# Patient Record
Sex: Female | Born: 1985 | Race: White | Hispanic: No | Smoking: Former smoker
Health system: Southern US, Community
[De-identification: ages and names within clinical notes are randomized; demographics above are authoritative.]

## PROBLEM LIST (undated history)

## (undated) ENCOUNTER — Inpatient Hospital Stay (HOSPITAL_COMMUNITY): Payer: Self-pay

## (undated) DIAGNOSIS — F988 Other specified behavioral and emotional disorders with onset usually occurring in childhood and adolescence: Secondary | ICD-10-CM

## (undated) DIAGNOSIS — Z9889 Other specified postprocedural states: Secondary | ICD-10-CM

## (undated) DIAGNOSIS — J45909 Unspecified asthma, uncomplicated: Secondary | ICD-10-CM

## (undated) DIAGNOSIS — F419 Anxiety disorder, unspecified: Secondary | ICD-10-CM

## (undated) DIAGNOSIS — N3010 Interstitial cystitis (chronic) without hematuria: Secondary | ICD-10-CM

## (undated) DIAGNOSIS — N83209 Unspecified ovarian cyst, unspecified side: Secondary | ICD-10-CM

## (undated) DIAGNOSIS — R748 Abnormal levels of other serum enzymes: Secondary | ICD-10-CM

## (undated) DIAGNOSIS — Z5189 Encounter for other specified aftercare: Secondary | ICD-10-CM

## (undated) DIAGNOSIS — F445 Conversion disorder with seizures or convulsions: Secondary | ICD-10-CM

## (undated) DIAGNOSIS — M810 Age-related osteoporosis without current pathological fracture: Secondary | ICD-10-CM

## (undated) DIAGNOSIS — Z8489 Family history of other specified conditions: Secondary | ICD-10-CM

## (undated) DIAGNOSIS — F329 Major depressive disorder, single episode, unspecified: Secondary | ICD-10-CM

## (undated) DIAGNOSIS — G43909 Migraine, unspecified, not intractable, without status migrainosus: Secondary | ICD-10-CM

## (undated) DIAGNOSIS — D509 Iron deficiency anemia, unspecified: Secondary | ICD-10-CM

## (undated) DIAGNOSIS — G629 Polyneuropathy, unspecified: Secondary | ICD-10-CM

## (undated) DIAGNOSIS — K760 Fatty (change of) liver, not elsewhere classified: Secondary | ICD-10-CM

## (undated) DIAGNOSIS — IMO0002 Reserved for concepts with insufficient information to code with codable children: Secondary | ICD-10-CM

## (undated) DIAGNOSIS — R1031 Right lower quadrant pain: Secondary | ICD-10-CM

## (undated) DIAGNOSIS — K589 Irritable bowel syndrome without diarrhea: Secondary | ICD-10-CM

## (undated) DIAGNOSIS — F1111 Opioid abuse, in remission: Secondary | ICD-10-CM

## (undated) DIAGNOSIS — M5432 Sciatica, left side: Secondary | ICD-10-CM

## (undated) DIAGNOSIS — F439 Reaction to severe stress, unspecified: Secondary | ICD-10-CM

## (undated) DIAGNOSIS — I209 Angina pectoris, unspecified: Secondary | ICD-10-CM

## (undated) DIAGNOSIS — R519 Headache, unspecified: Secondary | ICD-10-CM

## (undated) DIAGNOSIS — R011 Cardiac murmur, unspecified: Secondary | ICD-10-CM

## (undated) DIAGNOSIS — M797 Fibromyalgia: Secondary | ICD-10-CM

## (undated) DIAGNOSIS — I499 Cardiac arrhythmia, unspecified: Secondary | ICD-10-CM

## (undated) DIAGNOSIS — E669 Obesity, unspecified: Secondary | ICD-10-CM

## (undated) DIAGNOSIS — Z9884 Bariatric surgery status: Secondary | ICD-10-CM

## (undated) DIAGNOSIS — R569 Unspecified convulsions: Secondary | ICD-10-CM

## (undated) DIAGNOSIS — E876 Hypokalemia: Secondary | ICD-10-CM

## (undated) DIAGNOSIS — G609 Hereditary and idiopathic neuropathy, unspecified: Secondary | ICD-10-CM

## (undated) DIAGNOSIS — M329 Systemic lupus erythematosus, unspecified: Secondary | ICD-10-CM

## (undated) DIAGNOSIS — F32A Depression, unspecified: Secondary | ICD-10-CM

## (undated) DIAGNOSIS — R112 Nausea with vomiting, unspecified: Secondary | ICD-10-CM

## (undated) DIAGNOSIS — K2970 Gastritis, unspecified, without bleeding: Secondary | ICD-10-CM

## (undated) DIAGNOSIS — IMO0001 Reserved for inherently not codable concepts without codable children: Secondary | ICD-10-CM

## (undated) DIAGNOSIS — Z319 Encounter for procreative management, unspecified: Secondary | ICD-10-CM

## (undated) DIAGNOSIS — Z7151 Drug abuse counseling and surveillance of drug abuser: Secondary | ICD-10-CM

## (undated) DIAGNOSIS — D219 Benign neoplasm of connective and other soft tissue, unspecified: Secondary | ICD-10-CM

## (undated) DIAGNOSIS — N946 Dysmenorrhea, unspecified: Secondary | ICD-10-CM

## (undated) DIAGNOSIS — D649 Anemia, unspecified: Secondary | ICD-10-CM

## (undated) DIAGNOSIS — Z3490 Encounter for supervision of normal pregnancy, unspecified, unspecified trimester: Secondary | ICD-10-CM

## (undated) DIAGNOSIS — R51 Headache: Secondary | ICD-10-CM

## (undated) DIAGNOSIS — N920 Excessive and frequent menstruation with regular cycle: Secondary | ICD-10-CM

## (undated) HISTORY — DX: Anxiety disorder, unspecified: F41.9

## (undated) HISTORY — PX: ESOPHAGOGASTRODUODENOSCOPY: SHX1529

## (undated) HISTORY — DX: Encounter for procreative management, unspecified: Z31.9

## (undated) HISTORY — PX: WISDOM TOOTH EXTRACTION: SHX21

## (undated) HISTORY — DX: Sciatica, left side: M54.32

## (undated) HISTORY — DX: Abnormal levels of other serum enzymes: R74.8

## (undated) HISTORY — DX: Hypokalemia: E87.6

## (undated) HISTORY — DX: Irritable bowel syndrome, unspecified: K58.9

## (undated) HISTORY — DX: Other specified behavioral and emotional disorders with onset usually occurring in childhood and adolescence: F98.8

## (undated) HISTORY — DX: Dysmenorrhea, unspecified: N94.6

## (undated) HISTORY — DX: Migraine, unspecified, not intractable, without status migrainosus: G43.909

## (undated) HISTORY — DX: Depression, unspecified: F32.A

## (undated) HISTORY — DX: Polyneuropathy, unspecified: G62.9

## (undated) HISTORY — DX: Obesity, unspecified: E66.9

## (undated) HISTORY — DX: Iron deficiency anemia, unspecified: D50.9

## (undated) HISTORY — DX: Hereditary and idiopathic neuropathy, unspecified: G60.9

## (undated) HISTORY — DX: Reserved for inherently not codable concepts without codable children: IMO0001

## (undated) HISTORY — DX: Fibromyalgia: M79.7

## (undated) HISTORY — PX: COLONOSCOPY: SHX174

## (undated) HISTORY — PX: OTHER SURGICAL HISTORY: SHX169

## (undated) HISTORY — DX: Excessive and frequent menstruation with regular cycle: N92.0

## (undated) HISTORY — DX: Right lower quadrant pain: R10.31

## (undated) HISTORY — DX: Interstitial cystitis (chronic) without hematuria: N30.10

## (undated) HISTORY — DX: Gastritis, unspecified, without bleeding: K29.70

## (undated) HISTORY — PX: UPPER GASTROINTESTINAL ENDOSCOPY: SHX188

## (undated) HISTORY — PX: TONSILLECTOMY AND ADENOIDECTOMY: SUR1326

## (undated) HISTORY — DX: Major depressive disorder, single episode, unspecified: F32.9

## (undated) HISTORY — DX: Fatty (change of) liver, not elsewhere classified: K76.0

## (undated) HISTORY — DX: Encounter for supervision of normal pregnancy, unspecified, unspecified trimester: Z34.90

## (undated) HISTORY — DX: Reaction to severe stress, unspecified: F43.9

## (undated) HISTORY — PX: TONSILLECTOMY: SUR1361

## (undated) HISTORY — PX: DILATION AND CURETTAGE OF UTERUS: SHX78

## (undated) HISTORY — DX: Benign neoplasm of connective and other soft tissue, unspecified: D21.9

## (undated) HISTORY — DX: Anemia, unspecified: D64.9

---
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
## 2006-06-02 ENCOUNTER — Emergency Department (HOSPITAL_COMMUNITY): Admission: EM | Admit: 2006-06-02 | Discharge: 2006-06-02 | Payer: Self-pay | Admitting: Emergency Medicine

## 2006-06-05 HISTORY — PX: GASTRIC BYPASS: SHX52

## 2007-06-16 ENCOUNTER — Encounter: Payer: Self-pay | Admitting: Obstetrics and Gynecology

## 2007-06-16 ENCOUNTER — Ambulatory Visit (HOSPITAL_COMMUNITY): Admission: RE | Admit: 2007-06-16 | Discharge: 2007-06-16 | Payer: Self-pay | Admitting: Obstetrics and Gynecology

## 2007-12-18 ENCOUNTER — Ambulatory Visit (HOSPITAL_BASED_OUTPATIENT_CLINIC_OR_DEPARTMENT_OTHER): Admission: RE | Admit: 2007-12-18 | Discharge: 2007-12-18 | Payer: Self-pay | Admitting: Urology

## 2008-05-21 IMAGING — CT CT PELVIS W/O CM
1 of 2 series · 15 of 32 positions shown, 19 images · non-contrast
Comparison: none

HISTORY: Left-sided abdominal pain

[Series 6134: — · axial · 0.79mm/px · z∈[+1250,+1750]mm · 15 of 110 slices shown, 19 images]
[im 5/110  soft-tissue]
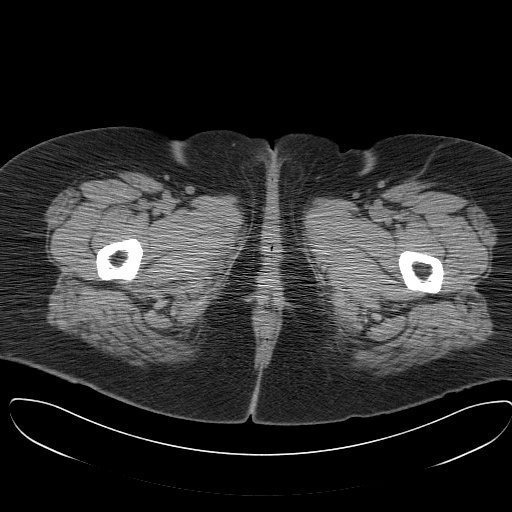
[im 5/110  bone]
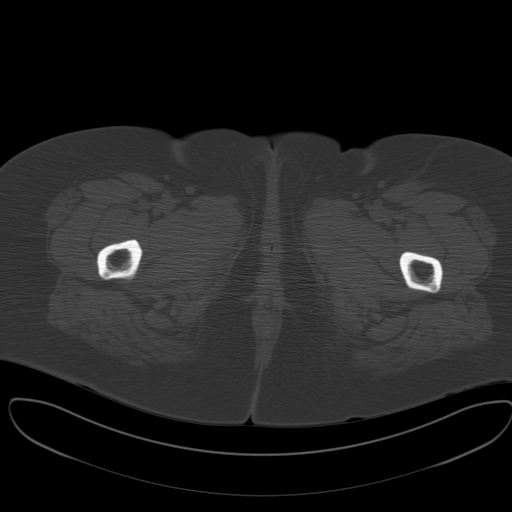
[im 14/110  soft-tissue]
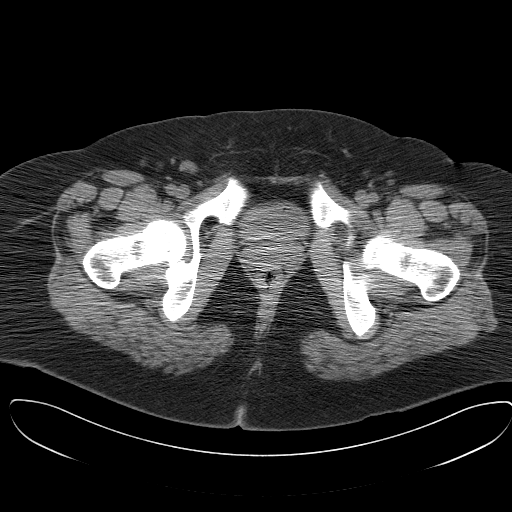
[im 22/110  soft-tissue]
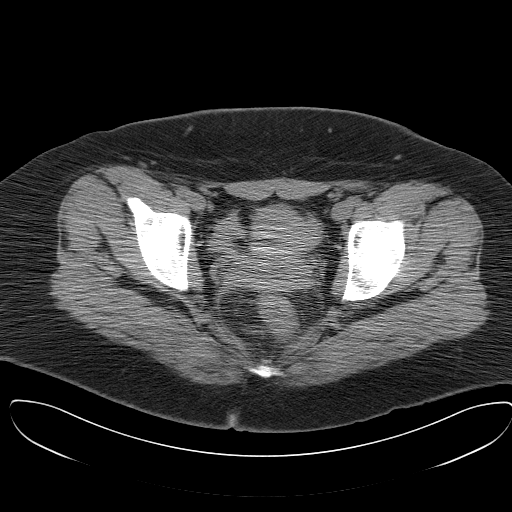
[im 31/110  soft-tissue]
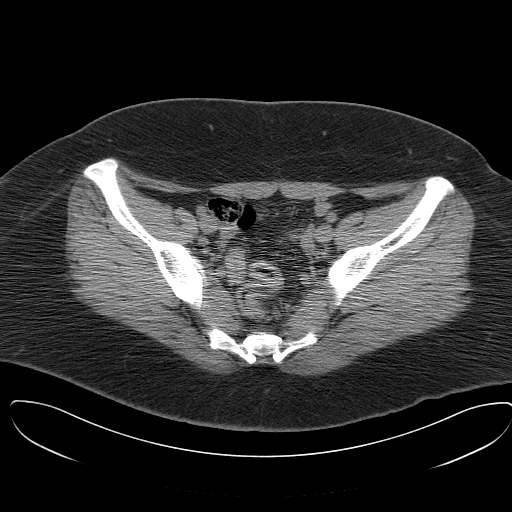
[im 40/110  soft-tissue]
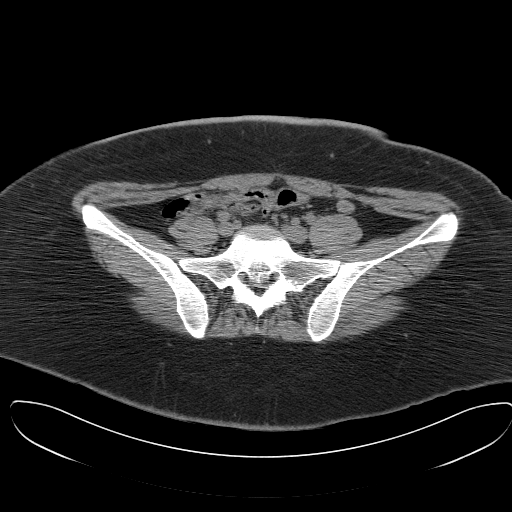
[im 48/110  soft-tissue]
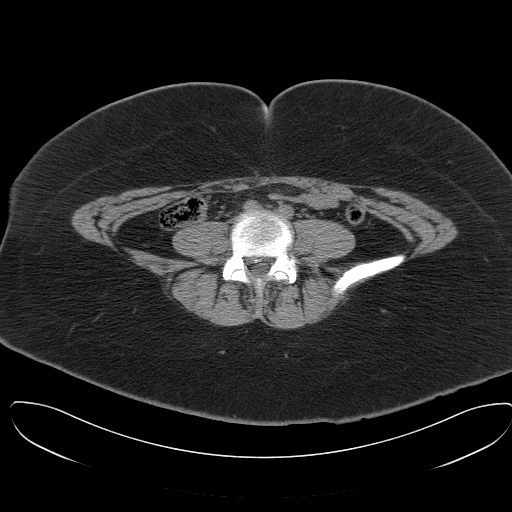
[im 57/110  soft-tissue]
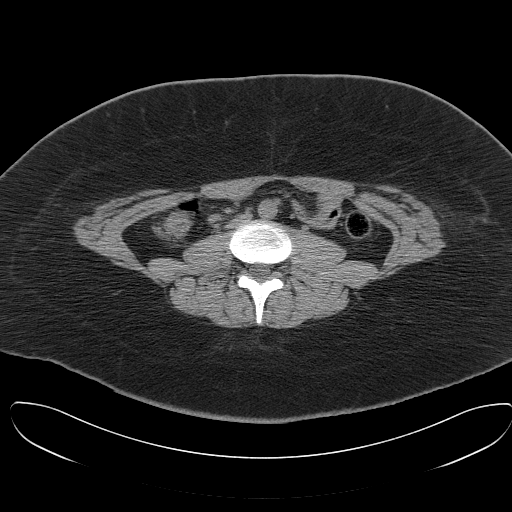
[im 62/110  soft-tissue]
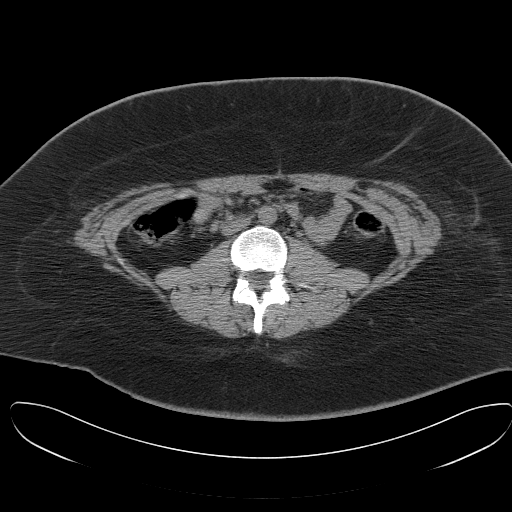
[im 70/110  soft-tissue]
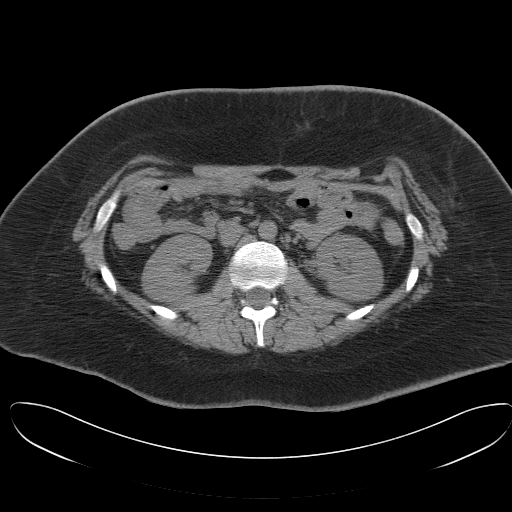
[im 70/110  bone]
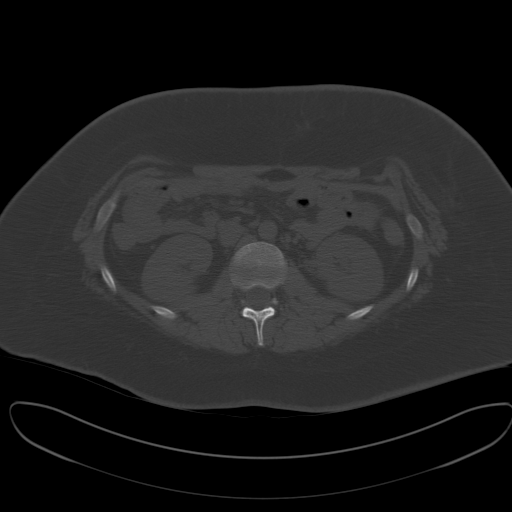
[im 79/110  soft-tissue]
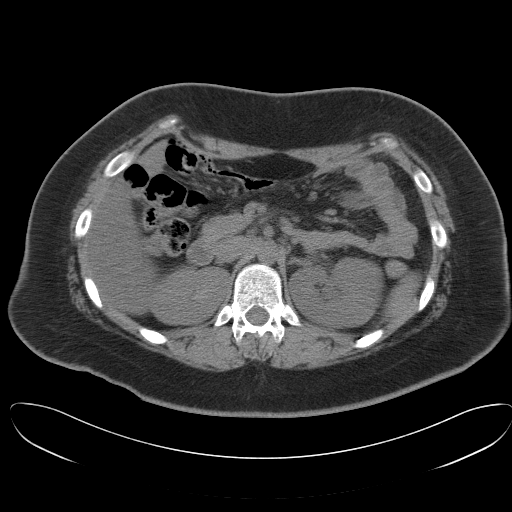
[im 88/110  soft-tissue]
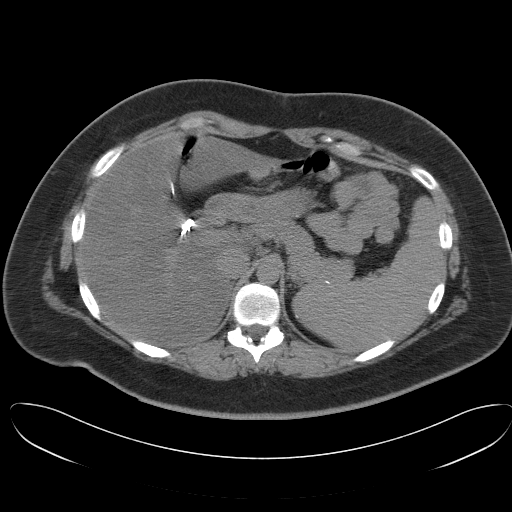
[im 92/110  lung]
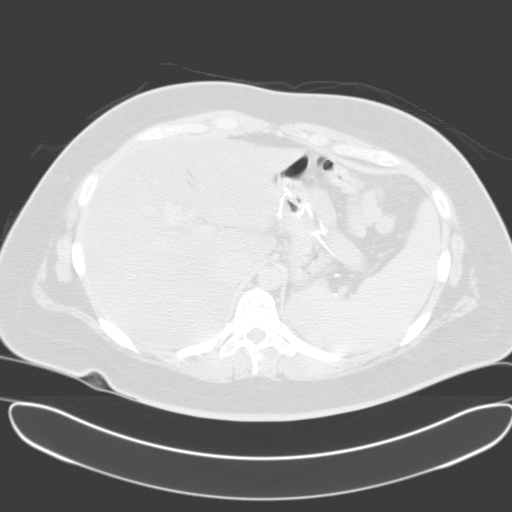
[im 96/110  soft-tissue]
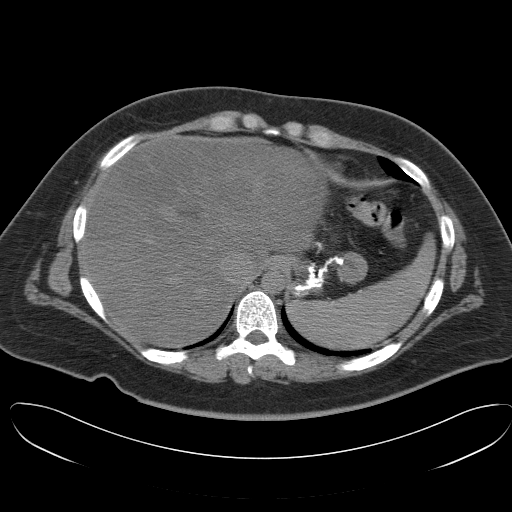
[im 96/110  lung]
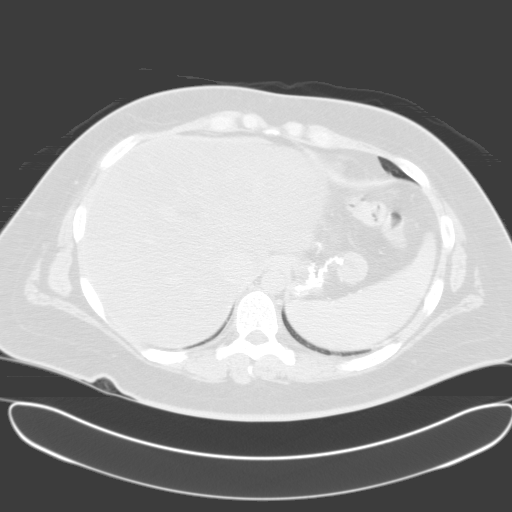
[im 101/110  lung]
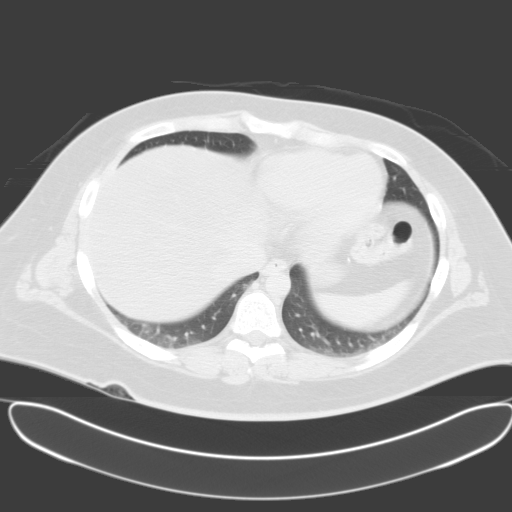
[im 105/110  soft-tissue]
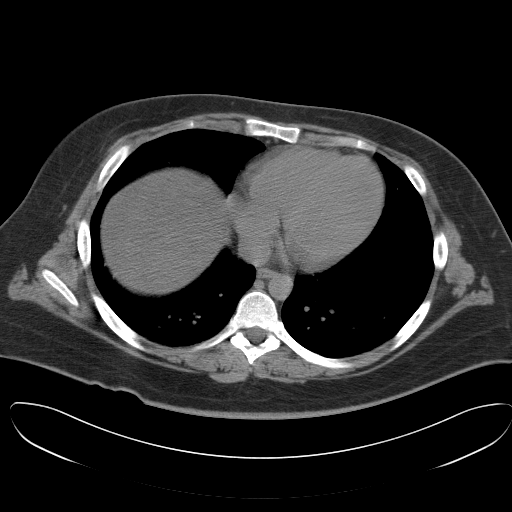
[im 105/110  lung]
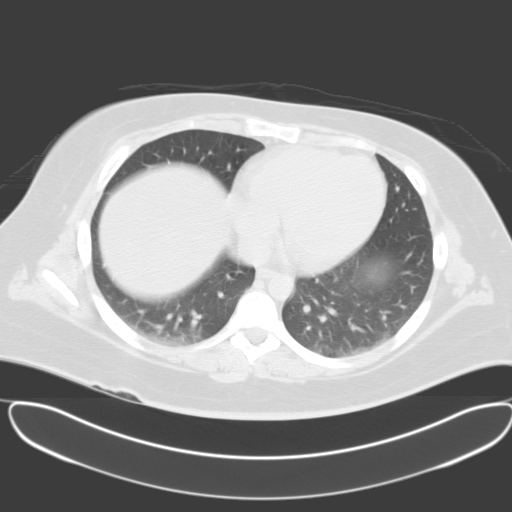

[15 of 32 positions shown; findings below may reference images not displayed]

CT ABDOMEN AND PELVIS WITHOUT CONTRAST:

Multidetector helical CT imaging of abdomen and pelvis performed using kidney
stone protocol. 
Neither oral nor intravenous contrast utilized for this indication.
No prior exam for comparison.

CT ABDOMEN:

Minimal dependent density lung bases.
Status post cholecystectomy and gastric surgery, by history gastric bypass February 2006.
No calculi in either kidney.
Mild left hydronephrosis, left renal enlargement, and mild left perinephric
edema present.
No ureteral calcification seen.
Mild fatty infiltration of liver.
Spleen minimally prominent, 14.6 x 6.9 cm in greatest axial dimensions and
extending for 12 cm in length.
Central low attenuation liver, poorly defined, approximately 2.7 x 1.7 cm image
17, measuring less than 0 Hounsfield units, consistent with more severe focal
fatty infiltration.
Pancreas, adrenal glands, and right kidney unremarkable.
No upper abdominal mass or additional inflammatory process.
Numerous mildly prominent lymph nodes seen throughout mesentery in upper mid
abdomen, none pathologically enlarged.
No ascites or focal bone lesion.
IMPRESSION: Mild left renal enlargement with mild hydronephrosis and minimal peripelvic
edema suggesting obstruction though no definite calculus visualized.
Correlation with urinalysis recommended.
Fatty infiltration of liver with more focal area of central fatty deposition
near porta hepatis.
Status post cholecystectomy and gastric bypass.
Multiple lymph nodes throughout mesentery, normal in size but prominent in
number.

CT PELVIS:

No definite distal ureteral calcification or dilatation.
Appendix not localized.
Normal size and appearance of uterus and ovaries by CT.
No pelvic mass, adenopathy, or free fluid.
Bones unremarkable.
IMPRESSION: No acute pelvic abnormalities. 
Specifically, no distal ureteral calcification identified to account for mild
left hydronephrosis visualized on CT abdomen.
Possibility of passed stone or urinary tract infection raised.

## 2008-05-30 ENCOUNTER — Other Ambulatory Visit: Admission: RE | Admit: 2008-05-30 | Discharge: 2008-05-30 | Payer: Self-pay | Admitting: Obstetrics and Gynecology

## 2008-09-14 ENCOUNTER — Ambulatory Visit: Payer: Self-pay | Admitting: Obstetrics & Gynecology

## 2008-09-14 ENCOUNTER — Inpatient Hospital Stay (HOSPITAL_COMMUNITY): Admission: AD | Admit: 2008-09-14 | Discharge: 2008-09-15 | Payer: Self-pay | Admitting: Obstetrics & Gynecology

## 2008-09-24 ENCOUNTER — Encounter (HOSPITAL_COMMUNITY): Admission: RE | Admit: 2008-09-24 | Discharge: 2008-10-24 | Payer: Self-pay | Admitting: Obstetrics and Gynecology

## 2008-09-24 ENCOUNTER — Ambulatory Visit (HOSPITAL_COMMUNITY): Payer: Self-pay | Admitting: Obstetrics and Gynecology

## 2008-11-14 ENCOUNTER — Ambulatory Visit (HOSPITAL_COMMUNITY): Admission: RE | Admit: 2008-11-14 | Discharge: 2008-11-14 | Payer: Self-pay | Admitting: Obstetrics and Gynecology

## 2008-12-06 ENCOUNTER — Ambulatory Visit: Payer: Self-pay | Admitting: Obstetrics and Gynecology

## 2008-12-06 ENCOUNTER — Inpatient Hospital Stay (HOSPITAL_COMMUNITY): Admission: AD | Admit: 2008-12-06 | Discharge: 2008-12-06 | Payer: Self-pay | Admitting: Obstetrics & Gynecology

## 2008-12-21 ENCOUNTER — Ambulatory Visit: Payer: Self-pay | Admitting: Family Medicine

## 2008-12-31 ENCOUNTER — Inpatient Hospital Stay (HOSPITAL_COMMUNITY): Admission: AD | Admit: 2008-12-31 | Discharge: 2009-01-03 | Payer: Self-pay | Admitting: Obstetrics & Gynecology

## 2008-12-31 ENCOUNTER — Ambulatory Visit: Payer: Self-pay | Admitting: Family Medicine

## 2009-01-06 DIAGNOSIS — IMO0001 Reserved for inherently not codable concepts without codable children: Secondary | ICD-10-CM

## 2009-01-06 HISTORY — DX: Reserved for inherently not codable concepts without codable children: IMO0001

## 2009-06-29 ENCOUNTER — Emergency Department (HOSPITAL_COMMUNITY): Admission: EM | Admit: 2009-06-29 | Discharge: 2009-06-29 | Payer: Self-pay | Admitting: Emergency Medicine

## 2009-07-07 ENCOUNTER — Ambulatory Visit (HOSPITAL_COMMUNITY): Payer: Self-pay | Admitting: Family Medicine

## 2009-07-07 ENCOUNTER — Encounter (HOSPITAL_COMMUNITY): Admission: RE | Admit: 2009-07-07 | Discharge: 2009-08-06 | Payer: Self-pay | Admitting: Family Medicine

## 2009-10-19 ENCOUNTER — Emergency Department (HOSPITAL_COMMUNITY): Admission: EM | Admit: 2009-10-19 | Discharge: 2009-10-20 | Payer: Self-pay | Admitting: Emergency Medicine

## 2009-10-27 ENCOUNTER — Ambulatory Visit (HOSPITAL_COMMUNITY): Admission: RE | Admit: 2009-10-27 | Discharge: 2009-10-27 | Payer: Self-pay | Admitting: Obstetrics & Gynecology

## 2009-11-30 ENCOUNTER — Emergency Department (HOSPITAL_COMMUNITY): Admission: EM | Admit: 2009-11-30 | Discharge: 2009-11-30 | Payer: Self-pay | Admitting: Emergency Medicine

## 2009-12-06 DIAGNOSIS — D649 Anemia, unspecified: Secondary | ICD-10-CM

## 2009-12-06 HISTORY — DX: Anemia, unspecified: D64.9

## 2009-12-17 IMAGING — US US TRANSVAGINAL NON-OB
1 series · 14 of 25 positions shown · non-contrast
Comparison: CT 06/02/2006.

October 28, 2009 –DUPLICATE COPY for exam association in [facility]. No change from original report.
CLINICAL DATA: Pelvic pain.

 TRANSABDOMINAL AND TRANSVAGINAL ULTRASOUND OF PELVIS
TECHNIQUE: Both transabdominal and transvaginal ultrasound
 examinations of the pelvis were performed including evaluation of
 the uterus, ovaries, adnexal regions, and pelvic cul-de-sac.

[Series 1: us transvaginal non-ob · 0.22mm/px · 14 of 62 slices shown]
[im 1/62]
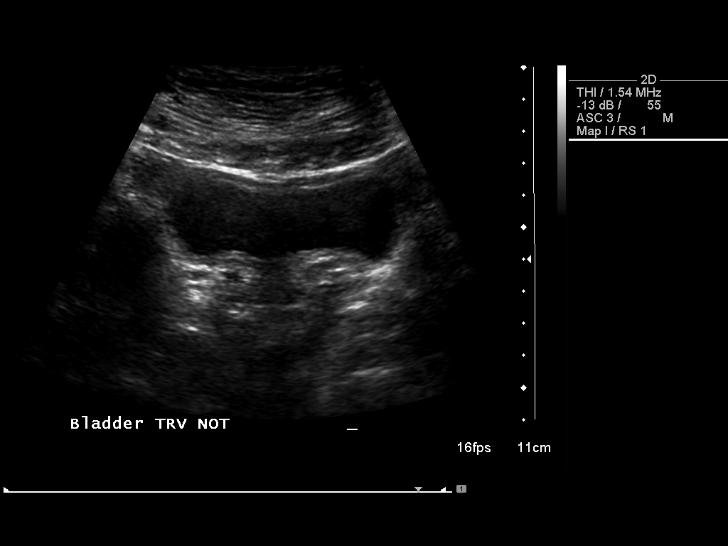
[im 6/62]
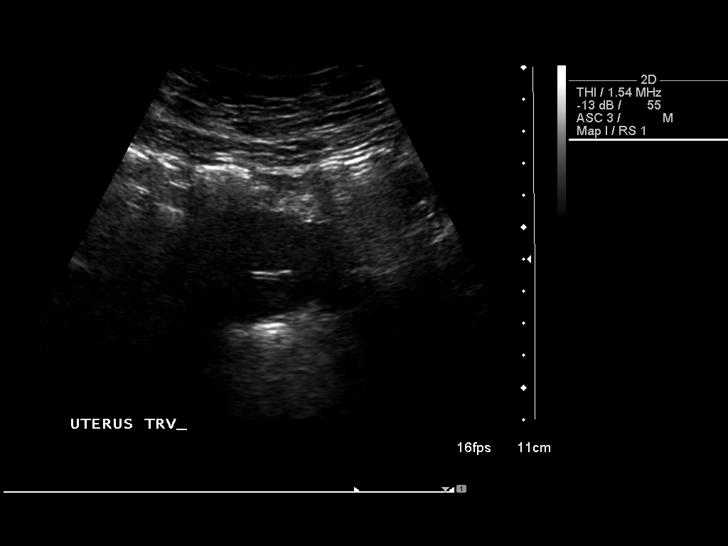
[im 11/62]
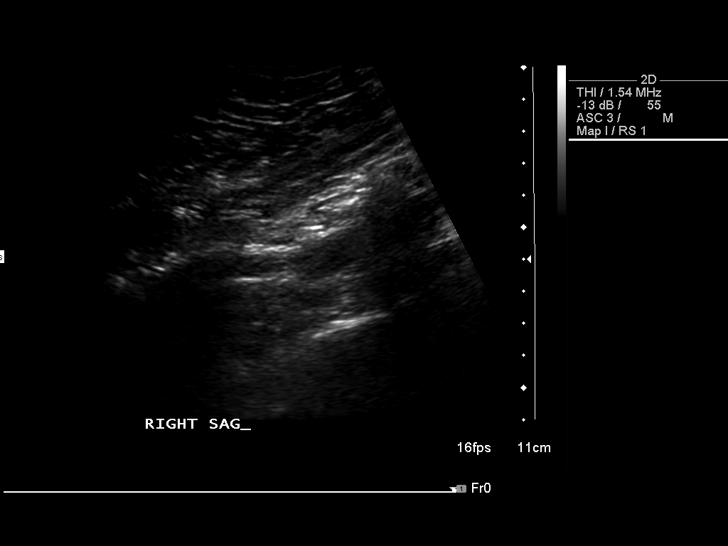
[im 16/62]
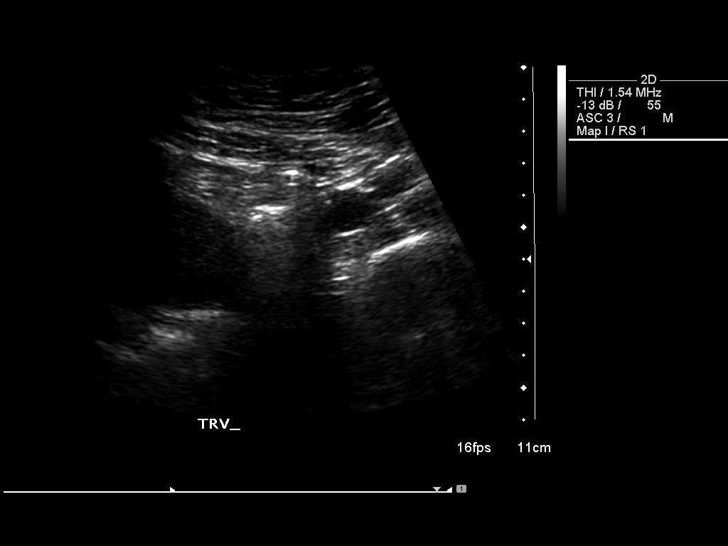
[im 21/62]
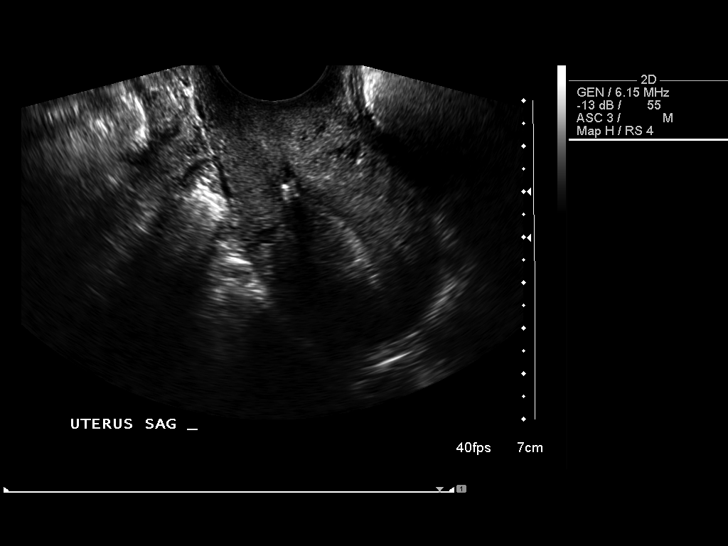
[im 23/62]
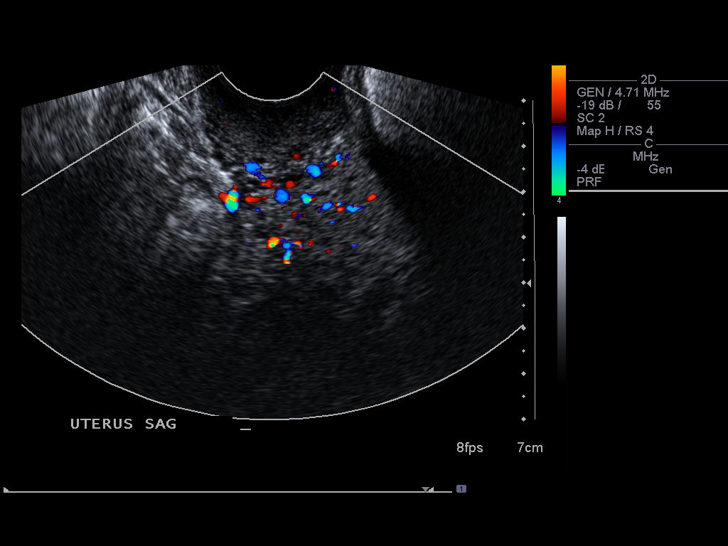
[im 28/62]
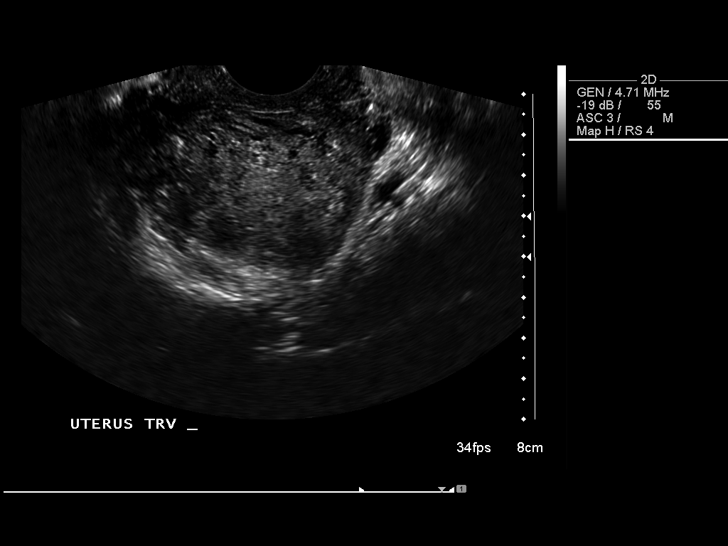
[im 34/62]
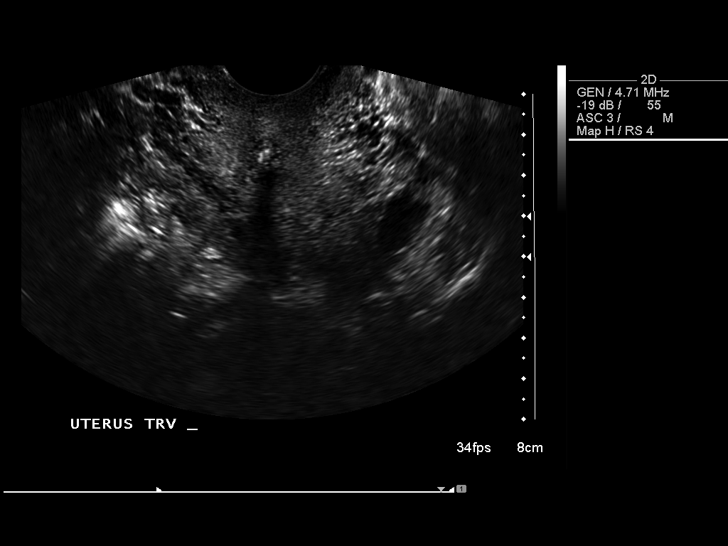
[im 39/62]
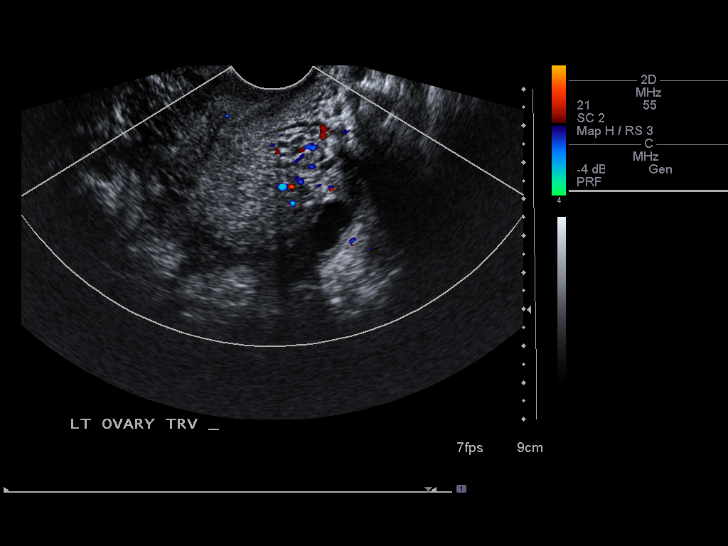
[im 41/62]
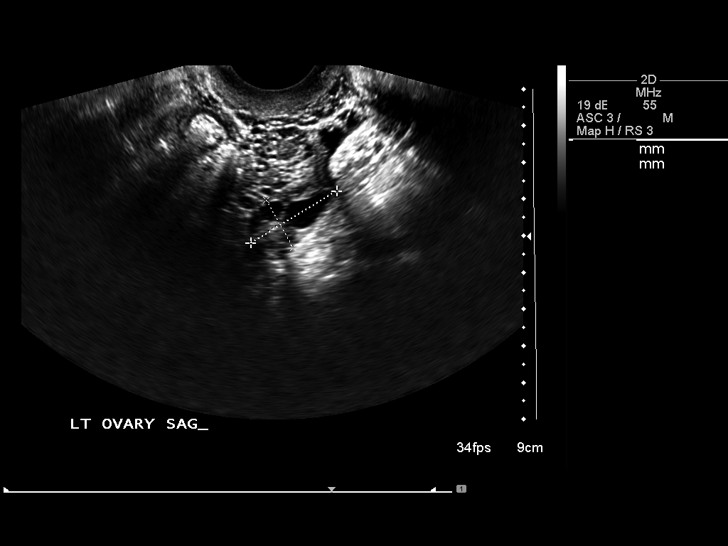
[im 46/62]
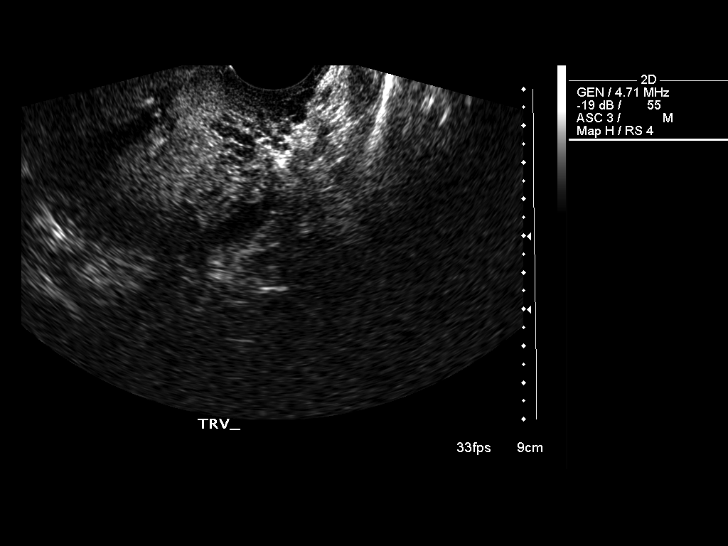
[im 51/62]
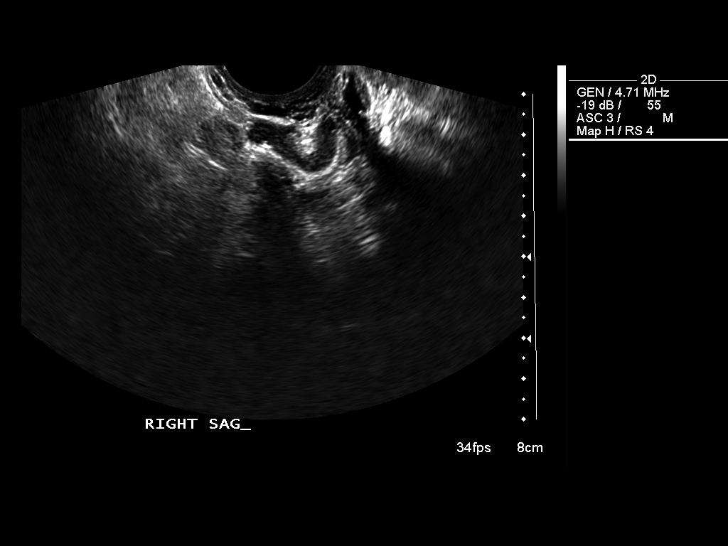
[im 56/62]
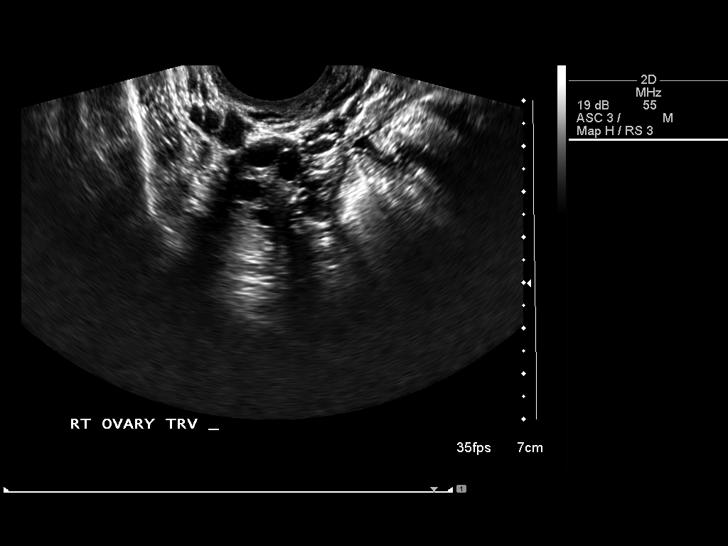
[im 62/62]
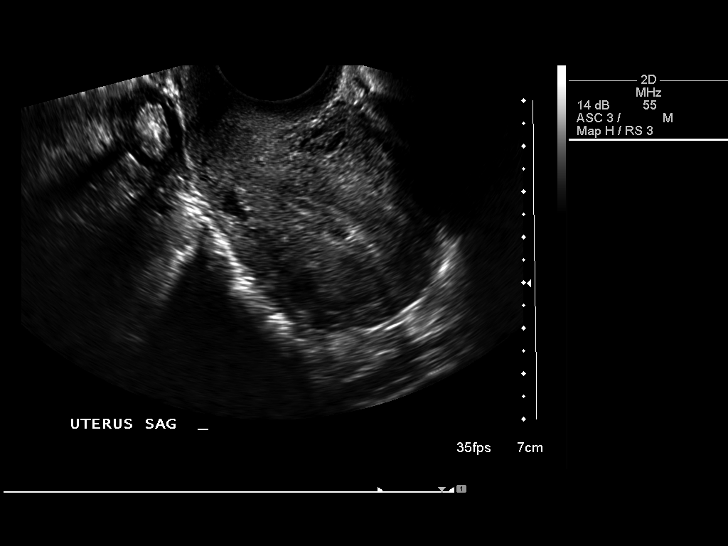

[14 of 25 positions shown; findings below may reference images not displayed]

FINDINGS: Uterus retroverted uterus measures 7.0 x 3.4 x 4.5 cm. IUD is
 noted and shadows within the uterine fundus.

 Endometrium 7 mm in diameter without focal mass.

 Right Ovary 3.1 x 1.9 x 1.7 cm with multiple follicles. No mass
 lesion.

 Left Ovary 2.7 x 1.5 x 1.7 cm with follicles. Negative for mass.

 Other Findings: No free fluid.
IMPRESSION: IUD is present. There is no mass or acute abnormality.

## 2010-01-07 ENCOUNTER — Emergency Department (HOSPITAL_COMMUNITY): Admission: EM | Admit: 2010-01-07 | Discharge: 2010-01-07 | Payer: Self-pay | Admitting: Emergency Medicine

## 2010-01-09 ENCOUNTER — Ambulatory Visit (HOSPITAL_COMMUNITY): Admission: RE | Admit: 2010-01-09 | Discharge: 2010-01-09 | Payer: Self-pay | Admitting: Family Medicine

## 2010-05-18 ENCOUNTER — Emergency Department (HOSPITAL_COMMUNITY): Admission: EM | Admit: 2010-05-18 | Discharge: 2010-05-18 | Payer: Self-pay | Admitting: Emergency Medicine

## 2010-06-03 ENCOUNTER — Emergency Department (HOSPITAL_COMMUNITY): Admission: EM | Admit: 2010-06-03 | Discharge: 2010-06-04 | Payer: Self-pay | Admitting: Emergency Medicine

## 2010-06-05 DIAGNOSIS — K2970 Gastritis, unspecified, without bleeding: Secondary | ICD-10-CM

## 2010-06-05 HISTORY — DX: Gastritis, unspecified, without bleeding: K29.70

## 2010-06-11 ENCOUNTER — Emergency Department (HOSPITAL_COMMUNITY): Admission: EM | Admit: 2010-06-11 | Discharge: 2010-06-11 | Payer: Self-pay | Admitting: Emergency Medicine

## 2010-06-12 ENCOUNTER — Encounter (HOSPITAL_COMMUNITY): Admission: RE | Admit: 2010-06-12 | Discharge: 2010-07-12 | Payer: Self-pay | Admitting: Oncology

## 2010-07-01 ENCOUNTER — Observation Stay (HOSPITAL_COMMUNITY): Admission: EM | Admit: 2010-07-01 | Discharge: 2010-07-02 | Payer: Self-pay | Admitting: Emergency Medicine

## 2010-07-01 ENCOUNTER — Ambulatory Visit: Payer: Self-pay | Admitting: Gastroenterology

## 2010-07-02 ENCOUNTER — Ambulatory Visit: Payer: Self-pay | Admitting: Gastroenterology

## 2010-07-02 ENCOUNTER — Encounter (INDEPENDENT_AMBULATORY_CARE_PROVIDER_SITE_OTHER): Payer: Self-pay | Admitting: Internal Medicine

## 2010-07-08 ENCOUNTER — Telehealth: Payer: Self-pay | Admitting: Gastroenterology

## 2010-07-09 ENCOUNTER — Encounter: Payer: Self-pay | Admitting: Gastroenterology

## 2010-07-17 ENCOUNTER — Encounter: Payer: Self-pay | Admitting: Gastroenterology

## 2010-07-20 ENCOUNTER — Encounter: Payer: Self-pay | Admitting: Gastroenterology

## 2010-07-20 ENCOUNTER — Emergency Department (HOSPITAL_COMMUNITY): Admission: EM | Admit: 2010-07-20 | Discharge: 2010-07-20 | Payer: Self-pay | Admitting: Emergency Medicine

## 2010-07-21 ENCOUNTER — Emergency Department (HOSPITAL_COMMUNITY): Admission: EM | Admit: 2010-07-21 | Discharge: 2010-07-21 | Payer: Self-pay | Admitting: Emergency Medicine

## 2010-07-23 ENCOUNTER — Encounter: Payer: Self-pay | Admitting: Gastroenterology

## 2010-07-23 DIAGNOSIS — D509 Iron deficiency anemia, unspecified: Secondary | ICD-10-CM

## 2010-07-23 HISTORY — DX: Iron deficiency anemia, unspecified: D50.9

## 2010-07-28 ENCOUNTER — Ambulatory Visit (HOSPITAL_COMMUNITY): Admission: RE | Admit: 2010-07-28 | Discharge: 2010-07-28 | Payer: Self-pay | Admitting: Family Medicine

## 2010-08-20 IMAGING — CR DG CHEST 1V
1 series · 1 of 1 positions shown · non-contrast
Comparison: None

CLINICAL DATA: Unresponsive.  Altered mental status.

CHEST - 1 VIEW

[view not recorded]
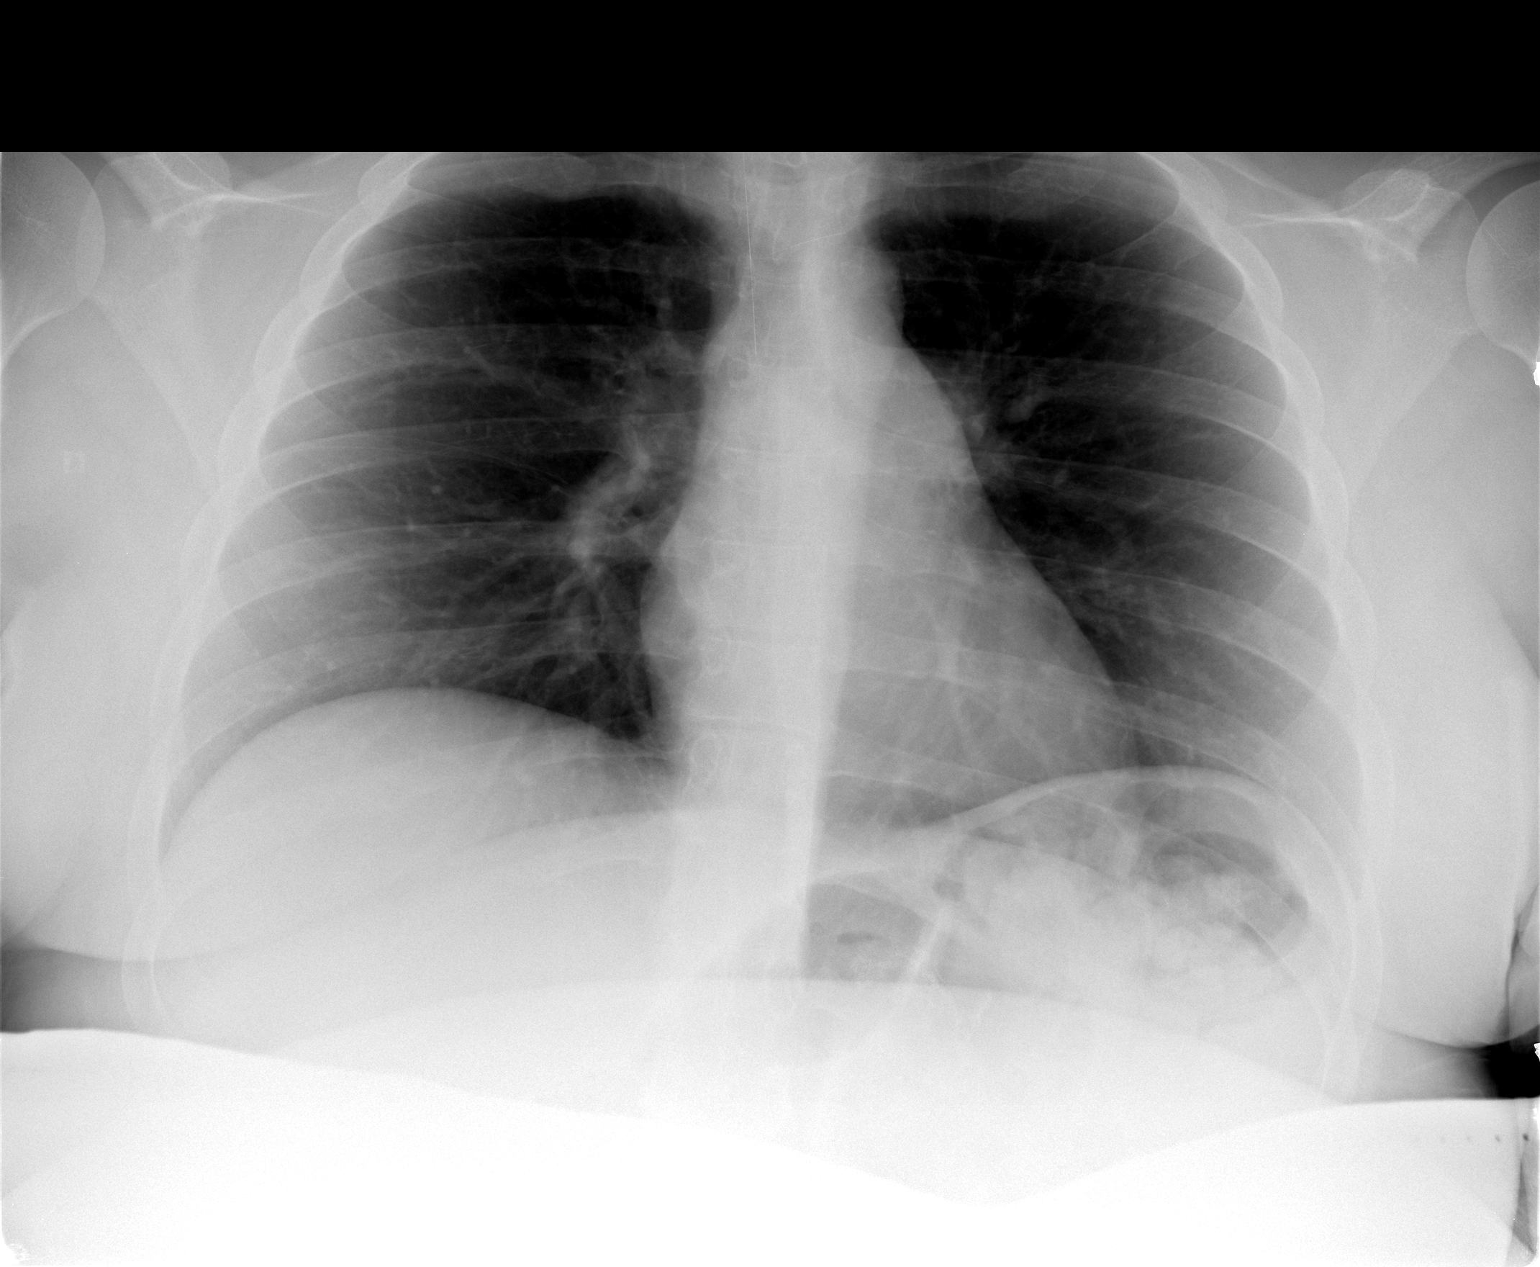

[1 of 1 positions shown; findings below may reference images not displayed]

FINDINGS: Heart size is normal.  Mediastinal shadows are normal.
Lungs are clear.  The vascularity is normal.  No effusions.  No
bony abnormalities.
IMPRESSION: No active disease

## 2010-08-21 IMAGING — MR MR ABDOMEN WO/W CM
9 of 15 series · 23 of 48 positions shown · IV contrast (multihance)
Comparison: CT abdomen 05/25/2006, ultrasound abdomen 07/01/2010.

CLINICAL DATA: Elevated liver enzymes.  Question renal lesion.

MRI ABDOMEN WITH AND WITHOUT CONTRAST,WORKSTATION 3D RECONSTRUCTION
TECHNIQUE: Multiplanar multisequence MR imaging of the abdomen was
performed both before and after administration of intravenous
contrast.,
Contrast: 20 ml multihance

[Series 2: coronal haste · coronal · 5.0mm · 0.78mm/px · 2 of 35 slices shown]
[im 1/35]
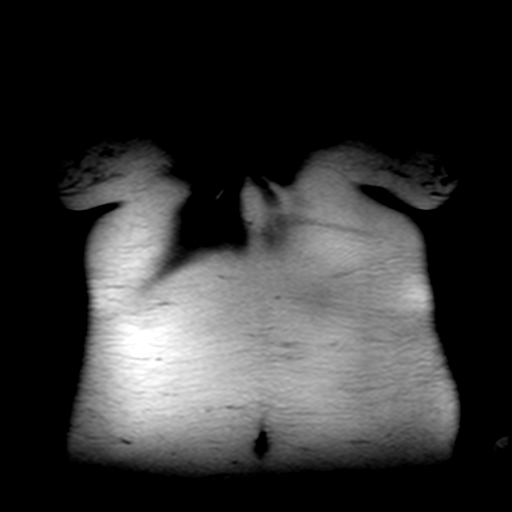
[im 35/35]
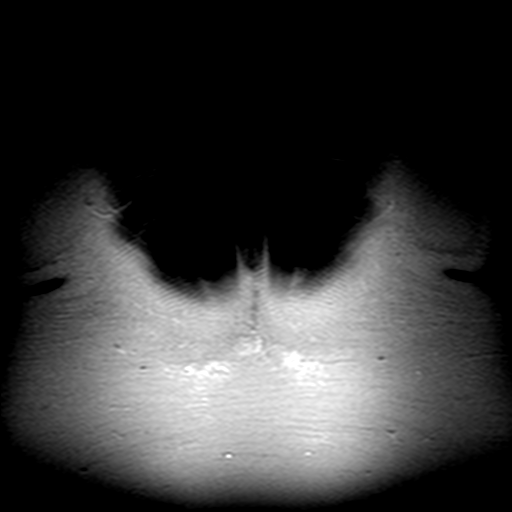

[Series 3: axial haste · axial · 5.0mm · 0.74mm/px · z∈[-46,+213]mm · 2 of 48 slices shown]
[im 1/48]
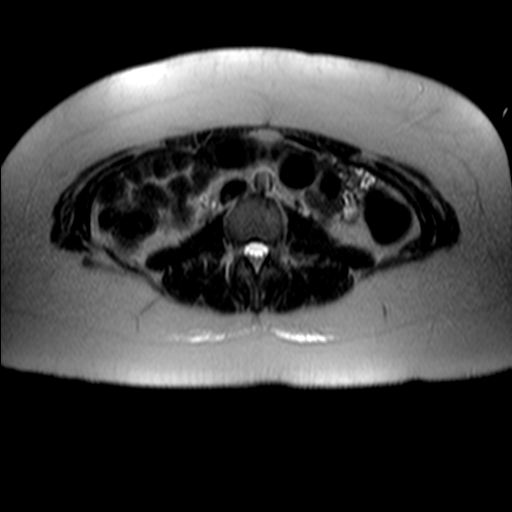
[im 48/48]
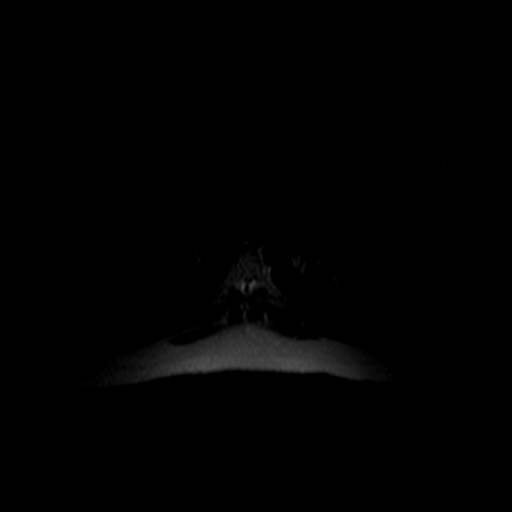

[Series 4: coronal ir · coronal · 5.0mm · 0.70mm/px · 1 of 30 slices shown]
[im 1/30]
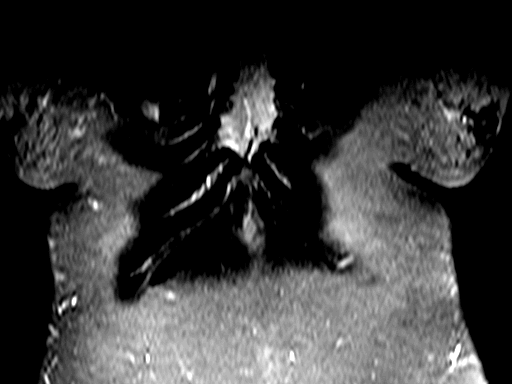

[Series 5: t2fs tse axial · axial · 4.0mm · 0.74mm/px · z∈[-47,+213]mm · 2 of 51 slices shown]
[im 1/51]
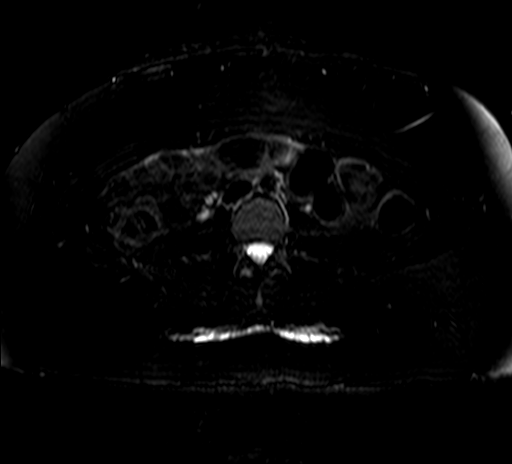
[im 51/51]
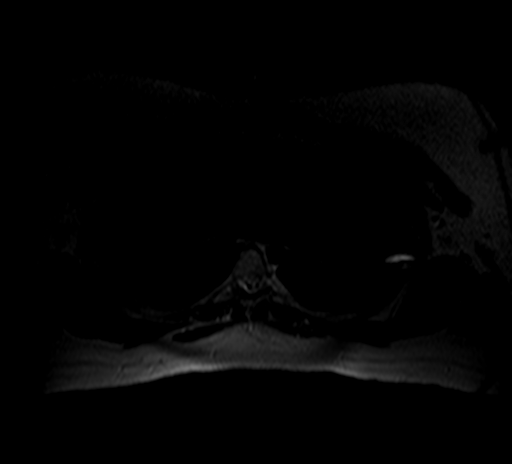

[Series 6: T1 · axial · 6.0mm · 0.55mm/px · z∈[-50,+221]mm · 3 of 80 slices shown]
[im 1/80]
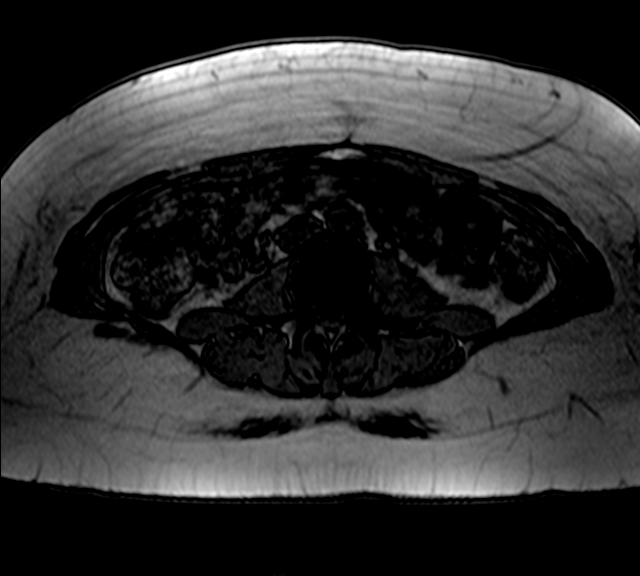
[im 40/80]
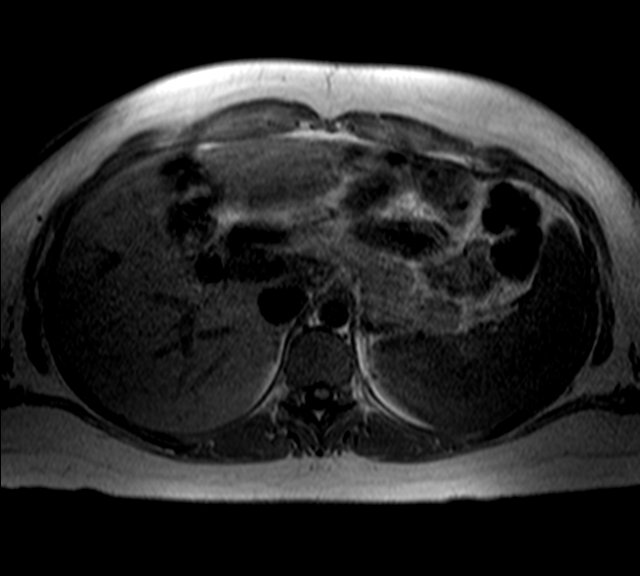
[im 80/80]
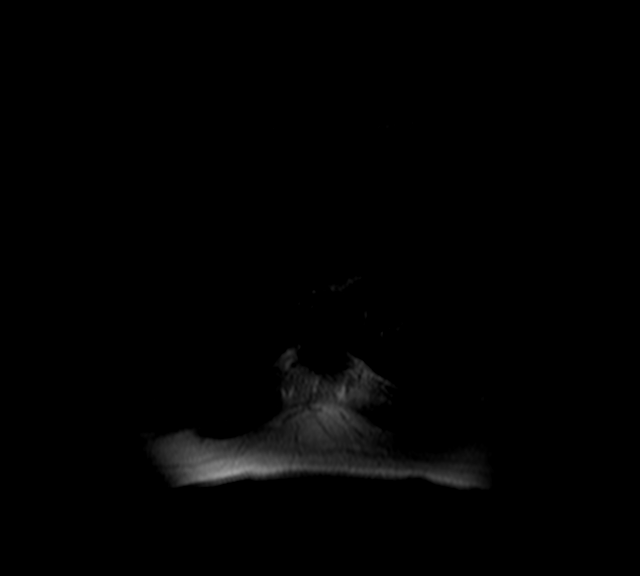

[Series 13: dyn post 23s · axial · 2.0mm · 0.78mm/px · z∈[-43,+211]mm · 4 of 128 slices shown]
[im 1/128]
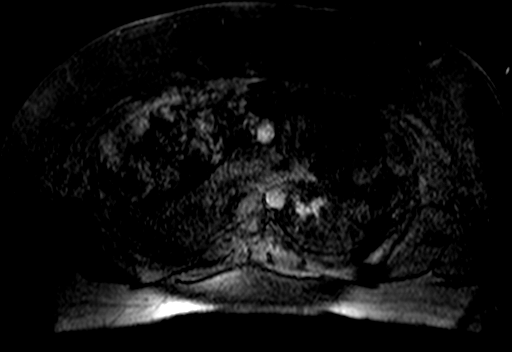
[im 43/128]
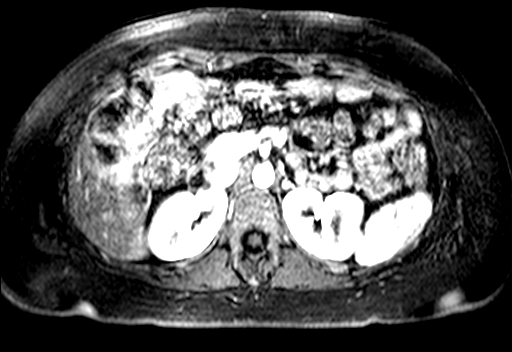
[im 85/128]
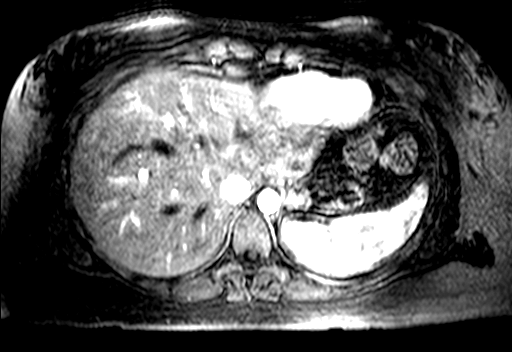
[im 128/128]
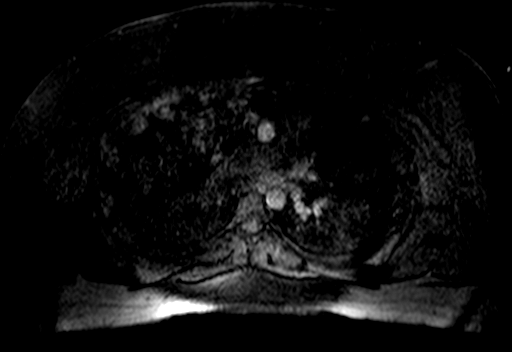

[Series 14: dyn post 45s · axial · 2.0mm · 0.78mm/px · z∈[-43,+211]mm · 4 of 128 slices shown]
[im 1/128]
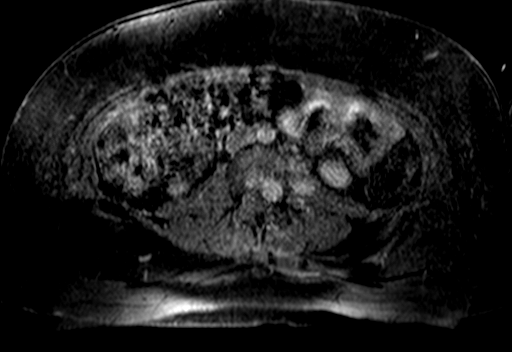
[im 43/128]
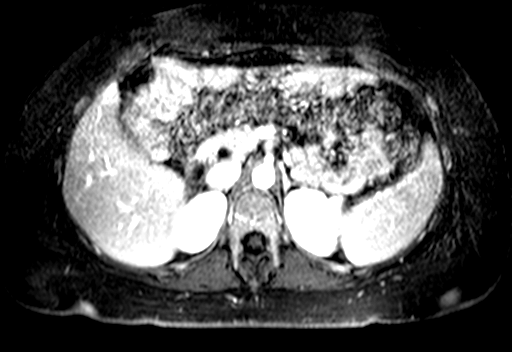
[im 85/128]
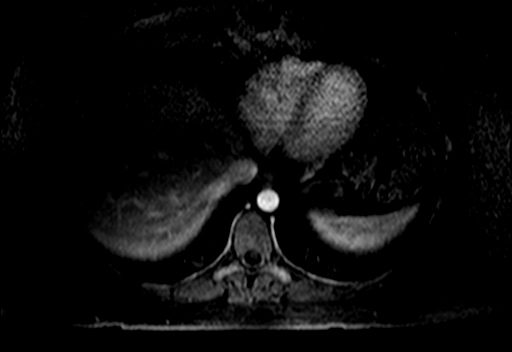
[im 128/128]
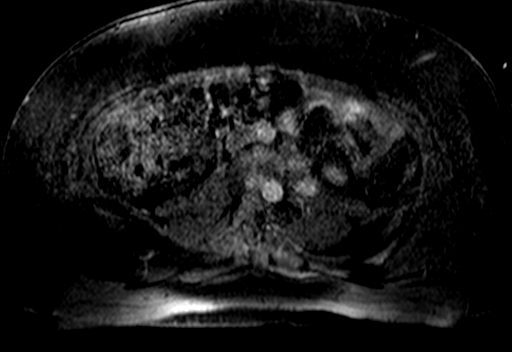

[Series 15: dyn post 90s · axial · 2.0mm · 0.78mm/px · z∈[-43,+211]mm · 4 of 128 slices shown]
[im 1/128]
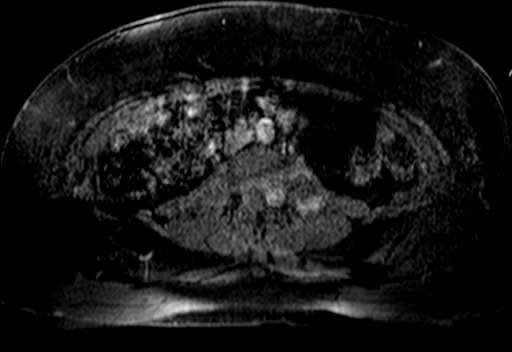
[im 43/128]
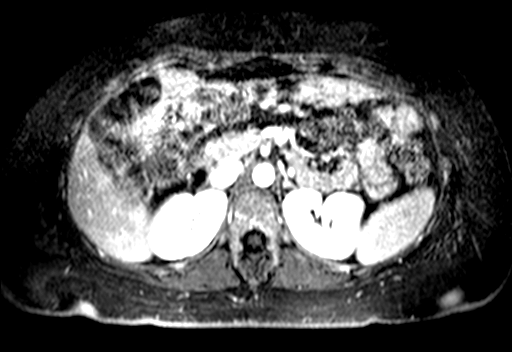
[im 85/128]
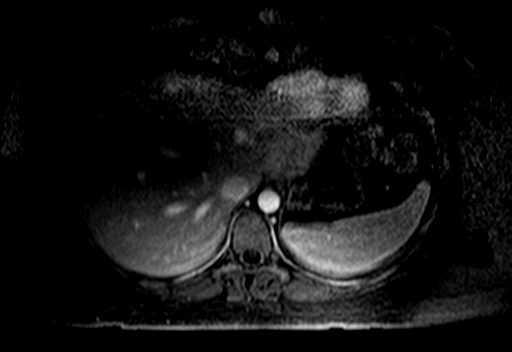
[im 128/128]
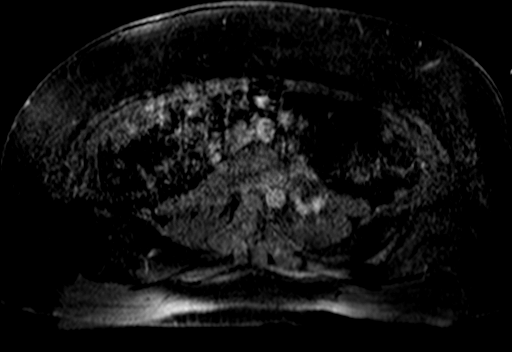

[Series 16: dyn post 3min · axial · 2.0mm · 0.78mm/px · 1 of 128 slices shown]
[im 1/128]
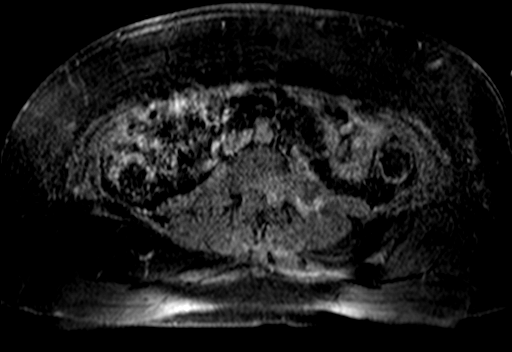

[23 of 48 positions shown; findings below may reference images not displayed]

FINDINGS: No evidence of pleural fluid or pericardial fluid.

No focal hepatic lesion.  No evidence ductal dilatation.  There is
minimal fatty infiltration of the liver.  MRCP imaging demonstrates
no intrahepatic ductal dilatation.  The common bile duct appears
normal without evidence of filling defect or stricture.  The
pancreatic duct is normal.

The pancreas is normal.  The spleen and adrenal glands appear
normal.  There is no lesion within the right kidney to correspond
to the ultrasound finding.  This may represent renal sinus fat.  No
abdominal lymphadenopathy.
IMPRESSION: 1.  No significant abnormality within the abdomen by MRI.
2.  Minimal fatty infiltration of the liver.
3.  No right renal lesion identified.

## 2010-09-03 ENCOUNTER — Encounter (INDEPENDENT_AMBULATORY_CARE_PROVIDER_SITE_OTHER): Payer: Self-pay | Admitting: *Deleted

## 2010-09-13 ENCOUNTER — Emergency Department (HOSPITAL_COMMUNITY): Admission: EM | Admit: 2010-09-13 | Discharge: 2010-09-13 | Payer: Self-pay | Admitting: Emergency Medicine

## 2010-09-17 ENCOUNTER — Ambulatory Visit: Payer: Self-pay | Admitting: Gastroenterology

## 2010-09-17 DIAGNOSIS — R7401 Elevation of levels of liver transaminase levels: Secondary | ICD-10-CM | POA: Insufficient documentation

## 2010-09-17 DIAGNOSIS — R740 Nonspecific elevation of levels of transaminase and lactic acid dehydrogenase [LDH]: Secondary | ICD-10-CM

## 2010-09-17 DIAGNOSIS — E669 Obesity, unspecified: Secondary | ICD-10-CM | POA: Insufficient documentation

## 2010-09-18 ENCOUNTER — Telehealth (INDEPENDENT_AMBULATORY_CARE_PROVIDER_SITE_OTHER): Payer: Self-pay

## 2010-10-09 ENCOUNTER — Emergency Department (HOSPITAL_COMMUNITY): Admission: EM | Admit: 2010-10-09 | Discharge: 2010-10-09 | Payer: Self-pay | Admitting: Emergency Medicine

## 2010-10-23 ENCOUNTER — Emergency Department (HOSPITAL_COMMUNITY): Admission: EM | Admit: 2010-10-23 | Discharge: 2010-10-24 | Payer: Self-pay | Admitting: Emergency Medicine

## 2010-10-23 ENCOUNTER — Emergency Department (HOSPITAL_COMMUNITY): Admission: EM | Admit: 2010-10-23 | Discharge: 2010-10-23 | Payer: Self-pay | Admitting: Emergency Medicine

## 2010-10-29 ENCOUNTER — Emergency Department (HOSPITAL_COMMUNITY): Admission: EM | Admit: 2010-10-29 | Discharge: 2010-10-29 | Payer: Self-pay | Admitting: Emergency Medicine

## 2010-11-30 ENCOUNTER — Emergency Department (HOSPITAL_COMMUNITY)
Admission: EM | Admit: 2010-11-30 | Discharge: 2010-12-01 | Payer: Self-pay | Source: Home / Self Care | Admitting: Emergency Medicine

## 2010-12-17 ENCOUNTER — Emergency Department (HOSPITAL_COMMUNITY)
Admission: EM | Admit: 2010-12-17 | Discharge: 2010-12-18 | Payer: Self-pay | Source: Home / Self Care | Admitting: Emergency Medicine

## 2010-12-19 ENCOUNTER — Emergency Department (HOSPITAL_COMMUNITY)
Admission: EM | Admit: 2010-12-19 | Discharge: 2010-12-19 | Payer: Self-pay | Source: Home / Self Care | Admitting: Emergency Medicine

## 2010-12-21 LAB — URINALYSIS, ROUTINE W REFLEX MICROSCOPIC
Bilirubin Urine: NEGATIVE
Bilirubin Urine: NEGATIVE
Hgb urine dipstick: NEGATIVE
Hgb urine dipstick: NEGATIVE
Ketones, ur: NEGATIVE mg/dL
Nitrite: NEGATIVE
Nitrite: NEGATIVE
Protein, ur: NEGATIVE mg/dL
Protein, ur: NEGATIVE mg/dL
Specific Gravity, Urine: 1.01 (ref 1.005–1.030)
Specific Gravity, Urine: 1.01 (ref 1.005–1.030)
Urine Glucose, Fasting: NEGATIVE mg/dL
Urine Glucose, Fasting: NEGATIVE mg/dL
Urobilinogen, UA: 1 mg/dL (ref 0.0–1.0)
Urobilinogen, UA: 0.2 mg/dL (ref 0.0–1.0)
pH: 6 (ref 5.0–8.0)
pH: 6.5 (ref 5.0–8.0)

## 2010-12-21 LAB — POCT CARDIAC MARKERS
CKMB, poc: <1 ng/mL — ABNORMAL LOW (ref 1.0–8.0)
Myoglobin, poc: 36.6 ng/mL (ref 12–200)
Troponin i, poc: <0.05 ng/mL (ref 0.00–0.09)

## 2010-12-21 LAB — URINE MICROSCOPIC-ADD ON

## 2010-12-21 LAB — PREGNANCY, URINE: Preg Test, Ur: NEGATIVE

## 2010-12-27 ENCOUNTER — Encounter: Payer: Self-pay | Admitting: Obstetrics & Gynecology

## 2011-01-05 NOTE — Progress Notes (Signed)
Summary: NAUSEA, ?NASH   Admitted with nausea and mental status changes, and elevated liver enzymes likely 2o to fatty liver disease, AST 143-267, ALT 263-213, NL ALK PHOS AND TBILI. HCV, HBV, cerulplasmin NEG. HB 9.9, NL MCV. HEME NEG.  Her appt IS 9/28 @ 300pm w/SF. West Bali MD  July 08, 2010 10:46 AM

## 2011-01-05 NOTE — Miscellaneous (Signed)
Summary: Orders Update  Clinical Lists Changes  Problems: Added new problem of ANEMIA (ICD-285.9) Orders: Added new Test order of T-Iron [phone]) - Signed Added new Test order of T-Iron Binding Capacity (TIBC) ([id number]) - Signed Added new Test order of T-Ferritin [phone]) - Signed

## 2011-01-05 NOTE — Letter (Signed)
Summary: NUTRITIONAL REFERRAL  NUTRITIONAL REFERRAL   Imported By: Ave Filter 09/17/2010 17:06:15  _____________________________________________________________________  External Attachment:    Type:   Image     Comment:   External Document  Appended Document: change meds? Please call pt. There is no alternative to the medication. She will need to follow up with Nutrition.  Appended Document: change meds? pt aware

## 2011-01-05 NOTE — Letter (Signed)
Summary: RADIOLOGY RESULTS MR ABDOMEN  RADIOLOGY RESULTS   Imported By: Rexene Alberts 07/20/2010 13:55:55  _____________________________________________________________________  External Attachment:    Type:   Image     Comment:   External Document

## 2011-01-05 NOTE — Letter (Signed)
Summary: PATHOLOGY REPORT  PATHOLOGY REPORT   Imported By: Rexene Alberts 07/09/2010 08:30:51  _____________________________________________________________________  External Attachment:    Type:   Image     Comment:   External Document

## 2011-01-05 NOTE — Miscellaneous (Signed)
Summary: hemochromatosis genetics  Clinical Lists Changes     L-Hemochromatosis DNA-PCR(C282Y,H63D) - STATUS: Final                                            Perform Date: 27Jul11 11:58  Ordered By: Marko Stai Date: 27Jul11 10:35                                       Last Updated Date: 2 Aug11 08:28  Facility: [facility]                               Department: [facility]  Accession #: [id number]                  USN:       [id number]  Findings  Result Name                              Result     Abnl   Normal Range     Units      Perf. Loc.  DNA Mutation Analysis                    REPORT    Oversized comment, see footnote  1  Footnotes  1. 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  This individual is negative     for the H63D mutation.  Individuals with this genotype     may have elevated serum transferrin iron saturation     levels.  This result reduces the likelihood that this     individual is affected by hereditary hemochromatosis     (HH).  However, it does not rule out the presence of     other mutations within the HFE gene or a diagnosis of     HH.  The risk of this individual carrying a HFE     mutation other than those tested in this assay depends     greatly on family and clinical history as well as     ethnicity.  This assay does not test for other primary     or secondary iron overload disorders.  Consider genetic     counseling and DNA testing for at-risk family members.     V.M. Shawnie Pons, Ph.D., [facility]     Director, Molecular Genetics     Hereditary hemochromatosis [facility]) is an autosomal     recessive disorder of iron metabolism that results in     iron overload and potential organ failure.  It is one     of the most common genetic disorders in individuals  of     European-Caucasian ancestry, with an estimated carrier     frequency of 10%.  HH is caused by mutations in the HFE     gene.  Most individuals with HH (60-90%) are homozygous  for the C282Y mutation.  A smaller percentage of     affected individuals are either compound heterozygous     for the C282Y and H63D mutations (3%-8%), or homozygous     for the H63D mutation (approximately 1%).     This assay detects the two mutations in the HFE gene,     C282Y ([id number]: c.845G>A) and H63D (NM_000410.2: c.     187C>G), that are commonly associated with HH.  The     mutations are detected by multiplex-polymerase chain     reaction (PCR) amplification, followed by digestion of     the amplification products with the restriction enzymes     Rsal and NlaIII, for the detection of the C282Y and     H63D mutations respectively.  Fluorescent-labeled     restriction fragments are detected by capillary     electrophoresis.     This assay does not detect other mutations in the HFE     gene that can cause HH.  Since genetic variation and     other factors can affect the accuracy of direct     mutation testing, these results should be interpreted     in light of clinical and familial data.     For assistance with interpretation of these results,     please contact your local [facility] genetic     counselor or call [phone]).     This test was developed and its performance     characteristics have been determined by [employer], [place], [place].     Performance characteristics refer tothe     analytical performance of the test.     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 Keep OV with [facility].  Appended Document: hemochromatosis genetics Pt's childern should be tested for the  gene.  Appended Document: hemochromatosis genetics Please let pt know she has one copy of mutation that could cause hemochromatosis. This result reduces the likelihood that she is affected by hereditary hemochromatosis. Some people can have other mutations (non HFE gene), so she should:  1. Have ferritin checked in one year. 2. Have her children checked for Hemochromatosis DNA PCR for C282Y, H63D. Can be done by pediatrician. 3. Keep OV as planned.  Appended Document: hemochromatosis genetics Also, iron and tibc in one year.  Appended Document: hemochromatosis genetics pt aware, lab order on file

## 2011-01-05 NOTE — Miscellaneous (Signed)
Summary: CONSULTATION  Clinical Lists Changes  Penny [name], SHADOW[place]               ACCOUNT NO.:  [id number]      MEDICAL RECORD NO.:  [id number]          PATIENT TYPE:  OBV      LOCATION:  A308                          FACILITY:  [facility]      PHYSICIAN:  Jonette Eva, M.D.     DATE OF BIRTH:  08-16-1986      DATE OF CONSULTATION:  07/01/2010   DATE OF DISCHARGE:                                    CONSULTATION      REQUESTING PHYSICIAN:  [name]      PRIMARY CARE PHYSICIAN:  [name], MD      HISTORY OF PRESENT ILLNESS:  The patient is a very pleasant 25 year old   Caucasian female who presented to the emergency department last night   via  EMS for complaints of apparent unresponsiveness at home.  The   patient's boyfriend states that she had finishing eating dinner and   started feeling nauseated and a few moments later, while she was sitting   down, she became unresponsive.  He states she was out a total of an hour   between time at home and in the emergency department.  The ED attending   felt that she was not unresponsive but was most likely a conversion   disorder.  We were consulted regarding her abnormal LFTs.  The patient   tells me this morning that she has not been feeling her normal perky   self for the last couple of weeks.  Last few days, she has had a loss of   appetite with some nausea and one episode of vomiting.  She has chronic   pelvic pain which is essentially unchanged.  She has been taking bladder   installed medications at night time for about a month.  She tells me she   had been on Percocet but due to the fact that she has a 23-month-old at   home, she did not want to be on the medication.  She tells me she   stopped it abruptly about 2 weeks ago.  She is been using tramadol.  She   has also on been on Neurontin.  She takes ibuprofen 800 mg daily as   needed but not every day.  She denies any Tylenol use.  No alcohol use   on a regular basis  and none in over 2 weeks.      She has had some heartburn issues for about 3 weeks.  Denies any   dysphagia.  Denies any upper abdominal pain.  Bowel movements are   regular.  No blood in stool or melena.      Upon evaluation she had a head CT which was normal.  Chest x-ray was   normal.  Abdominal ultrasound is pending for this morning.  Salicylate   level was less than 4, alcohol level less than 5.  Acetaminophen level   less than 10, ammonia level normal at 28.  Urine drug screen was   positive for benzodiazepines and for opiates.  Her  transaminases were up   at 267 and 263.  Her hemoglobin is 10.6 and she has chronic anemia, has   received 12 iron infusions since her gastric bypass 4 years ago.  She   never seen a hematologist for consultation but has an appointment   scheduled with [name] in August.  The patient states she never   had an episode of unresponsiveness before.      MEDICATIONS AT HOME:   1. Elmiron 200 mg twice a day.   2. Pyridium, catheter-instilled sodium bicarb and lidocaine nightly.   3. Nitrofurantoin 50 mg daily.   4. Hydroxyzine 25 mg q.h.s.   5. Neurontin 600 mg t.i.d.   6. Percocet 5/325 mg as needed but she reports stopping this about 2       weeks ago.   7. Tramadol p.r.n.   8. Ibuprofen 800 mg daily p.r.n. but not daily.      ALLERGIES:  CODEINE causes Nausea, Latex causes rash.      PAST MEDICAL HISTORY:   1. Depression.   2. Anemia requiring 12 iron infusions in the last few years.  Schedule       see [name] in August for consultation.   3. History of interstitial cystitis.   4. Migraine headaches.   5. Vulvodynia.   6. Pelvic floor dysfunction.  She has IUD in place.   7. History of fatty liver by imaging studies.   8. She has had intermittent elevated alk phos as well as transaminases       but mild.  Her last LFTs in the records year back in November 2010       were normal.      PAST SURGICAL HISTORY:  Cholecystectomy 2005,  gastric bypass in 2007   (she has lost 120 pounds), D and C for missed abortion in 2008   cystoscopy in 2009 for interstitial cystitis.      FAMILY HISTORY:  Significant for hemochromatosis in mother, maternal   grandmother, paternal great uncle and her brother is suspected to have   hemochromatosis and is currently being tested.  Her father's also had   gastric bypass, CABG and diabetes.  Grandmother also has history of   breast cancer.      SOCIAL HISTORY:  She has a boyfriend.  She has a 60-month-old child at   home.  She is a stay-at-home mom.  Occasionally smokes tobacco   cigarettes not on daily basis.  No alcohol use.  No drug use.      REVIEW OF SYSTEMS:  See HPI for GI.  CONSTITUTIONAL:  See HPI.   CARDIOPULMONARY:  Denies chest pain, shortness of breath, palpitations   or cough.  GENITOURINARY:  See HPI.  She describes daily pelvic burning   pain.      PHYSICAL EXAMINATION:  Temperature 97.5, pulse 73, respirations 18,   blood pressure 114/64, weight 103.8 kg, height 66 inches.   GENERAL:  Pleasant obese Caucasian female in no acute distress.   SKIN:  Warm and dry.  No jaundice.   HEENT:  Sclerae nonicteric.  Oropharyngeal mucosa moist and pink.   CHEST:  Lungs clear to auscultation.   CARDIAC:  Exam reveals regular rate and rhythm.  No murmurs.   ABDOMEN:  Obese and soft.  Mild to moderate pelvic tenderness to deep   palpation.  No rebound or guarding.  No organomegaly or masses.  No   abdominal bruits or hernias.   LOWER EXTREMITIES:  No edema.      LABORATORY DATA:  Sodium 140, potassium 4.2, BUN 11, creatinine 0.75,   glucose 93, total bilirubin 0.7, alk phos 111, AST 267, ALT 263, albumin   4.1  Other labs as outlined above. Platelets 261,000.  White count   66,000.  In August 2010, her ferritin was 37, saturations 23%, iron 80.      IMPRESSION:  The patient is 25 year old lady with chronic interstitial   cystitis on multiple medications including catheter  instilled   medications nightly who presents with questionable unresponsiveness   versus conversion disorder per the ED attending.  We have been consulted   regarding her transaminitis.  The patient also has a history of iron-   deficiency anemia requiring iron IV infusions since her gastric bypass.   Her family history is very significant for hemochromatosis.  She may   have hemochromatosis but based on the need for iron   infusions, has had no evidence of iron overload at this point.  Her LFTs   may be elevated secondary to fatty liver, drug effect from Elmiron or   nitrofurantoin, or other.  We will also check her viral markers as well.      RECOMMENDATIONS:   1. Labs including hepatitis B and C viral markers, genetic testing for       hemochromatosis, follow up LFTs in the morning as well as her CBC.       Check ceruloplasmin.   2. Follow-up abdominal ultrasound results.   3. Suggest she follow up with [name] for consultation for IDA       as scheduled in August.   4. Hemoccult stool x3.   5. Question nausea related to Percocet withdrawal.  We will continue       to monitor for now.               Tana Coast, P.A.         ______________________________   Jonette Eva, M.D.         LL/MEDQ  D:  07/01/2010  T:  07/01/2010  Job:  [id number]      cc:   Lorin Picket A. Gerda Diss, MD   Fax: [phone]      Melissa L. Ladona Ridgel, MD      Electronically Signed by Tana Coast P.A. on 07/13/2010 08:38:56 AM   Electronically Signed by Jonette Eva M.D. on 08/03/2010 11:45:37 AM

## 2011-01-05 NOTE — Assessment & Plan Note (Signed)
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  Chief Complaint:  F/U elevated liver enzymes.  History of Present Illness: Passing out from Neurontin. No liver enzyme check in the past 2 mos. Son being checked for hemochromatosis. Takes TMP daily for cath treatments. Seems to be more constipated. Was having diarrhea and now BM ever 0-1 daily. Taking OTC stool. Water: not a whole lot, watered tea with sugar.  Can't use Splenda because it gives her migraines. Have been fighting a lot of depression and put on weigth since GAB.: Max weight: 300 lbs, Lost 125 lbs and now 236 lbs.   Current Medications (verified): 1)  Elmiron 100 Mg Caps (Pentosan Polysulfate Sodium) .... Take 1 Tablet By Mouth Two Times A Day 2)  Trimethoprim .... As Directed 3)  Savella .... As Directed 4)  Phenergan .... As Needed 5)  Hydrocodone .... As Needed 6)  Pyridium .... As Needed 7)  Zofran .... As Needed 8)  Lidocaine 2%, Sodium Bicarb, Heparin .... Cath Treatments Nightly  Allergies (verified): 1)  ! Gabapentin 2)  ! Codeine 3)  ! * Latex  Past History:  Past Medical History: Migraines Interstitial Cystitis IUD FEB 2010 EGD: 2011  Past Surgical History: GAB in [facility] 2007 Cholecystectomy: biliary dyskinesia, 2005-LS  Family History: HEMOCHROMATOSIS  Social History: Single mom. Occasional tob. No EtOH.  Review of Systems       LMP: IUD  Vital Signs:  Patient profile:   25 year old female Height:      66 inches Weight:      236 pounds BMI:     38.23 Temp:     97.9 degrees F oral Pulse rate:   80 / minute BP sitting:   120 / 80  (left arm) Cuff size:   large  Vitals Entered By: Cloria Spring LPN (September 17, 2010 3:12 PM)  Physical Exam  General:  Well developed, well nourished, no acute distress. Head:  Normocephalic and atraumatic. Eyes:  PERRL, no icterus. Mouth:  No deformity or lesions. Neck:   Supple; no masses. Lungs:  Clear throughout to auscultation. Heart:  Regular rate and rhythm; no murmurs. Abdomen:  Soft, nontender and nondistended. Normal bowel sounds. obese.  obese.   Extremities:  No edema or deformities noted. Neurologic:  Alert and  oriented x4;  grossly normal neurologically.  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 Pt carries gene for hemochromatosis.  Sons's test pending. Encouraged pt to lose weight. Nurtition consult. Rx for diethyproprion give, for OCT, NOV, DEC. HO on side effects given. HFP today. Follow up in 6 mos.   Orders: T-Hepatic Function [id number]) Prescriptions: DIETHYLPROPION HCL 25 MG TABS (DIETHYLPROPION HCL) 1 by mouth three times a day with meals  #90 x 0   Entered and Authorized by:   [place] MD   Signed by:   [place] Bali MD on 09/17/2010   Method used:   Print then Give to Patient   RxID:   [id number] DIETHYLPROPION HCL 25 MG TABS (DIETHYLPROPION HCL) 1 by mouth three times a day with meals  #90 x 0   Entered and Authorized by:   [place] Bali MD   Signed by:   [place] B[place] MD on 09/17/2010   Method used:   Print then Give to Patient   RxID:   [id number] DIETHYLPROPION HCL 25  MG TABS (DIETHYLPROPION HCL) 1 by mouth three times a day with meals  #90 x 0   Entered and Authorized by:   [place] Bali MD   Signed by:   [place] Bali MD on 09/17/2010   Method used:   Print then Give to Patient   RxID:   [id number]   Appended Document: NASH, 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 Patient Level IV ([id number]) - Signed

## 2011-01-07 NOTE — Progress Notes (Signed)
Summary: change meds?  Phone Note Call from Patient Call back at [facility] Phone [phone]   Caller: Patient Summary of Call: pt called- medicaid wont pay for the medication you sent over yesterday. Pt wants to know if there is an alternative that she can take? pt uses [place]. please advise. pt is going out of town tonight and would like to start meds today.  Initial call taken by: Hendricks Limes LPN,  September 18, 2010 12:22 PM     Appended Document: change meds? Please call pt. There is no alternative to the medication. She will need to follow up with Nutrition.  Appended Document: change meds? pt aware  Appended Document: change meds? Oct 2011 231 lbs saw nutrition.

## 2011-01-22 ENCOUNTER — Emergency Department (HOSPITAL_COMMUNITY)
Admission: EM | Admit: 2011-01-22 | Discharge: 2011-01-23 | Disposition: A | Discharge status: Home / Self Care | Payer: Medicaid Other | Attending: Emergency Medicine | Admitting: Emergency Medicine

## 2011-01-22 DIAGNOSIS — H9209 Otalgia, unspecified ear: Secondary | ICD-10-CM | POA: Insufficient documentation

## 2011-01-22 DIAGNOSIS — M542 Cervicalgia: Secondary | ICD-10-CM | POA: Insufficient documentation

## 2011-01-22 DIAGNOSIS — H53149 Visual discomfort, unspecified: Secondary | ICD-10-CM | POA: Insufficient documentation

## 2011-01-22 DIAGNOSIS — R51 Headache: Secondary | ICD-10-CM | POA: Insufficient documentation

## 2011-02-06 IMAGING — CR DG CHEST 1V PORT
1 series · 1 of 1 positions shown · non-contrast
Comparison: 06/30/2010

CLINICAL DATA: Back pain

PORTABLE CHEST - 1 VIEW

[view not recorded]
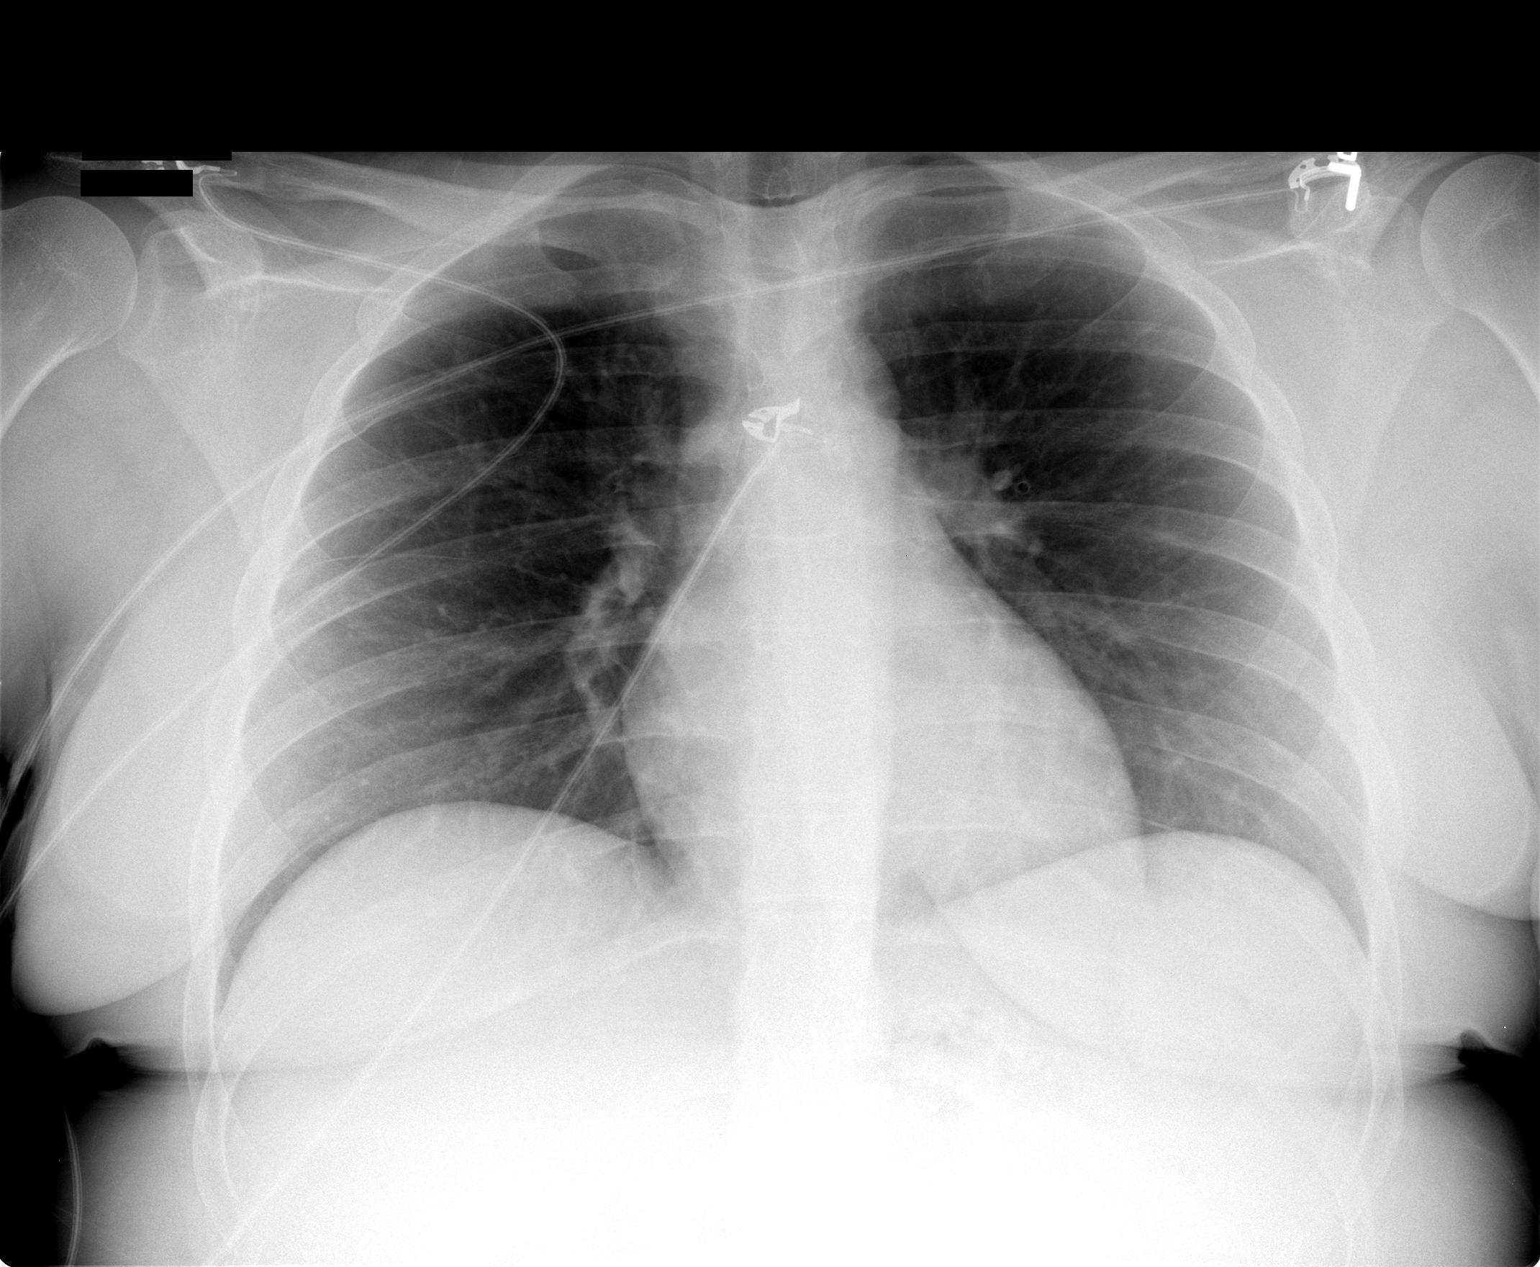

[1 of 1 positions shown; findings below may reference images not displayed]

FINDINGS: The heart size and mediastinal contours are within normal
limits.  Both lungs are clear.  The visualized skeletal structures
are unremarkable.
IMPRESSION: No active disease.

## 2011-02-15 LAB — URINALYSIS, ROUTINE W REFLEX MICROSCOPIC
Bilirubin Urine: NEGATIVE
Leukocytes, UA: NEGATIVE
Nitrite: NEGATIVE
Specific Gravity, Urine: 1.015 (ref 1.005–1.030)
Urobilinogen, UA: 0.2 mg/dL (ref 0.0–1.0)

## 2011-02-15 LAB — URINE MICROSCOPIC-ADD ON

## 2011-02-15 LAB — PREGNANCY, URINE: Preg Test, Ur: NEGATIVE

## 2011-02-16 LAB — URINALYSIS, ROUTINE W REFLEX MICROSCOPIC
Glucose, UA: 100 mg/dL — AB
Hgb urine dipstick: NEGATIVE
Protein, ur: NEGATIVE mg/dL

## 2011-02-16 LAB — URINE MICROSCOPIC-ADD ON

## 2011-02-17 LAB — URINE MICROSCOPIC-ADD ON

## 2011-02-17 LAB — URINE CULTURE

## 2011-02-17 LAB — URINALYSIS, ROUTINE W REFLEX MICROSCOPIC
Bilirubin Urine: NEGATIVE
Glucose, UA: NEGATIVE mg/dL
Specific Gravity, Urine: <1.005 — ABNORMAL LOW (ref 1.005–1.030)
Urobilinogen, UA: 0.2 mg/dL (ref 0.0–1.0)

## 2011-02-18 LAB — CBC
HCT: 29.7 % — ABNORMAL LOW (ref 36.0–46.0)
MCHC: 33.7 g/dL (ref 30.0–36.0)
Platelets: 276 10*3/uL (ref 150–400)
RDW: 15.9 % — ABNORMAL HIGH (ref 11.5–15.5)
WBC: 6.7 10*3/uL (ref 4.0–10.5)

## 2011-02-18 LAB — URINALYSIS, ROUTINE W REFLEX MICROSCOPIC
Glucose, UA: NEGATIVE mg/dL
Ketones, ur: NEGATIVE mg/dL
Leukocytes, UA: NEGATIVE
Nitrite: NEGATIVE
Protein, ur: NEGATIVE mg/dL
Urobilinogen, UA: 0.2 mg/dL (ref 0.0–1.0)

## 2011-02-18 LAB — DIFFERENTIAL
Lymphs Abs: 2.6 10*3/uL (ref 0.7–4.0)
Monocytes Absolute: 0.8 10*3/uL (ref 0.1–1.0)
Monocytes Relative: 12 % (ref 3–12)
Neutro Abs: 3.2 10*3/uL (ref 1.7–7.7)
Neutrophils Relative %: 48 % (ref 43–77)

## 2011-02-18 LAB — GLUCOSE, CAPILLARY: Glucose-Capillary: 87 mg/dL (ref 70–99)

## 2011-02-18 LAB — COMPREHENSIVE METABOLIC PANEL
ALT: 321 U/L — ABNORMAL HIGH (ref 0–35)
Albumin: 3.8 g/dL (ref 3.5–5.2)
BUN: 12 mg/dL (ref 6–23)
Calcium: 8.6 mg/dL (ref 8.4–10.5)
Glucose, Bld: 88 mg/dL (ref 70–99)
Sodium: 136 mEq/L (ref 135–145)
Total Protein: 6.5 g/dL (ref 6.0–8.3)

## 2011-02-18 LAB — PREGNANCY, URINE: Preg Test, Ur: NEGATIVE

## 2011-02-18 LAB — URINE MICROSCOPIC-ADD ON

## 2011-02-18 LAB — RAPID URINE DRUG SCREEN, HOSP PERFORMED
Amphetamines: NOT DETECTED
Benzodiazepines: POSITIVE — AB
Cocaine: NOT DETECTED

## 2011-02-18 LAB — ETHANOL: Alcohol, Ethyl (B): <5 mg/dL (ref 0–10)

## 2011-02-19 LAB — CBC
HCT: 32.3 % — ABNORMAL LOW (ref 36.0–46.0)
Hemoglobin: 10.1 g/dL — ABNORMAL LOW (ref 12.0–15.0)
MCH: 28.1 pg (ref 26.0–34.0)
MCV: 90 fL (ref 78.0–100.0)
Platelets: 261 10*3/uL (ref 150–400)
RBC: 3.59 MIL/uL — ABNORMAL LOW (ref 3.87–5.11)
WBC: 7.5 10*3/uL (ref 4.0–10.5)

## 2011-02-19 LAB — DIFFERENTIAL
Eosinophils Absolute: 0.1 10*3/uL (ref 0.0–0.7)
Eosinophils Relative: 2 % (ref 0–5)
Lymphocytes Relative: 35 % (ref 12–46)
Lymphs Abs: 2.6 10*3/uL (ref 0.7–4.0)
Monocytes Absolute: 0.7 10*3/uL (ref 0.1–1.0)
Monocytes Relative: 10 % (ref 3–12)

## 2011-02-19 LAB — BASIC METABOLIC PANEL
CO2: 28 mEq/L (ref 19–32)
Chloride: 102 mEq/L (ref 96–112)
Creatinine, Ser: 0.67 mg/dL (ref 0.4–1.2)
GFR calc Af Amer: >60 mL/min (ref >60)
Potassium: 4 mEq/L (ref 3.5–5.1)

## 2011-02-19 LAB — GLUCOSE, CAPILLARY: Glucose-Capillary: 85 mg/dL (ref 70–99)

## 2011-02-20 LAB — COMPREHENSIVE METABOLIC PANEL
ALT: 213 U/L — ABNORMAL HIGH (ref 0–35)
AST: 267 U/L — ABNORMAL HIGH (ref 0–37)
BUN: 7 mg/dL (ref 6–23)
CO2: 26 mEq/L (ref 19–32)
CO2: 28 mEq/L (ref 19–32)
Calcium: 8.6 mg/dL (ref 8.4–10.5)
Calcium: 9 mg/dL (ref 8.4–10.5)
Chloride: 106 mEq/L (ref 96–112)
Creatinine, Ser: 0.75 mg/dL (ref 0.4–1.2)
GFR calc non Af Amer: >60 mL/min (ref >60)
GFR calc non Af Amer: >60 mL/min (ref >60)
Glucose, Bld: 89 mg/dL (ref 70–99)
Glucose, Bld: 93 mg/dL (ref 70–99)
Sodium: 139 mEq/L (ref 135–145)
Total Bilirubin: 0.7 mg/dL (ref 0.3–1.2)

## 2011-02-20 LAB — URINALYSIS, ROUTINE W REFLEX MICROSCOPIC
Bilirubin Urine: NEGATIVE
Ketones, ur: NEGATIVE mg/dL
Leukocytes, UA: NEGATIVE
Nitrite: NEGATIVE
Specific Gravity, Urine: 1.01 (ref 1.005–1.030)
Urobilinogen, UA: 0.2 mg/dL (ref 0.0–1.0)
pH: 6.5 (ref 5.0–8.0)

## 2011-02-20 LAB — HEPATITIS B SURFACE ANTIGEN: Hepatitis B Surface Ag: NEGATIVE

## 2011-02-20 LAB — CBC
HCT: 29.3 % — ABNORMAL LOW (ref 36.0–46.0)
HCT: 31.8 % — ABNORMAL LOW (ref 36.0–46.0)
Hemoglobin: 10.6 g/dL — ABNORMAL LOW (ref 12.0–15.0)
Hemoglobin: 9.9 g/dL — ABNORMAL LOW (ref 12.0–15.0)
MCH: 29.4 pg (ref 26.0–34.0)
MCH: 29.5 pg (ref 26.0–34.0)
MCHC: 33.3 g/dL (ref 30.0–36.0)
MCHC: 33.7 g/dL (ref 30.0–36.0)
MCV: 87.6 fL (ref 78.0–100.0)
MCV: 88.2 fL (ref 78.0–100.0)
RBC: 3.6 MIL/uL — ABNORMAL LOW (ref 3.87–5.11)

## 2011-02-20 LAB — URINE CULTURE
Colony Count: NO GROWTH
Culture: NO GROWTH

## 2011-02-20 LAB — DIFFERENTIAL
Basophils Absolute: 0 10*3/uL (ref 0.0–0.1)
Basophils Relative: 1 % (ref 0–1)
Eosinophils Absolute: 0.2 10*3/uL (ref 0.0–0.7)
Eosinophils Absolute: 0.3 10*3/uL (ref 0.0–0.7)
Eosinophils Relative: 4 % (ref 0–5)
Lymphocytes Relative: 36 % (ref 12–46)
Lymphs Abs: 2.4 10*3/uL (ref 0.7–4.0)
Lymphs Abs: 3 10*3/uL (ref 0.7–4.0)
Neutro Abs: 1.9 10*3/uL (ref 1.7–7.7)
Neutrophils Relative %: 34 % — ABNORMAL LOW (ref 43–77)
Neutrophils Relative %: 49 % (ref 43–77)

## 2011-02-20 LAB — HEMOCCULT GUIAC POC 1CARD (OFFICE): Fecal Occult Bld: NEGATIVE

## 2011-02-20 LAB — SALICYLATE LEVEL: Salicylate Lvl: <4 mg/dL (ref 2.8–20.0)

## 2011-02-20 LAB — HEMOCHROMATOSIS DNA-PCR(C282Y,H63D)

## 2011-02-20 LAB — GLUCOSE, CAPILLARY

## 2011-02-20 LAB — HEPATITIS C ANTIBODY: HCV Ab: NEGATIVE

## 2011-02-20 LAB — CERULOPLASMIN: Ceruloplasmin: 28 mg/dL (ref 21–63)

## 2011-02-20 LAB — RAPID URINE DRUG SCREEN, HOSP PERFORMED: Barbiturates: NOT DETECTED

## 2011-02-20 LAB — POCT PREGNANCY, URINE: Preg Test, Ur: NEGATIVE

## 2011-02-20 LAB — AMMONIA: Ammonia: 28 umol/L (ref 11–35)

## 2011-02-20 LAB — FERRITIN: Ferritin: 10 ng/mL (ref 10–291)

## 2011-02-20 LAB — IRON: Iron: 38 ug/dL — ABNORMAL LOW (ref 42–135)

## 2011-02-21 LAB — URINE CULTURE: Colony Count: >=100000

## 2011-02-21 LAB — URINALYSIS, ROUTINE W REFLEX MICROSCOPIC
Glucose, UA: NEGATIVE mg/dL
Ketones, ur: NEGATIVE mg/dL
Nitrite: POSITIVE — AB
Nitrite: NEGATIVE
Specific Gravity, Urine: 1.02 (ref 1.005–1.030)
Urobilinogen, UA: 4 mg/dL — ABNORMAL HIGH (ref 0.0–1.0)
pH: 5.5 (ref 5.0–8.0)
pH: 5.5 (ref 5.0–8.0)

## 2011-02-21 LAB — BASIC METABOLIC PANEL
BUN: 8 mg/dL (ref 6–23)
Chloride: 111 mEq/L (ref 96–112)
GFR calc Af Amer: >60 mL/min (ref >60)
GFR calc non Af Amer: >60 mL/min (ref >60)
Potassium: 3.4 mEq/L — ABNORMAL LOW (ref 3.5–5.1)
Sodium: 139 mEq/L (ref 135–145)

## 2011-02-21 LAB — DIFFERENTIAL
Basophils Absolute: 0 10*3/uL (ref 0.0–0.1)
Lymphocytes Relative: 46 % (ref 12–46)
Lymphs Abs: 3 10*3/uL (ref 0.7–4.0)
Monocytes Absolute: 0.5 10*3/uL (ref 0.1–1.0)
Neutro Abs: 2.8 10*3/uL (ref 1.7–7.7)

## 2011-02-21 LAB — CBC
HCT: 29.1 % — ABNORMAL LOW (ref 36.0–46.0)
MCV: 87.5 fL (ref 78.0–100.0)
RBC: 3.33 MIL/uL — ABNORMAL LOW (ref 3.87–5.11)
RDW: 13 % (ref 11.5–15.5)
WBC: 6.5 10*3/uL (ref 4.0–10.5)

## 2011-02-21 LAB — PREGNANCY, URINE: Preg Test, Ur: NEGATIVE

## 2011-02-21 LAB — WET PREP, GENITAL
WBC, Wet Prep HPF POC: NONE SEEN
Yeast Wet Prep HPF POC: NONE SEEN

## 2011-02-21 LAB — URINE MICROSCOPIC-ADD ON

## 2011-02-22 LAB — URINALYSIS, ROUTINE W REFLEX MICROSCOPIC
Glucose, UA: 100 mg/dL — AB
Nitrite: NEGATIVE
Specific Gravity, Urine: <1.005 — ABNORMAL LOW (ref 1.005–1.030)
pH: 6 (ref 5.0–8.0)

## 2011-02-24 LAB — CSF CELL COUNT WITH DIFFERENTIAL
RBC Count, CSF: 14 /mm3 — ABNORMAL HIGH
RBC Count, CSF: 5 /mm3 — ABNORMAL HIGH
Tube #: 1
Tube #: 3
WBC, CSF: 3 /mm3 (ref 0–5)

## 2011-02-24 LAB — URINALYSIS, ROUTINE W REFLEX MICROSCOPIC
Ketones, ur: NEGATIVE mg/dL
Nitrite: NEGATIVE
Urobilinogen, UA: 0.2 mg/dL (ref 0.0–1.0)

## 2011-02-24 LAB — CBC
HCT: 31.7 % — ABNORMAL LOW (ref 36.0–46.0)
MCHC: 34.7 g/dL (ref 30.0–36.0)
MCV: 88.3 fL (ref 78.0–100.0)
Platelets: 214 10*3/uL (ref 150–400)
RDW: 13.3 % (ref 11.5–15.5)
WBC: 6.1 10*3/uL (ref 4.0–10.5)

## 2011-02-24 LAB — BASIC METABOLIC PANEL
BUN: 13 mg/dL (ref 6–23)
CO2: 28 mEq/L (ref 19–32)
Chloride: 106 mEq/L (ref 96–112)
Glucose, Bld: 97 mg/dL (ref 70–99)
Potassium: 3.9 mEq/L (ref 3.5–5.1)

## 2011-02-24 LAB — DIFFERENTIAL
Basophils Relative: 0 % (ref 0–1)
Eosinophils Absolute: 0.1 10*3/uL (ref 0.0–0.7)
Eosinophils Relative: 1 % (ref 0–5)
Lymphs Abs: 1.9 10*3/uL (ref 0.7–4.0)

## 2011-02-24 LAB — PROTEIN AND GLUCOSE, CSF
Glucose, CSF: 61 mg/dL (ref 43–76)
Total  Protein, CSF: 27 mg/dL (ref 15–45)

## 2011-02-24 LAB — AFB CULTURE WITH SMEAR (NOT AT ARMC)

## 2011-02-24 LAB — CSF CULTURE W GRAM STAIN

## 2011-02-24 LAB — PREGNANCY, URINE: Preg Test, Ur: NEGATIVE

## 2011-03-07 ENCOUNTER — Emergency Department (HOSPITAL_COMMUNITY)
Admission: EM | Admit: 2011-03-07 | Discharge: 2011-03-07 | Disposition: A | Discharge status: Home / Self Care | Payer: Medicaid Other | Attending: Emergency Medicine | Admitting: Emergency Medicine

## 2011-03-07 DIAGNOSIS — R109 Unspecified abdominal pain: Secondary | ICD-10-CM | POA: Insufficient documentation

## 2011-03-07 DIAGNOSIS — N390 Urinary tract infection, site not specified: Secondary | ICD-10-CM | POA: Insufficient documentation

## 2011-03-07 LAB — URINALYSIS, ROUTINE W REFLEX MICROSCOPIC
Glucose, UA: 250 mg/dL — AB
Nitrite: POSITIVE — AB
Protein, ur: 100 mg/dL — AB
pH: 7.5 (ref 5.0–8.0)

## 2011-03-07 LAB — URINE MICROSCOPIC-ADD ON

## 2011-03-08 LAB — CBC
HCT: 34.3 % — ABNORMAL LOW (ref 36.0–46.0)
MCHC: 33.2 g/dL (ref 30.0–36.0)
MCV: 89.3 fL (ref 78.0–100.0)
RBC: 3.84 MIL/uL — ABNORMAL LOW (ref 3.87–5.11)
WBC: 6.1 10*3/uL (ref 4.0–10.5)

## 2011-03-08 LAB — URINE CULTURE
Colony Count: NO GROWTH
Culture: NO GROWTH

## 2011-03-08 LAB — BASIC METABOLIC PANEL
CO2: 24 mEq/L (ref 19–32)
Chloride: 104 mEq/L (ref 96–112)
Creatinine, Ser: 0.46 mg/dL (ref 0.4–1.2)
GFR calc Af Amer: >60 mL/min (ref >60)
Potassium: 3.5 mEq/L (ref 3.5–5.1)

## 2011-03-08 LAB — URINALYSIS, ROUTINE W REFLEX MICROSCOPIC
Bilirubin Urine: NEGATIVE
Glucose, UA: NEGATIVE mg/dL
Hgb urine dipstick: NEGATIVE
Ketones, ur: 40 mg/dL — AB
Protein, ur: NEGATIVE mg/dL
pH: 6 (ref 5.0–8.0)

## 2011-03-08 LAB — DIFFERENTIAL
Basophils Relative: 0 % (ref 0–1)
Eosinophils Absolute: 0 10*3/uL (ref 0.0–0.7)
Eosinophils Relative: 0 % (ref 0–5)
Lymphs Abs: 0.4 10*3/uL — ABNORMAL LOW (ref 0.7–4.0)
Monocytes Absolute: 0.7 10*3/uL (ref 0.1–1.0)
Monocytes Relative: 12 % (ref 3–12)

## 2011-03-08 LAB — URINE MICROSCOPIC-ADD ON

## 2011-03-10 LAB — URINE MICROSCOPIC-ADD ON

## 2011-03-10 LAB — CBC
HCT: 32.9 % — ABNORMAL LOW (ref 36.0–46.0)
MCHC: 34.5 g/dL (ref 30.0–36.0)
MCV: 93.7 fL (ref 78.0–100.0)
RBC: 3.51 MIL/uL — ABNORMAL LOW (ref 3.87–5.11)
WBC: 7.7 10*3/uL (ref 4.0–10.5)

## 2011-03-10 LAB — COMPREHENSIVE METABOLIC PANEL
BUN: 13 mg/dL (ref 6–23)
CO2: 22 mEq/L (ref 19–32)
Chloride: 105 mEq/L (ref 96–112)
Creatinine, Ser: 0.7 mg/dL (ref 0.4–1.2)
GFR calc non Af Amer: >60 mL/min (ref >60)
Total Bilirubin: 1 mg/dL (ref 0.3–1.2)

## 2011-03-10 LAB — DIFFERENTIAL
Basophils Absolute: 0.1 10*3/uL (ref 0.0–0.1)
Lymphocytes Relative: 40 % (ref 12–46)
Neutro Abs: 3.8 10*3/uL (ref 1.7–7.7)
Neutrophils Relative %: 49 % (ref 43–77)

## 2011-03-10 LAB — URINE CULTURE: Colony Count: >=100000

## 2011-03-10 LAB — URINALYSIS, ROUTINE W REFLEX MICROSCOPIC
Bilirubin Urine: NEGATIVE
Hgb urine dipstick: NEGATIVE
Nitrite: POSITIVE — AB
Protein, ur: >300 mg/dL — AB
Urobilinogen, UA: >8 mg/dL — ABNORMAL HIGH (ref 0.0–1.0)

## 2011-03-10 LAB — PREGNANCY, URINE: Preg Test, Ur: NEGATIVE

## 2011-03-11 LAB — URINE CULTURE: Culture  Setup Time: 201204022200

## 2011-03-13 LAB — HEMOGLOBIN AND HEMATOCRIT, BLOOD: HCT: 30.3 % — ABNORMAL LOW (ref 36.0–46.0)

## 2011-03-13 LAB — IRON AND TIBC
Iron: 80 ug/dL (ref 42–135)
UIBC: 273 ug/dL

## 2011-03-13 LAB — FERRITIN: Ferritin: 37 ng/mL (ref 10–291)

## 2011-03-14 LAB — BASIC METABOLIC PANEL
CO2: 25 mEq/L (ref 19–32)
Calcium: 8 mg/dL — ABNORMAL LOW (ref 8.4–10.5)
Creatinine, Ser: 0.58 mg/dL (ref 0.4–1.2)
GFR calc Af Amer: >60 mL/min (ref >60)

## 2011-03-14 LAB — MAGNESIUM: Magnesium: 2.2 mg/dL (ref 1.5–2.5)

## 2011-03-14 LAB — CBC
MCHC: 32.2 g/dL (ref 30.0–36.0)
RBC: 3.1 MIL/uL — ABNORMAL LOW (ref 3.87–5.11)

## 2011-03-22 LAB — COMPREHENSIVE METABOLIC PANEL
ALT: 11 U/L (ref 0–35)
AST: 15 U/L (ref 0–37)
Albumin: 2.7 g/dL — ABNORMAL LOW (ref 3.5–5.2)
Calcium: 8.7 mg/dL (ref 8.4–10.5)
GFR calc Af Amer: >60 mL/min (ref >60)
Sodium: 135 mEq/L (ref 135–145)
Total Protein: 5.4 g/dL — ABNORMAL LOW (ref 6.0–8.3)

## 2011-03-22 LAB — CBC
HCT: 25.9 % — ABNORMAL LOW (ref 36.0–46.0)
Hemoglobin: 10 g/dL — ABNORMAL LOW (ref 12.0–15.0)
Hemoglobin: 7.4 g/dL — CL (ref 12.0–15.0)
Hemoglobin: 7.7 g/dL — CL (ref 12.0–15.0)
MCHC: 33.8 g/dL (ref 30.0–36.0)
MCHC: 34.1 g/dL (ref 30.0–36.0)
MCV: 90.6 fL (ref 78.0–100.0)
Platelets: 141 10*3/uL — ABNORMAL LOW (ref 150–400)
Platelets: 142 10*3/uL — ABNORMAL LOW (ref 150–400)
Platelets: 181 10*3/uL (ref 150–400)
RBC: 2.38 MIL/uL — ABNORMAL LOW (ref 3.87–5.11)
RBC: 2.44 MIL/uL — ABNORMAL LOW (ref 3.87–5.11)
RDW: 12.9 % (ref 11.5–15.5)
RDW: 13.5 % (ref 11.5–15.5)
RDW: 13.6 % (ref 11.5–15.5)
RDW: 13.8 % (ref 11.5–15.5)
WBC: 7.3 10*3/uL (ref 4.0–10.5)

## 2011-03-22 LAB — WET PREP, GENITAL: Clue Cells Wet Prep HPF POC: NONE SEEN

## 2011-03-22 LAB — STREP B DNA PROBE: Strep Group B Ag: NEGATIVE

## 2011-03-22 LAB — RPR: RPR Ser Ql: NONREACTIVE

## 2011-03-22 LAB — URINALYSIS, ROUTINE W REFLEX MICROSCOPIC
Ketones, ur: 15 mg/dL — AB
Leukocytes, UA: NEGATIVE
Nitrite: NEGATIVE
Protein, ur: NEGATIVE mg/dL
Urobilinogen, UA: 0.2 mg/dL (ref 0.0–1.0)
pH: 7.5 (ref 5.0–8.0)

## 2011-03-22 LAB — GC/CHLAMYDIA PROBE AMP, GENITAL: Chlamydia, DNA Probe: NEGATIVE

## 2011-04-07 ENCOUNTER — Ambulatory Visit: Payer: Medicaid Other | Admitting: Gastroenterology

## 2011-04-20 NOTE — H&P (Signed)
Penny [name], Penny [name]                  ACCOUNT NO.:  [id number]   MEDICAL RECORD NO.:  [id number]          PATIENT TYPE:  AMB   LOCATION:  DAY                           FACILITY:  [facility]   PHYSICIAN:  Tilda Burrow, M.D. DATE OF BIRTH:  [birth year]   DATE OF ADMISSION:  06/16/2007  DATE OF DISCHARGE:  LH                              HISTORY & PHYSICAL   ADMISSION DIAGNOSES:  Pregnancy [redacted] weeks gestation, missed abortion.   HISTORY OF PRESENT ILLNESS:  This 25 year old, primiparous female has  had an ultrasound diagnosis of missed AB with gestational sac confirming  a blighted pregnancy. There is a gestational sac. There is a yoke sac  but there is a fetal pole that has no fetal motion present. A diagnosis  of missed AB is made and acknowledged per patient. Blood pregnancy  levels have leveled off and are starting to drop, only dropped slightly.  Therefore we are proceeding with suction D&C as per patient request. The  patient desires to proceed at this time due to concerns.   PAST MEDICAL HISTORY:  Positive for migraines, urinary burning.   PAST SURGICAL HISTORY:  Tonsillectomy, cholecystectomy and gastric  bypass.   ALLERGIES:  CODEINE.   SOCIAL HISTORY:  Single.   PHYSICAL EXAMINATION:  GENERAL:  Alert, active, large-framed Caucasian  female, alert.  HEENT:  Pupils equal round and reactive.  NECK:  Supple.  CHEST:  Clear to auscultation.  ABDOMEN:  Nontender.  EXTERNAL GENITALIA:  Multiparous cervix closed, uterus anteflexed, 8  week size.   Blood type A+, urine drug screen negative. Hemoglobin 12, hematocrit 37.  Hepatitis, HIV, RPR all negative.   PLAN:  Suction D&C June 16, 2007 [facility].      Tilda Burrow, M.D.  Electronically Signed     JVF/MEDQ  D:  06/16/2007  T:  06/16/2007  Job:  [id number]

## 2011-04-20 NOTE — Op Note (Signed)
Penny [name], Penny [name]                  ACCOUNT NO.:  [id number]   MEDICAL RECORD NO.:  [id number]          PATIENT TYPE:  AMB   LOCATION:  NESC                         FACILITY:  [facility]   PHYSICIAN:  Bertram Millard. Dahlstedt, M.D.DATE OF BIRTH:  Apr 17, 1986   DATE OF PROCEDURE:  12/18/2007  DATE OF DISCHARGE:                               OPERATIVE REPORT   PREOPERATIVE DIAGNOSIS:  Possible interstitial cystitis.   POSTOPERATIVE DIAGNOSIS:  Probable interstitial cystitis.   SURGICAL PROCEDURES:  Cyst cystoscopy, hydro-overdistention of bladder,  instillation of Pyridium/Marcaine.   SURGEON:  Bertram Millard. Dahlstedt, M.D.   ANESTHESIA:  General with LMA.   COMPLICATIONS:  None.   BRIEF HISTORY:  A 25 year old female whom I have seen several times  before for lower urinary tract symptoms - bladder pain, frequency, and  urgency.  She also has significant dyspareunia.  She found that dietary  changes help somewhat, but she still has significant symptoms.  I  recommended that we proceed with cysto and HOD as a therapeutic and  diagnostic maneuver.  Risks and complications of the procedure have been  discussed with the patient, who understands and desires to proceed.   DESCRIPTION OF PROCEDURE:  The patient was identified in the holding  area.  She was taken to the operating room, where general anesthetic was  administered using the LMA.  She was placed in a dorsal lithotomy  position.  Genitalia and perineum were prepped and draped.  A 22-French  panendoscope was advanced into her bladder.  Inspection of the bladder  walls revealed normal-appearing vasculature, normal-appearing  urothelium, normal ureteral orifices.  No tumors, trabeculations, or  foreign bodies.  The bladder was filled to capacity with the fluid hung  800 mm above the patient's bladder level.  The bladder was filled to  capacity and held at capacity for approximately 10 minutes.  The bladder  was then drained, and the  capacity was approximately 650 mL.  At this  point, the bladder was then again inspected.  The major part of the  bladder wall appeared normal, but in the trigonal area there were  glomerulations apparent, with slight bleeding.  No other significant  glomerulations were seen.  At this point, the bladder was drained and 30  mL of Pyridium/Marcaine mixture was placed.  At this point, the patient  was awakened and taken to the PACU in stable condition.   She will follow up in approximately 2 weeks.   DISCHARGE MEDICATIONS:  1. Macrodantin 100 mg 1 p.o. b.i.d. for 2 weeks.  2. Mepergan Fortis 1 p.o. q.4 h p.r.n. pain, #20.      Bertram Millard. Dahlstedt, M.D.  Electronically Signed     SMD/MEDQ  D:  12/18/2007  T:  12/19/2007  Job:  [id number]   cc:   Lorin Picket A. Gerda Diss, MD  Fax: [phone]   Lazaro Arms, M.D.  Fax: [phone]

## 2011-04-20 NOTE — Op Note (Signed)
[name], Penny [name]                  ACCOUNT NO.:  [id number]   MEDICAL RECORD NO.:  [id number]          PATIENT TYPE:  AMB   LOCATION:  DAY                           FACILITY:  [facility]   PHYSICIAN:  Tilda Burrow, M.D. DATE OF BIRTH:  16-Jul-1986   DATE OF PROCEDURE:  06/16/2007  DATE OF DISCHARGE:  06/16/2007                               OPERATIVE REPORT   PREOPERATIVE DIAGNOSIS:  Pregnancy at [redacted] weeks gestation, missed  abortion.   POSTOPERATIVE DIAGNOSIS:  Pregnancy at [redacted] weeks gestation, missed  abortion.   PROCEDURE:  Suction dilatation and curettage.   SURGEON:  Tilda Burrow, M.D.   ASSISTANT:  None.   ANESTHESIA:  General with LMA due to history of gastric bypass.   FINDINGS:  Products of conception consistent with missed AB.  Blood type  Rh positive.   DETAILS OF PROCEDURE:  The patient was taken to the operating room and  prepped and draped for vaginal procedure having received general  anesthesia. The cervix was grasped and sounded to 11 cm and dilated to  29 [place] allowing introduction of an 8 mm curved suction curet with  circumferential suction removing all blood and tissue fragments  consistent with missed AB.  The sharp curettage confirmed that the  uterus was well evacuated.  There was no suspicion of complication.  The  procedure was considered completed.  The patient was allowed to awaken  and go to recovery in good condition where Rh positive blood type was  confirmed to the chart.      Tilda Burrow, M.D.  Electronically Signed     JVF/MEDQ  D:  06/19/2007  T:  06/20/2007  Job:  [id number]

## 2011-04-23 NOTE — Op Note (Signed)
NAME:  Penny [name], Penny [name]                            ACCOUNT NO.:  [id number]   MEDICAL RECORD NO.:  [id number]                   PATIENT TYPE:  AMB   LOCATION:  DAY                                  FACILITY:  [facility]   PHYSICIAN:  Leroy C. Katrinka Blazing, M.D.                DATE OF BIRTH:  10/26/1986   DATE OF PROCEDURE:  DATE OF DISCHARGE:                                 OPERATIVE REPORT   PREOPERATIVE DIAGNOSIS:  Chronic cholecystitis.   POSTOPERATIVE DIAGNOSIS:  Chronic cholecystitis.   PROCEDURE:  Laparoscopic cholecystectomy.   SURGEON:  Dirk Dress. Katrinka Blazing, M.D.   DESCRIPTION OF PROCEDURE:  Under general anesthesia, the patient's abdomen  was prepped and draped in a sterile field.   A supraumbilical midline incision was made.  The Veress needle was inserted  uneventfully.  The abdomen was insufflated with 2.5 L of CO2.  Using a  Visiport guide, a 10-mm port was placed uneventfully.  The laparoscope was  placed, and the gallbladder was visualized.  The patient was placed in  reverse Trendelenburg position.  A 10-mm port and two 5-mm ports were placed  in the right subcostal region.  The gallbladder was grasped by the assistant  and positioned.  The cystic duct was dissected, clipped with five clips, and  divided.  There were two cystic artery branches.  Each was dissected,  clipped with three clips, and divided.  The gallbladder was then separated  from the infrahepatic space with electrocautery.  It was retrieved.  The  abdomen was irrigated.  There was no leak.  The bed was dry.  CO2 was  allowed to escape from the abdomen, and the ports were removed.  The  incisions were closed using 0 Dexon on the fascia of the larger incisions  and staples on the skin.   The patient tolerated the procedure well.  Dressings were placed.  She was  awakened from anesthesia uneventfully, transferred to a bed, and taken to  the postanesthetic care unit for further monitoring.      ___________________________________________                                            Dirk Dress. Katrinka Blazing, M.D.   LCS/MEDQ  D:  07/14/2004  T:  07/14/2004  Job:  [id number]

## 2011-04-23 NOTE — H&P (Signed)
NAME:  Penny [name], Penny [name]                            ACCOUNT NO.:  [id number]   MEDICAL RECORD NO.:  [id number]                   PATIENT TYPE:  AMB   LOCATION:  DAY                                  FACILITY:  [facility]   PHYSICIAN:  Leroy C. Katrinka Blazing, M.D.                DATE OF BIRTH:  [birth year]   DATE OF ADMISSION:  DATE OF DISCHARGE:                                HISTORY & PHYSICAL   HISTORY OF PRESENT ILLNESS:  A 25 year old female with a history of  recurrent abdominal pain with nausea and occasional vomiting.  The pain is  in the right upper quadrant, radiating through to her back.  She has been  thoroughly evaluated by GI service.  Gallbladder and upper abdominal  ultrasound was unremarkable.  H pylori titer was positive, and she was  treated with Prevpac x2 weeks.  She continued to have symptoms.  A small  bowel follow through was done, and this was negative.  The patient has been  treated with Prilosec without improvement.  She also was treated with  Bentyl.  None of this made any difference in her symptoms.  She continues to  be severely symptomatic.  Symptoms are brought on by spicy and tomato based  foods.  She has had a decrease in appetite with some weight loss.  The  patient had a HIDA scan which showed an ejection fraction of 30% with  suggestion of  biliary dyskinesia.  With her classic history, physical  findings and abnormal HIDA scan, it is felt that the patient is a candidate  for laparoscopic cholecystectomy for severe biliary dyskinesia.   PAST MEDICAL HISTORY:  She has no other medical illness except for left hip  and low-back pain.  She has minimal bulging disk at L4-L5.   ALLERGIES:  None.   FAMILY HISTORY:  Positive for diabetes, pancreatitis.   ALLERGIES:  CODEINE causes some nausea and vomiting.   PHYSICAL EXAMINATION:  VITAL SIGNS:  Blood pressure 130/80, pulse 92,  respirations 16, weight 282 pounds.  HEENT:  Unremarkable.  NECK:  Supple, no JVD or bruits.  CHEST:  Clear to auscultation.  HEART:  Regular rate and rhythm without murmur, gallop or rub.  ABDOMEN:  Soft, nontender, no masses.  EXTREMITIES:  No cyanosis, clubbing or edema.  NEUROLOGIC:  No focal motor, sensory or cerebellar deficits.   IMPRESSION:  1. Recurrent biliary dyskinesia.  2. History of H pylori gastritis, treated, with resolution of symptoms.   PLAN:  Laparoscopic cholecystectomy.     ___________________________________________                                         Dirk Dress Katrinka Blazing, M.D.   LCS/MEDQ  D:  07/13/2004  T:  07/14/2004  Job:  [id number]

## 2011-05-03 ENCOUNTER — Emergency Department (HOSPITAL_COMMUNITY)
Admission: EM | Admit: 2011-05-03 | Discharge: 2011-05-04 | Disposition: A | Discharge status: Home / Self Care | Payer: Medicaid Other | Attending: Emergency Medicine | Admitting: Emergency Medicine

## 2011-05-03 DIAGNOSIS — G43909 Migraine, unspecified, not intractable, without status migrainosus: Secondary | ICD-10-CM | POA: Insufficient documentation

## 2011-05-08 ENCOUNTER — Emergency Department (HOSPITAL_COMMUNITY): Payer: Medicaid Other

## 2011-05-08 ENCOUNTER — Emergency Department (HOSPITAL_COMMUNITY)
Admission: EM | Admit: 2011-05-08 | Discharge: 2011-05-09 | Disposition: A | Discharge status: Home / Self Care | Payer: Medicaid Other | Attending: Emergency Medicine | Admitting: Emergency Medicine

## 2011-05-08 DIAGNOSIS — Z9884 Bariatric surgery status: Secondary | ICD-10-CM | POA: Insufficient documentation

## 2011-05-08 DIAGNOSIS — R110 Nausea: Secondary | ICD-10-CM | POA: Insufficient documentation

## 2011-05-08 DIAGNOSIS — Z79899 Other long term (current) drug therapy: Secondary | ICD-10-CM | POA: Insufficient documentation

## 2011-05-08 DIAGNOSIS — G43909 Migraine, unspecified, not intractable, without status migrainosus: Secondary | ICD-10-CM | POA: Insufficient documentation

## 2011-05-08 DIAGNOSIS — R112 Nausea with vomiting, unspecified: Secondary | ICD-10-CM | POA: Insufficient documentation

## 2011-05-08 DIAGNOSIS — R109 Unspecified abdominal pain: Secondary | ICD-10-CM | POA: Insufficient documentation

## 2011-05-08 LAB — URINALYSIS, ROUTINE W REFLEX MICROSCOPIC
Glucose, UA: NEGATIVE mg/dL
Hgb urine dipstick: NEGATIVE
Protein, ur: NEGATIVE mg/dL
pH: 6 (ref 5.0–8.0)

## 2011-05-08 LAB — PREGNANCY, URINE: Preg Test, Ur: NEGATIVE

## 2011-05-09 LAB — CBC
HCT: 26.6 % — ABNORMAL LOW (ref 36.0–46.0)
Hemoglobin: 8.1 g/dL — ABNORMAL LOW (ref 12.0–15.0)
MCV: 81.1 fL (ref 78.0–100.0)
RBC: 3.28 MIL/uL — ABNORMAL LOW (ref 3.87–5.11)
WBC: 6.2 10*3/uL (ref 4.0–10.5)

## 2011-05-09 LAB — BASIC METABOLIC PANEL
BUN: 10 mg/dL (ref 6–23)
CO2: 29 mEq/L (ref 19–32)
Chloride: 103 mEq/L (ref 96–112)
GFR calc non Af Amer: >60 mL/min (ref >60)
Glucose, Bld: 94 mg/dL (ref 70–99)
Potassium: 3.8 mEq/L (ref 3.5–5.1)
Sodium: 137 mEq/L (ref 135–145)

## 2011-05-09 LAB — DIFFERENTIAL
Basophils Absolute: 0 10*3/uL (ref 0.0–0.1)
Eosinophils Relative: 5 % (ref 0–5)
Lymphocytes Relative: 50 % — ABNORMAL HIGH (ref 12–46)
Lymphs Abs: 3.1 10*3/uL (ref 0.7–4.0)
Neutro Abs: 2.2 10*3/uL (ref 1.7–7.7)
Neutrophils Relative %: 36 % — ABNORMAL LOW (ref 43–77)

## 2011-05-09 MED ORDER — IOHEXOL 300 MG/ML  SOLN
100.0000 mL | Freq: Once | INTRAMUSCULAR | Status: AC | PRN
  Administered 2011-05-09: 100 mL via INTRAVENOUS

## 2011-05-27 ENCOUNTER — Encounter: Payer: Self-pay | Admitting: Gastroenterology

## 2011-05-27 ENCOUNTER — Ambulatory Visit (INDEPENDENT_AMBULATORY_CARE_PROVIDER_SITE_OTHER): Payer: Medicaid Other | Admitting: Gastroenterology

## 2011-05-27 DIAGNOSIS — R197 Diarrhea, unspecified: Secondary | ICD-10-CM

## 2011-05-27 DIAGNOSIS — D649 Anemia, unspecified: Secondary | ICD-10-CM

## 2011-05-27 NOTE — Progress Notes (Signed)
Subjective:    Patient ID: Penny [name], female    DOB: Apr 27, 1986, 25 y.o.   MRN: [id number]  PCP:  HPI Started achy 2.5 mos ago and got worse and came to ED. Pain in epigastrium and the radiating to the RLQ. Worse in the RLQ. Really sharp that day. Still feels like she has GB pain in the RUQ. Has nausea and vomiting but it worse that day.  No blood in vomit or  Stool. Had dark stool and looked like took [employer]. Saw one time on day she went to the ED. Mostly watery diarrhea often but it was worse that day. Bms: 5-6.day. Up to have early AM stool at 0730. Weight gain: 25-30 lbs over 2 years. Weight down 12 lbs since July 2011. No problems swallowing.  Indigestion: burning in her chest, 1-2 times, usu takes OMP prn. Some days don't eat and some days she grazes. Drinks: Gatorade diet, [employer]. Has severe depression and anxiety and doesn't see a [facility]. No milk 2o to vomiting even Lactaid. No ice cream. Can tolerate cheese. Snack: PB/cheese-crackers. GAB MAR 2007-mini.  Past Medical History  Diagnosis Date  . Migraines   . Interstitial cystitis   . IUD FEB 2010  . Gastritis JULY 2011  . Depression   . 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 Fatty liver   . Obesity (BMI 30-39.9) 2011 228 LBS BMI 36.8   Past Surgical History  Procedure Date  . Gab 2007    in [place]-POUCH 5 CM  . Cholecystectomy 2005    biliary dyskinesia  . Upper gastrointestinal endoscopy JULY 2011 NAUSEA-D125,V6, PH 25    Bx; GASTRITIS, POUCH-5 CM LONG   Allergies  Allergen Reactions  . Codeine     VOMTIING  . Gabapentin     UNRESPONSIVE BUT VITALS STABLE  . Latex     RASH  . Reglan     FEELS LIKE SHE'S IN A BOX AND CAN'T GET OUT   Current Outpatient Prescriptions  Medication Sig Dispense Refill  . clonazePAM (KLONOPIN) 0.5 MG tablet Take 0.5 mg by mouth 2 (two) times daily as needed.        . DULoxetine (CYMBALTA) 60 MG capsule Take 60 mg by mouth daily.        Marland Kitchen HYDROcodone-acetaminophen  (NORCO) 10-325 MG per tablet Take 1 tablet by mouth every 6 (six) hours as needed.        . loratadine (CLARITIN) 10 MG tablet Take 10 mg by mouth daily.        . Ondansetron HCl (ZOFRAN PO) Take 8 mg by mouth.       . phenazopyridine (PYRIDIUM) 200 MG tablet Take 200 mg by mouth 3 (three) times daily as needed.        . promethazine (PHENERGAN) 25 MG tablet Take 25 mg by mouth every 6 (six) hours as needed.        . SUMAtriptan (IMITREX) 100 MG tablet Take 100 mg by mouth every 2 (two) hours as needed.        Marland Kitchen tiZANidine (ZANAFLEX) 4 MG capsule Take 4 mg by mouth 3 (three) times daily.        . Diethylpropion HCl 25 MG TABS Take by mouth.        . lidocaine (XYLOCAINE) 2 % solution Take 20 mLs by mouth as needed.        . Milnacipran HCl (SAVELLA PO) Take by mouth.        Marland Kitchen  pentosan polysulfate (ELMIRON) 100 MG capsule Take 100 mg by mouth 3 (three) times daily before meals.        Marland Kitchen TRIMETHOPRIM PO Take by mouth.          Family History  Problem Relation Age of Onset  . Hemochromatosis Maternal Grandmother       Review of Systems  All other systems reviewed and are negative.       Objective:   Physical Exam  Vitals reviewed. Constitutional: She appears well-developed and well-nourished. No distress.  HENT:  Head: Normocephalic and atraumatic.  Eyes: Pupils are equal, round, and reactive to light.  Neck: Normal range of motion. Neck supple.  Cardiovascular: Normal rate, regular rhythm and normal heart sounds.   Pulmonary/Chest: Effort normal and breath sounds normal.  Abdominal: Soft. Bowel sounds are normal. She exhibits no distension. There is Tenderness: MILD TTP IN RLQ.Marland Kitchen There is no rebound and no guarding.  Musculoskeletal: She exhibits no edema.  Lymphadenopathy:    She has no cervical adenopathy.  Neurological: She is alert.  Skin: No rash noted.  Psychiatric:       DEPRESSED MOOD          Assessment & Plan:

## 2011-05-27 NOTE — Assessment & Plan Note (Addendum)
Normocytic anemia-differential diagnosis includes chronic disease or FeDA.  CBC/ferritin-TTG [place] See [name].

## 2011-05-27 NOTE — Assessment & Plan Note (Addendum)
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 Differential diagnosis include IBD, microcolitis, celiac dz, and less likely infectious etiology.  Stool studies/labs TCS June 28 w/ propofol 2o to polyphahrmacy MH referral OPV in 3 mos

## 2011-05-28 ENCOUNTER — Other Ambulatory Visit: Payer: Self-pay | Admitting: Gastroenterology

## 2011-05-29 LAB — FECAL LACTOFERRIN, QUANT: Lactoferrin: POSITIVE

## 2011-05-31 ENCOUNTER — Encounter: Payer: Self-pay | Admitting: Gastroenterology

## 2011-05-31 LAB — GIARDIA ANTIGEN: Giardia Screen (EIA): NEGATIVE

## 2011-06-01 NOTE — Progress Notes (Signed)
Call pt. Remind her to submit stool studies.

## 2011-06-02 ENCOUNTER — Encounter (HOSPITAL_COMMUNITY)
Admission: RE | Admit: 2011-06-02 | Discharge: 2011-06-02 | Disposition: A | Discharge status: Home / Self Care | Payer: Medicaid Other | Source: Ambulatory Visit | Attending: Gastroenterology | Admitting: Gastroenterology

## 2011-06-02 LAB — DIFFERENTIAL
Basophils Absolute: 0 K/uL (ref 0.0–0.1)
Basophils Relative: 0 % (ref 0–1)
Eosinophils Absolute: 0.2 K/uL (ref 0.0–0.7)
Eosinophils Relative: 4 % (ref 0–5)
Lymphocytes Relative: 54 % — ABNORMAL HIGH (ref 12–46)
Lymphs Abs: 3.1 K/uL (ref 0.7–4.0)
Monocytes Absolute: 0.5 K/uL (ref 0.1–1.0)
Monocytes Relative: 8 % (ref 3–12)
Neutro Abs: 1.9 K/uL (ref 1.7–7.7)
Neutrophils Relative %: 34 % — ABNORMAL LOW (ref 43–77)

## 2011-06-02 LAB — CBC
HCT: 27.8 % — ABNORMAL LOW (ref 36.0–46.0)
Hemoglobin: 8.6 g/dL — ABNORMAL LOW (ref 12.0–15.0)
MCV: 79.9 fL (ref 78.0–100.0)
RBC: 3.48 MIL/uL — ABNORMAL LOW (ref 3.87–5.11)
RDW: 17.6 % — ABNORMAL HIGH (ref 11.5–15.5)
WBC: 5.7 10*3/uL (ref 4.0–10.5)

## 2011-06-03 ENCOUNTER — Other Ambulatory Visit: Payer: Self-pay | Admitting: Gastroenterology

## 2011-06-03 ENCOUNTER — Ambulatory Visit (HOSPITAL_COMMUNITY)
Admission: RE | Admit: 2011-06-03 | Discharge: 2011-06-03 | Disposition: A | Discharge status: Home / Self Care | Payer: Medicaid Other | Source: Ambulatory Visit | Attending: Gastroenterology | Admitting: Gastroenterology

## 2011-06-03 ENCOUNTER — Encounter: Payer: Medicaid Other | Admitting: Gastroenterology

## 2011-06-03 DIAGNOSIS — R109 Unspecified abdominal pain: Secondary | ICD-10-CM

## 2011-06-03 DIAGNOSIS — R197 Diarrhea, unspecified: Secondary | ICD-10-CM

## 2011-06-03 DIAGNOSIS — K625 Hemorrhage of anus and rectum: Secondary | ICD-10-CM

## 2011-06-03 DIAGNOSIS — Z01812 Encounter for preprocedural laboratory examination: Secondary | ICD-10-CM | POA: Insufficient documentation

## 2011-06-03 LAB — FERRITIN: Ferritin: 2 ng/mL — ABNORMAL LOW (ref 10–291)

## 2011-06-07 NOTE — Progress Notes (Signed)
Cc to Dr. Scott Luking 

## 2011-06-07 NOTE — Progress Notes (Signed)
Cc to [name]

## 2011-06-08 ENCOUNTER — Encounter: Payer: Self-pay | Admitting: Gastroenterology

## 2011-06-10 NOTE — Progress Notes (Signed)
Results Cc to Dr. Gerda Diss

## 2011-06-14 ENCOUNTER — Encounter: Payer: Self-pay | Admitting: General Practice

## 2011-06-14 NOTE — Progress Notes (Signed)
F/u opv in sept is in the computer and cc path to PCP

## 2011-06-14 NOTE — Progress Notes (Signed)
I spoke with the pt and gave her an appt to see Dr. Thornton Papas PA on 06/22/2011@11 :30am.  I told the pt that according to Dr Thornton Papas office she had a history of "NO SHOWS" .  I explained to her that she must keep this appt of she could be d/c from their clinic.  Pt voiced understanding.

## 2011-06-19 ENCOUNTER — Emergency Department (HOSPITAL_COMMUNITY)
Admission: EM | Admit: 2011-06-19 | Discharge: 2011-06-20 | Disposition: A | Discharge status: Home / Self Care | Payer: Medicaid Other | Attending: Emergency Medicine | Admitting: Emergency Medicine

## 2011-06-19 ENCOUNTER — Encounter (HOSPITAL_COMMUNITY): Payer: Self-pay

## 2011-06-19 DIAGNOSIS — R51 Headache: Secondary | ICD-10-CM | POA: Insufficient documentation

## 2011-06-19 DIAGNOSIS — R110 Nausea: Secondary | ICD-10-CM | POA: Insufficient documentation

## 2011-06-19 DIAGNOSIS — F17200 Nicotine dependence, unspecified, uncomplicated: Secondary | ICD-10-CM | POA: Insufficient documentation

## 2011-06-19 MED ORDER — SODIUM CHLORIDE 0.9 % IV SOLN
Freq: Once | INTRAVENOUS | Status: AC
  Administered 2011-06-19: 23:00:00 via INTRAVENOUS

## 2011-06-19 NOTE — ED Notes (Signed)
Migraine for 3 days, also low back pain

## 2011-06-19 NOTE — ED Notes (Signed)
Attempt x 2 for iv access without success.

## 2011-06-20 MED ORDER — DIPHENHYDRAMINE HCL 50 MG/ML IJ SOLN
25.0000 mg | Freq: Once | INTRAMUSCULAR | Status: AC
  Administered 2011-06-20: 50 mg via INTRAVENOUS
  Filled 2011-06-20: qty 1

## 2011-06-20 MED ORDER — HYDROMORPHONE HCL 1 MG/ML IJ SOLN
1.0000 mg | Freq: Once | INTRAMUSCULAR | Status: AC
  Administered 2011-06-20: 1 mg via INTRAVENOUS
  Filled 2011-06-20: qty 1

## 2011-06-20 MED ORDER — SODIUM CHLORIDE 0.9 % IV SOLN
Freq: Once | INTRAVENOUS | Status: AC
  Administered 2011-06-20: 01:00:00 via INTRAVENOUS

## 2011-06-20 MED ORDER — KETOROLAC TROMETHAMINE 30 MG/ML IJ SOLN
30.0000 mg | Freq: Once | INTRAMUSCULAR | Status: AC
  Administered 2011-06-20: 30 mg via INTRAVENOUS
  Filled 2011-06-20: qty 1

## 2011-06-20 MED ORDER — HYDROMORPHONE HCL 1 MG/ML IJ SOLN
0.5000 mg | Freq: Once | INTRAMUSCULAR | Status: AC
  Administered 2011-06-20: 1 mg via INTRAVENOUS
  Filled 2011-06-20: qty 1

## 2011-06-20 MED ORDER — ONDANSETRON HCL 4 MG/2ML IJ SOLN
4.0000 mg | Freq: Once | INTRAMUSCULAR | Status: AC
  Administered 2011-06-20: 4 mg via INTRAVENOUS
  Filled 2011-06-20: qty 2

## 2011-06-20 MED ORDER — SUMATRIPTAN SUCCINATE 6 MG/0.5ML ~~LOC~~ SOLN
6.0000 mg | Freq: Once | SUBCUTANEOUS | Status: AC
  Administered 2011-06-20: 6 mg via SUBCUTANEOUS
  Filled 2011-06-20: qty 0.5

## 2011-06-20 NOTE — ED Provider Notes (Signed)
History     Chief Complaint  Patient presents with  . Migraine   HPI Comments: Patient with h/o migraines began with headache earlier today. Took imitrex with no relief. Has been seen in the last two weeks by neurologist, Dr. Anne Hahn. PMD is Dr. [name] who normally manages the migraines. Headache is located across forehead with sharp pains to the interior. Similar to previous headaches. Denies fever, chills, vision changes, hearing changes, stiff neck. Denies chest pain, shortness of breath. Has had nausea, no vomiting.  Patient is a 25 y.o. female presenting with migraine. The history is provided by the patient.  Migraine This is a chronic problem. The current episode started 2 days ago. The problem occurs constantly. The problem has not changed since onset.Associated symptoms comments: Nausea and dry heaves. Treatments tried: triptan without relief.    Past Medical History  Diagnosis Date  . Migraines   . Interstitial cystitis   . IUD FEB 2010  . Gastritis JULY 2011  . Depression   . 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 Fatty liver   . Obesity (BMI 30-39.9) 2011 228 LBS BMI 36.8    Past Surgical History  Procedure Date  . Gab 2007    in [place]-POUCH 5 CM  . Cholecystectomy 2005    biliary dyskinesia  . Upper gastrointestinal endoscopy JULY 2011 NAUSEA-D125,V6, PH 25    Bx; GASTRITIS, POUCH-5 CM LONG  . Colonoscopy     Family History  Problem Relation Age of Onset  . Hemochromatosis Maternal Grandmother     History  Substance Use Topics  . Smoking status: Current Everyday Smoker -- 0.5 packs/day    Types: Cigarettes  . Smokeless tobacco: Not on file  . Alcohol Use: No    OB History    Grav Para Term Preterm Abortions TAB SAB Ect Mult Living                  Review of Systems  All other systems reviewed and are negative.    Physical Exam  BP 136/81  Pulse 65  Temp(Src) 98.5 F (36.9 C) (Oral)  Resp 18  Ht 5\' 6"  (1.676 m)  Wt 220 lb  (99.791 kg)  BMI 35.51 kg/m2  SpO2 99%  Physical Exam  Constitutional: She is oriented to person, place, and time. She appears well-developed and well-nourished. No distress.  HENT:  Head: Normocephalic and atraumatic.  Right Ear: External ear normal.  Left Ear: External ear normal.  Nose: Nose normal.  Mouth/Throat: Oropharynx is clear and moist.  Eyes: Conjunctivae and EOM are normal. Pupils are equal, round, and reactive to light.  Neck: Normal range of motion. Neck supple.       No menigeal  signs  Cardiovascular: Normal rate, normal heart sounds and intact distal pulses.   Pulmonary/Chest: Effort normal and breath sounds normal.  Abdominal: Soft. Bowel sounds are normal.  Musculoskeletal: Normal range of motion.  Neurological: She is alert and oriented to person, place, and time. She has normal reflexes. No cranial nerve deficit or sensory deficit. Coordination and gait normal. GCS eye subscore is 4. GCS verbal subscore is 5. GCS motor subscore is 6.       No facial droop, nystagmus, pronator drift, focal neurological signs    ED Course  Procedures  MDM       Nicoletta Dress. Colon Branch, MD 06/20/11 5366

## 2011-06-20 NOTE — Progress Notes (Signed)
0454 Patient with improving headache. 2nd liter IVF ordered. Additional analgesic ordered. 0981 Patient stated that headache had improved and was now returning. Agreed to imitrex and discharge home.

## 2011-06-22 ENCOUNTER — Encounter (HOSPITAL_COMMUNITY): Payer: Self-pay | Admitting: Oncology

## 2011-06-22 ENCOUNTER — Encounter (HOSPITAL_COMMUNITY): Payer: Medicaid Other | Attending: Oncology | Admitting: Oncology

## 2011-06-22 ENCOUNTER — Other Ambulatory Visit (HOSPITAL_COMMUNITY): Payer: Self-pay | Admitting: Oncology

## 2011-06-22 VITALS — BP 137/84 | HR 82 | Temp 98.4°F | Wt 220.6 lb

## 2011-06-22 DIAGNOSIS — Z9884 Bariatric surgery status: Secondary | ICD-10-CM | POA: Insufficient documentation

## 2011-06-22 DIAGNOSIS — D509 Iron deficiency anemia, unspecified: Secondary | ICD-10-CM

## 2011-06-22 LAB — CBC
Hemoglobin: 9 g/dL — ABNORMAL LOW (ref 12.0–15.0)
MCH: 24.6 pg — ABNORMAL LOW (ref 26.0–34.0)
MCHC: 30.8 g/dL (ref 30.0–36.0)
MCV: 79.8 fL (ref 78.0–100.0)
RBC: 3.66 MIL/uL — ABNORMAL LOW (ref 3.87–5.11)

## 2011-06-22 LAB — DIFFERENTIAL
Basophils Relative: 0 % (ref 0–1)
Eosinophils Absolute: 0.3 10*3/uL (ref 0.0–0.7)
Eosinophils Relative: 5 % (ref 0–5)
Lymphs Abs: 2.8 10*3/uL (ref 0.7–4.0)
Monocytes Absolute: 0.8 10*3/uL (ref 0.1–1.0)
Monocytes Relative: 14 % — ABNORMAL HIGH (ref 3–12)
Neutrophils Relative %: 34 % — ABNORMAL LOW (ref 43–77)

## 2011-06-22 LAB — COMPREHENSIVE METABOLIC PANEL
Albumin: 3.9 g/dL (ref 3.5–5.2)
Alkaline Phosphatase: 118 U/L — ABNORMAL HIGH (ref 39–117)
BUN: 4 mg/dL — ABNORMAL LOW (ref 6–23)
Calcium: 9.4 mg/dL (ref 8.4–10.5)
Creatinine, Ser: 0.53 mg/dL (ref 0.50–1.10)
GFR calc Af Amer: >60 mL/min (ref >60)
Glucose, Bld: 73 mg/dL (ref 70–99)
Potassium: 3.3 mEq/L — ABNORMAL LOW (ref 3.5–5.1)
Total Protein: 7.6 g/dL (ref 6.0–8.3)

## 2011-06-22 LAB — RETICULOCYTES
RBC.: 3.66 MIL/uL — ABNORMAL LOW (ref 3.87–5.11)
Retic Count, Absolute: 69.5 10*3/uL (ref 19.0–186.0)
Retic Ct Pct: 1.9 % (ref 0.4–3.1)

## 2011-06-22 MED ORDER — SODIUM CHLORIDE 0.9 % IV SOLN
1020.0000 mg | Freq: Once | INTRAVENOUS | Status: AC
  Administered 2011-06-22: 1020 mg via INTRAVENOUS
  Filled 2011-06-22: qty 34

## 2011-06-22 NOTE — Patient Instructions (Signed)
[facility]  Discharge Instructions  RECOMMENDATIONS MADE BY THE CONSULTANT AND ANY TEST RESULTS WILL BE SENT TO YOUR REFERRING DOCTOR.   EXAM FINDINGS BY MD TODAY AND SIGNS AND SYMPTOMS TO REPORT TO CLINIC OR PRIMARY MD: Discussed by PA      SPECIAL INSTRUCTIONS/FOLLOW-UP: Return to Clinic on as per schedule   I acknowledge that I have been informed and understand all the instructions given to me and received a copy. I do not have any more questions at this time, but understand that I may call the Specialty Clinic at [facility] at [phone] during business hours should I have any further questions or need assistance in obtaining follow-up care.    __________________________________________  _____________  __________ Signature of Patient or Authorized Representative            Date                   Time    __________________________________________ Nurse's Signature

## 2011-06-22 NOTE — Progress Notes (Signed)
[facility] Initial Outpatient Consultation  Name: Penny [name]        MRN: [id number]      Date: 06/22/2011    DOB:1986-11-16    REFERRING PHYSICIAN:   Dr. Jonette Eva   REASON FOR REFERRAL:  Consultation for [name] requested by Dr. Darrick Penna for evaluation and discussion of treatment options for Iron Deficiency Anemia   HISTORY OF PRESENT ILLNESS:Penny [name] is a 25 y.o. female who presents as a referral by Dr. Jonette Eva.  She is here for evaluation of iron deficiency anemia.  The patient reports that one year ago she was seen by Dr. Darrick Penna in the ER.  An EGD was performed which revealed gastric erosions.  She was followed up by Dr. Darrick Penna.  Most recently she went to the ER due to abdominal pain.  Work-up in the ER revealed a microcytic anemia.  She was seen by Dr. Darrick Penna who performed a colonoscopy which according to the patient did not reveal any abnormalities.  I do not have that note readily available.    The patient presents with a microcytic anemia and a ferritin on 2.  As a result she was sent to the [facility] for further evaluation.  The patient reports that she underwent a "mini" gastric bypass surgery for obesity.  She also admits to receiving IV Iron infusions in the past around her time of pregnancy (7 months pregnant and 3 months following delivery).  She denies every having blood transfusion.  The patient is no longer menstruating because she has an IUD in place.  Patient admits to ice cravings (Sonic's ice is the best).  She denies any dirt or clay cravings.  Denies any headaches, dizziness, double vision, fevers, chills, night sweats, nausea, vomiting, diarrhea, constipation, chest pain, heart palpitations, shortness of breath, blood in stool, black tarry stool, urinary pain, urinary burning, urinary frequency, hematuria.  Constitutional: negative for chills, fatigue, fevers, night sweats and weight loss Ears, nose, mouth, throat, and  face: negative for epistaxis, nasal congestion and sore mouth Respiratory: negative for cough, hemoptysis and sputum Cardiovascular: negative for chest pain, chest pressure/discomfort, dyspnea and palpitations Gastrointestinal: negative except for abdominal pain Genitourinary:negative for abnormal menstrual periods Integument/breast: negative Hematologic/lymphatic: negative for bleeding, easy bruising, lymphadenopathy and petechiae Musculoskeletal:negative Neurological: negative for dizziness Behavioral/Psych: positive for depression   PAST MEDICAL HISTORY:    has a past medical history of Migraines; Interstitial cystitis; IUD (FEB 2010); Gastritis (JULY 2011); Depression; Anemia (2011); Fatty liver; Obesity (BMI 30-39.9) (2011 228 LBS BMI 36.8); and Iron deficiency anemia (07/23/2010).   ALLERGIES: is allergic to codeine; gabapentin; latex; and reglan.     MEDICATIONS:  We administered ferumoxytol.    PAST SURGICAL HISTORY Past Surgical History  Procedure Date  . Gab 2007    in [place]-POUCH 5 CM  . Cholecystectomy 2005    biliary dyskinesia  . Upper gastrointestinal endoscopy JULY 2011 NAUSEA-D125,V6, PH 25    Bx; GASTRITIS, POUCH-5 CM LONG  . Colonoscopy   . Cath self every nite     for sodium bicarb injection     FAMILY HISTORY: Family History  Problem Relation Age of Onset  . Hemochromatosis Maternal Grandmother      SOCIAL HISTORY:  reports that she has been smoking Cigarettes.  She has been smoking about .5 packs per day. She does not have any smokeless tobacco history on file. She reports that she does not drink alcohol or use illicit drugs.  GYN HISTORY: OB History    Grav Para Term Preterm Abortions TAB SAB Ect Mult Living                  PERFORMANCE STATUS: The patient's performance status is 1 - Symptomatic but completely ambulatory   PHYSICAL EXAM: Most Recent Vital Signs: Blood pressure 137/84, pulse 82, temperature 98.4 F (36.9 C),  temperature source Oral, weight 220 lb 9.6 oz (100.064 kg). General appearance: alert, cooperative, appears stated age, fatigued, no distress, moderately obese and pale Head: Normocephalic, without obvious abnormality, atraumatic Eyes: positive findings: conjunctiva: clear, pale Throat: lips, mucosa, and tongue normal; teeth and gums normal Neck: no adenopathy, no carotid bruit, no JVD, supple, symmetrical, trachea midline and thyroid not enlarged, symmetric, no tenderness/mass/nodules Lungs: clear to auscultation bilaterally and normal percussion bilaterally Heart: regular rate and rhythm, S1, S2 normal, no murmur, click, rub or gallop Abdomen: soft, non-tender; bowel sounds normal; no masses,  no organomegaly Extremities: extremities normal, atraumatic, no cyanosis or edema and no edema, redness or tenderness in the calves or thighs Pulses: 2+ and symmetric Skin: pale Lymph nodes: Cervical, supraclavicular, and axillary nodes normal. and no inguinal nodes Neurologic: Grossly normal   LABORATORY DATA:  Lab Results  Component Value Date   WBC 5.7 06/02/2011   HGB 8.6* 06/02/2011   HCT 27.8* 06/02/2011   MCV 79.9 06/02/2011   PLT 243 06/02/2011   Lab Results  Component Value Date   FERRITIN 2* 06/02/2011   Lab Results  Component Value Date   IRON 38* 07/02/2010   TIBC 353 07/25/2009   FERRITIN 2* 06/02/2011   Lab Results  Component Value Date   ALT 321* 07/20/2010   AST 251* 07/20/2010   ALKPHOS 127* 07/20/2010   BILITOT 0.6 07/20/2010   ASSESSMENT:  1. Iron Deficiency Anemia with a Ferritin on 2, likely secondary to poor oral absorption due to gastric bypass surgery. 2. Interstitial cystitis 3. Depression 4. Transaminitis 5. Migraines  PLAN:  1. 1020 mg Feraheme today 2. CBC diff, Anemia panel, Retic Count, CMET today 3. CBC, Ferritin in one month and three months 4. Return to the clinic in three months  All questions were answered. The patient knows to call the clinic with  any problems, questions or concerns. We can certainly see the patient much sooner if necessary.  The patient and plan discussed with Glenford Peers, MD and he is in agreement with the aforementioned.  I spent 50 minutes counseling the patient face to face. The total time spent in the appointment was 70 minutes.   KEFALAS,THOMAS

## 2011-06-23 ENCOUNTER — Encounter (HOSPITAL_COMMUNITY): Payer: Self-pay | Admitting: Oncology

## 2011-06-23 LAB — IRON AND TIBC: Saturation Ratios: 7 % — ABNORMAL LOW (ref 20–55)

## 2011-06-23 LAB — FOLATE: Folate: 4.2 ng/mL

## 2011-06-23 LAB — VITAMIN B12: Vitamin B-12: 250 pg/mL (ref 211–911)

## 2011-06-24 ENCOUNTER — Emergency Department (HOSPITAL_COMMUNITY)
Admission: EM | Admit: 2011-06-24 | Discharge: 2011-06-24 | Disposition: A | Discharge status: Home / Self Care | Payer: Medicaid Other | Attending: Emergency Medicine | Admitting: Emergency Medicine

## 2011-06-24 ENCOUNTER — Encounter (HOSPITAL_COMMUNITY): Payer: Self-pay | Admitting: Emergency Medicine

## 2011-06-24 DIAGNOSIS — Z6830 Body mass index (BMI) 30.0-30.9, adult: Secondary | ICD-10-CM | POA: Insufficient documentation

## 2011-06-24 DIAGNOSIS — Z9884 Bariatric surgery status: Secondary | ICD-10-CM | POA: Insufficient documentation

## 2011-06-24 DIAGNOSIS — E669 Obesity, unspecified: Secondary | ICD-10-CM | POA: Insufficient documentation

## 2011-06-24 DIAGNOSIS — G43909 Migraine, unspecified, not intractable, without status migrainosus: Secondary | ICD-10-CM

## 2011-06-24 DIAGNOSIS — F17200 Nicotine dependence, unspecified, uncomplicated: Secondary | ICD-10-CM | POA: Insufficient documentation

## 2011-06-24 DIAGNOSIS — D509 Iron deficiency anemia, unspecified: Secondary | ICD-10-CM | POA: Insufficient documentation

## 2011-06-24 LAB — CBC
Hemoglobin: 9 g/dL — ABNORMAL LOW (ref 12.0–15.0)
MCH: 25.1 pg — ABNORMAL LOW (ref 26.0–34.0)
Platelets: 269 10*3/uL (ref 150–400)
RBC: 3.58 MIL/uL — ABNORMAL LOW (ref 3.87–5.11)
WBC: 5.3 10*3/uL (ref 4.0–10.5)

## 2011-06-24 LAB — BASIC METABOLIC PANEL
CO2: 27 mEq/L (ref 19–32)
Calcium: 9.4 mg/dL (ref 8.4–10.5)
Glucose, Bld: 146 mg/dL — ABNORMAL HIGH (ref 70–99)
Potassium: 4.3 mEq/L (ref 3.5–5.1)
Sodium: 137 mEq/L (ref 135–145)

## 2011-06-24 LAB — URINALYSIS, ROUTINE W REFLEX MICROSCOPIC
Glucose, UA: NEGATIVE mg/dL
Ketones, ur: NEGATIVE mg/dL
Leukocytes, UA: NEGATIVE
Nitrite: NEGATIVE
Specific Gravity, Urine: 1.015 (ref 1.005–1.030)
pH: 7 (ref 5.0–8.0)

## 2011-06-24 LAB — POCT PREGNANCY, URINE: Preg Test, Ur: NEGATIVE

## 2011-06-24 LAB — URINE MICROSCOPIC-ADD ON

## 2011-06-24 MED ORDER — HYDROMORPHONE HCL 1 MG/ML IJ SOLN
1.0000 mg | Freq: Once | INTRAMUSCULAR | Status: AC
  Administered 2011-06-24: 1 mg via INTRAVENOUS
  Filled 2011-06-24 (×2): qty 1

## 2011-06-24 MED ORDER — PROMETHAZINE HCL 25 MG/ML IJ SOLN
12.5000 mg | Freq: Once | INTRAMUSCULAR | Status: AC
  Administered 2011-06-24: 12.5 mg via INTRAVENOUS
  Filled 2011-06-24: qty 1

## 2011-06-24 MED ORDER — DROPERIDOL 2.5 MG/ML IJ SOLN
2.5000 mg | Freq: Once | INTRAMUSCULAR | Status: AC
  Administered 2011-06-24: 5 mg via INTRAVENOUS
  Filled 2011-06-24: qty 2

## 2011-06-24 MED ORDER — DEXAMETHASONE SODIUM PHOSPHATE 4 MG/ML IJ SOLN
8.0000 mg | Freq: Once | INTRAMUSCULAR | Status: AC
  Administered 2011-06-24: 8 mg via INTRAVENOUS
  Filled 2011-06-24 (×2): qty 2

## 2011-06-24 MED ORDER — HYDROMORPHONE HCL 1 MG/ML IJ SOLN
1.0000 mg | Freq: Once | INTRAMUSCULAR | Status: AC
  Administered 2011-06-24: 1 mg via INTRAVENOUS
  Filled 2011-06-24: qty 1

## 2011-06-24 MED ORDER — SODIUM CHLORIDE 0.9 % IV BOLUS (SEPSIS)
1000.0000 mL | Freq: Once | INTRAVENOUS | Status: AC
  Administered 2011-06-24: 1000 mL via INTRAVENOUS

## 2011-06-24 MED ORDER — KETOROLAC TROMETHAMINE 30 MG/ML IJ SOLN
30.0000 mg | Freq: Once | INTRAMUSCULAR | Status: AC
  Administered 2011-06-24: 30 mg via INTRAVENOUS
  Filled 2011-06-24: qty 1

## 2011-06-24 MED ORDER — PROMETHAZINE HCL 12.5 MG PO TABS
12.5000 mg | ORAL_TABLET | Freq: Once | ORAL | Status: DC
  Filled 2011-06-24: qty 1

## 2011-06-24 MED ORDER — OXYCODONE-ACETAMINOPHEN 5-325 MG PO TABS: 1.0000 | ORAL_TABLET | Freq: Four times a day (QID) | ORAL | Status: AC | PRN

## 2011-06-24 NOTE — ED Notes (Signed)
Pt c/o headache with n/v since last night.

## 2011-06-24 NOTE — Progress Notes (Signed)
Pt reports headache back up to 8 or 9. Discussed case wth Dr Radford Pax. Will try toradol, droperidol, IV bolus, and oxygen. If not successful will consult hospitalist for admission.

## 2011-06-24 NOTE — ED Notes (Signed)
PA informed of pain rated at 6 and pts request for saltines. Pt given saltines. Pt to be admitted. NAD.

## 2011-06-24 NOTE — ED Notes (Signed)
Pt is sleeping at this time. Family member also sleeping. VSS. O2 remains in place.

## 2011-06-24 NOTE — Progress Notes (Signed)
Pt was able to get up an go to BR. No vomiting. Pain somewhat improved. Pt to be d/c home. Family advised to return if needed.

## 2011-06-24 NOTE — ED Notes (Signed)
Attempted IV x 2 with no success. 2nd RN to attempt.

## 2011-06-24 NOTE — ED Notes (Signed)
Upon entering room, pt is sound asleep, appears comfortable, when questioned, pt states pain is still 7/10 and then falls immed back to sleep

## 2011-06-24 NOTE — Progress Notes (Signed)
After IV fluids, oxygen, and droperdol, pt continue to have 7/10 pain when awaken. Hospitalist called for possible admission.

## 2011-06-24 NOTE — ED Notes (Signed)
Pt drowsy, 02sat 90% on room air. Placed pt on 02 at 2L via [place] and o2 sat 94%.

## 2011-06-24 NOTE — Progress Notes (Signed)
Pt states she continues to have a great deal of pain. Only minimal relief from previous pain meds. IV dilaudid and promethazine given.

## 2011-06-24 NOTE — ED Notes (Signed)
Patient is in a deep sleep with family at bedside asked for a urine and patient could not wake up enough to get out of bed. Will notify the PA nurse [facility] aware.

## 2011-06-24 NOTE — ED Provider Notes (Addendum)
History     Chief Complaint  Patient presents with  . Headache   Patient is a 25 y.o. female presenting with migraine. The history is provided by the patient.  Migraine This is a recurrent problem. The current episode started yesterday. The problem occurs constantly. The problem has been gradually worsening. Associated symptoms include headaches and nausea. Pertinent negatives include no abdominal pain, arthralgias, change in bowel habit, chest pain, coughing, neck pain or vomiting. The symptoms are aggravated by bending, exertion and walking.  Migraine This is a recurrent problem. The current episode started yesterday. The problem occurs constantly. The problem has been gradually worsening. Associated symptoms include headaches. Pertinent negatives include no chest pain, no abdominal pain and no shortness of breath. The symptoms are aggravated by bending, exertion and walking.    Past Medical History  Diagnosis Date  . Migraines   . Interstitial cystitis   . IUD FEB 2010  . Gastritis JULY 2011  . Depression   . 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 Fatty liver   . Obesity (BMI 30-39.9) 2011 228 LBS BMI 36.8  . Iron deficiency anemia 07/23/2010    Past Surgical History  Procedure Date  . Gab 2007    in [place]-POUCH 5 CM  . Cholecystectomy 2005    biliary dyskinesia  . Upper gastrointestinal endoscopy JULY 2011 NAUSEA-D125,V6, PH 25    Bx; GASTRITIS, POUCH-5 CM LONG  . Colonoscopy   . Cath self every nite     for sodium bicarb injection    Family History  Problem Relation Age of Onset  . Hemochromatosis Maternal Grandmother     History  Substance Use Topics  . Smoking status: Current Everyday Smoker -- 0.5 packs/day    Types: Cigarettes  . Smokeless tobacco: Not on file  . Alcohol Use: No    OB History    Grav Para Term Preterm Abortions TAB SAB Ect Mult Living   1 1 1       1       Review of Systems  Constitutional: Negative for activity change.   All ROS Neg except as noted in HPI  HENT: Negative for nosebleeds and neck pain.   Eyes: Negative for photophobia and discharge.  Respiratory: Negative for cough, shortness of breath and wheezing.   Cardiovascular: Negative for chest pain and palpitations.  Gastrointestinal: Positive for nausea. Negative for vomiting, abdominal pain, blood in stool and change in bowel habit.  Genitourinary: Negative for dysuria, frequency and hematuria.  Musculoskeletal: Negative for back pain and arthralgias.  Skin: Negative.   Neurological: Positive for headaches. Negative for dizziness, seizures and speech difficulty.  Psychiatric/Behavioral: Negative for hallucinations and confusion.    Physical Exam  BP 120/72  Pulse 89  Temp(Src) 98 F (36.7 C) (Oral)  Resp 17  Ht 5\' 6"  (1.676 m)  Wt 220 lb (99.791 kg)  BMI 35.51 kg/m2  SpO2 100%  Physical Exam  Nursing note and vitals reviewed. Constitutional: She is oriented to person, place, and time. She appears well-developed and well-nourished.  Non-toxic appearance.  HENT:  Head: Normocephalic.  Right Ear: Tympanic membrane and external ear normal.  Left Ear: Tympanic membrane and external ear normal.  Eyes: EOM and lids are normal. Pupils are equal, round, and reactive to light.  Neck: Normal range of motion. Neck supple. Carotid bruit is not present.  Cardiovascular: Normal rate, regular rhythm, normal heart sounds, intact distal pulses and normal pulses.   Pulmonary/Chest: Breath  sounds normal. No respiratory distress.  Abdominal: Soft. Bowel sounds are normal. There is no tenderness. There is no guarding.  Musculoskeletal: Normal range of motion.  Lymphadenopathy:       Head (right side): No submandibular adenopathy present.       Head (left side): No submandibular adenopathy present.    She has no cervical adenopathy.  Neurological: She is alert and oriented to person, place, and time. She has normal strength. No cranial nerve deficit or  sensory deficit. GCS eye subscore is 4. GCS verbal subscore is 5. GCS motor subscore is 6.       Grips symmetrical. Speech understandable. No facial droop.  Skin: Skin is warm and dry.  Psychiatric: She has a normal mood and affect. Her speech is normal.    ED Course  Procedures  I have reviewed nursing notes, vital signs, and all appropriate lab and imaging results for this patient.   Medical screening examination/treatment/procedure(s) were performed by non-physician practitioner and as supervising physician I was immediately available for consultation/collaboration.    Kathie Dike, [place] 06/24/11 1157  Benny Lennert, MD 07/28/11 0900

## 2011-06-24 NOTE — Progress Notes (Signed)
Pt's pain is now 5/10, down from 8/10. O2 sat fell 90 after IV dilaudid. O2 by [place] given and improved.

## 2011-06-24 NOTE — ED Notes (Signed)
Pt currently sleeping along with family member.

## 2011-06-25 ENCOUNTER — Ambulatory Visit (HOSPITAL_COMMUNITY): Payer: Medicaid Other

## 2011-06-29 NOTE — Progress Notes (Signed)
Pt is aware of OV for 9/19 @ 0945 with SF

## 2011-06-30 NOTE — Op Note (Signed)
  Penny [name], Penny [name]               ACCOUNT NO.:  [id number]  MEDICAL RECORD NO.:  [id number]  LOCATION:  DAYP                          FACILITY:  [facility]  PHYSICIAN:  Jonette Eva, M.D.     DATE OF BIRTH:  05/08/1986  DATE OF PROCEDURE:  06/03/2011 DATE OF DISCHARGE:                              OPERATIVE REPORT   REFERRING PROVIDER:  Lorin Picket A. Luking, MD  PROCEDURE:  Ileocolonoscopy with random cold forceps biopsies.  INDICATION FOR EXAM:  [name] is a 25 year old female who presents with abdominal pain and diarrhea.  She also states she has rectal bleeding.  FINDINGS: 1. Extremely tortuous, (redundant transverse and sigmoid colon). 2. Normal terminal ileum approximately 10-15 cm visualized. 3. No polyps, masses, inflammatory changes, diverticular AVMs seen.     Random biopsies obtained via cold forceps to evaluate for     microscopic colitis as an etiology for diarrhea. 4. Unable to appreciate internal hemorrhoids.  Normal retroflexed view     of the rectum.  DIAGNOSES: 1. Rectal bleeding most likely secondary to benign anorectal source. 2. No obvious source of diarrhea identified.  RECOMMENDATIONS: 1. We will call with results of her biopsies. 2. We would recommend a high-fiber diet.  However, the patient has a 5-     cm gastric remnant.  She should follow postgastrectomy diet.  We     will add FiberCon 2 pills twice a day. 3. No aspirin, NSAIDs, or anticoagulation for 3 days. 4. Draw tissue transglutaminase IgA today. 5. She already has a followup appointment to see me in 3 months. 6. She should follow up with mental health. 7. NEXT ENDOSCOPY WITH OVERTIBE, 1 HOUR TIME SLOT, AND PROPOFOL.  MEDICATIONS:  Propofol provided by Anesthesia.  PROCEDURE TECHNIQUE:  Physical exam was formed.  Informed consent was obtained from the patient after explaining benefits, risks, and alternatives to the procedure.  The patient was connected to the monitor and placed in left  lateral position.  Continuous oxygen was provided by nasal cannula, IV medicine administered through an indwelling cannula. After administration of sedation and rectal exam, the patient's rectum was intubated and scope was advanced under direct visualization to the distal terminal ileum.  She required multiple changes in position to achieve successful intubation of the CECUM.  She was on her stomach.  The scope were removed slowly by carefully examining the color, texture, anatomy, and integrity of the mucosa on the way out.  The patient was covered in endoscopy and discharged home in satisfactory condition.  PATH: NL COLON     Jonette Eva, M.D.     SF/MEDQ  D:  06/03/2011  T:  06/04/2011  Job:  [id number]  Electronically Signed by Jonette Eva M.D. on 06/30/2011 12:40:54 PM

## 2011-07-15 ENCOUNTER — Emergency Department (HOSPITAL_COMMUNITY)
Admission: EM | Admit: 2011-07-15 | Discharge: 2011-07-15 | Disposition: A | Discharge status: Home / Self Care | Payer: Medicaid Other | Attending: Emergency Medicine | Admitting: Emergency Medicine

## 2011-07-15 ENCOUNTER — Encounter (HOSPITAL_COMMUNITY): Payer: Self-pay | Admitting: *Deleted

## 2011-07-15 DIAGNOSIS — L299 Pruritus, unspecified: Secondary | ICD-10-CM | POA: Insufficient documentation

## 2011-07-15 DIAGNOSIS — F17200 Nicotine dependence, unspecified, uncomplicated: Secondary | ICD-10-CM | POA: Insufficient documentation

## 2011-07-15 DIAGNOSIS — Z9884 Bariatric surgery status: Secondary | ICD-10-CM | POA: Insufficient documentation

## 2011-07-15 DIAGNOSIS — G43909 Migraine, unspecified, not intractable, without status migrainosus: Secondary | ICD-10-CM | POA: Insufficient documentation

## 2011-07-15 DIAGNOSIS — I10 Essential (primary) hypertension: Secondary | ICD-10-CM | POA: Insufficient documentation

## 2011-07-15 DIAGNOSIS — T7840XA Allergy, unspecified, initial encounter: Secondary | ICD-10-CM

## 2011-07-15 DIAGNOSIS — Z79899 Other long term (current) drug therapy: Secondary | ICD-10-CM | POA: Insufficient documentation

## 2011-07-15 DIAGNOSIS — Z862 Personal history of diseases of the blood and blood-forming organs and certain disorders involving the immune mechanism: Secondary | ICD-10-CM | POA: Insufficient documentation

## 2011-07-15 MED ORDER — FAMOTIDINE 20 MG PO TABS
20.0000 mg | ORAL_TABLET | Freq: Once | ORAL | Status: AC
  Administered 2011-07-15: 20 mg via ORAL
  Filled 2011-07-15: qty 1

## 2011-07-15 MED ORDER — LORATADINE 10 MG PO TABS
10.0000 mg | ORAL_TABLET | Freq: Once | ORAL | Status: AC
  Administered 2011-07-15: 10 mg via ORAL
  Filled 2011-07-15: qty 1

## 2011-07-15 NOTE — ED Notes (Signed)
Pt states she took 100mg  of Benadryl PO pta; pt states she has and issue with absorbing medications PO since her gastric bypass surgery

## 2011-07-15 NOTE — ED Notes (Signed)
Pt c/o not being able to swallow and feeling of burning to hands; pt states she has been taking prednisone and thinks she is having an allergic reaction; pt has been taking since Tuesday of this week

## 2011-07-15 NOTE — ED Notes (Signed)
Pt self ambulated out with a steady gait stating no needs 

## 2011-07-15 NOTE — ED Provider Notes (Signed)
History     CSN: [id number] Arrival date & time: 07/15/2011  8:38 PM  Chief Complaint  Patient presents with  . Allergic Reaction   HPI Comments: Seen 2215. Patient seeing a neurologist for migraines. Started on Depakote 2 weeks ago as preventative and prednisone to break the current cycle of headaches. She has noticed that it was difficult to swallow at all three meals today. She has had a redness and itching to her face, hands and the bottoms of her feet. She took 100 mg of benadryl an hour before coming to the ER. She is feeling a little better now than when she first came in.  Patient is a 25 y.o. female presenting with allergic reaction. The history is provided by the patient.  Allergic Reaction The primary symptoms do not include wheezing, shortness of breath, cough, nausea or vomiting. Primary symptoms comment: itching and redness to face, itching to hands and feet The current episode started 3 to 5 hours ago. The problem has not changed since onset. The onset of the reaction was associated with a change in medication (possibly change in medicine or addition of new medicine). Significant symptoms also include itching.    Past Medical History  Diagnosis Date  . Migraines   . Interstitial cystitis   . IUD FEB 2010  . Gastritis JULY 2011  . Depression   . 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 Fatty liver   . Obesity (BMI 30-39.9) 2011 228 LBS BMI 36.8  . Iron deficiency anemia 07/23/2010    Past Surgical History  Procedure Date  . Gab 2007    in [place]-POUCH 5 CM  . Cholecystectomy 2005    biliary dyskinesia  . Upper gastrointestinal endoscopy JULY 2011 NAUSEA-D125,V6, PH 25    Bx; GASTRITIS, POUCH-5 CM LONG  . Colonoscopy   . Cath self every nite     for sodium bicarb injection    Family History  Problem Relation Age of Onset  . Hemochromatosis Maternal Grandmother     History  Substance Use Topics  . Smoking status: Current Everyday Smoker -- 0.5 packs/day      Types: Cigarettes  . Smokeless tobacco: Not on file  . Alcohol Use: No    OB History    Grav Para Term Preterm Abortions TAB SAB Ect Mult Living   1 1 1       1       Review of Systems  Respiratory: Negative for cough, shortness of breath and wheezing.   Gastrointestinal: Negative for nausea and vomiting.  Skin: Positive for itching.  All other systems reviewed and are negative.    Physical Exam  BP 119/69  Pulse 91  Temp(Src) 98.5 F (36.9 C) (Oral)  Resp 18  Ht 5\' 6"  (1.676 m)  Wt 220 lb (99.791 kg)  BMI 35.51 kg/m2  SpO2 97%  Physical Exam  Constitutional: She is oriented to person, place, and time. She appears well-developed and well-nourished. No distress.  HENT:  Head: Normocephalic.  Right Ear: External ear normal.  Mouth/Throat: Oropharynx is clear and moist.  Eyes: EOM are normal. Pupils are equal, round, and reactive to light.  Neck: Normal range of motion. Neck supple.  Cardiovascular: Normal rate, normal heart sounds and intact distal pulses.   Pulmonary/Chest: Effort normal and breath sounds normal.  Abdominal: Soft. Bowel sounds are normal.  Musculoskeletal: Normal range of motion.  Neurological: She is alert and oriented to person, place, and time.  Skin: Skin is warm and dry.       No urticaria, rash, erythema, lesions, swelling noted.    ED Course  Procedures  MDM Reviewed nurse notes, vital signs.       Nicoletta Dress. Colon Branch, MD 07/15/11 2239

## 2011-07-23 ENCOUNTER — Other Ambulatory Visit (HOSPITAL_COMMUNITY): Payer: Medicaid Other

## 2011-07-25 ENCOUNTER — Encounter (HOSPITAL_COMMUNITY): Payer: Self-pay | Admitting: *Deleted

## 2011-07-25 ENCOUNTER — Emergency Department (HOSPITAL_COMMUNITY)
Admission: EM | Admit: 2011-07-25 | Discharge: 2011-07-25 | Disposition: A | Discharge status: Home / Self Care | Payer: Medicaid Other | Attending: Emergency Medicine | Admitting: Emergency Medicine

## 2011-07-25 DIAGNOSIS — G43909 Migraine, unspecified, not intractable, without status migrainosus: Secondary | ICD-10-CM | POA: Insufficient documentation

## 2011-07-25 MED ORDER — PROMETHAZINE HCL 25 MG/ML IJ SOLN
25.0000 mg | Freq: Once | INTRAMUSCULAR | Status: AC
  Administered 2011-07-25: 25 mg via INTRAVENOUS
  Filled 2011-07-25: qty 1

## 2011-07-25 MED ORDER — SODIUM CHLORIDE 0.9 % IV BOLUS (SEPSIS)
1000.0000 mL | Freq: Once | INTRAVENOUS | Status: AC
  Administered 2011-07-25: 1000 mL via INTRAVENOUS

## 2011-07-25 MED ORDER — DIPHENHYDRAMINE HCL 50 MG/ML IJ SOLN
25.0000 mg | Freq: Once | INTRAMUSCULAR | Status: AC
Start: 2011-07-25 — End: 2011-07-25
  Administered 2011-07-25: 25 mg via INTRAVENOUS
  Filled 2011-07-25: qty 1

## 2011-07-25 MED ORDER — LORAZEPAM 2 MG/ML IJ SOLN
1.0000 mg | Freq: Once | INTRAMUSCULAR | Status: AC
  Administered 2011-07-25: 1 mg via INTRAVENOUS
  Filled 2011-07-25: qty 1

## 2011-07-25 MED ORDER — HYDROMORPHONE HCL 1 MG/ML IJ SOLN
1.0000 mg | Freq: Once | INTRAMUSCULAR | Status: AC
  Administered 2011-07-25: 1 mg via INTRAVENOUS
  Filled 2011-07-25: qty 1

## 2011-07-25 MED ORDER — KETOROLAC TROMETHAMINE 30 MG/ML IJ SOLN
30.0000 mg | Freq: Once | INTRAMUSCULAR | Status: AC
  Administered 2011-07-25: 30 mg via INTRAVENOUS
  Filled 2011-07-25: qty 1

## 2011-07-25 NOTE — ED Notes (Signed)
Patient was assisted to the bathroom and back. Tolerated well. Does not need anything else at this time.

## 2011-07-25 NOTE — ED Notes (Signed)
Pt c/o migraine headache since Friday. Pt states she has had a headache for 2 weeks but has gotten worse. Pt c/o pain increase with light, sound and movement.

## 2011-07-25 NOTE — ED Provider Notes (Signed)
History   Scribed for [name], MD, the patient was seen in room [facility]. This chart was scribed by Clarita Crane. This patient's care was started at 10:50AM.   CSN: [id number] Arrival date & time: 07/25/2011 10:23 AM  Chief Complaint  Patient presents with  . Migraine   Patient is a 25 y.o. female presenting with migraine. The history is provided by the patient.  Migraine   Penny [name] is a 25 y.o. female who presents to the Emergency Department complaining of headache to the left-side forehead with the onset occuring 2 days ago with associated symptoms of fatigue. Patient reports having an intermittent HA similar to previous migraines for the past 2 weeks but was prescribed steriods by PCP and experienced several days of relief before HA returned 2 days ago. Denies associated diplopia with HA. Patient also currently taking Tylenol, Ibuprofen, and Depeco. Pt states she had allergic reaction to steroids, so stopped these.  Also did not try imitrex as she did not have any availabe.  No fever, no vomiting, no changes in vision.    HPI ELEMENTS: Location: Left-side forehead  Onset: 2 days ago Duration: Persistent since episode Timing: Constant Modifying factors: Steroids helped relieve the symptoms temporarily.  Context: as above  Associated symptoms: Throat pain and lethargy. Denies diplopia    PAST MEDICAL HISTORY:  Past Medical History  Diagnosis Date  . Migraines   . Interstitial cystitis   . IUD FEB 2010  . Gastritis JULY 2011  . Depression   . 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 Fatty liver   . Obesity (BMI 30-39.9) 2011 228 LBS BMI 36.8  . Iron deficiency anemia 07/23/2010    PAST SURGICAL HISTORY:  Past Surgical History  Procedure Date  . Gab 2007    in [place]-POUCH 5 CM  . Cholecystectomy 2005    biliary dyskinesia  . Upper gastrointestinal endoscopy JULY 2011 NAUSEA-D125,V6, PH 25    Bx; GASTRITIS, POUCH-5 CM LONG  . Colonoscopy   . Cath  self every nite     for sodium bicarb injection    MEDICATIONS:  Previous Medications   CLONAZEPAM (KLONOPIN) 0.5 MG TABLET    Take 0.5 mg by mouth 2 (two) times daily as needed. For anxiety   DICYCLOMINE (BENTYL) 10 MG CAPSULE    Take 10 mg by mouth 4 (four) times daily -  before meals and at bedtime.     DIETHYLPROPION HCL 25 MG TABS    Take 1 tablet by mouth daily.    DIPHENHYDRAMINE (BENADRYL) 25 MG CAPSULE    Take 100 mg by mouth once as needed. For allergies    DIVALPROEX (DEPAKOTE) 250 MG EC TABLET    Take 250 mg by mouth 2 (two) times daily. For migraines    DULOXETINE (CYMBALTA) 60 MG CAPSULE    Take 60 mg by mouth at bedtime.    HYDROCODONE-ACETAMINOPHEN (NORCO) 10-325 MG PER TABLET    Take 1 tablet by mouth every 6 (six) hours as needed. For pain   LIDOCAINE (XYLOCAINE) 2 % SOLUTION    Take 20 mLs by mouth as needed.    LORATADINE (CLARITIN) 10 MG TABLET    Take 10 mg by mouth daily.     MILNACIPRAN HCL (SAVELLA PO)    Take by mouth.     ONDANSETRON HCL (ZOFRAN PO)    Take 8 mg by mouth daily as needed. For nausea   PENTOSAN POLYSULFATE (ELMIRON) 100  MG CAPSULE    Take 100 mg by mouth 3 (three) times daily before meals.     PHENAZOPYRIDINE (PYRIDIUM) 200 MG TABLET    Take 200 mg by mouth 3 (three) times daily as needed. bladder   PROMETHAZINE (PHENERGAN) 25 MG TABLET    Take 25 mg by mouth every 6 (six) hours as needed.     SODIUM BICARBONATE (NEUT) 4 % INJECTION    Inject 5 mLs into the vein at bedtime. Sod. Bicarb. 5cc + Lidocaine 10cc +heparin 4cc injected thru urinary catheter for intersistal cystitis      SUMATRIPTAN (IMITREX) 100 MG TABLET    Take 100 mg by mouth every 2 (two) hours as needed.     TIZANIDINE (ZANAFLEX) 4 MG CAPSULE    Take 4 mg by mouth 3 (three) times daily.     TRIMETHOPRIM PO    Take by mouth.       ALLERGIES:  Allergies as of 07/25/2011 - Review Complete 07/25/2011  Allergen Reaction Noted  . Codeine    . Gabapentin    . Latex    . Reglan   05/27/2011     FAMILY HISTORY:  Family History  Problem Relation Age of Onset  . Hemochromatosis Maternal Grandmother      SOCIAL HISTORY: History   Social History  . Marital Status: Legally Separated    Spouse Name: N/A    Number of Children: N/A  . Years of Education: N/A   Social History Main Topics  . Smoking status: Current Everyday Smoker -- 0.5 packs/day    Types: Cigarettes  . Smokeless tobacco: None  . Alcohol Use: No  . Drug Use: No  . Sexually Active: None   Other Topics Concern  . None   Social History Narrative  . None     Review of Systems  10 Systems reviewed and are negative for acute change except as noted in the HPI.  Physical Exam  BP 102/55  Pulse 74  Temp(Src) 98.4 F (36.9 C) (Oral)  Resp 18  Ht 5\' 6"  (1.676 m)  Wt 220 lb (99.791 kg)  BMI 35.51 kg/m2  SpO2 94%  Physical Exam  Nursing note and vitals reviewed. Constitutional: She is oriented to person, place, and time. She appears well-developed and well-nourished.  HENT:  Head: Normocephalic and atraumatic.  Eyes: EOM are normal. Pupils are equal, round, and reactive to light.  Neck: Neck supple.  Cardiovascular: Normal rate, regular rhythm and normal heart sounds.  Exam reveals no gallop and no friction rub.   No murmur heard. Pulmonary/Chest: Effort normal and breath sounds normal. She has no wheezes.  Abdominal: Soft. Bowel sounds are normal. She exhibits no distension. There is no tenderness.  Musculoskeletal: Normal range of motion. She exhibits no edema.  Neurological: She is alert and oriented to person, place, and time. No cranial nerve deficit or sensory deficit.  Skin: Skin is warm and dry.  Psychiatric: She has a normal mood and affect. Her behavior is normal.    ED Course  Procedures  MDM OTHER DATA REVIEWED: Nursing notes, vital signs, and past medical records reviewed. Lab results reviewed and considered Imaging results reviewed and considered  DIAGNOSTIC  STUDIES: Oxygen Saturation is 98% on room air, normal by my interpretation.    ED COURSE / COORDINATION OF CARE: 2:21 PM Pt not feeling better after initial phenergan, benadryl, toradol Will give dilaudid, ativan and 2nd fluid bolus and reassess   MDM: I have reviewed the patient's  medical history in detail including prior ED visits and treatments  Pt feeling much more comfortable after second dose of meds.  STates headache is resolved.   Discharged with strict return precautions.  Pt agreeable with plan.   PLAN: discharged to f/u with PMD, given strict return precautions.  The patient is to return the emergency department if there is any worsening of symptoms. I have reviewed the discharge instructions with the patient/family  CONDITION ON DISCHARGE:stable   MEDICATIONS GIVEN IN THE E.D.  Medications  sodium chloride 0.9 % bolus 1,000 mL (1000 mL Intravenous Given 07/25/11 1453)  sodium chloride 0.9 % bolus 1,000 mL (1000 mL Intravenous Given 07/25/11 1147)  ketorolac (TORADOL) injection 30 mg (30 mg Intravenous Given 07/25/11 1147)  promethazine (PHENERGAN) injection 25 mg (25 mg Intravenous Given 07/25/11 1146)  diphenhydrAMINE (BENADRYL) injection 25 mg (25 mg Intravenous Given 07/25/11 1147)  LORazepam (ATIVAN) injection 1 mg (1 mg Intravenous Given 07/25/11 1452)  HYDROmorphone (DILAUDID) injection 1 mg (1 mg Intravenous Given 07/25/11 1453)     I personally performed the services described in this documentation, which was scribed in my presence. The recorded information has been reviewed and considered. [name], MD         [name], MD 07/25/11 [phone]

## 2011-07-25 NOTE — ED Notes (Signed)
Pt a/ox4. Resp even and unlabored. NAD at this time. D/C instructions reviewed with pt. Pt and husband verbalized understanding. Pt to be driven home by husband. Pt ambulated to lobby with steady gate.

## 2011-07-25 NOTE — ED Notes (Signed)
Pt stable at this time. 2nd L of NS hung and pt medicated for pain. Pt lying in stretcher. Pt states she is comfortable and does not need anything else. Husband at pt bedside.

## 2011-08-03 ENCOUNTER — Inpatient Hospital Stay (HOSPITAL_COMMUNITY)
Admission: AD | Admit: 2011-08-03 | Discharge: 2011-08-06 | DRG: 103 | Disposition: A | Discharge status: Home / Self Care | Payer: Medicaid Other | Source: Ambulatory Visit | Attending: Neurology | Admitting: Neurology

## 2011-08-03 ENCOUNTER — Inpatient Hospital Stay (HOSPITAL_COMMUNITY): Payer: Medicaid Other

## 2011-08-03 DIAGNOSIS — F988 Other specified behavioral and emotional disorders with onset usually occurring in childhood and adolescence: Secondary | ICD-10-CM | POA: Diagnosis present

## 2011-08-03 DIAGNOSIS — D649 Anemia, unspecified: Secondary | ICD-10-CM | POA: Diagnosis present

## 2011-08-03 DIAGNOSIS — F411 Generalized anxiety disorder: Secondary | ICD-10-CM | POA: Diagnosis present

## 2011-08-03 DIAGNOSIS — Z87891 Personal history of nicotine dependence: Secondary | ICD-10-CM

## 2011-08-03 DIAGNOSIS — F329 Major depressive disorder, single episode, unspecified: Secondary | ICD-10-CM | POA: Diagnosis present

## 2011-08-03 DIAGNOSIS — E669 Obesity, unspecified: Secondary | ICD-10-CM | POA: Diagnosis present

## 2011-08-03 DIAGNOSIS — G43811 Other migraine, intractable, with status migrainosus: Principal | ICD-10-CM | POA: Diagnosis present

## 2011-08-03 DIAGNOSIS — F3289 Other specified depressive episodes: Secondary | ICD-10-CM | POA: Diagnosis present

## 2011-08-03 LAB — DIFFERENTIAL
Basophils Absolute: 0 10*3/uL (ref 0.0–0.1)
Basophils Relative: 0 % (ref 0–1)
Eosinophils Absolute: 0.1 10*3/uL (ref 0.0–0.7)
Eosinophils Relative: 1 % (ref 0–5)
Monocytes Absolute: 0.4 10*3/uL (ref 0.1–1.0)
Monocytes Relative: 7 % (ref 3–12)

## 2011-08-03 LAB — CBC
Hemoglobin: 11.4 g/dL — ABNORMAL LOW (ref 12.0–15.0)
MCH: 29.2 pg (ref 26.0–34.0)
MCHC: 33.6 g/dL (ref 30.0–36.0)
RDW: 19.2 % — ABNORMAL HIGH (ref 11.5–15.5)

## 2011-08-03 LAB — BASIC METABOLIC PANEL
Calcium: 9 mg/dL (ref 8.4–10.5)
GFR calc Af Amer: >60 mL/min (ref >60)
GFR calc non Af Amer: >60 mL/min (ref >60)
Glucose, Bld: 83 mg/dL (ref 70–99)
Potassium: 3.8 mEq/L (ref 3.5–5.1)
Sodium: 140 mEq/L (ref 135–145)

## 2011-08-04 LAB — URINALYSIS, ROUTINE W REFLEX MICROSCOPIC
Bilirubin Urine: NEGATIVE
Hgb urine dipstick: NEGATIVE
Nitrite: NEGATIVE
Protein, ur: NEGATIVE mg/dL
Urobilinogen, UA: 1 mg/dL (ref 0.0–1.0)

## 2011-08-04 LAB — URINE MICROSCOPIC-ADD ON

## 2011-08-04 NOTE — H&P (Signed)
Penny [name], Penny [name]               ACCOUNT NO.:  [id number]  MEDICAL RECORD NO.:  [id number]  LOCATION:  3704                         FACILITY:  [facility]  PHYSICIAN:  Vineta Carone P. Pearlean Brownie, MD    DATE OF BIRTH:  02/24/1986  DATE OF ADMISSION:  08/03/2011 DATE OF DISCHARGE:                             HISTORY & PHYSICAL   REASON FOR ADMISSION:  Refractory migraine headaches status migrainosus.  CLINICAL HISTORY:  [name] is a 25 year old Caucasian lady who has a longstanding history of migraine headaches from the last 10 years.  The headache seemed to have increased in frequency and severity for the last couple of years.  She has been having unrelenting headache almost daily for the last 3-4 weeks.  She was last seen in the office by Dr. Anne Hahn on July 27, 2011 for refractory headaches and had some medication changes in which Depakote, Cymbalta, and Zanaflex were discontinued. Nortriptyline was added and she was set up for Botox injections but the approval has not yet come back.  She has been to the ER several times in the last couple of weeks to [facility] where she has been treated with short-term injections of narcotics which provided only partial relief.  She states she woke up today with a terrible headache, 10/10 in severity with some nausea, light and sounds sensitivity, as well as blurred vision.  She called office today earlier and was advised to come and she was given 1.5 grams of IV Depacon which led to some improvement in her photosensitivity and blurred vision but headache persisted.  She was subsequently given Toradol 30 mg IV and 12.5 mg of Phenergan IV, but she had no relief with that and hence I advised the patient to come to the hospital for admission for consideration for DHE protocol.  She had had 2 tablets of Imitrex earlier today as well.  She also takes some Tylenol with Codeine which has not helped.  In the past, she has failed Depakote, Cymbalta,  Zanaflex, Topamax, and Neurontin. She was treated with oral Medrol Dosepak treatment 2-3 weeks ago but did not take it more than 2-3 days as she did not tolerate the medicine due to side effects.  She denies any focal neurological symptoms in the form of loss of vision, slurred speech, extremity weakness, numbness, gait or balance problems.  She denies any active signs of infection, fever, or any obvious triggers.  PAST MEDICAL HISTORY:  Significant only for: 1. Migraine headaches. 2. Mild obesity. 3. Interstitial cystitis. 4. Chronic anemia. 5. Depression. 6. Anxiety. 7. Attention deficit disorder.  PAST SURGICAL HISTORY: 1. Gastric bypass. 2. Gallbladder resection. 3. D and C. 4. Tonsillectomy.  FAMILY HISTORY:  Positive for brother with occasional headaches, possibly migraines and hemochromatosis, multiple sclerosis.  SOCIAL HISTORY:  The patient is single, lives with her son.  She is currently unemployed.  She has a college degree.  She quit smoking last year.  She drinks alcohol only occasionally.  MEDICATION ALLERGIES: 1. GABAPENTIN. 2. REGLAN. 3. LATEX. 4. CODEINE.  HOME MEDICATIONS:  Prior to admission: 1. Tylenol with hydrocodone as needed. 2. Pamelor 10 mg at night. 3. Dicyclomine 10 mg daily. 4. Loratadine  10 mg daily. 5. Zofran ODT 6 hourly as needed. 6. Klonopin 0.5 mg 3 times daily as needed. 7. Promethazine 25 mg 2 tablets at bedtime.  REVIEW OF SYSTEMS:  As documented above in history of present illness.  PHYSICAL EXAMINATION:  GENERAL:  Pleasant, mildly obese young Caucasian lady currently appears not to be in any distress. VITAL SIGNS:  She is afebrile, pulse rate is 78 per minute and regular, respiratory rate 16 per minute.  Distal pulses well felt. HEAD:  Nontraumatic. NECK:  Supple.  There is bruit. ENT:  Hearing appears normal. CARDIAC:  No murmur or gallop. LUNGS:  Clear to auscultation. ABDOMEN:  Soft and nontender. NEUROLOGIC:  She is  pleasant, awake, alert, and cooperative.  There is no aphasia, apraxia, or dysarthria.  Eye movements are full range without nystagmus.  Visual acuity and fields seemed accurate.  Face is symmetric.  Palatal movements are normal.  Tongue is midline.  Motor system exam reveals no upper or lower extremity drift.  Symmetric and equal strength in all 4 extremities with no focal weakness.  Sensation and coordination seemed intact.  Gait was not tested.  DATA REVIEWED:  Dr. Anne Hahn' office note from July 27, 2011 was reviewed.  IMPRESSION:  This is a 25 year old lady with refractory migraine headaches.  PLAN:  We will admit her for DHE protocol.  We will start IV fluids, give her a dose of Solu-Medrol 1 gram now.  We will start DHE 0.5 mg every 6 hourly along with Phenergan 12.5 mg q.6 hourly for at least 6-8 doses until she is headache free.  We will continue Pamelor 10 mg at night.  Use Dilaudid 1 mg every 2-4 hourly as needed for rescue.  Check noncontrast CAT scan of the head to rule out any structural lesions as well as check metabolic panel labs, CBC, and UA.  I discussed the case with Dr. Anne Hahn.  We will follow the patient in the morning.     [name] P. Pearlean Brownie, MD     PPS/MEDQ  D:  08/03/2011  T:  08/04/2011  Job:  [id number]  Electronically Signed by Delia Heady MD on 08/04/2011 09:37:26 PM

## 2011-08-05 ENCOUNTER — Inpatient Hospital Stay (HOSPITAL_COMMUNITY): Payer: Medicaid Other

## 2011-08-05 LAB — URINE CULTURE
Culture  Setup Time: 201208291429
Culture: NO GROWTH

## 2011-08-25 ENCOUNTER — Encounter: Payer: Self-pay | Admitting: Gastroenterology

## 2011-08-25 ENCOUNTER — Ambulatory Visit (INDEPENDENT_AMBULATORY_CARE_PROVIDER_SITE_OTHER): Payer: Medicaid Other | Admitting: Gastroenterology

## 2011-08-25 DIAGNOSIS — Z8639 Personal history of other endocrine, nutritional and metabolic disease: Secondary | ICD-10-CM

## 2011-08-25 DIAGNOSIS — K589 Irritable bowel syndrome without diarrhea: Secondary | ICD-10-CM

## 2011-08-25 DIAGNOSIS — Z862 Personal history of diseases of the blood and blood-forming organs and certain disorders involving the immune mechanism: Secondary | ICD-10-CM

## 2011-08-25 DIAGNOSIS — D509 Iron deficiency anemia, unspecified: Secondary | ICD-10-CM

## 2011-08-25 NOTE — Progress Notes (Signed)
Subjective:    Patient ID: Penny [name], female    DOB: 10/24/1986, 25 y.o.   MRN: [id number]  PCP: Gerda Diss  HPI Seeing chiropractor for migraines & it helps. Off some meds and feels better. Ttrying to lose weight with Body-by-Vi. No pain in abd. JUL 2012 FERRITIN 4, TIBC 447. Saw Dr. Kellie Simmering iron infusion. See him in NOV again. No MH visit. Feels like MH better-stress decreased. BM: QD-QOD  Past Medical History  Diagnosis Date  . Migraines   . Interstitial cystitis   . IUD FEB 2010  . Gastritis JULY 2011  . Depression   . 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 Fatty liver   . Obesity (BMI 30-39.9) 2011 228 LBS BMI 36.8  . Iron deficiency anemia 07/23/2010  . Irritable bowel syndrome 2012 DIARRHEA    JUN 2012 TTG IgA 14.9  . Elevated liver enzymes JUL 2011ALK PHOS 111-127 AST  143-267 ALT  213-321T BILI 0.6  ALB  3.7-4.06 Jun 2011 ALK PHOS 118 AST 24 ALT 42 T BILI 0.4 ALB 3.9    Past Surgical History  Procedure Date  . Gab 2007    in [place]-POUCH 5 CM  . Cholecystectomy 2005    biliary dyskinesia  . Cath self every nite     for sodium bicarb injection  . Colonoscopy JUN 2012 ABD PN/DIARRHEA WITH PROPOFOL    NL COLON  . Upper gastrointestinal endoscopy JULY 2011 NAUSEA-D125,V6, PH 25    Bx; GASTRITIS, POUCH-5 CM LONG    Allergies  Allergen Reactions  . Codeine     VOMTIING  . Gabapentin     UNRESPONSIVE BUT VITALS STABLE  . Latex     RASH  . Reglan     FEELS LIKE SHE'S IN A BOX AND CAN'T GET OUT    Current Outpatient Prescriptions  Medication Sig Dispense Refill  . clonazePAM (KLONOPIN) 0.5 MG tablet Take 0.5 mg by mouth 2 (two) times daily as needed. For anxiety      . dexlansoprazole (DEXILANT) 60 MG capsule Take 60 mg by mouth daily.        Marland Kitchen dicyclomine (BENTYL) 10 MG capsule Take 10 mg by mouth 4 (four) times daily -  before meals and at bedtime.        Marland Kitchen HYDROcodone-acetaminophen (NORCO) 10-325 MG per tablet Take 1 tablet by mouth every 6  (six) hours as needed. For pain      . loratadine (CLARITIN) 10 MG tablet Take 10 mg by mouth daily.        . nortriptyline (PAMELOR) 10 MG capsule Take 10 mg by mouth at bedtime.        . phenazopyridine (PYRIDIUM) 200 MG tablet Take 200 mg by mouth 3 (three) times daily as needed. bladder      . QUEtiapine (SEROQUEL) 50 MG tablet Take 50 mg by mouth at bedtime.        . sodium bicarbonate (NEUT) 4 % injection Inject 5 mLs into the vein at bedtime. Sod. Bicarb. 5cc + Lidocaine 10cc +heparin 4cc injected thru urinary catheter for intersistal cystitis         . SUMAtriptan (IMITREX) 100 MG tablet Take 100 mg by mouth every 2 (two) hours as needed.                  Review of Systems     Objective:   Physical Exam  Constitutional: She is oriented to  person, place, and time. She appears well-developed and well-nourished. No distress.  HENT:  Head: Normocephalic and atraumatic.  Cardiovascular: Normal rate and regular rhythm.   Pulmonary/Chest: Effort normal and breath sounds normal.  Abdominal: Soft. Bowel sounds are normal. She exhibits no distension. There is no tenderness.  Neurological: She is alert and oriented to person, place, and time.  Psychiatric: She has a normal mood and affect.          Assessment & Plan:

## 2011-08-25 NOTE — Progress Notes (Signed)
Reminder in epic to follow up in 6 months °

## 2011-08-25 NOTE — Assessment & Plan Note (Addendum)
Mild elevation, not clinically significant-?2o TO FATTY LIVER DISEASE, LESS LIKELY DRUG SIDE EFFECT.  Repeat in 6 mos. Encouraged weight loss. Body-by-Vi ingredients reviewed. Contains Soy protein, vitamin, minerals, and fiber.

## 2011-08-25 NOTE — Assessment & Plan Note (Signed)
Sx improved W/ DIET MODIFICATION AND STRESS RELAXATION.  OPV IN 6 MOS.

## 2011-08-25 NOTE — Progress Notes (Signed)
Cc to PCP 

## 2011-08-25 NOTE — Assessment & Plan Note (Signed)
Following with [name] for IVFE.

## 2011-08-26 LAB — POCT HEMOGLOBIN-HEMACUE
Hemoglobin: 11.5 — ABNORMAL LOW
Operator id: 118191

## 2011-08-26 LAB — POCT PREGNANCY, URINE: Preg Test, Ur: NEGATIVE

## 2011-09-07 LAB — URINALYSIS, ROUTINE W REFLEX MICROSCOPIC
Glucose, UA: NEGATIVE
Hgb urine dipstick: NEGATIVE
Ketones, ur: NEGATIVE
Protein, ur: NEGATIVE
Urobilinogen, UA: 0.2

## 2011-09-07 LAB — URINE MICROSCOPIC-ADD ON

## 2011-09-14 NOTE — Discharge Summary (Signed)
Penny [name], Penny [name]               ACCOUNT NO.:  [id number]  MEDICAL RECORD NO.:  [id number]  LOCATION:  3704                         FACILITY:  [facility]  PHYSICIAN:  Marlan Palau, M.D.  DATE OF BIRTH:  1986-06-30  DATE OF ADMISSION:  08/03/2011 DATE OF DISCHARGE:  08/06/2011                              DISCHARGE SUMMARY   ADMISSION DIAGNOSIS:  Status migrainosus.  DISCHARGE DIAGNOSES: 1. Status migrainosus. 2. History of depression. 3. Anxiety disorder.  PROCEDURES DURING THIS ADMISSION:  CT scan of the head.  COMPLICATIONS OF THE ABOVE PROCEDURE:  None.  HISTORY OF PRESENT ILLNESS:  [name] is a 24 year old white female with a history of migraine headaches dating back to 10 years.  The patient has had increased frequency and severity of headaches over the last several years.  The patient has had a severe headache for almost 3- 4 weeks that has been daily in nature.  The patient's headaches are refractory to medications including Depakote, Cymbalta, and Zanaflex. Nortriptyline was added, but has not helped and Botox injections are pending.  The patient has been to emergency room several times without benefit.  The patient is brought in for a DHE-45 protocol.  PAST MEDICAL HISTORY:  Significant for, 1. History of chronic daily headache. 2. Obesity. 3. Interstitial cystitis. 4. Chronic anemia. 5. Depression. 6. Anxiety. 7. Attention-deficit disorder. 8. Gastric bypass procedure. 9. Gallbladder resection. 10.D and C. 11.Tonsillectomy.  MEDICATIONS PRIOR TO ADMISSION: 1. Nortriptyline 10 mg at night. 2. Dicyclomine 10 mg daily. 3. Loratadine 10 mg daily. 4. Zofran 1 tablet every 6 hours if needed. 5. Klonopin 0.5 mg three times daily if needed. 6. Promethazine 25 mg every 6 hours if needed.  The patient has an allergy to GABAPENTIN, REGLAN, LATEX, and CODEINE.  Please refer to history and physical dictation for review of systems, family history, social  history, and physical examination.  LABORATORY VALUES:  Notable for CBC with white count of 6.4, hemoglobin of 11.4, hematocrit of 33.9, platelets of 214.  Sodium 140, potassium 3.8, chloride of 105, CO2 of 26, glucose of 83, BUN of 7, creatinine of 0.61, calcium of 9.0.  Urinalysis revealed a specific gravity of 1.020, pH of 7.5, ketones of 15 mg/dL, 16-10 white cells.  HOSPITAL COURSE:  This patient was admitted to [facility] for evaluation of the headache.  The patient was placed on DHE-45 protocol, initially starting with 0.5 mg IV q.8 h.  The patient seemed to tolerate this fairly well and was increased to 1.0 mg every 8 hours associated with methylprednisolone 100 mg every 8 hours with the DHE-45 premedicated with Zofran 4 mg IV.  The patient received Dilaudid 1 mg every 2 hours initially and then increased to 3 mg every 3 hours.  The patient had some jerkiness on this dose and was reduced to 2 mg IV every 3 hours.  The patient has continued to complain of headaches throughout the hospitalization without benefit from the medication.  The patient is presumed to have a muscle contraction headache, not a migraine headache. The patient is to be discharged to home on August 06, 2011.  The patient will have an increase in the  nortriptyline and may be converted to Seroquel in the future.  The patient will follow up through [facility] within the next 2 to 3 months.  At the time of discharge, the patient is bright, alert, cooperative, does not appear to be in any apparent distress.     Marlan Palau, M.D.     CKW/MEDQ  D:  08/05/2011  T:  08/05/2011  Job:  [id number]  cc:   [facility]  Electronically Signed by Thana Farr M.D. on 09/14/2011 06:19:30 PM

## 2011-09-19 ENCOUNTER — Emergency Department (HOSPITAL_COMMUNITY)
Admission: EM | Admit: 2011-09-19 | Discharge: 2011-09-20 | Disposition: A | Discharge status: Home / Self Care | Payer: Medicaid Other | Attending: Emergency Medicine | Admitting: Emergency Medicine

## 2011-09-19 ENCOUNTER — Encounter (HOSPITAL_COMMUNITY): Payer: Self-pay | Admitting: *Deleted

## 2011-09-19 DIAGNOSIS — Z862 Personal history of diseases of the blood and blood-forming organs and certain disorders involving the immune mechanism: Secondary | ICD-10-CM | POA: Insufficient documentation

## 2011-09-19 DIAGNOSIS — E669 Obesity, unspecified: Secondary | ICD-10-CM | POA: Insufficient documentation

## 2011-09-19 DIAGNOSIS — F3289 Other specified depressive episodes: Secondary | ICD-10-CM | POA: Insufficient documentation

## 2011-09-19 DIAGNOSIS — R51 Headache: Secondary | ICD-10-CM

## 2011-09-19 DIAGNOSIS — F17200 Nicotine dependence, unspecified, uncomplicated: Secondary | ICD-10-CM | POA: Insufficient documentation

## 2011-09-19 DIAGNOSIS — K589 Irritable bowel syndrome without diarrhea: Secondary | ICD-10-CM | POA: Insufficient documentation

## 2011-09-19 DIAGNOSIS — Z9884 Bariatric surgery status: Secondary | ICD-10-CM | POA: Insufficient documentation

## 2011-09-19 DIAGNOSIS — F329 Major depressive disorder, single episode, unspecified: Secondary | ICD-10-CM | POA: Insufficient documentation

## 2011-09-19 DIAGNOSIS — G43909 Migraine, unspecified, not intractable, without status migrainosus: Secondary | ICD-10-CM | POA: Insufficient documentation

## 2011-09-19 MED ORDER — SODIUM CHLORIDE 0.9 % IV BOLUS (SEPSIS)
500.0000 mL | Freq: Once | INTRAVENOUS | Status: AC
  Administered 2011-09-19: 500 mL via INTRAVENOUS

## 2011-09-19 MED ORDER — SODIUM CHLORIDE 0.9 % IV BOLUS (SEPSIS)
1000.0000 mL | Freq: Once | INTRAVENOUS | Status: AC
  Administered 2011-09-19: 1000 mL via INTRAVENOUS

## 2011-09-19 MED ORDER — DIAZEPAM 5 MG PO TABS
5.0000 mg | ORAL_TABLET | Freq: Once | ORAL | Status: AC
  Administered 2011-09-19: 5 mg via ORAL
  Filled 2011-09-19: qty 1

## 2011-09-19 MED ORDER — KETOROLAC TROMETHAMINE 30 MG/ML IJ SOLN
30.0000 mg | Freq: Once | INTRAMUSCULAR | Status: AC
  Administered 2011-09-19: 30 mg via INTRAVENOUS
  Filled 2011-09-19: qty 1

## 2011-09-19 MED ORDER — HYDROMORPHONE HCL 1 MG/ML IJ SOLN
1.0000 mg | Freq: Once | INTRAMUSCULAR | Status: AC
  Administered 2011-09-19: 1 mg via INTRAVENOUS
  Filled 2011-09-19: qty 1

## 2011-09-19 NOTE — ED Notes (Signed)
Pt c/o lower back cramps/pain and migraine x 1day. Pt has taken sudafed and phenergan with no relief. Pt c/o sensitivity to light.

## 2011-09-19 NOTE — ED Provider Notes (Signed)
History  Scribed for Dr. Rubin Payor, the patient was seen in room APA03. The chart was scribed by Gilman Schmidt. The patients care was started at 2238. CSN: [id number] Arrival date & time: 09/19/2011 10:29 PM  Chief Complaint  Patient presents with  . Migraine  . Back Pain    HPI Penny [name] is a 25 y.o. female who presents to the Emergency Department complaining of migraines onset one day. Pt additionally reports lower back cramps and pain.  Pt has taken Sudafed and Phenergan with no relief. Pt c/o sensitivity. Pt was seen in the ED 07/25/11 for similar symptoms. Pt additionally notes having LNMP one week ago with severe cramps and more frequent bleeding. Pt currently is on IUD. There are no other associated symptoms and no other alleviating or aggravating factors.   Past Medical History  Diagnosis Date  . Migraines   . Interstitial cystitis   . IUD FEB 2010  . Gastritis JULY 2011  . Depression   . 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 Fatty liver   . Obesity (BMI 30-39.9) 2011 228 LBS BMI 36.8  . Iron deficiency anemia 07/23/2010  . Irritable bowel syndrome 2012 DIARRHEA    JUN 2012 TTG IgA 14.9  . Elevated liver enzymes JUL 2011ALK PHOS 111-127 AST  143-267 ALT  213-321T BILI 0.6  ALB  3.7-4.06 Jun 2011 ALK PHOS 118 AST 24 ALT 42 T BILI 0.4 ALB 3.9    Past Surgical History  Procedure Date  . Gab 2007    in [place]-POUCH 5 CM  . Cholecystectomy 2005    biliary dyskinesia  . Cath self every nite     for sodium bicarb injection  . Colonoscopy JUN 2012 ABD PN/DIARRHEA WITH PROPOFOL    NL COLON  . Upper gastrointestinal endoscopy JULY 2011 NAUSEA-D125,V6, PH 25    Bx; GASTRITIS, POUCH-5 CM LONG    Family History  Problem Relation Age of Onset  . Hemochromatosis Maternal Grandmother   . Migraines Maternal Grandmother   . Hypertension Father   . Diabetes Father   . Migraines Paternal Grandmother     History  Substance Use Topics  . Smoking status: Current  Everyday Smoker -- 0.5 packs/day    Types: Cigarettes  . Smokeless tobacco: Not on file  . Alcohol Use: No    OB History    Grav Para Term Preterm Abortions TAB SAB Ect Mult Living   1 1 1       1       Review of Systems  Eyes: Positive for photophobia.  Musculoskeletal: Positive for back pain.  Neurological: Positive for headaches.    Allergies  Codeine; Gabapentin; Latex; and Reglan  Home Medications   Current Outpatient Rx  Name Route Sig Dispense Refill  . CLONAZEPAM 0.5 MG PO TABS Oral Take 0.5 mg by mouth 2 (two) times daily as needed. For anxiety    . DEXLANSOPRAZOLE 60 MG PO CPDR Oral Take 60 mg by mouth daily.      Marland Kitchen DICYCLOMINE HCL 10 MG PO CAPS Oral Take 10 mg by mouth 4 (four) times daily -  before meals and at bedtime.      Marland Kitchen HYDROCODONE-ACETAMINOPHEN 10-325 MG PO TABS Oral Take 1 tablet by mouth every 6 (six) hours as needed. For pain    . LORATADINE 10 MG PO TABS Oral Take 10 mg by mouth daily.      Marland Kitchen NORTRIPTYLINE HCL 10  MG PO CAPS Oral Take 10 mg by mouth at bedtime.      Marland Kitchen PHENAZOPYRIDINE HCL 200 MG PO TABS Oral Take 200 mg by mouth 3 (three) times daily as needed. bladder    . QUETIAPINE FUMARATE 50 MG PO TABS Oral Take 50 mg by mouth at bedtime.      . SODIUM BICARBONATE 4 % IV SOLN Intravenous Inject 5 mLs into the vein at bedtime. Sod. Bicarb. 5cc + Lidocaine 10cc +heparin 4cc injected thru urinary catheter for intersistal cystitis       . SUMATRIPTAN SUCCINATE 100 MG PO TABS Oral Take 100 mg by mouth every 2 (two) hours as needed.        BP 135/83  Pulse 104  Temp(Src) 98.3 F (36.8 C) (Oral)  Resp 16  Ht 5\' 6"  (1.676 m)  Wt 220 lb (99.791 kg)  BMI 35.51 kg/m2  SpO2 100%  Physical Exam  Constitutional: She is oriented to person, place, and time. She appears well-developed and well-nourished.  Non-toxic appearance. She does not have a sickly appearance.  HENT:  Head: Normocephalic and atraumatic.  Eyes: Conjunctivae, EOM and lids are normal.  Pupils are equal, round, and reactive to light. No scleral icterus.  Neck: Trachea normal and normal range of motion. Neck supple.  Cardiovascular: Normal rate, regular rhythm and normal heart sounds.   Pulmonary/Chest: Effort normal and breath sounds normal.  Abdominal: Soft. Normal appearance and bowel sounds are normal. There is no tenderness. There is no rebound, no guarding and no CVA tenderness.  Musculoskeletal: Normal range of motion.       Tenderness to right occipital   Neurological: She is alert and oriented to person, place, and time. She has normal strength.  Skin: Skin is warm, dry and intact. No rash noted.    ED Course  Procedures  DIAGNOSTIC STUDIES: Oxygen Saturation is 100% on room, normal by my interpretation.    COORDINATION OF CARE: 2238:  - Patient evaluated by ED physician,  Toradol, Dilaudid, Valium ordered   MDM  Headache like typical migraine. History of severe headaches in the past. Also some back cramping. No relief with the initial treatments. I have added Phenergan.  I personally performed the services described in this documentation, which was scribed in my presence. The recorded information has been reviewed and considered.         Juliet Rude. Rubin Payor, MD 09/20/11 [id number]

## 2011-09-20 ENCOUNTER — Other Ambulatory Visit (HOSPITAL_COMMUNITY): Payer: Medicaid Other

## 2011-09-20 LAB — POCT PREGNANCY, URINE: Preg Test, Ur: NEGATIVE

## 2011-09-20 MED ORDER — PROMETHAZINE HCL 25 MG/ML IJ SOLN
25.0000 mg | Freq: Once | INTRAMUSCULAR | Status: AC
  Administered 2011-09-20: 25 mg via INTRAVENOUS
  Filled 2011-09-20: qty 1

## 2011-09-20 NOTE — ED Notes (Signed)
Dr notified of pt's statement "if you aren't gonna do any thing else i might as well go home". No additional orders from doctor.

## 2011-09-20 NOTE — ED Notes (Signed)
Pt self ambulated out with a steady and purposeful gait, eyes wide open rating pain 8/10.

## 2011-09-20 NOTE — Progress Notes (Signed)
0028 Assumed care/disposition of patient. Patient with h/o frequent migraines here with a headache. She has recently been seen by a headache clinic in [place] and begun on a new routine which has been working well and keeping her from frequent visits to the ER. This headache did not respond to her regular medicines. Has just received analgesics. Observation for response. 0230 Patient with decreased pain. Able to go home. The patient appears reasonably screened and/or stabilized for discharge and I doubt any other medical condition or other [facility] requiring further screening, evaluation, or treatment in the ED at this time prior to discharge.

## 2011-09-20 NOTE — ED Notes (Signed)
Pt pulled out her own iv and placed it on the bedside table.

## 2011-09-20 NOTE — ED Notes (Signed)
Pt self ambulated to the rest room and back with a steady gait, eyes wide open no noted distress. Pt stating pain 8/10.

## 2011-09-21 ENCOUNTER — Encounter (HOSPITAL_COMMUNITY): Payer: Medicaid Other | Attending: Oncology

## 2011-09-21 DIAGNOSIS — D509 Iron deficiency anemia, unspecified: Secondary | ICD-10-CM | POA: Insufficient documentation

## 2011-09-21 LAB — CBC
HCT: 35.7 % — ABNORMAL LOW (ref 36.0–46.0)
Hemoglobin: 12 g/dL (ref 12.0–15.0)
MCHC: 33.6 g/dL (ref 30.0–36.0)
RBC: 3.76 MIL/uL — ABNORMAL LOW (ref 3.87–5.11)

## 2011-09-21 NOTE — Progress Notes (Signed)
Penny [name] presented for labwork. Labs per MD order drawn via Peripheral Line 25  gauge needle inserted in lt AC.  Good blood return present. Procedure without incident.  Needle removed intact. Patient tolerated procedure well.

## 2011-09-22 ENCOUNTER — Ambulatory Visit (HOSPITAL_COMMUNITY): Payer: Medicaid Other | Admitting: Oncology

## 2011-09-22 LAB — FERRITIN: Ferritin: 20 ng/mL (ref 10–291)

## 2011-09-23 ENCOUNTER — Encounter (HOSPITAL_BASED_OUTPATIENT_CLINIC_OR_DEPARTMENT_OTHER): Payer: Medicaid Other | Admitting: Oncology

## 2011-09-23 VITALS — BP 139/90 | HR 103 | Temp 97.8°F | Ht 66.0 in | Wt 225.0 lb

## 2011-09-23 DIAGNOSIS — F329 Major depressive disorder, single episode, unspecified: Secondary | ICD-10-CM

## 2011-09-23 DIAGNOSIS — F3289 Other specified depressive episodes: Secondary | ICD-10-CM

## 2011-09-23 DIAGNOSIS — D509 Iron deficiency anemia, unspecified: Secondary | ICD-10-CM

## 2011-09-23 DIAGNOSIS — E669 Obesity, unspecified: Secondary | ICD-10-CM

## 2011-09-23 IMAGING — CT CT HEAD W/O CM
1 of 2 series · 13 of 30 positions shown, 17 images · non-contrast
Comparison: 06/30/2010

CLINICAL DATA: Migraines.

CT HEAD WITHOUT CONTRAST
TECHNIQUE: Contiguous axial images were obtained from the base of
the skull through the vertex without contrast.

[Series 2: brain · axial · 0.47mm/px · z∈[+131,+251]mm · 13 of 28 slices shown, 17 images]
[im 2/28  brain]
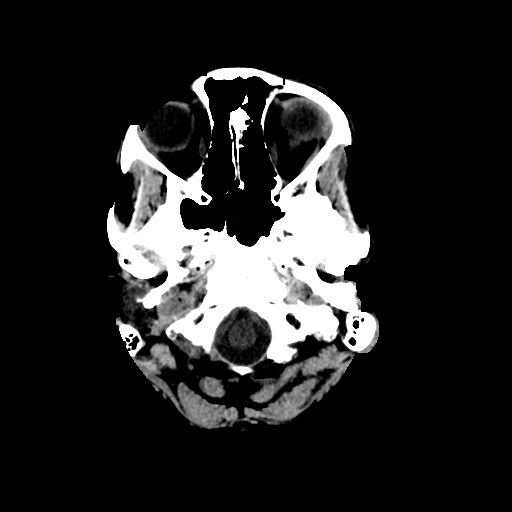
[im 2/28  bone]
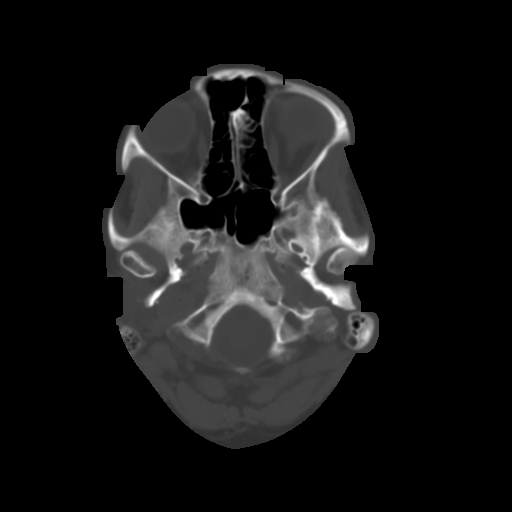
[im 4/28  brain]
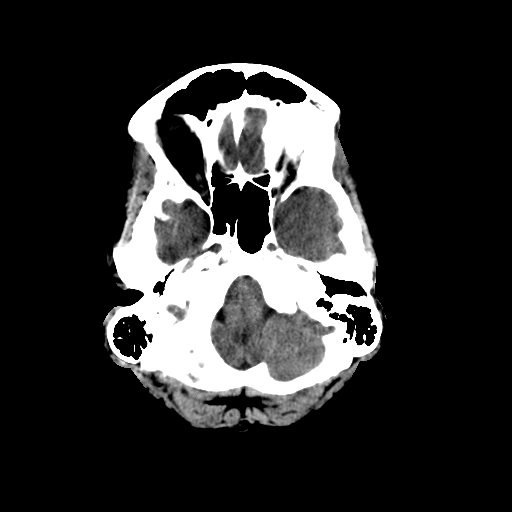
[im 6/28  brain]
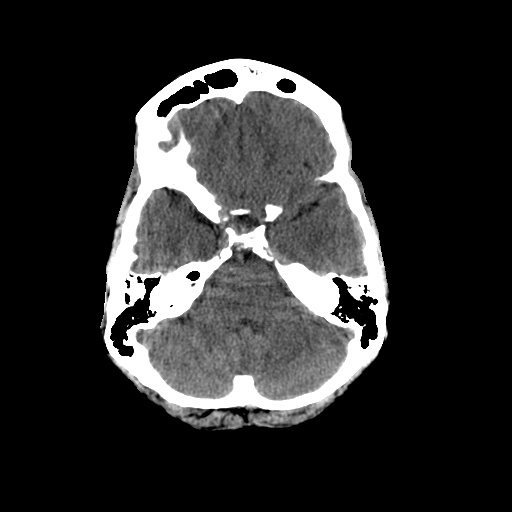
[im 8/28  brain]
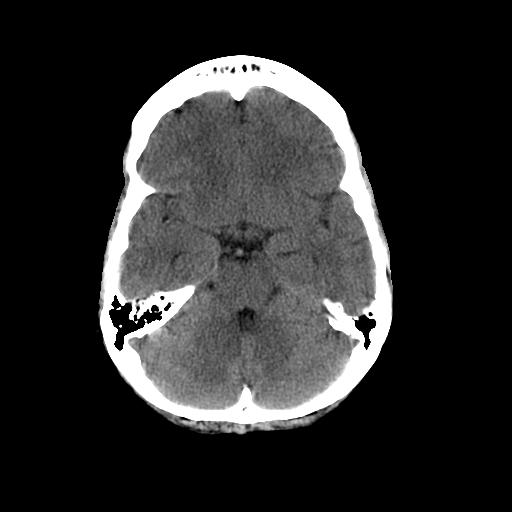
[im 10/28  brain]
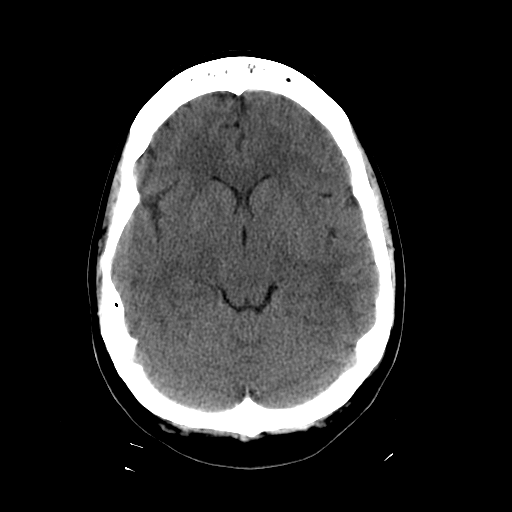
[im 10/28  bone]
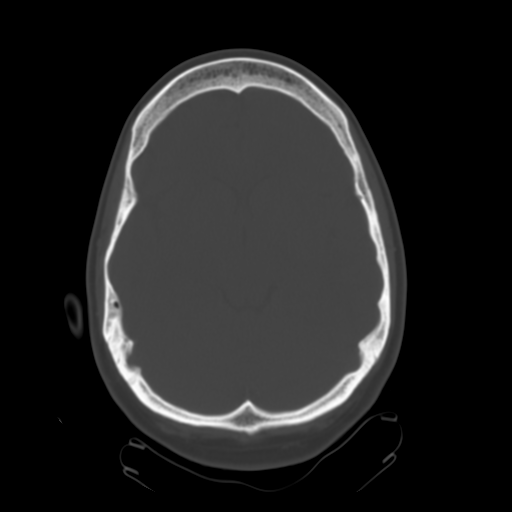
[im 12/28  brain]
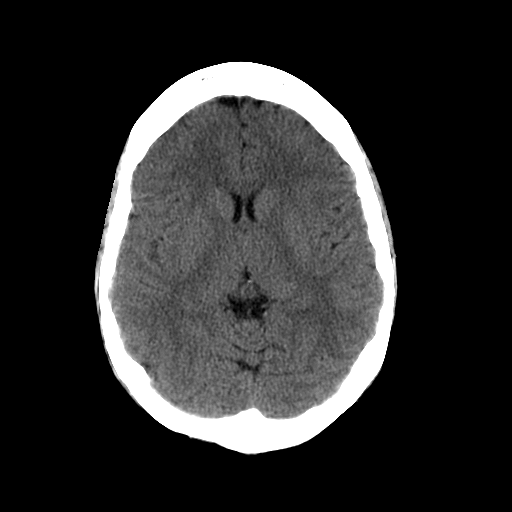
[im 14/28  brain]
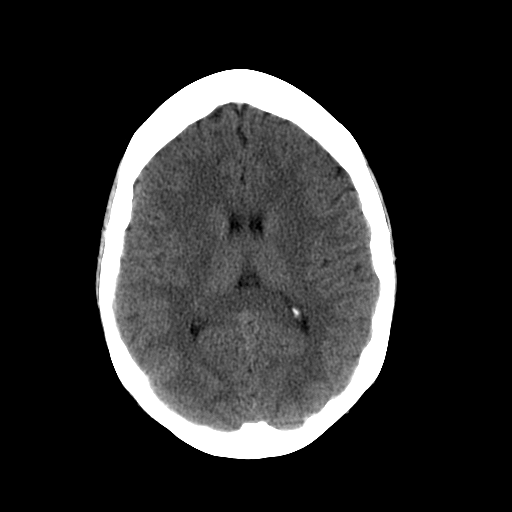
[im 16/28  brain]
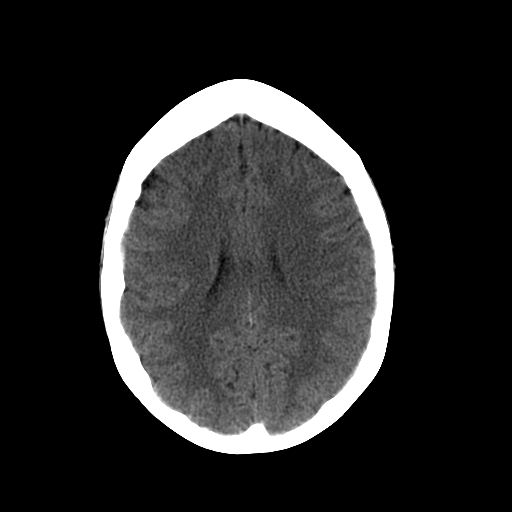
[im 18/28  brain]
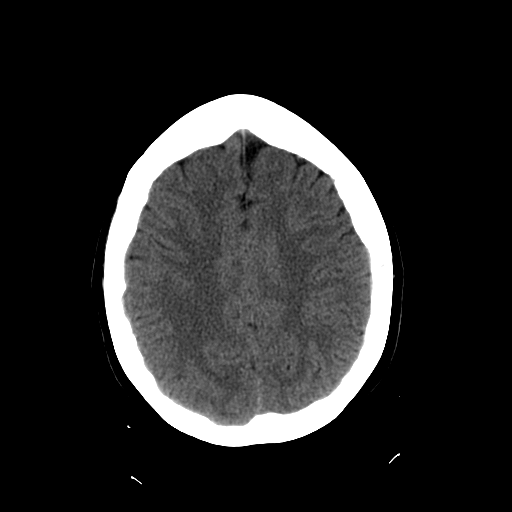
[im 18/28  bone]
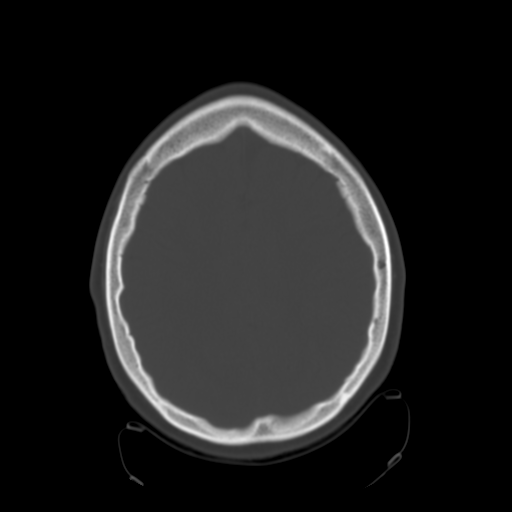
[im 20/28  brain]
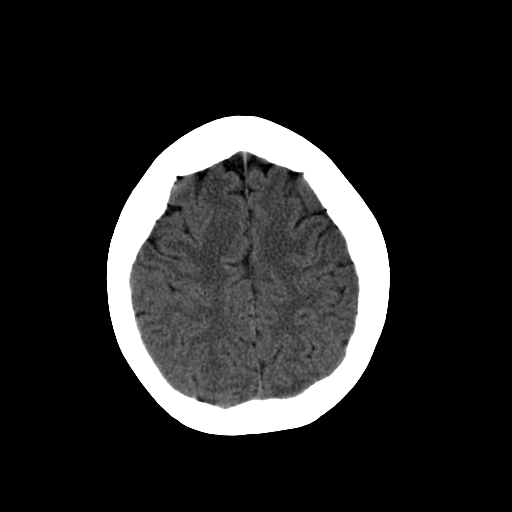
[im 22/28  brain]
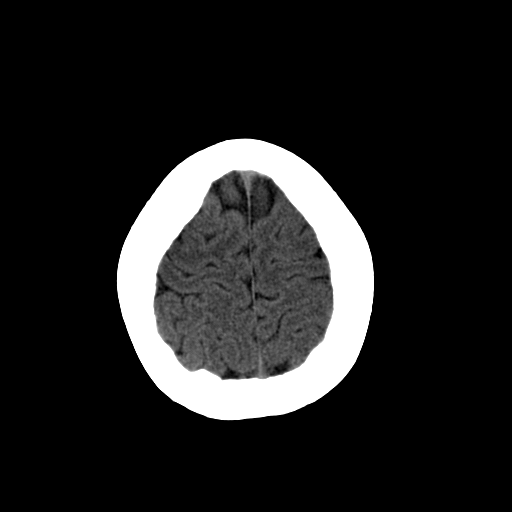
[im 24/28  brain]
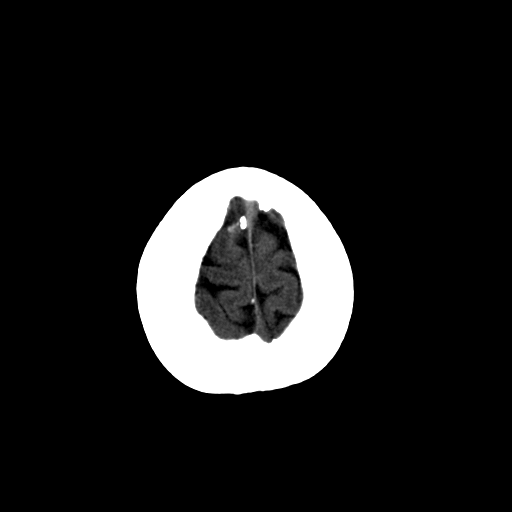
[im 26/28  brain]
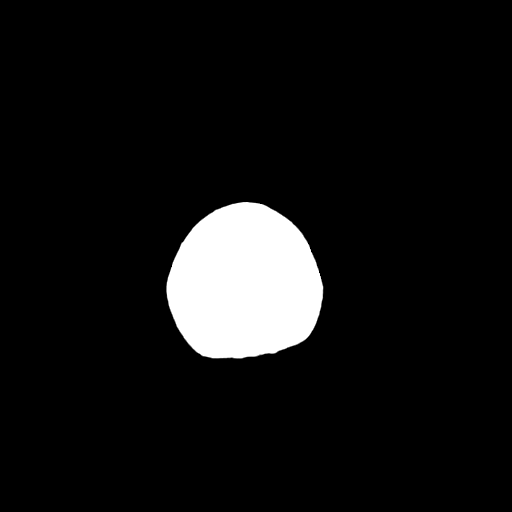
[im 26/28  bone]
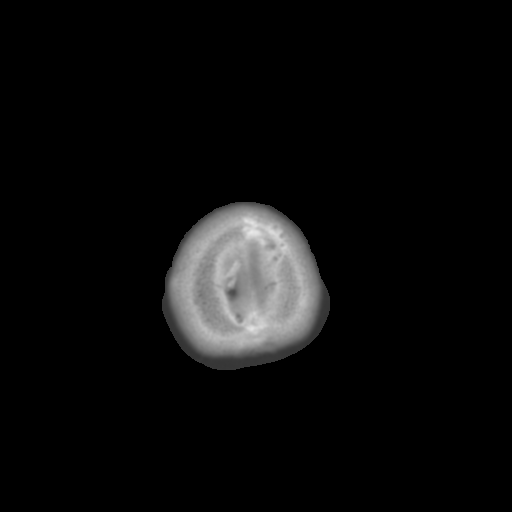

[13 of 30 positions shown; findings below may reference images not displayed]

FINDINGS: There is no evidence for acute hemorrhage, hydrocephalus,
mass lesion, or abnormal extra-axial fluid collection.  No definite
CT evidence for acute infarction.  The visualized paranasal sinuses
and mastoid air cells are predominately clear.  The
IMPRESSION: No acute intracranial abnormality.

## 2011-09-23 NOTE — Patient Instructions (Signed)
[facility]  Discharge Instructions  RECOMMENDATIONS MADE BY THE CONSULTANT AND ANY TEST RESULTS WILL BE SENT TO YOUR REFERRING DOCTOR.   EXAM FINDINGS BY MD TODAY AND SIGNS AND SYMPTOMS TO REPORT TO CLINIC OR PRIMARY MD: Per Jenita Seashore PA   MEDICATIONS PRESCRIBED:  Feraheme sometime next week   INSTRUCTIONS GIVEN AND DISCUSSED:   SPECIAL INSTRUCTIONS/FOLLOW-UP: Other (Referral/Appointments) lab work monthly and office visit in 4 months.   I acknowledge that I have been informed and understand all the instructions given to me and received a copy. I do not have any more questions at this time, but understand that I may call the Specialty Clinic at [facility] at [phone] during business hours should I have any further questions or need assistance in obtaining follow-up care.    __________________________________________  _____________  __________ Signature of Patient or Authorized Representative            Date                   Time    __________________________________________ Nurse's Signature

## 2011-09-23 NOTE — Progress Notes (Signed)
Penny Punt, MD, MD [address] [place] [address]  1. Iron deficiency anemia  eletriptan (RELPAX) 40 MG tablet, CBC, CBC, CBC, CBC, Ferritin, Ferritin, Ferritin, Ferritin    CURRENT THERAPY: S/P Feraheme IV iron infusion on 06/22/2011.  INTERVAL HISTORY: NOLAN LASSER 25 y.o. female returns for  regular  visit for followup of iron deficiency anemia.  The patient reports that her ice craving has decreased. She also explains that her energy level has significantly increased. She does report some intermittent headaches. Fortunately these have increased since her Feraheme infusion. I am not positive these are related.  I personally reviewed and went over laboratory results with the patient. The patient's blood counts have significantly increased. Her hemoglobin is now within normal limits and so is her MCV. Her ferritin is now 20 compared to 2.   Past Medical History  Diagnosis Date  . Migraines   . Interstitial cystitis   . IUD FEB 2010  . Gastritis JULY 2011  . Depression   . 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 Fatty liver   . Obesity (BMI 30-39.9) 2011 228 LBS BMI 36.8  . Iron deficiency anemia 07/23/2010  . Irritable bowel syndrome 2012 DIARRHEA    JUN 2012 TTG IgA 14.9  . Elevated liver enzymes JUL 2011ALK PHOS 111-127 AST  143-267 ALT  213-321T BILI 0.6  ALB  3.7-4.06 Jun 2011 ALK PHOS 118 AST 24 ALT 42 T BILI 0.4 ALB 3.9    has OBESITY, UNSPECIFIED; Iron deficiency anemia; LIVER FUNCTION TESTS, ABNORMAL, HX OF; Diarrhea; and IBS (irritable bowel syndrome) on her problem list.     is allergic to codeine; gabapentin; latex; and reglan.  [name] does not currently have medications on file.  Past Surgical History  Procedure Date  . Gab 2007    in [place]-POUCH 5 CM  . Cholecystectomy 2005    biliary dyskinesia  . Cath self every nite     for sodium bicarb injection  . Colonoscopy JUN 2012 ABD PN/DIARRHEA WITH PROPOFOL    NL COLON  . Upper  gastrointestinal endoscopy JULY 2011 NAUSEA-D125,V6, PH 25    Bx; GASTRITIS, POUCH-5 CM LONG    Denies any headaches, dizziness, double vision, fevers, chills, night sweats, nausea, vomiting, diarrhea, constipation, chest pain, heart palpitations, shortness of breath, blood in stool, black tarry stool, urinary pain, urinary burning, urinary frequency, hematuria.   PHYSICAL EXAMINATION  ECOG PERFORMANCE STATUS: 1 - Symptomatic but completely ambulatory  Filed Vitals:   09/23/11 1020  BP: 139/90  Pulse: 103  Temp: 97.8 F (36.6 C)    GENERAL:alert, no distress, well nourished, well developed, obese and smiling SKIN: skin color, texture, turgor are normal HEAD: Normocephalic EYES: normal EARS: External ears normal OROPHARYNX:mucous membranes are moist  NECK: supple, no adenopathy, no bruits, no JVD, thyroid normal size, non-tender, without nodularity, no stridor, non-tender, trachea midline LYMPH:  no palpable lymphadenopathy BREAST:not examined LUNGS: clear to auscultation and percussion HEART: regular rate & rhythm, no murmurs, no gallops, S1 normal and S2 normal ABDOMEN:abdomen soft, non-tender, obese and normal bowel sounds BACK: No CVA tenderness EXTREMITIES:less then 2 second capillary refill, no joint deformities, effusion, or inflammation, no edema, no skin discoloration, no clubbing, no cyanosis. Slight flattening of the index finger's nail B/L  NEURO: alert & oriented x 3 with fluent speech, no focal motor/sensory deficits, gait normal   LABORATORY DATA: CBC    Component Value Date/Time  WBC 6.4 09/21/2011 1605   RBC 3.76* 09/21/2011 1605   HGB 12.0 09/21/2011 1605   HCT 35.7* 09/21/2011 1605   PLT 241 09/21/2011 1605   MCV 94.9 09/21/2011 1605   MCH 31.9 09/21/2011 1605   MCHC 33.6 09/21/2011 1605   RDW 14.3 09/21/2011 1605   LYMPHSABS 3.3 08/03/2011 2050   MONOABS 0.4 08/03/2011 2050   EOSABS 0.1 08/03/2011 2050   BASOSABS 0.0 08/03/2011 2050    Lab Results   Component Value Date   FERRITIN 20 09/21/2011      ASSESSMENT:  1. iron deficiency anemia, presenting with a ferritin of 2 2. Status post gastric bypass surgery 3. Obesity 4. Ice craving 5. Interstitial cystitis 6. Depression 7. History of transaminitis 8. History of migraines   PLAN:  1. I personally reviewed and went over laboratory results with the patient. 2. will administer Feraheme 510 mg IV this week or next week. Her ferritin rose nicely following Feraheme 1020 mg infusion, but most recently her ferritin is only 20. I would like to increase this to nearly 100 if possible. As result we'll cut the dose in half and therefore administer Feraheme 510 mg IV. 3. monthly laboratory work: CBC and ferritin 4. Patient return to clinic in 3-4 months time for followup.    All questions were answered. The patient knows to call the clinic with any problems, questions or concerns. We can certainly see the patient much sooner if necessary.   KEFALAS,THOMAS

## 2011-09-25 IMAGING — CT CT HEAD W/O CM
1 of 2 series · 13 of 30 positions shown, 17 images · non-contrast
Comparison: 08/03/2011

CLINICAL DATA: Facial droop, blurred vision, headache

CT HEAD WITHOUT CONTRAST
TECHNIQUE: Contiguous axial images were obtained from the base of
the skull through the vertex without contrast.

[Series 2: brain · axial · 0.47mm/px · z∈[+94,+214]mm · 13 of 28 slices shown, 17 images]
[im 2/28  brain]
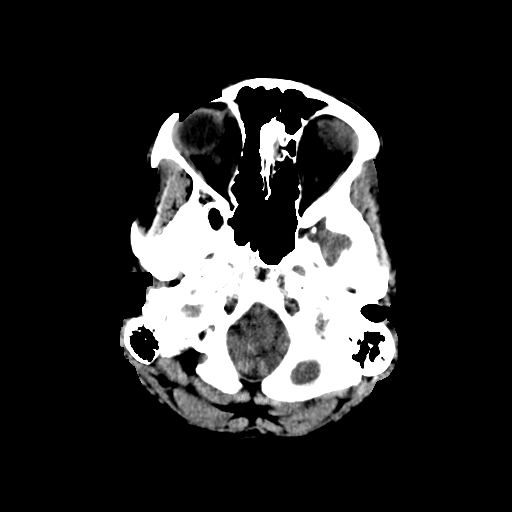
[im 2/28  bone]
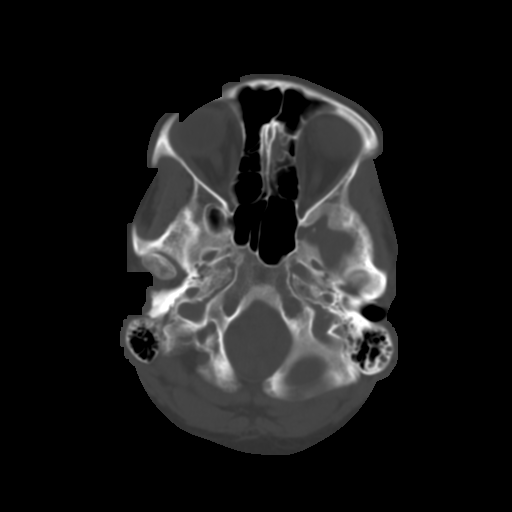
[im 4/28  brain]
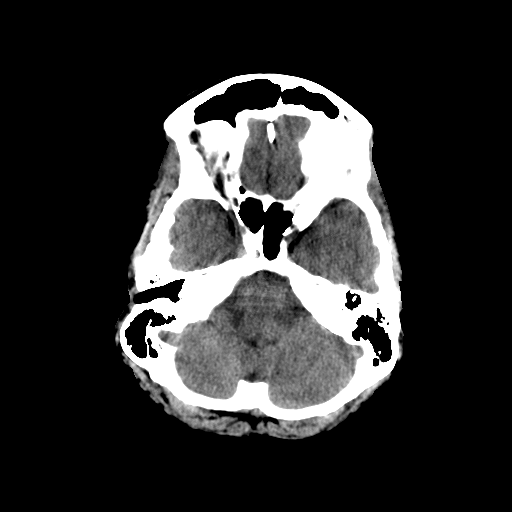
[im 6/28  brain]
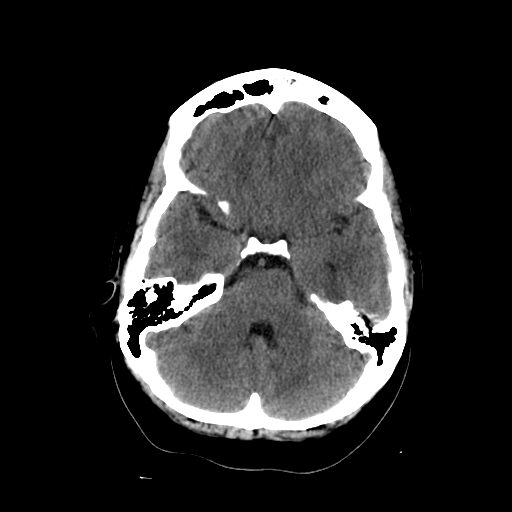
[im 8/28  brain]
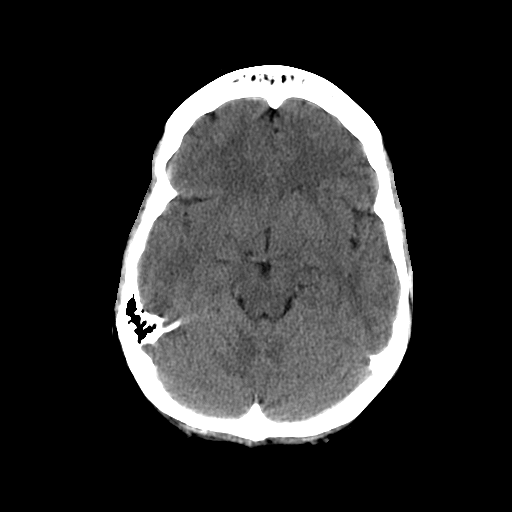
[im 10/28  brain]
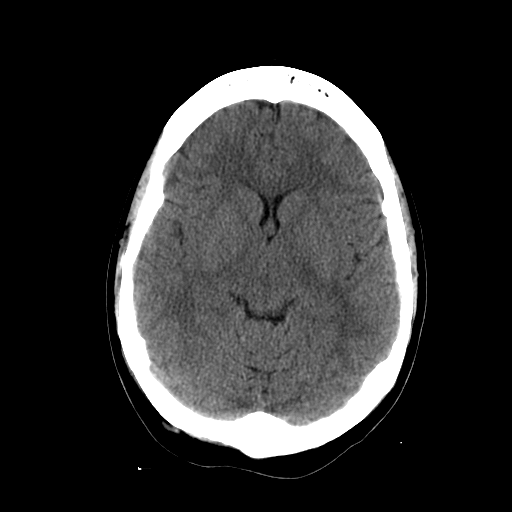
[im 10/28  bone]
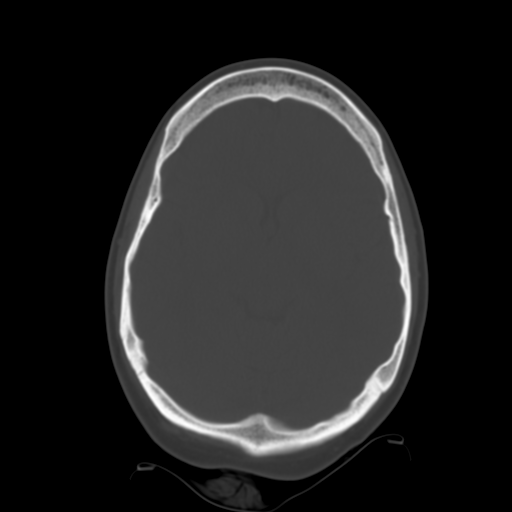
[im 12/28  brain]
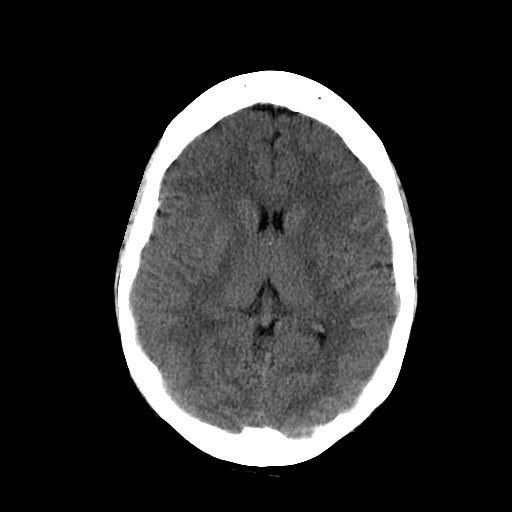
[im 14/28  brain]
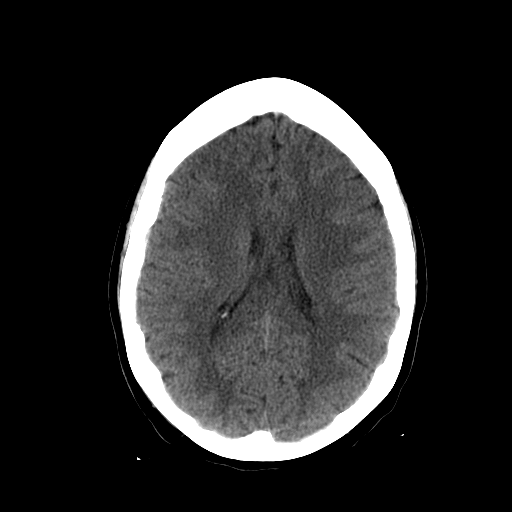
[im 16/28  brain]
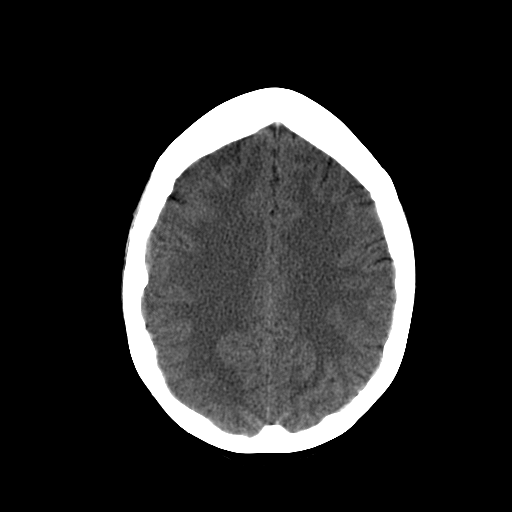
[im 18/28  brain]
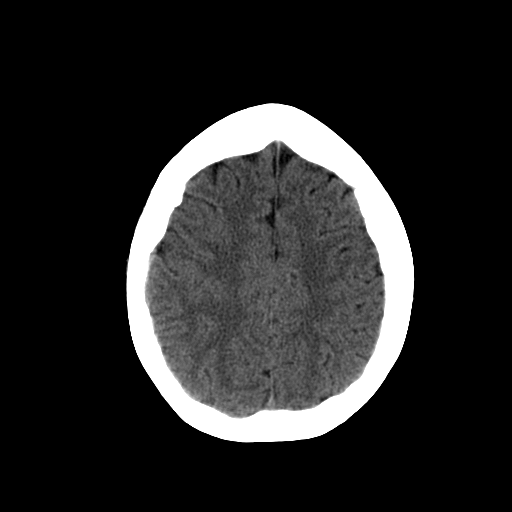
[im 18/28  bone]
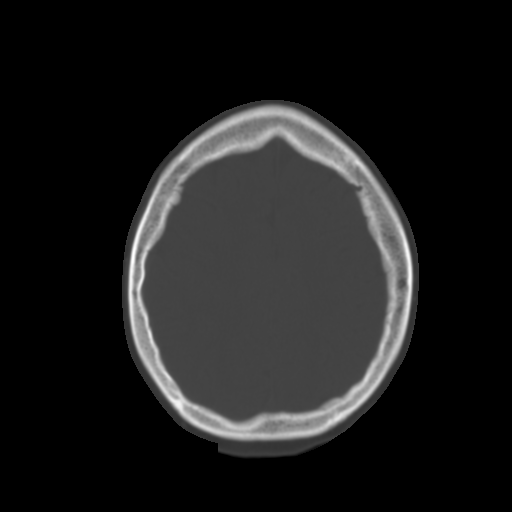
[im 20/28  brain]
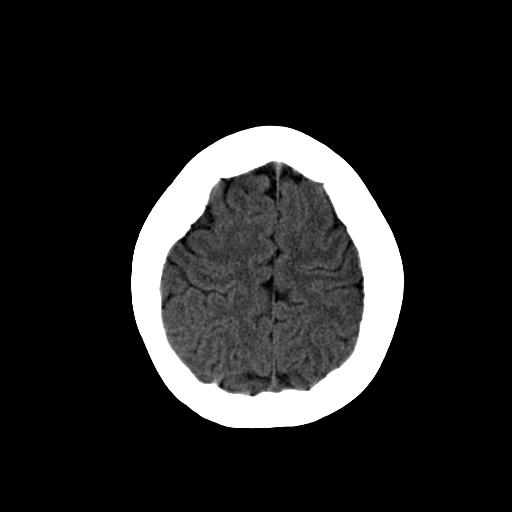
[im 22/28  brain]
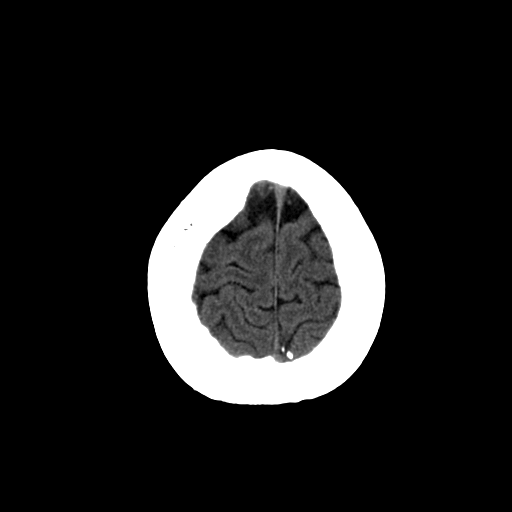
[im 24/28  brain]
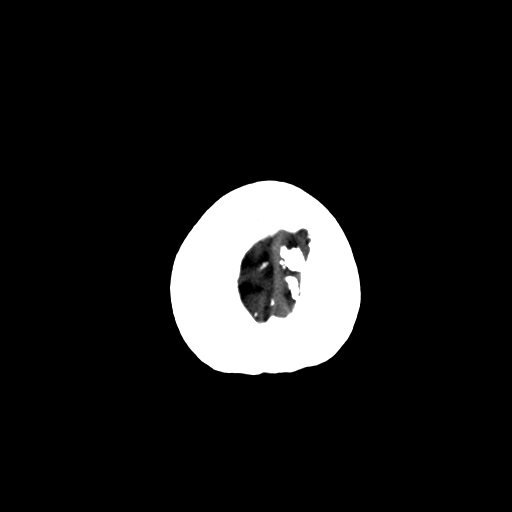
[im 26/28  brain]
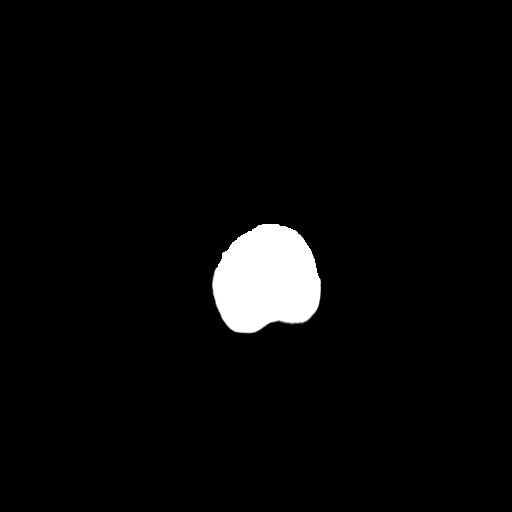
[im 26/28  bone]
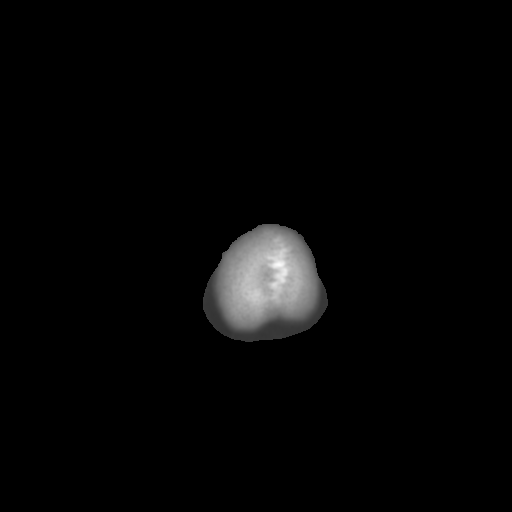

[13 of 30 positions shown; findings below may reference images not displayed]

FINDINGS: No acute hemorrhage, acute infarction, or mass lesion is
identified.  No midline shift.  No ventriculomegaly.  No skull
fracture.
IMPRESSION: Normal exam.

## 2011-09-27 ENCOUNTER — Ambulatory Visit (HOSPITAL_COMMUNITY): Payer: Medicaid Other

## 2011-10-05 ENCOUNTER — Encounter (HOSPITAL_BASED_OUTPATIENT_CLINIC_OR_DEPARTMENT_OTHER): Payer: Medicaid Other

## 2011-10-05 VITALS — BP 122/85 | HR 84 | Temp 97.0°F

## 2011-10-05 DIAGNOSIS — D509 Iron deficiency anemia, unspecified: Secondary | ICD-10-CM

## 2011-10-05 MED ORDER — FERUMOXYTOL INJECTION 510 MG/17 ML
510.0000 mg | Freq: Once | INTRAVENOUS | Status: AC
  Administered 2011-10-05: 510 mg via INTRAVENOUS
  Filled 2011-10-05: qty 17

## 2011-10-05 MED ORDER — SODIUM CHLORIDE 0.9 % IJ SOLN
10.0000 mL | INTRAMUSCULAR | Status: DC | PRN
  Filled 2011-10-05: qty 10

## 2011-10-05 MED ORDER — SODIUM CHLORIDE 0.9 % IV SOLN
INTRAVENOUS | Status: DC
  Administered 2011-10-05: 12:00:00 via INTRAVENOUS

## 2011-10-09 ENCOUNTER — Encounter (HOSPITAL_COMMUNITY): Payer: Self-pay

## 2011-10-09 ENCOUNTER — Emergency Department (HOSPITAL_COMMUNITY)
Admission: EM | Admit: 2011-10-09 | Discharge: 2011-10-09 | Disposition: A | Discharge status: Home / Self Care | Payer: Medicaid Other | Attending: Emergency Medicine | Admitting: Emergency Medicine

## 2011-10-09 DIAGNOSIS — N390 Urinary tract infection, site not specified: Secondary | ICD-10-CM

## 2011-10-09 DIAGNOSIS — F17200 Nicotine dependence, unspecified, uncomplicated: Secondary | ICD-10-CM | POA: Insufficient documentation

## 2011-10-09 DIAGNOSIS — Z9884 Bariatric surgery status: Secondary | ICD-10-CM | POA: Insufficient documentation

## 2011-10-09 DIAGNOSIS — R10819 Abdominal tenderness, unspecified site: Secondary | ICD-10-CM | POA: Insufficient documentation

## 2011-10-09 DIAGNOSIS — K589 Irritable bowel syndrome without diarrhea: Secondary | ICD-10-CM | POA: Insufficient documentation

## 2011-10-09 DIAGNOSIS — Z975 Presence of (intrauterine) contraceptive device: Secondary | ICD-10-CM | POA: Insufficient documentation

## 2011-10-09 DIAGNOSIS — Z862 Personal history of diseases of the blood and blood-forming organs and certain disorders involving the immune mechanism: Secondary | ICD-10-CM | POA: Insufficient documentation

## 2011-10-09 DIAGNOSIS — R51 Headache: Secondary | ICD-10-CM | POA: Insufficient documentation

## 2011-10-09 DIAGNOSIS — R112 Nausea with vomiting, unspecified: Secondary | ICD-10-CM | POA: Insufficient documentation

## 2011-10-09 LAB — CBC
HCT: 33.9 % — ABNORMAL LOW (ref 36.0–46.0)
Hemoglobin: 11.6 g/dL — ABNORMAL LOW (ref 12.0–15.0)
RDW: 12.3 % (ref 11.5–15.5)
WBC: 9 10*3/uL (ref 4.0–10.5)

## 2011-10-09 LAB — URINALYSIS, ROUTINE W REFLEX MICROSCOPIC
Glucose, UA: 500 mg/dL — AB
Hgb urine dipstick: NEGATIVE
pH: 6.5 (ref 5.0–8.0)

## 2011-10-09 LAB — URINE MICROSCOPIC-ADD ON

## 2011-10-09 LAB — DIFFERENTIAL
Basophils Absolute: 0 10*3/uL (ref 0.0–0.1)
Basophils Relative: 0 % (ref 0–1)
Lymphocytes Relative: 20 % (ref 12–46)
Monocytes Absolute: 1 10*3/uL (ref 0.1–1.0)
Monocytes Relative: 11 % (ref 3–12)
Neutro Abs: 6.1 10*3/uL (ref 1.7–7.7)
Neutrophils Relative %: 67 % (ref 43–77)

## 2011-10-09 LAB — BASIC METABOLIC PANEL
CO2: 29 mEq/L (ref 19–32)
Chloride: 100 mEq/L (ref 96–112)
Creatinine, Ser: 0.6 mg/dL (ref 0.50–1.10)
GFR calc Af Amer: >90 mL/min (ref >90)
Potassium: 3.9 mEq/L (ref 3.5–5.1)

## 2011-10-09 MED ORDER — ONDANSETRON HCL 4 MG/2ML IJ SOLN
4.0000 mg | Freq: Once | INTRAMUSCULAR | Status: AC
  Administered 2011-10-09: 4 mg via INTRAVENOUS
  Filled 2011-10-09: qty 2

## 2011-10-09 MED ORDER — CIPROFLOXACIN HCL 500 MG PO TABS
500.0000 mg | ORAL_TABLET | Freq: Two times a day (BID) | ORAL | Status: AC
Start: 2011-10-09 — End: 2011-10-19

## 2011-10-09 MED ORDER — SODIUM CHLORIDE 0.9 % IV BOLUS (SEPSIS)
1000.0000 mL | Freq: Once | INTRAVENOUS | Status: AC
  Administered 2011-10-09: 1000 mL via INTRAVENOUS

## 2011-10-09 MED ORDER — HYDROMORPHONE HCL PF 1 MG/ML IJ SOLN
1.0000 mg | Freq: Once | INTRAMUSCULAR | Status: AC
  Administered 2011-10-09: 1 mg via INTRAVENOUS
  Filled 2011-10-09: qty 1

## 2011-10-09 MED ORDER — OXYCODONE-ACETAMINOPHEN 5-325 MG PO TABS: 1.0000 | ORAL_TABLET | ORAL | Status: AC | PRN

## 2011-10-09 MED ORDER — MORPHINE SULFATE 4 MG/ML IJ SOLN
6.0000 mg | Freq: Once | INTRAMUSCULAR | Status: AC
  Administered 2011-10-09: 6 mg via INTRAVENOUS
  Filled 2011-10-09: qty 1

## 2011-10-09 MED ORDER — MORPHINE SULFATE 2 MG/ML IJ SOLN
INTRAMUSCULAR | Status: AC
  Filled 2011-10-09: qty 1

## 2011-10-09 MED ORDER — PROMETHAZINE HCL 25 MG PO TABS: 25.0000 mg | ORAL_TABLET | Freq: Four times a day (QID) | ORAL | Status: DC | PRN

## 2011-10-09 MED ORDER — CIPROFLOXACIN IN D5W 400 MG/200ML IV SOLN
400.0000 mg | Freq: Once | INTRAVENOUS | Status: AC
  Administered 2011-10-09: 400 mg via INTRAVENOUS
  Filled 2011-10-09: qty 200

## 2011-10-09 NOTE — ED Notes (Signed)
Pt presents with headache, UTI sx, and n/v x 3 days. Pt states she has chronic cystitis and is prone to UTI. Pt c/o low back pain, n/v and burning during urination. Pt has been taking Pyridium.

## 2011-10-09 NOTE — ED Notes (Signed)
Patient reports she is only able to "dribble" urine. Patient self caths at home or uses significant other's help to catheterize at home.   Significant other requesting to catheterize patient. Patient also requesting that he be the one to cath her. Gwenlyn Found, ED tech at bedside to supervise catheterization of patient by patient's significant other.

## 2011-10-09 NOTE — ED Provider Notes (Signed)
History    Scribed for Benny Lennert, MD, the patient was seen in room APA05/APA05. This chart was scribed by Katha Cabal.   CSN: [id number] Arrival date & time: 10/09/2011 11:20 AM   First MD Initiated Contact with Patient 10/09/11 1221      Chief Complaint  Patient presents with  . Urinary Tract Infection  . Nausea  . Emesis  . Headache    (Consider location/radiation/quality/duration/timing/severity/associated sxs/prior treatment) HPI Penny [name] is a 25 y.o. female with history of UTIs presents to the Emergency Department complaining of another episode of constant moderate burning and painful urination with associated low back pain, nausea and vomiting that persist in ED.   Symptoms began 3 days ago and got worse yesterday.  Patient reports onset of vomiting today.  Last UTI episode was 3 months ago with catheter treatment.  Patient reports multiple episodes of UTIs.  Patient has taken Pyridium.  Patient states previous episodes were treated by Cipro by IV and discharged with liquid Cipro.  Patient has appointment with Dr. Logan Bores next week.  Patient also reports a headache. Patient with history of anemia and gastric bypass.  PCP [name], MD, MD     Past Medical History  Diagnosis Date  . Migraines   . Interstitial cystitis   . IUD FEB 2010  . Gastritis JULY 2011  . Depression   . 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 Fatty liver   . Obesity (BMI 30-39.9) 2011 228 LBS BMI 36.8  . Iron deficiency anemia 07/23/2010  . Irritable bowel syndrome 2012 DIARRHEA    JUN 2012 TTG IgA 14.9  . Elevated liver enzymes JUL 2011ALK PHOS 111-127 AST  143-267 ALT  213-321T BILI 0.6  ALB  3.7-4.06 Jun 2011 ALK PHOS 118 AST 24 ALT 42 T BILI 0.4 ALB 3.9    Past Surgical History  Procedure Date  . Gab 2007    in [place]-POUCH 5 CM  . Cholecystectomy 2005    biliary dyskinesia  . Cath self every nite     for sodium bicarb injection  . Colonoscopy JUN 2012 ABD  PN/DIARRHEA WITH PROPOFOL    NL COLON  . Upper gastrointestinal endoscopy JULY 2011 NAUSEA-D125,V6, PH 25    Bx; GASTRITIS, POUCH-5 CM LONG    Family History  Problem Relation Age of Onset  . Hemochromatosis Maternal Grandmother   . Migraines Maternal Grandmother   . Hypertension Father   . Diabetes Father   . Migraines Paternal Grandmother     History  Substance Use Topics  . Smoking status: Current Everyday Smoker -- 0.5 packs/day    Types: Cigarettes  . Smokeless tobacco: Not on file  . Alcohol Use: No    OB History    Grav Para Term Preterm Abortions TAB SAB Ect Mult Living   1 1 1       1       Review of Systems  Constitutional: Negative for fatigue.  HENT: Negative for congestion, sinus pressure and ear discharge.   Eyes: Negative for discharge.  Respiratory: Negative for cough.   Cardiovascular: Negative for chest pain.  Gastrointestinal: Negative for abdominal pain and diarrhea.  Genitourinary: Positive for dysuria. Negative for frequency and hematuria.  Musculoskeletal: Positive for back pain.  Skin: Negative for rash.  Neurological: Positive for headaches. Negative for seizures.  Hematological: Negative.   Psychiatric/Behavioral: Negative for hallucinations.    Allergies  Codeine; Gabapentin; Latex; and Reglan  Home Medications   Current Outpatient Rx  Name Route Sig Dispense Refill  . ACETAMINOPHEN 500 MG PO TABS Oral Take 500 mg by mouth every 6 (six) hours as needed.      Marland Kitchen PAMPRIN MAX PO Oral Take 1 tablet by mouth daily as needed. Menstrual Pain     . CLONAZEPAM 0.5 MG PO TABS Oral Take 0.5 mg by mouth 2 (two) times daily as needed. For anxiety    . FERUMOXYTOL INJECTION 510 MG/17 ML Intravenous Inject 510 mg into the vein as needed. Iron Levels     . HYDROCODONE-ACETAMINOPHEN 10-325 MG PO TABS Oral Take 1 tablet by mouth every 6 (six) hours as needed. For pain    . LEVONORGESTREL 20 MCG/24HR IU IUD Intrauterine 1 each by Intrauterine route once.       Marland Kitchen LORATADINE 10 MG PO TABS Oral Take 10 mg by mouth daily.      Marland Kitchen NORTRIPTYLINE HCL 25 MG PO CAPS Oral Take 25 mg by mouth 3 (three) times daily.      Marland Kitchen PHENAZOPYRIDINE HCL 200 MG PO TABS Oral Take 200 mg by mouth 3 (three) times daily as needed. bladder    . QUETIAPINE FUMARATE 50 MG PO TABS Oral Take 50 mg by mouth at bedtime.      Marland Kitchen CIPROFLOXACIN HCL 500 MG PO TABS Oral Take 1 tablet (500 mg total) by mouth every 12 (twelve) hours. 14 tablet 0  . ELETRIPTAN HYDROBROMIDE 40 MG PO TABS Oral One tablet by mouth as needed for migraine headache.  If the headache improves and then returns, dose may be repeated after 2 hours have elapsed since first dose (do not exceed 80 mg per day). may repeat in 2 hours if necessary     . OXYCODONE-ACETAMINOPHEN 5-325 MG PO TABS Oral Take 1 tablet by mouth every 4 (four) hours as needed for pain. 15 tablet 0  . PROMETHAZINE HCL 25 MG PO TABS Oral Take 1 tablet (25 mg total) by mouth every 6 (six) hours as needed for nausea. 15 tablet 0  . SODIUM BICARBONATE 4 % IV SOLN Intravenous Inject 5 mLs into the vein at bedtime. Sod. Bicarb. 5cc + Lidocaine 10cc +heparin 4cc injected thru urinary catheter for intersistal cystitis         BP 127/69  Pulse 103  Temp(Src) 98.3 F (36.8 C) (Oral)  Resp 17  Ht 5\' 6"  (1.676 m)  Wt 225 lb (102.059 kg)  BMI 36.32 kg/m2  SpO2 97%  Physical Exam  Constitutional: She is oriented to person, place, and time. She appears well-developed and well-nourished. No distress.  HENT:  Head: Normocephalic and atraumatic.  Eyes: Conjunctivae and EOM are normal. No scleral icterus.  Neck: Neck supple. No thyromegaly present.  Cardiovascular: Normal rate and regular rhythm.  Exam reveals no gallop and no friction rub.   No murmur heard. Pulmonary/Chest: Effort normal and breath sounds normal. No stridor. No respiratory distress. She has no wheezes. She has no rales. She exhibits no tenderness.  Abdominal: Soft. She exhibits no  distension. There is tenderness in the suprapubic area. There is no rebound.  Musculoskeletal: Normal range of motion. She exhibits tenderness. She exhibits no edema.       Bilateral low back tenderness   Lymphadenopathy:    She has no cervical adenopathy.  Neurological: She is alert and oriented to person, place, and time. Coordination normal.  Skin: No rash noted. No erythema.  Psychiatric: She has a normal mood and  affect. Her behavior is normal.    ED Course  Procedures (including critical care time)   DIAGNOSTIC STUDIES: Oxygen Saturation is 100% on room air, normal by my interpretation.    COORDINATION OF CARE:  12:30 PM  Physical exam complete.  Discussed UA results with patient.  Urine indicates an infection.  Will order antibiotics, antiemetic and pain control.  3:03 PM  Recheck.  Plan to discharge patient.  Patient agrees with plan.      Orders Placed This Encounter  Procedures  . Urine culture  . Urinalysis with microscopic  . Urine microscopic-add on  . CBC  . Differential  . Basic metabolic panel  . In and Out Cath     LABS / RADIOLOGY:   Labs Reviewed  URINALYSIS, ROUTINE W REFLEX MICROSCOPIC - Abnormal; Notable for the following:    Color, Urine RED (*) BIOCHEMICALS MAY BE AFFECTED BY COLOR   Appearance CLOUDY (*)    Glucose, UA 500 (*)    Bilirubin Urine MODERATE (*)    Ketones, ur 15 (*)    Protein, ur >300 (*)    Urobilinogen, UA >8.0 (*)    Nitrite POSITIVE (*)    Leukocytes, UA LARGE (*)    All other components within normal limits  URINE MICROSCOPIC-ADD ON - Abnormal; Notable for the following:    Squamous Epithelial / LPF FEW (*)    Bacteria, UA FEW (*)    All other components within normal limits  CBC - Abnormal; Notable for the following:    RBC 3.47 (*)    Hemoglobin 11.6 (*)    HCT 33.9 (*)    All other components within normal limits  DIFFERENTIAL  BASIC METABOLIC PANEL  URINE CULTURE   Results for orders placed during the  hospital encounter of 10/09/11  URINALYSIS, ROUTINE W REFLEX MICROSCOPIC      Component Value Range   Color, Urine RED (*) YELLOW    Appearance CLOUDY (*) CLEAR    Specific Gravity, Urine 1.025  1.005 - 1.030    pH 6.5  5.0 - 8.0    Glucose, UA 500 (*) NEGATIVE (mg/dL)   Hgb urine dipstick NEGATIVE  NEGATIVE    Bilirubin Urine MODERATE (*) NEGATIVE    Ketones, ur 15 (*) NEGATIVE (mg/dL)   Protein, ur >161 (*) NEGATIVE (mg/dL)   Urobilinogen, UA >0.9 (*) 0.0 - 1.0 (mg/dL)   Nitrite POSITIVE (*) NEGATIVE    Leukocytes, UA LARGE (*) NEGATIVE   URINE MICROSCOPIC-ADD ON      Component Value Range   Squamous Epithelial / LPF FEW (*) RARE    WBC, UA 21-50  <3 (WBC/hpf)   Bacteria, UA FEW (*) RARE   CBC      Component Value Range   WBC 9.0  4.0 - 10.5 (K/uL)   RBC 3.47 (*) 3.87 - 5.11 (MIL/uL)   Hemoglobin 11.6 (*) 12.0 - 15.0 (g/dL)   HCT 60.4 (*) 54.0 - 46.0 (%)   MCV 97.7  78.0 - 100.0 (fL)   MCH 33.4  26.0 - 34.0 (pg)   MCHC 34.2  30.0 - 36.0 (g/dL)   RDW 98.1  19.1 - 47.8 (%)   Platelets 183  150 - 400 (K/uL)  DIFFERENTIAL      Component Value Range   Neutrophils Relative 67  43 - 77 (%)   Neutro Abs 6.1  1.7 - 7.7 (K/uL)   Lymphocytes Relative 20  12 - 46 (%)   Lymphs Abs 1.8  0.7 - 4.0 (K/uL)   Monocytes Relative 11  3 - 12 (%)   Monocytes Absolute 1.0  0.1 - 1.0 (K/uL)   Eosinophils Relative 2  0 - 5 (%)   Eosinophils Absolute 0.1  0.0 - 0.7 (K/uL)   Basophils Relative 0  0 - 1 (%)   Basophils Absolute 0.0  0.0 - 0.1 (K/uL)  BASIC METABOLIC PANEL      Component Value Range   Sodium 135  135 - 145 (mEq/L)   Potassium 3.9  3.5 - 5.1 (mEq/L)   Chloride 100  96 - 112 (mEq/L)   CO2 29  19 - 32 (mEq/L)   Glucose, Bld 87  70 - 99 (mg/dL)   BUN 6  6 - 23 (mg/dL)   Creatinine, Ser 1.91  0.50 - 1.10 (mg/dL)   Calcium 9.4  8.4 - 47.8 (mg/dL)   GFR calc non Af Amer >90  >90 (mL/min)   GFR calc Af Amer >90  >90 (mL/min)     No results found.       MDM   MDM:  uti     MEDICATIONS GIVEN IN THE E.D. Scheduled Meds:    . HYDROmorphone  1 mg Intravenous Once  . morphine      . morphine  6 mg Intravenous Once  . ondansetron  4 mg Intravenous Once  . sodium chloride  1,000 mL Intravenous Once   Continuous Infusions:    . ciprofloxacin Stopped (10/09/11 1412)       IMPRESSION: 1. UTI (lower urinary tract infection)       The chart was scribed for me under my direct supervision.  I personally performed the history, physical, and medical decision making and all procedures in the evaluation of this patient.Benny Lennert, MD 10/09/11 [phone]

## 2011-10-11 LAB — URINE CULTURE

## 2011-10-13 DIAGNOSIS — N9489 Other specified conditions associated with female genital organs and menstrual cycle: Secondary | ICD-10-CM | POA: Insufficient documentation

## 2011-10-13 DIAGNOSIS — IMO0002 Reserved for concepts with insufficient information to code with codable children: Secondary | ICD-10-CM | POA: Insufficient documentation

## 2011-10-13 DIAGNOSIS — G43709 Chronic migraine without aura, not intractable, without status migrainosus: Secondary | ICD-10-CM | POA: Insufficient documentation

## 2011-10-13 DIAGNOSIS — N941 Unspecified dyspareunia: Secondary | ICD-10-CM | POA: Insufficient documentation

## 2011-10-25 ENCOUNTER — Emergency Department (HOSPITAL_COMMUNITY)
Admission: EM | Admit: 2011-10-25 | Discharge: 2011-10-25 | Disposition: A | Discharge status: Home / Self Care | Payer: Medicaid Other | Attending: Emergency Medicine | Admitting: Emergency Medicine

## 2011-10-25 ENCOUNTER — Encounter (HOSPITAL_COMMUNITY): Payer: Self-pay | Admitting: *Deleted

## 2011-10-25 DIAGNOSIS — Z87891 Personal history of nicotine dependence: Secondary | ICD-10-CM | POA: Insufficient documentation

## 2011-10-25 DIAGNOSIS — F3289 Other specified depressive episodes: Secondary | ICD-10-CM | POA: Insufficient documentation

## 2011-10-25 DIAGNOSIS — R51 Headache: Secondary | ICD-10-CM | POA: Insufficient documentation

## 2011-10-25 DIAGNOSIS — Z9884 Bariatric surgery status: Secondary | ICD-10-CM | POA: Insufficient documentation

## 2011-10-25 DIAGNOSIS — Z6836 Body mass index (BMI) 36.0-36.9, adult: Secondary | ICD-10-CM | POA: Insufficient documentation

## 2011-10-25 DIAGNOSIS — G43909 Migraine, unspecified, not intractable, without status migrainosus: Secondary | ICD-10-CM

## 2011-10-25 DIAGNOSIS — F329 Major depressive disorder, single episode, unspecified: Secondary | ICD-10-CM | POA: Insufficient documentation

## 2011-10-25 DIAGNOSIS — Z862 Personal history of diseases of the blood and blood-forming organs and certain disorders involving the immune mechanism: Secondary | ICD-10-CM | POA: Insufficient documentation

## 2011-10-25 DIAGNOSIS — E669 Obesity, unspecified: Secondary | ICD-10-CM | POA: Insufficient documentation

## 2011-10-25 DIAGNOSIS — K589 Irritable bowel syndrome without diarrhea: Secondary | ICD-10-CM | POA: Insufficient documentation

## 2011-10-25 HISTORY — DX: Headache: R51

## 2011-10-25 HISTORY — DX: Headache, unspecified: R51.9

## 2011-10-25 MED ORDER — HYDROMORPHONE HCL PF 2 MG/ML IJ SOLN
INTRAMUSCULAR | Status: AC
  Administered 2011-10-25: 2 mg via INTRAMUSCULAR
  Filled 2011-10-25: qty 1

## 2011-10-25 MED ORDER — HYDROMORPHONE HCL PF 2 MG/ML IJ SOLN: 2.0000 mg | Freq: Once | INTRAMUSCULAR | Status: DC

## 2011-10-25 MED ORDER — DIAZEPAM 5 MG PO TABS
5.0000 mg | ORAL_TABLET | Freq: Once | ORAL | Status: AC
  Administered 2011-10-25: 5 mg via ORAL
  Filled 2011-10-25: qty 1

## 2011-10-25 MED ORDER — DIPHENHYDRAMINE HCL 50 MG/ML IJ SOLN
50.0000 mg | Freq: Once | INTRAMUSCULAR | Status: AC
  Administered 2011-10-25: 50 mg via INTRAMUSCULAR
  Filled 2011-10-25: qty 1

## 2011-10-25 MED ORDER — DIAZEPAM 5 MG PO TABS: 5.0000 mg | ORAL_TABLET | Freq: Three times a day (TID) | ORAL | Status: AC | PRN

## 2011-10-25 MED ORDER — KETOROLAC TROMETHAMINE 60 MG/2ML IM SOLN
60.0000 mg | Freq: Once | INTRAMUSCULAR | Status: AC
Start: 2011-10-25 — End: 2011-10-25
  Administered 2011-10-25: 60 mg via INTRAMUSCULAR
  Filled 2011-10-25: qty 2

## 2011-10-25 MED ORDER — DEXAMETHASONE 4 MG PO TABS
4.0000 mg | ORAL_TABLET | Freq: Once | ORAL | Status: DC
  Filled 2011-10-25: qty 1

## 2011-10-25 MED ORDER — DEXAMETHASONE 6 MG PO TABS
10.0000 mg | ORAL_TABLET | Freq: Once | ORAL | Status: AC
  Administered 2011-10-25: 10 mg via ORAL
  Filled 2011-10-25: qty 1

## 2011-10-25 MED ORDER — HYDROMORPHONE HCL PF 2 MG/ML IJ SOLN
2.0000 mg | Freq: Once | INTRAMUSCULAR | Status: AC
  Administered 2011-10-25: 2 mg via INTRAMUSCULAR

## 2011-10-25 MED ORDER — DEXAMETHASONE 6 MG PO TABS: 10.0000 mg | ORAL_TABLET | Freq: Once | ORAL | Status: DC

## 2011-10-25 MED ORDER — DEXAMETHASONE 4 MG PO TABS
6.0000 mg | ORAL_TABLET | Freq: Once | ORAL | Status: DC
  Filled 2011-10-25: qty 1.5

## 2011-10-25 NOTE — ED Notes (Signed)
Pt c/o migraine headache since last night. Pt seen at [facility] neurology and treated there. Pt still c/o pain so was sent to the ED.

## 2011-10-25 NOTE — ED Provider Notes (Signed)
History     CSN: [id number] Arrival date & time: 10/25/2011  2:00 PM   Chief Complaint  Patient presents with  . Migraine    HPI Pt was seen at 1415.  Per pt, c/o gradual onset and persistence of constant acute flair of her chronic migraine headache since last night.  Describes the headache as per her usual chronic migraine headache pain pattern x10+ years.  Denies headache was sudden or maximal in onset or at any time.  Denies visual changes, no focal motor weakness, no tingling/numbness in extremities, no fevers, no neck pain, no rash.  States she was eval by her Neuro MD at [facility] in the office PTA, received Depakote infusion and phenergan without relief.  Pt states she has been admitted to the hospital previously for same headaches, received IV dilaudid.   Past Medical History  Diagnosis Date  . Migraines   . Interstitial cystitis   . IUD FEB 2010  . Gastritis JULY 2011  . Depression   . 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 Fatty liver   . Obesity (BMI 30-39.9) 2011 228 LBS BMI 36.8  . Iron deficiency anemia 07/23/2010  . Irritable bowel syndrome 2012 DIARRHEA    JUN 2012 TTG IgA 14.9  . Elevated liver enzymes JUL 2011ALK PHOS 111-127 AST  143-267 ALT  213-321T BILI 0.6  ALB  3.7-4.06 Jun 2011 ALK PHOS 118 AST 24 ALT 42 T BILI 0.4 ALB 3.9    Past Surgical History  Procedure Date  . Gab 2007    in [place]-POUCH 5 CM  . Cholecystectomy 2005    biliary dyskinesia  . Cath self every nite     for sodium bicarb injection  . Colonoscopy JUN 2012 ABD PN/DIARRHEA WITH PROPOFOL    NL COLON  . Upper gastrointestinal endoscopy JULY 2011 NAUSEA-D125,V6, PH 25    Bx; GASTRITIS, POUCH-5 CM LONG    Family History  Problem Relation Age of Onset  . Hemochromatosis Maternal Grandmother   . Migraines Maternal Grandmother   . Hypertension Father   . Diabetes Father   . Migraines Paternal Grandmother     History  Substance Use Topics  . Smoking status:  Former Smoker -- 0.5 packs/day    Types: Cigarettes  . Smokeless tobacco: Not on file  . Alcohol Use: No    OB History    Grav Para Term Preterm Abortions TAB SAB Ect Mult Living   1 1 1       1       Review of Systems ROS: Statement: All systems negative except as marked or noted in the HPI; Constitutional: Negative for fever and chills. ; ; Eyes: Negative for eye pain, redness and discharge. ; ; ENMT: Negative for ear pain, hoarseness, nasal congestion, sinus pressure and sore throat. ; ; Cardiovascular: Negative for chest pain, palpitations, diaphoresis, dyspnea and peripheral edema. ; ; Respiratory: Negative for cough, wheezing and stridor. ; ; Gastrointestinal:  +nausea. Negative for vomiting, diarrhea and abdominal pain, blood in stool, hematemesis, jaundice and rectal bleeding. . ; ; Genitourinary: Negative for dysuria, flank pain and hematuria. ; ; Musculoskeletal: Negative for back pain and neck pain. Negative for swelling and trauma.; ; Skin: Negative for pruritus, rash, abrasions, blisters, bruising and skin lesion.; ; Neuro: +headache.  Negative for lightheadedness and neck stiffness. Negative for weakness, altered level of consciousness , altered mental status, extremity weakness, paresthesias, involuntary movement, seizure and syncope.  Allergies  Codeine; Gabapentin; Latex; and Reglan  Home Medications   Current Outpatient Rx  Name Route Sig Dispense Refill  . PAMPRIN MAX PO Oral Take 1 tablet by mouth daily as needed. Menstrual Pain     . CLONAZEPAM 0.5 MG PO TABS Oral Take 0.5 mg by mouth 2 (two) times daily as needed. For anxiety    . ELETRIPTAN HYDROBROMIDE 40 MG PO TABS Oral One tablet by mouth as needed for migraine headache.  If the headache improves and then returns, dose may be repeated after 2 hours have elapsed since first dose (do not exceed 80 mg per day). may repeat in 2 hours if necessary     . FERUMOXYTOL INJECTION 510 MG/17 ML Intravenous Inject 510 mg into  the vein as needed. Iron Levels     . HYDROCODONE-ACETAMINOPHEN 10-325 MG PO TABS Oral Take 1 tablet by mouth every 6 (six) hours as needed. For pain    . LORATADINE 10 MG PO TABS Oral Take 10 mg by mouth daily.      Marland Kitchen NORTRIPTYLINE HCL 25 MG PO CAPS Oral Take 25 mg by mouth 3 (three) times daily.      Marland Kitchen PHENAZOPYRIDINE HCL 200 MG PO TABS Oral Take 200 mg by mouth 3 (three) times daily as needed. bladder    . QUETIAPINE FUMARATE 50 MG PO TABS Oral Take 50 mg by mouth at bedtime.      . SODIUM BICARBONATE 4 % IV SOLN Intravenous Inject 5 mLs into the vein at bedtime. Sod. Bicarb. 5cc + Lidocaine 10cc +heparin 4cc injected thru urinary catheter for intersistal cystitis       . LEVONORGESTREL 20 MCG/24HR IU IUD Intrauterine 1 each by Intrauterine route once.        BP 103/56  Pulse 96  Temp(Src) 97.9 F (36.6 C) (Oral)  Resp 20  Ht 5\' 6"  (1.676 m)  Wt 220 lb (99.791 kg)  BMI 35.51 kg/m2  SpO2 98%  Physical Exam 1420: Physical examination:  Nursing notes reviewed; Vital signs and O2 SAT reviewed;  Constitutional: Well developed, Well nourished, Well hydrated, Hiding her head under the covers on my exam. Head:  Normocephalic, atraumatic; Eyes: EOMI, PERRL, No scleral icterus; ENMT: TM's clear bilat.  Mouth and pharynx normal, Mucous membranes moist; Neck: Supple, Full range of motion, No lymphadenopathy, no meningeal signs; Cardiovascular: Regular rate and rhythm, No murmur, rub, or gallop; Respiratory: Breath sounds clear & equal bilaterally, No rales, rhonchi, wheezes, or rub, Normal respiratory effort/excursion; Chest: Nontender, Movement normal; Abdomen: Soft, Nontender, Nondistended, Normal bowel sounds; Genitourinary: No CVA tenderness; Extremities: Pulses normal, No tenderness, No edema, No calf edema or asymmetry.; Neuro: AA&Ox3, Major CN grossly intact. No facial droop, speech clear.  No gross focal motor or sensory deficits in extremities.; Skin: Color normal, Warm, Dry, no rash.     ED Course  Procedures   MDM  MDM Reviewed: nursing note, vitals and previous chart  1435:  T/C to Neuro Dr Thad Ranger, case discussed, including:  HPI, pertinent PM/SHx, VS/PE.  Agrees with pt statement that she received depakote infusion in the office (not decadron) as well as phenergan.  Agrees with tx migraine today per usual literature-based treatment, f/u ofc this week.  1700:  Pt sleeping, woken up and states she "still has a headache," though appears comfortable, no longer hiding under the bedcovers.  Endorses she has been under "a lot of stress" that "brings these headaches on."  Echart review from last admission 07/2011 by  Dr. Anne Hahn: "The patient has continued to complain of headaches throughout the hospitalization without benefit from the medication.  The patient is presumed to have a muscle contraction headache, not a migraine headache."  Pt states she only has 3 tabs of valium at home and requests another rx for same.  Will dose IM dilaudid and PO valium, d/c to f/u with PMD and Neuro MD.  Pt and family want to go home now and agreeable with plan.       Medications given in ED:  diazepam (VALIUM) tablet 5 mg (not administered)  ketorolac (TORADOL) injection 60 mg (60 mg Intramuscular Given 10/25/11 1458)  dexamethasone (DECADRON) tablet 10 mg (10 mg Oral Given 10/25/11 1532)  diphenhydrAMINE (BENADRYL) injection 50 mg (50 mg Intramuscular Given 10/25/11 1458)  HYDROmorphone (DILAUDID) injection 2 mg (2 mg Intramuscular Given 10/25/11 1715)      Nanna Ertle M      Laray Anger, DO 10/25/11 1953

## 2011-10-25 NOTE — ED Notes (Addendum)
C/o migraine HA pain onset last night; reports HA mostly right-sided with sensitivity to light; c/o nausea; reports was seen at [facility] this morning and rec'd Decadron infusions and phenergan; pt states that these did not help and was advised by neurologist to go to ED for further tx.

## 2011-10-27 ENCOUNTER — Other Ambulatory Visit (HOSPITAL_COMMUNITY): Payer: Medicaid Other

## 2011-11-09 ENCOUNTER — Encounter (HOSPITAL_COMMUNITY): Payer: Self-pay

## 2011-11-09 ENCOUNTER — Emergency Department (HOSPITAL_COMMUNITY)
Admission: EM | Admit: 2011-11-09 | Discharge: 2011-11-09 | Disposition: A | Discharge status: Home / Self Care | Payer: Medicaid Other | Attending: Emergency Medicine | Admitting: Emergency Medicine

## 2011-11-09 DIAGNOSIS — R5381 Other malaise: Secondary | ICD-10-CM | POA: Insufficient documentation

## 2011-11-09 DIAGNOSIS — Z862 Personal history of diseases of the blood and blood-forming organs and certain disorders involving the immune mechanism: Secondary | ICD-10-CM | POA: Insufficient documentation

## 2011-11-09 DIAGNOSIS — R5383 Other fatigue: Secondary | ICD-10-CM | POA: Insufficient documentation

## 2011-11-09 DIAGNOSIS — E669 Obesity, unspecified: Secondary | ICD-10-CM | POA: Insufficient documentation

## 2011-11-09 DIAGNOSIS — IMO0001 Reserved for inherently not codable concepts without codable children: Secondary | ICD-10-CM | POA: Insufficient documentation

## 2011-11-09 DIAGNOSIS — G43909 Migraine, unspecified, not intractable, without status migrainosus: Secondary | ICD-10-CM

## 2011-11-09 DIAGNOSIS — Z9884 Bariatric surgery status: Secondary | ICD-10-CM | POA: Insufficient documentation

## 2011-11-09 DIAGNOSIS — Z6830 Body mass index (BMI) 30.0-30.9, adult: Secondary | ICD-10-CM | POA: Insufficient documentation

## 2011-11-09 DIAGNOSIS — K589 Irritable bowel syndrome without diarrhea: Secondary | ICD-10-CM | POA: Insufficient documentation

## 2011-11-09 DIAGNOSIS — Z975 Presence of (intrauterine) contraceptive device: Secondary | ICD-10-CM | POA: Insufficient documentation

## 2011-11-09 DIAGNOSIS — F3289 Other specified depressive episodes: Secondary | ICD-10-CM | POA: Insufficient documentation

## 2011-11-09 DIAGNOSIS — Z87891 Personal history of nicotine dependence: Secondary | ICD-10-CM | POA: Insufficient documentation

## 2011-11-09 DIAGNOSIS — F329 Major depressive disorder, single episode, unspecified: Secondary | ICD-10-CM | POA: Insufficient documentation

## 2011-11-09 MED ORDER — DIPHENHYDRAMINE HCL 50 MG/ML IJ SOLN
50.0000 mg | Freq: Once | INTRAMUSCULAR | Status: AC
  Administered 2011-11-09: 50 mg via INTRAMUSCULAR
  Filled 2011-11-09: qty 1

## 2011-11-09 MED ORDER — PROMETHAZINE HCL 25 MG/ML IJ SOLN
25.0000 mg | Freq: Once | INTRAMUSCULAR | Status: AC
  Administered 2011-11-09: 25 mg via INTRAMUSCULAR
  Filled 2011-11-09: qty 1

## 2011-11-09 MED ORDER — KETOROLAC TROMETHAMINE 60 MG/2ML IM SOLN
60.0000 mg | Freq: Once | INTRAMUSCULAR | Status: AC
  Administered 2011-11-09: 60 mg via INTRAMUSCULAR
  Filled 2011-11-09: qty 2

## 2011-11-09 MED ORDER — PROMETHAZINE HCL 25 MG PO TABS: 25.0000 mg | ORAL_TABLET | Freq: Four times a day (QID) | ORAL | Status: DC | PRN

## 2011-11-09 NOTE — ED Notes (Signed)
In to discharge pt. States she was given valium and dilaudid last time she was here. Pt made aware she would not be getting these meds today.

## 2011-11-09 NOTE — ED Notes (Signed)
Complain of migraine for three days. Cough for two days

## 2011-11-09 NOTE — ED Provider Notes (Signed)
History     CSN: [id number] Arrival date & time: 11/09/2011  1:03 PM   Chief Complaint  Patient presents with  . Migraine   HPI Pt was seen at 1325.  Per pt, c/o gradual onset and persistence of constant acute flair of her chronic migraine headache x3 days.  Describes the headache as per her usual chronic migraine headache pain pattern x10+ years.  Pt has hx of previous evals for same, and has been eval in ED multiple times for same.  Denies headache was sudden or maximal in onset or at any time.  Denies visual changes, no focal motor weakness, no tingling/numbness in extremities, no fevers, no neck pain, no rash.  Pt states she has her Neuro MD appt in "another few weeks."  Pt also c/o gradual onset and persistence of constant generalized body aches/fatgue and cough since yesterday.  Denies N/V/V, no abd pain.   Past Medical History  Diagnosis Date  . Migraines   . Interstitial cystitis   . IUD FEB 2010  . Gastritis JULY 2011  . Depression   . 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 Fatty liver   . Obesity (BMI 30-39.9) 2011 228 LBS BMI 36.8  . Iron deficiency anemia 07/23/2010  . Irritable bowel syndrome 2012 DIARRHEA    JUN 2012 TTG IgA 14.9  . Elevated liver enzymes JUL 2011ALK PHOS 111-127 AST  143-267 ALT  213-321T BILI 0.6  ALB  3.7-4.06 Jun 2011 ALK PHOS 118 AST 24 ALT 42 T BILI 0.4 ALB 3.9  . Chronic daily headache     Past Surgical History  Procedure Date  . Gab 2007    in [place]-POUCH 5 CM  . Cholecystectomy 2005    biliary dyskinesia  . Cath self every nite     for sodium bicarb injection  . Colonoscopy JUN 2012 ABD PN/DIARRHEA WITH PROPOFOL    NL COLON  . Upper gastrointestinal endoscopy JULY 2011 NAUSEA-D125,V6, PH 25    Bx; GASTRITIS, POUCH-5 CM LONG    Family History  Problem Relation Age of Onset  . Hemochromatosis Maternal Grandmother   . Migraines Maternal Grandmother   . Hypertension Father   . Diabetes Father   . Migraines Paternal  Grandmother     History  Substance Use Topics  . Smoking status: Former Smoker -- 0.5 packs/day    Types: Cigarettes  . Smokeless tobacco: Not on file  . Alcohol Use: No    OB History    Grav Para Term Preterm Abortions TAB SAB Ect Mult Living   1 1 1       1       Review of Systems ROS: Statement: All systems negative except as marked or noted in the HPI; Constitutional: Negative for fever and chills. +body aches/fatigue ; ; Eyes: Negative for eye pain, redness and discharge. ; ; ENMT: Negative for ear pain, hoarseness, nasal congestion, sinus pressure and sore throat. ; ; Cardiovascular: Negative for chest pain, palpitations, diaphoresis, dyspnea and peripheral edema. ; ; Respiratory: +cough.  Negative for wheezing and stridor. ; ; Gastrointestinal: +nausea. Negative for vomiting, diarrhea, abdominal pain, blood in stool, hematemesis, jaundice and rectal bleeding. . ; ; Genitourinary: Negative for dysuria, flank pain and hematuria. ; ; Musculoskeletal: Negative for back pain and neck pain. Negative for swelling and trauma.; ; Skin: Negative for pruritus, rash, abrasions, blisters, bruising and skin lesion.; ; Neuro: +headache.  Negative for lightheadedness and neck  stiffness. Negative for weakness, altered level of consciousness , altered mental status, extremity weakness, paresthesias, involuntary movement, seizure and syncope.     Allergies  Codeine; Gabapentin; Latex; and Reglan  Home Medications   Current Outpatient Rx  Name Route Sig Dispense Refill  . CLONAZEPAM 1 MG PO TABS Oral Take 1 mg by mouth 2 (two) times daily as needed. Anxiety     . ELETRIPTAN HYDROBROMIDE 40 MG PO TABS Oral One tablet by mouth as needed for migraine headache.  If the headache improves and then returns, dose may be repeated after 2 hours have elapsed since first dose (do not exceed 80 mg per day). may repeat in 2 hours if necessary     . HYDROCODONE-ACETAMINOPHEN 10-325 MG PO TABS Oral Take 1 tablet by  mouth every 6 (six) hours as needed. For pain    . LORATADINE 10 MG PO TABS Oral Take 10 mg by mouth daily.      Marland Kitchen PHENAZOPYRIDINE HCL 200 MG PO TABS Oral Take 200 mg by mouth 3 (three) times daily as needed. bladder    . QUETIAPINE FUMARATE 50 MG PO TABS Oral Take 50 mg by mouth at bedtime.      . SODIUM BICARBONATE 4 % IV SOLN Intravenous Inject 5 mLs into the vein at bedtime. Sod. Bicarb. 5cc + Lidocaine 10cc +heparin 4cc injected thru urinary catheter for intersistal cystitis       . PAMPRIN MAX PO Oral Take 1 tablet by mouth daily as needed. Menstrual Pain     . FERUMOXYTOL INJECTION 510 MG/17 ML Intravenous Inject 510 mg into the vein as needed. Iron Levels     . LEVONORGESTREL 20 MCG/24HR IU IUD Intrauterine 1 each by Intrauterine route once.        BP 142/83  Pulse 116  Temp(Src) 98.3 F (36.8 C) (Oral)  Resp 18  Ht 5\' 6"  (1.676 m)  Wt 224 lb (101.606 kg)  BMI 36.15 kg/m2  SpO2 100%  Physical Exam 1330: Physical examination:  Nursing notes reviewed; Vital signs and O2 SAT reviewed;  Constitutional: Well developed, Well nourished, Well hydrated, In no acute distress; Head:  Normocephalic, atraumatic; Eyes: EOMI, PERRL, No scleral icterus; ENMT: Mouth and pharynx normal, Mucous membranes moist; Neck: Supple, Full range of motion, No lymphadenopathy; Cardiovascular: Regular rate and rhythm, No murmur, rub, or gallop; Respiratory: Breath sounds clear & equal bilaterally, No rales, rhonchi, wheezes, or rub, Normal respiratory effort/excursion; Chest: Nontender, Movement normal; Abdomen: Soft, Nontender, Nondistended, Normal bowel sounds; Extremities: Pulses normal, No tenderness, No edema, No calf edema or asymmetry.; Neuro: AA&Ox3, Major CN grossly intact.  No gross focal motor or sensory deficits in extremities.; Skin: Color normal, Warm, Dry, no rash, no petechiae.     ED Course  Procedures     MDM  MDM Reviewed: previous chart, nursing note and vitals   Pt with acute  flair of her chronic migraine headache; long hx of same.  Echart review from last admission 07/2011 by Dr. Anne Hahn: "The patient has continued to complain of headaches throughout the hospitalization without benefit from the medication (dilaudid). The patient is presumed to have a muscle contraction headache, not a migraine headache."  Pt made aware I will be treating her pain with literature based treatment for migraine headache, which does NOT include narcotics.  Pt verb understanding.  States she has a ride home.  Also requesting "pain meds" for her "body pains."  Cough and body aches/fatigue likely viral illness at this time.  No fever or hypoxia today.  No cough during exam. Lungs CTA bilat.  Will tx symptomatically for now, and pt again made aware that I will not be giving her narcotics today.  Pt verb understanding.    Medications given in ED:   ketorolac (TORADOL) injection 60 mg (not administered)  promethazine (PHENERGAN) injection 25 mg (not administered)  diphenhydrAMINE (BENADRYL) injection 50 mg (not administered)    Pt apparently told ED RN that she wanted valium and dilaudid for her headache.  It was reiterated that she would not be receiving narcotics for her chronic headache.     Berda Shelvin Allison Quarry, DO 11/11/11 1233

## 2011-11-24 ENCOUNTER — Encounter (HOSPITAL_COMMUNITY): Payer: Medicaid Other | Attending: Oncology

## 2011-11-24 DIAGNOSIS — D509 Iron deficiency anemia, unspecified: Secondary | ICD-10-CM | POA: Insufficient documentation

## 2011-11-24 LAB — CBC
HCT: 36.4 % (ref 36.0–46.0)
Hemoglobin: 12.5 g/dL (ref 12.0–15.0)
MCHC: 34.3 g/dL (ref 30.0–36.0)
RBC: 3.76 MIL/uL — ABNORMAL LOW (ref 3.87–5.11)
WBC: 7.5 10*3/uL (ref 4.0–10.5)

## 2011-11-24 NOTE — Progress Notes (Signed)
Penny [name] presented for labwork. Labs per MD order drawn via Peripheral Line 23 gauge needle inserted in right arm  Good blood return present. Procedure without incident.  Needle removed intact. Patient tolerated procedure well.

## 2011-11-25 ENCOUNTER — Other Ambulatory Visit (HOSPITAL_COMMUNITY): Payer: Self-pay | Admitting: Oncology

## 2011-11-25 ENCOUNTER — Telehealth (HOSPITAL_COMMUNITY): Payer: Self-pay | Admitting: *Deleted

## 2011-11-25 DIAGNOSIS — D509 Iron deficiency anemia, unspecified: Secondary | ICD-10-CM

## 2011-11-25 LAB — FERRITIN: Ferritin: 49 ng/mL (ref 10–291)

## 2011-11-25 NOTE — Telephone Encounter (Signed)
Message copied by Dennie Maizes on Thu Nov 25, 2011  1:12 PM ------      Message from: Ellouise Newer III      Created: Thu Nov 25, 2011 12:58 PM       Lab work in 1 month            Iron is good.

## 2011-11-25 NOTE — Telephone Encounter (Signed)
Spoke with pt as below, verbalizes understanding.

## 2011-12-22 ENCOUNTER — Encounter (HOSPITAL_COMMUNITY): Payer: Medicaid Other | Attending: Oncology

## 2011-12-22 DIAGNOSIS — D509 Iron deficiency anemia, unspecified: Secondary | ICD-10-CM

## 2011-12-22 LAB — IRON AND TIBC
Saturation Ratios: 47 % (ref 20–55)
TIBC: 304 ug/dL (ref 250–470)
UIBC: 160 ug/dL (ref 125–400)

## 2011-12-22 LAB — CBC
HCT: 39.6 % (ref 36.0–46.0)
Hemoglobin: 13.8 g/dL (ref 12.0–15.0)
MCH: 32.8 pg (ref 26.0–34.0)
MCHC: 34.8 g/dL (ref 30.0–36.0)
MCV: 94.1 fL (ref 78.0–100.0)
Platelets: 203 10*3/uL (ref 150–400)
RBC: 4.21 MIL/uL (ref 3.87–5.11)
RDW: 12.1 % (ref 11.5–15.5)
WBC: 5.4 10*3/uL (ref 4.0–10.5)

## 2011-12-22 NOTE — Progress Notes (Signed)
Labs drawn today for cbc,ferr,Iron and IBC 

## 2011-12-27 ENCOUNTER — Other Ambulatory Visit (HOSPITAL_COMMUNITY): Payer: Medicaid Other

## 2011-12-27 ENCOUNTER — Emergency Department (HOSPITAL_COMMUNITY)
Admission: EM | Admit: 2011-12-27 | Discharge: 2011-12-27 | Disposition: A | Discharge status: Home / Self Care | Payer: Medicaid Other | Attending: Emergency Medicine | Admitting: Emergency Medicine

## 2011-12-27 ENCOUNTER — Encounter (HOSPITAL_COMMUNITY): Payer: Self-pay | Admitting: *Deleted

## 2011-12-27 DIAGNOSIS — F329 Major depressive disorder, single episode, unspecified: Secondary | ICD-10-CM | POA: Insufficient documentation

## 2011-12-27 DIAGNOSIS — Z975 Presence of (intrauterine) contraceptive device: Secondary | ICD-10-CM | POA: Insufficient documentation

## 2011-12-27 DIAGNOSIS — F3289 Other specified depressive episodes: Secondary | ICD-10-CM | POA: Insufficient documentation

## 2011-12-27 DIAGNOSIS — Z9884 Bariatric surgery status: Secondary | ICD-10-CM | POA: Insufficient documentation

## 2011-12-27 DIAGNOSIS — K589 Irritable bowel syndrome without diarrhea: Secondary | ICD-10-CM | POA: Insufficient documentation

## 2011-12-27 DIAGNOSIS — Z87891 Personal history of nicotine dependence: Secondary | ICD-10-CM | POA: Insufficient documentation

## 2011-12-27 DIAGNOSIS — N3010 Interstitial cystitis (chronic) without hematuria: Secondary | ICD-10-CM

## 2011-12-27 DIAGNOSIS — Z9889 Other specified postprocedural states: Secondary | ICD-10-CM | POA: Insufficient documentation

## 2011-12-27 DIAGNOSIS — D649 Anemia, unspecified: Secondary | ICD-10-CM | POA: Insufficient documentation

## 2011-12-27 DIAGNOSIS — G43909 Migraine, unspecified, not intractable, without status migrainosus: Secondary | ICD-10-CM | POA: Insufficient documentation

## 2011-12-27 IMAGING — CT CT HEAD W/O CM
1 series · 16 of 30 positions shown, 20 images · non-contrast
Comparison: None available.

CLINICAL DATA: Headache.  Nausea.  Vomiting.

CT HEAD WITHOUT CONTRAST
TECHNIQUE: Contiguous axial images were obtained from the base of
the skull through the vertex without contrast.

[Series 2: headseq 4.8 h37s · axial · 0.43mm/px · z∈[+1368,+1497]mm · 16 of 30 slices shown, 20 images]
[im 2/30  brain]
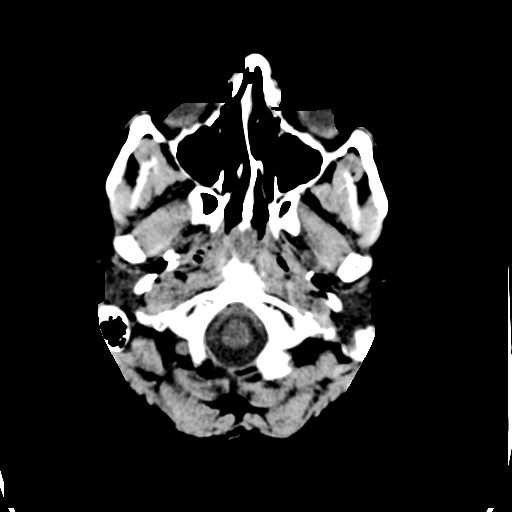
[im 2/30  bone]
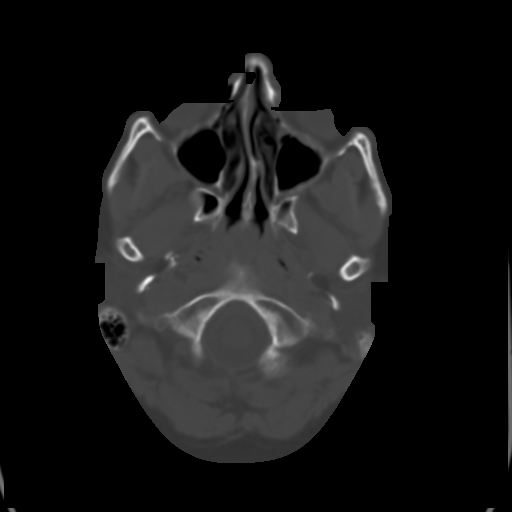
[im 4/30  brain]
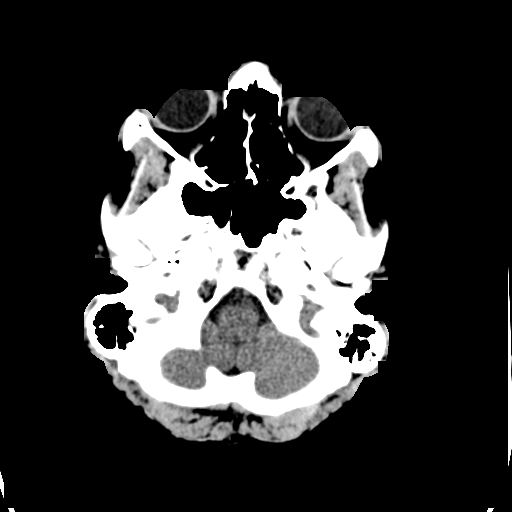
[im 6/30  brain]
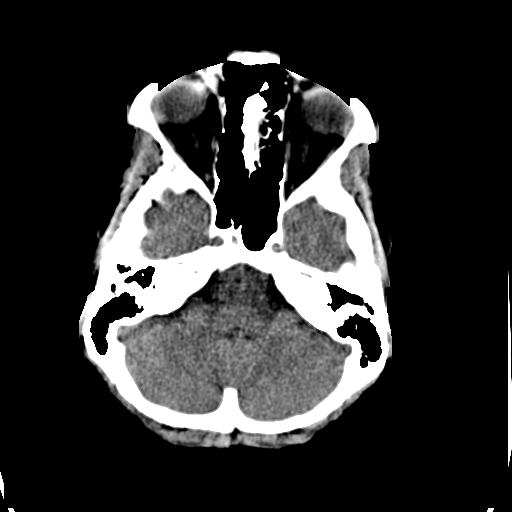
[im 8/30  brain]
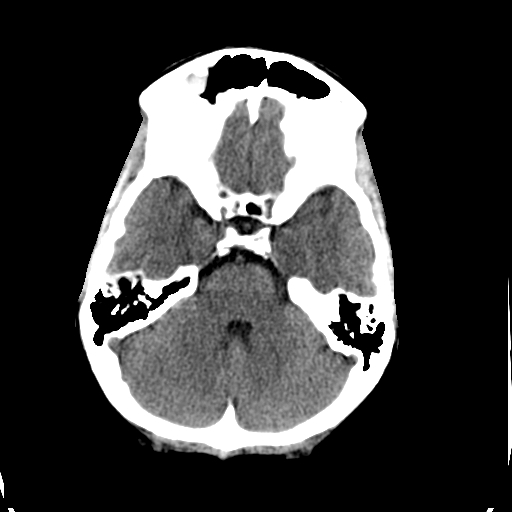
[im 9/30  brain]
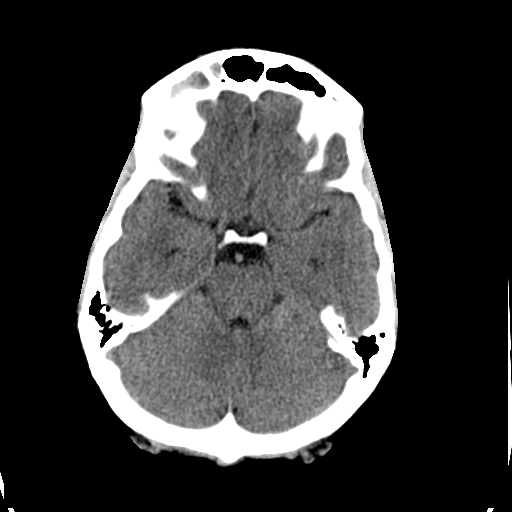
[im 9/30  bone]
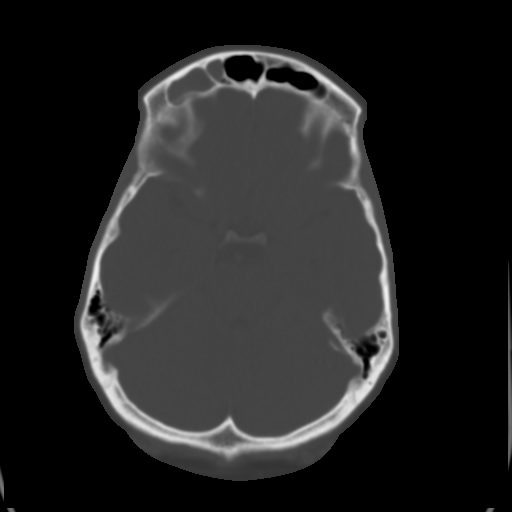
[im 11/30  brain]
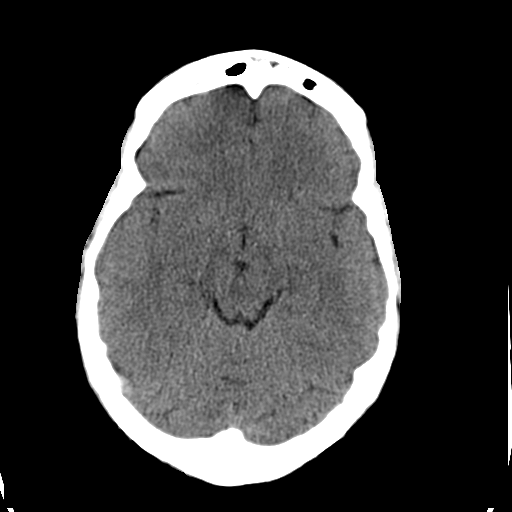
[im 13/30  brain]
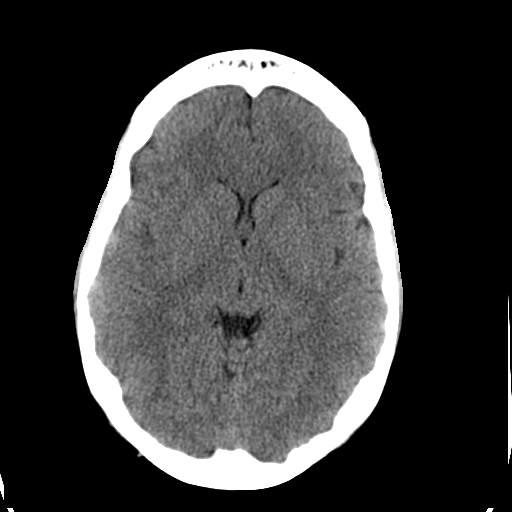
[im 15/30  brain]
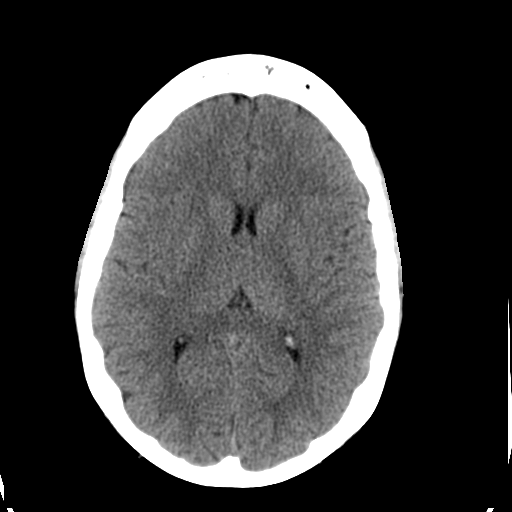
[im 16/30  brain]
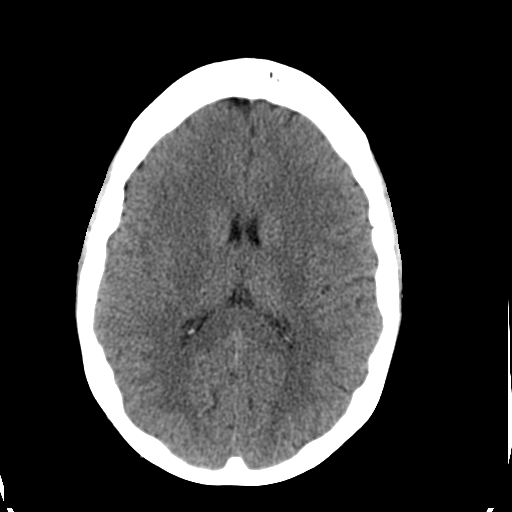
[im 16/30  bone]
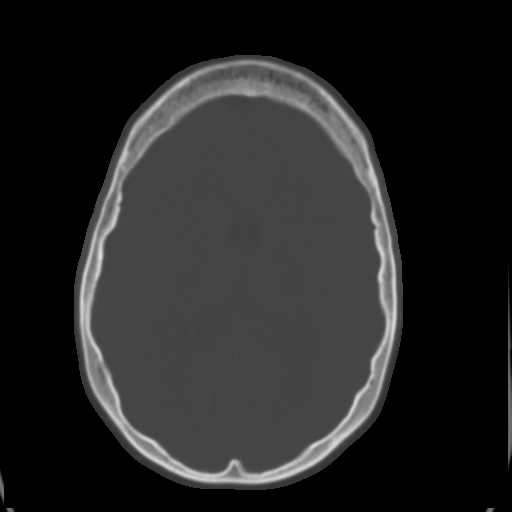
[im 18/30  brain]
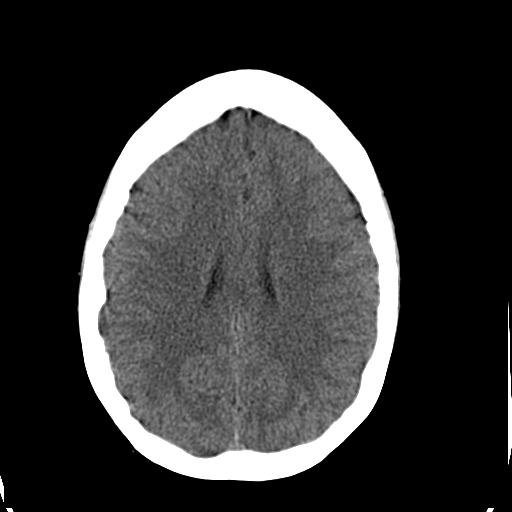
[im 20/30  brain]
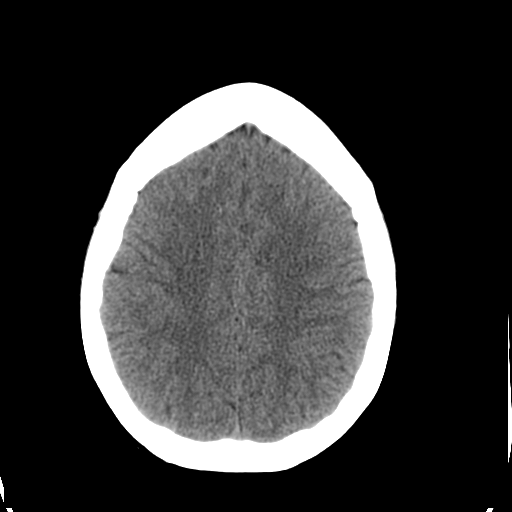
[im 22/30  brain]
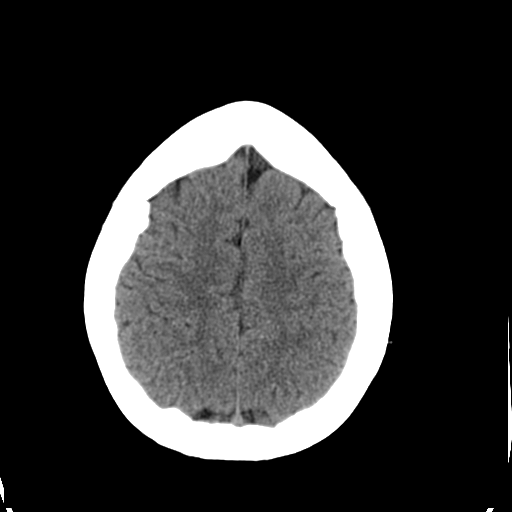
[im 23/30  brain]
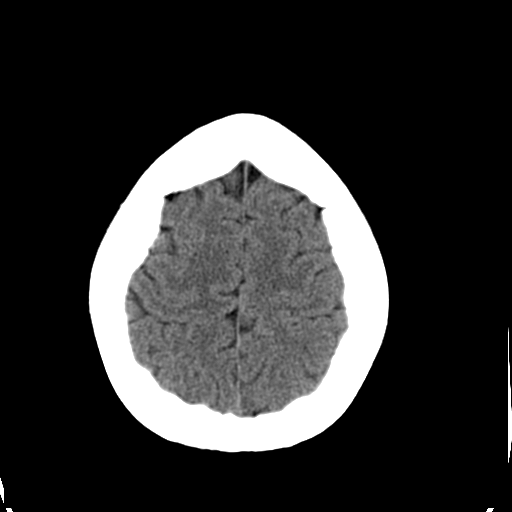
[im 23/30  bone]
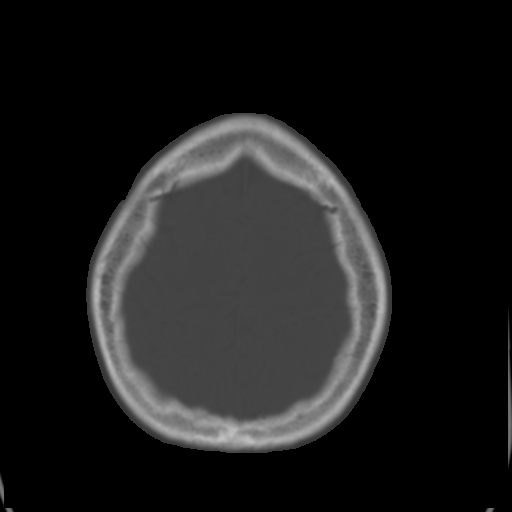
[im 25/30  brain]
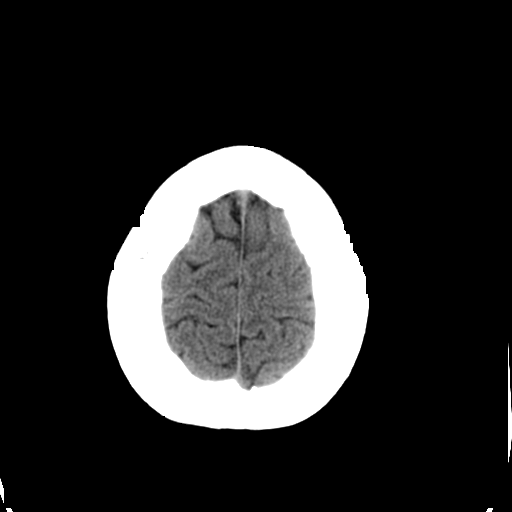
[im 27/30  brain]
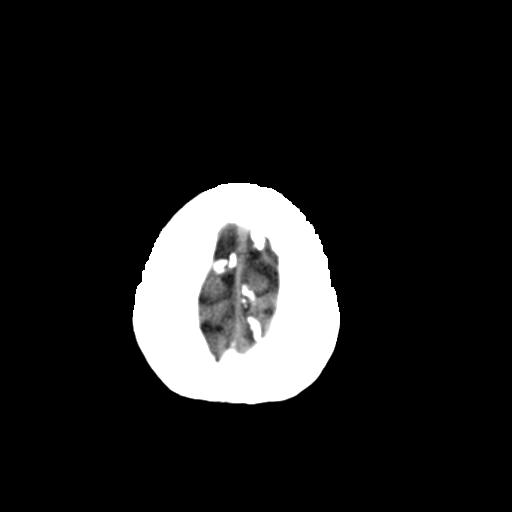
[im 29/30  brain]
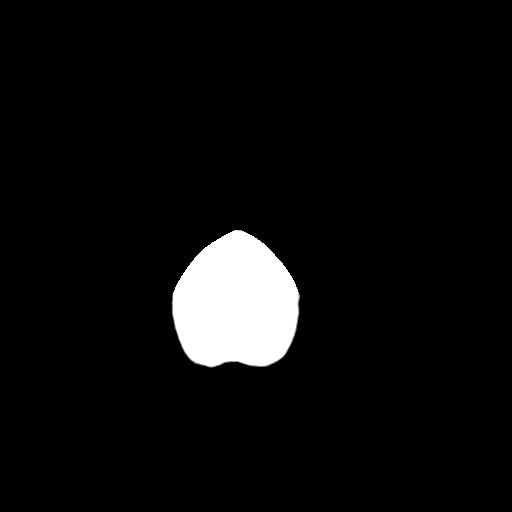

[16 of 30 positions shown; findings below may reference images not displayed]

FINDINGS: No mass lesion, mass effect, midline shift,
hydrocephalus, hemorrhage.  No territorial ischemia or acute
infarction. Paranasal sinuses show mucous retention cyst or polyp
within the lateral right frontal sinus.  Otherwise the paranasal
sinuses are clear.  Mastoid air cells clear.
IMPRESSION: 1.  Normal brain CT.
2.  Chronic right frontal sinus disease.

## 2011-12-27 IMAGING — RF DG FLUORO GUIDE NDL PLC/BX
2 series · 2 of 2 positions shown · non-contrast
Comparison: none

CLINICAL DATA: Headache, nausea

FLUOROSCOPIC GUIDED LUMBAR PUNCTURE:
TECHNIQUE: Written informed consent for fluoroscopic-guided lumbar puncture
was obtained.
Patient was placed prone.
L4-L5 disc space was localized under fluoroscopy.
Skin prepped and draped in usual sterile fashion.
Skin and soft tissues anesthetized with 2.0 ml of 1% lidocaine.
22 gauge spinal needle advanced into spinal canal.
Clear colorless CSF was encountered with opening pressure of
cm H2O.
Approximately 4.5 ml of CSF was obtained in three tubes for
requested laboratory analysis.
Procedure tolerated well by patient without immediate complication.
Fluoroscopy time: 0.6 minutes

[Series 1: run · 1 of 1 slices shown (1 of 2)]
[im 1/1]
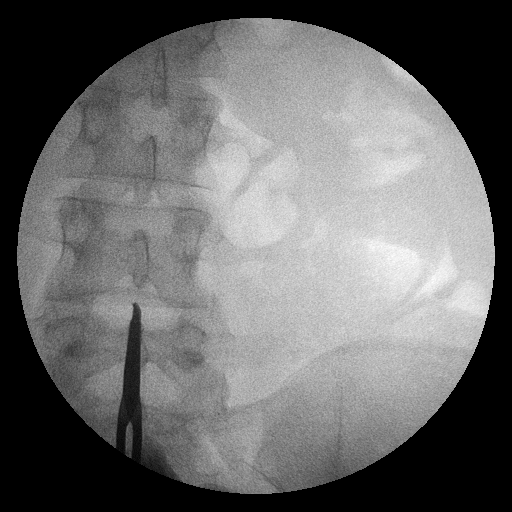

[Series 2: run · 1 of 1 slices shown (2 of 2)]
[im 1/1]
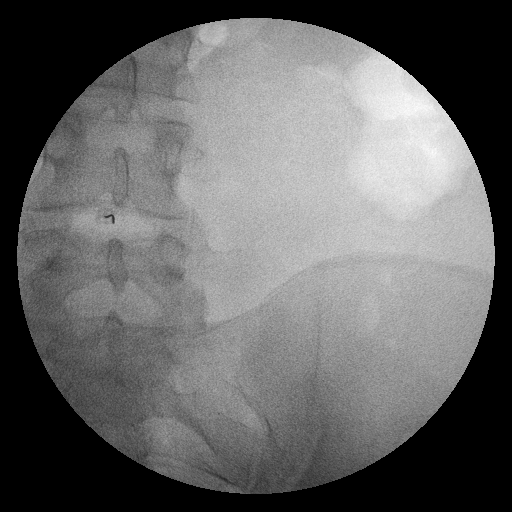

[2 of 2 positions shown; findings below may reference images not displayed]

IMPRESSION: Fluoroscopic guided lumbar puncture as above.

## 2011-12-27 MED ORDER — PROMETHAZINE HCL 25 MG/ML IJ SOLN
25.0000 mg | Freq: Once | INTRAMUSCULAR | Status: AC
  Administered 2011-12-27: 25 mg via INTRAVENOUS
  Filled 2011-12-27: qty 1

## 2011-12-27 MED ORDER — OXYCODONE-ACETAMINOPHEN 10-325 MG PO TABS: 1.0000 | ORAL_TABLET | ORAL | Status: DC | PRN

## 2011-12-27 MED ORDER — FENTANYL CITRATE 0.05 MG/ML IJ SOLN
100.0000 ug | Freq: Once | INTRAMUSCULAR | Status: AC
  Administered 2011-12-27: 100 ug via INTRAVENOUS
  Filled 2011-12-27: qty 2

## 2011-12-27 NOTE — ED Notes (Signed)
Pain in bladder and vagina , onset 330 pm.  Had tx for interstitial cystitis today

## 2011-12-27 NOTE — ED Notes (Signed)
2 unsuccessful IV attempts performed by this RN in right [facility] and right hand. 2 unsuccessful IV attempts performed by Steward Drone, RN in left hand and left AC.

## 2011-12-27 NOTE — ED Provider Notes (Signed)
History  This chart was scribed for [employer]. Rubin Payor, MD by Bennett Scrape. This patient was seen in room APA03/APA03 and the patient's care was started at 9:17PM.  CSN: [id number]  Arrival date & time 12/27/11  2012   First MD Initiated Contact with Patient 12/27/11 2113      Chief Complaint  Patient presents with  . Abdominal Pain    The history is provided by the patient and the spouse. No language interpreter was used.    Penny [name] is a 26 y.o. female with a h/o interstitial cystitis who presents to the Emergency Department complaining of 6 hours of abdominal pain that started after a DMSO treatment today for interstitial cystitis. She reports that urination aggravates the pain. She reports taking hydrocodone with no improvement in the pain. She states that the pain she is experiencing now is more severe from the pain she has experienced with interstitial cystitis flare ups. Pt states that she had DMSO treatment where she had to hold the device for 15 minutes last week with mild pain. Today she had to hold the DMSO device for 30 minutes. Per spouse, pt had a near syncope episode in the urology office and was in immediate pain after explosion of the DMSO device. Pt consulted with PCP and was advised to come to the ED due to these reasons. Per spouse, pt has had more flare ups the past 2 months than usual triggering the need for DMSO treatments. She is a former smoker and denies alcohol use.  Pt's PCP is Dr. Delbert Harness and the procedure was done by Dr. Despina Hidden.  Past Medical History  Diagnosis Date  . Migraines   . Interstitial cystitis   . IUD FEB 2010  . Gastritis JULY 2011  . Depression   . 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 Fatty liver   . Obesity (BMI 30-39.9) 2011 228 LBS BMI 36.8  . Iron deficiency anemia 07/23/2010  . Irritable bowel syndrome 2012 DIARRHEA    JUN 2012 TTG IgA 14.9  . Elevated liver enzymes JUL 2011ALK PHOS 111-127 AST  143-267 ALT  213-321T BILI  0.6  ALB  3.7-4.06 Jun 2011 ALK PHOS 118 AST 24 ALT 42 T BILI 0.4 ALB 3.9  . Chronic daily headache     Past Surgical History  Procedure Date  . Gab 2007    in [place]-POUCH 5 CM  . Cholecystectomy 2005    biliary dyskinesia  . Cath self every nite     for sodium bicarb injection  . Colonoscopy JUN 2012 ABD PN/DIARRHEA WITH PROPOFOL    NL COLON  . Upper gastrointestinal endoscopy JULY 2011 NAUSEA-D125,V6, PH 25    Bx; GASTRITIS, POUCH-5 CM LONG    Family History  Problem Relation Age of Onset  . Hemochromatosis Maternal Grandmother   . Migraines Maternal Grandmother   . Hypertension Father   . Diabetes Father   . Migraines Paternal Grandmother     History  Substance Use Topics  . Smoking status: Former Smoker -- 0.5 packs/day    Types: Cigarettes  . Smokeless tobacco: Not on file  . Alcohol Use: No    OB History    Grav Para Term Preterm Abortions TAB SAB Ect Mult Living   1 1 1       1       Review of Systems  Allergies  Codeine; Gabapentin; Latex; and Reglan  Home Medications  Current Outpatient Rx  Name Route Sig Dispense Refill  . BIOTIN PO Oral Take 1 tablet by mouth daily.    Marland Kitchen CLONAZEPAM 1 MG PO TABS Oral Take 1 mg by mouth 4 (four) times daily as needed. Anxiety    . DIAZEPAM 10 MG PO TABS Oral Take 10 mg by mouth daily.    Marland Kitchen ELETRIPTAN HYDROBROMIDE 40 MG PO TABS Oral One tablet by mouth as needed for migraine headache.  If the headache improves and then returns, dose may be repeated after 2 hours have elapsed since first dose (do not exceed 80 mg per day). may repeat in 2 hours if necessary     . FERUMOXYTOL INJECTION 510 MG/17 ML Intravenous Inject 510 mg into the vein every 6 (six) months. Iron Levels    . FLAVOXATE HCL 100 MG PO TABS Oral Take 100 mg by mouth 4 (four) times daily as needed.    Marland Kitchen HYDROCODONE-ACETAMINOPHEN 10-325 MG PO TABS Oral Take 1 tablet by mouth every 6 (six) hours as needed. For pain    . LORATADINE 10 MG PO TABS Oral  Take 10 mg by mouth at bedtime.     Marland Kitchen PROMETHAZINE HCL 25 MG PO TABS Oral Take 25 mg by mouth every 6 (six) hours as needed. For nausea and vomiting    . PAMPRIN MAX PO Oral Take 1 tablet by mouth daily as needed. Menstrual Pain     . LEVONORGESTREL 20 MCG/24HR IU IUD Intrauterine 1 each by Intrauterine route once.      . OXYCODONE-ACETAMINOPHEN 10-325 MG PO TABS Oral Take 1 tablet by mouth every 4 (four) hours as needed for pain. 20 tablet 0    Triage Vitals: BP 122/70  Pulse 96  Temp(Src) 97.4 F (36.3 C) (Oral)  Resp 20  Ht 5\' 6"  (1.676 m)  Wt 221 lb (100.245 kg)  BMI 35.67 kg/m2  SpO2 100%  Physical Exam  Nursing note and vitals reviewed. Constitutional: She is oriented to person, place, and time. She appears well-developed and well-nourished.       Pt is uncomfortable appearing  HENT:  Head: Normocephalic and atraumatic.  Eyes: Conjunctivae and EOM are normal.  Neck: Normal range of motion. Neck supple.  Cardiovascular: Normal rate and regular rhythm.   No murmur heard. Pulmonary/Chest: Effort normal and breath sounds normal. No respiratory distress.  Abdominal: Soft. There is tenderness (Suprapubic tenderness). There is no rebound and no guarding.  Musculoskeletal: Normal range of motion. She exhibits no edema.  Neurological: She is alert and oriented to person, place, and time. No cranial nerve deficit.  Skin: Skin is warm and dry. No rash noted.  Psychiatric: She has a normal mood and affect. Her behavior is normal.    ED Course  Procedures (including critical care time)  DIAGNOSTIC STUDIES: Oxygen Saturation is 100% on room air, normal by my interpretation.    COORDINATION OF CARE: 9:22PM-Discussed treatment plan with pt at bedside and pt agreed to plan.   Labs Reviewed - No data to display No results found.   1. Interstitial cystitis       MDM  Patient had DMSO infusion to her bladder for interstitial cystitis today. She's had increased pain. I  discussed the case with Dr. Despina Hidden, who performed the procedure. He recommended changing to Percocet and have her follow with the office. Pain is somewhat improved after treatment. She'll be discharged home      I personally performed the services described in this documentation, which  was scribed in my presence. The recorded information has been reviewed and considered.     Juliet Rude. Rubin Payor, MD 12/27/11 2253

## 2011-12-27 NOTE — ED Notes (Signed)
Pt reports that she was seen at [facility] today for treatment for her chronic bladder complications. Pt states that she had her bladder irrigated and she has been in pain since that time. Pt reports nausea and vomiting as well as burning when she urinates. Pt states the receptionist called her later today to check to see how she was doing and after speaking with her suggested that she go to the ER. Pt reports pain 10/10 in her bladder at this time.

## 2011-12-27 NOTE — ED Notes (Signed)
MD at bedside. 

## 2011-12-29 IMAGING — MR MR MRA HEAD W/O CM
1 series · 13 of 48 positions shown · non-contrast
Comparison: CT scan of brain of 01/07/2010

CLINICAL DATA: Headaches with right eye pain.  Evaluate for
aneurysm.

MRA HEAD WITHOUT CONTRAST
TECHNIQUE: Angiographic images of the Circle of Willis were
obtained using MRA technique without intravenous contrast.

[Series 2: MRA · axial · 0.7mm · 0.29mm/px · z∈[-60,+47]mm · 13 of 160 slices shown]
[im 1/160]
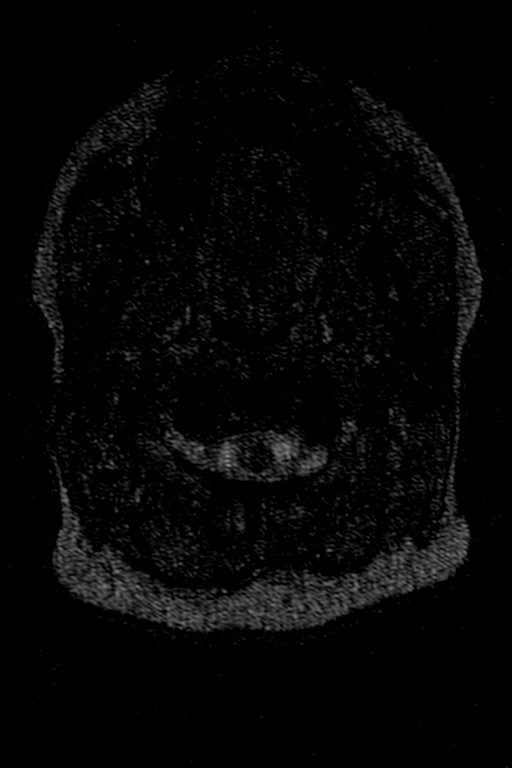
[im 4/160]
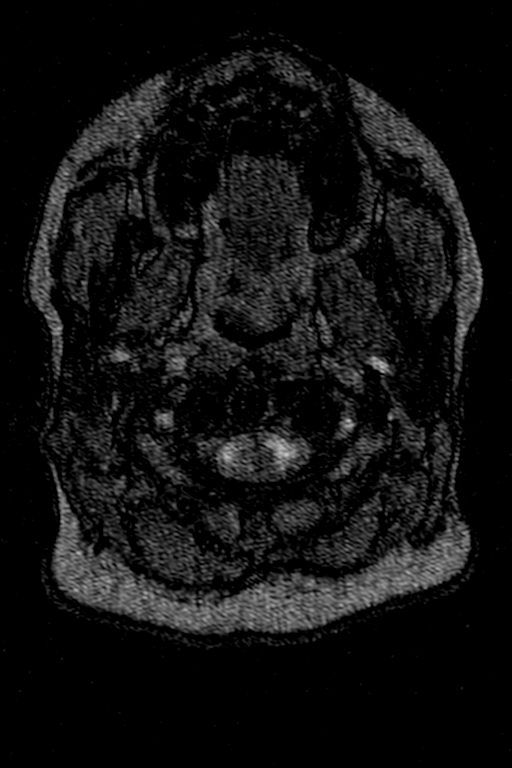
[im 11/160]
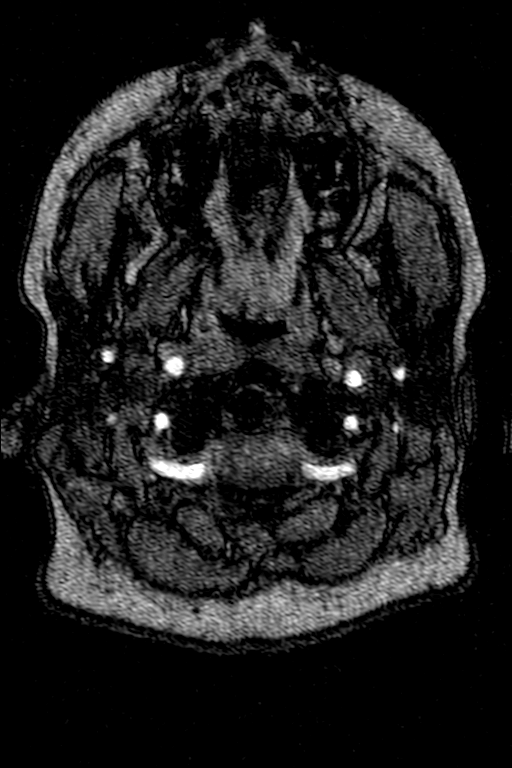
[im 28/160]
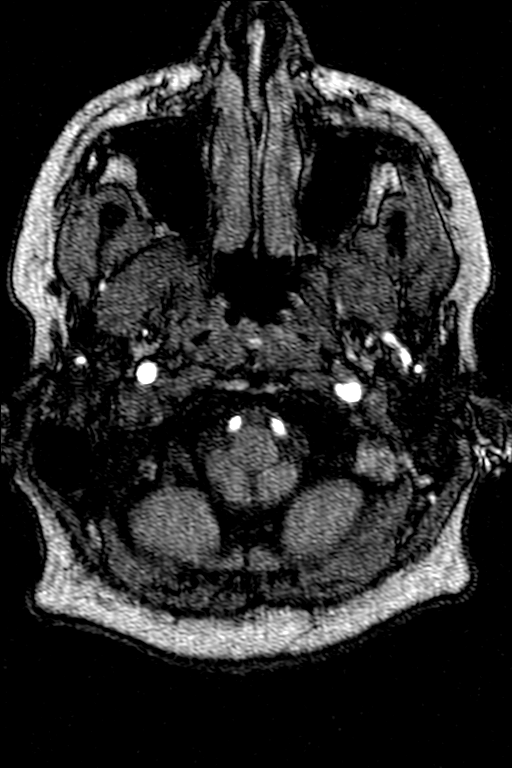
[im 31/160]
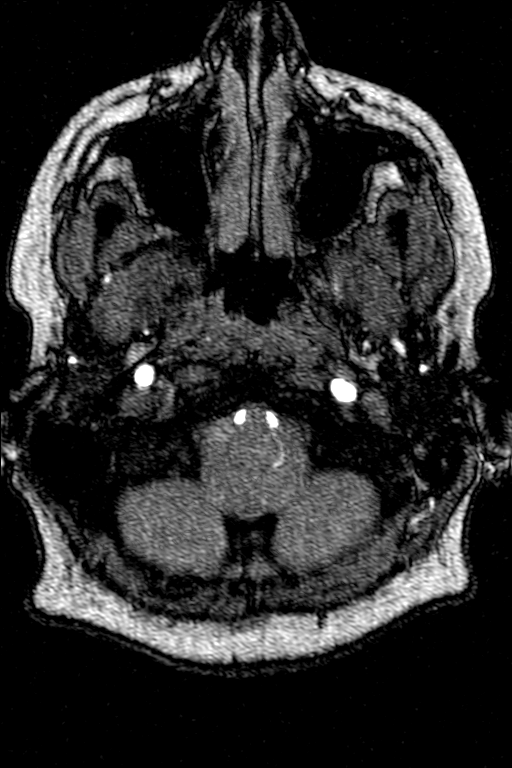
[im 51/160]
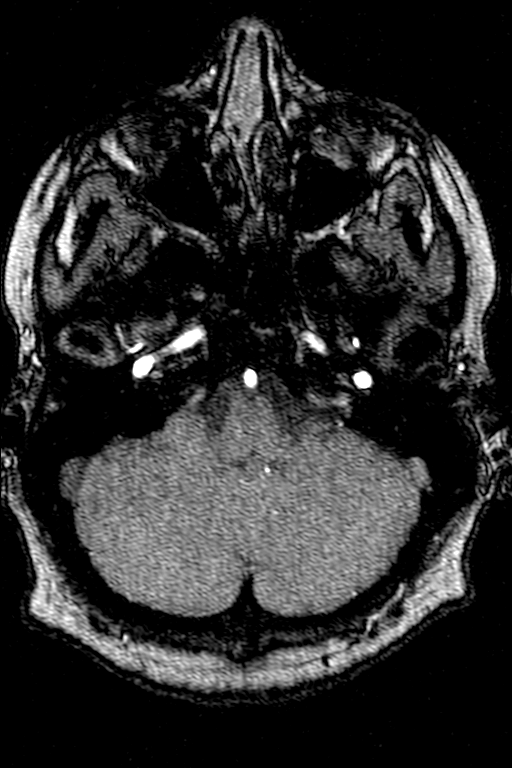
[im 72/160]
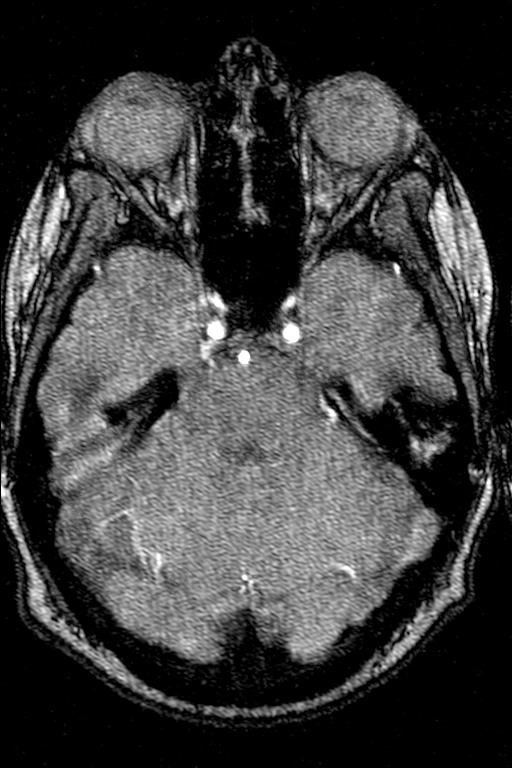
[im 82/160]
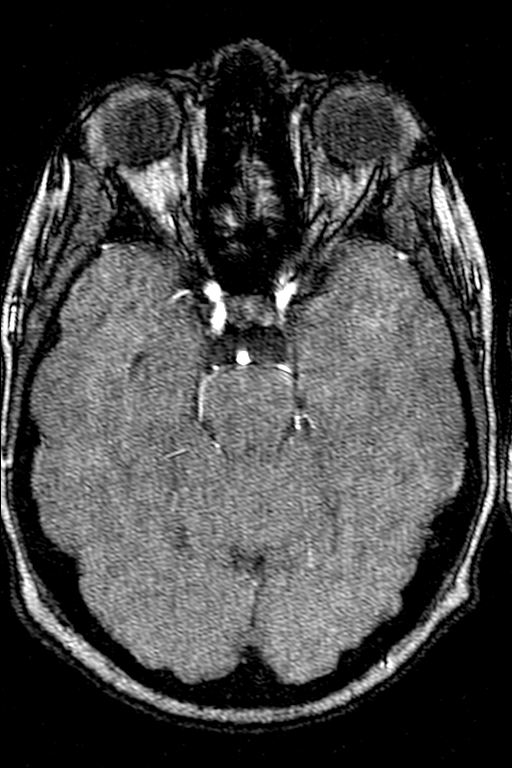
[im 92/160]
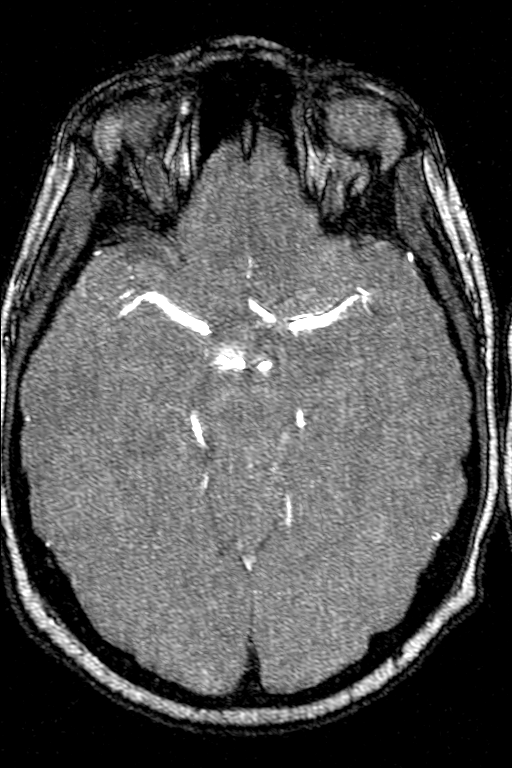
[im 112/160]
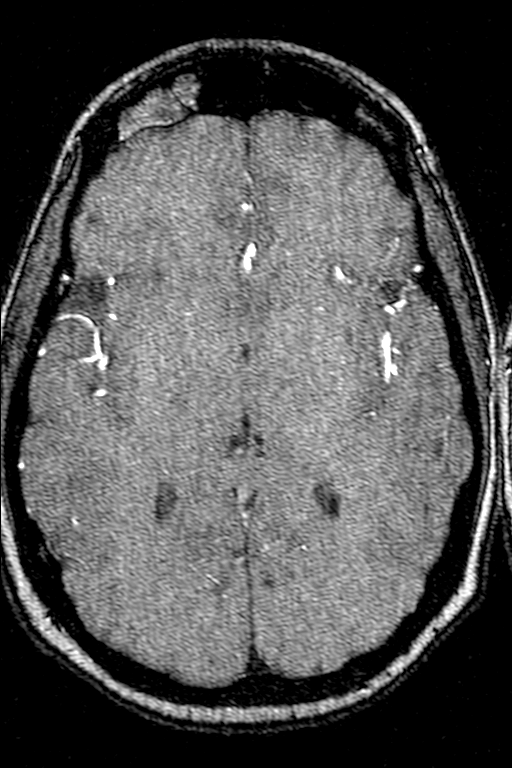
[im 132/160]
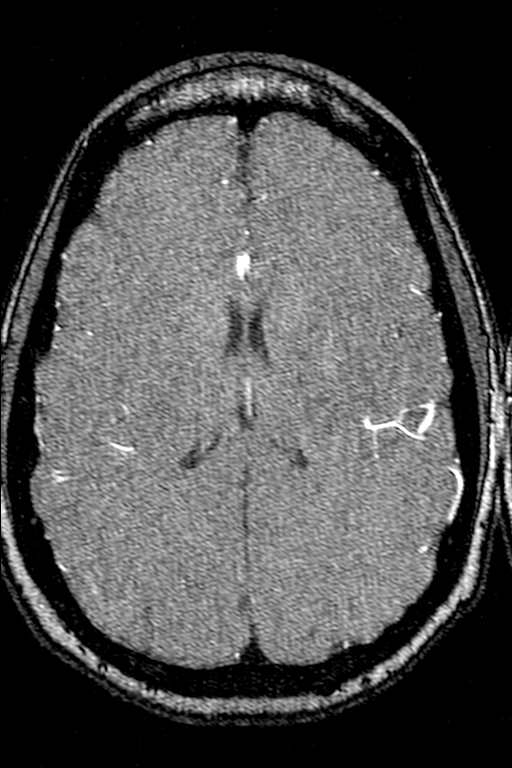
[im 136/160]
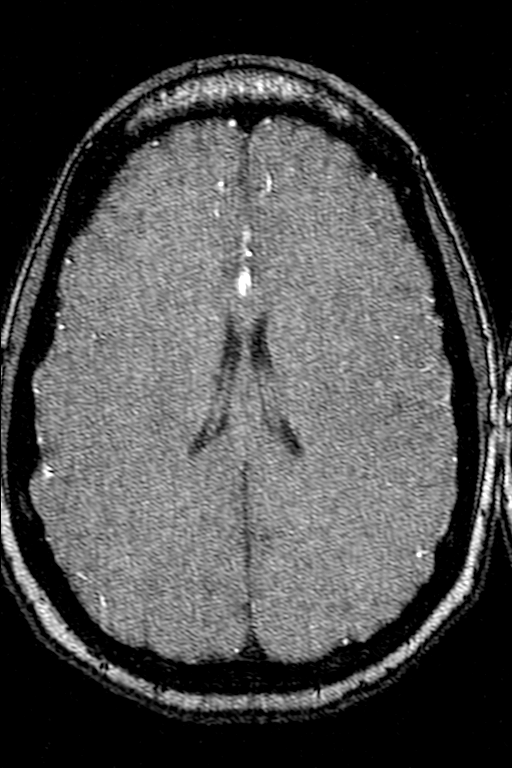
[im 153/160]
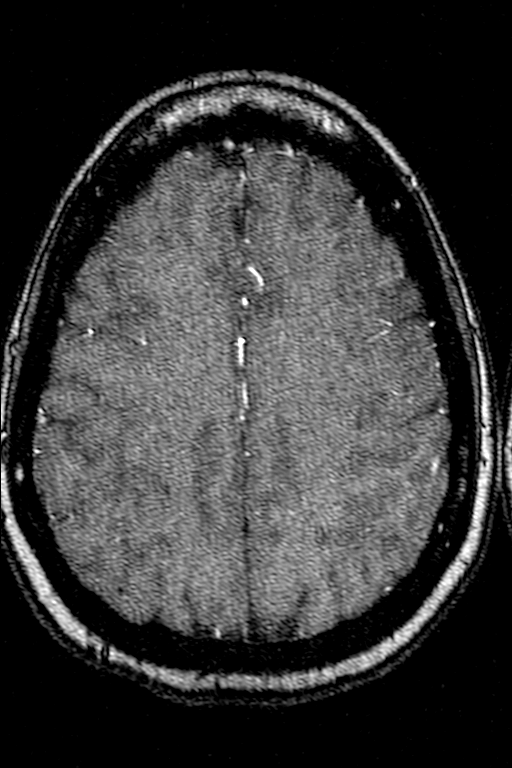

[13 of 48 positions shown; findings below may reference images not displayed]

FINDINGS: The internal carotid arteries in their petrous, cavernous
and supraclinoid segments are widely patent.

Both middle cerebral arteries including the trifurcation branches
bilaterally are of adequate caliber and flow signal.

The anterior cerebral arteries and the anterior communicating
artery are widely patent.

The visualized portions of the A2 segments of the anterior cerebral
arteries bilaterally  demonstrate adequate caliber and flow signal.

Vertebrobasilar junctions are patent with flow signal demonstrated
in  the right posterior inferior cerebellar artery.

The basilar artery, the posterior cerebral arteries, the superior
cerebellar arteries and the left anterior inferior cerebellar
artery are of adequate caliber and flow signal.  The right anterior
inferior cerebellar artery is hypoplastic.

Source  images demonstrate flow signal signal in the left posterior
inferior cerebellar artery.
IMPRESSION: 1.  No occlusions, stenosis, dissections or aneurysms noted on the
study.

## 2011-12-30 ENCOUNTER — Telehealth: Payer: Self-pay | Admitting: Gastroenterology

## 2011-12-30 NOTE — Telephone Encounter (Signed)
PT SEEN IN ED JAN 21 FOR VAGINAL/BLADDER PAIN AFTER DMSO INFUSION.

## 2011-12-30 NOTE — Telephone Encounter (Signed)
Routing to Susan to schedule appt.  

## 2011-12-30 NOTE — Telephone Encounter (Signed)
Please call pt. She should go to the ED if she has sever abdominal pain. She can try Maalox or Mylanta for relief of upper abdominal pain. She should add Prilosec 20 mg bid for 2 weeks then once every morning for one year, #62, ZOX09. SHE SHOULD FOLLOW A FULL LIQUID DIET FOR 3 DAYS THEN SHE SHOULD follow a low fat diet. SEND A HO. OPV IN 7-10 DAYS E 30 VISIT, DX: ABDOMINAL PAIN.   Full Liquid Diet A high-calorie, high-protein supplement should be used to meet your nutritional requirements when the full liquid diet is continued for more than 2 or 3 days. If this diet is to be used for an extended period of time (more than 7 days), a multivitamin should be considered.  Breads and Starches  Allowed: None are allowed except crackersWHOLE OR pureed (made into a thick, smooth soup) in soup. Cooked, refined corn, oat, rice, rye, and wheat cereals are also allowed.   Avoid: Any others.    Potatoes/Pasta/Rice  Allowed: ANY ITEM AS A SOUP OR SMALL PLATE OF MASHED POTATOES OR RICE.       Vegetables  Allowed: Strained tomato or vegetable juice. Vegetables pureed in soup.   Avoid: Any others.    Fruit  Allowed: Any strained fruit juices and fruit drinks. Include 1 serving of citrus or vitamin C-enriched fruit juice daily.   Avoid: Any others.  Meat and Meat Substitutes  Allowed: Egg  Avoid: Any meat, fish, or fowl. All cheese.  Milk  Allowed: SOY Milk beverages, including milk shakes and instant breakfast mixes. Smooth yogurt.   Avoid: Any others. Avoid dairy products if not tolerated.    Soups and Combination Foods  Allowed: Broth, strained cream soups. Strained, broth-based soups.   Avoid: Any others.    Desserts and Sweets  Allowed: flavored gelatin, tapioca, plain LACTOSE FREE ice cream, sherbet, smooth pudding, junket, fruit ices, frozen ice pops, pudding pops,, frozen fudge pops, chocolate syrup. Sugar, honey, jelly, syrup.   Avoid: Any others.  Fats and  Oils  Allowed: Margarine, butter, cream, sour cream, oils.   Avoid: Any others.  Beverages  Allowed: All.   Avoid: None.  Condiments  Allowed: Iodized salt, pepper, spices, flavorings. Cocoa powder.   Avoid: Any others.    SAMPLE MEAL PLAN Breakfast   cup orange juice.   1 cup cooked wheat cereal.   1 cup SOY milk.   1 cup beverage (coffee or tea).   Cream or sugar, if desired.    Midmorning Snack  2 SCRAMBLED OR HARD BOILED EGG   Lunch  1 cup cream soup.    cup fruit juice.   1 cup SOY milk.    cup custard.   1 cup beverage (coffee or tea).   Cream or sugar, if desired.    Midafternoon Snack  1 cup VANILLA SOY milk shake.  Dinner  1 cup cream soup.    cup fruit juice.   1 cup SOY MILK milk.    cup pudding.   1 cup beverage (coffee or tea).   Cream or sugar, if desired.  Evening Snack  1 cup supplement.    To increase calories, add sugar, or margarine if possible. Nutritional supplements will also increase the total calories.     Low-Fat Diet BREADS, CEREALS, PASTA, RICE, DRIED PEAS, AND BEANS These products are high in carbohydrates and most are low in fat. Therefore, they can be increased in the diet as substitutes for fatty foods. They too,  however, contain calories and should not be eaten in excess. Cereals can be eaten for snacks as well as for breakfast.  Include foods that contain fiber (fruits, vegetables, whole grains, and legumes). Research shows that fiber may lower blood cholesterol levels, especially the water-soluble fiber found in fruits, vegetables, oat products, and legumes. FRUITS AND VEGETABLES It is good to eat fruits and vegetables. Besides being sources of fiber, both are rich in vitamins and some minerals. They help you get the daily allowances of these nutrients. Fruits and vegetables can be used for snacks and desserts. MEATS Limit lean meat, chicken, [place], and fish to no more than 6 ounces per  day. Beef, Pork, and Lamb Use lean cuts of beef, pork, and lamb. Lean cuts include:  Extra-lean ground beef.  Arm roast.  Sirloin tip.  Center-cut ham.  Round steak.  Loin chops.  Rump roast.  Tenderloin.  Trim all fat off the outside of meats before cooking. It is not necessary to severely decrease the intake of red meat, but lean choices should be made. Lean meat is rich in protein and contains a highly absorbable form of iron. Premenopausal women, in particular, should avoid reducing lean red meat because this could increase the risk for low red blood cells (iron-deficiency anemia). Processed Meats Processed meats, such as bacon, bologna, salami, sausage, and hot dogs contain large quantities of fat, are not rich in valuable nutrients, and should not be eaten very often. Organ Meats The organ meats, such as liver, sweetbreads, kidneys, and brain are very rich in cholesterol. They should be limited. Chicken and [place] These are good sources of protein. The fat of poultry can be reduced by removing the skin and underlying fat layers before cooking. Chicken and [place] can be substituted for lean red meat in the diet. Poultry should not be fried or covered with high-fat sauces. Fish and Shellfish Fish is a good source of protein. Shellfish contain cholesterol, but they usually are low in saturated fatty acids. The preparation of fish is important. Like chicken and [place], they should not be fried or covered with high-fat sauces. EGGS Egg yolks often are hidden in cooked and processed foods. Egg whites contain no fat or cholesterol. They can be eaten often. Try 1 to 2 egg whites instead of whole eggs in recipes or use egg substitutes that do not contain yolk. MILK AND DAIRY PRODUCTS Use skim or 1% milk instead of 2% or whole milk. Decrease whole milk, natural, and processed cheeses. Use nonfat or low-fat (2%) cottage cheese or low-fat cheeses made from vegetable oils. Choose nonfat or low-fat (1  to 2%) yogurt. Experiment with evaporated skim milk in recipes that call for heavy cream. Substitute low-fat yogurt or low-fat cottage cheese for sour cream in dips and salad dressings. Have at least 2 servings of low-fat dairy products, such as 2 glasses of skim (or 1%) milk each day to help get your daily calcium intake. FATS AND OILS Reduce the total intake of fats, especially saturated fat. Butterfat, lard, and beef fats are high in saturated fat and cholesterol. These should be avoided as much as possible. Vegetable fats do not contain cholesterol, but certain vegetable fats, such as coconut oil, palm oil, and palm kernel oil are very high in saturated fats. These should be limited. These fats are often used in bakery goods, processed foods, popcorn, oils, and nondairy creamers. Vegetable shortenings and some peanut butters contain hydrogenated oils, which are also saturated fats. Read the labels  on these foods and check for saturated vegetable oils. Unsaturated vegetable oils and fats do not raise blood cholesterol. However, they should be limited because they are fats and are high in calories. Total fat should still be limited to 30% of your daily caloric intake. Desirable liquid vegetable oils are corn oil, cottonseed oil, olive oil, canola oil, safflower oil, soybean oil, and sunflower oil. Peanut oil is not as good, but small amounts are acceptable. Buy a heart-healthy tub margarine that has no partially hydrogenated oils in the ingredients. Mayonnaise and salad dressings often are made from unsaturated fats, but they should also be limited because of their high calorie and fat content. Seeds, nuts, peanut butter, olives, and avocados are high in fat, but the fat is mainly the unsaturated type. These foods should be limited mainly to avoid excess calories and fat. OTHER EATING TIPS Snacks  Most sweets should be limited as snacks. They tend to be rich in calories and fats, and their caloric content  outweighs their nutritional value. Some good choices in snacks are graham crackers, melba toast, soda crackers, bagels (no egg), English muffins, fruits, and vegetables. These snacks are preferable to snack crackers, [place] fries, and chips. Popcorn should be air-popped or cooked in small amounts of liquid vegetable oil. Desserts Eat fruit, low-fat yogurt, and fruit ices instead of pastries, cake, and cookies. Sherbet, angel food cake, gelatin dessert, frozen low-fat yogurt, or other frozen products that do not contain saturated fat (pure fruit juice bars, frozen ice pops) are also acceptable.  COOKING METHODS Choose those methods that use little or no fat. They include: Poaching.  Braising.  Steaming.  Grilling.  Baking.  Stir-frying.  Broiling.  Microwaving.  Foods can be cooked in a nonstick pan without added fat, or use a nonfat cooking spray in regular cookware. Limit fried foods and avoid frying in saturated fat. Add moisture to lean meats by using water, broth, cooking wines, and other nonfat or low-fat sauces along with the cooking methods mentioned above. Soups and stews should be chilled after cooking. The fat that forms on top after a few hours in the refrigerator should be skimmed off. When preparing meals, avoid using excess salt. Salt can contribute to raising blood pressure in some people. EATING AWAY FROM HOME Order entres, potatoes, and vegetables without sauces or butter. When meat exceeds the size of a deck of cards (3 to 4 ounces), the rest can be taken home for another meal. Choose vegetable or fruit salads and ask for low-calorie salad dressings to be served on the side. Use dressings sparingly. Limit high-fat toppings, such as bacon, crumbled eggs, cheese, sunflower seeds, and olives. Ask for heart-healthy tub margarine instead of butter.

## 2011-12-30 NOTE — Telephone Encounter (Signed)
Pt returned call. Said she had a very bad time with abdominal pain yesterday, and could hardly stand. She was doing very well today until after she ate lunch,. She had potato soup and a [place] sandwich with mayo and a little lettuce. Said she started having abdominal pain and nearly doubled over. It is better now, but she still has pain below her breast and more to the right. She has has constant nausea but has not vomited. Please advise!

## 2011-12-30 NOTE — Telephone Encounter (Signed)
Called and informed pt. Rx called to April at [facility]. Diets at front for pick-up.

## 2011-12-30 NOTE — Telephone Encounter (Signed)
Called and left message on machine for pt to call me with a little more info. I told her it sound like if she was doubled over with pain that she needs to go to the ED, but if she hasn't,  to please give me a call and let me have some more info and I will relay to Dr. Darrick Penna.

## 2011-12-30 NOTE — Telephone Encounter (Signed)
PLEASE CALL PT. IF SHE'S HAVING SEVERE PAIN SHE SHOULD GO TO THE ED.

## 2011-12-30 NOTE — Telephone Encounter (Signed)
See Dr. Darrick Penna telephone note of 12/30/2011.

## 2011-12-30 NOTE — Telephone Encounter (Signed)
Having severe abdominal pain and nausea and vomiting/doubled over in pain/please advise asap

## 2011-12-31 NOTE — Telephone Encounter (Signed)
Pt is aware of her OV 

## 2012-01-06 ENCOUNTER — Encounter: Payer: Self-pay | Admitting: Gastroenterology

## 2012-01-06 ENCOUNTER — Ambulatory Visit (INDEPENDENT_AMBULATORY_CARE_PROVIDER_SITE_OTHER): Payer: Medicaid Other | Admitting: Gastroenterology

## 2012-01-06 VITALS — BP 139/86 | HR 85 | Temp 97.2°F | Ht 64.0 in | Wt 220.0 lb

## 2012-01-06 DIAGNOSIS — K589 Irritable bowel syndrome without diarrhea: Secondary | ICD-10-CM

## 2012-01-06 MED ORDER — DICYCLOMINE HCL 10 MG PO CAPS: ORAL_CAPSULE | ORAL | Status: DC

## 2012-01-06 MED ORDER — PROMETHAZINE HCL 25 MG PO TABS: ORAL_TABLET | ORAL | Status: DC

## 2012-01-06 NOTE — Progress Notes (Signed)
Faxed to PCP

## 2012-01-06 NOTE — Patient Instructions (Signed)
USE BENTYL AS NEEDED FOR ABDOMINAL CRAMPS AND DIARRHEA. USE PHENERGAN AS NEEDED.

## 2012-01-06 NOTE — Progress Notes (Signed)
Subjective:    Patient ID: Penny [name], female    DOB: September 10, 1986, 26 y.o.   MRN: [id number]  HPI HAD BAD ABDOMINAL PAIN LAST WEEK. BEING MANAGED FOR IC FOR DMSO. TREATMENT WAS HORRIBLE. Held for 15 mins and 30 mins. ALMOST PASED OUT IN THE OFFICE. LATER ON went to ED. Got pain management. Associated abdominal pain was bad. Bms: regular, 2 bmS/day. Wants to go on a REGULAR SCHEDULE. MAY TAKE EQUATE PRN. Last diarrhea-yellow watery stools. NO STOOL SOFTENERS. NOT USING BENTYL misplaced it. No abd pain today.  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  Past Medical History  Diagnosis Date  . Migraines   . Interstitial cystitis   . IUD FEB 2010  . Gastritis JULY 2011  . Depression   . 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 Fatty liver   . Obesity (BMI 30-39.9) 2011 228 LBS BMI 36.8  . Iron deficiency anemia 07/23/2010  . Irritable bowel syndrome 2012 DIARRHEA    JUN 2012 TTG IgA 14.9  . Elevated liver enzymes JUL 2011ALK PHOS 111-127 AST  143-267 ALT  213-321T BILI 0.6  ALB  3.7-4.06 Jun 2011 ALK PHOS 118 AST 24 ALT 42 T BILI 0.4 ALB 3.9  . Chronic daily headache    Past Surgical History  Procedure Date  . Gab 2007    in [place]-POUCH 5 CM  . Cholecystectomy 2005    biliary dyskinesia  . Cath self every nite     for sodium bicarb injection  . Colonoscopy JUN 2012 ABD PN/DIARRHEA WITH PROPOFOL    NL COLON  . Upper gastrointestinal endoscopy JULY 2011 NAUSEA-D125,V6, PH 25    Bx; GASTRITIS, POUCH-5 CM LONG   Allergies  Allergen Reactions  . Codeine     VOMTIING  . Gabapentin     UNRESPONSIVE BUT VITALS STABLE  . Latex     RASH  . Reglan     FEELS LIKE SHE'S IN A BOX AND CAN'T GET OUT    Current Outpatient Prescriptions  Medication Sig Dispense Refill  . amphetamine-dextroamphetamine (ADDERALL XR) 20 MG 24 hr capsule Take 20 mg by mouth every morning.      . Aspirin-Acetaminophen-Caffeine (PAMPRIN MAX PO) Take 1 tablet by mouth daily as needed. Menstrual Pain       . BIOTIN  PO Take 1 tablet by mouth daily.      . clonazePAM (KLONOPIN) 1 MG tablet Take 1 mg by mouth 4 (four) times daily as needed. Anxiety      . diazepam (VALIUM) 10 MG tablet Take 10 mg by mouth daily.      Marland Kitchen dicyclomine (BENTYL) 10 MG capsule Take 10 mg by mouth 4 (four) times daily -  before meals and at bedtime.      Marland Kitchen eletriptan (RELPAX) 40 MG tablet One tablet by mouth as needed for migraine headache.  If the headache improves and then returns, dose may be repeated after 2 hours have elapsed since first dose (do not exceed 80 mg per day). may repeat in 2 hours if necessary       . ferumoxytol (FERAHEME) 510 MG/17ML SOLN Inject 510 mg into the vein every 6 (six) months. Iron Levels      . flavoxATE (URISPAS) 100 MG tablet Take 100 mg by mouth 4 (four) times daily as needed.      Marland Kitchen HYDROcodone-acetaminophen (NORCO) 10-325 MG per tablet Take 1 tablet by mouth every 6 (six) hours  as needed. For pain      . levonorgestrel (MIRENA) 20 MCG/24HR IUD 1 each by Intrauterine route once.        . loratadine (CLARITIN) 10 MG tablet Take 10 mg by mouth at bedtime.       Marland Kitchen omeprazole (PRILOSEC) 20 MG capsule Take 20 mg by mouth 2 (two) times daily.      . promethazine (PHENERGAN) 25 MG tablet Take 25 mg by mouth every 6 (six) hours as needed. For nausea and vomiting      . QUEtiapine (SEROQUEL) 100 MG tablet Take 100 mg by mouth at bedtime.    Marland Kitchen oxyCODONE-acetaminophen (PERCOCET) 10-325 MG per tablet Take 1 tablet by mouth every 4 (four) hours as needed for pain.          Review of Systems     Objective:   Physical Exam  Vitals reviewed. Constitutional: She is oriented to person, place, and time. No distress.  HENT:  Head: Normocephalic and atraumatic.  Cardiovascular: Normal rate, regular rhythm and normal heart sounds.   Pulmonary/Chest: Effort normal and breath sounds normal. No respiratory distress.  Abdominal: Soft. Bowel sounds are normal. She exhibits no distension. There is tenderness (mild  BLQ TTP). There is no rebound and no guarding.  Neurological: She is alert and oriented to person, place, and time.       NO FOCAL DEFICITS           Assessment & Plan:

## 2012-01-06 NOTE — Assessment & Plan Note (Signed)
sX FAIRLY WELL CONTROLLED. PT HAD ACUTE INCREASE IN ABDOMINAL PAIN AFTER DMSO. SX IMPROVED.  CONTINUE BENTYL PRN. PHENERGAN PRN. OPV IN 6 MOS.

## 2012-01-12 NOTE — Progress Notes (Signed)
Reminder in epic to follow up in 6 months E30 °

## 2012-01-27 ENCOUNTER — Ambulatory Visit (HOSPITAL_COMMUNITY): Payer: Medicaid Other | Admitting: Oncology

## 2012-02-12 ENCOUNTER — Emergency Department (HOSPITAL_COMMUNITY)
Admission: EM | Admit: 2012-02-12 | Discharge: 2012-02-13 | Disposition: A | Discharge status: Home / Self Care | Payer: Medicaid Other | Attending: Emergency Medicine | Admitting: Emergency Medicine

## 2012-02-12 ENCOUNTER — Encounter (HOSPITAL_COMMUNITY): Payer: Self-pay

## 2012-02-12 DIAGNOSIS — R42 Dizziness and giddiness: Secondary | ICD-10-CM | POA: Insufficient documentation

## 2012-02-12 DIAGNOSIS — N898 Other specified noninflammatory disorders of vagina: Secondary | ICD-10-CM | POA: Insufficient documentation

## 2012-02-12 DIAGNOSIS — R350 Frequency of micturition: Secondary | ICD-10-CM | POA: Insufficient documentation

## 2012-02-12 DIAGNOSIS — N3010 Interstitial cystitis (chronic) without hematuria: Secondary | ICD-10-CM

## 2012-02-12 DIAGNOSIS — F3289 Other specified depressive episodes: Secondary | ICD-10-CM | POA: Insufficient documentation

## 2012-02-12 DIAGNOSIS — R3915 Urgency of urination: Secondary | ICD-10-CM | POA: Insufficient documentation

## 2012-02-12 DIAGNOSIS — R300 Dysuria: Secondary | ICD-10-CM | POA: Insufficient documentation

## 2012-02-12 DIAGNOSIS — Z79899 Other long term (current) drug therapy: Secondary | ICD-10-CM | POA: Insufficient documentation

## 2012-02-12 DIAGNOSIS — N949 Unspecified condition associated with female genital organs and menstrual cycle: Secondary | ICD-10-CM | POA: Insufficient documentation

## 2012-02-12 DIAGNOSIS — F329 Major depressive disorder, single episode, unspecified: Secondary | ICD-10-CM | POA: Insufficient documentation

## 2012-02-12 DIAGNOSIS — R197 Diarrhea, unspecified: Secondary | ICD-10-CM | POA: Insufficient documentation

## 2012-02-12 LAB — URINALYSIS, ROUTINE W REFLEX MICROSCOPIC
Glucose, UA: NEGATIVE mg/dL
Hgb urine dipstick: NEGATIVE
Nitrite: NEGATIVE
pH: 6 (ref 5.0–8.0)

## 2012-02-12 LAB — PREGNANCY, URINE: Preg Test, Ur: NEGATIVE

## 2012-02-12 NOTE — ED Notes (Signed)
UTI for 2 weeks; MD has been treating it without improvement per pt.

## 2012-02-13 LAB — BASIC METABOLIC PANEL
BUN: 9 mg/dL (ref 6–23)
CO2: 26 mEq/L (ref 19–32)
Chloride: 100 mEq/L (ref 96–112)
Creatinine, Ser: 0.66 mg/dL (ref 0.50–1.10)
GFR calc Af Amer: >90 mL/min (ref >90)
Potassium: 3.4 mEq/L — ABNORMAL LOW (ref 3.5–5.1)

## 2012-02-13 LAB — DIFFERENTIAL
Basophils Relative: 0 % (ref 0–1)
Lymphocytes Relative: 45 % (ref 12–46)
Monocytes Absolute: 0.6 10*3/uL (ref 0.1–1.0)
Monocytes Relative: 7 % (ref 3–12)
Neutro Abs: 3.6 10*3/uL (ref 1.7–7.7)
Neutrophils Relative %: 47 % (ref 43–77)

## 2012-02-13 LAB — CBC
HCT: 40.7 % (ref 36.0–46.0)
Hemoglobin: 14.3 g/dL (ref 12.0–15.0)
RBC: 4.34 MIL/uL (ref 3.87–5.11)
WBC: 7.8 10*3/uL (ref 4.0–10.5)

## 2012-02-13 MED ORDER — LOPERAMIDE HCL 2 MG PO CAPS
4.0000 mg | ORAL_CAPSULE | Freq: Once | ORAL | Status: AC
  Administered 2012-02-13: 4 mg via ORAL
  Filled 2012-02-13: qty 2

## 2012-02-13 MED ORDER — HYDROCODONE-ACETAMINOPHEN 5-325 MG PO TABS
1.0000 | ORAL_TABLET | Freq: Once | ORAL | Status: AC
  Administered 2012-02-13: 1 via ORAL
  Filled 2012-02-13: qty 1

## 2012-02-13 MED ORDER — SODIUM CHLORIDE 0.9 % IV BOLUS (SEPSIS)
1000.0000 mL | Freq: Once | INTRAVENOUS | Status: AC
  Administered 2012-02-13: 1000 mL via INTRAVENOUS

## 2012-02-13 NOTE — ED Notes (Signed)
MD notified of patient's pain. No new orders given at this time.

## 2012-02-13 NOTE — ED Provider Notes (Signed)
Nursing notes and vitals signs, including pulse oximetry, reviewed.  Summary of this visit's results, reviewed by myself:  Labs:  Results for orders placed during the hospital encounter of 02/12/12  URINALYSIS, ROUTINE W REFLEX MICROSCOPIC      Component Value Range   Color, Urine YELLOW  YELLOW    APPearance CLEAR  CLEAR    Specific Gravity, Urine 1.025  1.005 - 1.030    pH 6.0  5.0 - 8.0    Glucose, UA NEGATIVE  NEGATIVE (mg/dL)   Hgb urine dipstick NEGATIVE  NEGATIVE    Bilirubin Urine MODERATE (*) NEGATIVE    Ketones, ur TRACE (*) NEGATIVE (mg/dL)   Protein, ur NEGATIVE  NEGATIVE (mg/dL)   Urobilinogen, UA 0.2  0.0 - 1.0 (mg/dL)   Nitrite NEGATIVE  NEGATIVE    Leukocytes, UA NEGATIVE  NEGATIVE   PREGNANCY, URINE      Component Value Range   Preg Test, Ur NEGATIVE  NEGATIVE   WET PREP, GENITAL      Component Value Range   Yeast Wet Prep HPF POC NONE SEEN  NONE SEEN    Trich, Wet Prep NONE SEEN  NONE SEEN    Clue Cells Wet Prep HPF POC FEW (*) NONE SEEN    WBC, Wet Prep HPF POC FEW (*) NONE SEEN   CBC      Component Value Range   WBC 7.8  4.0 - 10.5 (K/uL)   RBC 4.34  3.87 - 5.11 (MIL/uL)   Hemoglobin 14.3  12.0 - 15.0 (g/dL)   HCT 16.1  09.6 - 04.5 (%)   MCV 93.8  78.0 - 100.0 (fL)   MCH 32.9  26.0 - 34.0 (pg)   MCHC 35.1  30.0 - 36.0 (g/dL)   RDW 40.9  81.1 - 91.4 (%)   Platelets 227  150 - 400 (K/uL)  DIFFERENTIAL      Component Value Range   Neutrophils Relative 47  43 - 77 (%)   Neutro Abs 3.6  1.7 - 7.7 (K/uL)   Lymphocytes Relative 45  12 - 46 (%)   Lymphs Abs 3.5  0.7 - 4.0 (K/uL)   Monocytes Relative 7  3 - 12 (%)   Monocytes Absolute 0.6  0.1 - 1.0 (K/uL)   Eosinophils Relative 1  0 - 5 (%)   Eosinophils Absolute 0.1  0.0 - 0.7 (K/uL)   Basophils Relative 0  0 - 1 (%)   Basophils Absolute 0.0  0.0 - 0.1 (K/uL)  BASIC METABOLIC PANEL      Component Value Range   Sodium 137  135 - 145 (mEq/L)   Potassium 3.4 (*) 3.5 - 5.1 (mEq/L)   Chloride 100  96 -  112 (mEq/L)   CO2 26  19 - 32 (mEq/L)   Glucose, Bld 123 (*) 70 - 99 (mg/dL)   BUN 9  6 - 23 (mg/dL)   Creatinine, Ser 7.82  0.50 - 1.10 (mg/dL)   Calcium 95.6  8.4 - 10.5 (mg/dL)   GFR calc non Af Amer >90  >90 (mL/min)   GFR calc Af Amer >90  >90 (mL/min)   Medical screening examination/treatment/procedure(s) were conducted as a shared visit with non-physician practitioner(s) and myself.  I personally evaluated the patient during the encounter  Awake alert no acute distress. Patient has appointment with her OB/GYN in 4 days. Advised of lab findings.    Hanley Seamen, MD 02/13/12 [phone]

## 2012-02-13 NOTE — Discharge Instructions (Signed)
Interstitial Cystitis Interstitial cystitis (IC) is a condition that results in discomfort or pain in the bladder and the surrounding pelvic region. The symptoms can be different from case to case and even in the same individual. People may experience:  Mild discomfort.   Pressure.   Tenderness.   Intense pain in the bladder and pelvic area.  CAUSES  Because IC varies so much in symptoms and severity, people studying this disease believe it is not one but several diseases. Some caregivers use the term painful bladder syndrome (PBS) to describe cases with painful urinary symptoms. This may not meet the strictest definition of IC. The term IC / PBS includes all cases of urinary pain that cannot be connected to other causes, such as infection or urinary stones.  SYMPTOMS  Symptoms may include:  An urgent need to urinate.   A frequent need to urinate.   A combination of these symptoms.  Pain may change in intensity as the bladder fills with urine or as it empties. Women's symptoms often get worse during menstruation. They may sometimes experience pain with vaginal intercourse. Some of the symptoms of IC / PBS seem like those of bacterial infection. Tests do not show infection. IC / PBS is far more common in women than in men.  DIAGNOSIS  The diagnosis of IC / PBS is based on:  Presence of pain related to the bladder, usually along with problems of frequency and urgency.   Not finding other diseases that could cause the symptoms.   Diagnostic tests that help rule out other diseases include:   Urinalysis.   Urine culture.   Cystoscopy.   Biopsy of the bladder wall.   Distension of the bladder under anesthesia.   Urine cytology.   Laboratory examination of prostate secretions.  A biopsy is a tissue sample that can be looked at under a microscope. Samples of the bladder and urethra may be removed during a cystoscopy. A biopsy helps rule out bladder cancer. TREATMENT  Scientists  have not yet found a cure for IC / PBS. Patients with IC / PBS do not get better with antibiotic therapy. Caregivers cannot predict who will respond best to which treatment. Symptoms may disappear without explanation. Disappearing symptoms may coincide with an event such as a change in diet or treatment. Even when symptoms disappear, they may return after days, weeks, months, or years.  Because the causes of IC / PBS are unknown, current treatments are aimed at relieving symptoms. Many people are helped by one or a combination of the treatments. As researchers learn more about IC / PBS, the list of potential treatments will change. Patients should discuss their options with a caregiver. SURGERY  Surgery should be considered only if all available treatments have failed and the pain is disabling. Many approaches and techniques are used. Each approach has its own advantages and complications. Advantages and complications should be discussed with a urologist. Your caregiver may recommend consulting another urologist for a second opinion. Most caregivers are reluctant to operate because the outcome is unpredictable. Some people still have symptoms after surgery.   People considering surgery should discuss the potential risks and benefits, side effects, and long- and short-term complications with their family, as well as with people who have already had the procedure. Surgery requires anesthesia, hospitalization, and in some cases weeks or months of recovery. As the complexity of the procedure increases, so do the chances for complications and for failure.  HOME CARE INSTRUCTIONS   All   drugs, even those sold over the counter, have side effects. Patients should always consult a caregiver before using any drug for an extended amount of time. Only take over-the-counter or prescription medicines for pain, discomfort, or fever as directed by your caregiver.   Many patients feel that smoking makes their symptoms  worse. How the by-products of tobacco that are excreted in the urine affect IC / PBS is unknown. Smoking is the major known cause of bladder cancer. One of the best things smokers can do for their bladder and their overall health is to quit.   Many patients feel that gentle stretching exercises help relieve IC / PBS symptoms.   Methods vary, but basically patients decide to empty their bladder at designated times and use relaxation techniques and distractions to keep to the schedule. Gradually, patients try to lengthen the time between scheduled voids. A diary in which to record voiding times is usually helpful in keeping track of progress.  MAKE SURE YOU:   Understand these instructions.   Will watch your condition.   Will get help right away if you are not doing well or get worse.  Document Released: 07/23/2004 Document Revised: 11/11/2011 Document Reviewed: 10/07/2008 [employer] Patient Information 2012 [employer]. 

## 2012-02-13 NOTE — ED Provider Notes (Signed)
History     CSN: [id number]  Arrival date & time 02/12/12  2309   First MD Initiated Contact with Patient 02/13/12 0003      Chief Complaint  Patient presents with  . Urinary Tract Infection    (Consider location/radiation/quality/duration/timing/severity/associated sxs/prior treatment) HPI Comments: Pt has h/o interstitial cystitis and gets DMSO treatments every 2 weeks at dr. Forestine Chute office.  She has been having pelvic/bladder pain x 3 weeks and has recently been treated for a UTI.  Has chronic dysuria.  New frequency and urgency but no hematuria.  No fever or chills.  Denies any sexual activity in over 2 months.  Has yellowish vaginal d/c. Pt tells me during pelvic exam that she has been having 10-12 episodes  Of diarrhea over the past week.  She feels light-headed and sees stars when she stands up.  No vomiting  Patient is a 26 y.o. female presenting with urinary tract infection. The history is provided by the patient. No language interpreter was used.  Urinary Tract Infection This is a chronic problem. Episode onset: 3 weeks ago. The problem occurs constantly. The problem has been unchanged. Associated symptoms include urinary symptoms. Pertinent negatives include no fever. The symptoms are aggravated by nothing.    Past Medical History  Diagnosis Date  . Migraines   . Interstitial cystitis   . IUD FEB 2010  . Gastritis JULY 2011  . Depression   . 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 Fatty liver   . Obesity (BMI 30-39.9) 2011 228 LBS BMI 36.8  . Iron deficiency anemia 07/23/2010  . Irritable bowel syndrome 2012 DIARRHEA    JUN 2012 TTG IgA 14.9  . Elevated liver enzymes JUL 2011ALK PHOS 111-127 AST  143-267 ALT  213-321T BILI 0.6  ALB  3.7-4.06 Jun 2011 ALK PHOS 118 AST 24 ALT 42 T BILI 0.4 ALB 3.9  . Chronic daily headache     Past Surgical History  Procedure Date  . Gab 2007    in [place]-POUCH 5 CM  . Cholecystectomy 2005    biliary dyskinesia  . Cath self  every nite     for sodium bicarb injection  . Colonoscopy JUN 2012 ABD PN/DIARRHEA WITH PROPOFOL    NL COLON  . Upper gastrointestinal endoscopy JULY 2011 NAUSEA-D125,V6, PH 25    Bx; GASTRITIS, POUCH-5 CM LONG    Family History  Problem Relation Age of Onset  . Hemochromatosis Maternal Grandmother   . Migraines Maternal Grandmother   . Hypertension Father   . Diabetes Father   . Migraines Paternal Grandmother     History  Substance Use Topics  . Smoking status: Former Smoker -- 0.5 packs/day    Types: Cigarettes  . Smokeless tobacco: Not on file  . Alcohol Use: No    OB History    Grav Para Term Preterm Abortions TAB SAB Ect Mult Living   1 1 1       1       Review of Systems  Constitutional: Negative for fever.  Genitourinary: Positive for dysuria, urgency, frequency and vaginal discharge. Negative for hematuria.  All other systems reviewed and are negative.    Allergies  Codeine; Gabapentin; Latex; and Reglan  Home Medications   Current Outpatient Rx  Name Route Sig Dispense Refill  . AMPHETAMINE-DEXTROAMPHET ER 20 MG PO CP24 Oral Take 20 mg by mouth every morning.    Marland Kitchen PAMPRIN MAX PO Oral Take 1  tablet by mouth daily as needed. Menstrual Pain     . BIOTIN PO Oral Take 1 tablet by mouth daily.    Marland Kitchen CLONAZEPAM 1 MG PO TABS Oral Take 1 mg by mouth 4 (four) times daily as needed. Anxiety    . DIAZEPAM 10 MG PO TABS Oral Take 10 mg by mouth daily.    Marland Kitchen DICYCLOMINE HCL 10 MG PO CAPS  1 PO QAC AND HS 120 capsule 11  . ELETRIPTAN HYDROBROMIDE 40 MG PO TABS Oral One tablet by mouth as needed for migraine headache.  If the headache improves and then returns, dose may be repeated after 2 hours have elapsed since first dose (do not exceed 80 mg per day). may repeat in 2 hours if necessary     . FERUMOXYTOL INJECTION 510 MG/17 ML Intravenous Inject 510 mg into the vein every 6 (six) months. Iron Levels    . FLAVOXATE HCL 100 MG PO TABS Oral Take 100 mg by mouth 4 (four)  times daily as needed.    Marland Kitchen HYDROCODONE-ACETAMINOPHEN 10-325 MG PO TABS Oral Take 1 tablet by mouth every 6 (six) hours as needed. For pain    . LEVONORGESTREL 20 MCG/24HR IU IUD Intrauterine 1 each by Intrauterine route once.      Marland Kitchen LORATADINE 10 MG PO TABS Oral Take 10 mg by mouth at bedtime.     . OMEPRAZOLE 20 MG PO CPDR Oral Take 20 mg by mouth 2 (two) times daily.    Marland Kitchen PROMETHAZINE HCL 25 MG PO TABS  1/2 TO 1 PO Q6H PRN For nausea and vomiting 30 tablet 1  . QUETIAPINE FUMARATE 100 MG PO TABS Oral Take 100 mg by mouth at bedtime.      BP 106/55  Pulse 128  Temp(Src) 97.7 F (36.5 C) (Oral)  Resp 18  Ht 5\' 2"  (1.575 m)  Wt 218 lb (98.884 kg)  BMI 39.87 kg/m2  SpO2 98%  Physical Exam  Nursing note and vitals reviewed. Constitutional: She is oriented to person, place, and time. She appears well-developed and well-nourished. No distress.  HENT:  Head: Normocephalic and atraumatic.  Eyes: EOM are normal.  Neck: Normal range of motion.  Cardiovascular: Normal rate, regular rhythm and normal heart sounds.   Pulmonary/Chest: Effort normal and breath sounds normal.  Abdominal: Soft. She exhibits no distension. There is no tenderness.  Genitourinary: Pelvic exam was performed with patient supine. There is no rash, tenderness, lesion or injury on the right labia. There is no rash, tenderness, lesion or injury on the left labia. Vaginal discharge found.       No visible d/c.  Diffuse pain on pelvic exam bimanual   Musculoskeletal: Normal range of motion.  Neurological: She is alert and oriented to person, place, and time.  Skin: Skin is warm and dry.  Psychiatric: She has a normal mood and affect. Judgment normal.    ED Course  Procedures (including critical care time)  Labs Reviewed  URINALYSIS, ROUTINE W REFLEX MICROSCOPIC - Abnormal; Notable for the following:    Bilirubin Urine MODERATE (*)    Ketones, ur TRACE (*)    All other components within normal limits  WET PREP,  GENITAL - Abnormal; Notable for the following:    Clue Cells Wet Prep HPF POC FEW (*)    WBC, Wet Prep HPF POC FEW (*)    All other components within normal limits  BASIC METABOLIC PANEL - Abnormal; Notable for the following:    Potassium  3.4 (*)    Glucose, Bld 123 (*)    All other components within normal limits  URINE CULTURE  PREGNANCY, URINE  RPR  GC/CHLAMYDIA PROBE AMP, GENITAL  CBC  DIFFERENTIAL  LAB REPORT - SCANNED  GC/CHLAMYDIA PROBE AMP, GENITAL  WET PREP, GENITAL   No results found.   1. Interstitial cystitis       MDM  0125- discussed  With dr. Read Drivers. Who has assumed care.        Worthy Rancher, PA 02/15/12 1246

## 2012-02-13 NOTE — ED Notes (Signed)
Patient give a Coke. Moved to room 9 for pelvic exam.

## 2012-02-14 LAB — GC/CHLAMYDIA PROBE AMP, GENITAL
Chlamydia, DNA Probe: NEGATIVE
GC Probe Amp, Genital: NEGATIVE

## 2012-02-15 LAB — URINE CULTURE
Colony Count: NO GROWTH
Culture  Setup Time: 201303102139
Culture: NO GROWTH

## 2012-03-03 ENCOUNTER — Emergency Department (HOSPITAL_COMMUNITY)
Admission: EM | Admit: 2012-03-03 | Discharge: 2012-03-04 | Disposition: A | Discharge status: Home / Self Care | Payer: Medicaid Other | Attending: Emergency Medicine | Admitting: Emergency Medicine

## 2012-03-03 DIAGNOSIS — R109 Unspecified abdominal pain: Secondary | ICD-10-CM | POA: Insufficient documentation

## 2012-03-03 DIAGNOSIS — Z87891 Personal history of nicotine dependence: Secondary | ICD-10-CM | POA: Insufficient documentation

## 2012-03-03 DIAGNOSIS — N3010 Interstitial cystitis (chronic) without hematuria: Secondary | ICD-10-CM | POA: Insufficient documentation

## 2012-03-03 DIAGNOSIS — F329 Major depressive disorder, single episode, unspecified: Secondary | ICD-10-CM | POA: Insufficient documentation

## 2012-03-03 DIAGNOSIS — F3289 Other specified depressive episodes: Secondary | ICD-10-CM | POA: Insufficient documentation

## 2012-03-03 DIAGNOSIS — N390 Urinary tract infection, site not specified: Secondary | ICD-10-CM | POA: Insufficient documentation

## 2012-03-03 DIAGNOSIS — Z79899 Other long term (current) drug therapy: Secondary | ICD-10-CM | POA: Insufficient documentation

## 2012-03-03 NOTE — ED Notes (Signed)
Pt reports bilateral flank pain worsening today, pt being tx for interstitial cystitis

## 2012-03-04 ENCOUNTER — Encounter (HOSPITAL_COMMUNITY): Payer: Self-pay | Admitting: *Deleted

## 2012-03-04 LAB — URINALYSIS, ROUTINE W REFLEX MICROSCOPIC
Glucose, UA: NEGATIVE mg/dL
Leukocytes, UA: NEGATIVE
Nitrite: NEGATIVE
Specific Gravity, Urine: >1.03 — ABNORMAL HIGH (ref 1.005–1.030)
pH: 5.5 (ref 5.0–8.0)

## 2012-03-04 LAB — URINE MICROSCOPIC-ADD ON

## 2012-03-04 MED ORDER — OXYCODONE-ACETAMINOPHEN 5-325 MG PO TABS: 1.0000 | ORAL_TABLET | ORAL | Status: DC | PRN

## 2012-03-04 MED ORDER — OXYCODONE-ACETAMINOPHEN 5-325 MG PO TABS
2.0000 | ORAL_TABLET | Freq: Once | ORAL | Status: AC
  Administered 2012-03-04: 2 via ORAL
  Filled 2012-03-04: qty 2

## 2012-03-04 MED ORDER — CEPHALEXIN 500 MG PO CAPS: 500.0000 mg | ORAL_CAPSULE | Freq: Four times a day (QID) | ORAL | Status: DC

## 2012-03-04 MED ORDER — CEFTRIAXONE SODIUM 1 G IJ SOLR
1.0000 g | Freq: Once | INTRAMUSCULAR | Status: AC
  Administered 2012-03-04: 1 g via INTRAMUSCULAR
  Filled 2012-03-04: qty 10

## 2012-03-04 NOTE — ED Provider Notes (Signed)
History     CSN: [id number]  Arrival date & time 03/03/12  2341   First MD Initiated Contact with Patient 03/04/12 0007      Chief Complaint  Patient presents with  . Flank Pain     The history is provided by the patient.   the patient reports 24 hours of dysuria and urinary frequency.  She denies nausea vomiting or diarrhea.  She reports mild bilateral low back pain.  She has a history of interstitial cystitis and is followed by a local gynecologist Dr. Despina Hidden.  Nothing worsens her symptoms.  Nothing improves her symptoms.  Her symptoms are constant.  Her pain is moderate at this time.  She denies fevers or chills.  She reports her symptoms today feel different than her typical interstitial cystitis discomfort  Past Medical History  Diagnosis Date  . Migraines   . Interstitial cystitis   . IUD FEB 2010  . Gastritis JULY 2011  . Depression   . 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 Fatty liver   . Obesity (BMI 30-39.9) 2011 228 LBS BMI 36.8  . Iron deficiency anemia 07/23/2010  . Irritable bowel syndrome 2012 DIARRHEA    JUN 2012 TTG IgA 14.9  . Elevated liver enzymes JUL 2011ALK PHOS 111-127 AST  143-267 ALT  213-321T BILI 0.6  ALB  3.7-4.06 Jun 2011 ALK PHOS 118 AST 24 ALT 42 T BILI 0.4 ALB 3.9  . Chronic daily headache     Past Surgical History  Procedure Date  . Gab 2007    in [place]-POUCH 5 CM  . Cholecystectomy 2005    biliary dyskinesia  . Cath self every nite     for sodium bicarb injection  . Colonoscopy JUN 2012 ABD PN/DIARRHEA WITH PROPOFOL    NL COLON  . Upper gastrointestinal endoscopy JULY 2011 NAUSEA-D125,V6, PH 25    Bx; GASTRITIS, POUCH-5 CM LONG    Family History  Problem Relation Age of Onset  . Hemochromatosis Maternal Grandmother   . Migraines Maternal Grandmother   . Hypertension Father   . Diabetes Father   . Migraines Paternal Grandmother     History  Substance Use Topics  . Smoking status: Former Smoker -- 0.5 packs/day   Types: Cigarettes  . Smokeless tobacco: Not on file  . Alcohol Use: No    OB History    Grav Para Term Preterm Abortions TAB SAB Ect Mult Living   1 1 1       1       Review of Systems  Genitourinary: Positive for flank pain.  All other systems reviewed and are negative.    Allergies  Codeine; Gabapentin; Latex; and Reglan  Home Medications   Current Outpatient Rx  Name Route Sig Dispense Refill  . AMPHETAMINE-DEXTROAMPHET ER 20 MG PO CP24 Oral Take 20 mg by mouth every morning.    Marland Kitchen PAMPRIN MAX PO Oral Take 1 tablet by mouth daily as needed. Menstrual Pain     . BIOTIN PO Oral Take 1 tablet by mouth daily.    . CEPHALEXIN 500 MG PO CAPS Oral Take 1 capsule (500 mg total) by mouth 4 (four) times daily. 28 capsule 0  . CLONAZEPAM 1 MG PO TABS Oral Take 1 mg by mouth 4 (four) times daily as needed. Anxiety    . DIAZEPAM 10 MG PO TABS Oral Take 10 mg by mouth daily.    Marland Kitchen DICYCLOMINE HCL 10  MG PO CAPS  1 PO QAC AND HS 120 capsule 11  . ELETRIPTAN HYDROBROMIDE 40 MG PO TABS Oral One tablet by mouth as needed for migraine headache.  If the headache improves and then returns, dose may be repeated after 2 hours have elapsed since first dose (do not exceed 80 mg per day). may repeat in 2 hours if necessary     . FERUMOXYTOL INJECTION 510 MG/17 ML Intravenous Inject 510 mg into the vein every 6 (six) months. Iron Levels    . FLAVOXATE HCL 100 MG PO TABS Oral Take 100 mg by mouth 4 (four) times daily as needed.    Marland Kitchen HYDROCODONE-ACETAMINOPHEN 10-325 MG PO TABS Oral Take 1 tablet by mouth every 6 (six) hours as needed. For pain    . LEVONORGESTREL 20 MCG/24HR IU IUD Intrauterine 1 each by Intrauterine route once.      Marland Kitchen LORATADINE 10 MG PO TABS Oral Take 10 mg by mouth at bedtime.     . OMEPRAZOLE 20 MG PO CPDR Oral Take 20 mg by mouth 2 (two) times daily.    . OXYCODONE-ACETAMINOPHEN 5-325 MG PO TABS Oral Take 1 tablet by mouth every 4 (four) hours as needed for pain. 15 tablet 0  .  PROMETHAZINE HCL 25 MG PO TABS Oral Take 1 tablet (25 mg total) by mouth every 6 (six) hours as needed for nausea. 15 tablet 0  . PROMETHAZINE HCL 25 MG PO TABS Oral Take 1 tablet (25 mg total) by mouth every 6 (six) hours as needed for nausea. 8 tablet 0  . PROMETHAZINE HCL 25 MG PO TABS  1/2 TO 1 PO Q6H PRN For nausea and vomiting 30 tablet 1  . QUETIAPINE FUMARATE 100 MG PO TABS Oral Take 100 mg by mouth at bedtime.      BP 125/77  Pulse 88  Temp(Src) 97.9 F (36.6 C) (Oral)  Resp 18  Ht 5\' 4"  (1.626 m)  Wt 218 lb (98.884 kg)  BMI 37.42 kg/m2  SpO2 100%  Physical Exam  Nursing note and vitals reviewed. Constitutional: She is oriented to person, place, and time. She appears well-developed and well-nourished. No distress.  HENT:  Head: Normocephalic and atraumatic.  Eyes: EOM are normal.  Neck: Normal range of motion.  Cardiovascular: Normal rate, regular rhythm and normal heart sounds.   Pulmonary/Chest: Effort normal and breath sounds normal.  Abdominal: Soft. She exhibits no distension. There is no tenderness.  Genitourinary:       No CVA tenderness  Musculoskeletal: Normal range of motion.  Neurological: She is alert and oriented to person, place, and time.  Skin: Skin is warm and dry.  Psychiatric: She has a normal mood and affect. Judgment normal.    ED Course  Procedures (including critical care time)  Labs Reviewed  URINALYSIS, ROUTINE W REFLEX MICROSCOPIC - Abnormal; Notable for the following:    Color, Urine AMBER (*) BIOCHEMICALS MAY BE AFFECTED BY COLOR   Specific Gravity, Urine >1.030 (*)    Bilirubin Urine MODERATE (*)    Ketones, ur TRACE (*)    Protein, ur 30 (*)    All other components within normal limits  URINE MICROSCOPIC-ADD ON - Abnormal; Notable for the following:    Squamous Epithelial / LPF MANY (*)    Bacteria, UA FEW (*)    All other components within normal limits  PREGNANCY, URINE  URINE CULTURE   No results found.   1. Urinary  tract infection  MDM  Will treat for urinary tract infection.  IM Rocephin in the emergency department.  Discharge home on pain medicine and Keflex.  Urine culture sent.  Close followup with her gynecologist.  She understands to return to the ER for new or worsening symptoms        Lyanne Co, MD 03/04/12 0040

## 2012-03-04 NOTE — Discharge Instructions (Signed)
Urinary Tract Infection Infections of the urinary tract can start in several places. A bladder infection (cystitis), a kidney infection (pyelonephritis), and a prostate infection (prostatitis) are different types of urinary tract infections (UTIs). They usually get better if treated with medicines (antibiotics) that kill germs. Take all the medicine until it is gone. You or your child may feel better in a few days, but TAKE ALL MEDICINE or the infection may not respond and may become more difficult to treat. HOME CARE INSTRUCTIONS   Drink enough water and fluids to keep the urine clear or pale yellow. Cranberry juice is especially recommended, in addition to large amounts of water.   Avoid caffeine, tea, and carbonated beverages. They tend to irritate the bladder.   Alcohol may irritate the prostate.   Only take over-the-counter or prescription medicines for pain, discomfort, or fever as directed by your caregiver.  To prevent further infections:  Empty the bladder often. Avoid holding urine for long periods of time.   After a bowel movement, women should cleanse from front to back. Use each tissue only once.   Empty the bladder before and after sexual intercourse.  FINDING OUT THE RESULTS OF YOUR TEST Not all test results are available during your visit. If your or your child's test results are not back during the visit, make an appointment with your caregiver to find out the results. Do not assume everything is normal if you have not heard from your caregiver or the medical facility. It is important for you to follow up on all test results. SEEK MEDICAL CARE IF:   There is back pain.   Your baby is older than 3 months with a rectal temperature of 100.5 F (38.1 C) or higher for more than 1 day.   Your or your child's problems (symptoms) are no better in 3 days. Return sooner if you or your child is getting worse.  SEEK IMMEDIATE MEDICAL CARE IF:   There is severe back pain or lower  abdominal pain.   You or your child develops chills.   You have a fever.   Your baby is older than 3 months with a rectal temperature of 102 F (38.9 C) or higher.   Your baby is 3 months old or younger with a rectal temperature of 100.4 F (38 C) or higher.   There is nausea or vomiting.   There is continued burning or discomfort with urination.  MAKE SURE YOU:   Understand these instructions.   Will watch your condition.   Will get help right away if you are not doing well or get worse.  Document Released: 09/01/2005 Document Revised: 11/11/2011 Document Reviewed: 04/06/2007 [employer] Patient Information 2012 [employer] 

## 2012-03-05 LAB — URINE CULTURE
Colony Count: NO GROWTH
Culture: NO GROWTH

## 2012-03-08 ENCOUNTER — Emergency Department (HOSPITAL_COMMUNITY)
Admission: EM | Admit: 2012-03-08 | Discharge: 2012-03-09 | Disposition: A | Discharge status: Other Acute Inpatient Hospital | Payer: 59 | Source: Home / Self Care | Attending: Emergency Medicine | Admitting: Emergency Medicine

## 2012-03-08 ENCOUNTER — Encounter (HOSPITAL_COMMUNITY): Payer: Self-pay | Admitting: Emergency Medicine

## 2012-03-08 DIAGNOSIS — F32A Depression, unspecified: Secondary | ICD-10-CM

## 2012-03-08 DIAGNOSIS — R45851 Suicidal ideations: Secondary | ICD-10-CM

## 2012-03-08 DIAGNOSIS — F329 Major depressive disorder, single episode, unspecified: Secondary | ICD-10-CM | POA: Insufficient documentation

## 2012-03-08 DIAGNOSIS — F1110 Opioid abuse, uncomplicated: Secondary | ICD-10-CM

## 2012-03-08 DIAGNOSIS — F3289 Other specified depressive episodes: Secondary | ICD-10-CM | POA: Insufficient documentation

## 2012-03-08 NOTE — ED Notes (Signed)
Pt states she is here voluntarily for med clearance  Pt states for the past two months she has been fighting with depression and it has gotten worse over the past few days  Pt states last night she was having thoughts of suicide, no plan

## 2012-03-09 ENCOUNTER — Inpatient Hospital Stay (HOSPITAL_COMMUNITY)
Admission: AD | Admit: 2012-03-09 | Discharge: 2012-03-13 | DRG: 897 | Disposition: A | Discharge status: Home / Self Care | Payer: 59 | Source: Ambulatory Visit | Attending: Psychiatry | Admitting: Psychiatry

## 2012-03-09 DIAGNOSIS — F900 Attention-deficit hyperactivity disorder, predominantly inattentive type: Secondary | ICD-10-CM | POA: Diagnosis present

## 2012-03-09 DIAGNOSIS — R45851 Suicidal ideations: Secondary | ICD-10-CM

## 2012-03-09 DIAGNOSIS — F1120 Opioid dependence, uncomplicated: Secondary | ICD-10-CM | POA: Diagnosis present

## 2012-03-09 DIAGNOSIS — K7689 Other specified diseases of liver: Secondary | ICD-10-CM | POA: Diagnosis present

## 2012-03-09 DIAGNOSIS — F410 Panic disorder [episodic paroxysmal anxiety] without agoraphobia: Secondary | ICD-10-CM | POA: Diagnosis present

## 2012-03-09 DIAGNOSIS — E669 Obesity, unspecified: Secondary | ICD-10-CM | POA: Diagnosis present

## 2012-03-09 DIAGNOSIS — F909 Attention-deficit hyperactivity disorder, unspecified type: Secondary | ICD-10-CM | POA: Diagnosis present

## 2012-03-09 DIAGNOSIS — K589 Irritable bowel syndrome without diarrhea: Secondary | ICD-10-CM | POA: Diagnosis present

## 2012-03-09 DIAGNOSIS — F1994 Other psychoactive substance use, unspecified with psychoactive substance-induced mood disorder: Principal | ICD-10-CM | POA: Diagnosis present

## 2012-03-09 DIAGNOSIS — G43909 Migraine, unspecified, not intractable, without status migrainosus: Secondary | ICD-10-CM | POA: Diagnosis present

## 2012-03-09 DIAGNOSIS — F1920 Other psychoactive substance dependence, uncomplicated: Secondary | ICD-10-CM | POA: Diagnosis present

## 2012-03-09 DIAGNOSIS — D509 Iron deficiency anemia, unspecified: Secondary | ICD-10-CM | POA: Diagnosis present

## 2012-03-09 DIAGNOSIS — F988 Other specified behavioral and emotional disorders with onset usually occurring in childhood and adolescence: Secondary | ICD-10-CM

## 2012-03-09 DIAGNOSIS — Z8669 Personal history of other diseases of the nervous system and sense organs: Secondary | ICD-10-CM

## 2012-03-09 DIAGNOSIS — F418 Other specified anxiety disorders: Secondary | ICD-10-CM | POA: Diagnosis present

## 2012-03-09 LAB — URINALYSIS, ROUTINE W REFLEX MICROSCOPIC
Glucose, UA: NEGATIVE mg/dL
Hgb urine dipstick: NEGATIVE
Leukocytes, UA: NEGATIVE
Specific Gravity, Urine: 1.025 (ref 1.005–1.030)
Urobilinogen, UA: 1 mg/dL (ref 0.0–1.0)

## 2012-03-09 LAB — CBC
MCH: 32.7 pg (ref 26.0–34.0)
MCHC: 35.4 g/dL (ref 30.0–36.0)
Platelets: 297 10*3/uL (ref 150–400)

## 2012-03-09 LAB — BASIC METABOLIC PANEL
Calcium: 9.4 mg/dL (ref 8.4–10.5)
GFR calc non Af Amer: >90 mL/min (ref >90)
Sodium: 137 mEq/L (ref 135–145)

## 2012-03-09 LAB — PREGNANCY, URINE: Preg Test, Ur: NEGATIVE

## 2012-03-09 LAB — RAPID URINE DRUG SCREEN, HOSP PERFORMED
Barbiturates: NOT DETECTED
Cocaine: NOT DETECTED
Opiates: NOT DETECTED

## 2012-03-09 MED ORDER — CHLORDIAZEPOXIDE HCL 25 MG PO CAPS: 25.0000 mg | ORAL_CAPSULE | ORAL | Status: DC

## 2012-03-09 MED ORDER — ACETAMINOPHEN 325 MG PO TABS
650.0000 mg | ORAL_TABLET | Freq: Four times a day (QID) | ORAL | Status: DC | PRN
  Administered 2012-03-10 – 2012-03-13 (×4): 650 mg via ORAL

## 2012-03-09 MED ORDER — LOPERAMIDE HCL 2 MG PO CAPS: 2.0000 mg | ORAL_CAPSULE | ORAL | Status: DC | PRN

## 2012-03-09 MED ORDER — CHLORDIAZEPOXIDE HCL 25 MG PO CAPS: 25.0000 mg | ORAL_CAPSULE | Freq: Three times a day (TID) | ORAL | Status: DC

## 2012-03-09 MED ORDER — ADULT MULTIVITAMIN W/MINERALS CH: 1.0000 | ORAL_TABLET | Freq: Every day | ORAL | Status: DC

## 2012-03-09 MED ORDER — VITAMIN B-1 100 MG PO TABS: 100.0000 mg | ORAL_TABLET | Freq: Every day | ORAL | Status: DC

## 2012-03-09 MED ORDER — CHLORDIAZEPOXIDE HCL 25 MG PO CAPS: 25.0000 mg | ORAL_CAPSULE | Freq: Four times a day (QID) | ORAL | Status: DC

## 2012-03-09 MED ORDER — FLAVOXATE HCL 100 MG PO TABS
100.0000 mg | ORAL_TABLET | Freq: Four times a day (QID) | ORAL | Status: DC | PRN
  Filled 2012-03-09: qty 1

## 2012-03-09 MED ORDER — LORATADINE 10 MG PO TABS
10.0000 mg | ORAL_TABLET | Freq: Every day | ORAL | Status: DC
  Administered 2012-03-09: 10 mg via ORAL
  Filled 2012-03-09: qty 1

## 2012-03-09 MED ORDER — ONDANSETRON 4 MG PO TBDP
4.0000 mg | ORAL_TABLET | Freq: Four times a day (QID) | ORAL | Status: DC | PRN
  Administered 2012-03-10 – 2012-03-11 (×2): 4 mg via ORAL

## 2012-03-09 MED ORDER — ONDANSETRON HCL 4 MG PO TABS: 4.0000 mg | ORAL_TABLET | Freq: Three times a day (TID) | ORAL | Status: DC | PRN

## 2012-03-09 MED ORDER — DICYCLOMINE HCL 10 MG PO CAPS
10.0000 mg | ORAL_CAPSULE | Freq: Three times a day (TID) | ORAL | Status: DC
  Administered 2012-03-09 – 2012-03-13 (×17): 10 mg via ORAL
  Filled 2012-03-09 (×23): qty 1

## 2012-03-09 MED ORDER — IBUPROFEN 600 MG PO TABS: 600.0000 mg | ORAL_TABLET | Freq: Three times a day (TID) | ORAL | Status: DC | PRN

## 2012-03-09 MED ORDER — CHLORDIAZEPOXIDE HCL 25 MG PO CAPS
25.0000 mg | ORAL_CAPSULE | Freq: Three times a day (TID) | ORAL | Status: AC
  Administered 2012-03-09 – 2012-03-10 (×3): 25 mg via ORAL
  Filled 2012-03-09 (×2): qty 1

## 2012-03-09 MED ORDER — CLONAZEPAM 1 MG PO TABS: 1.0000 mg | ORAL_TABLET | Freq: Three times a day (TID) | ORAL | Status: DC | PRN

## 2012-03-09 MED ORDER — QUETIAPINE FUMARATE 200 MG PO TABS
200.0000 mg | ORAL_TABLET | Freq: Every day | ORAL | Status: DC
  Administered 2012-03-09 – 2012-03-12 (×5): 200 mg via ORAL
  Filled 2012-03-09 (×2): qty 1
  Filled 2012-03-09: qty 7
  Filled 2012-03-09 (×4): qty 1

## 2012-03-09 MED ORDER — LOPERAMIDE HCL 2 MG PO CAPS
2.0000 mg | ORAL_CAPSULE | ORAL | Status: DC | PRN
  Administered 2012-03-11: 2 mg via ORAL

## 2012-03-09 MED ORDER — THIAMINE HCL 100 MG/ML IJ SOLN
100.0000 mg | Freq: Once | INTRAMUSCULAR | Status: AC
  Administered 2012-03-09: 100 mg via INTRAMUSCULAR

## 2012-03-09 MED ORDER — CHLORDIAZEPOXIDE HCL 25 MG PO CAPS: 25.0000 mg | ORAL_CAPSULE | Freq: Four times a day (QID) | ORAL | Status: DC | PRN

## 2012-03-09 MED ORDER — VITAMIN B-1 100 MG PO TABS
100.0000 mg | ORAL_TABLET | Freq: Every day | ORAL | Status: DC
  Administered 2012-03-10 – 2012-03-13 (×4): 100 mg via ORAL
  Filled 2012-03-09 (×6): qty 1

## 2012-03-09 MED ORDER — NAPROXEN 500 MG PO TABS
500.0000 mg | ORAL_TABLET | Freq: Two times a day (BID) | ORAL | Status: DC | PRN
  Administered 2012-03-11 – 2012-03-12 (×2): 500 mg via ORAL
  Filled 2012-03-09 (×2): qty 1

## 2012-03-09 MED ORDER — ALUM & MAG HYDROXIDE-SIMETH 200-200-20 MG/5ML PO SUSP: 30.0000 mL | ORAL | Status: DC | PRN

## 2012-03-09 MED ORDER — HYDROXYZINE HCL 25 MG PO TABS: 25.0000 mg | ORAL_TABLET | Freq: Four times a day (QID) | ORAL | Status: DC | PRN

## 2012-03-09 MED ORDER — CLONIDINE HCL 0.1 MG PO TABS
0.1000 mg | ORAL_TABLET | Freq: Four times a day (QID) | ORAL | Status: AC
  Administered 2012-03-09 – 2012-03-11 (×10): 0.1 mg via ORAL
  Filled 2012-03-09 (×11): qty 1

## 2012-03-09 MED ORDER — ONDANSETRON 4 MG PO TBDP: 4.0000 mg | ORAL_TABLET | Freq: Four times a day (QID) | ORAL | Status: DC | PRN

## 2012-03-09 MED ORDER — FLAVOXATE HCL 100 MG PO TABS
100.0000 mg | ORAL_TABLET | Freq: Four times a day (QID) | ORAL | Status: DC
  Administered 2012-03-09 – 2012-03-13 (×16): 100 mg via ORAL
  Filled 2012-03-09 (×24): qty 1

## 2012-03-09 MED ORDER — DULOXETINE HCL 30 MG PO CPEP
30.0000 mg | ORAL_CAPSULE | Freq: Every day | ORAL | Status: DC
  Administered 2012-03-09: 30 mg via ORAL
  Filled 2012-03-09: qty 1

## 2012-03-09 MED ORDER — HYDROXYZINE HCL 25 MG PO TABS
25.0000 mg | ORAL_TABLET | Freq: Every day | ORAL | Status: DC
  Administered 2012-03-09 – 2012-03-10 (×3): 25 mg via ORAL
  Filled 2012-03-09 (×5): qty 1

## 2012-03-09 MED ORDER — CHLORDIAZEPOXIDE HCL 25 MG PO CAPS
25.0000 mg | ORAL_CAPSULE | Freq: Three times a day (TID) | ORAL | Status: AC
  Administered 2012-03-10 – 2012-03-11 (×3): 25 mg via ORAL
  Filled 2012-03-09 (×3): qty 1

## 2012-03-09 MED ORDER — CHLORDIAZEPOXIDE HCL 25 MG PO CAPS: 25.0000 mg | ORAL_CAPSULE | Freq: Every day | ORAL | Status: DC

## 2012-03-09 MED ORDER — CLONIDINE HCL 0.1 MG PO TABS
0.1000 mg | ORAL_TABLET | Freq: Every day | ORAL | Status: DC
  Filled 2012-03-09 (×2): qty 1

## 2012-03-09 MED ORDER — HYDROXYZINE HCL 25 MG PO TABS
25.0000 mg | ORAL_TABLET | Freq: Four times a day (QID) | ORAL | Status: DC | PRN
  Administered 2012-03-11 – 2012-03-13 (×5): 25 mg via ORAL

## 2012-03-09 MED ORDER — PANTOPRAZOLE SODIUM 40 MG PO TBEC
40.0000 mg | DELAYED_RELEASE_TABLET | Freq: Every day | ORAL | Status: DC
  Administered 2012-03-09 – 2012-03-13 (×5): 40 mg via ORAL
  Filled 2012-03-09 (×7): qty 1

## 2012-03-09 MED ORDER — DULOXETINE HCL 60 MG PO CPEP
60.0000 mg | ORAL_CAPSULE | Freq: Every day | ORAL | Status: DC
  Administered 2012-03-10 – 2012-03-13 (×4): 60 mg via ORAL
  Filled 2012-03-09 (×5): qty 1
  Filled 2012-03-09: qty 7

## 2012-03-09 MED ORDER — CHLORDIAZEPOXIDE HCL 25 MG PO CAPS
50.0000 mg | ORAL_CAPSULE | Freq: Once | ORAL | Status: AC
  Administered 2012-03-09: 50 mg via ORAL
  Filled 2012-03-09: qty 2

## 2012-03-09 MED ORDER — DICYCLOMINE HCL 20 MG PO TABS
20.0000 mg | ORAL_TABLET | ORAL | Status: DC | PRN
  Administered 2012-03-10: 10 mg via ORAL
  Administered 2012-03-11 – 2012-03-12 (×3): 20 mg via ORAL
  Filled 2012-03-09 (×4): qty 1

## 2012-03-09 MED ORDER — MAGNESIUM HYDROXIDE 400 MG/5ML PO SUSP: 30.0000 mL | Freq: Every day | ORAL | Status: DC | PRN

## 2012-03-09 MED ORDER — ELETRIPTAN HYDROBROMIDE 20 MG PO TABS
40.0000 mg | ORAL_TABLET | ORAL | Status: DC | PRN
  Administered 2012-03-10: 40 mg via ORAL
  Filled 2012-03-09: qty 2

## 2012-03-09 MED ORDER — ADULT MULTIVITAMIN W/MINERALS CH
3.0000 | ORAL_TABLET | Freq: Every day | ORAL | Status: DC
  Administered 2012-03-10: 1 via ORAL
  Administered 2012-03-11 – 2012-03-12 (×2): 3 via ORAL
  Administered 2012-03-13: 1 via ORAL
  Filled 2012-03-09 (×6): qty 3

## 2012-03-09 MED ORDER — ACETAMINOPHEN 325 MG PO TABS
650.0000 mg | ORAL_TABLET | ORAL | Status: DC | PRN
  Administered 2012-03-09: 650 mg via ORAL
  Filled 2012-03-09: qty 2

## 2012-03-09 MED ORDER — QUETIAPINE FUMARATE 50 MG PO TABS
50.0000 mg | ORAL_TABLET | Freq: Three times a day (TID) | ORAL | Status: DC | PRN
  Administered 2012-03-11 – 2012-03-12 (×3): 50 mg via ORAL
  Filled 2012-03-09 (×3): qty 1

## 2012-03-09 MED ORDER — THIAMINE HCL 100 MG/ML IJ SOLN: 100.0000 mg | Freq: Once | INTRAMUSCULAR | Status: DC

## 2012-03-09 MED ORDER — PENTOSAN POLYSULFATE SODIUM 100 MG PO CAPS
100.0000 mg | ORAL_CAPSULE | Freq: Three times a day (TID) | ORAL | Status: DC
  Filled 2012-03-09 (×3): qty 1

## 2012-03-09 MED ORDER — DICYCLOMINE HCL 10 MG PO CAPS
10.0000 mg | ORAL_CAPSULE | Freq: Three times a day (TID) | ORAL | Status: DC
  Filled 2012-03-09 (×2): qty 1

## 2012-03-09 MED ORDER — QUETIAPINE FUMARATE 100 MG PO TABS: 100.0000 mg | ORAL_TABLET | Freq: Every day | ORAL | Status: DC

## 2012-03-09 MED ORDER — PANTOPRAZOLE SODIUM 40 MG PO TBEC: 40.0000 mg | DELAYED_RELEASE_TABLET | Freq: Every day | ORAL | Status: DC

## 2012-03-09 MED ORDER — CLONAZEPAM 0.5 MG PO TABS: 0.5000 mg | ORAL_TABLET | Freq: Three times a day (TID) | ORAL | Status: DC | PRN

## 2012-03-09 MED ORDER — METHOCARBAMOL 500 MG PO TABS
500.0000 mg | ORAL_TABLET | Freq: Three times a day (TID) | ORAL | Status: DC | PRN
  Administered 2012-03-12 – 2012-03-13 (×3): 500 mg via ORAL
  Filled 2012-03-09 (×3): qty 1

## 2012-03-09 MED ORDER — CLONIDINE HCL 0.1 MG PO TABS
0.1000 mg | ORAL_TABLET | ORAL | Status: DC
  Administered 2012-03-10 – 2012-03-13 (×3): 0.1 mg via ORAL
  Filled 2012-03-09 (×4): qty 1

## 2012-03-09 NOTE — BH Assessment (Signed)
Patient accepted to [facility] by Seward Grater, NP to Dr. Lupe Carney. Writer informed EDP-Dr.Rhea of patients disposition. Patients nurse Kathlene November was made aware and will call report. He will also make the appropriate transportation arrangements.

## 2012-03-09 NOTE — ED Notes (Signed)
PA in room to see pt 

## 2012-03-09 NOTE — BH Assessment (Addendum)
Assessment Note   Penny [name] is a 26 y.o. female who presents to [facility] ED voluntarily, with family, endorsing SI and SA. Pt's mother, [name] ([phone]) and pt's Alonna Buckler Sporer [phone]), were at bedside during assessment, at the request of the pt. Family reports pt has been verbally endorsing SI, making threats of wanting to kill herself, for the past 48 hours. Mother reports this is the first time pt has endorsed SI. Family states pt was diagnosed with Interstitial cystitis approximately 3 years ago, shortly after the birth of her son. Since that time pt has reportedly been prescribed numerous pain medications. Family and pt state that she has become dependent on mediation and frequently abuses several of the medications including Valium and Percocet. Pt reports she first began having symptoms of depression after her son was born. Pt reports over the past year she has had an increase in depressive symptoms including irritability, isolation, fatigue, anhedonia, an increase in sleep, decrease in appetite, and vegative symptoms including staying in bed. Mother reports there has been a change in pt's personality, stating pt use to be very outgoing and cheerful. Pt reports she has tried several antidepressants but does not feel like they not alleviated depressive symptoms. Pt reports she is currently prescribed Cymbalta 300 mg 3 times daily. Patient states she is complaint with medication. . Pt also endorses anxiety, stating she has panic attacks frequently. She reports when she feels overwhelmed and stress and she has more panic attacks, sometimes experiencing them daily.   Pt denies HI and [facility]. Pt reports she lives with her parents and her son. Pt is unable to contract for safety at this time. Mother also reports concerns about pt's safety, stating she has access to numerous pain medications. Pt identifies her fiance and son has reasons for seeking treatment. Pt reports no prior inpatient  mental health treatment and no current psychiatrist or outpatient therapist.   Pt is seeking detox from Benzodiazepines as well as treatment for depressive symptoms and SI.            Axis I: Anxiety Disorder NOS, Major Depression, Recurrent severe and Benzodiazepine Dependence  Axis II: Deferred Axis III:  Past Medical History  Diagnosis Date  . Migraines   . Interstitial cystitis   . IUD FEB 2010  . Gastritis JULY 2011  . Depression   . 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 Fatty liver   . Obesity (BMI 30-39.9) 2011 228 LBS BMI 36.8  . Iron deficiency anemia 07/23/2010  . Irritable bowel syndrome 2012 DIARRHEA    JUN 2012 TTG IgA 14.9  . Elevated liver enzymes JUL 2011ALK PHOS 111-127 AST  143-267 ALT  213-321T BILI 0.6  ALB  3.7-4.06 Jun 2011 ALK PHOS 118 AST 24 ALT 42 T BILI 0.4 ALB 3.9  . Chronic daily headache    Axis IV: other psychosocial or environmental problems Axis V: 31-40 impairment in reality testing  Past Medical History:  Past Medical History  Diagnosis Date  . Migraines   . Interstitial cystitis   . IUD FEB 2010  . Gastritis JULY 2011  . Depression   . 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 Fatty liver   . Obesity (BMI 30-39.9) 2011 228 LBS BMI 36.8  . Iron deficiency anemia 07/23/2010  . Irritable bowel syndrome 2012 DIARRHEA    JUN 2012 TTG IgA 14.9  . Elevated liver enzymes JUL  2011ALK PHOS 111-127 AST  143-267 ALT  213-321T BILI 0.6  ALB  3.7-4.06 Jun 2011 ALK PHOS 118 AST 24 ALT 42 T BILI 0.4 ALB 3.9  . Chronic daily headache     Past Surgical History  Procedure Date  . Gab 2007    in [place]-POUCH 5 CM  . Cholecystectomy 2005    biliary dyskinesia  . Cath self every nite     for sodium bicarb injection  . Colonoscopy JUN 2012 ABD PN/DIARRHEA WITH PROPOFOL    NL COLON  . Upper gastrointestinal endoscopy JULY 2011 NAUSEA-D125,V6, PH 25    Bx; GASTRITIS, POUCH-5 CM LONG  . Dilation and curettage of uterus     Family History:   Family History  Problem Relation Age of Onset  . Hemochromatosis Maternal Grandmother   . Migraines Maternal Grandmother   . Cancer Maternal Grandmother   . Hypertension Father   . Diabetes Father   . Coronary artery disease Father   . Migraines Paternal Grandmother   . Cancer Mother   . Coronary artery disease Paternal Grandfather     Social History:  reports that she has been smoking Cigarettes.  She has been smoking about .5 packs per day. She does not have any smokeless tobacco history on file. She reports that she does not drink alcohol or use illicit drugs.  Additional Social History:  Alcohol / Drug Use History of alcohol / drug use?: Yes Substance #1 Name of Substance 1: benzodiazepines 1 - Age of First Use: 22 1 - Frequency: daily 1 - Duration: 3 years 1 - Last Use / Amount: 03/08/12 Allergies:  Allergies  Allergen Reactions  . Adhesive (Tape)   . Codeine     VOMTIING  . Gabapentin     UNRESPONSIVE BUT VITALS STABLE  . Latex     RASH  . Reglan     FEELS LIKE SHE'S IN A BOX AND CAN'T GET OUT    Home Medications:  Medications Prior to Admission  Medication Dose Route Frequency Provider Last Rate Last Dose  . acetaminophen (TYLENOL) tablet 650 mg  650 mg Oral Q4H PRN Roma Kayser Schorr, NP      . alum & mag hydroxide-simeth (MAALOX/MYLANTA) 200-200-20 MG/5ML suspension 30 mL  30 mL Oral PRN Roma Kayser Schorr, NP      . ibuprofen (ADVIL,MOTRIN) tablet 600 mg  600 mg Oral Q8H PRN Roma Kayser Schorr, NP      . ondansetron (ZOFRAN) tablet 4 mg  4 mg Oral Q8H PRN Leanne Chang, NP       Medications Prior to Admission  Medication Sig Dispense Refill  . amphetamine-dextroamphetamine (ADDERALL XR) 20 MG 24 hr capsule Take 20 mg by mouth every morning.      Marland Kitchen BIOTIN PO Take 1 tablet by mouth daily.      . cephALEXin (KEFLEX) 500 MG capsule Take 1,000 mg by mouth 2 (two) times daily.      . clonazePAM (KLONOPIN) 1 MG tablet Take 1 mg by mouth 3 (three) times daily.  Anxiety      . diazepam (VALIUM) 10 MG tablet Take 10 mg by mouth as needed. Anxiety attacks      . dicyclomine (BENTYL) 10 MG capsule 1 PO QAC AND HS      . eletriptan (RELPAX) 40 MG tablet One tablet by mouth as needed for migraine headache.  If the headache improves and then returns, dose may be repeated after 2  hours have elapsed since first dose (do not exceed 80 mg per day). may repeat in 2 hours if necessary       . ferumoxytol (FERAHEME) 510 MG/17ML SOLN Inject 510 mg into the vein every 6 (six) months. Iron Levels      . flavoxATE (URISPAS) 100 MG tablet Take 100 mg by mouth 4 (four) times daily as needed.      Marland Kitchen levonorgestrel (MIRENA) 20 MCG/24HR IUD 1 each by Intrauterine route once.        . loratadine (CLARITIN) 10 MG tablet Take 10 mg by mouth at bedtime.       Marland Kitchen omeprazole (PRILOSEC) 20 MG capsule Take 20 mg by mouth 2 (two) times daily.      Marland Kitchen oxyCODONE-acetaminophen (PERCOCET) 5-325 MG per tablet Take 1 tablet by mouth every 4 (four) hours as needed for pain.  15 tablet  0  . QUEtiapine (SEROQUEL) 100 MG tablet Take 100 mg by mouth at bedtime.        OB/GYN Status:  Patient's last menstrual period was 03/06/2012.  General Assessment Data Location of Assessment: [facility] ED Living Arrangements: Children;Parent (with her parents and 31 year old son) Can pt return to current living arrangement?: Yes Admission Status: Voluntary Is patient capable of signing voluntary admission?: Yes Transfer from: Acute Hospital Referral Source: Self/Family/Friend  Education Status Is patient currently in school?: Yes Highest grade of school patient has completed: 39 Name of school:  (Paramedics course work)  Risk to self Suicidal Ideation: Yes-Currently Present Suicidal Intent: Yes-Currently Present Is patient at risk for suicide?: Yes Suicidal Plan?: No (pt states no plan but "I know how to do it if i want to") Access to Means: Yes Specify Access to Suicidal Means: pain medication What  has been your use of drugs/alcohol within the last 12 months?: benzodiazepine Previous Attempts/Gestures: No How many times?: 0  Other Self Harm Risks: none Triggers for Past Attempts:  (chronic illness) Intentional Self Injurious Behavior: None Family Suicide History: No Recent stressful life event(s):  (chronic illness that is worsened by recent stress) Persecutory voices/beliefs?: No Depression: Yes Depression Symptoms: Despondent;Loss of interest in usual pleasures;Feeling angry/irritable;Fatigue;Isolating Substance abuse history and/or treatment for substance abuse?: Yes Suicide prevention information given to non-admitted patients: Not applicable  Risk to Others Homicidal Ideation: No Thoughts of Harm to Others: No Current Homicidal Intent: No Current Homicidal Plan: No Access to Homicidal Means: No Identified Victim: none History of harm to others?: No Assessment of Violence: None Noted Violent Behavior Description: none Does patient have access to weapons?: No Criminal Charges Pending?: No Does patient have a court date: No  Psychosis Hallucinations: None noted Delusions: None noted  Mental Status Report Appear/Hygiene: Disheveled Eye Contact: Good Motor Activity: Unremarkable Speech: Logical/coherent Level of Consciousness: Alert Mood: Depressed;Anxious Affect: Depressed;Anxious Anxiety Level: Panic Attacks Panic attack frequency: reports depends on stress levels, sometimes daily Most recent panic attack: 03/08/12 Thought Processes: Coherent;Relevant Judgement: Impaired Orientation: Person;Place;Time;Situation Obsessive Compulsive Thoughts/Behaviors: None  Cognitive Functioning Concentration: Normal Memory: Recent Intact;Remote Intact IQ: Average Insight: Fair Impulse Control: Poor Appetite: Poor Weight Loss: 0  Weight Gain: 0  Sleep: Increased Vegetative Symptoms: Staying in bed  Prior Inpatient Therapy Prior Inpatient Therapy: No Prior Therapy  Dates: n/a Prior Therapy Facilty/Provider(s): n/a Reason for Treatment: n/a  Prior Outpatient Therapy Prior Outpatient Therapy: Yes [facility] Reason for Treatment: depression  ADL Screening (condition at time of admission) Patient's cognitive ability adequate to safely complete  daily activities?: Yes Patient able to express need for assistance with ADLs?: Yes Independently performs ADLs?: Yes  Home Assistive Devices/Equipment Home Assistive Devices/Equipment: None    Abuse/Neglect Assessment (Assessment to be complete while patient is alone) Physical Abuse: Denies Verbal Abuse: Denies Sexual Abuse: Denies Exploitation of patient/patient's resources: Denies Self-Neglect: Denies Values / Beliefs Cultural Requests During Hospitalization: None Spiritual Requests During Hospitalization: None   Advance Directives (For Healthcare) Advance Directive: Patient does not have advance directive Nutrition Screen Diet: Regular Unintentional weight loss greater than 10lbs within the last month: No Home Tube Feeding or Total Parenteral Nutrition (TPN): No Patient appears severely malnourished: No Pregnant or Lactating: No  Additional Information 1:1 In Past 12 Months?: No CIRT Risk: No Elopement Risk: No Does patient have medical clearance?: Yes     Disposition:  Disposition Disposition of Patient: Referred to;Inpatient treatment program Type of inpatient treatment program: Adult  On Site Evaluation by:   Reviewed with Physician:     Georgina Quint A 03/09/2012 3:03 AM

## 2012-03-09 NOTE — Progress Notes (Signed)
Pt is pleasant and cooperative. Pt thinks that her nights medication will not put her to sleep. Pt was told by RN to let her medication take affect and then come back if she needed something else. Pt did attend groups. Pt was offered support and encouragement. Pt is receptive to treatment and safety maintained on unit.

## 2012-03-09 NOTE — ED Provider Notes (Signed)
Medical screening examination/treatment/procedure(s) were performed by non-physician practitioner and as supervising physician I was immediately available for consultation/collaboration.   Carlisle Beers Cynde Menard, MD 03/09/12 0700

## 2012-03-09 NOTE — ED Notes (Signed)
ACT team member in to see pt. 

## 2012-03-09 NOTE — ED Provider Notes (Signed)
History     CSN: [id number]  Arrival date & time 03/08/12  2206   First MD Initiated Contact with Patient 03/09/12 0147      Chief Complaint  Patient presents with  . Medical Clearance     Patient is a 26 y.o. female presenting with mental health disorder. The history is provided by the patient and a parent.  Mental Health Problem The current episode started more than 1 month ago. This is a chronic problem.  The degree of incapacity that she is experiencing as a consequence of her illness is moderate. Additional symptoms of the illness include hypersomnia. She admits to suicidal ideas. She does have a plan to commit suicide. She contemplates harming herself. She has not already injured self. She does not contemplate injuring another person. She has not already  injured another person. Risk factors that are present for mental illness include a history of mental illness, a family history of mental illness and substance abuse.  patient reports "fighting depression" for 3 years. States her depression has grown increasingly worse over the last 2-3 months significantly worse over the last 48 hours. States that she has had suicidal falls intermittently for 2 months worse over the last 24 hours. States she has a plan of cutting herself on her wrist. Admits that she abuses pain pills. [facility] has had her on numerous medications for depression with minimal help. Family had her brought here tonight due to concern for suicidal ideation. Patient denies any physical complaints at this time, recent history of "Arna Medici virus last week".  Past Medical History  Diagnosis Date  . Migraines   . Interstitial cystitis   . IUD FEB 2010  . Gastritis JULY 2011  . Depression   . 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 Fatty liver   . Obesity (BMI 30-39.9) 2011 228 LBS BMI 36.8  . Iron deficiency anemia 07/23/2010  . Irritable bowel syndrome 2012 DIARRHEA    JUN 2012 TTG IgA  14.9  . Elevated liver enzymes JUL 2011ALK PHOS 111-127 AST  143-267 ALT  213-321T BILI 0.6  ALB  3.7-4.06 Jun 2011 ALK PHOS 118 AST 24 ALT 42 T BILI 0.4 ALB 3.9  . Chronic daily headache     Past Surgical History  Procedure Date  . Gab 2007    in [place]-POUCH 5 CM  . Cholecystectomy 2005    biliary dyskinesia  . Cath self every nite     for sodium bicarb injection  . Colonoscopy JUN 2012 ABD PN/DIARRHEA WITH PROPOFOL    NL COLON  . Upper gastrointestinal endoscopy JULY 2011 NAUSEA-D125,V6, PH 25    Bx; GASTRITIS, POUCH-5 CM LONG  . Dilation and curettage of uterus     Family History  Problem Relation Age of Onset  . Hemochromatosis Maternal Grandmother   . Migraines Maternal Grandmother   . Cancer Maternal Grandmother   . Hypertension Father   . Diabetes Father   . Coronary artery disease Father   . Migraines Paternal Grandmother   . Cancer Mother   . Coronary artery disease Paternal Grandfather     History  Substance Use Topics  . Smoking status: Current Everyday Smoker -- 0.5 packs/day    Types: Cigarettes  . Smokeless tobacco: Not on file  . Alcohol Use: No    OB History    Grav Para Term Preterm Abortions TAB SAB Ect Mult  Living   1 1 1       1       Review of Systems  Constitutional: Negative.   HENT: Negative.   Eyes: Negative.   Respiratory: Negative.   Cardiovascular: Negative.   Gastrointestinal: Negative.   Genitourinary: Negative.   Musculoskeletal: Negative.   Skin: Negative.   Neurological: Negative.   Hematological: Negative.   Psychiatric/Behavioral: Negative.     Allergies  Adhesive; Codeine; Gabapentin; Latex; and Reglan  Home Medications   Current Outpatient Rx  Name Route Sig Dispense Refill  . ACETAMINOPHEN 500 MG PO TABS Oral Take 500 mg by mouth every 6 (six) hours as needed.    . AMPHETAMINE-DEXTROAMPHET ER 20 MG PO CP24 Oral Take 20 mg by mouth every morning.    Marland Kitchen BIOTIN PO Oral Take 1 tablet by mouth daily.    .  CEPHALEXIN 500 MG PO CAPS Oral Take 1,000 mg by mouth 2 (two) times daily.    Marland Kitchen CLOBETASOL PROPIONATE 0.05 % EX CREA Topical Apply topically 2 (two) times daily.    Marland Kitchen CLONAZEPAM 1 MG PO TABS Oral Take 1 mg by mouth 3 (three) times daily. Anxiety    . DIAZEPAM 10 MG PO TABS Oral Take 10 mg by mouth as needed. Anxiety attacks    . DICYCLOMINE HCL 10 MG PO CAPS  1 PO QAC AND HS    . DIPHENHYDRAMINE HCL 25 MG PO CAPS Oral Take 100 mg by mouth at bedtime as needed.    . DULOXETINE HCL 30 MG PO CPEP Oral Take 60 mg by mouth 2 (two) times daily.     Marland Kitchen ELETRIPTAN HYDROBROMIDE 40 MG PO TABS Oral One tablet by mouth as needed for migraine headache.  If the headache improves and then returns, dose may be repeated after 2 hours have elapsed since first dose (do not exceed 80 mg per day). may repeat in 2 hours if necessary     . FERUMOXYTOL INJECTION 510 MG/17 ML Intravenous Inject 510 mg into the vein every 6 (six) months. Iron Levels    . FLAVOXATE HCL 100 MG PO TABS Oral Take 100 mg by mouth 4 (four) times daily as needed.    Marland Kitchen FLUCONAZOLE 100 MG PO TABS Oral Take 100 mg by mouth as needed.    Marland Kitchen HYDROXYZINE HCL 25 MG PO TABS Oral Take 25 mg by mouth at bedtime.    . IBUPROFEN 200 MG PO TABS Oral Take 600 mg by mouth every 6 (six) hours as needed.    Marland Kitchen LEVONORGESTREL 20 MCG/24HR IU IUD Intrauterine 1 each by Intrauterine route once.      Marland Kitchen LORATADINE 10 MG PO TABS Oral Take 10 mg by mouth at bedtime.     . NYSTATIN-TRIAMCINOLONE 100000-0.1 UNIT/GM-% EX CREA Topical Apply topically 3 (three) times daily.    Marland Kitchen OMEPRAZOLE 20 MG PO CPDR Oral Take 20 mg by mouth 2 (two) times daily.    . OXYCODONE-ACETAMINOPHEN 5-325 MG PO TABS Oral Take 1 tablet by mouth every 4 (four) hours as needed for pain. 15 tablet 0  . PENTOSAN POLYSULFATE SODIUM 100 MG PO CAPS Oral Take 100 mg by mouth 3 (three) times daily before meals. Dmso treatments bladder instillation Injected into bladder by her dr    . QUETIAPINE FUMARATE 100 MG  PO TABS Oral Take 100 mg by mouth at bedtime.      BP 136/88  Pulse 95  Temp(Src) 99.4 F (37.4 C) (Oral)  Resp 18  SpO2 99%  LMP 03/06/2012  Physical Exam  Constitutional: She is oriented to person, place, and time. She appears well-developed and well-nourished.  HENT:  Head: Normocephalic and atraumatic.  Eyes: Conjunctivae are normal.  Neck: Neck supple.  Cardiovascular: Normal rate and regular rhythm.   Pulmonary/Chest: Effort normal and breath sounds normal.  Abdominal: Soft. Bowel sounds are normal.  Musculoskeletal: Normal range of motion.  Neurological: She is alert and oriented to person, place, and time.  Skin: Skin is warm and dry. No erythema.  Psychiatric: She has a normal mood and affect.    ED Course  Procedures  Patient has been medically screened and cleared. Will consult with Act Team regarding patient evaluation for possible inpatient placement. I have spoken w/ Judeth Cornfield w/ Act Team who has agreed to eval pt. Will plan to transfer pt to psych ED hold.  [id number] Cornfield Act Team states that she believes the patient needs inpatient placement. She will be working on bed availability.  Labs Reviewed  CBC - Abnormal; Notable for the following:    WBC 11.2 (*)    All other components within normal limits  URINE RAPID DRUG SCREEN (HOSP PERFORMED) - Abnormal; Notable for the following:    Benzodiazepines POSITIVE (*)    All other components within normal limits  URINALYSIS, ROUTINE W REFLEX MICROSCOPIC - Abnormal; Notable for the following:    Ketones, ur TRACE (*)    All other components within normal limits  BASIC METABOLIC PANEL  ETHANOL  PREGNANCY, URINE   No results found.   No diagnosis found.    MDM  HPI/PE and clinical findings c/w 1. Suicidal ideation (patient reports plan of cutting her wrist) 2. Depression 3. Opiate abuse        Leanne Chang, NP 03/09/12 [phone]

## 2012-03-09 NOTE — ED Provider Notes (Signed)
Patient resting comfortably. Requested home meds.  GI meds and cymbalta and seroquel placed on standing orders with clonopin.  Narcotics, valium, and adderall held.    Hilario Quarry, MD 03/09/12 [phone]

## 2012-03-09 NOTE — H&P (Signed)
Psychiatric Admission Assessment Adult  Patient Identification:  Penny [name] Date of Evaluation:  03/09/2012 Chief Complaint:  MDD  ANXIETY DISORDER  [facility] History of Present Illness: Secilia presented to the ED requesting help for her depression and polysubstance addiction.  She is using as many as 10 or more oxycodone, or hydrocodone as well as taking Klonopin, xanax, and adderall.  She has a history of ADHD, but doesn't take her stimulants everyday.  She is getting narcotics from a number of sources.  She does not inject her medication.  Psychiatric Symptoms:  Sadness, fatigue, worry, wothlessness, hopelessness, anhedonia. Hx of Trauma: (Emotional/Phsycial/Sexual) denies Past Psychiatric History: ADHD, depression Past Medical History:   Past Medical History  Diagnosis Date  . Migraines   . Interstitial cystitis   . IUD FEB 2010  . Gastritis JULY 2011  . Depression   . 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 Fatty liver   . Obesity (BMI 30-39.9) 2011 228 LBS BMI 36.8  . Iron deficiency anemia 07/23/2010  . Irritable bowel syndrome 2012 DIARRHEA    JUN 2012 TTG IgA 14.9  . Elevated liver enzymes JUL 2011ALK PHOS 111-127 AST  143-267 ALT  213-321T BILI 0.6  ALB  3.7-4.06 Jun 2011 ALK PHOS 118 AST 24 ALT 42 T BILI 0.4 ALB 3.9  . Chronic daily headache    Allergies:   Allergies  Allergen Reactions  . Adhesive (Tape)   . Codeine     VOMTIING  . Gabapentin     UNRESPONSIVE BUT VITALS STABLE  . Latex     RASH  . Reglan     FEELS LIKE SHE'S IN A BOX AND CAN'T GET OUT    PTA Medications: Prescriptions prior to admission  Medication Sig Dispense Refill  . amphetamine-dextroamphetamine (ADDERALL XR) 20 MG 24 hr capsule Take 20 mg by mouth every morning.      Marland Kitchen BIOTIN PO Take 1 tablet by mouth daily.      . clobetasol cream (TEMOVATE) 0.05 % Apply topically 2 (two) times daily.      . clonazePAM (KLONOPIN) 1 MG tablet Take 1 mg by mouth 3 (three) times daily. Anxiety       . diazepam (VALIUM) 10 MG tablet Take 10 mg by mouth as needed. Anxiety attacks      . dicyclomine (BENTYL) 10 MG capsule 1 PO QAC AND HS      . diphenhydrAMINE (BENADRYL) 25 mg capsule Take 100 mg by mouth at bedtime as needed.      . DULoxetine (CYMBALTA) 30 MG capsule Take 60 mg by mouth 2 (two) times daily.       Marland Kitchen eletriptan (RELPAX) 40 MG tablet One tablet by mouth as needed for migraine headache.  If the headache improves and then returns, dose may be repeated after 2 hours have elapsed since first dose (do not exceed 80 mg per day). may repeat in 2 hours if necessary       . ferumoxytol (FERAHEME) 510 MG/17ML SOLN Inject 510 mg into the vein every 6 (six) months. Iron Levels      . flavoxATE (URISPAS) 100 MG tablet Take 100 mg by mouth 4 (four) times daily as needed.      . fluconazole (DIFLUCAN) 100 MG tablet Take 100 mg by mouth as needed.      . hydrOXYzine (ATARAX/VISTARIL) 25 MG tablet Take 25 mg by mouth at bedtime.      Marland Kitchen  ibuprofen (ADVIL,MOTRIN) 200 MG tablet Take 600 mg by mouth every 6 (six) hours as needed.      Marland Kitchen levonorgestrel (MIRENA) 20 MCG/24HR IUD 1 each by Intrauterine route once.        . loratadine (CLARITIN) 10 MG tablet Take 10 mg by mouth at bedtime.       Marland Kitchen nystatin-triamcinolone (MYCOLOG II) cream Apply topically 3 (three) times daily.      Marland Kitchen omeprazole (PRILOSEC) 20 MG capsule Take 20 mg by mouth 2 (two) times daily.      Marland Kitchen oxyCODONE-acetaminophen (PERCOCET) 5-325 MG per tablet Take 1 tablet by mouth every 4 (four) hours as needed for pain.  15 tablet  0  . pentosan polysulfate (ELMIRON) 100 MG capsule Take 100 mg by mouth 3 (three) times daily before meals. Dmso treatments bladder instillation Injected into bladder by her dr      . QUEtiapine (SEROQUEL) 100 MG tablet Take 100 mg by mouth at bedtime.      Marland Kitchen DISCONTD: acetaminophen (TYLENOL) 500 MG tablet Take 500 mg by mouth every 6 (six) hours as needed.      Marland Kitchen DISCONTD: cephALEXin (KEFLEX) 500 MG capsule Take  1,000 mg by mouth 2 (two) times daily.        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 Hemochromatosis Maternal Grandmother   . Migraines Maternal Grandmother   . Cancer Maternal Grandmother   . Hypertension Father   . Diabetes Father   . Coronary artery disease Father   . Migraines Paternal Grandmother   . Cancer Mother   . Coronary artery disease Paternal Grandfather     ROS: As noted in the HPI. PE: Completed in ED. Pt evaluated and results reviewed. Mental Status Examination/Evaluation: Appearance: casual  Eye Contact::  fair  Speech:  clear  Volume:  Normal  Mood:  depressed  Affect:  tearful  Thought Process:  linear  Orientation:  x3  Thought Content:  linear  Suicidal Thoughts:  + SI/no plans  Homicidal Thoughts:  no  Memory:  fair  Judgement:  poor  Insight:  lacking  Psychomotor Activity:  normal  Concentration:  fair  Recall:  fair  Akathisia:  no  Handed:  right  AIMS (if indicated):     Assets:  Desire for improvement.  Sleep:      Labs:Results for DAMESHA, LAWLER (MRN [id number]) as of 03/10/2012 00:20  Ref. Range 03/08/2012 23:55  Sodium Latest Range: 135-145 mEq/L 137  Potassium Latest Range: 3.5-5.1 mEq/L 4.2  Chloride Latest Range: 96-112 mEq/L 103  CO2 Latest Range: 19-32 mEq/L 22  BUN Latest Range: 6-23 mg/dL 10  Creat Latest Range: 0.50-1.10 mg/dL 4.09  Calcium Latest Range: 8.4-10.5 mg/dL 9.4  GFR calc non Af Amer Latest Range: >90 mL/min >90  GFR calc Af Amer Latest Range: >90 mL/min >90  Glucose Latest Range: 70-99 mg/dL 88  WBC Latest Range: 8.1-19.1 K/uL 11.2 (H)  RBC Latest Range: 3.87-5.11 MIL/uL 4.47  HGB  Latest Range: 12.0-15.0 g/dL 47.8  HCT Latest Range: 36.0-46.0 % 41.3  MCV Latest Range: 78.0-100.0 fL 92.4  MCH Latest Range: 26.0-34.0 pg 32.7  MCHC Latest Range: 30.0-36.0  g/dL 40.9  RDW Latest Range: 11.5-15.5 % 12.0  Platelets Latest Range: 150-400 K/uL 297  Alcohol, Ethyl (B) Latest Range: 0-11 mg/dL <81   Xray:  Assessment:    AXIS I:  Opiate addiction, benzodiazepine addiction AXIS II:  deferred AXIS III:   Past Medical History  Diagnosis Date  . Migraines   . Interstitial cystitis   . IUD FEB 2010  . Gastritis JULY 2011  . Depression   . 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 Fatty liver   . Obesity (BMI 30-39.9) 2011 228 LBS BMI 36.8  . Iron deficiency anemia 07/23/2010  . Irritable bowel syndrome 2012 DIARRHEA    JUN 2012 TTG IgA 14.9  . Elevated liver enzymes JUL 2011ALK PHOS 111-127 AST  143-267 ALT  213-321T BILI 0.6  ALB  3.7-4.06 Jun 2011 ALK PHOS 118 AST 24 ALT 42 T BILI 0.4 ALB 3.9  . Chronic daily headache    AXIS IV:  Problems related to her primary support group AXIS V:  GAF: 45  Treatment Plan/Recommendations: Admit for crisis stabilization and supportive care to include detox protocol for opiate dependence, benzodiazepine dependence as needed. Evaluation and treatment for medical problems associated with current state of health.  Treatment Plan Summary:  Current Medications:  Current Facility-Administered Medications  Medication Dose Route Frequency Provider Last Rate Last Dose  . acetaminophen (TYLENOL) tablet 650 mg  650 mg Oral Q6H PRN Viviann Spare, NP      . alum & mag hydroxide-simeth (MAALOX/MYLANTA) 200-200-20 MG/5ML suspension 30 mL  30 mL Oral Q4H PRN Viviann Spare, NP      . clonazePAM Scarlette Calico) tablet 0.5 mg  0.5 mg Oral TID PRN Viviann Spare, NP      . dicyclomine (BENTYL) capsule 10 mg  10 mg Oral TID AC & HS Viviann Spare, NP      . DULoxetine (CYMBALTA) DR capsule 60 mg  60 mg Oral Daily Viviann Spare, NP      .  eletriptan (RELPAX) tablet 40 mg  40 mg Oral PRN Viviann Spare, NP      . flavoxATE (URISPAS) tablet 100 mg  100 mg Oral QID PRN Viviann Spare, NP      . hydrOXYzine (ATARAX/VISTARIL) tablet 25 mg  25 mg Oral QHS Viviann Spare, NP      . magnesium hydroxide (MILK OF MAGNESIA) suspension 30 mL  30 mL Oral Daily PRN Viviann Spare, NP      . pantoprazole (PROTONIX) EC tablet 40 mg  40 mg Oral Q1200 Viviann Spare, NP      . QUEtiapine (SEROQUEL) tablet 200 mg  200 mg Oral QHS Viviann Spare, NP      . QUEtiapine (SEROQUEL) tablet 50 mg  50 mg Oral TID PRN Viviann Spare, NP       Facility-Administered Medications Ordered in Other Encounters  Medication Dose Route Frequency Provider Last Rate Last Dose  . DISCONTD: acetaminophen (TYLENOL) tablet 650 mg  650 mg Oral Q4H PRN Roma Kayser Schorr, NP   650 mg at 03/09/12 1112  . DISCONTD: alum & mag hydroxide-simeth (MAALOX/MYLANTA) 200-200-20 MG/5ML suspension 30 mL  30 mL Oral PRN Roma Kayser Schorr, NP      . DISCONTD: clonazePAM (KLONOPIN) tablet 1 mg  1 mg Oral TID PRN Hilario Quarry, MD      . DISCONTD: dicyclomine (BENTYL) capsule 10 mg  10 mg Oral TID AC Hilario Quarry, MD      . DISCONTD: DULoxetine (CYMBALTA) DR capsule 30 mg  30 mg Oral Daily Hilario Quarry, MD   30 mg at 03/09/12 0931  . DISCONTD: ibuprofen (ADVIL,MOTRIN) tablet 600 mg  600 mg Oral Q8H PRN Roma Kayser Schorr, NP      . DISCONTD: loratadine (CLARITIN) tablet 10 mg  10 mg Oral Daily Hilario Quarry, MD   10 mg at 03/09/12 0931  . DISCONTD: ondansetron (ZOFRAN) tablet 4 mg  4 mg Oral Q8H PRN Leanne Chang, NP      . DISCONTD: pantoprazole (PROTONIX) EC tablet 40 mg  40 mg Oral Q1200 Hilario Quarry, MD      . DISCONTD: pentosan polysulfate (ELMIRON) capsule 100 mg  100 mg Oral TID Hilario Quarry, MD      . DISCONTD: QUEtiapine (SEROQUEL) tablet 100 mg  100 mg Oral QHS Hilario Quarry, MD        Observation Level/Precautions:  routine  Laboratory:         Routine PRN Medications:  yes  Consultations:    Discharge Concerns:    Other:      Lloyd Huger T. Elayah Klooster PAC For Dr. Lupe Carney  4/4/20133:49 PM

## 2012-03-09 NOTE — Progress Notes (Signed)
03/09/2012         Time: 1415      Group Topic/Focus: The focus of this group is on enhancing the patient's understanding of leisure, barriers to leisure, and the importance of engaging in positive leisure activities upon discharge for improved total health.  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   Penny [name] 03/09/2012 3:04 PM

## 2012-03-09 NOTE — ED Notes (Signed)
Waiting for MD evaluation.

## 2012-03-09 NOTE — Progress Notes (Signed)
Patient ID: Penny [name], female   DOB: 22-Mar-1986, 26 y.o.   MRN: [id number] Pt is a voluntary admit. Pt states she is here due to increased depression and suicidal thoughts. PT said she has chronic migraines and interstitial cystitis and lives everyday in pain. T states that she has become immune to all her pain medication and has become tired of living in pain and wanted to end it. Pt states she has no plan. Pt states she knows she can not go through with killing herself because she has a beautiful three year old little boy. Pt is pleasant and cooperative. PT has had gastric bypass but states she does not need a special diet. Pt states that she is also feeling helpless, hopeless, and suffers from anxiety. Per report pt mother states pt has been acting differently and no longer is cheerful and out going. PT mother also stated he her daughter has been on several antidepressants. Pt  liveswith parents and will be returning back there when she lives. Pt mother feels that her daughter is abusing her pain medications.

## 2012-03-09 NOTE — ED Notes (Signed)
Pt arrives on unit and states she needs her pain meds. States she is immune to hydrocodone and needs to have three tabs of oxycodone 5/325 ordered. She also states that she needs 100mg  Benadryl, Klonopin, Keflex and Xanax ordered because she has been without them today. Refuses offer for Tylenol or Ibuprofen. MD aware.

## 2012-03-10 DIAGNOSIS — F909 Attention-deficit hyperactivity disorder, unspecified type: Secondary | ICD-10-CM

## 2012-03-10 DIAGNOSIS — F341 Dysthymic disorder: Secondary | ICD-10-CM | POA: Insufficient documentation

## 2012-03-10 DIAGNOSIS — F988 Other specified behavioral and emotional disorders with onset usually occurring in childhood and adolescence: Secondary | ICD-10-CM | POA: Insufficient documentation

## 2012-03-10 DIAGNOSIS — F410 Panic disorder [episodic paroxysmal anxiety] without agoraphobia: Secondary | ICD-10-CM | POA: Diagnosis present

## 2012-03-10 DIAGNOSIS — Z87898 Personal history of other specified conditions: Secondary | ICD-10-CM | POA: Insufficient documentation

## 2012-03-10 DIAGNOSIS — F900 Attention-deficit hyperactivity disorder, predominantly inattentive type: Secondary | ICD-10-CM | POA: Diagnosis present

## 2012-03-10 DIAGNOSIS — F1994 Other psychoactive substance use, unspecified with psychoactive substance-induced mood disorder: Principal | ICD-10-CM

## 2012-03-10 DIAGNOSIS — Z8669 Personal history of other diseases of the nervous system and sense organs: Secondary | ICD-10-CM

## 2012-03-10 DIAGNOSIS — R45851 Suicidal ideations: Secondary | ICD-10-CM

## 2012-03-10 DIAGNOSIS — F1920 Other psychoactive substance dependence, uncomplicated: Secondary | ICD-10-CM

## 2012-03-10 DIAGNOSIS — F445 Conversion disorder with seizures or convulsions: Secondary | ICD-10-CM | POA: Insufficient documentation

## 2012-03-10 DIAGNOSIS — F418 Other specified anxiety disorders: Secondary | ICD-10-CM | POA: Diagnosis present

## 2012-03-10 LAB — TSH: TSH: 1.082 u[IU]/mL (ref 0.350–4.500)

## 2012-03-10 LAB — T4, FREE: Free T4: 0.89 ng/dL (ref 0.80–1.80)

## 2012-03-10 LAB — T3, FREE: T3, Free: 2.7 pg/mL (ref 2.3–4.2)

## 2012-03-10 LAB — LIPID PANEL
LDL Cholesterol: 47 mg/dL (ref 0–99)
VLDL: 10 mg/dL (ref 0–40)

## 2012-03-10 MED ORDER — LORATADINE 10 MG PO TABS
10.0000 mg | ORAL_TABLET | Freq: Every day | ORAL | Status: DC
  Administered 2012-03-10 – 2012-03-13 (×4): 10 mg via ORAL
  Filled 2012-03-10 (×6): qty 1

## 2012-03-10 NOTE — Progress Notes (Signed)
[facility] Group Notes:  (Counselor/Nursing/MHT/Case Management/Adjunct)  03/10/2012 6:28 PM  Type of Therapy:  Group Therapy  Participation Level:  Did Not Attend   Clide Dales 03/10/2012, 6:28 PM

## 2012-03-10 NOTE — Progress Notes (Signed)
Pt did not attend d/c planning group on this date.  SW met with pt individually at this time.  Pt was resting in the bed.  Pt states that she has a migraine and has been unable to get up today.  Pt states that she is coming off of oxycodone and hydrocodone.  Pt states that she is open to long term treatment options.  Pt states that she lives in [place] with her parents and son and states that they are supportive.  Pt has access to transportation.  Pt reports high anxiety and depression today and denies SI.  SW will assess for appropriate referrals as pt becomes more stable.  No further needs voiced by pt at this time.    Reyes Ivan, LCSWA 03/10/2012  1:17 PM

## 2012-03-10 NOTE — [facility] Suicide Risk Assessment (Signed)
Suicide Risk Assessment  Admission Assessment     Demographic factors:  Assessment Details Time of Assessment: Admission Information Obtained From: Patient Current Mental Status:  Current Mental Status:  (Denies SI/HI) Loss Factors:  Loss Factors:  (n/a) Historical Factors:  Historical Factors: Family history of suicide Risk Reduction Factors:  Risk Reduction Factors: Positive social support;Living with another person, especially a relative;Employed;Sense of responsibility to family;Responsible for children under 81 years of age  CLINICAL FACTORS:   Severe Anxiety and/or Agitation Panic Attacks Depression:   Anhedonia Hopelessness Insomnia Severe Previous Psychiatric Diagnoses and Treatments  COGNITIVE FEATURES THAT CONTRIBUTE TO RISK:  Thought constriction (tunnel vision)    SUICIDE RISK:   Minimal: No identifiable suicidal ideation.  Patients presenting with no risk factors but with morbid ruminations; may be classified as minimal risk based on the severity of the depressive symptoms  PLAN OF CARE:  Pt is psychomotor retarded with slow, low volume speech.  She denies suicidal thoughts.  She has a severe migraine headache, has not had one in 6 months.  She has no appetite and cannot sleep.  She is to be monitored for suicidal thoughts, clarity of thinking and increased energy and motivation.  She has been depressed since postpartum depression 3 yrs ago.  She has a large supportive family.  Her son is hyperactive and so is her brother.  Her mother has a depression hixotry.  She will engage in individual and group therapy and re-establish mental health care in the community.    Penny [name] 03/10/2012, 12:22 PM

## 2012-03-10 NOTE — Progress Notes (Signed)
Cosigned by Carney Bern, LCSWA 4/5/20137:06 PM

## 2012-03-10 NOTE — Progress Notes (Signed)
Resting quietly with eyes closed. Respirations even and unlabored. No distress noted. Q 15 check continues to maintain safety.

## 2012-03-10 NOTE — Progress Notes (Signed)
Pt pleasant on approach, denies complaints.  Pt positive for group this evening.  Denies SI/HI/hallucinations.  Support and encouragement offered.  Will continue to monitor.

## 2012-03-10 NOTE — Progress Notes (Signed)
[facility] Group Notes:  (Counselor/Nursing/MHT/Case Management/Adjunct)  03/10/2012 2:50 PM  Type of Therapy:  1:15PM Group Therapy  Participation Level:  Did Not Attend  Wilmon Arms 03/10/2012, 2:50 PM

## 2012-03-11 DIAGNOSIS — F1120 Opioid dependence, uncomplicated: Secondary | ICD-10-CM | POA: Insufficient documentation

## 2012-03-11 NOTE — Progress Notes (Signed)
[facility] Adult Inpatient Family/Significant Other Suicide Prevention Education  Suicide Prevention Education:  Contact Attempts:  [name], Mother, [phone], mother, has been identified by the patient as the family member/significant other with whom the patient will be residing, and identified as the person(s) who will aid the patient in the event of a mental health crisis.  With written consent from the patient, two attempts were made to provide suicide prevention education, prior to and/or following the patient's discharge.  We were unsuccessful in providing suicide prevention education.  A suicide education pamphlet was given to the patient to share with family/significant other.  Date and time of first attempt:  03/11/2912  10:30am Date and time of second attempt:  03/10/2012  11:40am  Christen Butter 03/11/2012, 11:42 AM

## 2012-03-11 NOTE — Progress Notes (Signed)
[facility] Group Notes:  (Counselor/Nursing/MHT/Case Management/Adjunct)  03/11/2012 4:39 PM  Type of Therapy:  Group Therapy  Participation Level:  Active  Participation Quality:  Appropriate and Attentive  Affect:  Appropriate  Cognitive:  Appropriate  Insight:  Good  Engagement in Group:  Good  Engagement in Therapy:  Good  Modes of Intervention:  Activity, Clarification, Socialization and Support  Summary of Progress/Problems: Pt. participated in group discussion of self sabotaging behaviors and how they can each enable themselves in a positive way in over to overcome their self sabotaging behaviors. Pt. spoke about taking on more than she can handle in her daily life and how it triggers her drug use. Pt. spoke about enabling her self by taking care of self and volunteering outside of her family with different agencies.  Neila Gear 03/11/2012, 4:39 PM

## 2012-03-11 NOTE — Progress Notes (Signed)
Adult Comprehensive Assessment  Patient ID: Penny [name], female   DOB: 1986-03-09, 26 y.o.   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 ago - Good relationships with All other family  Financial / Lack of resources (include bankruptcy): Depends on parents  Housing / Lack of housing: Safe Physical health (include injuries & life threatening diseases): Chronic Migraines, and Anemia - has infusions periodically.  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 old son How long has patient lived in current situation?: 8 months ago What is atmosphere in current home: Loving;Supportive;Temporary  Family History:  Marital status: Divorced Divorced, when?: Divorced in 2010 - Did not get along then.  Now they do for their child. What types of issues is patient dealing with in the relationship?: Has a fiance for 2 yrs. - good relationship Does patient have children?: Yes How many children?: 1  How is patient's relationship with their children?: 3 yrs. old son - loves him, he is ver;y hyper  Childhood History:  By whom was/is the patient raised?: Both parents Description of patient's relationship with caregiver when they were a child: Good childhood Patient's description of current relationship with people who raised him/her: Great relationship - living  with them now while she is in school Does patient have siblings?: Yes Number of Siblings: 1  Description of patient's current relationship with siblings: Very good relationship Did patient suffer any verbal/emotional/physical/sexual abuse as a child?: No Did patient suffer from severe childhood neglect?: No Has patient ever been sexually abused/assaulted/raped as an adolescent or adult?: No Was the patient ever a victim of a crime or a disaster?: No Witnessed domestic violence?: No Has patient been effected by domestic violence as an adult?: No  Education:  Highest grade of school patient has completed: H.S.  - is in [place] Currently a student?: Yes If yes, how has current illness impacted academic performance: no Name of school: [employer] How long has the patient attended?: Currently enrolled since January. Learning disability?: Yes What learning problems does patient have?: ADD  Employment/Work Situation:   Employment situation: Consulting civil engineer Patient's job has been impacted by current illness: No What is the longest time patient has a held a job?: currently in school. Where was the patient employed at that time?: N/A Has patient ever been in the [employer]?: No Has patient ever served in combat?: No  Financial Resources:   Surveyor, quantity resources: Support from parents / caregiver Does patient have a Lawyer or guardian?: No  Alcohol/Substance Abuse:   What has been your use of drugs/alcohol within the last 12 months?: No alcohol, Diazepam, Klonopin, Oxycondone, Hydrocodone - 3 yrs. daily all prescribed - Has taken more than prescribed If attempted suicide, did drugs/alcohol play a role in this?: No Alcohol/Substance Abuse Treatment Hx: Past Tx, Outpatient If yes, describe treatment: Has seen psychologist  at [facility] and  Family - they are aware of what is going on - Needs to lean - primary care, nurses, practioners, urologist, and OBGYN  are  all prescribing.   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, has a good personality, caring person, movitivated to get better In what areas does patient struggle / problems for patient: People not understanding her diseases and the effects on her daily  Discharge Plan:   Does patient have access to transportation?: Yes Will patient be returning to same living situation after discharge?: Yes Currently receiving community mental health services: No ([facility])  Summary/Recommendations:   Summary and Recommendations (to be completed by the evaluator): Pt is a 25 yr.old d/w/f admitted for help with depression and dependence on Opiates and Benzos.  Pt has  Chronic Migraines, Bladder and UIT infections - severe - for which she has been prescribed Opiates and Benzos.  She admits taking more than prescribed.  She admits SI but no intent.  She just wants the pain to go away.  Her main complaint is that others don not understand her diseases  and how  much pain they cause.  Pt lives with her parents and her 7 yr. old son.  She  helps care for her brothers 7 children  who come there during the week.  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  Pt has a lot of family and church support.  She has a Radiographer, therapeutic at [facility] and Family Counseling services.   She is being treated by a Insurance underwriter and an OBGYN.  Pt is motivated for tx and is willing to attend IOP.  She does not want to be away from her 42 yr. old  son. RECOMMEND: Inpatient tx including:  Crisis stabilization, psychiatric eval, medication mgt., therapy groups, psychoedu groups to taeah coping skills, and case mgt.  Recommend  IOP and followup with current psychiatrist and therapist.   Christen Butter 03/11/2012

## 2012-03-11 NOTE — Progress Notes (Signed)
Patient denied SI and HI.   Denied A/V hallucinations.  Denied pain.  Stated she has felt anxious today.

## 2012-03-11 NOTE — Progress Notes (Signed)
Slept fair last nite, appetite is poor, energy level is low and ability to pay attention is improving, depressed 7/10 and hopelessness 9/10, denies Si or Hi, has complained of stomach cramps and diarrhea today, attending group and interacting with peers q25min safety checks continue and support offered Safety maintained

## 2012-03-11 NOTE — Progress Notes (Signed)
[facility] Adult Inpatient Family/Significant Other Suicide Prevention Education  Suicide Prevention Education:  Education Completed; Penny Braun  [name]- has been identified by the patient as the family member/significant other with whom the patient will be residing, and identified as the person(s) who will aid the patient in the event of a mental health crisis (suicidal ideations/suicide attempt).  With written consent from the patient, the family member/significant other has been provided the following suicide prevention education, prior to the and/or following the discharge of the patient.  The suicide prevention education provided includes the following:  Suicide risk factors  Suicide prevention and interventions  National Suicide Hotline telephone number  [facility] assessment telephone number  [facility] Emergency Assistance 911  [facility] and/or Residential Mobile Crisis Unit telephone number  Request made of family/significant other to:  Remove weapons (e.g., guns, rifles, knives), all items previously/currently identified as safety concern.  Pt.'s mother states that there are guns in the home but that they are secure the patient  Remove drugs/medications (over-the-counter, prescriptions, illicit drugs), all items previously/currently identified as a safety concern. Pt.'s mother states she will dispose of the medication that pt. was taking and lock up any medications and help the pt. monitor her medications with a pill organizer. Pt.'s mother states the family did not find out about the pt.'s SI thoughts prior to the night the pt. came in but that the pt. Had told her later that sh had been having SI for about two weeks. Pt.'s mother was given crisis numbers and SI information and agreed to use them if needed.  The family member/significant other verbalizes understanding of the suicide prevention education information provided.  The family  member/significant other agrees to remove the items of safety concern listed above.  Penny [name] [place] 03/11/2012, 5:09 PM

## 2012-03-11 NOTE — Progress Notes (Signed)
[facility] Group Notes:  (Counselor/Nursing/MHT/Case Management/Adjunct)  03/11/2012 11:26 AM  Type of Therapy:  Discharge Planning / Suicide Prevention  Participation Level:  Active  Participation Quality:  Attentive and Drowsy  Affect:  Depressed  Cognitive:  Alert and Oriented   Insight:  Limited  Engagement in Group:  Good  Engagement in Therapy:  Limited  Modes of Intervention:  Clarification, Education, Orientation, Problem-solving and Support  Summary of Progress/Problems:  Pt. attended and participated in aftercare planning group. Pt. accepted information on suicide prevention, warning signs to look for with suicide and crisis line numbers to use. The pt. agreed to call crisis line numbers if having warning signs or having thoughts of suicide. Pt disclosed Post DC plans, which included follow up with current Psychiatrist and Therapist.  Pt stated she was willing to go to IOP but was very reluctant to go to inpatient tx due to wanting to be with her 66 yr old son.  Pt stated she has a lot of support.  Intervention Effective.  Marni Griffon C 03/11/2012, 11:26 AM

## 2012-03-11 NOTE — Progress Notes (Signed)
[facility] MD Progress Note  03/11/2012 1:17 PM  Diagnosis:  Opiate withdrawal, Benzodiazepine withdrawal  ADL's:  Intact  Sleep: Good  Appetite:  Fair  Suicidal Ideation:  denies Homicidal Ideation:  denies  AEB (as evidenced by):  Mental Status Examination/Evaluation: Objective:  Appearance: Casual  Eye Contact::  Fair  Speech:  Clear and Coherent  Volume:  Normal  Mood:  Depressed   P  Affect:  Appropriate  Thought Process:  Goal Directed  Orientation:  Full  Thought Content:  WDL  Suicidal Thoughts:  No  Homicidal Thoughts:  No  Memory:  Immediate;   Fair  Judgement:  Fair  Insight:  Lacking  Psychomotor Activity:  Normal  Concentration:  Fair  Recall:  Fair  Akathisia:  No  Handed:  Right  AIMS (if indicated):     Assets:  Communication Skills Desire for Improvement Physical Health Social Support  Sleep:  Number of Hours: 6.5    Vital Signs:Blood pressure 104/66, pulse 101, temperature 97.8 F (36.6 C), temperature source Oral, resp. rate 16, height 5\' 5"  (1.651 m), weight 91.627 kg (202 lb), last menstrual period 03/06/2012. Current Medications: Current Facility-Administered Medications  Medication Dose Route Frequency Provider Last Rate Last Dose  . acetaminophen (TYLENOL) tablet 650 mg  650 mg Oral Q6H PRN Viviann Spare, NP   650 mg at 03/11/12 1252  . alum & mag hydroxide-simeth (MAALOX/MYLANTA) 200-200-20 MG/5ML suspension 30 mL  30 mL Oral Q4H PRN Viviann Spare, NP      . chlordiazePOXIDE (LIBRIUM) capsule 25 mg  25 mg Oral TID Verne Spurr, PA-C   25 mg at 03/10/12 1737   Followed by  . chlordiazePOXIDE (LIBRIUM) capsule 25 mg  25 mg Oral TID Verne Spurr, PA-C   25 mg at 03/11/12 0850  . cloNIDine (CATAPRES) tablet 0.1 mg  0.1 mg Oral QID Verne Spurr, PA-C   0.1 mg at 03/11/12 1252   Followed by  . cloNIDine (CATAPRES) tablet 0.1 mg  0.1 mg Oral BH-qamhs Verne Spurr, PA-C   0.1 mg at 03/10/12 0932   Followed by  . cloNIDine (CATAPRES) tablet  0.1 mg  0.1 mg Oral QAC breakfast Verne Spurr, PA-C      . dicyclomine (BENTYL) capsule 10 mg  10 mg Oral TID AC & HS Viviann Spare, NP   10 mg at 03/11/12 1252  . dicyclomine (BENTYL) tablet 20 mg  20 mg Oral Q4H PRN Verne Spurr, PA-C   20 mg at 03/11/12 1054  . DULoxetine (CYMBALTA) DR capsule 60 mg  60 mg Oral Daily Viviann Spare, NP   60 mg at 03/11/12 0850  . eletriptan (RELPAX) tablet 40 mg  40 mg Oral PRN Viviann Spare, NP   40 mg at 03/10/12 1610  . flavoxATE (URISPAS) tablet 100 mg  100 mg Oral QID Verne Spurr, PA-C   100 mg at 03/11/12 1252  . hydrOXYzine (ATARAX/VISTARIL) tablet 25 mg  25 mg Oral QHS Viviann Spare, NP   25 mg at 03/10/12 2232  . hydrOXYzine (ATARAX/VISTARIL) tablet 25 mg  25 mg Oral Q6H PRN Verne Spurr, PA-C      . loperamide (IMODIUM) capsule 2-4 mg  2-4 mg Oral PRN Verne Spurr, PA-C   2 mg at 03/11/12 1054  . loratadine (CLARITIN) tablet 10 mg  10 mg Oral Daily Mickeal Skinner, MD   10 mg at 03/11/12 0846  . magnesium hydroxide (MILK OF MAGNESIA) suspension 30 mL  30 mL Oral  Daily PRN Viviann Spare, NP      . methocarbamol (ROBAXIN) tablet 500 mg  500 mg Oral Q8H PRN Verne Spurr, PA-C      . mulitivitamin with minerals tablet 3 tablet  3 tablet Oral Daily Verne Spurr, PA-C   3 tablet at 03/11/12 0846  . naproxen (NAPROSYN) tablet 500 mg  500 mg Oral BID PRN Verne Spurr, PA-C      . ondansetron (ZOFRAN-ODT) disintegrating tablet 4 mg  4 mg Oral Q6H PRN Verne Spurr, PA-C   4 mg at 03/10/12 1317  . pantoprazole (PROTONIX) EC tablet 40 mg  40 mg Oral Q1200 Viviann Spare, NP   40 mg at 03/11/12 1252  . QUEtiapine (SEROQUEL) tablet 200 mg  200 mg Oral QHS Viviann Spare, NP   200 mg at 03/10/12 2231  . QUEtiapine (SEROQUEL) tablet 50 mg  50 mg Oral TID PRN Viviann Spare, NP      . thiamine (VITAMIN B-1) tablet 100 mg  100 mg Oral Daily Verne Spurr, PA-C   100 mg at 03/11/12 9604    Lab Results:  Results for orders placed during  the hospital encounter of 03/09/12 (from the past 48 hour(s))  VITAMIN B12     Status: Normal   Collection Time   03/10/12  6:42 AM      Component Value Range Comment   Vitamin B-12 486  211 - 911 (pg/mL)   LIPID PANEL     Status: Abnormal   Collection Time   03/10/12  6:42 AM      Component Value Range Comment   Cholesterol 95  0 - 200 (mg/dL)    Triglycerides 50  <540 (mg/dL)    HDL 38 (*) >98 (mg/dL)    Total CHOL/HDL Ratio 2.5      VLDL 10  0 - 40 (mg/dL)    LDL Cholesterol 47  0 - 99 (mg/dL)   TSH     Status: Normal   Collection Time   03/10/12  6:42 AM      Component Value Range Comment   TSH 1.082  0.350 - 4.500 (uIU/mL)   T4, FREE     Status: Normal   Collection Time   03/10/12  6:42 AM      Component Value Range Comment   Free T4 0.89  0.80 - 1.80 (ng/dL)   T3, FREE     Status: Normal   Collection Time   03/10/12  6:42 AM      Component Value Range Comment   T3, Free 2.7  2.3 - 4.2 (pg/mL)     Physical Findings: AIMS CIWA:  CIWA-Ar Total: 4  COWS:     Treatment Plan Summary: Daily contact with patient to assess and evaluate symptoms and progress in treatment Medication management  Plan: Continue current plan of care.  Penny [name] 03/11/2012, 1:17 PM

## 2012-03-12 LAB — URINALYSIS, MICROSCOPIC ONLY
Glucose, UA: NEGATIVE mg/dL
Hgb urine dipstick: NEGATIVE
Leukocytes, UA: NEGATIVE
Protein, ur: NEGATIVE mg/dL
Specific Gravity, Urine: 1.024 (ref 1.005–1.030)
pH: 6 (ref 5.0–8.0)

## 2012-03-12 MED ORDER — IBUPROFEN 100 MG/5ML PO SUSP
600.0000 mg | Freq: Four times a day (QID) | ORAL | Status: DC | PRN
Start: 2012-03-12 — End: 2012-03-13
  Administered 2012-03-12: 600 mg via ORAL
  Filled 2012-03-12 (×2): qty 30

## 2012-03-12 MED ORDER — IBUPROFEN 200 MG PO TABS: 400.0000 mg | ORAL_TABLET | Freq: Four times a day (QID) | ORAL | Status: DC | PRN

## 2012-03-12 MED ORDER — NITROFURANTOIN MONOHYD MACRO 100 MG PO CAPS
100.0000 mg | ORAL_CAPSULE | Freq: Two times a day (BID) | ORAL | Status: DC
  Administered 2012-03-12 – 2012-03-13 (×2): 100 mg via ORAL
  Filled 2012-03-12 (×7): qty 1

## 2012-03-12 MED ORDER — HYDROXYZINE HCL 50 MG PO TABS
50.0000 mg | ORAL_TABLET | Freq: Three times a day (TID) | ORAL | Status: DC
  Administered 2012-03-12 – 2012-03-13 (×3): 50 mg via ORAL
  Filled 2012-03-12 (×9): qty 1

## 2012-03-12 MED ORDER — ZINC OXIDE 40 % EX OINT
TOPICAL_OINTMENT | Freq: Three times a day (TID) | CUTANEOUS | Status: DC | PRN
  Filled 2012-03-12: qty 114

## 2012-03-12 NOTE — Progress Notes (Signed)
Pt pleasant on approach but not feeling well.  Pt states she is on her monthly cycle and cramping as well as still experiencing some withdrawal symptoms.  Pt did attend night time wrap up group.  Pt given prn medications to alleviate symptoms.  Denies SI/HI/hallucinations, speaks frequently about her three year old son.  Support and encouragement offered.  Will continue to monitor.

## 2012-03-12 NOTE — Progress Notes (Signed)
Patient ID: Penny [name], female   DOB: March 02, 1986, 26 y.o.   MRN: [id number]  [facility] Group Notes:  (Counselor/Nursing/MHT/Case Management/Adjunct)  03/12/2012 1:15 PM  Type of Therapy:  Group Therapy, Dance/Movement Therapy   Participation Level:  Did Not Attend   Rhunette Croft

## 2012-03-12 NOTE — Progress Notes (Signed)
[facility] MD Progress Note  03/12/2012 2:02 PM  Diagnosis:  Axis I: Substance Abuse and Substance Induced Mood Disorder  ADL's:  Intact  Sleep: Fair  Appetite:  Fair  Suicidal Ideation:  denies Homicidal Ideation:  denies Subjective: Pt. Complains of abdominal pain and bladder discomfort with dysuria. No fever, +little nausea, no vomiting.  +decreased urine output. Mental Status Examination/Evaluation: Objective:  Appearance: Disheveled  Eye Contact::  Good  Speech:  Normal Rate  Volume:  Normal  Mood:  Depressed  Affect:  Congruent  Thought Process:  Circumstantial and Linear  Orientation:  Full  Thought Content:  WDL  Suicidal Thoughts:  No  Homicidal Thoughts:  No  Memory:  Immediate;   Good  Judgement:  Fair  Insight:  Fair  Psychomotor Activity:  Normal  Concentration:  Fair  Recall:  Fair  Akathisia:  No  Handed:  Right  AIMS (if indicated):     Assets:  Communication Skills Desire for Improvement Physical Health Talents/Skills  Sleep:  Number of Hours: 6.75    Vital Signs:Blood pressure 86/59, pulse 68, temperature 97 F (36.1 C), temperature source Oral, resp. rate 16, height 5\' 5"  (1.651 m), weight 91.627 kg (202 lb), last menstrual period 03/06/2012. Current Medications: Current Facility-Administered Medications  Medication Dose Route Frequency Provider Last Rate Last Dose  . acetaminophen (TYLENOL) tablet 650 mg  650 mg Oral Q6H PRN Viviann Spare, NP   650 mg at 03/11/12 2201  . alum & mag hydroxide-simeth (MAALOX/MYLANTA) 200-200-20 MG/5ML suspension 30 mL  30 mL Oral Q4H PRN Viviann Spare, NP      . cloNIDine (CATAPRES) tablet 0.1 mg  0.1 mg Oral QID Verne Spurr, PA-C   0.1 mg at 03/11/12 2158   Followed by  . cloNIDine (CATAPRES) tablet 0.1 mg  0.1 mg Oral BH-qamhs Verne Spurr, PA-C   0.1 mg at 03/10/12 0932   Followed by  . cloNIDine (CATAPRES) tablet 0.1 mg  0.1 mg Oral QAC breakfast Verne Spurr, PA-C      . dicyclomine (BENTYL) capsule 10  mg  10 mg Oral TID AC & HS Viviann Spare, NP   10 mg at 03/12/12 1211  . dicyclomine (BENTYL) tablet 20 mg  20 mg Oral Q4H PRN Verne Spurr, PA-C   20 mg at 03/11/12 1509  . DULoxetine (CYMBALTA) DR capsule 60 mg  60 mg Oral Daily Viviann Spare, NP   60 mg at 03/12/12 0837  . eletriptan (RELPAX) tablet 40 mg  40 mg Oral PRN Viviann Spare, NP   40 mg at 03/10/12 1610  . flavoxATE (URISPAS) tablet 100 mg  100 mg Oral QID Verne Spurr, PA-C   100 mg at 03/12/12 1211  . hydrOXYzine (ATARAX/VISTARIL) tablet 25 mg  25 mg Oral QHS Viviann Spare, NP   25 mg at 03/10/12 2232  . hydrOXYzine (ATARAX/VISTARIL) tablet 25 mg  25 mg Oral Q6H PRN Verne Spurr, PA-C   25 mg at 03/12/12 0606  . loperamide (IMODIUM) capsule 2-4 mg  2-4 mg Oral PRN Verne Spurr, PA-C   2 mg at 03/11/12 1054  . loratadine (CLARITIN) tablet 10 mg  10 mg Oral Daily Mickeal Skinner, MD   10 mg at 03/12/12 0837  . magnesium hydroxide (MILK OF MAGNESIA) suspension 30 mL  30 mL Oral Daily PRN Viviann Spare, NP      . methocarbamol (ROBAXIN) tablet 500 mg  500 mg Oral Q8H PRN Verne Spurr, PA-C   500  mg at 03/12/12 1052  . mulitivitamin with minerals tablet 3 tablet  3 tablet Oral Daily Verne Spurr, PA-C   3 tablet at 03/12/12 8295  . naproxen (NAPROSYN) tablet 500 mg  500 mg Oral BID PRN Verne Spurr, PA-C   500 mg at 03/12/12 0843  . ondansetron (ZOFRAN-ODT) disintegrating tablet 4 mg  4 mg Oral Q6H PRN Verne Spurr, PA-C   4 mg at 03/11/12 2200  . pantoprazole (PROTONIX) EC tablet 40 mg  40 mg Oral Q1200 Viviann Spare, NP   40 mg at 03/12/12 1211  . QUEtiapine (SEROQUEL) tablet 200 mg  200 mg Oral QHS Viviann Spare, NP   200 mg at 03/11/12 2159  . QUEtiapine (SEROQUEL) tablet 50 mg  50 mg Oral TID PRN Viviann Spare, NP   50 mg at 03/12/12 1052  . thiamine (VITAMIN B-1) tablet 100 mg  100 mg Oral Daily Verne Spurr, PA-C   100 mg at 03/12/12 6213    Lab Results: Urine analysis is pending.  Physical  Findings: AIMS: CIWA:  CIWA-Ar Total: 5  COWS:  COWS Total Score: 7   Treatment Plan Summary: Daily contact with patient to assess and evaluate symptoms and progress in treatment Medication management  Plan: 1. Urine analysis ordered stat.          2. Liquid Ibuprofen for abdominal discomfort.          3.  Increased hydroxyzine for interstitial cystitis symptoms.          4. Reviewed labs, meds, ordered labs, ordered medications for symptoms.          5. Continue current plan of care.  D/C upon completion of detox.          6. Will order macrobid for coverage.  Cherylann Hobday 03/12/2012, 2:02 PM

## 2012-03-12 NOTE — Progress Notes (Signed)
Pt has been visible and has attended various milieu activities throughout the day today, pt does endorse feelings of depression and has spoken about having abdominal pain and cramping as well as having urinary burning, pt has spoken about having taken too much of her pain medications but is unsure of the plan going forward on how to manage the pain she experiences, pt has received various medications throughout the day, will continue to monitor

## 2012-03-12 NOTE — Progress Notes (Signed)
Pleasant on approach, complaints of continued cramping from her monthly cycle.  Pt also reports continued anxiety.  Medication given as ordered, denies SI/HI/hallucinations.  Support and encouragement offered, will continue to monitor.

## 2012-03-12 NOTE — Progress Notes (Signed)
Patient ID: Penny [name], female   DOB: [birth year], 26 y.o.   MRN: [id number] Pt. attended and participated in aftercare planning group. Pt. accepted information on suicide prevention, warning signs to look for with suicide and crisis line numbers to use. The pt. agreed to call crisis line numbers if having warning signs or having thoughts of suicide. Pt. listed their current anxiety level as Pt. listed their current anxiety level as a 4 and  depression 1on a scale of 1 to 10 with 10 being the high.  Pt accepted an NA meeting schedule.

## 2012-03-13 MED ORDER — PENTOSAN POLYSULFATE SODIUM 100 MG PO CAPS: 100.0000 mg | ORAL_CAPSULE | Freq: Three times a day (TID) | ORAL | Status: DC

## 2012-03-13 MED ORDER — CLOBETASOL PROPIONATE 0.05 % EX CREA: TOPICAL_CREAM | Freq: Two times a day (BID) | CUTANEOUS | Status: DC

## 2012-03-13 MED ORDER — NICOTINE 21 MG/24HR TD PT24
21.0000 mg | MEDICATED_PATCH | Freq: Every day | TRANSDERMAL | Status: DC
  Administered 2012-03-13: 21 mg via TRANSDERMAL
  Filled 2012-03-13 (×4): qty 1

## 2012-03-13 MED ORDER — DICYCLOMINE HCL 10 MG PO CAPS: 10.0000 mg | ORAL_CAPSULE | Freq: Three times a day (TID) | ORAL | Status: DC

## 2012-03-13 MED ORDER — OMEPRAZOLE 20 MG PO CPDR: 20.0000 mg | DELAYED_RELEASE_CAPSULE | Freq: Two times a day (BID) | ORAL | Status: DC

## 2012-03-13 MED ORDER — NITROFURANTOIN MONOHYD MACRO 100 MG PO CAPS: 100.0000 mg | ORAL_CAPSULE | Freq: Two times a day (BID) | ORAL | Status: AC

## 2012-03-13 MED ORDER — HYDROXYZINE HCL 25 MG PO TABS: 25.0000 mg | ORAL_TABLET | Freq: Every day | ORAL | Status: DC

## 2012-03-13 MED ORDER — ZINC OXIDE 40 % EX OINT
TOPICAL_OINTMENT | Freq: Three times a day (TID) | CUTANEOUS | Status: DC | PRN
  Filled 2012-03-13: qty 114

## 2012-03-13 MED ORDER — DULOXETINE HCL 60 MG PO CPEP: 60.0000 mg | ORAL_CAPSULE | Freq: Every day | ORAL | Status: DC

## 2012-03-13 MED ORDER — QUETIAPINE FUMARATE 200 MG PO TABS: 200.0000 mg | ORAL_TABLET | Freq: Every day | ORAL | Status: DC

## 2012-03-13 MED ORDER — HYDROXYZINE HCL 50 MG PO TABS: 50.0000 mg | ORAL_TABLET | Freq: Three times a day (TID) | ORAL | Status: AC | PRN

## 2012-03-13 MED ORDER — FLAVOXATE HCL 100 MG PO TABS: 100.0000 mg | ORAL_TABLET | Freq: Four times a day (QID) | ORAL | Status: DC | PRN

## 2012-03-13 MED ORDER — LORATADINE 10 MG PO TABS: 10.0000 mg | ORAL_TABLET | Freq: Every day | ORAL | Status: DC

## 2012-03-13 MED ORDER — IBUPROFEN 200 MG PO TABS: 600.0000 mg | ORAL_TABLET | Freq: Four times a day (QID) | ORAL | Status: DC | PRN

## 2012-03-13 NOTE — Treatment Plan (Signed)
Interdisciplinary Treatment Plan Update (Adult)  Date: 03/13/2012  Time Reviewed: 10:32 AM   Progress in Treatment: Attending groups: Yes Participating in groups: Yes Taking medication as prescribed: Yes Tolerating medication: Yes   Family/Significant other contact made:  Yes by counselor Patient understands diagnosis:  Yes  As evidenced by asking for help with anxiety, substance abuse Discussing patient identified problems/goals with staff:  Yes  See below Medical problems stabilized or resolved:  Yes Denies suicidal/homicidal ideation: Yes In tx team Issues/concerns per patient self-inventory:  None noted Other:  New problem(s) identified: N/A  Reason for Continuation of Hospitalization: Other; describe D/C today  Interventions implemented related to continuation of hospitalization:   Additional comments:  Estimated length of stay: D/C today  Discharge Plan: Return home, follow up with outpt providers  New goal(s): N/A  Review of initial/current patient goals per problem list:   1.  Goal(s):Safely detox from opiates  Met:  Yes  Target date:4/08  As evidenced ZO:XWRU score of 0, stable vitals  2.  Goal (s): Identify comprehensive sobriety plan  Met:  Yes  Target date:4/08  As evidenced EA:VWUJW states she will attend IOP groups at the [facility], and follow up with [facility]  3.  Goal(s): Decrease anxiety and depression  Met:  Yes  Target date:4/08  As evidenced by: Self report-[name] states her depression is eliminated and the medications are addressing her anxiety  4.  Goal(s):  Met:  Yes  Target date:  As evidenced by:  Attendees: [name]  03/13/2012 10:32 AM  Family:     Physician:  Lupe Carney 03/13/2012 10:32 AM   Nursing: Roswell Miners   03/13/2012 10:32 AM   Case Manager:  Richelle Ito, LCSW 03/13/2012 10:32 AM   Counselor:  Ronda Fairly, LCSWA 03/13/2012 10:32 AM   Other:     Other:     Other:     Other:        Scribe for Treatment Team:   Ida Rogue, 03/13/2012 10:32 AM

## 2012-03-13 NOTE — Progress Notes (Signed)
[facility] Group Notes:  (Counselor/Nursing/MHT/Case Management/Adjunct)  03/13/2012 3:47 PM  Type of Therapy:  Group Therapy at 11  Participation Level:  Active  Participation Quality:  Appropriate, Attentive and Sharing  Affect:  Appropriate  Cognitive:  Appropriate  Insight:  Limited  Engagement in Group:  Good  Engagement in Therapy:  Good  Modes of Intervention:  Clarification, Problem-solving, Socialization and Support  Summary of Progress/Problems:  Penny [name] shared that she is looking forward to graduating next month from course of studies and exploring opportunities for work although patient's physician has suggested she file for disabilityity. Patient was attentive to discussion on barriers and plans on spending time with her young son upon discharge.  Others in group were supportive yet challenged patient to come up with more self care than spending time with son.    Clide Dales 03/13/2012, 3:47 PM

## 2012-03-13 NOTE — [facility] Suicide Risk Assessment (Signed)
Suicide Risk Assessment  Discharge Assessment      Demographic factors: Caucasian;Adolescent or young adult  Current Mental Status Per Nursing Assessment::   On Admission:   (Denies SI/HI) At Discharge:  Pt denied any SI/HI/thoughts of self harm or acute psychiatric issues in treatment team with clinical, nursing and medical team present.  Current Mental Status Per Physician: Patient seen and evaluated. Chart reviewed. Patient stated that her mood was "good". "not depressed, laughing". Her affect was mood congruent and euthymic. She denied any current thoughts of self injurious behavior, suicidal ideation or homicidal ideation. There were no auditory or visual hallucinations, paranoia, delusional thought processes, or mania noted.  Thought process was linear and goal directed.  No psychomotor agitation or retardation was noted. Speech was normal rate, tone and volume. Eye contact was good. Judgment and insight are fair.  Patient has been up and engaged on the unit.  No acute safety concerns reported from team.  No sig withdrawal s/s noted at this time.  Loss Factors: Medical Hx  Historical Factors: Family history of suicide; Hx postpartum depression; FamHx reported ADHD  Risk Reduction Factors: Positive social support;Living with another person, especially a relative;Employed;Sense of responsibility to family;Responsible for children under 32 years of age; supportive family; son; engaged  Continued Clinical Symptoms:  Severe Anxiety and/or Agitation Previous Psychiatric Diagnoses and Treatments  Discharge Diagnoses:  AXIS I:  Opioid Dependence, W/D resolved AXIS II: Deferred AXIS III:   Past Medical History  Diagnosis Date  . Migraines   . Interstitial cystitis   . IUD FEB 2010  . Gastritis JULY 2011  . Depression   . 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 Fatty liver   . Obesity (BMI 30-39.9) 2011 228 LBS BMI 36.8  . Iron deficiency anemia 07/23/2010  . Irritable bowel syndrome  2012 DIARRHEA    JUN 2012 TTG IgA 14.9  . Elevated liver enzymes JUL 2011ALK PHOS 111-127 AST  143-267 ALT  213-321T BILI 0.6  ALB  3.7-4.06 Jun 2011 ALK PHOS 118 AST 24 ALT 42 T BILI 0.4 ALB 3.9  . Chronic daily headache    AXIS IV:  Moderate AXIS V:  55  Cognitive Features That Contribute To Risk: limited coping skills  Suicide Risk: Pt viewed as a chronic moderate risk of harm to self in light of her past hx and risk factors.  No acute safety concerns since on the unit.  Pt contracting for safety and is stable for discharge on current meds.  Plan Of Care/Follow-up recommendations: Pt seen and evaluated in treatment team. Chart reviewed.  Pt stable for and requesting discharge. Pt contracting for safety and does not currently meet [place] involuntary commitment criteria for continued hospitalization against their will.  Mental health treatment, medication management and continued sobriety will mitigate against the potential increased risk of harm to self and/or others.  Discussed the importance of recovery further with pt, as well as, tools to move forward in a healthy & safe manner.  Pt agreeable with the plan.  Discussed with the team.  Please see orders, follow up appointments per AVS (f/u with her therapist and psychiatric management/IOP at the [facility]) and full discharge summary to be completed by physician extender.  Recommend follow up with NA.  Diet: Regular.  Activity: As tolerated.     Lupe Carney 03/13/2012, 12:14 PM

## 2012-03-13 NOTE — Progress Notes (Signed)
Patient ID: Penny [name], female   DOB: 1986/06/03, 26 y.o.   MRN: [id number] Has been up and to groups interacting with peers and staff. Self inventory: depressed 5, hopeless 2, denies SI thoughts, continues to report chronic back/flank pain .

## 2012-03-13 NOTE — Progress Notes (Signed)
[employer] Case Management Discharge Plan:  Will you be returning to the same living situation after discharge: Yes,  home At discharge, do you have transportation home?:Yes,  mother Do you have the ability to pay for your medications:Yes,  medicaid  Interagency Information:     Release of information consent forms completed and in the chart;  Patient's signature needed at discharge.  Patient to Follow up at:  Follow-up Information    Follow up with [facility]. (Follow up at your next scheduled appointment)    Contact information:   [address] [place] [phone]      Follow up with [facility] on 03/15/2012. (Your assessment with [name] is scheduled for 1PM.  Call  more than 24 hours in advance to reschedule this if it is not convenient for you or you will be billed)    Contact information:   [address]  [place]  [phone]         Patient denies SI/HI:   Yes,  yes    Safety Planning and Suicide Prevention discussed:  Yes,  yes  Barrier to discharge identified:No.  Summary and Recommendations:   Penny [name] 03/13/2012, 12:22 PM

## 2012-03-13 NOTE — Progress Notes (Signed)
Patient ID: Penny [name], female   DOB: 1986/05/29, 26 y.o.   MRN: [id number] Pt. Awake, alert, NAD.  Affect is bright but slightly anxious.  Mood is slightly anxious but excited about discharge.  Discharge information reviewed and discussed.  Follow-up appointments reviewed.  Pt. Given rx's and samples.  Pt. Verbalized understanding. Pt. Discharged home with sister-in-law providing transportation.  Pt. Denies SI/HI/AVH.

## 2012-03-15 NOTE — Progress Notes (Signed)
Patient Discharge Instructions:  Psychiatric Admission Assessment Note Provided,  03/15/2012 After Visit Summary (AVS) Provided,  03/15/2012 Face Sheet Provided, 03/15/2012 Faxed/Sent to the Next Level Care provider:  03/15/2012 Sent Suicide Risk Assessment - Discharge Assessment 03/15/2012  Faxed to [facility] @ [phone]  Wandra Scot, 03/15/2012, 2:52 PM

## 2012-03-20 NOTE — Discharge Summary (Signed)
Physician Discharge Summary Note  Patient:  Penny [name] is an 26 y.o., female MRN:  [id number] DOB:  03-29-1986 Patient phone:  [phone] (home)  Patient address:   [address] 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 Migraines   . Interstitial cystitis   . IUD FEB 2010  . Gastritis JULY 2011  . Depression   . 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 Fatty liver   . Obesity (BMI 30-39.9) 2011 228 LBS BMI 36.8  . Iron deficiency anemia 07/23/2010  . Irritable bowel syndrome 2012 DIARRHEA    JUN 2012 TTG IgA 14.9  . Elevated liver enzymes JUL 2011ALK PHOS 111-127 AST  143-267 ALT  213-321T BILI 0.6  ALB  3.7-4.06 Jun 2011 ALK PHOS 118 AST 24 ALT 42 T BILI 0.4 ALB 3.9  . Chronic daily headache    AXIS IV:  deferred AXIS V:  61-70 mild symptoms  Level of Care:  OP  Hospital Course:  Presented requesting detox from opiates. She got involved in taking opiates for chronic pain from interstitial cystitis, and chronic migraine headaches she previously treated at the [facility]. She had been taking hydrocodone, and oxycodone as well as Klonopin, alprazolam, and other benzodiazepines.  She was admitted for detox unit and gradually assimilated in the milieu. Participation in group therapy and unit activities was satisfactory. She was cooperative and appropriate  with peers and staff. She was detoxed using both the clonidine detox protocol opiates, and a Librium protocol for benzodiazepines. His detox was uneventful. At age she was fully alert, having no suicidal thoughts, and looking forward to outpatient treatment. Her mother is supportive is going to transport her home.  Consults:  None  Discharge Vitals:   Blood pressure 122/78, pulse 66, temperature 96.8 F (36 C), temperature source Oral, resp. rate 20, height 5\' 5"  (1.651 m), weight 91.627 kg (202 lb), last menstrual period 03/06/2012.  Mental Status Exam: See Mental Status Examination and Suicide Risk Assessment completed by Attending Physician prior to discharge.  Discharge destination:  Home  Is patient on multiple antipsychotic therapies at discharge:  No   Has Patient had three or more failed trials of antipsychotic monotherapy by history:  No  Recommended Plan for Multiple Antipsychotic Therapies: N/A   Medication List  As of 03/20/2012 12:01 PM   STOP taking these medications         acetaminophen 500 MG tablet      amphetamine-dextroamphetamine 20 MG 24 hr capsule      cephALEXin 500 MG capsule      clonazePAM 1 MG tablet      diazepam 10 MG tablet      diphenhydrAMINE 25 mg capsule      fluconazole 100 MG tablet      nystatin-triamcinolone cream      oxyCODONE-acetaminophen 5-325 MG per tablet         TAKE  these medications      Indication    BIOTIN PO   Take 1 tablet by mouth daily.       clobetasol cream 0.05 %   Commonly known as: TEMOVATE   Apply topically 2 (two) times daily. For skin disorder.       dicyclomine 10 MG capsule   Commonly known as: BENTYL   Take 1 capsule (10 mg total) by mouth 4 (four) times daily -  before meals and at bedtime. For smooth muscle cramping.       DULoxetine 60 MG capsule   Commonly known as: CYMBALTA   Take 1 capsule (60 mg total) by mouth daily. For depression.       eletriptan 40 MG tablet   Commonly known as:  RELPAX   One tablet by mouth as needed for migraine headache.  If the headache improves and then returns, dose may be repeated after 2 hours have elapsed since first dose (do not exceed 80 mg per day). may repeat in 2 hours if necessary       FERAHEME 510 MG/17ML Soln   Generic drug: ferumoxytol   Inject 510 mg into the vein every 6 (six) months. Iron Levels       flavoxATE 100 MG tablet   Commonly known as: URISPAS   Take 1 tablet (100 mg total) by mouth 4 (four) times daily as needed (for spasms.).       hydrOXYzine 50 MG tablet   Commonly known as: ATARAX/VISTARIL   Take 1 tablet (50 mg total) by mouth every 8 (eight) hours as needed for anxiety (sleep).       ibuprofen 200 MG tablet   Commonly known as: ADVIL,MOTRIN   Take 3 tablets (600 mg total) by mouth every 6 (six) hours as needed for pain.       levonorgestrel 20 MCG/24HR IUD   Commonly known as: MIRENA   1 each by Intrauterine route once.       loratadine 10 MG tablet   Commonly known as: CLARITIN   Take 1 tablet (10 mg total) by mouth at bedtime. For allergies.       nitrofurantoin (macrocrystal-monohydrate) 100 MG capsule   Commonly known as: MACROBID   Take 1 capsule (100 mg total) by mouth every 12 (twelve) hours. For dysuria.       omeprazole 20 MG capsule   Commonly known as: PRILOSEC   Take 1 capsule (20 mg total) by mouth 2 (two) times daily. For acid reflux       pentosan polysulfate 100 MG capsule   Commonly known as: ELMIRON   Take 1 capsule (100 mg total) by mouth 3 (three) times daily before meals. Take per urologist directions, for interstitial cystitis.       QUEtiapine 200 MG tablet   Commonly known as: SEROQUEL   Take 1 tablet (200 mg total) by mouth at bedtime. For sleep and calm thoughts.            Follow-up Information    Follow up with [facility]. (Follow up at your next scheduled appointment)    Contact information:   [address] [place] [phone]      Follow up  with [facility] on 03/15/2012. (Your assessment with [name] is scheduled for 1PM.  Call  more than 24 hours in advance to reschedule this if it is not convenient for you or you will be billed)    Contact information:  [address]  [place]  [phone]         Follow-up recommendations:  Activity:  unrestricted Diet:  regular  Signed: Assad Harbeson A 03/20/2012, 12:01 PM

## 2012-04-05 ENCOUNTER — Telehealth: Payer: Self-pay

## 2012-04-05 NOTE — Telephone Encounter (Addendum)
T/C from pt. Said she is having severe abdominal pain today. She rates the pain a 9 at this time today, although she has just eaten a grilled chicken sandwich and had a little of diet coke. That is all that she has had today. She said her abdominal pain is more in the center and between her breasts and umbilical. Sometimes the pain will radiate to her right side, but it has not today thus far. I told her that I will call her back when Dr. Darrick Penna responds the the note. However, if the pain is that bad and worsens, she should go to the ED.

## 2012-04-05 NOTE — Telephone Encounter (Signed)
LMOM to call.

## 2012-04-05 NOTE — Telephone Encounter (Signed)
Please call pt. She sounds like she's having a flare of IBS. She needs to follow a full liquid diet for 3 days. She should use Phenergan as needed for nausea or vomiting. Use Tylenol as needed for pain. USE BENTYL AS NEEDED FOR DIARRHEA AND PAIN. CALL MON IF Sx NOT IMPROVED.

## 2012-04-05 NOTE — Telephone Encounter (Signed)
Pt returned call and was informed.  

## 2012-05-03 ENCOUNTER — Emergency Department (HOSPITAL_COMMUNITY)
Admission: EM | Admit: 2012-05-03 | Discharge: 2012-05-04 | Disposition: A | Discharge status: Home / Self Care | Payer: Medicaid Other | Attending: Emergency Medicine | Admitting: Emergency Medicine

## 2012-05-03 ENCOUNTER — Encounter (HOSPITAL_COMMUNITY): Payer: Self-pay | Admitting: Emergency Medicine

## 2012-05-03 DIAGNOSIS — K589 Irritable bowel syndrome without diarrhea: Secondary | ICD-10-CM | POA: Insufficient documentation

## 2012-05-03 DIAGNOSIS — F17200 Nicotine dependence, unspecified, uncomplicated: Secondary | ICD-10-CM | POA: Insufficient documentation

## 2012-05-03 DIAGNOSIS — Z975 Presence of (intrauterine) contraceptive device: Secondary | ICD-10-CM | POA: Insufficient documentation

## 2012-05-03 DIAGNOSIS — R112 Nausea with vomiting, unspecified: Secondary | ICD-10-CM | POA: Insufficient documentation

## 2012-05-03 DIAGNOSIS — R109 Unspecified abdominal pain: Secondary | ICD-10-CM | POA: Insufficient documentation

## 2012-05-03 DIAGNOSIS — N3010 Interstitial cystitis (chronic) without hematuria: Secondary | ICD-10-CM | POA: Insufficient documentation

## 2012-05-03 NOTE — ED Notes (Signed)
Patient c/o RLQ pain; states has been having difficulty with this for the past few months, but has recently developed diarrhea with nausea and vomiting.

## 2012-05-04 ENCOUNTER — Other Ambulatory Visit (HOSPITAL_COMMUNITY): Payer: Self-pay

## 2012-05-04 LAB — URINALYSIS, ROUTINE W REFLEX MICROSCOPIC
Bilirubin Urine: NEGATIVE
Ketones, ur: NEGATIVE mg/dL
Leukocytes, UA: NEGATIVE
Nitrite: NEGATIVE
Protein, ur: NEGATIVE mg/dL
pH: 6 (ref 5.0–8.0)

## 2012-05-04 LAB — WET PREP, GENITAL
Trich, Wet Prep: NONE SEEN
Yeast Wet Prep HPF POC: NONE SEEN

## 2012-05-04 MED ORDER — ONDANSETRON HCL 4 MG/2ML IJ SOLN
4.0000 mg | Freq: Once | INTRAMUSCULAR | Status: AC
  Administered 2012-05-04: 4 mg via INTRAVENOUS
  Filled 2012-05-04: qty 2

## 2012-05-04 MED ORDER — KETOROLAC TROMETHAMINE 30 MG/ML IJ SOLN
30.0000 mg | Freq: Once | INTRAMUSCULAR | Status: AC
  Administered 2012-05-04: 30 mg via INTRAVENOUS
  Filled 2012-05-04: qty 1

## 2012-05-04 NOTE — ED Notes (Signed)
Discharge instructions reviewed with pt, questions answered. Pt verbalized understanding.  

## 2012-05-04 NOTE — ED Provider Notes (Signed)
History     CSN: [id number]  Arrival date & time 05/03/12  2308   First MD Initiated Contact with Patient 05/04/12 0004      Chief Complaint  Patient presents with  . Abdominal Pain     Patient is a 26 y.o. female presenting with abdominal pain. The history is provided by the patient and a significant other.  Abdominal Pain The primary symptoms of the illness include abdominal pain, nausea and vomiting. The primary symptoms of the illness do not include fever, diarrhea, dysuria or vaginal bleeding. The current episode started 6 to 12 hours ago. The onset of the illness was gradual. The problem has been gradually worsening.  The patient states that she believes she is currently not pregnant. Symptoms associated with the illness do not include urgency or back pain.  Pt reports that earlier today she noticed lower right abdominal pain that improved Hours later after dinner she had the return of the pain and it is persistent She reports one episode of vomiting She reports h/o similar pain in pain in past, usually on monthly basis  Past Medical History  Diagnosis Date  . Migraines   . Interstitial cystitis   . IUD FEB 2010  . Gastritis JULY 2011  . Depression   . 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 Fatty liver   . Obesity (BMI 30-39.9) 2011 228 LBS BMI 36.8  . Iron deficiency anemia 07/23/2010  . Irritable bowel syndrome 2012 DIARRHEA    JUN 2012 TTG IgA 14.9  . Elevated liver enzymes JUL 2011ALK PHOS 111-127 AST  143-267 ALT  213-321T BILI 0.6  ALB  3.7-4.06 Jun 2011 ALK PHOS 118 AST 24 ALT 42 T BILI 0.4 ALB 3.9  . Chronic daily headache     Past Surgical History  Procedure Date  . Gab 2007    in [place]-POUCH 5 CM  . Cholecystectomy 2005    biliary dyskinesia  . Cath self every nite     for sodium bicarb injection  . Colonoscopy JUN 2012 ABD PN/DIARRHEA WITH PROPOFOL    NL COLON  . Upper gastrointestinal endoscopy JULY 2011 NAUSEA-D125,V6, PH 25    Bx;  GASTRITIS, POUCH-5 CM LONG  . Dilation and curettage of uterus     Family History  Problem Relation Age of Onset  . Hemochromatosis Maternal Grandmother   . Migraines Maternal Grandmother   . Cancer Maternal Grandmother   . Hypertension Father   . Diabetes Father   . Coronary artery disease Father   . Migraines Paternal Grandmother   . Cancer Mother   . Coronary artery disease Paternal Grandfather     History  Substance Use Topics  . Smoking status: Current Everyday Smoker -- 0.5 packs/day    Types: Cigarettes  . Smokeless tobacco: Not on file  . Alcohol Use: No    OB History    Grav Para Term Preterm Abortions TAB SAB Ect Mult Living   1 1 1       1       Review of Systems  Constitutional: Negative for fever.  Gastrointestinal: Positive for nausea, vomiting and abdominal pain. Negative for diarrhea.  Genitourinary: Negative for dysuria, urgency and vaginal bleeding.  Musculoskeletal: Negative for back pain.  Neurological: Negative for weakness.  All other systems reviewed and are negative.    Allergies  Adhesive; Codeine; Gabapentin; Latex; and Metoclopramide hcl  Home Medications   Current Outpatient Rx  Name Route Sig Dispense Refill  . DULOXETINE HCL 60 MG PO CPEP Oral Take 1 capsule (60 mg total) by mouth daily. For depression. 30 capsule 0  . ELETRIPTAN HYDROBROMIDE 40 MG PO TABS Oral One tablet by mouth as needed for migraine headache.  If the headache improves and then returns, dose may be repeated after 2 hours have elapsed since first dose (do not exceed 80 mg per day). may repeat in 2 hours if necessary     . FERUMOXYTOL INJECTION 510 MG/17 ML Intravenous Inject 510 mg into the vein every 6 (six) months. Iron Levels    . IBUPROFEN 200 MG PO TABS Oral Take 3 tablets (600 mg total) by mouth every 6 (six) hours as needed for pain.    Marland Kitchen LEVONORGESTREL 20 MCG/24HR IU IUD Intrauterine 1 each by Intrauterine route once.      Marland Kitchen LORATADINE 10 MG PO TABS Oral Take  1 tablet (10 mg total) by mouth at bedtime. For allergies.    . OMEPRAZOLE 20 MG PO CPDR Oral Take 1 capsule (20 mg total) by mouth 2 (two) times daily. For acid reflux    . PENTOSAN POLYSULFATE SODIUM 100 MG PO CAPS Oral Take 1 capsule (100 mg total) by mouth 3 (three) times daily before meals. Take per urologist directions, for interstitial cystitis.    Marland Kitchen BIOTIN PO Oral Take 1 tablet by mouth daily.    Marland Kitchen CLOBETASOL PROPIONATE 0.05 % EX CREA Topical Apply topically 2 (two) times daily. For skin disorder.    Marland Kitchen DICYCLOMINE HCL 10 MG PO CAPS Oral Take 1 capsule (10 mg total) by mouth 4 (four) times daily -  before meals and at bedtime. For smooth muscle cramping.    Marland Kitchen FLAVOXATE HCL 100 MG PO TABS Oral Take 1 tablet (100 mg total) by mouth 4 (four) times daily as needed (for spasms.). 30 tablet   . QUETIAPINE FUMARATE 200 MG PO TABS Oral Take 1 tablet (200 mg total) by mouth at bedtime. For sleep and calm thoughts. 30 tablet 0    BP 130/80  Pulse 70  Temp(Src) 98 F (36.7 C) (Oral)  Resp 20  Ht 5\' 11"  (1.803 m)  Wt 220 lb (99.791 kg)  BMI 30.68 kg/m2  SpO2 96%  Physical Exam CONSTITUTIONAL: Well developed/well nourished HEAD AND FACE: Normocephalic/atraumatic EYES: EOMI/PERRL ENMT: Mucous membranes moist NECK: supple no meningeal signs SPINE:entire spine nontender CV: S1/S2 noted, no murmurs/rubs/gallops noted LUNGS: Lungs are clear to auscultation bilaterally, no apparent distress ABDOMEN: soft, nontender, no rebound or guarding GU:no cva tenderness No vaginal bleeding.  Minimal amount of whitish discharge.  No adnexal tenderness or obvious mass NEURO: Pt is awake/alert, moves all extremitiesx4 EXTREMITIES: pulses normal, full ROM SKIN: warm, color normal PSYCH: no abnormalities of mood noted  ED Course  Procedures   Labs Reviewed  URINALYSIS, ROUTINE W REFLEX MICROSCOPIC - Abnormal; Notable for the following:    Specific Gravity, Urine <1.005 (*)    All other components  within normal limits  POCT PREGNANCY, URINE  GC/CHLAMYDIA PROBE AMP, GENITAL  WET PREP, GENITAL  1:24 AM Pt/family only requesting non-narcotic meds  Pt well appearing, nontoxic She reports she gets this pain frequently and has never had pelvic US This could be done as outpatient as pt is nontoxic and not showing signs of TOA or ovarian torsion I doubt appendicitis or other acute abd process  The patient appears reasonably screened and/or stabilized for discharge and I doubt any other medical condition  or other [facility] requiring further screening, evaluation, or treatment in the ED at this time prior to discharge.       MDM  Nursing notes reviewed and considered in documentation All labs/vitals reviewed and considered Previous records reviewed and considered         Joya Gaskins, MD 05/04/12 [phone]

## 2012-05-04 NOTE — Discharge Instructions (Signed)
Abdominal (belly) pain can be caused by many things. any cases can be observed and treated at home after initial evaluation in the emergency department. Even though you are being discharged home, abdominal pain can be unpredictable. Therefore, you need a repeated exam if your pain does not resolve, returns, or worsens. Most patients with abdominal pain don't have to be admitted to the hospital or have surgery, but serious problems like appendicitis and gallbladder attacks can start out as nonspecific pain. Many abdominal conditions cannot be diagnosed in one visit, so follow-up evaluations are very important. °SEEK IMMEDIATE MEDICAL ATTENTION IF: °The pain does not go away or becomes severe, particularly over the next 8-12 hours.  °A temperature above 100.4F develops.  °Repeated vomiting occurs (multiple episodes).  °The pain becomes localized to portions of the abdomen. The right side could possibly be appendicitis. In an adult, the left lower portion of the abdomen could be colitis or diverticulitis.  °Blood is being passed in stools or vomit (bright red or black tarry stools).  °Return also if you develop chest pain, difficulty breathing, dizziness or fainting, or become confused, poorly responsive ° °

## 2012-05-05 LAB — GC/CHLAMYDIA PROBE AMP, GENITAL: GC Probe Amp, Genital: NEGATIVE

## 2012-05-15 ENCOUNTER — Other Ambulatory Visit: Payer: Self-pay | Admitting: Gastroenterology

## 2012-05-16 ENCOUNTER — Telehealth: Payer: Self-pay | Admitting: Gastroenterology

## 2012-05-16 NOTE — Telephone Encounter (Signed)
PT SHOULD BE SCHEDULED IN AN URGENT SPOT FOR AN EXTENDER AS SOON AS POSSIBLE. OTHERWISE IF SHE CAN'T WAIT  SHE CAN GO TO ED.

## 2012-05-16 NOTE — Telephone Encounter (Signed)
Called pt. Husband, Elige Radon, said she was unavailable. ( She had told me that she had an eye appt and it would be OK to leave a message with him. I informed him our next available was not until 06/05/2012 , but Dr. Darrick Penna said she should go to ED if she needs to and can't wait for urgent. ( I told him I would check schedule tomorrow morning for cancellations, no urgents available tomorrow. I will call back if we have something, but she should go to ED if her abdominal pain is severe or she feels she is dehydrating.

## 2012-05-16 NOTE — Telephone Encounter (Signed)
Called pt. She said she continues to have the abdominal pain and n/v if she eats anything. It is better when she goes on clear liquids but she still has a problem. I could not find an appt for right away, and she has not been seen since 12/2011. Please advise!

## 2012-05-16 NOTE — Telephone Encounter (Signed)
Pt called and asked to speak with SF's nurse. I told her I would need to take a message and have her call patient back. [phone]

## 2012-05-17 NOTE — Telephone Encounter (Signed)
Called and informed Penny [name] that pt can be seen on 05/18/2012 at 11:30 AM with Tana Coast, PA. He is aware she should be here by 11:15 AM. The appt was cancelled for 06/05/2012.

## 2012-05-18 ENCOUNTER — Ambulatory Visit: Payer: Self-pay | Admitting: Gastroenterology

## 2012-06-02 ENCOUNTER — Other Ambulatory Visit (HOSPITAL_COMMUNITY): Payer: Self-pay | Admitting: Emergency Medicine

## 2012-06-02 ENCOUNTER — Encounter (HOSPITAL_COMMUNITY): Payer: Self-pay | Admitting: *Deleted

## 2012-06-02 ENCOUNTER — Emergency Department (HOSPITAL_COMMUNITY)
Admission: EM | Admit: 2012-06-02 | Discharge: 2012-06-02 | Disposition: A | Discharge status: Home / Self Care | Payer: Medicaid Other | Attending: Emergency Medicine | Admitting: Emergency Medicine

## 2012-06-02 ENCOUNTER — Inpatient Hospital Stay (HOSPITAL_COMMUNITY): Admit: 2012-06-02 | Payer: Self-pay

## 2012-06-02 ENCOUNTER — Ambulatory Visit (HOSPITAL_COMMUNITY): Admission: RE | Admit: 2012-06-02 | Payer: Medicaid Other | Source: Ambulatory Visit

## 2012-06-02 ENCOUNTER — Ambulatory Visit (HOSPITAL_COMMUNITY)
Admission: RE | Admit: 2012-06-02 | Discharge: 2012-06-02 | Disposition: A | Discharge status: Home / Self Care | Payer: Medicaid Other | Source: Ambulatory Visit | Attending: Emergency Medicine | Admitting: Emergency Medicine

## 2012-06-02 DIAGNOSIS — R102 Pelvic and perineal pain: Secondary | ICD-10-CM

## 2012-06-02 DIAGNOSIS — R52 Pain, unspecified: Secondary | ICD-10-CM

## 2012-06-02 DIAGNOSIS — N3010 Interstitial cystitis (chronic) without hematuria: Secondary | ICD-10-CM | POA: Insufficient documentation

## 2012-06-02 DIAGNOSIS — Z9109 Other allergy status, other than to drugs and biological substances: Secondary | ICD-10-CM | POA: Insufficient documentation

## 2012-06-02 DIAGNOSIS — R112 Nausea with vomiting, unspecified: Secondary | ICD-10-CM | POA: Insufficient documentation

## 2012-06-02 DIAGNOSIS — F3289 Other specified depressive episodes: Secondary | ICD-10-CM | POA: Insufficient documentation

## 2012-06-02 DIAGNOSIS — Z91040 Latex allergy status: Secondary | ICD-10-CM | POA: Insufficient documentation

## 2012-06-02 DIAGNOSIS — Z885 Allergy status to narcotic agent status: Secondary | ICD-10-CM | POA: Insufficient documentation

## 2012-06-02 DIAGNOSIS — N949 Unspecified condition associated with female genital organs and menstrual cycle: Secondary | ICD-10-CM | POA: Insufficient documentation

## 2012-06-02 DIAGNOSIS — Z975 Presence of (intrauterine) contraceptive device: Secondary | ICD-10-CM | POA: Insufficient documentation

## 2012-06-02 DIAGNOSIS — Z888 Allergy status to other drugs, medicaments and biological substances status: Secondary | ICD-10-CM | POA: Insufficient documentation

## 2012-06-02 DIAGNOSIS — R109 Unspecified abdominal pain: Secondary | ICD-10-CM

## 2012-06-02 DIAGNOSIS — Z79899 Other long term (current) drug therapy: Secondary | ICD-10-CM | POA: Insufficient documentation

## 2012-06-02 DIAGNOSIS — G43909 Migraine, unspecified, not intractable, without status migrainosus: Secondary | ICD-10-CM | POA: Insufficient documentation

## 2012-06-02 DIAGNOSIS — F17200 Nicotine dependence, unspecified, uncomplicated: Secondary | ICD-10-CM | POA: Insufficient documentation

## 2012-06-02 DIAGNOSIS — F329 Major depressive disorder, single episode, unspecified: Secondary | ICD-10-CM | POA: Insufficient documentation

## 2012-06-02 DIAGNOSIS — N854 Malposition of uterus: Secondary | ICD-10-CM | POA: Insufficient documentation

## 2012-06-02 DIAGNOSIS — D649 Anemia, unspecified: Secondary | ICD-10-CM | POA: Insufficient documentation

## 2012-06-02 DIAGNOSIS — Z9884 Bariatric surgery status: Secondary | ICD-10-CM | POA: Insufficient documentation

## 2012-06-02 LAB — URINALYSIS, ROUTINE W REFLEX MICROSCOPIC
Glucose, UA: NEGATIVE mg/dL
Ketones, ur: NEGATIVE mg/dL
Leukocytes, UA: NEGATIVE
pH: 6.5 (ref 5.0–8.0)

## 2012-06-02 LAB — POCT PREGNANCY, URINE: Preg Test, Ur: NEGATIVE

## 2012-06-02 MED ORDER — ONDANSETRON 8 MG PO TBDP
8.0000 mg | ORAL_TABLET | Freq: Once | ORAL | Status: AC
  Administered 2012-06-02: 8 mg via ORAL
  Filled 2012-06-02: qty 1

## 2012-06-02 MED ORDER — OXYCODONE-ACETAMINOPHEN 5-325 MG PO TABS
1.0000 | ORAL_TABLET | Freq: Once | ORAL | Status: AC
  Administered 2012-06-02: 1 via ORAL
  Filled 2012-06-02: qty 1

## 2012-06-02 NOTE — ED Provider Notes (Signed)
History     CSN: [id number]  Arrival date & time 06/02/12  0040   First MD Initiated Contact with Patient 06/02/12 0045      Chief Complaint  Patient presents with  . Abdominal Pain     Patient is a 26 y.o. female presenting with abdominal pain. The history is provided by the patient and the spouse.  Abdominal Pain The primary symptoms of the illness include abdominal pain, nausea, vomiting and diarrhea. The primary symptoms of the illness do not include dysuria or vaginal bleeding. The current episode started more than 2 days ago. The onset of the illness was gradual. The problem has been gradually worsening.  Additional symptoms associated with the illness include back pain.  PT reports abdominal pain for over a month, and reports pain worsened and now radiating to back Denies dysuria, fever She does report nonbloody emesis and loose stool that is nonbloody She has had this abdominal pain previously She is scheduled to see her GI physician early next month Denies cp/sob/cough  Past Medical History  Diagnosis Date  . Migraines   . Interstitial cystitis   . IUD FEB 2010  . Gastritis JULY 2011  . Depression   . 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 Fatty liver   . Obesity (BMI 30-39.9) 2011 228 LBS BMI 36.8  . Iron deficiency anemia 07/23/2010  . Irritable bowel syndrome 2012 DIARRHEA    JUN 2012 TTG IgA 14.9  . Elevated liver enzymes JUL 2011ALK PHOS 111-127 AST  143-267 ALT  213-321T BILI 0.6  ALB  3.7-4.06 Jun 2011 ALK PHOS 118 AST 24 ALT 42 T BILI 0.4 ALB 3.9  . Chronic daily headache     Past Surgical History  Procedure Date  . Gab 2007    in [place]-POUCH 5 CM  . Cholecystectomy 2005    biliary dyskinesia  . Cath self every nite     for sodium bicarb injection  . Colonoscopy JUN 2012 ABD PN/DIARRHEA WITH PROPOFOL    NL COLON  . Upper gastrointestinal endoscopy JULY 2011 NAUSEA-D125,V6, PH 25    Bx; GASTRITIS, POUCH-5 CM LONG  . Dilation and curettage  of uterus     Family History  Problem Relation Age of Onset  . Hemochromatosis Maternal Grandmother   . Migraines Maternal Grandmother   . Cancer Maternal Grandmother   . Hypertension Father   . Diabetes Father   . Coronary artery disease Father   . Migraines Paternal Grandmother   . Cancer Mother   . Coronary artery disease Paternal Grandfather     History  Substance Use Topics  . Smoking status: Current Everyday Smoker -- 0.5 packs/day    Types: Cigarettes  . Smokeless tobacco: Not on file  . Alcohol Use: No    OB History    Grav Para Term Preterm Abortions TAB SAB Ect Mult Living   1 1 1       1       Review of Systems  Gastrointestinal: Positive for nausea, vomiting, abdominal pain and diarrhea.  Genitourinary: Negative for dysuria and vaginal bleeding.  Musculoskeletal: Positive for back pain.  All other systems reviewed and are negative.    Allergies  Adhesive; Codeine; Gabapentin; Latex; and Metoclopramide hcl  Home Medications   Current Outpatient Rx  Name Route Sig Dispense Refill  . BIOTIN PO Oral Take 1 tablet by mouth daily.    Marland Kitchen CLOBETASOL PROPIONATE 0.05 % EX  CREA Topical Apply topically 2 (two) times daily. For skin disorder.    Marland Kitchen DICYCLOMINE HCL 10 MG PO CAPS Oral Take 1 capsule (10 mg total) by mouth 4 (four) times daily -  before meals and at bedtime. For smooth muscle cramping.    . DULOXETINE HCL 60 MG PO CPEP Oral Take 1 capsule (60 mg total) by mouth daily. For depression. 30 capsule 0  . ELETRIPTAN HYDROBROMIDE 40 MG PO TABS Oral One tablet by mouth as needed for migraine headache.  If the headache improves and then returns, dose may be repeated after 2 hours have elapsed since first dose (do not exceed 80 mg per day). may repeat in 2 hours if necessary     . FERUMOXYTOL INJECTION 510 MG/17 ML Intravenous Inject 510 mg into the vein every 6 (six) months. Iron Levels    . FLAVOXATE HCL 100 MG PO TABS Oral Take 1 tablet (100 mg total) by mouth 4  (four) times daily as needed (for spasms.). 30 tablet   . IBUPROFEN 200 MG PO TABS Oral Take 3 tablets (600 mg total) by mouth every 6 (six) hours as needed for pain.    Marland Kitchen LEVONORGESTREL 20 MCG/24HR IU IUD Intrauterine 1 each by Intrauterine route once.      Marland Kitchen LORATADINE 10 MG PO TABS Oral Take 1 tablet (10 mg total) by mouth at bedtime. For allergies.    . OMEPRAZOLE 20 MG PO CPDR Oral Take 1 capsule (20 mg total) by mouth 2 (two) times daily. For acid reflux    . PENTOSAN POLYSULFATE SODIUM 100 MG PO CAPS Oral Take 1 capsule (100 mg total) by mouth 3 (three) times daily before meals. Take per urologist directions, for interstitial cystitis.    Marland Kitchen PHENERGAN 25 MG PO TABS  TAKE 1/2-1 TABLET BY MOUTH EVERY 6 HOURS AS NEEDED FOR NAUSEA OR VOMITING. 30 each 1  . QUETIAPINE FUMARATE 200 MG PO TABS Oral Take 1 tablet (200 mg total) by mouth at bedtime. For sleep and calm thoughts. 30 tablet 0    BP 111/62  Pulse 90  Temp 97.8 F (36.6 C) (Oral)  Resp 18  Ht 5\' 11"  (1.803 m)  Wt 200 lb (90.719 kg)  BMI 27.89 kg/m2  SpO2 99%  Physical Exam CONSTITUTIONAL: Well developed/well nourished HEAD AND FACE: Normocephalic/atraumatic EYES: EOMI/PERRL ENMT: Mucous membranes moist NECK: supple no meningeal signs SPINE:entire spine nontender CV: S1/S2 noted, no murmurs/rubs/gallops noted LUNGS: Lungs are clear to auscultation bilaterally, no apparent distress ABDOMEN: soft, nontender, no rebound or guarding GU:no cva tenderness NEURO: Pt is awake/alert, moves all extremitiesx4 EXTREMITIES: pulses normal, full ROM SKIN: warm, color normal PSYCH: no abnormalities of mood noted  ED Course  Procedures    Labs Reviewed  URINALYSIS, ROUTINE W REFLEX MICROSCOPIC  POCT PREGNANCY, URINE   1:27 AM Pt with h/o chronic abdominal pain now worsening On last ED visit, we had discussed possibility of outpatient pelvic ultrasound given lower abdominal pain but she has not had [name] as of yet.  Will order  outpatient pelvic US for her and advised f/u as outpatient.  She is well appearing and nontoxic.  I doubt appendicitis.  I doubt acute TOA or ovarian torsion, therefore [name] can be done outside of the ED  On last visit, pt did not wish to have narcotics.  However on this visit patient requests (and spouse approves) to have narcotics.    Pt had pelvic exam on last ED visit   MDM  Nursing notes including past medical history and social history reviewed and considered in documentation All labs/vitals reviewed and considered Previous records reviewed and considered - recent ED visit for similar type abdominal pain         Joya Gaskins, MD 06/02/12 [phone]

## 2012-06-02 NOTE — ED Notes (Signed)
Pt states ABD pain started 3 days ago; tried to make appointment with PCP; PCP unable to see pt due to outstanding balance; Pt states nausea and vomiting with loose stools which pt states is not uncommon for her. Pt has been able to tolerate liquids

## 2012-06-02 NOTE — ED Provider Notes (Signed)
Ultrasound report significant hemmorhagic right ovarian cyst.  Patient informed of results and advised follow up with her gyn(Dr. Emelda Fear).  Advised tyleonol and motrin for pain.   Hilario Quarry, MD 06/02/12 1504

## 2012-06-02 NOTE — Discharge Instructions (Signed)
Abdominal (belly) pain can be caused by many things. any cases can be observed and treated at home after initial evaluation in the emergency department. Even though you are being discharged home, abdominal pain can be unpredictable. Therefore, you need a repeated exam if your pain does not resolve, returns, or worsens. Most patients with abdominal pain don't have to be admitted to the hospital or have surgery, but serious problems like appendicitis and gallbladder attacks can start out as nonspecific pain. Many abdominal conditions cannot be diagnosed in one visit, so follow-up evaluations are very important. °SEEK IMMEDIATE MEDICAL ATTENTION IF: °The pain does not go away or becomes severe, particularly over the next 8-12 hours.  °A temperature above 100.4F develops.  °Repeated vomiting occurs (multiple episodes).  °The pain becomes localized to portions of the abdomen. The right side could possibly be appendicitis. In an adult, the left lower portion of the abdomen could be colitis or diverticulitis.  °Blood is being passed in stools or vomit (bright red or black tarry stools).  °Return also if you develop chest pain, difficulty breathing, dizziness or fainting, or become confused, poorly responsive ° °

## 2012-06-02 NOTE — ED Notes (Signed)
Pt stable at discharge with steady gait

## 2012-06-03 ENCOUNTER — Emergency Department (HOSPITAL_COMMUNITY)
Admission: EM | Admit: 2012-06-03 | Discharge: 2012-06-03 | Disposition: A | Discharge status: Home / Self Care | Payer: BC Managed Care – PPO | Attending: Emergency Medicine | Admitting: Emergency Medicine

## 2012-06-03 ENCOUNTER — Encounter (HOSPITAL_COMMUNITY): Payer: Self-pay | Admitting: Emergency Medicine

## 2012-06-03 DIAGNOSIS — G8929 Other chronic pain: Secondary | ICD-10-CM | POA: Insufficient documentation

## 2012-06-03 DIAGNOSIS — E669 Obesity, unspecified: Secondary | ICD-10-CM | POA: Insufficient documentation

## 2012-06-03 DIAGNOSIS — F329 Major depressive disorder, single episode, unspecified: Secondary | ICD-10-CM | POA: Insufficient documentation

## 2012-06-03 DIAGNOSIS — Z79899 Other long term (current) drug therapy: Secondary | ICD-10-CM | POA: Insufficient documentation

## 2012-06-03 DIAGNOSIS — Z6830 Body mass index (BMI) 30.0-30.9, adult: Secondary | ICD-10-CM | POA: Insufficient documentation

## 2012-06-03 DIAGNOSIS — K589 Irritable bowel syndrome without diarrhea: Secondary | ICD-10-CM | POA: Insufficient documentation

## 2012-06-03 DIAGNOSIS — F17200 Nicotine dependence, unspecified, uncomplicated: Secondary | ICD-10-CM | POA: Insufficient documentation

## 2012-06-03 DIAGNOSIS — R1013 Epigastric pain: Secondary | ICD-10-CM | POA: Insufficient documentation

## 2012-06-03 DIAGNOSIS — F3289 Other specified depressive episodes: Secondary | ICD-10-CM | POA: Insufficient documentation

## 2012-06-03 MED ORDER — GI COCKTAIL ~~LOC~~
30.0000 mL | Freq: Once | ORAL | Status: AC
  Administered 2012-06-03: 30 mL via ORAL
  Filled 2012-06-03: qty 30

## 2012-06-03 MED ORDER — DROPERIDOL 2.5 MG/ML IJ SOLN
2.5000 mg | Freq: Once | INTRAMUSCULAR | Status: AC
  Administered 2012-06-03: 11:00:00 via INTRAMUSCULAR
  Filled 2012-06-03: qty 2

## 2012-06-03 MED ORDER — FAMOTIDINE 20 MG PO TABS
20.0000 mg | ORAL_TABLET | Freq: Once | ORAL | Status: DC
  Filled 2012-06-03: qty 1

## 2012-06-03 MED ORDER — PANTOPRAZOLE SODIUM 20 MG PO TBEC: DELAYED_RELEASE_TABLET | ORAL | Status: DC

## 2012-06-03 MED ORDER — PROMETHAZINE HCL 25 MG RE SUPP: 25.0000 mg | Freq: Four times a day (QID) | RECTAL | Status: DC | PRN

## 2012-06-03 MED ORDER — ONDANSETRON 8 MG PO TBDP
8.0000 mg | ORAL_TABLET | Freq: Once | ORAL | Status: AC
  Administered 2012-06-03: 8 mg via ORAL
  Filled 2012-06-03: qty 1

## 2012-06-03 NOTE — ED Notes (Signed)
Patient arrived via EMS> Alert and oriented. Airway patent. Patient c/o upper right abd pain that woke her this morning, pain slightly relieved with pressure. Patient given Toradol 60mg  IM, left deltoid, by EMS. Patient reports nausea but no vomiting or diarrhea. Right ovarian cyst hemorrage discovered yesterday with ultrasound.

## 2012-06-03 NOTE — ED Provider Notes (Signed)
History  This chart was scribed for Ward Givens, MD by Bennett Scrape. This patient was seen in room [facility]08/[facility]08 and the patient's care was started at 9:39AM.  CSN: [id number]  Arrival date & time 06/03/12  [id number]   First MD Initiated Contact with Patient 06/03/12 [phone]      Chief Complaint  Patient presents with  . Abdominal Pain     Patient is a 26 y.o. female presenting with abdominal pain. The history is provided by the patient. No language interpreter was used.  Abdominal Pain The primary symptoms of the illness include abdominal pain. The current episode started 1 to 2 hours ago. The onset of the illness was sudden. The problem has not changed since onset. The abdominal pain is located in the epigastric region. The abdominal pain does not radiate. The abdominal pain is exacerbated by deep breathing and eating.  The patient states that she believes she is currently not pregnant. The patient has not had a change in bowel habit. Significant associated medical issues include inflammatory bowel disease. Significant associated medical issues do not include GERD or diabetes.    SERRINA MINOGUE is a 26 y.o. female with a h/o gastritis who presents to the Emergency Department complaining of approximately 2 hours of sudden onset, non-changing, constant epigastric abdominal pain with associated nausea that woke her up out of sleep. EMS gave pt 60 mg Toradol IM en route which she states improved her symptoms. She describes the pain as being a sharp and burning sensation that is non-radiating in nature. She reports that the pain is worse with deep breathing, eating and sitting up and improved with pressure. She reports having similar less severe episodes of epigastric pain for the past 3 months that she attributes to stomach ulcers. She reports that she is currently taking Prilosec and is following up with Dr. Darrick Penna but couldn't get an appointment to see her. She was also seen here yesterday and was  diagnosed with a right ovarian cyst hemorrhage yesterday through [name]. She c/o RLQ abdominal pain that radiates to the right lower back pain that she attributes to an ovarian cyst but denies any changes in that pain. She denies having a h/o ovarian problems. She reports that she is going to follow up with both problems with her PCP in two days. She denies urinary symptoms, diarrhea, emesis, rash, chest pain, fever, and HA as associated symptoms. She has a h/o migraines, IBS and depression. She is a current everyday smoker and occasional alcohol user.  Review of her chart shows she was admitted to [facility] on 4/4 for opiate addiction.  Review of [facility]  Today shows no scripts filled for narcotics since that admission    PCP is [employer]. GI Dr Darrick Penna  Past Medical History  Diagnosis Date  . Migraines   . Interstitial cystitis   . IUD FEB 2010  . Gastritis JULY 2011  . Depression   . 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 Fatty liver   . Obesity (BMI 30-39.9) 2011 228 LBS BMI 36.8  . Iron deficiency anemia 07/23/2010  . Irritable bowel syndrome 2012 DIARRHEA    JUN 2012 TTG IgA 14.9  . Elevated liver enzymes JUL 2011ALK PHOS 111-127 AST  143-267 ALT  213-321T BILI 0.6  ALB  3.7-4.06 Jun 2011 ALK PHOS 118 AST 24 ALT 42 T BILI 0.4 ALB 3.9  . Chronic daily headache     Past Surgical  History  Procedure Date  . Gab 2007    in [place]-POUCH 5 CM  . Cholecystectomy 2005    biliary dyskinesia  . Cath self every nite     for sodium bicarb injection  . Colonoscopy JUN 2012 ABD PN/DIARRHEA WITH PROPOFOL    NL COLON  . Upper gastrointestinal endoscopy JULY 2011 NAUSEA-D125,V6, PH 25    Bx; GASTRITIS, POUCH-5 CM LONG  . Dilation and curettage of uterus   . Tonsillectomy   gastric bypass in 2007 by Dr. Clent Ridges in [facility]  Family History  Problem Relation Age of Onset  . Hemochromatosis Maternal Grandmother   . Migraines Maternal Grandmother   . Cancer Maternal Grandmother   . Hypertension  Father   . Diabetes Father   . Coronary artery disease Father   . Migraines Paternal Grandmother   . Cancer Mother   . Coronary artery disease Paternal Grandfather     History  Substance Use Topics  . Smoking status: Current Everyday Smoker -- 0.5 packs/day for 5 years    Types: Cigarettes  . Smokeless tobacco: Never Used  . Alcohol Use: Yes     occasionally  lives with spouse  OB History    Grav Para Term Preterm Abortions TAB SAB Ect Mult Living   2 1 1  1  1   1       Review of Systems  Gastrointestinal: Positive for abdominal pain.  All other systems reviewed and are negative.    Allergies  Codeine; Gabapentin; Latex; Metoclopramide hcl; and Adhesive  Home Medications   Current Outpatient Rx  Name Route Sig Dispense Refill  . BIOTIN PO Oral Take 1 tablet by mouth daily.    Marland Kitchen CLOBETASOL PROPIONATE 0.05 % EX CREA Topical Apply topically 2 (two) times daily. For skin disorder.    Marland Kitchen DICYCLOMINE HCL 10 MG PO CAPS Oral Take 1 capsule (10 mg total) by mouth 4 (four) times daily -  before meals and at bedtime. For smooth muscle cramping.    . DULOXETINE HCL 60 MG PO CPEP Oral Take 1 capsule (60 mg total) by mouth daily. For depression. 30 capsule 0  . ELETRIPTAN HYDROBROMIDE 40 MG PO TABS Oral One tablet by mouth as needed for migraine headache.  If the headache improves and then returns, dose may be repeated after 2 hours have elapsed since first dose (do not exceed 80 mg per day). may repeat in 2 hours if necessary     . FERUMOXYTOL INJECTION 510 MG/17 ML Intravenous Inject 510 mg into the vein every 6 (six) months. Iron Levels    . FLAVOXATE HCL 100 MG PO TABS Oral Take 1 tablet (100 mg total) by mouth 4 (four) times daily as needed (for spasms.). 30 tablet   . IBUPROFEN 200 MG PO TABS Oral Take 3 tablets (600 mg total) by mouth every 6 (six) hours as needed for pain.    Marland Kitchen LEVONORGESTREL 20 MCG/24HR IU IUD Intrauterine 1 each by Intrauterine route once.      Marland Kitchen LORATADINE 10  MG PO TABS Oral Take 1 tablet (10 mg total) by mouth at bedtime. For allergies.    . OMEPRAZOLE 20 MG PO CPDR Oral Take 1 capsule (20 mg total) by mouth 2 (two) times daily. For acid reflux    . PENTOSAN POLYSULFATE SODIUM 100 MG PO CAPS Oral Take 1 capsule (100 mg total) by mouth 3 (three) times daily before meals. Take per urologist directions, for interstitial cystitis.    Marland Kitchen  QUETIAPINE FUMARATE 200 MG PO TABS Oral Take 1 tablet (200 mg total) by mouth at bedtime. For sleep and calm thoughts. 30 tablet 0    Triage Vitals: BP 107/62  Pulse 72  Temp 98.4 F (36.9 C) (Oral)  Resp 20  Ht 5\' 11"  (1.803 m)  Wt 215 lb (97.523 kg)  BMI 29.99 kg/m2  SpO2 99%  Vital signs normal    Physical Exam  Nursing note and vitals reviewed. Constitutional: She is oriented to person, place, and time. She appears well-developed and well-nourished.  Non-toxic appearance. She does not appear ill. No distress.  HENT:  Head: Normocephalic and atraumatic.  Right Ear: External ear normal.  Left Ear: External ear normal.  Nose: Nose normal. No mucosal edema or rhinorrhea.  Mouth/Throat: Oropharynx is clear and moist and mucous membranes are normal. No dental abscesses or uvula swelling.  Eyes: Conjunctivae and EOM are normal. Pupils are equal, round, and reactive to light.  Neck: Normal range of motion and full passive range of motion without pain. Neck supple. No tracheal deviation present.  Cardiovascular: Normal rate, regular rhythm and normal heart sounds.  Exam reveals no gallop and no friction rub.   No murmur heard. Pulmonary/Chest: Effort normal and breath sounds normal. No respiratory distress. She has no wheezes. She has no rhonchi. She has no rales. She exhibits no tenderness and no crepitus.  Abdominal: Soft. Normal appearance and bowel sounds are normal. She exhibits no distension. There is tenderness (mild epigastric tenderness). There is no rebound and no guarding.  Musculoskeletal: Normal range  of motion. She exhibits no edema and no tenderness.       Moves all extremities well.   Neurological: She is alert and oriented to person, place, and time. She has normal strength. No cranial nerve deficit.  Skin: Skin is warm, dry and intact. No rash noted. No erythema. No pallor.  Psychiatric: She has a normal mood and affect. Her speech is normal and behavior is normal. Her mood appears not anxious.    ED Course  Procedures (including critical care time)  DIAGNOSTIC STUDIES: Oxygen Saturation is 99% on room air, normal by my interpretation.    COORDINATION OF CARE: 9:50AM-Discussed treatment plan which includes an [name] with pt and pt agreed.  9:57AM-US started. Ended at 10:05AM No fluid noted in the abdominal cavity. Right ovarian cyst under 2 cm noted but no drainage seen. Heart and bladder were normal.  FAST BEDSIDE US Indication: possible free fluid from ruptured ovarian cyst 4 Views obtained: Splenorenal, Morrison's Pouch, Retrovesical, Pericardial No free fluid in abdomen No pericardial effusion No difficulty obtaining views. Archived electronically I personally performed and interrepreted the images   10:24AM-Pt was admitted May 4th, 2013 for narcotic dependence and has not had any narcotic prescriptions since being discharged.  10:43AM-Discussed further treatment plan with pt and pt's family. Family is requesting that I give the pt narcotic pain medication which I am refusing to do due to pt's past narcotic abuse.  1:02PM-Pt rechecked and is sleeping comfortably. She reports her abdominal pain is improved. She states that she feels well enough to be discharged home. Advised pt to continue using tylenol for her symptoms.   Medications  ondansetron (ZOFRAN-ODT) disintegrating tablet 8 mg (8 mg Oral Given 06/03/12 1022)  gi cocktail (Maalox,Lidocaine,Donnatal) (30 mL Oral Given 06/03/12 1115)  droperidol (INAPSINE) injection 2.5 mg (  Intramuscular Given 06/03/12 1053)   No  labs done today  US Transvaginal Non-ob US Pelvis Complete US  Art/ven Flow Abd Pelv Doppler  06/02/2012  *RADIOLOGY REPORT*  Clinical Data:  Severe right pelvic pain. Clinical suspicion for ovarian torsion.  TRANSABDOMINAL AND TRANSVAGINAL ULTRASOUND OF PELVIS DOPPLER ULTRASOUND OF OVARIES  Technique:  Both transabdominal and transvaginal ultrasound examinations of the pelvis were performed. Transabdominal technique was performed for global imaging of the pelvis including uterus, ovaries, adnexal regions, and pelvic cul-de-sac.  It was necessary to proceed with endovaginal exam following the transabdominal exam to visualize the IUD and ovaries.  Color and duplex Doppler ultrasound was utilized to evaluate blood flow to the ovaries.  Comparison:  10/27/2009  Findings:  Uterus:  6.4 x 5.2 x 4.8 cm.  Uterus is retroverted.  No fibroids or other uterine mass identified.  Endometrium:  IUD seen in appropriate location in the endometrial cavity.  Double layer endometrial thickness measures 8 mm transvaginally.  No focal lesion visualized.  Right ovary: 3.9 x 2.0 x 2.7 cm.  1.8 cm hemorrhagic cyst seen with benign features.  No ovarian or adnexal mass identified.  Left ovary:   3.8 x 1.2 x 2.3 cm.  Normal appearance.  Pulsed Doppler evaluation demonstrates normal low-resistance arterial and venous waveforms in both ovaries.  IMPRESSION:  1.  1.8 cm hemorrhagic right ovarian cyst noted. 2.  No sonographic evidence for ovarian torsion or other significant abnormality. 3.  Retroverted uterus, with IUD in normal location.  Original Report Authenticated By: Danae Orleans, M.D.    Date: 06/03/2012  Rate: 67  Rhythm: normal sinus rhythm  QRS Axis: normal  Intervals: normal  ST/T Wave abnormalities: normal  Conduction Disutrbances:none  Narrative Interpretation:   Old EKG Reviewed: unchanged from 05/04/2011    1. Epigastric abdominal pain   2. Chronic abdominal pain    New Prescriptions   PANTOPRAZOLE  (PROTONIX) 20 MG TABLET    Take 1 po BID x 2 weeks then once a day   PROMETHAZINE (PHENERGAN) 25 MG SUPPOSITORY    Place 1 suppository (25 mg total) rectally every 6 (six) hours as needed for nausea.   Plan discharge    Devoria Albe, MD, FACEP     MDM   I personally performed the services described in this documentation, which was scribed in my presence. The recorded information has been reviewed and considered.  Devoria Albe, MD, FACEP    Ward Givens, MD 06/03/12 1330

## 2012-06-03 NOTE — ED Notes (Signed)
EDPA is in with the pt at this time. 

## 2012-06-03 NOTE — ED Notes (Signed)
Patient is resting at this time.

## 2012-06-03 NOTE — ED Notes (Signed)
Pt states she cannot take the meds due to nausea, EDP aware. Pt's mom wants to talk with the doctor about doing some tests to find out what is going on. EDP aware. EDP states to give her zofran first and then when the nausea is better give the pecid and gi cocktail.

## 2012-06-03 NOTE — Discharge Instructions (Signed)
Avoid fried, spicey or greasy foods. Try to stop taking the ibuprofen. You can safely take tylenol, not to exceed 4 grams a day. Stop the omeprazole and try the protonix. You can try maalox or mylanta for pain not relieved by the protonix. Call Dr Evelina Dun office to get an appointment to see her soon.  You can have the ovarian cyst that was found on your last visit rechecked by Dr Emelda Fear as you were instructed before.

## 2012-06-05 ENCOUNTER — Ambulatory Visit: Payer: Self-pay | Admitting: Gastroenterology

## 2012-06-06 ENCOUNTER — Encounter: Payer: Self-pay | Admitting: Gastroenterology

## 2012-06-06 ENCOUNTER — Ambulatory Visit (INDEPENDENT_AMBULATORY_CARE_PROVIDER_SITE_OTHER): Payer: Medicaid Other | Admitting: Gastroenterology

## 2012-06-06 VITALS — BP 121/78 | HR 88 | Temp 98.1°F | Ht 71.0 in | Wt 206.0 lb

## 2012-06-06 DIAGNOSIS — R1013 Epigastric pain: Secondary | ICD-10-CM

## 2012-06-06 DIAGNOSIS — R1110 Vomiting, unspecified: Secondary | ICD-10-CM

## 2012-06-06 DIAGNOSIS — Z862 Personal history of diseases of the blood and blood-forming organs and certain disorders involving the immune mechanism: Secondary | ICD-10-CM

## 2012-06-06 DIAGNOSIS — Z8639 Personal history of other endocrine, nutritional and metabolic disease: Secondary | ICD-10-CM

## 2012-06-06 LAB — CBC
HCT: 36.3 % (ref 36.0–46.0)
Hemoglobin: 12.6 g/dL (ref 12.0–15.0)
MCH: 31.7 pg (ref 26.0–34.0)
MCV: 91.2 fL (ref 78.0–100.0)
RBC: 3.98 MIL/uL (ref 3.87–5.11)

## 2012-06-06 NOTE — Progress Notes (Signed)
Primary Care Physician: [name], MD  Primary Gastroenterologist:  Jonette Eva, MD  Chief Complaint  Patient presents with  . Abdominal Pain  . Emesis  . Nausea    HPI: Penny [name] is a 26 y.o. female here for further evaluation of RLQ pain, N/V. Patient has been seen in ED 05/03/12,  06/02/12, 06/03/12.  Initial ED visit on 05/03/12 she had pelvic exam and GC/chylamydia/wet prep which was unremarkable. U/A unremarkable as well. She was found to have hemorrhogic ovarian cyst on 06/02/12 by ultrasound and has appt to see Dr. Emelda Fear. Saturday morning 06/03/12, woke up epigastric pain, fetal position, movement caused worse pain, lots of nausea. EMS transported her to [facility].She was given supportive measures and released.    Patient given protonix to take instead of prilosec but she has not had it filled yet.  She states symptoms have been going on for two months,  epig pain and n/v. Happens with all food, doesn't matter if bland. Never experienced this type of pain before. She is s/p cholecystectomy at age 20 for biliary dyskinesia. Heartburn not that bad. Vomiting within 10-15 minutes after meals. No hematemsis. BM alternating constipation/diarrhea, regular for her.   H/O inpatient detox for narcotic dependence 04/08/12.   Current Outpatient Prescriptions  Medication Sig Dispense Refill  . clobetasol cream (TEMOVATE) 0.05 % Apply topically 2 (two) times daily. For skin disorder.      . dicyclomine (BENTYL) 10 MG capsule Take 10 mg by mouth 4 (four) times daily -  before meals and at bedtime. For smooth muscle cramping.      . eletriptan (RELPAX) 40 MG tablet One tablet by mouth as needed for migraine headache.  If the headache improves and then returns, dose may be repeated after 2 hours have elapsed since first dose (do not exceed 80 mg per day). may repeat in 2 hours if necessary       . ferumoxytol (FERAHEME) 510 MG/17ML SOLN Inject 510 mg into the vein every 6 (six) months. Iron Levels        . ibuprofen (ADVIL,MOTRIN) 200 MG tablet Take 600 mg by mouth every 6 (six) hours as needed.      Marland Kitchen levonorgestrel (MIRENA) 20 MCG/24HR IUD 1 each by Intrauterine route once.        Marland Kitchen omeprazole (PRILOSEC) 20 MG capsule Take 20 mg by mouth 2 (two) times daily. For acid reflux      . pantoprazole (PROTONIX) 20 MG tablet Take 1 po BID x 2 weeks then once a day  60 tablet  0  . promethazine (PHENERGAN) 25 MG suppository Place 1 suppository (25 mg total) rectally every 6 (six) hours as needed for nausea.  10 each  0  . promethazine (PHENERGAN) 25 MG tablet Take 12.5-25 mg by mouth every 6 (six) hours as needed. For nausea/vomiting      . QUEtiapine (SEROQUEL) 200 MG tablet Take 1 tablet (200 mg total) by mouth at bedtime. For sleep and calm thoughts.  30 tablet  0    Allergies as of 06/06/2012 - Review Complete 06/06/2012  Allergen Reaction Noted  . Codeine    . Gabapentin    . Latex    . Metoclopramide hcl  05/27/2011  . Adhesive (tape) Rash 03/08/2012    ROS:  General: Negative for anorexia,  fever, chills, fatigue, weakness. Weight down 15 pounds since 12/2011. States more active now off narcotics. ENT: Negative for hoarseness, difficulty swallowing , nasal congestion. CV: Negative for chest pain,  angina, palpitations, dyspnea on exertion, peripheral edema.  Respiratory: Negative for dyspnea at rest, dyspnea on exertion, cough, sputum, wheezing.  GI: See history of present illness. GU:  Negative for dysuria, hematuria, urinary incontinence, urinary frequency, nocturnal urination.  Endo: Negative for unusual weight change.    Physical Examination:   BP 121/78  Pulse 88  Temp 98.1 F (36.7 C) (Temporal)  Ht 5\' 11"  (1.803 m)  Wt 206 lb (93.441 kg)  BMI 28.73 kg/m2  General: Well-nourished, well-developed in no acute distress.  Eyes: No icterus. Mouth: Oropharyngeal mucosa moist and pink , no lesions erythema or exudate. Lungs: Clear to auscultation bilaterally.  Heart: Regular  rate and rhythm, no murmurs rubs or gallops.  Abdomen: Bowel sounds are normal, mild to moderate epigastric pain, nondistended, no hepatosplenomegaly or masses, no abdominal bruits or hernia , no rebound or guarding.   Extremities: No lower extremity edema. No clubbing or deformities. Neuro: Alert and oriented x 4   Skin: Warm and dry, no jaundice.   Psych: Alert and cooperative, normal mood and affect.   Imaging Studies: US Transvaginal Non-ob  2012-06-05  *RADIOLOGY REPORT*  Clinical Data:  Severe right pelvic pain. Clinical suspicion for ovarian torsion.  TRANSABDOMINAL AND TRANSVAGINAL ULTRASOUND OF PELVIS DOPPLER ULTRASOUND OF OVARIES  Technique:  Both transabdominal and transvaginal ultrasound examinations of the pelvis were performed. Transabdominal technique was performed for global imaging of the pelvis including uterus, ovaries, adnexal regions, and pelvic cul-de-sac.  It was necessary to proceed with endovaginal exam following the transabdominal exam to visualize the IUD and ovaries.  Color and duplex Doppler ultrasound was utilized to evaluate blood flow to the ovaries.  Comparison:  10/27/2009  Findings:  Uterus:  6.4 x 5.2 x 4.8 cm.  Uterus is retroverted.  No fibroids or other uterine mass identified.  Endometrium:  IUD seen in appropriate location in the endometrial cavity.  Double layer endometrial thickness measures 8 mm transvaginally.  No focal lesion visualized.  Right ovary: 3.9 x 2.0 x 2.7 cm.  1.8 cm hemorrhagic cyst seen with benign features.  No ovarian or adnexal mass identified.  Left ovary:   3.8 x 1.2 x 2.3 cm.  Normal appearance.  Pulsed Doppler evaluation demonstrates normal low-resistance arterial and venous waveforms in both ovaries.  IMPRESSION:  1.  1.8 cm hemorrhagic right ovarian cyst noted. 2.  No sonographic evidence for ovarian torsion or other significant abnormality. 3.  Retroverted uterus, with IUD in normal location.  Original Report Authenticated By: Danae Orleans, M.D.

## 2012-06-07 ENCOUNTER — Encounter: Payer: Self-pay | Admitting: Gastroenterology

## 2012-06-07 LAB — HEPATIC FUNCTION PANEL
ALT: 144 U/L — ABNORMAL HIGH (ref 0–35)
Bilirubin, Direct: 0.1 mg/dL (ref 0.0–0.3)
Indirect Bilirubin: 0.4 mg/dL (ref 0.0–0.9)

## 2012-06-07 LAB — BASIC METABOLIC PANEL
BUN: 11 mg/dL (ref 6–23)
CO2: 26 mEq/L (ref 19–32)
Chloride: 105 mEq/L (ref 96–112)
Creat: 0.6 mg/dL (ref 0.50–1.10)
Glucose, Bld: 88 mg/dL (ref 70–99)

## 2012-06-07 NOTE — Progress Notes (Signed)
Faxed to PCP

## 2012-06-07 NOTE — Assessment & Plan Note (Signed)
Epigastric pain associated with pp N/V over past two months. H/O gastric bypass. CT one year ago showed "Mild hazy stranding about the pancreatic head could reflect mild pancreatitis; no evidence for pseudocyst or abscess formation.Suggest clinical correlation with laboratory findings." Lipase normal at that time. Ordered during ED visit.   Start with labs, LFTs, lipase, CBC.

## 2012-06-09 ENCOUNTER — Emergency Department (HOSPITAL_COMMUNITY)
Admission: EM | Admit: 2012-06-09 | Discharge: 2012-06-09 | Disposition: A | Discharge status: Home / Self Care | Payer: BC Managed Care – PPO | Attending: Emergency Medicine | Admitting: Emergency Medicine

## 2012-06-09 ENCOUNTER — Telehealth: Payer: Self-pay | Admitting: Gastroenterology

## 2012-06-09 ENCOUNTER — Encounter: Payer: Self-pay | Admitting: Gastroenterology

## 2012-06-09 ENCOUNTER — Emergency Department (HOSPITAL_COMMUNITY): Payer: BC Managed Care – PPO

## 2012-06-09 ENCOUNTER — Encounter (HOSPITAL_COMMUNITY): Payer: Self-pay

## 2012-06-09 DIAGNOSIS — R945 Abnormal results of liver function studies: Secondary | ICD-10-CM

## 2012-06-09 DIAGNOSIS — Z9884 Bariatric surgery status: Secondary | ICD-10-CM | POA: Insufficient documentation

## 2012-06-09 DIAGNOSIS — D509 Iron deficiency anemia, unspecified: Secondary | ICD-10-CM | POA: Insufficient documentation

## 2012-06-09 DIAGNOSIS — R1013 Epigastric pain: Secondary | ICD-10-CM | POA: Insufficient documentation

## 2012-06-09 DIAGNOSIS — R7989 Other specified abnormal findings of blood chemistry: Secondary | ICD-10-CM

## 2012-06-09 DIAGNOSIS — G8929 Other chronic pain: Secondary | ICD-10-CM | POA: Insufficient documentation

## 2012-06-09 DIAGNOSIS — R109 Unspecified abdominal pain: Secondary | ICD-10-CM

## 2012-06-09 DIAGNOSIS — F17200 Nicotine dependence, unspecified, uncomplicated: Secondary | ICD-10-CM | POA: Insufficient documentation

## 2012-06-09 DIAGNOSIS — E669 Obesity, unspecified: Secondary | ICD-10-CM | POA: Insufficient documentation

## 2012-06-09 MED ORDER — PANTOPRAZOLE SODIUM 40 MG IV SOLR
40.0000 mg | Freq: Once | INTRAVENOUS | Status: AC
  Administered 2012-06-09: 40 mg via INTRAVENOUS
  Filled 2012-06-09: qty 40

## 2012-06-09 MED ORDER — SODIUM CHLORIDE 0.9 % IV SOLN
Freq: Once | INTRAVENOUS | Status: AC
Start: 2012-06-09 — End: 2012-06-09
  Administered 2012-06-09: 02:00:00 via INTRAVENOUS

## 2012-06-09 MED ORDER — HYDROMORPHONE HCL PF 1 MG/ML IJ SOLN
1.0000 mg | Freq: Once | INTRAMUSCULAR | Status: AC
  Administered 2012-06-09: 1 mg via INTRAVENOUS
  Filled 2012-06-09: qty 1

## 2012-06-09 MED ORDER — HYDROMORPHONE HCL PF 2 MG/ML IJ SOLN
INTRAMUSCULAR | Status: AC
  Administered 2012-06-09: 1 mg
  Filled 2012-06-09: qty 1

## 2012-06-09 MED ORDER — IBUPROFEN 800 MG PO TABS: 800.0000 mg | ORAL_TABLET | Freq: Three times a day (TID) | ORAL | Status: DC | PRN

## 2012-06-09 MED ORDER — KETOROLAC TROMETHAMINE 30 MG/ML IJ SOLN
30.0000 mg | Freq: Once | INTRAMUSCULAR | Status: AC
  Administered 2012-06-09: 30 mg via INTRAVENOUS
  Filled 2012-06-09: qty 1

## 2012-06-09 MED ORDER — ONDANSETRON HCL 4 MG/2ML IJ SOLN
4.0000 mg | Freq: Once | INTRAMUSCULAR | Status: AC
  Administered 2012-06-09: 4 mg via INTRAVENOUS
  Filled 2012-06-09: qty 2

## 2012-06-09 MED ORDER — HYDROMORPHONE HCL PF 1 MG/ML IJ SOLN: 1.0000 mg | Freq: Once | INTRAMUSCULAR | Status: DC

## 2012-06-09 MED ORDER — GI COCKTAIL ~~LOC~~
10.0000 mL | Freq: Once | ORAL | Status: AC
  Administered 2012-06-09: 10 mL via ORAL
  Filled 2012-06-09: qty 30

## 2012-06-09 MED ORDER — PROMETHAZINE HCL 25 MG PO TABS: 25.0000 mg | ORAL_TABLET | Freq: Four times a day (QID) | ORAL | Status: DC | PRN

## 2012-06-09 NOTE — ED Notes (Signed)
Pt reporting pain level improved.  Discussed pain management and follow up with pt.  Verbalized understanding.

## 2012-06-09 NOTE — ED Notes (Signed)
Pt no longer doubling over with pain.  Reports she is somewhat more comfortable at this time.

## 2012-06-09 NOTE — Telephone Encounter (Signed)
Patient called Dr. Jena Gauss on call today at noon. Had been in ED for recurrent ruq pain early this am. Woke up last night with severe pain started in RLQ and radiated up to RUQ and into back. Nausea without vomiting. Fetal position with pain. D/C from ED at 5am. ED note not available. Ate biscuit at 9am. At noon, pain started back but not quite as bad.   Seen in office 06/06/12 for same. Multiple ED visits as outlined in 06/06/12 OV note. Right hemorrhagic cysts identified on u/s. 06/06/12 labs obtained as outpatient.   Lab Results  Component Value Date   ALT 144* 06/06/2012   AST 56* 06/06/2012   ALKPHOS 107 06/06/2012   BILITOT 0.5 06/06/2012   Lab Results  Component Value Date   LIPASE 21 06/06/2012   Lab Results  Component Value Date   WBC 5.5 06/06/2012   HGB 12.6 06/06/2012   HCT 36.3 06/06/2012   MCV 91.2 06/06/2012   PLT 200 06/06/2012   Lab Results  Component Value Date   CREATININE 0.60 06/06/2012   BUN 11 06/06/2012   NA 139 06/06/2012   K 4.3 06/06/2012   CL 105 06/06/2012   CO2 26 06/06/2012   Patient has h/o abnormal AST/ALT in 06/2010 at time of accidental neurontin overdose. 06/22/11 her AST/ALT were 24/42. She has family h/o hemochromatosis. Mother and maternal GM with disease. 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    Spoke to mother, [name] as well. Patient has h/o narcotic dependence so always refused narcotics. Just detoxed earlier this year.   I question if she is having sphincter of Oddi or other biliary etiology. Preferable would like to get repeat LFTs, abd u/s. May not be doable until Monday. She needs to be on bland diet. Ibuprofen for severe pain and limit as much as possible (800mg  tid prn sent in). Called in phenergan for nausea. If needed, go back to ED and request repeat LFTs and GI evaluation.   Called radiology. Patient needs to arrive at 2pm on Monday for abd u/s. NPO after 6am. Patient is aware. She will go for labs before that for LFTs. Orders have been put in but please fax  orders if need be.

## 2012-06-09 NOTE — ED Notes (Signed)
Pt resting quietly, resting regular even and unlabored.

## 2012-06-09 NOTE — ED Notes (Signed)
Seen here for this pain epigastric area 3 days ago, states saw dr fields pa on the 2nd, now pain returns, sharp in nature with nausea.

## 2012-06-09 NOTE — ED Provider Notes (Signed)
History     CSN: [id number]  Arrival date & time 06/09/12  0047   First MD Initiated Contact with Patient 06/09/12 0102      Chief Complaint  Patient presents with  . Abdominal Pain    (Consider location/radiation/quality/duration/timing/severity/associated sxs/prior treatment) HPI  Penny [name] is a 26 y.o. female with a h/o gastric bypass, chronic pain syndrome, gastritis, interstitial cystitis who presents to the Emergency Department complaining of sharp cramping abdominal pain that began tonight and is similar to pain she has been experiencing for several weeks.She was seen yesterday by GI and they are adjusting her PPI and augmenting it with carafate. She denies fever, chills, vomiting, diarrhea.   PCP Dr. Gerda Diss GI Dr. Darrick Penna  Past Medical History  Diagnosis Date  . Migraines   . Interstitial cystitis   . IUD FEB 2010  . Gastritis JULY 2011  . Depression   . 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 Fatty liver   . Obesity (BMI 30-39.9) 2011 228 LBS BMI 36.8  . Iron deficiency anemia 07/23/2010  . Irritable bowel syndrome 2012 DIARRHEA    JUN 2012 TTG IgA 14.9  . Elevated liver enzymes JUL 2011ALK PHOS 111-127 AST  143-267 ALT  213-321T BILI 0.6  ALB  3.7-4.06 Jun 2011 ALK PHOS 118 AST 24 ALT 42 T BILI 0.4 ALB 3.9  . Chronic daily headache     Past Surgical History  Procedure Date  . Gab 2007    in [place]-POUCH 5 CM  . Cholecystectomy 2005    biliary dyskinesia  . Cath self every nite     for sodium bicarb injection (discontinued 2013)  . Colonoscopy JUN 2012 ABD PN/DIARRHEA WITH PROPOFOL    NL COLON  . Upper gastrointestinal endoscopy JULY 2011 NAUSEA-D125,V6, PH 25    Bx; GASTRITIS, POUCH-5 CM LONG  . Dilation and curettage of uterus   . Tonsillectomy     Family History  Problem Relation Age of Onset  . Hemochromatosis Maternal Grandmother   . Migraines Maternal Grandmother   . Cancer Maternal Grandmother   . Hypertension Father   . Diabetes  Father   . Coronary artery disease Father   . Migraines Paternal Grandmother   . Cancer Mother   . Coronary artery disease Paternal Grandfather     History  Substance Use Topics  . Smoking status: Current Everyday Smoker -- 0.5 packs/day for 5 years    Types: Cigarettes  . Smokeless tobacco: Never Used  . Alcohol Use: Yes     occasionally    OB History    Grav Para Term Preterm Abortions TAB SAB Ect Mult Living   2 1 1  1  1   1       Review of Systems  Constitutional: Negative for fever.       10 Systems reviewed and are negative for acute change except as noted in the HPI.  HENT: Negative for congestion.   Eyes: Negative for discharge and redness.  Respiratory: Negative for cough and shortness of breath.   Cardiovascular: Negative for chest pain.  Gastrointestinal: Positive for abdominal pain. Negative for vomiting.  Musculoskeletal: Negative for back pain.  Skin: Negative for rash.  Neurological: Negative for syncope, numbness and headaches.  Psychiatric/Behavioral:       No behavior change.    Allergies  Codeine; Gabapentin; Latex; Metoclopramide hcl; and Adhesive  Home Medications   Current Outpatient Rx  Name Route  Sig Dispense Refill  . CLOBETASOL PROPIONATE 0.05 % EX CREA Topical Apply topically 2 (two) times daily. For skin disorder.    Marland Kitchen DICYCLOMINE HCL 10 MG PO CAPS Oral Take 10 mg by mouth 4 (four) times daily -  before meals and at bedtime. For smooth muscle cramping.    Marland Kitchen ELETRIPTAN HYDROBROMIDE 40 MG PO TABS Oral One tablet by mouth as needed for migraine headache.  If the headache improves and then returns, dose may be repeated after 2 hours have elapsed since first dose (do not exceed 80 mg per day). may repeat in 2 hours if necessary     . FERUMOXYTOL INJECTION 510 MG/17 ML Intravenous Inject 510 mg into the vein every 6 (six) months. Iron Levels    . IBUPROFEN 200 MG PO TABS Oral Take 600 mg by mouth every 6 (six) hours as needed.    Marland Kitchen LEVONORGESTREL  20 MCG/24HR IU IUD Intrauterine 1 each by Intrauterine route once.      . OMEPRAZOLE 20 MG PO CPDR Oral Take 20 mg by mouth 2 (two) times daily. For acid reflux    . PANTOPRAZOLE SODIUM 20 MG PO TBEC  Take 1 po BID x 2 weeks then once a day 60 tablet 0  . PROMETHAZINE HCL 25 MG RE SUPP Rectal Place 1 suppository (25 mg total) rectally every 6 (six) hours as needed for nausea. 10 each 0  . PROMETHAZINE HCL 25 MG PO TABS Oral Take 12.5-25 mg by mouth every 6 (six) hours as needed. For nausea/vomiting    . QUETIAPINE FUMARATE 200 MG PO TABS Oral Take 1 tablet (200 mg total) by mouth at bedtime. For sleep and calm thoughts. 30 tablet 0    BP 145/95  Pulse 67  Temp 98 F (36.7 C) (Oral)  Resp 20  SpO2 100%  Physical Exam  Nursing note and vitals reviewed. Constitutional:       Awake, alert, nontoxic appearance.  HENT:  Head: Atraumatic.  Eyes: Right eye exhibits no discharge. Left eye exhibits no discharge.  Neck: Neck supple.  Cardiovascular: Normal rate and normal heart sounds.   Pulmonary/Chest: Effort normal and breath sounds normal. She exhibits no tenderness.  Abdominal: Soft. There is tenderness. There is no rebound.       Tenderness to epigastric area with palpation  Musculoskeletal: She exhibits no tenderness.       Baseline ROM, no obvious new focal weakness.  Neurological:       Mental status and motor strength appears baseline for patient and situation.  Skin: No rash noted.  Psychiatric: She has a normal mood and affect.    ED Course  Procedures (including critical care time)  Results for orders placed in visit on 06/06/12  HEPATIC FUNCTION PANEL      Component Value Range   Total Bilirubin 0.5  0.3 - 1.2 mg/dL   Bilirubin, Direct 0.1  0.0 - 0.3 mg/dL   Indirect Bilirubin 0.4  0.0 - 0.9 mg/dL   Alkaline Phosphatase 107  39 - 117 U/L   AST 56 (*) 0 - 37 U/L   ALT 144 (*) 0 - 35 U/L   Total Protein 6.5  6.0 - 8.3 g/dL   Albumin 4.4  3.5 - 5.2 g/dL  CBC       Component Value Range   WBC 5.5  4.0 - 10.5 K/uL   RBC 3.98  3.87 - 5.11 MIL/uL   Hemoglobin 12.6  12.0 - 15.0 g/dL  HCT 36.3  36.0 - 46.0 %   MCV 91.2  78.0 - 100.0 fL   MCH 31.7  26.0 - 34.0 pg   MCHC 34.7  30.0 - 36.0 g/dL   RDW 86.5  78.4 - 69.6 %   Platelets 200  150 - 400 K/uL  LIPASE      Component Value Range   Lipase 21  0 - 75 U/L  BASIC METABOLIC PANEL      Component Value Range   Sodium 139  135 - 145 mEq/L   Potassium 4.3  3.5 - 5.3 mEq/L   Chloride 105  96 - 112 mEq/L   CO2 26  19 - 32 mEq/L   Glucose, Bld 88  70 - 99 mg/dL   BUN 11  6 - 23 mg/dL   Creat 2.95  2.84 - 1.32 mg/dL   Calcium 9.3  8.4 - 44.0 mg/dL    Dg Abd Acute W/chest  06/09/2012  *RADIOLOGY REPORT*  Clinical Data: Abdominal pain  ACUTE ABDOMEN SERIES (ABDOMEN 2 VIEW & CHEST 1 VIEW)  Comparison: 12/17/2010 chest radiograph, 05/09/2011 abdominal CT  Findings: Lungs are clear.  Cardiomediastinal contours within normal range.  No free intraperitoneal air.  Surgical clips right upper quadrant. IUD projects over the pelvis. The bowel gas pattern is non- obstructive. Organ outlines are normal where seen. No acute or aggressive osseous abnormality identified.  IMPRESSION: Nonobstructive bowel gas pattern.  Original Report Authenticated By: Waneta Martins, M.D.        MDM  Patient with chronic abdominal pain here with sharp pain to the epigastric area.Given IVF, analgesics x 2, PPI, GI cocktail with improvement. Labs with persistent elevated LFTs, negative lipase. Acute abdominal series without acute findings.Patient to follow up with Dr. Darrick Penna, GI. Pt feels improved after observation and/or treatment in ED.Pt stable in ED with no significant deterioration in condition.The patient appears reasonably screened and/or stabilized for discharge and I doubt any other medical condition or other [facility] requiring further screening, evaluation, or treatment in the ED at this time prior to discharge.  MDM Reviewed:  nursing note, vitals and previous chart Reviewed previous: x-ray and CT scan Interpretation: labs and x-ray           Nicoletta Dress. Colon Branch, MD 06/10/12 2124

## 2012-06-09 NOTE — ED Notes (Addendum)
Pt reports severe epigastric pain.  Was seen previously with same complaints.  Reports medication not effective in reducing pain.  Pt did follow up with gastroenterologist and is awaiting results of lab work done.  Pt denies nausea or vomiting at this time.

## 2012-06-12 ENCOUNTER — Telehealth: Payer: Self-pay | Admitting: Gastroenterology

## 2012-06-12 ENCOUNTER — Ambulatory Visit (HOSPITAL_COMMUNITY)
Admission: RE | Admit: 2012-06-12 | Discharge: 2012-06-12 | Disposition: A | Discharge status: Home / Self Care | Payer: BC Managed Care – PPO | Source: Ambulatory Visit | Attending: Gastroenterology | Admitting: Gastroenterology

## 2012-06-12 DIAGNOSIS — R7989 Other specified abnormal findings of blood chemistry: Secondary | ICD-10-CM

## 2012-06-12 DIAGNOSIS — K769 Liver disease, unspecified: Secondary | ICD-10-CM | POA: Insufficient documentation

## 2012-06-12 DIAGNOSIS — R748 Abnormal levels of other serum enzymes: Secondary | ICD-10-CM | POA: Insufficient documentation

## 2012-06-12 DIAGNOSIS — R1011 Right upper quadrant pain: Secondary | ICD-10-CM | POA: Insufficient documentation

## 2012-06-12 DIAGNOSIS — R945 Abnormal results of liver function studies: Secondary | ICD-10-CM

## 2012-06-12 LAB — HEPATIC FUNCTION PANEL
ALT: 40 U/L — ABNORMAL HIGH (ref 0–35)
Albumin: 4.6 g/dL (ref 3.5–5.2)
Total Protein: 7.2 g/dL (ref 6.0–8.3)

## 2012-06-12 NOTE — Progress Notes (Signed)
Quick Note:  Discussed with Dr. Darrick Penna. LFTs not back yet.  She recommends EGD on 7/16 with propofol to further evaluate abd pain, weight loss. She recommends referral to [facility] for further evaluation of abnormal LFTS in setting of abd pain and prior gastric bypass. She may need her biliary tree looked at more closely but due to prior surgery it makes more difficult.  Limit ibuprofen use. Continue bentyl and protonix.  For bad pain, go to ED for pain management. ______

## 2012-06-12 NOTE — Progress Notes (Signed)
Quick Note:  See telephone note from 06/09/12. ______

## 2012-06-12 NOTE — Telephone Encounter (Signed)
I talked to the pt. Told her that Verlon Au has looked and reports are not back yet. We will call just as soon as we know and they are signed off on. Told her if her pain is that bad she should go on to the ED, but pt only says they will not do anything for her.

## 2012-06-12 NOTE — Telephone Encounter (Signed)
Faxed lab orders to [facility].

## 2012-06-12 NOTE — Progress Notes (Signed)
Per Tana Coast, PA , she could not order the Abdominal US STAT on Friday when she put the order in. Pt came by the office and was complaining of pain and Verlon Au said OK to call and let them know to call report to [name] right away. She said it would be OK for pt to go home since she lives in [place]. I called and informed Richard in the [name] dept.

## 2012-06-12 NOTE — Telephone Encounter (Signed)
Pt came back to office window saying she has had her ultrasound done and is in really bad pain. I told patient that the results of ultrasound and labs are not back yet. [name] is waiting to review. She can either go to ER if she is in that much pain or go home and wait for [name] to call to see what [name]/SF advises her to do. Patient said that the ER does nothing for her and that her mother has talked to [facility] about this. She asked if [name] or SF could call the ER. Patient continues to stand at the window and I told her this was [name] instructions (to go to the ER or wait at home for [name] to call with what she advises). DS came out to speak with patient.

## 2012-06-12 NOTE — Progress Notes (Signed)
Quick Note:  Called and informed pt. She did ask for pain medicine and I told her Dr. Darrick Penna said to go to the ED for pain management. Routing to Soledad Gerlach to schedule procedure and do referral. ______

## 2012-06-12 NOTE — Telephone Encounter (Signed)
Called pt and told her the US showed no acute findings per Tana Coast, PA. LFT's are not back. I told her again if she gets worse to go to the ED. She said that it was no need, they would not do anything for her.

## 2012-06-12 NOTE — Progress Notes (Signed)
Quick Note:  No acute findings on u/s. Awaiting LFTs. ______

## 2012-06-13 ENCOUNTER — Other Ambulatory Visit: Payer: Self-pay | Admitting: Gastroenterology

## 2012-06-13 ENCOUNTER — Telehealth: Payer: Self-pay | Admitting: Gastroenterology

## 2012-06-13 ENCOUNTER — Encounter (HOSPITAL_COMMUNITY): Payer: Self-pay | Admitting: Pharmacy Technician

## 2012-06-13 DIAGNOSIS — R1310 Dysphagia, unspecified: Secondary | ICD-10-CM

## 2012-06-13 DIAGNOSIS — R1013 Epigastric pain: Secondary | ICD-10-CM

## 2012-06-13 DIAGNOSIS — R1110 Vomiting, unspecified: Secondary | ICD-10-CM

## 2012-06-13 NOTE — Progress Notes (Signed)
Patient is scheduled for EGD w/SLF on 07/19 at 10:15 and instructions were mailed and patient is aware. But as far as the referral to [facility] she is asking that we refer her to [facility] to Dr. Clent Ridges that did her gastric bypass please advise?

## 2012-06-13 NOTE — Telephone Encounter (Signed)
Patient is scheduled for EGD w/SLF on 07/19 @ 10:15 instructions were mailed to patient and she is aware, but as far as the referral to [facility] she is asking to be referred to Dr. Clent Ridges Bariatric Dept at [facility] he is the on that did her gastric bypass please advise?

## 2012-06-13 NOTE — Telephone Encounter (Signed)
She needs to see a gastroenterologist at tertiary care center for biliary issues.

## 2012-06-13 NOTE — Telephone Encounter (Signed)
Quick Note:  Called and informed pt. She is aware Soledad Gerlach will be scheduling EGD and making the referral. ______

## 2012-06-13 NOTE — Progress Notes (Signed)
SPOKE WITH PT'S MOTHER. INFORMED HER OF THE PLAN. CONTINUE BENTYL. USE TYLENOL 650 MG Q6H PRN FOR PAIN. CHRONIC ABD PAIN MOST LIKELY DUE TO IBS. ETIOLOGY FOR ELEVATED LIVER ENZYMES IS UNCLEAR. NEEDS REFERRAL TO [facility]. EGD WITHIN THE NEXT WEEK. PT MY NEED EX LAP FOR SEVERE ABD PAIN

## 2012-06-13 NOTE — Progress Notes (Signed)
Quick Note:  Called Rx to [place] at [facility]. Pt is aware. ______

## 2012-06-13 NOTE — Telephone Encounter (Signed)
Quick Note:  Her LFTs almost normal now. Plan as per yesterday. Referral to [facility] as anatomy s/p gastric bypass difficult to evaluate locally. EGD as planned. ______

## 2012-06-13 NOTE — Telephone Encounter (Signed)
Issues addressed under ultrasound/lab reports.

## 2012-06-13 NOTE — Telephone Encounter (Signed)
Results Cc to PCP  

## 2012-06-13 NOTE — Telephone Encounter (Signed)
Referral was made to [facility] and they will contact the patient for date and time and patient is aware

## 2012-06-14 ENCOUNTER — Emergency Department (HOSPITAL_COMMUNITY): Payer: Medicaid Other

## 2012-06-14 ENCOUNTER — Emergency Department (HOSPITAL_COMMUNITY)
Admission: EM | Admit: 2012-06-14 | Discharge: 2012-06-14 | Disposition: A | Discharge status: Home / Self Care | Payer: Medicaid Other | Attending: Emergency Medicine | Admitting: Emergency Medicine

## 2012-06-14 ENCOUNTER — Encounter (HOSPITAL_COMMUNITY): Payer: Self-pay | Admitting: *Deleted

## 2012-06-14 ENCOUNTER — Telehealth (INDEPENDENT_AMBULATORY_CARE_PROVIDER_SITE_OTHER): Payer: Self-pay | Admitting: Internal Medicine

## 2012-06-14 ENCOUNTER — Telehealth: Payer: Self-pay | Admitting: Gastroenterology

## 2012-06-14 ENCOUNTER — Other Ambulatory Visit: Payer: Self-pay | Admitting: Gastroenterology

## 2012-06-14 DIAGNOSIS — G8929 Other chronic pain: Secondary | ICD-10-CM

## 2012-06-14 DIAGNOSIS — F329 Major depressive disorder, single episode, unspecified: Secondary | ICD-10-CM | POA: Insufficient documentation

## 2012-06-14 DIAGNOSIS — Z6830 Body mass index (BMI) 30.0-30.9, adult: Secondary | ICD-10-CM | POA: Insufficient documentation

## 2012-06-14 DIAGNOSIS — F17200 Nicotine dependence, unspecified, uncomplicated: Secondary | ICD-10-CM | POA: Insufficient documentation

## 2012-06-14 DIAGNOSIS — F3289 Other specified depressive episodes: Secondary | ICD-10-CM | POA: Insufficient documentation

## 2012-06-14 DIAGNOSIS — R1031 Right lower quadrant pain: Secondary | ICD-10-CM | POA: Insufficient documentation

## 2012-06-14 DIAGNOSIS — E669 Obesity, unspecified: Secondary | ICD-10-CM | POA: Insufficient documentation

## 2012-06-14 DIAGNOSIS — K589 Irritable bowel syndrome without diarrhea: Secondary | ICD-10-CM | POA: Insufficient documentation

## 2012-06-14 HISTORY — DX: Unspecified ovarian cyst, unspecified side: N83.209

## 2012-06-14 LAB — URINALYSIS, ROUTINE W REFLEX MICROSCOPIC
Bilirubin Urine: NEGATIVE
Glucose, UA: NEGATIVE mg/dL
Ketones, ur: NEGATIVE mg/dL
Nitrite: NEGATIVE
Specific Gravity, Urine: <1.005 — ABNORMAL LOW (ref 1.005–1.030)
pH: 6 (ref 5.0–8.0)

## 2012-06-14 LAB — URINE MICROSCOPIC-ADD ON

## 2012-06-14 LAB — CBC WITH DIFFERENTIAL/PLATELET
Basophils Relative: 0 % (ref 0–1)
Hemoglobin: 13.9 g/dL (ref 12.0–15.0)
Lymphs Abs: 3.4 10*3/uL (ref 0.7–4.0)
MCHC: 34.9 g/dL (ref 30.0–36.0)
Monocytes Relative: 8 % (ref 3–12)
Neutro Abs: 3 10*3/uL (ref 1.7–7.7)
Neutrophils Relative %: 42 % — ABNORMAL LOW (ref 43–77)
Platelets: 310 10*3/uL (ref 150–400)
RBC: 4.28 MIL/uL (ref 3.87–5.11)

## 2012-06-14 LAB — LIPASE, BLOOD: Lipase: 32 U/L (ref 11–59)

## 2012-06-14 LAB — COMPREHENSIVE METABOLIC PANEL
ALT: 40 U/L — ABNORMAL HIGH (ref 0–35)
Albumin: 4.2 g/dL (ref 3.5–5.2)
Alkaline Phosphatase: 96 U/L (ref 39–117)
BUN: 10 mg/dL (ref 6–23)
Chloride: 104 mEq/L (ref 96–112)
Potassium: 4.3 mEq/L (ref 3.5–5.1)
Sodium: 139 mEq/L (ref 135–145)
Total Bilirubin: 0.3 mg/dL (ref 0.3–1.2)
Total Protein: 7.4 g/dL (ref 6.0–8.3)

## 2012-06-14 MED ORDER — GI COCKTAIL ~~LOC~~
30.0000 mL | Freq: Once | ORAL | Status: DC
  Filled 2012-06-14: qty 30

## 2012-06-14 MED ORDER — HYDROMORPHONE HCL PF 1 MG/ML IJ SOLN
1.0000 mg | Freq: Once | INTRAMUSCULAR | Status: AC
  Administered 2012-06-14: 1 mg via INTRAVENOUS
  Filled 2012-06-14: qty 1

## 2012-06-14 MED ORDER — ONDANSETRON HCL 4 MG/2ML IJ SOLN
4.0000 mg | Freq: Once | INTRAMUSCULAR | Status: AC
  Administered 2012-06-14: 4 mg via INTRAVENOUS
  Filled 2012-06-14: qty 2

## 2012-06-14 MED ORDER — DROPERIDOL 2.5 MG/ML IJ SOLN
1.2500 mg | Freq: Once | INTRAMUSCULAR | Status: AC
  Administered 2012-06-14: 1.25 mg via INTRAVENOUS
  Filled 2012-06-14: qty 2

## 2012-06-14 MED ORDER — PANTOPRAZOLE SODIUM 40 MG IV SOLR
40.0000 mg | Freq: Once | INTRAVENOUS | Status: AC
  Administered 2012-06-14: 40 mg via INTRAVENOUS
  Filled 2012-06-14: qty 40

## 2012-06-14 MED ORDER — IOHEXOL 300 MG/ML  SOLN
100.0000 mL | Freq: Once | INTRAMUSCULAR | Status: AC | PRN
  Administered 2012-06-14: 100 mL via INTRAVENOUS

## 2012-06-14 MED ORDER — LORAZEPAM 2 MG/ML IJ SOLN
1.0000 mg | Freq: Once | INTRAMUSCULAR | Status: AC
  Administered 2012-06-14: 1 mg via INTRAVENOUS
  Filled 2012-06-14: qty 1

## 2012-06-14 MED ORDER — DIPHENHYDRAMINE HCL 50 MG/ML IJ SOLN
25.0000 mg | Freq: Once | INTRAMUSCULAR | Status: AC
  Administered 2012-06-14: 25 mg via INTRAVENOUS
  Filled 2012-06-14: qty 1

## 2012-06-14 MED ORDER — SODIUM CHLORIDE 0.9 % IV BOLUS (SEPSIS)
1000.0000 mL | Freq: Once | INTRAVENOUS | Status: AC
  Administered 2012-06-14: 1000 mL via INTRAVENOUS

## 2012-06-14 NOTE — ED Notes (Signed)
Patient stated that her pain had gotten severe and rated pain 10/10. Dr. Manus Gunning notified.

## 2012-06-14 NOTE — ED Notes (Signed)
Mother states that the patient has been having abdominal pain in the right lower quadrant associated with intermittent nausea, vomiting and diarrhea x1 month.  States she has been seen multiple times in the ER for the same complaint.

## 2012-06-14 NOTE — Telephone Encounter (Signed)
Forwarding to Dr.Fields.  

## 2012-06-14 NOTE — Telephone Encounter (Signed)
Patient called stating that she was having severe pain. She was seen at [facility] recently and is scheduled to have EGD on 06/26/2012 by Dr. Darrick Penna. Patient patient stated her pain came severe about an hour ago. She was in tears. Patient was advised to go to emergency room and Dr. Consuella Lose was informed the patient will be coming to emergency room for evaluation.

## 2012-06-14 NOTE — ED Notes (Signed)
abd pain for 1 month.  NVD. No fever.

## 2012-06-14 NOTE — ED Provider Notes (Signed)
History   This chart was scribed for Penny Octave, MD by Shari Heritage. The patient was seen in room APA12/APA12. Patient's care was started at 2117.     CSN: [id number]  Arrival date & time 06/14/12  2117   First MD Initiated Contact with Patient 06/14/12 2129      Chief Complaint  Patient presents with  . Abdominal Pain    (Consider location/radiation/quality/duration/timing/severity/associated sxs/prior treatment) Patient is a 26 y.o. female presenting with abdominal pain.  Abdominal Pain The primary symptoms of the illness include abdominal pain, nausea, vomiting and diarrhea. The current episode started more than 2 days ago.  The abdominal pain began more than 2 days ago. The abdominal pain is located in the RLQ and RUQ. The abdominal pain is relieved by nothing.  Vomiting occurs 2 to 5 times per day.  The diarrhea occurs more than 10 times per day.  Additional symptoms associated with the illness include back pain. Significant associated medical issues include inflammatory bowel disease and liver disease.   Penny [name] is a 26 y.o. female who presents to the Emergency Department complaining of constant, moderate to severe RUQ and RLQ abdominal pain onset 3 months ago that has worsened in the past several weeks. Patient says that 1 hour ago, patient began traveling from RLQ across abdomen to LLQ. Associated symptoms include nausea, vomiting and diarrhea. Patient says that she has had 2 episodes of emesis today. Over the past three days, patient has had up to 13 episodes of diarrhea. Patient is eating normally. Patient says that she is being followed by Dr. Darrick Penna, Dr. Verlon Au and Dr. Karilyn Cota. Patient has had elevated liver enzymes. Patient was also diagnosed with ovarian cysts. Patient denies dysuria and hematuria. Patient has been taking Tramadol at home for pain with little relief. Patient has a medical h/o IUD, migraines, Patient with surgical h/o of gastric bypass (2007) and  cholecystectomy.   Past Medical History  Diagnosis Date  . Migraines   . Interstitial cystitis   . IUD FEB 2010  . Gastritis JULY 2011  . Depression   . 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 Fatty liver   . Obesity (BMI 30-39.9) 2011 228 LBS BMI 36.8  . Iron deficiency anemia 07/23/2010  . Irritable bowel syndrome 2012 DIARRHEA    JUN 2012 TTG IgA 14.9  . Elevated liver enzymes JUL 2011ALK PHOS 111-127 AST  143-267 ALT  213-321T BILI 0.6  ALB  3.7-4.06 Jun 2011 ALK PHOS 118 AST 24 ALT 42 T BILI 0.4 ALB 3.9  . Chronic daily headache   . Ovarian cyst     Past Surgical History  Procedure Date  . Gab 2007    in [place]-POUCH 5 CM  . Cholecystectomy 2005    biliary dyskinesia  . Cath self every nite     for sodium bicarb injection (discontinued 2013)  . Colonoscopy JUN 2012 ABD PN/DIARRHEA WITH PROPOFOL    NL COLON  . Upper gastrointestinal endoscopy JULY 2011 NAUSEA-D125,V6, PH 25    Bx; GASTRITIS, POUCH-5 CM LONG  . Dilation and curettage of uterus   . Tonsillectomy   . Gastric bypass 06/2006    Family History  Problem Relation Age of Onset  . Hemochromatosis Maternal Grandmother   . Migraines Maternal Grandmother   . Cancer Maternal Grandmother   . Hypertension Father   . Diabetes Father   . Coronary artery disease Father   . Migraines  Paternal Grandmother   . Cancer Mother     breast  . Coronary artery disease Paternal Grandfather   . Hemochromatosis Mother     History  Substance Use Topics  . Smoking status: Current Everyday Smoker -- 0.5 packs/day for 5 years    Types: Cigarettes  . Smokeless tobacco: Never Used  . Alcohol Use: Yes     occasionally    OB History    Grav Para Term Preterm Abortions TAB SAB Ect Mult Living   2 1 1  1  1   1       Review of Systems  Gastrointestinal: Positive for nausea, vomiting, abdominal pain and diarrhea.  Musculoskeletal: Positive for back pain.  All other systems reviewed and are  negative.    Allergies  Codeine; Gabapentin; Metoclopramide hcl; Latex; and Tape  Home Medications   Current Outpatient Rx  Name Route Sig Dispense Refill  . CLOBETASOL PROPIONATE 0.05 % EX CREA Topical Apply topically 2 (two) times daily. For skin disorder.    Marland Kitchen DICYCLOMINE HCL 10 MG PO CAPS Oral Take 10 mg by mouth 3 (three) times daily as needed. For smooth muscle cramping.    Marland Kitchen ELETRIPTAN HYDROBROMIDE 40 MG PO TABS Oral One tablet by mouth at onset of headache. May repeat in 2 hours if headache persists or recurs. may repeat in 2 hours if necessary    . LEVONORGESTREL 20 MCG/24HR IU IUD Intrauterine 1 each by Intrauterine route once.      Marland Kitchen LORATADINE 10 MG PO TABS Oral Take 10 mg by mouth daily.    Marland Kitchen PANTOPRAZOLE SODIUM 20 MG PO TBEC Oral Take 20 mg by mouth 2 (two) times daily.    Marland Kitchen PROMETHAZINE HCL 25 MG PO TABS Oral Take 1 tablet (25 mg total) by mouth every 6 (six) hours as needed for nausea. 30 tablet 0    BP 138/87  Pulse 114  Temp 97.9 F (36.6 C) (Oral)  Resp 20  Ht 5\' 6"  (1.676 m)  Wt 205 lb (92.987 kg)  BMI 33.09 kg/m2  SpO2 96%  Physical Exam  Nursing note and vitals reviewed. Constitutional: She is oriented to person, place, and time. She appears well-developed and well-nourished.  HENT:  Head: Normocephalic and atraumatic.  Eyes: Conjunctivae and EOM are normal. Pupils are equal, round, and reactive to light.  Neck: Normal range of motion. Neck supple.  Cardiovascular: Normal rate and regular rhythm.   Pulmonary/Chest: Effort normal and breath sounds normal.  Abdominal: Soft. Bowel sounds are normal. There is tenderness in the right upper quadrant and right lower quadrant. There is no rebound and no guarding.  Musculoskeletal: Normal range of motion.  Neurological: She is alert and oriented to person, place, and time.  Skin: Skin is warm and dry.  Psychiatric: She has a normal mood and affect.    ED Course  Procedures (including critical care  time) DIAGNOSTIC STUDIES: Oxygen Saturation is 96% on room air, normal by my interpretation.    COORDINATION OF CARE: 9:36PM- Patient informed of current plan for treatment and evaluation and agrees with plan at this time. Will order CT of abdomen.  Results for orders placed during the hospital encounter of 06/14/12  CBC WITH DIFFERENTIAL      Component Value Range   WBC 7.3  4.0 - 10.5 K/uL   RBC 4.28  3.87 - 5.11 MIL/uL   Hemoglobin 13.9  12.0 - 15.0 g/dL   HCT 16.1  09.6 - 04.5 %  MCV 93.0  78.0 - 100.0 fL   MCH 32.5  26.0 - 34.0 pg   MCHC 34.9  30.0 - 36.0 g/dL   RDW 16.1  09.6 - 04.5 %   Platelets 310  150 - 400 K/uL   Neutrophils Relative 42 (*) 43 - 77 %   Neutro Abs 3.0  1.7 - 7.7 K/uL   Lymphocytes Relative 46  12 - 46 %   Lymphs Abs 3.4  0.7 - 4.0 K/uL   Monocytes Relative 8  3 - 12 %   Monocytes Absolute 0.6  0.1 - 1.0 K/uL   Eosinophils Relative 4  0 - 5 %   Eosinophils Absolute 0.3  0.0 - 0.7 K/uL   Basophils Relative 0  0 - 1 %   Basophils Absolute 0.0  0.0 - 0.1 K/uL  COMPREHENSIVE METABOLIC PANEL      Component Value Range   Sodium 139  135 - 145 mEq/L   Potassium 4.3  3.5 - 5.1 mEq/L   Chloride 104  96 - 112 mEq/L   CO2 26  19 - 32 mEq/L   Glucose, Bld 72  70 - 99 mg/dL   BUN 10  6 - 23 mg/dL   Creatinine, Ser 4.09  0.50 - 1.10 mg/dL   Calcium 81.1  8.4 - 91.4 mg/dL   Total Protein 7.4  6.0 - 8.3 g/dL   Albumin 4.2  3.5 - 5.2 g/dL   AST 35  0 - 37 U/L   ALT 40 (*) 0 - 35 U/L   Alkaline Phosphatase 96  39 - 117 U/L   Total Bilirubin 0.3  0.3 - 1.2 mg/dL   GFR calc non Af Amer >90  >90 mL/min   GFR calc Af Amer >90  >90 mL/min  LIPASE, BLOOD      Component Value Range   Lipase 32  11 - 59 U/L  URINALYSIS, ROUTINE W REFLEX MICROSCOPIC      Component Value Range   Color, Urine STRAW (*) YELLOW   APPearance CLEAR  CLEAR   Specific Gravity, Urine <1.005 (*) 1.005 - 1.030   pH 6.0  5.0 - 8.0   Glucose, UA NEGATIVE  NEGATIVE mg/dL   Hgb urine  dipstick SMALL (*) NEGATIVE   Bilirubin Urine NEGATIVE  NEGATIVE   Ketones, ur NEGATIVE  NEGATIVE mg/dL   Protein, ur NEGATIVE  NEGATIVE mg/dL   Urobilinogen, UA 0.2  0.0 - 1.0 mg/dL   Nitrite NEGATIVE  NEGATIVE   Leukocytes, UA NEGATIVE  NEGATIVE  PREGNANCY, URINE      Component Value Range   Preg Test, Ur NEGATIVE  NEGATIVE  URINE MICROSCOPIC-ADD ON      Component Value Range   Squamous Epithelial / LPF RARE  RARE   WBC, UA 0-2  <3 WBC/hpf   RBC / HPF 0-2  <3 RBC/hpf   Bacteria, UA RARE  RARE   Ct Abdomen Pelvis W Contrast  06/14/2012  *RADIOLOGY REPORT*  Clinical Data: Right lower quadrant pain  CT ABDOMEN AND PELVIS WITH CONTRAST  Technique:  Multidetector CT imaging of the abdomen and pelvis was performed following the standard protocol during bolus administration of intravenous contrast.  Contrast: OMNIPAQUE IOHEXOL 300 MG/ML  SOLN  Comparison: 05/09/2011  Findings: Post cholecystectomy.  Ill-defined 2.1 x 1.1 cm irregular lesion in the central liver on image 18. It is not significantly changed compared with prior imaging.  Postoperative changes from gastric bypass.  Spleen, kidneys, adrenal glands are  within normal limits. Prominence of the common bile duct is too to post cholecystectomy. Allowing for this, the pancreas is within normal limits.  Normal appendix.  Unremarkable bladder.  IUD is present within the uterus.  No destructive bone lesion.  No free fluid.  IMPRESSION: No acute intra-abdominal pathology.  Original Report Authenticated By: Donavan Burnet, M.D.     No diagnosis found.    MDM  Acute on chronic abdominal pain, has had for 3 months, worse in the past 1 month.  Associated with nausea and vomiting. She became concerned tonight when the pain moved more to her left side than usual. She called Dr. Karilyn Cota and was told to come to ED.  Seeing GI, has been referred to [facility] where she has not gone yet.  EGD scheduled for 7/23. Hemorrhagic cyst on TVUS last  month.  CT scan negative. Labs unremarkable. Abdomen soft and nonsurgical. Family (not patient) wondering about admission tonight to "move up the endoscopy". I explained this is not an indication for admission. her pain is controlled she is tolerating by mouth. This is a chronic problem and she is followed by a specialist already. Her lower abdominal pain is also unlikely to be explained by upper endoscopy. Stable for outpatient followup with Dr. Darrick Penna.  Return precautions discussed.  I personally performed the services described in this documentation, which was scribed in my presence.  The recorded information has been reviewed and considered.   Penny Octave, MD 06/14/12 [phone]

## 2012-06-14 NOTE — Telephone Encounter (Signed)
Patient is aware that her procedure has been moved to Tuesday July 23rd in the OR and she is asking why hasn't she had a CT of her Abdomin to check appendix because that's the area her pain is coming from please advise?

## 2012-06-15 ENCOUNTER — Telehealth: Payer: Self-pay | Admitting: Gastroenterology

## 2012-06-15 NOTE — Telephone Encounter (Signed)
Patients EGD in the OR has been R/S to 07/16 Tuesday and she is aware

## 2012-06-15 NOTE — Patient Instructions (Addendum)
Your procedure is scheduled on:  06/20/2012  Report to [facility] at    9:30  AM.  Call this number if you have problems the morning of surgery: [phone]   Remember:   Do not drink or eat food:After Midnight.    Clear liquids include soda, tea, black coffee, apple or grape juice, broth.  Take these medicines the morning of surgery with A SIP OF WATER: Protonix   Do not wear jewelry, make-up or nail polish.  Do not wear lotions, powders, or perfumes. You may wear deodorant.  Do not shave 48 hours prior to surgery.  Do not bring valuables to the hospital.  Contacts, dentures or bridgework may not be worn into surgery.  Leave suitcase in the car. After surgery it may be brought to your room.  For patients admitted to the hospital, checkout time is 11:00 AM the day of discharge.   Patients discharged the day of surgery will not be allowed to drive home.  Name and phone number of your driver:     Please read over the following fact sheets that you were given: Pain Booklet, MRSA  Surgical Site Infection Prevention, Anesthesia Post-op Instructions and Care and Recovery After Surgery    Endoscopy Care After Please read the instructions outlined below and refer to this sheet in the next few weeks. These discharge instructions provide you with general information on caring for yourself after you leave the hospital. Your doctor may also give you specific instructions. While your treatment has been planned according to the most current medical practices available, unavoidable complications occasionally occur. If you have any problems or questions after discharge, please call your doctor. HOME CARE INSTRUCTIONS Activity  You may resume your regular activity but move at a slower pace for the next 24 hours.   Take frequent rest periods for the next 24 hours.   Walking will help expel (get rid of) the air and reduce the bloated feeling in your abdomen.   No driving for 24 hours (because of the  anesthesia (medicine) used during the test).   You may shower.   Do not sign any important legal documents or operate any machinery for 24 hours (because of the anesthesia used during the test).  Nutrition  Drink plenty of fluids.   You may resume your normal diet.   Begin with a light meal and progress to your normal diet.   Avoid alcoholic beverages for 24 hours or as instructed by your caregiver.  Medications You may resume your normal medications unless your caregiver tells you otherwise. What you can expect today  You may experience abdominal discomfort such as a feeling of fullness or "gas" pains.   You may experience a sore throat for 2 to 3 days. This is normal. Gargling with salt water may help this.  Follow-up Your doctor will discuss the results of your test with you. SEEK IMMEDIATE MEDICAL CARE IF:  You have excessive nausea (feeling sick to your stomach) and/or vomiting.   You have severe abdominal pain and distention (swelling).   You have trouble swallowing.   You have a temperature over 100 F (37.8 C).   You have rectal bleeding or vomiting of blood.  Document Released: 07/06/2004 Document Revised: 11/11/2011 Document Reviewed: 01/17/2008 [facility] Patient Information 2012 [place], [place].

## 2012-06-15 NOTE — Telephone Encounter (Signed)
REVIEWED.  

## 2012-06-15 NOTE — Telephone Encounter (Signed)
Pt seen in ED 7/10 HFP Improved, NL CBC, UA, & HCG. CT A/P NAIAP. EGD 7/23. Referral to [facility].

## 2012-06-15 NOTE — Telephone Encounter (Signed)
Informed pt .

## 2012-06-15 NOTE — Telephone Encounter (Signed)
Referral to [facility] has been made and they will contact the patient with date and time and patient is aware

## 2012-06-16 ENCOUNTER — Encounter (HOSPITAL_COMMUNITY)
Admission: RE | Admit: 2012-06-16 | Discharge: 2012-06-16 | Payer: Medicaid Other | Source: Ambulatory Visit | Admitting: Gastroenterology

## 2012-06-19 ENCOUNTER — Ambulatory Visit (HOSPITAL_COMMUNITY)
Admission: RE | Admit: 2012-06-19 | Discharge: 2012-06-19 | Disposition: A | Discharge status: Home / Self Care | Payer: BC Managed Care – PPO | Source: Ambulatory Visit | Attending: Gastroenterology | Admitting: Gastroenterology

## 2012-06-19 ENCOUNTER — Encounter (HOSPITAL_COMMUNITY): Payer: Self-pay

## 2012-06-19 HISTORY — DX: Nausea with vomiting, unspecified: R11.2

## 2012-06-19 HISTORY — DX: Other specified postprocedural states: R11.2

## 2012-06-19 HISTORY — DX: Other specified postprocedural states: Z98.890

## 2012-06-19 IMAGING — US US ABDOMEN COMPLETE
1 series · 13 of 25 positions shown · non-contrast
Comparison: CT 06/02/2006.  Ultrasound 01/15/2005.

CLINICAL DATA: History of elevated transaminase level.  History of
cholecystectomy.  History of fatty infiltration of the liver.

ABDOMINAL ULTRASOUND COMPLETE

[Series 1: us abdomen complete · 0.30mm/px · 13 of 107 slices shown]
[im 1/107]
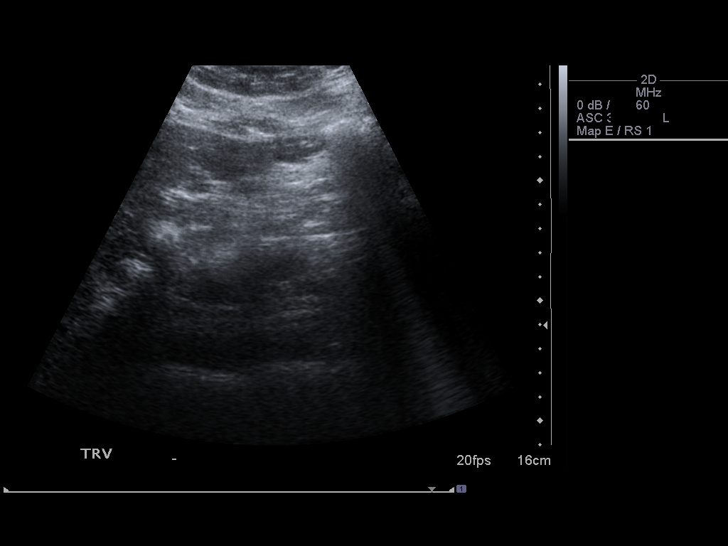
[im 9/107]
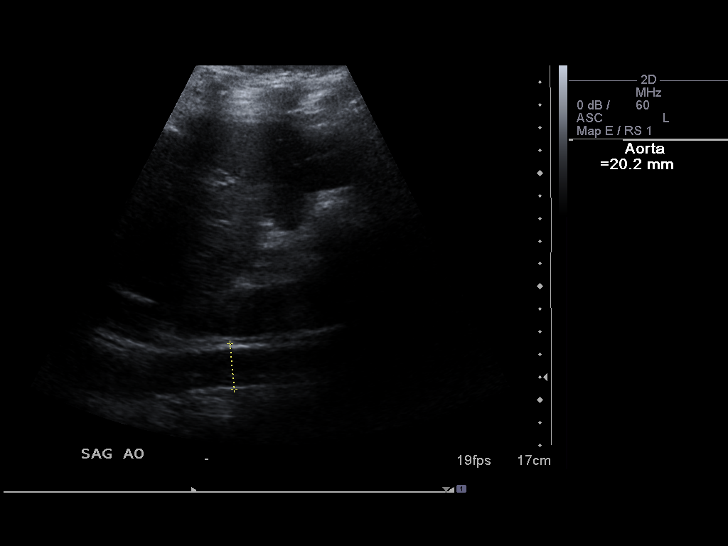
[im 18/107]
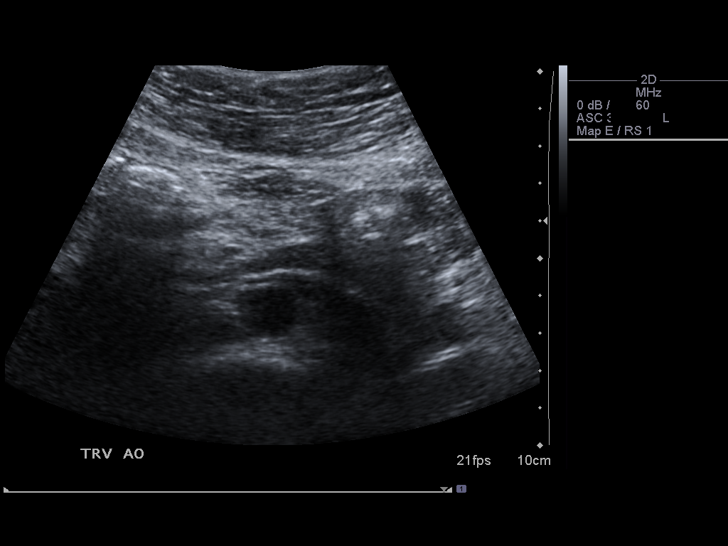
[im 27/107]
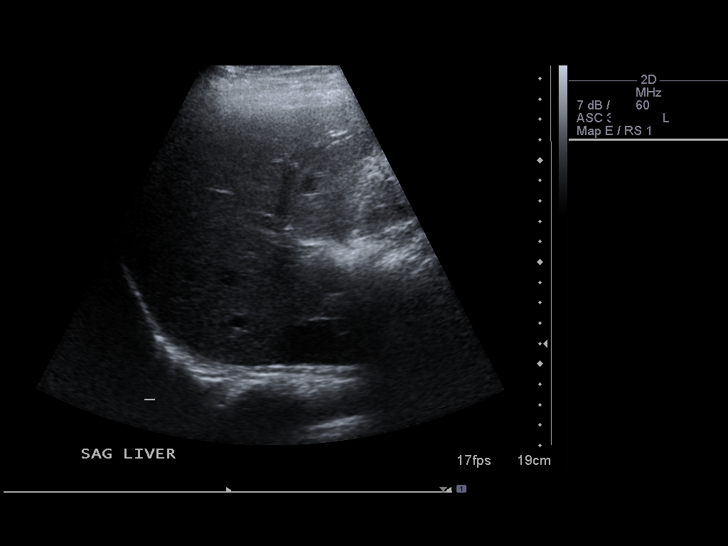
[im 36/107]
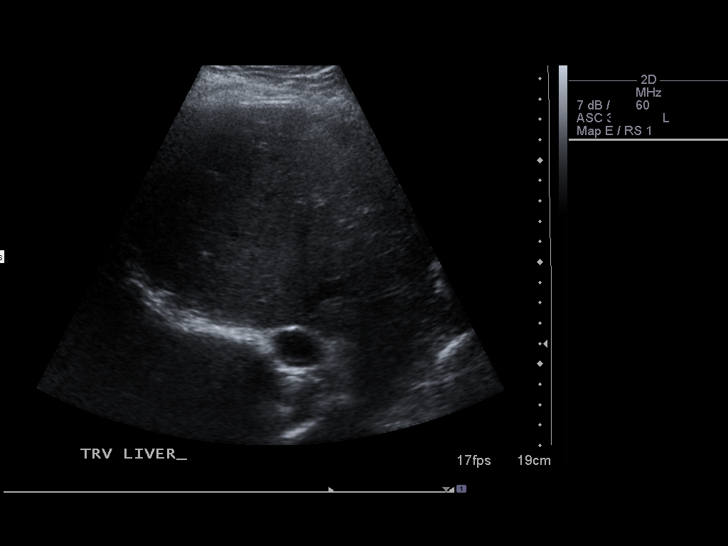
[im 45/107]
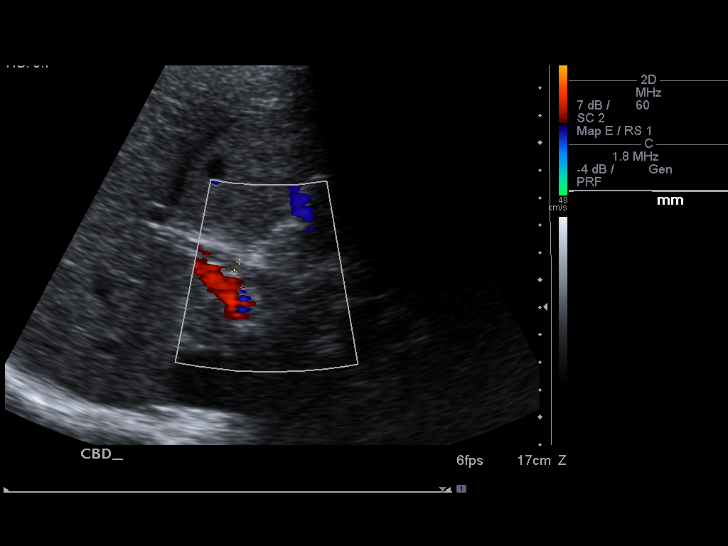
[im 54/107]
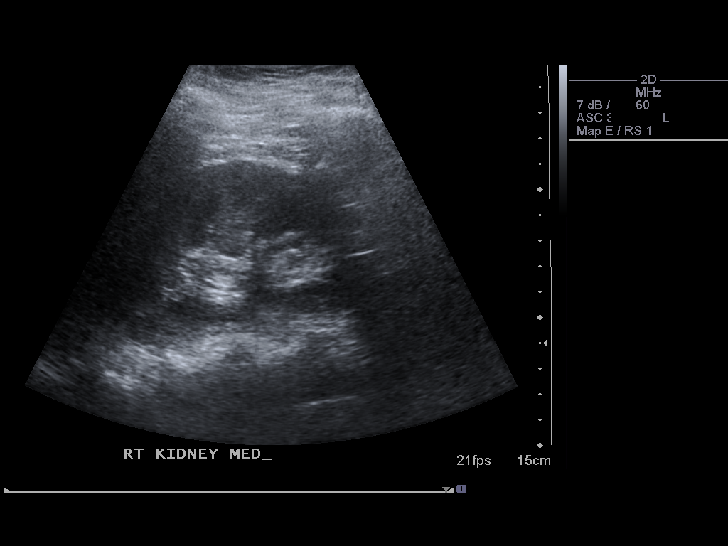
[im 62/107]
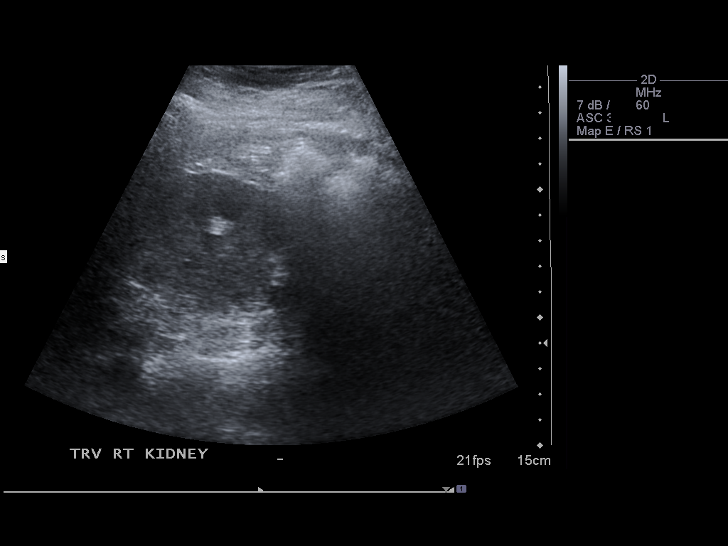
[im 71/107]
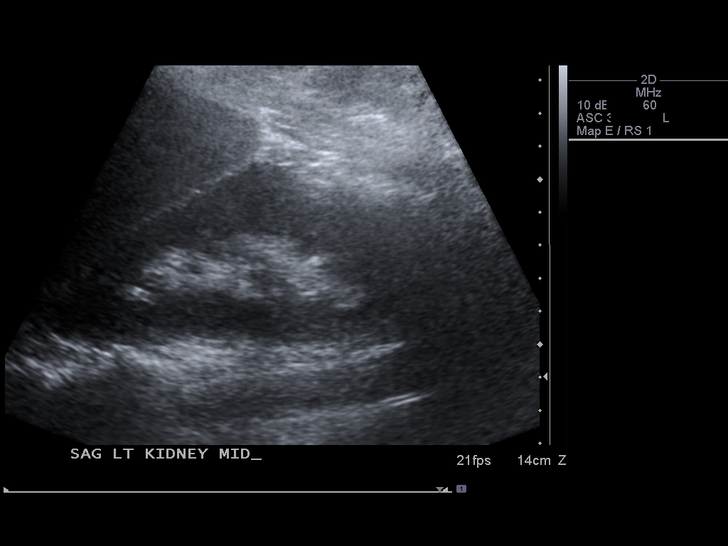
[im 80/107]
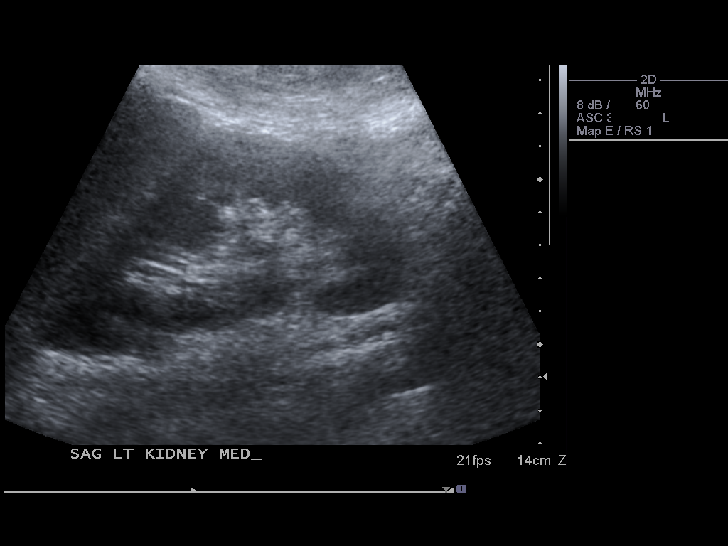
[im 89/107]
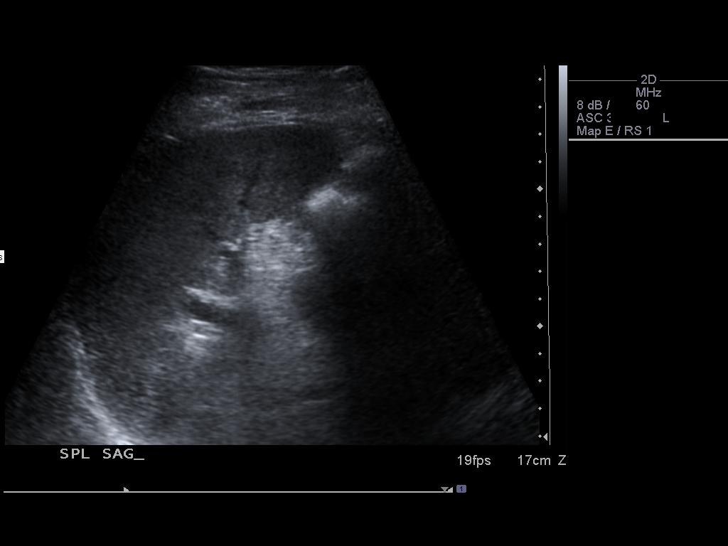
[im 98/107]
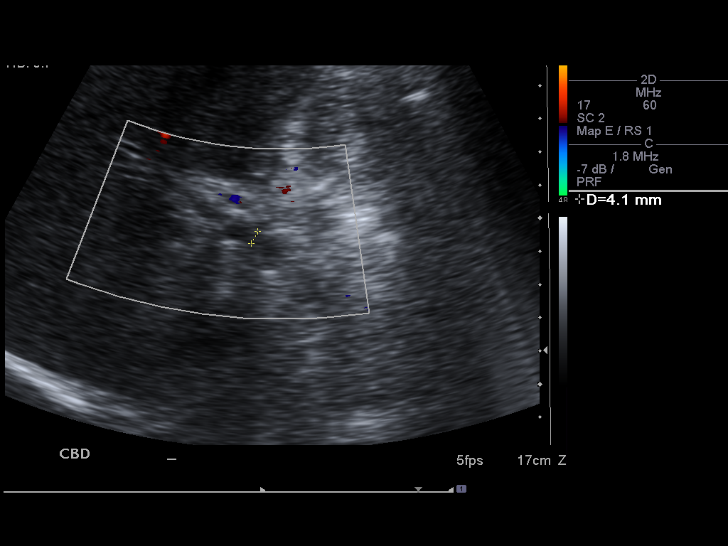
[im 107/107]
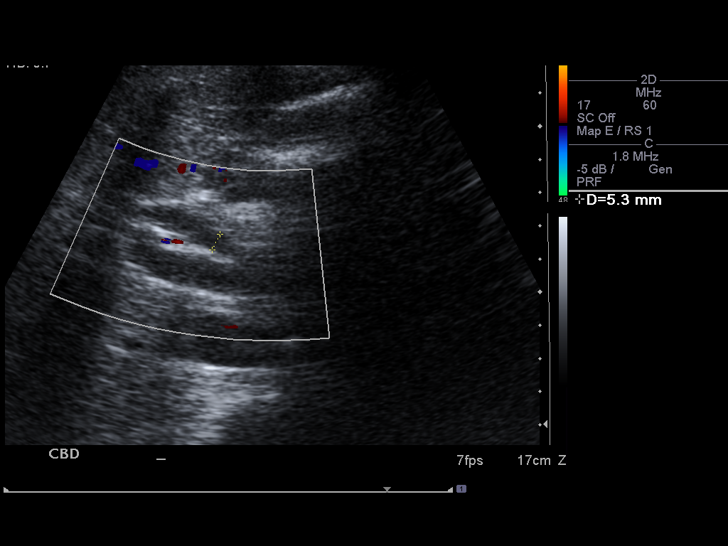

[13 of 25 positions shown; findings below may reference images not displayed]

FINDINGS: Gallbladder: Previous cholecystectomy has been performed by
history.  No gallbladder was evident.

CBD: Normal in caliber status post cholecystectomy measuring
mm. No choledocholithiasis is evident.

Liver:  Normal hepatic size with increased density and increased
echogenicity of parenchymal echotexture without focal parenchymal
abnormality. Portal vein appears patent.  Portal venous flow was
hepatopetal.  Hepatic veins appear patent.

IVC:  Patent throughout its visualized course in the abdomen.

Pancreas:  The area of the pancreas is obscured by bowel gas.  The
pancreas cannot be evaluated.

Spleen:  Normal size and echotexture without focal abnormality.
Length is 10.2 cm.

Right kidney:  No hydronephrosis.  Well-preserved cortex.  Normal
parenchymal echotexture .  Right renal length is 10.7 cm. There is
a focal hyperechoic area in the midportion of the right kidney near
the collecting system.  This area measures 9.4 x 6.3 x 11.9 mm. No
internal color Doppler flow was seen.

Left kidney:  No hydronephrosis.  Well-preserved cortex.  Normal
parenchymal echotexture without focal abnormalities.  Left renal
length is 11.8 cm.

Aorta:  Maximum diameter is 2.0 cm.  No aneurysm is evident.

Ascites:  None.
IMPRESSION: No acute abdominal pathology was demonstrated.  Previous
cholecystectomy has been performed.

Normal hepatic size with increased density and increased
echogenicity of parenchymal echotexture without focal parenchymal
abnormality. Portal vein appears patent.  Portal venous flow was
hepatopetal.  Hepatic veins appear patent. Increased density in
echogenicity most commonly associated with fatty infiltration.
Fatty infiltration of the liver was demonstrated on the previous CT
examination.

There is a focal hyperechoic area in the midportion of the right
kidney near the collecting system.  This area measures 9.4 x 6.3 x
11.9 mm. No internal color Doppler flow was seen. This could
reflect a portion of the central pelvic lipomatosis but I am
concerned this could possibly reflect mass.  It is hyperechoic
which would favor angiomyolipoma if a mass is present.  I cannot
visualize this on previous ultrasound examination or CT. Definitive
evaluation could be obtained by obtaining MRI .  Alternatively a
follow-up study could be obtained to exclude enlarging lesion.

## 2012-06-20 ENCOUNTER — Encounter (HOSPITAL_COMMUNITY)
Admission: RE | Disposition: A | Discharge status: Home / Self Care | Payer: Self-pay | Source: Ambulatory Visit | Attending: Gastroenterology

## 2012-06-20 ENCOUNTER — Ambulatory Visit (HOSPITAL_COMMUNITY)
Admission: RE | Admit: 2012-06-20 | Discharge: 2012-06-20 | Disposition: A | Discharge status: Home / Self Care | Payer: Medicaid Other | Source: Ambulatory Visit | Attending: Gastroenterology | Admitting: Gastroenterology

## 2012-06-20 ENCOUNTER — Encounter (HOSPITAL_COMMUNITY): Payer: Self-pay | Admitting: Anesthesiology

## 2012-06-20 ENCOUNTER — Encounter (HOSPITAL_COMMUNITY): Payer: Self-pay | Admitting: *Deleted

## 2012-06-20 ENCOUNTER — Ambulatory Visit (HOSPITAL_COMMUNITY): Payer: Medicaid Other | Admitting: Anesthesiology

## 2012-06-20 DIAGNOSIS — K2970 Gastritis, unspecified, without bleeding: Secondary | ICD-10-CM

## 2012-06-20 DIAGNOSIS — K2940 Chronic atrophic gastritis without bleeding: Secondary | ICD-10-CM | POA: Insufficient documentation

## 2012-06-20 DIAGNOSIS — K2990 Gastroduodenitis, unspecified, without bleeding: Secondary | ICD-10-CM

## 2012-06-20 DIAGNOSIS — Z9884 Bariatric surgery status: Secondary | ICD-10-CM | POA: Insufficient documentation

## 2012-06-20 DIAGNOSIS — R109 Unspecified abdominal pain: Secondary | ICD-10-CM | POA: Insufficient documentation

## 2012-06-20 DIAGNOSIS — K283 Acute gastrojejunal ulcer without hemorrhage or perforation: Secondary | ICD-10-CM | POA: Insufficient documentation

## 2012-06-20 DIAGNOSIS — K2980 Duodenitis without bleeding: Secondary | ICD-10-CM | POA: Insufficient documentation

## 2012-06-20 DIAGNOSIS — R1310 Dysphagia, unspecified: Secondary | ICD-10-CM

## 2012-06-20 DIAGNOSIS — K289 Gastrojejunal ulcer, unspecified as acute or chronic, without hemorrhage or perforation: Secondary | ICD-10-CM

## 2012-06-20 HISTORY — PX: SAVORY DILATION: SHX5439

## 2012-06-20 SURGERY — ESOPHAGOGASTRODUODENOSCOPY (EGD) WITH PROPOFOL
Anesthesia: Monitor Anesthesia Care

## 2012-06-20 MED ORDER — STERILE WATER FOR IRRIGATION IR SOLN
Status: DC | PRN
  Administered 2012-06-20: 12:00:00

## 2012-06-20 MED ORDER — ONDANSETRON HCL 4 MG/2ML IJ SOLN
4.0000 mg | Freq: Once | INTRAMUSCULAR | Status: AC
  Administered 2012-06-20: 4 mg via INTRAVENOUS

## 2012-06-20 MED ORDER — PROPOFOL 10 MG/ML IV BOLUS
INTRAVENOUS | Status: DC | PRN
  Administered 2012-06-20: 20 mg via INTRAVENOUS

## 2012-06-20 MED ORDER — ONDANSETRON HCL 4 MG/2ML IJ SOLN
INTRAMUSCULAR | Status: AC
  Administered 2012-06-20: 4 mg via INTRAVENOUS
  Filled 2012-06-20: qty 2

## 2012-06-20 MED ORDER — MIDAZOLAM HCL 2 MG/2ML IJ SOLN
INTRAMUSCULAR | Status: AC
  Filled 2012-06-20: qty 2

## 2012-06-20 MED ORDER — LACTATED RINGERS IV SOLN
INTRAVENOUS | Status: DC
  Administered 2012-06-20: 1000 mL via INTRAVENOUS

## 2012-06-20 MED ORDER — MINERAL OIL PO OIL
TOPICAL_OIL | ORAL | Status: AC
  Filled 2012-06-20: qty 30

## 2012-06-20 MED ORDER — MIDAZOLAM HCL 2 MG/2ML IJ SOLN
1.0000 mg | INTRAMUSCULAR | Status: AC | PRN
  Administered 2012-06-20 (×3): 2 mg via INTRAVENOUS

## 2012-06-20 MED ORDER — DEXAMETHASONE SODIUM PHOSPHATE 4 MG/ML IJ SOLN
4.0000 mg | Freq: Once | INTRAMUSCULAR | Status: AC
  Administered 2012-06-20: 4 mg via INTRAVENOUS

## 2012-06-20 MED ORDER — FENTANYL CITRATE 0.05 MG/ML IJ SOLN: 25.0000 ug | INTRAMUSCULAR | Status: DC | PRN

## 2012-06-20 MED ORDER — BUTAMBEN-TETRACAINE-BENZOCAINE 2-2-14 % EX AERO
1.0000 | INHALATION_SPRAY | Freq: Once | CUTANEOUS | Status: AC
  Administered 2012-06-20: 1 via TOPICAL
  Filled 2012-06-20: qty 56

## 2012-06-20 MED ORDER — LIDOCAINE HCL (PF) 1 % IJ SOLN
INTRAMUSCULAR | Status: AC
  Filled 2012-06-20: qty 5

## 2012-06-20 MED ORDER — PROPOFOL 10 MG/ML IV EMUL
INTRAVENOUS | Status: DC | PRN
  Administered 2012-06-20: 12:00:00 via INTRAVENOUS
  Administered 2012-06-20: 100 ug/kg/min via INTRAVENOUS

## 2012-06-20 MED ORDER — DEXAMETHASONE SODIUM PHOSPHATE 4 MG/ML IJ SOLN
INTRAMUSCULAR | Status: AC
  Administered 2012-06-20: 4 mg via INTRAVENOUS
  Filled 2012-06-20: qty 1

## 2012-06-20 MED ORDER — ONDANSETRON HCL 4 MG/2ML IJ SOLN
4.0000 mg | Freq: Once | INTRAMUSCULAR | Status: AC | PRN
  Administered 2012-06-20: 4 mg via INTRAVENOUS

## 2012-06-20 MED ORDER — MIDAZOLAM HCL 2 MG/2ML IJ SOLN
INTRAMUSCULAR | Status: AC
  Administered 2012-06-20: 2 mg via INTRAVENOUS
  Filled 2012-06-20: qty 2

## 2012-06-20 MED ORDER — FENTANYL CITRATE 0.05 MG/ML IJ SOLN
INTRAMUSCULAR | Status: AC
  Filled 2012-06-20: qty 2

## 2012-06-20 MED ORDER — MIDAZOLAM HCL 5 MG/5ML IJ SOLN
INTRAMUSCULAR | Status: DC | PRN
  Administered 2012-06-20: 2 mg via INTRAVENOUS

## 2012-06-20 MED ORDER — PROPOFOL 10 MG/ML IV EMUL
INTRAVENOUS | Status: AC
  Filled 2012-06-20: qty 20

## 2012-06-20 MED ORDER — FENTANYL CITRATE 0.05 MG/ML IJ SOLN
INTRAMUSCULAR | Status: DC | PRN
  Administered 2012-06-20 (×2): 50 ug via INTRAVENOUS

## 2012-06-20 SURGICAL SUPPLY — 17 items
BLOCK BITE 60FR ADLT L/F BLUE (MISCELLANEOUS) ×1 IMPLANT
ELECT REM PT RETURN 9FT ADLT (ELECTROSURGICAL)
ELECTRODE REM PT RTRN 9FT ADLT (ELECTROSURGICAL) IMPLANT
FLOOR PAD 36X40 (MISCELLANEOUS) ×2
FORCEPS BIOP RAD 4 LRG CAP 4 (CUTTING FORCEPS) IMPLANT
MANIFOLD NEPTUNE II (INSTRUMENTS) ×2 IMPLANT
NDL SCLEROTHERAPY 25GX240 (NEEDLE) IMPLANT
NEEDLE SCLEROTHERAPY 25GX240 (NEEDLE) IMPLANT
PAD FLOOR 36X40 (MISCELLANEOUS) IMPLANT
PROBE APC STR FIRE (PROBE) IMPLANT
PROBE INJECTION GOLD (MISCELLANEOUS)
PROBE INJECTION GOLD 7FR (MISCELLANEOUS) IMPLANT
SNARE SHORT THROW 13M SML OVAL (MISCELLANEOUS) IMPLANT
SYR 50ML LL SCALE MARK (SYRINGE) ×1 IMPLANT
TUBING ENDO SMARTCAP PENTAX (MISCELLANEOUS) ×1 IMPLANT
TUBING IRRIGATION ENDOGATOR (MISCELLANEOUS) ×1 IMPLANT
WATER STERILE IRR 1000ML POUR (IV SOLUTION) ×1 IMPLANT

## 2012-06-20 NOTE — Transfer of Care (Signed)
Immediate Anesthesia Transfer of Care Note  [name]  Procedure(s) Performed: Procedure(s) (LRB): ESOPHAGOGASTRODUODENOSCOPY (EGD) WITH PROPOFOL (N/A) SAVORY DILATION (N/A)  Patient Location: PACU  Anesthesia Type: MAC  Level of Consciousness: awake, alert  and oriented  Airway & Oxygen Therapy: Patient Spontanous Breathing and Patient connected to face mask oxygen  Post-op Assessment: Report given to PACU RN  Post vital signs: Reviewed and stable  Complications: No apparent anesthesia complications

## 2012-06-20 NOTE — Anesthesia Preprocedure Evaluation (Signed)
Anesthesia Evaluation  Patient identified by MRN, date of birth, ID band Patient awake    Reviewed: Allergy & Precautions, H&P , NPO status , Patient's Chart, lab work & pertinent test results  History of Anesthesia Complications (+) PONV  Airway Mallampati: I      Dental  (+) Teeth Intact   Pulmonary Current Smoker,  breath sounds clear to auscultation        Cardiovascular negative cardio ROS  Rhythm:Regular Rate:Normal     Neuro/Psych  Headaches, PSYCHIATRIC DISORDERS Depression    GI/Hepatic Gastric Bypass    Endo/Other  Morbid obesity  Renal/GU      Musculoskeletal   Abdominal   Peds  Hematology   Anesthesia Other Findings   Reproductive/Obstetrics                           Anesthesia Physical Anesthesia Plan  ASA: II  Anesthesia Plan: MAC   Post-op Pain Management:    Induction: Intravenous  Airway Management Planned: Simple Face Mask  Additional Equipment:   Intra-op Plan:   Post-operative Plan:   Informed Consent: I have reviewed the patients History and Physical, chart, labs and discussed the procedure including the risks, benefits and alternatives for the proposed anesthesia with the patient or authorized representative who has indicated his/her understanding and acceptance.     Plan Discussed with:   Anesthesia Plan Comments:         Anesthesia Quick Evaluation

## 2012-06-20 NOTE — Op Note (Signed)
[facility] [address] [place], [place]  [address]  ENDOSCOPY PROCEDURE REPORT  PATIENT:  Penny [name], Penny [name]  MR#:  [id number] BIRTHDATE:  [birth year], 25 yrs. old  GENDER:  female  ENDOSCOPIST:  Jonette Eva, MD ASSISTANT: Referred by:  [name], M.D.  PROCEDURE DATE:  06/20/2012 PROCEDURE:  EGD with biopsy, EGD with dilatation over guidewire ASA CLASS: INDICATIONS:  ABD PAIN-PT C/O 10/10 BUT APPEARS IN NO ACUTE DISTRESS SOLID DYSPHAGIA  PER HUSBAND-PT DOES NTO FOLLOW GASTRECTMY DIET AND USES IBUPROFEN, TYLENOL, AND/O ULTRAM FOR PAIN. HAS HX NARCOTIC ADDICTION AND WENT TO REHAB/DETOX IN 2013.  JUL 2013: ALT 144-->40 AST 56-->35, CT A/P W/ IVC NAIAP, NL ALB, ALK PHOS, AND BILIRUBIN  MEDICATIONS:   MAC sedation, administered by CRNA TOPICAL ANESTHETIC:  Cetacaine Spray  DESCRIPTION OF PROCEDURE:   After the risks benefits and alternatives of the procedure were thoroughly explained, informed consent was obtained.  The  endoscope was introduced through the mouth and advanced to the second portion of the duodenum.  The instrument was slowly withdrawn as the mucosa was carefully examined.  Prior to withdrawal of the scope, the guidwire was placed.  The esophagus was dilated successfully.  The patient was recovered in endoscopy and discharged home in satisfactory condition. <<PROCEDUREIMAGES>> Mild gastritis was found & BIOPSIED VIA COLD FORCEPS.  MULTIPLE SUPERFICIAL ulcers WERE found in the EFFERENT LIMB(?mid jejunum)  BIOPSIED VIA COLD FORCEPS.    NO DEFINITE STRICTURE SEEN IN THE ESOPHAGUS. Dilation was then performed at the total esophag [name] DUE TO COMPLAINT OF SOLID DYSPHAGIA.  1) Dilator:  Savary over guidewire  Size(s):  16 MM Resistance:  minimal  Heme:  none Appearance:  COMPLICATIONS:  None  ENDOSCOPIC IMPRESSION: 1) Mild gastritis 2) Ulcer in the mid jejunum NO OBVIOUS SOURCE FOR 10/10 ABD PAIN IDENTIFIED RECOMMENDATIONS: 1) await biopsy  results CONTINUE PROTONIX BID FOLLOW A GASTRECTOMY DIET. FOLLOW UP [facility] SPECIALIST. REFER TO DR. Clent Ridges. I PERSONALLY REVIEWED TEH JUL 2013 T WITH DR. Tyron Russell. FOLLOW UP FOR A 2ND OPINION AT [facility] REGARDING PAIN AND TRANSIENTLY ELEVATED LIVER ENZYMES OPV W/ DR. Kush Farabee IN 3 MOS. DISCUSSED PLAN WITH HUSBAND, PT, AND PT'S FATHER.  REPEAT EXAM:  No  ______________________________ Jonette Eva, MD  CC:  n. eSIGNED:   Catlyn Shipton at 06/20/2012 03:48 PM  Mickeal Needy, [phone]

## 2012-06-20 NOTE — H&P (Addendum)
Primary Care Physician:  [name], MD Primary Gastroenterologist:  Dr. Darrick Penna  Pre-Procedure History & Physical: HPI:  Penny [name] is a 26 y.o. female here for NAUSEA/VOMITING/ABD PAIN.   Past Medical History  Diagnosis Date  . Migraines   . Interstitial cystitis   . IUD FEB 2010  . Gastritis JULY 2011  . Depression   . 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 Fatty liver   . Obesity (BMI 30-39.9) 2011 228 LBS BMI 36.8  . Iron deficiency anemia 07/23/2010  . Irritable bowel syndrome 2012 DIARRHEA    JUN 2012 TTG IgA 14.9  . Elevated liver enzymes JUL 2011ALK PHOS 111-127 AST  143-267 ALT  213-321T BILI 0.6  ALB  3.7-4.06 Jun 2011 ALK PHOS 118 AST 24 ALT 42 T BILI 0.4 ALB 3.9  . Chronic daily headache   . Ovarian cyst   . PONV (postoperative nausea and vomiting)     Past Surgical History  Procedure Date  . Gab 2007    in [place]-POUCH 5 CM  . Cholecystectomy 2005    biliary dyskinesia  . Cath self every nite     for sodium bicarb injection (discontinued 2013)  . Colonoscopy JUN 2012 ABD PN/DIARRHEA WITH PROPOFOL    NL COLON  . Upper gastrointestinal endoscopy JULY 2011 NAUSEA-D125,V6, PH 25    Bx; GASTRITIS, POUCH-5 CM LONG  . Dilation and curettage of uterus   . Tonsillectomy   . Gastric bypass 06/2006    Prior to Admission medications   Medication Sig Start Date End Date Taking? Authorizing Provider  clobetasol cream (TEMOVATE) 0.05 % Apply topically 2 (two) times daily. For skin disorder. 03/13/12   Viviann Spare, FNP  dicyclomine (BENTYL) 10 MG capsule Take 10 mg by mouth 3 (three) times daily as needed. For smooth muscle cramping. 03/13/12   Viviann Spare, FNP  eletriptan (RELPAX) 40 MG tablet One tablet by mouth at onset of headache. May repeat in 2 hours if headache persists or recurs. may repeat in 2 hours if necessary    Historical Provider, MD  levonorgestrel (MIRENA) 20 MCG/24HR IUD 1 each by Intrauterine route once.      Historical Provider,  MD  loratadine (CLARITIN) 10 MG tablet Take 10 mg by mouth daily.    Historical Provider, MD  pantoprazole (PROTONIX) 20 MG tablet Take 20 mg by mouth 2 (two) times daily. 06/03/12   Ward Givens, MD    Allergies as of 06/14/2012 - Review Complete 06/14/2012  Allergen Reaction Noted  . Codeine Nausea And Vomiting   . Gabapentin    . Metoclopramide hcl  05/27/2011  . Latex Rash   . Tape Rash 03/08/2012    Family History  Problem Relation Age of Onset  . Hemochromatosis Maternal Grandmother   . Migraines Maternal Grandmother   . Cancer Maternal Grandmother   . Hypertension Father   . Diabetes Father   . Coronary artery disease Father   . Migraines Paternal Grandmother   . Cancer Mother     breast  . Coronary artery disease Paternal Grandfather   . Hemochromatosis Mother     History   Social History  . Marital Status: Legally Separated    Spouse Name: N/A    Number of Children: 1  . Years of Education: N/A   Occupational History  .     Social History Main Topics  . Smoking status: Current Everyday Smoker -- 0.5  packs/day for 5 years    Types: Cigarettes  . Smokeless tobacco: Never Used  . Alcohol Use: Yes     occasionally  . Drug Use: No  . Sexually Active: No   Other Topics Concern  . Not on file   Social History Narrative  . No narrative on file    Review of Systems: See HPI, otherwise negative ROS   Physical Exam: There were no vitals taken for this visit. General:   Alert,  pleasant and cooperative in NAD Head:  Normocephalic and atraumatic. Neck:  Supple;  Lungs:  Clear throughout to auscultation.    Heart:  Regular rate and rhythm. Abdomen:  Soft, nontender and nondistended. Normal bowel sounds, without guarding, and without rebound.   Neurologic:  Alert and  oriented x4;  grossly normal neurologically.  Impression/Plan:     NAUSEA/VOMTIING/ABDOMINAL PAN  PLAN: EGD/?DIL OF ANASTOMOSIS/ESOPHAGUS TODAY

## 2012-06-20 NOTE — Anesthesia Postprocedure Evaluation (Signed)
  Anesthesia Post-op Note  [name]  Procedure(s) Performed: Procedure(s) (LRB): ESOPHAGOGASTRODUODENOSCOPY (EGD) WITH PROPOFOL (N/A) SAVORY DILATION (N/A)  Patient Location: PACU  Anesthesia Type: MAC  Level of Consciousness: awake  Airway and Oxygen Therapy: Patient Spontanous Breathing  Post-op Pain: none  Post-op Assessment: Post-op Vital signs reviewed, Patient's Cardiovascular Status Stable, Respiratory Function Stable, Patent Airway and No signs of Nausea or vomiting  Post-op Vital Signs: Reviewed and stable  Complications: No apparent anesthesia complications

## 2012-06-21 ENCOUNTER — Encounter (HOSPITAL_COMMUNITY): Admission: RE | Admit: 2012-06-21 | Payer: Medicaid Other | Source: Ambulatory Visit

## 2012-06-22 ENCOUNTER — Encounter (HOSPITAL_COMMUNITY): Payer: Self-pay | Admitting: Gastroenterology

## 2012-06-23 ENCOUNTER — Ambulatory Visit (HOSPITAL_COMMUNITY): Admission: RE | Admit: 2012-06-23 | Payer: Medicaid Other | Source: Ambulatory Visit | Admitting: Gastroenterology

## 2012-06-23 ENCOUNTER — Encounter (HOSPITAL_COMMUNITY): Admission: RE | Payer: Self-pay | Source: Ambulatory Visit

## 2012-06-23 SURGERY — EGD (ESOPHAGOGASTRODUODENOSCOPY)
Anesthesia: Moderate Sedation

## 2012-06-26 ENCOUNTER — Telehealth: Payer: Self-pay | Admitting: *Deleted

## 2012-06-26 NOTE — Telephone Encounter (Signed)
Ms Randa Evens called today. She would like a referral to Dr Clent Ridges. She states that she has spoken with Dr Darrick Penna about this, and is following up.  She has seen Dr Clent Ridges in the past, however they need a new referral for her.

## 2012-06-26 NOTE — Telephone Encounter (Signed)
Patient has been referred to Dr. Clent Ridges on August 6th at 2:00 our records have been faxed and patient is aware

## 2012-06-26 NOTE — Telephone Encounter (Signed)
Leigh-Ann can you please see if we can send them a new referral.

## 2012-06-26 NOTE — Telephone Encounter (Signed)
MAKE REFERRAL TO DR. Clent Ridges AS WELL, dX: ABD PAIN AFTER GASTRIC BYPASS.

## 2012-06-26 NOTE — Telephone Encounter (Signed)
Dr. Clent Ridges is the Dr. Claiborne Billings preformed gastric bypass surgery on Ms Penny [name] in the past and I discussed this before with [name] and she advised me that the patient needs to be referred to [facility] for GI issues and that referral has been made, Verlon Au or Dr. Darrick Penna please advise further instructions?

## 2012-06-28 ENCOUNTER — Telehealth: Payer: Self-pay | Admitting: Gastroenterology

## 2012-06-28 NOTE — Telephone Encounter (Signed)
Called and informed pt.  

## 2012-06-28 NOTE — Telephone Encounter (Signed)
Faxed to PCP

## 2012-06-28 NOTE — Telephone Encounter (Signed)
Please call pt. Penny [name] stomach Bx shows gastritis. Penny [name] small bowel biopsy shows duodenitis from taking NSAIDs.    CONTINUE PROTONIX & USE BENTYL AS NEEDED FOR ABDOMINAL PAIN OR DIARRHEA.  AVOID ASPIRIN, BC/GOODY'S, IBUPROFEN, MOTRIN OR ALEVE. TYLENOL AS NEEDED FOR ABDOMINAL PAIN.  FIND A NEW MENTAL HEALTH SPECIALIST.  SEE Dr. Clent Ridges.   FOLLOW A SOFT MECHANICAL DIET. SEE INFO BELOW. Eat 4-6 small meals daily.  FOLLOW UP WITH [facility] WITH [facility] REGARDING YOUR PAIN AND ELEVATED LIVER ENZYMES .  FOLLOW UP WITH DR. Solveig Fangman IN 3 MOS E30.

## 2012-07-03 ENCOUNTER — Other Ambulatory Visit: Payer: Self-pay

## 2012-07-04 MED ORDER — PROMETHAZINE HCL 50 MG PO TABS: 25.0000 mg | ORAL_TABLET | Freq: Four times a day (QID) | ORAL | Status: DC | PRN

## 2012-07-30 IMAGING — CR DG ABDOMEN ACUTE W/ 1V CHEST
4 series · 4 of 4 positions shown · non-contrast
Comparison: 12/17/2010 chest radiograph, 05/09/2011 abdominal CT

CLINICAL DATA: Abdominal pain

ACUTE ABDOMEN SERIES (ABDOMEN 2 VIEW & CHEST 1 VIEW)

[view not recorded (1 of 4)]
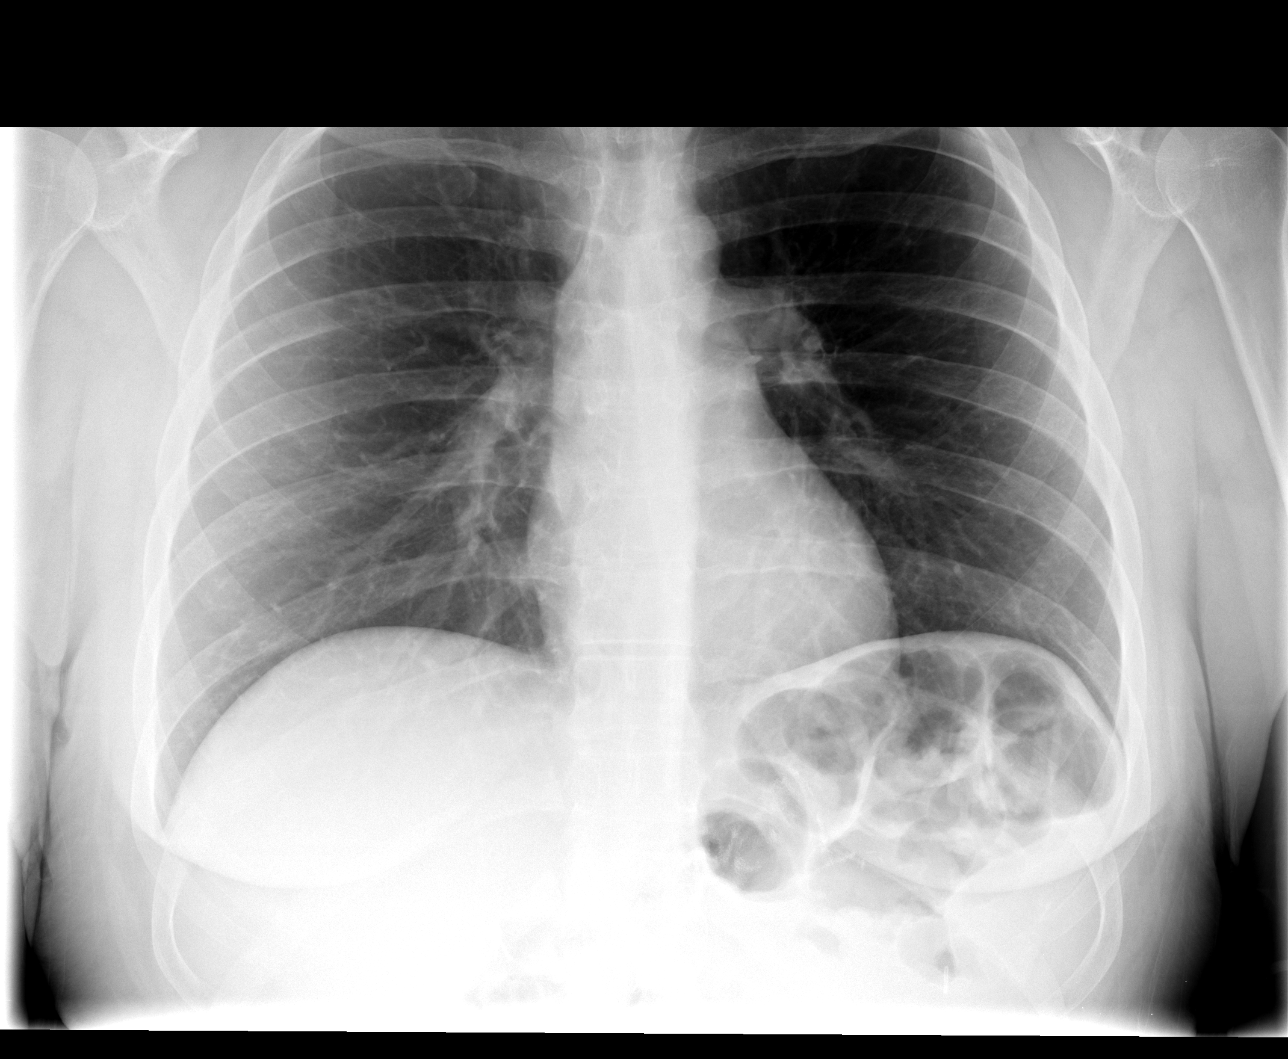

[view not recorded (2 of 4)]
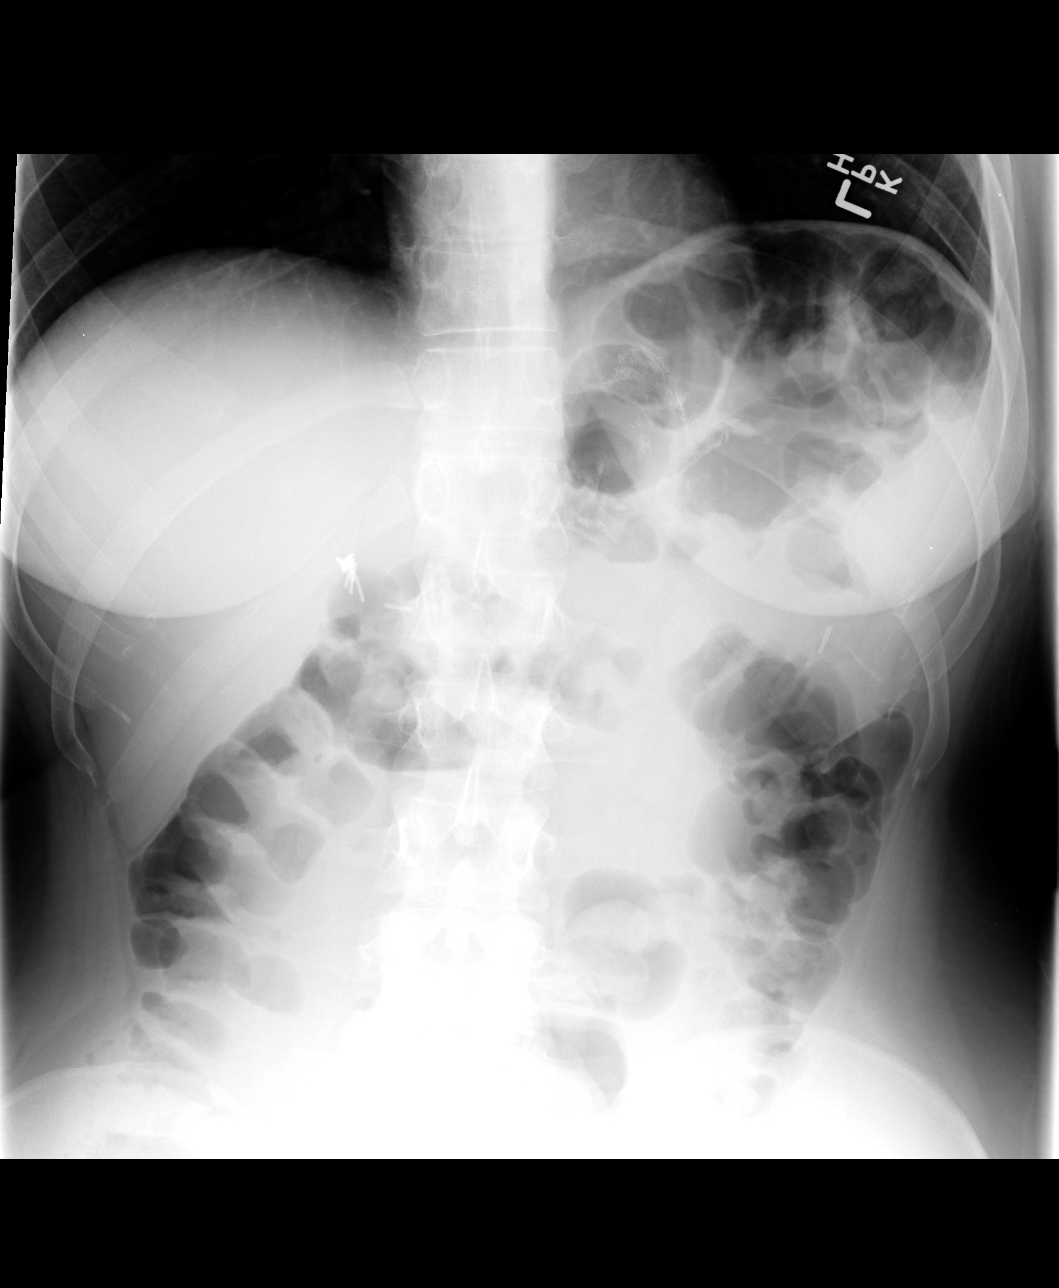

[view not recorded (3 of 4)]
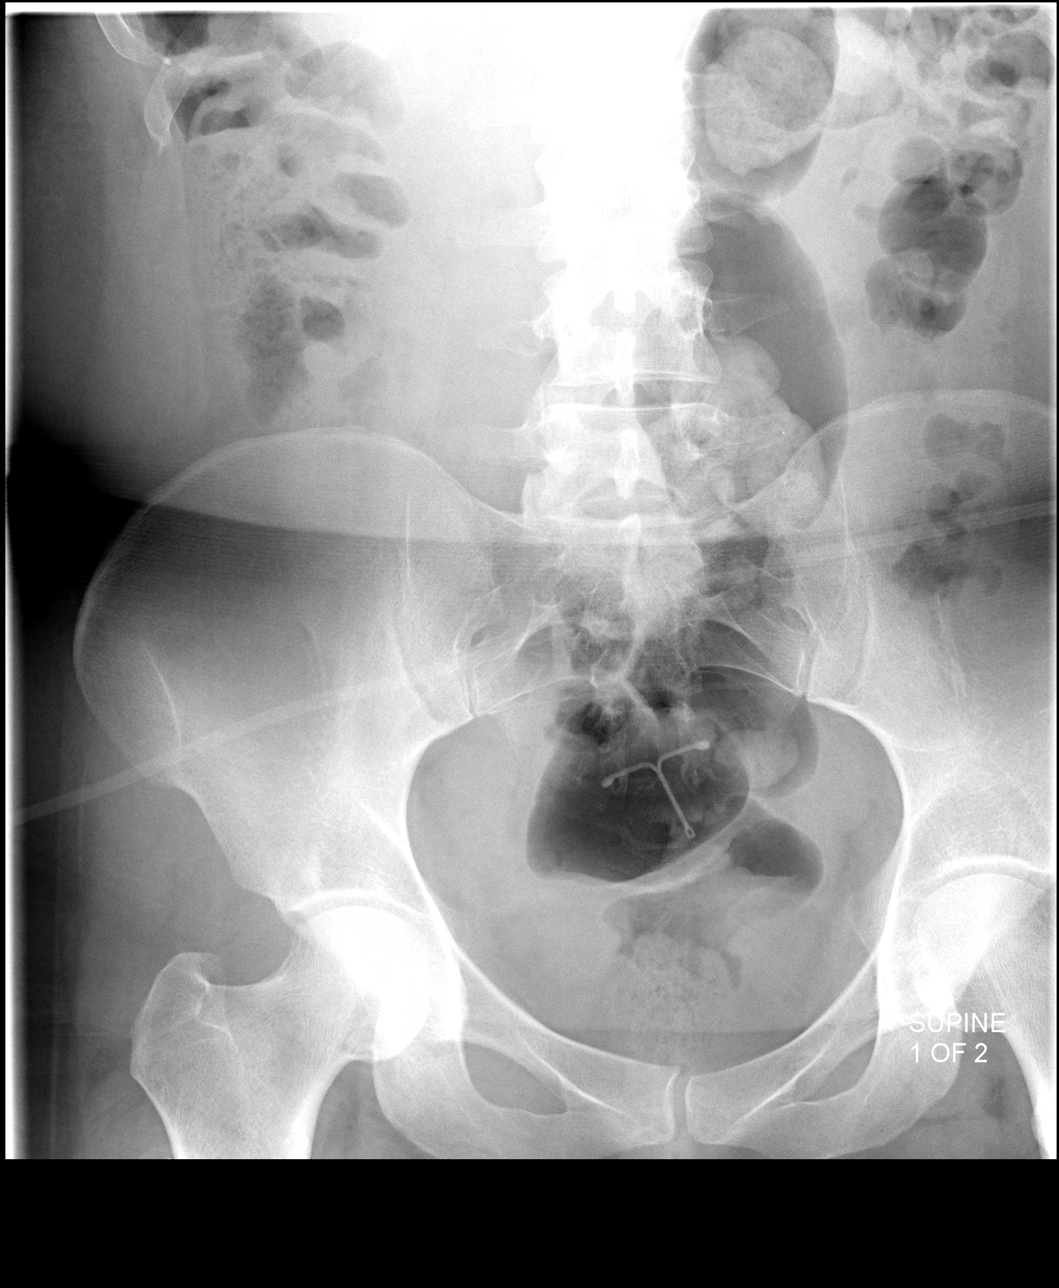

[view not recorded (4 of 4)]
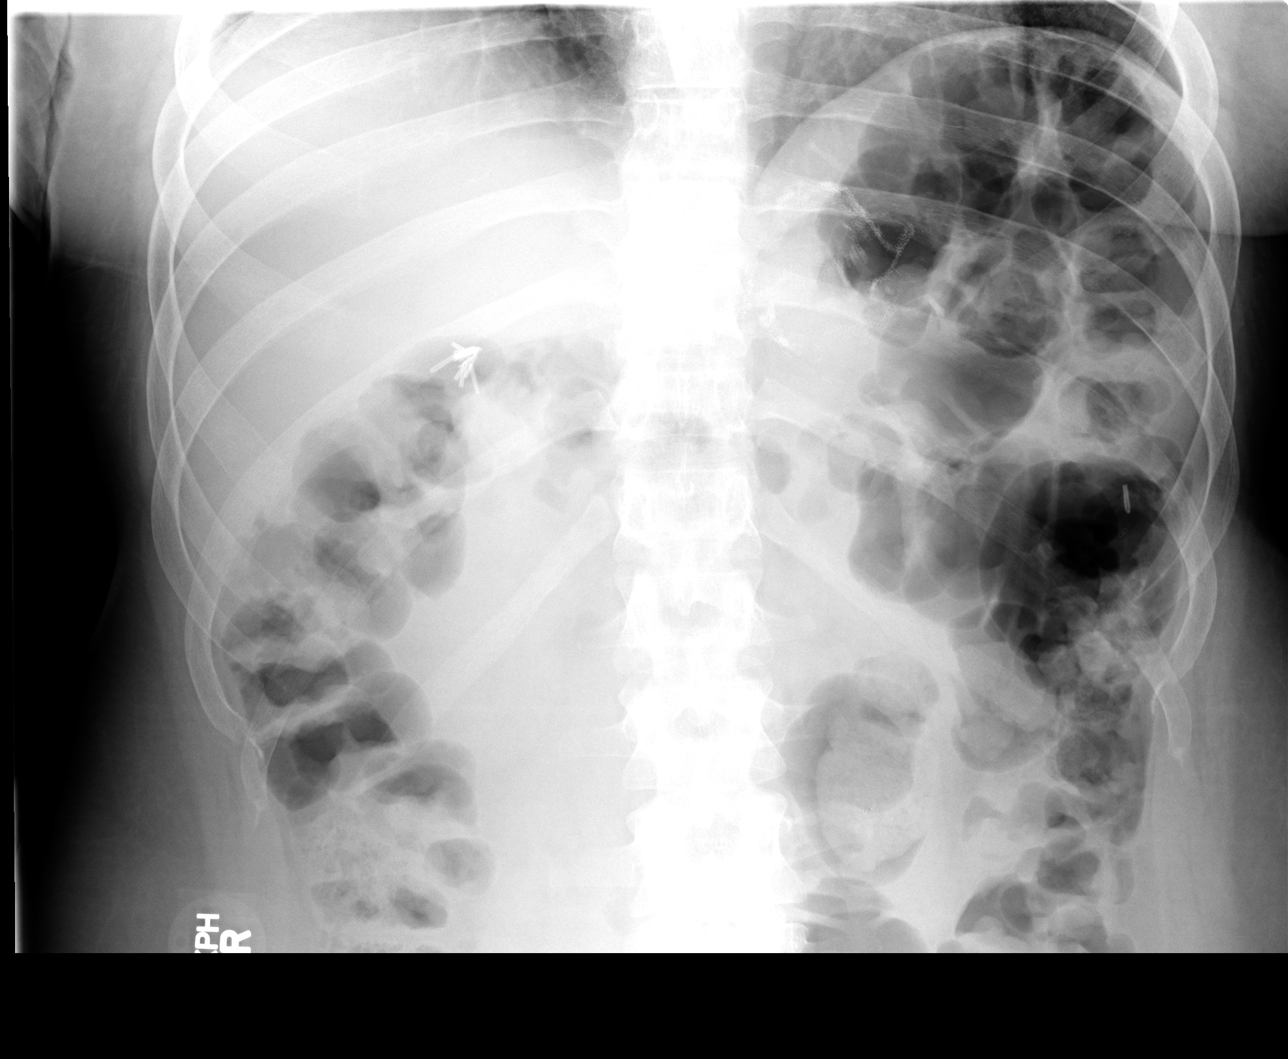

[4 of 4 positions shown; findings below may reference images not displayed]

FINDINGS: Lungs are clear.  Cardiomediastinal contours within
normal range.

No free intraperitoneal air.  Surgical clips right upper quadrant.
IUD projects over the pelvis. The bowel gas pattern is non-
obstructive. Organ outlines are normal where seen. No acute or
aggressive osseous abnormality identified.
IMPRESSION: Nonobstructive bowel gas pattern.

## 2012-08-13 ENCOUNTER — Encounter (HOSPITAL_COMMUNITY): Payer: Self-pay | Admitting: *Deleted

## 2012-08-13 ENCOUNTER — Emergency Department (HOSPITAL_COMMUNITY)
Admission: EM | Admit: 2012-08-13 | Discharge: 2012-08-13 | Disposition: A | Discharge status: Home / Self Care | Payer: BC Managed Care – PPO | Attending: Emergency Medicine | Admitting: Emergency Medicine

## 2012-08-13 DIAGNOSIS — G43909 Migraine, unspecified, not intractable, without status migrainosus: Secondary | ICD-10-CM

## 2012-08-13 DIAGNOSIS — F3289 Other specified depressive episodes: Secondary | ICD-10-CM | POA: Insufficient documentation

## 2012-08-13 DIAGNOSIS — F329 Major depressive disorder, single episode, unspecified: Secondary | ICD-10-CM | POA: Insufficient documentation

## 2012-08-13 DIAGNOSIS — F17200 Nicotine dependence, unspecified, uncomplicated: Secondary | ICD-10-CM | POA: Insufficient documentation

## 2012-08-13 DIAGNOSIS — G43809 Other migraine, not intractable, without status migrainosus: Secondary | ICD-10-CM | POA: Insufficient documentation

## 2012-08-13 DIAGNOSIS — Z9884 Bariatric surgery status: Secondary | ICD-10-CM | POA: Insufficient documentation

## 2012-08-13 DIAGNOSIS — K589 Irritable bowel syndrome without diarrhea: Secondary | ICD-10-CM | POA: Insufficient documentation

## 2012-08-13 DIAGNOSIS — Z886 Allergy status to analgesic agent status: Secondary | ICD-10-CM | POA: Insufficient documentation

## 2012-08-13 MED ORDER — PROMETHAZINE HCL 25 MG/ML IJ SOLN
25.0000 mg | Freq: Once | INTRAMUSCULAR | Status: AC
  Administered 2012-08-13: 25 mg via INTRAMUSCULAR
  Filled 2012-08-13: qty 1

## 2012-08-13 MED ORDER — DIPHENHYDRAMINE HCL 50 MG/ML IJ SOLN
50.0000 mg | Freq: Once | INTRAMUSCULAR | Status: AC
  Administered 2012-08-13: 50 mg via INTRAMUSCULAR
  Filled 2012-08-13: qty 1

## 2012-08-13 MED ORDER — DEXAMETHASONE 4 MG PO TABS
10.0000 mg | ORAL_TABLET | Freq: Once | ORAL | Status: AC
  Administered 2012-08-13: 10 mg via ORAL
  Filled 2012-08-13: qty 2.5

## 2012-08-13 MED ORDER — HYDROMORPHONE HCL PF 1 MG/ML IJ SOLN
1.0000 mg | Freq: Once | INTRAMUSCULAR | Status: AC
  Administered 2012-08-13: 1 mg via INTRAMUSCULAR
  Filled 2012-08-13: qty 1

## 2012-08-13 NOTE — ED Notes (Signed)
Pt presents with chronic migraine x 1 week with worsening pain this morning and n/v.  Pt states cannot see neurologist for 3 months. Pt states nothing has helped with pain.  Denies vision changes at this time.

## 2012-08-13 NOTE — ED Notes (Signed)
C/o migraine headache for a week, pain is associated with n/v

## 2012-08-13 NOTE — ED Provider Notes (Signed)
History     CSN: [id number]  Arrival date & time 08/13/12  1329   First MD Initiated Contact with Patient 08/13/12 1341      Chief Complaint  Patient presents with  . Migraine     HPI Pt was seen at 1400.  Per pt, c/o gradual onset and persistence of constant acute flair of her chronic migraine headache since last night. Has been associated with nausea. Describes the headache as per her usual chronic migraine headache pain pattern for many years.  Denies headache was sudden or maximal in onset or at any time.  Denies visual changes, no focal motor weakness, no tingling/numbness in extremities, no fevers, no neck pain, no rash.      Past Medical History  Diagnosis Date  . Migraines   . Interstitial cystitis   . IUD FEB 2010  . Gastritis JULY 2011  . Depression   . 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 Fatty liver   . Obesity (BMI 30-39.9) 2011 228 LBS BMI 36.8  . Iron deficiency anemia 07/23/2010  . Irritable bowel syndrome 2012 DIARRHEA    JUN 2012 TTG IgA 14.9  . Elevated liver enzymes JUL 2011ALK PHOS 111-127 AST  143-267 ALT  213-321T BILI 0.6  ALB  3.7-4.06 Jun 2011 ALK PHOS 118 AST 24 ALT 42 T BILI 0.4 ALB 3.9  . Chronic daily headache   . Ovarian cyst   . PONV (postoperative nausea and vomiting)     Past Surgical History  Procedure Date  . Gab 2007    in [place]-POUCH 5 CM  . Cholecystectomy 2005    biliary dyskinesia  . Cath self every nite     for sodium bicarb injection (discontinued 2013)  . Colonoscopy JUN 2012 ABD PN/DIARRHEA WITH PROPOFOL    NL COLON  . Upper gastrointestinal endoscopy JULY 2011 NAUSEA-D125,V6, PH 25    Bx; GASTRITIS, POUCH-5 CM LONG  . Dilation and curettage of uterus   . Tonsillectomy   . Gastric bypass 06/2006  . Savory dilation 06/20/2012    Procedure: SAVORY DILATION;  Surgeon: West Bali, MD;  Location: [facility];  Service: Endoscopy;  Laterality: N/A;  16 savory    Family History  Problem Relation Age of Onset  .  Hemochromatosis Maternal Grandmother   . Migraines Maternal Grandmother   . Cancer Maternal Grandmother   . Hypertension Father   . Diabetes Father   . Coronary artery disease Father   . Migraines Paternal Grandmother   . Cancer Mother     breast  . Coronary artery disease Paternal Grandfather   . Hemochromatosis Mother     History  Substance Use Topics  . Smoking status: Current Everyday Smoker -- 0.5 packs/day for 5 years    Types: Cigarettes  . Smokeless tobacco: Never Used  . Alcohol Use: Yes     occasionally    OB History    Grav Para Term Preterm Abortions TAB SAB Ect Mult Living   2 1 1  1  1   1       Review of Systems ROS: Statement: All systems negative except as marked or noted in the HPI; Constitutional: Negative for fever and chills. ; ; Eyes: Negative for eye pain, redness and discharge. ; ; ENMT: Negative for ear pain, hoarseness, nasal congestion, sinus pressure and sore throat. ; ; Cardiovascular: Negative for chest pain, palpitations, diaphoresis, dyspnea and peripheral edema. ; ; Respiratory:  Negative for cough, wheezing and stridor. ; ; Gastrointestinal: +nausea. Negative for vomiting, diarrhea, abdominal pain, blood in stool, hematemesis, jaundice and rectal bleeding. . ; ; Genitourinary: Negative for dysuria, flank pain and hematuria. ; ; Musculoskeletal: Negative for back pain and neck pain. Negative for swelling and trauma.; ; Skin: Negative for pruritus, rash, abrasions, blisters, bruising and skin lesion.; ; Neuro: +headache. Negative for lightheadedness and neck stiffness. Negative for weakness, altered level of consciousness , altered mental status, extremity weakness, paresthesias, involuntary movement, seizure and syncope.       Allergies  Codeine; Gabapentin; Metoclopramide hcl; Latex; and Tape  Home Medications   Current Outpatient Rx  Name Route Sig Dispense Refill  . AMPHETAMINE-DEXTROAMPHET ER 30 MG PO CP24 Oral Take 30 mg by mouth daily.      Marland Kitchen ELETRIPTAN HYDROBROMIDE 40 MG PO TABS Oral One tablet by mouth at onset of headache. May repeat in 2 hours if headache persists or recurs. may repeat in 2 hours if necessary    . IBUPROFEN 200 MG PO TABS Oral Take 800 mg by mouth every 6 (six) hours as needed. Pain    . LORATADINE 10 MG PO TABS Oral Take 10 mg by mouth daily as needed. Pain    . LEVONORGESTREL 20 MCG/24HR IU IUD Intrauterine 1 each by Intrauterine route once.        BP 136/77  Pulse 92  Temp 97.8 F (36.6 C) (Oral)  Resp 18  Ht 5\' 6"  (1.676 m)  Wt 201 lb 3.2 oz (91.264 kg)  BMI 32.47 kg/m2  SpO2 100%  Physical Exam 1405: Physical examination:  Nursing notes reviewed; Vital signs and O2 SAT reviewed;  Constitutional: Well developed, Well nourished, Well hydrated, In no acute distress; Head:  Normocephalic, atraumatic; Eyes: EOMI, PERRL, No scleral icterus; ENMT: TM's clear bilat. Mouth and pharynx normal, Mucous membranes moist; Neck: Supple, Full range of motion, No lymphadenopathy; Cardiovascular: Regular rate and rhythm, No murmur, rub, or gallop; Respiratory: Breath sounds clear & equal bilaterally, No rales, rhonchi, wheezes.  Speaking full sentences with ease, Normal respiratory effort/excursion; Chest: Nontender, Movement normal;; Extremities: Pulses normal, No tenderness, No edema, No calf edema or asymmetry.; Neuro: AA&Ox3, Major CN grossly intact.  Speech clear. No gross focal motor or sensory deficits in extremities.; Skin: Color normal, Warm, Dry.   ED Course  Procedures    MDM  MDM Reviewed: nursing note, vitals and previous chart     1425:  Long hx of chronic migraine headaches with multiple ED visits for same.  Pt endorses acute flair of her usual long standing chronic headache pain today, no change from her usual chronic pain pattern.  Pt encouraged to f/u with her PMD and Neurologist/Headache specialist for good continuity of care and control of her chronic pain.  Verb understanding.         Laray Anger, DO 08/14/12 0930

## 2012-08-16 ENCOUNTER — Other Ambulatory Visit: Payer: Self-pay | Admitting: Gastroenterology

## 2012-08-16 MED ORDER — PROMETHAZINE HCL 25 MG PO TABS: 25.0000 mg | ORAL_TABLET | Freq: Four times a day (QID) | ORAL | Status: DC | PRN

## 2012-08-31 ENCOUNTER — Encounter: Payer: Self-pay | Admitting: *Deleted

## 2012-09-04 ENCOUNTER — Other Ambulatory Visit: Payer: Self-pay | Admitting: Gastroenterology

## 2012-09-19 ENCOUNTER — Ambulatory Visit (HOSPITAL_COMMUNITY)
Admission: RE | Admit: 2012-09-19 | Discharge: 2012-09-19 | Disposition: A | Discharge status: Home / Self Care | Payer: BC Managed Care – PPO | Source: Ambulatory Visit | Attending: Family Medicine | Admitting: Family Medicine

## 2012-09-19 ENCOUNTER — Other Ambulatory Visit: Payer: Self-pay | Admitting: Family Medicine

## 2012-09-19 DIAGNOSIS — IMO0002 Reserved for concepts with insufficient information to code with codable children: Secondary | ICD-10-CM | POA: Insufficient documentation

## 2012-09-19 DIAGNOSIS — M792 Neuralgia and neuritis, unspecified: Secondary | ICD-10-CM

## 2012-09-19 DIAGNOSIS — M542 Cervicalgia: Secondary | ICD-10-CM | POA: Insufficient documentation

## 2012-09-19 DIAGNOSIS — M79609 Pain in unspecified limb: Secondary | ICD-10-CM | POA: Insufficient documentation

## 2012-09-20 ENCOUNTER — Other Ambulatory Visit: Payer: Self-pay | Admitting: Family Medicine

## 2012-09-20 DIAGNOSIS — M549 Dorsalgia, unspecified: Secondary | ICD-10-CM

## 2012-09-21 ENCOUNTER — Emergency Department (HOSPITAL_COMMUNITY)
Admission: EM | Admit: 2012-09-21 | Discharge: 2012-09-22 | Disposition: A | Discharge status: Home / Self Care | Payer: BC Managed Care – PPO | Attending: Emergency Medicine | Admitting: Emergency Medicine

## 2012-09-21 ENCOUNTER — Encounter (HOSPITAL_COMMUNITY): Payer: Self-pay | Admitting: Emergency Medicine

## 2012-09-21 DIAGNOSIS — M79609 Pain in unspecified limb: Secondary | ICD-10-CM | POA: Insufficient documentation

## 2012-09-21 DIAGNOSIS — M546 Pain in thoracic spine: Secondary | ICD-10-CM | POA: Insufficient documentation

## 2012-09-21 DIAGNOSIS — M542 Cervicalgia: Secondary | ICD-10-CM

## 2012-09-21 DIAGNOSIS — Z886 Allergy status to analgesic agent status: Secondary | ICD-10-CM | POA: Insufficient documentation

## 2012-09-21 DIAGNOSIS — F17200 Nicotine dependence, unspecified, uncomplicated: Secondary | ICD-10-CM | POA: Insufficient documentation

## 2012-09-21 DIAGNOSIS — F329 Major depressive disorder, single episode, unspecified: Secondary | ICD-10-CM | POA: Insufficient documentation

## 2012-09-21 DIAGNOSIS — F3289 Other specified depressive episodes: Secondary | ICD-10-CM | POA: Insufficient documentation

## 2012-09-21 DIAGNOSIS — Z9884 Bariatric surgery status: Secondary | ICD-10-CM | POA: Insufficient documentation

## 2012-09-21 MED ORDER — OXYCODONE-ACETAMINOPHEN 5-325 MG PO TABS
1.0000 | ORAL_TABLET | Freq: Once | ORAL | Status: AC
  Administered 2012-09-22: 1 via ORAL
  Filled 2012-09-21: qty 1

## 2012-09-21 MED ORDER — PROMETHAZINE HCL 12.5 MG PO TABS
25.0000 mg | ORAL_TABLET | Freq: Once | ORAL | Status: AC
  Administered 2012-09-22: 25 mg via ORAL
  Filled 2012-09-21: qty 2

## 2012-09-21 MED ORDER — DIAZEPAM 5 MG PO TABS
10.0000 mg | ORAL_TABLET | Freq: Once | ORAL | Status: AC
  Administered 2012-09-22: 10 mg via ORAL
  Filled 2012-09-21: qty 2

## 2012-09-21 NOTE — ED Provider Notes (Signed)
History  This chart was scribed for Penny Melter, MD by Ladona Ridgel Day. This patient was seen in room [facility] and the patient's care was started at 2225.   CSN: [id number]  Arrival date & time 09/21/12  2225   First MD Initiated Contact with Patient 09/21/12 2343      Chief Complaint  Patient presents with  . Neck Pain   The history is provided by the patient. No language interpreter was used.   Penny [name] is a 26 y.o. female who presents to the Emergency Department complaining of bilateral arm pain and workup with Dr. Gerda Diss for possible pinched nerve. Since 2 weeks ago she has had right shoulder, neck pain, mid back pain, neck soreness, and associated chronic migraines. She states had X-rays yesterday at Dr. Fletcher Anon and scheduled for MRI here next Wednesday. She denies fever. She has no periods w/ IUD.  Past Medical History  Diagnosis Date  . Migraines   . Interstitial cystitis   . IUD FEB 2010  . Gastritis JULY 2011  . Depression   . 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 Fatty liver   . Obesity (BMI 30-39.9) 2011 228 LBS BMI 36.8  . Iron deficiency anemia 07/23/2010  . Irritable bowel syndrome 2012 DIARRHEA    JUN 2012 TTG IgA 14.9  . Elevated liver enzymes JUL 2011ALK PHOS 111-127 AST  143-267 ALT  213-321T BILI 0.6  ALB  3.7-4.06 Jun 2011 ALK PHOS 118 AST 24 ALT 42 T BILI 0.4 ALB 3.9  . Chronic daily headache   . Ovarian cyst   . PONV (postoperative nausea and vomiting)     Past Surgical History  Procedure Date  . Gab 2007    in [place]-POUCH 5 CM  . Cholecystectomy 2005    biliary dyskinesia  . Cath self every nite     for sodium bicarb injection (discontinued 2013)  . Colonoscopy JUN 2012 ABD PN/DIARRHEA WITH PROPOFOL    NL COLON  . Upper gastrointestinal endoscopy JULY 2011 NAUSEA-D125,V6, PH 25    Bx; GASTRITIS, POUCH-5 CM LONG  . Dilation and curettage of uterus   . Tonsillectomy   . Gastric bypass 06/2006  . Savory dilation 06/20/2012   Procedure: SAVORY DILATION;  Surgeon: West Bali, MD;  Location: [facility];  Service: Endoscopy;  Laterality: N/A;  16 savory    Family History  Problem Relation Age of Onset  . Hemochromatosis Maternal Grandmother   . Migraines Maternal Grandmother   . Cancer Maternal Grandmother   . Hypertension Father   . Diabetes Father   . Coronary artery disease Father   . Migraines Paternal Grandmother   . Cancer Mother     breast  . Coronary artery disease Paternal Grandfather   . Hemochromatosis Mother     History  Substance Use Topics  . Smoking status: Current Every Day Smoker -- 0.5 packs/day for 5 years    Types: Cigarettes  . Smokeless tobacco: Never Used  . Alcohol Use: Yes     occasionally    OB History    Grav Para Term Preterm Abortions TAB SAB Ect Mult Living   2 1 1  1  1   1       Review of Systems  Constitutional: Negative for fever and chills.  Respiratory: Negative for shortness of breath.   Gastrointestinal: Positive for nausea. Negative for vomiting and abdominal pain.  Musculoskeletal: Positive for back pain.  Right shoulder, neck, and right arm pain  All other systems reviewed and are negative.  A complete 10 system review of systems was obtained and all systems are negative except as noted in the HPI and PMH.    Allergies  Codeine; Gabapentin; Metoclopramide hcl; Latex; and Tape  Home Medications   Current Outpatient Rx  Name Route Sig Dispense Refill  . AMPHETAMINE-DEXTROAMPHET ER 30 MG PO CP24 Oral Take 30 mg by mouth daily.    Marland Kitchen DIAZEPAM 10 MG PO TABS Oral Take 1 tablet (10 mg total) by mouth every 8 (eight) hours as needed (muscle spasm). 30 tablet 0  . ELETRIPTAN HYDROBROMIDE 40 MG PO TABS Oral One tablet by mouth at onset of headache. May repeat in 2 hours if headache persists or recurs. may repeat in 2 hours if necessary    . IBUPROFEN 200 MG PO TABS Oral Take 800 mg by mouth every 6 (six) hours as needed. Pain    . LEVONORGESTREL 20  MCG/24HR IU IUD Intrauterine 1 each by Intrauterine route once.      Marland Kitchen LORATADINE 10 MG PO TABS Oral Take 10 mg by mouth daily as needed. Pain    . PROMETHAZINE HCL 25 MG PO TABS Oral Take 1 tablet (25 mg total) by mouth every 6 (six) hours as needed for nausea. 30 tablet 0  . ULTRAM 50 MG PO TABS  TAKE 1 TABLET EVERY 6 HOURS AS NEEDED FOR PAIN. 30 tablet 0    Triage vitals: BP 135/83  Pulse 88  Temp 98.3 F (36.8 C) (Oral)  Resp 18  Ht 5\' 6"  (1.676 m)  Wt 197 lb (89.359 kg)  BMI 31.80 kg/m2  SpO2 99%  Physical Exam  Nursing note and vitals reviewed. Constitutional: She is oriented to person, place, and time. She appears well-developed and well-nourished. No distress.  HENT:  Head: Normocephalic and atraumatic.  Mouth/Throat: Oropharynx is clear and moist.  Eyes: Conjunctivae normal and EOM are normal.  Neck: Neck supple. No tracheal deviation present.  Cardiovascular: Normal rate, regular rhythm and normal heart sounds.   Pulmonary/Chest: Effort normal and breath sounds normal. No respiratory distress. She has no wheezes. She has no rales.  Musculoskeletal: Normal range of motion. She exhibits tenderness.       C-spine tenderness, thoracic tenderness, tender over right trapezius, tender of right anterior shoulder, resistance to motion of right shoulder secondary to pain, paravertebral muscles of right thoracic and lumbar area.      Neurological: She is alert and oriented to person, place, and time. No cranial nerve deficit.       Normal sensation BUE, no numbness over face  Skin: Skin is warm and dry.  Psychiatric: She has a normal mood and affect. Her behavior is normal.    ED Course  Procedures (including critical care time) DIAGNOSTIC STUDIES: Oxygen Saturation is 99% on room air, normal by my interpretation.    COORDINATION OF CARE: At 1150 PM Discussed treatment plan with patient which includes phenergan, valium, and percocet for pain. Patient agrees.   Labs  Reviewed - No data to display No results found.   1. Neck pain       MDM  Nonspecific neck pain, with multiple other somatic complaints. She does have an element of muscle spasm. Doubt significant spinal stenosis, or cervical myelopathy. She is stable for discharge, with symptomatic treatment.      I personally performed the services described in this documentation, which was scribed in my presence.  The recorded information has been reviewed and considered.      Plan: Home Medications- Valium and Phenergan; Home Treatments- rest; Recommended follow up- PCP prn     Penny Melter, MD 09/22/12 0410

## 2012-09-21 NOTE — ED Notes (Signed)
Patient c/o neck pain; states Dr. Gerda Diss has started diagnostics for a pinched nerve in C4 or C5. Had xrays earlier this week and has MRI scheduled for Wednesday.  Patient c/o increased pain.

## 2012-09-22 MED ORDER — PROMETHAZINE HCL 25 MG PO TABS: 25.0000 mg | ORAL_TABLET | Freq: Four times a day (QID) | ORAL | Status: DC | PRN

## 2012-09-22 MED ORDER — DIAZEPAM 10 MG PO TABS: 10.0000 mg | ORAL_TABLET | Freq: Three times a day (TID) | ORAL | Status: DC | PRN

## 2012-09-27 ENCOUNTER — Ambulatory Visit (HOSPITAL_COMMUNITY)
Admission: RE | Admit: 2012-09-27 | Discharge: 2012-09-27 | Disposition: A | Discharge status: Home / Self Care | Payer: BC Managed Care – PPO | Source: Ambulatory Visit | Attending: Family Medicine | Admitting: Family Medicine

## 2012-09-27 ENCOUNTER — Other Ambulatory Visit (HOSPITAL_COMMUNITY): Payer: BC Managed Care – PPO

## 2012-09-27 DIAGNOSIS — M5020 Other cervical disc displacement, unspecified cervical region: Secondary | ICD-10-CM | POA: Insufficient documentation

## 2012-09-27 DIAGNOSIS — M542 Cervicalgia: Secondary | ICD-10-CM | POA: Insufficient documentation

## 2012-09-27 DIAGNOSIS — M549 Dorsalgia, unspecified: Secondary | ICD-10-CM

## 2012-10-02 ENCOUNTER — Ambulatory Visit (HOSPITAL_COMMUNITY): Payer: BC Managed Care – PPO

## 2012-10-02 ENCOUNTER — Other Ambulatory Visit: Payer: Self-pay | Admitting: Gastroenterology

## 2012-11-12 ENCOUNTER — Emergency Department (HOSPITAL_COMMUNITY)
Admission: EM | Admit: 2012-11-12 | Discharge: 2012-11-13 | Disposition: A | Discharge status: Home / Self Care | Payer: BC Managed Care – PPO | Attending: Emergency Medicine | Admitting: Emergency Medicine

## 2012-11-12 ENCOUNTER — Encounter (HOSPITAL_COMMUNITY): Payer: Self-pay | Admitting: *Deleted

## 2012-11-12 DIAGNOSIS — K5289 Other specified noninfective gastroenteritis and colitis: Secondary | ICD-10-CM | POA: Insufficient documentation

## 2012-11-12 DIAGNOSIS — R945 Abnormal results of liver function studies: Secondary | ICD-10-CM | POA: Insufficient documentation

## 2012-11-12 DIAGNOSIS — K529 Noninfective gastroenteritis and colitis, unspecified: Secondary | ICD-10-CM

## 2012-11-12 DIAGNOSIS — Z9889 Other specified postprocedural states: Secondary | ICD-10-CM | POA: Insufficient documentation

## 2012-11-12 DIAGNOSIS — K7689 Other specified diseases of liver: Secondary | ICD-10-CM | POA: Insufficient documentation

## 2012-11-12 DIAGNOSIS — Z9884 Bariatric surgery status: Secondary | ICD-10-CM | POA: Insufficient documentation

## 2012-11-12 DIAGNOSIS — R42 Dizziness and giddiness: Secondary | ICD-10-CM | POA: Insufficient documentation

## 2012-11-12 DIAGNOSIS — K2970 Gastritis, unspecified, without bleeding: Secondary | ICD-10-CM | POA: Insufficient documentation

## 2012-11-12 DIAGNOSIS — Z79899 Other long term (current) drug therapy: Secondary | ICD-10-CM | POA: Insufficient documentation

## 2012-11-12 DIAGNOSIS — F17200 Nicotine dependence, unspecified, uncomplicated: Secondary | ICD-10-CM | POA: Insufficient documentation

## 2012-11-12 DIAGNOSIS — Z8659 Personal history of other mental and behavioral disorders: Secondary | ICD-10-CM | POA: Insufficient documentation

## 2012-11-12 DIAGNOSIS — E669 Obesity, unspecified: Secondary | ICD-10-CM | POA: Insufficient documentation

## 2012-11-12 DIAGNOSIS — D649 Anemia, unspecified: Secondary | ICD-10-CM | POA: Insufficient documentation

## 2012-11-12 DIAGNOSIS — R197 Diarrhea, unspecified: Secondary | ICD-10-CM | POA: Insufficient documentation

## 2012-11-12 DIAGNOSIS — K589 Irritable bowel syndrome without diarrhea: Secondary | ICD-10-CM | POA: Insufficient documentation

## 2012-11-12 DIAGNOSIS — N3010 Interstitial cystitis (chronic) without hematuria: Secondary | ICD-10-CM | POA: Insufficient documentation

## 2012-11-12 DIAGNOSIS — R109 Unspecified abdominal pain: Secondary | ICD-10-CM | POA: Insufficient documentation

## 2012-11-12 DIAGNOSIS — Z8679 Personal history of other diseases of the circulatory system: Secondary | ICD-10-CM | POA: Insufficient documentation

## 2012-11-12 DIAGNOSIS — R51 Headache: Secondary | ICD-10-CM | POA: Insufficient documentation

## 2012-11-12 DIAGNOSIS — R112 Nausea with vomiting, unspecified: Secondary | ICD-10-CM | POA: Insufficient documentation

## 2012-11-12 DIAGNOSIS — N83209 Unspecified ovarian cyst, unspecified side: Secondary | ICD-10-CM | POA: Insufficient documentation

## 2012-11-12 LAB — CBC WITH DIFFERENTIAL/PLATELET
Basophils Absolute: 0 10*3/uL (ref 0.0–0.1)
Basophils Relative: 0 % (ref 0–1)
Hemoglobin: 10.6 g/dL — ABNORMAL LOW (ref 12.0–15.0)
Lymphocytes Relative: 35 % (ref 12–46)
MCHC: 32.1 g/dL (ref 30.0–36.0)
Monocytes Relative: 8 % (ref 3–12)
Neutro Abs: 3.1 10*3/uL (ref 1.7–7.7)
Neutrophils Relative %: 57 % (ref 43–77)
WBC: 5.4 10*3/uL (ref 4.0–10.5)

## 2012-11-12 LAB — BASIC METABOLIC PANEL
BUN: 13 mg/dL (ref 6–23)
CO2: 25 mEq/L (ref 19–32)
Chloride: 101 mEq/L (ref 96–112)
GFR calc Af Amer: >90 mL/min (ref >90)
Potassium: 3.8 mEq/L (ref 3.5–5.1)

## 2012-11-12 MED ORDER — SODIUM CHLORIDE 0.9 % IV BOLUS (SEPSIS)
1000.0000 mL | Freq: Once | INTRAVENOUS | Status: AC
  Administered 2012-11-12: 1000 mL via INTRAVENOUS

## 2012-11-12 MED ORDER — ONDANSETRON HCL 4 MG/2ML IJ SOLN
4.0000 mg | Freq: Once | INTRAMUSCULAR | Status: AC
  Administered 2012-11-12: 4 mg via INTRAVENOUS
  Filled 2012-11-12: qty 2

## 2012-11-12 MED ORDER — SODIUM CHLORIDE 0.9 % IV SOLN
INTRAVENOUS | Status: DC
  Administered 2012-11-13 (×2): via INTRAVENOUS

## 2012-11-12 NOTE — ED Notes (Signed)
Pt states she is unable to provide a urine sample at present.

## 2012-11-12 NOTE — ED Notes (Signed)
Pt with heavy vaginal bleeding x 1 month, last week with dizziness and weakness, denies any pain, SOB earlier today

## 2012-11-12 NOTE — ED Provider Notes (Addendum)
History  This chart was scribed for Penny Jakes, MD by Penny [name], ED Scribe. The patient was seen in room APA09/APA09. Patient's care was started at 2313.  CSN: [id number]  Arrival date & time 11/12/12  2209   First MD Initiated Contact with Patient 11/12/12 2313      Chief Complaint  Patient presents with  . Vaginal Bleeding  . Dizziness    Patient is a 26 y.o. female presenting with vaginal bleeding. The history is provided by the patient. No language interpreter was used.  Vaginal Bleeding This is a recurrent problem. The current episode started more than 1 week ago. The problem occurs daily. The problem has been gradually worsening. Associated symptoms include abdominal pain. Pertinent negatives include no chest pain and no shortness of breath. Nothing aggravates the symptoms. Nothing relieves the symptoms. She has tried nothing for the symptoms.    HPI Comments: Penny [name] is a 26 y.o. female who presents to the Emergency Department complaining of dizziness onset a few hours ago. 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 Patient states that she experienced similar dizziness last week. Patient also reports vaginal bleeding onset 1 month ago that has worsened in the past week. Patient has an IUD (4 years) and says that she has never experienced bleeding like this. Patient states associated symptoms of sharp, intermittent, suprapubic abdominal pain, nausea, vomiting, and diarrhea. NVD began yesterday. Patient denies fever, generalized weakness, rhinorrhea, cough, rash, visual changes, chest pain, SOB or dysuria. Patient has not taken any medications for symptoms. She reports no aggravating or relieving factors for any symptoms. Patient has a medical history of ovarian cysts and anemia.    Past Medical History  Diagnosis Date  . Migraines   . Interstitial cystitis   . IUD FEB 2010  .  Gastritis JULY 2011  . Depression   . 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 Fatty liver   . Obesity (BMI 30-39.9) 2011 228 LBS BMI 36.8  . Iron deficiency anemia 07/23/2010  . Irritable bowel syndrome 2012 DIARRHEA    JUN 2012 TTG IgA 14.9  . Elevated liver enzymes JUL 2011ALK PHOS 111-127 AST  143-267 ALT  213-321T BILI 0.6  ALB  3.7-4.06 Jun 2011 ALK PHOS 118 AST 24 ALT 42 T BILI 0.4 ALB 3.9  . Chronic daily headache   . Ovarian cyst   . PONV (postoperative nausea and vomiting)     Past Surgical History  Procedure Date  . Gab 2007    in [place]-POUCH 5 CM  . Cholecystectomy 2005    biliary dyskinesia  . Cath self every nite     for sodium bicarb injection (discontinued 2013)  . Colonoscopy JUN 2012 ABD PN/DIARRHEA WITH PROPOFOL    NL COLON  . Upper gastrointestinal endoscopy JULY 2011 NAUSEA-D125,V6, PH 25    Bx; GASTRITIS, POUCH-5 CM LONG  . Dilation and curettage of uterus   . Tonsillectomy   . Gastric bypass 06/2006  . Savory dilation 06/20/2012    Procedure: SAVORY DILATION;  Surgeon: West Bali, MD;  Location: [facility];  Service: Endoscopy;  Laterality: N/A;  16 savory    Family History  Problem Relation Age of Onset  . Hemochromatosis Maternal Grandmother   . Migraines Maternal Grandmother   . Cancer Maternal Grandmother   . Hypertension Father   .  Diabetes Father   . Coronary artery disease Father   . Migraines Paternal Grandmother   . Cancer Mother     breast  . Coronary artery disease Paternal Grandfather   . Hemochromatosis Mother     History  Substance Use Topics  . Smoking status: Current Every Day Smoker -- 0.5 packs/day for 5 years    Types: Cigarettes  . Smokeless tobacco: Never Used  . Alcohol Use: Yes     Comment: occasionally    OB History    Grav Para Term Preterm Abortions TAB SAB Ect Mult Living   2 1 1  1  1   1       Review of Systems  Constitutional: Negative for fever.  HENT: Negative for rhinorrhea.   Eyes: Negative  for visual disturbance.  Respiratory: Negative for cough and shortness of breath.   Cardiovascular: Negative for chest pain.  Gastrointestinal: Positive for nausea, vomiting, abdominal pain and diarrhea.  Genitourinary: Positive for vaginal bleeding.  Musculoskeletal: Negative.   Skin: Negative for rash.  Neurological: Positive for dizziness.  Psychiatric/Behavioral: Negative.     Allergies  Codeine; Gabapentin; Metoclopramide hcl; Latex; and Tape  Home Medications   Current Outpatient Rx  Name  Route  Sig  Dispense  Refill  . AMPHETAMINE-DEXTROAMPHET ER 30 MG PO CP24   Oral   Take 30 mg by mouth daily.         . IBUPROFEN 200 MG PO TABS   Oral   Take 800 mg by mouth every 6 (six) hours as needed. Pain         . LEVONORGESTREL 20 MCG/24HR IU IUD   Intrauterine   1 each by Intrauterine route once.           Marland Kitchen LOPERAMIDE HCL 2 MG PO CAPS   Oral   Take 1 capsule (2 mg total) by mouth 4 (four) times daily as needed for diarrhea or loose stools.   12 capsule   0   . ONDANSETRON 8 MG PO TBDP   Oral   Take 1 tablet (8 mg total) by mouth every 8 (eight) hours as needed for nausea.   10 tablet   0     Triage Vitals: BP 125/75  Pulse 104  Temp 98 F (36.7 C) (Oral)  Ht 5\' 6"  (1.676 m)  Wt 190 lb (86.183 kg)  BMI 30.67 kg/m2  SpO2 100%  Physical Exam  Constitutional: She is oriented to person, place, and time. She appears well-developed and well-nourished.  HENT:  Head: Normocephalic and atraumatic.  Mouth/Throat: Oropharynx is clear and moist and mucous membranes are normal. Mucous membranes are not dry.  Eyes: Conjunctivae normal and EOM are normal. Pupils are equal, round, and reactive to light.       Sclera with good blood vessels bilaterally.  Neck: Neck supple.  Cardiovascular: Normal rate and regular rhythm.   No murmur heard. Pulmonary/Chest: Effort normal and breath sounds normal. No respiratory distress. She has no wheezes. She has no rales.   Abdominal: Soft. Bowel sounds are normal.  Musculoskeletal: She exhibits no edema.       No swelling in legs bilaterally.  Neurological: She is alert and oriented to person, place, and time.  Skin: Skin is warm. No rash noted. No erythema.  Psychiatric: She has a normal mood and affect. Her behavior is normal.    ED Course  Procedures (including critical care time) DIAGNOSTIC STUDIES: Oxygen Saturation is 100% on room air, normal by  my interpretation.    COORDINATION OF CARE: 11:23 PM- Patient informed of current plan for treatment and evaluation and agrees with plan at this time.      Labs Reviewed  URINALYSIS, ROUTINE W REFLEX MICROSCOPIC - Abnormal; Notable for the following:    Ketones, ur TRACE (*)     All other components within normal limits  CBC WITH DIFFERENTIAL - Abnormal; Notable for the following:    Hemoglobin 10.6 (*)     HCT 33.0 (*)     All other components within normal limits  BASIC METABOLIC PANEL - Abnormal; Notable for the following:    Glucose, Bld 115 (*)     All other components within normal limits  PREGNANCY, URINE   Ct Abdomen Pelvis W Contrast  11/13/2012  *RADIOLOGY REPORT*  Clinical Data: Heavy vaginal bleeding.  Dizziness.  Weakness. Abdominal pain.  Shortness of breath.  CT ABDOMEN AND PELVIS WITH CONTRAST  Technique:  Multidetector CT imaging of the abdomen and pelvis was performed following the standard protocol during bolus administration of intravenous contrast.  Contrast: OMNIPAQUE IOHEXOL 300 MG/ML  SOLN  Comparison: 06/14/2012  Findings: Dependent changes in the lung bases.  Surgical absence of the gallbladder.  The liver, spleen, pancreas, adrenal glands, kidneys, abdominal aorta, and retroperitoneal lymph nodes are unremarkable.  Postoperative changes consistent with gastric bypass surgery.  No small or large bowel distension.  No free air or free fluid in the abdomen.  Pelvis:  Intrauterine device.  Ovaries are not significantly  enlarged.  No free or loculated pelvic fluid collections.  No diverticulitis.  Appendix is normal.  Normal alignment of the lumbar vertebrae.  IMPRESSION: No acute process demonstrated in the abdomen or pelvis.  Stable appearance of postoperative changes.   Original Report Authenticated By: Burman Nieves, M.D.    Results for orders placed during the hospital encounter of 11/12/12  URINALYSIS, ROUTINE W REFLEX MICROSCOPIC      Component Value Range   Color, Urine YELLOW  YELLOW   APPearance CLEAR  CLEAR   Specific Gravity, Urine 1.015  1.005 - 1.030   pH 8.0  5.0 - 8.0   Glucose, UA NEGATIVE  NEGATIVE mg/dL   Hgb urine dipstick NEGATIVE  NEGATIVE   Bilirubin Urine NEGATIVE  NEGATIVE   Ketones, ur TRACE (*) NEGATIVE mg/dL   Protein, ur NEGATIVE  NEGATIVE mg/dL   Urobilinogen, UA 1.0  0.0 - 1.0 mg/dL   Nitrite NEGATIVE  NEGATIVE   Leukocytes, UA NEGATIVE  NEGATIVE  PREGNANCY, URINE      Component Value Range   Preg Test, Ur NEGATIVE  NEGATIVE  CBC WITH DIFFERENTIAL      Component Value Range   WBC 5.4  4.0 - 10.5 K/uL   RBC 4.04  3.87 - 5.11 MIL/uL   Hemoglobin 10.6 (*) 12.0 - 15.0 g/dL   HCT 86.5 (*) 78.4 - 69.6 %   MCV 81.7  78.0 - 100.0 fL   MCH 26.2  26.0 - 34.0 pg   MCHC 32.1  30.0 - 36.0 g/dL   RDW 29.5  28.4 - 13.2 %   Platelets 348  150 - 400 K/uL   Neutrophils Relative 57  43 - 77 %   Neutro Abs 3.1  1.7 - 7.7 K/uL   Lymphocytes Relative 35  12 - 46 %   Lymphs Abs 1.9  0.7 - 4.0 K/uL   Monocytes Relative 8  3 - 12 %   Monocytes Absolute 0.4  0.1 -  1.0 K/uL   Eosinophils Relative 0  0 - 5 %   Eosinophils Absolute 0.0  0.0 - 0.7 K/uL   Basophils Relative 0  0 - 1 %   Basophils Absolute 0.0  0.0 - 0.1 K/uL  BASIC METABOLIC PANEL      Component Value Range   Sodium 137  135 - 145 mEq/L   Potassium 3.8  3.5 - 5.1 mEq/L   Chloride 101  96 - 112 mEq/L   CO2 25  19 - 32 mEq/L   Glucose, Bld 115 (*) 70 - 99 mg/dL   BUN 13  6 - 23 mg/dL   Creatinine, Ser 4.54  0.50 -  1.10 mg/dL   Calcium 09.8  8.4 - 11.9 mg/dL   GFR calc non Af Amer >90  >90 mL/min   GFR calc Af Amer >90  >90 mL/min     1. Gastroenteritis       MDM  Suspect the viral gastroenteritis. Aware the patient does have a gastric bypass. Belly is nontender she's having some vomiting nausea and diarrhea no leukocytosis labs are normal feels better with fluids. Mild anemia noted in lead to followup of with Dr. Emelda Fear regarding the anemia and the vaginal bleeding. has also been followed by hematology in the past. Followup with them again.  No acute surgical abdomen is soft and nontender. Patient will return for any newer worse symptoms. Based on the vomiting and the diarrhea would be highly unlikely that this is a bowel obstruction.     I personally performed the services described in this documentation, which was scribed in my presence. The recorded information has been reviewed and is accurate.    Penny Jakes, MD 11/13/12 1478  Penny Jakes, MD 11/14/12 1102

## 2012-11-12 NOTE — ED Notes (Addendum)
Pt states that she has been having vaginal bleeding for the past month, states that she has an IUD.  States that she has spoken to her doctor about the issue prior.  States that she has to change a regular absorbency maxi pad about 3 times a day.  States that she has also had diarrhea, nausea and vomiting for the past 2 days and is feeling weak.  States that she felt like she was going to pass out at home earlier, states that she was seeing black spots around 9 pm.  Pt states that her doctor told her that her vaginal bleeding was not an emergency.  Pt is very soft spoken and reluctant to answer questions at times.

## 2012-11-13 ENCOUNTER — Emergency Department (HOSPITAL_COMMUNITY): Payer: BC Managed Care – PPO

## 2012-11-13 LAB — PREGNANCY, URINE: Preg Test, Ur: NEGATIVE

## 2012-11-13 LAB — URINALYSIS, ROUTINE W REFLEX MICROSCOPIC
Glucose, UA: NEGATIVE mg/dL
Hgb urine dipstick: NEGATIVE
Leukocytes, UA: NEGATIVE
Protein, ur: NEGATIVE mg/dL
Specific Gravity, Urine: 1.015 (ref 1.005–1.030)
pH: 8 (ref 5.0–8.0)

## 2012-11-13 MED ORDER — ONDANSETRON HCL 4 MG/2ML IJ SOLN
INTRAMUSCULAR | Status: AC
  Filled 2012-11-13: qty 2

## 2012-11-13 MED ORDER — ONDANSETRON 8 MG PO TBDP: 8.0000 mg | ORAL_TABLET | Freq: Three times a day (TID) | ORAL | Status: DC | PRN

## 2012-11-13 MED ORDER — LOPERAMIDE HCL 2 MG PO CAPS: 2.0000 mg | ORAL_CAPSULE | Freq: Four times a day (QID) | ORAL | Status: DC | PRN

## 2012-11-13 MED ORDER — PROMETHAZINE HCL 25 MG/ML IJ SOLN
12.5000 mg | Freq: Once | INTRAMUSCULAR | Status: AC
  Administered 2012-11-13: 12.5 mg via INTRAVENOUS
  Filled 2012-11-13: qty 1

## 2012-11-13 MED ORDER — SODIUM CHLORIDE 0.9 % IV BOLUS (SEPSIS)
1000.0000 mL | Freq: Once | INTRAVENOUS | Status: AC
  Administered 2012-11-13: 1000 mL via INTRAVENOUS

## 2012-11-13 MED ORDER — HYDROMORPHONE HCL 2 MG PO TABS: 2.0000 mg | ORAL_TABLET | ORAL | Status: DC | PRN

## 2012-11-13 MED ORDER — ONDANSETRON HCL 4 MG/2ML IJ SOLN
4.0000 mg | Freq: Once | INTRAMUSCULAR | Status: AC
  Administered 2012-11-13: 4 mg via INTRAVENOUS

## 2012-11-13 MED ORDER — IOHEXOL 300 MG/ML  SOLN
100.0000 mL | Freq: Once | INTRAMUSCULAR | Status: AC | PRN
  Administered 2012-11-13: 100 mL via INTRAVENOUS

## 2012-11-13 MED ORDER — HYDROMORPHONE HCL PF 1 MG/ML IJ SOLN: 1.0000 mg | Freq: Once | INTRAMUSCULAR | Status: DC

## 2012-11-13 NOTE — ED Notes (Signed)
Reports nausea has returned, MD notified.

## 2012-11-13 NOTE — ED Notes (Signed)
Reports nausea has returned, MD made aware, new orders received.

## 2013-03-23 ENCOUNTER — Other Ambulatory Visit: Payer: Self-pay | Admitting: Adult Health

## 2013-03-23 ENCOUNTER — Telehealth: Payer: Self-pay | Admitting: Family Medicine

## 2013-03-23 NOTE — Telephone Encounter (Signed)
Pt states that she had her marina out back in Jan, she started her cycle Wednesday evening. She is having excruciating abd and back pain, primarily on right side, where her ovary that has a cyst is located? Wants to know if we can call her in something for the pain, or at least just enough till she can get in to see Victorino Dike next week at [facility]. Due to her gastric bypass she is limited on her pain meds. She has also called her GYN but due to the Holiday the office is closed till Tues.  Gambell Apoth.

## 2013-03-23 NOTE — Telephone Encounter (Signed)
No. This is definitely not the kind of pt to call in narcotics for. None listed on chronic med sheet for ongoing refills

## 2013-03-23 NOTE — Telephone Encounter (Signed)
Patient notified

## 2013-03-23 NOTE — Telephone Encounter (Signed)
See red sheet in chart- also got rx for #20 on 02/26/13

## 2013-04-03 ENCOUNTER — Telehealth: Payer: Self-pay | Admitting: Family Medicine

## 2013-04-03 NOTE — Telephone Encounter (Signed)
Refill times one, will need follow up at end of May

## 2013-04-03 NOTE — Telephone Encounter (Signed)
Refill, adderall 30mg 

## 2013-04-04 NOTE — Telephone Encounter (Signed)
adderall xr 30 mg # 30 script ready for pickup pt notified and needs follow up at the end of May

## 2013-04-10 ENCOUNTER — Emergency Department (HOSPITAL_COMMUNITY)
Admission: EM | Admit: 2013-04-10 | Discharge: 2013-04-10 | Disposition: A | Discharge status: Home / Self Care | Payer: 59 | Attending: Emergency Medicine | Admitting: Emergency Medicine

## 2013-04-10 ENCOUNTER — Encounter (HOSPITAL_COMMUNITY): Payer: Self-pay | Admitting: *Deleted

## 2013-04-10 DIAGNOSIS — F17200 Nicotine dependence, unspecified, uncomplicated: Secondary | ICD-10-CM | POA: Insufficient documentation

## 2013-04-10 DIAGNOSIS — Z87448 Personal history of other diseases of urinary system: Secondary | ICD-10-CM | POA: Insufficient documentation

## 2013-04-10 DIAGNOSIS — Z8659 Personal history of other mental and behavioral disorders: Secondary | ICD-10-CM | POA: Insufficient documentation

## 2013-04-10 DIAGNOSIS — G43909 Migraine, unspecified, not intractable, without status migrainosus: Secondary | ICD-10-CM | POA: Insufficient documentation

## 2013-04-10 DIAGNOSIS — Z862 Personal history of diseases of the blood and blood-forming organs and certain disorders involving the immune mechanism: Secondary | ICD-10-CM | POA: Insufficient documentation

## 2013-04-10 DIAGNOSIS — Z79899 Other long term (current) drug therapy: Secondary | ICD-10-CM | POA: Insufficient documentation

## 2013-04-10 DIAGNOSIS — Z8742 Personal history of other diseases of the female genital tract: Secondary | ICD-10-CM | POA: Insufficient documentation

## 2013-04-10 DIAGNOSIS — Z8719 Personal history of other diseases of the digestive system: Secondary | ICD-10-CM | POA: Insufficient documentation

## 2013-04-10 DIAGNOSIS — E669 Obesity, unspecified: Secondary | ICD-10-CM | POA: Insufficient documentation

## 2013-04-10 MED ORDER — ONDANSETRON HCL 4 MG/5ML PO SOLN
4.0000 mg | Freq: Once | ORAL | Status: AC
  Administered 2013-04-10: 4 mg via ORAL
  Filled 2013-04-10 (×2): qty 1

## 2013-04-10 MED ORDER — PROMETHAZINE HCL 25 MG/ML IJ SOLN
12.5000 mg | Freq: Once | INTRAMUSCULAR | Status: AC
  Administered 2013-04-10: 12.5 mg via INTRAVENOUS
  Filled 2013-04-10: qty 1

## 2013-04-10 MED ORDER — METHOCARBAMOL 100 MG/ML IJ SOLN
1000.0000 mg | Freq: Once | INTRAVENOUS | Status: AC
  Administered 2013-04-10: 1000 mg via INTRAVENOUS
  Filled 2013-04-10: qty 10

## 2013-04-10 MED ORDER — METHOCARBAMOL 500 MG PO TABS: 1000.0000 mg | ORAL_TABLET | Freq: Once | ORAL | Status: DC

## 2013-04-10 MED ORDER — HYDROCODONE-ACETAMINOPHEN 5-325 MG PO TABS: ORAL_TABLET | ORAL | Status: DC

## 2013-04-10 MED ORDER — PROMETHAZINE HCL 25 MG PO TABS: 25.0000 mg | ORAL_TABLET | Freq: Four times a day (QID) | ORAL | Status: DC | PRN

## 2013-04-10 MED ORDER — FENTANYL CITRATE 0.05 MG/ML IJ SOLN
100.0000 ug | Freq: Once | INTRAMUSCULAR | Status: AC
  Administered 2013-04-10: 100 ug via INTRAVENOUS
  Filled 2013-04-10: qty 2

## 2013-04-10 MED ORDER — FENTANYL CITRATE 0.05 MG/ML IJ SOLN
50.0000 ug | Freq: Once | INTRAMUSCULAR | Status: AC
  Administered 2013-04-10: 50 ug via INTRAVENOUS
  Filled 2013-04-10: qty 2

## 2013-04-10 NOTE — ED Notes (Signed)
Pt complains of right sided migraine headache for 1 week, states it has been progressing over the last week, pt notes nausea and photosensitivity. Pt is AOx4 at current. No other complaints. States history of migraines but has not had one in months.

## 2013-04-10 NOTE — ED Provider Notes (Signed)
History     CSN: [id number]  Arrival date & time 04/10/13  1528   First MD Initiated Contact with Patient 04/10/13 1604      Chief Complaint  Patient presents with  . Headache    (Consider location/radiation/quality/duration/timing/severity/associated sxs/prior treatment) Patient is a 27 y.o. female presenting with headaches. The history is provided by the patient.  Headache Pain location:  R temporal and R parietal Quality:  Sharp Radiates to:  L neck and R neck Severity currently:  7/10 Onset quality:  Gradual Duration:  1 week Timing:  Intermittent Progression:  Unchanged Chronicity:  Recurrent Similar to prior headaches: yes   Context: emotional stress   Relieved by:  Nothing Worsened by:  Nothing tried Ineffective treatments:  Cold packs and resting in a darkened room Associated symptoms: no abdominal pain, no back pain, no cough, no dizziness, no fever, no focal weakness, no neck pain, no photophobia and no seizures     Past Medical History  Diagnosis Date  . Migraines   . Interstitial cystitis   . IUD FEB 2010  . Gastritis JULY 2011  . Depression   . 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 Fatty liver   . Obesity (BMI 30-39.9) 2011 228 LBS BMI 36.8  . Iron deficiency anemia 07/23/2010  . Irritable bowel syndrome 2012 DIARRHEA    JUN 2012 TTG IgA 14.9  . Elevated liver enzymes JUL 2011ALK PHOS 111-127 AST  143-267 ALT  213-321T BILI 0.6  ALB  3.7-4.06 Jun 2011 ALK PHOS 118 AST 24 ALT 42 T BILI 0.4 ALB 3.9  . Chronic daily headache   . Ovarian cyst   . PONV (postoperative nausea and vomiting)     Past Surgical History  Procedure Laterality Date  . Gab  2007    in [place]-POUCH 5 CM  . Cholecystectomy  2005    biliary dyskinesia  . Cath self every nite      for sodium bicarb injection (discontinued 2013)  . Colonoscopy  JUN 2012 ABD PN/DIARRHEA WITH PROPOFOL    NL COLON  . Upper gastrointestinal endoscopy  JULY 2011 NAUSEA-D125,V6, PH 25    Bx;  GASTRITIS, POUCH-5 CM LONG  . Dilation and curettage of uterus    . Tonsillectomy    . Gastric bypass  06/2006  . Savory dilation  06/20/2012    Procedure: SAVORY DILATION;  Surgeon: West Bali, MD;  Location: [facility];  Service: Endoscopy;  Laterality: N/A;  16 savory    Family History  Problem Relation Age of Onset  . Hemochromatosis Maternal Grandmother   . Migraines Maternal Grandmother   . Cancer Maternal Grandmother   . Hypertension Father   . Diabetes Father   . Coronary artery disease Father   . Migraines Paternal Grandmother   . Cancer Mother     breast  . Coronary artery disease Paternal Grandfather   . Hemochromatosis Mother     History  Substance Use Topics  . Smoking status: Current Every Day Smoker -- 0.50 packs/day for 5 years    Types: Cigarettes  . Smokeless tobacco: Never Used  . Alcohol Use: Yes     Comment: occasionally    OB History   Grav Para Term Preterm Abortions TAB SAB Ect Mult Living   2 1 1  1  1   1       Review of Systems  Constitutional: Negative for fever and activity change.  All ROS Neg except as noted in HPI  HENT: Negative for nosebleeds and neck pain.   Eyes: Negative for photophobia and discharge.  Respiratory: Negative for cough, shortness of breath and wheezing.   Cardiovascular: Negative for chest pain and palpitations.  Gastrointestinal: Negative for abdominal pain and blood in stool.  Genitourinary: Negative for dysuria, frequency and hematuria.  Musculoskeletal: Negative for back pain and arthralgias.  Skin: Negative.   Neurological: Positive for headaches. Negative for dizziness, focal weakness, seizures and speech difficulty.  Psychiatric/Behavioral: Negative for hallucinations, confusion and sleep disturbance.    Allergies  Codeine; Gabapentin; Metoclopramide hcl; Latex; and Tape  Home Medications   Current Outpatient Rx  Name  Route  Sig  Dispense  Refill  . amphetamine-dextroamphetamine (ADDERALL XR) 30  MG 24 hr capsule   Oral   Take 30 mg by mouth daily.         Marland Kitchen ibuprofen (ADVIL,MOTRIN) 200 MG tablet   Oral   Take 800 mg by mouth every 6 (six) hours as needed for pain.          . rizatriptan (MAXALT) 10 MG tablet   Oral   Take 10 mg by mouth as needed for migraine. May repeat in 2 hours if needed           BP 134/83  Pulse 88  Temp(Src) 98 F (36.7 C) (Oral)  Resp 18  SpO2 100%  LMP 03/22/2013  Physical Exam  Nursing note and vitals reviewed. Constitutional: She is oriented to person, place, and time. She appears well-developed and well-nourished.  Non-toxic appearance.  HENT:  Head: Normocephalic.  Right Ear: Tympanic membrane and external ear normal.  Left Ear: Tympanic membrane and external ear normal.  Eyes: EOM and lids are normal. Pupils are equal, round, and reactive to light.  Neck: Normal range of motion. Neck supple. Carotid bruit is not present.  Cardiovascular: Normal rate, regular rhythm, normal heart sounds, intact distal pulses and normal pulses.   Pulmonary/Chest: Breath sounds normal. No respiratory distress.  Abdominal: Soft. Bowel sounds are normal. There is no tenderness. There is no guarding.  Musculoskeletal: Normal range of motion.  Lymphadenopathy:       Head (right side): No submandibular adenopathy present.       Head (left side): No submandibular adenopathy present.    She has no cervical adenopathy.  Neurological: She is alert and oriented to person, place, and time. She has normal strength. No cranial nerve deficit or sensory deficit. She exhibits normal muscle tone. Coordination normal.  Skin: Skin is warm and dry.  Psychiatric: She has a normal mood and affect. Her speech is normal.    ED Course  Procedures (including critical care time)  Labs Reviewed - No data to display No results found.   No diagnosis found.    MDM  I have reviewed nursing notes, vital signs, and all appropriate lab and imaging results for this  patient. Pt has hx of headaches. This headache also involves tightness and soreness in the neck. Injection for pain offered. Pt request iv meds instead.  Pt moved to ACUTE aea.  6:27p Pt states that she has received little if any relief from the fentanyl and robaxin. Additional fentanyl ordered. 7:27 PM. After second dose of fentanyl, patient states she feels much better. She now feels she can handle the headache at home. No gross neurologic deficits, no changes in neurologic examination.  Prescription for Norco 5 mg and promethazine 25 mg given to the  patient. Patient is to see her primary physician this week for reevaluation of her maintenance medication for migraines.     Kathie Dike, PA-C 04/10/13 [phone]

## 2013-04-10 NOTE — ED Notes (Signed)
Headache for 1 week, no n/v, Post neck pain.  No fever or chills.  No HI.  Pt says feels like migraines in past.

## 2013-04-10 NOTE — ED Provider Notes (Signed)
Medical screening examination/treatment/procedure(s) were performed by non-physician practitioner and as supervising physician I was immediately available for consultation/collaboration.  Geoffery Lyons, MD 04/10/13 2217

## 2013-04-16 ENCOUNTER — Ambulatory Visit: Payer: Self-pay | Admitting: Family Medicine

## 2013-04-17 ENCOUNTER — Ambulatory Visit (INDEPENDENT_AMBULATORY_CARE_PROVIDER_SITE_OTHER): Payer: 59 | Admitting: Family Medicine

## 2013-04-17 VITALS — BP 116/68 | Wt 186.0 lb

## 2013-04-17 DIAGNOSIS — Z8669 Personal history of other diseases of the nervous system and sense organs: Secondary | ICD-10-CM

## 2013-04-17 DIAGNOSIS — F410 Panic disorder [episodic paroxysmal anxiety] without agoraphobia: Secondary | ICD-10-CM

## 2013-04-17 DIAGNOSIS — F1120 Opioid dependence, uncomplicated: Secondary | ICD-10-CM

## 2013-04-17 MED ORDER — AMPHETAMINE-DEXTROAMPHET ER 30 MG PO CP24: 30.0000 mg | ORAL_CAPSULE | Freq: Every day | ORAL | Status: DC

## 2013-04-17 MED ORDER — CLONAZEPAM 0.5 MG PO TABS: 0.5000 mg | ORAL_TABLET | Freq: Two times a day (BID) | ORAL | Status: DC | PRN

## 2013-04-17 MED ORDER — NORTRIPTYLINE HCL 25 MG PO CAPS: ORAL_CAPSULE | ORAL | Status: DC

## 2013-04-17 NOTE — Progress Notes (Signed)
  Subjective:    Patient ID: Penny [name], female    DOB: Sep 12, 1986, 27 y.o.   MRN: [id number]  HPIPatient with severe migraines sometimes they come to where they're several per week other times as once every few days occasionally she has to take pain medication she does not 1 use pain medicine on regular basis because of a personal history of drug/pain medication overuse. She also relates a lot of stress depression anxiety. She is very stressed about her parents they are divorcing after many years. She relates her relationship with her husband is fairly good. She also states that she is doing well with work. Past medical history benign family history  Review of Systems  benignSee above patient is not suicidal.    Objective:   Physical Exam Neck no masses lungs are clear no crackles heart is regular pulse normal abdomen soft extremities no edema skin warm dry neurologic grossly normal       Assessment & Plan:  #1 frequent migraines-we will try nortriptyline gradually increase to 75 mg daily preferably at bedtime to help her with reducing the migraines and may potentially also help anxiety issues as well as panic attacks and depression issues over some interpersonal problems with her parents possibly splitting up. Patient not suicidal or homicidal. #2 anxiety/panic attacks Xanax 0.5 mg twice a day when necessary for infrequent use cautioned drowsiness. Patient will followup in approximately 3 months.

## 2013-04-17 NOTE — Patient Instructions (Addendum)
nortryptiline 25mg  one at bedtime for 7 days then gradually then increase to 2 tabs then toward 3 tabs/day. Goal is to decrease migraines. Call if problems

## 2013-04-19 ENCOUNTER — Other Ambulatory Visit: Payer: Self-pay | Admitting: Adult Health

## 2013-04-19 ENCOUNTER — Other Ambulatory Visit: Payer: Self-pay | Admitting: Family Medicine

## 2013-04-19 NOTE — Telephone Encounter (Signed)
1 refilltotal, please write on prescription  to last at least 30 days

## 2013-04-27 IMAGING — CT CT ABD-PELV W/ CM
2 of 3 series · 15 of 46 positions shown, 17 images · IV contrast (Omnipaque 300)
Comparison: CT of the abdomen and pelvis performed 06/02/2006, and
MRI of the abdomen performed 07/01/2010

CLINICAL DATA: Right lower quadrant abdominal pain and nausea.

CT ABDOMEN AND PELVIS WITH CONTRAST
TECHNIQUE: Multidetector CT imaging of the abdomen and pelvis was
performed following the standard protocol during bolus
administration of intravenous contrast.
Contrast: 100 mL of Omnipaque 300 IV contrast

[Series 2: abd_pel_with 5.0 b40f · axial · 0.75mm/px · z∈[+178,+608]mm · 12 of 100 slices shown, 14 images]
[im 7/100  soft-tissue]
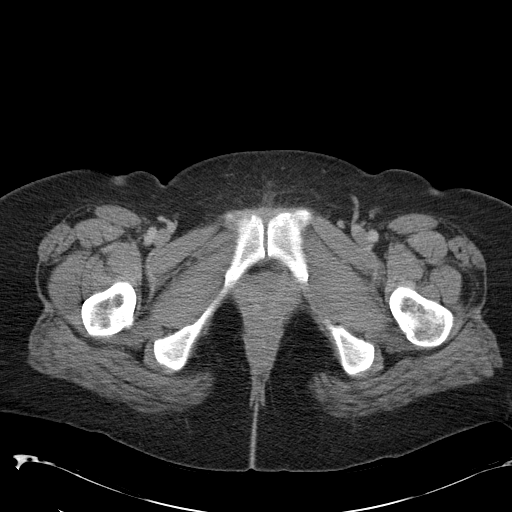
[im 7/100  bone]
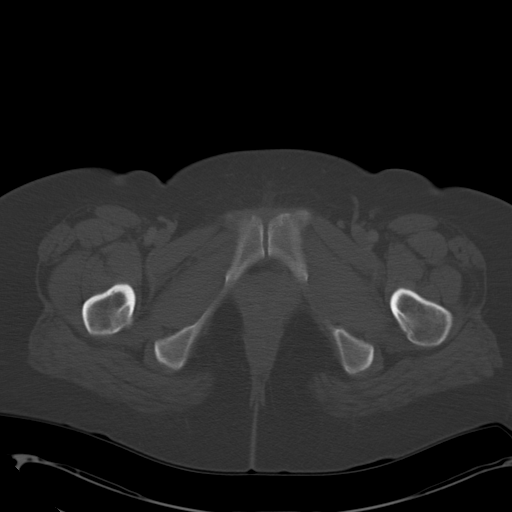
[im 13/100  soft-tissue]
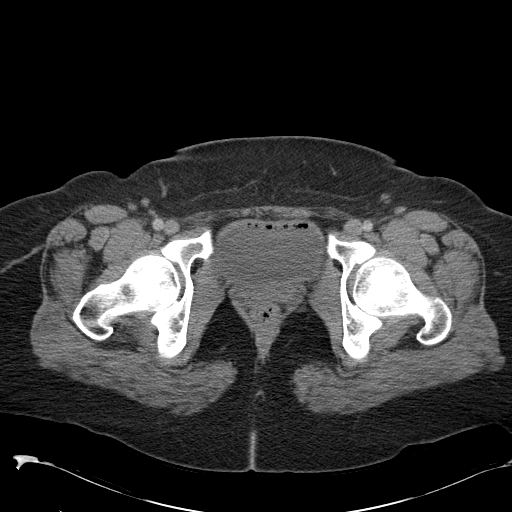
[im 23/100  soft-tissue]
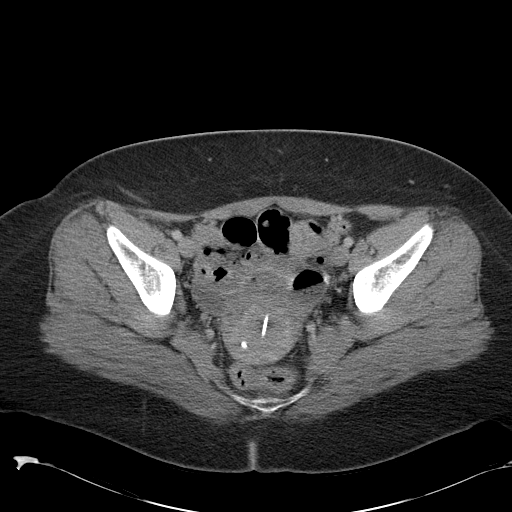
[im 29/100  soft-tissue]
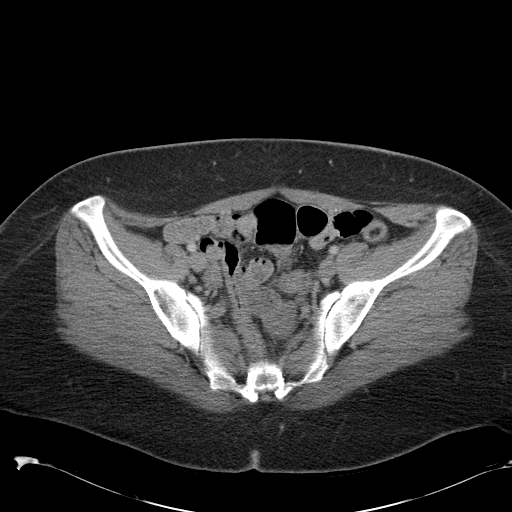
[im 39/100  soft-tissue]
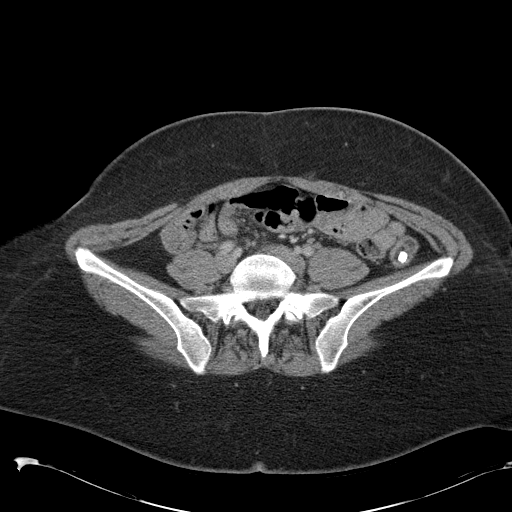
[im 45/100  soft-tissue]
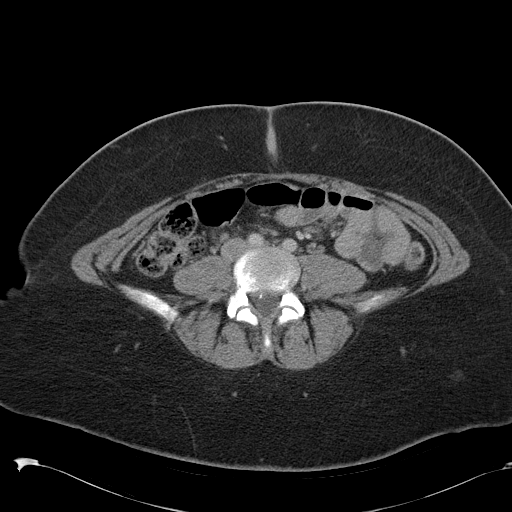
[im 55/100  soft-tissue]
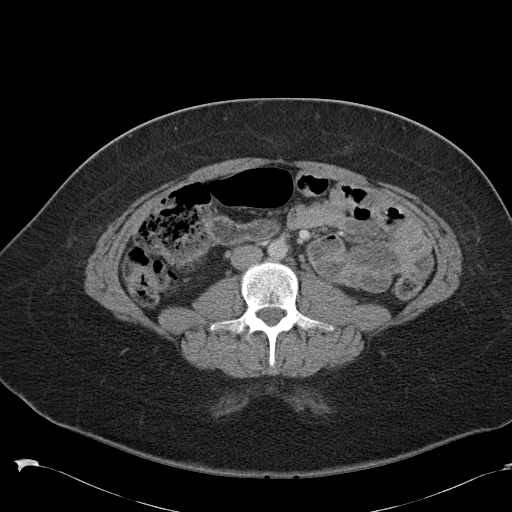
[im 61/100  soft-tissue]
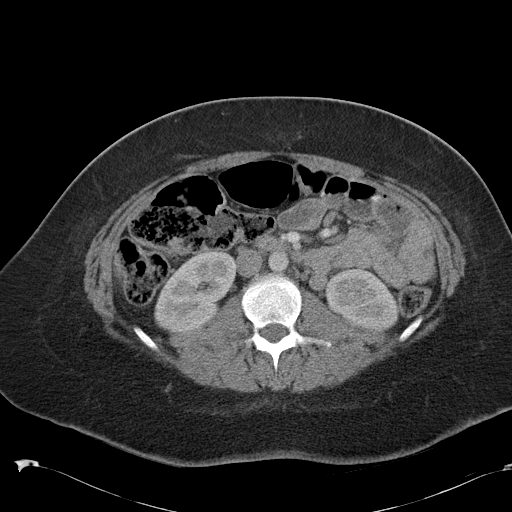
[im 71/100  soft-tissue]
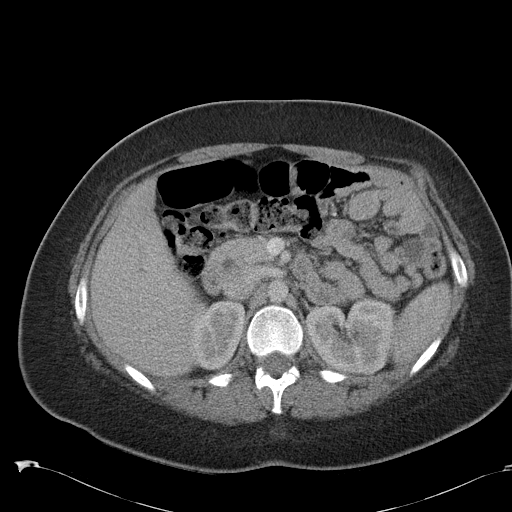
[im 71/100  bone]
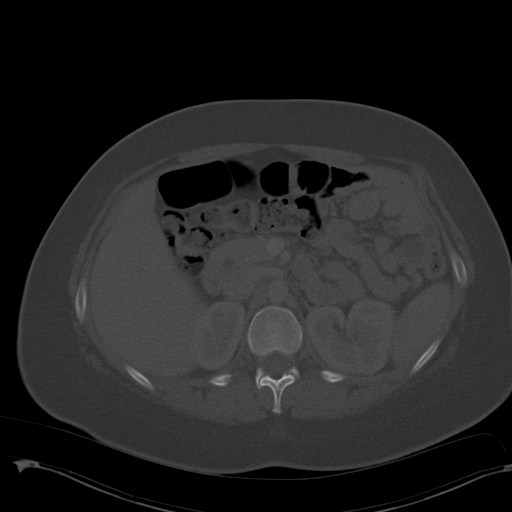
[im 77/100  soft-tissue]
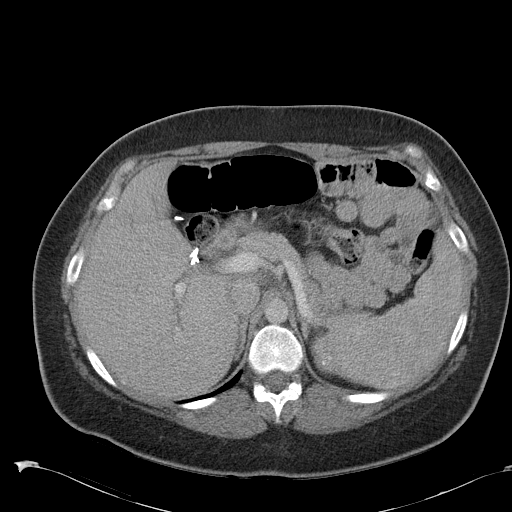
[im 87/100  soft-tissue]
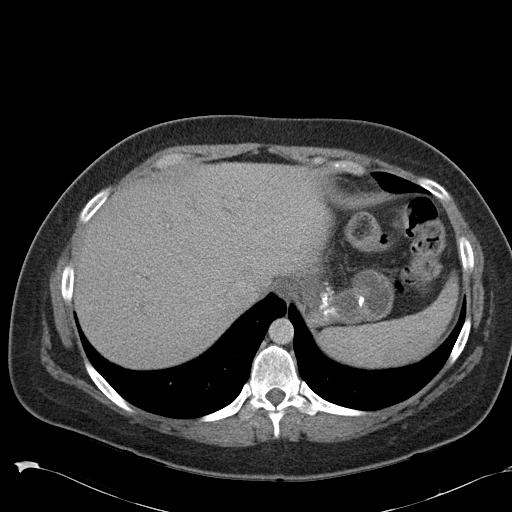
[im 93/100  soft-tissue]
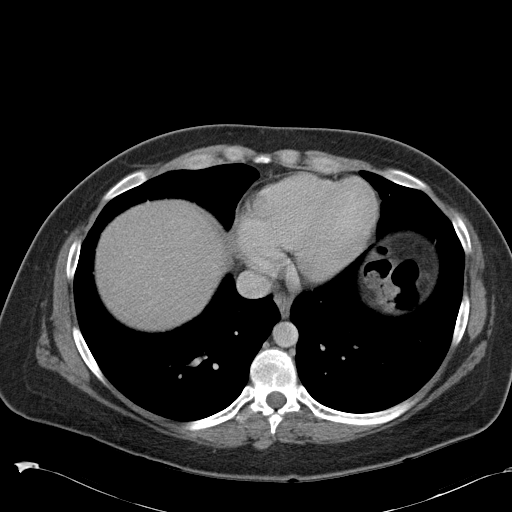

[Series 4: mpr cor post contrast (id) · coronal · 0.85mm/px · 3 of 115 slices shown]
[im 39/115  soft-tissue]
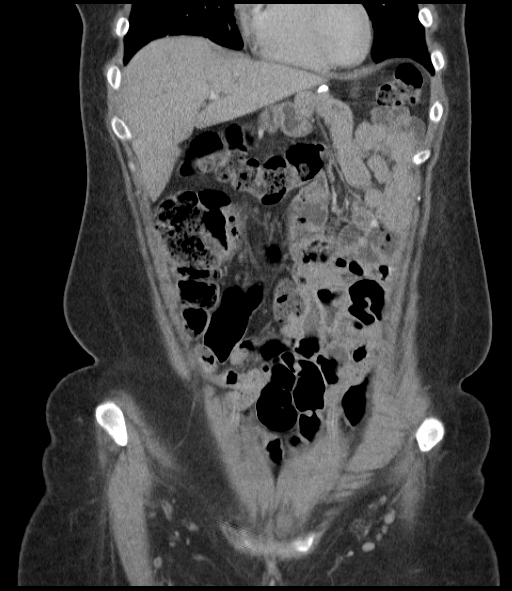
[im 51/115  soft-tissue]
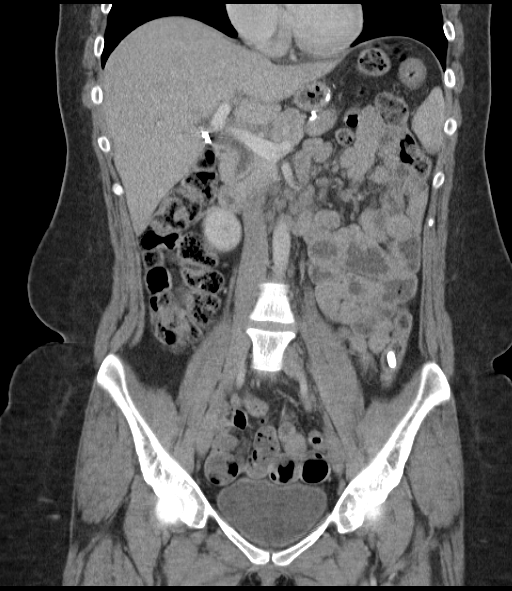
[im 64/115  soft-tissue]
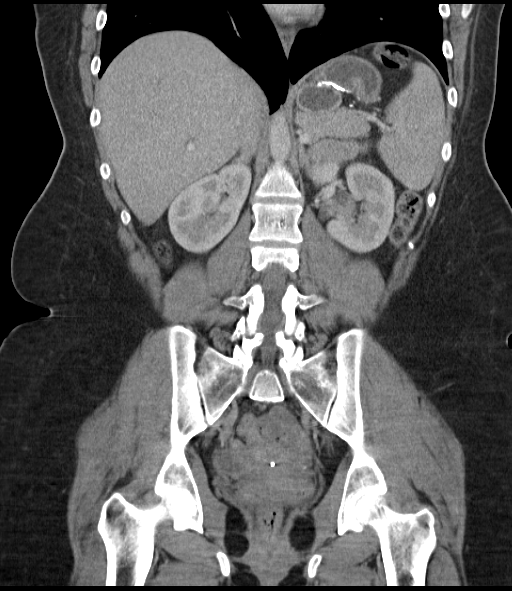

[15 of 46 positions shown; findings below may reference images not displayed]

FINDINGS: The visualized lung bases are clear.

There is mild prominence of intrahepatic biliary ducts, within
normal limits status post cholecystectomy.  Clips are noted at the
gallbladder fossa.  The spleen is enlarged, measuring 14.6 cm in
length.

There is mild hazy stranding about the pancreatic head, which could
reflect mild pancreatitis; the remainder of the pancreas is
unremarkable in appearance.  There is no evidence of pseudocyst or
abscess formation.  Suggest correlation with laboratory findings.

The adrenal glands are unremarkable in appearance.  The kidneys are
within normal limits bilaterally; no hydronephrosis or perinephric
stranding is seen.  No renal or ureteral stones are identified.

No free fluid is identified.  The small bowel is unremarkable in
appearance.  Postoperative change about the stomach reflects prior
gastric bypass; there is no evidence for obstruction.  The stomach
is otherwise unremarkable in appearance.  No acute vascular
abnormalities are seen.

The appendix is noted at the midline, and is filled with air and
normal in caliber, without evidence for appendicitis.  The cecum is
flipped anteriorly; the colon is partially filled with stool.  The
sigmoid colon is significantly redundant.  High-density tablets are
noted at the cecum and descending colon; the colon is unremarkable
in appearance.

The bladder is mildly distended; scattered air is seen anteriorly
within the bladder, raising question for emphysematous cystitis.
Suggest clinical correlation for prior Foley catheter placement.
The uterus is grossly unremarkable in appearance; intrauterine
device is noted in expected position at the fundus.  The ovaries
are relatively seen; no suspicious adnexal masses are seen.  No
inguinal lymphadenopathy is seen.

No acute osseous abnormalities are identified.
IMPRESSION: 1.  Mild hazy stranding about the pancreatic head could reflect
mild pancreatitis; no evidence for pseudocyst or abscess formation.
Suggest clinical correlation with laboratory findings.
2.  No evidence for appendicitis; appendix unremarkable in
appearance.
3.  Scattered air noted anteriorly within the bladder; the pattern
of air raises question for emphysematous cystitis.  Suggest
clinical correlation for prior Foley catheter placement.
4.  High-density tablets incidentally noted at the cecum and
descending colon; colon otherwise unremarkable in appearance.
5.  Splenomegaly again noted.

## 2013-05-03 ENCOUNTER — Ambulatory Visit (INDEPENDENT_AMBULATORY_CARE_PROVIDER_SITE_OTHER): Payer: 59 | Admitting: Obstetrics & Gynecology

## 2013-05-03 ENCOUNTER — Telehealth: Payer: Self-pay | Admitting: *Deleted

## 2013-05-03 ENCOUNTER — Ambulatory Visit: Payer: 59 | Admitting: Obstetrics & Gynecology

## 2013-05-03 ENCOUNTER — Encounter: Payer: Self-pay | Admitting: Obstetrics & Gynecology

## 2013-05-03 VITALS — BP 100/60 | Ht 66.0 in | Wt 185.0 lb

## 2013-05-03 DIAGNOSIS — R1013 Epigastric pain: Secondary | ICD-10-CM

## 2013-05-03 NOTE — Progress Notes (Signed)
Patient ID: Penny [name], female   DOB: August 30, 1986, 27 y.o.   MRN: [id number] Patient with sudden onset of epigastric pain and then diarrhea, numerous, >10. History of laparoscopic gastric bypass  No emesis  Exam No distention Soft No masses Tender in the epigastrum and right pericolic gutter  Talked with Dr Cheryll Cockayne and instructed patient to go to ER if worsens pain or emesis or hematemesis  This is not a gyn problem and patient understands

## 2013-05-03 NOTE — Telephone Encounter (Signed)
Abdominal pain increasing through out the night, "to the point of doubling pt over", c/o nausea. Pt states has hx of ovarian cyst. Transferred call to front staff to schedule an appt for today.

## 2013-05-04 ENCOUNTER — Emergency Department (HOSPITAL_COMMUNITY)
Admission: EM | Admit: 2013-05-04 | Discharge: 2013-05-04 | Disposition: A | Discharge status: Home / Self Care | Payer: 59 | Attending: Emergency Medicine | Admitting: Emergency Medicine

## 2013-05-04 ENCOUNTER — Encounter (HOSPITAL_COMMUNITY): Payer: Self-pay

## 2013-05-04 ENCOUNTER — Emergency Department (HOSPITAL_COMMUNITY): Payer: 59

## 2013-05-04 DIAGNOSIS — Z9089 Acquired absence of other organs: Secondary | ICD-10-CM | POA: Insufficient documentation

## 2013-05-04 DIAGNOSIS — Z9889 Other specified postprocedural states: Secondary | ICD-10-CM | POA: Insufficient documentation

## 2013-05-04 DIAGNOSIS — Z79899 Other long term (current) drug therapy: Secondary | ICD-10-CM | POA: Insufficient documentation

## 2013-05-04 DIAGNOSIS — R112 Nausea with vomiting, unspecified: Secondary | ICD-10-CM | POA: Insufficient documentation

## 2013-05-04 DIAGNOSIS — R109 Unspecified abdominal pain: Secondary | ICD-10-CM | POA: Insufficient documentation

## 2013-05-04 DIAGNOSIS — F3289 Other specified depressive episodes: Secondary | ICD-10-CM | POA: Insufficient documentation

## 2013-05-04 DIAGNOSIS — Z8742 Personal history of other diseases of the female genital tract: Secondary | ICD-10-CM | POA: Insufficient documentation

## 2013-05-04 DIAGNOSIS — F17200 Nicotine dependence, unspecified, uncomplicated: Secondary | ICD-10-CM | POA: Insufficient documentation

## 2013-05-04 DIAGNOSIS — D509 Iron deficiency anemia, unspecified: Secondary | ICD-10-CM | POA: Insufficient documentation

## 2013-05-04 DIAGNOSIS — F329 Major depressive disorder, single episode, unspecified: Secondary | ICD-10-CM | POA: Insufficient documentation

## 2013-05-04 DIAGNOSIS — Z91040 Latex allergy status: Secondary | ICD-10-CM | POA: Insufficient documentation

## 2013-05-04 DIAGNOSIS — E669 Obesity, unspecified: Secondary | ICD-10-CM | POA: Insufficient documentation

## 2013-05-04 DIAGNOSIS — Z8719 Personal history of other diseases of the digestive system: Secondary | ICD-10-CM | POA: Insufficient documentation

## 2013-05-04 DIAGNOSIS — Z9884 Bariatric surgery status: Secondary | ICD-10-CM | POA: Insufficient documentation

## 2013-05-04 DIAGNOSIS — Z975 Presence of (intrauterine) contraceptive device: Secondary | ICD-10-CM | POA: Insufficient documentation

## 2013-05-04 DIAGNOSIS — Z87448 Personal history of other diseases of urinary system: Secondary | ICD-10-CM | POA: Insufficient documentation

## 2013-05-04 DIAGNOSIS — R51 Headache: Secondary | ICD-10-CM | POA: Insufficient documentation

## 2013-05-04 DIAGNOSIS — Z3202 Encounter for pregnancy test, result negative: Secondary | ICD-10-CM | POA: Insufficient documentation

## 2013-05-04 DIAGNOSIS — G8929 Other chronic pain: Secondary | ICD-10-CM | POA: Insufficient documentation

## 2013-05-04 DIAGNOSIS — G43909 Migraine, unspecified, not intractable, without status migrainosus: Secondary | ICD-10-CM | POA: Insufficient documentation

## 2013-05-04 DIAGNOSIS — R197 Diarrhea, unspecified: Secondary | ICD-10-CM | POA: Insufficient documentation

## 2013-05-04 LAB — LACTATE DEHYDROGENASE: LDH: 180 U/L (ref 94–250)

## 2013-05-04 LAB — HEPATIC FUNCTION PANEL
ALT: 53 U/L — ABNORMAL HIGH (ref 0–35)
Indirect Bilirubin: 0.2 mg/dL — ABNORMAL LOW (ref 0.3–0.9)
Total Protein: 6.9 g/dL (ref 6.0–8.3)

## 2013-05-04 LAB — BASIC METABOLIC PANEL
BUN: 10 mg/dL (ref 6–23)
Chloride: 99 mEq/L (ref 96–112)
GFR calc Af Amer: >90 mL/min (ref >90)
Potassium: 5.2 mEq/L — ABNORMAL HIGH (ref 3.5–5.1)

## 2013-05-04 LAB — CBC WITH DIFFERENTIAL/PLATELET
Eosinophils Absolute: 0.1 10*3/uL (ref 0.0–0.7)
Lymphocytes Relative: 34 % (ref 12–46)
Lymphs Abs: 2.1 10*3/uL (ref 0.7–4.0)
Neutro Abs: 3.2 10*3/uL (ref 1.7–7.7)
Neutrophils Relative %: 52 % (ref 43–77)
Platelets: 276 10*3/uL (ref 150–400)
RBC: 3.47 MIL/uL — ABNORMAL LOW (ref 3.87–5.11)
WBC: 6.1 10*3/uL (ref 4.0–10.5)

## 2013-05-04 LAB — URINALYSIS, ROUTINE W REFLEX MICROSCOPIC
Leukocytes, UA: NEGATIVE
Nitrite: NEGATIVE
Specific Gravity, Urine: 1.028 (ref 1.005–1.030)
pH: 6.5 (ref 5.0–8.0)

## 2013-05-04 LAB — CG4 I-STAT (LACTIC ACID): Lactic Acid, Venous: 0.42 mmol/L — ABNORMAL LOW (ref 0.5–2.2)

## 2013-05-04 LAB — LIPASE, BLOOD: Lipase: 31 U/L (ref 11–59)

## 2013-05-04 MED ORDER — DIPHENHYDRAMINE HCL 50 MG/ML IJ SOLN: 25.0000 mg | Freq: Once | INTRAMUSCULAR | Status: AC

## 2013-05-04 MED ORDER — MORPHINE SULFATE 4 MG/ML IJ SOLN
4.0000 mg | Freq: Once | INTRAMUSCULAR | Status: AC
  Administered 2013-05-04: 4 mg via INTRAVENOUS
  Filled 2013-05-04: qty 1

## 2013-05-04 MED ORDER — HYDROMORPHONE HCL PF 1 MG/ML IJ SOLN
1.0000 mg | Freq: Once | INTRAMUSCULAR | Status: AC
  Administered 2013-05-04: 1 mg via INTRAVENOUS
  Filled 2013-05-04: qty 1

## 2013-05-04 MED ORDER — DIPHENHYDRAMINE HCL 50 MG/ML IJ SOLN
INTRAMUSCULAR | Status: AC
  Administered 2013-05-04: 25 mg via INTRAVENOUS
  Filled 2013-05-04: qty 1

## 2013-05-04 MED ORDER — DIPHENHYDRAMINE HCL 25 MG PO CAPS: 25.0000 mg | ORAL_CAPSULE | Freq: Once | ORAL | Status: DC

## 2013-05-04 MED ORDER — ONDANSETRON 4 MG PO TBDP: 4.0000 mg | ORAL_TABLET | Freq: Three times a day (TID) | ORAL | Status: DC | PRN

## 2013-05-04 MED ORDER — OXYCODONE-ACETAMINOPHEN 5-325 MG PO TABS: 2.0000 | ORAL_TABLET | ORAL | Status: DC | PRN

## 2013-05-04 MED ORDER — ONDANSETRON HCL 4 MG/2ML IJ SOLN
4.0000 mg | Freq: Once | INTRAMUSCULAR | Status: AC
  Administered 2013-05-04: 4 mg via INTRAVENOUS
  Filled 2013-05-04: qty 2

## 2013-05-04 MED ORDER — SODIUM CHLORIDE 0.9 % IV SOLN
Freq: Once | INTRAVENOUS | Status: AC
  Administered 2013-05-04: 07:00:00 via INTRAVENOUS

## 2013-05-04 NOTE — ED Provider Notes (Signed)
History     CSN: [id number]  Arrival date & time 05/04/13  0527   First MD Initiated Contact with Patient 05/04/13 [phone]      Chief Complaint  Patient presents with  . Abdominal Pain    (Consider location/radiation/quality/duration/timing/severity/associated sxs/prior treatment) HPI Comments: Patient is a 27 year old female with a past medical history of previous gastric bypass surgery, interstitial cystitis, and ovarian cysts who presents with a 2 day history of abdominal pain. The pain is located in her RUQ and does radiate down to her RLQ. The pain is described as cramping and severe. The pain started gradually and progressively worsened since the onset. No alleviating/aggravating factors. The patient has tried hydrocodone for symptoms without relief. Associated symptoms include NVD. Patient denies fever, headache, chest pain, SOB, dysuria, constipation, abnormal vaginal bleeding/discharge. LMP 2 weeks ago. Patient reports seeing her OBGYN yesterday because she thought the pain may be related to her ovarian cysts but it was not.      Past Medical History  Diagnosis Date  . Migraines   . Interstitial cystitis   . IUD FEB 2010  . Gastritis JULY 2011  . Depression   . 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 Fatty liver   . Obesity (BMI 30-39.9) 2011 228 LBS BMI 36.8  . Iron deficiency anemia 07/23/2010  . Irritable bowel syndrome 2012 DIARRHEA    JUN 2012 TTG IgA 14.9  . Elevated liver enzymes JUL 2011ALK PHOS 111-127 AST  143-267 ALT  213-321T BILI 0.6  ALB  3.7-4.06 Jun 2011 ALK PHOS 118 AST 24 ALT 42 T BILI 0.4 ALB 3.9  . Chronic daily headache   . Ovarian cyst   . PONV (postoperative nausea and vomiting)     Past Surgical History  Procedure Laterality Date  . Gab  2007    in [place]-POUCH 5 CM  . Cholecystectomy  2005    biliary dyskinesia  . Cath self every nite      for sodium bicarb injection (discontinued 2013)  . Colonoscopy  JUN 2012 ABD PN/DIARRHEA WITH  PROPOFOL    NL COLON  . Upper gastrointestinal endoscopy  JULY 2011 NAUSEA-D125,V6, PH 25    Bx; GASTRITIS, POUCH-5 CM LONG  . Dilation and curettage of uterus    . Tonsillectomy    . Gastric bypass  06/2006  . Savory dilation  06/20/2012    Procedure: SAVORY DILATION;  Surgeon: West Bali, MD;  Location: [facility];  Service: Endoscopy;  Laterality: N/A;  16 savory    Family History  Problem Relation Age of Onset  . Hemochromatosis Maternal Grandmother   . Migraines Maternal Grandmother   . Cancer Maternal Grandmother   . Hypertension Father   . Diabetes Father   . Coronary artery disease Father   . Migraines Paternal Grandmother   . Cancer Mother     breast  . Coronary artery disease Paternal Grandfather   . Hemochromatosis Mother     History  Substance Use Topics  . Smoking status: Current Every Day Smoker -- 0.50 packs/day for 5 years    Types: Cigarettes  . Smokeless tobacco: Never Used  . Alcohol Use: Yes     Comment: occasionally    OB History   Grav Para Term Preterm Abortions TAB SAB Ect Mult Living   2 1 1  1  1   1       Review of Systems  Gastrointestinal: Positive  for nausea, vomiting, abdominal pain and diarrhea.  All other systems reviewed and are negative.    Allergies  Codeine; Gabapentin; Metoclopramide hcl; Latex; and Tape  Home Medications   Current Outpatient Rx  Name  Route  Sig  Dispense  Refill  . amphetamine-dextroamphetamine (ADDERALL XR) 30 MG 24 hr capsule   Oral   Take 1 capsule (30 mg total) by mouth daily.   30 capsule   0   . clonazePAM (KLONOPIN) 0.5 MG tablet   Oral   Take 1 tablet (0.5 mg total) by mouth 2 (two) times daily as needed for anxiety.   30 tablet   0   . HYDROcodone-acetaminophen (NORCO/VICODIN) 5-325 MG per tablet      TAKE (1) TABLET BY MOUTH EVERY SIX HOURS AS NEEDED. USE SPARINGLY.   20 tablet   0     MUST LAST 30 DAYS   . nortriptyline (PAMELOR) 25 MG capsule      3 at bedtime   90 capsule    4   . nystatin-triamcinolone (MYCOLOG II) cream               . prenatal vitamin w/FE, FA (PRENATAL 1 + 1) 27-1 MG TABS               . promethazine (PHENERGAN) 25 MG tablet   Oral   Take 1 tablet (25 mg total) by mouth every 6 (six) hours as needed for nausea.   12 tablet   0   . rizatriptan (MAXALT) 10 MG tablet               . tiZANidine (ZANAFLEX) 4 MG tablet               . zolpidem (AMBIEN) 5 MG tablet      TAKE 1 TABLET BY MOUTH EVERY OTHER DAY AT BEDTIME.   15 tablet   1     BP 145/71  Pulse 102  Temp(Src) 97.9 F (36.6 C) (Oral)  Wt 189 lb (85.73 kg)  BMI 30.52 kg/m2  LMP 04/23/2013  Physical Exam  Nursing note and vitals reviewed. Constitutional: She is oriented to person, place, and time. She appears well-developed and well-nourished. No distress.  HENT:  Head: Normocephalic and atraumatic.  Eyes: Conjunctivae are normal.  Neck: Normal range of motion.  Cardiovascular: Normal rate and regular rhythm.  Exam reveals no gallop and no friction rub.   No murmur heard. Pulmonary/Chest: Effort normal and breath sounds normal. She has no wheezes. She has no rales. She exhibits no tenderness.  Abdominal: Soft. She exhibits no distension. There is tenderness. There is no rebound and no guarding.  RUQ and RLQ tenderness to palpation. No peritoneal signs.   Musculoskeletal: Normal range of motion.  Neurological: She is alert and oriented to person, place, and time. Coordination normal.  Speech is goal-oriented. Moves limbs without ataxia.   Skin: Skin is warm and dry.  Psychiatric: She has a normal mood and affect. Her behavior is normal.    ED Course  Procedures (including critical care time)  Labs Reviewed  CBC WITH DIFFERENTIAL - Abnormal; Notable for the following:    RBC 3.47 (*)    Hemoglobin 8.4 (*)    HCT 27.0 (*)    MCV 77.8 (*)    MCH 24.2 (*)    All other components within normal limits  BASIC METABOLIC PANEL - Abnormal; Notable  for the following:    Potassium 5.2 (*)  Glucose, Bld 115 (*)    All other components within normal limits  HEPATIC FUNCTION PANEL - Abnormal; Notable for the following:    AST 96 (*)    ALT 53 (*)    Indirect Bilirubin 0.2 (*)    All other components within normal limits  CG4 I-STAT (LACTIC ACID) - Abnormal; Notable for the following:    Lactic Acid, Venous 0.42 (*)    All other components within normal limits  URINALYSIS, ROUTINE W REFLEX MICROSCOPIC  PREGNANCY, URINE  LIPASE, BLOOD  LACTATE DEHYDROGENASE  OCCULT BLOOD, POC DEVICE   Dg Abd Acute W/chest  05/04/2013   *RADIOLOGY REPORT*  Clinical Data: Right-sided abdominal low back pain which radiates to right lower quadrant, nausea and vomiting  ACUTE ABDOMEN SERIES (ABDOMEN 2 VIEW & CHEST 1 VIEW)  Comparison: CT abdomen pelvis - 11/13/2012; chest radiograph - 12/17/2010  Findings: Grossly unchanged cardiac silhouette and mediastinal contours.  No focal parenchymal opacities.  No pleural effusion or pneumothorax.  Moderate colonic stool burden without evidence of obstruction.  No pneumoperitoneum, pneumatosis or portal venous gas.  Post cholecystectomy.  Enteric staple line overlies the left upper abdominal quadrant.  An additional surgical clip overlies the left mid hemiabdomen.  No definite abnormal intra-abdominal calcifications.  No acute osseous abnormalities.  IMPRESSION:  1.  No acute cardiopulmonary disease. 2.  Moderate colonic stool burden without evidence of obstruction.   Original Report Authenticated By: Tacey Ruiz, MD     1. Abdominal pain       MDM  6:11 AM Labs and urinalysis pending. Patient will have morphine and zofran for symptoms. Vitals stable and patient afebrile.   8:38 AM Labs show low hemoglobin. Occult stool negative. Urinalysis unremarkable for infection. Patient will have more pain medication.   10:29 AM Labs added include LDH and LFTs which are unremarkable. Patient not orthostatic. Hemoglobin  likely dropping due menstrual cycles. Vitals stable and patient afebrile. Patient could possibly be experiencing viral infection. Patient instructed to return for 24 hour recheck tomorrow. Patient instructed to return with worsening or concerning symptoms.     Emilia Beck, PA-C 05/04/13 1035

## 2013-05-04 NOTE — ED Notes (Signed)
Patient transported to X-ray 

## 2013-05-04 NOTE — ED Notes (Signed)
Patient was a t work Wednesday night and started vomiting, saw her OBGYN Thursday and it wasn't related to her ovarian cysts, had gastric bypass in 2007, pt now states that the pain is in her back and she has been vomiting

## 2013-05-04 NOTE — ED Provider Notes (Signed)
Medical screening examination/treatment/procedure(s) were performed by non-physician practitioner and as supervising physician I was immediately available for consultation/collaboration.  Olivia Mackie, MD 05/04/13 2103

## 2013-05-04 NOTE — ED Provider Notes (Signed)
Pt presents with abdominal pain with n/v/d X 2 days - hx of gastric bypass and cholecystectomy in the past.  She has ongoing sx, and my exam has ttp in the RLQ and the epigastrium without guarding or distention.  She has normal heart and lung sounds, and MMM.  Labs show anemia - pt admits to requiring Fe transfusions in the past with pregnancy.  Trend is downward over the last 2 years and pt denies blood in the stool.  Per PA, hemoccult negative.  No leukocytosis.  Check lactate, LFT's, fluids, AAS.  She has had 3 CT scans in the last 2 years, avoid CT imaging if possible.  Medical screening examination/treatment/procedure(s) were conducted as a shared visit with non-physician practitioner(s) and myself.  I personally evaluated the patient during the encounter  '  Vida Roller, MD 05/04/13 [phone]

## 2013-05-04 NOTE — ED Notes (Signed)
Pt also complains of frequent and painful urination

## 2013-05-04 NOTE — ED Notes (Signed)
Pt states she is having facial itching, denies sob, throat swelling.  PA informed.

## 2013-05-11 ENCOUNTER — Encounter: Payer: Self-pay | Admitting: *Deleted

## 2013-05-17 ENCOUNTER — Other Ambulatory Visit: Payer: Self-pay | Admitting: Family Medicine

## 2013-05-17 NOTE — Telephone Encounter (Signed)
May ref each times one no ref

## 2013-06-01 ENCOUNTER — Telehealth: Payer: Self-pay | Admitting: Family Medicine

## 2013-06-01 MED ORDER — HYDROCODONE-ACETAMINOPHEN 5-325 MG PO TABS: 1.0000 | ORAL_TABLET | Freq: Three times a day (TID) | ORAL | Status: DC | PRN

## 2013-06-01 NOTE — Telephone Encounter (Signed)
See other message 06/01/13

## 2013-06-01 NOTE — Telephone Encounter (Signed)
Per Dr. Brett Canales: may have refill of Hydrocodone- if continued problems needs office visit. No stronger pain medication advised. Med printed and faxed to [facility]. Patient notified.

## 2013-06-01 NOTE — Telephone Encounter (Signed)
See chart

## 2013-06-01 NOTE — Telephone Encounter (Signed)
As we said

## 2013-06-01 NOTE — Telephone Encounter (Signed)
Pt's shoulder is bothering her having some tingling in her arm at this point, she has been putting heat and ice on it as well as taking her hydrocodone 5-325 are not helping either. Please advise pt as what to do or can she get a stronger pain med. [facility]  ° °

## 2013-06-01 NOTE — Telephone Encounter (Signed)
Pt's shoulder is bothering her having some tingling in her arm at this point, she has been putting heat and ice on it as well as taking her hydrocodone 5-325 are not helping either. Please advise pt as what to do or can she get a stronger pain med. [facility]

## 2013-06-12 ENCOUNTER — Ambulatory Visit (INDEPENDENT_AMBULATORY_CARE_PROVIDER_SITE_OTHER): Payer: 59 | Admitting: Family Medicine

## 2013-06-12 ENCOUNTER — Encounter: Payer: Self-pay | Admitting: Family Medicine

## 2013-06-12 VITALS — BP 132/80 | Wt 189.0 lb

## 2013-06-12 DIAGNOSIS — S93401A Sprain of unspecified ligament of right ankle, initial encounter: Secondary | ICD-10-CM

## 2013-06-12 DIAGNOSIS — S93409A Sprain of unspecified ligament of unspecified ankle, initial encounter: Secondary | ICD-10-CM

## 2013-06-12 MED ORDER — NYSTATIN 100000 UNIT/ML MT SUSP: 500000.0000 [IU] | Freq: Four times a day (QID) | OROMUCOSAL | Status: DC

## 2013-06-12 MED ORDER — OXYCODONE-ACETAMINOPHEN 5-325 MG PO TABS: 1.0000 | ORAL_TABLET | ORAL | Status: DC | PRN

## 2013-06-12 NOTE — Progress Notes (Signed)
  Subjective:    Patient ID: Penny [name], female    DOB: [birth year], 27 y.o.   MRN: [id number]  HPIFall. Patient fell last Saturday. She is having bodyaches, headache, right foot pain and right shin pain from the fall She relates increased pain over the past couple days hard to walk on it. She cannot tolerate hydrocodone due to nausea but she can tolerate Percocet. Thrush in mouth. Patient relates rawness on her tongue she's concerned with thrush she has not been on any antibiotics recently PMH benign    Review of Systems See above    Objective:   Physical Exam  Normal common knee normal, ankle normal except for moderate tenderness on the lateral aspect and anterior aspect with some swelling      Assessment & Plan:  Ankle sprain - can't tolerate hydrocodone due to sickness can tolerate percocet. #20 NOT for long term use I do not feel that this patient needs x-rays I told her if not improved within a week's time let us know

## 2013-06-18 ENCOUNTER — Other Ambulatory Visit: Payer: Self-pay | Admitting: Family Medicine

## 2013-06-18 ENCOUNTER — Other Ambulatory Visit: Payer: Self-pay | Admitting: Adult Health

## 2013-06-18 NOTE — Telephone Encounter (Signed)
RX called in .

## 2013-06-18 NOTE — Telephone Encounter (Signed)
Ok with this and 2 additional

## 2013-06-20 ENCOUNTER — Encounter (HOSPITAL_COMMUNITY): Payer: Self-pay | Admitting: *Deleted

## 2013-06-20 ENCOUNTER — Emergency Department (HOSPITAL_COMMUNITY)
Admission: EM | Admit: 2013-06-20 | Discharge: 2013-06-21 | Disposition: A | Discharge status: Home / Self Care | Payer: 59 | Attending: Emergency Medicine | Admitting: Emergency Medicine

## 2013-06-20 DIAGNOSIS — E669 Obesity, unspecified: Secondary | ICD-10-CM | POA: Insufficient documentation

## 2013-06-20 DIAGNOSIS — R109 Unspecified abdominal pain: Secondary | ICD-10-CM | POA: Insufficient documentation

## 2013-06-20 DIAGNOSIS — Z9089 Acquired absence of other organs: Secondary | ICD-10-CM | POA: Insufficient documentation

## 2013-06-20 DIAGNOSIS — D649 Anemia, unspecified: Secondary | ICD-10-CM | POA: Insufficient documentation

## 2013-06-20 DIAGNOSIS — Z8719 Personal history of other diseases of the digestive system: Secondary | ICD-10-CM | POA: Insufficient documentation

## 2013-06-20 DIAGNOSIS — Z975 Presence of (intrauterine) contraceptive device: Secondary | ICD-10-CM | POA: Insufficient documentation

## 2013-06-20 DIAGNOSIS — F988 Other specified behavioral and emotional disorders with onset usually occurring in childhood and adolescence: Secondary | ICD-10-CM | POA: Insufficient documentation

## 2013-06-20 DIAGNOSIS — Z91040 Latex allergy status: Secondary | ICD-10-CM | POA: Insufficient documentation

## 2013-06-20 DIAGNOSIS — Z8679 Personal history of other diseases of the circulatory system: Secondary | ICD-10-CM | POA: Insufficient documentation

## 2013-06-20 DIAGNOSIS — F329 Major depressive disorder, single episode, unspecified: Secondary | ICD-10-CM | POA: Insufficient documentation

## 2013-06-20 DIAGNOSIS — R51 Headache: Secondary | ICD-10-CM | POA: Insufficient documentation

## 2013-06-20 DIAGNOSIS — Z8742 Personal history of other diseases of the female genital tract: Secondary | ICD-10-CM | POA: Insufficient documentation

## 2013-06-20 DIAGNOSIS — Z79899 Other long term (current) drug therapy: Secondary | ICD-10-CM | POA: Insufficient documentation

## 2013-06-20 DIAGNOSIS — Z3202 Encounter for pregnancy test, result negative: Secondary | ICD-10-CM | POA: Insufficient documentation

## 2013-06-20 DIAGNOSIS — Z862 Personal history of diseases of the blood and blood-forming organs and certain disorders involving the immune mechanism: Secondary | ICD-10-CM | POA: Insufficient documentation

## 2013-06-20 DIAGNOSIS — F17200 Nicotine dependence, unspecified, uncomplicated: Secondary | ICD-10-CM | POA: Insufficient documentation

## 2013-06-20 DIAGNOSIS — F3289 Other specified depressive episodes: Secondary | ICD-10-CM | POA: Insufficient documentation

## 2013-06-20 DIAGNOSIS — G8929 Other chronic pain: Secondary | ICD-10-CM | POA: Insufficient documentation

## 2013-06-20 NOTE — ED Notes (Signed)
Pt reports history of ovarian cysts, states that she began period on Sunday and pain began yesterday increasing through tonight.  Pt reporting pain in RLQ.  Denies nausea or vomiting.

## 2013-06-21 LAB — URINALYSIS, ROUTINE W REFLEX MICROSCOPIC
Leukocytes, UA: NEGATIVE
Nitrite: NEGATIVE
Specific Gravity, Urine: 1.01 (ref 1.005–1.030)
Urobilinogen, UA: 0.2 mg/dL (ref 0.0–1.0)

## 2013-06-21 LAB — COMPREHENSIVE METABOLIC PANEL
ALT: 24 U/L (ref 0–35)
AST: 22 U/L (ref 0–37)
Albumin: 3.9 g/dL (ref 3.5–5.2)
Alkaline Phosphatase: 68 U/L (ref 39–117)
CO2: 27 mEq/L (ref 19–32)
Chloride: 102 mEq/L (ref 96–112)
Creatinine, Ser: 0.61 mg/dL (ref 0.50–1.10)
GFR calc non Af Amer: >90 mL/min (ref >90)
Potassium: 4 mEq/L (ref 3.5–5.1)
Sodium: 138 mEq/L (ref 135–145)
Total Bilirubin: 0.2 mg/dL — ABNORMAL LOW (ref 0.3–1.2)

## 2013-06-21 LAB — CBC WITH DIFFERENTIAL/PLATELET
Basophils Absolute: 0 10*3/uL (ref 0.0–0.1)
Basophils Relative: 0 % (ref 0–1)
HCT: 29.3 % — ABNORMAL LOW (ref 36.0–46.0)
Lymphocytes Relative: 40 % (ref 12–46)
MCHC: 31.7 g/dL (ref 30.0–36.0)
Monocytes Absolute: 0.6 10*3/uL (ref 0.1–1.0)
Neutro Abs: 3.1 10*3/uL (ref 1.7–7.7)
Neutrophils Relative %: 49 % (ref 43–77)
RDW: 15.3 % (ref 11.5–15.5)
WBC: 6.5 10*3/uL (ref 4.0–10.5)

## 2013-06-21 LAB — PREGNANCY, URINE: Preg Test, Ur: NEGATIVE

## 2013-06-21 LAB — URINE MICROSCOPIC-ADD ON

## 2013-06-21 MED ORDER — ONDANSETRON HCL 4 MG/2ML IJ SOLN
4.0000 mg | Freq: Once | INTRAMUSCULAR | Status: DC
  Filled 2013-06-21: qty 2

## 2013-06-21 MED ORDER — OXYCODONE-ACETAMINOPHEN 7.5-325 MG PO TABS: 1.0000 | ORAL_TABLET | ORAL | Status: DC | PRN

## 2013-06-21 MED ORDER — HYDROMORPHONE HCL PF 1 MG/ML IJ SOLN
1.0000 mg | Freq: Once | INTRAMUSCULAR | Status: AC
  Administered 2013-06-21: 1 mg via INTRAVENOUS
  Filled 2013-06-21: qty 1

## 2013-06-21 MED ORDER — PROMETHAZINE HCL 25 MG/ML IJ SOLN
25.0000 mg | Freq: Once | INTRAMUSCULAR | Status: DC
  Filled 2013-06-21: qty 1

## 2013-06-21 MED ORDER — SODIUM CHLORIDE 0.9 % IV SOLN
1000.0000 mL | Freq: Once | INTRAVENOUS | Status: AC
  Administered 2013-06-21: 1000 mL via INTRAVENOUS

## 2013-06-21 MED ORDER — SODIUM CHLORIDE 0.9 % IV SOLN
1000.0000 mL | INTRAVENOUS | Status: DC
  Administered 2013-06-21: 500 mL via INTRAVENOUS

## 2013-06-21 NOTE — ED Provider Notes (Signed)
History    CSN: [id number] Arrival date & time 06/20/13  2346  First MD Initiated Contact with Patient 06/21/13 0040     Chief Complaint  Patient presents with  . Abdominal Pain   (Consider location/radiation/quality/duration/timing/severity/associated sxs/prior Treatment) Patient is a 27 y.o. female presenting with abdominal pain. The history is provided by the patient.  Abdominal Pain Associated symptoms include abdominal pain.  She has been having pain in the right side of her abdomen for the last 2 days but it got worse tonight. Pain is worst in the right lower quadrant but radiates up to the right upper quadrant. There is no radiation to the back. There is nausea but no vomiting. She denies urinary urgency, frequency, tenesmus, dysuria. She denies constipation or diarrhea. Pain is severe and she rates it 10/10. It is better if she holds something like a pillow against her abdomen and worse if she is moving around. She has had similar pain in the past which has been do to ovarian cysts. She is currently on her menses which began 3 days ago. She is taking oxycodone-acetaminophen without relief of pain. Of note, she is status post cholecystectomy. Past Medical History  Diagnosis Date  . Migraines   . Interstitial cystitis   . IUD FEB 2010  . Gastritis JULY 2011  . Depression   . 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 Fatty liver   . Obesity (BMI 30-39.9) 2011 228 LBS BMI 36.8  . Iron deficiency anemia 07/23/2010  . Irritable bowel syndrome 2012 DIARRHEA    JUN 2012 TTG IgA 14.9  . Elevated liver enzymes JUL 2011ALK PHOS 111-127 AST  143-267 ALT  213-321T BILI 0.6  ALB  3.7-4.06 Jun 2011 ALK PHOS 118 AST 24 ALT 42 T BILI 0.4 ALB 3.9  . Chronic daily headache   . Ovarian cyst   . PONV (postoperative nausea and vomiting)   . ADD (attention deficit disorder)    Past Surgical History  Procedure Laterality Date  . Gab  2007    in [place]-POUCH 5 CM  . Cholecystectomy  2005     biliary dyskinesia  . Cath self every nite      for sodium bicarb injection (discontinued 2013)  . Colonoscopy  JUN 2012 ABD PN/DIARRHEA WITH PROPOFOL    NL COLON  . Upper gastrointestinal endoscopy  JULY 2011 NAUSEA-D125,V6, PH 25    Bx; GASTRITIS, POUCH-5 CM LONG  . Dilation and curettage of uterus    . Tonsillectomy    . Gastric bypass  06/2006  . Savory dilation  06/20/2012    Procedure: SAVORY DILATION;  Surgeon: West Bali, MD;  Location: [facility];  Service: Endoscopy;  Laterality: N/A;  16 savory  . Tonsillectomy and adenoidectomy    . Esophagogastroduodenoscopy     Family History  Problem Relation Age of Onset  . Hemochromatosis Maternal Grandmother   . Migraines Maternal Grandmother   . Cancer Maternal Grandmother   . Hypertension Father   . Diabetes Father   . Coronary artery disease Father   . Migraines Paternal Grandmother   . Cancer Mother     breast  . Coronary artery disease Paternal Grandfather   . Hemochromatosis Mother    History  Substance Use Topics  . Smoking status: Current Every Day Smoker -- 0.50 packs/day for 5 years    Types: Cigarettes  . Smokeless tobacco: Never Used  . Alcohol Use: Yes  Comment: occasionally   OB History   Grav Para Term Preterm Abortions TAB SAB Ect Mult Living   2 1 1  1  1   1      Review of Systems  Gastrointestinal: Positive for abdominal pain.  All other systems reviewed and are negative.    Allergies  Gabapentin; Codeine; Topamax; Latex; Metoclopramide hcl; and Tape  Home Medications   Current Outpatient Rx  Name  Route  Sig  Dispense  Refill  . amphetamine-dextroamphetamine (ADDERALL XR) 30 MG 24 hr capsule   Oral   Take 30 mg by mouth every morning.         . clonazePAM (KLONOPIN) 0.5 MG tablet      TAKE (1) TABLET BY MOUTH (2) TIMES DAILY AS NEEDED FOR ANXIETY.   30 tablet   0   . nortriptyline (PAMELOR) 25 MG capsule   Oral   Take 25 mg by mouth daily with lunch.         . nystatin  (MYCOSTATIN) 100000 UNIT/ML suspension   Oral   Take 5 mLs (500,000 Units total) by mouth 4 (four) times daily.   140 mL   0   . nystatin-triamcinolone (MYCOLOG II) cream   Topical   Apply 1 application topically 2 (two) times daily as needed (for itching).          Marland Kitchen oxyCODONE-acetaminophen (PERCOCET/ROXICET) 5-325 MG per tablet   Oral   Take 1 tablet by mouth every 4 (four) hours as needed for pain.   20 tablet   0   . Prenatal Vit-Fe Fumarate-FA (PRENATAL MULTIVITAMIN) TABS   Oral   Take 1 tablet by mouth every morning.         . promethazine (PHENERGAN) 25 MG tablet   Oral   Take 1 tablet (25 mg total) by mouth every 6 (six) hours as needed for nausea.   12 tablet   0   . tiZANidine (ZANAFLEX) 4 MG tablet   Oral   Take 4 mg by mouth every 8 (eight) hours as needed (for pain).          Marland Kitchen zolpidem (AMBIEN) 5 MG tablet      TAKE 1 TABLET BY MOUTH EVERY OTHER DAY AT BEDTIME.   15 tablet   1    BP 104/77  Pulse 60  Temp(Src) 98.6 F (37 C) (Oral)  Resp 20  Ht 5\' 6"  (1.676 m)  Wt 189 lb (85.73 kg)  BMI 30.52 kg/m2  SpO2 97% Physical Exam  Nursing note and vitals reviewed.  27 year old female, who appears to be in pain, but is in no acute distress. Vital signs are normal. Oxygen saturation is 97%, which is normal. Head is normocephalic and atraumatic. PERRLA, EOMI. Oropharynx is clear. Neck is nontender and supple without adenopathy or JVD. Back is nontender in the midline. There is mild right CVA tenderness. Lungs are clear without rales, wheezes, or rhonchi. Chest is nontender. Heart has regular rate and rhythm without murmur. Abdomen is soft, flat, with moderate tenderness along the right side of the abdomen with worse tenderness pain in the right upper quadrant. There is no rebound or guarding. There are no masses or hepatosplenomegaly and peristalsis is hypoactive. Extremities have no cyanosis or edema, full range of motion is present. Skin is warm  and dry without rash. Neurologic: Mental status is normal, cranial nerves are intact, there are no motor or sensory deficits.  ED Course  Procedures (including critical care time)  Results for orders placed during the hospital encounter of 06/20/13  URINALYSIS, ROUTINE W REFLEX MICROSCOPIC      Result Value Range   Color, Urine YELLOW  YELLOW   APPearance HAZY (*) CLEAR   Specific Gravity, Urine 1.010  1.005 - 1.030   pH 7.5  5.0 - 8.0   Glucose, UA NEGATIVE  NEGATIVE mg/dL   Hgb urine dipstick LARGE (*) NEGATIVE   Bilirubin Urine NEGATIVE  NEGATIVE   Ketones, ur NEGATIVE  NEGATIVE mg/dL   Protein, ur NEGATIVE  NEGATIVE mg/dL   Urobilinogen, UA 0.2  0.0 - 1.0 mg/dL   Nitrite NEGATIVE  NEGATIVE   Leukocytes, UA NEGATIVE  NEGATIVE  URINE MICROSCOPIC-ADD ON      Result Value Range   Squamous Epithelial / LPF FEW (*) RARE   WBC, UA 0-2  <3 WBC/hpf   RBC / HPF TOO NUMEROUS TO COUNT  <3 RBC/hpf  CBC WITH DIFFERENTIAL      Result Value Range   WBC 6.5  4.0 - 10.5 K/uL   RBC 3.80 (*) 3.87 - 5.11 MIL/uL   Hemoglobin 9.3 (*) 12.0 - 15.0 g/dL   HCT 16.1 (*) 09.6 - 04.5 %   MCV 77.1 (*) 78.0 - 100.0 fL   MCH 24.5 (*) 26.0 - 34.0 pg   MCHC 31.7  30.0 - 36.0 g/dL   RDW 40.9  81.1 - 91.4 %   Platelets 330  150 - 400 K/uL   Neutrophils Relative % 49  43 - 77 %   Neutro Abs 3.1  1.7 - 7.7 K/uL   Lymphocytes Relative 40  12 - 46 %   Lymphs Abs 2.6  0.7 - 4.0 K/uL   Monocytes Relative 9  3 - 12 %   Monocytes Absolute 0.6  0.1 - 1.0 K/uL   Eosinophils Relative 2  0 - 5 %   Eosinophils Absolute 0.1  0.0 - 0.7 K/uL   Basophils Relative 0  0 - 1 %   Basophils Absolute 0.0  0.0 - 0.1 K/uL  COMPREHENSIVE METABOLIC PANEL      Result Value Range   Sodium 138  135 - 145 mEq/L   Potassium 4.0  3.5 - 5.1 mEq/L   Chloride 102  96 - 112 mEq/L   CO2 27  19 - 32 mEq/L   Glucose, Bld 98  70 - 99 mg/dL   BUN 16  6 - 23 mg/dL   Creatinine, Ser 7.82  0.50 - 1.10 mg/dL   Calcium 9.3  8.4 - 95.6 mg/dL    Total Protein 7.6  6.0 - 8.3 g/dL   Albumin 3.9  3.5 - 5.2 g/dL   AST 22  0 - 37 U/L   ALT 24  0 - 35 U/L   Alkaline Phosphatase 68  39 - 117 U/L   Total Bilirubin 0.2 (*) 0.3 - 1.2 mg/dL   GFR calc non Af Amer >90  >90 mL/min   GFR calc Af Amer >90  >90 mL/min  LIPASE, BLOOD      Result Value Range   Lipase 36  11 - 59 U/L  PREGNANCY, URINE      Result Value Range   Preg Test, Ur NEGATIVE  NEGATIVE    1. Abdominal pain   2. Anemia     MDM  Abdominal pain of uncertain cause. It consists is definitely a possibility. With a right-sided pain, need to consider appendicitis but picture is somewhat atypical. Old records are reviewed  and she has had several ED visits for similar complaints and has had negative CT scans on several occasions-I have found 5 CT scans of her abdomen and pelvis on record. She's also had several pelvic ultrasounds to look for possible ovarian torsion and these have been negative. She will be given IV fluids and IV analgesics and antiemetics. Currently, she does not show any signs of having severe underlying pathology. In an attempt to minimize exposure ionizing radiation, CT scan we'll not be obtained of this worrisome findings are present on laboratory testing.  Workup is significant only for anemia with hemoglobin 9.3. This is actually about 1 g higher than last recorded hemoglobin. Patient got reasonably good relief of pain with hydromorphone but states that she is out of her oxycodone-acetaminophen. Cause for her pain is unclear. Multiple negative workups have been done in the past. I am wondering if she may have been related to scar tissue from her prior gastric bypass surgery. She is referred back to her PCP into her gastroenterologist.  Dione Booze, MD 06/21/13 0230

## 2013-07-06 ENCOUNTER — Telehealth: Payer: Self-pay | Admitting: Family Medicine

## 2013-07-06 MED ORDER — AMPHETAMINE-DEXTROAMPHET ER 30 MG PO CP24: 30.0000 mg | ORAL_CAPSULE | Freq: Every morning | ORAL | Status: DC

## 2013-07-06 NOTE — Telephone Encounter (Signed)
Rx printed and left up front for patient pick up 

## 2013-07-06 NOTE — Telephone Encounter (Signed)
Patient needs Rx for Adderall. She would like to pick it up today because she has to work all weekend.

## 2013-07-06 NOTE — Telephone Encounter (Signed)
May have one refill, also rec f/u ov in a few weeks for ADD, then we can give 3 scripts to last her

## 2013-07-11 ENCOUNTER — Other Ambulatory Visit: Payer: Self-pay | Admitting: *Deleted

## 2013-07-11 MED ORDER — CLONAZEPAM 0.5 MG PO TABS: 0.5000 mg | ORAL_TABLET | Freq: Two times a day (BID) | ORAL | Status: DC | PRN

## 2013-07-11 MED ORDER — TIZANIDINE HCL 4 MG PO TABS: 4.0000 mg | ORAL_TABLET | Freq: Three times a day (TID) | ORAL | Status: DC | PRN

## 2013-07-11 NOTE — Progress Notes (Signed)
Patient requested Rx for Percocet- Dr Lorin Picket stated that he would not be providing patient with long term or monthly narcotics. Patient was notified. Offered to set patient up with headache specialist or pain management.

## 2013-07-18 ENCOUNTER — Ambulatory Visit (INDEPENDENT_AMBULATORY_CARE_PROVIDER_SITE_OTHER): Payer: 59 | Admitting: Adult Health

## 2013-07-18 ENCOUNTER — Encounter: Payer: Self-pay | Admitting: Adult Health

## 2013-07-18 ENCOUNTER — Ambulatory Visit (INDEPENDENT_AMBULATORY_CARE_PROVIDER_SITE_OTHER): Payer: 59

## 2013-07-18 VITALS — BP 124/60 | Ht 66.0 in | Wt 184.0 lb

## 2013-07-18 DIAGNOSIS — R1031 Right lower quadrant pain: Secondary | ICD-10-CM

## 2013-07-18 DIAGNOSIS — N920 Excessive and frequent menstruation with regular cycle: Secondary | ICD-10-CM | POA: Insufficient documentation

## 2013-07-18 HISTORY — DX: Right lower quadrant pain: R10.31

## 2013-07-18 HISTORY — DX: Excessive and frequent menstruation with regular cycle: N92.0

## 2013-07-18 MED ORDER — OXYCODONE-ACETAMINOPHEN 7.5-325 MG PO TABS: 1.0000 | ORAL_TABLET | ORAL | Status: DC | PRN

## 2013-07-18 MED ORDER — NYSTATIN-TRIAMCINOLONE 100000-0.1 UNIT/GM-% EX CREA: 1.0000 "application " | TOPICAL_CREAM | Freq: Two times a day (BID) | CUTANEOUS | Status: DC | PRN

## 2013-07-18 NOTE — Progress Notes (Signed)
Subjective:     Patient ID: Penny [name], female   DOB: October 12, 1986, 27 y.o.   MRN: [id number]  HPI Penny [name] is a 27 year old white female in complaining of RLQ pain and bleeding heavier.She has a headache about 2 days prior to the period also.She has been seen in the ER for this.She desires pregnancy in near future.  Review of Systems Positives in HPI Reviewed past medical,surgical, social and family history. Reviewed medications and allergies.     Objective:   Physical Exam BP 124/60  Ht 5\' 6"  (1.676 m)  Wt 184 lb (83.462 kg)  BMI 29.71 kg/m2  LMP 07/15/2013   Skin warm and dry.Pelvic: external genitalia is normal in appearance, vagina: blood in vault like menses, cervix:smooth and bulbous, uterus: normal size, shape and contour,tender, no masses felt, adnexa: no masses, tenderness noted in RLQ, got [name] while she was here and it showed only bowel.  Assessment:    RLQ pain ?bowel Menorrhagia    Plan:     Rx percocet 7.5 mg/325 #20 1 every 6 hours prn pain no refills Follow up with Dr Darrick Penna asap, will refer Return prn   Refilled mytrex at her request

## 2013-07-18 NOTE — Patient Instructions (Addendum)
Follow up with dr Darrick Penna

## 2013-07-18 NOTE — Addendum Note (Signed)
Addended by: Cyril Mourning A on: 07/18/2013 12:44 PM   Modules accepted: Orders

## 2013-07-31 ENCOUNTER — Other Ambulatory Visit: Payer: Self-pay

## 2013-07-31 ENCOUNTER — Telehealth: Payer: Self-pay | Admitting: Family Medicine

## 2013-07-31 MED ORDER — TIZANIDINE HCL 4 MG PO TABS: 4.0000 mg | ORAL_TABLET | Freq: Three times a day (TID) | ORAL | Status: DC | PRN

## 2013-07-31 NOTE — Telephone Encounter (Signed)
zanaflex may be refilled times 2. With pts health history I do not recommend refilling hydrocodone.

## 2013-07-31 NOTE — Telephone Encounter (Signed)
Notified patient zanaflex may be refilled times 2. With pts health history doctor does not recommend refilling hydrocodone. Zanaflex RX sent to [facility]. Patient verbalized understanding.

## 2013-07-31 NOTE — Telephone Encounter (Signed)
Pt wants to know if you will fill her Hydrocodone and Zanaflex early? She will schedule an appt, I advised the pt that you typically don't do this but I would send back the note. [facility]

## 2013-08-07 ENCOUNTER — Telehealth: Payer: Self-pay | Admitting: Family Medicine

## 2013-08-07 MED ORDER — NORTRIPTYLINE HCL 25 MG PO CAPS: 25.0000 mg | ORAL_CAPSULE | Freq: Every day | ORAL | Status: DC

## 2013-08-07 NOTE — Telephone Encounter (Signed)
Pt wants to know if she can get enough HYDROcodone-acetaminophen (NORCO/VICODIN) 5-325 MG per tablet, nortriptyline (PAMELOR) 25 MG capsule  Until she can come into her appt on Thursday? Call in to [facility]

## 2013-08-07 NOTE — Telephone Encounter (Addendum)
Recent pain meds -Percocet 7.5/325mg  #20 filled 8/13-([facility])                                 Percocet 5/325mg  #20 filled 7/28([facility])                                 Vicodin 5/325 #20 6/27([facility])                                  Vicodin 5/325 #20 5/31 ([facility]) Requested refill Percocet 8/6 an was denied-Requested refill hydrocodone 8/26 and was denied  Consult with Dr Lorin Picket we will not be filling Hydrocodone or Percocet. We can arrange patient to see pain management or head ache specialist if patient desires   7 days Rx for Pamelor sent electronically to [facility].

## 2013-08-07 NOTE — Telephone Encounter (Signed)
Left message to return call 

## 2013-08-07 NOTE — Telephone Encounter (Signed)
Patient was notified.

## 2013-08-09 ENCOUNTER — Ambulatory Visit (INDEPENDENT_AMBULATORY_CARE_PROVIDER_SITE_OTHER): Payer: 59 | Admitting: Family Medicine

## 2013-08-09 ENCOUNTER — Encounter: Payer: Self-pay | Admitting: Family Medicine

## 2013-08-09 VITALS — BP 122/78 | Ht 66.0 in | Wt 182.4 lb

## 2013-08-09 DIAGNOSIS — F988 Other specified behavioral and emotional disorders with onset usually occurring in childhood and adolescence: Secondary | ICD-10-CM

## 2013-08-09 DIAGNOSIS — R51 Headache: Secondary | ICD-10-CM

## 2013-08-09 MED ORDER — OXYCODONE-ACETAMINOPHEN 5-325 MG PO TABS: 1.0000 | ORAL_TABLET | Freq: Four times a day (QID) | ORAL | Status: DC | PRN

## 2013-08-09 MED ORDER — CLONAZEPAM 0.5 MG PO TABS: 0.5000 mg | ORAL_TABLET | Freq: Two times a day (BID) | ORAL | Status: DC | PRN

## 2013-08-09 MED ORDER — PROMETHAZINE HCL 25 MG PO TABS: 25.0000 mg | ORAL_TABLET | Freq: Four times a day (QID) | ORAL | Status: DC | PRN

## 2013-08-09 MED ORDER — AMPHETAMINE-DEXTROAMPHET ER 30 MG PO CP24: 30.0000 mg | ORAL_CAPSULE | Freq: Every morning | ORAL | Status: DC

## 2013-08-09 NOTE — Progress Notes (Signed)
  Subjective:    Patient ID: Penny [name], female    DOB: 05/30/1986, 27 y.o.   MRN: [id number]  HPI Patient arrives for follow up on ADHD. Patient was seen today for ADD checkup. The following items were discussed in detail. -Compliance with medication was assessed -Importance of study time, doing homework, paying attention/taking good notes in school. -Importance of family involvement with learning -Discussion of many side effects with medications -A review of the patient's blood pressure and weight and eating habits -A review of patient's sleeping habits -Additional issues or questions that family had was addressed in noted below This patient also has history of musculoskeletal headaches. 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 Apparently they do some adjustments to try to help this out she had a previous gastric bypass and so therefore she can't use NSAIDs she in the past had some trouble with pain medication overuse for which she did have to get some treatment for.  Patient also states anxiety issues are still present yet stable she also states her moods are doing okay she denies being depressed currently. Denies being suicidal. She does relate some intermittent numbness into the right arm. She still works full time. She states her stress levels are doable. She currently is not pregnant and not planning on getting pregnant. Review of Systems Base of skull tender with some numbness, at least weekly   see above. Objective:   Physical Exam Alert and oriented eardrums normal throat is normal neck no masses she does have some intermittent subjective discomfort in the posterior portion of the right side of the neck fair range of motion. Her  lungs are clear hearts regular pulse normal blood pressure good        Assessment & Plan:  #1 ADD-prescriptions were given. 3 prescriptions followup in 3 months for the ADD. #2 anxiety issues Klonopin no greater than twice per day she states she does not take it very often #3  musculoskeletal headache in the posterior portion of her head. She may use Percocet sparingly 5 mg/325. #10 was given with no refills. Cautioned drowsiness. I don't recommend this frequently. She was cautioned not to over use pain medicines to avoid addiction she understands this. I also offered to set her up with headache specialist or physical medicine specialist for possible Botox injections 25-30 minutes spent with patient.

## 2013-08-09 NOTE — Patient Instructions (Signed)
You have been seen today as part of a visit on ADD. The law is very strict on prescriptions for ADD.We must show that we are monitoring patient's closely. You have been  given several prescriptions today that will cover you till your next visit. It is very important that you schedule an office visit before you run out of medications. This is your responsibility. We will not provide additional refills via phone calls. Do not lose your medication it will not be replaced. We look forward to seeing you at your next visit.  

## 2013-08-10 ENCOUNTER — Telehealth: Payer: Self-pay | Admitting: Family Medicine

## 2013-08-10 MED ORDER — NORTRIPTYLINE HCL 25 MG PO CAPS: 25.0000 mg | ORAL_CAPSULE | Freq: Every day | ORAL | Status: DC

## 2013-08-10 NOTE — Telephone Encounter (Signed)
Rx sent electronically to [facility]. Patient notified. 

## 2013-08-10 NOTE — Telephone Encounter (Signed)
Patient wants to check the amount of nortriptyline (PAMELOR) 25 MG capsule that was sent to pharmacy on Thursday August 09, 2013.  States she only received 7 capsules.  Was under the impression there should have been more in this prescription.  Please call Patient. Thanks

## 2013-08-12 ENCOUNTER — Encounter (HOSPITAL_COMMUNITY): Payer: Self-pay | Admitting: Emergency Medicine

## 2013-08-12 ENCOUNTER — Emergency Department (HOSPITAL_COMMUNITY): Payer: 59

## 2013-08-12 ENCOUNTER — Emergency Department (HOSPITAL_COMMUNITY)
Admission: EM | Admit: 2013-08-12 | Discharge: 2013-08-12 | Disposition: A | Discharge status: Home / Self Care | Payer: 59 | Attending: Emergency Medicine | Admitting: Emergency Medicine

## 2013-08-12 DIAGNOSIS — G43909 Migraine, unspecified, not intractable, without status migrainosus: Secondary | ICD-10-CM | POA: Insufficient documentation

## 2013-08-12 DIAGNOSIS — F988 Other specified behavioral and emotional disorders with onset usually occurring in childhood and adolescence: Secondary | ICD-10-CM | POA: Insufficient documentation

## 2013-08-12 DIAGNOSIS — Z9884 Bariatric surgery status: Secondary | ICD-10-CM | POA: Insufficient documentation

## 2013-08-12 DIAGNOSIS — F329 Major depressive disorder, single episode, unspecified: Secondary | ICD-10-CM | POA: Insufficient documentation

## 2013-08-12 DIAGNOSIS — Z79899 Other long term (current) drug therapy: Secondary | ICD-10-CM | POA: Insufficient documentation

## 2013-08-12 DIAGNOSIS — R0600 Dyspnea, unspecified: Secondary | ICD-10-CM

## 2013-08-12 DIAGNOSIS — Z8742 Personal history of other diseases of the female genital tract: Secondary | ICD-10-CM | POA: Insufficient documentation

## 2013-08-12 DIAGNOSIS — D509 Iron deficiency anemia, unspecified: Secondary | ICD-10-CM | POA: Insufficient documentation

## 2013-08-12 DIAGNOSIS — F17200 Nicotine dependence, unspecified, uncomplicated: Secondary | ICD-10-CM | POA: Insufficient documentation

## 2013-08-12 DIAGNOSIS — Z91040 Latex allergy status: Secondary | ICD-10-CM | POA: Insufficient documentation

## 2013-08-12 DIAGNOSIS — Z8719 Personal history of other diseases of the digestive system: Secondary | ICD-10-CM | POA: Insufficient documentation

## 2013-08-12 DIAGNOSIS — Z9089 Acquired absence of other organs: Secondary | ICD-10-CM | POA: Insufficient documentation

## 2013-08-12 DIAGNOSIS — R0989 Other specified symptoms and signs involving the circulatory and respiratory systems: Secondary | ICD-10-CM | POA: Insufficient documentation

## 2013-08-12 DIAGNOSIS — J3489 Other specified disorders of nose and nasal sinuses: Secondary | ICD-10-CM | POA: Insufficient documentation

## 2013-08-12 DIAGNOSIS — R0609 Other forms of dyspnea: Secondary | ICD-10-CM | POA: Insufficient documentation

## 2013-08-12 DIAGNOSIS — Z975 Presence of (intrauterine) contraceptive device: Secondary | ICD-10-CM | POA: Insufficient documentation

## 2013-08-12 DIAGNOSIS — Z87448 Personal history of other diseases of urinary system: Secondary | ICD-10-CM | POA: Insufficient documentation

## 2013-08-12 DIAGNOSIS — F3289 Other specified depressive episodes: Secondary | ICD-10-CM | POA: Insufficient documentation

## 2013-08-12 DIAGNOSIS — Z8639 Personal history of other endocrine, nutritional and metabolic disease: Secondary | ICD-10-CM | POA: Insufficient documentation

## 2013-08-12 DIAGNOSIS — Z862 Personal history of diseases of the blood and blood-forming organs and certain disorders involving the immune mechanism: Secondary | ICD-10-CM | POA: Insufficient documentation

## 2013-08-12 MED ORDER — ALBUTEROL SULFATE HFA 108 (90 BASE) MCG/ACT IN AERS
2.0000 | INHALATION_SPRAY | RESPIRATORY_TRACT | Status: DC | PRN
  Administered 2013-08-12: 2 via RESPIRATORY_TRACT
  Filled 2013-08-12: qty 6.7

## 2013-08-12 MED ORDER — ALBUTEROL SULFATE (5 MG/ML) 0.5% IN NEBU
5.0000 mg | INHALATION_SOLUTION | Freq: Once | RESPIRATORY_TRACT | Status: AC
  Administered 2013-08-12: 5 mg via RESPIRATORY_TRACT
  Filled 2013-08-12: qty 1

## 2013-08-12 NOTE — ED Notes (Addendum)
Patient with c/o shortness of breath that started this morning around 0900 with progression in symptoms throughout the day. Patient with noted deep inspiration, rr 26. Increased work of breathing noted.

## 2013-08-12 NOTE — ED Provider Notes (Signed)
CSN: [id number]     Arrival date & time 08/12/13  1727 History   First MD Initiated Contact with Patient 08/12/13 1734     Chief Complaint  Patient presents with  . Shortness of Breath   (Consider location/radiation/quality/duration/timing/severity/associated sxs/prior Treatment) HPI Comments: Patient is a 27 year old female with past medical history significant for psychiatric issues, migraine headaches. She presents with complaints of shortness of breath which started this morning shortly after she woke up. It has been getting worse since that time. She reports congestion for the past 2 days but no fevers. She denies a history of asthma and states that this has never happened to her before.  Patient is a 27 y.o. female presenting with shortness of breath. The history is provided by the patient.  Shortness of Breath Severity:  Severe Onset quality:  Sudden Timing:  Constant Progression:  Worsening Chronicity:  New Context: activity   Relieved by:  Nothing Worsened by:  Nothing tried Ineffective treatments:  None tried Associated symptoms: no abdominal pain and no fever     Past Medical History  Diagnosis Date  . Migraines   . Interstitial cystitis   . IUD FEB 2010  . Gastritis JULY 2011  . Depression   . 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 Fatty liver   . Obesity (BMI 30-39.9) 2011 228 LBS BMI 36.8  . Iron deficiency anemia 07/23/2010  . Irritable bowel syndrome 2012 DIARRHEA    JUN 2012 TTG IgA 14.9  . Elevated liver enzymes JUL 2011ALK PHOS 111-127 AST  143-267 ALT  213-321T BILI 0.6  ALB  3.7-4.06 Jun 2011 ALK PHOS 118 AST 24 ALT 42 T BILI 0.4 ALB 3.9  . Chronic daily headache   . Ovarian cyst   . PONV (postoperative nausea and vomiting)   . ADD (attention deficit disorder)   . RLQ abdominal pain 07/18/2013  . Menorrhagia 07/18/2013   Past Surgical History  Procedure Laterality Date  . Gab  2007    in [place]-POUCH 5 CM  . Cholecystectomy  2005    biliary  dyskinesia  . Cath self every nite      for sodium bicarb injection (discontinued 2013)  . Colonoscopy  JUN 2012 ABD PN/DIARRHEA WITH PROPOFOL    NL COLON  . Upper gastrointestinal endoscopy  JULY 2011 NAUSEA-D125,V6, PH 25    Bx; GASTRITIS, POUCH-5 CM LONG  . Dilation and curettage of uterus    . Tonsillectomy    . Gastric bypass  06/2006  . Savory dilation  06/20/2012    Procedure: SAVORY DILATION;  Surgeon: West Bali, MD;  Location: [facility];  Service: Endoscopy;  Laterality: N/A;  16 savory  . Tonsillectomy and adenoidectomy    . Esophagogastroduodenoscopy     Family History  Problem Relation Age of Onset  . Hemochromatosis Maternal Grandmother   . Migraines Maternal Grandmother   . Cancer Maternal Grandmother   . Hypertension Father   . Diabetes Father   . Coronary artery disease Father   . Migraines Paternal Grandmother   . Cancer Mother     breast  . Hemochromatosis Mother   . Coronary artery disease Paternal Grandfather    History  Substance Use Topics  . Smoking status: Current Some Day Smoker -- 0.50 packs/day for 5 years    Types: Cigarettes  . Smokeless tobacco: Never Used  . Alcohol Use: No   OB History   Grav Para Term  Preterm Abortions TAB SAB Ect Mult Living   2 1 1  1  1   1      Review of Systems  Constitutional: Negative for fever.  Respiratory: Positive for shortness of breath.   Gastrointestinal: Negative for abdominal pain.  All other systems reviewed and are negative.    Allergies  Gabapentin; Codeine; Topamax; Latex; Metoclopramide hcl; and Tape  Home Medications   Current Outpatient Rx  Name  Route  Sig  Dispense  Refill  . amphetamine-dextroamphetamine (ADDERALL XR) 30 MG 24 hr capsule   Oral   Take 1 capsule (30 mg total) by mouth every morning.   30 capsule   0   . clonazePAM (KLONOPIN) 0.5 MG tablet   Oral   Take 1 tablet (0.5 mg total) by mouth 2 (two) times daily as needed for anxiety.   30 tablet   4   .  nortriptyline (PAMELOR) 25 MG capsule   Oral   Take 1 capsule (25 mg total) by mouth daily with lunch.   30 capsule   0   . nystatin-triamcinolone (MYCOLOG II) cream   Topical   Apply 1 application topically 2 (two) times daily as needed (for itching).   30 g   6   . oxyCODONE-acetaminophen (ROXICET) 5-325 MG per tablet   Oral   Take 1 tablet by mouth every 6 (six) hours as needed for pain.   15 tablet   0   . Prenatal Vit-Fe Fumarate-FA (PRENATAL MULTIVITAMIN) TABS   Oral   Take 1 tablet by mouth every morning.         . promethazine (PHENERGAN) 25 MG tablet   Oral   Take 1 tablet (25 mg total) by mouth every 6 (six) hours as needed for nausea.   22 tablet   3   . tiZANidine (ZANAFLEX) 4 MG tablet   Oral   Take 1 tablet (4 mg total) by mouth every 8 (eight) hours as needed (for pain).   30 tablet   1   . zolpidem (AMBIEN) 5 MG tablet   Oral   Take 5 mg by mouth at bedtime.          BP 121/65  Pulse 95  Temp(Src) 97.1 F (36.2 C) (Oral)  Resp 26  Ht 5\' 6"  (1.676 m)  Wt 184 lb (83.462 kg)  BMI 29.71 kg/m2  SpO2 100%  LMP 07/15/2013 Physical Exam  Nursing note and vitals reviewed. Constitutional: She is oriented to person, place, and time. She appears well-developed and well-nourished. No distress.  Patient is a 27 year old female who appears anxious. She is hyperventilating there  HENT:  Head: Normocephalic and atraumatic.  Neck: Normal range of motion. Neck supple.  Cardiovascular: Normal rate and regular rhythm.  Exam reveals no gallop and no friction rub.   No murmur heard. Pulmonary/Chest: Effort normal and breath sounds normal. No respiratory distress. She has no wheezes.  She is hyperventilating, however her lungs are clear with the exception of transmitted upper airways sounds. There is no stridor  Abdominal: Soft. Bowel sounds are normal. She exhibits no distension. There is no tenderness.  Musculoskeletal: Normal range of motion.   Neurological: She is alert and oriented to person, place, and time.  Skin: Skin is warm and dry. She is not diaphoretic.    ED Course  Procedures (including critical care time) Labs Review Labs Reviewed - No data to display Imaging Review No results found.  MDM  No diagnosis found. Patient presents  here with complaints of shortness of breath started this morning. She appears very anxious and is hyperventilating upon presentation. Her lungs are clear, there is no stridor, and her oxygen saturations are 100%. She was given an albuterol treatment which seemed to help somewhat. Her chest x-ray is unremarkable. I suspect these symptoms are most likely anxiety related. I strongly doubt pulmonary embolism. She will be discharged to home with an MDI and we will give things time. She is to return if she worsens and followup with her doctor if not improving in the near future.    Geoffery Lyons, MD 08/12/13 [id number]

## 2013-08-13 ENCOUNTER — Encounter (HOSPITAL_COMMUNITY): Payer: Self-pay | Admitting: Adult Health

## 2013-08-13 ENCOUNTER — Emergency Department (HOSPITAL_COMMUNITY)
Admission: EM | Admit: 2013-08-13 | Discharge: 2013-08-13 | Disposition: A | Discharge status: Home / Self Care | Payer: 59 | Attending: Emergency Medicine | Admitting: Emergency Medicine

## 2013-08-13 DIAGNOSIS — Z9884 Bariatric surgery status: Secondary | ICD-10-CM | POA: Diagnosis not present

## 2013-08-13 DIAGNOSIS — Z3202 Encounter for pregnancy test, result negative: Secondary | ICD-10-CM | POA: Diagnosis not present

## 2013-08-13 DIAGNOSIS — R51 Headache: Secondary | ICD-10-CM | POA: Insufficient documentation

## 2013-08-13 DIAGNOSIS — Z79899 Other long term (current) drug therapy: Secondary | ICD-10-CM | POA: Diagnosis not present

## 2013-08-13 DIAGNOSIS — Z91040 Latex allergy status: Secondary | ICD-10-CM | POA: Diagnosis not present

## 2013-08-13 DIAGNOSIS — D509 Iron deficiency anemia, unspecified: Secondary | ICD-10-CM | POA: Diagnosis not present

## 2013-08-13 DIAGNOSIS — G43909 Migraine, unspecified, not intractable, without status migrainosus: Secondary | ICD-10-CM | POA: Insufficient documentation

## 2013-08-13 DIAGNOSIS — R509 Fever, unspecified: Secondary | ICD-10-CM | POA: Diagnosis not present

## 2013-08-13 DIAGNOSIS — J069 Acute upper respiratory infection, unspecified: Secondary | ICD-10-CM | POA: Insufficient documentation

## 2013-08-13 DIAGNOSIS — F329 Major depressive disorder, single episode, unspecified: Secondary | ICD-10-CM | POA: Insufficient documentation

## 2013-08-13 DIAGNOSIS — Z8742 Personal history of other diseases of the female genital tract: Secondary | ICD-10-CM | POA: Insufficient documentation

## 2013-08-13 DIAGNOSIS — F17200 Nicotine dependence, unspecified, uncomplicated: Secondary | ICD-10-CM | POA: Diagnosis not present

## 2013-08-13 DIAGNOSIS — F3289 Other specified depressive episodes: Secondary | ICD-10-CM | POA: Insufficient documentation

## 2013-08-13 DIAGNOSIS — K589 Irritable bowel syndrome without diarrhea: Secondary | ICD-10-CM | POA: Diagnosis not present

## 2013-08-13 DIAGNOSIS — G8929 Other chronic pain: Secondary | ICD-10-CM | POA: Diagnosis not present

## 2013-08-13 DIAGNOSIS — R0602 Shortness of breath: Secondary | ICD-10-CM | POA: Diagnosis present

## 2013-08-13 MED ORDER — KETOROLAC TROMETHAMINE 30 MG/ML IJ SOLN
30.0000 mg | Freq: Once | INTRAMUSCULAR | Status: AC
  Administered 2013-08-13: 30 mg via INTRAVENOUS
  Filled 2013-08-13: qty 1

## 2013-08-13 MED ORDER — CLONAZEPAM 0.5 MG PO TABS
0.5000 mg | ORAL_TABLET | Freq: Once | ORAL | Status: AC
  Administered 2013-08-13: 0.5 mg via ORAL
  Filled 2013-08-13: qty 1

## 2013-08-13 MED ORDER — LORAZEPAM 2 MG/ML IJ SOLN
1.0000 mg | Freq: Once | INTRAMUSCULAR | Status: AC
  Administered 2013-08-13: 1 mg via INTRAVENOUS
  Filled 2013-08-13: qty 1

## 2013-08-13 MED ORDER — LORAZEPAM 1 MG PO TABS
1.0000 mg | ORAL_TABLET | Freq: Once | ORAL | Status: DC
  Filled 2013-08-13: qty 1

## 2013-08-13 MED ORDER — ALBUTEROL SULFATE HFA 108 (90 BASE) MCG/ACT IN AERS
2.0000 | INHALATION_SPRAY | Freq: Once | RESPIRATORY_TRACT | Status: AC
  Administered 2013-08-13: 2 via RESPIRATORY_TRACT
  Filled 2013-08-13: qty 6.7

## 2013-08-13 NOTE — ED Notes (Signed)
Pt presents with shortness of breath. States last night she received a breathing treatment and an inhaler and was coughing up brown sputum. Pt sats 100% on room air, breath sounds clear to all lobes bilaterally. Respirations tachypnea at 22. Pt HOB elevated, sitting straight up. Pt having difficulty speaking without getting increased breathing.

## 2013-08-13 NOTE — ED Notes (Signed)
MD at bedside. 

## 2013-08-13 NOTE — ED Notes (Signed)
Presents with SOB, tachycardia, productive cough began last week and has progressed, pt was seen last night and treated. Today pt SOB, tachycardic HR 130s, pale, associated with chest heaviness. Pt tachypneic 28

## 2013-08-13 NOTE — ED Notes (Signed)
Brought patient a bedside toilet.

## 2013-08-13 NOTE — ED Provider Notes (Signed)
CSN: [id number]     Arrival date & time 08/13/13  1903 History   First MD Initiated Contact with Patient 08/13/13 1923     Chief Complaint  Patient presents with  . Shortness of Breath   (Consider location/radiation/quality/duration/timing/severity/associated sxs/prior Treatment) Patient is a 27 y.o. female presenting with shortness of breath. The history is provided by the patient.  Shortness of Breath Severity:  Moderate Onset quality:  Gradual Duration: 2. Timing:  Constant Progression:  Worsening Chronicity:  New Context comment:  Patient reports working 6 days in a row and difficulty with sleeping lately Relieved by:  Inhaler Associated symptoms: fever (She reports a fever on Saturday) and headaches (Since Saturday)   Associated symptoms: no chest pain, no neck pain, no rash, no sore throat, no syncope and no vomiting   Risk factors: tobacco use (None for a week)   Risk factors: no hx of PE/DVT and no oral contraceptive use     Past Medical History  Diagnosis Date  . Migraines   . Interstitial cystitis   . IUD FEB 2010  . Gastritis JULY 2011  . Depression   . 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 Fatty liver   . Obesity (BMI 30-39.9) 2011 228 LBS BMI 36.8  . Iron deficiency anemia 07/23/2010  . Irritable bowel syndrome 2012 DIARRHEA    JUN 2012 TTG IgA 14.9  . Elevated liver enzymes JUL 2011ALK PHOS 111-127 AST  143-267 ALT  213-321T BILI 0.6  ALB  3.7-4.06 Jun 2011 ALK PHOS 118 AST 24 ALT 42 T BILI 0.4 ALB 3.9  . Chronic daily headache   . Ovarian cyst   . PONV (postoperative nausea and vomiting)   . ADD (attention deficit disorder)   . RLQ abdominal pain 07/18/2013  . Menorrhagia 07/18/2013   Past Surgical History  Procedure Laterality Date  . Gab  2007    in [place]-POUCH 5 CM  . Cholecystectomy  2005    biliary dyskinesia  . Cath self every nite      for sodium bicarb injection (discontinued 2013)  . Colonoscopy  JUN 2012 ABD PN/DIARRHEA WITH  PROPOFOL    NL COLON  . Upper gastrointestinal endoscopy  JULY 2011 NAUSEA-D125,V6, PH 25    Bx; GASTRITIS, POUCH-5 CM LONG  . Dilation and curettage of uterus    . Tonsillectomy    . Gastric bypass  06/2006  . Savory dilation  06/20/2012    Procedure: SAVORY DILATION;  Surgeon: West Bali, MD;  Location: [facility];  Service: Endoscopy;  Laterality: N/A;  16 savory  . Tonsillectomy and adenoidectomy    . Esophagogastroduodenoscopy     Family History  Problem Relation Age of Onset  . Hemochromatosis Maternal Grandmother   . Migraines Maternal Grandmother   . Cancer Maternal Grandmother   . Hypertension Father   . Diabetes Father   . Coronary artery disease Father   . Migraines Paternal Grandmother   . Cancer Mother     breast  . Hemochromatosis Mother   . Coronary artery disease Paternal Grandfather    History  Substance Use Topics  . Smoking status: Current Some Day Smoker -- 0.50 packs/day for 5 years    Types: Cigarettes  . Smokeless tobacco: Never Used  . Alcohol Use: No   OB History   Grav Para Term Preterm Abortions TAB SAB Ect Mult Living   2 1 1  1   1  1     Review of Systems  Constitutional: Positive for fever (She reports a fever on Saturday). Negative for chills.  HENT: Negative for sore throat, neck pain and neck stiffness.   Respiratory: Positive for shortness of breath. Negative for chest tightness.   Cardiovascular: Negative for chest pain, palpitations and syncope.  Gastrointestinal: Negative for vomiting and diarrhea.  Genitourinary: Negative for dysuria.  Musculoskeletal: Negative for back pain.  Skin: Negative for rash.  Neurological: Positive for headaches (Since Saturday). Negative for weakness, light-headedness and numbness.  All other systems reviewed and are negative.    Allergies  Gabapentin; Codeine; Topamax; Latex; Metoclopramide hcl; and Tape  Home Medications   Current Outpatient Rx  Name  Route  Sig  Dispense  Refill  .  amphetamine-dextroamphetamine (ADDERALL XR) 30 MG 24 hr capsule   Oral   Take 1 capsule (30 mg total) by mouth every morning.   30 capsule   0   . clonazePAM (KLONOPIN) 0.5 MG tablet   Oral   Take 1 tablet (0.5 mg total) by mouth 2 (two) times daily as needed for anxiety.   30 tablet   4   . nortriptyline (PAMELOR) 25 MG capsule   Oral   Take 1 capsule (25 mg total) by mouth daily with lunch.   30 capsule   0   . nystatin-triamcinolone (MYCOLOG II) cream   Topical   Apply 1 application topically 2 (two) times daily as needed (for itching).   30 g   6   . oxyCODONE-acetaminophen (ROXICET) 5-325 MG per tablet   Oral   Take 1 tablet by mouth every 6 (six) hours as needed for pain.   15 tablet   0   . Prenatal Vit-Fe Fumarate-FA (PRENATAL MULTIVITAMIN) TABS   Oral   Take 1 tablet by mouth every morning.         . promethazine (PHENERGAN) 25 MG tablet   Oral   Take 1 tablet (25 mg total) by mouth every 6 (six) hours as needed for nausea.   22 tablet   3   . tiZANidine (ZANAFLEX) 4 MG tablet   Oral   Take 1 tablet (4 mg total) by mouth every 8 (eight) hours as needed (for pain).   30 tablet   1   . zolpidem (AMBIEN) 5 MG tablet   Oral   Take 5 mg by mouth at bedtime.          BP 116/77  Pulse 126  Temp(Src) 98.6 F (37 C) (Oral)  Resp 28  SpO2 100%  LMP 07/15/2013 Physical Exam  Vitals reviewed. Constitutional: She is oriented to person, place, and time. She appears well-developed and well-nourished.  Tearful, anxious appearing  HENT:  Right Ear: External ear normal.  Left Ear: External ear normal.  Mouth/Throat: No oropharyngeal exudate.  Eyes: Conjunctivae and EOM are normal. Pupils are equal, round, and reactive to light.  Neck: Normal range of motion. Neck supple.  Cardiovascular: Regular rhythm, normal heart sounds and intact distal pulses.  Exam reveals no gallop and no friction rub.   No murmur heard. Tachycardic  Pulmonary/Chest: Effort  normal and breath sounds normal.  Tachypnea  Abdominal: Soft. Bowel sounds are normal. She exhibits no distension. There is no tenderness.  Musculoskeletal: Normal range of motion. She exhibits no edema.  Neurological: She is alert and oriented to person, place, and time.  Skin: Skin is warm and dry. No rash noted. She is not diaphoretic.  Psychiatric: She  has a normal mood and affect.    ED Course  Procedures (including critical care time) Labs Review Labs Reviewed  POCT PREGNANCY, URINE   Imaging Review Dg Chest 2 View  08/12/2013   CLINICAL DATA:  Shortness of breath.  EXAM: CHEST  2 VIEW  COMPARISON:  05/04/2013  FINDINGS: The heart size and mediastinal contours are within normal limits. Both lungs are clear. The visualized skeletal structures are unremarkable.  IMPRESSION: No active cardiopulmonary disease.   Electronically Signed   By: Herbie Baltimore   On: 08/12/2013 18:15    Date: 08/13/2013  Rate: 125  Rhythm: sinus tachycardia  QRS Axis: normal  Intervals: QT prolonged borderline  ST/T Wave abnormalities: normal  Conduction Disutrbances:none  Narrative Interpretation: Sinus tachycardia, poor tracing with baseline wander, possible T-wave abnormalities diffusely  Old EKG Reviewed: Rate has increased from EKG 08/13/13, difficult to ascertain other changes due to poor baseline    MDM   27 year old female with past medical history of interstitial cystitis and depression that is prescribed Klonopin (has not taken any in 3 days) that is here for shortness of breath. She reports a fever on Saturday and some congestion since the latter part of last week. She is hyperventilating in the exam room and became tearful during the interview; however, her breath sounds are clear in all lung fields. She has no evidence of upper airway infection. Given the fact that she has not taken her Klonopin in several days is felt this is likely anxiety related. She also stopped smoking 5-6 days ago. 1 mg  of Ativan was ordered. She also felt like the albuterol she received yesterday helped. 2 puffs albuterol ordered.  10:50 PM The patient reports feeling much better after the Ativan and puffs of albuterol. Her heart rate has improved into the 80s. Her lungs are clear. She will be given a dose of her home Klonopin and Toradol prior to discharge. Return precautions were reviewed. It is felt that she likely has a viral URI with some anxiety in the setting of her not taking her Klonopin and quitting smoking.  She was counseled to stay home from work tomorrow and get some rest.  Clinical Impression: 1. URI (upper respiratory infection)     Disposition: Discharge  Condition: Good  I have discussed the results, Dx and Tx plan. They expressed understanding and agree with the plan and were told to return to ED with any worsening of condition or concern.    Discharge Medication List as of 08/13/2013 10:49 PM      Follow Up: Babs Sciara, MD [address] [place] [address] [phone]  Schedule an appointment as soon as possible for a visit    Pt seen in conjunction with Dr. Wilkie Aye.  Reine Just. Beverely Pace, MD Emergency Medicine PGY-III [phone]     Oleh Genin, MD 08/14/13 [phone]

## 2013-08-13 NOTE — ED Notes (Signed)
Patient asked for and received two pillows.

## 2013-08-14 ENCOUNTER — Encounter: Payer: Self-pay | Admitting: Gastroenterology

## 2013-08-14 NOTE — ED Provider Notes (Signed)
I saw and evaluated the patient, reviewed the resident's note and I agree with the findings and plan.  This is a 27 yo female who presents with SOB.  Patient presented several days ago with the same complaint.  She had a negative xray at that time and was improved with albuterol.  Patient reports continued and worsening SOB.  When evaluated by resident, noted to be hyperventilating.  Patient was given ativan.  During my evaluation, patient was in no distress and sating 100% on RA.  Patient reports improvement.  Denies fevers.  Breath sounds clear.  She is PERC negative and I have low suspicion for PE.  Patient had isolated fever several days ago but none since.  Reassured the patient that this is likely a viral process causing a URI.  I have low suspicion for PNA and patient recently had a negative chest xray.  Patient's questions were answered and she was reassured.  Patient was given strict return precautions.    Shon Baton, MD 08/14/13 [phone]

## 2013-08-16 ENCOUNTER — Ambulatory Visit: Payer: Self-pay | Admitting: Gastroenterology

## 2013-08-16 ENCOUNTER — Telehealth: Payer: Self-pay | Admitting: Gastroenterology

## 2013-08-16 NOTE — Telephone Encounter (Signed)
Pt was a no show

## 2013-08-21 ENCOUNTER — Other Ambulatory Visit: Payer: Self-pay | Admitting: Adult Health

## 2013-08-21 ENCOUNTER — Emergency Department (HOSPITAL_COMMUNITY)
Admission: EM | Admit: 2013-08-21 | Discharge: 2013-08-21 | Disposition: A | Discharge status: Home / Self Care | Payer: 59 | Attending: Emergency Medicine | Admitting: Emergency Medicine

## 2013-08-21 ENCOUNTER — Emergency Department (HOSPITAL_COMMUNITY): Payer: 59

## 2013-08-21 ENCOUNTER — Other Ambulatory Visit: Payer: Self-pay

## 2013-08-21 ENCOUNTER — Encounter (HOSPITAL_COMMUNITY): Payer: Self-pay | Admitting: Emergency Medicine

## 2013-08-21 DIAGNOSIS — Z975 Presence of (intrauterine) contraceptive device: Secondary | ICD-10-CM | POA: Diagnosis not present

## 2013-08-21 DIAGNOSIS — Z885 Allergy status to narcotic agent status: Secondary | ICD-10-CM | POA: Diagnosis not present

## 2013-08-21 DIAGNOSIS — Z888 Allergy status to other drugs, medicaments and biological substances status: Secondary | ICD-10-CM | POA: Diagnosis not present

## 2013-08-21 DIAGNOSIS — Z91040 Latex allergy status: Secondary | ICD-10-CM | POA: Insufficient documentation

## 2013-08-21 DIAGNOSIS — E876 Hypokalemia: Secondary | ICD-10-CM | POA: Diagnosis not present

## 2013-08-21 DIAGNOSIS — J209 Acute bronchitis, unspecified: Secondary | ICD-10-CM | POA: Insufficient documentation

## 2013-08-21 DIAGNOSIS — Z8719 Personal history of other diseases of the digestive system: Secondary | ICD-10-CM | POA: Diagnosis not present

## 2013-08-21 DIAGNOSIS — Z8669 Personal history of other diseases of the nervous system and sense organs: Secondary | ICD-10-CM | POA: Diagnosis not present

## 2013-08-21 DIAGNOSIS — Z8742 Personal history of other diseases of the female genital tract: Secondary | ICD-10-CM | POA: Diagnosis not present

## 2013-08-21 DIAGNOSIS — F3289 Other specified depressive episodes: Secondary | ICD-10-CM | POA: Insufficient documentation

## 2013-08-21 DIAGNOSIS — Z79899 Other long term (current) drug therapy: Secondary | ICD-10-CM | POA: Diagnosis not present

## 2013-08-21 DIAGNOSIS — F329 Major depressive disorder, single episode, unspecified: Secondary | ICD-10-CM | POA: Diagnosis not present

## 2013-08-21 DIAGNOSIS — D649 Anemia, unspecified: Secondary | ICD-10-CM

## 2013-08-21 DIAGNOSIS — F17200 Nicotine dependence, unspecified, uncomplicated: Secondary | ICD-10-CM | POA: Insufficient documentation

## 2013-08-21 DIAGNOSIS — F988 Other specified behavioral and emotional disorders with onset usually occurring in childhood and adolescence: Secondary | ICD-10-CM | POA: Diagnosis not present

## 2013-08-21 DIAGNOSIS — J40 Bronchitis, not specified as acute or chronic: Secondary | ICD-10-CM

## 2013-08-21 DIAGNOSIS — K589 Irritable bowel syndrome without diarrhea: Secondary | ICD-10-CM | POA: Insufficient documentation

## 2013-08-21 DIAGNOSIS — E669 Obesity, unspecified: Secondary | ICD-10-CM | POA: Insufficient documentation

## 2013-08-21 DIAGNOSIS — R062 Wheezing: Secondary | ICD-10-CM | POA: Insufficient documentation

## 2013-08-21 DIAGNOSIS — R0602 Shortness of breath: Secondary | ICD-10-CM | POA: Diagnosis present

## 2013-08-21 LAB — CBC WITH DIFFERENTIAL/PLATELET
Hemoglobin: 9 g/dL — ABNORMAL LOW (ref 12.0–15.0)
Lymphocytes Relative: 40 % (ref 12–46)
Lymphs Abs: 2.3 10*3/uL (ref 0.7–4.0)
MCH: 23.3 pg — ABNORMAL LOW (ref 26.0–34.0)
Monocytes Relative: 9 % (ref 3–12)
Neutro Abs: 2.9 10*3/uL (ref 1.7–7.7)
Neutrophils Relative %: 50 % (ref 43–77)
Platelets: 330 10*3/uL (ref 150–400)
RBC: 3.87 MIL/uL (ref 3.87–5.11)
WBC: 5.8 10*3/uL (ref 4.0–10.5)

## 2013-08-21 LAB — BASIC METABOLIC PANEL
BUN: 9 mg/dL (ref 6–23)
CO2: 18 mEq/L — ABNORMAL LOW (ref 19–32)
Chloride: 103 mEq/L (ref 96–112)
Glucose, Bld: 93 mg/dL (ref 70–99)
Potassium: 2.6 mEq/L — CL (ref 3.5–5.1)
Sodium: 141 mEq/L (ref 135–145)

## 2013-08-21 MED ORDER — IBUPROFEN 800 MG PO TABS
800.0000 mg | ORAL_TABLET | Freq: Once | ORAL | Status: AC
  Administered 2013-08-21: 800 mg via ORAL
  Filled 2013-08-21: qty 1

## 2013-08-21 MED ORDER — PREDNISONE 20 MG PO TABS: 60.0000 mg | ORAL_TABLET | Freq: Every day | ORAL | Status: DC

## 2013-08-21 MED ORDER — ALBUTEROL SULFATE (5 MG/ML) 0.5% IN NEBU
5.0000 mg | INHALATION_SOLUTION | Freq: Once | RESPIRATORY_TRACT | Status: AC
  Administered 2013-08-21: 5 mg via RESPIRATORY_TRACT
  Filled 2013-08-21: qty 1

## 2013-08-21 MED ORDER — POTASSIUM CHLORIDE CRYS ER 20 MEQ PO TBCR
40.0000 meq | EXTENDED_RELEASE_TABLET | Freq: Once | ORAL | Status: AC
  Administered 2013-08-21: 40 meq via ORAL
  Filled 2013-08-21: qty 2

## 2013-08-21 MED ORDER — POTASSIUM CHLORIDE CRYS ER 20 MEQ PO TBCR
20.0000 meq | EXTENDED_RELEASE_TABLET | Freq: Two times a day (BID) | ORAL | Status: DC
Start: 2013-08-21 — End: 2013-09-18

## 2013-08-21 MED ORDER — PREDNISONE 20 MG PO TABS
60.0000 mg | ORAL_TABLET | Freq: Once | ORAL | Status: AC
  Administered 2013-08-21: 60 mg via ORAL
  Filled 2013-08-21: qty 3

## 2013-08-21 MED ORDER — OXYCODONE-ACETAMINOPHEN 5-325 MG PO TABS
2.0000 | ORAL_TABLET | Freq: Once | ORAL | Status: AC
  Administered 2013-08-21: 2 via ORAL
  Filled 2013-08-21: qty 2

## 2013-08-21 MED ORDER — POTASSIUM CHLORIDE 10 MEQ/100ML IV SOLN
10.0000 meq | INTRAVENOUS | Status: AC
  Administered 2013-08-21 (×2): 10 meq via INTRAVENOUS
  Filled 2013-08-21 (×2): qty 100

## 2013-08-21 NOTE — ED Notes (Signed)
Pt c/o sob since 130 am today, c/o chest tightness, reports she tried her inhaler but didn't get any relief. Reports about 2 weeks ago she was coming into work and was feeling really bad and saw the physician and was given inhaler, was seen at [facility] and had CXR done which was clear, but told her that it could of bee too early to see anything, pt sts she hasn't gotten any better and just feels worse. C/o non-productive cough, c/o nasal drainage - clear color. Pt in nad, skin warm and dry, resp e/u.

## 2013-08-21 NOTE — ED Provider Notes (Signed)
CSN: [id number]     Arrival date & time 08/21/13  0731 History   First MD Initiated Contact with Patient 08/21/13 [phone]     Chief Complaint  Patient presents with  . Shortness of Breath   (Consider location/radiation/quality/duration/timing/severity/associated sxs/prior Treatment) Patient is a 27 y.o. female presenting with shortness of breath.  Shortness of Breath  Pt is 27 year old smoker who has had about 1-2 weeks of SOB and cough. She was seen in the twice ED last week for same and had neg CXR. Given neb with some improvement and so also give inhaler which she has used intermittently without much help. She had a fever once last week but none since. She has felt more SOB during the night last night.  Past Medical History  Diagnosis Date  . Migraines   . Interstitial cystitis   . IUD FEB 2010  . Gastritis JULY 2011  . Depression   . 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 Fatty liver   . Obesity (BMI 30-39.9) 2011 228 LBS BMI 36.8  . Iron deficiency anemia 07/23/2010  . Irritable bowel syndrome 2012 DIARRHEA    JUN 2012 TTG IgA 14.9  . Elevated liver enzymes JUL 2011ALK PHOS 111-127 AST  143-267 ALT  213-321T BILI 0.6  ALB  3.7-4.06 Jun 2011 ALK PHOS 118 AST 24 ALT 42 T BILI 0.4 ALB 3.9  . Chronic daily headache   . Ovarian cyst   . PONV (postoperative nausea and vomiting)   . ADD (attention deficit disorder)   . RLQ abdominal pain 07/18/2013  . Menorrhagia 07/18/2013   Past Surgical History  Procedure Laterality Date  . Gab  2007    in [place]-POUCH 5 CM  . Cholecystectomy  2005    biliary dyskinesia  . Cath self every nite      for sodium bicarb injection (discontinued 2013)  . Colonoscopy  JUN 2012 ABD PN/DIARRHEA WITH PROPOFOL    NL COLON  . Upper gastrointestinal endoscopy  JULY 2011 NAUSEA-D125,V6, PH 25    Bx; GASTRITIS, POUCH-5 CM LONG  . Dilation and curettage of uterus    . Tonsillectomy    . Gastric bypass  06/2006  . Savory dilation  06/20/2012     [facility]:ONGE gastritis/Ulcer in the mid jejunum  . Tonsillectomy and adenoidectomy    . Esophagogastroduodenoscopy     Family History  Problem Relation Age of Onset  . Hemochromatosis Maternal Grandmother   . Migraines Maternal Grandmother   . Cancer Maternal Grandmother   . Hypertension Father   . Diabetes Father   . Coronary artery disease Father   . Migraines Paternal Grandmother   . Cancer Mother     breast  . Hemochromatosis Mother   . Coronary artery disease Paternal Grandfather    History  Substance Use Topics  . Smoking status: Current Some Day Smoker -- 0.50 packs/day for 5 years    Types: Cigarettes  . Smokeless tobacco: Never Used  . Alcohol Use: No   OB History   Grav Para Term Preterm Abortions TAB SAB Ect Mult Living   2 1 1  1  1   1      Review of Systems  Respiratory: Positive for shortness of breath.    All other systems reviewed and are negative except as noted in HPI.   Allergies  Gabapentin; Metoclopramide hcl; Topamax; Codeine; Latex; and Tape  Home Medications   Current Outpatient Rx  Name  Route  Sig  Dispense  Refill  . acetaminophen (TYLENOL) 500 MG tablet   Oral   Take 1,000 mg by mouth every 6 (six) hours as needed for pain.         Marland Kitchen albuterol (PROVENTIL HFA;VENTOLIN HFA) 108 (90 BASE) MCG/ACT inhaler   Inhalation   Inhale 2 puffs into the lungs every 6 (six) hours as needed for wheezing.         Marland Kitchen amphetamine-dextroamphetamine (ADDERALL XR) 30 MG 24 hr capsule   Oral   Take 1 capsule (30 mg total) by mouth every morning.   30 capsule   0   . aspirin-acetaminophen-caffeine (EXCEDRIN MIGRAINE) 250-250-65 MG per tablet   Oral   Take 2 tablets by mouth every 6 (six) hours as needed for pain.         . clonazePAM (KLONOPIN) 0.5 MG tablet   Oral   Take 1 tablet (0.5 mg total) by mouth 2 (two) times daily as needed for anxiety.   30 tablet   4   . ibuprofen (ADVIL,MOTRIN) 200 MG tablet   Oral   Take 600 mg by mouth every 6  (six) hours as needed for pain.         . nortriptyline (PAMELOR) 25 MG capsule   Oral   Take 1 capsule (25 mg total) by mouth daily with lunch.   30 capsule   0   . nystatin-triamcinolone (MYCOLOG II) cream   Topical   Apply 1 application topically 2 (two) times daily as needed (for itching).   30 g   6   . oxyCODONE-acetaminophen (ROXICET) 5-325 MG per tablet   Oral   Take 1 tablet by mouth every 6 (six) hours as needed for pain.   15 tablet   0   . Prenatal Vit-Fe Fumarate-FA (PRENATAL MULTIVITAMIN) TABS   Oral   Take 1 tablet by mouth at bedtime.          . promethazine (PHENERGAN) 25 MG tablet   Oral   Take 1 tablet (25 mg total) by mouth every 6 (six) hours as needed for nausea.   22 tablet   3   . pseudoephedrine (SUDAFED) 30 MG tablet   Oral   Take 30 mg by mouth every 4 (four) hours as needed for congestion.         Marland Kitchen tiZANidine (ZANAFLEX) 4 MG tablet   Oral   Take 1 tablet (4 mg total) by mouth every 8 (eight) hours as needed (for pain).   30 tablet   1   . zolpidem (AMBIEN) 5 MG tablet   Oral   Take 5 mg by mouth at bedtime.          BP 132/79  Temp(Src) 97.7 F (36.5 C) (Oral)  Resp 15  Ht 5\' 6"  (1.676 m)  Wt 184 lb (83.462 kg)  BMI 29.71 kg/m2  SpO2 100%  LMP 08/17/2013 Physical Exam  Nursing note and vitals reviewed. Constitutional: She is oriented to person, place, and time. She appears well-developed and well-nourished.  HENT:  Head: Normocephalic and atraumatic.  Eyes: EOM are normal. Pupils are equal, round, and reactive to light.  Neck: Normal range of motion. Neck supple.  Cardiovascular: Normal rate, normal heart sounds and intact distal pulses.   Pulmonary/Chest: Effort normal. No respiratory distress. She has wheezes. She has no rales.  Transmitted upper airway noise  Abdominal: Bowel sounds are normal. She exhibits no distension. There is no tenderness.  Musculoskeletal: Normal range of motion. She exhibits no edema.   Neurological: She is alert and oriented to person, place, and time. She has normal strength. No cranial nerve deficit or sensory deficit.  Skin: Skin is warm and dry. No rash noted.  Psychiatric: She has a normal mood and affect.    ED Course  Procedures (including critical care time) Labs Review Labs Reviewed  CBC WITH DIFFERENTIAL - Abnormal; Notable for the following:    Hemoglobin 9.0 (*)    HCT 28.1 (*)    MCV 72.6 (*)    MCH 23.3 (*)    RDW 16.9 (*)    All other components within normal limits  BASIC METABOLIC PANEL - Abnormal; Notable for the following:    Potassium 2.6 (*)    CO2 18 (*)    All other components within normal limits   Imaging Review Dg Chest 2 View  08/21/2013   CLINICAL DATA:  Chest pain and shortness of Breath  EXAM: CHEST  2 VIEW  COMPARISON:  August 12, 2013  FINDINGS: Lungs are clear. Heart size and pulmonary vascularity are normal. No adenopathy. No bone lesions.  IMPRESSION: No abnormality noted.   Electronically Signed   By: Bretta Bang   On: 08/21/2013 08:20    MDM   1. Bronchitis   2. Hypokalemia   3. Anemia     Pt with symptoms consistent with bronchitis, persistent over the last 2 weeks. Will check for developing PNA. Neb and steroids for symptom control. She has not had labs checked yet, history of anemia.   ECG interpretation   Date: 08/21/2013  Rate: 78  Rhythm: normal sinus rhythm  QRS Axis: normal  Intervals: normal  ST/T Wave abnormalities: normal  Conduction Disutrbances: none  Narrative Interpretation:   Old EKG Reviewed: No significant changes noted, slower rate  10:05 AM Labs and imaging reviewed. Anemia is about at baseline. K is low, will need repletion orally and IV.    Charles B. Bernette Mayers, MD 08/21/13 1244

## 2013-09-06 ENCOUNTER — Telehealth: Payer: Self-pay | Admitting: Family Medicine

## 2013-09-06 MED ORDER — OXYCODONE-ACETAMINOPHEN 5-325 MG PO TABS: 1.0000 | ORAL_TABLET | Freq: Four times a day (QID) | ORAL | Status: DC | PRN

## 2013-09-06 MED ORDER — NORTRIPTYLINE HCL 25 MG PO CAPS: 25.0000 mg | ORAL_CAPSULE | Freq: Two times a day (BID) | ORAL | Status: DC

## 2013-09-06 NOTE — Telephone Encounter (Signed)
[facility] to notify pt. Percocet ready for pick up. May increase nortryptline to one bid. rx sent to pharm. And to continue ov q 3 months for ADD.

## 2013-09-06 NOTE — Telephone Encounter (Signed)
Percocet 5/325, #8, one q 6 hours prn pain /migraine, NOT FOR FREQUENT USE,   May increase nortryptyline to one bid  Continue OV q 3 months for ADD

## 2013-09-06 NOTE — Telephone Encounter (Signed)
Discussed with pt

## 2013-09-06 NOTE — Telephone Encounter (Signed)
oxyCODONE-acetaminophen (ROXICET) 5-325 MG per tablet nortriptyline (PAMELOR) 25 MG capsule pt would like to know if she can get this one upped to BID   Pt needs a refill on these particular meds please

## 2013-09-17 ENCOUNTER — Telehealth: Payer: Self-pay

## 2013-09-17 ENCOUNTER — Telehealth: Payer: Self-pay | Admitting: Adult Health

## 2013-09-17 ENCOUNTER — Telehealth: Payer: Self-pay | Admitting: Family Medicine

## 2013-09-17 NOTE — Telephone Encounter (Signed)
Patient says she wants a nurse to call her back regarding the Rx that she was recently wrote for. She said it was wrote wrong.

## 2013-09-17 NOTE — Telephone Encounter (Signed)
It should be noted that on previous office visit patient was given 10 Percocet. She uses these for musculoskeletal headaches. Recently she had called in we gave her 8 which upset her. I am trying to be judicious in how many weekend because this patient at one time had issues with pain medication over use. I do not want her to fall back into that pattern. Offer was made to refer her to pain management if she desires she did not want to. I do not believe the patient is trying to abuse medication but at the same time we must be careful with prescribing habits

## 2013-09-17 NOTE — Telephone Encounter (Signed)
Left message on voicemail to return call.

## 2013-09-17 NOTE — Telephone Encounter (Signed)
Patient notified that Dr. Lorin Picket only approved her to have #8 percocet not #15 per him. Offered to refer her to a specialist or pain management and patient declined.

## 2013-09-17 NOTE — Telephone Encounter (Signed)
Left message I called 

## 2013-09-17 NOTE — Telephone Encounter (Signed)
Please see previous phone message.  

## 2013-09-18 ENCOUNTER — Encounter (INDEPENDENT_AMBULATORY_CARE_PROVIDER_SITE_OTHER): Payer: Self-pay

## 2013-09-18 ENCOUNTER — Ambulatory Visit (INDEPENDENT_AMBULATORY_CARE_PROVIDER_SITE_OTHER): Payer: 59 | Admitting: Adult Health

## 2013-09-18 ENCOUNTER — Encounter: Payer: Self-pay | Admitting: Adult Health

## 2013-09-18 VITALS — BP 120/80 | Ht 66.0 in | Wt 189.0 lb

## 2013-09-18 DIAGNOSIS — Z319 Encounter for procreative management, unspecified: Secondary | ICD-10-CM

## 2013-09-18 DIAGNOSIS — F439 Reaction to severe stress, unspecified: Secondary | ICD-10-CM

## 2013-09-18 DIAGNOSIS — N946 Dysmenorrhea, unspecified: Secondary | ICD-10-CM

## 2013-09-18 DIAGNOSIS — Z733 Stress, not elsewhere classified: Secondary | ICD-10-CM

## 2013-09-18 HISTORY — DX: Reaction to severe stress, unspecified: F43.9

## 2013-09-18 HISTORY — DX: Encounter for procreative management, unspecified: Z31.9

## 2013-09-18 HISTORY — DX: Dysmenorrhea, unspecified: N94.6

## 2013-09-18 NOTE — Assessment & Plan Note (Signed)
Given number to Dr Emerson Monte to call and set up appt to talk

## 2013-09-18 NOTE — Patient Instructions (Signed)
Dysmenorrhea  Menstrual pain is caused by the muscles of the uterus tightening (contracting) during a menstrual period. The muscles of the uterus contract due to the chemicals in the uterine lining.  Primary dysmenorrhea is menstrual cramps that last a couple of days when you start having menstrual periods or soon after. This often begins after a teenager starts having her period. As a woman gets older or has a baby, the cramps will usually lesson or disappear.  Secondary dysmenorrhea begins later in life, lasts longer, and the pain may be stronger than primary dysmenorrhea. The pain may start before the period and last a few days after the period. This type of dysmenorrhea is usually caused by an underlying problem such as:   The tissue lining the uterus grows outside of the uterus in other areas of the body (endometriosis).   The endometrial tissue, which normally lines the uterus, is found in or grows into the muscular walls of the uterus (adenomyosis).   The pelvic blood vessels are engorged with blood just before the menstrual period (pelvic congestive syndrome).   Overgrowth of cells in the lining of the uterus or cervix (polyps of the uterus or cervix).   Falling down of the uterus (prolapse) because of loose or stretched ligaments.   Depression.   Bladder problems, infection, or inflammation.   Problems with the intestine, a tumor, or irritable bowel syndrome.   Cancer of the female organs or bladder.   A severely tipped uterus.   A very tight opening or closed cervix.   Noncancerous tumors of the uterus (fibroids).   Pelvic inflammatory disease (PID).   Pelvic scarring (adhesions) from a previous surgery.   Ovarian cyst.   An intrauterine device (IUD) used for birth control.  CAUSES   The cause of menstrual pain is often unknown.  SYMPTOMS    Cramping or throbbing pain in your lower abdomen.   Sometimes, a woman may also experience headaches.   Lower back pain.   Feeling sick to your  stomach (nausea) or vomiting.   Diarrhea.   Sweating or dizziness.  DIAGNOSIS   A diagnosis is based on your history, symptoms, physical examination, diagnostic tests, or procedures. Diagnostic tests or procedures may include:   Blood tests.   An ultrasound.   An examination of the lining of the uterus (dilation and curettage, D&C).   An examination inside your abdomen or pelvis with a scope (laparoscopy).   X-rays.   CT Scan.   MRI.   An examination inside the bladder with a scope (cystoscopy).   An examination inside the intestine or stomach with a scope (colonoscopy, gastroscopy).  TREATMENT   Treatment depends on the cause of the dysmenorrhea. Treatment may include:   Pain medicine prescribed by your caregiver.   Birth control pills.   Hormone replacement therapy.   Nonsteroidal anti-inflammatory drugs (NSAIDs). These may help stop the production of prostaglandins.   An IUD with progesterone hormone in it.   Acupuncture.   Surgery to remove adhesions, endometriosis, ovarian cyst, or fibroids.   Removal of the uterus (hysterectomy).   Progesterone shots to stop the menstrual period.   Cutting the nerves on the sacrum that go to the female organs (presacral neurectomy).   Electric currant to the sacral nerves (sacral nerve stimulation).   Antidepressant medicine.   Psychiatric therapy, counseling, or group therapy.   Exercise and physical therapy.   Meditation and yoga therapy.  HOME CARE INSTRUCTIONS    Only take over-the-counter   may help.  Stop smoking.  Avoid alcohol and caffeine.  Yoga, meditation, or acupuncture may help. SEEK MEDICAL CARE IF:    The pain does not get better with medicine.  You have pain with sexual intercourse. SEEK IMMEDIATE MEDICAL CARE IF:   Your pain increases and is not controlled with medicines.  You have a fever.  You develop nausea or vomiting with your period not controlled with medicine.  You have abnormal vaginal bleeding with your period.  You pass out. MAKE SURE YOU:   Understand these instructions.  Will watch your condition.  Will get help right away if you are not doing well or get worse. Document Released: 11/22/2005 Document Revised: 02/14/2012 Document Reviewed: 03/10/2009 [facility] Patient Information 2014 Penny [name], [place]. Get labs November 1 Call Dr Nolen Mu Follow up in 4 weeks

## 2013-09-18 NOTE — Progress Notes (Signed)
Subjective:     Patient ID: Penny [name], female   DOB: 09/23/1986, 27 y.o.   MRN: [id number]  HPI Deven is a 27 year old white female married, in today complaining of painful periods, not getting pregnant, still not sleeping well even with Ambien, recent panic attacks, recent bronchitis and stressed over parents separation.She is teary.Her headaches are better.  Review of Systems See HPI Reviewed past medical,surgical, social and family history. Reviewed medications and allergies.     Objective:   Physical Exam BP 120/80  Ht 5\' 6"  (1.676 m)  Wt 189 lb (85.73 kg)  BMI 30.52 kg/m2  LMP 09/16/2013   Discussed that it could take up to 18 months to get pregnant, and that I would not take pain meds other than tylenol or advil for pain while trying to get pregnant.I told her to call Dr Emerson Monte and set up appt to talk and she may could manage any sleep, anxiety type meds.Will check progesterone level November 1 and TSH,discussed may get semen analysis in future, her husband had gastric bypass surgery and has lost from size 54 pants to 44 pants.  Assessment:     Dysmenorrhea Stress Desires pregnancy    Plan:     Progesterone level and TSH November 1 Call Dr Nolen Mu Follow up in 4 weeks

## 2013-09-20 ENCOUNTER — Telehealth: Payer: Self-pay | Admitting: Family Medicine

## 2013-09-20 NOTE — Telephone Encounter (Signed)
error 

## 2013-09-21 ENCOUNTER — Telehealth: Payer: Self-pay | Admitting: Family Medicine

## 2013-09-21 NOTE — Telephone Encounter (Signed)
Pt states she is having more panic attacks and taking klonopin 0.5 mg TID. Appt with specialist is in one month. Consult with Dr. Lorin Picket ok to call in #90 one TID 0 refills. Pt notified med called into [place] [facility].

## 2013-09-21 NOTE — Telephone Encounter (Signed)
The nurse spoke with the patient. She is seeing her psychiatrist later in the month. Having more panic attacks. Is using the medication 3 times a day. We called in a prescription for 90 tablets one 3 times a day as directed. She will be seeing her psychiatrist within the next few weeks.

## 2013-09-21 NOTE — Telephone Encounter (Signed)
Patient came in yesterday wanting to speak with you . The front explained you were out of the office.Patient called today wanting a prescription for Klonopin 0.5 mg. She states cant get appointment with specialist for a month but she also wants you to increase dosage.

## 2013-09-21 NOTE — Telephone Encounter (Signed)
Patient called again stating that she cant see psychiatrist until [facility] states that panic and anxiety attacks are getting worse. She is having multiple a day. Patient wants to know if she can have her dose increased until her next appointment with our office?

## 2013-09-28 ENCOUNTER — Other Ambulatory Visit: Payer: Self-pay | Admitting: Family Medicine

## 2013-10-09 ENCOUNTER — Ambulatory Visit (INDEPENDENT_AMBULATORY_CARE_PROVIDER_SITE_OTHER): Payer: 59 | Admitting: Obstetrics & Gynecology

## 2013-10-09 ENCOUNTER — Encounter: Payer: Self-pay | Admitting: Obstetrics & Gynecology

## 2013-10-09 VITALS — BP 120/70 | Ht 66.0 in | Wt 188.0 lb

## 2013-10-09 DIAGNOSIS — R300 Dysuria: Secondary | ICD-10-CM

## 2013-10-09 LAB — POCT URINALYSIS DIPSTICK
Blood, UA: NEGATIVE
Glucose, UA: NEGATIVE

## 2013-10-09 MED ORDER — CYCLOBENZAPRINE HCL 10 MG PO TABS: 10.0000 mg | ORAL_TABLET | Freq: Three times a day (TID) | ORAL | Status: DC | PRN

## 2013-10-09 MED ORDER — FLUOCINONIDE-E 0.05 % EX CREA: 1.0000 "application " | TOPICAL_CREAM | Freq: Two times a day (BID) | CUTANEOUS | Status: DC

## 2013-10-09 NOTE — Progress Notes (Signed)
Patient ID: Penny [name], female   DOB: 31-Jan-1986, 27 y.o.   MRN: [id number] Pt has back pain since 4 days no abnormal bleeding Having discharge as well with burning  Urine is clear  Wet prep negative no yeast bv or trich  + triggers on right paraspinous muscles  Flexeril prn TID Lidex bid to vulva

## 2013-10-15 ENCOUNTER — Telehealth: Payer: Self-pay | Admitting: Obstetrics & Gynecology

## 2013-10-15 NOTE — Telephone Encounter (Signed)
Pt states that muscle relaxer and cream not helping. Pt states that she woke last night from sleep due to burning and back pain and painful urination, Pt states that there is a lot of pressure in lower abdomin. Pt states that she hurts really bad.

## 2013-10-16 ENCOUNTER — Ambulatory Visit: Payer: 59 | Admitting: Adult Health

## 2013-10-23 ENCOUNTER — Encounter: Payer: Self-pay | Admitting: Obstetrics & Gynecology

## 2013-10-23 ENCOUNTER — Ambulatory Visit (INDEPENDENT_AMBULATORY_CARE_PROVIDER_SITE_OTHER): Payer: 59 | Admitting: Obstetrics & Gynecology

## 2013-10-23 VITALS — BP 120/60 | Wt 195.0 lb

## 2013-10-23 DIAGNOSIS — N3010 Interstitial cystitis (chronic) without hematuria: Secondary | ICD-10-CM | POA: Insufficient documentation

## 2013-10-23 MED ORDER — FLUCONAZOLE 100 MG PO TABS: 100.0000 mg | ORAL_TABLET | Freq: Every day | ORAL | Status: DC

## 2013-10-23 MED ORDER — PENTOSAN POLYSULFATE SODIUM 100 MG PO CAPS: ORAL_CAPSULE | ORAL | Status: DC

## 2013-10-23 MED ORDER — TRIAMCINOLONE ACETONIDE 0.5 % EX OINT: 1.0000 "application " | TOPICAL_OINTMENT | Freq: Two times a day (BID) | CUTANEOUS | Status: DC

## 2013-10-23 MED ORDER — HYDROCODONE-ACETAMINOPHEN 5-325 MG PO TABS: 1.0000 | ORAL_TABLET | Freq: Four times a day (QID) | ORAL | Status: DC | PRN

## 2013-10-24 LAB — URINALYSIS
Bilirubin Urine: NEGATIVE
Glucose, UA: NEGATIVE mg/dL
Leukocytes, UA: NEGATIVE
Protein, ur: NEGATIVE mg/dL
Specific Gravity, Urine: <1.005 — ABNORMAL LOW (ref 1.005–1.030)
Urobilinogen, UA: 0.2 mg/dL (ref 0.0–1.0)

## 2013-10-25 LAB — URINE CULTURE
Colony Count: NO GROWTH
Organism ID, Bacteria: NO GROWTH

## 2013-10-30 ENCOUNTER — Other Ambulatory Visit: Payer: Self-pay | Admitting: Family Medicine

## 2013-10-30 ENCOUNTER — Other Ambulatory Visit: Payer: Self-pay | Admitting: Adult Health

## 2013-10-30 NOTE — Telephone Encounter (Signed)
Refill x2, patient will need followup office visit in January/February

## 2013-10-31 ENCOUNTER — Other Ambulatory Visit: Payer: Self-pay | Admitting: *Deleted

## 2013-11-06 ENCOUNTER — Encounter: Payer: Self-pay | Admitting: Obstetrics & Gynecology

## 2013-11-06 ENCOUNTER — Ambulatory Visit (INDEPENDENT_AMBULATORY_CARE_PROVIDER_SITE_OTHER): Payer: 59 | Admitting: Obstetrics & Gynecology

## 2013-11-06 VITALS — BP 116/80 | Ht 66.0 in | Wt 195.8 lb

## 2013-11-06 DIAGNOSIS — N3010 Interstitial cystitis (chronic) without hematuria: Secondary | ICD-10-CM

## 2013-11-06 MED ORDER — URELLE 81 MG PO TABS: 1.0000 | ORAL_TABLET | Freq: Four times a day (QID) | ORAL | Status: DC

## 2013-11-06 MED ORDER — HYDROCODONE-ACETAMINOPHEN 5-325 MG PO TABS: 1.0000 | ORAL_TABLET | Freq: Four times a day (QID) | ORAL | Status: DC | PRN

## 2013-11-06 NOTE — Progress Notes (Signed)
Patient ID: Penny [name], female   DOB: [birth year], 27 y.o.   MRN: [id number] 2nd instillation in this series DMSO + marcaine + elmiron  Pain not as bad today, but still present   Recommend re instillation in 2 weeks  Follow up then

## 2013-11-06 NOTE — Addendum Note (Signed)
Addended by: Lazaro Arms on: 11/06/2013 12:20 PM   Modules accepted: Orders

## 2013-11-06 NOTE — Progress Notes (Signed)
Patient ID: Penny [name], female   DOB: [birth year], 27 y.o.   MRN: [id number] Pt flaring with IC symptoms again, urine not infected  DMSO irrigation done with marcaine and elmiron  Follow u in 2 weeks for continued evaluations and instillataion

## 2013-11-09 ENCOUNTER — Ambulatory Visit: Payer: 59 | Admitting: Family Medicine

## 2013-11-16 ENCOUNTER — Ambulatory Visit (INDEPENDENT_AMBULATORY_CARE_PROVIDER_SITE_OTHER): Payer: 59 | Admitting: Family Medicine

## 2013-11-16 ENCOUNTER — Encounter: Payer: Self-pay | Admitting: Family Medicine

## 2013-11-16 VITALS — BP 118/70 | Temp 98.8°F | Ht 66.0 in | Wt 195.0 lb

## 2013-11-16 DIAGNOSIS — F988 Other specified behavioral and emotional disorders with onset usually occurring in childhood and adolescence: Secondary | ICD-10-CM

## 2013-11-16 DIAGNOSIS — Z79899 Other long term (current) drug therapy: Secondary | ICD-10-CM

## 2013-11-16 DIAGNOSIS — D649 Anemia, unspecified: Secondary | ICD-10-CM

## 2013-11-16 DIAGNOSIS — IMO0001 Reserved for inherently not codable concepts without codable children: Secondary | ICD-10-CM

## 2013-11-16 LAB — BASIC METABOLIC PANEL
Calcium: 9.2 mg/dL (ref 8.4–10.5)
Creat: 0.83 mg/dL (ref 0.50–1.10)
Sodium: 139 mEq/L (ref 135–145)

## 2013-11-16 LAB — MAGNESIUM: Magnesium: 2.1 mg/dL (ref 1.5–2.5)

## 2013-11-16 LAB — CBC WITH DIFFERENTIAL/PLATELET
Basophils Relative: 0 % (ref 0–1)
Eosinophils Absolute: 0.1 10*3/uL (ref 0.0–0.7)
Hemoglobin: 7.3 g/dL — ABNORMAL LOW (ref 12.0–15.0)
MCH: 22.1 pg — ABNORMAL LOW (ref 26.0–34.0)
MCHC: 30.7 g/dL (ref 30.0–36.0)
Monocytes Relative: 24 % — ABNORMAL HIGH (ref 3–12)
Neutro Abs: 1.7 10*3/uL (ref 1.7–7.7)
Neutrophils Relative %: 43 % (ref 43–77)
Platelets: 275 10*3/uL (ref 150–400)
RBC: 3.3 MIL/uL — ABNORMAL LOW (ref 3.87–5.11)

## 2013-11-16 LAB — FERRITIN: Ferritin: 1 ng/mL — ABNORMAL LOW (ref 10–291)

## 2013-11-16 MED ORDER — METHYLPHENIDATE HCL ER (OSM) 54 MG PO TBCR: 54.0000 mg | EXTENDED_RELEASE_TABLET | Freq: Every day | ORAL | Status: DC

## 2013-11-16 MED ORDER — AMOXICILLIN 200 MG/5ML PO SUSR: ORAL | Status: DC

## 2013-11-16 MED ORDER — CLONAZEPAM 0.5 MG PO TABS: ORAL_TABLET | ORAL | Status: DC

## 2013-11-16 MED ORDER — FLUCONAZOLE 150 MG PO TABS: ORAL_TABLET | ORAL | Status: DC

## 2013-11-16 MED ORDER — AMOXICILLIN 500 MG PO TABS: 500.0000 mg | ORAL_TABLET | Freq: Three times a day (TID) | ORAL | Status: DC

## 2013-11-16 NOTE — Progress Notes (Signed)
   Subjective:    Patient ID: Penny [name], female    DOB: 05-04-1986, 27 y.o.   MRN: [id number]  HPIFollow up on meds.  ADD-doesn't feel Adderall is helping anymore would like to try something different.  Panic attacks, anxiuos not depressed. Not doing counseling. She relates her last counselor was not on her insurance list. She denies being depressed. Not suicidal.   Migraines - pamelor helping. She tries to minimize stress levels. She occasionally has to use Percocet for her migraines she does not use it frequently and we have told her that we will not prescribe it frequently because of risk of addiction   Sore throat, sinus pressure, and congestion started 4-5 days ago. Denies wheezing shortness of breath.   ICS- Dr Despina Hidden treating with DMO    Review of Systems Negative for vomiting diarrhea positive for congestion sinus pressure negative for wheezing negative for abdominal pain positive for occasional migraines    Objective:   Physical Exam  Eardrums normal, throat normal sinus mild tenderness neck is supple lungs are clear heart is regular extremities no edema      Assessment & Plan:  #1 panic attacks-unfortunately cannot add SSRI because patient is on Pamelor for her migraines. I believe the best course of action would be for her to do some counseling I encouraged her to contact her insurance to get herself set up with a counselor who is approved by her insurance  #2 sinusitis amoxicillin 10 days as directed warnings discussed in  #3 interstitial cystitis continue with Dr. your  #4 migraines under good control currently uses Percocet infrequently  #5 ADD she doesn't feel Adderall is helping so therefore I would recommend trying Concerta 54 mg daily. If this is doing well over the next month she will call us back and we will issue 2 additional prescriptions then she will followup in 3 months  #6 she may continue to use nerve medication for her panic attacks use  sparingly but she averages 2-3 daily

## 2013-11-19 ENCOUNTER — Encounter: Payer: Self-pay | Admitting: Family Medicine

## 2013-11-19 ENCOUNTER — Ambulatory Visit (INDEPENDENT_AMBULATORY_CARE_PROVIDER_SITE_OTHER): Payer: 59 | Admitting: Family Medicine

## 2013-11-19 VITALS — BP 118/80 | Temp 98.8°F | Ht 66.0 in | Wt 193.0 lb

## 2013-11-19 DIAGNOSIS — J019 Acute sinusitis, unspecified: Secondary | ICD-10-CM

## 2013-11-19 DIAGNOSIS — D649 Anemia, unspecified: Secondary | ICD-10-CM

## 2013-11-19 MED ORDER — CEFPROZIL 250 MG/5ML PO SUSR: 500.0000 mg | Freq: Two times a day (BID) | ORAL | Status: DC

## 2013-11-19 NOTE — Progress Notes (Signed)
   Subjective:    Patient ID: Penny [name], female    DOB: 1986-09-29, 27 y.o.   MRN: [id number]  Fever  This is a new problem. The current episode started in the past 7 days. The problem occurs daily. The maximum temperature noted was 101 to 101.9 F. The temperature was taken using an oral thermometer. Associated symptoms include congestion, coughing and muscle aches. Pertinent negatives include no chest pain, ear pain or wheezing. She has tried acetaminophen and NSAIDs for the symptoms. The treatment provided mild relief.   PMH benign recent lab work was reviewed in detail with patient   Review of Systems  Constitutional: Positive for fever. Negative for activity change.  HENT: Positive for congestion and rhinorrhea. Negative for ear pain.   Eyes: Negative for discharge.  Respiratory: Positive for cough. Negative for shortness of breath and wheezing.   Cardiovascular: Negative for chest pain.       Objective:   Physical Exam  Nursing note and vitals reviewed. Constitutional: She appears well-developed.  HENT:  Head: Normocephalic.  Nose: Nose normal.  Mouth/Throat: Oropharynx is clear and moist. No oropharyngeal exudate.  Neck: Neck supple.  Cardiovascular: Normal rate and normal heart sounds.   No murmur heard. Pulmonary/Chest: Effort normal and breath sounds normal. She has no wheezes.  Lymphadenopathy:    She has no cervical adenopathy.  Skin: Skin is warm and dry.          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

## 2013-11-23 ENCOUNTER — Telehealth: Payer: Self-pay | Admitting: *Deleted

## 2013-11-23 NOTE — Telephone Encounter (Signed)
Pt states having severe abdominal, requesting pain medication. Advised pt Dr. Despina Hidden our of office for the day go to ER. Pt states will not go to ER, requesting message be routed to Dr. Despina Hidden.

## 2013-11-26 MED ORDER — HYDROCODONE-ACETAMINOPHEN 5-325 MG PO TABS: 1.0000 | ORAL_TABLET | Freq: Four times a day (QID) | ORAL | Status: DC | PRN

## 2013-11-26 NOTE — Telephone Encounter (Signed)
Left message letting pt know Hydrocodone Rx was at the front desk. JSY

## 2013-12-06 NOTE — L&D Delivery Note (Signed)
Delivery Note At 10:23 PM a viable female was delivered via Vaginal, Spontaneous Delivery (Presentation: Right Occiput Anterior).  APGAR: 5, 8; weight 5 lb 3.6 oz (2370 g). NICU was at delivery given gestational age.    Placenta status: Abnormal, Retained Intact, Manual removal at 30 minutes Pathology Cord: 3 vessels    Anesthesia: Epidural  Episiotomy: None Lacerations: 3nd degree perineal with bilateral deep sulcal requiring repair in the operating room (see separate procedure note) Suture Repair: 3.0 vicryl Est. Blood Loss (mL): 800  Mom to AICU.  Baby to NICU.  Shelbie Hutching 07/30/2014, 3:55 AM

## 2013-12-12 ENCOUNTER — Telehealth: Payer: Self-pay | Admitting: *Deleted

## 2013-12-12 MED ORDER — CYCLOBENZAPRINE HCL 10 MG PO TABS: 10.0000 mg | ORAL_TABLET | Freq: Three times a day (TID) | ORAL | Status: DC | PRN

## 2013-12-12 NOTE — Telephone Encounter (Signed)
Flexeril refilled per pt request

## 2013-12-13 ENCOUNTER — Encounter: Payer: Self-pay | Admitting: Obstetrics & Gynecology

## 2013-12-13 ENCOUNTER — Telehealth: Payer: Self-pay | Admitting: *Deleted

## 2013-12-13 ENCOUNTER — Ambulatory Visit (INDEPENDENT_AMBULATORY_CARE_PROVIDER_SITE_OTHER): Payer: 59 | Admitting: Obstetrics & Gynecology

## 2013-12-13 VITALS — BP 110/80 | Wt 198.0 lb

## 2013-12-13 DIAGNOSIS — N3010 Interstitial cystitis (chronic) without hematuria: Secondary | ICD-10-CM

## 2013-12-13 DIAGNOSIS — Z3202 Encounter for pregnancy test, result negative: Secondary | ICD-10-CM

## 2013-12-13 LAB — POCT URINE PREGNANCY: Preg Test, Ur: NEGATIVE

## 2013-12-13 MED ORDER — URIBEL 118 MG PO CAPS: 1.0000 | ORAL_CAPSULE | Freq: Three times a day (TID) | ORAL | Status: DC

## 2013-12-13 NOTE — Telephone Encounter (Signed)
Needs DMSO irirgation appointment and take her anti spasm meds

## 2013-12-13 NOTE — Progress Notes (Signed)
Patient ID: Penny [name], female   DOB: [birth year], 28 y.o.   MRN: [id number] Pt has not had an irrigation in 5 weeks, was supposed to follow up at 2 week intervals Had been much improved, of course now back to where she was before In a lot of pain today Samples for uribel given and Rx e prescribed  DMSO instillation done today 75 cc urine collected  Had not gone in 1 hour  Follow up in 2 weeks

## 2013-12-24 ENCOUNTER — Emergency Department (HOSPITAL_COMMUNITY)
Admission: EM | Admit: 2013-12-24 | Discharge: 2013-12-24 | Disposition: A | Discharge status: Home / Self Care | Payer: 59 | Attending: Emergency Medicine | Admitting: Emergency Medicine

## 2013-12-24 ENCOUNTER — Encounter (HOSPITAL_COMMUNITY): Payer: Self-pay | Admitting: Emergency Medicine

## 2013-12-24 DIAGNOSIS — F329 Major depressive disorder, single episode, unspecified: Secondary | ICD-10-CM | POA: Insufficient documentation

## 2013-12-24 DIAGNOSIS — O9989 Other specified diseases and conditions complicating pregnancy, childbirth and the puerperium: Secondary | ICD-10-CM | POA: Insufficient documentation

## 2013-12-24 DIAGNOSIS — F988 Other specified behavioral and emotional disorders with onset usually occurring in childhood and adolescence: Secondary | ICD-10-CM | POA: Insufficient documentation

## 2013-12-24 DIAGNOSIS — Z8742 Personal history of other diseases of the female genital tract: Secondary | ICD-10-CM | POA: Insufficient documentation

## 2013-12-24 DIAGNOSIS — J029 Acute pharyngitis, unspecified: Secondary | ICD-10-CM | POA: Insufficient documentation

## 2013-12-24 DIAGNOSIS — O99210 Obesity complicating pregnancy, unspecified trimester: Secondary | ICD-10-CM

## 2013-12-24 DIAGNOSIS — O99330 Smoking (tobacco) complicating pregnancy, unspecified trimester: Secondary | ICD-10-CM | POA: Insufficient documentation

## 2013-12-24 DIAGNOSIS — M545 Low back pain, unspecified: Secondary | ICD-10-CM | POA: Insufficient documentation

## 2013-12-24 DIAGNOSIS — Z91040 Latex allergy status: Secondary | ICD-10-CM | POA: Insufficient documentation

## 2013-12-24 DIAGNOSIS — G43909 Migraine, unspecified, not intractable, without status migrainosus: Secondary | ICD-10-CM | POA: Insufficient documentation

## 2013-12-24 DIAGNOSIS — E669 Obesity, unspecified: Secondary | ICD-10-CM | POA: Insufficient documentation

## 2013-12-24 DIAGNOSIS — IMO0002 Reserved for concepts with insufficient information to code with codable children: Secondary | ICD-10-CM | POA: Insufficient documentation

## 2013-12-24 DIAGNOSIS — R109 Unspecified abdominal pain: Secondary | ICD-10-CM | POA: Insufficient documentation

## 2013-12-24 DIAGNOSIS — Z8719 Personal history of other diseases of the digestive system: Secondary | ICD-10-CM | POA: Insufficient documentation

## 2013-12-24 DIAGNOSIS — G8929 Other chronic pain: Secondary | ICD-10-CM | POA: Insufficient documentation

## 2013-12-24 DIAGNOSIS — Z79899 Other long term (current) drug therapy: Secondary | ICD-10-CM | POA: Insufficient documentation

## 2013-12-24 DIAGNOSIS — F411 Generalized anxiety disorder: Secondary | ICD-10-CM | POA: Insufficient documentation

## 2013-12-24 DIAGNOSIS — Z862 Personal history of diseases of the blood and blood-forming organs and certain disorders involving the immune mechanism: Secondary | ICD-10-CM | POA: Insufficient documentation

## 2013-12-24 DIAGNOSIS — R42 Dizziness and giddiness: Secondary | ICD-10-CM | POA: Insufficient documentation

## 2013-12-24 DIAGNOSIS — O99340 Other mental disorders complicating pregnancy, unspecified trimester: Secondary | ICD-10-CM | POA: Insufficient documentation

## 2013-12-24 DIAGNOSIS — Z9089 Acquired absence of other organs: Secondary | ICD-10-CM | POA: Insufficient documentation

## 2013-12-24 DIAGNOSIS — F3289 Other specified depressive episodes: Secondary | ICD-10-CM | POA: Insufficient documentation

## 2013-12-24 DIAGNOSIS — Z3490 Encounter for supervision of normal pregnancy, unspecified, unspecified trimester: Secondary | ICD-10-CM

## 2013-12-24 LAB — COMPREHENSIVE METABOLIC PANEL
ALBUMIN: 4.6 g/dL (ref 3.5–5.2)
ALT: 53 U/L — AB (ref 0–35)
AST: 39 U/L — AB (ref 0–37)
Alkaline Phosphatase: 82 U/L (ref 39–117)
BUN: 12 mg/dL (ref 6–23)
CALCIUM: 9.5 mg/dL (ref 8.4–10.5)
CO2: 22 meq/L (ref 19–32)
Chloride: 100 mEq/L (ref 96–112)
Creatinine, Ser: 0.57 mg/dL (ref 0.50–1.10)
GFR calc Af Amer: >90 mL/min (ref >90)
Glucose, Bld: 77 mg/dL (ref 70–99)
Potassium: 3.8 mEq/L (ref 3.7–5.3)
SODIUM: 138 meq/L (ref 137–147)
Total Bilirubin: 0.5 mg/dL (ref 0.3–1.2)
Total Protein: 8.3 g/dL (ref 6.0–8.3)

## 2013-12-24 LAB — LIPASE, BLOOD: LIPASE: 26 U/L (ref 11–59)

## 2013-12-24 LAB — CBC WITH DIFFERENTIAL/PLATELET
BASOS ABS: 0 10*3/uL (ref 0.0–0.1)
BASOS PCT: 0 % (ref 0–1)
EOS PCT: 4 % (ref 0–5)
Eosinophils Absolute: 0.2 10*3/uL (ref 0.0–0.7)
HCT: 28.2 % — ABNORMAL LOW (ref 36.0–46.0)
Hemoglobin: 8.6 g/dL — ABNORMAL LOW (ref 12.0–15.0)
Lymphocytes Relative: 42 % (ref 12–46)
Lymphs Abs: 2.1 10*3/uL (ref 0.7–4.0)
MCH: 21.8 pg — ABNORMAL LOW (ref 26.0–34.0)
MCHC: 30.5 g/dL (ref 30.0–36.0)
MCV: 71.4 fL — AB (ref 78.0–100.0)
Monocytes Absolute: 0.4 10*3/uL (ref 0.1–1.0)
Monocytes Relative: 8 % (ref 3–12)
Neutro Abs: 2.3 10*3/uL (ref 1.7–7.7)
Neutrophils Relative %: 45 % (ref 43–77)
PLATELETS: 327 10*3/uL (ref 150–400)
RBC: 3.95 MIL/uL (ref 3.87–5.11)
RDW: 17.3 % — AB (ref 11.5–15.5)
WBC: 5 10*3/uL (ref 4.0–10.5)

## 2013-12-24 LAB — URINALYSIS, ROUTINE W REFLEX MICROSCOPIC
Bilirubin Urine: NEGATIVE
GLUCOSE, UA: NEGATIVE mg/dL
Hgb urine dipstick: NEGATIVE
Ketones, ur: NEGATIVE mg/dL
LEUKOCYTES UA: NEGATIVE
Nitrite: NEGATIVE
PH: 6.5 (ref 5.0–8.0)
Protein, ur: NEGATIVE mg/dL
Specific Gravity, Urine: 1.015 (ref 1.005–1.030)
Urobilinogen, UA: 0.2 mg/dL (ref 0.0–1.0)

## 2013-12-24 LAB — RETICULOCYTES
RBC.: 3.95 MIL/uL (ref 3.87–5.11)
RETIC COUNT ABSOLUTE: 51.4 10*3/uL (ref 19.0–186.0)
Retic Ct Pct: 1.3 % (ref 0.4–3.1)

## 2013-12-24 LAB — IRON AND TIBC
IRON: 14 ug/dL — AB (ref 42–135)
Saturation Ratios: 3 % — ABNORMAL LOW (ref 20–55)
TIBC: 467 ug/dL (ref 250–470)
UIBC: 453 ug/dL — ABNORMAL HIGH (ref 125–400)

## 2013-12-24 LAB — PREGNANCY, URINE: PREG TEST UR: POSITIVE — AB

## 2013-12-24 LAB — TROPONIN I: Troponin I: <0.3 ng/mL (ref <0.30)

## 2013-12-24 MED ORDER — KETOROLAC TROMETHAMINE 30 MG/ML IJ SOLN
30.0000 mg | Freq: Once | INTRAMUSCULAR | Status: AC
  Administered 2013-12-24: 30 mg via INTRAVENOUS
  Filled 2013-12-24: qty 1

## 2013-12-24 MED ORDER — PANTOPRAZOLE SODIUM 40 MG IV SOLR
40.0000 mg | Freq: Once | INTRAVENOUS | Status: AC
  Administered 2013-12-24: 40 mg via INTRAVENOUS
  Filled 2013-12-24: qty 40

## 2013-12-24 MED ORDER — SODIUM CHLORIDE 0.9 % IV BOLUS (SEPSIS)
1000.0000 mL | Freq: Once | INTRAVENOUS | Status: AC
  Administered 2013-12-24: 1000 mL via INTRAVENOUS

## 2013-12-24 MED ORDER — ONDANSETRON HCL 4 MG/2ML IJ SOLN
4.0000 mg | Freq: Once | INTRAMUSCULAR | Status: DC
  Filled 2013-12-24: qty 2

## 2013-12-24 NOTE — ED Notes (Signed)
Sore throat with sores in mouth and chills starting this morning.  Also c/o nausea.

## 2013-12-24 NOTE — ED Provider Notes (Signed)
MSE was initiated and I personally evaluated the patient and placed orders (if any) at  3:43 PM on December 24, 2013.  Penny [name] is a 28 y/o female with past medical history of migraines, gastritis, depression, IBS, ADD, potassium deficiency, iron deficiency, anemia presenting to emergency department with dizziness, headache localized to the right side, nausea, diarrhea, abdominal pain, discomfort. Patient reports that the symptoms have gotten progressively worse over the past 2 weeks. Patient reports she's noticed black spots in her vision that progressively gotten worse over the past week. Patient reports she's been feeling nauseous, denied vomiting. Reports loose stools with negative episodes of melena, hematochezia. Patient reports she's been experiencing abdominal pain described as a sharp shooting pain localized underneath bilateral breasts that is intermittent. Patient reports that she's been experiencing nasal congestion with a dry cough. Patient reports she's been experiencing a headache localize the right side described as a dull aching sensation that has been consistent for the past week. Patient reports that the headache worsens with fluorescent light and loud noises. Patient reports she's been having intermittent dizzy spells, reports when performing quick motions she's been having dizziness in room spinning sensation. Denied fevers, vomiting, melena, hematochezia, neck pain, neck stiffness, numbness, tingling, hemoptysis, inability to keep down food or fluids. Alert and oriented. Patient is pale and appears lethargic. GCS 15. Heart rate and rhythm normal. Radial and DP pulses 2+ bilaterally. Lungs clear to auscultation to upper and lower lobes bilaterally. Cap refill < 3 seconds. Full ROM to upper and lower extremities bilaterally without difficulty noted, negative ataxia noted. Strength intact with resistance applied, equal distribution noted.  Patient does not meet fast track criteria. Labs  have been ordered and placed. Patient has been moved to main ED for further work-up to be performed.  This provider discussed case with Dr. Sharmon Leyden. The patient appears stable so that the remainder of the MSE may be completed by another provider.  Jamse Mead, PA-C 12/24/13 1750

## 2013-12-24 NOTE — Discharge Instructions (Signed)
Follow up with an ob-gyn

## 2013-12-24 NOTE — ED Provider Notes (Signed)
CSN: [id number]     Arrival date & time 12/24/13  1148 History   First MD Initiated Contact with Patient 12/24/13 1436 This chart was scribed for Maudry Diego, MD by Anastasia Pall, ED Scribe. This patient was seen in room APA05/APA05 and the patient's care was started at 4:54 PM.     Chief Complaint  Patient presents with  . Sore Throat    Patient is a 28 y.o. female presenting with pharyngitis, abdominal pain, and dizziness. The history is provided by the patient. No language interpreter was used.  Sore Throat This is a new problem. Episode onset: earlier this morning. Associated symptoms include abdominal pain and headaches. Pertinent negatives include no chest pain.  Abdominal Pain Pain quality: sharp and shooting   Timing:  Intermittent Progression:  Unchanged Chronicity:  New Context comment:  H/o anemia, intersitial cystitis Associated symptoms: chills, nausea and sore throat   Associated symptoms: no chest pain, no cough, no diarrhea, no fatigue and no hematuria   Dizziness Quality:  Lightheadedness Context: standing up   Associated symptoms: headaches and nausea   Associated symptoms: no chest pain and no diarrhea    HPI Comments: Penny [name] is a 28 y.o. female who presents to the Emergency Department complaining of sore throat, sores in her mouth, with associated chills and nausea, onset this morning. She reports intermittent, sharp, shooting abdominal pain, that lasts a few seconds, onset a few days ago. She reports a h/o interstitial cystitis, stating her abdomen usually hurts a little bit, but that this recent shooting pain is new. She also reports mild, lower back pain. She reports constant, right sided headache. She reports h/o right sided headaches. She states her current headache is not quite a migraine. She reports current mild blurred vision. She reports that when she stands up from bending over she experiences dizziness, states she sees stars, onset a few weeks  ago.   She reports being diagnosed with anemia 5 years ago during pregnancy with her son, and having infusions. She reports having her WBC checked about one month ago. She reports levels in high 6's low 7's. She reports similar levels when she was pregnant, but denies having similar levels since then. She reports being seen at [facility] a few months ago with SOB symptoms. She states having blood work done, and being given 2 bags of IV potassium, with relief. She denies Dr. Wolfgang Phoenix having rechecked her potassium levels since then.  She denies any other symptoms.    PCP [name], MD  Past Medical History  Diagnosis Date  . Migraines   . Interstitial cystitis   . IUD FEB 2010  . Gastritis JULY 2011  . Depression   . 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 Fatty liver   . Obesity (BMI 30-39.9) 2011 228 LBS BMI 36.8  . Iron deficiency anemia 07/23/2010  . Irritable bowel syndrome 2012 DIARRHEA    JUN 2012 TTG IgA 14.9  . Elevated liver enzymes JUL 2011ALK PHOS 111-127 AST  143-267 ALT  213-321T BILI 0.6  ALB  3.7-4.06 Jun 2011 ALK PHOS 118 AST 24 ALT 42 T BILI 0.4 ALB 3.9  . Chronic daily headache   . Ovarian cyst   . PONV (postoperative nausea and vomiting)   . ADD (attention deficit disorder)   . RLQ abdominal pain 07/18/2013  . Menorrhagia 07/18/2013  . Anxiety   . Potassium (K) deficiency   . Dysmenorrhea 09/18/2013  .  Stress 09/18/2013  . Patient desires pregnancy 09/18/2013   Past Surgical History  Procedure Laterality Date  . Gab  2007    in [place]-POUCH 5 CM  . Cholecystectomy  2005    biliary dyskinesia  . Cath self every nite      for sodium bicarb injection (discontinued 2013)  . Colonoscopy  JUN 2012 ABD PN/DIARRHEA WITH PROPOFOL    NL COLON  . Upper gastrointestinal endoscopy  JULY 2011 NAUSEA-D125,V6, PH 25    Bx; GASTRITIS, POUCH-5 CM LONG  . Dilation and curettage of uterus    . Tonsillectomy    . Gastric bypass  06/2006  . Savory dilation  06/20/2012     IOE:VOJJ gastritis/Ulcer in the mid jejunum  . Tonsillectomy and adenoidectomy    . Esophagogastroduodenoscopy     Family History  Problem Relation Age of Onset  . Hemochromatosis Maternal Grandmother   . Migraines Maternal Grandmother   . Cancer Maternal Grandmother   . Hypertension Father   . Diabetes Father   . Coronary artery disease Father   . Migraines Paternal Grandmother   . Cancer Mother     breast  . Hemochromatosis Mother   . Coronary artery disease Paternal Grandfather    History  Substance Use Topics  . Smoking status: Current Some Day Smoker -- 1.00 packs/day for 5 years    Types: Cigarettes  . Smokeless tobacco: Never Used  . Alcohol Use: No   OB History   Grav Para Term Preterm Abortions TAB SAB Ect Mult Living   '2 1 1  1  1   1     '$ Review of Systems  Constitutional: Positive for chills. Negative for appetite change and fatigue.  HENT: Positive for sore throat. Negative for congestion, ear discharge and sinus pressure.   Eyes: Positive for visual disturbance (blurry vision). Negative for discharge.  Respiratory: Negative for cough.   Cardiovascular: Negative for chest pain.  Gastrointestinal: Positive for nausea and abdominal pain. Negative for diarrhea.  Genitourinary: Negative for frequency and hematuria.  Musculoskeletal: Positive for back pain (lower).  Skin: Negative for rash.  Neurological: Positive for dizziness and headaches. Negative for seizures.  Psychiatric/Behavioral: Negative for hallucinations.    Allergies  Gabapentin; Metoclopramide hcl; Topamax; Codeine; Latex; and Tape  Home Medications   Current Outpatient Rx  Name  Route  Sig  Dispense  Refill  . acetaminophen (TYLENOL) 500 MG tablet   Oral   Take 1,000 mg by mouth every 6 (six) hours as needed for pain.         . Ascorbic Acid (VITAMIN C) 100 MG CHEW   Oral   Chew 2 tablets by mouth daily.         . clonazePAM (KLONOPIN) 0.5 MG tablet      TAKE (1) TABLET BY MOUTH  (3) TIMES DAILY AS NEEDED FOR ANXIETY.   90 tablet   3   . Cyanocobalamin (VITAMIN B-12) 500 MCG SUBL   Sublingual   Place 2 tablets under the tongue daily.         . cyclobenzaprine (FLEXERIL) 10 MG tablet   Oral   Take 1 tablet (10 mg total) by mouth 3 (three) times daily as needed for muscle spasms.   90 tablet   1   . ibuprofen (ADVIL,MOTRIN) 200 MG tablet   Oral   Take 600 mg by mouth every 6 (six) hours as needed for pain.         . methylphenidate (  CONCERTA) 54 MG CR tablet   Oral   Take 1 tablet (54 mg total) by mouth daily.   30 tablet   0   . nortriptyline (PAMELOR) 25 MG capsule   Oral   Take 25 mg by mouth at bedtime.         . Prenatal Vit-Fe Fumarate-FA (PRENATAL MULTIVITAMIN) TABS   Oral   Take 1 tablet by mouth at bedtime.          . promethazine (PHENERGAN) 25 MG tablet   Oral   Take 1 tablet (25 mg total) by mouth every 6 (six) hours as needed for nausea.   22 tablet   3   . Pseudoephedrine HCl (SUPHEDRIN PO)   Oral   Take 2 tablets by mouth every 4 (four) hours as needed (cold).         Marland Kitchen tiZANidine (ZANAFLEX) 4 MG tablet      TAKE 1 TABLET BY MOUTH EVERY 8 HOURS AS NEEDED FOR PAIN.   30 tablet   2   . triamcinolone ointment (KENALOG) 0.5 %   Topical   Apply 1 application topically 2 (two) times daily.   30 g   0   . zolpidem (AMBIEN) 5 MG tablet   Oral   Take 2.5 mg by mouth at bedtime as needed for sleep.          BP 121/78  Pulse 108  Temp(Src) 98 F (36.7 C) (Oral)  Resp 18  Ht $R'5\' 6"'Kd$  (1.676 m)  Wt 197 lb 8 oz (89.585 kg)  BMI 31.89 kg/m2  SpO2 98%  LMP 11/19/2013  Physical Exam  Constitutional: She is oriented to person, place, and time. She appears well-developed.  HENT:  Head: Normocephalic.  Eyes: Conjunctivae and EOM are normal. No scleral icterus.  Neck: Neck supple. No thyromegaly present.  Cardiovascular: Normal rate and regular rhythm.  Exam reveals no gallop and no friction rub.   No murmur  heard. Pulmonary/Chest: No stridor. She has no wheezes. She has no rales. She exhibits no tenderness.  Abdominal: Soft. She exhibits no distension. There is tenderness (diffuse throughout abdomen). There is no rebound.  Musculoskeletal: Normal range of motion. She exhibits no edema.  Lymphadenopathy:    She has no cervical adenopathy.  Neurological: She is oriented to person, place, and time. She exhibits normal muscle tone. Coordination normal.  Skin: No rash noted. No erythema.  Psychiatric: She has a normal mood and affect. Her behavior is normal.    ED Course  Procedures (including critical care time)  DIAGNOSTIC STUDIES: Oxygen Saturation is 98% on room air, normal by my interpretation.    COORDINATION OF CARE: 5:02 PM-Discussed treatment plan which includes Toradol, Zofran, Protonix, IV fluids, GI cocktail, UA, CBC, CMP, pregnancy test, and Troponin with pt at bedside and pt agreed to plan.   Results for orders placed during the hospital encounter of 12/24/13  CBC WITH DIFFERENTIAL      Result Value Range   WBC 5.0  4.0 - 10.5 K/uL   RBC 3.95  3.87 - 5.11 MIL/uL   Hemoglobin 8.6 (*) 12.0 - 15.0 g/dL   HCT 28.2 (*) 36.0 - 46.0 %   MCV 71.4 (*) 78.0 - 100.0 fL   MCH 21.8 (*) 26.0 - 34.0 pg   MCHC 30.5  30.0 - 36.0 g/dL   RDW 17.3 (*) 11.5 - 15.5 %   Platelets 327  150 - 400 K/uL   Neutrophils Relative % 45  43 -  77 %   Neutro Abs 2.3  1.7 - 7.7 K/uL   Lymphocytes Relative 42  12 - 46 %   Lymphs Abs 2.1  0.7 - 4.0 K/uL   Monocytes Relative 8  3 - 12 %   Monocytes Absolute 0.4  0.1 - 1.0 K/uL   Eosinophils Relative 4  0 - 5 %   Eosinophils Absolute 0.2  0.0 - 0.7 K/uL   Basophils Relative 0  0 - 1 %   Basophils Absolute 0.0  0.0 - 0.1 K/uL  COMPREHENSIVE METABOLIC PANEL      Result Value Range   Sodium 138  137 - 147 mEq/L   Potassium 3.8  3.7 - 5.3 mEq/L   Chloride 100  96 - 112 mEq/L   CO2 22  19 - 32 mEq/L   Glucose, Bld 77  70 - 99 mg/dL   BUN 12  6 - 23 mg/dL    Creatinine, Ser 0.57  0.50 - 1.10 mg/dL   Calcium 9.5  8.4 - 10.5 mg/dL   Total Protein 8.3  6.0 - 8.3 g/dL   Albumin 4.6  3.5 - 5.2 g/dL   AST 39 (*) 0 - 37 U/L   ALT 53 (*) 0 - 35 U/L   Alkaline Phosphatase 82  39 - 117 U/L   Total Bilirubin 0.5  0.3 - 1.2 mg/dL   GFR calc non Af Amer >90  >90 mL/min   GFR calc Af Amer >90  >90 mL/min  TROPONIN I      Result Value Range   Troponin I <0.30  <0.30 ng/mL  URINALYSIS, ROUTINE W REFLEX MICROSCOPIC      Result Value Range   Color, Urine YELLOW  YELLOW   APPearance CLEAR  CLEAR   Specific Gravity, Urine 1.015  1.005 - 1.030   pH 6.5  5.0 - 8.0   Glucose, UA NEGATIVE  NEGATIVE mg/dL   Hgb urine dipstick NEGATIVE  NEGATIVE   Bilirubin Urine NEGATIVE  NEGATIVE   Ketones, ur NEGATIVE  NEGATIVE mg/dL   Protein, ur NEGATIVE  NEGATIVE mg/dL   Urobilinogen, UA 0.2  0.0 - 1.0 mg/dL   Nitrite NEGATIVE  NEGATIVE   Leukocytes, UA NEGATIVE  NEGATIVE  PREGNANCY, URINE      Result Value Range   Preg Test, Ur POSITIVE (*) NEGATIVE  LIPASE, BLOOD      Result Value Range   Lipase 26  11 - 59 U/L  RETICULOCYTES      Result Value Range   Retic Ct Pct 1.3  0.4 - 3.1 %   RBC. 3.95  3.87 - 5.11 MIL/uL   Retic Count, Manual 51.4  19.0 - 186.0 K/uL   No results found.   Medications  ondansetron (ZOFRAN) injection 4 mg (4 mg Intravenous Not Given 12/24/13 1745)  sodium chloride 0.9 % bolus 1,000 mL (1,000 mLs Intravenous New Bag/Given 12/24/13 1629)  ketorolac (TORADOL) 30 MG/ML injection 30 mg (30 mg Intravenous Given 12/24/13 1729)  pantoprazole (PROTONIX) injection 40 mg (40 mg Intravenous Given 12/24/13 1729)    MDM  Pregnancy , anemia The chart was scribed for me under my direct supervision.  I personally performed the history, physical, and medical decision making and all procedures in the evaluation of this patient.Maudry Diego, MD 12/24/13 [phone]

## 2013-12-24 NOTE — ED Notes (Signed)
Sore throat, nausea, no vomiting, Had diarrhea yesterday. Nasal congestion.  Dizziness at time. Has been taking tylenol,motrin and sudafed.  "Feels cold"    Looks pale. Says she has been "seeing spots".

## 2013-12-25 ENCOUNTER — Encounter: Payer: Self-pay | Admitting: Adult Health

## 2013-12-25 ENCOUNTER — Encounter (INDEPENDENT_AMBULATORY_CARE_PROVIDER_SITE_OTHER): Payer: Self-pay

## 2013-12-25 ENCOUNTER — Ambulatory Visit (INDEPENDENT_AMBULATORY_CARE_PROVIDER_SITE_OTHER): Payer: 59 | Admitting: Adult Health

## 2013-12-25 VITALS — BP 120/72 | Ht 66.0 in | Wt 199.0 lb

## 2013-12-25 DIAGNOSIS — Z3490 Encounter for supervision of normal pregnancy, unspecified, unspecified trimester: Secondary | ICD-10-CM

## 2013-12-25 DIAGNOSIS — Z3201 Encounter for pregnancy test, result positive: Secondary | ICD-10-CM

## 2013-12-25 DIAGNOSIS — O99019 Anemia complicating pregnancy, unspecified trimester: Secondary | ICD-10-CM

## 2013-12-25 DIAGNOSIS — Z3200 Encounter for pregnancy test, result unknown: Secondary | ICD-10-CM

## 2013-12-25 HISTORY — DX: Encounter for supervision of normal pregnancy, unspecified, unspecified trimester: Z34.90

## 2013-12-25 LAB — FOLATE

## 2013-12-25 LAB — VITAMIN B12: Vitamin B-12: 1797 pg/mL — ABNORMAL HIGH (ref 211–911)

## 2013-12-25 LAB — FERRITIN: Ferritin: 3 ng/mL — ABNORMAL LOW (ref 10–291)

## 2013-12-25 LAB — POCT URINE PREGNANCY: PREG TEST UR: POSITIVE

## 2013-12-25 NOTE — Progress Notes (Signed)
Subjective:     Patient ID: Penny [name], female   DOB: [birth year], 28 y.o.   MRN: [id number]  HPI Penny [name] is in for pregnancy test, she was + yesterday, when seen for sore throat.LMP 11/19/13.Will get [facility] and progesterone level and schedule [name] in 3 days.  Review of Systems See HPI Reviewed past medical,surgical, social and family history. Reviewed medications and allergies.     Objective:   Physical Exam BP 120/72  Ht 5\' 6"  (1.676 m)  Wt 199 lb (90.266 kg)  BMI 32.13 kg/m2  LMP 12/15/2014UPT+ See HPI, had negative test 2 weeks ago when got DMSO, feeling nervous about pregnancy but wants to be pregnant    Assessment:    Pregnant     Plan:     Check QHCG and progesterone level Return in 3 days for [name] and see me

## 2013-12-25 NOTE — Patient Instructions (Signed)

## 2013-12-25 NOTE — ED Provider Notes (Signed)
Medical screening examination/treatment/procedure(s) were performed by non-physician practitioner and as supervising physician I was immediately available for consultation/collaboration.  EKG Interpretation   None         Delice Bison Ward, DO 12/25/13 1937

## 2013-12-26 ENCOUNTER — Telehealth: Payer: Self-pay | Admitting: Obstetrics and Gynecology

## 2013-12-26 ENCOUNTER — Other Ambulatory Visit: Payer: Self-pay | Admitting: Family Medicine

## 2013-12-26 DIAGNOSIS — D509 Iron deficiency anemia, unspecified: Secondary | ICD-10-CM

## 2013-12-26 LAB — HCG, QUANTITATIVE, PREGNANCY: hCG, Beta Chain, Quant, S: 243.3 m[IU]/mL

## 2013-12-26 LAB — PROGESTERONE: PROGESTERONE: 13.7 ng/mL

## 2013-12-26 NOTE — Telephone Encounter (Signed)
Pt aware of labs, will come in 1/22 at 10 am for [facility] and then decide about [name] Friday, may move to next week, also pt aware of need for iron infusion but Dr Glo Herring suggests after 12 weeks, take prenatals

## 2013-12-27 ENCOUNTER — Telehealth: Payer: Self-pay | Admitting: Adult Health

## 2013-12-27 ENCOUNTER — Encounter (INDEPENDENT_AMBULATORY_CARE_PROVIDER_SITE_OTHER): Payer: Self-pay

## 2013-12-27 ENCOUNTER — Other Ambulatory Visit: Payer: 59

## 2013-12-27 DIAGNOSIS — O200 Threatened abortion: Secondary | ICD-10-CM

## 2013-12-27 LAB — HCG, QUANTITATIVE, PREGNANCY: HCG, BETA CHAIN, QUANT, S: 710.1 m[IU]/mL

## 2013-12-27 NOTE — Telephone Encounter (Signed)
Pt aware QHCG as doubled, will cx [name] in am and schedule for 1/29, get [facility] 1/26

## 2013-12-27 NOTE — Addendum Note (Signed)
Addended by: Farley Ly on: 12/27/2013 10:28 AM   Modules accepted: Orders

## 2013-12-28 ENCOUNTER — Ambulatory Visit: Payer: 59 | Admitting: Obstetrics & Gynecology

## 2013-12-28 ENCOUNTER — Ambulatory Visit: Payer: 59 | Admitting: Adult Health

## 2013-12-28 ENCOUNTER — Other Ambulatory Visit: Payer: 59

## 2013-12-31 ENCOUNTER — Other Ambulatory Visit: Payer: 59

## 2013-12-31 DIAGNOSIS — O200 Threatened abortion: Secondary | ICD-10-CM

## 2014-01-01 ENCOUNTER — Telehealth: Payer: Self-pay | Admitting: Adult Health

## 2014-01-01 LAB — HCG, QUANTITATIVE, PREGNANCY: hCG, Beta Chain, Quant, S: 5066.2 m[IU]/mL

## 2014-01-01 MED ORDER — HYDROXYZINE PAMOATE 25 MG PO CAPS: ORAL_CAPSULE | ORAL | Status: DC

## 2014-01-01 MED ORDER — CLONAZEPAM 0.5 MG PO TABS: ORAL_TABLET | ORAL | Status: DC

## 2014-01-01 NOTE — Telephone Encounter (Signed)
Discussed pt with Dr Elonda Husky and would like to try to wean off klonipin, will try vistaril bid for panic attacks, keep appt for [name]

## 2014-01-01 NOTE — Telephone Encounter (Signed)
Pt aware of labs keep US appt 

## 2014-01-03 ENCOUNTER — Encounter (INDEPENDENT_AMBULATORY_CARE_PROVIDER_SITE_OTHER): Payer: Self-pay

## 2014-01-03 ENCOUNTER — Other Ambulatory Visit: Payer: Self-pay | Admitting: Adult Health

## 2014-01-03 ENCOUNTER — Ambulatory Visit (INDEPENDENT_AMBULATORY_CARE_PROVIDER_SITE_OTHER): Payer: Medicaid Other | Admitting: Adult Health

## 2014-01-03 ENCOUNTER — Encounter: Payer: Self-pay | Admitting: Adult Health

## 2014-01-03 ENCOUNTER — Ambulatory Visit (INDEPENDENT_AMBULATORY_CARE_PROVIDER_SITE_OTHER): Payer: 59

## 2014-01-03 VITALS — BP 116/72 | Ht 66.0 in | Wt 194.0 lb

## 2014-01-03 DIAGNOSIS — O09299 Supervision of pregnancy with other poor reproductive or obstetric history, unspecified trimester: Secondary | ICD-10-CM

## 2014-01-03 DIAGNOSIS — O26849 Uterine size-date discrepancy, unspecified trimester: Secondary | ICD-10-CM

## 2014-01-03 DIAGNOSIS — O99840 Bariatric surgery status complicating pregnancy, unspecified trimester: Secondary | ICD-10-CM

## 2014-01-03 DIAGNOSIS — Z3490 Encounter for supervision of normal pregnancy, unspecified, unspecified trimester: Secondary | ICD-10-CM

## 2014-01-03 DIAGNOSIS — O99340 Other mental disorders complicating pregnancy, unspecified trimester: Secondary | ICD-10-CM

## 2014-01-03 DIAGNOSIS — O99019 Anemia complicating pregnancy, unspecified trimester: Secondary | ICD-10-CM

## 2014-01-03 NOTE — Patient Instructions (Signed)

## 2014-01-03 NOTE — Progress Notes (Signed)
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 Noted on LT, Retroverted uterus noted

## 2014-01-03 NOTE — Progress Notes (Signed)
Subjective:     Patient ID: Penny [name], female   DOB: [birth year], 28 y.o.   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  Review of Systems See HPI Reviewed past medical,surgical, social and family history. Reviewed medications and allergies.     Objective:   Physical Exam BP 116/72  Ht 5\' 6"  (1.676 m)  Wt 194 lb (87.998 kg)  BMI 31.33 kg/m2  LMP 11/19/2013   US showed IUP with GS 8.9 mm and CRL 2.2 mm +FCA could not pick up with doppler but was seen, Husband with her and excited.  Assessment:     Pregnant    Plan:     Follow up in 8 days for growth [name]   Review handout on first trimester

## 2014-01-03 NOTE — Addendum Note (Signed)
Addended by: Derrek Monaco A on: 01/03/2014 02:48 PM   Modules accepted: Level of Service

## 2014-01-11 ENCOUNTER — Ambulatory Visit (INDEPENDENT_AMBULATORY_CARE_PROVIDER_SITE_OTHER): Payer: Medicaid Other | Admitting: Adult Health

## 2014-01-11 ENCOUNTER — Encounter (INDEPENDENT_AMBULATORY_CARE_PROVIDER_SITE_OTHER): Payer: Self-pay

## 2014-01-11 ENCOUNTER — Other Ambulatory Visit: Payer: Self-pay | Admitting: Adult Health

## 2014-01-11 ENCOUNTER — Encounter: Payer: Self-pay | Admitting: Adult Health

## 2014-01-11 ENCOUNTER — Ambulatory Visit (INDEPENDENT_AMBULATORY_CARE_PROVIDER_SITE_OTHER): Payer: 59

## 2014-01-11 VITALS — BP 108/70 | Ht 66.0 in | Wt 194.0 lb

## 2014-01-11 DIAGNOSIS — O26849 Uterine size-date discrepancy, unspecified trimester: Secondary | ICD-10-CM

## 2014-01-11 DIAGNOSIS — O09299 Supervision of pregnancy with other poor reproductive or obstetric history, unspecified trimester: Secondary | ICD-10-CM

## 2014-01-11 DIAGNOSIS — O3680X0 Pregnancy with inconclusive fetal viability, not applicable or unspecified: Secondary | ICD-10-CM

## 2014-01-11 DIAGNOSIS — O99019 Anemia complicating pregnancy, unspecified trimester: Secondary | ICD-10-CM

## 2014-01-11 DIAGNOSIS — O99340 Other mental disorders complicating pregnancy, unspecified trimester: Secondary | ICD-10-CM

## 2014-01-11 DIAGNOSIS — Z3490 Encounter for supervision of normal pregnancy, unspecified, unspecified trimester: Secondary | ICD-10-CM

## 2014-01-11 DIAGNOSIS — O99840 Bariatric surgery status complicating pregnancy, unspecified trimester: Secondary | ICD-10-CM

## 2014-01-11 NOTE — Patient Instructions (Signed)
Return in 2 weeks for New ob and [name]

## 2014-01-11 NOTE — Addendum Note (Signed)
Addended by: Derrek Monaco A on: 01/11/2014 10:48 AM   Modules accepted: Level of Service

## 2014-01-11 NOTE — Progress Notes (Signed)
Subjective:     Patient ID: Penny [name], female   DOB: 1986-06-14, 28 y.o.   MRN: [id number]  HPI Penny [name] is back for growth [name].Doing well.  Review of Systems See HPI Reviewed past medical,surgical, social and family history. Reviewed medications and allergies.     Objective:   Physical Exam BP 108/70  Ht 5\' 6"  (1.676 m)  Wt 194 lb (87.998 kg)  BMI 31.33 kg/m2  LMP 11/19/2013   Reviewed [name] with pt and husband, has CRL of 7.2 mm and +FHM 131  Assessment:     Pregnant     Plan:     Follow up in 2 weeks for [name] and new OB visit Call prn problems

## 2014-01-11 NOTE — Progress Notes (Signed)
U/S(6+4wks)-retroverted uterus with single IUP, +FCA noted FHR- 131 bpm, cx appears long and closed, bilateral adnexa appears wnl with C.L. Noted on Left, no free fluid noted within pelvis, CRL with previous u/s dates

## 2014-01-16 ENCOUNTER — Encounter (HOSPITAL_COMMUNITY): Payer: Self-pay | Admitting: *Deleted

## 2014-01-16 ENCOUNTER — Inpatient Hospital Stay (HOSPITAL_COMMUNITY)
Admission: AD | Admit: 2014-01-16 | Discharge: 2014-01-16 | Disposition: A | Discharge status: Home / Self Care | Payer: 59 | Source: Ambulatory Visit | Attending: Obstetrics & Gynecology | Admitting: Obstetrics & Gynecology

## 2014-01-16 DIAGNOSIS — L240 Irritant contact dermatitis due to detergents: Secondary | ICD-10-CM

## 2014-01-16 DIAGNOSIS — O9989 Other specified diseases and conditions complicating pregnancy, childbirth and the puerperium: Secondary | ICD-10-CM

## 2014-01-16 DIAGNOSIS — N3010 Interstitial cystitis (chronic) without hematuria: Secondary | ICD-10-CM | POA: Insufficient documentation

## 2014-01-16 DIAGNOSIS — O2390 Unspecified genitourinary tract infection in pregnancy, unspecified trimester: Secondary | ICD-10-CM | POA: Insufficient documentation

## 2014-01-16 DIAGNOSIS — O99891 Other specified diseases and conditions complicating pregnancy: Secondary | ICD-10-CM | POA: Insufficient documentation

## 2014-01-16 DIAGNOSIS — G2581 Restless legs syndrome: Secondary | ICD-10-CM | POA: Insufficient documentation

## 2014-01-16 DIAGNOSIS — R109 Unspecified abdominal pain: Secondary | ICD-10-CM | POA: Insufficient documentation

## 2014-01-16 DIAGNOSIS — R42 Dizziness and giddiness: Secondary | ICD-10-CM | POA: Insufficient documentation

## 2014-01-16 DIAGNOSIS — R350 Frequency of micturition: Secondary | ICD-10-CM | POA: Insufficient documentation

## 2014-01-16 DIAGNOSIS — L259 Unspecified contact dermatitis, unspecified cause: Secondary | ICD-10-CM | POA: Insufficient documentation

## 2014-01-16 DIAGNOSIS — O99019 Anemia complicating pregnancy, unspecified trimester: Secondary | ICD-10-CM | POA: Diagnosis present

## 2014-01-16 DIAGNOSIS — D509 Iron deficiency anemia, unspecified: Secondary | ICD-10-CM | POA: Insufficient documentation

## 2014-01-16 LAB — WET PREP, GENITAL
Clue Cells Wet Prep HPF POC: NONE SEEN
TRICH WET PREP: NONE SEEN
Yeast Wet Prep HPF POC: NONE SEEN

## 2014-01-16 LAB — URINE MICROSCOPIC-ADD ON

## 2014-01-16 LAB — CBC
HEMATOCRIT: 25.3 % — AB (ref 36.0–46.0)
HEMOGLOBIN: 7.9 g/dL — AB (ref 12.0–15.0)
MCH: 22.3 pg — ABNORMAL LOW (ref 26.0–34.0)
MCHC: 31.2 g/dL (ref 30.0–36.0)
MCV: 71.3 fL — ABNORMAL LOW (ref 78.0–100.0)
Platelets: 247 10*3/uL (ref 150–400)
RBC: 3.55 MIL/uL — AB (ref 3.87–5.11)
RDW: 19.1 % — ABNORMAL HIGH (ref 11.5–15.5)
WBC: 5.7 10*3/uL (ref 4.0–10.5)

## 2014-01-16 LAB — URINALYSIS, ROUTINE W REFLEX MICROSCOPIC
Bilirubin Urine: NEGATIVE
GLUCOSE, UA: NEGATIVE mg/dL
HGB URINE DIPSTICK: NEGATIVE
Ketones, ur: NEGATIVE mg/dL
Nitrite: NEGATIVE
PROTEIN: NEGATIVE mg/dL
Specific Gravity, Urine: 1.02 (ref 1.005–1.030)
Urobilinogen, UA: 0.2 mg/dL (ref 0.0–1.0)
pH: 5.5 (ref 5.0–8.0)

## 2014-01-16 LAB — COMPREHENSIVE METABOLIC PANEL
ALT: 29 U/L (ref 0–35)
AST: 30 U/L (ref 0–37)
Albumin: 4.1 g/dL (ref 3.5–5.2)
Alkaline Phosphatase: 52 U/L (ref 39–117)
BILIRUBIN TOTAL: 0.4 mg/dL (ref 0.3–1.2)
BUN: 9 mg/dL (ref 6–23)
CHLORIDE: 101 meq/L (ref 96–112)
CO2: 23 meq/L (ref 19–32)
Calcium: 9.5 mg/dL (ref 8.4–10.5)
Creatinine, Ser: 0.46 mg/dL — ABNORMAL LOW (ref 0.50–1.10)
GLUCOSE: 123 mg/dL — AB (ref 70–99)
POTASSIUM: 3.6 meq/L — AB (ref 3.7–5.3)
SODIUM: 136 meq/L — AB (ref 137–147)
TOTAL PROTEIN: 7.2 g/dL (ref 6.0–8.3)

## 2014-01-16 MED ORDER — CLOBETASOL PROPIONATE 0.05 % EX OINT: 1.0000 "application " | TOPICAL_OINTMENT | Freq: Two times a day (BID) | CUTANEOUS | Status: DC

## 2014-01-16 MED ORDER — PHENAZOPYRIDINE HCL 100 MG PO TABS: 100.0000 mg | ORAL_TABLET | Freq: Three times a day (TID) | ORAL | Status: DC | PRN

## 2014-01-16 NOTE — MAU Note (Addendum)
Waked onto unit and mother asked for someone to come in room.  Pt seeing spots and feels like she needs to eat, labs reviewed with her, vital signs taken, will report to APP.  Pt also c/c of leg cramps

## 2014-01-16 NOTE — MAU Provider Note (Signed)
History     CSN: [id number]  Arrival date and time: 01/16/14 2423   First Provider Initiated Contact with Patient 01/16/14 0804      Chief Complaint  Patient presents with  . Abdominal Pain  . Vaginal Discharge   HPI  Penny [name] is a 28 y.o. G3P1011 at [redacted]w[redacted]d who presents today with lower abdominal pain, discharge and pain with urination x 2 days. She states that [facility] goes to [facility]. She was last seen there on 01/08/14 and had an [name] that showed viable IUP. She also feels dizzy and has restless legs. Her next appointment is on 01/22/14. She has been using triamcinolone. She also reports a hx of interstitial cystitis.  Past Medical History  Diagnosis Date  . Migraines   . Interstitial cystitis   . IUD FEB 2010  . Gastritis JULY 2011  . Depression   . 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 Fatty liver   . Obesity (BMI 30-39.9) 2011 228 LBS BMI 36.8  . Iron deficiency anemia 07/23/2010  . Irritable bowel syndrome 2012 DIARRHEA    JUN 2012 TTG IgA 14.9  . Elevated liver enzymes JUL 2011ALK PHOS 111-127 AST  143-267 ALT  213-321T BILI 0.6  ALB  3.7-4.06 Jun 2011 ALK PHOS 118 AST 24 ALT 42 T BILI 0.4 ALB 3.9  . Chronic daily headache   . Ovarian cyst   . PONV (postoperative nausea and vomiting)   . ADD (attention deficit disorder)   . RLQ abdominal pain 07/18/2013  . Menorrhagia 07/18/2013  . Anxiety   . Potassium (K) deficiency   . Dysmenorrhea 09/18/2013  . Stress 09/18/2013  . Patient desires pregnancy 09/18/2013  . Pregnant 12/25/2013    Past Surgical History  Procedure Laterality Date  . Gab  2007    in [place]-POUCH 5 CM  . Cholecystectomy  2005    biliary dyskinesia  . Cath self every nite      for sodium bicarb injection (discontinued 2013)  . Colonoscopy  JUN 2012 ABD PN/DIARRHEA WITH PROPOFOL    NL COLON  . Upper gastrointestinal endoscopy  JULY 2011 NAUSEA-D125,V6, PH 25    Bx; GASTRITIS, POUCH-5 CM LONG  . Dilation and curettage of uterus    .  Tonsillectomy    . Gastric bypass  06/2006  . Savory dilation  06/20/2012    NTI:RWER gastritis/Ulcer in the mid jejunum  . Tonsillectomy and adenoidectomy    . Esophagogastroduodenoscopy      Family History  Problem Relation Age of Onset  . Hemochromatosis Maternal Grandmother   . Migraines Maternal Grandmother   . Cancer Maternal Grandmother   . Hypertension Father   . Diabetes Father   . Coronary artery disease Father   . Migraines Paternal Grandmother   . Cancer Mother     breast  . Hemochromatosis Mother   . Coronary artery disease Paternal Grandfather     History  Substance Use Topics  . Smoking status: Former Smoker -- 5 years    Types: Cigarettes  . Smokeless tobacco: Never Used  . Alcohol Use: No    Allergies:  Allergies  Allergen Reactions  . Gabapentin Other (See Comments)    PATIENT WAS UNRESPONSIVE AFTER TAKING MEDICATION, BUT VITALS REMAINED STABLE  . Metoclopramide Hcl Anxiety    Patient felt like she was trapped in a box, and cant get out.  Temporary loss of movement, weakness, tingling.    Marland Kitchen  Topamax [Topiramate] Shortness Of Breath and Other (See Comments)    Adverse Side effects, changed taste for some time, numbness and tingling in extremities.    . Codeine Nausea And Vomiting  . Latex Rash  . Tape Itching and Rash    Adhesive   Prolonged skin rash and itching.      Prescriptions prior to admission  Medication Sig Dispense Refill  . acetaminophen (TYLENOL) 500 MG tablet Take 1,000 mg by mouth every 6 (six) hours as needed for pain.      . Ascorbic Acid (VITAMIN C) 100 MG CHEW Chew 2 tablets by mouth daily.      . clonazePAM (KLONOPIN) 0.5 MG tablet TAKE 1/2 to 1 tablet tablet bid prn  90 tablet  0  . Cyanocobalamin (VITAMIN B-12) 500 MCG SUBL Place 2 tablets under the tongue daily.      . cyclobenzaprine (FLEXERIL) 10 MG tablet Take 1 tablet (10 mg total) by mouth 3 (three) times daily as needed for muscle spasms.  90 tablet  1  . docusate sodium  (COLACE) 100 MG capsule Take 100 mg by mouth 2 (two) times daily.      . ferrous fumarate (HEMOCYTE - 106 MG FE) 325 (106 FE) MG TABS tablet Take 1 tablet by mouth.      . hydrOXYzine (VISTARIL) 25 MG capsule Take 1 bid as needed  30 capsule  2  . ibuprofen (ADVIL,MOTRIN) 200 MG tablet Take 600 mg by mouth every 6 (six) hours as needed for pain.      . methylphenidate (CONCERTA) 54 MG CR tablet Take 1 tablet (54 mg total) by mouth daily.  30 tablet  0  . nortriptyline (PAMELOR) 25 MG capsule Take 25 mg by mouth at bedtime.      . Prenatal Vit-Fe Fumarate-FA (PRENATAL MULTIVITAMIN) TABS Take 1 tablet by mouth at bedtime.       . promethazine (PHENERGAN) 25 MG tablet Take 1 tablet (25 mg total) by mouth every 6 (six) hours as needed for nausea.  22 tablet  3  . Pseudoephedrine HCl (SUPHEDRIN PO) Take 2 tablets by mouth every 4 (four) hours as needed (cold).      Marland Kitchen tiZANidine (ZANAFLEX) 4 MG tablet TAKE 1 TABLET BY MOUTH EVERY 8 HOURS AS NEEDED FOR PAIN.  30 tablet  2  . triamcinolone ointment (KENALOG) 0.5 % Apply 1 application topically 2 (two) times daily.  30 g  0  . zolpidem (AMBIEN) 5 MG tablet Take 2.5 mg by mouth at bedtime as needed for sleep.        ROS Physical Exam   Blood pressure 143/77, pulse 97, temperature 98 F (36.7 C), temperature source Oral, resp. rate 18, height 5' 6.5" (1.689 m), weight 88.179 kg (194 lb 6.4 oz), last menstrual period 11/19/2013.  Physical Exam  Nursing note and vitals reviewed. Constitutional: She is oriented to person, place, and time. She appears well-developed and well-nourished. No distress.  Cardiovascular: Normal rate.   Respiratory: Effort normal.  GI: Soft. There is no tenderness.  Genitourinary:  External: excoriated and erythematous.  Vagina: small amount of white discharge Cervix: pink, smooth, no CMT Uterus: AGA  Adnexa: NT   Neurological: She is alert and oriented to person, place, and time.  Skin: Skin is warm and dry.   Psychiatric: She has a normal mood and affect.    MAU Course  Procedures  Wet prep and UA pending 0820: Care turned over to [facility], CNM  Assessment and  Plan    Mathis Bud 01/16/2014, 8:06 AM   Pt called out to report worsening dizziness and that she is seeing spots.  She again reports the leg pain with hx of restless legs.  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  She has hx of anemia and low potassium.    CBC and CMP ordered  Results for orders placed during the hospital encounter of 01/16/14 (from the past 24 hour(s))  URINALYSIS, ROUTINE W REFLEX MICROSCOPIC     Status: Abnormal   Collection Time    01/16/14  7:40 AM      Result Value Ref Range   Color, Urine YELLOW  YELLOW   APPearance CLEAR  CLEAR   Specific Gravity, Urine 1.020  1.005 - 1.030   pH 5.5  5.0 - 8.0   Glucose, UA NEGATIVE  NEGATIVE mg/dL   Hgb urine dipstick NEGATIVE  NEGATIVE   Bilirubin Urine NEGATIVE  NEGATIVE   Ketones, ur NEGATIVE  NEGATIVE mg/dL   Protein, ur NEGATIVE  NEGATIVE mg/dL   Urobilinogen, UA 0.2  0.0 - 1.0 mg/dL   Nitrite NEGATIVE  NEGATIVE   Leukocytes, UA TRACE (*) NEGATIVE  URINE MICROSCOPIC-ADD ON     Status: None   Collection Time    01/16/14  7:40 AM      Result Value Ref Range   Squamous Epithelial / LPF RARE  RARE   WBC, UA 3-6  <3 WBC/hpf   RBC / HPF 0-2  <3 RBC/hpf   Urine-Other RARE YEAST    WET PREP, GENITAL     Status: Abnormal   Collection Time    01/16/14  8:10 AM      Result Value Ref Range   Yeast Wet Prep HPF POC NONE SEEN  NONE SEEN   Trich, Wet Prep NONE SEEN  NONE SEEN   Clue Cells Wet Prep HPF POC NONE SEEN  NONE SEEN   WBC, Wet Prep HPF POC MODERATE (*) NONE SEEN  CBC     Status: Abnormal   Collection Time    01/16/14  9:08 AM      Result Value Ref Range   WBC 5.7  4.0 - 10.5 K/uL   RBC 3.55 (*) 3.87 - 5.11 MIL/uL   Hemoglobin 7.9 (*) 12.0 -  15.0 g/dL   HCT 25.3 (*) 36.0 - 46.0 %   MCV 71.3 (*) 78.0 - 100.0 fL   MCH 22.3 (*) 26.0 - 34.0 pg   MCHC 31.2  30.0 - 36.0 g/dL   RDW 19.1 (*) 11.5 - 15.5 %   Platelets 247  150 - 400 K/uL  COMPREHENSIVE METABOLIC PANEL     Status: Abnormal   Collection Time    01/16/14  9:08 AM      Result Value Ref Range   Sodium 136 (*) 137 - 147 mEq/L   Potassium 3.6 (*) 3.7 - 5.3 mEq/L   Chloride 101  96 - 112 mEq/L   CO2 23  19 - 32 mEq/L   Glucose, Bld 123 (*) 70 - 99 mg/dL   BUN 9  6 - 23 mg/dL   Creatinine, Ser 0.46 (*) 0.50 - 1.10 mg/dL   Calcium 9.5  8.4 - 10.5 mg/dL   Total Protein 7.2  6.0 - 8.3 g/dL   Albumin 4.1  3.5 - 5.2 g/dL   AST 30  0 - 37 U/L   ALT 29  0 - 35 U/L   Alkaline Phosphatase 52  39 - 117 U/L   Total Bilirubin 0.4  0.3 - 1.2 mg/dL   GFR calc non Af Amer >90  >90 mL/min   GFR calc Af Amer >90  >90 mL/min   A: 1. Chronic interstitial cystitis   2. Contact dermatitis due to detergents   3. Restless legs   4. Iron deficiency anemia of pregnancy     P: Consult Dr Ihor Dow D/C home Appointment made at [facility] for pt tomorrow at 3:45 with Dr Elonda Husky Pyridium 100 mg TID PRN Clobetasol ointment prescribed, discussed how creams sometimes cause irritation with sensitive skin and ointment may work better Note for pt to miss work tomorrow if needed Return to MAU as needed  [employer] Certified Nurse-Midwife

## 2014-01-16 NOTE — Discharge Instructions (Signed)
Contact Dermatitis Contact dermatitis is a reaction to certain substances that touch the skin. Contact dermatitis can be either irritant contact dermatitis or allergic contact dermatitis. Irritant contact dermatitis does not require previous exposure to the substance for a reaction to occur.Allergic contact dermatitis only occurs if you have been exposed to the substance before. Upon a repeat exposure, your body reacts to the substance.  CAUSES  Many substances can cause contact dermatitis. Irritant dermatitis is most commonly caused by repeated exposure to mildly irritating substances, such as:  Makeup.  Soaps.  Detergents.  Bleaches.  Acids.  Metal salts, such as nickel. Allergic contact dermatitis is most commonly caused by exposure to:  Poisonous plants.  Chemicals (deodorants, shampoos).  Jewelry.  Latex.  Neomycin in triple antibiotic cream.  Preservatives in products, including clothing. SYMPTOMS  The area of skin that is exposed may develop:  Dryness or flaking.  Redness.  Cracks.  Itching.  Pain or a burning sensation.  Blisters. With allergic contact dermatitis, there may also be swelling in areas such as the eyelids, mouth, or genitals.  DIAGNOSIS  Your caregiver can usually tell what the problem is by doing a physical exam. In cases where the cause is uncertain and an allergic contact dermatitis is suspected, a patch skin test may be performed to help determine the cause of your dermatitis. TREATMENT Treatment includes protecting the skin from further contact with the irritating substance by avoiding that substance if possible. Barrier creams, powders, and gloves may be helpful. Your caregiver may also recommend:  Steroid creams or ointments applied 2 times daily. For best results, soak the rash area in cool water for 20 minutes. Then apply the medicine. Cover the area with a plastic wrap. You can store the steroid cream in the refrigerator for a "chilly"  effect on your rash. That may decrease itching. Oral steroid medicines may be needed in more severe cases.  Antibiotics or antibacterial ointments if a skin infection is present.  Antihistamine lotion or an antihistamine taken by mouth to ease itching.  Lubricants to keep moisture in your skin.  Burow's solution to reduce redness and soreness or to dry a weeping rash. Mix one packet or tablet of solution in 2 cups cool water. Dip a clean washcloth in the mixture, wring it out a bit, and put it on the affected area. Leave the cloth in place for 30 minutes. Do this as often as possible throughout the day.  Taking several cornstarch or baking soda baths daily if the area is too large to cover with a washcloth. Harsh chemicals, such as alkalis or acids, can cause skin damage that is like a burn. You should flush your skin for 15 to 20 minutes with cold water after such an exposure. You should also seek immediate medical care after exposure. Bandages (dressings), antibiotics, and pain medicine may be needed for severely irritated skin.  HOME CARE INSTRUCTIONS  Avoid the substance that caused your reaction.  Keep the area of skin that is affected away from hot water, soap, sunlight, chemicals, acidic substances, or anything else that would irritate your skin.  Do not scratch the rash. Scratching may cause the rash to become infected.  You may take cool baths to help stop the itching.  Only take over-the-counter or prescription medicines as directed by your caregiver.  See your caregiver for follow-up care as directed to make sure your skin is healing properly. SEEK MEDICAL CARE IF:   Your condition is not better after 3  days of treatment.  You seem to be getting worse.  You see signs of infection such as swelling, tenderness, redness, soreness, or warmth in the affected area.  You have any problems related to your medicines. Document Released: 11/19/2000 Document Revised: 02/14/2012  Document Reviewed: 04/27/2011 [facility] Patient Information 2014 [place], [place]. Interstitial Cystitis Interstitial cystitis (IC) is a condition that results in discomfort or pain in the bladder and the surrounding pelvic region. The symptoms can be different from case to case and even in the same individual. People may experience:  Mild discomfort.  Pressure.  Tenderness.  Intense pain in the bladder and pelvic area. CAUSES  Because IC varies so much in symptoms and severity, people studying this disease believe it is not one but several diseases. Some caregivers use the term painful bladder syndrome (PBS) to describe cases with painful urinary symptoms. This may not meet the strictest definition of IC. The term IC / PBS includes all cases of urinary pain that cannot be connected to other causes, such as infection or urinary stones.  SYMPTOMS  Symptoms may include:  An urgent need to urinate.  A frequent need to urinate.  A combination of these symptoms. Pain may change in intensity as the bladder fills with urine or as it empties. Women's symptoms often get worse during menstruation. They may sometimes experience pain with vaginal intercourse. Some of the symptoms of IC / PBS seem like those of bacterial infection. Tests do not show infection. IC / PBS is far more common in women than in men.  DIAGNOSIS  The diagnosis of IC / PBS is based on:  Presence of pain related to the bladder, usually along with problems of frequency and urgency.  Not finding other diseases that could cause the symptoms.  Diagnostic tests that help rule out other diseases include:  Urinalysis.  Urine culture.  Cystoscopy.  Biopsy of the bladder wall.  Distension of the bladder under anesthesia.  Urine cytology.  Laboratory examination of prostate secretions. A biopsy is a tissue sample that can be looked at under a microscope. Samples of the bladder and urethra may be removed during a cystoscopy.  A biopsy helps rule out bladder cancer. TREATMENT  Scientists have not yet found a cure for IC / PBS. Patients with IC / PBS do not get better with antibiotic therapy. Caregivers cannot predict who will respond best to which treatment. Symptoms may disappear without explanation. Disappearing symptoms may coincide with an event such as a change in diet or treatment. Even when symptoms disappear, they may return after days, weeks, months, or years.  Because the causes of IC / PBS are unknown, current treatments are aimed at relieving symptoms. Many people are helped by one or a combination of the treatments. As researchers learn more about IC / PBS, the list of potential treatments will change. Patients should discuss their options with a caregiver. SURGERY  Surgery should be considered only if all available treatments have failed and the pain is disabling. Many approaches and techniques are used. Each approach has its own advantages and complications. Advantages and complications should be discussed with a urologist. Your caregiver may recommend consulting another urologist for a second opinion. Most caregivers are reluctant to operate because the outcome is unpredictable. Some people still have symptoms after surgery.  People considering surgery should discuss the potential risks and benefits, side effects, and long- and short-term complications with their family, as well as with people who have already had the procedure. Surgery  requires anesthesia, hospitalization, and in some cases weeks or months of recovery. As the complexity of the procedure increases, so do the chances for complications and for failure. HOME CARE INSTRUCTIONS   All drugs, even those sold over the counter, have side effects. Patients should always consult a caregiver before using any drug for an extended amount of time. Only take over-the-counter or prescription medicines for pain, discomfort, or fever as directed by your  caregiver.  Many patients feel that smoking makes their symptoms worse. How the by-products of tobacco that are excreted in the urine affect IC / PBS is unknown. Smoking is the major known cause of bladder cancer. One of the best things smokers can do for their bladder and their overall health is to quit.  Many patients feel that gentle stretching exercises help relieve IC / PBS symptoms.  Methods vary, but basically patients decide to empty their bladder at designated times and use relaxation techniques and distractions to keep to the schedule. Gradually, patients try to lengthen the time between scheduled voids. A diary in which to record voiding times is usually helpful in keeping track of progress. MAKE SURE YOU:   Understand these instructions.  Will watch your condition.  Will get help right away if you are not doing well or get worse. Document Released: 07/23/2004 Document Revised: 02/14/2012 Document Reviewed: 10/07/2008 [facility] Patient Information 2014 [place], [place]. Iron Deficiency Anemia, Adult Anemia is a condition in which there are less red blood cells or hemoglobin in the blood than normal. Hemoglobin is this part of red blood cells that carries oxygen. Iron deficiency anemia is anemia caused by too little iron. It is the most common type of anemia. It may leave you tired and short of breath. CAUSES   Lack of iron in the diet.  Poor absorption of iron, as seen with intestinal disorders.  Intestinal bleeding.  Heavy periods. SIGNS AND SYMPTOMS  Mild anemia may not be noticeable. Symptoms may include:  Fatigue.  Headache.  Pale skin.  Weakness.  Tiredness.  Shortness of breath.  Dizziness.  Cold hands and feet.  Fast or irregular heartbeat. DIAGNOSIS  Diagnosis requires a thorough evaluation and physical exam by your health care provider. Blood tests are generally used to confirm iron deficiency anemia. Additional tests may be done to find the  underlying cause of your anemia. These may include:  Testing for blood in the stool (fecal occult blood test).  A procedure to see inside the colon and rectum (colonoscopy).  A procedure to see inside the esophagus and stomach (endoscopy). TREATMENT  Iron deficiency anemia is treated by correcting the cause of the deficiency. Treatment may involve:  Adding iron-rich foods to your diet.  Taking iron supplements. Pregnant or breastfeeding women need to take extra iron, because their normal diet usually does not provide the required amount.  Taking vitamins. Vitamin C improves the absorption of iron. Your health care provider may recommend taking your iron tablets with a glass of orange juice or vitamin C supplement.  Medicines to make heavy menstrual flow lighter.  Surgery. HOME CARE INSTRUCTIONS   Take iron as directed by your health care provider.  If you cannot tolerate taking iron supplements by mouth, talk to your health care provider about taking them through a vein (intravenously) or an injection into a muscle.  For the best iron absorption, iron supplements should be taken on an empty stomach. If you cannot tolerate them on an empty stomach, you may need to take  them with food.  Do not drink milk or take antacids at the same time as your iron supplements. Milk and antacids may interfere with the absorption of iron.  Iron supplements can cause constipation. Make sure to include fiber in your diet to prevent constipation. A stool softener may also be recommended.  Take vitamins as directed by your health care provider.  Eat a diet rich in iron. Foods high in iron include liver, lean beef, whole-grain bread, eggs, dried fruit, and dark green, leafy vegetables. SEEK IMMEDIATE MEDICAL CARE IF:   You faint. If this happens, do not drive. Call your local emergency services (911 in [place] if no other help is available.  You have chest pain.  You feel nauseous or vomit.  You  have severe or increased shortness of breath with activity.  You feel weak.  You have a rapid heartbeat.  You have unexplained sweating.  You become lightheaded when getting up from a chair or bed. MAKE SURE YOU:   Understand these instructions.  Will watch your condition.  Will get help right away if you are not doing well or get worse. Document Released: 11/19/2000 Document Revised: 09/12/2013 Document Reviewed: 07/30/2013 [facility] Patient Information 2014 [place].

## 2014-01-16 NOTE — MAU Note (Signed)
Patient states she has been having abdominal pain for a couple of weeks but has gotten worse in the past 2 days and last night at work it was more painful in the upper and lower abdomen. States she has a vaginal discharge.

## 2014-01-16 NOTE — MAU Note (Signed)
Pt states that she has had upper and lower abdominal pain for a couple of weeks with a yellow thick discharge. Burning with urination.

## 2014-01-16 NOTE — MAU Provider Note (Signed)
Attestation of Attending Supervision of Advanced Practitioner (CNM/NP): Evaluation and management procedures were performed by the Advanced Practitioner under my supervision and collaboration.  I have reviewed the Advanced Practitioner's note and chart, and I agree with the management and plan.  HARRAWAY-SMITH, Kelsy Polack 7:22 PM

## 2014-01-17 ENCOUNTER — Ambulatory Visit (INDEPENDENT_AMBULATORY_CARE_PROVIDER_SITE_OTHER): Payer: 59 | Admitting: Obstetrics & Gynecology

## 2014-01-17 ENCOUNTER — Other Ambulatory Visit: Payer: Self-pay | Admitting: Obstetrics & Gynecology

## 2014-01-17 ENCOUNTER — Encounter (HOSPITAL_COMMUNITY): Payer: Self-pay | Admitting: *Deleted

## 2014-01-17 ENCOUNTER — Inpatient Hospital Stay (HOSPITAL_COMMUNITY)
Admission: AD | Admit: 2014-01-17 | Discharge: 2014-01-17 | Disposition: A | Discharge status: Home / Self Care | Payer: 59 | Source: Ambulatory Visit | Attending: Family Medicine | Admitting: Family Medicine

## 2014-01-17 ENCOUNTER — Encounter: Payer: Self-pay | Admitting: Obstetrics & Gynecology

## 2014-01-17 VITALS — BP 110/80 | Wt 194.0 lb

## 2014-01-17 DIAGNOSIS — F411 Generalized anxiety disorder: Secondary | ICD-10-CM | POA: Insufficient documentation

## 2014-01-17 DIAGNOSIS — G43909 Migraine, unspecified, not intractable, without status migrainosus: Secondary | ICD-10-CM | POA: Insufficient documentation

## 2014-01-17 DIAGNOSIS — Z331 Pregnant state, incidental: Secondary | ICD-10-CM

## 2014-01-17 DIAGNOSIS — O99840 Bariatric surgery status complicating pregnancy, unspecified trimester: Secondary | ICD-10-CM

## 2014-01-17 DIAGNOSIS — O99011 Anemia complicating pregnancy, first trimester: Secondary | ICD-10-CM

## 2014-01-17 DIAGNOSIS — O99340 Other mental disorders complicating pregnancy, unspecified trimester: Secondary | ICD-10-CM

## 2014-01-17 DIAGNOSIS — Z1389 Encounter for screening for other disorder: Secondary | ICD-10-CM

## 2014-01-17 DIAGNOSIS — O99019 Anemia complicating pregnancy, unspecified trimester: Secondary | ICD-10-CM

## 2014-01-17 DIAGNOSIS — O09299 Supervision of pregnancy with other poor reproductive or obstetric history, unspecified trimester: Secondary | ICD-10-CM

## 2014-01-17 DIAGNOSIS — E876 Hypokalemia: Secondary | ICD-10-CM | POA: Insufficient documentation

## 2014-01-17 DIAGNOSIS — D509 Iron deficiency anemia, unspecified: Secondary | ICD-10-CM | POA: Insufficient documentation

## 2014-01-17 DIAGNOSIS — F329 Major depressive disorder, single episode, unspecified: Secondary | ICD-10-CM | POA: Insufficient documentation

## 2014-01-17 DIAGNOSIS — F3289 Other specified depressive episodes: Secondary | ICD-10-CM | POA: Insufficient documentation

## 2014-01-17 LAB — POCT URINALYSIS DIPSTICK
Blood, UA: NEGATIVE
Glucose, UA: NEGATIVE
Ketones, UA: NEGATIVE
PROTEIN UA: NEGATIVE

## 2014-01-17 LAB — GC/CHLAMYDIA PROBE AMP
CT Probe RNA: NEGATIVE
GC Probe RNA: NEGATIVE

## 2014-01-17 MED ORDER — FERUMOXYTOL INJECTION 510 MG/17 ML
1020.0000 mg | Freq: Once | INTRAVENOUS | Status: AC
  Administered 2014-01-17: 1020 mg via INTRAVENOUS
  Filled 2014-01-17: qty 34

## 2014-01-17 MED ORDER — POTASSIUM CHLORIDE CRYS ER 20 MEQ PO TBCR
20.0000 meq | EXTENDED_RELEASE_TABLET | Freq: Once | ORAL | Status: AC
  Administered 2014-01-17: 20 meq via ORAL
  Filled 2014-01-17: qty 1

## 2014-01-17 MED ORDER — POTASSIUM CHLORIDE ER 10 MEQ PO TBCR: 10.0000 meq | EXTENDED_RELEASE_TABLET | Freq: Every day | ORAL | Status: DC

## 2014-01-17 MED ORDER — SODIUM CHLORIDE 0.9 % IV SOLN
INTRAVENOUS | Status: DC
  Administered 2014-01-17: 19:00:00 via INTRAVENOUS

## 2014-01-17 NOTE — Discharge Instructions (Signed)
Iron Deficiency Anemia, Adult  Anemia is a condition in which there are less red blood cells or hemoglobin in the blood than normal. Hemoglobin is this part of red blood cells that carries oxygen. Iron deficiency anemia is anemia caused by too little iron. It is the most common type of anemia. It may leave you tired and short of breath.  CAUSES    Lack of iron in the diet.   Poor absorption of iron, as seen with intestinal disorders.   Intestinal bleeding.   Heavy periods.  SIGNS AND SYMPTOMS   Mild anemia may not be noticeable. Symptoms may include:   Fatigue.   Headache.   Pale skin.   Weakness.   Tiredness.   Shortness of breath.   Dizziness.   Cold hands and feet.   Fast or irregular heartbeat.  DIAGNOSIS   Diagnosis requires a thorough evaluation and physical exam by your health care provider. Blood tests are generally used to confirm iron deficiency anemia. Additional tests may be done to find the underlying cause of your anemia. These may include:   Testing for blood in the stool (fecal occult blood test).   A procedure to see inside the colon and rectum (colonoscopy).   A procedure to see inside the esophagus and stomach (endoscopy).  TREATMENT   Iron deficiency anemia is treated by correcting the cause of the deficiency. Treatment may involve:   Adding iron-rich foods to your diet.   Taking iron supplements. Pregnant or breastfeeding women need to take extra iron, because their normal diet usually does not provide the required amount.   Taking vitamins. Vitamin C improves the absorption of iron. Your health care provider may recommend taking your iron tablets with a glass of orange juice or vitamin C supplement.   Medicines to make heavy menstrual flow lighter.   Surgery.  HOME CARE INSTRUCTIONS    Take iron as directed by your health care provider.   If you cannot tolerate taking iron supplements by mouth, talk to your health care provider about taking them through a vein  (intravenously) or an injection into a muscle.   For the best iron absorption, iron supplements should be taken on an empty stomach. If you cannot tolerate them on an empty stomach, you may need to take them with food.   Do not drink milk or take antacids at the same time as your iron supplements. Milk and antacids may interfere with the absorption of iron.   Iron supplements can cause constipation. Make sure to include fiber in your diet to prevent constipation. A stool softener may also be recommended.   Take vitamins as directed by your health care provider.   Eat a diet rich in iron. Foods high in iron include liver, lean beef, whole-grain bread, eggs, dried fruit, and dark green, leafy vegetables.  SEEK IMMEDIATE MEDICAL CARE IF:    You faint. If this happens, do not drive. Call your local emergency services (911 in [place] if no other help is available.   You have chest pain.   You feel nauseous or vomit.   You have severe or increased shortness of breath with activity.   You feel weak.   You have a rapid heartbeat.   You have unexplained sweating.   You become lightheaded when getting up from a chair or bed.  MAKE SURE YOU:    Understand these instructions.   Will watch your condition.   Will get help right away if you are not 

## 2014-01-17 NOTE — Progress Notes (Signed)
Pt is really very dizzy and weak sleeping constantly Of course 1st trimester but also quite anemic from non absorption, low ferritin, low mcv, high rdw, will proceed with Iv iron Keep scheduled Supplement K+ Pt to figure out FMLA situation

## 2014-01-17 NOTE — MAU Note (Signed)
Pt here for iron infusion.

## 2014-01-17 NOTE — MAU Provider Note (Signed)
Attestation of Attending Supervision of Advanced Practitioner (PA/CNM/NP): Evaluation and management procedures were performed by the Advanced Practitioner under my supervision and collaboration.  I have reviewed the Advanced Practitioner's note and chart, and I agree with the management and plan.  Donnamae Jude, MD [facility] Attending 01/17/2014 11:09 PM

## 2014-01-17 NOTE — MAU Provider Note (Signed)
None     Chief Complaint:  Anemia   Penny [name] is  28 y.o. G3P1011 at [redacted]w[redacted]d presents complaining of Anemia She was sent from the office for and IV iron infusion Obstetrical/Gynecological History: OB History   Grav Para Term Preterm Abortions TAB SAB Ect Mult Living   '3 1 1  1  1   1     '$ Past Medical History: Past Medical History  Diagnosis Date  . Migraines   . Interstitial cystitis   . IUD FEB 2010  . Gastritis JULY 2011  . Depression   . 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 Fatty liver   . Obesity (BMI 30-39.9) 2011 228 LBS BMI 36.8  . Iron deficiency anemia 07/23/2010  . Irritable bowel syndrome 2012 DIARRHEA    JUN 2012 TTG IgA 14.9  . Elevated liver enzymes JUL 2011ALK PHOS 111-127 AST  143-267 ALT  213-321T BILI 0.6  ALB  3.7-4.06 Jun 2011 ALK PHOS 118 AST 24 ALT 42 T BILI 0.4 ALB 3.9  . Chronic daily headache   . Ovarian cyst   . PONV (postoperative nausea and vomiting)   . ADD (attention deficit disorder)   . RLQ abdominal pain 07/18/2013  . Menorrhagia 07/18/2013  . Anxiety   . Potassium (K) deficiency   . Dysmenorrhea 09/18/2013  . Stress 09/18/2013  . Patient desires pregnancy 09/18/2013  . Pregnant 12/25/2013    Past Surgical History: Past Surgical History  Procedure Laterality Date  . Gab  2007    in [place]-POUCH 5 CM  . Cholecystectomy  2005    biliary dyskinesia  . Cath self every nite      for sodium bicarb injection (discontinued 2013)  . Colonoscopy  JUN 2012 ABD PN/DIARRHEA WITH PROPOFOL    NL COLON  . Upper gastrointestinal endoscopy  JULY 2011 NAUSEA-D125,V6, PH 25    Bx; GASTRITIS, POUCH-5 CM LONG  . Dilation and curettage of uterus    . Tonsillectomy    . Gastric bypass  06/2006  . Savory dilation  06/20/2012    DXA:JOIN gastritis/Ulcer in the mid jejunum  . Tonsillectomy and adenoidectomy    . Esophagogastroduodenoscopy      Family History: Family History  Problem Relation Age of Onset  . Hemochromatosis Maternal  Grandmother   . Migraines Maternal Grandmother   . Cancer Maternal Grandmother   . Hypertension Father   . Diabetes Father   . Coronary artery disease Father   . Migraines Paternal Grandmother   . Cancer Mother     breast  . Hemochromatosis Mother   . Coronary artery disease Paternal Grandfather     Social History: History  Substance Use Topics  . Smoking status: Former Smoker -- 5 years    Types: Cigarettes  . Smokeless tobacco: Never Used  . Alcohol Use: No    Allergies:  Allergies  Allergen Reactions  . Gabapentin Other (See Comments)    PATIENT WAS UNRESPONSIVE AFTER TAKING MEDICATION, BUT VITALS REMAINED STABLE  . Metoclopramide Hcl Anxiety    Patient felt like she was trapped in a box, and cant get out.  Temporary loss of movement, weakness, tingling.    . Topamax [Topiramate] Shortness Of Breath and Other (See Comments)    Adverse Side effects, changed taste for some time, numbness and tingling in extremities.    . Codeine Nausea And Vomiting  . Latex Rash  . Tape Itching and Rash  Adhesive   Prolonged skin rash and itching.      Meds:  Prescriptions prior to admission  Medication Sig Dispense Refill  . acetaminophen (TYLENOL) 500 MG tablet Take 1,000 mg by mouth every 6 (six) hours as needed for pain.      . Ascorbic Acid (VITAMIN C) 100 MG CHEW Chew 2 tablets by mouth daily.      . calcium carbonate (TUMS - DOSED IN MG ELEMENTAL CALCIUM) 500 MG chewable tablet Chew 3 tablets by mouth daily as needed for indigestion or heartburn.      . clonazePAM (KLONOPIN) 0.5 MG tablet TAKE 1/2 to 1 tablet tablet bid prn  90 tablet  0  . Cyanocobalamin (VITAMIN B-12) 500 MCG SUBL Place 2 tablets under the tongue daily.      Marland Kitchen docusate sodium (COLACE) 100 MG capsule Take 100 mg by mouth 2 (two) times daily.      . ferrous fumarate (HEMOCYTE - 106 MG FE) 325 (106 FE) MG TABS tablet Take 1 tablet by mouth.      . hydrOXYzine (VISTARIL) 25 MG capsule Take 1 bid as needed  30  capsule  2  . ibuprofen (ADVIL,MOTRIN) 200 MG tablet Take 600 mg by mouth every 6 (six) hours as needed for pain.      . Prenatal Vit-Fe Fumarate-FA (PRENATAL MULTIVITAMIN) TABS Take 1 tablet by mouth at bedtime.       . promethazine (PHENERGAN) 25 MG tablet Take 1 tablet (25 mg total) by mouth every 6 (six) hours as needed for nausea.  22 tablet  3  . clobetasol ointment (TEMOVATE) 2.37 % Apply 1 application topically 2 (two) times daily. Apply to affected area  30 g  2  . phenazopyridine (PYRIDIUM) 100 MG tablet Take 1 tablet (100 mg total) by mouth 3 (three) times daily as needed for pain.  10 tablet  0  . potassium chloride (K-DUR) 10 MEQ tablet Take 1 tablet (10 mEq total) by mouth daily.  30 tablet  11    Review of Systems   Constitutional: Negative for fever and chills Eyes: Negative for visual disturbances Respiratory: Negative for shortness of breath, dyspnea Cardiovascular: Negative for chest pain or palpitations  Gastrointestinal: Negative for vomiting, diarrhea and constipation Genitourinary: Negative for dysuria and urgency Musculoskeletal: Negative for back pain, joint pain, myalgias ; Has restless leg syndrome Neurological: Negative for dizziness and headaches    Physical Exam  Blood pressure 125/76, pulse 98, temperature 98.1 F (36.7 C), temperature source Oral, resp. rate 18, height $RemoveBe'5\' 6"'fzESBwnaW$  (1.676 m), weight 87.635 kg (193 lb 3.2 oz), last menstrual period 11/19/2013, SpO2 100.00%. GENERAL: Well-developed, well-nourished female in no acute distress. Pale, feels weak.   Labs: Results for orders placed in visit on 01/17/14 (from the past 24 hour(s))  POCT URINALYSIS DIPSTICK   Collection Time    01/17/14  4:22 PM      Result Value Ref Range   Color, UA       Clarity, UA       Glucose, UA neg     Bilirubin, UA       Ketones, UA neg     Spec Grav, UA       Blood, UA neg     pH, UA       Protein, UA neg     Urobilinogen, UA       Nitrite, UA ne     Leukocytes, UA  small (1+)      Assessment:  Penny [name] is  28 y.o. G3P1011 at [redacted]w[redacted]d presents with anemia, borderline low potassium.  Plan: IV iron infusion KDur X 1 PO now (pharmacy will be closed by the time she gets home)  CRESENZO-DISHMAN,Juanetta Negash 2/12/20156:56 PM

## 2014-01-20 ENCOUNTER — Observation Stay (HOSPITAL_COMMUNITY)
Admission: AD | Admit: 2014-01-20 | Discharge: 2014-01-21 | Disposition: A | Discharge status: Home / Self Care | Payer: 59 | Source: Ambulatory Visit | Attending: Obstetrics and Gynecology | Admitting: Obstetrics and Gynecology

## 2014-01-20 ENCOUNTER — Encounter (HOSPITAL_COMMUNITY): Payer: Self-pay

## 2014-01-20 ENCOUNTER — Inpatient Hospital Stay (HOSPITAL_COMMUNITY): Payer: 59

## 2014-01-20 DIAGNOSIS — O99019 Anemia complicating pregnancy, unspecified trimester: Principal | ICD-10-CM | POA: Insufficient documentation

## 2014-01-20 DIAGNOSIS — E860 Dehydration: Secondary | ICD-10-CM

## 2014-01-20 DIAGNOSIS — O219 Vomiting of pregnancy, unspecified: Secondary | ICD-10-CM

## 2014-01-20 DIAGNOSIS — O208 Other hemorrhage in early pregnancy: Secondary | ICD-10-CM | POA: Insufficient documentation

## 2014-01-20 DIAGNOSIS — K589 Irritable bowel syndrome without diarrhea: Secondary | ICD-10-CM | POA: Insufficient documentation

## 2014-01-20 DIAGNOSIS — R42 Dizziness and giddiness: Secondary | ICD-10-CM | POA: Insufficient documentation

## 2014-01-20 DIAGNOSIS — I951 Orthostatic hypotension: Secondary | ICD-10-CM

## 2014-01-20 DIAGNOSIS — D509 Iron deficiency anemia, unspecified: Secondary | ICD-10-CM

## 2014-01-20 DIAGNOSIS — K7689 Other specified diseases of liver: Secondary | ICD-10-CM | POA: Insufficient documentation

## 2014-01-20 DIAGNOSIS — R079 Chest pain, unspecified: Secondary | ICD-10-CM | POA: Insufficient documentation

## 2014-01-20 DIAGNOSIS — Z87891 Personal history of nicotine dependence: Secondary | ICD-10-CM | POA: Insufficient documentation

## 2014-01-20 DIAGNOSIS — D649 Anemia, unspecified: Secondary | ICD-10-CM | POA: Diagnosis present

## 2014-01-20 DIAGNOSIS — Z3490 Encounter for supervision of normal pregnancy, unspecified, unspecified trimester: Secondary | ICD-10-CM

## 2014-01-20 LAB — COMPREHENSIVE METABOLIC PANEL WITH GFR
ALT: 22 U/L (ref 0–35)
AST: 21 U/L (ref 0–37)
Albumin: 3.9 g/dL (ref 3.5–5.2)
Alkaline Phosphatase: 55 U/L (ref 39–117)
BUN: 7 mg/dL (ref 6–23)
CO2: 23 meq/L (ref 19–32)
Calcium: 9.9 mg/dL (ref 8.4–10.5)
Chloride: 102 meq/L (ref 96–112)
Creatinine, Ser: 0.41 mg/dL — ABNORMAL LOW (ref 0.50–1.10)
GFR calc Af Amer: >90 mL/min
GFR calc non Af Amer: >90 mL/min
Glucose, Bld: 98 mg/dL (ref 70–99)
Potassium: 4.2 meq/L (ref 3.7–5.3)
Sodium: 140 meq/L (ref 137–147)
Total Bilirubin: 0.2 mg/dL — ABNORMAL LOW (ref 0.3–1.2)
Total Protein: 6.8 g/dL (ref 6.0–8.3)

## 2014-01-20 LAB — URINALYSIS, ROUTINE W REFLEX MICROSCOPIC
BILIRUBIN URINE: NEGATIVE
Glucose, UA: NEGATIVE mg/dL
Hgb urine dipstick: NEGATIVE
Ketones, ur: NEGATIVE mg/dL
NITRITE: NEGATIVE
Protein, ur: NEGATIVE mg/dL
Specific Gravity, Urine: <1.005 — ABNORMAL LOW (ref 1.005–1.030)
UROBILINOGEN UA: 0.2 mg/dL (ref 0.0–1.0)
pH: 7 (ref 5.0–8.0)

## 2014-01-20 LAB — URINE MICROSCOPIC-ADD ON

## 2014-01-20 LAB — CBC
HCT: 26 % — ABNORMAL LOW (ref 36.0–46.0)
Hemoglobin: 8.2 g/dL — ABNORMAL LOW (ref 12.0–15.0)
MCH: 22.5 pg — ABNORMAL LOW (ref 26.0–34.0)
MCHC: 31.5 g/dL (ref 30.0–36.0)
MCV: 71.4 fL — ABNORMAL LOW (ref 78.0–100.0)
Platelets: 252 10*3/uL (ref 150–400)
RBC: 3.64 MIL/uL — ABNORMAL LOW (ref 3.87–5.11)
RDW: 19.2 % — ABNORMAL HIGH (ref 11.5–15.5)
WBC: 5.2 10*3/uL (ref 4.0–10.5)

## 2014-01-20 LAB — PREPARE RBC (CROSSMATCH)

## 2014-01-20 MED ORDER — SODIUM CHLORIDE 0.9 % IV SOLN
250.0000 mL | INTRAVENOUS | Status: DC | PRN
  Administered 2014-01-20: 250 mL via INTRAVENOUS

## 2014-01-20 MED ORDER — ACETAMINOPHEN 500 MG PO TABS
1000.0000 mg | ORAL_TABLET | Freq: Once | ORAL | Status: AC
  Administered 2014-01-20: 1000 mg via ORAL
  Filled 2014-01-20: qty 2

## 2014-01-20 MED ORDER — LACTATED RINGERS IV BOLUS (SEPSIS)
1000.0000 mL | Freq: Once | INTRAVENOUS | Status: AC
  Administered 2014-01-20: 1000 mL via INTRAVENOUS

## 2014-01-20 MED ORDER — PROMETHAZINE HCL 25 MG/ML IJ SOLN
12.5000 mg | Freq: Once | INTRAMUSCULAR | Status: AC
  Administered 2014-01-20: 25 mg via INTRAVENOUS
  Filled 2014-01-20: qty 1

## 2014-01-20 MED ORDER — PROMETHAZINE HCL 25 MG RE SUPP: 25.0000 mg | Freq: Four times a day (QID) | RECTAL | Status: DC | PRN

## 2014-01-20 MED ORDER — SODIUM CHLORIDE 0.9 % IJ SOLN
3.0000 mL | INTRAMUSCULAR | Status: DC | PRN
  Administered 2014-01-20: 3 mL via INTRAVENOUS

## 2014-01-20 MED ORDER — PRENATAL MULTIVITAMIN CH: 1.0000 | ORAL_TABLET | Freq: Every day | ORAL | Status: DC

## 2014-01-20 MED ORDER — SODIUM CHLORIDE 0.9 % IJ SOLN: 3.0000 mL | Freq: Two times a day (BID) | INTRAMUSCULAR | Status: DC

## 2014-01-20 NOTE — Discharge Instructions (Signed)
Pregnancy - First Trimester  During sexual intercourse, millions of sperm go into the vagina. Only 1 sperm will penetrate and fertilize the female egg while it is in the Fallopian tube. One week later, the fertilized egg implants into the wall of the uterus. An embryo begins to develop into a baby. At 6 to 8 weeks, the eyes and face are formed and the heartbeat can be seen on ultrasound. At the end of 12 weeks (first trimester), all the baby's organs are formed. Now that you are pregnant, you will want to do everything you can to have a healthy baby. Two of the most important things are to get good prenatal care and follow your caregiver's instructions. Prenatal care is all the medical care you receive before the baby's birth. It is given to prevent, find, and treat problems during the pregnancy and childbirth.  PRENATAL EXAMS  · During prenatal visits, your weight, blood pressure, and urine are checked. This is done to make sure you are healthy and progressing normally during the pregnancy.  · A pregnant woman should gain 25 to 35 pounds during the pregnancy. However, if you are overweight or underweight, your caregiver will advise you regarding your weight.  · Your caregiver will ask and answer questions for you.  · Blood work, cervical cultures, other necessary tests, and a Pap test are done during your prenatal exams. These tests are done to check on your health and the probable health of your baby. Tests are strongly recommended and done for HIV with your permission. This is the virus that causes AIDS. These tests are done because medicines can be given to help prevent your baby from being born with this infection should you have been infected without knowing it. Blood work is also used to find out your blood type, previous infections, and follow your blood levels (hemoglobin).  · Low hemoglobin (anemia) is common during pregnancy. Iron and vitamins are given to help prevent this. Later in the pregnancy, blood  tests for diabetes will be done along with any other tests if any problems develop.  · You may need other tests to make sure you and the baby are doing well.  CHANGES DURING THE FIRST TRIMESTER   Your body goes through many changes during pregnancy. They vary from person to person. Talk to your caregiver about changes you notice and are concerned about. Changes can include:  · Your menstrual period stops.  · The egg and sperm carry the genes that determine what you look like. Genes from you and your partner are forming a baby. The female genes determine whether the baby is a boy or a girl.  · Your body increases in girth and you may feel bloated.  · Feeling sick to your stomach (nauseous) and throwing up (vomiting). If the vomiting is uncontrollable, call your caregiver.  · Your breasts will begin to enlarge and become tender.  · Your nipples may stick out more and become darker.  · The need to urinate more. Painful urination may mean you have a bladder infection.  · Tiring easily.  · Loss of appetite.  · Cravings for certain kinds of food.  · At first, you may gain or lose a couple of pounds.  · You may have changes in your emotions from day to day (excited to be pregnant or concerned something may go wrong with the pregnancy and baby).  · You may have more vivid and strange dreams.  HOME CARE INSTRUCTIONS   ·   It is very important to avoid all smoking, alcohol and non-prescribed drugs during your pregnancy. These affect the formation and growth of the baby. Avoid chemicals while pregnant to ensure the delivery of a healthy infant.  · Start your prenatal visits by the 12th week of pregnancy. They are usually scheduled monthly at first, then more often in the last 2 months before delivery. Keep your caregiver's appointments. Follow your caregiver's instructions regarding medicine use, blood and lab tests, exercise, and diet.  · During pregnancy, you are providing food for you and your baby. Eat regular, well-balanced  meals. Choose foods such as meat, fish, milk and other low fat dairy products, vegetables, fruits, and whole-grain breads and cereals. Your caregiver will tell you of the ideal weight gain.  · You can help morning sickness by keeping soda crackers at the bedside. Eat a couple before arising in the morning. You may want to use the crackers without salt on them.  · Eating 4 to 5 small meals rather than 3 large meals a day also may help the nausea and vomiting.  · Drinking liquids between meals instead of during meals also seems to help nausea and vomiting.  · A physical sexual relationship may be continued throughout pregnancy if there are no other problems. Problems may be early (premature) leaking of amniotic fluid from the membranes, vaginal bleeding, or belly (abdominal) pain.  · Exercise regularly if there are no restrictions. Check with your caregiver or physical therapist if you are unsure of the safety of some of your exercises. Greater weight gain will occur in the last 2 trimesters of pregnancy. Exercising will help:  · Control your weight.  · Keep you in shape.  · Prepare you for labor and delivery.  · Help you lose your pregnancy weight after you deliver your baby.  · Wear a good support or jogging bra for breast tenderness during pregnancy. This may help if worn during sleep too.  · Ask when prenatal classes are available. Begin classes when they are offered.  · Do not use hot tubs, steam rooms, or saunas.  · Wear your seat belt when driving. This protects you and your baby if you are in an accident.  · Avoid raw meat, uncooked cheese, cat litter boxes, and soil used by cats throughout the pregnancy. These carry germs that can cause birth defects in the baby.  · The first trimester is a good time to visit your dentist for your dental health. Getting your teeth cleaned is okay. Use a softer toothbrush and brush gently during pregnancy.  · Ask for help if you have financial, counseling, or nutritional needs  during pregnancy. Your caregiver will be able to offer counseling for these needs as well as refer you for other special needs.  · Do not take any medicines or herbs unless told by your caregiver.  · Inform your caregiver if there is any mental or physical domestic violence.  · Make a list of emergency phone numbers of family, friends, hospital, and police and fire departments.  · Write down your questions. Take them to your prenatal visit.  · Do not douche.  · Do not cross your legs.  · If you have to stand for long periods of time, rotate you feet or take small steps in a circle.  · You may have more vaginal secretions that may require a sanitary pad. Do not use tampons or scented sanitary pads.  MEDICINES AND DRUG USE IN PREGNANCY  ·   Take prenatal vitamins as directed. The vitamin should contain 1 milligram of folic acid. Keep all vitamins out of reach of children. Only a couple vitamins or tablets containing iron may be fatal to a baby or young child when ingested.  · Avoid use of all medicines, including herbs, over-the-counter medicines, not prescribed or suggested by your caregiver. Only take over-the-counter or prescription medicines for pain, discomfort, or fever as directed by your caregiver. Do not use aspirin, ibuprofen, or naproxen unless directed by your caregiver.  · Let your caregiver also know about herbs you may be using.  · Alcohol is related to a number of birth defects. This includes fetal alcohol syndrome. All alcohol, in any form, should be avoided completely. Smoking will cause low birth rate and premature babies.  · Street or illegal drugs are very harmful to the baby. They are absolutely forbidden. A baby born to an addicted mother will be addicted at birth. The baby will go through the same withdrawal an adult does.  · Let your caregiver know about any medicines that you have to take and for what reason you take them.  SEEK MEDICAL CARE IF:   You have any concerns or worries during your  pregnancy. It is better to call with your questions if you feel they cannot wait, rather than worry about them.  SEEK IMMEDIATE MEDICAL CARE IF:   · An unexplained oral temperature above 102° F (38.9° C) develops, or as your caregiver suggests.  · You have leaking of fluid from the vagina (birth canal). If leaking membranes are suspected, take your temperature and inform your caregiver of this when you call.  · There is vaginal spotting or bleeding. Notify your caregiver of the amount and how many pads are used.  · You develop a bad smelling vaginal discharge with a change in the color.  · You continue to feel sick to your stomach (nauseated) and have no relief from remedies suggested. You vomit blood or coffee ground-like materials.  · You lose more than 2 pounds of weight in 1 week.  · You gain more than 2 pounds of weight in 1 week and you notice swelling of your face, hands, feet, or legs.  · You gain 5 pounds or more in 1 week (even if you do not have swelling of your hands, face, legs, or feet).  · You get exposed to German measles and have never had them.  · You are exposed to fifth disease or chickenpox.  · You develop belly (abdominal) pain. Round ligament discomfort is a common non-cancerous (benign) cause of abdominal pain in pregnancy. Your caregiver still must evaluate this.  · You develop headache, fever, diarrhea, pain with urination, or shortness of breath.  · You fall or are in a car accident or have any kind of trauma.  · There is mental or physical violence in your home.  Document Released: 11/16/2001 Document Revised: 08/16/2012 Document Reviewed: 05/20/2009  [employer]® Patient Information ©2014 [employer].

## 2014-01-20 NOTE — MAU Provider Note (Signed)
History     CSN: [id number]  Arrival date and time: 01/20/14 1816   First Provider Initiated Contact with Patient 01/20/14 1900      Chief Complaint  Patient presents with  . Dizziness   HPI 28 y.o. G3P1011 at [redacted]w[redacted]d with dizziness. Ongoing severe iron deficiency anemia d/t malabsorption s/p gastric bypass. Sent to MAU on 2/12 by Dr. Elonda Husky d/t severe dizziness and fatigue, Hgb 7.9, received IV iron infusion at that time. States she has only felt worse since then. Having n/v, unable to keep much down, PO phenergan not helping. Has headaches with Zofran and severe anxiety reaction with reglan. Has only had about 4 oz of juice and a "little cheese and crackers" all day today. Having some menstrual type cramping but no vaginal bleeding.   Past Medical History  Diagnosis Date  . Migraines   . Interstitial cystitis   . IUD FEB 2010  . Gastritis JULY 2011  . Depression   . 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 Fatty liver   . Obesity (BMI 30-39.9) 2011 228 LBS BMI 36.8  . Iron deficiency anemia 07/23/2010  . Irritable bowel syndrome 2012 DIARRHEA    JUN 2012 TTG IgA 14.9  . Elevated liver enzymes JUL 2011ALK PHOS 111-127 AST  143-267 ALT  213-321T BILI 0.6  ALB  3.7-4.06 Jun 2011 ALK PHOS 118 AST 24 ALT 42 T BILI 0.4 ALB 3.9  . Chronic daily headache   . Ovarian cyst   . PONV (postoperative nausea and vomiting)   . ADD (attention deficit disorder)   . RLQ abdominal pain 07/18/2013  . Menorrhagia 07/18/2013  . Anxiety   . Potassium (K) deficiency   . Dysmenorrhea 09/18/2013  . Stress 09/18/2013  . Patient desires pregnancy 09/18/2013  . Pregnant 12/25/2013    Past Surgical History  Procedure Laterality Date  . Gab  2007    in [place]-POUCH 5 CM  . Cholecystectomy  2005    biliary dyskinesia  . Cath self every nite      for sodium bicarb injection (discontinued 2013)  . Colonoscopy  JUN 2012 ABD PN/DIARRHEA WITH PROPOFOL    NL COLON  . Upper gastrointestinal endoscopy   JULY 2011 NAUSEA-D125,V6, PH 25    Bx; GASTRITIS, POUCH-5 CM LONG  . Dilation and curettage of uterus    . Tonsillectomy    . Gastric bypass  06/2006  . Savory dilation  06/20/2012    EVO:JJKK gastritis/Ulcer in the mid jejunum  . Tonsillectomy and adenoidectomy    . Esophagogastroduodenoscopy      Family History  Problem Relation Age of Onset  . Hemochromatosis Maternal Grandmother   . Migraines Maternal Grandmother   . Cancer Maternal Grandmother   . Hypertension Father   . Diabetes Father   . Coronary artery disease Father   . Migraines Paternal Grandmother   . Cancer Mother     breast  . Hemochromatosis Mother   . Coronary artery disease Paternal Grandfather     History  Substance Use Topics  . Smoking status: Former Smoker -- 5 years    Types: Cigarettes  . Smokeless tobacco: Never Used  . Alcohol Use: No    Allergies:  Allergies  Allergen Reactions  . Gabapentin Other (See Comments)    PATIENT WAS UNRESPONSIVE AFTER TAKING MEDICATION, BUT VITALS REMAINED STABLE  . Metoclopramide Hcl Anxiety    Patient felt like she was trapped in a box,  and cant get out.  Temporary loss of movement, weakness, tingling.    . Topamax [Topiramate] Shortness Of Breath and Other (See Comments)    Adverse Side effects, changed taste for some time, numbness and tingling in extremities.    . Codeine Nausea And Vomiting  . Latex Rash  . Tape Itching and Rash    Adhesive   Prolonged skin rash and itching.      Prescriptions prior to admission  Medication Sig Dispense Refill  . acetaminophen (TYLENOL) 500 MG tablet Take 1,000 mg by mouth every 6 (six) hours as needed for pain.      . Ascorbic Acid (VITAMIN C) 100 MG CHEW Chew 2 tablets by mouth daily.      . calcium carbonate (TUMS - DOSED IN MG ELEMENTAL CALCIUM) 500 MG chewable tablet Chew 3 tablets by mouth daily as needed for indigestion or heartburn.      . Cholecalciferol (VITAMIN D3) 2000 UNITS TABS Take 1 tablet by mouth daily.       . clobetasol ointment (TEMOVATE) 7.42 % Apply 1 application topically 2 (two) times daily. Apply to affected area  30 g  2  . clonazePAM (KLONOPIN) 0.5 MG tablet TAKE 1/2 to 1 tablet tablet bid prn  90 tablet  0  . Cyanocobalamin (VITAMIN B-12) 500 MCG SUBL Place 2 tablets under the tongue daily.      Marland Kitchen docusate sodium (COLACE) 100 MG capsule Take 100 mg by mouth 2 (two) times daily.      . ferrous fumarate (HEMOCYTE - 106 MG FE) 325 (106 FE) MG TABS tablet Take 1 tablet by mouth.      . hydrOXYzine (VISTARIL) 25 MG capsule Take 1 bid as needed  30 capsule  2  . ibuprofen (ADVIL,MOTRIN) 200 MG tablet Take 600 mg by mouth every 6 (six) hours as needed for pain.      Marland Kitchen OVER THE COUNTER MEDICATION Take 1 tablet by mouth daily. Potassium, magnesium, calcium combination.      . potassium chloride (K-DUR) 10 MEQ tablet Take 1 tablet (10 mEq total) by mouth daily.  30 tablet  11  . Prenatal Vit-Fe Fumarate-FA (PRENATAL MULTIVITAMIN) TABS Take 1 tablet by mouth at bedtime.       . promethazine (PHENERGAN) 25 MG tablet Take 1 tablet (25 mg total) by mouth every 6 (six) hours as needed for nausea.  22 tablet  3    Review of Systems  Constitutional: Positive for malaise/fatigue.  Respiratory: Negative.   Cardiovascular: Negative.   Gastrointestinal: Negative for nausea, vomiting, abdominal pain, diarrhea and constipation.  Genitourinary: Negative for dysuria, urgency, frequency, hematuria and flank pain.       + cramping  Musculoskeletal: Negative.   Neurological: Positive for dizziness, weakness and headaches.  Psychiatric/Behavioral: Negative.    Physical Exam   Blood pressure 97/58, pulse 88, temperature 98 F (36.7 C), temperature source Oral, resp. rate 20, height $RemoveBe'5\' 6"'jJEaGlwXR$  (1.676 m), weight 195 lb (88.451 kg), last menstrual period 11/19/2013, SpO2 98.00%.  Patient Vitals for the past 24 hrs:  BP Temp Temp src Pulse Resp SpO2 Height Weight  01/20/14 1847 - - - 88 - 98 % - -  01/20/14  1842 97/58 mmHg - - 144 20 100 % - -  01/20/14 1841 122/80 mmHg - - 114 - - - -  01/20/14 1839 123/60 mmHg - - 102 - 99 % - -  01/20/14 1837 - - - - - - $R'5\' 6"'ww$  (1.676 m)  195 lb (88.451 kg)  01/20/14 1824 123/60 mmHg 98 F (36.7 C) Oral 100 18 - - -    Physical Exam  Nursing note and vitals reviewed. Constitutional: She is oriented to person, place, and time. She appears well-developed. She has a sickly appearance.  Cardiovascular: Normal rate.   Respiratory: Effort normal. No respiratory distress.  Musculoskeletal: Normal range of motion.  Neurological: She is alert and oriented to person, place, and time.  Skin: Skin is warm and dry. There is pallor.  Psychiatric: She has a normal mood and affect.    MAU Course  Procedures  Results for orders placed during the hospital encounter of 01/20/14 (from the past 24 hour(s))  CBC     Status: Abnormal   Collection Time    01/20/14  6:28 PM      Result Value Ref Range   WBC 5.2  4.0 - 10.5 K/uL   RBC 3.64 (*) 3.87 - 5.11 MIL/uL   Hemoglobin 8.2 (*) 12.0 - 15.0 g/dL   HCT 26.0 (*) 36.0 - 46.0 %   MCV 71.4 (*) 78.0 - 100.0 fL   MCH 22.5 (*) 26.0 - 34.0 pg   MCHC 31.5  30.0 - 36.0 g/dL   RDW 19.2 (*) 11.5 - 15.5 %   Platelets 252  150 - 400 K/uL  COMPREHENSIVE METABOLIC PANEL     Status: Abnormal   Collection Time    01/20/14  6:28 PM      Result Value Ref Range   Sodium 140  137 - 147 mEq/L   Potassium 4.2  3.7 - 5.3 mEq/L   Chloride 102  96 - 112 mEq/L   CO2 23  19 - 32 mEq/L   Glucose, Bld 98  70 - 99 mg/dL   BUN 7  6 - 23 mg/dL   Creatinine, Ser 0.41 (*) 0.50 - 1.10 mg/dL   Calcium 9.9  8.4 - 10.5 mg/dL   Total Protein 6.8  6.0 - 8.3 g/dL   Albumin 3.9  3.5 - 5.2 g/dL   AST 21  0 - 37 U/L   ALT 22  0 - 35 U/L   Alkaline Phosphatase 55  39 - 117 U/L   Total Bilirubin 0.2 (*) 0.3 - 1.2 mg/dL   GFR calc non Af Amer >90  >90 mL/min   GFR calc Af Amer >90  >90 mL/min  URINALYSIS, ROUTINE W REFLEX MICROSCOPIC     Status:  Abnormal   Collection Time    01/20/14  7:25 PM      Result Value Ref Range   Color, Urine YELLOW  YELLOW   APPearance CLEAR  CLEAR   Specific Gravity, Urine <1.005 (*) 1.005 - 1.030   pH 7.0  5.0 - 8.0   Glucose, UA NEGATIVE  NEGATIVE mg/dL   Hgb urine dipstick NEGATIVE  NEGATIVE   Bilirubin Urine NEGATIVE  NEGATIVE   Ketones, ur NEGATIVE  NEGATIVE mg/dL   Protein, ur NEGATIVE  NEGATIVE mg/dL   Urobilinogen, UA 0.2  0.0 - 1.0 mg/dL   Nitrite NEGATIVE  NEGATIVE   Leukocytes, UA SMALL (*) NEGATIVE  URINE MICROSCOPIC-ADD ON     Status: Abnormal   Collection Time    01/20/14  7:25 PM      Result Value Ref Range   Squamous Epithelial / LPF FEW (*) RARE   WBC, UA 3-6  <3 WBC/hpf   RBC / HPF 0-2  <3 RBC/hpf   Bacteria, UA RARE  RARE  Assessment and Plan  Anemia and n/v in early pregnancy  IV hydration, u/s pending Care assumed by Dr. Rolla Plate 01/20/2014, 7:26 PM   Care assumed from [facility].  US obtained as above showing IUP with small subchorionic hemorrhage.Pt with severe orthostatics initially on presentation. Orthostasis resolved but dizziness and headaches persisted after 2L of LR bolus.  PT unable to sit up without becoming pre-syncopal.  Discussed with pt.  Will place in observation and transfuse 2U of RBCs for symptomatic anemia.  Plan for discharge after.    Caliph [place]ak, Eugenia Pancoast, MD

## 2014-01-20 NOTE — H&P (Signed)
Penny [name] is an 28 y.o. female.   28 y.o. G3P1011 at 54w6dwith known iron deficiency anemia who presents with worsening dizziness. Ongoing severe iron deficiency anemia d/t malabsorption s/p gastric bypass. Sent to MAU on 2/12 by Dr. EElonda Huskyd/t severe dizziness and fatigue, Hgb 7.9, received IV iron infusion at that time. States she has only felt worse since then. Having n/v, unable to keep much down, PO phenergan not helping. Has headaches with Zofran and severe anxiety reaction with reglan. Has only had about 4 oz of juice and a "little cheese and crackers" all day today. Having some menstrual type cramping but no vaginal bleeding.   In the MAU was noted to be severely orthostatic with HR increasing from 100 to 144 with standing and BP dropping from SBP of 120s to 97.  She was given 2L of LR with improvement in orthostasis but no improvement in symptoms. Even with raising the head of the bed, the pt became lightheaded with chest pain.    No fevers, chills,  diarrhea, constipation.    Some intermittent SOB with standing.      Menstrual History:  Patient's last menstrual period was 11/19/2013.    Past Medical History  Diagnosis Date  . Migraines   . Interstitial cystitis   . IUD FEB 2010  . Gastritis JULY 2011  . Depression   . 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 Fatty liver   . Obesity (BMI 30-39.9) 2011 228 LBS BMI 36.8  . Iron deficiency anemia 07/23/2010  . Irritable bowel syndrome 2012 DIARRHEA    JUN 2012 TTG IgA 14.9  . Elevated liver enzymes JUL 2011ALK PHOS 111-127 AST  143-267 ALT  213-321T BILI 0.6  ALB  3.7-4.06 Jun 2011 ALK PHOS 118 AST 24 ALT 42 T BILI 0.4 ALB 3.9  . Chronic daily headache   . Ovarian cyst   . PONV (postoperative nausea and vomiting)   . ADD (attention deficit disorder)   . RLQ abdominal pain 07/18/2013  . Menorrhagia 07/18/2013  . Anxiety   . Potassium (K) deficiency   . Dysmenorrhea 09/18/2013  . Stress 09/18/2013  . Patient desires  pregnancy 09/18/2013  . Pregnant 12/25/2013    Past Surgical History  Procedure Laterality Date  . Gab  2007    in [place]-POUCH 5 CM  . Cholecystectomy  2005    biliary dyskinesia  . Cath self every nite      for sodium bicarb injection (discontinued 2013)  . Colonoscopy  JUN 2012 ABD PN/DIARRHEA WITH PROPOFOL    NL COLON  . Upper gastrointestinal endoscopy  JULY 2011 NAUSEA-D125,V6, PH 25    Bx; GASTRITIS, POUCH-5 CM LONG  . Dilation and curettage of uterus    . Tonsillectomy    . Gastric bypass  06/2006  . Savory dilation  06/20/2012    SQBV:QXIHgastritis/Ulcer in the mid jejunum  . Tonsillectomy and adenoidectomy    . Esophagogastroduodenoscopy      Family History  Problem Relation Age of Onset  . Hemochromatosis Maternal Grandmother   . Migraines Maternal Grandmother   . Cancer Maternal Grandmother   . Hypertension Father   . Diabetes Father   . Coronary artery disease Father   . Migraines Paternal Grandmother   . Cancer Mother     breast  . Hemochromatosis Mother   . Coronary artery disease Paternal Grandfather     Social History:  reports that she  has quit smoking. Her smoking use included Cigarettes. She smoked 0.00 packs per day for 5 years. She has never used smokeless tobacco. She reports that she does not drink alcohol or use illicit drugs.  Allergies:  Allergies  Allergen Reactions  . Gabapentin Other (See Comments)    PATIENT WAS UNRESPONSIVE AFTER TAKING MEDICATION, BUT VITALS REMAINED STABLE  . Metoclopramide Hcl Anxiety    Patient felt like she was trapped in a box, and cant get out.  Temporary loss of movement, weakness, tingling.    . Topamax [Topiramate] Shortness Of Breath and Other (See Comments)    Adverse Side effects, changed taste for some time, numbness and tingling in extremities.    . Codeine Nausea And Vomiting  . Latex Rash  . Tape Itching and Rash    Adhesive   Prolonged skin rash and itching.      Prescriptions prior to  admission  Medication Sig Dispense Refill  . acetaminophen (TYLENOL) 500 MG tablet Take 1,000 mg by mouth every 6 (six) hours as needed for pain.      . Ascorbic Acid (VITAMIN C) 100 MG CHEW Chew 2 tablets by mouth daily.      . calcium carbonate (TUMS - DOSED IN MG ELEMENTAL CALCIUM) 500 MG chewable tablet Chew 3 tablets by mouth daily as needed for indigestion or heartburn.      . Cholecalciferol (VITAMIN D3) 2000 UNITS TABS Take 1 tablet by mouth daily.      . clobetasol ointment (TEMOVATE) 1.60 % Apply 1 application topically 2 (two) times daily. Apply to affected area  30 g  2  . clonazePAM (KLONOPIN) 0.5 MG tablet TAKE 1/2 to 1 tablet tablet bid prn  90 tablet  0  . Cyanocobalamin (VITAMIN B-12) 500 MCG SUBL Place 2 tablets under the tongue daily.      Marland Kitchen docusate sodium (COLACE) 100 MG capsule Take 100 mg by mouth 2 (two) times daily.      . ferrous fumarate (HEMOCYTE - 106 MG FE) 325 (106 FE) MG TABS tablet Take 1 tablet by mouth.      . hydrOXYzine (VISTARIL) 25 MG capsule Take 1 bid as needed  30 capsule  2  . OVER THE COUNTER MEDICATION Take 1 tablet by mouth daily. Potassium, magnesium, calcium combination.      . potassium chloride (K-DUR) 10 MEQ tablet Take 1 tablet (10 mEq total) by mouth daily.  30 tablet  11  . Prenatal Vit-Fe Fumarate-FA (PRENATAL MULTIVITAMIN) TABS Take 1 tablet by mouth at bedtime.       . promethazine (PHENERGAN) 25 MG tablet Take 1 tablet (25 mg total) by mouth every 6 (six) hours as needed for nausea.  22 tablet  3  . [DISCONTINUED] ibuprofen (ADVIL,MOTRIN) 200 MG tablet Take 600 mg by mouth every 6 (six) hours as needed for pain.        ROS See above    Blood pressure 120/73, pulse 80, temperature 98 F (36.7 C), temperature source Oral, resp. rate 18, height _0  (1.676 m), weight 88.451 kg (195 lb), last menstrual period 11/19/2013, SpO2 100.00%. Physical Exam  Constitutional: No distress.  Pale. Ill appearing   HENT:  Head: Normocephalic and  atraumatic.  Conjunctival pallor  Neck: Neck supple.  Cardiovascular: Regular rhythm and normal heart sounds.   Tachycardia with standing.    Respiratory: Effort normal and breath sounds normal.  GI: Soft. Bowel sounds are normal.  Musculoskeletal: Normal range of motion.  Neurological: She  is alert.  Skin: Skin is warm and dry. There is pallor.  Psychiatric: She has a normal mood and affect.    Results for orders placed during the hospital encounter of 01/20/14 (from the past 24 hour(s))  CBC     Status: Abnormal   Collection Time    01/20/14  6:28 PM      Result Value Ref Range   WBC 5.2  4.0 - 10.5 K/uL   RBC 3.64 (*) 3.87 - 5.11 MIL/uL   Hemoglobin 8.2 (*) 12.0 - 15.0 g/dL   HCT 26.0 (*) 36.0 - 46.0 %   MCV 71.4 (*) 78.0 - 100.0 fL   MCH 22.5 (*) 26.0 - 34.0 pg   MCHC 31.5  30.0 - 36.0 g/dL   RDW 19.2 (*) 11.5 - 15.5 %   Platelets 252  150 - 400 K/uL  COMPREHENSIVE METABOLIC PANEL     Status: Abnormal   Collection Time    01/20/14  6:28 PM      Result Value Ref Range   Sodium 140  137 - 147 mEq/L   Potassium 4.2  3.7 - 5.3 mEq/L   Chloride 102  96 - 112 mEq/L   CO2 23  19 - 32 mEq/L   Glucose, Bld 98  70 - 99 mg/dL   BUN 7  6 - 23 mg/dL   Creatinine, Ser 0.41 (*) 0.50 - 1.10 mg/dL   Calcium 9.9  8.4 - 10.5 mg/dL   Total Protein 6.8  6.0 - 8.3 g/dL   Albumin 3.9  3.5 - 5.2 g/dL   AST 21  0 - 37 U/L   ALT 22  0 - 35 U/L   Alkaline Phosphatase 55  39 - 117 U/L   Total Bilirubin 0.2 (*) 0.3 - 1.2 mg/dL   GFR calc non Af Amer >90  >90 mL/min   GFR calc Af Amer >90  >90 mL/min  TYPE AND SCREEN     Status: None   Collection Time    01/20/14  6:30 PM      Result Value Ref Range   ABO/RH(D) A POS     Antibody Screen NEG     Sample Expiration 01/23/2014    URINALYSIS, ROUTINE W REFLEX MICROSCOPIC     Status: Abnormal   Collection Time    01/20/14  7:25 PM      Result Value Ref Range   Color, Urine YELLOW  YELLOW   APPearance CLEAR  CLEAR   Specific Gravity,  Urine <1.005 (*) 1.005 - 1.030   pH 7.0  5.0 - 8.0   Glucose, UA NEGATIVE  NEGATIVE mg/dL   Hgb urine dipstick NEGATIVE  NEGATIVE   Bilirubin Urine NEGATIVE  NEGATIVE   Ketones, ur NEGATIVE  NEGATIVE mg/dL   Protein, ur NEGATIVE  NEGATIVE mg/dL   Urobilinogen, UA 0.2  0.0 - 1.0 mg/dL   Nitrite NEGATIVE  NEGATIVE   Leukocytes, UA SMALL (*) NEGATIVE  URINE MICROSCOPIC-ADD ON     Status: Abnormal   Collection Time    01/20/14  7:25 PM      Result Value Ref Range   Squamous Epithelial / LPF FEW (*) RARE   WBC, UA 3-6  <3 WBC/hpf   RBC / HPF 0-2  <3 RBC/hpf   Bacteria, UA RARE  RARE  PREPARE RBC (CROSSMATCH)     Status: None   Collection Time    01/20/14 11:00 PM      Result Value Ref Range  Order Confirmation ORDER PROCESSED BY BLOOD BANK      US Ob Comp Less 14 Wks  01/20/2014   CLINICAL DATA:  Pregnant patient with anemia.  EXAM: OBSTETRIC <14 WK ULTRASOUND  TECHNIQUE: Transabdominal ultrasound was performed for evaluation of the gestation as well as the maternal uterus and adnexal regions.  COMPARISON:  None.  FINDINGS: Intrauterine gestational sac: Visualized/normal in shape. Subchorionic hemorrhage measuring 2.6 x 1.6 x 2.4 cm is identified.  Yolk sac:  Visualized.  Embryo:  Visualized.  Cardiac Activity: Detected.  Heart Rate: 183 bpm  CRL:   2.03  mm   8 w 5 d                  [name] EDC: 08/27/2014  Maternal uterus/adnexae: The right ovary is not visualized. Left ovary appears normal.  IMPRESSION: Single living intrauterine pregnancy. Small subchorionic hemorrhage is identified. The study is otherwise unremarkable.   Electronically Signed   By: Inge Rise M.D.   On: 01/20/2014 21:01    Assessment/Plan:  28 yo G3P1011 @ 51w6dwho presented with symptomatic anemia.  Continued dizziness and chest pain now worsening over the last 3 days and unable to get out of bed.  Hgb 8.2 and s/p IV iron.   1) admit for transfusion overnight  - routine orders - transfuse 2U PRBCs.  Consent  obtained - plan to d/c after transfusion    - discussed with Dr. CDaphene Calamity Averill Winters L 01/20/2014, 11:52 PM

## 2014-01-20 NOTE — MAU Note (Signed)
Pt presents with complaints of feeling dizzy when she gets up to walk around. Pt has had iron injection on Thursday and says that hasn't helped.

## 2014-01-21 ENCOUNTER — Telehealth: Payer: Self-pay | Admitting: *Deleted

## 2014-01-21 ENCOUNTER — Encounter (HOSPITAL_COMMUNITY): Payer: Self-pay | Admitting: *Deleted

## 2014-01-21 DIAGNOSIS — O99019 Anemia complicating pregnancy, unspecified trimester: Secondary | ICD-10-CM

## 2014-01-21 LAB — ABO/RH: ABO/RH(D): A POS

## 2014-01-21 LAB — CBC
HCT: 28.1 % — ABNORMAL LOW (ref 36.0–46.0)
Hemoglobin: 9.2 g/dL — ABNORMAL LOW (ref 12.0–15.0)
MCH: 24.3 pg — ABNORMAL LOW (ref 26.0–34.0)
MCHC: 32.7 g/dL (ref 30.0–36.0)
MCV: 74.1 fL — ABNORMAL LOW (ref 78.0–100.0)
Platelets: 242 10*3/uL (ref 150–400)
RBC: 3.79 MIL/uL — AB (ref 3.87–5.11)
RDW: 19.7 % — ABNORMAL HIGH (ref 11.5–15.5)
WBC: 6.9 10*3/uL (ref 4.0–10.5)

## 2014-01-21 MED ORDER — PROMETHAZINE HCL 25 MG PO TABS
25.0000 mg | ORAL_TABLET | Freq: Four times a day (QID) | ORAL | Status: DC | PRN
  Administered 2014-01-21 (×2): 25 mg via ORAL
  Filled 2014-01-21 (×2): qty 1

## 2014-01-21 MED ORDER — ACETAMINOPHEN 325 MG PO TABS
650.0000 mg | ORAL_TABLET | ORAL | Status: DC | PRN
  Administered 2014-01-21: 650 mg via ORAL
  Filled 2014-01-21: qty 2

## 2014-01-21 NOTE — Progress Notes (Signed)
Pt discharged home with husband... Condition stable... No equipment... Taken to car via wheelchair by Janalyn Shy, RN

## 2014-01-21 NOTE — MAU Provider Note (Signed)
Attestation of Attending Supervision of Advanced Practitioner (CNM/NP): Evaluation and management procedures were performed by the Advanced Practitioner under my supervision and collaboration.  I have reviewed the Advanced Practitioner's note and chart, and I agree with the management and plan.  Romona Murdy 01/21/2014 7:52 AM

## 2014-01-21 NOTE — H&P (Signed)
Attestation of Attending Supervision of Advanced Practitioner (CNM/NP): Evaluation and management procedures were performed by the Advanced Practitioner under my supervision and collaboration.  I have reviewed the Advanced Practitioner's note and chart, and I agree with the management and plan.  Jenell Dobransky 01/21/2014 7:52 AM   

## 2014-01-21 NOTE — Telephone Encounter (Signed)
Pt was given an appointment this week to see a provider and follow up on ER visit.

## 2014-01-21 NOTE — Progress Notes (Signed)
Ur chart review completed.  

## 2014-01-22 ENCOUNTER — Other Ambulatory Visit: Payer: 59

## 2014-01-22 LAB — TYPE AND SCREEN
ABO/RH(D): A POS
Antibody Screen: NEGATIVE
Unit division: 0
Unit division: 0

## 2014-01-22 NOTE — Discharge Summary (Signed)
Attestation of Attending Supervision of Advanced Practitioner (CNM/NP): Evaluation and management procedures were performed by the Advanced Practitioner under my supervision and collaboration.  I have reviewed the Advanced Practitioner's note and chart, and I agree with the management and plan.  Gianna Calef 01/22/2014 7:55 AM

## 2014-01-22 NOTE — Discharge Summary (Signed)
Antenatal Physician Discharge Summary  Patient ID: Penny [name] MRN: [id number] DOB/AGE: 1986-10-03 28 y.o.  Admit date: 01/20/2014 Discharge date: 01/22/2014  Admission Diagnoses: symptomatic anemia  Discharge Diagnoses: same   Significant Diagnostic Studies:  Results for orders placed during the hospital encounter of 01/20/14 (from the past 168 hour(s))  CBC   Collection Time    01/20/14  6:28 PM      Result Value Ref Range   WBC 5.2  4.0 - 10.5 K/uL   RBC 3.64 (*) 3.87 - 5.11 MIL/uL   Hemoglobin 8.2 (*) 12.0 - 15.0 g/dL   HCT 26.0 (*) 36.0 - 46.0 %   MCV 71.4 (*) 78.0 - 100.0 fL   MCH 22.5 (*) 26.0 - 34.0 pg   MCHC 31.5  30.0 - 36.0 g/dL   RDW 19.2 (*) 11.5 - 15.5 %   Platelets 252  150 - 400 K/uL  COMPREHENSIVE METABOLIC PANEL   Collection Time    01/20/14  6:28 PM      Result Value Ref Range   Sodium 140  137 - 147 mEq/L   Potassium 4.2  3.7 - 5.3 mEq/L   Chloride 102  96 - 112 mEq/L   CO2 23  19 - 32 mEq/L   Glucose, Bld 98  70 - 99 mg/dL   BUN 7  6 - 23 mg/dL   Creatinine, Ser 0.41 (*) 0.50 - 1.10 mg/dL   Calcium 9.9  8.4 - 10.5 mg/dL   Total Protein 6.8  6.0 - 8.3 g/dL   Albumin 3.9  3.5 - 5.2 g/dL   AST 21  0 - 37 U/L   ALT 22  0 - 35 U/L   Alkaline Phosphatase 55  39 - 117 U/L   Total Bilirubin 0.2 (*) 0.3 - 1.2 mg/dL   GFR calc non Af Amer >90  >90 mL/min   GFR calc Af Amer >90  >90 mL/min  TYPE AND SCREEN   Collection Time    01/20/14  6:30 PM      Result Value Ref Range   ABO/RH(D) A POS     Antibody Screen NEG     Sample Expiration 01/23/2014     Unit Number [id number]     Blood Component Type RED CELLS,LR     Unit division 00     Status of Unit ISSUED     Transfusion Status OK TO TRANSFUSE     Crossmatch Result Compatible     Unit Number [id number]     Blood Component Type RED CELLS,LR     Unit division 00     Status of Unit ISSUED     Transfusion Status OK TO TRANSFUSE     Crossmatch Result Compatible    ABO/RH   Collection  Time    01/20/14  6:30 PM      Result Value Ref Range   ABO/RH(D) A POS    URINALYSIS, ROUTINE W REFLEX MICROSCOPIC   Collection Time    01/20/14  7:25 PM      Result Value Ref Range   Color, Urine YELLOW  YELLOW   APPearance CLEAR  CLEAR   Specific Gravity, Urine <1.005 (*) 1.005 - 1.030   pH 7.0  5.0 - 8.0   Glucose, UA NEGATIVE  NEGATIVE mg/dL   Hgb urine dipstick NEGATIVE  NEGATIVE   Bilirubin Urine NEGATIVE  NEGATIVE   Ketones, ur NEGATIVE  NEGATIVE mg/dL   Protein, ur NEGATIVE  NEGATIVE mg/dL   Urobilinogen,  UA 0.2  0.0 - 1.0 mg/dL   Nitrite NEGATIVE  NEGATIVE   Leukocytes, UA SMALL (*) NEGATIVE  URINE MICROSCOPIC-ADD ON   Collection Time    01/20/14  7:25 PM      Result Value Ref Range   Squamous Epithelial / LPF FEW (*) RARE   WBC, UA 3-6  <3 WBC/hpf   RBC / HPF 0-2  <3 RBC/hpf   Bacteria, UA RARE  RARE  PREPARE RBC (CROSSMATCH)   Collection Time    01/20/14 11:00 PM      Result Value Ref Range   Order Confirmation ORDER PROCESSED BY BLOOD BANK    CBC   Collection Time    01/21/14  7:01 AM      Result Value Ref Range   WBC 6.9  4.0 - 10.5 K/uL   RBC 3.79 (*) 3.87 - 5.11 MIL/uL   Hemoglobin 9.2 (*) 12.0 - 15.0 g/dL   HCT 28.1 (*) 36.0 - 46.0 %   MCV 74.1 (*) 78.0 - 100.0 fL   MCH 24.3 (*) 26.0 - 34.0 pg   MCHC 32.7  30.0 - 36.0 g/dL   RDW 19.7 (*) 11.5 - 15.5 %   Platelets 242  150 - 400 K/uL  Results for orders placed in visit on 01/17/14 (from the past 168 hour(s))  POCT URINALYSIS DIPSTICK   Collection Time    01/17/14  4:22 PM      Result Value Ref Range   Color, UA       Clarity, UA       Glucose, UA neg     Bilirubin, UA       Ketones, UA neg     Spec Grav, UA       Blood, UA neg     pH, UA       Protein, UA neg     Urobilinogen, UA       Nitrite, UA ne     Leukocytes, UA small (1+)    Results for orders placed during the hospital encounter of 01/16/14 (from the past 168 hour(s))  URINALYSIS, ROUTINE W REFLEX MICROSCOPIC   Collection Time     01/16/14  7:40 AM      Result Value Ref Range   Color, Urine YELLOW  YELLOW   APPearance CLEAR  CLEAR   Specific Gravity, Urine 1.020  1.005 - 1.030   pH 5.5  5.0 - 8.0   Glucose, UA NEGATIVE  NEGATIVE mg/dL   Hgb urine dipstick NEGATIVE  NEGATIVE   Bilirubin Urine NEGATIVE  NEGATIVE   Ketones, ur NEGATIVE  NEGATIVE mg/dL   Protein, ur NEGATIVE  NEGATIVE mg/dL   Urobilinogen, UA 0.2  0.0 - 1.0 mg/dL   Nitrite NEGATIVE  NEGATIVE   Leukocytes, UA TRACE (*) NEGATIVE  URINE MICROSCOPIC-ADD ON   Collection Time    01/16/14  7:40 AM      Result Value Ref Range   Squamous Epithelial / LPF RARE  RARE   WBC, UA 3-6  <3 WBC/hpf   RBC / HPF 0-2  <3 RBC/hpf   Urine-Other RARE YEAST    WET PREP, GENITAL   Collection Time    01/16/14  8:10 AM      Result Value Ref Range   Yeast Wet Prep HPF POC NONE SEEN  NONE SEEN   Trich, Wet Prep NONE SEEN  NONE SEEN   Clue Cells Wet Prep HPF POC NONE SEEN  NONE SEEN   WBC, Wet  Prep HPF POC MODERATE (*) NONE SEEN  GC/CHLAMYDIA PROBE AMP   Collection Time    01/16/14  8:10 AM      Result Value Ref Range   CT Probe RNA NEGATIVE  NEGATIVE   GC Probe RNA NEGATIVE  NEGATIVE  CBC   Collection Time    01/16/14  9:08 AM      Result Value Ref Range   WBC 5.7  4.0 - 10.5 K/uL   RBC 3.55 (*) 3.87 - 5.11 MIL/uL   Hemoglobin 7.9 (*) 12.0 - 15.0 g/dL   HCT 25.3 (*) 36.0 - 46.0 %   MCV 71.3 (*) 78.0 - 100.0 fL   MCH 22.3 (*) 26.0 - 34.0 pg   MCHC 31.2  30.0 - 36.0 g/dL   RDW 19.1 (*) 11.5 - 15.5 %   Platelets 247  150 - 400 K/uL  COMPREHENSIVE METABOLIC PANEL   Collection Time    01/16/14  9:08 AM      Result Value Ref Range   Sodium 136 (*) 137 - 147 mEq/L   Potassium 3.6 (*) 3.7 - 5.3 mEq/L   Chloride 101  96 - 112 mEq/L   CO2 23  19 - 32 mEq/L   Glucose, Bld 123 (*) 70 - 99 mg/dL   BUN 9  6 - 23 mg/dL   Creatinine, Ser 0.46 (*) 0.50 - 1.10 mg/dL   Calcium 9.5  8.4 - 10.5 mg/dL   Total Protein 7.2  6.0 - 8.3 g/dL   Albumin 4.1  3.5 - 5.2  g/dL   AST 30  0 - 37 U/L   ALT 29  0 - 35 U/L   Alkaline Phosphatase 52  39 - 117 U/L   Total Bilirubin 0.4  0.3 - 1.2 mg/dL   GFR calc non Af Amer >90  >90 mL/min   GFR calc Af Amer >90  >90 mL/min    Treatments: 2U of PRBCs  Hospital Course:  This is a 28 y.o. G3P1011 with IUP at [redacted]w[redacted]d admitted for symptomatic anemia not responsive to IV fluids and s/p IV Iron.  She was admitted overnight for transfusion.  Her initial hgb was 8.2 and she was transfused 2U. At discharge her hgb was 9.2. Her dizziness and chest pain had improved remarkably.  She was discharged to home in stable condition with f/u at [facility] and would recommend she cont to receive IV iron.       Discharge Exam: BP 108/68  Pulse 77  Temp(Src) 98.3 F (36.8 C) (Oral)  Resp 18  Ht 5\' 6"  (1.676 m)  Wt 88.451 kg (195 lb)  BMI 31.49 kg/m2  SpO2 98%  LMP 11/19/2013 General appearance: alert, cooperative, no distress and pale Resp: clear to auscultation bilaterally Cardio: regular rate and rhythm GI: soft, NT/ND.   Discharge Condition: fair  Disposition: 01-Home or Self Care   Future Appointments Provider Department Dept Phone   01/24/2014 3:45 PM Florian Buff, MD [facility] OB-GYN [phone]   01/31/2014 2:30 PM [facility] Ultrasound [facility] [phone]       Medication List    STOP taking these medications       ibuprofen 200 MG tablet  Commonly known as:  ADVIL,MOTRIN      TAKE these medications       acetaminophen 500 MG tablet  Commonly known as:  TYLENOL  Take 1,000 mg by mouth every 6 (six) hours as needed for pain.     calcium carbonate 500  MG chewable tablet  Commonly known as:  TUMS - dosed in mg elemental calcium  Chew 3 tablets by mouth daily as needed for indigestion or heartburn.     clobetasol ointment 0.05 %  Commonly known as:  TEMOVATE  Apply 1 application topically 2 (two) times daily. Apply to affected area     clonazePAM 0.5 MG tablet  Commonly known as:   KLONOPIN  TAKE 1/2 to 1 tablet tablet bid prn     docusate sodium 100 MG capsule  Commonly known as:  COLACE  Take 100 mg by mouth 2 (two) times daily.     ferrous fumarate 325 (106 FE) MG Tabs tablet  Commonly known as:  HEMOCYTE - 106 mg FE  Take 1 tablet by mouth.     hydrOXYzine 25 MG capsule  Commonly known as:  VISTARIL  Take 1 bid as needed     OVER THE COUNTER MEDICATION  Take 1 tablet by mouth daily. Potassium, magnesium, calcium combination.     potassium chloride 10 MEQ tablet  Commonly known as:  K-DUR  Take 1 tablet (10 mEq total) by mouth daily.     prenatal multivitamin Tabs tablet  Take 1 tablet by mouth at bedtime.     promethazine 25 MG tablet  Commonly known as:  PHENERGAN  Take 1 tablet (25 mg total) by mouth every 6 (six) hours as needed for nausea.     promethazine 25 MG suppository  Commonly known as:  PHENERGAN  Place 1 suppository (25 mg total) rectally every 6 (six) hours as needed for nausea or vomiting.     Vitamin B-12 500 MCG Subl  Place 2 tablets under the tongue daily.     Vitamin C 100 MG Chew  Chew 2 tablets by mouth daily.     Vitamin D3 2000 UNITS Tabs  Take 1 tablet by mouth daily.           Follow-up Information   Follow up with [facility]. Schedule an appointment as soon as possible for a visit in 1 week.   Specialty:  Obstetrics and Gynecology   Contact information:   [address] [place] [address] [phone]      Signed: Kassie Mends M.D. 01/22/2014, 3:14 AM

## 2014-01-23 ENCOUNTER — Telehealth: Payer: Self-pay | Admitting: Obstetrics & Gynecology

## 2014-01-23 NOTE — Telephone Encounter (Signed)
Pt states having same symptoms as she was having Sunday, heart rate drops when she stands up but is all right when she is resting. Pt states "just wanted Dr. Elonda Husky to know." Pt has an appt tomorrow with Dr. Elonda Husky.

## 2014-01-23 NOTE — Telephone Encounter (Signed)
Am aware

## 2014-01-24 ENCOUNTER — Encounter: Payer: Self-pay | Admitting: Obstetrics & Gynecology

## 2014-01-24 ENCOUNTER — Ambulatory Visit (INDEPENDENT_AMBULATORY_CARE_PROVIDER_SITE_OTHER): Payer: 59 | Admitting: Obstetrics & Gynecology

## 2014-01-24 VITALS — BP 100/70 | Wt 196.0 lb

## 2014-01-24 DIAGNOSIS — O99340 Other mental disorders complicating pregnancy, unspecified trimester: Secondary | ICD-10-CM

## 2014-01-24 DIAGNOSIS — Z1389 Encounter for screening for other disorder: Secondary | ICD-10-CM

## 2014-01-24 DIAGNOSIS — O99840 Bariatric surgery status complicating pregnancy, unspecified trimester: Secondary | ICD-10-CM

## 2014-01-24 DIAGNOSIS — Z331 Pregnant state, incidental: Secondary | ICD-10-CM

## 2014-01-24 DIAGNOSIS — O09299 Supervision of pregnancy with other poor reproductive or obstetric history, unspecified trimester: Secondary | ICD-10-CM

## 2014-01-24 DIAGNOSIS — O99019 Anemia complicating pregnancy, unspecified trimester: Secondary | ICD-10-CM

## 2014-01-24 LAB — POCT URINALYSIS DIPSTICK
Blood, UA: NEGATIVE
GLUCOSE UA: NEGATIVE
Ketones, UA: NEGATIVE
LEUKOCYTES UA: NEGATIVE
NITRITE UA: NEGATIVE
Protein, UA: NEGATIVE

## 2014-01-24 NOTE — Progress Notes (Signed)
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

## 2014-01-31 ENCOUNTER — Other Ambulatory Visit: Payer: 59

## 2014-02-01 ENCOUNTER — Encounter: Payer: Self-pay | Admitting: Adult Health

## 2014-02-01 ENCOUNTER — Ambulatory Visit (INDEPENDENT_AMBULATORY_CARE_PROVIDER_SITE_OTHER): Payer: 59 | Admitting: Adult Health

## 2014-02-01 ENCOUNTER — Telehealth: Payer: Self-pay | Admitting: Adult Health

## 2014-02-01 VITALS — BP 114/78 | Wt 200.5 lb

## 2014-02-01 DIAGNOSIS — Z1389 Encounter for screening for other disorder: Secondary | ICD-10-CM

## 2014-02-01 DIAGNOSIS — D509 Iron deficiency anemia, unspecified: Secondary | ICD-10-CM

## 2014-02-01 DIAGNOSIS — O99840 Bariatric surgery status complicating pregnancy, unspecified trimester: Secondary | ICD-10-CM

## 2014-02-01 DIAGNOSIS — O99340 Other mental disorders complicating pregnancy, unspecified trimester: Secondary | ICD-10-CM

## 2014-02-01 DIAGNOSIS — Z331 Pregnant state, incidental: Secondary | ICD-10-CM

## 2014-02-01 DIAGNOSIS — O99019 Anemia complicating pregnancy, unspecified trimester: Secondary | ICD-10-CM

## 2014-02-01 DIAGNOSIS — O09299 Supervision of pregnancy with other poor reproductive or obstetric history, unspecified trimester: Secondary | ICD-10-CM

## 2014-02-01 LAB — POCT URINALYSIS DIPSTICK
Blood, UA: NEGATIVE
GLUCOSE UA: NEGATIVE
Ketones, UA: NEGATIVE
Leukocytes, UA: NEGATIVE
NITRITE UA: NEGATIVE
Protein, UA: NEGATIVE

## 2014-02-01 LAB — POCT HEMOGLOBIN: HEMOGLOBIN: 10.2 g/dL — AB (ref 12.2–16.2)

## 2014-02-01 NOTE — Telephone Encounter (Signed)
Spoke with pt. Pt states she was supposed to have New OB yest but due to snow, we were closed. Not scheduled again until next Friday. Pt states she received 2 units of blood due to her anemia. Pt states she is not having any bleeding. I advised that's a good sign. Pt wants to talk to you about a note for work. Thanks!!

## 2014-02-01 NOTE — Telephone Encounter (Signed)
Pt to come in at 12:30,  Had to cancel appt yesterday due to weather

## 2014-02-01 NOTE — Patient Instructions (Signed)
Keep appt 3/6

## 2014-02-01 NOTE — Progress Notes (Signed)
Pt in for FHR check , appt 3/6 with Dr Elonda Husky, no bleeding, burns at times, will start DB?or new job description,to bring to visit next week for Dr Elonda Husky to review, she is sp 2uPRBC, fingerstich hgb 10.2

## 2014-02-04 ENCOUNTER — Telehealth: Payer: Self-pay | Admitting: *Deleted

## 2014-02-05 ENCOUNTER — Encounter: Payer: Self-pay | Admitting: Obstetrics & Gynecology

## 2014-02-05 ENCOUNTER — Telehealth: Payer: Self-pay

## 2014-02-08 ENCOUNTER — Other Ambulatory Visit: Payer: Self-pay | Admitting: Adult Health

## 2014-02-08 ENCOUNTER — Ambulatory Visit (INDEPENDENT_AMBULATORY_CARE_PROVIDER_SITE_OTHER): Payer: 59

## 2014-02-08 DIAGNOSIS — Z3490 Encounter for supervision of normal pregnancy, unspecified, unspecified trimester: Secondary | ICD-10-CM

## 2014-02-08 DIAGNOSIS — O43899 Other placental disorders, unspecified trimester: Secondary | ICD-10-CM

## 2014-02-08 DIAGNOSIS — O99019 Anemia complicating pregnancy, unspecified trimester: Secondary | ICD-10-CM

## 2014-02-08 DIAGNOSIS — O368990 Maternal care for other specified fetal problems, unspecified trimester, not applicable or unspecified: Secondary | ICD-10-CM

## 2014-02-08 NOTE — Progress Notes (Signed)
U/S(10+4wks)-single active fetus, CRL c/w dates, cx appears closed (3.2cm), bilateral adnexa wnl, FHR- 179 bpm, no Decker noted on today's exam

## 2014-02-11 ENCOUNTER — Telehealth: Payer: Self-pay | Admitting: Adult Health

## 2014-02-11 ENCOUNTER — Encounter: Payer: Self-pay | Admitting: Obstetrics & Gynecology

## 2014-02-11 MED ORDER — CLONAZEPAM 0.5 MG PO TABS: ORAL_TABLET | ORAL | Status: DC

## 2014-02-11 NOTE — Telephone Encounter (Signed)
Pt states still having episodes heart rates shooting up, body feels like jello and feels like she is going to pass out. Pt states has discuss symptoms with Dr. Elonda Husky. Pt requesting a note from Dr. Elonda Husky stating unable to work at this time due to 40 hours/ 5 days a week.   Pt states vistaril not working would like to go back on Klonopin. Will need a refill for the Klonopin.

## 2014-02-14 ENCOUNTER — Encounter: Payer: 59 | Admitting: Advanced Practice Midwife

## 2014-02-18 ENCOUNTER — Other Ambulatory Visit (HOSPITAL_COMMUNITY)
Admission: RE | Admit: 2014-02-18 | Discharge: 2014-02-18 | Disposition: A | Discharge status: Home / Self Care | Payer: 59 | Source: Ambulatory Visit | Attending: Advanced Practice Midwife | Admitting: Advanced Practice Midwife

## 2014-02-18 ENCOUNTER — Ambulatory Visit (INDEPENDENT_AMBULATORY_CARE_PROVIDER_SITE_OTHER): Payer: 59 | Admitting: Women's Health

## 2014-02-18 ENCOUNTER — Ambulatory Visit (HOSPITAL_COMMUNITY)
Admission: RE | Admit: 2014-02-18 | Discharge: 2014-02-18 | Disposition: A | Discharge status: Home / Self Care | Payer: 59 | Source: Ambulatory Visit | Attending: Obstetrics & Gynecology | Admitting: Obstetrics & Gynecology

## 2014-02-18 ENCOUNTER — Encounter: Payer: Self-pay | Admitting: Women's Health

## 2014-02-18 VITALS — BP 120/72 | Wt 205.0 lb

## 2014-02-18 DIAGNOSIS — O99340 Other mental disorders complicating pregnancy, unspecified trimester: Secondary | ICD-10-CM

## 2014-02-18 DIAGNOSIS — Z1389 Encounter for screening for other disorder: Secondary | ICD-10-CM

## 2014-02-18 DIAGNOSIS — Z01419 Encounter for gynecological examination (general) (routine) without abnormal findings: Secondary | ICD-10-CM | POA: Insufficient documentation

## 2014-02-18 DIAGNOSIS — R002 Palpitations: Secondary | ICD-10-CM

## 2014-02-18 DIAGNOSIS — O09299 Supervision of pregnancy with other poor reproductive or obstetric history, unspecified trimester: Secondary | ICD-10-CM

## 2014-02-18 DIAGNOSIS — O99019 Anemia complicating pregnancy, unspecified trimester: Secondary | ICD-10-CM

## 2014-02-18 DIAGNOSIS — F418 Other specified anxiety disorders: Secondary | ICD-10-CM

## 2014-02-18 DIAGNOSIS — O09899 Supervision of other high risk pregnancies, unspecified trimester: Secondary | ICD-10-CM | POA: Insufficient documentation

## 2014-02-18 DIAGNOSIS — Z8759 Personal history of other complications of pregnancy, childbirth and the puerperium: Secondary | ICD-10-CM | POA: Insufficient documentation

## 2014-02-18 DIAGNOSIS — D509 Iron deficiency anemia, unspecified: Secondary | ICD-10-CM

## 2014-02-18 DIAGNOSIS — O0990 Supervision of high risk pregnancy, unspecified, unspecified trimester: Secondary | ICD-10-CM | POA: Insufficient documentation

## 2014-02-18 DIAGNOSIS — O99840 Bariatric surgery status complicating pregnancy, unspecified trimester: Secondary | ICD-10-CM

## 2014-02-18 DIAGNOSIS — Z331 Pregnant state, incidental: Secondary | ICD-10-CM

## 2014-02-18 DIAGNOSIS — Z113 Encounter for screening for infections with a predominantly sexual mode of transmission: Secondary | ICD-10-CM | POA: Insufficient documentation

## 2014-02-18 DIAGNOSIS — I498 Other specified cardiac arrhythmias: Secondary | ICD-10-CM | POA: Insufficient documentation

## 2014-02-18 LAB — POCT URINALYSIS DIPSTICK
Blood, UA: NEGATIVE
Glucose, UA: NEGATIVE
KETONES UA: NEGATIVE
Leukocytes, UA: NEGATIVE
NITRITE UA: NEGATIVE
PROTEIN UA: NEGATIVE

## 2014-02-18 NOTE — Progress Notes (Signed)
Subjective:  Penny [name] is a 28 y.o. G3P1011 Caucasian female at [redacted]w[redacted]d by 5wk u/s being seen today for her first obstetrical visit.  Her obstetrical history is significant for 39wk SVD after IOL d/t PIH, had extensive 3rd degree lac, chronic IDA that required ~ 6 Fe infusions during pregnancy. H/O mini gastric bypass in 2007, lost 125lbs. Smoked 1/2ppd prior to this pregnancy, none since +PT.  Pregnancy history fully reviewed. She has already received 1 iv fe transfusion and 2u PRBCs this pregnancy at 8wks. States she is not on fe supplement d/t malabsorption/wouldn't benefit from supplement.   Patient reports continued random episodes of heart racing/sob. Doesn't feel that they are panic attacks. Pt had disussed previously w/ LHE, who recommended Holter if didn't resolve.  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 Has cut back on caffeine, was drinking 3-4 cherry cokes daily, now down to 1-2. Chronic vulvar irritation pt reports from IC- has tried clobetasol cream w/o much relief. Denies vb, cramping, uti s/s, abnormal/malodorous vag d/c.  BP 120/72  Wt 205 lb (92.987 kg)  LMP 11/19/2013  HISTORY: OB History  Gravida Para Term Preterm AB SAB TAB Ectopic Multiple Living  3 1 1  1 1    1    # Outcome Date GA Lbr Len/2nd Weight Sex Delivery Anes PTL Lv  3 CUR           2 TRM           1 SAB              Past Medical History  Diagnosis Date  . Migraines   . Interstitial cystitis   . IUD FEB 2010  . Gastritis JULY 2011  . Depression   . 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 Fatty liver   . Obesity (BMI 30-39.9) 2011 228 LBS BMI 36.8  . Iron deficiency anemia 07/23/2010  . Irritable bowel syndrome 2012 DIARRHEA    JUN 2012 TTG IgA 14.9  . Elevated liver enzymes JUL 2011ALK PHOS 111-127 AST  143-267 ALT  213-321T BILI 0.6  ALB  3.7-4.06 Jun 2011 ALK PHOS 118 AST 24 ALT 42 T BILI 0.4 ALB 3.9  . Chronic daily headache   . Ovarian cyst   . PONV  (postoperative nausea and vomiting)   . ADD (attention deficit disorder)   . RLQ abdominal pain 07/18/2013  . Menorrhagia 07/18/2013  . Anxiety   . Potassium (K) deficiency   . Dysmenorrhea 09/18/2013  . Stress 09/18/2013  . Patient desires pregnancy 09/18/2013  . Pregnant 12/25/2013   Past Surgical History  Procedure Laterality Date  . Gab  2007    in [place]-POUCH 5 CM  . Cholecystectomy  2005    biliary dyskinesia  . Cath self every nite      for sodium bicarb injection (discontinued 2013)  . Colonoscopy  JUN 2012 ABD PN/DIARRHEA WITH PROPOFOL    NL COLON  . Upper gastrointestinal endoscopy  JULY 2011 NAUSEA-D125,V6, PH 25    Bx; GASTRITIS, POUCH-5 CM LONG  . Dilation and curettage of uterus    . Tonsillectomy    . Gastric bypass  06/2006  . Savory dilation  06/20/2012    SLF:Mild gastritis/Ulcer in the mid jejunum  . Tonsillectomy and adenoidectomy    . Esophagogastroduodenoscopy     Family History  Problem Relation Age of Onset  .   Hemochromatosis Maternal Grandmother   . Migraines Maternal Grandmother   . Cancer Maternal Grandmother   . Hypertension Father   . Diabetes Father   . Coronary artery disease Father   . Migraines Paternal Grandmother   . Cancer Mother     breast  . Hemochromatosis Mother   . Coronary artery disease Paternal Grandfather     Exam   System:     General: Well developed & nourished, no acute distress   Skin: Warm & dry, normal coloration and turgor, no rashes   Neurologic: Alert & oriented, normal mood   Cardiovascular: Regular rate & rhythm   Respiratory: Effort & rate normal, LCTAB, acyanotic   Abdomen: Soft, non tender   Extremities: normal strength, tone   Pelvic Exam:    Perineum: Normal perineum   Vulva: Excoriated, erythematous, co-exam w/ LHE   Vagina:  Erythematous, normal discharge   Cervix: Normal, bulbous, appears closed   Uterus: Normal size/shape/contour for GA  Vulva and vagina painted w/ gentian violet per  LHE  Thin prep pap smear obtained w/ reflex high risk HPV cotesting FHR: 166 via doppler   Assessment:   Pregnancy: G3P1011 Patient Active Problem List   Diagnosis Date Noted  . Supervision of high-risk pregnancy 02/18/2014    Priority: High  . Rapid palpitations 02/18/2014    Priority: High  . Iron deficiency anemia of pregnancy 01/16/2014    Priority: High  . Chronic interstitial cystitis 10/23/2013    Priority: High  . Opiate addiction 03/11/2012    Priority: High    Class: Acute  . Depression with anxiety 03/10/2012    Priority: High    Class: Chronic  . Symptomatic anemia 01/20/2014  . Restless legs 01/16/2014  . Dysmenorrhea 09/18/2013  . Stress 09/18/2013  . Patient desires pregnancy 09/18/2013  . RLQ abdominal pain 07/18/2013  . Menorrhagia 07/18/2013  . Epigastric pain 06/06/2012  . Vomiting 06/06/2012  . Passive suicidal ideations 03/10/2012    Class: Acute  . History of migraine headaches 03/10/2012    Class: Acute  . 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 Panic disorder without agoraphobia 03/10/2012  . IBS (irritable bowel syndrome) 08/25/2011  . Diarrhea 05/27/2011  . OBESITY, UNSPECIFIED 09/17/2010  . LIVER FUNCTION TESTS, ABNORMAL, HX OF 09/17/2010  . 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 Depression/anxiety- on klonopin    Plan:  Initial labs drawn Continue prenatal vitamins Problem list reviewed and updated Reviewed n/v relief measures and warning s/s to report Reviewed recommended weight gain based on pre-gravid BMI Encouraged well-balanced diet Genetic Screening  discussed Integrated Screen: requested Cystic fibrosis screening discussed requested Ultrasound discussed; fetal survey: requested Follow up in 1 weeks for 1st it/nt, then 4wks for 2nd IT [place]  BTawnya CrookCNM, WPmg [facility]3/16/2015 12:26 PM

## 2014-02-18 NOTE — Patient Instructions (Signed)
Pregnancy - First Trimester  During sexual intercourse, millions of sperm go into the vagina. Only 1 sperm will penetrate and fertilize the female egg while it is in the Fallopian tube. One week later, the fertilized egg implants into the wall of the uterus. An embryo begins to develop into a baby. At 6 to 8 weeks, the eyes and face are formed and the heartbeat can be seen on ultrasound. At the end of 12 weeks (first trimester), all the baby's organs are formed. Now that you are pregnant, you will want to do everything you can to have a healthy baby. Two of the most important things are to get good prenatal care and follow your caregiver's instructions. Prenatal care is all the medical care you receive before the baby's birth. It is given to prevent, find, and treat problems during the pregnancy and childbirth.  PRENATAL EXAMS  · During prenatal visits, your weight, blood pressure, and urine are checked. This is done to make sure you are healthy and progressing normally during the pregnancy.  · A pregnant woman should gain 25 to 35 pounds during the pregnancy. However, if you are overweight or underweight, your caregiver will advise you regarding your weight.  · Your caregiver will ask and answer questions for you.  · Blood work, cervical cultures, other necessary tests, and a Pap test are done during your prenatal exams. These tests are done to check on your health and the probable health of your baby. Tests are strongly recommended and done for HIV with your permission. This is the virus that causes AIDS. These tests are done because medicines can be given to help prevent your baby from being born with this infection should you have been infected without knowing it. Blood work is also used to find out your blood type, previous infections, and follow your blood levels (hemoglobin).  · Low hemoglobin (anemia) is common during pregnancy. Iron and vitamins are given to help prevent this. Later in the pregnancy, blood  tests for diabetes will be done along with any other tests if any problems develop.  · You may need other tests to make sure you and the baby are doing well.  CHANGES DURING THE FIRST TRIMESTER   Your body goes through many changes during pregnancy. They vary from person to person. Talk to your caregiver about changes you notice and are concerned about. Changes can include:  · Your menstrual period stops.  · The egg and sperm carry the genes that determine what you look like. Genes from you and your partner are forming a baby. The female genes determine whether the baby is a boy or a girl.  · Your body increases in girth and you may feel bloated.  · Feeling sick to your stomach (nauseous) and throwing up (vomiting). If the vomiting is uncontrollable, call your caregiver.  · Your breasts will begin to enlarge and become tender.  · Your nipples may stick out more and become darker.  · The need to urinate more. Painful urination may mean you have a bladder infection.  · Tiring easily.  · Loss of appetite.  · Cravings for certain kinds of food.  · At first, you may gain or lose a couple of pounds.  · You may have changes in your emotions from day to day (excited to be pregnant or concerned something may go wrong with the pregnancy and baby).  · You may have more vivid and strange dreams.  HOME CARE INSTRUCTIONS   ·   It is very important to avoid all smoking, alcohol and non-prescribed drugs during your pregnancy. These affect the formation and growth of the baby. Avoid chemicals while pregnant to ensure the delivery of a healthy infant.  · Start your prenatal visits by the 12th week of pregnancy. They are usually scheduled monthly at first, then more often in the last 2 months before delivery. Keep your caregiver's appointments. Follow your caregiver's instructions regarding medicine use, blood and lab tests, exercise, and diet.  · During pregnancy, you are providing food for you and your baby. Eat regular, well-balanced  meals. Choose foods such as meat, fish, milk and other low fat dairy products, vegetables, fruits, and whole-grain breads and cereals. Your caregiver will tell you of the ideal weight gain.  · You can help morning sickness by keeping soda crackers at the bedside. Eat a couple before arising in the morning. You may want to use the crackers without salt on them.  · Eating 4 to 5 small meals rather than 3 large meals a day also may help the nausea and vomiting.  · Drinking liquids between meals instead of during meals also seems to help nausea and vomiting.  · A physical sexual relationship may be continued throughout pregnancy if there are no other problems. Problems may be early (premature) leaking of amniotic fluid from the membranes, vaginal bleeding, or belly (abdominal) pain.  · Exercise regularly if there are no restrictions. Check with your caregiver or physical therapist if you are unsure of the safety of some of your exercises. Greater weight gain will occur in the last 2 trimesters of pregnancy. Exercising will help:  · Control your weight.  · Keep you in shape.  · Prepare you for labor and delivery.  · Help you lose your pregnancy weight after you deliver your baby.  · Wear a good support or jogging bra for breast tenderness during pregnancy. This may help if worn during sleep too.  · Ask when prenatal classes are available. Begin classes when they are offered.  · Do not use hot tubs, steam rooms, or saunas.  · Wear your seat belt when driving. This protects you and your baby if you are in an accident.  · Avoid raw meat, uncooked cheese, cat litter boxes, and soil used by cats throughout the pregnancy. These carry germs that can cause birth defects in the baby.  · The first trimester is a good time to visit your dentist for your dental health. Getting your teeth cleaned is okay. Use a softer toothbrush and brush gently during pregnancy.  · Ask for help if you have financial, counseling, or nutritional needs  during pregnancy. Your caregiver will be able to offer counseling for these needs as well as refer you for other special needs.  · Do not take any medicines or herbs unless told by your caregiver.  · Inform your caregiver if there is any mental or physical domestic violence.  · Make a list of emergency phone numbers of family, friends, hospital, and police and fire departments.  · Write down your questions. Take them to your prenatal visit.  · Do not douche.  · Do not cross your legs.  · If you have to stand for long periods of time, rotate you feet or take small steps in a circle.  · You may have more vaginal secretions that may require a sanitary pad. Do not use tampons or scented sanitary pads.  MEDICINES AND DRUG USE IN PREGNANCY  ·   Take prenatal vitamins as directed. The vitamin should contain 1 milligram of folic acid. Keep all vitamins out of reach of children. Only a couple vitamins or tablets containing iron may be fatal to a baby or young child when ingested.  · Avoid use of all medicines, including herbs, over-the-counter medicines, not prescribed or suggested by your caregiver. Only take over-the-counter or prescription medicines for pain, discomfort, or fever as directed by your caregiver. Do not use aspirin, ibuprofen, or naproxen unless directed by your caregiver.  · Let your caregiver also know about herbs you may be using.  · Alcohol is related to a number of birth defects. This includes fetal alcohol syndrome. All alcohol, in any form, should be avoided completely. Smoking will cause low birth rate and premature babies.  · Street or illegal drugs are very harmful to the baby. They are absolutely forbidden. A baby born to an addicted mother will be addicted at birth. The baby will go through the same withdrawal an adult does.  · Let your caregiver know about any medicines that you have to take and for what reason you take them.  SEEK MEDICAL CARE IF:   You have any concerns or worries during your  pregnancy. It is better to call with your questions if you feel they cannot wait, rather than worry about them.  SEEK IMMEDIATE MEDICAL CARE IF:   · An unexplained oral temperature above 102° F (38.9° C) develops, or as your caregiver suggests.  · You have leaking of fluid from the vagina (birth canal). If leaking membranes are suspected, take your temperature and inform your caregiver of this when you call.  · There is vaginal spotting or bleeding. Notify your caregiver of the amount and how many pads are used.  · You develop a bad smelling vaginal discharge with a change in the color.  · You continue to feel sick to your stomach (nauseated) and have no relief from remedies suggested. You vomit blood or coffee ground-like materials.  · You lose more than 2 pounds of weight in 1 week.  · You gain more than 2 pounds of weight in 1 week and you notice swelling of your face, hands, feet, or legs.  · You gain 5 pounds or more in 1 week (even if you do not have swelling of your hands, face, legs, or feet).  · You get exposed to German measles and have never had them.  · You are exposed to fifth disease or chickenpox.  · You develop belly (abdominal) pain. Round ligament discomfort is a common non-cancerous (benign) cause of abdominal pain in pregnancy. Your caregiver still must evaluate this.  · You develop headache, fever, diarrhea, pain with urination, or shortness of breath.  · You fall or are in a car accident or have any kind of trauma.  · There is mental or physical violence in your home.  Document Released: 11/16/2001 Document Revised: 08/16/2012 Document Reviewed: 05/20/2009  [employer]® Patient Information ©2014 [employer].

## 2014-02-18 NOTE — Progress Notes (Signed)
48 hour Holter Monitor in progress. 

## 2014-02-19 ENCOUNTER — Encounter: Payer: Self-pay | Admitting: Women's Health

## 2014-02-19 LAB — CBC
HCT: 36 % (ref 36.0–46.0)
HEMOGLOBIN: 11.6 g/dL — AB (ref 12.0–15.0)
MCH: 26.9 pg (ref 26.0–34.0)
MCHC: 32.2 g/dL (ref 30.0–36.0)
MCV: 83.3 fL (ref 78.0–100.0)
PLATELETS: 237 10*3/uL (ref 150–400)
RBC: 4.32 MIL/uL (ref 3.87–5.11)
RDW: 24.9 % — ABNORMAL HIGH (ref 11.5–15.5)
WBC: 5.4 10*3/uL (ref 4.0–10.5)

## 2014-02-19 LAB — URINALYSIS
Bilirubin Urine: NEGATIVE
Glucose, UA: NEGATIVE mg/dL
HGB URINE DIPSTICK: NEGATIVE
KETONES UR: NEGATIVE mg/dL
Leukocytes, UA: NEGATIVE
NITRITE: NEGATIVE
PH: 8 (ref 5.0–8.0)
Protein, ur: NEGATIVE mg/dL
Specific Gravity, Urine: 1.019 (ref 1.005–1.030)
Urobilinogen, UA: 1 mg/dL (ref 0.0–1.0)

## 2014-02-19 LAB — URINE CULTURE
Colony Count: NO GROWTH
Organism ID, Bacteria: NO GROWTH

## 2014-02-19 LAB — DRUG SCREEN, URINE, NO CONFIRMATION
Amphetamine Screen, Ur: NEGATIVE
BENZODIAZEPINES.: NEGATIVE
Barbiturate Quant, Ur: NEGATIVE
COCAINE METABOLITES: NEGATIVE
CREATININE, U: 142.4 mg/dL
Marijuana Metabolite: NEGATIVE
Methadone: NEGATIVE
Opiate Screen, Urine: NEGATIVE
PHENCYCLIDINE (PCP): NEGATIVE
Propoxyphene: NEGATIVE

## 2014-02-19 LAB — HEPATITIS B SURFACE ANTIGEN: Hepatitis B Surface Ag: NEGATIVE

## 2014-02-19 LAB — ANTIBODY SCREEN: ANTIBODY SCREEN: NEGATIVE

## 2014-02-19 LAB — RUBELLA SCREEN: RUBELLA: 2.88 {index} — AB (ref <0.90)

## 2014-02-19 LAB — HIV ANTIBODY (ROUTINE TESTING W REFLEX): HIV: NONREACTIVE

## 2014-02-19 LAB — OXYCODONE SCREEN, UA, RFLX CONFIRM: OXYCODONE SCRN UR: NEGATIVE ng/mL

## 2014-02-19 LAB — RPR

## 2014-02-19 LAB — VARICELLA ZOSTER ANTIBODY, IGG: VARICELLA IGG: 702.5 {index} — AB (ref <135.00)

## 2014-02-20 ENCOUNTER — Telehealth: Payer: Self-pay | Admitting: Obstetrics & Gynecology

## 2014-02-20 LAB — CYSTIC FIBROSIS DIAGNOSTIC STUDY

## 2014-02-20 NOTE — Telephone Encounter (Signed)
Pt states wants to keep the monitor on longer due to not having a episode. Pt encouraged to call the [facility] at [facility] to discuss with them. Pt verbalized understanding.

## 2014-02-22 ENCOUNTER — Other Ambulatory Visit: Payer: Self-pay | Admitting: Women's Health

## 2014-02-22 DIAGNOSIS — Z36 Encounter for antenatal screening of mother: Secondary | ICD-10-CM

## 2014-02-25 ENCOUNTER — Encounter: Payer: Self-pay | Admitting: Women's Health

## 2014-02-25 ENCOUNTER — Telehealth: Payer: Self-pay | Admitting: Obstetrics & Gynecology

## 2014-02-25 NOTE — Telephone Encounter (Signed)
Dr. Elonda Husky, this patient would like for you to call her, she states she is  having a bad flair up of her IC for the past two weeks but got a lot worse last night and is now in bed unable to get up due to pain.  She states she has tried AZO, drinking lots of water, heating pad, ice and rest all with no relief.  She states she is having back spasms and bad pain with urination.  I offered to try to get her worked in to be seen this morning but she states she doesn't have insurance any more and she is not sure there is anything you could do if she came in.  Please advise

## 2014-02-26 ENCOUNTER — Other Ambulatory Visit: Payer: Self-pay | Admitting: Women's Health

## 2014-02-26 ENCOUNTER — Telehealth: Payer: Self-pay | Admitting: Women's Health

## 2014-02-26 DIAGNOSIS — R002 Palpitations: Secondary | ICD-10-CM

## 2014-02-26 NOTE — Telephone Encounter (Signed)
Called and recommended zantac(H2 blocker) due to allergy season and or OTC claritin

## 2014-02-26 NOTE — Telephone Encounter (Signed)
Notified pt of 48hr Holter results, appt w/ [facility] on 3/30 @ 1:30pm.  Roma Schanz, CNM, WHNP-BC 02/26/2014 1:26 PM

## 2014-02-27 ENCOUNTER — Emergency Department (HOSPITAL_COMMUNITY)
Admission: EM | Admit: 2014-02-27 | Discharge: 2014-02-28 | Disposition: A | Discharge status: Home / Self Care | Payer: 59 | Attending: Emergency Medicine | Admitting: Emergency Medicine

## 2014-02-27 ENCOUNTER — Other Ambulatory Visit: Payer: 59

## 2014-02-27 ENCOUNTER — Encounter (HOSPITAL_COMMUNITY): Payer: Self-pay | Admitting: Emergency Medicine

## 2014-02-27 DIAGNOSIS — O99019 Anemia complicating pregnancy, unspecified trimester: Secondary | ICD-10-CM | POA: Insufficient documentation

## 2014-02-27 DIAGNOSIS — G43909 Migraine, unspecified, not intractable, without status migrainosus: Secondary | ICD-10-CM | POA: Diagnosis not present

## 2014-02-27 DIAGNOSIS — Z9884 Bariatric surgery status: Secondary | ICD-10-CM | POA: Insufficient documentation

## 2014-02-27 DIAGNOSIS — R072 Precordial pain: Secondary | ICD-10-CM | POA: Insufficient documentation

## 2014-02-27 DIAGNOSIS — Z87448 Personal history of other diseases of urinary system: Secondary | ICD-10-CM | POA: Diagnosis not present

## 2014-02-27 DIAGNOSIS — F411 Generalized anxiety disorder: Secondary | ICD-10-CM | POA: Diagnosis not present

## 2014-02-27 DIAGNOSIS — Z91040 Latex allergy status: Secondary | ICD-10-CM | POA: Diagnosis not present

## 2014-02-27 DIAGNOSIS — F329 Major depressive disorder, single episode, unspecified: Secondary | ICD-10-CM | POA: Diagnosis not present

## 2014-02-27 DIAGNOSIS — G8929 Other chronic pain: Secondary | ICD-10-CM | POA: Insufficient documentation

## 2014-02-27 DIAGNOSIS — Z79899 Other long term (current) drug therapy: Secondary | ICD-10-CM | POA: Insufficient documentation

## 2014-02-27 DIAGNOSIS — D649 Anemia, unspecified: Secondary | ICD-10-CM | POA: Insufficient documentation

## 2014-02-27 DIAGNOSIS — O9989 Other specified diseases and conditions complicating pregnancy, childbirth and the puerperium: Secondary | ICD-10-CM | POA: Insufficient documentation

## 2014-02-27 DIAGNOSIS — Z87891 Personal history of nicotine dependence: Secondary | ICD-10-CM | POA: Insufficient documentation

## 2014-02-27 DIAGNOSIS — D509 Iron deficiency anemia, unspecified: Secondary | ICD-10-CM | POA: Diagnosis not present

## 2014-02-27 DIAGNOSIS — Z8719 Personal history of other diseases of the digestive system: Secondary | ICD-10-CM | POA: Diagnosis not present

## 2014-02-27 DIAGNOSIS — O99340 Other mental disorders complicating pregnancy, unspecified trimester: Secondary | ICD-10-CM | POA: Diagnosis not present

## 2014-02-27 DIAGNOSIS — F3289 Other specified depressive episodes: Secondary | ICD-10-CM | POA: Insufficient documentation

## 2014-02-27 DIAGNOSIS — I498 Other specified cardiac arrhythmias: Secondary | ICD-10-CM | POA: Diagnosis not present

## 2014-02-27 DIAGNOSIS — E876 Hypokalemia: Secondary | ICD-10-CM | POA: Diagnosis not present

## 2014-02-27 DIAGNOSIS — R079 Chest pain, unspecified: Secondary | ICD-10-CM

## 2014-02-27 LAB — URINALYSIS, ROUTINE W REFLEX MICROSCOPIC
Bilirubin Urine: NEGATIVE
Glucose, UA: NEGATIVE mg/dL
Hgb urine dipstick: NEGATIVE
Ketones, ur: NEGATIVE mg/dL
LEUKOCYTES UA: NEGATIVE
NITRITE: NEGATIVE
PROTEIN: NEGATIVE mg/dL
Specific Gravity, Urine: 1.02 (ref 1.005–1.030)
UROBILINOGEN UA: 0.2 mg/dL (ref 0.0–1.0)
pH: 6 (ref 5.0–8.0)

## 2014-02-27 NOTE — ED Notes (Signed)
Pt placed on cardiac monitor. VSS. Pt in SR at present.

## 2014-02-27 NOTE — ED Provider Notes (Signed)
CSN: [id number]     Arrival date & time 02/27/14  2149 History  This chart was scribed for Penny Nielsen, MD by Bennett Scrape, ED Scribe. This patient was seen in room APA10/APA10 and the patient's care was started at 11:37 PM.   Chief Complaint  Patient presents with  . Chest Pain     Patient is a 28 y.o. female presenting with chest pain. The history is provided by the patient. No language interpreter was used.  Chest Pain Pain location:  Substernal area Pain quality: sharp   Duration:  1 day Timing:  Intermittent Progression:  Waxing and waning Associated symptoms: fatigue and shortness of breath   Associated symptoms: no palpitations, no syncope and not vomiting     HPI Comments: Penny [name] is a 28 y.o. female who is [redacted] weeks pregnant presents to the Emergency Department complaining of CP felt intermittently today with associated fatigue and SOB. The episodes last 5 to 10 minutes with occasional sharp increases in pain. She has tried Vistaril, Tylenol and Klonopin without improvement. She was placed on a Holter cardiac monitor one week ago for tachycardiac and lightheadedness. She reports that it noted several periods of SVT with PVCs (10 of both noted in 48 hours period, runs weren't noted) which she believes was read by her OB. She denies any similar episodes since wearing the monitor. She has a follow up appointment with [facility] next week. She reports some CP in the ED and states that the pain is improved. Husband, EMT, denies seeing any SVT or PVC episodes on pt's cardiac monitor. She reports a family h/o SVT which required a pacemaker. She denies any emesis. She is G3P1A1. She states that she had a blood transfusion 6 weeks ago after being diagnosed with anemia. Her Hgb was running in 6s and 7s but was noted to be 10.4 after the blood transfusion. She had a gastric bypass and after first full term pregnancy and became anemic. She never increased her Hgb levels to normal levels  before this pregnancy. She is on her prenatal vitamins.   She also reports mild "baldder" discomfort and is currently on AZO but denies any vaginal bleeding or cramping. She denies being on antibiotics.   OB-GYN is [facility] with [facility]. She is not followed at a high-risk clinic.   Past Medical History  Diagnosis Date  . Migraines   . Interstitial cystitis   . IUD FEB 2010  . Gastritis JULY 2011  . Depression   . 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 Fatty liver   . Obesity (BMI 30-39.9) 2011 228 LBS BMI 36.8  . Iron deficiency anemia 07/23/2010  . Irritable bowel syndrome 2012 DIARRHEA    JUN 2012 TTG IgA 14.9  . Elevated liver enzymes JUL 2011ALK PHOS 111-127 AST  143-267 ALT  213-321T BILI 0.6  ALB  3.7-4.06 Jun 2011 ALK PHOS 118 AST 24 ALT 42 T BILI 0.4 ALB 3.9  . Chronic daily headache   . Ovarian cyst   . PONV (postoperative nausea and vomiting)   . ADD (attention deficit disorder)   . RLQ abdominal pain 07/18/2013  . Menorrhagia 07/18/2013  . Anxiety   . Potassium (K) deficiency   . Dysmenorrhea 09/18/2013  . Stress 09/18/2013  . Patient desires pregnancy 09/18/2013  . Pregnant 12/25/2013   Past Surgical History  Procedure Laterality Date  . Gab  2007    in [place]-POUCH 5  CM  . Cholecystectomy  2005    biliary dyskinesia  . Cath self every nite      for sodium bicarb injection (discontinued 2013)  . Colonoscopy  JUN 2012 ABD PN/DIARRHEA WITH PROPOFOL    NL COLON  . Upper gastrointestinal endoscopy  JULY 2011 NAUSEA-D125,V6, PH 25    Bx; GASTRITIS, POUCH-5 CM LONG  . Dilation and curettage of uterus    . Tonsillectomy    . Gastric bypass  06/2006  . Savory dilation  06/20/2012    JOA:CZYS gastritis/Ulcer in the mid jejunum  . Tonsillectomy and adenoidectomy    . Esophagogastroduodenoscopy     Family History  Problem Relation Age of Onset  . Hemochromatosis Maternal Grandmother   . Migraines Maternal Grandmother   . Cancer Maternal  Grandmother   . Hypertension Father   . Diabetes Father   . Coronary artery disease Father   . Migraines Paternal Grandmother   . Cancer Mother     breast  . Hemochromatosis Mother   . Coronary artery disease Paternal Grandfather    History  Substance Use Topics  . Smoking status: Former Smoker -- 5 years    Types: Cigarettes  . Smokeless tobacco: Never Used  . Alcohol Use: No   OB History   Grav Para Term Preterm Abortions TAB SAB Ect Mult Living   '3 1 1  1  1   1     '$ Review of Systems  Constitutional: Positive for fatigue.  Respiratory: Positive for shortness of breath.   Cardiovascular: Positive for chest pain. Negative for palpitations and syncope.  Gastrointestinal: Negative for vomiting.  All other systems reviewed and are negative.    Allergies  Gabapentin; Metoclopramide hcl; Topamax; Codeine; Latex; and Tape  Home Medications   Current Outpatient Rx  Name  Route  Sig  Dispense  Refill  . acetaminophen (TYLENOL) 500 MG tablet   Oral   Take 1,000 mg by mouth every 6 (six) hours as needed for pain.         . Ascorbic Acid (VITAMIN C) 100 MG CHEW   Oral   Chew 2 tablets by mouth daily.         Marland Kitchen BIOTIN PO   Oral   Take 1 tablet by mouth daily.          . calcium carbonate (TUMS - DOSED IN MG ELEMENTAL CALCIUM) 500 MG chewable tablet   Oral   Chew 3 tablets by mouth daily as needed for indigestion or heartburn.         . Cholecalciferol (VITAMIN D3) 2000 UNITS TABS   Oral   Take 1 tablet by mouth daily.         . clobetasol ointment (TEMOVATE) 0.05 %   Topical   Apply 1 application topically 2 (two) times daily. Apply to affected area   30 g   2   . clonazePAM (KLONOPIN) 0.5 MG tablet   Oral   Take 0.25-0.5 mg by mouth 2 (two) times daily as needed for anxiety.         . Cyanocobalamin (VITAMIN B-12) 500 MCG SUBL   Sublingual   Place 2 tablets under the tongue daily.         Marland Kitchen docusate sodium (COLACE) 100 MG capsule   Oral    Take 100 mg by mouth 2 (two) times daily.         . ferrous fumarate (HEMOCYTE - 106 MG FE) 325 (106 FE) MG TABS  tablet   Oral   Take 1 tablet by mouth.         . hydrOXYzine (ATARAX/VISTARIL) 25 MG tablet   Oral   Take 25 mg by mouth 2 (two) times daily as needed for anxiety or itching.         . Multiple Vitamins-Minerals (ZINC PO)   Oral   Take by mouth daily.         Marland Kitchen OVER THE COUNTER MEDICATION   Oral   Take 1 tablet by mouth daily. Potassium, magnesium, calcium combination.         . Phenazopyridine HCl (AZO-STANDARD PO)   Oral   Take 2 tablets by mouth as needed. Urinary discomfort         . polyethylene glycol (MIRALAX / GLYCOLAX) packet   Oral   Take 17 g by mouth daily as needed. constipation         . potassium chloride (K-DUR) 10 MEQ tablet   Oral   Take 1 tablet (10 mEq total) by mouth daily.   30 tablet   11   . Prenatal Vit-Fe Fumarate-FA (PRENATAL MULTIVITAMIN) TABS   Oral   Take 1 tablet by mouth at bedtime.          . promethazine (PHENERGAN) 25 MG tablet   Oral   Take 1 tablet (25 mg total) by mouth every 6 (six) hours as needed for nausea.   22 tablet   3    Triage Vitals: BP 119/69  Pulse 92  Temp(Src) 98.1 F (36.7 C) (Oral)  Resp 21  Ht 5\' 6"  (1.676 m)  Wt 209 lb 1.6 oz (94.847 kg)  BMI 33.77 kg/m2  SpO2 100%  LMP 11/19/2013  Physical Exam  Nursing note and vitals reviewed. Constitutional: She is oriented to person, place, and time. She appears well-developed and well-nourished. No distress.  HENT:  Head: Normocephalic and atraumatic.  Mouth/Throat: Oropharynx is clear and moist.  Eyes: EOM are normal.  Neck: Neck supple. No tracheal deviation present.  Cardiovascular: Normal rate, regular rhythm, normal heart sounds and intact distal pulses.   Pulmonary/Chest: Effort normal and breath sounds normal. No respiratory distress. She exhibits no tenderness.  Abdominal: Soft. Bowel sounds are normal. She exhibits no  distension. There is no tenderness. There is no rebound and no guarding.  Musculoskeletal: Normal range of motion. She exhibits no edema and no tenderness.  Neurological: She is alert and oriented to person, place, and time. No cranial nerve deficit.  Skin: Skin is warm and dry.  Psychiatric: She has a normal mood and affect. Her behavior is normal.    ED Course  Procedures (including critical care time)  Medications  potassium chloride SA (K-DUR,KLOR-CON) CR tablet 40 mEq (40 mEq Oral Given 02/28/14 0021)    DIAGNOSTIC STUDIES: Oxygen Saturation is 100% on RA, normal by my interpretation.    Fetal heart rate recorded 155  COORDINATION OF CARE: 11:45 PM-Orthostatics reviewed. Laying: BP 115/69, P88. Sitting: BP 120/66, P91. Standing: BP 116/73 and P102. Discussed treatment plan which includes CBC panel, BMP and UA with pt at bedside and pt agreed to plan.   12:27 AM-Informed pt of lab work results. She reports no more episodes of CP. No changes on cardiac monitor - remains normal sinus. Pt reports chronic hypokalemia. Discussed discharge plan which includes f/u with Cards and OB with pt and pt agreed to plan. Addressed symptoms to return for with pt and she states understanding.  Labs Review Results for  orders placed during the hospital encounter of 02/27/14  URINALYSIS, ROUTINE W REFLEX MICROSCOPIC      Result Value Ref Range   Color, Urine YELLOW  YELLOW   APPearance CLEAR  CLEAR   Specific Gravity, Urine 1.020  1.005 - 1.030   pH 6.0  5.0 - 8.0   Glucose, UA NEGATIVE  NEGATIVE mg/dL   Hgb urine dipstick NEGATIVE  NEGATIVE   Bilirubin Urine NEGATIVE  NEGATIVE   Ketones, ur NEGATIVE  NEGATIVE mg/dL   Protein, ur NEGATIVE  NEGATIVE mg/dL   Urobilinogen, UA 0.2  0.0 - 1.0 mg/dL   Nitrite NEGATIVE  NEGATIVE   Leukocytes, UA NEGATIVE  NEGATIVE  CBC      Result Value Ref Range   WBC 6.0  4.0 - 10.5 K/uL   RBC 3.64 (*) 3.87 - 5.11 MIL/uL   Hemoglobin 10.4 (*) 12.0 - 15.0 g/dL    HCT 30.4 (*) 36.0 - 46.0 %   MCV 83.5  78.0 - 100.0 fL   MCH 28.6  26.0 - 34.0 pg   MCHC 34.2  30.0 - 36.0 g/dL   RDW 22.2 (*) 11.5 - 15.5 %   Platelets 154  150 - 400 K/uL  BASIC METABOLIC PANEL      Result Value Ref Range   Sodium 138  137 - 147 mEq/L   Potassium 3.5 (*) 3.7 - 5.3 mEq/L   Chloride 104  96 - 112 mEq/L   CO2 24  19 - 32 mEq/L   Glucose, Bld 92  70 - 99 mg/dL   BUN 9  6 - 23 mg/dL   Creatinine, Ser 0.43 (*) 0.50 - 1.10 mg/dL   Calcium 8.7  8.4 - 10.5 mg/dL   GFR calc non Af Amer >90  >90 mL/min   GFR calc Af Amer >90  >90 mL/min   US Ob Comp Less 14 Wks  02/12/2014   DATING AND VIABILITY SONOGRAM   Nyeemah Jennette Friday is a 28 y.o. year old G52P1011 with EDD 09/02/2014 which  would correlate to  [redacted]w[redacted]d weeks gestation.  Pt had a previous u/s at  [facility] that had shown a sub-chorionic hemorrhage.  GESTATION: [place]     FETAL ACTIVITY:          Heart rate         179 bpm          The fetus is active.  CERVIX: Measures 3.2 cm  ADNEXA: The ovaries are normal.   GESTATIONAL AGE AND  BIOMETRICS:  Gestational criteria: Estimated Date of Delivery: 09/02/14 by U/S now at  [redacted]w[redacted]d  Previous Scans:3  GESTATIONAL SAC            mm          weeks  CROWN RUMP LENGTH           44 mm         11+0 weeks                                                                               AVERAGE EGA(BY THIS SCAN):   11+0 weeks  TECHNICIAN COMMENTS:  U/S(10+4wks)-single active fetus, CRL c/w dates, cx appears  closed  (3.2cm), bilateral adnexa wnl, FHR- 179 bpm, no [place] noted on today's exam  A copy of this report including all images has been saved and backed up to  a second source for retrieval if needed. All measures and details of the  anatomical scan, placentation, fluid volume and pelvic anatomy are  contained in that report.  Lazarus Gowda 02/08/2014 1:39 PM Christ Kick Strutz is a 28 y.o. year old G73P1011 with early u/s which would  correlate to  [redacted]w[redacted]d weeks gestation.  She is here today for a  confirmatory  initial sonogram. And review of prior sch.   Impression:  Single viable  intrauterine pregnancy is identified with a CRL of 4.4 cm,  for EGA of [redacted] weeks and 0 days.   Measurement  is in agreement with dating as stated above.  As a result, the working EDD is now 09/02/14  The fetal heart rate is 179 bpm.  Otherwise the sonogram is normal.  The ovaries are normal. Adnexa nl   The cervix is 3.2 cm.  TFERGUSON,JOHN V 02/12/2014 10:58 PM       EKG Interpretation   Date/Time:  Wednesday February 27 2014 22:05:18 EDT Ventricular Rate:  92 PR Interval:  166 QRS Duration: 80 QT Interval:  354 QTC Calculation: 437 R Axis:   45 Text Interpretation:  Normal sinus rhythm with sinus arrhythmia Normal ECG  When compared with ECG of 24-Dec-2013 15:59, No significant change was  found Confirmed by Ramel Tobon  MD, Cerita Rabelo ([id number]) on 02/27/2014 11:24:42 PM      MDM   Final diagnoses:  Chest pain  Hypokalemia  Anemia   Patient presents with intermittent chest pains. She was recently told that she had runs of SVT on Holter monitoring. She has scheduled followup with cardiology in 3 days to discuss Holter monitor results. She expresses a good deal of anxiety about this. She is asymptomatic in the emergency department. No arrhythmia on 12-lead EKG or cardiac monitoring. Normal pulmonary exam with oxygen saturations 100% on room air. No respiratory complaints. Presentation does not suggest PE and I discussed with patient, do not feel a chest x-ray is indicated at this time. She was observed without return of symptoms. She is a reliable historian and agrees to strict return precautions for any difficulty breathing or persistent chest pain.  Vital signs and nursing notes reviewed and considered.    I personally performed the services described in this documentation, which was scribed in my presence. The recorded information has been reviewed and is accurate.       Teressa Lower, MD 02/28/14 [phone]

## 2014-02-27 NOTE — ED Notes (Signed)
Complaining of chest pain that has been bothering her all day, comes and goes per pt. Was placed on a cardiac monitor to wear at home and had several periods of SVT and had several periods with PVCs. Supposed to see [facility] on Monday. Gets short of breath when it happens. [redacted] weeks pregnant.

## 2014-02-28 ENCOUNTER — Ambulatory Visit (INDEPENDENT_AMBULATORY_CARE_PROVIDER_SITE_OTHER): Payer: 59

## 2014-02-28 ENCOUNTER — Telehealth: Payer: Self-pay

## 2014-02-28 ENCOUNTER — Telehealth: Payer: Self-pay | Admitting: Adult Health

## 2014-02-28 ENCOUNTER — Other Ambulatory Visit: Payer: Self-pay | Admitting: Obstetrics & Gynecology

## 2014-02-28 ENCOUNTER — Other Ambulatory Visit: Payer: Medicaid Other

## 2014-02-28 DIAGNOSIS — Z8759 Personal history of other complications of pregnancy, childbirth and the puerperium: Secondary | ICD-10-CM

## 2014-02-28 DIAGNOSIS — O9989 Other specified diseases and conditions complicating pregnancy, childbirth and the puerperium: Secondary | ICD-10-CM | POA: Diagnosis not present

## 2014-02-28 DIAGNOSIS — O0990 Supervision of high risk pregnancy, unspecified, unspecified trimester: Secondary | ICD-10-CM

## 2014-02-28 DIAGNOSIS — Z36 Encounter for antenatal screening of mother: Secondary | ICD-10-CM

## 2014-02-28 DIAGNOSIS — O09299 Supervision of pregnancy with other poor reproductive or obstetric history, unspecified trimester: Secondary | ICD-10-CM

## 2014-02-28 LAB — CBC
HCT: 30.4 % — ABNORMAL LOW (ref 36.0–46.0)
Hemoglobin: 10.4 g/dL — ABNORMAL LOW (ref 12.0–15.0)
MCH: 28.6 pg (ref 26.0–34.0)
MCHC: 34.2 g/dL (ref 30.0–36.0)
MCV: 83.5 fL (ref 78.0–100.0)
PLATELETS: 154 10*3/uL (ref 150–400)
RBC: 3.64 MIL/uL — ABNORMAL LOW (ref 3.87–5.11)
RDW: 22.2 % — AB (ref 11.5–15.5)
WBC: 6 10*3/uL (ref 4.0–10.5)

## 2014-02-28 LAB — BASIC METABOLIC PANEL
BUN: 9 mg/dL (ref 6–23)
CHLORIDE: 104 meq/L (ref 96–112)
CO2: 24 mEq/L (ref 19–32)
Calcium: 8.7 mg/dL (ref 8.4–10.5)
Creatinine, Ser: 0.43 mg/dL — ABNORMAL LOW (ref 0.50–1.10)
GFR calc non Af Amer: >90 mL/min (ref >90)
Glucose, Bld: 92 mg/dL (ref 70–99)
POTASSIUM: 3.5 meq/L — AB (ref 3.7–5.3)
SODIUM: 138 meq/L (ref 137–147)

## 2014-02-28 MED ORDER — CLONAZEPAM 0.5 MG PO TABS: 0.2500 mg | ORAL_TABLET | Freq: Two times a day (BID) | ORAL | Status: DC | PRN

## 2014-02-28 MED ORDER — POTASSIUM CHLORIDE CRYS ER 20 MEQ PO TBCR
40.0000 meq | EXTENDED_RELEASE_TABLET | Freq: Once | ORAL | Status: AC
  Administered 2014-02-28: 40 meq via ORAL
  Filled 2014-02-28: qty 2

## 2014-02-28 NOTE — Discharge Instructions (Signed)
Anemia, Nonspecific Anemia is a condition in which the concentration of red blood cells or hemoglobin in the blood is below normal. Hemoglobin is a substance in red blood cells that carries oxygen to the tissues of the body. Anemia results in not enough oxygen reaching these tissues.  CAUSES  Common causes of anemia include:   Excessive bleeding. Bleeding may be internal or external. This includes excessive bleeding from periods (in women) or from the intestine.   Poor nutrition.   Chronic kidney, thyroid, and liver disease.  Bone marrow disorders that decrease red blood cell production.  Cancer and treatments for cancer.  HIV, AIDS, and their treatments.  Spleen problems that increase red blood cell destruction.  Blood disorders.  Excess destruction of red blood cells due to infection, medicines, and autoimmune disorders. SIGNS AND SYMPTOMS   Minor weakness.   Dizziness.   Headache.  Palpitations.   Shortness of breath, especially with exercise.   Paleness.  Cold sensitivity.  Indigestion.  Nausea.  Difficulty sleeping.  Difficulty concentrating. Symptoms may occur suddenly or they may develop slowly.  DIAGNOSIS  Additional blood tests are often needed. These help your health care provider determine the best treatment. Your health care provider will check your stool for blood and look for other causes of blood loss.  TREATMENT  Treatment varies depending on the cause of the anemia. Treatment can include:   Supplements of iron, vitamin Z61, or folic acid.   Hormone medicines.   A blood transfusion. This may be needed if blood loss is severe.   Hospitalization. This may be needed if there is significant continual blood loss.   Dietary changes.  Spleen removal. HOME CARE INSTRUCTIONS Keep all follow-up appointments. It often takes many weeks to correct anemia, and having your health care provider check on your condition and your response to  treatment is very important. SEEK IMMEDIATE MEDICAL CARE IF:   You develop extreme weakness, shortness of breath, or chest pain.   You become dizzy or have trouble concentrating.  You develop heavy vaginal bleeding.   You develop a rash.   You have bloody or black, tarry stools.   You faint.   You vomit up blood.   You vomit repeatedly.   You have abdominal pain.  You have a fever or persistent symptoms for more than 2 3 days.   You have a fever and your symptoms suddenly get worse.   You are dehydrated.  MAKE SURE YOU:  Understand these instructions.  Will watch your condition.  Will get help right away if you are not doing well or get worse. Document Released: 12/30/2004 Document Revised: 07/25/2013 Document Reviewed: 05/18/2013 [facility] Patient Information 2014 [place].  Chest Pain (Nonspecific) It is often hard to give a specific diagnosis for the cause of chest pain. There is always a chance that your pain could be related to something serious, such as a heart attack or a blood clot in the lungs. You need to follow up with your caregiver for further evaluation. CAUSES   Heartburn.  Pneumonia or bronchitis.  Anxiety or stress.  Inflammation around your heart (pericarditis) or lung (pleuritis or pleurisy).  A blood clot in the lung.  A collapsed lung (pneumothorax). It can develop suddenly on its own (spontaneous pneumothorax) or from injury (trauma) to the chest.  Shingles infection (herpes zoster virus). The chest wall is composed of bones, muscles, and cartilage. Any of these can be the source of the pain.  The bones can  be bruised by injury.  The muscles or cartilage can be strained by coughing or overwork.  The cartilage can be affected by inflammation and become sore (costochondritis). DIAGNOSIS  Lab tests or other studies, such as X-rays, electrocardiography, stress testing, or cardiac imaging, may be needed to find the cause of  your pain.  TREATMENT   Treatment depends on what may be causing your chest pain. Treatment may include:  Acid blockers for heartburn.  Anti-inflammatory medicine.  Pain medicine for inflammatory conditions.  Antibiotics if an infection is present.  You may be advised to change lifestyle habits. This includes stopping smoking and avoiding alcohol, caffeine, and chocolate.  You may be advised to keep your head raised (elevated) when sleeping. This reduces the chance of acid going backward from your stomach into your esophagus.  Most of the time, nonspecific chest pain will improve within 2 to 3 days with rest and mild pain medicine. HOME CARE INSTRUCTIONS   If antibiotics were prescribed, take your antibiotics as directed. Finish them even if you start to feel better.  For the next few days, avoid physical activities that bring on chest pain. Continue physical activities as directed.  Do not smoke.  Avoid drinking alcohol.  Only take over-the-counter or prescription medicine for pain, discomfort, or fever as directed by your caregiver.  Follow your caregiver's suggestions for further testing if your chest pain does not go away.  Keep any follow-up appointments you made. If you do not go to an appointment, you could develop lasting (chronic) problems with pain. If there is any problem keeping an appointment, you must call to reschedule. SEEK MEDICAL CARE IF:   You think you are having problems from the medicine you are taking. Read your medicine instructions carefully.  Your chest pain does not go away, even after treatment.  You develop a rash with blisters on your chest. SEEK IMMEDIATE MEDICAL CARE IF:   You have increased chest pain or pain that spreads to your arm, neck, jaw, back, or abdomen.  You develop shortness of breath, an increasing cough, or you are coughing up blood.  You have severe back or abdominal pain, feel nauseous, or vomit.  You develop severe  weakness, fainting, or chills.  You have a fever. THIS IS AN EMERGENCY. Do not wait to see if the pain will go away. Get medical help at once. Call your local emergency services (911 in [place] Do not drive yourself to the hospital. MAKE SURE YOU:   Understand these instructions.  Will watch your condition.  Will get help right away if you are not doing well or get worse. Document Released: 09/01/2005 Document Revised: 02/14/2012 Document Reviewed: 06/27/2008 [facility] Patient Information 2014 [place].

## 2014-02-28 NOTE — Telephone Encounter (Signed)
Pt pharmacy will not refill the Klonopin prescription now, stating refill is to early. Please advise.

## 2014-02-28 NOTE — Telephone Encounter (Signed)
Odis was here in office for IT/NT and [name] the [name] tech told me that her NT was 3.5 mm which is thickened, so I explained this to Penny [name], and she said she was losing her insurance 3/31 so I gave her a proof of pregnancy to get pregnancy meidcaid and she needed refill on Klonopin and Dr Elonda Husky refilled that for her and he is aware of NT result.. An appointment was made for 3/30 for her to come in and discuss with Dr Elonda Husky.She was teary and said there is some family stress too.

## 2014-02-28 NOTE — Progress Notes (Signed)
Nuchal Translucency US performed today.  Active fetus at 13+3 weeks measuring 8.1cm CRL (14+0 weeks).  Nasal bone visualized.  NT measuring 3.21mm.  Lt ovary within normal limits.  Right ovary not visualized but no adnexal abnormality noted.

## 2014-03-01 ENCOUNTER — Telehealth: Payer: Self-pay | Admitting: Obstetrics & Gynecology

## 2014-03-01 DIAGNOSIS — Z029 Encounter for administrative examinations, unspecified: Secondary | ICD-10-CM

## 2014-03-01 MED ORDER — CLONAZEPAM 0.5 MG PO TABS: 0.2500 mg | ORAL_TABLET | Freq: Three times a day (TID) | ORAL | Status: DC | PRN

## 2014-03-01 NOTE — Telephone Encounter (Signed)
Crystal, RN spoke with pharmacy about changing the dosing frequency on Klonopin. [place]

## 2014-03-01 NOTE — Telephone Encounter (Signed)
I am changing the dosing frequency so please have the pharmacy go ahead and refill

## 2014-03-01 NOTE — Telephone Encounter (Signed)
Pt came by the office and is aware that med was sent to pharmacy. [place]

## 2014-03-04 ENCOUNTER — Ambulatory Visit (HOSPITAL_COMMUNITY)
Admission: RE | Admit: 2014-03-04 | Discharge: 2014-03-04 | Disposition: A | Discharge status: Home / Self Care | Payer: 59 | Source: Ambulatory Visit | Attending: Internal Medicine | Admitting: Internal Medicine

## 2014-03-04 ENCOUNTER — Ambulatory Visit (INDEPENDENT_AMBULATORY_CARE_PROVIDER_SITE_OTHER): Payer: 59 | Admitting: Internal Medicine

## 2014-03-04 ENCOUNTER — Encounter: Payer: Self-pay | Admitting: Internal Medicine

## 2014-03-04 ENCOUNTER — Encounter (INDEPENDENT_AMBULATORY_CARE_PROVIDER_SITE_OTHER): Payer: Self-pay

## 2014-03-04 ENCOUNTER — Ambulatory Visit (INDEPENDENT_AMBULATORY_CARE_PROVIDER_SITE_OTHER): Payer: 59 | Admitting: Obstetrics & Gynecology

## 2014-03-04 VITALS — BP 115/64 | HR 98 | Ht 72.0 in | Wt 206.0 lb

## 2014-03-04 VITALS — BP 112/60 | Wt 206.0 lb

## 2014-03-04 DIAGNOSIS — Z87891 Personal history of nicotine dependence: Secondary | ICD-10-CM | POA: Insufficient documentation

## 2014-03-04 DIAGNOSIS — O99019 Anemia complicating pregnancy, unspecified trimester: Secondary | ICD-10-CM

## 2014-03-04 DIAGNOSIS — R55 Syncope and collapse: Secondary | ICD-10-CM

## 2014-03-04 DIAGNOSIS — O99340 Other mental disorders complicating pregnancy, unspecified trimester: Secondary | ICD-10-CM

## 2014-03-04 DIAGNOSIS — R0602 Shortness of breath: Secondary | ICD-10-CM | POA: Insufficient documentation

## 2014-03-04 DIAGNOSIS — R002 Palpitations: Secondary | ICD-10-CM | POA: Insufficient documentation

## 2014-03-04 DIAGNOSIS — O99840 Bariatric surgery status complicating pregnancy, unspecified trimester: Secondary | ICD-10-CM

## 2014-03-04 DIAGNOSIS — O09299 Supervision of pregnancy with other poor reproductive or obstetric history, unspecified trimester: Secondary | ICD-10-CM

## 2014-03-04 DIAGNOSIS — O9989 Other specified diseases and conditions complicating pregnancy, childbirth and the puerperium: Principal | ICD-10-CM

## 2014-03-04 DIAGNOSIS — O99891 Other specified diseases and conditions complicating pregnancy: Secondary | ICD-10-CM | POA: Insufficient documentation

## 2014-03-04 DIAGNOSIS — Z3480 Encounter for supervision of other normal pregnancy, unspecified trimester: Secondary | ICD-10-CM

## 2014-03-04 NOTE — Addendum Note (Signed)
Addended by: Barbarann Ehlers A on: 03/04/2014 03:22 PM   Modules accepted: Orders

## 2014-03-04 NOTE — Progress Notes (Signed)
*  PRELIMINARY RESULTS* Echocardiogram 2D Echocardiogram with saline contrast has been performed.  [place], [place] 03/04/2014, 3:32 PM

## 2014-03-04 NOTE — Patient Instructions (Signed)
Your physician recommends that you schedule a follow-up appointment to be determined after echo   Your physician has requested that you have an echocardiogram. Echocardiography is a painless test that uses sound waves to create images of your heart. It provides your doctor with information about the size and shape of your heart and how well your heart's chambers and valves are working. This procedure takes approximately one hour. There are no restrictions for this procedure.    Your physician recommends that you continue on your current medications as directed. Please refer to the Current Medication list given to you today.    Thank you for choosing [facility] !

## 2014-03-04 NOTE — Progress Notes (Signed)
HPI Patient is a 28 yo who is referred for evaluation of palpitations, SOB and CP The patient has had an intermittent history of palpitations with weak feeling  These, however, have gotten much worse since this pregnancy began.  She says she will have epiosdes when her heart starts to race and she gets week/ very dizzy.  She will go lie down  She says the hearrt racing will continue for about 30 min to 2 hours.  Occurs once every 3 days. She was set up for holter montior but the 2 days she wore it she did not have severe symptoms. She also has episodes of chest discomfort/tightness.  Not associated with activity.  Feels like cant get good breath.Happened prior to preg but worse since pregnant  SHe used to work in [place] off duty by [employer]  She did undergo transfusion earlier this pregnancy    SHe has stopped smoking  Cut back on caffeine though still has cup coffee, a little tea. Drinks water and gatorade  Allergies  Allergen Reactions  . Gabapentin Other (See Comments)    PATIENT WAS UNRESPONSIVE AFTER TAKING MEDICATION, BUT VITALS REMAINED STABLE  . Metoclopramide Hcl Anxiety    Patient felt like she was trapped in a box, and cant get out.  Temporary loss of movement, weakness, tingling.    . Topamax [Topiramate] Shortness Of Breath and Other (See Comments)    Adverse Side effects, changed taste for some time, numbness and tingling in extremities.    . Codeine Nausea And Vomiting  . Latex Rash  . Tape Itching and Rash    Adhesive   Prolonged skin rash and itching.      Current Outpatient Prescriptions  Medication Sig Dispense Refill  . acetaminophen (TYLENOL) 500 MG tablet Take 1,000 mg by mouth every 6 (six) hours as needed for pain.      . Ascorbic Acid (VITAMIN C) 100 MG CHEW Chew 2 tablets by mouth daily.      Marland Kitchen BIOTIN PO Take 1 tablet by mouth daily.       . calcium carbonate (TUMS - DOSED IN MG ELEMENTAL CALCIUM) 500 MG chewable tablet Chew 3 tablets by mouth daily as needed for  indigestion or heartburn.      . Cholecalciferol (VITAMIN D3) 2000 UNITS TABS Take 1 tablet by mouth daily.      . clobetasol ointment (TEMOVATE) 1.61 % Apply 1 application topically 2 (two) times daily. Apply to affected area  30 g  2  . clonazePAM (KLONOPIN) 0.5 MG tablet Take 0.5-1 tablets (0.25-0.5 mg total) by mouth 3 (three) times daily as needed for anxiety.  90 tablet  2  . Cyanocobalamin (VITAMIN B-12) 500 MCG SUBL Place 2 tablets under the tongue daily.      Marland Kitchen docusate sodium (COLACE) 100 MG capsule Take 100 mg by mouth 2 (two) times daily.      . ferrous fumarate (HEMOCYTE - 106 MG FE) 325 (106 FE) MG TABS tablet Take 1 tablet by mouth.      . hydrOXYzine (ATARAX/VISTARIL) 25 MG tablet Take 25 mg by mouth 2 (two) times daily as needed for anxiety or itching.      . Multiple Vitamins-Minerals (ZINC PO) Take by mouth daily.      Marland Kitchen OVER THE COUNTER MEDICATION Take 1 tablet by mouth daily. Potassium, magnesium, calcium combination.      . Phenazopyridine HCl (AZO-STANDARD PO) Take 2 tablets by mouth as needed. Urinary discomfort      .  polyethylene glycol (MIRALAX / GLYCOLAX) packet Take 17 g by mouth daily as needed. constipation      . potassium chloride (K-DUR) 10 MEQ tablet Take 1 tablet (10 mEq total) by mouth daily.  30 tablet  11  . Prenatal Vit-Fe Fumarate-FA (PRENATAL MULTIVITAMIN) TABS Take 1 tablet by mouth at bedtime.       . promethazine (PHENERGAN) 25 MG tablet Take 1 tablet (25 mg total) by mouth every 6 (six) hours as needed for nausea.  22 tablet  3   No current facility-administered medications for this visit.    Past Medical History  Diagnosis Date  . Migraines   . Interstitial cystitis   . IUD FEB 2010  . Gastritis JULY 2011  . Depression   . 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 Fatty liver   . Obesity (BMI 30-39.9) 2011 228 LBS BMI 36.8  . Iron deficiency anemia 07/23/2010  . Irritable bowel syndrome 2012 DIARRHEA    JUN 2012 TTG IgA 14.9  . Elevated  liver enzymes JUL 2011ALK PHOS 111-127 AST  143-267 ALT  213-321T BILI 0.6  ALB  3.7-4.06 Jun 2011 ALK PHOS 118 AST 24 ALT 42 T BILI 0.4 ALB 3.9  . Chronic daily headache   . Ovarian cyst   . PONV (postoperative nausea and vomiting)   . ADD (attention deficit disorder)   . RLQ abdominal pain 07/18/2013  . Menorrhagia 07/18/2013  . Anxiety   . Potassium (K) deficiency   . Dysmenorrhea 09/18/2013  . Stress 09/18/2013  . Patient desires pregnancy 09/18/2013  . Pregnant 12/25/2013    Past Surgical History  Procedure Laterality Date  . Gab  2007    in [place]-POUCH 5 CM  . Cholecystectomy  2005    biliary dyskinesia  . Cath self every nite      for sodium bicarb injection (discontinued 2013)  . Colonoscopy  JUN 2012 ABD PN/DIARRHEA WITH PROPOFOL    NL COLON  . Upper gastrointestinal endoscopy  JULY 2011 NAUSEA-D125,V6, PH 25    Bx; GASTRITIS, POUCH-5 CM LONG  . Dilation and curettage of uterus    . Tonsillectomy    . Gastric bypass  06/2006  . Savory dilation  06/20/2012    QZE:[id number] gastritis/Ulcer in the mid jejunum  . Tonsillectomy and adenoidectomy    . Esophagogastroduodenoscopy      Family History  Problem Relation Age of Onset  . Hemochromatosis Maternal Grandmother   . Migraines Maternal Grandmother   . Cancer Maternal Grandmother   . Hypertension Father   . Diabetes Father   . Coronary artery disease Father   . Migraines Paternal Grandmother   . Cancer Mother     breast  . Hemochromatosis Mother   . Coronary artery disease Paternal Grandfather     History   Social History  . Marital Status: Married    Spouse Name: N/A    Number of Children: 1  . Years of Education: N/A   Occupational History  .     Social History Main Topics  . Smoking status: Former Smoker -- 5 years    Types: Cigarettes  . Smokeless tobacco: Never Used  . Alcohol Use: No  . Drug Use: No  . Sexual Activity: Yes    Birth Control/ Protection: None   Other Topics Concern  .  Not on file   Social History Narrative  . No narrative on file    Review  of Systems:  All systems reviewed.  They are negative to the above problem except as previously stated.  Vital Signs: BP 115/64  Pulse 98  Ht 6' (1.829 m)  Wt 206 lb (93.441 kg)  BMI 27.93 kg/m2  SpO2 98%  LMP 11/19/2013  Physical Exam Patient is in NAD HEENT:  Normocephalic, atraumatic. EOMI, PERRLA.  Neck: JVP is normal.  No bruits.  Lungs: clear to auscultation. No rales no wheezes.  Heart: Regular rate and rhythm. Normal S1, S2. No S3.   No significant murmurs. PMI not displaced.  Abdomen:  Supple, nontender. Normal bowel sounds. No masses. No hepatomegaly.  Extremities:   Good distal pulses throughout. No lower extremity edema.  Musculoskeletal :moving all extremities.  Neuro:   alert and oriented x3.  CN II-XII grossly intact.  EKG  SR 92 bpm (3/25) Assessment and Plan:  1.  Palpitations.  May represent ST but concerning with rapid onset.  I will try to get patient approved for event monitor  Critical given pregnancy  I told her to stay hydrated.  Avoid excess caffeine.  If she gets dizzy she needs to sit/lie down so doesn't pass out.  2.  CP  I am not convinced ischemic  May be related BP.  She was not orthstatic on ER visit, but orthostasis is very common during this part of pregnancy.  Can lead to intercostal ischemia/pain Would set up for echo 3.  ANemia.  Being followed by OB

## 2014-03-05 ENCOUNTER — Telehealth: Payer: Self-pay | Admitting: *Deleted

## 2014-03-05 LAB — MATERNAL SCREEN, INTEGRATED #1

## 2014-03-05 NOTE — Telephone Encounter (Signed)
Done

## 2014-03-05 NOTE — Telephone Encounter (Signed)
Pt with [employer] is calling. Patient is requesting to be part of the assistance program. Penny [name] needs to ask some questions

## 2014-03-05 NOTE — Telephone Encounter (Signed)
I told Penny [name] yesterday at office visit that she was already enrolled already to get free monitor,info was faxed to [employer] billing suprv Leanor Rubenstein yesterday.[employer] needs to review claim which may take a day or two.Evelena Asa, clinical sales specialist, will have process complete today.   I spoke with husband and relayed this info to him

## 2014-03-05 NOTE — Telephone Encounter (Signed)
Pt brought in

## 2014-03-09 ENCOUNTER — Encounter: Payer: Self-pay | Admitting: Women's Health

## 2014-03-09 DIAGNOSIS — O283 Abnormal ultrasonic finding on antenatal screening of mother: Secondary | ICD-10-CM | POA: Insufficient documentation

## 2014-03-12 ENCOUNTER — Encounter: Payer: Self-pay | Admitting: Women's Health

## 2014-03-12 IMAGING — US US OB COMP LESS 14 WK
1 series · 14 of 20 positions shown · non-contrast
Comparison: None.

CLINICAL DATA: Pregnant patient with anemia.

EXAM:
OBSTETRIC <14 WK ULTRASOUND
TECHNIQUE: Transabdominal ultrasound was performed for evaluation of the
gestation as well as the maternal uterus and adnexal regions.

[Series 1: us ob comp less 14 wks · 20 acquisitions, 14 frames shown]
[im 1/20]
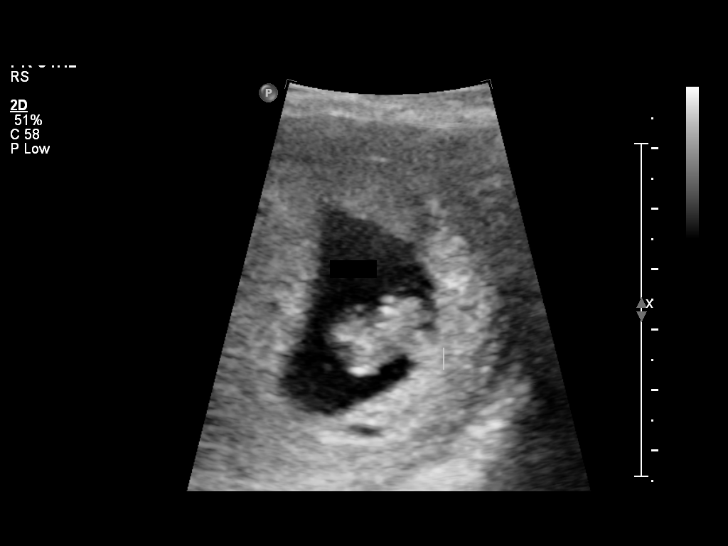
[im 3/20]
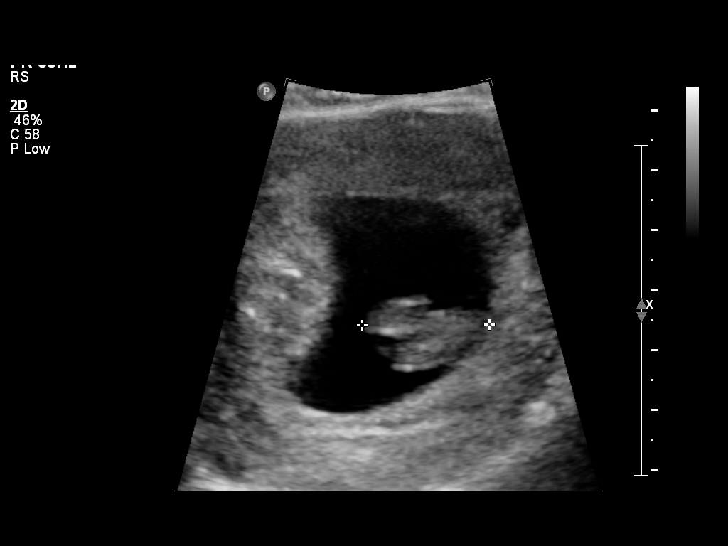
[im 4/20]
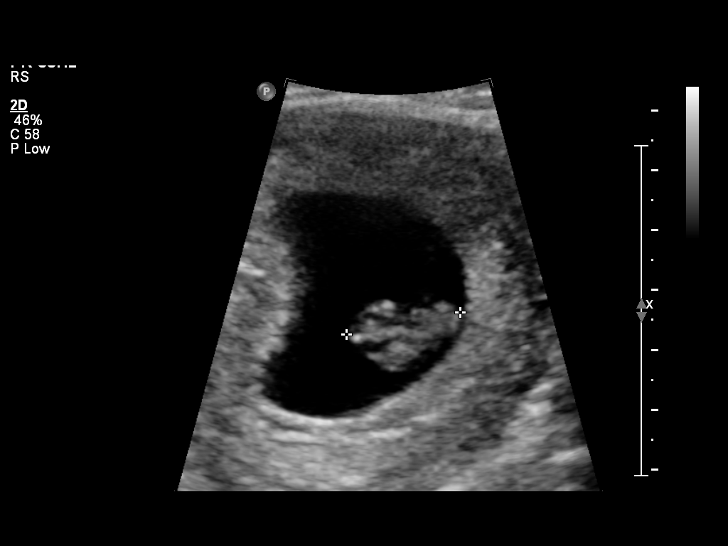
[im 6/20]
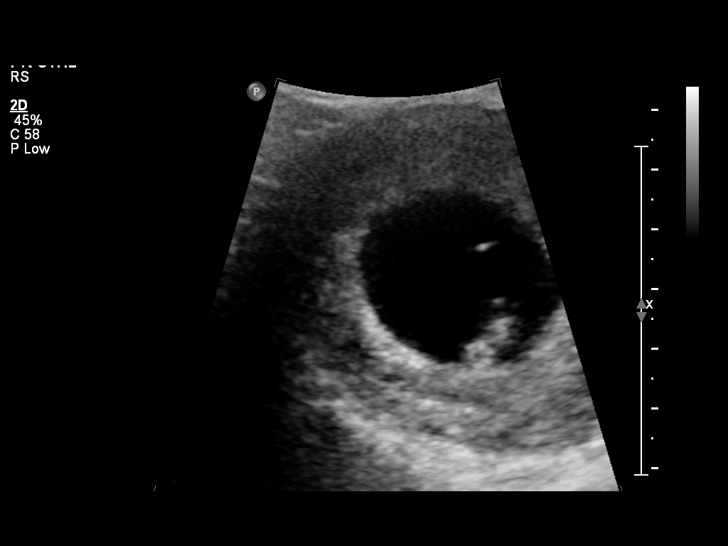
[im 7/20]
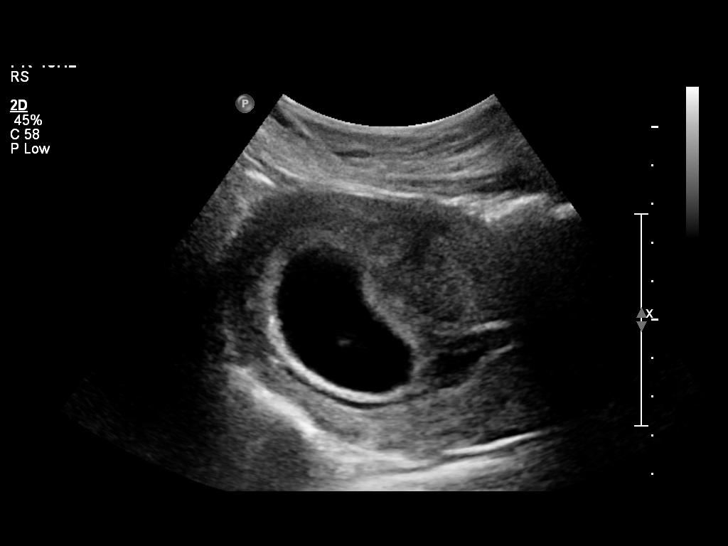
[im 8/20]
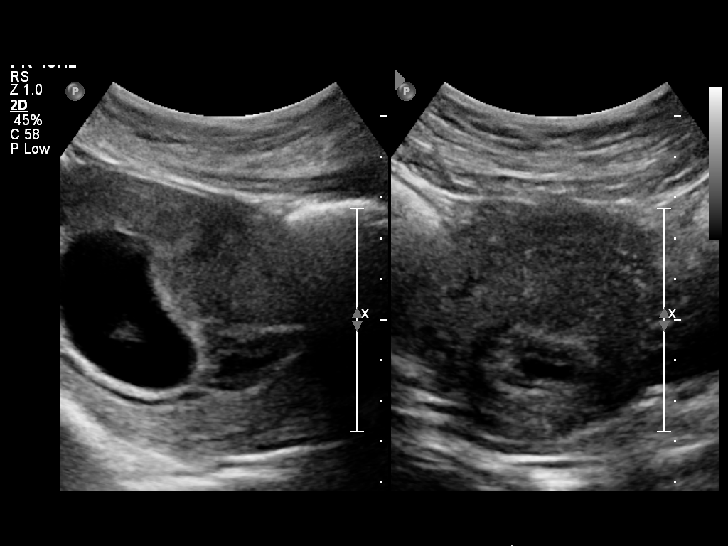
[im 10/20]
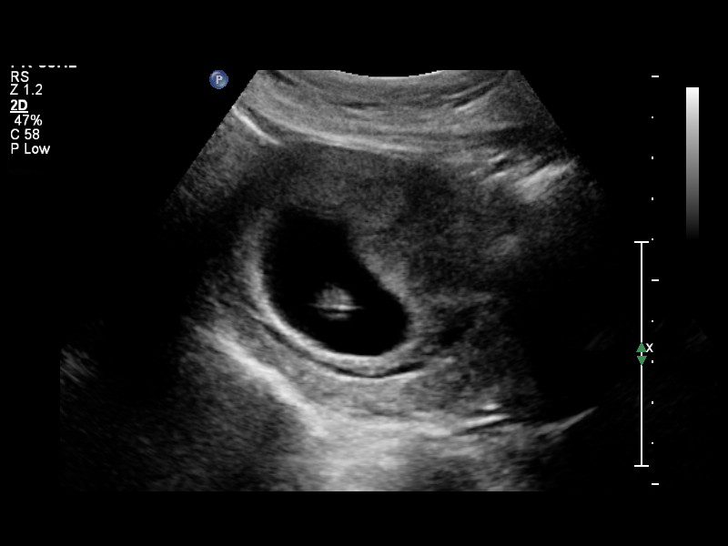
[im 11/20]
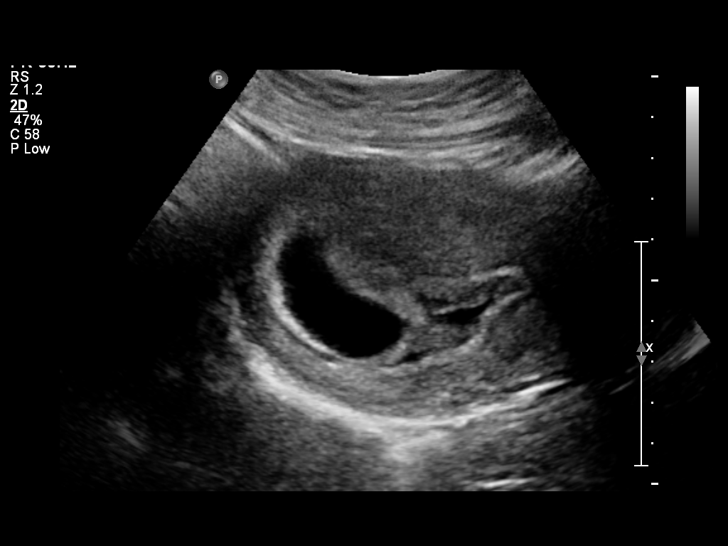
[im 13/20]
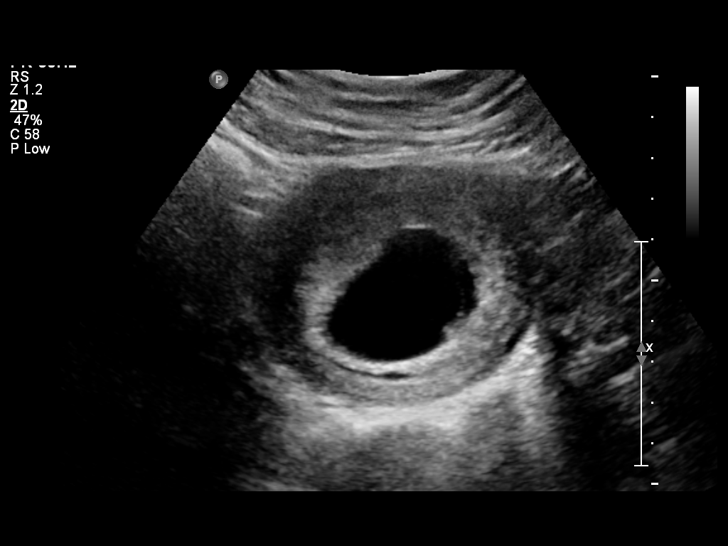
[im 14/20]
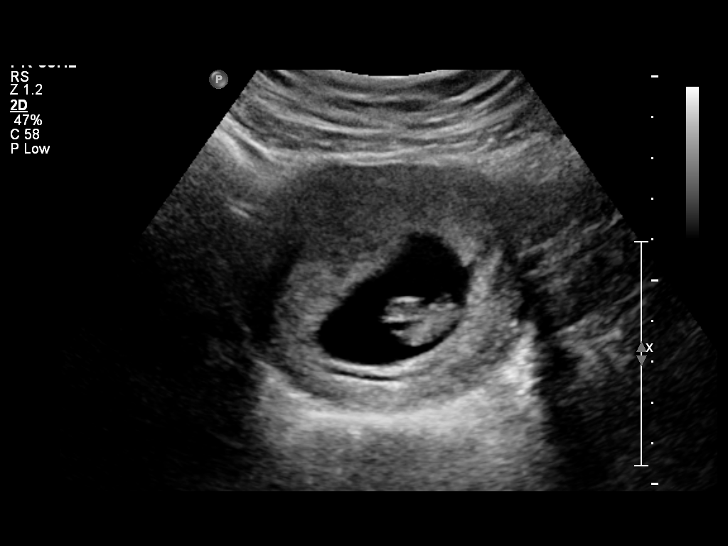
[im 16/20]
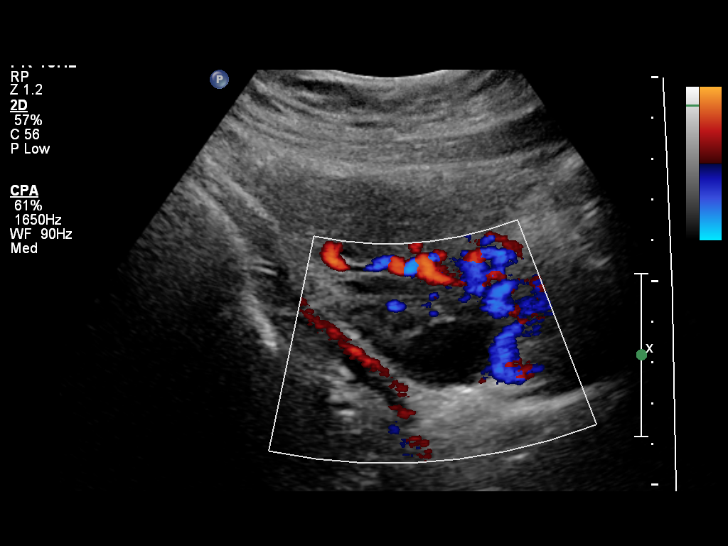
[im 17/20]
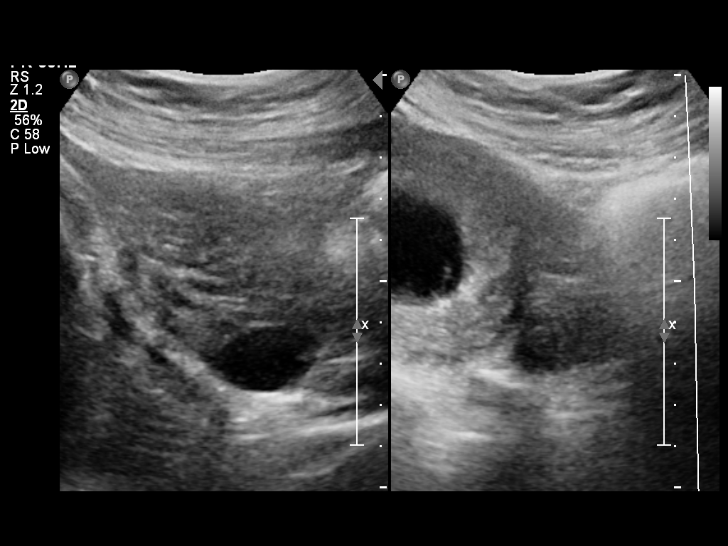
[im 18/20]
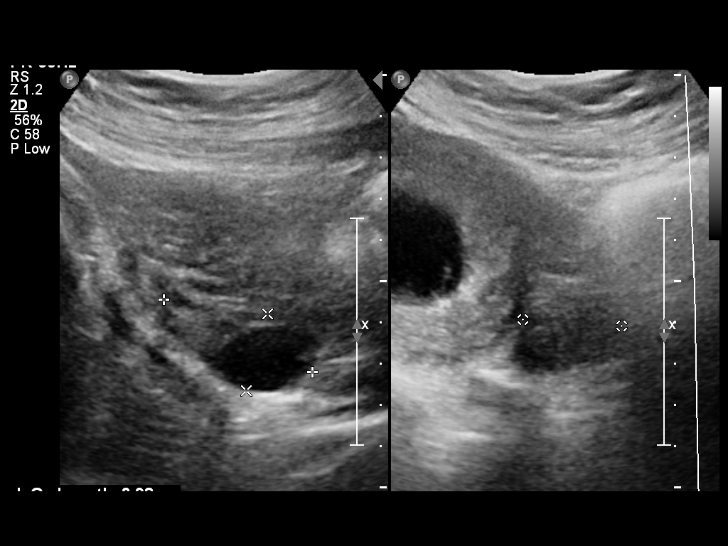
[im 20/20]
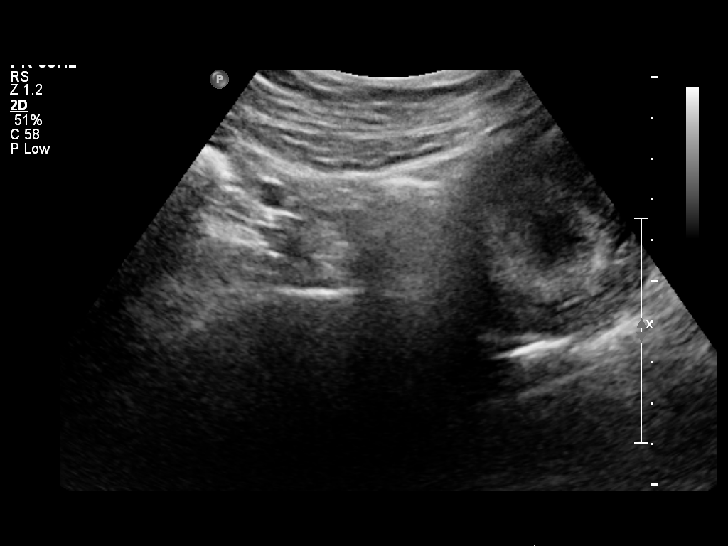

[14 of 20 positions shown; findings below may reference images not displayed]

FINDINGS: Intrauterine gestational sac: Visualized/normal in shape.
Subchorionic hemorrhage measuring 2.6 x 1.6 x 2.4 cm is identified.

Yolk sac:  Visualized.

Embryo:  Visualized.

Cardiac Activity: Detected.

Heart Rate: 183 bpm

CRL:   2.03  mm   8 w 5 d                  US EDC: 08/27/2014

Maternal uterus/adnexae: The right ovary is not visualized. Left
ovary appears normal.
IMPRESSION: Single living intrauterine pregnancy. Small subchorionic hemorrhage
is identified. The study is otherwise unremarkable.

## 2014-03-16 ENCOUNTER — Encounter (HOSPITAL_COMMUNITY): Payer: Self-pay | Admitting: *Deleted

## 2014-03-16 ENCOUNTER — Inpatient Hospital Stay (HOSPITAL_COMMUNITY)
Admission: AD | Admit: 2014-03-16 | Discharge: 2014-03-16 | Disposition: A | Discharge status: Home / Self Care | Payer: Medicaid Other | Source: Ambulatory Visit | Attending: Obstetrics & Gynecology | Admitting: Obstetrics & Gynecology

## 2014-03-16 DIAGNOSIS — B3731 Acute candidiasis of vulva and vagina: Secondary | ICD-10-CM | POA: Insufficient documentation

## 2014-03-16 DIAGNOSIS — N898 Other specified noninflammatory disorders of vagina: Secondary | ICD-10-CM | POA: Insufficient documentation

## 2014-03-16 DIAGNOSIS — R0602 Shortness of breath: Secondary | ICD-10-CM | POA: Insufficient documentation

## 2014-03-16 DIAGNOSIS — R109 Unspecified abdominal pain: Secondary | ICD-10-CM | POA: Insufficient documentation

## 2014-03-16 DIAGNOSIS — B373 Candidiasis of vulva and vagina: Secondary | ICD-10-CM

## 2014-03-16 DIAGNOSIS — Z87891 Personal history of nicotine dependence: Secondary | ICD-10-CM | POA: Insufficient documentation

## 2014-03-16 DIAGNOSIS — I739 Peripheral vascular disease, unspecified: Secondary | ICD-10-CM | POA: Insufficient documentation

## 2014-03-16 DIAGNOSIS — Z9884 Bariatric surgery status: Secondary | ICD-10-CM | POA: Insufficient documentation

## 2014-03-16 DIAGNOSIS — O2390 Unspecified genitourinary tract infection in pregnancy, unspecified trimester: Secondary | ICD-10-CM | POA: Insufficient documentation

## 2014-03-16 DIAGNOSIS — O26899 Other specified pregnancy related conditions, unspecified trimester: Secondary | ICD-10-CM

## 2014-03-16 LAB — URINALYSIS, ROUTINE W REFLEX MICROSCOPIC
BILIRUBIN URINE: NEGATIVE
Glucose, UA: NEGATIVE mg/dL
Hgb urine dipstick: NEGATIVE
Ketones, ur: NEGATIVE mg/dL
Leukocytes, UA: NEGATIVE
NITRITE: NEGATIVE
Protein, ur: NEGATIVE mg/dL
UROBILINOGEN UA: 0.2 mg/dL (ref 0.0–1.0)
pH: 6 (ref 5.0–8.0)

## 2014-03-16 LAB — WET PREP, GENITAL
Clue Cells Wet Prep HPF POC: NONE SEEN
Trich, Wet Prep: NONE SEEN

## 2014-03-16 MED ORDER — FLUCONAZOLE 150 MG PO TABS: 150.0000 mg | ORAL_TABLET | Freq: Every day | ORAL | Status: DC

## 2014-03-16 NOTE — MAU Note (Signed)
Having cramping since yesterday but worse today, esp since 1600. Some brown d/c. 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 Currently wearing cardiac monitor for 30days. Having some SOB but feels could be anxiety.

## 2014-03-16 NOTE — Progress Notes (Signed)
Written and verbal d/c instructions given by Cecille Rubin RN., and understanding voiced.

## 2014-03-16 NOTE — MAU Provider Note (Signed)
History     CSN: [id number]  Arrival date and time: 03/16/14 2055   First Provider Initiated Contact with Patient 03/16/14 2140      Chief Complaint  Patient presents with  . Abdominal Cramping  . Vaginal Discharge  . Shortness of Breath   HPI Comments: Penny [name] 28 y.o. [redacted]w[redacted]d G3P1011 presents to MAU with cramping since yesterday and brown discharge. She is patient of Dr Despina Hidden. She is currently wearing a Holter monitor for PAD. She has significant anxiety and other psych issues. She has had an U/S that shows IUP and today her FHR is 150  Abdominal Cramping  Vaginal Discharge Associated symptoms include abdominal pain.  Shortness of Breath Associated symptoms include abdominal pain.      Past Medical History  Diagnosis Date  . Migraines   . Interstitial cystitis   . IUD FEB 2010  . Gastritis JULY 2011  . Depression   . 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 Fatty liver   . Obesity (BMI 30-39.9) 2011 228 LBS BMI 36.8  . Iron deficiency anemia 07/23/2010  . Irritable bowel syndrome 2012 DIARRHEA    JUN 2012 TTG IgA 14.9  . Elevated liver enzymes JUL 2011ALK PHOS 111-127 AST  143-267 ALT  213-321T BILI 0.6  ALB  3.7-4.06 Jun 2011 ALK PHOS 118 AST 24 ALT 42 T BILI 0.4 ALB 3.9  . Chronic daily headache   . Ovarian cyst   . PONV (postoperative nausea and vomiting)   . ADD (attention deficit disorder)   . RLQ abdominal pain 07/18/2013  . Menorrhagia 07/18/2013  . Anxiety   . Potassium (K) deficiency   . Dysmenorrhea 09/18/2013  . Stress 09/18/2013  . Patient desires pregnancy 09/18/2013  . Pregnant 12/25/2013    Past Surgical History  Procedure Laterality Date  . Gab  2007    in [place]-POUCH 5 CM  . Cholecystectomy  2005    biliary dyskinesia  . Cath self every nite      for sodium bicarb injection (discontinued 2013)  . Colonoscopy  JUN 2012 ABD PN/DIARRHEA WITH PROPOFOL    NL COLON  . Upper gastrointestinal endoscopy  JULY 2011 NAUSEA-D125,V6, PH  25    Bx; GASTRITIS, POUCH-5 CM LONG  . Dilation and curettage of uterus    . Tonsillectomy    . Gastric bypass  06/2006  . Savory dilation  06/20/2012    NNK:WYRT gastritis/Ulcer in the mid jejunum  . Tonsillectomy and adenoidectomy    . Esophagogastroduodenoscopy      Family History  Problem Relation Age of Onset  . Hemochromatosis Maternal Grandmother   . Migraines Maternal Grandmother   . Cancer Maternal Grandmother   . Hypertension Father   . Diabetes Father   . Coronary artery disease Father   . Migraines Paternal Grandmother   . Cancer Mother     breast  . Hemochromatosis Mother   . Coronary artery disease Paternal Grandfather     History  Substance Use Topics  . Smoking status: Former Smoker -- 5 years    Types: Cigarettes  . Smokeless tobacco: Never Used  . Alcohol Use: No    Allergies:  Allergies  Allergen Reactions  . Gabapentin Other (See Comments)    PATIENT WAS UNRESPONSIVE AFTER TAKING MEDICATION, BUT VITALS REMAINED STABLE  . Metoclopramide Hcl Anxiety    Patient felt like she was trapped in a box, and cant get out.  Temporary  loss of movement, weakness, tingling.    . Topamax [Topiramate] Shortness Of Breath and Other (See Comments)    Adverse Side effects, changed taste for some time, numbness and tingling in extremities.    . Codeine Nausea And Vomiting  . Latex Rash  . Tape Itching and Rash    Adhesive   Prolonged skin rash and itching.      Prescriptions prior to admission  Medication Sig Dispense Refill  . acetaminophen (TYLENOL) 500 MG tablet Take 1,000 mg by mouth every 6 (six) hours as needed for pain.      . Ascorbic Acid (VITAMIN C) 100 MG CHEW Chew 2 tablets by mouth daily.      Marland Kitchen BIOTIN PO Take 1 tablet by mouth daily.       . calcium carbonate (TUMS - DOSED IN MG ELEMENTAL CALCIUM) 500 MG chewable tablet Chew 3 tablets by mouth daily as needed for indigestion or heartburn.      . Cholecalciferol (VITAMIN D3) 2000 UNITS TABS Take 1  tablet by mouth daily.      . clobetasol ointment (TEMOVATE) 7.56 % Apply 1 application topically 2 (two) times daily. Apply to affected area  30 g  2  . clonazePAM (KLONOPIN) 0.5 MG tablet Take 0.5-1 tablets (0.25-0.5 mg total) by mouth 3 (three) times daily as needed for anxiety.  90 tablet  2  . docusate sodium (COLACE) 100 MG capsule Take 100 mg by mouth 2 (two) times daily.      . ferrous fumarate (HEMOCYTE - 106 MG FE) 325 (106 FE) MG TABS tablet Take 1 tablet by mouth.      . Loratadine (CLARITIN PO) Take 1 tablet by mouth daily.      . Multiple Vitamins-Minerals (ZINC PO) Take by mouth daily.      Marland Kitchen OVER THE COUNTER MEDICATION Take 1 tablet by mouth daily. Potassium, magnesium, calcium combination.      . Phenazopyridine HCl (AZO-STANDARD PO) Take 2 tablets by mouth as needed. Urinary discomfort      . polyethylene glycol (MIRALAX / GLYCOLAX) packet Take 17 g by mouth daily as needed. constipation      . potassium chloride (K-DUR) 10 MEQ tablet Take 1 tablet (10 mEq total) by mouth daily.  30 tablet  11  . Prenatal Vit-Fe Fumarate-FA (PRENATAL MULTIVITAMIN) TABS Take 1 tablet by mouth at bedtime.       . promethazine (PHENERGAN) 25 MG tablet Take 1 tablet (25 mg total) by mouth every 6 (six) hours as needed for nausea.  22 tablet  3  . PSEUDOEPHEDRINE HCL PO Take 1 tablet by mouth as needed (allergies).        Review of Systems  Constitutional: Negative.   HENT: Negative.   Eyes: Negative.   Respiratory: Positive for shortness of breath.   Cardiovascular: Negative.   Gastrointestinal: Positive for abdominal pain.       More cramping than pain  Genitourinary: Negative.   Musculoskeletal: Negative.   Skin: Negative.   Neurological: Negative.   Psychiatric/Behavioral: The patient is nervous/anxious.    Physical Exam   Blood pressure 120/71, pulse 87, temperature 97.7 F (36.5 C), temperature source Oral, resp. rate 18, height $RemoveBe'5\' 6"'WzuvMfdQa$  (1.676 m), weight 210 lb 3.2 oz (95.346 kg),  last menstrual period 11/19/2013, SpO2 100.00%.  Physical Exam  Constitutional: She is oriented to person, place, and time. She appears well-developed and well-nourished. No distress.  HENT:  Head: Normocephalic and atraumatic.  Eyes: Pupils are equal,  round, and reactive to light.  Cardiovascular: Normal rate, regular rhythm and normal heart sounds.   Respiratory: Effort normal and breath sounds normal. No respiratory distress.  GI: Soft. Bowel sounds are normal. She exhibits no distension and no mass. There is no tenderness. There is no rebound and no guarding.  Genitourinary:  Genital:External negative other than some redness of labia Vaginal:thick white vaginal discharge. No bleeding at all Cervix:closed/ thick Bimanual:nontender   Musculoskeletal: Normal range of motion.  Neurological: She is alert and oriented to person, place, and time.  Skin: Skin is warm.  Psychiatric: Her mood appears anxious.   Results for orders placed during the hospital encounter of 03/16/14 (from the past 24 hour(s))  URINALYSIS, ROUTINE W REFLEX MICROSCOPIC     Status: Abnormal   Collection Time    03/16/14  9:10 PM      Result Value Ref Range   Color, Urine YELLOW  YELLOW   APPearance CLEAR  CLEAR   Specific Gravity, Urine <1.005 (*) 1.005 - 1.030   pH 6.0  5.0 - 8.0   Glucose, UA NEGATIVE  NEGATIVE mg/dL   Hgb urine dipstick NEGATIVE  NEGATIVE   Bilirubin Urine NEGATIVE  NEGATIVE   Ketones, ur NEGATIVE  NEGATIVE mg/dL   Protein, ur NEGATIVE  NEGATIVE mg/dL   Urobilinogen, UA 0.2  0.0 - 1.0 mg/dL   Nitrite NEGATIVE  NEGATIVE   Leukocytes, UA NEGATIVE  NEGATIVE  WET PREP, GENITAL     Status: Abnormal   Collection Time    03/16/14  9:40 PM      Result Value Ref Range   Yeast Wet Prep HPF POC FEW (*) NONE SEEN   Trich, Wet Prep NONE SEEN  NONE SEEN   Clue Cells Wet Prep HPF POC NONE SEEN  NONE SEEN   WBC, Wet Prep HPF POC FEW (*) NONE SEEN     MAU Course  Procedures  MDM  Wet  prep  Assessment and Plan   A: Abdominal cramping in pregnancy Vaginal Yeast  P: Advised to drink fluids/ rest/ follow up with MD Diflucan 150 mg po / refill x1 Return to MAU as needed  Olegario Messier 03/16/2014, 10:01 PM

## 2014-03-16 NOTE — Progress Notes (Signed)
Wet prep only collected and sent.

## 2014-03-16 NOTE — Discharge Instructions (Signed)
Abdominal Pain During Pregnancy Abdominal pain is common in pregnancy. Most of the time, it does not cause harm. There are many causes of abdominal pain. Some causes are more serious than others. Some of the causes of abdominal pain in pregnancy are easily diagnosed. Occasionally, the diagnosis takes time to understand. Other times, the cause is not determined. Abdominal pain can be a sign that something is very wrong with the pregnancy, or the pain may have nothing to do with the pregnancy at all. For this reason, always tell your health care provider if you have any abdominal discomfort. HOME CARE INSTRUCTIONS  Monitor your abdominal pain for any changes. The following actions may help to alleviate any discomfort you are experiencing:  Do not have sexual intercourse or put anything in your vagina until your symptoms go away completely.  Get plenty of rest until your pain improves.  Drink clear fluids if you feel nauseous. Avoid solid food as long as you are uncomfortable or nauseous.  Only take over-the-counter or prescription medicine as directed by your health care provider.  Keep all follow-up appointments with your health care provider. SEEK IMMEDIATE MEDICAL CARE IF:  You are bleeding, leaking fluid, or passing tissue from the vagina.  You have increasing pain or cramping.  You have persistent vomiting.  You have painful or bloody urination.  You have a fever.  You notice a decrease in your baby's movements.  You have extreme weakness or feel faint.  You have shortness of breath, with or without abdominal pain.  You develop a severe headache with abdominal pain.  You have abnormal vaginal discharge with abdominal pain.  You have persistent diarrhea.  You have abdominal pain that continues even after rest, or gets worse. MAKE SURE YOU:   Understand these instructions.  Will watch your condition.  Will get help right away if you are not doing well or get  worse. Document Released: 11/22/2005 Document Revised: 09/12/2013 Document Reviewed: 06/21/2013 [facility] Patient Information 2014 [place], [place]. Monilial Vaginitis Vaginitis in a soreness, swelling and redness (inflammation) of the vagina and vulva. Monilial vaginitis is not a sexually transmitted infection. CAUSES  Yeast vaginitis is caused by yeast (candida) that is normally found in your vagina. With a yeast infection, the candida has overgrown in number to a point that upsets the chemical balance. SYMPTOMS   White, thick vaginal discharge.  Swelling, itching, redness and irritation of the vagina and possibly the lips of the vagina (vulva).  Burning or painful urination.  Painful intercourse. DIAGNOSIS  Things that may contribute to monilial vaginitis are:  Postmenopausal and virginal states.  Pregnancy.  Infections.  Being tired, sick or stressed, especially if you had monilial vaginitis in the past.  Diabetes. Good control will help lower the chance.  Birth control pills.  Tight fitting garments.  Using bubble bath, feminine sprays, douches or deodorant tampons.  Taking certain medications that kill germs (antibiotics).  Sporadic recurrence can occur if you become ill. TREATMENT  Your caregiver will give you medication.  There are several kinds of anti monilial vaginal creams and suppositories specific for monilial vaginitis. For recurrent yeast infections, use a suppository or cream in the vagina 2 times a week, or as directed.  Anti-monilial or steroid cream for the itching or irritation of the vulva may also be used. Get your caregiver's permission.  Painting the vagina with methylene blue solution may help if the monilial cream does not work.  Eating yogurt may help prevent monilial vaginitis. HOME  CARE INSTRUCTIONS   Finish all medication as prescribed.  Do not have sex until treatment is completed or after your caregiver tells you it is okay.  Take  warm sitz baths.  Do not douche.  Do not use tampons, especially scented ones.  Wear cotton underwear.  Avoid tight pants and panty hose.  Tell your sexual partner that you have a yeast infection. They should go to their caregiver if they have symptoms such as mild rash or itching.  Your sexual partner should be treated as well if your infection is difficult to eliminate.  Practice safer sex. Use condoms.  Some vaginal medications cause latex condoms to fail. Vaginal medications that harm condoms are:  Cleocin cream.  Butoconazole (Femstat).  Terconazole (Terazol) vaginal suppository.  Miconazole (Monistat) (may be purchased over the counter). SEEK MEDICAL CARE IF:   You have a temperature by mouth above 102 F (38.9 C).  The infection is getting worse after 2 days of treatment.  The infection is not getting better after 3 days of treatment.  You develop blisters in or around your vagina.  You develop vaginal bleeding, and it is not your menstrual period.  You have pain when you urinate.  You develop intestinal problems.  You have pain with sexual intercourse. Document Released: 09/01/2005 Document Revised: 02/14/2012 Document Reviewed: 05/16/2009 [facility] Patient Information 2014 [place], [place].

## 2014-03-17 NOTE — MAU Provider Note (Signed)

## 2014-03-18 ENCOUNTER — Encounter: Payer: 59 | Admitting: Obstetrics and Gynecology

## 2014-03-22 ENCOUNTER — Encounter: Payer: Self-pay | Admitting: Family Medicine

## 2014-03-22 ENCOUNTER — Ambulatory Visit (INDEPENDENT_AMBULATORY_CARE_PROVIDER_SITE_OTHER): Payer: Medicaid Other | Admitting: Family Medicine

## 2014-03-22 VITALS — BP 110/70 | Ht 66.0 in | Wt 211.2 lb

## 2014-03-22 DIAGNOSIS — L0291 Cutaneous abscess, unspecified: Secondary | ICD-10-CM

## 2014-03-22 DIAGNOSIS — L0390 Cellulitis, unspecified: Secondary | ICD-10-CM

## 2014-03-22 DIAGNOSIS — B070 Plantar wart: Secondary | ICD-10-CM

## 2014-03-22 MED ORDER — CEPHALEXIN 500 MG PO CAPS: 500.0000 mg | ORAL_CAPSULE | Freq: Four times a day (QID) | ORAL | Status: AC

## 2014-03-22 NOTE — Progress Notes (Signed)
   Subjective:    Patient ID: Penny [name], female    DOB: 1986-05-09, 28 y.o.   MRN: [id number]  HPI Patient is here today for plantar warts on her left foot. Patient states that her left foot is very painful and it radiates up to her left knee. Patient has tried Freeze Away spray OTC without any relief. Patient also states that she was told to notify her PCP she has a hole in her heart and is wearing a montior for this.    Review of Systems She denies fever chills sweats.    Objective:   Physical Exam On physical exam she is 2 large plantar warts with associated low but the redness run of cellulitis nothing no sign of any type of abscess or any other underlying serious problem I recommend topical salicylic acid       Assessment & Plan:  Pregnancy-under the care of GYN Cellulitis minimal Keflex as directed Plantar warts recommended dermatology patient can't afford currently use salicylic acid 45% in petroleum jelly apply as directed couple times per day for the next month if she would like to be set up with dermatology she can notify us

## 2014-03-25 ENCOUNTER — Other Ambulatory Visit: Payer: Self-pay | Admitting: Family Medicine

## 2014-03-25 ENCOUNTER — Telehealth: Payer: Self-pay | Admitting: Obstetrics & Gynecology

## 2014-03-25 DIAGNOSIS — G8929 Other chronic pain: Secondary | ICD-10-CM | POA: Insufficient documentation

## 2014-03-25 DIAGNOSIS — R0989 Other specified symptoms and signs involving the circulatory and respiratory systems: Secondary | ICD-10-CM | POA: Insufficient documentation

## 2014-03-25 DIAGNOSIS — Z8679 Personal history of other diseases of the circulatory system: Secondary | ICD-10-CM | POA: Insufficient documentation

## 2014-03-25 DIAGNOSIS — O99019 Anemia complicating pregnancy, unspecified trimester: Secondary | ICD-10-CM | POA: Insufficient documentation

## 2014-03-25 DIAGNOSIS — Z87891 Personal history of nicotine dependence: Secondary | ICD-10-CM | POA: Insufficient documentation

## 2014-03-25 DIAGNOSIS — Z91040 Latex allergy status: Secondary | ICD-10-CM | POA: Insufficient documentation

## 2014-03-25 DIAGNOSIS — Z9889 Other specified postprocedural states: Secondary | ICD-10-CM | POA: Insufficient documentation

## 2014-03-25 DIAGNOSIS — Z79899 Other long term (current) drug therapy: Secondary | ICD-10-CM | POA: Insufficient documentation

## 2014-03-25 DIAGNOSIS — D509 Iron deficiency anemia, unspecified: Secondary | ICD-10-CM | POA: Insufficient documentation

## 2014-03-25 DIAGNOSIS — Z8659 Personal history of other mental and behavioral disorders: Secondary | ICD-10-CM | POA: Insufficient documentation

## 2014-03-25 DIAGNOSIS — O9989 Other specified diseases and conditions complicating pregnancy, childbirth and the puerperium: Secondary | ICD-10-CM | POA: Insufficient documentation

## 2014-03-25 DIAGNOSIS — Z8742 Personal history of other diseases of the female genital tract: Secondary | ICD-10-CM | POA: Insufficient documentation

## 2014-03-25 DIAGNOSIS — Z8719 Personal history of other diseases of the digestive system: Secondary | ICD-10-CM | POA: Insufficient documentation

## 2014-03-25 DIAGNOSIS — R0609 Other forms of dyspnea: Secondary | ICD-10-CM | POA: Insufficient documentation

## 2014-03-25 DIAGNOSIS — Z9089 Acquired absence of other organs: Secondary | ICD-10-CM | POA: Insufficient documentation

## 2014-03-25 DIAGNOSIS — R109 Unspecified abdominal pain: Secondary | ICD-10-CM | POA: Insufficient documentation

## 2014-03-25 DIAGNOSIS — R42 Dizziness and giddiness: Secondary | ICD-10-CM | POA: Insufficient documentation

## 2014-03-25 NOTE — Telephone Encounter (Signed)
I looked at pt's chart and it looks like she has refills on Phenergan. Advised to call [facility] and ask them to fill med. Pt voiced understanding. [place]

## 2014-03-26 ENCOUNTER — Encounter (HOSPITAL_COMMUNITY): Payer: Self-pay | Admitting: Emergency Medicine

## 2014-03-26 ENCOUNTER — Emergency Department (HOSPITAL_COMMUNITY)
Admission: EM | Admit: 2014-03-26 | Discharge: 2014-03-26 | Disposition: A | Discharge status: Home / Self Care | Payer: Medicaid Other | Attending: Emergency Medicine | Admitting: Emergency Medicine

## 2014-03-26 DIAGNOSIS — R0600 Dyspnea, unspecified: Secondary | ICD-10-CM

## 2014-03-26 DIAGNOSIS — O26899 Other specified pregnancy related conditions, unspecified trimester: Secondary | ICD-10-CM

## 2014-03-26 DIAGNOSIS — R42 Dizziness and giddiness: Secondary | ICD-10-CM

## 2014-03-26 DIAGNOSIS — R109 Unspecified abdominal pain: Secondary | ICD-10-CM

## 2014-03-26 LAB — CBC WITH DIFFERENTIAL/PLATELET
Basophils Absolute: 0 10*3/uL (ref 0.0–0.1)
Basophils Relative: 0 % (ref 0–1)
EOS PCT: 2 % (ref 0–5)
Eosinophils Absolute: 0.1 10*3/uL (ref 0.0–0.7)
HCT: 31 % — ABNORMAL LOW (ref 36.0–46.0)
Hemoglobin: 10.5 g/dL — ABNORMAL LOW (ref 12.0–15.0)
LYMPHS PCT: 35 % (ref 12–46)
Lymphs Abs: 2.6 10*3/uL (ref 0.7–4.0)
MCH: 30.8 pg (ref 26.0–34.0)
MCHC: 33.9 g/dL (ref 30.0–36.0)
MCV: 90.9 fL (ref 78.0–100.0)
MONO ABS: 0.8 10*3/uL (ref 0.1–1.0)
MONOS PCT: 11 % (ref 3–12)
Neutro Abs: 3.9 10*3/uL (ref 1.7–7.7)
Neutrophils Relative %: 52 % (ref 43–77)
Platelets: 168 10*3/uL (ref 150–400)
RBC: 3.41 MIL/uL — AB (ref 3.87–5.11)
RDW: 19 % — ABNORMAL HIGH (ref 11.5–15.5)
WBC: 7.4 10*3/uL (ref 4.0–10.5)

## 2014-03-26 LAB — COMPREHENSIVE METABOLIC PANEL
ALT: 21 U/L (ref 0–35)
AST: 16 U/L (ref 0–37)
Albumin: 3.4 g/dL — ABNORMAL LOW (ref 3.5–5.2)
Alkaline Phosphatase: 52 U/L (ref 39–117)
BUN: 12 mg/dL (ref 6–23)
CALCIUM: 8.9 mg/dL (ref 8.4–10.5)
CO2: 24 mEq/L (ref 19–32)
Chloride: 101 mEq/L (ref 96–112)
Creatinine, Ser: 0.49 mg/dL — ABNORMAL LOW (ref 0.50–1.10)
GFR calc non Af Amer: >90 mL/min (ref >90)
Glucose, Bld: 90 mg/dL (ref 70–99)
Potassium: 3.4 mEq/L — ABNORMAL LOW (ref 3.7–5.3)
Sodium: 137 mEq/L (ref 137–147)
Total Bilirubin: 0.4 mg/dL (ref 0.3–1.2)
Total Protein: 6.5 g/dL (ref 6.0–8.3)

## 2014-03-26 LAB — URINALYSIS, ROUTINE W REFLEX MICROSCOPIC
Bilirubin Urine: NEGATIVE
GLUCOSE, UA: NEGATIVE mg/dL
Hgb urine dipstick: NEGATIVE
Ketones, ur: NEGATIVE mg/dL
Leukocytes, UA: NEGATIVE
Nitrite: NEGATIVE
Protein, ur: NEGATIVE mg/dL
Specific Gravity, Urine: 1.02 (ref 1.005–1.030)
Urobilinogen, UA: 1 mg/dL (ref 0.0–1.0)
pH: 7 (ref 5.0–8.0)

## 2014-03-26 MED ORDER — ONDANSETRON HCL 4 MG/2ML IJ SOLN: 4.0000 mg | Freq: Once | INTRAMUSCULAR | Status: DC

## 2014-03-26 MED ORDER — PROMETHAZINE HCL 25 MG/ML IJ SOLN
25.0000 mg | Freq: Four times a day (QID) | INTRAMUSCULAR | Status: DC | PRN
  Administered 2014-03-26: 25 mg via INTRAVENOUS
  Filled 2014-03-26: qty 1

## 2014-03-26 MED ORDER — MECLIZINE HCL 12.5 MG PO TABS
25.0000 mg | ORAL_TABLET | Freq: Once | ORAL | Status: AC
  Administered 2014-03-26: 25 mg via ORAL
  Filled 2014-03-26: qty 2

## 2014-03-26 MED ORDER — ALBUTEROL SULFATE (2.5 MG/3ML) 0.083% IN NEBU: 2.5000 mg | INHALATION_SOLUTION | Freq: Once | RESPIRATORY_TRACT | Status: DC

## 2014-03-26 MED ORDER — SODIUM CHLORIDE 0.9 % IV SOLN
1000.0000 mL | INTRAVENOUS | Status: DC
  Administered 2014-03-26: 1000 mL via INTRAVENOUS

## 2014-03-26 MED ORDER — MECLIZINE HCL 25 MG PO TABS: 25.0000 mg | ORAL_TABLET | Freq: Three times a day (TID) | ORAL | Status: DC | PRN

## 2014-03-26 MED ORDER — SODIUM CHLORIDE 0.9 % IV SOLN
1000.0000 mL | Freq: Once | INTRAVENOUS | Status: AC
  Administered 2014-03-26: 1000 mL via INTRAVENOUS

## 2014-03-26 NOTE — ED Notes (Signed)
Summoned to room by patient. States "I don't want that breathing treatment he is ordering for me." Advised MD.

## 2014-03-26 NOTE — Discharge Instructions (Signed)
Abdominal Pain During Pregnancy Abdominal pain is common in pregnancy. Most of the time, it does not cause harm. There are many causes of abdominal pain. Some causes are more serious than others. Some of the causes of abdominal pain in pregnancy are easily diagnosed. Occasionally, the diagnosis takes time to understand. Other times, the cause is not determined. Abdominal pain can be a sign that something is very wrong with the pregnancy, or the pain may have nothing to do with the pregnancy at all. For this reason, always tell your health care provider if you have any abdominal discomfort. HOME CARE INSTRUCTIONS  Monitor your abdominal pain for any changes. The following actions may help to alleviate any discomfort you are experiencing:  Do not have sexual intercourse or put anything in your vagina until your symptoms go away completely.  Get plenty of rest until your pain improves.  Drink clear fluids if you feel nauseous. Avoid solid food as long as you are uncomfortable or nauseous.  Only take over-the-counter or prescription medicine as directed by your health care provider.  Keep all follow-up appointments with your health care provider. SEEK IMMEDIATE MEDICAL CARE IF:  You are bleeding, leaking fluid, or passing tissue from the vagina.  You have increasing pain or cramping.  You have persistent vomiting.  You have painful or bloody urination.  You have a fever.  You notice a decrease in your baby's movements.  You have extreme weakness or feel faint.  You have shortness of breath, with or without abdominal pain.  You develop a severe headache with abdominal pain.  You have abnormal vaginal discharge with abdominal pain.  You have persistent diarrhea.  You have abdominal pain that continues even after rest, or gets worse. MAKE SURE YOU:   Understand these instructions.  Will watch your condition.  Will get help right away if you are not doing well or get  worse. Document Released: 11/22/2005 Document Revised: 09/12/2013 Document Reviewed: 06/21/2013 [facility] Patient Information 2014 [place], [place].  Shortness of Breath Shortness of breath means you have trouble breathing. Shortness of breath may indicate that you have a medical problem. You should seek immediate medical care for shortness of breath. CAUSES   Not enough oxygen in the air (as with high altitudes or a smoke-filled room).  Short-term (acute) lung disease, including:  Infections, such as pneumonia.  Fluid in the lungs, such as heart failure.  A blood clot in the lungs (pulmonary embolism).  Long-term (chronic) lung diseases.  Heart disease (heart attack, angina, heart failure, and others).  Low red blood cells (anemia).  Poor physical fitness. This can cause shortness of breath when you exercise.  Chest or back injuries or stiffness.  Being overweight.  Smoking.  Anxiety. This can make you feel like you are not getting enough air. DIAGNOSIS  Serious medical problems can usually be found during your physical exam. Tests may also be done to determine why you are having shortness of breath. Tests may include:  Chest X-rays.  Lung function tests.  Blood tests.  Electrocardiography.  Exercise testing.  Echocardiography.  Imaging scans. Your caregiver may not be able to find a cause for your shortness of breath after your exam. In this case, it is important to have a follow-up exam with your caregiver as directed.  TREATMENT  Treatment for shortness of breath depends on the cause of your symptoms and can vary greatly. HOME CARE INSTRUCTIONS   Do not smoke. Smoking is a common cause of shortness  of breath. If you smoke, ask for help to quit.  Avoid being around chemicals or things that may bother your breathing, such as paint fumes and dust.  Rest as needed. Slowly resume your usual activities.  If medicines were prescribed, take them as directed for  the full length of time directed. This includes oxygen and any inhaled medicines.  Keep all follow-up appointments as directed by your caregiver. SEEK MEDICAL CARE IF:   Your condition does not improve in the time expected.  You have a hard time doing your normal activities even with rest.  You have any side effects or problems with the medicines prescribed.  You develop any new symptoms. SEEK IMMEDIATE MEDICAL CARE IF:   Your shortness of breath gets worse.  You feel lightheaded, faint, or develop a cough not controlled with medicines.  You start coughing up blood.  You have pain with breathing.  You have chest pain or pain in your arms, shoulders, or abdomen.  You have a fever.  You are unable to walk up stairs or exercise the way you normally do. MAKE SURE YOU:  Understand these instructions.  Will watch your condition.  Will get help right away if you are not doing well or get worse. Document Released: 08/17/2001 Document Revised: 05/23/2012 Document Reviewed: 02/07/2012 [facility] Patient Information 2014 [place].  Vertigo Vertigo means you feel like you or your surroundings are moving when they are not. Vertigo can be dangerous if it occurs when you are at work, driving, or performing difficult activities.  CAUSES  Vertigo occurs when there is a conflict of signals sent to your brain from the visual and sensory systems in your body. There are many different causes of vertigo, including:  Infections, especially in the inner ear.  A bad reaction to a drug or misuse of alcohol and medicines.  Withdrawal from drugs or alcohol.  Rapidly changing positions, such as lying down or rolling over in bed.  A migraine headache.  Decreased blood flow to the brain.  Increased pressure in the brain from a head injury, infection, tumor, or bleeding. SYMPTOMS  You may feel as though the world is spinning around or you are falling to the ground. Because your balance  is upset, vertigo can cause nausea and vomiting. You may have involuntary eye movements (nystagmus). DIAGNOSIS  Vertigo is usually diagnosed by physical exam. If the cause of your vertigo is unknown, your caregiver may perform imaging tests, such as an MRI scan (magnetic resonance imaging). TREATMENT  Most cases of vertigo resolve on their own, without treatment. Depending on the cause, your caregiver may prescribe certain medicines. If your vertigo is related to body position issues, your caregiver may recommend movements or procedures to correct the problem. In rare cases, if your vertigo is caused by certain inner ear problems, you may need surgery. HOME CARE INSTRUCTIONS   Follow your caregiver's instructions.  Avoid driving.  Avoid operating heavy machinery.  Avoid performing any tasks that would be dangerous to you or others during a vertigo episode.  Tell your caregiver if you notice that certain medicines seem to be causing your vertigo. Some of the medicines used to treat vertigo episodes can actually make them worse in some people. SEEK IMMEDIATE MEDICAL CARE IF:   Your medicines do not relieve your vertigo or are making it worse.  You develop problems with talking, walking, weakness, or using your arms, hands, or legs.  You develop severe headaches.  Your nausea or vomiting  continues or gets worse.  You develop visual changes.  A family member notices behavioral changes.  Your condition gets worse. MAKE SURE YOU:  Understand these instructions.  Will watch your condition.  Will get help right away if you are not doing well or get worse. Document Released: 09/01/2005 Document Revised: 02/14/2012 Document Reviewed: 06/10/2011 [facility] Patient Information 2014 [place].  Meclizine tablets or capsules What is this medicine? MECLIZINE (MEK li zeen) is an antihistamine. It is used to prevent nausea, vomiting, or dizziness caused by motion sickness. It is also  used to prevent and treat vertigo (extreme dizziness or a feeling that you or your surroundings are tilting or spinning around). This medicine may be used for other purposes; ask your health care provider or pharmacist if you have questions. COMMON BRAND NAME(S): Antivert, Dramamine Less Drowsy, Medivert, Meni-D  What should I tell my health care provider before I take this medicine? They need to know if you have any of these conditions: -asthma -glaucoma -prostate trouble -stomach problems -urinary problems -an unusual or allergic reaction to meclizine, other medicines, foods, dyes, or preservatives -pregnant or trying to get pregnant -breast-feeding How should I use this medicine? Take this medicine by mouth with a glass of water. Follow the directions on the prescription label. If you are using this medicine to prevent motion sickness, take the dose at least 1 hour before travel. If it upsets your stomach, take it with food or milk. Take your doses at regular intervals. Do not take your medicine more often than directed. Talk to your pediatrician regarding the use of this medicine in children. Special care may be needed. Overdosage: If you think you have taken too much of this medicine contact a poison control center or emergency room at once. NOTE: This medicine is only for you. Do not share this medicine with others. What if I miss a dose? If you miss a dose, take it as soon as you can. If it is almost time for your next dose, take only that dose. Do not take double or extra doses. What may interact with this medicine? -barbiturate medicines for inducing sleep or treating seizures -digoxin -medicines for anxiety or sleeping problems, like alprazolam, diazepam or temazepam -medicines for hay fever and other allergies -medicines for mental depression -medicines for movement abnormalities as in Parkinson's disease, or for stomach problems -medicines for pain -medicines that relax  muscles This list may not describe all possible interactions. Give your health care provider a list of all the medicines, herbs, non-prescription drugs, or dietary supplements you use. Also tell them if you smoke, drink alcohol, or use illegal drugs. Some items may interact with your medicine. What should I watch for while using this medicine? If you are taking this medicine on a regular schedule, visit your doctor or health care professional for regular checks on your progress. You may get dizzy, drowsy or have blurred vision. Do not drive, use machinery, or do anything that needs mental alertness until you know how this medicine affects you. Do not stand or sit up quickly, especially if you are an older patient. This reduces the risk of dizzy or fainting spells. Alcohol can increase possible dizziness. Avoid alcoholic drinks. Your mouth may get dry. Chewing sugarless gum or sucking hard candy, and drinking plenty of water may help. Contact your doctor if the problem does not go away or is severe. This medicine may cause dry eyes and blurred vision. If you wear contact lenses you may  feel some discomfort. Lubricating drops may help. See your eye doctor if the problem does not go away or is severe. What side effects may I notice from receiving this medicine? Side effects that you should report to your doctor or health care professional as soon as possible: -fainting spells -fast or irregular heartbeat Side effects that usually do not require medical attention (report to your doctor or health care professional if they continue or are bothersome): -constipation -difficulty passing urine -difficulty sleeping -headache -stomach upset This list may not describe all possible side effects. Call your doctor for medical advice about side effects. You may report side effects to FDA at 1-800-FDA-1088. Where should I keep my medicine? Keep out of the reach of children. Store at room temperature between 15  and 30 degrees C (59 and 86 degrees F). Keep container tightly closed. Throw away any unused medicine after the expiration date. NOTE: This sheet is a summary. It may not cover all possible information. If you have questions about this medicine, talk to your doctor, pharmacist, or health care provider.  2014, [employer]. (2008-05-30 10:35:36)

## 2014-03-26 NOTE — ED Notes (Signed)
Pt reporting abdominal cramping for about past 90 minutes.  Denies any bleeding.  Reports being [redacted] weeks pregnant.

## 2014-03-26 NOTE — Telephone Encounter (Signed)
I believe this patient is pregnant. She needs to get this prescription from her OB/GYN

## 2014-03-26 NOTE — ED Notes (Signed)
Patient requesting to be discharged. States "I want to leave. Can you tell my doctor I want to be discharged?" Advised MD.

## 2014-03-26 NOTE — ED Provider Notes (Signed)
CSN: [id number]     Arrival date & time 03/25/14  2349 History   First MD Initiated Contact with Patient 03/26/14 0229     Chief Complaint  Patient presents with  . Abdominal Cramping     (Consider location/radiation/quality/duration/timing/severity/associated sxs/prior Treatment) Patient is a 28 y.o. female presenting with cramps. The history is provided by the patient.  Abdominal Cramping She is [redacted] weeks pregnant. This evening, at about 10 PM, she noted that she was having trouble breathing and felt lightheaded. There was some numbness of her hands and circumoral numbness and dry mouth. At about the same time, she developed cramping in the right side of her abdomen. Pain was moderate to severe and she rated it at 7/10. Currently, cramping has eased up and is down to 5/10. There has been some vague chest pain associated with this. She has been evaluated for chest pain in the past and it has been felt to be noncardiac. She also is wearing an event monitor because of a history of palpitations. Regarding her cramping, nothing seems to make it better and nothing seems to make it worse. However, her sense of dyspnea does wax and wane and when she is more dyspneic, she has more abdominal cramping. She has had nausea all day but no vomiting. There's been no constipation. She denies any dysuria and denies any vaginal bleeding. She did take acetaminophen and ibuprofen with no significant relief of pain.  Past Medical History  Diagnosis Date  . Migraines   . Interstitial cystitis   . IUD FEB 2010  . Gastritis JULY 2011  . Depression   . 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 Fatty liver   . Obesity (BMI 30-39.9) 2011 228 LBS BMI 36.8  . Iron deficiency anemia 07/23/2010  . Irritable bowel syndrome 2012 DIARRHEA    JUN 2012 TTG IgA 14.9  . Elevated liver enzymes JUL 2011ALK PHOS 111-127 AST  143-267 ALT  213-321T BILI 0.6  ALB  3.7-4.06 Jun 2011 ALK PHOS 118 AST 24 ALT 42 T BILI 0.4 ALB 3.9  .  Chronic daily headache   . Ovarian cyst   . PONV (postoperative nausea and vomiting)   . ADD (attention deficit disorder)   . RLQ abdominal pain 07/18/2013  . Menorrhagia 07/18/2013  . Anxiety   . Potassium (K) deficiency   . Dysmenorrhea 09/18/2013  . Stress 09/18/2013  . Patient desires pregnancy 09/18/2013  . Pregnant 12/25/2013   Past Surgical History  Procedure Laterality Date  . Gab  2007    in [place]-POUCH 5 CM  . Cholecystectomy  2005    biliary dyskinesia  . Cath self every nite      for sodium bicarb injection (discontinued 2013)  . Colonoscopy  JUN 2012 ABD PN/DIARRHEA WITH PROPOFOL    NL COLON  . Upper gastrointestinal endoscopy  JULY 2011 NAUSEA-D125,V6, PH 25    Bx; GASTRITIS, POUCH-5 CM LONG  . Dilation and curettage of uterus    . Tonsillectomy    . Gastric bypass  06/2006  . Savory dilation  06/20/2012    IPJ:ASNK gastritis/Ulcer in the mid jejunum  . Tonsillectomy and adenoidectomy    . Esophagogastroduodenoscopy     Family History  Problem Relation Age of Onset  . Hemochromatosis Maternal Grandmother   . Migraines Maternal Grandmother   . Cancer Maternal Grandmother   . Hypertension Father   . Diabetes Father   . Coronary artery  disease Father   . Migraines Paternal Grandmother   . Cancer Mother     breast  . Hemochromatosis Mother   . Coronary artery disease Paternal Grandfather    History  Substance Use Topics  . Smoking status: Former Smoker -- 5 years    Types: Cigarettes  . Smokeless tobacco: Never Used  . Alcohol Use: No   OB History   Grav Para Term Preterm Abortions TAB SAB Ect Mult Living   '3 1 1  1  1   1     '$ Review of Systems  All other systems reviewed and are negative.     Allergies  Gabapentin; Metoclopramide hcl; Topamax; Codeine; Latex; and Tape  Home Medications   Prior to Admission medications   Medication Sig Start Date End Date Taking? Authorizing Provider  acetaminophen (TYLENOL) 500 MG tablet Take 1,000  mg by mouth every 6 (six) hours as needed for pain.    Historical Provider, MD  Ascorbic Acid (VITAMIN C) 100 MG CHEW Chew 2 tablets by mouth daily.    Historical Provider, MD  BIOTIN PO Take 1 tablet by mouth daily.     Historical Provider, MD  calcium carbonate (TUMS - DOSED IN MG ELEMENTAL CALCIUM) 500 MG chewable tablet Chew 3 tablets by mouth daily as needed for indigestion or heartburn.    Historical Provider, MD  cephALEXin (KEFLEX) 500 MG capsule Take 1 capsule (500 mg total) by mouth 4 (four) times daily. 03/22/14 04/01/14  Kathyrn Drown, MD  Cholecalciferol (VITAMIN D3) 2000 UNITS TABS Take 1 tablet by mouth daily.    Historical Provider, MD  clobetasol ointment (TEMOVATE) 8.31 % Apply 1 application topically 2 (two) times daily. Apply to affected area 01/16/14   Lattie Haw A Leftwich-Kirby, CNM  docusate sodium (COLACE) 100 MG capsule Take 100 mg by mouth 2 (two) times daily.    Historical Provider, MD  ferrous fumarate (HEMOCYTE - 106 MG FE) 325 (106 FE) MG TABS tablet Take 1 tablet by mouth.    Historical Provider, MD  fluconazole (DIFLUCAN) 150 MG tablet Take 1 tablet (150 mg total) by mouth daily. 03/16/14   Olegario Messier, NP  Loratadine (CLARITIN PO) Take 1 tablet by mouth daily.    Historical Provider, MD  Multiple Vitamins-Minerals (ZINC PO) Take by mouth daily.    Historical Provider, MD  OVER THE COUNTER MEDICATION Take 1 tablet by mouth daily. Potassium, magnesium, calcium combination.    Historical Provider, MD  Phenazopyridine HCl (AZO-STANDARD PO) Take 2 tablets by mouth as needed. Urinary discomfort    Historical Provider, MD  polyethylene glycol (MIRALAX / GLYCOLAX) packet Take 17 g by mouth daily as needed. constipation    Historical Provider, MD  potassium chloride (K-DUR) 10 MEQ tablet Take 1 tablet (10 mEq total) by mouth daily. 01/17/14   Florian Buff, MD  Prenatal Vit-Fe Fumarate-FA (PRENATAL MULTIVITAMIN) TABS Take 1 tablet by mouth at bedtime.     Historical Provider, MD   promethazine (PHENERGAN) 25 MG tablet Take 1 tablet (25 mg total) by mouth every 6 (six) hours as needed for nausea. 08/09/13   Kathyrn Drown, MD  PSEUDOEPHEDRINE HCL PO Take 1 tablet by mouth as needed (allergies).    Historical Provider, MD   BP 115/62  Pulse 78  Temp(Src) 97.6 F (36.4 C) (Oral)  Resp 18  Ht $R'5\' 6"'og$  (1.676 m)  Wt 210 lb (95.255 kg)  BMI 33.91 kg/m2  SpO2 96%  LMP 11/19/2013 Physical Exam  Nursing note and vitals reviewed.  28 year old female, resting comfortably and in no acute distress. Vital signs are normal. Oxygen saturation is 96%, which is normal. Head is normocephalic and atraumatic. PERRLA, EOMI. Oropharynx is clear. Neck is nontender and supple without adenopathy or JVD. Back is nontender and there is no CVA tenderness. Lungs are clear without rales, wheezes, or rhonchi. Chest is nontender. Heart has regular rate and rhythm without murmur. Abdomen is soft, flat, with mild tenderness rather diffusely. There is no rebound or guarding. Fundus is approximately 16-18 week size, freely movable, and nontender. There are no other masses or hepatosplenomegaly and peristalsis is normoactive. Extremities have no cyanosis or edema, full range of motion is present. Skin is warm and dry without rash. Neurologic: Mental status is normal, cranial nerves are intact, there are no motor or sensory deficits.  ED Course  Procedures (including critical care time) Labs Review Results for orders placed during the hospital encounter of 03/26/14  URINALYSIS, ROUTINE W REFLEX MICROSCOPIC      Result Value Ref Range   Color, Urine YELLOW  YELLOW   APPearance CLEAR  CLEAR   Specific Gravity, Urine 1.020  1.005 - 1.030   pH 7.0  5.0 - 8.0   Glucose, UA NEGATIVE  NEGATIVE mg/dL   Hgb urine dipstick NEGATIVE  NEGATIVE   Bilirubin Urine NEGATIVE  NEGATIVE   Ketones, ur NEGATIVE  NEGATIVE mg/dL   Protein, ur NEGATIVE  NEGATIVE mg/dL   Urobilinogen, UA 1.0  0.0 - 1.0 mg/dL    Nitrite NEGATIVE  NEGATIVE   Leukocytes, UA NEGATIVE  NEGATIVE  CBC WITH DIFFERENTIAL      Result Value Ref Range   WBC 7.4  4.0 - 10.5 K/uL   RBC 3.41 (*) 3.87 - 5.11 MIL/uL   Hemoglobin 10.5 (*) 12.0 - 15.0 g/dL   HCT 13.4 (*) 82.7 - 79.8 %   MCV 90.9  78.0 - 100.0 fL   MCH 30.8  26.0 - 34.0 pg   MCHC 33.9  30.0 - 36.0 g/dL   RDW 59.2 (*) 10.5 - 43.2 %   Platelets 168  150 - 400 K/uL   Neutrophils Relative % 52  43 - 77 %   Neutro Abs 3.9  1.7 - 7.7 K/uL   Lymphocytes Relative 35  12 - 46 %   Lymphs Abs 2.6  0.7 - 4.0 K/uL   Monocytes Relative 11  3 - 12 %   Monocytes Absolute 0.8  0.1 - 1.0 K/uL   Eosinophils Relative 2  0 - 5 %   Eosinophils Absolute 0.1  0.0 - 0.7 K/uL   Basophils Relative 0  0 - 1 %   Basophils Absolute 0.0  0.0 - 0.1 K/uL  COMPREHENSIVE METABOLIC PANEL      Result Value Ref Range   Sodium 137  137 - 147 mEq/L   Potassium 3.4 (*) 3.7 - 5.3 mEq/L   Chloride 101  96 - 112 mEq/L   CO2 24  19 - 32 mEq/L   Glucose, Bld 90  70 - 99 mg/dL   BUN 12  6 - 23 mg/dL   Creatinine, Ser 0.45 (*) 0.50 - 1.10 mg/dL   Calcium 8.9  8.4 - 21.7 mg/dL   Total Protein 6.5  6.0 - 8.3 g/dL   Albumin 3.4 (*) 3.5 - 5.2 g/dL   AST 16  0 - 37 U/L   ALT 21  0 - 35 U/L   Alkaline Phosphatase 52  39 - 117 U/L   Total Bilirubin 0.4  0.3 - 1.2 mg/dL   GFR calc non Af Amer >90  >90 mL/min   GFR calc Af Amer >90  >90 mL/min   MDM   Final diagnoses:  Abdominal cramping complicating pregnancy  Dyspnea  Vertigo    Abdominal cramping of uncertain cause. Second trimester pregnancy. Dyspnea with associated symptoms suggestive of hyperventilation. Old records are reviewed and she has been treated for anemia and has required transfusions. She has also been evaluated by cardiology for chest pain and palpitations and is currently wearing an event monitor. Patient is currently resting comfortably. Screening labs will be obtained.  Laboratory workup has come back unremarkable. She states  that she is still feeling dizzy. She now explains that her dizziness is in not bothering her when she lays still but is worse with movement. Dizziness is reproduced by head movement and I suspect that she has some problems with peripheral vertigo. This may also explain some of her nausea. She is given a dose of meclizine.  Following meclizine, her dizziness got better but she started feeling dyspneic again. I offered her a trial of albuterol to see if it would help her dyspnea but she declined. At this point, I still feel most of her dyspnea and numbness is related to episodes of hyperventilation. She is discharged with prescription for meclizine and is to followup with her PCP.  Delora Fuel, MD 00/34/96 1164

## 2014-03-28 ENCOUNTER — Other Ambulatory Visit: Payer: Self-pay | Admitting: Family Medicine

## 2014-03-28 ENCOUNTER — Encounter: Payer: Self-pay | Admitting: Obstetrics & Gynecology

## 2014-03-28 ENCOUNTER — Ambulatory Visit (INDEPENDENT_AMBULATORY_CARE_PROVIDER_SITE_OTHER): Payer: Medicaid Other | Admitting: Obstetrics & Gynecology

## 2014-03-28 VITALS — BP 130/80 | Wt 214.0 lb

## 2014-03-28 DIAGNOSIS — O09899 Supervision of other high risk pregnancies, unspecified trimester: Secondary | ICD-10-CM

## 2014-03-28 DIAGNOSIS — D649 Anemia, unspecified: Secondary | ICD-10-CM

## 2014-03-28 DIAGNOSIS — O09299 Supervision of pregnancy with other poor reproductive or obstetric history, unspecified trimester: Secondary | ICD-10-CM

## 2014-03-28 DIAGNOSIS — Z1389 Encounter for screening for other disorder: Secondary | ICD-10-CM

## 2014-03-28 DIAGNOSIS — O0990 Supervision of high risk pregnancy, unspecified, unspecified trimester: Secondary | ICD-10-CM

## 2014-03-28 DIAGNOSIS — O99019 Anemia complicating pregnancy, unspecified trimester: Secondary | ICD-10-CM

## 2014-03-28 DIAGNOSIS — Z331 Pregnant state, incidental: Secondary | ICD-10-CM

## 2014-03-28 LAB — POCT URINALYSIS DIPSTICK
GLUCOSE UA: NEGATIVE
Ketones, UA: NEGATIVE
Leukocytes, UA: NEGATIVE
Nitrite, UA: NEGATIVE
PROTEIN UA: NEGATIVE
RBC UA: NEGATIVE

## 2014-03-28 LAB — POCT HEMOGLOBIN: Hemoglobin: 11.3 g/dL — AB (ref 12.2–16.2)

## 2014-03-28 MED ORDER — PROMETHAZINE HCL 25 MG PO TABS: 25.0000 mg | ORAL_TABLET | Freq: Four times a day (QID) | ORAL | Status: DC | PRN

## 2014-03-28 MED ORDER — BUTALBITAL-APAP-CAFFEINE 50-325-40 MG PO TABS: 1.0000 | ORAL_TABLET | Freq: Four times a day (QID) | ORAL | Status: DC | PRN

## 2014-03-28 NOTE — Progress Notes (Signed)
Pt with right sided headache today with photophobia no neurological symptoms, hemoglobin 11.7  Oxygen therapy provided with improvement from 10 to 7, will give Rx for fioricet  2nd IT today, which will be watched closely due to elevated NT  Follow up anatomy 3 weeks

## 2014-03-28 NOTE — Addendum Note (Signed)
Addended by: Florian Buff on: 03/28/2014 03:14 PM   Modules accepted: Orders

## 2014-04-04 ENCOUNTER — Telehealth: Payer: Self-pay | Admitting: *Deleted

## 2014-04-04 ENCOUNTER — Other Ambulatory Visit: Payer: Medicaid Other

## 2014-04-04 DIAGNOSIS — O289 Unspecified abnormal findings on antenatal screening of mother: Secondary | ICD-10-CM

## 2014-04-04 LAB — MATERNAL SCREEN, INTEGRATED #2
AFP MOM MAT SCREEN: 1.27
AFP, SERUM MAT SCREEN: 41.4
Age risk Down Syndrome: 1:970 {titer}
CROWN RUMP LENGTH MAT SCREEN 2: 81
Calculated Gestational Age: 17.7
Estriol Mom: 0.72
Estriol, Free: 0.8
Inhibin A Dimeric: 231
Inhibin A MoM: 1.64
MSS Down Syndrome: 1:140 {titer}
NT MoM: 1.98
NUCHAL TRANSLUCENCY MAT SCREEN 2: 3.5
NUMBER OF FETUSES MAT SCREEN 2: 1
PAPP-A MOM MAT SCREEN: 2.75
PAPP-A: 2689
Rish for ONTD: <1:5000 {titer}
hCG MoM: 1.45
hCG, Serum: 30.4

## 2014-04-04 NOTE — Telephone Encounter (Signed)
Joaquim Lai from [place] lab called critical result 1.98 NT MOM.  Discussed results with Dr. Elonda Husky who is going to contact pt and have come in today for MaterniT 21 testing.

## 2014-04-08 ENCOUNTER — Telehealth: Payer: Self-pay | Admitting: *Deleted

## 2014-04-08 ENCOUNTER — Encounter: Payer: Self-pay | Admitting: Obstetrics & Gynecology

## 2014-04-08 NOTE — Telephone Encounter (Signed)
Ecardio EOS report sent to [facility] street office for Dr Harrington Challenger review.

## 2014-04-09 ENCOUNTER — Telehealth: Payer: Self-pay | Admitting: Obstetrics & Gynecology
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
## 2014-04-10 ENCOUNTER — Telehealth: Payer: Self-pay | Admitting: *Deleted

## 2014-04-10 ENCOUNTER — Telehealth: Payer: Self-pay | Admitting: Obstetrics & Gynecology

## 2014-04-10 MED ORDER — CLONAZEPAM 0.5 MG PO TABS: 0.5000 mg | ORAL_TABLET | Freq: Two times a day (BID) | ORAL | Status: DC | PRN

## 2014-04-10 MED ORDER — ESCITALOPRAM OXALATE 20 MG PO TABS: 20.0000 mg | ORAL_TABLET | Freq: Every day | ORAL | Status: DC

## 2014-04-10 NOTE — Telephone Encounter (Signed)
Dr. Elonda Husky spoke with pt and approved Lexapro and Klonopin. [place]

## 2014-04-10 NOTE — Telephone Encounter (Signed)
Ron with [employer] calling requesting notes from 03/28/2014 for pt disability due to pregnancy related complication. Dr. Elonda Husky completed letter in regards to pt history and related pregnancy complications and letter faxed to [phone], claim # [id number].

## 2014-04-10 NOTE — Telephone Encounter (Signed)
Spoke with pt letting her know I spoke with her yesterday and routed the note to Dr. Elonda Husky. He hasn't answered message yet. Advised she should hear something this afternoon. Pt voiced understanding. [place]

## 2014-04-16 ENCOUNTER — Ambulatory Visit (INDEPENDENT_AMBULATORY_CARE_PROVIDER_SITE_OTHER): Payer: Medicaid Other | Admitting: Obstetrics & Gynecology

## 2014-04-16 ENCOUNTER — Other Ambulatory Visit: Payer: Self-pay | Admitting: Obstetrics & Gynecology

## 2014-04-16 ENCOUNTER — Encounter: Payer: Self-pay | Admitting: Obstetrics & Gynecology

## 2014-04-16 ENCOUNTER — Ambulatory Visit (INDEPENDENT_AMBULATORY_CARE_PROVIDER_SITE_OTHER): Payer: Medicaid Other

## 2014-04-16 VITALS — BP 110/80 | Wt 212.0 lb

## 2014-04-16 DIAGNOSIS — O289 Unspecified abnormal findings on antenatal screening of mother: Secondary | ICD-10-CM

## 2014-04-16 DIAGNOSIS — D649 Anemia, unspecified: Secondary | ICD-10-CM

## 2014-04-16 DIAGNOSIS — Z331 Pregnant state, incidental: Secondary | ICD-10-CM

## 2014-04-16 DIAGNOSIS — O99019 Anemia complicating pregnancy, unspecified trimester: Secondary | ICD-10-CM

## 2014-04-16 DIAGNOSIS — Z1389 Encounter for screening for other disorder: Secondary | ICD-10-CM

## 2014-04-16 DIAGNOSIS — O0990 Supervision of high risk pregnancy, unspecified, unspecified trimester: Secondary | ICD-10-CM

## 2014-04-16 DIAGNOSIS — O09299 Supervision of pregnancy with other poor reproductive or obstetric history, unspecified trimester: Secondary | ICD-10-CM

## 2014-04-16 LAB — POCT URINALYSIS DIPSTICK
Blood, UA: NEGATIVE
Glucose, UA: NEGATIVE
Ketones, UA: NEGATIVE
LEUKOCYTES UA: NEGATIVE
Nitrite, UA: NEGATIVE
Protein, UA: NEGATIVE

## 2014-04-16 LAB — POCT HEMOGLOBIN: Hemoglobin: 10.7 g/dL — AB (ref 12.2–16.2)

## 2014-04-16 NOTE — Addendum Note (Signed)
Addended by: Linton Rump on: 04/16/2014 12:51 PM   Modules accepted: Orders

## 2014-04-16 NOTE — Progress Notes (Signed)
U/S(20+1wks)-single active fetus, meas c/w dates, fluid wnl, cx appears closed (3.5cm), bilateral adnexa appears wnl, FHR-157 bpm, anterior Gr 0 placenta, no major abnl noted, NB present, NF-4.12mm

## 2014-04-16 NOTE — Progress Notes (Signed)
Sonogram is reviewed and report done  BP weight and urine results all reviewed and noted. Patient reports good fetal movement, denies any bleeding and no rupture of membranes symptoms or regular contractions. Patient is without complaints. All questions were answered.

## 2014-04-18 ENCOUNTER — Other Ambulatory Visit: Payer: Medicaid Other

## 2014-04-25 ENCOUNTER — Other Ambulatory Visit: Payer: Self-pay | Admitting: Obstetrics & Gynecology

## 2014-05-06 ENCOUNTER — Inpatient Hospital Stay (HOSPITAL_COMMUNITY)
Admission: AD | Admit: 2014-05-06 | Discharge: 2014-05-07 | Disposition: A | Discharge status: Home / Self Care | Payer: Medicaid Other | Source: Ambulatory Visit | Attending: Obstetrics & Gynecology | Admitting: Obstetrics & Gynecology

## 2014-05-06 DIAGNOSIS — O0990 Supervision of high risk pregnancy, unspecified, unspecified trimester: Secondary | ICD-10-CM

## 2014-05-06 DIAGNOSIS — Z87891 Personal history of nicotine dependence: Secondary | ICD-10-CM | POA: Insufficient documentation

## 2014-05-06 DIAGNOSIS — O283 Abnormal ultrasonic finding on antenatal screening of mother: Secondary | ICD-10-CM

## 2014-05-06 DIAGNOSIS — M549 Dorsalgia, unspecified: Secondary | ICD-10-CM | POA: Insufficient documentation

## 2014-05-06 DIAGNOSIS — O09299 Supervision of pregnancy with other poor reproductive or obstetric history, unspecified trimester: Secondary | ICD-10-CM

## 2014-05-06 DIAGNOSIS — Z9884 Bariatric surgery status: Secondary | ICD-10-CM | POA: Insufficient documentation

## 2014-05-06 DIAGNOSIS — O99891 Other specified diseases and conditions complicating pregnancy: Secondary | ICD-10-CM | POA: Insufficient documentation

## 2014-05-06 DIAGNOSIS — Z9089 Acquired absence of other organs: Secondary | ICD-10-CM | POA: Insufficient documentation

## 2014-05-06 DIAGNOSIS — R109 Unspecified abdominal pain: Secondary | ICD-10-CM | POA: Insufficient documentation

## 2014-05-06 DIAGNOSIS — O9989 Other specified diseases and conditions complicating pregnancy, childbirth and the puerperium: Principal | ICD-10-CM

## 2014-05-06 DIAGNOSIS — Z8759 Personal history of other complications of pregnancy, childbirth and the puerperium: Secondary | ICD-10-CM

## 2014-05-06 HISTORY — DX: Encounter for other specified aftercare: Z51.89

## 2014-05-06 NOTE — MAU Note (Signed)
Pt reports cramping in lower abd for the last few days, lower back pain today. Denies bleeding or dysuria.

## 2014-05-07 ENCOUNTER — Inpatient Hospital Stay (HOSPITAL_COMMUNITY): Payer: Medicaid Other

## 2014-05-07 ENCOUNTER — Encounter (HOSPITAL_COMMUNITY): Payer: Self-pay | Admitting: *Deleted

## 2014-05-07 DIAGNOSIS — M549 Dorsalgia, unspecified: Secondary | ICD-10-CM

## 2014-05-07 DIAGNOSIS — Z9884 Bariatric surgery status: Secondary | ICD-10-CM

## 2014-05-07 DIAGNOSIS — R109 Unspecified abdominal pain: Secondary | ICD-10-CM

## 2014-05-07 DIAGNOSIS — O9989 Other specified diseases and conditions complicating pregnancy, childbirth and the puerperium: Principal | ICD-10-CM

## 2014-05-07 DIAGNOSIS — O99891 Other specified diseases and conditions complicating pregnancy: Secondary | ICD-10-CM

## 2014-05-07 LAB — URINALYSIS, ROUTINE W REFLEX MICROSCOPIC
BILIRUBIN URINE: NEGATIVE
Glucose, UA: NEGATIVE mg/dL
Hgb urine dipstick: NEGATIVE
KETONES UR: NEGATIVE mg/dL
Leukocytes, UA: NEGATIVE
NITRITE: NEGATIVE
PH: 5.5 (ref 5.0–8.0)
Protein, ur: NEGATIVE mg/dL
Specific Gravity, Urine: 1.01 (ref 1.005–1.030)
Urobilinogen, UA: 0.2 mg/dL (ref 0.0–1.0)

## 2014-05-07 LAB — WET PREP, GENITAL
Clue Cells Wet Prep HPF POC: NONE SEEN
Trich, Wet Prep: NONE SEEN
YEAST WET PREP: NONE SEEN

## 2014-05-07 LAB — GC/CHLAMYDIA PROBE AMP
CT PROBE, AMP APTIMA: NEGATIVE
GC Probe RNA: NEGATIVE

## 2014-05-07 LAB — OB RESULTS CONSOLE GC/CHLAMYDIA
Chlamydia: NEGATIVE
Gonorrhea: NEGATIVE

## 2014-05-07 MED ORDER — CYCLOBENZAPRINE HCL 10 MG PO TABS: 10.0000 mg | ORAL_TABLET | Freq: Two times a day (BID) | ORAL | Status: DC | PRN

## 2014-05-07 MED ORDER — CYCLOBENZAPRINE HCL 5 MG PO TABS
5.0000 mg | ORAL_TABLET | Freq: Once | ORAL | Status: AC
  Administered 2014-05-07: 5 mg via ORAL
  Filled 2014-05-07: qty 1

## 2014-05-07 NOTE — Discharge Instructions (Signed)
Preterm Labor Information Preterm labor is when labor starts at less than 37 weeks of pregnancy. The normal length of a pregnancy is 39 to 41 weeks. CAUSES Often, there is no identifiable underlying cause as to why a woman goes into preterm labor. One of the most common known causes of preterm labor is infection. Infections of the uterus, cervix, vagina, amniotic sac, bladder, kidney, or even the lungs (pneumonia) can cause labor to start. Other suspected causes of preterm labor include:   Urogenital infections, such as yeast infections and bacterial vaginosis.   Uterine abnormalities (uterine shape, uterine septum, fibroids, or bleeding from the placenta).   A cervix that has been operated on (it may fail to stay closed).   Malformations in the fetus.   Multiple gestations (twins, triplets, and so on).   Breakage of the amniotic sac.  RISK FACTORS  Having a previous history of preterm labor.   Having premature rupture of membranes (PROM).   Having a placenta that covers the opening of the cervix (placenta previa).   Having a placenta that separates from the uterus (placental abruption).   Having a cervix that is too weak to hold the fetus in the uterus (incompetent cervix).   Having too much fluid in the amniotic sac (polyhydramnios).   Taking illegal drugs or smoking while pregnant.   Not gaining enough weight while pregnant.   Being younger than 18 and older than 27 years old.   Having a low socioeconomic status.   Being African American. SYMPTOMS Signs and symptoms of preterm labor include:   Menstrual-like cramps, abdominal pain, or back pain.  Uterine contractions that are regular, as frequent as six in an hour, regardless of their intensity (may be mild or painful).  Contractions that start on the top of the uterus and spread down to the lower abdomen and back.   A sense of increased pelvic pressure.   A watery or bloody mucus discharge that  comes from the vagina.  TREATMENT Depending on the length of the pregnancy and other circumstances, your health care provider may suggest bed rest. If necessary, there are medicines that can be given to stop contractions and to mature the fetal lungs. If labor happens before 34 weeks of pregnancy, a prolonged hospital stay may be recommended. Treatment depends on the condition of both you and the fetus.  WHAT SHOULD YOU DO IF YOU THINK YOU ARE IN PRETERM LABOR? Call your health care provider right away. You will need to go to the hospital to get checked immediately. HOW CAN YOU PREVENT PRETERM LABOR IN FUTURE PREGNANCIES? You should:   Stop smoking if you smoke.  Maintain healthy weight gain and avoid chemicals and drugs that are not necessary.  Be watchful for any type of infection.  Inform your health care provider if you have a known history of preterm labor. Document Released: 02/12/2004 Document Revised: 07/25/2013 Document Reviewed: 12/25/2012 [employer] Patient Information 2014 [employer].    

## 2014-05-07 NOTE — MAU Provider Note (Signed)
Chief Complaint:  Back Pain and Abdominal Pain   Penny [name] is a 28 y.o.  G3P1011 with IUP at [redacted]w[redacted]d presenting for Back Pain and Abdominal Pain  She's been having abdominal cramping since Saturday. It occurs roughly every 15 minutes. She has been taking tylenol with no relief. It has been getting worse. Moving makes it worse. Associated with back pain across the lower back.  She was walking more on Saturday than what she normally does.   History of gastric bypass and cholecystectomy. She had a normal bowel movement today. She has had some nausea but no vomiting.  Has had some mucousy white discharge but no odor or itching. She takes klonipin and Fioricet.  Pregnancy has been uncomplicated to date.   Denies any leakage of fluid or vaginal bleeding. Baby has been moving appropriately.   Menstrual History: OB History   Grav Para Term Preterm Abortions TAB SAB Ect Mult Living   '3 1 1  1  1   1      '$ Patient's last menstrual period was 11/19/2013.      Past Medical History  Diagnosis Date  . Migraines   . Interstitial cystitis   . IUD FEB 2010  . Gastritis JULY 2011  . Depression   . 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 Fatty liver   . Obesity (BMI 30-39.9) 2011 228 LBS BMI 36.8  . Iron deficiency anemia 07/23/2010  . Irritable bowel syndrome 2012 DIARRHEA    JUN 2012 TTG IgA 14.9  . Elevated liver enzymes JUL 2011ALK PHOS 111-127 AST  143-267 ALT  213-321T BILI 0.6  ALB  3.7-4.06 Jun 2011 ALK PHOS 118 AST 24 ALT 42 T BILI 0.4 ALB 3.9  . Chronic daily headache   . Ovarian cyst   . PONV (postoperative nausea and vomiting)   . ADD (attention deficit disorder)   . RLQ abdominal pain 07/18/2013  . Menorrhagia 07/18/2013  . Anxiety   . Potassium (K) deficiency   . Dysmenorrhea 09/18/2013  . Stress 09/18/2013  . Patient desires pregnancy 09/18/2013  . Pregnant 12/25/2013    Past Surgical History  Procedure Laterality Date  . Gab  2007    in [place]-POUCH 5 CM  .  Cholecystectomy  2005    biliary dyskinesia  . Cath self every nite      for sodium bicarb injection (discontinued 2013)  . Colonoscopy  JUN 2012 ABD PN/DIARRHEA WITH PROPOFOL    NL COLON  . Upper gastrointestinal endoscopy  JULY 2011 NAUSEA-D125,V6, PH 25    Bx; GASTRITIS, POUCH-5 CM LONG  . Dilation and curettage of uterus    . Tonsillectomy    . Gastric bypass  06/2006  . Savory dilation  06/20/2012    QIH:KVQQ gastritis/Ulcer in the mid jejunum  . Tonsillectomy and adenoidectomy    . Esophagogastroduodenoscopy      Family History  Problem Relation Age of Onset  . Hemochromatosis Maternal Grandmother   . Migraines Maternal Grandmother   . Cancer Maternal Grandmother   . Hypertension Father   . Diabetes Father   . Coronary artery disease Father   . Migraines Paternal Grandmother   . Cancer Mother     breast  . Hemochromatosis Mother   . Coronary artery disease Paternal Grandfather     History  Substance Use Topics  . Smoking status: Former Smoker -- 5 years    Types: Cigarettes    Quit  date: 01/07/2014  . Smokeless tobacco: Never Used  . Alcohol Use: No      Allergies  Allergen Reactions  . Gabapentin Other (See Comments)    PATIENT WAS UNRESPONSIVE AFTER TAKING MEDICATION, BUT VITALS REMAINED STABLE  . Metoclopramide Hcl Anxiety    Patient felt like she was trapped in a box, and cant get out.  Temporary loss of movement, weakness, tingling.    . Topamax [Topiramate] Shortness Of Breath and Other (See Comments)    Adverse Side effects, changed taste for some time, numbness and tingling in extremities.    . Codeine Nausea And Vomiting  . Latex Rash  . Tape Itching and Rash    Adhesive   Prolonged skin rash and itching.      Prescriptions prior to admission  Medication Sig Dispense Refill  . acetaminophen (TYLENOL) 500 MG tablet Take 1,000 mg by mouth every 6 (six) hours as needed for pain.      . Ascorbic Acid (VITAMIN C) 100 MG CHEW Chew 2 tablets by mouth  daily.      Marland Kitchen BIOTIN PO Take 1 tablet by mouth daily.       . butalbital-acetaminophen-caffeine (FIORICET, ESGIC) 50-325-40 MG per tablet TAKE 1 TO 2 TABLETS EVERY 6 HOURS AS NEEDED.  20 tablet  0  . calcium carbonate (TUMS - DOSED IN MG ELEMENTAL CALCIUM) 500 MG chewable tablet Chew 3 tablets by mouth daily as needed for indigestion or heartburn.      . Cholecalciferol (VITAMIN D3) 2000 UNITS TABS Take 1 tablet by mouth daily.      . clonazePAM (KLONOPIN) 0.5 MG tablet Take 1 tablet (0.5 mg total) by mouth 2 (two) times daily as needed for anxiety.  30 tablet  0  . docusate sodium (COLACE) 100 MG capsule Take 100 mg by mouth 2 (two) times daily.      Marland Kitchen escitalopram (LEXAPRO) 20 MG tablet Take 1 tablet (20 mg total) by mouth daily.  30 tablet  11  . Loratadine (CLARITIN PO) Take 1 tablet by mouth daily.      . Multiple Vitamins-Minerals (ZINC PO) Take by mouth daily.      Marland Kitchen OVER THE COUNTER MEDICATION Take 1 tablet by mouth daily. Potassium, magnesium, calcium combination.      . Prenatal Vit-Fe Fumarate-FA (PRENATAL MULTIVITAMIN) TABS Take 1 tablet by mouth at bedtime.       . promethazine (PHENERGAN) 25 MG tablet Take 1 tablet (25 mg total) by mouth every 6 (six) hours as needed for nausea.  22 tablet  3  . clobetasol ointment (TEMOVATE) 5.17 % Apply 1 application topically 2 (two) times daily. Apply to affected area  30 g  2  . ferrous fumarate (HEMOCYTE - 106 MG FE) 325 (106 FE) MG TABS tablet Take 1 tablet by mouth.      . fluconazole (DIFLUCAN) 150 MG tablet Take 1 tablet (150 mg total) by mouth daily.  1 tablet  1  . meclizine (ANTIVERT) 25 MG tablet Take 1 tablet (25 mg total) by mouth 3 (three) times daily as needed for dizziness.  30 tablet  0  . Phenazopyridine HCl (AZO-STANDARD PO) Take 2 tablets by mouth as needed. Urinary discomfort      . polyethylene glycol (MIRALAX / GLYCOLAX) packet Take 17 g by mouth daily as needed. constipation      . potassium chloride (K-DUR) 10 MEQ tablet  Take 1 tablet (10 mEq total) by mouth daily.  30 tablet  11  .  PSEUDOEPHEDRINE HCL PO Take 1 tablet by mouth as needed (allergies).        Review of Systems - Negative except for what is mentioned in HPI.  Physical Exam  Blood pressure 115/71, pulse 95, temperature 98.5 F (36.9 C), temperature source Oral, resp. rate 18, height $RemoveBe'5\' 6"'DyHJjMjTS$  (1.676 m), weight 96.163 kg (212 lb), last menstrual period 11/19/2013, SpO2 98.00%. GENERAL: Well-developed, well-nourished female in no acute distress.  LUNGS: Clear to auscultation bilaterally.  HEART: Regular rate and rhythm. ABDOMEN: Soft, nontender, nondistended, gravid.  EXTREMITIES: Nontender, no edema, 2+ distal pulses. FHT:  Baseline rate 155 bpm   Variability moderate  Accelerations present   Decelerations none Contractions: none   Labs: Results for orders placed during the hospital encounter of 05/06/14 (from the past 24 hour(s))  URINALYSIS, ROUTINE W REFLEX MICROSCOPIC   Collection Time    05/06/14 11:19 PM      Result Value Ref Range   Color, Urine YELLOW  YELLOW   APPearance CLEAR  CLEAR   Specific Gravity, Urine 1.010  1.005 - 1.030   pH 5.5  5.0 - 8.0   Glucose, UA NEGATIVE  NEGATIVE mg/dL   Hgb urine dipstick NEGATIVE  NEGATIVE   Bilirubin Urine NEGATIVE  NEGATIVE   Ketones, ur NEGATIVE  NEGATIVE mg/dL   Protein, ur NEGATIVE  NEGATIVE mg/dL   Urobilinogen, UA 0.2  0.0 - 1.0 mg/dL   Nitrite NEGATIVE  NEGATIVE   Leukocytes, UA NEGATIVE  NEGATIVE  WET PREP, GENITAL   Collection Time    05/07/14  1:00 AM      Result Value Ref Range   Yeast Wet Prep HPF POC NONE SEEN  NONE SEEN   Trich, Wet Prep NONE SEEN  NONE SEEN   Clue Cells Wet Prep HPF POC NONE SEEN  NONE SEEN   WBC, Wet Prep HPF POC FEW (*) NONE SEEN    Imaging Studies:  US Ob Detail + 14 Wk  04/16/2014    DETAILED SECOND TRIMESTER SONOGRAM  Tyshell L Stolp is in the office for detailed second trimester sonogram.  She is a 28 y.o. year old G81P1011 with Estimated Date of  Delivery: 09/02/14   now at  [redacted]w[redacted]d weeks gestation. Thus far the pregnancy has been  complicated by poor OB hx and Abnl NT.   GESTATION: SINGLETON  PRESENTATION: transverse  FETAL ACTIVITY:          Heart rate         157 bpm          The fetus is active.  AMNIOTIC FLUID: The amniotic fluid volume is  normal,   PLACENTA LOCALIZATION:  anterior GRADE 0  CERVIX: Measures 3.5 cm  ADNEXA: The ovaries are normal.   GESTATIONAL AGE AND  BIOMETRICS:  Gestational criteria: Estimated Date of Delivery: 09/02/14  now at [redacted]w[redacted]d  Previous Scans:3              BIPARIETAL DIAMETER           4.82 cm         20+4 weeks  HEAD CIRCUMFERENCE           17.54 cm         20+0 weeks  ABDOMINAL CIRCUMFERENCE           15.54 cm         20+5 weeks  FEMUR LENGTH           3.16 cm  19+6 weeks                                                           AVERAGE EGA(BY THIS SCAN):   20+2 weeks                                                 ESTIMATED FETAL WEIGHT:        347  grams,     ANATOMICAL SURVEY                                                                             COMMENTS CEREBRAL VENTRICLES yes normal   CHOROID PLEXUS yes normal   CEREBELLUM yes normal   CISTERNA MAGNA yes normal   NUCHAL REGION yes normal WNL-4.73mm  ORBITS yes normal   NASAL BONE yes normal   NOSE/LIP yes normal   FACIAL PROFILE yes normal   4 CHAMBERED HEART yes normal   OUTFLOW TRACTS yes normal   DIAPHRAGM yes normal   STOMACH yes normal   RENAL REGION yes normal   BLADDER yes normal   CORD INSERTION yes normal   3 VESSEL CORD yes normal   SPINE yes normal   ARMS/HANDS yes normal   LEGS/FEET yes normal   GENITALIA yes normal         SUSPECTED ABNORMALITIES:  no  QUALITY OF SCAN: satisfactory   TECHNICIAN COMMENTS:  U/S(20+1wks)-single active fetus, meas c/w dates, fluid wnl, cx appears  closed (3.5cm), bilateral adnexa appears wnl, FHR-157 bpm, anterior Gr 0  placenta, no major abnl noted, NB present, NF-4.68mm     A copy of this report including all images  has been saved and backed up to  a second source for retrieval if needed. All measures and details of the  anatomical scan, placentation, fluid volume and pelvic anatomy are  contained in that report.  Lossie Faes 04/16/2014 11:57 AM   Clinical Impression and recommendations:  I have reviewed the sonogram results above, combined with the patient's  current clinical course, below are my impressions and any appropriate  recommendations for management based on the sonographic findings.   1.  [id number] Estimated Date of Delivery: 09/02/14 by  early ultrasound and  confirmed by today's sonographic dating 2.  Normal fetal sonographic findings, specifically normal detailed fetal  anatomical evaluation and more specifically normal nuchal fold measurement  now 3.  Normal general sonographic findings  Recommend routine prenatal care based on this sonogram or as clinically  indicated  Lazaro Arms 04/16/2014 12:13 PM            Assessment: Sarahelizabeth Conway [place] is  28 y.o. G3P1011 at [redacted]w[redacted]d presents with Back Pain and Abdominal Pain  0105 no pooling during speculum exam, no ctx on monitoring. Wet prep and GC/Chlamydia performed   0125 wet prep not showing any signs of  infection.   2:21 AM Cervical length 3cm Plan: Uncertain etiology of cramping, cannot rule out contractions, but closed cervix and CL = 3cm, reassuring not in PTL at this time. FFN not collected with reassuring exam. Pt reassured, given flexeril for cramping and recommended to follow up with Primary OB for reevaluation as needed or return for PTL precautions to MAU.  Pt states understanding.  Rosemarie Ax 6/2/20151:23 AM  I spoke with and examined patient and agree with resident's note and plan of care.  Fredrik Rigger, MD OB Fellow 05/07/2014 1:54 AM

## 2014-05-09 ENCOUNTER — Ambulatory Visit (INDEPENDENT_AMBULATORY_CARE_PROVIDER_SITE_OTHER): Payer: Medicaid Other | Admitting: Obstetrics & Gynecology

## 2014-05-09 ENCOUNTER — Encounter: Payer: Self-pay | Admitting: Obstetrics & Gynecology

## 2014-05-09 VITALS — BP 110/70 | Wt 215.0 lb

## 2014-05-09 DIAGNOSIS — O99019 Anemia complicating pregnancy, unspecified trimester: Secondary | ICD-10-CM

## 2014-05-09 DIAGNOSIS — Z1389 Encounter for screening for other disorder: Secondary | ICD-10-CM

## 2014-05-09 DIAGNOSIS — F32A Depression, unspecified: Secondary | ICD-10-CM | POA: Insufficient documentation

## 2014-05-09 DIAGNOSIS — F329 Major depressive disorder, single episode, unspecified: Secondary | ICD-10-CM

## 2014-05-09 DIAGNOSIS — O289 Unspecified abnormal findings on antenatal screening of mother: Secondary | ICD-10-CM

## 2014-05-09 DIAGNOSIS — Z331 Pregnant state, incidental: Secondary | ICD-10-CM

## 2014-05-09 DIAGNOSIS — D649 Anemia, unspecified: Secondary | ICD-10-CM

## 2014-05-09 DIAGNOSIS — D509 Iron deficiency anemia, unspecified: Secondary | ICD-10-CM

## 2014-05-09 DIAGNOSIS — O99342 Other mental disorders complicating pregnancy, second trimester: Secondary | ICD-10-CM | POA: Insufficient documentation

## 2014-05-09 DIAGNOSIS — O99340 Other mental disorders complicating pregnancy, unspecified trimester: Secondary | ICD-10-CM | POA: Insufficient documentation

## 2014-05-09 DIAGNOSIS — O09899 Supervision of other high risk pregnancies, unspecified trimester: Secondary | ICD-10-CM

## 2014-05-09 DIAGNOSIS — O283 Abnormal ultrasonic finding on antenatal screening of mother: Secondary | ICD-10-CM

## 2014-05-09 LAB — POCT HEMOGLOBIN: Hemoglobin: 11.9 g/dL — AB (ref 12.2–16.2)

## 2014-05-09 LAB — POCT URINALYSIS DIPSTICK
Blood, UA: NEGATIVE
Glucose, UA: NEGATIVE
Ketones, UA: NEGATIVE
Nitrite, UA: NEGATIVE
PROTEIN UA: NEGATIVE

## 2014-05-09 NOTE — Progress Notes (Signed)
High Risk Pregnancy Diagnosis(es):   Depression, anxiety, anemia, thickened nuchal fold  G3P1011 [redacted]w[redacted]d Estimated Date of Delivery: 09/02/14  Blood pressure 110/70, weight 215 lb (97.523 kg), last menstrual period 11/19/2013.  Urinalysis:  negative Chief Complaint:   Chief Complaint  Patient presents with  . Routine Prenatal Visit   HPI:     BP weight and urine results all reviewed and noted. Patient reports good fetal movement, denies any bleeding and no rupture of membranes symptoms or regular contractions.  Fundal Height:  28  Fetal Heart rate:  155 Edema:  negative  Patient is without complaints. All questions were answered.  Assessment:  [redacted]w[redacted]d with depression, stable Anemia, last hgb was ok recheck at PN2  Medication(s) Plans:  Continue her lexapro at 20 mg daily  Treatment Plan:  Follow up 2 weeks for PN2, continue lexapro 20, continue fioricet prn, continue belly band  Follow up in 2 weeks for OB appt

## 2014-05-13 ENCOUNTER — Other Ambulatory Visit: Payer: Self-pay | Admitting: Obstetrics & Gynecology

## 2014-05-13 ENCOUNTER — Telehealth: Payer: Self-pay | Admitting: Obstetrics & Gynecology

## 2014-05-13 DIAGNOSIS — O09299 Supervision of pregnancy with other poor reproductive or obstetric history, unspecified trimester: Secondary | ICD-10-CM

## 2014-05-13 DIAGNOSIS — O283 Abnormal ultrasonic finding on antenatal screening of mother: Secondary | ICD-10-CM

## 2014-05-13 DIAGNOSIS — O0990 Supervision of high risk pregnancy, unspecified, unspecified trimester: Secondary | ICD-10-CM

## 2014-05-13 DIAGNOSIS — F329 Major depressive disorder, single episode, unspecified: Secondary | ICD-10-CM

## 2014-05-13 DIAGNOSIS — O99342 Other mental disorders complicating pregnancy, second trimester: Secondary | ICD-10-CM

## 2014-05-13 DIAGNOSIS — Z8759 Personal history of other complications of pregnancy, childbirth and the puerperium: Secondary | ICD-10-CM

## 2014-05-13 DIAGNOSIS — F32A Depression, unspecified: Secondary | ICD-10-CM

## 2014-05-13 MED ORDER — CYCLOBENZAPRINE HCL 10 MG PO TABS: 10.0000 mg | ORAL_TABLET | Freq: Two times a day (BID) | ORAL | Status: DC | PRN

## 2014-05-13 NOTE — Telephone Encounter (Signed)
Pt states that she needs a refill on her Flexeril, pt states that she is taking about twice a day and no more than that. Pt advised that Dr. Elonda Husky is out of the office until this afternoon and that it would be this afternoon before she heard anything. Pt verbalized understanding.

## 2014-05-13 NOTE — Telephone Encounter (Signed)
Pt aware that Rx has been sent to the pharmacy.  

## 2014-05-17 ENCOUNTER — Encounter: Payer: Self-pay | Admitting: Obstetrics and Gynecology

## 2014-05-17 ENCOUNTER — Telehealth: Payer: Self-pay | Admitting: *Deleted

## 2014-05-17 ENCOUNTER — Ambulatory Visit (INDEPENDENT_AMBULATORY_CARE_PROVIDER_SITE_OTHER): Payer: Medicaid Other | Admitting: Obstetrics and Gynecology

## 2014-05-17 VITALS — BP 100/70 | Wt 210.0 lb

## 2014-05-17 DIAGNOSIS — O0993 Supervision of high risk pregnancy, unspecified, third trimester: Secondary | ICD-10-CM

## 2014-05-17 DIAGNOSIS — O289 Unspecified abnormal findings on antenatal screening of mother: Secondary | ICD-10-CM

## 2014-05-17 DIAGNOSIS — N949 Unspecified condition associated with female genital organs and menstrual cycle: Secondary | ICD-10-CM

## 2014-05-17 DIAGNOSIS — Q211 Atrial septal defect: Secondary | ICD-10-CM | POA: Insufficient documentation

## 2014-05-17 DIAGNOSIS — R102 Pelvic and perineal pain: Secondary | ICD-10-CM

## 2014-05-17 DIAGNOSIS — O99019 Anemia complicating pregnancy, unspecified trimester: Secondary | ICD-10-CM

## 2014-05-17 DIAGNOSIS — D649 Anemia, unspecified: Secondary | ICD-10-CM

## 2014-05-17 DIAGNOSIS — Q2112 Patent foramen ovale: Secondary | ICD-10-CM | POA: Insufficient documentation

## 2014-05-17 DIAGNOSIS — O26899 Other specified pregnancy related conditions, unspecified trimester: Secondary | ICD-10-CM

## 2014-05-17 DIAGNOSIS — Z331 Pregnant state, incidental: Secondary | ICD-10-CM

## 2014-05-17 DIAGNOSIS — O09899 Supervision of other high risk pregnancies, unspecified trimester: Secondary | ICD-10-CM

## 2014-05-17 DIAGNOSIS — Q2111 Secundum atrial septal defect: Secondary | ICD-10-CM | POA: Insufficient documentation

## 2014-05-17 DIAGNOSIS — Z1389 Encounter for screening for other disorder: Secondary | ICD-10-CM

## 2014-05-17 LAB — POCT URINALYSIS DIPSTICK
GLUCOSE UA: NEGATIVE
KETONES UA: NEGATIVE
Leukocytes, UA: NEGATIVE
NITRITE UA: NEGATIVE
RBC UA: NEGATIVE

## 2014-05-17 NOTE — Progress Notes (Signed)
[redacted]w[redacted]d G3P1 HROB workin appt Blood pressure 100/70, weight 210 lb (95.255 kg), last menstrual period 11/19/2013.  Patient is complaining of pain in her back that radiates down her leg as well as cramping pelvic pain.  No bleeding or srom. No abd pain  H/o gastric bypass, had one baby sense and 1 miscarriage  Physical Exam: Musculoskeletal: Negative homans. Non tender calf, non tender thigh Cardiovascular: Peripheral pulses 2+. Skin: Skin normal color.  Gravid uterus Extrem: right leg pulses normal , - Homans, position sense,and light touch normal on Neuro check.  A: Pelvic pressure causing leg pain , due to abd laxity, s/p weight loss.    No evidence DVT

## 2014-05-17 NOTE — Telephone Encounter (Signed)
Spoke with pt. Saw Dr. Elonda Husky last week. Pt states the back spasms are worse. Having stomach cramps, and pain under breasts. Low back pain also. Legs are numb. Advised would need to be seen. Call transferred to front desk. Pt was advised to come on in to be worked in with Dr. Glo Herring. [place]

## 2014-05-22 ENCOUNTER — Ambulatory Visit (INDEPENDENT_AMBULATORY_CARE_PROVIDER_SITE_OTHER): Payer: Medicaid Other | Admitting: Women's Health

## 2014-05-22 ENCOUNTER — Other Ambulatory Visit: Payer: Medicaid Other

## 2014-05-22 ENCOUNTER — Encounter: Payer: Self-pay | Admitting: Women's Health

## 2014-05-22 VITALS — BP 138/68 | Wt 218.0 lb

## 2014-05-22 DIAGNOSIS — Z3480 Encounter for supervision of other normal pregnancy, unspecified trimester: Secondary | ICD-10-CM

## 2014-05-22 DIAGNOSIS — Z9884 Bariatric surgery status: Secondary | ICD-10-CM

## 2014-05-22 DIAGNOSIS — Z1389 Encounter for screening for other disorder: Secondary | ICD-10-CM

## 2014-05-22 DIAGNOSIS — O09899 Supervision of other high risk pregnancies, unspecified trimester: Secondary | ICD-10-CM

## 2014-05-22 DIAGNOSIS — R002 Palpitations: Secondary | ICD-10-CM

## 2014-05-22 DIAGNOSIS — O2390 Unspecified genitourinary tract infection in pregnancy, unspecified trimester: Secondary | ICD-10-CM

## 2014-05-22 DIAGNOSIS — O99840 Bariatric surgery status complicating pregnancy, unspecified trimester: Secondary | ICD-10-CM

## 2014-05-22 DIAGNOSIS — N898 Other specified noninflammatory disorders of vagina: Secondary | ICD-10-CM

## 2014-05-22 DIAGNOSIS — O99019 Anemia complicating pregnancy, unspecified trimester: Secondary | ICD-10-CM

## 2014-05-22 DIAGNOSIS — O289 Unspecified abnormal findings on antenatal screening of mother: Secondary | ICD-10-CM

## 2014-05-22 DIAGNOSIS — R350 Frequency of micturition: Secondary | ICD-10-CM

## 2014-05-22 DIAGNOSIS — O0990 Supervision of high risk pregnancy, unspecified, unspecified trimester: Secondary | ICD-10-CM

## 2014-05-22 DIAGNOSIS — Q211 Atrial septal defect: Secondary | ICD-10-CM

## 2014-05-22 DIAGNOSIS — O283 Abnormal ultrasonic finding on antenatal screening of mother: Secondary | ICD-10-CM

## 2014-05-22 DIAGNOSIS — Q2112 Patent foramen ovale: Secondary | ICD-10-CM

## 2014-05-22 DIAGNOSIS — O26892 Other specified pregnancy related conditions, second trimester: Secondary | ICD-10-CM

## 2014-05-22 DIAGNOSIS — Z331 Pregnant state, incidental: Secondary | ICD-10-CM

## 2014-05-22 LAB — POCT URINALYSIS DIPSTICK
Blood, UA: NEGATIVE
Glucose, UA: NEGATIVE
KETONES UA: NEGATIVE
Leukocytes, UA: NEGATIVE
Nitrite, UA: NEGATIVE

## 2014-05-22 LAB — POCT WET PREP (WET MOUNT): Clue Cells Wet Prep Whiff POC: NEGATIVE

## 2014-05-22 LAB — CBC
HEMATOCRIT: 34.7 % — AB (ref 36.0–46.0)
HEMOGLOBIN: 12.2 g/dL (ref 12.0–15.0)
MCH: 33.7 pg (ref 26.0–34.0)
MCHC: 35.2 g/dL (ref 30.0–36.0)
MCV: 95.9 fL (ref 78.0–100.0)
Platelets: 189 10*3/uL (ref 150–400)
RBC: 3.62 MIL/uL — AB (ref 3.87–5.11)
RDW: 12.7 % (ref 11.5–15.5)
WBC: 8.8 10*3/uL (ref 4.0–10.5)

## 2014-05-22 IMAGING — US US PELVIS COMPLETE
1 series · 13 of 25 positions shown · non-contrast
Comparison: 10/27/2009

CLINICAL DATA: Severe right pelvic pain. Clinical suspicion for
ovarian torsion.

TRANSABDOMINAL AND TRANSVAGINAL ULTRASOUND OF PELVIS
DOPPLER ULTRASOUND OF OVARIES
TECHNIQUE: Both transabdominal and transvaginal ultrasound
examinations of the pelvis were performed. Transabdominal technique
was performed for global imaging of the pelvis including uterus,
ovaries, adnexal regions, and pelvic cul-de-sac.
It was necessary to proceed with endovaginal exam following the
transabdominal exam to visualize the IUD and ovaries.
Color and duplex Doppler ultrasound was utilized to evaluate blood
flow to the ovaries.

[Series 1: us pelvis complete · 0.26mm/px · 13 of 107 slices shown]
[im 1/107]
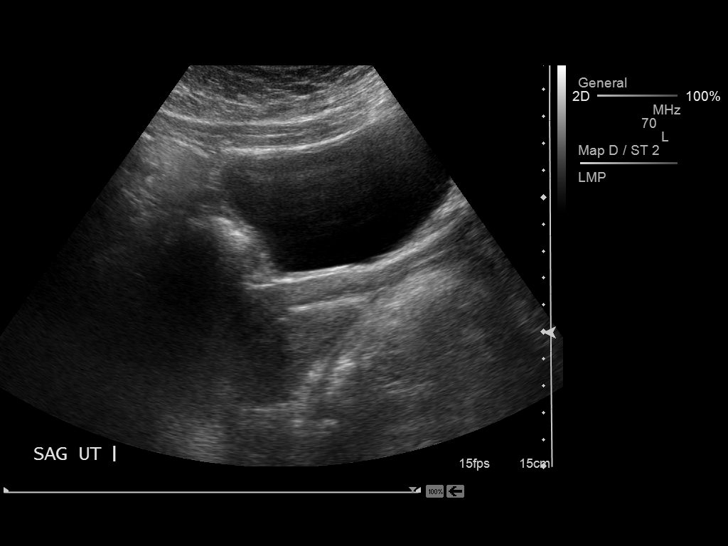
[im 9/107]
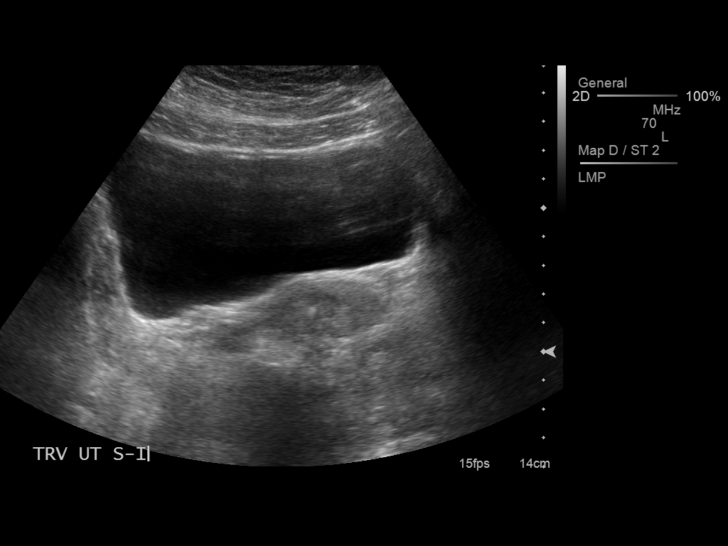
[im 18/107]
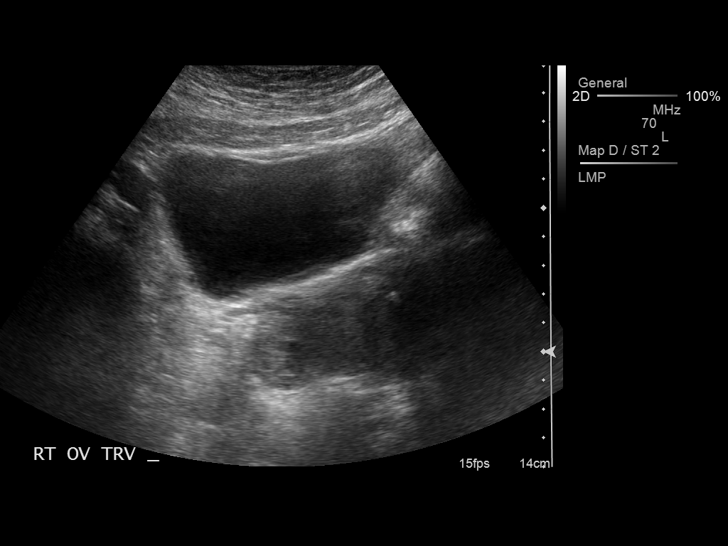
[im 27/107]
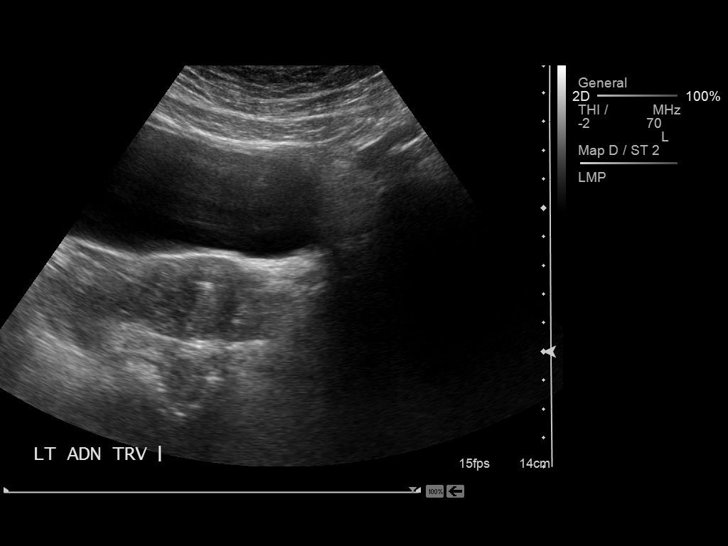
[im 36/107]
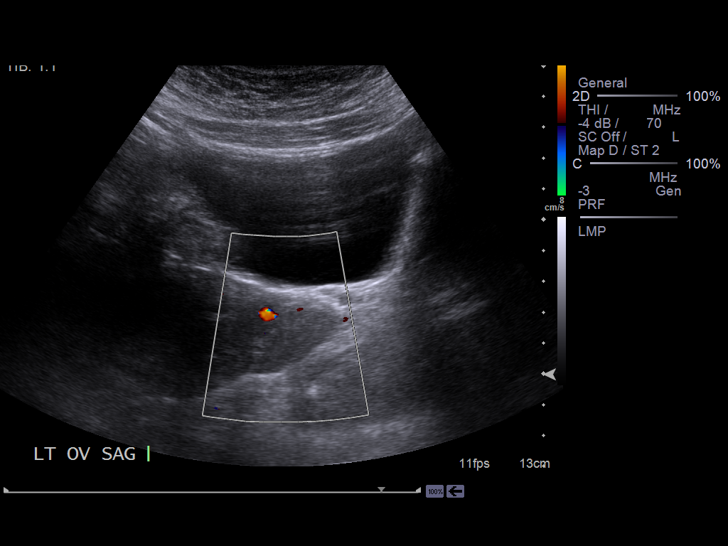
[im 45/107]
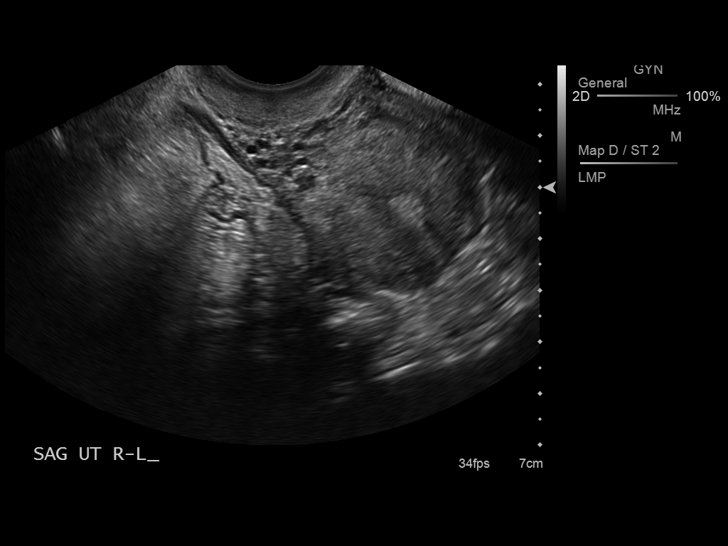
[im 54/107]
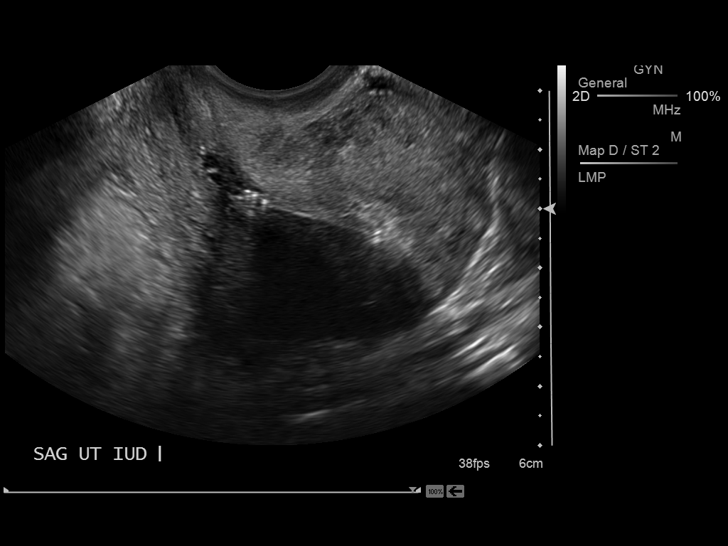
[im 62/107]
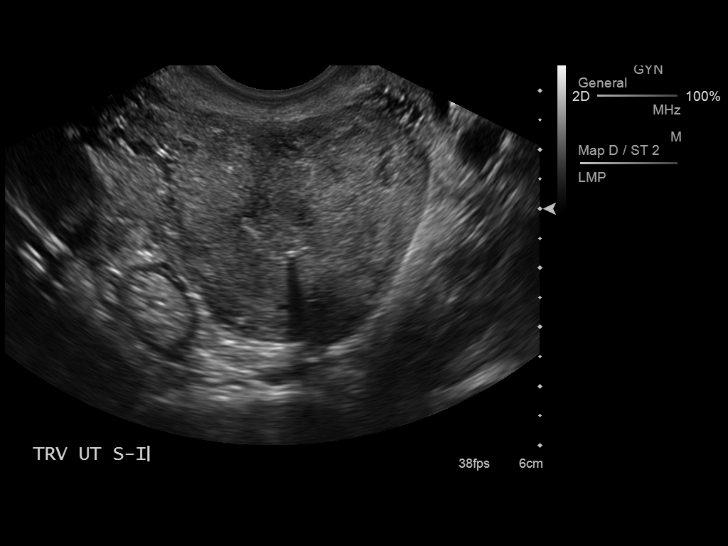
[im 71/107]
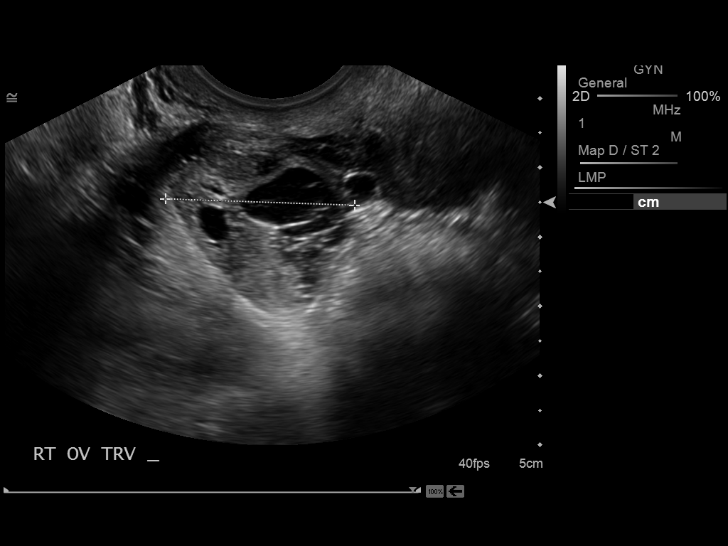
[im 80/107]
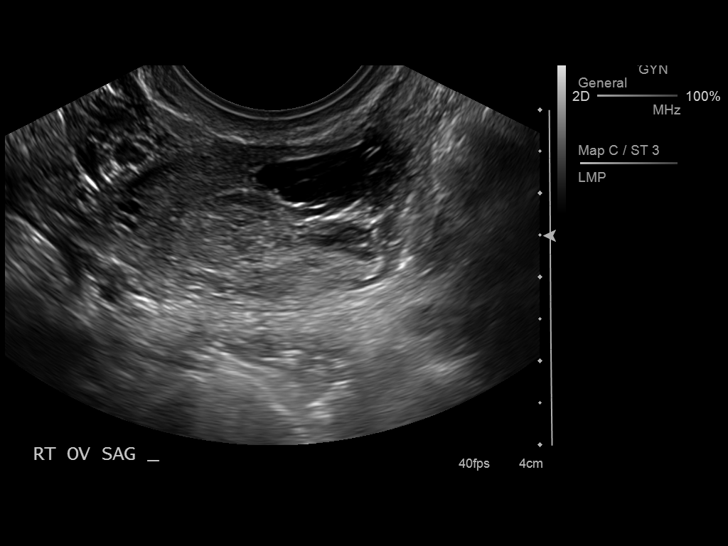
[im 89/107]
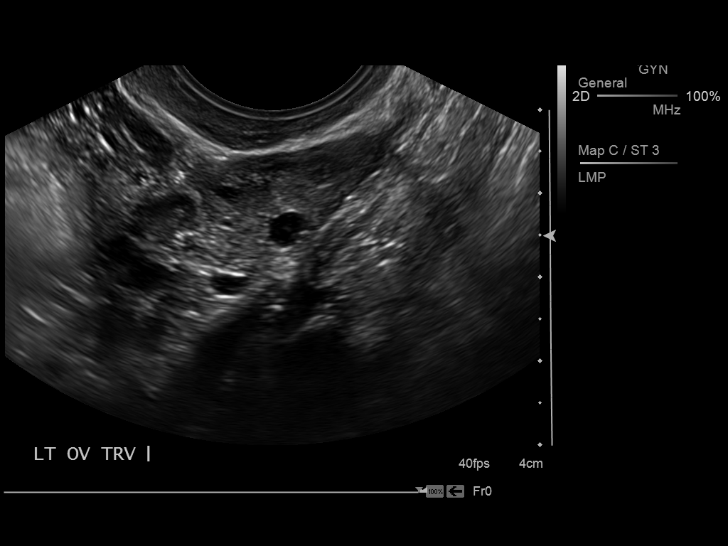
[im 98/107]
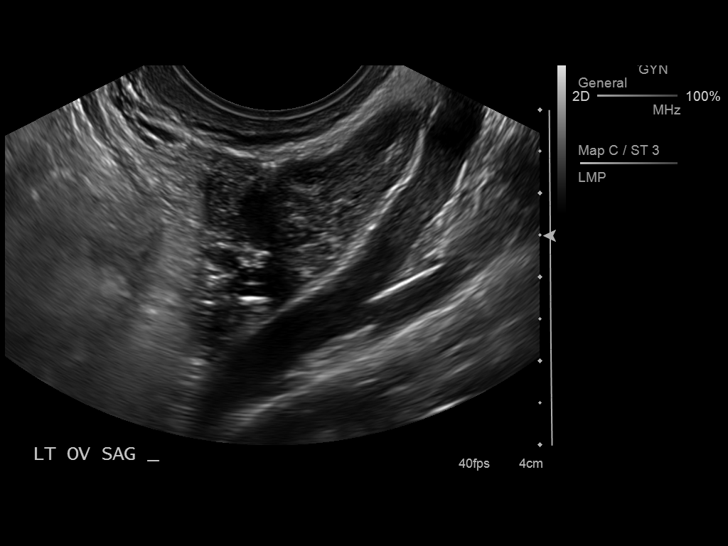
[im 107/107]
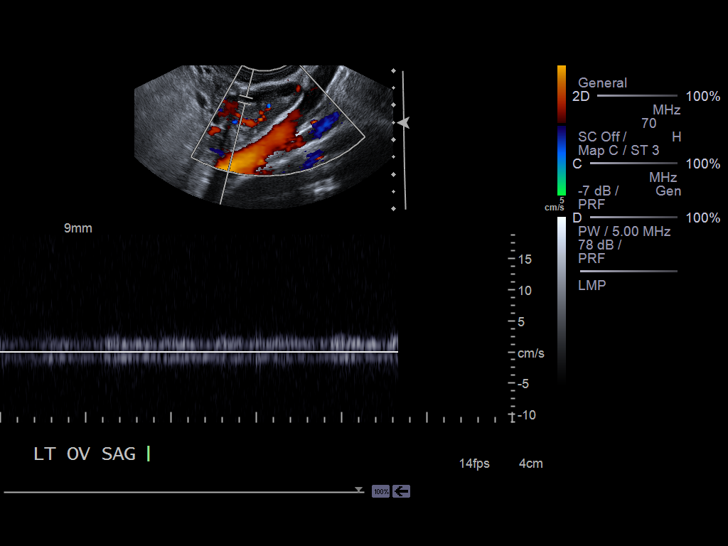

[13 of 25 positions shown; findings below may reference images not displayed]

FINDINGS: Uterus:  6.4 x 5.2 x 4.8 cm.  Uterus is retroverted.  No fibroids
or other uterine mass identified.

Endometrium:  IUD seen in appropriate location in the endometrial
cavity.  Double layer endometrial thickness measures 8 mm
transvaginally.  No focal lesion visualized.

Right ovary: 3.9 x 2.0 x 2.7 cm.  1.8 cm hemorrhagic cyst seen with
benign features.  No ovarian or adnexal mass identified.

Left ovary:   3.8 x 1.2 x 2.3 cm.  Normal appearance.

Pulsed Doppler evaluation demonstrates normal low-resistance
arterial and venous waveforms in both ovaries.
IMPRESSION: 1.  1.8 cm hemorrhagic right ovarian cyst noted.
2.  No sonographic evidence for ovarian torsion or other
significant abnormality.
3.  Retroverted uterus, with IUD in normal location.

## 2014-05-22 MED ORDER — CLOBETASOL PROPIONATE 0.05 % EX OINT: 1.0000 "application " | TOPICAL_OINTMENT | Freq: Two times a day (BID) | CUTANEOUS | Status: DC

## 2014-05-22 NOTE — Progress Notes (Signed)
High Risk Pregnancy Diagnosis(es): h/o gastric bypass, IDA requiring iv fe and prbc's during pregnancy, patent foramen ovale, depression/anxiety on klonopin and lexapro, thickened nuchal fold G3P1011 [redacted]w[redacted]d Estimated Date of Delivery: 09/02/14 Blood pressure 138/68, weight 218 lb (98.884 kg), last menstrual period 11/19/2013.  Urinalysis: Positive for trace protein HPI:  Cramping, lots of pressure, low back pain, d/c w/ odor, needs refill on temovate ointment for vulvar irritation, doing pn2 today- was told she could do jelly beans but couldn't find them anywhere BP, weight, and urine results all reviewed and noted.  Reports good fm. Denies lof, vb.  +cramping, +urinary frequency- denies dysuria, urgency, hematuria. Interested in [place], wants to think more about it. Concerned about 3rd degree lac last birth- states she was told she would need c/s w/ subsequent pregnancies. Discussed that usually not as likely to have bad tears w/ subsequent pregnancies.   Fundal Height:  28 Fetal Heart rate:  160 Edema:  Trace Pelvic: vulva excoriated and red, spec exam: cx visually closed, fFN collected, mod amount thin white nonodorous d/c SVE: LTC, fFN not sent Wet prep: neg  Reviewed ptl s/s, fkc, low back pain/pelvic pressure relief measures including pregnancy belt, recommended increasing fluids/emptying bladder frequently for cramping. All questions were answered.  Assessment: [redacted]w[redacted]d h/o gastric bypass, IDA requiring iv fe and prbc's during pregnancy, patent foramen ovale, depression/anxiety on klonopin and lexapro, thickened nuchal fold Medication(s) Plans:  Continue current medication regimen, refilled temovate ointment Treatment Plan:  Will send urine cx d/t complaints, begin monthly growth scans d/t h/o gastric bypass/malabsorption Follow up in 3wks for high-risk OB appt and growth u/s, sign BTL papers if that's what she desires PN2 today

## 2014-05-22 NOTE — Patient Instructions (Signed)
Second Trimester of Pregnancy The second trimester is from week 13 through week 28, months 4 through 6. The second trimester is often a time when you feel your best. Your body has also adjusted to being pregnant, and you begin to feel better physically. Usually, morning sickness has lessened or quit completely, you may have more energy, and you may have an increase in appetite. The second trimester is also a time when the fetus is growing rapidly. At the end of the sixth month, the fetus is about 9 inches long and weighs about 1 pounds. You will likely begin to feel the baby move (quickening) between 18 and 20 weeks of the pregnancy. BODY CHANGES Your body goes through many changes during pregnancy. The changes vary from woman to woman.   Your weight will continue to increase. You will notice your lower abdomen bulging out.  You may begin to get stretch marks on your hips, abdomen, and breasts.  You may develop headaches that can be relieved by medicines approved by your health care provider.  You may urinate more often because the fetus is pressing on your bladder.  You may develop or continue to have heartburn as a result of your pregnancy.  You may develop constipation because certain hormones are causing the muscles that push waste through your intestines to slow down.  You may develop hemorrhoids or swollen, bulging veins (varicose veins).  You may have back pain because of the weight gain and pregnancy hormones relaxing your joints between the bones in your pelvis and as a result of a shift in weight and the muscles that support your balance.  Your breasts will continue to grow and be tender.  Your gums may bleed and may be sensitive to brushing and flossing.  Dark spots or blotches (chloasma, mask of pregnancy) may develop on your face. This will likely fade after the baby is born.  A dark line from your belly button to the pubic area (linea nigra) may appear. This will likely fade  after the baby is born.  You may have changes in your hair. These can include thickening of your hair, rapid growth, and changes in texture. Some women also have hair loss during or after pregnancy, or hair that feels dry or thin. Your hair will most likely return to normal after your baby is born. WHAT TO EXPECT AT YOUR PRENATAL VISITS During a routine prenatal visit:  You will be weighed to make sure you and the fetus are growing normally.  Your blood pressure will be taken.  Your abdomen will be measured to track your baby's growth.  The fetal heartbeat will be listened to.  Any test results from the previous visit will be discussed. Your health care provider may ask you:  How you are feeling.  If you are feeling the baby move.  If you have had any abnormal symptoms, such as leaking fluid, bleeding, severe headaches, or abdominal cramping.  If you have any questions. Other tests that may be performed during your second trimester include:  Blood tests that check for:  Low iron levels (anemia).  Gestational diabetes (between 24 and 28 weeks).  Rh antibodies.  Urine tests to check for infections, diabetes, or protein in the urine.  An ultrasound to confirm the proper growth and development of the baby.  An amniocentesis to check for possible genetic problems.  Fetal screens for spina bifida and Down syndrome. HOME CARE INSTRUCTIONS   Avoid all smoking, herbs, alcohol, and unprescribed   drugs. These chemicals affect the formation and growth of the baby.  Follow your health care provider's instructions regarding medicine use. There are medicines that are either safe or unsafe to take during pregnancy.  Exercise only as directed by your health care provider. Experiencing uterine cramps is a good sign to stop exercising.  Continue to eat regular, healthy meals.  Wear a good support bra for breast tenderness.  Do not use hot tubs, steam rooms, or saunas.  Wear your  seat belt at all times when driving.  Avoid raw meat, uncooked cheese, cat litter boxes, and soil used by cats. These carry germs that can cause birth defects in the baby.  Take your prenatal vitamins.  Try taking a stool softener (if your health care provider approves) if you develop constipation. Eat more high-fiber foods, such as fresh vegetables or fruit and whole grains. Drink plenty of fluids to keep your urine clear or pale yellow.  Take warm sitz baths to soothe any pain or discomfort caused by hemorrhoids. Use hemorrhoid cream if your health care provider approves.  If you develop varicose veins, wear support hose. Elevate your feet for 15 minutes, 3-4 times a day. Limit salt in your diet.  Avoid heavy lifting, wear low heel shoes, and practice good posture.  Rest with your legs elevated if you have leg cramps or low back pain.  Visit your dentist if you have not gone yet during your pregnancy. Use a soft toothbrush to brush your teeth and be gentle when you floss.  A sexual relationship may be continued unless your health care provider directs you otherwise.  Continue to go to all your prenatal visits as directed by your health care provider. SEEK MEDICAL CARE IF:   You have dizziness.  You have mild pelvic cramps, pelvic pressure, or nagging pain in the abdominal area.  You have persistent nausea, vomiting, or diarrhea.  You have a bad smelling vaginal discharge.  You have pain with urination. SEEK IMMEDIATE MEDICAL CARE IF:   You have a fever.  You are leaking fluid from your vagina.  You have spotting or bleeding from your vagina.  You have severe abdominal cramping or pain.  You have rapid weight gain or loss.  You have shortness of breath with chest pain.  You notice sudden or extreme swelling of your face, hands, ankles, feet, or legs.  You have not felt your baby move in over an hour.  You have severe headaches that do not go away with  medicine.  You have vision changes. Document Released: 11/16/2001 Document Revised: 11/27/2013 Document Reviewed: 01/23/2013 [employer] Patient Information 2015 [employer]. This information is not intended to replace advice given to you by your health care provider. Make sure you discuss any questions you have with your health care provider.  

## 2014-05-23 ENCOUNTER — Other Ambulatory Visit: Payer: Self-pay | Admitting: Obstetrics & Gynecology

## 2014-05-23 ENCOUNTER — Other Ambulatory Visit: Payer: Self-pay | Admitting: *Deleted

## 2014-05-23 DIAGNOSIS — O09899 Supervision of other high risk pregnancies, unspecified trimester: Secondary | ICD-10-CM

## 2014-05-23 DIAGNOSIS — Z8759 Personal history of other complications of pregnancy, childbirth and the puerperium: Secondary | ICD-10-CM

## 2014-05-23 DIAGNOSIS — F329 Major depressive disorder, single episode, unspecified: Secondary | ICD-10-CM

## 2014-05-23 DIAGNOSIS — O09299 Supervision of pregnancy with other poor reproductive or obstetric history, unspecified trimester: Secondary | ICD-10-CM

## 2014-05-23 DIAGNOSIS — O99840 Bariatric surgery status complicating pregnancy, unspecified trimester: Secondary | ICD-10-CM

## 2014-05-23 DIAGNOSIS — O99342 Other mental disorders complicating pregnancy, second trimester: Secondary | ICD-10-CM

## 2014-05-23 DIAGNOSIS — F32A Depression, unspecified: Secondary | ICD-10-CM

## 2014-05-23 DIAGNOSIS — O283 Abnormal ultrasonic finding on antenatal screening of mother: Secondary | ICD-10-CM

## 2014-05-23 LAB — ANTIBODY SCREEN: ANTIBODY SCREEN: NEGATIVE

## 2014-05-23 LAB — GLUCOSE TOLERANCE, 2 HOURS W/ 1HR
GLUCOSE, 2 HOUR: 54 mg/dL — AB (ref 70–139)
GLUCOSE, FASTING: 73 mg/dL (ref 70–99)
Glucose, 1 hour: 120 mg/dL (ref 70–170)

## 2014-05-23 LAB — RPR

## 2014-05-23 LAB — HIV ANTIBODY (ROUTINE TESTING W REFLEX): HIV 1&2 Ab, 4th Generation: NONREACTIVE

## 2014-05-23 LAB — HSV 2 ANTIBODY, IGG: HSV 2 Glycoprotein G Ab, IgG: <0.1 IV

## 2014-05-23 MED ORDER — CLONAZEPAM 0.5 MG PO TABS: 0.5000 mg | ORAL_TABLET | Freq: Two times a day (BID) | ORAL | Status: DC | PRN

## 2014-05-24 ENCOUNTER — Other Ambulatory Visit: Payer: Self-pay | Admitting: Obstetrics & Gynecology

## 2014-05-24 LAB — URINE CULTURE
Colony Count: NO GROWTH
Organism ID, Bacteria: NO GROWTH

## 2014-05-28 ENCOUNTER — Encounter: Payer: Self-pay | Admitting: Women's Health

## 2014-05-28 ENCOUNTER — Telehealth: Payer: Self-pay | Admitting: Obstetrics & Gynecology

## 2014-05-28 NOTE — Telephone Encounter (Signed)
Pt c/o a lot of back pain and cramping. Pt states been in a fetal position in the floor, unable to get out of bed, nauseated. Pt states does not have anything for N/V. Pt states does not have flexeril RX, states lost it.  Pt states not able to come in today to be seen due to no transportation.   Pt informed glucose tolerance test WNL

## 2014-05-31 ENCOUNTER — Other Ambulatory Visit: Payer: Self-pay | Admitting: Family Medicine

## 2014-05-31 ENCOUNTER — Other Ambulatory Visit: Payer: Self-pay | Admitting: Obstetrics & Gynecology

## 2014-05-31 ENCOUNTER — Telehealth: Payer: Self-pay | Admitting: *Deleted

## 2014-05-31 NOTE — Telephone Encounter (Signed)
Spoke with pt. Pt states she called [facility] to send a refill request for Klonopin. We haven't received request. No providers available. I advised to call pharmacy again and we would have it for Monday. Pt voiced understanding. [place]

## 2014-05-31 NOTE — Telephone Encounter (Signed)
Patient currently pregnant

## 2014-06-01 IMAGING — US US ABDOMEN COMPLETE
1 series · 13 of 25 positions shown · non-contrast
Comparison: 07/01/2010
Correlation:  CT abdomen 06/02/2006

CLINICAL DATA: Right upper quadrant pain, abnormal LFTs

ULTRASOUND ABDOMEN:
TECHNIQUE: Sonography of upper abdominal structures was performed.

[Series 1: us abdomen complete · 0.26mm/px · 13 of 61 slices shown]
[im 1/61]
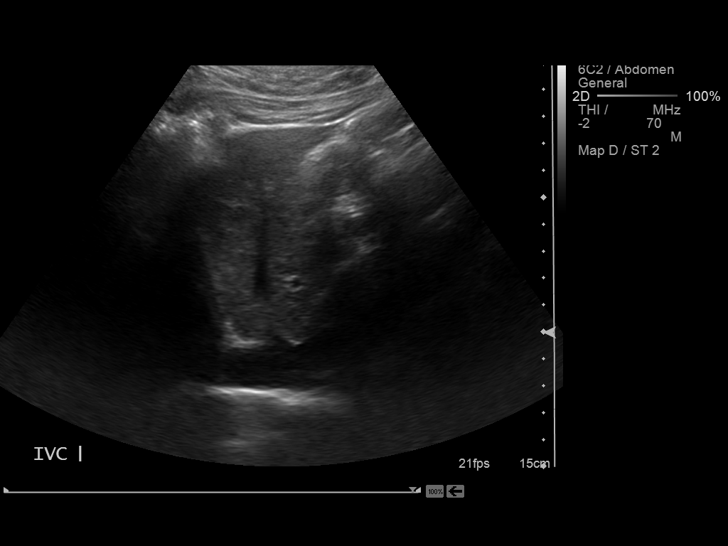
[im 6/61]
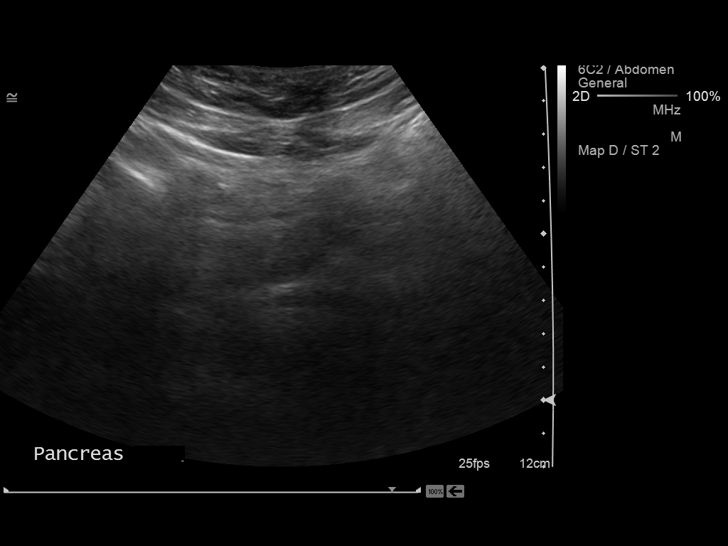
[im 11/61]
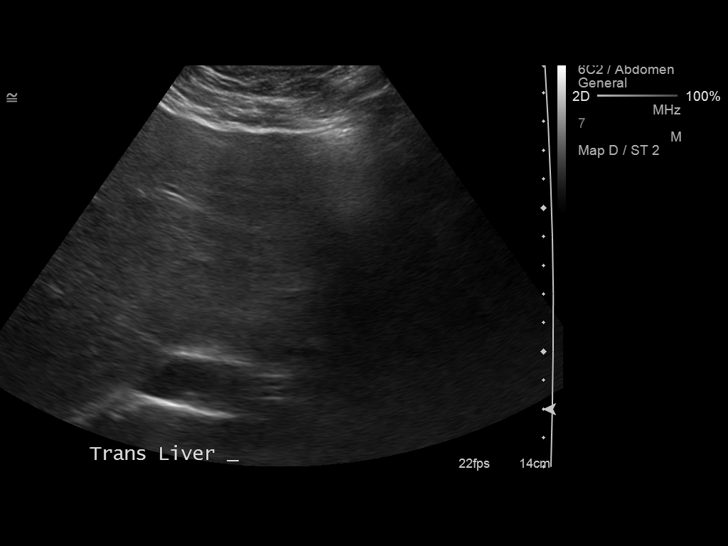
[im 16/61]
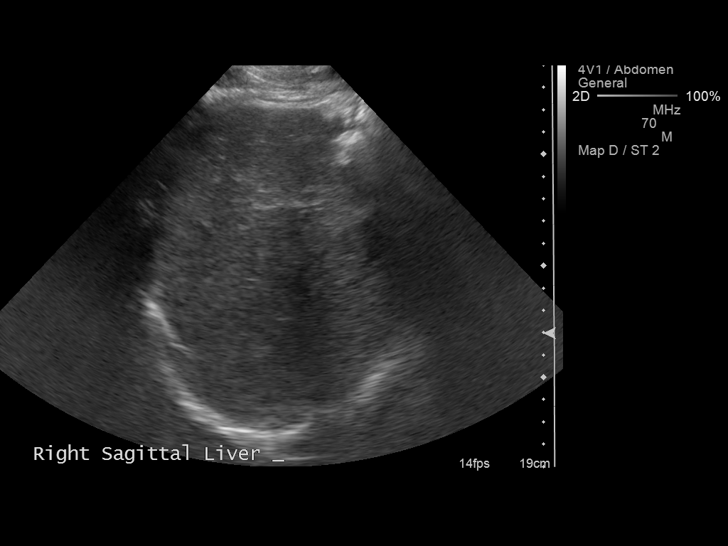
[im 21/61]
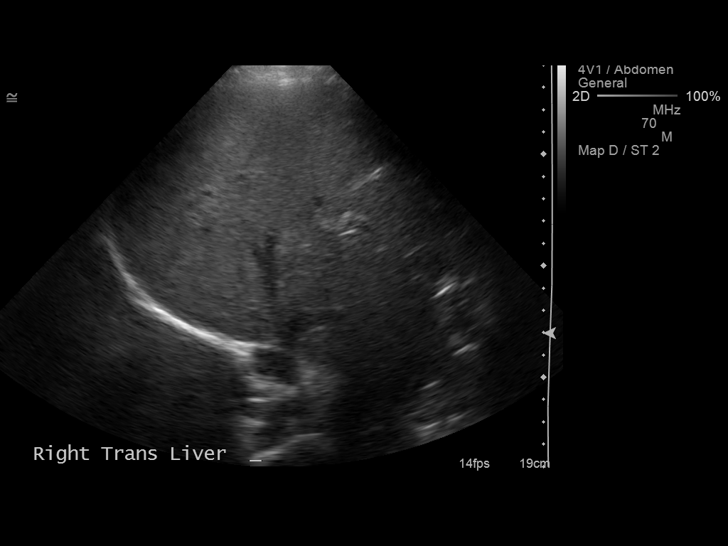
[im 26/61]
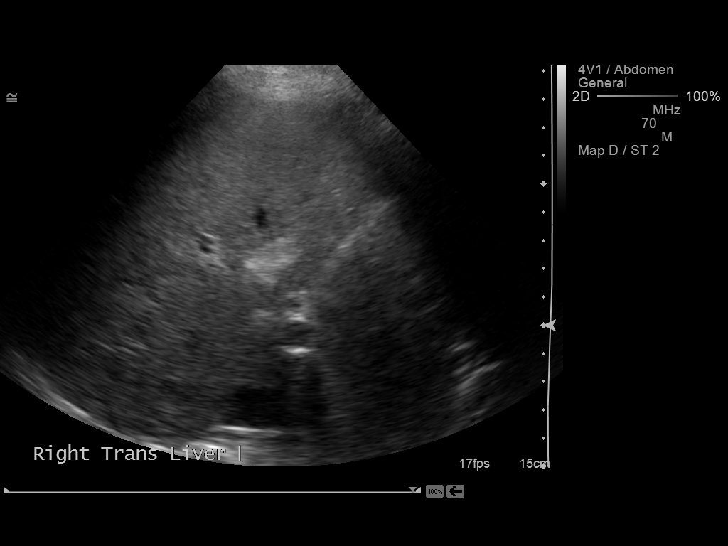
[im 31/61]
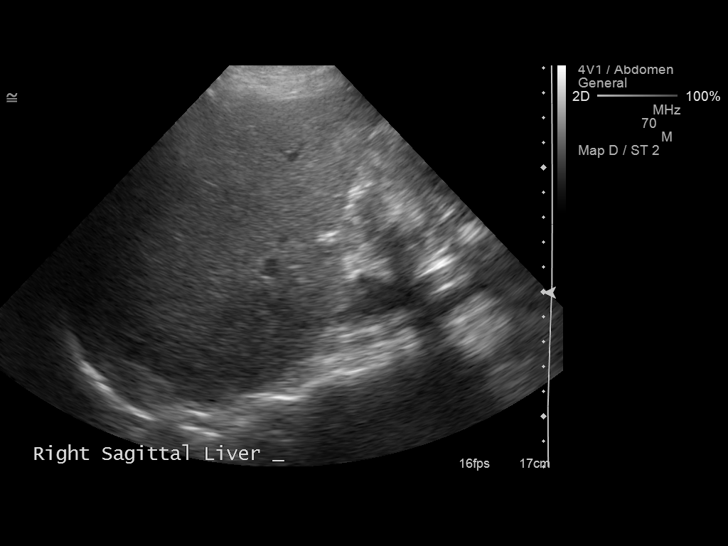
[im 36/61]
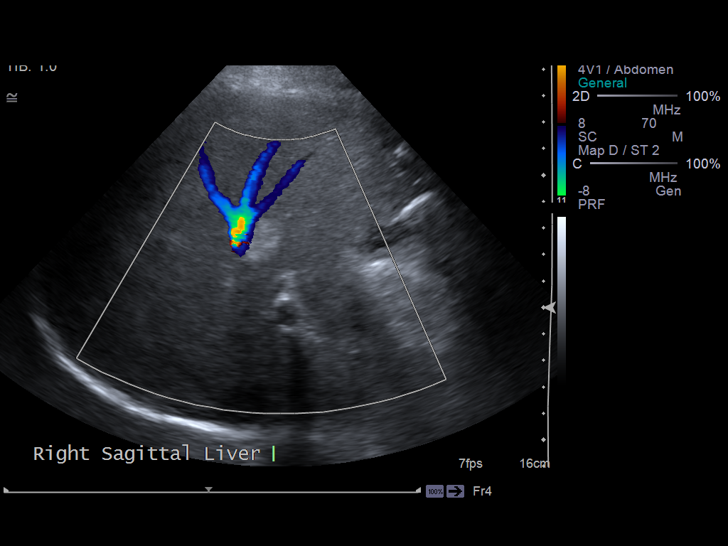
[im 41/61]
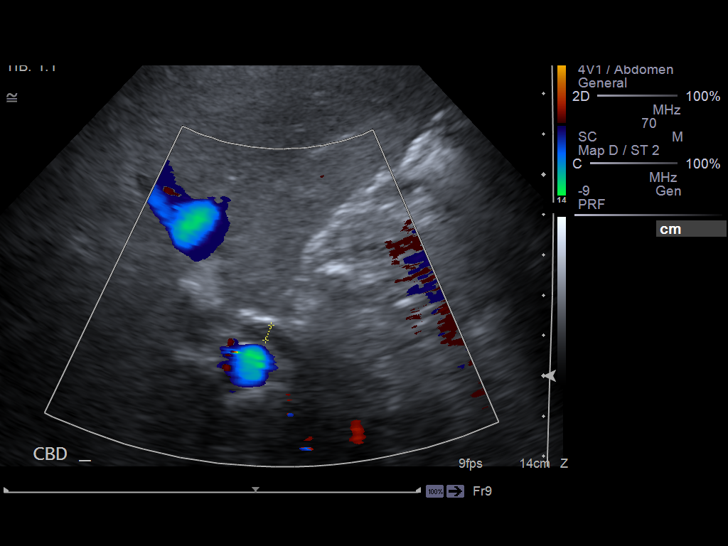
[im 46/61]
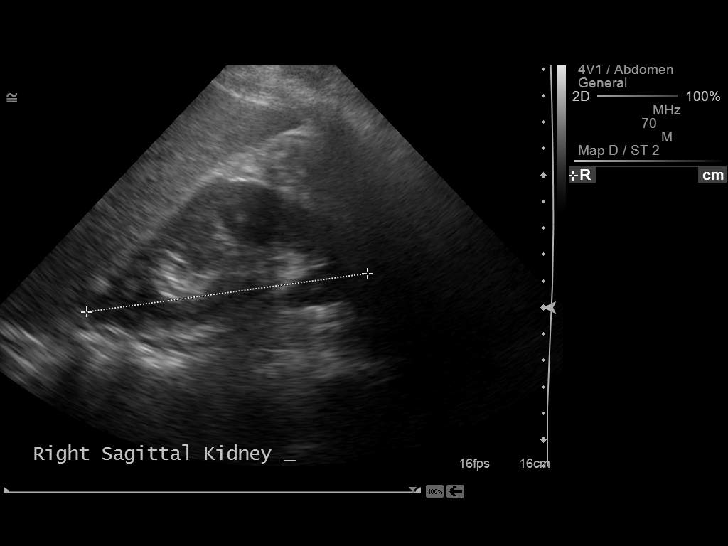
[im 51/61]
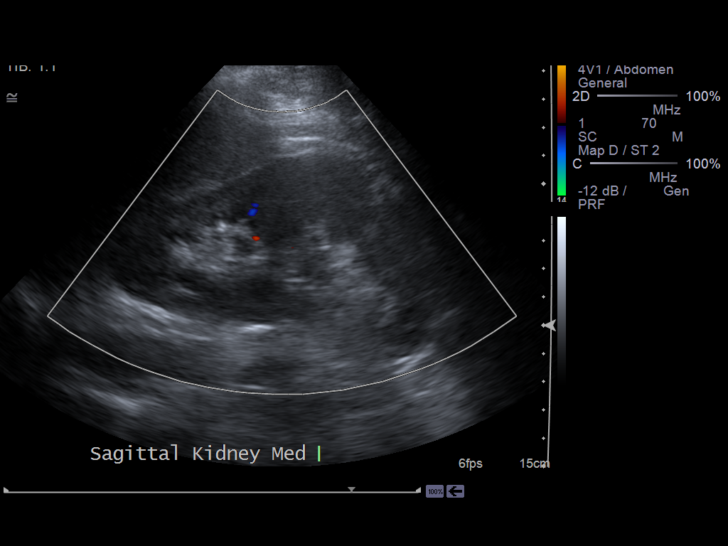
[im 56/61]
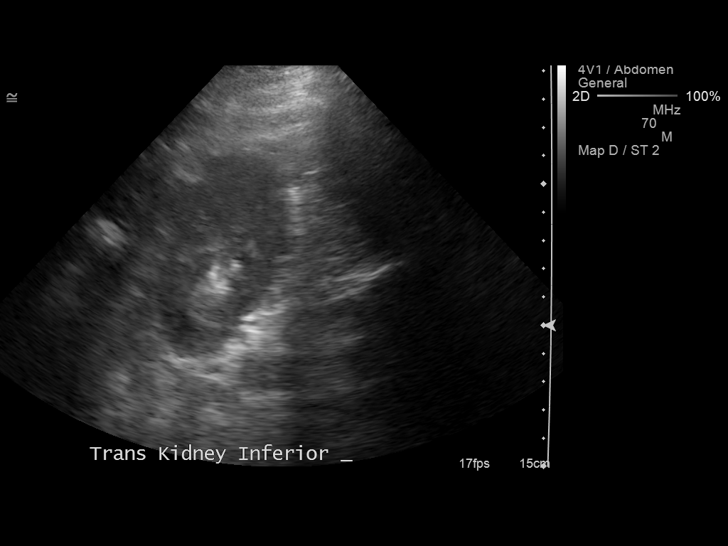
[im 61/61]
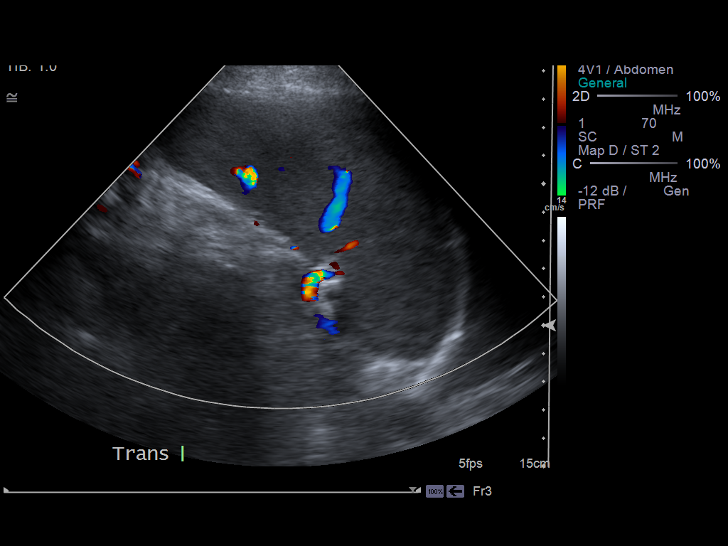

[13 of 25 positions shown; findings below may reference images not displayed]

Gallbladder:  Surgically absent

Common bile duct:  4 mm diameter, normal

Liver:  Echogenic, likely fatty infiltration, though this can be
seen with cirrhosis and certain infiltrative disorders.  Area of
focally increased echogenicity centrally within the right lobe of
the liver, 17 x 10 mm, question small hemangioma, corresponding to
a hypodense focus seen on an earlier CT.  No additional hepatic
mass or nodularity.

IVC:  Normal appearance

Pancreas:  Obscured by bowel gas

Spleen:  Normal appearance, 11.2 cm length

Right kidney:  10.7 cm length. Normal morphology without mass or
hydronephrosis.

Left kidney:  11.9 cm length. Normal morphology without mass or
hydronephrosis.

Aorta:  Normal caliber.

Other:  No free fluid
IMPRESSION: Post cholecystectomy.
Probable fatty infiltration of liver.
Hyperechoic focus centrally within liver, question small
hemangioma.
This roughly corresponds in position with a hypodense lesion seen
on an earlier CT from 1775.
No other definite upper abdominal sonographic abnormalities.

## 2014-06-06 ENCOUNTER — Telehealth: Payer: Self-pay | Admitting: Obstetrics & Gynecology

## 2014-06-06 MED ORDER — TIZANIDINE HCL 4 MG PO CAPS: ORAL_CAPSULE | ORAL | Status: DC

## 2014-06-06 NOTE — Telephone Encounter (Signed)
Pt informed Zanaflex escribed.

## 2014-06-06 NOTE — Telephone Encounter (Signed)
Pt states having cramping pain and consistent pain in back extends to both legs. "pt states feels like something is coming out of her vagina." Pt states currently 4.5 hours away from home. Pt states flexeril is not working. Pt states will be back in town on Monday if Dr. Elonda Husky can give medication to get her through the weekend. Has taken Zanaflex in the past and has helped. Pharmacy RX needs to be sent to [facility], [address], [place], [place] [address].

## 2014-06-14 ENCOUNTER — Encounter: Payer: Medicaid Other | Admitting: Obstetrics and Gynecology

## 2014-06-14 ENCOUNTER — Ambulatory Visit (INDEPENDENT_AMBULATORY_CARE_PROVIDER_SITE_OTHER): Payer: Medicaid Other

## 2014-06-14 ENCOUNTER — Other Ambulatory Visit: Payer: Self-pay | Admitting: Women's Health

## 2014-06-14 DIAGNOSIS — O289 Unspecified abnormal findings on antenatal screening of mother: Secondary | ICD-10-CM

## 2014-06-14 DIAGNOSIS — O0990 Supervision of high risk pregnancy, unspecified, unspecified trimester: Secondary | ICD-10-CM

## 2014-06-14 DIAGNOSIS — O26849 Uterine size-date discrepancy, unspecified trimester: Secondary | ICD-10-CM

## 2014-06-14 DIAGNOSIS — Z9884 Bariatric surgery status: Secondary | ICD-10-CM

## 2014-06-14 DIAGNOSIS — O99840 Bariatric surgery status complicating pregnancy, unspecified trimester: Secondary | ICD-10-CM

## 2014-06-14 NOTE — Progress Notes (Signed)
U/S(28+4wks)-single active vtx fetus, approp growth EFW 2 lb 9 oz (43rd%tile), fluid WNL SDP-8.0cm, FHR- 148 bpm, female fetus, succ. Lobe of placenta noted, anterior/Lt lateral Gr 1 placenta

## 2014-06-17 ENCOUNTER — Telehealth: Payer: Self-pay | Admitting: Obstetrics & Gynecology

## 2014-06-17 ENCOUNTER — Encounter: Payer: Self-pay | Admitting: Women's Health

## 2014-06-17 NOTE — Telephone Encounter (Signed)
Pt c/o a lot of pressure and back pain, +FM, no vaginal bleeding, no gush of fluids. Pt states taking the tizanidine Dr. Elonda Husky prescribed is not helping. Pt also c/o acid reflux, taking tums, with no improvement. Pt states has an appt with Dr. Elonda Husky this Wednesday, July 15.  Pt states does not have transportation to our office to be seen today.

## 2014-06-19 ENCOUNTER — Ambulatory Visit (INDEPENDENT_AMBULATORY_CARE_PROVIDER_SITE_OTHER): Payer: Medicaid Other | Admitting: Obstetrics & Gynecology

## 2014-06-19 ENCOUNTER — Encounter: Payer: Self-pay | Admitting: Obstetrics & Gynecology

## 2014-06-19 VITALS — BP 130/80 | Wt 219.0 lb

## 2014-06-19 DIAGNOSIS — Z3483 Encounter for supervision of other normal pregnancy, third trimester: Secondary | ICD-10-CM

## 2014-06-19 DIAGNOSIS — Z3480 Encounter for supervision of other normal pregnancy, unspecified trimester: Secondary | ICD-10-CM

## 2014-06-19 DIAGNOSIS — Z1389 Encounter for screening for other disorder: Secondary | ICD-10-CM

## 2014-06-19 DIAGNOSIS — M62830 Muscle spasm of back: Secondary | ICD-10-CM

## 2014-06-19 DIAGNOSIS — Z331 Pregnant state, incidental: Secondary | ICD-10-CM

## 2014-06-19 DIAGNOSIS — M5380 Other specified dorsopathies, site unspecified: Secondary | ICD-10-CM

## 2014-06-19 LAB — POCT URINALYSIS DIPSTICK
Glucose, UA: NEGATIVE
KETONES UA: NEGATIVE
Nitrite, UA: NEGATIVE
PROTEIN UA: NEGATIVE
RBC UA: NEGATIVE

## 2014-06-19 NOTE — Addendum Note (Signed)
Addended by: Traci Sermon A on: 06/19/2014 03:40 PM   Modules accepted: Orders

## 2014-06-19 NOTE — Telephone Encounter (Signed)
noted 

## 2014-06-19 NOTE — Progress Notes (Signed)
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   BP weight and urine results all reviewed and noted.  Please refer to the obstetrical flow sheet for the fundal height and fetal heart rate documentation:  Patient reports good fetal movement, denies any bleeding and no rupture of membranes symptoms or regular contractions. Patient is without complaints. All questions were answered.  Plan:  Continued routine obstetrical care,   Follow up in 2 weeks for OB appointment, routine

## 2014-06-21 ENCOUNTER — Telehealth: Payer: Self-pay | Admitting: Obstetrics & Gynecology

## 2014-06-24 ENCOUNTER — Other Ambulatory Visit: Payer: Self-pay | Admitting: Obstetrics & Gynecology

## 2014-06-24 MED ORDER — CLONAZEPAM 0.5 MG PO TABS: 0.5000 mg | ORAL_TABLET | Freq: Two times a day (BID) | ORAL | Status: DC | PRN

## 2014-06-24 NOTE — Telephone Encounter (Signed)
Requesting refill on Klonopin 0.5 mg bid.

## 2014-06-29 ENCOUNTER — Other Ambulatory Visit: Payer: Self-pay | Admitting: Adult Health

## 2014-07-01 ENCOUNTER — Telehealth: Payer: Self-pay | Admitting: Obstetrics & Gynecology

## 2014-07-01 NOTE — Telephone Encounter (Signed)
Spoke with pt. Pt states she is having back spasms again and needs a refill on Zanaflex. Uses [facility]. Please advise. Thanks!!! [facility]

## 2014-07-02 ENCOUNTER — Telehealth: Payer: Self-pay | Admitting: Obstetrics & Gynecology

## 2014-07-02 MED ORDER — TIZANIDINE HCL 4 MG PO CAPS: ORAL_CAPSULE | ORAL | Status: DC

## 2014-07-02 NOTE — Telephone Encounter (Signed)
Pt aware Zanaflex was sent to pharmacy. [place]

## 2014-07-02 NOTE — Telephone Encounter (Signed)
Pt is requesting Zanaflex. Note was sent to Dr. Elonda Husky yesterday. Encounter closed. [place]

## 2014-07-03 ENCOUNTER — Encounter: Payer: Self-pay | Admitting: Obstetrics & Gynecology

## 2014-07-03 ENCOUNTER — Ambulatory Visit (INDEPENDENT_AMBULATORY_CARE_PROVIDER_SITE_OTHER): Payer: Medicaid Other | Admitting: Obstetrics & Gynecology

## 2014-07-03 VITALS — BP 100/70 | Wt 226.0 lb

## 2014-07-03 DIAGNOSIS — Z3483 Encounter for supervision of other normal pregnancy, third trimester: Secondary | ICD-10-CM

## 2014-07-03 DIAGNOSIS — Z331 Pregnant state, incidental: Secondary | ICD-10-CM

## 2014-07-03 DIAGNOSIS — Z1389 Encounter for screening for other disorder: Secondary | ICD-10-CM

## 2014-07-03 DIAGNOSIS — O09899 Supervision of other high risk pregnancies, unspecified trimester: Secondary | ICD-10-CM

## 2014-07-03 LAB — POCT URINALYSIS DIPSTICK
Glucose, UA: NEGATIVE
Ketones, UA: NEGATIVE
Leukocytes, UA: NEGATIVE
Nitrite, UA: NEGATIVE
Protein, UA: NEGATIVE
RBC UA: NEGATIVE

## 2014-07-03 NOTE — Progress Notes (Signed)
[id number] [redacted]w[redacted]d Estimated Date of Delivery: 09/02/14  Blood pressure 100/70, weight 226 lb (102.513 kg), last menstrual period 11/19/2013.   BP weight and urine results all reviewed and noted.  Please refer to the obstetrical flow sheet for the fundal height and fetal heart rate documentation:  Patient reports good fetal movement, denies any bleeding and no rupture of membranes symptoms or regular contractions. Patient is without complaints. All questions were answered.  Plan:  Continued routine obstetrical care,   Follow up in 2 weeks for OB appointment, routine

## 2014-07-03 NOTE — Addendum Note (Signed)
Addended by: Farley Ly on: 07/03/2014 04:41 PM   Modules accepted: Orders

## 2014-07-07 ENCOUNTER — Encounter (HOSPITAL_COMMUNITY): Payer: Self-pay | Admitting: *Deleted

## 2014-07-07 ENCOUNTER — Inpatient Hospital Stay (HOSPITAL_COMMUNITY): Payer: Medicaid Other

## 2014-07-07 ENCOUNTER — Inpatient Hospital Stay (HOSPITAL_COMMUNITY)
Admission: AD | Admit: 2014-07-07 | Discharge: 2014-07-10 | DRG: 781 | Disposition: A | Discharge status: Home / Self Care | Payer: Medicaid Other | Source: Ambulatory Visit | Attending: Obstetrics & Gynecology | Admitting: Obstetrics & Gynecology

## 2014-07-07 DIAGNOSIS — O09293 Supervision of pregnancy with other poor reproductive or obstetric history, third trimester: Secondary | ICD-10-CM

## 2014-07-07 DIAGNOSIS — Z87891 Personal history of nicotine dependence: Secondary | ICD-10-CM

## 2014-07-07 DIAGNOSIS — O139 Gestational [pregnancy-induced] hypertension without significant proteinuria, unspecified trimester: Secondary | ICD-10-CM | POA: Diagnosis present

## 2014-07-07 DIAGNOSIS — Z8249 Family history of ischemic heart disease and other diseases of the circulatory system: Secondary | ICD-10-CM

## 2014-07-07 DIAGNOSIS — M545 Low back pain, unspecified: Secondary | ICD-10-CM | POA: Diagnosis present

## 2014-07-07 DIAGNOSIS — N3010 Interstitial cystitis (chronic) without hematuria: Secondary | ICD-10-CM | POA: Diagnosis present

## 2014-07-07 DIAGNOSIS — O133 Gestational [pregnancy-induced] hypertension without significant proteinuria, third trimester: Secondary | ICD-10-CM

## 2014-07-07 DIAGNOSIS — Z833 Family history of diabetes mellitus: Secondary | ICD-10-CM

## 2014-07-07 DIAGNOSIS — O09899 Supervision of other high risk pregnancies, unspecified trimester: Secondary | ICD-10-CM

## 2014-07-07 DIAGNOSIS — F329 Major depressive disorder, single episode, unspecified: Secondary | ICD-10-CM

## 2014-07-07 DIAGNOSIS — O169 Unspecified maternal hypertension, unspecified trimester: Secondary | ICD-10-CM | POA: Diagnosis present

## 2014-07-07 DIAGNOSIS — K589 Irritable bowel syndrome without diarrhea: Secondary | ICD-10-CM | POA: Diagnosis present

## 2014-07-07 DIAGNOSIS — O99342 Other mental disorders complicating pregnancy, second trimester: Secondary | ICD-10-CM

## 2014-07-07 DIAGNOSIS — O283 Abnormal ultrasonic finding on antenatal screening of mother: Secondary | ICD-10-CM

## 2014-07-07 DIAGNOSIS — F32A Depression, unspecified: Secondary | ICD-10-CM

## 2014-07-07 DIAGNOSIS — R030 Elevated blood-pressure reading, without diagnosis of hypertension: Secondary | ICD-10-CM | POA: Diagnosis present

## 2014-07-07 DIAGNOSIS — O99840 Bariatric surgery status complicating pregnancy, unspecified trimester: Secondary | ICD-10-CM | POA: Diagnosis present

## 2014-07-07 DIAGNOSIS — O2390 Unspecified genitourinary tract infection in pregnancy, unspecified trimester: Secondary | ICD-10-CM | POA: Diagnosis present

## 2014-07-07 DIAGNOSIS — R109 Unspecified abdominal pain: Secondary | ICD-10-CM | POA: Diagnosis present

## 2014-07-07 DIAGNOSIS — O9989 Other specified diseases and conditions complicating pregnancy, childbirth and the puerperium: Principal | ICD-10-CM

## 2014-07-07 DIAGNOSIS — K7689 Other specified diseases of liver: Secondary | ICD-10-CM | POA: Diagnosis present

## 2014-07-07 DIAGNOSIS — R197 Diarrhea, unspecified: Secondary | ICD-10-CM | POA: Diagnosis present

## 2014-07-07 DIAGNOSIS — Z8759 Personal history of other complications of pregnancy, childbirth and the puerperium: Secondary | ICD-10-CM

## 2014-07-07 DIAGNOSIS — O99891 Other specified diseases and conditions complicating pregnancy: Principal | ICD-10-CM | POA: Diagnosis present

## 2014-07-07 HISTORY — DX: Bariatric surgery status: Z98.84

## 2014-07-07 HISTORY — DX: Cardiac murmur, unspecified: R01.1

## 2014-07-07 LAB — URINALYSIS, ROUTINE W REFLEX MICROSCOPIC
BILIRUBIN URINE: NEGATIVE
Glucose, UA: NEGATIVE mg/dL
HGB URINE DIPSTICK: NEGATIVE
Ketones, ur: 40 mg/dL — AB
Nitrite: NEGATIVE
Protein, ur: NEGATIVE mg/dL
SPECIFIC GRAVITY, URINE: 1.01 (ref 1.005–1.030)
Urobilinogen, UA: 0.2 mg/dL (ref 0.0–1.0)
pH: 6 (ref 5.0–8.0)

## 2014-07-07 LAB — COMPREHENSIVE METABOLIC PANEL
ALBUMIN: 3 g/dL — AB (ref 3.5–5.2)
ALK PHOS: 145 U/L — AB (ref 39–117)
ALT: 9 U/L (ref 0–35)
AST: 13 U/L (ref 0–37)
Anion gap: 17 — ABNORMAL HIGH (ref 5–15)
BUN: 5 mg/dL — ABNORMAL LOW (ref 6–23)
CO2: 18 mEq/L — ABNORMAL LOW (ref 19–32)
Calcium: 8.8 mg/dL (ref 8.4–10.5)
Chloride: 104 mEq/L (ref 96–112)
Creatinine, Ser: 0.47 mg/dL — ABNORMAL LOW (ref 0.50–1.10)
GFR calc Af Amer: >90 mL/min (ref >90)
GFR calc non Af Amer: >90 mL/min (ref >90)
Glucose, Bld: 82 mg/dL (ref 70–99)
POTASSIUM: 3.9 meq/L (ref 3.7–5.3)
Sodium: 139 mEq/L (ref 137–147)
TOTAL PROTEIN: 6.2 g/dL (ref 6.0–8.3)
Total Bilirubin: 0.5 mg/dL (ref 0.3–1.2)

## 2014-07-07 LAB — CBC
HCT: 34.4 % — ABNORMAL LOW (ref 36.0–46.0)
Hemoglobin: 12.2 g/dL (ref 12.0–15.0)
MCH: 32.6 pg (ref 26.0–34.0)
MCHC: 35.5 g/dL (ref 30.0–36.0)
MCV: 92 fL (ref 78.0–100.0)
PLATELETS: 229 10*3/uL (ref 150–400)
RBC: 3.74 MIL/uL — ABNORMAL LOW (ref 3.87–5.11)
RDW: 12.3 % (ref 11.5–15.5)
WBC: 10.7 10*3/uL — AB (ref 4.0–10.5)

## 2014-07-07 LAB — URINE MICROSCOPIC-ADD ON

## 2014-07-07 MED ORDER — LACTATED RINGERS IV BOLUS (SEPSIS)
1000.0000 mL | Freq: Once | INTRAVENOUS | Status: AC
  Administered 2014-07-07: 1000 mL via INTRAVENOUS

## 2014-07-07 MED ORDER — CYCLOBENZAPRINE HCL 10 MG PO TABS
10.0000 mg | ORAL_TABLET | Freq: Once | ORAL | Status: AC
  Administered 2014-07-07: 10 mg via ORAL
  Filled 2014-07-07: qty 1

## 2014-07-07 NOTE — MAU Provider Note (Signed)
Chief Complaint:  No chief complaint on file.   First Provider Initiated Contact with Patient 07/07/14 2158      HPI: Penny [name] is a 28 y.o. G3P1011 at [redacted]w[redacted]d who presents to maternity admissions reporting worsening lower abdominal and low back pain as well as dizziness and fatigue. The LBP had responded to Zanaflex which puts her to sleep. It got worse 2 days ago when she ran out out of the Zanaflex. She has been having diarrhea "10" BMs per day for 3 weeks. She has chronic anemia and was transfused in first trimester. The abdominal pain is diffuse but more in lower abdomen and is worse with moving.   Denies contractions, leakage of fluid or vaginal bleeding. Good fetal movement.   Pregnancy Course: FT : Please see PL below. Normal 2 hrOGTT; [name] 43rd%ile at 28.4wks  Past Medical History: Past Medical History  Diagnosis Date  . Migraines   . Interstitial cystitis   . IUD FEB 2010  . Gastritis JULY 2011  . Depression   . 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 Fatty liver   . Obesity (BMI 30-39.9) 2011 228 LBS BMI 36.8  . Iron deficiency anemia 07/23/2010  . Irritable bowel syndrome 2012 DIARRHEA    JUN 2012 TTG IgA 14.9  . Elevated liver enzymes JUL 2011ALK PHOS 111-127 AST  143-267 ALT  213-321T BILI 0.6  ALB  3.7-4.06 Jun 2011 ALK PHOS 118 AST 24 ALT 42 T BILI 0.4 ALB 3.9  . Chronic daily headache   . Ovarian cyst   . PONV (postoperative nausea and vomiting)   . ADD (attention deficit disorder)   . RLQ abdominal pain 07/18/2013  . Menorrhagia 07/18/2013  . Anxiety   . Potassium (K) deficiency   . Dysmenorrhea 09/18/2013  . Stress 09/18/2013  . Patient desires pregnancy 09/18/2013  . Pregnant 12/25/2013  . Blood transfusion without reported diagnosis   . Heart murmur    Patient Active Problem List   Diagnosis Date Noted  . Previous gastric bypass affecting pregnancy, antepartum 05/22/2014  . 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 Depression  complicating pregnancy in second trimester, antepartum 05/09/2014  . Nuchal fold thickening on prental ultrasound 03/09/2014  . Supervision of other high-risk pregnancy(V23.89) 02/18/2014  . Rapid palpitations 02/18/2014  . History of gestational hypertension 02/18/2014  . H/O maternal third degree perineal laceration, currently pregnant 02/18/2014  . Maternal iron deficiency anemia complicating pregnancy 24/58/0998  . Restless legs 01/16/2014  . Chronic interstitial cystitis 10/23/2013  . Menorrhagia 07/18/2013  . h/o Opiate addiction 03/11/2012    Class: Acute  . Passive suicidal ideations 03/10/2012    Class: Acute  . History of migraine headaches 03/10/2012    Class: Acute  . Depression with anxiety 03/10/2012    Class: Chronic  . 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 IBS (irritable bowel syndrome) 08/25/2011  . Diarrhea 05/27/2011  . OBESITY, UNSPECIFIED 09/17/2010  . LIVER FUNCTION TESTS, ABNORMAL, HX OF 09/17/2010  . Iron deficiency anemia 07/23/2010    Past obstetric history: OB History  Gravida Para Term Preterm AB SAB TAB Ectopic Multiple Living  $Remov'3 1 1  1 1    1    'RdZjve$ # Outcome Date GA Lbr Len/2nd Weight Sex Delivery Anes PTL Lv  3  CUR           2 TRM 2010 [redacted]w[redacted]d  3.572 kg (7 lb 14 oz) M SVD EPI  Y     Comments: IOL d/t PIH  1 SAB               Past Surgical History: Past Surgical History  Procedure Laterality Date  . Gab  2007    in [place]-POUCH 5 CM  . Cholecystectomy  2005    biliary dyskinesia  . Cath self every nite      for sodium bicarb injection (discontinued 2013)  . Colonoscopy  JUN 2012 ABD PN/DIARRHEA WITH PROPOFOL    NL COLON  . Upper gastrointestinal endoscopy  JULY 2011 NAUSEA-D125,V6, PH 25    Bx; GASTRITIS, POUCH-5 CM LONG  . Dilation and curettage of uterus    . Tonsillectomy    . Gastric  bypass  06/2006  . Savory dilation  06/20/2012    VFI:EPPI gastritis/Ulcer in the mid jejunum  . Tonsillectomy and adenoidectomy    . Esophagogastroduodenoscopy    . Wisdom tooth extraction       Family History: Family History  Problem Relation Age of Onset  . Hemochromatosis Maternal Grandmother   . Migraines Maternal Grandmother   . Cancer Maternal Grandmother   . Hypertension Father   . Diabetes Father   . Coronary artery disease Father   . Migraines Paternal Grandmother   . Cancer Mother     breast  . Hemochromatosis Mother   . Coronary artery disease Paternal Grandfather     Social History: History  Substance Use Topics  . Smoking status: Former Smoker -- 5 years    Types: Cigarettes    Quit date: 01/07/2014  . Smokeless tobacco: Never Used  . Alcohol Use: No    Allergies:  Allergies  Allergen Reactions  . Gabapentin Other (See Comments)    PATIENT WAS UNRESPONSIVE AFTER TAKING MEDICATION, BUT VITALS REMAINED STABLE  . Metoclopramide Hcl Anxiety    Patient felt like she was trapped in a box, and cant get out.  Temporary loss of movement, weakness, tingling.    . Topamax [Topiramate] Shortness Of Breath and Other (See Comments)    Adverse Side effects, changed taste for some time, numbness and tingling in extremities.    . Codeine Nausea And Vomiting  . Latex Rash  . Tape Itching and Rash    Adhesive   Prolonged skin rash and itching.      Meds:  Prescriptions prior to admission  Medication Sig Dispense Refill  . acetaminophen (TYLENOL) 500 MG tablet Take 1,000 mg by mouth every 6 (six) hours as needed for pain.      . Ascorbic Acid (VITAMIN C) 100 MG CHEW Chew 2 tablets by mouth daily.      Marland Kitchen BIOTIN PO Take 1 tablet by mouth daily.       . butalbital-acetaminophen-caffeine (FIORICET, ESGIC) 50-325-40 MG per tablet TAKE 1 TO 2 TABLETS EVERY 6 HOURS AS NEEDED.  20 tablet  1  . calcium carbonate (TUMS - DOSED IN MG ELEMENTAL CALCIUM) 500 MG chewable tablet  Chew 3 tablets by mouth daily as needed for indigestion or heartburn.      . Cholecalciferol (VITAMIN D3) 2000 UNITS TABS Take 1 tablet by mouth daily.      . clobetasol ointment (TEMOVATE) 9.51 % Apply 1 application topically 2 (two) times daily. Apply to affected area  30 g  2  . clonazePAM (KLONOPIN) 0.5  MG tablet Take 1 tablet (0.5 mg total) by mouth 2 (two) times daily as needed for anxiety.  30 tablet  1  . cyclobenzaprine (FLEXERIL) 10 MG tablet Take 1 tablet (10 mg total) by mouth 2 (two) times daily as needed for muscle spasms.  30 tablet  1  . docusate sodium (COLACE) 100 MG capsule Take 100 mg by mouth 2 (two) times daily.      Marland Kitchen escitalopram (LEXAPRO) 20 MG tablet Take 1 tablet (20 mg total) by mouth daily.  30 tablet  11  . ferrous fumarate (HEMOCYTE - 106 MG FE) 325 (106 FE) MG TABS tablet Take 1 tablet by mouth.      . fluconazole (DIFLUCAN) 150 MG tablet Take 1 tablet (150 mg total) by mouth daily.  1 tablet  1  . hydrOXYzine (VISTARIL) 25 MG capsule TAKE ONE CAPSULE BY MOUTH TWICE DAILY AS NEEDED.  30 capsule  1  . Loratadine (CLARITIN PO) Take 1 tablet by mouth daily.      . meclizine (ANTIVERT) 25 MG tablet Take 1 tablet (25 mg total) by mouth 3 (three) times daily as needed for dizziness.  30 tablet  0  . Multiple Vitamins-Minerals (ZINC PO) Take by mouth daily.      Marland Kitchen nystatin (MYCOSTATIN) 100000 UNIT/ML suspension TAKE 5 MLS BY MOUTH FOUR TIMES DAILY.  140 mL  3  . OVER THE COUNTER MEDICATION Take 1 tablet by mouth daily. Potassium, magnesium, calcium combination.      . Phenazopyridine HCl (AZO-STANDARD PO) Take 2 tablets by mouth as needed. Urinary discomfort      . polyethylene glycol (MIRALAX / GLYCOLAX) packet Take 17 g by mouth daily as needed. constipation      . potassium chloride (K-DUR) 10 MEQ tablet Take 1 tablet (10 mEq total) by mouth daily.  30 tablet  11  . Prenatal Vit-Fe Fumarate-FA (PRENATAL MULTIVITAMIN) TABS Take 1 tablet by mouth at bedtime.       .  promethazine (PHENERGAN) 25 MG tablet Take 1 tablet (25 mg total) by mouth every 6 (six) hours as needed for nausea.  22 tablet  3  . PSEUDOEPHEDRINE HCL PO Take 1 tablet by mouth as needed (allergies).      Marland Kitchen tiZANidine (ZANAFLEX) 4 MG capsule 1 twice daily as needed  30 capsule  0    ROS: Pertinent findings in history of present illness.  Physical Exam  Blood pressure 127/84, pulse 119, temperature 98.1 F (36.7 C), temperature source Oral, resp. rate 16, last menstrual period 11/19/2013.  Filed Vitals:   07/07/14 2145 07/07/14 2203 07/07/14 2205 07/07/14 2207  BP: 127/84 135/89 130/99 133/82  Pulse: 119 111 110 123  Temp:      TempSrc:      Resp:       GENERAL: Obese female in apparent discomfort when moving in bed HEENT: normocephalic HEART: tachycardia, regular rhythm, no murmur RESP: normal effort, CTA bilat ABDOMEN: Soft, non-tender, gravid appropriate for gestational age BACK: neg CVAT EXTREMITIES: Nontender, no edema NEURO: alert and oriented Dilation: Closed Effacement (%): Thick Station: Ballotable Presentation: Vertex Exam by:: D. Shakirra Buehler CNM     FHT:  Baseline 170 , moderate variability, accelerations present, no decelerations Contractions: none    Labs: Results for orders placed during the hospital encounter of 07/07/14 (from the past 24 hour(s))  URINALYSIS, ROUTINE W REFLEX MICROSCOPIC     Status: Abnormal   Collection Time    07/07/14  9:20 PM  Result Value Ref Range   Color, Urine YELLOW  YELLOW   APPearance CLEAR  CLEAR   Specific Gravity, Urine 1.010  1.005 - 1.030   pH 6.0  5.0 - 8.0   Glucose, UA NEGATIVE  NEGATIVE mg/dL   Hgb urine dipstick NEGATIVE  NEGATIVE   Bilirubin Urine NEGATIVE  NEGATIVE   Ketones, ur 40 (*) NEGATIVE mg/dL   Protein, ur NEGATIVE  NEGATIVE mg/dL   Urobilinogen, UA 0.2  0.0 - 1.0 mg/dL   Nitrite NEGATIVE  NEGATIVE   Leukocytes, UA SMALL (*) NEGATIVE  URINE MICROSCOPIC-ADD ON     Status: Abnormal   Collection Time     07/07/14  9:20 PM      Result Value Ref Range   Squamous Epithelial / LPF FEW (*) RARE   WBC, UA 0-2  <3 WBC/hpf   RBC / HPF 0-2  <3 RBC/hpf   Bacteria, UA RARE  RARE  CBC     Status: Abnormal   Collection Time    07/07/14 10:20 PM      Result Value Ref Range   WBC 10.7 (*) 4.0 - 10.5 K/uL   RBC 3.74 (*) 3.87 - 5.11 MIL/uL   Hemoglobin 12.2  12.0 - 15.0 g/dL   HCT 34.4 (*) 36.0 - 46.0 %   MCV 92.0  78.0 - 100.0 fL   MCH 32.6  26.0 - 34.0 pg   MCHC 35.5  30.0 - 36.0 g/dL   RDW 12.3  11.5 - 15.5 %   Platelets 229  150 - 400 K/uL  COMPREHENSIVE METABOLIC PANEL     Status: Abnormal   Collection Time    07/07/14 10:20 PM      Result Value Ref Range   Sodium 139  137 - 147 mEq/L   Potassium 3.9  3.7 - 5.3 mEq/L   Chloride 104  96 - 112 mEq/L   CO2 18 (*) 19 - 32 mEq/L   Glucose, Bld 82  70 - 99 mg/dL   BUN 5 (*) 6 - 23 mg/dL   Creatinine, Ser 0.47 (*) 0.50 - 1.10 mg/dL   Calcium 8.8  8.4 - 10.5 mg/dL   Total Protein 6.2  6.0 - 8.3 g/dL   Albumin 3.0 (*) 3.5 - 5.2 g/dL   AST 13  0 - 37 U/L   ALT 9  0 - 35 U/L   Alkaline Phosphatase 145 (*) 39 - 117 U/L   Total Bilirubin 0.5  0.3 - 1.2 mg/dL   GFR calc non Af Amer >90  >90 mL/min   GFR calc Af Amer >90  >90 mL/min   Anion gap 17 (*) 5 - 15    Imaging:  US Ob Follow Up  06/15/2014   FOLLOW UP SONOGRAM   Forest L Mahmood is in the office for a follow up sonogram for check growth  and fluid.  She is a 28 y.o. year old G75P1011 with Estimated Date of Delivery: 09/02/14   now at  [redacted]w[redacted]d weeks gestation. Thus far the pregnancy has been  complicated by h/o gastric bypass.  GESTATION: SINGLETON  PRESENTATION: cephalic  FETAL ACTIVITY:          Heart rate         148 bpm          The fetus is active.  AMNIOTIC FLUID: The amniotic fluid volume is  normal, SDP-8.0 cm.  PLACENTA LOCALIZATION:  anterior, Lt lateral  GRADE 1  CERVIX: Unable to view  to measure due to fetal position ADNEXA: The adnexa appears normal.   GESTATIONAL AGE AND   BIOMETRICS:  Gestational criteria: Estimated Date of Delivery: 09/02/14  now at [redacted]w[redacted]d  Previous Scans:  GESTATIONAL SAC            mm          weeks  CROWN RUMP LENGTH            mm          weeks  NUCHAL TRANSLUCENCY            mm           BIPARIETAL DIAMETER           7.31 cm         29+2 weeks  HEAD CIRCUMFERENCE           26.48 cm         28+6 weeks  ABDOMINAL CIRCUMFERENCE           24.33 cm         28+4 weeks  FEMUR LENGTH           4.98 cm         26+6 weeks                                                           AVERAGE EGA(BY THIS SCAN):   28+3 weeks                                                 ESTIMATED FETAL WEIGHT:        1173  grams, 43 % ANATOMICAL SURVEY                                                                             COMMENTS CEREBRAL VENTRICLES yes normal   CHOROID PLEXUS yes normal   CEREBELLUM yes normal   CISTERNA MAGNA yes normal   NUCHAL REGION yes normal   ORBITS yes normal   NASAL BONE yes normal   NOSE/LIP yes normal   FACIAL PROFILE yes normal   4 CHAMBERED HEART no    OUTFLOW TRACTS no    DIAPHRAGM yes normal   STOMACH yes normal   RENAL REGION no    BLADDER yes normal   CORD INSERTION no    3 VESSEL CORD yes    SPINE no    ARMS/HANDS no normal   LEGS/FEET no    GENITALIA no  female        SUSPECTED ABNORMALITIES:  no  QUALITY OF SCAN: satisfactory  TECHNICIAN COMMENTS:  U/S(28+4wks)-single active vtx fetus, approp growth EFW 2 lb 9 oz  (43rd%tile), fluid WNL SDP-8.0cm, FHR- 148 bpm, female fetus, succ. Lobe  of placenta noted, anterior/Lt lateral Gr 1 placenta  A copy of this report including all images has been saved and backed  up to  a second source for retrieval if needed. All measures and details of the  anatomical scan, placentation, fluid volume and pelvic anatomy are  contained in that report.  Lazarus Gowda 06/14/2014 10:01 AM  Clinical Impression and recommendations:  I have reviewed the sonogram results above, combined with the patient's  current clinical course,  below are my impressions and any appropriate  recommendations for management based on the sonographic findings.   1.  [id number] Estimated Date of Delivery: 09/02/14 by  early ultrasound and  confirmed by today's sonographic dating 2.  Normal fetal sonographic findings, Specifically normal detailed  anatomical evaluation, now at 43 %ile 3.  Normal general sonographic findings,with normal AF volume.  Recommend routine prenatal care based on this sonogram or as clinically  indicated  FERGUSON,JOHN V 06/15/2014 12:57 PM        MAU Course: IV LR 1000cc bolus Flexeril $RemoveBefore'10mg'[id number]$  po at 2315 BPP ordered and care assumed by [facility] CNM and Dr. Kennon Rounds consulted Lorene Dy, CNM 07/09/2014 9:41 AM   Assessment:   Plan:     Lorene Dy, CNM 07/07/2014 9:59 PM

## 2014-07-07 NOTE — MAU Note (Signed)
Pt c/o ongoing low abd and low back that has worsened over the past three days along with dizziness that limits her daily activities.

## 2014-07-08 ENCOUNTER — Encounter (HOSPITAL_COMMUNITY): Payer: Self-pay | Admitting: Family

## 2014-07-08 ENCOUNTER — Observation Stay (HOSPITAL_COMMUNITY): Payer: Medicaid Other

## 2014-07-08 DIAGNOSIS — O99891 Other specified diseases and conditions complicating pregnancy: Secondary | ICD-10-CM | POA: Diagnosis not present

## 2014-07-08 DIAGNOSIS — O2390 Unspecified genitourinary tract infection in pregnancy, unspecified trimester: Secondary | ICD-10-CM | POA: Diagnosis present

## 2014-07-08 DIAGNOSIS — K7689 Other specified diseases of liver: Secondary | ICD-10-CM | POA: Diagnosis present

## 2014-07-08 DIAGNOSIS — O139 Gestational [pregnancy-induced] hypertension without significant proteinuria, unspecified trimester: Secondary | ICD-10-CM | POA: Insufficient documentation

## 2014-07-08 DIAGNOSIS — M545 Low back pain, unspecified: Secondary | ICD-10-CM | POA: Diagnosis present

## 2014-07-08 DIAGNOSIS — R030 Elevated blood-pressure reading, without diagnosis of hypertension: Secondary | ICD-10-CM | POA: Diagnosis present

## 2014-07-08 DIAGNOSIS — Z8249 Family history of ischemic heart disease and other diseases of the circulatory system: Secondary | ICD-10-CM | POA: Diagnosis not present

## 2014-07-08 DIAGNOSIS — Z87891 Personal history of nicotine dependence: Secondary | ICD-10-CM | POA: Diagnosis not present

## 2014-07-08 DIAGNOSIS — Z833 Family history of diabetes mellitus: Secondary | ICD-10-CM | POA: Diagnosis not present

## 2014-07-08 DIAGNOSIS — R109 Unspecified abdominal pain: Secondary | ICD-10-CM | POA: Diagnosis not present

## 2014-07-08 DIAGNOSIS — IMO0002 Reserved for concepts with insufficient information to code with codable children: Secondary | ICD-10-CM

## 2014-07-08 DIAGNOSIS — O99840 Bariatric surgery status complicating pregnancy, unspecified trimester: Secondary | ICD-10-CM | POA: Diagnosis present

## 2014-07-08 DIAGNOSIS — R197 Diarrhea, unspecified: Secondary | ICD-10-CM | POA: Diagnosis present

## 2014-07-08 DIAGNOSIS — R1084 Generalized abdominal pain: Secondary | ICD-10-CM

## 2014-07-08 DIAGNOSIS — K589 Irritable bowel syndrome without diarrhea: Secondary | ICD-10-CM | POA: Diagnosis present

## 2014-07-08 DIAGNOSIS — N3010 Interstitial cystitis (chronic) without hematuria: Secondary | ICD-10-CM | POA: Diagnosis present

## 2014-07-08 LAB — CBC
HEMATOCRIT: 31.2 % — AB (ref 36.0–46.0)
Hemoglobin: 10.8 g/dL — ABNORMAL LOW (ref 12.0–15.0)
MCH: 32.2 pg (ref 26.0–34.0)
MCHC: 34.6 g/dL (ref 30.0–36.0)
MCV: 93.1 fL (ref 78.0–100.0)
PLATELETS: 181 10*3/uL (ref 150–400)
RBC: 3.35 MIL/uL — AB (ref 3.87–5.11)
RDW: 12.3 % (ref 11.5–15.5)
WBC: 10.4 10*3/uL (ref 4.0–10.5)

## 2014-07-08 LAB — PROTEIN / CREATININE RATIO, URINE
Creatinine, Urine: 91.73 mg/dL
PROTEIN CREATININE RATIO: 0.1 (ref 0.00–0.15)
Total Protein, Urine: 9.4 mg/dL

## 2014-07-08 MED ORDER — DIPHENHYDRAMINE HCL 25 MG PO CAPS: 25.0000 mg | ORAL_CAPSULE | Freq: Once | ORAL | Status: DC

## 2014-07-08 MED ORDER — ESCITALOPRAM OXALATE 20 MG PO TABS
20.0000 mg | ORAL_TABLET | Freq: Every day | ORAL | Status: DC
  Administered 2014-07-08 – 2014-07-09 (×2): 20 mg via ORAL
  Filled 2014-07-08 (×3): qty 1

## 2014-07-08 MED ORDER — CALCIUM CARBONATE ANTACID 500 MG PO CHEW
2.0000 | CHEWABLE_TABLET | ORAL | Status: DC | PRN
  Administered 2014-07-08: 400 mg via ORAL
  Filled 2014-07-08: qty 2

## 2014-07-08 MED ORDER — DOCUSATE SODIUM 100 MG PO CAPS
100.0000 mg | ORAL_CAPSULE | Freq: Every day | ORAL | Status: DC
  Administered 2014-07-09: 100 mg via ORAL
  Filled 2014-07-08 (×3): qty 1

## 2014-07-08 MED ORDER — VITAMIN C 500 MG PO TABS
250.0000 mg | ORAL_TABLET | Freq: Every day | ORAL | Status: DC
  Administered 2014-07-08 – 2014-07-09 (×2): 250 mg via ORAL
  Filled 2014-07-08 (×3): qty 0.5

## 2014-07-08 MED ORDER — ZOLPIDEM TARTRATE 5 MG PO TABS: 5.0000 mg | ORAL_TABLET | Freq: Every evening | ORAL | Status: DC | PRN

## 2014-07-08 MED ORDER — ACETAMINOPHEN 325 MG PO TABS
650.0000 mg | ORAL_TABLET | ORAL | Status: DC | PRN
  Administered 2014-07-08 – 2014-07-09 (×2): 650 mg via ORAL
  Filled 2014-07-08 (×2): qty 2

## 2014-07-08 MED ORDER — CLONAZEPAM 0.5 MG PO TABS
0.5000 mg | ORAL_TABLET | Freq: Two times a day (BID) | ORAL | Status: DC | PRN
  Administered 2014-07-08 – 2014-07-09 (×2): 0.5 mg via ORAL
  Filled 2014-07-08 (×3): qty 1

## 2014-07-08 MED ORDER — HYDROMORPHONE HCL PF 1 MG/ML IJ SOLN
1.0000 mg | Freq: Once | INTRAMUSCULAR | Status: AC
  Administered 2014-07-08: 1 mg via INTRAVENOUS
  Filled 2014-07-08: qty 1

## 2014-07-08 MED ORDER — PRENATAL MULTIVITAMIN CH
1.0000 | ORAL_TABLET | Freq: Every day | ORAL | Status: DC
  Administered 2014-07-08 – 2014-07-09 (×2): 1 via ORAL
  Filled 2014-07-08 (×3): qty 1

## 2014-07-08 MED ORDER — HYDROMORPHONE HCL PF 1 MG/ML IJ SOLN
0.5000 mg | INTRAMUSCULAR | Status: DC | PRN
  Administered 2014-07-08: 0.5 mg via INTRAVENOUS
  Filled 2014-07-08: qty 1

## 2014-07-08 MED ORDER — LACTATED RINGERS IV SOLN
INTRAVENOUS | Status: DC
  Administered 2014-07-08 – 2014-07-09 (×4): via INTRAVENOUS

## 2014-07-08 MED ORDER — NYSTATIN 100000 UNIT/ML MT SUSP
5.0000 mL | Freq: Four times a day (QID) | OROMUCOSAL | Status: DC
  Administered 2014-07-08 – 2014-07-09 (×3): 500000 [IU] via ORAL
  Filled 2014-07-08 (×10): qty 5

## 2014-07-08 MED ORDER — PROMETHAZINE HCL 25 MG PO TABS
25.0000 mg | ORAL_TABLET | Freq: Four times a day (QID) | ORAL | Status: DC | PRN
  Administered 2014-07-08 – 2014-07-10 (×5): 25 mg via ORAL
  Filled 2014-07-08 (×5): qty 1

## 2014-07-08 MED ORDER — LACTATED RINGERS IV BOLUS (SEPSIS)
1000.0000 mL | Freq: Once | INTRAVENOUS | Status: AC
  Administered 2014-07-08: 1000 mL via INTRAVENOUS

## 2014-07-08 MED ORDER — VITAMIN D3 25 MCG (1000 UNIT) PO TABS
2000.0000 [IU] | ORAL_TABLET | Freq: Every day | ORAL | Status: DC
  Administered 2014-07-08 – 2014-07-09 (×2): 2000 [IU] via ORAL
  Filled 2014-07-08 (×3): qty 2

## 2014-07-08 MED ORDER — HYDROMORPHONE HCL PF 1 MG/ML IJ SOLN
1.0000 mg | INTRAMUSCULAR | Status: DC | PRN
  Administered 2014-07-08: 1 mg via INTRAVENOUS
  Filled 2014-07-08: qty 1

## 2014-07-08 MED ORDER — CALCIUM CARBONATE ANTACID 500 MG PO CHEW
3.0000 | CHEWABLE_TABLET | Freq: Every day | ORAL | Status: DC | PRN
  Filled 2014-07-08: qty 2

## 2014-07-08 MED ORDER — HYDROMORPHONE HCL 2 MG PO TABS
2.0000 mg | ORAL_TABLET | ORAL | Status: DC | PRN
  Administered 2014-07-08 – 2014-07-10 (×8): 2 mg via ORAL
  Filled 2014-07-08 (×8): qty 1

## 2014-07-08 NOTE — H&P (Signed)
Chief Complaint: No chief complaint on file.   First Provider Initiated Contact with Patient 07/07/14 2158  HPI: Penny [name] is a 28 y.o. G3P1011 at [redacted]w[redacted]d who presents to maternity admissions reporting worsening lower abdominal and low back pain as well as dizziness and fatigue. The LBP had responded to Zanaflex which puts her to sleep. It got worse 2 days ago when she ran out out of the Zanaflex. She has been having diarrhea "10" BMs per day for 3 weeks. She has chronic anemia and was transfused in first trimester. The abdominal pain is diffuse but more in lower abdomen and is worse with moving.  Denies contractions, leakage of fluid or vaginal bleeding. Good fetal movement.  Pregnancy Course: FT : Please see PL below. Normal 2 hrOGTT; [name] 43rd%ile at 28.4wks  Past Medical History:  Past Medical History   Diagnosis  Date   .  Migraines    .  Interstitial cystitis    .  IUD  FEB 2010   .  Gastritis  JULY 2011   .  Depression    .  Anemia  2011     2o to GASTRIC BYPASS   .  Fatty liver    .  Obesity (BMI 30-39.9)  2011 228 LBS BMI 36.8   .  Iron deficiency anemia  07/23/2010   .  Irritable bowel syndrome  2012 DIARRHEA     JUN 2012 TTG IgA 14.9   .  Elevated liver enzymes  JUL 2011ALK PHOS 111-127 AST 143-267 ALT 213-321T BILI 0.6 ALB 3.7-4.06 Jun 2011 ALK PHOS 118 AST 24 ALT 42 T BILI 0.4 ALB 3.9   .  Chronic daily headache    .  Ovarian cyst    .  PONV (postoperative nausea and vomiting)    .  ADD (attention deficit disorder)    .  RLQ abdominal pain  07/18/2013   .  Menorrhagia  07/18/2013   .  Anxiety    .  Potassium (K) deficiency    .  Dysmenorrhea  09/18/2013   .  Stress  09/18/2013   .  Patient desires pregnancy  09/18/2013   .  Pregnant  12/25/2013   .  Blood transfusion without reported diagnosis    .  Heart murmur     Patient Active Problem List    Diagnosis  Date Noted   .  Previous gastric bypass affecting pregnancy, antepartum  05/22/2014   .  Patent foramen  ovale with right to left shunt  05/17/2014   .  Depression complicating pregnancy in second trimester, antepartum  05/09/2014   .  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  Supervision of other high-risk pregnancy(V23.89)  02/18/2014   .  Rapid palpitations  02/18/2014   .  History of gestational hypertension  02/18/2014   .  H/O maternal third degree perineal laceration, currently pregnant  02/18/2014   .  Maternal iron deficiency anemia complicating pregnancy  81/19/1478   .  Restless legs  01/16/2014   .  Chronic interstitial cystitis  10/23/2013   .  Menorrhagia  07/18/2013   .  h/o Opiate addiction  03/11/2012     Class: Acute   .  Passive suicidal ideations  03/10/2012     Class: Acute   .  History of migraine headaches  03/10/2012     Class: Acute   .  Depression with anxiety  03/10/2012  Class: Chronic   .  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  ADD (attention deficit disorder) without hyperactivity  03/10/2012     Class: Chronic   .  IBS (irritable bowel syndrome)  08/25/2011   .  Diarrhea  05/27/2011   .  OBESITY, UNSPECIFIED  09/17/2010   .  LIVER FUNCTION TESTS, ABNORMAL, HX OF  09/17/2010   .  Iron deficiency anemia  07/23/2010    Past obstetric history:  OB History   Gravida  Para  Term  Preterm  AB  SAB  TAB  Ectopic  Multiple  Living   '3  1  1   1  1     1     '$ #  Outcome  Date  GA  Lbr Len/2nd  Weight  Sex  Delivery  Anes  PTL  Lv   3  CUR            2  TRM  2010  [redacted]w[redacted]d   3.572 kg (7 lb 14 oz)  M  SVD  EPI   Y   Comments: IOL d/t PIH   1  SAB               Past Surgical History:  Past Surgical History   Procedure  Laterality  Date   .  Gab   2007     in [place]-POUCH 5 CM   .  Cholecystectomy   2005     biliary dyskinesia   .  Cath self every nite       for sodium bicarb injection (discontinued 2013)   .  Colonoscopy   JUN 2012 ABD PN/DIARRHEA WITH PROPOFOL     NL  COLON   .  Upper gastrointestinal endoscopy   JULY 2011 NAUSEA-D125,V6, PH 25     Bx; GASTRITIS, POUCH-5 CM LONG   .  Dilation and curettage of uterus     .  Tonsillectomy     .  Gastric bypass   06/2006   .  [place] dilation   06/20/2012     TWS:FKCL gastritis/Ulcer in the mid jejunum   .  Tonsillectomy and adenoidectomy     .  Esophagogastroduodenoscopy     .  Wisdom tooth extraction      Family History:  Family History   Problem  Relation  Age of Onset   .  Hemochromatosis  Maternal Grandmother    .  Migraines  Maternal Grandmother    .  Cancer  Maternal Grandmother    .  Hypertension  Father    .  Diabetes  Father    .  Coronary artery disease  Father    .  Migraines  Paternal Grandmother    .  Cancer  Mother      breast   .  Hemochromatosis  Mother    .  Coronary artery disease  Paternal Grandfather     Social History:  History   Substance Use Topics   .  Smoking status:  Former Smoker -- 5 years     Types:  Cigarettes     Quit date:  01/07/2014   .  Smokeless tobacco:  Never Used   .  Alcohol Use:  No    Allergies:  Allergies   Allergen  Reactions   .  Gabapentin  Other (See Comments)     PATIENT WAS UNRESPONSIVE AFTER TAKING MEDICATION,  BUT VITALS REMAINED STABLE   .  Metoclopramide Hcl  Anxiety     Patient felt like she was trapped in a box, and cant get out. Temporary loss of movement, weakness, tingling.   .  Topamax [Topiramate]  Shortness Of Breath and Other (See Comments)     Adverse Side effects, changed taste for some time, numbness and tingling in extremities.   .  Codeine  Nausea And Vomiting   .  Latex  Rash   .  Tape  Itching and Rash     Adhesive Prolonged skin rash and itching.    Meds:  Prescriptions prior to admission   Medication  Sig  Dispense  Refill   .  acetaminophen (TYLENOL) 500 MG tablet  Take 1,000 mg by mouth every 6 (six) hours as needed for pain.     .  Ascorbic Acid (VITAMIN C) 100 MG CHEW  Chew 2 tablets by mouth daily.     Marland Kitchen   BIOTIN PO  Take 1 tablet by mouth daily.     .  butalbital-acetaminophen-caffeine (FIORICET, ESGIC) 50-325-40 MG per tablet  TAKE 1 TO 2 TABLETS EVERY 6 HOURS AS NEEDED.  20 tablet  1   .  calcium carbonate (TUMS - DOSED IN MG ELEMENTAL CALCIUM) 500 MG chewable tablet  Chew 3 tablets by mouth daily as needed for indigestion or heartburn.     .  Cholecalciferol (VITAMIN D3) 2000 UNITS TABS  Take 1 tablet by mouth daily.     .  clobetasol ointment (TEMOVATE) 1.30 %  Apply 1 application topically 2 (two) times daily. Apply to affected area  30 g  2   .  clonazePAM (KLONOPIN) 0.5 MG tablet  Take 1 tablet (0.5 mg total) by mouth 2 (two) times daily as needed for anxiety.  30 tablet  1   .  cyclobenzaprine (FLEXERIL) 10 MG tablet  Take 1 tablet (10 mg total) by mouth 2 (two) times daily as needed for muscle spasms.  30 tablet  1   .  docusate sodium (COLACE) 100 MG capsule  Take 100 mg by mouth 2 (two) times daily.     Marland Kitchen  escitalopram (LEXAPRO) 20 MG tablet  Take 1 tablet (20 mg total) by mouth daily.  30 tablet  11   .  ferrous fumarate (HEMOCYTE - 106 MG FE) 325 (106 FE) MG TABS tablet  Take 1 tablet by mouth.     .  fluconazole (DIFLUCAN) 150 MG tablet  Take 1 tablet (150 mg total) by mouth daily.  1 tablet  1   .  hydrOXYzine (VISTARIL) 25 MG capsule  TAKE ONE CAPSULE BY MOUTH TWICE DAILY AS NEEDED.  30 capsule  1   .  Loratadine (CLARITIN PO)  Take 1 tablet by mouth daily.     .  meclizine (ANTIVERT) 25 MG tablet  Take 1 tablet (25 mg total) by mouth 3 (three) times daily as needed for dizziness.  30 tablet  0   .  Multiple Vitamins-Minerals (ZINC PO)  Take by mouth daily.     Marland Kitchen  nystatin (MYCOSTATIN) 100000 UNIT/ML suspension  TAKE 5 MLS BY MOUTH FOUR TIMES DAILY.  140 mL  3   .  OVER THE COUNTER MEDICATION  Take 1 tablet by mouth daily. Potassium, magnesium, calcium combination.     .  Phenazopyridine HCl (AZO-STANDARD PO)  Take 2 tablets by mouth as needed. Urinary discomfort     .  polyethylene  glycol (  MIRALAX / GLYCOLAX) packet  Take 17 g by mouth daily as needed. constipation     .  potassium chloride (K-DUR) 10 MEQ tablet  Take 1 tablet (10 mEq total) by mouth daily.  30 tablet  11   .  Prenatal Vit-Fe Fumarate-FA (PRENATAL MULTIVITAMIN) TABS  Take 1 tablet by mouth at bedtime.     .  promethazine (PHENERGAN) 25 MG tablet  Take 1 tablet (25 mg total) by mouth every 6 (six) hours as needed for nausea.  22 tablet  3   .  PSEUDOEPHEDRINE HCL PO  Take 1 tablet by mouth as needed (allergies).     Marland Kitchen  tiZANidine (ZANAFLEX) 4 MG capsule  1 twice daily as needed  30 capsule  0    ROS: Pertinent findings in history of present illness.  Physical Exam   Blood pressure 127/84, pulse 119, temperature 98.1 F (36.7 C), temperature source Oral, resp. rate 16, last menstrual period 11/19/2013.  Filed Vitals:    07/07/14 2145  07/07/14 2203  07/07/14 2205  07/07/14 2207   BP:  127/84  135/89  130/99  133/82   Pulse:  119  111  110  123   Temp:       TempSrc:       Resp:        GENERAL: Obese female in apparent discomfort when moving in bed  HEENT: normocephalic  HEART: tachycardia, regular rhythm, no murmur  RESP: normal effort, CTA bilat  ABDOMEN: Soft, non-tender, gravid appropriate for gestational age  BACK: neg CVAT  EXTREMITIES: Nontender, no edema  NEURO: alert and oriented  Dilation: Closed  Effacement (%): Thick  Station: Ballotable  Presentation: Vertex  Exam by:: D. Poe CNM   FHT: Baseline 170 , moderate variability, accelerations present, no decelerations  Contractions: none  Labs:  Results for orders placed during the hospital encounter of 07/07/14 (from the past 24 hour(s))   URINALYSIS, ROUTINE W REFLEX MICROSCOPIC Status: Abnormal    Collection Time    07/07/14 9:20 PM   Result  Value  Ref Range    Color, Urine  YELLOW  YELLOW    APPearance  CLEAR  CLEAR    Specific Gravity, Urine  1.010  1.005 - 1.030    pH  6.0  5.0 - 8.0    Glucose, UA  NEGATIVE  NEGATIVE  mg/dL    Hgb urine dipstick  NEGATIVE  NEGATIVE    Bilirubin Urine  NEGATIVE  NEGATIVE    Ketones, ur  40 (*)  NEGATIVE mg/dL    Protein, ur  NEGATIVE  NEGATIVE mg/dL    Urobilinogen, UA  0.2  0.0 - 1.0 mg/dL    Nitrite  NEGATIVE  NEGATIVE    Leukocytes, UA  SMALL (*)  NEGATIVE   URINE MICROSCOPIC-ADD ON Status: Abnormal    Collection Time    07/07/14 9:20 PM   Result  Value  Ref Range    Squamous Epithelial / LPF  FEW (*)  RARE    WBC, UA  0-2  <3 WBC/hpf    RBC / HPF  0-2  <3 RBC/hpf    Bacteria, UA  RARE  RARE   CBC Status: Abnormal    Collection Time    07/07/14 10:20 PM   Result  Value  Ref Range    WBC  10.7 (*)  4.0 - 10.5 K/uL    RBC  3.74 (*)  3.87 - 5.11 MIL/uL    Hemoglobin  12.2  12.0 - 15.0 g/dL    HCT  34.4 (*)  36.0 - 46.0 %    MCV  92.0  78.0 - 100.0 fL    MCH  32.6  26.0 - 34.0 pg    MCHC  35.5  30.0 - 36.0 g/dL    RDW  12.3  11.5 - 15.5 %    Platelets  229  150 - 400 K/uL   COMPREHENSIVE METABOLIC PANEL Status: Abnormal    Collection Time    07/07/14 10:20 PM   Result  Value  Ref Range    Sodium  139  137 - 147 mEq/L    Potassium  3.9  3.7 - 5.3 mEq/L    Chloride  104  96 - 112 mEq/L    CO2  18 (*)  19 - 32 mEq/L    Glucose, Bld  82  70 - 99 mg/dL    BUN  5 (*)  6 - 23 mg/dL    Creatinine, Ser  0.47 (*)  0.50 - 1.10 mg/dL    Calcium  8.8  8.4 - 10.5 mg/dL    Total Protein  6.2  6.0 - 8.3 g/dL    Albumin  3.0 (*)  3.5 - 5.2 g/dL    AST  13  0 - 37 U/L    ALT  9  0 - 35 U/L    Alkaline Phosphatase  145 (*)  39 - 117 U/L    Total Bilirubin  0.5  0.3 - 1.2 mg/dL    GFR calc non Af Amer  >90  >90 mL/min    GFR calc Af Amer  >90  >90 mL/min    Anion gap  17 (*)  5 - 15    Imaging:  US Ob Follow Up  06/15/2014 FOLLOW UP SONOGRAM Penny [name] is in the office for a follow up sonogram for check growth and fluid. She is a 28 y.o. year old G74P1011 with Estimated Date of Delivery: 09/02/14 now at [redacted]w[redacted]d weeks gestation. Thus far the pregnancy has been  complicated by h/o gastric bypass. GESTATION: SINGLETON PRESENTATION: cephalic FETAL ACTIVITY: Heart rate 148 bpm The fetus is active. AMNIOTIC FLUID: The amniotic fluid volume is normal, SDP-8.0 cm. PLACENTA LOCALIZATION: anterior, Lt lateral GRADE 1 CERVIX: Unable to view to measure due to fetal position ADNEXA: The adnexa appears normal. GESTATIONAL AGE AND BIOMETRICS: Gestational criteria: Estimated Date of Delivery: 09/02/14 now at [redacted]w[redacted]d Previous Scans: GESTATIONAL SAC mm weeks CROWN RUMP LENGTH mm weeks NUCHAL TRANSLUCENCY mm BIPARIETAL DIAMETER 7.31 cm 29+2 weeks HEAD CIRCUMFERENCE 26.48 cm 28+6 weeks ABDOMINAL CIRCUMFERENCE 24.33 cm 28+4 weeks FEMUR LENGTH 4.98 cm 26+6 weeks AVERAGE EGA(BY THIS SCAN): 28+3 weeks ESTIMATED FETAL WEIGHT: 1173 grams, 43 % ANATOMICAL SURVEY COMMENTS CEREBRAL VENTRICLES yes normal CHOROID PLEXUS yes normal CEREBELLUM yes normal CISTERNA MAGNA yes normal NUCHAL REGION yes normal ORBITS yes normal NASAL BONE yes normal NOSE/LIP yes normal FACIAL PROFILE yes normal 4 CHAMBERED HEART no OUTFLOW TRACTS no DIAPHRAGM yes normal STOMACH yes normal RENAL REGION no BLADDER yes normal CORD INSERTION no 3 VESSEL CORD yes SPINE no ARMS/HANDS no normal LEGS/FEET no GENITALIA no female SUSPECTED ABNORMALITIES: no QUALITY OF SCAN: satisfactory TECHNICIAN COMMENTS: U/S(28+4wks)-single active vtx fetus, approp growth EFW 2 lb 9 oz (43rd%tile), fluid WNL SDP-8.0cm, FHR- 148 bpm, female fetus, succ. Lobe of placenta noted, anterior/Lt lateral Gr 1 placenta A copy of this report including all images has been saved and backed up to a second source  for retrieval if needed. All measures and details of the anatomical scan, placentation, fluid volume and pelvic anatomy are contained in that report. Lazarus Gowda 06/14/2014 10:01 AM Clinical Impression and recommendations: I have reviewed the sonogram results above, combined with the patient's current clinical course, below are my impressions and any  appropriate recommendations for management based on the sonographic findings. 1. [id number] Estimated Date of Delivery: 09/02/14 by early ultrasound and confirmed by today's sonographic dating 2. Normal fetal sonographic findings, Specifically normal detailed anatomical evaluation, now at 43 %ile 3. Normal general sonographic findings,with normal AF volume. Recommend routine prenatal care based on this sonogram or as clinically indicated FERGUSON,JOHN V 06/15/2014 12:57 PM  MAU Course:  IV LR 1000cc bolus  Flexeril $RemoveB'10mg'[id number]$  po at 2315  bpp   Lorene Dy, CNM  07/07/2014  9:59 PM   2400 Report given to Reina Fuse, CNM who assumes care of patient.  0120 Pt back from ultrasound.  BPP 6/8; BP reported as 180/93.  Pt reports continued abdominal pain in the midpelvic area that's described as a colicky pain that radiates to lower back.  Denies UTI symptoms.   Loose stools have improved in past 2 days, going approximately 5x/day.  Characteristic and frequency different from IBS per patient.  +mild headache.  Denies epigastric pain or vision changes.    31 Consulted with Dr. Elly Modena > reviewed assessment/BPP/vitals>admit for obs, collect 24 hr, renal ultrasound and treat for pain.   Assessment:   28 yo G3P1011 at [redacted]w[redacted]d weeks IUP  Abdominal/Flank Pain in Pregnancy Elevated Blood Pressure Diarrhea  Plan:  Admit to antenatal for observation Collect 24 hr urine (protein) Collect stool culture Renal ultrasound Repeat CBC in AM (0600)  Gwen Pounds, CNM

## 2014-07-08 NOTE — Progress Notes (Signed)
Attestation of Attending Supervision of Advanced Practitioner (CNM/NP): Evaluation and management procedures were performed by the Advanced Practitioner under my supervision and collaboration.  I have reviewed the Advanced Practitioner's note and chart, and I agree with the management and plan.  Joene Gelder 07/08/2014 7:14 AM

## 2014-07-08 NOTE — Progress Notes (Signed)
Chief Complaint: No chief complaint on file.   First Provider Initiated Contact with Patient 07/07/14 2158  HPI: Penny [name] is a 28 y.o. G3P1011 at [redacted]w[redacted]d who presents to maternity admissions reporting worsening lower abdominal and low back pain as well as dizziness and fatigue. The LBP had responded to Zanaflex which puts her to sleep. It got worse 2 days ago when she ran out out of the Zanaflex. She has been having diarrhea "10" BMs per day for 3 weeks. She has chronic anemia and was transfused in first trimester. The abdominal pain is diffuse but more in lower abdomen and is worse with moving.  Denies contractions, leakage of fluid or vaginal bleeding. Good fetal movement.  Pregnancy Course: FT : Please see PL below. Normal 2 hrOGTT; [name] 43rd%ile at 28.4wks  Past Medical History:  Past Medical History   Diagnosis  Date   .  Migraines    .  Interstitial cystitis    .  IUD  FEB 2010   .  Gastritis  JULY 2011   .  Depression    .  Anemia  2011     2o to GASTRIC BYPASS   .  Fatty liver    .  Obesity (BMI 30-39.9)  2011 228 LBS BMI 36.8   .  Iron deficiency anemia  07/23/2010   .  Irritable bowel syndrome  2012 DIARRHEA     JUN 2012 TTG IgA 14.9   .  Elevated liver enzymes  JUL 2011ALK PHOS 111-127 AST 143-267 ALT 213-321T BILI 0.6 ALB 3.7-4.06 Jun 2011 ALK PHOS 118 AST 24 ALT 42 T BILI 0.4 ALB 3.9   .  Chronic daily headache    .  Ovarian cyst    .  PONV (postoperative nausea and vomiting)    .  ADD (attention deficit disorder)    .  RLQ abdominal pain  07/18/2013   .  Menorrhagia  07/18/2013   .  Anxiety    .  Potassium (K) deficiency    .  Dysmenorrhea  09/18/2013   .  Stress  09/18/2013   .  Patient desires pregnancy  09/18/2013   .  Pregnant  12/25/2013   .  Blood transfusion without reported diagnosis    .  Heart murmur     Patient Active Problem List    Diagnosis  Date Noted   .  Previous gastric bypass affecting pregnancy, antepartum  05/22/2014   .  Patent foramen  ovale with right to left shunt  05/17/2014   .  Depression complicating pregnancy in second trimester, antepartum  05/09/2014   .  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  Supervision of other high-risk pregnancy(V23.89)  02/18/2014   .  Rapid palpitations  02/18/2014   .  History of gestational hypertension  02/18/2014   .  H/O maternal third degree perineal laceration, currently pregnant  02/18/2014   .  Maternal iron deficiency anemia complicating pregnancy  93/81/8299   .  Restless legs  01/16/2014   .  Chronic interstitial cystitis  10/23/2013   .  Menorrhagia  07/18/2013   .  h/o Opiate addiction  03/11/2012     Class: Acute   .  Passive suicidal ideations  03/10/2012     Class: Acute   .  History of migraine headaches  03/10/2012     Class: Acute   .  Depression with anxiety  03/10/2012  Class: Chronic   .  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  ADD (attention deficit disorder) without hyperactivity  03/10/2012     Class: Chronic   .  IBS (irritable bowel syndrome)  08/25/2011   .  Diarrhea  05/27/2011   .  OBESITY, UNSPECIFIED  09/17/2010   .  LIVER FUNCTION TESTS, ABNORMAL, HX OF  09/17/2010   .  Iron deficiency anemia  07/23/2010    Past obstetric history:  OB History   Gravida  Para  Term  Preterm  AB  SAB  TAB  Ectopic  Multiple  Living   '3  1  1   1  1     1     '$ #  Outcome  Date  GA  Lbr Len/2nd  Weight  Sex  Delivery  Anes  PTL  Lv   3  CUR            2  TRM  2010  [redacted]w[redacted]d   3.572 kg (7 lb 14 oz)  M  SVD  EPI   Y   Comments: IOL d/t PIH   1  SAB               Past Surgical History:  Past Surgical History   Procedure  Laterality  Date   .  Gab   2007     in [place]-POUCH 5 CM   .  Cholecystectomy   2005     biliary dyskinesia   .  Cath self every nite       for sodium bicarb injection (discontinued 2013)   .  Colonoscopy   JUN 2012 ABD PN/DIARRHEA WITH PROPOFOL     NL  COLON   .  Upper gastrointestinal endoscopy   JULY 2011 NAUSEA-D125,V6, PH 25     Bx; GASTRITIS, POUCH-5 CM LONG   .  Dilation and curettage of uterus     .  Tonsillectomy     .  Gastric bypass   06/2006   .  [place] dilation   06/20/2012     VOJ:JKKX gastritis/Ulcer in the mid jejunum   .  Tonsillectomy and adenoidectomy     .  Esophagogastroduodenoscopy     .  Wisdom tooth extraction      Family History:  Family History   Problem  Relation  Age of Onset   .  Hemochromatosis  Maternal Grandmother    .  Migraines  Maternal Grandmother    .  Cancer  Maternal Grandmother    .  Hypertension  Father    .  Diabetes  Father    .  Coronary artery disease  Father    .  Migraines  Paternal Grandmother    .  Cancer  Mother      breast   .  Hemochromatosis  Mother    .  Coronary artery disease  Paternal Grandfather     Social History:  History   Substance Use Topics   .  Smoking status:  Former Smoker -- 5 years     Types:  Cigarettes     Quit date:  01/07/2014   .  Smokeless tobacco:  Never Used   .  Alcohol Use:  No    Allergies:  Allergies   Allergen  Reactions   .  Gabapentin  Other (See Comments)     PATIENT WAS UNRESPONSIVE AFTER TAKING MEDICATION,  BUT VITALS REMAINED STABLE   .  Metoclopramide Hcl  Anxiety     Patient felt like she was trapped in a box, and cant get out. Temporary loss of movement, weakness, tingling.   .  Topamax [Topiramate]  Shortness Of Breath and Other (See Comments)     Adverse Side effects, changed taste for some time, numbness and tingling in extremities.   .  Codeine  Nausea And Vomiting   .  Latex  Rash   .  Tape  Itching and Rash     Adhesive Prolonged skin rash and itching.    Meds:  Prescriptions prior to admission   Medication  Sig  Dispense  Refill   .  acetaminophen (TYLENOL) 500 MG tablet  Take 1,000 mg by mouth every 6 (six) hours as needed for pain.     .  Ascorbic Acid (VITAMIN C) 100 MG CHEW  Chew 2 tablets by mouth daily.     Marland Kitchen   BIOTIN PO  Take 1 tablet by mouth daily.     .  butalbital-acetaminophen-caffeine (FIORICET, ESGIC) 50-325-40 MG per tablet  TAKE 1 TO 2 TABLETS EVERY 6 HOURS AS NEEDED.  20 tablet  1   .  calcium carbonate (TUMS - DOSED IN MG ELEMENTAL CALCIUM) 500 MG chewable tablet  Chew 3 tablets by mouth daily as needed for indigestion or heartburn.     .  Cholecalciferol (VITAMIN D3) 2000 UNITS TABS  Take 1 tablet by mouth daily.     .  clobetasol ointment (TEMOVATE) 3.41 %  Apply 1 application topically 2 (two) times daily. Apply to affected area  30 g  2   .  clonazePAM (KLONOPIN) 0.5 MG tablet  Take 1 tablet (0.5 mg total) by mouth 2 (two) times daily as needed for anxiety.  30 tablet  1   .  cyclobenzaprine (FLEXERIL) 10 MG tablet  Take 1 tablet (10 mg total) by mouth 2 (two) times daily as needed for muscle spasms.  30 tablet  1   .  docusate sodium (COLACE) 100 MG capsule  Take 100 mg by mouth 2 (two) times daily.     Marland Kitchen  escitalopram (LEXAPRO) 20 MG tablet  Take 1 tablet (20 mg total) by mouth daily.  30 tablet  11   .  ferrous fumarate (HEMOCYTE - 106 MG FE) 325 (106 FE) MG TABS tablet  Take 1 tablet by mouth.     .  fluconazole (DIFLUCAN) 150 MG tablet  Take 1 tablet (150 mg total) by mouth daily.  1 tablet  1   .  hydrOXYzine (VISTARIL) 25 MG capsule  TAKE ONE CAPSULE BY MOUTH TWICE DAILY AS NEEDED.  30 capsule  1   .  Loratadine (CLARITIN PO)  Take 1 tablet by mouth daily.     .  meclizine (ANTIVERT) 25 MG tablet  Take 1 tablet (25 mg total) by mouth 3 (three) times daily as needed for dizziness.  30 tablet  0   .  Multiple Vitamins-Minerals (ZINC PO)  Take by mouth daily.     Marland Kitchen  nystatin (MYCOSTATIN) 100000 UNIT/ML suspension  TAKE 5 MLS BY MOUTH FOUR TIMES DAILY.  140 mL  3   .  OVER THE COUNTER MEDICATION  Take 1 tablet by mouth daily. Potassium, magnesium, calcium combination.     .  Phenazopyridine HCl (AZO-STANDARD PO)  Take 2 tablets by mouth as needed. Urinary discomfort     .  polyethylene  glycol (  MIRALAX / GLYCOLAX) packet  Take 17 g by mouth daily as needed. constipation     .  potassium chloride (K-DUR) 10 MEQ tablet  Take 1 tablet (10 mEq total) by mouth daily.  30 tablet  11   .  Prenatal Vit-Fe Fumarate-FA (PRENATAL MULTIVITAMIN) TABS  Take 1 tablet by mouth at bedtime.     .  promethazine (PHENERGAN) 25 MG tablet  Take 1 tablet (25 mg total) by mouth every 6 (six) hours as needed for nausea.  22 tablet  3   .  PSEUDOEPHEDRINE HCL PO  Take 1 tablet by mouth as needed (allergies).     Marland Kitchen  tiZANidine (ZANAFLEX) 4 MG capsule  1 twice daily as needed  30 capsule  0    ROS: Pertinent findings in history of present illness.  Physical Exam   Blood pressure 127/84, pulse 119, temperature 98.1 F (36.7 C), temperature source Oral, resp. rate 16, last menstrual period 11/19/2013.  Filed Vitals:    07/07/14 2145  07/07/14 2203  07/07/14 2205  07/07/14 2207   BP:  127/84  135/89  130/99  133/82   Pulse:  119  111  110  123   Temp:       TempSrc:       Resp:        GENERAL: Obese female in apparent discomfort when moving in bed  HEENT: normocephalic  HEART: tachycardia, regular rhythm, no murmur  RESP: normal effort, CTA bilat  ABDOMEN: Soft, non-tender, gravid appropriate for gestational age  BACK: neg CVAT  EXTREMITIES: Nontender, no edema  NEURO: alert and oriented  Dilation: Closed  Effacement (%): Thick  Station: Ballotable  Presentation: Vertex  Exam by:: D. Poe CNM   FHT: Baseline 170 , moderate variability, accelerations present, no decelerations  Contractions: none  Labs:  Results for orders placed during the hospital encounter of 07/07/14 (from the past 24 hour(s))   URINALYSIS, ROUTINE W REFLEX MICROSCOPIC Status: Abnormal    Collection Time    07/07/14 9:20 PM   Result  Value  Ref Range    Color, Urine  YELLOW  YELLOW    APPearance  CLEAR  CLEAR    Specific Gravity, Urine  1.010  1.005 - 1.030    pH  6.0  5.0 - 8.0    Glucose, UA  NEGATIVE  NEGATIVE  mg/dL    Hgb urine dipstick  NEGATIVE  NEGATIVE    Bilirubin Urine  NEGATIVE  NEGATIVE    Ketones, ur  40 (*)  NEGATIVE mg/dL    Protein, ur  NEGATIVE  NEGATIVE mg/dL    Urobilinogen, UA  0.2  0.0 - 1.0 mg/dL    Nitrite  NEGATIVE  NEGATIVE    Leukocytes, UA  SMALL (*)  NEGATIVE   URINE MICROSCOPIC-ADD ON Status: Abnormal    Collection Time    07/07/14 9:20 PM   Result  Value  Ref Range    Squamous Epithelial / LPF  FEW (*)  RARE    WBC, UA  0-2  <3 WBC/hpf    RBC / HPF  0-2  <3 RBC/hpf    Bacteria, UA  RARE  RARE   CBC Status: Abnormal    Collection Time    07/07/14 10:20 PM   Result  Value  Ref Range    WBC  10.7 (*)  4.0 - 10.5 K/uL    RBC  3.74 (*)  3.87 - 5.11 MIL/uL    Hemoglobin  12.2  12.0 - 15.0 g/dL    HCT  34.4 (*)  36.0 - 46.0 %    MCV  92.0  78.0 - 100.0 fL    MCH  32.6  26.0 - 34.0 pg    MCHC  35.5  30.0 - 36.0 g/dL    RDW  12.3  11.5 - 15.5 %    Platelets  229  150 - 400 K/uL   COMPREHENSIVE METABOLIC PANEL Status: Abnormal    Collection Time    07/07/14 10:20 PM   Result  Value  Ref Range    Sodium  139  137 - 147 mEq/L    Potassium  3.9  3.7 - 5.3 mEq/L    Chloride  104  96 - 112 mEq/L    CO2  18 (*)  19 - 32 mEq/L    Glucose, Bld  82  70 - 99 mg/dL    BUN  5 (*)  6 - 23 mg/dL    Creatinine, Ser  0.47 (*)  0.50 - 1.10 mg/dL    Calcium  8.8  8.4 - 10.5 mg/dL    Total Protein  6.2  6.0 - 8.3 g/dL    Albumin  3.0 (*)  3.5 - 5.2 g/dL    AST  13  0 - 37 U/L    ALT  9  0 - 35 U/L    Alkaline Phosphatase  145 (*)  39 - 117 U/L    Total Bilirubin  0.5  0.3 - 1.2 mg/dL    GFR calc non Af Amer  >90  >90 mL/min    GFR calc Af Amer  >90  >90 mL/min    Anion gap  17 (*)  5 - 15    Imaging:  US Ob Follow Up  06/15/2014 FOLLOW UP SONOGRAM Nicolena L Darko is in the office for a follow up sonogram for check growth and fluid. She is a 28 y.o. year old G39P1011 with Estimated Date of Delivery: 09/02/14 now at [redacted]w[redacted]d weeks gestation. Thus far the pregnancy has been  complicated by h/o gastric bypass. GESTATION: SINGLETON PRESENTATION: cephalic FETAL ACTIVITY: Heart rate 148 bpm The fetus is active. AMNIOTIC FLUID: The amniotic fluid volume is normal, SDP-8.0 cm. PLACENTA LOCALIZATION: anterior, Lt lateral GRADE 1 CERVIX: Unable to view to measure due to fetal position ADNEXA: The adnexa appears normal. GESTATIONAL AGE AND BIOMETRICS: Gestational criteria: Estimated Date of Delivery: 09/02/14 now at [redacted]w[redacted]d Previous Scans: GESTATIONAL SAC mm weeks CROWN RUMP LENGTH mm weeks NUCHAL TRANSLUCENCY mm BIPARIETAL DIAMETER 7.31 cm 29+2 weeks HEAD CIRCUMFERENCE 26.48 cm 28+6 weeks ABDOMINAL CIRCUMFERENCE 24.33 cm 28+4 weeks FEMUR LENGTH 4.98 cm 26+6 weeks AVERAGE EGA(BY THIS SCAN): 28+3 weeks ESTIMATED FETAL WEIGHT: 1173 grams, 43 % ANATOMICAL SURVEY COMMENTS CEREBRAL VENTRICLES yes normal CHOROID PLEXUS yes normal CEREBELLUM yes normal CISTERNA MAGNA yes normal NUCHAL REGION yes normal ORBITS yes normal NASAL BONE yes normal NOSE/LIP yes normal FACIAL PROFILE yes normal 4 CHAMBERED HEART no OUTFLOW TRACTS no DIAPHRAGM yes normal STOMACH yes normal RENAL REGION no BLADDER yes normal CORD INSERTION no 3 VESSEL CORD yes SPINE no ARMS/HANDS no normal LEGS/FEET no GENITALIA no female SUSPECTED ABNORMALITIES: no QUALITY OF SCAN: satisfactory TECHNICIAN COMMENTS: U/S(28+4wks)-single active vtx fetus, approp growth EFW 2 lb 9 oz (43rd%tile), fluid WNL SDP-8.0cm, FHR- 148 bpm, female fetus, succ. Lobe of placenta noted, anterior/Lt lateral Gr 1 placenta A copy of this report including all images has been saved and backed up to a second source  for retrieval if needed. All measures and details of the anatomical scan, placentation, fluid volume and pelvic anatomy are contained in that report. Lazarus Gowda 06/14/2014 10:01 AM Clinical Impression and recommendations: I have reviewed the sonogram results above, combined with the patient's current clinical course, below are my impressions and any  appropriate recommendations for management based on the sonographic findings. 1. [id number] Estimated Date of Delivery: 09/02/14 by early ultrasound and confirmed by today's sonographic dating 2. Normal fetal sonographic findings, Specifically normal detailed anatomical evaluation, now at 43 %ile 3. Normal general sonographic findings,with normal AF volume. Recommend routine prenatal care based on this sonogram or as clinically indicated FERGUSON,JOHN V 06/15/2014 12:57 PM  MAU Course:  IV LR 1000cc bolus  Flexeril $RemoveB'10mg'vDQDSLNH$  po at 2315  bpp   Lorene Dy, CNM  07/07/2014  9:59 PM   2400 Report given to Reina Fuse, CNM who assumes care of patient.  0120 Pt back from ultrasound.  BPP 6/8; BP reported as 180/93.  Pt reports continued abdominal pain in the midpelvic area that's described as a colicky pain that radiates to lower back.  Denies UTI symptoms.   Loose stools have improved in past 2 days, going approximately 5x/day.  Characteristic and frequency different from IBS per patient.  +mild headache.  Denies epigastric pain or vision changes.    51 Consulted with Dr. Elly Modena > reviewed assessment/BPP/vitals>admit for obs, collect 24 hr, renal ultrasound and treat for pain.   Assessment:   28 yo G3P1011 at [redacted]w[redacted]d weeks IUP  Abdominal/Flank Pain in Pregnancy Elevated Blood Pressure Diarrhea  Plan:  Admit to antenatal for observation Collect 24 hr urine (protein) Collect stool culture Renal ultrasound Repeat CBC in AM (0600)  Gwen Pounds, CNM

## 2014-07-08 NOTE — Progress Notes (Signed)
Attestation of Attending Supervision of Advanced Practitioner (CNM/NP): Evaluation and management procedures were performed by the Advanced Practitioner under my supervision and collaboration.  I have reviewed the Advanced Practitioner's note and chart, and I agree with the management and plan.  Shailyn Weyandt 07/08/2014 7:15 AM

## 2014-07-08 NOTE — H&P (Signed)
Attestation of Attending Supervision of Advanced Practitioner (CNM/NP): Evaluation and management procedures were performed by the Advanced Practitioner under my supervision and collaboration.  I have reviewed the Advanced Practitioner's note and chart, and I agree with the management and plan.  Gemma Ruan 07/08/2014 7:16 AM

## 2014-07-08 NOTE — Progress Notes (Signed)
Call by RN regarding fetal heart rate in 170's.  FHR in 170's beginning at approximately 0430.   Review of strip by Dr. Elly Modena >  plans to come evaluate patient on antenatal unit.

## 2014-07-09 DIAGNOSIS — O169 Unspecified maternal hypertension, unspecified trimester: Secondary | ICD-10-CM | POA: Diagnosis present

## 2014-07-09 LAB — CREATININE CLEARANCE, URINE, 24 HOUR
Collection Interval-CRCL: 24 hours
Creatinine Clearance: 202 mL/min — ABNORMAL HIGH (ref 75–115)
Creatinine, 24H Ur: 1366 mg/d (ref 700–1800)
Creatinine, Urine: 60.04 mg/dL
Creatinine: 0.47 mg/dL — ABNORMAL LOW (ref 0.50–1.10)
Urine Total Volume-CRCL: 2275 mL

## 2014-07-09 LAB — PROTEIN, URINE, 24 HOUR
Collection Interval-UPROT: 24 hours
PROTEIN 24H UR: 68 mg/d (ref 50–100)
PROTEIN, URINE: 3 mg/dL
URINE TOTAL VOLUME-UPROT: 2275 mL

## 2014-07-09 MED ORDER — PANTOPRAZOLE SODIUM 40 MG PO TBEC
40.0000 mg | DELAYED_RELEASE_TABLET | Freq: Every day | ORAL | Status: DC
  Administered 2014-07-09: 40 mg via ORAL
  Filled 2014-07-09: qty 1

## 2014-07-09 MED ORDER — CYCLOBENZAPRINE HCL 10 MG PO TABS
10.0000 mg | ORAL_TABLET | Freq: Three times a day (TID) | ORAL | Status: DC | PRN
  Administered 2014-07-09: 10 mg via ORAL
  Filled 2014-07-09: qty 1

## 2014-07-09 NOTE — Progress Notes (Addendum)
BP taken by Pt.'s husband with no nurse or staff member in the room. 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 Unsure of accuracy of BP cuff placement, and what was happening in the room

## 2014-07-09 NOTE — Progress Notes (Signed)
Patient ID: Penny [name], female   DOB: 09/27/1986, 28 y.o.   MRN: [id number] FACULTY PRACTICE ANTEPARTUM(COMPREHENSIVE) NOTE  Penny [name] is a 28 y.o. G3P1011 at [redacted]w[redacted]d by best clinical estimate who is admitted for abdominal pain and elevated BP.   Fetal presentation is unsure. Length of Stay:  2  Days  Subjective: Pain is better, has reflux  Patient reports the fetal movement as active. Patient reports uterine contraction  activity as none. Patient reports  vaginal bleeding as none. Patient describes fluid per vagina as None.  Vitals:  Blood pressure 150/87, pulse 95, temperature 98.4 F (36.9 C), temperature source Oral, resp. rate 18, height 5\' 6"  (1.676 m), weight 226 lb (102.513 kg), last menstrual period 11/19/2013, SpO2 99.00%. Physical Examination:  General appearance - alert, well appearing, and in no distress Heart - normal rate and regular rhythm Abdomen - soft, nontender, nondistended Fundal Height:  size equals dates Cervical Exam: Not evaluated.  Exremities: extremities normal, atraumatic, no cyanosis or edema  Membranes:intact  Fetal Monitoring:  Baseline: 160 bpm accels noted no decels  Labs:  No results found for this or any previous visit (from the past 24 hour(s)).  Imaging Studies:     Currently EPIC will not allow sonographic studies to automatically populate into notes.  In the meantime, copy and paste results into note or free text.  Medications:  Scheduled . cholecalciferol  2,000 Units Oral Daily  . diphenhydrAMINE  25 mg Oral Once  . docusate sodium  100 mg Oral Daily  . escitalopram  20 mg Oral Daily  . nystatin  5 mL Oral QID  . pantoprazole  40 mg Oral Daily  . prenatal multivitamin  1 tablet Oral Q1200  . vitamin C  250 mg Oral Daily   I have reviewed the patient's current medications.  ASSESSMENT: Patient Active Problem List   Diagnosis Date Noted  . Gestational hypertension 07/08/2014  . Previous gastric bypass affecting pregnancy,  antepartum 05/22/2014  . 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 Depression complicating pregnancy in second trimester, antepartum 05/09/2014  . Nuchal fold thickening on prental ultrasound 03/09/2014  . Supervision of other high-risk pregnancy(V23.89) 02/18/2014  . Rapid palpitations 02/18/2014  . History of gestational hypertension 02/18/2014  . H/O maternal third degree perineal laceration, currently pregnant 02/18/2014  . Maternal iron deficiency anemia complicating pregnancy 55/73/2202  . Restless legs 01/16/2014  . Chronic interstitial cystitis 10/23/2013  . Menorrhagia 07/18/2013  . h/o Opiate addiction 03/11/2012    Class: Acute  . Passive suicidal ideations 03/10/2012    Class: Acute  . History of migraine headaches 03/10/2012    Class: Acute  . Depression with anxiety 03/10/2012    Class: Chronic  . 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 IBS (irritable bowel syndrome) 08/25/2011  . Diarrhea 05/27/2011  . OBESITY, UNSPECIFIED 09/17/2010  . LIVER FUNCTION TESTS, ABNORMAL, HX OF 09/17/2010  . Iron deficiency anemia 07/23/2010    PLAN: Follow BP, treat reflux, f/u urine protein  Kamsiyochukwu Spickler 07/09/2014,9:54 AM

## 2014-07-09 NOTE — Progress Notes (Signed)
Clinical Social Work Department ANTENATAL PSYCHOSOCIAL ASSESSMENT 07/09/2014  Patient:  Penny [name], Penny [name]   Account Number:  [id number]  Admit Date:  07/07/2014     DOB:  01/14/1986   Age:  28 Gestational age on admission:  31     Expected delivery date:  09/02/2014 Admitting diagnosis:   Worsening lower abdominal and low back pain, dizziness and fatigue   Clinical Social Worker:  Lucita Ferrara,  CLINICAL SOCIAL WORKER  Date/Time:  07/09/2014 10:30 AM  FAMILY/HOME ENVIRONMENT  Home address:   [address]  [place], [place] [address]   Household Member/Support Name Relationship Age  Fisher SON 5   Other support:   MOB reported that her father and best friend are currently living in their home.     MOB and FOB shared that there are numerous family and friends who are supportive and offer assistance, but MOB shared that she often feels like a burden when she asks for help. Upon further exploration, MOB is aware that she is not a burden and she would help them out in times of need.   PSYCHOSOCIAL DATA  Information source:  Family Interview Other information source:    Resources:   MOB inquired about WIC.   Employment:   MOB and FOB reported that they both are employed within the [employer]. MOB shared that she has not been able to work since February due to her complicated pregnancy, and identified this as a significant stressor since she finds enjoyment and feels as if she "thrives" when she is working.   Medicaid (county):  [place]:   N/A Current grade:  N/A  Other Resourses  Medicaid  [place]   Cultural/Environmental issues impacting care:  None reported.   STRENGTHS / WEAKNESSES / FACTORS TO CONSIDER  Concerns related to hospitalization:   MOB reported anxiety related to returning home since she knows that it is difficult for her pain to be controlled once she goes home.  She also reported concerns about going home since she believes that  her anxiety/depression worsens while at home since she is inactive, is home alone, and often dwells on the complications related to the pregnancy and the physical pain. She also expressed belief that she cannot be the mother she wants to be for her 36 year old since he is very active and wants someone to play with.    Previous pregnancies/feelings towards pregnancy?  Concerns related to being/becoming a mother?   MOB reported that she had a miscarriage prior to her 28 year old son.  She stated that she has always experienced an increase in anxiety while pregnant out of fear that something bad will happen to the baby.   Social support (FOB? Who is/will be helping with baby/other kids)   FOB, best friends, and her siblings   Couples relationship:   She stated that the FOB is a "rock", offers her frequent emotional support and is able to help her calm her anxiety.   Recent stressful life events (life changes in past year?):   MOB shared that her parents separated 18 months ago, and discussed how it has been difficult for her to cope with this change.  She stated that she has been unable to work since February 2014 due to her pregnancy, and shared that she has learned during her pregnancy of "having a hole in my heart".   Prenatal care/education/home preparations?   Per FOB, home is prepared.   Domestic  violence (of any type):  N  Substance use during pregnancy.:  Y MOB admits to using her father's medications a couple of weeks to control her pain.   Follow up recommendations:   CSW discussed referrals for outpatient mental health treatment as MOB reflected upon belief that her symptoms have been triggered/intensified during the pregnancy.     CSW recommended daily self-care and increasing interaction with positive supports.   Patient advised/response?   MOB and FOB denied need to follow-up with outpaitent providers to address mental health needs at this time.  Per MOB, she is aware that  symptoms are worsening, but declined belief that it was to the point of needing outside assistance.  MOB admits to pain medication use 2 weeks ago (that were not prescribed to her), but shared that the FOB has removed all pain medications from the home and she is no longer tempted to use.   Clinical Assessment/Plan CSW met with MOB in her room to complete the assessment.  Consult ordered due to MOB's mental health history and due to MOB's questions related to [facility].  FOB present for the assessment, and MOB provided consent for him to be present.  MOB and FOB receptive to the assessment, and willing participated when feelings were processed.  Mood and affect appropriate for setting and conversation, MOB became tearful as she reflected upon psychosocial stressors and current medical conditions complicating her pregnancy.   MOB discussed ongoing challenges related to complicated pregnancy.  She reflected upon receiving blood transfusion early in pregnancy, being unable to work since February, and now experiencing large amounts of physical pain.  She acknowledges mental health history which includes depression, anxiety, and opiate abuse, and understands how this had lead to providers being hesitant to prescribe medications to her.  MOB expressed challenges of being in pain as she is unable to actively parent her 70 year old.  MOB shared that she often feels like a burden if she reaches out to her support system to help her with childcare, despite their ongoing reports of wanting to help. She shared that she has recently become more anxious about going into pre-term labor which may negatively impact her baby.    CSW validated and normalized MOB's feelings throughout.  CSW de-catastrophized anxieties, and continued to explore MOB's personal values.  MOB confirms that it is very important to work and to be a "good mother".  CSW explored with MOB the challenges in being a good mother as her 40 year old and unborn baby  have needs that are very different (27 year old wants someone active and unborn baby requires MOB to be resting).  CSW explored with MOB how values can guide the decision making process, including needing to make decisions that will allow her to someday work again and that will allow her to be a "good mother".  MOB receptive to the feedback, and acknowledged that it is important for her to take care of herself so that she has a healthy baby and fewer health complications.    CSW reviewed mental health history, and MOB admits to previous history of depression and anxiety.  She stated that there is a family history of depression, but she did not experience symptoms until after her 27 year old was born.  MOB shared that she was admitted to [facility] in April 2013 due to SI (no attempt).  She stated that she followed-up with [facility], and discussed how it helped her.  CSW reviewed previous symptoms  and her current symptoms.  She acknowledges that her symptoms had stabilized, but are now worsening during this pregnancy due to the complications.  CSW explored with MOB how her mental health symptoms are impacting her ability to be the mother she wants to be.  She is aware that symptoms led to her take unprescribed pain medications 2 weeks ago, which is not beneficial to herself, her 27 year old, and her unborn baby.  FOB stated that he has since intervened and removed all access to pain medications since he fears a downward spiral.  CSW reviewed MOB's statements that symptoms are negatively impacting her ability to work, and discussed re-referral for services. MOB declined request as she stated that she and FOB will know "when it is time".  CSW encouraged family to identify "when it is time" before crisis occurs.  Family willing.  CSW explored with MOB and FOB how to reduce anxiety by focusing on one day at a time.  CSW validated challenges related to feeling like the pain and pregnancy will last forever, and  encouraged MOB to reflect upon all of her progress and gains thus far.  MOB smiled as her resilience was highlighted.   Family aware of ongoing support via CSW during admission.

## 2014-07-09 NOTE — Progress Notes (Signed)
UR completed 

## 2014-07-09 NOTE — MAU Provider Note (Signed)
Attestation of Attending Supervision of Advanced Practitioner (CNM/NP): Evaluation and management procedures were performed by the Advanced Practitioner under my supervision and collaboration.  I have reviewed the Advanced Practitioner's note and chart, and I agree with the management and plan.  Kruz Chiu 07/09/2014 10:54 AM

## 2014-07-10 ENCOUNTER — Other Ambulatory Visit: Payer: Self-pay | Admitting: Obstetrics & Gynecology

## 2014-07-10 DIAGNOSIS — O139 Gestational [pregnancy-induced] hypertension without significant proteinuria, unspecified trimester: Secondary | ICD-10-CM

## 2014-07-10 MED ORDER — CYCLOBENZAPRINE HCL 10 MG PO TABS: 10.0000 mg | ORAL_TABLET | Freq: Three times a day (TID) | ORAL | Status: DC | PRN

## 2014-07-10 NOTE — Discharge Summary (Addendum)
Antenatal Physician Discharge Summary  Patient ID: Penny [name] MRN: [id number] DOB/AGE: August 17, 1986 28 y.o.  Admit date: 07/07/2014 Discharge date: 07/10/2014  Admission Diagnoses: Abdominal pain, elevated BP  Discharge Diagnoses: Same  Prenatal Procedures: NST  Consults: Neonatology, Maternal Fetal Medicine  Significant Diagnostic Studies:  Results for orders placed during the hospital encounter of 07/07/14 (from the past 168 hour(s))  URINALYSIS, ROUTINE W REFLEX MICROSCOPIC   Collection Time    07/07/14  9:20 PM      Result Value Ref Range   Color, Urine YELLOW  YELLOW   APPearance CLEAR  CLEAR   Specific Gravity, Urine 1.010  1.005 - 1.030   pH 6.0  5.0 - 8.0   Glucose, UA NEGATIVE  NEGATIVE mg/dL   Hgb urine dipstick NEGATIVE  NEGATIVE   Bilirubin Urine NEGATIVE  NEGATIVE   Ketones, ur 40 (*) NEGATIVE mg/dL   Protein, ur NEGATIVE  NEGATIVE mg/dL   Urobilinogen, UA 0.2  0.0 - 1.0 mg/dL   Nitrite NEGATIVE  NEGATIVE   Leukocytes, UA SMALL (*) NEGATIVE  URINE MICROSCOPIC-ADD ON   Collection Time    07/07/14  9:20 PM      Result Value Ref Range   Squamous Epithelial / LPF FEW (*) RARE   WBC, UA 0-2  <3 WBC/hpf   RBC / HPF 0-2  <3 RBC/hpf   Bacteria, UA RARE  RARE  PROTEIN / CREATININE RATIO, URINE   Collection Time    07/07/14  9:20 PM      Result Value Ref Range   Creatinine, Urine 91.73     Total Protein, Urine 9.4     PROTEIN CREATININE RATIO 0.10  0.00 - 0.15  CBC   Collection Time    07/07/14 10:20 PM      Result Value Ref Range   WBC 10.7 (*) 4.0 - 10.5 K/uL   RBC 3.74 (*) 3.87 - 5.11 MIL/uL   Hemoglobin 12.2  12.0 - 15.0 g/dL   HCT 34.4 (*) 36.0 - 46.0 %   MCV 92.0  78.0 - 100.0 fL   MCH 32.6  26.0 - 34.0 pg   MCHC 35.5  30.0 - 36.0 g/dL   RDW 12.3  11.5 - 15.5 %   Platelets 229  150 - 400 K/uL  COMPREHENSIVE METABOLIC PANEL   Collection Time    07/07/14 10:20 PM      Result Value Ref Range   Sodium 139  137 - 147 mEq/L   Potassium 3.9  3.7 -  5.3 mEq/L   Chloride 104  96 - 112 mEq/L   CO2 18 (*) 19 - 32 mEq/L   Glucose, Bld 82  70 - 99 mg/dL   BUN 5 (*) 6 - 23 mg/dL   Creatinine, Ser 0.47 (*) 0.50 - 1.10 mg/dL   Calcium 8.8  8.4 - 10.5 mg/dL   Total Protein 6.2  6.0 - 8.3 g/dL   Albumin 3.0 (*) 3.5 - 5.2 g/dL   AST 13  0 - 37 U/L   ALT 9  0 - 35 U/L   Alkaline Phosphatase 145 (*) 39 - 117 U/L   Total Bilirubin 0.5  0.3 - 1.2 mg/dL   GFR calc non Af Amer >90  >90 mL/min   GFR calc Af Amer >90  >90 mL/min   Anion gap 17 (*) 5 - 15  PROTEIN, URINE, 24 HOUR   Collection Time    07/08/14  5:00 AM      Result  Value Ref Range   Urine Total Volume-UPROT 2275     Collection Interval-UPROT 24     Protein, Urine 3     Protein, 24H Urine 68  50 - 100 mg/day  CREATININE CLEARANCE, URINE, 24 HOUR   Collection Time    07/08/14  5:00 AM      Result Value Ref Range   Urine Total Volume-CRCL 2275     Collection Interval-CRCL 24     Creatinine, Urine 60.04     Creatinine 0.47 (*) 0.50 - 1.10 mg/dL   Creatinine, 24H Ur 1366  700 - 1800 mg/day   Creatinine Clearance 202 (*) 75 - 115 mL/min  CBC   Collection Time    07/08/14  5:50 AM      Result Value Ref Range   WBC 10.4  4.0 - 10.5 K/uL   RBC 3.35 (*) 3.87 - 5.11 MIL/uL   Hemoglobin 10.8 (*) 12.0 - 15.0 g/dL   HCT 31.2 (*) 36.0 - 46.0 %   MCV 93.1  78.0 - 100.0 fL   MCH 32.2  26.0 - 34.0 pg   MCHC 34.6  30.0 - 36.0 g/dL   RDW 12.3  11.5 - 15.5 %   Platelets 181  150 - 400 K/uL  Results for orders placed in visit on 07/03/14 (from the past 168 hour(s))  POCT URINALYSIS DIPSTICK   Collection Time    07/03/14  4:41 PM      Result Value Ref Range   Color, UA       Clarity, UA       Glucose, UA neg     Bilirubin, UA       Ketones, UA neg     Spec Grav, UA       Blood, UA neg     pH, UA       Protein, UA neg     Urobilinogen, UA       Nitrite, UA neg     Leukocytes, UA Negative      Treatments: analgesia: Dilaudid  Hospital Course:  This is a 28 y.o. G3P1011  with IUP at [redacted]w[redacted]d admitted for abdominal pain.  Long h/o pain, IBS.  BP was initially elevated and PIH work-up ensued. Work-up was negative.  She reports on-going headaches, with long h/o migraines on Fioricet.  She resisted discharge on day prior to discharge and again on day of discharge.  She was observed, fetal heart rate monitoring remained reassuring, and she had no signs/symptoms of progressing pre-eclampsia or other maternal-fetal concerns. She was deemed stable for discharge to home with outpatient follow up.  Discharge Exam: BP 139/90  Pulse 77  Temp(Src) 97.7 F (36.5 C) (Oral)  Resp 20  Ht 5\' 6"  (1.676 m)  Wt 226 lb (102.513 kg)  BMI 36.49 kg/m2  SpO2 100%  LMP 11/19/2013 General appearance: alert, cooperative and appears stated age Head: Normocephalic, without obvious abnormality, atraumatic Neck: supple, symmetrical, trachea midline Cardio: regular rate and rhythm GI: gravid, NT Extremities: extremities normal, atraumatic, no cyanosis or edema Skin: Skin color, texture, turgor normal. No rashes or lesions FHR 140's, average variability, no decels, no contractions, reactive  Discharge Condition: Stable  Disposition: 01-Home or Self Care  Discharge Instructions   Discharge activity:  Bathroom / Shower only    Complete by:  As directed      Discharge activity:  Up to eat    Complete by:  As directed      Discharge activity: Bedrest  Complete by:  As directed      Discharge diet:  No restrictions    Complete by:  As directed      Fetal Kick Count:  Lie on our left side for one hour after a meal, and count the number of times your baby kicks.  If it is less than 5 times, get up, move around and drink some juice.  Repeat the test 30 minutes later.  If it is still less than 5 kicks in an hour, notify your doctor.    Complete by:  As directed      No sexual activity restrictions    Complete by:  As directed             Medication List         acetaminophen 500 MG  tablet  Commonly known as:  TYLENOL  Take 1,000 mg by mouth every 6 (six) hours as needed for pain.     BIOTIN PO  Take 1 tablet by mouth daily.     butalbital-acetaminophen-caffeine 50-325-40 MG per tablet  Commonly known as:  FIORICET, ESGIC  TAKE 1 TO 2 TABLETS EVERY 6 HOURS AS NEEDED.     calcium carbonate 500 MG chewable tablet  Commonly known as:  TUMS - dosed in mg elemental calcium  Chew 3 tablets by mouth daily as needed for indigestion or heartburn.     clonazePAM 0.5 MG tablet  Commonly known as:  KLONOPIN  Take 1 tablet (0.5 mg total) by mouth 2 (two) times daily as needed for anxiety.     cyclobenzaprine 10 MG tablet  Commonly known as:  FLEXERIL  Take 1 tablet (10 mg total) by mouth 3 (three) times daily as needed for muscle spasms.     escitalopram 20 MG tablet  Commonly known as:  LEXAPRO  Take 1 tablet (20 mg total) by mouth daily.     hydrOXYzine 25 MG capsule  Commonly known as:  VISTARIL  TAKE ONE CAPSULE BY MOUTH TWICE DAILY AS NEEDED.     nystatin 100000 UNIT/ML suspension  Commonly known as:  MYCOSTATIN  TAKE 5 MLS BY MOUTH FOUR TIMES DAILY.     OVER THE COUNTER MEDICATION  Take 1 tablet by mouth daily. Potassium, magnesium, calcium combination.     prenatal multivitamin Tabs tablet  Take 1 tablet by mouth at bedtime.     promethazine 25 MG tablet  Commonly known as:  PHENERGAN  Take 1 tablet (25 mg total) by mouth every 6 (six) hours as needed for nausea.     tiZANidine 4 MG capsule  Commonly known as:  ZANAFLEX  1 twice daily as needed     Vitamin C 100 MG Chew  Chew 2 tablets by mouth daily.     Vitamin D3 2000 UNITS Tabs  Take 1 tablet by mouth daily.     ZINC PO  Take by mouth daily.           Follow-up Information   Follow up with [facility]. Schedule an appointment as soon as possible for a visit in 2 days.   Contact information:   [place] [id number] [phone]       Signed: Donnamae Jude M.D. 07/10/2014, 7:10 AM

## 2014-07-10 NOTE — Progress Notes (Signed)
Pt. Is stable and ready to be discharge home. Pt. Feels better after Dilaudid and phenergan from this morning. All belongings are with the patient. Discharge instructions and prescriptions reviewed with patient, friend, and patient's mother before discharge. Pt. Verbalized she understood and told me she would call to make an appt to follow up in the office on Friday. Pt. Wheeled out via wheelchair to friend's car.

## 2014-07-10 NOTE — Discharge Instructions (Signed)
Preeclampsia and Eclampsia °Preeclampsia is a serious condition that develops only during pregnancy. It is also called toxemia of pregnancy. This condition causes high blood pressure along with other symptoms, such as swelling and headaches. These may develop as the condition gets worse. Preeclampsia may occur 20 weeks or later into your pregnancy.  °Diagnosing and treating preeclampsia early is very important. If not treated early, it can cause serious problems for you and your baby. One problem it can lead to is eclampsia, which is a condition that causes muscle jerking or shaking (convulsions) in the mother. Delivering your baby is the best treatment for preeclampsia or eclampsia.  °RISK FACTORS °The cause of preeclampsia is not known. You may be more likely to develop preeclampsia if you have certain risk factors. These include:  °· Being pregnant for the first time. °· Having preeclampsia in a past pregnancy. °· Having a family history of preeclampsia. °· Having high blood pressure. °· Being pregnant with twins or triplets. °· Being 35 or older. °· Being African American. °· Having kidney disease or diabetes. °· Having medical conditions such as lupus or blood diseases. °· Being very overweight (obese). °SIGNS AND SYMPTOMS  °The earliest signs of preeclampsia are: °· High blood pressure. °· Increased protein in your urine. Your health care provider will check for this at every prenatal visit. °Other symptoms that can develop include:  °· Severe headaches. °· Sudden weight gain. °· Swelling of your hands, face, legs, and feet. °· Feeling sick to your stomach (nauseous) and throwing up (vomiting). °· Vision problems (blurred or double vision). °· Numbness in your face, arms, legs, and feet. °· Dizziness. °· Slurred speech. °· Sensitivity to bright lights. °· Abdominal pain. °DIAGNOSIS  °There are no screening tests for preeclampsia. Your health care provider will ask you about symptoms and check for signs of  preeclampsia during your prenatal visits. You may also have tests, including: °· Urine testing. °· Blood testing. °· Checking your baby's heart rate. °· Checking the health of your baby and your placenta using images created with sound waves (ultrasound). °TREATMENT  °You can work out the best treatment approach together with your health care provider. It is very important to keep all prenatal appointments. If you have an increased risk of preeclampsia, you may need more frequent prenatal exams. °· Your health care provider may prescribe bed rest. °· You may have to eat as little salt as possible. °· You may need to take medicine to lower your blood pressure if the condition does not respond to more conservative measures. °· You may need to stay in the hospital if your condition is severe. There, treatment will focus on controlling your blood pressure and fluid retention. You may also need to take medicine to prevent seizures. °· If the condition gets worse, your baby may need to be delivered early to protect you and the baby. You may have your labor started with medicine (be induced), or you may have a cesarean delivery. °· Preeclampsia usually goes away after the baby is born. °HOME CARE INSTRUCTIONS  °· Only take over-the-counter or prescription medicines as directed by your health care provider. °· Lie on your left side while resting. This keeps pressure off your baby. °· Elevate your feet while resting. °· Get regular exercise. Ask your health care provider what type of exercise is safe for you. °· Avoid caffeine and alcohol. °· Do not smoke. °· Drink 6-8 glasses of water every day. °· Eat a balanced diet   that is low in salt. Do not add salt to your food. °· Avoid stressful situations as much as possible. °· Get plenty of rest and sleep. °· Keep all prenatal appointments and tests as scheduled. °SEEK MEDICAL CARE IF: °· You are gaining more weight than expected. °· You have any headaches, abdominal pain, or  nausea. °· You are bruising more than usual. °· You feel dizzy or light-headed. °SEEK IMMEDIATE MEDICAL CARE IF:  °· You develop sudden or severe swelling anywhere in your body. This usually happens in the legs. °· You gain 5 lb (2.3 kg) or more in a week. °· You have a severe headache, dizziness, problems with your vision, or confusion. °· You have severe abdominal pain. °· You have lasting nausea or vomiting. °· You have a seizure. °· You have trouble moving any part of your body. °· You develop numbness in your body. °· You have trouble speaking. °· You have any abnormal bleeding. °· You develop a stiff neck. °· You pass out. °MAKE SURE YOU:  °· Understand these instructions. °· Will watch your condition. °· Will get help right away if you are not doing well or get worse. °Document Released: 11/19/2000 Document Revised: 11/27/2013 Document Reviewed: 09/14/2013 °[employer]® Patient Information ©2015 [employer]. This information is not intended to replace advice given to you by your health care provider. Make sure you discuss any questions you have with your health care provider. ° °

## 2014-07-12 ENCOUNTER — Encounter: Payer: Self-pay | Admitting: Obstetrics & Gynecology

## 2014-07-12 ENCOUNTER — Ambulatory Visit (INDEPENDENT_AMBULATORY_CARE_PROVIDER_SITE_OTHER): Payer: Medicaid Other | Admitting: Obstetrics & Gynecology

## 2014-07-12 VITALS — BP 142/94 | Wt 226.0 lb

## 2014-07-12 DIAGNOSIS — Z331 Pregnant state, incidental: Secondary | ICD-10-CM

## 2014-07-12 DIAGNOSIS — Z1389 Encounter for screening for other disorder: Secondary | ICD-10-CM

## 2014-07-12 DIAGNOSIS — O09899 Supervision of other high risk pregnancies, unspecified trimester: Secondary | ICD-10-CM

## 2014-07-12 DIAGNOSIS — O139 Gestational [pregnancy-induced] hypertension without significant proteinuria, unspecified trimester: Secondary | ICD-10-CM

## 2014-07-12 DIAGNOSIS — O133 Gestational [pregnancy-induced] hypertension without significant proteinuria, third trimester: Secondary | ICD-10-CM | POA: Insufficient documentation

## 2014-07-12 LAB — POCT URINALYSIS DIPSTICK
Blood, UA: NEGATIVE
GLUCOSE UA: NEGATIVE
Ketones, UA: NEGATIVE
NITRITE UA: NEGATIVE
Protein, UA: 1

## 2014-07-12 MED ORDER — LABETALOL HCL 100 MG PO TABS: 100.0000 mg | ORAL_TABLET | Freq: Two times a day (BID) | ORAL | Status: DC

## 2014-07-12 NOTE — Progress Notes (Signed)
High Risk Pregnancy Diagnosis(es):   [redacted]w[redacted]d Gestational Hypertension  G3P1011 [redacted]w[redacted]d Estimated Date of Delivery: 09/02/14  Blood pressure 160/110, weight 226 lb (102.513 kg), last menstrual period 11/19/2013.  Recheck BP 142/94 Urinalysis: Positive for 1+ protein   HPI: Reports good fetal movement   Discussed continuum of gestational hypertension to pre eclampsia, given parameters for coming in of BP     BP weight and urine results all reviewed and noted. Patient reports good fetal movement, denies any bleeding and no rupture of membranes symptoms or regular contractions.  Fundal Height:  34  Fetal Heart rate:  172 Edema:  none  Patient is without complaints. All questions were answered.  Assessment:  [redacted]w[redacted]d,   Gestational hypertension  Medication(s) Plans:  Begin labetalol 100 BID  Treatment Plan:  As above + twice weekly testing, sonogram alternating with NST  Follow up in Monday for sonogram for OB appt, sonogram

## 2014-07-14 ENCOUNTER — Inpatient Hospital Stay (HOSPITAL_COMMUNITY): Payer: Medicaid Other

## 2014-07-14 ENCOUNTER — Inpatient Hospital Stay (HOSPITAL_COMMUNITY)
Admission: AD | Admit: 2014-07-14 | Discharge: 2014-07-14 | Disposition: A | Discharge status: Home / Self Care | Payer: Medicaid Other | Source: Ambulatory Visit | Attending: Obstetrics & Gynecology | Admitting: Obstetrics & Gynecology

## 2014-07-14 ENCOUNTER — Encounter (HOSPITAL_COMMUNITY): Payer: Self-pay

## 2014-07-14 DIAGNOSIS — O368190 Decreased fetal movements, unspecified trimester, not applicable or unspecified: Secondary | ICD-10-CM | POA: Diagnosis present

## 2014-07-14 DIAGNOSIS — Z9884 Bariatric surgery status: Secondary | ICD-10-CM | POA: Diagnosis not present

## 2014-07-14 DIAGNOSIS — Z3689 Encounter for other specified antenatal screening: Secondary | ICD-10-CM

## 2014-07-14 DIAGNOSIS — O99840 Bariatric surgery status complicating pregnancy, unspecified trimester: Secondary | ICD-10-CM

## 2014-07-14 DIAGNOSIS — O368130 Decreased fetal movements, third trimester, not applicable or unspecified: Secondary | ICD-10-CM

## 2014-07-14 LAB — URINALYSIS, ROUTINE W REFLEX MICROSCOPIC
Bilirubin Urine: NEGATIVE
Glucose, UA: NEGATIVE mg/dL
HGB URINE DIPSTICK: NEGATIVE
Ketones, ur: NEGATIVE mg/dL
Leukocytes, UA: NEGATIVE
NITRITE: NEGATIVE
Protein, ur: NEGATIVE mg/dL
Specific Gravity, Urine: <1.005 — ABNORMAL LOW (ref 1.005–1.030)
UROBILINOGEN UA: 0.2 mg/dL (ref 0.0–1.0)
pH: 7 (ref 5.0–8.0)

## 2014-07-14 NOTE — Progress Notes (Signed)
Called b/c pt forgot to speak with CNM about diarrhea and requesting to see her again. Will come see pt. Updated CNM on FHR tracing

## 2014-07-14 NOTE — Discharge Instructions (Signed)
Fetal Movement Counts Patient Name: __________________________________________________ Patient Due Date: ____________________ Performing a fetal movement count is highly recommended in high-risk pregnancies, but it is good for every pregnant woman to do. Your health care provider may ask you to start counting fetal movements at 28 weeks of the pregnancy. Fetal movements often increase:  After eating a full meal.  After physical activity.  After eating or drinking something sweet or cold.  At rest. Pay attention to when you feel the baby is most active. This will help you notice a pattern of your baby's sleep and wake cycles and what factors contribute to an increase in fetal movement. It is important to perform a fetal movement count at the same time each day when your baby is normally most active.  HOW TO COUNT FETAL MOVEMENTS 1. Find a quiet and comfortable area to sit or lie down on your left side. Lying on your left side provides the best blood and oxygen circulation to your baby. 2. Write down the day and time on a sheet of paper or in a journal. 3. Start counting kicks, flutters, swishes, rolls, or jabs in a 2-hour period. You should feel at least 10 movements within 2 hours. 4. If you do not feel 10 movements in 2 hours, wait 2-3 hours and count again. Look for a change in the pattern or not enough counts in 2 hours. SEEK MEDICAL CARE IF:  You feel less than 10 counts in 2 hours, tried twice.  There is no movement in over an hour.  The pattern is changing or taking longer each day to reach 10 counts in 2 hours.  You feel the baby is not moving as he or she usually does. Date: ____________ Movements: ____________ Start time: ____________ Penny [name] time: ____________  Date: ____________ Movements: ____________ Start time: ____________ Penny [name] time: ____________ Date: ____________ Movements: ____________ Start time: ____________ Penny [name] time: ____________ Date: ____________ Movements:  ____________ Start time: ____________ Penny [name] time: ____________ Date: ____________ Movements: ____________ Start time: ____________ Penny [name] time: ____________ Date: ____________ Movements: ____________ Start time: ____________ Penny [name] time: ____________ Document Released: 12/22/2006 Document Revised: 04/08/2014 Document Reviewed: 09/18/2012 Exit[employer] Patient Information 2015 [employer]. This information is not intended to replace advice given to you by your health care provider. Make sure you discuss any questions you have with your health care provider.   High-Fiber Diet Fiber is found in fruits, vegetables, and grains. A high-fiber diet encourages the addition of more whole grains, legumes, fruits, and vegetables in your diet. The recommended amount of fiber for adult males is 38 g per day. For adult females, it is 25 g per day. Pregnant and lactating women should get 28 g of fiber per day. If you have a digestive or bowel problem, ask your caregiver for advice before adding high-fiber foods to your diet. Eat a variety of high-fiber foods instead of only a select few type of foods.  PURPOSE  To increase stool bulk.  To make bowel movements more regular to prevent constipation.  To lower cholesterol.  To prevent overeating. WHEN IS THIS DIET USED?  It may be used if you have constipation and hemorrhoids.  It may be used if you have uncomplicated diverticulosis (intestine condition) and irritable bowel syndrome.  It may be used if you need help with weight management.  It may be used if you want to add it to your diet as a protective measure against atherosclerosis, diabetes, and cancer. SOURCES OF FIBER  Whole-grain breads and cereals.  Fruits, such as apples, oranges, bananas, berries, prunes, and pears.  Vegetables, such as green peas, carrots, sweet potatoes, beets, broccoli, cabbage, spinach, and artichokes.  Legumes, such split peas, soy, lentils.  Almonds. FIBER CONTENT  IN FOODS Starches and Grains / Dietary Fiber (g)  Raisin Bran, 1 cup/ 7g  Cheerios, 1 cup / 3 g  Corn Flakes cereal, 1 cup / 0.7 g  Rice crispy treat cereal, 1 cup / 0.3 g  Instant oatmeal (cooked),  cup / 2 g  Frosted wheat cereal, 1 cup / 5.1 g  Brown, long-grain rice (cooked), 1 cup / 3.5 g  White, long-grain rice (cooked), 1 cup / 0.6 g  Enriched macaroni (cooked), 1 cup / 2.5 g Legumes / Dietary Fiber (g)  Baked beans (canned, plain, or vegetarian),  cup / 5.2 g  Kidney beans (canned),  cup / 6.8 g  Pinto beans (cooked),  cup / 5.5 g Breads and Crackers / Dietary Fiber (g)  Plain or honey graham crackers, 2 squares / 0.7 g  Saltine crackers, 3 squares / 0.3 g  Plain, salted pretzels, 10 pieces / 1.8 g  Whole-wheat bread, 1 slice / 1.9 g  White bread, 1 slice / 0.7 g  Raisin bread, 1 slice / 1.2 g  Plain bagel, 3 oz / 2 g  Flour tortilla, 1 oz / 0.9 g  Corn tortilla, 1 small / 1.5 g  Hamburger or hotdog bun, 1 small / 0.9 g Fruits / Dietary Fiber (g)  Apple with skin, 1 medium / 4.4 g  Sweetened applesauce,  cup / 1.5 g  Banana,  medium / 1.5 g  Grapes, 10 grapes / 0.4 g  Orange, 1 small / 2.3 g  Raisin, 1.5 oz / 1.6 g  Melon, 1 cup / 1.4 g Vegetables / Dietary Fiber (g)  Green beans (canned),  cup / 1.3 g  Carrots (cooked),  cup / 2.3 g  Broccoli (cooked),  cup / 2.8 g  Peas (cooked),  cup / 4.4 g  Mashed potatoes,  cup / 1.6 g  Lettuce, 1 cup / 0.5 g  Corn (canned),  cup / 1.6 g  Tomato,  cup / 1.1 g Document Released: 11/22/2005 Document Revised: 05/23/2012 Document Reviewed: 02/24/2012 ExitCare Patient Information 2015 [place], [place]. This information is not intended to replace advice given to you by your health care provider. Make sure you discuss any questions you have with your health care provider. Diarrhea Diarrhea is frequent loose and watery bowel movements. It can cause you to feel weak and dehydrated.  Dehydration can cause you to become tired and thirsty, have a dry mouth, and have decreased urination that often is dark yellow. Diarrhea is a sign of another problem, most often an infection that will not last long. In most cases, diarrhea typically lasts 2-3 days. However, it can last longer if it is a sign of something more serious. It is important to treat your diarrhea as directed by your caregiver to lessen or prevent future episodes of diarrhea. CAUSES  Some common causes include:  Gastrointestinal infections caused by viruses, bacteria, or parasites.  Food poisoning or food allergies.  Certain medicines, such as antibiotics, chemotherapy, and laxatives.  Artificial sweeteners and fructose.  Digestive disorders. HOME CARE INSTRUCTIONS  Ensure adequate fluid intake (hydration): Have 1 cup (8 oz) of fluid for each diarrhea episode. Avoid fluids that contain simple sugars or sports drinks, fruit juices, whole milk products, and sodas. Your urine should be clear  or pale yellow if you are drinking enough fluids. Hydrate with an oral rehydration solution that you can purchase at pharmacies, retail stores, and online. You can prepare an oral rehydration solution at home by mixing the following ingredients together:   - tsp table salt.   tsp baking soda.   tsp salt substitute containing potassium chloride.  1  tablespoons sugar.  1 L (34 oz) of water.  Certain foods and beverages may increase the speed at which food moves through the gastrointestinal (GI) tract. These foods and beverages should be avoided and include:  Caffeinated and alcoholic beverages.  High-fiber foods, such as raw fruits and vegetables, nuts, seeds, and whole grain breads and cereals.  Foods and beverages sweetened with sugar alcohols, such as xylitol, sorbitol, and mannitol.  Some foods may be well tolerated and may help thicken stool including:  Starchy foods, such as rice, toast, pasta, low-sugar cereal,  oatmeal, grits, baked potatoes, crackers, and bagels.  Bananas.  Applesauce.  Add probiotic-rich foods to help increase healthy bacteria in the GI tract, such as yogurt and fermented milk products.  Wash your hands well after each diarrhea episode.  Only take over-the-counter or prescription medicines as directed by your caregiver.  Take a warm bath to relieve any burning or pain from frequent diarrhea episodes. SEEK IMMEDIATE MEDICAL CARE IF:   You are unable to keep fluids down.  You have persistent vomiting.  You have blood in your stool, or your stools are black and tarry.  You do not urinate in 6-8 hours, or there is only a small amount of very dark urine.  You have abdominal pain that increases or localizes.  You have weakness, dizziness, confusion, or light-headedness.  You have a severe headache.  Your diarrhea gets worse or does not get better.  You have a fever or persistent symptoms for more than 2-3 days.  You have a fever and your symptoms suddenly get worse. MAKE SURE YOU:   Understand these instructions.  Will watch your condition.  Will get help right away if you are not doing well or get worse. Document Released: 11/12/2002 Document Revised: 04/08/2014 Document Reviewed: 07/30/2012 [facility] Patient Information 2015 [place], [place]. This information is not intended to replace advice given to you by your health care provider. Make sure you discuss any questions you have with your health care provider.

## 2014-07-14 NOTE — MAU Note (Signed)
Pt presents complaining of decreased fetal movement that started on Wednesday. States she has felt the baby move each day since except today. Pt states she tried resting and caffine but no movement from the baby today. Denies vaginal bleeding or discharge. Pt denies contractions

## 2014-07-14 NOTE — MAU Provider Note (Signed)
Chief Complaint:  Decreased Fetal Movement   None     HPI: Penny [name] is a 28 y.o. G3P1011 at 22w6dwho presents to maternity admissions reporting decreased fetal movement x2 days.  Today she went 7 hours without feeling any fetal movement.  She started labetalol PO yesterday for elevated BP in pregnancy and is concerned that this has affected fetal movement.  She also reports frequent BM and loose stool x7 today. She has hx gastric bypass and has some GI upset last 3-4 weeks of pregnancy.  She denies cramping/contractions, denies LOF, vaginal bleeding, vaginal itching/burning, urinary symptoms, h/a, dizziness, n/v, or fever/chills.     Past Medical History: Past Medical History  Diagnosis Date  . Migraines   . Interstitial cystitis   . IUD FEB 2010  . Gastritis JULY 2011  . Depression   . 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 Fatty liver   . Obesity (BMI 30-39.9) 2011 228 LBS BMI 36.8  . Iron deficiency anemia 07/23/2010  . Irritable bowel syndrome 2012 DIARRHEA    JUN 2012 TTG IgA 14.9  . Elevated liver enzymes JUL 2011ALK PHOS 111-127 AST  143-267 ALT  213-321T BILI 0.6  ALB  3.7-4.06 Jun 2011 ALK PHOS 118 AST 24 ALT 42 T BILI 0.4 ALB 3.9  . Chronic daily headache   . Ovarian cyst   . PONV (postoperative nausea and vomiting)   . ADD (attention deficit disorder)   . RLQ abdominal pain 07/18/2013  . Menorrhagia 07/18/2013  . Anxiety   . Potassium (K) deficiency   . Dysmenorrhea 09/18/2013  . Stress 09/18/2013  . Patient desires pregnancy 09/18/2013  . Pregnant 12/25/2013  . Blood transfusion without reported diagnosis   . Heart murmur   . Gastric bypass status for obesity     Past obstetric history: OB History  Gravida Para Term Preterm AB SAB TAB Ectopic Multiple Living  $Remov'3 1 1  1 1    1    'emwThG$ # Outcome Date GA Lbr Len/2nd Weight Sex Delivery Anes PTL Lv  3 CUR           2 TRM 2010 [redacted]w[redacted]d  3.572 kg (7 lb 14 oz) M SVD EPI  Y     Comments: IOL d/t PIH  1 SAB                Past Surgical History: Past Surgical History  Procedure Laterality Date  . Gab  2007    in [place]-POUCH 5 CM  . Cholecystectomy  2005    biliary dyskinesia  . Cath self every nite      for sodium bicarb injection (discontinued 2013)  . Colonoscopy  JUN 2012 ABD PN/DIARRHEA WITH PROPOFOL    NL COLON  . Upper gastrointestinal endoscopy  JULY 2011 NAUSEA-D125,V6, PH 25    Bx; GASTRITIS, POUCH-5 CM LONG  . Dilation and curettage of uterus    . Tonsillectomy    . Gastric bypass  06/2006  . Savory dilation  06/20/2012    OEU:MPNT gastritis/Ulcer in the mid jejunum  . Tonsillectomy and adenoidectomy    . Esophagogastroduodenoscopy    . Wisdom tooth extraction      Family History: Family History  Problem Relation Age of Onset  . Hemochromatosis Maternal Grandmother   . Migraines Maternal Grandmother   . Cancer Maternal Grandmother   . Hypertension Father   . Diabetes Father   . Coronary artery disease Father   .  Migraines Paternal Grandmother   . Cancer Mother     breast  . Hemochromatosis Mother   . Coronary artery disease Paternal Grandfather     Social History: History  Substance Use Topics  . Smoking status: Former Smoker -- 5 years    Types: Cigarettes    Quit date: 01/07/2014  . Smokeless tobacco: Never Used  . Alcohol Use: No    Allergies:  Allergies  Allergen Reactions  . Gabapentin Other (See Comments)    Pt states that she was unresponsive after taking this medication, but her vitals remained stable.    . Metoclopramide Hcl Anxiety and Other (See Comments)    Pt states that she felt like she was trapped in a box, and could not get out.  Pt also states that she had temporary loss of movement, weakness, and tingling.    . Topamax [Topiramate] Shortness Of Breath and Other (See Comments)    Adverse side effects.  Pt states this medication changed her taste for a short period of time and caused numbness and tingling in extremities.    . Codeine  Nausea And Vomiting  . Latex Rash  . Tape Itching and Rash    Meds:  No prescriptions prior to admission    ROS: Pertinent findings in history of present illness.  Physical Exam  Blood pressure 123/80, pulse 86, temperature 98.1 F (36.7 C), temperature source Oral, resp. rate 18, last menstrual period 11/19/2013. GENERAL: Well-developed, well-nourished female in no acute distress.  HEENT: normocephalic HEART: normal rate RESP: normal effort ABDOMEN: Soft, non-tender, gravid appropriate for gestational age EXTREMITIES: Nontender, no edema NEURO: alert and oriented SPECULUM EXAM: NEFG, physiologic discharge, no blood, cervix clean    FHT:  Baseline 145 , moderate variability, accelerations present (15x15), no decelerations Contractions: None on toco or to palpation   Labs: Results for orders placed during the hospital encounter of 07/14/14 (from the past 24 hour(s))  URINALYSIS, ROUTINE W REFLEX MICROSCOPIC     Status: Abnormal   Collection Time    07/14/14 11:15 AM      Result Value Ref Range   Color, Urine YELLOW  YELLOW   APPearance CLEAR  CLEAR   Specific Gravity, Urine <1.005 (*) 1.005 - 1.030   pH 7.0  5.0 - 8.0   Glucose, UA NEGATIVE  NEGATIVE mg/dL   Hgb urine dipstick NEGATIVE  NEGATIVE   Bilirubin Urine NEGATIVE  NEGATIVE   Ketones, ur NEGATIVE  NEGATIVE mg/dL   Protein, ur NEGATIVE  NEGATIVE mg/dL   Urobilinogen, UA 0.2  0.0 - 1.0 mg/dL   Nitrite NEGATIVE  NEGATIVE   Leukocytes, UA NEGATIVE  NEGATIVE    Imaging:  BPP 8/8 with AFI 13.98   Assessment: 1. Decreased fetal movement, third trimester, not applicable or unspecified fetus   2. NST (non-stress test) reactive   3. Previous gastric bypass affecting pregnancy, antepartum     Plan: Discharge home Fetal kick counts reviewed Reassurance provided about BPP and NST results.  Pt reports improved fetal movement at time of discharge.  Discussed management of frequent stools with dietary changes and  use of Imodium PRN.   Increase PO fluids      Follow-up Information   Follow up with [facility]. (As scheduled tomorrow)    Contact information:   [place] [id number] [phone]      Follow up with [facility]. (As needed for emergencies)    Contact  information:   [address] [id number] [place] [address] [phone]       Medication List         acetaminophen 500 MG tablet  Commonly known as:  TYLENOL  Take 1,000 mg by mouth every 6 (six) hours as needed for pain.     BIOTIN PO  Take 1 tablet by mouth daily.     butalbital-acetaminophen-caffeine 50-325-40 MG per tablet  Commonly known as:  FIORICET, ESGIC  TAKE 1 TO 2 TABLETS EVERY 6 HOURS AS NEEDED.     calcium carbonate 500 MG chewable tablet  Commonly known as:  TUMS - dosed in mg elemental calcium  Chew 3 tablets by mouth daily as needed for indigestion or heartburn.     clonazePAM 0.5 MG tablet  Commonly known as:  KLONOPIN  Take 1 tablet (0.5 mg total) by mouth 2 (two) times daily as needed for anxiety.     cyclobenzaprine 10 MG tablet  Commonly known as:  FLEXERIL  Take 1 tablet (10 mg total) by mouth 3 (three) times daily as needed for muscle spasms.     escitalopram 20 MG tablet  Commonly known as:  LEXAPRO  Take 1 tablet (20 mg total) by mouth daily.     hydrOXYzine 25 MG capsule  Commonly known as:  VISTARIL  TAKE ONE CAPSULE BY MOUTH TWICE DAILY AS NEEDED.     labetalol 100 MG tablet  Commonly known as:  NORMODYNE  Take 1 tablet (100 mg total) by mouth 2 (two) times daily.     nystatin 100000 UNIT/ML suspension  Commonly known as:  MYCOSTATIN  TAKE 5 MLS BY MOUTH FOUR TIMES DAILY.     prenatal multivitamin Tabs tablet  Take 1 tablet by mouth at bedtime.     promethazine 25 MG tablet  Commonly known as:  PHENERGAN  TAKE ONE TABLET BY MOUTH EVERY 6 HOURS AS NEEDED FOR NAUSEA.     tiZANidine 4 MG  capsule  Commonly known as:  ZANAFLEX  1 twice daily as needed     Vitamin C 100 MG Chew  Chew 2 tablets by mouth daily.     Vitamin D3 2000 UNITS Tabs  Take 1 tablet by mouth daily.     ZINC PO  Take by mouth daily.        Fatima Blank Certified Nurse-Midwife 07/14/2014 2:52 PM

## 2014-07-14 NOTE — MAU Provider Note (Signed)
Attestation of Attending Supervision of Advanced Practitioner (CNM/NP): Evaluation and management procedures were performed by the Advanced Practitioner under my supervision and collaboration. I have reviewed the Advanced Practitioner's note and chart, and I agree with the management and plan.  Penny Esker H. 7:15 PM

## 2014-07-14 NOTE — Progress Notes (Signed)
Notified of pt arrival in MAU and complaint of decreased fetal movement. Will order BPP

## 2014-07-15 ENCOUNTER — Ambulatory Visit (INDEPENDENT_AMBULATORY_CARE_PROVIDER_SITE_OTHER): Payer: Medicaid Other | Admitting: Obstetrics & Gynecology

## 2014-07-15 ENCOUNTER — Ambulatory Visit (INDEPENDENT_AMBULATORY_CARE_PROVIDER_SITE_OTHER): Payer: Medicaid Other

## 2014-07-15 ENCOUNTER — Encounter: Payer: Self-pay | Admitting: Obstetrics & Gynecology

## 2014-07-15 ENCOUNTER — Other Ambulatory Visit: Payer: Self-pay | Admitting: Obstetrics & Gynecology

## 2014-07-15 VITALS — BP 150/60 | Wt 230.0 lb

## 2014-07-15 DIAGNOSIS — O09899 Supervision of other high risk pregnancies, unspecified trimester: Secondary | ICD-10-CM

## 2014-07-15 DIAGNOSIS — O368190 Decreased fetal movements, unspecified trimester, not applicable or unspecified: Secondary | ICD-10-CM

## 2014-07-15 DIAGNOSIS — Z1389 Encounter for screening for other disorder: Secondary | ICD-10-CM

## 2014-07-15 DIAGNOSIS — O133 Gestational [pregnancy-induced] hypertension without significant proteinuria, third trimester: Secondary | ICD-10-CM

## 2014-07-15 DIAGNOSIS — Z331 Pregnant state, incidental: Secondary | ICD-10-CM

## 2014-07-15 DIAGNOSIS — O139 Gestational [pregnancy-induced] hypertension without significant proteinuria, unspecified trimester: Secondary | ICD-10-CM

## 2014-07-15 LAB — POCT URINALYSIS DIPSTICK
Blood, UA: NEGATIVE
GLUCOSE UA: NEGATIVE
Ketones, UA: NEGATIVE
Nitrite, UA: NEGATIVE

## 2014-07-15 NOTE — Progress Notes (Signed)
U/S(33+0wks)-vtx active fetus, EFW 4 lb (25th%tile), fluid WNL AFI-7.8cm SDp-2.2cm, FHR-148 bpm, UA Doppler RI-0.74 & 0.71, BPP 8/8, Gr 1 placenta (succ lobe noted)

## 2014-07-15 NOTE — Addendum Note (Signed)
Addended by: Doyne Keel on: 07/15/2014 01:31 PM   Modules accepted: Orders

## 2014-07-15 NOTE — Progress Notes (Signed)
Sonogram is reviewed and report done, normal   High Risk Pregnancy Diagnosis(es):   Gestational Hypertension  G3P1011 [redacted]w[redacted]d Estimated Date of Delivery: 09/02/14  Blood pressure 150/60, weight 230 lb (104.327 kg), last menstrual period 11/19/2013.  Urinalysis: Positive for trace protein   HPI: Some spots, went to [facility] yesterday for diminished fetal movement and had reassuring  Fetal testing    BP weight and urine results all reviewed and noted. Patient reports good fetal movement, denies any bleeding and no rupture of membranes symptoms or regular contractions.  Fundal Height:  34 Fetal Heart rate:  148 Edema:  Trace   Patient is without complaints. All questions were answered.  Assessment:  [redacted]w[redacted]d,   Gestational hypertension  Medication(s) Plans:  Continue labetalol 100 BID  Treatment Plan:  Twice weekly surveillance alternationg labs as indicated  Follow up in Thursday weeks for OB appt, NST

## 2014-07-16 ENCOUNTER — Telehealth: Payer: Self-pay | Admitting: *Deleted

## 2014-07-16 ENCOUNTER — Telehealth: Payer: Self-pay | Admitting: Obstetrics & Gynecology

## 2014-07-16 LAB — PROTEIN, URINE, 24 HOUR
PROTEIN 24H UR: 85 mg/d (ref 50–100)
PROTEIN, URINE: 5 mg/dL

## 2014-07-16 NOTE — Telephone Encounter (Signed)
Pt requesting refill on Tizanidine 4 mg, states she is completely out.

## 2014-07-17 ENCOUNTER — Encounter: Payer: Medicaid Other | Admitting: Obstetrics & Gynecology

## 2014-07-17 MED ORDER — TIZANIDINE HCL 4 MG PO CAPS: ORAL_CAPSULE | ORAL | Status: DC

## 2014-07-17 NOTE — Telephone Encounter (Signed)
Pt informed Zanaflex e-scribed.

## 2014-07-17 NOTE — Telephone Encounter (Signed)
Zanaflex e-scribed

## 2014-07-18 ENCOUNTER — Encounter: Payer: Medicaid Other | Admitting: Obstetrics & Gynecology

## 2014-07-18 ENCOUNTER — Ambulatory Visit (INDEPENDENT_AMBULATORY_CARE_PROVIDER_SITE_OTHER): Payer: Medicaid Other | Admitting: Obstetrics & Gynecology

## 2014-07-18 ENCOUNTER — Encounter: Payer: Self-pay | Admitting: Obstetrics & Gynecology

## 2014-07-18 VITALS — BP 126/80 | Wt 231.0 lb

## 2014-07-18 DIAGNOSIS — O99013 Anemia complicating pregnancy, third trimester: Secondary | ICD-10-CM

## 2014-07-18 DIAGNOSIS — O09899 Supervision of other high risk pregnancies, unspecified trimester: Secondary | ICD-10-CM

## 2014-07-18 DIAGNOSIS — D509 Iron deficiency anemia, unspecified: Secondary | ICD-10-CM

## 2014-07-18 DIAGNOSIS — Z331 Pregnant state, incidental: Secondary | ICD-10-CM

## 2014-07-18 DIAGNOSIS — O99019 Anemia complicating pregnancy, unspecified trimester: Secondary | ICD-10-CM

## 2014-07-18 DIAGNOSIS — Z1389 Encounter for screening for other disorder: Secondary | ICD-10-CM

## 2014-07-18 DIAGNOSIS — O133 Gestational [pregnancy-induced] hypertension without significant proteinuria, third trimester: Secondary | ICD-10-CM

## 2014-07-18 DIAGNOSIS — O139 Gestational [pregnancy-induced] hypertension without significant proteinuria, unspecified trimester: Secondary | ICD-10-CM

## 2014-07-18 LAB — POCT URINALYSIS DIPSTICK
Blood, UA: NEGATIVE
Glucose, UA: NEGATIVE
Ketones, UA: NEGATIVE
Leukocytes, UA: NEGATIVE
NITRITE UA: NEGATIVE
Protein, UA: NEGATIVE

## 2014-07-18 LAB — POCT HEMOGLOBIN: Hemoglobin: 10.5 g/dL — AB (ref 12.2–16.2)

## 2014-07-18 NOTE — Progress Notes (Signed)
Reactive NST   High Risk Pregnancy Diagnosis(es):   Gestational Hypertension  G3P1011 [redacted]w[redacted]d Estimated Date of Delivery: 09/02/14  Blood pressure 126/80, weight 231 lb (104.781 kg), last menstrual period 11/19/2013.  Urinalysis: Negative   HPI: Reactive NST     BP weight and urine results all reviewed and noted. Patient reports good fetal movement, denies any bleeding and no rupture of membranes symptoms or regular contractions.  Fundal Height:  38 Fetal Heart rate:  145 Edema:  trace  Patient is without complaints. All questions were answered.  Assessment:  [redacted]w[redacted]d,   Gestational hypertension  Medication(s) Plans:  Continue labetalol 100 mg BID  Treatment Plan:  Twice weekly surveillance  Follow up in Tuesday for NST weeks for OB appt

## 2014-07-23 ENCOUNTER — Ambulatory Visit (INDEPENDENT_AMBULATORY_CARE_PROVIDER_SITE_OTHER): Payer: Medicaid Other | Admitting: Obstetrics & Gynecology

## 2014-07-23 ENCOUNTER — Encounter: Payer: Self-pay | Admitting: Obstetrics & Gynecology

## 2014-07-23 ENCOUNTER — Other Ambulatory Visit: Payer: Self-pay | Admitting: Obstetrics & Gynecology

## 2014-07-23 ENCOUNTER — Ambulatory Visit (INDEPENDENT_AMBULATORY_CARE_PROVIDER_SITE_OTHER): Payer: Medicaid Other

## 2014-07-23 VITALS — BP 110/80 | Wt 234.0 lb

## 2014-07-23 DIAGNOSIS — O09899 Supervision of other high risk pregnancies, unspecified trimester: Secondary | ICD-10-CM

## 2014-07-23 DIAGNOSIS — O133 Gestational [pregnancy-induced] hypertension without significant proteinuria, third trimester: Secondary | ICD-10-CM

## 2014-07-23 DIAGNOSIS — O139 Gestational [pregnancy-induced] hypertension without significant proteinuria, unspecified trimester: Secondary | ICD-10-CM

## 2014-07-23 NOTE — Progress Notes (Signed)
High Risk Pregnancy Diagnosis(es):   Gestational hypertension  G3P1011 [redacted]w[redacted]d Estimated Date of Delivery: 09/02/14  Blood pressure 110/80, weight 234 lb (106.142 kg), last menstrual period 11/19/2013.  Urinalysis: Negative   HPI: Headaches better     BP weight and urine results all reviewed and noted. Patient reports good fetal movement, denies any bleeding and no rupture of membranes symptoms or regular contractions.  Fundal Height:  38  Fetal Heart rate:  163 Edema:  trace  Patient is without complaints. All questions were answered.  Assessment:  [redacted]w[redacted]d,   Gestational hypertension  Medication(s) Plans:  Labetalol 100 BID sometimes taking 1/2 due to headached  Treatment Plan:  Continue tice weekly  Follow up in Friday  weeks for OB appt, NST

## 2014-07-23 NOTE — Progress Notes (Signed)
U/S(34+1wks)-vtx active fetus BPP 8/8, fluid WNL AFI-13.7cm, FHR-163 bpm, UA Doppler RI-0.54 & 0.58, anterior placenta with succ lobe posterior

## 2014-07-25 ENCOUNTER — Inpatient Hospital Stay (EMERGENCY_DEPARTMENT_HOSPITAL)
Admission: AD | Admit: 2014-07-25 | Discharge: 2014-07-26 | Disposition: A | Discharge status: Home / Self Care | Payer: Medicaid Other | Source: Ambulatory Visit | Attending: Obstetrics & Gynecology | Admitting: Obstetrics & Gynecology

## 2014-07-25 ENCOUNTER — Inpatient Hospital Stay (HOSPITAL_COMMUNITY): Payer: Medicaid Other

## 2014-07-25 ENCOUNTER — Encounter (HOSPITAL_COMMUNITY): Payer: Self-pay | Admitting: General Practice

## 2014-07-25 DIAGNOSIS — O99840 Bariatric surgery status complicating pregnancy, unspecified trimester: Secondary | ICD-10-CM

## 2014-07-25 DIAGNOSIS — O9902 Anemia complicating childbirth: Secondary | ICD-10-CM | POA: Diagnosis present

## 2014-07-25 DIAGNOSIS — D649 Anemia, unspecified: Secondary | ICD-10-CM | POA: Diagnosis present

## 2014-07-25 DIAGNOSIS — O133 Gestational [pregnancy-induced] hypertension without significant proteinuria, third trimester: Secondary | ICD-10-CM

## 2014-07-25 DIAGNOSIS — O368390 Maternal care for abnormalities of the fetal heart rate or rhythm, unspecified trimester, not applicable or unspecified: Secondary | ICD-10-CM

## 2014-07-25 DIAGNOSIS — R209 Unspecified disturbances of skin sensation: Secondary | ICD-10-CM | POA: Diagnosis not present

## 2014-07-25 DIAGNOSIS — O1414 Severe pre-eclampsia complicating childbirth: Principal | ICD-10-CM | POA: Diagnosis present

## 2014-07-25 DIAGNOSIS — O99214 Obesity complicating childbirth: Secondary | ICD-10-CM

## 2014-07-25 DIAGNOSIS — O163 Unspecified maternal hypertension, third trimester: Secondary | ICD-10-CM

## 2014-07-25 DIAGNOSIS — R42 Dizziness and giddiness: Secondary | ICD-10-CM

## 2014-07-25 DIAGNOSIS — O99844 Bariatric surgery status complicating childbirth: Secondary | ICD-10-CM | POA: Diagnosis present

## 2014-07-25 DIAGNOSIS — R5381 Other malaise: Secondary | ICD-10-CM | POA: Diagnosis not present

## 2014-07-25 DIAGNOSIS — R5383 Other fatigue: Secondary | ICD-10-CM

## 2014-07-25 DIAGNOSIS — Z6839 Body mass index (BMI) 39.0-39.9, adult: Secondary | ICD-10-CM

## 2014-07-25 DIAGNOSIS — E669 Obesity, unspecified: Secondary | ICD-10-CM | POA: Diagnosis present

## 2014-07-25 LAB — URINALYSIS, ROUTINE W REFLEX MICROSCOPIC
Bilirubin Urine: NEGATIVE
GLUCOSE, UA: NEGATIVE mg/dL
Hgb urine dipstick: NEGATIVE
Ketones, ur: NEGATIVE mg/dL
LEUKOCYTES UA: NEGATIVE
Nitrite: NEGATIVE
PH: 5.5 (ref 5.0–8.0)
Protein, ur: NEGATIVE mg/dL
Specific Gravity, Urine: <1.005 — ABNORMAL LOW (ref 1.005–1.030)
Urobilinogen, UA: 0.2 mg/dL (ref 0.0–1.0)

## 2014-07-25 LAB — BASIC METABOLIC PANEL
ANION GAP: 10 (ref 5–15)
BUN: 8 mg/dL (ref 6–23)
CHLORIDE: 104 meq/L (ref 96–112)
CO2: 20 meq/L (ref 19–32)
CREATININE: 0.67 mg/dL (ref 0.50–1.10)
Calcium: 8.3 mg/dL — ABNORMAL LOW (ref 8.4–10.5)
GFR calc Af Amer: >90 mL/min (ref >90)
GFR calc non Af Amer: >90 mL/min (ref >90)
Glucose, Bld: 89 mg/dL (ref 70–99)
Potassium: 4.9 mEq/L (ref 3.7–5.3)
Sodium: 134 mEq/L — ABNORMAL LOW (ref 137–147)

## 2014-07-25 LAB — CBC
HEMATOCRIT: 29.7 % — AB (ref 36.0–46.0)
Hemoglobin: 10.1 g/dL — ABNORMAL LOW (ref 12.0–15.0)
MCH: 31.7 pg (ref 26.0–34.0)
MCHC: 34 g/dL (ref 30.0–36.0)
MCV: 93.1 fL (ref 78.0–100.0)
PLATELETS: 202 10*3/uL (ref 150–400)
RBC: 3.19 MIL/uL — AB (ref 3.87–5.11)
RDW: 12.3 % (ref 11.5–15.5)
WBC: 9.6 10*3/uL (ref 4.0–10.5)

## 2014-07-25 MED ORDER — MECLIZINE HCL 25 MG PO TABS
25.0000 mg | ORAL_TABLET | Freq: Once | ORAL | Status: AC
  Administered 2014-07-25: 25 mg via ORAL
  Filled 2014-07-25: qty 1

## 2014-07-25 NOTE — Discharge Instructions (Signed)
Benign Positional Vertigo Vertigo means you feel like you or your surroundings are moving when they are not. Benign positional vertigo is the most common form of vertigo. Benign means that the cause of your condition is not serious. Benign positional vertigo is more common in older adults. CAUSES  Benign positional vertigo is the result of an upset in the labyrinth system. This is an area in the middle ear that helps control your balance. This may be caused by a viral infection, head injury, or repetitive motion. However, often no specific cause is found. SYMPTOMS  Symptoms of benign positional vertigo occur when you move your head or eyes in different directions. Some of the symptoms may include:  Loss of balance and falls.  Vomiting.  Blurred vision.  Dizziness.  Nausea.  Involuntary eye movements (nystagmus). DIAGNOSIS  Benign positional vertigo is usually diagnosed by physical exam. If the specific cause of your benign positional vertigo is unknown, your caregiver may perform imaging tests, such as magnetic resonance imaging (MRI) or computed tomography (CT). TREATMENT  Your caregiver may recommend movements or procedures to correct the benign positional vertigo. Medicines such as meclizine, benzodiazepines, and medicines for nausea may be used to treat your symptoms. In rare cases, if your symptoms are caused by certain conditions that affect the inner ear, you may need surgery. HOME CARE INSTRUCTIONS   Follow your caregiver's instructions.  Move slowly. Do not make sudden body or head movements.  Avoid driving.  Avoid operating heavy machinery.  Avoid performing any tasks that would be dangerous to you or others during a vertigo episode.  Drink enough fluids to keep your urine clear or pale yellow. SEEK IMMEDIATE MEDICAL CARE IF:   You develop problems with walking, weakness, numbness, or using your arms, hands, or legs.  You have difficulty speaking.  You develop  severe headaches.  Your nausea or vomiting continues or gets worse.  You develop visual changes.  Your family or friends notice any behavioral changes.  Your condition gets worse.  You have a fever.  You develop a stiff neck or sensitivity to light. MAKE SURE YOU:   Understand these instructions.  Will watch your condition.  Will get help right away if you are not doing well or get worse. Document Released: 08/30/2006 Document Revised: 02/14/2012 Document Reviewed: 08/12/2011 [employer] Patient Information 2015 [employer]. This information is not intended to replace advice given to you by your health care provider. Make sure you discuss any questions you have with your health care provider.    

## 2014-07-25 NOTE — MAU Provider Note (Signed)
None     Chief Complaint:  Shortness of Breath and Dizziness   Penny [name] is  28 y.o. G3P1011 at [redacted]w[redacted]d presents complaining of Shortness of Breath and Dizziness She tells me the dizziness has been going on for several days but was much worse this AM. She's had this dizziness before and she states that the only thing that helped her was "being here for 3 days." The dizziness is worse with movement but is worse with movement. No overheating. She drinks 5 bottles of water day. No syncope or near syncope  She also notes that she's been SOB which began today.  She is SOB with movement and at rest. She tells me her Hgb dropped from 12 to 10 in 2 weeks which could be the cause, however denies any active bleeding source. No hematuria, hematochezia, or melena.   Obstetrical/Gynecological History: OB History   Grav Para Term Preterm Abortions TAB SAB Ect Mult Living   '3 1 1  1  1   1     '$ Past Medical History: Past Medical History  Diagnosis Date  . Migraines   . Interstitial cystitis   . IUD FEB 2010  . Gastritis JULY 2011  . Depression   . 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 Fatty liver   . Obesity (BMI 30-39.9) 2011 228 LBS BMI 36.8  . Iron deficiency anemia 07/23/2010  . Irritable bowel syndrome 2012 DIARRHEA    JUN 2012 TTG IgA 14.9  . Elevated liver enzymes JUL 2011ALK PHOS 111-127 AST  143-267 ALT  213-321T BILI 0.6  ALB  3.7-4.06 Jun 2011 ALK PHOS 118 AST 24 ALT 42 T BILI 0.4 ALB 3.9  . Chronic daily headache   . Ovarian cyst   . PONV (postoperative nausea and vomiting)   . ADD (attention deficit disorder)   . RLQ abdominal pain 07/18/2013  . Menorrhagia 07/18/2013  . Anxiety   . Potassium (K) deficiency   . Dysmenorrhea 09/18/2013  . Stress 09/18/2013  . Patient desires pregnancy 09/18/2013  . Pregnant 12/25/2013  . Blood transfusion without reported diagnosis   . Heart murmur   . Gastric bypass status for obesity     Past Surgical History: Past Surgical  History  Procedure Laterality Date  . Gab  2007    in [place]-POUCH 5 CM  . Cholecystectomy  2005    biliary dyskinesia  . Cath self every nite      for sodium bicarb injection (discontinued 2013)  . Colonoscopy  JUN 2012 ABD PN/DIARRHEA WITH PROPOFOL    NL COLON  . Upper gastrointestinal endoscopy  JULY 2011 NAUSEA-D125,V6, PH 25    Bx; GASTRITIS, POUCH-5 CM LONG  . Dilation and curettage of uterus    . Tonsillectomy    . Gastric bypass  06/2006  . Savory dilation  06/20/2012    JIR:CVEL gastritis/Ulcer in the mid jejunum  . Tonsillectomy and adenoidectomy    . Esophagogastroduodenoscopy    . Wisdom tooth extraction      Family History: Family History  Problem Relation Age of Onset  . Hemochromatosis Maternal Grandmother   . Migraines Maternal Grandmother   . Cancer Maternal Grandmother   . Hypertension Father   . Diabetes Father   . Coronary artery disease Father   . Migraines Paternal Grandmother   . Cancer Mother     breast  . Hemochromatosis Mother   . Coronary artery disease Paternal Grandfather  Social History: History  Substance Use Topics  . Smoking status: Former Smoker -- 5 years    Types: Cigarettes    Quit date: 01/07/2014  . Smokeless tobacco: Never Used  . Alcohol Use: No    Allergies:  Allergies  Allergen Reactions  . Gabapentin Other (See Comments)    Pt states that she was unresponsive after taking this medication, but her vitals remained stable.    . Metoclopramide Hcl Anxiety and Other (See Comments)    Pt states that she felt like she was trapped in a box, and could not get out.  Pt also states that she had temporary loss of movement, weakness, and tingling.    . Topamax [Topiramate] Shortness Of Breath and Other (See Comments)    Adverse side effects.  Pt states this medication changed her taste for a short period of time and caused numbness and tingling in extremities.    . Codeine Nausea And Vomiting  . Latex Rash  . Tape Itching  and Rash    Meds:  Prescriptions prior to admission  Medication Sig Dispense Refill  . acetaminophen (TYLENOL) 500 MG tablet Take 1,000 mg by mouth every 6 (six) hours as needed for pain.      . Ascorbic Acid (VITAMIN C) 100 MG CHEW Chew 2 tablets by mouth daily.      Marland Kitchen BIOTIN PO Take 1 tablet by mouth daily.       . butalbital-acetaminophen-caffeine (FIORICET, ESGIC) 50-325-40 MG per tablet TAKE 1 TO 2 TABLETS EVERY 6 HOURS AS NEEDED.  20 tablet  1  . calcium carbonate (TUMS - DOSED IN MG ELEMENTAL CALCIUM) 500 MG chewable tablet Chew 3 tablets by mouth daily as needed for indigestion or heartburn.      . Cholecalciferol (VITAMIN D3) 2000 UNITS TABS Take 1 tablet by mouth daily.      . clonazePAM (KLONOPIN) 0.5 MG tablet Take 1 tablet (0.5 mg total) by mouth 2 (two) times daily as needed for anxiety.  30 tablet  1  . escitalopram (LEXAPRO) 20 MG tablet Take 1 tablet (20 mg total) by mouth daily.  30 tablet  11  . hydrOXYzine (VISTARIL) 25 MG capsule TAKE ONE CAPSULE BY MOUTH TWICE DAILY AS NEEDED.  30 capsule  1  . labetalol (NORMODYNE) 100 MG tablet Take 1 tablet (100 mg total) by mouth 2 (two) times daily.  60 tablet  11  . Multiple Vitamins-Minerals (ZINC PO) Take by mouth daily.      . promethazine (PHENERGAN) 25 MG tablet TAKE ONE TABLET BY MOUTH EVERY 6 HOURS AS NEEDED FOR NAUSEA.  22 tablet  1  . tiZANidine (ZANAFLEX) 4 MG capsule 1 twice daily as needed  30 capsule  1  . nystatin (MYCOSTATIN) 100000 UNIT/ML suspension TAKE 5 MLS BY MOUTH FOUR TIMES DAILY.  140 mL  3  . Prenatal Vit-Fe Fumarate-FA (PRENATAL MULTIVITAMIN) TABS Take 1 tablet by mouth at bedtime.         Review of Systems -   Review of Systems  Constitutional: Negative for fever, chills, weight loss, malaise/fatigue and diaphoresis.  HENT: Negative for hearing loss, ear pain, nosebleeds, congestion, sore throat, neck pain, tinnitus and ear discharge.   Eyes: Negative for blurred vision, double vision, photophobia,  pain, discharge and redness.  Respiratory: Negative for cough, hemoptysis, sputum production, shortness of breath, wheezing and stridor.   Cardiovascular: Negative for chest pain, palpitations, orthopnea,  leg swelling  Gastrointestinal: Negative for abdominal pain heartburn, nausea, vomiting,  diarrhea, constipation, blood in stool Genitourinary: Pelvic pain with starting and stopping urination, no dysuria Skin: Negative for itching and rash.  Neurological: Negative for dizziness, tingling, tremors, sensory change, speech change, focal weakness, seizures, loss of consciousness, weakness and headaches.  Endo/Heme/Allergies: Negative for environmental allergies and polydipsia. Does not bruise/bleed easily.  Psychiatric/Behavioral: Negative for depression, suicidal ideas, hallucinations, memory loss and substance abuse. The patient is not nervous/anxious and does not have insomnia.      Physical Exam  Blood pressure 135/88, pulse 71, temperature 98.1 F (36.7 C), temperature source Oral, resp. rate 18, height $RemoveBe'5\' 6"'mHXPuSFPc$  (1.676 m), weight 107.049 kg (236 lb), last menstrual period 11/19/2013. GENERAL: Well-developed, well-nourished female in no acute distress.  LUNGS: Clear to auscultation bilaterally.  HEART: Regular rate and rhythm. ABDOMEN: Soft, nontender, nondistended, gravid.  EXTREMITIES: Nontender, no edema, 2+ distal pulses. DTR's 2+ FHT:  Baseline rate 145 bpm   Variability moderate  Accelerations none Decelerations none         Results for orders placed during the hospital encounter of 07/25/14 (from the past 72 hour(s))  URINALYSIS, ROUTINE W REFLEX MICROSCOPIC     Status: Abnormal   Collection Time    07/25/14  5:54 PM      Result Value Ref Range   Color, Urine YELLOW  YELLOW   APPearance CLEAR  CLEAR   Specific Gravity, Urine <1.005 (*) 1.005 - 1.030   pH 5.5  5.0 - 8.0   Glucose, UA NEGATIVE  NEGATIVE mg/dL   Hgb urine dipstick NEGATIVE  NEGATIVE   Bilirubin Urine NEGATIVE   NEGATIVE   Ketones, ur NEGATIVE  NEGATIVE mg/dL   Protein, ur NEGATIVE  NEGATIVE mg/dL   Urobilinogen, UA 0.2  0.0 - 1.0 mg/dL   Nitrite NEGATIVE  NEGATIVE   Leukocytes, UA NEGATIVE  NEGATIVE   Comment: MICROSCOPIC NOT DONE ON URINES WITH NEGATIVE PROTEIN, BLOOD, LEUKOCYTES, NITRITE, OR GLUCOSE <1000 mg/dL.  CBC     Status: Abnormal   Collection Time    07/25/14  7:15 PM      Result Value Ref Range   WBC 9.6  4.0 - 10.5 K/uL   RBC 3.19 (*) 3.87 - 5.11 MIL/uL   Hemoglobin 10.1 (*) 12.0 - 15.0 g/dL   HCT 29.7 (*) 36.0 - 46.0 %   MCV 93.1  78.0 - 100.0 fL   MCH 31.7  26.0 - 34.0 pg   MCHC 34.0  30.0 - 36.0 g/dL   RDW 12.3  11.5 - 15.5 %   Platelets 202  150 - 400 K/uL  BASIC METABOLIC PANEL     Status: Abnormal   Collection Time    07/25/14  7:15 PM      Result Value Ref Range   Sodium 134 (*) 137 - 147 mEq/L   Potassium 4.9  3.7 - 5.3 mEq/L   Chloride 104  96 - 112 mEq/L   CO2 20  19 - 32 mEq/L   Glucose, Bld 89  70 - 99 mg/dL   BUN 8  6 - 23 mg/dL   Creatinine, Ser 0.67  0.50 - 1.10 mg/dL   Calcium 8.3 (*) 8.4 - 10.5 mg/dL   GFR calc non Af Amer >90  >90 mL/min   GFR calc Af Amer >90  >90 mL/min   Comment: (NOTE)     The eGFR has been calculated using the CKD EPI equation.     This calculation has not been validated in all clinical situations.  eGFR's persistently <90 mL/min signify possible Chronic Kidney     Disease.   Anion gap 10  5 - 15     Assessment: Penny [name] is  28 y.o. G3P1011 at [redacted]w[redacted]d presents with dizziness, which has been present in the past. She asks me if I'll be starting fluids however I tell her I'd like to see her fluid status from U/A and BMP prior.  Plan: - BMP -CBC to assess anemia as a possible source, - meclizine once to assess response. -- Treatment transferred to [facility]  [facility],Kila Godina 8/20/20159:43 PM   Pt did not have a good response to meds, still has some dizziness.  No obvious cause.  Cx checked by Idolina Primer, MD and found to be FT and thick.  Pt d/c'd home.

## 2014-07-26 ENCOUNTER — Other Ambulatory Visit: Payer: Self-pay | Admitting: Obstetrics & Gynecology

## 2014-07-26 ENCOUNTER — Ambulatory Visit (INDEPENDENT_AMBULATORY_CARE_PROVIDER_SITE_OTHER): Payer: Medicaid Other | Admitting: Obstetrics & Gynecology

## 2014-07-26 ENCOUNTER — Ambulatory Visit (INDEPENDENT_AMBULATORY_CARE_PROVIDER_SITE_OTHER): Payer: Medicaid Other

## 2014-07-26 ENCOUNTER — Encounter: Payer: Self-pay | Admitting: Obstetrics & Gynecology

## 2014-07-26 VITALS — BP 150/95 | Wt 233.0 lb

## 2014-07-26 DIAGNOSIS — O288 Other abnormal findings on antenatal screening of mother: Secondary | ICD-10-CM

## 2014-07-26 DIAGNOSIS — O09899 Supervision of other high risk pregnancies, unspecified trimester: Secondary | ICD-10-CM

## 2014-07-26 DIAGNOSIS — Z1389 Encounter for screening for other disorder: Secondary | ICD-10-CM

## 2014-07-26 DIAGNOSIS — O139 Gestational [pregnancy-induced] hypertension without significant proteinuria, unspecified trimester: Secondary | ICD-10-CM

## 2014-07-26 DIAGNOSIS — O133 Gestational [pregnancy-induced] hypertension without significant proteinuria, third trimester: Secondary | ICD-10-CM

## 2014-07-26 DIAGNOSIS — O289 Unspecified abnormal findings on antenatal screening of mother: Secondary | ICD-10-CM

## 2014-07-26 DIAGNOSIS — Z331 Pregnant state, incidental: Secondary | ICD-10-CM

## 2014-07-26 LAB — POCT URINALYSIS DIPSTICK
Blood, UA: NEGATIVE
GLUCOSE UA: NEGATIVE
Ketones, UA: NEGATIVE
LEUKOCYTES UA: NEGATIVE
NITRITE UA: NEGATIVE
Protein, UA: NEGATIVE

## 2014-07-26 MED ORDER — ZOLPIDEM TARTRATE 10 MG PO TABS: 10.0000 mg | ORAL_TABLET | Freq: Every evening | ORAL | Status: DC | PRN

## 2014-07-26 NOTE — Progress Notes (Signed)
Non reactive NST BPP 8/10   High Risk Pregnancy Diagnosis(es):   Gestational hypertension  G3P1011 [redacted]w[redacted]d Estimated Date of Delivery: 09/02/14  Blood pressure 170/110, weight 233 lb (105.688 kg), last menstrual period 11/19/2013.  Urinalysis: Negative   HPI: Pt is very lethargic today, went to [facility] for dizzy and weak.  Evalution was negative, notes/ labs/strip reviewed Today same After talking a while i think pt may be over meidcating with the zanaflex and beta blocker together, says she can't sleep Will e prescribe Lorrin Mais and have pt stop her zanaflex, don't want her to take flexeril either so as not to confuse the clinical picture    BP weight and urine results all reviewed and noted. Patient reports good fetal movement, denies any bleeding and no rupture of membranes symptoms or regular contractions.  Fundal Height:  40 Fetal Heart rate:  140 Edema:  trace  Patient is without complaints. All questions were answered.  Assessment:  [redacted]w[redacted]d,   Gestational hypertension  Medication(s) Plans:  Stop zanaflex  Treatment Plan:  Continue labetalol for now, was considering switching to aldomet because of response, ambien 10  Follow up in Monday weeks for OB appt, sonogram

## 2014-07-26 NOTE — Progress Notes (Signed)
U/S-vtx active fetus BPP 8/8, fluid WNL AFI-12.4cm SDP-4.5cm, FHR-148 bpm

## 2014-07-26 NOTE — Progress Notes (Signed)
Pt removed self from monitor

## 2014-07-27 ENCOUNTER — Encounter (HOSPITAL_COMMUNITY): Payer: Self-pay

## 2014-07-27 ENCOUNTER — Inpatient Hospital Stay (HOSPITAL_COMMUNITY): Payer: Medicaid Other

## 2014-07-27 ENCOUNTER — Inpatient Hospital Stay (HOSPITAL_COMMUNITY)
Admission: AD | Admit: 2014-07-27 | Discharge: 2014-08-01 | DRG: 774 | Disposition: A | Discharge status: Home / Self Care | Payer: Medicaid Other | Source: Ambulatory Visit | Attending: Obstetrics & Gynecology | Admitting: Obstetrics & Gynecology

## 2014-07-27 ENCOUNTER — Inpatient Hospital Stay (HOSPITAL_COMMUNITY)
Admission: AD | Admit: 2014-07-27 | Discharge: 2014-07-27 | Discharge status: Against Medical Advice | Payer: Medicaid Other | Source: Ambulatory Visit | Attending: Obstetrics & Gynecology | Admitting: Obstetrics & Gynecology

## 2014-07-27 DIAGNOSIS — O99214 Obesity complicating childbirth: Secondary | ICD-10-CM | POA: Diagnosis present

## 2014-07-27 DIAGNOSIS — O9902 Anemia complicating childbirth: Secondary | ICD-10-CM | POA: Diagnosis present

## 2014-07-27 DIAGNOSIS — O1493 Unspecified pre-eclampsia, third trimester: Secondary | ICD-10-CM

## 2014-07-27 DIAGNOSIS — R5381 Other malaise: Secondary | ICD-10-CM | POA: Diagnosis not present

## 2014-07-27 DIAGNOSIS — O1414 Severe pre-eclampsia complicating childbirth: Secondary | ICD-10-CM | POA: Diagnosis not present

## 2014-07-27 DIAGNOSIS — O99844 Bariatric surgery status complicating childbirth: Secondary | ICD-10-CM | POA: Diagnosis not present

## 2014-07-27 DIAGNOSIS — O133 Gestational [pregnancy-induced] hypertension without significant proteinuria, third trimester: Secondary | ICD-10-CM

## 2014-07-27 DIAGNOSIS — R5383 Other fatigue: Secondary | ICD-10-CM | POA: Diagnosis not present

## 2014-07-27 DIAGNOSIS — Z6839 Body mass index (BMI) 39.0-39.9, adult: Secondary | ICD-10-CM | POA: Diagnosis not present

## 2014-07-27 DIAGNOSIS — E669 Obesity, unspecified: Secondary | ICD-10-CM | POA: Diagnosis present

## 2014-07-27 DIAGNOSIS — O1490 Unspecified pre-eclampsia, unspecified trimester: Secondary | ICD-10-CM | POA: Diagnosis present

## 2014-07-27 DIAGNOSIS — R109 Unspecified abdominal pain: Secondary | ICD-10-CM | POA: Diagnosis present

## 2014-07-27 DIAGNOSIS — D649 Anemia, unspecified: Secondary | ICD-10-CM | POA: Diagnosis present

## 2014-07-27 DIAGNOSIS — R209 Unspecified disturbances of skin sensation: Secondary | ICD-10-CM | POA: Diagnosis not present

## 2014-07-27 DIAGNOSIS — R200 Anesthesia of skin: Secondary | ICD-10-CM

## 2014-07-27 LAB — URINALYSIS, ROUTINE W REFLEX MICROSCOPIC
Bilirubin Urine: NEGATIVE
Glucose, UA: NEGATIVE mg/dL
Hgb urine dipstick: NEGATIVE
Ketones, ur: 40 mg/dL — AB
Leukocytes, UA: NEGATIVE
NITRITE: NEGATIVE
Protein, ur: 100 mg/dL — AB
Specific Gravity, Urine: 1.025 (ref 1.005–1.030)
Urobilinogen, UA: 1 mg/dL (ref 0.0–1.0)
pH: 6 (ref 5.0–8.0)

## 2014-07-27 LAB — PROTEIN / CREATININE RATIO, URINE
Creatinine, Urine: 328.66 mg/dL
Protein Creatinine Ratio: 0.33 — ABNORMAL HIGH (ref 0.00–0.15)
TOTAL PROTEIN, URINE: 108.9 mg/dL

## 2014-07-27 LAB — URINE MICROSCOPIC-ADD ON

## 2014-07-27 LAB — CBC
HCT: 32.1 % — ABNORMAL LOW (ref 36.0–46.0)
Hemoglobin: 10.5 g/dL — ABNORMAL LOW (ref 12.0–15.0)
MCH: 30.4 pg (ref 26.0–34.0)
MCHC: 32.7 g/dL (ref 30.0–36.0)
MCV: 93 fL (ref 78.0–100.0)
PLATELETS: 224 10*3/uL (ref 150–400)
RBC: 3.45 MIL/uL — AB (ref 3.87–5.11)
RDW: 12.6 % (ref 11.5–15.5)
WBC: 9 10*3/uL (ref 4.0–10.5)

## 2014-07-27 LAB — COMPREHENSIVE METABOLIC PANEL
ALT: 10 U/L (ref 0–35)
AST: 16 U/L (ref 0–37)
Albumin: 2.7 g/dL — ABNORMAL LOW (ref 3.5–5.2)
Alkaline Phosphatase: 159 U/L — ABNORMAL HIGH (ref 39–117)
Anion gap: 13 (ref 5–15)
BILIRUBIN TOTAL: 0.5 mg/dL (ref 0.3–1.2)
BUN: 14 mg/dL (ref 6–23)
CALCIUM: 8.4 mg/dL (ref 8.4–10.5)
CHLORIDE: 109 meq/L (ref 96–112)
CO2: 18 meq/L — AB (ref 19–32)
Creatinine, Ser: 0.64 mg/dL (ref 0.50–1.10)
GFR calc Af Amer: >90 mL/min (ref >90)
GFR calc non Af Amer: >90 mL/min (ref >90)
Glucose, Bld: 76 mg/dL (ref 70–99)
Potassium: 4.1 mEq/L (ref 3.7–5.3)
SODIUM: 140 meq/L (ref 137–147)
Total Protein: 5.6 g/dL — ABNORMAL LOW (ref 6.0–8.3)

## 2014-07-27 MED ORDER — DIPHENHYDRAMINE HCL 50 MG/ML IJ SOLN
25.0000 mg | Freq: Once | INTRAMUSCULAR | Status: AC
  Administered 2014-07-28: 25 mg via INTRAVENOUS
  Filled 2014-07-27: qty 1

## 2014-07-27 MED ORDER — ACETAMINOPHEN 325 MG PO TABS: 650.0000 mg | ORAL_TABLET | ORAL | Status: DC | PRN

## 2014-07-27 MED ORDER — CALCIUM CARBONATE ANTACID 500 MG PO CHEW
2.0000 | CHEWABLE_TABLET | ORAL | Status: DC | PRN
  Filled 2014-07-27: qty 2

## 2014-07-27 MED ORDER — PROMETHAZINE HCL 25 MG/ML IJ SOLN: 25.0000 mg | Freq: Four times a day (QID) | INTRAMUSCULAR | Status: DC | PRN

## 2014-07-27 MED ORDER — PROMETHAZINE HCL 25 MG/ML IJ SOLN
25.0000 mg | Freq: Once | INTRAMUSCULAR | Status: AC
  Administered 2014-07-28: 25 mg via INTRAVENOUS
  Filled 2014-07-27: qty 1

## 2014-07-27 MED ORDER — LABETALOL HCL 100 MG PO TABS: 100.0000 mg | ORAL_TABLET | Freq: Two times a day (BID) | ORAL | Status: DC

## 2014-07-27 MED ORDER — MAGNESIUM SULFATE 40 G IN LACTATED RINGERS - SIMPLE
2.0000 g/h | INTRAVENOUS | Status: AC
  Administered 2014-07-28: 2 g/h via INTRAVENOUS
  Filled 2014-07-27 (×2): qty 500

## 2014-07-27 MED ORDER — CLONAZEPAM 1 MG PO TABS
0.5000 mg | ORAL_TABLET | Freq: Two times a day (BID) | ORAL | Status: DC | PRN
  Administered 2014-07-28 – 2014-07-30 (×3): 0.5 mg via ORAL
  Filled 2014-07-27 (×3): qty 1

## 2014-07-27 MED ORDER — HYDRALAZINE HCL 20 MG/ML IJ SOLN: 10.0000 mg | INTRAMUSCULAR | Status: DC | PRN

## 2014-07-27 MED ORDER — BUTORPHANOL TARTRATE 1 MG/ML IJ SOLN
1.0000 mg | Freq: Once | INTRAMUSCULAR | Status: AC
  Administered 2014-07-27: 1 mg via INTRAMUSCULAR
  Filled 2014-07-27: qty 1

## 2014-07-27 MED ORDER — LABETALOL HCL 300 MG PO TABS
400.0000 mg | ORAL_TABLET | Freq: Two times a day (BID) | ORAL | Status: DC
  Administered 2014-07-27: 400 mg via ORAL
  Filled 2014-07-27 (×2): qty 1

## 2014-07-27 MED ORDER — MAGNESIUM SULFATE BOLUS VIA INFUSION
4.0000 g | Freq: Once | INTRAVENOUS | Status: AC
  Administered 2014-07-27: 4 g via INTRAVENOUS
  Filled 2014-07-27: qty 500

## 2014-07-27 MED ORDER — LABETALOL HCL 100 MG PO TABS
100.0000 mg | ORAL_TABLET | Freq: Once | ORAL | Status: AC
  Administered 2014-07-27: 100 mg via ORAL
  Filled 2014-07-27: qty 1

## 2014-07-27 MED ORDER — DOCUSATE SODIUM 100 MG PO CAPS
100.0000 mg | ORAL_CAPSULE | Freq: Every day | ORAL | Status: DC
  Administered 2014-07-30 – 2014-08-01 (×3): 100 mg via ORAL
  Filled 2014-07-27 (×5): qty 1

## 2014-07-27 MED ORDER — PRENATAL MULTIVITAMIN CH
1.0000 | ORAL_TABLET | Freq: Every day | ORAL | Status: DC
  Filled 2014-07-27: qty 1

## 2014-07-27 MED ORDER — FENTANYL CITRATE 0.05 MG/ML IJ SOLN
100.0000 ug | Freq: Once | INTRAMUSCULAR | Status: AC
  Administered 2014-07-28: 100 ug via INTRAVENOUS
  Filled 2014-07-27: qty 2

## 2014-07-27 MED ORDER — PROMETHAZINE HCL 25 MG/ML IJ SOLN
25.0000 mg | Freq: Once | INTRAMUSCULAR | Status: AC
  Administered 2014-07-27: 25 mg via INTRAMUSCULAR
  Filled 2014-07-27: qty 1

## 2014-07-27 MED ORDER — ZOLPIDEM TARTRATE 5 MG PO TABS: 5.0000 mg | ORAL_TABLET | Freq: Every evening | ORAL | Status: DC | PRN

## 2014-07-27 NOTE — MAU Note (Signed)
Pt presents complaining of contractions every 2 minutes that started at 1400. Pt states she's not sure if she's bleeding and leaking from vagina or rectum. Pt reports no fetal movement since she was in MAU this am.

## 2014-07-27 NOTE — MAU Note (Signed)
Report called to antenatal nurse. Will go to room 155

## 2014-07-27 NOTE — Progress Notes (Signed)
Pt requesting to speak with dr before discharge to home

## 2014-07-27 NOTE — MAU Note (Signed)
Received report and assumed care of patient.

## 2014-07-27 NOTE — MAU Provider Note (Signed)
None     Chief Complaint:  Back Pain and Hypertension   Penny [name] is  28 y.o. G3P1011 at 36w5dpresents complaining of Back Pain and Hypertension While I'm in the room, she endorses diarrhea x 3 days. She tells me the diarrhea is watery and red. She has felt a little feverish but denies chills. She tells me her HTN has been stable. She continues to have some dizziness that she presented with in the ED a few days ago that was unresolved with meclizine. No change in urination or RUQ pain.   No vaginal bleeding that she knows of. She does feel like she's been leaking clear urine which now requires her to wear a pad. She does not think that her water has broken, as they normally have to break it.  This has been going on for approximately 3 days.  She does not endorse back pain to me at all this encounter, however her RN notes that she was endorsing it on arrival. When I ask the patient, she tells me it is stable from her previous visits from the MAU. No dysuria noted.  She feels she has irregular contractions. Good fetal movement.    Obstetrical/Gynecological History: OB History   Grav Para Term Preterm Abortions TAB SAB Ect Mult Living   _0 Past Medical History: Past Medical History  Diagnosis Date  . Migraines   . Interstitial cystitis   . IUD FEB 2010  . Gastritis JULY 2011  . Depression   . 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 Fatty liver   . Obesity (BMI 30-39.9) 2011 228 LBS BMI 36.8  . Iron deficiency anemia 07/23/2010  . Irritable bowel syndrome 2012 DIARRHEA    JUN 2012 TTG IgA 14.9  . Elevated liver enzymes JUL 2011ALK PHOS 111-127 AST  143-267 ALT  213-321T BILI 0.6  ALB  3.7-4.06 Jun 2011 ALK PHOS 118 AST 24 ALT 42 T BILI 0.4 ALB 3.9  . Chronic daily headache   . Ovarian cyst   . PONV (postoperative nausea and vomiting)   . ADD (attention deficit disorder)   . RLQ abdominal pain 07/18/2013  . Menorrhagia 07/18/2013  . Anxiety   .  Potassium (K) deficiency   . Dysmenorrhea 09/18/2013  . Stress 09/18/2013  . Patient desires pregnancy 09/18/2013  . Pregnant 12/25/2013  . Blood transfusion without reported diagnosis   . Heart murmur   . Gastric bypass status for obesity     Past Surgical History: Past Surgical History  Procedure Laterality Date  . Gab  2007    in [place]-POUCH 5 CM  . Cholecystectomy  2005    biliary dyskinesia  . Cath self every nite      for sodium bicarb injection (discontinued 2013)  . Colonoscopy  JUN 2012 ABD PN/DIARRHEA WITH PROPOFOL    NL COLON  . Upper gastrointestinal endoscopy  JULY 2011 NAUSEA-D125,V6, PH 25    Bx; GASTRITIS, POUCH-5 CM LONG  . Dilation and curettage of uterus    . Tonsillectomy    . Gastric bypass  06/2006  . Savory dilation  06/20/2012    SZOX:WRUEgastritis/Ulcer in the mid jejunum  . Tonsillectomy and adenoidectomy    . Esophagogastroduodenoscopy    . Wisdom tooth extraction      Family History: Family History  Problem Relation Age of Onset  . Hemochromatosis  Maternal Grandmother   . Migraines Maternal Grandmother   . Cancer Maternal Grandmother   . Hypertension Father   . Diabetes Father   . Coronary artery disease Father   . Migraines Paternal Grandmother   . Cancer Mother     breast  . Hemochromatosis Mother   . Coronary artery disease Paternal Grandfather     Social History: History  Substance Use Topics  . Smoking status: Former Smoker -- 5 years    Types: Cigarettes    Quit date: 01/07/2014  . Smokeless tobacco: Never Used  . Alcohol Use: No    Allergies:  Allergies  Allergen Reactions  . Gabapentin Other (See Comments)    Pt states that she was unresponsive after taking this medication, but her vitals remained stable.    . Metoclopramide Hcl Anxiety and Other (See Comments)    Pt states that she felt like she was trapped in a box, and could not get out.  Pt also states that she had temporary loss of movement, weakness, and  tingling.    . Topamax [Topiramate] Shortness Of Breath and Other (See Comments)    Adverse side effects.  Pt states this medication changed her taste for a short period of time and caused numbness and tingling in extremities.    . Codeine Nausea And Vomiting  . Latex Rash  . Tape Itching and Rash    Meds:  Prescriptions prior to admission  Medication Sig Dispense Refill  . acetaminophen (TYLENOL) 500 MG tablet Take 1,000 mg by mouth every 6 (six) hours as needed for pain.      . Ascorbic Acid (VITAMIN C) 100 MG CHEW Chew 2 tablets by mouth daily.      Marland Kitchen BIOTIN PO Take 1 tablet by mouth daily.       . butalbital-acetaminophen-caffeine (FIORICET, ESGIC) 50-325-40 MG per tablet Take 1-2 tablets by mouth every 6 (six) hours as needed for headache.      . calcium carbonate (TUMS - DOSED IN MG ELEMENTAL CALCIUM) 500 MG chewable tablet Chew 3 tablets by mouth daily as needed for indigestion or heartburn.      . Cholecalciferol (VITAMIN D3) 2000 UNITS TABS Take 1 tablet by mouth daily.      . clonazePAM (KLONOPIN) 0.5 MG tablet Take 1 tablet (0.5 mg total) by mouth 2 (two) times daily as needed for anxiety.  30 tablet  1  . escitalopram (LEXAPRO) 20 MG tablet Take 1 tablet (20 mg total) by mouth daily.  30 tablet  11  . labetalol (NORMODYNE) 100 MG tablet Take 1 tablet (100 mg total) by mouth 2 (two) times daily.  60 tablet  11  . Multiple Vitamins-Minerals (ZINC PO) Take 1 tablet by mouth daily.       Marland Kitchen nystatin (MYCOSTATIN) 100000 UNIT/ML suspension TAKE 5 MLS BY MOUTH FOUR TIMES DAILY.  140 mL  3  . Prenatal Vit-Fe Fumarate-FA (PRENATAL MULTIVITAMIN) TABS Take 1 tablet by mouth at bedtime.       . promethazine (PHENERGAN) 25 MG tablet TAKE ONE TABLET BY MOUTH EVERY 6 HOURS AS NEEDED FOR NAUSEA.  22 tablet  1  . zolpidem (AMBIEN) 10 MG tablet Take 1 tablet (10 mg total) by mouth at bedtime as needed for sleep.  15 tablet  3    Review of Systems -  All negative except that noted in  HPI   Physical Exam  Blood pressure 135/77, pulse 87, temperature 97.7 F (36.5 C), temperature source Oral, resp.  rate 16, height _0  (1.676 m), weight 106.652 kg (235 lb 2 oz), last menstrual period 11/19/2013. GENERAL: Well-developed, well-nourished female in no acute distress lying in bed. LUNGS: Clear to auscultation bilaterally.  HEART: Regular rate and rhythm. ABDOMEN: Soft, nontender, nondistended, gravid.  EXTREMITIES: Nontender, no edema, 2+ distal pulses. DTR's 2+  FHT:  Baseline rate 160 bpm   Variability moderate  Accelerations present   Decelerations none Contractions:None   Labs: Results for orders placed in visit on 07/26/14 (from the past 24 hour(s))  POCT URINALYSIS DIPSTICK   Collection Time    07/26/14 10:33 AM      Result Value Ref Range   Color, UA       Clarity, UA       Glucose, UA neg     Bilirubin, UA       Ketones, UA neg     Spec Grav, UA       Blood, UA neg     pH, UA       Protein, UA neg     Urobilinogen, UA       Nitrite, UA neg     Leukocytes, UA Negative     Imaging Studies:  US Ob Limited  07/14/2014   OBSTETRICAL ULTRASOUND: This exam was performed within a [facility]. The OB US report was generated in the AS system, and faxed to the ordering physician.   This report is available in the [facility]. See the AS Obstetric US report via the Image Link.  US Ob Follow Up  07/15/2014   FOLLOW UP SONOGRAM   Penny [name] is in the office for a follow up sonogram for GHTN.  She is a 28 y.o. year old G27P1011 with Estimated Date of Delivery: 09/02/14  now at  63w0dweeks gestation. Thus [facility]  GESTATION: SINGLETON  PRESENTATION: cephalic  FETAL ACTIVITY:          Heart rate         148 bpm          The fetus is active.  AMNIOTIC FLUID: The amniotic fluid volume is  normal, AFI-7.8 cm SDP-2.2cm.  PLACENTA LOCALIZATION:   GRADE 1  CERVIX: Unable to view  ADNEXA: The adnexa appears  normal.   GESTATIONAL AGE AND  BIOMETRICS:  Gestational criteria: Estimated Date of Delivery: 09/02/14  now at 346w0dPrevious Scans:  GESTATIONAL SAC            mm          weeks  CROWN RUMP LENGTH            mm          weeks  NUCHAL TRANSLUCENCY            mm           BIPARIETAL DIAMETER           8.4 cm         33+6 weeks  HEAD CIRCUMFERENCE           29.74 cm         32+6 weeks  ABDOMINAL CIRCUMFERENCE           27.58 cm         31+5 weeks  FEMUR LENGTH           5.94 cm         31+0 weeks  AVERAGE EGA(BY THIS SCAN):   32+2 weeks                                                 ESTIMATED FETAL WEIGHT:        1805  grams, 25 % ANATOMICAL SURVEY                                                                             COMMENTS CEREBRAL VENTRICLES yes normal   CHOROID PLEXUS yes normal   CEREBELLUM yes normal   CISTERNA MAGNA yes normal   NUCHAL REGION yes normal   ORBITS no    NASAL BONE no    NOSE/LIP no    FACIAL PROFILE no    4 CHAMBERED HEART yes normal   OUTFLOW TRACTS no    DIAPHRAGM yes normal   STOMACH yes normal   RENAL REGION no    BLADDER no    CORD INSERTION no    3 VESSEL CORD yes normal   SPINE no    ARMS/HANDS no    LEGS/FEET no    GENITALIA yes normal female        BIOPHYSCIAL PROFILE:                                                                                                       COMMENTS GROSS BODY MOVEMENT                 2   TONE                2   RESPIRATIONS                2   AMNIOTIC FLUID                2                                                            SCORE:  8/8 (Note: NST was not performed as part of this antepartum testing)   DOPPLER FLOW STUDIES: UMBILICAL ARTERY RI RATIOS:   0.74, 0.71    SUSPECTED ABNORMALITIES:  no  QUALITY OF SCAN: satisfactory  TECHNICIAN COMMENTS:  U/S(33+0wks)-vtx active fetus, EFW 4 lb (25th%tile), fluid WNL AFI-7.8cm  SDp-2.2cm, FHR-148 bpm, UA Doppler RI-0.74 & 0.71, BPP 8/8, Gr 1  placenta  (succ lobe noted)   A copy of this report including all images has been  saved and backed up to  a second source for retrieval if needed. All measures and details of the  anatomical scan, placentation, fluid volume and pelvic anatomy are  contained in that report.  Lazarus Gowda 07/15/2014 11:38 AM  Clinical Impression and recommendations:  I have reviewed the sonogram results above, combined with the patient's  current clinical course, below are my impressions and any appropriate  recommendations for management based on the sonographic findings.   1.  [id number] Estimated Date of Delivery: 09/02/14 by  early ultrasound,  midtrimester ultrasound and confirmed by today's sonographic dating 2.  Normal fetal sonographic findings, normal fluid, normal Doppler flow  and normal fetal surveillance 3.  Normal general sonographic findings  Recommend routine prenatal care based on this sonogram or as clinically  indicated  EURE,LUTHER H 07/15/2014 12:00 PM        US Fetal Bpp W/o Non Stress  07/26/2014   OBSTETRICAL ULTRASOUND: This exam was performed within a [place] [facility]. The OB US report was generated in the AS system, and faxed to the ordering physician.   This report is available in the [facility]. See the AS Obstetric US report via the Image Link.  US Fetal Bpp W/o Non Stress  07/23/2014   FOLLOW UP SONOGRAM   Penny [name] is in the office for a follow up sonogram for BPP/AFI/UAD.  She is a 28 y.o. year old G62P1011 with Estimated Date of Delivery: 09/02/14  now at  79w1dweeks gestation. [facility]  GESTATION: SINGLETON  PRESENTATION: cephalic  FETAL ACTIVITY:          Heart rate         163 bpm          The fetus is active.  AMNIOTIC FLUID: The amniotic fluid volume is  normal, 13.7 cm SDP-5.2cm.  PLACENTA LOCALIZATION:  anterior with succ lobe posterior GRADE 2  CERVIX: N/A  ADNEXA: Adnexa appears WNL  GESTATIONAL AGE AND  BIOMETRICS:   Gestational criteria: Estimated Date of Delivery: 09/02/14  now at 329w1d BIOPHYSCIAL PROFILE:                                                                                                       COMMENTS GROSS BODY MOVEMENT                 2   TONE                2   RESPIRATIONS                2   AMNIOTIC FLUID                2  SCORE:  8/8 (Note: NST was not performed as part of this antepartum testing)   DOPPLER FLOW STUDIES: UMBILICAL ARTERY RI RATIOS:   0.58, 0.54   SUSPECTED ABNORMALITIES:  no  QUALITY OF SCAN: satisfactory  TECHNICIAN COMMENTS:  U/S(34+1wks)-vtx active fetus BPP 8/8, fluid WNL AFI-13.7cm, FHR-163 bpm,  UA Doppler RI-0.54 & 0.58, anterior placenta with succ lobe posterior   A copy of this report including all images has been saved and backed up to  a second source for retrieval if needed. All measures and details of the  anatomical scan, placentation, fluid volume and pelvic anatomy are  contained in that report.  Lazarus Gowda 07/23/2014 1:39 PM  Clinical Impression and recommendations:  I have reviewed the sonogram results above, combined with the patient's  current clinical course, below are my impressions and any appropriate  recommendations for management based on the sonographic findings.   1.  [id number] Estimated Date of Delivery: 09/02/14 by  early ultrasound and  confirmed by today's sonographic dating 2.  Normal fetal sonographic findings, specifically normal fluid,  reassuring fetal testing/surveillance and excellent Doppler flow 3.  Normal general sonographic findings  Recommend continued twice weekly fetal surveillance  EURE,LUTHER H 07/23/2014 2:10 PM        US Fetal Bpp W/o Non Stress  07/15/2014   FOLLOW UP SONOGRAM   Penny [name] is in the office for a follow up sonogram for GHTN.  She is a 28 y.o. year old G21P1011 with Estimated Date of Delivery: 09/02/14  now at  74w0dweeks gestation. [facility]  GESTATION: SINGLETON  PRESENTATION: cephalic  FETAL ACTIVITY:          Heart rate         148 bpm          The fetus is active.  AMNIOTIC FLUID: The amniotic fluid volume is  normal, AFI-7.8 cm SDP-2.2cm.  PLACENTA LOCALIZATION:   GRADE 1  CERVIX: Unable to view  ADNEXA: The adnexa appears normal.   GESTATIONAL AGE AND  BIOMETRICS:  Gestational criteria: Estimated Date of Delivery: 09/02/14  now at 323w0dPrevious Scans:  GESTATIONAL SAC            mm          weeks  CROWN RUMP LENGTH            mm          weeks  NUCHAL TRANSLUCENCY            mm           BIPARIETAL DIAMETER           8.4 cm         33+6 weeks  HEAD CIRCUMFERENCE           29.74 cm         32+6 weeks  ABDOMINAL CIRCUMFERENCE           27.58 cm         31+5 weeks  FEMUR LENGTH           5.94 cm         31+0 weeks  AVERAGE EGA(BY THIS SCAN):   32+2 weeks                                                 ESTIMATED FETAL WEIGHT:        1805  grams, 25 % ANATOMICAL SURVEY                                                                             COMMENTS CEREBRAL VENTRICLES yes normal   CHOROID PLEXUS yes normal   CEREBELLUM yes normal   CISTERNA MAGNA yes normal   NUCHAL REGION yes normal   ORBITS no    NASAL BONE no    NOSE/LIP no    FACIAL PROFILE no    4 CHAMBERED HEART yes normal   OUTFLOW TRACTS no    DIAPHRAGM yes normal   STOMACH yes normal   RENAL REGION no    BLADDER no    CORD INSERTION no    3 VESSEL CORD yes normal   SPINE no    ARMS/HANDS no    LEGS/FEET no    GENITALIA yes normal female        BIOPHYSCIAL PROFILE:                                                                                                       COMMENTS GROSS BODY MOVEMENT                 2   TONE                2   RESPIRATIONS                2   AMNIOTIC FLUID                2                                                            SCORE:  8/8 (Note: NST was not performed as part  of this antepartum testing)   DOPPLER FLOW STUDIES: UMBILICAL ARTERY RI RATIOS:   0.74, 0.71    SUSPECTED ABNORMALITIES:  no  QUALITY OF SCAN: satisfactory  TECHNICIAN COMMENTS:  U/S(33+0wks)-vtx active fetus, EFW 4 lb (25th%tile), fluid WNL AFI-7.8cm  SDp-2.2cm, FHR-148 bpm, UA Doppler RI-0.74 & 0.71, BPP 8/8, Gr 1 placenta  (succ lobe noted)   A copy of this report including all images has been  saved and backed up to  a second source for retrieval if needed. All measures and details of the  anatomical scan, placentation, fluid volume and pelvic anatomy are  contained in that report.  Lazarus Gowda 07/15/2014 11:38 AM  Clinical Impression and recommendations:  I have reviewed the sonogram results above, combined with the patient's  current clinical course, below are my impressions and any appropriate  recommendations for management based on the sonographic findings.   1.  [id number] Estimated Date of Delivery: 09/02/14 by  early ultrasound,  midtrimester ultrasound and confirmed by today's sonographic dating 2.  Normal fetal sonographic findings, normal fluid, normal Doppler flow  and normal fetal surveillance 3.  Normal general sonographic findings  Recommend routine prenatal care based on this sonogram or as clinically  indicated  EURE,LUTHER H 07/15/2014 12:00 PM        US Fetal Bpp W/o Non Stress  07/14/2014   OBSTETRICAL ULTRASOUND: This exam was performed within a [facility]. The OB US report was generated in the AS system, and faxed to the ordering physician.   This report is available in the [facility]. See the AS Obstetric US report via the Image Link.  US Fetal Bpp W/o Non Stress  07/08/2014   OBSTETRICAL ULTRASOUND: This exam was performed within a [place] [facility]. The OB US report was generated in the AS system, and faxed to the ordering physician.   This report is available in the [facility]. See the AS Obstetric US report via the Image Link.  US  Renal  07/08/2014   CLINICAL DATA:  Evaluate for hydronephrosis  EXAM: RENAL/URINARY TRACT ULTRASOUND COMPLETE  COMPARISON:  None.  FINDINGS: Right Kidney:  Length: 11 cm. Echogenicity within normal limits. No mass or hydronephrosis visualized.  Left Kidney:  Length: 12 cm. Echogenicity within normal limits. No mass or hydronephrosis visualized.  Bladder:  Appears normal for degree of bladder distention.  IMPRESSION: Negative for hydronephrosis.   Electronically Signed   By: Jorje Guild M.D.   On: 07/08/2014 03:26   [name] Ua Cord Doppler  07/23/2014   FOLLOW UP SONOGRAM   Penny [name] is in the office for a follow up sonogram for BPP/AFI/UAD.  She is a 28 y.o. year old G45P1011 with Estimated Date of Delivery: 09/02/14  now at  37w1dweeks gestation. [facility]  GESTATION: SINGLETON  PRESENTATION: cephalic  FETAL ACTIVITY:          Heart rate         163 bpm          The fetus is active.  AMNIOTIC FLUID: The amniotic fluid volume is  normal, 13.7 cm SDP-5.2cm.  PLACENTA LOCALIZATION:  anterior with succ lobe posterior GRADE 2  CERVIX: N/A  ADNEXA: Adnexa appears WNL  GESTATIONAL AGE AND  BIOMETRICS:  Gestational criteria: Estimated Date of Delivery: 09/02/14  now at 357w1d BIOPHYSCIAL PROFILE:  COMMENTS GROSS BODY MOVEMENT                 2   TONE                2   RESPIRATIONS                2   AMNIOTIC FLUID                2                                                            SCORE:  8/8 (Note: NST was not performed as part of this antepartum testing)   DOPPLER FLOW STUDIES: UMBILICAL ARTERY RI RATIOS:   0.58, 0.54   SUSPECTED ABNORMALITIES:  no  QUALITY OF SCAN: satisfactory  TECHNICIAN COMMENTS:  U/S(34+1wks)-vtx active fetus BPP 8/8, fluid WNL AFI-13.7cm, FHR-163 bpm,  UA Doppler RI-0.54 & 0.58, anterior placenta with succ lobe posterior   A copy of this  report including all images has been saved and backed up to  a second source for retrieval if needed. All measures and details of the  anatomical scan, placentation, fluid volume and pelvic anatomy are  contained in that report.  Lazarus Gowda 07/23/2014 1:39 PM  Clinical Impression and recommendations:  I have reviewed the sonogram results above, combined with the patient's  current clinical course, below are my impressions and any appropriate  recommendations for management based on the sonographic findings.   1.  [id number] Estimated Date of Delivery: 09/02/14 by  early ultrasound and  confirmed by today's sonographic dating 2.  Normal fetal sonographic findings, specifically normal fluid,  reassuring fetal testing/surveillance and excellent Doppler flow 3.  Normal general sonographic findings  Recommend continued twice weekly fetal surveillance  EURE,LUTHER H 07/23/2014 2:10 PM        [name] Ua Cord Doppler  07/15/2014   FOLLOW UP SONOGRAM   Penny [name] is in the office for a follow up sonogram for GHTN.  She is a 28 y.o. year old G30P1011 with Estimated Date of Delivery: 09/02/14  now at  23w0dweeks gestation. [facility]  GESTATION: SINGLETON  PRESENTATION: cephalic  FETAL ACTIVITY:          Heart rate         148 bpm          The fetus is active.  AMNIOTIC FLUID: The amniotic fluid volume is  normal, AFI-7.8 cm SDP-2.2cm.  PLACENTA LOCALIZATION:   GRADE 1  CERVIX: Unable to view  ADNEXA: The adnexa appears normal.   GESTATIONAL AGE AND  BIOMETRICS:  Gestational criteria: Estimated Date of Delivery: 09/02/14  now at 368w0dPrevious Scans:  GESTATIONAL SAC            mm          weeks  CROWN RUMP LENGTH            mm          weeks  NUCHAL TRANSLUCENCY            mm           BIPARIETAL DIAMETER  8.4 cm         33+6 weeks  HEAD CIRCUMFERENCE           29.74 cm         32+6 weeks  ABDOMINAL CIRCUMFERENCE           27.58 cm         31+5 weeks  FEMUR LENGTH            5.94 cm         31+0 weeks                                                           AVERAGE EGA(BY THIS SCAN):   32+2 weeks                                                 ESTIMATED FETAL WEIGHT:        1805  grams, 25 % ANATOMICAL SURVEY                                                                             COMMENTS CEREBRAL VENTRICLES yes normal   CHOROID PLEXUS yes normal   CEREBELLUM yes normal   CISTERNA MAGNA yes normal   NUCHAL REGION yes normal   ORBITS no    NASAL BONE no    NOSE/LIP no    FACIAL PROFILE no    4 CHAMBERED HEART yes normal   OUTFLOW TRACTS no    DIAPHRAGM yes normal   STOMACH yes normal   RENAL REGION no    BLADDER no    CORD INSERTION no    3 VESSEL CORD yes normal   SPINE no    ARMS/HANDS no    LEGS/FEET no    GENITALIA yes normal female        BIOPHYSCIAL PROFILE:                                                                                                       COMMENTS GROSS BODY MOVEMENT                 2   TONE                2   RESPIRATIONS                2   AMNIOTIC FLUID  2                                                            SCORE:  8/8 (Note: NST was not performed as part of this antepartum testing)   DOPPLER FLOW STUDIES: UMBILICAL ARTERY RI RATIOS:   0.74, 0.71    SUSPECTED ABNORMALITIES:  no  QUALITY OF SCAN: satisfactory  TECHNICIAN COMMENTS:  U/S(33+0wks)-vtx active fetus, EFW 4 lb (25th%tile), fluid WNL AFI-7.8cm  SDp-2.2cm, FHR-148 bpm, UA Doppler RI-0.74 & 0.71, BPP 8/8, Gr 1 placenta  (succ lobe noted)   A copy of this report including all images has been saved and backed up to  a second source for retrieval if needed. All measures and details of the  anatomical scan, placentation, fluid volume and pelvic anatomy are  contained in that report.  Lazarus Gowda 07/15/2014 11:38 AM  Clinical Impression and recommendations:  I have reviewed the sonogram results above, combined with the patient's  current clinical course, below are my impressions  and any appropriate  recommendations for management based on the sonographic findings.   1.  [id number] Estimated Date of Delivery: 09/02/14 by  early ultrasound,  midtrimester ultrasound and confirmed by today's sonographic dating 2.  Normal fetal sonographic findings, normal fluid, normal Doppler flow  and normal fetal surveillance 3.  Normal general sonographic findings  Recommend routine prenatal care based on this sonogram or as clinically  indicated  EURE,LUTHER H 07/15/2014 12:00 PM        US Fetal Bpp W/non Stress  07/26/2014   FOLLOW UP SONOGRAM   Penny [name] is in the office for a follow up sonogram for Nr NST.  She is a 28 y.o. year old G4P1011 with Estimated Date of Delivery: 09/02/14   now at  54w4dweeks gestation. Thus far the pregnancy has been  complicated by poor hx.  GESTATION: SINGLETON  PRESENTATION: cephalic  FETAL ACTIVITY:          Heart rate         148 bpm          The fetus is active.  AMNIOTIC FLUID: The amniotic fluid volume is  normal, 12.4 cm SDP-4.5cm.  PLACENTA LOCALIZATION:  anterior, posterior GRADE 2  CERVIX: Unable to view  ADNEXA: The adnexa appears normal.   GESTATIONAL AGE AND  BIOMETRICS:  Gestational criteria: Estimated Date of Delivery: 09/02/14 now at 366w4d BIOPHYSCIAL PROFILE:                                                                                                       COMMENTS GROSS BODY MOVEMENT                 2   TONE                2   RESPIRATIONS  2   AMNIOTIC FLUID                2   NST                                                       0                                                          SCORE:  8/10 (Note: NST was performed as part of this antepartum testing)     SUSPECTED ABNORMALITIES:  no  QUALITY OF SCAN: satisfactory  TECHNICIAN COMMENTS:  U/S-vtx active fetus BPP 8/10, fluid WNL AFI-12.4cm SDP-4.5cm, FHR-148 bpm   A copy of this report including all images has been saved and backed up to  a second source for retrieval if  needed. All measures and details of the  anatomical scan, placentation, fluid volume and pelvic anatomy are  contained in that report.  Lazarus Gowda 07/26/2014 12:30 PM        Assessment/Plan: Penny [name] is  28 y.o. G3P1011 at 9w5dpresents with multiple complaints.   Watery diarrhea is concerning, asked RN to obtain an FOBT to ensure this is accurate. Also requested a U/A to look for infection, RBCs, or protein (given patient previously had elevated BPs the day prior in the MAU. Dr. EElonda Huskyevaluated the pt the previous day and felt she may be overmedicating with Zanaflex. He started her on Ambien for sleep.  Updated by RN: Unable to obtain FOBT (no stool card at this hospital). Additionally, pt upset that we've done nothing for her pain and left AMA.  DArchie Patten8/22/201510:11 AM   Patient not evaluated as she left AMA

## 2014-07-27 NOTE — H&P (Signed)
Penny [name] is  28 y.o. G3P1011 at [redacted]w[redacted]d presents complaining of Abdominal Pain back pain, dizziness, headache, spots in her vision, and diarrhea.  The diarrhea seems the least of her concerns currently.. Mostly concerned about pain with contractions which come often per the patient. She's very upset and frustrated by this pregnancy.   Her back pain is intolerable and she is unable to do much. She tells me Dr. Elonda Husky placed her on bed rest. He also took away her Zanaflex which she felt made things even worse. Her son "has been in bed with her all summer" as she cannot get up and play with him  Her headache is frontal/lateral and throbbing in nature x 2 weeks. She's also had "silver specs" in her vision for these 2 weeks. . She felt like this could be due to her anemia as her Hgb dropped from 12 to 10.   She is unsure if she's had LOF because she feels she may just have urinary incontinence x approximately 5 days. No vaginal bleeding or discharge. Good fetal movement. Contractions every few minutes.   She is on labetalol $RemoveBefo'100mg'vBrGASZIvsU$  BID currently.   No RUQ pain. Patient states she drinks a lot of water, however does not urinate too much, this is somewhat her baseline.   Best EDD 09/02/14 by [redacted]w[redacted]d U/S. LMP 1w off.   07/26/14: U/S-vtx active fetus BPP 8/10, fluid WNL AFI-12.4cm SDP-4.5cm, FHR-148 bpm   Prenatal History/Complications: gestation HTN  Past Medical History: Past Medical History  Diagnosis Date  . Migraines   . Interstitial cystitis   . IUD FEB 2010  . Gastritis JULY 2011  . Depression   . 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 Fatty liver   . Obesity (BMI 30-39.9) 2011 228 LBS BMI 36.8  . Iron deficiency anemia 07/23/2010  . Irritable bowel syndrome 2012 DIARRHEA    JUN 2012 TTG IgA 14.9  . Elevated liver enzymes JUL 2011ALK PHOS 111-127 AST  143-267 ALT  213-321T BILI 0.6  ALB  3.7-4.06 Jun 2011 ALK PHOS 118 AST 24 ALT 42 T BILI 0.4 ALB 3.9  . Chronic daily headache   .  Ovarian cyst   . PONV (postoperative nausea and vomiting)   . ADD (attention deficit disorder)   . RLQ abdominal pain 07/18/2013  . Menorrhagia 07/18/2013  . Anxiety   . Potassium (K) deficiency   . Dysmenorrhea 09/18/2013  . Stress 09/18/2013  . Patient desires pregnancy 09/18/2013  . Pregnant 12/25/2013  . Blood transfusion without reported diagnosis   . Heart murmur   . Gastric bypass status for obesity     Past Surgical History: Past Surgical History  Procedure Laterality Date  . Gab  2007    in [place]-POUCH 5 CM  . Cholecystectomy  2005    biliary dyskinesia  . Cath self every nite      for sodium bicarb injection (discontinued 2013)  . Colonoscopy  JUN 2012 ABD PN/DIARRHEA WITH PROPOFOL    NL COLON  . Upper gastrointestinal endoscopy  JULY 2011 NAUSEA-D125,V6, PH 25    Bx; GASTRITIS, POUCH-5 CM LONG  . Dilation and curettage of uterus    . Tonsillectomy    . Gastric bypass  06/2006  . Savory dilation  06/20/2012    TZG:YFVC gastritis/Ulcer in the mid jejunum  . Tonsillectomy and adenoidectomy    . Esophagogastroduodenoscopy    . Wisdom tooth extraction  Obstetrical History: OB History   Grav Para Term Preterm Abortions TAB SAB Ect Mult Living   '3 1 1  1  1   1      '$ Social History: History   Social History  . Marital Status: Married    Spouse Name: N/A    Number of Children: 1  . Years of Education: N/A   Occupational History  .     Social History Main Topics  . Smoking status: Former Smoker -- 5 years    Types: Cigarettes    Quit date: 01/07/2014  . Smokeless tobacco: Never Used  . Alcohol Use: No  . Drug Use: No  . Sexual Activity: Not Currently    Birth Control/ Protection: None     Comment: pregnancy   Other Topics Concern  . None   Social History Narrative  . None    Family History: Family History  Problem Relation Age of Onset  . Hemochromatosis Maternal Grandmother   . Migraines Maternal Grandmother   . Cancer Maternal  Grandmother   . Hypertension Father   . Diabetes Father   . Coronary artery disease Father   . Migraines Paternal Grandmother   . Cancer Mother     breast  . Hemochromatosis Mother   . Coronary artery disease Paternal Grandfather     Allergies: Allergies  Allergen Reactions  . Gabapentin Other (See Comments)    Pt states that she was unresponsive after taking this medication, but her vitals remained stable.    . Metoclopramide Hcl Anxiety and Other (See Comments)    Pt states that she felt like she was trapped in a box, and could not get out.  Pt also states that she had temporary loss of movement, weakness, and tingling.    . Topamax [Topiramate] Shortness Of Breath and Other (See Comments)    Adverse side effects.  Pt states this medication changed her taste for a short period of time and caused numbness and tingling in extremities.    . Codeine Nausea And Vomiting  . Latex Rash  . Tape Itching and Rash    Prescriptions prior to admission  Medication Sig Dispense Refill  . acetaminophen (TYLENOL) 500 MG tablet Take 1,000 mg by mouth every 6 (six) hours as needed for pain.      . Ascorbic Acid (VITAMIN C) 100 MG CHEW Chew 2 tablets by mouth daily.      Marland Kitchen BIOTIN PO Take 1 tablet by mouth daily.       . butalbital-acetaminophen-caffeine (FIORICET, ESGIC) 50-325-40 MG per tablet Take 1-2 tablets by mouth every 6 (six) hours as needed for headache.      . calcium carbonate (TUMS - DOSED IN MG ELEMENTAL CALCIUM) 500 MG chewable tablet Chew 3 tablets by mouth daily as needed for indigestion or heartburn.      . Cholecalciferol (VITAMIN D3) 2000 UNITS TABS Take 1 tablet by mouth daily.      . clonazePAM (KLONOPIN) 0.5 MG tablet Take 1 tablet (0.5 mg total) by mouth 2 (two) times daily as needed for anxiety.  30 tablet  1  . escitalopram (LEXAPRO) 20 MG tablet Take 1 tablet (20 mg total) by mouth daily.  30 tablet  11  . labetalol (NORMODYNE) 100 MG tablet Take 1 tablet (100 mg total) by  mouth 2 (two) times daily.  60 tablet  11  . Multiple Vitamins-Minerals (ZINC PO) Take 1 tablet by mouth daily.       Marland Kitchen nystatin (  MYCOSTATIN) 100000 UNIT/ML suspension TAKE 5 MLS BY MOUTH FOUR TIMES DAILY.  140 mL  3  . Prenatal Vit-Fe Fumarate-FA (PRENATAL MULTIVITAMIN) TABS Take 1 tablet by mouth at bedtime.       . promethazine (PHENERGAN) 25 MG tablet TAKE ONE TABLET BY MOUTH EVERY 6 HOURS AS NEEDED FOR NAUSEA.  22 tablet  1  . zolpidem (AMBIEN) 10 MG tablet Take 1 tablet (10 mg total) by mouth at bedtime as needed for sleep.  15 tablet  3     Review of Systems   Constitutional: No fever or chills. Positive fatigue  Blood pressure 166/104, pulse 98, temperature 98.3 F (36.8 C), temperature source Oral, resp. rate 18, last menstrual period 11/19/2013. General appearance: moderate distress initially, however after calming her down, she appears more relaxed (BPs continued to be elevated to 170s/110s).  Lungs: clear to auscultation bilaterally Heart: regular rate and rhythm Abdomen: soft, non-tender; bowel sounds normal Pelvic: 1.5/50/-3 Extremities: Homans sign is negative, no sign of DVT Neuro: DTR's +2, Normal gait, no gross focal deficits Presentation: unsure Fetal monitoringBaseline: 150 bpm, Variability: Good {> 6 bpm), Accelerations: Reactive and Decelerations: Absent Uterine activity- Irritability   Dilation: 1.5 Effacement (%): 50 Exam by:: Dr. Deniece Ree    Prenatal labs: ABO, Rh: --/--/A POS, A POS (02/15 1830) Antibody: NEG (06/17 0928) Rubella:  Immune RPR: NON REAC (06/17 4967)  HBsAg: NEGATIVE (03/16 1248)  HIV: NONREACTIVE (06/17 0928)  GBS:   Unknown  2hr GTT: 73/120/54    Prenatal Transfer Tool  Maternal Diabetes: No Genetic Screening: Normal Maternal Ultrasounds/Referrals: Normal Fetal Ultrasounds or other Referrals:  None Maternal Substance Abuse:  No However remote h/o opiate abuse Significant Maternal Medications:  Meds include: Other:  Klonopin,  Lexapro, ambien, phenergan Significant Maternal Lab Results: Unknown GBS    Results for orders placed during the hospital encounter of 07/27/14 (from the past 24 hour(s))  CBC   Collection Time    07/27/14  6:20 PM      Result Value Ref Range   WBC 9.0  4.0 - 10.5 K/uL   RBC 3.45 (*) 3.87 - 5.11 MIL/uL   Hemoglobin 10.5 (*) 12.0 - 15.0 g/dL   HCT 32.1 (*) 36.0 - 46.0 %   MCV 93.0  78.0 - 100.0 fL   MCH 30.4  26.0 - 34.0 pg   MCHC 32.7  30.0 - 36.0 g/dL   RDW 12.6  11.5 - 15.5 %   Platelets 224  150 - 400 K/uL  COMPREHENSIVE METABOLIC PANEL   Collection Time    07/27/14  6:20 PM      Result Value Ref Range   Sodium 140  137 - 147 mEq/L   Potassium 4.1  3.7 - 5.3 mEq/L   Chloride 109  96 - 112 mEq/L   CO2 18 (*) 19 - 32 mEq/L   Glucose, Bld 76  70 - 99 mg/dL   BUN 14  6 - 23 mg/dL   Creatinine, Ser 0.64  0.50 - 1.10 mg/dL   Calcium 8.4  8.4 - 10.5 mg/dL   Total Protein 5.6 (*) 6.0 - 8.3 g/dL   Albumin 2.7 (*) 3.5 - 5.2 g/dL   AST 16  0 - 37 U/L   ALT 10  0 - 35 U/L   Alkaline Phosphatase 159 (*) 39 - 117 U/L   Total Bilirubin 0.5  0.3 - 1.2 mg/dL   GFR calc non Af Amer >90  >90 mL/min   GFR calc Af  Amer >90  >90 mL/min   Anion gap 13  5 - 15  URINALYSIS, ROUTINE W REFLEX MICROSCOPIC   Collection Time    07/27/14  6:35 PM      Result Value Ref Range   Color, Urine YELLOW  YELLOW   APPearance CLEAR  CLEAR   Specific Gravity, Urine 1.025  1.005 - 1.030   pH 6.0  5.0 - 8.0   Glucose, UA NEGATIVE  NEGATIVE mg/dL   Hgb urine dipstick NEGATIVE  NEGATIVE   Bilirubin Urine NEGATIVE  NEGATIVE   Ketones, ur 40 (*) NEGATIVE mg/dL   Protein, ur 100 (*) NEGATIVE mg/dL   Urobilinogen, UA 1.0  0.0 - 1.0 mg/dL   Nitrite NEGATIVE  NEGATIVE   Leukocytes, UA NEGATIVE  NEGATIVE  PROTEIN / CREATININE RATIO, URINE   Collection Time    07/27/14  6:35 PM      Result Value Ref Range   Creatinine, Urine 328.66     Total Protein, Urine 108.9     PROTEIN CREATININE RATIO 0.33 (*) 0.00  - 0.15  URINE MICROSCOPIC-ADD ON   Collection Time    07/27/14  6:35 PM      Result Value Ref Range   Squamous Epithelial / LPF FEW (*) RARE   WBC, UA 0-2  <3 WBC/hpf   RBC / HPF 0-2  <3 RBC/hpf   Urine-Other MUCOUS PRESENT      Assessment: Penny [name] is a 28 y.o. G3P1011 at [redacted]w[redacted]d by [redacted]w[redacted]d U/S presenting for multiple complaints including HA and scotomata found to have severe range BPs to the 180s/119. Urine protein/creatinine noted to be 0.33. Previous BPP from 8/21 reassuring. Patient's presentation concerning for pre-eclampsia.   - Will observe overnight - 24hr urine protein/creatinine  - Head CT to evaluate for PRES and r/o intracranial mass causing HA/vision abnormalities - GBS in progress  - Magnesium  - Increased labetalol to $RemoveBefo'400mg'DJUEnSnBBvl$  BID - Hydralazine PRN HTN  - Repeat neuro checks    Archie Patten 07/27/2014, 7:29 PM  OB fellow attestation:  I have seen and examined this patient; I agree with above documentation in the resident's note.   Penny [name] is a 28 y.o. G3P1011 reporting contractions, then left sided weakness.  Reports headaches, dizziness for past 2 weeks. +FM, denies LOF, VB, vaginal discharge.  PE: BP 158/95  Pulse 97  Temp(Src) 98.3 F (36.8 C) (Oral)  Resp 18  SpO2 97%  LMP 11/19/2013 Gen: crying with contractions, then became very flat when complained of left sided weakness, laying with head turned right and mouth open Resp: normal effort, no distress Abd: gravid Neuro: CN II-XII intact with exception of decreased sensation to left face, strength 4/5 right, 3/5 left however when attempted to do babinski patient clearly extending legs and relaxed when asked to do so.  Left upper extremity strength was 1/5 initially then 3/5, right upper extremity also initially 1/5 but normal during other parts of exam.  DTR 2/4, no hyperreflexia  ROS, labs, PMH reviewed NST reactive  Plan: - blood pressures elevated, will start magnesium given  neurologic symptoms and severe range blood pressures.  24h urine collection started. - head CT to eval for PRES - labetalol $RemoveBef'400mg'qdJxXKQmIR$  BID, prn hydralazine/labetalol - NST reactive, BPP yesterday (for nrNST) 8/8 - will be reevaluated by resident on call for further changes in neuro exam. - advised that will receive minimal pain medicine/sedating medications to not change neuro exam Merla Riches, MD 8:58 PM

## 2014-07-27 NOTE — Progress Notes (Signed)
Notified of pt complaining of left side pain. Will come evaluate pt

## 2014-07-27 NOTE — MAU Note (Signed)
Pt states here with diarrhea since yesterday. Having back spasms as well.

## 2014-07-27 NOTE — MAU Note (Signed)
States back pain and headache for weeks. Notes mucus discharge, and has had some leaking of fluid, unsure if urine. Leaking began 2-3 days ago.

## 2014-07-27 NOTE — MAU Note (Signed)
Per pt's mother, pt has been having intermittent pain every 1-2 minutes for the past two hours. Diarrhea since yesterday.

## 2014-07-27 NOTE — MAU Note (Signed)
Dr. Deniece Ree and Dr. Lorenso Courier on unit, notified of pt's return after leaving earlier [facility]. Will go see pt.

## 2014-07-27 NOTE — Progress Notes (Addendum)
Pt's husband took BP without the nurse in the room and without consent of the nurse.  Charge nurse notified.

## 2014-07-28 DIAGNOSIS — O139 Gestational [pregnancy-induced] hypertension without significant proteinuria, unspecified trimester: Secondary | ICD-10-CM

## 2014-07-28 LAB — OB RESULTS CONSOLE GBS: STREP GROUP B AG: POSITIVE

## 2014-07-28 LAB — GROUP B STREP BY PCR: Group B strep by PCR: POSITIVE — AB

## 2014-07-28 MED ORDER — ONDANSETRON HCL 4 MG/2ML IJ SOLN
4.0000 mg | Freq: Four times a day (QID) | INTRAMUSCULAR | Status: DC | PRN
Start: 2014-07-28 — End: 2014-07-30

## 2014-07-28 MED ORDER — DIPHENHYDRAMINE HCL 50 MG/ML IJ SOLN
12.5000 mg | Freq: Four times a day (QID) | INTRAMUSCULAR | Status: DC | PRN
  Administered 2014-07-28 – 2014-07-29 (×2): 12.5 mg via INTRAVENOUS
  Filled 2014-07-28: qty 1

## 2014-07-28 MED ORDER — MISOPROSTOL 25 MCG QUARTER TABLET
25.0000 ug | ORAL_TABLET | ORAL | Status: DC | PRN
  Administered 2014-07-28 – 2014-07-29 (×3): 25 ug via VAGINAL
  Filled 2014-07-28 (×3): qty 0.25

## 2014-07-28 MED ORDER — LIDOCAINE HCL (PF) 1 % IJ SOLN
30.0000 mL | INTRAMUSCULAR | Status: AC | PRN
  Administered 2014-07-29: 10 mL via SUBCUTANEOUS
  Administered 2014-07-29: 30 mL via SUBCUTANEOUS

## 2014-07-28 MED ORDER — LACTATED RINGERS IV SOLN
INTRAVENOUS | Status: DC
  Administered 2014-07-28 – 2014-07-29 (×2): via INTRAVENOUS

## 2014-07-28 MED ORDER — HYDROMORPHONE HCL PF 1 MG/ML IJ SOLN
1.0000 mg | INTRAMUSCULAR | Status: DC | PRN
  Administered 2014-07-28 – 2014-07-30 (×4): 1 mg via INTRAVENOUS
  Filled 2014-07-28 (×5): qty 1

## 2014-07-28 MED ORDER — TERBUTALINE SULFATE 1 MG/ML IJ SOLN: 0.2500 mg | Freq: Once | INTRAMUSCULAR | Status: AC | PRN

## 2014-07-28 MED ORDER — OXYTOCIN 40 UNITS IN LACTATED RINGERS INFUSION - SIMPLE MED: 62.5000 mL/h | INTRAVENOUS | Status: DC

## 2014-07-28 MED ORDER — FLEET ENEMA 7-19 GM/118ML RE ENEM: 1.0000 | ENEMA | RECTAL | Status: DC | PRN

## 2014-07-28 MED ORDER — ACETAMINOPHEN 500 MG PO TABS
1000.0000 mg | ORAL_TABLET | Freq: Four times a day (QID) | ORAL | Status: DC | PRN
  Administered 2014-07-28: 1000 mg via ORAL
  Filled 2014-07-28 (×2): qty 2

## 2014-07-28 MED ORDER — OXYTOCIN BOLUS FROM INFUSION: 500.0000 mL | INTRAVENOUS | Status: DC

## 2014-07-28 MED ORDER — LABETALOL HCL 200 MG PO TABS
400.0000 mg | ORAL_TABLET | Freq: Three times a day (TID) | ORAL | Status: DC
  Administered 2014-07-28 – 2014-07-29 (×6): 400 mg via ORAL
  Filled 2014-07-28: qty 2
  Filled 2014-07-28 (×5): qty 1
  Filled 2014-07-28 (×2): qty 2
  Filled 2014-07-28 (×2): qty 1

## 2014-07-28 MED ORDER — FENTANYL CITRATE 0.05 MG/ML IJ SOLN
100.0000 ug | Freq: Once | INTRAMUSCULAR | Status: AC
  Administered 2014-07-28: 100 ug via INTRAVENOUS
  Filled 2014-07-28: qty 2

## 2014-07-28 MED ORDER — NALBUPHINE HCL 10 MG/ML IJ SOLN
10.0000 mg | INTRAMUSCULAR | Status: DC | PRN
Start: 2014-07-28 — End: 2014-07-30
  Administered 2014-07-29 (×3): 10 mg via INTRAVENOUS
  Filled 2014-07-28 (×4): qty 1

## 2014-07-28 MED ORDER — LACTATED RINGERS IV SOLN
500.0000 mL | INTRAVENOUS | Status: DC | PRN
  Administered 2014-07-29: 500 mL via INTRAVENOUS

## 2014-07-28 MED ORDER — OXYCODONE-ACETAMINOPHEN 5-325 MG PO TABS: 1.0000 | ORAL_TABLET | ORAL | Status: DC | PRN

## 2014-07-28 MED ORDER — LACTATED RINGERS IV SOLN
INTRAVENOUS | Status: DC
  Administered 2014-07-28 – 2014-07-30 (×3): via INTRAVENOUS

## 2014-07-28 MED ORDER — KETOROLAC TROMETHAMINE 30 MG/ML IJ SOLN
30.0000 mg | Freq: Once | INTRAMUSCULAR | Status: AC
  Administered 2014-07-28: 30 mg via INTRAVENOUS
  Filled 2014-07-28 (×2): qty 1

## 2014-07-28 MED ORDER — CITRIC ACID-SODIUM CITRATE 334-500 MG/5ML PO SOLN
30.0000 mL | ORAL | Status: DC | PRN
  Administered 2014-07-29: 30 mL via ORAL
  Filled 2014-07-28: qty 15

## 2014-07-28 MED ORDER — IBUPROFEN 600 MG PO TABS: 600.0000 mg | ORAL_TABLET | Freq: Four times a day (QID) | ORAL | Status: DC | PRN

## 2014-07-28 MED ORDER — ACETAMINOPHEN 325 MG PO TABS: 650.0000 mg | ORAL_TABLET | ORAL | Status: DC | PRN

## 2014-07-28 MED ORDER — PROMETHAZINE HCL 25 MG/ML IJ SOLN
25.0000 mg | INTRAMUSCULAR | Status: DC | PRN
  Administered 2014-07-28 – 2014-07-30 (×6): 25 mg via INTRAVENOUS
  Filled 2014-07-28 (×7): qty 1

## 2014-07-28 NOTE — Progress Notes (Signed)
Pt calling her mother on the phone and crying and upset that she was not getting both medications at the same time.  Pt informed that after one hour that we would re-evaluate to give the Dilaudid.

## 2014-07-28 NOTE — Progress Notes (Signed)
Patient ID: Penny [name], female   DOB: 13-Apr-1986, 28 y.o.   MRN: [id number] Filed Vitals:   07/28/14 1042 07/28/14 1055 07/28/14 1200 07/28/14 1257  BP: 134/90   136/91  Pulse: 91   88  Temp:    98.1 F (36.7 C)  TempSrc:    Oral  Resp: 18 20 18 18   Height:      Weight:      SpO2:        I have reviewed the current presentation and clinical status and have concluded the patient has had progression of her hypertensive disorder from gestational hypertension to pre eclampsia with severe features.  The patient had been managed on Labetalol 100 mg BID for a few weeks but now has BP that has required a 6 fold acute increase in dosage to maintain and manage her BP.  Additionally she has had an acute change in the quality and intensity and duration of her headache.  Ct scan last night was negative but she continues to have visual changes in association with a left sided, vascular spasm, migrainoid type headache.  Although she is a headache sufferer this headache is different as described above  I talked with Dr Burnett Harry extensively  regarding  the patient's somewhat difficult to interpret findings and she agrees that with the steep inflection of her BP coupled with the significant CNS symptoms this most likely represnts severe features of her hypertensive pregnancy disorder.  I talke with the patient family and friend for probably 30 minutes earlier and they all understand and all questions were answered.  They understand the clinical features and with the decision to proceed with ripening and then induction

## 2014-07-28 NOTE — Progress Notes (Signed)
Patient ID: Penny [name], female   DOB: Mar 08, 1986, 28 y.o.   MRN: [id number]  S. Still has a headache but the contractions have decreased. Good FM. Denies ROM or contractions.  O. VSS, AF  Her BPs are still in the 150s/80s range      DTRs-2+      LE- +2 edema, negative Homan's      Heart- rrr      Lungs- CTAB      Abd- benign, gravid      FHR- category 1  A/P. [redacted] weeks EGA with PIH/r/o pre eclampsia    Continue magnesium, 24 hour urine

## 2014-07-28 NOTE — Progress Notes (Signed)
Patient called out asking for benadryl; RN to room to assess need for benadryl and patient states she is "very itchy" from the fentanyl; patient also asking if she can eat something at this time before next cytotec; called Dr Deniece Ree for orders and patient allowed to eat tonight and benadryl 12.5mg  iv order given

## 2014-07-28 NOTE — Progress Notes (Signed)
Patient c/o IV site bothering her at this time; in report from day shift they stated they tried multiple times to access a site and were unsuccessful. Call put in to tonight's CRNA and she stated she would come up and take a look for a new site.

## 2014-07-28 NOTE — Progress Notes (Signed)
Patient ID: Penny [name], female   DOB: [birth year], 28 y.o.   MRN: [id number]  Doing well.  FHR reassuring Irregular contractions  Dilation: 1.5 Effacement (%): 50 Cervical Position: Posterior Station: -3;-2 Presentation: Vertex Exam by:: Haywood Pao RNC [id number]  Discussed ripening with Cytotec vs Foley + Pitocin  Patient wants to continue with Cytotec for now Will medicate for pain/sleep  Consider Foley later, may need early epidural

## 2014-07-28 NOTE — Progress Notes (Signed)
Pt off the monitor.

## 2014-07-28 NOTE — Progress Notes (Signed)
Spoke with MD about her NST this morning and the complaints of h/a and nausea.  Per MD, give the Phenergan prn med now and re-evaluate in one hour before giving the Dilaudid.

## 2014-07-29 ENCOUNTER — Encounter (HOSPITAL_COMMUNITY): Payer: Self-pay | Admitting: *Deleted

## 2014-07-29 ENCOUNTER — Other Ambulatory Visit: Payer: Medicaid Other

## 2014-07-29 ENCOUNTER — Encounter: Payer: Medicaid Other | Admitting: Obstetrics & Gynecology

## 2014-07-29 ENCOUNTER — Encounter (HOSPITAL_COMMUNITY): Payer: Medicaid Other | Admitting: Anesthesiology

## 2014-07-29 ENCOUNTER — Inpatient Hospital Stay (HOSPITAL_COMMUNITY): Payer: Medicaid Other | Admitting: Anesthesiology

## 2014-07-29 DIAGNOSIS — O99844 Bariatric surgery status complicating childbirth: Secondary | ICD-10-CM

## 2014-07-29 DIAGNOSIS — O1414 Severe pre-eclampsia complicating childbirth: Secondary | ICD-10-CM

## 2014-07-29 LAB — CBC
HCT: 31.1 % — ABNORMAL LOW (ref 36.0–46.0)
HEMOGLOBIN: 10.1 g/dL — AB (ref 12.0–15.0)
MCH: 30.8 pg (ref 26.0–34.0)
MCHC: 32.5 g/dL (ref 30.0–36.0)
MCV: 94.8 fL (ref 78.0–100.0)
PLATELETS: 171 10*3/uL (ref 150–400)
RBC: 3.28 MIL/uL — AB (ref 3.87–5.11)
RDW: 12.6 % (ref 11.5–15.5)
WBC: 8.4 10*3/uL (ref 4.0–10.5)

## 2014-07-29 LAB — CREATININE CLEARANCE, URINE, 24 HOUR
CREAT CLEAR: 135 mL/min — AB (ref 75–115)
Collection Interval-CRCL: 24 hours
Creatinine, 24H Ur: 1241 mg/d (ref 700–1800)
Creatinine, Urine: 45.98 mg/dL
Creatinine: 0.64 mg/dL (ref 0.50–1.10)
Urine Total Volume-CRCL: 2700 mL

## 2014-07-29 LAB — PROTEIN, URINE, 24 HOUR
Collection Interval-UPROT: 24 hours
PROTEIN, URINE: 11 mg/dL (ref 5–24)
Protein, 24H Urine: 297 mg/d — ABNORMAL HIGH (ref <150)
Urine Total Volume-UPROT: 2700 mL

## 2014-07-29 MED ORDER — MAGNESIUM SULFATE 40 G IN LACTATED RINGERS - SIMPLE
2.0000 g/h | INTRAVENOUS | Status: DC
  Administered 2014-07-29: 2 g/h via INTRAVENOUS
  Filled 2014-07-29: qty 500

## 2014-07-29 MED ORDER — FENTANYL 2.5 MCG/ML BUPIVACAINE 1/10 % EPIDURAL INFUSION (WH - ANES)
INTRAMUSCULAR | Status: AC
  Administered 2014-07-29: 14 mL/h via EPIDURAL
  Filled 2014-07-29: qty 125

## 2014-07-29 MED ORDER — PHENYLEPHRINE 40 MCG/ML (10ML) SYRINGE FOR IV PUSH (FOR BLOOD PRESSURE SUPPORT): 80.0000 ug | PREFILLED_SYRINGE | INTRAVENOUS | Status: DC | PRN

## 2014-07-29 MED ORDER — DIPHENHYDRAMINE HCL 50 MG/ML IJ SOLN
12.5000 mg | INTRAMUSCULAR | Status: DC | PRN
  Filled 2014-07-29: qty 1

## 2014-07-29 MED ORDER — LIDOCAINE HCL (PF) 1 % IJ SOLN
INTRAMUSCULAR | Status: AC
  Administered 2014-07-29: 30 mL via SUBCUTANEOUS
  Filled 2014-07-29: qty 30

## 2014-07-29 MED ORDER — PENICILLIN G POTASSIUM 5000000 UNITS IJ SOLR
5.0000 10*6.[IU] | Freq: Once | INTRAVENOUS | Status: AC
  Administered 2014-07-29: 5 10*6.[IU] via INTRAVENOUS
  Filled 2014-07-29: qty 5

## 2014-07-29 MED ORDER — FENTANYL CITRATE 0.05 MG/ML IJ SOLN
100.0000 ug | INTRAMUSCULAR | Status: DC | PRN
  Administered 2014-07-29 (×3): 100 ug via INTRAVENOUS
  Filled 2014-07-29 (×3): qty 2

## 2014-07-29 MED ORDER — TERBUTALINE SULFATE 1 MG/ML IJ SOLN: 0.2500 mg | Freq: Once | INTRAMUSCULAR | Status: AC | PRN

## 2014-07-29 MED ORDER — SODIUM BICARBONATE 8.4 % IV SOLN
INTRAVENOUS | Status: DC | PRN
  Administered 2014-07-29 – 2014-07-30 (×3): 5 mL via EPIDURAL

## 2014-07-29 MED ORDER — CARBOPROST TROMETHAMINE 250 MCG/ML IM SOLN
INTRAMUSCULAR | Status: DC
Start: 2014-07-29 — End: 2014-07-30
  Filled 2014-07-29: qty 1

## 2014-07-29 MED ORDER — NALBUPHINE HCL 10 MG/ML IJ SOLN
10.0000 mg | Freq: Once | INTRAMUSCULAR | Status: AC
Start: 2014-07-29 — End: 2014-07-29
  Administered 2014-07-29: 10 mg via INTRAVENOUS

## 2014-07-29 MED ORDER — FENTANYL 2.5 MCG/ML BUPIVACAINE 1/10 % EPIDURAL INFUSION (WH - ANES)
INTRAMUSCULAR | Status: DC | PRN
  Administered 2014-07-29: 14 mL/h via EPIDURAL

## 2014-07-29 MED ORDER — FENTANYL 2.5 MCG/ML BUPIVACAINE 1/10 % EPIDURAL INFUSION (WH - ANES)
14.0000 mL/h | INTRAMUSCULAR | Status: DC | PRN
  Administered 2014-07-29 (×2): 14 mL/h via EPIDURAL
  Filled 2014-07-29: qty 125

## 2014-07-29 MED ORDER — PENICILLIN G POTASSIUM 5000000 UNITS IJ SOLR
2.5000 10*6.[IU] | INTRAVENOUS | Status: DC
  Administered 2014-07-29 (×2): 2.5 10*6.[IU] via INTRAVENOUS
  Filled 2014-07-29 (×6): qty 2.5

## 2014-07-29 MED ORDER — MISOPROSTOL 200 MCG PO TABS: 1000.0000 ug | ORAL_TABLET | Freq: Once | ORAL | Status: DC

## 2014-07-29 MED ORDER — LACTATED RINGERS IV SOLN: 500.0000 mL | Freq: Once | INTRAVENOUS | Status: DC

## 2014-07-29 MED ORDER — EPHEDRINE 5 MG/ML INJ: 10.0000 mg | INTRAVENOUS | Status: DC | PRN

## 2014-07-29 MED ORDER — PHENYLEPHRINE 40 MCG/ML (10ML) SYRINGE FOR IV PUSH (FOR BLOOD PRESSURE SUPPORT)
PREFILLED_SYRINGE | INTRAVENOUS | Status: AC
  Filled 2014-07-29: qty 10

## 2014-07-29 MED ORDER — LIDOCAINE HCL (PF) 1 % IJ SOLN: 30.0000 mL | Freq: Once | INTRAMUSCULAR | Status: DC

## 2014-07-29 MED ORDER — NALOXONE HCL 0.4 MG/ML IJ SOLN
INTRAMUSCULAR | Status: AC
  Filled 2014-07-29: qty 1

## 2014-07-29 MED ORDER — MISOPROSTOL 200 MCG PO TABS
ORAL_TABLET | ORAL | Status: AC
  Filled 2014-07-29: qty 5

## 2014-07-29 MED ORDER — OXYTOCIN 40 UNITS IN LACTATED RINGERS INFUSION - SIMPLE MED
1.0000 m[IU]/min | INTRAVENOUS | Status: DC
Start: 2014-07-29 — End: 2014-07-30
  Administered 2014-07-29: 2 m[IU]/min via INTRAVENOUS
  Filled 2014-07-29: qty 1000

## 2014-07-29 MED ORDER — FENTANYL 2.5 MCG/ML BUPIVACAINE 1/10 % EPIDURAL INFUSION (WH - ANES): 14.0000 mL/h | INTRAMUSCULAR | Status: DC | PRN

## 2014-07-29 NOTE — Progress Notes (Signed)
Penny [name] is a 28 y.o. G3P1011 at [redacted]w[redacted]d by ultrasound admitted for induction of labor due to severe pre eclampsia.  Subjective:   Objective: BP 124/80  Pulse 84  Temp(Src) 98.1 F (36.7 C) (Oral)  Resp 20  Ht 5\' 5"  (1.651 m)  Wt 236 lb (107.049 kg)  BMI 39.27 kg/m2  SpO2 97%  LMP 11/19/2013 I/O last 3 completed shifts: In: 7058.3 [P.O.:3300; I.V.:3758.3] Out: 3950 [Urine:3950] Total I/O In: 1466.7 [P.O.:600; I.V.:866.7] Out: 750 [Urine:750]  FHT:  Reassuring some reactivity UC:   irregular, every 4 minutes SVE:   Dilation: 2.5 Effacement (%): 50 Station: -3 Exam by:: Dr. Elonda Husky Foley placed for cervicl ripening Labs: Lab Results  Component Value Date   WBC 8.4 07/29/2014   HGB 10.1* 07/29/2014   HCT 31.1* 07/29/2014   MCV 94.8 07/29/2014   PLT 171 07/29/2014    Assessment / Plan: Induction of labor due to preeclampsia,  progressing well on pitocin  Labor: ripening Preeclampsia:  on magnesium sulfate Fetal Wellbeing:  Category I Pain Control:  Fentanyl I/D:   Anticipated MOD:  NSVD  Viren Lebeau H 07/29/2014, 2:28 PM

## 2014-07-29 NOTE — Anesthesia Preprocedure Evaluation (Addendum)
Anesthesia Evaluation  Patient identified by MRN, date of birth, ID band Patient awake    Reviewed: Allergy & Precautions, H&P , Patient's Chart, lab work & pertinent test results  Airway Mallampati: II TM Distance: >3 FB Neck ROM: full    Dental  (+) Teeth Intact   Pulmonary former smoker,  breath sounds clear to auscultation        Cardiovascular hypertension, On Medications Rhythm:regular Rate:Normal     Neuro/Psych    GI/Hepatic   Endo/Other  Morbid obesity  Renal/GU      Musculoskeletal   Abdominal   Peds  Hematology  (+) anemia ,   Anesthesia Other Findings       Reproductive/Obstetrics (+) Pregnancy                           Anesthesia Physical Anesthesia Plan  ASA: III  Anesthesia Plan: Epidural   Post-op Pain Management:    Induction:   Airway Management Planned:   Additional Equipment:   Intra-op Plan:   Post-operative Plan:   Informed Consent: I have reviewed the patients History and Physical, chart, labs and discussed the procedure including the risks, benefits and alternatives for the proposed anesthesia with the patient or authorized representative who has indicated his/her understanding and acceptance.   Dental Advisory Given  Plan Discussed with:   Anesthesia Plan Comments: (Labs checked- platelets confirmed with RN in room. Fetal heart tracing, per RN, reported to be stable enough for sitting procedure. Discussed epidural, and patient consents to the procedure:  included risk of possible headache,backache, failed block, allergic reaction, and nerve injury. This patient was asked if she had any questions or concerns before the procedure started.)        Anesthesia Quick Evaluation

## 2014-07-29 NOTE — Progress Notes (Signed)
LABOR PROGRESS NOTE  Penny [name] is a 28 y.o. G3P1011 at [redacted]w[redacted]d  admitted for induction of labor due to pre-eclamsia with severe features (headache, severe range blood pressures).  Subjective: Comfortable with epidural. Mild headache.  Objective: BP 139/93  Pulse 83  Temp(Src) 98.1 F (36.7 C) (Oral)  Resp 20  Ht 5\' 5"  (1.651 m)  Wt 107.049 kg (236 lb)  BMI 39.27 kg/m2  SpO2 99%  LMP 11/19/2013 or  Filed Vitals:   07/29/14 1514 07/29/14 1516 07/29/14 1519 07/29/14 1521  BP:  140/92  139/93  Pulse: 79 76 77 83  Temp:      TempSrc:      Resp:  20  20  Height:      Weight:      SpO2: 100%  99%     Total I/O In: 2095 [P.O.:600; I.V.:1495] Out: 750 [Urine:750]  FHT:  FHR: 125 bpm, variability: moderate,  accelerations:  Present,  decelerations:  Absent UC:   irregular, every 3-7 minutes SVE:   Dilation: 5 Effacement (%): 70;80 Station: -2 Exam by:: Dr. Angelica Chessman  Dilation: 5 Effacement (%): 70;80 Cervical Position: Posterior Station: -2 Presentation: Vertex Exam by:: Dr. Angelica Chessman   Labs: Lab Results  Component Value Date   WBC 8.4 07/29/2014   HGB 10.1* 07/29/2014   HCT 31.1* 07/29/2014   MCV 94.8 07/29/2014   PLT 171 07/29/2014    Assessment / Plan: Induction of labor due to preeclampsia. Progressing well.   Labor: Progressing well. Foley bulb out. Start pitocin and increase per protocol. Plan for AROM once 4 hours of pencillin.  Pre-Eclampsia with severe features: Continue magnesium sulfate for seizure ppx. Monitor for s/s toxicity. Fetal Wellbeing:  Category I Pain Control:  Epidural Anticipated MOD:  NSVD  Shelbie Hutching, MD 07/29/2014, 3:58 PM

## 2014-07-29 NOTE — Progress Notes (Signed)
LABOR PROGRESS NOTE  Penny [name] is a 28 y.o. G3P1011 at [redacted]w[redacted]d  admitted for induction of labor due to pre-eclampsia with severe features.  Subjective: Complaining of itching all over. Denies headache, visual changes, shortness of breath, or other concerning symptoms.  Objective: BP 143/92  Pulse 91  Temp(Src) 97.9 F (36.6 C) (Oral)  Resp 20  Ht 5\' 5"  (1.651 m)  Wt 107.049 kg (236 lb)  BMI 39.27 kg/m2  SpO2 99%  LMP 11/19/2013 or  Filed Vitals:   07/29/14 1930 07/29/14 2000 07/29/14 2031 07/29/14 2101  BP: 126/79 130/91 147/97 143/92  Pulse: 81 88 79 91  Temp:  97.9 F (36.6 C)    TempSrc:  Oral    Resp: 18 20 18 20   Height:      Weight:      SpO2:        Total I/O In: 305.6 [P.O.:60; I.V.:245.6] Out: 365 [Urine:365]  FHT:  FHR: 125 bpm, variability: moderate,  accelerations:  Present,  decelerations:  Present variable UC:   irregular, every 2-5 minutes SVE:   Dilation: 6 Effacement (%): 70;80 Station: -2 Exam by:: Dr Angelica Chessman   Dilation: 6 Effacement (%): 70;80 Cervical Position: Posterior Station: -2 Presentation: Vertex Exam by:: Dr Angelica Chessman   Pitocin @ 10 mu/min  Labs: Lab Results  Component Value Date   WBC 8.4 07/29/2014   HGB 10.1* 07/29/2014   HCT 31.1* 07/29/2014   MCV 94.8 07/29/2014   PLT 171 07/29/2014    Assessment / Plan: Induction of labor due to preeclampsia,  progressing well on pitocin  Labor: Progressing on Pitocin, will continue to increase per protocol. AROM, clear fluid.  Preeclampsia with severe features: Continue magnesium for seizure ppx and monitor for signs/symptoms of toxicity. Monitor blood pressures and urine output.  Fetal Wellbeing:  Category I Pain Control:  Epidural Anticipated MOD:  NSVD  Shelbie Hutching, MD 07/29/2014, 9:29 PM

## 2014-07-29 NOTE — Progress Notes (Signed)
Phlebotomy at beside for cbc. CRNA at bedside. Dr Elly Modena and Dr Angelica Chessman remain at bedside for repair. Family at bedside, updated.

## 2014-07-29 NOTE — Anesthesia Procedure Notes (Signed)
Epidural Patient location during procedure: OB  Preanesthetic Checklist Completed: patient identified, site marked, surgical consent, pre-op evaluation, timeout performed, IV checked, risks and benefits discussed and monitors and equipment checked  Epidural Patient position: sitting Prep: site prepped and draped and DuraPrep Patient monitoring: continuous pulse ox and blood pressure Approach: midline Injection technique: LOR air  Needle:  Needle type: Tuohy  Needle gauge: 17 G Needle length: 9 cm and 9 Needle insertion depth: 7 cm Catheter type: closed end flexible Catheter size: 19 Gauge Catheter at skin depth: 14 cm Test dose: negative  Assessment Events: blood not aspirated, injection not painful, no injection resistance, negative IV test and paresthesia  Additional Notes With insertion of 1st Epidural catheter, Patient had persistent paresthesia which cleared after a few minutes. Then I repeated the procedure exactly as above with same LOR.  Dosing of Epidural:  1st dose, through catheter ............................................Marland Kitchen  Xylocaine 40 mg  2nd dose, through catheter, after waiting 3 minutes........Marland KitchenXylocaine 60 mg    ( 1% Xylo charted as a single dose in Epic Meds for ease of charting; actual dosing was fractionated as above, for saftey's sake)  As each dose occurred, patient was free of IV sx; and patient exhibited no evidence of SA injection.  Patient is more comfortable after epidural dosed. Please see RN's note for documentation of vital signs,and FHR which are stable.  Patient reminded not to try to ambulate with numb legs, and that an RN must be present when she attempts to get up.

## 2014-07-30 ENCOUNTER — Encounter (HOSPITAL_COMMUNITY): Payer: Self-pay | Admitting: Obstetrics

## 2014-07-30 ENCOUNTER — Encounter (HOSPITAL_COMMUNITY)
Admission: AD | Disposition: A | Discharge status: Home / Self Care | Payer: Self-pay | Source: Ambulatory Visit | Attending: Obstetrics & Gynecology

## 2014-07-30 ENCOUNTER — Encounter (HOSPITAL_COMMUNITY): Payer: Medicaid Other

## 2014-07-30 ENCOUNTER — Ambulatory Visit (HOSPITAL_COMMUNITY)
Admit: 2014-07-30 | Discharge: 2014-07-30 | Disposition: A | Discharge status: Home / Self Care | Payer: Medicaid Other | Attending: Obstetrics & Gynecology | Admitting: Obstetrics & Gynecology

## 2014-07-30 ENCOUNTER — Inpatient Hospital Stay (HOSPITAL_COMMUNITY): Payer: Medicaid Other

## 2014-07-30 DIAGNOSIS — O1414 Severe pre-eclampsia complicating childbirth: Secondary | ICD-10-CM

## 2014-07-30 DIAGNOSIS — O99335 Smoking (tobacco) complicating the puerperium: Secondary | ICD-10-CM

## 2014-07-30 DIAGNOSIS — R209 Unspecified disturbances of skin sensation: Secondary | ICD-10-CM

## 2014-07-30 DIAGNOSIS — R29898 Other symptoms and signs involving the musculoskeletal system: Secondary | ICD-10-CM

## 2014-07-30 HISTORY — PX: REPAIR VAGINAL CUFF: SHX6067

## 2014-07-30 LAB — CBC
HCT: 20.7 % — ABNORMAL LOW (ref 36.0–46.0)
HCT: 27.1 % — ABNORMAL LOW (ref 36.0–46.0)
HEMOGLOBIN: 9.1 g/dL — AB (ref 12.0–15.0)
Hemoglobin: 6.9 g/dL — CL (ref 12.0–15.0)
MCH: 31.5 pg (ref 26.0–34.0)
MCH: 30.5 pg (ref 26.0–34.0)
MCHC: 33.3 g/dL (ref 30.0–36.0)
MCHC: 33.6 g/dL (ref 30.0–36.0)
MCV: 94.5 fL (ref 78.0–100.0)
MCV: 90.9 fL (ref 78.0–100.0)
Platelets: 136 10*3/uL — ABNORMAL LOW (ref 150–400)
Platelets: 147 10*3/uL — ABNORMAL LOW (ref 150–400)
RBC: 2.19 MIL/uL — ABNORMAL LOW (ref 3.87–5.11)
RBC: 2.98 MIL/uL — AB (ref 3.87–5.11)
RDW: 12.5 % (ref 11.5–15.5)
RDW: 14.5 % (ref 11.5–15.5)
WBC: 10.9 10*3/uL — AB (ref 4.0–10.5)
WBC: 10.6 10*3/uL — ABNORMAL HIGH (ref 4.0–10.5)

## 2014-07-30 LAB — MRSA PCR SCREENING: MRSA BY PCR: NEGATIVE

## 2014-07-30 LAB — PREPARE RBC (CROSSMATCH)

## 2014-07-30 SURGERY — REPAIR, VAGINAL CUFF
Anesthesia: Epidural | Site: Vagina

## 2014-07-30 MED ORDER — IBUPROFEN 600 MG PO TABS
600.0000 mg | ORAL_TABLET | Freq: Four times a day (QID) | ORAL | Status: DC
  Administered 2014-07-30 (×2): 600 mg via ORAL
  Filled 2014-07-30 (×2): qty 1

## 2014-07-30 MED ORDER — WITCH HAZEL-GLYCERIN EX PADS: 1.0000 "application " | MEDICATED_PAD | CUTANEOUS | Status: DC | PRN

## 2014-07-30 MED ORDER — PIPERACILLIN-TAZOBACTAM 3.375 G IVPB
3.3750 g | Freq: Three times a day (TID) | INTRAVENOUS | Status: DC
Start: 2014-07-30 — End: 2014-07-30
  Filled 2014-07-30 (×4): qty 50

## 2014-07-30 MED ORDER — MAGNESIUM SULFATE 40 G IN LACTATED RINGERS - SIMPLE
2.0000 g/h | INTRAVENOUS | Status: AC
  Administered 2014-07-30: 2 g/h via INTRAVENOUS
  Filled 2014-07-30: qty 500

## 2014-07-30 MED ORDER — SODIUM CHLORIDE 0.9 % IV SOLN
250.0000 mL | INTRAVENOUS | Status: DC | PRN
  Administered 2014-07-31: 10 mL via INTRAVENOUS

## 2014-07-30 MED ORDER — KETOROLAC TROMETHAMINE 10 MG PO TABS
10.0000 mg | ORAL_TABLET | Freq: Four times a day (QID) | ORAL | Status: DC
  Administered 2014-07-30 – 2014-07-31 (×4): 10 mg via ORAL
  Filled 2014-07-30 (×8): qty 1

## 2014-07-30 MED ORDER — DIBUCAINE 1 % RE OINT: 1.0000 "application " | TOPICAL_OINTMENT | RECTAL | Status: DC | PRN

## 2014-07-30 MED ORDER — SODIUM CHLORIDE 0.9 % IJ SOLN
3.0000 mL | INTRAMUSCULAR | Status: DC | PRN
  Administered 2014-07-30 (×2): 3 mL via INTRAVENOUS

## 2014-07-30 MED ORDER — PIPERACILLIN-TAZOBACTAM 3.375 G IVPB
3.3750 g | Freq: Three times a day (TID) | INTRAVENOUS | Status: AC
  Administered 2014-07-30 – 2014-07-31 (×3): 3.375 g via INTRAVENOUS
  Filled 2014-07-30 (×4): qty 50

## 2014-07-30 MED ORDER — TETANUS-DIPHTH-ACELL PERTUSSIS 5-2.5-18.5 LF-MCG/0.5 IM SUSP
0.5000 mL | Freq: Once | INTRAMUSCULAR | Status: DC
  Filled 2014-07-30: qty 0.5

## 2014-07-30 MED ORDER — SENNOSIDES-DOCUSATE SODIUM 8.6-50 MG PO TABS
2.0000 | ORAL_TABLET | ORAL | Status: DC
  Administered 2014-07-30 – 2014-07-31 (×2): 2 via ORAL
  Filled 2014-07-30 (×2): qty 2

## 2014-07-30 MED ORDER — TRAMADOL HCL 50 MG PO TABS
50.0000 mg | ORAL_TABLET | Freq: Four times a day (QID) | ORAL | Status: DC | PRN
  Administered 2014-07-30: 50 mg via ORAL
  Administered 2014-07-30: 100 mg via ORAL
  Administered 2014-07-30: 50 mg via ORAL
  Administered 2014-07-31 – 2014-08-01 (×2): 100 mg via ORAL
  Filled 2014-07-30: qty 1
  Filled 2014-07-30: qty 2
  Filled 2014-07-30: qty 1
  Filled 2014-07-30 (×2): qty 2

## 2014-07-30 MED ORDER — DIPHENHYDRAMINE HCL 25 MG PO CAPS: 25.0000 mg | ORAL_CAPSULE | Freq: Four times a day (QID) | ORAL | Status: DC | PRN

## 2014-07-30 MED ORDER — PRENATAL MULTIVITAMIN CH
1.0000 | ORAL_TABLET | Freq: Every day | ORAL | Status: DC
  Administered 2014-07-30 – 2014-07-31 (×2): 1 via ORAL
  Filled 2014-07-30 (×2): qty 1

## 2014-07-30 MED ORDER — SODIUM BICARBONATE 8.4 % IV SOLN
INTRAVENOUS | Status: DC | PRN
  Administered 2014-07-30: 5 mL via EPIDURAL

## 2014-07-30 MED ORDER — LACTATED RINGERS IV SOLN
INTRAVENOUS | Status: DC | PRN
  Administered 2014-07-30 (×2): via INTRAVENOUS

## 2014-07-30 MED ORDER — LACTATED RINGERS IV SOLN
INTRAVENOUS | Status: DC
  Administered 2014-07-30: 17:00:00 via INTRAVENOUS

## 2014-07-30 MED ORDER — LORAZEPAM 2 MG/ML IJ SOLN
1.0000 mg | Freq: Once | INTRAMUSCULAR | Status: AC
  Administered 2014-07-30: 1 mg via INTRAVENOUS
  Filled 2014-07-30: qty 0.5

## 2014-07-30 MED ORDER — LABETALOL HCL 200 MG PO TABS
400.0000 mg | ORAL_TABLET | Freq: Three times a day (TID) | ORAL | Status: DC
  Filled 2014-07-30 (×3): qty 2

## 2014-07-30 MED ORDER — ONDANSETRON HCL 4 MG/2ML IJ SOLN: 4.0000 mg | INTRAMUSCULAR | Status: DC | PRN

## 2014-07-30 MED ORDER — HYDROMORPHONE HCL 2 MG PO TABS
2.0000 mg | ORAL_TABLET | ORAL | Status: DC | PRN
  Administered 2014-07-30 – 2014-07-31 (×4): 2 mg via ORAL
  Filled 2014-07-30 (×4): qty 1

## 2014-07-30 MED ORDER — PIPERACILLIN-TAZOBACTAM 3.375 G IVPB 30 MIN: 3.3750 g | Freq: Three times a day (TID) | INTRAVENOUS | Status: DC

## 2014-07-30 MED ORDER — FENTANYL CITRATE 0.05 MG/ML IJ SOLN
INTRAMUSCULAR | Status: AC
  Administered 2014-07-30: 50 ug via INTRAVENOUS
  Filled 2014-07-30: qty 2

## 2014-07-30 MED ORDER — ONDANSETRON HCL 4 MG PO TABS: 4.0000 mg | ORAL_TABLET | ORAL | Status: DC | PRN

## 2014-07-30 MED ORDER — SIMETHICONE 80 MG PO CHEW: 80.0000 mg | CHEWABLE_TABLET | ORAL | Status: DC | PRN

## 2014-07-30 MED ORDER — SODIUM CHLORIDE 0.9 % IJ SOLN
3.0000 mL | Freq: Two times a day (BID) | INTRAMUSCULAR | Status: DC
  Administered 2014-07-30 – 2014-08-01 (×3): 3 mL via INTRAVENOUS

## 2014-07-30 MED ORDER — PROMETHAZINE HCL 25 MG/ML IJ SOLN: 6.2500 mg | INTRAMUSCULAR | Status: DC | PRN

## 2014-07-30 MED ORDER — MEPERIDINE HCL 25 MG/ML IJ SOLN: 6.2500 mg | INTRAMUSCULAR | Status: DC | PRN

## 2014-07-30 MED ORDER — BENZOCAINE-MENTHOL 20-0.5 % EX AERO
1.0000 "application " | INHALATION_SPRAY | CUTANEOUS | Status: DC | PRN
  Administered 2014-07-30: 1 via TOPICAL
  Filled 2014-07-30 (×2): qty 56

## 2014-07-30 MED ORDER — FENTANYL CITRATE 0.05 MG/ML IJ SOLN
25.0000 ug | INTRAMUSCULAR | Status: DC | PRN
  Administered 2014-07-30 (×3): 50 ug via INTRAVENOUS

## 2014-07-30 MED ORDER — LANOLIN HYDROUS EX OINT: TOPICAL_OINTMENT | CUTANEOUS | Status: DC | PRN

## 2014-07-30 SURGICAL SUPPLY — 11 items
GLOVE BIOGEL PI IND STRL 7.5 (GLOVE) IMPLANT
GLOVE BIOGEL PI INDICATOR 7.5 (GLOVE) ×1
GLOVE INDICATOR 6.5 STRL GRN (GLOVE) ×1 IMPLANT
GLOVE SKINSENSE NS SZ7.0 (GLOVE) ×1
GLOVE SKINSENSE STRL SZ6.0 (GLOVE) ×1 IMPLANT
GLOVE SKINSENSE STRL SZ7.0 (GLOVE) IMPLANT
PACK VAGINAL MINOR WOMEN LF (CUSTOM PROCEDURE TRAY) ×1 IMPLANT
SUT VIC AB 0 CT1 36 (SUTURE) ×1 IMPLANT
TOWEL NATURAL 6PK STERILE (DISPOSABLE) ×2 IMPLANT
TUBING NON-CON 1/4 X 20 CONN (TUBING) ×1 IMPLANT
YANKAUER SUCT BULB TIP NO VENT (SUCTIONS) ×1 IMPLANT

## 2014-07-30 NOTE — Progress Notes (Signed)
To OR for laceration repair. Family aware. Dr. Elly Modena at bedside.

## 2014-07-30 NOTE — Progress Notes (Signed)
CTSP re: patient fell ow to the BR. There is concern expressed about persistent numbness of leg.  This patient had an epidural yesterday with a transient  Right sided leg paresthesia during advancement of the catheter. As mentioned in the procedure note, this paresthesia cleared spontaneously when the catheter was removed, and then the second epidural attempt was without incident.     Now on the same Right side the patient has an abnormal neuro exam significant for decreased ankle dorsi and plantar flexion, decreased knee extension, decreased hip flexion and diffusely decreased sensation on the right leg as compared to the left. This numbness appears to go as high as T10, but the pattern does not follow dermatomal distribution. She has markedly decreased rectal tone as well. 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 It may  be birth trauma.  I have discussed this with Dr Harolyn Rutherford, she will request imaging and a neurology consult, and in addition the patient will need OT to help her with ambulation This was discussed with the patient and her mother was present.  Questions answered.

## 2014-07-30 NOTE — Progress Notes (Signed)
Prepping for OR.

## 2014-07-30 NOTE — Anesthesia Postprocedure Evaluation (Addendum)
Anesthesia Post Note  [name]  Procedure(s) Performed: Procedure(s) (LRB): REPAIR VAGINAL CUFF (N/A)  Anesthesia type: Epidural  Patient location: Mother/Baby  Post pain: Pain level controlled  Post assessment: Post-op Vital signs reviewed  Last Vitals:  Filed Vitals:   07/30/14 0812  BP:   Pulse: 93  Temp:   Resp: 18    Post vital signs: Reviewed  Level of consciousness:resting  Complications: No apparent anesthesia complications, legs mobile, will re-check @ 1400.

## 2014-07-30 NOTE — Lactation Note (Signed)
This note was copied from the chart of Penny [name]. Lactation Consultation Note  Patient Name: Penny [name] [id number] Date: 07/30/2014 Reason for consult: Other (Comment) (formula exclusion)   Maternal Data Formula Feeding for Exclusion: Yes Reason for exclusion: Mother's choice to formula feed on admision;Admission to Intensive Care Unit (ICU) post-partum  Feeding Feeding Type: Formula Nipple Type: Slow - flow Length of feed: 10 min  LATCH Score/Interventions                      Lactation Tools Discussed/Used     Consult Status Consult Status: Complete    Tonna Corner 07/30/2014, 4:28 PM

## 2014-07-30 NOTE — Progress Notes (Addendum)
Stage 1 pph activated-EBL at this time is 58ml. Vitals WNL but BP lower than baseline. CBC ordered. Type and screen done. Dr Elly Modena does not want type and cross at this time, fine with T&S. OBRR and charge nurse at bedside. Dr. Elly Modena remains at bedside repairing laceration. Anesthesia notified. 2 Ivs working properly. Cytotec and hemabate at bedside, no orders to administer at this time.

## 2014-07-30 NOTE — Consult Note (Signed)
Neurology Consultation Reason for Consult: Right leg weakness Referring Physician: Verita Schneiders  CC: Right leg weakness and numbness  History is obtained from: Patient, husband  HPI: Penny [name] is a 28 y.o. female with a history of migraines including migraines with aura of paresthesias. She has had a complicated course over the past few days including presentation with severe hypertension felt to be eclampsia. She also had severe headache and just prior to starting the magnesium drip began having left-sided weakness and numbness. The symptoms improved rapidly following starting the magnesium drip. When she had her epidural catheter placed, she reports that her right leg went numb and has been numb since that time. Notes report that her numbness improved following removal of the catheter.  Currently she states that her right-sided numbness seems to be improving, but still left with the ability to bear weight on her right leg.    ROS: A 14 point ROS was performed and is negative except as noted in the HPI.   Past Medical History  Diagnosis Date  . Migraines   . Interstitial cystitis   . IUD FEB 2010  . Gastritis JULY 2011  . Depression   . 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 Fatty liver   . Obesity (BMI 30-39.9) 2011 228 LBS BMI 36.8  . Iron deficiency anemia 07/23/2010  . Irritable bowel syndrome 2012 DIARRHEA    JUN 2012 TTG IgA 14.9  . Elevated liver enzymes JUL 2011ALK PHOS 111-127 AST  143-267 ALT  213-321T BILI 0.6  ALB  3.7-4.06 Jun 2011 ALK PHOS 118 AST 24 ALT 42 T BILI 0.4 ALB 3.9  . Chronic daily headache   . Ovarian cyst   . PONV (postoperative nausea and vomiting)   . ADD (attention deficit disorder)   . RLQ abdominal pain 07/18/2013  . Menorrhagia 07/18/2013  . Anxiety   . Potassium (K) deficiency   . Dysmenorrhea 09/18/2013  . Stress 09/18/2013  . Patient desires pregnancy 09/18/2013  . Pregnant 12/25/2013  . Blood transfusion without reported  diagnosis   . Heart murmur   . Gastric bypass status for obesity     Family History: Grandmother-migraines with unilateral weakness  Social History: Tob: Current smoker  Exam: Current vital signs: BP 130/88  Pulse 97  Temp(Src) 98.1 F (36.7 C) (Oral)  Resp 20  Ht _0  (1.651 m)  Wt 108.183 kg (238 lb 8 oz)  BMI 39.69 kg/m2  SpO2 99%  LMP 11/19/2013  Breastfeeding? Unknown Vital signs in last 24 hours: Temp:  [98.1 F (36.7 C)-98.6 F (37 C)] 98.1 F (36.7 C) (08/25 2000) Pulse Rate:  [71-134] 97 (08/25 2154) Resp:  [14-29] 20 (08/25 2154) BP: (104-149)/(60-99) 130/88 mmHg (08/25 2154) SpO2:  [96 %-100 %] 99 % (08/25 2154) Weight:  [108.183 kg (238 lb 8 oz)] 108.183 kg (238 lb 8 oz) (08/25 0500)  General: In bed, NAD CV: Regular rate and rhythm Mental Status: Patient is awake, alert, interactive and appropriate Immediate and remote memory are intact.Patient is able to give a clear and coherent history. No signs of aphasia or neglect Cranial Nerves: II: Visual Fields are full, though the patient has received diplopia in the right lower visual quadrant. Pupils are equal, round, and reactive to light.  Discs are difficult to visualize. III,IV, VI: EOMI without ptosis or diploplia.  V: Facial sensation is symmetric to temperature VII: Facial movement is symmetric.  VIII: hearing is intact  to voice X: Uvula elevates symmetrically XI: Shoulder shrug is symmetric. XII: tongue is midline without atrophy or fasciculations.  Motor: Tone is normal. Bulk is normal. 5/5 strength was present in bilateral arms and left leg. She has 4/5 ankle dorsiflexion and plantarflexion, 4/5 knee extension and flexion. 4/5 hip flexion.  Sensory: Sensation is decreased on the lateral aspect of her thigh and leg and above the thigh up to apprximately the T9 - T10 region.  Deep Tendon Reflexes: 2+ and symmetric in the biceps and patellae and ankles.  Cerebellar: FNF intact  bilaterally Gait: Not tested due to weakness   I have reviewed labs in epic and the results pertinent to this consultation are: cmp - low albumin anemic  I have reviewed the images obtained:MRI L-spine - no findigns to explain her exam.   Impression: 28 yo F with right leg numbness and weakness following delivery. With neuro symptoms prior to her anesthesia and with persistent visual changes, I think that PRES(posterior reversible encephalopathy syndrome) would be a distinct possibility. Also, given how high her sensory change goes, I would favor imaging her thoracic spinal cord as well. With her history of migraine with aura, this would also be a consideration. Finally, a lumbosacral neurapraxia from compression could be a possibility, but I would not expect it to extend is high, and her reflexes appear to be more preserved than I would expect.  Recommendations: 1) MRI brain and T-spine 2) further recommendations following these studies.   Roland Rack, MD [facility] [phone]  If 7pm- 7am, please page neurology on call as listed in [place].

## 2014-07-30 NOTE — Op Note (Signed)
Penny [name]   PROCEDURE DATE: 07/27/2014 - 07/30/2014  PREOPERATIVE DIAGNOSIS: Vaginal laceration s/p spontaneous vaginal delivery   POSTOPERATIVE DIAGNOSIS: The same PROCEDURE: Repair of second degree laceration SURGEON:  Dr. Mora Bellman  INDICATIONS: 28 y.o. [id number] s/p SVD with bilateral deep sulcal laceration.  Written informed consent was obtained.    FINDINGS:  Bilateral sulcal laceration, intact cervix, no periurethral laceration, normal rectal exam  ANESTHESIA:  Epidural INTRAVENOUS FLUIDS: 3200  ml ESTIMATED BLOOD LOSS: 1000 ml total since delivery COMPLICATIONS: None immediate  PROCEDURE IN DETAIL:  The patient was taken to the operating room where anesthesia was administered and was found to be adequate.  She was placed in the dorsal lithotomy, and was prepped and draped in a sterile manner. After an adequate timeout was performed, a weighted speculum was used to aid in the visualization of the apex of the lacerations. 0-Vicryl was use to repair the laceration in a running locked fashion. Excellent hemostasis was noted with good restoration of the anatomy. Vault was empty. Fundus firm.  All instruments were removed from the patient's vagina.  Instrument, needle and sponge counts were correct x 2. The patient tolerated the procedure well, and was taken to the recovery area in stable condition.

## 2014-07-30 NOTE — Progress Notes (Signed)
Dr. Elly Modena discussing with pt and family the need to repair laceration in OR for better visual. EBL now 824ml. Type and cross performed. CRNA, OBRR, charge nurse, and Dr. Elly Modena remain at bedside. Still trying to obtain CBC.

## 2014-07-30 NOTE — Transfer of Care (Signed)
Immediate Anesthesia Transfer of Care Note  [name]  Procedure(s) Performed: Procedure(s): REPAIR VAGINAL CUFF (N/A)  Patient Location: PACU  Anesthesia Type:Epidural  Level of Consciousness: awake, sedated and patient cooperative  Airway & Oxygen Therapy: Patient Spontanous Breathing  Post-op Assessment: Report given to PACU RN and Post -op Vital signs reviewed and stable  Post vital signs: Reviewed and stable  Complications: No apparent anesthesia complications

## 2014-07-30 NOTE — Anesthesia Postprocedure Evaluation (Signed)
Anesthesia Post Note  [name]  Procedure(s) Performed: Procedure(s) (LRB): REPAIR VAGINAL CUFF (N/A)  Anesthesia type: Epidural  Patient location:  AICU  Post pain: Pain level controlled  Post assessment: Post-op Vital signs reviewed  Last Vitals:  Filed Vitals:   07/30/14 1228  BP:   Pulse:   Temp: 36.9 C  Resp:     Post vital signs: Reviewed  Level of consciousness:alert  Complications: No apparent anesthesia complications.legs mobile, but continues with right outer thigh numbness.

## 2014-07-30 NOTE — Progress Notes (Signed)
Post Partum Day 1  Subjective: Patient reports some pelvic pressure this morning which is similar to her IC pain. She desires to have foley catheter removed. She denies CP, SOB, lightheadedness/dizziness. She has not gotten out of bed yet. She denies headache, visual disturbances, RUQ/epigastric pain  Objective: Blood pressure 120/76, pulse 80, temperature 98.4 F (36.9 C), temperature source Oral, resp. rate 20, height 5\' 5"  (1.651 m), weight 238 lb 8 oz (108.183 kg), last menstrual period 11/19/2013, SpO2 100.00%, unknown if currently breastfeeding.  Physical Exam:  General: alert, cooperative and no distress Lochia: appropriate Uterine Fundus: firm, NT DVT Evaluation: No evidence of DVT seen on physical exam. Negative Homan's sign.   Recent Labs  07/29/14 1125 07/30/14 0111  HGB 10.1* 6.9*  HCT 31.1* 20.7*    Assessment/Plan: Patient progressing well s/p SVD with bilateral sulcal tears repaired in OR on magnesium sulfate for seizure prophylaxis - Continue magnesium sulfate until 24 hours since delivery - D/c foley catheter but continue strict I&O - Check CBC at 9:30 am s/p 2 units pRBC   LOS: 3 days   Ira Busbin 07/30/2014, 6:54 AM

## 2014-07-30 NOTE — Addendum Note (Signed)
Addendum created 07/30/14 1409 by Flossie Dibble, CRNA   Modules edited: Notes Section   Notes Section:  File: [id number]; File: [id number]

## 2014-07-30 NOTE — Progress Notes (Signed)
Blood Transfusion:  2 units released to CRNA and given in PACU.  Blood verified by Daiva Eves, RN and Lyndle Herrlich, CRNA.  Unit 1 started at 1:20 and stopped at 2:00 335 ml transfused.  Unit 2 started at 2:00 and currently running.  Unit 2 verified by Daiva Eves, RN and Lyndle Herrlich, CRNA.  Blood transfusion paper charted not in EPIC, because of Lab release error.  AC Tanya called to PACU to verify blood charting errors and told this RN to document blood administration in Paper Chart.

## 2014-07-30 NOTE — Anesthesia Preprocedure Evaluation (Signed)
Anesthesia Evaluation  Patient identified by MRN, date of birth, ID band Patient awake    Reviewed: Allergy & Precautions, H&P , Patient's Chart, lab work & pertinent test results  Airway Mallampati: II TM Distance: >3 FB Neck ROM: full    Dental  (+) Teeth Intact   Pulmonary former smoker,  breath sounds clear to auscultation        Cardiovascular hypertension, On Medications Rhythm:regular Rate:Normal     Neuro/Psych    GI/Hepatic   Endo/Other  Morbid obesity  Renal/GU      Musculoskeletal   Abdominal   Peds  Hematology  (+) anemia ,   Anesthesia Other Findings       Reproductive/Obstetrics (+) Pregnancy                           Anesthesia Physical  Anesthesia Plan  ASA: III  Anesthesia Plan: Epidural   Post-op Pain Management:    Induction:   Airway Management Planned:   Additional Equipment:   Intra-op Plan:   Post-operative Plan:   Informed Consent: I have reviewed the patients History and Physical, chart, labs and discussed the procedure including the risks, benefits and alternatives for the proposed anesthesia with the patient or authorized representative who has indicated his/her understanding and acceptance.   Dental Advisory Given  Plan Discussed with:   Anesthesia Plan Comments: (Labs checked- platelets confirmed with RN in room. Fetal heart tracing, per RN, reported to be stable enough for sitting procedure. Discussed epidural, and patient consents to the procedure:  included risk of possible headache,backache, failed block, allergic reaction, and nerve injury. This patient was asked if she had any questions or concerns before the procedure started.  Pt presents to the OR with a vaginal laceration with her epidural in-situ.)        Anesthesia Quick Evaluation

## 2014-07-30 NOTE — Progress Notes (Signed)
35.25ml fentanyl wasted with Archie Balboa RN as second witness

## 2014-07-30 NOTE — Progress Notes (Signed)
Dr Seward Speck, MDA in pt rm - 0915 PLT =147 - MD approved removal of epidural catheter.

## 2014-07-30 NOTE — Progress Notes (Signed)
Consulted Dr. Leonel Ramsay (Neurology) and informed him about patient; he reported that he may be able to come by tonight to see patient.  Also ordered PT Eval and Treat consult.  Will follow up recommendations.  Will continue magnesium sulfate until later tonight as scheduled; routine postpartum care.   Osborne Oman, MD 07/30/2014 9:17 PM

## 2014-07-30 NOTE — Progress Notes (Signed)
To [facility] via [place] for MRI

## 2014-07-30 NOTE — Addendum Note (Signed)
Addendum created 07/30/14 0830 by Flossie Dibble, CRNA   Modules edited: Notes Section   Notes Section:  File: [id number]

## 2014-07-30 NOTE — Progress Notes (Signed)
Pt requested to go to BR to have a BM. Orthostatic VS done with pt sitting herself up on the side of bed & then standing at the bedside holding onto the [facility] & without difficulty while BP/HR being taken. Pt held on to the [facility] to ambulate & when she took a step forward her right leg buckled under her & she went down on her knees. Pt assisted back to bed by myself & her husband. Pt denies pain in her knees. Marlou Porch, CNM & Dr Primitivo Gauze, MDA both notified.

## 2014-07-30 NOTE — Anesthesia Postprocedure Evaluation (Signed)
Anesthesia Post Note  [name]  Procedure(s) Performed: * No procedures listed *  Anesthesia type: Epidural  Patient location: Mother/Baby  Post pain: Pain level controlled  Post assessment: Post-op Vital signs reviewed  Last Vitals:  Filed Vitals:   07/30/14 0812  BP:   Pulse: 93  Temp:   Resp: 18    Post vital signs: Reviewed  Level of consciousness:alert  Complications: No apparent anesthesia complications

## 2014-07-30 NOTE — Progress Notes (Signed)
Patient ID: Penny [name], female   DOB: 07/26/1986, 28 y.o.   MRN: [id number] Have been in communication w/ RN and Anesthesia RE: pt right outer thigh paresthesia. MRI neg for epidural hematoma. Pt reports to RN that sensation is improving. Pt up to BR w/ steady. Able to void and have BM. Reported uncontrolled perineal pain today. Requesting IV Dilaudid. Rarely using ice packs. Perineal pain improving w/ ice glove. Changed Ibuprofen to Toradol PO scheduled. Since pt is tolerating POs and had not tolerated Percocet in the past ordered Ultram PRN for moderate pain and Dilaudid PO for severe pain.    Anesthesia recommends neuro consult. Will order.  [place], [place] 07/30/2014 7:14 PM

## 2014-07-30 NOTE — Anesthesia Postprocedure Evaluation (Signed)
Anesthesia Post Note  [name]  Procedure(s) Performed: Procedure(s) (LRB): REPAIR VAGINAL CUFF (N/A)  Anesthesia type: Epidural  Patient location: PACU  Post pain: Pain level controlled  Post assessment: Post-op Vital signs reviewed  Last Vitals:  Filed Vitals:   07/30/14 0145  BP: 125/82  Pulse: 93  Temp: 37 C  Resp: 29    Post vital signs: Reviewed  Level of consciousness: awake  Complications: No apparent anesthesia complications

## 2014-07-31 ENCOUNTER — Encounter (HOSPITAL_COMMUNITY): Payer: Self-pay | Admitting: Obstetrics and Gynecology

## 2014-07-31 ENCOUNTER — Ambulatory Visit (HOSPITAL_COMMUNITY)
Admit: 2014-07-31 | Discharge: 2014-07-31 | Disposition: A | Discharge status: Home / Self Care | Payer: Medicaid Other | Attending: Neurology | Admitting: Neurology

## 2014-07-31 ENCOUNTER — Ambulatory Visit (HOSPITAL_COMMUNITY)
Admission: RE | Admit: 2014-07-31 | Discharge: 2014-07-31 | Disposition: A | Discharge status: Home / Self Care | Payer: Medicaid Other | Source: Ambulatory Visit | Attending: Neurology | Admitting: Neurology

## 2014-07-31 DIAGNOSIS — R569 Unspecified convulsions: Secondary | ICD-10-CM

## 2014-07-31 DIAGNOSIS — R200 Anesthesia of skin: Secondary | ICD-10-CM

## 2014-07-31 DIAGNOSIS — R209 Unspecified disturbances of skin sensation: Secondary | ICD-10-CM | POA: Insufficient documentation

## 2014-07-31 HISTORY — DX: Unspecified convulsions: R56.9

## 2014-07-31 LAB — TYPE AND SCREEN
ABO/RH(D): A POS
Antibody Screen: NEGATIVE
UNIT DIVISION: 0
Unit division: 0

## 2014-07-31 LAB — CULTURE, BETA STREP (GROUP B ONLY)

## 2014-07-31 MED ORDER — LABETALOL HCL 5 MG/ML IV SOLN: 20.0000 mg | INTRAVENOUS | Status: AC

## 2014-07-31 MED ORDER — KETOROLAC TROMETHAMINE 30 MG/ML IJ SOLN
30.0000 mg | Freq: Four times a day (QID) | INTRAMUSCULAR | Status: AC
  Administered 2014-07-31 – 2014-08-01 (×4): 30 mg via INTRAVENOUS
  Filled 2014-07-31 (×4): qty 1

## 2014-07-31 MED ORDER — AMLODIPINE BESYLATE 5 MG PO TABS
5.0000 mg | ORAL_TABLET | Freq: Every day | ORAL | Status: DC
  Administered 2014-07-31 – 2014-08-01 (×2): 5 mg via ORAL
  Filled 2014-07-31 (×3): qty 1

## 2014-07-31 MED ORDER — LORAZEPAM 1 MG PO TABS
1.0000 mg | ORAL_TABLET | Freq: Four times a day (QID) | ORAL | Status: DC | PRN
  Administered 2014-07-31: 1 mg via ORAL
  Filled 2014-07-31: qty 1

## 2014-07-31 MED ORDER — HYDROMORPHONE HCL 2 MG PO TABS
2.0000 mg | ORAL_TABLET | ORAL | Status: DC | PRN
  Administered 2014-07-31 – 2014-08-01 (×5): 2 mg via ORAL
  Filled 2014-07-31 (×5): qty 1

## 2014-07-31 MED ORDER — CLONAZEPAM 0.5 MG PO TABS
0.5000 mg | ORAL_TABLET | Freq: Two times a day (BID) | ORAL | Status: DC | PRN
  Administered 2014-07-31 – 2014-08-01 (×2): 0.5 mg via ORAL
  Filled 2014-07-31 (×2): qty 1

## 2014-07-31 MED ORDER — ZOLPIDEM TARTRATE 5 MG PO TABS
5.0000 mg | ORAL_TABLET | Freq: Every evening | ORAL | Status: DC | PRN
  Administered 2014-07-31 (×2): 5 mg via ORAL
  Filled 2014-07-31 (×2): qty 1

## 2014-07-31 MED ORDER — ESCITALOPRAM OXALATE 10 MG PO TABS
10.0000 mg | ORAL_TABLET | Freq: Every day | ORAL | Status: DC
  Administered 2014-07-31 – 2014-08-01 (×2): 10 mg via ORAL
  Filled 2014-07-31 (×3): qty 1

## 2014-07-31 NOTE — Progress Notes (Signed)
Patient complaining of continue pain , primarily complaining of uterine cramping type pain, noted on left side greater than right still. Pt is bottle feeding. Pt is s/p gastric bypass, so GI absorption rates may be uncertain, so will use IV toradol 30 mg q 6h x 4 doses to see if improved control can be obtained.

## 2014-07-31 NOTE — Progress Notes (Signed)
Pt called for a bed pan to void. Pt used her left leg to lift her buttocks off the bed while her spouse lifted her right side to place bed pan under her. When finished & while RN did peri care, patient used both legs with bent knees to lift herself off the bed multiple times.

## 2014-07-31 NOTE — Progress Notes (Signed)
Post Partum Day 2   Subjective: Patient was evaluated by Anesthesiology and Neurology yesterday for her abnormal RLE neurologic exam and symptoms; appreciate their input.  Had negative MRI of the LS spine,; Neurology recommends MRI of brain and T spine today.   Patient still reports inability to ambulate well or bear weight on her right leg.   Patient is off magnesium sulfate since last night. BP stable, no need for antihypertensives.  Objective:  Temp:  [98.1 F (36.7 C)-98.4 F (36.9 C)] 98.4 F (36.9 C) (08/26 0000) Pulse Rate:  [71-101] 90 (08/26 0300) Resp:  [16-22] 22 (08/26 0300) BP: (114-148)/(67-97) 148/91 mmHg (08/26 0600) SpO2:  [97 %-100 %] 98 % (08/26 0300)  Physical Exam:  General: alert, cooperative and no distress Lochia: appropriate Lungs: CTAB Abdomen: soft, NT Uterine Fundus: firm, NT DVT Evaluation: Increased edema of R leg > L leg. No clots palpated. Ext: Decreased sensation on the lateral aspect of her thigh and leg, 2+ BLE DTRs   CBC Latest Ref Rng 07/30/2014 07/30/2014 07/29/2014  WBC 4.0 - 10.5 K/uL 10.6(H) 10.9(H) 8.4  Hemoglobin 12.0 - 15.0 g/dL 9.1(L) 6.9(LL) 10.1(L)  Hematocrit 36.0 - 46.0 % 27.1(L) 20.7(L) 31.1(L)  Platelets 150 - 400 K/uL 147(L) 136(L) 171    Assessment/Plan: PPD#2 s/p SVD with bilateral sulcal tears repaired in OR, now s/p magnesium sulfate for eclampsia prophylaxis.  Postpartum course complicated by right leg numbness and weakness following delivery; being evaluated by Neurology -Will follow up MRI studies and Neurology recommendations. Appreciate their input. -Given immobility and R leg edema, will obtain RLE doppler to evaluate for possible DVT -Continue to watch BP, will add medications if needed -Routine postpartum care     LOS: 4 days   Osborne Oman, MD 07/31/2014, 6:55 AM

## 2014-07-31 NOTE — Evaluation (Signed)
Physical Therapy Evaluation Patient Details Name: Penny [name] MRN: [id number] DOB: 1985-12-30 Today's Date: 07/31/2014   History of Present Illness  Pt adm with preclampsia and delivered baby vaginally on 07/30/14. Pt with rt leg numbness, weakness, and pain. MRI of brain, L-spine, and T-spine all neg.  Clinical Impression  Pt admitted with above. Pt currently with functional limitations due to the deficits listed below (see PT Problem List).  Pt will benefit from skilled PT to increase their independence and safety with mobility to allow discharge home with husband. Pt did well with walker and will plan to see pt one more time acutely in the AM.      Follow Up Recommendations Outpatient PT    Equipment Recommendations  Rolling walker with 5" wheels (wide walker due to hip width)    Recommendations for Other Services       Precautions / Restrictions Precautions Precautions: None      Mobility  Bed Mobility Overal bed mobility: Needs Assistance Bed Mobility: Supine to Sit;Sit to Supine     Supine to sit: Supervision Sit to supine: Min assist   General bed mobility comments: Assist to bring leg back up into bed. Incr time.  Transfers Overall transfer level: Needs assistance Equipment used: Rolling walker (2 wheeled) Transfers: Sit to/from Stand Sit to Stand: Min guard         General transfer comment: Pt pulling up on walker instead of pushing up from surface. Verbal cues for hand placement when returning to sitting.  Ambulation/Gait Ambulation/Gait assistance: Supervision Ambulation Distance (Feet): 150 Feet Assistive device: Rolling walker (2 wheeled) Gait Pattern/deviations: Step-through pattern;Decreased stance time - right Gait velocity: slow Gait velocity interpretation: Below normal speed for age/gender General Gait Details: Verbal cue to not step past walker with RLE after which pt able to perform correctly.  Stairs            Wheelchair  Mobility    Modified Rankin (Stroke Patients Only)       Balance Overall balance assessment: No apparent balance deficits (not formally assessed)                                           Pertinent Vitals/Pain Pain Assessment: 0-10 Pain Score: 8  Pain Location: rt knee and lower leg Pain Descriptors / Indicators: Burning Pain Intervention(s): Limited activity within patient's tolerance;Premedicated before session;Repositioned    Home Living Family/patient expects to be discharged to:: Private residence Living Arrangements: Spouse/significant other Available Help at Discharge: Family;Available 24 hours/day (initially husband will be home) Type of Home: House Home Access: Stairs to enter Entrance Stairs-Rails: Doctor, general practice of Steps: 3 Home Layout: One level Home Equipment: None      Prior Function Level of Independence: Independent               Hand Dominance        Extremity/Trunk Assessment   Upper Extremity Assessment: Overall WFL for tasks assessed           Lower Extremity Assessment: RLE deficits/detail RLE Deficits / Details: Pt able to actively move against gravity including ability to perform straight leg raise. Limited dorsiflexion due to pain and edema.       Communication   Communication: No difficulties  Cognition Arousal/Alertness: Awake/alert Behavior During Therapy: WFL for tasks assessed/performed Overall Cognitive Status: Within Functional Limits for tasks assessed  General Comments      Exercises        Assessment/Plan    PT Assessment Patient needs continued PT services  PT Diagnosis Difficulty walking;Acute pain   PT Problem List Decreased mobility;Decreased knowledge of use of DME;Impaired sensation;Pain;Decreased strength  PT Treatment Interventions DME instruction;Gait training;Stair training;Functional mobility training;Therapeutic  activities;Therapeutic exercise;Patient/family education   PT Goals (Current goals can be found in the Care Plan section) Acute Rehab PT Goals Patient Stated Goal: for leg to get better PT Goal Formulation: With patient Time For Goal Achievement: 08/02/14 Potential to Achieve Goals: Good    Frequency Min 3X/week   Barriers to discharge        Co-evaluation               End of Session   Activity Tolerance: Patient tolerated treatment well Patient left: in bed;with call bell/phone within reach;with family/visitor present Nurse Communication: Mobility status         Time: 1540-1606 PT Time Calculation (min): 26 min   Charges:   PT Evaluation $Initial PT Evaluation Tier I: 1 Procedure PT Treatments $Gait Training: 8-22 mins   PT G Codes:          Kathlene Yano 2014-08-24, 4:28 PM  [facility] PT [phone]

## 2014-07-31 NOTE — Progress Notes (Signed)
Pt transported by [facility] to [facility] for doppler studies & MRIs. PT notified that pt would not be here @11 :00 - evaluation will be rescheduled for this afternoon - PT to call back.

## 2014-07-31 NOTE — Progress Notes (Signed)
Right lower extremity venous duplex completed.  Right:  No evidence of DVT, superficial thrombosis, or Baker's cyst.  Left:  Negative for DVT in the common femoral vein.  

## 2014-07-31 NOTE — Transfer of Care (Signed)
Immediate Anesthesia Transfer of Care Note  [name]  Procedure(s) Performed: CT Head  Patient Location: PACU  Anesthesia Type:Sedation  Level of Consciousness: Awake  Airway & Oxygen Therapy: breathing room air  Post-op Assessment: stable  Post vital signs: VSSAF Complications: None

## 2014-07-31 NOTE — Progress Notes (Signed)
Subjective: Patient states her right leg numbness has improved significantly.  He main complaint now is right knee pain when moving legs.  She feels her right lateral foot and right lateral thigh still has decreased sensation but again this has improved. She states she had spasm in her right leg but this did not wake her up (she was told by her husband) but then states it eventually woke her up. She attempted to stand up this AM but due to pain she was not able to stand.   Objective: Current vital signs: BP 146/87  Pulse 90  Temp(Src) 98.4 [name] (36.9 C) (Oral)  Resp 16  Ht 5\' 5"  (1.651 m)  Wt 108.183 kg (238 lb Penny oz)  BMI 39.69 kg/m2  SpO2 98%  LMP 11/19/2013  Breastfeeding? Unknown Vital signs in last 24 hours: Temp:  [98.1 [name] (36.7 C)-98.4 [name] (36.9 C)] 98.4 [name] (36.9 C) (08/26 0000) Pulse Rate:  [76-101] 90 (08/26 0300) Resp:  [16-22] 16 (08/26 0833) BP: (116-148)/(67-97) 146/87 mmHg (08/26 0700) SpO2:  [97 %-100 %] 98 % (08/26 0300)  Intake/Output from previous day: 08/25 0701 - 08/26 0700 In: 3925.6 [P.O.:2080; I.V.:1695.6; IV Piggyback:150] Out: 1850 [Urine:1850] Intake/Output this shift: Total I/O In: -  Out: 850 [Urine:850] Nutritional status: General  Neurologic Exam: General: NAD Mental Status: Alert, oriented, thought content appropriate.  Speech fluent without evidence of aphasia.  Able to follow 3 step commands without difficulty. Cranial Nerves: II: Discs flat bilaterally; Visual fields grossly normal, pupils equal, round, reactive to light and accommodation III,IV, VI: ptosis not present, extra-ocular motions intact bilaterally V,VII: smile symmetric, facial light touch sensation normal bilaterally VIII: hearing normal bilaterally IX,X: gag reflex present XI: bilateral shoulder shrug XII: midline tongue extension without atrophy or fasciculations  Motor: Right : Upper extremity   5/5    Left:     Upper extremity   5/5  Lower extremity   See note    Lower  extremity   5/5 --right hip flexion was 5/5, knee extension (limited by knee pain) but still 4/5, knee flexion 4/5 DF/PF 5/5 with give way weakness.  Tone and bulk:normal tone throughout; no atrophy noted Sensory: states she has decreased sensation on the lateral aspect of her right leg, lateral knee and lateral aspect of foot. (this has improved) no change in sensation above her thigh Deep Tendon Reflexes:  Right: Upper Extremity   Left: Upper extremity   biceps (C-5 to C-6) 2/4   biceps (C-5 to C-6) 2/4 tricep (C7) 2/4    triceps (C7) 2/4 Brachioradialis (C6) 2/4  Brachioradialis (C6) 2/4  Lower Extremity Lower Extremity  quadriceps (L-2 to L-4) 2/4   quadriceps (L-2 to L-4) 2/4 Achilles (S1) 2/4   Achilles (S1) 2/4  Plantars: Right: downgoing   Left: downgoing Cerebellar: normal finger-to-nose,  normal heel-to-shin test with left leg and was not able to do on right leg secondary to pain Gait: not tested CV: pulses palpable throughout    Lab Results: Basic Metabolic Panel:  Recent Labs Lab 07/25/14 1915 07/27/14 1820 07/28/14 2233  NA 134* 140  --   K 4.9 4.1  --   CL 104 109  --   CO2 20 18*  --   GLUCOSE 89 76  --   BUN Penny 14  --   CREATININE 0.67 0.64 0.64  CALCIUM Penny.3* Penny.4  --     Liver Function Tests:  Recent Labs Lab 07/27/14 1820  AST 16  ALT 10  ALKPHOS 159*  BILITOT 0.5  PROT 5.6*  ALBUMIN 2.7*   No results found for this basename: LIPASE, AMYLASE,  in the last 168 hours No results found for this basename: AMMONIA,  in the last 168 hours  CBC:  Recent Labs Lab 07/25/14 1915 07/27/14 1820 07/29/14 1125 07/30/14 0111 07/30/14 0913  WBC 9.6 9.0 Penny.4 10.9* 10.6*  HGB 10.1* 10.5* 10.1* 6.9* 9.1*  HCT 29.7* 32.1* 31.1* 20.7* 27.1*  MCV 93.1 93.0 94.Penny 94.5 90.9  PLT 202 224 171 136* 147*    Cardiac Enzymes: No results found for this basename: CKTOTAL, CKMB, CKMBINDEX, TROPONINI,  in the last 168 hours  Lipid Panel: No results found for  this basename: CHOL, TRIG, HDL, CHOLHDL, VLDL, LDLCALC,  in the last 168 hours  CBG: No results found for this basename: GLUCAP,  in the last 168 hours  Microbiology: Results for orders placed during the hospital encounter of 07/27/14  CULTURE, BETA STREP (GROUP B ONLY)     Status: None   Collection Time    07/27/14  6:45 PM      Result Value Ref Range Status   Specimen Description VAGINAL/RECTAL   Final   Special Requests NONE   Final   Culture     Final   Value: Culture reincubated for better growth     Performed at [facility]   Report Status PENDING   Incomplete  GROUP B STREP BY PCR     Status: Abnormal   Collection Time    07/28/14  6:50 PM      Result Value Ref Range Status   Group B strep by PCR POSITIVE (*) NEGATIVE Final   Comment: RESULT CALLED TO, READ BACK BY AND VERIFIED WITH:     LASHLEY,K. AT 2002 ON Penny/23/2015 BY HOUEGNIFIO M  OB RESULTS CONSOLE GBS     Status: None   Collection Time    07/28/14  6:50 PM      Result Value Ref Range Status   GBS Positive   Final  MRSA PCR SCREENING     Status: None   Collection Time    07/30/14  5:35 AM      Result Value Ref Range Status   MRSA by PCR NEGATIVE  NEGATIVE Final   Comment:            The GeneXpert MRSA Assay (FDA     approved for NASAL specimens     only), is one component of a     comprehensive MRSA colonization     surveillance program. It is not     intended to diagnose MRSA     infection nor to guide or     monitor treatment for     MRSA infections.    Coagulation Studies: No results found for this basename: LABPROT, INR,  in the last 72 hours  Imaging: Mr Lumbar Spine Wo Contrast  Penny/25/2015   CLINICAL DATA:  28 year old female status post vaginal delivery with epidural anesthesia, now with lumbosacral plexopathy. Initial encounter. Possible epidural hematoma.  EXAM: MRI LUMBAR SPINE WITHOUT CONTRAST  TECHNIQUE: Multiplanar, multisequence MR imaging of the lumbar spine was performed. No  intravenous contrast was administered.  COMPARISON:  CT Abdomen and Pelvis 11/13/2012.  FINDINGS: Large body habitus. Normal lumbar segmentation demonstrated on the comparison. Stable, normal vertebral height and alignment. No marrow edema or evidence of acute osseous abnormality.  Visualized lower thoracic spinal cord is normal with conus medularis at L1.  Edema  in the posterior subcutaneous fat, a common finding in immobilized patient. Trace pelvic free fluid, likely physiologic. Postpartum uterus partially visible. Other visualized abdominal viscera are within normal limits.  T11-T12:  Negative.  T12-L1:  Negative.  L1-L2:  Negative.  L2-L3:  Negative disc.  Mild facet hypertrophy, no stenosis.  L3-L4: Mild disc desiccation. Mild disc bulge. No definite disc herniation. Mild facet hypertrophy. Mild flattening of the ventral thecal sac without spinal stenosis or convincing neural impingement.  L4-L5: Disc desiccation. Minor disc bulging. Superimposed small right paracentral disc protrusion with annular fissure, best seen on series 7, image 24). Disc in proximity to the descending right L5 nerve roots, but no spinal or lateral recess stenosis. Mild facet hypertrophy. No foraminal stenosis.  L5-S1: Negative disc. Mild facet hypertrophy greater on the right. 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  No epidural process or mass in the visualized lower thoracic or lumbosacral spine.  IMPRESSION: 1. Negative for lumbar epidural hematoma. No lumbar spinal stenosis. 2. Mild L3-L4 and L4-L5 disc degeneration. Small disc herniation at the latter directed toward the right. No definite nerve root mass effect, but still this could be a source for right L5 radiculitis. 3. Intermittent mild to moderate lumbar facet hypertrophy. 4. Postpartum uterus and trace pelvic free fluid.   Electronically Signed   By: Lars Pinks M.D.   On: 07/30/2014 15:15    Medications:  Scheduled: . docusate sodium  100 mg  Oral Daily  . ketorolac  10 mg Oral 4 times per day  . prenatal multivitamin  1 tablet Oral Q1200  . senna-docusate  2 tablet Oral Q24H  . sodium chloride  3 mL Intravenous Q12H  . Tdap  0.5 mL Intramuscular Once    Assessment/Plan:  28 yo [name] with right leg numbness and weakness following delivery. Currently her right leg decreased sensation has improved but not resolved. MRI lumbar spine shows no finding to explain exam. Knee reflexes remain intact and strength on right leg remains 4/5. Patient has no visual complaints today.  Her main complain this AM is right knee pain.  MRI brain to evaluate for PRES and thoracic spine to further evaluate neuro axis.  Will continue to follow with you.     Etta Quill PA-C [facility] [phone]  Penny/26/2015, 9:05 AM

## 2014-08-01 ENCOUNTER — Encounter (HOSPITAL_COMMUNITY): Payer: Self-pay | Admitting: *Deleted

## 2014-08-01 LAB — GLUCOSE, CAPILLARY: GLUCOSE-CAPILLARY: 93 mg/dL (ref 70–99)

## 2014-08-01 LAB — RPR

## 2014-08-01 MED ORDER — OXYCODONE-ACETAMINOPHEN 5-325 MG PO TABS: 1.0000 | ORAL_TABLET | ORAL | Status: DC | PRN

## 2014-08-01 MED ORDER — PROMETHAZINE HCL 25 MG/ML IJ SOLN
12.5000 mg | Freq: Once | INTRAMUSCULAR | Status: AC
Start: 2014-08-01 — End: 2014-08-01
  Administered 2014-08-01: 12.5 mg via INTRAVENOUS
  Filled 2014-08-01: qty 1

## 2014-08-01 MED ORDER — CLONAZEPAM 0.5 MG PO TABS: 0.5000 mg | ORAL_TABLET | Freq: Two times a day (BID) | ORAL | Status: DC | PRN

## 2014-08-01 MED ORDER — AMLODIPINE BESYLATE 10 MG PO TABS: 10.0000 mg | ORAL_TABLET | Freq: Every day | ORAL | Status: DC

## 2014-08-01 MED ORDER — KETOROLAC TROMETHAMINE 10 MG PO TABS: 10.0000 mg | ORAL_TABLET | Freq: Four times a day (QID) | ORAL | Status: DC | PRN

## 2014-08-01 MED ORDER — SODIUM CHLORIDE 0.9 % IV SOLN
25.0000 mg | INTRAVENOUS | Status: DC | PRN
  Administered 2014-08-01: 25 mg via INTRAVENOUS
  Filled 2014-08-01: qty 0.5

## 2014-08-01 MED ORDER — ESCITALOPRAM OXALATE 20 MG PO TABS: 20.0000 mg | ORAL_TABLET | Freq: Every day | ORAL | Status: DC

## 2014-08-01 NOTE — Progress Notes (Signed)
Physical Therapy Treatment Patient Details Name: Penny [name] MRN: [id number] DOB: Aug 27, 1986 Today's Date: 08-09-2014    History of Present Illness Pt adm with preclampsia and delivered baby vaginally on 07/30/14. Pt with rt leg numbness, weakness, and pain. MRI of brain, L-spine, and T-spine all neg.    PT Comments    Pt doing well with rolling walker. Discussed home ex's and progression of gait including progressing to no assistive device. Ready for dc home from PT standpoint.  Follow Up Recommendations  No PT follow up     Equipment Recommendations  Rolling walker with 5" wheels    Recommendations for Other Services       Precautions / Restrictions      Mobility  Bed Mobility Overal bed mobility: Modified Independent Bed Mobility: Supine to Sit;Sit to Supine     Supine to sit: Modified independent (Device/Increase time) Sit to supine: Modified independent (Device/Increase time)      Transfers Overall transfer level: Modified independent Equipment used: Rolling walker (2 wheeled) Transfers: Sit to/from Stand Sit to Stand: Modified independent (Device/Increase time)            Ambulation/Gait Ambulation/Gait assistance: Modified independent (Device/Increase time) Ambulation Distance (Feet): 125 Feet Assistive device: Rolling walker (2 wheeled) Gait Pattern/deviations: Step-through pattern;Decreased stance time - right Gait velocity: slow Gait velocity interpretation: Below normal speed for age/gender General Gait Details: Steady gait with no cues needed.   Stairs            Wheelchair Mobility    Modified Rankin (Stroke Patients Only)       Balance Overall balance assessment: No apparent balance deficits (not formally assessed)                                  Cognition Arousal/Alertness: Awake/alert Behavior During Therapy: WFL for tasks assessed/performed Overall Cognitive Status: Within Functional Limits for tasks  assessed                      Exercises      General Comments        Pertinent Vitals/Pain      Home Living                      Prior Function            PT Goals (current goals can now be found in the care plan section) Progress towards PT goals: Goals met/education completed, patient discharged from PT    Frequency       PT Plan Current plan remains appropriate    Co-evaluation             End of Session   Activity Tolerance: Patient tolerated treatment well Patient left: in bed;with call bell/phone within reach     Time: 0840-0857 PT Time Calculation (min): 17 min  Charges:  $Gait Training: 8-22 mins                    G Codes:      Jejuan Scala August 09, 2014, 9:07 AM  [facility]uan[facility] Marker PT [phone]

## 2014-08-01 NOTE — Progress Notes (Signed)
Subjective: Patient feel much improved, walked with PT in hallway. No longer has sensory issue.   Objective: Current vital signs: BP 155/92  Pulse 80  Temp(Src) 98.6 F (37 C) (Oral)  Resp 18  Ht 5\' 5"  (1.651 m)  Wt 106.482 kg (234 lb 12 oz)  BMI 39.06 kg/m2  SpO2 97%  LMP 11/19/2013  Breastfeeding? Unknown Vital signs in last 24 hours: Temp:  [98.2 F (36.8 C)-99.2 F (37.3 C)] 98.6 F (37 C) (08/27 0527) Pulse Rate:  [80-98] 80 (08/27 0527) Resp:  [18-20] 18 (08/27 0527) BP: (143-173)/(92-111) 155/92 mmHg (08/27 0527) SpO2:  [96 %-100 %] 97 % (08/27 0527) Weight:  [106.482 kg (234 lb 12 oz)-107.616 kg (237 lb 4 oz)] 106.482 kg (234 lb 12 oz) (08/27 0757)  Intake/Output from previous day: 08/26 0701 - 08/27 0700 In: 1121.7 [P.O.:1080] Out: 1700 [Urine:3750] Intake/Output this shift:   Nutritional status: General  Neurologic Exam: General: NAD Mental Status: Alert, oriented, thought content appropriate.  Speech fluent without evidence of aphasia.  Able to follow 3 step commands without difficulty. Cranial Nerves: II: Discs flat bilaterally; Visual fields grossly normal, pupils equal, round, reactive to light and accommodation III,IV, VI: ptosis not present, extra-ocular motions intact bilaterally V,VII: smile symmetric, facial light touch sensation normal bilaterally VIII: hearing normal bilaterally IX,X: gag reflex present XI: bilateral shoulder shrug XII: midline tongue extension without atrophy or fasciculations  Motor: Right : Upper extremity   5/5    Left:     Upper extremity   5/5  Lower extremity   5/5     Lower extremity   5/5 Tone and bulk:normal tone throughout; no atrophy noted Sensory: Pinprick and light touch intact throughout, bilaterally Deep Tendon Reflexes:  Right: Upper Extremity   Left: Upper extremity   biceps (C-5 to C-6) 2/4   biceps (C-5 to C-6) 2/4 tricep (C7) 2/4    triceps (C7) 2/4 Brachioradialis (C6) 2/4  Brachioradialis (C6)  2/4  Lower Extremity Lower Extremity  quadriceps (L-2 to L-4) 2/4   quadriceps (L-2 to L-4) 2/4 Achilles (S1) 2/4   Achilles (S1) 2/4  Plantars: Right: downgoing   Left: downgoing Cerebellar: normal finger-to-nose,  normal heel-to-shin test Gait: walked with walker without problems CV: pulses palpable throughout    Lab Results: Basic Metabolic Panel:  Recent Labs Lab 07/25/14 1915 07/27/14 1820 07/28/14 2233  NA 134* 140  --   K 4.9 4.1  --   CL 104 109  --   CO2 20 18*  --   GLUCOSE 89 76  --   BUN 8 14  --   CREATININE 0.67 0.64 0.64  CALCIUM 8.3* 8.4  --     Liver Function Tests:  Recent Labs Lab 07/27/14 1820  AST 16  ALT 10  ALKPHOS 159*  BILITOT 0.5  PROT 5.6*  ALBUMIN 2.7*   No results found for this basename: LIPASE, AMYLASE,  in the last 168 hours No results found for this basename: AMMONIA,  in the last 168 hours  CBC:  Recent Labs Lab 07/25/14 1915 07/27/14 1820 07/29/14 1125 07/30/14 0111 07/30/14 0913  WBC 9.6 9.0 8.4 10.9* 10.6*  HGB 10.1* 10.5* 10.1* 6.9* 9.1*  HCT 29.7* 32.1* 31.1* 20.7* 27.1*  MCV 93.1 93.0 94.8 94.5 90.9  PLT 202 224 171 136* 147*    Cardiac Enzymes: No results found for this basename: CKTOTAL, CKMB, CKMBINDEX, TROPONINI,  in the last 168 hours  Lipid Panel: No results found for this basename:  CHOL, TRIG, HDL, CHOLHDL, VLDL, LDLCALC,  in the last 168 hours  CBG: No results found for this basename: GLUCAP,  in the last 168 hours  Microbiology: Results for orders placed during the hospital encounter of 07/27/14  CULTURE, BETA STREP (GROUP B ONLY)     Status: None   Collection Time    07/27/14  6:45 PM      Result Value Ref Range Status   Specimen Description VAGINAL/RECTAL   Final   Special Requests NONE   Final   Culture     Final   Value: GROUP B STREP(S.AGALACTIAE)ISOLATED     Note: TESTING AGAINST S. AGALACTIAE NOT ROUTINELY PERFORMED DUE TO PREDICTABILITY OF AMP/PEN/VAN SUSCEPTIBILITY.      Performed at [facility]   Report Status 07/31/2014 FINAL   Final  GROUP B STREP BY PCR     Status: Abnormal   Collection Time    07/28/14  6:50 PM      Result Value Ref Range Status   Group B strep by PCR POSITIVE (*) NEGATIVE Final   Comment: RESULT CALLED TO, READ BACK BY AND VERIFIED WITH:     LASHLEY,K. AT 2002 ON 07/28/2014 BY HOUEGNIFIO M  OB RESULTS CONSOLE GBS     Status: None   Collection Time    07/28/14  6:50 PM      Result Value Ref Range Status   GBS Positive   Final  MRSA PCR SCREENING     Status: None   Collection Time    07/30/14  5:35 AM      Result Value Ref Range Status   MRSA by PCR NEGATIVE  NEGATIVE Final   Comment:            The GeneXpert MRSA Assay (FDA     approved for NASAL specimens     only), is one component of a     comprehensive MRSA colonization     surveillance program. It is not     intended to diagnose MRSA     infection nor to guide or     monitor treatment for     MRSA infections.    Coagulation Studies: No results found for this basename: LABPROT, INR,  in the last 72 hours  Imaging: Mr Brain Wo Contrast  07/31/2014   CLINICAL DATA:  Altered mental status.  Possible PRES.  EXAM: MRI HEAD WITHOUT CONTRAST  TECHNIQUE: Multiplanar, multiecho pulse sequences of the brain and surrounding structures were obtained without intravenous contrast.  COMPARISON:  CT head 07/27/2014.  FINDINGS: The patient was unable to remain motionless for the exam. Small or subtle lesions could be overlooked.  No evidence for acute infarction, hemorrhage, mass lesion, hydrocephalus, or extra-axial fluid. Normal cerebral volume. No white matter disease. No foci of chronic hemorrhage. Flow voids are maintained. No midline abnormality. Extracranial soft tissues unremarkable.  IMPRESSION: Negative exam within limits of evaluation for motion degradation. No acute intracranial findings are evident.   Electronically Signed   By: Rolla Flatten M.D.   On: 07/31/2014  12:16   Mr Thoracic Spine Wo Contrast  07/31/2014   CLINICAL DATA:  Right leg numbness and inability to raise the right leg since epidural injection for childbirth 2 days ago. Altered mental status.  EXAM: MRI THORACIC SPINE WITHOUT CONTRAST  TECHNIQUE: Multiplanar, multisequence MR imaging of the thoracic spine was performed. No intravenous contrast was administered.  COMPARISON:  Chest x-ray dated 08/21/2013  FINDINGS: The thoracic spinal cord is normal  with no mass lesion or myelopathy or spinal cord compression. Conus tip is at L1. The discs throughout the thoracic spine are normal. No spinal or foraminal stenosis. Osseous structures are normal except for tiny hemangiomas in the T6 and T7.  There is no epidural fluid collection.  Note is made of a small area of patchy infiltrate in the right upper lobe on image number 8 of series 9.  IMPRESSION: 1. Normal MRI of the thoracic spine. 2. Small patchy area of infiltrate in the right upper lobe.   Electronically Signed   By: Rozetta Nunnery M.D.   On: 07/31/2014 12:37   Mr Lumbar Spine Wo Contrast  07/30/2014   CLINICAL DATA:  28 year old female status post vaginal delivery with epidural anesthesia, now with lumbosacral plexopathy. Initial encounter. Possible epidural hematoma.  EXAM: MRI LUMBAR SPINE WITHOUT CONTRAST  TECHNIQUE: Multiplanar, multisequence MR imaging of the lumbar spine was performed. No intravenous contrast was administered.  COMPARISON:  CT Abdomen and Pelvis 11/13/2012.  FINDINGS: Large body habitus. Normal lumbar segmentation demonstrated on the comparison. Stable, normal vertebral height and alignment. No marrow edema or evidence of acute osseous abnormality.  Visualized lower thoracic spinal cord is normal with conus medularis at L1.  Edema in the posterior subcutaneous fat, a common finding in immobilized patient. Trace pelvic free fluid, likely physiologic. Postpartum uterus partially visible. Other visualized abdominal viscera are within  normal limits.  T11-T12:  Negative.  T12-L1:  Negative.  L1-L2:  Negative.  L2-L3:  Negative disc.  Mild facet hypertrophy, no stenosis.  L3-L4: Mild disc desiccation. Mild disc bulge. No definite disc herniation. Mild facet hypertrophy. Mild flattening of the ventral thecal sac without spinal stenosis or convincing neural impingement.  L4-L5: Disc desiccation. Minor disc bulging. Superimposed small right paracentral disc protrusion with annular fissure, best seen on series 7, image 24). Disc in proximity to the descending right L5 nerve roots, but no spinal or lateral recess stenosis. Mild facet hypertrophy. No foraminal stenosis.  L5-S1: Negative disc. Mild facet hypertrophy greater on the right. 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  No epidural process or mass in the visualized lower thoracic or lumbosacral spine.  IMPRESSION: 1. Negative for lumbar epidural hematoma. No lumbar spinal stenosis. 2. Mild L3-L4 and L4-L5 disc degeneration. Small disc herniation at the latter directed toward the right. No definite nerve root mass effect, but still this could be a source for right L5 radiculitis. 3. Intermittent mild to moderate lumbar facet hypertrophy. 4. Postpartum uterus and trace pelvic free fluid.   Electronically Signed   By: Lars Pinks M.D.   On: 07/30/2014 15:15    Medications:  Scheduled: . amLODipine  5 mg Oral Daily  . docusate sodium  100 mg Oral Daily  . escitalopram  10 mg Oral Daily  . ketorolac  30 mg Intravenous Q6H  . prenatal multivitamin  1 tablet Oral Q1200  . senna-docusate  2 tablet Oral Q24H  . sodium chloride  3 mL Intravenous Q12H  . Tdap  0.5 mL Intramuscular Once    Assessment/Plan:  28 yo F with right leg numbness and weakness following delivery. Symptoms have now resolved. Main complaint is knee pain from fall. MRI brain, thoracic and lumbar spine are all normal. Agree with continued  PT.  No further work up recommended.  Neurology will  S/O.      Etta Quill PA-C [facility] [phone]  08/01/2014, 9:18 AM

## 2014-08-01 NOTE — Progress Notes (Addendum)
13:00 - Pt back from NICU in wheelchair.  Husband called RN to room.  Husband stated that pt began twitching and going "spacey".    RN to room. Vitals obtained:   Temp:  98.6 HR: 99 RR: 16 BP: 141/81 O2SAT: 98 on room air CBG: 93  Pt answers questions slowly with clear speech. Pt appears to be sleeping, but responds to voice. Unable to stick out tongue.  Able to move all 4 extremities.  Pt c/o severe frontal headache and seeing spots in her vision.  Pt states "I don't feel right."  13:11 - Phoned Dr. Lorenso Courier, resident.  She instructed RN to call Dr. Gala Romney, attending. 13:13 - Phone Dr. Gala Romney.  She instructed RN to call the neurologist who saw pt today. 13:15 - Paged Etta Quill, PA-C.  He conferred with his attending MD and called RN back stating that further imaging was unnecessary and that neuro had no further orders.  Suggested that pt be treated with analgesic for migraine and q2hr VS and neuro checks.  He anticipates that pt's symtoms will improve and that she may still be discharged today.  All three faculty providers in house called to STAT c/s.  Will obtain orders for migraine analgesic ASAP.

## 2014-08-01 NOTE — Progress Notes (Signed)
Called by RN concerning abnormal movements on the right, slowed speech, and numbness/tingling on the right.   Filed Vitals:   08/01/14 1309  BP: 141/81  Pulse: 99  Temp: 98.6 F (37 C)  Resp: 16   Pt with her eyes closed on my entrance, difficult to get her to open her eyes. Husband states that he noted her right arm started twitching in the NICU while she was holding the baby. She got a "starry look" like she was "spaced out."  She also began talking very slowly. He notes that for the last 6 years, she's had migraines that have been associated with tingling in one extremity but she has never had both numbness and tingling in the whole right side of the body.  The patient talks very slowly.  She keeps her eyes closed most of the exam, however can open then on command. Pupils very dilated, equal, round and reactive to light. Weak grip strength b/l. Able to lift legs against gravity b/l L>R.  However, when I grip her right calf (which she's endorsed pain with since delivery), she's able to move the R leg quit well. Sensation is decreased on the R UE and face compared to the left. Facial movements symmetric to smile and forehead movements.   A/P: Spoke with Etta Quill, PA with neurology. He conferred with his attending who felt that no more work up was necessary as she's had negative MRI of the brain, thoracic, and lumbar/sacral. Felt it was more mental. Recommended we give either Depakote 500mg  IV over 15 minutes (since she isn't breast feeding) or a headache cocktail and then she'd be stable for d/c. Gave IV headache cocktail of IV Toradol, IV Benadryl, and IV phenergan (I normally use Reglan however she's had adverse reactions to this in the past).

## 2014-08-01 NOTE — Progress Notes (Signed)
Discharge teaching complete. Pt understood all instructions and did not have questions. Pt discharged home to family,

## 2014-08-01 NOTE — Progress Notes (Signed)
Post Partum Day 2 Subjective: voiding, tolerating PO and pain better controlled on IV Toradol, pt notes vision is good, some "floaters" only, no vision deficits  Muscle soreness in right leg, uses walker in the room. Will be seen by PT this morning to reassess for home care. Plan for discharge this morning.  Objective: Blood pressure 155/92, pulse 80, temperature 98.6 F (37 C), temperature source Oral, resp. rate 18, height 5\' 5"  (1.651 m), weight 237 lb 4 oz (107.616 kg), last menstrual period 11/19/2013, SpO2 97.00%, unknown if currently breastfeeding.  Physical Exam:  General: alert, cooperative, fatigued, moderately obese and morbidly obese Lochia: appropriate Uterine Fundus: nontender, difficult to assess,  Incision:  DVT Evaluation: No evidence of DVT seen on physical exam.   Recent Labs  07/30/14 0111 07/30/14 0913  HGB 6.9* 9.1*  HCT 20.7* 27.1*    Assessment/Plan: Discharge home and Contraception vasectomy   LOS: 5 days   Mertis Mosher V 08/01/2014, 7:43 AM

## 2014-08-01 NOTE — Care Management Note (Signed)
    Page 1 of 1   08/01/2014     11:48:34 AM CARE MANAGEMENT NOTE 08/01/2014  Patient:  Penny [name], Penny [name]   Account Number:  [id number]  Date Initiated:  08/01/2014  Documentation initiated by:  CRAFT,TERRI  Subjective/Objective Assessment:   28 year old female admitted 07/27/14 at 34 weeks with contractions     Action/Plan:   D/C when medically stable   Anticipated DC Date:  08/01/2014   Anticipated DC Plan:  [place]  CM consult      [facility]  [place]    DME arranged  Vassie Moselle      DME agency  [place].        Status of service:  In process, will continue to follow  Discharge Disposition:  [place]   Comments:  08/01/14, Aida Raider RNC-MNN, BSN, [phone], CM received referral for DME.  Kristen at [facility] contacted with order and confirmation received.  Per Medicaid, pt is not eligible for PT services.  Pt is aware and states she plans on doing exercises that PT gave her.  Pt is to call her MD if she does not feel like she is improving.

## 2014-08-01 NOTE — Discharge Summary (Signed)
Obstetric Discharge Summary Reason for Admission: induction of labor and preeclampsia with severe features pregnancy 35 wks   First Provider Initiated Contact with Patient 07/07/14 2158  HPI: Penny [name] is a 28 y.o. G3P1011 at [redacted]w[redacted]d who presents to maternity admissions reporting worsening lower abdominal and low back pain as well as dizziness and fatigue. The LBP had responded to Zanaflex which puts her to sleep. It got worse 2 days ago when she ran out out of the Zanaflex. She has been having diarrhea "10" BMs per day for 3 weeks. She has chronic anemia and was transfused in first trimester. The abdominal pain is diffuse but more in lower abdomen and is worse with moving.  Denies contractions, leakage of fluid or vaginal bleeding. Good fetal movement.  Pregnancy Course: FT : Please see PL below. Normal 2 hrOGTT; [name] 43rd%ile at 28.4wks   Prenatal Procedures: ultrasound Intrapartum Procedures: spontaneous vaginal delivery and episiotomy sulcus lacerations Postpartum Procedures: had paresthesias of right leg, full neuologic workup with negative MRI of spine neck and head. Complications-Operative and Postpartum: second degree perineal laceration and sulcus lacerations. Hemoglobin  Date Value Ref Range Status  07/30/2014 9.1* 12.0 - 15.0 g/dL Final     DELTA CHECK NOTED     REPEATED TO VERIFY     POST TRANSFUSION SPECIMEN     2 UNITS 8.25.15  07/18/2014 10.5* 12.2 - 16.2 g/dL Final     HCT  Date Value Ref Range Status  07/30/2014 27.1* 36.0 - 46.0 % Final    Physical Exam:  General: alert, distracted, fatigued, morbidly obese and pale Lochia: appropriate Uterine Fundus: firm Incision:  DVT Evaluation: No evidence of DVT seen on physical exam.  Discharge Diagnoses: Term Pregnancy-delivered and Preelampsia  Discharge Information: Date: 08/01/2014 Activity: pelvic rest Diet: routine Medications: Percocet and toradol, Klonipin, Lexapro Condition: stable Instructions: refer to  practice specific booklet Discharge to: home Follow-up Information   Follow up with Penny Kind, MD In 1 week. (As needed, If symptoms worsen)    Specialties:  Obstetrics and Gynecology, Radiology   Contact information:   [place] [address] [phone]       Newborn Data: Live born female  Birth Weight: 5 lb 3.6 oz (2370 g) APGAR: 5, 8  In NICU  Penny [name] 08/01/2014, 8:02 AM

## 2014-08-01 NOTE — Discharge Instructions (Signed)
We have refilled the percocet, the klonipin, the lexapro and the toradol Use the walker as needed for ambulation. followup one week my office for BP check. And to check leg recovery

## 2014-08-01 NOTE — Progress Notes (Signed)
Patient requested pain med, upon entering room patient was asleep.  Awaken patient to give med.  During vital sign check patient sleeping but rating back, abdomen and right ankle pain 9 out of 10.  After charting patient remain asleep.

## 2014-08-02 ENCOUNTER — Telehealth: Payer: Self-pay | Admitting: Obstetrics and Gynecology

## 2014-08-02 NOTE — Telephone Encounter (Signed)
I spoke with Penny [name] at [facility] and he stated she got a 15 day supply filled on 07/22/14 given by Dr. Elonda Husky, she is still four days early on getting her Rx. I will call the pt's husband and let him know.

## 2014-08-02 NOTE — Telephone Encounter (Signed)
Pt's husband wanted to know if there was any way Dr. Glo Herring could override the Rx and get them to fill it any way. I advised the pt that even if he can override it they may have to pay out of pocket for the medication. I advised Leroy Sea that I would discuss this with Dr. Glo Herring and let him know what he says.

## 2014-08-02 NOTE — Telephone Encounter (Signed)
Pt's husband aware that [facility] will not give the override for the medication. Leroy Sea stated that they had they medication Friday and then "everything went down" on Saturday and they lost the medication and just have not found it.

## 2014-08-02 NOTE — Telephone Encounter (Signed)
Pt's husband states that she was given an Rx for Klonopin. Leroy Sea stated that the pharmacy told him that it has not been long enough since the last Rx was filled. Pt's husband aware that [facility] seeing pts and that I would call the pharmacy and find out what is going on. He verbalized understanding. Will discuss with [facility] as well.

## 2014-08-05 ENCOUNTER — Ambulatory Visit: Payer: Medicaid Other | Admitting: Obstetrics and Gynecology

## 2014-08-05 ENCOUNTER — Encounter: Payer: Self-pay | Admitting: Obstetrics & Gynecology

## 2014-08-05 ENCOUNTER — Ambulatory Visit (INDEPENDENT_AMBULATORY_CARE_PROVIDER_SITE_OTHER): Payer: Medicaid Other | Admitting: Obstetrics & Gynecology

## 2014-08-05 MED ORDER — LIDOCAINE HCL 2 % EX GEL: 1.0000 "application " | CUTANEOUS | Status: DC | PRN

## 2014-08-05 MED ORDER — RIZATRIPTAN BENZOATE 10 MG PO TABS: 10.0000 mg | ORAL_TABLET | ORAL | Status: DC | PRN

## 2014-08-05 NOTE — Progress Notes (Signed)
Patient ID: Penny [name], female   DOB: Apr 27, 1986, 28 y.o.   MRN: [id number] Blood pressure 140/100, weight 221 lb (100.245 kg), last menstrual period 11/19/2013, unknown if currently breastfeeding.  Pt is 1 week status post vaginal delivery with pp repair of bilateral sulcus lacs and MLE and succinturate lobe, had 2 units transfused as well  Perineum looks ok minimal swelling,  Right leg is getting better, not using the walker now  Has had 1 more headache since being home, right side  Will try maxalt for migraine treatment

## 2014-08-08 ENCOUNTER — Telehealth: Payer: Self-pay | Admitting: *Deleted

## 2014-08-08 ENCOUNTER — Telehealth: Payer: Self-pay | Admitting: Obstetrics & Gynecology

## 2014-08-08 MED ORDER — KETOROLAC TROMETHAMINE 10 MG PO TABS: 10.0000 mg | ORAL_TABLET | Freq: Three times a day (TID) | ORAL | Status: DC | PRN

## 2014-08-08 NOTE — Telephone Encounter (Signed)
Pt c/o vaginal bleeding with clots, cramping. Pt states cramping so intense, "feels like she is going to pass out." Pt states is taking the percocet as Dr. Glo Herring prescribed but continues to have cramping. Pt states saw Dr. Elonda Husky this week and discussed with him her husband assisting her with pain medication.  Please advise.

## 2014-08-08 NOTE — Telephone Encounter (Signed)
Duplicate message. 

## 2014-08-08 NOTE — Telephone Encounter (Signed)
Pt informed Toradol e-scribed and to apply heating to abdominal area per Dr. Elonda Husky. Pt verbalized understanding.

## 2014-08-13 ENCOUNTER — Observation Stay (HOSPITAL_COMMUNITY): Payer: Medicaid Other

## 2014-08-13 ENCOUNTER — Encounter (HOSPITAL_COMMUNITY): Payer: Self-pay | Admitting: Emergency Medicine

## 2014-08-13 ENCOUNTER — Inpatient Hospital Stay (HOSPITAL_COMMUNITY)
Admission: EM | Admit: 2014-08-13 | Discharge: 2014-08-17 | DRG: 776 | Disposition: A | Discharge status: Other Acute Inpatient Hospital | Payer: Medicaid Other | Attending: Internal Medicine | Admitting: Internal Medicine

## 2014-08-13 ENCOUNTER — Inpatient Hospital Stay (HOSPITAL_COMMUNITY): Payer: Medicaid Other

## 2014-08-13 ENCOUNTER — Emergency Department (HOSPITAL_COMMUNITY): Payer: Medicaid Other

## 2014-08-13 DIAGNOSIS — O9089 Other complications of the puerperium, not elsewhere classified: Secondary | ICD-10-CM | POA: Diagnosis present

## 2014-08-13 DIAGNOSIS — F411 Generalized anxiety disorder: Secondary | ICD-10-CM | POA: Diagnosis present

## 2014-08-13 DIAGNOSIS — Z9884 Bariatric surgery status: Secondary | ICD-10-CM | POA: Diagnosis not present

## 2014-08-13 DIAGNOSIS — R51 Headache: Secondary | ICD-10-CM

## 2014-08-13 DIAGNOSIS — F3289 Other specified depressive episodes: Secondary | ICD-10-CM | POA: Diagnosis present

## 2014-08-13 DIAGNOSIS — G9340 Encephalopathy, unspecified: Secondary | ICD-10-CM | POA: Diagnosis present

## 2014-08-13 DIAGNOSIS — F329 Major depressive disorder, single episode, unspecified: Secondary | ICD-10-CM | POA: Diagnosis present

## 2014-08-13 DIAGNOSIS — E872 Acidosis, unspecified: Secondary | ICD-10-CM | POA: Diagnosis present

## 2014-08-13 DIAGNOSIS — D649 Anemia, unspecified: Secondary | ICD-10-CM | POA: Diagnosis present

## 2014-08-13 DIAGNOSIS — O9081 Anemia of the puerperium: Secondary | ICD-10-CM | POA: Diagnosis present

## 2014-08-13 DIAGNOSIS — Z79899 Other long term (current) drug therapy: Secondary | ICD-10-CM

## 2014-08-13 DIAGNOSIS — Z8669 Personal history of other diseases of the nervous system and sense organs: Secondary | ICD-10-CM

## 2014-08-13 DIAGNOSIS — E876 Hypokalemia: Secondary | ICD-10-CM | POA: Diagnosis present

## 2014-08-13 DIAGNOSIS — D509 Iron deficiency anemia, unspecified: Secondary | ICD-10-CM | POA: Diagnosis present

## 2014-08-13 DIAGNOSIS — Z87891 Personal history of nicotine dependence: Secondary | ICD-10-CM

## 2014-08-13 DIAGNOSIS — G43109 Migraine with aura, not intractable, without status migrainosus: Secondary | ICD-10-CM | POA: Diagnosis present

## 2014-08-13 DIAGNOSIS — O99345 Other mental disorders complicating the puerperium: Secondary | ICD-10-CM | POA: Diagnosis present

## 2014-08-13 DIAGNOSIS — R4182 Altered mental status, unspecified: Secondary | ICD-10-CM | POA: Diagnosis present

## 2014-08-13 DIAGNOSIS — R519 Headache, unspecified: Secondary | ICD-10-CM | POA: Diagnosis present

## 2014-08-13 DIAGNOSIS — F53 Postpartum depression: Secondary | ICD-10-CM

## 2014-08-13 LAB — CBC WITH DIFFERENTIAL/PLATELET
BASOS ABS: 0 10*3/uL (ref 0.0–0.1)
BASOS ABS: 0 10*3/uL (ref 0.0–0.1)
BASOS PCT: 0 % (ref 0–1)
Basophils Relative: 0 % (ref 0–1)
EOS ABS: 0.1 10*3/uL (ref 0.0–0.7)
Eosinophils Absolute: 0.2 10*3/uL (ref 0.0–0.7)
Eosinophils Relative: 3 % (ref 0–5)
Eosinophils Relative: 2 % (ref 0–5)
HCT: 32 % — ABNORMAL LOW (ref 36.0–46.0)
HEMATOCRIT: 33.9 % — AB (ref 36.0–46.0)
HEMOGLOBIN: 11 g/dL — AB (ref 12.0–15.0)
Hemoglobin: 11.6 g/dL — ABNORMAL LOW (ref 12.0–15.0)
LYMPHS ABS: 2.7 10*3/uL (ref 0.7–4.0)
LYMPHS PCT: 38 % (ref 12–46)
Lymphocytes Relative: 24 % (ref 12–46)
Lymphs Abs: 1.8 10*3/uL (ref 0.7–4.0)
MCH: 30.4 pg (ref 26.0–34.0)
MCH: 30.5 pg (ref 26.0–34.0)
MCHC: 34.2 g/dL (ref 30.0–36.0)
MCHC: 34.4 g/dL (ref 30.0–36.0)
MCV: 88.7 fL (ref 78.0–100.0)
MCV: 88.6 fL (ref 78.0–100.0)
MONOS PCT: 8 % (ref 3–12)
Monocytes Absolute: 0.8 10*3/uL (ref 0.1–1.0)
Monocytes Absolute: 0.6 10*3/uL (ref 0.1–1.0)
Monocytes Relative: 11 % (ref 3–12)
NEUTROS ABS: 3.4 10*3/uL (ref 1.7–7.7)
NEUTROS PCT: 66 % (ref 43–77)
Neutro Abs: 5.2 10*3/uL (ref 1.7–7.7)
Neutrophils Relative %: 48 % (ref 43–77)
PLATELETS: 386 10*3/uL (ref 150–400)
PLATELETS: 332 10*3/uL (ref 150–400)
RBC: 3.82 MIL/uL — ABNORMAL LOW (ref 3.87–5.11)
RBC: 3.61 MIL/uL — ABNORMAL LOW (ref 3.87–5.11)
RDW: 13.2 % (ref 11.5–15.5)
RDW: 13.3 % (ref 11.5–15.5)
WBC: 7 10*3/uL (ref 4.0–10.5)
WBC: 7.8 10*3/uL (ref 4.0–10.5)

## 2014-08-13 LAB — COMPREHENSIVE METABOLIC PANEL
ALK PHOS: 112 U/L (ref 39–117)
ALT: 19 U/L (ref 0–35)
ALT: 18 U/L (ref 0–35)
ANION GAP: 17 — AB (ref 5–15)
ANION GAP: 13 (ref 5–15)
AST: 21 U/L (ref 0–37)
AST: 20 U/L (ref 0–37)
Albumin: 3.4 g/dL — ABNORMAL LOW (ref 3.5–5.2)
Albumin: 3.3 g/dL — ABNORMAL LOW (ref 3.5–5.2)
Alkaline Phosphatase: 118 U/L — ABNORMAL HIGH (ref 39–117)
BILIRUBIN TOTAL: 0.3 mg/dL (ref 0.3–1.2)
BUN: 11 mg/dL (ref 6–23)
BUN: 13 mg/dL (ref 6–23)
CO2: 17 mEq/L — ABNORMAL LOW (ref 19–32)
CO2: 20 meq/L (ref 19–32)
CREATININE: 0.44 mg/dL — AB (ref 0.50–1.10)
Calcium: 8.9 mg/dL (ref 8.4–10.5)
Calcium: 8.6 mg/dL (ref 8.4–10.5)
Chloride: 108 mEq/L (ref 96–112)
Chloride: 107 mEq/L (ref 96–112)
Creatinine, Ser: 0.43 mg/dL — ABNORMAL LOW (ref 0.50–1.10)
GFR calc Af Amer: >90 mL/min (ref >90)
GFR calc Af Amer: >90 mL/min (ref >90)
Glucose, Bld: 94 mg/dL (ref 70–99)
Glucose, Bld: 120 mg/dL — ABNORMAL HIGH (ref 70–99)
POTASSIUM: 3.9 meq/L (ref 3.7–5.3)
Potassium: 3.2 mEq/L — ABNORMAL LOW (ref 3.7–5.3)
SODIUM: 140 meq/L (ref 137–147)
Sodium: 142 mEq/L (ref 137–147)
Total Bilirubin: 0.3 mg/dL (ref 0.3–1.2)
Total Protein: 6.9 g/dL (ref 6.0–8.3)
Total Protein: 6.8 g/dL (ref 6.0–8.3)

## 2014-08-13 LAB — URINALYSIS, ROUTINE W REFLEX MICROSCOPIC
BILIRUBIN URINE: NEGATIVE
GLUCOSE, UA: NEGATIVE mg/dL
HGB URINE DIPSTICK: NEGATIVE
KETONES UR: NEGATIVE mg/dL
Leukocytes, UA: NEGATIVE
Nitrite: NEGATIVE
Protein, ur: NEGATIVE mg/dL
Specific Gravity, Urine: <1.005 — ABNORMAL LOW (ref 1.005–1.030)
UROBILINOGEN UA: 0.2 mg/dL (ref 0.0–1.0)
pH: 6 (ref 5.0–8.0)

## 2014-08-13 LAB — RAPID URINE DRUG SCREEN, HOSP PERFORMED
AMPHETAMINES: NOT DETECTED
BENZODIAZEPINES: NOT DETECTED
Barbiturates: POSITIVE — AB
Cocaine: NOT DETECTED
OPIATES: NOT DETECTED
TETRAHYDROCANNABINOL: NOT DETECTED

## 2014-08-13 LAB — AMMONIA: AMMONIA: 22 umol/L (ref 11–60)

## 2014-08-13 LAB — LACTIC ACID, PLASMA: Lactic Acid, Venous: 1.8 mmol/L (ref 0.5–2.2)

## 2014-08-13 LAB — TSH: TSH: 0.791 u[IU]/mL (ref 0.350–4.500)

## 2014-08-13 LAB — MAGNESIUM: Magnesium: 2.2 mg/dL (ref 1.5–2.5)

## 2014-08-13 MED ORDER — MORPHINE SULFATE 2 MG/ML IJ SOLN
1.0000 mg | INTRAMUSCULAR | Status: DC | PRN
  Administered 2014-08-13 (×2): 2 mg via INTRAVENOUS
  Filled 2014-08-13 (×2): qty 1

## 2014-08-13 MED ORDER — CLONAZEPAM 0.5 MG PO TABS
0.5000 mg | ORAL_TABLET | Freq: Two times a day (BID) | ORAL | Status: DC | PRN
  Administered 2014-08-14: 0.5 mg via ORAL
  Filled 2014-08-13: qty 1

## 2014-08-13 MED ORDER — HYDROMORPHONE HCL PF 1 MG/ML IJ SOLN
0.5000 mg | INTRAMUSCULAR | Status: DC | PRN
  Administered 2014-08-13: 0.5 mg via INTRAVENOUS
  Filled 2014-08-13: qty 1

## 2014-08-13 MED ORDER — PROMETHAZINE HCL 25 MG/ML IJ SOLN
12.5000 mg | Freq: Four times a day (QID) | INTRAMUSCULAR | Status: DC | PRN
  Administered 2014-08-13 – 2014-08-17 (×10): 12.5 mg via INTRAVENOUS
  Filled 2014-08-13 (×10): qty 1

## 2014-08-13 MED ORDER — AMMONIA AROMATIC IN INHA: 0.3000 mL | Freq: Once | RESPIRATORY_TRACT | Status: DC

## 2014-08-13 MED ORDER — METOCLOPRAMIDE HCL 5 MG/ML IJ SOLN: 10.0000 mg | Freq: Once | INTRAMUSCULAR | Status: DC

## 2014-08-13 MED ORDER — POTASSIUM CHLORIDE CRYS ER 20 MEQ PO TBCR
40.0000 meq | EXTENDED_RELEASE_TABLET | Freq: Once | ORAL | Status: AC
  Administered 2014-08-13: 40 meq via ORAL
  Filled 2014-08-13: qty 2

## 2014-08-13 MED ORDER — KETOROLAC TROMETHAMINE 30 MG/ML IJ SOLN
30.0000 mg | Freq: Once | INTRAMUSCULAR | Status: AC
  Administered 2014-08-13: 30 mg via INTRAVENOUS
  Filled 2014-08-13: qty 1

## 2014-08-13 MED ORDER — KETOROLAC TROMETHAMINE 30 MG/ML IJ SOLN
30.0000 mg | Freq: Three times a day (TID) | INTRAMUSCULAR | Status: DC | PRN
  Administered 2014-08-13 – 2014-08-15 (×5): 30 mg via INTRAVENOUS
  Filled 2014-08-13 (×5): qty 1

## 2014-08-13 MED ORDER — HYDROCODONE-ACETAMINOPHEN 5-325 MG PO TABS
1.0000 | ORAL_TABLET | ORAL | Status: AC | PRN
  Administered 2014-08-13 – 2014-08-14 (×2): 1 via ORAL
  Filled 2014-08-13 (×2): qty 1

## 2014-08-13 MED ORDER — SODIUM CHLORIDE 0.9 % IV SOLN
INTRAVENOUS | Status: DC
  Administered 2014-08-13 – 2014-08-17 (×4): via INTRAVENOUS

## 2014-08-13 MED ORDER — HEPARIN SODIUM (PORCINE) 5000 UNIT/ML IJ SOLN
5000.0000 [IU] | Freq: Three times a day (TID) | INTRAMUSCULAR | Status: DC
  Administered 2014-08-13 (×2): 5000 [IU] via SUBCUTANEOUS
  Filled 2014-08-13 (×2): qty 1

## 2014-08-13 MED ORDER — AMMONIA AROMATIC IN INHA
0.3000 mL | Freq: Once | RESPIRATORY_TRACT | Status: AC | PRN
  Administered 2014-08-13: 0.3 mL via RESPIRATORY_TRACT
  Filled 2014-08-13 (×4): qty 10

## 2014-08-13 NOTE — Progress Notes (Signed)
LTM EEG started 

## 2014-08-13 NOTE — Consult Note (Signed)
Reason for Consult: Recurrent migraine headache and altered mental status.  HPI:                                                                                                                                          Penny [name] is an 28 y.o. female history complicated migraine headaches, depression, irritable bowel syndrome, anxiety, gastric bypass for obesity, transferred from ED at [facility] for further evaluation of migraine headache as well as changes in mental status. Patient has had reduced level of responsiveness as well as recurrent episodes of posturing with stiffening of extremities. CT scan of her head showed no acute intracranial abnormality. Patient is 2 weeks postpartum with early delivery because of preeclampsia. Her infant is currently in the NICU at [place] [facility] had previous migraine headaches with focal motor and sensory changes, but no mental status changes more abnormalities of posturing.  Past Medical History  Diagnosis Date  . Migraines   . Interstitial cystitis   . IUD FEB 2010  . Gastritis JULY 2011  . Depression   . 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 Fatty liver   . Obesity (BMI 30-39.9) 2011 228 LBS BMI 36.8  . Iron deficiency anemia 07/23/2010  . Irritable bowel syndrome 2012 DIARRHEA    JUN 2012 TTG IgA 14.9  . Elevated liver enzymes JUL 2011ALK PHOS 111-127 AST  143-267 ALT  213-321T BILI 0.6  ALB  3.7-4.06 Jun 2011 ALK PHOS 118 AST 24 ALT 42 T BILI 0.4 ALB 3.9  . Chronic daily headache   . Ovarian cyst   . PONV (postoperative nausea and vomiting)   . ADD (attention deficit disorder)   . RLQ abdominal pain 07/18/2013  . Menorrhagia 07/18/2013  . Anxiety   . Potassium (K) deficiency   . Dysmenorrhea 09/18/2013  . Stress 09/18/2013  . Patient desires pregnancy 09/18/2013  . Pregnant 12/25/2013  . Blood transfusion without reported diagnosis   . Heart murmur   . Gastric bypass status for obesity     Past Surgical History  Procedure  Laterality Date  . Gab  2007    in [place]-POUCH 5 CM  . Cholecystectomy  2005    biliary dyskinesia  . Cath self every nite      for sodium bicarb injection (discontinued 2013)  . Colonoscopy  JUN 2012 ABD PN/DIARRHEA WITH PROPOFOL    NL COLON  . Upper gastrointestinal endoscopy  JULY 2011 NAUSEA-D125,V6, PH 25    Bx; GASTRITIS, POUCH-5 CM LONG  . Dilation and curettage of uterus    . Tonsillectomy    . Gastric bypass  06/2006  . Savory dilation  06/20/2012    [facility]:WCBJ gastritis/Ulcer in the mid jejunum  . Tonsillectomy and adenoidectomy    . Esophagogastroduodenoscopy    . Wisdom tooth extraction    . Repair  vaginal cuff N/A 07/30/2014    Procedure: REPAIR VAGINAL CUFF;  Surgeon: Mora Bellman, MD;  Location: [facility];  Service: Gynecology;  Laterality: N/A;    Family History  Problem Relation Age of Onset  . Hemochromatosis Maternal Grandmother   . Migraines Maternal Grandmother   . Cancer Maternal Grandmother   . Hypertension Father   . Diabetes Father   . Coronary artery disease Father   . Migraines Paternal Grandmother   . Cancer Mother     breast  . Hemochromatosis Mother   . Coronary artery disease Paternal Grandfather     Social History:  reports that she quit smoking about 7 months ago. Her smoking use included Cigarettes. She smoked 0.00 packs per day for 5 years. She has never used smokeless tobacco. She reports that she does not drink alcohol or use illicit drugs.  Allergies  Allergen Reactions  . Gabapentin Other (See Comments)    Pt states that she was unresponsive after taking this medication, but her vitals remained stable.    . Metoclopramide Hcl Anxiety and Other (See Comments)    Pt states that she felt like she was trapped in a box, and could not get out.  Pt also states that she had temporary loss of movement, weakness, and tingling.    . Topamax [Topiramate] Shortness Of Breath and Other (See Comments)    Adverse side effects.  Pt states this  medication changed her taste for a short period of time and caused numbness and tingling in extremities.    . Codeine Nausea And Vomiting  . Latex Rash  . Tape Itching and Rash    MEDICATIONS:                                                                                                                     I have reviewed the patient's current medications.   ROS:                                                                                                                                       History obtained from spouse  General ROS: negative for - chills, fatigue, fever, night sweats, weight gain or weight loss Psychological ROS: negative for - behavioral disorder, hallucinations, memory difficulties, mood swings or suicidal ideation Ophthalmic ROS: negative for - blurry vision, double vision, eye pain or loss of vision ENT ROS: negative for - epistaxis,  nasal discharge, oral lesions, sore throat, tinnitus or vertigo Allergy and Immunology ROS: negative for - hives or itchy/watery eyes Hematological and Lymphatic ROS: negative for - bleeding problems, bruising or swollen lymph nodes Endocrine ROS: negative for - galactorrhea, hair pattern changes, polydipsia/polyuria or temperature intolerance Respiratory ROS: negative for - cough, hemoptysis, shortness of breath or wheezing Cardiovascular ROS: negative for - chest pain, dyspnea on exertion, edema or irregular heartbeat Gastrointestinal ROS: negative for - abdominal pain, diarrhea, hematemesis, nausea/vomiting or stool incontinence Genito-Urinary ROS: negative for - dysuria, hematuria, incontinence or urinary frequency/urgency Musculoskeletal ROS: negative for - joint swelling or muscular weakness Neurological ROS: as noted in HPI Dermatological ROS: negative for rash and skin lesion changes   Blood pressure 122/90, pulse 108, temperature 98.3 F (36.8 C), temperature source Rectal, resp. rate 23, height $RemoveBe'5\' 7"'[id number]$  (1.702 m), weight  99.791 kg (220 lb), last menstrual period 11/19/2013, SpO2 98.00%, unknown if currently breastfeeding.   Neurologic Examination:                                                                                                      Mental Status: Alert, oriented, depressed affect with very slow speech as well as slow movements.  Speech slow and deliberate without evidence of aphasia. Able to follow commands. Cranial Nerves: II-Visual fields were normal. III/IV/VI-Pupils were equal and reacted. Extraocular movements were full and conjugate.    V/VII-reduced perception of tactile sensation over left side of the face compared to the right; no facial weakness. VIII-normal. X-no dysarthria; symmetrical palatal movement. Motor: 5/5 bilaterally with normal tone and bulk Sensory: Reduced perception of tactile sensation over right upper extremity compared to left; no numbness involving right lower extremity. Deep Tendon Reflexes: 2+ and symmetric. Plantars: Flexor bilaterally Cerebellar: Normal finger-to-nose testing.  Lab Results  Component Value Date/Time   CHOL 95 03/10/2012  6:42 AM    Results for orders placed during the hospital encounter of 08/13/14 (from the past 48 hour(s))  COMPREHENSIVE METABOLIC PANEL     Status: Abnormal   Collection Time    08/13/14  1:09 AM      Result Value Ref Range   Sodium 142  137 - 147 mEq/L   Potassium 3.2 (*) 3.7 - 5.3 mEq/L   Chloride 108  96 - 112 mEq/L   CO2 17 (*) 19 - 32 mEq/L   Glucose, Bld 94  70 - 99 mg/dL   BUN 11  6 - 23 mg/dL   Creatinine, Ser 0.44 (*) 0.50 - 1.10 mg/dL   Calcium 8.9  8.4 - 10.5 mg/dL   Total Protein 6.9  6.0 - 8.3 g/dL   Albumin 3.4 (*) 3.5 - 5.2 g/dL   AST 21  0 - 37 U/L   ALT 19  0 - 35 U/L   Alkaline Phosphatase 118 (*) 39 - 117 U/L   Total Bilirubin 0.3  0.3 - 1.2 mg/dL   GFR calc non Af Amer >90  >90 mL/min   GFR calc Af Amer >90  >90 mL/min   Comment: (NOTE)  The eGFR has been calculated using the CKD EPI  equation.     This calculation has not been validated in all clinical situations.     eGFR's persistently <90 mL/min signify possible Chronic Kidney     Disease.   Anion gap 17 (*) 5 - 15  CBC WITH DIFFERENTIAL     Status: Abnormal   Collection Time    08/13/14  1:09 AM      Result Value Ref Range   WBC 7.0  4.0 - 10.5 K/uL   RBC 3.82 (*) 3.87 - 5.11 MIL/uL   Hemoglobin 11.6 (*) 12.0 - 15.0 g/dL   HCT 33.9 (*) 36.0 - 46.0 %   MCV 88.7  78.0 - 100.0 fL   MCH 30.4  26.0 - 34.0 pg   MCHC 34.2  30.0 - 36.0 g/dL   RDW 13.2  11.5 - 15.5 %   Platelets 386  150 - 400 K/uL   Neutrophils Relative % 48  43 - 77 %   Neutro Abs 3.4  1.7 - 7.7 K/uL   Lymphocytes Relative 38  12 - 46 %   Lymphs Abs 2.7  0.7 - 4.0 K/uL   Monocytes Relative 11  3 - 12 %   Monocytes Absolute 0.8  0.1 - 1.0 K/uL   Eosinophils Relative 3  0 - 5 %   Eosinophils Absolute 0.2  0.0 - 0.7 K/uL   Basophils Relative 0  0 - 1 %   Basophils Absolute 0.0  0.0 - 0.1 K/uL  URINE RAPID DRUG SCREEN (HOSP PERFORMED)     Status: Abnormal   Collection Time    08/13/14  1:49 AM      Result Value Ref Range   Opiates NONE DETECTED  NONE DETECTED   Cocaine NONE DETECTED  NONE DETECTED   Benzodiazepines NONE DETECTED  NONE DETECTED   Amphetamines NONE DETECTED  NONE DETECTED   Tetrahydrocannabinol NONE DETECTED  NONE DETECTED   Barbiturates POSITIVE (*) NONE DETECTED   Comment:            DRUG SCREEN FOR MEDICAL PURPOSES     ONLY.  IF CONFIRMATION IS NEEDED     FOR ANY PURPOSE, NOTIFY LAB     WITHIN 5 DAYS.                LOWEST DETECTABLE LIMITS     FOR URINE DRUG SCREEN     Drug Class       Cutoff (ng/mL)     Amphetamine      1000     Barbiturate      200     Benzodiazepine   675     Tricyclics       916     Opiates          300     Cocaine          300     THC              50  URINALYSIS, ROUTINE W REFLEX MICROSCOPIC     Status: Abnormal   Collection Time    08/13/14  1:49 AM      Result Value Ref Range   Color,  Urine YELLOW  YELLOW   APPearance CLEAR  CLEAR   Specific Gravity, Urine <1.005 (*) 1.005 - 1.030   pH 6.0  5.0 - 8.0   Glucose, UA NEGATIVE  NEGATIVE mg/dL   Hgb urine dipstick NEGATIVE  NEGATIVE  Bilirubin Urine NEGATIVE  NEGATIVE   Ketones, ur NEGATIVE  NEGATIVE mg/dL   Protein, ur NEGATIVE  NEGATIVE mg/dL   Urobilinogen, UA 0.2  0.0 - 1.0 mg/dL   Nitrite NEGATIVE  NEGATIVE   Leukocytes, UA NEGATIVE  NEGATIVE   Comment: MICROSCOPIC NOT DONE ON URINES WITH NEGATIVE PROTEIN, BLOOD, LEUKOCYTES, NITRITE, OR GLUCOSE <1000 mg/dL.  MAGNESIUM     Status: None   Collection Time    08/13/14  2:54 AM      Result Value Ref Range   Magnesium 2.2  1.5 - 2.5 mg/dL  COMPREHENSIVE METABOLIC PANEL     Status: Abnormal   Collection Time    08/13/14  3:07 AM      Result Value Ref Range   Sodium 140  137 - 147 mEq/L   Potassium 3.9  3.7 - 5.3 mEq/L   Comment: DELTA CHECK NOTED   Chloride 107  96 - 112 mEq/L   CO2 20  19 - 32 mEq/L   Glucose, Bld 120 (*) 70 - 99 mg/dL   BUN 13  6 - 23 mg/dL   Creatinine, Ser 0.43 (*) 0.50 - 1.10 mg/dL   Calcium 8.6  8.4 - 10.5 mg/dL   Total Protein 6.8  6.0 - 8.3 g/dL   Albumin 3.3 (*) 3.5 - 5.2 g/dL   AST 20  0 - 37 U/L   ALT 18  0 - 35 U/L   Alkaline Phosphatase 112  39 - 117 U/L   Total Bilirubin 0.3  0.3 - 1.2 mg/dL   GFR calc non Af Amer >90  >90 mL/min   GFR calc Af Amer >90  >90 mL/min   Comment: (NOTE)     The eGFR has been calculated using the CKD EPI equation.     This calculation has not been validated in all clinical situations.     eGFR's persistently <90 mL/min signify possible Chronic Kidney     Disease.   Anion gap 13  5 - 15  CBC WITH DIFFERENTIAL     Status: Abnormal   Collection Time    08/13/14  3:07 AM      Result Value Ref Range   WBC 7.8  4.0 - 10.5 K/uL   RBC 3.61 (*) 3.87 - 5.11 MIL/uL   Hemoglobin 11.0 (*) 12.0 - 15.0 g/dL   HCT 32.0 (*) 36.0 - 46.0 %   MCV 88.6  78.0 - 100.0 fL   MCH 30.5  26.0 - 34.0 pg   MCHC 34.4   30.0 - 36.0 g/dL   RDW 13.3  11.5 - 15.5 %   Platelets 332  150 - 400 K/uL   Neutrophils Relative % 66  43 - 77 %   Neutro Abs 5.2  1.7 - 7.7 K/uL   Lymphocytes Relative 24  12 - 46 %   Lymphs Abs 1.8  0.7 - 4.0 K/uL   Monocytes Relative 8  3 - 12 %   Monocytes Absolute 0.6  0.1 - 1.0 K/uL   Eosinophils Relative 2  0 - 5 %   Eosinophils Absolute 0.1  0.0 - 0.7 K/uL   Basophils Relative 0  0 - 1 %   Basophils Absolute 0.0  0.0 - 0.1 K/uL  AMMONIA     Status: None   Collection Time    08/13/14  3:07 AM      Result Value Ref Range   Ammonia 22  11 - 60 umol/L  LACTIC ACID, PLASMA  Status: None   Collection Time    08/13/14  3:07 AM      Result Value Ref Range   Lactic Acid, Venous 1.8  0.5 - 2.2 mmol/L    Ct Head Wo Contrast  08/13/2014   CLINICAL DATA:  Loss of consciousness  EXAM: CT HEAD WITHOUT CONTRAST  TECHNIQUE: Contiguous axial images were obtained from the base of the skull through the vertex without intravenous contrast.  COMPARISON:  Prior MRI from 07/31/2014  FINDINGS: There is no acute intracranial hemorrhage or infarct. No mass lesion or midline shift. Gray-white matter differentiation is well maintained. Ventricles are normal in size without evidence of hydrocephalus. CSF containing spaces are within normal limits. No extra-axial fluid collection.  The calvarium is intact.  Orbital soft tissues are within normal limits.  Minimal opacity noted within the partially visualized right maxillary sinus. Otherwise, the paranasal sinuses and mastoid air cells are well pneumatized and free of fluid.  Scalp soft tissues are unremarkable.  IMPRESSION: Negative head CT.  No acute intracranial abnormality identified.   Electronically Signed   By: Jeannine Boga M.D.   On: 08/13/2014 01:51    Assessment/Plan: 28 year old lady with a history of complicated migraine headaches presenting with current migraine type headache with mental status changes and episodes of posturing of  extremities of unclear etiology. Psychophysiologic factors are suspected as a contributors to patient's presenting symptomatology. No clear objective neurologic deficits were noted on exam.  Recommendations: 1. MRI of the brain without contrast rule out an acute ischemic lesion 2. EEG, routine adult study 3. Analgesic medication for headache as needed  We will continue to follow this patient with you.  C.R. Nicole Kindred, MD [facility] [phone]  08/13/2014, 5:32 AM

## 2014-08-13 NOTE — H&P (Addendum)
Hospitalist Admission History and Physical  Patient name: Penny [name] Medical record number: [id number] Date of birth: 10-Jan-1986 Age: 28 y.o. Gender: female  Primary Care Provider: [name], MD  Chief Complaint: encephalopathy, headache  History of Present Illness:This is a 28 y.o. year old female with significant past medical history of preeclampsia, anxiety and depression, migranous disease  presenting with encephalopathy, headache. Level V caveat as pt is acutely altered. Primary history from husband who is EMT. Patient had a fairly complicated recent medical course including delivery at 35 weeks secondary to preeclampsia. Patient is noted to have complex migraines while at [facility] for preeclampsia. Had severe headache associated frequency this seemed to improve with IV magnesium. Had a neurological evaluation including MRI of brain and thoracic spine that were essentially negative. Has been on multiple medications at home including Toradol, Fioricet, Maxalt. Husband states the patient has had a couple complex migraines at home over the past 2 weeks. However, patient had worsening migraines over the past 24 hours with noted questionable posturing at home. Husband is unsure if patient loss consciousness although patient does not remember. No bowel or bladder anesthesia. No tonic-clonic movements. Presented to the ER with symptoms today. Afebrile. Hemodynamically stable. husband does report patient being under stress as pt's  daughters in NICU. No HI/SI. Afebrile, hemodynamically stable. Mildly tachycardic into the 100s. Satting greater than 99% on room air. Urinalysis negative for protein. Hemoglobin 11.6, potassium 3.2, bicarbonate 17. Head CT within normal limits. UDS positive for barbiturates.  Assessment and Plan: Penny [name] is a 28 y.o. year old female presenting with encephalopathy, headache Active Problems:   Encephalopathy   1-encephalopathy/headache -Broad  differential for symptoms including complex migraine, seizure disorder -Clinically without recurrence of preeclampsia. No proteinuria, anemia improved, LFTs within normal limits, platelets within normal limits. -? Psychogenic components -Hold all neurotropic meds overnight -[place] to [employer] as patient's daughter is still in the NICU with [facility] -Neuro consult on arrival -EEG -Ammonia level  2-anemia -Improved hemoglobin in postpartum state -Continue to follow -Anemia panel  3-Metabolic Acidosis  -Bicarbonate 17 -Anion gap of 17 as well  -Check lactate, salicylate, ethanol, methanol level -No ketones in urine -Hydrate and reassess   FEN/GI: N.p.o. Prophylaxis: Subcutaneous heparin Disposition: Pending further evaluation Code Status: Full code   Patient Active Problem List   Diagnosis Date Noted  . Encephalopathy 08/13/2014  . Laceration of vaginal wall or sulcus without perineal laceration during delivery 08/05/2014  . Right leg numbness 07/31/2014  . Hypertension affecting pregnancy 07/09/2014  . Gestational hypertension 07/08/2014  . Previous gastric bypass affecting pregnancy, antepartum 05/22/2014  . 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 Depression complicating pregnancy in second trimester, antepartum 05/09/2014  . Nuchal fold thickening on prental ultrasound 03/09/2014  . Supervision of other high-risk pregnancy(V23.89) 02/18/2014  . Rapid palpitations 02/18/2014  . H/O maternal third degree perineal laceration, currently pregnant 02/18/2014  . Maternal iron deficiency anemia complicating pregnancy 61/68/3729  . Restless legs 01/16/2014  . Chronic interstitial cystitis 10/23/2013  . Menorrhagia 07/18/2013  . h/o Opiate addiction 03/11/2012    Class: Acute  . Passive suicidal ideations 03/10/2012    Class: Acute  . History of migraine headaches 03/10/2012    Class: Acute  . Depression with anxiety 03/10/2012    Class:  Chronic  . 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 IBS (irritable bowel syndrome) 08/25/2011  . Diarrhea 05/27/2011  . OBESITY, UNSPECIFIED 09/17/2010  . LIVER FUNCTION TESTS, ABNORMAL, HX OF 09/17/2010  . Iron deficiency anemia 07/23/2010   Past Medical History: Past Medical History  Diagnosis Date  . Migraines   . Interstitial cystitis   . IUD FEB 2010  . Gastritis JULY 2011  . Depression   . 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 Fatty liver   . Obesity (BMI 30-39.9) 2011 228 LBS BMI 36.8  . Iron deficiency anemia 07/23/2010  . Irritable bowel syndrome 2012 DIARRHEA    JUN 2012 TTG IgA 14.9  . Elevated liver enzymes JUL 2011ALK PHOS 111-127 AST  143-267 ALT  213-321T BILI 0.6  ALB  3.7-4.06 Jun 2011 ALK PHOS 118 AST 24 ALT 42 T BILI 0.4 ALB 3.9  . Chronic daily headache   . Ovarian cyst   . PONV (postoperative nausea and vomiting)   . ADD (attention deficit disorder)   . RLQ abdominal pain 07/18/2013  . Menorrhagia 07/18/2013  . Anxiety   . Potassium (K) deficiency   . Dysmenorrhea 09/18/2013  . Stress 09/18/2013  . Patient desires pregnancy 09/18/2013  . Pregnant 12/25/2013  . Blood transfusion without reported diagnosis   . Heart murmur   . Gastric bypass status for obesity     Past Surgical History: Past Surgical History  Procedure Laterality Date  . Gab  2007    in [place]-POUCH 5 CM  . Cholecystectomy  2005    biliary dyskinesia  . Cath self every nite      for sodium bicarb injection (discontinued 2013)  . Colonoscopy  JUN 2012 ABD PN/DIARRHEA WITH PROPOFOL    NL COLON  . Upper gastrointestinal endoscopy  JULY 2011 NAUSEA-D125,V6, PH 25    Bx; GASTRITIS, POUCH-5 CM LONG  . Dilation and curettage of uterus    . Tonsillectomy    . Gastric bypass  06/2006  . Savory dilation  06/20/2012    LPF:XTKW  gastritis/Ulcer in the mid jejunum  . Tonsillectomy and adenoidectomy    . Esophagogastroduodenoscopy    . Wisdom tooth extraction    . Repair vaginal cuff N/A 07/30/2014    Procedure: REPAIR VAGINAL CUFF;  Surgeon: Mora Bellman, MD;  Location: [facility];  Service: Gynecology;  Laterality: N/A;    Social History: History   Social History  . Marital Status: Married    Spouse Name: N/A    Number of Children: 1  . Years of Education: N/A   Occupational History  .     Social History Main Topics  . Smoking status: Former Smoker -- 5 years    Types: Cigarettes    Quit date: 01/07/2014  . Smokeless tobacco: Never Used  . Alcohol Use: No  . Drug Use: No  . Sexual Activity: Not Currently    Birth Control/ Protection: None     Comment: pregnancy   Other Topics Concern  . None   Social History Narrative  . None    Family History: Family History  Problem Relation Age of Onset  . Hemochromatosis Maternal Grandmother   . Migraines Maternal Grandmother   . Cancer Maternal Grandmother   . Hypertension Father   . Diabetes Father   . Coronary artery disease Father   . Migraines Paternal Grandmother   . Cancer Mother     breast  . Hemochromatosis Mother   . Coronary artery  disease Paternal Grandfather     Allergies: Allergies  Allergen Reactions  . Gabapentin Other (See Comments)    Pt states that she was unresponsive after taking this medication, but her vitals remained stable.    . Metoclopramide Hcl Anxiety and Other (See Comments)    Pt states that she felt like she was trapped in a box, and could not get out.  Pt also states that she had temporary loss of movement, weakness, and tingling.    . Topamax [Topiramate] Shortness Of Breath and Other (See Comments)    Adverse side effects.  Pt states this medication changed her taste for a short period of time and caused numbness and tingling in extremities.    . Codeine Nausea And Vomiting  . Latex Rash  . Tape Itching and  Rash    Current Facility-Administered Medications  Medication Dose Route Frequency Provider Last Rate Last Dose  . 0.9 %  sodium chloride infusion   Intravenous Continuous Shanda Howells, MD      . heparin injection 5,000 Units  5,000 Units Subcutaneous 3 times per day Shanda Howells, MD       Current Outpatient Prescriptions  Medication Sig Dispense Refill  . acetaminophen (TYLENOL) 500 MG tablet Take 1,000 mg by mouth every 6 (six) hours as needed for pain.      Marland Kitchen amLODipine (NORVASC) 10 MG tablet Take 1 tablet (10 mg total) by mouth daily.  30 tablet  1  . Ascorbic Acid (VITAMIN C) 100 MG CHEW Chew 2 tablets by mouth daily.      Marland Kitchen BIOTIN PO Take 1 tablet by mouth daily.       . butalbital-acetaminophen-caffeine (FIORICET, ESGIC) 50-325-40 MG per tablet Take 1-2 tablets by mouth every 6 (six) hours as needed for headache.      . calcium carbonate (TUMS - DOSED IN MG ELEMENTAL CALCIUM) 500 MG chewable tablet Chew 3 tablets by mouth daily as needed for indigestion or heartburn.      . Cholecalciferol (VITAMIN D3) 2000 UNITS TABS Take 1 tablet by mouth daily.      . clonazePAM (KLONOPIN) 0.5 MG tablet Take 1 tablet (0.5 mg total) by mouth 2 (two) times daily as needed for anxiety.  30 tablet  1  . clonazePAM (KLONOPIN) 0.5 MG tablet Take 1 tablet (0.5 mg total) by mouth 2 (two) times daily as needed (anxiety).  30 tablet  2  . escitalopram (LEXAPRO) 20 MG tablet Take 1 tablet (20 mg total) by mouth daily.  30 tablet  11  . escitalopram (LEXAPRO) 20 MG tablet Take 1 tablet (20 mg total) by mouth daily.  30 tablet  3  . ketorolac (TORADOL) 10 MG tablet Take 1 tablet (10 mg total) by mouth every 6 (six) hours as needed.  20 tablet  0  . ketorolac (TORADOL) 10 MG tablet Take 1 tablet (10 mg total) by mouth every 8 (eight) hours as needed.  15 tablet  0  . labetalol (NORMODYNE) 100 MG tablet Take 1 tablet (100 mg total) by mouth 2 (two) times daily.  60 tablet  11  . lidocaine (XYLOCAINE JELLY) 2 %  jelly Apply 1 application topically as needed.  30 mL  2  . Multiple Vitamins-Minerals (ZINC PO) Take 1 tablet by mouth daily.       . nortriptyline (PAMELOR) 10 MG capsule Take 10 mg by mouth at bedtime.      Marland Kitchen nystatin (MYCOSTATIN) 100000 UNIT/ML suspension TAKE 5 MLS BY MOUTH  FOUR TIMES DAILY.  140 mL  3  . oxyCODONE-acetaminophen (PERCOCET/ROXICET) 5-325 MG per tablet Take 1-2 tablets by mouth every 4 (four) hours as needed for moderate pain or severe pain.  30 tablet  0  . Prenatal Vit-Fe Fumarate-FA (PRENATAL MULTIVITAMIN) TABS Take 1 tablet by mouth at bedtime.       . promethazine (PHENERGAN) 25 MG tablet TAKE ONE TABLET BY MOUTH EVERY 6 HOURS AS NEEDED FOR NAUSEA.  22 tablet  1  . rizatriptan (MAXALT) 10 MG tablet Take 1 tablet (10 mg total) by mouth as needed for migraine. May repeat in 2 hours if needed  10 tablet  0  . zolpidem (AMBIEN) 10 MG tablet Take 1 tablet (10 mg total) by mouth at bedtime as needed for sleep.  15 tablet  3   Review Of Systems: 12 point ROS negative except as noted above in HPI.  Physical Exam: Filed Vitals:   08/13/14 0211  BP:   Pulse:   Temp: 98.3 F (36.8 C)  Resp:     General: Altered mentation, slowed mentation with very little speech HEENT: Extraocular movements very fixed okay moves sluggishly, mydriatic pupils mildly responsive to light-at baseline per her husband Heart: S1, S2 normal, no murmur, rub or gallop, regular rate and rhythm Lungs: clear to auscultation, no wheezes or rales and unlabored breathing Abdomen: abdomen is soft without significant tenderness, masses, organomegaly or guarding Extremities: Decrease movements generally, right leg pain Skin:no rashes Neurology: Slowed mentation, minimally cooperative to exam  Labs and Imaging: Lab Results  Component Value Date/Time   NA 142 08/13/2014  1:09 AM   K 3.2* 08/13/2014  1:09 AM   CL 108 08/13/2014  1:09 AM   CO2 17* 08/13/2014  1:09 AM   BUN 11 08/13/2014  1:09 AM   CREATININE 0.44*  08/13/2014  1:09 AM   CREATININE 0.64 07/28/2014 10:33 PM   CREATININE 0.83 11/16/2013  9:33 AM   GLUCOSE 94 08/13/2014  1:09 AM   Lab Results  Component Value Date   WBC 7.0 08/13/2014   HGB 11.6* 08/13/2014   HCT 33.9* 08/13/2014   MCV 88.7 08/13/2014   PLT 386 08/13/2014   Urinalysis    Component Value Date/Time   COLORURINE YELLOW 08/13/2014 0149   APPEARANCEUR CLEAR 08/13/2014 0149   LABSPEC <1.005* 08/13/2014 0149   PHURINE 6.0 08/13/2014 0149   GLUCOSEU NEGATIVE 08/13/2014 0149   HGBUR NEGATIVE 08/13/2014 0149   BILIRUBINUR NEGATIVE 08/13/2014 0149   KETONESUR NEGATIVE 08/13/2014 0149   PROTEINUR NEGATIVE 08/13/2014 0149   PROTEINUR neg 07/26/2014 1033   UROBILINOGEN 0.2 08/13/2014 0149   NITRITE NEGATIVE 08/13/2014 0149   NITRITE neg 07/26/2014 1033   LEUKOCYTESUR NEGATIVE 08/13/2014 0149       Ct Head Wo Contrast  08/13/2014   CLINICAL DATA:  Loss of consciousness  EXAM: CT HEAD WITHOUT CONTRAST  TECHNIQUE: Contiguous axial images were obtained from the base of the skull through the vertex without intravenous contrast.  COMPARISON:  Prior MRI from 07/31/2014  FINDINGS: There is no acute intracranial hemorrhage or infarct. No mass lesion or midline shift. Gray-white matter differentiation is well maintained. Ventricles are normal in size without evidence of hydrocephalus. CSF containing spaces are within normal limits. No extra-axial fluid collection.  The calvarium is intact.  Orbital soft tissues are within normal limits.  Minimal opacity noted within the partially visualized right maxillary sinus. Otherwise, the paranasal sinuses and mastoid air cells are well pneumatized and free of fluid.  Scalp soft tissues are unremarkable.  IMPRESSION: Negative head CT.  No acute intracranial abnormality identified.   Electronically Signed   By: Jeannine Boga M.D.   On: 08/13/2014 01:51           Shanda Howells MD  Pager: [phone]

## 2014-08-13 NOTE — Progress Notes (Signed)
Utilization review completed.  

## 2014-08-13 NOTE — Progress Notes (Signed)
Approximately 10 minutes after I had spoken to [place], [place] with neuro, patient began to have "an episode" as described by her fiance. I grabbed an ammonia inhalent and entered the room. Found patient slowly and rigidly posturing arms and legs, eyes closed tight, mouth open. I opened ammonia inhalent under her nose from which she immediately withdrew and eyes began to water. I opened her eyes and they responded to threat, and pupils dilated appropriately to light. Patient mildly reacted to pain at her fingernail sites. After about 2 minutes, patient began to SLOWLY speak and say "what .Marland Kitchen...is.........Marland Kitchenhappening.?" VSS throughout episode, heart rate remained unchanged, patient appeared to lack ability to move her arms or legs once her eyes began tracking me and speaking. reflexes to pain though. After about 10 minutes patient able to slowly raise her arms and legs and cry from pain.   I spoke to Dr Leonel Ramsay and Dr Allyson Sabal. Overnight EEG video monitor was ordered and pain medicines were adjusted. Will continue to monitor

## 2014-08-13 NOTE — Progress Notes (Signed)
Patient had another episode around 2340 that lasted approx. 3 minutes. When I entered the room her limbs were contracted, mouth open, and face in a blank stare. Patient did not respond to pain but did respond to the ammonia. She was able to tell me her name slowly but not the time or situation. Her husband recalls that the pt stated she could feel her head tingling and that an episode was about to occur. Following the episode she had a worsened headache, nausea and began to cry calling out for her infant. Will continue to monitor closely. Lorraina Spring, Rande Brunt

## 2014-08-13 NOTE — Progress Notes (Signed)
EEG Completed; Results Pending  

## 2014-08-13 NOTE — Progress Notes (Signed)
Dr. Leonel Ramsay was called after pt had another episode per husband.  I let the MD  know that pt had a mild response to the ammonia capsule and had  no response to pain.  Husband reported that he was able to hit the button on the EEG machine as he noticed the episode was happening.  Husband is concerned be/c of history of MS in the family.  I explained that MD had been called and that data would be trending overnight so it could be evaluated tomorrow.

## 2014-08-13 NOTE — ED Notes (Signed)
Pt recently diagnosed with complex migraines. Pt started with pain yesterday and per husband has been posturing with ?seizures. Pt was c/o numbness to one side before this episode.

## 2014-08-13 NOTE — ED Notes (Signed)
Patient VIA POV c/o episodes of posturing per husband. Patient recently diagnosed with migraines and today patient started having body stiffness and head turning to one side. Patient responds to painful stimuli by withdrawing and patient does have a positive gag reflex.

## 2014-08-13 NOTE — Progress Notes (Signed)
Patient seen and examined  Agree with Shanda Howells, MD H&P  General: Altered mentation, slowed mentation with very little speech  HEENT: Extraocular movements very fixed okay moves sluggishly, mydriatic pupils mildly responsive to light-at baseline per her husband  Heart: S1, S2 normal, no murmur, rub or gallop, regular rate and rhythm  Lungs: clear to auscultation, no wheezes or rales and unlabored breathing  Abdomen: abdomen is soft without significant tenderness, masses, organomegaly or guarding  Extremities: Decrease movements generally, right leg pain  Assessment and plan 1-encephalopathy/headache  -Broad differential for symptoms including complex migraine, seizure disorder  -Clinically without recurrence of preeclampsia. No proteinuria, anemia improved, LFTs within normal limits, platelets within normal limits.  -? Psychogenic components  -Hold all neurotropic meds overnight  Neurology has seen the patient and he recommended MRI of the brain, EEG results pending Ammonia level normal  Hypokalemia Repleted   2-anemia  -Improved hemoglobin in postpartum state  -Continue to follow  -Anemia panel    3-Metabolic Acidosis  -Bicarbonate 17  Improved -Check lactate, salicylate, ethanol, methanol level -results pending   Anticipate discharge home tomorrow

## 2014-08-13 NOTE — ED Provider Notes (Addendum)
CSN: [id number]     Arrival date & time 08/13/14  0048 History   First MD Initiated Contact with Patient 08/13/14 0055     Chief Complaint  Patient presents with  . Loss of Consciousness     (Consider location/radiation/quality/duration/timing/severity/associated sxs/prior Treatment) HPI Level V caveat altered mental status. History is obtained from patient's old records and from her husband who accompanies her. Patient awakened yesterday morning with a left-sided temporal headache typical migraines she's had in the past . She was recently started on nortriptyline for migraines. She was brought today as she had 3 episodes of diminished level of consciousness with "posturing" where she became stiff in all 4 extremities for several seconds as witnessed by her husband. She was able to ambulate to the car from her home tonight. No fever. No other associated symptoms.Her husband reports that she's been dx'd with migraines, which mimic strokes with focal numbess and weakness, and slurred speech, but has never had "posturing" in the past. Pt is s/p preterm delivery 2 weeks ago today,. Treated for pre-ecalpsia during pregnancy Past Medical History  Diagnosis Date  . Migraines   . Interstitial cystitis   . IUD FEB 2010  . Gastritis JULY 2011  . Depression   . 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 Fatty liver   . Obesity (BMI 30-39.9) 2011 228 LBS BMI 36.8  . Iron deficiency anemia 07/23/2010  . Irritable bowel syndrome 2012 DIARRHEA    JUN 2012 TTG IgA 14.9  . Elevated liver enzymes JUL 2011ALK PHOS 111-127 AST  143-267 ALT  213-321T BILI 0.6  ALB  3.7-4.06 Jun 2011 ALK PHOS 118 AST 24 ALT 42 T BILI 0.4 ALB 3.9  . Chronic daily headache   . Ovarian cyst   . PONV (postoperative nausea and vomiting)   . ADD (attention deficit disorder)   . RLQ abdominal pain 07/18/2013  . Menorrhagia 07/18/2013  . Anxiety   . Potassium (K) deficiency   . Dysmenorrhea 09/18/2013  . Stress 09/18/2013  .  Patient desires pregnancy 09/18/2013  . Pregnant 12/25/2013  . Blood transfusion without reported diagnosis   . Heart murmur   . Gastric bypass status for obesity    Past Surgical History  Procedure Laterality Date  . Gab  2007    in [place]-POUCH 5 CM  . Cholecystectomy  2005    biliary dyskinesia  . Cath self every nite      for sodium bicarb injection (discontinued 2013)  . Colonoscopy  JUN 2012 ABD PN/DIARRHEA WITH PROPOFOL    NL COLON  . Upper gastrointestinal endoscopy  JULY 2011 NAUSEA-D125,V6, PH 25    Bx; GASTRITIS, POUCH-5 CM LONG  . Dilation and curettage of uterus    . Tonsillectomy    . Gastric bypass  06/2006  . Savory dilation  06/20/2012    XLK:GMWN gastritis/Ulcer in the mid jejunum  . Tonsillectomy and adenoidectomy    . Esophagogastroduodenoscopy    . Wisdom tooth extraction    . Repair vaginal cuff N/A 07/30/2014    Procedure: REPAIR VAGINAL CUFF;  Surgeon: Mora Bellman, MD;  Location: [facility];  Service: Gynecology;  Laterality: N/A;   Family History  Problem Relation Age of Onset  . Hemochromatosis Maternal Grandmother   . Migraines Maternal Grandmother   . Cancer Maternal Grandmother   . Hypertension Father   . Diabetes Father   . Coronary artery disease Father   . Migraines Paternal  Grandmother   . Cancer Mother     breast  . Hemochromatosis Mother   . Coronary artery disease Paternal Grandfather    History  Substance Use Topics  . Smoking status: Former Smoker -- 5 years    Types: Cigarettes    Quit date: 01/07/2014  . Smokeless tobacco: Never Used  . Alcohol Use: No   OB History   Grav Para Term Preterm Abortions TAB SAB Ect Mult Living   '3 2 1 1 1  1   2     '$ Review of Systems  Unable to perform ROS: Mental status change      Allergies  Gabapentin; Metoclopramide hcl; Topamax; Codeine; Latex; and Tape  Home Medications   Prior to Admission medications   Medication Sig Start Date End Date Taking? Authorizing Provider   acetaminophen (TYLENOL) 500 MG tablet Take 1,000 mg by mouth every 6 (six) hours as needed for pain.   Yes Historical Provider, MD  amLODipine (NORVASC) 10 MG tablet Take 1 tablet (10 mg total) by mouth daily. 08/01/14  Yes Jonnie Kind, MD  Ascorbic Acid (VITAMIN C) 100 MG CHEW Chew 2 tablets by mouth daily.   Yes Historical Provider, MD  BIOTIN PO Take 1 tablet by mouth daily.    Yes Historical Provider, MD  butalbital-acetaminophen-caffeine (FIORICET, ESGIC) 50-325-40 MG per tablet Take 1-2 tablets by mouth every 6 (six) hours as needed for headache.   Yes Historical Provider, MD  calcium carbonate (TUMS - DOSED IN MG ELEMENTAL CALCIUM) 500 MG chewable tablet Chew 3 tablets by mouth daily as needed for indigestion or heartburn.   Yes Historical Provider, MD  Cholecalciferol (VITAMIN D3) 2000 UNITS TABS Take 1 tablet by mouth daily.   Yes Historical Provider, MD  clonazePAM (KLONOPIN) 0.5 MG tablet Take 1 tablet (0.5 mg total) by mouth 2 (two) times daily as needed for anxiety. 06/24/14  Yes Florian Buff, MD  clonazePAM (KLONOPIN) 0.5 MG tablet Take 1 tablet (0.5 mg total) by mouth 2 (two) times daily as needed (anxiety). 08/01/14  Yes Jonnie Kind, MD  escitalopram (LEXAPRO) 20 MG tablet Take 1 tablet (20 mg total) by mouth daily. 04/10/14  Yes Florian Buff, MD  escitalopram (LEXAPRO) 20 MG tablet Take 1 tablet (20 mg total) by mouth daily. 08/01/14  Yes Jonnie Kind, MD  ketorolac (TORADOL) 10 MG tablet Take 1 tablet (10 mg total) by mouth every 6 (six) hours as needed. 08/01/14  Yes Jonnie Kind, MD  ketorolac (TORADOL) 10 MG tablet Take 1 tablet (10 mg total) by mouth every 8 (eight) hours as needed. 08/08/14  Yes Florian Buff, MD  labetalol (NORMODYNE) 100 MG tablet Take 1 tablet (100 mg total) by mouth 2 (two) times daily. 07/12/14  Yes Florian Buff, MD  lidocaine (XYLOCAINE JELLY) 2 % jelly Apply 1 application topically as needed. 08/05/14  Yes Florian Buff, MD  Multiple  Vitamins-Minerals (ZINC PO) Take 1 tablet by mouth daily.    Yes Historical Provider, MD  nortriptyline (PAMELOR) 10 MG capsule Take 10 mg by mouth at bedtime.   Yes Historical Provider, MD  nystatin (MYCOSTATIN) 100000 UNIT/ML suspension TAKE 5 MLS BY MOUTH FOUR TIMES DAILY. 05/31/14  Yes Mikey Kirschner, MD  oxyCODONE-acetaminophen (PERCOCET/ROXICET) 5-325 MG per tablet Take 1-2 tablets by mouth every 4 (four) hours as needed for moderate pain or severe pain. 08/01/14  Yes Jonnie Kind, MD  Prenatal Vit-Fe Fumarate-FA (PRENATAL MULTIVITAMIN) TABS Take  1 tablet by mouth at bedtime.    Yes Historical Provider, MD  promethazine (PHENERGAN) 25 MG tablet TAKE ONE TABLET BY MOUTH EVERY 6 HOURS AS NEEDED FOR NAUSEA. 07/10/14  Yes Florian Buff, MD  rizatriptan (MAXALT) 10 MG tablet Take 1 tablet (10 mg total) by mouth as needed for migraine. May repeat in 2 hours if needed 08/05/14  Yes Florian Buff, MD  zolpidem (AMBIEN) 10 MG tablet Take 1 tablet (10 mg total) by mouth at bedtime as needed for sleep. 07/26/14 08/25/14 Yes Florian Buff, MD   BP 125/89  Pulse 136  Temp(Src) 98.3 F (36.8 C) (Rectal)  Resp 26  Ht $R'5\' 7"'yx$  (1.702 m)  Wt 220 lb (99.791 kg)  BMI 34.45 kg/m2  SpO2 100%  LMP 11/19/2013 Physical Exam  Nursing note and vitals reviewed. Constitutional: She appears well-developed and well-nourished.  Sleepy arousable to verbal stimulus. Complaining of left-sided temporal headache  HENT:  Head: Normocephalic and atraumatic.  Eyes: Conjunctivae are normal. Pupils are equal, round, and reactive to light.  Pupils 5 mm reactive to light bilaterally  Neck: Neck supple. No tracheal deviation present. No thyromegaly present.  Cardiovascular: Regular rhythm.   No murmur heard. Tachycardia  Pulmonary/Chest: Effort normal and breath sounds normal.  Abdominal: Soft. Bowel sounds are normal. She exhibits no distension. There is no tenderness.  Musculoskeletal: Normal range of motion. She exhibits  no edema and no tenderness.  Neurological: Coordination normal.  Sleepy arousable to verbal stimulus, speaks very slowly, moves all extremities follows simple commands. DTRs symmetric bilaterally knee jerk ankle jerk and biceps toes are normal bilaterally  Skin: Skin is warm and dry. No rash noted.  Psychiatric: She has a normal mood and affect.    ED Course  Procedures (including critical care time) Labs Review Labs Reviewed  COMPREHENSIVE METABOLIC PANEL  CBC WITH DIFFERENTIAL  URINE RAPID DRUG SCREEN (HOSP PERFORMED)    Imaging Review No results found.   EKG Interpretation   Date/Time:  Tuesday August 13 2014 00:57:23 EDT Ventricular Rate:  131 PR Interval:  122 QRS Duration: 77 QT Interval:  425 QTC Calculation: 627 R Axis:   44 Text Interpretation:  Sinus tachycardia Minimal ST depression, inferior  leads Prolonged QT interval SINCE LAST TRACING HEART RATE HAS INCREASED  Confirmed by Winfred Leeds  MD, Ward Boissonneault [phone]) on 08/13/2014 1:40:42 AM       Results for orders placed during the hospital encounter of 08/13/14  COMPREHENSIVE METABOLIC PANEL      Result Value Ref Range   Sodium 142  137 - 147 mEq/L   Potassium 3.2 (*) 3.7 - 5.3 mEq/L   Chloride 108  96 - 112 mEq/L   CO2 17 (*) 19 - 32 mEq/L   Glucose, Bld 94  70 - 99 mg/dL   BUN 11  6 - 23 mg/dL   Creatinine, Ser 0.44 (*) 0.50 - 1.10 mg/dL   Calcium 8.9  8.4 - 10.5 mg/dL   Total Protein 6.9  6.0 - 8.3 g/dL   Albumin 3.4 (*) 3.5 - 5.2 g/dL   AST 21  0 - 37 U/L   ALT 19  0 - 35 U/L   Alkaline Phosphatase 118 (*) 39 - 117 U/L   Total Bilirubin 0.3  0.3 - 1.2 mg/dL   GFR calc non Af Amer >90  >90 mL/min   GFR calc Af Amer >90  >90 mL/min   Anion gap 17 (*) 5 - 15  CBC WITH DIFFERENTIAL  Result Value Ref Range   WBC 7.0  4.0 - 10.5 K/uL   RBC 3.82 (*) 3.87 - 5.11 MIL/uL   Hemoglobin 11.6 (*) 12.0 - 15.0 g/dL   HCT 33.9 (*) 36.0 - 46.0 %   MCV 88.7  78.0 - 100.0 fL   MCH 30.4  26.0 - 34.0 pg   MCHC 34.2   30.0 - 36.0 g/dL   RDW 13.2  11.5 - 15.5 %   Platelets 386  150 - 400 K/uL   Neutrophils Relative % 48  43 - 77 %   Neutro Abs 3.4  1.7 - 7.7 K/uL   Lymphocytes Relative 38  12 - 46 %   Lymphs Abs 2.7  0.7 - 4.0 K/uL   Monocytes Relative 11  3 - 12 %   Monocytes Absolute 0.8  0.1 - 1.0 K/uL   Eosinophils Relative 3  0 - 5 %   Eosinophils Absolute 0.2  0.0 - 0.7 K/uL   Basophils Relative 0  0 - 1 %   Basophils Absolute 0.0  0.0 - 0.1 K/uL  URINE RAPID DRUG SCREEN (HOSP PERFORMED)      Result Value Ref Range   Opiates NONE DETECTED  NONE DETECTED   Cocaine NONE DETECTED  NONE DETECTED   Benzodiazepines NONE DETECTED  NONE DETECTED   Amphetamines NONE DETECTED  NONE DETECTED   Tetrahydrocannabinol NONE DETECTED  NONE DETECTED   Barbiturates POSITIVE (*) NONE DETECTED  URINALYSIS, ROUTINE W REFLEX MICROSCOPIC      Result Value Ref Range   Color, Urine YELLOW  YELLOW   APPearance CLEAR  CLEAR   Specific Gravity, Urine <1.005 (*) 1.005 - 1.030   pH 6.0  5.0 - 8.0   Glucose, UA NEGATIVE  NEGATIVE mg/dL   Hgb urine dipstick NEGATIVE  NEGATIVE   Bilirubin Urine NEGATIVE  NEGATIVE   Ketones, ur NEGATIVE  NEGATIVE mg/dL   Protein, ur NEGATIVE  NEGATIVE mg/dL   Urobilinogen, UA 0.2  0.0 - 1.0 mg/dL   Nitrite NEGATIVE  NEGATIVE   Leukocytes, UA NEGATIVE  NEGATIVE   Ct Head Wo Contrast  08/13/2014   CLINICAL DATA:  Loss of consciousness  EXAM: CT HEAD WITHOUT CONTRAST  TECHNIQUE: Contiguous axial images were obtained from the base of the skull through the vertex without intravenous contrast.  COMPARISON:  Prior MRI from 07/31/2014  FINDINGS: There is no acute intracranial hemorrhage or infarct. No mass lesion or midline shift. Gray-white matter differentiation is well maintained. Ventricles are normal in size without evidence of hydrocephalus. CSF containing spaces are within normal limits. No extra-axial fluid collection.  The calvarium is intact.  Orbital soft tissues are within normal  limits.  Minimal opacity noted within the partially visualized right maxillary sinus. Otherwise, the paranasal sinuses and mastoid air cells are well pneumatized and free of fluid.  Scalp soft tissues are unremarkable.  IMPRESSION: Negative head CT.  No acute intracranial abnormality identified.   Electronically Signed   By: Jeannine Boga M.D.   On: 08/13/2014 01:51   Ct Head Wo Contrast  07/27/2014   CLINICAL DATA:  Concern for PRES.  Pre-eclampsia.  EXAM: CT HEAD WITHOUT CONTRAST  TECHNIQUE: Contiguous axial images were obtained from the base of the skull through the vertex without intravenous contrast.  COMPARISON:  None.  FINDINGS: Skull and Sinuses:Negative for fracture or destructive process. The mastoids, middle ears, and imaged paranasal sinuses are clear.  Orbits: No acute abnormality.  Brain: No evidence of acute abnormality,  such as acute infarction, hemorrhage, hydrocephalus, or mass lesion/mass effect.  IMPRESSION: Negative head CT.   Electronically Signed   By: Jorje Guild M.D.   On: 07/27/2014 22:10   Mr Brain Wo Contrast  07/31/2014   CLINICAL DATA:  Altered mental status.  Possible PRES.  EXAM: MRI HEAD WITHOUT CONTRAST  TECHNIQUE: Multiplanar, multiecho pulse sequences of the brain and surrounding structures were obtained without intravenous contrast.  COMPARISON:  CT head 07/27/2014.  FINDINGS: The patient was unable to remain motionless for the exam. Small or subtle lesions could be overlooked.  No evidence for acute infarction, hemorrhage, mass lesion, hydrocephalus, or extra-axial fluid. Normal cerebral volume. No white matter disease. No foci of chronic hemorrhage. Flow voids are maintained. No midline abnormality. Extracranial soft tissues unremarkable.  IMPRESSION: Negative exam within limits of evaluation for motion degradation. No acute intracranial findings are evident.   Electronically Signed   By: Rolla Flatten M.D.   On: 07/31/2014 12:16   Mr Thoracic Spine Wo  Contrast  07/31/2014   CLINICAL DATA:  Right leg numbness and inability to raise the right leg since epidural injection for childbirth 2 days ago. Altered mental status.  EXAM: MRI THORACIC SPINE WITHOUT CONTRAST  TECHNIQUE: Multiplanar, multisequence MR imaging of the thoracic spine was performed. No intravenous contrast was administered.  COMPARISON:  Chest x-ray dated 08/21/2013  FINDINGS: The thoracic spinal cord is normal with no mass lesion or myelopathy or spinal cord compression. Conus tip is at L1. The discs throughout the thoracic spine are normal. No spinal or foraminal stenosis. Osseous structures are normal except for tiny hemangiomas in the T6 and T7.  There is no epidural fluid collection.  Note is made of a small area of patchy infiltrate in the right upper lobe on image number 8 of series 9.  IMPRESSION: 1. Normal MRI of the thoracic spine. 2. Small patchy area of infiltrate in the right upper lobe.   Electronically Signed   By: Rozetta Nunnery M.D.   On: 07/31/2014 12:37   Mr Lumbar Spine Wo Contrast  07/30/2014   CLINICAL DATA:  28 year old female status post vaginal delivery with epidural anesthesia, now with lumbosacral plexopathy. Initial encounter. Possible epidural hematoma.  EXAM: MRI LUMBAR SPINE WITHOUT CONTRAST  TECHNIQUE: Multiplanar, multisequence MR imaging of the lumbar spine was performed. No intravenous contrast was administered.  COMPARISON:  CT Abdomen and Pelvis 11/13/2012.  FINDINGS: Large body habitus. Normal lumbar segmentation demonstrated on the comparison. Stable, normal vertebral height and alignment. No marrow edema or evidence of acute osseous abnormality.  Visualized lower thoracic spinal cord is normal with conus medularis at L1.  Edema in the posterior subcutaneous fat, a common finding in immobilized patient. Trace pelvic free fluid, likely physiologic. Postpartum uterus partially visible. Other visualized abdominal viscera are within normal limits.  T11-T12:   Negative.  T12-L1:  Negative.  L1-L2:  Negative.  L2-L3:  Negative disc.  Mild facet hypertrophy, no stenosis.  L3-L4: Mild disc desiccation. Mild disc bulge. No definite disc herniation. Mild facet hypertrophy. Mild flattening of the ventral thecal sac without spinal stenosis or convincing neural impingement.  L4-L5: Disc desiccation. Minor disc bulging. Superimposed small right paracentral disc protrusion with annular fissure, best seen on series 7, image 24). Disc in proximity to the descending right L5 nerve roots, but no spinal or lateral recess stenosis. Mild facet hypertrophy. No foraminal stenosis.  L5-S1: Negative disc. Mild facet hypertrophy greater on the right. Epidural lipomatosis effacing CSF from the  thecal sac at this level.  No epidural process or mass in the visualized lower thoracic or lumbosacral spine.  IMPRESSION: 1. Negative for lumbar epidural hematoma. No lumbar spinal stenosis. 2. Mild L3-L4 and L4-L5 disc degeneration. Small disc herniation at the latter directed toward the right. No definite nerve root mass effect, but still this could be a source for right L5 radiculitis. 3. Intermittent mild to moderate lumbar facet hypertrophy. 4. Postpartum uterus and trace pelvic free fluid.   Electronically Signed   By: Lars Pinks M.D.   On: 07/30/2014 15:15   US Ob Limited  07/14/2014   OBSTETRICAL ULTRASOUND: This exam was performed within a [facility]. The OB US report was generated in the AS system, and faxed to the ordering physician.   This report is available in the [facility]. See the AS Obstetric US report via the Image Link.  US Ob Follow Up  07/15/2014   FOLLOW UP SONOGRAM   Rinnah L Hollman is in the office for a follow up sonogram for GHTN.  She is a 28 y.o. year old G61P1011 with Estimated Date of Delivery: 09/02/14  now at  [redacted]w[redacted]d weeks gestation. Thus far the pregnancy has been complicated  by  [facility].  GESTATION: SINGLETON  PRESENTATION: cephalic  FETAL  ACTIVITY:          Heart rate         148 bpm          The fetus is active.  AMNIOTIC FLUID: The amniotic fluid volume is  normal, AFI-7.8 cm SDP-2.2cm.  PLACENTA LOCALIZATION:   GRADE 1  CERVIX: Unable to view  ADNEXA: The adnexa appears normal.   GESTATIONAL AGE AND  BIOMETRICS:  Gestational criteria: Estimated Date of Delivery: 09/02/14  now at [redacted]w[redacted]d  Previous Scans:  GESTATIONAL SAC            mm          weeks  CROWN RUMP LENGTH            mm          weeks  NUCHAL TRANSLUCENCY            mm           BIPARIETAL DIAMETER           8.4 cm         33+6 weeks  HEAD CIRCUMFERENCE           29.74 cm         32+6 weeks  ABDOMINAL CIRCUMFERENCE           27.58 cm         31+5 weeks  FEMUR LENGTH           5.94 cm         31+0 weeks                                                           AVERAGE EGA(BY THIS SCAN):   32+2 weeks  ESTIMATED FETAL WEIGHT:        1805  grams, 25 % ANATOMICAL SURVEY                                                                             COMMENTS CEREBRAL VENTRICLES yes normal   CHOROID PLEXUS yes normal   CEREBELLUM yes normal   CISTERNA MAGNA yes normal   NUCHAL REGION yes normal   ORBITS no    NASAL BONE no    NOSE/LIP no    FACIAL PROFILE no    4 CHAMBERED HEART yes normal   OUTFLOW TRACTS no    DIAPHRAGM yes normal   STOMACH yes normal   RENAL REGION no    BLADDER no    CORD INSERTION no    3 VESSEL CORD yes normal   SPINE no    ARMS/HANDS no    LEGS/FEET no    GENITALIA yes normal female        BIOPHYSCIAL PROFILE:                                                                                                       COMMENTS GROSS BODY MOVEMENT                 2   TONE                2   RESPIRATIONS                2   AMNIOTIC FLUID                2                                                            SCORE:  8/8 (Note: NST was not performed as part of this antepartum testing)   DOPPLER FLOW STUDIES: UMBILICAL ARTERY RI RATIOS:    0.74, 0.71    SUSPECTED ABNORMALITIES:  no  QUALITY OF SCAN: satisfactory  TECHNICIAN COMMENTS:  U/S(33+0wks)-vtx active fetus, EFW 4 lb (25th%tile), fluid WNL AFI-7.8cm  SDp-2.2cm, FHR-148 bpm, UA Doppler RI-0.74 & 0.71, BPP 8/8, Gr 1 placenta  (succ lobe noted)   A copy of this report including all images has been saved and backed up to  a second source for retrieval if needed. All measures and details of the  anatomical scan, placentation, fluid volume and pelvic anatomy are  contained in that report.  Chari Manning 07/15/2014 11:38 AM  Clinical Impression and recommendations:  I have reviewed the sonogram results above, combined with the  patient's  current clinical course, below are my impressions and any appropriate  recommendations for management based on the sonographic findings.   1.  [id number] Estimated Date of Delivery: 09/02/14 by  early ultrasound,  midtrimester ultrasound and confirmed by today's sonographic dating 2.  Normal fetal sonographic findings, normal fluid, normal Doppler flow  and normal fetal surveillance 3.  Normal general sonographic findings  Recommend routine prenatal care based on this sonogram or as clinically  indicated  EURE,LUTHER H 07/15/2014 12:00 PM        US Fetal Bpp W/o Non Stress  07/26/2014   OBSTETRICAL ULTRASOUND: This exam was performed within a [place] [facility]. The OB US report was generated in the AS system, and faxed to the ordering physician.   This report is available in the [facility]. See the AS Obstetric US report via the Image Link.  US Fetal Bpp W/o Non Stress  07/23/2014   FOLLOW UP SONOGRAM   Gunhild L Mckell is in the office for a follow up sonogram for BPP/AFI/UAD.  She is a 28 y.o. year old G110P1011 with Estimated Date of Delivery: 09/02/14  now at  [redacted]w[redacted]d weeks gestation. Thus far the pregnancy has been complicated  by [facility].  GESTATION: SINGLETON  PRESENTATION: cephalic  FETAL ACTIVITY:          Heart rate         163 bpm          The fetus  is active.  AMNIOTIC FLUID: The amniotic fluid volume is  normal, 13.7 cm SDP-5.2cm.  PLACENTA LOCALIZATION:  anterior with succ lobe posterior GRADE 2  CERVIX: N/A  ADNEXA: Adnexa appears WNL  GESTATIONAL AGE AND  BIOMETRICS:  Gestational criteria: Estimated Date of Delivery: 09/02/14  now at [redacted]w[redacted]d   BIOPHYSCIAL PROFILE:                                                                                                       [place]                 2   TONE                2   RESPIRATIONS                2   AMNIOTIC FLUID                2                                                            SCORE:  8/8 (Note: NST was not performed as part of this antepartum testing)   DOPPLER FLOW STUDIES: UMBILICAL ARTERY RI RATIOS:   0.58, 0.54   SUSPECTED ABNORMALITIES:  no  QUALITY OF SCAN: satisfactory  TECHNICIAN COMMENTS:  U/S(34+1wks)-vtx active fetus BPP 8/8, fluid WNL  AFI-13.7cm, FHR-163 bpm,  UA Doppler RI-0.54 & 0.58, anterior placenta with succ lobe posterior   A copy of this report including all images has been saved and backed up to  a second source for retrieval if needed. All measures and details of the  anatomical scan, placentation, fluid volume and pelvic anatomy are  contained in that report.  Lazarus Gowda 07/23/2014 1:39 PM  Clinical Impression and recommendations:  I have reviewed the sonogram results above, combined with the patient's  current clinical course, below are my impressions and any appropriate  recommendations for management based on the sonographic findings.   1.  [id number] Estimated Date of Delivery: 09/02/14 by  early ultrasound and  confirmed by today's sonographic dating 2.  Normal fetal sonographic findings, specifically normal fluid,  reassuring fetal testing/surveillance and excellent Doppler flow 3.  Normal general sonographic findings  Recommend continued twice weekly fetal surveillance  EURE,LUTHER H 07/23/2014 2:10 PM        US Fetal Bpp W/o Non Stress  07/15/2014    FOLLOW UP SONOGRAM   Fransisca L Barrows is in the office for a follow up sonogram for GHTN.  She is a 28 y.o. year old G58P1011 with Estimated Date of Delivery: 09/02/14  now at  [redacted]w[redacted]d weeks gestation. Thus far the pregnancy has been complicated  by  [facility].  GESTATION: SINGLETON  PRESENTATION: cephalic  FETAL ACTIVITY:          Heart rate         148 bpm          The fetus is active.  AMNIOTIC FLUID: The amniotic fluid volume is  normal, AFI-7.8 cm SDP-2.2cm.  PLACENTA LOCALIZATION:   GRADE 1  CERVIX: Unable to view  ADNEXA: The adnexa appears normal.   GESTATIONAL AGE AND  BIOMETRICS:  Gestational criteria: Estimated Date of Delivery: 09/02/14  now at [redacted]w[redacted]d  Previous Scans:  GESTATIONAL SAC            mm          weeks  CROWN RUMP LENGTH            mm          weeks  NUCHAL TRANSLUCENCY            mm           BIPARIETAL DIAMETER           8.4 cm         33+6 weeks  HEAD CIRCUMFERENCE           29.74 cm         32+6 weeks  ABDOMINAL CIRCUMFERENCE           27.58 cm         31+5 weeks  FEMUR LENGTH           5.94 cm         31+0 weeks                                                           AVERAGE EGA(BY THIS SCAN):   32+2 weeks  ESTIMATED FETAL WEIGHT:        1805  grams, 25 % ANATOMICAL SURVEY                                                                             COMMENTS CEREBRAL VENTRICLES yes normal   CHOROID PLEXUS yes normal   CEREBELLUM yes normal   CISTERNA MAGNA yes normal   NUCHAL REGION yes normal   ORBITS no    NASAL BONE no    NOSE/LIP no    FACIAL PROFILE no    4 CHAMBERED HEART yes normal   OUTFLOW TRACTS no    DIAPHRAGM yes normal   STOMACH yes normal   RENAL REGION no    BLADDER no    CORD INSERTION no    3 VESSEL CORD yes normal   SPINE no    ARMS/HANDS no    LEGS/FEET no    GENITALIA yes normal female        BIOPHYSCIAL PROFILE:                                                                                                       COMMENTS GROSS BODY  MOVEMENT                 2   TONE                2   RESPIRATIONS                2   AMNIOTIC FLUID                2                                                            SCORE:  8/8 (Note: NST was not performed as part of this antepartum testing)   DOPPLER FLOW STUDIES: UMBILICAL ARTERY RI RATIOS:   0.74, 0.71    SUSPECTED ABNORMALITIES:  no  QUALITY OF SCAN: satisfactory  TECHNICIAN COMMENTS:  U/S(33+0wks)-vtx active fetus, EFW 4 lb (25th%tile), fluid WNL AFI-7.8cm  SDp-2.2cm, FHR-148 bpm, UA Doppler RI-0.74 & 0.71, BPP 8/8, Gr 1 placenta  (succ lobe noted)   A copy of this report including all images has been saved and backed up to  a second source for retrieval if needed. All measures and details of the  anatomical scan, placentation, fluid volume and pelvic anatomy are  contained in that report.  Lazarus Gowda 07/15/2014 11:38 AM  Clinical Impression and recommendations:  I have reviewed the sonogram results above, combined with the  patient's  current clinical course, below are my impressions and any appropriate  recommendations for management based on the sonographic findings.   1.  [id number] Estimated Date of Delivery: 09/02/14 by  early ultrasound,  midtrimester ultrasound and confirmed by today's sonographic dating 2.  Normal fetal sonographic findings, normal fluid, normal Doppler flow  and normal fetal surveillance 3.  Normal general sonographic findings  Recommend routine prenatal care based on this sonogram or as clinically  indicated  EURE,LUTHER H 07/15/2014 12:00 PM        US Fetal Bpp W/o Non Stress  07/14/2014   OBSTETRICAL ULTRASOUND: This exam was performed within a [facility] [facility]. The OB US report was generated in the [facility] system, and faxed to the ordering physician.   This report is available in the [employer]. See the [facility] Obstetric US report via the Image Link.  [name] Ua Cord Doppler  07/23/2014   FOLLOW UP SONOGRAM   Dajia L Mangels is in the office for a follow up sonogram  for BPP/AFI/UAD.  She is a 28 y.o. year old G68P1011 with Estimated Date of Delivery: 09/02/14  now at  [redacted]w[redacted]d weeks gestation. Thus far the pregnancy has been complicated  by [facility].  GESTATION: SINGLETON  PRESENTATION: cephalic  FETAL ACTIVITY:          Heart rate         163 bpm          The fetus is active.  AMNIOTIC FLUID: The amniotic fluid volume is  normal, 13.7 cm SDP-5.2cm.  PLACENTA LOCALIZATION:  anterior with succ lobe posterior GRADE 2  CERVIX: N/A  ADNEXA: Adnexa appears WNL  GESTATIONAL AGE AND  BIOMETRICS:  Gestational criteria: Estimated Date of Delivery: 09/02/14  now at [redacted]w[redacted]d   BIOPHYSCIAL PROFILE:                                                                                                       [place]                 2   TONE                2   RESPIRATIONS                2   AMNIOTIC FLUID                2                                                            SCORE:  8/8 (Note: NST was not performed as part of this antepartum testing)   DOPPLER FLOW STUDIES: UMBILICAL ARTERY RI RATIOS:   0.58, 0.54   SUSPECTED ABNORMALITIES:  no  QUALITY OF SCAN: satisfactory  TECHNICIAN COMMENTS:  U/S(34+1wks)-vtx active fetus BPP 8/8, fluid WNL AFI-13.7cm, VQQ-595  bpm,  UA Doppler RI-0.54 & 0.58, anterior placenta with succ lobe posterior   A copy of this report including all images has been saved and backed up to  a second source for retrieval if needed. All measures and details of the  anatomical scan, placentation, fluid volume and pelvic anatomy are  contained in that report.  Lazarus Gowda 07/23/2014 1:39 PM  Clinical Impression and recommendations:  I have reviewed the sonogram results above, combined with the patient's  current clinical course, below are my impressions and any appropriate  recommendations for management based on the sonographic findings.   1.  [id number] Estimated Date of Delivery: 09/02/14 by  early ultrasound and  confirmed by today's sonographic dating 2.  Normal  fetal sonographic findings, specifically normal fluid,  reassuring fetal testing/surveillance and excellent Doppler flow 3.  Normal general sonographic findings  Recommend continued twice weekly fetal surveillance  EURE,LUTHER H 07/23/2014 2:10 PM        [name] Ua Cord Doppler  07/15/2014   FOLLOW UP SONOGRAM   Kazaria L Eckstein is in the office for a follow up sonogram for GHTN.  She is a 28 y.o. year old G57P1011 with Estimated Date of Delivery: 09/02/14  now at  [redacted]w[redacted]d weeks gestation. Thus far the pregnancy has been complicated  by  [facility].  GESTATION: SINGLETON  PRESENTATION: cephalic  FETAL ACTIVITY:          Heart rate         148 bpm          The fetus is active.  AMNIOTIC FLUID: The amniotic fluid volume is  normal, AFI-7.8 cm SDP-2.2cm.  PLACENTA LOCALIZATION:   GRADE 1  CERVIX: Unable to view  ADNEXA: The adnexa appears normal.   GESTATIONAL AGE AND  BIOMETRICS:  Gestational criteria: Estimated Date of Delivery: 09/02/14  now at [redacted]w[redacted]d  Previous Scans:  GESTATIONAL SAC            mm          weeks  CROWN RUMP LENGTH            mm          weeks  NUCHAL TRANSLUCENCY            mm           BIPARIETAL DIAMETER           8.4 cm         33+6 weeks  HEAD CIRCUMFERENCE           29.74 cm         32+6 weeks  ABDOMINAL CIRCUMFERENCE           27.58 cm         31+5 weeks  FEMUR LENGTH           5.94 cm         31+0 weeks                                                           AVERAGE EGA(BY THIS SCAN):   32+2 weeks  ESTIMATED FETAL WEIGHT:        1805  grams, 25 % ANATOMICAL SURVEY                                                                             COMMENTS CEREBRAL VENTRICLES yes normal   CHOROID PLEXUS yes normal   CEREBELLUM yes normal   CISTERNA MAGNA yes normal   NUCHAL REGION yes normal   ORBITS no    NASAL BONE no    NOSE/LIP no    FACIAL PROFILE no    4 CHAMBERED HEART yes normal   OUTFLOW TRACTS no    DIAPHRAGM yes normal   STOMACH yes normal   RENAL REGION no     BLADDER no    CORD INSERTION no    3 VESSEL CORD yes normal   SPINE no    ARMS/HANDS no    LEGS/FEET no    GENITALIA yes normal female        BIOPHYSCIAL PROFILE:                                                                                                       COMMENTS GROSS BODY MOVEMENT                 2   TONE                2   RESPIRATIONS                2   AMNIOTIC FLUID                2                                                            SCORE:  8/8 (Note: NST was not performed as part of this antepartum testing)   DOPPLER FLOW STUDIES: UMBILICAL ARTERY RI RATIOS:   0.74, 0.71    SUSPECTED ABNORMALITIES:  no  QUALITY OF SCAN: satisfactory  TECHNICIAN COMMENTS:  U/S(33+0wks)-vtx active fetus, EFW 4 lb (25th%tile), fluid WNL AFI-7.8cm  SDp-2.2cm, FHR-148 bpm, UA Doppler RI-0.74 & 0.71, BPP 8/8, Gr 1 placenta  (succ lobe noted)   A copy of this report including all images has been saved and backed up to  a second source for retrieval if needed. All measures and details of the  anatomical scan, placentation, fluid volume and pelvic anatomy are  contained in that report.  Lazarus Gowda 07/15/2014 11:38 AM  Clinical Impression and recommendations:  I have reviewed the sonogram results above, combined with the  patient's  current clinical course, below are my impressions and any appropriate  recommendations for management based on the sonographic findings.   1.  [id number] Estimated Date of Delivery: 09/02/14 by  early ultrasound,  midtrimester ultrasound and confirmed by today's sonographic dating 2.  Normal fetal sonographic findings, normal fluid, normal Doppler flow  and normal fetal surveillance 3.  Normal general sonographic findings  Recommend routine prenatal care based on this sonogram or as clinically  indicated  EURE,LUTHER H 07/15/2014 12:00 PM        US Fetal Bpp W/non Stress  08/04/2014   FOLLOW UP SONOGRAM   Tristyn L Barbeau is in the office for a follow up sonogram for Nr NST.  She is a 28  y.o. year old G73P1011 with Estimated Date of Delivery: 09/02/14   now at  [redacted]w[redacted]d weeks gestation. Thus far the pregnancy has been  complicated by poor hx.  GESTATION: SINGLETON  PRESENTATION: cephalic  FETAL ACTIVITY:          Heart rate         148 bpm          The fetus is active.  AMNIOTIC FLUID: The amniotic fluid volume is  normal, 12.4 cm SDP-4.5cm.  PLACENTA LOCALIZATION:  anterior, posterior GRADE 2  CERVIX: Unable to view  ADNEXA: The adnexa appears normal.   GESTATIONAL AGE AND  BIOMETRICS:  Gestational criteria: Estimated Date of Delivery: 09/02/14 now at [redacted]w[redacted]d   BIOPHYSCIAL PROFILE:                                                                                                       [place]                 2   TONE                2   RESPIRATIONS                2   AMNIOTIC FLUID                2   NST                                                       0                                                          SCORE:  8/10 (Note: NST was performed as part of this antepartum testing)     SUSPECTED ABNORMALITIES:  no  QUALITY OF SCAN: satisfactory  TECHNICIAN COMMENTS:  U/S-vtx active fetus BPP 8/10, fluid WNL AFI-12.4cm SDP-4.5cm, FHR-148 bpm   A copy of this report including all  images has been saved and backed up to  a second source for retrieval if needed. All measures and details of the  anatomical scan, placentation, fluid volume and pelvic anatomy are  contained in that report.  Lazarus Gowda 07/26/2014 12:30 PM  Clinical Impression and recommendations:  I have reviewed the sonogram results above, combined with the patient's  current clinical course, below are my impressions and any appropriate  recommendations for management based on the sonographic findings.   1.  [id number] Estimated Date of Delivery: 09/02/14 by  early ultrasound  2.  Normal fetal sonographic findings, specifically reassuring fetal  surveillance after a NR NST 3.  Normal general sonographic findings  Recommend  routine prenatal care based on this sonogram or as clinically  indicated  EURE,LUTHER H 08/04/2014 5:17 PM         1:55 AM patient is to speak slowly. She is alert complains of left-sided temporal headache. Toradol are ordered. Of note, she is not breast-feeding. 2:30 AM she reports no relief after treatment with intravenous Toradol. She lying with eyes open speaks extremely slowly. Follow simple commands, moves all extremities. Flat affect. Remains with mild resting tachycardia approximately 105 beats per minute, sinus tachycardia. Potassium was supplemented orally after she passed swallowing screen   MDM  I suspect patient may be having seizures. There may be a psychiatric overlay to her findings as well. I spoke with Dr. Ernestina Patches who will arrange for inpatient stay. No evidence of eclampsia. Normal blood pressure no proteinuria. No edema Diagnosis #1 acute encephalopathy #2 headache #3 hypokalemia Final diagnoses:  None   CRITICAL CARE Performed by: Orlie Dakin Total critical care time: 30 minute Critical care time was exclusive of separately billable procedures and treating other patients. Critical care was necessary to treat or prevent imminent or life-threatening deterioration. Critical care was time spent personally by me on the following activities: development of treatment plan with patient and/or surrogate as well as nursing, discussions with consultants, evaluation of patient's response to treatment, examination of patient, obtaining history from patient or surrogate, ordering and performing treatments and interventions, ordering and review of laboratory studies, ordering and review of radiographic studies, pulse oximetry and re-evaluation of patient's condition.     Orlie Dakin, MD 08/13/14 [place], MD 08/13/14 0234  Addendum Dr. Ernestina Patches thyroid patient in the emergency department and made arrangements for transfer to [facility]. He asked that I  contact neurology at [facility]. I spoke with Dr. Nicole Kindred, [facility] neurologist who will see patient in consultation. She will be admitted to the hospitalist service at [place], MD 08/13/14 0309  3:30 a.m. continues to complain of headache. Exam unchanged. She is alert, moves all extremities.  Orlie Dakin, MD 08/13/14 [phone]

## 2014-08-13 NOTE — Procedures (Signed)
History: 28 year old female with episodes of concern for possible seizures.  Sedation: None  Technique: This is a 17 channel routine scalp EEG performed at the bedside with bipolar and monopolar montages arranged in accordance to the international 10/20 system of electrode placement. One channel was dedicated to EKG recording.    Background: The background consists of intermixed alpha and beta activities. There is a well defined posterior dominant rhythm of 10.5 Hz that attenuates with eye opening. There is mild increase in total associated with drowsiness. Sleep structures were observed.  Photic stimulation: Physiologic driving is present  EEG Abnormalities: None  Clinical Interpretation: This normal EEG is recorded in the waking and sleep state. There was no seizure or seizure predisposition recorded on this study.   Roland Rack, MD [facility] [phone]  If 7pm- 7am, please page neurology on call as listed in [place].

## 2014-08-14 ENCOUNTER — Inpatient Hospital Stay (HOSPITAL_COMMUNITY): Payer: Medicaid Other

## 2014-08-14 DIAGNOSIS — D649 Anemia, unspecified: Secondary | ICD-10-CM

## 2014-08-14 DIAGNOSIS — R51 Headache: Secondary | ICD-10-CM

## 2014-08-14 DIAGNOSIS — G9340 Encephalopathy, unspecified: Secondary | ICD-10-CM

## 2014-08-14 LAB — CBC WITH DIFFERENTIAL/PLATELET
BASOS ABS: 0 10*3/uL (ref 0.0–0.1)
BASOS PCT: 0 % (ref 0–1)
Eosinophils Absolute: 0.3 10*3/uL (ref 0.0–0.7)
Eosinophils Relative: 5 % (ref 0–5)
HCT: 30.2 % — ABNORMAL LOW (ref 36.0–46.0)
HEMOGLOBIN: 10.1 g/dL — AB (ref 12.0–15.0)
Lymphocytes Relative: 45 % (ref 12–46)
Lymphs Abs: 2.6 10*3/uL (ref 0.7–4.0)
MCH: 30.1 pg (ref 26.0–34.0)
MCHC: 33.4 g/dL (ref 30.0–36.0)
MCV: 90.1 fL (ref 78.0–100.0)
Monocytes Absolute: 0.6 10*3/uL (ref 0.1–1.0)
Monocytes Relative: 10 % (ref 3–12)
NEUTROS PCT: 40 % — AB (ref 43–77)
Neutro Abs: 2.3 10*3/uL (ref 1.7–7.7)
Platelets: 279 10*3/uL (ref 150–400)
RBC: 3.35 MIL/uL — ABNORMAL LOW (ref 3.87–5.11)
RDW: 13.2 % (ref 11.5–15.5)
WBC: 5.8 10*3/uL (ref 4.0–10.5)

## 2014-08-14 LAB — COMPREHENSIVE METABOLIC PANEL
ALK PHOS: 96 U/L (ref 39–117)
ALT: 20 U/L (ref 0–35)
AST: 27 U/L (ref 0–37)
Albumin: 2.8 g/dL — ABNORMAL LOW (ref 3.5–5.2)
Anion gap: 9 (ref 5–15)
BUN: 17 mg/dL (ref 6–23)
CO2: 25 mEq/L (ref 19–32)
CREATININE: 0.5 mg/dL (ref 0.50–1.10)
Calcium: 8.3 mg/dL — ABNORMAL LOW (ref 8.4–10.5)
Chloride: 107 mEq/L (ref 96–112)
GFR calc non Af Amer: >90 mL/min (ref >90)
GLUCOSE: 92 mg/dL (ref 70–99)
POTASSIUM: 3.8 meq/L (ref 3.7–5.3)
Sodium: 141 mEq/L (ref 137–147)
TOTAL PROTEIN: 5.8 g/dL — AB (ref 6.0–8.3)
Total Bilirubin: 0.3 mg/dL (ref 0.3–1.2)

## 2014-08-14 MED ORDER — CLONAZEPAM 0.5 MG PO TABS
0.5000 mg | ORAL_TABLET | Freq: Two times a day (BID) | ORAL | Status: DC
  Administered 2014-08-14 – 2014-08-17 (×7): 0.5 mg via ORAL
  Filled 2014-08-14 (×7): qty 1

## 2014-08-14 MED ORDER — NORTRIPTYLINE HCL 10 MG PO CAPS
10.0000 mg | ORAL_CAPSULE | Freq: Every day | ORAL | Status: DC
  Administered 2014-08-14 – 2014-08-16 (×3): 10 mg via ORAL
  Filled 2014-08-14 (×4): qty 1

## 2014-08-14 MED ORDER — HYDROCODONE-ACETAMINOPHEN 5-325 MG PO TABS
1.0000 | ORAL_TABLET | ORAL | Status: DC | PRN
  Administered 2014-08-15 – 2014-08-17 (×8): 1 via ORAL
  Filled 2014-08-14 (×9): qty 1

## 2014-08-14 MED ORDER — HYDROMORPHONE HCL PF 1 MG/ML IJ SOLN
0.5000 mg | INTRAMUSCULAR | Status: AC | PRN
  Administered 2014-08-14 – 2014-08-15 (×4): 1 mg via INTRAVENOUS
  Filled 2014-08-14 (×4): qty 1

## 2014-08-14 MED ORDER — ESCITALOPRAM OXALATE 10 MG PO TABS
20.0000 mg | ORAL_TABLET | Freq: Every day | ORAL | Status: DC
  Administered 2014-08-14 – 2014-08-17 (×4): 20 mg via ORAL
  Filled 2014-08-14 (×4): qty 2

## 2014-08-14 MED ORDER — BUTALBITAL-APAP-CAFFEINE 50-325-40 MG PO TABS: 1.0000 | ORAL_TABLET | Freq: Four times a day (QID) | ORAL | Status: DC | PRN

## 2014-08-14 NOTE — Progress Notes (Signed)
NEURO HOSPITALIST PROGRESS NOTE   SUBJECTIVE:                                                                                                                        Resting comfortably in bed, said that she is feeling better today. Had 4 of her habitual spells since last night, all of them without frank EEG correlate. MRI brain ordered and pending.   OBJECTIVE:                                                                                                                           Vital signs in last 24 hours: Temp:  [97.5 F (36.4 C)-98.8 F (37.1 C)] 97.5 F (36.4 C) (09/09 0538) Pulse Rate:  [71-93] 71 (09/09 0538) Resp:  [20] 20 (09/09 0538) BP: (125-131)/(71-95) 131/71 mmHg (09/09 0538) SpO2:  [97 %-100 %] 97 % (09/09 0538)  Intake/Output from previous day:   Intake/Output this shift:   Nutritional status: General  Past Medical History  Diagnosis Date  . Migraines   . Interstitial cystitis   . IUD FEB 2010  . Gastritis JULY 2011  . Depression   . 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 Fatty liver   . Obesity (BMI 30-39.9) 2011 228 LBS BMI 36.8  . Iron deficiency anemia 07/23/2010  . Irritable bowel syndrome 2012 DIARRHEA    JUN 2012 TTG IgA 14.9  . Elevated liver enzymes JUL 2011ALK PHOS 111-127 AST  143-267 ALT  213-321T BILI 0.6  ALB  3.7-4.06 Jun 2011 ALK PHOS 118 AST 24 ALT 42 T BILI 0.4 ALB 3.9  . Chronic daily headache   . Ovarian cyst   . PONV (postoperative nausea and vomiting)   . ADD (attention deficit disorder)   . RLQ abdominal pain 07/18/2013  . Menorrhagia 07/18/2013  . Anxiety   . Potassium (K) deficiency   . Dysmenorrhea 09/18/2013  . Stress 09/18/2013  . Patient desires pregnancy 09/18/2013  . Pregnant 12/25/2013  . Blood transfusion without reported diagnosis   . Heart murmur   . Gastric bypass status for obesity     Neurologic Exam:  Mental Status:  Alert, oriented, thought content appropriate.  Speech fluent without evidence of aphasia. Able to follow 3 step commands without difficulty.  Cranial Nerves:  II: Discs flat bilaterally; Visual fields grossly normal, pupils equal, round, reactive to light and accommodation  III,IV, VI: ptosis not present, extra-ocular motions intact bilaterally  V,VII: smile symmetric, facial light touch sensation normal bilaterally  VIII: hearing normal bilaterally  IX,X: gag reflex present  XI: bilateral shoulder shrug  XII: midline tongue extension without atrophy or fasciculations  Motor:  Right : Upper extremity 5/5 Left: Upper extremity 5/5  Lower extremity 5/5 Lower extremity 5/5  Tone and bulk:normal tone throughout; no atrophy noted  Sensory: Pinprick and light touch intact throughout, bilaterally  Deep Tendon Reflexes:  Right: Upper Extremity Left: Upper extremity  biceps (C-5 to C-6) 2/4 biceps (C-5 to C-6) 2/4  tricep (C7) 2/4 triceps (C7) 2/4  Brachioradialis (C6) 2/4 Brachioradialis (C6) 2/4  Lower Extremity Lower Extremity  quadriceps (L-2 to L-4) 2/4 quadriceps (L-2 to L-4) 2/4  Achilles (S1) 2/4 Achilles (S1) 2/4  Plantars:  Right: downgoing Left: downgoing  Cerebellar:  normal finger-to-nose, normal heel-to-shin test  Gait: no tested   Lab Results: Lab Results  Component Value Date/Time   CHOL 95 03/10/2012  6:42 AM   Lipid Panel No results found for this basename: CHOL, TRIG, HDL, CHOLHDL, VLDL, LDLCALC,  in the last 72 hours  Studies/Results: Ct Head Wo Contrast  08/13/2014   CLINICAL DATA:  Loss of consciousness  EXAM: CT HEAD WITHOUT CONTRAST  TECHNIQUE: Contiguous axial images were obtained from the base of the skull through the vertex without intravenous contrast.  COMPARISON:  Prior MRI from 07/31/2014  FINDINGS: There is no acute intracranial hemorrhage or infarct. No mass lesion or midline shift. Gray-white matter differentiation is well maintained. Ventricles are normal in size without evidence of hydrocephalus. CSF  containing spaces are within normal limits. No extra-axial fluid collection.  The calvarium is intact.  Orbital soft tissues are within normal limits.  Minimal opacity noted within the partially visualized right maxillary sinus. Otherwise, the paranasal sinuses and mastoid air cells are well pneumatized and free of fluid.  Scalp soft tissues are unremarkable.  IMPRESSION: Negative head CT.  No acute intracranial abnormality identified.   Electronically Signed   By: Jeannine Boga M.D.   On: 08/13/2014 01:51    MEDICATIONS                                                                                                                      Scheduled medications reviewed.   ASSESSMENT/PLAN:  28 y/o without known risk factors for epilepsy but with a history of complicated migraine headaches presenting with current migraine type headache with mental status changes and episodes of posturing of extremities of unclear etiology. Had several of her habitual posturing spells without clear EEG correlate. Will discontinue EEG monitoring and wait for final report from [facility]. MRI brain pending (of note, very recentMRI brain, thoracic and lumbar spine are all normal) Will follow up.  Dorian Pod, MD [facility] [phone]  08/14/2014, 9:08 AM

## 2014-08-14 NOTE — Progress Notes (Signed)
Nurse called in to patients room and witnessed an episode of seizure like activity that lasted 46mins. Left side appeared to be contracted more than the right. Patients family at the bedside and transport team here to pick up patient for MRI. Will wait a few to assess patient orientation and have nurse transport with patient.

## 2014-08-14 NOTE — Progress Notes (Signed)
[facility] PROGRESS NOTE  Penny [name] OVZ:[id number] DOB: 05/08/1986 DOA: 08/13/2014  PCP: [name], MD  Brief HPI: 28yo with PMH as below who is post partum with history of preeclampsia and who presented with headaches with questionable posturing at home. No GTC activity.   Past medical history:  Past Medical History  Diagnosis Date  . Migraines   . Interstitial cystitis   . IUD FEB 2010  . Gastritis JULY 2011  . Depression   . 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 Fatty liver   . Obesity (BMI 30-39.9) 2011 228 LBS BMI 36.8  . Iron deficiency anemia 07/23/2010  . Irritable bowel syndrome 2012 DIARRHEA    JUN 2012 TTG IgA 14.9  . Elevated liver enzymes JUL 2011ALK PHOS 111-127 AST  143-267 ALT  213-321T BILI 0.6  ALB  3.7-4.06 Jun 2011 ALK PHOS 118 AST 24 ALT 42 T BILI 0.4 ALB 3.9  . Chronic daily headache   . Ovarian cyst   . PONV (postoperative nausea and vomiting)   . ADD (attention deficit disorder)   . RLQ abdominal pain 07/18/2013  . Menorrhagia 07/18/2013  . Anxiety   . Potassium (K) deficiency   . Dysmenorrhea 09/18/2013  . Stress 09/18/2013  . Patient desires pregnancy 09/18/2013  . Pregnant 12/25/2013  . Blood transfusion without reported diagnosis   . Heart murmur   . Gastric bypass status for obesity     Consultants: Neurology  Procedures: Continuous EEG  Antibiotics: None  Subjective: Patient continues to have a headache on left side. Denies any other complaints.  Objective: Vital Signs  Filed Vitals:   08/13/14 1815 08/13/14 2146 08/14/14 0121 08/14/14 0538  BP: 126/78 126/95 126/76 131/71  Pulse: 93 93 90 71  Temp: 98 F (36.7 C) 98.1 F (36.7 C) 98 F (36.7 C) 97.5 F (36.4 C)  TempSrc: Oral Oral Oral Oral  Resp: $Remo'20 20 20 20  'kqsBG$ Height:      Weight:      SpO2: 98% 97% 98% 97%   No intake or output data in the 24 hours ending 08/14/14 0826 Filed Weights   08/13/14 0055 08/13/14 0535  Weight: 99.791 kg (220 lb)  97.932 kg (215 lb 14.4 oz)    General appearance: alert, cooperative, appears stated age and no distress Head: Normocephalic, without obvious abnormality, atraumatic Neck: no adenopathy, no carotid bruit, no JVD, supple, symmetrical, trachea midline and thyroid not enlarged, symmetric, no tenderness/mass/nodules Resp: clear to auscultation bilaterally Cardio: regular rate and rhythm, S1, S2 normal, no murmur, click, rub or gallop GI: soft, non-tender; bowel sounds normal; no masses,  no organomegaly Extremities: extremities normal, atraumatic, no cyanosis or edema Neurologic: No focal deficits  Lab Results:  Basic Metabolic Panel:  Recent Labs Lab 08/13/14 0109 08/13/14 0254 08/13/14 0307 08/14/14 0340  NA 142  --  140 141  K 3.2*  --  3.9 3.8  CL 108  --  107 107  CO2 17*  --  20 25  GLUCOSE 94  --  120* 92  BUN 11  --  13 17  CREATININE 0.44*  --  0.43* 0.50  CALCIUM 8.9  --  8.6 8.3*  MG  --  2.2  --   --    Liver Function Tests:  Recent Labs Lab 08/13/14 0109 08/13/14 0307 08/14/14 0340  AST $Re'21 20 27  'EPJ$ ALT $R'19 18 20  'hp$ ALKPHOS 118* 112 96  BILITOT 0.3  0.3 0.3  PROT 6.9 6.8 5.8*  ALBUMIN 3.4* 3.3* 2.8*    Recent Labs Lab 08/13/14 0307  AMMONIA 22   CBC:  Recent Labs Lab 08/13/14 0109 08/13/14 0307 08/14/14 0340  WBC 7.0 7.8 5.8  NEUTROABS 3.4 5.2 2.3  HGB 11.6* 11.0* 10.1*  HCT 33.9* 32.0* 30.2*  MCV 88.7 88.6 90.1  PLT 386 332 279     Studies/Results: Ct Head Wo Contrast  08/13/2014   CLINICAL DATA:  Loss of consciousness  EXAM: CT HEAD WITHOUT CONTRAST  TECHNIQUE: Contiguous axial images were obtained from the base of the skull through the vertex without intravenous contrast.  COMPARISON:  Prior MRI from 07/31/2014  FINDINGS: There is no acute intracranial hemorrhage or infarct. No mass lesion or midline shift. Gray-white matter differentiation is well maintained. Ventricles are normal in size without evidence of hydrocephalus. CSF containing  spaces are within normal limits. No extra-axial fluid collection.  The calvarium is intact.  Orbital soft tissues are within normal limits.  Minimal opacity noted within the partially visualized right maxillary sinus. Otherwise, the paranasal sinuses and mastoid air cells are well pneumatized and free of fluid.  Scalp soft tissues are unremarkable.  IMPRESSION: Negative head CT.  No acute intracranial abnormality identified.   Electronically Signed   By: Jeannine Boga M.D.   On: 08/13/2014 01:51    Medications:  Scheduled:  Continuous: . sodium chloride 75 mL/hr at 08/13/14 0323   YLT:EIHDTPNSQZ, HYDROcodone-acetaminophen, HYDROmorphone (DILAUDID) injection, ketorolac, promethazine  Assessment/Plan:  Active Problems:   Encephalopathy   Headache(784.0)   Altered mental status    Acute Encephalopathy/headache  Broad differential for symptoms including complex migraine, seizure disorder. EEG monitoring is ongoing. Neurology is following. No proteinuria, anemia improved, LFTs within normal limits, platelets within normal limits. There could be a psychiatric component. Will await final Neuro input. MRI brain is pending. Might need to involve Psyche if no neurological issue is found. Resume her home medications.  Normocytic Anemia  Stable.   Metabolic Acidosis  Improved. Etiology is unclear. Lactic acid was normal.   Postpartum Stable. Some vaginal spotting. No heavy bleeding.  DVT Prophylaxis: SCD's    Code Status: Full Code  Family Communication: Discussed with patient, her husband and father  Disposition Plan: Not ready for discharge    LOS: 1 day   [facility] Pager [phone] 08/14/2014, 8:26 AM  If 8PM-8AM, please contact night-coverage at [email or web address], password [email or web address]   Disclaimer: This note was dictated with voice recognition software. Similar sounding words can inadvertently be transcribed and may not be corrected upon review.

## 2014-08-14 NOTE — Procedures (Signed)
Electroencephalogram report- LTM  Ordering Physician : Dr. Ottis Stain  EEG number: [phone]    Beginning date and time: 08/13/2014 2:13PM Ending date and time:   08/14/2014 9:17AM  Day of study: 1  Medications include: PER EMR  MENTAL STATUS (per technician's notes): Alert, Oriented 3.   HISTORY: This 24 hours of intensive EEG monitoring with simultaneous video monitoring was performed for this patient with frequent seizure-like spells.  This EEG was requested to characterize these events to guide medical management.  TECHNICAL DESCRIPTION:  The study consists of a continuous 16-channel multi-montage digital video EEG recording with twenty-one electrodes placed according to the International 10-20 System. Additional leads included true temporal leads (T1, T2), and an EKG lead. Activation procedures were not done.  REPORT:  Interictal description  The background activity while awake was characterized by well-developed 9-10 Hz, 20-40 microvolts symmetric posterior rhythm that attenuates appropriately to eye opening.  Sleep stages and REM were observed with normal sleep architecture. No focal slowing or epileptiform activity was seen.  Ictal description: 4 push button events were recorded during the monitoring.    Event included arrythmic and asymmetric movement of the extremities. There is flexion of the legs at the knees and some flexion/extension movement at the ankles. The arms in one of the event were by the side and covered, while in the other event the left arm was extended by the side. There is variable posturing of the trunk through the events. Eyes are closed.  Electrographically, no epileptiform correlate was seen on the EEG.  There is muscle artifact and normal awake background during the event.  There is no postictal slowing.  CLINICAL CORRELATION: Based on the data collected in this video EEG monitoring, the patient's spells are nonepileptic in nature.

## 2014-08-14 NOTE — Progress Notes (Addendum)
Pt's husband called this nurse into her room because she was having one of her "episodes".  When I entered the room pt was noted to have blank stare, mouth open, arms and legs tensed up, tighter on the left side.  Episode lasted less than a minute.  While still in her room, pt had two more episodes, both still lasting less than a minute.  Pt able to talk right afterwards, A/O x 4.  C/o headache on the left side, medication given.  According to her husband this is the first time she has had them back to back.  Will continue to monitor. Cori Razor, RN 08/14/2014

## 2014-08-14 NOTE — Progress Notes (Signed)
Patient experienced 2 additional episodes of posturing that are described in nature as her previous events lasting approxmately 3 mins. Once at 9:30p while in MRI and again in the patients room at 11:40p. Each time patient comes out of her "spells" she complains of HA on the left side but refuses PO pain meds.  Discussed with Dr. Doy Mince the above findings. No new orders given at this time. Will continue to monitor per shift.

## 2014-08-14 NOTE — Progress Notes (Signed)
LTM EEG maint done. No skin breakdown at [place] [facility].

## 2014-08-14 NOTE — Progress Notes (Signed)
LTM DC per Dr. Armida Sans

## 2014-08-14 NOTE — Progress Notes (Signed)
Pt c/o "twitching" on the left side, says "while sitting in the dark my left eye was twitching".  She says that she normally has the twitching when she is having one of her episodes.  She says this is the first time without.  MD made aware.  Will continue to monitor. Cori Razor, RN 08/14/2014 5:23 PM

## 2014-08-14 NOTE — Progress Notes (Signed)
Pt was up to bathroom to wash her hair, stated she was about to give out, returned to bed.  This nurse was called to her room because she was having one of her episodes.  When I entered the room she was on her way out of it.  Her mouth was open, limbs slightly contracted, eyes closed.  Pt was able to slowly talk to me, A/O x 4.  Per husband it lasted 1-1.5 minutes.  Pt c/o HA 9/10, pain medication given.  Will continue to monitor. Cori Razor, RN 08/14/2014

## 2014-08-15 DIAGNOSIS — Z8669 Personal history of other diseases of the nervous system and sense organs: Secondary | ICD-10-CM

## 2014-08-15 LAB — CBC
HEMATOCRIT: 29.7 % — AB (ref 36.0–46.0)
Hemoglobin: 9.9 g/dL — ABNORMAL LOW (ref 12.0–15.0)
MCH: 29.3 pg (ref 26.0–34.0)
MCHC: 33.3 g/dL (ref 30.0–36.0)
MCV: 87.9 fL (ref 78.0–100.0)
PLATELETS: 261 10*3/uL (ref 150–400)
RBC: 3.38 MIL/uL — AB (ref 3.87–5.11)
RDW: 13.3 % (ref 11.5–15.5)
WBC: 4.9 10*3/uL (ref 4.0–10.5)

## 2014-08-15 LAB — BASIC METABOLIC PANEL
ANION GAP: 11 (ref 5–15)
BUN: 11 mg/dL (ref 6–23)
CHLORIDE: 104 meq/L (ref 96–112)
CO2: 25 meq/L (ref 19–32)
Calcium: 8.4 mg/dL (ref 8.4–10.5)
Creatinine, Ser: 0.45 mg/dL — ABNORMAL LOW (ref 0.50–1.10)
GFR calc Af Amer: >90 mL/min (ref >90)
GFR calc non Af Amer: >90 mL/min (ref >90)
Glucose, Bld: 89 mg/dL (ref 70–99)
POTASSIUM: 3.9 meq/L (ref 3.7–5.3)
Sodium: 140 mEq/L (ref 137–147)

## 2014-08-15 MED ORDER — HYDROMORPHONE HCL PF 1 MG/ML IJ SOLN: 0.5000 mg | INTRAMUSCULAR | Status: DC | PRN

## 2014-08-15 MED ORDER — NAPROXEN 250 MG PO TABS
500.0000 mg | ORAL_TABLET | Freq: Two times a day (BID) | ORAL | Status: DC
  Administered 2014-08-15 – 2014-08-16 (×2): 500 mg via ORAL
  Filled 2014-08-15 (×3): qty 2

## 2014-08-15 MED ORDER — OXYCODONE HCL 5 MG PO TABS
5.0000 mg | ORAL_TABLET | Freq: Once | ORAL | Status: AC
  Administered 2014-08-15: 5 mg via ORAL
  Filled 2014-08-15: qty 1

## 2014-08-15 MED ORDER — METHOCARBAMOL 1000 MG/10ML IJ SOLN
500.0000 mg | Freq: Four times a day (QID) | INTRAVENOUS | Status: DC
  Administered 2014-08-15 – 2014-08-17 (×7): 500 mg via INTRAVENOUS
  Filled 2014-08-15 (×13): qty 5

## 2014-08-15 MED ORDER — VALPROATE SODIUM 500 MG/5ML IV SOLN
500.0000 mg | Freq: Once | INTRAVENOUS | Status: AC
  Administered 2014-08-15: 500 mg via INTRAVENOUS
  Filled 2014-08-15 (×2): qty 5

## 2014-08-15 MED ORDER — PANTOPRAZOLE SODIUM 40 MG PO TBEC
40.0000 mg | DELAYED_RELEASE_TABLET | Freq: Every day | ORAL | Status: DC
  Administered 2014-08-16 – 2014-08-17 (×2): 40 mg via ORAL
  Filled 2014-08-15 (×2): qty 1

## 2014-08-15 MED ORDER — SODIUM CHLORIDE 0.9 % IV SOLN
1000.0000 mg | Freq: Every day | INTRAVENOUS | Status: DC
  Administered 2014-08-15 – 2014-08-17 (×3): 1000 mg via INTRAVENOUS
  Filled 2014-08-15 (×4): qty 8

## 2014-08-15 NOTE — Progress Notes (Signed)
[facility] PROGRESS NOTE  Penny [name] TKW:[id number] DOB: 10-Oct-1986 DOA: 08/13/2014  PCP: [name], MD  Brief HPI: 28yo with PMH as below who is post partum with history of preeclampsia and who presented with headaches with questionable posturing at home. No GTC activity.   Past medical history:  Past Medical History  Diagnosis Date  . Migraines   . Interstitial cystitis   . IUD FEB 2010  . Gastritis JULY 2011  . Depression   . 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 Fatty liver   . Obesity (BMI 30-39.9) 2011 228 LBS BMI 36.8  . Iron deficiency anemia 07/23/2010  . Irritable bowel syndrome 2012 DIARRHEA    JUN 2012 TTG IgA 14.9  . Elevated liver enzymes JUL 2011ALK PHOS 111-127 AST  143-267 ALT  213-321T BILI 0.6  ALB  3.7-4.06 Jun 2011 ALK PHOS 118 AST 24 ALT 42 T BILI 0.4 ALB 3.9  . Chronic daily headache   . Ovarian cyst   . PONV (postoperative nausea and vomiting)   . ADD (attention deficit disorder)   . RLQ abdominal pain 07/18/2013  . Menorrhagia 07/18/2013  . Anxiety   . Potassium (K) deficiency   . Dysmenorrhea 09/18/2013  . Stress 09/18/2013  . Patient desires pregnancy 09/18/2013  . Pregnant 12/25/2013  . Blood transfusion without reported diagnosis   . Heart murmur   . Gastric bypass status for obesity     Consultants: Neurology  Procedures: Continuous EEG  Antibiotics: None  Subjective: Patient continues to have a headache on left side. Husband also reports further episodes of these "spells" where she tenses up.   Objective: Vital Signs  Filed Vitals:   08/14/14 2038 08/15/14 0106 08/15/14 0618 08/15/14 1007  BP: 130/83 127/82 116/69 124/76  Pulse: 81 81 76 85  Temp: 99.1 F (37.3 C) 98.6 F (37 C) 98.4 F (36.9 C) 98.2 F (36.8 C)  TempSrc: Oral Oral Oral Oral  Resp: $Remo'18 18 20 18  'TMTnM$ Height:      Weight:      SpO2: 99% 100% 100% 100%    Intake/Output Summary (Last 24 hours) at 08/15/14 1237 Last data filed at 08/15/14  0936  Gross per 24 hour  Intake    720 ml  Output      0 ml  Net    720 ml   Filed Weights   08/13/14 0055 08/13/14 0535  Weight: 99.791 kg (220 lb) 97.932 kg (215 lb 14.4 oz)    General appearance: alert, cooperative, appears stated age and no distress Resp: clear to auscultation bilaterally Cardio: regular rate and rhythm, S1, S2 normal, no murmur, click, rub or gallop GI: soft, non-tender; bowel sounds normal; no masses,  no organomegaly Extremities: extremities normal, atraumatic, no cyanosis or edema Neurologic: Slight weakness on left side. No facial assymetry.  Lab Results:  Basic Metabolic Panel:  Recent Labs Lab 08/13/14 0109 08/13/14 0254 08/13/14 0307 08/14/14 0340 08/15/14 0546  NA 142  --  140 141 140  K 3.2*  --  3.9 3.8 3.9  CL 108  --  107 107 104  CO2 17*  --  $R'20 25 25  'FL$ GLUCOSE 94  --  120* 92 89  BUN 11  --  $R'13 17 11  'tB$ CREATININE 0.44*  --  0.43* 0.50 0.45*  CALCIUM 8.9  --  8.6 8.3* 8.4  MG  --  2.2  --   --   --  Liver Function Tests:  Recent Labs Lab 08/13/14 0109 08/13/14 0307 08/14/14 0340  AST $Re'21 20 27  'mOr$ ALT $R'19 18 20  'Ac$ ALKPHOS 118* 112 96  BILITOT 0.3 0.3 0.3  PROT 6.9 6.8 5.8*  ALBUMIN 3.4* 3.3* 2.8*    Recent Labs Lab 08/13/14 0307  AMMONIA 22   CBC:  Recent Labs Lab 08/13/14 0109 08/13/14 0307 08/14/14 0340 08/15/14 0546  WBC 7.0 7.8 5.8 4.9  NEUTROABS 3.4 5.2 2.3  --   HGB 11.6* 11.0* 10.1* 9.9*  HCT 33.9* 32.0* 30.2* 29.7*  MCV 88.7 88.6 90.1 87.9  PLT 386 332 279 261     Studies/Results: Mr Brain Wo Contrast  08/14/2014   CLINICAL DATA:  Assess for stroke. Two weeks postpartum for preeclampsia. Past history of complicated migraine, depression, irritable bowel syndrome, anxiety and gastric bypass.  EXAM: MRI HEAD WITHOUT CONTRAST  TECHNIQUE: Multiplanar, multiecho pulse sequences of the brain and surrounding structures were obtained without intravenous contrast.  COMPARISON:  CT of the head August 13, 2014 and  MRI of the brain July 31, 2014  FINDINGS: The ventricles and sulci are normal. No abnormal parenchymal signal, mass lesions or mass of affect. No reduced diffusion to suggest acute ischemia. No susceptibility artifact to suggest hemorrhage.  No abnormal extra-axial fluid collection. No abnormal calvarial bone marrow signal. Ocular globes and orbital contents are unremarkable though not tailored for evaluation. Visualized paranasal sinuses and mastoid air cells appear well-aerated. No abnormal sellar expansion. Craniocervical junction is maintained.  IMPRESSION: No acute ischemia.  Normal MRI of the head without contrast.   Electronically Signed   By: Elon Alas   On: 08/14/2014 22:24    Medications:  Scheduled: . clonazePAM  0.5 mg Oral BID  . escitalopram  20 mg Oral Daily  . nortriptyline  10 mg Oral QHS   Continuous: . sodium chloride 75 mL/hr at 08/15/14 1007   NTZ:[id number], HYDROcodone-acetaminophen, ketorolac, promethazine  Assessment/Plan:  Active Problems:   Encephalopathy   Headache(784.0)   Altered mental status    Acute Encephalopathy/Unclear Spells No clear etiology. Work up negative so far. EEG monitoring completed and no epileptiform activity was noted. MRI was negative as well. Neurology was following and has signed off. No proteinuria, anemia improved, LFTs within normal limits, platelets within normal limits. There could be a psychiatric component. No further testing per neurology. Will get psychiatric input. 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   Normocytic Anemia  Stable.   Metabolic Acidosis  Improved. Etiology is unclear. Lactic acid was normal.   Postpartum Stable. Some vaginal spotting. No heavy bleeding. Her child is still in NICU. Was preterm.  Migraine headaches Pamelor per neurology. Mobilize.  DVT Prophylaxis: SCD's    Code Status: Full Code  Family Communication: Discussed with patient,  her husband Disposition Plan: Mobilize. PT/OT eval.    LOS: 2 days   [facility] Pager [phone] 08/15/2014, 12:37 PM  If 8PM-8AM, please contact night-coverage at [email or web address], password [email or web address]   Disclaimer: This note was dictated with voice recognition software. Similar sounding words can inadvertently be transcribed and may not be corrected upon review.

## 2014-08-15 NOTE — Evaluation (Addendum)
Physical Therapy Evaluation Patient Details Name: Penny [name] MRN: [id number] DOB: 11/19/1986 Today's Date: 08/15/2014   History of Present Illness  28 y.o. year old female with significant past medical history of preeclampsia, anxiety and depression, migranous disease  presenting with encephalopathy, headache. Level V caveat as pt is acutely altered. Primary history from husband who is EMT. Patient had a fairly complicated recent medical course including delivery at 35 weeks secondary to preeclampsia. Patient is noted to have complex migraines while at [facility] for preeclampsia.  However, patient had worsening migraines over the past 24 hours with noted questionable posturing at home.  Neuro following.   Clinical Impression  Pt adm due to above. Pt presents with generalized weakness that is affecting her functional mobility. Pt to benefit from skilled acute PT to address deficits and maximize functional mobility prior to returning home. Pt educated on HEP and was finished with exercises when sitting on side of the bed and she became non verbal and husband reported "this is just one of her episodes'. Pt was returned to supine safe position. Remained in flexed posture form for ~3 min with nursing notified, pt non verbal and eyes noted to be rolled back in head. Pt became verbal after ~3 min and nursing notified neuro.     Follow Up Recommendations No PT follow up;Supervision/Assistance - 24 hour    Equipment Recommendations  None recommended by PT    Recommendations for Other Services OT consult     Precautions / Restrictions Precautions Precautions: Fall Precaution Comments: seizure like activity Restrictions Weight Bearing Restrictions: No      Mobility  Bed Mobility Overal bed mobility: Modified Independent Bed Mobility: Supine to Sit     Supine to sit: Modified independent (Device/Increase time);HOB elevated     General bed mobility comments: utilizing handrails for  (A) and HOB was elevated  Transfers Overall transfer level: Needs assistance Equipment used: 1 person hand held assist Transfers: Sit to/from Stand Sit to Stand: Min guard         General transfer comment: pt reaching for UE support to stabilize with standing   Ambulation/Gait Ambulation/Gait assistance: Min guard Ambulation Distance (Feet): 50 Feet Assistive device: 1 person hand held assist Gait Pattern/deviations: Step-through pattern;Decreased stride length;Wide base of support;Shuffle Gait velocity: slow Gait velocity interpretation: Below normal speed for age/gender General Gait Details: pt unsteady with gt and required min guard with handheld (A) provided by significant other; no LOB noted but pt limited due to fatigue; reports 'my legs are really tired'   Stairs            Wheelchair Mobility    Modified Rankin (Stroke Patients Only)       Balance Overall balance assessment: Needs assistance Sitting-balance support: Feet supported;No upper extremity supported Sitting balance-Leahy Scale: Good     Standing balance support: During functional activity;Single extremity supported Standing balance-Leahy Scale: Poor Standing balance comment: required UE support to balance; pt unsteady                              Pertinent Vitals/Pain Pain Assessment: No/denies pain    Home Living Family/patient expects to be discharged to:: Private residence Living Arrangements: Spouse/significant other;Children Available Help at Discharge: Family;Available 24 hours/day Type of Home: House Home Access: Stairs to enter Entrance Stairs-Rails: Psychiatric nurse of Steps: 3 Home Layout: One level Home Equipment: Walker - 2 wheels      Prior  Function Level of Independence: Independent         Comments: pt with recent fall after child birth due to epidural complications      Hand Dominance        Extremity/Trunk Assessment   Upper  Extremity Assessment: Defer to OT evaluation           Lower Extremity Assessment: Generalized weakness      Cervical / Trunk Assessment: Normal  Communication   Communication: No difficulties  Cognition Arousal/Alertness: Awake/alert Behavior During Therapy: Flat affect Overall Cognitive Status: Impaired/Different from baseline Area of Impairment: Problem solving;Attention   Current Attention Level: Selective         Problem Solving: Slow processing;Requires verbal cues;Decreased initiation;Requires tactile cues General Comments: pt with delayed but appropriate responses; most recent "episode was 2 hours prior to PT session per husband.     General Comments General comments (skin integrity, edema, etc.): pt began to have one of her "episodes' see impression statement for details    Exercises General Exercises - Lower Extremity Ankle Circles/Pumps: AROM;Both;15 reps;Seated Long Arc Quad: AROM;Strengthening;Both;10 reps;Seated Hip Flexion/Marching: AROM;Strengthening;Both;10 reps;Seated Other Exercises Other Exercises: pt and husband requesting HEP for LE exercises; fatigues quickly       Assessment/Plan    PT Assessment Patient needs continued PT services  PT Diagnosis Generalized weakness;Difficulty walking   PT Problem List Decreased mobility;Decreased knowledge of use of DME;Impaired sensation;Decreased strength;Decreased balance;Decreased activity tolerance  PT Treatment Interventions DME instruction;Gait training;Stair training;Functional mobility training;Therapeutic activities;Therapeutic exercise;Patient/family education   PT Goals (Current goals can be found in the Care Plan section) Acute Rehab PT Goals Patient Stated Goal: to not have these episodes PT Goal Formulation: With patient Time For Goal Achievement: 08/22/14 Potential to Achieve Goals: Good    Frequency Min 3X/week   Barriers to discharge        Co-evaluation               End  of Session Equipment Utilized During Treatment: Gait belt Activity Tolerance: Patient limited by fatigue Patient left: in bed;with call bell/phone within reach;with nursing/sitter in room;with family/visitor present Nurse Communication: Mobility status;Precautions         Time: 6269-4854 PT Time Calculation (min): 30 min   Charges:   PT Evaluation $Initial PT Evaluation Tier I: 1 Procedure PT Treatments $Gait Training: 8-22 mins $Therapeutic Exercise: 8-22 mins   PT G CodesGustavus Bryant, [place]  [phone] 08/15/2014, 1:45 PM

## 2014-08-15 NOTE — Progress Notes (Signed)
Occupational Therapy Evaluation Patient Details Name: Penny [name] MRN: [id number] DOB: 11-10-1986 Today's Date: 08/15/2014    History of Present Illness Penny [name] is a 28 y.o. female admitted 08/13/14 with encephalopathy and HA. PMH of migraines, depression, anemia, ADD, anxiety and hx of gastric bypass (2007). Pt with  multiple spells concerning for seizure, however no epileptiform activity noted despite several episodes during long term monitoring. Of note, pt's baby is in the NICU and is causing stress to pt. 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    Clinical Impression   PTA pt lived at home and reports that she was independent with ADLs and functional mobility, however states that she did not get OOB much since her bedrest (at [redacted] weeks gestation) and has remained in bed for much of the time since her delivery. Pt presents with generalized weakness and deconditioning that is impairing her safety with ADLs and functional mobility. When preparing to ambulate, pt sitting EOB and became non-responsive. Pt's head turned to lateral flexion, Right arm in extensor tone, Left arm in flexion with hand/fingers in extension and Bil LEs in extensor pattern with toes curled. Pt remained in this posture for 2 min 20 seconds with RN notified. Pt nonverbal during this time with eyes closed and noted to be rolled back. Following activity, pt slowly became responsive and posturing slowly resolving (UEs then LEs). Pt would benefit from skilled acute OT to address increased safety at home with ADLs and functional mobility.     Follow Up Recommendations  No OT follow up;Supervision/Assistance - 24 hour    Equipment Recommendations  Tub/shower seat    Recommendations for Other Services       Precautions / Restrictions Precautions Precautions: Fall Precaution Comments: seizure like activity; particularly with effort or getting OOB Restrictions Weight  Bearing Restrictions: No      Mobility Bed Mobility Overal bed mobility: Modified Independent             General bed mobility comments: HOB flat, use of hand rails.   Transfers                 General transfer comment: Deferred due to safety at this time.          ADL Overall ADL's : Needs assistance/impaired Eating/Feeding: Independent;Sitting   Grooming: Set up;Sitting   Upper Body Bathing: Set up;Sitting       Upper Body Dressing : Set up;Sitting                     General ADL Comments: Pt seemed nervous/hesitant to get OOB and OT educated on deconditioning due to extended bedrest and purpose of therapy to increase strength through functional activities. Pt sat EOB and became slower to respond before having another "episode." Pt was laid in bed for safety and monitored during her episode and RN notified. Pt was able to reach socks in sitting prior to her episode and reports that earlier she was washing her hair in the bathroom sink.      Vision  Pt reports seeing "spots" for past 2 weeks. States that this occurs before her episodes.   No other apparent visual deficits. Will monitor.                  Perception Perception Perception Tested?: No   Praxis Praxis Praxis tested?: Within functional limits    Pertinent Vitals/Pain Pain Assessment: No/denies pain  Extremity/Trunk Assessment Upper Extremity Assessment Upper Extremity Assessment: Generalized weakness   Lower Extremity Assessment Lower Extremity Assessment: Generalized weakness   Cervical / Trunk Assessment Cervical / Trunk Assessment: Normal   Communication Communication Communication: No difficulties   Cognition Arousal/Alertness: Awake/alert Behavior During Therapy: Flat affect Overall Cognitive Status: Impaired/Different from baseline Area of Impairment: Problem solving;Attention   Current Attention Level: Selective         Problem Solving: Slow  processing;Requires verbal cues;Decreased initiation;Requires tactile cues General Comments: pt with delayed but appropriate responses. OT explained plan for session and began preparing room. When OT came to assist pt to move, pt reported feeling "spaced out." She became even slower to respond and sat EOB prior to having another "episode."      Exercises Exercises: Other exercises Other Exercises Other Exercises: encouraged pt to move UEs in bed, reach for ceiling, to build up muscle. Will plan to provide HEP and therabands.         Home Living Family/patient expects to be discharged to:: Private residence Living Arrangements: Spouse/significant other;Children Available Help at Discharge: Family;Available 24 hours/day Type of Home: House Home Access: Stairs to enter [employer] of Steps: 3 Entrance Stairs-Rails: Right;Left Home Layout: One level     Bathroom Shower/Tub: Occupational psychologist: Standard     Home Equipment: Environmental consultant - 2 wheels          Prior Functioning/Environment Level of Independence: Independent        Comments: pt with recent fall after child birth due to epidural complications     OT Diagnosis: Generalized weakness   OT Problem List: Impaired balance (sitting and/or standing);Decreased cognition;Decreased safety awareness;Decreased knowledge of use of DME or AE   OT Treatment/Interventions: Self-care/ADL training;Therapeutic exercise;Energy conservation;DME and/or AE instruction;Therapeutic activities;Patient/family education;Balance training    OT Goals(Current goals can be found in the care plan section) Acute Rehab OT Goals Patient Stated Goal: to not have these episodes OT Goal Formulation: With patient Time For Goal Achievement: 08/22/14 Potential to Achieve Goals: Good ADL Goals Pt Will Transfer to Toilet: Independently;ambulating Pt Will Perform Tub/Shower Transfer: Independently;ambulating Pt/caregiver will Perform  Home Exercise Program: Increased strength;Both right and left upper extremity;With written HEP provided;Independently;With theraband  OT Frequency: Min 1X/week    End of Session Nurse Communication: Other (comment) (pt having an "episode")  Activity Tolerance: Other (comment) (Treatment limited due to pt's "episodes" and safety concerns) Patient left: in bed;with call bell/phone within reach;with nursing/sitter in room   Time: 1349-1414 OT Time Calculation (min): 25 min Charges:  OT General Charges $OT Visit: 1 Procedure OT Evaluation $Initial OT Evaluation Tier I: 1 Procedure OT Treatments $Self Care/Home Management : 8-22 mins  Juluis Rainier [phone] 08/15/2014, 4:43 PM

## 2014-08-15 NOTE — Progress Notes (Addendum)
Lab Results: Basic Metabolic Panel:  Recent Labs Lab 08/13/14 0109 08/13/14 0254 08/13/14 0307 08/14/14 0340 08/15/14 0546  NA 142  --  140 141 140  K 3.2*  --  3.9 3.8 3.9  CL 108  --  107 107 104  CO2 17*  --  20 25 25   GLUCOSE 94  --  120* 92 89  BUN 11  --  13 17 11   CREATININE 0.44*  --  0.43* 0.50 0.45*  CALCIUM 8.9  --  8.6 8.3* 8.4  MG  --  2.2  --   --   --     Liver Function Tests:  Recent Labs Lab 08/13/14 0109 08/13/14 0307 08/14/14 0340  AST 21 20 27   ALT 19 18 20   ALKPHOS 118* 112 96  BILITOT 0.3 0.3 0.3  PROT 6.9 6.8 5.8*  ALBUMIN 3.4* 3.3* 2.8*   No results found for this basename: LIPASE, AMYLASE,  in the last 168 hours  Recent Labs Lab 08/13/14 0307  AMMONIA 22    CBC:  Recent Labs Lab 08/13/14 0109 08/13/14 0307 08/14/14 0340 08/15/14 0546  WBC 7.0 7.8 5.8 4.9  NEUTROABS 3.4 5.2 2.3  --   HGB 11.6* 11.0* 10.1* 9.9*  HCT 33.9* 32.0* 30.2* 29.7*  MCV 88.7 88.6 90.1 87.9  PLT 386 332 279 261    Cardiac Enzymes: No results found for this basename: CKTOTAL, CKMB, CKMBINDEX, TROPONINI,  in the last 168 hours  Lipid Panel: No results found for this basename: CHOL, TRIG, HDL, CHOLHDL, VLDL, LDLCALC,  in the last 168 hours  CBG: No results found for this basename: GLUCAP,  in the last 168 hours  Microbiology: Results for orders placed during the hospital encounter of 07/27/14  CULTURE, BETA STREP (GROUP B ONLY)     Status: None   Collection Time    07/27/14  6:45 PM      Result Value Ref Range Status   Specimen Description VAGINAL/RECTAL   Final   Special Requests NONE   Final   Culture     Final   Value: GROUP B STREP(S.AGALACTIAE)ISOLATED     Note: TESTING AGAINST S. AGALACTIAE NOT ROUTINELY PERFORMED DUE TO PREDICTABILITY OF AMP/PEN/VAN SUSCEPTIBILITY.     Performed at [facility]   Report Status 07/31/2014 FINAL   Final  GROUP B STREP BY PCR     Status: Abnormal   Collection Time    07/28/14  6:50 PM       Result Value Ref Range Status   Group B strep by PCR POSITIVE (*) NEGATIVE Final   Comment: RESULT CALLED TO, READ BACK BY AND VERIFIED WITH:     LASHLEY,K. AT 2002 ON 07/28/2014 BY HOUEGNIFIO M  OB RESULTS CONSOLE GBS     Status: None   Collection Time    07/28/14  6:50 PM      Result Value Ref Range Status   GBS Positive   Final  MRSA PCR SCREENING     Status: None   Collection Time    07/30/14  5:35 AM      Result Value Ref Range Status   MRSA by PCR NEGATIVE  NEGATIVE Final   Comment:            The GeneXpert MRSA Assay (FDA     approved for NASAL specimens     only), is one component of a     comprehensive MRSA colonization     surveillance program. It is  not     intended to diagnose MRSA     infection nor to guide or     monitor treatment for     MRSA infections.    Coagulation Studies: No results found for this basename: LABPROT, INR,  in the last 72 hours  Imaging: Mr Brain Wo Contrast  08/14/2014   CLINICAL DATA:  Assess for stroke. Two weeks postpartum for preeclampsia. Past history of complicated migraine, depression, irritable bowel syndrome, anxiety and gastric bypass.  EXAM: MRI HEAD WITHOUT CONTRAST  TECHNIQUE: Multiplanar, multiecho pulse sequences of the brain and surrounding structures were obtained without intravenous contrast.  COMPARISON:  CT of the head August 13, 2014 and MRI of the brain July 31, 2014  FINDINGS: The ventricles and sulci are normal. No abnormal parenchymal signal, mass lesions or mass of affect. No reduced diffusion to suggest acute ischemia. No susceptibility artifact to suggest hemorrhage.  No abnormal extra-axial fluid collection. No abnormal calvarial bone marrow signal. Ocular globes and orbital contents are unremarkable though not tailored for evaluation. Visualized paranasal sinuses and mastoid air cells appear well-aerated. No abnormal sellar expansion. Craniocervical junction is maintained.  IMPRESSION: No acute ischemia.  Normal MRI  of the head without contrast.   Electronically Signed   By: Elon Alas   On: 08/14/2014 22:24    Medications:  Scheduled: . clonazePAM  0.5 mg Oral BID  . escitalopram  20 mg Oral Daily  . nortriptyline  10 mg Oral QHS    Assessment/Plan: 28 YO female with multiple spells concerning for seizure. Patein had several episodes during long term monitoring with no epileptiform activity.  At this time would not start AED. As for patiens HA, she desires to breast feed and would recommend to continue Pamelor and tylenol for break through pain. Would recommend Psych evaluation.   No further neurological recommendations. Neurology S/O   Etta Quill PA-C [facility] [phone]  08/15/2014, 10:10 AM  Neurology attending: I had a long conversation with patient and husband regarding the results of video-EEG monitoring and MRI brain. 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 They were in disbelieve about the diagnosis of non epileptic seizures and asked me how to treat that type of seizures. I told them that anti-seizure medications don't work in this particular scenario and the help of psychiatry is essential to achieve seizure control.  In addition, I told them that I am not against obtaining a second opinion at an epilepsy center in an academic center. Patient stated that the migraine medications are not working at this moment and I proposed a trial of IV Depacon, Solumedrol, and phenergan. MRI brain was negative. Will follow up.  Patient seen and examined together with physician assistant and I concur with the assessment and plan.  Dorian Pod, MD

## 2014-08-16 DIAGNOSIS — F3289 Other specified depressive episodes: Secondary | ICD-10-CM

## 2014-08-16 DIAGNOSIS — F329 Major depressive disorder, single episode, unspecified: Secondary | ICD-10-CM

## 2014-08-16 MED ORDER — HYDROMORPHONE HCL PF 1 MG/ML IJ SOLN
0.5000 mg | Freq: Once | INTRAMUSCULAR | Status: AC
  Administered 2014-08-16: 0.5 mg via INTRAVENOUS
  Filled 2014-08-16: qty 1

## 2014-08-16 MED ORDER — HYDROMORPHONE HCL PF 1 MG/ML IJ SOLN
0.5000 mg | Freq: Four times a day (QID) | INTRAMUSCULAR | Status: DC | PRN
  Administered 2014-08-16: 0.5 mg via INTRAVENOUS
  Filled 2014-08-16: qty 1

## 2014-08-16 MED ORDER — HYDROMORPHONE HCL PF 1 MG/ML IJ SOLN
0.5000 mg | Freq: Four times a day (QID) | INTRAMUSCULAR | Status: DC | PRN
  Administered 2014-08-16: 1 mg via INTRAVENOUS
  Filled 2014-08-16: qty 1

## 2014-08-16 MED ORDER — MORPHINE SULFATE 2 MG/ML IJ SOLN
2.0000 mg | Freq: Once | INTRAMUSCULAR | Status: AC
  Administered 2014-08-17: 2 mg via INTRAVENOUS
  Filled 2014-08-16: qty 1

## 2014-08-16 MED ORDER — POLYETHYLENE GLYCOL 3350 17 G PO PACK
17.0000 g | PACK | Freq: Every day | ORAL | Status: DC
  Administered 2014-08-16 – 2014-08-17 (×2): 17 g via ORAL
  Filled 2014-08-16 (×2): qty 1

## 2014-08-16 MED ORDER — POLYETHYLENE GLYCOL 3350 17 G PO PACK: 17.0000 g | PACK | Freq: Every day | ORAL | Status: DC | PRN

## 2014-08-16 MED ORDER — SENNOSIDES-DOCUSATE SODIUM 8.6-50 MG PO TABS: 2.0000 | ORAL_TABLET | Freq: Every evening | ORAL | Status: DC | PRN

## 2014-08-16 MED ORDER — VALPROATE SODIUM 500 MG/5ML IV SOLN
500.0000 mg | Freq: Three times a day (TID) | INTRAVENOUS | Status: DC
  Administered 2014-08-16 – 2014-08-17 (×4): 500 mg via INTRAVENOUS
  Filled 2014-08-16 (×6): qty 5

## 2014-08-16 MED ORDER — MORPHINE SULFATE 2 MG/ML IJ SOLN
2.0000 mg | Freq: Four times a day (QID) | INTRAMUSCULAR | Status: DC | PRN
  Administered 2014-08-16 – 2014-08-17 (×3): 4 mg via INTRAVENOUS
  Filled 2014-08-16: qty 1
  Filled 2014-08-16 (×2): qty 2
  Filled 2014-08-16: qty 1

## 2014-08-16 NOTE — Progress Notes (Addendum)
Pt informed RN that her pain was not controlled. Stated she could go home and be in this amount of pain and still be able to go see her baby. Pt friend stated that pt needed to be "given enough medications that she could sleep so her body could reset itself". RN informed pt that pt could not be sedated because then she could not participate in therapy etc. Pt stated she needed to be sedated and "that the doctors dont care that I am in pain". RN informed pt that the MD would be notified. MD paged awaiting return call.

## 2014-08-16 NOTE — Progress Notes (Signed)
Occupational Therapy Treatment Patient Details Name: Penny [name] MRN: [id number] DOB: 04/19/1986 Today's Date: 08/16/2014    History of present illness Melisa L. Higbie is a 28 y.o. female admitted 08/13/14 with encephalopathy and HA. PMH of migraines, depression, anemia, ADD, anxiety and hx of gastric bypass (2007). Pt with  multiple spells concerning for seizure, however no epileptiform activity noted despite several episodes during long term monitoring. Of note, pt's baby is in the NICU and is causing stress to pt. 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    OT comments  Pt seen for therapeutic exercise to increase strength and conditioning to promote independence with ADLs. Provided and educated pt on HEP and use of theraband with pt reported understanding.    Follow Up Recommendations  No OT follow up;Supervision/Assistance - 24 hour    Equipment Recommendations  Tub/shower seat    Recommendations for Other Services      Precautions / Restrictions Precautions Precautions: Fall Precaution Comments: seizure-like activity following ambulation Restrictions Weight Bearing Restrictions: No              ADL Overall ADL's : Needs assistance/impaired                                       General ADL Comments: Pt seen for UE strengthening. Reinforced safety education and importance of muscle growth to support pt movement. Encouraged pt to slowly increase activity as episodes allow.                 Cognition  Arousal/Alertness: Awake/Alert Behavior During Therapy: Flat affect Overall Cognitive Status: Impaired/Different from baseline Area of Impairment: Problem solving;Attention   Current Attention Level: Selective          Problem Solving: Slow processing;Requires verbal cues;Decreased initiation;Requires tactile cues General Comments: pt seemed more alert this afternoon but continues to be slow to answer  questions      Exercises Other Exercises Other Exercises: Provided pt with HEP and level 2 (red) theraband for increased strengthening of BUEs. Educated and demonstrated use of theraband and performed activities. Pt reports understanding.           Pertinent Vitals/ Pain       Pain Assessment: Faces Faces Pain Scale: Hurts even more Pain Location: Lt leg and HA Pain Descriptors / Indicators: Throbbing Pain Intervention(s): Limited activity within patient's tolerance;Monitored during session;Repositioned         Frequency Min 1X/week     Progress Toward Goals  OT Goals(current goals can now be found in the care plan section)  Progress towards OT goals: Progressing toward goals (pt indicated understanding of ther ex)  Acute Rehab OT Goals Patient Stated Goal: to not have these episodes OT Goal Formulation: With patient Time For Goal Achievement: 08/22/14 Potential to Achieve Goals: Good ADL Goals Pt Will Transfer to Toilet: Independently;ambulating Pt Will Perform Tub/Shower Transfer: Independently;ambulating Pt/caregiver will Perform Home Exercise Program: Increased strength;Both right and left upper extremity;With written HEP provided;Independently;With theraband  Plan Discharge plan remains appropriate    Co-evaluation      Reason for Co-Treatment: For patient/therapist safety PT goals addressed during session: Mobility/safety with mobility;Proper use of DME;Balance        End of Session Equipment Utilized During Treatment: Other (comment) (theraband (level 2 red))   Activity Tolerance Patient limited by fatigue   Patient Left in bed;with call bell/phone  within reach;with family/visitor present   Nurse Communication          Time: 9562-1308 OT Time Calculation (min): 23 min  Charges: OT General Charges $OT Visit: 1 Procedure OT Treatments $Therapeutic Activity: 8-22 mins $Therapeutic Exercise: 8-22 mins  Juluis Rainier [phone] 08/16/2014, 5:21  PM

## 2014-08-16 NOTE — Consult Note (Signed)
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 Darrow Bussing [name] is an 28 y.o. female. Total Time spent with patient: 45 minutes  Assessment: AXIS I:  Depressive Disorder NOS- consider Depression secondary to Chronic Pain, versus Post Partum Depression,  consider Conversion Disorder  AXIS II:  Deferred AXIS III:   Past Medical History  Diagnosis Date  . Migraines   . Interstitial cystitis   . IUD FEB 2010  . Gastritis JULY 2011  . Depression   . 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 Fatty liver   . Obesity (BMI 30-39.9) 2011 228 LBS BMI 36.8  . Iron deficiency anemia 07/23/2010  . Irritable bowel syndrome 2012 DIARRHEA    JUN 2012 TTG IgA 14.9  . Elevated liver enzymes JUL 2011ALK PHOS 111-127 AST  143-267 ALT  213-321T BILI 0.6  ALB  3.7-4.06 Jun 2011 ALK PHOS 118 AST 24 ALT 42 T BILI 0.4 ALB 3.9  . Chronic daily headache   . Ovarian cyst   . PONV (postoperative nausea and vomiting)   . ADD (attention deficit disorder)   . RLQ abdominal pain 07/18/2013  . Menorrhagia 07/18/2013  . Anxiety   . Potassium (K) deficiency   . Dysmenorrhea 09/18/2013  . Stress 09/18/2013  . Patient desires pregnancy 09/18/2013  . Pregnant 12/25/2013  . Blood transfusion without reported diagnosis   . Heart murmur   . Gastric bypass status for obesity    AXIS IV:  off work for seeveral months, new born child in NICU, chronic Pain, currently exacerbated AXIS V:  41-50 serious symptoms  Plan:  No evidence of imminent risk to self or others at present.   Patient does not meet criteria for psychiatric inpatient admission. See below  Subjective:   Penny [name] is a 28 y.o. female patient admitted with  History of complex migraines/chronic pain, recent preeclampsia, seizure like episodes.  HPI:   ( I met with patient- husband and friend at bedside , provided collateral information) 28 year old female,  who is about two weeks post partum. Delivery at 35 weeks due to preeclampsia. Recently started having seizure like episodes, consisting of loss of consciousness and as described by patient and family tonic contractions of neck , face, legs, and extension of arms outward. These last several minutes. She does not remember then but does state there is an aura. She has had no incontinence, no tongue biting, but does state she feels confused and very tired after they are over. She also feelsgeneralized pain, particularly after episodes. She has had several of these. She has had  Video monitored EEG and episodes were found not to correspond with EEG abnormalities.  Other symptoms include severe exacerbation of her migraine/headache, with excruciating pain when standing up or moving in particular. She describes a history of chronic migraines which have been relapsing and recurring but recently very severe. Patient describes a history of depression, anxiety ( tendency to worry and panic attacks at times) and a history of opiate dependence ( initially prescribed due to bladder disease/pain) ,for which she seeked treatment a few years ago. Denies any relapses except for short term use of opiates after  Pregnancy/ difficult delivery. She denies any history of psychosis, no history of psychosis, no history of suicide attempts. Patient states that she is currently depressed, and attributes this to pain. She also reports feeling frustrated  Because she feels her  pain is being under-treated due to concern about her opiate dependence history. She denies suicidal  Ideations, she is not homicidal , and she is not presenting with any psychotic symptoms. She is future oriented and is hoping to go home soon, and reunite with family and child. HPI Elements:  Current severe pain , migraines, underlying chronic history of migraine headaches, recent pre-eclampsia, new onset seizure like episodes.  Past Psychiatric History: Past  Medical History  Diagnosis Date  . Migraines   . Interstitial cystitis   . IUD FEB 2010  . Gastritis JULY 2011  . Depression   . 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 Fatty liver   . Obesity (BMI 30-39.9) 2011 228 LBS BMI 36.8  . Iron deficiency anemia 07/23/2010  . Irritable bowel syndrome 2012 DIARRHEA    JUN 2012 TTG IgA 14.9  . Elevated liver enzymes JUL 2011ALK PHOS 111-127 AST  143-267 ALT  213-321T BILI 0.6  ALB  3.7-4.06 Jun 2011 ALK PHOS 118 AST 24 ALT 42 T BILI 0.4 ALB 3.9  . Chronic daily headache   . Ovarian cyst   . PONV (postoperative nausea and vomiting)   . ADD (attention deficit disorder)   . RLQ abdominal pain 07/18/2013  . Menorrhagia 07/18/2013  . Anxiety   . Potassium (K) deficiency   . Dysmenorrhea 09/18/2013  . Stress 09/18/2013  . Patient desires pregnancy 09/18/2013  . Pregnant 12/25/2013  . Blood transfusion without reported diagnosis   . Heart murmur   . Gastric bypass status for obesity     reports that she quit smoking about 7 months ago. Her smoking use included Cigarettes. She smoked 0.00 packs per day for 5 years. She has never used smokeless tobacco. She reports that she does not drink alcohol or use illicit drugs. Family History  Problem Relation Age of Onset  . Hemochromatosis Maternal Grandmother   . Migraines Maternal Grandmother   . Cancer Maternal Grandmother   . Hypertension Father   . Diabetes Father   . Coronary artery disease Father   . Migraines Paternal Grandmother   . Cancer Mother     breast  . Hemochromatosis Mother   . Coronary artery disease Paternal Grandfather      Living Arrangements: Spouse/significant other;Children   Abuse/Neglect [facility]) Physical Abuse: Denies Verbal Abuse: Denies Sexual Abuse: Denies Allergies:   Allergies  Allergen Reactions  . Gabapentin Other (See Comments)    Pt states that she was unresponsive after taking this medication, but her vitals remained stable.    . Metoclopramide Hcl  Anxiety and Other (See Comments)    Pt states that she felt like she was trapped in a box, and could not get out.  Pt also states that she had temporary loss of movement, weakness, and tingling.    . Topamax [Topiramate] Shortness Of Breath and Other (See Comments)    Adverse side effects.  Pt states this medication changed her taste for a short period of time and caused numbness and tingling in extremities.    . Codeine Nausea And Vomiting  . Latex Rash  . Tape Itching and Rash     Objective: Blood pressure 128/74, pulse 90, temperature 98.7 F (37.1 C), temperature source Oral, resp. rate 18, height $RemoveBe'5\' 7"'jOxRrqfkx$  (1.702 m), weight 97.932 kg (215 lb 14.4 oz), SpO2 96.00%, unknown if currently breastfeeding.Body mass index is 33.81 kg/(m^2). Results for orders placed during the  hospital encounter of 08/13/14 (from the past 72 hour(s))  COMPREHENSIVE METABOLIC PANEL     Status: Abnormal   Collection Time    08/14/14  3:40 AM      Result Value Ref Range   Sodium 141  137 - 147 mEq/L   Potassium 3.8  3.7 - 5.3 mEq/L   Chloride 107  96 - 112 mEq/L   CO2 25  19 - 32 mEq/L   Glucose, Bld 92  70 - 99 mg/dL   BUN 17  6 - 23 mg/dL   Creatinine, Ser 8.02  0.50 - 1.10 mg/dL   Calcium 8.3 (*) 8.4 - 10.5 mg/dL   Total Protein 5.8 (*) 6.0 - 8.3 g/dL   Albumin 2.8 (*) 3.5 - 5.2 g/dL   AST 27  0 - 37 U/L   ALT 20  0 - 35 U/L   Alkaline Phosphatase 96  39 - 117 U/L   Total Bilirubin 0.3  0.3 - 1.2 mg/dL   GFR calc non Af Amer >90  >90 mL/min   GFR calc Af Amer >90  >90 mL/min   Comment: (NOTE)     The eGFR has been calculated using the CKD EPI equation.     This calculation has not been validated in all clinical situations.     eGFR's persistently <90 mL/min signify possible Chronic Kidney     Disease.   Anion gap 9  5 - 15  CBC WITH DIFFERENTIAL     Status: Abnormal   Collection Time    08/14/14  3:40 AM      Result Value Ref Range   WBC 5.8  4.0 - 10.5 K/uL   RBC 3.35 (*) 3.87 - 5.11 MIL/uL    Hemoglobin 10.1 (*) 12.0 - 15.0 g/dL   HCT 87.2 (*) 10.1 - 36.5 %   MCV 90.1  78.0 - 100.0 fL   MCH 30.1  26.0 - 34.0 pg   MCHC 33.4  30.0 - 36.0 g/dL   RDW 78.4  29.2 - 77.2 %   Platelets 279  150 - 400 K/uL   Neutrophils Relative % 40 (*) 43 - 77 %   Neutro Abs 2.3  1.7 - 7.7 K/uL   Lymphocytes Relative 45  12 - 46 %   Lymphs Abs 2.6  0.7 - 4.0 K/uL   Monocytes Relative 10  3 - 12 %   Monocytes Absolute 0.6  0.1 - 1.0 K/uL   Eosinophils Relative 5  0 - 5 %   Eosinophils Absolute 0.3  0.0 - 0.7 K/uL   Basophils Relative 0  0 - 1 %   Basophils Absolute 0.0  0.0 - 0.1 K/uL  CBC     Status: Abnormal   Collection Time    08/15/14  5:46 AM      Result Value Ref Range   WBC 4.9  4.0 - 10.5 K/uL   RBC 3.38 (*) 3.87 - 5.11 MIL/uL   Hemoglobin 9.9 (*) 12.0 - 15.0 g/dL   HCT 62.4 (*) 20.9 - 11.5 %   MCV 87.9  78.0 - 100.0 fL   MCH 29.3  26.0 - 34.0 pg   MCHC 33.3  30.0 - 36.0 g/dL   RDW 35.1  48.2 - 59.8 %   Platelets 261  150 - 400 K/uL  BASIC METABOLIC PANEL     Status: Abnormal   Collection Time    08/15/14  5:46 AM      Result Value Ref  Range   Sodium 140  137 - 147 mEq/L   Potassium 3.9  3.7 - 5.3 mEq/L   Chloride 104  96 - 112 mEq/L   CO2 25  19 - 32 mEq/L   Glucose, Bld 89  70 - 99 mg/dL   BUN 11  6 - 23 mg/dL   Creatinine, Ser 0.45 (*) 0.50 - 1.10 mg/dL   Calcium 8.4  8.4 - 10.5 mg/dL   GFR calc non Af Amer >90  >90 mL/min   GFR calc Af Amer >90  >90 mL/min   Comment: (NOTE)     The eGFR has been calculated using the CKD EPI equation.     This calculation has not been validated in all clinical situations.     eGFR's persistently <90 mL/min signify possible Chronic Kidney     Disease.   Anion gap 11  5 - 15   Labs are reviewed and are pertinent for anemia  Current Facility-Administered Medications  Medication Dose Route Frequency Provider Last Rate Last Dose  . 0.9 %  sodium chloride infusion   Intravenous Continuous Bonnielee Haff, MD 10 mL/hr at 08/16/14 0811     . clonazePAM (KLONOPIN) tablet 0.5 mg  0.5 mg Oral BID Bonnielee Haff, MD   0.5 mg at 08/16/14 0933  . escitalopram (LEXAPRO) tablet 20 mg  20 mg Oral Daily Bonnielee Haff, MD   20 mg at 08/16/14 0933  . HYDROcodone-acetaminophen (NORCO/VICODIN) 5-325 MG per tablet 1 tablet  1 tablet Oral Q4H PRN Bonnielee Haff, MD   1 tablet at 08/16/14 1236  . HYDROmorphone (DILAUDID) injection 0.5 mg  0.5 mg Intravenous Q6H PRN Bonnielee Haff, MD   0.5 mg at 08/16/14 1120  . methocarbamol (ROBAXIN) 500 mg in dextrose 5 % 50 mL IVPB  500 mg Intravenous Q6H Marliss Coots, PA-C   500 mg at 08/16/14 1322  . methylPREDNISolone sodium succinate (SOLU-MEDROL) 1,000 mg in sodium chloride 0.9 % 50 mL IVPB  1,000 mg Intravenous Daily Marliss Coots, PA-C   1,000 mg at 08/16/14 0258  . naproxen (NAPROSYN) tablet 500 mg  500 mg Oral BID WC Marliss Coots, PA-C   500 mg at 08/16/14 5277  . nortriptyline (PAMELOR) capsule 10 mg  10 mg Oral QHS Bonnielee Haff, MD   10 mg at 08/15/14 2129  . pantoprazole (PROTONIX) EC tablet 40 mg  40 mg Oral Q0600 Marliss Coots, PA-C   40 mg at 08/16/14 0636  . polyethylene glycol (MIRALAX / GLYCOLAX) packet 17 g  17 g Oral Daily Bonnielee Haff, MD   17 g at 08/16/14 0933  . promethazine (PHENERGAN) injection 12.5 mg  12.5 mg Intravenous Q6H PRN Wallie Char   12.5 mg at 08/16/14 1125  . senna-docusate (Senokot-S) tablet 2 tablet  2 tablet Oral QHS PRN Gardiner Barefoot, NP      . valproate (DEPACON) 500 mg in dextrose 5 % 50 mL IVPB  500 mg Intravenous 3 times per day Marliss Coots, PA-C   500 mg at 08/16/14 1120    Psychiatric Specialty Exam:     Blood pressure 128/74, pulse 90, temperature 98.7 F (37.1 C), temperature source Oral, resp. rate 18, height $RemoveBe'5\' 7"'PMcZqyZtn$  (1.702 m), weight 97.932 kg (215 lb 14.4 oz), SpO2 96.00%, unknown if currently breastfeeding.Body mass index is 33.81 kg/(m^2).  General Appearance: Fairly Groomed  Engineer, water::  Good  Speech:  Slow  Volume:  Decreased   Mood:  Depressed and slightly irritable  Affect:  Constricted  Thought Process:  Goal Directed and Linear  Orientation:  Other:  fully alert and attentive  Thought Content:  no hallucinations, no delusions  Suicidal Thoughts:  No- denies any current suicidal or homicidal ideations  Homicidal Thoughts:  No  Memory:  remote and recent grossly intact  Judgement:  Fair  Insight:  Fair  Psychomotor Activity:  Decreased  Concentration:  Good  Recall:  Good  Fund of Knowledge:Good  Language: Good  Akathisia:  Negative  Handed:  Right  AIMS (if indicated):     Assets:  Communication Skills Desire for Improvement Social Support  Sleep:      Musculoskeletal: Strength & Muscle Tone: abnormal Gait & [name]: difficulty ambulating due to pain, as per her description, reports fear of elliciting episodes by walking Patient leans: N/A  Treatment Plan Summary: ( At this time there are no  grounds for involuntary commitment or psychiatric admission)  Agree with Lexapro 20 mgrs a day. (Another option would be Cymbalta trial, as this antidepressant may also help pain.) Agree with Klonopin 0.5 mgrs BID Would monitor Nortiptyline trial to insure it is not contributing to perceived weakness, anxiety or causing anticholinergic side effects.  Continue to manage current severe pain/ migraine as aggressively as possible. Would use opiate analgesics as needed  within safe parameters to help alleviate  Her current severe pain, but would try to minimize or avoid outpatient or open ended opiate use.   Patient will likely benefit from ongoing  Outpatient psychiatric management and support after discharge.  Neita Garnet 08/16/2014 4:13 PM

## 2014-08-16 NOTE — Progress Notes (Signed)
Charge RN called into the room by PT/OT for another "episode" (per friend).  Upon entering, pt seemed to be experiencing what looked like decerebrate posturing with RUE only, and having rhythmic movements of the head and neck toward the R side, eyes closed and not responsive to verbal stimuli.  Episode lasted appx 5 minutes, afterward patient was A&O x4, followed commands, and could localize with her eyes.  RN aware, RR RN made aware upon their rounds of the unit.

## 2014-08-16 NOTE — Progress Notes (Signed)
[facility] PROGRESS NOTE  Penny [name] SWF:[id number] DOB: [birth year] DOA: 08/13/2014  PCP: [name], MD  Brief HPI: 28yo with PMH as below who is post partum with history of preeclampsia and who presented with headaches with questionable posturing at home. No GTC activity. Patient seen by neurology. She underwent continuous EEG. No epileptiform activity was noted. She continues to have these posturing spells. 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 Psychiatry consult called.  Past medical history:  Past Medical History  Diagnosis Date  . Migraines   . Interstitial cystitis   . IUD FEB 2010  . Gastritis JULY 2011  . Depression   . 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 Fatty liver   . Obesity (BMI 30-39.9) 2011 228 LBS BMI 36.8  . Iron deficiency anemia 07/23/2010  . Irritable bowel syndrome 2012 DIARRHEA    JUN 2012 TTG IgA 14.9  . Elevated liver enzymes JUL 2011ALK PHOS 111-127 AST  143-267 ALT  213-321T BILI 0.6  ALB  3.7-4.06 Jun 2011 ALK PHOS 118 AST 24 ALT 42 T BILI 0.4 ALB 3.9  . Chronic daily headache   . Ovarian cyst   . PONV (postoperative nausea and vomiting)   . ADD (attention deficit disorder)   . RLQ abdominal pain 07/18/2013  . Menorrhagia 07/18/2013  . Anxiety   . Potassium (K) deficiency   . Dysmenorrhea 09/18/2013  . Stress 09/18/2013  . Patient desires pregnancy 09/18/2013  . Pregnant 12/25/2013  . Blood transfusion without reported diagnosis   . Heart murmur   . Gastric bypass status for obesity     Consultants: Neurology, Psychiatry  Procedures: Continuous EEG: No epileptiform acitivity  Antibiotics: None  Subjective: Patient feels same. Continues to have these posturing spells on and off. Still has left headache now with stabbing sensation. Was able to ambulate yesterday using IV pole.  Objective: Vital Signs  Filed Vitals:   08/16/14 0034 08/16/14 0637 08/16/14 0949 08/16/14 1059  BP: 121/76 118/63 113/64 131/75  Pulse: 83  67 81 83  Temp: 98.8 F (37.1 C) 97.7 F (36.5 C) 98.2 F (36.8 C)   TempSrc: Oral Oral Oral   Resp: $Remo'20 18 18 22  'agEEK$ Height:      Weight:      SpO2: 100%  100% 98%    Intake/Output Summary (Last 24 hours) at 08/16/14 1137 Last data filed at 08/15/14 1849  Gross per 24 hour  Intake    480 ml  Output      0 ml  Net    480 ml   Filed Weights   08/13/14 0055 08/13/14 0535  Weight: 99.791 kg (220 lb) 97.932 kg (215 lb 14.4 oz)    General appearance: alert, cooperative, appears stated age and no distress Resp: clear to auscultation bilaterally Cardio: regular rate and rhythm, S1, S2 normal, no murmur, click, rub or gallop GI: soft, non-tender; bowel sounds normal; no masses,  no organomegaly Extremities: extremities normal, atraumatic, no cyanosis or edema Neurologic: Slight weakness on left side. No facial assymetry. No change in examination.  Lab Results:  Basic Metabolic Panel:  Recent Labs Lab 08/13/14 0109 08/13/14 0254 08/13/14 0307 08/14/14 0340 08/15/14 0546  NA 142  --  140 141 140  K 3.2*  --  3.9 3.8 3.9  CL 108  --  107 107 104  CO2 17*  --  $R'20 25 25  'Wg$ GLUCOSE 94  --  120* 92 89  BUN  11  --  $R'13 17 11  'aD$ CREATININE 0.44*  --  0.43* 0.50 0.45*  CALCIUM 8.9  --  8.6 8.3* 8.4  MG  --  2.2  --   --   --    Liver Function Tests:  Recent Labs Lab 08/13/14 0109 08/13/14 0307 08/14/14 0340  AST $Re'21 20 27  'Cxg$ ALT $R'19 18 20  'zK$ ALKPHOS 118* 112 96  BILITOT 0.3 0.3 0.3  PROT 6.9 6.8 5.8*  ALBUMIN 3.4* 3.3* 2.8*    Recent Labs Lab 08/13/14 0307  AMMONIA 22   CBC:  Recent Labs Lab 08/13/14 0109 08/13/14 0307 08/14/14 0340 08/15/14 0546  WBC 7.0 7.8 5.8 4.9  NEUTROABS 3.4 5.2 2.3  --   HGB 11.6* 11.0* 10.1* 9.9*  HCT 33.9* 32.0* 30.2* 29.7*  MCV 88.7 88.6 90.1 87.9  PLT 386 332 279 261     Studies/Results: Mr Brain Wo Contrast  08/14/2014   CLINICAL DATA:  Assess for stroke. Two weeks postpartum for preeclampsia. Past history of complicated migraine,  depression, irritable bowel syndrome, anxiety and gastric bypass.  EXAM: MRI HEAD WITHOUT CONTRAST  TECHNIQUE: Multiplanar, multiecho pulse sequences of the brain and surrounding structures were obtained without intravenous contrast.  COMPARISON:  CT of the head August 13, 2014 and MRI of the brain July 31, 2014  FINDINGS: The ventricles and sulci are normal. No abnormal parenchymal signal, mass lesions or mass of affect. No reduced diffusion to suggest acute ischemia. No susceptibility artifact to suggest hemorrhage.  No abnormal extra-axial fluid collection. No abnormal calvarial bone marrow signal. Ocular globes and orbital contents are unremarkable though not tailored for evaluation. Visualized paranasal sinuses and mastoid air cells appear well-aerated. No abnormal sellar expansion. Craniocervical junction is maintained.  IMPRESSION: No acute ischemia.  Normal MRI of the head without contrast.   Electronically Signed   By: Elon Alas   On: 08/14/2014 22:24    Medications:  Scheduled: . clonazePAM  0.5 mg Oral BID  . escitalopram  20 mg Oral Daily  . methocarbamol (ROBAXIN) IV  500 mg Intravenous Q6H  . methylPREDNISolone (SOLU-MEDROL) injection  1,000 mg Intravenous Daily  . naproxen  500 mg Oral BID WC  . nortriptyline  10 mg Oral QHS  . pantoprazole  40 mg Oral Q0600  . polyethylene glycol  17 g Oral Daily  . valproate sodium  500 mg Intravenous 3 times per day   Continuous: . sodium chloride 10 mL/hr at 08/16/14 4401   [id number], HYDROmorphone (DILAUDID) injection, promethazine, senna-docusate  Assessment/Plan:  Active Problems:   Encephalopathy   Headache(784.0)   Altered mental status    Acute Encephalopathy/Unclear Spells No clear etiology. Work up negative so far. EEG monitoring completed and no epileptiform activity was noted. MRI was negative as well. Neurology was following and has signed off. No proteinuria, anemia improved, LFTs within  normal limits, platelets within normal limits. There could be a psychiatric component. No further testing per neurology. Await psychiatric input. 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 This is a very difficult situation. Will wait to see if her symptoms improve with control of her headaches. Topic of second opinion was raised by neurology. Will wait for psyche input for now and see if her headaches improve with treatment initiated by neurology. Then if family wants we can contact [facility].  Migraine headaches Started on Depakote by neuro. Also given high dose steroids. On Nortriptyline as well.   Normocytic Anemia  Stable.  Metabolic Acidosis  Resolved. Etiology is unclear. Lactic acid was normal.   Postpartum Stable. Some vaginal spotting. No heavy bleeding. Her child is still in NICU. Was preterm.   DVT Prophylaxis: SCD's    Code Status: Full Code  Family Communication: Discussed with patient, her husband Disposition Plan: Mobilize. PT/OT eval.    LOS: 3 days   [facility] Pager [phone] 08/16/2014, 11:37 AM  If 8PM-8AM, please contact night-coverage at [email or web address], password [email or web address]   Disclaimer: This note was dictated with voice recognition software. Similar sounding words can inadvertently be transcribed and may not be corrected upon review.

## 2014-08-16 NOTE — Progress Notes (Signed)
Called into due to episode of "seizures" lasting 1725-1728. Family reports taking Pt to bathroom, change gown, and returned to bed prior to episode. Observed having fists inward bilaterally, making sound synchronized sounding with head movement; intermittent eyes opening with no sounding when talked to, then return to prior state. RUE fist released first followed by LUE few seconds later. Able to conversate, A/O X4, right after episode.in the setting of no post ictal period. Dr. Curly Rim paged and notified.

## 2014-08-16 NOTE — Progress Notes (Signed)
UR complete.  Hagan Vanauken RN, MSN 

## 2014-08-16 NOTE — Progress Notes (Signed)
RN called to room. Pt having "episode". Pt appeared to be experiencing decerebrate posturing of entire body. Lasted approximately one minute.

## 2014-08-16 NOTE — Progress Notes (Signed)
Subjective: Patient continues to have a 9/10 HA and now stating she has left leg pain that radiates from her buttocks to her out foot.   Objective: Current vital signs: BP 131/75  Pulse 83  Temp(Src) 98.2 F (36.8 C) (Oral)  Resp 22  Ht 5\' 7"  (1.702 m)  Wt 97.932 kg (215 lb 14.4 oz)  BMI 33.81 kg/m2  SpO2 98% Vital signs in last 24 hours: Temp:  [97.7 F (36.5 C)-98.8 F (37.1 C)] 98.2 F (36.8 C) (09/11 0949) Pulse Rate:  [67-99] 83 (09/11 1059) Resp:  [18-22] 22 (09/11 1059) BP: (113-138)/(63-88) 131/75 mmHg (09/11 1059) SpO2:  [97 %-100 %] 98 % (09/11 1059)  Intake/Output from previous day: 09/10 0701 - 09/11 0700 In: 720 [P.O.:720] Out: -  Intake/Output this shift:   Nutritional status: General  Neurologic Exam: General:NAD Mental Status: Alert, oriented, thought content appropriate.  Speech fluent without evidence of aphasia.  Able to follow 3 step commands without difficulty. Cranial Nerves: II: Discs flat bilaterally; Visual fields grossly normal, pupils equal, round, reactive to light and accommodation III,IV, VI: ptosis not present, extra-ocular motions intact bilaterally V,VII: smile symmetric, facial light touch sensation normal bilaterally VIII: hearing normal bilaterally IX,X: gag reflex present XI: bilateral shoulder shrug XII: midline tongue extension without atrophy or fasciculations  Motor: Right : Upper extremity   5/5    Left:     Upper extremity   5/5  Lower extremity   5/5     Lower extremity   5/5 Tone and bulk:normal tone throughout; no atrophy noted Sensory: Pinprick and light touch intact throughout, bilaterally Deep Tendon Reflexes:  Right: Upper Extremity   Left: Upper extremity   biceps (C-5 to C-6) 2/4   biceps (C-5 to C-6) 2/4 tricep (C7) 2/4    triceps (C7) 2/4 Brachioradialis (C6) 2/4  Brachioradialis (C6) 2/4  Lower Extremity Lower Extremity  quadriceps (L-2 to L-4) 2/4   quadriceps (L-2 to L-4) 2/4 Achilles (S1)  2/4   Achilles (S1) 2/4  Plantars: Right: downgoing   Left: downgoing Cerebellar: normal finger-to-nose,  normal heel-to-shin test Gait: normal CV: pulses palpable throughout    Lab Results: Basic Metabolic Panel:  Recent Labs Lab 08/13/14 0109 08/13/14 0254 08/13/14 0307 08/14/14 0340 08/15/14 0546  NA 142  --  140 141 140  K 3.2*  --  3.9 3.8 3.9  CL 108  --  107 107 104  CO2 17*  --  20 25 25   GLUCOSE 94  --  120* 92 89  BUN 11  --  13 17 11   CREATININE 0.44*  --  0.43* 0.50 0.45*  CALCIUM 8.9  --  8.6 8.3* 8.4  MG  --  2.2  --   --   --     Liver Function Tests:  Recent Labs Lab 08/13/14 0109 08/13/14 0307 08/14/14 0340  AST 21 20 27   ALT 19 18 20   ALKPHOS 118* 112 96  BILITOT 0.3 0.3 0.3  PROT 6.9 6.8 5.8*  ALBUMIN 3.4* 3.3* 2.8*   No results found for this basename: LIPASE, AMYLASE,  in the last 168 hours  Recent Labs Lab 08/13/14 0307  AMMONIA 22    CBC:  Recent Labs Lab 08/13/14 0109 08/13/14 0307 08/14/14 0340 08/15/14 0546  WBC 7.0 7.8 5.8 4.9  NEUTROABS 3.4 5.2 2.3  --   HGB 11.6* 11.0* 10.1* 9.9*  HCT 33.9* 32.0* 30.2* 29.7*  MCV 88.7 88.6 90.1 87.9  PLT 386 332 279 261  Cardiac Enzymes: No results found for this basename: CKTOTAL, CKMB, CKMBINDEX, TROPONINI,  in the last 168 hours  Lipid Panel: No results found for this basename: CHOL, TRIG, HDL, CHOLHDL, VLDL, LDLCALC,  in the last 168 hours  CBG: No results found for this basename: GLUCAP,  in the last 168 hours  Microbiology: Results for orders placed during the hospital encounter of 07/27/14  CULTURE, BETA STREP (GROUP B ONLY)     Status: None   Collection Time    07/27/14  6:45 PM      Result Value Ref Range Status   Specimen Description VAGINAL/RECTAL   Final   Special Requests NONE   Final   Culture     Final   Value: GROUP B STREP(S.AGALACTIAE)ISOLATED     Note: TESTING AGAINST S. AGALACTIAE NOT ROUTINELY PERFORMED DUE TO PREDICTABILITY OF AMP/PEN/VAN  SUSCEPTIBILITY.     Performed at [facility]   Report Status 07/31/2014 FINAL   Final  GROUP B STREP BY PCR     Status: Abnormal   Collection Time    07/28/14  6:50 PM      Result Value Ref Range Status   Group B strep by PCR POSITIVE (*) NEGATIVE Final   Comment: RESULT CALLED TO, READ BACK BY AND VERIFIED WITH:     LASHLEY,K. AT 2002 ON 07/28/2014 BY HOUEGNIFIO M  OB RESULTS CONSOLE GBS     Status: None   Collection Time    07/28/14  6:50 PM      Result Value Ref Range Status   GBS Positive   Final  MRSA PCR SCREENING     Status: None   Collection Time    07/30/14  5:35 AM      Result Value Ref Range Status   MRSA by PCR NEGATIVE  NEGATIVE Final   Comment:            The GeneXpert MRSA Assay (FDA     approved for NASAL specimens     only), is one component of a     comprehensive MRSA colonization     surveillance program. It is not     intended to diagnose MRSA     infection nor to guide or     monitor treatment for     MRSA infections.    Coagulation Studies: No results found for this basename: LABPROT, INR,  in the last 72 hours  Imaging: Mr Brain Wo Contrast  08/14/2014   CLINICAL DATA:  Assess for stroke. Two weeks postpartum for preeclampsia. Past history of complicated migraine, depression, irritable bowel syndrome, anxiety and gastric bypass.  EXAM: MRI HEAD WITHOUT CONTRAST  TECHNIQUE: Multiplanar, multiecho pulse sequences of the brain and surrounding structures were obtained without intravenous contrast.  COMPARISON:  CT of the head August 13, 2014 and MRI of the brain July 31, 2014  FINDINGS: The ventricles and sulci are normal. No abnormal parenchymal signal, mass lesions or mass of affect. No reduced diffusion to suggest acute ischemia. No susceptibility artifact to suggest hemorrhage.  No abnormal extra-axial fluid collection. No abnormal calvarial bone marrow signal. Ocular globes and orbital contents are unremarkable though not tailored for  evaluation. Visualized paranasal sinuses and mastoid air cells appear well-aerated. No abnormal sellar expansion. Craniocervical junction is maintained.  IMPRESSION: No acute ischemia.  Normal MRI of the head without contrast.   Electronically Signed   By: Elon Alas   On: 08/14/2014 22:24    Medications:  Scheduled: . clonazePAM  0.5 mg Oral BID  . escitalopram  20 mg Oral Daily  . methocarbamol (ROBAXIN) IV  500 mg Intravenous Q6H  . methylPREDNISolone (SOLU-MEDROL) injection  1,000 mg Intravenous Daily  . naproxen  500 mg Oral BID WC  . nortriptyline  10 mg Oral QHS  . pantoprazole  40 mg Oral Q0600  . polyethylene glycol  17 g Oral Daily  . valproate sodium  500 mg Intravenous 3 times per day   Continuous: . sodium chloride 10 mL/hr at 08/16/14 9604   VWU:[id number], HYDROmorphone (DILAUDID) injection, promethazine, senna-docusate  Assessment/Plan:  HA:  Currently on Solumedrol 1000 mg daily, Depakote 500 mg TID, Pamelor 10 mg daily, naprosyn 500 Q6 hours, and Robaxin 500 mg Q 6 hours. No relief with HA.  At this time she cannot have DHE due to her complicated Migraines in past.  Would D/C home [place]. No further recommendations on HA treatment.  Given history of narcotic dependence would avoid narcotics.   nonepileptic seizures.  It has been explained to them that all the recorded paroxysmal events were not associated with concomitant EEG changes and thus non epileptic seizures is the most likely explanation for her episodes. Again it was explained that anti-seizure medications don't work in this particular scenario and the help of psychiatry is essential to achieve seizure control.  In addition, she was told obtaining a second opinion at an epilepsy center in an academic center may be beneficial.  Neurology will S/O  Plan discussed with Dr. Bishop Limbo PA-C [facility] [phone]  08/16/2014, 11:22 AM

## 2014-08-16 NOTE — Progress Notes (Signed)
Pt has another episode of "seizures" as described early lasting 4 minutes. Dr. [place] Pink notified. Neurologist [place] paged and made aware. States will arrange colleague to follow up and speak to Pt and family as per their request.

## 2014-08-16 NOTE — Progress Notes (Signed)
Physical Therapy Treatment Patient Details Name: Penny [name] MRN: [id number] DOB: 14-Aug-1986 Today's Date: 08/16/2014    History of Present Illness Penny [name] is a 28 y.o. female admitted 08/13/14 with encephalopathy and HA. PMH of migraines, depression, anemia, ADD, anxiety and hx of gastric bypass (2007). Pt with  multiple spells concerning for seizure, however no epileptiform activity noted despite several episodes during long term monitoring. Of note, pt's baby is in the NICU and is causing stress to pt. 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     PT Comments    Pt seen by PT/OT for co-treatment to safely address functional mobility. Prior to ambulation pt and friend reports that pt does not have episodes unless she walks further than just to the bathroom. Pt was able to increase mobility with hand held (A) today and experienced "episode" upon returning to chair in room. While ambulating in hallway, pt was asked if she wanted to sit to take a rest break and pt responded "no, I don't want anyone to see me out here when it happens." Epsiode lasted ~5 minutes and was similar in presentation to last therapy session with rigid posturing of entire body. Pt A&Ox4 following event. Will cont to follow as tolerated by pt. Pt in tears at end of session and c/o pain in Lt hip; RN aware.    Follow Up Recommendations  No PT follow up;Supervision/Assistance - 24 hour     Equipment Recommendations  None recommended by PT    Recommendations for Other Services       Precautions / Restrictions Precautions Precautions: Fall Precaution Comments: seizure-like activity following ambulation Restrictions Weight Bearing Restrictions: No    Mobility  Bed Mobility Overal bed mobility: Modified Independent Bed Mobility: Supine to Sit           General bed mobility comments: HOB flat, use of hand rails.   Transfers Overall transfer level: Needs  assistance Equipment used: 1 person hand held assist Transfers: Sit to/from Stand Sit to Stand: Min guard         General transfer comment: Min guard for safety with transfers. Pt able to ambulate while holding IV pole and close min guard from therapist for safety.   Ambulation/Gait Ambulation/Gait assistance: Min guard Ambulation Distance (Feet): 60 Feet Assistive device: 1 person hand held assist Gait Pattern/deviations: Step-through pattern;Antalgic;Decreased stance time - left;Decreased step length - right;Wide base of support Gait velocity: slow Gait velocity interpretation: Below normal speed for age/gender General Gait Details: pt with more antalgic gt today due to c/o Lt hip pain; cues for safety and pt limited due to pain; pt denied need for sitting rest break; stated "i dont want to sit and rest because i dont want people to see me have an episode"   Stairs            Wheelchair Mobility    Modified Rankin (Stroke Patients Only)       Balance Overall balance assessment: Needs assistance;History of Falls Sitting-balance support: Feet supported;No upper extremity supported Sitting balance-Leahy Scale: Good     Standing balance support: During functional activity;Single extremity supported Standing balance-Leahy Scale: Poor Standing balance comment: UE supported by handheld (A) or IV pole                    Cognition Arousal/Alertness: Awake/alert Behavior During Therapy: Flat affect Overall Cognitive Status: Impaired/Different from baseline Area of Impairment: Problem solving;Attention   Current Attention Level:  Selective         Problem Solving: Slow processing;Requires verbal cues;Decreased initiation;Requires tactile cues General Comments: friend insists on answering ?  for pt; pt with delayed responses to questions and delayed problem solving     Exercises      General Comments General comments (skin integrity, edema, etc.): see  impression statement for "episode" description      Pertinent Vitals/Pain Pain Assessment: Faces Pain Score: 8  Faces Pain Scale: Hurts worst Pain Location: pt tearful with movement of LT LE at end of session; Lt hip pain Pain Descriptors / Indicators: Throbbing Pain Intervention(s): Limited activity within patient's tolerance;Monitored during session;Repositioned;Patient requesting pain meds-RN notified    Home Living                      Prior Function            PT Goals (current goals can now be found in the care plan section) Acute Rehab PT Goals Patient Stated Goal: to not have these episodes PT Goal Formulation: With patient Time For Goal Achievement: 08/22/14 Potential to Achieve Goals: Good Progress towards PT goals: Progressing toward goals    Frequency  Min 3X/week    PT Plan Current plan remains appropriate    Co-evaluation PT/OT/SLP Co-Evaluation/Treatment: Yes Reason for Co-Treatment: For patient/therapist safety PT goals addressed during session: Mobility/safety with mobility;Proper use of DME;Balance OT goals addressed during session: ADL's and self-care     End of Session Equipment Utilized During Treatment: Gait belt Activity Tolerance: Patient limited by fatigue;Patient limited by pain Patient left: in chair;with call bell/phone within reach;with nursing/sitter in room;with family/visitor present     Time: 4944-9675 PT Time Calculation (min): 28 min  Charges:  $Gait Training: 8-22 mins                    G CodesMelina Modena [place], [place]  [phone] 08/16/2014, 3:35 PM

## 2014-08-16 NOTE — Progress Notes (Signed)
Occupational Therapy Treatment Patient Details Name: Penny [name] MRN: [id number] DOB: 1986-11-09 Today's Date: 08/16/2014    History of present illness Bayyinah L. Dillion is a 28 y.o. female admitted 08/13/14 with encephalopathy and HA. PMH of migraines, depression, anemia, ADD, anxiety and hx of gastric bypass (2007). Pt with  multiple spells concerning for seizure, however no epileptiform activity noted despite several episodes during long term monitoring. Of note, pt's baby is in the NICU and is causing stress to pt. 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    OT comments  Pt seen today by PT/OT for safety to address functional mobility. Pt was able to ambulate into bathroom and hallway, with onset of "episode" occurring after return to chair. While ambulating in hallway, pt was asked if she wanted to sit to take a rest break and pt responded "no, I don't want anyone to see me out here when it happens." Epsiode lasted ~5 minutes and was similar in presentation to last therapy session with rigid posturing of entire body. Pt A&Ox4 following event. Would benefit from UE exercises and OT plans to provide HEP.    Follow Up Recommendations  No OT follow up;Supervision/Assistance - 24 hour    Equipment Recommendations  Tub/shower seat    Recommendations for Other Services      Precautions / Restrictions Precautions Precautions: Fall Precaution Comments: seizure-like activity following ambulation Restrictions Weight Bearing Restrictions: No       Mobility Bed Mobility Overal bed mobility: Modified Independent                Transfers Overall transfer level: Needs assistance Equipment used: 1 person hand held assist Transfers: Sit to/from Stand Sit to Stand: Min guard         General transfer comment: Min guard for safety with transfers. Pt able to ambulate while holding IV pole and close min guard from therapist for safety.          ADL Overall ADL's : Needs assistance/impaired                                       General ADL Comments: Pt overall min guard for ADLs due to need for increased safety with episodes occuring when pt is ambulating. Pt deconditioned and requires min guard to ambulate.                Cognition  Arousal/Alertness: Awake/Alert Behavior During Therapy: Flat affect Overall Cognitive Status: Impaired/Different from baseline Area of Impairment: Problem solving;Attention   Current Attention Level: Selective          Problem Solving: Slow processing;Requires verbal cues;Decreased initiation;Requires tactile cues General Comments: pt continues to present with delayed processing and slow responses. Pt defers to family/friends to answer questions.                 Pertinent Vitals/ Pain       Pain Assessment: Faces Pain Score: 8  Faces Pain Scale: Hurts even more Pain Location: Lt leg pain and HA Pain Descriptors / Indicators: Throbbing;Sharp;Shooting Pain Intervention(s): Limited activity within patient's tolerance;Monitored during session;Repositioned         Frequency Min 1X/week     Progress Toward Goals  OT Goals(current goals can now be found in the care plan section)  Progress towards OT goals: Not progressing toward goals - comment (pt continues to have safety limitations  due to onset of )  Acute Rehab OT Goals Patient Stated Goal: to not have these episodes OT Goal Formulation: With patient Time For Goal Achievement: 08/22/14 Potential to Achieve Goals: Good ADL Goals Pt Will Transfer to Toilet: Independently;ambulating Pt Will Perform Tub/Shower Transfer: Independently;ambulating Pt/caregiver will Perform Home Exercise Program: Increased strength;Both right and left upper extremity;With written HEP provided;Independently;With theraband  Plan Discharge plan remains appropriate    Co-evaluation    PT/OT/SLP Co-Evaluation/Treatment:  Yes Reason for Co-Treatment: For patient/therapist safety;Other (comment) (for safety due to pt's seizure-like episodes)   OT goals addressed during session: ADL's and self-care      End of Session Equipment Utilized During Treatment: Rolling walker;Other (comment) (IV pole)   Activity Tolerance Other (comment) (pt tolerated treatment well, but limited due to onset of epi)   Patient Left in chair;with call bell/phone within reach;with family/visitor present;with nursing/sitter in room   Nurse Communication Other (comment) (pt having another episode)        Time: 9924-2683 OT Time Calculation (min): 28 min  Charges: OT General Charges $OT Visit: 1 Procedure OT Treatments $Self Care/Home Management : 8-22 mins  Juluis Rainier [phone] 08/16/2014, 1:41 PM

## 2014-08-16 NOTE — Progress Notes (Signed)
Pt refused 1700 scheduled Naproxen and Robaxin. States "not helping pain" and that "would be wasted" Pt and family requesting to talk to MD regarding poor pain management.Dr Curly Rim paged and made aware of the above. Pt husband would like to discuss with Neurologist after speaking with Dr. [place] Pink. Reports "prefer the one who saw her yesterday" Paged Dr. Armida Sans, O.; awaiting return call.

## 2014-08-16 NOTE — Progress Notes (Signed)
Patient just went through another episode of posturing lasting about 3 min. Patient reported a left side headache with a level of 9 on the pain scale. VSS. Physician paged.

## 2014-08-16 NOTE — Progress Notes (Signed)
Pt complaining of new onset pain in her back and down her left leg. MD paged.

## 2014-08-17 LAB — BASIC METABOLIC PANEL
Anion gap: 15 (ref 5–15)
BUN: 13 mg/dL (ref 6–23)
CO2: 19 meq/L (ref 19–32)
Calcium: 8.8 mg/dL (ref 8.4–10.5)
Chloride: 106 mEq/L (ref 96–112)
Creatinine, Ser: 0.39 mg/dL — ABNORMAL LOW (ref 0.50–1.10)
GFR calc Af Amer: >90 mL/min (ref >90)
GFR calc non Af Amer: >90 mL/min (ref >90)
GLUCOSE: 107 mg/dL — AB (ref 70–99)
POTASSIUM: 4.3 meq/L (ref 3.7–5.3)
Sodium: 140 mEq/L (ref 137–147)

## 2014-08-17 LAB — CBC
HEMATOCRIT: 31.2 % — AB (ref 36.0–46.0)
HEMOGLOBIN: 10.2 g/dL — AB (ref 12.0–15.0)
MCH: 28.9 pg (ref 26.0–34.0)
MCHC: 32.7 g/dL (ref 30.0–36.0)
MCV: 88.4 fL (ref 78.0–100.0)
Platelets: 329 10*3/uL (ref 150–400)
RBC: 3.53 MIL/uL — AB (ref 3.87–5.11)
RDW: 13.4 % (ref 11.5–15.5)
WBC: 15.1 10*3/uL — ABNORMAL HIGH (ref 4.0–10.5)

## 2014-08-17 MED ORDER — OXYCODONE-ACETAMINOPHEN 5-325 MG PO TABS
1.0000 | ORAL_TABLET | ORAL | Status: DC | PRN
  Administered 2014-08-17: 1 via ORAL
  Filled 2014-08-17 (×2): qty 1

## 2014-08-17 NOTE — Progress Notes (Addendum)
Pt family called reported patient having a seizure. Upon arrival patient was on her left side upper extremities contracted and left leg flexed.Family reports that she had just gotten up to  use the bathroom and she "spaced out" before the seizure which lasted about 2 minutes.Patient oriented to person and place but with slow responses post the episode.

## 2014-08-17 NOTE — Progress Notes (Signed)
Patient transferred to [facility] via [place].Alert oriented x4 c/o pain 10/10 and medicated prior to transfer with percocet.

## 2014-08-17 NOTE — Discharge Summary (Signed)
[facility]  Physician Discharge Summary   Patient ID: Penny [name] MRN: [id number] DOB/AGE: [birth year] 28 y.o.  Admit date: 08/13/2014 Discharge date: 08/17/2014  PCP: [name], MD  DISCHARGE DIAGNOSES:  Active Problems:   Iron deficiency anemia   Encephalopathy   Headache(784.0)   Altered mental status   RECOMMENDATIONS FOR OUTPATIENT FOLLOW UP: 1. Patient being transferred to [facility]  Transfer CONDITION: fair  Diet: Regular  Filed Weights   08/13/14 0055 08/13/14 0535  Weight: 99.791 kg (220 lb) 97.932 kg (215 lb 14.4 oz)    INITIAL HISTORY: 28yo with PMH as below who is post partum with history of preeclampsia and who presented with headaches with questionable posturing at home. No GTC activity. Patient seen by neurology. She underwent continuous EEG and MRI Brain. No epileptiform activity was noted. She continues to have these posturing spells. 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 Psychiatry consult called but no specific input was provided.  Consultants: Neurology, Psychiatry   Procedures: Continuous EEG: No epileptiform acitivity   Antibiotics:  None  HOSPITAL COURSE:   Acute Encephalopathy/Unclear Spells  No clear etiology has been found so far. Work up negative so far. EEG monitoring completed and no epileptiform activity was noted. MRI Brain was negative as well. Neurology has been following. No proteinuria, anemia improved, LFTs within normal limits, platelets within normal limits. There could be a psychiatric component. No further testing per neurology. Patient was seen by psychiatrist. They recommended considering changing to cymbalta from Lexapro as it could provide analgesic effect. But no input was provided regarding her episodes. 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 This is a very difficult situation. We waited to see if her symptoms improved with headache control. But these spells continue.  Have discussed with Neurology at [facility] (Dr. Hall Busing). She suggested MRI with contrast/MRV to rule out venous thrombosis considering her headaches. But it would be unlikely for that to cause these spells. Since we are unable to offer patient much more at this time and second opinion was requested it was felt that further testing can be pursued at [facility]. Dr. Hall Busing accepted patient in transfer. Patient and family agreeable.  Migraine headaches  Started on Depakote and high dose steroids by neurology. She was placed on narcotics as well. On Nortriptyline as well. Hasn't had any relief to her symptoms.   Normocytic Anemia  Stable.   Metabolic Acidosis  Resolved. Etiology is unclear. Lactic acid was normal.   Postpartum  Stable. Some vaginal spotting. No heavy bleeding. Her child is still in NICU. Was preterm. Will not be breastfeeding the child.  Patient to be transferred to [facility] for tertiary/academic level of care.   PERTINENT LABS:  The results of significant diagnostics from this hospitalization (including imaging, microbiology, ancillary and laboratory) are listed below for reference.     Labs: Basic Metabolic Panel:  Recent Labs Lab 08/13/14 0109 08/13/14 0254 08/13/14 0307 08/14/14 0340 08/15/14 0546 08/17/14 0606  NA 142  --  140 141 140 140  K 3.2*  --  3.9 3.8 3.9 4.3  CL 108  --  107 107 104 106  CO2 17*  --  20 25 25 19   GLUCOSE 94  --  120* 92 89 107*  BUN 11  --  13 17 11 13   CREATININE 0.44*  --  0.43* 0.50 0.45* 0.39*  CALCIUM 8.9  --  8.6 8.3* 8.4 8.8  MG  --  2.2  --   --   --   --  Liver Function Tests:  Recent Labs Lab 08/13/14 0109 08/13/14 0307 08/14/14 0340  AST 21 20 27   ALT 19 18 20   ALKPHOS 118* 112 96  BILITOT 0.3 0.3 0.3  PROT 6.9 6.8 5.8*  ALBUMIN 3.4* 3.3* 2.8*    Recent Labs Lab 08/13/14 0307  AMMONIA 22   CBC:  Recent Labs Lab 08/13/14 0109 08/13/14 0307 08/14/14 0340 08/15/14 0546  08/17/14 0606  WBC 7.0 7.8 5.8 4.9 15.1*  NEUTROABS 3.4 5.2 2.3  --   --   HGB 11.6* 11.0* 10.1* 9.9* 10.2*  HCT 33.9* 32.0* 30.2* 29.7* 31.2*  MCV 88.7 88.6 90.1 87.9 88.4  PLT 386 332 279 261 329    IMAGING STUDIES Ct Head Wo Contrast  08/13/2014   CLINICAL DATA:  Loss of consciousness  EXAM: CT HEAD WITHOUT CONTRAST  TECHNIQUE: Contiguous axial images were obtained from the base of the skull through the vertex without intravenous contrast.  COMPARISON:  Prior MRI from 07/31/2014  FINDINGS: There is no acute intracranial hemorrhage or infarct. No mass lesion or midline shift. Gray-white matter differentiation is well maintained. Ventricles are normal in size without evidence of hydrocephalus. CSF containing spaces are within normal limits. No extra-axial fluid collection.  The calvarium is intact.  Orbital soft tissues are within normal limits.  Minimal opacity noted within the partially visualized right maxillary sinus. Otherwise, the paranasal sinuses and mastoid air cells are well pneumatized and free of fluid.  Scalp soft tissues are unremarkable.  IMPRESSION: Negative head CT.  No acute intracranial abnormality identified.   Electronically Signed   By: Jeannine Boga M.D.   On: 08/13/2014 01:51   Ct Head Wo Contrast  07/27/2014   CLINICAL DATA:  Concern for PRES.  Pre-eclampsia.  EXAM: CT HEAD WITHOUT CONTRAST  TECHNIQUE: Contiguous axial images were obtained from the base of the skull through the vertex without intravenous contrast.  COMPARISON:  None.  FINDINGS: Skull and Sinuses:Negative for fracture or destructive process. The mastoids, middle ears, and imaged paranasal sinuses are clear.  Orbits: No acute abnormality.  Brain: No evidence of acute abnormality, such as acute infarction, hemorrhage, hydrocephalus, or mass lesion/mass effect.  IMPRESSION: Negative head CT.   Electronically Signed   By: Jorje Guild M.D.   On: 07/27/2014 22:10   Mr Brain Wo Contrast  08/14/2014    CLINICAL DATA:  Assess for stroke. Two weeks postpartum for preeclampsia. Past history of complicated migraine, depression, irritable bowel syndrome, anxiety and gastric bypass.  EXAM: MRI HEAD WITHOUT CONTRAST  TECHNIQUE: Multiplanar, multiecho pulse sequences of the brain and surrounding structures were obtained without intravenous contrast.  COMPARISON:  CT of the head August 13, 2014 and MRI of the brain July 31, 2014  FINDINGS: The ventricles and sulci are normal. No abnormal parenchymal signal, mass lesions or mass of affect. No reduced diffusion to suggest acute ischemia. No susceptibility artifact to suggest hemorrhage.  No abnormal extra-axial fluid collection. No abnormal calvarial bone marrow signal. Ocular globes and orbital contents are unremarkable though not tailored for evaluation. Visualized paranasal sinuses and mastoid air cells appear well-aerated. No abnormal sellar expansion. Craniocervical junction is maintained.  IMPRESSION: No acute ischemia.  Normal MRI of the head without contrast.   Electronically Signed   By: Elon Alas   On: 08/14/2014 22:24   Mr Brain Wo Contrast  07/31/2014   CLINICAL DATA:  Altered mental status.  Possible PRES.  EXAM: MRI HEAD WITHOUT CONTRAST  TECHNIQUE: Multiplanar, multiecho pulse sequences  of the brain and surrounding structures were obtained without intravenous contrast.  COMPARISON:  CT head 07/27/2014.  FINDINGS: The patient was unable to remain motionless for the exam. Small or subtle lesions could be overlooked.  No evidence for acute infarction, hemorrhage, mass lesion, hydrocephalus, or extra-axial fluid. Normal cerebral volume. No white matter disease. No foci of chronic hemorrhage. Flow voids are maintained. No midline abnormality. Extracranial soft tissues unremarkable.  IMPRESSION: Negative exam within limits of evaluation for motion degradation. No acute intracranial findings are evident.   Electronically Signed   By: Rolla Flatten M.D.    On: 07/31/2014 12:16   Mr Thoracic Spine Wo Contrast  07/31/2014   CLINICAL DATA:  Right leg numbness and inability to raise the right leg since epidural injection for childbirth 2 days ago. Altered mental status.  EXAM: MRI THORACIC SPINE WITHOUT CONTRAST  TECHNIQUE: Multiplanar, multisequence MR imaging of the thoracic spine was performed. No intravenous contrast was administered.  COMPARISON:  Chest x-ray dated 08/21/2013  FINDINGS: The thoracic spinal cord is normal with no mass lesion or myelopathy or spinal cord compression. Conus tip is at L1. The discs throughout the thoracic spine are normal. No spinal or foraminal stenosis. Osseous structures are normal except for tiny hemangiomas in the T6 and T7.  There is no epidural fluid collection.  Note is made of a small area of patchy infiltrate in the right upper lobe on image number 8 of series 9.  IMPRESSION: 1. Normal MRI of the thoracic spine. 2. Small patchy area of infiltrate in the right upper lobe.   Electronically Signed   By: Rozetta Nunnery M.D.   On: 07/31/2014 12:37   Mr Lumbar Spine Wo Contrast  07/30/2014   CLINICAL DATA:  28 year old female status post vaginal delivery with epidural anesthesia, now with lumbosacral plexopathy. Initial encounter. Possible epidural hematoma.  EXAM: MRI LUMBAR SPINE WITHOUT CONTRAST  TECHNIQUE: Multiplanar, multisequence MR imaging of the lumbar spine was performed. No intravenous contrast was administered.  COMPARISON:  CT Abdomen and Pelvis 11/13/2012.  FINDINGS: Large body habitus. Normal lumbar segmentation demonstrated on the comparison. Stable, normal vertebral height and alignment. No marrow edema or evidence of acute osseous abnormality.  Visualized lower thoracic spinal cord is normal with conus medularis at L1.  Edema in the posterior subcutaneous fat, a common finding in immobilized patient. Trace pelvic free fluid, likely physiologic. Postpartum uterus partially visible. Other visualized abdominal  viscera are within normal limits.  T11-T12:  Negative.  T12-L1:  Negative.  L1-L2:  Negative.  L2-L3:  Negative disc.  Mild facet hypertrophy, no stenosis.  L3-L4: Mild disc desiccation. Mild disc bulge. No definite disc herniation. Mild facet hypertrophy. Mild flattening of the ventral thecal sac without spinal stenosis or convincing neural impingement.  L4-L5: Disc desiccation. Minor disc bulging. Superimposed small right paracentral disc protrusion with annular fissure, best seen on series 7, image 24). Disc in proximity to the descending right L5 nerve roots, but no spinal or lateral recess stenosis. Mild facet hypertrophy. No foraminal stenosis.  L5-S1: Negative disc. Mild facet hypertrophy greater on the right. 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  No epidural process or mass in the visualized lower thoracic or lumbosacral spine.  IMPRESSION: 1. Negative for lumbar epidural hematoma. No lumbar spinal stenosis. 2. Mild L3-L4 and L4-L5 disc degeneration. Small disc herniation at the latter directed toward the right. No definite nerve root mass effect, but still this could be a source for  right L5 radiculitis. 3. Intermittent mild to moderate lumbar facet hypertrophy. 4. Postpartum uterus and trace pelvic free fluid.   Electronically Signed   By: Lars Pinks M.D.   On: 07/30/2014 15:15   US Fetal Bpp W/o Non Stress  07/26/2014   OBSTETRICAL ULTRASOUND: This exam was performed within a [place] [facility]. The OB US report was generated in the [facility] system, and faxed to the ordering physician.   This report is available in the [facility]. See the [facility] Obstetric US report via the Image Link.  US Fetal Bpp W/o Non Stress  07/23/2014   FOLLOW UP SONOGRAM   Dilynn L Tamas is in the office for a follow up sonogram for BPP/AFI/UAD.  She is a 28 y.o. year old G57P1011 with Estimated Date of Delivery: 09/02/14  now at  [redacted]w[redacted]d weeks gestation. Thus far the pregnancy has been  complicated  by [facility].  GESTATION: SINGLETON  PRESENTATION: cephalic  FETAL ACTIVITY:          Heart rate         163 bpm          The fetus is active.  AMNIOTIC FLUID: The amniotic fluid volume is  normal, 13.7 cm SDP-5.2cm.  PLACENTA LOCALIZATION:  anterior with succ lobe posterior GRADE 2  CERVIX: N/A  ADNEXA: Adnexa appears WNL  GESTATIONAL AGE AND  BIOMETRICS:  Gestational criteria: Estimated Date of Delivery: 09/02/14  now at [redacted]w[redacted]d   BIOPHYSCIAL PROFILE:                                                                                                       [place]                 2   TONE                2   RESPIRATIONS                2   AMNIOTIC FLUID                2                                                            SCORE:  8/8 (Note: NST was not performed as part of this antepartum testing)   DOPPLER FLOW STUDIES: UMBILICAL ARTERY RI RATIOS:   0.58, 0.54   SUSPECTED ABNORMALITIES:  no  QUALITY OF SCAN: satisfactory  TECHNICIAN COMMENTS:  U/S(34+1wks)-vtx active fetus BPP 8/8, fluid WNL AFI-13.7cm, FHR-163 bpm,  UA Doppler RI-0.54 & 0.58, anterior placenta with succ lobe posterior   A copy of this report including all images has been saved and backed up to  a second source for retrieval if needed. All measures and details of the  anatomical scan, placentation, fluid volume and pelvic anatomy are  contained in that report.  Lazarus Gowda 07/23/2014 1:39 PM  Clinical Impression and recommendations:  I have reviewed the sonogram results above, combined with the patient's  current clinical course, below are my impressions and any appropriate  recommendations for management based on the sonographic findings.   1.  [id number] Estimated Date of Delivery: 09/02/14 by  early ultrasound and  confirmed by today's sonographic dating 2.  Normal fetal sonographic findings, specifically normal fluid,  reassuring fetal testing/surveillance and excellent Doppler flow 3.  Normal general sonographic  findings  Recommend continued twice weekly fetal surveillance  EURE,LUTHER H 07/23/2014 2:10 PM        [name] Ua Cord Doppler  07/23/2014   FOLLOW UP SONOGRAM   Bristyn L Gravatt is in the office for a follow up sonogram for BPP/AFI/UAD.  She is a 28 y.o. year old G4P1011 with Estimated Date of Delivery: 09/02/14  now at  [redacted]w[redacted]d weeks gestation. Thus far the pregnancy has been complicated  by [facility].  GESTATION: SINGLETON  PRESENTATION: cephalic  FETAL ACTIVITY:          Heart rate         163 bpm          The fetus is active.  AMNIOTIC FLUID: The amniotic fluid volume is  normal, 13.7 cm SDP-5.2cm.  PLACENTA LOCALIZATION:  anterior with succ lobe posterior GRADE 2  CERVIX: N/A  ADNEXA: Adnexa appears WNL  GESTATIONAL AGE AND  BIOMETRICS:  Gestational criteria: Estimated Date of Delivery: 09/02/14  now at [redacted]w[redacted]d   BIOPHYSCIAL PROFILE:                                                                                                       [place]                 2   TONE                2   RESPIRATIONS                2   AMNIOTIC FLUID                2                                                            SCORE:  8/8 (Note: NST was not performed as part of this antepartum testing)   DOPPLER FLOW STUDIES: UMBILICAL ARTERY RI RATIOS:   0.58, 0.54   SUSPECTED ABNORMALITIES:  no  QUALITY OF SCAN: satisfactory  TECHNICIAN COMMENTS:  U/S(34+1wks)-vtx active fetus BPP 8/8, fluid WNL AFI-13.7cm, FHR-163 bpm,  UA Doppler RI-0.54 & 0.58, anterior placenta with succ lobe posterior   A copy of this report including all images has been saved and backed up to  a second source for retrieval if needed. All measures and  details of the  anatomical scan, placentation, fluid volume and pelvic anatomy are  contained in that report.  Lazarus Gowda 07/23/2014 1:39 PM  Clinical Impression and recommendations:  I have reviewed the sonogram results above, combined with the patient's  current clinical course, below are my  impressions and any appropriate  recommendations for management based on the sonographic findings.   1.  [id number] Estimated Date of Delivery: 09/02/14 by  early ultrasound and  confirmed by today's sonographic dating 2.  Normal fetal sonographic findings, specifically normal fluid,  reassuring fetal testing/surveillance and excellent Doppler flow 3.  Normal general sonographic findings  Recommend continued twice weekly fetal surveillance  EURE,LUTHER H 07/23/2014 2:10 PM        US Fetal Bpp W/non Stress  08/04/2014   FOLLOW UP SONOGRAM   Mairead L Scritchfield is in the office for a follow up sonogram for Nr NST.  She is a 28 y.o. year old G31P1011 with Estimated Date of Delivery: 09/02/14   now at  [redacted]w[redacted]d weeks gestation. Thus far the pregnancy has been  complicated by poor hx.  GESTATION: SINGLETON  PRESENTATION: cephalic  FETAL ACTIVITY:          Heart rate         148 bpm          The fetus is active.  AMNIOTIC FLUID: The amniotic fluid volume is  normal, 12.4 cm SDP-4.5cm.  PLACENTA LOCALIZATION:  anterior, posterior GRADE 2  CERVIX: Unable to view  ADNEXA: The adnexa appears normal.   GESTATIONAL AGE AND  BIOMETRICS:  Gestational criteria: Estimated Date of Delivery: 09/02/14 now at [redacted]w[redacted]d   BIOPHYSCIAL PROFILE:                                                                                                       [place]                 2   TONE                2   RESPIRATIONS                2   AMNIOTIC FLUID                2   NST                                                       0                                                          SCORE:  8/10 (Note: NST was performed as part of this antepartum testing)     SUSPECTED  ABNORMALITIES:  no  QUALITY OF SCAN: satisfactory  TECHNICIAN COMMENTS:  U/S-vtx active fetus BPP 8/10, fluid WNL AFI-12.4cm SDP-4.5cm, FHR-148 bpm   A copy of this report including all images has been saved and backed up to  a second source for retrieval if needed. All  measures and details of the  anatomical scan, placentation, fluid volume and pelvic anatomy are  contained in that report.  Lazarus Gowda 07/26/2014 12:30 PM  Clinical Impression and recommendations:  I have reviewed the sonogram results above, combined with the patient's  current clinical course, below are my impressions and any appropriate  recommendations for management based on the sonographic findings.   1.  [id number] Estimated Date of Delivery: 09/02/14 by  early ultrasound  2.  Normal fetal sonographic findings, specifically reassuring fetal  surveillance after a NR NST 3.  Normal general sonographic findings  Recommend routine prenatal care based on this sonogram or as clinically  indicated  EURE,LUTHER H 08/04/2014 5:17 PM         DISCHARGE EXAMINATION: Filed Vitals:   08/16/14 2200 08/16/14 2250 08/17/14 0200 08/17/14 0617  BP: 126/75 135/81 119/59 121/64  Pulse: 73 80 95 75  Temp: 98 F (36.7 C) 98.1 F (36.7 C) 98.4 F (36.9 C) 98 F (36.7 C)  TempSrc: Oral Oral Oral Oral  Resp: 18 18 18 18   Height:      Weight:      SpO2: 98% 97% 95% 96%   General appearance: alert, cooperative, appears stated age and no distress Resp: clear to auscultation bilaterally Cardio: regular rate and rhythm, S1, S2 normal, no murmur, click, rub or gallop  DISPOSITION: Transfer to [place]:  Allergies  Allergen Reactions  . Gabapentin Other (See Comments)    Pt states that she was unresponsive after taking this medication, but her vitals remained stable.    . Metoclopramide Hcl Anxiety and Other (See Comments)    Pt states that she felt like she was trapped in a box, and could not get out.  Pt also states that she had temporary loss of movement, weakness, and tingling.    . Topamax [Topiramate] Shortness Of Breath and Other (See Comments)    Adverse side effects.  Pt states this medication changed her taste for a short period of time and caused numbness and tingling in  extremities.    . Codeine Nausea And Vomiting  . Latex Rash  . Tape Itching and Rash    Prior to Admission (Home) Medications:  Prescriptions prior to admission  Medication Sig Dispense Refill  . acetaminophen (TYLENOL) 500 MG tablet Take 1,000 mg by mouth every 6 (six) hours as needed for headache (pain).       Marland Kitchen amLODipine (NORVASC) 10 MG tablet Take 1 tablet (10 mg total) by mouth daily.  30 tablet  1  . BIOTIN PO Take 1 tablet by mouth at bedtime.       . butalbital-acetaminophen-caffeine (FIORICET, ESGIC) 50-325-40 MG per tablet Take 1 tablet by mouth every 6 (six) hours as needed for headache.      . Cholecalciferol (VITAMIN D3) 2000 UNITS TABS Take 2,000 Units by mouth at bedtime.       . clonazePAM (KLONOPIN) 0.5 MG tablet Take 0.5 mg by mouth 2 (two) times daily.      Marland Kitchen escitalopram (LEXAPRO) 20 MG tablet Take 1 tablet (20 mg total) by mouth daily.  30 tablet  11  . ketorolac (TORADOL) 10 MG tablet  Take 1 tablet (10 mg total) by mouth every 6 (six) hours as needed.  20 tablet  0  . lidocaine (XYLOCAINE JELLY) 2 % jelly Apply 1 application topically as needed.  30 mL  2  . Multiple Vitamins-Minerals (ZINC PO) Take 1 tablet by mouth at bedtime.       . nortriptyline (PAMELOR) 10 MG capsule Take 10 mg by mouth at bedtime.      Marland Kitchen nystatin (MYCOSTATIN) 100000 UNIT/ML suspension Take 5 mLs by mouth 4 (four) times daily as needed (mouth pain).      . Prenatal Vit-Fe Fumarate-FA (PRENATAL MULTIVITAMIN) TABS Take 1 tablet by mouth at bedtime.       . promethazine (PHENERGAN) 25 MG tablet TAKE ONE TABLET BY MOUTH EVERY 6 HOURS AS NEEDED FOR NAUSEA.  22 tablet  1  . rizatriptan (MAXALT) 10 MG tablet Take 1 tablet (10 mg total) by mouth as needed for migraine. May repeat in 2 hours if needed  10 tablet  0  . zolpidem (AMBIEN) 10 MG tablet Take 1 tablet (10 mg total) by mouth at bedtime as needed for sleep.  15 tablet  3  . Ascorbic Acid (VITAMIN C) 100 MG CHEW Chew 200 mg by mouth at bedtime.         Scheduled Inpatient Medications: . clonazePAM  0.5 mg Oral BID  . escitalopram  20 mg Oral Daily  . methocarbamol (ROBAXIN) IV  500 mg Intravenous Q6H  . methylPREDNISolone (SOLU-MEDROL) injection  1,000 mg Intravenous Daily  . naproxen  500 mg Oral BID WC  . nortriptyline  10 mg Oral QHS  . pantoprazole  40 mg Oral Q0600  . polyethylene glycol  17 g Oral Daily  . valproate sodium  500 mg Intravenous 3 times per day   Continuous Inpatient: . sodium chloride 10 mL/hr at 08/16/14 0811   PRN Inpatient Medications: morphine injection, oxyCODONE-acetaminophen, promethazine, senna-docusate   [facility]  [facility] Pager [phone]  08/17/2014, 9:29 AM  Disclaimer: This note was dictated with voice recognition software. Similar sounding words can inadvertently be transcribed and may not be corrected upon review.

## 2014-08-17 NOTE — Progress Notes (Signed)
Pt had another seizure. Lasted 6 minutes. No neuro changes.

## 2014-08-17 NOTE — Progress Notes (Signed)
Called in to patient room with c/o seizure lasted less than a minute.Patient with slow response to questions but orient to person and place.

## 2014-08-17 NOTE — Progress Notes (Signed)
Nurse called to room by spouse pt having another seizure, while taking on the phone to mom and spouse noted she was talking slower and slower. This lasted several minutes.

## 2014-08-17 NOTE — Progress Notes (Signed)
Pt had another seizure. Started at 2144 and lasted for 2 minutes. Witnessed entire episode. Patient contracts hands, put jaw to left side and makes gulping sounds. Patient was alert and oriented immediately after but had delayed and slow responses to questions. Neurology paged. No new orders at this time. Will continue to monitor.

## 2014-08-17 NOTE — Progress Notes (Signed)
4th seizure episode today, lasting several minutes, patient c/o back pain when she was coming out of the seizure. Family stated patient was lying in the bed and was trying to reposition when the seizure occurred. Pt stated her legs felt numb and pain is 10/10. Pt to receive ordered IV Robaxion. Spouse at bedside.

## 2014-08-18 DIAGNOSIS — R569 Unspecified convulsions: Secondary | ICD-10-CM | POA: Insufficient documentation

## 2014-08-19 ENCOUNTER — Ambulatory Visit: Payer: Medicaid Other | Admitting: Obstetrics & Gynecology

## 2014-08-19 ENCOUNTER — Telehealth: Payer: Self-pay | Admitting: Obstetrics & Gynecology

## 2014-08-19 DIAGNOSIS — G43909 Migraine, unspecified, not intractable, without status migrainosus: Secondary | ICD-10-CM | POA: Insufficient documentation

## 2014-08-22 ENCOUNTER — Encounter: Payer: Self-pay | Admitting: Obstetrics & Gynecology

## 2014-08-22 ENCOUNTER — Ambulatory Visit (INDEPENDENT_AMBULATORY_CARE_PROVIDER_SITE_OTHER): Payer: Medicaid Other | Admitting: Obstetrics & Gynecology

## 2014-08-22 VITALS — BP 130/100 | Wt 218.0 lb

## 2014-08-22 DIAGNOSIS — R1031 Right lower quadrant pain: Secondary | ICD-10-CM | POA: Insufficient documentation

## 2014-08-22 MED ORDER — NORTRIPTYLINE HCL 10 MG PO CAPS: 10.0000 mg | ORAL_CAPSULE | Freq: Every day | ORAL | Status: DC

## 2014-08-22 MED ORDER — ESCITALOPRAM OXALATE 20 MG PO TABS: 20.0000 mg | ORAL_TABLET | Freq: Every day | ORAL | Status: DC

## 2014-08-23 LAB — AMYLASE: AMYLASE: 28 U/L (ref 0–105)

## 2014-08-23 LAB — LIPASE: LIPASE: 21 U/L (ref 0–75)

## 2014-08-26 ENCOUNTER — Telehealth: Payer: Self-pay | Admitting: Obstetrics & Gynecology

## 2014-08-26 ENCOUNTER — Other Ambulatory Visit: Payer: Self-pay | Admitting: Obstetrics & Gynecology

## 2014-08-26 DIAGNOSIS — R1031 Right lower quadrant pain: Secondary | ICD-10-CM

## 2014-08-26 NOTE — Telephone Encounter (Signed)
Her labs were normal.  Working on getting a CT scan pre certified as next step

## 2014-08-26 NOTE — Telephone Encounter (Signed)
Pt states having severe pain on right side of lower abdomen, rated pain 8-9 on 1-10 scale. Pt informed of lab results from 08/22/2014.

## 2014-08-27 ENCOUNTER — Telehealth: Payer: Self-pay | Admitting: Obstetrics & Gynecology

## 2014-08-27 NOTE — Telephone Encounter (Signed)
Pt husband Penny [name] states Penny [name] was at [facility] last night for seizures, requesting medication Nortriptyline and Lexapro to be adjusted. Per Dr. Elonda Husky Pt will need to have follow up with [facility] for these medication, Dr. Elonda Husky not managing these medication for patient. Penny [name], pt husband, verbalized understanding.

## 2014-08-28 ENCOUNTER — Encounter (HOSPITAL_COMMUNITY): Payer: Self-pay | Admitting: Emergency Medicine

## 2014-08-28 ENCOUNTER — Emergency Department (HOSPITAL_COMMUNITY)
Admission: EM | Admit: 2014-08-28 | Discharge: 2014-08-28 | Disposition: A | Discharge status: Home / Self Care | Payer: Medicaid Other | Attending: Emergency Medicine | Admitting: Emergency Medicine

## 2014-08-28 ENCOUNTER — Telehealth: Payer: Self-pay | Admitting: *Deleted

## 2014-08-28 ENCOUNTER — Ambulatory Visit (INDEPENDENT_AMBULATORY_CARE_PROVIDER_SITE_OTHER): Payer: Medicaid Other | Admitting: Obstetrics and Gynecology

## 2014-08-28 DIAGNOSIS — Z79899 Other long term (current) drug therapy: Secondary | ICD-10-CM | POA: Diagnosis not present

## 2014-08-28 DIAGNOSIS — F3289 Other specified depressive episodes: Secondary | ICD-10-CM | POA: Insufficient documentation

## 2014-08-28 DIAGNOSIS — F445 Conversion disorder with seizures or convulsions: Secondary | ICD-10-CM

## 2014-08-28 DIAGNOSIS — Z8742 Personal history of other diseases of the female genital tract: Secondary | ICD-10-CM | POA: Insufficient documentation

## 2014-08-28 DIAGNOSIS — Z87891 Personal history of nicotine dependence: Secondary | ICD-10-CM | POA: Insufficient documentation

## 2014-08-28 DIAGNOSIS — F411 Generalized anxiety disorder: Secondary | ICD-10-CM | POA: Diagnosis not present

## 2014-08-28 DIAGNOSIS — Z8719 Personal history of other diseases of the digestive system: Secondary | ICD-10-CM | POA: Insufficient documentation

## 2014-08-28 DIAGNOSIS — E669 Obesity, unspecified: Secondary | ICD-10-CM | POA: Insufficient documentation

## 2014-08-28 DIAGNOSIS — R51 Headache: Secondary | ICD-10-CM

## 2014-08-28 DIAGNOSIS — R011 Cardiac murmur, unspecified: Secondary | ICD-10-CM | POA: Diagnosis not present

## 2014-08-28 DIAGNOSIS — R569 Unspecified convulsions: Secondary | ICD-10-CM | POA: Insufficient documentation

## 2014-08-28 DIAGNOSIS — O9081 Anemia of the puerperium: Secondary | ICD-10-CM

## 2014-08-28 DIAGNOSIS — F329 Major depressive disorder, single episode, unspecified: Secondary | ICD-10-CM | POA: Diagnosis not present

## 2014-08-28 DIAGNOSIS — G43909 Migraine, unspecified, not intractable, without status migrainosus: Secondary | ICD-10-CM | POA: Insufficient documentation

## 2014-08-28 DIAGNOSIS — O09293 Supervision of pregnancy with other poor reproductive or obstetric history, third trimester: Secondary | ICD-10-CM

## 2014-08-28 DIAGNOSIS — R4182 Altered mental status, unspecified: Secondary | ICD-10-CM | POA: Diagnosis not present

## 2014-08-28 DIAGNOSIS — R519 Headache, unspecified: Secondary | ICD-10-CM

## 2014-08-28 DIAGNOSIS — Z91040 Latex allergy status: Secondary | ICD-10-CM | POA: Diagnosis not present

## 2014-08-28 LAB — CBC WITH DIFFERENTIAL/PLATELET
BASOS ABS: 0 10*3/uL (ref 0.0–0.1)
BASOS PCT: 0 % (ref 0–1)
Eosinophils Absolute: 0 10*3/uL (ref 0.0–0.7)
Eosinophils Relative: 1 % (ref 0–5)
HCT: 30.5 % — ABNORMAL LOW (ref 36.0–46.0)
HEMOGLOBIN: 10 g/dL — AB (ref 12.0–15.0)
Lymphocytes Relative: 31 % (ref 12–46)
Lymphs Abs: 2.7 10*3/uL (ref 0.7–4.0)
MCH: 29.6 pg (ref 26.0–34.0)
MCHC: 32.8 g/dL (ref 30.0–36.0)
MCV: 90.2 fL (ref 78.0–100.0)
MONOS PCT: 10 % (ref 3–12)
Monocytes Absolute: 0.9 10*3/uL (ref 0.1–1.0)
NEUTROS PCT: 58 % (ref 43–77)
Neutro Abs: 5.1 10*3/uL (ref 1.7–7.7)
Platelets: 213 10*3/uL (ref 150–400)
RBC: 3.38 MIL/uL — ABNORMAL LOW (ref 3.87–5.11)
RDW: 13.9 % (ref 11.5–15.5)
WBC: 8.8 10*3/uL (ref 4.0–10.5)

## 2014-08-28 LAB — COMPREHENSIVE METABOLIC PANEL
ALBUMIN: 3.3 g/dL — AB (ref 3.5–5.2)
ALK PHOS: 76 U/L (ref 39–117)
ALT: 19 U/L (ref 0–35)
ANION GAP: 12 (ref 5–15)
AST: 19 U/L (ref 0–37)
BUN: 11 mg/dL (ref 6–23)
CO2: 25 mEq/L (ref 19–32)
CREATININE: 0.54 mg/dL (ref 0.50–1.10)
Calcium: 8.7 mg/dL (ref 8.4–10.5)
Chloride: 103 mEq/L (ref 96–112)
GFR calc Af Amer: >90 mL/min (ref >90)
GFR calc non Af Amer: >90 mL/min (ref >90)
Glucose, Bld: 81 mg/dL (ref 70–99)
POTASSIUM: 4.2 meq/L (ref 3.7–5.3)
Sodium: 140 mEq/L (ref 137–147)
TOTAL PROTEIN: 6.5 g/dL (ref 6.0–8.3)
Total Bilirubin: 0.2 mg/dL — ABNORMAL LOW (ref 0.3–1.2)

## 2014-08-28 IMAGING — US US RENAL
1 series · 14 of 25 positions shown · non-contrast
Comparison: None.

CLINICAL DATA: Evaluate for hydronephrosis

EXAM:
RENAL/URINARY TRACT ULTRASOUND COMPLETE

[Series 1: us renal · 14 of 26 slices shown]
[im 1/26]
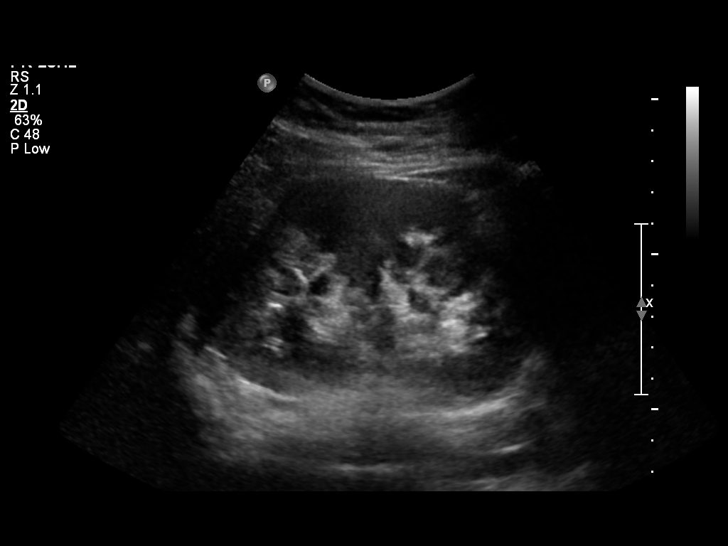
[im 3/26]
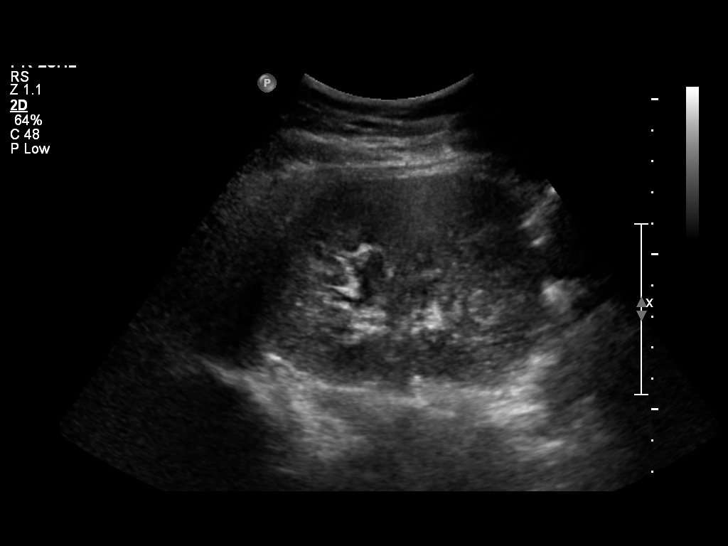
[im 5/26]
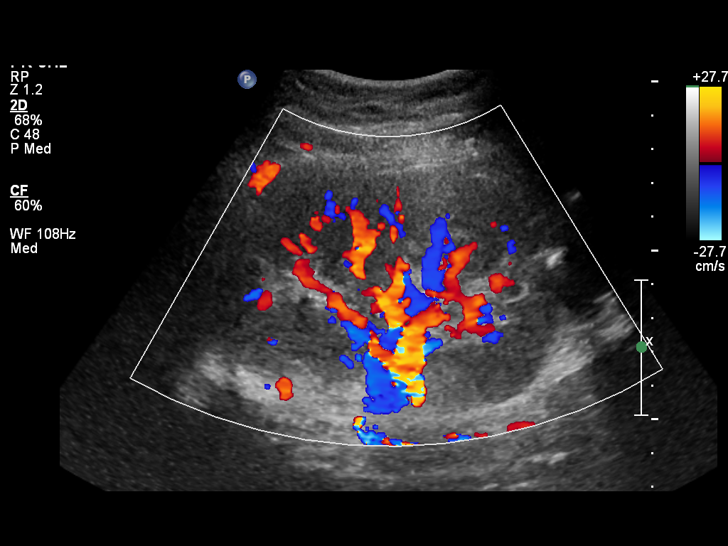
[im 7/26]
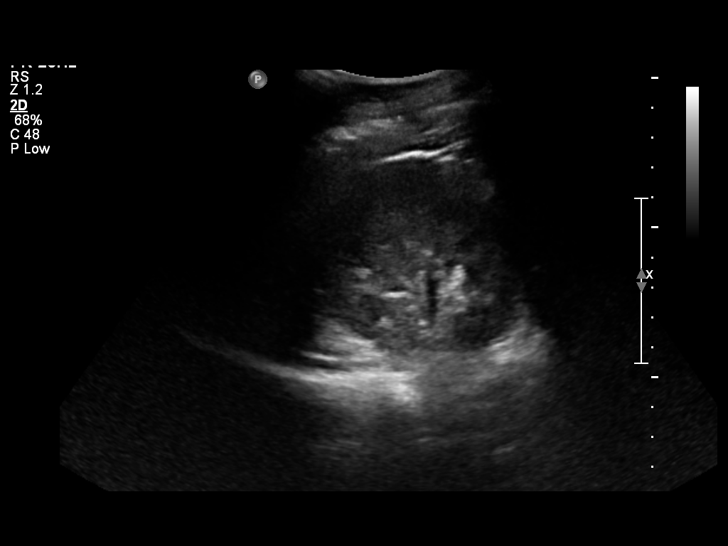
[im 9/26]
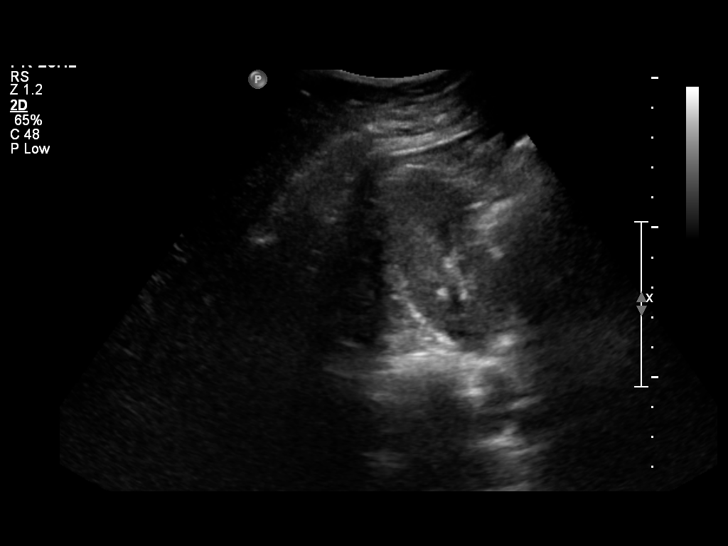
[im 10/26]
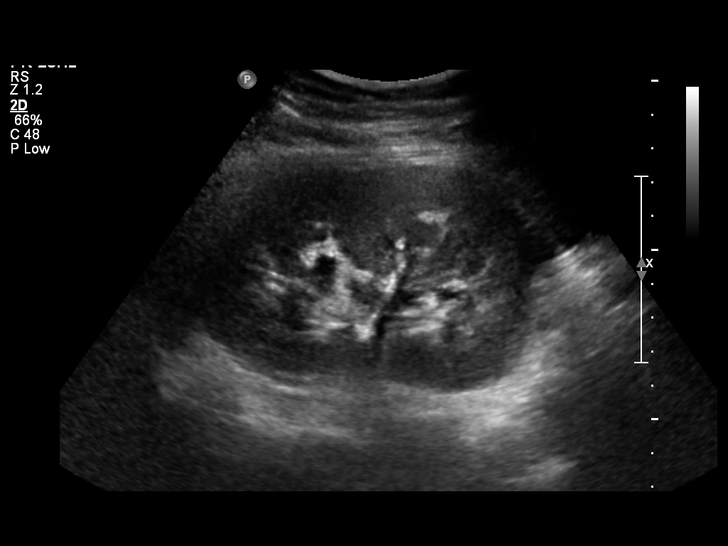
[im 12/26]
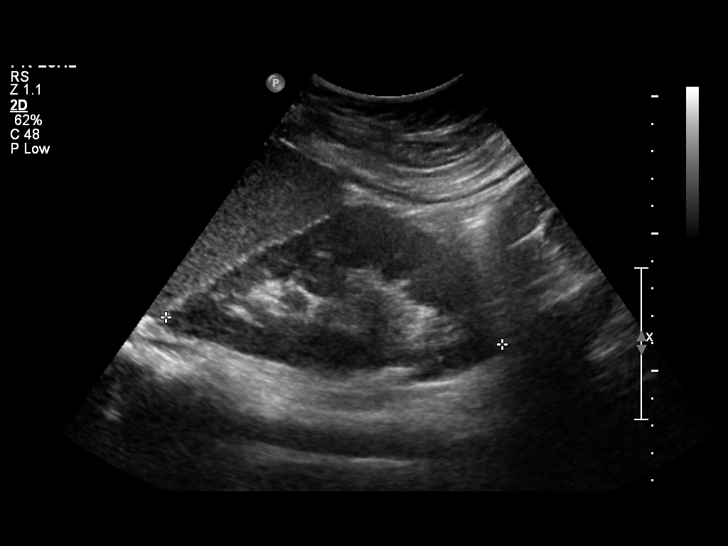
[im 14/26]
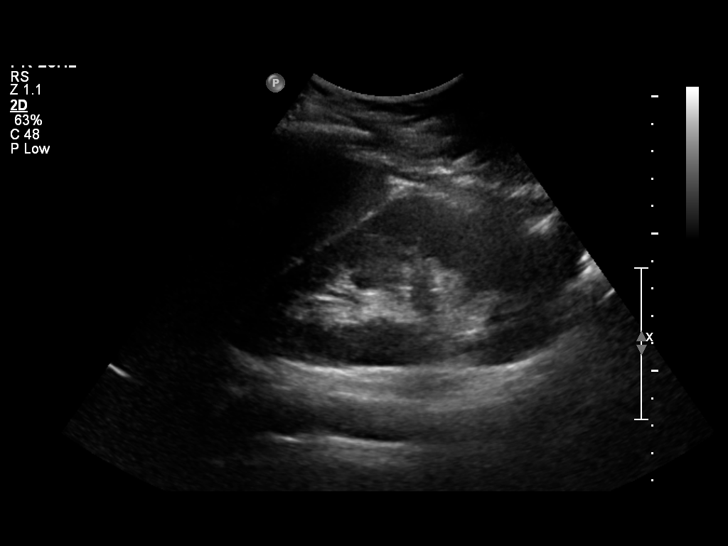
[im 16/26]
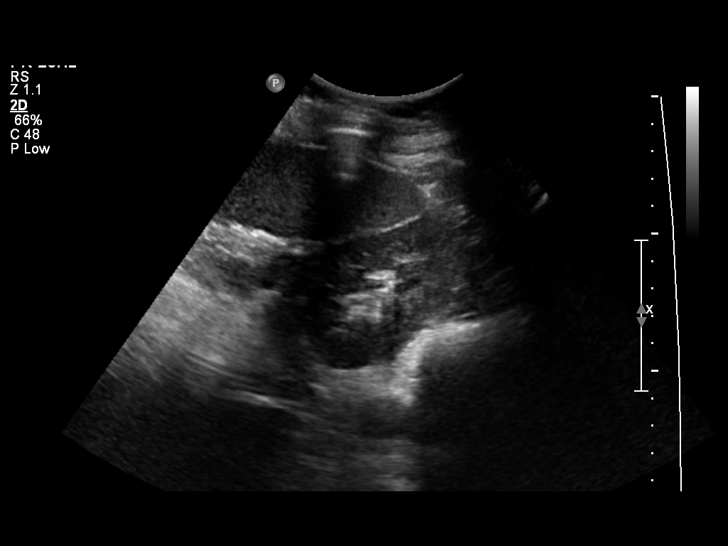
[im 17/26]
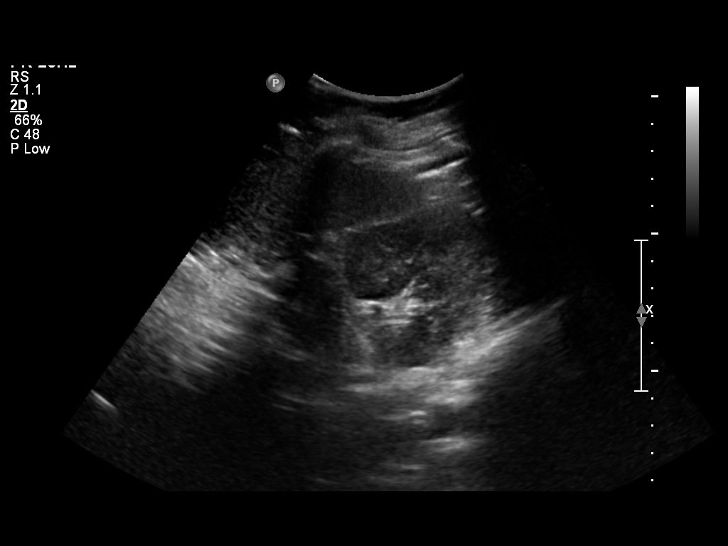
[im 19/26]
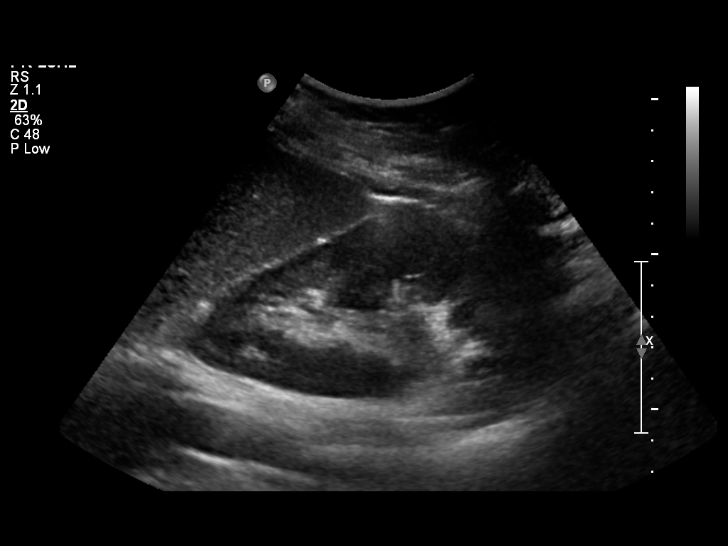
[im 21/26]
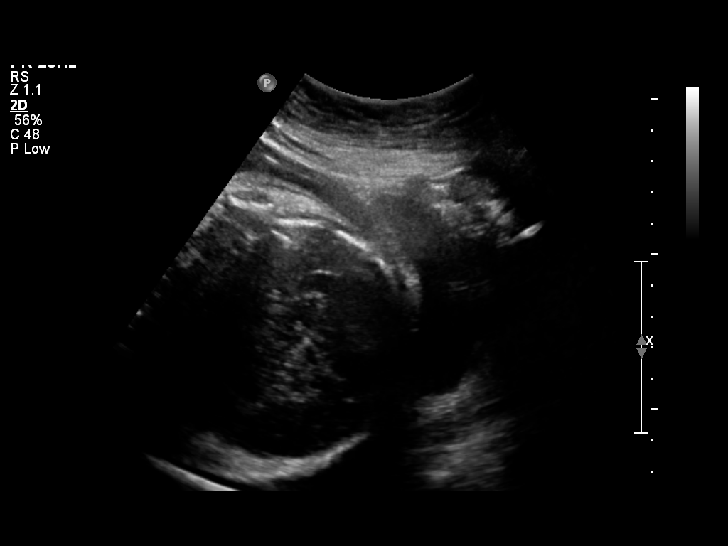
[im 23/26]
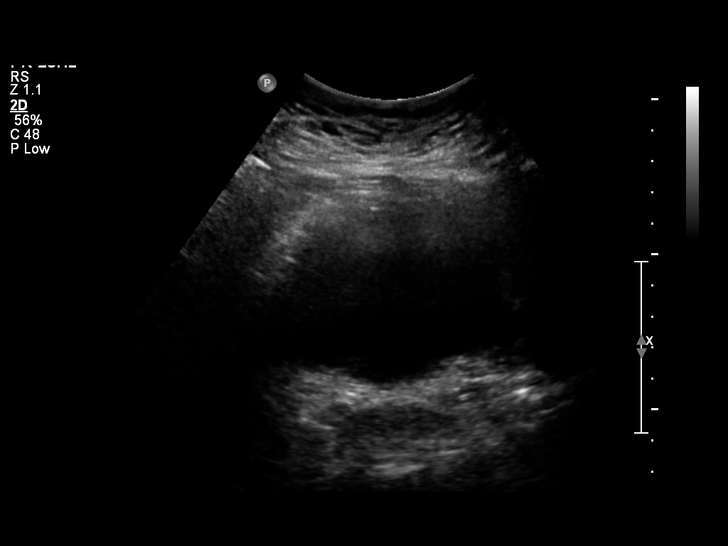
[im 26/26]
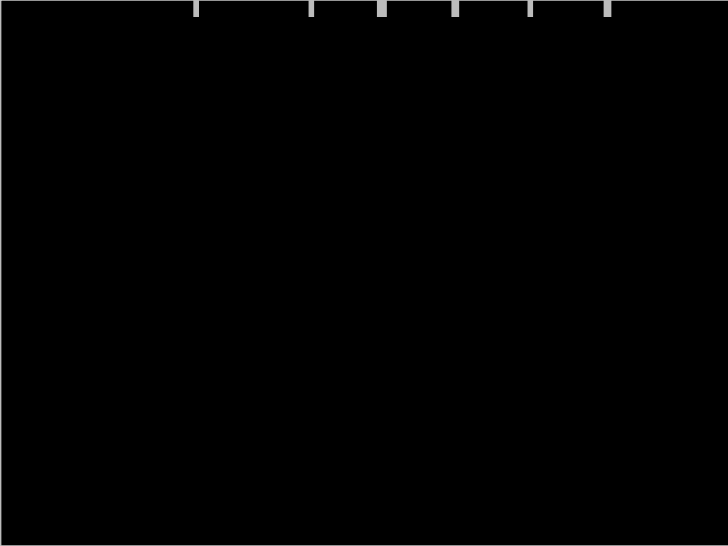

[14 of 25 positions shown; findings below may reference images not displayed]

FINDINGS: Right Kidney:

Length: 11 cm. Echogenicity within normal limits. No mass or
hydronephrosis visualized.

Left Kidney:

Length: 12 cm. Echogenicity within normal limits. No mass or
hydronephrosis visualized.

Bladder:

Appears normal for degree of bladder distention.
IMPRESSION: Negative for hydronephrosis.

## 2014-08-28 MED ORDER — LEVETIRACETAM IN NACL 1000 MG/100ML IV SOLN
1000.0000 mg | Freq: Once | INTRAVENOUS | Status: AC
  Administered 2014-08-28: 1000 mg via INTRAVENOUS
  Filled 2014-08-28: qty 100

## 2014-08-28 MED ORDER — HYDROCODONE-ACETAMINOPHEN 5-325 MG PO TABS: 1.0000 | ORAL_TABLET | Freq: Four times a day (QID) | ORAL | Status: DC | PRN

## 2014-08-28 MED ORDER — MORPHINE SULFATE 4 MG/ML IJ SOLN
4.0000 mg | Freq: Once | INTRAMUSCULAR | Status: AC
  Administered 2014-08-28: 4 mg via INTRAVENOUS
  Filled 2014-08-28: qty 1

## 2014-08-28 MED ORDER — LORAZEPAM 2 MG/ML IJ SOLN
1.0000 mg | Freq: Once | INTRAMUSCULAR | Status: AC
  Administered 2014-08-28: 1 mg via INTRAVENOUS

## 2014-08-28 MED ORDER — LORAZEPAM 2 MG/ML IJ SOLN
INTRAMUSCULAR | Status: AC
  Administered 2014-08-28: 1 mg via INTRAVENOUS
  Filled 2014-08-28: qty 1

## 2014-08-28 MED ORDER — DIPHENHYDRAMINE HCL 50 MG/ML IJ SOLN
50.0000 mg | Freq: Once | INTRAMUSCULAR | Status: AC
  Administered 2014-08-28: 50 mg via INTRAVENOUS
  Filled 2014-08-28: qty 1

## 2014-08-28 MED ORDER — PROMETHAZINE HCL 25 MG/ML IJ SOLN
25.0000 mg | Freq: Once | INTRAMUSCULAR | Status: AC
  Administered 2014-08-28: 25 mg via INTRAVENOUS
  Filled 2014-08-28: qty 1

## 2014-08-28 MED ORDER — PROMETHAZINE HCL 25 MG/ML IJ SOLN
12.5000 mg | Freq: Once | INTRAMUSCULAR | Status: AC
  Administered 2014-08-28: 12.5 mg via INTRAVENOUS
  Filled 2014-08-28: qty 1

## 2014-08-28 NOTE — ED Notes (Signed)
Keppra infusion finished 1029.

## 2014-08-28 NOTE — Progress Notes (Signed)
[facility] Visit  Patient name: Penny [name] MRN [id number]  Date of birth: Jun 11, 1986  CC & HPI:  Penny [name] is a 28 y.o. female presenting today for bleeding problems that have persisted since delivery.   PT has had a complicated course since delivery, with evaluation here [facility], and [facility] where she's had extensive eval for a seizure-like activity, extensively described in the notes on her sleep EEG done earlier this month at [facility]. Upon arrival here, pt continued having the SAME seizure-like activity in waiting room and in exam room , with pt having 6 episodes in sequence, lasting approx 1 minute,with one videotaped on husband's phone by me.. Pt with pronation  And flexion of arms onto left shouder, with guttural breathing and rigidity of upper body, with no rigidity of lower extremities ,and no loss of continence.  During first 2 episodes, pt able to inarticulately make effort to reply to question, but pt nonresponsive with 3rd and further episodes, with deviation of eyes symmetrically to left and accompanied by rotation of head to left.   Respirations quickly resume upon completion of muscle spasm and relaxation of neck and eye deviation. This looks real.   Further effort at eval of pt's gyn concern abandoned. Records from [place] had been previously reviewed. Hgb 9.8, hcg negative. EEG and MRI brain w/wo contrast reviewed.     ROS:  [facility]. CALLED YESTERDAY.   Pertinent History Reviewed:   Reviewed: Significant for gastric sleeve Medical         Past Medical History  Diagnosis Date  . Migraines   . Interstitial cystitis   . IUD FEB 2010  . Gastritis JULY 2011  . Depression   . 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 Fatty liver   . Obesity (BMI 30-39.9) 2011 228 LBS BMI 36.8  . Iron deficiency anemia 07/23/2010  . Irritable bowel syndrome 2012 DIARRHEA    JUN 2012 TTG IgA 14.9  . Elevated liver enzymes JUL 2011ALK PHOS 111-127 AST   143-267 ALT  213-321T BILI 0.6  ALB  3.7-4.06 Jun 2011 ALK PHOS 118 AST 24 ALT 42 T BILI 0.4 ALB 3.9  . Chronic daily headache   . Ovarian cyst   . PONV (postoperative nausea and vomiting)   . ADD (attention deficit disorder)   . RLQ abdominal pain 07/18/2013  . Menorrhagia 07/18/2013  . Anxiety   . Potassium (K) deficiency   . Dysmenorrhea 09/18/2013  . Stress 09/18/2013  . Patient desires pregnancy 09/18/2013  . Pregnant 12/25/2013  . Blood transfusion without reported diagnosis   . Heart murmur   . Gastric bypass status for obesity                               Surgical Hx:    Past Surgical History  Procedure Laterality Date  . Gab  2007    in [place]-POUCH 5 CM  . Cholecystectomy  2005    biliary dyskinesia  . Cath self every nite      for sodium bicarb injection (discontinued 2013)  . Colonoscopy  JUN 2012 ABD PN/DIARRHEA WITH PROPOFOL    NL COLON  . Upper gastrointestinal endoscopy  JULY 2011 NAUSEA-D125,V6, PH 25    Bx; GASTRITIS, POUCH-5 CM LONG  . Dilation and curettage of uterus    . Tonsillectomy    .  Gastric bypass  06/2006  . Savory dilation  06/20/2012    JQB:HALP gastritis/Ulcer in the mid jejunum  . Tonsillectomy and adenoidectomy    . Esophagogastroduodenoscopy    . Wisdom tooth extraction    . Repair vaginal cuff N/A 07/30/2014    Procedure: REPAIR VAGINAL CUFF;  Surgeon: Mora Bellman, MD;  Location: [facility];  Service: Gynecology;  Laterality: N/A;   Medications: Reviewed & Updated - see associated section                      Current outpatient prescriptions:acetaminophen (TYLENOL) 500 MG tablet, Take 1,000 mg by mouth every 6 (six) hours as needed for headache (pain). , Disp: , Rfl: ;  Ascorbic Acid (VITAMIN C) 100 MG CHEW, Chew 200 mg by mouth at bedtime. , Disp: , Rfl: ;  BIOTIN PO, Take 1 tablet by mouth at bedtime. , Disp: , Rfl:  butalbital-acetaminophen-caffeine (FIORICET, ESGIC) 50-325-40 MG per tablet, Take 1-2 tablets by mouth every 6 (six)  hours as needed for headache. , Disp: , Rfl: ;  Cholecalciferol (VITAMIN D3) 2000 UNITS TABS, Take 2,000 Units by mouth at bedtime. , Disp: , Rfl: ;  clonazePAM (KLONOPIN) 1 MG tablet, Take 1 mg by mouth 2 (two) times daily as needed for anxiety., Disp: , Rfl:  escitalopram (LEXAPRO) 20 MG tablet, Take 1 tablet (20 mg total) by mouth daily., Disp: 60 tablet, Rfl: 3;  lidocaine (XYLOCAINE JELLY) 2 % jelly, Apply 1 application topically as needed., Disp: 30 mL, Rfl: 2;  nortriptyline (PAMELOR) 10 MG capsule, Take 1 capsule (10 mg total) by mouth at bedtime., Disp: 30 capsule, Rfl: 11 promethazine (PHENERGAN) 25 MG tablet, Take 25 mg by mouth every 6 (six) hours as needed for nausea or vomiting., Disp: , Rfl: ;  tiZANidine (ZANAFLEX) 4 MG tablet, Take 4 mg by mouth every 6 (six) hours as needed., Disp: , Rfl: ;  zolpidem (AMBIEN) 10 MG tablet, Take 10 mg by mouth at bedtime as needed for sleep., Disp: , Rfl:    Social History: Reviewed -  reports that she quit smoking about 7 months ago. Her smoking use included Cigarettes. She smoked 0.00 packs per day for 5 years. She has never used smokeless tobacco.  Objective Findings:  Vitals: unknown if currently breastfeeding.  Physical Examination: see HPI    Assessment & Plan:   A:  1. Seizure-like activity 2. Anemia and continued irrregular bleeding s/p recent vaginal delivery complicated by retained placenta requiring manual removal in OR 3 hx gastic sleeve surgery  P:  1. Transferred to ED via EMS. Call to ED MD completed.

## 2014-08-28 NOTE — ED Notes (Addendum)
Pt resting comfortably. nad noted. Chest rise and fall symmetrical. Per pt husband, pt "usually gets a migraine cocktail that knocks her out is the only way her pain will go away." EDP aware. No new orders given.

## 2014-08-28 NOTE — ED Notes (Addendum)
Pt has second seizure that lasted approximately 30secs. Pt had another seziure immediately after previous one. Third seizure lasted approximately 30 secs as well. Pt had decorticate posturing and BUE tense. Airway patent. Speech remains garbled, studdered. EDP aware of both and reported to admin 1mg  of ativan and reported to start keppra after second admin of ativan. HR increased during both seizures. Postictal v/s wnl.

## 2014-08-28 NOTE — Telephone Encounter (Signed)
ER doctor called to discuss case

## 2014-08-28 NOTE — ED Notes (Signed)
Pt currently having seizure. EDP aware and reported to admin 1mg  of ativan. Seizure lasts for a few seconds. Pt remains tense and post ictal ever since arrival.

## 2014-08-28 NOTE — ED Notes (Signed)
EMS reports pt is 4 weeks post partum and was at Dr. Eilleen Kempf office when started having a seizure.  EMS reports that Dr. Gwen Pounds called it a nonepileptic seizure and suggested to pt that she needs a consult with a neurologist and a psychiatrist.  EMS reports pt has been nonverbal with them.  Pt lethargic upon arrival to ED but recognized one of the nurses and stated her name.  C/O headache.  Pt moaning at times.

## 2014-08-28 NOTE — Discharge Instructions (Signed)
Follow up with dr. Wolfgang [place] Friday at 11am.    Return to [facility]tist if problems before then

## 2014-08-28 NOTE — ED Notes (Signed)
Pt remains drowsy but alert and oriented. Speech clear.

## 2014-08-28 NOTE — ED Provider Notes (Signed)
CSN: [id number]     Arrival date & time 08/28/14  0935 History  This chart was scribed for Maudry Diego, MD by Ludger Nutting, ED Scribe. This patient was seen in room APA02/APA02 and the patient's care was started 9:44 AM.    Chief Complaint  Patient presents with  . Seizures    The history is provided by the spouse. The history is limited by the condition of the patient. No language interpreter was used.   LEVEL 5 CAVEAT - Seizures HPI Comments: Penny [name] is a 4 week postpartum 28 y.o. female who presents to the Emergency Department complaining of a seizure that occurred just PTA. Patient had an appointment with Dr. Glo Herring when she began having this episode. Per EMS, Dr. Glo Herring stated patient was having a nonepileptic seizure. She was seen in the ED on 08/13/14 for altered mental status and was transferred to [facility] to be seen by neurology. Per husband, patient began having seizures after delivering 4 weeks ago and has had intermittent episodes since then. Husband states patient has a history of migraines and believes the seizures to be related. He states that each time patient has a migraine HA, she has a seizure as well. He states patient was seen at [facility] recently for the same symptoms. He states patient is usually postictal for about 15-20 minutes.   Past Medical History  Diagnosis Date  . Migraines   . Interstitial cystitis   . IUD FEB 2010  . Gastritis JULY 2011  . Depression   . 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 Fatty liver   . Obesity (BMI 30-39.9) 2011 228 LBS BMI 36.8  . Iron deficiency anemia 07/23/2010  . Irritable bowel syndrome 2012 DIARRHEA    JUN 2012 TTG IgA 14.9  . Elevated liver enzymes JUL 2011ALK PHOS 111-127 AST  143-267 ALT  213-321T BILI 0.6  ALB  3.7-4.06 Jun 2011 ALK PHOS 118 AST 24 ALT 42 T BILI 0.4 ALB 3.9  . Chronic daily headache   . Ovarian cyst   . PONV (postoperative nausea and vomiting)   . ADD (attention deficit disorder)   . RLQ  abdominal pain 07/18/2013  . Menorrhagia 07/18/2013  . Anxiety   . Potassium (K) deficiency   . Dysmenorrhea 09/18/2013  . Stress 09/18/2013  . Patient desires pregnancy 09/18/2013  . Pregnant 12/25/2013  . Blood transfusion without reported diagnosis   . Heart murmur   . Gastric bypass status for obesity    Past Surgical History  Procedure Laterality Date  . Gab  2007    in [place]-POUCH 5 CM  . Cholecystectomy  2005    biliary dyskinesia  . Cath self every nite      for sodium bicarb injection (discontinued 2013)  . Colonoscopy  JUN 2012 ABD PN/DIARRHEA WITH PROPOFOL    NL COLON  . Upper gastrointestinal endoscopy  JULY 2011 NAUSEA-D125,V6, PH 25    Bx; GASTRITIS, POUCH-5 CM LONG  . Dilation and curettage of uterus    . Tonsillectomy    . Gastric bypass  06/2006  . Savory dilation  06/20/2012    JAS:NKNL gastritis/Ulcer in the mid jejunum  . Tonsillectomy and adenoidectomy    . Esophagogastroduodenoscopy    . Wisdom tooth extraction    . Repair vaginal cuff N/A 07/30/2014    Procedure: REPAIR VAGINAL CUFF;  Surgeon: Mora Bellman, MD;  Location: [facility];  Service: Gynecology;  Laterality:  N/A;   Family History  Problem Relation Age of Onset  . Hemochromatosis Maternal Grandmother   . Migraines Maternal Grandmother   . Cancer Maternal Grandmother   . Hypertension Father   . Diabetes Father   . Coronary artery disease Father   . Migraines Paternal Grandmother   . Cancer Mother     breast  . Hemochromatosis Mother   . Coronary artery disease Paternal Grandfather    History  Substance Use Topics  . Smoking status: Former Smoker -- 5 years    Types: Cigarettes    Quit date: 01/07/2014  . Smokeless tobacco: Never Used  . Alcohol Use: No   OB History   Grav Para Term Preterm Abortions TAB SAB Ect Mult Living   _0 Review of Systems  Unable to perform ROS: Mental status change      Allergies  Gabapentin; Metoclopramide hcl; Topamax;  Codeine; Latex; and Tape  Home Medications   Prior to Admission medications   Medication Sig Start Date End Date Taking? Authorizing Provider  acetaminophen (TYLENOL) 500 MG tablet Take 1,000 mg by mouth every 6 (six) hours as needed for headache (pain).     Historical Provider, MD  amLODipine (NORVASC) 10 MG tablet Take 1 tablet (10 mg total) by mouth daily. 08/01/14   Jonnie Kind, MD  Ascorbic Acid (VITAMIN C) 100 MG CHEW Chew 200 mg by mouth at bedtime.     Historical Provider, MD  BIOTIN PO Take 1 tablet by mouth at bedtime.     Historical Provider, MD  butalbital-acetaminophen-caffeine (FIORICET, ESGIC) 50-325-40 MG per tablet Take 1 tablet by mouth every 6 (six) hours as needed for headache.    Historical Provider, MD  Cholecalciferol (VITAMIN D3) 2000 UNITS TABS Take 2,000 Units by mouth at bedtime.     Historical Provider, MD  clonazePAM (KLONOPIN) 0.5 MG tablet Take 0.5 mg by mouth 2 (two) times daily.    Historical Provider, MD  escitalopram (LEXAPRO) 20 MG tablet Take 1 tablet (20 mg total) by mouth daily. 04/10/14   Florian Buff, MD  escitalopram (LEXAPRO) 20 MG tablet Take 1 tablet (20 mg total) by mouth daily. 08/22/14   Florian Buff, MD  ketorolac (TORADOL) 10 MG tablet Take 1 tablet (10 mg total) by mouth every 6 (six) hours as needed. 08/01/14   Jonnie Kind, MD  lidocaine (XYLOCAINE JELLY) 2 % jelly Apply 1 application topically as needed. 08/05/14   Florian Buff, MD  Multiple Vitamins-Minerals (ZINC PO) Take 1 tablet by mouth at bedtime.     Historical Provider, MD  nortriptyline (PAMELOR) 10 MG capsule Take 1 capsule (10 mg total) by mouth at bedtime. 08/22/14   Florian Buff, MD  nystatin (MYCOSTATIN) 100000 UNIT/ML suspension Take 5 mLs by mouth 4 (four) times daily as needed (mouth pain).    Historical Provider, MD  Prenatal Vit-Fe Fumarate-FA (PRENATAL MULTIVITAMIN) TABS Take 1 tablet by mouth at bedtime.     Historical Provider, MD  promethazine (PHENERGAN) 25 MG  tablet TAKE ONE TABLET BY MOUTH EVERY 6 HOURS AS NEEDED FOR NAUSEA. 07/10/14   Florian Buff, MD  rizatriptan (MAXALT) 10 MG tablet Take 1 tablet (10 mg total) by mouth as needed for migraine. May repeat in 2 hours if needed 08/05/14   Florian Buff, MD   BP 118/80  Pulse 101  Resp 24  SpO2 99% Physical Exam  Nursing note and vitals reviewed. Constitutional: She appears well-developed.  HENT:  Head: Normocephalic.  Eyes: Conjunctivae are normal. No scleral icterus.  Neck: Neck supple. No thyromegaly present.  Cardiovascular: Normal rate and regular rhythm.  Exam reveals no gallop and no friction rub.   No murmur heard. Pulmonary/Chest: No stridor. She has no wheezes. She has no rales. She exhibits no tenderness.  Abdominal: She exhibits no distension. There is no tenderness. There is no rebound.  Musculoskeletal: Normal range of motion. She exhibits no edema.  Lymphadenopathy:    She has no cervical adenopathy.  Neurological: She exhibits normal muscle tone.  Lethargic, responds only to painful stimuli right now   Skin: No rash noted. No erythema.  Psychiatric: She has a normal mood and affect. Her behavior is normal.    ED Course  Procedures (including critical care time)  DIAGNOSTIC STUDIES: Oxygen Saturation is 99% on RA, normal by my interpretation.    COORDINATION OF CARE: 9:51 AM Discussed treatment plan with pt at bedside and pt agreed to plan.   Labs Review Labs Reviewed - No data to display  Imaging Review No results found.   EKG Interpretation   Date/Time:  Wednesday August 28 2014 09:38:38 EDT Ventricular Rate:  99 PR Interval:  161 QRS Duration: 84 QT Interval:  347 QTC Calculation: 445 R Axis:   36 Text Interpretation:  Sinus rhythm Confirmed by Jalisa Sacco  MD, Isabela Nardelli ([id number])  on 08/28/2014 2:41:09 PM      MDM   Final diagnoses:  None   The pt has headaches and pseudosz.   I spoke with her pcp and he will see her Monday and arrange neuro and psyc  follow up  The chart was scribed for me under my direct supervision.  I personally performed the history, physical, and medical decision making and all procedures in the evaluation of this patient.Maudry Diego, MD 08/28/14 [phone]

## 2014-08-28 NOTE — Telephone Encounter (Signed)
I discussed the case with the emergency department doctor he stated that he saw the events that he was concerned was seizures. He did give her Keppra. I discussed how this patient had similar problems at [facility]. Apparently because of family request and the complexity of the case the patient was transferred to [facility]. Neurology department at [facility] did continuous EEG monitoring. This showed no EEG waves consistent with seizures during one of her spells. 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 The ER doctor felt the patient and family were at a loss of what to do. Her what was recommended by [facility] I recommend that the patient be referred back to neurology at [facility] and be referred to psychiatry at [facility]. In addition to this the ER doctor recommended that we see the patient back. Appointment was given. The ER doctor was informed that our focus would be to initiate the referrals as well as discuss this process with the patient and family when they come in. Nurses, please initiate referral for psychiatry with [facility] for mood disturbance and psychogenic seizures. Also initiate referral to neurology department at [place] referral person Izora Ribas) will need to let both facilities noted that these appointments need to be as quick as possible. Nurses, please also make sure Dr. Richardson Landry see. this note before seeing the patient on Friday

## 2014-08-28 NOTE — ED Notes (Signed)
Pt asleep,resting comfortable. V/s wnl. nad noted.

## 2014-08-28 NOTE — ED Notes (Signed)
Pt requesting something to eat/drink. EDP aware and reported to give pt food/drink, if pt tolerates well, pt will be d/c. Pt given diet soda and graham crackers. nad noted.

## 2014-08-28 NOTE — ED Notes (Signed)
Pt resting comfortably. nad noted. Chest rise and fall symmetrical.

## 2014-08-30 ENCOUNTER — Ambulatory Visit (INDEPENDENT_AMBULATORY_CARE_PROVIDER_SITE_OTHER): Payer: Medicaid Other | Admitting: Family Medicine

## 2014-08-30 ENCOUNTER — Telehealth: Payer: Self-pay | Admitting: *Deleted

## 2014-08-30 ENCOUNTER — Encounter: Payer: Self-pay | Admitting: Family Medicine

## 2014-08-30 ENCOUNTER — Other Ambulatory Visit: Payer: Self-pay | Admitting: Obstetrics and Gynecology

## 2014-08-30 VITALS — BP 112/82 | Ht 66.0 in | Wt 228.0 lb

## 2014-08-30 DIAGNOSIS — F329 Major depressive disorder, single episode, unspecified: Secondary | ICD-10-CM

## 2014-08-30 DIAGNOSIS — F445 Conversion disorder with seizures or convulsions: Secondary | ICD-10-CM

## 2014-08-30 DIAGNOSIS — F32A Depression, unspecified: Secondary | ICD-10-CM

## 2014-08-30 DIAGNOSIS — G4459 Other complicated headache syndrome: Secondary | ICD-10-CM

## 2014-08-30 DIAGNOSIS — F3289 Other specified depressive episodes: Secondary | ICD-10-CM

## 2014-08-30 DIAGNOSIS — R569 Unspecified convulsions: Secondary | ICD-10-CM

## 2014-08-30 DIAGNOSIS — F449 Dissociative and conversion disorder, unspecified: Secondary | ICD-10-CM

## 2014-08-30 MED ORDER — HYDROCODONE-ACETAMINOPHEN 5-325 MG PO TABS: 1.0000 | ORAL_TABLET | Freq: Four times a day (QID) | ORAL | Status: DC | PRN

## 2014-08-30 MED ORDER — NORTRIPTYLINE HCL 25 MG PO CAPS: 25.0000 mg | ORAL_CAPSULE | Freq: Every day | ORAL | Status: DC

## 2014-08-30 MED ORDER — ESCITALOPRAM OXALATE 20 MG PO TABS: 20.0000 mg | ORAL_TABLET | Freq: Every day | ORAL | Status: DC

## 2014-08-30 NOTE — Telephone Encounter (Signed)
I spoke with Dr.Ferguson about the pt and the problems she is having, he advised that she would need an [name] next week and then see him afterward. I called the pt and advised the pt of this and she asked if Dr.Ferguson could give her enough pain medication to last her until her appointment. Dr. Glo Herring agreed to give her enough medication to last her until her appointment. Pt aware that she will need to come by and pick up the Rx.

## 2014-08-30 NOTE — Telephone Encounter (Signed)
Pt called stating that she had seizures for 2 hours last night, and that after her seizure she got up and went to the bathroom and passed a clot that looked like placenta, or a baby, per the patient. Pt stated that she is having heavy bright red bleeding and that she had a gush of blood this morning and still passing clots. Pt states that she is now cramping on both sides not just the right side. Pt states that the pain medication is not helping her at all. Pt also stated that she has not stopped bleeding since she delivered on the 24th of August. Pt states that she is cramping, still having migraines, and sweating. Pt denies any fever. I advised the pt that I would discuss everything with Dr. Glo Herring and call her back. Pt verbalized understanding.

## 2014-08-30 NOTE — Progress Notes (Signed)
   Subjective:    Patient ID: Penny [name], female    DOB: 05-17-1986, 28 y.o.   MRN: [id number]  HPIFollow up hospital stay for seizures.  Meds need to be adjusted. Pt was taking lexapro 20mg  BIDbut was swithced to qd. Pt wants to go back BID.   Pt wants nortriptyline 10mg  switched back to 25 mg each bedtime Pt wants klonopin 1mg  one BID refilled. She was getting only #28 but she is requesting #60.   Refill on maxalt and fioricet and phenergan 25mg  tablet and ambien 10mg . P  pt only gets #15 a month and would like #30.   Patient's OB/GYN gives her hydrocodone for chronic pain.  Recently seen in emergency room for a "seizure". It was actually recorded at Dr. Johnnye Sima office. Was seen at [facility] for seizures. 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  Recently seen in emergency room. See prior notes a telephone message.  Patient Penny [name] has a followup appointment scheduled at [facility] to followup from a neurology standpoint.  Patient admits a tremendous stress of late. With pregnancy  Claims no suicidal thoughts or homicidal thoughts Review of Systems No chest pain some headache some abdominal pain with excessive bleeding no change in bowel habits no blood in stool    Objective:   Physical Exam  Alert somewhat irritable. No apparent distress H&T normal. Lungs clear. Heart regular in rhythm oriented x3 alert no seizure activity  All hospital records reviewed.      Assessment & Plan:  Impression pseudoseizures discussed #2 chronic narcotic prescription via OB/GYN. Patient has history of addiction in the past by her own admission. #3 anxiety #4 depression #5 insomnia plan very challenging case. Discussed length with patient and husband. Definitely needs behavioral health referral ASAP. They will need to manage her mental health medications long term. To followup with neurologist also, though I have advised family I. doubt they will be extremely helpful. Of note they have artery done in the  EEG during her "seizures" and it was negative. Easily 40 minutes spent in all aspects of this patient care. WSL

## 2014-09-02 ENCOUNTER — Telehealth: Payer: Self-pay | Admitting: Family Medicine

## 2014-09-02 ENCOUNTER — Telehealth: Payer: Self-pay | Admitting: Obstetrics & Gynecology

## 2014-09-02 ENCOUNTER — Other Ambulatory Visit: Payer: Self-pay | Admitting: Obstetrics and Gynecology

## 2014-09-02 ENCOUNTER — Other Ambulatory Visit: Payer: Self-pay | Admitting: Obstetrics & Gynecology

## 2014-09-02 DIAGNOSIS — N949 Unspecified condition associated with female genital organs and menstrual cycle: Secondary | ICD-10-CM

## 2014-09-02 MED ORDER — CLONAZEPAM 1 MG PO TABS: 1.0000 mg | ORAL_TABLET | Freq: Two times a day (BID) | ORAL | Status: DC

## 2014-09-02 NOTE — Telephone Encounter (Signed)
Notified patient script will be faxed to pharmacy today. F/u with neurology and psychiatry

## 2014-09-02 NOTE — Telephone Encounter (Signed)
Also, she cannot get in with psychiatry until the end of the month so she would like enough to last her.

## 2014-09-02 NOTE — Telephone Encounter (Signed)
Returning pt call in regards to RX request, Husband, Penny [name] answered phone, requested to speak with Penny [name] stated "she was having a seizure." Informed pt's Husband, Penny [name], per Dr. Glo Herring, pt will need to follow up with Dr. Lance Sell office for RX for Zanaflex and Klonopin. Pt Husband states concerned about pt not being able to get in with Behavioral Health till the end of month requesting Dr. Glo Herring to see what he can do to assist in this matter. Informed needed to discuss referral with Dr. Lance Sell office since that is who made the referral. Pt diagnosed with Psychiatric Pseudoseizures.

## 2014-09-02 NOTE — Telephone Encounter (Signed)
Pt states saw Dr. Wolfgang [place] on 08/30/2014 was referred to [facility]. Pt states has an appt at the end of October with [facility], requesting Dr. Glo Herring to prescribed a RX for Zanaflex 4 mg tablet q 6 hours as needed and Klonopin 1 mg po BID for anxiety. Pt states does not want RX for Klonopin to state PRN because she takes BID daily. Pt also stated called Dr. Lance Sell office this am for a RX for Zanaflex and Klonopin but has not heard back from their office.   Pt requesting a call back today in regards to RX refills.

## 2014-09-02 NOTE — Telephone Encounter (Signed)
May have 30 day of Klonopin to use bid as directed, caution drowsiness, keep f/u with neurology and psychiatry

## 2014-09-02 NOTE — Telephone Encounter (Signed)
I have confidence in Dr Lance Sell office to take care of this. Please let her know this is Dr Lance Sell care issue.

## 2014-09-02 NOTE — Telephone Encounter (Signed)
Patient said that her insurance will not cover the clonazePAM (KLONOPIN) 1 MG tablet as needed. She said that she takes this 2 times daily and would like this rewrote to show that so that they will cover it.     [employer]

## 2014-09-03 ENCOUNTER — Emergency Department (HOSPITAL_COMMUNITY)
Admission: EM | Admit: 2014-09-03 | Discharge: 2014-09-03 | Disposition: A | Discharge status: Home / Self Care | Payer: Medicaid Other | Attending: Emergency Medicine | Admitting: Emergency Medicine

## 2014-09-03 ENCOUNTER — Encounter (HOSPITAL_COMMUNITY): Payer: Self-pay | Admitting: Emergency Medicine

## 2014-09-03 ENCOUNTER — Emergency Department (HOSPITAL_COMMUNITY): Payer: Medicaid Other

## 2014-09-03 DIAGNOSIS — Z91040 Latex allergy status: Secondary | ICD-10-CM | POA: Diagnosis not present

## 2014-09-03 DIAGNOSIS — R000 Tachycardia, unspecified: Secondary | ICD-10-CM | POA: Insufficient documentation

## 2014-09-03 DIAGNOSIS — R569 Unspecified convulsions: Secondary | ICD-10-CM | POA: Insufficient documentation

## 2014-09-03 DIAGNOSIS — F411 Generalized anxiety disorder: Secondary | ICD-10-CM | POA: Diagnosis not present

## 2014-09-03 DIAGNOSIS — O209 Hemorrhage in early pregnancy, unspecified: Secondary | ICD-10-CM | POA: Diagnosis not present

## 2014-09-03 DIAGNOSIS — R1031 Right lower quadrant pain: Secondary | ICD-10-CM | POA: Insufficient documentation

## 2014-09-03 DIAGNOSIS — Z862 Personal history of diseases of the blood and blood-forming organs and certain disorders involving the immune mechanism: Secondary | ICD-10-CM | POA: Insufficient documentation

## 2014-09-03 DIAGNOSIS — R51 Headache: Secondary | ICD-10-CM

## 2014-09-03 DIAGNOSIS — IMO0001 Reserved for inherently not codable concepts without codable children: Secondary | ICD-10-CM | POA: Diagnosis not present

## 2014-09-03 DIAGNOSIS — Z87891 Personal history of nicotine dependence: Secondary | ICD-10-CM | POA: Insufficient documentation

## 2014-09-03 DIAGNOSIS — R519 Headache, unspecified: Secondary | ICD-10-CM

## 2014-09-03 DIAGNOSIS — R011 Cardiac murmur, unspecified: Secondary | ICD-10-CM | POA: Diagnosis not present

## 2014-09-03 DIAGNOSIS — O99340 Other mental disorders complicating pregnancy, unspecified trimester: Secondary | ICD-10-CM | POA: Insufficient documentation

## 2014-09-03 DIAGNOSIS — R509 Fever, unspecified: Secondary | ICD-10-CM | POA: Insufficient documentation

## 2014-09-03 DIAGNOSIS — O9989 Other specified diseases and conditions complicating pregnancy, childbirth and the puerperium: Secondary | ICD-10-CM | POA: Diagnosis not present

## 2014-09-03 DIAGNOSIS — E669 Obesity, unspecified: Secondary | ICD-10-CM | POA: Insufficient documentation

## 2014-09-03 DIAGNOSIS — R300 Dysuria: Secondary | ICD-10-CM | POA: Insufficient documentation

## 2014-09-03 DIAGNOSIS — F445 Conversion disorder with seizures or convulsions: Secondary | ICD-10-CM

## 2014-09-03 DIAGNOSIS — G43909 Migraine, unspecified, not intractable, without status migrainosus: Secondary | ICD-10-CM | POA: Diagnosis not present

## 2014-09-03 DIAGNOSIS — G8929 Other chronic pain: Secondary | ICD-10-CM

## 2014-09-03 LAB — CBC WITH DIFFERENTIAL/PLATELET
Basophils Absolute: 0 10*3/uL (ref 0.0–0.1)
Basophils Relative: 0 % (ref 0–1)
Eosinophils Absolute: 0.2 10*3/uL (ref 0.0–0.7)
Eosinophils Relative: 2 % (ref 0–5)
HCT: 31 % — ABNORMAL LOW (ref 36.0–46.0)
HEMOGLOBIN: 10.2 g/dL — AB (ref 12.0–15.0)
Lymphocytes Relative: 25 % (ref 12–46)
Lymphs Abs: 1.9 10*3/uL (ref 0.7–4.0)
MCH: 29.6 pg (ref 26.0–34.0)
MCHC: 32.9 g/dL (ref 30.0–36.0)
MCV: 89.9 fL (ref 78.0–100.0)
MONOS PCT: 13 % — AB (ref 3–12)
Monocytes Absolute: 0.9 10*3/uL (ref 0.1–1.0)
NEUTROS ABS: 4.5 10*3/uL (ref 1.7–7.7)
Neutrophils Relative %: 60 % (ref 43–77)
Platelets: 244 10*3/uL (ref 150–400)
RBC: 3.45 MIL/uL — AB (ref 3.87–5.11)
RDW: 14.2 % (ref 11.5–15.5)
WBC: 7.4 10*3/uL (ref 4.0–10.5)

## 2014-09-03 LAB — BASIC METABOLIC PANEL
ANION GAP: 11 (ref 5–15)
BUN: 7 mg/dL (ref 6–23)
CHLORIDE: 102 meq/L (ref 96–112)
CO2: 25 mEq/L (ref 19–32)
Calcium: 8.8 mg/dL (ref 8.4–10.5)
Creatinine, Ser: 0.44 mg/dL — ABNORMAL LOW (ref 0.50–1.10)
GFR calc Af Amer: >90 mL/min (ref >90)
GFR calc non Af Amer: >90 mL/min (ref >90)
Glucose, Bld: 102 mg/dL — ABNORMAL HIGH (ref 70–99)
Potassium: 4.2 mEq/L (ref 3.7–5.3)
SODIUM: 138 meq/L (ref 137–147)

## 2014-09-03 LAB — URINALYSIS, ROUTINE W REFLEX MICROSCOPIC
Bilirubin Urine: NEGATIVE
Glucose, UA: NEGATIVE mg/dL
Ketones, ur: NEGATIVE mg/dL
NITRITE: NEGATIVE
Protein, ur: NEGATIVE mg/dL
Specific Gravity, Urine: <1.005 — ABNORMAL LOW (ref 1.005–1.030)
Urobilinogen, UA: 0.2 mg/dL (ref 0.0–1.0)
pH: 6 (ref 5.0–8.0)

## 2014-09-03 LAB — URINE MICROSCOPIC-ADD ON

## 2014-09-03 MED ORDER — IOHEXOL 300 MG/ML  SOLN
100.0000 mL | Freq: Once | INTRAMUSCULAR | Status: AC | PRN
  Administered 2014-09-03: 100 mL via INTRAVENOUS

## 2014-09-03 MED ORDER — SODIUM CHLORIDE 0.9 % IV SOLN: INTRAVENOUS | Status: DC

## 2014-09-03 MED ORDER — ONDANSETRON HCL 4 MG/2ML IJ SOLN
4.0000 mg | Freq: Once | INTRAMUSCULAR | Status: AC
  Administered 2014-09-03: 4 mg via INTRAVENOUS
  Filled 2014-09-03: qty 2

## 2014-09-03 MED ORDER — KETOROLAC TROMETHAMINE 30 MG/ML IJ SOLN
30.0000 mg | Freq: Once | INTRAMUSCULAR | Status: AC
  Administered 2014-09-03: 30 mg via INTRAVENOUS
  Filled 2014-09-03: qty 1

## 2014-09-03 MED ORDER — DEXAMETHASONE SODIUM PHOSPHATE 4 MG/ML IJ SOLN
10.0000 mg | Freq: Once | INTRAMUSCULAR | Status: AC
  Administered 2014-09-03: 10 mg via INTRAVENOUS
  Filled 2014-09-03: qty 3

## 2014-09-03 MED ORDER — IOHEXOL 300 MG/ML  SOLN
50.0000 mL | Freq: Once | INTRAMUSCULAR | Status: AC | PRN
  Administered 2014-09-03: 50 mL via ORAL

## 2014-09-03 MED ORDER — PROMETHAZINE HCL 25 MG/ML IJ SOLN
12.5000 mg | Freq: Once | INTRAMUSCULAR | Status: AC
  Administered 2014-09-03: 12.5 mg via INTRAVENOUS
  Filled 2014-09-03: qty 1

## 2014-09-03 MED ORDER — DIPHENHYDRAMINE HCL 50 MG/ML IJ SOLN
25.0000 mg | Freq: Once | INTRAMUSCULAR | Status: AC
  Administered 2014-09-03: 25 mg via INTRAVENOUS
  Filled 2014-09-03: qty 1

## 2014-09-03 MED ORDER — SODIUM CHLORIDE 0.9 % IV BOLUS (SEPSIS)
1000.0000 mL | Freq: Once | INTRAVENOUS | Status: AC
  Administered 2014-09-03: 1000 mL via INTRAVENOUS

## 2014-09-03 NOTE — ED Notes (Signed)
In to medicate pt. Pt refused states she is going home. EDP in room with pt

## 2014-09-03 NOTE — ED Notes (Signed)
Discharge instructions and follow up care understanding verbalized by patient. No concerns voiced.

## 2014-09-03 NOTE — ED Notes (Signed)
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 States they are associated with migraines. Pt has been having seizures off and on for a month now. Pt is not on any medication to prevent seizures.

## 2014-09-03 NOTE — ED Provider Notes (Addendum)
CSN: [id number]     Arrival date & time 09/03/14  1554 History   This chart was scribed for Fredia Sorrow, MD, by Neta Ehlers, ED Scribe. This patient was seen in room APA09/APA09 and the patient's care was started at 4:12 PM.  First MD Initiated Contact with Patient 09/03/14 1603     Chief Complaint  Patient presents with  . Seizures    Patient is a 28 y.o. female presenting with seizures. The history is provided by the patient and a relative. No language interpreter was used.  Seizures Seizure activity on arrival: no   Seizure type:  Unable to specify Preceding symptoms: headache   Initial focality:  Unable to specify Episode characteristics: no apnea and no incontinence   Return to baseline: no   Severity:  Mild Timing:  Regular Progression:  Improving Context: pregnancy   PTA treatment:  None History of seizures: yes    HPI Comments: Penny [name] is a 28 y.o. female, with a h/o migraines, who presents to the Emergency Department complaining of a seizure which occurred at home PTA. The pt has a recent h/o seizures which have been associated with headaches, and the pt reports a left-sided headache today prior the seizure. The headache is associated with blurred vision and nausea. She denies emesis. She used phenergan and percocet without resolution. The pt is unable to provide any more details concerning the seizure. Her cousin, who brought the pt to the ED today, states the seizures have occurred regularly since the birth of pt's infant; the pt is four weeks postpartum. The cousin also states the seizures can last for ten minutes to several hours. Per medical records, the pt has been diagnosed with pseudo-seizures. She was seen here on 9/23  by Dr. Roderic Palau and she was seen by her PCP, Dr. Wolfgang Phoenix, on 9/26; they also confirm diagnoses of pseudo-seizures. The pt has an appointment with neurology in October.   The pt also endorses myalgia including two weeks of RLQ abdominal pain; she  has an [name] scheduled in three days.  She saw Dr. Glo Herring, her OBGYN, last week; the pt reports vaginal bleeding.   Her oxygen saturation is 97%.     Past Medical History  Diagnosis Date  . Migraines   . Interstitial cystitis   . IUD FEB 2010  . Gastritis JULY 2011  . Depression   . 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 Fatty liver   . Obesity (BMI 30-39.9) 2011 228 LBS BMI 36.8  . Iron deficiency anemia 07/23/2010  . Irritable bowel syndrome 2012 DIARRHEA    JUN 2012 TTG IgA 14.9  . Elevated liver enzymes JUL 2011ALK PHOS 111-127 AST  143-267 ALT  213-321T BILI 0.6  ALB  3.7-4.06 Jun 2011 ALK PHOS 118 AST 24 ALT 42 T BILI 0.4 ALB 3.9  . Chronic daily headache   . Ovarian cyst   . PONV (postoperative nausea and vomiting)   . ADD (attention deficit disorder)   . RLQ abdominal pain 07/18/2013  . Menorrhagia 07/18/2013  . Anxiety   . Potassium (K) deficiency   . Dysmenorrhea 09/18/2013  . Stress 09/18/2013  . Patient desires pregnancy 09/18/2013  . Pregnant 12/25/2013  . Blood transfusion without reported diagnosis   . Heart murmur   . Gastric bypass status for obesity    Past Surgical History  Procedure Laterality Date  . Gab  2007    in [place]-POUCH 5  CM  . Cholecystectomy  2005    biliary dyskinesia  . Cath self every nite      for sodium bicarb injection (discontinued 2013)  . Colonoscopy  JUN 2012 ABD PN/DIARRHEA WITH PROPOFOL    NL COLON  . Upper gastrointestinal endoscopy  JULY 2011 NAUSEA-D125,V6, PH 25    Bx; GASTRITIS, POUCH-5 CM LONG  . Dilation and curettage of uterus    . Tonsillectomy    . Gastric bypass  06/2006  . Savory dilation  06/20/2012    BHA:LPFX gastritis/Ulcer in the mid jejunum  . Tonsillectomy and adenoidectomy    . Esophagogastroduodenoscopy    . Wisdom tooth extraction    . Repair vaginal cuff N/A 07/30/2014    Procedure: REPAIR VAGINAL CUFF;  Surgeon: Mora Bellman, MD;  Location: [facility];  Service: Gynecology;  Laterality: N/A;    Family History  Problem Relation Age of Onset  . Hemochromatosis Maternal Grandmother   . Migraines Maternal Grandmother   . Cancer Maternal Grandmother   . Hypertension Father   . Diabetes Father   . Coronary artery disease Father   . Migraines Paternal Grandmother   . Cancer Mother     breast  . Hemochromatosis Mother   . Coronary artery disease Paternal Grandfather    History  Substance Use Topics  . Smoking status: Former Smoker -- 5 years    Types: Cigarettes    Quit date: 01/07/2014  . Smokeless tobacco: Never Used  . Alcohol Use: No   OB History   Grav Para Term Preterm Abortions TAB SAB Ect Mult Living   '3 2 1 1 1  1   2     '$ Review of Systems  Constitutional: Positive for fever.  HENT: Negative for rhinorrhea and sore throat.   Eyes: Positive for visual disturbance.  Respiratory: Negative for cough and shortness of breath.   Cardiovascular: Negative for chest pain and leg swelling.  Gastrointestinal: Positive for nausea and abdominal pain. Negative for vomiting and diarrhea.  Genitourinary: Positive for dysuria (Onset yesterday ) and vaginal bleeding.  Musculoskeletal: Positive for myalgias.  Skin: Negative for rash.  Neurological: Positive for seizures and headaches.  Hematological: Does not bruise/bleed easily.    Allergies  Gabapentin; Metoclopramide hcl; Topamax; Codeine; Latex; and Tape  Home Medications   Prior to Admission medications   Medication Sig Start Date End Date Taking? Authorizing Provider  Ascorbic Acid (VITAMIN C) 100 MG CHEW Chew 200 mg by mouth at bedtime.    Yes Historical Provider, MD  BIOTIN PO Take 1 tablet by mouth at bedtime.    Yes Historical Provider, MD  butalbital-acetaminophen-caffeine (FIORICET, ESGIC) 50-325-40 MG per tablet Take 1-2 tablets by mouth every 6 (six) hours as needed for headache.    Yes Historical Provider, MD  Cholecalciferol (VITAMIN D3) 2000 UNITS TABS Take 2,000 Units by mouth at bedtime.    Yes  Historical Provider, MD  clonazePAM (KLONOPIN) 1 MG tablet Take 1 tablet (1 mg total) by mouth 2 (two) times daily. As directed, caution drowsiness 09/02/14  Yes Kathyrn Drown, MD  escitalopram (LEXAPRO) 20 MG tablet Take 1 tablet (20 mg total) by mouth daily. 08/30/14  Yes Mikey Kirschner, MD  HYDROcodone-acetaminophen (NORCO/VICODIN) 5-325 MG per tablet Take 1 tablet by mouth every 6 (six) hours as needed (headache). 08/30/14  Yes Jonnie Kind, MD  lidocaine (XYLOCAINE JELLY) 2 % jelly Apply 1 application topically as needed. 08/05/14  Yes Florian Buff, MD  nortriptyline [facility])  25 MG capsule Take 1 capsule (25 mg total) by mouth at bedtime. 08/30/14  Yes Mikey Kirschner, MD  promethazine (PHENERGAN) 25 MG tablet Take 25 mg by mouth every 6 (six) hours as needed for nausea or vomiting.   Yes Historical Provider, MD  rizatriptan (MAXALT) 10 MG tablet Take 10 mg by mouth as needed for migraine. May repeat in 2 hours if needed   Yes Historical Provider, MD  zolpidem (AMBIEN) 10 MG tablet Take 10 mg by mouth at bedtime as needed for sleep.   Yes Historical Provider, MD  acetaminophen (TYLENOL) 500 MG tablet Take 1,000 mg by mouth every 6 (six) hours as needed for headache (pain).     Historical Provider, MD   Triage Vitals: BP 117/81  Pulse 103  Temp(Src) 98.3 F (36.8 C) (Oral)  Resp 22  Ht $R'5\' 6"'aQ$  (1.676 m)  Wt 213 lb (96.616 kg)  BMI 34.40 kg/m2  SpO2 97%  Physical Exam  Constitutional: She appears well-nourished.  HENT:  Head: Normocephalic and atraumatic.  Eyes: Conjunctivae and EOM are normal.  Neck: Normal range of motion.  Cardiovascular: Normal rate and regular rhythm.   Slightly tachycardic.   Pulmonary/Chest: Effort normal and breath sounds normal. No respiratory distress. She has no wheezes. She has no rales.  Abdominal: Bowel sounds are normal. There is tenderness.  Tenderness to RLQ. Non-tender elsewhere.   Musculoskeletal: She exhibits no edema.  Neurological: She is  alert.  Unable to track.  Moves tongue well.   Skin: Skin is warm and dry.    ED Course  Procedures (including critical care time)  DIAGNOSTIC STUDIES: Oxygen Saturation is 97% on room air, normal by my interpretation.    COORDINATION OF CARE:  4:24 PM- Discussed treatment plan with patient, and the patient agreed to the plan. The plan includes lab work, IV fluids, pain medication, and CT head and abdomen scans.   Results for orders placed during the hospital encounter of 67/67/20  BASIC METABOLIC PANEL      Result Value Ref Range   Sodium 138  137 - 147 mEq/L   Potassium 4.2  3.7 - 5.3 mEq/L   Chloride 102  96 - 112 mEq/L   CO2 25  19 - 32 mEq/L   Glucose, Bld 102 (*) 70 - 99 mg/dL   BUN 7  6 - 23 mg/dL   Creatinine, Ser 0.44 (*) 0.50 - 1.10 mg/dL   Calcium 8.8  8.4 - 10.5 mg/dL   GFR calc non Af Amer >90  >90 mL/min   GFR calc Af Amer >90  >90 mL/min   Anion gap 11  5 - 15  CBC WITH DIFFERENTIAL      Result Value Ref Range   WBC 7.4  4.0 - 10.5 K/uL   RBC 3.45 (*) 3.87 - 5.11 MIL/uL   Hemoglobin 10.2 (*) 12.0 - 15.0 g/dL   HCT 31.0 (*) 36.0 - 46.0 %   MCV 89.9  78.0 - 100.0 fL   MCH 29.6  26.0 - 34.0 pg   MCHC 32.9  30.0 - 36.0 g/dL   RDW 14.2  11.5 - 15.5 %   Platelets 244  150 - 400 K/uL   Neutrophils Relative % 60  43 - 77 %   Neutro Abs 4.5  1.7 - 7.7 K/uL   Lymphocytes Relative 25  12 - 46 %   Lymphs Abs 1.9  0.7 - 4.0 K/uL   Monocytes Relative 13 (*) 3 - 12 %  Monocytes Absolute 0.9  0.1 - 1.0 K/uL   Eosinophils Relative 2  0 - 5 %   Eosinophils Absolute 0.2  0.0 - 0.7 K/uL   Basophils Relative 0  0 - 1 %   Basophils Absolute 0.0  0.0 - 0.1 K/uL    Imaging Review Ct Head Wo Contrast  09/03/2014   CLINICAL DATA:  Left-sided migraine. Seizures. Blurred vision. Four weeks postpartum.  EXAM: CT HEAD WITHOUT CONTRAST  TECHNIQUE: Contiguous axial images were obtained from the base of the skull through the vertex without intravenous contrast.  COMPARISON:   Brain MR of 08/14/2014.  CT of the head of 08/13/2014.  FINDINGS: Sinuses/Soft tissues: Clear paranasal sinuses and mastoid air cells.  Intracranial: Mild motion degradation. These portions were repeated.  No mass lesion, hemorrhage, hydrocephalus, acute infarct, intra-axial, or extra-axial fluid collection.  IMPRESSION: Normal head CT.   Electronically Signed   By: Abigail Miyamoto M.D.   On: 09/03/2014 18:05   Ct Abdomen Pelvis W Contrast  09/03/2014   CLINICAL DATA:  Left-sided migraine headache with blurred vision, nausea, right lower quadrant pain and fever. History of gastric bypass in 2007. Patient is 4 weeks postpartum.  EXAM: CT ABDOMEN AND PELVIS WITH CONTRAST  TECHNIQUE: Multidetector CT imaging of the abdomen and pelvis was performed using the standard protocol following bolus administration of intravenous contrast.  CONTRAST:  74mL OMNIPAQUE IOHEXOL 300 MG/ML SOLN, 114mL OMNIPAQUE IOHEXOL 300 MG/ML SOLN  COMPARISON:  Lumbar MRI 07/30/2014.  Abdominal pelvic CT 11/13/2012.  FINDINGS: Lung bases: Minimal bibasilar atelectasis. No pleural or pericardial effusion.  Liver/Biliary/Pancreas: The liver is normal in density without focal abnormality. There is stable mild prominence of the extrahepatic biliary system status post cholecystectomy. The pancreas appears normal.  Spleen/Adrenal glands: Unremarkable.  Kidneys/Ureters/Bladder: Both kidneys appear normal. There is no hydronephrosis or evidence of urinary tract calculus. The bladder appears normal.  Bowel/Peritoneum: There are stable postsurgical changes status post gastric bypass. There is no small bowel distension or wall thickening. There is prominent stool throughout the colon. The appendix is probably visualized in the central abdomen, best seen on coronal image 41. No pelvic inflammatory changes are seen. No ascites or peritoneal nodularity.  Retroperitoneum/Pelvis: There are no enlarged abdominal or pelvic lymph nodes. There are mildly prominent  mesenteric lymph nodes which are probably reactive. No significant vascular findings are present. The postpartum uterus has decreased in size compared with the prior lumbar MRI. The endometrial cavity remains distended with heterogeneous material, some of which demonstrates increased density. There is no adnexal mass.  Abdominal wall: No abdominal wall masses or hernias.  Musculoskeletal: No acute or significant osseus findings.  IMPRESSION: 1. Prominent stool throughout the colon suggesting constipation. No evidence of bowel obstruction or appendicitis. 2. There is high-density material within the distended endometrial cavity status post recent childbirth. Findings could be secondary to retained products of conception. Correlation with beta HCG levels and consideration of follow-up pelvic ultrasound suggested.   Electronically Signed   By: Camie Patience M.D.   On: 09/03/2014 18:14     EKG Interpretation   Date/Time:  Tuesday September 03 2014 16:04:22 EDT Ventricular Rate:  103 PR Interval:  157 QRS Duration: 90 QT Interval:  347 QTC Calculation: 366 R Axis:   19 Text Interpretation:  Sinus tachycardia No significant change since last  tracing Confirmed by Finas Delone  MD, Greycen Felter [phone]) on 09/03/2014 5:09:42 PM       Date: 09/03/2014  Rate: 103  Rhythm: sinus tachycardia  QRS Axis: normal  Intervals: normal  ST/T Wave abnormalities: normal  Conduction Disutrbances:none  Narrative Interpretation:   Old EKG Reviewed: none available     MDM   Final diagnoses:  Pseudoseizures  Chronic intractable headache, unspecified headache type    Patient states no significant improvement with migraine cocktail. Which included Decadron Benadryl sure he had Phenergan at home repeat Phenergan here and Toradol. CT of head without any significant findings. Also CT of abdomen and pelvis without any significant findings to explain the right lower corner and pain. Her some fullness in the uterus. Results  of that can be followed up by her OB/GYN Dr. Glo Herring. This is a complicated situation basically was been persistent headache some days worse than others since the beginning of September. She is in a postpartum state. Possibly could be some postpartum depression. Patient also having pseudoseizures. [facility] and then [facility]. Her neurology followup is with [facility]. Patient pushes very hard for her chronic pain medicine. I feel it is not in her best interest. It is not likely to help her headaches. She has been restarted on Maxalt by her primary care doctor but she claims is not helping. It's helped her migraines that existed prior to her pregnancy. She has followup with her primary care doctor tomorrow. Mother is going to go with her. They are going to inquire to see if perhaps contacting neurology at [facility] can get her seen sooner since she's having more problems. And patient is also on nortriptyline. So patient is being treated with nortriptyline and with Maxalt.  Although patient states that she did not significant improve. Any of the seizure seizure activity has ceased. The patient is alert and oriented and does appear to be feeling a little bit better. Apparently patient's not been headache free except for 3 day window following narcotic pain medicine for the entire month.  I personally performed the services described in this documentation, which was scribed in my presence. The recorded information has been reviewed and is accurate.       Fredia Sorrow, MD 09/03/14 1906  Fredia Sorrow, MD 09/03/14 1910

## 2014-09-03 NOTE — Discharge Instructions (Signed)
Followup with the your primary care Dr. as scheduled small. Inquiry about earlier followup with [facility] neurology. It also probably need psychiatric behavioral health followup for the pseudoseizures as well. Continue current medications. Return for any new or worse symptoms. CT scan of abdomen today was completed. He can followup with Dr. Johnnye Sima office they can review the results.

## 2014-09-04 ENCOUNTER — Emergency Department (HOSPITAL_COMMUNITY)
Admission: EM | Admit: 2014-09-04 | Discharge: 2014-09-05 | Disposition: A | Discharge status: Home / Self Care | Payer: Medicaid Other | Attending: Emergency Medicine | Admitting: Emergency Medicine

## 2014-09-04 ENCOUNTER — Ambulatory Visit (INDEPENDENT_AMBULATORY_CARE_PROVIDER_SITE_OTHER): Payer: Medicaid Other | Admitting: Family Medicine

## 2014-09-04 ENCOUNTER — Encounter (HOSPITAL_COMMUNITY): Payer: Self-pay | Admitting: Emergency Medicine

## 2014-09-04 DIAGNOSIS — Z87891 Personal history of nicotine dependence: Secondary | ICD-10-CM | POA: Insufficient documentation

## 2014-09-04 DIAGNOSIS — Z9884 Bariatric surgery status: Secondary | ICD-10-CM | POA: Insufficient documentation

## 2014-09-04 DIAGNOSIS — Z91040 Latex allergy status: Secondary | ICD-10-CM | POA: Diagnosis not present

## 2014-09-04 DIAGNOSIS — Z8719 Personal history of other diseases of the digestive system: Secondary | ICD-10-CM | POA: Diagnosis not present

## 2014-09-04 DIAGNOSIS — F3289 Other specified depressive episodes: Secondary | ICD-10-CM | POA: Diagnosis not present

## 2014-09-04 DIAGNOSIS — Z87448 Personal history of other diseases of urinary system: Secondary | ICD-10-CM | POA: Insufficient documentation

## 2014-09-04 DIAGNOSIS — Z975 Presence of (intrauterine) contraceptive device: Secondary | ICD-10-CM | POA: Insufficient documentation

## 2014-09-04 DIAGNOSIS — R011 Cardiac murmur, unspecified: Secondary | ICD-10-CM | POA: Insufficient documentation

## 2014-09-04 DIAGNOSIS — F329 Major depressive disorder, single episode, unspecified: Secondary | ICD-10-CM | POA: Diagnosis not present

## 2014-09-04 DIAGNOSIS — F445 Conversion disorder with seizures or convulsions: Secondary | ICD-10-CM

## 2014-09-04 DIAGNOSIS — F53 Postpartum depression: Secondary | ICD-10-CM

## 2014-09-04 DIAGNOSIS — F419 Anxiety disorder, unspecified: Secondary | ICD-10-CM | POA: Insufficient documentation

## 2014-09-04 DIAGNOSIS — G43909 Migraine, unspecified, not intractable, without status migrainosus: Secondary | ICD-10-CM | POA: Insufficient documentation

## 2014-09-04 DIAGNOSIS — R569 Unspecified convulsions: Secondary | ICD-10-CM

## 2014-09-04 DIAGNOSIS — F332 Major depressive disorder, recurrent severe without psychotic features: Secondary | ICD-10-CM

## 2014-09-04 DIAGNOSIS — Z862 Personal history of diseases of the blood and blood-forming organs and certain disorders involving the immune mechanism: Secondary | ICD-10-CM | POA: Insufficient documentation

## 2014-09-04 DIAGNOSIS — Z8759 Personal history of other complications of pregnancy, childbirth and the puerperium: Secondary | ICD-10-CM | POA: Insufficient documentation

## 2014-09-04 DIAGNOSIS — F411 Generalized anxiety disorder: Secondary | ICD-10-CM | POA: Diagnosis not present

## 2014-09-04 DIAGNOSIS — Z79899 Other long term (current) drug therapy: Secondary | ICD-10-CM | POA: Diagnosis not present

## 2014-09-04 DIAGNOSIS — Z8742 Personal history of other diseases of the female genital tract: Secondary | ICD-10-CM | POA: Diagnosis not present

## 2014-09-04 DIAGNOSIS — O99345 Other mental disorders complicating the puerperium: Secondary | ICD-10-CM

## 2014-09-04 LAB — CBC WITH DIFFERENTIAL/PLATELET
BASOS PCT: 0 % (ref 0–1)
Basophils Absolute: 0 10*3/uL (ref 0.0–0.1)
EOS ABS: 0 10*3/uL (ref 0.0–0.7)
Eosinophils Relative: 0 % (ref 0–5)
HCT: 27.9 % — ABNORMAL LOW (ref 36.0–46.0)
Hemoglobin: 9.1 g/dL — ABNORMAL LOW (ref 12.0–15.0)
Lymphocytes Relative: 25 % (ref 12–46)
Lymphs Abs: 1.9 10*3/uL (ref 0.7–4.0)
MCH: 29.5 pg (ref 26.0–34.0)
MCHC: 32.6 g/dL (ref 30.0–36.0)
MCV: 90.6 fL (ref 78.0–100.0)
Monocytes Absolute: 0.8 10*3/uL (ref 0.1–1.0)
Monocytes Relative: 11 % (ref 3–12)
NEUTROS PCT: 64 % (ref 43–77)
Neutro Abs: 4.8 10*3/uL (ref 1.7–7.7)
Platelets: 227 10*3/uL (ref 150–400)
RBC: 3.08 MIL/uL — ABNORMAL LOW (ref 3.87–5.11)
RDW: 14 % (ref 11.5–15.5)
WBC: 7.4 10*3/uL (ref 4.0–10.5)

## 2014-09-04 LAB — BASIC METABOLIC PANEL
ANION GAP: 13 (ref 5–15)
BUN: 8 mg/dL (ref 6–23)
CALCIUM: 8.6 mg/dL (ref 8.4–10.5)
CO2: 24 mEq/L (ref 19–32)
Chloride: 104 mEq/L (ref 96–112)
Creatinine, Ser: 0.4 mg/dL — ABNORMAL LOW (ref 0.50–1.10)
GFR calc Af Amer: >90 mL/min (ref >90)
Glucose, Bld: 88 mg/dL (ref 70–99)
Potassium: 4.3 mEq/L (ref 3.7–5.3)
SODIUM: 141 meq/L (ref 137–147)

## 2014-09-04 LAB — CBG MONITORING, ED: GLUCOSE-CAPILLARY: 87 mg/dL (ref 70–99)

## 2014-09-04 LAB — I-STAT CG4 LACTIC ACID, ED: LACTIC ACID, VENOUS: 1.48 mmol/L (ref 0.5–2.2)

## 2014-09-04 MED ORDER — PROCHLORPERAZINE EDISYLATE 5 MG/ML IJ SOLN
10.0000 mg | Freq: Four times a day (QID) | INTRAMUSCULAR | Status: DC | PRN
  Administered 2014-09-05: 10 mg via INTRAVENOUS
  Filled 2014-09-04: qty 2

## 2014-09-04 MED ORDER — DIPHENHYDRAMINE HCL 50 MG/ML IJ SOLN
12.5000 mg | Freq: Once | INTRAMUSCULAR | Status: AC
  Administered 2014-09-04: 12.5 mg via INTRAVENOUS
  Filled 2014-09-04: qty 1

## 2014-09-04 MED ORDER — VALPROATE SODIUM 500 MG/5ML IV SOLN
500.0000 mg | Freq: Once | INTRAVENOUS | Status: AC
  Administered 2014-09-04: 500 mg via INTRAVENOUS
  Filled 2014-09-04: qty 5

## 2014-09-04 MED ORDER — LORAZEPAM 2 MG/ML IJ SOLN
INTRAMUSCULAR | Status: AC
  Administered 2014-09-04: 2 mg
  Filled 2014-09-04: qty 1

## 2014-09-04 MED ORDER — DEXAMETHASONE SODIUM PHOSPHATE 10 MG/ML IJ SOLN
10.0000 mg | Freq: Once | INTRAMUSCULAR | Status: AC
  Administered 2014-09-04: 10 mg via INTRAVENOUS
  Filled 2014-09-04: qty 1

## 2014-09-04 MED ORDER — LORAZEPAM 2 MG/ML IJ SOLN
1.0000 mg | Freq: Once | INTRAMUSCULAR | Status: AC
  Administered 2014-09-05: 1 mg via INTRAVENOUS
  Filled 2014-09-04: qty 1

## 2014-09-04 MED ORDER — LORAZEPAM 1 MG PO TABS: 1.0000 mg | ORAL_TABLET | ORAL | Status: DC | PRN

## 2014-09-04 MED ORDER — ACETAMINOPHEN 500 MG PO TABS
1000.0000 mg | ORAL_TABLET | Freq: Once | ORAL | Status: AC
  Administered 2014-09-04: 1000 mg via ORAL
  Filled 2014-09-04: qty 2

## 2014-09-04 NOTE — ED Notes (Signed)
Dr. Dina Rich into speak with pt. And family and update them on plan of care.  TTS is presently scheduled at 19:30.

## 2014-09-04 NOTE — ED Provider Notes (Signed)
CSN: [id number]     Arrival date & time 09/04/14  1033 History   First MD Initiated Contact with Patient 09/04/14 1110     Chief Complaint  Patient presents with  . Seizures      HPI  Patient presents with evaluation for pseudoseizures.  I received a call approximately 30 minutes for the patient's arrival from her primary care physician, [name] in [place], [place].  He dsecribes a patient exhibiting left arm and left leg contractions in his office, with recent diagnosis of "Pseudoseizures".   Patient has an extensive history. Includes depression, anxiety, daily headaches, migraines, interstitial cystitis.  [facility] Thought to be clinically. Had an episode of "seizure". Went induction. Delivered. Child in NICU for 3 weeks but doing well, and now home. Mother describes "traumatic" experience with her delivery. She had retained products. Fairly it took several attempts at manual extraction. Husband states that she had a great deal of difficulty with the epidural and had "pain in her right side ever since". Since his visit here. Underwent medially EEG here, this hospital. No episodes of abnormal EKG to suggest epileptiform activity.  Presented to her 35 office today with the above symptoms.  Past Medical History  Diagnosis Date  . Migraines   . Interstitial cystitis   . IUD FEB 2010  . Gastritis JULY 2011  . Depression   . 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 Fatty liver   . Obesity (BMI 30-39.9) 2011 228 LBS BMI 36.8  . Iron deficiency anemia 07/23/2010  . Irritable bowel syndrome 2012 DIARRHEA    JUN 2012 TTG IgA 14.9  . Elevated liver enzymes JUL 2011ALK PHOS 111-127 AST  143-267 ALT  213-321T BILI 0.6  ALB  3.7-4.06 Jun 2011 ALK PHOS 118 AST 24 ALT 42 T BILI 0.4 ALB 3.9  . Chronic daily headache   . Ovarian cyst   . PONV (postoperative nausea and vomiting)   . ADD (attention deficit disorder)   . RLQ abdominal  pain 07/18/2013  . Menorrhagia 07/18/2013  . Anxiety   . Potassium (K) deficiency   . Dysmenorrhea 09/18/2013  . Stress 09/18/2013  . Patient desires pregnancy 09/18/2013  . Pregnant 12/25/2013  . Blood transfusion without reported diagnosis   . Heart murmur   . Gastric bypass status for obesity    Past Surgical History  Procedure Laterality Date  . Gab  2007    in [place]-POUCH 5 CM  . Cholecystectomy  2005    biliary dyskinesia  . Cath self every nite      for sodium bicarb injection (discontinued 2013)  . Colonoscopy  JUN 2012 ABD PN/DIARRHEA WITH PROPOFOL    NL COLON  . Upper gastrointestinal endoscopy  JULY 2011 NAUSEA-D125,V6, PH 25    Bx; GASTRITIS, POUCH-5 CM LONG  . Dilation and curettage of uterus    . Tonsillectomy    . Gastric bypass  06/2006  . Savory dilation  06/20/2012    IHK:VQQV gastritis/Ulcer in the mid jejunum  . Tonsillectomy and adenoidectomy    . Esophagogastroduodenoscopy    . Wisdom tooth extraction    . Repair vaginal cuff N/A 07/30/2014    Procedure: REPAIR VAGINAL CUFF;  Surgeon: Mora Bellman, MD;  Location: [facility];  Service: Gynecology;  Laterality: N/A;   Family History  Problem Relation Age of Onset  . Hemochromatosis Maternal Grandmother   . Migraines Maternal  Grandmother   . Cancer Maternal Grandmother   . Hypertension Father   . Diabetes Father   . Coronary artery disease Father   . Migraines Paternal Grandmother   . Cancer Mother     breast  . Hemochromatosis Mother   . Coronary artery disease Paternal Grandfather    History  Substance Use Topics  . Smoking status: Former Smoker -- 5 years    Types: Cigarettes    Quit date: 01/07/2014  . Smokeless tobacco: Never Used  . Alcohol Use: No   OB History   Grav Para Term Preterm Abortions TAB SAB Ect Mult Living   '3 2 1 1 1  1   2     '$ Review of Systems  Unable to perform ROS: Psychiatric disorder      Allergies  Gabapentin; Metoclopramide hcl; Topamax; Codeine; Latex;  and Tape  Home Medications   Prior to Admission medications   Medication Sig Start Date End Date Taking? Authorizing Provider  acetaminophen (TYLENOL) 500 MG tablet Take 1,000 mg by mouth every 6 (six) hours as needed for headache (pain).    Yes Historical Provider, MD  Ascorbic Acid (VITAMIN C) 100 MG CHEW Chew 200 mg by mouth at bedtime.    Yes Historical Provider, MD  BIOTIN PO Take 1 tablet by mouth at bedtime.    Yes Historical Provider, MD  butalbital-acetaminophen-caffeine (FIORICET, ESGIC) 50-325-40 MG per tablet Take 1-2 tablets by mouth every 6 (six) hours as needed for headache.    Yes Historical Provider, MD  Cholecalciferol (VITAMIN D3) 2000 UNITS TABS Take 2,000 Units by mouth at bedtime.    Yes Historical Provider, MD  clonazePAM (KLONOPIN) 1 MG tablet Take 1 tablet (1 mg total) by mouth 2 (two) times daily. As directed, caution drowsiness 09/02/14  Yes Kathyrn Drown, MD  escitalopram (LEXAPRO) 20 MG tablet Take 1 tablet (20 mg total) by mouth daily. 08/30/14  Yes Mikey Kirschner, MD  HYDROcodone-acetaminophen (NORCO/VICODIN) 5-325 MG per tablet Take 1 tablet by mouth every 6 (six) hours as needed (headache). 08/30/14  Yes Jonnie Kind, MD  lidocaine (XYLOCAINE JELLY) 2 % jelly Apply 1 application topically as needed. 08/05/14  Yes Florian Buff, MD  nortriptyline (PAMELOR) 25 MG capsule Take 1 capsule (25 mg total) by mouth at bedtime. 08/30/14  Yes Mikey Kirschner, MD  promethazine (PHENERGAN) 25 MG tablet Take 25 mg by mouth every 6 (six) hours as needed for nausea or vomiting.   Yes Historical Provider, MD  rizatriptan (MAXALT) 10 MG tablet Take 10 mg by mouth as needed for migraine. May repeat in 2 hours if needed   Yes Historical Provider, MD  zolpidem (AMBIEN) 10 MG tablet Take 10 mg by mouth at bedtime as needed for sleep.   Yes Historical Provider, MD   BP 127/81  Pulse 74  Temp(Src) 98.8 F (37.1 C) (Oral)  Resp 24  SpO2 94% Physical Exam  Constitutional: She is  oriented to person, place, and time. She appears well-developed and well-nourished. No distress.  HENT:  Head: Normocephalic.  Eyes: Conjunctivae are normal. Pupils are equal, round, and reactive to light. No scleral icterus.  Neck: Normal range of motion. Neck supple. No thyromegaly present.  Cardiovascular: Normal rate and regular rhythm.  Exam reveals no gallop and no friction rub.   No murmur heard. Pulmonary/Chest: Effort normal and breath sounds normal. No respiratory distress. She has no wheezes. She has no rales.  Abdominal: Soft. Bowel sounds are normal.  She exhibits no distension. There is no tenderness. There is no rebound.  Musculoskeletal: Normal range of motion.  Neurological: She is alert and oriented to person, place, and time.  Frequent episodes of facial twitching, left upper and left lower extremity contractions. These are intermittent and seem of her own volition. She withdraws her right lower extremity her right arm during these episodes. Occasionally talks during these episodes. She has no postictal period.  Skin: Skin is warm and dry. No rash noted.  Psychiatric: She has a normal mood and affect. Her behavior is normal.    ED Course  Procedures (including critical care time) Labs Review Labs Reviewed  CBC WITH DIFFERENTIAL - Abnormal; Notable for the following:    RBC 3.08 (*)    Hemoglobin 9.1 (*)    HCT 27.9 (*)    All other components within normal limits  BASIC METABOLIC PANEL - Abnormal; Notable for the following:    Creatinine, Ser 0.40 (*)    All other components within normal limits  PROLACTIN  CBG MONITORING, ED  I-STAT CG4 LACTIC ACID, ED    Imaging Review Ct Head Wo Contrast  09/03/2014   CLINICAL DATA:  Left-sided migraine. Seizures. Blurred vision. Four weeks postpartum.  EXAM: CT HEAD WITHOUT CONTRAST  TECHNIQUE: Contiguous axial images were obtained from the base of the skull through the vertex without intravenous contrast.  COMPARISON:  Brain  MR of 08/14/2014.  CT of the head of 08/13/2014.  FINDINGS: Sinuses/Soft tissues: Clear paranasal sinuses and mastoid air cells.  Intracranial: Mild motion degradation. These portions were repeated.  No mass lesion, hemorrhage, hydrocephalus, acute infarct, intra-axial, or extra-axial fluid collection.  IMPRESSION: Normal head CT.   Electronically Signed   By: Abigail Miyamoto M.D.   On: 09/03/2014 18:05   Ct Abdomen Pelvis W Contrast  09/03/2014   CLINICAL DATA:  Left-sided migraine headache with blurred vision, nausea, right lower quadrant pain and fever. History of gastric bypass in 2007. Patient is 4 weeks postpartum.  EXAM: CT ABDOMEN AND PELVIS WITH CONTRAST  TECHNIQUE: Multidetector CT imaging of the abdomen and pelvis was performed using the standard protocol following bolus administration of intravenous contrast.  CONTRAST:  54mL OMNIPAQUE IOHEXOL 300 MG/ML SOLN, 132mL OMNIPAQUE IOHEXOL 300 MG/ML SOLN  COMPARISON:  Lumbar MRI 07/30/2014.  Abdominal pelvic CT 11/13/2012.  FINDINGS: Lung bases: Minimal bibasilar atelectasis. No pleural or pericardial effusion.  Liver/Biliary/Pancreas: The liver is normal in density without focal abnormality. There is stable mild prominence of the extrahepatic biliary system status post cholecystectomy. The pancreas appears normal.  Spleen/Adrenal glands: Unremarkable.  Kidneys/Ureters/Bladder: Both kidneys appear normal. There is no hydronephrosis or evidence of urinary tract calculus. The bladder appears normal.  Bowel/Peritoneum: There are stable postsurgical changes status post gastric bypass. There is no small bowel distension or wall thickening. There is prominent stool throughout the colon. The appendix is probably visualized in the central abdomen, best seen on coronal image 41. No pelvic inflammatory changes are seen. No ascites or peritoneal nodularity.  Retroperitoneum/Pelvis: There are no enlarged abdominal or pelvic lymph nodes. There are mildly prominent  mesenteric lymph nodes which are probably reactive. No significant vascular findings are present. The postpartum uterus has decreased in size compared with the prior lumbar MRI. The endometrial cavity remains distended with heterogeneous material, some of which demonstrates increased density. There is no adnexal mass.  Abdominal wall: No abdominal wall masses or hernias.  Musculoskeletal: No acute or significant osseus findings.  IMPRESSION: 1. Prominent stool throughout  the colon suggesting constipation. No evidence of bowel obstruction or appendicitis. 2. There is high-density material within the distended endometrial cavity status post recent childbirth. Findings could be secondary to retained products of conception. Correlation with beta HCG levels and consideration of follow-up pelvic ultrasound suggested.   Electronically Signed   By: Camie Patience M.D.   On: 09/03/2014 18:14     EKG Interpretation None      MDM   Final diagnoses:  Pseudoseizure    Pt with recurrent episodes of headache, LUE and LLE contractions, facial grimacing, but immediately conversant after.  Extensive recent inpatient eval at [facility], [facility] and Video EEG during episodes showing non-epileptiform. Discharged with diagnosis of pseudoseizure.  Continues to have these episodes now. I've asked for TTS for psychiatric evaluation. Hopefully she will be cooperative and interactive during the. She obviously needs psychiatric evaluation and treatment these episodes resolved. I am in agreement that antiepileptics are not indicated. She has been given benzodiazepines. I have treated her headache with Depakote, Decadron, and Tylenol.  Mother, and the patient's husband both state that they were difficult with her at home during these episodes, reassured that they are not seizures, if more of a psychiatric plan of care were in place after psychiatric evaluation. This is quite appropriate.  She is awaiting TTS  evaluation now.  I discussed her case at length with her mother. I think is her consistent with pseudoseizures/conversion reaction and likely 2/2 postpartum depression.    Tanna Furry, MD 09/04/14 [phone]

## 2014-09-04 NOTE — Telephone Encounter (Signed)
Given patient's current complex neurologic/pyschogenic issues it would be inappropriate for any prescriptions not directly addressing a specific GYN issue to be provided by our office.  I informed the patient and her husband of this at their last appointment they had with me.  They understood and were in agreement

## 2014-09-04 NOTE — BH Assessment (Signed)
Spoke with EDP Dr. Dina Rich to obtain clinicals prior to assessing patient. Pt presents with Pseudoseizure activity and Post Partum depression.   Shaune Pollack, MS, [place] Assessment Counselor

## 2014-09-04 NOTE — Progress Notes (Signed)
Patient ID: Penny [name], female   DOB: June 08, 1986, 28 y.o.   MRN: [id number] Blood pressure 130/100, weight 218 lb (98.884 kg), not currently breastfeeding. 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

## 2014-09-04 NOTE — ED Notes (Signed)
Pt. Is sleeping , updated on plan of care. Pt. s mother verbalized understanding

## 2014-09-04 NOTE — ED Notes (Signed)
Attempting to contact [facility],  To verify TTS, Unable to contact them.  [place], [place] and [place], Agricultural consultant also attempting to notify [facility]. Updated pt. And pt.s family on plan of care and apologized for delay.

## 2014-09-04 NOTE — Progress Notes (Signed)
   Subjective:    Patient ID: Penny [name], female    DOB: Mar 26, 1986, 28 y.o.   MRN: [id number]  HPI This patient presents to our office today. She is here today for further discussion and evaluation of her psychiatric illness as well as her migraines/pseudoseizures. When she arrived at her office they could not get her out of the vehicle because of her pseudoseizure-like activity. I went out to the vehicle to see her. She was not cyanotic. She was unresponsive except for mumbling. She was in more of an extended position with her arms in the wrists were bent hand which were closed. There was no jerking movements.  The family states that she is having as many of them as 10-15 of these episodes per day sometimes a few minutes in length sometimes several hours in length. Also having intractable migraines with severe headache.  This patient was recently admitted to [facility] then transferred to [facility] she had EEG monitoring and during one of her spells the EEG monitoring did not show any epileptic forms. She was diagnosed with pseudoseizures. She was released from the hospital and instructed to followup with neurology and also see psychiatry. Both of these appointments are significant distance away.  The family states that she has been dealing with postpartum depression ever since having her child several weeks back. They have stated that because of the frequency of these events they cannot trust her with caring for her child. They also states that she has had several times where she is out of bed with these spells.   Review of Systems     Objective:   Physical Exam  Pulse normal respiratory rate normal initial part of evaluation patient not responsive latter part she was able to talk very slowly. Tearful.      Assessment & Plan:  #1 pseudoseizures-unfortunately there is not much one can do for this from a medical standpoint. The treatment of choice is intensive psychiatric help.  According to the family in the patient has stated that she did not feel "crazy "but I explained to the family that intensive psychiatric care is the best treatment for this. Her psychiatric appointment is with a counselor or not a psychiatrist. This is October 15. Unfortunately the patient has Medicaid and is having a difficult time getting care before that time. Also accessibility to a psychiatrist is very limited with this under funded system. I am very sympathetic to what is going on with this family. She needs help sooner.   With her current condition I could not bring her in to our office and recommended that she be brought immediately to [facility] ER. I spoke with the ER doctor and recommended behavioral health team consult on this patient today. This patient needs urgent psychiatric care. It seems that the delay with being able to be treated is causing this person to have ongoing issues and has presented at the emergency department multiple times since then. Hopefully the behavioral health team could consider admission for treatment or intensive outpatient therapy and evaluation that would start very soon. I am available to speak with any of the providers or behavioral health assessment team.

## 2014-09-04 NOTE — ED Notes (Signed)
Pt.s family updated with plan of care by Dr. Jeneen Rinks.  Family verbalized understanding

## 2014-09-04 NOTE — ED Notes (Signed)
Penny [name], Case management speaking with pt.s family in reference to Neurology appointment.

## 2014-09-04 NOTE — BH Assessment (Signed)
Tele Assessment Note   Penny [name] is a 28 y.o. female who voluntarily presents to [facility] with severe migraines and postpartum depression.  Pt says she delivered on 07/29/14 and says her depression has increased and the frequency of her migraines has increased too.  Pt says she is frustrated because she has been out work since her 7th week of pregnancy, she developed preeclampsia and her daughter was born 63 wks early.  Pt is crying during interview and says the migraines and the depression are debilitating and she is not able to take care of her children(newborn and 82yr old).  Pt says she has a supportive family--mother, spouse, father and friend take care of her children and the home.  Pt says she is stressed because her father and mother divorced 1.5 yrs ago.  Pt says she has developed seizures that sometimes last for 2 hrs and she is unable to function in the home.  Pt says she wants to come back to work and return to a normal life.  Pt denies SI/HI/AVH and says she is safe to return home, but she can't live with the pain anymore.  Pt confirmed that she has an appt with a psychiatrist/therapist at the end of October, but can't wait that long.  She wants a referral to a psych that can see her within 48hrs.  This Probation officer offered services with [facility] [place] outpatient, but explained that no guarantee can be given on time frame of appt and that most area psych's have a lengthy wait list.  Pt and mother requested to speak with a psych in the AM for medication recommendations. This Probation officer informed that psychiatrist may not provide psych meds because pt is not receiving outpatient services with them.     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 Migraines   . Interstitial cystitis   . IUD FEB 2010  . Gastritis JULY 2011  . Depression   . 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 Fatty liver   .  Obesity (BMI 30-39.9) 2011 228 LBS BMI 36.8  . Iron deficiency anemia 07/23/2010  . Irritable bowel syndrome 2012 DIARRHEA    JUN 2012 TTG IgA 14.9  . Elevated liver enzymes JUL 2011ALK PHOS 111-127 AST  143-267 ALT  213-321T BILI 0.6  ALB  3.7-4.06 Jun 2011 ALK PHOS 118 AST 24 ALT 42 T BILI 0.4 ALB 3.9  . Chronic daily headache   . Ovarian cyst   . PONV (postoperative nausea and vomiting)   . ADD (attention deficit disorder)   . RLQ abdominal pain 07/18/2013  . Menorrhagia 07/18/2013  . Anxiety   . Potassium (K) deficiency   . Dysmenorrhea 09/18/2013  . Stress 09/18/2013  . Patient desires pregnancy 09/18/2013  . Pregnant 12/25/2013  . Blood transfusion without reported diagnosis   . Heart murmur   . Gastric bypass status for obesity    Axis IV: other psychosocial or environmental problems and problems related to social environment Axis V: 41-50 serious symptoms  Past Medical History:  Past Medical History  Diagnosis Date  . Migraines   . Interstitial cystitis   . IUD FEB 2010  . Gastritis JULY 2011  . Depression   . 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 Fatty liver   . Obesity (BMI 30-39.9) 2011 228 LBS BMI  36.8  . Iron deficiency anemia 07/23/2010  . Irritable bowel syndrome 2012 DIARRHEA    JUN 2012 TTG IgA 14.9  . Elevated liver enzymes JUL 2011ALK PHOS 111-127 AST  143-267 ALT  213-321T BILI 0.6  ALB  3.7-4.06 Jun 2011 ALK PHOS 118 AST 24 ALT 42 T BILI 0.4 ALB 3.9  . Chronic daily headache   . Ovarian cyst   . PONV (postoperative nausea and vomiting)   . ADD (attention deficit disorder)   . RLQ abdominal pain 07/18/2013  . Menorrhagia 07/18/2013  . Anxiety   . Potassium (K) deficiency   . Dysmenorrhea 09/18/2013  . Stress 09/18/2013  . Patient desires pregnancy 09/18/2013  . Pregnant 12/25/2013  . Blood transfusion without reported diagnosis   . Heart murmur   . Gastric bypass status for obesity     Past Surgical History  Procedure Laterality Date  .  Gab  2007    in [place]-POUCH 5 CM  . Cholecystectomy  2005    biliary dyskinesia  . Cath self every nite      for sodium bicarb injection (discontinued 2013)  . Colonoscopy  JUN 2012 ABD PN/DIARRHEA WITH PROPOFOL    NL COLON  . Upper gastrointestinal endoscopy  JULY 2011 NAUSEA-D125,V6, PH 25    Bx; GASTRITIS, POUCH-5 CM LONG  . Dilation and curettage of uterus    . Tonsillectomy    . Gastric bypass  06/2006  . Savory dilation  06/20/2012    KJZ:PHXT gastritis/Ulcer in the mid jejunum  . Tonsillectomy and adenoidectomy    . Esophagogastroduodenoscopy    . Wisdom tooth extraction    . Repair vaginal cuff N/A 07/30/2014    Procedure: REPAIR VAGINAL CUFF;  Surgeon: Mora Bellman, MD;  Location: [facility];  Service: Gynecology;  Laterality: N/A;    Family History:  Family History  Problem Relation Age of Onset  . Hemochromatosis Maternal Grandmother   . Migraines Maternal Grandmother   . Cancer Maternal Grandmother   . Hypertension Father   . Diabetes Father   . Coronary artery disease Father   . Migraines Paternal Grandmother   . Cancer Mother     breast  . Hemochromatosis Mother   . Coronary artery disease Paternal Grandfather     Social History:  reports that she quit smoking about 7 months ago. Her smoking use included Cigarettes. She smoked 0.00 packs per day for 5 years. She has never used smokeless tobacco. She reports that she does not drink alcohol or use illicit drugs.  Additional Social History:  Alcohol / Drug Use Pain Medications: See MAR  Prescriptions: See MAR  Over the Counter: See MAR  History of alcohol / drug use?: No history of alcohol / drug abuse Longest period of sobriety (when/how long): None   CIWA: CIWA-Ar BP: 128/75 mmHg Pulse Rate: 99 COWS:    PATIENT STRENGTHS: (choose at least two) Ability for insight Communication skills  Allergies:  Allergies  Allergen Reactions  . Gabapentin Other (See Comments)    Pt states that she was  unresponsive after taking this medication, but her vitals remained stable.    . Metoclopramide Hcl Anxiety and Other (See Comments)    Pt states that she felt like she was trapped in a box, and could not get out.  Pt also states that she had temporary loss of movement, weakness, and tingling.    . Topamax [Topiramate] Shortness Of Breath and Other (See Comments)  Adverse side effects.  Pt states this medication changed her taste for a short period of time and caused numbness and tingling in extremities.    . Codeine Nausea And Vomiting  . Latex Rash  . Tape Itching and Rash    Home Medications:  (Not in a hospital admission)  OB/GYN Status:  No LMP recorded. Patient is not currently having periods (Reason: Other).  General Assessment Data Location of Assessment: [facility] ED Is this a Tele or Face-to-Face Assessment?: Tele Assessment Is this an Initial Assessment or a Re-assessment for this encounter?: Initial Assessment Living Arrangements: Children;Spouse/significant other (Spouse, Children(newborn &71yr old) in the home ) Can pt return to current living arrangement?: Yes Admission Status: Voluntary Is patient capable of signing voluntary admission?: Yes Transfer from: Home Referral Source: Self/Family/Friend  Medical Screening Exam ([place]) Medical Exam completed: No Reason for MSE not completed: Other: (None )  [place] Living Arrangements: Children;Spouse/significant other (Spouse, Children(newborn &36yr old) in the home ) Name of Psychiatrist: None  Name of Therapist: None   Education Status Is patient currently in school?: No Current Grade: None  Highest grade of school patient has completed: None  Name of school: None  Contact person: None   Risk to self with the past 6 months Suicidal Ideation: No Suicidal Intent: No Is patient at risk for suicide?: No Suicidal Plan?: No Access to Means: No What has been your use of drugs/alcohol within the last  12 months?: Pt denies Previous Attempts/Gestures: No How many times?: 0 Other Self Harm Risks: None  Triggers for Past Attempts: None known Intentional Self Injurious Behavior: None Family Suicide History: No Recent stressful life event(s): Other (Comment);Loss (Comment) (Parents divorce; newborn born on 07/29/14; postpartum depres) Persecutory voices/beliefs?: No Depression: Yes Depression Symptoms: Tearfulness;Fatigue;Guilt;Loss of interest in usual pleasures;Feeling worthless/self pity Substance abuse history and/or treatment for substance abuse?: No Suicide prevention information given to non-admitted patients: Not applicable  Risk to Others within the past 6 months Homicidal Ideation: No Thoughts of Harm to Others: No Current Homicidal Intent: No Current Homicidal Plan: No Access to Homicidal Means: No Identified Victim: None  History of harm to others?: No Assessment of Violence: None Noted Violent Behavior Description: None  Does patient have access to weapons?: No Criminal Charges Pending?: No Does patient have a court date: No  Psychosis Hallucinations: None noted Delusions: None noted  Mental Status Report Appear/Hygiene: In hospital gown Eye Contact: Good Motor Activity: Unremarkable Speech: Logical/coherent;Soft Level of Consciousness: Alert Affect: Flat;Depressed;Sad Anxiety Level: None Thought Processes: Coherent;Relevant Judgement: Impaired Orientation: Person;Place;Time;Situation Obsessive Compulsive Thoughts/Behaviors: None  Cognitive Functioning Concentration: Decreased Memory: Recent Intact;Remote Intact IQ: Average Insight: Fair Impulse Control: Fair Appetite: Good Weight Loss: 0 Weight Gain: 0 Sleep: Increased Total Hours of Sleep: 12 Vegetative Symptoms: None  ADLScreening [facility] Assessment [facility]) Patient's cognitive ability adequate to safely complete daily activities?: Yes Patient able to express need for assistance with ADLs?:  Yes Independently performs ADLs?: Yes (appropriate for developmental age)  Prior Inpatient Therapy Prior Inpatient Therapy: Yes Prior Therapy Dates: 2013 Prior Therapy Facilty/Provider(s): [facility]  Reason for Treatment: Depression/SI  Prior Outpatient Therapy Prior Outpatient Therapy: No Prior Therapy Dates: None  Prior Therapy Facilty/Provider(s): None  Reason for Treatment: None   ADL Screening (condition at time of admission) Patient's cognitive ability adequate to safely complete daily activities?: Yes Is the patient deaf or have difficulty hearing?: No Does the patient have difficulty seeing, even when wearing glasses/contacts?: No Does the patient have  difficulty concentrating, remembering, or making decisions?: Yes Patient able to express need for assistance with ADLs?: Yes Does the patient have difficulty dressing or bathing?: No Independently performs ADLs?: Yes (appropriate for developmental age) Does the patient have difficulty walking or climbing stairs?: No Weakness of Legs: None Weakness of Arms/Hands: None  Home Assistive Devices/Equipment Home Assistive Devices/Equipment: Eyeglasses  Therapy Consults (therapy consults require a physician order) PT Evaluation Needed: No OT Evalulation Needed: No SLP Evaluation Needed: No Abuse/Neglect Assessment (Assessment to be complete while patient is alone) Physical Abuse: Denies Verbal Abuse: Denies Sexual Abuse: Denies Exploitation of patient/patient's resources: Denies Self-Neglect: Denies Values / Beliefs Cultural Requests During Hospitalization: None Spiritual Requests During Hospitalization: None Consults Spiritual Care Consult Needed: No Social Work Consult Needed: No Regulatory affairs officer (For Healthcare) Does patient have an advance directive?: No Would patient like information on creating an advanced directive?: No - patient declined information Nutrition Screen- [facility] Adult/WL/AP Patient's home diet:  Regular  Additional Information 1:1 In Past 12 Months?: No CIRT Risk: No Elopement Risk: No Does patient have medical clearance?: Yes     Disposition:  Disposition Initial Assessment Completed for this Encounter: Yes Disposition of Patient: Other dispositions Other disposition(s): Other (Comment) (Pls see EPIC note )  [place] Rm 09/04/2014 10:09 PM

## 2014-09-04 NOTE — ED Notes (Signed)
Behavioral Health reported that pt. Will have TTS at 16:00.  Dance movement psychotherapist spoke with [facility].

## 2014-09-04 NOTE — Telephone Encounter (Signed)
Pharmacy informed I will NOT be filling any such rx's for patient

## 2014-09-04 NOTE — Telephone Encounter (Signed)
I spoke with assistant with Dr Harrington Challenger. Probably the soonest they can see her is Oct 29th. This pt needs help before then . Hopefully eval in ER will result with inpatient care and intensive psychiatric care. If deemed as out pt then pt will need psych care soon to avoid frequent ER visits and to help the pt sooner

## 2014-09-05 DIAGNOSIS — Z862 Personal history of diseases of the blood and blood-forming organs and certain disorders involving the immune mechanism: Secondary | ICD-10-CM | POA: Diagnosis not present

## 2014-09-05 DIAGNOSIS — Z8719 Personal history of other diseases of the digestive system: Secondary | ICD-10-CM | POA: Diagnosis not present

## 2014-09-05 DIAGNOSIS — Z87891 Personal history of nicotine dependence: Secondary | ICD-10-CM | POA: Diagnosis not present

## 2014-09-05 DIAGNOSIS — Z9884 Bariatric surgery status: Secondary | ICD-10-CM | POA: Diagnosis not present

## 2014-09-05 DIAGNOSIS — Z87448 Personal history of other diseases of urinary system: Secondary | ICD-10-CM | POA: Diagnosis not present

## 2014-09-05 DIAGNOSIS — Z79899 Other long term (current) drug therapy: Secondary | ICD-10-CM | POA: Diagnosis not present

## 2014-09-05 DIAGNOSIS — Z8759 Personal history of other complications of pregnancy, childbirth and the puerperium: Secondary | ICD-10-CM | POA: Diagnosis not present

## 2014-09-05 DIAGNOSIS — R569 Unspecified convulsions: Secondary | ICD-10-CM | POA: Diagnosis present

## 2014-09-05 DIAGNOSIS — F53 Puerperal psychosis: Secondary | ICD-10-CM

## 2014-09-05 DIAGNOSIS — Z975 Presence of (intrauterine) contraceptive device: Secondary | ICD-10-CM | POA: Diagnosis not present

## 2014-09-05 DIAGNOSIS — F419 Anxiety disorder, unspecified: Secondary | ICD-10-CM | POA: Diagnosis not present

## 2014-09-05 DIAGNOSIS — Z91040 Latex allergy status: Secondary | ICD-10-CM | POA: Diagnosis not present

## 2014-09-05 DIAGNOSIS — R011 Cardiac murmur, unspecified: Secondary | ICD-10-CM | POA: Diagnosis not present

## 2014-09-05 DIAGNOSIS — F411 Generalized anxiety disorder: Secondary | ICD-10-CM

## 2014-09-05 DIAGNOSIS — G43909 Migraine, unspecified, not intractable, without status migrainosus: Secondary | ICD-10-CM | POA: Diagnosis not present

## 2014-09-05 MED ORDER — ONDANSETRON HCL 4 MG/2ML IJ SOLN
4.0000 mg | Freq: Once | INTRAMUSCULAR | Status: AC
  Administered 2014-09-05: 4 mg via INTRAVENOUS
  Filled 2014-09-05: qty 2

## 2014-09-05 MED ORDER — PAROXETINE HCL ER 25 MG PO TB24: 25.0000 mg | ORAL_TABLET | Freq: Every day | ORAL | Status: DC

## 2014-09-05 MED ORDER — MORPHINE SULFATE 4 MG/ML IJ SOLN
4.0000 mg | Freq: Once | INTRAMUSCULAR | Status: AC
  Administered 2014-09-05: 4 mg via INTRAVENOUS
  Filled 2014-09-05: qty 1

## 2014-09-05 MED ORDER — NORTRIPTYLINE HCL 50 MG PO CAPS: 50.0000 mg | ORAL_CAPSULE | Freq: Two times a day (BID) | ORAL | Status: DC

## 2014-09-05 NOTE — ED Notes (Signed)
Pt presents to ED yesterday with c/o post partum depression, delivered on 07/29/14 and has severe migraines and postpartum depression.  Depression has worsened and frequency of migraines has increased as well.  Pt is frustrated because she has been out of work since 7th week of pregnancy and developed preeclampsia and her daughter was born 52 wks early.  C/o seizures that last for 2 hours sometimes.  Psychiatrist to eval today.  Spouse at bedside.

## 2014-09-05 NOTE — ED Notes (Signed)
[facility] to fax outpatient resource guide in the event pt decides to leave

## 2014-09-05 NOTE — Consult Note (Signed)
[facility] Face-to-Face Psychiatry Consult   Reason for Consult:  Post partum depression, migraines and pseudoseizures Referring Physician:  EDP  Penny [name] is an 28 y.o. female. Total Time spent with patient: 1 hour  Assessment: AXIS I:  Generalized Anxiety Disorder, Major Depression, Recurrent severe and post partum depress AXIS II:  Deferred AXIS III:   Past Medical History  Diagnosis Date  . Migraines   . Interstitial cystitis   . IUD FEB 2010  . Gastritis JULY 2011  . Depression   . 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 Fatty liver   . Obesity (BMI 30-39.9) 2011 228 LBS BMI 36.8  . Iron deficiency anemia 07/23/2010  . Irritable bowel syndrome 2012 DIARRHEA    JUN 2012 TTG IgA 14.9  . Elevated liver enzymes JUL 2011ALK PHOS 111-127 AST  143-267 ALT  213-321T BILI 0.6  ALB  3.7-4.06 Jun 2011 ALK PHOS 118 AST 24 ALT 42 T BILI 0.4 ALB 3.9  . Chronic daily headache   . Ovarian cyst   . PONV (postoperative nausea and vomiting)   . ADD (attention deficit disorder)   . RLQ abdominal pain 07/18/2013  . Menorrhagia 07/18/2013  . Anxiety   . Potassium (K) deficiency   . Dysmenorrhea 09/18/2013  . Stress 09/18/2013  . Patient desires pregnancy 09/18/2013  . Pregnant 12/25/2013  . Blood transfusion without reported diagnosis   . Heart murmur   . Gastric bypass status for obesity    AXIS IV:  economic problems, housing problems, occupational problems, other psychosocial or environmental problems, problems related to social environment and problems with primary support group AXIS V:  51-60 moderate symptoms  Plan:  Change Lexapro to Paxil CR 25 mg PO QD and increase Nortriptyline 50 mg BID and continue Klonopin 1 mg PO BID Follow up with [facility],  during next few days as scheduled by our case manager from [facility] No evidence of imminent risk to self or others at present.   Patient does not meet criteria for psychiatric inpatient  admission. Supportive therapy provided about ongoing stressors. Discussed crisis plan, support from social network, calling 911, coming to the Emergency Department, and calling Suicide Hotline. Appreciate psychiatric consultation Please contact [phone] if needs further assistance  Subjective:   Penny [name] is a 28 y.o. female patient admitted with migraines,Post partum depression and pseudoseizures.  HPI:  Penny [name] is a 28 y.o. female seen face to face for psychiatric consultation as she has been suffering with significant and disabled due to increased depression, anxiety, migraine headaches, pseudoseizures for the last four weeks and she is also has post partum period. She was unable to care for her self and her children. Her family is not able to go to work for the same period because of her illness. She was reportedly admitted to [facility] and [facility] and had complete neurological work and ruled out epileptic form seizures for more than once. Patient primary care physician providing medication for some time states that she needs psychiatric care and she does no have access to mental health until middle of October etc. She endorses more anxiety, frustration and being upset with her clinical condition. 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 She has post partum depression five years ago after first pregnancy and reportedly she has complicated second pregnancy and delivery and has follow up appointment tomorrow with Harle Battiest. She denied suicide/homicide ideation,  intention or plans. She has no evidence of psychosis. She has very supportive family, husband and dad. Patient parents separated about a year ago which she took it hard. Family history significant for depression and anxiety.   For more details review the following notes. She is admitted voluntarily presents to [facility] with severe migraines and postpartum depression. Pt says she delivered on 07/29/14 and says her depression  has increased and the frequency of her migraines has increased too. Pt says she is frustrated because she has been out work since her 7th week of pregnancy, she developed preeclampsia and her daughter was born 52 wks early. Pt is crying during interview and says the migraines and the depression are debilitating and she is not able to take care of her children(newborn and 22yr old). Pt says she has a supportive family--mother, spouse, father and friend take care of her children and the home. Pt says she is stressed because her father and mother divorced 1.5 yrs ago. Pt says she has developed seizures that sometimes last for 2 hrs and she is unable to function in the home. Pt says she wants to come back to work and return to a normal life. Pt denies SI/HI/AVH and says she is safe to return home, but she can't live with the pain anymore. Pt confirmed that she has an appt with a psychiatrist/therapist at the end of October, but can't wait that long. She wants a referral to a psych that can see her within 48hrs. This Probation officer offered services with [facility] outpatient, but explained that no guarantee can be given on time frame of appt and that most area psych's have a lengthy wait list. Pt and mother requested to speak with a psych in the AM for medication recommendations. This Probation officer informed that psychiatrist may not provide psych meds because pt is not receiving outpatient services with them.   HPI Elements:   Location:  depression, anxiety, pseudoseizures, and headaches. Quality:  unable to function at home without help. Severity:  moderate to severe. Timing:  post partum period. Duration:  four weeks. Context:  multiple psychosocial and family issues.  Past Psychiatric History: Past Medical History  Diagnosis Date  . Migraines   . Interstitial cystitis   . IUD FEB 2010  . Gastritis JULY 2011  . Depression   . 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 Fatty liver   . Obesity (BMI 30-39.9) 2011 228 LBS BMI 36.8   . Iron deficiency anemia 07/23/2010  . Irritable bowel syndrome 2012 DIARRHEA    JUN 2012 TTG IgA 14.9  . Elevated liver enzymes JUL 2011ALK PHOS 111-127 AST  143-267 ALT  213-321T BILI 0.6  ALB  3.7-4.06 Jun 2011 ALK PHOS 118 AST 24 ALT 42 T BILI 0.4 ALB 3.9  . Chronic daily headache   . Ovarian cyst   . PONV (postoperative nausea and vomiting)   . ADD (attention deficit disorder)   . RLQ abdominal pain 07/18/2013  . Menorrhagia 07/18/2013  . Anxiety   . Potassium (K) deficiency   . Dysmenorrhea 09/18/2013  . Stress 09/18/2013  . Patient desires pregnancy 09/18/2013  . Pregnant 12/25/2013  . Blood transfusion without reported diagnosis   . Heart murmur   . Gastric bypass status for obesity     reports that she quit smoking about 7 months ago. Her smoking use included Cigarettes. She smoked 0.00 packs per day for 5 years. She has never used smokeless  tobacco. She reports that she does not drink alcohol or use illicit drugs. Family History  Problem Relation Age of Onset  . Hemochromatosis Maternal Grandmother   . Migraines Maternal Grandmother   . Cancer Maternal Grandmother   . Hypertension Father   . Diabetes Father   . Coronary artery disease Father   . Migraines Paternal Grandmother   . Cancer Mother     breast  . Hemochromatosis Mother   . Coronary artery disease Paternal Grandfather    Family History Substance Abuse: No Family Supports: Yes, List: (Spouse, Mother, Father, Friend) Living Arrangements: Children;Spouse/significant other (Spouse, Children(newborn &84yr old) in the home ) Can pt return to current living arrangement?: Yes Abuse/Neglect [facility]) Physical Abuse: Denies Verbal Abuse: Denies Sexual Abuse: Denies Allergies:   Allergies  Allergen Reactions  . Gabapentin Other (See Comments)    Pt states that she was unresponsive after taking this medication, but her vitals remained stable.    . Metoclopramide Hcl Anxiety and Other (See Comments)    Pt states  that she felt like she was trapped in a box, and could not get out.  Pt also states that she had temporary loss of movement, weakness, and tingling.    . Topamax [Topiramate] Shortness Of Breath and Other (See Comments)    Adverse side effects.  Pt states this medication changed her taste for a short period of time and caused numbness and tingling in extremities.    . Codeine Nausea And Vomiting  . Latex Rash  . Tape Itching and Rash    ACT Assessment Complete:  Yes:    Educational Status    Risk to Self: Risk to self with the past 6 months Suicidal Ideation: No Suicidal Intent: No Is patient at risk for suicide?: No Suicidal Plan?: No Access to Means: No What has been your use of drugs/alcohol within the last 12 months?: Pt denies Previous Attempts/Gestures: No How many times?: 0 Other Self Harm Risks: None  Triggers for Past Attempts: None known Intentional Self Injurious Behavior: None Family Suicide History: No Recent stressful life event(s): Other (Comment);Loss (Comment) (Parents divorce; newborn born on 07/29/14; postpartum depres) Persecutory voices/beliefs?: No Depression: Yes Depression Symptoms: Tearfulness;Fatigue;Guilt;Loss of interest in usual pleasures;Feeling worthless/self pity Substance abuse history and/or treatment for substance abuse?: No Suicide prevention information given to non-admitted patients: Not applicable  Risk to Others: Risk to Others within the past 6 months Homicidal Ideation: No Thoughts of Harm to Others: No Current Homicidal Intent: No Current Homicidal Plan: No Access to Homicidal Means: No Identified Victim: None  History of harm to others?: No Assessment of Violence: None Noted Violent Behavior Description: None  Does patient have access to weapons?: No Criminal Charges Pending?: No Does patient have a court date: No  Abuse: Abuse/Neglect Assessment (Assessment to be complete while patient is alone) Physical Abuse: Denies Verbal  Abuse: Denies Sexual Abuse: Denies Exploitation of patient/patient's resources: Denies Self-Neglect: Denies  Prior Inpatient Therapy: Prior Inpatient Therapy Prior Inpatient Therapy: Yes Prior Therapy Dates: 2013 Prior Therapy Facilty/Provider(s): [facility]  Reason for Treatment: Depression/SI  Prior Outpatient Therapy: Prior Outpatient Therapy Prior Outpatient Therapy: No Prior Therapy Dates: None  Prior Therapy Facilty/Provider(s): None  Reason for Treatment: None   Additional Information: Additional Information 1:1 In Past 12 Months?: No CIRT Risk: No Elopement Risk: No Does patient have medical clearance?: Yes    Objective: Blood pressure 115/74, pulse 73, temperature 98.8 F (37.1 C), temperature source Oral, resp. rate 16, SpO2 96.00%, not currently  breastfeeding.There is no weight on file to calculate BMI. Results for orders placed during the hospital encounter of 09/04/14 (from the past 72 hour(s))  CBG MONITORING, ED     Status: None   Collection Time    09/04/14 10:40 AM      Result Value Ref Range   Glucose-Capillary 87  70 - 99 mg/dL  CBC WITH DIFFERENTIAL     Status: Abnormal   Collection Time    09/04/14 12:36 PM      Result Value Ref Range   WBC 7.4  4.0 - 10.5 K/uL   RBC 3.08 (*) 3.87 - 5.11 MIL/uL   Hemoglobin 9.1 (*) 12.0 - 15.0 g/dL   HCT 27.9 (*) 36.0 - 46.0 %   MCV 90.6  78.0 - 100.0 fL   MCH 29.5  26.0 - 34.0 pg   MCHC 32.6  30.0 - 36.0 g/dL   RDW 14.0  11.5 - 15.5 %   Platelets 227  150 - 400 K/uL   Neutrophils Relative % 64  43 - 77 %   Neutro Abs 4.8  1.7 - 7.7 K/uL   Lymphocytes Relative 25  12 - 46 %   Lymphs Abs 1.9  0.7 - 4.0 K/uL   Monocytes Relative 11  3 - 12 %   Monocytes Absolute 0.8  0.1 - 1.0 K/uL   Eosinophils Relative 0  0 - 5 %   Eosinophils Absolute 0.0  0.0 - 0.7 K/uL   Basophils Relative 0  0 - 1 %   Basophils Absolute 0.0  0.0 - 0.1 K/uL  BASIC METABOLIC PANEL     Status: Abnormal   Collection Time    09/04/14 12:36 PM       Result Value Ref Range   Sodium 141  137 - 147 mEq/L   Potassium 4.3  3.7 - 5.3 mEq/L   Chloride 104  96 - 112 mEq/L   CO2 24  19 - 32 mEq/L   Glucose, Bld 88  70 - 99 mg/dL   BUN 8  6 - 23 mg/dL   Creatinine, Ser 0.40 (*) 0.50 - 1.10 mg/dL   Calcium 8.6  8.4 - 10.5 mg/dL   GFR calc non Af Amer >90  >90 mL/min   GFR calc Af Amer >90  >90 mL/min   Comment: (NOTE)     The eGFR has been calculated using the CKD EPI equation.     This calculation has not been validated in all clinical situations.     eGFR's persistently <90 mL/min signify possible Chronic Kidney     Disease.   Anion gap 13  5 - 15  I-STAT CG4 LACTIC ACID, ED     Status: None   Collection Time    09/04/14  1:00 PM      Result Value Ref Range   Lactic Acid, Venous 1.48  0.5 - 2.2 mmol/L   Labs are reviewed .  Current Facility-Administered Medications  Medication Dose Route Frequency Provider Last Rate Last Dose  . LORazepam (ATIVAN) tablet 1 mg  1 mg Oral Q30 min PRN Tanna Furry, MD      . prochlorperazine (COMPAZINE) injection 10 mg  10 mg Intravenous Q6H PRN Merryl Hacker, MD   10 mg at 09/05/14 0036   Current Outpatient Prescriptions  Medication Sig Dispense Refill  . acetaminophen (TYLENOL) 500 MG tablet Take 1,000 mg by mouth every 6 (six) hours as needed for headache (pain).       Marland Kitchen  Ascorbic Acid (VITAMIN C) 100 MG CHEW Chew 200 mg by mouth at bedtime.       Marland Kitchen BIOTIN PO Take 1 tablet by mouth at bedtime.       . butalbital-acetaminophen-caffeine (FIORICET, ESGIC) 50-325-40 MG per tablet Take 1-2 tablets by mouth every 6 (six) hours as needed for headache.       . Cholecalciferol (VITAMIN D3) 2000 UNITS TABS Take 2,000 Units by mouth at bedtime.       . clonazePAM (KLONOPIN) 1 MG tablet Take 1 tablet (1 mg total) by mouth 2 (two) times daily. As directed, caution drowsiness  60 tablet  0  . escitalopram (LEXAPRO) 20 MG tablet Take 1 tablet (20 mg total) by mouth daily.  30 tablet  0  .  HYDROcodone-acetaminophen (NORCO/VICODIN) 5-325 MG per tablet Take 1 tablet by mouth every 6 (six) hours as needed (headache).  60 tablet  0  . lidocaine (XYLOCAINE JELLY) 2 % jelly Apply 1 application topically as needed.  30 mL  2  . nortriptyline (PAMELOR) 25 MG capsule Take 1 capsule (25 mg total) by mouth at bedtime.  30 capsule  0  . promethazine (PHENERGAN) 25 MG tablet Take 25 mg by mouth every 6 (six) hours as needed for nausea or vomiting.      . rizatriptan (MAXALT) 10 MG tablet Take 10 mg by mouth as needed for migraine. May repeat in 2 hours if needed      . zolpidem (AMBIEN) 10 MG tablet Take 10 mg by mouth at bedtime as needed for sleep.        Psychiatric Specialty Exam: Physical Exam  ROS  Blood pressure 115/74, pulse 73, temperature 98.8 F (37.1 C), temperature source Oral, resp. rate 16, SpO2 96.00%, not currently breastfeeding.There is no weight on file to calculate BMI.  General Appearance: Guarded  Eye Contact::  Minimal  Speech:  Clear and Coherent and Slow  Volume:  Decreased  Mood:  Anxious and Depressed  Affect:  Depressed and Flat  Thought Process:  Coherent and Goal Directed  Orientation:  Full (Time, Place, and Person)  Thought Content:  WDL  Suicidal Thoughts:  No  Homicidal Thoughts:  No  Memory:  Immediate;   Fair Recent;   Fair  Judgement:  Intact  Insight:  Fair  Psychomotor Activity:  Decreased  Concentration:  Fair  Recall:  [place] of Knowledge:Good  Language: Good  Akathisia:  NA  Handed:  Right  AIMS (if indicated):     Assets:  Communication Skills Desire for Improvement Financial Resources/Insurance Housing Intimacy Leisure Time Resilience Social Support Talents/Skills Transportation  Sleep:      Musculoskeletal: Strength & Muscle Tone: decreased Gait & Station: unable to stand Patient leans: N/A  Treatment Plan Summary: Daily contact with patient to assess and evaluate symptoms and progress in treatment Medication  management Discontinue lexapro - not helpful for depression Start Paxil CR 25 mg PO QD for depression Increase Nortriptyline 50 mg BID for migraine Continue Klonopin 1 mg PO BID for anxiety Follow up with psychiatric intake appointment at [place] at [place], [place]. 09/05/2014 9:39 AM

## 2014-09-05 NOTE — ED Notes (Signed)
Pt. Was brought back directly from triage and placed into TRauma A.  Pt. Is having a tonic-clonic seizure ,  Having twitching to her lt,. Cheek area, head is turned to lt. Side.  Lips are closed, airway intact and patent..  Bilateral arms are contracted inward.  Pt. Gave birth in August 2015 and pt.s husband and mother reports that she has been having seizures since delivery of her daughter.   Pt. Has been seen by our Neurology and [facility] for the seizures.  Family was taking pt, to her PCP today and pt. Had 1 seizure at the office and also a seizure en - route..  Pt. Is alert and answers questions but is stuttering.  No incontinence noted or injury to tongue.

## 2014-09-05 NOTE — Progress Notes (Signed)
  CARE MANAGEMENT ED NOTE 09/05/2014  Patient:  Penny [name], Penny [name]   Account Number:  [id number]  Date Initiated:  09/05/2014  Documentation initiated by:  Edwyna Shell  Subjective/Objective Assessment:   28 yo female presenitng to the ED with pseudoseizures     Subjective/Objective Assessment Detail:     Action/Plan:   Patient has follow up appointment [facility] in [place]   Action/Plan Detail:   Anticipated DC Date:       Status Recommendation to Physician:   Result of Recommendation:  Agreed    DC Planning Services  CM consult  Follow-up appt scheduled  Other    Choice offered to / List presented to:            Status of service:  Completed, signed off  ED Comments:   ED Comments Detail:  This CM was consulted by Dr. Louretta Shorten to assist with follow up mental health appointment for patient at [facility] in [place]. This CM contacted the [facility] and was informed that all appointments are made through their managing entity [facility]. This Cm then contacted [facility] at [phone], spoke with Jaclynn Guarneri licensed counselor, provided patient information, and had a walk-in hospital follow up intake assessment appointment time for the following day, 10/2 from 7:45-11:00am. She explained that a therapist will assess the patient, creat a treatment plan, and then schedule follow up appointments. She stated to have the patient bring her Medicaid card, ID, and hospital discharge paperwork. This information was written on the patient's discharge paperwork and explained to the patient spouse Penny [name]. Brad verbalized understanding and had no other questions or concerns.

## 2014-09-05 NOTE — BH Assessment (Signed)
[facility] will contact Dr. Donne Anon regarding the patient receiving a Tele Psych.

## 2014-09-05 NOTE — BH Assessment (Signed)
Writer spoke to Dr. Louretta Shorten and he will assess the patient and meet with the family regarding their collateral information.   Writer informed the nurse working with the patient.

## 2014-09-05 NOTE — ED Notes (Signed)
Vital signs stable. 

## 2014-09-05 NOTE — ED Provider Notes (Signed)
Received in sign out from Dr. Jeneen Rinks.  Pending TTS evaluation. Reevaluated the patient on multiple occasions. Family is frustrated with lack of psychiatry resources. Patient continues to complain of headache. Discussed with patient that at this time I am limited in what I can give her and found to be awake and alert for TTS evaluation. Following TTS evaluation, patient was given morphine for headache. She will stay to be evaluated by psychiatry in the morning. Per TTS, patient will likely be discharged for outpatient followup.  Merryl Hacker, MD 09/05/14 [phone]

## 2014-09-05 NOTE — ED Provider Notes (Signed)
10:52 AM I spoke with psychiatry who recommends discharge home and followup at [facility]. Psych recommends increasing her nortriptyline to 50 mg twice a day and starting her on Paxil CR 25 mg per day. Will also stop her Lexapro. I discussed these changes with the patient.   I have discussed the diagnosis/risks/treatment options with the patient and believe the pt to be eligible for discharge [facility]. We also discussed returning to the ED immediately if new or worsening sx occur. We discussed the sx which are most concerning (e.g., worsening depression) that necessitate immediate return. Medications administered to the patient during their visit and any new prescriptions provided to the patient are listed below.  Medications given during this visit Medications  LORazepam (ATIVAN) tablet 1 mg (not administered)  prochlorperazine (COMPAZINE) injection 10 mg (10 mg Intravenous Given 09/05/14 0036)  LORazepam (ATIVAN) 2 MG/ML injection (2 mg  Given 09/04/14 1110)  valproate (DEPACON) 500 mg in dextrose 5 % 50 mL IVPB (0 mg Intravenous Stopped 09/04/14 1438)  dexamethasone (DECADRON) injection 10 mg (10 mg Intravenous Given 09/04/14 1149)  acetaminophen (TYLENOL) tablet 1,000 mg (1,000 mg Oral Given 09/04/14 1702)  diphenhydrAMINE (BENADRYL) injection 12.5 mg (12.5 mg Intravenous Given 09/04/14 1937)  LORazepam (ATIVAN) injection 1 mg (1 mg Intravenous Given 09/05/14 0036)  morphine 4 MG/ML injection 4 mg (4 mg Intravenous Given 09/05/14 0131)  ondansetron (ZOFRAN) injection 4 mg (4 mg Intravenous Given 09/05/14 0131)    Discharge Medication List as of 09/05/2014 10:54 AM    START taking these medications   Details  !! nortriptyline (PAMELOR) 50 MG capsule Take 1 capsule (50 mg total) by mouth 2 (two) times daily., Starting 09/05/2014, Until Discontinued, Print    PARoxetine (PAXIL CR) 25 MG 24 hr tablet Take 1 tablet (25 mg total) by mouth daily., Starting 09/05/2014, Until  Discontinued, Print     !! - Potential duplicate medications found. Please discuss with provider.     Clinical Impression 1. Pseudoseizure   2. MDD (major depressive disorder), recurrent severe, without psychosis   3. Post partum depression      Pamella Pert, MD 09/05/14 1223

## 2014-09-05 NOTE — Discharge Instructions (Signed)
Please stop taking your Lexapro when you start taking the Paxil.

## 2014-09-05 NOTE — ED Notes (Signed)
Dr. Louretta Shorten at bedside with patient and family.

## 2014-09-06 ENCOUNTER — Ambulatory Visit (INDEPENDENT_AMBULATORY_CARE_PROVIDER_SITE_OTHER): Payer: Medicaid Other

## 2014-09-06 ENCOUNTER — Ambulatory Visit (INDEPENDENT_AMBULATORY_CARE_PROVIDER_SITE_OTHER): Payer: Medicaid Other | Admitting: Obstetrics and Gynecology

## 2014-09-06 ENCOUNTER — Encounter: Payer: Self-pay | Admitting: Obstetrics and Gynecology

## 2014-09-06 VITALS — BP 128/72

## 2014-09-06 DIAGNOSIS — R350 Frequency of micturition: Secondary | ICD-10-CM

## 2014-09-06 DIAGNOSIS — M545 Low back pain: Secondary | ICD-10-CM

## 2014-09-06 DIAGNOSIS — N949 Unspecified condition associated with female genital organs and menstrual cycle: Secondary | ICD-10-CM

## 2014-09-06 LAB — POCT URINALYSIS DIPSTICK
Glucose, UA: NEGATIVE
Ketones, UA: NEGATIVE
NITRITE UA: NEGATIVE
PROTEIN UA: NEGATIVE

## 2014-09-06 NOTE — Progress Notes (Signed)
This chart was scribed by Ludger Nutting, Medical Scribe, for Dr. Mallory Shirk on 09/06/14 at 12:25 PM. This chart was reviewed by Dr. Mallory Shirk for accuracy.   [facility] Visit  Patient name: Penny [name] MRN [id number]  Date of birth: January 13, 1986  CC & HPI:  Penny [name] is a 28 y.o. female presenting today for discussion of ultrasound. Patient states she has unchanged vaginal bleeding with associated passing of clots and material that appears to be tissue. Patient is currently in severe pain after a pelvic ultrasound. Per chart review and patient's husband, patient was seen by psych who recommends stopping Lexapro and increasing her nortriptyline to 50 mg twice a day and starting her on Paxil CR 25 mg per day. Pt is unwilling and or unable to tolerate pelvic pain, and will be scheduled for hysteroscopy to remove retained tissue.  ROS:  +vaginal bleeding with clots and tissue  Pertinent History Reviewed:   Reviewed: Significant for see HPI , PMH Medical         Past Medical History  Diagnosis Date  . Migraines   . Interstitial cystitis   . IUD FEB 2010  . Gastritis JULY 2011  . Depression   . 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 Fatty liver   . Obesity (BMI 30-39.9) 2011 228 LBS BMI 36.8  . Iron deficiency anemia 07/23/2010  . Irritable bowel syndrome 2012 DIARRHEA    JUN 2012 TTG IgA 14.9  . Elevated liver enzymes JUL 2011ALK PHOS 111-127 AST  143-267 ALT  213-321T BILI 0.6  ALB  3.7-4.06 Jun 2011 ALK PHOS 118 AST 24 ALT 42 T BILI 0.4 ALB 3.9  . Chronic daily headache   . Ovarian cyst   . PONV (postoperative nausea and vomiting)   . ADD (attention deficit disorder)   . RLQ abdominal pain 07/18/2013  . Menorrhagia 07/18/2013  . Anxiety   . Potassium (K) deficiency   . Dysmenorrhea 09/18/2013  . Stress 09/18/2013  . Patient desires pregnancy 09/18/2013  . Pregnant 12/25/2013  . Blood transfusion without reported diagnosis   . Heart murmur   . Gastric  bypass status for obesity                               Surgical Hx:    Past Surgical History  Procedure Laterality Date  . Gab  2007    in [place]-POUCH 5 CM  . Cholecystectomy  2005    biliary dyskinesia  . Cath self every nite      for sodium bicarb injection (discontinued 2013)  . Colonoscopy  JUN 2012 ABD PN/DIARRHEA WITH PROPOFOL    NL COLON  . Upper gastrointestinal endoscopy  JULY 2011 NAUSEA-D125,V6, PH 25    Bx; GASTRITIS, POUCH-5 CM LONG  . Dilation and curettage of uterus    . Tonsillectomy    . Gastric bypass  06/2006  . Savory dilation  06/20/2012    IOX:BDZH gastritis/Ulcer in the mid jejunum  . Tonsillectomy and adenoidectomy    . Esophagogastroduodenoscopy    . Wisdom tooth extraction    . Repair vaginal cuff N/A 07/30/2014    Procedure: REPAIR VAGINAL CUFF;  Surgeon: Mora Bellman, MD;  Location: [facility];  Service: Gynecology;  Laterality: N/A;   Medications: Reviewed & Updated - see associated section  Current outpatient prescriptions:acetaminophen (TYLENOL) 500 MG tablet, Take 1,000 mg by mouth every 6 (six) hours as needed for headache (pain). , Disp: , Rfl: ;  Ascorbic Acid (VITAMIN C) 100 MG CHEW, Chew 200 mg by mouth at bedtime. , Disp: , Rfl: ;  BIOTIN PO, Take 1 tablet by mouth at bedtime. , Disp: , Rfl:  butalbital-acetaminophen-caffeine (FIORICET, ESGIC) 50-325-40 MG per tablet, Take 1-2 tablets by mouth every 6 (six) hours as needed for headache. , Disp: , Rfl: ;  Cholecalciferol (VITAMIN D3) 2000 UNITS TABS, Take 2,000 Units by mouth at bedtime. , Disp: , Rfl: ;  clonazePAM (KLONOPIN) 1 MG tablet, Take 1 tablet (1 mg total) by mouth 2 (two) times daily. As directed, caution drowsiness, Disp: 60 tablet, Rfl: 0 HYDROcodone-acetaminophen (NORCO/VICODIN) 5-325 MG per tablet, Take 1 tablet by mouth every 6 (six) hours as needed (headache)., Disp: 60 tablet, Rfl: 0;  lidocaine (XYLOCAINE JELLY) 2 % jelly, Apply 1 application topically as  needed., Disp: 30 mL, Rfl: 2;  nortriptyline (PAMELOR) 50 MG capsule, Take 1 capsule (50 mg total) by mouth 2 (two) times daily., Disp: 60 capsule, Rfl: 1 PARoxetine (PAXIL CR) 25 MG 24 hr tablet, Take 1 tablet (25 mg total) by mouth daily., Disp: 30 tablet, Rfl: 1;  promethazine (PHENERGAN) 25 MG tablet, Take 25 mg by mouth every 6 (six) hours as needed for nausea or vomiting., Disp: , Rfl: ;  zolpidem (AMBIEN) 10 MG tablet, Take 10 mg by mouth at bedtime as needed for sleep., Disp: , Rfl:  rizatriptan (MAXALT) 10 MG tablet, Take 10 mg by mouth as needed for migraine. May repeat in 2 hours if needed, Disp: , Rfl:    Social History: Reviewed -  reports that she quit smoking about 7 months ago. Her smoking use included Cigarettes. She smoked 0.00 packs per day for 5 years. She has never used smokeless tobacco.  Objective Findings:  Vitals: Blood pressure 128/72, not currently breastfeeding.  Physical Examination: General appearance - alert, well appearing, and in no distress, oriented to person, place, and time and chronically ill appearing, tearful, refusing to consider cytotec due to pain intolerance Mental status - alert, oriented to person, place, and time, depressed mood, anxious Pelvic - examination not indicated see u/s report   Assessment & Plan:   A:  1. ? REtained POC s/p delivery 2. Pseudoseizures 3. Persistent postpartum bleeding  P:  1. Schedule hysteroscopy with D&C next week.  2. Follow up in 10 days in office postop visit

## 2014-09-08 ENCOUNTER — Other Ambulatory Visit: Payer: Self-pay | Admitting: Obstetrics and Gynecology

## 2014-09-08 IMAGING — CR DG CERVICAL SPINE COMPLETE 4+V
7 series · 7 of 7 positions shown · non-contrast
Comparison: None

CLINICAL DATA: Neck and right arm pain.

CERVICAL SPINE - COMPLETE 4+ VIEW

[view not recorded (1 of 7)]
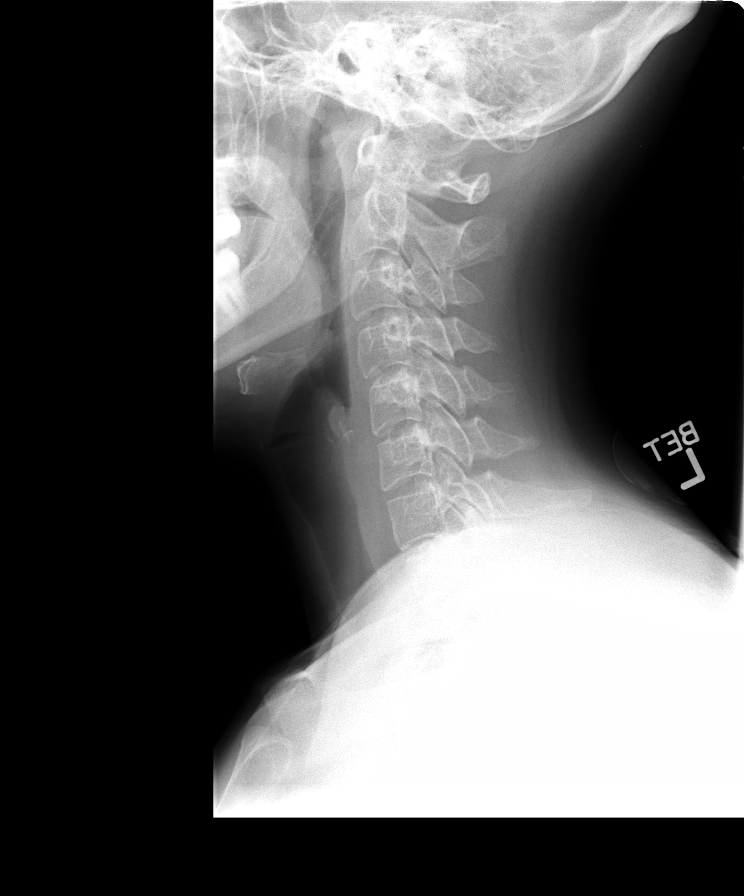

[view not recorded (2 of 7)]
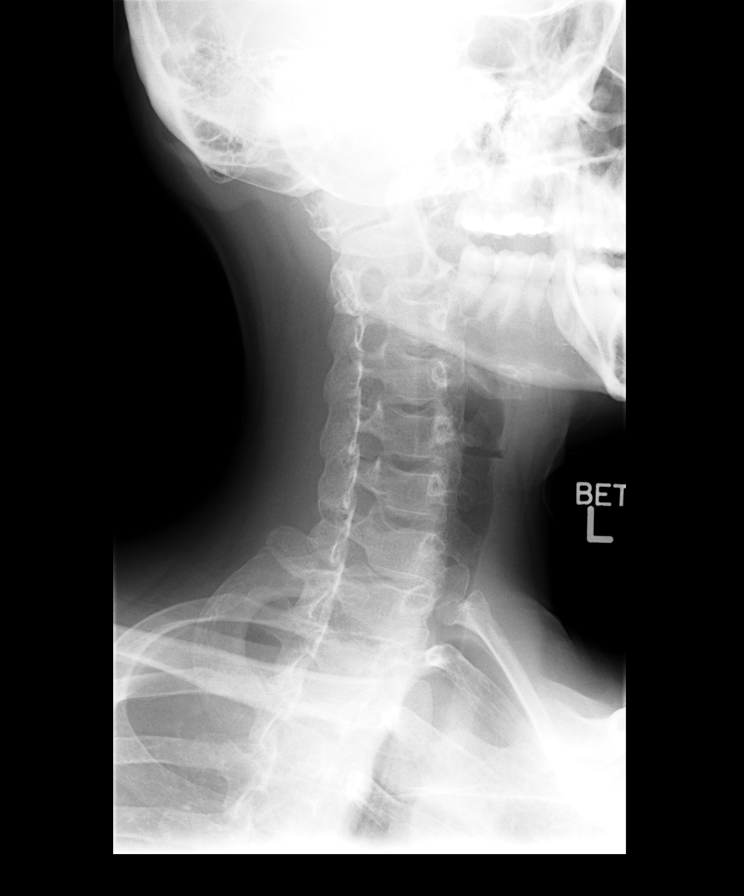

[view not recorded (3 of 7)]
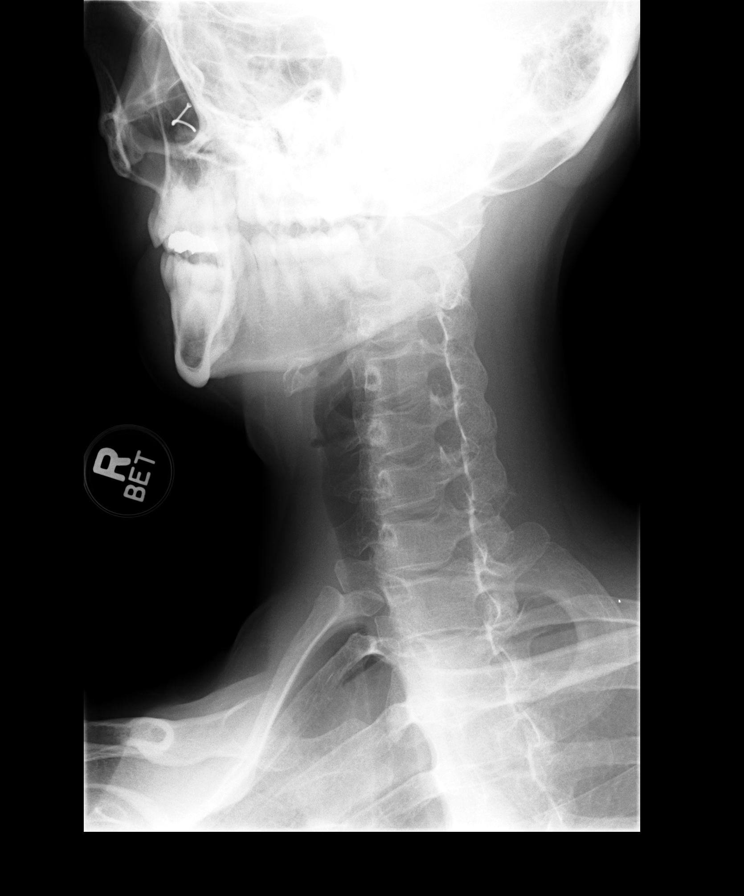

[view not recorded (4 of 7)]
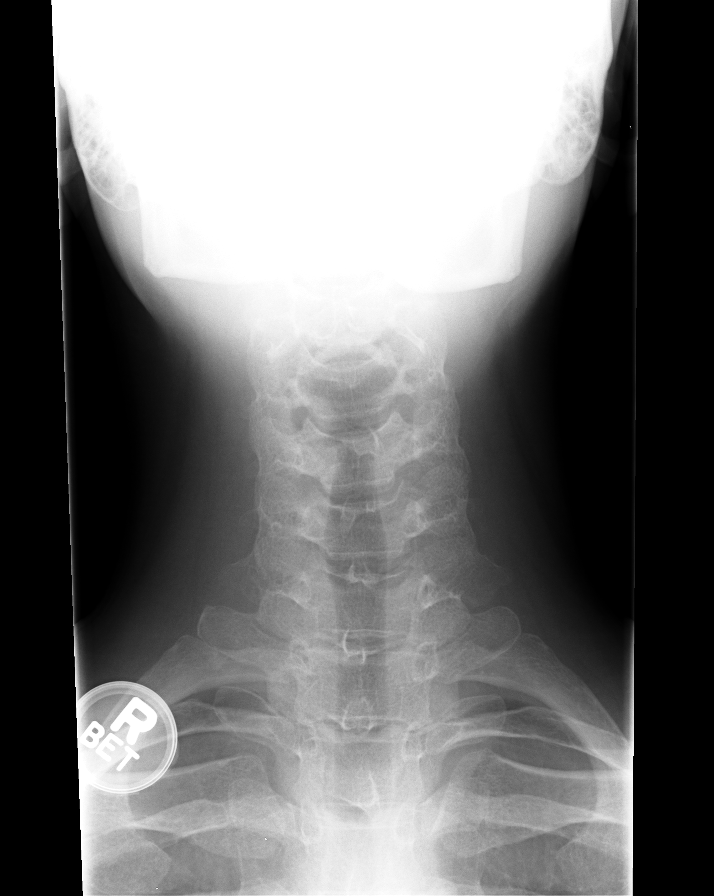

[view not recorded (5 of 7)]
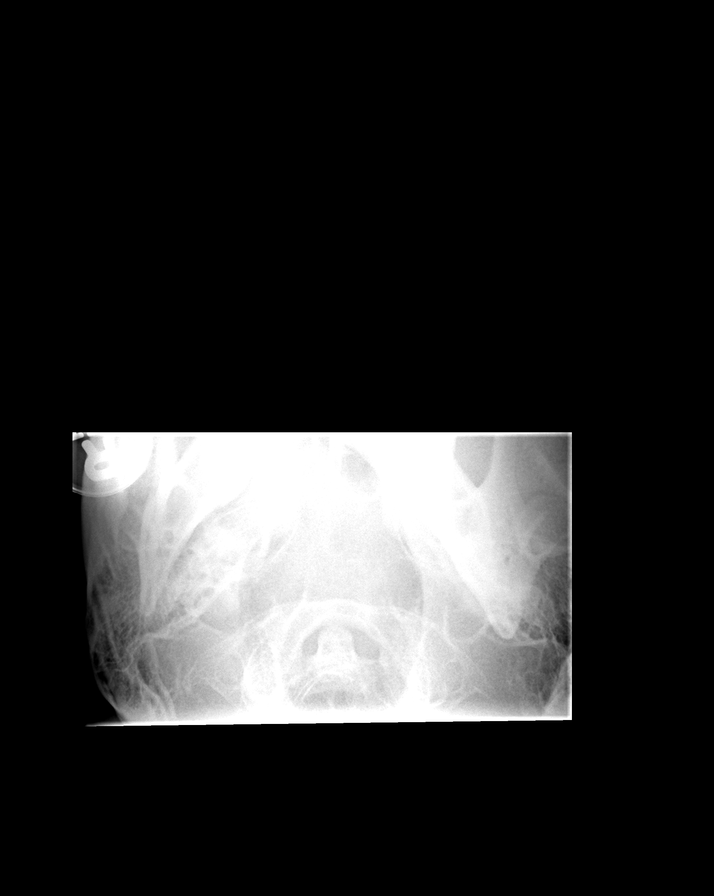

[view not recorded (6 of 7)]
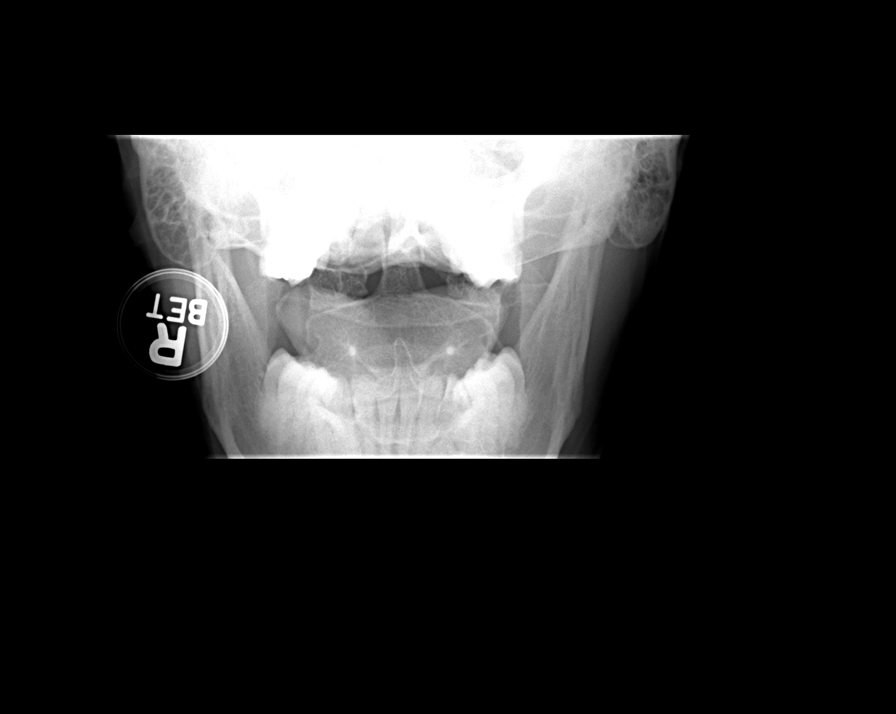

[view not recorded (7 of 7)]
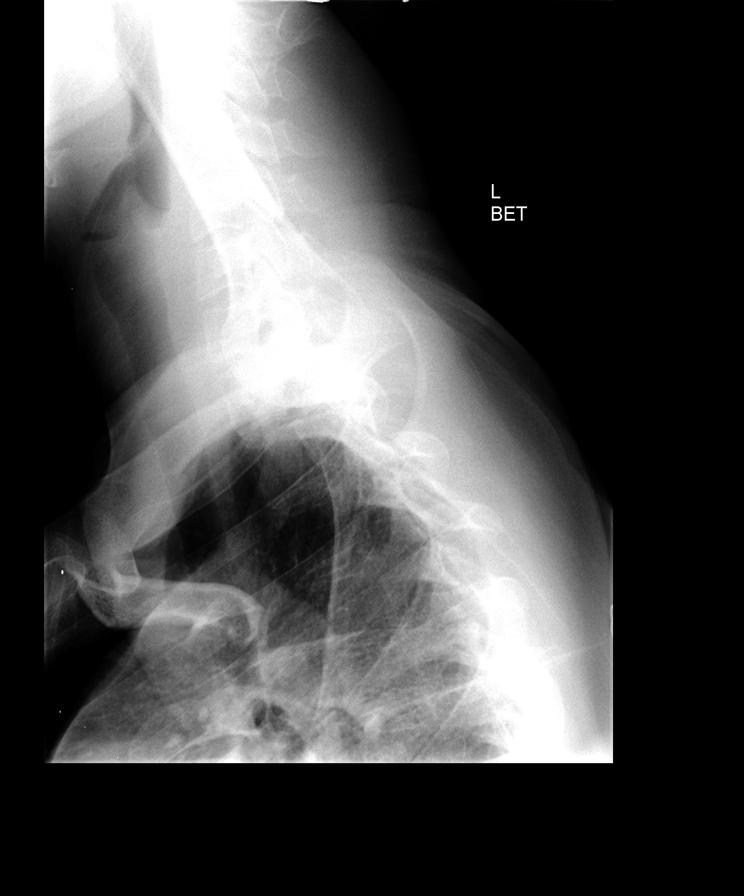

[7 of 7 positions shown; findings below may reference images not displayed]

FINDINGS: The lateral film demonstrates normal alignment of the
cervical vertebral bodies.  Disc spaces and vertebral bodies are
maintained.  No acute bony findings or abnormal prevertebral soft
tissue swelling.

The oblique films demonstrate normally aligned articular facets and
patent neural foramen.  The C1-C2 articulations are maintained.
The lung apices are clear.
IMPRESSION: Normal alignment and no acute bony findings.

## 2014-09-08 NOTE — H&P (Signed)
Dr. Mallory Shirk for accuracy.  [facility] Visit   Patient name: Penny [name] MRN [id number] Date of birth: 1986/08/20  CC & HPI:   Penny [name] is a 28 y.o. female presenting today for discussion of ultrasound. Patient states she has unchanged vaginal bleeding with associated passing of clots and material that appears to be tissue. Patient is currently in severe pain after a pelvic ultrasound. Per chart review and patient's husband, patient was seen by psych who recommends stopping Lexapro and increasing her nortriptyline to 50 mg twice a day and starting her on Paxil CR 25 mg per day. Pt is unwilling and or unable to tolerate pelvic pain, and will be scheduled for hysteroscopy to remove retained tissue.  ROS:   +vaginal bleeding with clots and tissue  Pertinent History Reviewed:   Reviewed: Significant for see HPI , PMH  Medical  Past Medical History   Diagnosis  Date   .  Migraines    .  Interstitial cystitis    .  IUD  FEB 2010   .  Gastritis  JULY 2011   .  Depression    .  Anemia  2011     2o to GASTRIC BYPASS   .  Fatty liver    .  Obesity (BMI 30-39.9)  2011 228 LBS BMI 36.8   .  Iron deficiency anemia  07/23/2010   .  Irritable bowel syndrome  2012 DIARRHEA     JUN 2012 TTG IgA 14.9   .  Elevated liver enzymes  JUL 2011ALK PHOS 111-127 AST 143-267 ALT 213-321T BILI 0.6 ALB 3.7-4.06 Jun 2011 ALK PHOS 118 AST 24 ALT 42 T BILI 0.4 ALB 3.9   .  Chronic daily headache    .  Ovarian cyst    .  PONV (postoperative nausea and vomiting)    .  ADD (attention deficit disorder)    .  RLQ abdominal pain  07/18/2013   .  Menorrhagia  07/18/2013   .  Anxiety    .  Potassium (K) deficiency    .  Dysmenorrhea  09/18/2013   .  Stress  09/18/2013   .  Patient desires pregnancy  09/18/2013   .  Pregnant  12/25/2013   .  Blood transfusion without reported diagnosis    .  Heart murmur    .  Gastric bypass status for obesity    Surgical Hx:  Past Surgical History    Procedure  Laterality  Date   .  Gab   2007     in [place]-POUCH 5 CM   .  Cholecystectomy   2005     biliary dyskinesia   .  Cath self every nite       for sodium bicarb injection (discontinued 2013)   .  Colonoscopy   JUN 2012 ABD PN/DIARRHEA WITH PROPOFOL     NL COLON   .  Upper gastrointestinal endoscopy   JULY 2011 NAUSEA-D125,V6, PH 25     Bx; GASTRITIS, POUCH-5 CM LONG   .  Dilation and curettage of uterus     .  Tonsillectomy     .  Gastric bypass   06/2006   .  Savory dilation   06/20/2012     OFB:PZWC gastritis/Ulcer in the mid jejunum   .  Tonsillectomy and adenoidectomy     .  Esophagogastroduodenoscopy     .  Wisdom tooth extraction     .  Repair vaginal cuff  N/A  07/30/2014     Procedure: REPAIR VAGINAL CUFF; Surgeon: Mora Bellman, MD; Location: [facility]; Service: Gynecology; Laterality: N/A;   Medications: Reviewed & Updated - see associated section  Current outpatient prescriptions:acetaminophen (TYLENOL) 500 MG tablet, Take 1,000 mg by mouth every 6 (six) hours as needed for headache (pain). , Disp: , Rfl: ; Ascorbic Acid (VITAMIN C) 100 MG CHEW, Chew 200 mg by mouth at bedtime. , Disp: , Rfl: ; BIOTIN PO, Take 1 tablet by mouth at bedtime. , Disp: , Rfl:  butalbital-acetaminophen-caffeine (FIORICET, ESGIC) 50-325-40 MG per tablet, Take 1-2 tablets by mouth every 6 (six) hours as needed for headache. , Disp: , Rfl: ; Cholecalciferol (VITAMIN D3) 2000 UNITS TABS, Take 2,000 Units by mouth at bedtime. , Disp: , Rfl: ; clonazePAM (KLONOPIN) 1 MG tablet, Take 1 tablet (1 mg total) by mouth 2 (two) times daily. As directed, caution drowsiness, Disp: 60 tablet, Rfl: 0  HYDROcodone-acetaminophen (NORCO/VICODIN) 5-325 MG per tablet, Take 1 tablet by mouth every 6 (six) hours as needed (headache)., Disp: 60 tablet, Rfl: 0; lidocaine (XYLOCAINE JELLY) 2 % jelly, Apply 1 application topically as needed., Disp: 30 mL, Rfl: 2; nortriptyline (PAMELOR) 50 MG capsule, Take 1 capsule (50  mg total) by mouth 2 (two) times daily., Disp: 60 capsule, Rfl: 1  PARoxetine (PAXIL CR) 25 MG 24 hr tablet, Take 1 tablet (25 mg total) by mouth daily., Disp: 30 tablet, Rfl: 1; promethazine (PHENERGAN) 25 MG tablet, Take 25 mg by mouth every 6 (six) hours as needed for nausea or vomiting., Disp: , Rfl: ; zolpidem (AMBIEN) 10 MG tablet, Take 10 mg by mouth at bedtime as needed for sleep., Disp: , Rfl:  rizatriptan (MAXALT) 10 MG tablet, Take 10 mg by mouth as needed for migraine. May repeat in 2 hours if needed, Disp: , Rfl:  Social History: Reviewed - reports that she quit smoking about 7 months ago. Her smoking use included Cigarettes. She smoked 0.00 packs per day for 5 years. She has never used smokeless tobacco.  Objective Findings:   Vitals: Blood pressure 128/72, not currently breastfeeding.  Physical Examination: General appearance - alert, well appearing, and in no distress, oriented to person, place, and time and chronically ill appearing, tearful, refusing to consider cytotec due to pain intolerance  Mental status - alert, oriented to person, place, and time, depressed mood, anxious  Pelvic - examination not indicated see u/s report   Uterus 10.3 x 6.0 cm, anteverted  Endometrium *hyperchoic appearance = 3.0 x 2.4cm ?Retained Products  Right ovary 2.8 x 2.0 x 1.7 cm,  Left ovary 3.5 x 2.9 x 1.6 cm,  No free fluid or adnexal masses noted within the pelvis  Technician Comments:  Anteverted uterus noted with thickened hyperechoic endometrial cavity with ?retained products, cx appears closed, bilateral adnexa appears WNL no free fluid or adnexal masses noted within the pelvis  Lazarus Gowda  09/06/2014  11:48 AM  Slight retained tissue in endometrial cavity, Pt unwilling or unable to consent to Cytotec trial.  Will proceed to hysteroscopy, with Dilation and Curettage this week.  normal adnexal structures. No pelvic pathology.   Assessment & Plan:   A:  1. ? REtained POC s/p  delivery  2. Pseudoseizures  3. Persistent postpartum bleeding  P:  1. Schedule hysteroscopy with D&C next week.  2. Follow up in 10 days in office postop visit

## 2014-09-09 ENCOUNTER — Encounter (HOSPITAL_COMMUNITY): Payer: Self-pay

## 2014-09-09 ENCOUNTER — Encounter: Payer: Self-pay | Admitting: Obstetrics and Gynecology

## 2014-09-09 ENCOUNTER — Ambulatory Visit (INDEPENDENT_AMBULATORY_CARE_PROVIDER_SITE_OTHER): Payer: Medicaid Other | Admitting: Obstetrics and Gynecology

## 2014-09-09 ENCOUNTER — Telehealth: Payer: Self-pay | Admitting: *Deleted

## 2014-09-09 VITALS — BP 118/70 | Ht 66.0 in | Wt 229.0 lb

## 2014-09-09 DIAGNOSIS — Z309 Encounter for contraceptive management, unspecified: Secondary | ICD-10-CM | POA: Insufficient documentation

## 2014-09-09 DIAGNOSIS — Z302 Encounter for sterilization: Secondary | ICD-10-CM | POA: Insufficient documentation

## 2014-09-09 DIAGNOSIS — Z3040 Encounter for surveillance of contraceptives, unspecified: Secondary | ICD-10-CM

## 2014-09-09 DIAGNOSIS — Z0289 Encounter for other administrative examinations: Secondary | ICD-10-CM

## 2014-09-09 DIAGNOSIS — Z01818 Encounter for other preprocedural examination: Secondary | ICD-10-CM

## 2014-09-09 MED ORDER — HYDROCODONE-ACETAMINOPHEN 5-325 MG PO TABS: 1.0000 | ORAL_TABLET | Freq: Four times a day (QID) | ORAL | Status: DC | PRN

## 2014-09-09 MED ORDER — DOXYCYCLINE HYCLATE 100 MG PO CAPS: 100.0000 mg | ORAL_CAPSULE | Freq: Two times a day (BID) | ORAL | Status: DC

## 2014-09-09 NOTE — Progress Notes (Signed)
Patient ID: Penny [name], female   DOB: Aug 24, 1986, 28 y.o.   MRN: [id number] Pt worked in today for medication refill. Pt stats that she is out of her pain medication and needs some more. Pt stated that she took 2 tablets every time she took the medication and her husband stated that the pt took them every 4 hours.

## 2014-09-09 NOTE — Patient Instructions (Signed)
Beula Joyner Rester  09/09/2014   Your procedure is scheduled on:  09/12/2014  Report to [name] at 7:30 AM.  Call this number if you have problems the morning of surgery: [phone]   Remember:   Do not eat food or drink liquids after midnight.   Take these medicines the morning of surgery with A SIP OF WATER:   Fioracet, Klonopin, Hydrocodone, Pamelor, Paxil, Phenergan, Maxalt   Do not wear jewelry, make-up or nail polish.  Do not wear lotions, powders, or perfumes. You may wear deodorant.  Do not shave 48 hours prior to surgery. Men may shave face and neck.  Do not bring valuables to the hospital.  [facility] is not responsible for any belongings or valuables.               Contacts, dentures or bridgework may not be worn into surgery.  Leave suitcase in the car. After surgery it may be brought to your room.  For patients admitted to the hospital, discharge time is determined by your treatment team.               Patients discharged the day of surgery will not be allowed to drive home.  Name and phone number of your driver:   Special Instructions: Shower using CHG 2 nights before surgery and the night before surgery.  If you shower the day of surgery use CHG.  Use special wash - you have one bottle of CHG for all showers.  You should use approximately 1/3 of the bottle for each shower.   Please read over the following fact sheets that you were given: Surgical Site Infection Prevention and Anesthesia Post-op Instructions   PATIENT INSTRUCTIONS POST-ANESTHESIA  IMMEDIATELY FOLLOWING SURGERY:  Do not drive or operate machinery for the first twenty four hours after surgery.  Do not make any important decisions for twenty four hours after surgery or while taking narcotic pain medications or sedatives.  If you develop intractable nausea and vomiting or a severe headache please notify your doctor immediately.  FOLLOW-UP:  Please make an appointment with your surgeon as instructed. You do  not need to follow up with anesthesia unless specifically instructed to do so.  WOUND CARE INSTRUCTIONS (if applicable):  Keep a dry clean dressing on the anesthesia/puncture wound site if there is drainage.  Once the wound has quit draining you may leave it open to air.  Generally you should leave the bandage intact for twenty four hours unless there is drainage.  If the epidural site drains for more than 36-48 hours please call the anesthesia department.  QUESTIONS?:  Please feel free to call your physician or the hospital operator if you have any questions, and they will be happy to assist you.      Dilation and Curettage or Vacuum Curettage Dilation and curettage (D&C) and vacuum curettage are minor procedures. A D&C involves stretching (dilation) the cervix and scraping (curettage) the inside lining of the womb (uterus). During a D&C, tissue is gently scraped from the inside lining of the uterus. During a vacuum curettage, the lining and tissue in the uterus are removed with the use of gentle suction.  Curettage may be performed to either diagnose or treat a problem. As a diagnostic procedure, curettage is performed to examine tissues from the uterus. A diagnostic curettage may be performed for the following symptoms:   Irregular bleeding in the uterus.   Bleeding with the development of clots.   Spotting between menstrual  periods.   Prolonged menstrual periods.   Bleeding after menopause.   No menstrual period (amenorrhea).   A change in size and shape of the uterus.  As a treatment procedure, curettage may be performed for the following reasons:   Removal of an IUD (intrauterine device).   Removal of retained placenta after giving birth. Retained placenta can cause an infection or bleeding severe enough to require transfusions.   Abortion.   Miscarriage.   Removal of polyps inside the uterus.   Removal of uncommon types of noncancerous lumps (fibroids).  LET  [facility] CARE PROVIDER KNOW ABOUT:   Any allergies you have.   All medicines you are taking, including vitamins, herbs, eye drops, creams, and over-the-counter medicines.   Previous problems you or members of your family have had with the use of anesthetics.   Any blood disorders you have.   Previous surgeries you have had.   Medical conditions you have. RISKS AND COMPLICATIONS  Generally, this is a safe procedure. However, as with any procedure, complications can occur. Possible complications include:  Excessive bleeding.   Infection of the uterus.   Damage to the cervix.   Development of scar tissue (adhesions) inside the uterus, later causing abnormal amounts of menstrual bleeding.   Complications from the general anesthetic, if a general anesthetic is used.   Putting a hole (perforation) in the uterus. This is rare.  BEFORE THE PROCEDURE   Eat and drink before the procedure only as directed by your health care provider.   Arrange for someone to take you home.  PROCEDURE  This procedure usually takes about 15-30 minutes.  You will be given one of the following:  A medicine that numbs the area in and around the cervix (local anesthetic).   A medicine to make you sleep through the procedure (general anesthetic).  You will lie on your back with your legs in stirrups.   A warm metal or plastic instrument (speculum) will be placed in your vagina to keep it open and to allow the health care provider to see the cervix.  There are two ways in which your cervix can be softened and dilated. These include:   Taking a medicine.   Having thin rods (laminaria) inserted into your cervix.   A curved tool (curette) will be used to scrape cells from the inside lining of the uterus. In some cases, gentle suction is applied with the curette. The curette will then be removed.  AFTER THE PROCEDURE   You will rest in the recovery area until you are stable and  are ready to go home.   You may feel sick to your stomach (nauseous) or throw up (vomit) if you were given a general anesthetic.   You may have a sore throat if a tube was placed in your throat during general anesthesia.   You may have light cramping and bleeding. This may last for 2 days to 2 weeks after the procedure.   Your uterus needs to make a new lining after the procedure. This may make your next period late. Document Released: 11/22/2005 Document Revised: 07/25/2013 Document Reviewed: 06/21/2013 [facility] Patient Information 2015 [address], [place]. This information is not intended to replace advice given to you by your health care provider. Make sure you discuss any questions you have with your health care provider.

## 2014-09-09 NOTE — Telephone Encounter (Signed)
Dr. Glo Herring advised that the pt would need to make an appointment to discuss pain medication and that he was not impressed with her manipulation. I called pt and advised her that she would have to be seen to discuss getting a refill on her pain medication. The pt stated I just saw him the other day. I told her that because she had just been given a refill of 60 Hydrocodone on 08/30/14. The pt was worked in today to see Dr. Glo Herring at 1:45.

## 2014-09-09 NOTE — Telephone Encounter (Signed)
I called the pt and informed her of her Pre op appointment at [facility] which is tomorrow at 2:15 and her surgery is scheduled for Thursday morning at 9:00. Before completing the phone call the pt asked if there was any way she could get a refill on her pain medication. The pt stated that it is controlled but that she is having to take 2 tablets at a time and needs more medication. Pt states that she doesn't want to have to go to the ED. I advised the pt that I would discuss this with Dr. Glo Herring and that I would let her know what he says.

## 2014-09-09 NOTE — Progress Notes (Signed)
Patient ID: Penny [name], female   DOB: 12-27-1985, 28 y.o.   MRN: [id number]   [facility] Visit  Patient name: CEONNA FRAZZINI MRN [id number]  Date of birth: 05/03/1986  CC & HPI:  TAQUANNA BORRAS is a 28 y.o. female presenting today for a refill of Hydrocodone.  The pt's husband has been managing her medication.  He will give her a dose every 4 hours and 2 pills before bed.  She cannot tolerate Motrin due to a previous mini gastric bypass surgery.  She is 6 wks s/p delivery.  She has not had a normal period since delivery.  She had two third degree tears from delivery.    ROS:  All systems have been reviewed and are negative unless otherwise specified in the HPI.   Pertinent History Reviewed:   Reviewed: Significant for  Medical         Past Medical History  Diagnosis Date  . Migraines   . Interstitial cystitis   . IUD FEB 2010  . Gastritis JULY 2011  . Depression   . 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 Fatty liver   . Obesity (BMI 30-39.9) 2011 228 LBS BMI 36.8  . Iron deficiency anemia 07/23/2010  . Irritable bowel syndrome 2012 DIARRHEA    JUN 2012 TTG IgA 14.9  . Elevated liver enzymes JUL 2011ALK PHOS 111-127 AST  143-267 ALT  213-321T BILI 0.6  ALB  3.7-4.06 Jun 2011 ALK PHOS 118 AST 24 ALT 42 T BILI 0.4 ALB 3.9  . Chronic daily headache   . Ovarian cyst   . PONV (postoperative nausea and vomiting)   . ADD (attention deficit disorder)   . RLQ abdominal pain 07/18/2013  . Menorrhagia 07/18/2013  . Anxiety   . Potassium (K) deficiency   . Dysmenorrhea 09/18/2013  . Stress 09/18/2013  . Patient desires pregnancy 09/18/2013  . Pregnant 12/25/2013  . Blood transfusion without reported diagnosis   . Heart murmur   . Gastric bypass status for obesity                               Surgical Hx:    Past Surgical History  Procedure Laterality Date  . Gab  2007    in [place]-POUCH 5 CM  . Cholecystectomy  2005    biliary dyskinesia  . Cath self every  nite      for sodium bicarb injection (discontinued 2013)  . Colonoscopy  JUN 2012 ABD PN/DIARRHEA WITH PROPOFOL    NL COLON  . Upper gastrointestinal endoscopy  JULY 2011 NAUSEA-D125,V6, PH 25    Bx; GASTRITIS, POUCH-5 CM LONG  . Dilation and curettage of uterus    . Tonsillectomy    . Gastric bypass  06/2006  . Savory dilation  06/20/2012    ZBM:ZTAE gastritis/Ulcer in the mid jejunum  . Tonsillectomy and adenoidectomy    . Esophagogastroduodenoscopy    . Wisdom tooth extraction    . Repair vaginal cuff N/A 07/30/2014    Procedure: REPAIR VAGINAL CUFF;  Surgeon: Catalina Antigua, MD;  Location: [facility];  Service: Gynecology;  Laterality: N/A;   Medications: Reviewed & Updated - see associated section                      Current outpatient prescriptions:acetaminophen (TYLENOL) 500 MG tablet, Take 1,000 mg by mouth  every 6 (six) hours as needed for headache (pain). , Disp: , Rfl: ;  Ascorbic Acid (VITAMIN C) 100 MG CHEW, Chew 200 mg by mouth at bedtime. , Disp: , Rfl: ;  BIOTIN PO, Take 1 tablet by mouth at bedtime. , Disp: , Rfl: ;  Cholecalciferol (VITAMIN D3) 2000 UNITS TABS, Take 2,000 Units by mouth at bedtime. , Disp: , Rfl:  clonazePAM (KLONOPIN) 1 MG tablet, Take 1 tablet (1 mg total) by mouth 2 (two) times daily. As directed, caution drowsiness, Disp: 60 tablet, Rfl: 0;  HYDROcodone-acetaminophen (NORCO/VICODIN) 5-325 MG per tablet, Take 1 tablet by mouth every 6 (six) hours as needed (headache)., Disp: 60 tablet, Rfl: 0;  nortriptyline (PAMELOR) 50 MG capsule, Take 1 capsule (50 mg total) by mouth 2 (two) times daily., Disp: 60 capsule, Rfl: 1 PARoxetine (PAXIL CR) 25 MG 24 hr tablet, Take 1 tablet (25 mg total) by mouth daily., Disp: 30 tablet, Rfl: 1;  zolpidem (AMBIEN) 10 MG tablet, Take 10 mg by mouth at bedtime as needed for sleep., Disp: , Rfl: ;  butalbital-acetaminophen-caffeine (FIORICET, ESGIC) 50-325-40 MG per tablet, Take 1-2 tablets by mouth every 6 (six) hours as needed for  headache. , Disp: , Rfl:  lidocaine (XYLOCAINE JELLY) 2 % jelly, Apply 1 application topically as needed., Disp: 30 mL, Rfl: 2;  promethazine (PHENERGAN) 25 MG tablet, Take 25 mg by mouth every 6 (six) hours as needed for nausea or vomiting., Disp: , Rfl: ;  rizatriptan (MAXALT) 10 MG tablet, Take 10 mg by mouth as needed for migraine. May repeat in 2 hours if needed, Disp: , Rfl:    Social History: Reviewed -  reports that she quit smoking about 8 months ago. Her smoking use included Cigarettes. She has a 2.5 pack-year smoking history. She has never used smokeless tobacco.  Objective Findings:  Vitals: Blood pressure 118/70, height $RemoveBefore'5\' 6"'mMVqmHeFCDxhR$  (1.676 m), weight 229 lb (103.874 kg), not currently breastfeeding.  Physical Examination: General appearance - oriented to person, place, and time and overweight Pelvic - VULVA: normal appearing vulva with no masses, tenderness or lesions,  VAGINA: normal appearing vagina with normal color, no lesions, mucoid discharge CERVIX: normal appearing cervix without discharge or lesions, hevy mucoid d/c consistent with retained products with autolysis UTERUS: uterus is normal size, shape, consistency and nontender,pt reports vag sidewall is more tender than uterus.  ADNEXA: normal adnexa in size, nontender and no masses   Assessment & Plan:   A:  1. Retained products postpartum   P:  1. Follow-up in 3 days for D&C procedure       This chart was scribed for Jonnie Kind, MD by Donato Schultz, ED Scribe. This patient was seen in room 2 and the patient's care was started at 2:45 PM.

## 2014-09-09 NOTE — Addendum Note (Signed)
Addended by: Farley Ly on: 09/09/2014 03:10 PM   Modules accepted: Orders

## 2014-09-10 ENCOUNTER — Encounter (HOSPITAL_COMMUNITY): Payer: Self-pay

## 2014-09-10 ENCOUNTER — Encounter (HOSPITAL_COMMUNITY)
Admission: RE | Admit: 2014-09-10 | Discharge: 2014-09-10 | Disposition: A | Discharge status: Home / Self Care | Payer: Medicaid Other | Source: Ambulatory Visit | Attending: Obstetrics and Gynecology | Admitting: Obstetrics and Gynecology

## 2014-09-10 ENCOUNTER — Telehealth: Payer: Self-pay | Admitting: Family Medicine

## 2014-09-10 DIAGNOSIS — G43909 Migraine, unspecified, not intractable, without status migrainosus: Secondary | ICD-10-CM | POA: Diagnosis not present

## 2014-09-10 DIAGNOSIS — E669 Obesity, unspecified: Secondary | ICD-10-CM | POA: Diagnosis not present

## 2014-09-10 DIAGNOSIS — N711 Chronic inflammatory disease of uterus: Secondary | ICD-10-CM | POA: Diagnosis not present

## 2014-09-10 DIAGNOSIS — F410 Panic disorder [episodic paroxysmal anxiety] without agoraphobia: Secondary | ICD-10-CM | POA: Diagnosis not present

## 2014-09-10 DIAGNOSIS — R569 Unspecified convulsions: Secondary | ICD-10-CM | POA: Diagnosis not present

## 2014-09-10 DIAGNOSIS — F329 Major depressive disorder, single episode, unspecified: Secondary | ICD-10-CM | POA: Diagnosis not present

## 2014-09-10 DIAGNOSIS — Z9884 Bariatric surgery status: Secondary | ICD-10-CM | POA: Diagnosis not present

## 2014-09-10 DIAGNOSIS — F419 Anxiety disorder, unspecified: Secondary | ICD-10-CM | POA: Diagnosis not present

## 2014-09-10 DIAGNOSIS — Z6830 Body mass index (BMI) 30.0-30.9, adult: Secondary | ICD-10-CM | POA: Diagnosis not present

## 2014-09-10 DIAGNOSIS — N3010 Interstitial cystitis (chronic) without hematuria: Secondary | ICD-10-CM | POA: Diagnosis not present

## 2014-09-10 DIAGNOSIS — N840 Polyp of corpus uteri: Secondary | ICD-10-CM | POA: Diagnosis not present

## 2014-09-10 DIAGNOSIS — K760 Fatty (change of) liver, not elsewhere classified: Secondary | ICD-10-CM | POA: Diagnosis not present

## 2014-09-10 HISTORY — DX: Cardiac arrhythmia, unspecified: I49.9

## 2014-09-10 HISTORY — DX: Unspecified convulsions: R56.9

## 2014-09-10 LAB — CBC WITH DIFFERENTIAL/PLATELET
BASOS PCT: 0 % (ref 0–1)
Basophils Absolute: 0 10*3/uL (ref 0.0–0.1)
Eosinophils Absolute: 0.2 10*3/uL (ref 0.0–0.7)
Eosinophils Relative: 4 % (ref 0–5)
HEMATOCRIT: 30.4 % — AB (ref 36.0–46.0)
Hemoglobin: 9.8 g/dL — ABNORMAL LOW (ref 12.0–15.0)
LYMPHS ABS: 2.1 10*3/uL (ref 0.7–4.0)
Lymphocytes Relative: 30 % (ref 12–46)
MCH: 28.8 pg (ref 26.0–34.0)
MCHC: 32.2 g/dL (ref 30.0–36.0)
MCV: 89.4 fL (ref 78.0–100.0)
MONO ABS: 1.1 10*3/uL — AB (ref 0.1–1.0)
Monocytes Relative: 16 % — ABNORMAL HIGH (ref 3–12)
NEUTROS ABS: 3.5 10*3/uL (ref 1.7–7.7)
Neutrophils Relative %: 50 % (ref 43–77)
Platelets: 325 10*3/uL (ref 150–400)
RBC: 3.4 MIL/uL — AB (ref 3.87–5.11)
RDW: 14.1 % (ref 11.5–15.5)
WBC: 6.9 10*3/uL (ref 4.0–10.5)

## 2014-09-10 LAB — GC/CHLAMYDIA PROBE AMP
CT Probe RNA: NEGATIVE
GC Probe RNA: NEGATIVE

## 2014-09-10 LAB — URINALYSIS, ROUTINE W REFLEX MICROSCOPIC
Bilirubin Urine: NEGATIVE
Glucose, UA: NEGATIVE mg/dL
Ketones, ur: NEGATIVE mg/dL
NITRITE: NEGATIVE
PROTEIN: NEGATIVE mg/dL
SPECIFIC GRAVITY, URINE: 1.025 (ref 1.005–1.030)
UROBILINOGEN UA: 0.2 mg/dL (ref 0.0–1.0)
pH: 6 (ref 5.0–8.0)

## 2014-09-10 LAB — URINE MICROSCOPIC-ADD ON

## 2014-09-10 LAB — COMPREHENSIVE METABOLIC PANEL
ALBUMIN: 3 g/dL — AB (ref 3.5–5.2)
ALK PHOS: 179 U/L — AB (ref 39–117)
ALT: 100 U/L — ABNORMAL HIGH (ref 0–35)
ANION GAP: 11 (ref 5–15)
AST: 42 U/L — ABNORMAL HIGH (ref 0–37)
BILIRUBIN TOTAL: 0.2 mg/dL — AB (ref 0.3–1.2)
BUN: 12 mg/dL (ref 6–23)
CO2: 24 meq/L (ref 19–32)
Calcium: 8.8 mg/dL (ref 8.4–10.5)
Chloride: 103 mEq/L (ref 96–112)
Creatinine, Ser: 0.57 mg/dL (ref 0.50–1.10)
GLUCOSE: 95 mg/dL (ref 70–99)
POTASSIUM: 4.2 meq/L (ref 3.7–5.3)
Sodium: 138 mEq/L (ref 137–147)
Total Protein: 6.1 g/dL (ref 6.0–8.3)

## 2014-09-10 LAB — HCG, QUANTITATIVE, PREGNANCY: HCG, BETA CHAIN, QUANT, S: 1 m[IU]/mL (ref <5)

## 2014-09-10 NOTE — Telephone Encounter (Signed)
Patient has not been seen here for seizures. There is nothing in the system for me to go by.So I cant fill out FMLA. So what to do?

## 2014-09-11 NOTE — Pre-Procedure Instructions (Signed)
Dr Patsey Berthold made aware of HGB 9.8.

## 2014-09-12 ENCOUNTER — Encounter (HOSPITAL_COMMUNITY): Payer: Self-pay | Admitting: *Deleted

## 2014-09-12 ENCOUNTER — Encounter (HOSPITAL_COMMUNITY): Payer: Medicaid Other | Admitting: Anesthesiology

## 2014-09-12 ENCOUNTER — Ambulatory Visit (HOSPITAL_COMMUNITY): Payer: Medicaid Other | Admitting: Anesthesiology

## 2014-09-12 ENCOUNTER — Observation Stay (HOSPITAL_COMMUNITY)
Admission: RE | Admit: 2014-09-12 | Discharge: 2014-09-14 | Disposition: A | Discharge status: Home / Self Care | Payer: Medicaid Other | Source: Ambulatory Visit | Attending: Internal Medicine | Admitting: Internal Medicine

## 2014-09-12 ENCOUNTER — Encounter (HOSPITAL_COMMUNITY)
Admission: RE | Disposition: A | Discharge status: Home / Self Care | Payer: Self-pay | Source: Ambulatory Visit | Attending: Internal Medicine

## 2014-09-12 ENCOUNTER — Ambulatory Visit (HOSPITAL_COMMUNITY): Payer: Medicaid Other

## 2014-09-12 DIAGNOSIS — Z6830 Body mass index (BMI) 30.0-30.9, adult: Secondary | ICD-10-CM | POA: Insufficient documentation

## 2014-09-12 DIAGNOSIS — Z8669 Personal history of other diseases of the nervous system and sense organs: Secondary | ICD-10-CM

## 2014-09-12 DIAGNOSIS — N711 Chronic inflammatory disease of uterus: Secondary | ICD-10-CM | POA: Diagnosis not present

## 2014-09-12 DIAGNOSIS — F418 Other specified anxiety disorders: Secondary | ICD-10-CM

## 2014-09-12 DIAGNOSIS — R569 Unspecified convulsions: Secondary | ICD-10-CM | POA: Diagnosis not present

## 2014-09-12 DIAGNOSIS — G43909 Migraine, unspecified, not intractable, without status migrainosus: Secondary | ICD-10-CM | POA: Insufficient documentation

## 2014-09-12 DIAGNOSIS — F445 Conversion disorder with seizures or convulsions: Secondary | ICD-10-CM | POA: Diagnosis present

## 2014-09-12 DIAGNOSIS — N840 Polyp of corpus uteri: Secondary | ICD-10-CM | POA: Insufficient documentation

## 2014-09-12 DIAGNOSIS — N3010 Interstitial cystitis (chronic) without hematuria: Secondary | ICD-10-CM | POA: Diagnosis present

## 2014-09-12 DIAGNOSIS — K760 Fatty (change of) liver, not elsewhere classified: Secondary | ICD-10-CM | POA: Insufficient documentation

## 2014-09-12 DIAGNOSIS — R7989 Other specified abnormal findings of blood chemistry: Secondary | ICD-10-CM

## 2014-09-12 DIAGNOSIS — F410 Panic disorder [episodic paroxysmal anxiety] without agoraphobia: Secondary | ICD-10-CM | POA: Diagnosis present

## 2014-09-12 DIAGNOSIS — E669 Obesity, unspecified: Secondary | ICD-10-CM | POA: Insufficient documentation

## 2014-09-12 DIAGNOSIS — F329 Major depressive disorder, single episode, unspecified: Secondary | ICD-10-CM | POA: Insufficient documentation

## 2014-09-12 DIAGNOSIS — F419 Anxiety disorder, unspecified: Secondary | ICD-10-CM | POA: Insufficient documentation

## 2014-09-12 DIAGNOSIS — Z9884 Bariatric surgery status: Secondary | ICD-10-CM | POA: Insufficient documentation

## 2014-09-12 DIAGNOSIS — Z30430 Encounter for insertion of intrauterine contraceptive device: Secondary | ICD-10-CM

## 2014-09-12 DIAGNOSIS — R945 Abnormal results of liver function studies: Secondary | ICD-10-CM

## 2014-09-12 HISTORY — PX: HYSTEROSCOPY WITH D & C: SHX1775

## 2014-09-12 LAB — COMPREHENSIVE METABOLIC PANEL
ALT: 143 U/L — AB (ref 0–35)
AST: 303 U/L — AB (ref 0–37)
Albumin: 3 g/dL — ABNORMAL LOW (ref 3.5–5.2)
Alkaline Phosphatase: 293 U/L — ABNORMAL HIGH (ref 39–117)
Anion gap: 10 (ref 5–15)
BUN: 7 mg/dL (ref 6–23)
CO2: 27 mEq/L (ref 19–32)
CREATININE: 0.53 mg/dL (ref 0.50–1.10)
Calcium: 8.7 mg/dL (ref 8.4–10.5)
Chloride: 101 mEq/L (ref 96–112)
GFR calc Af Amer: >90 mL/min (ref >90)
GFR calc non Af Amer: >90 mL/min (ref >90)
Glucose, Bld: 86 mg/dL (ref 70–99)
Potassium: 4.2 mEq/L (ref 3.7–5.3)
SODIUM: 138 meq/L (ref 137–147)
TOTAL PROTEIN: 6.2 g/dL (ref 6.0–8.3)
Total Bilirubin: 0.5 mg/dL (ref 0.3–1.2)

## 2014-09-12 LAB — URINALYSIS, ROUTINE W REFLEX MICROSCOPIC
BILIRUBIN URINE: NEGATIVE
GLUCOSE, UA: NEGATIVE mg/dL
KETONES UR: NEGATIVE mg/dL
Nitrite: NEGATIVE
PH: 6.5 (ref 5.0–8.0)
Protein, ur: NEGATIVE mg/dL
Specific Gravity, Urine: 1.01 (ref 1.005–1.030)
Urobilinogen, UA: 0.2 mg/dL (ref 0.0–1.0)

## 2014-09-12 LAB — CBC
HCT: 27.9 % — ABNORMAL LOW (ref 36.0–46.0)
HEMOGLOBIN: 9 g/dL — AB (ref 12.0–15.0)
MCH: 28.6 pg (ref 26.0–34.0)
MCHC: 32.3 g/dL (ref 30.0–36.0)
MCV: 88.6 fL (ref 78.0–100.0)
PLATELETS: 230 10*3/uL (ref 150–400)
RBC: 3.15 MIL/uL — ABNORMAL LOW (ref 3.87–5.11)
RDW: 14 % (ref 11.5–15.5)
WBC: 7.8 10*3/uL (ref 4.0–10.5)

## 2014-09-12 LAB — URINE MICROSCOPIC-ADD ON

## 2014-09-12 SURGERY — DILATATION AND CURETTAGE /HYSTEROSCOPY
Anesthesia: General

## 2014-09-12 MED ORDER — OXYTOCIN 10 UNIT/ML IJ SOLN
INTRAMUSCULAR | Status: AC
  Filled 2014-09-12: qty 2

## 2014-09-12 MED ORDER — LACTATED RINGERS IV SOLN
INTRAVENOUS | Status: DC | PRN
  Administered 2014-09-12 (×2): via INTRAVENOUS

## 2014-09-12 MED ORDER — PROPOFOL 10 MG/ML IV BOLUS
INTRAVENOUS | Status: AC
  Filled 2014-09-12: qty 20

## 2014-09-12 MED ORDER — NORTRIPTYLINE HCL 25 MG PO CAPS
50.0000 mg | ORAL_CAPSULE | Freq: Two times a day (BID) | ORAL | Status: DC
  Administered 2014-09-12 – 2014-09-14 (×4): 50 mg via ORAL
  Filled 2014-09-12 (×4): qty 2

## 2014-09-12 MED ORDER — BUPIVACAINE-EPINEPHRINE (PF) 0.5% -1:200000 IJ SOLN
INTRAMUSCULAR | Status: AC
  Filled 2014-09-12: qty 30

## 2014-09-12 MED ORDER — FENTANYL CITRATE 0.05 MG/ML IJ SOLN
25.0000 ug | INTRAMUSCULAR | Status: AC
  Administered 2014-09-12 (×2): 25 ug via INTRAVENOUS

## 2014-09-12 MED ORDER — POTASSIUM CHLORIDE IN NACL 20-0.9 MEQ/L-% IV SOLN
INTRAVENOUS | Status: DC
  Administered 2014-09-12: 1000 mL via INTRAVENOUS
  Administered 2014-09-13 – 2014-09-14 (×3): via INTRAVENOUS

## 2014-09-12 MED ORDER — ACETAMINOPHEN 325 MG PO TABS
650.0000 mg | ORAL_TABLET | Freq: Four times a day (QID) | ORAL | Status: DC | PRN
  Filled 2014-09-12 (×2): qty 2

## 2014-09-12 MED ORDER — CIPROFLOXACIN HCL 250 MG PO TABS
250.0000 mg | ORAL_TABLET | Freq: Two times a day (BID) | ORAL | Status: DC
  Administered 2014-09-12 – 2014-09-14 (×4): 250 mg via ORAL
  Filled 2014-09-12 (×4): qty 1

## 2014-09-12 MED ORDER — LACTATED RINGERS IV SOLN
INTRAVENOUS | Status: DC
  Administered 2014-09-12: 09:00:00 via INTRAVENOUS

## 2014-09-12 MED ORDER — ROCURONIUM BROMIDE 50 MG/5ML IV SOLN
INTRAVENOUS | Status: AC
  Filled 2014-09-12: qty 1

## 2014-09-12 MED ORDER — LORAZEPAM 2 MG/ML IJ SOLN
1.0000 mg | INTRAMUSCULAR | Status: DC | PRN
  Administered 2014-09-12: 1 mg via INTRAVENOUS

## 2014-09-12 MED ORDER — OXYTOCIN 10 UNIT/ML IJ SOLN
40.0000 [IU] | INTRAVENOUS | Status: DC | PRN
  Administered 2014-09-12: 20 [IU] via INTRAVENOUS

## 2014-09-12 MED ORDER — MORPHINE SULFATE 4 MG/ML IJ SOLN
INTRAMUSCULAR | Status: AC
  Filled 2014-09-12: qty 1

## 2014-09-12 MED ORDER — PAROXETINE HCL ER 25 MG PO TB24
25.0000 mg | ORAL_TABLET | Freq: Every day | ORAL | Status: DC
  Administered 2014-09-13 – 2014-09-14 (×2): 25 mg via ORAL
  Filled 2014-09-12 (×4): qty 1

## 2014-09-12 MED ORDER — ZOLPIDEM TARTRATE 5 MG PO TABS: 5.0000 mg | ORAL_TABLET | Freq: Every evening | ORAL | Status: DC | PRN

## 2014-09-12 MED ORDER — CEFAZOLIN SODIUM-DEXTROSE 2-3 GM-% IV SOLR
INTRAVENOUS | Status: AC
  Filled 2014-09-12: qty 50

## 2014-09-12 MED ORDER — LIDOCAINE HCL (PF) 1 % IJ SOLN
INTRAMUSCULAR | Status: AC
  Filled 2014-09-12: qty 5

## 2014-09-12 MED ORDER — OXYCODONE HCL 5 MG PO TABS
5.0000 mg | ORAL_TABLET | ORAL | Status: DC | PRN
  Administered 2014-09-12 – 2014-09-13 (×2): 5 mg via ORAL
  Filled 2014-09-12 (×2): qty 1
  Filled 2014-09-12: qty 2

## 2014-09-12 MED ORDER — MIDAZOLAM HCL 2 MG/2ML IJ SOLN
1.0000 mg | INTRAMUSCULAR | Status: DC | PRN
  Administered 2014-09-12: 2 mg via INTRAVENOUS

## 2014-09-12 MED ORDER — FENTANYL CITRATE 0.05 MG/ML IJ SOLN
25.0000 ug | INTRAMUSCULAR | Status: DC | PRN
  Administered 2014-09-12 (×2): 25 ug via INTRAVENOUS
  Filled 2014-09-12: qty 2

## 2014-09-12 MED ORDER — CIPROFLOXACIN HCL 500 MG PO TABS: 500.0000 mg | ORAL_TABLET | Freq: Two times a day (BID) | ORAL | Status: DC

## 2014-09-12 MED ORDER — PROMETHAZINE HCL 25 MG/ML IJ SOLN
25.0000 mg | Freq: Once | INTRAMUSCULAR | Status: AC
  Administered 2014-09-12: 25 mg via INTRAVENOUS

## 2014-09-12 MED ORDER — INFLUENZA VAC SPLIT QUAD 0.5 ML IM SUSY
0.5000 mL | PREFILLED_SYRINGE | INTRAMUSCULAR | Status: AC
  Administered 2014-09-13: 0.5 mL via INTRAMUSCULAR
  Filled 2014-09-12: qty 0.5

## 2014-09-12 MED ORDER — MORPHINE SULFATE 2 MG/ML IJ SOLN
2.0000 mg | INTRAMUSCULAR | Status: DC | PRN
  Administered 2014-09-12 – 2014-09-13 (×4): 2 mg via INTRAVENOUS
  Filled 2014-09-12 (×4): qty 1

## 2014-09-12 MED ORDER — ONDANSETRON HCL 4 MG/2ML IJ SOLN: 4.0000 mg | Freq: Four times a day (QID) | INTRAMUSCULAR | Status: DC | PRN

## 2014-09-12 MED ORDER — SODIUM CHLORIDE 0.9 % IR SOLN
Status: DC | PRN
  Administered 2014-09-12: 3000 mL

## 2014-09-12 MED ORDER — ONDANSETRON HCL 4 MG/2ML IJ SOLN
4.0000 mg | Freq: Once | INTRAMUSCULAR | Status: DC | PRN
Start: 2014-09-12 — End: 2014-09-12

## 2014-09-12 MED ORDER — SODIUM CHLORIDE 0.9 % IJ SOLN
INTRAMUSCULAR | Status: AC
  Filled 2014-09-12: qty 10

## 2014-09-12 MED ORDER — PROMETHAZINE HCL 25 MG/ML IJ SOLN
INTRAMUSCULAR | Status: AC
  Filled 2014-09-12: qty 1

## 2014-09-12 MED ORDER — LIDOCAINE HCL (CARDIAC) 20 MG/ML IV SOLN
INTRAVENOUS | Status: DC | PRN
  Administered 2014-09-12: 50 mg via INTRAVENOUS

## 2014-09-12 MED ORDER — CLONAZEPAM 0.5 MG PO TABS
1.0000 mg | ORAL_TABLET | Freq: Two times a day (BID) | ORAL | Status: DC
  Administered 2014-09-12 – 2014-09-14 (×4): 1 mg via ORAL
  Filled 2014-09-12 (×4): qty 2

## 2014-09-12 MED ORDER — METRONIDAZOLE 500 MG PO TABS: 500.0000 mg | ORAL_TABLET | Freq: Two times a day (BID) | ORAL | Status: DC

## 2014-09-12 MED ORDER — PROCHLORPERAZINE EDISYLATE 5 MG/ML IJ SOLN
10.0000 mg | Freq: Four times a day (QID) | INTRAMUSCULAR | Status: DC | PRN
  Administered 2014-09-13: 10 mg via INTRAVENOUS
  Filled 2014-09-12 (×2): qty 2

## 2014-09-12 MED ORDER — PROPOFOL 10 MG/ML IV BOLUS
INTRAVENOUS | Status: DC | PRN
  Administered 2014-09-12: 180 mg via INTRAVENOUS

## 2014-09-12 MED ORDER — ONDANSETRON HCL 4 MG PO TABS: 4.0000 mg | ORAL_TABLET | Freq: Four times a day (QID) | ORAL | Status: DC | PRN

## 2014-09-12 MED ORDER — EPHEDRINE SULFATE 50 MG/ML IJ SOLN
INTRAMUSCULAR | Status: AC
Start: 2014-09-12 — End: 2014-09-12
  Filled 2014-09-12: qty 1

## 2014-09-12 MED ORDER — MORPHINE SULFATE 4 MG/ML IJ SOLN
4.0000 mg | Freq: Once | INTRAMUSCULAR | Status: AC
  Administered 2014-09-12: 4 mg via INTRAVENOUS

## 2014-09-12 MED ORDER — FENTANYL CITRATE 0.05 MG/ML IJ SOLN
INTRAMUSCULAR | Status: AC
  Filled 2014-09-12: qty 5

## 2014-09-12 MED ORDER — SODIUM CHLORIDE 0.9 % IJ SOLN
3.0000 mL | Freq: Two times a day (BID) | INTRAMUSCULAR | Status: DC
  Administered 2014-09-13 – 2014-09-14 (×2): 3 mL via INTRAVENOUS

## 2014-09-12 MED ORDER — HYDROCODONE-ACETAMINOPHEN 5-325 MG PO TABS: 1.0000 | ORAL_TABLET | Freq: Four times a day (QID) | ORAL | Status: DC | PRN

## 2014-09-12 MED ORDER — SUCCINYLCHOLINE CHLORIDE 20 MG/ML IJ SOLN
INTRAMUSCULAR | Status: AC
  Filled 2014-09-12: qty 1

## 2014-09-12 MED ORDER — DIPHENHYDRAMINE HCL 50 MG/ML IJ SOLN
INTRAMUSCULAR | Status: AC
  Filled 2014-09-12: qty 1

## 2014-09-12 MED ORDER — LORAZEPAM 2 MG/ML IJ SOLN
INTRAMUSCULAR | Status: AC
  Administered 2014-09-12: 1 mg via INTRAVENOUS
  Filled 2014-09-12: qty 1

## 2014-09-12 MED ORDER — ACETAMINOPHEN 650 MG RE SUPP: 650.0000 mg | Freq: Four times a day (QID) | RECTAL | Status: DC | PRN

## 2014-09-12 MED ORDER — MIDAZOLAM HCL 2 MG/2ML IJ SOLN
INTRAMUSCULAR | Status: AC
  Filled 2014-09-12: qty 2

## 2014-09-12 MED ORDER — BUTALBITAL-APAP-CAFFEINE 50-325-40 MG PO TABS: 1.0000 | ORAL_TABLET | Freq: Four times a day (QID) | ORAL | Status: DC | PRN

## 2014-09-12 MED ORDER — DIPHENHYDRAMINE HCL 50 MG/ML IJ SOLN
50.0000 mg | Freq: Once | INTRAMUSCULAR | Status: AC
  Administered 2014-09-12: 50 mg via INTRAVENOUS

## 2014-09-12 MED ORDER — CEFAZOLIN SODIUM-DEXTROSE 2-3 GM-% IV SOLR
2.0000 g | INTRAVENOUS | Status: AC
  Administered 2014-09-12: 2 g via INTRAVENOUS

## 2014-09-12 MED ORDER — BUPIVACAINE-EPINEPHRINE 0.5% -1:200000 IJ SOLN
INTRAMUSCULAR | Status: DC | PRN
  Administered 2014-09-12: 20 mL

## 2014-09-12 MED ORDER — FENTANYL CITRATE 0.05 MG/ML IJ SOLN
INTRAMUSCULAR | Status: DC | PRN
  Administered 2014-09-12: 25 ug via INTRAVENOUS
  Administered 2014-09-12 (×3): 50 ug via INTRAVENOUS
  Administered 2014-09-12: 25 ug via INTRAVENOUS
  Administered 2014-09-12: 50 ug via INTRAVENOUS

## 2014-09-12 MED ORDER — SODIUM CHLORIDE 0.9 % IR SOLN
Status: DC | PRN
  Administered 2014-09-12: 1000 mL

## 2014-09-12 MED ORDER — FENTANYL CITRATE 0.05 MG/ML IJ SOLN
INTRAMUSCULAR | Status: AC
  Filled 2014-09-12: qty 2

## 2014-09-12 MED ORDER — MORPHINE SULFATE 4 MG/ML IJ SOLN
4.0000 mg | Freq: Once | INTRAMUSCULAR | Status: AC
  Administered 2014-09-12: 4 mg via INTRAVENOUS
  Filled 2014-09-12: qty 1

## 2014-09-12 SURGICAL SUPPLY — 27 items
BAG HAMPER (MISCELLANEOUS) ×2 IMPLANT
CLOTH BEACON ORANGE TIMEOUT ST (SAFETY) ×2 IMPLANT
COVER LIGHT HANDLE STERIS (MISCELLANEOUS) ×4 IMPLANT
DECANTER SPIKE VIAL GLASS SM (MISCELLANEOUS) ×2 IMPLANT
FORMALIN 10 PREFIL 120ML (MISCELLANEOUS) ×2 IMPLANT
GLOVE BIOGEL PI IND STRL 7.0 (GLOVE) IMPLANT
GLOVE BIOGEL PI IND STRL 9 (GLOVE) ×1 IMPLANT
GLOVE BIOGEL PI INDICATOR 7.0 (GLOVE) ×1
GLOVE BIOGEL PI INDICATOR 9 (GLOVE)
GLOVE ECLIPSE 9.0 STRL (GLOVE) ×2 IMPLANT
GLOVE EXAM NITRILE MD LF STRL (GLOVE) ×1 IMPLANT
GLOVE SKINSENSE NS SZ7.0 (GLOVE) ×1
GLOVE SKINSENSE STRL SZ7.0 (GLOVE) IMPLANT
GOWN SPEC L3 XXLG W/TWL (GOWN DISPOSABLE) ×4 IMPLANT
GOWN STRL REUS W/TWL LRG LVL3 (GOWN DISPOSABLE) ×2 IMPLANT
INST SET HYSTEROSCOPY (KITS) ×2 IMPLANT
IV NS 1000ML (IV SOLUTION) ×2
IV NS 1000ML BAXH (IV SOLUTION) ×1 IMPLANT
IV NS IRRIG 3000ML ARTHROMATIC (IV SOLUTION) ×1 IMPLANT
KIT ROOM TURNOVER APOR (KITS) ×2 IMPLANT
MANIFOLD NEPTUNE II (INSTRUMENTS) ×2 IMPLANT
NS IRRIG 1000ML POUR BTL (IV SOLUTION) ×2 IMPLANT
PACK PERI GYN (CUSTOM PROCEDURE TRAY) ×2 IMPLANT
PAD ARMBOARD 7.5X6 YLW CONV (MISCELLANEOUS) ×2 IMPLANT
PAD TELFA 3X4 1S STER (GAUZE/BANDAGES/DRESSINGS) ×2 IMPLANT
SET BASIN LINEN APH (SET/KITS/TRAYS/PACK) ×2 IMPLANT
SET CYSTO W/LG BORE CLAMP LF (SET/KITS/TRAYS/PACK) ×2 IMPLANT

## 2014-09-12 NOTE — Anesthesia Procedure Notes (Signed)
Procedure Name: LMA Insertion Date/Time: 09/12/2014 10:16 AM Performed by: Andree Elk, Eleanora Guinyard A Pre-anesthesia Checklist: Patient identified, Timeout performed, Emergency Drugs available, Suction available and Patient being monitored Patient Re-evaluated:Patient Re-evaluated prior to inductionOxygen Delivery Method: Circle system utilized Preoxygenation: Pre-oxygenation with 100% oxygen Intubation Type: IV induction Ventilation: Mask ventilation without difficulty LMA: LMA inserted LMA Size: 4.0 Number of attempts: 1 Placement Confirmation: positive ETCO2 and breath sounds checked- equal and bilateral Tube secured with: Tape Dental Injury: Teeth and Oropharynx as per pre-operative assessment

## 2014-09-12 NOTE — Progress Notes (Signed)
Pt crying in pain.  Face to face discussion with the MD concerning this and orders were given verbally.

## 2014-09-12 NOTE — Anesthesia Postprocedure Evaluation (Signed)
  Anesthesia Post-op Note  [name]  Procedure(s) Performed: Procedure(s): DILATATION AND CURETTAGE /HYSTEROSCOPY (N/A)  Patient Location: PACU  Anesthesia Type:General  Level of Consciousness: awake, sedated and patient cooperative  Airway and Oxygen Therapy: Patient Spontanous Breathing and Patient connected to face mask oxygen  Post-op Pain: none  Post-op Assessment: Post-op Vital signs reviewed, Patient's Cardiovascular Status Stable, Respiratory Function Stable, Patent Airway, No signs of Nausea or vomiting and Pain level controlled  Post-op Vital Signs: Reviewed and stable  Last Vitals:  Filed Vitals:   09/12/14 0945  BP: 125/79  Pulse:   Temp:   Resp: 19    Complications: No apparent anesthesia complications

## 2014-09-12 NOTE — Progress Notes (Signed)
Pt came to post-op from PACU around 1225. Within about 3 minutes of arrival, pt grabbed my hand and asked for her husband stating "I'm about to have a seizure". Pt immediately postured, eyes rolled back and mouth started twitching. O2 sat was placed on pt and read 92% on RA. Seizure lasted approximately 20 minutes. Dr. Patsey Berthold, Anestheologist, was called to the bedside. Dr. Patsey Berthold came in and observed pt, no orders given except to contact Dr. Glo Herring, pt's OB/GYN surgeon. Dr. Glo Herring notified via telephone and said he would be here around 1430 to see pt. Not long after, around 1315 pt started having another seizure. Seizure lasted approximately 7 minutes. Pt's O2 sats started dropping to 83-85% on RA. Pt placed on 2L of O2 via [place]. O2 sats quickly increased up to 95-100%. Dr. Patsey Berthold notified again and gave order to contact Dr. Glo Herring again and have him come in now to see pt to give [name] a disposition for her. Dr. Glo Herring notified once again and said he would send Dr. Elonda Husky over from the office to see pt. Dr. Roderic Palau, Hospitalist, arrived not long after and asked about pt's situation saying Dr. Glo Herring had contacted him about pt. Dr. Roderic Palau went in to talk with pt and her husband and to examine pt. Dr. Roderic Palau said he would be admitting pt overnight with telemetry. Plan of care discussed with pt and her husband at the bedside. Awaiting bed placement at this time.

## 2014-09-12 NOTE — Anesthesia Preprocedure Evaluation (Addendum)
Anesthesia Evaluation  Patient identified by MRN, date of birth, ID band Patient awake    Reviewed: Allergy & Precautions, H&P , Patient's Chart, lab work & pertinent test results  Airway Mallampati: II TM Distance: >3 FB Neck ROM: full    Dental  (+) Teeth Intact   Pulmonary former smoker,  breath sounds clear to auscultation        Cardiovascular hypertension, On Medications Rhythm:regular Rate:Normal     Neuro/Psych    GI/Hepatic   Endo/Other  Morbid obesity  Renal/GU      Musculoskeletal   Abdominal   Peds  Hematology  (+) anemia ,   Anesthesia Other Findings       Reproductive/Obstetrics                          Anesthesia Physical Anesthesia Plan  ASA: III  Anesthesia Plan: General   Post-op Pain Management:    Induction: Intravenous  Airway Management Planned: LMA  Additional Equipment:   Intra-op Plan:   Post-operative Plan: Extubation in OR  Informed Consent: I have reviewed the patients History and Physical, chart, labs and discussed the procedure including the risks, benefits and alternatives for the proposed anesthesia with the patient or authorized representative who has indicated his/her understanding and acceptance.   Dental Advisory Given  Plan Discussed with:   Anesthesia Plan Comments:        Anesthesia Quick Evaluation

## 2014-09-12 NOTE — Brief Op Note (Addendum)
09/12/2014  10:51 AM  PATIENT:  Penny [name]  28 y.o. female  PRE-OPERATIVE DIAGNOSIS:  Retained products postpartum, desire for postpartum Intrauterine device placement    POST-OPERATIVE DIAGNOSIS:  Abnormal uterine bleeding, Retained products postpartum,  PROCEDURE:  Procedure(s): DILATATION AND CURETTAGE /HYSTEROSCOPY (N/A)  SURGEON:  Surgeon(s) and Role:    * Jonnie Kind, MD - Primary  PHYSICIAN ASSISTANT:   ASSISTANTS: None   ANESTHESIA:   general and paracervical block  EBL:  Total I/O In: 1300 [I.V.:1300] Out: 300 [Blood:300]  BLOOD ADMINISTERED:none  DRAINS: none   LOCAL MEDICATIONS USED:  MARCAINE    and Amount: 10 ml  SPECIMEN:  Source of Specimen:  Uterine curettings  DISPOSITION OF SPECIMEN:  PATHOLOGY  COUNTS:  YES  TOURNIQUET:  * No tourniquets in log *  DICTATION: .Dragon Dictation  PLAN OF CARE: Discharge to home after PACU  PATIENT DISPOSITION:  PACU - hemodynamically stable.   Delay start of Pharmacological VTE agent (>24hrs) due to surgical blood loss or risk of bleeding: not applicable Details of procedure: Patient was taken operating room prepped and draped for vaginal procedure. Timeout was conducted. Ancef was administered 2 g of intravenous. The cervix was grasped with single-tooth tenaculum, uterus sounded to 12 cm, dilated to 23 [place] allowing introduction of the 30 rigid hysteroscope identifying a bloody endometrial cavity with some dark tissue to suggestive of smaller is retained products as well as some whitish tissue. The smooth sharp curet was then used to scrape the uterine cavity and minimal tissue fragments obtained some of which were fresh tissue. These were taken for specimen. Ring forceps were then introduced into the uterine cavity and the uterus probed and 2 large dark areas of tissue that was malodorous was removed from the endometrial cavity. Curettage was then repeated lightly, and then ring forceps did not obtain any  additional tissues. Hysteroscopy was repeated and there was a bloody area with difficulty with visualization it appeared that the endometrial cavity was clear. The curetting had a uniform gritty feel appropriate for successful curettage. Patient was allowed to go recovery room in stable condition after paracervical block was applied with Marcaine with epinephrine. Sponge and needle counts correct Patient will be sent home on Cipro and Flagyl for 5 days Because of the malodor and suspicion of low-grade endometritis, we will wait in place the IUD a later time

## 2014-09-12 NOTE — Op Note (Signed)
C. the brief operative note for all details of the procedure

## 2014-09-12 NOTE — H&P (Signed)
[facility] Visit   Patient name: Penny [name] MRN [id number] Date of birth: 1986/05/05  CC & HPI:   Penny [name] is a 28 y.o. female presenting today for a refill of Hydrocodone. The pt's husband has been managing her medication. He will give her a dose every 4 hours and 2 pills before bed. She cannot tolerate Motrin due to a previous mini gastric bypass surgery. She is 6 wks s/p delivery. She has not had a normal period since delivery. She had two third degree tears from delivery.  ROS:   All systems have been reviewed and are negative unless otherwise specified in the HPI.  Pertinent History Reviewed:   Reviewed: Significant for  Medical  Past Medical History   Diagnosis  Date   .  Migraines    .  Interstitial cystitis    .  IUD  FEB 2010   .  Gastritis  JULY 2011   .  Depression    .  Anemia  2011     2o to GASTRIC BYPASS   .  Fatty liver    .  Obesity (BMI 30-39.9)  2011 228 LBS BMI 36.8   .  Iron deficiency anemia  07/23/2010   .  Irritable bowel syndrome  2012 DIARRHEA     JUN 2012 TTG IgA 14.9   .  Elevated liver enzymes  JUL 2011ALK PHOS 111-127 AST 143-267 ALT 213-321T BILI 0.6 ALB 3.7-4.06 Jun 2011 ALK PHOS 118 AST 24 ALT 42 T BILI 0.4 ALB 3.9   .  Chronic daily headache    .  Ovarian cyst    .  PONV (postoperative nausea and vomiting)    .  ADD (attention deficit disorder)    .  RLQ abdominal pain  07/18/2013   .  Menorrhagia  07/18/2013   .  Anxiety    .  Potassium (K) deficiency    .  Dysmenorrhea  09/18/2013   .  Stress  09/18/2013   .  Patient desires pregnancy  09/18/2013   .  Pregnant  12/25/2013   .  Blood transfusion without reported diagnosis    .  Heart murmur    .  Gastric bypass status for obesity    Surgical Hx:  Past Surgical History   Procedure  Laterality  Date   .  Gab   2007     in [place]-POUCH 5 CM   .  Cholecystectomy   2005     biliary dyskinesia   .  Cath self every nite       for sodium bicarb injection  (discontinued 2013)   .  Colonoscopy   JUN 2012 ABD PN/DIARRHEA WITH PROPOFOL     NL COLON   .  Upper gastrointestinal endoscopy   JULY 2011 NAUSEA-D125,V6, PH 25     Bx; GASTRITIS, POUCH-5 CM LONG   .  Dilation and curettage of uterus     .  Tonsillectomy     .  Gastric bypass   06/2006   .  Savory dilation   06/20/2012     DVV:OHYW gastritis/Ulcer in the mid jejunum   .  Tonsillectomy and adenoidectomy     .  Esophagogastroduodenoscopy     .  Wisdom tooth extraction     .  Repair vaginal cuff  N/A  07/30/2014     Procedure: REPAIR VAGINAL CUFF; Surgeon: Mora Bellman, MD; Location: [facility]; Service: Gynecology; Laterality:  N/A;   Medications: Reviewed & Updated - see associated section  Current outpatient prescriptions:acetaminophen (TYLENOL) 500 MG tablet, Take 1,000 mg by mouth every 6 (six) hours as needed for headache (pain). , Disp: , Rfl: ; Ascorbic Acid (VITAMIN C) 100 MG CHEW, Chew 200 mg by mouth at bedtime. , Disp: , Rfl: ; BIOTIN PO, Take 1 tablet by mouth at bedtime. , Disp: , Rfl: ; Cholecalciferol (VITAMIN D3) 2000 UNITS TABS, Take 2,000 Units by mouth at bedtime. , Disp: , Rfl:  clonazePAM (KLONOPIN) 1 MG tablet, Take 1 tablet (1 mg total) by mouth 2 (two) times daily. As directed, caution drowsiness, Disp: 60 tablet, Rfl: 0; HYDROcodone-acetaminophen (NORCO/VICODIN) 5-325 MG per tablet, Take 1 tablet by mouth every 6 (six) hours as needed (headache)., Disp: 60 tablet, Rfl: 0; nortriptyline (PAMELOR) 50 MG capsule, Take 1 capsule (50 mg total) by mouth 2 (two) times daily., Disp: 60 capsule, Rfl: 1  PARoxetine (PAXIL CR) 25 MG 24 hr tablet, Take 1 tablet (25 mg total) by mouth daily., Disp: 30 tablet, Rfl: 1; zolpidem (AMBIEN) 10 MG tablet, Take 10 mg by mouth at bedtime as needed for sleep., Disp: , Rfl: ; butalbital-acetaminophen-caffeine (FIORICET, ESGIC) 50-325-40 MG per tablet, Take 1-2 tablets by mouth every 6 (six) hours as needed for headache. , Disp: , Rfl:  lidocaine  (XYLOCAINE JELLY) 2 % jelly, Apply 1 application topically as needed., Disp: 30 mL, Rfl: 2; promethazine (PHENERGAN) 25 MG tablet, Take 25 mg by mouth every 6 (six) hours as needed for nausea or vomiting., Disp: , Rfl: ; rizatriptan (MAXALT) 10 MG tablet, Take 10 mg by mouth as needed for migraine. May repeat in 2 hours if needed, Disp: , Rfl:  Social History: Reviewed - reports that she quit smoking about 8 months ago. Her smoking use included Cigarettes. She has a 2.5 pack-year smoking history. She has never used smokeless tobacco.  Objective Findings:   Vitals: Blood pressure 118/70, height 5' 6" (1.676 m), weight 229 lb (103.874 kg), not currently breastfeeding.  Physical Examination: General appearance - oriented to person, place, and time and overweight  Pelvic - VULVA: normal appearing vulva with no masses, tenderness or lesions,  VAGINA: normal appearing vagina with normal color, no lesions, mucoid discharge  CERVIX: normal appearing cervix without discharge or lesions, hevy mucoid d/c consistent with retained products with autolysis  UTERUS: uterus is normal size, shape, consistency and nontender,pt reports vag sidewall is more tender than uterus.  ADNEXA: normal adnexa in size, nontender and no masses  Assessment & Plan:   A:  1. Retained products postpartum  2. Desire for postpartum IUD placement Plan': To OR Thursday for Hysteroscopy , dilation and curettage.  Will have suction D&C device available Additionally , will have IUD available, if no suspicion of acute infection, will Place iud as per patient request.

## 2014-09-12 NOTE — Transfer of Care (Signed)
Immediate Anesthesia Transfer of Care Note  [name]  Procedure(s) Performed: Procedure(s): DILATATION AND CURETTAGE /HYSTEROSCOPY (N/A)  Patient Location: PACU  Anesthesia Type:General  Level of Consciousness: awake, sedated and patient cooperative  Airway & Oxygen Therapy: Patient Spontanous Breathing and Patient connected to face mask oxygen  Post-op Assessment: Report given to PACU RN and Post -op Vital signs reviewed and stable  Post vital signs: Reviewed and stable  Complications: No apparent anesthesia complications

## 2014-09-12 NOTE — H&P (Signed)
[facility] History and Physical  Penny [name] HQI:[id number] DOB: 24-Sep-1986 DOA: 09/12/2014  Referring physician: Dr. Emelda Fear PCP: [name], MD   Chief Complaint: Possible seizure  HPI: Penny [name] is a 28 y.o. female who is postpartum 6 weeks, presented to [facility] today for elective D&C of retained uterine contents. The patient will was recently admitted at  and was transferred to [facility] for evaluation of migraine headaches and recurrent seizures. The patient had extensive evaluation including MRI brain/MRA of the brain/EEG/long-term monitoring. 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 It was recommended that she followup with neurology and psychiatry as an outpatient. She underwent D&C today without any immediate complications. In recovery, patient was noted to complain of severe headache, this was followed by a possible seizure episode. The episode was described as her head turning to the left, eyes rolling back in her head, and her extremities becoming extremely stiff with possible jerking movements. Her tongue was apparently sticking out as well. She does not have any urinary or bowel incontinence. She was on telemetry it was noted that she did become tachycardic with a heart rate in the 120s. She was also noted to be hypoxic with oxygen saturations in the 80s. The episode lasted approximately 15 minutes and resolved without any administration of Ativan. It was unclear whether her hypoxia/tachycardia was related to an actual seizure versus effects of anesthesia. The source was contacted by Dr. Emelda Fear regarding possible admission for observation.   Review of Systems:  Pneumatosis in mental status. Details are limited and obtained from husband. Noted in history of present illness.  Past Medical History  Diagnosis Date  . Migraines   .  Interstitial cystitis   . IUD FEB 2010  . Gastritis JULY 2011  . Depression   . 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 Fatty liver   . Obesity (BMI 30-39.9) 2011 228 LBS BMI 36.8  . Iron deficiency anemia 07/23/2010  . Irritable bowel syndrome 2012 DIARRHEA    JUN 2012 TTG IgA 14.9  . Elevated liver enzymes JUL 2011ALK PHOS 111-127 AST  143-267 ALT  213-321T BILI 0.6  ALB  3.7-4.06 Jun 2011 ALK PHOS 118 AST 24 ALT 42 T BILI 0.4 ALB 3.9  . Chronic daily headache   . Ovarian cyst   . PONV (postoperative nausea and vomiting)   . ADD (attention deficit disorder)   . RLQ abdominal pain 07/18/2013  . Menorrhagia 07/18/2013  . Anxiety   . Potassium (K) deficiency   . Dysmenorrhea 09/18/2013  . Stress 09/18/2013  . Patient desires pregnancy 09/18/2013  . Pregnant 12/25/2013  . Blood transfusion without reported diagnosis   . Heart murmur   . Gastric bypass status for obesity   . 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 Dysrhythmia    Past Surgical History  Procedure Laterality Date  . Gab  2007    in [place]-POUCH 5 CM  . Cholecystectomy  2005    biliary dyskinesia  . Cath self every nite      for sodium bicarb injection (discontinued 2013)  . Colonoscopy  JUN 2012 ABD PN/DIARRHEA WITH PROPOFOL    NL COLON  . Upper gastrointestinal endoscopy  JULY 2011 NAUSEA-D125,V6, PH 25    Bx; GASTRITIS, POUCH-5 CM LONG  . Dilation and curettage of uterus    .  Tonsillectomy    . Gastric bypass  06/2006  . Savory dilation  06/20/2012    GLO:VFIE gastritis/Ulcer in the mid jejunum  . Tonsillectomy and adenoidectomy    . Esophagogastroduodenoscopy    . Wisdom tooth extraction    . Repair vaginal cuff N/A 07/30/2014    Procedure: REPAIR VAGINAL CUFF;  Surgeon: Catalina Antigua, MD;  Location: [facility];  Service: Gynecology;  Laterality: N/A;   Social History:  reports that she quit smoking about 8 months ago. Her smoking use included Cigarettes. She has a 2.5 pack-year smoking history. She  has never used smokeless tobacco. She reports that she does not drink alcohol or use illicit drugs.  Allergies  Allergen Reactions  . Gabapentin Other (See Comments)    Pt states that she was unresponsive after taking this medication, but her vitals remained stable.    . Metoclopramide Hcl Anxiety and Other (See Comments)    Pt states that she felt like she was trapped in a box, and could not get out.  Pt also states that she had temporary loss of movement, weakness, and tingling.    . Topamax [Topiramate] Shortness Of Breath and Other (See Comments)    Adverse side effects.  Pt states this medication changed her taste for a short period of time and caused numbness and tingling in extremities.    . Codeine Nausea And Vomiting  . Latex Rash  . Tape Itching and Rash    Family History  Problem Relation Age of Onset  . Hemochromatosis Maternal Grandmother   . Migraines Maternal Grandmother   . Cancer Maternal Grandmother   . Hypertension Father   . Diabetes Father   . Coronary artery disease Father   . Migraines Paternal Grandmother   . Cancer Mother     breast  . Hemochromatosis Mother   . Coronary artery disease Paternal Grandfather      Prior to Admission medications   Medication Sig Start Date End Date Taking? Authorizing Provider  acetaminophen (TYLENOL) 500 MG tablet Take 1,000 mg by mouth every 6 (six) hours as needed for headache (pain).    Yes Historical Provider, MD  Ascorbic Acid (VITAMIN C) 100 MG CHEW Chew 200 mg by mouth at bedtime.    Yes Historical Provider, MD  BIOTIN PO Take 1 tablet by mouth at bedtime.    Yes Historical Provider, MD  butalbital-acetaminophen-caffeine (FIORICET, ESGIC) 50-325-40 MG per tablet Take 1-2 tablets by mouth every 6 (six) hours as needed for headache.    Yes Historical Provider, MD  Cholecalciferol (VITAMIN D3) 2000 UNITS TABS Take 2,000 Units by mouth at bedtime.    Yes Historical Provider, MD  clonazePAM (KLONOPIN) 1 MG tablet Take 1  tablet (1 mg total) by mouth 2 (two) times daily. As directed, caution drowsiness 09/02/14  Yes Babs Sciara, MD  doxycycline (VIBRAMYCIN) 100 MG capsule Take 1 capsule (100 mg total) by mouth 2 (two) times daily. 09/09/14  Yes Tilda Burrow, MD  lidocaine (XYLOCAINE JELLY) 2 % jelly Apply 1 application topically as needed. 08/05/14  Yes Lazaro Arms, MD  nortriptyline (PAMELOR) 50 MG capsule Take 1 capsule (50 mg total) by mouth 2 (two) times daily. 09/05/14  Yes Purvis Sheffield, MD  PARoxetine (PAXIL CR) 25 MG 24 hr tablet Take 1 tablet (25 mg total) by mouth daily. 09/05/14  Yes Purvis Sheffield, MD  promethazine (PHENERGAN) 25 MG tablet Take 25 mg by mouth every 6 (six) hours as needed for nausea or  vomiting.   Yes Historical Provider, MD  rizatriptan (MAXALT) 10 MG tablet Take 10 mg by mouth as needed for migraine. May repeat in 2 hours if needed   Yes Historical Provider, MD  zolpidem (AMBIEN) 10 MG tablet Take 10 mg by mouth at bedtime as needed for sleep.   Yes Historical Provider, MD  ciprofloxacin (CIPRO) 500 MG tablet Take 1 tablet (500 mg total) by mouth 2 (two) times daily. 09/12/14   Tilda Burrow, MD  HYDROcodone-acetaminophen (NORCO/VICODIN) 5-325 MG per tablet Take 1-2 tablets by mouth every 6 (six) hours as needed for moderate pain (pelvic pain). 09/12/14   Tilda Burrow, MD  metroNIDAZOLE (FLAGYL) 500 MG tablet Take 1 tablet (500 mg total) by mouth 2 (two) times daily. 09/12/14 09/19/14  Tilda Burrow, MD   Physical Exam: Filed Vitals:   09/12/14 1315 09/12/14 1320 09/12/14 1325 09/12/14 1330  BP: 123/81 114/77 123/81 124/80  Pulse:      Temp:      TempSrc:      Resp:  17 18 19   SpO2: 100% 100% 100% 100%    Wt Readings from Last 3 Encounters:  09/10/14 104.441 kg (230 lb 4 oz)  09/09/14 103.874 kg (229 lb)  09/03/14 96.616 kg (213 lb)    General:  Patient is somnolent, but wakes up to place and answers questions appropriately. Appears to be uncomfortable due to  pain Eyes: PERRL, normal lids, irises & conjunctiva ENT: grossly normal hearing, lips & tongue Neck: no LAD, masses or thyromegaly Cardiovascular: RRR, no m/r/g. No LE edema. Telemetry: SR, no arrhythmias  Respiratory: CTA bilaterally, no w/r/r. Normal respiratory effort. Abdomen: soft, diffusely tender, positive bowel sounds Skin: no rash or induration seen on limited exam Musculoskeletal: grossly normal tone BUE/BLE Psychiatric: grossly normal mood and affect, speech fluent and appropriate Neurologic: Left upper and lower extremities are weak, 3/5, right upper and lower extremities 4/5. Husband reports that this is a chronic finding with her migraines.           Labs on Admission:  Basic Metabolic Panel:  Recent Labs Lab 09/10/14 1355  NA 138  K 4.2  CL 103  CO2 24  GLUCOSE 95  BUN 12  CREATININE 0.57  CALCIUM 8.8   Liver Function Tests:  Recent Labs Lab 09/10/14 1355  AST 42*  ALT 100*  ALKPHOS 179*  BILITOT 0.2*  PROT 6.1  ALBUMIN 3.0*   No results found for this basename: LIPASE, AMYLASE,  in the last 168 hours No results found for this basename: AMMONIA,  in the last 168 hours CBC:  Recent Labs Lab 09/10/14 1355  WBC 6.9  NEUTROABS 3.5  HGB 9.8*  HCT 30.4*  MCV 89.4  PLT 325   Cardiac Enzymes: No results found for this basename: CKTOTAL, CKMB, CKMBINDEX, TROPONINI,  in the last 168 hours  BNP (last 3 results) No results found for this basename: PROBNP,  in the last 8760 hours CBG: No results found for this basename: GLUCAP,  in the last 168 hours  Radiological Exams on Admission: No results found.   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 Possible seizure versus pseudoseizure. Her husband reports that this is a regular occurrence with her  headaches. She reports that she's had approximately 3 episodes in the past week. She  has had an extensive evaluation at [facility] including MRI brain/MRV/EEG/long-term monitoring. Patient will be monitored on telemetry overnight. Additional benefit from outpatient neurology/psychiatric followup. We'll try to arrange his prior discharge. Check basic labs and lactic acid. She may have worsening tachycardia related to her pain. Hypoxia may be related to anesthesia. We'll check chest x-ray to rule out any underlying process. 2. Uncomplicated migraine headache. Husband reports that she frequently has left-sided weakness with her migraine headaches. We'll continue Fioricet. Use narcotics as needed. 3. Anxiety and depression. Continue outpatient psychotropics 4. Possible UTI. We'll start the patient on oral ciprofloxacin. Repeat urinalysis since sample available from 2 days ago appear to be infected. Check basic labs. 5. Elevated liver function tests. Appears to be a chronic finding. Continue to follow as an outpatient.   Code Status: full code DVT Prophylaxis: scds Family Communication: discussed with husband at the bedside Disposition Plan: hopefully discharge home in next 24 hours if stable  Time spent:  [facility] Pager [phone]

## 2014-09-13 ENCOUNTER — Ambulatory Visit (HOSPITAL_COMMUNITY): Payer: Medicaid Other

## 2014-09-13 ENCOUNTER — Encounter (HOSPITAL_COMMUNITY): Payer: Self-pay | Admitting: Obstetrics and Gynecology

## 2014-09-13 DIAGNOSIS — R7989 Other specified abnormal findings of blood chemistry: Secondary | ICD-10-CM

## 2014-09-13 DIAGNOSIS — F418 Other specified anxiety disorders: Secondary | ICD-10-CM

## 2014-09-13 LAB — URINE CULTURE
COLONY COUNT: NO GROWTH
Culture: NO GROWTH

## 2014-09-13 LAB — COMPREHENSIVE METABOLIC PANEL
ALT: 283 U/L — AB (ref 0–35)
AST: 418 U/L — ABNORMAL HIGH (ref 0–37)
Albumin: 2.8 g/dL — ABNORMAL LOW (ref 3.5–5.2)
Alkaline Phosphatase: 453 U/L — ABNORMAL HIGH (ref 39–117)
Anion gap: 10 (ref 5–15)
BILIRUBIN TOTAL: 0.5 mg/dL (ref 0.3–1.2)
BUN: 4 mg/dL — ABNORMAL LOW (ref 6–23)
CO2: 25 meq/L (ref 19–32)
CREATININE: 0.55 mg/dL (ref 0.50–1.10)
Calcium: 8.6 mg/dL (ref 8.4–10.5)
Chloride: 99 mEq/L (ref 96–112)
GFR calc Af Amer: >90 mL/min (ref >90)
Glucose, Bld: 106 mg/dL — ABNORMAL HIGH (ref 70–99)
Potassium: 4.1 mEq/L (ref 3.7–5.3)
Sodium: 134 mEq/L — ABNORMAL LOW (ref 137–147)
Total Protein: 6.2 g/dL (ref 6.0–8.3)

## 2014-09-13 LAB — HEPATITIS PANEL, ACUTE
HCV Ab: NEGATIVE
HEP A IGM: NONREACTIVE
Hep B C IgM: NONREACTIVE
Hepatitis B Surface Ag: NEGATIVE

## 2014-09-13 LAB — CBC
HEMATOCRIT: 27 % — AB (ref 36.0–46.0)
Hemoglobin: 8.8 g/dL — ABNORMAL LOW (ref 12.0–15.0)
MCH: 28.2 pg (ref 26.0–34.0)
MCHC: 32.6 g/dL (ref 30.0–36.0)
MCV: 86.5 fL (ref 78.0–100.0)
PLATELETS: 223 10*3/uL (ref 150–400)
RBC: 3.12 MIL/uL — ABNORMAL LOW (ref 3.87–5.11)
RDW: 13.8 % (ref 11.5–15.5)
WBC: 5.4 10*3/uL (ref 4.0–10.5)

## 2014-09-13 MED ORDER — HYDROMORPHONE HCL 1 MG/ML IJ SOLN
1.0000 mg | Freq: Once | INTRAMUSCULAR | Status: AC
  Administered 2014-09-13: 1 mg via INTRAVENOUS
  Filled 2014-09-13: qty 1

## 2014-09-13 MED ORDER — PROMETHAZINE HCL 25 MG/ML IJ SOLN
25.0000 mg | Freq: Once | INTRAMUSCULAR | Status: AC
  Administered 2014-09-13: 25 mg via INTRAVENOUS
  Filled 2014-09-13: qty 1

## 2014-09-13 MED ORDER — DIPHENHYDRAMINE HCL 50 MG/ML IJ SOLN
50.0000 mg | Freq: Once | INTRAMUSCULAR | Status: AC
Start: 2014-09-13 — End: 2014-09-13
  Administered 2014-09-13: 50 mg via INTRAVENOUS
  Filled 2014-09-13: qty 1

## 2014-09-13 MED ORDER — MORPHINE SULFATE 4 MG/ML IJ SOLN: 4.0000 mg | INTRAMUSCULAR | Status: DC | PRN

## 2014-09-13 MED ORDER — KETOROLAC TROMETHAMINE 30 MG/ML IJ SOLN
30.0000 mg | Freq: Once | INTRAMUSCULAR | Status: AC
  Administered 2014-09-14: 30 mg via INTRAVENOUS
  Filled 2014-09-13: qty 1

## 2014-09-13 MED ORDER — DIPHENHYDRAMINE HCL 50 MG/ML IJ SOLN
50.0000 mg | Freq: Once | INTRAMUSCULAR | Status: AC
  Administered 2014-09-13: 50 mg via INTRAVENOUS
  Filled 2014-09-13: qty 1

## 2014-09-13 MED ORDER — OXYCODONE HCL 5 MG PO TABS
10.0000 mg | ORAL_TABLET | ORAL | Status: DC | PRN
  Administered 2014-09-13 – 2014-09-14 (×3): 10 mg via ORAL
  Filled 2014-09-13 (×3): qty 2

## 2014-09-13 MED ORDER — DIPHENHYDRAMINE HCL 50 MG/ML IJ SOLN
25.0000 mg | Freq: Once | INTRAMUSCULAR | Status: AC
  Administered 2014-09-14: 25 mg via INTRAVENOUS
  Filled 2014-09-13: qty 1

## 2014-09-13 NOTE — Progress Notes (Signed)
Patient lying in bed resting quietly at this time. Heart rate 98 on monitor.

## 2014-09-13 NOTE — Progress Notes (Signed)
Dilaudid, phenergan, benadryle cocktail given at this time as ordered by Dr. Roderic Palau.

## 2014-09-13 NOTE — Progress Notes (Signed)
Patient sitting up in pain. Complaining of migraine improved, but stated she felt if we give one more cocktail for her migraine it would go away. Patient declined dose of oxycodone. Dr. Roderic Palau notified. Will repeat dose of phenergan, dilaudid and benadryl

## 2014-09-13 NOTE — Clinical Social Work Note (Signed)
CSW met with patient and provided Out Patient Psych/Counseling list at MD request.  Patient indicated that she is being seen at [facility] and that her next appointment is on 09/18/14. She stated that she has both a therapist and a psychiatrist with [facility]. Patient appreciative of information. No further needs. Will sign off.  Ambrose Pancoast, [place]

## 2014-09-13 NOTE — Progress Notes (Signed)
[facility] PROGRESS NOTE  Penny [name] UEA:[id number] DOB: [birth year] DOA: 09/12/2014 PCP: [name], MD  Assessment/Plan: 1. Pseudoseizures. 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 Patient did have another "spell" while she was on telemetry. She was not noted to be hypoxic/tachycardic and was able to talk this after an episode. She will be further followup with psychiatry which has been arranged and she has also been referred to [facility] neurology clinic. 2. Complicated migraine headaches. Continue to treat with pain medicines. She will need to follow up with a headache specialist. 3. Anxiety and depression. Continue outpatient psychotropics. 4. Possible UTI. Urine culture does show significant growth, but she was taking antibiotics prior to admission. Discussed with Dr. Emelda Fear and recommendations were for 7 days of ciprofloxacin. 5. Elevated liver function tests. She is status post cholecystectomy. Hepatitis panel was found to be negative. Right upper quadrant ultrasound did not show any bile duct dilatation, although she was noted to have some fatty liver. We'll check Tylenol level. Repeat labs in the morning. Continue IV fluid  Code Status: full code Family Communication:discussed with patient Disposition Plan: discharge home once improved   Consultants:    Procedures:  Dilatation and curettage by Dr. Emelda Fear 10/8  Antibiotics:  cipro 10/8  HPI/Subjective: Still having headache. Denies any abdominal pain, nausea  Objective: Filed Vitals:   09/13/14 0543  BP: 132/81  Pulse: 121  Temp: 99.7 F (37.6 C)  Resp: 22    Intake/Output Summary (Last 24 hours) at 09/13/14 1948 Last data filed at 09/13/14 1816  Gross per 24 hour  Intake   1990 ml  Output   4625 ml  Net  -2635 ml   Filed Weights   09/12/14 1605  Weight: 106.777 kg (235 lb 6.4 oz)    Exam:   General:  NAD  Cardiovascular: s1, s2, rrr  Respiratory: CTA B  Abdomen: soft, mild tenderness  in upper abdomen, bs+  Musculoskeletal: no edema b/l   Data Reviewed: Basic Metabolic Panel:  Recent Labs Lab 09/10/14 1355 09/12/14 1734 09/13/14 0638  NA 138 138 134*  K 4.2 4.2 4.1  CL 103 101 99  CO2 24 27 25   GLUCOSE 95 86 106*  BUN 12 7 4*  CREATININE 0.57 0.53 0.55  CALCIUM 8.8 8.7 8.6   Liver Function Tests:  Recent Labs Lab 09/10/14 1355 09/12/14 1734 09/13/14 0638  AST 42* 303* 418*  ALT 100* 143* 283*  ALKPHOS 179* 293* 453*  BILITOT 0.2* 0.5 0.5  PROT 6.1 6.2 6.2  ALBUMIN 3.0* 3.0* 2.8*   No results found for this basename: LIPASE, AMYLASE,  in the last 168 hours No results found for this basename: AMMONIA,  in the last 168 hours CBC:  Recent Labs Lab 09/10/14 1355 09/12/14 1734 09/13/14 0638  WBC 6.9 7.8 5.4  NEUTROABS 3.5  --   --   HGB 9.8* 9.0* 8.8*  HCT 30.4* 27.9* 27.0*  MCV 89.4 88.6 86.5  PLT 325 230 223   Cardiac Enzymes: No results found for this basename: CKTOTAL, CKMB, CKMBINDEX, TROPONINI,  in the last 168 hours BNP (last 3 results) No results found for this basename: PROBNP,  in the last 8760 hours CBG: No results found for this basename: GLUCAP,  in the last 168 hours  Recent Results (from the past 240 hour(s))  GC/CHLAMYDIA PROBE AMP     Status: None   Collection Time    09/09/14  3:11 PM      Result Value Ref Range Status  CT Probe RNA NEGATIVE   Final   GC Probe RNA NEGATIVE   Final   Comment:                                                                                             **Normal Reference Range: Negative**                 Assay performed using the Gen-Probe APTIMA COMBO2 (R) Assay.           Acceptable specimen types for this assay include APTIMA Swabs (Unisex,     endocervical, urethral, or vaginal), first void urine, and ThinPrep     liquid based cytology samples.  URINE CULTURE     Status: None   Collection Time    09/12/14  5:49 PM      Result Value Ref Range Status   Specimen Description  URINE, CLEAN CATCH   Final   Special Requests NONE   Final   Culture  Setup Time     Final   Value: 09/12/2014 23:25     Performed at [facility] Count     Final   Value: NO GROWTH     Performed at [facility]   Culture     Final   Value: NO GROWTH     Performed at [facility]   Report Status 09/13/2014 FINAL   Final     Studies: Portable Chest 1 View  09/12/2014   CLINICAL DATA:  D and C followed by headache, seizure-like activity, tachycardia and hypoxia. Patient 6 weeks postpartum.  EXAM: PORTABLE CHEST - 1 VIEW  COMPARISON:  08/21/2013 and 08/12/2013  FINDINGS: Lungs are hypoinflated with minimal bibasilar linear density likely atelectasis. No evidence of effusion or pneumothorax. Cardiomediastinal silhouette and remainder of the exam is unchanged.  IMPRESSION: Hypoinflation with mild bibasilar atelectasis.   Electronically Signed   By: Elberta Fortis M.D.   On: 09/12/2014 16:43   US Abdomen Limited Ruq  09/13/2014   CLINICAL DATA:  Elevated liver function tests.  EXAM: US ABDOMEN LIMITED - RIGHT UPPER QUADRANT  COMPARISON:  None.  FINDINGS: Gallbladder:  Status post cholecystectomy.  Common bile duct:  Diameter: 9.1 mm which most likely is due to patient's post cholecystectomy status. No intrahepatic biliary dilatation is noted.  Liver:  No focal abnormality is seen. Slightly increased echogenicity is noted suggesting diffuse hepatocellular disease or fatty infiltration.  IMPRESSION: Status post cholecystectomy. Slightly increased echogenicity is noted of hepatic parenchyma suggesting diffuse hepatocellular disease or fatty infiltration.   Electronically Signed   By: Roque Lias M.D.   On: 09/13/2014 10:42    Scheduled Meds: . ciprofloxacin  250 mg Oral BID  . clonazePAM  1 mg Oral BID  . nortriptyline  50 mg Oral BID  . PARoxetine  25 mg Oral Daily  . sodium chloride  3 mL Intravenous Q12H   Continuous Infusions: . 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 If 7PM-7AM, please contact night-coverage at [email or web address], password [facility] 09/13/2014, 7:48 PM  LOS: 1 day

## 2014-09-13 NOTE — Progress Notes (Signed)
Patient taken down via wheelchair for ultrasound. Morphine given before transportation for headache. Physician notified that patient does not feel morphine is helping. Order entered to increase morphine.

## 2014-09-14 LAB — HEPATIC FUNCTION PANEL
ALK PHOS: 375 U/L — AB (ref 39–117)
ALT: 176 U/L — ABNORMAL HIGH (ref 0–35)
AST: 110 U/L — ABNORMAL HIGH (ref 0–37)
Albumin: 2.6 g/dL — ABNORMAL LOW (ref 3.5–5.2)
Bilirubin, Direct: <0.2 mg/dL (ref 0.0–0.3)
TOTAL PROTEIN: 6 g/dL (ref 6.0–8.3)
Total Bilirubin: 0.3 mg/dL (ref 0.3–1.2)

## 2014-09-14 LAB — ACETAMINOPHEN LEVEL

## 2014-09-14 MED ORDER — HYDROMORPHONE HCL 1 MG/ML IJ SOLN
0.5000 mg | Freq: Once | INTRAMUSCULAR | Status: AC
  Administered 2014-09-14: 0.5 mg via INTRAVENOUS
  Filled 2014-09-14: qty 1

## 2014-09-14 MED ORDER — OXYCODONE HCL 10 MG PO TABS: 10.0000 mg | ORAL_TABLET | Freq: Four times a day (QID) | ORAL | Status: DC | PRN

## 2014-09-14 MED ORDER — KETOROLAC TROMETHAMINE 15 MG/ML IJ SOLN
15.0000 mg | Freq: Once | INTRAMUSCULAR | Status: AC
  Administered 2014-09-14: 15 mg via INTRAVENOUS
  Filled 2014-09-14: qty 1

## 2014-09-14 MED ORDER — METRONIDAZOLE 500 MG PO TABS: 500.0000 mg | ORAL_TABLET | Freq: Two times a day (BID) | ORAL | Status: AC

## 2014-09-14 MED ORDER — PROMETHAZINE HCL 25 MG PO TABS: 25.0000 mg | ORAL_TABLET | Freq: Four times a day (QID) | ORAL | Status: DC | PRN

## 2014-09-14 MED ORDER — POLYETHYLENE GLYCOL 3350 17 G PO PACK
17.0000 g | PACK | Freq: Every day | ORAL | Status: DC
  Administered 2014-09-14: 17 g via ORAL
  Filled 2014-09-14: qty 1

## 2014-09-14 NOTE — Progress Notes (Signed)
Patient is stable at this time, reviewed discharge instructions, prescriptions given for flaygyl, cipro, and oxycodone. Patient to be transported home with spouse.

## 2014-09-14 NOTE — Telephone Encounter (Signed)
FMLA was filled out. It is clear that the patient is having severe issues and will need intensive psychiatric intervention as well as neurology intervention it is also clear that she is unable to care for her child and unable to care for herself and her husband must help her out. If further information is necessary let me know

## 2014-09-14 NOTE — Progress Notes (Signed)
Pt requested another pain cocktail. Dr notified and prescribed Toradol/Benadril.  Pt refused them and stated that she was going to leave hospital walking if Dr would not prescribe Dilaudid.  Dr added Dilaudid  and pt was satisfied.  New IV site was given d/t burning and pain in previous site.

## 2014-09-14 NOTE — Discharge Summary (Signed)
Physician Discharge Summary  Penny [name]:[id number] DOB: 19-Apr-1986 DOA: 09/12/2014  PCP: [name], MD  Admit date: 09/12/2014 Discharge date: 09/14/2014  Time spent: 40 minutes  Recommendations for Outpatient Follow-up:  1. Follow up with primary care doctor in 1- 2 weeks 2. Follow up with behavioral health on 10/14 as previous scheduled 3. Follow up with neurology clinic at [facility] in 10/2014 4. Follow up with Dr. Emelda Fear in 2 weeks  Discharge Diagnoses:  Principal Problem:   Pseudoseizures Active Problems:   History of migraine headaches   Depression with anxiety   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   Chronic interstitial cystitis Elevated liver enzymes Fatty liver  Discharge Condition: improved  Diet recommendation: low fat  Filed Weights   09/12/14 1605  Weight: 106.777 kg (235 lb 6.4 oz)    History of present illness:  This 28 year old female who is postpartum 6 weeks presented to [facility] on 10/8 for elective dilation and curettage of retained uterine contents. Her procedure was uneventful. In the recovery room, patient began to have "spells" that were felt to be possible seizures. She was noted to be mildly tachycardic and had some mild hypoxia. Of course, she was also having significant pain as well as was still under the effects of anesthesia. Hospitalist service was asked to see the patient to evaluate for possible seizures and for overnight observation.  Hospital Course:  Patient was admitted to the hospital. She had 1-2 brief "spells", where she was not noted to be tachycardic or hypoxic. She does continue to complain of headache during the hospital stay and was given pain medications. Again her hospital stay, she reported that her headache had improved. She plans to followup with [facility] for further management for headaches. Have given her a short supply of narcotics and asked  her followup with her primary care physician until she is seen at [facility]. The patient also has followup scheduled with behavioral health on 10/14 to dress her postpartum depression/anxiety which is likely contributing significantly to her current presentations.  Patient was also found to have elevated liver function tests. Hepatitis panel was found to be negative and right upper quadrant ultrasound did not show any biliary tree dilation. She is already status post cholecystectomy. He did indicate underlying fatty liver which may be contributing to her elevated LFTs. She was hydrated with IV fluids and the following day her LFTs had improved. Would recommend repeat LFTs in approximately one week to ensure further improvement.  Patient placed on antibiotics for urinary tract/GYN tract infection. Followup with Dr. Emelda Fear in 2 weeks.  Procedures:    Consultations:    Discharge Exam: Filed Vitals:   09/14/14 0622  BP: 137/87  Pulse: 78  Temp: 98.9 F (37.2 C)  Resp: 20    General: NAD Cardiovascular: S1, S2 RRR Respiratory: CTA B  Discharge Instructions You were cared for by a hospitalist during your hospital stay. If you have any questions about your discharge medications or the care you received while you were in the hospital after you are discharged, you can call the unit and asked to speak with the hospitalist on call if the hospitalist that took care of you is not available. Once you are discharged, your primary care physician will handle any further medical issues. Please note that NO REFILLS for any discharge medications will be authorized once you are discharged, as it is imperative that you return to your primary care physician (  or establish a relationship with a primary care physician if you do not have one) for your aftercare needs so that they can reassess your need for medications and monitor your lab values.  Discharge Instructions   Call MD for:  persistant nausea and  vomiting    Complete by:  As directed      Call MD for:  severe uncontrolled pain    Complete by:  As directed      Call MD for:  severe uncontrolled pain    Complete by:  As directed      Call MD for:  temperature >100.4    Complete by:  As directed      Diet - low sodium heart healthy    Complete by:  As directed      Diet - low sodium heart healthy    Complete by:  As directed      Discharge instructions    Complete by:  As directed   Monitor temperature, notify the office for heavy bleeding greater than a period     Increase activity slowly    Complete by:  As directed      Increase activity slowly    Complete by:  As directed           Current Discharge Medication List    START taking these medications   Details  ciprofloxacin (CIPRO) 500 MG tablet Take 1 tablet (500 mg total) by mouth 2 (two) times daily. Qty: 14 tablet, Refills: 0    metroNIDAZOLE (FLAGYL) 500 MG tablet Take 1 tablet (500 mg total) by mouth 2 (two) times daily. Qty: 14 tablet, Refills: 0    oxyCODONE 10 MG TABS Take 1 tablet (10 mg total) by mouth every 6 (six) hours as needed for severe pain. Qty: 30 tablet, Refills: 0      CONTINUE these medications which have CHANGED   Details  promethazine (PHENERGAN) 25 MG tablet Take 1 tablet (25 mg total) by mouth every 6 (six) hours as needed for nausea or vomiting. Qty: 30 tablet, Refills: 0      CONTINUE these medications which have NOT CHANGED   Details  acetaminophen (TYLENOL) 500 MG tablet Take 1,000 mg by mouth every 6 (six) hours as needed for headache (pain).     Ascorbic Acid (VITAMIN C) 100 MG CHEW Chew 200 mg by mouth at bedtime.     BIOTIN PO Take 1 tablet by mouth at bedtime.     Cholecalciferol (VITAMIN D3) 2000 UNITS TABS Take 2,000 Units by mouth at bedtime.     clonazePAM (KLONOPIN) 1 MG tablet Take 1 tablet (1 mg total) by mouth 2 (two) times daily. As directed, caution drowsiness Qty: 60 tablet, Refills: 0    doxycycline  (VIBRAMYCIN) 100 MG capsule Take 1 capsule (100 mg total) by mouth 2 (two) times daily. Qty: 14 capsule, Refills: 0    lidocaine (XYLOCAINE JELLY) 2 % jelly Apply 1 application topically as needed. Qty: 30 mL, Refills: 2    nortriptyline (PAMELOR) 50 MG capsule Take 1 capsule (50 mg total) by mouth 2 (two) times daily. Qty: 60 capsule, Refills: 1    PARoxetine (PAXIL CR) 25 MG 24 hr tablet Take 1 tablet (25 mg total) by mouth daily. Qty: 30 tablet, Refills: 1    rizatriptan (MAXALT) 10 MG tablet Take 10 mg by mouth as needed for migraine. May repeat in 2 hours if needed    zolpidem (AMBIEN) 10 MG tablet Take 10 mg  by mouth at bedtime as needed for sleep.      STOP taking these medications     butalbital-acetaminophen-caffeine (FIORICET, ESGIC) 50-325-40 MG per tablet      HYDROcodone-acetaminophen (NORCO/VICODIN) 5-325 MG per tablet        Allergies  Allergen Reactions  . Gabapentin Other (See Comments)    Pt states that she was unresponsive after taking this medication, but her vitals remained stable.    . Metoclopramide Hcl Anxiety and Other (See Comments)    Pt states that she felt like she was trapped in a box, and could not get out.  Pt also states that she had temporary loss of movement, weakness, and tingling.    . Topamax [Topiramate] Shortness Of Breath and Other (See Comments)    Adverse side effects.  Pt states this medication changed her taste for a short period of time and caused numbness and tingling in extremities.    . Codeine Nausea And Vomiting  . Latex Rash  . Tape Itching and Rash   Follow-up Information   Follow up with Tilda Burrow, MD. (For wound re-check)    Specialties:  Obstetrics and Gynecology, Radiology   Contact information:   [address] [place] [address] [phone]        The results of significant diagnostics from this hospitalization (including imaging, microbiology, ancillary and laboratory) are listed below for  reference.    Significant Diagnostic Studies: Ct Head Wo Contrast  09/03/2014   CLINICAL DATA:  Left-sided migraine. Seizures. Blurred vision. Four weeks postpartum.  EXAM: CT HEAD WITHOUT CONTRAST  TECHNIQUE: Contiguous axial images were obtained from the base of the skull through the vertex without intravenous contrast.  COMPARISON:  Brain MR of 08/14/2014.  CT of the head of 08/13/2014.  FINDINGS: Sinuses/Soft tissues: Clear paranasal sinuses and mastoid air cells.  Intracranial: Mild motion degradation. These portions were repeated.  No mass lesion, hemorrhage, hydrocephalus, acute infarct, intra-axial, or extra-axial fluid collection.  IMPRESSION: Normal head CT.   Electronically Signed   By: Jeronimo Greaves M.D.   On: 09/03/2014 18:05   US Transvaginal Non-ob  09/08/2014   GYNECOLOGIC SONOGRAM   Avacyn Bjorge Stalker is a 28 y.o. [id number] for a pelvic sonogram for pelvic pain  since vaginal delivery on 07/29/2014.  Uterus                      10.3 x 6.0  cm, anteverted  Endometrium          *hyperchoic appearance = 3.0 x 2.4cm ?Retained  Products  Right ovary             2.8 x 2.0 x 1.7 cm,   Left ovary                3.5 x 2.9 x 1.6 cm,   No free fluid or adnexal masses noted within the pelvis  Technician Comments:  Anteverted uterus noted with thickened hyperechoic endometrial cavity with  ?retained products, cx appears closed, bilateral adnexa appears WNL no  free fluid or adnexal masses noted within the pelvis   Chari Manning 09/06/2014 11:48 AM  Slight retained tissue in endometrial cavity, Pt unwilling or unable to  consent to Cytotec trial. Will proceed to hysteroscopy, with Dilation and Curettage this week. normal adnexal structures. No pelvic pathology.   Ct Abdomen Pelvis W Contrast  09/03/2014   CLINICAL DATA:  Left-sided migraine headache with blurred vision, nausea, right lower quadrant  pain and fever. History of gastric bypass in 2007. Patient is 4 weeks postpartum.  EXAM: CT ABDOMEN AND PELVIS  WITH CONTRAST  TECHNIQUE: Multidetector CT imaging of the abdomen and pelvis was performed using the standard protocol following bolus administration of intravenous contrast.  CONTRAST:  50mL OMNIPAQUE IOHEXOL 300 MG/ML SOLN, OMNIPAQUE IOHEXOL 300 MG/ML SOLN  COMPARISON:  Lumbar MRI 07/30/2014.  Abdominal pelvic CT 11/13/2012.  FINDINGS: Lung bases: Minimal bibasilar atelectasis. No pleural or pericardial effusion.  Liver/Biliary/Pancreas: The liver is normal in density without focal abnormality. There is stable mild prominence of the extrahepatic biliary system status post cholecystectomy. The pancreas appears normal.  Spleen/Adrenal glands: Unremarkable.  Kidneys/Ureters/Bladder: Both kidneys appear normal. There is no hydronephrosis or evidence of urinary tract calculus. The bladder appears normal.  Bowel/Peritoneum: There are stable postsurgical changes status post gastric bypass. There is no small bowel distension or wall thickening. There is prominent stool throughout the colon. The appendix is probably visualized in the central abdomen, best seen on coronal image 41. No pelvic inflammatory changes are seen. No ascites or peritoneal nodularity.  Retroperitoneum/Pelvis: There are no enlarged abdominal or pelvic lymph nodes. There are mildly prominent mesenteric lymph nodes which are probably reactive. No significant vascular findings are present. The postpartum uterus has decreased in size compared with the prior lumbar MRI. The endometrial cavity remains distended with heterogeneous material, some of which demonstrates increased density. There is no adnexal mass.  Abdominal wall: No abdominal wall masses or hernias.  Musculoskeletal: No acute or significant osseus findings.  IMPRESSION: 1. Prominent stool throughout the colon suggesting constipation. No evidence of bowel obstruction or appendicitis. 2. There is high-density material within the distended endometrial cavity status post recent childbirth.  Findings could be secondary to retained products of conception. Correlation with beta HCG levels and consideration of follow-up pelvic ultrasound suggested.   Electronically Signed   By: Roxy Horseman M.D.   On: 09/03/2014 18:14   Portable Chest 1 View  09/12/2014   CLINICAL DATA:  D and C followed by headache, seizure-like activity, tachycardia and hypoxia. Patient 6 weeks postpartum.  EXAM: PORTABLE CHEST - 1 VIEW  COMPARISON:  08/21/2013 and 08/12/2013  FINDINGS: Lungs are hypoinflated with minimal bibasilar linear density likely atelectasis. No evidence of effusion or pneumothorax. Cardiomediastinal silhouette and remainder of the exam is unchanged.  IMPRESSION: Hypoinflation with mild bibasilar atelectasis.   Electronically Signed   By: Elberta Fortis M.D.   On: 09/12/2014 16:43   US Abdomen Limited Ruq  09/13/2014   CLINICAL DATA:  Elevated liver function tests.  EXAM: US ABDOMEN LIMITED - RIGHT UPPER QUADRANT  COMPARISON:  None.  FINDINGS: Gallbladder:  Status post cholecystectomy.  Common bile duct:  Diameter: 9.1 mm which most likely is due to patient's post cholecystectomy status. No intrahepatic biliary dilatation is noted.  Liver:  No focal abnormality is seen. Slightly increased echogenicity is noted suggesting diffuse hepatocellular disease or fatty infiltration.  IMPRESSION: Status post cholecystectomy. Slightly increased echogenicity is noted of hepatic parenchyma suggesting diffuse hepatocellular disease or fatty infiltration.   Electronically Signed   By: Roque Lias M.D.   On: 09/13/2014 10:42    Microbiology: Recent Results (from the past 240 hour(s))  GC/CHLAMYDIA PROBE AMP     Status: None   Collection Time    09/09/14  3:11 PM      Result Value Ref Range Status   CT Probe RNA NEGATIVE   Final   GC Probe RNA NEGATIVE  Final   Comment:                                                                                             **Normal Reference Range: Negative**                  Assay performed using the Gen-Probe APTIMA COMBO2 (R) Assay.           Acceptable specimen types for this assay include APTIMA Swabs (Unisex,     endocervical, urethral, or vaginal), first void urine, and ThinPrep     liquid based cytology samples.  URINE CULTURE     Status: None   Collection Time    09/12/14  5:49 PM      Result Value Ref Range Status   Specimen Description URINE, CLEAN CATCH   Final   Special Requests NONE   Final   Culture  Setup Time     Final   Value: 09/12/2014 23:25     Performed at [facility] Count     Final   Value: NO GROWTH     Performed at [facility]   Culture     Final   Value: NO GROWTH     Performed at [facility]   Report Status 09/13/2014 FINAL   Final     Labs: Basic Metabolic Panel:  Recent Labs Lab 09/10/14 1355 09/12/14 1734 09/13/14 0638  NA 138 138 134*  K 4.2 4.2 4.1  CL 103 101 99  CO2 24 27 25   GLUCOSE 95 86 106*  BUN 12 7 4*  CREATININE 0.57 0.53 0.55  CALCIUM 8.8 8.7 8.6   Liver Function Tests:  Recent Labs Lab 09/10/14 1355 09/12/14 1734 09/13/14 0638 09/14/14 0601  AST 42* 303* 418* 110*  ALT 100* 143* 283* 176*  ALKPHOS 179* 293* 453* 375*  BILITOT 0.2* 0.5 0.5 0.3  PROT 6.1 6.2 6.2 6.0  ALBUMIN 3.0* 3.0* 2.8* 2.6*   No results found for this basename: LIPASE, AMYLASE,  in the last 168 hours No results found for this basename: AMMONIA,  in the last 168 hours CBC:  Recent Labs Lab 09/10/14 1355 09/12/14 1734 09/13/14 0638  WBC 6.9 7.8 5.4  NEUTROABS 3.5  --   --   HGB 9.8* 9.0* 8.8*  HCT 30.4* 27.9* 27.0*  MCV 89.4 88.6 86.5  PLT 325 230 223   Cardiac Enzymes: No results found for this basename: CKTOTAL, CKMB, CKMBINDEX, TROPONINI,  in the last 168 hours BNP: BNP (last 3 results) No results found for this basename: PROBNP,  in the last 8760 hours CBG: No results found for this basename: GLUCAP,  in the last 168  hours     Signed:  Ever Halberg  [facility] 09/14/2014, 3:46 PM

## 2014-09-16 ENCOUNTER — Ambulatory Visit: Payer: Medicaid Other | Admitting: Obstetrics and Gynecology

## 2014-09-16 IMAGING — MR MR CERVICAL SPINE W/O CM
4 of 5 series · 19 of 48 positions shown · non-contrast
Comparison: None.

CLINICAL DATA: Right-sided neck pain.  Neck pain radiating to both
arms.

MRI CERVICAL SPINE WITHOUT CONTRAST
TECHNIQUE: Multiplanar and multiecho pulse sequences of the
cervical spine, to include the craniocervical junction and
cervicothoracic junction, were obtained according to standard
protocol without intravenous contrast.

[Series 3: T2 · sagittal · 3.0mm · 0.37mm/px · 6 of 13 slices shown (1 of 2)]
[im 1/13]
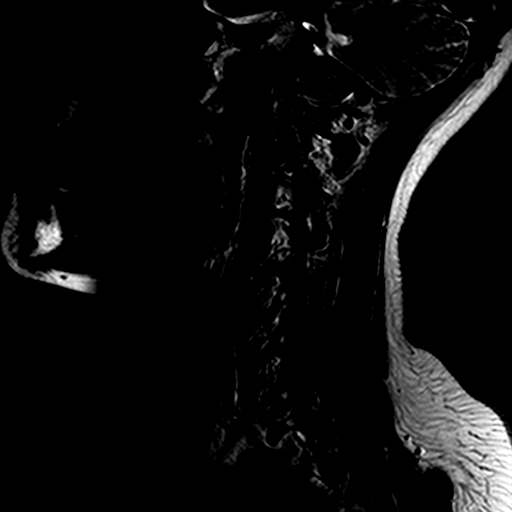
[im 3/13]
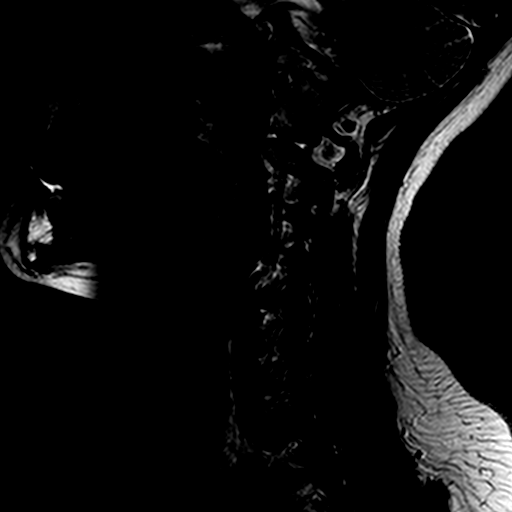
[im 5/13]
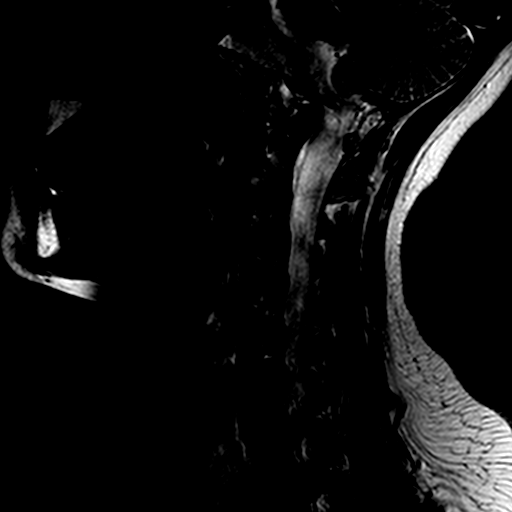
[im 8/13]
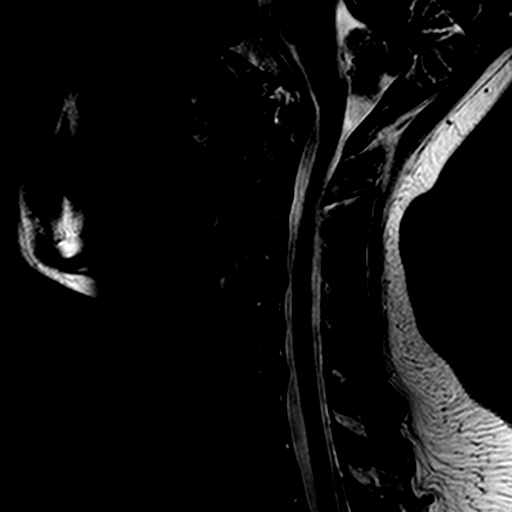
[im 10/13]
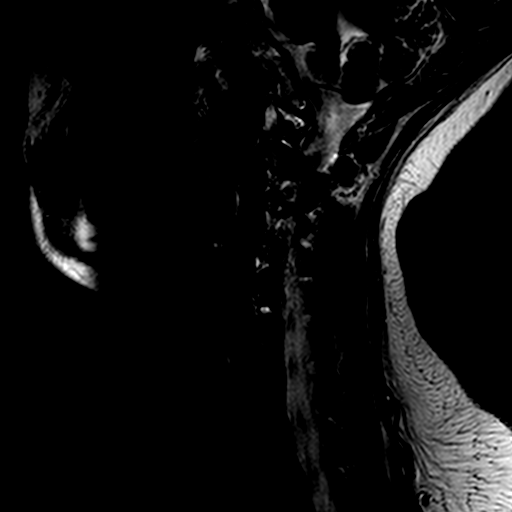
[im 13/13]
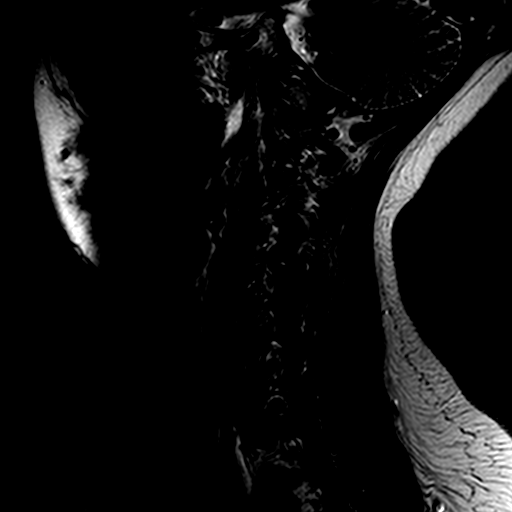

[Series 4: FLAIR · sagittal · 3.0mm · 0.37mm/px · 3 of 13 slices shown]
[im 3/13]
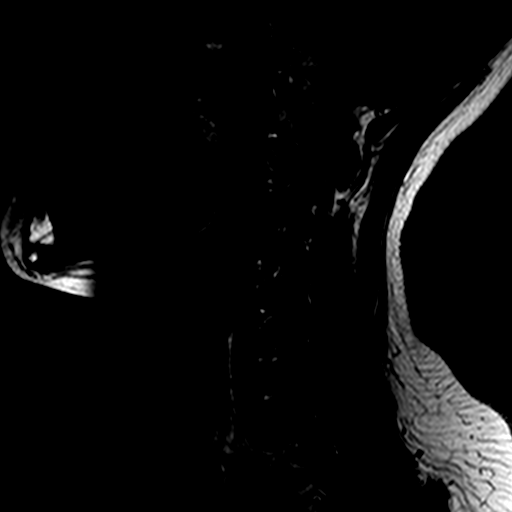
[im 8/13]
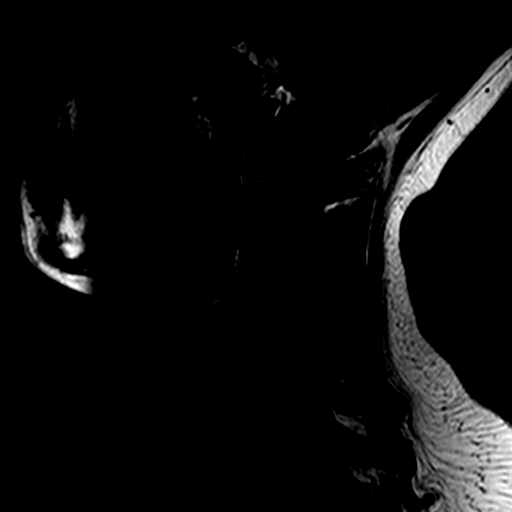
[im 13/13]
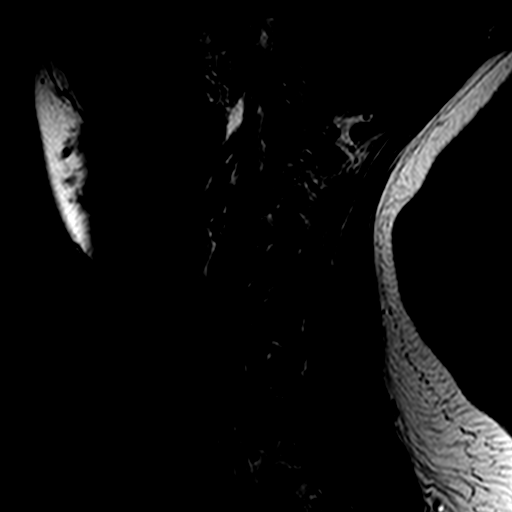

[Series 5: ir sag · sagittal · 3.0mm · 0.37mm/px · 3 of 13 slices shown]
[im 3/13]
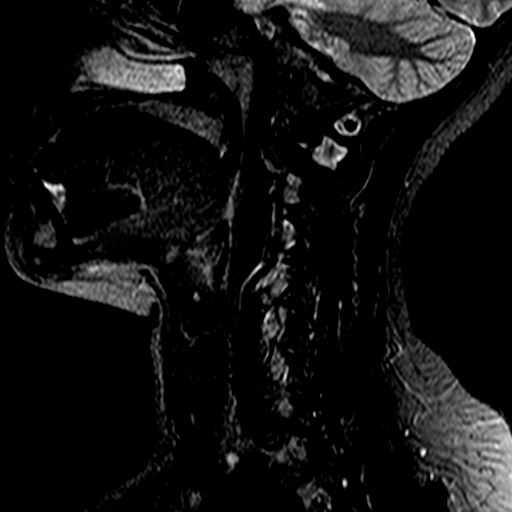
[im 8/13]
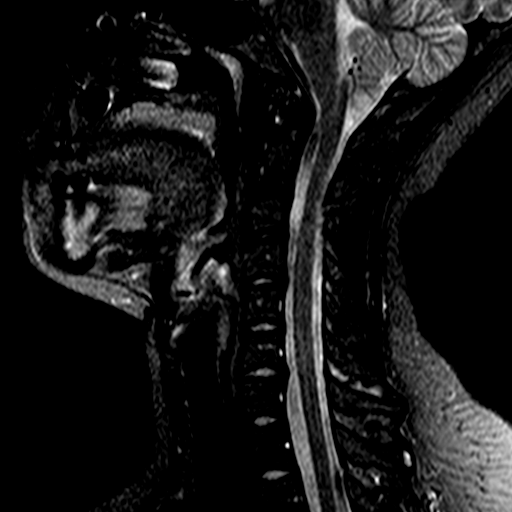
[im 13/13]
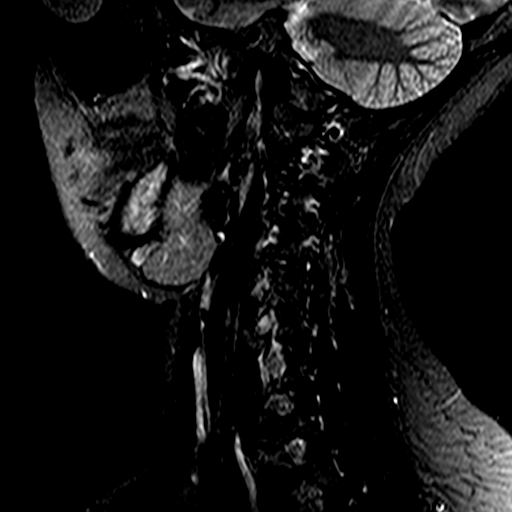

[Series 6: T2 · axial · 3.0mm · 0.21mm/px · z∈[-65,+16]mm · 7 of 34 slices shown (2 of 2)]
[im 1/34]
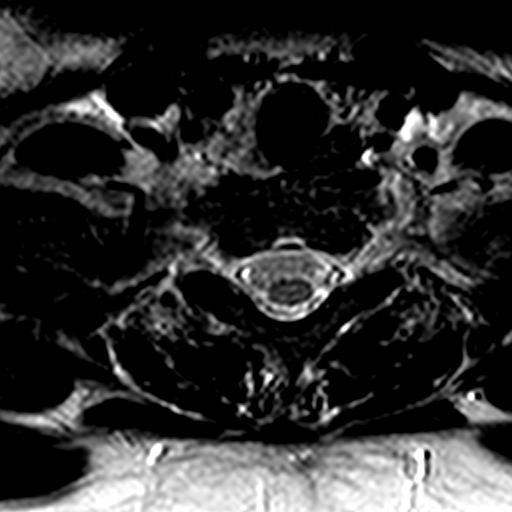
[im 5/34]
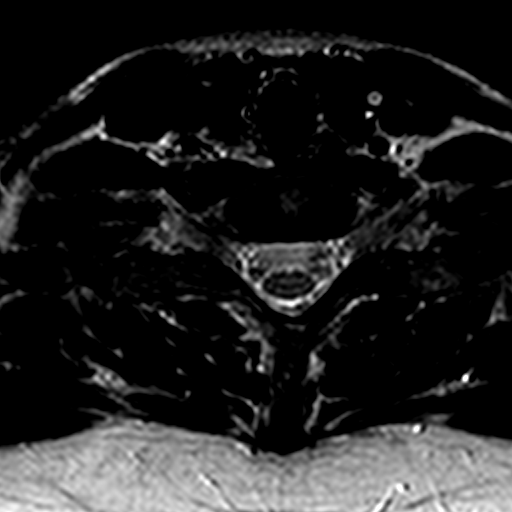
[im 10/34]
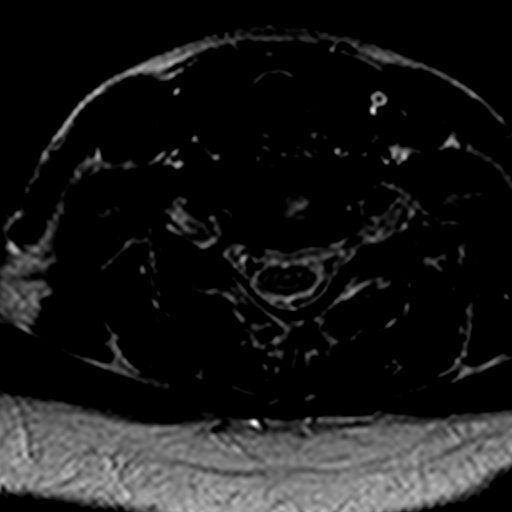
[im 15/34]
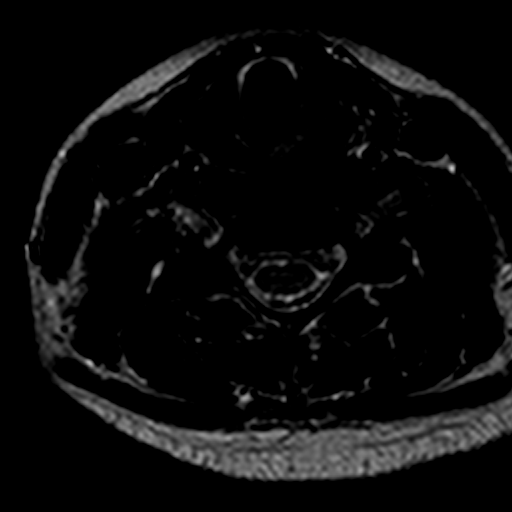
[im 17/34]
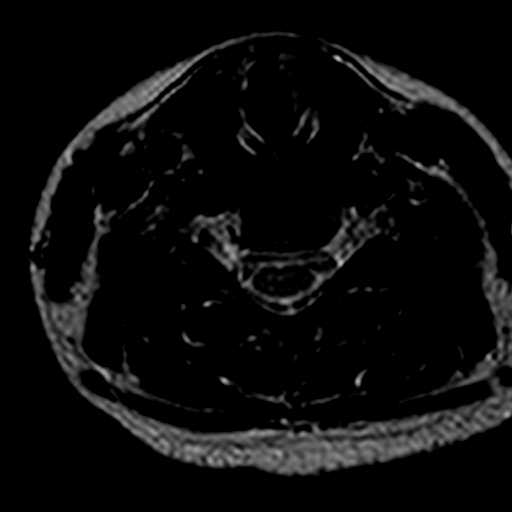
[im 19/34]
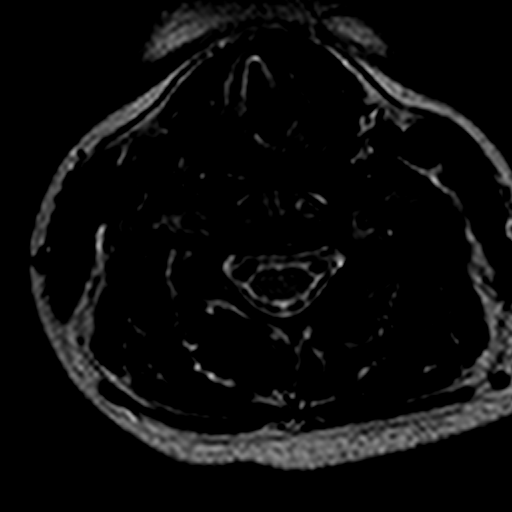
[im 29/34]
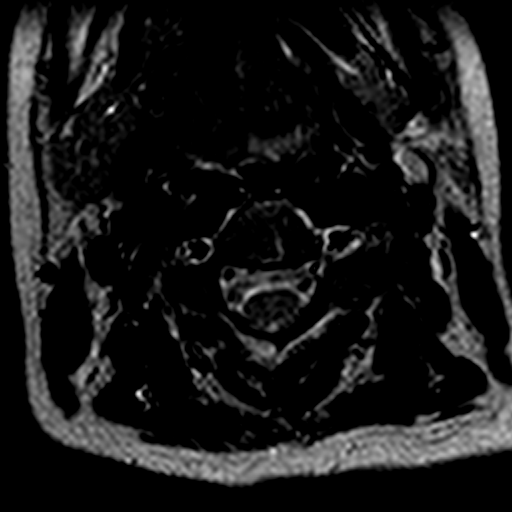

[19 of 48 positions shown; findings below may reference images not displayed]

FINDINGS: Anatomic alignment of the cervical spine.  Cervical cord
demonstrates normal signal.  No edema.  Flow voids are present in
both vertebral arteries.  The paraspinal soft tissues are normal.
Visualized posterior fossa structures are normal.

C2-C3:  Negative.

C3-C4:  Negative.

C4-C5:  Tiny left paracentral disc protrusion without stenosis.
Foramina patent.

C5-C6:  Minimal disc bulge without stenosis.

C6-C7:  Tiny central disc protrusion without stenosis.

C7-T1:  Negative.
IMPRESSION: Mild mid to lower cervical degenerative disc disease with tiny
protrusions.  No resulting stenosis.

## 2014-09-18 ENCOUNTER — Other Ambulatory Visit: Payer: Self-pay | Admitting: *Deleted

## 2014-09-18 ENCOUNTER — Telehealth: Payer: Self-pay | Admitting: Family Medicine

## 2014-09-18 ENCOUNTER — Telehealth: Payer: Self-pay | Admitting: *Deleted

## 2014-09-18 MED ORDER — FLUCONAZOLE 150 MG PO TABS: ORAL_TABLET | ORAL | Status: DC

## 2014-09-18 NOTE — Telephone Encounter (Signed)
After looking into her chart the pt had spoken to a nurse at her PCP not long before calling our office, they gave the pt diflucan, there was nothing in the note about needing a muscle relaxer. She was also supposed to have an appointment at [facility] today.

## 2014-09-18 NOTE — Telephone Encounter (Signed)
Please refer to primary care. Thank you .

## 2014-09-18 NOTE — Telephone Encounter (Signed)
Before calling the pt I spoke with Dr.Ferguson about the pt wanting muscle relaxer and he advised that the pt would need to go see her PCP for this Rx. I called the pt and informed her of Dr. Johnnye Sima decision, the pt asked if he could just give her enough to get her through the weekend until she could see Dr. Wolfgang Phoenix, I advised the pt that Dr. Glo Herring was out of the office at the time and would not be back in the office until Friday. Then pt then asked if I could ask Dr. Elonda Husky, he had already left for the day as well. The pt then asked if I could ask Anderson Malta, I advised the pt that she would probably not do it but I did ask anyway and she advised that she wouldn't be able to. I informed the pt of this and advised her to call her PCP in the morning and see if one of the other providers there could give her the Rx. The pt stuttered during the whole conversation, the pt stated that she had some type of test tomorrow and then she was supposed too follow up with Dr. Wolfgang Phoenix. I asked the pt what day her appointment was next week and she stated that "they just told me they would get me in to see him". The pt asked multiple times for just enough medication to get her through the weekend. The pt stated " I really don't want to have to go back to the ER". The pt was advised again to contact her PCP and see if they would give her the Rx.

## 2014-09-18 NOTE — Telephone Encounter (Signed)
White vaginal discharge and itching. Diflucan 150mg  #2 one po 3 days apart sent to [facility].  Dr. Nicki Reaper please see message below about follow up appt.

## 2014-09-18 NOTE — Telephone Encounter (Signed)
Thanks, Tuesday or weds next week or the following week

## 2014-09-18 NOTE — Telephone Encounter (Signed)
Patient just got out of [facility] and wanting a followup visit. Please read discharge summary, they want her to followup with primary care in 2 weeks. Need to know where to put her on schedule.Also she need prescription for diflucan called into [facility]

## 2014-09-19 ENCOUNTER — Telehealth: Payer: Self-pay | Admitting: Family Medicine

## 2014-09-19 NOTE — Telephone Encounter (Signed)
Please send the last 3 notes

## 2014-09-19 NOTE — Telephone Encounter (Signed)
I do not feel I should be seeing her for non-gyn concerns. She likes having multiple providers too much.  Pt had an appt Monday with me that she Did NOT keep, so I will see her again for GYN care, even Friday of this week, but she needs to keep appts.

## 2014-09-19 NOTE — Telephone Encounter (Signed)
[facility] sent over a fax on patient needing all her records by tomorrow's appointment.I explained to them I needed a 24hr notice for records but they just wanted recent notes on patient dealing with her current sickness.They are looking for the last 3 office visits.

## 2014-09-20 ENCOUNTER — Ambulatory Visit: Payer: Medicaid Other | Admitting: Obstetrics & Gynecology

## 2014-09-24 ENCOUNTER — Ambulatory Visit (INDEPENDENT_AMBULATORY_CARE_PROVIDER_SITE_OTHER): Payer: Medicaid Other | Admitting: Family Medicine

## 2014-09-24 ENCOUNTER — Encounter: Payer: Self-pay | Admitting: Family Medicine

## 2014-09-24 VITALS — BP 128/80 | Ht 66.0 in | Wt 230.0 lb

## 2014-09-24 DIAGNOSIS — R740 Nonspecific elevation of levels of transaminase and lactic acid dehydrogenase [LDH]: Secondary | ICD-10-CM

## 2014-09-24 DIAGNOSIS — R569 Unspecified convulsions: Secondary | ICD-10-CM

## 2014-09-24 DIAGNOSIS — R1011 Right upper quadrant pain: Secondary | ICD-10-CM

## 2014-09-24 DIAGNOSIS — R7401 Elevation of levels of liver transaminase levels: Secondary | ICD-10-CM

## 2014-09-24 DIAGNOSIS — F445 Conversion disorder with seizures or convulsions: Secondary | ICD-10-CM

## 2014-09-24 MED ORDER — ALPRAZOLAM 0.5 MG PO TABS: 0.5000 mg | ORAL_TABLET | Freq: Three times a day (TID) | ORAL | Status: DC | PRN

## 2014-09-24 NOTE — Progress Notes (Signed)
   Subjective:    Patient ID: Penny [name], female    DOB: 01/09/1986, 28 y.o.   MRN: [id number]  HPI Patient is here today for a f/u on behavioral health.  Patient is not talking, but husband said that behavorial health could not refill any of her meds without seeing Dr. Nicki Reaper first.   Family states that the main reason they were sent here was to get a medical overview before going back to psychiatry. Patient was recently in the hospital with pelvic surgery./D&C that went well. Did have a pseudoseizure while in the hospital but then was released  This patient had extensive amount of hospitalization for seizures and was evaluated both in [facility] and at [place] and was diagnosed with pseudoseizures. She had a couple of events while on continuous monitoring that did not show any changes with the EEG during the events therefore she was diagnosed with pseudoseizures.  Review of Systems Patient depressed tearful under the care of her husband. No more pseudoseizures.    Objective:   Physical Exam  Lungs clear heart regular abdomen slight tenderness from previous surgery extremities no edema      Assessment & Plan:  Symptoms severe depression needs for ongoing counseling she also has a poor phobia as well as panic attacks. I recommend that we pursue forward with continued counseling and psychiatric care we will send a copy of the lab results as well as this dictation to her psychiatrist.  She does have a history of elevated transaminases we will recheck that  She has had gastric bypass in the past this seems to be doing okay.  Currently not suicidal or homicidal no pseudoseizures  Panic attacks Xanax 3 times a day when necessary 0.5 mg #90 given husband will dispense medication as needed continue antidepressant I have advised family it would be best for the psychiatrist to prescribe all of her medicines.

## 2014-10-03 ENCOUNTER — Ambulatory Visit (INDEPENDENT_AMBULATORY_CARE_PROVIDER_SITE_OTHER): Payer: Medicaid Other | Admitting: Obstetrics and Gynecology

## 2014-10-03 ENCOUNTER — Encounter: Payer: Self-pay | Admitting: Obstetrics and Gynecology

## 2014-10-03 VITALS — BP 120/70 | Ht 66.0 in | Wt 244.0 lb

## 2014-10-03 DIAGNOSIS — N898 Other specified noninflammatory disorders of vagina: Secondary | ICD-10-CM

## 2014-10-03 DIAGNOSIS — Z0289 Encounter for other administrative examinations: Secondary | ICD-10-CM

## 2014-10-03 DIAGNOSIS — F419 Anxiety disorder, unspecified: Secondary | ICD-10-CM

## 2014-10-03 IMAGING — CT CT HEAD W/O CM
1 of 2 series · 16 of 30 positions shown, 20 images · non-contrast
Comparison: Prior MRI from 07/31/2014

CLINICAL DATA: Loss of consciousness

EXAM:
CT HEAD WITHOUT CONTRAST
TECHNIQUE: Contiguous axial images were obtained from the base of the skull
through the vertex without intravenous contrast.

[Series 2: headseq 4.8 h37s · axial · 0.46mm/px · z∈[+86,+234]mm · 16 of 36 slices shown, 20 images]
[im 3/36  brain]
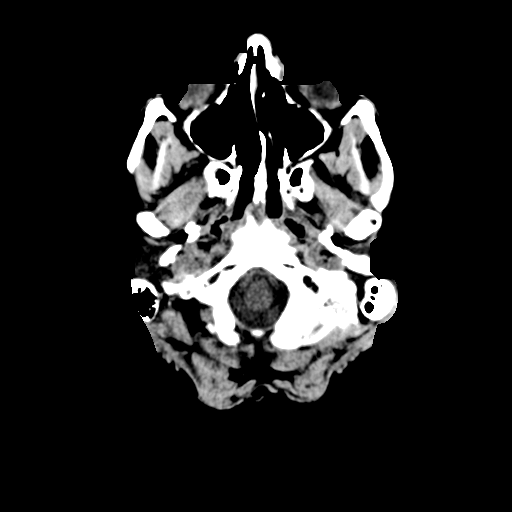
[im 3/36  bone]
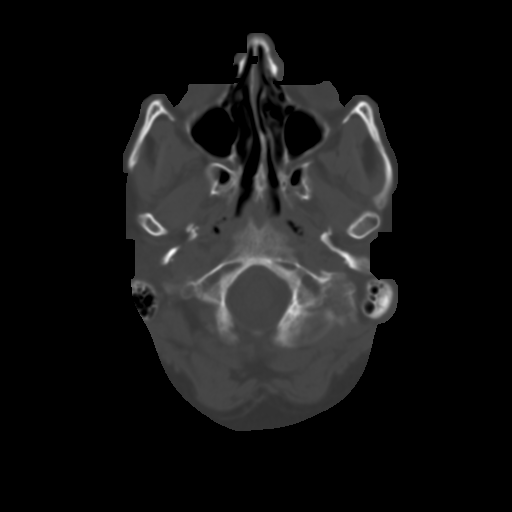
[im 5/36  brain]
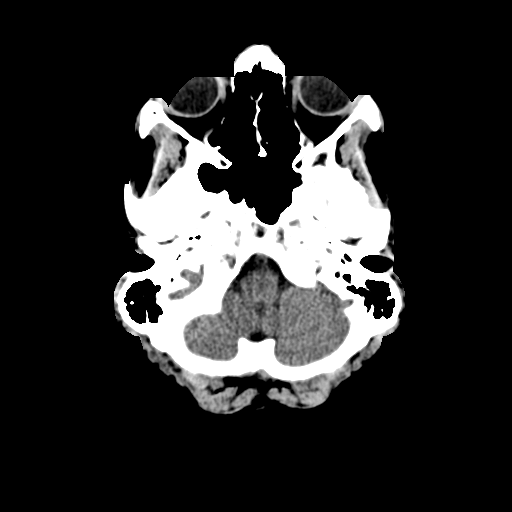
[im 7/36  brain]
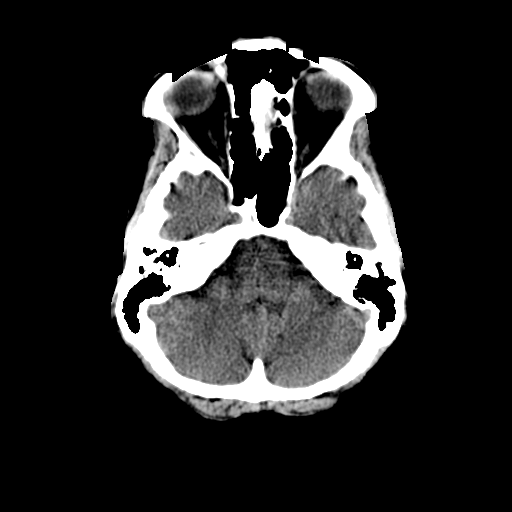
[im 9/36  brain]
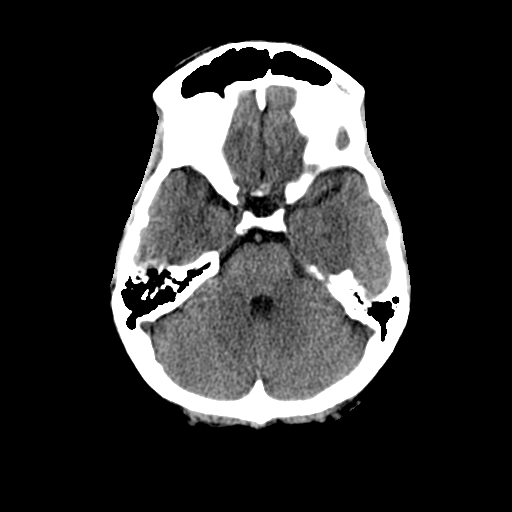
[im 11/36  brain]
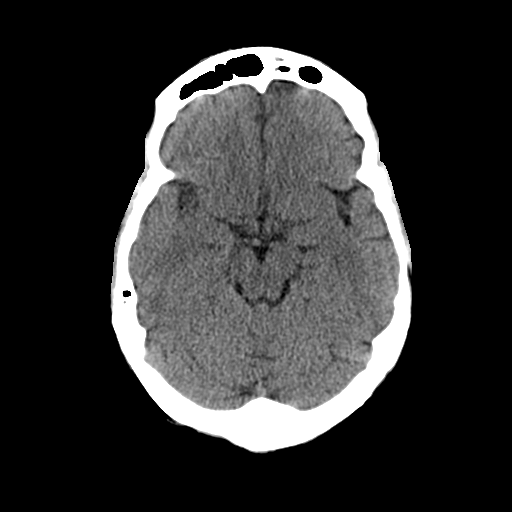
[im 11/36  bone]
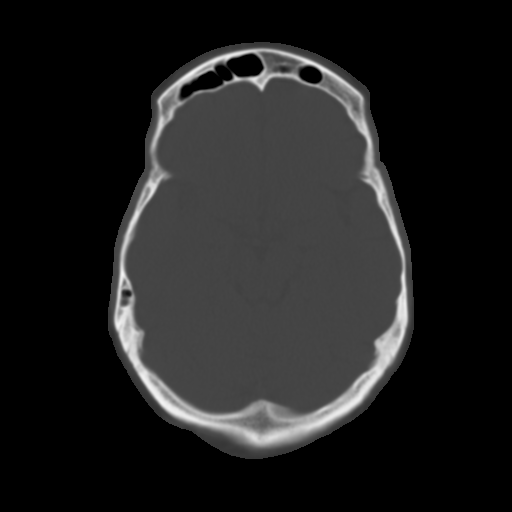
[im 13/36  brain]
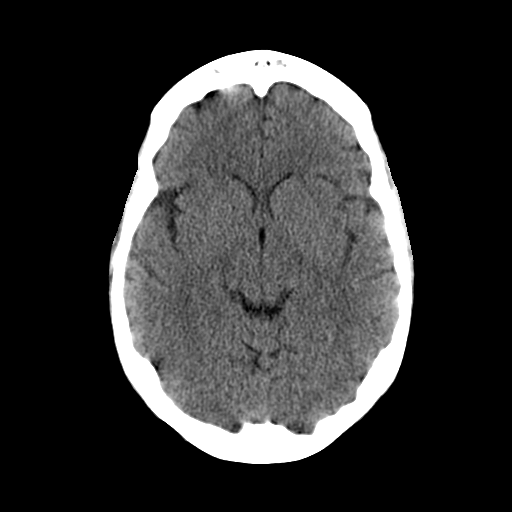
[im 15/36  brain]
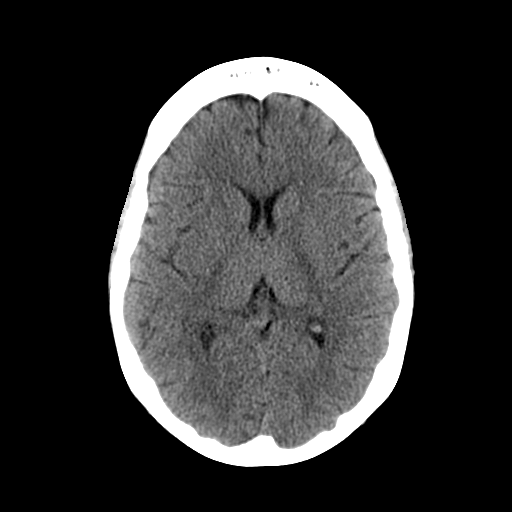
[im 17/36  brain]
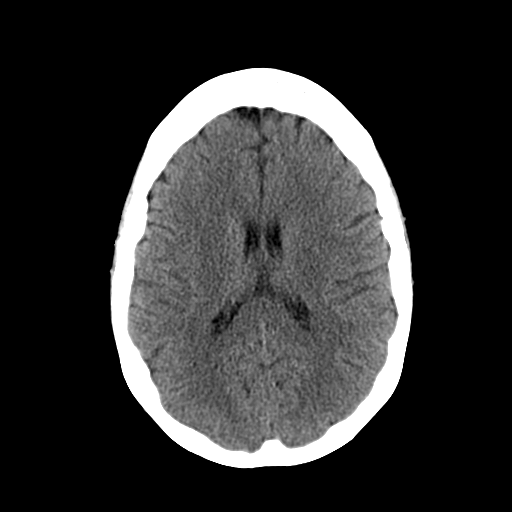
[im 19/36  brain]
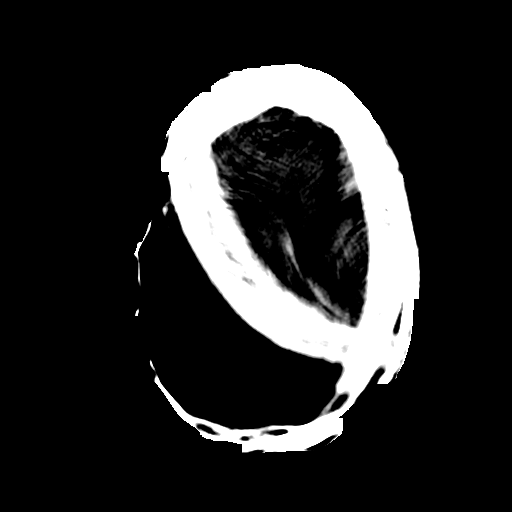
[im 19/36  bone]
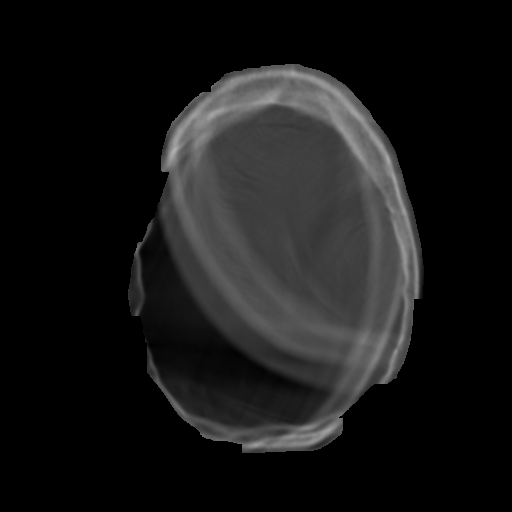
[im 21/36  brain]
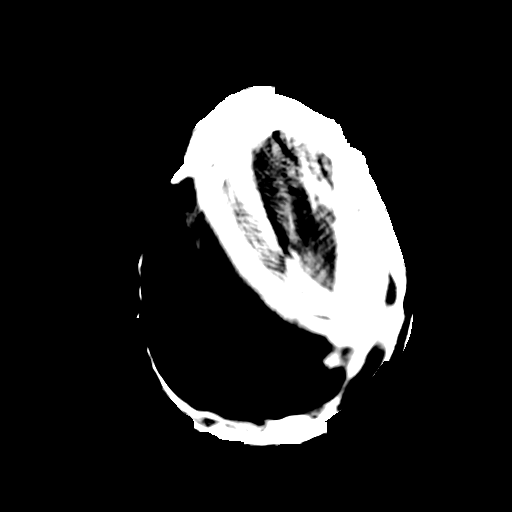
[im 23/36  brain]
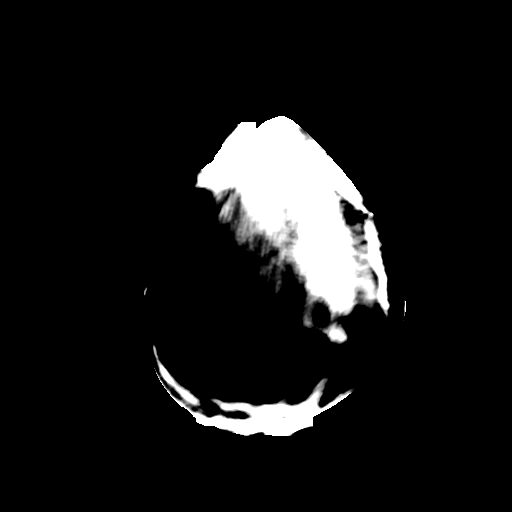
[im 25/36  brain]
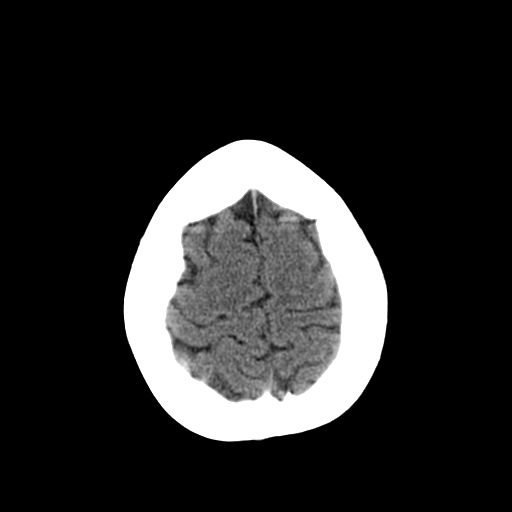
[im 27/36  brain]
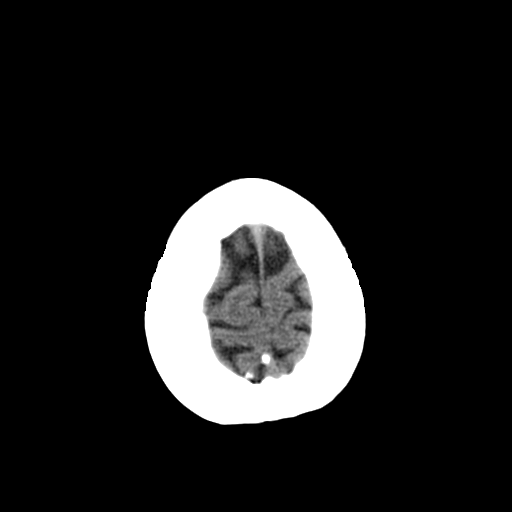
[im 27/36  bone]
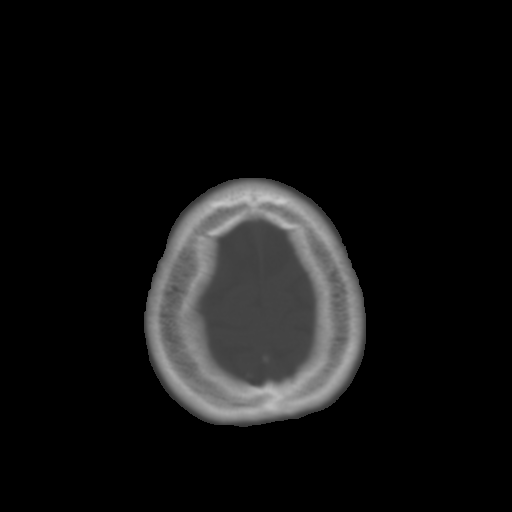
[im 29/36  brain]
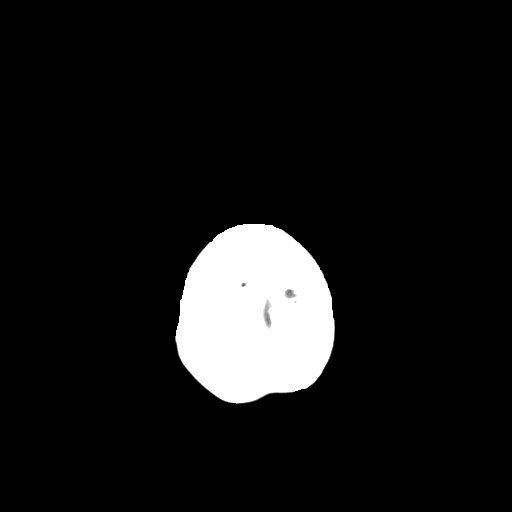
[im 31/36  brain]
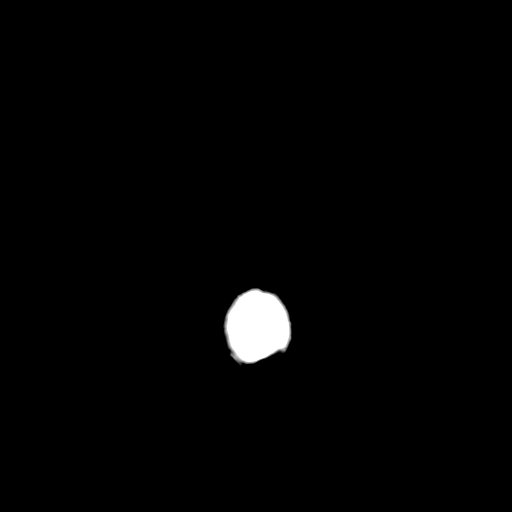
[im 33/36  brain]
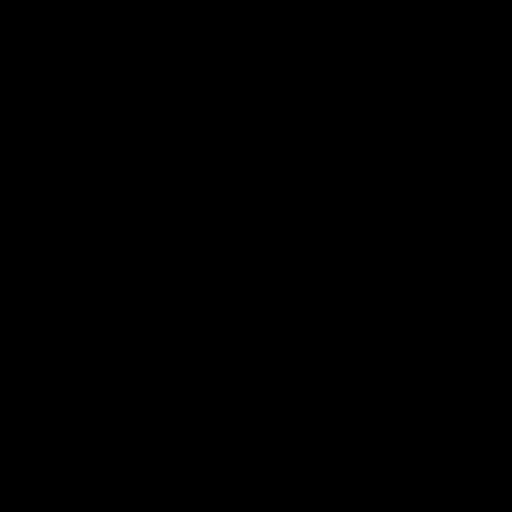

[16 of 30 positions shown; findings below may reference images not displayed]

FINDINGS: There is no acute intracranial hemorrhage or infarct. No mass lesion
or midline shift. Gray-white matter differentiation is well
maintained. Ventricles are normal in size without evidence of
hydrocephalus. CSF containing spaces are within normal limits. No
extra-axial fluid collection.

The calvarium is intact.

Orbital soft tissues are within normal limits.

Minimal opacity noted within the partially visualized right
maxillary sinus. Otherwise, the paranasal sinuses and mastoid air
cells are well pneumatized and free of fluid.

Scalp soft tissues are unremarkable.
IMPRESSION: Negative head CT.  No acute intracranial abnormality identified.

## 2014-10-03 MED ORDER — PREGABALIN 50 MG PO CAPS: 50.0000 mg | ORAL_CAPSULE | Freq: Two times a day (BID) | ORAL | Status: DC

## 2014-10-03 MED ORDER — HYDROCHLOROTHIAZIDE 25 MG PO TABS: 25.0000 mg | ORAL_TABLET | Freq: Every day | ORAL | Status: DC

## 2014-10-03 NOTE — Progress Notes (Addendum)
Patient ID: Penny [name], female   DOB: 1986/04/27, 27 y.o.   MRN: [id number]   [facility] Visit  Patient name: Penny [name] MRN [id number]  Date of birth: Jul 25, 1986  CC & HPI:  Penny [name] is a 28 y.o. female presenting today complaining of vaginal discharge with associated odor and vaginal pain that started 4 days ago.  She rates her vaginal pain at an 8/10.  She states that she has gain 10 pounds in the last week and has noticed swelling in her feet.  She experienced these symptoms before she had her D&C.  She is sexually active with her current partner and they use condoms as a form of contraception.  She has not had a period yet and she plans on getting an IUD inserted as a form of contraception.  Her newborn is 24 wks old and she is not currently breast feeding.    ROS:  All systems have been reviewed and are negative unless otherwise indicated in the HPI.   Pertinent History Reviewed:   Reviewed: Significant for  Medical         Past Medical History  Diagnosis Date  . Migraines   . Interstitial cystitis   . IUD FEB 2010  . Gastritis JULY 2011  . Depression   . 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 Fatty liver   . Obesity (BMI 30-39.9) 2011 228 LBS BMI 36.8  . Iron deficiency anemia 07/23/2010  . Irritable bowel syndrome 2012 DIARRHEA    JUN 2012 TTG IgA 14.9  . Elevated liver enzymes JUL 2011ALK PHOS 111-127 AST  143-267 ALT  213-321T BILI 0.6  ALB  3.7-4.06 Jun 2011 ALK PHOS 118 AST 24 ALT 42 T BILI 0.4 ALB 3.9  . Chronic daily headache   . Ovarian cyst   . PONV (postoperative nausea and vomiting)   . ADD (attention deficit disorder)   . RLQ abdominal pain 07/18/2013  . Menorrhagia 07/18/2013  . Anxiety   . Potassium (K) deficiency   . Dysmenorrhea 09/18/2013  . Stress 09/18/2013  . Patient desires pregnancy 09/18/2013  . Pregnant 12/25/2013  . Blood transfusion without reported diagnosis   . Heart murmur   . Gastric bypass status for obesity    . 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 Dysrhythmia                               Surgical Hx:    Past Surgical History  Procedure Laterality Date  . Gab  2007    in [place]-POUCH 5 CM  . Cholecystectomy  2005    biliary dyskinesia  . Cath self every nite      for sodium bicarb injection (discontinued 2013)  . Colonoscopy  JUN 2012 ABD PN/DIARRHEA WITH PROPOFOL    NL COLON  . Upper gastrointestinal endoscopy  JULY 2011 NAUSEA-D125,V6, PH 25    Bx; GASTRITIS, POUCH-5 CM LONG  . Dilation and curettage of uterus    . Tonsillectomy    . Gastric bypass  06/2006  . Savory dilation  06/20/2012    TGY:BWLS gastritis/Ulcer in the mid jejunum  . Tonsillectomy and adenoidectomy    . Esophagogastroduodenoscopy    . Wisdom tooth extraction    . Repair vaginal cuff N/A 07/30/2014    Procedure: REPAIR VAGINAL CUFF;  Surgeon: Mora Bellman, MD;  Location:  [facility];  Service: Gynecology;  Laterality: N/A;  . Hysteroscopy w/d&c N/A 09/12/2014    Procedure: DILATATION AND CURETTAGE /HYSTEROSCOPY;  Surgeon: Jonnie Kind, MD;  Location: [facility];  Service: Gynecology;  Laterality: N/A;   Medications: Reviewed & Updated - see associated section                      Current outpatient prescriptions:acetaminophen (TYLENOL) 500 MG tablet, Take 1,000 mg by mouth every 6 (six) hours as needed for headache (pain). , Disp: , Rfl: ;  ALPRAZolam (XANAX) 0.5 MG tablet, Take 1 tablet (0.5 mg total) by mouth 3 (three) times daily as needed for sleep or anxiety., Disp: 60 tablet, Rfl: 0;  Ascorbic Acid (VITAMIN C) 100 MG CHEW, Chew 200 mg by mouth at bedtime. , Disp: , Rfl:  BIOTIN PO, Take 1 tablet by mouth at bedtime. , Disp: , Rfl: ;  Cholecalciferol (VITAMIN D3) 2000 UNITS TABS, Take 2,000 Units by mouth at bedtime. , Disp: , Rfl: ;  nortriptyline (PAMELOR) 50 MG capsule, Take 1 capsule (50 mg total) by mouth 2 (two) times daily., Disp: 60 capsule, Rfl: 1;  PARoxetine (PAXIL CR) 25 MG 24 hr tablet, Take 1 tablet  (25 mg total) by mouth daily., Disp: 30 tablet, Rfl: 1 promethazine (PHENERGAN) 25 MG tablet, Take 1 tablet (25 mg total) by mouth every 6 (six) hours as needed for nausea or vomiting., Disp: 30 tablet, Rfl: 0;  zolpidem (AMBIEN) 10 MG tablet, Take 10 mg by mouth at bedtime as needed for sleep., Disp: , Rfl: ;  clonazePAM (KLONOPIN) 1 MG tablet, Take 1 tablet (1 mg total) by mouth 2 (two) times daily. As directed, caution drowsiness, Disp: 60 tablet, Rfl: 0 lidocaine (XYLOCAINE JELLY) 2 % jelly, Apply 1 application topically as needed., Disp: 30 mL, Rfl: 2;  oxyCODONE 10 MG TABS, Take 1 tablet (10 mg total) by mouth every 6 (six) hours as needed for severe pain., Disp: 30 tablet, Rfl: 0   Social History: Reviewed -  reports that she quit smoking about 8 months ago. Her smoking use included Cigarettes. She has a 2.5 pack-year smoking history. She has never used smokeless tobacco.  Objective Findings:  Vitals: Blood pressure 120/70, height $RemoveBefore'5\' 6"'XpkmxtXDwxLEq$  (1.676 m), weight 244 lb (110.678 kg), last menstrual period 09/10/2014, not currently breastfeeding.  Physical Examination: General appearance - alert, well appearing, and in no distress, oriented to person, place, and time and overweight Pelvic - VULVA: normal appearing vulva with no masses, tenderness or lesions, ppatient alleges severe pain with contact with vaginal sidewall bilateral, right >left, but there is nothing abnormal with either sidewall.  VAGINA: normal appearing vagina with normal color, no lesions, normal, thick secretions, physiologic appearing discharge, nonpurulent  CERVIX: normal appearing cervix without discharge or lesions, closed UTERUS: uterus is normal size, shape, consistency and nontender  ADNEXA: normal adnexa in size, nontender and no masses bimbimanual perceived as uncomfortable but no guarding or re bound. Rectal not done.   Assessment & Plan:   A:  1. Psychogenic pain 2 fluid retention , likely premenstrual   P:  1.  Consider Lyrica  2 Hctz 25 daily x 1 wk. 3 return at [facility] for IUD insert  This chart was scribed for Jonnie Kind, MD by Donato Schultz, ED Scribe. This patient was seen in Room 3 and the patient's care was started at 2:24 PM.

## 2014-10-03 NOTE — Progress Notes (Signed)
Patient ID: Penny [name], female   DOB: 29-Oct-1986, 28 y.o.   MRN: [id number] Pt here today for vaginal discharge and odor. Pt states that she has had vaginal discharge and odor for about 4 days, pt states that it is similar to what she had before the D&C. Pt states that she is having pain as well. Pt rates her pain an 8 on a scale from 0-10.

## 2014-10-07 ENCOUNTER — Encounter: Payer: Self-pay | Admitting: Obstetrics and Gynecology

## 2014-10-07 ENCOUNTER — Other Ambulatory Visit: Payer: Self-pay | Admitting: Obstetrics & Gynecology

## 2014-10-07 ENCOUNTER — Other Ambulatory Visit: Payer: Self-pay | Admitting: Family Medicine

## 2014-10-07 NOTE — Telephone Encounter (Signed)
Last filled 09/24/14 #60-20 day supply -8 days early per pharmacy Patient has appt with psych 10/23/14 @ 2:40

## 2014-10-07 NOTE — Telephone Encounter (Signed)
This should be addressed now that patient is not pregnant by her PCP

## 2014-10-07 NOTE — Telephone Encounter (Signed)
1 refill, future refills need to be through psychiatry

## 2014-10-24 ENCOUNTER — Encounter: Payer: Self-pay | Admitting: Family Medicine

## 2014-10-24 ENCOUNTER — Ambulatory Visit (INDEPENDENT_AMBULATORY_CARE_PROVIDER_SITE_OTHER): Payer: Medicaid Other | Admitting: Family Medicine

## 2014-10-24 ENCOUNTER — Ambulatory Visit: Payer: Medicaid Other | Admitting: Advanced Practice Midwife

## 2014-10-24 VITALS — BP 136/80 | Ht 66.0 in | Wt 239.0 lb

## 2014-10-24 DIAGNOSIS — F988 Other specified behavioral and emotional disorders with onset usually occurring in childhood and adolescence: Secondary | ICD-10-CM

## 2014-10-24 DIAGNOSIS — F900 Attention-deficit hyperactivity disorder, predominantly inattentive type: Secondary | ICD-10-CM

## 2014-10-24 IMAGING — CT CT HEAD W/O CM
2 of 3 series · 14 of 30 positions shown, 17 images · non-contrast
Comparison: Brain MR of 08/14/2014.  CT of the head of 08/13/2014.

CLINICAL DATA: Left-sided migraine. Seizures. Blurred vision. Four
weeks postpartum.

EXAM:
CT HEAD WITHOUT CONTRAST
TECHNIQUE: Contiguous axial images were obtained from the base of the skull
through the vertex without intravenous contrast.

[Series 2: headseq 4.8 h37s · axial · 0.43mm/px · z∈[+106,+254]mm · 12 of 36 slices shown, 15 images (1 of 2)]
[im 3/36  brain]
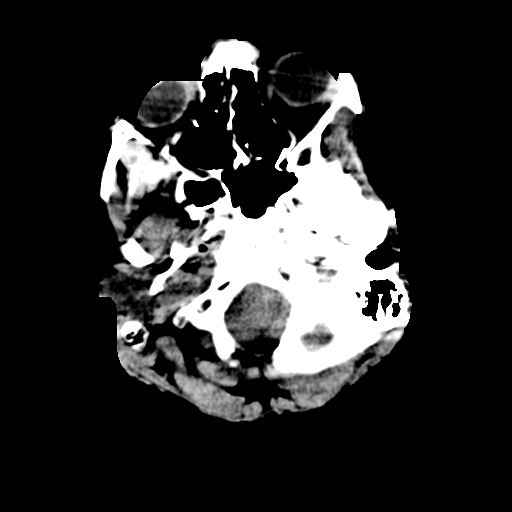
[im 3/36  bone]
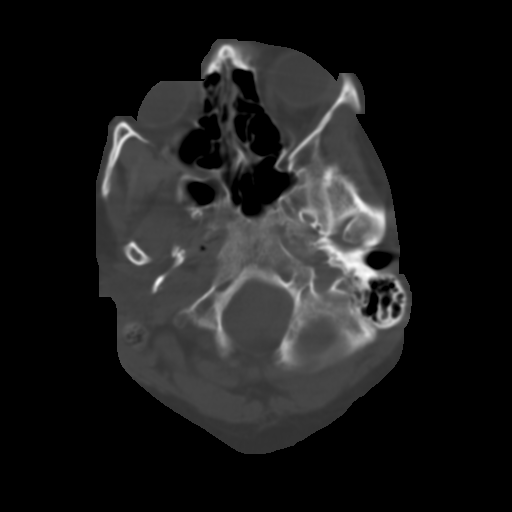
[im 6/36  brain]
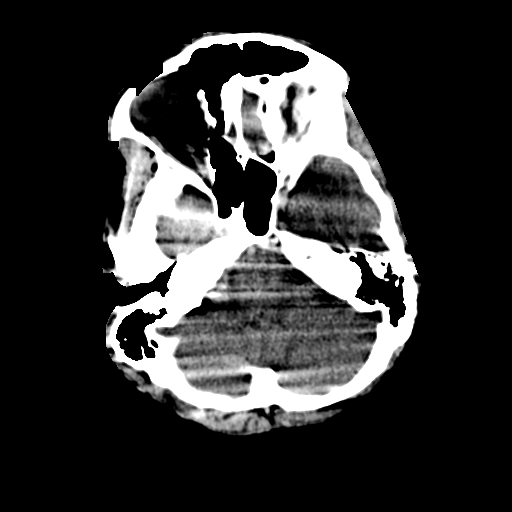
[im 9/36  brain]
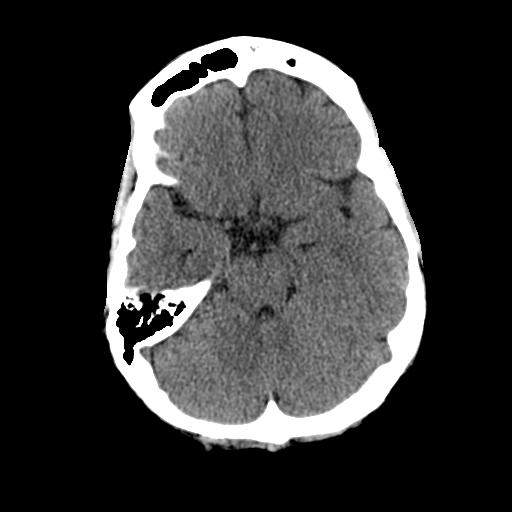
[im 11/36  brain]
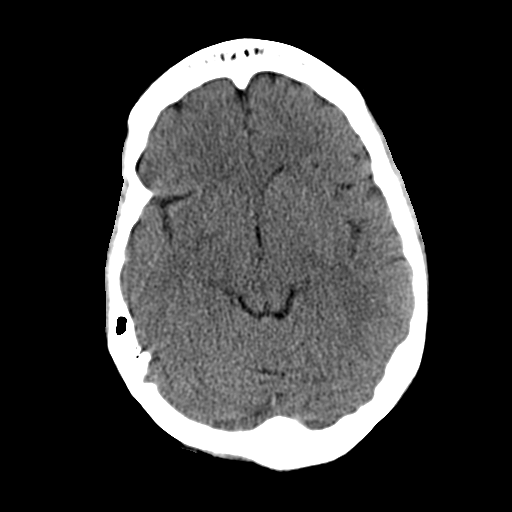
[im 14/36  brain]
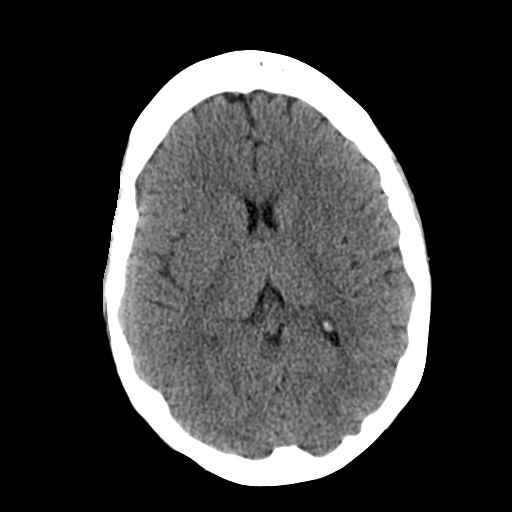
[im 14/36  bone]
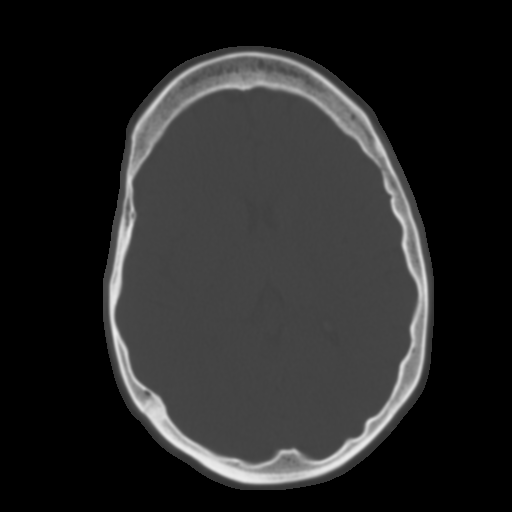
[im 17/36  brain]
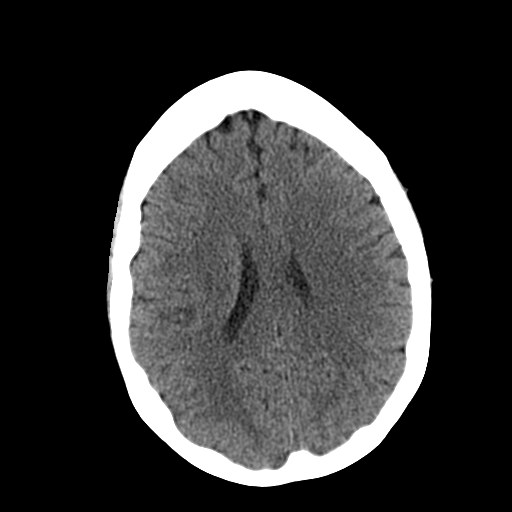
[im 19/36  brain]
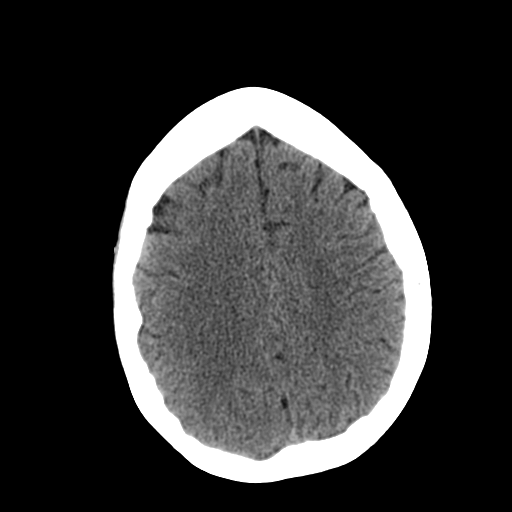
[im 22/36  brain]
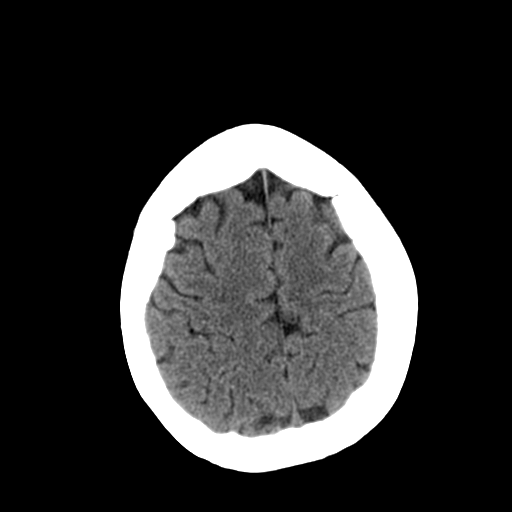
[im 25/36  brain]
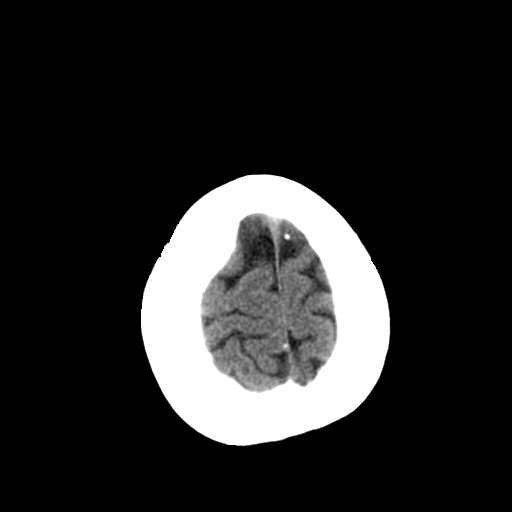
[im 25/36  bone]
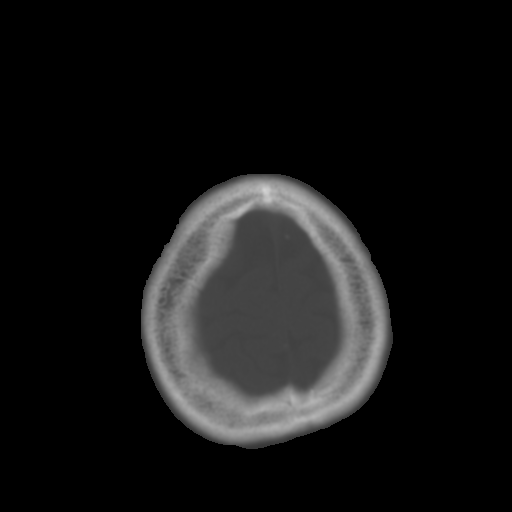
[im 27/36  brain]
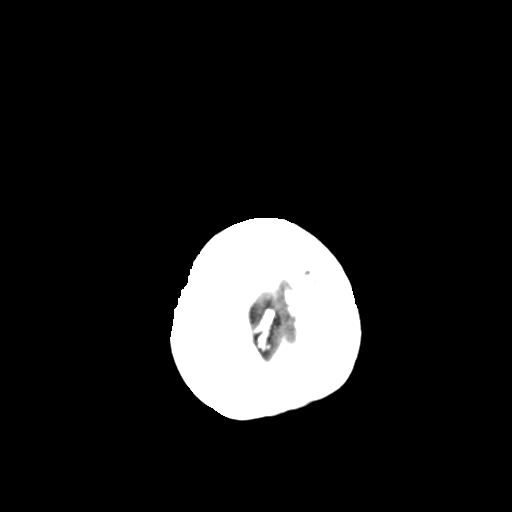
[im 30/36  brain]
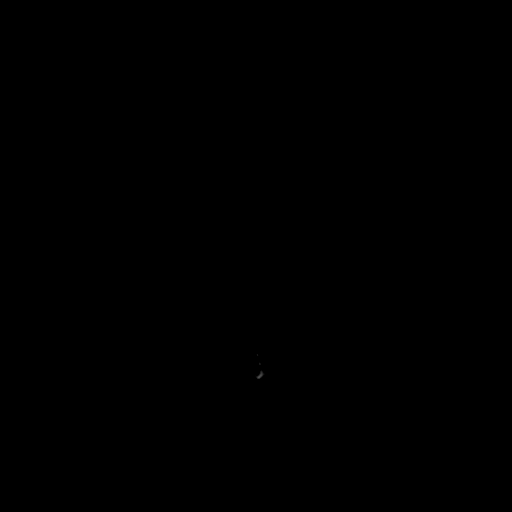
[im 33/36  brain]
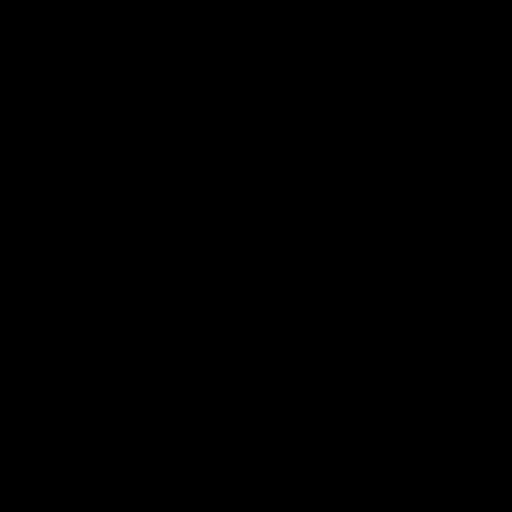

[Series 3: headseq 4.8 h37s · axial · 0.43mm/px · z∈[+100,+119]mm · 2 of 12 slices shown (2 of 2)]
[im 4/12  brain]
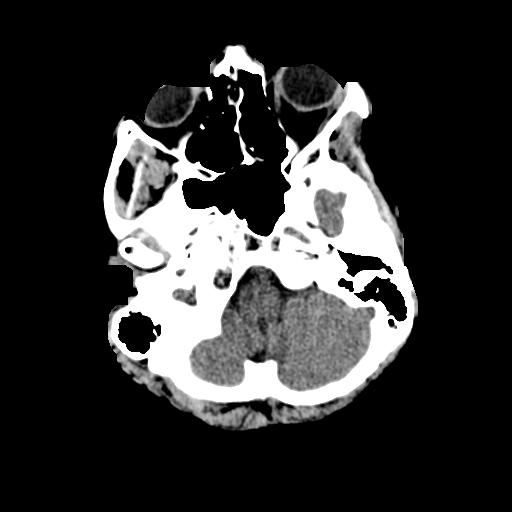
[im 8/12  brain]
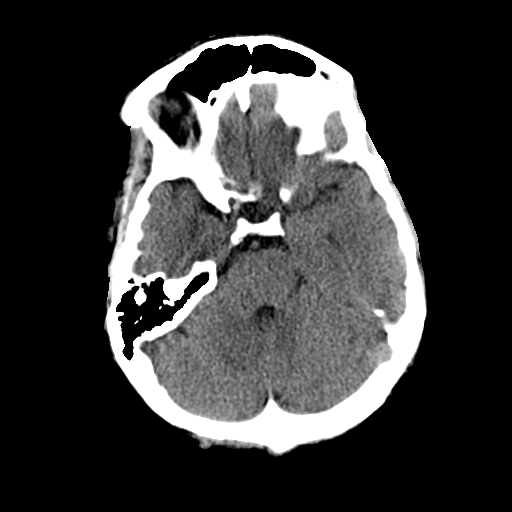

[14 of 30 positions shown; findings below may reference images not displayed]

FINDINGS: Sinuses/Soft tissues: Clear paranasal sinuses and mastoid air cells.

Intracranial: Mild motion degradation. These portions were repeated.

No mass lesion, hemorrhage, hydrocephalus, acute infarct,
intra-axial, or extra-axial fluid collection.
IMPRESSION: Normal head CT.

## 2014-10-24 NOTE — Progress Notes (Signed)
   Subjective:    Patient ID: Penny [name], female    DOB: 07-15-1986, 28 y.o.   MRN: [id number]  HPI Patient is supposed to be here for a f/u visit and to go over the BW, but she did not get the BW done.  She said she came in today to see if you could start her back on the Adderall 30 mg XR.   This patient had problems with pseudoseizures depression and other issues and she stopped taking all of her medicines because she states she was doing fine she states that she did not feel she needed any further medications and she felt that psychiatry caused her significant trouble  Review of Systems     Objective:   Physical Exam  Physical exam was not completed      Assessment & Plan:  ADD-I believe this patient does have ADD. But I am very hesitant to prescribe medication. I told her that it is necessary for this patient to see psychiatry for further evaluation to make sure that in their opinion they feel she is stable for medication. She refuses to see psychiatry. She feels that they messed her up. I asked her how she was feeling she states she has her good days and her bad days but denies any problems. She states that she does not need to take any medications for psychiatric illness and does not want to. The patient was pleasant she understood that we were not going to prescribe ADD medicine without psychiatry seeing her

## 2014-11-02 IMAGING — CT CT ABD-PELV W/ CM
2 of 3 series · 16 of 46 positions shown, 18 images · IV contrast (Omnipaque 300)
Comparison: 06/14/2012

CLINICAL DATA: Heavy vaginal bleeding.  Dizziness.  Weakness.
Abdominal pain.  Shortness of breath.

CT ABDOMEN AND PELVIS WITH CONTRAST
TECHNIQUE: Multidetector CT imaging of the abdomen and pelvis was
performed following the standard protocol during bolus
administration of intravenous contrast.
Contrast: 100mL OMNIPAQUE IOHEXOL 300 MG/ML  SOLN

[Series 2: abd_pel_with 5.0 b40f · axial · 0.68mm/px · z∈[-650,-190]mm · 13 of 106 slices shown, 15 images]
[im 7/106  soft-tissue]
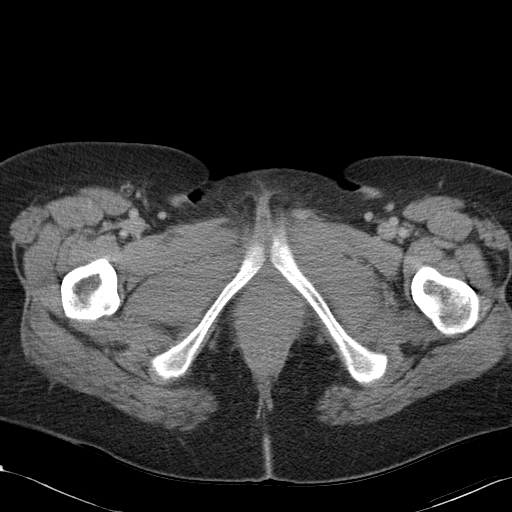
[im 7/106  bone]
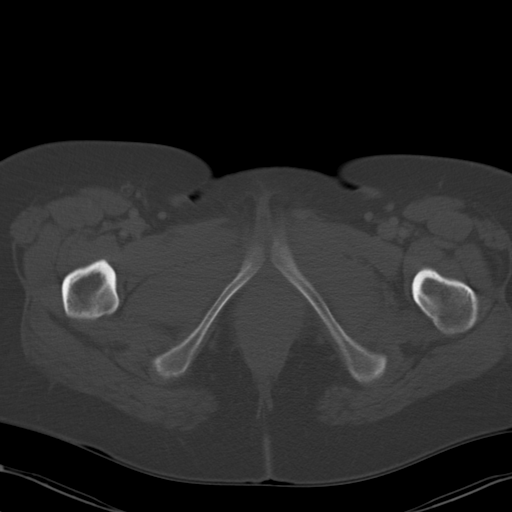
[im 14/106  soft-tissue]
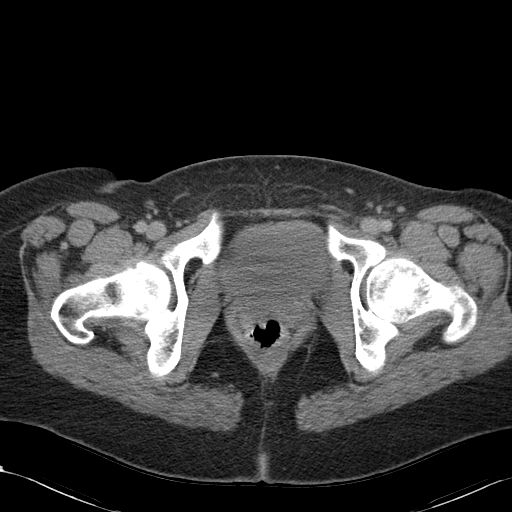
[im 21/106  soft-tissue]
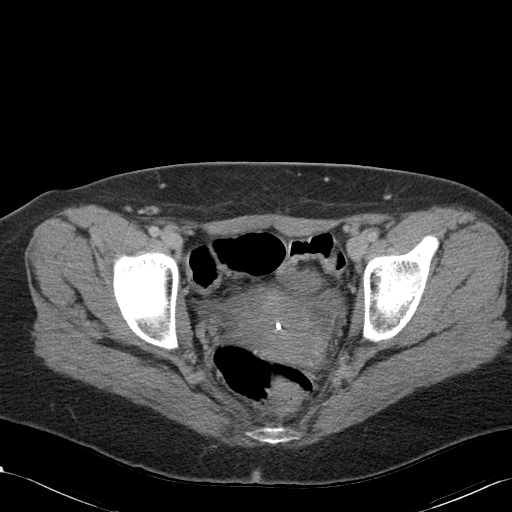
[im 31/106  soft-tissue]
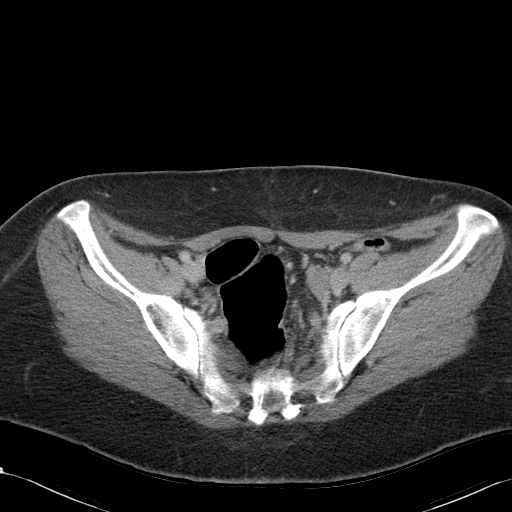
[im 38/106  soft-tissue]
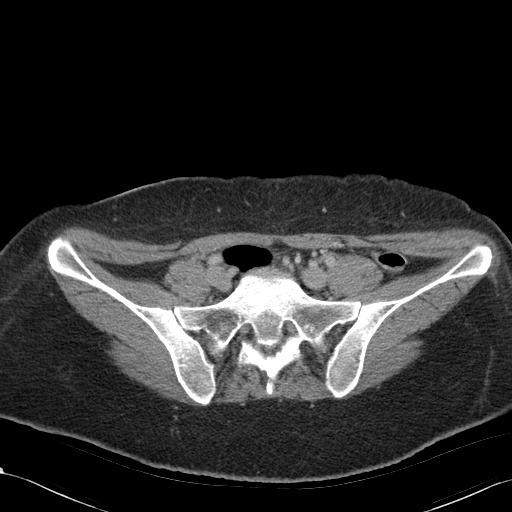
[im 45/106  soft-tissue]
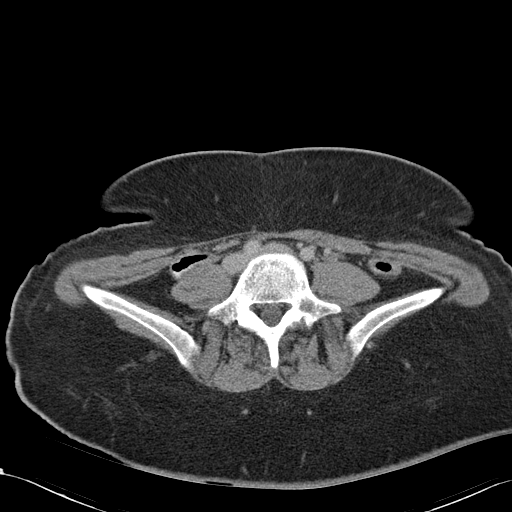
[im 55/106  soft-tissue]
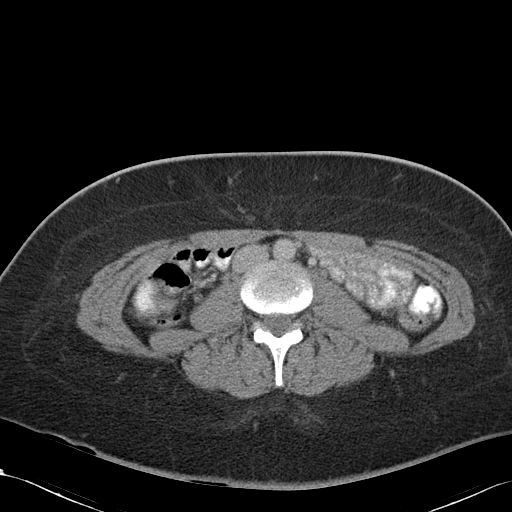
[im 61/106  soft-tissue]
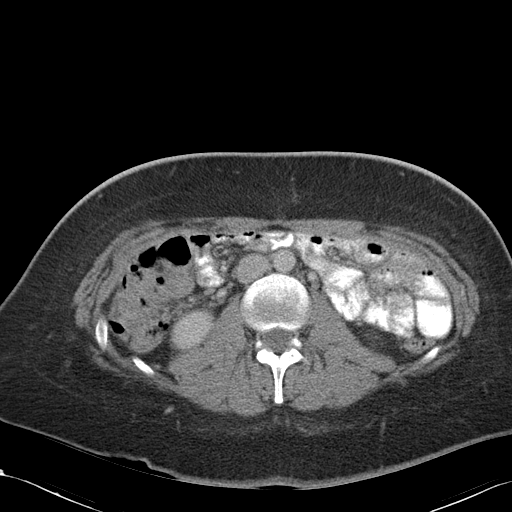
[im 68/106  soft-tissue]
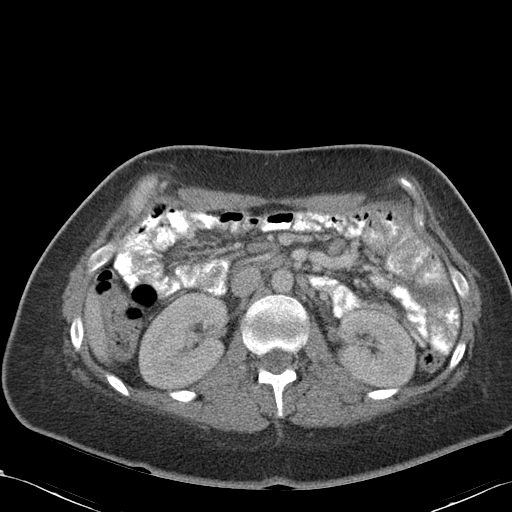
[im 68/106  bone]
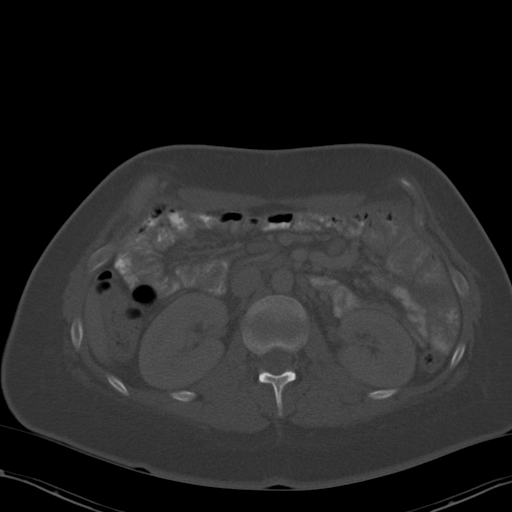
[im 75/106  soft-tissue]
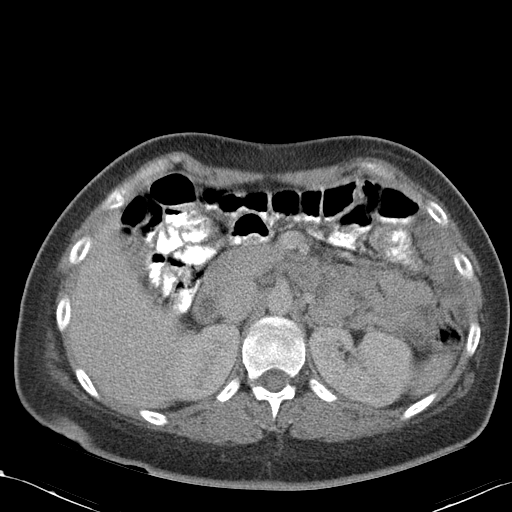
[im 85/106  soft-tissue]
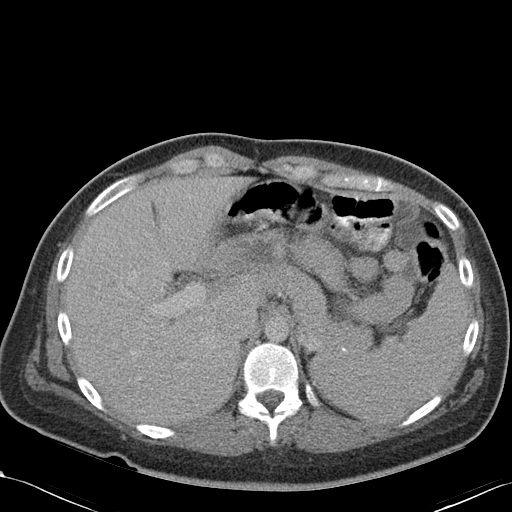
[im 92/106  soft-tissue]
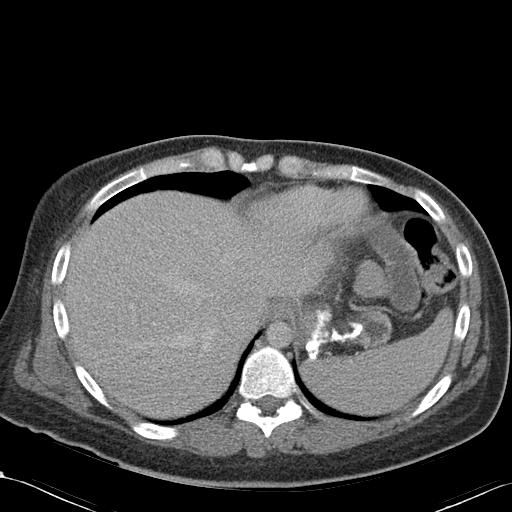
[im 99/106  soft-tissue]
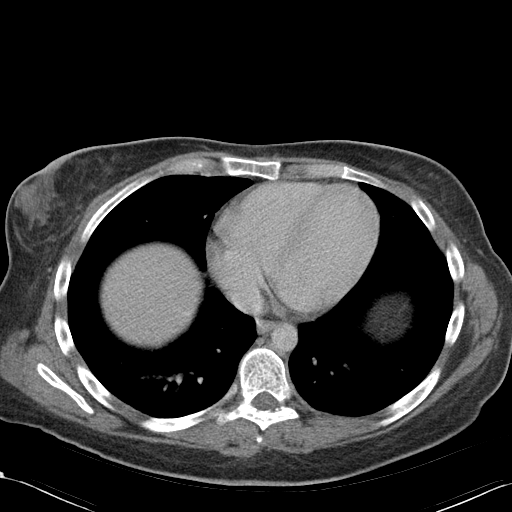

[Series 4: abd_pel_with 3.0 spo · coronal · 0.72mm/px · 3 of 88 slices shown]
[im 30/88  soft-tissue]
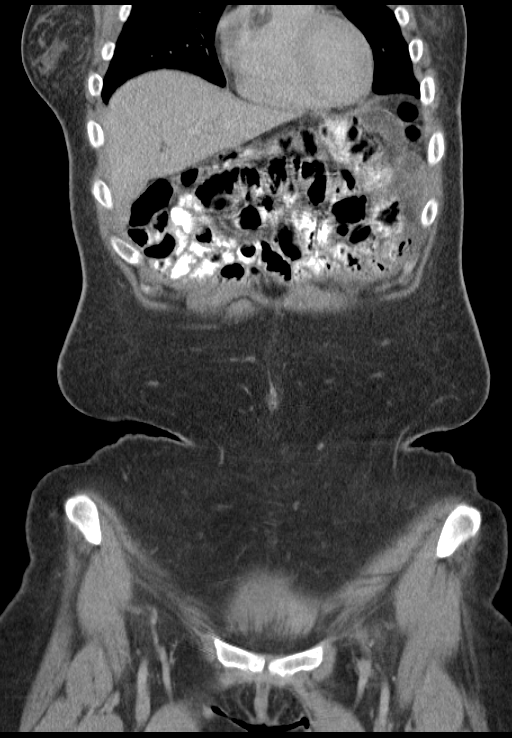
[im 39/88  soft-tissue]
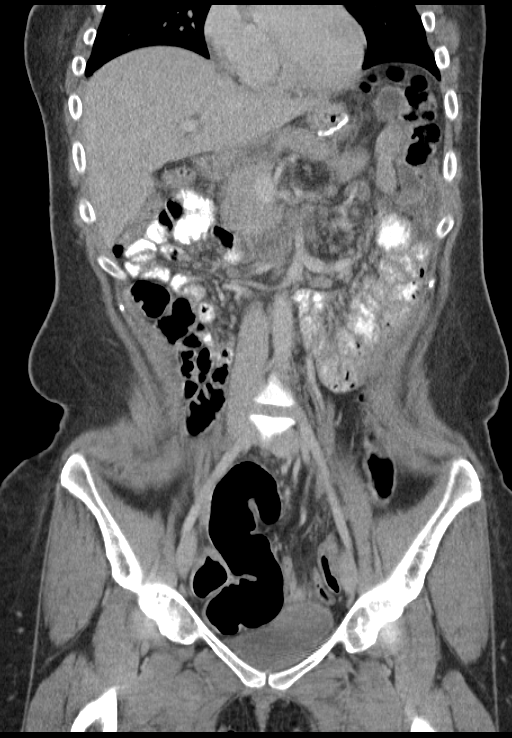
[im 49/88  soft-tissue]
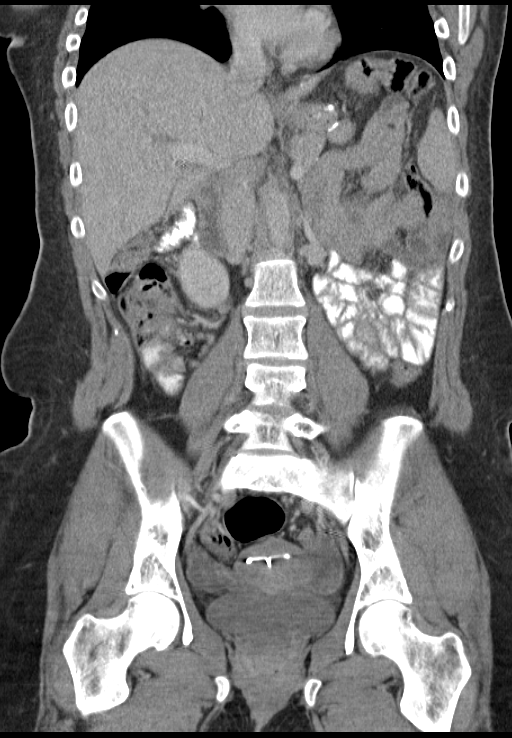

[16 of 46 positions shown; findings below may reference images not displayed]

FINDINGS: Dependent changes in the lung bases.

Surgical absence of the gallbladder.  The liver, spleen, pancreas,
adrenal glands, kidneys, abdominal aorta, and retroperitoneal lymph
nodes are unremarkable.  Postoperative changes consistent with
gastric bypass surgery.  No small or large bowel distension.  No
free air or free fluid in the abdomen.

Pelvis:  Intrauterine device.  Ovaries are not significantly
enlarged.  No free or loculated pelvic fluid collections.  No
diverticulitis.  Appendix is normal.  Normal alignment of the
lumbar vertebrae.
IMPRESSION: No acute process demonstrated in the abdomen or pelvis.  Stable
appearance of postoperative changes.

## 2014-11-03 ENCOUNTER — Emergency Department (HOSPITAL_COMMUNITY)
Admission: EM | Admit: 2014-11-03 | Discharge: 2014-11-03 | Disposition: A | Discharge status: Home / Self Care | Payer: Medicaid Other | Attending: Emergency Medicine | Admitting: Emergency Medicine

## 2014-11-03 ENCOUNTER — Encounter (HOSPITAL_COMMUNITY): Payer: Self-pay | Admitting: Emergency Medicine

## 2014-11-03 DIAGNOSIS — Z79899 Other long term (current) drug therapy: Secondary | ICD-10-CM | POA: Diagnosis not present

## 2014-11-03 DIAGNOSIS — F419 Anxiety disorder, unspecified: Secondary | ICD-10-CM | POA: Diagnosis not present

## 2014-11-03 DIAGNOSIS — Z91040 Latex allergy status: Secondary | ICD-10-CM | POA: Diagnosis not present

## 2014-11-03 DIAGNOSIS — M545 Low back pain: Secondary | ICD-10-CM | POA: Diagnosis present

## 2014-11-03 DIAGNOSIS — G43909 Migraine, unspecified, not intractable, without status migrainosus: Secondary | ICD-10-CM | POA: Diagnosis not present

## 2014-11-03 DIAGNOSIS — Z862 Personal history of diseases of the blood and blood-forming organs and certain disorders involving the immune mechanism: Secondary | ICD-10-CM | POA: Diagnosis not present

## 2014-11-03 DIAGNOSIS — R011 Cardiac murmur, unspecified: Secondary | ICD-10-CM | POA: Insufficient documentation

## 2014-11-03 DIAGNOSIS — Z8742 Personal history of other diseases of the female genital tract: Secondary | ICD-10-CM | POA: Insufficient documentation

## 2014-11-03 DIAGNOSIS — F329 Major depressive disorder, single episode, unspecified: Secondary | ICD-10-CM | POA: Diagnosis not present

## 2014-11-03 DIAGNOSIS — Z87891 Personal history of nicotine dependence: Secondary | ICD-10-CM | POA: Insufficient documentation

## 2014-11-03 DIAGNOSIS — M5432 Sciatica, left side: Secondary | ICD-10-CM | POA: Diagnosis not present

## 2014-11-03 IMAGING — US US ABDOMEN LIMITED
1 series · 14 of 25 positions shown · non-contrast
Comparison: None.

CLINICAL DATA: Elevated liver function tests.

EXAM:
US ABDOMEN LIMITED - RIGHT UPPER QUADRANT

[Series 1: us abdomen limited · 0.24mm/px · 14 of 32 slices shown]
[im 1/32]
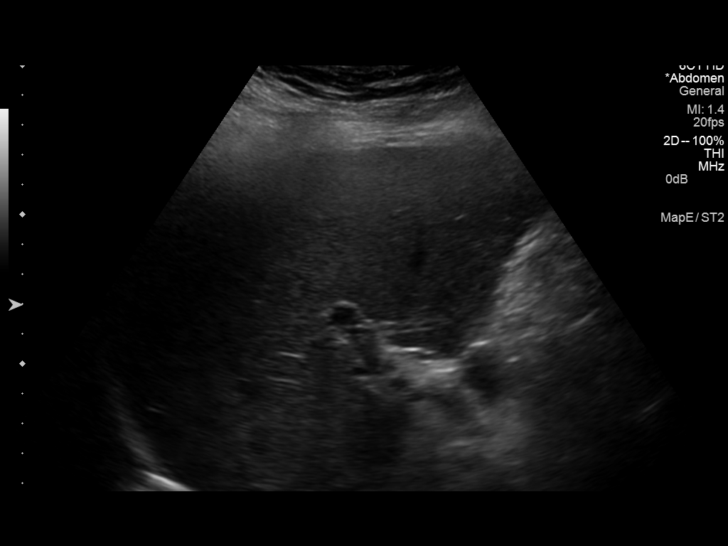
[im 3/32]
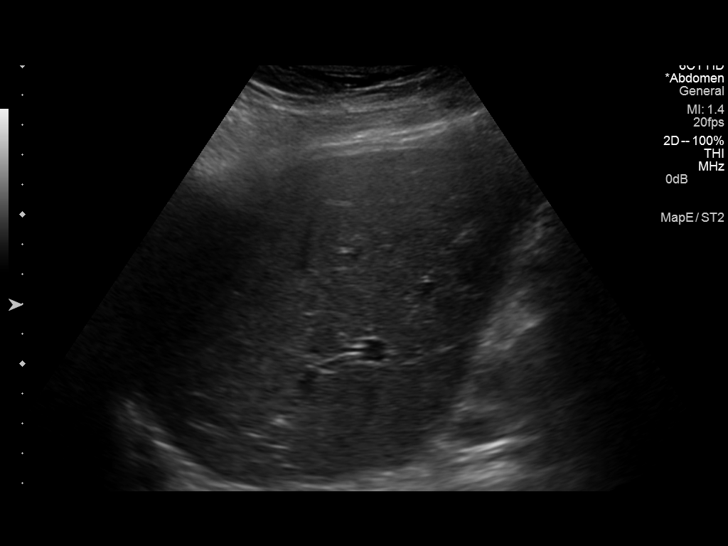
[im 6/32]
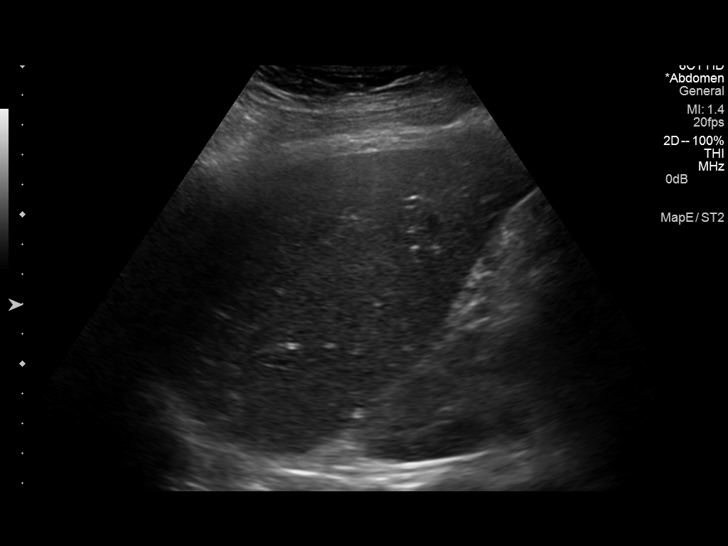
[im 8/32]
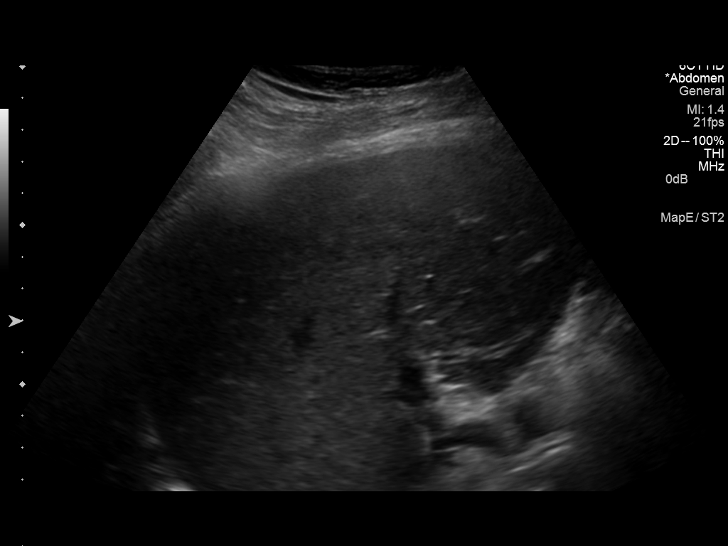
[im 11/32]
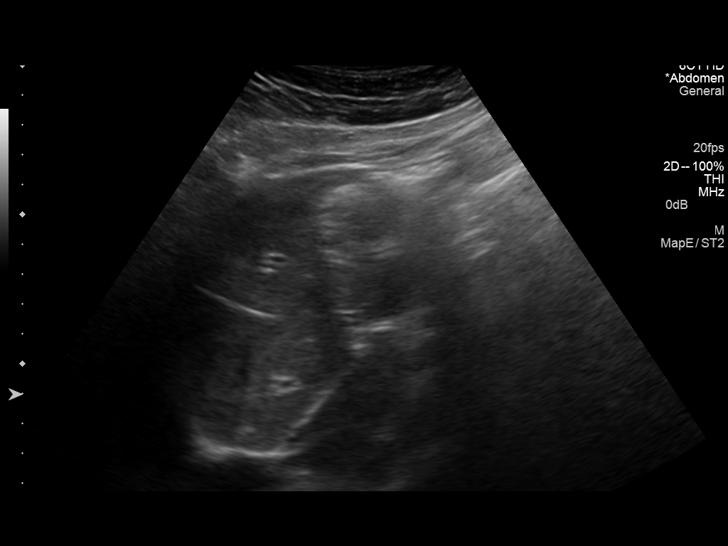
[im 12/32]
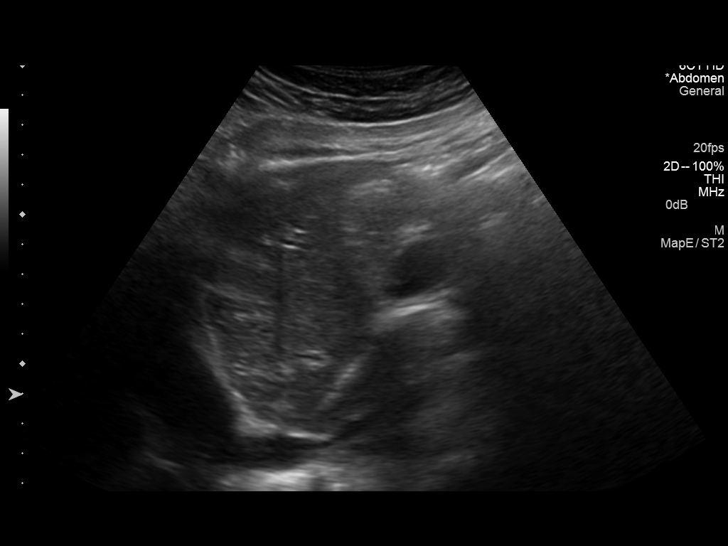
[im 15/32]
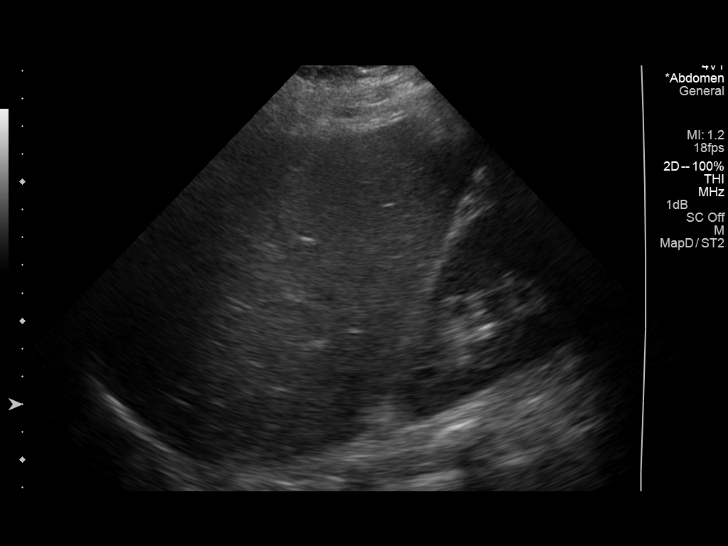
[im 17/32]
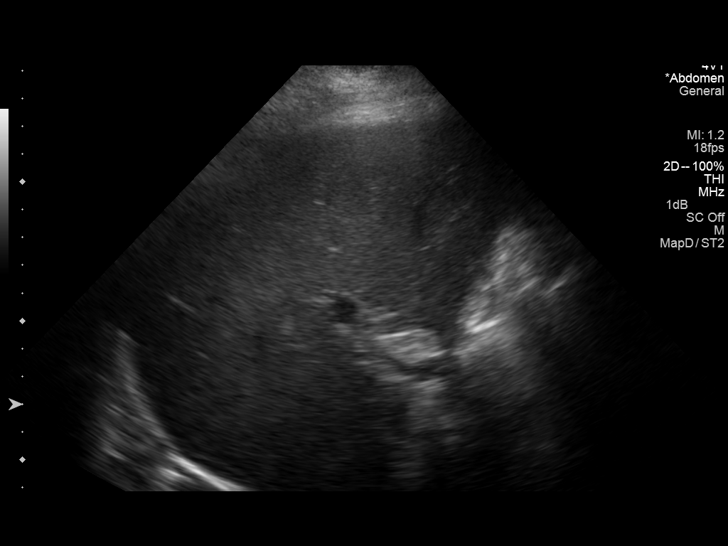
[im 20/32]
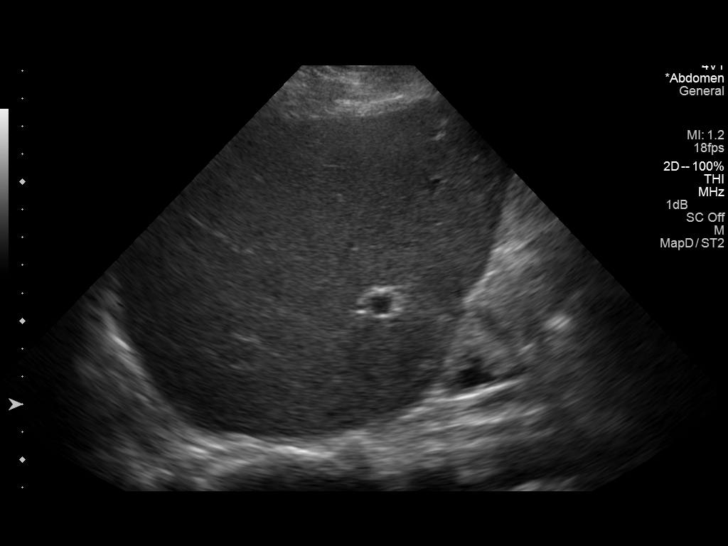
[im 21/32]
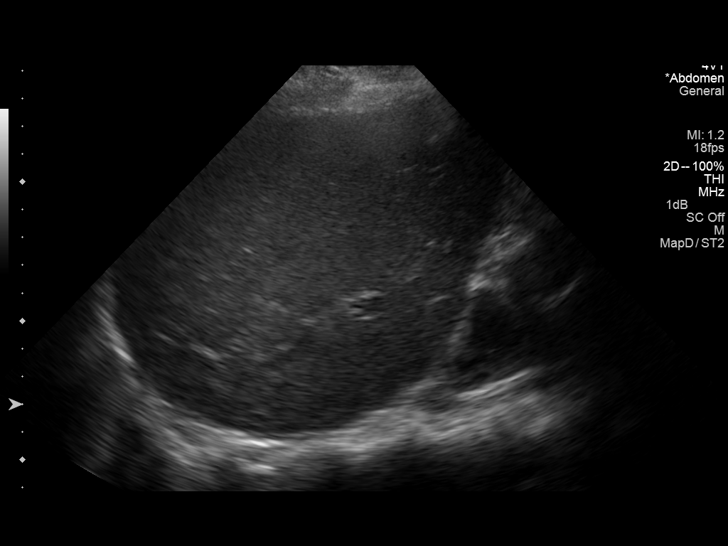
[im 24/32]
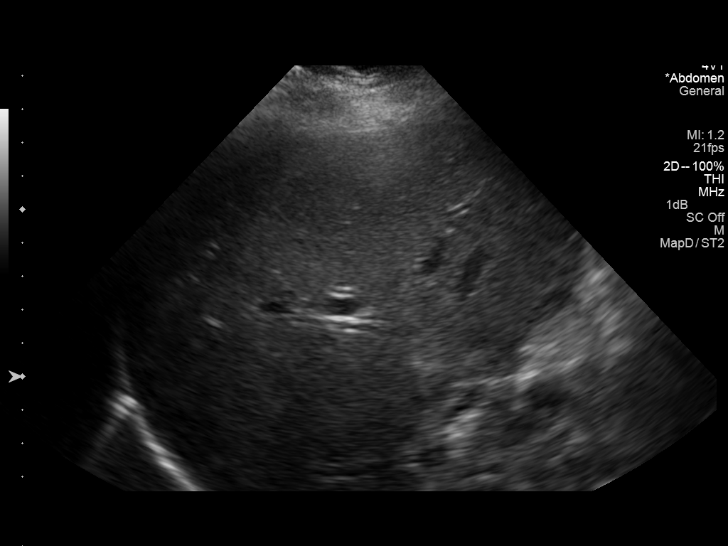
[im 26/32]
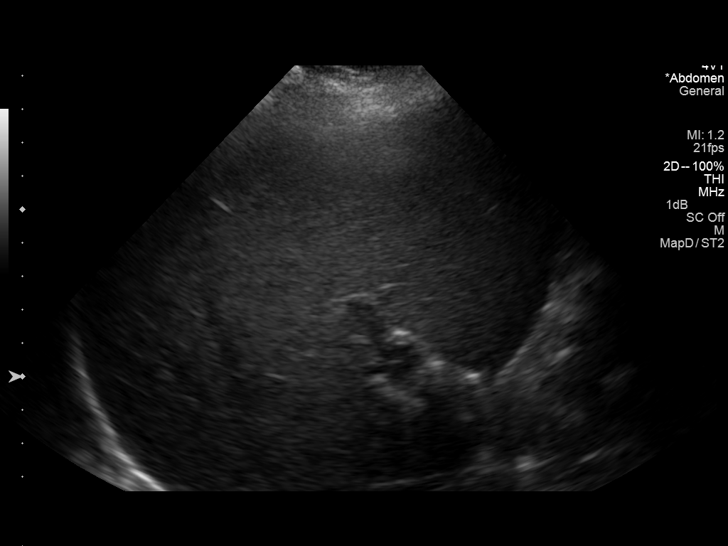
[im 29/32]
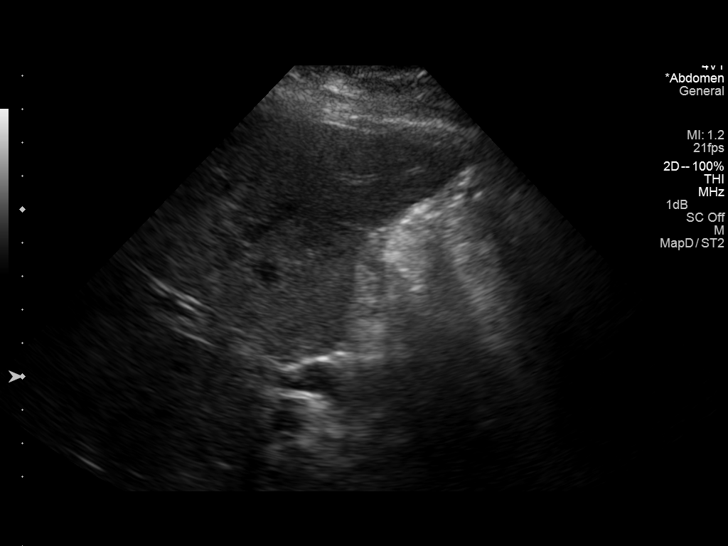
[im 32/32]
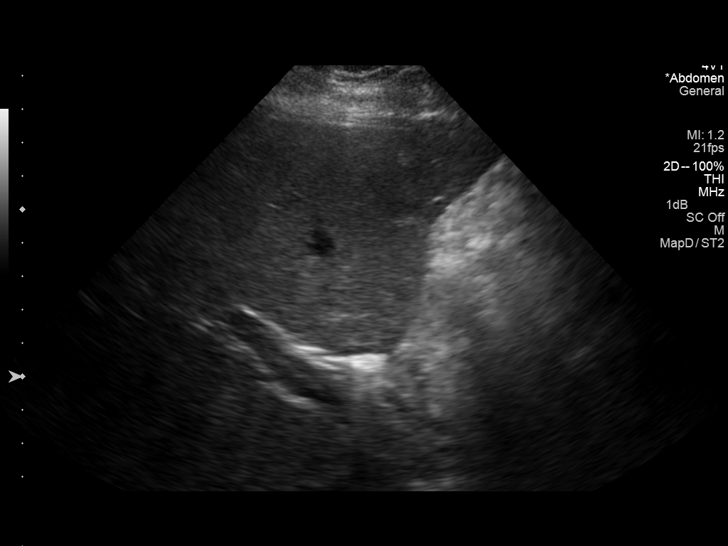

[14 of 25 positions shown; findings below may reference images not displayed]

FINDINGS: Gallbladder:

Status post cholecystectomy.

Common bile duct:

Diameter: 9.1 mm which most likely is due to patient's post
cholecystectomy status. No intrahepatic biliary dilatation is noted.

Liver:

No focal abnormality is seen. Slightly increased echogenicity is
noted suggesting diffuse hepatocellular disease or fatty
infiltration.
IMPRESSION: Status post cholecystectomy. Slightly increased echogenicity is
noted of hepatic parenchyma suggesting diffuse hepatocellular
disease or fatty infiltration.

## 2014-11-03 MED ORDER — OXYCODONE-ACETAMINOPHEN 5-325 MG PO TABS
2.0000 | ORAL_TABLET | Freq: Once | ORAL | Status: AC
  Administered 2014-11-03: 2 via ORAL
  Filled 2014-11-03: qty 2

## 2014-11-03 MED ORDER — OXYCODONE-ACETAMINOPHEN 5-325 MG PO TABS: 1.0000 | ORAL_TABLET | Freq: Four times a day (QID) | ORAL | Status: DC | PRN

## 2014-11-03 NOTE — ED Notes (Signed)
Discharge instructions given, pt demonstrated teach back and verbal understanding. No concerns voiced.  

## 2014-11-03 NOTE — ED Notes (Signed)
Pt shaking from "spasms" in her back. States the spasms have been getting progressively worse. States Pain shoots from site of "botched epidural to left leg". States she has not taken any medicine for relief and has stopped taken all home medications about a month ago because it was "better for the baby and everything".

## 2014-11-03 NOTE — ED Notes (Signed)
Pt has had pain and spasms since complications with epidural with birth of baby on 07/29/14. [facility]. States the pain and spasms to back and left leg have only gotten worse.

## 2014-11-03 NOTE — ED Provider Notes (Signed)
CSN: [id number]     Arrival date & time 11/03/14  0100 History   None    Chief Complaint  Patient presents with  . Back Pain     (Consider location/radiation/quality/duration/timing/severity/associated sxs/prior Treatment) HPI  This is a 28 year old female who has had back pain since being given peripartum epidural on August 24 of this year. She stopped taking pain medicines a month ago because "I don't like to take medication". Her pain has worsened over the past week and she is now having severe pain in her left lower back radiating down the back of her left leg. She is having numbness to the dorsum of the left foot which has been intermittent for several weeks. She denies bowel or bladder changes. She is having difficulty walking due to the pain. She describes the pain as feeling like "spasms". She had an MRI on August 25 which showed no epidural hematoma and some mild degenerative changes.  Past Medical History  Diagnosis Date  . Migraines   . Interstitial cystitis   . IUD FEB 2010  . Gastritis JULY 2011  . Depression   . 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 Fatty liver   . Obesity (BMI 30-39.9) 2011 228 LBS BMI 36.8  . Iron deficiency anemia 07/23/2010  . Irritable bowel syndrome 2012 DIARRHEA    JUN 2012 TTG IgA 14.9  . Elevated liver enzymes JUL 2011ALK PHOS 111-127 AST  143-267 ALT  213-321T BILI 0.6  ALB  3.7-4.06 Jun 2011 ALK PHOS 118 AST 24 ALT 42 T BILI 0.4 ALB 3.9  . Chronic daily headache   . Ovarian cyst   . PONV (postoperative nausea and vomiting)   . ADD (attention deficit disorder)   . RLQ abdominal pain 07/18/2013  . Menorrhagia 07/18/2013  . Anxiety   . Potassium (K) deficiency   . Dysmenorrhea 09/18/2013  . Stress 09/18/2013  . Patient desires pregnancy 09/18/2013  . Pregnant 12/25/2013  . Blood transfusion without reported diagnosis   . Heart murmur   . Gastric bypass status for obesity   . 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 Dysrhythmia     Past Surgical History  Procedure Laterality Date  . Gab  2007    in [place]-POUCH 5 CM  . Cholecystectomy  2005    biliary dyskinesia  . Cath self every nite      for sodium bicarb injection (discontinued 2013)  . Colonoscopy  JUN 2012 ABD PN/DIARRHEA WITH PROPOFOL    NL COLON  . Upper gastrointestinal endoscopy  JULY 2011 NAUSEA-D125,V6, PH 25    Bx; GASTRITIS, POUCH-5 CM LONG  . Dilation and curettage of uterus    . Tonsillectomy    . Gastric bypass  06/2006  . Savory dilation  06/20/2012    WGN:FAOZ gastritis/Ulcer in the mid jejunum  . Tonsillectomy and adenoidectomy    . Esophagogastroduodenoscopy    . Wisdom tooth extraction    . Repair vaginal cuff N/A 07/30/2014    Procedure: REPAIR VAGINAL CUFF;  Surgeon: Mora Bellman, MD;  Location: [facility];  Service: Gynecology;  Laterality: N/A;  . Hysteroscopy w/d&c N/A 09/12/2014    Procedure: DILATATION AND CURETTAGE /HYSTEROSCOPY;  Surgeon: Jonnie Kind, MD;  Location: [facility];  Service: Gynecology;  Laterality: N/A;   Family History  Problem Relation Age of Onset  . Hemochromatosis Maternal Grandmother   . Migraines Maternal Grandmother   . Cancer Maternal Grandmother   .  Hypertension Father   . Diabetes Father   . Coronary artery disease Father   . Migraines Paternal Grandmother   . Cancer Mother     breast  . Hemochromatosis Mother   . Coronary artery disease Paternal Grandfather    History  Substance Use Topics  . Smoking status: Former Smoker -- 0.50 packs/day for 5 years    Types: Cigarettes    Quit date: 01/07/2014  . Smokeless tobacco: Never Used  . Alcohol Use: No   OB History    Gravida Para Term Preterm AB TAB SAB Ectopic Multiple Living   '3 2 1 1 1  1   2     '$ Review of Systems  All other systems reviewed and are negative.   Allergies  Gabapentin; Metoclopramide hcl; Topamax; Codeine; Latex; and Tape  Home Medications   Prior to Admission medications   Medication Sig Start Date End Date  Taking? Authorizing Provider  ALPRAZolam (XANAX) 0.5 MG tablet TAKE ONE TABLET BY MOUTH THREE TIMES DAILY AS NEEDED FOR SLEEP OR ANXIETY. 10/07/14   Kathyrn Drown, MD  clonazePAM (KLONOPIN) 1 MG tablet Take 1 tablet (1 mg total) by mouth 2 (two) times daily. As directed, caution drowsiness 09/02/14   Kathyrn Drown, MD  hydrochlorothiazide (HYDRODIURIL) 25 MG tablet Take 1 tablet (25 mg total) by mouth daily. 10/03/14   Jonnie Kind, MD  nortriptyline (PAMELOR) 50 MG capsule Take 1 capsule (50 mg total) by mouth 2 (two) times daily. 09/05/14   Pamella Pert, MD  zolpidem (AMBIEN) 10 MG tablet Take 10 mg by mouth at bedtime as needed for sleep.    Historical Provider, MD   BP 150/100 mmHg  Pulse 95  Temp(Src) 98.1 F (36.7 C) (Oral)  Resp 18  Ht $R'5\' 6"'JN$  (1.676 m)  Wt 230 lb (104.327 kg)  BMI 37.14 kg/m2  SpO2 98%  LMP 08/03/2014   Physical Exam  General: Well-developed, obese female; appearance consistent with age of record HENT: normocephalic; atraumatic Eyes: pupils equal, round and reactive to light; extraocular muscles intact Neck: supple Heart: regular rate and rhythm Lungs: clear to auscultation bilaterally Abdomen: soft; nondistended; nontender; bowel sounds present Back: Paralumbar tenderness L > R; positive straight leg raise on right at about 45; positive straight leg raise on the left at about 30 Extremities: No deformity; decreased range of motion of hips L > R; pulses normal Neurologic: Awake, alert and oriented; motor function intact in all extremities but exam limited due to pain and/or poor compliance in the lower extremities; decreased sensation dorsal left foot; no facial droop Skin: Warm and dry Psychiatric: Anxious; tremulous; tearful    ED Course  Procedures (including critical care time)   MDM  2:34 AM Pain improved after Percocet. Patient was advised follow-up with neurosurgery or pain management specialist.   Wynetta Fines, MD 11/03/14 [phone]

## 2014-11-03 NOTE — ED Notes (Signed)
Pt took pain meds ordered by EDP, stated she was sorry for being "difficult" . Thanked Nurse for assistance.

## 2014-11-08 ENCOUNTER — Encounter: Payer: Self-pay | Admitting: Family Medicine

## 2014-11-08 ENCOUNTER — Ambulatory Visit (INDEPENDENT_AMBULATORY_CARE_PROVIDER_SITE_OTHER): Payer: Medicaid Other | Admitting: Family Medicine

## 2014-11-08 VITALS — BP 114/76 | Ht 66.0 in | Wt 237.0 lb

## 2014-11-08 DIAGNOSIS — M549 Dorsalgia, unspecified: Secondary | ICD-10-CM

## 2014-11-08 DIAGNOSIS — R5383 Other fatigue: Secondary | ICD-10-CM

## 2014-11-08 DIAGNOSIS — R7309 Other abnormal glucose: Secondary | ICD-10-CM

## 2014-11-08 DIAGNOSIS — R739 Hyperglycemia, unspecified: Secondary | ICD-10-CM

## 2014-11-08 MED ORDER — PREGABALIN 25 MG PO CAPS: 25.0000 mg | ORAL_CAPSULE | Freq: Two times a day (BID) | ORAL | Status: DC

## 2014-11-08 MED ORDER — CHLORZOXAZONE 500 MG PO TABS: 500.0000 mg | ORAL_TABLET | Freq: Three times a day (TID) | ORAL | Status: DC | PRN

## 2014-11-08 MED ORDER — FLUCONAZOLE 150 MG PO TABS: 150.0000 mg | ORAL_TABLET | Freq: Once | ORAL | Status: DC

## 2014-11-08 NOTE — Progress Notes (Signed)
   Subjective:    Patient ID: Penny [name], female    DOB: February 25, 1986, 28 y.o.   MRN: [id number]  HPIFollow up from ED for back pain/spasms. Pain and numbness in legs. Taking ibuprofen, tylenol and aleve. Pt states she is not taking pain med that hospital gave because it makes her sleepy. Needs note to return to work.   Left leg with pain and numbness  Leg numbness occurred after epidural never had it before Review of Systems  Constitutional: Negative for activity change, appetite change and fatigue.  HENT: Negative for congestion.   Endocrine: Negative for polydipsia and polyphagia.  Genitourinary: Negative for frequency.  Musculoskeletal: Positive for gait problem.  Neurological: Positive for seizures (pseudo) and numbness. Negative for tremors and weakness.  Psychiatric/Behavioral: Negative for confusion and dysphoric mood.       Objective:   Physical Exam  Constitutional: She appears well-nourished. No distress.  Cardiovascular: Normal rate, regular rhythm and normal heart sounds.   No murmur heard. Pulmonary/Chest: Effort normal and breath sounds normal. No respiratory distress.  Musculoskeletal: Normal range of motion. She exhibits no edema.  Subjective weakness of left leg  Lymphadenopathy:    She has no cervical adenopathy.  Neurological: She is alert. She exhibits normal muscle tone.  Psychiatric: Her behavior is normal.  Vitals reviewed.         Assessment & Plan:  Leg pain intermittent numbness- try lyrica low dose Neurology referral nop additional MRI Muscle relaxer prn Hx pseudoseizures  May need NCV and EMG

## 2014-11-11 ENCOUNTER — Telehealth: Payer: Self-pay | Admitting: Family Medicine

## 2014-11-11 NOTE — Telephone Encounter (Signed)
Spoke with patient. She said she just wanted you to know and that it was documented that her pain and swelling is worst. She is taking the muscle relaxer and Lyrica. She is alternating b/w hot and cold compresses and doing excercises/massages. Per Dr Nicki Reaper, I explained to her that the Lyrica may take some time for it to reach her system and the neuro ref is in. She verbalized understanding.

## 2014-11-11 NOTE — Telephone Encounter (Signed)
FYI

## 2014-11-11 NOTE — Telephone Encounter (Signed)
Pt is calling to say that the swelling an pain in her back an neck are still  Bad or even getting worse   Would like to know what you think she should do at this point   [place]

## 2014-11-13 ENCOUNTER — Telehealth: Payer: Self-pay | Admitting: Family Medicine

## 2014-11-13 NOTE — Telephone Encounter (Signed)
Pt wanted this documented in her chart. I told her that we sent [facility] her info on Dec. 7th, and they would be contacting her with appt info. Pt verbalized understanding. Pt aware and Dr. Nicki Reaper aware.

## 2014-11-13 NOTE — Telephone Encounter (Signed)
Back and legs not getting any better. Tingling and numbness getting worse.  Doing heat and ice. Not helping.

## 2014-11-14 ENCOUNTER — Encounter: Payer: Self-pay | Admitting: Neurology

## 2014-11-14 ENCOUNTER — Telehealth: Payer: Self-pay | Admitting: Family Medicine

## 2014-11-14 ENCOUNTER — Ambulatory Visit (INDEPENDENT_AMBULATORY_CARE_PROVIDER_SITE_OTHER): Payer: Medicaid Other | Admitting: Neurology

## 2014-11-14 VITALS — BP 129/85 | HR 89 | Ht 66.0 in | Wt 239.0 lb

## 2014-11-14 DIAGNOSIS — F445 Conversion disorder with seizures or convulsions: Secondary | ICD-10-CM

## 2014-11-14 DIAGNOSIS — Z8669 Personal history of other diseases of the nervous system and sense organs: Secondary | ICD-10-CM

## 2014-11-14 DIAGNOSIS — M5432 Sciatica, left side: Secondary | ICD-10-CM | POA: Insufficient documentation

## 2014-11-14 DIAGNOSIS — M5430 Sciatica, unspecified side: Secondary | ICD-10-CM | POA: Insufficient documentation

## 2014-11-14 DIAGNOSIS — R569 Unspecified convulsions: Secondary | ICD-10-CM

## 2014-11-14 HISTORY — DX: Sciatica, left side: M54.32

## 2014-11-14 MED ORDER — PREGABALIN 100 MG PO CAPS: 100.0000 mg | ORAL_CAPSULE | Freq: Two times a day (BID) | ORAL | Status: DC

## 2014-11-14 NOTE — Telephone Encounter (Signed)
Pt needs a work release with no restrictions for Dec 06, 2014 please   Pt been out since February

## 2014-11-14 NOTE — Progress Notes (Signed)
Reason for visit: Left-sided pain  Penny [name] is a 28 y.o. female  History of present illness:  Penny [name] is a 28 year old right-handed white female with migraine headaches. The patient has been seen through this office in 2013 for this issue. The patient delivered a child on 07/29/2014. The patient had preeclampsia around this time. The patient indicated that she received an epidural that initially resulted in some nerve pain down the right leg. The epidural was repeated, and was not completely successful. The patient had some problems with bleeding following delivery, and the patient claims that after delivery, she developed left sided tingling and discomfort that included the left arm and left leg. The patient has pain from the left back all the way down the left leg, and some neck and left arm and shoulder discomfort. The patient also has some sensory alteration on the left side of face. She continues to have discomfort at this time. 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 This was confirmed at [facility]. The patient was never given medications for these events. The patient has continued to have ongoing left arm, neck, back, and leg tingling and numbness. The patient also believes that she is weak on the left side. The patient initially was walking with a walker, but now she is walking independently. The patient has had frequent headaches in the past, but now the headaches are occurring only once every 2 weeks or so. The patient is sent to this office for an evaluation.  Past Medical History  Diagnosis Date  . Migraines   . Interstitial cystitis   . IUD FEB 2010  . Gastritis JULY 2011  . Depression   . 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 Fatty liver   . Obesity (BMI 30-39.9) 2011 228 LBS BMI 36.8  . Iron deficiency anemia 07/23/2010  . Irritable bowel syndrome  2012 DIARRHEA    JUN 2012 TTG IgA 14.9  . Elevated liver enzymes JUL 2011ALK PHOS 111-127 AST  143-267 ALT  213-321T BILI 0.6  ALB  3.7-4.06 Jun 2011 ALK PHOS 118 AST 24 ALT 42 T BILI 0.4 ALB 3.9  . Chronic daily headache   . Ovarian cyst   . PONV (postoperative nausea and vomiting)   . ADD (attention deficit disorder)   . RLQ abdominal pain 07/18/2013  . Menorrhagia 07/18/2013  . Anxiety   . Potassium (K) deficiency   . Dysmenorrhea 09/18/2013  . Stress 09/18/2013  . Patient desires pregnancy 09/18/2013  . Pregnant 12/25/2013  . Blood transfusion without reported diagnosis   . Heart murmur   . Gastric bypass status for obesity   . 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 Dysrhythmia   . Sciatica of left side 11/14/2014    Past Surgical History  Procedure Laterality Date  . Gab  2007    in [place]-POUCH 5 CM  . Cholecystectomy  2005    biliary dyskinesia  . Cath self every nite      for sodium bicarb injection (discontinued 2013)  . Colonoscopy  JUN 2012 ABD PN/DIARRHEA WITH PROPOFOL    NL COLON  . Upper gastrointestinal endoscopy  JULY 2011 NAUSEA-D125,V6, PH 25    Bx; GASTRITIS, POUCH-5 CM LONG  . Dilation and curettage of uterus    . Tonsillectomy    . Gastric bypass  06/2006  . Savory dilation  06/20/2012    VQQ:VZDG gastritis/Ulcer in the mid jejunum  . Tonsillectomy and adenoidectomy    . Esophagogastroduodenoscopy    . Wisdom tooth extraction    . Repair vaginal cuff N/A 07/30/2014    Procedure: REPAIR VAGINAL CUFF;  Surgeon: Mora Bellman, MD;  Location: [facility];  Service: Gynecology;  Laterality: N/A;  . Hysteroscopy w/d&c N/A 09/12/2014    Procedure: DILATATION AND CURETTAGE /HYSTEROSCOPY;  Surgeon: Jonnie Kind, MD;  Location: [facility];  Service: Gynecology;  Laterality: N/A;    Family History  Problem Relation Age of Onset  . Hemochromatosis Maternal Grandmother   . Migraines Maternal Grandmother   . Cancer Maternal Grandmother   . Breast cancer  Maternal Grandmother   . Hypertension Father   . Diabetes Father   . Coronary artery disease Father   . Migraines Paternal Grandmother   . Breast cancer Paternal Grandmother   . Cancer Mother     breast  . Hemochromatosis Mother   . Breast cancer Mother   . Coronary artery disease Paternal Grandfather     Social history:  reports that she quit smoking about 10 months ago. Her smoking use included Cigarettes. She has a 2.5 pack-year smoking history. She has never used smokeless tobacco. She reports that she does not drink alcohol or use illicit drugs.  Medications:  No current outpatient prescriptions on file prior to visit.   No current facility-administered medications on file prior to visit.      Allergies  Allergen Reactions  . Gabapentin Other (See Comments)    Pt states that she was unresponsive after taking this medication, but her vitals remained stable.    . Metoclopramide Hcl Anxiety and Other (See Comments)    Pt states that she felt like she was trapped in a box, and could not get out.  Pt also states that she had temporary loss of movement, weakness, and tingling.    . Topamax [Topiramate] Shortness Of Breath and Other (See Comments)    Adverse side effects.  Pt states this medication changed her taste for a short period of time and caused numbness and tingling in extremities.    . Codeine Nausea And Vomiting  . Latex Rash  . Tape Itching and Rash    ROS:  Out of a complete 14 system review of symptoms, the patient complains only of the following symptoms, and all other reviewed systems are negative.  Weight gain, fatigue Swelling in the legs Ringing in the ears Blurred vision Anemia, easy bruising Joint pain, muscle cramps Allergies, runny nose, skin sensitivity Memory loss, confusion, headache, numbness, weakness, slurred speech, dizziness, seizures Depression, anxiety, decreased energy, change in appetite Insomnia, sleepiness, restless legs   Blood  pressure 129/85, pulse 89, height $RemoveBe'5\' 6"'VQndnjUoV$  (1.676 m), weight 239 lb (108.41 kg), last menstrual period 08/03/2014, not currently breastfeeding.  Physical Exam  General: The patient is alert and cooperative at the time of the examination.The patient is moderately obese.  Eyes: Pupils are equal, round, and reactive to light. Discs are flat bilaterally.  Neck: The neck is supple, no carotid bruits are noted.  Respiratory: The respiratory examination is clear.  Cardiovascular: The cardiovascular examination reveals a regular rate and rhythm, no obvious murmurs or rubs are noted.  Skin: Extremities are without significant edema.  Neurologic Exam  Mental status: The patient is alert and oriented x 3 at the time of the examination. The patient has apparent normal recent and remote  memory, with an apparently normal attention span and concentration ability.  Cranial nerves: Facial symmetry is present. There is good sensation of the face to pinprick and soft touch on the right, decreased on the left. The patient splits midline with vibration sensation on the forehead. The strength of the facial muscles and the muscles to head turning and shoulder shrug are normal bilaterally. Speech is well enunciated, no aphasia or dysarthria is noted. Extraocular movements are full. Visual fields are full. The tongue is midline, and the patient has symmetric elevation of the soft palate. No obvious hearing deficits are noted.  Motor: The motor testing reveals 5 over 5 strength of all 4 extremities. Good symmetric motor tone is noted throughout.  Sensory: Sensory testing is intact to pinprick, soft touch, vibration sensation, and position sense on all 4 extremities, with exception of some decrease in pinprick sensation and vibration sensation on the left arm and leg. No evidence of extinction is noted.  Coordination: Cerebellar testing reveals good finger-nose-finger and heel-to-shin bilaterally.  Gait and station:  Gait is normal. Tandem gait is normal. Romberg is negative. No drift is seen.  Reflexes: Deep tendon reflexes are symmetric and normal bilaterally. Toes are downgoing bilaterally.   MRI lumbar 07/30/14:  IMPRESSION: 1. Negative for lumbar epidural hematoma. No lumbar spinal stenosis. 2. Mild L3-L4 and L4-L5 disc degeneration. Small disc herniation at the latter directed toward the right. No definite nerve root mass effect, but still this could be a source for right L5 radiculitis. 3. Intermittent mild to moderate lumbar facet hypertrophy. 4. Postpartum uterus and trace pelvic free fluid.   MRI brain 07/31/14:  IMPRESSION: Negative exam within limits of evaluation for motion degradation. No acute intracranial findings are evident.     Assessment/Plan:  1. History of pseudoseizures  2. Migraine headache  3. Left hemi-sensory alteration  The patient has had MRI evaluation of the brain, thoracic spine, and lumbar spine that has been relatively unremarkable without explanation for her left-sided symptoms. The patient is reporting sensory alteration of the left face, arm, and leg. The patient be sent for MRI evaluation of the cervical spine at this time. She likely has a psychogenic origin of her current symptoms. This comes in conjunction with pseudoseizures. The patient indicates that over the last 2 weeks, she has had 2 seizure events. The patient will be increased on Lyrica taking 50 mg in the morning, and 100 mg in the evening for 2 weeks, then she will go to 100 mg twice daily. She will follow-up in 3-4 months.   Jill Alexanders MD 11/14/2014 8:08 PM  [facility] [address] [place], [place] [address]  Phone [phone] Fax [phone]

## 2014-11-14 NOTE — Telephone Encounter (Signed)
Pt was seen by Neurology Dr Bobby Rumpf today

## 2014-11-14 NOTE — Patient Instructions (Signed)

## 2014-11-15 NOTE — Telephone Encounter (Signed)
Patient notified and verbalized understanding. 

## 2014-11-15 NOTE — Telephone Encounter (Signed)
In all due respect pt saw Penny [name] on 10th. They ordered MRI of cervical spine. They should write this

## 2014-11-18 ENCOUNTER — Telehealth: Payer: Self-pay | Admitting: Neurology

## 2014-11-18 ENCOUNTER — Ambulatory Visit (INDEPENDENT_AMBULATORY_CARE_PROVIDER_SITE_OTHER): Payer: Medicaid Other | Admitting: Family Medicine

## 2014-11-18 ENCOUNTER — Encounter: Payer: Self-pay | Admitting: Family Medicine

## 2014-11-18 VITALS — Temp 98.4°F | Ht 66.0 in | Wt 238.4 lb

## 2014-11-18 DIAGNOSIS — B9689 Other specified bacterial agents as the cause of diseases classified elsewhere: Secondary | ICD-10-CM

## 2014-11-18 DIAGNOSIS — J019 Acute sinusitis, unspecified: Secondary | ICD-10-CM

## 2014-11-18 MED ORDER — FLUCONAZOLE 150 MG PO TABS: 150.0000 mg | ORAL_TABLET | Freq: Once | ORAL | Status: DC

## 2014-11-18 MED ORDER — CEFPROZIL 500 MG PO TABS: 500.0000 mg | ORAL_TABLET | Freq: Two times a day (BID) | ORAL | Status: DC

## 2014-11-18 MED ORDER — PREDNISONE 5 MG PO TABS: ORAL_TABLET | ORAL | Status: DC

## 2014-11-18 NOTE — Progress Notes (Signed)
   Subjective:    Patient ID: Penny [name], female    DOB: January 23, 1986, 28 y.o.   MRN: [id number]  Sinusitis This is a new problem. The current episode started in the past 7 days. Associated symptoms include coughing and headaches. (Body aches, dizzy) Treatments tried: OTC Meds.   Patient was having severe sciatica but recently things been going very well she would like to be able to return to work she states she is able to flex been squat without trouble   Review of Systems  Respiratory: Positive for cough.   Neurological: Positive for headaches.       Objective:   Physical Exam  Mild/moderate sinus tenderness eardrums normal throat normal neck supple lungs clear Patient seen after hours to prevent ER visit     Assessment & Plan:  #1 sinusitis antibiotics prescribed #2 underlying viral illness as the trigger of the secondary bacterial infection Patient not toxic Chronic lumbar pain with intermittent sciatica she may return to work without restrictions

## 2014-11-18 NOTE — Telephone Encounter (Signed)
Pt is having a lot of pain down her back and leg. It's an ongoing pain, nothing she is trying is helping. She has been vomiting because of the pain.  Please call and advise.

## 2014-11-18 NOTE — Telephone Encounter (Signed)
I called patient. The patient indicates that she is having ongoing pain down both legs. I will call in a prednisone Dosepak. The patient is getting on the Lyrica.

## 2014-11-25 ENCOUNTER — Telehealth: Payer: Self-pay | Admitting: Neurology

## 2014-11-25 MED ORDER — MELOXICAM 7.5 MG PO TABS: 7.5000 mg | ORAL_TABLET | Freq: Every day | ORAL | Status: DC

## 2014-11-25 NOTE — Telephone Encounter (Signed)
Chart reviewed, patient was seen by Dr. [name] in December 10 for migraine, body achy pain,  She has finished prednisone, also taking lyrica.  Please let patient know, I have called in Mobic 7.5mg q day for her, after meal.

## 2014-11-25 NOTE — Telephone Encounter (Signed)
WID - Patient still taking pregabalin (LYRICA) 100 MG capsule and predniSONE (DELTASONE) 5 MG tablet, still not getting any relief from back spasms.  Tried over the counter medication along with heat and ice and nothings helping.  Please call and advise.

## 2014-11-27 NOTE — Telephone Encounter (Signed)
Pt is calling back stating the mobic that was called in is not helping.  She would like for the nurse to call her back.

## 2014-12-02 MED ORDER — PREGABALIN 100 MG PO CAPS: ORAL_CAPSULE | ORAL | Status: DC

## 2014-12-02 MED ORDER — DULOXETINE HCL 30 MG PO CPEP: ORAL_CAPSULE | ORAL | Status: DC

## 2014-12-02 NOTE — Telephone Encounter (Signed)
Left message to clarify how long taking Mobic and to see what type of pain.  Will have doctor advise.

## 2014-12-02 NOTE — Telephone Encounter (Signed)
Patient's mother called and stated patient's condition has worsened.  Not able to form a complete sentence, stuttering and unable to hold any objects in hand.  Informed Mother of prior telephone call Dr. [name] had with patient this am.  Mother, Santiago Glad @ [phone] insisting a return call.

## 2014-12-02 NOTE — Telephone Encounter (Signed)
I called the patient. The patient continues to have pain in the back, and legs. She also reports shocklike sensations down the arms with each heartbeat. MRI evaluation of the entire neuro axis is been unremarkable. The patient has pseudoseizures. The patient is on Lyrica taking 100 mg in the morning, 200 mg in the evening. I'll have her stop the Mobic, as this is not helping. The patient gained no benefit from prednisone. I will place her on Cymbalta.

## 2014-12-02 NOTE — Telephone Encounter (Signed)
I called the mother. The patient has had some increased swelling in ankles, this may be related to the Lyrica. The rest of the symptoms of numbness are likely psychogenic. The patient may need to drop back to take 100 mg twice daily of Lyrica.

## 2014-12-10 ENCOUNTER — Telehealth: Payer: Self-pay | Admitting: Neurology

## 2014-12-10 NOTE — Telephone Encounter (Signed)
I called patient. She called again about the leg pain. She has only been on the Cymbalta 30 mg daily, she has not yet gone to the 30 mg twice daily dose. She will go up on the medication now. Continue the Lyrica.

## 2014-12-10 NOTE — Telephone Encounter (Signed)
Pt started DULoxetine (CYMBALTA) 30 MG capsule and pregabalin (LYRICA) 100 MG capsule about a week and a half ago, but has no relief.  She is scheduled for MRI next Thursday.  The pain is getting very bad.  Please call and advise.

## 2014-12-16 ENCOUNTER — Other Ambulatory Visit: Payer: Self-pay | Admitting: *Deleted

## 2014-12-16 ENCOUNTER — Telehealth: Payer: Self-pay | Admitting: Family Medicine

## 2014-12-16 MED ORDER — PROMETHAZINE HCL 25 MG PO TABS: 25.0000 mg | ORAL_TABLET | Freq: Two times a day (BID) | ORAL | Status: DC | PRN

## 2014-12-16 MED ORDER — TIZANIDINE HCL 4 MG PO TABS: 4.0000 mg | ORAL_TABLET | Freq: Three times a day (TID) | ORAL | Status: DC | PRN

## 2014-12-16 MED ORDER — FLUCONAZOLE 150 MG PO TABS: 150.0000 mg | ORAL_TABLET | Freq: Once | ORAL | Status: DC

## 2014-12-16 NOTE — Telephone Encounter (Signed)
error 

## 2014-12-18 DIAGNOSIS — M542 Cervicalgia: Secondary | ICD-10-CM | POA: Insufficient documentation

## 2014-12-19 ENCOUNTER — Ambulatory Visit
Admission: RE | Admit: 2014-12-19 | Discharge: 2014-12-19 | Disposition: A | Discharge status: Home / Self Care | Payer: Medicaid Other | Source: Ambulatory Visit | Attending: Neurology | Admitting: Neurology

## 2014-12-19 ENCOUNTER — Encounter (INDEPENDENT_AMBULATORY_CARE_PROVIDER_SITE_OTHER): Payer: Medicaid Other | Admitting: Diagnostic Neuroimaging

## 2014-12-19 DIAGNOSIS — M5432 Sciatica, left side: Secondary | ICD-10-CM

## 2014-12-19 DIAGNOSIS — F445 Conversion disorder with seizures or convulsions: Secondary | ICD-10-CM

## 2014-12-19 DIAGNOSIS — Z8669 Personal history of other diseases of the nervous system and sense organs: Secondary | ICD-10-CM

## 2014-12-19 DIAGNOSIS — R569 Unspecified convulsions: Secondary | ICD-10-CM

## 2014-12-20 ENCOUNTER — Telehealth: Payer: Self-pay | Admitting: Adult Health

## 2014-12-20 NOTE — Telephone Encounter (Signed)
Left message I called call me Monday

## 2014-12-21 ENCOUNTER — Telehealth: Payer: Self-pay | Admitting: Neurology

## 2014-12-21 MED ORDER — DULOXETINE HCL 30 MG PO CPEP: ORAL_CAPSULE | ORAL | Status: DC

## 2014-12-21 MED ORDER — PREGABALIN 100 MG PO CAPS: ORAL_CAPSULE | ORAL | Status: DC

## 2014-12-21 NOTE — Telephone Encounter (Signed)
I called the patient. The MRI of the cervical spine is OK. She is still having a lot of pain. I will increase the cymbalta to 30 mg in the morning and 60 mg in the evening. She needs a RF on the Lyrica.   MRI cervical spine 12/20/14:   IMPRESSION:  Unremarkable MRI cervical spine (without) demonstrating: 1. Minimal degenerative changes from C3-4 to C6-7. No spinal stenosis or foraminal narrowing. 2. No change from MRI on 09/27/12.

## 2014-12-24 ENCOUNTER — Telehealth: Payer: Self-pay | Admitting: *Deleted

## 2014-12-24 ENCOUNTER — Ambulatory Visit (INDEPENDENT_AMBULATORY_CARE_PROVIDER_SITE_OTHER): Payer: Medicaid Other | Admitting: Obstetrics and Gynecology

## 2014-12-24 ENCOUNTER — Encounter: Payer: Self-pay | Admitting: Obstetrics and Gynecology

## 2014-12-24 VITALS — BP 122/76 | Ht 66.0 in | Wt 246.0 lb

## 2014-12-24 DIAGNOSIS — Z3202 Encounter for pregnancy test, result negative: Secondary | ICD-10-CM

## 2014-12-24 DIAGNOSIS — N898 Other specified noninflammatory disorders of vagina: Secondary | ICD-10-CM

## 2014-12-24 DIAGNOSIS — Z3200 Encounter for pregnancy test, result unknown: Secondary | ICD-10-CM

## 2014-12-24 LAB — POCT URINE PREGNANCY: PREG TEST UR: NEGATIVE

## 2014-12-24 NOTE — Progress Notes (Addendum)
Patient ID: Penny [name], female   DOB: [birth year], 29 y.o.   MRN: [id number] Pt here today for vaginal discharge, had since she delivered it's just gotten worse the past 2 weeks. Pt states that the pain and discharge are the same as before she had the D&C.    [facility] Visit  Patient name: Penny [name] MRN [id number]  Date of birth: 1986-10-23  CC & HPI:  Penny [name] is a 29 y.o. female presenting today for multiple concerns 1. Emotional due to family changes, "marriage falling apart" , baby's been sick, pt is in & Out of [place] and [place]., Dad walked out of her life x 4 months with no ongoing contact. Pt quit meds , cymbalta, klonipin, lyrica, was recently put on cymbalta by Dr [name]. Check withPharmacy reveals that pt has active refils available for cymbalta and Lyrica. 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  ROS:  Last Seizure was 3 wk ago, very brief. Pt was reported to DSS by [facility], was investigated and exonerated. Pt isn't sleeping and feels "rock bottom" pt get insomnia, anorexia. Doesn't get suicidal or homicidal.b Husband's dad had MI , in cardiac rehab. Baby is growing ., admitted for [facility]d at [facility] then [facility] at [facility] No Sz activity by baby. " really fussy", and cries a lot.PT reports baby doesn't console easily. At end of visit , pt reveals that her husband is returning to work tomorrow, and pt is anxious of being in house with all the kids , and doesn't think she can make it. Pt has excuses as to why she hasn't stayed on meds. Pt resistant to coming back on Friday, fearful of bad weather, etc.  Pt helps with care of husbands siblings (34/10, 31) when they visit dad on ED. Pertinent History Reviewed:   Reviewed: Significant for  Medical         Past Medical History  Diagnosis Date  . Migraines   . Interstitial cystitis   . IUD FEB 2010  . Gastritis JULY 2011  . Depression   . Anemia 2011    2o to GASTRIC  BYPASS  . Fatty liver   . Obesity (BMI 30-39.9) 2011 228 LBS BMI 36.8  . Iron deficiency anemia 07/23/2010  . Irritable bowel syndrome 2012 DIARRHEA    JUN 2012 TTG IgA 14.9  . Elevated liver enzymes JUL 2011ALK PHOS 111-127 AST  143-267 ALT  213-321T BILI 0.6  ALB  3.7-4.06 Jun 2011 ALK PHOS 118 AST 24 ALT 42 T BILI 0.4 ALB 3.9  . Chronic daily headache   . Ovarian cyst   . PONV (postoperative nausea and vomiting)   . ADD (attention deficit disorder)   . RLQ abdominal pain 07/18/2013  . Menorrhagia 07/18/2013  . Anxiety   . Potassium (K) deficiency   . Dysmenorrhea 09/18/2013  . Stress 09/18/2013  . Patient desires pregnancy 09/18/2013  . Pregnant 12/25/2013  . Blood transfusion without reported diagnosis   . Heart murmur   . Gastric bypass status for obesity   . 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 Dysrhythmia   . Sciatica of left side 11/14/2014  . Fibromyalgia                               Surgical Hx:    Past Surgical History  Procedure Laterality  Date  . Gab  2007    in [place]-POUCH 5 CM  . Cholecystectomy  2005    biliary dyskinesia  . Cath self every nite      for sodium bicarb injection (discontinued 2013)  . Colonoscopy  JUN 2012 ABD PN/DIARRHEA WITH PROPOFOL    NL COLON  . Upper gastrointestinal endoscopy  JULY 2011 NAUSEA-D125,V6, PH 25    Bx; GASTRITIS, POUCH-5 CM LONG  . Dilation and curettage of uterus    . Tonsillectomy    . Gastric bypass  06/2006  . Savory dilation  06/20/2012    HYW:VPXT gastritis/Ulcer in the mid jejunum  . Tonsillectomy and adenoidectomy    . Esophagogastroduodenoscopy    . Wisdom tooth extraction    . Repair vaginal cuff N/A 07/30/2014    Procedure: REPAIR VAGINAL CUFF;  Surgeon: Mora Bellman, MD;  Location: [facility];  Service: Gynecology;  Laterality: N/A;  . Hysteroscopy w/d&c N/A 09/12/2014    Procedure: DILATATION AND CURETTAGE /HYSTEROSCOPY;  Surgeon: Jonnie Kind, MD;  Location: [facility];  Service: Gynecology;   Laterality: N/A;   Medications: Reviewed & Updated - see associated section                      No current outpatient prescriptions on file.   Social History: Reviewed -  reports that she quit smoking about a year ago. Her smoking use included Cigarettes. She has a 2.5 pack-year smoking history. She has never used smokeless tobacco.  Objective Findings:  Vitals: Blood pressure 122/76, height $RemoveBefore'5\' 6"'XgZSSYHNtitUd$  (1.676 m), weight 246 lb (111.585 kg), last menstrual period 12/19/2014, not currently breastfeeding.  Physical Examination: General appearance - alert, well appearing, and in no distress, oriented to person, place, and time, anxious and crying Mental status - alert, oriented to person, place, and time, depressed mood, anxious Eyes - pupils equal and reactive, extraocular eye movements intact Heart - normal rate, regular rhythm, normal S1, S2, no murmurs, rubs, clicks or gallops, normal rate and regular rhythm Abdomen - soft, nontender, nondistended, no masses or organomegaly Pelvic - normal external genitalia, vulva, vagina, cervix, uterus and adnexa anovulatory clear mucus   Assessment & Plan:   A:  1. Anxiety disorder, Husband returning to work tomorrow 1. Anovulation  2. weitght gain s/p del, s/p gastic sleve 3, multiple psych concerns, no SI, HI. 4. Hx seizures. 5. Will not Rx klonipin without pt restarting other meds   P:  1. Provera 10 x 10 d. Resume cymbalta and lyrica. [facility] 3.resume meds and I'll reassess in 3 days to consider Klonipin.  At end of visit , pt reveals that her husband is returning to work tomorrow, and pt is anxious of being in house with all the kids , and doesn't think she can make it. Pt has excuses as to why she hasn't stayed on meds. Pt resistant to coming back on Friday, fearful of bad weather, etc.

## 2014-12-24 NOTE — Addendum Note (Signed)
Addended by: Jonnie Kind on: 12/24/2014 04:41 PM   Modules accepted: Level of Service

## 2014-12-24 NOTE — Telephone Encounter (Signed)
Complaining of mucous discharge since delivery 07/29/14, worse in last 2 weeks, has burning in bladder area, no period since delivery, some spotting, has back pain but has disc problems, wants to be seen today, to come in at 3:30 to see Dr Glo Herring.

## 2014-12-25 ENCOUNTER — Telehealth: Payer: Self-pay | Admitting: Family Medicine

## 2014-12-25 NOTE — Telephone Encounter (Signed)
Pt has been down with severe migraines for the past two days and is  Out of her migraine meds. Pt is needing something for the migraines.

## 2014-12-26 ENCOUNTER — Ambulatory Visit (INDEPENDENT_AMBULATORY_CARE_PROVIDER_SITE_OTHER): Payer: Medicaid Other | Admitting: Family Medicine

## 2014-12-26 DIAGNOSIS — F410 Panic disorder [episodic paroxysmal anxiety] without agoraphobia: Secondary | ICD-10-CM

## 2014-12-26 DIAGNOSIS — Z8669 Personal history of other diseases of the nervous system and sense organs: Secondary | ICD-10-CM

## 2014-12-26 DIAGNOSIS — M549 Dorsalgia, unspecified: Secondary | ICD-10-CM

## 2014-12-26 MED ORDER — ZOLPIDEM TARTRATE 5 MG PO TABS: 5.0000 mg | ORAL_TABLET | Freq: Every evening | ORAL | Status: DC | PRN

## 2014-12-26 MED ORDER — DULOXETINE HCL 60 MG PO CPEP: 60.0000 mg | ORAL_CAPSULE | Freq: Every day | ORAL | Status: DC

## 2014-12-26 MED ORDER — PROMETHAZINE HCL 25 MG PO TABS
25.0000 mg | ORAL_TABLET | Freq: Four times a day (QID) | ORAL | Status: DC | PRN
Start: 2014-12-26 — End: 2015-01-02

## 2014-12-26 MED ORDER — CLONAZEPAM 0.5 MG PO TABS: 0.5000 mg | ORAL_TABLET | Freq: Two times a day (BID) | ORAL | Status: DC | PRN

## 2014-12-26 NOTE — Telephone Encounter (Signed)
Saw pt today see note

## 2014-12-27 ENCOUNTER — Ambulatory Visit: Payer: Medicaid Other | Admitting: Obstetrics and Gynecology

## 2014-12-27 NOTE — Progress Notes (Signed)
   Subjective:    Patient ID: Penny [name], female    DOB: [birth year], 29 y.o.   MRN: [id number]  HPI She comes in today because of multiple different issues She is having significant stress in her life. Her parents went through divorce. She does not have much contact with her dad. It tends to make her sad in finds herself feeling very frustrated she denies being depressed. She does state at times as she is anxious and does have intermittent spells of sadness. She will be seen counselor as well as hopefully a psychiatrist with [facility]. She is requested be placed on a medication to help her with panic attacks she is having as well as intermittent sadness she denies being suicidal or homicidal  She has frequent intermittent headaches sometimes bad enough to cause nausea occasional vomiting no diarrhea with it no sweats or chills or fever. She denies double vision. She has a history of migraines. It's been worse since she's been under stress  She does have history of low back pain but she does state that this is actually doing better she has less pain discomfort better range of motion and she feels that she can go back to work she is requesting a release know.   Review of Systems Please see above. There is no radicular pain. No chest pain vomiting abdominal pain. There is headaches but no double vision does not wake at night    Objective:   Physical Exam  Neurologically she is normal her neck is supple her lungs are clear no crackles heart is regular no murmurs pulses normal range of motion in lower back good negative straight leg raise neurologic grossly normal      Assessment & Plan:  #1 headaches I feel that this is stress-related she can use Phenergan when necessary for nausea and over-the-counter medications to help with discomfort I would recommend no narcotics.  Significant stress panic attacks in some signs of depression but not true depression. Patient agrees to increase  Cymbalta. She is been on this for some radicular pain which is doing much better so we will go to 60 mg daily. She may use Klonopin maximum twice a day as necessary for panic attacks and Phenergan when necessary for nausea caution drowsiness with Klonopin as well as a Phenergan.  Patient has significant insomnia that is been worse since going through episodes of stress and anxiety related to her dad separating himself from her. Ambien 5 mg daily at bedtime she know she needs to develop 8 hours of sleep to avoid interactions. She understands that it's very important for her to not use this on nights where she is getting less an 8 hour sleep or rest.  She denies being suicidal. Denies being homicidal. She relates she will follow-up with [facility].  She will follow-up here in 6-8 weeks 25 minutes spent with patient

## 2014-12-30 ENCOUNTER — Encounter (HOSPITAL_COMMUNITY): Payer: Self-pay | Admitting: Emergency Medicine

## 2014-12-30 ENCOUNTER — Telehealth: Payer: Self-pay | Admitting: Neurology

## 2014-12-30 ENCOUNTER — Emergency Department (HOSPITAL_COMMUNITY)
Admission: EM | Admit: 2014-12-30 | Discharge: 2014-12-30 | Disposition: A | Discharge status: Home / Self Care | Payer: Medicaid Other | Attending: Emergency Medicine | Admitting: Emergency Medicine

## 2014-12-30 ENCOUNTER — Telehealth: Payer: Self-pay | Admitting: Diagnostic Neuroimaging

## 2014-12-30 ENCOUNTER — Emergency Department (HOSPITAL_COMMUNITY): Payer: Medicaid Other

## 2014-12-30 DIAGNOSIS — F419 Anxiety disorder, unspecified: Secondary | ICD-10-CM | POA: Diagnosis not present

## 2014-12-30 DIAGNOSIS — Y9389 Activity, other specified: Secondary | ICD-10-CM | POA: Insufficient documentation

## 2014-12-30 DIAGNOSIS — R110 Nausea: Secondary | ICD-10-CM | POA: Diagnosis not present

## 2014-12-30 DIAGNOSIS — R531 Weakness: Secondary | ICD-10-CM | POA: Insufficient documentation

## 2014-12-30 DIAGNOSIS — Z975 Presence of (intrauterine) contraceptive device: Secondary | ICD-10-CM | POA: Insufficient documentation

## 2014-12-30 DIAGNOSIS — Z8719 Personal history of other diseases of the digestive system: Secondary | ICD-10-CM | POA: Diagnosis not present

## 2014-12-30 DIAGNOSIS — Z6839 Body mass index (BMI) 39.0-39.9, adult: Secondary | ICD-10-CM | POA: Diagnosis not present

## 2014-12-30 DIAGNOSIS — Z862 Personal history of diseases of the blood and blood-forming organs and certain disorders involving the immune mechanism: Secondary | ICD-10-CM | POA: Diagnosis not present

## 2014-12-30 DIAGNOSIS — Y998 Other external cause status: Secondary | ICD-10-CM | POA: Insufficient documentation

## 2014-12-30 DIAGNOSIS — M797 Fibromyalgia: Secondary | ICD-10-CM | POA: Insufficient documentation

## 2014-12-30 DIAGNOSIS — Y9289 Other specified places as the place of occurrence of the external cause: Secondary | ICD-10-CM | POA: Diagnosis not present

## 2014-12-30 DIAGNOSIS — R5383 Other fatigue: Secondary | ICD-10-CM | POA: Diagnosis not present

## 2014-12-30 DIAGNOSIS — G43909 Migraine, unspecified, not intractable, without status migrainosus: Secondary | ICD-10-CM | POA: Diagnosis not present

## 2014-12-30 DIAGNOSIS — S3992XA Unspecified injury of lower back, initial encounter: Secondary | ICD-10-CM | POA: Diagnosis not present

## 2014-12-30 DIAGNOSIS — Z3202 Encounter for pregnancy test, result negative: Secondary | ICD-10-CM | POA: Diagnosis not present

## 2014-12-30 DIAGNOSIS — Z8742 Personal history of other diseases of the female genital tract: Secondary | ICD-10-CM | POA: Insufficient documentation

## 2014-12-30 DIAGNOSIS — E669 Obesity, unspecified: Secondary | ICD-10-CM | POA: Diagnosis not present

## 2014-12-30 DIAGNOSIS — Z91040 Latex allergy status: Secondary | ICD-10-CM | POA: Insufficient documentation

## 2014-12-30 DIAGNOSIS — Z87891 Personal history of nicotine dependence: Secondary | ICD-10-CM | POA: Diagnosis not present

## 2014-12-30 DIAGNOSIS — R200 Anesthesia of skin: Secondary | ICD-10-CM | POA: Insufficient documentation

## 2014-12-30 DIAGNOSIS — M5116 Intervertebral disc disorders with radiculopathy, lumbar region: Secondary | ICD-10-CM | POA: Diagnosis not present

## 2014-12-30 DIAGNOSIS — W1830XA Fall on same level, unspecified, initial encounter: Secondary | ICD-10-CM | POA: Insufficient documentation

## 2014-12-30 DIAGNOSIS — S0990XA Unspecified injury of head, initial encounter: Secondary | ICD-10-CM | POA: Insufficient documentation

## 2014-12-30 DIAGNOSIS — R011 Cardiac murmur, unspecified: Secondary | ICD-10-CM | POA: Insufficient documentation

## 2014-12-30 DIAGNOSIS — M5430 Sciatica, unspecified side: Secondary | ICD-10-CM

## 2014-12-30 LAB — CBC WITH DIFFERENTIAL/PLATELET
BASOS PCT: 0 % (ref 0–1)
Basophils Absolute: 0 10*3/uL (ref 0.0–0.1)
EOS PCT: 1 % (ref 0–5)
Eosinophils Absolute: 0.1 10*3/uL (ref 0.0–0.7)
HCT: 30 % — ABNORMAL LOW (ref 36.0–46.0)
HEMOGLOBIN: 9.3 g/dL — AB (ref 12.0–15.0)
Lymphocytes Relative: 35 % (ref 12–46)
Lymphs Abs: 2 10*3/uL (ref 0.7–4.0)
MCH: 23.3 pg — ABNORMAL LOW (ref 26.0–34.0)
MCHC: 31 g/dL (ref 30.0–36.0)
MCV: 75.2 fL — ABNORMAL LOW (ref 78.0–100.0)
MONO ABS: 0.4 10*3/uL (ref 0.1–1.0)
Monocytes Relative: 8 % (ref 3–12)
NEUTROS ABS: 3.3 10*3/uL (ref 1.7–7.7)
Neutrophils Relative %: 56 % (ref 43–77)
Platelets: 348 10*3/uL (ref 150–400)
RBC: 3.99 MIL/uL (ref 3.87–5.11)
RDW: 15.7 % — ABNORMAL HIGH (ref 11.5–15.5)
WBC: 5.8 10*3/uL (ref 4.0–10.5)

## 2014-12-30 LAB — URINALYSIS, ROUTINE W REFLEX MICROSCOPIC
Bilirubin Urine: NEGATIVE
Glucose, UA: NEGATIVE mg/dL
Hgb urine dipstick: NEGATIVE
KETONES UR: NEGATIVE mg/dL
Leukocytes, UA: NEGATIVE
NITRITE: NEGATIVE
PH: 7.5 (ref 5.0–8.0)
Protein, ur: NEGATIVE mg/dL
Specific Gravity, Urine: 1.011 (ref 1.005–1.030)
Urobilinogen, UA: 0.2 mg/dL (ref 0.0–1.0)

## 2014-12-30 LAB — BASIC METABOLIC PANEL
Anion gap: 7 (ref 5–15)
BUN: 10 mg/dL (ref 6–23)
CALCIUM: 9.6 mg/dL (ref 8.4–10.5)
CO2: 28 mmol/L (ref 19–32)
Chloride: 103 mmol/L (ref 96–112)
Creatinine, Ser: 0.61 mg/dL (ref 0.50–1.10)
GFR calc Af Amer: >90 mL/min (ref >90)
GFR calc non Af Amer: >90 mL/min (ref >90)
GLUCOSE: 101 mg/dL — AB (ref 70–99)
Potassium: 4.5 mmol/L (ref 3.5–5.1)
Sodium: 138 mmol/L (ref 135–145)

## 2014-12-30 LAB — PREGNANCY, URINE: PREG TEST UR: NEGATIVE

## 2014-12-30 MED ORDER — HYDROMORPHONE HCL 1 MG/ML IJ SOLN
1.0000 mg | Freq: Once | INTRAMUSCULAR | Status: AC
  Administered 2014-12-30: 1 mg via INTRAVENOUS
  Filled 2014-12-30: qty 1

## 2014-12-30 MED ORDER — SODIUM CHLORIDE 0.9 % IV BOLUS (SEPSIS)
500.0000 mL | Freq: Once | INTRAVENOUS | Status: AC
  Administered 2014-12-30: 500 mL via INTRAVENOUS

## 2014-12-30 MED ORDER — PROMETHAZINE HCL 25 MG/ML IJ SOLN
25.0000 mg | Freq: Once | INTRAMUSCULAR | Status: AC
  Administered 2014-12-30: 25 mg via INTRAVENOUS
  Filled 2014-12-30: qty 1

## 2014-12-30 MED ORDER — HYDROCODONE-ACETAMINOPHEN 5-325 MG PO TABS: 1.0000 | ORAL_TABLET | ORAL | Status: DC | PRN

## 2014-12-30 MED ORDER — FENTANYL CITRATE 0.05 MG/ML IJ SOLN: 50.0000 ug | INTRAMUSCULAR | Status: DC | PRN

## 2014-12-30 MED ORDER — PROMETHAZINE HCL 25 MG PO TABS: 25.0000 mg | ORAL_TABLET | Freq: Three times a day (TID) | ORAL | Status: DC | PRN

## 2014-12-30 MED ORDER — KETOROLAC TROMETHAMINE 15 MG/ML IJ SOLN
15.0000 mg | Freq: Once | INTRAMUSCULAR | Status: AC
  Administered 2014-12-30: 15 mg via INTRAVENOUS
  Filled 2014-12-30: qty 1

## 2014-12-30 MED ORDER — HYDROCODONE-ACETAMINOPHEN 5-325 MG PO TABS
2.0000 | ORAL_TABLET | Freq: Once | ORAL | Status: AC
  Administered 2014-12-30: 2 via ORAL
  Filled 2014-12-30: qty 2

## 2014-12-30 NOTE — ED Notes (Signed)
Pt sent to ED by her neurologist for frequent falls. Pt reports last fall was this morning, pt called her neurologist and was told to come to ED. Pt reports that she has fallen 4 times in last 24 hours. Pt reports last night when she fell she hit her head. Pt denies +LOC. Pt reports that she ambulates with a walker. Her legs give out causing her to fall.

## 2014-12-30 NOTE — Telephone Encounter (Signed)
Telephone:    Please also see Dr, Penumalli's note).   Still feeling weak.   She called at 8:00 to let us know she was on way to ER in [place].   I let her know I would let you know

## 2014-12-30 NOTE — Telephone Encounter (Signed)
Patient called reporting more pain, lower ext spasms, for past few days. She is falling down. Advised her that I would give message to Dr. [name] for review. She needs a face-face evaluation in clinic or ER. She tells me that she is planning to go to the ER with her mother.    Penni Bombard, MD 2/63/7858, 8:50 AM Certified in Neurology, Neurophysiology and Neuroimaging  [facility] [address] [place], [place] [address] [phone]

## 2014-12-30 NOTE — ED Provider Notes (Signed)
CSN: [id number]     Arrival date & time 12/30/14  0867 History   First MD Initiated Contact with Patient 12/30/14 [phone]     Chief Complaint  Patient presents with  . Fall     (Consider location/radiation/quality/duration/timing/severity/associated sxs/prior Treatment) HPI Comments: 29 year old female with depression, anxiety, sciatica obesity, IBS, migraine headaches, panic disorder, opiate addiction history was sent by her neurologist for frequent falls and worsening symptoms. Patient said worsening right lower back pain with radiation down the right leg numbness and weakness for the past 2 days. This is similar to previous but worse. Patient had an MRI done of her back and thorax after her epidural for which she said the symptoms all started. This is probably 5 months ago. Patient uses a walker at times to help her. Patient feels the reason for fall is the pain and the right leg gives out, no syncope. Mild head injury however no vomiting no blood thinners.  Patient is a 29 y.o. female presenting with fall. The history is provided by the patient.  Fall Pertinent negatives include no chest pain, no abdominal pain, no headaches and no shortness of breath.    Past Medical History  Diagnosis Date  . Migraines   . Interstitial cystitis   . IUD FEB 2010  . Gastritis JULY 2011  . Depression   . 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 Fatty liver   . Obesity (BMI 30-39.9) 2011 228 LBS BMI 36.8  . Iron deficiency anemia 07/23/2010  . Irritable bowel syndrome 2012 DIARRHEA    JUN 2012 TTG IgA 14.9  . Elevated liver enzymes JUL 2011ALK PHOS 111-127 AST  143-267 ALT  213-321T BILI 0.6  ALB  3.7-4.06 Jun 2011 ALK PHOS 118 AST 24 ALT 42 T BILI 0.4 ALB 3.9  . Chronic daily headache   . Ovarian cyst   . PONV (postoperative nausea and vomiting)   . ADD (attention deficit disorder)   . RLQ abdominal pain 07/18/2013  . Menorrhagia 07/18/2013  . Anxiety   . Potassium (K) deficiency   .  Dysmenorrhea 09/18/2013  . Stress 09/18/2013  . Patient desires pregnancy 09/18/2013  . Pregnant 12/25/2013  . Blood transfusion without reported diagnosis   . Heart murmur   . Gastric bypass status for obesity   . 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 Dysrhythmia   . Sciatica of left side 11/14/2014  . Fibromyalgia    Past Surgical History  Procedure Laterality Date  . Gab  2007    in [place]-POUCH 5 CM  . Cholecystectomy  2005    biliary dyskinesia  . Cath self every nite      for sodium bicarb injection (discontinued 2013)  . Colonoscopy  JUN 2012 ABD PN/DIARRHEA WITH PROPOFOL    NL COLON  . Upper gastrointestinal endoscopy  JULY 2011 NAUSEA-D125,V6, PH 25    Bx; GASTRITIS, POUCH-5 CM LONG  . Dilation and curettage of uterus    . Tonsillectomy    . Gastric bypass  06/2006  . Savory dilation  06/20/2012    KDT:OIZT gastritis/Ulcer in the mid jejunum  . Tonsillectomy and adenoidectomy    . Esophagogastroduodenoscopy    . Wisdom tooth extraction    . Repair vaginal cuff N/A 07/30/2014    Procedure: REPAIR VAGINAL CUFF;  Surgeon: Mora Bellman, MD;  Location: [facility];  Service: Gynecology;  Laterality: N/A;  . Hysteroscopy w/d&c N/A 09/12/2014  Procedure: DILATATION AND CURETTAGE /HYSTEROSCOPY;  Surgeon: Jonnie Kind, MD;  Location: [facility];  Service: Gynecology;  Laterality: N/A;   Family History  Problem Relation Age of Onset  . Hemochromatosis Maternal Grandmother   . Migraines Maternal Grandmother   . Cancer Maternal Grandmother   . Breast cancer Maternal Grandmother   . Hypertension Father   . Diabetes Father   . Coronary artery disease Father   . Migraines Paternal Grandmother   . Breast cancer Paternal Grandmother   . Cancer Mother     breast  . Hemochromatosis Mother   . Breast cancer Mother   . Coronary artery disease Paternal Grandfather    History  Substance Use Topics  . Smoking status: Former Smoker -- 0.50 packs/day for 5 years    Types:  Cigarettes    Quit date: 01/07/2014  . Smokeless tobacco: Never Used  . Alcohol Use: No   OB History    Gravida Para Term Preterm AB TAB SAB Ectopic Multiple Living   '3 2 1 1 1  1   2     '$ Review of Systems  Constitutional: Positive for fatigue. Negative for fever and chills.  HENT: Negative for congestion.   Eyes: Negative for visual disturbance.  Respiratory: Negative for shortness of breath.   Cardiovascular: Negative for chest pain.  Gastrointestinal: Positive for nausea. Negative for vomiting and abdominal pain.  Genitourinary: Negative for dysuria and flank pain.  Musculoskeletal: Positive for back pain. Negative for neck pain and neck stiffness.  Skin: Negative for rash.  Neurological: Positive for weakness and numbness. Negative for light-headedness and headaches.      Allergies  Gabapentin; Metoclopramide hcl; Topamax; Codeine; Zofran; Latex; and Tape  Home Medications   Prior to Admission medications   Medication Sig Start Date End Date Taking? Authorizing Provider  clonazePAM (KLONOPIN) 0.5 MG tablet Take 1 tablet (0.5 mg total) by mouth 2 (two) times daily as needed for anxiety. 12/26/14  Yes Kathyrn Drown, MD  DULoxetine (CYMBALTA) 60 MG capsule Take 1 capsule (60 mg total) by mouth daily. 12/26/14  Yes Kathyrn Drown, MD  pregabalin (LYRICA) 100 MG capsule Take 100-200 mg by mouth 2 (two) times daily. 1 capsule in the morning, 2 capsules at night   Yes Historical Provider, MD  HYDROcodone-acetaminophen (NORCO) 5-325 MG per tablet Take 1-2 tablets by mouth every 4 (four) hours as needed. 12/30/14   Mariea Clonts, MD  promethazine (PHENERGAN) 25 MG tablet Take 1 tablet (25 mg total) by mouth every 6 (six) hours as needed for nausea. Patient not taking: Reported on 12/30/2014 12/26/14   Kathyrn Drown, MD  promethazine (PHENERGAN) 25 MG tablet Take 1 tablet (25 mg total) by mouth every 8 (eight) hours as needed for nausea or vomiting. 12/30/14   Mariea Clonts, MD   zolpidem (AMBIEN) 5 MG tablet Take 1 tablet (5 mg total) by mouth at bedtime as needed for sleep. 12/26/14   Kathyrn Drown, MD   BP 157/62 mmHg  Pulse 84  Temp(Src) 98 F (36.7 C) (Oral)  Resp 18  Ht $R'5\' 6"'UU$  (1.676 m)  Wt 250 lb (113.399 kg)  BMI 40.37 kg/m2  SpO2 97%  LMP 12/19/2014  Breastfeeding? No Physical Exam  Constitutional: She is oriented to person, place, and time. She appears well-developed and well-nourished.  HENT:  Head: Normocephalic and atraumatic.  Eyes: Conjunctivae are normal. Right eye exhibits no discharge. Left eye exhibits no discharge.  Neck: Normal range of  motion. Neck supple. No tracheal deviation present.  Cardiovascular: Normal rate and regular rhythm.   Pulmonary/Chest: Effort normal and breath sounds normal.  Abdominal: Soft. She exhibits no distension. There is no tenderness. There is no guarding.  Musculoskeletal: She exhibits tenderness. She exhibits no edema.  Patient has moderate tenderness lower lumbar midline and right paraspinal and buttocks area to palpation. Patient has tenderness in the back for straight leg with raising of the right leg  Neurological: She is alert and oriented to person, place, and time. GCS eye subscore is 4. GCS verbal subscore is 5. GCS motor subscore is 6.  Reflex Scores:      Patellar reflexes are 1+ on the right side and 1+ on the left side.      Achilles reflexes are 1+ on the right side and 1+ on the left side. Difficult exam due to pain. Patient has mild weakness in the right lower extremity with flexion of the hip and dorsiflexion of the great toe. Equal strength flexion and knee bilateral. 5+ strength left lower extremity flexion extension at major joints. Patient has equal strength bilateral upper extremities.  Skin: Skin is warm. No rash noted.  Psychiatric: She has a normal mood and affect.  Nursing note and vitals reviewed.   ED Course  Procedures (including critical care time) Labs Review Labs Reviewed   BASIC METABOLIC PANEL - Abnormal; Notable for the following:    Glucose, Bld 101 (*)    All other components within normal limits  CBC WITH DIFFERENTIAL/PLATELET - Abnormal; Notable for the following:    Hemoglobin 9.3 (*)    HCT 30.0 (*)    MCV 75.2 (*)    MCH 23.3 (*)    RDW 15.7 (*)    All other components within normal limits  URINALYSIS, ROUTINE W REFLEX MICROSCOPIC  PREGNANCY, URINE    Imaging Review Mr Lumbar Spine Wo Contrast  12/30/2014   CLINICAL DATA:  Sciatica. Lower extremity pain and spasms with multiple falls in the past 24 hr.  EXAM: MRI LUMBAR SPINE WITHOUT CONTRAST  TECHNIQUE: Multiplanar, multisequence MR imaging of the lumbar spine was performed. No intravenous contrast was administered.  COMPARISON:  07/30/2014  FINDINGS: Vertebral alignment is normal. Vertebral body heights are preserved. No vertebral marrow edema is seen. Disc desiccation and very mild disc space narrowing are present at L3-4 and L4-5, similar to prior. Other intervertebral discs are well hydrated and maintained in height. Conus medullaris is normal in signal and terminates at L1. 3.5 cm left ovarian cyst is partially visualized.  L1-2:  Negative.  L2-3:  Minimal facet hypertrophy.  No disc herniation or stenosis.  L3-4: Small right central disc protrusion with annular fissure appears mildly larger/more focal than on the prior study. This approaches but does not clearly displace the traversing right L4 nerve root. No spinal stenosis or neural foraminal stenosis. Mild facet hypertrophy.  L4-5: Shallow right central disc protrusion with annular fissure is similar to the prior study, in close proximity to the traversing right L5 nerve root without clear impingement. No spinal canal or neural foraminal stenosis. Mild facet hypertrophy.  L5-S1:  Mild facet hypertrophy.  No disc herniation or stenosis.  IMPRESSION: 1. Small right central disc protrusion at L3-4, larger than on the prior study but without evidence  of neural impingement. 2. Unchanged, shallow disc protrusion at L4-5 without stenosis.   Electronically Signed   By: Sebastian Ache   On: 12/30/2014 12:44     EKG Interpretation None  MDM   Final diagnoses:  Sciatica  Lumbar disc herniation with radiculopathy  Anemia  Patient presents after being sent by neurology for further workup of worsening symptoms and falls. Difficulty controlling patient's pain in the ER. This is similar to previous but worse. MRI ordered for further delineation suspecting worsening of her known small disc bulge on the right.  Patient improved significantly in the ER, patient required multiple pain meds requiring MRI to look for worsening disease. Patient has small disc protrusion. Discussed continued outpatient follow-up with neurology, patient likely will need physical therapy, possibly referral for pain management if pain is not being controlled outpatient. Patient was able to walk and use the bathroom in the ER.  Results and differential diagnosis were discussed with the patient/parent/guardian. Close follow up outpatient was discussed, comfortable with the plan.   Medications  HYDROcodone-acetaminophen (NORCO/VICODIN) 5-325 MG per tablet 2 tablet (not administered)  ketorolac (TORADOL) 15 MG/ML injection 15 mg (15 mg Intravenous Given 12/30/14 0952)  HYDROmorphone (DILAUDID) injection 1 mg (1 mg Intravenous Given 12/30/14 0953)  sodium chloride 0.9 % bolus 500 mL (0 mLs Intravenous Stopped 12/30/14 1050)  promethazine (PHENERGAN) injection 25 mg (25 mg Intravenous Given 12/30/14 1051)  HYDROmorphone (DILAUDID) injection 1 mg (1 mg Intravenous Given 12/30/14 1053)    Filed Vitals:   12/30/14 0930 12/30/14 1030 12/30/14 1100 12/30/14 1248  BP: 141/118 141/93 157/62   Pulse: 128 107 134 84  Temp:    98 F (36.7 C)  TempSrc:    Oral  Resp:    18  Height:      Weight:      SpO2: 98% 94% 97% 97%    Final diagnoses:  Sciatica  Lumbar disc herniation  with radiculopathy        Mariea Clonts, MD 12/30/14 1351

## 2014-12-30 NOTE — Discharge Instructions (Signed)
If you were given medicines take as directed.  If you are on coumadin or contraceptives realize their levels and effectiveness is altered by many different medicines.  If you have any reaction (rash, tongues swelling, other) to the medicines stop taking and see a physician.   Please follow up as directed and return to the ER or see a physician for new or worsening symptoms.  Thank you. Filed Vitals:   12/30/14 0930 12/30/14 1030 12/30/14 1100 12/30/14 1248  BP: 141/118 141/93 157/62   Pulse: 128 107 134 84  Temp:    98 F (36.7 C)  TempSrc:    Oral  Resp:    18  Height:      Weight:      SpO2: 98% 94% 97% 97%

## 2014-12-31 ENCOUNTER — Telehealth: Payer: Self-pay | Admitting: Neurology

## 2014-12-31 NOTE — Telephone Encounter (Signed)
Patient is calling because she was talking with someone moments ago but does not know who.  Please call.

## 2014-12-31 NOTE — Telephone Encounter (Signed)
I called the patient. The patient states that she is having a lot of low back pain, numbness in the legs, she is using a walker for ambulation of the last 3 days. I will try to get up earlier work in appointment for her.

## 2014-12-31 NOTE — Telephone Encounter (Signed)
Spoke to patient and she went to ER yesterday, MRI was done.  She relayed that she is having lower back pain, and numbness in legs.  She is falling and now using a walker.  She would like to speak to doctor.

## 2015-01-01 ENCOUNTER — Telehealth: Payer: Self-pay | Admitting: Neurology

## 2015-01-01 NOTE — Telephone Encounter (Signed)
Spoke to patient and she is scheduled for tomorrow 01-02-15 at 8am.

## 2015-01-01 NOTE — Telephone Encounter (Signed)
Spoke to patient she is scheduled for tomorrow 01-02-15 at 8am.

## 2015-01-01 NOTE — Telephone Encounter (Signed)
Pt is calling stating that she spoke with Dr. [name] and he wanted her to be seen as soon as possible.  She has appointment in May.  I could not find a sooner appointment.  Please advise.

## 2015-01-02 ENCOUNTER — Encounter: Payer: Self-pay | Admitting: Neurology

## 2015-01-02 ENCOUNTER — Ambulatory Visit (INDEPENDENT_AMBULATORY_CARE_PROVIDER_SITE_OTHER): Payer: Medicaid Other | Admitting: Neurology

## 2015-01-02 VITALS — BP 145/63 | HR 103 | Ht 66.0 in | Wt 251.6 lb

## 2015-01-02 DIAGNOSIS — R29898 Other symptoms and signs involving the musculoskeletal system: Secondary | ICD-10-CM | POA: Insufficient documentation

## 2015-01-02 DIAGNOSIS — R269 Unspecified abnormalities of gait and mobility: Secondary | ICD-10-CM | POA: Insufficient documentation

## 2015-01-02 MED ORDER — TRAMADOL HCL 50 MG PO TABS: 50.0000 mg | ORAL_TABLET | Freq: Four times a day (QID) | ORAL | Status: DC | PRN

## 2015-01-02 NOTE — Patient Instructions (Signed)

## 2015-01-02 NOTE — Progress Notes (Signed)
Reason for visit: Walking difficulty  Penny [name] is an 29 y.o. 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 The patient has had scanning procedures of her entire neural axis including the brain, cervical spine, thoracic spine, and lumbar spine. These studies have not shown any significant abnormalities. The patient has recently developed within the last 5 days onset of weakness in both legs, tingling sensations from the hip levels down, right greater than left. The patient believes that the right leg is weaker than the left. She denies any issues controlling the bowels or the bladder. The patient will collapse with her legs, and she is now using a walker for ambulation. She indicates that she has a lot of back pain and pain down the legs. She describes the pain as a shooting sensation. She indicates that the right-sided sensory alteration has improved somewhat. She comes back to this office for an evaluation.  Past Medical History  Diagnosis Date  . Migraines   . Interstitial cystitis   . IUD FEB 2010  . Gastritis JULY 2011  . Depression   . 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 Fatty liver   . Obesity (BMI 30-39.9) 2011 228 LBS BMI 36.8  . Iron deficiency anemia 07/23/2010  . Irritable bowel syndrome 2012 DIARRHEA    JUN 2012 TTG IgA 14.9  . Elevated liver enzymes JUL 2011ALK PHOS 111-127 AST  143-267 ALT  213-321T BILI 0.6  ALB  3.7-4.06 Jun 2011 ALK PHOS 118 AST 24 ALT 42 T BILI 0.4 ALB 3.9  . Chronic daily headache   . Ovarian cyst   . PONV (postoperative nausea and vomiting)   . ADD (attention deficit disorder)   . RLQ abdominal pain 07/18/2013  . Menorrhagia 07/18/2013  . Anxiety   . Potassium (K) deficiency   . Dysmenorrhea 09/18/2013  . Stress 09/18/2013  . Patient  desires pregnancy 09/18/2013  . Pregnant 12/25/2013  . Blood transfusion without reported diagnosis   . Heart murmur   . Gastric bypass status for obesity   . 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 Dysrhythmia   . Sciatica of left side 11/14/2014  . Fibromyalgia     Past Surgical History  Procedure Laterality Date  . Gab  2007    in [place]-POUCH 5 CM  . Cholecystectomy  2005    biliary dyskinesia  . Cath self every nite      for sodium bicarb injection (discontinued 2013)  . Colonoscopy  JUN 2012 ABD PN/DIARRHEA WITH PROPOFOL    NL COLON  . Upper gastrointestinal endoscopy  JULY 2011 NAUSEA-D125,V6, PH 25    Bx; GASTRITIS, POUCH-5 CM LONG  . Dilation and curettage of uterus    . Tonsillectomy    . Gastric bypass  06/2006  . Savory dilation  06/20/2012    ZDG:UYQI gastritis/Ulcer in the mid jejunum  . Tonsillectomy and adenoidectomy    . Esophagogastroduodenoscopy    . Wisdom tooth extraction    . Repair vaginal cuff N/A 07/30/2014    Procedure: REPAIR VAGINAL CUFF;  Surgeon: Mora Bellman, MD;  Location: [facility];  Service: Gynecology;  Laterality: N/A;  . Hysteroscopy w/d&c N/A 09/12/2014    Procedure: DILATATION AND CURETTAGE /HYSTEROSCOPY;  Surgeon:  Tilda Burrow, MD;  Location: [facility];  Service: Gynecology;  Laterality: N/A;    Family History  Problem Relation Age of Onset  . Hemochromatosis Maternal Grandmother   . Migraines Maternal Grandmother   . Cancer Maternal Grandmother   . Breast cancer Maternal Grandmother   . Hypertension Father   . Diabetes Father   . Coronary artery disease Father   . Migraines Paternal Grandmother   . Breast cancer Paternal Grandmother   . Cancer Mother     breast  . Hemochromatosis Mother   . Breast cancer Mother   . Coronary artery disease Paternal Grandfather     Social history:  reports that she quit smoking about a year ago. Her smoking use included Cigarettes. She has a 2.5 pack-year smoking history. She has never used  smokeless tobacco. She reports that she does not drink alcohol or use illicit drugs.    Allergies  Allergen Reactions  . Gabapentin Other (See Comments)    Pt states that she was unresponsive after taking this medication, but her vitals remained stable.    . Metoclopramide Hcl Anxiety and Other (See Comments)    Pt states that she felt like she was trapped in a box, and could not get out.  Pt also states that she had temporary loss of movement, weakness, and tingling.    . Topamax [Topiramate] Shortness Of Breath and Other (See Comments)    Adverse side effects.  Pt states this medication changed her taste for a short period of time and caused numbness and tingling in extremities.    . Codeine Nausea And Vomiting  . Zofran [Ondansetron] Other (See Comments)    MIGRAINES  . Latex Rash  . Tape Itching and Rash    Medications:  Current Outpatient Prescriptions on File Prior to Visit  Medication Sig Dispense Refill  . clonazePAM (KLONOPIN) 0.5 MG tablet Take 1 tablet (0.5 mg total) by mouth 2 (two) times daily as needed for anxiety. 30 tablet 2  . DULoxetine (CYMBALTA) 60 MG capsule Take 1 capsule (60 mg total) by mouth daily. 30 capsule 3  . pregabalin (LYRICA) 100 MG capsule Take 200 mg by mouth 2 (two) times daily.     . promethazine (PHENERGAN) 25 MG tablet Take 1 tablet (25 mg total) by mouth every 8 (eight) hours as needed for nausea or vomiting. 10 tablet 0  . zolpidem (AMBIEN) 5 MG tablet Take 1 tablet (5 mg total) by mouth at bedtime as needed for sleep. 30 tablet 2   No current facility-administered medications on file prior to visit.    ROS:  Out of a complete 14 system review of symptoms, the patient complains only of the following symptoms, and all other reviewed systems are negative.  Excessive sweating Ringing in the ears Eye discharge, eye itching, light sensitivity Leg swelling Restless legs, insomnia Frequent infections Joint pain, back pain, muscle cramps,  walking difficulty Anemia Headache, numbness, weakness Depression, anxiety  Blood pressure 145/63, pulse 103, height 5\' 6"  (1.676 m), weight 251 lb 9.6 oz (114.125 kg), last menstrual period 12/19/2014, not currently breastfeeding.  Physical Exam  General: The patient is alert and cooperative at the time of the examination. The patient is moderately obese.  Skin: No significant peripheral edema is noted.   Neurologic Exam  Mental status: The patient is oriented x 3.  Cranial nerves: Facial symmetry is present. Speech is normal, no aphasia or dysarthria is noted. Extraocular movements are full. Visual fields are full.  Motor: The patient has good strength in all 4 extremities, but there is giveaway weakness throughout the legs bilaterally, right greater than left. The patient is able to walk on heels and the toes bilaterally.  Sensory examination: Pinprick sensation is symmetric on the face, decreased on the right arm relative to the left. The patient has decreased pinprick sensation on both legs, right greater than left. There is a sensory level on the back on the right side around the T10 level, not present on the left.  Coordination: The patient has good finger-nose-finger and heel-to-shin bilaterally.  Gait and station: The patient has a slow delivered gait, the patient came into the office using the walker, but she is able to walk independently. She is able to perform tandem gait, with occasional partial collapse of the right leg, without falls. Romberg is negative. No drift is seen.  Reflexes: Deep tendon reflexes are symmetric, and are normal.   MRI lumbar 12/30/14:  IMPRESSION: 1. Small right central disc protrusion at L3-4, larger than on the prior study but without evidence of neural impingement. 2. Unchanged, shallow disc protrusion at L4-5 without stenosis.   MRI cervical 12/31/14:  IMPRESSION:  Unremarkable MRI cervical spine (without) demonstrating: 1. Minimal  degenerative changes from C3-4 to C6-7. No spinal stenosis or foraminal narrowing. 2. No change from MRI on 09/27/12.    Assessment/Plan:  1. Pseudoseizures  2. Gait disorder, new onset  Clinical examination shows giveaway weakness in both legs, otherwise with normal reflexes. The patient will be evaluated with nerve conduction studies of both legs, and EMG evaluation of the right leg. The patient has just recently had MRI evaluation of the lumbar spine and of the cervical spine which did not show any etiology for her new symptoms. Prior MRI of the thoracic spine in August 2015 was unremarkable. The patient once again may have psychogenic symptoms. A prescription was given for Ultram for the discomfort. The patient is already on Lyrica and Cymbalta.  Jill Alexanders MD 01/02/2015 8:34 PM  [facility] [address] [place], [place] [address]  Phone [phone] Fax [phone]

## 2015-01-03 ENCOUNTER — Telehealth: Payer: Self-pay | Admitting: Family Medicine

## 2015-01-03 ENCOUNTER — Telehealth: Payer: Self-pay | Admitting: Neurology

## 2015-01-03 MED ORDER — KETOROLAC TROMETHAMINE 10 MG PO TABS: 10.0000 mg | ORAL_TABLET | Freq: Three times a day (TID) | ORAL | Status: DC | PRN

## 2015-01-03 MED ORDER — TIZANIDINE HCL 2 MG PO CAPS: 2.0000 mg | ORAL_CAPSULE | Freq: Three times a day (TID) | ORAL | Status: DC | PRN

## 2015-01-03 NOTE — Telephone Encounter (Signed)
Spoke to patient and she relayed that she woke up with a headache and it's turning into a migraine.  Pain level about and 8.  She also stated that her back is still hurting, Tramadol is not helping.

## 2015-01-03 NOTE — Telephone Encounter (Signed)
Patient stated Rx traMADol (ULTRAM) 50 MG tablet isn't helping with Migraines and she has a severe Migraine.  Please call and advise.

## 2015-01-03 NOTE — Telephone Encounter (Signed)
I called the patient. I will call in a prescription for Toradol.

## 2015-01-03 NOTE — Telephone Encounter (Signed)
Pt called stating that her neurologist put her on torodol yesterday For her migraines and it didn't work. She stated that she called  Them today but they are closed. Pt wants to know if Penny [name] can  Prescribe her something for this weekend.

## 2015-01-03 NOTE — Telephone Encounter (Signed)
Zanaflex 2 mg , 1 tid prn, #20. ( I can not Rx percocet for this) follow up with neurology

## 2015-01-03 NOTE — Telephone Encounter (Signed)
Patient was started on Tramadol by Neurologist- see phone message Neurology

## 2015-01-03 NOTE — Telephone Encounter (Signed)
Rx sent electronically to pharmacy. Patient notified. 

## 2015-01-06 ENCOUNTER — Telehealth: Payer: Self-pay | Admitting: *Deleted

## 2015-01-06 ENCOUNTER — Telehealth: Payer: Self-pay | Admitting: Family Medicine

## 2015-01-06 NOTE — Telephone Encounter (Signed)
Nurses, this patient saw neurology. They recommended tramadol for her headache. If that isn't helping her she truly should call them back. I cannot prescribe anything stronger. I prescribed Zanaflex. If necessary she can use an anti-inflammatory. But I am not going to prescribe narcotic.

## 2015-01-06 NOTE — Telephone Encounter (Signed)
Notified patient she saw neurology. They recommended tramadol for her headache. If that isn't helping her she truly should call them back. Dr. Nicki Reaper cannot prescribe anything stronger. Dr. Nicki Reaper prescribed Zanaflex. If necessary she can use an anti-inflammatory. But he is not going to prescribe narcotic. Patient verbalized understanding.

## 2015-01-06 NOTE — Telephone Encounter (Signed)
clonazePAM (KLONOPIN) 0.5 MG tablet  Pt stating that this Med is not working for her, she has taken 2 at a time an not doing what  She feels it needs to do.   Explained that we are short staffed, that we will answer her as soon as possible. She was told that  She would most likely need to bring back the original script to be counted and turned in if you decide to  Change this med for her.   Pt understood it would be late tonight or tomorrow before she heard anything

## 2015-01-09 ENCOUNTER — Telehealth: Payer: Self-pay | Admitting: Obstetrics and Gynecology

## 2015-01-09 ENCOUNTER — Other Ambulatory Visit: Payer: Self-pay | Admitting: Obstetrics and Gynecology

## 2015-01-09 ENCOUNTER — Ambulatory Visit: Payer: Medicaid Other | Admitting: Obstetrics and Gynecology

## 2015-01-09 DIAGNOSIS — N911 Secondary amenorrhea: Secondary | ICD-10-CM

## 2015-01-09 MED ORDER — MEDROXYPROGESTERONE ACETATE 10 MG PO TABS: 10.0000 mg | ORAL_TABLET | Freq: Every day | ORAL | Status: DC

## 2015-01-09 NOTE — Progress Notes (Signed)
rx'd provera x 10 d to attempt to bring on menses.

## 2015-01-09 NOTE — Telephone Encounter (Signed)
Pt states that she just needs Dr. Glo Herring to call in medication for pt to start her period.

## 2015-01-15 ENCOUNTER — Encounter: Payer: Self-pay | Admitting: Neurology

## 2015-01-15 ENCOUNTER — Ambulatory Visit (INDEPENDENT_AMBULATORY_CARE_PROVIDER_SITE_OTHER): Payer: Self-pay | Admitting: Neurology

## 2015-01-15 ENCOUNTER — Ambulatory Visit (INDEPENDENT_AMBULATORY_CARE_PROVIDER_SITE_OTHER): Payer: Medicaid Other | Admitting: Neurology

## 2015-01-15 DIAGNOSIS — R269 Unspecified abnormalities of gait and mobility: Secondary | ICD-10-CM

## 2015-01-15 DIAGNOSIS — R29898 Other symptoms and signs involving the musculoskeletal system: Secondary | ICD-10-CM

## 2015-01-15 DIAGNOSIS — M5432 Sciatica, left side: Secondary | ICD-10-CM

## 2015-01-15 DIAGNOSIS — G609 Hereditary and idiopathic neuropathy, unspecified: Secondary | ICD-10-CM

## 2015-01-15 HISTORY — DX: Hereditary and idiopathic neuropathy, unspecified: G60.9

## 2015-01-15 NOTE — Progress Notes (Signed)
Please refer to EMG and NCV procedure note. 

## 2015-01-15 NOTE — Progress Notes (Signed)
Penny [name] is a 29 year old patient with a history of pseudoseizures who now presents with back pain and bilateral lower extremity numbness. The patient being evaluated for possible neuropathy or radiculopathy.  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 However, EMG evaluation of the left leg is completely normal.  Blood work will be done today to evaluate for the etiology of the neuropathy. If this is unremarkable, lumbar puncture may be done to exclude the possibility of a CIDP neuropathy.  The patient will follow-up in about 3 months.

## 2015-01-15 NOTE — Procedures (Signed)
     HISTORY:  Penny [name] is a 29 year old patient with a history of back pain, numbness down both legs from the hip level to the feet. The patient has had increased symptoms over the last 2 weeks. She is being evaluated for a possible neuropathy or a lumbosacral radiculopathy.  NERVE CONDUCTION STUDIES:  Nerve conduction studies were performed on both lower extremities. The distal motor latency for left peroneal nerve was prolonged, with a low motor amplitude. The distal motor latency for the right peroneal nerve was normal, with a normal motor amplitude, but there was a drop-off in motor amplitudes with more proximal stimulation. Slowing was seen for the peroneal nerves bilaterally. The posterior tibial nerves revealed normal distal motor latencies bilaterally, with low motor amplitudes for these nerves bilaterally. The nerve conduction velocities for the left posterior tibial nerve was slowed, normal for the right posterior tibial nerve. The F wave latencies for the peroneal nerves were prolonged on the right, absent on the left, and prolonged for the posterior tibial nerves bilaterally. The sural and peroneal sensory latencies were unobtainable bilaterally.  EMG STUDIES:  EMG study was performed on the left lower extremity:  The tibialis anterior muscle reveals 2 to 4K motor units with full recruitment. No fibrillations or positive waves were seen. The peroneus tertius muscle reveals 2 to 4K motor units with full recruitment. No fibrillations or positive waves were seen. The medial gastrocnemius muscle reveals 1 to 3K motor units with full recruitment. No fibrillations or positive waves were seen. The vastus lateralis muscle reveals 2 to 4K motor units with full recruitment. No fibrillations or positive waves were seen. The iliopsoas muscle reveals 2 to 4K motor units with full recruitment. No fibrillations or positive waves were seen. The biceps femoris muscle (long head) reveals 2 to 4K  motor units with full recruitment. No fibrillations or positive waves were seen. The lumbosacral paraspinal muscles were tested at 3 levels, and revealed no abnormalities of insertional activity at all 3 levels tested. There was good relaxation.   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 EMG evaluation of the left lower extremity, however, was completely normal. The EMG study was suggest that the neuropathy issues are less severe than the nerve conduction study would indicate.  Jill Alexanders MD 01/15/2015 11:20 AM  [facility] [address] [place], [place] [address]  Phone [phone] Fax [phone]

## 2015-01-16 ENCOUNTER — Telehealth: Payer: Self-pay | Admitting: Neurology

## 2015-01-16 MED ORDER — TIZANIDINE HCL 2 MG PO CAPS: 2.0000 mg | ORAL_CAPSULE | Freq: Three times a day (TID) | ORAL | Status: DC | PRN

## 2015-01-16 NOTE — Telephone Encounter (Signed)
I called the patient. The patient is having spasms, in the past, tizanidine has been helpful, I will call in a prescription for this.

## 2015-01-16 NOTE — Telephone Encounter (Signed)
Patient is calling because Rx's Tramadol and Ketorolac has not helped with spasms in patient's back and legs. Is there another Rx that can help? Tizanidine has helped patient in the past. Please call patient and advise. [facility].

## 2015-01-17 ENCOUNTER — Telehealth: Payer: Self-pay | Admitting: *Deleted

## 2015-01-17 NOTE — Telephone Encounter (Signed)
I called the patient. The patient is having blurring of vision, this likely is a medication side effect from Lyrica and Cymbalta. Going off the medications may help the side effects, but if the medication is helping her pain, I would stay on the drugs. The blood work so far is unremarkable with exception of a hemoglobin A1c of 5.7. The cryoglobulin antibodies is still pending. If the blood work remains normal, we may consider lumbar puncture.

## 2015-01-17 NOTE — Telephone Encounter (Signed)
Patient is having trouble with vision and does not remember when it started. Patient is also having headaches, leg pain and back pain. Patient vision is really blurry see really can not see. Please call and advise

## 2015-01-20 LAB — IFE AND PE, SERUM
ALBUMIN SERPL ELPH-MCNC: 3.8 g/dL (ref 3.2–5.6)
ALBUMIN/GLOB SERPL: 1.4 (ref 0.7–2.0)
ALPHA2 GLOB SERPL ELPH-MCNC: 0.8 g/dL (ref 0.4–1.2)
Alpha 1: 0.2 g/dL (ref 0.1–0.4)
B-Globulin SerPl Elph-Mcnc: 0.9 g/dL (ref 0.6–1.3)
Gamma Glob SerPl Elph-Mcnc: 1 g/dL (ref 0.5–1.6)
Globulin, Total: 2.9 g/dL (ref 2.0–4.5)
IGA/IMMUNOGLOBULIN A, SERUM: 138 mg/dL (ref 91–414)
IGM (IMMUNOGLOBULIN M), SRM: 215 mg/dL (ref 40–230)
IgG (Immunoglobin G), Serum: 772 mg/dL (ref 700–1600)
Total Protein: 6.7 g/dL (ref 6.0–8.5)

## 2015-01-20 LAB — ANGIOTENSIN CONVERTING ENZYME: Angio Convert Enzyme: 41 U/L (ref 14–82)

## 2015-01-20 LAB — HEMOGLOBIN A1C
Est. average glucose Bld gHb Est-mCnc: 117 mg/dL
Hgb A1c MFr Bld: 5.7 % — ABNORMAL HIGH (ref 4.8–5.6)

## 2015-01-20 LAB — SEDIMENTATION RATE: Sed Rate: 25 mm/h (ref 0–32)

## 2015-01-20 LAB — VITAMIN B12: Vitamin B-12: 321 pg/mL (ref 211–946)

## 2015-01-20 LAB — HIV ANTIBODY (ROUTINE TESTING W REFLEX): HIV Screen 4th Generation wRfx: NONREACTIVE

## 2015-01-20 LAB — CRYOGLOBULIN, QL, SERUM, RFLX

## 2015-01-20 LAB — RHEUMATOID FACTOR: Rheumatoid fact SerPl-aCnc: 11.5 [IU]/mL (ref 0.0–13.9)

## 2015-01-20 LAB — ANA W/REFLEX: Anti Nuclear Antibody(ANA): NEGATIVE

## 2015-01-20 LAB — B. BURGDORFI ANTIBODIES: Lyme IgG/IgM Ab: <0.91 {ISR} (ref 0.00–0.90)

## 2015-01-21 ENCOUNTER — Telehealth: Payer: Self-pay | Admitting: Obstetrics and Gynecology

## 2015-01-21 ENCOUNTER — Telehealth: Payer: Self-pay | Admitting: Neurology

## 2015-01-21 ENCOUNTER — Other Ambulatory Visit: Payer: Self-pay | Admitting: Obstetrics and Gynecology

## 2015-01-21 DIAGNOSIS — G609 Hereditary and idiopathic neuropathy, unspecified: Secondary | ICD-10-CM

## 2015-01-21 DIAGNOSIS — G6181 Chronic inflammatory demyelinating polyneuritis: Secondary | ICD-10-CM

## 2015-01-21 DIAGNOSIS — N911 Secondary amenorrhea: Secondary | ICD-10-CM

## 2015-01-21 MED ORDER — NORGESTIMATE-ETH ESTRADIOL 0.25-35 MG-MCG PO TABS
1.0000 | ORAL_TABLET | Freq: Every day | ORAL | Status: DC
Start: 2015-01-21 — End: 2015-02-18

## 2015-01-21 NOTE — Telephone Encounter (Signed)
Pt states that she has taken all of her medication and still has not started her period. Pt wants to know what she can do now. I advised the pt that I would speak to Dr. Glo Herring and see what he wants to do now.

## 2015-01-21 NOTE — Telephone Encounter (Signed)
I called patient. She is amenable to have the lumbar puncture to evaluate her for possible CIDP. I will get this set up.

## 2015-01-21 NOTE — Telephone Encounter (Signed)
Spoke to patient to relay blood work results and patient wanted to know what is the next step.  She wanted to know about a spinal tap?

## 2015-01-24 ENCOUNTER — Telehealth: Payer: Self-pay | Admitting: *Deleted

## 2015-01-24 MED ORDER — CYCLOBENZAPRINE HCL 5 MG PO TABS
5.0000 mg | ORAL_TABLET | Freq: Three times a day (TID) | ORAL | Status: DC | PRN
Start: 2015-01-24 — End: 2015-01-29

## 2015-01-24 NOTE — Telephone Encounter (Signed)
I called the patient. She indicates that the tizanidine makes her too drowsy. She indicates that Flexeril is better tolerated, I'll switch her to 5 mg 3 times daily of Flexeril.

## 2015-01-24 NOTE — Telephone Encounter (Signed)
Tizanidine makes the patient to sleepy. The patient walks to know if she can have Flexeril instead. Please call patient at  [phone]

## 2015-01-29 ENCOUNTER — Telehealth: Payer: Self-pay | Admitting: Neurology

## 2015-01-29 ENCOUNTER — Other Ambulatory Visit: Payer: Self-pay | Admitting: Neurology

## 2015-01-29 ENCOUNTER — Ambulatory Visit
Admission: RE | Admit: 2015-01-29 | Discharge: 2015-01-29 | Disposition: A | Discharge status: Home / Self Care | Payer: Medicaid Other | Source: Ambulatory Visit | Attending: Neurology | Admitting: Neurology

## 2015-01-29 DIAGNOSIS — G6181 Chronic inflammatory demyelinating polyneuritis: Secondary | ICD-10-CM

## 2015-01-29 DIAGNOSIS — F17200 Nicotine dependence, unspecified, uncomplicated: Secondary | ICD-10-CM | POA: Insufficient documentation

## 2015-01-29 DIAGNOSIS — R269 Unspecified abnormalities of gait and mobility: Secondary | ICD-10-CM

## 2015-01-29 DIAGNOSIS — G609 Hereditary and idiopathic neuropathy, unspecified: Secondary | ICD-10-CM

## 2015-01-29 DIAGNOSIS — Z720 Tobacco use: Secondary | ICD-10-CM | POA: Insufficient documentation

## 2015-01-29 LAB — PROTEIN, CSF: Total Protein, CSF: 21 mg/dL (ref 15–45)

## 2015-01-29 LAB — CSF CELL COUNT WITH DIFFERENTIAL
RBC Count, CSF: 0 cu mm
Tube #: 3
WBC, CSF: 0 cu mm (ref 0–5)

## 2015-01-29 LAB — GLUCOSE, CSF: Glucose, CSF: 58 mg/dL (ref 43–76)

## 2015-01-29 MED ORDER — PROMETHAZINE HCL 25 MG/ML IJ SOLN
12.5000 mg | Freq: Once | INTRAMUSCULAR | Status: AC
  Administered 2015-01-29: 12.5 mg via INTRAMUSCULAR

## 2015-01-29 MED ORDER — HYDROMORPHONE HCL 1 MG/ML IJ SOLN
1.0000 mg | Freq: Once | INTRAMUSCULAR | Status: AC
  Administered 2015-01-29: 1 mg via INTRAMUSCULAR

## 2015-01-29 MED ORDER — TIZANIDINE HCL 4 MG PO TABS: 4.0000 mg | ORAL_TABLET | Freq: Three times a day (TID) | ORAL | Status: DC

## 2015-01-29 MED ORDER — TOPIRAMATE 25 MG PO TABS: ORAL_TABLET | ORAL | Status: DC

## 2015-01-29 MED ORDER — DIAZEPAM 5 MG PO TABS
10.0000 mg | ORAL_TABLET | Freq: Once | ORAL | Status: AC
  Administered 2015-01-29: 10 mg via ORAL

## 2015-01-29 NOTE — Telephone Encounter (Signed)
I called patient. The spinal fluid was unremarkable. 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

## 2015-01-29 NOTE — Discharge Instructions (Signed)
Lumbar Puncture Discharge Instructions ° °1. Go home and rest quietly for the next 24 hours.  It is important to lie flat for the next 24 hours.  Get up only to go to the restroom.  You may lie in the bed or on a couch on your back, your stomach, your left side or your right side.  You may have one pillow under your head.  You may have pillows between your knees while you are on your side or under your knees while you are on your back. ° °2. DO NOT drive today.  Recline the seat as far back as it will go, while still wearing your seat belt, on the way home. ° °3. You may get up to go to the bathroom as needed.  You may sit up for 10 minutes to eat.  You may resume your normal diet and medications unless otherwise indicated.  Drink plenty of extra fluids today and tomorrow. ° °4. The incidence of a spinal headache with nausea and/or vomiting is about 5% (one in 20 patients).  If you develop a headache, lie flat and drink plenty of fluids until the headache goes away.  Caffeinated beverages may be helpful.  If you develop severe nausea and vomiting or a headache that does not go away with flat bed rest, please call the physician who sent you here. ° °5. You may resume normal activities after your 24 hours of bed rest is over; however, do not exert yourself strongly or do any heavy lifting tomorrow.  Please call us at [phone] with any questions or concerns. ° °6. Call your physician for a follow-up appointment.  °

## 2015-01-29 NOTE — Progress Notes (Signed)
Patient medicated for pain (crying, tearful) in her lower back radiating down into legs, left worse than right.  Dr. Jeralyn Ruths in to speak with patient, too.  Husband brought to bedside.  jkl

## 2015-01-29 NOTE — Telephone Encounter (Signed)
I called patient. The patient is gaining too much weight on the Lyrica. She will taper by 100 mg every week until off the medication. The patient will be placed on Topamax. The tizanidine dose will be increased to a 4 mg 3 times daily dose.

## 2015-01-30 NOTE — Telephone Encounter (Signed)
Patient's mother, [name] @ [phone], has additional questions regarding telephone message from 2/24.  Please call and advise.

## 2015-01-30 NOTE — Telephone Encounter (Signed)
I called the mother. The results of the spinal fluid were discussed. The patient likely does not have CIDP. 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 The mother was saying that her brother died from some sort of a demyelinating disease that may have been genetic with associated brain lesions and optic neuritis. Would question whether this could have been adrenal leukodystrophy.

## 2015-02-01 DIAGNOSIS — F329 Major depressive disorder, single episode, unspecified: Secondary | ICD-10-CM | POA: Insufficient documentation

## 2015-02-01 DIAGNOSIS — F32A Depression, unspecified: Secondary | ICD-10-CM | POA: Insufficient documentation

## 2015-02-01 DIAGNOSIS — Z79899 Other long term (current) drug therapy: Secondary | ICD-10-CM | POA: Insufficient documentation

## 2015-02-01 DIAGNOSIS — T50905A Adverse effect of unspecified drugs, medicaments and biological substances, initial encounter: Secondary | ICD-10-CM | POA: Insufficient documentation

## 2015-02-03 ENCOUNTER — Ambulatory Visit: Payer: Medicaid Other | Admitting: Family Medicine

## 2015-02-06 ENCOUNTER — Emergency Department (HOSPITAL_COMMUNITY)
Admission: EM | Admit: 2015-02-06 | Discharge: 2015-02-06 | Disposition: A | Discharge status: Home / Self Care | Payer: Self-pay | Attending: Emergency Medicine | Admitting: Emergency Medicine

## 2015-02-06 ENCOUNTER — Ambulatory Visit: Payer: Medicaid Other | Admitting: Family Medicine

## 2015-02-06 ENCOUNTER — Telehealth: Payer: Self-pay | Admitting: *Deleted

## 2015-02-06 ENCOUNTER — Encounter (HOSPITAL_COMMUNITY): Payer: Self-pay | Admitting: Emergency Medicine

## 2015-02-06 DIAGNOSIS — Z91040 Latex allergy status: Secondary | ICD-10-CM | POA: Insufficient documentation

## 2015-02-06 DIAGNOSIS — G40909 Epilepsy, unspecified, not intractable, without status epilepticus: Secondary | ICD-10-CM | POA: Insufficient documentation

## 2015-02-06 DIAGNOSIS — R011 Cardiac murmur, unspecified: Secondary | ICD-10-CM | POA: Insufficient documentation

## 2015-02-06 DIAGNOSIS — F419 Anxiety disorder, unspecified: Secondary | ICD-10-CM | POA: Insufficient documentation

## 2015-02-06 DIAGNOSIS — Z87448 Personal history of other diseases of urinary system: Secondary | ICD-10-CM | POA: Insufficient documentation

## 2015-02-06 DIAGNOSIS — Z87891 Personal history of nicotine dependence: Secondary | ICD-10-CM | POA: Insufficient documentation

## 2015-02-06 DIAGNOSIS — Z9884 Bariatric surgery status: Secondary | ICD-10-CM | POA: Insufficient documentation

## 2015-02-06 DIAGNOSIS — F329 Major depressive disorder, single episode, unspecified: Secondary | ICD-10-CM | POA: Insufficient documentation

## 2015-02-06 DIAGNOSIS — Z79899 Other long term (current) drug therapy: Secondary | ICD-10-CM | POA: Insufficient documentation

## 2015-02-06 DIAGNOSIS — G43909 Migraine, unspecified, not intractable, without status migrainosus: Secondary | ICD-10-CM | POA: Insufficient documentation

## 2015-02-06 DIAGNOSIS — Z8739 Personal history of other diseases of the musculoskeletal system and connective tissue: Secondary | ICD-10-CM | POA: Insufficient documentation

## 2015-02-06 DIAGNOSIS — G8929 Other chronic pain: Secondary | ICD-10-CM | POA: Insufficient documentation

## 2015-02-06 DIAGNOSIS — Z3202 Encounter for pregnancy test, result negative: Secondary | ICD-10-CM | POA: Insufficient documentation

## 2015-02-06 DIAGNOSIS — D649 Anemia, unspecified: Secondary | ICD-10-CM | POA: Insufficient documentation

## 2015-02-06 DIAGNOSIS — Z8719 Personal history of other diseases of the digestive system: Secondary | ICD-10-CM | POA: Insufficient documentation

## 2015-02-06 LAB — CBC WITH DIFFERENTIAL/PLATELET
Basophils Absolute: 0 10*3/uL (ref 0.0–0.1)
Basophils Relative: 0 % (ref 0–1)
Eosinophils Absolute: 0.3 10*3/uL (ref 0.0–0.7)
Eosinophils Relative: 4 % (ref 0–5)
HCT: 28.9 % — ABNORMAL LOW (ref 36.0–46.0)
Hemoglobin: 8.7 g/dL — ABNORMAL LOW (ref 12.0–15.0)
Lymphocytes Relative: 38 % (ref 12–46)
Lymphs Abs: 3.2 10*3/uL (ref 0.7–4.0)
MCH: 23.1 pg — ABNORMAL LOW (ref 26.0–34.0)
MCHC: 30.1 g/dL (ref 30.0–36.0)
MCV: 76.7 fL — ABNORMAL LOW (ref 78.0–100.0)
Monocytes Absolute: 1.2 10*3/uL — ABNORMAL HIGH (ref 0.1–1.0)
Monocytes Relative: 14 % — ABNORMAL HIGH (ref 3–12)
Neutro Abs: 3.8 10*3/uL (ref 1.7–7.7)
Neutrophils Relative %: 44 % (ref 43–77)
Platelets: 473 10*3/uL — ABNORMAL HIGH (ref 150–400)
RBC: 3.77 MIL/uL — ABNORMAL LOW (ref 3.87–5.11)
RDW: 17.2 % — ABNORMAL HIGH (ref 11.5–15.5)
WBC: 8.5 10*3/uL (ref 4.0–10.5)

## 2015-02-06 LAB — BASIC METABOLIC PANEL
Anion gap: 8 (ref 5–15)
BUN: 12 mg/dL (ref 6–23)
CALCIUM: 9.1 mg/dL (ref 8.4–10.5)
CO2: 19 mmol/L (ref 19–32)
Chloride: 109 mmol/L (ref 96–112)
Creatinine, Ser: 0.73 mg/dL (ref 0.50–1.10)
GFR calc Af Amer: >90 mL/min (ref >90)
GLUCOSE: 101 mg/dL — AB (ref 70–99)
Potassium: 4.6 mmol/L (ref 3.5–5.1)
Sodium: 136 mmol/L (ref 135–145)

## 2015-02-06 LAB — URINE MICROSCOPIC-ADD ON

## 2015-02-06 LAB — URINALYSIS, ROUTINE W REFLEX MICROSCOPIC
Bilirubin Urine: NEGATIVE
Glucose, UA: NEGATIVE mg/dL
Ketones, ur: NEGATIVE mg/dL
Nitrite: NEGATIVE
Protein, ur: NEGATIVE mg/dL
Specific Gravity, Urine: 1.015 (ref 1.005–1.030)
Urobilinogen, UA: 0.2 mg/dL (ref 0.0–1.0)
pH: 6.5 (ref 5.0–8.0)

## 2015-02-06 LAB — PREGNANCY, URINE: Preg Test, Ur: NEGATIVE

## 2015-02-06 MED ORDER — FERROUS SULFATE 325 (65 FE) MG PO TABS: 325.0000 mg | ORAL_TABLET | Freq: Two times a day (BID) | ORAL | Status: DC

## 2015-02-06 MED ORDER — SODIUM CHLORIDE 0.9 % IV BOLUS (SEPSIS)
2000.0000 mL | Freq: Once | INTRAVENOUS | Status: AC
  Administered 2015-02-06: 2000 mL via INTRAVENOUS

## 2015-02-06 NOTE — ED Notes (Signed)
Pt c/o intermittent "light vaginal bleeding with some clots" x 1 month. Pt reports dizziness x 1 week and 2 syncopal episodes last night and today. Pt also reports n/v x 2 days. Denies diarrhea. lnbm yesterday.

## 2015-02-06 NOTE — Discharge Instructions (Signed)
Anemia, Nonspecific Anemia is a condition in which the concentration of red blood cells or hemoglobin in the blood is below normal. Hemoglobin is a substance in red blood cells that carries oxygen to the tissues of the body. Anemia results in not enough oxygen reaching these tissues.  CAUSES  Common causes of anemia include:   Excessive bleeding. Bleeding may be internal or external. This includes excessive bleeding from periods (in women) or from the intestine.   Poor nutrition.   Chronic kidney, thyroid, and liver disease.  Bone marrow disorders that decrease red blood cell production.  Cancer and treatments for cancer.  HIV, AIDS, and their treatments.  Spleen problems that increase red blood cell destruction.  Blood disorders.  Excess destruction of red blood cells due to infection, medicines, and autoimmune disorders. SIGNS AND SYMPTOMS   Minor weakness.   Dizziness.   Headache.  Palpitations.   Shortness of breath, especially with exercise.   Paleness.  Cold sensitivity.  Indigestion.  Nausea.  Difficulty sleeping.  Difficulty concentrating. Symptoms may occur suddenly or they may develop slowly.  DIAGNOSIS  Additional blood tests are often needed. These help your health care provider determine the best treatment. Your health care provider will check your stool for blood and look for other causes of blood loss.  TREATMENT  Treatment varies depending on the cause of the anemia. Treatment can include:   Supplements of iron, vitamin B84, or folic acid.   Hormone medicines.   A blood transfusion. This may be needed if blood loss is severe.   Hospitalization. This may be needed if there is significant continual blood loss.   Dietary changes.  Spleen removal. HOME CARE INSTRUCTIONS Keep all follow-up appointments. It often takes many weeks to correct anemia, and having your health care provider check on your condition and your response to  treatment is very important. SEEK IMMEDIATE MEDICAL CARE IF:   You develop extreme weakness, shortness of breath, or chest pain.   You become dizzy or have trouble concentrating.  You develop heavy vaginal bleeding.   You develop a rash.   You have bloody or black, tarry stools.   You faint.   You vomit up blood.   You vomit repeatedly.   You have abdominal pain.  You have a fever or persistent symptoms for more than 2-3 days.   You have a fever and your symptoms suddenly get worse.   You are dehydrated.  MAKE SURE YOU:  Understand these instructions.  Will watch your condition.  Will get help right away if you are not doing well or get worse. Document Released: 12/30/2004 Document Revised: 07/25/2013 Document Reviewed: 05/18/2013 [facility] Patient Information 2015 [place], [place]. This information is not intended to replace advice given to you by your health care provider. Make sure you discuss any questions you have with your health care provider.  Please follow-up with PCP for further management. If symptoms worsen please seek immediate medical care.

## 2015-02-06 NOTE — ED Notes (Signed)
Pt reporting feeling some better.  IV bolus infusing without difficulty.

## 2015-02-06 NOTE — ED Provider Notes (Signed)
CSN: [id number]     Arrival date & time 02/06/15  1602 History   First MD Initiated Contact with Patient 02/06/15 1606     Chief Complaint  Patient presents with  . Loss of Consciousness    HPI   44 YOF presents with 3 days of worsening fatigue, dizziness, and episodes of near syncope. She states that 3 weeks ago she was started on birth control by GYN in an attempt to have a period. She notes minimal spotting since the initiation of medication but denies having to use tampons. She reports feeling well until three days ago when she began to have constant weakness and dizziness that was made worse when she stood up. Reports episodes after standing where she had to sit down before "passing out". 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 Currently taking iron supplements. Pt denies SOB, chest pain, hematuria, hematochezia, melena, or abnormal bruising.    Past Medical History  Diagnosis Date  . Migraines   . Interstitial cystitis   . IUD FEB 2010  . Gastritis JULY 2011  . Depression   . 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 Fatty liver   . Obesity (BMI 30-39.9) 2011 228 LBS BMI 36.8  . Iron deficiency anemia 07/23/2010  . Irritable bowel syndrome 2012 DIARRHEA    JUN 2012 TTG IgA 14.9  . Elevated liver enzymes JUL 2011ALK PHOS 111-127 AST  143-267 ALT  213-321T BILI 0.6  ALB  3.7-4.06 Jun 2011 ALK PHOS 118 AST 24 ALT 42 T BILI 0.4 ALB 3.9  . Chronic daily headache   . Ovarian cyst   . PONV (postoperative nausea and vomiting)   . ADD (attention deficit disorder)   . RLQ abdominal pain 07/18/2013  . Menorrhagia 07/18/2013  . Anxiety   . Potassium (K) deficiency   . Dysmenorrhea 09/18/2013  . Stress 09/18/2013  . Patient desires pregnancy 09/18/2013  . Pregnant 12/25/2013  . Blood transfusion without reported diagnosis   . Heart murmur   . Gastric bypass status for obesity   . 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 Dysrhythmia   . Sciatica  of left side 11/14/2014  . Fibromyalgia   . 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 Gab  2007    in [place]-POUCH 5 CM  . Cholecystectomy  2005    biliary dyskinesia  . Cath self every nite      for sodium bicarb injection (discontinued 2013)  . Colonoscopy  JUN 2012 ABD PN/DIARRHEA WITH PROPOFOL    NL COLON  . Upper gastrointestinal endoscopy  JULY 2011 NAUSEA-D125,V6, PH 25    Bx; GASTRITIS, POUCH-5 CM LONG  . Dilation and curettage of uterus    . Tonsillectomy    . Gastric bypass  06/2006  . Savory dilation  06/20/2012    XJO:ITGP gastritis/Ulcer in the mid jejunum  . Tonsillectomy and adenoidectomy    . Esophagogastroduodenoscopy    . Wisdom tooth extraction    . Repair vaginal cuff N/A 07/30/2014    Procedure: REPAIR VAGINAL CUFF;  Surgeon: Mora Bellman, MD;  Location: [facility];  Service: Gynecology;  Laterality: N/A;  . Hysteroscopy w/d&c N/A 09/12/2014    Procedure: DILATATION AND CURETTAGE /HYSTEROSCOPY;  Surgeon: Jonnie Kind, MD;  Location: [facility];  Service: Gynecology;  Laterality: N/A;   Family History  Problem Relation  Age of Onset  . Hemochromatosis Maternal Grandmother   . Migraines Maternal Grandmother   . Cancer Maternal Grandmother   . Breast cancer Maternal Grandmother   . Hypertension Father   . Diabetes Father   . Coronary artery disease Father   . Migraines Paternal Grandmother   . Breast cancer Paternal Grandmother   . Cancer Mother     breast  . Hemochromatosis Mother   . Breast cancer Mother   . Coronary artery disease Paternal Grandfather    History  Substance Use Topics  . Smoking status: Former Smoker -- 0.50 packs/day for 5 years    Types: Cigarettes    Quit date: 01/07/2014  . Smokeless tobacco: Never Used  . Alcohol Use: No   OB History    Gravida Para Term Preterm AB TAB SAB Ectopic Multiple Living   '3 2 1 1 1  1   2     '$ Review of Systems  All other  systems reviewed and are negative.   Allergies  Gabapentin; Metoclopramide hcl; Topamax; Codeine; Zofran; Latex; and Tape  Home Medications   Prior to Admission medications   Medication Sig Start Date End Date Taking? Authorizing Provider  clonazePAM (KLONOPIN) 0.5 MG tablet Take 1 tablet (0.5 mg total) by mouth 2 (two) times daily as needed for anxiety. 12/26/14   Kathyrn Drown, MD  DULoxetine (CYMBALTA) 60 MG capsule Take 1 capsule (60 mg total) by mouth daily. 12/26/14   Kathyrn Drown, MD  norgestimate-ethinyl estradiol (ORTHO-CYCLEN,SPRINTEC,PREVIFEM) 0.25-35 MG-MCG tablet Take 1 tablet by mouth daily. 01/21/15   Jonnie Kind, MD  promethazine (PHENERGAN) 25 MG tablet Take 1 tablet (25 mg total) by mouth every 8 (eight) hours as needed for nausea or vomiting. 12/30/14   Mariea Clonts, MD  tiZANidine (ZANAFLEX) 4 MG tablet Take 1 tablet (4 mg total) by mouth 3 (three) times daily. 01/29/15   Kathrynn Ducking, MD  topiramate (TOPAMAX) 25 MG tablet Take one tablet at night for one week, then take 2 tablets at night for one week, then take 3 tablets at night. 01/29/15   Kathrynn Ducking, MD  traMADol (ULTRAM) 50 MG tablet Take 1 tablet (50 mg total) by mouth every 6 (six) hours as needed. 01/02/15   Kathrynn Ducking, MD  zolpidem (AMBIEN) 5 MG tablet Take 1 tablet (5 mg total) by mouth at bedtime as needed for sleep. 12/26/14   Kathyrn Drown, MD   BP 115/67 mmHg  Pulse 66  Temp(Src) 97.6 F (36.4 C)  Resp 18  Ht $R'5\' 6"'IL$  (1.676 m)  Wt 260 lb (117.935 kg)  BMI 41.99 kg/m2  SpO2 100%  LMP 03/06/2008 Physical Exam  Constitutional: She is oriented to person, place, and time. She appears well-developed and well-nourished.  HENT:  Head: Normocephalic and atraumatic.  Eyes: Pupils are equal, round, and reactive to light.  Conjunctival pallor  Neck: Normal range of motion. Neck supple. No JVD present. No tracheal deviation present. No thyromegaly present.  Cardiovascular: Regular rhythm,  normal heart sounds and intact distal pulses.  Exam reveals no gallop and no friction rub.   No murmur heard. Pulmonary/Chest: Effort normal and breath sounds normal. No stridor. No respiratory distress. She has no wheezes. She has no rales. She exhibits no tenderness.  Abdominal: Soft. Bowel sounds are normal. She exhibits no distension and no mass. There is no tenderness. There is no rebound and no guarding.  Musculoskeletal: Normal range of motion.  Lymphadenopathy:  She has no cervical adenopathy.  Neurological: She is alert and oriented to person, place, and time. Coordination normal.  Skin: Skin is warm and dry.  Psychiatric: She has a normal mood and affect. Her behavior is normal. Judgment and thought content normal.  Nursing note and vitals reviewed.   ED Course  Procedures (including critical care time) Labs Review Labs Reviewed  CBC WITH DIFFERENTIAL/PLATELET  URINALYSIS, ROUTINE W REFLEX MICROSCOPIC  PREGNANCY, URINE    Imaging Review No results found.   EKG Interpretation   Date/Time:  Thursday February 06 2015 16:14:49 EST Ventricular Rate:  60 PR Interval:  176 QRS Duration: 84 QT Interval:  423 QTC Calculation: 423 R Axis:   54 Text Interpretation:  Sinus rhythm Since last tracing rate slower Normal  ECG Confirmed by MILLER  MD, BRIAN ([id number]) on 02/06/2015 4:34:44 PM      MDM   Final diagnoses:  None   Pt's past medical history significant for iron deficiency anemia. Today's presentation likely represents the same with decreased h/h, MCV. And orthostatic BP results. BMP showed no electrolyte abnormalities that would suggest other diagnoses. EKG showed no abnormalities. Pt given IV fluids here. She discharged with Fe supplements and instructions to follow-up with with her PCP for further management of anemia. She was given instruction to return to ED if symptoms acutely worsened. She understood and agreed to the care plan.     Stevie Kern Canaan Prue,  PA-C 02/06/15 1933  Johnna Acosta, MD 02/06/15 [phone]

## 2015-02-06 NOTE — Telephone Encounter (Signed)
Pt said she is having chest pains, SOB, numbness and tingling in her arms and legs, passing out episodes, seeing spots. This has been going on for 3 days.  Based on those s/s, Pt needs to go to the ER and not wait to be seen here at the [facility], per Dr. Nicki Reaper.  Pt notified and verbalized understanding.

## 2015-02-07 ENCOUNTER — Telehealth: Payer: Self-pay | Admitting: *Deleted

## 2015-02-07 ENCOUNTER — Telehealth: Payer: Self-pay | Admitting: Family Medicine

## 2015-02-07 NOTE — Telephone Encounter (Signed)
Pt went to ED, hemoglobin was not as low as she thought She states that her BP & heart rate dropping. States she was Positive for orthostatics. States when she stands everything Goes black an white. She supposed to return to work on Monday.  She wants to figure out what's going on before she goes back. Was given fluid at the hospital but pt states she is drinking fine an  Does not feel she is dehydrated.   Please advise

## 2015-02-07 NOTE — Telephone Encounter (Signed)
appt made for Monday morning

## 2015-02-07 NOTE — Telephone Encounter (Signed)
Spoke to patient and she is still complaining of being lightheaded, dizzy when she stands.  Says she cannot do anything but stay on couch.  I told her if she didn't feel better she should go to Urgent care of back to the ER if she needs fluids.  And to continue to drink water to prevent dehydration.  She verbalized understanding.

## 2015-02-07 NOTE — Telephone Encounter (Signed)
f u visit with scott mond, excuse thru then

## 2015-02-07 NOTE — Telephone Encounter (Signed)
Patient spoke to PCP and was advised to go to ER. Patient was dehydrated and was told she was orthostatic. Patient can barely walk and her PCP is out of the office. Patient was advised that Dr. [name] is out of the office as well today and still would like to speak to his nurse. Please advise.

## 2015-02-07 NOTE — Telephone Encounter (Signed)
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  Pt was a "No Show" on Monday after hours.  Pt called yesterday with these s/s: Pt said she is having chest pains, SOB, numbness and tingling in her arms and legs, passing out episodes, seeing spots. This has been going on for 3 days.  Based on those s/s, Pt needs to go to the ER and not wait to be seen here at the [facility], per Dr. Nicki Reaper.  Pt notified and verbalized understanding.   Pt dx by the ED with anemia

## 2015-02-10 ENCOUNTER — Ambulatory Visit: Payer: Self-pay | Admitting: Family Medicine

## 2015-02-11 ENCOUNTER — Telehealth: Payer: Self-pay | Admitting: Neurology

## 2015-02-11 NOTE — Telephone Encounter (Signed)
Patient is calling because she is having tingling and numbess in mid back, legs, knees  and feet and has a lot of pain.   Please call.

## 2015-02-11 NOTE — Telephone Encounter (Signed)
The patient indicates that she is having increasing numbness in the back, down the legs, increased pain. She also indicates that last week she began having blackouts when she stood up. Blood pressures that we have documented previously been unremarkable. I'll get another revisit for her.

## 2015-02-12 ENCOUNTER — Other Ambulatory Visit: Payer: Self-pay | Admitting: Neurology

## 2015-02-12 NOTE — Telephone Encounter (Signed)
I called patient. The patient is to use the Ultram if needed for the pain. We will try to get a work in revisit in the near future.

## 2015-02-12 NOTE — Telephone Encounter (Signed)
Spoke to patient to schedule appointment and she became very tearful, and relayed she was in a lot of pain.  She asked if the doctor could prescribe something for the pain.

## 2015-02-14 ENCOUNTER — Telehealth: Payer: Self-pay | Admitting: Neurology

## 2015-02-14 NOTE — Telephone Encounter (Signed)
Called pt back and talked with her over the phone. She stated that at she still felt pain from the mid back down to the leg and feet. She also still had dizzy spells, especially on standing up and seeing dots. She stated that she has had vaginal bleeding for about one month as per [facility] doctor using medication to induce her periods. She stated that has to change pad every 2 hours.   She went to ER a week ago, had low hemoglobin at 8.7, blood pressure around 675 systolic. However she stated that the when she stood up her blood pressure down to 88, which was not on the record. Her previous ER visits in February and January blood pressure around 916-384 systolic. Dr. [name] also ordered Toradol for her for pain treatment 2 days ago. I told her that Toradol at this moment may make her vaginal bleeding difficult to control, so I'm not recommending her to pick up the medication at this time.   I am more concerning about her vaginal bleeding, anemia, and sounds like orthostatic hypotension. I'm asking her to check her blood pressure, however she does not have any blood pressure monitoring device. However she stated that her husband works as Neurosurgeon, he is able to check her blood pressure soon after work. I instructed her to go to nearby urgent care or go to ER if her blood pressure lower than 90 on standing up or continue feel dizzy on standing. She expressed understanding and appreciation. However, during my conversation with her, she was able to taking care of her daughter (who is actively crying on the other end of the phone) at same time, I'm not sure how dizzy she is. She has appointment 02/18/2015 with Dr. [name] in our office.  Rosalin Hawking, MD PhD Stroke Neurology 02/14/2015 5:58 PM

## 2015-02-14 NOTE — Telephone Encounter (Signed)
Patient left message at our after hour call center, stated that she has been having numbness from back to leg and the dizzy spells. I called number back [phone], however nobody available to pick up the phone. At left voice message. I will call her again in 15 minutes.  Rosalin Hawking, MD PhD Stroke Neurology 02/14/2015 5:08 PM

## 2015-02-17 ENCOUNTER — Encounter: Payer: Self-pay | Admitting: Nurse Practitioner

## 2015-02-17 ENCOUNTER — Ambulatory Visit (INDEPENDENT_AMBULATORY_CARE_PROVIDER_SITE_OTHER): Payer: Self-pay | Admitting: Nurse Practitioner

## 2015-02-17 VITALS — BP 112/74 | Ht 66.0 in

## 2015-02-17 DIAGNOSIS — D509 Iron deficiency anemia, unspecified: Secondary | ICD-10-CM

## 2015-02-17 DIAGNOSIS — R300 Dysuria: Secondary | ICD-10-CM

## 2015-02-17 DIAGNOSIS — R42 Dizziness and giddiness: Secondary | ICD-10-CM

## 2015-02-17 LAB — POCT UA - MICROSCOPIC ONLY
Bacteria, U Microscopic: 0
CASTS, UR, LPF, POC: 0
CRYSTALS, UR, HPF, POC: 0
EPITHELIAL CELLS, URINE PER MICROSCOPY: 0
Mucus, UA: 0
RBC, URINE, MICROSCOPIC: 0
WBC, Ur, HPF, POC: 0
YEAST UA: 0

## 2015-02-17 LAB — POCT URINALYSIS DIPSTICK
RBC UA: POSITIVE
Spec Grav, UA: 1.025
pH, UA: 6.5

## 2015-02-17 LAB — POCT HEMOGLOBIN: HEMOGLOBIN: 8.7 g/dL — AB (ref 12.2–16.2)

## 2015-02-17 NOTE — Telephone Encounter (Signed)
I called the pharmacy.  Spoke with [employer].  He verified they did get the Rx sent on 03/09 for Ketorolac, and it has been ready for pick up since that time.  I called the patient back 3 times.  Each time someone answered the phone, but did not speak and then the call would disconnect.   The note below from Dr Erlinda Hong recommended she not pick up the med at this time due due other medical issues she is currently having.

## 2015-02-17 NOTE — Telephone Encounter (Signed)
Patient is calling back stating the pharmacy does not have her Rx (Toradol). The patient is sitting in the pharmacy parking lot right now and would like it re-faxed. Please advise.

## 2015-02-18 ENCOUNTER — Ambulatory Visit (INDEPENDENT_AMBULATORY_CARE_PROVIDER_SITE_OTHER): Payer: Self-pay | Admitting: Neurology

## 2015-02-18 ENCOUNTER — Telehealth: Payer: Self-pay

## 2015-02-18 ENCOUNTER — Encounter: Payer: Self-pay | Admitting: Advanced Practice Midwife

## 2015-02-18 ENCOUNTER — Ambulatory Visit (INDEPENDENT_AMBULATORY_CARE_PROVIDER_SITE_OTHER): Payer: Self-pay | Admitting: Advanced Practice Midwife

## 2015-02-18 ENCOUNTER — Encounter: Payer: Self-pay | Admitting: Nurse Practitioner

## 2015-02-18 ENCOUNTER — Encounter: Payer: Self-pay | Admitting: Neurology

## 2015-02-18 VITALS — BP 130/72 | Ht 66.0 in | Wt 253.0 lb

## 2015-02-18 VITALS — BP 127/87 | HR 113 | Ht 66.0 in | Wt 253.2 lb

## 2015-02-18 DIAGNOSIS — N939 Abnormal uterine and vaginal bleeding, unspecified: Secondary | ICD-10-CM

## 2015-02-18 DIAGNOSIS — F445 Conversion disorder with seizures or convulsions: Secondary | ICD-10-CM

## 2015-02-18 DIAGNOSIS — F410 Panic disorder [episodic paroxysmal anxiety] without agoraphobia: Secondary | ICD-10-CM

## 2015-02-18 DIAGNOSIS — F418 Other specified anxiety disorders: Secondary | ICD-10-CM

## 2015-02-18 DIAGNOSIS — G43719 Chronic migraine without aura, intractable, without status migrainosus: Secondary | ICD-10-CM

## 2015-02-18 DIAGNOSIS — N9089 Other specified noninflammatory disorders of vulva and perineum: Secondary | ICD-10-CM | POA: Insufficient documentation

## 2015-02-18 DIAGNOSIS — R569 Unspecified convulsions: Secondary | ICD-10-CM

## 2015-02-18 DIAGNOSIS — G609 Hereditary and idiopathic neuropathy, unspecified: Secondary | ICD-10-CM

## 2015-02-18 DIAGNOSIS — M797 Fibromyalgia: Secondary | ICD-10-CM

## 2015-02-18 LAB — POCT HEMOGLOBIN: HEMOGLOBIN: 8.1 g/dL — AB (ref 12.2–16.2)

## 2015-02-18 MED ORDER — CLOBETASOL PROP EMOLLIENT BASE 0.05 % EX CREA: TOPICAL_CREAM | CUTANEOUS | Status: DC

## 2015-02-18 MED ORDER — TOPIRAMATE 100 MG PO TABS: 100.0000 mg | ORAL_TABLET | Freq: Every day | ORAL | Status: DC

## 2015-02-18 MED ORDER — DULOXETINE HCL 30 MG PO CPEP: 30.0000 mg | ORAL_CAPSULE | Freq: Every day | ORAL | Status: DC

## 2015-02-18 NOTE — Progress Notes (Signed)
Reason for visit: Numbness, pain  Penny [name] is an 29 y.o. female  History of present illness:  Ms. 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 The patient has now reported numbness and tingly sensations from the waist level down into the legs. She reports some problems with walking, collapsing of the legs. He has increased pain below the waist. 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 EMG evaluation of the legs were normal. The patient has been treated for diffuse neuromuscular pain felt possibly related to fibromyalgia. She had to come off of the Lyrica secondary to weight gain. She finds that the tizanidine results in too much drowsiness. She is on Cymbalta and clonazepam through mental health, but she reports that she is frustrated with this system as she cannot get adequate treatment for anxiety and depression. She is having very frequent panic attacks. The patient has had recent "seizures" with drawing of the left side the body. The patient indicates that her husband just left her over the last several days. She is back to work, she has worked only 3 days. She is working 36 hours a week, working 12 hour shifts. She works for the hospital system. 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 She indicates that she cannot afford her medications at this time.  Past Medical History  Diagnosis Date  . Migraines   . Interstitial cystitis   . IUD FEB 2010  . Gastritis JULY 2011  . Depression   . 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 Fatty liver   . Obesity (BMI 30-39.9) 2011 228 LBS BMI 36.8  . Iron deficiency anemia 07/23/2010  . Irritable bowel syndrome 2012 DIARRHEA    JUN 2012 TTG IgA 14.9  . Elevated liver enzymes JUL 2011ALK PHOS 111-127 AST  143-267 ALT  213-321T BILI 0.6  ALB  3.7-4.06 Jun 2011 ALK PHOS 118 AST 24 ALT 42 T BILI 0.4 ALB 3.9  . Chronic daily headache   . Ovarian cyst   . PONV (postoperative nausea and vomiting)   . ADD (attention deficit disorder)   . RLQ abdominal pain 07/18/2013  . Menorrhagia 07/18/2013  . Anxiety   . Potassium (K) deficiency   . Dysmenorrhea 09/18/2013  . Stress 09/18/2013  . Patient desires pregnancy 09/18/2013  . Pregnant 12/25/2013  . Blood transfusion without reported diagnosis   . Heart murmur   . Gastric bypass status for obesity   . 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 Dysrhythmia   . Sciatica of left side 11/14/2014  . Fibromyalgia   . 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    Past Surgical History  Procedure Laterality Date  . Gab  2007    in [place]-POUCH 5 CM  . Cholecystectomy  2005    biliary dyskinesia  . Cath self every nite      for sodium bicarb injection (discontinued 2013)  . Colonoscopy  JUN 2012 ABD PN/DIARRHEA WITH PROPOFOL    NL COLON  . Upper gastrointestinal endoscopy  JULY 2011 NAUSEA-D125,V6, PH 25    Bx; GASTRITIS, POUCH-5 CM LONG  . Dilation and curettage of uterus    . Tonsillectomy    . Gastric bypass  06/2006  . Savory dilation  06/20/2012  TXL:EZVG gastritis/Ulcer in the mid jejunum  . Tonsillectomy and adenoidectomy    . Esophagogastroduodenoscopy    . Wisdom tooth extraction    . Repair vaginal cuff N/A 07/30/2014    Procedure: REPAIR VAGINAL CUFF;  Surgeon: Mora Bellman, MD;  Location: [facility];  Service: Gynecology;  Laterality: N/A;  . Hysteroscopy w/d&c N/A 09/12/2014    Procedure: DILATATION AND CURETTAGE /HYSTEROSCOPY;  Surgeon: Jonnie Kind, MD;  Location: [facility];  Service: Gynecology;  Laterality: N/A;    Family History  Problem Relation Age of Onset  . Hemochromatosis Maternal Grandmother   . Migraines Maternal Grandmother   . Cancer Maternal Grandmother   . Breast cancer Maternal Grandmother   . Hypertension Father   . Diabetes Father     . Coronary artery disease Father   . Migraines Paternal Grandmother   . Breast cancer Paternal Grandmother   . Cancer Mother     breast  . Hemochromatosis Mother   . Breast cancer Mother   . Coronary artery disease Paternal Grandfather     Social history:  reports that she quit smoking about 13 months ago. Her smoking use included Cigarettes. She has a 2.5 pack-year smoking history. She has never used smokeless tobacco. She reports that she does not drink alcohol or use illicit drugs.    Allergies  Allergen Reactions  . Gabapentin Other (See Comments)    Pt states that she was unresponsive after taking this medication, but her vitals remained stable.    . Metoclopramide Hcl Anxiety and Other (See Comments)    Pt states that she felt like she was trapped in a box, and could not get out.  Pt also states that she had temporary loss of movement, weakness, and tingling.    . Topamax [Topiramate] Shortness Of Breath and Other (See Comments)    Adverse side effects.  Pt states this medication changed her taste for a short period of time and caused numbness and tingling in extremities.    . Codeine Nausea And Vomiting  . Zofran [Ondansetron] Other (See Comments)    Migraines   . Latex Rash  . Tape Itching and Rash    Medications:  Prior to Admission medications   Medication Sig Start Date End Date Taking? Authorizing Provider  clonazePAM (KLONOPIN) 0.5 MG tablet Take 1 tablet (0.5 mg total) by mouth 2 (two) times daily as needed for anxiety. 12/26/14  Yes Kathyrn Drown, MD  cyclobenzaprine (FLEXERIL) 5 MG tablet Take 5 mg by mouth every 8 (eight) hours as needed for muscle spasms.   Yes Historical Provider, MD  DULoxetine (CYMBALTA) 60 MG capsule Take 1 capsule (60 mg total) by mouth daily. 12/26/14  Yes Kathyrn Drown, MD  ferrous sulfate 325 (65 FE) MG tablet Take 1 tablet (325 mg total) by mouth 2 (two) times daily with a meal. 02/06/15  Yes Okey Regal, PA-C  ketorolac (TORADOL) 10  MG tablet Take 1 tablet (10 mg total) by mouth every 8 (eight) hours as needed for moderate pain. 02/12/15  Yes Kathrynn Ducking, MD  tiZANidine (ZANAFLEX) 4 MG tablet Take 1 tablet (4 mg total) by mouth 3 (three) times daily. Patient taking differently: Take 4 mg by mouth at bedtime.  01/29/15  Yes Kathrynn Ducking, MD  topiramate (TOPAMAX) 25 MG tablet Take one tablet at night for one week, then take 2 tablets at night for one week, then take 3 tablets at night. Patient taking differently: Take 75 mg by  mouth at bedtime.  01/29/15  Yes Kathrynn Ducking, MD  zolpidem (AMBIEN) 5 MG tablet Take 1 tablet (5 mg total) by mouth at bedtime as needed for sleep. 12/26/14  Yes Kathyrn Drown, MD    ROS:  Out of a complete 14 system review of symptoms, the patient complains only of the following symptoms, and all other reviewed systems are negative.  Decreased activity, appetite change, fatigue, weight gain, excessive sweating Ringing in the ears Loss of vision, blurred vision Shortness of breath Chest pain, leg swelling, palpitations of the heart Excessive thirst Abdominal pain, nausea Restless legs, insomnia, daytime sleepiness, sleep talking, shift work Painful urination Back pain, walking difficulty Itching Anemia Memory loss, dizziness, headache, numbness, seizures, weakness, passing out Agitation, confusion, depression, anxiety, suicidal thoughts  Blood pressure 127/87, pulse 113, height $RemoveBef'5\' 6"'Jvpihzxhgs$  (1.676 m), weight 253 lb 3.2 oz (114.851 kg), last menstrual period 03/06/2008, not currently breastfeeding.  Physical Exam  General: The patient is alert and cooperative at the time of the examination. The patient is markedly obese. The patient has a flat affect.  Skin: No significant peripheral edema is noted.   Neurologic Exam  Mental status: The patient is alert and oriented x 3 at the time of the examination. The patient has apparent normal recent and remote memory, with an apparently normal  attention span and concentration ability.   Cranial nerves: Facial symmetry is present. Speech is normal, no aphasia or dysarthria is noted. Extraocular movements are full. Visual fields are full.  Motor: The patient has good strength in all 4 extremities.  Sensory examination: Soft touch sensation is slightly decreased on the left face, arms, and legs. The patient splits the midline with vibration sensation on the forehead, decreased on the left. Vibration sensation is symmetric on the hands, decreased on the left foot.  Coordination: The patient has good finger-nose-finger and heel-to-shin bilaterally.  Gait and station: The patient has a normal gait. Tandem gait is normal. Romberg is negative. No drift is seen.  Reflexes: Deep tendon reflexes are symmetric.   Assessment/Plan:  1. Obesity  2. Fibromyalgia  3. Peripheral neuropathy  4. Pseudoseizures  5. Left hemisensory deficit, psychogenic  6. Anxiety and depression  The patient is having significant issues with anxiety and depression. She is now working a new job, but has no Insurance underwriter, and her husband just left her. I will try to go up on the Cymbalta, but she cannot afford the medication unless she can get patient assistance for several months until she gets medical insurance. Hopefully, she will be able to go up to 90 mg daily of Cymbalta. The patient will be taken off of the tizanidine as this resulted in too much drowsiness. The Topamax will be increased to 100 mg at night. She will follow-up in 3 or 4 months.  Jill Alexanders MD 02/18/2015 8:27 PM  [facility] [address] [place], [place] [address]  Phone [phone] Fax [phone]

## 2015-02-18 NOTE — Telephone Encounter (Signed)
I called the patient to provide contact info for the Cymbalta Patient Assistance Program, [phone].  Got no answer.  Left message with info.

## 2015-02-18 NOTE — Progress Notes (Signed)
[facility] Visit  Patient name: Penny [name] MRN [id number]  Date of birth: [birth year]  CC & HPI:  Penny [name] is a 29 y.o. Caucasian female presenting today for C/O "vaginal bleeding for a month".  She had 10 days of provera to induce a bleed, then started Sprintec on 01/21/15.  She spotted the first 3 weeks of the pill, then bled "heavily with clots" last week.  Does not want to continue with birth control, as her husband left her yesterday. Also c/o "vaginal cracks"  and itching since delivery 6 months ago.  States has tried "all kinds of creams", but upon reviewing Epic, there are no visits addressing this concern and no meds have been prescribed.  Admits that maybe the "creams" were rx'd during pregnancy and have not been diligent about using any of them properly. Saw PCP yesterday for dysuria (culture pending) and this morning for neurology consult r/t headaches, dizziness   Pertinent History Reviewed:  Medical & Surgical Hx:   Past Medical History  Diagnosis Date  . Migraines   . Interstitial cystitis   . IUD FEB 2010  . Gastritis JULY 2011  . Depression   . 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 Fatty liver   . Obesity (BMI 30-39.9) 2011 228 LBS BMI 36.8  . Iron deficiency anemia 07/23/2010  . Irritable bowel syndrome 2012 DIARRHEA    JUN 2012 TTG IgA 14.9  . Elevated liver enzymes JUL 2011ALK PHOS 111-127 AST  143-267 ALT  213-321T BILI 0.6  ALB  3.7-4.06 Jun 2011 ALK PHOS 118 AST 24 ALT 42 T BILI 0.4 ALB 3.9  . Chronic daily headache   . Ovarian cyst   . PONV (postoperative nausea and vomiting)   . ADD (attention deficit disorder)   . RLQ abdominal pain 07/18/2013  . Menorrhagia 07/18/2013  . Anxiety   . Potassium (K) deficiency   . Dysmenorrhea 09/18/2013  . Stress 09/18/2013  . Patient desires pregnancy 09/18/2013  . Pregnant 12/25/2013  . Blood transfusion without reported diagnosis   . Heart murmur   . Gastric bypass status for obesity   .  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 Dysrhythmia   . Sciatica of left side 11/14/2014  . Fibromyalgia   . 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 Gab  2007    in [place]-POUCH 5 CM  . Cholecystectomy  2005    biliary dyskinesia  . Cath self every nite      for sodium bicarb injection (discontinued 2013)  . Colonoscopy  JUN 2012 ABD PN/DIARRHEA WITH PROPOFOL    NL COLON  . Upper gastrointestinal endoscopy  JULY 2011 NAUSEA-D125,V6, PH 25    Bx; GASTRITIS, POUCH-5 CM LONG  . Dilation and curettage of uterus    . Tonsillectomy    . Gastric bypass  06/2006  . Savory dilation  06/20/2012    ZDG:UYQI gastritis/Ulcer in the mid jejunum  . Tonsillectomy and adenoidectomy    . Esophagogastroduodenoscopy    . Wisdom tooth extraction    . Repair vaginal cuff N/A 07/30/2014    Procedure: REPAIR VAGINAL CUFF;  Surgeon: Catalina Antigua, MD;  Location: [facility];  Service: Gynecology;  Laterality: N/A;  . Hysteroscopy w/d&c N/A 09/12/2014    Procedure: DILATATION AND CURETTAGE /HYSTEROSCOPY;  Surgeon: Tilda Burrow, MD;  Location: [facility];  Service: Gynecology;  Laterality: N/A;   Family History  Problem Relation Age of Onset  . Hemochromatosis Maternal Grandmother   . Migraines Maternal Grandmother   . Cancer Maternal Grandmother   . Breast cancer Maternal Grandmother   . Hypertension Father   . Diabetes Father   . Coronary artery disease Father   . Migraines Paternal Grandmother   . Breast cancer Paternal Grandmother   . Cancer Mother     breast  . Hemochromatosis Mother   . Breast cancer Mother   . Coronary artery disease Paternal Grandfather     Current outpatient prescriptions:  .  clonazePAM (KLONOPIN) 0.5 MG tablet, Take 1 tablet (0.5 mg total) by mouth 2 (two) times daily as needed for anxiety., Disp: 30 tablet, Rfl: 2 .  cyclobenzaprine (FLEXERIL) 5 MG tablet, Take 5 mg by mouth every 8 (eight) hours  as needed for muscle spasms., Disp: , Rfl:  .  DULoxetine (CYMBALTA) 30 MG capsule, Take 1 capsule (30 mg total) by mouth daily., Disp: 30 capsule, Rfl: 3 .  ferrous sulfate 325 (65 FE) MG tablet, Take 1 tablet (325 mg total) by mouth 2 (two) times daily with a meal., Disp: 30 tablet, Rfl: 0 .  ketorolac (TORADOL) 10 MG tablet, Take 1 tablet (10 mg total) by mouth every 8 (eight) hours as needed for moderate pain., Disp: 30 tablet, Rfl: 0 .  topiramate (TOPAMAX) 100 MG tablet, Take 1 tablet (100 mg total) by mouth at bedtime., Disp: 30 tablet, Rfl: 3 .  zolpidem (AMBIEN) 5 MG tablet, Take 1 tablet (5 mg total) by mouth at bedtime as needed for sleep., Disp: 30 tablet, Rfl: 2 .  Clobetasol Prop Emollient Base 0.05 % emollient cream, Apply small amount to affected area BID, Disp: 30 g, Rfl: 2 .  DULoxetine (CYMBALTA) 60 MG capsule, Take 1 capsule (60 mg total) by mouth daily. (Patient not taking: Reported on 02/18/2015), Disp: 30 capsule, Rfl: 3 Social History: Reviewed -  reports that she quit smoking about 13 months ago. Her smoking use included Cigarettes. She has a 2.5 pack-year smoking history. She has never used smokeless tobacco.  Review of Systems:    Constitutional: Negative for fever and chills Eyes: Negative for visual disturbances Respiratory: Negative for shortness of breath, dyspnea Cardiovascular: Negative for chest pain or palpitations  Gastrointestinal: Negative for vomiting, diarrhea and constipation Genitourinary: Negative for dysuria and urgency Musculoskeletal: POSITIVE for back pain, joint pain, myalgias (longstanding, currently under care)  Neurological: POSITIVE for dizziness and headaches (longstanding, currently under care)     Objective Findings:  Vitals: BP 130/72 mmHg  Ht 5\' 6"  (1.676 m)  Wt 253 lb (114.76 kg)  BMI 40.85 kg/m2  LMP 03/06/2008  Breastfeeding? No  Physical Examination: General appearance - alert, well appearing, and in no distress, overweight  and chronically ill appearing Mental status - alert, oriented to person, place, and time, depressed mood Heart - normal rate and regular rhythm Abdomen - soft, nontender, nondistended, no masses or organomegaly Pelvic - VULVA: vulvar erythema with several fissures.  VAGINA: atrophic, vaginal erythema normal appearing vaginal discharge -  CERVIX: normal appearing cervix without discharge or lesions, UTERUS: uterus is normal size, shape, consistency and nontender,  ADNEXA: not palpable Extremities - no pedal edema noted Skin - normal coloration and turgor, no rashes, no suspicious skin lesions noted  Results for orders placed or performed in visit on 02/18/15 (from the past 24 hour(s))  POCT hemoglobin   Collection Time: 02/18/15 11:52 AM  Result  Value Ref Range   Hemoglobin 8.1 (A) 12.2 - 16.2 g/dL  Results for orders placed or performed in visit on 02/17/15 (from the past 24 hour(s))  POCT UA - Microscopic Only   Collection Time: 02/17/15  2:00 PM  Result Value Ref Range   WBC, Ur, HPF, POC 0    RBC, urine, microscopic 0    Bacteria, U Microscopic 0    Mucus, UA 0    Epithelial cells, urine per micros 0    Crystals, Ur, HPF, POC 0    Casts, Ur, LPF, POC 0    Yeast, UA 0     Assessment & Plan:  A:   vuvlar fissures  BTB on new birth control P:  Painted with gentian violet. Estrace cream qhs for ~ 4 weeks   No BC for now  F/U prn   CRESENZO-DISHMAN,Farley Crooker CNM 02/18/2015 1:50 PM

## 2015-02-18 NOTE — Patient Instructions (Addendum)
  We will stop the tizanidine, increase the Topamax taking 100 mg at night. I will increase the Cymbalta taking 30 mg in the morning, and 60 mg in the evening. We will try to get patient assistance for this medication.  Fibromyalgia Fibromyalgia is a disorder that is often misunderstood. It is associated with muscular pains and tenderness that comes and goes. It is often associated with fatigue and sleep disturbances. Though it tends to be long-lasting, fibromyalgia is not life-threatening. CAUSES  The exact cause of fibromyalgia is unknown. People with certain gene types are predisposed to developing fibromyalgia and other conditions. Certain factors can play a role as triggers, such as:  Spine disorders.  Arthritis.  Severe injury (trauma) and other physical stressors.  Emotional stressors. SYMPTOMS   The main symptom is pain and stiffness in the muscles and joints, which can vary over time.  Sleep and fatigue problems. Other related symptoms may include:  Bowel and bladder problems.  Headaches.  Visual problems.  Problems with odors and noises.  Depression or mood changes.  Painful periods (dysmenorrhea).  Dryness of the skin or eyes. DIAGNOSIS  There are no specific tests for diagnosing fibromyalgia. Patients can be diagnosed accurately from the specific symptoms they have. The diagnosis is made by determining that nothing else is causing the problems. TREATMENT  There is no cure. Management includes medicines and an active, healthy lifestyle. The goal is to enhance physical fitness, decrease pain, and improve sleep. HOME CARE INSTRUCTIONS   Only take over-the-counter or prescription medicines as directed by your caregiver. Sleeping pills, tranquilizers, and pain medicines may make your problems worse.  Low-impact aerobic exercise is very important and advised for treatment. At first, it may seem to make pain worse. Gradually increasing your tolerance will overcome this  feeling.  Learning relaxation techniques and how to control stress will help you. Biofeedback, visual imagery, hypnosis, muscle relaxation, yoga, and meditation are all options.  Anti-inflammatory medicines and physical therapy may provide short-term help.  Acupuncture or massage treatments may help.  Take muscle relaxant medicines as suggested by your caregiver.  Avoid stressful situations.  Plan a healthy lifestyle. This includes your diet, sleep, rest, exercise, and friends.  Find and practice a hobby you enjoy.  Join a fibromyalgia support group for interaction, ideas, and sharing advice. This may be helpful. SEEK MEDICAL CARE IF:  You are not having good results or improvement from your treatment. FOR MORE INFORMATION  National Fibromyalgia Association: www.fmaware.[place]: www.arthritis.org Document Released: 11/22/2005 Document Revised: 02/14/2012 Document Reviewed: 03/04/2010 [facility] Patient Information 2015 [place], [place]. This information is not intended to replace advice given to you by your health care provider. Make sure you discuss any questions you have with your health care provider.

## 2015-02-18 NOTE — Progress Notes (Signed)
Subjective:  Presents for c/o burning with urination and vaginal itching that began last week. Vaginal bleeding x 1 month. Bleeding today. Sees GYN. Some frequency. No urgency. Possible low grade fever. No vaginal discharge before cycle began. Had a day of vomiting and diarrhea last week which has resolved. Some back pain. No flank pain. Taking fluids well. Has a history of chronic severe anemia but this is stable. Has appt with neurologist tomorrow.   Objective:   BP 112/74 mmHg  Ht 5\' 6"  (1.676 m)  Wt   LMP 03/06/2008 NAD. Alert, oriented. Extreme pallor. Lungs clear. Heart RRR. Mild tenderness right CVA area. Abdomen soft, non distended with generalized abdominal tenderness; no specific area of tenderness.  Results for orders placed or performed in visit on 02/17/15  POCT hemoglobin  Result Value Ref Range   Hemoglobin 8.7 (A) 12.2 - 16.2 g/dL  POCT urinalysis dipstick  Result Value Ref Range   Color, UA     Clarity, UA     Glucose, UA     Bilirubin, UA     Ketones, UA     Spec Grav, UA 1.025    Blood, UA POS    pH, UA 6.5    Protein, UA     Urobilinogen, UA     Nitrite, UA     Leukocytes, UA small (1+)   POCT UA - Microscopic Only  Result Value Ref Range   WBC, Ur, HPF, POC 0    RBC, urine, microscopic 0    Bacteria, U Microscopic 0    Mucus, UA 0    Epithelial cells, urine per micros 0    Crystals, Ur, HPF, POC 0    Casts, Ur, LPF, POC 0    Yeast, UA 0      Assessment:  Problem List Items Addressed This Visit      Other   Iron deficiency anemia chronic/stable    Other Visit Diagnoses    Dysuria    -  Primary    Relevant Orders    POCT urinalysis dipstick (Completed)    Urine culture    POCT UA - Microscopic Only (Completed)    Dizzy        Relevant Orders    POCT hemoglobin (Completed)      Urine culture pending. Follow up with GYN for bleeding. Follow up with neurologist tomorrow as planned. Office visit here or GYN after cycle for pelvic if  needed. Return if symptoms worsen or fail to improve.

## 2015-02-18 NOTE — Patient Instructions (Signed)
SITZ BATHS WITHOUT SOAP at least once a day will help. Epsom salt may be added.

## 2015-02-19 LAB — URINE CULTURE

## 2015-02-22 ENCOUNTER — Encounter (HOSPITAL_COMMUNITY): Payer: Self-pay | Admitting: Emergency Medicine

## 2015-02-22 ENCOUNTER — Observation Stay (HOSPITAL_COMMUNITY)
Admission: EM | Admit: 2015-02-22 | Discharge: 2015-02-24 | Disposition: A | Discharge status: Home / Self Care | Payer: Self-pay | Attending: Internal Medicine | Admitting: Internal Medicine

## 2015-02-22 DIAGNOSIS — R569 Unspecified convulsions: Secondary | ICD-10-CM

## 2015-02-22 DIAGNOSIS — Z9884 Bariatric surgery status: Secondary | ICD-10-CM | POA: Insufficient documentation

## 2015-02-22 DIAGNOSIS — Z91040 Latex allergy status: Secondary | ICD-10-CM | POA: Insufficient documentation

## 2015-02-22 DIAGNOSIS — G4089 Other seizures: Principal | ICD-10-CM | POA: Insufficient documentation

## 2015-02-22 DIAGNOSIS — F418 Other specified anxiety disorders: Secondary | ICD-10-CM | POA: Diagnosis present

## 2015-02-22 DIAGNOSIS — G43909 Migraine, unspecified, not intractable, without status migrainosus: Secondary | ICD-10-CM | POA: Insufficient documentation

## 2015-02-22 DIAGNOSIS — F445 Conversion disorder with seizures or convulsions: Secondary | ICD-10-CM | POA: Diagnosis present

## 2015-02-22 DIAGNOSIS — Z885 Allergy status to narcotic agent status: Secondary | ICD-10-CM | POA: Insufficient documentation

## 2015-02-22 DIAGNOSIS — Z79899 Other long term (current) drug therapy: Secondary | ICD-10-CM | POA: Insufficient documentation

## 2015-02-22 DIAGNOSIS — F329 Major depressive disorder, single episode, unspecified: Secondary | ICD-10-CM | POA: Insufficient documentation

## 2015-02-22 DIAGNOSIS — Z888 Allergy status to other drugs, medicaments and biological substances status: Secondary | ICD-10-CM | POA: Insufficient documentation

## 2015-02-22 DIAGNOSIS — F419 Anxiety disorder, unspecified: Secondary | ICD-10-CM | POA: Insufficient documentation

## 2015-02-22 DIAGNOSIS — M797 Fibromyalgia: Secondary | ICD-10-CM | POA: Insufficient documentation

## 2015-02-22 DIAGNOSIS — Z87891 Personal history of nicotine dependence: Secondary | ICD-10-CM | POA: Insufficient documentation

## 2015-02-22 DIAGNOSIS — D509 Iron deficiency anemia, unspecified: Secondary | ICD-10-CM | POA: Diagnosis present

## 2015-02-22 LAB — COMPREHENSIVE METABOLIC PANEL
ALBUMIN: 4.3 g/dL (ref 3.5–5.2)
ALT: 14 U/L (ref 0–35)
AST: 23 U/L (ref 0–37)
Alkaline Phosphatase: 104 U/L (ref 39–117)
Anion gap: 10 (ref 5–15)
BUN: 16 mg/dL (ref 6–23)
CALCIUM: 9.2 mg/dL (ref 8.4–10.5)
CO2: 20 mmol/L (ref 19–32)
Chloride: 105 mmol/L (ref 96–112)
Creatinine, Ser: 0.89 mg/dL (ref 0.50–1.10)
GFR calc Af Amer: >90 mL/min (ref >90)
GFR calc non Af Amer: 88 mL/min — ABNORMAL LOW (ref >90)
GLUCOSE: 95 mg/dL (ref 70–99)
Potassium: 4.3 mmol/L (ref 3.5–5.1)
Sodium: 135 mmol/L (ref 135–145)
Total Bilirubin: 0.7 mg/dL (ref 0.3–1.2)
Total Protein: 7.8 g/dL (ref 6.0–8.3)

## 2015-02-22 LAB — URINE MICROSCOPIC-ADD ON

## 2015-02-22 LAB — CBC WITH DIFFERENTIAL/PLATELET
Basophils Absolute: 0 10*3/uL (ref 0.0–0.1)
Basophils Relative: 0 % (ref 0–1)
Eosinophils Absolute: 0.1 10*3/uL (ref 0.0–0.7)
Eosinophils Relative: 1 % (ref 0–5)
HCT: 30.4 % — ABNORMAL LOW (ref 36.0–46.0)
HEMOGLOBIN: 9.4 g/dL — AB (ref 12.0–15.0)
LYMPHS ABS: 2.6 10*3/uL (ref 0.7–4.0)
LYMPHS PCT: 35 % (ref 12–46)
MCH: 23.5 pg — ABNORMAL LOW (ref 26.0–34.0)
MCHC: 30.9 g/dL (ref 30.0–36.0)
MCV: 76 fL — AB (ref 78.0–100.0)
MONO ABS: 0.7 10*3/uL (ref 0.1–1.0)
MONOS PCT: 9 % (ref 3–12)
NEUTROS PCT: 55 % (ref 43–77)
Neutro Abs: 4 10*3/uL (ref 1.7–7.7)
PLATELETS: 414 10*3/uL — AB (ref 150–400)
RBC: 4 MIL/uL (ref 3.87–5.11)
RDW: 17.2 % — ABNORMAL HIGH (ref 11.5–15.5)
WBC: 7.4 10*3/uL (ref 4.0–10.5)

## 2015-02-22 LAB — I-STAT BETA HCG BLOOD, ED (MC, WL, AP ONLY)

## 2015-02-22 LAB — URINALYSIS, ROUTINE W REFLEX MICROSCOPIC
BILIRUBIN URINE: NEGATIVE
GLUCOSE, UA: NEGATIVE mg/dL
KETONES UR: NEGATIVE mg/dL
NITRITE: NEGATIVE
Protein, ur: NEGATIVE mg/dL
Specific Gravity, Urine: 1.009 (ref 1.005–1.030)
UROBILINOGEN UA: 0.2 mg/dL (ref 0.0–1.0)
pH: 6 (ref 5.0–8.0)

## 2015-02-22 LAB — RAPID URINE DRUG SCREEN, HOSP PERFORMED
AMPHETAMINES: NOT DETECTED
BARBITURATES: NOT DETECTED
BENZODIAZEPINES: POSITIVE — AB
COCAINE: NOT DETECTED
OPIATES: NOT DETECTED
Tetrahydrocannabinol: NOT DETECTED

## 2015-02-22 LAB — PREGNANCY, URINE: Preg Test, Ur: NEGATIVE

## 2015-02-22 LAB — ETHANOL: Alcohol, Ethyl (B): 20 mg/dL — ABNORMAL HIGH (ref 0–9)

## 2015-02-22 MED ORDER — LORAZEPAM 2 MG/ML IJ SOLN
1.0000 mg | Freq: Once | INTRAMUSCULAR | Status: AC
  Administered 2015-02-22: 1 mg via INTRAVENOUS
  Filled 2015-02-22: qty 1

## 2015-02-22 MED ORDER — LORAZEPAM 2 MG/ML IJ SOLN
2.0000 mg | Freq: Once | INTRAMUSCULAR | Status: AC
  Administered 2015-02-22: 2 mg via INTRAVENOUS

## 2015-02-22 MED ORDER — LORAZEPAM 2 MG/ML IJ SOLN
1.0000 mg | INTRAMUSCULAR | Status: DC | PRN
  Administered 2015-02-23 – 2015-02-24 (×2): 1 mg via INTRAVENOUS
  Filled 2015-02-22 (×2): qty 1

## 2015-02-22 MED ORDER — HYDROMORPHONE HCL 1 MG/ML IJ SOLN
0.5000 mg | Freq: Once | INTRAMUSCULAR | Status: AC
  Administered 2015-02-22: 0.5 mg via INTRAVENOUS
  Filled 2015-02-22: qty 1

## 2015-02-22 MED ORDER — LORAZEPAM 2 MG/ML IJ SOLN
1.0000 mg | Freq: Once | INTRAMUSCULAR | Status: AC
  Administered 2015-02-22: 1 mg via INTRAVENOUS

## 2015-02-22 MED ORDER — PROMETHAZINE HCL 25 MG/ML IJ SOLN
12.5000 mg | Freq: Four times a day (QID) | INTRAMUSCULAR | Status: DC | PRN
  Administered 2015-02-23 (×2): 12.5 mg via INTRAVENOUS
  Filled 2015-02-22 (×2): qty 1

## 2015-02-22 MED ORDER — LEVETIRACETAM IN NACL 1000 MG/100ML IV SOLN
1000.0000 mg | Freq: Once | INTRAVENOUS | Status: AC
  Administered 2015-02-22: 1000 mg via INTRAVENOUS
  Filled 2015-02-22: qty 100

## 2015-02-22 NOTE — ED Notes (Signed)
Pt having grand mal seizure.

## 2015-02-22 NOTE — ED Notes (Signed)
Pt talking to neurologist.

## 2015-02-22 NOTE — ED Notes (Signed)
Pt seizing again. Airway intact. Lasted 15 seconds.

## 2015-02-22 NOTE — ED Notes (Signed)
Pt given 1 of ativan.

## 2015-02-22 NOTE — ED Notes (Signed)
Pt had another seizure, pt is now alert. Per Dr. Doy Mince, keep patient on oxygen, no more ativan to be given. Airway intact. Saturations 100% on 2L

## 2015-02-22 NOTE — ED Notes (Signed)
Dr. Jeanell Sparrow at bedside. Respiratory at bedside.

## 2015-02-22 NOTE — ED Notes (Signed)
Pt witnessed having another seizure, lasting 20 seconds.

## 2015-02-22 NOTE — ED Notes (Signed)
Airway intact

## 2015-02-22 NOTE — ED Notes (Addendum)
Pt having another seizure. Lasting 15 seconds.

## 2015-02-22 NOTE — ED Notes (Signed)
Pt alert, pulling off Faxon.

## 2015-02-22 NOTE — ED Notes (Signed)
Pt given another 2mg  ativan due to continued seizures.

## 2015-02-22 NOTE — ED Notes (Signed)
Dr. Reynolds at bedside.

## 2015-02-22 NOTE — ED Notes (Signed)
Pt given hot pack for head

## 2015-02-22 NOTE — ED Notes (Signed)
Pt having anoher seizure. Lasting 15 seconds. Airway continues to be intact. Dr. Doy Mince at bedside with respiratory. No new orders.

## 2015-02-22 NOTE — H&P (Signed)
[facility] Admission History and Physical       Penny [name] NTZ:[id number] DOB: 06/27/1986 DOA: 02/22/2015  Referring physician: EDP PCP: Penny Lange, MD  Specialists:   Chief Complaint: Seizure -Like Activity  HPI: Penny [name] is a 29 y.o. female with history of Pseudoseizures ( Previous Work up at [facility] in 07/2014), and history of Post Partum Depression, Migraines,  and Anxiety was  Found outside of the [employer] with Seizure -like activity described as tonic Clonic and posturing without loss of consciousness lasting 10 -125 minutes and then recurred.   She was medicated with IV Ativan and loaded with IV Keppra, and Neurology was consulted and saw her in the ED.   She was referred for further evaluation.   She has complaints of a Migraine Headache at this time.     Review of Systems:  Constitutional: No Weight Loss, No Weight Gain, Night Sweats, Fevers, Chills, Dizziness, Light Headedness, Fatigue, or Generalized Weakness HEENT: No Headaches, Difficulty Swallowing,Tooth/Dental Problems,Sore Throat,  No Sneezing, Rhinitis, Ear Ache, Nasal Congestion, or Post Nasal Drip,  Cardio-vascular:  No Chest pain, Orthopnea, PND, Edema in Lower Extremities, Anasarca, Dizziness, Palpitations  Resp: No Dyspnea, No DOE, No Productive Cough, No Non-Productive Cough, No Hemoptysis, No Wheezing.    GI: No Heartburn, Indigestion, Abdominal Pain, Nausea, Vomiting, Diarrhea, Constipation, Hematemesis, Hematochezia, Melena, Change in Bowel Habits,  Loss of Appetite  GU: No Dysuria, No Change in Color of Urine, No Urgency or Urinary Frequency, No Flank pain.  Musculoskeletal: No Joint Pain or Swelling, No Decreased Range of Motion, No Back Pain.  Neurologic: No Syncope, +Seizure-Like Activity, Muscle Weakness, Paresthesia, Vision Disturbance or Loss, No Diplopia, No Vertigo, No Difficulty Walking,  Skin: No Rash or Lesions. Psych: No Change in Mood or Affect, +Depression and  Anxiety, No Memory loss, No Confusion, or Hallucinations   Past Medical History  Diagnosis Date  . Migraines   . Interstitial cystitis   . IUD FEB 2010  . Gastritis JULY 2011  . Depression   . 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 Fatty liver   . Obesity (BMI 30-39.9) 2011 228 LBS BMI 36.8  . Iron deficiency anemia 07/23/2010  . Irritable bowel syndrome 2012 DIARRHEA    JUN 2012 TTG IgA 14.9  . Elevated liver enzymes JUL 2011ALK PHOS 111-127 AST  143-267 ALT  213-321T BILI 0.6  ALB  3.7-4.06 Jun 2011 ALK PHOS 118 AST 24 ALT 42 T BILI 0.4 ALB 3.9  . Chronic daily headache   . Ovarian cyst   . PONV (postoperative nausea and vomiting)   . ADD (attention deficit disorder)   . RLQ abdominal pain 07/18/2013  . Menorrhagia 07/18/2013  . Anxiety   . Potassium (K) deficiency   . Dysmenorrhea 09/18/2013  . Stress 09/18/2013  . Patient desires pregnancy 09/18/2013  . Pregnant 12/25/2013  . Blood transfusion without reported diagnosis   . Heart murmur   . Gastric bypass status for obesity   . 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 Dysrhythmia   . Sciatica of left side 11/14/2014  . Fibromyalgia   . 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     Past Surgical History  Procedure Laterality Date  . Gab  2007    in [place]-POUCH 5 CM  . Cholecystectomy  2005    biliary dyskinesia  . Cath self every nite      for sodium bicarb injection (discontinued  2013)  . Colonoscopy  JUN 2012 ABD PN/DIARRHEA WITH PROPOFOL    NL COLON  . Upper gastrointestinal endoscopy  JULY 2011 NAUSEA-D125,V6, PH 25    Bx; GASTRITIS, POUCH-5 CM LONG  . Dilation and curettage of uterus    . Tonsillectomy    . Gastric bypass  06/2006  . Savory dilation  06/20/2012    AJO:INOM gastritis/Ulcer in the mid jejunum  . Tonsillectomy and adenoidectomy    . Esophagogastroduodenoscopy    . Wisdom tooth extraction    . Repair vaginal cuff N/A 07/30/2014    Procedure: REPAIR VAGINAL CUFF;   Surgeon: Mora Bellman, MD;  Location: [facility];  Service: Gynecology;  Laterality: N/A;  . Hysteroscopy w/d&c N/A 09/12/2014    Procedure: DILATATION AND CURETTAGE /HYSTEROSCOPY;  Surgeon: Jonnie Kind, MD;  Location: [facility];  Service: Gynecology;  Laterality: N/A;      Prior to Admission medications   Medication Sig Start Date End Date Taking? Authorizing Provider  Clobetasol Prop Emollient Base 0.05 % emollient cream Apply small amount to affected area BID Patient taking differently: Apply 1 application topically 2 (two) times daily. Use for 3 weeks starting 02/17/15 02/18/15  Yes Christin Fudge, CNM  clonazePAM (KLONOPIN) 0.5 MG tablet Take 1 tablet (0.5 mg total) by mouth 2 (two) times daily as needed for anxiety. Patient taking differently: Take 0.5 mg by mouth 3 (three) times daily.  12/26/14  Yes Kathyrn Drown, MD  DULoxetine (CYMBALTA) 30 MG capsule Take 1 capsule (30 mg total) by mouth daily. Patient taking differently: Take 30-60 mg by mouth 2 (two) times daily. Take 1 capsule (30 mg) every morning and 2 capsules (60 mg) every night 02/18/15  Yes Kathrynn Ducking, MD  ferrous sulfate 325 (65 FE) MG tablet Take 1 tablet (325 mg total) by mouth 2 (two) times daily with a meal. 02/06/15  Yes Okey Regal, PA-C  ketorolac (TORADOL) 10 MG tablet Take 1 tablet (10 mg total) by mouth every 8 (eight) hours as needed for moderate pain. 02/12/15  Yes Kathrynn Ducking, MD  Prenatal Vit-Fe Fumarate-FA (PRENATAL MULTIVITAMIN) TABS tablet Take 1 tablet by mouth daily at 12 noon.   Yes Historical Provider, MD  topiramate (TOPAMAX) 100 MG tablet Take 1 tablet (100 mg total) by mouth at bedtime. 02/18/15  Yes Kathrynn Ducking, MD  zolpidem (AMBIEN) 5 MG tablet Take 1 tablet (5 mg total) by mouth at bedtime as needed for sleep. 12/26/14  Yes Kathyrn Drown, MD  DULoxetine (CYMBALTA) 60 MG capsule Take 1 capsule (60 mg total) by mouth daily. Patient not taking: Reported on 02/22/2015 12/26/14   Kathyrn Drown, MD     Allergies  Allergen Reactions  . Gabapentin Other (See Comments)    Pt states that she was unresponsive after taking this medication, but her vitals remained stable.    . Metoclopramide Hcl Anxiety and Other (See Comments)    Pt states that she felt like she was trapped in a box, and could not get out.  Pt also states that she had temporary loss of movement, weakness, and tingling.    . Codeine Nausea And Vomiting  . Zofran [Ondansetron] Other (See Comments)    Migraines   . Latex Rash  . Tape Itching and Rash    Please use paper tape    Social History:  reports that she quit smoking about 13 months ago. Her smoking use included Cigarettes. She has a 2.5 pack-year smoking history.  She has never used smokeless tobacco. She reports that she does not drink alcohol or use illicit drugs.    Family History  Problem Relation Age of Onset  . Hemochromatosis Maternal Grandmother   . Migraines Maternal Grandmother   . Cancer Maternal Grandmother   . Breast cancer Maternal Grandmother   . Hypertension Father   . Diabetes Father   . Coronary artery disease Father   . Migraines Paternal Grandmother   . Breast cancer Paternal Grandmother   . Cancer Mother     breast  . Hemochromatosis Mother   . Breast cancer Mother   . Coronary artery disease Paternal Grandfather        Physical Exam:  GEN:  Pleasant Obese  29 y.o. Caucasian female examined and in no acute distress; cooperative with exam Filed Vitals:   02/22/15 2230 02/22/15 2245 02/22/15 2300 02/22/15 2327  BP: 130/81 118/68 109/64 128/89  Pulse: 91 88 83 99  Temp:      TempSrc:      Resp: $Remo'20 21 17 20  'goSif$ SpO2: 99% 100% 100% 100%   Blood pressure 128/89, pulse 99, temperature 97.5 F (36.4 C), temperature source Axillary, resp. rate 20, last menstrual period 03/06/2008, SpO2 100 %, not currently breastfeeding. PSYCH: She is alert and oriented x4; does not appear anxious does not appear depressed; affect is  normal HEENT: Normocephalic and Atraumatic, Mucous membranes pink; PERRLA; EOM intact; Fundi:  Benign;  No scleral icterus, Nares: Patent, Oropharynx: Clear, Fair Dentition,    Neck:  FROM, No Cervical Lymphadenopathy nor Thyromegaly or Carotid Bruit; No JVD; Breasts:: Not examined CHEST WALL: No tenderness CHEST: Normal respiration, clear to auscultation bilaterally HEART: Regular rate and rhythm; no murmurs rubs or gallops BACK: No kyphosis or scoliosis; No CVA tenderness ABDOMEN: Positive Bowel Sounds,  Obese, Soft Non-Tender, No Rebound or Guarding; No Masses, No Organomegaly. Rectal Exam: Not done EXTREMITIES: No Cyanosis, Clubbing, or Edema; No Ulcerations. Genitalia: not examined PULSES: 2+ and symmetric SKIN: Normal hydration no rash or ulceration CNS:  Alert and Oriented x 4, No Focal Deficits Vascular: pulses palpable throughout    Labs on Admission:  Basic Metabolic Panel:  Recent Labs Lab 02/22/15 2050  NA 135  K 4.3  CL 105  CO2 20  GLUCOSE 95  BUN 16  CREATININE 0.89  CALCIUM 9.2   Liver Function Tests:  Recent Labs Lab 02/22/15 2050  AST 23  ALT 14  ALKPHOS 104  BILITOT 0.7  PROT 7.8  ALBUMIN 4.3   No results for input(s): LIPASE, AMYLASE in the last 168 hours. No results for input(s): AMMONIA in the last 168 hours. CBC:  Recent Labs Lab 02/17/15 1337 02/18/15 1152 02/22/15 2050  WBC  --   --  7.4  NEUTROABS  --   --  4.0  HGB 8.7* 8.1* 9.4*  HCT  --   --  30.4*  MCV  --   --  76.0*  PLT  --   --  414*   Cardiac Enzymes: No results for input(s): CKTOTAL, CKMB, CKMBINDEX, TROPONINI in the last 168 hours.  BNP (last 3 results) No results for input(s): BNP in the last 8760 hours.  ProBNP (last 3 results) No results for input(s): PROBNP in the last 8760 hours.  CBG: No results for input(s): GLUCAP in the last 168 hours.  Radiological Exams on Admission: No results found.   EKG: Independently reviewed.    Assessment/Plan:   29  y.o. female with  Principal Problem:  1.   Seizure-like activity   Telemetry Monitoring   Seizure Precautions   PRN IV Ativan   Loaded with IV Keppra in ED   EEG in AM   Neuro Consult performred in ED   Psych evaluation in AM   Active Problems:   2.   Pseudoseizure- Hx     3.   Iron deficiency anemia   Continue Iron Rx     4.   Depression with anxiety   Continue Cymbalta and Klonopin Rx      5.   Migraine Headaches   Continue Topamax   PRN IV Dilaudid      6.   DVT Prophylaxis   SCDs          Code Status:     FULL CODE       Family Communication:   Mother at Bedside     Disposition Plan:   Observation Status        Time spent:  [place] [facility] Pager [phone]   If [place] Please Contact the Day Rounding Team MD for [facility]  If 7PM-7AM, Please Contact Night-Floor Coverage  [email or web address] Password [facility] 02/22/2015, 11:38 PM

## 2015-02-22 NOTE — ED Notes (Signed)
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 Employees  witnessed grand mal seizure. Pt did not fall or hit her head. Brought into trama A. Pt post ictal. Pt again had a sizure at [place] and given 1 ativan per Dr. Jeanell Sparrow.

## 2015-02-22 NOTE — ED Provider Notes (Signed)
CSN: [id number]     Arrival date & time 02/22/15  1942 History   First MD Initiated Contact with Patient 02/22/15 1945     Chief Complaint  Patient presents with  . 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  She was unresponsive during this time.  Patient was moved to exam room on stretcher.  She was placed on oxygen, monitor, and iv started.  She received ativan 6 mg in interval doses.  She intermittently had tonic activity and was unresponsive.   Past Medical History  Diagnosis Date  . Migraines   . Interstitial cystitis   . IUD FEB 2010  . Gastritis JULY 2011  . Depression   . 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 Fatty liver   . Obesity (BMI 30-39.9) 2011 228 LBS BMI 36.8  . Iron deficiency anemia 07/23/2010  . Irritable bowel syndrome 2012 DIARRHEA    JUN 2012 TTG IgA 14.9  . Elevated liver enzymes JUL 2011ALK PHOS 111-127 AST  143-267 ALT  213-321T BILI 0.6  ALB  3.7-4.06 Jun 2011 ALK PHOS 118 AST 24 ALT 42 T BILI 0.4 ALB 3.9  . Chronic daily headache   . Ovarian cyst   . PONV (postoperative nausea and vomiting)   . ADD (attention deficit disorder)   . RLQ abdominal pain 07/18/2013  . Menorrhagia 07/18/2013  . Anxiety   . Potassium (K) deficiency   . Dysmenorrhea 09/18/2013  . Stress 09/18/2013  . Patient desires pregnancy 09/18/2013  . Pregnant 12/25/2013  . Blood transfusion without reported diagnosis   . Heart murmur   . Gastric bypass status for obesity   . 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 Dysrhythmia   . Sciatica of left side 11/14/2014  . Fibromyalgia   . 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 Gab  2007    in [place]-POUCH 5 CM  . Cholecystectomy  2005    biliary dyskinesia  . Cath self every nite      for sodium bicarb injection (discontinued 2013)  . Colonoscopy  JUN 2012 ABD PN/DIARRHEA WITH PROPOFOL    NL COLON  . Upper gastrointestinal endoscopy  JULY 2011 NAUSEA-D125,V6, PH 25    Bx; GASTRITIS, POUCH-5 CM LONG  . Dilation and curettage of uterus    . Tonsillectomy    . Gastric bypass  06/2006  . Savory dilation  06/20/2012    YBF:XOVA gastritis/Ulcer in the mid jejunum  . Tonsillectomy and adenoidectomy    . Esophagogastroduodenoscopy    . Wisdom tooth extraction    . Repair vaginal cuff N/A 07/30/2014    Procedure: REPAIR VAGINAL CUFF;  Surgeon: Mora Bellman, MD;  Location: [facility];  Service: Gynecology;  Laterality: N/A;  . Hysteroscopy w/d&c N/A 09/12/2014    Procedure: DILATATION AND CURETTAGE /HYSTEROSCOPY;  Surgeon: Jonnie Kind, MD;  Location: [facility];  Service: Gynecology;  Laterality: N/A;   Family History  Problem Relation Age of Onset  . Hemochromatosis Maternal Grandmother   .  Migraines Maternal Grandmother   . Cancer Maternal Grandmother   . Breast cancer Maternal Grandmother   . Hypertension Father   . Diabetes Father   . Coronary artery disease Father   . Migraines Paternal Grandmother   . Breast cancer Paternal Grandmother   . Cancer Mother     breast  . Hemochromatosis Mother   . Breast cancer Mother   . Coronary artery disease Paternal Grandfather    History  Substance Use Topics  . Smoking status: Former Smoker -- 0.50 packs/day for 5 years    Types: Cigarettes    Quit date: 01/07/2014  . Smokeless tobacco: Never Used  . Alcohol Use: No   OB History    Gravida Para Term Preterm AB TAB SAB Ectopic Multiple Living   _0 Review of Systems  Unable to perform ROS     Allergies  Gabapentin; Metoclopramide hcl; Topamax; Codeine; Zofran; Latex; and Tape  Home Medications   Prior to Admission medications   Medication Sig Start Date End Date Taking? Authorizing Provider  Clobetasol Prop Emollient Base 0.05 % emollient cream Apply small amount to affected area BID 02/18/15   Christin Fudge, CNM  clonazePAM (KLONOPIN) 0.5 MG tablet Take 1 tablet (0.5 mg total) by mouth 2 (two) times daily as needed for anxiety. 12/26/14   Kathyrn Drown, MD  cyclobenzaprine (FLEXERIL) 5 MG tablet Take 5 mg by mouth every 8 (eight) hours as needed for muscle spasms.    Historical Provider, MD  DULoxetine (CYMBALTA) 30 MG capsule Take 1 capsule (30 mg total) by mouth daily. 02/18/15   Kathrynn Ducking, MD  DULoxetine (CYMBALTA) 60 MG capsule Take 1 capsule (60 mg total) by mouth daily. Patient not taking: Reported on 02/18/2015 12/26/14   Kathyrn Drown, MD  ferrous sulfate 325 (65 FE) MG tablet Take 1 tablet (325 mg total) by mouth 2 (two) times daily with a meal. 02/06/15   Okey Regal, PA-C  ketorolac (TORADOL) 10 MG tablet Take 1 tablet (10 mg total) by mouth every 8 (eight) hours as needed for moderate pain. 02/12/15   Kathrynn Ducking, MD  topiramate (TOPAMAX) 100 MG tablet Take 1 tablet (100 mg total) by mouth at bedtime. 02/18/15   Kathrynn Ducking, MD  zolpidem (AMBIEN) 5 MG tablet Take 1 tablet (5 mg total) by mouth at bedtime as needed for sleep. 12/26/14   Kathyrn Drown, MD   BP 122/79 mmHg  Pulse 101  Temp(Src) 97.5 F (36.4 C) (Axillary)  Resp 18  SpO2 100%  LMP 03/06/2008 Physical Exam  Constitutional: She is oriented to person, place, and time. She appears well-developed and well-nourished.  HENT:  Head: Normocephalic and atraumatic.  Right Ear: External ear normal.  Left Ear: External ear normal.  Nose: Nose normal.  Mouth/Throat: Oropharynx is clear and moist.  Eyes: Conjunctivae and EOM are  normal. Pupils are equal, round, and reactive to light.  Neck: Normal range of motion. Neck supple.  Cardiovascular: Normal rate, regular rhythm, normal heart sounds and intact distal pulses.   Pulmonary/Chest: Effort normal and breath sounds normal.  Abdominal: Soft. Bowel sounds are normal.  Musculoskeletal: Normal range of motion.  Neurological: She is oriented to person, place, and time.  Tonic clonic activity followed by tonic activity lasting seconds then relaxed phase.  All four extremities moving equally,  Perrl.   Skin: Skin is warm and dry.  Psychiatric:  She has a normal mood and affect. Her behavior is normal. Judgment and thought content normal.  Nursing note and vitals reviewed.   ED Course  Procedures (including critical care time) Labs Review Labs Reviewed  CBC WITH DIFFERENTIAL/PLATELET - Abnormal; Notable for the following:    Hemoglobin 9.4 (*)    HCT 30.4 (*)    MCV 76.0 (*)    MCH 23.5 (*)    RDW 17.2 (*)    Platelets 414 (*)    All other components within normal limits  COMPREHENSIVE METABOLIC PANEL - Abnormal; Notable for the following:    GFR calc non Af Amer 88 (*)    All other components within normal limits  ETHANOL - Abnormal; Notable for the following:    Alcohol, Ethyl (B) 20 (*)    All other components within normal limits  URINE RAPID DRUG SCREEN (HOSP PERFORMED)  URINALYSIS, ROUTINE W REFLEX MICROSCOPIC  PREGNANCY, URINE  I-STAT BETA HCG BLOOD, ED ([facility], [facility], [facility] ONLY)  I-STAT CHEM 8, ED    Imaging Review No results found.   EKG Interpretation None      MDM   1- seizure/pseudo seizure 29 y.o. Female with history of pseudoseizures who presented today with seizure activity.  Neurology consulted and saw during event.  Dr. Doy Mince examined and consulted and feels c.w. Pseudoseizure.  Given extended episode and medications plan observation tonight with psychiatry consult in am.   2- anemia- stable.   Pattricia Boss, MD 02/26/15 [phone]

## 2015-02-22 NOTE — ED Notes (Signed)
Pt seizing again, given 1mg  ativan per Dr. Jeanell Sparrow

## 2015-02-22 NOTE — ED Notes (Addendum)
Pt seizing again, given another 1mg  ativan. Lasted 15 seconds

## 2015-02-22 NOTE — Consult Note (Signed)
Reason for Consult:Seizures Referring Physician: Ray  CC: Seizures  HPI: Penny [name] is an 29 y.o. female with a history of EMU proven nonepileptic seizures who today was found in the break room lying on a chair with extending extremities and abnormal breathing.  Patient was unresponsive.  Patient was felt to be having a seizure and was transported to the ED.  Patient was continuing to have what was felt to be seizure activity and was administered $RemoveBeforeDE'6mg'DfbwrHCXOxyvZab$  of Ativan with continued activity.  Consult as called for further recommendations. Patient has a history of nonepileptic events.  Has been monitored at [facility].  Episodes described during monitoring appear to be the same patient is presenting with this evening.  Friends agree.  Patient had no epileptic correlate with these events.  She has not had any in the past 5 months or so.  Has been seen by psychiatry and neurology since that time.  Is unhappy with her psychiatric services.  He husband has left her within the last week, adding to her stressors.   Currently complains of a headache which is common after these events.    Past Medical History  Diagnosis Date  . Migraines   . Interstitial cystitis   . IUD FEB 2010  . Gastritis JULY 2011  . Depression   . 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 Fatty liver   . Obesity (BMI 30-39.9) 2011 228 LBS BMI 36.8  . Iron deficiency anemia 07/23/2010  . Irritable bowel syndrome 2012 DIARRHEA    JUN 2012 TTG IgA 14.9  . Elevated liver enzymes JUL 2011ALK PHOS 111-127 AST  143-267 ALT  213-321T BILI 0.6  ALB  3.7-4.06 Jun 2011 ALK PHOS 118 AST 24 ALT 42 T BILI 0.4 ALB 3.9  . Chronic daily headache   . Ovarian cyst   . PONV (postoperative nausea and vomiting)   . ADD (attention deficit disorder)   . RLQ abdominal pain 07/18/2013  . Menorrhagia 07/18/2013  . Anxiety   . Potassium (K) deficiency   . Dysmenorrhea 09/18/2013  . Stress 09/18/2013  . Patient desires pregnancy 09/18/2013  . Pregnant  12/25/2013  . Blood transfusion without reported diagnosis   . Heart murmur   . Gastric bypass status for obesity   . 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 Dysrhythmia   . Sciatica of left side 11/14/2014  . Fibromyalgia   . 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    Past Surgical History  Procedure Laterality Date  . Gab  2007    in [place]-POUCH 5 CM  . Cholecystectomy  2005    biliary dyskinesia  . Cath self every nite      for sodium bicarb injection (discontinued 2013)  . Colonoscopy  JUN 2012 ABD PN/DIARRHEA WITH PROPOFOL    NL COLON  . Upper gastrointestinal endoscopy  JULY 2011 NAUSEA-D125,V6, PH 25    Bx; GASTRITIS, POUCH-5 CM LONG  . Dilation and curettage of uterus    . Tonsillectomy    . Gastric bypass  06/2006  . Savory dilation  06/20/2012    RDE:YCXK gastritis/Ulcer in the mid jejunum  . Tonsillectomy and adenoidectomy    . Esophagogastroduodenoscopy    . Wisdom tooth extraction    . Repair vaginal cuff N/A 07/30/2014    Procedure: REPAIR VAGINAL CUFF;  Surgeon: Mora Bellman, MD;  Location: [facility];  Service: Gynecology;  Laterality: N/A;  . Hysteroscopy w/d&c N/A 09/12/2014  Procedure: DILATATION AND CURETTAGE /HYSTEROSCOPY;  Surgeon: Jonnie Kind, MD;  Location: [facility];  Service: Gynecology;  Laterality: N/A;    Family History  Problem Relation Age of Onset  . Hemochromatosis Maternal Grandmother   . Migraines Maternal Grandmother   . Cancer Maternal Grandmother   . Breast cancer Maternal Grandmother   . Hypertension Father   . Diabetes Father   . Coronary artery disease Father   . Migraines Paternal Grandmother   . Breast cancer Paternal Grandmother   . Cancer Mother     breast  . Hemochromatosis Mother   . Breast cancer Mother   . Coronary artery disease Paternal Grandfather     Social History:  reports that she quit smoking about 13 months ago. Her smoking use included Cigarettes. She has a 2.5 pack-year smoking  history. She has never used smokeless tobacco. She reports that she does not drink alcohol or use illicit drugs.  Allergies  Allergen Reactions  . Gabapentin Other (See Comments)    Pt states that she was unresponsive after taking this medication, but her vitals remained stable.    . Metoclopramide Hcl Anxiety and Other (See Comments)    Pt states that she felt like she was trapped in a box, and could not get out.  Pt also states that she had temporary loss of movement, weakness, and tingling.    . Topamax [Topiramate] Shortness Of Breath and Other (See Comments)    Adverse side effects.  Pt states this medication changed her taste for a short period of time and caused numbness and tingling in extremities.    . Codeine Nausea And Vomiting  . Zofran [Ondansetron] Other (See Comments)    Migraines   . Latex Rash  . Tape Itching and Rash    Medications: I have reviewed the patient's current medications. Prior to Admission:  Current outpatient prescriptions:  .  Clobetasol Prop Emollient Base 0.05 % emollient cream, Apply small amount to affected area BID, Disp: 30 g,  .  clonazePAM (KLONOPIN) 0.5 MG tablet, Take 1 tablet (0.5 mg total) by mouth 2 (two) times daily as needed for anxiety., Disp: 30 tablet, Rfl: 2 .  cyclobenzaprine (FLEXERIL) 5 MG tablet, Take 5 mg by mouth every 8 (eight) hours as needed for muscle spasms., Disp: , Rfl:  .  DULoxetine (CYMBALTA) 30 MG capsule, Take 1 capsule (30 mg total) by mouth daily., Disp: 30 capsule, Rfl: 3 .  DULoxetine (CYMBALTA) 60 MG capsule, Take 1 capsule (60 mg total) by mouth daily. (Patient not taking: Reported on 02/18/2015), Disp: 30 capsule, Rfl: 3 .  ferrous sulfate 325 (65 FE) MG tablet, Take 1 tablet (325 mg total) by mouth 2 (two) times daily with a meal., Disp: 30 tablet, Rfl: 0 .  ketorolac (TORADOL) 10 MG tablet, Take 1 tablet (10 mg total) by mouth every 8 (eight) hours as needed for moderate pain., Disp: 30 tablet, Rfl: 0 .   topiramate (TOPAMAX) 100 MG tablet, Take 1 tablet (100 mg total) by mouth at bedtime., Disp: 30 tablet, Rfl: 3 .  zolpidem (AMBIEN) 5 MG tablet, Take 1 tablet (5 mg total) by mouth at bedtime as needed for sleep., Disp: 30 tablet, Rfl: 2  ROS: History obtained from the patient  General ROS: negative for - chills, fatigue, fever, night sweats, weight gain or weight loss Psychological ROS: negative for - behavioral disorder, hallucinations, memory difficulties, mood swings or suicidal ideation Ophthalmic ROS: negative for - blurry vision, double vision,  eye pain or loss of vision ENT ROS: negative for - epistaxis, nasal discharge, oral lesions, sore throat, tinnitus or vertigo Allergy and Immunology ROS: negative for - hives or itchy/watery eyes Hematological and Lymphatic ROS: negative for - bleeding problems, bruising or swollen lymph nodes Endocrine ROS: negative for - galactorrhea, hair pattern changes, polydipsia/polyuria or temperature intolerance Respiratory ROS: negative for - cough, hemoptysis, shortness of breath or wheezing Cardiovascular ROS: negative for - chest pain, dyspnea on exertion, edema or irregular heartbeat Gastrointestinal ROS: negative for - abdominal pain, diarrhea, hematemesis, nausea/vomiting or stool incontinence Genito-Urinary ROS: negative for - dysuria, hematuria, incontinence or urinary frequency/urgency Musculoskeletal ROS: chronic back and lower extremity pain Neurological ROS: as noted in HPI Dermatological ROS: negative for rash and skin lesion changes  Physical Examination: Blood pressure 122/79, pulse 101, temperature 97.5 F (36.4 C), temperature source Axillary, resp. rate 18, last menstrual period 03/06/2008, SpO2 100 %, not currently breastfeeding.  HEENT-  Normocephalic, no lesions, without obvious abnormality.  Normal external eye and conjunctiva.  Normal TM's bilaterally.  Normal auditory canals and external ears. Normal external nose, mucus  membranes and septum.  Normal pharynx. Cardiovascular- S1, S2 normal, pulses palpable throughout   Lungs- chest clear, no wheezing, rales, normal symmetric air entry Abdomen- soft, non-tender; bowel sounds normal; no masses,  no organomegaly Extremities- no edema Lymph-no adenopathy palpable Musculoskeletal-no joint tenderness, deformity or swelling Skin-warm and dry, no hyperpigmentation, vitiligo, or suspicious lesions  Neurological Examination Mental Status: Alert, oriented, thought content appropriate.  Speech fluent without evidence of aphasia.  Able to follow 3 step commands without difficulty. Cranial Nerves: II: Discs flat bilaterally; Visual fields grossly normal, pupils equal, round, reactive to light and accommodation III,IV, VI: ptosis not present, extra-ocular motions intact bilaterally V,VII: smile symmetric, facial light touch sensation normal bilaterally VIII: hearing normal bilaterally IX,X: gag reflex present XI: bilateral shoulder shrug XII: midline tongue extension Motor: Right : Upper extremity   5/5    Left:     Upper extremity   5/5  Lower extremity   5/5     Lower extremity   5/5 Tone and bulk:normal tone throughout; no atrophy noted Sensory: Pinprick and light touch intact throughout, bilaterally Deep Tendon Reflexes: 2+ and symmetric with absent AJs bilaterally Plantars: Right: downgoing   Left: downgoing Cerebellar: normal finger-to-nose and normal heel-to-shin testing bilaterally Gait: not tested due to safety concerns   Laboratory Studies:   Basic Metabolic Panel: No results for input(s): NA, K, CL, CO2, GLUCOSE, BUN, CREATININE, CALCIUM, MG, PHOS in the last 168 hours.  Liver Function Tests: No results for input(s): AST, ALT, ALKPHOS, BILITOT, PROT, ALBUMIN in the last 168 hours. No results for input(s): LIPASE, AMYLASE in the last 168 hours. No results for input(s): AMMONIA in the last 168 hours.  CBC:  Recent Labs Lab 02/17/15 1337  02/18/15 1152  HGB 8.7* 8.1*    Cardiac Enzymes: No results for input(s): CKTOTAL, CKMB, CKMBINDEX, TROPONINI in the last 168 hours.  BNP: Invalid input(s): POCBNP  CBG: No results for input(s): GLUCAP in the last 168 hours.  Microbiology: Results for orders placed or performed in visit on 02/17/15  Urine culture     Status: None   Collection Time: 02/17/15 12:00 AM  Result Value Ref Range Status   Urine Culture, Routine Final report  Final   Result 1 Comment  Final    Comment: Culture shows less than 10,000 colony forming units of bacteria per milliliter of urine. This colony count  is not generally considered to be clinically significant.     Coagulation Studies: No results for input(s): LABPROT, INR in the last 72 hours.  Urinalysis:  Recent Labs Lab 02/17/15 1337  LEUKOCYTESUR small (1+)    Lipid Panel:     Component Value Date/Time   CHOL 95 03/10/2012 0642   TRIG 50 03/10/2012 0642   HDL 38* 03/10/2012 0642   CHOLHDL 2.5 03/10/2012 0642   VLDL 10 03/10/2012 0642   LDLCALC 47 03/10/2012 0642    HgbA1C:  Lab Results  Component Value Date   HGBA1C 5.7* 01/15/2015    Urine Drug Screen:     Component Value Date/Time   LABOPIA NONE DETECTED 08/13/2014 0149   LABOPIA NEG 02/18/2014 1234   COCAINSCRNUR NONE DETECTED 08/13/2014 0149   COCAINSCRNUR NEG 02/18/2014 1234   LABBENZ NONE DETECTED 08/13/2014 0149   LABBENZ NEG 02/18/2014 1234   AMPHETMU NONE DETECTED 08/13/2014 0149   AMPHETMU NEG 02/18/2014 1234   THCU NONE DETECTED 08/13/2014 0149   LABBARB POSITIVE* 08/13/2014 0149    Alcohol Level: No results for input(s): ETH in the last 168 hours.  Imaging: No results found.   Assessment/Plan: 29 year old female with recurrent nonepileptic events.  Multiple were viewed this evening.  They include left or right sided head deviation with head moving back and forth and loud swallowing and breathing.  Patient has extension of both arms and legs.   Arching of back noted.  This lasts a few seconds and patient relaxes then they recur.  After about an hour patient returned to baseline mental status with no evidence of a post-ictal state.  Episodes likely brought on by recent increase in stressors.   Patient has been worked up with imaging (MRI) in the past that was unremarkable.    Recommendations: 1.  Psychiatry to evaluate for need of medication adjustment 2.  D/C Keppra.  Would not initiate anticonvulsant therapy at this time.   3.  Would give no further Ativan 4.  Patient unable to drive, operate heavy machinery, perform activities at heights and participate in water activities until release by outpatient physician.  Patient made aware.     Alexis Goodell, MD [facility] [phone] 02/22/2015, 9:15 PM

## 2015-02-23 ENCOUNTER — Encounter (HOSPITAL_COMMUNITY): Payer: Self-pay | Admitting: *Deleted

## 2015-02-23 LAB — CBC
HEMATOCRIT: 24.2 % — AB (ref 36.0–46.0)
HEMOGLOBIN: 7.3 g/dL — AB (ref 12.0–15.0)
MCH: 23.3 pg — AB (ref 26.0–34.0)
MCHC: 30.2 g/dL (ref 30.0–36.0)
MCV: 77.3 fL — ABNORMAL LOW (ref 78.0–100.0)
Platelets: 281 10*3/uL (ref 150–400)
RBC: 3.13 MIL/uL — ABNORMAL LOW (ref 3.87–5.11)
RDW: 17.6 % — ABNORMAL HIGH (ref 11.5–15.5)
WBC: 4.8 10*3/uL (ref 4.0–10.5)

## 2015-02-23 LAB — BASIC METABOLIC PANEL
Anion gap: 4 — ABNORMAL LOW (ref 5–15)
BUN: 12 mg/dL (ref 6–23)
CO2: 24 mmol/L (ref 19–32)
Calcium: 8.3 mg/dL — ABNORMAL LOW (ref 8.4–10.5)
Chloride: 111 mmol/L (ref 96–112)
Creatinine, Ser: 0.7 mg/dL (ref 0.50–1.10)
GFR calc Af Amer: >90 mL/min (ref >90)
GFR calc non Af Amer: >90 mL/min (ref >90)
Glucose, Bld: 95 mg/dL (ref 70–99)
POTASSIUM: 3.8 mmol/L (ref 3.5–5.1)
Sodium: 139 mmol/L (ref 135–145)

## 2015-02-23 LAB — T4, FREE: Free T4: 0.85 ng/dL (ref 0.80–1.80)

## 2015-02-23 LAB — TSH: TSH: 0.945 u[IU]/mL (ref 0.350–4.500)

## 2015-02-23 LAB — OCCULT BLOOD X 1 CARD TO LAB, STOOL: FECAL OCCULT BLD: NEGATIVE

## 2015-02-23 MED ORDER — SODIUM CHLORIDE 0.9 % IV SOLN
INTRAVENOUS | Status: DC
  Administered 2015-02-23 (×2): via INTRAVENOUS

## 2015-02-23 MED ORDER — DULOXETINE HCL 30 MG PO CPEP: 30.0000 mg | ORAL_CAPSULE | Freq: Two times a day (BID) | ORAL | Status: DC

## 2015-02-23 MED ORDER — ALUM & MAG HYDROXIDE-SIMETH 200-200-20 MG/5ML PO SUSP: 30.0000 mL | Freq: Four times a day (QID) | ORAL | Status: DC | PRN

## 2015-02-23 MED ORDER — CLONAZEPAM 0.5 MG PO TABS
0.5000 mg | ORAL_TABLET | Freq: Three times a day (TID) | ORAL | Status: DC
  Administered 2015-02-23 – 2015-02-24 (×6): 0.5 mg via ORAL
  Filled 2015-02-23 (×6): qty 1

## 2015-02-23 MED ORDER — CEFTRIAXONE SODIUM IN DEXTROSE 20 MG/ML IV SOLN
1.0000 g | INTRAVENOUS | Status: DC
  Administered 2015-02-23 – 2015-02-24 (×2): 1 g via INTRAVENOUS
  Filled 2015-02-23 (×2): qty 50

## 2015-02-23 MED ORDER — TOPIRAMATE 100 MG PO TABS
100.0000 mg | ORAL_TABLET | Freq: Every day | ORAL | Status: DC
  Administered 2015-02-23 (×2): 100 mg via ORAL
  Filled 2015-02-23 (×3): qty 1

## 2015-02-23 MED ORDER — FERROUS SULFATE 325 (65 FE) MG PO TABS
325.0000 mg | ORAL_TABLET | Freq: Two times a day (BID) | ORAL | Status: DC
  Administered 2015-02-23 – 2015-02-24 (×3): 325 mg via ORAL
  Filled 2015-02-23 (×5): qty 1

## 2015-02-23 MED ORDER — PROMETHAZINE HCL 25 MG PO TABS
12.5000 mg | ORAL_TABLET | Freq: Four times a day (QID) | ORAL | Status: DC | PRN
  Filled 2015-02-23: qty 1

## 2015-02-23 MED ORDER — BUSPIRONE HCL 15 MG PO TABS
7.5000 mg | ORAL_TABLET | Freq: Two times a day (BID) | ORAL | Status: DC
  Administered 2015-02-23 – 2015-02-24 (×2): 7.5 mg via ORAL
  Filled 2015-02-23 (×3): qty 1

## 2015-02-23 MED ORDER — ACETAMINOPHEN 325 MG PO TABS
650.0000 mg | ORAL_TABLET | Freq: Four times a day (QID) | ORAL | Status: DC | PRN
  Filled 2015-02-23 (×2): qty 2

## 2015-02-23 MED ORDER — OXYCODONE HCL 5 MG PO TABS
5.0000 mg | ORAL_TABLET | ORAL | Status: DC | PRN
  Administered 2015-02-23 – 2015-02-24 (×5): 5 mg via ORAL
  Filled 2015-02-23 (×5): qty 1

## 2015-02-23 MED ORDER — SODIUM CHLORIDE 0.9 % IJ SOLN
3.0000 mL | Freq: Two times a day (BID) | INTRAMUSCULAR | Status: DC
  Administered 2015-02-23 – 2015-02-24 (×3): 3 mL via INTRAVENOUS

## 2015-02-23 MED ORDER — DULOXETINE HCL 30 MG PO CPEP
30.0000 mg | ORAL_CAPSULE | Freq: Every day | ORAL | Status: DC
  Administered 2015-02-23 – 2015-02-24 (×2): 30 mg via ORAL
  Filled 2015-02-23 (×2): qty 1

## 2015-02-23 MED ORDER — PRENATAL MULTIVITAMIN CH
1.0000 | ORAL_TABLET | Freq: Every day | ORAL | Status: DC
  Administered 2015-02-23 – 2015-02-24 (×2): 1 via ORAL
  Filled 2015-02-23 (×2): qty 1

## 2015-02-23 MED ORDER — BUTALBITAL-APAP-CAFFEINE 50-325-40 MG PO TABS
1.0000 | ORAL_TABLET | ORAL | Status: DC | PRN
  Administered 2015-02-23 – 2015-02-24 (×3): 1 via ORAL
  Filled 2015-02-23 (×3): qty 1

## 2015-02-23 MED ORDER — ACETAMINOPHEN 650 MG RE SUPP: 650.0000 mg | Freq: Four times a day (QID) | RECTAL | Status: DC | PRN

## 2015-02-23 MED ORDER — HYDROMORPHONE HCL 1 MG/ML IJ SOLN: 0.5000 mg | INTRAMUSCULAR | Status: DC | PRN

## 2015-02-23 MED ORDER — DULOXETINE HCL 60 MG PO CPEP
60.0000 mg | ORAL_CAPSULE | Freq: Every day | ORAL | Status: DC
  Administered 2015-02-23 (×2): 60 mg via ORAL
  Filled 2015-02-23 (×3): qty 1

## 2015-02-23 NOTE — Progress Notes (Addendum)
Patient had seizure like activity and RN called to bedside. Patient in bed, ridged posture not responding to voice. Family at bedside stating this is how she has been typically presenting. 1mg  PRN ativan given. Vital signs stable. Patient responded to treatment and now alert. Patient stated her stomach was hurting prior to episode occuring. Dr. Waldron Labs notified and made aware.   7:18 PM Patient had another similar episode during bedside reporting with oncoming RN. Sp02 100% on room air, HR 120, BP 111/69. All 4 side rails put up, pillow protecting head for safety.

## 2015-02-23 NOTE — Progress Notes (Signed)
UR completed 

## 2015-02-23 NOTE — Progress Notes (Signed)
NURSING PROGRESS NOTE  DESANI SPRUNG [id number] Admission Data: 02/23/2015 4:13 AM Attending Provider: Albertine Patricia, MD EQA:[id number],QIWLN, MD Code Status: Full   BERNITA BECKSTROM is a 29 y.o. female patient admitted from ED:  -No acute distress noted.  -No complaints of shortness of breath.  -No complaints of chest pain.   Cardiac Monitoring: Box # 13 in place. Cardiac monitor yields:normal sinus rhythm.  Blood pressure 130/85, pulse 97, temperature 98.3 F (36.8 C), temperature source Oral, resp. rate 16, last menstrual period 02/16/2015, SpO2 100 %, not currently breastfeeding.   IV Fluids:  IV in place, occlusive dsg intact without redness, IV cath antecubital left, condition patent and no redness normal saline.   Allergies:  Gabapentin; Metoclopramide hcl; Codeine; Zofran; Latex; and Tape  Past Medical History:   has a past medical history of Migraines; Interstitial cystitis; IUD (FEB 2010); Gastritis (JULY 2011); Depression; Anemia (2011); Fatty liver; Obesity (BMI 30-39.9) (2011 228 LBS BMI 36.8); Iron deficiency anemia (07/23/2010); Irritable bowel syndrome (2012 DIARRHEA); Elevated liver enzymes (JUL 2011ALK PHOS 111-127 AST  143-267 ALT  213-321T BILI 0.6  ALB  3.7-4.1 ); Chronic daily headache; Ovarian cyst; PONV (postoperative nausea and vomiting); ADD (attention deficit disorder); RLQ abdominal pain (07/18/2013); Menorrhagia (07/18/2013); Anxiety; Potassium (K) deficiency; Dysmenorrhea (09/18/2013); Stress (09/18/2013); Patient desires pregnancy (09/18/2013); Pregnant (12/25/2013); Blood transfusion without reported diagnosis; Heart murmur; Gastric bypass status for obesity; Seizures (07/31/2014); Dysrhythmia; Sciatica of left side (11/14/2014); Fibromyalgia; and Hereditary and idiopathic peripheral neuropathy (01/15/2015).  Past Surgical History:   has past surgical history that includes GAB (2007); Cholecystectomy (2005); cath self every nite; Colonoscopy (JUN 2012 ABD  PN/DIARRHEA WITH PROPOFOL); Upper gastrointestinal endoscopy (JULY 2011 NAUSEA-D125,V6, PH 25); Dilation and curettage of uterus; Tonsillectomy; Gastric bypass (06/2006); Savory dilation (06/20/2012); Tonsillectomy and adenoidectomy; Esophagogastroduodenoscopy; Wisdom tooth extraction; Repair vaginal cuff (N/A, 07/30/2014); and Hysteroscopy w/D&C (N/A, 09/12/2014).  Social History:   reports that she has been smoking Cigarettes.  She has a 3 pack-year smoking history. She has never used smokeless tobacco. She reports that she does not drink alcohol or use illicit drugs.  Skin: intact  Patient/Family orientated to room. Information packet given to patient/family. Admission inpatient armband information verified with patient/family to include name and date of birth and placed on patient arm. Side rails up x 2, fall assessment and education completed with patient/family. Patient/family able to verbalize understanding of risk associated with falls and verbalized understanding to call for assistance before getting out of bed. Call light within reach. Patient/family able to voice and demonstrate understanding of unit orientation instructions.

## 2015-02-23 NOTE — Consult Note (Signed)
[facility] Face-to-Face Psychiatry Consult   Reason for Consult:  Depression Referring Physician:  Dr. Waldron Labs Patient Identification: Penny [name] MRN:  [id number] Principal Diagnosis: Seizure-like activity Diagnosis:   Patient Active Problem List   Diagnosis Date Noted  . 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 Seizure-like activity [R56.9] 02/22/2015  . Fibromyalgia [M79.7] 02/18/2015  . Vulvar fissure [N90.89] 02/18/2015  . Current smoker [Z72.0] 01/29/2015  . Hereditary and idiopathic peripheral neuropathy [G60.9] 01/15/2015  . Leg weakness, bilateral [R29.898] 01/02/2015  . Gait disorder [R26.9] 01/02/2015  . Cervicalgia [M54.2] 12/18/2014  . Sciatica of left side [M54.32] 11/14/2014  . 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 Abdominal pain, right lower quadrant [R10.31] 08/22/2014  . Headache, migraine [G43.909] 08/19/2014  . Seizure [R56.9] 08/18/2014  . Encephalopathy [G93.40] 08/13/2014  . Headache(784.0) [R51] 08/13/2014  . Altered mental status [R41.82] 08/13/2014  . Right leg numbness [R20.8] 07/31/2014  . Previous gastric bypass affecting pregnancy, antepartum [O99.840] 05/22/2014  . 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 Depression complicating pregnancy in second trimester, antepartum [K59.935, F32.9] 05/09/2014  . Rapid palpitations [R00.2] 02/18/2014  . H/O maternal third degree perineal laceration, currently pregnant [O09.299] 02/18/2014  . Maternal iron deficiency anemia complicating pregnancy [T01.779, D50.9] 01/16/2014  . Restless legs [G25.81] 01/16/2014  . Chronic interstitial cystitis [N30.10] 10/23/2013  . Menorrhagia [N92.0] 07/18/2013  . h/o Opiate addiction [F11.20] 03/11/2012    Class: Acute  . Passive suicidal ideations [R45.851] 03/10/2012    Class: Acute  . History of migraine headaches [Z86.69] 03/10/2012    Class: Acute  . Depression with anxiety [F41.8] 03/10/2012    Class: Chronic  . 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 Pelvic congestion syndrome [N94.89] 10/13/2011  . Coitalgia [N94.1] 10/13/2011  . Chronic migraine without aura [G43.709] 10/13/2011  . IBS (irritable bowel syndrome) [K58.9] 08/25/2011  . Diarrhea [R19.7] 05/27/2011  . OBESITY, UNSPECIFIED [E66.9] 09/17/2010  . LIVER FUNCTION TESTS, ABNORMAL, HX OF [Z86.39] 09/17/2010  . Iron deficiency anemia [D50.9] 07/23/2010    Total Time spent with patient: 1 hour  Subjective:   Penny [name] is a 29 y.o. female patient admitted with non-epileptic seizures. Pt interviewed. Chart reviewed. Pt has a h/o postpartum depression. Pt reports experiencing seizures since before her daughter was born in Aug 2015. Pt reports having a complicated pregnancy and delivery, and was on bedrest for a while. Pt reports having memory problems. Pt reports having a murmur during the pregnancy, so was placed on a heart monitor. Pt delivered 5 weeks early, so had a premature baby that had to stay in the NICU. Pt was placed in the ICU after the delivery, and required blood transfusions in the OR. Pt also reports falling at her bedside at that time.  Pt reports her mood is still depressed. Pt reports restless sleep, approximately 7 hours. She reports decreased appetite/energy/concentration. Pt gained 70 lbs, and has fibromyalgia and neuropathy. Pt denies SI/HI/AVH. Pt reports being easily frustrated. Husband walked out on her last week, because he could not handle all the meds and depression. No history of mania/psychosis/abuse. Pt reports having anxiety and panic attacks, which triggers the seizure-like episodes.   Past Psychiatric History: Pt reports talking to her preacher and his wife periodically for support. Pt reports not wanting to see a therapist, because her last therapist "liked to talk  to her  coffee mug". Pt called her psychiatrist's office last week (Dr. Zebedee Iba, at [facility] and [facility] in [place]) regarding the crisis of her husband leaving her. She reports her psychiatrist would not change her meds. Pt reports being on adderall before her pregnancy, but Dr. Zebedee Iba refused to restart it. No history of suicide attempts. 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 Pt's cymbalta dose was recently increased (1 week ago) from 30 mg BID to 30 mg QAM and 60 mg QHS, to help with neuropathy and fibromyalgia. Pt is prescribed klonopin 0.5 mg BID for anxiety by her psychiatrist, but she reports taking more than that for her anxiety. Pt is taking topamax 100 mg qhs for the past month for migraines. Pt reports taking paxil in the past, which caused her to have suicidal thoughts.   Social History: Pt has 2 children. 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   Collateral info obtained from mother and sister, who came to visit pt: They report pt has had a tough year. Pt has been diagnosed with pseudoseizures, and pt's memory has been affected. Pt's last pregnancy was traumatic for her. Father has not been involved. Husband left her last week, and pt feels guilty about that. They do not feel that pt is a threat to herself or others at this time. They want to make sure she gets the help she needs for her depression, pseudoseizures, and anxiety.  HPI:  See above HPI Elements:   Location:  depression. Quality:  moderate. Severity:  moderate. Timing:  8 months, worsening last week. Duration:  worsening last week. Context:  husband left.  Past Medical History:  Past Medical History  Diagnosis Date  . Migraines   . Interstitial cystitis   . IUD FEB 2010  . Gastritis JULY 2011  . Depression   . 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 Fatty liver   . Obesity (BMI 30-39.9) 2011 228 LBS BMI 36.8  . Iron  deficiency anemia 07/23/2010  . Irritable bowel syndrome 2012 DIARRHEA    JUN 2012 TTG IgA 14.9  . Elevated liver enzymes JUL 2011ALK PHOS 111-127 AST  143-267 ALT  213-321T BILI 0.6  ALB  3.7-4.06 Jun 2011 ALK PHOS 118 AST 24 ALT 42 T BILI 0.4 ALB 3.9  . Chronic daily headache   . Ovarian cyst   . PONV (postoperative nausea and vomiting)   . ADD (attention deficit disorder)   . RLQ abdominal pain 07/18/2013  . Menorrhagia 07/18/2013  . Anxiety   . Potassium (K) deficiency   . Dysmenorrhea 09/18/2013  . Stress 09/18/2013  . Patient desires pregnancy 09/18/2013  . Pregnant 12/25/2013  . Blood transfusion without reported diagnosis   . Heart murmur   . Gastric bypass status for obesity   . 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 Dysrhythmia   . Sciatica of left side 11/14/2014  . Fibromyalgia   . 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    Past Surgical History  Procedure Laterality Date  . Gab  2007    in [place]-POUCH 5 CM  . Cholecystectomy  2005    biliary dyskinesia  . Cath self every nite      for sodium bicarb injection (discontinued 2013)  . Colonoscopy  JUN 2012 ABD PN/DIARRHEA WITH PROPOFOL  NL COLON  . Upper gastrointestinal endoscopy  JULY 2011 NAUSEA-D125,V6, PH 25    Bx; GASTRITIS, POUCH-5 CM LONG  . Dilation and curettage of uterus    . Tonsillectomy    . Gastric bypass  06/2006  . Savory dilation  06/20/2012    NKN:LZJQ gastritis/Ulcer in the mid jejunum  . Tonsillectomy and adenoidectomy    . Esophagogastroduodenoscopy    . Wisdom tooth extraction    . Repair vaginal cuff N/A 07/30/2014    Procedure: REPAIR VAGINAL CUFF;  Surgeon: Mora Bellman, MD;  Location: [facility];  Service: Gynecology;  Laterality: N/A;  . Hysteroscopy w/d&c N/A 09/12/2014    Procedure: DILATATION AND CURETTAGE /HYSTEROSCOPY;  Surgeon: Jonnie Kind, MD;  Location: [facility];  Service: Gynecology;  Laterality: N/A;   Family History:  Family History  Problem  Relation Age of Onset  . Hemochromatosis Maternal Grandmother   . Migraines Maternal Grandmother   . Cancer Maternal Grandmother   . Breast cancer Maternal Grandmother   . Hypertension Father   . Diabetes Father   . Coronary artery disease Father   . Migraines Paternal Grandmother   . Breast cancer Paternal Grandmother   . Cancer Mother     breast  . Hemochromatosis Mother   . Breast cancer Mother   . Coronary artery disease Paternal Grandfather    Social History:  History  Alcohol Use No     History  Drug Use No    History   Social History  . Marital Status: Married    Spouse Name: N/A  . Number of Children: 2  . Years of Education: some colle   Occupational History  .     Social History Main Topics  . Smoking status: Current Every Day Smoker -- 0.50 packs/day for 6 years    Types: Cigarettes  . Smokeless tobacco: Never Used  . Alcohol Use: No  . Drug Use: No  . Sexual Activity: Not Currently    Birth Control/ Protection: None   Other Topics Concern  . None   Social History Narrative   Patient is right handed.   Patient drinks one cup of coffee daily.   Additional Social History:                          Allergies:   Allergies  Allergen Reactions  . Gabapentin Other (See Comments)    Pt states that she was unresponsive after taking this medication, but her vitals remained stable.    . Metoclopramide Hcl Anxiety and Other (See Comments)    Pt states that she felt like she was trapped in a box, and could not get out.  Pt also states that she had temporary loss of movement, weakness, and tingling.    . Codeine Nausea And Vomiting  . Zofran [Ondansetron] Other (See Comments)    Migraines   . Latex Rash  . Tape Itching and Rash    Please use paper tape    Labs:  Results for orders placed or performed during the hospital encounter of 02/22/15 (from the past 48 hour(s))  CBC WITH DIFFERENTIAL     Status: Abnormal   Collection Time: 02/22/15   8:50 PM  Result Value Ref Range   WBC 7.4 4.0 - 10.5 K/uL   RBC 4.00 3.87 - 5.11 MIL/uL   Hemoglobin 9.4 (L) 12.0 - 15.0 g/dL   HCT 30.4 (L) 36.0 - 46.0 %   MCV 76.0 (L)  78.0 - 100.0 fL   MCH 23.5 (L) 26.0 - 34.0 pg   MCHC 30.9 30.0 - 36.0 g/dL   RDW 54.6 (H) 56.8 - 12.7 %   Platelets 414 (H) 150 - 400 K/uL   Neutrophils Relative % 55 43 - 77 %   Neutro Abs 4.0 1.7 - 7.7 K/uL   Lymphocytes Relative 35 12 - 46 %   Lymphs Abs 2.6 0.7 - 4.0 K/uL   Monocytes Relative 9 3 - 12 %   Monocytes Absolute 0.7 0.1 - 1.0 K/uL   Eosinophils Relative 1 0 - 5 %   Eosinophils Absolute 0.1 0.0 - 0.7 K/uL   Basophils Relative 0 0 - 1 %   Basophils Absolute 0.0 0.0 - 0.1 K/uL  Comprehensive metabolic panel     Status: Abnormal   Collection Time: 02/22/15  8:50 PM  Result Value Ref Range   Sodium 135 135 - 145 mmol/L   Potassium 4.3 3.5 - 5.1 mmol/L   Chloride 105 96 - 112 mmol/L   CO2 20 19 - 32 mmol/L   Glucose, Bld 95 70 - 99 mg/dL   BUN 16 6 - 23 mg/dL   Creatinine, Ser 5.17 0.50 - 1.10 mg/dL   Calcium 9.2 8.4 - 00.1 mg/dL   Total Protein 7.8 6.0 - 8.3 g/dL   Albumin 4.3 3.5 - 5.2 g/dL   AST 23 0 - 37 U/L   ALT 14 0 - 35 U/L   Alkaline Phosphatase 104 39 - 117 U/L   Total Bilirubin 0.7 0.3 - 1.2 mg/dL   GFR calc non Af Amer 88 (L) >90 mL/min   GFR calc Af Amer >90 >90 mL/min    Comment: (NOTE) The eGFR has been calculated using the CKD EPI equation. This calculation has not been validated in all clinical situations. eGFR's persistently <90 mL/min signify possible Chronic Kidney Disease.    Anion gap 10 5 - 15  Ethanol/ETOH     Status: Abnormal   Collection Time: 02/22/15  8:50 PM  Result Value Ref Range   Alcohol, Ethyl (B) 20 (H) 0 - 9 mg/dL    Comment:        LOWEST DETECTABLE LIMIT FOR SERUM ALCOHOL IS 11 mg/dL FOR MEDICAL PURPOSES ONLY   I-Stat Beta hCG blood, ED ([facility], [facility], [facility] only)     Status: None   Collection Time: 02/22/15  8:52 PM  Result Value Ref Range   I-stat hCG,  quantitative <5.0 <5 mIU/mL   Comment 3            Comment:   GEST. AGE      CONC.  (mIU/mL)   <=1 WEEK        5 - 50     2 WEEKS       50 - 500     3 WEEKS       100 - 10,000     4 WEEKS     1,000 - 30,000        FEMALE AND NON-PREGNANT FEMALE:     LESS THAN 5 mIU/mL   Drug screen panel, emergency     Status: Abnormal   Collection Time: 02/22/15 10:12 PM  Result Value Ref Range   Opiates NONE DETECTED NONE DETECTED   Cocaine NONE DETECTED NONE DETECTED   Benzodiazepines POSITIVE (A) NONE DETECTED   Amphetamines NONE DETECTED NONE DETECTED   Tetrahydrocannabinol NONE DETECTED NONE DETECTED   Barbiturates NONE DETECTED NONE DETECTED  Comment:        DRUG SCREEN FOR MEDICAL PURPOSES ONLY.  IF CONFIRMATION IS NEEDED FOR ANY PURPOSE, NOTIFY LAB WITHIN 5 DAYS.        LOWEST DETECTABLE LIMITS FOR URINE DRUG SCREEN Drug Class       Cutoff (ng/mL) Amphetamine      1000 Barbiturate      200 Benzodiazepine   932 Tricyclics       671 Opiates          300 Cocaine          300 THC              50   Urinalysis, Routine w reflex microscopic     Status: Abnormal   Collection Time: 02/22/15 10:12 PM  Result Value Ref Range   Color, Urine YELLOW YELLOW   APPearance CLOUDY (A) CLEAR   Specific Gravity, Urine 1.009 1.005 - 1.030   pH 6.0 5.0 - 8.0   Glucose, UA NEGATIVE NEGATIVE mg/dL   Hgb urine dipstick TRACE (A) NEGATIVE   Bilirubin Urine NEGATIVE NEGATIVE   Ketones, ur NEGATIVE NEGATIVE mg/dL   Protein, ur NEGATIVE NEGATIVE mg/dL   Urobilinogen, UA 0.2 0.0 - 1.0 mg/dL   Nitrite NEGATIVE NEGATIVE   Leukocytes, UA MODERATE (A) NEGATIVE  Urine pregnancy     Status: None   Collection Time: 02/22/15 10:12 PM  Result Value Ref Range   Preg Test, Ur NEGATIVE NEGATIVE    Comment:        THE SENSITIVITY OF THIS METHODOLOGY IS >20 mIU/mL.   Urine microscopic-add on     Status: Abnormal   Collection Time: 02/22/15 10:12 PM  Result Value Ref Range   Squamous Epithelial / LPF FEW  (A) RARE   WBC, UA 7-10 <3 WBC/hpf   RBC / HPF 0-2 <3 RBC/hpf   Bacteria, UA FEW (A) RARE  TSH     Status: None   Collection Time: 02/23/15 12:17 AM  Result Value Ref Range   TSH 0.945 0.350 - 4.500 uIU/mL  Basic metabolic panel     Status: Abnormal   Collection Time: 02/23/15  7:10 AM  Result Value Ref Range   Sodium 139 135 - 145 mmol/L   Potassium 3.8 3.5 - 5.1 mmol/L   Chloride 111 96 - 112 mmol/L   CO2 24 19 - 32 mmol/L   Glucose, Bld 95 70 - 99 mg/dL   BUN 12 6 - 23 mg/dL   Creatinine, Ser 0.70 0.50 - 1.10 mg/dL   Calcium 8.3 (L) 8.4 - 10.5 mg/dL   GFR calc non Af Amer >90 >90 mL/min   GFR calc Af Amer >90 >90 mL/min    Comment: (NOTE) The eGFR has been calculated using the CKD EPI equation. This calculation has not been validated in all clinical situations. eGFR's persistently <90 mL/min signify possible Chronic Kidney Disease.    Anion gap 4 (L) 5 - 15  CBC     Status: Abnormal   Collection Time: 02/23/15  7:10 AM  Result Value Ref Range   WBC 4.8 4.0 - 10.5 K/uL   RBC 3.13 (L) 3.87 - 5.11 MIL/uL   Hemoglobin 7.3 (L) 12.0 - 15.0 g/dL    Comment: DELTA CHECK NOTED REPEATED TO VERIFY    HCT 24.2 (L) 36.0 - 46.0 %   MCV 77.3 (L) 78.0 - 100.0 fL   MCH 23.3 (L) 26.0 - 34.0 pg   MCHC 30.2 30.0 - 36.0 g/dL  RDW 17.6 (H) 11.5 - 15.5 %   Platelets 281 150 - 400 K/uL    Comment: SPECIMEN CHECKED FOR CLOTS REPEATED TO VERIFY     Vitals: Blood pressure 107/64, pulse 76, temperature 97.3 F (36.3 C), temperature source Oral, resp. rate 18, height $RemoveBe'5\' 6"'[id number]$  (1.676 m), weight 110.1 kg (242 lb 11.6 oz), last menstrual period 02/16/2015, SpO2 98 %, not currently breastfeeding.  Risk to Self: Is patient at risk for suicide?: No Risk to Others:   Prior Inpatient Therapy:   Prior Outpatient Therapy:    Current Facility-Administered Medications  Medication Dose Route Frequency Provider Last Rate Last Dose  . acetaminophen (TYLENOL) tablet 650 mg  650 mg Oral Q6H PRN Theressa Millard, MD       Or  . acetaminophen (TYLENOL) suppository 650 mg  650 mg Rectal Q6H PRN Theressa Millard, MD      . alum & mag hydroxide-simeth (MAALOX/MYLANTA) 200-200-20 MG/5ML suspension 30 mL  30 mL Oral Q6H PRN Theressa Millard, MD      . butalbital-acetaminophen-caffeine (FIORICET, ESGIC) 50-325-40 MG per tablet 1 tablet  1 tablet Oral Q4H PRN Albertine Patricia, MD   1 tablet at 02/23/15 1134  . cefTRIAXone (ROCEPHIN) 1 g in dextrose 5 % 50 mL IVPB - Premix  1 g Intravenous Q24H Albertine Patricia, MD   1 g at 02/23/15 1557  . clonazePAM (KLONOPIN) tablet 0.5 mg  0.5 mg Oral TID Theressa Millard, MD   0.5 mg at 02/23/15 1546  . DULoxetine (CYMBALTA) DR capsule 30 mg  30 mg Oral Daily Theressa Millard, MD   30 mg at 02/23/15 0948   And  . DULoxetine (CYMBALTA) DR capsule 60 mg  60 mg Oral QHS Theressa Millard, MD   60 mg at 02/23/15 0132  . ferrous sulfate tablet 325 mg  325 mg Oral BID WC Theressa Millard, MD   325 mg at 02/23/15 0834  . LORazepam (ATIVAN) injection 1 mg  1 mg Intravenous Q4H PRN Theressa Millard, MD      . oxyCODONE (Oxy IR/ROXICODONE) immediate release tablet 5 mg  5 mg Oral Q4H PRN Theressa Millard, MD   5 mg at 02/23/15 0948  . prenatal multivitamin tablet 1 tablet  1 tablet Oral Q1200 Theressa Millard, MD   1 tablet at 02/23/15 1102  . promethazine (PHENERGAN) tablet 12.5 mg  12.5 mg Oral Q6H PRN Silver Huguenin Elgergawy, MD      . sodium chloride 0.9 % injection 3 mL  3 mL Intravenous Q12H Theressa Millard, MD   3 mL at 02/23/15 0045  . topiramate (TOPAMAX) tablet 100 mg  100 mg Oral QHS Theressa Millard, MD   100 mg at 02/23/15 0132    Musculoskeletal: Strength & Muscle Tone: within normal limits Gait & Station: normal Patient leans: N/A  Psychiatric Specialty Exam: Physical Exam  ROS  Blood pressure 107/64, pulse 76, temperature 97.3 F (36.3 C), temperature source Oral, resp. rate 18, height $RemoveBe'5\' 6"'fcGoHzhQH$  (1.676 m), weight 110.1 kg (242 lb 11.6  oz), last menstrual period 02/16/2015, SpO2 98 %, not currently breastfeeding.Body mass index is 39.2 kg/(m^2).  General Appearance: Fairly Groomed  Engineer, water::  Minimal  Speech:  Normal Rate  Volume:  Normal  Mood:  Depressed and Irritable  Affect:  Congruent, Depressed and Tearful  Thought Process:  Coherent, Goal Directed and Logical  Orientation:  Full (Time, Place, and Person)  Thought  Content:  Negative  Suicidal Thoughts:  No  Homicidal Thoughts:  No  Memory:  Immediate;   Fair Recent;   Fair Remote;   Fair  Judgement:  Intact  Insight:  Fair  Psychomotor Activity:  Normal  Concentration:  Fair  Recall:  [employer]  Language: Fair  Akathisia:  Negative  Handed:  Right  AIMS (if indicated):     Assets:  Communication Skills Desire for Improvement Housing Social Support Talents/Skills Transportation Vocational/Educational  ADL's:  Intact  Cognition: WNL  Sleep:      Medical Decision Making: New problem, with additional work up planned, Review of Psycho-Social Stressors (1), Discuss test with performing physician (1), Review of Medication Regimen & Side Effects (2) and Review of New Medication or Change in Dosage (2)  Treatment Plan Summary: Daily contact with patient to assess and evaluate symptoms and progress in treatment, Medication management and Plan : pt may continue cymbalta 30 mg qam and 60 mg qhs, since dose was just increased 1 week ago by her neurologist. Pt may also continue the klonopin 0.5 mg bid-tid prn anxiety. Will add buspar 7.5 mg bid to augment for depression/anxiety symptoms. Discussed r/b/se/alt of buspar with pt, who gave verbal informed consent for the medication. Pt agrees to f/u with her outpatient psychiatrist and therapist, and I emphasized the importance of therapy in her treatment.  Plan:  No evidence of imminent risk to self or others at present.   Patient does not meet criteria for psychiatric inpatient  admission. Supportive therapy provided about ongoing stressors. Discussed crisis plan, support from social network, calling 911, coming to the Emergency Department, and calling Suicide Hotline. Disposition: Pt may be discharged to home, once medically cleared. Pt needs to follow up with her outpatient psychiatrist and therapist.  Dereck Leep 02/23/2015 5:40 PM

## 2015-02-23 NOTE — Progress Notes (Signed)
Patient Demographics  Penny [name], is a 29 y.o. female, DOB - 31-Mar-1986, UJW:[id number]  Admit date - 02/22/2015   Admitting Physician Ron Parker, MD  Outpatient Primary MD for the patient is LUKING,SCOTT, MD  LOS -    Chief Complaint  Patient presents with  . Seizures      Admission history of present illness/brief narrative: Penny [name]  is a 29 y.o. female with history of Pseudoseizures (  had extensive evaluation including MRI brain/MRA of the brain/EEG/long-term monitoring. It was felt that she likely has pseudoseizures) and history of Post Partum Depression, Migraines, and Anxiety was Found outside of the [employer] with Seizure -like activity described as tonic Clonic and posturing without loss of consciousness lasting 10 -125 minutes and then recurred. She was medicated with IV Ativan and loaded with IV Keppra, and Neurology was consulted and saw her in the ED , patient reports stressors in life including her husband leaving her last week, at this point no further workup recommended by ED, no new antiepileptic medication, or diazepam, they recommend psychiatric evaluation. - Patient workup was significant for anemia, and UTI.   Subjective:   Hoy Morn today has, No headache, No chest pain, No abdominal pain - No Nausea, No new weakness tingling or numbness, No Cough - SOB.   Assessment & Plan    Principal Problem:   Seizure-like activity Active Problems:   Iron deficiency anemia   Depression with anxiety   Pseudoseizure  Seizure-like activity: - Neuro consult appreciated, DC Keppra, no initiation of anticonvulsant therapy, no further Ativan . - Extensive workup was done as an outpatient, it was felt to be secondary to pseudoseizures. - Psychiatric consulted as per neuro recommendation .  UTI - Continue with  Rocephin - Follow on urine cultures  Anemia - Most likely related to iron deficiency, given her recent deficiency, and menstrual periods. Tinea with iron supplements - Significant drop in her hemoglobin from 9.4 admission to 7.3 related to delusional changes, even platelet and white blood cell significantly dropped. - We'll check Hemoccult stool.  Depression and anxiety - And tinea with Cymbalta and Klonopin  Migraine headaches - Continue with Topamax   Code Status: Full  Family Communication: Mother at bedside  Disposition Plan: Home once stable   Procedures None   Consults   Psychiatry Neurology   Medications  Scheduled Meds: . cefTRIAXone (ROCEPHIN)  IV  1 g Intravenous Q24H  . clonazePAM  0.5 mg Oral TID  . DULoxetine  30 mg Oral Daily   And  . DULoxetine  60 mg Oral QHS  . ferrous sulfate  325 mg Oral BID WC  . prenatal multivitamin  1 tablet Oral Q1200  . sodium chloride  3 mL Intravenous Q12H  . topiramate  100 mg Oral QHS   Continuous Infusions:  PRN Meds:.acetaminophen **OR** acetaminophen, alum & mag hydroxide-simeth, butalbital-acetaminophen-caffeine, LORazepam, oxyCODONE, promethazine  DVT Prophylaxis SCDs   Lab Results  Component Value Date   PLT 281 02/23/2015    Antibiotics   Anti-infectives    Start     Dose/Rate Route Frequency Ordered Stop   02/23/15 1600  cefTRIAXone (ROCEPHIN) 1 g in dextrose 5 % 50 mL IVPB - Premix  1 g 100 mL/hr over 30 Minutes Intravenous Every 24 hours 02/23/15 1436            Objective:   Filed Vitals:   02/23/15 0522 02/23/15 0610 02/23/15 1030 02/23/15 1323  BP: 112/74 103/57 123/57 107/64  Pulse: 88 85 92 76  Temp: 97.6 F (36.4 C) 97.5 F (36.4 C) 97.8 F (36.6 C) 97.3 F (36.3 C)  TempSrc: Oral Oral Oral Oral  Resp: 20 20 18 18   Height:      Weight:      SpO2: 100% 99% 98% 98%    Wt Readings from Last 3 Encounters:  02/23/15 110.1 kg (242 lb 11.6 oz)  02/18/15 114.76 kg (253 lb)    02/18/15 114.851 kg (253 lb 3.2 oz)     Intake/Output Summary (Last 24 hours) at 02/23/15 1621 Last data filed at 02/23/15 1511  Gross per 24 hour  Intake 1603.34 ml  Output   1100 ml  Net 503.34 ml     Physical Exam  Awake Alert, Oriented X 3, No new F.N deficits, Normal affect [place].AT,PERRAL Supple Neck,No JVD, No cervical lymphadenopathy appriciated.  Symmetrical Chest wall movement, Good air movement bilaterally, CTAB RRR,No Gallops,Rubs or new Murmurs, No Parasternal Heave +ve B.Sounds, Abd Soft, No tenderness, No organomegaly appriciated, No rebound - guarding or rigidity. No Cyanosis, Clubbing or edema, No new Rash or bruise     Data Review   Micro Results Recent Results (from the past 240 hour(s))  Urine culture     Status: None   Collection Time: 02/17/15 12:00 AM  Result Value Ref Range Status   Urine Culture, Routine Final report  Final   Result 1 Comment  Final    Comment: Culture shows less than 10,000 colony forming units of bacteria per milliliter of urine. This colony count is not generally considered to be clinically significant.     Radiology Reports No results found.  CBC  Recent Labs Lab 02/17/15 1337 02/18/15 1152 02/22/15 2050 02/23/15 0710  WBC  --   --  7.4 4.8  HGB 8.7* 8.1* 9.4* 7.3*  HCT  --   --  30.4* 24.2*  PLT  --   --  414* 281  MCV  --   --  76.0* 77.3*  MCH  --   --  23.5* 23.3*  MCHC  --   --  30.9 30.2  RDW  --   --  17.2* 17.6*  LYMPHSABS  --   --  2.6  --   MONOABS  --   --  0.7  --   EOSABS  --   --  0.1  --   BASOSABS  --   --  0.0  --     Chemistries   Recent Labs Lab 02/22/15 2050 02/23/15 0710  NA 135 139  K 4.3 3.8  CL 105 111  CO2 20 24  GLUCOSE 95 95  BUN 16 12  CREATININE 0.89 0.70  CALCIUM 9.2 8.3*  AST 23  --   ALT 14  --   ALKPHOS 104  --   BILITOT 0.7  --    ------------------------------------------------------------------------------------------------------------------ estimated  creatinine clearance is 132.7 mL/min (by C-G formula based on Cr of 0.7). ------------------------------------------------------------------------------------------------------------------ No results for input(s): HGBA1C in the last 72 hours. ------------------------------------------------------------------------------------------------------------------ No results for input(s): CHOL, HDL, LDLCALC, TRIG, CHOLHDL, LDLDIRECT in the last 72 hours. ------------------------------------------------------------------------------------------------------------------  Recent Labs  02/23/15 0017  TSH 0.945   ------------------------------------------------------------------------------------------------------------------ No results for input(s):  VITAMINB12, FOLATE, FERRITIN, TIBC, IRON, RETICCTPCT in the last 72 hours.  Coagulation profile No results for input(s): INR, PROTIME in the last 168 hours.  No results for input(s): DDIMER in the last 72 hours.  Cardiac Enzymes No results for input(s): CKMB, TROPONINI, MYOGLOBIN in the last 168 hours.  Invalid input(s): CK ------------------------------------------------------------------------------------------------------------------ Invalid input(s): POCBNP     Time Spent in minutes   35 minutes   Labrisha Wuellner M.D on 02/23/2015 at 4:21 PM  Between 7am to 7pm - Pager - [phone]  After 7pm go to [email or web address] - password [email or web address]  And look for the night coverage person covering for me after hours  [facility] Office  [phone]   **Disclaimer: This note may have been dictated with voice recognition software. Similar sounding words can inadvertently be transcribed and this note may contain transcription errors which may not have been corrected upon publication of note.**

## 2015-02-24 LAB — CBC
HCT: 26.6 % — ABNORMAL LOW (ref 36.0–46.0)
Hemoglobin: 8 g/dL — ABNORMAL LOW (ref 12.0–15.0)
MCH: 23.3 pg — ABNORMAL LOW (ref 26.0–34.0)
MCHC: 30.1 g/dL (ref 30.0–36.0)
MCV: 77.6 fL — AB (ref 78.0–100.0)
Platelets: 293 10*3/uL (ref 150–400)
RBC: 3.43 MIL/uL — ABNORMAL LOW (ref 3.87–5.11)
RDW: 17.6 % — ABNORMAL HIGH (ref 11.5–15.5)
WBC: 5.2 10*3/uL (ref 4.0–10.5)

## 2015-02-24 LAB — BASIC METABOLIC PANEL
ANION GAP: 6 (ref 5–15)
BUN: 15 mg/dL (ref 6–23)
CALCIUM: 8.7 mg/dL (ref 8.4–10.5)
CO2: 23 mmol/L (ref 19–32)
Chloride: 109 mmol/L (ref 96–112)
Creatinine, Ser: 0.68 mg/dL (ref 0.50–1.10)
GFR calc Af Amer: >90 mL/min (ref >90)
Glucose, Bld: 81 mg/dL (ref 70–99)
Potassium: 3.8 mmol/L (ref 3.5–5.1)
SODIUM: 138 mmol/L (ref 135–145)

## 2015-02-24 MED ORDER — DULOXETINE HCL 30 MG PO CPEP: 30.0000 mg | ORAL_CAPSULE | Freq: Two times a day (BID) | ORAL | Status: DC

## 2015-02-24 MED ORDER — SULFAMETHOXAZOLE-TRIMETHOPRIM 800-160 MG PO TABS: 1.0000 | ORAL_TABLET | Freq: Two times a day (BID) | ORAL | Status: AC

## 2015-02-24 MED ORDER — PROMETHAZINE HCL 12.5 MG PO TABS: 12.5000 mg | ORAL_TABLET | Freq: Four times a day (QID) | ORAL | Status: DC | PRN

## 2015-02-24 MED ORDER — BUSPIRONE HCL 7.5 MG PO TABS: 7.5000 mg | ORAL_TABLET | Freq: Two times a day (BID) | ORAL | Status: DC

## 2015-02-24 MED ORDER — CLONAZEPAM 0.5 MG PO TABS: 0.5000 mg | ORAL_TABLET | Freq: Two times a day (BID) | ORAL | Status: DC | PRN

## 2015-02-24 NOTE — Progress Notes (Signed)
CARE MANAGEMENT NOTE 02/24/2015  Patient:  Penny [name], Penny [name]   Account Number:  [id number]  Date Initiated:  02/24/2015  Documentation initiated by:  [facility]  Subjective/Objective Assessment:   syncope     Action/Plan:   lives at home with children   Anticipated DC Date:  02/24/2015   Anticipated DC Plan:  [place]  CM consult  [facility]  Medication Assistance      Choice offered to / List presented to:             Status of service:  Completed, signed off Medicare Important Message given?  NO (If response is "NO", the following Medicare IM given date fields will be blank) Date Medicare IM given:   Medicare IM given by:   Date Additional Medicare IM given:   Additional Medicare IM given by:    Discharge Disposition:  HOME/SELF CARE  Per UR Regulation:  Reviewed for med. 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 Her insurance is not effective and has not received her first check. MATCH provided for pt and explained she can use once per year. Will have a $3.00 copay for meds and explained she can use the [facility] to fill her medications. Pt was provided a note for work. Her appt with [facility] is 03/04/2015 at 2 pm. Jonnie Finner RN CCM Case Mgmt phone [phone]

## 2015-02-24 NOTE — Consult Note (Signed)
Psychiatry Consult follow-up  Reason for Consult:  Depression and stress induced pseudoseizures Referring Physician:  Dr. Waldron Labs Patient Identification: Penny [name] MRN:  [id number] Principal Diagnosis: Seizure-like activity Diagnosis:   Patient Active Problem List   Diagnosis Date Noted  . 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 Seizure-like activity [R56.9] 02/22/2015  . Fibromyalgia [M79.7] 02/18/2015  . Vulvar fissure [N90.89] 02/18/2015  . Current smoker [Z72.0] 01/29/2015  . Hereditary and idiopathic peripheral neuropathy [G60.9] 01/15/2015  . Leg weakness, bilateral [R29.898] 01/02/2015  . Gait disorder [R26.9] 01/02/2015  . Cervicalgia [M54.2] 12/18/2014  . Sciatica of left side [M54.32] 11/14/2014  . 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 Abdominal pain, right lower quadrant [R10.31] 08/22/2014  . Headache, migraine [G43.909] 08/19/2014  . Seizure [R56.9] 08/18/2014  . Encephalopathy [G93.40] 08/13/2014  . Headache(784.0) [R51] 08/13/2014  . Altered mental status [R41.82] 08/13/2014  . Right leg numbness [R20.8] 07/31/2014  . Previous gastric bypass affecting pregnancy, antepartum [O99.840] 05/22/2014  . 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 Depression complicating pregnancy in second trimester, antepartum [W97.989, F32.9] 05/09/2014  . Rapid palpitations [R00.2] 02/18/2014  . H/O maternal third degree perineal laceration, currently pregnant [O09.299] 02/18/2014  . Maternal iron deficiency anemia complicating pregnancy [Q11.941, D50.9] 01/16/2014  . Restless legs [G25.81] 01/16/2014  . Chronic interstitial cystitis [N30.10] 10/23/2013  . Menorrhagia [N92.0] 07/18/2013  . h/o Opiate addiction [F11.20] 03/11/2012    Class: Acute  . Passive suicidal ideations [R45.851] 03/10/2012    Class: Acute  . History of migraine headaches [Z86.69] 03/10/2012    Class: Acute  . Depression with anxiety [F41.8] 03/10/2012    Class: Chronic  .  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 Pelvic congestion syndrome [N94.89] 10/13/2011  . Coitalgia [N94.1] 10/13/2011  . Chronic migraine without aura [G43.709] 10/13/2011  . IBS (irritable bowel syndrome) [K58.9] 08/25/2011  . Diarrhea [R19.7] 05/27/2011  . OBESITY, UNSPECIFIED [E66.9] 09/17/2010  . LIVER FUNCTION TESTS, ABNORMAL, HX OF [Z86.39] 09/17/2010  . Iron deficiency anemia [D50.9] 07/23/2010    Total Time spent with patient: 30 minutes  Subjective:   Penny [name] is a 29 y.o. female patient admitted with non-epileptic seizures. Pt interviewed. Chart reviewed. Pt has a h/o postpartum depression. Pt reports experiencing seizures since before her daughter was born in Aug 2015. Pt reports having a complicated pregnancy and delivery, and was on bedrest for a while. Pt reports having memory problems. Pt reports having a murmur during the pregnancy, so was placed on a heart monitor. Pt delivered 5 weeks early, so had a premature baby that had to stay in the NICU. Pt was placed in the ICU after the delivery, and required blood transfusions in the OR. Pt also reports falling at her bedside at that time. Pt reports her mood is still depressed. Pt reports restless sleep, approximately 7 hours. She reports decreased appetite/energy/concentration. Pt gained 70 lbs, and has fibromyalgia and neuropathy. Pt denies SI/HI/AVH. Pt reports being easily frustrated. Husband walked out on her last week, because he could not handle all the meds and depression. No history of mania/psychosis/abuse. Pt reports having anxiety and panic attacks, which triggers the seizure-like episodes.   Past Psychiatric History: Pt reports talking to her preacher and his wife periodically for support. Pt reports not wanting to see a therapist, because her last therapist  "liked  to talk to her coffee mug". Pt called her psychiatrist's office last week (Dr. Zebedee Iba, at [facility] in [place]) regarding the crisis of her husband leaving her. She reports her psychiatrist would not change her meds. Pt reports being on adderall before her pregnancy, but Dr. Zebedee Iba refused to restart it. No history of suicide attempts. 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 Pt's cymbalta dose was recently increased (1 week ago) from 30 mg BID to 30 mg QAM and 60 mg QHS, to help with neuropathy and fibromyalgia. Pt is prescribed klonopin 0.5 mg BID for anxiety by her psychiatrist, but she reports taking more than that for her anxiety. Pt is taking topamax 100 mg qhs for the past month for migraines. Pt reports taking paxil in the past, which caused her to have suicidal thoughts. She has 2 children. No current insurance, but she started working in the [facility] ED 1 wk ago. Collateral info obtained from mother and sister, who came to visit pt: They report pt has had a tough year. Pt has been diagnosed with pseudoseizures, and pt's memory has been affected. Pt's last pregnancy was traumatic for her. Father has not been involved. Husband left her last week, and pt feels guilty about that. They do not feel that pt is a threat to herself or others at this time. They want to make sure she gets the help she needs for her depression, pseudoseizures, and anxiety.  Interval history: Patient was seen face-to-face for this evaluation for psychiatric consultation follow up from this weekend. Patient reported she has been compliant with medication and tolerating well. Patient continued to feel depression, anxiety but no suicidal or homicidal ideation. Patient friend is at bedside. Patient reportedly struggling with multiple emotional and family related stresses. Patient has more nonepileptic seizures since yesterday. 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 Patient denies psychotic symptoms.  Past Medical History:  Past Medical History  Diagnosis Date  . Migraines   . Interstitial cystitis   . IUD FEB 2010  . Gastritis JULY 2011  . Depression   . 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 Fatty liver   . Obesity (BMI 30-39.9) 2011 228 LBS BMI 36.8  . Iron deficiency anemia 07/23/2010  . Irritable bowel syndrome 2012 DIARRHEA    JUN 2012 TTG IgA 14.9  . Elevated liver enzymes JUL 2011ALK PHOS 111-127 AST  143-267 ALT  213-321T BILI 0.6  ALB  3.7-4.06 Jun 2011 ALK PHOS 118 AST 24 ALT 42 T BILI 0.4 ALB 3.9  . Chronic daily headache   . Ovarian cyst   . PONV (postoperative nausea and vomiting)   . ADD (attention deficit disorder)   . RLQ abdominal pain 07/18/2013  . Menorrhagia 07/18/2013  . Anxiety   . Potassium (K) deficiency   . Dysmenorrhea 09/18/2013  . Stress 09/18/2013  . Patient desires pregnancy 09/18/2013  . Pregnant 12/25/2013  . Blood transfusion without reported diagnosis   . Heart murmur   . Gastric bypass status for obesity   . 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 Dysrhythmia   . Sciatica of left side 11/14/2014  . Fibromyalgia   . 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    Past Surgical History  Procedure Laterality Date  . Gab  2007    in [place]-POUCH 5  CM  . Cholecystectomy  2005    biliary dyskinesia  . Cath self every nite      for sodium bicarb injection (discontinued 2013)  . Colonoscopy  JUN 2012 ABD PN/DIARRHEA WITH PROPOFOL    NL COLON  . Upper gastrointestinal endoscopy  JULY 2011 NAUSEA-D125,V6, PH 25    Bx; GASTRITIS, POUCH-5 CM LONG  . Dilation and curettage of uterus    . Tonsillectomy    . Gastric bypass  06/2006  . Savory dilation  06/20/2012    MBE:MLJQ gastritis/Ulcer in the mid jejunum  . Tonsillectomy and adenoidectomy    . Esophagogastroduodenoscopy    . Wisdom tooth extraction    . Repair vaginal  cuff N/A 07/30/2014    Procedure: REPAIR VAGINAL CUFF;  Surgeon: Mora Bellman, MD;  Location: [facility];  Service: Gynecology;  Laterality: N/A;  . Hysteroscopy w/d&c N/A 09/12/2014    Procedure: DILATATION AND CURETTAGE /HYSTEROSCOPY;  Surgeon: Jonnie Kind, MD;  Location: [facility];  Service: Gynecology;  Laterality: N/A;   Family History:  Family History  Problem Relation Age of Onset  . Hemochromatosis Maternal Grandmother   . Migraines Maternal Grandmother   . Cancer Maternal Grandmother   . Breast cancer Maternal Grandmother   . Hypertension Father   . Diabetes Father   . Coronary artery disease Father   . Migraines Paternal Grandmother   . Breast cancer Paternal Grandmother   . Cancer Mother     breast  . Hemochromatosis Mother   . Breast cancer Mother   . Coronary artery disease Paternal Grandfather    Social History:  History  Alcohol Use No     History  Drug Use No    History   Social History  . Marital Status: Married    Spouse Name: N/A  . Number of Children: 2  . Years of Education: some colle   Occupational History  .     Social History Main Topics  . Smoking status: Current Every Day Smoker -- 0.50 packs/day for 6 years    Types: Cigarettes  . Smokeless tobacco: Never Used  . Alcohol Use: No  . Drug Use: No  . Sexual Activity: Not Currently    Birth Control/ Protection: None   Other Topics Concern  . None   Social History Narrative   Patient is right handed.   Patient drinks one cup of coffee daily.     Allergies:   Allergies  Allergen Reactions  . Gabapentin Other (See Comments)    Pt states that she was unresponsive after taking this medication, but her vitals remained stable.    . Metoclopramide Hcl Anxiety and Other (See Comments)    Pt states that she felt like she was trapped in a box, and could not get out.  Pt also states that she had temporary loss of movement, weakness, and tingling.    . Codeine Nausea And Vomiting  . Zofran  [Ondansetron] Other (See Comments)    Migraines   . Latex Rash  . Tape Itching and Rash    Please use paper tape    Labs:  Results for orders placed or performed during the hospital encounter of 02/22/15 (from the past 48 hour(s))  CBC WITH DIFFERENTIAL     Status: Abnormal   Collection Time: 02/22/15  8:50 PM  Result Value Ref Range   WBC 7.4 4.0 - 10.5 K/uL   RBC 4.00 3.87 - 5.11 MIL/uL   Hemoglobin 9.4 (L) 12.0 - 15.0  g/dL   HCT 30.4 (L) 36.0 - 46.0 %   MCV 76.0 (L) 78.0 - 100.0 fL   MCH 23.5 (L) 26.0 - 34.0 pg   MCHC 30.9 30.0 - 36.0 g/dL   RDW 17.2 (H) 11.5 - 15.5 %   Platelets 414 (H) 150 - 400 K/uL   Neutrophils Relative % 55 43 - 77 %   Neutro Abs 4.0 1.7 - 7.7 K/uL   Lymphocytes Relative 35 12 - 46 %   Lymphs Abs 2.6 0.7 - 4.0 K/uL   Monocytes Relative 9 3 - 12 %   Monocytes Absolute 0.7 0.1 - 1.0 K/uL   Eosinophils Relative 1 0 - 5 %   Eosinophils Absolute 0.1 0.0 - 0.7 K/uL   Basophils Relative 0 0 - 1 %   Basophils Absolute 0.0 0.0 - 0.1 K/uL  Comprehensive metabolic panel     Status: Abnormal   Collection Time: 02/22/15  8:50 PM  Result Value Ref Range   Sodium 135 135 - 145 mmol/L   Potassium 4.3 3.5 - 5.1 mmol/L   Chloride 105 96 - 112 mmol/L   CO2 20 19 - 32 mmol/L   Glucose, Bld 95 70 - 99 mg/dL   BUN 16 6 - 23 mg/dL   Creatinine, Ser 0.89 0.50 - 1.10 mg/dL   Calcium 9.2 8.4 - 10.5 mg/dL   Total Protein 7.8 6.0 - 8.3 g/dL   Albumin 4.3 3.5 - 5.2 g/dL   AST 23 0 - 37 U/L   ALT 14 0 - 35 U/L   Alkaline Phosphatase 104 39 - 117 U/L   Total Bilirubin 0.7 0.3 - 1.2 mg/dL   GFR calc non Af Amer 88 (L) >90 mL/min   GFR calc Af Amer >90 >90 mL/min    Comment: (NOTE) The eGFR has been calculated using the CKD EPI equation. This calculation has not been validated in all clinical situations. eGFR's persistently <90 mL/min signify possible Chronic Kidney Disease.    Anion gap 10 5 - 15  Ethanol/ETOH     Status: Abnormal   Collection Time: 02/22/15  8:50  PM  Result Value Ref Range   Alcohol, Ethyl (B) 20 (H) 0 - 9 mg/dL    Comment:        LOWEST DETECTABLE LIMIT FOR SERUM ALCOHOL IS 11 mg/dL FOR MEDICAL PURPOSES ONLY   I-Stat Beta hCG blood, ED ([facility], [facility], [facility] only)     Status: None   Collection Time: 02/22/15  8:52 PM  Result Value Ref Range   I-stat hCG, quantitative <5.0 <5 mIU/mL   Comment 3            Comment:   GEST. AGE      CONC.  (mIU/mL)   <=1 WEEK        5 - 50     2 WEEKS       50 - 500     3 WEEKS       100 - 10,000     4 WEEKS     1,000 - 30,000        FEMALE AND NON-PREGNANT FEMALE:     LESS THAN 5 mIU/mL   Drug screen panel, emergency     Status: Abnormal   Collection Time: 02/22/15 10:12 PM  Result Value Ref Range   Opiates NONE DETECTED NONE DETECTED   Cocaine NONE DETECTED NONE DETECTED   Benzodiazepines POSITIVE (A) NONE DETECTED   Amphetamines NONE DETECTED NONE DETECTED  Tetrahydrocannabinol NONE DETECTED NONE DETECTED   Barbiturates NONE DETECTED NONE DETECTED    Comment:        DRUG SCREEN FOR MEDICAL PURPOSES ONLY.  IF CONFIRMATION IS NEEDED FOR ANY PURPOSE, NOTIFY LAB WITHIN 5 DAYS.        LOWEST DETECTABLE LIMITS FOR URINE DRUG SCREEN Drug Class       Cutoff (ng/mL) Amphetamine      1000 Barbiturate      200 Benzodiazepine   588 Tricyclics       502 Opiates          300 Cocaine          300 THC              50   Urinalysis, Routine w reflex microscopic     Status: Abnormal   Collection Time: 02/22/15 10:12 PM  Result Value Ref Range   Color, Urine YELLOW YELLOW   APPearance CLOUDY (A) CLEAR   Specific Gravity, Urine 1.009 1.005 - 1.030   pH 6.0 5.0 - 8.0   Glucose, UA NEGATIVE NEGATIVE mg/dL   Hgb urine dipstick TRACE (A) NEGATIVE   Bilirubin Urine NEGATIVE NEGATIVE   Ketones, ur NEGATIVE NEGATIVE mg/dL   Protein, ur NEGATIVE NEGATIVE mg/dL   Urobilinogen, UA 0.2 0.0 - 1.0 mg/dL   Nitrite NEGATIVE NEGATIVE   Leukocytes, UA MODERATE (A) NEGATIVE  Urine pregnancy     Status: None    Collection Time: 02/22/15 10:12 PM  Result Value Ref Range   Preg Test, Ur NEGATIVE NEGATIVE    Comment:        THE SENSITIVITY OF THIS METHODOLOGY IS >20 mIU/mL.   Urine microscopic-add on     Status: Abnormal   Collection Time: 02/22/15 10:12 PM  Result Value Ref Range   Squamous Epithelial / LPF FEW (A) RARE   WBC, UA 7-10 <3 WBC/hpf   RBC / HPF 0-2 <3 RBC/hpf   Bacteria, UA FEW (A) RARE  T4, free     Status: None   Collection Time: 02/23/15 12:17 AM  Result Value Ref Range   Free T4 0.85 0.80 - 1.80 ng/dL    Comment: Performed at [facility]  TSH     Status: None   Collection Time: 02/23/15 12:17 AM  Result Value Ref Range   TSH 0.945 0.350 - 4.500 uIU/mL  Basic metabolic panel     Status: Abnormal   Collection Time: 02/23/15  7:10 AM  Result Value Ref Range   Sodium 139 135 - 145 mmol/L   Potassium 3.8 3.5 - 5.1 mmol/L   Chloride 111 96 - 112 mmol/L   CO2 24 19 - 32 mmol/L   Glucose, Bld 95 70 - 99 mg/dL   BUN 12 6 - 23 mg/dL   Creatinine, Ser 0.70 0.50 - 1.10 mg/dL   Calcium 8.3 (L) 8.4 - 10.5 mg/dL   GFR calc non Af Amer >90 >90 mL/min   GFR calc Af Amer >90 >90 mL/min    Comment: (NOTE) The eGFR has been calculated using the CKD EPI equation. This calculation has not been validated in all clinical situations. eGFR's persistently <90 mL/min signify possible Chronic Kidney Disease.    Anion gap 4 (L) 5 - 15  CBC     Status: Abnormal   Collection Time: 02/23/15  7:10 AM  Result Value Ref Range   WBC 4.8 4.0 - 10.5 K/uL   RBC 3.13 (L) 3.87 - 5.11 MIL/uL  Hemoglobin 7.3 (L) 12.0 - 15.0 g/dL    Comment: DELTA CHECK NOTED REPEATED TO VERIFY    HCT 24.2 (L) 36.0 - 46.0 %   MCV 77.3 (L) 78.0 - 100.0 fL   MCH 23.3 (L) 26.0 - 34.0 pg   MCHC 30.2 30.0 - 36.0 g/dL   RDW 17.6 (H) 11.5 - 15.5 %   Platelets 281 150 - 400 K/uL    Comment: SPECIMEN CHECKED FOR CLOTS REPEATED TO VERIFY   Occult blood card to lab, stool RN will collect     Status: None    Collection Time: 02/23/15  5:31 PM  Result Value Ref Range   Fecal Occult Bld NEGATIVE NEGATIVE  CBC     Status: Abnormal   Collection Time: 02/24/15  6:23 AM  Result Value Ref Range   WBC 5.2 4.0 - 10.5 K/uL   RBC 3.43 (L) 3.87 - 5.11 MIL/uL   Hemoglobin 8.0 (L) 12.0 - 15.0 g/dL   HCT 26.6 (L) 36.0 - 46.0 %   MCV 77.6 (L) 78.0 - 100.0 fL   MCH 23.3 (L) 26.0 - 34.0 pg   MCHC 30.1 30.0 - 36.0 g/dL   RDW 17.6 (H) 11.5 - 15.5 %   Platelets 293 150 - 400 K/uL  Basic metabolic panel     Status: None   Collection Time: 02/24/15  6:23 AM  Result Value Ref Range   Sodium 138 135 - 145 mmol/L   Potassium 3.8 3.5 - 5.1 mmol/L   Chloride 109 96 - 112 mmol/L   CO2 23 19 - 32 mmol/L   Glucose, Bld 81 70 - 99 mg/dL   BUN 15 6 - 23 mg/dL   Creatinine, Ser 0.68 0.50 - 1.10 mg/dL   Calcium 8.7 8.4 - 10.5 mg/dL   GFR calc non Af Amer >90 >90 mL/min   GFR calc Af Amer >90 >90 mL/min    Comment: (NOTE) The eGFR has been calculated using the CKD EPI equation. This calculation has not been validated in all clinical situations. eGFR's persistently <90 mL/min signify possible Chronic Kidney Disease.    Anion gap 6 5 - 15    Vitals: Blood pressure 104/54, pulse 74, temperature 98.1 F (36.7 C), temperature source Oral, resp. rate 19, height _0  (1.676 m), weight 110.1 kg (242 lb 11.6 oz), last menstrual period 02/16/2015, SpO2 100 %, not currently breastfeeding.  Risk to Self: Is patient at risk for suicide?: No Risk to Others:   Prior Inpatient Therapy:   Prior Outpatient Therapy:    Current Facility-Administered Medications  Medication Dose Route Frequency Provider Last Rate Last Dose  . acetaminophen (TYLENOL) tablet 650 mg  650 mg Oral Q6H PRN Theressa Millard, MD       Or  . acetaminophen (TYLENOL) suppository 650 mg  650 mg Rectal Q6H PRN Theressa Millard, MD      . alum & mag hydroxide-simeth (MAALOX/MYLANTA) 200-200-20 MG/5ML suspension 30 mL  30 mL Oral Q6H PRN Theressa Millard, MD      . busPIRone (BUSPAR) tablet 7.5 mg  7.5 mg Oral BID Skip Estimable, MD   7.5 mg at 02/24/15 0958  . butalbital-acetaminophen-caffeine (FIORICET, ESGIC) 50-325-40 MG per tablet 1 tablet  1 tablet Oral Q4H PRN Albertine Patricia, MD   1 tablet at 02/24/15 1221  . cefTRIAXone (ROCEPHIN) 1 g in dextrose 5 % 50 mL IVPB - Premix  1 g Intravenous Q24H Albertine Patricia, MD  1 g at 02/23/15 1557  . clonazePAM (KLONOPIN) tablet 0.5 mg  0.5 mg Oral TID Theressa Millard, MD   0.5 mg at 02/24/15 0958  . DULoxetine (CYMBALTA) DR capsule 30 mg  30 mg Oral Daily Theressa Millard, MD   30 mg at 02/24/15 8416   And  . DULoxetine (CYMBALTA) DR capsule 60 mg  60 mg Oral QHS Theressa Millard, MD   60 mg at 02/23/15 2140  . ferrous sulfate tablet 325 mg  325 mg Oral BID WC Theressa Millard, MD   325 mg at 02/24/15 0853  . oxyCODONE (Oxy IR/ROXICODONE) immediate release tablet 5 mg  5 mg Oral Q4H PRN Theressa Millard, MD   5 mg at 02/24/15 0958  . prenatal multivitamin tablet 1 tablet  1 tablet Oral Q1200 Theressa Millard, MD   1 tablet at 02/24/15 1152  . promethazine (PHENERGAN) tablet 12.5 mg  12.5 mg Oral Q6H PRN Silver Huguenin Elgergawy, MD      . sodium chloride 0.9 % injection 3 mL  3 mL Intravenous Q12H Theressa Millard, MD   3 mL at 02/24/15 1006  . topiramate (TOPAMAX) tablet 100 mg  100 mg Oral QHS Theressa Millard, MD   100 mg at 02/23/15 2140    Musculoskeletal: Strength & Muscle Tone: within normal limits Gait & Station: normal Patient leans: N/A  Psychiatric Specialty Exam: Physical Exam  ROS  Blood pressure 104/54, pulse 74, temperature 98.1 F (36.7 C), temperature source Oral, resp. rate 19, height _0  (1.676 m), weight 110.1 kg (242 lb 11.6 oz), last menstrual period 02/16/2015, SpO2 100 %, not currently breastfeeding.Body mass index is 39.2 kg/(m^2).  General Appearance: Fairly Groomed  Engineer, water::  Minimal  Speech:  Normal Rate  Volume:  Normal  Mood:   Depressed and Irritable  Affect:  Congruent, Depressed and Tearful  Thought Process:  Coherent, Goal Directed and Logical  Orientation:  Full (Time, Place, and Person)  Thought Content:  Negative  Suicidal Thoughts:  No  Homicidal Thoughts:  No  Memory:  Immediate;   Fair Recent;   Fair Remote;   Fair  Judgement:  Intact  Insight:  Fair  Psychomotor Activity:  Normal  Concentration:  Fair  Recall:  [employer] [place]  Language: Fair  Akathisia:  Negative  Handed:  Right  AIMS (if indicated):     Assets:  Communication Skills Desire for Improvement Housing Social Support Talents/Skills Transportation Vocational/Educational  ADL's:  Intact  Cognition: WNL  Sleep:      Medical Decision Making: New problem, with additional work up planned, Review of Psycho-Social Stressors (1), Discuss test with performing physician (1), Review of Medication Regimen & Side Effects (2) and Review of New Medication or Change in Dosage (2)  Treatment Plan Summary: Daily contact with patient to assess and evaluate symptoms and progress in treatment, Medication management and Plan see below  Patient may continue cymbalta 30 mg qam and 60 mg qhs,  for postpartum depression since dose was just increased 1 week ago by her neurologist.  Continue clonazepam 0.5 mg bid-tid prn anxiety.  Continue buspar 7.5 mg bid to augment for depression/anxiety symptoms.  Patient agrees to f/u with her outpatient psychiatrist and therapist, and I emphasized the importance of therapy in her treatment.  Plan: No evidence of imminent risk to self or others at present.   Patient does not meet criteria for psychiatric inpatient admission. Supportive therapy provided about  ongoing stressors. Discussed crisis plan, support from social network, calling 911, coming to the Emergency Department, and calling Suicide Hotline.   Disposition: Pt may be discharged to home, once medically cleared. Pt needs to follow up  with her outpatient psychiatrist and therapist.  Durward Parcel. 02/24/2015 1:12 PM

## 2015-02-24 NOTE — Progress Notes (Signed)
Penny [name] to be D/C'd Home per MD order.  Discussed with the patient and all questions fully answered.  VSS, Skin clean, dry and intact without evidence of skin break down, no evidence of skin tears noted. IV catheter discontinued intact. Site without signs and symptoms of complications. Dressing and pressure applied.  An After Visit Summary was printed and given to the patient. Patient received prescriptions. Patient received order from MD not to drive until otherwise indicated from Primary Care MD or Neuro MD outpatient, patient verbalized understanding. Patient also received handouts with information about diagnoses, patient verbalized understanding.   D/c education completed with patient/family including follow up instructions, medication list, d/c activities limitations if indicated, with other d/c instructions as indicated by MD - patient able to verbalize understanding, all questions fully answered.   Patient instructed to return to ED, call 911, or call MD for any changes in condition.   Patient escorted via [place], and D/C home via private auto.  L'ESPERANCE, RACHEL C 02/24/2015 4:49 PM

## 2015-02-24 NOTE — Progress Notes (Signed)
Subjective:  patient currently alert and oriented.  Pych in room with patient.  Objective: Current vital signs: BP 104/54 mmHg  Pulse 74  Temp(Src) 98.1 F (36.7 C) (Oral)  Resp 19  Ht 5\' 6"  (1.676 m)  Wt 110.1 kg (242 lb 11.6 oz)  BMI 39.20 kg/m2  SpO2 100%  LMP 02/16/2015 Vital signs in last 24 hours: Temp:  [97.3 F (36.3 C)-98.4 F (36.9 C)] 98.1 F (36.7 C) (03/21 0520) Pulse Rate:  [74-101] 74 (03/21 0520) Resp:  [17-24] 19 (03/21 0520) BP: (104-150)/(54-90) 104/54 mmHg (03/21 0520) SpO2:  [98 %-100 %] 100 % (03/21 0520)  Intake/Output from previous day: 03/20 0701 - 03/21 0700 In: 1061.7 [P.O.:480; I.V.:581.7] Out: 2200 [Urine:2200] Intake/Output this shift: Total I/O In: 3 [I.V.:3] Out: 600 [Urine:600] Nutritional status: Diet regular  Neurologic Exam: General: NAD Mental Status: Alert, oriented, thought content appropriate.  Speech fluent without evidence of aphasia.  Able to follow 3 step commands without difficulty. Cranial Nerves: II: Discs flat bilaterally; Visual fields grossly normal, pupils equal, round, reactive to light and accommodation III,IV, VI: ptosis not present, extra-ocular motions intact bilaterally V,VII: smile symmetric, facial light touch sensation normal bilaterally VIII: hearing normal bilaterally IX,X: uvula rises symmetrically XI: bilateral shoulder shrug XII: midline tongue extension without atrophy or fasciculations  Motor: Right : Upper extremity   5/5    Left:     Upper extremity   5/5  Lower extremity   5/5     Lower extremity   5/5 Tone and bulk:normal tone throughout; no atrophy noted Sensory: Pinprick and light touch intact throughout, bilaterally Deep Tendon Reflexes:  Right: Upper Extremity   Left: Upper extremity   biceps (C-5 to C-6) 2/4   biceps (C-5 to C-6) 2/4 tricep (C7) 2/4    triceps (C7) 2/4 Brachioradialis (C6) 2/4  Brachioradialis (C6) 2/4  Lower Extremity Lower Extremity  quadriceps (L-2 to L-4) 2/4    quadriceps (L-2 to L-4) 2/4 Achilles (S1) 2/4   Achilles (S1) 2/4  Plantars: Right: downgoing   Left: downgoing    Lab Results: Basic Metabolic Panel:  Recent Labs Lab 02/22/15 2050 02/23/15 0710 02/24/15 0623  NA 135 139 138  K 4.3 3.8 3.8  CL 105 111 109  CO2 20 24 23   GLUCOSE 95 95 81  BUN 16 12 15   CREATININE 0.89 0.70 0.68  CALCIUM 9.2 8.3* 8.7    Liver Function Tests:  Recent Labs Lab 02/22/15 2050  AST 23  ALT 14  ALKPHOS 104  BILITOT 0.7  PROT 7.8  ALBUMIN 4.3   No results for input(s): LIPASE, AMYLASE in the last 168 hours. No results for input(s): AMMONIA in the last 168 hours.  CBC:  Recent Labs Lab 02/17/15 1337 02/18/15 1152 02/22/15 2050 02/23/15 0710 02/24/15 0623  WBC  --   --  7.4 4.8 5.2  NEUTROABS  --   --  4.0  --   --   HGB 8.7* 8.1* 9.4* 7.3* 8.0*  HCT  --   --  30.4* 24.2* 26.6*  MCV  --   --  76.0* 77.3* 77.6*  PLT  --   --  414* 281 293    Cardiac Enzymes: No results for input(s): CKTOTAL, CKMB, CKMBINDEX, TROPONINI in the last 168 hours.  Lipid Panel: No results for input(s): CHOL, TRIG, HDL, CHOLHDL, VLDL, LDLCALC in the last 168 hours.  CBG: No results for input(s): GLUCAP in the last 168 hours.  Microbiology: Results for orders placed  or performed in visit on 02/17/15  Urine culture     Status: None   Collection Time: 02/17/15 12:00 AM  Result Value Ref Range Status   Urine Culture, Routine Final report  Final   Result 1 Comment  Final    Comment: Culture shows less than 10,000 colony forming units of bacteria per milliliter of urine. This colony count is not generally considered to be clinically significant.     Coagulation Studies: No results for input(s): LABPROT, INR in the last 72 hours.  Imaging: No results found.  Medications:  Scheduled: . busPIRone  7.5 mg Oral BID  . cefTRIAXone (ROCEPHIN)  IV  1 g Intravenous Q24H  . clonazePAM  0.5 mg Oral TID  . DULoxetine  30 mg Oral Daily   And   . DULoxetine  60 mg Oral QHS  . ferrous sulfate  325 mg Oral BID WC  . prenatal multivitamin  1 tablet Oral Q1200  . sodium chloride  3 mL Intravenous Q12H  . topiramate  100 mg Oral QHS    Assessment/Plan:   29 year old female with recurrent nonepileptic events with increased stressors at home. Psych has evaluated and given medication recommendations. Would not give further ativan. Patient unable to drive, operate heavy machinery, perform activities at heights and participate in water activities until release by outpatient physician. Patient made aware.  Neurology will S/O     Etta Quill PA-C [facility] [phone]  02/24/2015, 11:36 AM

## 2015-02-24 NOTE — Progress Notes (Signed)
RN walked in at 0023 and noticed pt having seizure-like activity-lasting approximately 15 min. Posture rigid, unresponsive to commands. O2 sats at 100%. All 4 side rails up, pillows placed around head for safety. 1mg  PRN ativan given. Vital signs stable. Pt responded to medication and is alert. Will continue to monitor.

## 2015-02-24 NOTE — Discharge Instructions (Signed)
Follow with Primary MD LUKING,SCOTT, MD in 7 days   Patient unable to drive, operate heavy machinery, perform activities at heights and participate in water activities until release by outpatient physician.  Get CBC, CMP, 2 view Chest X ray checked  by Primary MD next visit.    Activity: As tolerated with Full fall precautions use walker/cane & assistance as needed   Disposition Home    Diet: Heart Healthy  , with feeding assistance and aspiration precautions as needed.  For Heart failure patients - Check your Weight same time everyday, if you gain over 2 pounds, or you develop in leg swelling, experience more shortness of breath or chest pain, call your Primary MD immediately. Follow Cardiac Low Salt Diet and 1.5 lit/day fluid restriction.   On your next visit with your primary care physician please Get Medicines reviewed and adjusted.   Please request your Prim.MD to go over all Hospital Tests and Procedure/Radiological results at the follow up, please get all Hospital records sent to your Prim MD by signing hospital release before you go home.   If you experience worsening of your admission symptoms, develop shortness of breath, life threatening emergency, suicidal or homicidal thoughts you must seek medical attention immediately by calling 911 or calling your MD immediately  if symptoms less severe.  You Must read complete instructions/literature along with all the possible adverse reactions/side effects for all the Medicines you take and that have been prescribed to you. Take any new Medicines after you have completely understood and accpet all the possible adverse reactions/side effects.   Do not drive, operating heavy machinery, perform activities at heights, swimming or participation in water activities or provide baby sitting services if your were admitted for syncope or siezures until you have seen by Primary MD or a Neurologist and advised to do so again.  Do not drive when  taking Pain medications.    Do not take more than prescribed Pain, Sleep and Anxiety Medications  Special Instructions: If you have smoked or chewed Tobacco  in the last 2 yrs please stop smoking, stop any regular Alcohol  and or any Recreational drug use.  Wear Seat belts while driving.   Please note  You were cared for by a hospitalist during your hospital stay. If you have any questions about your discharge medications or the care you received while you were in the hospital after you are discharged, you can call the unit and asked to speak with the hospitalist on call if the hospitalist that took care of you is not available. Once you are discharged, your primary care physician will handle any further medical issues. Please note that NO REFILLS for any discharge medications will be authorized once you are discharged, as it is imperative that you return to your primary care physician (or establish a relationship with a primary care physician if you do not have one) for your aftercare needs so that they can reassess your need for medications and monitor your lab values.

## 2015-02-24 NOTE — Discharge Summary (Signed)
Penny [name], 29 y.o., DOB 1986/02/16, MRN [id number]. Admission date: 02/22/2015 Discharge Date 02/24/2015 Primary MD [name], MD Admitting Physician Ron Parker, MD   PCP please follow on: - Check CBC, BMP during next visit. - He is follow on the results of urine cultures, patient will be treated with total of 5 days of antibiotics.  Admission Diagnosis  syncope  Discharge Diagnosis   Principal Problem:   Seizure-like activity Active Problems:   Iron deficiency anemia   Depression with anxiety   Pseudoseizure      Past Medical History  Diagnosis Date  . Migraines   . Interstitial cystitis   . IUD FEB 2010  . Gastritis JULY 2011  . Depression   . 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 Fatty liver   . Obesity (BMI 30-39.9) 2011 228 LBS BMI 36.8  . Iron deficiency anemia 07/23/2010  . Irritable bowel syndrome 2012 DIARRHEA    JUN 2012 TTG IgA 14.9  . Elevated liver enzymes JUL 2011ALK PHOS 111-127 AST  143-267 ALT  213-321T BILI 0.6  ALB  3.7-4.06 Jun 2011 ALK PHOS 118 AST 24 ALT 42 T BILI 0.4 ALB 3.9  . Chronic daily headache   . Ovarian cyst   . PONV (postoperative nausea and vomiting)   . ADD (attention deficit disorder)   . RLQ abdominal pain 07/18/2013  . Menorrhagia 07/18/2013  . Anxiety   . Potassium (K) deficiency   . Dysmenorrhea 09/18/2013  . Stress 09/18/2013  . Patient desires pregnancy 09/18/2013  . Pregnant 12/25/2013  . Blood transfusion without reported diagnosis   . Heart murmur   . Gastric bypass status for obesity   . 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 Dysrhythmia   . Sciatica of left side 11/14/2014  . Fibromyalgia   . 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    Past Surgical History  Procedure Laterality Date  . Gab  2007    in [place]-POUCH 5 CM  . Cholecystectomy  2005    biliary dyskinesia  . Cath self every nite      for sodium bicarb injection (discontinued 2013)  . Colonoscopy  JUN 2012  ABD PN/DIARRHEA WITH PROPOFOL    NL COLON  . Upper gastrointestinal endoscopy  JULY 2011 NAUSEA-D125,V6, PH 25    Bx; GASTRITIS, POUCH-5 CM LONG  . Dilation and curettage of uterus    . Tonsillectomy    . Gastric bypass  06/2006  . Savory dilation  06/20/2012    WUJ:WJXB gastritis/Ulcer in the mid jejunum  . Tonsillectomy and adenoidectomy    . Esophagogastroduodenoscopy    . Wisdom tooth extraction    . Repair vaginal cuff N/A 07/30/2014    Procedure: REPAIR VAGINAL CUFF;  Surgeon: Catalina Antigua, MD;  Location: [facility];  Service: Gynecology;  Laterality: N/A;  . Hysteroscopy w/d&c N/A 09/12/2014    Procedure: DILATATION AND CURETTAGE /HYSTEROSCOPY;  Surgeon: Tilda Burrow, MD;  Location: [facility];  Service: Gynecology;  Laterality: N/A;     Hospital Course See H&P, Labs, Consult and Test reports for all details in brief, patient was admitted for **  Principal Problem:   Seizure-like activity Active Problems:   Iron deficiency anemia   Depression with anxiety   Pseudoseizure  Admission history of present illness/brief narrative: Penny [name] is a 29 y.o. female with history of Pseudoseizures ( had extensive evaluation including MRI brain/MRA of the brain/EEG/long-term monitoring. It  was felt that she likely has pseudoseizures) and history of Post Partum Depression, Migraines, and Anxiety was Found outside of the [facility] with Seizure -like activity described as tonic Clonic and posturing without loss of consciousness lasting 10 -125 minutes and then recurred. She was medicated with IV Ativan and loaded with IV Keppra, and Neurology was consulted and saw her in the ED , patient reports stressors in life including her husband leaving her last week, at this point no further workup recommended by ED, no new antiepileptic medication, or diazepam, they recommend psychiatric evaluation. - Patient workup was significant for anemia, and UTI.  Seizure-like activity: - Neuro  consult appreciated,  no initiation of anticonvulsant therapy, no further Ativan . - Extensive workup was done as an outpatient, it was felt to be secondary to pseudoseizures. - Patient unable to drive, operate heavy machinery, perform activities at heights and participate in water activities until cleared by outpatient physician, patient was made aware. - Patient had significant stressors in her life, is likely contributing to her pseudoseizures.  UTI - Treated with Rocephin for 2 days, to finish another 3 days off oral Bactrim as an outpatient.   Anemia - Most likely related to iron deficiency, given her recent deficiency, and menstrual periods. Continue with iron supplements -  Hemoccult stool negative.  Depression and anxiety -  with Cymbalta and Klonopin, psychiatric consult appreciated, initiated on BuSpar, will continue to follow with her psychiatrist as an outpatient.  Migraine headaches - Continue with Topamax  Consults   Neurology Psychiatric  Significant Tests:  See full reports for all details    Dg Fluoro Guide Lumbar Puncture  01/29/2015   CLINICAL DATA:  Peripheral neuropathy. 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  EXAM: DIAGNOSTIC LUMBAR PUNCTURE UNDER FLUOROSCOPIC GUIDANCE  FLUOROSCOPY TIME:  Fluoroscopy Time (in minutes and seconds): 0 minutes 18 seconds  Number of Acquired Images:  0  PROCEDURE: Informed consent was obtained from the patient prior to the procedure, including potential complications of headache, allergy, and pain. With the patient prone, the lower back was prepped with Betadine. 1% Lidocaine was used for local anesthesia. Lumbar puncture was performed at the L2-3 level using a 6 inch, 20 gauge needle via a left paramedian approach with return of clear CSF with an opening pressure of 20 cm water (measured in the left lateral decubitus position). 7 ml of CSF were obtained for laboratory studies.  The patient tolerated the  procedure and there were no apparent complications. Procedure related pain was treated in the recovery area and the patient was discharged after approximately 35 minutes of observation in good condition.  IMPRESSION: Successful fluoroscopic guided lumbar puncture.   Electronically Signed   By: Sebastian Ache   On: 01/29/2015 16:13     Today   Subjective:   Penny [name] today has no headache,no chest abdominal pain,no new weakness tingling or numbness .  Objective:   Blood pressure 104/54, pulse 74, temperature 98.1 F (36.7 C), temperature source Oral, resp. rate 19, height 5\' 6"  (1.676 m), weight 110.1 kg (242 lb 11.6 oz), last menstrual period 02/16/2015, SpO2 100 %, not currently breastfeeding.  Intake/Output Summary (Last 24 hours) at 02/24/15 1341 Last data filed at 02/24/15 1006  Gross per 24 hour  Intake    243 ml  Output   2350 ml  Net  -2107 ml    Exam Awake Alert, Oriented *3, No new F.N deficits, Normal affect [place].AT,PERRAL Supple Neck,No JVD, No cervical lymphadenopathy appriciated.  Symmetrical Chest wall movement, Good air movement bilaterally, CTAB RRR,No Gallops,Rubs or new Murmurs, No Parasternal Heave +ve B.Sounds, Abd Soft, Non tender, No organomegaly appriciated, No rebound -guarding or rigidity. No Cyanosis, Clubbing or edema, No new Rash or bruise  Data Review   Cultures -  Results for orders placed or performed in visit on 02/17/15  Urine culture     Status: None   Collection Time: 02/17/15 12:00 AM  Result Value Ref Range Status   Urine Culture, Routine Final report  Final   Result 1 Comment  Final    Comment: Culture shows less than 10,000 colony forming units of bacteria per milliliter of urine. This colony count is not generally considered to be clinically significant.       CBC w Diff: Lab Results  Component Value Date   WBC 5.2 02/24/2015   HGB 8.0* 02/24/2015   HGB 8.1* 02/18/2015   HCT 26.6* 02/24/2015   PLT 293 02/24/2015    LYMPHOPCT 35 02/22/2015   MONOPCT 9 02/22/2015   EOSPCT 1 02/22/2015   BASOPCT 0 02/22/2015   CMP: Lab Results  Component Value Date   NA 138 02/24/2015   K 3.8 02/24/2015   CL 109 02/24/2015   CO2 23 02/24/2015   BUN 15 02/24/2015   CREATININE 0.68 02/24/2015   CREATININE 0.64 07/28/2014   CREATININE 0.83 11/16/2013   PROT 7.8 02/22/2015   PROT 6.7 01/15/2015   ALBUMIN 4.3 02/22/2015   BILITOT 0.7 02/22/2015   ALKPHOS 104 02/22/2015   AST 23 02/22/2015   ALT 14 02/22/2015  .  Micro Results Recent Results (from the past 240 hour(s))  Urine culture     Status: None   Collection Time: 02/17/15 12:00 AM  Result Value Ref Range Status   Urine Culture, Routine Final report  Final   Result 1 Comment  Final    Comment: Culture shows less than 10,000 colony forming units of bacteria per milliliter of urine. This colony count is not generally considered to be clinically significant.      Discharge Instructions      Follow-up Information    Follow up with [facility]     On 03/04/2015.   Why:  2 pm for hospital follow up   Contact information:   [address] [facility] [address] [phone]      Discharge Medications     Medication List    TAKE these medications        busPIRone 7.5 MG tablet  Commonly known as:  BUSPAR  Take 1 tablet (7.5 mg total) by mouth 2 (two) times daily.     Clobetasol Prop Emollient Base 0.05 % emollient cream  Apply small amount to affected area BID     clonazePAM 0.5 MG tablet  Commonly known as:  KLONOPIN  Take 1 tablet (0.5 mg total) by mouth 2 (two) times daily as needed for anxiety.     DULoxetine 30 MG capsule  Commonly known as:  CYMBALTA  Take 1-2 capsules (30-60 mg total) by mouth 2 (two) times daily. Take 1 capsule (30 mg) every morning and 2 capsules (60 mg) every night     ferrous sulfate 325 (65 FE) MG tablet  Take 1 tablet (325 mg total) by mouth 2 (two) times  daily with a meal.     ketorolac 10 MG tablet  Commonly known as:  TORADOL  Take 1 tablet (10 mg total) by mouth every  8 (eight) hours as needed for moderate pain.     prenatal multivitamin Tabs tablet  Take 1 tablet by mouth daily at 12 noon.     promethazine 12.5 MG tablet  Commonly known as:  PHENERGAN  Take 1 tablet (12.5 mg total) by mouth every 6 (six) hours as needed for nausea or vomiting.     sulfamethoxazole-trimethoprim 800-160 MG per tablet  Commonly known as:  BACTRIM DS  Take 1 tablet by mouth 2 (two) times daily.  Start taking on:  02/25/2015     topiramate 100 MG tablet  Commonly known as:  TOPAMAX  Take 1 tablet (100 mg total) by mouth at bedtime.     zolpidem 5 MG tablet  Commonly known as:  AMBIEN  Take 1 tablet (5 mg total) by mouth at bedtime as needed for sleep.         Total Time in preparing paper work, data evaluation and todays exam - 35 minutes  Penny [name] M.D on 02/24/2015 at 1:41 PM  [facility] Office  [phone]

## 2015-02-24 NOTE — Progress Notes (Signed)
UR completed 

## 2015-02-25 ENCOUNTER — Encounter (HOSPITAL_COMMUNITY): Payer: Self-pay | Admitting: Emergency Medicine

## 2015-02-25 ENCOUNTER — Emergency Department (HOSPITAL_COMMUNITY)
Admission: EM | Admit: 2015-02-25 | Discharge: 2015-02-25 | Disposition: A | Discharge status: Home / Self Care | Payer: Self-pay | Attending: Emergency Medicine | Admitting: Emergency Medicine

## 2015-02-25 DIAGNOSIS — G8929 Other chronic pain: Secondary | ICD-10-CM | POA: Insufficient documentation

## 2015-02-25 DIAGNOSIS — Z8679 Personal history of other diseases of the circulatory system: Secondary | ICD-10-CM | POA: Insufficient documentation

## 2015-02-25 DIAGNOSIS — R011 Cardiac murmur, unspecified: Secondary | ICD-10-CM | POA: Insufficient documentation

## 2015-02-25 DIAGNOSIS — Z720 Tobacco use: Secondary | ICD-10-CM | POA: Insufficient documentation

## 2015-02-25 DIAGNOSIS — Z87448 Personal history of other diseases of urinary system: Secondary | ICD-10-CM | POA: Insufficient documentation

## 2015-02-25 DIAGNOSIS — Z91040 Latex allergy status: Secondary | ICD-10-CM | POA: Insufficient documentation

## 2015-02-25 DIAGNOSIS — F419 Anxiety disorder, unspecified: Secondary | ICD-10-CM | POA: Insufficient documentation

## 2015-02-25 DIAGNOSIS — Z79899 Other long term (current) drug therapy: Secondary | ICD-10-CM | POA: Insufficient documentation

## 2015-02-25 DIAGNOSIS — Z862 Personal history of diseases of the blood and blood-forming organs and certain disorders involving the immune mechanism: Secondary | ICD-10-CM | POA: Insufficient documentation

## 2015-02-25 DIAGNOSIS — Z9884 Bariatric surgery status: Secondary | ICD-10-CM | POA: Insufficient documentation

## 2015-02-25 DIAGNOSIS — F445 Conversion disorder with seizures or convulsions: Secondary | ICD-10-CM

## 2015-02-25 DIAGNOSIS — F329 Major depressive disorder, single episode, unspecified: Secondary | ICD-10-CM | POA: Insufficient documentation

## 2015-02-25 DIAGNOSIS — R569 Unspecified convulsions: Secondary | ICD-10-CM | POA: Insufficient documentation

## 2015-02-25 LAB — URINE CULTURE

## 2015-02-25 MED ORDER — SODIUM CHLORIDE 0.9 % IV BOLUS (SEPSIS)
1000.0000 mL | Freq: Once | INTRAVENOUS | Status: AC
  Administered 2015-02-25: 1000 mL via INTRAVENOUS

## 2015-02-25 MED ORDER — SODIUM CHLORIDE 0.9 % IV BOLUS (SEPSIS)
500.0000 mL | Freq: Once | INTRAVENOUS | Status: AC
  Administered 2015-02-25: 500 mL via INTRAVENOUS

## 2015-02-25 MED ORDER — DIPHENHYDRAMINE HCL 50 MG/ML IJ SOLN
25.0000 mg | Freq: Once | INTRAMUSCULAR | Status: AC
  Administered 2015-02-25: 25 mg via INTRAVENOUS
  Filled 2015-02-25: qty 1

## 2015-02-25 MED ORDER — CYCLOBENZAPRINE HCL 10 MG PO TABS: 10.0000 mg | ORAL_TABLET | Freq: Two times a day (BID) | ORAL | Status: DC | PRN

## 2015-02-25 MED ORDER — KETOROLAC TROMETHAMINE 30 MG/ML IJ SOLN
30.0000 mg | Freq: Once | INTRAMUSCULAR | Status: AC
  Administered 2015-02-25: 30 mg via INTRAVENOUS
  Filled 2015-02-25: qty 1

## 2015-02-25 MED ORDER — PROMETHAZINE HCL 25 MG/ML IJ SOLN
25.0000 mg | Freq: Once | INTRAMUSCULAR | Status: AC
  Administered 2015-02-25: 25 mg via INTRAVENOUS
  Filled 2015-02-25: qty 1

## 2015-02-25 NOTE — Discharge Instructions (Signed)
Nonepileptic Seizures °Nonepileptic seizures are seizures that are not caused by abnormal electrical signals in your brain. These seizures often seem like epileptic seizures, but they are not caused by epilepsy.  °There are two types of nonepileptic seizures: °· A physiologic nonepileptic seizure results from a disruption in your brain. °· A psychogenic seizure results from emotional stress. These seizures are sometimes called pseudoseizures. °CAUSES  °Causes of physiologic nonepileptic seizures include:  °· Sudden drop in blood pressure. °· Low blood sugar. °· Low levels of salt (sodium) in your blood. °· Low levels of calcium in your blood. °· Migraine. °· Heart rhythm problems. °· Sleep disorders. °· Drug and alcohol abuse. °Common causes of psychogenic nonepileptic seizures include: °· Stress. °· Emotional trauma. °· Sexual or physical abuse. °· Major life events, such as divorce or the death of a loved one. °· Mental health disorders, including panic attack and hyperactivity disorder. °SIGNS AND SYMPTOMS °A nonepileptic seizure can look like an epileptic seizure, including uncontrollable shaking (convulsions), or changes in attention, behavior, or the ability to remain awake and alert. However, there are some differences. Nonepileptic seizures usually: °· Do not cause physical injuries. °· Start slowly. °· Include crying or shrieking. °· Last longer than 2 minutes. °· Have a short recovery time without headache or exhaustion. °DIAGNOSIS  °Your health care provider can usually diagnose nonepileptic seizures after taking your medical history and giving you a physical exam. Your health care provider may want to talk to your friends or relatives who have seen you have a seizure.  °You may also need to have tests to look for causes of physiologic nonepileptic seizures. This may include an electroencephalogram (EEG), which is a test that measures electrical activity in your brain. If you have had an epileptic  seizure, the results of your EEG will be abnormal. If your health care provider thinks you have had a psychogenic nonepileptic seizure, you may need to see a mental health specialist for an evaluation. °TREATMENT  °Treatment depends on the type and cause of your seizures. °· For physiologic nonepileptic seizures, treatment is aimed at addressing the underlying condition that caused the seizures. These seizures usually stop when the underlying condition is properly treated. °· Nonepileptic seizures do not respond to the seizure medicines used to treat epilepsy. °· For psychogenic seizures, you may need to work with a mental health specialist. °HOME CARE INSTRUCTIONS °Home care will depend on the type of nonepileptic seizures you have.  °· Follow all your health care provider's instructions. °· Keep all your follow-up appointments. °SEEK MEDICAL CARE IF: °You continue to have seizures after treatment. °SEEK IMMEDIATE MEDICAL CARE IF: °· Your seizures change or become more frequent. °· You injure yourself during a seizure. °· You have one seizure after another. °· You have trouble recovering from a seizure. °· You have chest pain or trouble breathing. °MAKE SURE YOU: °· Understand these instructions. °· Will watch your condition. °· Will get help right away if you are not doing well or get worse. °Document Released: 01/07/2006 Document Revised: 04/08/2014 Document Reviewed: 09/18/2013 °[employer]® Patient Information ©2015 [employer]. This information is not intended to replace advice given to you by your health care provider. Make sure you discuss any questions you have with your health care provider. ° °

## 2015-02-25 NOTE — ED Notes (Signed)
Per EMS: Pt having tonic seizures lasting about 30-45 seconds. EMS witnessed 2 seizures in the house before they got her out, and pt had one seizure en route. Given 2 mg Ativan IV. Pt has hx of seizures, was seen at [facility] yesterday for same. Pt post-ictal at this time, c/o pain to head and R side. Family states pt did not have any falls. CBG 111.

## 2015-02-25 NOTE — ED Provider Notes (Signed)
CSN: [id number]     Arrival date & time 02/25/15  1606 History   First MD Initiated Contact with Patient 02/25/15 1614     Chief Complaint  Patient presents with  . Seizures     (Consider location/radiation/quality/duration/timing/severity/associated sxs/prior Treatment) Patient is a 29 y.o. female presenting with seizures. The history is provided by the patient.  Seizures Seizure activity on arrival: no    patient was sent in by EMS with reported seizures. Reportedly had 2 seizures at home and then another in the annulus. Given Ativan by EMS. Patient states she was holding her 96-month-old daughter does not remember much else. 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 Has had previous extensive workup. States this is her typical seizure. States that she had them since before her pregnancy. States it gotten worse. Has been seen at [facility], no vomiting and the [facility] system. Has had unconscious in the seizure activity while on EEG and had no changes on the EEG. Patient states she has severe headache. Previous history of substance abuse and has reported stressors in her life recently with her husband went to divorce her.  Past Medical History  Diagnosis Date  . Migraines   . Interstitial cystitis   . IUD FEB 2010  . Gastritis JULY 2011  . Depression   . 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 Fatty liver   . Obesity (BMI 30-39.9) 2011 228 LBS BMI 36.8  . Iron deficiency anemia 07/23/2010  . Irritable bowel syndrome 2012 DIARRHEA    JUN 2012 TTG IgA 14.9  . Elevated liver enzymes JUL 2011ALK PHOS 111-127 AST  143-267 ALT  213-321T BILI 0.6  ALB  3.7-4.06 Jun 2011 ALK PHOS 118 AST 24 ALT 42 T BILI 0.4 ALB 3.9  . Chronic daily headache   . Ovarian cyst   . PONV (postoperative nausea and vomiting)   . ADD (attention deficit disorder)   . RLQ abdominal pain 07/18/2013  . Menorrhagia 07/18/2013  . Anxiety   . Potassium (K) deficiency   . Dysmenorrhea  09/18/2013  . Stress 09/18/2013  . Patient desires pregnancy 09/18/2013  . Pregnant 12/25/2013  . Blood transfusion without reported diagnosis   . Heart murmur   . Gastric bypass status for obesity   . 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 Dysrhythmia   . Sciatica of left side 11/14/2014  . Fibromyalgia   . 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 Gab  2007    in [place]-POUCH 5 CM  . Cholecystectomy  2005    biliary dyskinesia  . Cath self every nite      for sodium bicarb injection (discontinued 2013)  . Colonoscopy  JUN 2012 ABD PN/DIARRHEA WITH PROPOFOL    NL COLON  . Upper gastrointestinal endoscopy  JULY 2011 NAUSEA-D125,V6, PH 25    Bx; GASTRITIS, POUCH-5 CM LONG  . Dilation and curettage of uterus    . Tonsillectomy    . Gastric bypass  06/2006  . Savory dilation  06/20/2012    VJK:[id number] gastritis/Ulcer in the mid jejunum  . Tonsillectomy and adenoidectomy    . Esophagogastroduodenoscopy    . Wisdom tooth extraction    . Repair vaginal cuff N/A 07/30/2014    Procedure: REPAIR VAGINAL CUFF;  Surgeon: Mora Bellman, MD;  Location: [facility];  Service: Gynecology;  Laterality: N/A;  . Hysteroscopy w/d&c  N/A 09/12/2014    Procedure: DILATATION AND CURETTAGE /HYSTEROSCOPY;  Surgeon: Jonnie Kind, MD;  Location: [facility];  Service: Gynecology;  Laterality: N/A;   Family History  Problem Relation Age of Onset  . Hemochromatosis Maternal Grandmother   . Migraines Maternal Grandmother   . Cancer Maternal Grandmother   . Breast cancer Maternal Grandmother   . Hypertension Father   . Diabetes Father   . Coronary artery disease Father   . Migraines Paternal Grandmother   . Breast cancer Paternal Grandmother   . Cancer Mother     breast  . Hemochromatosis Mother   . Breast cancer Mother   . Coronary artery disease Paternal Grandfather    History  Substance Use Topics  . Smoking status: Current  Every Day Smoker -- 0.50 packs/day for 6 years    Types: Cigarettes  . Smokeless tobacco: Never Used  . Alcohol Use: No   OB History    Gravida Para Term Preterm AB TAB SAB Ectopic Multiple Living   '3 2 1 1 1  1   2     '$ Review of Systems  Constitutional: Negative for activity change and appetite change.  Eyes: Positive for photophobia and pain.  Respiratory: Negative for chest tightness and shortness of breath.   Cardiovascular: Negative for chest pain and leg swelling.  Gastrointestinal: Positive for vomiting. Negative for nausea, abdominal pain and diarrhea.  Genitourinary: Negative for flank pain.  Musculoskeletal: Positive for back pain. Negative for neck stiffness.  Skin: Negative for rash.  Neurological: Positive for seizures and headaches. Negative for weakness and numbness.  Psychiatric/Behavioral: Negative for behavioral problems.      Allergies  Gabapentin; Metoclopramide hcl; Codeine; Zofran; Latex; and Tape  Home Medications   Prior to Admission medications   Medication Sig Start Date End Date Taking? Authorizing Provider  busPIRone (BUSPAR) 7.5 MG tablet Take 1 tablet (7.5 mg total) by mouth 2 (two) times daily. 02/24/15  Yes Albertine Patricia, MD  Clobetasol Prop Emollient Base 0.05 % emollient cream Apply small amount to affected area BID Patient taking differently: Apply 1 application topically 2 (two) times daily. Use for 3 weeks starting 02/17/15 02/18/15  Yes Christin Fudge, CNM  clonazePAM (KLONOPIN) 0.5 MG tablet Take 1 tablet (0.5 mg total) by mouth 2 (two) times daily as needed for anxiety. 02/24/15  Yes Albertine Patricia, MD  DULoxetine (CYMBALTA) 30 MG capsule Take 1-2 capsules (30-60 mg total) by mouth 2 (two) times daily. Take 1 capsule (30 mg) every morning and 2 capsules (60 mg) every night 02/24/15  Yes Albertine Patricia, MD  ferrous sulfate 325 (65 FE) MG tablet Take 1 tablet (325 mg total) by mouth 2 (two) times daily with a meal. 02/06/15   Yes Okey Regal, PA-C  ketorolac (TORADOL) 10 MG tablet Take 1 tablet (10 mg total) by mouth every 8 (eight) hours as needed for moderate pain. 02/12/15  Yes Kathrynn Ducking, MD  promethazine (PHENERGAN) 12.5 MG tablet Take 1 tablet (12.5 mg total) by mouth every 6 (six) hours as needed for nausea or vomiting. 02/24/15  Yes Albertine Patricia, MD  topiramate (TOPAMAX) 100 MG tablet Take 1 tablet (100 mg total) by mouth at bedtime. 02/18/15  Yes Kathrynn Ducking, MD  zolpidem (AMBIEN) 5 MG tablet Take 1 tablet (5 mg total) by mouth at bedtime as needed for sleep. 12/26/14  Yes Kathyrn Drown, MD  cyclobenzaprine (FLEXERIL) 10 MG tablet Take 1 tablet (10 mg total)  by mouth 2 (two) times daily as needed for muscle spasms. 02/25/15   Davonna Belling, MD   BP 105/55 mmHg  Pulse 75  Temp(Src) 98 F (36.7 C) (Oral)  Resp 16  SpO2 99%  LMP 02/16/2015 Physical Exam  Constitutional: She is oriented to person, place, and time. She appears well-developed and well-nourished.  HENT:  Head: Normocephalic.  No bites on tongue.  Eyes:  Pupils dilated but reactive.  Neck: Neck supple.  Cardiovascular: Normal rate and regular rhythm.   Pulmonary/Chest: Effort normal and breath sounds normal.  Abdominal: Soft. There is no tenderness.  Musculoskeletal: Normal range of motion.  Neurological: She is alert and oriented to person, place, and time.  Skin: Skin is warm.    ED Course  Procedures (including critical care time) Labs Review Labs Reviewed - No data to display  Imaging Review No results found.   EKG Interpretation None      MDM   Final diagnoses:  Pseudoseizure    Patient with possible seizure. History of same that was diagnosed with pseudoseizure. There was a headache component that may be migraine. Discussed the patient and her mother. Will be discharged home. Doubt this was an actual seizure.    Davonna Belling, MD 02/28/15 1500

## 2015-02-25 NOTE — ED Notes (Signed)
Bed: WA01 Expected date:  Expected time:  Means of arrival:  Comments: EMS- seizures

## 2015-02-25 NOTE — ED Notes (Signed)
Pt requesting to be discharged  MD aware

## 2015-02-26 ENCOUNTER — Telehealth: Payer: Self-pay | Admitting: Neurology

## 2015-02-26 NOTE — Telephone Encounter (Signed)
Pt is calling stating she was admitted to the hospital on 3/19 and was discharged on 3/21.  She had to go back to hospital yesterday due to another seizure.  She can't drive or go back to work until she sees Dr. [name].  She states she needs appointment as soon as possible.  Please call and advise.

## 2015-02-26 NOTE — Telephone Encounter (Signed)
Spoke to patient. Scheduled appointment for 03/03/15 at 1200.

## 2015-03-03 ENCOUNTER — Encounter: Payer: Self-pay | Admitting: Neurology

## 2015-03-03 ENCOUNTER — Ambulatory Visit (INDEPENDENT_AMBULATORY_CARE_PROVIDER_SITE_OTHER): Payer: Self-pay | Admitting: Neurology

## 2015-03-03 VITALS — BP 141/87 | HR 108 | Ht 66.0 in | Wt 249.4 lb

## 2015-03-03 DIAGNOSIS — G441 Vascular headache, not elsewhere classified: Secondary | ICD-10-CM

## 2015-03-03 DIAGNOSIS — G4089 Other seizures: Secondary | ICD-10-CM

## 2015-03-03 DIAGNOSIS — F445 Conversion disorder with seizures or convulsions: Secondary | ICD-10-CM

## 2015-03-03 DIAGNOSIS — G609 Hereditary and idiopathic neuropathy, unspecified: Secondary | ICD-10-CM

## 2015-03-03 MED ORDER — CLONAZEPAM 0.5 MG PO TABS: 0.5000 mg | ORAL_TABLET | Freq: Three times a day (TID) | ORAL | Status: DC

## 2015-03-03 MED ORDER — BUSPIRONE HCL 7.5 MG PO TABS: 7.5000 mg | ORAL_TABLET | Freq: Every day | ORAL | Status: DC

## 2015-03-03 NOTE — Progress Notes (Signed)
Reason for visit: Seizures  Penny [name] is an 29 y.o. female  History of present illness:  Penny [name] is a 29 year old right-handed white female with a history of fibromyalgia type symptoms, and a documented peripheral neuropathy on nerve conduction studies. 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 The patient has had seizure-like events with posturing of the arms and legs, and lack of responsiveness. The patient has been to [facility] and she has had 2 clinical events while being video-EEG monitored. These events were associated with a normal EEG study, confirming the diagnosis of pseudoseizures. The patient has been under stress recently, as her Penny [name] has left her. She has started a new job in the [employer]. The patient began having seizures last week, and she was admitted to the hospital on 02/22/2015 after a seizure-type spell at work, she was in the hospital for 2 days. The patient was not placed on anticonvulsant medications. She has had seizures daily since coming out of the hospital, the last seizure-type event was yesterday. The Penny [name] comes with her today, she indicates that her seizure yesterday was associated with some choking sensations which is new for her. The patient comes back to the office today for an evaluation. The patient continues to have significant issues with depression and anxiety, she finds that the BuSpar that was started in the hospital helps her sleep at night. She was placed on clonazepam taking 0.5 mg 3 times daily for anxiety, this is helpful.  Past Medical History  Diagnosis Date  . Migraines   . Interstitial cystitis   . IUD FEB 2010  . Gastritis JULY 2011  . Depression   . 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 Fatty liver   . Obesity (BMI 30-39.9) 2011 228 LBS BMI 36.8  . Iron deficiency anemia 07/23/2010  . Irritable bowel syndrome 2012 DIARRHEA    JUN 2012 TTG IgA 14.9    . Elevated liver enzymes JUL 2011ALK PHOS 111-127 AST  143-267 ALT  213-321T BILI 0.6  ALB  3.7-4.06 Jun 2011 ALK PHOS 118 AST 24 ALT 42 T BILI 0.4 ALB 3.9  . Chronic daily headache   . Ovarian cyst   . PONV (postoperative nausea and vomiting)   . ADD (attention deficit disorder)   . RLQ abdominal pain 07/18/2013  . Menorrhagia 07/18/2013  . Anxiety   . Potassium (K) deficiency   . Dysmenorrhea 09/18/2013  . Stress 09/18/2013  . Patient desires pregnancy 09/18/2013  . Pregnant 12/25/2013  . Blood transfusion without reported diagnosis   . Heart murmur   . Gastric bypass status for obesity   . 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 Dysrhythmia   . Sciatica of left side 11/14/2014  . Fibromyalgia   . 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    Past Surgical History  Procedure Laterality Date  . Gab  2007    in [place]-POUCH 5 CM  . Cholecystectomy  2005    biliary dyskinesia  . Cath self every nite      for sodium bicarb injection (discontinued 2013)  . Colonoscopy  JUN 2012 ABD PN/DIARRHEA WITH PROPOFOL    NL COLON  . Upper gastrointestinal endoscopy  JULY 2011 NAUSEA-D125,V6, PH 25    Bx; GASTRITIS, POUCH-5 CM LONG  . Dilation and curettage of uterus    . Tonsillectomy    .  Gastric bypass  06/2006  . Savory dilation  06/20/2012    AGT:[id number] gastritis/Ulcer in the mid jejunum  . Tonsillectomy and adenoidectomy    . Esophagogastroduodenoscopy    . Wisdom tooth extraction    . Repair vaginal cuff N/A 07/30/2014    Procedure: REPAIR VAGINAL CUFF;  Surgeon: Mora Bellman, MD;  Location: [facility];  Service: Gynecology;  Laterality: N/A;  . Hysteroscopy w/d&c N/A 09/12/2014    Procedure: DILATATION AND CURETTAGE /HYSTEROSCOPY;  Surgeon: Jonnie Kind, MD;  Location: [facility];  Service: Gynecology;  Laterality: N/A;    Family History  Problem Relation Age of Onset  . Hemochromatosis Maternal Grandmother   . Migraines Maternal Grandmother   . Cancer  Maternal Grandmother   . Breast cancer Maternal Grandmother   . Hypertension Father   . Diabetes Father   . Coronary artery disease Father   . Migraines Paternal Grandmother   . Breast cancer Paternal Grandmother   . Cancer Penny [name]     breast  . Hemochromatosis Penny [name]   . Breast cancer Penny [name]   . Coronary artery disease Paternal Grandfather     Social history:  reports that she has been smoking Cigarettes.  She has a 3 pack-year smoking history. She has never used smokeless tobacco. She reports that she does not drink alcohol or use illicit drugs.    Allergies  Allergen Reactions  . Gabapentin Other (See Comments)    Pt states that she was unresponsive after taking this medication, but her vitals remained stable.    . Metoclopramide Hcl Anxiety and Other (See Comments)    Pt states that she felt like she was trapped in a box, and could not get out.  Pt also states that she had temporary loss of movement, weakness, and tingling.    . Codeine Nausea And Vomiting  . Zofran [Ondansetron] Other (See Comments)    Migraines   . Latex Rash  . Tape Itching and Rash    Please use paper tape    Medications:  Prior to Admission medications   Medication Sig Start Date End Date Taking? Authorizing Provider  busPIRone (BUSPAR) 7.5 MG tablet Take 1 tablet (7.5 mg total) by mouth 2 (two) times daily. Patient taking differently: Take 7.5 mg by mouth daily after supper.  02/24/15  Yes Albertine Patricia, MD  Clobetasol Prop Emollient Base 0.05 % emollient cream Apply small amount to affected area BID Patient taking differently: Apply 1 application topically 2 (two) times daily. Use for 3 weeks starting 02/17/15 02/18/15  Yes Christin Fudge, CNM  clonazePAM (KLONOPIN) 0.5 MG tablet Take 1 tablet (0.5 mg total) by mouth 2 (two) times daily as needed for anxiety. 02/24/15  Yes Albertine Patricia, MD  cyclobenzaprine (FLEXERIL) 10 MG tablet Take 1 tablet (10 mg total) by mouth 2 (two) times  daily as needed for muscle spasms. 02/25/15  Yes Davonna Belling, MD  DULoxetine (CYMBALTA) 30 MG capsule Take 1-2 capsules (30-60 mg total) by mouth 2 (two) times daily. Take 1 capsule (30 mg) every morning and 2 capsules (60 mg) every night 02/24/15  Yes Albertine Patricia, MD  ferrous sulfate 325 (65 FE) MG tablet Take 1 tablet (325 mg total) by mouth 2 (two) times daily with a meal. 02/06/15  Yes Okey Regal, PA-C  ketorolac (TORADOL) 10 MG tablet Take 1 tablet (10 mg total) by mouth every 8 (eight) hours as needed for moderate pain. 02/12/15  Yes Kathrynn Ducking, MD  promethazine [facility])  12.5 MG tablet Take 1 tablet (12.5 mg total) by mouth every 6 (six) hours as needed for nausea or vomiting. 02/24/15  Yes Starleen Arms, MD  topiramate (TOPAMAX) 100 MG tablet Take 1 tablet (100 mg total) by mouth at bedtime. 02/18/15  Yes York Spaniel, MD  zolpidem (AMBIEN) 5 MG tablet Take 1 tablet (5 mg total) by mouth at bedtime as needed for sleep. 12/26/14  Yes Babs Sciara, MD    ROS:  Out of a complete 14 system review of symptoms, the patient complains only of the following symptoms, and all other reviewed systems are negative.  Appetite change, fatigue Ringing in the ears Loss of vision, blurred vision Palpitations of the heart Excessive thirst Restless legs, insomnia, daytime sleepiness, shift work Joint pain, achy muscles, muscle cramps, walking difficulties, neck pain, neck stiffness Anemia Memory loss, dizziness, headache, numbness, seizures, speech difficulty, weakness  Blood pressure 141/87, pulse 108, height 5\' 6"  (1.676 m), weight 249 lb 6.4 oz (113.127 kg), last menstrual period 02/16/2015, not currently breastfeeding.  Physical Exam  General: The patient is alert and cooperative at the time of the examination. The patient is markedly obese.  Neuromuscular: The patient has excellent range of motion movement of the cervical and lumbosacral spine.  Skin: No significant  peripheral edema is noted.   Neurologic Exam  Mental status: The patient is alert and oriented x 3 at the time of the examination. The patient has apparent normal recent and remote memory, with an apparently normal attention span and concentration ability.   Cranial nerves: Facial symmetry is present. Speech is normal, no aphasia or dysarthria is noted. Extraocular movements are full. Visual fields are full. The patient has decreased pinprick sensation on the left base relative to the right, splits the midline with vibration sensation on the forehead, decreased on the left.  Motor: The patient has good strength in all 4 extremities.  Sensory examination: Soft touch sensation is reduced on the left arm and left leg relative to the right. The patient has decreased vibration sensation on the left arm and left leg.  Coordination: The patient has good finger-nose-finger and heel-to-shin bilaterally.  Gait and station: The patient has a normal gait. Tandem gait is normal. Romberg is negative. No drift is seen.  Reflexes: Deep tendon reflexes are symmetric.   Assessment/Plan:  1. Pseudoseizures with recent recurrence  2. Fibromyalgia syndrome  3. Left hemisensory deficit, nonorganic  4. Headache  5. Depression, anxiety  6. Peripheral neuropathy  The patient is having a recurrence of her pseudoseizure-type events associated with increased stress from the separation with her Penny [name]. The patient has already undergone a full workup with video EEG monitoring confirming the presence of pseudoseizures. The patient is on Topamax currently for headaches. She will continue on this medication. Clonazepam was started the hospital, we will continue this taking 0.5 mg 3 times daily. The patient is on Cymbalta for depression and for fibromyalgia. She was placed on low-dose BuSpar taking 7.5 mg 3 times daily, but she finds that it makes her too drowsy during the day, but it helps her sleep at night. She  will take it only one time at nighttime. She will follow-up in May 2016 for her next revisit. I have written a note today allowing the patient to return to work with no restrictions, she has no restrictions with driving.  June 2016 MD 03/03/2015 7:26 PM  [facility] [address] [place], [place]  Phone [phone] Fax [phone]  

## 2015-03-03 NOTE — Patient Instructions (Signed)

## 2015-03-04 ENCOUNTER — Inpatient Hospital Stay: Payer: Self-pay | Admitting: Family Medicine

## 2015-03-17 ENCOUNTER — Telehealth: Payer: Self-pay | Admitting: Neurology

## 2015-03-17 ENCOUNTER — Other Ambulatory Visit: Payer: Self-pay | Admitting: Neurology

## 2015-03-17 NOTE — Telephone Encounter (Signed)
Pt is calling requesting a refill on cyclobenzaprine (FLEXERIL) 10 MG tablet. Please send to [facility].  Please advise.

## 2015-03-17 NOTE — Telephone Encounter (Signed)
Per phone note on 02/19

## 2015-03-19 ENCOUNTER — Emergency Department (HOSPITAL_COMMUNITY)
Admission: EM | Admit: 2015-03-19 | Discharge: 2015-03-19 | Disposition: A | Discharge status: Home / Self Care | Payer: 59 | Attending: Emergency Medicine | Admitting: Emergency Medicine

## 2015-03-19 ENCOUNTER — Emergency Department (HOSPITAL_COMMUNITY): Payer: 59

## 2015-03-19 ENCOUNTER — Encounter (HOSPITAL_COMMUNITY): Payer: Self-pay | Admitting: Emergency Medicine

## 2015-03-19 ENCOUNTER — Other Ambulatory Visit: Payer: Self-pay

## 2015-03-19 DIAGNOSIS — Z720 Tobacco use: Secondary | ICD-10-CM | POA: Insufficient documentation

## 2015-03-19 DIAGNOSIS — R011 Cardiac murmur, unspecified: Secondary | ICD-10-CM | POA: Diagnosis not present

## 2015-03-19 DIAGNOSIS — G609 Hereditary and idiopathic neuropathy, unspecified: Secondary | ICD-10-CM | POA: Insufficient documentation

## 2015-03-19 DIAGNOSIS — R109 Unspecified abdominal pain: Secondary | ICD-10-CM | POA: Diagnosis not present

## 2015-03-19 DIAGNOSIS — Z8742 Personal history of other diseases of the female genital tract: Secondary | ICD-10-CM | POA: Insufficient documentation

## 2015-03-19 DIAGNOSIS — D509 Iron deficiency anemia, unspecified: Secondary | ICD-10-CM | POA: Diagnosis not present

## 2015-03-19 DIAGNOSIS — Z3202 Encounter for pregnancy test, result negative: Secondary | ICD-10-CM | POA: Diagnosis not present

## 2015-03-19 DIAGNOSIS — F419 Anxiety disorder, unspecified: Secondary | ICD-10-CM | POA: Insufficient documentation

## 2015-03-19 DIAGNOSIS — Z79899 Other long term (current) drug therapy: Secondary | ICD-10-CM | POA: Insufficient documentation

## 2015-03-19 DIAGNOSIS — R1010 Upper abdominal pain, unspecified: Secondary | ICD-10-CM

## 2015-03-19 DIAGNOSIS — F329 Major depressive disorder, single episode, unspecified: Secondary | ICD-10-CM | POA: Insufficient documentation

## 2015-03-19 DIAGNOSIS — R569 Unspecified convulsions: Secondary | ICD-10-CM | POA: Diagnosis present

## 2015-03-19 DIAGNOSIS — M797 Fibromyalgia: Secondary | ICD-10-CM | POA: Insufficient documentation

## 2015-03-19 DIAGNOSIS — G43909 Migraine, unspecified, not intractable, without status migrainosus: Secondary | ICD-10-CM | POA: Diagnosis not present

## 2015-03-19 DIAGNOSIS — Z8639 Personal history of other endocrine, nutritional and metabolic disease: Secondary | ICD-10-CM | POA: Diagnosis not present

## 2015-03-19 DIAGNOSIS — Z9884 Bariatric surgery status: Secondary | ICD-10-CM | POA: Diagnosis not present

## 2015-03-19 DIAGNOSIS — Z8719 Personal history of other diseases of the digestive system: Secondary | ICD-10-CM | POA: Diagnosis not present

## 2015-03-19 DIAGNOSIS — Z91040 Latex allergy status: Secondary | ICD-10-CM | POA: Insufficient documentation

## 2015-03-19 DIAGNOSIS — G40909 Epilepsy, unspecified, not intractable, without status epilepticus: Secondary | ICD-10-CM | POA: Diagnosis not present

## 2015-03-19 DIAGNOSIS — F445 Conversion disorder with seizures or convulsions: Secondary | ICD-10-CM

## 2015-03-19 LAB — CBC WITH DIFFERENTIAL/PLATELET
BASOS ABS: 0.1 10*3/uL (ref 0.0–0.1)
Basophils Relative: 1 % (ref 0–1)
Eosinophils Absolute: 0.1 10*3/uL (ref 0.0–0.7)
Eosinophils Relative: 2 % (ref 0–5)
HCT: 29.3 % — ABNORMAL LOW (ref 36.0–46.0)
Hemoglobin: 9 g/dL — ABNORMAL LOW (ref 12.0–15.0)
Lymphocytes Relative: 47 % — ABNORMAL HIGH (ref 12–46)
Lymphs Abs: 2.8 10*3/uL (ref 0.7–4.0)
MCH: 22.8 pg — AB (ref 26.0–34.0)
MCHC: 30.7 g/dL (ref 30.0–36.0)
MCV: 74.4 fL — ABNORMAL LOW (ref 78.0–100.0)
MONO ABS: 0.7 10*3/uL (ref 0.1–1.0)
Monocytes Relative: 11 % (ref 3–12)
NEUTROS PCT: 39 % — AB (ref 43–77)
Neutro Abs: 2.3 10*3/uL (ref 1.7–7.7)
PLATELETS: 383 10*3/uL (ref 150–400)
RBC: 3.94 MIL/uL (ref 3.87–5.11)
RDW: 17.3 % — AB (ref 11.5–15.5)
WBC: 6 10*3/uL (ref 4.0–10.5)

## 2015-03-19 LAB — COMPREHENSIVE METABOLIC PANEL
ALK PHOS: 92 U/L (ref 39–117)
ALT: 17 U/L (ref 0–35)
ANION GAP: 14 (ref 5–15)
AST: 30 U/L (ref 0–37)
Albumin: 4.2 g/dL (ref 3.5–5.2)
BUN: 11 mg/dL (ref 6–23)
CO2: 19 mmol/L (ref 19–32)
Calcium: 9.3 mg/dL (ref 8.4–10.5)
Chloride: 106 mmol/L (ref 96–112)
Creatinine, Ser: 0.83 mg/dL (ref 0.50–1.10)
GFR calc non Af Amer: >90 mL/min (ref >90)
GLUCOSE: 121 mg/dL — AB (ref 70–99)
Potassium: 3.8 mmol/L (ref 3.5–5.1)
Sodium: 139 mmol/L (ref 135–145)
Total Bilirubin: 0.6 mg/dL (ref 0.3–1.2)
Total Protein: 7 g/dL (ref 6.0–8.3)

## 2015-03-19 LAB — CBG MONITORING, ED: Glucose-Capillary: 129 mg/dL — ABNORMAL HIGH (ref 70–99)

## 2015-03-19 LAB — POC URINE PREG, ED: Preg Test, Ur: NEGATIVE

## 2015-03-19 LAB — LIPASE, BLOOD: LIPASE: 25 U/L (ref 11–59)

## 2015-03-19 MED ORDER — IOHEXOL 300 MG/ML  SOLN
100.0000 mL | Freq: Once | INTRAMUSCULAR | Status: AC | PRN
  Administered 2015-03-19: 100 mL via INTRAVENOUS

## 2015-03-19 MED ORDER — HYDROMORPHONE HCL 1 MG/ML IJ SOLN
1.0000 mg | Freq: Once | INTRAMUSCULAR | Status: AC
  Administered 2015-03-19: 1 mg via INTRAVENOUS
  Filled 2015-03-19: qty 1

## 2015-03-19 MED ORDER — PROMETHAZINE HCL 25 MG PO TABS: 25.0000 mg | ORAL_TABLET | Freq: Four times a day (QID) | ORAL | Status: DC | PRN

## 2015-03-19 MED ORDER — PANTOPRAZOLE SODIUM 40 MG PO TBEC
40.0000 mg | DELAYED_RELEASE_TABLET | Freq: Every day | ORAL | Status: DC
  Administered 2015-03-19: 40 mg via ORAL
  Filled 2015-03-19: qty 1

## 2015-03-19 MED ORDER — KETOROLAC TROMETHAMINE 30 MG/ML IJ SOLN
30.0000 mg | Freq: Once | INTRAMUSCULAR | Status: AC
  Administered 2015-03-19: 30 mg via INTRAVENOUS
  Filled 2015-03-19: qty 1

## 2015-03-19 MED ORDER — PROMETHAZINE HCL 25 MG/ML IJ SOLN
12.5000 mg | Freq: Once | INTRAMUSCULAR | Status: AC
  Administered 2015-03-19: 12.5 mg via INTRAVENOUS
  Filled 2015-03-19: qty 1

## 2015-03-19 MED ORDER — GI COCKTAIL ~~LOC~~
30.0000 mL | Freq: Once | ORAL | Status: AC
  Administered 2015-03-19: 30 mL via ORAL
  Filled 2015-03-19: qty 30

## 2015-03-19 MED ORDER — SUCRALFATE 1 G PO TABS
1.0000 g | ORAL_TABLET | Freq: Once | ORAL | Status: AC
  Administered 2015-03-19: 1 g via ORAL
  Filled 2015-03-19: qty 1

## 2015-03-19 MED ORDER — ESOMEPRAZOLE MAGNESIUM 40 MG PO CPDR: 40.0000 mg | DELAYED_RELEASE_CAPSULE | Freq: Every day | ORAL | Status: DC

## 2015-03-19 MED ORDER — HYDROCODONE-ACETAMINOPHEN 5-325 MG PO TABS: 1.0000 | ORAL_TABLET | Freq: Four times a day (QID) | ORAL | Status: DC | PRN

## 2015-03-19 MED ORDER — SODIUM CHLORIDE 0.9 % IV BOLUS (SEPSIS)
1000.0000 mL | Freq: Once | INTRAVENOUS | Status: AC
  Administered 2015-03-19: 1000 mL via INTRAVENOUS

## 2015-03-19 MED ORDER — LORAZEPAM 2 MG/ML IJ SOLN
1.0000 mg | Freq: Once | INTRAMUSCULAR | Status: AC
  Administered 2015-03-19: 1 mg via INTRAVENOUS
  Filled 2015-03-19: qty 1

## 2015-03-19 MED ORDER — FENTANYL CITRATE 0.05 MG/ML IJ SOLN
50.0000 ug | INTRAMUSCULAR | Status: DC | PRN
  Administered 2015-03-19: 50 ug via INTRAVENOUS
  Filled 2015-03-19: qty 2

## 2015-03-19 MED ORDER — PROMETHAZINE HCL 25 MG/ML IJ SOLN
25.0000 mg | Freq: Once | INTRAMUSCULAR | Status: AC
  Administered 2015-03-19: 25 mg via INTRAMUSCULAR
  Filled 2015-03-19: qty 1

## 2015-03-19 MED ORDER — SUCRALFATE 1 G PO TABS: 1.0000 g | ORAL_TABLET | Freq: Four times a day (QID) | ORAL | Status: DC

## 2015-03-19 MED ORDER — FENTANYL CITRATE 0.05 MG/ML IJ SOLN
50.0000 ug | Freq: Once | INTRAMUSCULAR | Status: AC
  Administered 2015-03-19: 50 ug via INTRAVENOUS
  Filled 2015-03-19: qty 2

## 2015-03-19 NOTE — ED Notes (Signed)
CT called to inquire about when pt can go to CT scan.

## 2015-03-19 NOTE — ED Notes (Signed)
Dr.Walden at the bedside.  

## 2015-03-19 NOTE — ED Provider Notes (Signed)
CSN: [id number]     Arrival date & time 03/19/15  0248 History  This chart was scribed for Penny Bucy, MD by Rayfield Citizen, ED Scribe. This patient was seen in room B17C/B17C and the patient's care was started at 2:45 AM.    No chief complaint on file.  Patient is a 29 y.o. female presenting with abdominal pain. The history is provided by the patient. No language interpreter was used.  Abdominal Pain Pain location:  Epigastric Pain quality: sharp   Pain radiates to:  Back Pain severity:  Moderate Onset quality:  Gradual Timing:  Unable to specify Progression:  Unable to specify Chronicity:  New Associated symptoms: no diarrhea, no fever and no vomiting      HPI Comments: Penny [name] is a 29 y.o. female with past medical history of non-epileptic seizures who presents to the Emergency Department for seizure. Seizure was witnessed in the break room by staff, lasting approximately 45 seconds. Per patient, she has recently experienced sharp, epigastric abdominal pain radiating to her back. She denies diarrhea, vomiting, fever.   Patient recently experienced a complicated pregnancy (induced 5 weeks early in 07/2014 for pre-eclampsia); per records, patient's seizures began prior to pregnancy and she is not followed by OB for this issue. She is currently taking Klonopin.   She denies being able to take her meds for past 3 days due to severe epigastric pain. Sharp, radiates to her back.  Past Medical History  Diagnosis Date  . Migraines   . Interstitial cystitis   . IUD FEB 2010  . Gastritis JULY 2011  . Depression   . 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 Fatty liver   . Obesity (BMI 30-39.9) 2011 228 LBS BMI 36.8  . Iron deficiency anemia 07/23/2010  . Irritable bowel syndrome 2012 DIARRHEA    JUN 2012 TTG IgA 14.9  . Elevated liver enzymes JUL 2011ALK PHOS 111-127 AST  143-267 ALT  213-321T BILI 0.6  ALB  3.7-4.06 Jun 2011 ALK PHOS 118 AST 24 ALT 42 T BILI 0.4 ALB 3.9  .  Chronic daily headache   . Ovarian cyst   . PONV (postoperative nausea and vomiting)   . ADD (attention deficit disorder)   . RLQ abdominal pain 07/18/2013  . Menorrhagia 07/18/2013  . Anxiety   . Potassium (K) deficiency   . Dysmenorrhea 09/18/2013  . Stress 09/18/2013  . Patient desires pregnancy 09/18/2013  . Pregnant 12/25/2013  . Blood transfusion without reported diagnosis   . Heart murmur   . Gastric bypass status for obesity   . 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 Dysrhythmia   . Sciatica of left side 11/14/2014  . Fibromyalgia   . 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 Gab  2007    in [place]-POUCH 5 CM  . Cholecystectomy  2005    biliary dyskinesia  . Cath self every nite      for sodium bicarb injection (discontinued 2013)  . Colonoscopy  JUN 2012 ABD PN/DIARRHEA WITH PROPOFOL    NL COLON  . Upper gastrointestinal endoscopy  JULY 2011 NAUSEA-D125,V6, PH 25    Bx; GASTRITIS, POUCH-5 CM LONG  . Dilation and curettage of uterus    . Tonsillectomy    . Gastric bypass  06/2006  . Savory dilation  06/20/2012    HYI:FOYD gastritis/Ulcer in the mid jejunum  .  Tonsillectomy and adenoidectomy    . Esophagogastroduodenoscopy    . Wisdom tooth extraction    . Repair vaginal cuff N/A 07/30/2014    Procedure: REPAIR VAGINAL CUFF;  Surgeon: Mora Bellman, MD;  Location: [facility];  Service: Gynecology;  Laterality: N/A;  . Hysteroscopy w/d&c N/A 09/12/2014    Procedure: DILATATION AND CURETTAGE /HYSTEROSCOPY;  Surgeon: Jonnie Kind, MD;  Location: [facility];  Service: Gynecology;  Laterality: N/A;   Family History  Problem Relation Age of Onset  . Hemochromatosis Maternal Grandmother   . Migraines Maternal Grandmother   . Cancer Maternal Grandmother   . Breast cancer Maternal Grandmother   . Hypertension Father   . Diabetes Father   . Coronary artery disease Father   . Migraines Paternal  Grandmother   . Breast cancer Paternal Grandmother   . Cancer Mother     breast  . Hemochromatosis Mother   . Breast cancer Mother   . Coronary artery disease Paternal Grandfather    History  Substance Use Topics  . Smoking status: Current Every Day Smoker -- 0.50 packs/day for 6 years    Types: Cigarettes  . Smokeless tobacco: Never Used  . Alcohol Use: No   OB History    Gravida Para Term Preterm AB TAB SAB Ectopic Multiple Living   _0 Review of Systems  Constitutional: Negative for fever.  Gastrointestinal: Positive for abdominal pain. Negative for vomiting and diarrhea.  Neurological: Positive for seizures.  All other systems reviewed and are negative.  Allergies  Gabapentin; Metoclopramide hcl; Codeine; Zofran; Latex; and Tape  Home Medications   Prior to Admission medications   Medication Sig Start Date End Date Taking? Authorizing Provider  busPIRone (BUSPAR) 7.5 MG tablet Take 1 tablet (7.5 mg total) by mouth at bedtime. 03/03/15   Kathrynn Ducking, MD  Clobetasol Prop Emollient Base 0.05 % emollient cream Apply small amount to affected area BID Patient taking differently: Apply 1 application topically 2 (two) times daily. Use for 3 weeks starting 02/17/15 02/18/15   Christin Fudge, CNM  clonazePAM (KLONOPIN) 0.5 MG tablet Take 1 tablet (0.5 mg total) by mouth 3 (three) times daily. 03/03/15   Kathrynn Ducking, MD  cyclobenzaprine (FLEXERIL) 5 MG tablet TAKE 1 TABLET BY MOUTH EVERY EIGHT HOURS AS NEEDED FOR MUSCLE SPASMS. 03/17/15   Kathrynn Ducking, MD  DULoxetine (CYMBALTA) 30 MG capsule Take 1-2 capsules (30-60 mg total) by mouth 2 (two) times daily. Take 1 capsule (30 mg) every morning and 2 capsules (60 mg) every night 02/24/15   Albertine Patricia, MD  ferrous sulfate 325 (65 FE) MG tablet Take 1 tablet (325 mg total) by mouth 2 (two) times daily with a meal. 02/06/15   Okey Regal, PA-C  ketorolac (TORADOL) 10 MG tablet Take 1 tablet (10  mg total) by mouth every 8 (eight) hours as needed for moderate pain. 02/12/15   Kathrynn Ducking, MD  promethazine (PHENERGAN) 12.5 MG tablet Take 1 tablet (12.5 mg total) by mouth every 6 (six) hours as needed for nausea or vomiting. 02/24/15   Albertine Patricia, MD  topiramate (TOPAMAX) 100 MG tablet Take 1 tablet (100 mg total) by mouth at bedtime. 02/18/15   Kathrynn Ducking, MD  zolpidem (AMBIEN) 5 MG tablet Take 1 tablet (5 mg total) by mouth at bedtime as needed for sleep. 12/26/14   Kathyrn Drown, MD   LMP 02/16/2015  Physical Exam  Constitutional: She is oriented to person, place, and time. She appears well-developed and well-nourished. No distress.  HENT:  Head: Normocephalic and atraumatic.  Mouth/Throat: Oropharynx is clear and moist.  Eyes: EOM are normal. Pupils are equal, round, and reactive to light.  Neck: Normal range of motion. Neck supple.  Cardiovascular: Normal rate and regular rhythm.  Exam reveals no friction rub.   No murmur heard. Pulmonary/Chest: Effort normal and breath sounds normal. No respiratory distress. She has no wheezes. She has no rales.  Abdominal: Soft. She exhibits no distension. There is no tenderness. There is no rebound.  Musculoskeletal: Normal range of motion. She exhibits no edema.  Neurological: She is alert and oriented to person, place, and time.  Skin: No rash noted. She is not diaphoretic.  Nursing note and vitals reviewed.   ED Course  Procedures   DIAGNOSTIC STUDIES: Oxygen Saturation is 99% on RA, normal by my interpretation.    COORDINATION OF CARE: 2:50 AM Discussed treatment plan with pt at bedside and pt agreed to plan.   Labs Review Labs Reviewed - No data to display  Imaging Review Ct Abdomen Pelvis W Contrast  03/19/2015   CLINICAL DATA:  Upper abdominal pain with nausea and vomiting for 3 days  EXAM: CT ABDOMEN AND PELVIS WITH CONTRAST  TECHNIQUE: Multidetector CT imaging of the abdomen and pelvis was performed using the  standard protocol following bolus administration of intravenous contrast.  CONTRAST:  199mL OMNIPAQUE IOHEXOL 300 MG/ML  SOLN  COMPARISON:  09/03/2014  FINDINGS: BODY WALL: No contributory findings.  LOWER CHEST: No contributory findings.  ABDOMEN/PELVIS:  Liver: No focal abnormality.  Biliary: Cholecystectomy with chronic mild intra and extrahepatic biliary dilatation, likely reservoir effect given stability.  Pancreas: Unremarkable.  Spleen: Unremarkable.  Adrenals: Unremarkable.  Kidneys and ureters: No hydronephrosis or stone.  Bladder: Unremarkable.  Reproductive: Interval normalization of the uterus. There is a simple appearing 4 cm cyst in the right ovary. Given development since September 2015 this is consistent with a benign, likely physiologic process.  Bowel: Roux-en-Y gastric bypass. No evidence of gastrogastric fistula, marginal ulcer, or obstruction. Normal appendix.  Retroperitoneum: No mass or adenopathy.  Peritoneum: No ascites or pneumoperitoneum.  Vascular: No acute abnormality.  OSSEOUS: No acute abnormalities.  IMPRESSION: 1. No acute findings. 2. 4 cm right ovarian cyst. 3. Chronic and postoperative changes are described above.   Electronically Signed   By: Monte Fantasia M.D.   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 She was working her shift and had a seizure in the break room. She had another one or 2 while here. She hasn't been able take her Klonopin for 3 days due to sharp epigastric back pain. She is under a lot of stress recently because her husband left her. She resides or her neurologist 2 weeks ago, she was able to come back to work without restrictions per the neurologist's note. Here having epigastric pain. Denies any alleviating or exacerbating factors. No vomiting or diarrhea. Ativan up admits given with some relief.  We'll CT her abdomen to determine cause of her pain. CT negative here.  Will give GI referral, resource guide, and have social work speak with her since she doesn't have insurance currently. Patient stable for discharge.   I personally performed the services described  in this documentation, which was scribed in my presence. The recorded information has been reviewed and is accurate.      Penny Bucy, MD 03/19/15 [phone]

## 2015-03-19 NOTE — ED Notes (Signed)
Per triage paper charting, pt had witnessed syncopal episode lasting around 30 seconds, CBG 129. Pt has hx of seizures, pt c/o abdominal pain X3 days, c/o vomiting, denies diarrhea, denies fever.

## 2015-03-19 NOTE — ED Notes (Signed)
1 mg of dilaudid adm.

## 2015-03-19 NOTE — ED Notes (Signed)
Dr. Mingo Amber back at the bedside with mother, pt and this RN

## 2015-03-19 NOTE — ED Notes (Signed)
1 mg of Ativan administered.

## 2015-03-19 NOTE — Discharge Instructions (Signed)
Abdominal Pain, Women °Abdominal (stomach, pelvic, or belly) pain can be caused by many things. It is important to tell your doctor: °· The location of the pain. °· Does it come and go or is it present all the time? °· Are there things that start the pain (eating certain foods, exercise)? °· Are there other symptoms associated with the pain (fever, nausea, vomiting, diarrhea)? °All of this is helpful to know when trying to find the cause of the pain. °CAUSES  °· Stomach: virus or bacteria infection, or ulcer. °· Intestine: appendicitis (inflamed appendix), regional ileitis (Crohn's disease), ulcerative colitis (inflamed colon), irritable bowel syndrome, diverticulitis (inflamed diverticulum of the colon), or cancer of the stomach or intestine. °· Gallbladder disease or stones in the gallbladder. °· Kidney disease, kidney stones, or infection. °· Pancreas infection or cancer. °· Fibromyalgia (pain disorder). °· Diseases of the female organs: °¨ Uterus: fibroid (non-cancerous) tumors or infection. °¨ Fallopian tubes: infection or tubal pregnancy. °¨ Ovary: cysts or tumors. °¨ Pelvic adhesions (scar tissue). °¨ Endometriosis (uterus lining tissue growing in the pelvis and on the pelvic organs). °¨ Pelvic congestion syndrome (female organs filling up with blood just before the menstrual period). °¨ Pain with the menstrual period. °¨ Pain with ovulation (producing an egg). °¨ Pain with an IUD (intrauterine device, birth control) in the uterus. °¨ Cancer of the female organs. °· Functional pain (pain not caused by a disease, may improve without treatment). °· Psychological pain. °· Depression. °DIAGNOSIS  °Your doctor will decide the seriousness of your pain by doing an examination. °· Blood tests. °· X-rays. °· Ultrasound. °· CT scan (computed tomography, special type of X-ray). °· MRI (magnetic resonance imaging). °· Cultures, for infection. °· Barium enema (dye inserted in the large intestine, to better view it with  X-rays). °· Colonoscopy (looking in intestine with a lighted tube). °· Laparoscopy (minor surgery, looking in abdomen with a lighted tube). °· Major abdominal exploratory surgery (looking in abdomen with a large incision). °TREATMENT  °The treatment will depend on the cause of the pain.  °· Many cases can be observed and treated at home. °· Over-the-counter medicines recommended by your caregiver. °· Prescription medicine. °· Antibiotics, for infection. °· Birth control pills, for painful periods or for ovulation pain. °· Hormone treatment, for endometriosis. °· Nerve blocking injections. °· Physical therapy. °· Antidepressants. °· Counseling with a psychologist or psychiatrist. °· Minor or major surgery. °HOME CARE INSTRUCTIONS  °· Do not take laxatives, unless directed by your caregiver. °· Take over-the-counter pain medicine only if ordered by your caregiver. Do not take aspirin because it can cause an upset stomach or bleeding. °· Try a clear liquid diet (broth or water) as ordered by your caregiver. Slowly move to a bland diet, as tolerated, if the pain is related to the stomach or intestine. °· Have a thermometer and take your temperature several times a day, and record it. °· Bed rest and sleep, if it helps the pain. °· Avoid sexual intercourse, if it causes pain. °· Avoid stressful situations. °· Keep your follow-up appointments and tests, as your caregiver orders. °· If the pain does not go away with medicine or surgery, you may try: °¨ Acupuncture. °¨ Relaxation exercises (yoga, meditation). °¨ Group therapy. °¨ Counseling. °SEEK MEDICAL CARE IF:  °· You notice certain foods cause stomach pain. °· Your home care treatment is not helping your pain. °· You need stronger pain medicine. °· You want your IUD removed. °· You feel faint or   lightheaded.  You develop nausea and vomiting.  You develop a rash.  You are having side effects or an allergy to your medicine. SEEK IMMEDIATE MEDICAL CARE IF:   Your  pain does not go away or gets worse.  You have a fever.  Your pain is felt only in portions of the abdomen. The right side could possibly be appendicitis. The left lower portion of the abdomen could be colitis or diverticulitis.  You are passing blood in your stools (bright red or black tarry stools, with or without vomiting).  You have blood in your urine.  You develop chills, with or without a fever.  You pass out. MAKE SURE YOU:   Understand these instructions.  Will watch your condition.  Will get help right away if you are not doing well or get worse. Document Released: 09/19/2007 Document Revised: 04/08/2014 Document Reviewed: 10/09/2009 [facility] Patient Information 2015 [place], [place]. This information is not intended to replace advice given to you by your health care provider. Make sure you discuss any questions you have with your health care provider.  Emergency Department Resource Guide 1) Find a Doctor and Pay Out of Pocket Although you won't have to find out who is covered by your insurance plan, it is a good idea to ask around and get recommendations. You will then need to call the office and see if the doctor you have chosen will accept you as a new patient and what types of options they offer for patients who are self-pay. Some doctors offer discounts or will set up payment plans for their patients who do not have insurance, but you will need to ask so you aren't surprised when you get to your appointment.  2) Contact Your Local Health Department Not all health departments have doctors that can see patients for sick visits, but many do, so it is worth a call to see if yours does. If you don't know where your local health department is, you can check in your phone book. The CDC also has a tool to help you locate your state's health department, and many state websites also have listings of all of their local health departments.  3) Find a [facility] If your illness is  not likely to be very severe or complicated, you may want to try a walk in clinic. These are popping up all over the country in pharmacies, drugstores, and shopping centers. They're usually staffed by nurse practitioners or physician assistants that have been trained to treat common illnesses and complaints. They're usually fairly quick and inexpensive. However, if you have serious medical issues or chronic medical problems, these are probably not your best option.  No Primary Care Doctor: - Call Health Connect at  [phone] - they can help you locate a primary care doctor that  accepts your insurance, provides certain services, etc. - Physician Referral Service- [phone]  Chronic Pain Problems: Organization         Address  Phone   Notes  [facility]  [phone] Patients need to be referred by their primary care doctor.   Medication Assistance: Organization         Address  Phone   Notes  [facility] Medication [facility] [address] [place], [place] [address] [phone] --Must be a resident of [facility] -- Must have NO insurance coverage whatsoever (no Medicaid/ Medicare, etc.) -- The pt. MUST have a primary care doctor that directs their care regularly  and follows them in the community   MedAssist  [phone]   [employer]  [phone]    Agencies that provide inexpensive medical care: Organization         Address  Phone   Notes  [place]  [phone]   [facility]    [phone]   [facility] [place], [place] [address] [phone]   [place]. [address] [place] [phone]   Planned Parenthood    [phone]   [facility]    [phone]   [place] and [place] [facility] [facility] [address], [place] Phone:  [phone], Fax:  [phone] Hours of Operation:  9 am - 6  pm, M-F.  Also accepts Medicaid/Medicare and self-pay.  [facility] for [place] [address], [address], [place] Phone: [phone], Fax: [phone]. Hours of Operation:  8:30 am - 5:30 pm, M-F.  Also accepts Medicaid and self-pay.  [facility] [address] [place] Phone: [phone]   [place], [place], [place] [phone], Ext. [phone] Mondays & Thursdays: 7-9 AM.  First 15 patients are seen on a first come, first serve basis.    [place] Providers:  Organization         Address  Phone   Notes  [facility] [address], [place] [phone] Also accepts self-pay patients.  [facility] [address], [place]  [phone]   [place], [address], [place] [phone]   [facility] [address], [place] [phone]   Lucianne Lei [address], [place]   [phone] Only accepts [place] Access [place] patients after they have their name applied to their card.   Self-Pay (no insurance) in [facility]:  Organization         Address  Phone   Notes  Sickle Cell Patients, [facility] [place] [phone]   [facility] [place] [phone]   [facility] [place]  [place], [place], [place] [phone]   [facility]/Dr. Osei-Bonsu  [address] [place] or [address], [place] [phone] Phone number for both [place] and [place] locations is the same.  Urgent Medical and [facility] [address], [place] [phone]   [facility] [address], [place] or [address] [phone]   [facility] [address] [place] [phone], phone; [phone], fax Sees patients 1st and 3rd Saturday of every month.  Must not qualify for public or private insurance (i.e. Medicaid, Medicare, [place] Health Choice, Veterans' Benefits)  Household income should be no more than 200% of the poverty level The clinic cannot treat you if you are pregnant or think you are pregnant  Sexually transmitted diseases are not treated at the clinic.    Dental Care: Organization         Address  Phone  Notes  [facility] Department of [facility] [address] [place], [place] [phone] Accepts children up to age 65 who  are enrolled in Medicaid or [place] Health Choice; pregnant women with a Medicaid card; and children who have applied for Medicaid or [place] Health Choice, but were declined, whose parents can pay a reduced fee at time of service.  [facility] of [facility]  [address], [place] [phone] Accepts children up to age 13 who are enrolled in [place] or [place]; pregnant women with a Medicaid card; and children who have applied for Medicaid or [place] Health Choice, but were declined, whose parents can pay a reduced fee at time of service.  [place] Adult Dental Access PROGRAM  [place] [phone] Patients are seen by appointment only. Walk-ins are not accepted. [place] will see patients 36 years of age and older. Monday - Tuesday (8am-5pm) Most Wednesdays (8:30-5pm) $30 per visit, cash only  [facility] Adult Dental Access PROGRAM  [address], [facility] [phone] Patients are seen by appointment only. Walk-ins are not accepted. [place] will see patients 37 years of age and older. One Wednesday Evening (Monthly: Volunteer Based).  $30 per visit, cash only  [place]  [phone] for adults; Children under age 40, call Graduate Pediatric Dentistry at [phone]. Children aged 61-14, please call [phone] to request a pediatric application.  Dental services are provided in all areas of dental care including fillings, crowns and bridges, complete and partial dentures, implants, gum treatment, root canals, and extractions. Preventive care is also provided. Treatment is provided to both adults and children. Patients are selected via a lottery and there is often a waiting list.   [facility] [address], [place]  [phone] [email or web address]   [facility] [address] [place], [place] [phone], Ext. [phone] Second and Fourth Thursday of each month, opens at 6:30 AM; Clinic ends at 9 AM.  Patients are seen on a first-come first-served basis, and a limited number are seen during each clinic.   [facility]  [address] Hillard Danker [place], [place] [phone]   Eligibility Requirements You must have lived in [place], [place], or [place] counties for at least the last three months.   You cannot be eligible for state or federal sponsored [employer], including [employer], [place], or [employer].   You generally cannot be eligible for healthcare insurance through your employer.    How to apply: Eligibility screenings are held every Tuesday and Wednesday afternoon from 1:00 pm until 4:00 pm. You do not need an appointment for the interview!  [facility] [address], [place], [place]   [place]  [place] Department  [place]  [phone]    Behavioral Health Resources in the Community: Intensive Outpatient Programs Organization         Address  Phone  Notes  [place]. [address], [place], [place] [phone]   [facility] Outpatient [address], [name], [place]   ADS: Alcohol & Drug Svcs  [address] [place], [place]   [place] [address]  [place], [place] or [phone]   Substance Abuse Resources Organization         Address  Phone  Notes  Alcohol and Drug Services  [place]  [phone]  [facility] [place]   Chinita Pester  [phone]   Residential & Outpatient Substance Abuse Program  [phone]   Psychological Services Organization         Address  Phone  Notes  [facility] [place]  [place]  [phone]   [place] [address], [place] or [phone]    Mobile Crisis Teams Organization         Address  Phone  Notes  [facility], Mobile Crisis Care Unit  [phone]   [facility] Psychotherapeutic Services  [address] [place], [place]   [place] [address] [place] [phone]    Self-Help/Support Groups Organization         Address  Phone             Notes  [place]. of [place] - variety of support groups  [place] Call for more information  Narcotics Anonymous (NA), [facility] [address], [employer] [place]  2 meetings at this location   Special educational needs teacher         Address  Phone  Notes  [facility] Residential Treatment [place],    [place]  [phone]   [facility]  [address] [place] [address], [place], [place]   [place], [place] [phone] Admissions: 8am-3pm M-F  Incentives Substance [place] [address]    [place], [place] [phone]   [facility] [address] [place], [place], [place]   [facility] [address]  [place], [place]   [facility] Programs - Intensive Outpatient [address], [place], [place]   [facility] ([place].,  [place], [place] [phone] or [phone]   [facility] Services ([facility]) [address], [place], [place] Accepts Medicaid  [facility] [place] [address]  [place] 1-3678461523 Substance Abuse/Addiction Treatment   [facility] Organization         Address  Phone  Notes  [facility]  [phone]   Domenic Schwab, PhD [address] Arlis Porta [place], [place]   [phone] or [phone]   [place], [place] [phone]   [facility] [address] [place], [place] [phone] Insurance/Medicaid/sponsorship through [facility] and Families [address]                                    [place], [place] [phone] [place] [address], [place] [phone]    Dr. Adele Schilder  [phone]   [facility] Dept. 1) [address], [place] 2) [place] 3)  [place] [address], [place] [phone]  [phone]   [address] [place] [phone] or [phone] (After Hours)

## 2015-03-19 NOTE — Progress Notes (Signed)
LCSW met with patient per consult from RN/MD. 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 Patient reports she was in the break room and felt it coming on and fell. She was brought straight back to ED for evaluation.  Patient reports increased stressors including new infant (7 months), husband recently left patient, work stress/new job as a secretary, and increased pain/depression. Patient has been receiving care at [facility] for medication and open to a therapy appointment in which LCSW completed. Patient is also an employee at , thus given HR numbers to understand and address benefits for herself through [facility]. Lastly patient given free clinic information in [place] in effort to decrease number of ED visits.  Patient very tearful reporting high level of stress in the home with husband leaving. Patient reports good support from friends and mother who are in the home. Patient has a 28 year old and a 7 month old. No suspicion of abuse or neglect. CPS previously opened per patient has been closed. Patient fearful of job being lost as she has medical problems to which she was encouraged to speak with director and devise a plan of action in effort to keep job, but be able to work.  Patient has her own transportation here and will be able to drive home.   It appears patient has high anxiety level and may be having panic attacks more so than seizures as seizure workups have all been negative. Patient in agreement.  Patient shows poor emotional regulation and coping skills at this time.  Again therapy recommended as she does not meet criteria for inpatient at [facility].  She is in agreement and appointment made.    LCSW, MSW Clinical Social Work: Emergency Room [phone] 

## 2015-03-19 NOTE — ED Notes (Signed)
Pt sts she hasn't been able to eat or drink anything in the last 3 days.

## 2015-03-19 NOTE — ED Notes (Signed)
Jarrett Soho, CSW at the bedside to speak with the patient.

## 2015-03-20 ENCOUNTER — Emergency Department (HOSPITAL_COMMUNITY): Payer: 59

## 2015-03-20 ENCOUNTER — Emergency Department (HOSPITAL_COMMUNITY)
Admission: EM | Admit: 2015-03-20 | Discharge: 2015-03-20 | Disposition: A | Discharge status: Home / Self Care | Payer: 59 | Attending: Emergency Medicine | Admitting: Emergency Medicine

## 2015-03-20 ENCOUNTER — Encounter: Payer: Self-pay | Admitting: Gastroenterology

## 2015-03-20 ENCOUNTER — Telehealth: Payer: Self-pay | Admitting: Gastroenterology

## 2015-03-20 ENCOUNTER — Encounter (HOSPITAL_COMMUNITY): Payer: Self-pay

## 2015-03-20 DIAGNOSIS — Z87448 Personal history of other diseases of urinary system: Secondary | ICD-10-CM | POA: Insufficient documentation

## 2015-03-20 DIAGNOSIS — Z91040 Latex allergy status: Secondary | ICD-10-CM | POA: Insufficient documentation

## 2015-03-20 DIAGNOSIS — G8929 Other chronic pain: Secondary | ICD-10-CM | POA: Insufficient documentation

## 2015-03-20 DIAGNOSIS — G40909 Epilepsy, unspecified, not intractable, without status epilepticus: Secondary | ICD-10-CM | POA: Insufficient documentation

## 2015-03-20 DIAGNOSIS — Z79899 Other long term (current) drug therapy: Secondary | ICD-10-CM | POA: Insufficient documentation

## 2015-03-20 DIAGNOSIS — K279 Peptic ulcer, site unspecified, unspecified as acute or chronic, without hemorrhage or perforation: Secondary | ICD-10-CM | POA: Diagnosis not present

## 2015-03-20 DIAGNOSIS — Z9884 Bariatric surgery status: Secondary | ICD-10-CM | POA: Insufficient documentation

## 2015-03-20 DIAGNOSIS — Z8639 Personal history of other endocrine, nutritional and metabolic disease: Secondary | ICD-10-CM | POA: Insufficient documentation

## 2015-03-20 DIAGNOSIS — Z720 Tobacco use: Secondary | ICD-10-CM | POA: Diagnosis not present

## 2015-03-20 DIAGNOSIS — F419 Anxiety disorder, unspecified: Secondary | ICD-10-CM | POA: Insufficient documentation

## 2015-03-20 DIAGNOSIS — R011 Cardiac murmur, unspecified: Secondary | ICD-10-CM | POA: Insufficient documentation

## 2015-03-20 DIAGNOSIS — G43909 Migraine, unspecified, not intractable, without status migrainosus: Secondary | ICD-10-CM | POA: Diagnosis not present

## 2015-03-20 DIAGNOSIS — Z862 Personal history of diseases of the blood and blood-forming organs and certain disorders involving the immune mechanism: Secondary | ICD-10-CM | POA: Insufficient documentation

## 2015-03-20 DIAGNOSIS — R109 Unspecified abdominal pain: Secondary | ICD-10-CM | POA: Diagnosis present

## 2015-03-20 LAB — URINALYSIS, ROUTINE W REFLEX MICROSCOPIC
BILIRUBIN URINE: NEGATIVE
Glucose, UA: NEGATIVE mg/dL
Ketones, ur: NEGATIVE mg/dL
NITRITE: NEGATIVE
Protein, ur: NEGATIVE mg/dL
Specific Gravity, Urine: 1.025 (ref 1.005–1.030)
Urobilinogen, UA: 0.2 mg/dL (ref 0.0–1.0)
pH: 7.5 (ref 5.0–8.0)

## 2015-03-20 LAB — URINE MICROSCOPIC-ADD ON

## 2015-03-20 MED ORDER — DIPHENHYDRAMINE HCL 25 MG PO CAPS
25.0000 mg | ORAL_CAPSULE | Freq: Once | ORAL | Status: AC
  Administered 2015-03-20: 25 mg via ORAL
  Filled 2015-03-20: qty 1

## 2015-03-20 MED ORDER — PROMETHAZINE HCL 12.5 MG PO TABS
25.0000 mg | ORAL_TABLET | Freq: Once | ORAL | Status: AC
  Administered 2015-03-20: 25 mg via ORAL
  Filled 2015-03-20: qty 2

## 2015-03-20 MED ORDER — OXYCODONE-ACETAMINOPHEN 5-325 MG PO TABS
2.0000 | ORAL_TABLET | Freq: Once | ORAL | Status: AC
  Administered 2015-03-20: 2 via ORAL
  Filled 2015-03-20: qty 2

## 2015-03-20 MED ORDER — HYDROMORPHONE HCL 2 MG/ML IJ SOLN
2.0000 mg | Freq: Once | INTRAMUSCULAR | Status: AC
  Administered 2015-03-20: 2 mg via INTRAMUSCULAR
  Filled 2015-03-20: qty 1

## 2015-03-20 MED ORDER — SUCRALFATE 1 G PO TABS
1.0000 g | ORAL_TABLET | Freq: Three times a day (TID) | ORAL | Status: DC
  Administered 2015-03-20: 1 g via ORAL
  Filled 2015-03-20: qty 1

## 2015-03-20 NOTE — Telephone Encounter (Signed)
PT NEEDS FIRST AVAILABLE OPV E30 EPIGASTRIC PAIN. LAST SEEN 2013. RECORDS REVIEWED FROM 2013 TO PRESENT. WEIGHT UP FROM 211 TO 249 LBS.

## 2015-03-20 NOTE — Telephone Encounter (Signed)
-----   Message from Varney Biles, MD sent at 03/20/2015  7:14 AM EDT ----- Hey Dr. Oneida Alar,  We have seen patient [place] in the ER 2 straight days with epigastric pain, severe. She has been seen by your clinic in the past. 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 She has been lost to follow up. Will your team please get her in to your clinic at the earliest convenience for optimal care. We truly appreciate your help.  Sincerely,  ankit n

## 2015-03-20 NOTE — ED Provider Notes (Signed)
CSN: [id number]     Arrival date & time 03/20/15  0440 History   First MD Initiated Contact with Patient 03/20/15 0518     Chief Complaint  Patient presents with  . Abdominal Pain     (Consider location/radiation/quality/duration/timing/severity/associated sxs/prior Treatment) HPI Comments: Pt comes in with cc of abd pain. Pt has hx of gastric bypass surgery and peptic ulcers disease. She reports that she has been having severe abdominal pain, nausea, emesis and diarrhea for the past 3 days. She reports 10+ episodes of emesis and diarrhea - bilious, non bloody. There is no current nausea however. Pain is located in the epigastrium, and is moving to the right side. The pain is sharp. Pt was seen yday for the same pain - and she had a CT scan of the abdomen done, which was neg for any internal hernias, perforations.   ROS 10 Systems reviewed and are negative for acute change except as noted in the HPI.     Patient is a 29 y.o. female presenting with abdominal pain.  Abdominal Pain Associated symptoms: diarrhea, nausea and vomiting   Associated symptoms: no chest pain, no dysuria and no shortness of breath     Past Medical History  Diagnosis Date  . Migraines   . Interstitial cystitis   . IUD FEB 2010  . Gastritis JULY 2011  . Depression   . 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 Fatty liver   . Obesity (BMI 30-39.9) 2011 228 LBS BMI 36.8  . Iron deficiency anemia 07/23/2010  . Irritable bowel syndrome 2012 DIARRHEA    JUN 2012 TTG IgA 14.9  . Elevated liver enzymes JUL 2011ALK PHOS 111-127 AST  143-267 ALT  213-321T BILI 0.6  ALB  3.7-4.06 Jun 2011 ALK PHOS 118 AST 24 ALT 42 T BILI 0.4 ALB 3.9  . Chronic daily headache   . Ovarian cyst   . PONV (postoperative nausea and vomiting)   . ADD (attention deficit disorder)   . RLQ abdominal pain 07/18/2013  . Menorrhagia 07/18/2013  . Anxiety   . Potassium (K) deficiency   . Dysmenorrhea 09/18/2013  . Stress 09/18/2013  .  Patient desires pregnancy 09/18/2013  . Pregnant 12/25/2013  . Blood transfusion without reported diagnosis   . Heart murmur   . Gastric bypass status for obesity   . 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 Dysrhythmia   . Sciatica of left side 11/14/2014  . Fibromyalgia   . 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 Gab  2007    in [place]-POUCH 5 CM  . Cholecystectomy  2005    biliary dyskinesia  . Cath self every nite      for sodium bicarb injection (discontinued 2013)  . Colonoscopy  JUN 2012 ABD PN/DIARRHEA WITH PROPOFOL    NL COLON  . Upper gastrointestinal endoscopy  JULY 2011 NAUSEA-D125,V6, PH 25    Bx; GASTRITIS, POUCH-5 CM LONG  . Dilation and curettage of uterus    . Tonsillectomy    . Gastric bypass  06/2006  . Savory dilation  06/20/2012    ZPC:KJQV gastritis/Ulcer in the mid jejunum  . Tonsillectomy and adenoidectomy    . Esophagogastroduodenoscopy    . Wisdom tooth extraction    . Repair vaginal cuff N/A 07/30/2014    Procedure: REPAIR VAGINAL CUFF;  Surgeon: Catalina Antigua, MD;  Location: [facility];  Service: Gynecology;  Laterality: N/A;  . Hysteroscopy w/d&c N/A 09/12/2014    Procedure: DILATATION AND CURETTAGE /HYSTEROSCOPY;  Surgeon: Jonnie Kind, MD;  Location: [facility];  Service: Gynecology;  Laterality: N/A;   Family History  Problem Relation Age of Onset  . Hemochromatosis Maternal Grandmother   . Migraines Maternal Grandmother   . Cancer Maternal Grandmother   . Breast cancer Maternal Grandmother   . Hypertension Father   . Diabetes Father   . Coronary artery disease Father   . Migraines Paternal Grandmother   . Breast cancer Paternal Grandmother   . Cancer Mother     breast  . Hemochromatosis Mother   . Breast cancer Mother   . Coronary artery disease Paternal Grandfather    History  Substance Use Topics  . Smoking status: Current Every Day Smoker -- 0.50 packs/day for  6 years    Types: Cigarettes  . Smokeless tobacco: Never Used  . Alcohol Use: No   OB History    Gravida Para Term Preterm AB TAB SAB Ectopic Multiple Living   '3 2 1 1 1  1   2     '$ Review of Systems  Constitutional: Positive for activity change.  Respiratory: Negative for shortness of breath.   Cardiovascular: Negative for chest pain.  Gastrointestinal: Positive for nausea, vomiting, abdominal pain and diarrhea.  Genitourinary: Negative for dysuria.  Musculoskeletal: Negative for neck pain.  All other systems reviewed and are negative.     Allergies  Gabapentin; Metoclopramide hcl; Codeine; Zofran; Latex; and Tape  Home Medications   Prior to Admission medications   Medication Sig Start Date End Date Taking? Authorizing Provider  busPIRone (BUSPAR) 7.5 MG tablet Take 1 tablet (7.5 mg total) by mouth at bedtime. 03/03/15   Kathrynn Ducking, MD  Clobetasol Prop Emollient Base 0.05 % emollient cream Apply small amount to affected area BID Patient taking differently: Apply 1 application topically 2 (two) times daily. Use for 3 weeks starting 02/17/15 02/18/15   Christin Fudge, CNM  clonazePAM (KLONOPIN) 0.5 MG tablet Take 1 tablet (0.5 mg total) by mouth 3 (three) times daily. 03/03/15   Kathrynn Ducking, MD  cyclobenzaprine (FLEXERIL) 5 MG tablet TAKE 1 TABLET BY MOUTH EVERY EIGHT HOURS AS NEEDED FOR MUSCLE SPASMS. 03/17/15   Kathrynn Ducking, MD  DULoxetine (CYMBALTA) 30 MG capsule Take 1-2 capsules (30-60 mg total) by mouth 2 (two) times daily. Take 1 capsule (30 mg) every morning and 2 capsules (60 mg) every night 02/24/15   Silver Huguenin Elgergawy, MD  esomeprazole (NEXIUM) 40 MG capsule Take 1 capsule (40 mg total) by mouth daily. 03/19/15   Evelina Bucy, MD  ferrous sulfate 325 (65 FE) MG tablet Take 1 tablet (325 mg total) by mouth 2 (two) times daily with a meal. 02/06/15   Okey Regal, PA-C  HYDROcodone-acetaminophen (NORCO/VICODIN) 5-325 MG per tablet Take 1 tablet by mouth  every 6 (six) hours as needed for moderate pain. 03/19/15   Evelina Bucy, MD  ketorolac (TORADOL) 10 MG tablet Take 1 tablet (10 mg total) by mouth every 8 (eight) hours as needed for moderate pain. Patient not taking: Reported on 03/19/2015 02/12/15   Kathrynn Ducking, MD  promethazine (PHENERGAN) 12.5 MG tablet Take 1 tablet (12.5 mg total) by mouth every 6 (six) hours as needed for nausea or vomiting. 02/24/15   Albertine Patricia, MD  promethazine (PHENERGAN) 25 MG tablet Take 1 tablet (25 mg total) by mouth every 6 (six) hours  as needed for nausea or vomiting. 03/19/15   Evelina Bucy, MD  sucralfate (CARAFATE) 1 G tablet Take 1 tablet (1 g total) by mouth 4 (four) times daily. 03/19/15   Evelina Bucy, MD  topiramate (TOPAMAX) 100 MG tablet Take 1 tablet (100 mg total) by mouth at bedtime. 02/18/15   Kathrynn Ducking, MD  zolpidem (AMBIEN) 5 MG tablet Take 1 tablet (5 mg total) by mouth at bedtime as needed for sleep. 12/26/14   Kathyrn Drown, MD   BP 122/77 mmHg  Pulse 88  Temp(Src) 98.3 F (36.8 C) (Oral)  Resp 20  Ht $R'5\' 6"'nr$  (1.676 m)  Wt 243 lb (110.224 kg)  BMI 39.24 kg/m2  SpO2 98%  LMP 03/19/2015 (Exact Date) Physical Exam  Constitutional: She is oriented to person, place, and time. She appears well-developed and well-nourished.  HENT:  Head: Normocephalic and atraumatic.  Eyes: Conjunctivae are normal.  Neck: Neck supple.  Cardiovascular: Normal rate, regular rhythm and normal heart sounds.   Pulmonary/Chest: Effort normal and breath sounds normal. No respiratory distress.  Abdominal: Soft. She exhibits no distension. There is tenderness. There is guarding. There is no rebound.  Epigastric and RUQ tenderness. No rebound findings.  Neurological: She is alert and oriented to person, place, and time.  Skin: Skin is warm and dry.  Nursing note and vitals reviewed.   ED Course  Procedures (including critical care time) Labs Review Labs Reviewed  URINALYSIS, ROUTINE W REFLEX  MICROSCOPIC - Abnormal; Notable for the following:    Hgb urine dipstick LARGE (*)    Leukocytes, UA TRACE (*)    All other components within normal limits  URINE MICROSCOPIC-ADD ON - Abnormal; Notable for the following:    Squamous Epithelial / LPF FEW (*)    Bacteria, UA MANY (*)    All other components within normal limits    Imaging Review Ct Abdomen Pelvis W Contrast  03/19/2015   CLINICAL DATA:  Upper abdominal pain with nausea and vomiting for 3 days  EXAM: CT ABDOMEN AND PELVIS WITH CONTRAST  TECHNIQUE: Multidetector CT imaging of the abdomen and pelvis was performed using the standard protocol following bolus administration of intravenous contrast.  CONTRAST:  143mL OMNIPAQUE IOHEXOL 300 MG/ML  SOLN  COMPARISON:  09/03/2014  FINDINGS: BODY WALL: No contributory findings.  LOWER CHEST: No contributory findings.  ABDOMEN/PELVIS:  Liver: No focal abnormality.  Biliary: Cholecystectomy with chronic mild intra and extrahepatic biliary dilatation, likely reservoir effect given stability.  Pancreas: Unremarkable.  Spleen: Unremarkable.  Adrenals: Unremarkable.  Kidneys and ureters: No hydronephrosis or stone.  Bladder: Unremarkable.  Reproductive: Interval normalization of the uterus. There is a simple appearing 4 cm cyst in the right ovary. Given development since September 2015 this is consistent with a benign, likely physiologic process.  Bowel: Roux-en-Y gastric bypass. No evidence of gastrogastric fistula, marginal ulcer, or obstruction. Normal appendix.  Retroperitoneum: No mass or adenopathy.  Peritoneum: No ascites or pneumoperitoneum.  Vascular: No acute abnormality.  OSSEOUS: No acute abnormalities.  IMPRESSION: 1. No acute findings. 2. 4 cm right ovarian cyst. 3. Chronic and postoperative changes are described above.   Electronically Signed   By: Monte Fantasia M.D.   On: 03/19/2015 06:55   Dg Abd Acute W/chest  03/20/2015   CLINICAL DATA:  Abdominal pain with vomiting and diarrhea for 3  days. Gastric ulcers.  EXAM: DG ABDOMEN ACUTE W/ 1V CHEST  COMPARISON:  Abdominal CT from 1 day prior  FINDINGS: There is no  evidence of dilated bowel loops or free intraperitoneal air. No radiopaque calculi or other significant radiographic abnormality is seen. There are changes of gastric bypass and cholecystectomy. Heart size and mediastinal contours are within normal limits. Both lungs are clear.  IMPRESSION: 1. Negative abdominal radiographs. No acute cardiopulmonary disease. 2. Cholecystectomy and gastric bypass.   Electronically Signed   By: Monte Fantasia M.D.   On: 03/20/2015 06:52     EKG Interpretation None      '@7'$ :10 am - pain improved with dilaudid. Will d.c. Will message her GI team to see if she can bee seen sooner. UA shows no signs of dehydration, + RBCs, but she is on period. No uti like sx.  MDM   Final diagnoses:  Peptic ulcer disease    Pt comes in with cc of abdominal pain, epigastric with nausea and emesis. DDx includes: Pancreatitis Hepatobiliary pathology including cholecystitis Gastritis/PUD SBO ACS syndrome Internal hernias  Based on the history provided, suspicion high for PUD or internal hernias. Pt had a CT abdomen done just yday, along with GI labs - all were neg. Urine was clear. Although the exam is impressive for severe pain - i doubt the results from lab work or CT imaging would be any different. Pt will get im dilaudid, and oral meds, will get AAS to ensure there is no evidence of perforated viscus, and we will start oral challenge. Advised pt to see GI doctor. I will email Dr. Oneida Alar, who patient has seen before, to get to see her sooner if possible.       Varney Biles, MD 03/20/15 [id number]

## 2015-03-20 NOTE — Telephone Encounter (Signed)
APPOINTMENT MADE AND LETTER SENT °

## 2015-03-20 NOTE — ED Notes (Signed)
Pt reports abd pain, vomiting, and diarrhea x 3 days, seen at [facility] E.[facility]. Last night and started on Nexium and Carafate for Gastric Ulcers.  Pt is not improved since then.

## 2015-03-20 NOTE — Discharge Instructions (Signed)
See Dr. Oneida Alar as soon as possible. Keep taking the ulcer meds.   Food Choices for Peptic Ulcer Disease When you have peptic ulcer disease, the foods you eat and your eating habits are very important. Choosing the right foods can help ease the discomfort of peptic ulcer disease. WHAT GENERAL GUIDELINES DO I NEED TO FOLLOW?  Choose fruits, vegetables, whole grains, and low-fat meat, fish, and poultry.   Keep a food diary to identify foods that cause symptoms.  Avoid foods that cause irritation or pain. These may be different for different people.  Eat frequent small meals instead of three large meals each day. The pain may be worse when your stomach is empty.  Avoid eating close to bedtime. WHAT FOODS ARE NOT RECOMMENDED? The following are some foods and drinks that may worsen your symptoms:  Black, white, and red pepper.  Hot sauce.  Chili peppers.  Chili powder.  Chocolate and cocoa.   Alcohol.  Tea, coffee, and cola (regular and decaffeinated). The items listed above may not be a complete list of foods and beverages to avoid. Contact your dietitian for more information. Document Released: 02/14/2012 Document Revised: 11/27/2013 Document Reviewed: 09/26/2013 [facility] Patient Information 2015 [place], [place]. This information is not intended to replace advice given to you by your health care provider. Make sure you discuss any questions you have with your health care provider.  Peptic Ulcer A peptic ulcer is a sore in the lining of your esophagus (esophageal ulcer), stomach (gastric ulcer), or in the first part of your small intestine (duodenal ulcer). The ulcer causes erosion into the deeper tissue. CAUSES  Normally, the lining of the stomach and the small intestine protects itself from the acid that digests food. The protective lining can be damaged by:  An infection caused by a bacterium called Helicobacter pylori (H. pylori).  Regular use of nonsteroidal  anti-inflammatory drugs (NSAIDs), such as ibuprofen or aspirin.  Smoking tobacco. Other risk factors include being older than 24, drinking alcohol excessively, and having a family history of ulcer disease.  SYMPTOMS   Burning pain or gnawing in the area between the chest and the belly button.  Heartburn.  Nausea and vomiting.  Bloating. The pain can be worse on an empty stomach and at night. If the ulcer results in bleeding, it can cause:  Black, tarry stools.  Vomiting of bright red blood.  Vomiting of coffee-ground-looking materials. DIAGNOSIS  A diagnosis is usually made based upon your history and an exam. Other tests and procedures may be performed to find the cause of the ulcer. Finding a cause will help determine the best treatment. Tests and procedures may include:  Blood tests, stool tests, or breath tests to check for the bacterium H. pylori.  An upper gastrointestinal (GI) series of the esophagus, stomach, and small intestine.  An endoscopy to examine the esophagus, stomach, and small intestine.  A biopsy. TREATMENT  Treatment may include:  Eliminating the cause of the ulcer, such as smoking, NSAIDs, or alcohol.  Medicines to reduce the amount of acid in your digestive tract.  Antibiotic medicines if the ulcer is caused by the H. pylori bacterium.  An upper endoscopy to treat a bleeding ulcer.  Surgery if the bleeding is severe or if the ulcer created a hole somewhere in the digestive system. HOME CARE INSTRUCTIONS   Avoid tobacco, alcohol, and caffeine. Smoking can increase the acid in the stomach, and continued smoking will impair the healing of ulcers.  Avoid foods and  drinks that seem to cause discomfort or aggravate your ulcer.  Only take medicines as directed by your caregiver. Do not substitute over-the-counter medicines for prescription medicines without talking to your caregiver.  Keep any follow-up appointments and tests as directed. SEEK  MEDICAL CARE IF:   Your do not improve within 7 days of starting treatment.  You have ongoing indigestion or heartburn. SEEK IMMEDIATE MEDICAL CARE IF:   You have sudden, sharp, or persistent abdominal pain.  You have bloody or dark black, tarry stools.  You vomit blood or vomit that looks like coffee grounds.  You become light-headed, weak, or feel faint.  You become sweaty or clammy. MAKE SURE YOU:   Understand these instructions.  Will watch your condition.  Will get help right away if you are not doing well or get worse. Document Released: 11/19/2000 Document Revised: 04/08/2014 Document Reviewed: 06/21/2012 [facility] Patient Information 2015 [place], [place]. This information is not intended to replace advice given to you by your health care provider. Make sure you discuss any questions you have with your health care provider.

## 2015-03-21 IMAGING — RF DG FLUORO GUIDE LUMBAR PUNCTURE
1 series · 1 of 1 positions shown · non-contrast
Comparison: none

CLINICAL DATA: Peripheral neuropathy. Evaluation for chronic
inflammatory demyelinating polyneuropathy.

[Series 1: (hospital) · 1 of 1 slices shown]
[im 1/1]
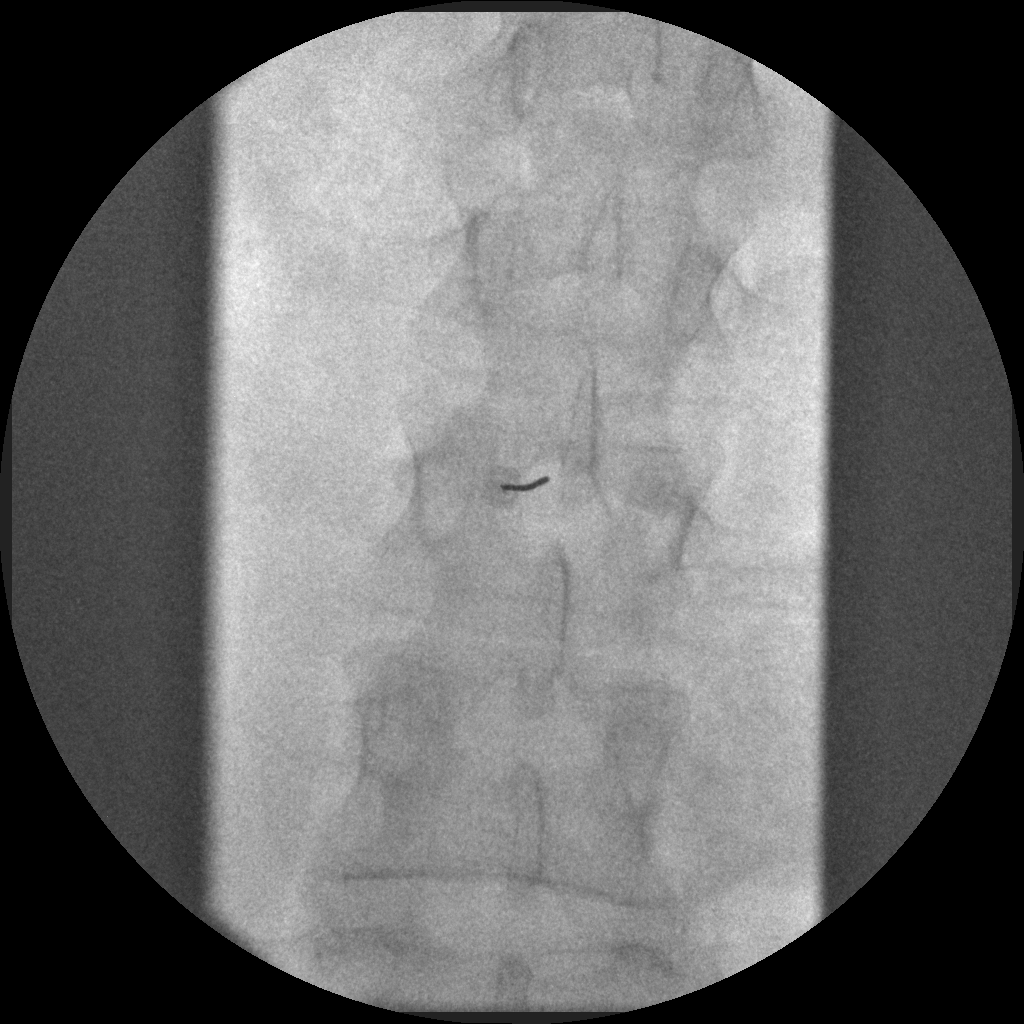

[1 of 1 positions shown; findings below may reference images not displayed]

EXAM:
DIAGNOSTIC LUMBAR PUNCTURE UNDER FLUOROSCOPIC GUIDANCE

FLUOROSCOPY TIME:  Fluoroscopy Time (in minutes and seconds): 0
minutes 18 seconds

Number of Acquired Images:  0

PROCEDURE:
Informed consent was obtained from the patient prior to the
procedure, including potential complications of headache, allergy,
and pain. With the patient prone, the lower back was prepped with
Betadine. 1% Lidocaine was used for local anesthesia. Lumbar
puncture was performed at the L2-3 level using a 6 inch, 20 gauge
needle via a left paramedian approach with return of clear CSF with
an opening pressure of 20 cm water (measured in the left lateral
decubitus position). 7 ml of CSF were obtained for laboratory
studies.

The patient tolerated the procedure and there were no apparent
complications. Procedure related pain was treated in the recovery
area and the patient was discharged after approximately 35 minutes
of observation in good condition.
IMPRESSION: Successful fluoroscopic guided lumbar puncture.

## 2015-03-25 ENCOUNTER — Telehealth: Payer: Self-pay | Admitting: Neurology

## 2015-03-25 NOTE — Telephone Encounter (Signed)
I called the patient. She has well documented pseudoseizures, OK for her to return to work I will dictate a letter. Will try to send it to her e-mail.

## 2015-03-25 NOTE — Telephone Encounter (Signed)
Patient is calling to let you know that she had a seizure last week at work and her place of employment will not let her return to work until she gets a Quar[employer] manager from you. Could you please email to [email or web address].

## 2015-03-26 ENCOUNTER — Encounter: Payer: Self-pay | Admitting: Neurology

## 2015-03-26 NOTE — Telephone Encounter (Signed)
I called the patient to let her know the letter is ready at the front desk for her to pick up. She stated she would send her dad.

## 2015-04-02 ENCOUNTER — Telehealth: Payer: Self-pay | Admitting: Family Medicine

## 2015-04-02 ENCOUNTER — Other Ambulatory Visit: Payer: Self-pay | Admitting: *Deleted

## 2015-04-02 MED ORDER — HYDROCODONE-ACETAMINOPHEN 5-325 MG PO TABS: 1.0000 | ORAL_TABLET | Freq: Four times a day (QID) | ORAL | Status: DC | PRN

## 2015-04-02 NOTE — Telephone Encounter (Signed)
[facility]

## 2015-04-02 NOTE — Telephone Encounter (Signed)
Pt wants to speak with a nurse regarding a recently er visit.

## 2015-04-02 NOTE — Telephone Encounter (Signed)
May have one refill for hydrocodone 10 tablets only not for long-term use we will not be doing repetitive refills, keep follow-up with Dr. Oneida Alar

## 2015-04-02 NOTE — Telephone Encounter (Signed)
Pt states she has been seen twice in the ed once at [facility] and last at [facility] for seizures and stomach cramping. Pt has a referral to dr Nona Dell on may 3rd. States she is having severe stomach pain in the upper mid abd region, nausea and vomiting started back last night. Pt states she does not have insurance and would like a refill on hydrocodone that was given in the ed. [place]

## 2015-04-02 NOTE — Telephone Encounter (Signed)
Script for hydrocodone ready for pick up. Pt notified. See message below about adderall

## 2015-04-02 NOTE — Telephone Encounter (Signed)
Pt also wants to know if she can get back on adderall if she was to make an appt.

## 2015-04-03 NOTE — Telephone Encounter (Signed)
I do not feel comfortable prescribing this patient Adderall. I believe it is best under the guidance of mental health.

## 2015-04-03 NOTE — Telephone Encounter (Signed)
Discussed with pt

## 2015-04-06 ENCOUNTER — Emergency Department (HOSPITAL_COMMUNITY)
Admission: EM | Admit: 2015-04-06 | Discharge: 2015-04-06 | Disposition: A | Discharge status: Home / Self Care | Payer: 59 | Attending: Emergency Medicine | Admitting: Emergency Medicine

## 2015-04-06 ENCOUNTER — Encounter (HOSPITAL_COMMUNITY): Payer: Self-pay | Admitting: Cardiology

## 2015-04-06 DIAGNOSIS — Z8742 Personal history of other diseases of the female genital tract: Secondary | ICD-10-CM | POA: Insufficient documentation

## 2015-04-06 DIAGNOSIS — R011 Cardiac murmur, unspecified: Secondary | ICD-10-CM | POA: Insufficient documentation

## 2015-04-06 DIAGNOSIS — F445 Conversion disorder with seizures or convulsions: Secondary | ICD-10-CM

## 2015-04-06 DIAGNOSIS — Z9884 Bariatric surgery status: Secondary | ICD-10-CM | POA: Insufficient documentation

## 2015-04-06 DIAGNOSIS — G43909 Migraine, unspecified, not intractable, without status migrainosus: Secondary | ICD-10-CM | POA: Diagnosis not present

## 2015-04-06 DIAGNOSIS — M797 Fibromyalgia: Secondary | ICD-10-CM | POA: Diagnosis not present

## 2015-04-06 DIAGNOSIS — Z91040 Latex allergy status: Secondary | ICD-10-CM | POA: Insufficient documentation

## 2015-04-06 DIAGNOSIS — R109 Unspecified abdominal pain: Secondary | ICD-10-CM | POA: Diagnosis present

## 2015-04-06 DIAGNOSIS — G40909 Epilepsy, unspecified, not intractable, without status epilepticus: Secondary | ICD-10-CM | POA: Diagnosis not present

## 2015-04-06 DIAGNOSIS — F419 Anxiety disorder, unspecified: Secondary | ICD-10-CM | POA: Insufficient documentation

## 2015-04-06 DIAGNOSIS — M545 Low back pain: Secondary | ICD-10-CM | POA: Insufficient documentation

## 2015-04-06 DIAGNOSIS — Z9089 Acquired absence of other organs: Secondary | ICD-10-CM | POA: Diagnosis not present

## 2015-04-06 DIAGNOSIS — R1084 Generalized abdominal pain: Secondary | ICD-10-CM | POA: Insufficient documentation

## 2015-04-06 DIAGNOSIS — Z79899 Other long term (current) drug therapy: Secondary | ICD-10-CM | POA: Diagnosis not present

## 2015-04-06 DIAGNOSIS — R1031 Right lower quadrant pain: Secondary | ICD-10-CM | POA: Diagnosis not present

## 2015-04-06 DIAGNOSIS — Z9889 Other specified postprocedural states: Secondary | ICD-10-CM | POA: Diagnosis not present

## 2015-04-06 DIAGNOSIS — F329 Major depressive disorder, single episode, unspecified: Secondary | ICD-10-CM | POA: Diagnosis not present

## 2015-04-06 DIAGNOSIS — D509 Iron deficiency anemia, unspecified: Secondary | ICD-10-CM | POA: Diagnosis present

## 2015-04-06 DIAGNOSIS — F418 Other specified anxiety disorders: Secondary | ICD-10-CM | POA: Diagnosis present

## 2015-04-06 DIAGNOSIS — Z720 Tobacco use: Secondary | ICD-10-CM | POA: Insufficient documentation

## 2015-04-06 LAB — URINALYSIS, ROUTINE W REFLEX MICROSCOPIC
Glucose, UA: NEGATIVE mg/dL
Hgb urine dipstick: NEGATIVE
Ketones, ur: NEGATIVE mg/dL
Leukocytes, UA: NEGATIVE
Nitrite: NEGATIVE
Protein, ur: NEGATIVE mg/dL
Specific Gravity, Urine: >1.03 — ABNORMAL HIGH (ref 1.005–1.030)
Urobilinogen, UA: 1 mg/dL (ref 0.0–1.0)
pH: 6 (ref 5.0–8.0)

## 2015-04-06 LAB — RAPID URINE DRUG SCREEN, HOSP PERFORMED
AMPHETAMINES: NOT DETECTED
Barbiturates: NOT DETECTED
Benzodiazepines: POSITIVE — AB
COCAINE: NOT DETECTED
OPIATES: POSITIVE — AB
TETRAHYDROCANNABINOL: NOT DETECTED

## 2015-04-06 MED ORDER — ONDANSETRON 8 MG PO TBDP
ORAL_TABLET | ORAL | Status: AC
  Filled 2015-04-06: qty 1

## 2015-04-06 MED ORDER — TRAMADOL HCL 50 MG PO TABS
50.0000 mg | ORAL_TABLET | Freq: Once | ORAL | Status: DC
  Filled 2015-04-06: qty 1

## 2015-04-06 MED ORDER — LORAZEPAM 2 MG/ML IJ SOLN
2.0000 mg | Freq: Once | INTRAMUSCULAR | Status: AC
  Administered 2015-04-06: 2 mg via INTRAMUSCULAR
  Filled 2015-04-06: qty 1

## 2015-04-06 MED ORDER — GI COCKTAIL ~~LOC~~
30.0000 mL | Freq: Once | ORAL | Status: AC
  Administered 2015-04-06: 30 mL via ORAL

## 2015-04-06 MED ORDER — HYDROMORPHONE HCL 2 MG/ML IJ SOLN
INTRAMUSCULAR | Status: AC
  Administered 2015-04-06: 2 mg via INTRAVENOUS
  Filled 2015-04-06: qty 1

## 2015-04-06 MED ORDER — GI COCKTAIL ~~LOC~~
ORAL | Status: AC
  Administered 2015-04-06: 30 mL via ORAL
  Filled 2015-04-06: qty 30

## 2015-04-06 MED ORDER — OXYCODONE-ACETAMINOPHEN 5-325 MG PO TABS: 1.0000 | ORAL_TABLET | ORAL | Status: DC | PRN

## 2015-04-06 MED ORDER — SODIUM CHLORIDE 0.9 % IV SOLN: INTRAVENOUS | Status: DC

## 2015-04-06 MED ORDER — HYDROMORPHONE HCL 2 MG/ML IJ SOLN: 2.0000 mg | INTRAMUSCULAR | Status: DC | PRN

## 2015-04-06 MED ORDER — SODIUM CHLORIDE 0.9 % IV SOLN
INTRAVENOUS | Status: DC
  Administered 2015-04-06: 125 mL/h via INTRAVENOUS

## 2015-04-06 MED ORDER — SODIUM CHLORIDE 0.9 % IV BOLUS (SEPSIS)
500.0000 mL | Freq: Once | INTRAVENOUS | Status: AC
  Administered 2015-04-06: 500 mL via INTRAVENOUS

## 2015-04-06 MED ORDER — HYDROMORPHONE HCL 2 MG/ML IJ SOLN
2.0000 mg | Freq: Once | INTRAMUSCULAR | Status: AC
  Administered 2015-04-06: 2 mg via INTRAVENOUS
  Filled 2015-04-06: qty 1

## 2015-04-06 MED ORDER — HYDROMORPHONE HCL 2 MG/ML IJ SOLN
2.0000 mg | Freq: Once | INTRAMUSCULAR | Status: AC
  Administered 2015-04-06: 2 mg via INTRAVENOUS

## 2015-04-06 MED ORDER — DIPHENHYDRAMINE HCL 25 MG PO CAPS
25.0000 mg | ORAL_CAPSULE | Freq: Once | ORAL | Status: AC
  Administered 2015-04-06: 25 mg via ORAL
  Filled 2015-04-06: qty 1

## 2015-04-06 MED ORDER — SODIUM CHLORIDE 0.9 % IV BOLUS (SEPSIS): 500.0000 mL | Freq: Once | INTRAVENOUS | Status: DC

## 2015-04-06 MED ORDER — HYDROCODONE-ACETAMINOPHEN 5-325 MG PO TABS
1.0000 | ORAL_TABLET | Freq: Once | ORAL | Status: AC
  Administered 2015-04-06: 1 via ORAL
  Filled 2015-04-06: qty 1

## 2015-04-06 NOTE — ED Notes (Addendum)
Went back in room to check on pt, pt having pseudoseizures again, after about 20 sec pt wakes back up c/o of stomach pain and then had another pseudoseizure. Pt resting calmly right now still c/o epigastric pain. MD made aware.

## 2015-04-06 NOTE — ED Notes (Signed)
Pt states last normal BM was about 1 week ago, states she does not routinely have constipation

## 2015-04-06 NOTE — ED Notes (Signed)
Pt c/o epigastric pt again, pt awake and alert moving around in bed. MD notified.

## 2015-04-06 NOTE — ED Provider Notes (Addendum)
CSN: [id number]     Arrival date & time 04/06/15  2336 History   First MD Initiated Contact with Patient 04/06/15 [phone]     Chief Complaint  Patient presents with  . Abdominal Pain     (Consider location/radiation/quality/duration/timing/severity/associated sxs/prior Treatment) HPI   Penny [name] is a 29 y.o. female here for evaluation of recurrent nausea, vomiting, diarrhea and abdominal pain. Symptoms onset 3 days ago. Symptoms similar to when evaluated here for same, on 03/20/2015. At the time she was referred to her GI doctor. She is not seeing GI yet. She has history of evaluation by GI past with endoscopy is being done. She is status post gastric bypass. She had an abdominal CT 03/19/2015, when she was also having similar pain. She states the pain has been so severe that she has "passed out. She has been taking Norco at home without relief of the pain. She has "nonepileptic seizures". For that, she uses Klonopin. She denies fever, chills, cough or chest pain. She has chronic ongoing low back pain. There are no other known modifying factors.   Past Medical History  Diagnosis Date  . Migraines   . Interstitial cystitis   . IUD FEB 2010  . Gastritis JULY 2011  . Depression   . 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 Fatty liver   . Obesity (BMI 30-39.9) 2011 228 LBS BMI 36.8  . Iron deficiency anemia 07/23/2010  . Irritable bowel syndrome 2012 DIARRHEA    JUN 2012 TTG IgA 14.9  . Elevated liver enzymes JUL 2011ALK PHOS 111-127 AST  143-267 ALT  213-321T BILI 0.6  ALB  3.7-4.06 Jun 2011 ALK PHOS 118 AST 24 ALT 42 T BILI 0.4 ALB 3.9  . Chronic daily headache   . Ovarian cyst   . PONV (postoperative nausea and vomiting)   . ADD (attention deficit disorder)   . RLQ abdominal pain 07/18/2013  . Menorrhagia 07/18/2013  . Anxiety   . Potassium (K) deficiency   . Dysmenorrhea 09/18/2013  . Stress 09/18/2013  . Patient desires pregnancy 09/18/2013  . Pregnant 12/25/2013  . Blood  transfusion without reported diagnosis   . Heart murmur   . Gastric bypass status for obesity   . 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 Dysrhythmia   . Sciatica of left side 11/14/2014  . Fibromyalgia   . 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 Gab  2007    in [place]-POUCH 5 CM  . Cholecystectomy  2005    biliary dyskinesia  . Cath self every nite      for sodium bicarb injection (discontinued 2013)  . Colonoscopy  JUN 2012 ABD PN/DIARRHEA WITH PROPOFOL    NL COLON  . Upper gastrointestinal endoscopy  JULY 2011 NAUSEA-D125,V6, PH 25    Bx; GASTRITIS, POUCH-5 CM LONG  . Dilation and curettage of uterus    . Tonsillectomy    . Gastric bypass  06/2006  . Savory dilation  06/20/2012    SLP:NPYY gastritis/Ulcer in the mid jejunum  . Tonsillectomy and adenoidectomy    . Esophagogastroduodenoscopy    . Wisdom tooth extraction    . Repair vaginal cuff N/A 07/30/2014    Procedure: REPAIR VAGINAL CUFF;  Surgeon: Mora Bellman, MD;  Location: [facility];  Service: Gynecology;  Laterality: N/A;  . Hysteroscopy w/d&c N/A 09/12/2014    Procedure: DILATATION AND CURETTAGE /  HYSTEROSCOPY;  Surgeon: Jonnie Kind, MD;  Location: [facility];  Service: Gynecology;  Laterality: N/A;   Family History  Problem Relation Age of Onset  . Hemochromatosis Maternal Grandmother   . Migraines Maternal Grandmother   . Cancer Maternal Grandmother   . Breast cancer Maternal Grandmother   . Hypertension Father   . Diabetes Father   . Coronary artery disease Father   . Migraines Paternal Grandmother   . Breast cancer Paternal Grandmother   . Cancer Mother     breast  . Hemochromatosis Mother   . Breast cancer Mother   . Coronary artery disease Paternal Grandfather    History  Substance Use Topics  . Smoking status: Current Every Day Smoker -- 0.50 packs/day for 6 years    Types: Cigarettes  . Smokeless tobacco: Never Used  .  Alcohol Use: No   OB History    Gravida Para Term Preterm AB TAB SAB Ectopic Multiple Living   _0 Review of Systems  All other systems reviewed and are negative.     Allergies  Gabapentin; Metoclopramide hcl; Codeine; Zofran; Latex; and Tape  Home Medications   Prior to Admission medications   Medication Sig Start Date End Date Taking? Authorizing Provider  clonazePAM (KLONOPIN) 0.5 MG tablet Take 1 tablet (0.5 mg total) by mouth 3 (three) times daily. Patient taking differently: Take 0.5 mg by mouth 3 (three) times daily as needed for anxiety.  03/03/15  Yes Kathrynn Ducking, MD  cyclobenzaprine (FLEXERIL) 5 MG tablet TAKE 1 TABLET BY MOUTH EVERY EIGHT HOURS AS NEEDED FOR MUSCLE SPASMS. 03/17/15  Yes Kathrynn Ducking, MD  DULoxetine (CYMBALTA) 30 MG capsule Take 1-2 capsules (30-60 mg total) by mouth 2 (two) times daily. Take 1 capsule (30 mg) every morning and 2 capsules (60 mg) every night 02/24/15  Yes Silver Huguenin Elgergawy, MD  esomeprazole (NEXIUM) 40 MG capsule Take 1 capsule (40 mg total) by mouth daily. 03/19/15  Yes Evelina Bucy, MD  ferrous sulfate 325 (65 FE) MG tablet Take 1 tablet (325 mg total) by mouth 2 (two) times daily with a meal. 02/06/15  Yes Okey Regal, PA-C  HYDROcodone-acetaminophen (NORCO/VICODIN) 5-325 MG per tablet Take 1 tablet by mouth every 6 (six) hours as needed for moderate pain. 04/02/15  Yes Kathyrn Drown, MD  promethazine (PHENERGAN) 12.5 MG tablet Take 1 tablet (12.5 mg total) by mouth every 6 (six) hours as needed for nausea or vomiting. 02/24/15  Yes Albertine Patricia, MD  topiramate (TOPAMAX) 100 MG tablet Take 1 tablet (100 mg total) by mouth at bedtime. 02/18/15  Yes Kathrynn Ducking, MD  zolpidem (AMBIEN) 5 MG tablet Take 1 tablet (5 mg total) by mouth at bedtime as needed for sleep. 12/26/14  Yes Kathyrn Drown, MD  busPIRone (BUSPAR) 7.5 MG tablet Take 1 tablet (7.5 mg total) by mouth at bedtime. Patient not taking: Reported on  04/06/2015 03/03/15   Kathrynn Ducking, MD  Clobetasol Prop Emollient Base 0.05 % emollient cream Apply small amount to affected area BID Patient not taking: Reported on 04/06/2015 02/18/15   Christin Fudge, CNM  ketorolac (TORADOL) 10 MG tablet Take 1 tablet (10 mg total) by mouth every 8 (eight) hours as needed for moderate pain. Patient not taking: Reported on 03/19/2015 02/12/15   Kathrynn Ducking, MD  promethazine (PHENERGAN) 25 MG tablet Take 1 tablet (25 mg total) by mouth  every 6 (six) hours as needed for nausea or vomiting. Patient not taking: Reported on 04/06/2015 03/19/15   Evelina Bucy, MD  sucralfate (CARAFATE) 1 G tablet Take 1 tablet (1 g total) by mouth 4 (four) times daily. Patient not taking: Reported on 04/06/2015 03/19/15   Evelina Bucy, MD   BP 114/57 mmHg  Pulse 60  Temp(Src) 98 F (36.7 C) (Oral)  Resp 16  Ht _0  (1.676 m)  Wt 239 lb (108.41 kg)  BMI 38.59 kg/m2  SpO2 100%  LMP 03/19/2015 (Exact Date) Physical Exam  Constitutional: She is oriented to person, place, and time. She appears well-developed.  Obese  HENT:  Head: Normocephalic and atraumatic.  Right Ear: External ear normal.  Left Ear: External ear normal.  Eyes: Conjunctivae and EOM are normal. Pupils are equal, round, and reactive to light.  Neck: Normal range of motion and phonation normal. Neck supple.  Cardiovascular: Normal rate, regular rhythm and normal heart sounds.   Pulmonary/Chest: Effort normal and breath sounds normal. She exhibits no bony tenderness.  Abdominal: Soft. She exhibits no mass. There is tenderness (right-sided, moderate upper and lower.). There is no rebound and no guarding.  Musculoskeletal: Normal range of motion.  Diffuse lumbar tenderness to light touch. No deformity.  Neurological: She is alert and oriented to person, place, and time. No cranial nerve deficit or sensory deficit. She exhibits normal muscle tone. Coordination normal.  Skin: Skin is warm, dry and intact.   Psychiatric: She has a normal mood and affect. Her behavior is normal. Judgment and thought content normal.  Nursing note and vitals reviewed.   ED Course  Procedures (including critical care time)  Medications  sodium chloride 0.9 % bolus 500 mL (0 mLs Intravenous Hold 04/06/15 1018)  0.9 %  sodium chloride infusion ( Intravenous Hold 04/06/15 1018)  traMADol (ULTRAM) tablet 50 mg (50 mg Oral Not Given 04/06/15 1155)  LORazepam (ATIVAN) injection 2 mg (not administered)  HYDROcodone-acetaminophen (NORCO/VICODIN) 5-325 MG per tablet 1 tablet (1 tablet Oral Given 04/06/15 1218)  HYDROmorphone (DILAUDID) injection 2 mg (2 mg Intravenous Given 04/06/15 1250)    Patient Vitals for the past 24 hrs:  BP Temp Temp src Pulse Resp SpO2 Height Weight  04/06/15 1203 114/57 mmHg - - - - - - -  04/06/15 1202 114/57 mmHg - - 60 16 100 % - -  04/06/15 0914 122/80 mmHg 98 F (36.7 C) Oral 76 16 99 % _1  (1.676 m) 239 lb (108.41 kg)    11:48 AM Reevaluation with update and discussion. After initial assessment and treatment, an updated evaluation reveals nursing has been unable to place an IV. Patient has not had any additional vomiting. Patient's mother is in the room at this time and wonders if her symptoms might be related to an ovarian cyst. We discussed the finding of ovarian cyst at CT, one month ago. The normal follow-up for that would be repeat testing with ultrasound, in another month. It is unlikely that her pain, today represents a ruptured or worsening ovarian cyst. Urinalysis is pending, since she has not been able to void, yet. All questions answered, up till now. The patient requests "something for pain". Leira Regino L   12:15- patient requested medication for pain and was given Norco  13:00- patient's pain has worsened in her abdomen, Dilaudid ordered.  13:10- patient removed her top, because she developed worsening pain in her epigastric area. She is curled into a ball laying on her left  side.  She appears very anxious. Ativan ordered.  14:00- patient had an episode, which her mother states is like her usual pseudoseizures. The patient was on her hands and knees, on the stretcher, complaining of pain, then clenched both hands, and fell to her side, on the stretcher. She became unresponsive. Her mother called nursing and they came in immediately. Patient was initially unresponsive but in 3 minutes she awoke, and was alert and was communicative. I was with her at this time. She complained of abdominal pain. She had moderate epigastric tenderness at this time. Also, she has somewhat slurred speech at this time. I explained to the mother that the patient could not go home at this time, but if she got better, she could go home. If her symptoms cannot be controlled. She will require admission for further evaluation and treatment.  15:00- he has had another pseudoseizure. At this time. She is alert, and still somewhat dysarthric. Normal strength in arms and legs bilaterally. No facial asymmetry. She states that she still has severe abdominal pain and does not feel like she can manage at home.  3:04 PM-Consult complete with Dr. Aileen Fass. Patient case explained and discussed. He agrees to evaluate patient for further evaluation and treatment. Call ended at 15:30  15:55- case discussed with Dr. Jamesetta So, on-call for gastroenterology. He is unable to do a consultation in the ED at this time. This information will be relayed to Dr. Aileen Fass.  16:20- she now states that she is comfortable enough to go home.  Labs Review Labs Reviewed  CBC WITH DIFFERENTIAL/PLATELET - Abnormal; Notable for the following:    RBC 3.79 (*)    Hemoglobin 8.8 (*)    HCT 28.7 (*)    MCV 75.7 (*)    MCH 23.2 (*)    RDW 17.2 (*)    Neutrophils Relative % 36 (*)    Neutro Abs 1.5 (*)    Lymphocytes Relative 48 (*)    All other components within normal limits  URINALYSIS, ROUTINE W REFLEX MICROSCOPIC -  Abnormal; Notable for the following:    Specific Gravity, Urine >1.030 (*)    Bilirubin Urine SMALL (*)    All other components within normal limits  URINE RAPID DRUG SCREEN (HOSP PERFORMED) - Abnormal; Notable for the following:    Opiates POSITIVE (*)    Benzodiazepines POSITIVE (*)    All other components within normal limits  URINE CULTURE  COMPREHENSIVE METABOLIC PANEL  URINE MICROSCOPIC-ADD ON   HEMOGLOBIN  Date Value Ref Range Status  04/06/2015 8.8* 12.0 - 15.0 g/dL Final  03/19/2015 9.0* 12.0 - 15.0 g/dL Final  02/24/2015 8.0* 12.0 - 15.0 g/dL Final  02/23/2015 7.3* 12.0 - 15.0 g/dL Final    Comment:    DELTA CHECK NOTED REPEATED TO VERIFY   02/18/2015 8.1* 12.2 - 16.2 g/dL Final  02/17/2015 8.7* 12.2 - 16.2 g/dL Final  07/18/2014 10.5* 12.2 - 16.2 g/dL Final  05/09/2014 11.9* 12.2 - 16.2 g/dL Final      Imaging Review No results found.   EKG Interpretation None      MDM   Final diagnoses:  Generalized abdominal pain  Pseudoseizure      Nonspecific abdominal pain, which is recurrent. Doubt colitis, pancreatitis, metabolic instability or impending vascular collapse.  Nursing Notes Reviewed/ Care Coordinated, and agree without changes. Applicable Imaging Reviewed.  Interpretation of Laboratory Data incorporated into ED treatment  Plan: Evaluation by hospitalist in the emergency department to consider admission.  Daleen Bo, MD 04/06/15 [place], MD 04/06/15 [phone]

## 2015-04-06 NOTE — ED Notes (Signed)
Mother hit call bell and asked for a nurse, upon entering the room pt appeared to be dry heaving with mom holding her head, after about 30 sec of try to talk to pt her eyes rolled back and she continues to make heaving sounds. Pt proceeded to start clinching her hands and feet. Pt was not responding to stimuli. 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 Dr Eulis Foster at bedside

## 2015-04-06 NOTE — ED Notes (Signed)
Pt now c/o epigastric pain, denies lower abd pain. Pt denies nausea, no vomiting noted. Pt crying, no relief despite multiple medication. MD aware

## 2015-04-06 NOTE — ED Notes (Signed)
RLQ abdominal pain since yesterday.  States the pain is so severe "I have passed out".  History of abdominal pain. C/o vomiting and diarrhea.

## 2015-04-06 NOTE — Discharge Instructions (Signed)

## 2015-04-06 NOTE — ED Notes (Signed)
Rn attempted to give pt tramadol pt refused to take it states "There is a drug that trigger my seizures I don't remember what it is"

## 2015-04-06 NOTE — ED Notes (Signed)
Unsuccessful IV attempts x 2, talked to MD states we can proceed with labs drawn by phlebotomy and wait on gaining IV access pending lab results.

## 2015-04-06 NOTE — Consult Note (Signed)
[facility] Medical Consultation  Penny [name] GBT:[id number] DOB: 06/05/1986 DOA: 04/06/2015 PCP: [name], MD   Requesting physician: Dr. Eulis Foster  Date of consultation: 5.1.2016 Reason for consultation: Pain controlled, nausea and vomiting.  Impression/Recommendations Abdominal pain, right lower quadrant: - She had a previous CT scan on 03/18/2015 with a 4 cm cyst. She doesn't have a white count, hemoglobin is stable, and her saturations have remaining stable. She denies any vaginal discharge or any burning when she urinates, and her UA was not significant for bacteria and WBC.  -UDS is positive for opiates and benzos, and her abdominal exam is completely benign. She has no rebound or guarding either in the right lower quadrant or right upper quadrant. Just some mild what she describes spasms in the right lower quadrant. I recommend controlling her pain and following up with her gastroenterologist in outpatient. She doesn't have any rebound guarding there is no leukocytosis she's not having any bright red blood per rectum. Her CVA is negative. Her previous CT scan did not show a stone. She's had no diarrhea. I will get an abdominal x-ray, but she does relate that her last bowel movement was this morning sweat duct is an obstruction. It is unlikely a mesenteric ischemic event due to her age, does not have a history of diabetes or coronary or peripheral vascular disease. There is no history of Crohn's disease. Iron deficiency anemia, there is no history of bleeding which rules out fissure or external hemorrhoids.  Iron Deficiency anemia: She is a menstruating female, which my be the cause of her iron deficiency anemia. I would also recommend starting her on ferrous sulfate 3 times a day.  Depression with anxiety: Continue current home medications.  Chief Complaint: abdominal pain  HPI:  29 year old female, past medical history of anxiety and depression, suicidal ideation, morbid  obesity status post gastric bypass history of opiate addiction, menorrhagia  chronic cystitis and iron deficiency anemia recently seen in the ED for abdominal pain nausea and vomiting a CT scan was done at that time that showed a 4 cm cyst. She relates her symptoms started 3 weeks ago and is continue to have them since then. She was referred to her GI doctor which she has an appointment on 04/08/2015. She has been evaluated with an EGD in the past. She is currently on Protonix and has taken 1 or 2 doses of naproxen but has, had the EGD showed a jejunal ulcer. She relates her pain is so severe, she relates some nausea and vomiting but no blood. She denies any vaginal discharge or any bright red blood per rectum. Her pain does not radiate. Nothing makes it better worst. There is no aggravating or improving factors except for narcotics.  In the ED: Basic metabolic panel was done that was within normal limits his CBC does not show a blood cell count she has remained afebrile her UA is not showing signs of urinary tract infection, her UDS was positive for benzos and opiates.  Review of Systems:  Constitutional:  No weight loss, night sweats, Fevers, chills, fatigue.  HEENT:  No headaches, Difficulty swallowing,Tooth/dental problems,Sore throat,  No sneezing, itching, ear ache, nasal congestion, post nasal drip,  Cardio-vascular:  No chest pain, Orthopnea, PND, swelling in lower extremities, anasarca, dizziness, palpitations  GI:  No heartburn, indigestion, change in bowel habits, loss of appetite  Resp:  No shortness of breath with exertion or at rest. No excess mucus, no productive cough, No non-productive cough, No coughing  up of blood.No change in color of mucus.No wheezing.No chest wall deformity  Skin:  no rash or lesions.  GU:  no dysuria, change in color of urine, no urgency or frequency. No flank pain.  Musculoskeletal:  No joint pain or swelling. No decreased range of motion. No back pain.   Psych:  No change in mood or affect. No depression or anxiety. No memory loss.  Past Medical History  Diagnosis Date  . Migraines   . Interstitial cystitis   . IUD FEB 2010  . Gastritis JULY 2011  . Depression   . 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 Fatty liver   . Obesity (BMI 30-39.9) 2011 228 LBS BMI 36.8  . Iron deficiency anemia 07/23/2010  . Irritable bowel syndrome 2012 DIARRHEA    JUN 2012 TTG IgA 14.9  . Elevated liver enzymes JUL 2011ALK PHOS 111-127 AST  143-267 ALT  213-321T BILI 0.6  ALB  3.7-4.06 Jun 2011 ALK PHOS 118 AST 24 ALT 42 T BILI 0.4 ALB 3.9  . Chronic daily headache   . Ovarian cyst   . PONV (postoperative nausea and vomiting)   . ADD (attention deficit disorder)   . RLQ abdominal pain 07/18/2013  . Menorrhagia 07/18/2013  . Anxiety   . Potassium (K) deficiency   . Dysmenorrhea 09/18/2013  . Stress 09/18/2013  . Patient desires pregnancy 09/18/2013  . Pregnant 12/25/2013  . Blood transfusion without reported diagnosis   . Heart murmur   . Gastric bypass status for obesity   . 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 Dysrhythmia   . Sciatica of left side 11/14/2014  . Fibromyalgia   . 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 Gab  2007    in [place]-POUCH 5 CM  . Cholecystectomy  2005    biliary dyskinesia  . Cath self every nite      for sodium bicarb injection (discontinued 2013)  . Colonoscopy  JUN 2012 ABD PN/DIARRHEA WITH PROPOFOL    NL COLON  . Upper gastrointestinal endoscopy  JULY 2011 NAUSEA-D125,V6, PH 25    Bx; GASTRITIS, POUCH-5 CM LONG  . Dilation and curettage of uterus    . Tonsillectomy    . Gastric bypass  06/2006  . Savory dilation  06/20/2012    ZOX:WRUE gastritis/Ulcer in the mid jejunum  . Tonsillectomy and adenoidectomy    . Esophagogastroduodenoscopy    . Wisdom tooth extraction    . Repair vaginal cuff N/A 07/30/2014    Procedure: REPAIR  VAGINAL CUFF;  Surgeon: Mora Bellman, MD;  Location: [facility];  Service: Gynecology;  Laterality: N/A;  . Hysteroscopy w/d&c N/A 09/12/2014    Procedure: DILATATION AND CURETTAGE /HYSTEROSCOPY;  Surgeon: Jonnie Kind, MD;  Location: [facility];  Service: Gynecology;  Laterality: N/A;   Social History:  reports that she has been smoking Cigarettes.  She has a 3 pack-year smoking history. She has never used smokeless tobacco. She reports that she does not drink alcohol or use illicit drugs.  Allergies  Allergen Reactions  . Gabapentin Other (See Comments)    Pt states that she was unresponsive after taking this medication, but her vitals remained stable.    . Metoclopramide Hcl Anxiety and Other (See Comments)    Pt states that she felt like she was trapped in a box, and could not get out.  Pt also states  that she had temporary loss of movement, weakness, and tingling.    . Codeine Nausea And Vomiting  . Zofran [Ondansetron] Other (See Comments)    Migraines   . Latex Rash  . Tape Itching and Rash    Please use paper tape   Family History  Problem Relation Age of Onset  . Hemochromatosis Maternal Grandmother   . Migraines Maternal Grandmother   . Cancer Maternal Grandmother   . Breast cancer Maternal Grandmother   . Hypertension Father   . Diabetes Father   . Coronary artery disease Father   . Migraines Paternal Grandmother   . Breast cancer Paternal Grandmother   . Cancer Mother     breast  . Hemochromatosis Mother   . Breast cancer Mother   . Coronary artery disease Paternal Grandfather     Prior to Admission medications   Medication Sig Start Date End Date Taking? Authorizing Provider  clonazePAM (KLONOPIN) 0.5 MG tablet Take 1 tablet (0.5 mg total) by mouth 3 (three) times daily. Patient taking differently: Take 0.5 mg by mouth 3 (three) times daily as needed for anxiety.  03/03/15  Yes Kathrynn Ducking, MD  cyclobenzaprine (FLEXERIL) 5 MG tablet TAKE 1 TABLET BY MOUTH EVERY  EIGHT HOURS AS NEEDED FOR MUSCLE SPASMS. 03/17/15  Yes Kathrynn Ducking, MD  DULoxetine (CYMBALTA) 30 MG capsule Take 1-2 capsules (30-60 mg total) by mouth 2 (two) times daily. Take 1 capsule (30 mg) every morning and 2 capsules (60 mg) every night 02/24/15  Yes Silver Huguenin Elgergawy, MD  esomeprazole (NEXIUM) 40 MG capsule Take 1 capsule (40 mg total) by mouth daily. 03/19/15  Yes Evelina Bucy, MD  ferrous sulfate 325 (65 FE) MG tablet Take 1 tablet (325 mg total) by mouth 2 (two) times daily with a meal. 02/06/15  Yes Okey Regal, PA-C  HYDROcodone-acetaminophen (NORCO/VICODIN) 5-325 MG per tablet Take 1 tablet by mouth every 6 (six) hours as needed for moderate pain. 04/02/15  Yes Kathyrn Drown, MD  promethazine (PHENERGAN) 12.5 MG tablet Take 1 tablet (12.5 mg total) by mouth every 6 (six) hours as needed for nausea or vomiting. 02/24/15  Yes Albertine Patricia, MD  topiramate (TOPAMAX) 100 MG tablet Take 1 tablet (100 mg total) by mouth at bedtime. 02/18/15  Yes Kathrynn Ducking, MD  zolpidem (AMBIEN) 5 MG tablet Take 1 tablet (5 mg total) by mouth at bedtime as needed for sleep. 12/26/14  Yes Kathyrn Drown, MD  busPIRone (BUSPAR) 7.5 MG tablet Take 1 tablet (7.5 mg total) by mouth at bedtime. Patient not taking: Reported on 04/06/2015 03/03/15   Kathrynn Ducking, MD  Clobetasol Prop Emollient Base 0.05 % emollient cream Apply small amount to affected area BID Patient not taking: Reported on 04/06/2015 02/18/15   Christin Fudge, CNM  ketorolac (TORADOL) 10 MG tablet Take 1 tablet (10 mg total) by mouth every 8 (eight) hours as needed for moderate pain. Patient not taking: Reported on 03/19/2015 02/12/15   Kathrynn Ducking, MD  promethazine (PHENERGAN) 25 MG tablet Take 1 tablet (25 mg total) by mouth every 6 (six) hours as needed for nausea or vomiting. Patient not taking: Reported on 04/06/2015 03/19/15   Evelina Bucy, MD  sucralfate (CARAFATE) 1 G tablet Take 1 tablet (1 g total) by mouth 4 (four)  times daily. Patient not taking: Reported on 04/06/2015 03/19/15   Evelina Bucy, MD   Physical Exam: Blood pressure 125/64, pulse 94, temperature 98 F (36.7 C), temperature  source Oral, resp. rate 14, height $RemoveBe'5\' 6"'vWrTfAYsJ$  (1.676 m), weight 108.41 kg (239 lb), last menstrual period 03/19/2015, SpO2 99 %, not currently breastfeeding. Filed Vitals:   04/06/15 1500  BP: 125/64  Pulse: 94  Temp:   Resp: 14     General:  She is awake alert and oriented 3 per emotional  Eyes: Anicteric  ENT: Mucous membranes are moist  Neck: Trachea is midline no JVD  Cardiovascular: Regular rate and rhythm no appreciated murmurs rubs gallops  Respiratory: Good air movement and clear to auscultation  Abdomen: She has positive bowel sounds some mild tenderness in the right lower quadrant no rebound or guarding Murphy sign. Her abdomen is soft and no guarding  Skin:  no rashes or ulcerations  Musculoskeletal: Intact  Psychiatric: Very volatile labile emotion  Neurologic: She is awake alert and oriented 4 coherent and fluent language and nonfocal  Labs on Admission:  Basic Metabolic Panel:  Recent Labs Lab 04/06/15 1019  NA 139  K 3.6  CL 110  CO2 23  GLUCOSE 94  BUN 11  CREATININE 0.58  CALCIUM 9.0   Liver Function Tests:  Recent Labs Lab 04/06/15 1019  AST 22  ALT 17  ALKPHOS 89  BILITOT 0.5  PROT 7.2  ALBUMIN 4.2   No results for input(s): LIPASE, AMYLASE in the last 168 hours. No results for input(s): AMMONIA in the last 168 hours. CBC:  Recent Labs Lab 04/06/15 1019  WBC 4.1  NEUTROABS 1.5*  HGB 8.8*  HCT 28.7*  MCV 75.7*  PLT 267   Cardiac Enzymes: No results for input(s): CKTOTAL, CKMB, CKMBINDEX, TROPONINI in the last 168 hours. BNP: Invalid input(s): POCBNP CBG: No results for input(s): GLUCAP in the last 168 hours.  Radiological Exams on Admission: No results found.  EKG: Independently reviewed. none  Time spent: 80 min  Charlynne Cousins [facility] Pager [phone]  If 7PM-7AM, please contact night-coverage [email or web address] Password [email or web address] 04/06/2015, 3:56 PM

## 2015-04-07 ENCOUNTER — Inpatient Hospital Stay (HOSPITAL_COMMUNITY)
Admission: EM | Admit: 2015-04-07 | Discharge: 2015-04-10 | DRG: 379 | Disposition: A | Discharge status: Home / Self Care | Payer: 59 | Attending: Internal Medicine | Admitting: Internal Medicine

## 2015-04-07 ENCOUNTER — Encounter (HOSPITAL_COMMUNITY): Payer: Self-pay | Admitting: *Deleted

## 2015-04-07 ENCOUNTER — Inpatient Hospital Stay (HOSPITAL_COMMUNITY): Payer: 59

## 2015-04-07 ENCOUNTER — Emergency Department (HOSPITAL_COMMUNITY): Payer: 59

## 2015-04-07 DIAGNOSIS — Z833 Family history of diabetes mellitus: Secondary | ICD-10-CM

## 2015-04-07 DIAGNOSIS — D638 Anemia in other chronic diseases classified elsewhere: Secondary | ICD-10-CM | POA: Diagnosis present

## 2015-04-07 DIAGNOSIS — Z9884 Bariatric surgery status: Secondary | ICD-10-CM

## 2015-04-07 DIAGNOSIS — Z8249 Family history of ischemic heart disease and other diseases of the circulatory system: Secondary | ICD-10-CM

## 2015-04-07 DIAGNOSIS — R1013 Epigastric pain: Secondary | ICD-10-CM

## 2015-04-07 DIAGNOSIS — F329 Major depressive disorder, single episode, unspecified: Secondary | ICD-10-CM | POA: Diagnosis present

## 2015-04-07 DIAGNOSIS — Z7289 Other problems related to lifestyle: Secondary | ICD-10-CM

## 2015-04-07 DIAGNOSIS — R748 Abnormal levels of other serum enzymes: Secondary | ICD-10-CM

## 2015-04-07 DIAGNOSIS — K760 Fatty (change of) liver, not elsewhere classified: Secondary | ICD-10-CM | POA: Diagnosis present

## 2015-04-07 DIAGNOSIS — F17210 Nicotine dependence, cigarettes, uncomplicated: Secondary | ICD-10-CM | POA: Diagnosis present

## 2015-04-07 DIAGNOSIS — Z9049 Acquired absence of other specified parts of digestive tract: Secondary | ICD-10-CM | POA: Diagnosis present

## 2015-04-07 DIAGNOSIS — D509 Iron deficiency anemia, unspecified: Secondary | ICD-10-CM | POA: Diagnosis present

## 2015-04-07 DIAGNOSIS — R109 Unspecified abdominal pain: Secondary | ICD-10-CM

## 2015-04-07 DIAGNOSIS — R1310 Dysphagia, unspecified: Secondary | ICD-10-CM

## 2015-04-07 DIAGNOSIS — M797 Fibromyalgia: Secondary | ICD-10-CM | POA: Diagnosis present

## 2015-04-07 DIAGNOSIS — K920 Hematemesis: Principal | ICD-10-CM | POA: Diagnosis present

## 2015-04-07 DIAGNOSIS — F445 Conversion disorder with seizures or convulsions: Secondary | ICD-10-CM | POA: Diagnosis present

## 2015-04-07 DIAGNOSIS — R1110 Vomiting, unspecified: Secondary | ICD-10-CM

## 2015-04-07 DIAGNOSIS — Z803 Family history of malignant neoplasm of breast: Secondary | ICD-10-CM | POA: Diagnosis not present

## 2015-04-07 DIAGNOSIS — R112 Nausea with vomiting, unspecified: Secondary | ICD-10-CM | POA: Diagnosis present

## 2015-04-07 DIAGNOSIS — K289 Gastrojejunal ulcer, unspecified as acute or chronic, without hemorrhage or perforation: Secondary | ICD-10-CM | POA: Diagnosis present

## 2015-04-07 DIAGNOSIS — Z6838 Body mass index (BMI) 38.0-38.9, adult: Secondary | ICD-10-CM | POA: Diagnosis not present

## 2015-04-07 DIAGNOSIS — R110 Nausea: Secondary | ICD-10-CM

## 2015-04-07 LAB — LIPASE, BLOOD: Lipase: 25 U/L (ref 22–51)

## 2015-04-07 LAB — TYPE AND SCREEN
ABO/RH(D): A POS
Antibody Screen: NEGATIVE

## 2015-04-07 LAB — ACETAMINOPHEN LEVEL: Acetaminophen (Tylenol), Serum: <10 ug/mL — ABNORMAL LOW (ref 10–30)

## 2015-04-07 LAB — RAPID URINE DRUG SCREEN, HOSP PERFORMED
Amphetamines: NOT DETECTED
BARBITURATES: NOT DETECTED
Benzodiazepines: POSITIVE — AB
Cocaine: NOT DETECTED
Opiates: POSITIVE — AB
TETRAHYDROCANNABINOL: NOT DETECTED

## 2015-04-07 LAB — I-STAT CG4 LACTIC ACID, ED: Lactic Acid, Venous: 0.55 mmol/L (ref 0.5–2.0)

## 2015-04-07 LAB — PREGNANCY, URINE: Preg Test, Ur: NEGATIVE

## 2015-04-07 LAB — CLOSTRIDIUM DIFFICILE BY PCR: Toxigenic C. Difficile by PCR: NEGATIVE

## 2015-04-07 LAB — AMMONIA: Ammonia: 19 umol/L (ref 9–35)

## 2015-04-07 MED ORDER — DULOXETINE HCL 60 MG PO CPEP
60.0000 mg | ORAL_CAPSULE | Freq: Every day | ORAL | Status: DC
  Administered 2015-04-07 – 2015-04-09 (×3): 60 mg via ORAL
  Filled 2015-04-07 (×4): qty 1

## 2015-04-07 MED ORDER — DULOXETINE HCL 30 MG PO CPEP: 30.0000 mg | ORAL_CAPSULE | Freq: Two times a day (BID) | ORAL | Status: DC

## 2015-04-07 MED ORDER — HYDROCODONE-ACETAMINOPHEN 5-325 MG PO TABS
1.0000 | ORAL_TABLET | ORAL | Status: DC | PRN
  Administered 2015-04-08 (×3): 2 via ORAL
  Administered 2015-04-08: 1 via ORAL
  Administered 2015-04-09 – 2015-04-10 (×7): 2 via ORAL
  Filled 2015-04-07: qty 1
  Filled 2015-04-07 (×10): qty 2

## 2015-04-07 MED ORDER — PANTOPRAZOLE SODIUM 40 MG IV SOLR
INTRAVENOUS | Status: AC
  Filled 2015-04-07: qty 160

## 2015-04-07 MED ORDER — ACETAMINOPHEN 325 MG PO TABS: 650.0000 mg | ORAL_TABLET | Freq: Four times a day (QID) | ORAL | Status: DC | PRN

## 2015-04-07 MED ORDER — KETOROLAC TROMETHAMINE 15 MG/ML IJ SOLN: 15.0000 mg | Freq: Four times a day (QID) | INTRAMUSCULAR | Status: DC | PRN

## 2015-04-07 MED ORDER — SODIUM CHLORIDE 0.9 % IV BOLUS (SEPSIS)
1000.0000 mL | Freq: Once | INTRAVENOUS | Status: AC
  Administered 2015-04-07: 1000 mL via INTRAVENOUS

## 2015-04-07 MED ORDER — POTASSIUM CHLORIDE IN NACL 20-0.9 MEQ/L-% IV SOLN
INTRAVENOUS | Status: DC
  Administered 2015-04-07 – 2015-04-09 (×4): via INTRAVENOUS

## 2015-04-07 MED ORDER — SODIUM CHLORIDE 0.9 % IV SOLN
80.0000 mg | Freq: Once | INTRAVENOUS | Status: AC
  Administered 2015-04-07: 80 mg via INTRAVENOUS
  Filled 2015-04-07: qty 80

## 2015-04-07 MED ORDER — ACETAMINOPHEN 650 MG RE SUPP: 650.0000 mg | Freq: Four times a day (QID) | RECTAL | Status: DC | PRN

## 2015-04-07 MED ORDER — PANTOPRAZOLE SODIUM 40 MG IV SOLR
8.0000 mg/h | INTRAVENOUS | Status: DC
  Administered 2015-04-07 – 2015-04-08 (×2): 8 mg/h via INTRAVENOUS
  Filled 2015-04-07 (×6): qty 80

## 2015-04-07 MED ORDER — SODIUM CHLORIDE 0.9 % IV SOLN: INTRAVENOUS | Status: DC

## 2015-04-07 MED ORDER — PROMETHAZINE HCL 12.5 MG PO TABS: 12.5000 mg | ORAL_TABLET | Freq: Four times a day (QID) | ORAL | Status: DC | PRN

## 2015-04-07 MED ORDER — GI COCKTAIL ~~LOC~~
30.0000 mL | Freq: Three times a day (TID) | ORAL | Status: DC | PRN
  Administered 2015-04-08: 30 mL via ORAL
  Filled 2015-04-07 (×2): qty 30

## 2015-04-07 MED ORDER — HYDROMORPHONE HCL 1 MG/ML IJ SOLN
0.5000 mg | Freq: Four times a day (QID) | INTRAMUSCULAR | Status: DC | PRN
  Administered 2015-04-07 – 2015-04-08 (×2): 0.5 mg via INTRAVENOUS
  Filled 2015-04-07 (×2): qty 1

## 2015-04-07 MED ORDER — PROMETHAZINE HCL 25 MG/ML IJ SOLN
6.2500 mg | INTRAMUSCULAR | Status: DC | PRN
  Administered 2015-04-07: 6.25 mg via INTRAVENOUS
  Filled 2015-04-07: qty 1

## 2015-04-07 MED ORDER — IOHEXOL 300 MG/ML  SOLN
100.0000 mL | Freq: Once | INTRAMUSCULAR | Status: AC | PRN
  Administered 2015-04-07: 100 mL via INTRAVENOUS

## 2015-04-07 MED ORDER — DULOXETINE HCL 30 MG PO CPEP
30.0000 mg | ORAL_CAPSULE | Freq: Every day | ORAL | Status: DC
  Administered 2015-04-08 – 2015-04-10 (×3): 30 mg via ORAL
  Filled 2015-04-07 (×5): qty 1

## 2015-04-07 MED ORDER — IOHEXOL 300 MG/ML  SOLN
25.0000 mL | Freq: Once | INTRAMUSCULAR | Status: AC | PRN
  Administered 2015-04-07: 25 mL via ORAL

## 2015-04-07 MED ORDER — TOPIRAMATE 100 MG PO TABS
100.0000 mg | ORAL_TABLET | Freq: Every day | ORAL | Status: DC
  Administered 2015-04-08 – 2015-04-09 (×2): 100 mg via ORAL
  Filled 2015-04-07 (×4): qty 1

## 2015-04-07 MED ORDER — PANTOPRAZOLE SODIUM 40 MG IV SOLR: 40.0000 mg | Freq: Two times a day (BID) | INTRAVENOUS | Status: DC

## 2015-04-07 MED ORDER — CLONAZEPAM 0.5 MG PO TABS
0.5000 mg | ORAL_TABLET | Freq: Three times a day (TID) | ORAL | Status: DC | PRN
  Administered 2015-04-07 – 2015-04-09 (×3): 0.5 mg via ORAL
  Filled 2015-04-07 (×3): qty 1

## 2015-04-07 MED ORDER — PROCHLORPERAZINE EDISYLATE 5 MG/ML IJ SOLN
10.0000 mg | Freq: Once | INTRAMUSCULAR | Status: AC
  Administered 2015-04-07: 10 mg via INTRAVENOUS
  Filled 2015-04-07: qty 2

## 2015-04-07 MED ORDER — PROMETHAZINE HCL 25 MG/ML IJ SOLN
12.5000 mg | Freq: Four times a day (QID) | INTRAMUSCULAR | Status: DC | PRN
Start: 2015-04-07 — End: 2015-04-07

## 2015-04-07 MED ORDER — HYDROCODONE-ACETAMINOPHEN 5-325 MG PO TABS
1.0000 | ORAL_TABLET | ORAL | Status: DC | PRN
  Filled 2015-04-07: qty 1

## 2015-04-07 NOTE — H&P (Signed)
History and Physical  Penny [name] Attar NUU:[id number] DOB: 04-Jun-1986 DOA: 04/07/2015   PCP: [name], MD   Chief Complaint: hematemesis, intractable N/V  HPI:  29 year old female with a history of pseudoseizure, depression, anxiety, gastric bypass presents with one-month history of lower abdominal pain. The patient presented to the emergency department on 04/06/2015 with nausea, vomiting, and abdominal pain. The patient was sent home with ejection to follow-up with gastroenterology in the outpatient setting. The patient apparently has an appointment to see Dr. Darrick Penna on 04/08/2015. She states that she has been having vomiting for approximately 3 days. However on the morning of admission, the patient developed hematemesis. She stated that she alternately 5 times with coffee grounds material and occasionally saw some blood.  In addition, the patient has been complaining of diarrhea for the last 24 hours. She states that she has had 10 bowel movements without any hematochezia or melena. Interestingly, it appears that the patient changes her story even during my interview. Initially, the patient stated that she had had only one episode of hematemesis and had only 24 hours duration of nausea and vomiting. The patient took some hydrocodone which did not help. She denies any other NSAID use. She denies any fevers, chills, chest pain, shortness breath, hemoptysis, dysuria, hematuria. She denies any recent travels or sick contacts. Since arrival to the emergency department, the patient has not had any bowel movements or episodes of emesis. The patient was afebrile and hemodynamically stable. BMP was unremarkable and CBC showed stable hemoglobin of 9.2. Lactic acid was 0.55. However the patient did have elevated hepatic enzymes with AST 170, ALT 163, total bilirubin 0.5, alk phosphatase 138. The patient was given 2 L normal saline and Compazine. Admission was requested for further workup. Lipase  25 Assessment/Plan: Intractable nausea, vomiting, diarrhea with hematemesis -I have requested gastroenterology consultation -Trial of clear liquid as the patient feels that she can take some fluid -04/07/2015 CT abdomen and pelvis negative for any acute findings -I have requested that the patient notify the nursing staff with each episode of emesis or bowel movement -Stool for C. difficile PCR -Stool lactoferrin -Hemoccult stool -Hemoglobin is stable at 9.2; patient's baseline hemoglobin is 9 -Lactic acid is 0.55 -Repeat urine drug screen -04/06/2015 urinalysis is negative for pyuria or hematuria -Lipase 25 -IVF Transaminasemia -CT abdomen and pelvis shows common bile duct 11 mm--she is status post cholecystectomy -Hepatitis B surface antigen -Hepatitis C antibody -HIV antibody -Trend -RUQ [name] Morbid obesity -BMI 38 Impaired glucose tolerance -01/15/2015 hemoglobin A1c 5.7       Past Medical History  Diagnosis Date  . Migraines   . Interstitial cystitis   . IUD FEB 2010  . Gastritis JULY 2011  . Depression   . 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 Fatty liver   . Obesity (BMI 30-39.9) 2011 228 LBS BMI 36.8  . Iron deficiency anemia 07/23/2010  . Irritable bowel syndrome 2012 DIARRHEA    JUN 2012 TTG IgA 14.9  . Elevated liver enzymes JUL 2011ALK PHOS 111-127 AST  143-267 ALT  213-321T BILI 0.6  ALB  3.7-4.06 Jun 2011 ALK PHOS 118 AST 24 ALT 42 T BILI 0.4 ALB 3.9  . Chronic daily headache   . Ovarian cyst   . PONV (postoperative nausea and vomiting)   . ADD (attention deficit disorder)   . RLQ abdominal pain 07/18/2013  . Menorrhagia 07/18/2013  . Anxiety   .  Potassium (K) deficiency   . Dysmenorrhea 09/18/2013  . Stress 09/18/2013  . Patient desires pregnancy 09/18/2013  . Pregnant 12/25/2013  . Blood transfusion without reported diagnosis   . Heart murmur   . Gastric bypass status for obesity   . 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 Dysrhythmia   .  Sciatica of left side 11/14/2014  . Fibromyalgia   . 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 Gab  2007    in [place]-POUCH 5 CM  . Cholecystectomy  2005    biliary dyskinesia  . Cath self every nite      for sodium bicarb injection (discontinued 2013)  . Colonoscopy  JUN 2012 ABD PN/DIARRHEA WITH PROPOFOL    NL COLON  . Upper gastrointestinal endoscopy  JULY 2011 NAUSEA-D125,V6, PH 25    Bx; GASTRITIS, POUCH-5 CM LONG  . Dilation and curettage of uterus    . Tonsillectomy    . Gastric bypass  06/2006  . Savory dilation  06/20/2012    IEP:PIRJ gastritis/Ulcer in the mid jejunum  . Tonsillectomy and adenoidectomy    . Esophagogastroduodenoscopy    . Wisdom tooth extraction    . Repair vaginal cuff N/A 07/30/2014    Procedure: REPAIR VAGINAL CUFF;  Surgeon: Catalina Antigua, MD;  Location: [facility];  Service: Gynecology;  Laterality: N/A;  . Hysteroscopy w/d&c N/A 09/12/2014    Procedure: DILATATION AND CURETTAGE /HYSTEROSCOPY;  Surgeon: Tilda Burrow, MD;  Location: [facility];  Service: Gynecology;  Laterality: N/A;   Social History:  reports that she has been smoking Cigarettes.  She has a 3 pack-year smoking history. She has never used smokeless tobacco. She reports that she does not drink alcohol or use illicit drugs.   Family History  Problem Relation Age of Onset  . Hemochromatosis Maternal Grandmother   . Migraines Maternal Grandmother   . Cancer Maternal Grandmother   . Breast cancer Maternal Grandmother   . Hypertension Father   . Diabetes Father   . Coronary artery disease Father   . Migraines Paternal Grandmother   . Breast cancer Paternal Grandmother   . Cancer Mother     breast  . Hemochromatosis Mother   . Breast cancer Mother   . Coronary artery disease Paternal Grandfather      Allergies  Allergen Reactions  . Gabapentin Other (See Comments)    Pt states that she was unresponsive  after taking this medication, but her vitals remained stable.    . Metoclopramide Hcl Anxiety and Other (See Comments)    Pt states that she felt like she was trapped in a box, and could not get out.  Pt also states that she had temporary loss of movement, weakness, and tingling.    . Codeine Nausea And Vomiting  . Zofran [Ondansetron] Other (See Comments)    Migraines   . Latex Rash  . Tape Itching and Rash    Please use paper tape      Prior to Admission medications   Medication Sig Start Date End Date Taking? Authorizing Provider  clonazePAM (KLONOPIN) 0.5 MG tablet Take 1 tablet (0.5 mg total) by mouth 3 (three) times daily. Patient taking differently: Take 0.5 mg by mouth 3 (three) times daily as needed for anxiety.  03/03/15  Yes York Spaniel, MD  cyclobenzaprine (FLEXERIL) 5 MG tablet TAKE 1 TABLET BY MOUTH EVERY EIGHT HOURS AS NEEDED FOR MUSCLE SPASMS. 03/17/15  Yes York Spaniel, MD  DULoxetine (CYMBALTA) 30 MG capsule Take 1-2 capsules (30-60 mg total) by mouth 2 (two) times daily. Take 1 capsule (30 mg) every morning and 2 capsules (60 mg) every night 02/24/15  Yes Leana Roe Elgergawy, MD  esomeprazole (NEXIUM) 40 MG capsule Take 1 capsule (40 mg total) by mouth daily. 03/19/15  Yes Elwin Mocha, MD  ferrous sulfate 325 (65 FE) MG tablet Take 1 tablet (325 mg total) by mouth 2 (two) times daily with a meal. 02/06/15  Yes Eyvonne Mechanic, PA-C  oxyCODONE-acetaminophen (PERCOCET) 5-325 MG per tablet Take 1 tablet by mouth every 4 (four) hours as needed for severe pain. 04/06/15  Yes Mancel Bale, MD  promethazine (PHENERGAN) 12.5 MG tablet Take 1 tablet (12.5 mg total) by mouth every 6 (six) hours as needed for nausea or vomiting. 02/24/15  Yes Starleen Arms, MD  topiramate (TOPAMAX) 100 MG tablet Take 1 tablet (100 mg total) by mouth at bedtime. 02/18/15  Yes York Spaniel, MD  zolpidem (AMBIEN) 5 MG tablet Take 1 tablet (5 mg total) by mouth at bedtime as needed for sleep.  12/26/14  Yes Babs Sciara, MD  Clobetasol Prop Emollient Base 0.05 % emollient cream Apply small amount to affected area BID Patient not taking: Reported on 04/06/2015 02/18/15   Jacklyn Shell, CNM  HYDROcodone-acetaminophen (NORCO/VICODIN) 5-325 MG per tablet Take 1 tablet by mouth every 6 (six) hours as needed for moderate pain. Patient not taking: Reported on 04/07/2015 04/02/15   Babs Sciara, MD  ketorolac (TORADOL) 10 MG tablet Take 1 tablet (10 mg total) by mouth every 8 (eight) hours as needed for moderate pain. Patient not taking: Reported on 03/19/2015 02/12/15   York Spaniel, MD  promethazine (PHENERGAN) 25 MG tablet Take 1 tablet (25 mg total) by mouth every 6 (six) hours as needed for nausea or vomiting. Patient not taking: Reported on 04/06/2015 03/19/15   Elwin Mocha, MD  sucralfate (CARAFATE) 1 G tablet Take 1 tablet (1 g total) by mouth 4 (four) times daily. Patient not taking: Reported on 04/06/2015 03/19/15   Elwin Mocha, MD    Review of Systems:  Constitutional:  No weight loss, night sweats, Fevers Head&Eyes: No headache.  No vision loss.  No eye pain or scotoma ENT:  No Difficulty swallowing,Tooth/dental problems,Sore throat,   Cardio-vascular:  No chest pain, Orthopnea, PND, swelling in lower extremities,  dizziness, palpitations  GI:  No  hematochezia, melena, heartburn, indigestion, Resp:  No shortness of breath with exertion or at rest. No cough. No coughing up of blood .No wheezing.No chest wall deformity  Skin:  no rash or lesions.  GU:  no dysuria, change in color of urine, no urgency or frequency. No flank pain.  Musculoskeletal:  No joint pain or swelling. No decreased range of motion. No back pain.  Psych:  No change in mood or affect.  Neurologic: No headache, no dysesthesia, no focal weakness, no vision loss. No syncope  Physical Exam: Filed Vitals:   04/07/15 0530 04/07/15 0600 04/07/15 0744 04/07/15 0955  BP: 114/68 109/64 110/68  94/56  Pulse: 73 74 62 66  Temp:      TempSrc:      Resp:   17 17  Height:      Weight:      SpO2: 99% 98% 100% 99%   General:  A&O x 3, NAD, nontoxic, pleasant/cooperative Head/Eye: No conjunctival hemorrhage, no icterus, [place]/AT, No nystagmus ENT:  No icterus,  No thrush, good dentition, no pharyngeal exudate Neck:  No masses, no lymphadenpathy, no bruits CV:  RRR, no rub, no gallop, no S3 Lung:  CTAB, good air movement, no wheeze, no rhonchi Abdomen: soft/epigastric pain without any rebound, +BS, nondistended, no peritoneal signs Ext: No cyanosis, No rashes, No petechiae, No lymphangitis, trace LE edema Neuro: CNII-XII intact, strength 4/5 in bilateral upper and lower extremities, no dysmetria  Labs on Admission:  Basic Metabolic Panel:  Recent Labs Lab 04/06/15 1019 04/07/15 0651  NA 139 141  K 3.6 3.8  CL 110 109  CO2 23 25  GLUCOSE 94 124*  BUN 11 8  CREATININE 0.58 0.48  CALCIUM 9.0 9.2   Liver Function Tests:  Recent Labs Lab 04/06/15 1019 04/07/15 0651  AST 22 170*  ALT 17 163*  ALKPHOS 89 138*  BILITOT 0.5 0.5  PROT 7.2 7.3  ALBUMIN 4.2 4.2    Recent Labs Lab 04/07/15 0705  LIPASE 25    Recent Labs Lab 04/07/15 0651  AMMONIA 19   CBC:  Recent Labs Lab 04/06/15 1019 04/07/15 0651  WBC 4.1 4.0  NEUTROABS 1.5* 3.3  HGB 8.8* 9.2*  HCT 28.7* 30.6*  MCV 75.7* 76.9*  PLT 267 278   Cardiac Enzymes: No results for input(s): CKTOTAL, CKMB, CKMBINDEX, TROPONINI in the last 168 hours. BNP: Invalid input(s): POCBNP CBG: No results for input(s): GLUCAP in the last 168 hours.  Radiological Exams on Admission: Ct Abdomen Pelvis W Contrast  04/07/2015   CLINICAL DATA:  One day history of nausea and vomiting with 1 week history of epigastric pain and diarrhea.  EXAM: CT ABDOMEN AND PELVIS WITH CONTRAST  TECHNIQUE: Multidetector CT imaging of the abdomen and pelvis was performed using the standard protocol following bolus administration of  intravenous contrast. Oral contrast was also administered.  CONTRAST:  25mL OMNIPAQUE IOHEXOL 300 MG/ML SOLN, OMNIPAQUE IOHEXOL 300 MG/ML SOLN  COMPARISON:  March 19, 2015  FINDINGS: There is atelectatic change in the left lung base. Lung bases otherwise clear.  Patient is status post gastric bypass procedure without complicating features. There is no wall thickening or perigastric fluid in the left upper quadrant region.  No focal liver lesions are identified. The gallbladder is absent. The distal common bile duct measures 11 mm, stable. There is no mass or calculus appreciable in the biliary ductal system.  Spleen, pancreas, and adrenals appear normal.  Kidneys bilaterally show no mass or hydronephrosis on either side. There is no renal or ureteral calculus on either side.  In the pelvis, the urinary bladder is midline with normal wall thickness. There is no pelvic mass or pelvic fluid collection. The appendix appears normal. Note that the recent right ovarian cyst is no longer appreciable.  There is no bowel obstruction. No free air or portal venous air. There is no ascites, adenopathy, or abscess in the abdomen or pelvis. There is no demonstrable abdominal aortic aneurysm. There are no blastic or lytic bone lesions.  IMPRESSION: Patient is status post gastric bypass procedure without complicating feature appreciable. Currently there is no bowel obstruction. No abscess. Appendix region appears normal.  Mild common bile duct prominence is stable and is probably secondary to post cholecystectomy state. No biliary duct mass or calculus seen.  Previously noted right ovarian cyst has resolved.  No renal or ureteral calculus.  No hydronephrosis.   Electronically Signed   By: Bretta Bang III M.D.   On: 04/07/2015 09:05  Time spent:60 minutes Code Status:   FULL Family Communication:   Mother at bedside   Brinton Brandel, DO  [facility] Pager [phone]  If 7PM-7AM, please contact  night-coverage [email or web address] Password [email or web address] 04/07/2015, 11:05 AM

## 2015-04-07 NOTE — Progress Notes (Signed)
Attempted to give pt her night medications again, but pt refused. Pt is asleep and resting comfortably. MD has given pt new medication orders for pain and advanced pt's diet.

## 2015-04-07 NOTE — ED Provider Notes (Signed)
Discussed with Dr. Carles Collet;   will admit.  Nat Christen, MD 04/07/15 1019

## 2015-04-07 NOTE — ED Notes (Signed)
Patient asking for pain medication but was falling asleep mid sentence.  Patient just received Compazine for nausea.

## 2015-04-07 NOTE — ED Notes (Signed)
Pt states she began throwing up blood 20 mins pta

## 2015-04-07 NOTE — ED Provider Notes (Signed)
CSN: [id number]     Arrival date & time 04/07/15  7056 History   First MD Initiated Contact with Patient 04/07/15 0534     Chief Complaint  Patient presents with  . Emesis     (Consider location/radiation/quality/duration/timing/severity/associated sxs/prior Treatment) Patient is a 29 y.o. female presenting with vomiting. The history is provided by the patient.  Emesis She vomited bright red blood at home just prior to coming to the emergency department. There is one episode of emesis which he states was about half blood and half emesis. She has been having epigastric pain without radiation. There has been diarrhea for the last several days. She been in emergency department yesterday with complaints of pain but no vomiting blood. Her significant other states that she has been very sleepy since going home and thinks that her color is perhaps slightly more pale than normal. She states she has a history of ulcers and has been seen by Dr. Darrick Penna of gastroenterology for this.  Past Medical History  Diagnosis Date  . Migraines   . Interstitial cystitis   . IUD FEB 2010  . Gastritis JULY 2011  . Depression   . 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 Fatty liver   . Obesity (BMI 30-39.9) 2011 228 LBS BMI 36.8  . Iron deficiency anemia 07/23/2010  . Irritable bowel syndrome 2012 DIARRHEA    JUN 2012 TTG IgA 14.9  . Elevated liver enzymes JUL 2011ALK PHOS 111-127 AST  143-267 ALT  213-321T BILI 0.6  ALB  3.7-4.06 Jun 2011 ALK PHOS 118 AST 24 ALT 42 T BILI 0.4 ALB 3.9  . Chronic daily headache   . Ovarian cyst   . PONV (postoperative nausea and vomiting)   . ADD (attention deficit disorder)   . RLQ abdominal pain 07/18/2013  . Menorrhagia 07/18/2013  . Anxiety   . Potassium (K) deficiency   . Dysmenorrhea 09/18/2013  . Stress 09/18/2013  . Patient desires pregnancy 09/18/2013  . Pregnant 12/25/2013  . Blood transfusion without reported diagnosis   . Heart murmur   . Gastric bypass  status for obesity   . 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 Dysrhythmia   . Sciatica of left side 11/14/2014  . Fibromyalgia   . 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 Gab  2007    in [place]-POUCH 5 CM  . Cholecystectomy  2005    biliary dyskinesia  . Cath self every nite      for sodium bicarb injection (discontinued 2013)  . Colonoscopy  JUN 2012 ABD PN/DIARRHEA WITH PROPOFOL    NL COLON  . Upper gastrointestinal endoscopy  JULY 2011 NAUSEA-D125,V6, PH 25    Bx; GASTRITIS, POUCH-5 CM LONG  . Dilation and curettage of uterus    . Tonsillectomy    . Gastric bypass  06/2006  . Savory dilation  06/20/2012    BVT:DYFE gastritis/Ulcer in the mid jejunum  . Tonsillectomy and adenoidectomy    . Esophagogastroduodenoscopy    . Wisdom tooth extraction    . Repair vaginal cuff N/A 07/30/2014    Procedure: REPAIR VAGINAL CUFF;  Surgeon: Catalina Antigua, MD;  Location: [facility];  Service: Gynecology;  Laterality: N/A;  . Hysteroscopy w/d&c N/A 09/12/2014    Procedure: DILATATION AND CURETTAGE /HYSTEROSCOPY;  Surgeon: Tilda Burrow, MD;  Location: [facility];  Service: Gynecology;  Laterality: N/A;  Family History  Problem Relation Age of Onset  . Hemochromatosis Maternal Grandmother   . Migraines Maternal Grandmother   . Cancer Maternal Grandmother   . Breast cancer Maternal Grandmother   . Hypertension Father   . Diabetes Father   . Coronary artery disease Father   . Migraines Paternal Grandmother   . Breast cancer Paternal Grandmother   . Cancer Mother     breast  . Hemochromatosis Mother   . Breast cancer Mother   . Coronary artery disease Paternal Grandfather    History  Substance Use Topics  . Smoking status: Current Every Day Smoker -- 0.50 packs/day for 6 years    Types: Cigarettes  . Smokeless tobacco: Never Used  . Alcohol Use: No   OB History    Gravida Para Term Preterm AB TAB SAB Ectopic  Multiple Living   '3 2 1 1 1  1   2     '$ Review of Systems  Gastrointestinal: Positive for vomiting.  All other systems reviewed and are negative.     Allergies  Gabapentin; Metoclopramide hcl; Codeine; Zofran; Latex; and Tape  Home Medications   Prior to Admission medications   Medication Sig Start Date End Date Taking? Authorizing Provider  busPIRone (BUSPAR) 7.5 MG tablet Take 1 tablet (7.5 mg total) by mouth at bedtime. Patient not taking: Reported on 04/06/2015 03/03/15   Kathrynn Ducking, MD  Clobetasol Prop Emollient Base 0.05 % emollient cream Apply small amount to affected area BID Patient not taking: Reported on 04/06/2015 02/18/15   Christin Fudge, CNM  clonazePAM (KLONOPIN) 0.5 MG tablet Take 1 tablet (0.5 mg total) by mouth 3 (three) times daily. Patient taking differently: Take 0.5 mg by mouth 3 (three) times daily as needed for anxiety.  03/03/15   Kathrynn Ducking, MD  cyclobenzaprine (FLEXERIL) 5 MG tablet TAKE 1 TABLET BY MOUTH EVERY EIGHT HOURS AS NEEDED FOR MUSCLE SPASMS. 03/17/15   Kathrynn Ducking, MD  DULoxetine (CYMBALTA) 30 MG capsule Take 1-2 capsules (30-60 mg total) by mouth 2 (two) times daily. Take 1 capsule (30 mg) every morning and 2 capsules (60 mg) every night 02/24/15   Silver Huguenin Elgergawy, MD  esomeprazole (NEXIUM) 40 MG capsule Take 1 capsule (40 mg total) by mouth daily. 03/19/15   Evelina Bucy, MD  ferrous sulfate 325 (65 FE) MG tablet Take 1 tablet (325 mg total) by mouth 2 (two) times daily with a meal. 02/06/15   Okey Regal, PA-C  HYDROcodone-acetaminophen (NORCO/VICODIN) 5-325 MG per tablet Take 1 tablet by mouth every 6 (six) hours as needed for moderate pain. 04/02/15   Kathyrn Drown, MD  ketorolac (TORADOL) 10 MG tablet Take 1 tablet (10 mg total) by mouth every 8 (eight) hours as needed for moderate pain. Patient not taking: Reported on 03/19/2015 02/12/15   Kathrynn Ducking, MD  oxyCODONE-acetaminophen (PERCOCET) 5-325 MG per tablet Take 1  tablet by mouth every 4 (four) hours as needed for severe pain. 04/06/15   Daleen Bo, MD  promethazine (PHENERGAN) 12.5 MG tablet Take 1 tablet (12.5 mg total) by mouth every 6 (six) hours as needed for nausea or vomiting. 02/24/15   Albertine Patricia, MD  promethazine (PHENERGAN) 25 MG tablet Take 1 tablet (25 mg total) by mouth every 6 (six) hours as needed for nausea or vomiting. Patient not taking: Reported on 04/06/2015 03/19/15   Evelina Bucy, MD  sucralfate (CARAFATE) 1 G tablet Take 1 tablet (1 g total) by mouth 4 (  four) times daily. Patient not taking: Reported on 04/06/2015 03/19/15   Elwin Mocha, MD  topiramate (TOPAMAX) 100 MG tablet Take 1 tablet (100 mg total) by mouth at bedtime. 02/18/15   York Spaniel, MD  zolpidem (AMBIEN) 5 MG tablet Take 1 tablet (5 mg total) by mouth at bedtime as needed for sleep. 12/26/14   Babs Sciara, MD   BP 123/70 mmHg  Pulse 70  Temp(Src) 98 F (36.7 C) (Oral)  Resp 16  Ht 5\' 6"  (1.676 m)  Wt 239 lb (108.41 kg)  BMI 38.59 kg/m2  SpO2 100%  LMP 03/19/2015 (Exact Date) Physical Exam  Nursing note and vitals reviewed.  29 year old female, resting comfortably and in no acute distress. Vital signs are normal. Oxygen saturation is 100%, which is normal. Head is normocephalic and atraumatic. PERRLA, EOMI. Oropharynx is clear. Neck is nontender and supple without adenopathy or JVD. Back is nontender and there is no CVA tenderness. Lungs are clear without rales, wheezes, or rhonchi. Chest is nontender. Heart has regular rate and rhythm without murmur. Abdomen is soft, flat, with moderate epigastric tenderness. There is no rebound or guarding. There are no masses or hepatosplenomegaly and peristalsis is normoactive. Extremities have no cyanosis or edema, full range of motion is present. Skin is warm and dry without rash. Neurologic: She is somnolent but arousable and is oriented when aroused and is able to answer questions appropriately, cranial  nerves are intact, there are no motor or sensory deficits.  ED Course  Procedures (including critical care time) Labs Review Results for orders placed or performed during the hospital encounter of 04/07/15  Comprehensive metabolic panel  Result Value Ref Range   Sodium 141 135 - 145 mmol/L   Potassium 3.8 3.5 - 5.1 mmol/L   Chloride 109 101 - 111 mmol/L   CO2 25 22 - 32 mmol/L   Glucose, Bld 124 (H) 70 - 99 mg/dL   BUN 8 6 - 20 mg/dL   Creatinine, Ser 06/07/15 0.44 - 1.00 mg/dL   Calcium 9.2 8.9 - 2.93 mg/dL   Total Protein 7.3 6.5 - 8.1 g/dL   Albumin 4.2 3.5 - 5.0 g/dL   AST 04.8 (H) 15 - 41 U/L   ALT 163 (H) 14 - 54 U/L   Alkaline Phosphatase 138 (H) 38 - 126 U/L   Total Bilirubin 0.5 0.3 - 1.2 mg/dL   GFR calc non Af Amer >60 >60 mL/min   GFR calc Af Amer >60 >60 mL/min   Anion gap 7 5 - 15  CBC with Differential  Result Value Ref Range   WBC 4.0 4.0 - 10.5 K/uL   RBC 3.98 3.87 - 5.11 MIL/uL   Hemoglobin 9.2 (L) 12.0 - 15.0 g/dL   HCT 013 (L) 53.2 - 97.9 %   MCV 76.9 (L) 78.0 - 100.0 fL   MCH 23.1 (L) 26.0 - 34.0 pg   MCHC 30.1 30.0 - 36.0 g/dL   RDW 69.4 (H) 21.6 - 66.8 %   Platelets 278 150 - 400 K/uL   Neutrophils Relative % 83 (H) 43 - 77 %   Neutro Abs 3.3 1.7 - 7.7 K/uL   Lymphocytes Relative 13 12 - 46 %   Lymphs Abs 0.5 (L) 0.7 - 4.0 K/uL   Monocytes Relative 4 3 - 12 %   Monocytes Absolute 0.2 0.1 - 1.0 K/uL   Eosinophils Relative 0 0 - 5 %   Eosinophils Absolute 0.0 0.0 - 0.7 K/uL  Basophils Relative 0 0 - 1 %   Basophils Absolute 0.0 0.0 - 0.1 K/uL  Ammonia  Result Value Ref Range   Ammonia 19 9 - 35 umol/L  I-Stat CG4 Lactic Acid, ED  Result Value Ref Range   Lactic Acid, Venous 0.55 0.5 - 2.0 mmol/L  Type and screen  Result Value Ref Range   ABO/RH(D) A POS    Antibody Screen PENDING    Sample Expiration 04/10/2015      MDM   Final diagnoses:  Epigastric pain  Microcytic anemia  Elevated liver enzymes    Epigastric pain with report of  hematemesis. However, vital signs are normal including pulse of 70 and normal blood pressure which is allergy higher than the majority of readings from yesterday. Somnolence of uncertain cause. Review of old records shows EGD done in 2013 showing castrate is and an ulcer in the ileum but no actual gastric or duodenal ulcers. She has a chronic anemia, and hemoglobin will need to be rechecked today. Cause of somnolence is not clear however she is on multiple medications which can cause somnolence. Drug screen done yesterday was positive for benzodiazepines and opiates, but she does have prescriptions for these medications.  Ammonia is come back normal as well as lactic acid. Hemoglobin is actually slightly higher than it was yesterday. WBC is normal although there is a left shift. She arrived in the ED almost immediately after vomiting, so blood loss would not be expected to be manifest on initial hemoglobin. It is noted that she has had a significant rise in all of her liver enzymes from yesterday to today and will cause for this is uncertain. Case is signed out to Dr. Lacinda Axon for further investigation.  Delora Fuel, MD 57/33/44 [id number]

## 2015-04-07 NOTE — Consult Note (Signed)
Referring Provider: Dr. Shanon Brow Tat  Primary Care Physician:  [name], MD Primary Gastroenterologist:  Dr. Oneida Alar   Date of Admission: 04/07/15 Date of Consultation: 04/07/15  Reason for Consultation:  Hematemesis, N/V, abdominal pain, elevated LFTs   HPI:  Penny [name] is a 29 y.o. year old female with multiple medical issues who presented to the ED with nausea, vomiting, diarrhea, and abdominal pain. She was actually scheduled to see Dr. Oneida Alar as an outpatient on 04/08/15. She has a history of intermittent elevated transaminases, with HCV negative in 2011, negative Hep B, normal ceruloplasmin. Celiac serologies normal. Referred to [facility] for further evaluation of transiently elevated transaminases.   Poor historian. Lethargic but arouses. Continues to drift off. N/V for past several days ,associated hematemesis today. Unable to quantify amount of blood. 6-7 loose stools for past few weeks. Inconsistent history when reviewing H&P from admitting documentation. Denies NSAIDs, aspirin powders. Notes dysphagia, odynophagia. Hgb 9.2, appears to be baseline. No melena. Last EGD in 2013 by Dr. Oneida Alar with mild gastritis, ulcer in mid jejunum (efferent limb), and empiric dilation.   Upon review of Care Everywhere, was seen at [facility] in the ED Feb 2016 with depression. Concern for polypharmacy raised at that time.    Past Medical History  Diagnosis Date  . Migraines   . Interstitial cystitis   . IUD FEB 2010  . Gastritis JULY 2011  . Depression   . 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 Fatty liver   . Obesity (BMI 30-39.9) 2011 228 LBS BMI 36.8  . Iron deficiency anemia 07/23/2010  . Irritable bowel syndrome 2012 DIARRHEA    JUN 2012 TTG IgA 14.9  . Elevated liver enzymes JUL 2011ALK PHOS 111-127 AST  143-267 ALT  213-321T BILI 0.6  ALB  3.7-4.06 Jun 2011 ALK PHOS 118 AST 24 ALT 42 T BILI 0.4 ALB 3.9  . Chronic daily headache   . Ovarian cyst   . PONV (postoperative nausea and  vomiting)   . ADD (attention deficit disorder)   . RLQ abdominal pain 07/18/2013  . Menorrhagia 07/18/2013  . Anxiety   . Potassium (K) deficiency   . Dysmenorrhea 09/18/2013  . Stress 09/18/2013  . Patient desires pregnancy 09/18/2013  . Pregnant 12/25/2013  . Blood transfusion without reported diagnosis   . Heart murmur   . Gastric bypass status for obesity   . 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 Dysrhythmia   . Sciatica of left side 11/14/2014  . Fibromyalgia   . 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    Past Surgical History  Procedure Laterality Date  . Gab  2007    in [place]-POUCH 5 CM  . Cholecystectomy  2005    biliary dyskinesia  . Cath self every nite      for sodium bicarb injection (discontinued 2013)  . Colonoscopy  JUN 2012 ABD PN/DIARRHEA WITH PROPOFOL    NL COLON  . Upper gastrointestinal endoscopy  JULY 2011 NAUSEA-D125,V6, PH 25    Bx; GASTRITIS, POUCH-5 CM LONG  . Dilation and curettage of uterus    . Tonsillectomy    . Gastric bypass  06/2006  . Savory dilation  06/20/2012    Dr. Barnie Alderman gastritis/Ulcer in the mid jejunum. Empiric dilation.   . Tonsillectomy and adenoidectomy    . Esophagogastroduodenoscopy    . Wisdom tooth extraction    . Repair vaginal cuff N/A 07/30/2014  Procedure: REPAIR VAGINAL CUFF;  Surgeon: Mora Bellman, MD;  Location: [place] [facility];  Service: Gynecology;  Laterality: N/A;  . Hysteroscopy w/d&c N/A 09/12/2014    Procedure: DILATATION AND CURETTAGE /HYSTEROSCOPY;  Surgeon: Jonnie Kind, MD;  Location: [facility];  Service: Gynecology;  Laterality: N/A;    Prior to Admission medications   Medication Sig Start Date End Date Taking? Authorizing Provider  clonazePAM (KLONOPIN) 0.5 MG tablet Take 1 tablet (0.5 mg total) by mouth 3 (three) times daily. Patient taking differently: Take 0.5 mg by mouth 3 (three) times daily as needed for anxiety.  03/03/15  Yes Kathrynn Ducking, MD  cyclobenzaprine (FLEXERIL)  5 MG tablet TAKE 1 TABLET BY MOUTH EVERY EIGHT HOURS AS NEEDED FOR MUSCLE SPASMS. 03/17/15  Yes Kathrynn Ducking, MD  DULoxetine (CYMBALTA) 30 MG capsule Take 1-2 capsules (30-60 mg total) by mouth 2 (two) times daily. Take 1 capsule (30 mg) every morning and 2 capsules (60 mg) every night 02/24/15  Yes Silver Huguenin Elgergawy, MD  esomeprazole (NEXIUM) 40 MG capsule Take 1 capsule (40 mg total) by mouth daily. 03/19/15  Yes Evelina Bucy, MD  ferrous sulfate 325 (65 FE) MG tablet Take 1 tablet (325 mg total) by mouth 2 (two) times daily with a meal. 02/06/15  Yes Okey Regal, PA-C  oxyCODONE-acetaminophen (PERCOCET) 5-325 MG per tablet Take 1 tablet by mouth every 4 (four) hours as needed for severe pain. 04/06/15  Yes Daleen Bo, MD  promethazine (PHENERGAN) 12.5 MG tablet Take 1 tablet (12.5 mg total) by mouth every 6 (six) hours as needed for nausea or vomiting. 02/24/15  Yes Albertine Patricia, MD  topiramate (TOPAMAX) 100 MG tablet Take 1 tablet (100 mg total) by mouth at bedtime. 02/18/15  Yes Kathrynn Ducking, MD  zolpidem (AMBIEN) 5 MG tablet Take 1 tablet (5 mg total) by mouth at bedtime as needed for sleep. 12/26/14  Yes Kathyrn Drown, MD  Clobetasol Prop Emollient Base 0.05 % emollient cream Apply small amount to affected area BID Patient not taking: Reported on 04/06/2015 02/18/15   Christin Fudge, CNM  HYDROcodone-acetaminophen (NORCO/VICODIN) 5-325 MG per tablet Take 1 tablet by mouth every 6 (six) hours as needed for moderate pain. Patient not taking: Reported on 04/07/2015 04/02/15   Kathyrn Drown, MD  ketorolac (TORADOL) 10 MG tablet Take 1 tablet (10 mg total) by mouth every 8 (eight) hours as needed for moderate pain. Patient not taking: Reported on 03/19/2015 02/12/15   Kathrynn Ducking, MD  promethazine (PHENERGAN) 25 MG tablet Take 1 tablet (25 mg total) by mouth every 6 (six) hours as needed for nausea or vomiting. Patient not taking: Reported on 04/06/2015 03/19/15   Evelina Bucy, MD   sucralfate (CARAFATE) 1 G tablet Take 1 tablet (1 g total) by mouth 4 (four) times daily. Patient not taking: Reported on 04/06/2015 03/19/15   Evelina Bucy, MD    Current Facility-Administered Medications  Medication Dose Route Frequency Provider Last Rate Last Dose  . 0.9 % NaCl with KCl 20 mEq/ L  infusion   Intravenous Continuous Orson Eva, MD 75 mL/hr at 04/07/15 1530    . acetaminophen (TYLENOL) tablet 650 mg  650 mg Oral Q6H PRN Orson Eva, MD       Or  . acetaminophen (TYLENOL) suppository 650 mg  650 mg Rectal Q6H PRN Orson Eva, MD      . clonazePAM Bobbye Charleston) tablet 0.5 mg  0.5 mg Oral TID PRN Orson Eva, MD      .  DULoxetine (CYMBALTA) DR capsule 30 mg  30 mg Oral Daily Orson Eva, MD   30 mg at 04/07/15 1545   And  . DULoxetine (CYMBALTA) DR capsule 60 mg  60 mg Oral QHS Orson Eva, MD      . HYDROcodone-acetaminophen (NORCO/VICODIN) 5-325 MG per tablet 1 tablet  1 tablet Oral Q4H PRN Orson Eva, MD      . pantoprazole (PROTONIX) 80 mg in sodium chloride 0.9 % 250 mL (0.32 mg/mL) infusion  8 mg/hr Intravenous Continuous Delora Fuel, MD 25 mL/hr at 04/07/15 0820 8 mg/hr at 04/07/15 0820  . [START ON 04/10/2015] pantoprazole (PROTONIX) injection 40 mg  40 mg Intravenous N47S Delora Fuel, MD      . promethazine (PHENERGAN) injection 12.5 mg  12.5 mg Intravenous Q6H PRN Orson Eva, MD      . promethazine (PHENERGAN) tablet 12.5 mg  12.5 mg Oral Q6H PRN Orson Eva, MD      . topiramate (TOPAMAX) tablet 100 mg  100 mg Oral QHS Orson Eva, MD        Allergies as of 04/07/2015 - Review Complete 04/07/2015  Allergen Reaction Noted  . Gabapentin Other (See Comments)   . Metoclopramide hcl Anxiety and Other (See Comments) 05/27/2011  . Codeine Nausea And Vomiting   . Zofran [ondansetron] Other (See Comments) 12/30/2014  . Latex Rash   . Tape Itching and Rash 03/08/2012    Family History  Problem Relation Age of Onset  . Hemochromatosis Maternal Grandmother   . Migraines Maternal Grandmother    . Cancer Maternal Grandmother   . Breast cancer Maternal Grandmother   . Hypertension Father   . Diabetes Father   . Coronary artery disease Father   . Migraines Paternal Grandmother   . Breast cancer Paternal Grandmother   . Cancer Mother     breast  . Hemochromatosis Mother   . Breast cancer Mother   . Coronary artery disease Paternal Grandfather     History   Social History  . Marital Status: Married    Spouse Name: N/A  . Number of Children: 2  . Years of Education: some colle   Occupational History  .     Social History Main Topics  . Smoking status: Current Every Day Smoker -- 0.50 packs/day for 6 years    Types: Cigarettes  . Smokeless tobacco: Never Used  . Alcohol Use: No  . Drug Use: No  . Sexual Activity: Not Currently    Birth Control/ Protection: None   Other Topics Concern  . Not on file   Social History Narrative   Patient is right handed.   Patient drinks one cup of coffee daily.    Review of Systems: As mentioned in HPI.   Physical Exam: Vital signs in last 24 hours: Temp:  [98 F (36.7 C)-98.3 F (36.8 C)] 98.3 F (36.8 C) (05/02 1450) Pulse Rate:  [59-87] 72 (05/02 1450) Resp:  [16-18] 18 (05/02 1200) BP: (93-123)/(56-81) 110/68 mmHg (05/02 1450) SpO2:  [96 %-100 %] 100 % (05/02 1450) Weight:  [239 lb (108.41 kg)-241 lb 3 oz (109.402 kg)] 241 lb 3 oz (109.402 kg) (05/02 1450) Last BM Date: 04/06/15 General:   Resting with eyes closed but awakens to verbal stimuli. Flat affect. Difficult to obtain information. Not consistent with replies Head:  Normocephalic and atraumatic. Eyes:  Sclera clear, no icterus.   Conjunctiva pink. Ears:  Normal auditory acuity. Nose:  No deformity, discharge,  or lesions. Mouth:  No deformity  or lesions, dentition normal. Lungs:  Clear throughout to auscultation.   No wheezes, crackles, or rhonchi. No acute distress. Heart:  Regular rate and rhythm; no murmurs, clicks, rubs,  or gallops. Abdomen:  Soft,  TTP upper abdomen and nondistended. No masses, hepatosplenomegaly or hernias noted. Normal bowel sounds, without guarding, and without rebound.   Rectal:  Deferred   Msk:  Symmetrical without gross deformities. Normal posture. Extremities:  Without  edema. Neurologic:   oriented x4;  grossly normal neurologically. Skin:  Intact without significant lesions or rashes. Psych:  Flat affect  Intake/Output from previous day:   Intake/Output this shift: Total I/O In: -  Out: 801 [Urine:800; Stool:1]  Lab Results:  Recent Labs  04/06/15 1019 04/07/15 0651  WBC 4.1 4.0  HGB 8.8* 9.2*  HCT 28.7* 30.6*  PLT 267 278   BMET  Recent Labs  04/06/15 1019 04/07/15 0651  NA 139 141  K 3.6 3.8  CL 110 109  CO2 23 25  GLUCOSE 94 124*  BUN 11 8  CREATININE 0.58 0.48  CALCIUM 9.0 9.2   LFT  Recent Labs  04/06/15 1019 04/07/15 0651  PROT 7.2 7.3  ALBUMIN 4.2 4.2  AST 22 170*  ALT 17 163*  ALKPHOS 89 138*  BILITOT 0.5 0.5   Lab Results  Component Value Date   LIPASE 25 04/07/2015    Studies/Results: Ct Abdomen Pelvis W Contrast  04/07/2015   CLINICAL DATA:  One day history of nausea and vomiting with 1 week history of epigastric pain and diarrhea.  EXAM: CT ABDOMEN AND PELVIS WITH CONTRAST  TECHNIQUE: Multidetector CT imaging of the abdomen and pelvis was performed using the standard protocol following bolus administration of intravenous contrast. Oral contrast was also administered.  CONTRAST:  39m OMNIPAQUE IOHEXOL 300 MG/ML SOLN, 1039mOMNIPAQUE IOHEXOL 300 MG/ML SOLN  COMPARISON:  March 19, 2015  FINDINGS: There is atelectatic change in the left lung base. Lung bases otherwise clear.  Patient is status post gastric bypass procedure without complicating features. There is no wall thickening or perigastric fluid in the left upper quadrant region.  No focal liver lesions are identified. The gallbladder is absent. The distal common bile duct measures 11 mm, stable. There is no  mass or calculus appreciable in the biliary ductal system.  Spleen, pancreas, and adrenals appear normal.  Kidneys bilaterally show no mass or hydronephrosis on either side. There is no renal or ureteral calculus on either side.  In the pelvis, the urinary bladder is midline with normal wall thickness. There is no pelvic mass or pelvic fluid collection. The appendix appears normal. Note that the recent right ovarian cyst is no longer appreciable.  There is no bowel obstruction. No free air or portal venous air. There is no ascites, adenopathy, or abscess in the abdomen or pelvis. There is no demonstrable abdominal aortic aneurysm. There are no blastic or lytic bone lesions.  IMPRESSION: Patient is status post gastric bypass procedure without complicating feature appreciable. Currently there is no bowel obstruction. No abscess. Appendix region appears normal.  Mild common bile duct prominence is stable and is probably secondary to post cholecystectomy state. No biliary duct mass or calculus seen.  Previously noted right ovarian cyst has resolved.  No renal or ureteral calculus.  No hydronephrosis.   Electronically Signed   By: WiLowella GripII M.D.   On: 04/07/2015 09:05   UsKoreabdomen Limited Ruq  04/07/2015   CLINICAL DATA:  One month of abdominal pain  with increased transaminases, history of cholecystectomy, irritable bowel disease, gastric bypass.  EXAM: US ABDOMEN LIMITED - RIGHT UPPER QUADRANT  COMPARISON:  Abdominal ultrasound of September 13, 2014  FINDINGS: The study is limited by the patient's body habitus, bowel gas, and inability to hold her breath.  Gallbladder:  The gallbladder is surgically absent.  Common bile duct:  Diameter: 5.6 mm  Liver:  The hepatic echotexture is mildly increased. There is no focal mass or intrahepatic ductal dilation. Where visualized the surface contour is normal.  IMPRESSION: 1. Fatty infiltrative change of the liver. Within the limits of the study no hepatic parenchymal  abnormality is demonstrated otherwise. 2. The gallbladder is surgically absent.   Electronically Signed   By: David  Martinique M.D.   On: 04/07/2015 16:45    Impression/Plan: 29 year old female with history of gastric bypass years ago, presenting with acute on chronic abdominal pain, associated N/V, and hematemesis. Multiple comorbidities noted, with recent presentation to [facility] for depression and polypharmacy.   N/V: concern for gastritis, PUD, stricture s/p gastric bypass. Associated hematemesis likely secondary to gastritis, possible ulcers, possible MW tear in setting of repetitive vomiting. Would recommend EGD with Propofol and dilation as appropriate with Dr. Oneida Alar on 5/3. Continue Protonix drip for now. Supportive measures. Agree with pregnancy screen. Will reassess on 5/3 in the morning.   Abdominal pain: acute on chronic. Multiple factors likely contributing. CT without obvious evidence of internal hernia and highly doubt this is a concern. Will review CT in further detail with radiologist on 5/3.   Diarrhea: inconsistent reports. Check stool studies as ordered. No evidence for colitis on CT.   Elevated LFTs: chronic, intermittent bumps in transaminases with normalization noted in between episodes. Known fatty liver and absent gallbladder. Prior work-up several years ago unrevealing and subsequent referral to [facility] recommended. Does not appear she was seen at [facility] but will need to investigate further. US abdomen reviewed. Await hepatitis panel. Agree with drug screen. Check acetaminophen level. Patient poor historian and polypharmacy concerning.   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 No significant drops in Hgb despite reports of hematemesis. Will continue to follow. Likely EGD on 04/08/15.    Orvil Feil, ANP-BC [facility]        LOS: 0 days    04/07/2015, 6:30 PM    Attending note:  Pt seen and examined this evening. Agree with  above.

## 2015-04-07 NOTE — Progress Notes (Signed)
Pt states she did not want her medications due to fear of throwing up. Pt states she does not feel nauseous, but fears she may get sick if she takes anything. Pt did ask for regular food, but due to her clear liquid diet, was unable to give solid food to her. Offered pt phenergan to help with nausea, but pt refused the phenergan also. Pt questioned why her pain medicine was by mouth and not iv. Offered pt emotional support.

## 2015-04-07 NOTE — Progress Notes (Signed)
MD responded and put in new orders. Pt states she is unable to take Toradol. Pt states Toradol triggers seizures. Notified MD. Pt is currently asleep and resting.

## 2015-04-08 ENCOUNTER — Telehealth: Payer: Self-pay | Admitting: Gastroenterology

## 2015-04-08 ENCOUNTER — Inpatient Hospital Stay (HOSPITAL_COMMUNITY): Payer: 59

## 2015-04-08 ENCOUNTER — Ambulatory Visit: Payer: Self-pay | Admitting: Gastroenterology

## 2015-04-08 DIAGNOSIS — G43A1 Cyclical vomiting, intractable: Secondary | ICD-10-CM

## 2015-04-08 DIAGNOSIS — R1010 Upper abdominal pain, unspecified: Secondary | ICD-10-CM

## 2015-04-08 LAB — HEPATITIS B SURFACE ANTIGEN: HEP B S AG: NEGATIVE

## 2015-04-08 LAB — HEPATITIS C ANTIBODY: HCV Ab: NEGATIVE

## 2015-04-08 MED ORDER — SACCHAROMYCES BOULARDII 250 MG PO CAPS
250.0000 mg | ORAL_CAPSULE | Freq: Two times a day (BID) | ORAL | Status: DC
  Administered 2015-04-08 – 2015-04-10 (×5): 250 mg via ORAL
  Filled 2015-04-08 (×5): qty 1

## 2015-04-08 MED ORDER — HYDROMORPHONE HCL 1 MG/ML IJ SOLN
0.5000 mg | INTRAMUSCULAR | Status: DC | PRN
  Administered 2015-04-08 – 2015-04-09 (×7): 0.5 mg via INTRAVENOUS
  Filled 2015-04-08 (×7): qty 1

## 2015-04-08 MED ORDER — PANTOPRAZOLE SODIUM 40 MG PO TBEC
40.0000 mg | DELAYED_RELEASE_TABLET | Freq: Every day | ORAL | Status: DC
  Administered 2015-04-09: 40 mg via ORAL
  Filled 2015-04-08: qty 1

## 2015-04-08 MED ORDER — SODIUM CHLORIDE 0.9 % IV SOLN
510.0000 mg | Freq: Once | INTRAVENOUS | Status: AC
  Administered 2015-04-08: 510 mg via INTRAVENOUS
  Filled 2015-04-08: qty 17

## 2015-04-08 MED ORDER — HYDROMORPHONE HCL 1 MG/ML IJ SOLN
1.0000 mg | Freq: Once | INTRAMUSCULAR | Status: AC
  Administered 2015-04-08: 1 mg via INTRAVENOUS
  Filled 2015-04-08: qty 1

## 2015-04-08 NOTE — Progress Notes (Signed)
Subjective: Nausea persists, no vomiting. Epigastric pain, radiates down right side and through back. Notes sensation of feeling food "stuck" in upper abdomen after eating. Symptoms for about the past month. No loose stools today. Watery stools overnight. Notes alternating constipation and diarrhea chronically. Takes iron 65 mg TID. Feels like stomach got "worse" while on it.   Objective: Vital signs in last 24 hours: Temp:  [98.2 F (36.8 C)-98.4 F (36.9 C)] 98.4 F (36.9 C) (05/03 0652) Pulse Rate:  [59-87] 72 (05/03 0652) Resp:  [17-18] 18 (05/03 0652) BP: (93-121)/(56-81) 119/71 mmHg (05/03 0652) SpO2:  [96 %-100 %] 99 % (05/03 0652) Weight:  [241 lb 3 oz (109.402 kg)] 241 lb 3 oz (109.402 kg) (05/02 1450) Last BM Date: 04/07/15 General:   Alert and oriented, tearful  Head:  Normocephalic and atraumatic. Abdomen:  Bowel sounds present, soft, winces before even examining abdomen. TTP upper abdomen, diffusely.  Msk:  Symmetrical without gross deformities. Normal posture. Extremities:  Without  edema. Neurologic:  Alert and  oriented x4;  grossly normal neurologically. Psych:  Tearful  Intake/Output from previous day: 05/02 0701 - 05/03 0700 In: -  Out: 1101 [Urine:1100; Stool:1] Intake/Output this shift:    Lab Results:  Recent Labs  04/06/15 1019 04/07/15 0651 04/08/15 0744  WBC 4.1 4.0 5.3  HGB 8.8* 9.2* 7.8*  HCT 28.7* 30.6* 25.1*  PLT 267 278 242   BMET  Recent Labs  04/06/15 1019 04/07/15 0651 04/08/15 0744  NA 139 141 140  K 3.6 3.8 3.5  CL 110 109 110  CO2 23 25 25   GLUCOSE 94 124* 101*  BUN 11 8 5*  CREATININE 0.58 0.48 0.54  CALCIUM 9.0 9.2 8.3*   LFT  Recent Labs  04/06/15 1019 04/07/15 0651 04/08/15 0744  PROT 7.2 7.3 5.6*  ALBUMIN 4.2 4.2 3.3*  AST 22 170* 44*  ALT 17 163* 80*  ALKPHOS 89 138* 98  BILITOT 0.5 0.5 0.4  Hepatitis Panel  Recent Labs  04/07/15 1612  HEPBSAG NEGATIVE  HCVAB NEGATIVE   Stool studies: Cdiff  PCR negative, fecal lactoferrin in process.   URINE PREGNANCY NEGATIVE.   Studies/Results: Ct Abdomen Pelvis W Contrast  04/07/2015   CLINICAL DATA:  One day history of nausea and vomiting with 1 week history of epigastric pain and diarrhea.  EXAM: CT ABDOMEN AND PELVIS WITH CONTRAST  TECHNIQUE: Multidetector CT imaging of the abdomen and pelvis was performed using the standard protocol following bolus administration of intravenous contrast. Oral contrast was also administered.  CONTRAST:  28mL OMNIPAQUE IOHEXOL 300 MG/ML SOLN, 164mL OMNIPAQUE IOHEXOL 300 MG/ML SOLN  COMPARISON:  March 19, 2015  FINDINGS: There is atelectatic change in the left lung base. Lung bases otherwise clear.  Patient is status post gastric bypass procedure without complicating features. There is no wall thickening or perigastric fluid in the left upper quadrant region.  No focal liver lesions are identified. The gallbladder is absent. The distal common bile duct measures 11 mm, stable. There is no mass or calculus appreciable in the biliary ductal system.  Spleen, pancreas, and adrenals appear normal.  Kidneys bilaterally show no mass or hydronephrosis on either side. There is no renal or ureteral calculus on either side.  In the pelvis, the urinary bladder is midline with normal wall thickness. There is no pelvic mass or pelvic fluid collection. The appendix appears normal. Note that the recent right ovarian cyst is no longer appreciable.  There is no bowel obstruction.  No free air or portal venous air. There is no ascites, adenopathy, or abscess in the abdomen or pelvis. There is no demonstrable abdominal aortic aneurysm. There are no blastic or lytic bone lesions.  IMPRESSION: Patient is status post gastric bypass procedure without complicating feature appreciable. Currently there is no bowel obstruction. No abscess. Appendix region appears normal.  Mild common bile duct prominence is stable and is probably secondary to post  cholecystectomy state. No biliary duct mass or calculus seen.  Previously noted right ovarian cyst has resolved.  No renal or ureteral calculus.  No hydronephrosis.   Electronically Signed   By: Lowella Grip III M.D.   On: 04/07/2015 09:05   US Abdomen Limited Ruq  04/07/2015   CLINICAL DATA:  One month of abdominal pain with increased transaminases, history of cholecystectomy, irritable bowel disease, gastric bypass.  EXAM: US ABDOMEN LIMITED - RIGHT UPPER QUADRANT  COMPARISON:  Abdominal ultrasound of September 13, 2014  FINDINGS: The study is limited by the patient's body habitus, bowel gas, and inability to hold her breath.  Gallbladder:  The gallbladder is surgically absent.  Common bile duct:  Diameter: 5.6 mm  Liver:  The hepatic echotexture is mildly increased. There is no focal mass or intrahepatic ductal dilation. Where visualized the surface contour is normal.  IMPRESSION: 1. Fatty infiltrative change of the liver. Within the limits of the study no hepatic parenchymal abnormality is demonstrated otherwise. 2. The gallbladder is surgically absent.   Electronically Signed   By: David  Martinique M.D.   On: 04/07/2015 16:45    Assessment: 29 year old female with acute on chronic abdominal pain, nausea, vomiting, s/p gastric bypass in the past, with associated hematemesis likely secondary to gastritis, possible ulcerative process, esophagitis, MW tear. Nausea persists but no further vomiting or overt signs of GI bleeding. Drift in Hgb likely multifactorial but still below baseline of 8/9 range. With current symptoms, concern for anastomotic stricture, ulcer. Will still need to review CT scan with radiologist to evaluate for any mesenteric swirling that would indicate internal hernia process. Highly doubt this is the case but will still rule out. Ultimately needs EGD with dilation as appropriate with Dr. Oneida Alar. Will need Propofol for sedation due to polypharmacy. Will discuss with Dr. Oneida Alar timing.    Elevated LFTs: transient bumps noted with normalization documented. LFTs trending down. Known fatty liver and absent gallbladder. Prior work-up several years ago unrevealing and subsequent referral to [facility] recommended. Never seen at [facility] but will need outpatient referral again. Viral markers negative.   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 Hgb 7.8 today. Likely EGD today.   Diarrhea: Cdiff PCR negative. History of alternating constipation/diarrhea. Likely IBS. No evidence of colitis on CT.   Plan: NPO Continue Protonix drip Will change Dilaudid IV 0.5 mg to every 3 hours instead of 6 for short interval.  Review CT with radiologist Phenergan prn nausea EGD with dilation utilizing Propofol. Will discuss with Dr. Oneida Alar timing.  Outpatient appt at [facility] due to elevated LFTs Add probiotic   Orvil Feil, ANP-BC [facility]    LOS: 1 day    04/08/2015, 8:22 AM

## 2015-04-08 NOTE — Progress Notes (Signed)
Pt is c/o increased pain to right side that is a harder pain than she has been experiencing. Mid-level paged and orders given for 1x dose of Dilaudid 1mg .

## 2015-04-08 NOTE — Progress Notes (Signed)
Pt transferred to 213. Nurse given report.

## 2015-04-08 NOTE — Progress Notes (Signed)
PROGRESS NOTE  Penny [name] UYQ:[id number] DOB: 04-May-1986 DOA: 04/07/2015 PCP: [name], MD  Brief history 29 year old female with a history of pseudoseizure, depression, anxiety, gastric bypass presents with one-month history of lower abdominal pain. The patient presented to the emergency department on 04/06/2015 with nausea, vomiting, and abdominal pain. The patient apparently has an appointment to see Dr. Darrick Penna on 04/08/2015. She states that she has been having vomiting for approximately 3 days. However on the morning of admission, the patient claims to have had coffee-ground emesis as well as an episode of hematemesis. The patient has not had any emesis since admission to the hospital. EGD in July 2013 by Dr. Darrick Penna with mild gastritis, ulcer in mid jejunum (efferent limb), and empiric dilation. GI was consulted and  the patient underwent a barium pill swallow as there was concern for gastritis, peptic ulcer disease, and stricture status post gastric bypass. Assessment/Plan: Intractable nausea, vomiting, diarrhea with hematemesis -appreciate gastroenterology consultation -Barium pill study showed normal peristalsis, no visible structure but there was a brief sticking of the barium pill in the distal esophagus -NPO for possible endoscopy due to concerns for possible gastritis/esophagitis or ulcerative process -04/07/2015 CT abdomen and pelvis negative for any acute findings -I have requested that the patient notify the nursing staff with each episode of emesis or bowel movement--none since admission -Stool for C. difficile PCR--neg -Stool lactoferrin -Hemoccult stool -Hemoglobin is stable at 9.2 at time of admission; patient's baseline hemoglobin is 9 -04/08/15--drop in Hgb is dilution -Lactic acid is 0.55 -Repeat urine drug screen-->benzo and opiates -04/06/2015 urinalysis is negative for pyuria or hematuria -Lipase 25 -Continue IVF -continue protonix gtt per GI -pt  asking for increase in dilaudid dose but appears very comfortable presently Transaminasemia -04/07/15--CT abdomen and pelvis shows common bile duct 11 mm--she is status post cholecystectomy -Hepatitis B surface antigen--neg -Hepatitis C antibody--neg -HIV antibody--neg -Trending down -RUQ US--fatty liver -Prior work-up several years ago unrevealing and subsequent referral to [facility] recommended per GI Morbid obesity -BMI 38 Impaired glucose tolerance -01/15/2015 hemoglobin A1c 5.7   Family Communication:   Pt at beside Disposition Plan:   Home when medically stable       Procedures/Studies: Ct Abdomen Pelvis W Contrast  04/07/2015   CLINICAL DATA:  One day history of nausea and vomiting with 1 week history of epigastric pain and diarrhea.  EXAM: CT ABDOMEN AND PELVIS WITH CONTRAST  TECHNIQUE: Multidetector CT imaging of the abdomen and pelvis was performed using the standard protocol following bolus administration of intravenous contrast. Oral contrast was also administered.  CONTRAST:  25mL OMNIPAQUE IOHEXOL 300 MG/ML SOLN, OMNIPAQUE IOHEXOL 300 MG/ML SOLN  COMPARISON:  March 19, 2015  FINDINGS: There is atelectatic change in the left lung base. Lung bases otherwise clear.  Patient is status post gastric bypass procedure without complicating features. There is no wall thickening or perigastric fluid in the left upper quadrant region.  No focal liver lesions are identified. The gallbladder is absent. The distal common bile duct measures 11 mm, stable. There is no mass or calculus appreciable in the biliary ductal system.  Spleen, pancreas, and adrenals appear normal.  Kidneys bilaterally show no mass or hydronephrosis on either side. There is no renal or ureteral calculus on either side.  In the pelvis, the urinary bladder is midline with normal wall thickness. There is no pelvic mass or pelvic fluid collection. The appendix appears normal. Note that the recent  right ovarian cyst is no  longer appreciable.  There is no bowel obstruction. No free air or portal venous air. There is no ascites, adenopathy, or abscess in the abdomen or pelvis. There is no demonstrable abdominal aortic aneurysm. There are no blastic or lytic bone lesions.  IMPRESSION: Patient is status post gastric bypass procedure without complicating feature appreciable. Currently there is no bowel obstruction. No abscess. Appendix region appears normal.  Mild common bile duct prominence is stable and is probably secondary to post cholecystectomy state. No biliary duct mass or calculus seen.  Previously noted right ovarian cyst has resolved.  No renal or ureteral calculus.  No hydronephrosis.   Electronically Signed   By: Bretta Bang III M.D.   On: 04/07/2015 09:05   Ct Abdomen Pelvis W Contrast  03/19/2015   CLINICAL DATA:  Upper abdominal pain with nausea and vomiting for 3 days  EXAM: CT ABDOMEN AND PELVIS WITH CONTRAST  TECHNIQUE: Multidetector CT imaging of the abdomen and pelvis was performed using the standard protocol following bolus administration of intravenous contrast.  CONTRAST:  OMNIPAQUE IOHEXOL 300 MG/ML  SOLN  COMPARISON:  09/03/2014  FINDINGS: BODY WALL: No contributory findings.  LOWER CHEST: No contributory findings.  ABDOMEN/PELVIS:  Liver: No focal abnormality.  Biliary: Cholecystectomy with chronic mild intra and extrahepatic biliary dilatation, likely reservoir effect given stability.  Pancreas: Unremarkable.  Spleen: Unremarkable.  Adrenals: Unremarkable.  Kidneys and ureters: No hydronephrosis or stone.  Bladder: Unremarkable.  Reproductive: Interval normalization of the uterus. There is a simple appearing 4 cm cyst in the right ovary. Given development since September 2015 this is consistent with a benign, likely physiologic process.  Bowel: Roux-en-Y gastric bypass. No evidence of gastrogastric fistula, marginal ulcer, or obstruction. Normal appendix.  Retroperitoneum: No mass or adenopathy.   Peritoneum: No ascites or pneumoperitoneum.  Vascular: No acute abnormality.  OSSEOUS: No acute abnormalities.  IMPRESSION: 1. No acute findings. 2. 4 cm right ovarian cyst. 3. Chronic and postoperative changes are described above.   Electronically Signed   By: Marnee Spring M.D.   On: 03/19/2015 06:55   Dg Esophagus  04/08/2015   CLINICAL DATA:  Dysphagia.  Epigastric pain.  EXAM: ESOPHOGRAM / BARIUM SWALLOW / BARIUM TABLET STUDY  TECHNIQUE: Combined double contrast and single contrast examination performed using effervescent crystals, thick barium liquid, and thin barium liquid. The patient was observed with fluoroscopy swallowing a 13 mm barium sulphate tablet.  FLUOROSCOPY TIME:  Radiation Exposure Index (as provided by the fluoroscopic device):  If the device does not provide the exposure index:  Fluoroscopy Time:  2 minutes 48 seconds  Number of Acquired Images:  0  COMPARISON:  CT 04/07/2015  FINDINGS: Fluoroscopic evaluation of swallowing demonstrates normal esophageal peristalsis. No stricture, fold thickening or mass. No reflux with the water siphon maneuver. The patient swallowed a 13 mm barium tablet which briefly sticks in the distal esophagus despite no visible stricture. This reproduces the patient's symptoms.  IMPRESSION: Brief sticking of the 13 mm barium tablet in the distal esophagus, reproducing the patient's symptoms. No visible abnormality in this area.   Electronically Signed   By: Charlett Nose M.D.   On: 04/08/2015 10:57   Dg Abd Acute W/chest  03/20/2015   CLINICAL DATA:  Abdominal pain with vomiting and diarrhea for 3 days. Gastric ulcers.  EXAM: DG ABDOMEN ACUTE W/ 1V CHEST  COMPARISON:  Abdominal CT from 1 day prior  FINDINGS: There is no evidence of dilated bowel  loops or free intraperitoneal air. No radiopaque calculi or other significant radiographic abnormality is seen. There are changes of gastric bypass and cholecystectomy. Heart size and mediastinal contours are within  normal limits. Both lungs are clear.  IMPRESSION: 1. Negative abdominal radiographs. No acute cardiopulmonary disease. 2. Cholecystectomy and gastric bypass.   Electronically Signed   By: Marnee Spring M.D.   On: 03/20/2015 06:52   US Abdomen Limited Ruq  04/07/2015   CLINICAL DATA:  One month of abdominal pain with increased transaminases, history of cholecystectomy, irritable bowel disease, gastric bypass.  EXAM: US ABDOMEN LIMITED - RIGHT UPPER QUADRANT  COMPARISON:  Abdominal ultrasound of September 13, 2014  FINDINGS: The study is limited by the patient's body habitus, bowel gas, and inability to hold her breath.  Gallbladder:  The gallbladder is surgically absent.  Common bile duct:  Diameter: 5.6 mm  Liver:  The hepatic echotexture is mildly increased. There is no focal mass or intrahepatic ductal dilation. Where visualized the surface contour is normal.  IMPRESSION: 1. Fatty infiltrative change of the liver. Within the limits of the study no hepatic parenchymal abnormality is demonstrated otherwise. 2. The gallbladder is surgically absent.   Electronically Signed   By: Mabeline Varas  [place] M.D.   On: 04/07/2015 16:45         Subjective: Patient has not had any emesis since admission. He states that her diarrhea has improved. She still having abdominal pain and requesting increased dose of Dilaudid. Denies any fevers, chills, chest pain shortness breath, hematemesis, hematochezia, melena.  Objective: Filed Vitals:   04/07/15 1400 04/07/15 1450 04/07/15 2333 04/08/15 0652  BP: 108/64 110/68 118/72 119/71  Pulse: 59 72 69 72  Temp:  98.3 F (36.8 C) 98.2 F (36.8 C) 98.4 F (36.9 C)  TempSrc:  Oral Oral Oral  Resp:   18 18  Height:  5\' 6"  (1.676 m)    Weight:  109.402 kg (241 lb 3 oz)    SpO2: 97% 100% 100% 99%    Intake/Output Summary (Last 24 hours) at 04/08/15 1131 Last data filed at 04/08/15 0549  Gross per 24 hour  Intake 1073.75 ml  Output    300 ml  Net 773.75 ml   Weight  change: 0.992 kg (2 lb 3 oz) Exam:   General:  Pt is alert, follows commands appropriately, not in acute distress  HEENT: No icterus, No thrush,/AT  Cardiovascular: RRR, S1/S2, no rubs, no gallops  Respiratory: CTA bilaterally, no wheezing, no crackles, no rhonchi  Abdomen: Soft/+BS, epigastric tenderness without any rebound, non distended, no guarding  Extremities: No edema, No lymphangitis, No petechiae, No rashes, no synovitis  Data Reviewed: Basic Metabolic Panel:  Recent Labs Lab 04/06/15 1019 04/07/15 0651 04/08/15 0744  NA 139 141 140  K 3.6 3.8 3.5  CL 110 109 110  CO2 23 25 25   GLUCOSE 94 124* 101*  BUN 11 8 5*  CREATININE 0.58 0.48 0.54  CALCIUM 9.0 9.2 8.3*   Liver Function Tests:  Recent Labs Lab 04/06/15 1019 04/07/15 0651 04/08/15 0744  AST 22 170* 44*  ALT 17 163* 80*  ALKPHOS 89 138* 98  BILITOT 0.5 0.5 0.4  PROT 7.2 7.3 5.6*  ALBUMIN 4.2 4.2 3.3*    Recent Labs Lab 04/07/15 0705  LIPASE 25    Recent Labs Lab 04/07/15 0651  AMMONIA 19   CBC:  Recent Labs Lab 04/06/15 1019 04/07/15 0651 04/08/15 0744  WBC 4.1 4.0 5.3  NEUTROABS 1.5* 3.3  --  HGB 8.8* 9.2* 7.8*  HCT 28.7* 30.6* 25.1*  MCV 75.7* 76.9* 76.3*  PLT 267 278 242   Cardiac Enzymes: No results for input(s): CKTOTAL, CKMB, CKMBINDEX, TROPONINI in the last 168 hours. BNP: Invalid input(s): POCBNP CBG: No results for input(s): GLUCAP in the last 168 hours.  Recent Results (from the past 240 hour(s))  Urine culture     Status: None   Collection Time: 04/06/15 11:54 AM  Result Value Ref Range Status   Specimen Description URINE, CLEAN CATCH  Final   Special Requests Normal  Final   Colony Count   Final    15,000 COLONIES/ML Performed at [facility]    Culture   Final    Multiple bacterial morphotypes present, none predominant. Suggest appropriate recollection if clinically indicated. Performed at [facility]    Report Status 04/07/2015  FINAL  Final  Clostridium Difficile by PCR     Status: None   Collection Time: 04/07/15  7:51 PM  Result Value Ref Range Status   C difficile by pcr NEGATIVE NEGATIVE Final     Scheduled Meds: . DULoxetine  30 mg Oral Daily   And  . DULoxetine  60 mg Oral QHS  . ferumoxytol  510 mg Intravenous Once  . [START ON 04/10/2015] pantoprazole (PROTONIX) IV  40 mg Intravenous Q12H  . saccharomyces boulardii  250 mg Oral BID  . topiramate  100 mg Oral QHS   Continuous Infusions: . 0.9 % NaCl with KCl 20 mEq / L Stopped (04/08/15 1110)  . pantoprozole (PROTONIX) infusion Stopped (04/08/15 1110)     Dyesha Henault, DO  [facility] Pager [phone]  If 7PM-7AM, please contact night-coverage [email or web address] Password [email or web address] 04/08/2015, 11:31 AM   LOS: 1 day

## 2015-04-08 NOTE — Telephone Encounter (Signed)
Patient called office early this morning asking to speak with SF or any of the extenders. I told her that no one was in the office yet that they were rounding at the hospital and SF was doing procedures. Patient was calling from her hospital room saying that no one is telling her anything and she needed answers. I told her she should ask for the charge nurse since she is under the hospital's care and if a GI consult was done Im sure someone would be coming to her room. She said, "OK" and hung up.

## 2015-04-08 NOTE — Telephone Encounter (Signed)
REVIEWED-NO ADDITIONAL RECOMMENDATIONS. 

## 2015-04-08 NOTE — Care Management Note (Signed)
Case Management Note  Patient Details  Name: TANESHA ARAMBULA MRN: [id number] Date of Birth: 1986-05-25  Subjective/Objective:                    Action/Plan: Pt is from home with self care. Pt has no HH services or DME's. Pt plans to discharge home with self care. Referral made to financial counselor. No CM needs.   Expected Discharge Date:  04/09/15               Expected Discharge Plan:  Home/Self Care  In-House Referral:  Financial Counselor  Discharge planning Services  CM Consult  Post Acute Care Choice:    Choice offered to:     DME Arranged:    DME Agency:     HH Arranged:    [place] Agency:     Status of Service:  Completed, signed off  Medicare Important Message Given:    Date Medicare IM Given:    Medicare IM give by:    Date Additional Medicare IM Given:    Additional Medicare Important Message give by:     If discussed at [place] of Stay Meetings, dates discussed:    Additional Comments:  Sherald Barge, RN 04/08/2015, 8:21 AM

## 2015-04-08 NOTE — Telephone Encounter (Signed)
We saw patient in consultation yesterday, and I have seen her already this morning. Thanks!

## 2015-04-09 ENCOUNTER — Encounter: Payer: Self-pay | Admitting: Gastroenterology

## 2015-04-09 DIAGNOSIS — R1310 Dysphagia, unspecified: Secondary | ICD-10-CM | POA: Diagnosis present

## 2015-04-09 LAB — VITAMIN B12: VITAMIN B 12: 590 pg/mL (ref 180–914)

## 2015-04-09 MED ORDER — POTASSIUM CHLORIDE CRYS ER 20 MEQ PO TBCR
40.0000 meq | EXTENDED_RELEASE_TABLET | Freq: Once | ORAL | Status: AC
  Administered 2015-04-09: 40 meq via ORAL
  Filled 2015-04-09: qty 2

## 2015-04-09 MED ORDER — SODIUM CHLORIDE 0.9 % IV SOLN: INTRAVENOUS | Status: DC

## 2015-04-09 MED ORDER — PANTOPRAZOLE SODIUM 40 MG PO TBEC
40.0000 mg | DELAYED_RELEASE_TABLET | Freq: Two times a day (BID) | ORAL | Status: DC
  Administered 2015-04-09 – 2015-04-10 (×2): 40 mg via ORAL
  Filled 2015-04-09 (×2): qty 1

## 2015-04-09 NOTE — Progress Notes (Signed)
[facility] PROGRESS NOTE  Penny [name] YBO:[id number] DOB: 10-Oct-1986 DOA: 04/07/2015 PCP: [name], MD  Assessment/Plan: Intractable nausea, vomiting, diarrhea with hematemesis -tolerating full liquids with no nausea or vomiting. Continues with pain worsening with eating. Barium pill study showed normal peristalsis, no visible structure but there was a brief sticking of the barium pill in the distal esophagus. Concerns for gastritiss/esophagitis or ulcerative process. EGD tomorrow. 04/07/2015 CT abdomen and pelvis negative for any acute findings.Stool for C. difficile PCR--neg Hemoglobin is stable at 9.2 at time of admission; patient's baseline hemoglobin is 9 -Lactic acid is 0.55 -Repeat urine drug screen-->benzo and opiates -04/06/2015 urinalysis is negative for pyuria or hematuria -Lipase 25 -Continue IVF at lower rate -continue protonix gtt per GI -pt asking for increase in dilaudid dose but appears very comfortable presently. Have discontinued IV pain med Transaminasemia -04/07/15--CT abdomen and pelvis shows common bile duct 11 mm--she is status post cholecystectomy -Hepatitis B surface antigen--neg -Hepatitis C antibody--neg -HIV antibody--neg -Trending down -RUQ US--fatty liver -Prior work-up several years ago unrevealing and subsequent referral to [facility] recommended per GI Morbid obesity -BMI 38 Impaired glucose tolerance -01/15/2015 hemoglobin A1c 5.7 Anemia Chronic disease. Hg stable. See #1.    Code Status: full Family Communication: husband at bedside Disposition Plan: home hopefully tomorrow   Consultants:  gastroenterology  Procedures:  EGD  Antibiotics:  none  HPI/Subjective: Sitting up in bed eating. Complains of RUQ pain  Objective: Filed Vitals:   04/09/15 1425  BP: 113/56  Pulse: 63  Temp: 97.4 F (36.3 C)  Resp: 18    Intake/Output Summary (Last 24 hours) at 04/09/15 1556 Last data filed at 04/09/15 1425  Gross per 24  hour  Intake 1993.33 ml  Output   2650 ml  Net -656.67 ml   Filed Weights   04/07/15 0528 04/07/15 1450  Weight: 108.41 kg (239 lb) 109.402 kg (241 lb 3 oz)    Exam:   General:  Obese appears comfortable  Cardiovascular: RRR no MGR NO LE edema  Respiratory: normal effort BS clear  Abdomen: obese soft +BS mild tenderness throughout. No guarding  Musculoskeletal: no clubbing or cyanosis   Data Reviewed: Basic Metabolic Panel:  Recent Labs Lab 04/06/15 1019 04/07/15 0651 04/08/15 0744 04/09/15 0929  NA 139 141 140 138  K 3.6 3.8 3.5 3.3*  CL 110 109 110 106  CO2 23 25 25 26   GLUCOSE 94 124* 101* 103*  BUN 11 8 5* <5*  CREATININE 0.58 0.48 0.54 0.52  CALCIUM 9.0 9.2 8.3* 8.9   Liver Function Tests:  Recent Labs Lab 04/06/15 1019 04/07/15 0651 04/08/15 0744 04/09/15 0929  AST 22 170* 44* 29  ALT 17 163* 80* 64*  ALKPHOS 89 138* 98 102  BILITOT 0.5 0.5 0.4 0.6  PROT 7.2 7.3 5.6* 6.2*  ALBUMIN 4.2 4.2 3.3* 3.7    Recent Labs Lab 04/07/15 0705  LIPASE 25    Recent Labs Lab 04/07/15 0651  AMMONIA 19   CBC:  Recent Labs Lab 04/06/15 1019 04/07/15 0651 04/08/15 0744 04/09/15 0929  WBC 4.1 4.0 5.3 4.0  NEUTROABS 1.5* 3.3  --   --   HGB 8.8* 9.2* 7.8* 8.4*  HCT 28.7* 30.6* 25.1* 27.6*  MCV 75.7* 76.9* 76.3* 76.7*  PLT 267 278 242 259   Cardiac Enzymes: No results for input(s): CKTOTAL, CKMB, CKMBINDEX, TROPONINI in the last 168 hours. BNP (last 3 results) No results for input(s): BNP in the last 8760 hours.  ProBNP (last  3 results) No results for input(s): PROBNP in the last 8760 hours.  CBG: No results for input(s): GLUCAP in the last 168 hours.  Recent Results (from the past 240 hour(s))  Urine culture     Status: None   Collection Time: 04/06/15 11:54 AM  Result Value Ref Range Status   Specimen Description URINE, CLEAN CATCH  Final   Special Requests Normal  Final   Colony Count   Final    15,000 COLONIES/ML Performed at  [facility]    Culture   Final    Multiple bacterial morphotypes present, none predominant. Suggest appropriate recollection if clinically indicated. Performed at [facility]    Report Status 04/07/2015 FINAL  Final  Clostridium Difficile by PCR     Status: None   Collection Time: 04/07/15  7:51 PM  Result Value Ref Range Status   C difficile by pcr NEGATIVE NEGATIVE Final     Studies: Dg Esophagus  04/08/2015   CLINICAL DATA:  Dysphagia.  Epigastric pain.  EXAM: ESOPHOGRAM / BARIUM SWALLOW / BARIUM TABLET STUDY  TECHNIQUE: Combined double contrast and single contrast examination performed using effervescent crystals, thick barium liquid, and thin barium liquid. The patient was observed with fluoroscopy swallowing a 13 mm barium sulphate tablet.  FLUOROSCOPY TIME:  Radiation Exposure Index (as provided by the fluoroscopic device):  If the device does not provide the exposure index:  Fluoroscopy Time:  2 minutes 48 seconds  Number of Acquired Images:  0  COMPARISON:  CT 04/07/2015  FINDINGS: Fluoroscopic evaluation of swallowing demonstrates normal esophageal peristalsis. No stricture, fold thickening or mass. No reflux with the water siphon maneuver. The patient swallowed a 13 mm barium tablet which briefly sticks in the distal esophagus despite no visible stricture. This reproduces the patient's symptoms.  IMPRESSION: Brief sticking of the 13 mm barium tablet in the distal esophagus, reproducing the patient's symptoms. No visible abnormality in this area.   Electronically Signed   By: Rolm Baptise M.D.   On: 04/08/2015 10:57   US Abdomen Limited Ruq  04/07/2015   CLINICAL DATA:  One month of abdominal pain with increased transaminases, history of cholecystectomy, irritable bowel disease, gastric bypass.  EXAM: US ABDOMEN LIMITED - RIGHT UPPER QUADRANT  COMPARISON:  Abdominal ultrasound of September 13, 2014  FINDINGS: The study is limited by the patient's body habitus, bowel gas, and  inability to hold her breath.  Gallbladder:  The gallbladder is surgically absent.  Common bile duct:  Diameter: 5.6 mm  Liver:  The hepatic echotexture is mildly increased. There is no focal mass or intrahepatic ductal dilation. Where visualized the surface contour is normal.  IMPRESSION: 1. Fatty infiltrative change of the liver. Within the limits of the study no hepatic parenchymal abnormality is demonstrated otherwise. 2. The gallbladder is surgically absent.   Electronically Signed   By: David  Martinique M.D.   On: 04/07/2015 16:45    Scheduled Meds: . DULoxetine  30 mg Oral Daily   And  . DULoxetine  60 mg Oral QHS  . pantoprazole  40 mg Oral BID AC  . saccharomyces boulardii  250 mg Oral BID  . topiramate  100 mg Oral QHS   Continuous Infusions: . sodium chloride    . 0.9 % NaCl with KCl 20 mEq / L 50 mL/hr at 04/09/15 0113    Active Problems:   Pseudoseizure   Hematemesis   Intractable vomiting with nausea   Abdominal pain   Microcytic anemia  Dysphagia    Time spent: 35 minutes    [facility] Pager [phone]. If 7PM-7AM, please contact night-coverage at [email or web address], password [facility] 04/09/2015, 3:56 PM  LOS: 2 days

## 2015-04-09 NOTE — Progress Notes (Signed)
Subjective: Able to tolerate broth yesterday for lunch and dinner. Continues to report epigastric discomfort, worsened by eating. Now with new ""twinge" in right-sided abdomen. Requesting stronger pain medication. N/V resolved. No further diarrhea. States her mother and maternal grandmother have hemochromatosis. Concerned about this.   Objective: Vital signs in last 24 hours: Temp:  [98.3 F (36.8 C)-98.4 F (36.9 C)] 98.3 F (36.8 C) (05/03 2112) Pulse Rate:  [65-67] 67 (05/03 2112) Resp:  [18-20] 20 (05/03 2112) BP: (122-134)/(76-97) 122/76 mmHg (05/03 2112) SpO2:  [98 %-99 %] 98 % (05/03 2112) Last BM Date: 04/07/15 General:   Alert and oriented, pleasant Abdomen:  Bowel sounds present, soft, mild TTP epigastric region, moderate TTP right-sided abdomen, non-distended. No HSM or hernias noted. No rebound or guarding. No masses appreciated  Extremities:  Without edema. Neurologic:  Alert and  oriented x4;  grossly normal neurologically. Psych:  Alert and cooperative. Normal mood and affect.  Intake/Output from previous day: 05/03 0701 - 05/04 0700 In: 2542.4 [P.O.:480; I.V.:1945.4; IV Piggyback:117] Out: 1550 [Urine:1550] Intake/Output this shift:    Lab Results:  Recent Labs  04/06/15 1019 04/07/15 0651 04/08/15 0744  WBC 4.1 4.0 5.3  HGB 8.8* 9.2* 7.8*  HCT 28.7* 30.6* 25.1*  PLT 267 278 242   BMET  Recent Labs  04/06/15 1019 04/07/15 0651 04/08/15 0744  NA 139 141 140  K 3.6 3.8 3.5  CL 110 109 110  CO2 23 25 25   GLUCOSE 94 124* 101*  BUN 11 8 5*  CREATININE 0.58 0.48 0.54  CALCIUM 9.0 9.2 8.3*   LFT  Recent Labs  04/06/15 1019 04/07/15 0651 04/08/15 0744  PROT 7.2 7.3 5.6*  ALBUMIN 4.2 4.2 3.3*  AST 22 170* 44*  ALT 17 163* 80*  ALKPHOS 89 138* 98  BILITOT 0.5 0.5 0.4   Hepatitis Panel  Recent Labs  04/07/15 1612  HEPBSAG NEGATIVE  HCVAB NEGATIVE   Lab Results  Component Value Date   IRON 14* 12/24/2013   TIBC 467 12/24/2013    FERRITIN 3* 12/24/2013    Studies/Results: Ct Abdomen Pelvis W Contrast  04/07/2015   CLINICAL DATA:  One day history of nausea and vomiting with 1 week history of epigastric pain and diarrhea.  EXAM: CT ABDOMEN AND PELVIS WITH CONTRAST  TECHNIQUE: Multidetector CT imaging of the abdomen and pelvis was performed using the standard protocol following bolus administration of intravenous contrast. Oral contrast was also administered.  CONTRAST:  23mL OMNIPAQUE IOHEXOL 300 MG/ML SOLN, 177mL OMNIPAQUE IOHEXOL 300 MG/ML SOLN  COMPARISON:  March 19, 2015  FINDINGS: There is atelectatic change in the left lung base. Lung bases otherwise clear.  Patient is status post gastric bypass procedure without complicating features. There is no wall thickening or perigastric fluid in the left upper quadrant region.  No focal liver lesions are identified. The gallbladder is absent. The distal common bile duct measures 11 mm, stable. There is no mass or calculus appreciable in the biliary ductal system.  Spleen, pancreas, and adrenals appear normal.  Kidneys bilaterally show no mass or hydronephrosis on either side. There is no renal or ureteral calculus on either side.  In the pelvis, the urinary bladder is midline with normal wall thickness. There is no pelvic mass or pelvic fluid collection. The appendix appears normal. Note that the recent right ovarian cyst is no longer appreciable.  There is no bowel obstruction. No free air or portal venous air. There is no ascites, adenopathy, or abscess  in the abdomen or pelvis. There is no demonstrable abdominal aortic aneurysm. There are no blastic or lytic bone lesions.  IMPRESSION: Patient is status post gastric bypass procedure without complicating feature appreciable. Currently there is no bowel obstruction. No abscess. Appendix region appears normal.  Mild common bile duct prominence is stable and is probably secondary to post cholecystectomy state. No biliary duct mass or  calculus seen.  Previously noted right ovarian cyst has resolved.  No renal or ureteral calculus.  No hydronephrosis.   Electronically Signed   By: Lowella Grip III M.D.   On: 04/07/2015 09:05   Dg Esophagus  04/08/2015   CLINICAL DATA:  Dysphagia.  Epigastric pain.  EXAM: ESOPHOGRAM / BARIUM SWALLOW / BARIUM TABLET STUDY  TECHNIQUE: Combined double contrast and single contrast examination performed using effervescent crystals, thick barium liquid, and thin barium liquid. The patient was observed with fluoroscopy swallowing a 13 mm barium sulphate tablet.  FLUOROSCOPY TIME:  Radiation Exposure Index (as provided by the fluoroscopic device):  If the device does not provide the exposure index:  Fluoroscopy Time:  2 minutes 48 seconds  Number of Acquired Images:  0  COMPARISON:  CT 04/07/2015  FINDINGS: Fluoroscopic evaluation of swallowing demonstrates normal esophageal peristalsis. No stricture, fold thickening or mass. No reflux with the water siphon maneuver. The patient swallowed a 13 mm barium tablet which briefly sticks in the distal esophagus despite no visible stricture. This reproduces the patient's symptoms.  IMPRESSION: Brief sticking of the 13 mm barium tablet in the distal esophagus, reproducing the patient's symptoms. No visible abnormality in this area.   Electronically Signed   By: Rolm Baptise M.D.   On: 04/08/2015 10:57   US Abdomen Limited Ruq  04/07/2015   CLINICAL DATA:  One month of abdominal pain with increased transaminases, history of cholecystectomy, irritable bowel disease, gastric bypass.  EXAM: US ABDOMEN LIMITED - RIGHT UPPER QUADRANT  COMPARISON:  Abdominal ultrasound of September 13, 2014  FINDINGS: The study is limited by the patient's body habitus, bowel gas, and inability to hold her breath.  Gallbladder:  The gallbladder is surgically absent.  Common bile duct:  Diameter: 5.6 mm  Liver:  The hepatic echotexture is mildly increased. There is no focal mass or intrahepatic ductal  dilation. Where visualized the surface contour is normal.  IMPRESSION: 1. Fatty infiltrative change of the liver. Within the limits of the study no hepatic parenchymal abnormality is demonstrated otherwise. 2. The gallbladder is surgically absent.   Electronically Signed   By: David  Martinique M.D.   On: 04/07/2015 16:45    Assessment: 29 year old female with acute on chronic abdominal pain, nausea, vomiting, s/p gastric bypass in the past, with associated hematemesis likely secondary to gastritis, possible ulcerative process, esophagitis, MW tear. N/V resolved since admission and tolerating full liquids. BPE yesterday with pill briefly hung in distal esophagus but no obvious stricture. Images reviewed by Dr. Oneida Alar and [facility] and felt to be related to only small sips of water. No endoscopy felt to be indicated yesterday. Discussed at length with patient: will attempt full liquids this morning and advance as tolerated. IF she is unable to advance diet, would recommend EGD +/- dilation while inpatient. With history of gastric bypass, increased risk for anastomotic ulcers, which could also contribute to abdominal pain and current symptomatology. AS OF NOTE: no internal hernia noted on CT scan. Reviewed personally with Dr. Ardeen Garland. Patient with multiple stressors, which are likely compounding symptoms as well. Not taking multivitamins/B12  as recommended s/p bariatric surgery. Will check routine nutritional labs.   Elevated LFTs: transient bumps noted with normalization documented. LFTs trending down. REPEAT LABS PENDING NOW. Known fatty liver and absent gallbladder. Prior work-up several years ago unrevealing and subsequent referral to [facility] recommended. Has not been evaluated at [facility]; we will arrange outpatient referral.   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 Drifts in Hgb likely multifactorial. Hematemesis reported prior to admission but no signs of overt GI bleeding currently. Ferritin 3  in Jan 2015. Received Feraheme yesterday. Outpatient ferrous sulfate 325 mg BID. Will likely need referral to hematology for iron infusions as outpatient.    Diarrhea: Cdiff PCR negative. History of alternating constipation/diarrhea. Likely IBS. No evidence of colitis on CT. No further diarrhea.   Plan: 1. Trial of full liquids. If unable to tolerate or advance diet, would recommend EGD prior to discharge. Would need Propofol.  2. PPI BID 3. Follow-up on pending CBC/CMP 4. Outpatient appointment at [facility] due to elevated LFTs 5. Continue probiotic 6. Check Vit D, B12, thiamine due to history of gastric bypass 7. Phenergan prn nausea 8. Outpatient referral to hematology for IV iron infusions   Orvil Feil, ANP-BC [facility]     LOS: 2 days    04/09/2015, 8:04 AM  Attending note:  Patient reports worsening of upper abdominal pain with food intake this afternoon.  I feel the best course of action would be to go ahead and offer a diagnostic EGD tomorrow. I discussed this approach with her The risks, benefits, limitations, alternatives and imponderables have been reviewed with the patient. Potential for esophageal dilation, biopsy, etc. have also been reviewed.  Questions have been answered. All parties agreeable.

## 2015-04-10 ENCOUNTER — Encounter (HOSPITAL_COMMUNITY)
Admission: EM | Disposition: A | Discharge status: Home / Self Care | Payer: Self-pay | Source: Home / Self Care | Attending: Internal Medicine

## 2015-04-10 ENCOUNTER — Inpatient Hospital Stay (HOSPITAL_COMMUNITY): Payer: 59 | Admitting: Anesthesiology

## 2015-04-10 ENCOUNTER — Encounter (HOSPITAL_COMMUNITY): Payer: Self-pay | Admitting: *Deleted

## 2015-04-10 DIAGNOSIS — R1319 Other dysphagia: Secondary | ICD-10-CM

## 2015-04-10 DIAGNOSIS — K289 Gastrojejunal ulcer, unspecified as acute or chronic, without hemorrhage or perforation: Secondary | ICD-10-CM

## 2015-04-10 HISTORY — PX: ESOPHAGEAL DILATION: SHX303

## 2015-04-10 HISTORY — PX: BIOPSY: SHX5522

## 2015-04-10 HISTORY — PX: ESOPHAGOGASTRODUODENOSCOPY (EGD) WITH PROPOFOL: SHX5813

## 2015-04-10 LAB — URINE CULTURE: SPECIAL REQUESTS: NORMAL

## 2015-04-10 LAB — COMPREHENSIVE METABOLIC PANEL
ALBUMIN: 4.2 g/dL (ref 3.5–5.0)
ALK PHOS: 89 U/L (ref 38–126)
ALK PHOS: 138 U/L — AB (ref 38–126)
ALT: 17 U/L (ref 14–54)
ALT: 163 U/L — AB (ref 14–54)
ALT: 80 U/L — ABNORMAL HIGH (ref 14–54)
ALT: 64 U/L — AB (ref 14–54)
ANION GAP: 6 (ref 5–15)
ANION GAP: 7 (ref 5–15)
ANION GAP: 6 (ref 5–15)
AST: 22 U/L (ref 15–41)
AST: 170 U/L — AB (ref 15–41)
AST: 44 U/L — AB (ref 15–41)
AST: 29 U/L (ref 15–41)
Albumin: 4.2 g/dL (ref 3.5–5.0)
Albumin: 3.3 g/dL — ABNORMAL LOW (ref 3.5–5.0)
Albumin: 3.7 g/dL (ref 3.5–5.0)
Alkaline Phosphatase: 98 U/L (ref 38–126)
Alkaline Phosphatase: 102 U/L (ref 38–126)
Anion gap: 5 (ref 5–15)
BILIRUBIN TOTAL: 0.5 mg/dL (ref 0.3–1.2)
BUN: 11 mg/dL (ref 6–20)
BUN: 8 mg/dL (ref 6–20)
BUN: 5 mg/dL — AB (ref 6–20)
BUN: <5 mg/dL — ABNORMAL LOW (ref 6–20)
CALCIUM: 8.3 mg/dL — AB (ref 8.9–10.3)
CALCIUM: 8.9 mg/dL (ref 8.9–10.3)
CHLORIDE: 109 mmol/L (ref 101–111)
CO2: 23 mmol/L (ref 22–32)
CO2: 25 mmol/L (ref 22–32)
CO2: 25 mmol/L (ref 22–32)
CO2: 26 mmol/L (ref 22–32)
CREATININE: 0.58 mg/dL (ref 0.44–1.00)
CREATININE: 0.48 mg/dL (ref 0.44–1.00)
Calcium: 9 mg/dL (ref 8.9–10.3)
Calcium: 9.2 mg/dL (ref 8.9–10.3)
Chloride: 110 mmol/L (ref 101–111)
Chloride: 110 mmol/L (ref 101–111)
Chloride: 106 mmol/L (ref 101–111)
Creatinine, Ser: 0.54 mg/dL (ref 0.44–1.00)
Creatinine, Ser: 0.52 mg/dL (ref 0.44–1.00)
GFR calc Af Amer: >60 mL/min (ref >60)
GFR calc Af Amer: >60 mL/min (ref >60)
GFR calc Af Amer: >60 mL/min (ref >60)
GFR calc non Af Amer: >60 mL/min (ref >60)
GFR calc non Af Amer: >60 mL/min (ref >60)
GFR calc non Af Amer: >60 mL/min (ref >60)
GFR calc non Af Amer: >60 mL/min (ref >60)
GLUCOSE: 94 mg/dL (ref 70–99)
Glucose, Bld: 124 mg/dL — ABNORMAL HIGH (ref 70–99)
Glucose, Bld: 101 mg/dL — ABNORMAL HIGH (ref 70–99)
Glucose, Bld: 103 mg/dL — ABNORMAL HIGH (ref 70–99)
POTASSIUM: 3.6 mmol/L (ref 3.5–5.1)
POTASSIUM: 3.8 mmol/L (ref 3.5–5.1)
Potassium: 3.5 mmol/L (ref 3.5–5.1)
Potassium: 3.3 mmol/L — ABNORMAL LOW (ref 3.5–5.1)
SODIUM: 138 mmol/L (ref 135–145)
Sodium: 139 mmol/L (ref 135–145)
Sodium: 141 mmol/L (ref 135–145)
Sodium: 140 mmol/L (ref 135–145)
TOTAL PROTEIN: 6.2 g/dL — AB (ref 6.5–8.1)
Total Bilirubin: 0.5 mg/dL (ref 0.3–1.2)
Total Bilirubin: 0.4 mg/dL (ref 0.3–1.2)
Total Bilirubin: 0.6 mg/dL (ref 0.3–1.2)
Total Protein: 7.2 g/dL (ref 6.5–8.1)
Total Protein: 7.3 g/dL (ref 6.5–8.1)
Total Protein: 5.6 g/dL — ABNORMAL LOW (ref 6.5–8.1)

## 2015-04-10 LAB — CBC WITH DIFFERENTIAL/PLATELET
BASOS ABS: 0 10*3/uL (ref 0.0–0.1)
BASOS ABS: 0 10*3/uL (ref 0.0–0.1)
BASOS PCT: 0 % (ref 0–1)
BASOS PCT: 0 % (ref 0–1)
EOS ABS: 0.2 10*3/uL (ref 0.0–0.7)
EOS PCT: 0 % (ref 0–5)
Eosinophils Absolute: 0 10*3/uL (ref 0.0–0.7)
Eosinophils Relative: 4 % (ref 0–5)
HCT: 28.7 % — ABNORMAL LOW (ref 36.0–46.0)
HEMATOCRIT: 30.6 % — AB (ref 36.0–46.0)
HEMOGLOBIN: 8.8 g/dL — AB (ref 12.0–15.0)
HEMOGLOBIN: 9.2 g/dL — AB (ref 12.0–15.0)
LYMPHS PCT: 48 % — AB (ref 12–46)
Lymphocytes Relative: 13 % (ref 12–46)
Lymphs Abs: 2 10*3/uL (ref 0.7–4.0)
Lymphs Abs: 0.5 10*3/uL — ABNORMAL LOW (ref 0.7–4.0)
MCH: 23.2 pg — ABNORMAL LOW (ref 26.0–34.0)
MCH: 23.1 pg — AB (ref 26.0–34.0)
MCHC: 30.7 g/dL (ref 30.0–36.0)
MCHC: 30.1 g/dL (ref 30.0–36.0)
MCV: 75.7 fL — ABNORMAL LOW (ref 78.0–100.0)
MCV: 76.9 fL — ABNORMAL LOW (ref 78.0–100.0)
MONO ABS: 0.5 10*3/uL (ref 0.1–1.0)
MONO ABS: 0.2 10*3/uL (ref 0.1–1.0)
MONOS PCT: 12 % (ref 3–12)
Monocytes Relative: 4 % (ref 3–12)
NEUTROS ABS: 1.5 10*3/uL — AB (ref 1.7–7.7)
NEUTROS ABS: 3.3 10*3/uL (ref 1.7–7.7)
Neutrophils Relative %: 36 % — ABNORMAL LOW (ref 43–77)
Neutrophils Relative %: 83 % — ABNORMAL HIGH (ref 43–77)
Platelets: 267 10*3/uL (ref 150–400)
Platelets: 278 10*3/uL (ref 150–400)
RBC: 3.79 MIL/uL — AB (ref 3.87–5.11)
RBC: 3.98 MIL/uL (ref 3.87–5.11)
RDW: 17.2 % — ABNORMAL HIGH (ref 11.5–15.5)
RDW: 17.2 % — ABNORMAL HIGH (ref 11.5–15.5)
WBC: 4.1 10*3/uL (ref 4.0–10.5)
WBC: 4 10*3/uL (ref 4.0–10.5)

## 2015-04-10 LAB — BASIC METABOLIC PANEL
ANION GAP: 7 (ref 5–15)
BUN: <5 mg/dL — ABNORMAL LOW (ref 6–20)
CHLORIDE: 106 mmol/L (ref 101–111)
CO2: 26 mmol/L (ref 22–32)
CREATININE: 0.69 mg/dL (ref 0.44–1.00)
Calcium: 9.2 mg/dL (ref 8.9–10.3)
Glucose, Bld: 106 mg/dL — ABNORMAL HIGH (ref 70–99)
Potassium: 4.1 mmol/L (ref 3.5–5.1)
Sodium: 139 mmol/L (ref 135–145)

## 2015-04-10 LAB — VITAMIN B1: VITAMIN B1 (THIAMINE): 83.3 nmol/L (ref 66.5–200.0)

## 2015-04-10 LAB — CBC
HCT: 25.1 % — ABNORMAL LOW (ref 36.0–46.0)
HEMATOCRIT: 27.6 % — AB (ref 36.0–46.0)
HEMATOCRIT: 29.2 % — AB (ref 36.0–46.0)
HEMOGLOBIN: 7.8 g/dL — AB (ref 12.0–15.0)
HEMOGLOBIN: 8.4 g/dL — AB (ref 12.0–15.0)
HEMOGLOBIN: 8.9 g/dL — AB (ref 12.0–15.0)
MCH: 23.7 pg — ABNORMAL LOW (ref 26.0–34.0)
MCH: 23.3 pg — ABNORMAL LOW (ref 26.0–34.0)
MCH: 23.4 pg — ABNORMAL LOW (ref 26.0–34.0)
MCHC: 31.1 g/dL (ref 30.0–36.0)
MCHC: 30.4 g/dL (ref 30.0–36.0)
MCHC: 30.5 g/dL (ref 30.0–36.0)
MCV: 76.3 fL — ABNORMAL LOW (ref 78.0–100.0)
MCV: 76.7 fL — ABNORMAL LOW (ref 78.0–100.0)
MCV: 76.6 fL — AB (ref 78.0–100.0)
Platelets: 242 10*3/uL (ref 150–400)
Platelets: 259 10*3/uL (ref 150–400)
Platelets: 327 10*3/uL (ref 150–400)
RBC: 3.29 MIL/uL — ABNORMAL LOW (ref 3.87–5.11)
RBC: 3.6 MIL/uL — ABNORMAL LOW (ref 3.87–5.11)
RBC: 3.81 MIL/uL — ABNORMAL LOW (ref 3.87–5.11)
RDW: 17.3 % — AB (ref 11.5–15.5)
RDW: 16.9 % — AB (ref 11.5–15.5)
RDW: 17.1 % — ABNORMAL HIGH (ref 11.5–15.5)
WBC: 5.3 10*3/uL (ref 4.0–10.5)
WBC: 4 10*3/uL (ref 4.0–10.5)
WBC: 3.7 10*3/uL — AB (ref 4.0–10.5)

## 2015-04-10 LAB — FECAL LACTOFERRIN, QUANT

## 2015-04-10 LAB — VITAMIN D 25 HYDROXY (VIT D DEFICIENCY, FRACTURES): Vit D, 25-Hydroxy: 31.8 ng/mL (ref 30.0–100.0)

## 2015-04-10 LAB — HIV ANTIBODY (ROUTINE TESTING W REFLEX): HIV Screen 4th Generation wRfx: NONREACTIVE

## 2015-04-10 SURGERY — ESOPHAGOGASTRODUODENOSCOPY (EGD) WITH PROPOFOL
Anesthesia: Monitor Anesthesia Care

## 2015-04-10 MED ORDER — FENTANYL CITRATE (PF) 100 MCG/2ML IJ SOLN
INTRAMUSCULAR | Status: AC
  Filled 2015-04-10: qty 2

## 2015-04-10 MED ORDER — MIDAZOLAM HCL 5 MG/5ML IJ SOLN
INTRAMUSCULAR | Status: DC | PRN
  Administered 2015-04-10 (×2): 1 mg via INTRAVENOUS

## 2015-04-10 MED ORDER — MIDAZOLAM HCL 2 MG/2ML IJ SOLN
INTRAMUSCULAR | Status: AC
  Filled 2015-04-10: qty 2

## 2015-04-10 MED ORDER — LIDOCAINE VISCOUS 2 % MT SOLN
15.0000 mL | Freq: Once | OROMUCOSAL | Status: AC
  Administered 2015-04-10: 15 mL via OROMUCOSAL

## 2015-04-10 MED ORDER — FENTANYL CITRATE (PF) 100 MCG/2ML IJ SOLN
25.0000 ug | INTRAMUSCULAR | Status: AC
  Administered 2015-04-10 (×2): 25 ug via INTRAVENOUS

## 2015-04-10 MED ORDER — LIDOCAINE VISCOUS 2 % MT SOLN
OROMUCOSAL | Status: AC
  Filled 2015-04-10: qty 15

## 2015-04-10 MED ORDER — PROPOFOL 10 MG/ML IV BOLUS
INTRAVENOUS | Status: AC
  Filled 2015-04-10: qty 20

## 2015-04-10 MED ORDER — FENTANYL CITRATE (PF) 100 MCG/2ML IJ SOLN
25.0000 ug | INTRAMUSCULAR | Status: DC | PRN
  Administered 2015-04-10 (×3): 50 ug via INTRAVENOUS

## 2015-04-10 MED ORDER — CLONAZEPAM 0.5 MG PO TABS: 0.5000 mg | ORAL_TABLET | Freq: Three times a day (TID) | ORAL | Status: DC | PRN

## 2015-04-10 MED ORDER — SUCRALFATE 1 GM/10ML PO SUSP
1.0000 g | Freq: Three times a day (TID) | ORAL | Status: DC
  Administered 2015-04-10: 1 g via ORAL
  Filled 2015-04-10: qty 10

## 2015-04-10 MED ORDER — MIDAZOLAM HCL 2 MG/2ML IJ SOLN
1.0000 mg | INTRAMUSCULAR | Status: DC | PRN
  Administered 2015-04-10: 2 mg via INTRAVENOUS
  Filled 2015-04-10: qty 2

## 2015-04-10 MED ORDER — PANTOPRAZOLE SODIUM 40 MG PO TBEC: 40.0000 mg | DELAYED_RELEASE_TABLET | Freq: Two times a day (BID) | ORAL | Status: DC

## 2015-04-10 MED ORDER — STERILE WATER FOR IRRIGATION IR SOLN
Status: DC | PRN
  Administered 2015-04-10: 1000 mL

## 2015-04-10 MED ORDER — FENTANYL CITRATE (PF) 100 MCG/2ML IJ SOLN
INTRAMUSCULAR | Status: AC
Start: 2015-04-10 — End: 2015-04-10
  Filled 2015-04-10: qty 2

## 2015-04-10 MED ORDER — LACTATED RINGERS IV SOLN
INTRAVENOUS | Status: DC
  Administered 2015-04-10: 1000 mL via INTRAVENOUS

## 2015-04-10 MED ORDER — PROPOFOL INFUSION 10 MG/ML OPTIME
INTRAVENOUS | Status: DC | PRN
  Administered 2015-04-10 (×2): 125 ug/kg/min via INTRAVENOUS

## 2015-04-10 MED ORDER — SUCRALFATE 1 GM/10ML PO SUSP: 1.0000 g | Freq: Three times a day (TID) | ORAL | Status: DC

## 2015-04-10 SURGICAL SUPPLY — 8 items
BLOCK BITE 60FR ADLT L/F BLUE (MISCELLANEOUS) ×1 IMPLANT
FORCEPS BIOP RAD 4 LRG CAP 4 (CUTTING FORCEPS) ×1 IMPLANT
FORMALIN 10 PREFIL 20ML (MISCELLANEOUS) ×1 IMPLANT
KIT CLEAN ENDO COMPLIANCE (KITS) ×1 IMPLANT
MANIFOLD NEPTUNE II (INSTRUMENTS) ×1 IMPLANT
SYR 50ML LL SCALE MARK (SYRINGE) ×1 IMPLANT
TUBING IRRIGATION ENDOGATOR (MISCELLANEOUS) ×1 IMPLANT
WATER STERILE IRR 1000ML POUR (IV SOLUTION) ×1 IMPLANT

## 2015-04-10 NOTE — Discharge Summary (Signed)
Physician Discharge Summary  Penny [name] [facility] EMV:[id number] DOB: December 08, 1985 DOA: 04/07/2015  PCP: [name], MD  Admit date: 04/07/2015 Discharge date: 04/10/2015  Time spent: 40 minutes  Recommendations for Outpatient Follow-up:  1. Follow up with Dr Gala Romney 4 weeks to follow biopsy and evaluation of symptoms   Discharge Diagnoses:  Principal Problem:   Abdominal pain Active Problems:   Pseudoseizure   Hematemesis   Intractable vomiting with nausea   Microcytic anemia   Dysphagia   Anastomotic ulcer   Discharge Condition: stable  Diet recommendation: bariatric full liquid to be advanced by patient as tolerated  Filed Weights   04/07/15 0528 04/07/15 1450 04/10/15 0914  Weight: 108.41 kg (239 lb) 109.402 kg (241 lb 3 oz) 109.317 kg (241 lb)    History of present illness:  29 year old female with a history of pseudoseizure, depression, anxiety, gastric bypass presented on 04/07/15 with one-month history of lower abdominal pain. The patient presented to the emergency department on 04/06/2015 with nausea, vomiting, and abdominal pain. The patient was sent home with instruction to follow-up with gastroenterology in the outpatient setting. The patient apparently had an appointment to see Dr. Oneida Alar on 04/08/2015. She stated that she had been having vomiting for approximately 3 days. However on the morning of admission, the patient developed hematemesis. She stated that vomited 5 times with coffee grounds material and occasionally saw some blood. In addition, the patient had been complaining of diarrhea for  24 hours. She stated that she  had 10 bowel movements without any hematochezia or melena. Interestingly, it appeared that the patient changed her story even during admission interview. Initially, the patient stated that she had had only one episode of hematemesis and had only 24 hours duration of nausea and vomiting. The patient took some hydrocodone which did not help. She denied any  other NSAID use. She denied any fevers, chills, chest pain, shortness breath, hemoptysis, dysuria, hematuria. She denied any recent travels or sick contacts.  While in the emergency department, the patient had not had any bowel movements or episodes of emesis. The patient was afebrile and hemodynamically stable. BMP was unremarkable and CBC showed stable hemoglobin of 9.2. Lactic acid was 0.55. However the patient did have elevated hepatic enzymes with AST 170, ALT 163, total bilirubin 0.5, alk phosphatase 138. The patient was given 2 L normal saline and Compazine. Admission was requested for further workup. Lipase 25  Hospital Course:  Intractable nausea, vomiting, diarrhea with hematemesis -Barium pill study showed normal peristalsis, no visible structure but there was a brief sticking of the barium pill in the distal esophagus. CT abdomen and pelvis negative for any acute findings.Stool for C. difficile PCR--neg EGD anastomotic ulcer present?"status post biopsy. Recommending PPI BID and carafate, avoid NSAIDS. Hemoglobin remained stable.n is 9 --pt requested increase in dilaudid dose but appeared very comfortable. Discussion on drug seeking behavior.  Transaminasemia - resolving at discharge. 04/07/15--CT abdomen and pelvis shows common bile duct 11 mm--she is status post cholecystectomy -Hepatitis B surface antigen--neg -Hepatitis C antibody--neg -HIV antibody--neg -RUQ US--fatty liver -Prior work-up several years ago unrevealing and subsequent referral to [facility] recommended per GI Morbid obesity -BMI 38 Impaired glucose tolerance -01/15/2015 hemoglobin A1c 5.7 Anemia Chronic disease. Hg stable. See #1.    Procedures: 04/10/15 EGDNormal-appearing esophagus?"status post passage of a Maloney dilator. Surgically altered stomach as described above. Anastomotic ulcer present?"status post biopsy  Consultations:  gastroenterology  Discharge Exam: Filed Vitals:   04/10/15 1203  BP:  134/82  Pulse:  78  Temp: 97.9 F (36.6 C)  Resp: 18    General: obese appears comfortable Cardiovascular: RRR no MGR No LE edema Respiratory: normal effort BS clear Abdomen: obese soft +BS diffuse tenderness particularly right upper quadrant no guarding or rebounding  Discharge Instructions    Current Discharge Medication List    START taking these medications   Details  pantoprazole (PROTONIX) 40 MG tablet Take 1 tablet (40 mg total) by mouth 2 (two) times daily before a meal. Qty: 60 tablet, Refills: 1    sucralfate (CARAFATE) 1 GM/10ML suspension Take 10 mLs (1 g total) by mouth 4 (four) times daily -  with meals and at bedtime. Qty: 420 mL, Refills: 0      CONTINUE these medications which have CHANGED   Details  clonazePAM (KLONOPIN) 0.5 MG tablet Take 1 tablet (0.5 mg total) by mouth 3 (three) times daily as needed for anxiety. Qty: 90 tablet, Refills: 3      CONTINUE these medications which have NOT CHANGED   Details  cyclobenzaprine (FLEXERIL) 5 MG tablet TAKE 1 TABLET BY MOUTH EVERY EIGHT HOURS AS NEEDED FOR MUSCLE SPASMS. Qty: 90 tablet, Refills: 3    DULoxetine (CYMBALTA) 30 MG capsule Take 1-2 capsules (30-60 mg total) by mouth 2 (two) times daily. Take 1 capsule (30 mg) every morning and 2 capsules (60 mg) every night Qty: 90 capsule, Refills: 1    ferrous sulfate 325 (65 FE) MG tablet Take 1 tablet (325 mg total) by mouth 2 (two) times daily with a meal. Qty: 30 tablet, Refills: 0    oxyCODONE-acetaminophen (PERCOCET) 5-325 MG per tablet Take 1 tablet by mouth every 4 (four) hours as needed for severe pain. Qty: 20 tablet, Refills: 0    promethazine (PHENERGAN) 12.5 MG tablet Take 1 tablet (12.5 mg total) by mouth every 6 (six) hours as needed for nausea or vomiting. Qty: 15 tablet, Refills: 0    topiramate (TOPAMAX) 100 MG tablet Take 1 tablet (100 mg total) by mouth at bedtime. Qty: 30 tablet, Refills: 3    zolpidem (AMBIEN) 5 MG tablet Take 1  tablet (5 mg total) by mouth at bedtime as needed for sleep. Qty: 30 tablet, Refills: 2      STOP taking these medications     esomeprazole (NEXIUM) 40 MG capsule      Clobetasol Prop Emollient Base 0.05 % emollient cream      HYDROcodone-acetaminophen (NORCO/VICODIN) 5-325 MG per tablet      ketorolac (TORADOL) 10 MG tablet      sucralfate (CARAFATE) 1 G tablet        Allergies  Allergen Reactions  . Gabapentin Other (See Comments)    Pt states that she was unresponsive after taking this medication, but her vitals remained stable.    . Metoclopramide Hcl Anxiety and Other (See Comments)    Pt states that she felt like she was trapped in a box, and could not get out.  Pt also states that she had temporary loss of movement, weakness, and tingling.    . Codeine Nausea And Vomiting  . Zofran [Ondansetron] Other (See Comments)    Migraines   . Latex Rash  . Tape Itching and Rash    Please use paper tape   Follow-up Information    Follow up with Manus Rudd, MD. Schedule an appointment as soon as possible for a visit in 4 weeks.   Specialty:  Gastroenterology   Why:  follow biopsies   Contact information:  [address] [place] [address] [phone]        The results of significant diagnostics from this hospitalization (including imaging, microbiology, ancillary and laboratory) are listed below for reference.    Significant Diagnostic Studies: Ct Abdomen Pelvis W Contrast  04/07/2015   CLINICAL DATA:  One day history of nausea and vomiting with 1 week history of epigastric pain and diarrhea.  EXAM: CT ABDOMEN AND PELVIS WITH CONTRAST  TECHNIQUE: Multidetector CT imaging of the abdomen and pelvis was performed using the standard protocol following bolus administration of intravenous contrast. Oral contrast was also administered.  CONTRAST:  32mL OMNIPAQUE IOHEXOL 300 MG/ML SOLN, OMNIPAQUE IOHEXOL 300 MG/ML SOLN  COMPARISON:  March 19, 2015  FINDINGS: There is  atelectatic change in the left lung base. Lung bases otherwise clear.  Patient is status post gastric bypass procedure without complicating features. There is no wall thickening or perigastric fluid in the left upper quadrant region.  No focal liver lesions are identified. The gallbladder is absent. The distal common bile duct measures 11 mm, stable. There is no mass or calculus appreciable in the biliary ductal system.  Spleen, pancreas, and adrenals appear normal.  Kidneys bilaterally show no mass or hydronephrosis on either side. There is no renal or ureteral calculus on either side.  In the pelvis, the urinary bladder is midline with normal wall thickness. There is no pelvic mass or pelvic fluid collection. The appendix appears normal. Note that the recent right ovarian cyst is no longer appreciable.  There is no bowel obstruction. No free air or portal venous air. There is no ascites, adenopathy, or abscess in the abdomen or pelvis. There is no demonstrable abdominal aortic aneurysm. There are no blastic or lytic bone lesions.  IMPRESSION: Patient is status post gastric bypass procedure without complicating feature appreciable. Currently there is no bowel obstruction. No abscess. Appendix region appears normal.  Mild common bile duct prominence is stable and is probably secondary to post cholecystectomy state. No biliary duct mass or calculus seen.  Previously noted right ovarian cyst has resolved.  No renal or ureteral calculus.  No hydronephrosis.   Electronically Signed   By: Bretta Bang III M.D.   On: 04/07/2015 09:05   Ct Abdomen Pelvis W Contrast  03/19/2015   CLINICAL DATA:  Upper abdominal pain with nausea and vomiting for 3 days  EXAM: CT ABDOMEN AND PELVIS WITH CONTRAST  TECHNIQUE: Multidetector CT imaging of the abdomen and pelvis was performed using the standard protocol following bolus administration of intravenous contrast.  CONTRAST:  OMNIPAQUE IOHEXOL 300 MG/ML  SOLN  COMPARISON:   09/03/2014  FINDINGS: BODY WALL: No contributory findings.  LOWER CHEST: No contributory findings.  ABDOMEN/PELVIS:  Liver: No focal abnormality.  Biliary: Cholecystectomy with chronic mild intra and extrahepatic biliary dilatation, likely reservoir effect given stability.  Pancreas: Unremarkable.  Spleen: Unremarkable.  Adrenals: Unremarkable.  Kidneys and ureters: No hydronephrosis or stone.  Bladder: Unremarkable.  Reproductive: Interval normalization of the uterus. There is a simple appearing 4 cm cyst in the right ovary. Given development since September 2015 this is consistent with a benign, likely physiologic process.  Bowel: Roux-en-Y gastric bypass. No evidence of gastrogastric fistula, marginal ulcer, or obstruction. Normal appendix.  Retroperitoneum: No mass or adenopathy.  Peritoneum: No ascites or pneumoperitoneum.  Vascular: No acute abnormality.  OSSEOUS: No acute abnormalities.  IMPRESSION: 1. No acute findings. 2. 4 cm right ovarian cyst. 3. Chronic and postoperative changes are described above.  Electronically Signed   By: Monte Fantasia M.D.   On: 03/19/2015 06:55   Dg Esophagus  04/08/2015   CLINICAL DATA:  Dysphagia.  Epigastric pain.  EXAM: ESOPHOGRAM / BARIUM SWALLOW / BARIUM TABLET STUDY  TECHNIQUE: Combined double contrast and single contrast examination performed using effervescent crystals, thick barium liquid, and thin barium liquid. The patient was observed with fluoroscopy swallowing a 13 mm barium sulphate tablet.  FLUOROSCOPY TIME:  Radiation Exposure Index (as provided by the fluoroscopic device):  If the device does not provide the exposure index:  Fluoroscopy Time:  2 minutes 48 seconds  Number of Acquired Images:  0  COMPARISON:  CT 04/07/2015  FINDINGS: Fluoroscopic evaluation of swallowing demonstrates normal esophageal peristalsis. No stricture, fold thickening or mass. No reflux with the water siphon maneuver. The patient swallowed a 13 mm barium tablet which briefly  sticks in the distal esophagus despite no visible stricture. This reproduces the patient's symptoms.  IMPRESSION: Brief sticking of the 13 mm barium tablet in the distal esophagus, reproducing the patient's symptoms. No visible abnormality in this area.   Electronically Signed   By: Rolm Baptise M.D.   On: 04/08/2015 10:57   Dg Abd Acute W/chest  03/20/2015   CLINICAL DATA:  Abdominal pain with vomiting and diarrhea for 3 days. Gastric ulcers.  EXAM: DG ABDOMEN ACUTE W/ 1V CHEST  COMPARISON:  Abdominal CT from 1 day prior  FINDINGS: There is no evidence of dilated bowel loops or free intraperitoneal air. No radiopaque calculi or other significant radiographic abnormality is seen. There are changes of gastric bypass and cholecystectomy. Heart size and mediastinal contours are within normal limits. Both lungs are clear.  IMPRESSION: 1. Negative abdominal radiographs. No acute cardiopulmonary disease. 2. Cholecystectomy and gastric bypass.   Electronically Signed   By: Monte Fantasia M.D.   On: 03/20/2015 06:52   US Abdomen Limited Ruq  04/07/2015   CLINICAL DATA:  One month of abdominal pain with increased transaminases, history of cholecystectomy, irritable bowel disease, gastric bypass.  EXAM: US ABDOMEN LIMITED - RIGHT UPPER QUADRANT  COMPARISON:  Abdominal ultrasound of September 13, 2014  FINDINGS: The study is limited by the patient's body habitus, bowel gas, and inability to hold her breath.  Gallbladder:  The gallbladder is surgically absent.  Common bile duct:  Diameter: 5.6 mm  Liver:  The hepatic echotexture is mildly increased. There is no focal mass or intrahepatic ductal dilation. Where visualized the surface contour is normal.  IMPRESSION: 1. Fatty infiltrative change of the liver. Within the limits of the study no hepatic parenchymal abnormality is demonstrated otherwise. 2. The gallbladder is surgically absent.   Electronically Signed   By: David  Martinique M.D.   On: 04/07/2015 16:45     Microbiology: Recent Results (from the past 240 hour(s))  Urine culture     Status: None   Collection Time: 04/06/15 11:54 AM  Result Value Ref Range Status   Specimen Description URINE, CLEAN CATCH  Final   Special Requests Normal  Final   Colony Count   Final    15,000 COLONIES/ML DEMOGRAPHIC UPDATE OCCURRED ON 05/05 AT 0924, QA FLAGS AND RANGES MAY NO LONGER BE VALID DEMOGRAPHIC UPDATE OCCURRED ON 05/05 AT 0925, QA FLAGS AND RANGES MAY NO LONGER BE VALID Performed at [facility]    Culture   Final    Multiple bacterial morphotypes present, none predominant. Suggest appropriate recollection if clinically indicated. DEMOGRAPHIC UPDATE OCCURRED ON 05/05 AT 4920, QA  FLAGS AND RANGES MAY NO LONGER BE VALID DEMOGRAPHIC UPDATE OCCURRED ON 05/05 AT 0925, QA  FLAGS AND RANGES MAY NO LONGER BE VALID Performed at [facility]    Report Status   Final    04/07/2015 FINAL DEMOGRAPHIC UPDATE OCCURRED ON 05/05 AT 0924, QA FLAGS AND RANGES MAY NO LONGER BE VALID DEMOGRAPHIC UPDATE OCCURRED ON 05/05 AT 0925, QA FLAGS AND RANGES MAY NO LONGER BE VALID  Clostridium Difficile by PCR     Status: None   Collection Time: 04/07/15  7:51 PM  Result Value Ref Range Status   C difficile by pcr NEGATIVE NEGATIVE Final     Labs: Basic Metabolic Panel:  Recent Labs Lab 04/06/15 1019 04/07/15 0651 04/08/15 0744 04/09/15 0929 04/10/15 0743  NA 139 141 140 138 139  K 3.6 3.8 3.5 3.3* 4.1  CL 110 109 110 106 106  CO2 $Re'23 25 25 26 26  'yMi$ GLUCOSE 94 124* 101* 103* 106*  BUN 11 8 5* <5* <5*  CREATININE 0.58 0.48 0.54 0.52 0.69  CALCIUM 9.0 9.2 8.3* 8.9 9.2   Liver Function Tests:  Recent Labs Lab 04/06/15 1019 04/07/15 0651 04/08/15 0744 04/09/15 0929  AST 22 170* 44* 29  ALT 17 163* 80* 64*  ALKPHOS 89 138* 98 102  BILITOT 0.5 0.5 0.4 0.6  PROT 7.2 7.3 5.6* 6.2*  ALBUMIN 4.2 4.2 3.3* 3.7    Recent Labs Lab 04/07/15 0705  LIPASE 25    Recent Labs Lab 04/07/15 0651   AMMONIA 19   CBC:  Recent Labs Lab 04/06/15 1019 04/07/15 0651 04/08/15 0744 04/09/15 0929 04/10/15 0743  WBC 4.1 4.0 5.3 4.0 3.7*  NEUTROABS 1.5* 3.3  --   --   --   HGB 8.8* 9.2* 7.8* 8.4* 8.9*  HCT 28.7* 30.6* 25.1* 27.6* 29.2*  MCV 75.7* 76.9* 76.3* 76.7* 76.6*  PLT 267 278 242 259 327   Cardiac Enzymes: No results for input(s): CKTOTAL, CKMB, CKMBINDEX, TROPONINI in the last 168 hours. BNP: BNP (last 3 results) No results for input(s): BNP in the last 8760 hours.  ProBNP (last 3 results) No results for input(s): PROBNP in the last 8760 hours.  CBG: No results for input(s): GLUCAP in the last 168 hours.     SignedRadene Gunning  [facility] 04/10/2015, 1:54 PM

## 2015-04-10 NOTE — Interval H&P Note (Signed)
History and Physical Interval Note:  04/10/2015 10:38 AM  Penny [name]  has presented today for surgery, with the diagnosis of epigastric pain, N/V, dysphagia  The various methods of treatment have been discussed with the patient and family. After consideration of risks, benefits and other options for treatment, the patient has consented to  Procedure(s) with comments: ESOPHAGOGASTRODUODENOSCOPY (EGD) WITH PROPOFOL (N/A) - WITH POSSIBLE DILATION as a surgical intervention .  The patient's history has been reviewed, patient examined, no change in status, stable for surgery.  I have reviewed the patient's chart and labs.  Questions were answered to the patient's satisfaction.     Penny [name]  No change overnight. EGD with esophageal dilation as appropriate, etc. per plan today.The risks, benefits, limitations, alternatives and imponderables have been reviewed with the patient. Potential for esophageal dilation, biopsy, etc. have also been reviewed.  Questions have been answered. All parties agreeable.

## 2015-04-10 NOTE — Progress Notes (Signed)
Discharge instructions and prescription given, verbalized understanding, out in stable condition ambulatory. 

## 2015-04-10 NOTE — Anesthesia Preprocedure Evaluation (Signed)
Anesthesia Evaluation  Patient identified by MRN, date of birth, ID band Patient awake    Reviewed: Allergy & Precautions, H&P , Patient's Chart, lab work & pertinent test results  History of Anesthesia Complications (+) PONV and history of anesthetic complications  Airway Mallampati: II  TM Distance: >3 FB Neck ROM: full    Dental  (+) Teeth Intact   Pulmonary Current Smoker, former smoker,  breath sounds clear to auscultation        Cardiovascular hypertension, On Medications Rhythm:regular Rate:Normal     Neuro/Psych  Headaches, Seizures -,  PSYCHIATRIC DISORDERS (ADD) Anxiety Depression  Neuromuscular disease    GI/Hepatic S/p gastric bypass Hematemesisi, N/V   Endo/Other  Morbid obesity  Renal/GU      Musculoskeletal  (+) Fibromyalgia -  Abdominal   Peds  Hematology  (+) anemia ,   Anesthesia Other Findings       Reproductive/Obstetrics                             Anesthesia Physical Anesthesia Plan  ASA: III  Anesthesia Plan: MAC   Post-op Pain Management:    Induction: Intravenous  Airway Management Planned: Simple Face Mask  Additional Equipment:   Intra-op Plan:   Post-operative Plan:   Informed Consent: I have reviewed the patients History and Physical, chart, labs and discussed the procedure including the risks, benefits and alternatives for the proposed anesthesia with the patient or authorized representative who has indicated his/her understanding and acceptance.     Plan Discussed with:   Anesthesia Plan Comments:         Anesthesia Quick Evaluation

## 2015-04-10 NOTE — Anesthesia Postprocedure Evaluation (Signed)
  Anesthesia Post-op Note  [name]  Procedure(s) Performed: Procedure(s): ESOPHAGOGASTRODUODENOSCOPY (EGD) WITH PROPOFOL (N/A) ESOPHAGEAL DILATION WITH 54FR MALONEY DILATOR (N/A) BIOPSY  Patient Location: PACU  Anesthesia Type:MAC  Level of Consciousness: awake, alert  and oriented  Airway and Oxygen Therapy: Patient Spontanous Breathing and Patient connected to nasal cannula oxygen  Post-op Pain: moderate  Post-op Assessment: Post-op Vital signs reviewed, Patient's Cardiovascular Status Stable, Respiratory Function Stable, Patent Airway and No signs of Nausea or vomiting  Post-op Vital Signs: Reviewed and stable  Last Vitals:  Filed Vitals:   04/10/15 1035  BP: 115/75  Pulse:   Temp:   Resp: 21    Complications: No apparent anesthesia complications

## 2015-04-10 NOTE — Care Management Note (Signed)
Case Management Note  Patient Details  Name: Penny [name] MRN: [id number] Date of Birth: [birth year]   Expected Discharge Date:  04/09/15               Expected Discharge Plan:  Home/Self Care  In-House Referral:  Financial Counselor  Discharge planning Services  CM Consult  Post Acute Care Choice:    Choice offered to:     DME Arranged:    DME Agency:     HH Arranged:    [place] Agency:     Status of Service:  Completed, signed off  Medicare Important Message Given:    Date Medicare IM Given:    Medicare IM give by:    Date Additional Medicare IM Given:    Additional Medicare Important Message give by:     If discussed at [place] of Stay Meetings, dates discussed:    Additional Comments:  Per FC, pt is insured. Pt to be DC's home today. Pt has no CM needs prior to discharge.   Sherald Barge, RN 04/10/2015, 1:14 PM

## 2015-04-10 NOTE — Op Note (Signed)
[place], [id number]   ENDOSCOPY PROCEDURE REPORT  [name], Penny [name]  MR#: #[id number] BIRTHDATE: 1986/07/09 , 28  yrs. old GENDER: female ENDOSCOPIST: R.  Garfield Cornea, MD FACP FACG REFERRED BY:  [name], M.D. PROCEDURE DATE:  2015/05/06 PROCEDURE:  EGD w/ biopsy and Maloney dilation of esophagus INDICATIONS:  Esophageal dysphagia; epigastric pain. MEDICATIONS: Deep sedation per Dr.  [facility] ASA CLASS:      Class II  CONSENT: The risks, benefits, limitations, alternatives and imponderables have been discussed.  The potential for biopsy, esophogeal dilation, etc. have also been reviewed.  Questions have been answered.  All parties agreeable.  Please see the history and physical in the medical record for more information.  DESCRIPTION OF PROCEDURE: After the risks benefits and alternatives of the procedure were thoroughly explained, informed consent was obtained.  The    endoscope was introduced through the mouth and advanced to the second portion of the duodenum , limited by Without limitations. The instrument was slowly withdrawn as the mucosa was fully examined.    Normal-appearing, patent tubular esophagus.  Stomach empty.  Stomach surgically altered with a Billroth II configuration.  Some scarring at the anastomosis on the gastric side.  Just on the small bowel side there was a 8 mm triangular-shaped ulcer with a clean base. It appeared to be benign.  Please see photos.  Both efferent and afferent limbs were normal appearing and patent and good 20 cm  Scope was removed.  A 54 French Maloney dilator was passed to full insertion easily.  A low back revealed a small amount of blood in the gastric pouch; otherwise, no apparent complication related to this maneuver.  Finally, the anastomotic ulcer was biopsied. The scope was then withdrawn from the patient and the procedure completed.  COMPLICATIONS:  There were no immediate complications.  ENDOSCOPIC IMPRESSION: Normal-appearing esophagus?"status post passage of a Maloney dilator. Surgically altered stomach as described above. Anastomotic ulcer present?"status post biopsy  RECOMMENDATIONS: Continue twice a day PPI therapy. Add Carafate suspension 4 times a day. Follow up on pathology. Avoid all NSAIDs. Advanced to a bariatric diet.  REPEAT EXAM:  eSigned:  R. Garfield Cornea, MD Rosalita Chessman [facility] 2015/05/06 11:19 AM    CC:  CPT CODES: ICD CODES:  The ICD and CPT codes recommended by this software are interpretations from the data that the clinical staff has captured with the software.  The verification of the translation of this report to the ICD and CPT codes and modifiers is the sole responsibility of the health care institution and practicing physician where this report was generated.  [employer] will not be held responsible for the validity of the ICD and CPT codes included on this report.  AMA assumes no liability for data contained or not contained herein. CPT is a Designer, television/film set of the [employer].  PATIENT NAME:  Penny [name], Penny [name] MR#: #[id number]

## 2015-04-10 NOTE — H&P (View-Only) (Signed)
Subjective: Able to tolerate broth yesterday for lunch and dinner. Continues to report epigastric discomfort, worsened by eating. Now with new ""twinge" in right-sided abdomen. Requesting stronger pain medication. N/V resolved. No further diarrhea. States her mother and maternal grandmother have hemochromatosis. Concerned about this.   Objective: Vital signs in last 24 hours: Temp:  [98.3 F (36.8 C)-98.4 F (36.9 C)] 98.3 F (36.8 C) (05/03 2112) Pulse Rate:  [65-67] 67 (05/03 2112) Resp:  [18-20] 20 (05/03 2112) BP: (122-134)/(76-97) 122/76 mmHg (05/03 2112) SpO2:  [98 %-99 %] 98 % (05/03 2112) Last BM Date: 04/07/15 General:   Alert and oriented, pleasant Abdomen:  Bowel sounds present, soft, mild TTP epigastric region, moderate TTP right-sided abdomen, non-distended. No HSM or hernias noted. No rebound or guarding. No masses appreciated  Extremities:  Without edema. Neurologic:  Alert and  oriented x4;  grossly normal neurologically. Psych:  Alert and cooperative. Normal mood and affect.  Intake/Output from previous day: 05/03 0701 - 05/04 0700 In: 2542.4 [P.O.:480; I.V.:1945.4; IV Piggyback:117] Out: 1550 [Urine:1550] Intake/Output this shift:    Lab Results:  Recent Labs  04/06/15 1019 04/07/15 0651 04/08/15 0744  WBC 4.1 4.0 5.3  HGB 8.8* 9.2* 7.8*  HCT 28.7* 30.6* 25.1*  PLT 267 278 242   BMET  Recent Labs  04/06/15 1019 04/07/15 0651 04/08/15 0744  NA 139 141 140  K 3.6 3.8 3.5  CL 110 109 110  CO2 23 25 25   GLUCOSE 94 124* 101*  BUN 11 8 5*  CREATININE 0.58 0.48 0.54  CALCIUM 9.0 9.2 8.3*   LFT  Recent Labs  04/06/15 1019 04/07/15 0651 04/08/15 0744  PROT 7.2 7.3 5.6*  ALBUMIN 4.2 4.2 3.3*  AST 22 170* 44*  ALT 17 163* 80*  ALKPHOS 89 138* 98  BILITOT 0.5 0.5 0.4   Hepatitis Panel  Recent Labs  04/07/15 1612  HEPBSAG NEGATIVE  HCVAB NEGATIVE   Lab Results  Component Value Date   IRON 14* 12/24/2013   TIBC 467 12/24/2013    FERRITIN 3* 12/24/2013    Studies/Results: Ct Abdomen Pelvis W Contrast  04/07/2015   CLINICAL DATA:  One day history of nausea and vomiting with 1 week history of epigastric pain and diarrhea.  EXAM: CT ABDOMEN AND PELVIS WITH CONTRAST  TECHNIQUE: Multidetector CT imaging of the abdomen and pelvis was performed using the standard protocol following bolus administration of intravenous contrast. Oral contrast was also administered.  CONTRAST:  59mL OMNIPAQUE IOHEXOL 300 MG/ML SOLN, 127mL OMNIPAQUE IOHEXOL 300 MG/ML SOLN  COMPARISON:  March 19, 2015  FINDINGS: There is atelectatic change in the left lung base. Lung bases otherwise clear.  Patient is status post gastric bypass procedure without complicating features. There is no wall thickening or perigastric fluid in the left upper quadrant region.  No focal liver lesions are identified. The gallbladder is absent. The distal common bile duct measures 11 mm, stable. There is no mass or calculus appreciable in the biliary ductal system.  Spleen, pancreas, and adrenals appear normal.  Kidneys bilaterally show no mass or hydronephrosis on either side. There is no renal or ureteral calculus on either side.  In the pelvis, the urinary bladder is midline with normal wall thickness. There is no pelvic mass or pelvic fluid collection. The appendix appears normal. Note that the recent right ovarian cyst is no longer appreciable.  There is no bowel obstruction. No free air or portal venous air. There is no ascites, adenopathy, or abscess  in the abdomen or pelvis. There is no demonstrable abdominal aortic aneurysm. There are no blastic or lytic bone lesions.  IMPRESSION: Patient is status post gastric bypass procedure without complicating feature appreciable. Currently there is no bowel obstruction. No abscess. Appendix region appears normal.  Mild common bile duct prominence is stable and is probably secondary to post cholecystectomy state. No biliary duct mass or  calculus seen.  Previously noted right ovarian cyst has resolved.  No renal or ureteral calculus.  No hydronephrosis.   Electronically Signed   By: Lowella Grip III M.D.   On: 04/07/2015 09:05   Dg Esophagus  04/08/2015   CLINICAL DATA:  Dysphagia.  Epigastric pain.  EXAM: ESOPHOGRAM / BARIUM SWALLOW / BARIUM TABLET STUDY  TECHNIQUE: Combined double contrast and single contrast examination performed using effervescent crystals, thick barium liquid, and thin barium liquid. The patient was observed with fluoroscopy swallowing a 13 mm barium sulphate tablet.  FLUOROSCOPY TIME:  Radiation Exposure Index (as provided by the fluoroscopic device):  If the device does not provide the exposure index:  Fluoroscopy Time:  2 minutes 48 seconds  Number of Acquired Images:  0  COMPARISON:  CT 04/07/2015  FINDINGS: Fluoroscopic evaluation of swallowing demonstrates normal esophageal peristalsis. No stricture, fold thickening or mass. No reflux with the water siphon maneuver. The patient swallowed a 13 mm barium tablet which briefly sticks in the distal esophagus despite no visible stricture. This reproduces the patient's symptoms.  IMPRESSION: Brief sticking of the 13 mm barium tablet in the distal esophagus, reproducing the patient's symptoms. No visible abnormality in this area.   Electronically Signed   By: Rolm Baptise M.D.   On: 04/08/2015 10:57   US Abdomen Limited Ruq  04/07/2015   CLINICAL DATA:  One month of abdominal pain with increased transaminases, history of cholecystectomy, irritable bowel disease, gastric bypass.  EXAM: US ABDOMEN LIMITED - RIGHT UPPER QUADRANT  COMPARISON:  Abdominal ultrasound of September 13, 2014  FINDINGS: The study is limited by the patient's body habitus, bowel gas, and inability to hold her breath.  Gallbladder:  The gallbladder is surgically absent.  Common bile duct:  Diameter: 5.6 mm  Liver:  The hepatic echotexture is mildly increased. There is no focal mass or intrahepatic ductal  dilation. Where visualized the surface contour is normal.  IMPRESSION: 1. Fatty infiltrative change of the liver. Within the limits of the study no hepatic parenchymal abnormality is demonstrated otherwise. 2. The gallbladder is surgically absent.   Electronically Signed   By: David  Martinique M.D.   On: 04/07/2015 16:45    Assessment: 29 year old female with acute on chronic abdominal pain, nausea, vomiting, s/p gastric bypass in the past, with associated hematemesis likely secondary to gastritis, possible ulcerative process, esophagitis, MW tear. N/V resolved since admission and tolerating full liquids. BPE yesterday with pill briefly hung in distal esophagus but no obvious stricture. Images reviewed by Dr. Oneida Alar and [facility] and felt to be related to only small sips of water. No endoscopy felt to be indicated yesterday. Discussed at length with patient: will attempt full liquids this morning and advance as tolerated. IF she is unable to advance diet, would recommend EGD +/- dilation while inpatient. With history of gastric bypass, increased risk for anastomotic ulcers, which could also contribute to abdominal pain and current symptomatology. AS OF NOTE: no internal hernia noted on CT scan. Reviewed personally with Dr. Ardeen Garland. Patient with multiple stressors, which are likely compounding symptoms as well. Not taking multivitamins/B12  as recommended s/p bariatric surgery. Will check routine nutritional labs.   Elevated LFTs: transient bumps noted with normalization documented. LFTs trending down. REPEAT LABS PENDING NOW. Known fatty liver and absent gallbladder. Prior work-up several years ago unrevealing and subsequent referral to [facility] recommended. Has not been evaluated at [facility]; we will arrange outpatient referral.   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 Drifts in Hgb likely multifactorial. Hematemesis reported prior to admission but no signs of overt GI bleeding currently. Ferritin 3  in Jan 2015. Received Feraheme yesterday. Outpatient ferrous sulfate 325 mg BID. Will likely need referral to hematology for iron infusions as outpatient.    Diarrhea: Cdiff PCR negative. History of alternating constipation/diarrhea. Likely IBS. No evidence of colitis on CT. No further diarrhea.   Plan: 1. Trial of full liquids. If unable to tolerate or advance diet, would recommend EGD prior to discharge. Would need Propofol.  2. PPI BID 3. Follow-up on pending CBC/CMP 4. Outpatient appointment at [facility] due to elevated LFTs 5. Continue probiotic 6. Check Vit D, B12, thiamine due to history of gastric bypass 7. Phenergan prn nausea 8. Outpatient referral to hematology for IV iron infusions   Orvil Feil, ANP-BC [facility]     LOS: 2 days    04/09/2015, 8:04 AM  Attending note:  Patient reports worsening of upper abdominal pain with food intake this afternoon.  I feel the best course of action would be to go ahead and offer a diagnostic EGD tomorrow. I discussed this approach with her The risks, benefits, limitations, alternatives and imponderables have been reviewed with the patient. Potential for esophageal dilation, biopsy, etc. have also been reviewed.  Questions have been answered. All parties agreeable.

## 2015-04-10 NOTE — Transfer of Care (Signed)
Immediate Anesthesia Transfer of Care Note  [name]  Procedure(s) Performed: Procedure(s): ESOPHAGOGASTRODUODENOSCOPY (EGD) WITH PROPOFOL (N/A) ESOPHAGEAL DILATION WITH 54FR MALONEY DILATOR (N/A) BIOPSY  Patient Location: PACU  Anesthesia Type:MAC  Level of Consciousness: awake, alert  and oriented  Airway & Oxygen Therapy: Patient Spontanous Breathing and Patient connected to nasal cannula oxygen  Post-op Assessment: Report given to RN  Post vital signs: Reviewed and stable  Last Vitals:  Filed Vitals:   04/10/15 1035  BP: 115/75  Pulse:   Temp:   Resp: 21    Complications: No apparent anesthesia complications

## 2015-04-11 ENCOUNTER — Encounter (HOSPITAL_COMMUNITY): Payer: Self-pay | Admitting: Internal Medicine

## 2015-04-12 ENCOUNTER — Encounter: Payer: Self-pay | Admitting: Internal Medicine

## 2015-04-14 NOTE — Progress Notes (Signed)
HAS APPOINTMENT WITH [name] June 2016

## 2015-04-15 ENCOUNTER — Telehealth: Payer: Self-pay

## 2015-04-15 ENCOUNTER — Other Ambulatory Visit: Payer: Self-pay

## 2015-04-15 ENCOUNTER — Encounter (HOSPITAL_COMMUNITY): Payer: Self-pay | Admitting: Emergency Medicine

## 2015-04-15 ENCOUNTER — Emergency Department (HOSPITAL_COMMUNITY)
Admission: EM | Admit: 2015-04-15 | Discharge: 2015-04-15 | Disposition: A | Discharge status: Home / Self Care | Payer: 59 | Attending: Emergency Medicine | Admitting: Emergency Medicine

## 2015-04-15 DIAGNOSIS — Z8739 Personal history of other diseases of the musculoskeletal system and connective tissue: Secondary | ICD-10-CM | POA: Insufficient documentation

## 2015-04-15 DIAGNOSIS — E669 Obesity, unspecified: Secondary | ICD-10-CM | POA: Insufficient documentation

## 2015-04-15 DIAGNOSIS — R945 Abnormal results of liver function studies: Principal | ICD-10-CM

## 2015-04-15 DIAGNOSIS — Z9889 Other specified postprocedural states: Secondary | ICD-10-CM | POA: Insufficient documentation

## 2015-04-15 DIAGNOSIS — Z91040 Latex allergy status: Secondary | ICD-10-CM | POA: Diagnosis not present

## 2015-04-15 DIAGNOSIS — Z87448 Personal history of other diseases of urinary system: Secondary | ICD-10-CM | POA: Diagnosis not present

## 2015-04-15 DIAGNOSIS — G8929 Other chronic pain: Secondary | ICD-10-CM | POA: Diagnosis not present

## 2015-04-15 DIAGNOSIS — G43909 Migraine, unspecified, not intractable, without status migrainosus: Secondary | ICD-10-CM | POA: Insufficient documentation

## 2015-04-15 DIAGNOSIS — R112 Nausea with vomiting, unspecified: Secondary | ICD-10-CM | POA: Diagnosis not present

## 2015-04-15 DIAGNOSIS — D509 Iron deficiency anemia, unspecified: Secondary | ICD-10-CM | POA: Diagnosis not present

## 2015-04-15 DIAGNOSIS — R197 Diarrhea, unspecified: Secondary | ICD-10-CM | POA: Insufficient documentation

## 2015-04-15 DIAGNOSIS — G40909 Epilepsy, unspecified, not intractable, without status epilepticus: Secondary | ICD-10-CM | POA: Insufficient documentation

## 2015-04-15 DIAGNOSIS — F329 Major depressive disorder, single episode, unspecified: Secondary | ICD-10-CM | POA: Diagnosis not present

## 2015-04-15 DIAGNOSIS — F419 Anxiety disorder, unspecified: Secondary | ICD-10-CM | POA: Insufficient documentation

## 2015-04-15 DIAGNOSIS — Z79899 Other long term (current) drug therapy: Secondary | ICD-10-CM | POA: Insufficient documentation

## 2015-04-15 DIAGNOSIS — Z9884 Bariatric surgery status: Secondary | ICD-10-CM | POA: Diagnosis not present

## 2015-04-15 DIAGNOSIS — R7989 Other specified abnormal findings of blood chemistry: Secondary | ICD-10-CM

## 2015-04-15 DIAGNOSIS — Z720 Tobacco use: Secondary | ICD-10-CM | POA: Diagnosis not present

## 2015-04-15 LAB — COMPREHENSIVE METABOLIC PANEL
ALBUMIN: 4.1 g/dL (ref 3.5–5.0)
ALT: 28 U/L (ref 14–54)
AST: 25 U/L (ref 15–41)
Alkaline Phosphatase: 92 U/L (ref 38–126)
Anion gap: 7 (ref 5–15)
BUN: 14 mg/dL (ref 6–20)
CHLORIDE: 109 mmol/L (ref 101–111)
CO2: 23 mmol/L (ref 22–32)
CREATININE: 0.66 mg/dL (ref 0.44–1.00)
Calcium: 8.9 mg/dL (ref 8.9–10.3)
GFR calc Af Amer: >60 mL/min (ref >60)
GFR calc non Af Amer: >60 mL/min (ref >60)
Glucose, Bld: 91 mg/dL (ref 70–99)
Potassium: 3.5 mmol/L (ref 3.5–5.1)
Sodium: 139 mmol/L (ref 135–145)
Total Bilirubin: 0.3 mg/dL (ref 0.3–1.2)
Total Protein: 6.7 g/dL (ref 6.5–8.1)

## 2015-04-15 LAB — URINALYSIS, ROUTINE W REFLEX MICROSCOPIC
BILIRUBIN URINE: NEGATIVE
Glucose, UA: NEGATIVE mg/dL
Ketones, ur: NEGATIVE mg/dL
Leukocytes, UA: NEGATIVE
Nitrite: NEGATIVE
PH: 6 (ref 5.0–8.0)
Protein, ur: NEGATIVE mg/dL
Specific Gravity, Urine: <1.005 — ABNORMAL LOW (ref 1.005–1.030)
Urobilinogen, UA: 0.2 mg/dL (ref 0.0–1.0)

## 2015-04-15 LAB — LIPASE, BLOOD: Lipase: 30 U/L (ref 22–51)

## 2015-04-15 LAB — URINE MICROSCOPIC-ADD ON

## 2015-04-15 LAB — I-STAT CG4 LACTIC ACID, ED: Lactic Acid, Venous: 0.91 mmol/L (ref 0.5–2.0)

## 2015-04-15 MED ORDER — HYDROCODONE-ACETAMINOPHEN 5-325 MG PO TABS: 2.0000 | ORAL_TABLET | ORAL | Status: DC | PRN

## 2015-04-15 MED ORDER — SODIUM CHLORIDE 0.9 % IV BOLUS (SEPSIS)
1000.0000 mL | Freq: Once | INTRAVENOUS | Status: AC
  Administered 2015-04-15: 1000 mL via INTRAVENOUS

## 2015-04-15 MED ORDER — PROMETHAZINE HCL 25 MG PO TABS: 25.0000 mg | ORAL_TABLET | Freq: Four times a day (QID) | ORAL | Status: DC | PRN

## 2015-04-15 MED ORDER — FENTANYL CITRATE (PF) 100 MCG/2ML IJ SOLN
100.0000 ug | Freq: Once | INTRAMUSCULAR | Status: AC
  Administered 2015-04-15: 100 ug via INTRAVENOUS
  Filled 2015-04-15: qty 2

## 2015-04-15 MED ORDER — PROMETHAZINE HCL 25 MG/ML IJ SOLN
25.0000 mg | Freq: Once | INTRAMUSCULAR | Status: AC
  Administered 2015-04-15: 25 mg via INTRAVENOUS
  Filled 2015-04-15: qty 1

## 2015-04-15 NOTE — ED Provider Notes (Signed)
CSN: [id number]     Arrival date & time 04/15/15  1001 History  This chart was scribed for Noemi Chapel, MD by Eustaquio Maize, ED Scribe. This patient was seen in room APA07/APA07 and the patient's care was started at 11:08 AM.    Chief Complaint  Patient presents with  . Hematemesis    The history is provided by the patient. No language interpreter was used.     HPI Comments: Penny [name] is a 29 y.o. female with hx seizures who presents to the Emergency Department complaining of hematemesis that began today. Pt also complains of abdominal pain, nausea, vomiting, and watery diarrhea. She reports syncopal episode while at work earlier today. Pt is unsure if it was a seizure. She reports coming to on the floor and having a left sided headache. Pt mentions that she had hematemesis approximately 8 days ago and was admitted to the hospital. Pt had endoscopy which showed anastomotic ulcer which was non bleeding. They biopsied the ulcer. She also had esophageal swallow, CT A/P, and RUQ US done with no acute findings. Denies recent sick contact with similar symptoms. Hx gastric bypass surgery and cholecystectomy.    Past Medical History  Diagnosis Date  . Migraines   . Interstitial cystitis   . IUD FEB 2010  . Gastritis JULY 2011  . Depression   . 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 Fatty liver   . Obesity (BMI 30-39.9) 2011 228 LBS BMI 36.8  . Iron deficiency anemia 07/23/2010  . Irritable bowel syndrome 2012 DIARRHEA    JUN 2012 TTG IgA 14.9  . Elevated liver enzymes JUL 2011ALK PHOS 111-127 AST  143-267 ALT  213-321T BILI 0.6  ALB  3.7-4.06 Jun 2011 ALK PHOS 118 AST 24 ALT 42 T BILI 0.4 ALB 3.9  . Chronic daily headache   . Ovarian cyst   . ADD (attention deficit disorder)   . RLQ abdominal pain 07/18/2013  . Menorrhagia 07/18/2013  . Anxiety   . Potassium (K) deficiency   . Dysmenorrhea 09/18/2013  . Stress 09/18/2013  . Patient desires pregnancy 09/18/2013  . Pregnant  12/25/2013  . Blood transfusion without reported diagnosis   . Heart murmur   . Gastric bypass status for obesity   . 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 Dysrhythmia   . Sciatica of left side 11/14/2014  . Fibromyalgia   . 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 PONV (postoperative nausea and vomiting) seizure post-operatively   Past Surgical History  Procedure Laterality Date  . Gab  2007    in [place]-POUCH 5 CM  . Cholecystectomy  2005    biliary dyskinesia  . Cath self every nite      for sodium bicarb injection (discontinued 2013)  . Colonoscopy  JUN 2012 ABD PN/DIARRHEA WITH PROPOFOL    NL COLON  . Upper gastrointestinal endoscopy  JULY 2011 NAUSEA-D125,V6, PH 25    Bx; GASTRITIS, POUCH-5 CM LONG  . Dilation and curettage of uterus    . Tonsillectomy    . Gastric bypass  06/2006  . Savory dilation  06/20/2012    Dr. Barnie Alderman gastritis/Ulcer in the mid jejunum. Empiric dilation.   . Tonsillectomy and adenoidectomy    . Esophagogastroduodenoscopy    . Wisdom tooth extraction    . Repair vaginal cuff N/A 07/30/2014    Procedure: REPAIR VAGINAL CUFF;  Surgeon: Mora Bellman, MD;  Location: [facility];  Service: Gynecology;  Laterality: N/A;  . Hysteroscopy w/d&c N/A 09/12/2014    Procedure: DILATATION AND CURETTAGE /HYSTEROSCOPY;  Surgeon: Jonnie Kind, MD;  Location: [facility];  Service: Gynecology;  Laterality: N/A;  . Esophagogastroduodenoscopy (egd) with propofol N/A 04/10/2015    Procedure: ESOPHAGOGASTRODUODENOSCOPY (EGD) WITH PROPOFOL;  Surgeon: Daneil Dolin, MD;  Location: [facility];  Service: Endoscopy;  Laterality: N/A;  . Esophageal dilation N/A 04/10/2015    Procedure: ESOPHAGEAL DILATION WITH 54FR MALONEY DILATOR;  Surgeon: Daneil Dolin, MD;  Location: [facility];  Service: Endoscopy;  Laterality: N/A;  . Esophageal biopsy  04/10/2015    Procedure: BIOPSY;  Surgeon: Daneil Dolin, MD;  Location: [facility];  Service: Endoscopy;;   Family History   Problem Relation Age of Onset  . Hemochromatosis Maternal Grandmother   . Migraines Maternal Grandmother   . Cancer Maternal Grandmother   . Breast cancer Maternal Grandmother   . Hypertension Father   . Diabetes Father   . Coronary artery disease Father   . Migraines Paternal Grandmother   . Breast cancer Paternal Grandmother   . Cancer Mother     breast  . Hemochromatosis Mother   . Breast cancer Mother   . Coronary artery disease Paternal Grandfather    History  Substance Use Topics  . Smoking status: Current Every Day Smoker -- 0.50 packs/day for 6 years    Types: Cigarettes  . Smokeless tobacco: Never Used  . Alcohol Use: No   OB History    Gravida Para Term Preterm AB TAB SAB Ectopic Multiple Living   '3 2 1 1 1  1   2     '$ Review of Systems  All other systems reviewed and are negative.     Allergies  Gabapentin; Metoclopramide hcl; Codeine; Zofran; Latex; and Tape  Home Medications   Prior to Admission medications   Medication Sig Start Date End Date Taking? Authorizing Provider  clonazePAM (KLONOPIN) 0.5 MG tablet Take 1 tablet (0.5 mg total) by mouth 3 (three) times daily as needed for anxiety. 04/10/15  Yes Lezlie Octave Black, NP  cyclobenzaprine (FLEXERIL) 5 MG tablet TAKE 1 TABLET BY MOUTH EVERY EIGHT HOURS AS NEEDED FOR MUSCLE SPASMS. 03/17/15  Yes Kathrynn Ducking, MD  DULoxetine (CYMBALTA) 30 MG capsule Take 1-2 capsules (30-60 mg total) by mouth 2 (two) times daily. Take 1 capsule (30 mg) every morning and 2 capsules (60 mg) every night 02/24/15  Yes Albertine Patricia, MD  ferrous sulfate 325 (65 FE) MG tablet Take 1 tablet (325 mg total) by mouth 2 (two) times daily with a meal. 02/06/15  Yes Okey Regal, PA-C  pantoprazole (PROTONIX) 40 MG tablet Take 1 tablet (40 mg total) by mouth 2 (two) times daily before a meal. 04/10/15  Yes Lezlie Octave Black, NP  sucralfate (CARAFATE) 1 GM/10ML suspension Take 10 mLs (1 g total) by mouth 4 (four) times daily -  with meals and  at bedtime. 04/10/15  Yes Lezlie Octave Black, NP  topiramate (TOPAMAX) 100 MG tablet Take 1 tablet (100 mg total) by mouth at bedtime. 02/18/15  Yes Kathrynn Ducking, MD  zolpidem (AMBIEN) 5 MG tablet Take 1 tablet (5 mg total) by mouth at bedtime as needed for sleep. 12/26/14  Yes Kathyrn Drown, MD  HYDROcodone-acetaminophen (NORCO/VICODIN) 5-325 MG per tablet Take 2 tablets by mouth every 4 (four) hours as needed. 04/15/15   Noemi Chapel, MD  oxyCODONE-acetaminophen (PERCOCET) 5-325 MG per tablet Take 1 tablet  by mouth every 4 (four) hours as needed for severe pain. Patient not taking: Reported on 04/15/2015 04/06/15   Daleen Bo, MD  promethazine (PHENERGAN) 25 MG tablet Take 1 tablet (25 mg total) by mouth every 6 (six) hours as needed for nausea or vomiting. 04/15/15   Noemi Chapel, MD   Triage Vitals: BP 115/73 mmHg  Pulse 77  Temp(Src) 97.5 F (36.4 C) (Oral)  Resp 18  Ht $R'5\' 6"'Wm$  (1.676 m)  Wt 239 lb (108.41 kg)  BMI 38.59 kg/m2  SpO2 100%  LMP 04/14/2015   Physical Exam  Constitutional: She appears well-developed and well-nourished. No distress.  HENT:  Head: Normocephalic and atraumatic.  Mouth/Throat: Oropharynx is clear and moist. No oropharyngeal exudate.  Oropharynx clear with no signs of blood.   Eyes: Conjunctivae and EOM are normal. Pupils are equal, round, and reactive to light. Right eye exhibits no discharge. Left eye exhibits no discharge. No scleral icterus.  Neck: Normal range of motion. Neck supple. No JVD present. No thyromegaly present.  Cardiovascular: Normal rate, regular rhythm, normal heart sounds and intact distal pulses.  Exam reveals no gallop and no friction rub.   No murmur heard. Pulmonary/Chest: Effort normal and breath sounds normal. No respiratory distress. She has no wheezes. She has no rales.  Abdominal: Soft. Bowel sounds are normal. She exhibits no distension and no mass. There is tenderness.  Tenderness RUQ and right mid abdomen.  No other guarding or  tenderness.  No peritoneal signs.   Musculoskeletal: Normal range of motion. She exhibits no edema or tenderness.  Lymphadenopathy:    She has no cervical adenopathy.  Neurological: She is alert. Coordination normal.  Skin: Skin is warm and dry. No rash noted. No erythema.  Psychiatric: She has a normal mood and affect. Her behavior is normal.  Nursing note and vitals reviewed.   ED Course  Procedures (including critical care time)  DIAGNOSTIC STUDIES: Oxygen Saturation is 100% on RA, normal by my interpretation.    COORDINATION OF CARE: 11:14 AM-Discussed treatment plan which includes Lipase, Lactic acid, CBC, CMP, UA with pt at bedside and pt agreed to plan.   Labs Review Labs Reviewed  URINALYSIS, ROUTINE W REFLEX MICROSCOPIC - Abnormal; Notable for the following:    Specific Gravity, Urine <1.005 (*)    Hgb urine dipstick LARGE (*)    All other components within normal limits  CBC WITH DIFFERENTIAL/PLATELET - Abnormal; Notable for the following:    RBC 3.79 (*)    Hemoglobin 9.2 (*)    HCT 29.8 (*)    MCH 24.3 (*)    RDW 20.1 (*)    All other components within normal limits  COMPREHENSIVE METABOLIC PANEL  LIPASE, BLOOD  URINE MICROSCOPIC-ADD ON  I-STAT CG4 LACTIC ACID, ED  I-STAT CG4 LACTIC ACID, ED    Imaging Review No results found.    MDM   Final diagnoses:  Nausea vomiting and diarrhea    Pt has minimall tender abd after meds - she is feeling much better, labs are unremarkable - she has no fever, no tachycardia and is amenable to d/c.  Meds given in ED:  Medications  sodium chloride 0.9 % bolus 1,000 mL (0 mLs Intravenous Stopped 04/15/15 1226)  fentaNYL (SUBLIMAZE) injection 100 mcg (100 mcg Intravenous Given 04/15/15 1127)  promethazine (PHENERGAN) injection 25 mg (25 mg Intravenous Given 04/15/15 1127)    New Prescriptions   HYDROCODONE-ACETAMINOPHEN (NORCO/VICODIN) 5-325 MG PER TABLET    Take 2 tablets by mouth  every 4 (four) hours as needed.    PROMETHAZINE (PHENERGAN) 25 MG TABLET    Take 1 tablet (25 mg total) by mouth every 6 (six) hours as needed for nausea or vomiting.      I personally performed the services described in this documentation, which was scribed in my presence. The recorded information has been reviewed and is accurate.        Noemi Chapel, MD 04/15/15 1440

## 2015-04-15 NOTE — Discharge Instructions (Signed)
Please call your doctor for a followup appointment within 24-48 hours. When you talk to your doctor please let them know that you were seen in the emergency department and have them acquire all of your records so that they can discuss the findings with you and formulate a treatment plan to fully care for your new and ongoing problems. ° °

## 2015-04-15 NOTE — ED Notes (Signed)
Patient with c/o hematemesis today. 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 Patient states she just "woke up on the floor". Not witnessed. Does have h/o seizure activity. Alert/oriented.

## 2015-04-15 NOTE — Telephone Encounter (Signed)
I called and informed pt. Lab order faxed to [facility]. She can't do today, will try before she comes in for the OV tomorrow.  Routing to [facility] for the referral.

## 2015-04-15 NOTE — ED Notes (Signed)
MD at bedside. 

## 2015-04-15 NOTE — Telephone Encounter (Signed)
Patient with history of chronic abdominal pain. EGD with normal appearing esophagus, s/p empiric dilation. Anastomotic ulcer was present, but this was not causing any stricture.   Recommend obtaining LFTs now. She has a history of transient bumps in transaminases with normalization documented between episodes. CT abdomen without evidence of internal hernia.   1. Boost or ensure for meals until seen tomorrow.  2. LFTs now 3. Referral to [facility] due to history of elevated LFTs 4. May want to consider small bowel follow through. Can be discussed at appt tomorrow.

## 2015-04-15 NOTE — Telephone Encounter (Signed)
Pt came by the office complaining of epigastric pain, constant and she rates the pain at an 8 today. She was in the hospital last week and had EGD by Dr. Gala Romney ( Dr. Oneida Alar pt) I told her the results ( see Dr. Roseanne Kaufman letter). She said she is still unable to eat anything except liquids. She does get quite a bite of water down. Said she was on pain meds in the hospital but they sent her home with none. She works nights at [employer] and said she cannot miss work, she is on verge of losing her job. She said her husband walked off and left her 3 months ago and she has to take care of 2 children alone.  I have scheduled her an Urgent OV with Neil Crouch, PA for 04/16/2015 at 11:30 AM. I told her if her pain worsens or she cannot keep liquids down to go to the ED. I am routing this to Laban Emperor,. NP who seen her in the hospital for any recommendations. Routing to Neil Crouch, PA for FYI before the [facility] on 04/16/2015.

## 2015-04-16 ENCOUNTER — Ambulatory Visit: Payer: 59 | Admitting: Gastroenterology

## 2015-04-16 ENCOUNTER — Encounter: Payer: Self-pay | Admitting: Gastroenterology

## 2015-04-16 ENCOUNTER — Telehealth: Payer: Self-pay | Admitting: Gastroenterology

## 2015-04-16 NOTE — Telephone Encounter (Signed)
Why did she not keep her appt today?? We can try to get an earlier pain management appt if possible. Needs PCP care in this scenario as well. NEEDS TO KEEP APPTS HERE WITH [name].

## 2015-04-16 NOTE — Telephone Encounter (Signed)
Referral has been made and appointment is on 07/02/15 @ 1:15. Pt is aware of appointment and time.

## 2015-04-16 NOTE — Telephone Encounter (Signed)
PATIENT WAS A NO SHOW AND LETTER WAS SENT  °

## 2015-04-16 NOTE — Telephone Encounter (Signed)
Pt is wanting to know what she can do about the pain management since her appointment at [facility] is so far out. She can not keep going to the ER. Please advise

## 2015-04-17 ENCOUNTER — Telehealth: Payer: Self-pay

## 2015-04-17 ENCOUNTER — Other Ambulatory Visit: Payer: Self-pay

## 2015-04-17 ENCOUNTER — Ambulatory Visit: Payer: Medicaid Other | Admitting: Neurology

## 2015-04-17 DIAGNOSIS — R1031 Right lower quadrant pain: Secondary | ICD-10-CM

## 2015-04-17 MED ORDER — HYOSCYAMINE SULFATE 0.125 MG SL SUBL: 0.1250 mg | SUBLINGUAL_TABLET | SUBLINGUAL | Status: DC | PRN

## 2015-04-17 NOTE — Telephone Encounter (Signed)
Pt is aware of what AS  Insurance is pending for small bowel follow through

## 2015-04-17 NOTE — Telephone Encounter (Signed)
Pt is scheduled for 04/21/2015 @ 915 am. Pt is aware of appt and that she needs to fasting.   No pre-cert is needed per Nira Conn at [facility]  Ref. [id number]

## 2015-04-17 NOTE — Telephone Encounter (Signed)
Notes epigastric pain and radiating into the back. Right-sided pain. Only tolerating protein shake.   I have sent in Levsin to take prn.  Please arrange small bowel follow through Please see about getting into pain management sooner and [facility], and let me know what you find out.

## 2015-04-17 NOTE — Telephone Encounter (Signed)
No PA is need pre Heather S.(ref.#[id number])

## 2015-04-17 NOTE — Telephone Encounter (Signed)
Patient cancelled appointment at 2:19 on the day of the appointment.

## 2015-04-17 NOTE — Addendum Note (Signed)
Addended by: Orvil Feil on: 04/17/2015 11:37 AM   Modules accepted: Orders

## 2015-04-17 NOTE — Telephone Encounter (Signed)
Pt called and said what was she going to do about her pain until her appt at [facility] on 07/02/2015. I reminded her that she no showed for her appt on 04/16/2015.  She said no,  someone called and told her it was no need for her to come, that there was nothing we could do here for her. I told her that NO ONE from this office called and told her that, we had expected her to come in. I scheduled her for another appt on 04/29/2015 @ 2:00 PM with Walden Field, NP. Didn't think we should use urgent slot since she no showed. ( She had her original hospital follow up for 05/08/2015 with Neil Crouch, PA), not cancelled yet.  I asked if she wanted to be referred to pain management and she asked what will they do. I told her they will decide what type management she needs for her pain. I told her to see PCP as Laban Emperor, NP had recommended. She said they will not do anythinig for her.   She asked for Laban Emperor, NP to please give her a courtesy call since she saw her in the hospital. I told her I would let Vicente Males know her request.

## 2015-04-21 ENCOUNTER — Ambulatory Visit (HOSPITAL_COMMUNITY)
Admission: RE | Admit: 2015-04-21 | Discharge: 2015-04-21 | Disposition: A | Discharge status: Home / Self Care | Payer: 59 | Source: Ambulatory Visit | Attending: Gastroenterology | Admitting: Gastroenterology

## 2015-04-21 ENCOUNTER — Telehealth: Payer: Self-pay | Admitting: General Practice

## 2015-04-21 ENCOUNTER — Other Ambulatory Visit: Payer: Self-pay

## 2015-04-21 DIAGNOSIS — R112 Nausea with vomiting, unspecified: Secondary | ICD-10-CM | POA: Diagnosis not present

## 2015-04-21 DIAGNOSIS — R109 Unspecified abdominal pain: Secondary | ICD-10-CM

## 2015-04-21 DIAGNOSIS — R1031 Right lower quadrant pain: Secondary | ICD-10-CM | POA: Diagnosis not present

## 2015-04-21 NOTE — Telephone Encounter (Signed)
[facility] and pt is on recall list in case someone cancels so she can be seen sooner than her July appointment.   Pt has been referred to [facility]. [phone].

## 2015-04-21 NOTE — Telephone Encounter (Signed)
Routing to Candy  

## 2015-04-21 NOTE — Telephone Encounter (Signed)
I have already asked for the [facility] appt and Pain Management to be expedited as of last week. I reviewed the plan of care with her last week, and I told her we could not prescribe narcotics. She has a history of polypharmacy, and it is not appropriate to prescribe narcotics to her. I am sorry she is so frustrated. She has a lot of psychosocial stressors, which are compounding things. We will help where we can, but she can bring her kids with her to her appt. It is important that she keeps appointments or at least calls to let us know if she can't make it.

## 2015-04-21 NOTE — Telephone Encounter (Signed)
Patient called in stating she was very frustrated with this office.  Patient claims she is frustrated with this office she has a 24 month old that never sleeps and her husband walk out on her.    She knows she missed her appointment here because she didn't have child care.  She is not a "pill head"  and she would like some help with her abd pain.  I asked if she wanted [name] to have her referral to pain management expedited.  She wanted to know if she can get something for her pain while she waits for her pain management referral.   Candy, please see if we can expedite her referral to pain management.  She had her SBFT done today.  Routing to [place].

## 2015-04-22 ENCOUNTER — Telehealth: Payer: Self-pay | Admitting: Gastroenterology

## 2015-04-22 ENCOUNTER — Ambulatory Visit (INDEPENDENT_AMBULATORY_CARE_PROVIDER_SITE_OTHER): Payer: 59 | Admitting: Internal Medicine

## 2015-04-22 ENCOUNTER — Encounter (INDEPENDENT_AMBULATORY_CARE_PROVIDER_SITE_OTHER): Payer: Self-pay | Admitting: Internal Medicine

## 2015-04-22 ENCOUNTER — Encounter: Payer: Self-pay | Admitting: General Practice

## 2015-04-22 VITALS — BP 106/64 | HR 72 | Temp 98.3°F | Ht 66.0 in | Wt 234.7 lb

## 2015-04-22 DIAGNOSIS — G8929 Other chronic pain: Secondary | ICD-10-CM

## 2015-04-22 DIAGNOSIS — R1010 Upper abdominal pain, unspecified: Secondary | ICD-10-CM

## 2015-04-22 DIAGNOSIS — R1031 Right lower quadrant pain: Secondary | ICD-10-CM | POA: Diagnosis not present

## 2015-04-22 DIAGNOSIS — R1011 Right upper quadrant pain: Secondary | ICD-10-CM

## 2015-04-22 MED ORDER — LIDOCAINE 5 % EX PTCH
1.0000 | MEDICATED_PATCH | CUTANEOUS | Status: DC
Start: 2015-04-22 — End: 2015-05-15

## 2015-04-22 MED ORDER — LIDOCAINE 5 % EX PTCH: 2.0000 | MEDICATED_PATCH | CUTANEOUS | Status: DC

## 2015-04-22 NOTE — Telephone Encounter (Signed)
Discharge letter mailed and future appointments were cancelled

## 2015-04-22 NOTE — Telephone Encounter (Signed)
REVIEWED-NO ADDITIONAL RECOMMENDATIONS. 

## 2015-04-22 NOTE — Patient Instructions (Addendum)
Low fat diet x 6 meals a day. She will be referred to [facility]  Hepatic function today.

## 2015-04-22 NOTE — Progress Notes (Signed)
Quick Note:  No evidence of enteroenterostomy abnormalities. There is no abnormality on her SBFT. She has had multiple evaluations. CT without internal hernia. I checked her nutritional parameters while inpatient to include B12, B1, and VIt D and these were normal. BUT, Vit D was low level normal. Sometimes, Vit deficiencies can lead to chronic N/V. She does not have a B vitamin deficiency that would contribute to this. Vit D is low normal, and we can treat with Vit D 50,000 iu weekly for 8 weeks then recheck. This low normal value is not contributing to abdominal pain or N/V, although it could contribute to fatigue. 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 I feel she is drug-seeking. I think it would be in her best interest to see Pain Management AND to go to [facility] due to intermittent elevated LFTs (?sphincter of Oddi?), which we have recommended and is in the works. We have exhausted our resources here. Prior exhaustive liver evaluation has been undertaken to include evaluation for hemochromatosis. She was referred to [facility] due to the complexity of her case. SHE IS NOW SEEING DR. Olevia Perches OFFICE today, 04/22/15. She needs to decide which practice she would like to continue care with, because the going back and forth is not appropriate and could potentially lead to dismissal from this practice. The vitamin information would have been reviewed at her office visit, but she no-showed.    For now, if patient is willing to stay with [name], may do the following 1. Needs REPEAT EGD in 3 months.  2. Vit D 50,000 iu weekly for 8 weeks, then recheck Vit D level in 3 months. Let me know if she is continuing care with [name]. IF SO, I will send this in.  3. Find out where patient would like to continue care. ______

## 2015-04-22 NOTE — Telephone Encounter (Signed)
I spoke with Penny [name] at Dr. Jodene Nam office and she said she would have him look over the referral and get back to me today with a decision

## 2015-04-22 NOTE — Progress Notes (Signed)
Quick Note:  I agree. Reviewed ______

## 2015-04-22 NOTE — Telephone Encounter (Signed)
Patient claims she spoke with someone from the pain management office and they stated they can only see her if she has $190 up front.

## 2015-04-22 NOTE — Telephone Encounter (Signed)
REVIEWED TC FROM MAY 5 TO PRESENT.

## 2015-04-22 NOTE — Telephone Encounter (Signed)
Routing to Susan. 

## 2015-04-22 NOTE — Telephone Encounter (Signed)
Patient called this afternoon asking if her medical records had been transferred to Dr Olevia Perches office because she has an appointment there today at 230pm. She said she had signed a release of records yesterday. I told her that I have been off since Thursday and today was my first day back, but since Dr Laural Golden was on Epic with [facility] there nothing for me to send. Patient then got an attitude saying that if she had any problems with her medical records while at her appointment today that she would be back here at our office and there would be problems. I told her there shouldn't be any problems because we share the same electronic medical record with Dr Laural Golden.  Patient then snapped at me saying, "I work with [facility]. I know how Epic works". I told her, then she should know there shouldn't be a problem with her records. She continues yelling at me about how fed up she is with our office and how she had a long talk with Rosendo Gros Curator yesterday) and then she said, "as a matter of fact, let me speak to [facility] office manager" and I told her, "Gladly" and I put her on hold and transferred call to CM.

## 2015-04-22 NOTE — Telephone Encounter (Signed)
REVIEWED. PLEASE SEND PATIENT A LETTER TO DISCHARGE HER FROM THE PRACTICE. SHE SHOULD SEEK ANOTHER GI DOCTOR FOR CARE IN THE FUTURE.

## 2015-04-22 NOTE — Telephone Encounter (Signed)
I confirmed with Inez Catalina at Dr. Jodene Nam office and she confirmed the patient has a $190 upfront cost that she can't afford.

## 2015-04-22 NOTE — Progress Notes (Signed)
Subjective:    Patient ID: Penny [name], female    DOB: Jan 12, 1986, 29 y.o.   MRN: [id number]  HPI Patient is a self referral. She presents today with c/o rt abdominal pain (upper and lower ). She says she has not eaten in over a month. She says she has had this pain for 2 months. Rates the pain at an 8. No fever associated with her pain.  Eating makes the pain worse.  She says her liver enzymes are up and down.  She says the only thing she can keep down is liquids. She says she has had weight loss. Over the past 2 months she has lost 10-15 pounds. She says if she eats solids she will vomit.  Her BMs are normal. Usually has a BM daily.  She has an appt 07/02/2015 at [facility] to see a GI specialist.  She had a cholecystectomy 2005 by Dr. Leane Para for non-functioning gallbladder. 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  04/07/2015 AST 170, ALT 163. Eight months post partum. She had a D and C after child birth for retained products of conception.  CMP Latest Ref Rng 04/15/2015 04/10/2015 04/09/2015  Glucose 70 - 99 mg/dL 91 106(H) 103(H)  BUN 6 - 20 mg/dL 14 <5(L) <5(L)  Creatinine 0.44 - 1.00 mg/dL 0.66 0.69 0.52  Sodium 135 - 145 mmol/L 139 139 138  Potassium 3.5 - 5.1 mmol/L 3.5 4.1 3.3(L)  Chloride 101 - 111 mmol/L 109 106 106  CO2 22 - 32 mmol/L 23 26 26   Calcium 8.9 - 10.3 mg/dL 8.9 9.2 8.9  Total Protein 6.5 - 8.1 g/dL 6.7 - 6.2(L)  Total Bilirubin 0.3 - 1.2 mg/dL 0.3 - 0.6  Alkaline Phos 38 - 126 U/L 92 - 102  AST 15 - 41 U/L 25 - 29  ALT 14 - 54 U/L 28 - 64(H)      04/21/2015 DG Small Bowel:  FINDINGS: Scout film demonstrates normal bowel gas pattern. Moderate stool in the colon.   Changes of gastric bypass. Rapid small-bowel transit time to the colon of approximately 5 minutes. Spot compression images demonstrate no focal small bowel abnormality. Terminal ileum unremarkable.   IMPRESSION: Rapid small bowel transit time of approximately 5 minutes. No  focal small bowel abnormality. 04/07/2015 CT abdomen/pelvis with CM: rt abdominal pain: IMPRESSION: CBD: 45mm Patient is status post gastric bypass procedure without complicating feature appreciable. Currently there is no bowel obstruction. No abscess. Appendix region appears normal.   Mild common bile duct prominence is stable and is probably secondary to post cholecystectomy state. No biliary duct mass or calculus seen.   Previously noted right ovarian cyst has resolved.   No renal or ureteral calculus.  No hydronephrosis. seen.   Previously noted right ovarian cyst has resolved.   No renal or ureteral calculus.  No hydronephrosis    04/07/2015 Hep. C antibody negative. Hep B surface antigen negative, HIV non reactive. 04/10/2015 EGD/ED: dysphagia, abdominal pain: Dr. Gala Romney Normal appearing esophagus-status post [facility] dilator. Surgical altered stomach. Anastomotic ulcer present-status post biopsyBiopsy: Benign small bowel-type mucosa with focal erosion. Negative for H. pylori. No dysplasia or malignancy.  07/02/2010 EGD with cold forceps biopsy of gastric mucosa/anastomosis. Dr. Oneida Alar: IMPRESSION: Patient is status post gastric bypass procedure without complicating feature appreciable. Currently there is no bowel obstruction. No abscess. Appendix region appears normal.   Mild common bile duct prominence is stable and is probably secondary to post cholecystectomy state. No biliary  duct mass or calculus  STOMACH, BIOPSY, : - REACTIVE CHANGES WITH INFLAMMATION AND FOCAL EROSION.  - no helicobacter pylori, dysplasia or malignancy identified.       Review of Systems Separated. Two children in good health.     Objective:   Physical ExamBlood pressure 106/64, pulse 72, temperature 98.3 F (36.8 C), height 5\' 6"  (1.676 m), weight 234 lb 11.2 oz (106.459 kg), last menstrual period 04/14/2015, not currently breastfeeding.  Alert and oriented. Skin warm and dry. Oral mucosa is  moist.   . Sclera anicteric, conjunctivae is pink. Thyroid not enlarged. No cervical lymphadenopathy. Lungs clear. Heart regular rate and rhythm.  Abdomen is soft. Bowel sounds are positive. No hepatomegaly. No abdominal masses felt. Rt upper quadant pain and rt lower quadrant pain.  Dr. Laural Golden in with patient and examine patient.      Assessment & Plan:  Rt upper quadrant pain and rt lower quadrant pain. Hx of elevated liver enzymes. ? Problems with CBD stone. May need an ERCP which will be done at [facility]. Patient has referral to GI at [facility] in July. She will let me know what MD. Hepatic function today.  If pain increases: will repeat a hepatic function.

## 2015-04-23 LAB — HEPATIC FUNCTION PANEL
ALBUMIN: 4.4 g/dL (ref 3.5–5.2)
ALT: 20 U/L (ref 0–35)
AST: 22 U/L (ref 0–37)
Alkaline Phosphatase: 80 U/L (ref 39–117)
Bilirubin, Direct: 0.2 mg/dL (ref 0.0–0.3)
Indirect Bilirubin: 0.3 mg/dL (ref 0.2–1.2)
TOTAL PROTEIN: 7 g/dL (ref 6.0–8.3)
Total Bilirubin: 0.5 mg/dL (ref 0.2–1.2)

## 2015-04-23 IMAGING — CR DG ABDOMEN ACUTE W/ 1V CHEST
4 series · 4 of 4 positions shown · non-contrast
Comparison: CT abdomen pelvis - 11/13/2012; chest radiograph -
12/17/2010

CLINICAL DATA: Right-sided abdominal low back pain which radiates
to right lower quadrant, nausea and vomiting

ACUTE ABDOMEN SERIES (ABDOMEN 2 VIEW & CHEST 1 VIEW)

[w chest pa]
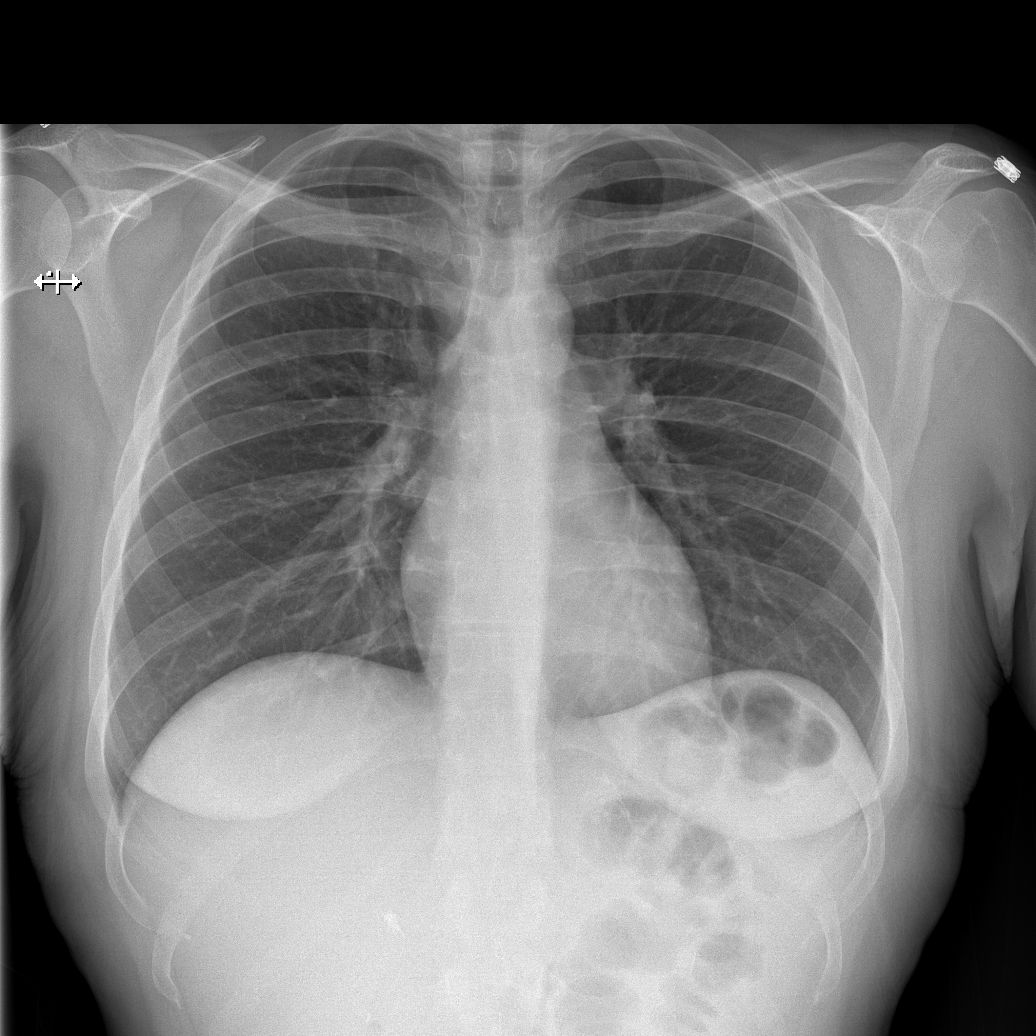

[w abdomen upright]
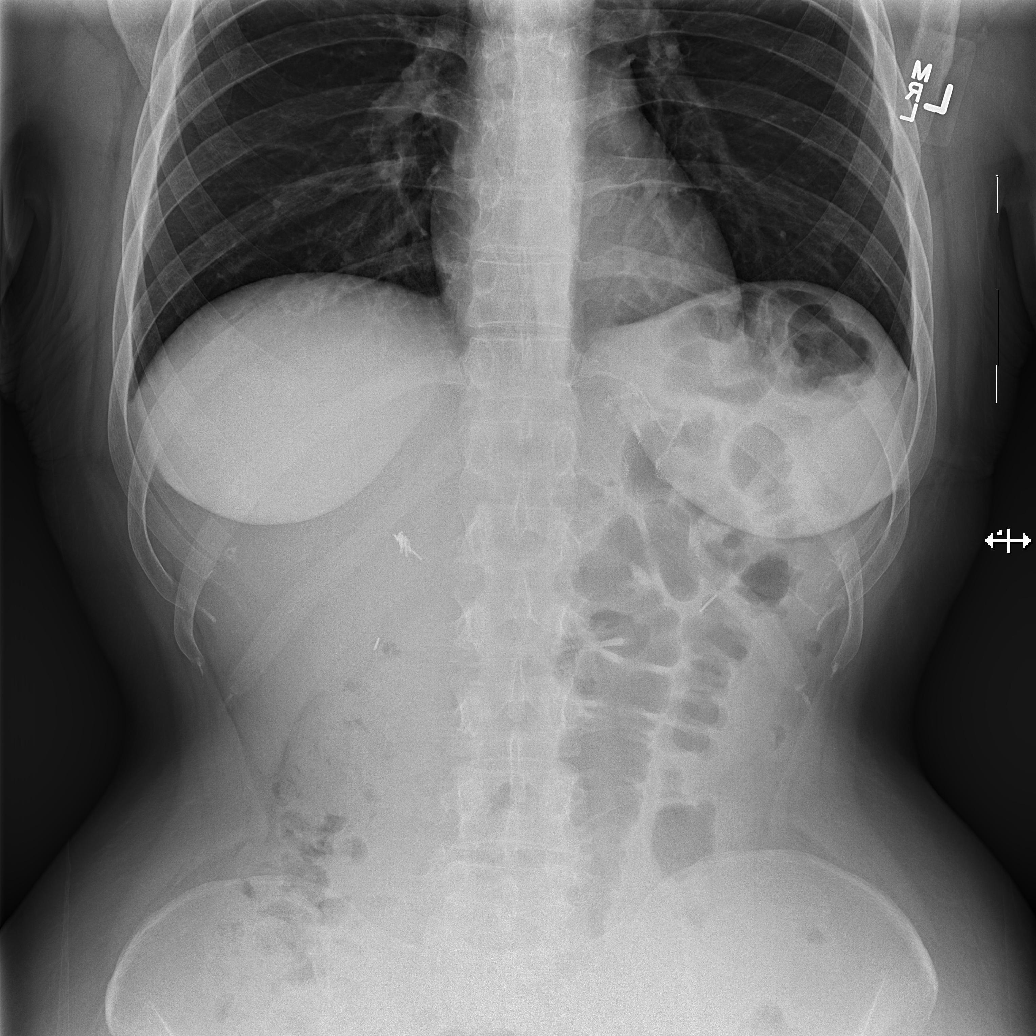

[t abdomen supine (1 of 2)]
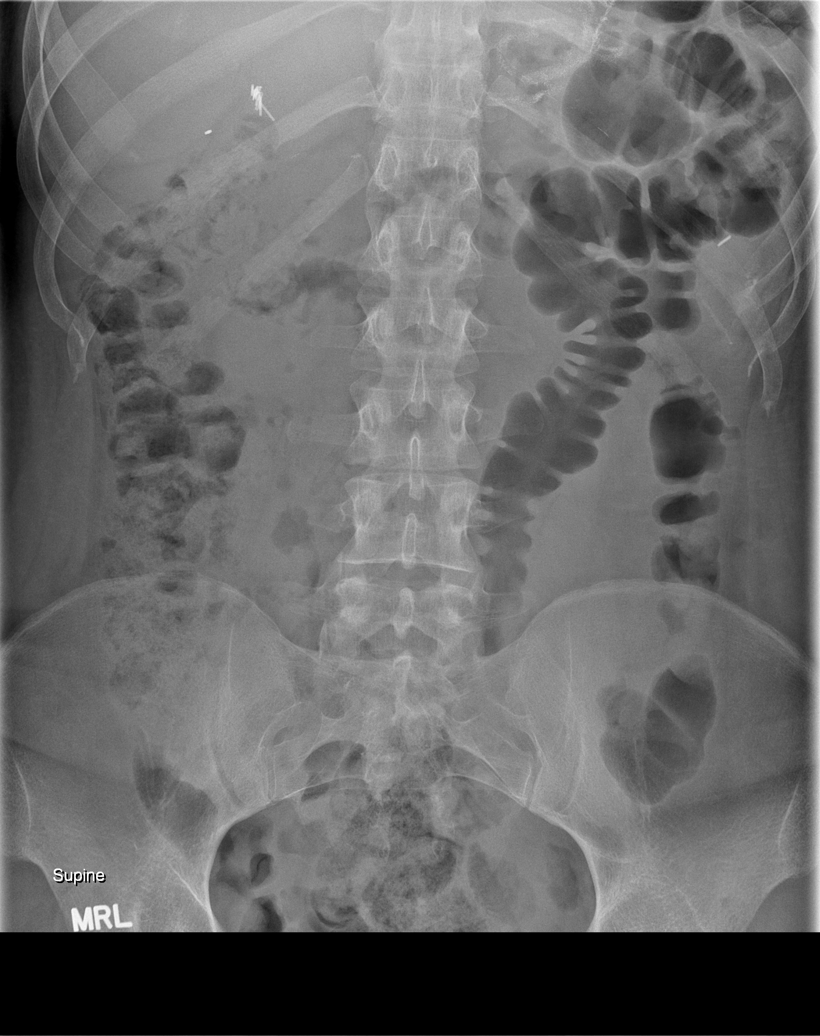

[t abdomen supine (2 of 2)]
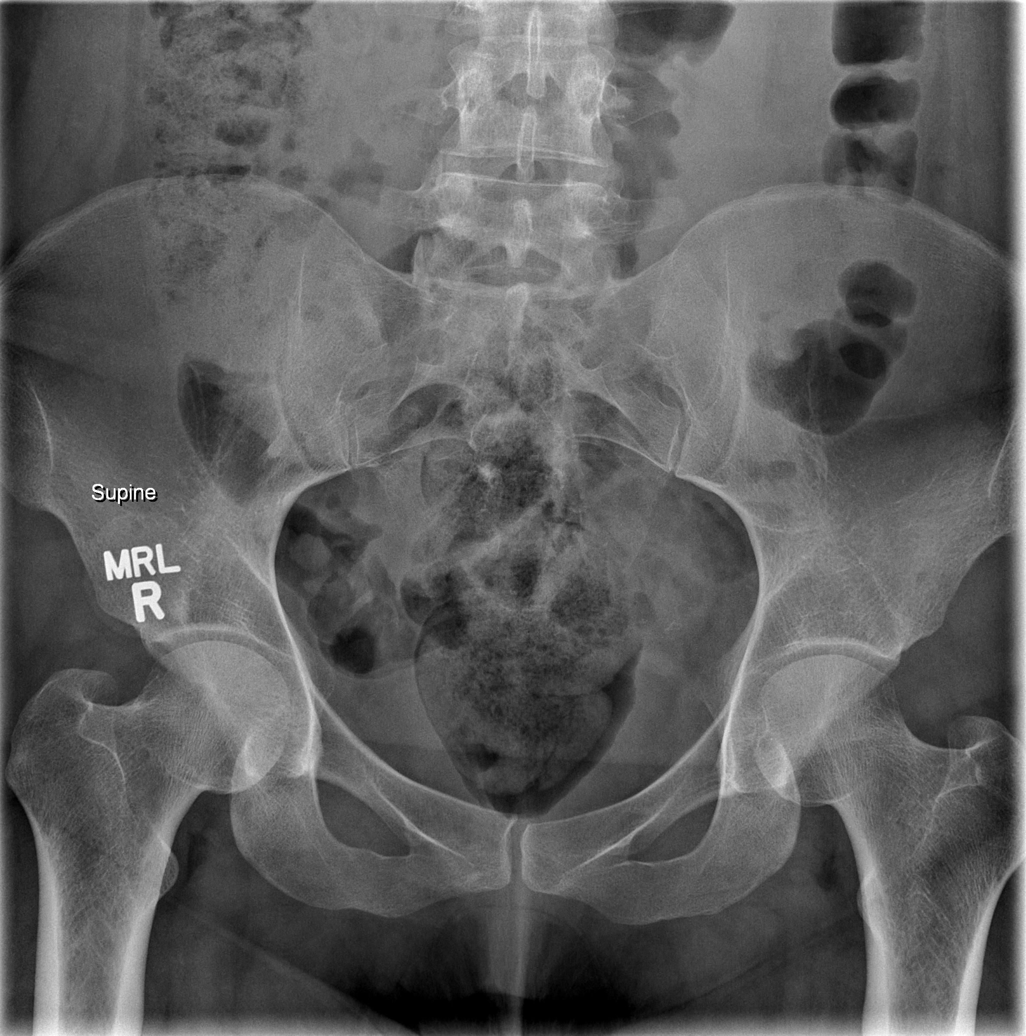

[4 of 4 positions shown; findings below may reference images not displayed]

FINDINGS: Grossly unchanged cardiac silhouette and mediastinal
contours.  No focal parenchymal opacities.  No pleural effusion or
pneumothorax.

Moderate colonic stool burden without evidence of obstruction.  No
pneumoperitoneum, pneumatosis or portal venous gas.

Post cholecystectomy.  Enteric staple line overlies the left upper
abdominal quadrant.  An additional surgical clip overlies the left
mid hemiabdomen.  No definite abnormal intra-abdominal
calcifications.

No acute osseous abnormalities.
IMPRESSION: 1.  No acute cardiopulmonary disease.
2.  Moderate colonic stool burden without evidence of obstruction.

## 2015-04-24 ENCOUNTER — Telehealth (INDEPENDENT_AMBULATORY_CARE_PROVIDER_SITE_OTHER): Payer: Self-pay | Admitting: Internal Medicine

## 2015-04-24 DIAGNOSIS — R1011 Right upper quadrant pain: Secondary | ICD-10-CM

## 2015-04-24 MED ORDER — HYDROCODONE-ACETAMINOPHEN 5-325 MG PO TABS: 1.0000 | ORAL_TABLET | Freq: Four times a day (QID) | ORAL | Status: DC | PRN

## 2015-04-24 NOTE — Telephone Encounter (Signed)
I spoke with patient this morning. She says the Lidoderm patch is not working. I am going to give an Rx for Norco 5-325mg . It must last 30 days. She is to keep her appt with [place] [facility]

## 2015-04-29 ENCOUNTER — Ambulatory Visit: Payer: Self-pay | Admitting: Nurse Practitioner

## 2015-04-30 ENCOUNTER — Telehealth (INDEPENDENT_AMBULATORY_CARE_PROVIDER_SITE_OTHER): Payer: Self-pay | Admitting: *Deleted

## 2015-04-30 NOTE — Telephone Encounter (Signed)
Raquel Sarna LM asking for Terri to return her call at [phone].

## 2015-04-30 NOTE — Telephone Encounter (Signed)
Patient called and she wanted a stronger pain medication. I told her I could not give her any more pain medication and it must last 30 days.

## 2015-05-05 ENCOUNTER — Emergency Department (HOSPITAL_COMMUNITY)
Admission: EM | Admit: 2015-05-05 | Discharge: 2015-05-05 | Disposition: A | Discharge status: Home / Self Care | Payer: 59 | Attending: Emergency Medicine | Admitting: Emergency Medicine

## 2015-05-05 ENCOUNTER — Encounter (HOSPITAL_COMMUNITY): Payer: Self-pay | Admitting: Emergency Medicine

## 2015-05-05 DIAGNOSIS — Z87448 Personal history of other diseases of urinary system: Secondary | ICD-10-CM | POA: Insufficient documentation

## 2015-05-05 DIAGNOSIS — F419 Anxiety disorder, unspecified: Secondary | ICD-10-CM | POA: Insufficient documentation

## 2015-05-05 DIAGNOSIS — G43909 Migraine, unspecified, not intractable, without status migrainosus: Secondary | ICD-10-CM | POA: Insufficient documentation

## 2015-05-05 DIAGNOSIS — K253 Acute gastric ulcer without hemorrhage or perforation: Secondary | ICD-10-CM | POA: Diagnosis not present

## 2015-05-05 DIAGNOSIS — E669 Obesity, unspecified: Secondary | ICD-10-CM | POA: Diagnosis not present

## 2015-05-05 DIAGNOSIS — Z720 Tobacco use: Secondary | ICD-10-CM | POA: Diagnosis not present

## 2015-05-05 DIAGNOSIS — Z8679 Personal history of other diseases of the circulatory system: Secondary | ICD-10-CM | POA: Diagnosis not present

## 2015-05-05 DIAGNOSIS — Z8719 Personal history of other diseases of the digestive system: Secondary | ICD-10-CM | POA: Insufficient documentation

## 2015-05-05 DIAGNOSIS — G8929 Other chronic pain: Secondary | ICD-10-CM | POA: Diagnosis not present

## 2015-05-05 DIAGNOSIS — R109 Unspecified abdominal pain: Secondary | ICD-10-CM

## 2015-05-05 DIAGNOSIS — Z9049 Acquired absence of other specified parts of digestive tract: Secondary | ICD-10-CM | POA: Insufficient documentation

## 2015-05-05 DIAGNOSIS — Z9889 Other specified postprocedural states: Secondary | ICD-10-CM | POA: Insufficient documentation

## 2015-05-05 DIAGNOSIS — Z91040 Latex allergy status: Secondary | ICD-10-CM | POA: Insufficient documentation

## 2015-05-05 DIAGNOSIS — Z79899 Other long term (current) drug therapy: Secondary | ICD-10-CM | POA: Diagnosis not present

## 2015-05-05 DIAGNOSIS — D649 Anemia, unspecified: Secondary | ICD-10-CM | POA: Insufficient documentation

## 2015-05-05 DIAGNOSIS — Z8742 Personal history of other diseases of the female genital tract: Secondary | ICD-10-CM | POA: Insufficient documentation

## 2015-05-05 DIAGNOSIS — F329 Major depressive disorder, single episode, unspecified: Secondary | ICD-10-CM | POA: Insufficient documentation

## 2015-05-05 DIAGNOSIS — R011 Cardiac murmur, unspecified: Secondary | ICD-10-CM | POA: Diagnosis not present

## 2015-05-05 DIAGNOSIS — R1011 Right upper quadrant pain: Secondary | ICD-10-CM | POA: Diagnosis present

## 2015-05-05 LAB — CBC WITH DIFFERENTIAL/PLATELET
Basophils Absolute: 0 10*3/uL (ref 0.0–0.1)
Basophils Relative: 0 % (ref 0–1)
EOS ABS: 0.1 10*3/uL (ref 0.0–0.7)
Eosinophils Relative: 2 % (ref 0–5)
HEMATOCRIT: 35.3 % — AB (ref 36.0–46.0)
Hemoglobin: 11.3 g/dL — ABNORMAL LOW (ref 12.0–15.0)
Lymphocytes Relative: 50 % — ABNORMAL HIGH (ref 12–46)
Lymphs Abs: 3.2 10*3/uL (ref 0.7–4.0)
MCH: 25.3 pg — ABNORMAL LOW (ref 26.0–34.0)
MCHC: 32 g/dL (ref 30.0–36.0)
MCV: 79 fL (ref 78.0–100.0)
MONOS PCT: 9 % (ref 3–12)
Monocytes Absolute: 0.6 10*3/uL (ref 0.1–1.0)
NEUTROS ABS: 2.5 10*3/uL (ref 1.7–7.7)
Neutrophils Relative %: 39 % — ABNORMAL LOW (ref 43–77)
Platelets: 279 10*3/uL (ref 150–400)
RBC: 4.47 MIL/uL (ref 3.87–5.11)
RDW: 21.1 % — ABNORMAL HIGH (ref 11.5–15.5)
WBC: 6.4 10*3/uL (ref 4.0–10.5)

## 2015-05-05 LAB — URINALYSIS, ROUTINE W REFLEX MICROSCOPIC
Bilirubin Urine: NEGATIVE
Glucose, UA: NEGATIVE mg/dL
HGB URINE DIPSTICK: NEGATIVE
Ketones, ur: NEGATIVE mg/dL
Nitrite: NEGATIVE
PROTEIN: NEGATIVE mg/dL
Specific Gravity, Urine: 1.017 (ref 1.005–1.030)
Urobilinogen, UA: 1 mg/dL (ref 0.0–1.0)
pH: 7 (ref 5.0–8.0)

## 2015-05-05 LAB — COMPREHENSIVE METABOLIC PANEL
ALT: 35 U/L (ref 14–54)
AST: 28 U/L (ref 15–41)
Albumin: 4.5 g/dL (ref 3.5–5.0)
Alkaline Phosphatase: 96 U/L (ref 38–126)
Anion gap: 11 (ref 5–15)
BILIRUBIN TOTAL: 0.5 mg/dL (ref 0.3–1.2)
BUN: 12 mg/dL (ref 6–20)
CHLORIDE: 104 mmol/L (ref 101–111)
CO2: 22 mmol/L (ref 22–32)
CREATININE: 0.77 mg/dL (ref 0.44–1.00)
Calcium: 9.5 mg/dL (ref 8.9–10.3)
GFR calc Af Amer: >60 mL/min (ref >60)
GFR calc non Af Amer: >60 mL/min (ref >60)
GLUCOSE: 92 mg/dL (ref 65–99)
POTASSIUM: 3.7 mmol/L (ref 3.5–5.1)
Sodium: 137 mmol/L (ref 135–145)
TOTAL PROTEIN: 7.8 g/dL (ref 6.5–8.1)

## 2015-05-05 LAB — I-STAT BETA HCG BLOOD, ED (MC, WL, AP ONLY)

## 2015-05-05 LAB — URINE MICROSCOPIC-ADD ON

## 2015-05-05 LAB — LIPASE, BLOOD: LIPASE: 31 U/L (ref 22–51)

## 2015-05-05 LAB — I-STAT CG4 LACTIC ACID, ED: LACTIC ACID, VENOUS: 0.45 mmol/L — AB (ref 0.5–2.0)

## 2015-05-05 MED ORDER — PROMETHAZINE HCL 25 MG RE SUPP: 25.0000 mg | Freq: Four times a day (QID) | RECTAL | Status: DC | PRN

## 2015-05-05 MED ORDER — CYPROHEPTADINE HCL 2 MG/5ML PO SYRP: 4.0000 mg | ORAL_SOLUTION | Freq: Three times a day (TID) | ORAL | Status: DC | PRN

## 2015-05-05 MED ORDER — METHOCARBAMOL 1000 MG/10ML IJ SOLN
1000.0000 mg | Freq: Once | INTRAVENOUS | Status: AC
  Administered 2015-05-05: 1000 mg via INTRAVENOUS
  Filled 2015-05-05: qty 10

## 2015-05-05 MED ORDER — PROMETHAZINE HCL 25 MG/ML IJ SOLN
25.0000 mg | Freq: Once | INTRAMUSCULAR | Status: AC
  Administered 2015-05-05: 25 mg via INTRAVENOUS
  Filled 2015-05-05: qty 1

## 2015-05-05 MED ORDER — GI COCKTAIL ~~LOC~~
30.0000 mL | Freq: Once | ORAL | Status: AC
  Administered 2015-05-05: 30 mL via ORAL
  Filled 2015-05-05: qty 30

## 2015-05-05 MED ORDER — DICYCLOMINE HCL 10 MG/ML IM SOLN: 20.0000 mg | Freq: Once | INTRAMUSCULAR | Status: DC

## 2015-05-05 MED ORDER — HYDROMORPHONE HCL 2 MG PO TABS
1.0000 mg | ORAL_TABLET | Freq: Once | ORAL | Status: AC
  Administered 2015-05-05: 1 mg via ORAL
  Filled 2015-05-05: qty 1

## 2015-05-05 MED ORDER — MORPHINE SULFATE 4 MG/ML IJ SOLN
4.0000 mg | Freq: Once | INTRAMUSCULAR | Status: AC
  Administered 2015-05-05: 4 mg via INTRAVENOUS
  Filled 2015-05-05: qty 1

## 2015-05-05 MED ORDER — SUCRALFATE 1 G PO TABS
1.0000 g | ORAL_TABLET | Freq: Once | ORAL | Status: AC
  Administered 2015-05-05: 1 g via ORAL
  Filled 2015-05-05: qty 1

## 2015-05-05 MED ORDER — HYDROMORPHONE HCL 2 MG PO TABS: 1.0000 mg | ORAL_TABLET | Freq: Once | ORAL | Status: DC

## 2015-05-05 MED ORDER — FAMOTIDINE IN NACL 20-0.9 MG/50ML-% IV SOLN
20.0000 mg | Freq: Once | INTRAVENOUS | Status: AC
  Administered 2015-05-05: 20 mg via INTRAVENOUS
  Filled 2015-05-05: qty 50

## 2015-05-05 MED ORDER — METHOCARBAMOL 1000 MG/10ML IJ SOLN: 1000.0000 mg | Freq: Once | INTRAMUSCULAR | Status: DC

## 2015-05-05 MED ORDER — SODIUM CHLORIDE 0.9 % IV BOLUS (SEPSIS)
1000.0000 mL | Freq: Once | INTRAVENOUS | Status: AC
  Administered 2015-05-05: 1000 mL via INTRAVENOUS

## 2015-05-05 NOTE — ED Notes (Signed)
Pt reports Morphine is making abdominal pain worse. ED-PA notified.

## 2015-05-05 NOTE — ED Provider Notes (Signed)
CSN: [id number]     Arrival date & time 05/05/15  1950 History   First MD Initiated Contact with Patient 05/05/15 0615     Chief Complaint  Patient presents with  . Hematemesis  . Abdominal Pain     (Consider location/radiation/quality/duration/timing/severity/associated sxs/prior Treatment) HPI  Penny [name] is a 29 y.o. female with past medical history significant for gastric bypass ( 2007 at [facility]), IBS, complaining of 5 episodes of hematemesis onset 0200 associated with 7 out of 10 right upper quadrant pain. States she's had this chronically for the last several months. Patient was recently admitted for similar, EGD showed a gastric ulcer. Patient has been taking hydrocodone and Phenergan at home with little relief. She's been referred to a gastroenterologist @ [facility] on July 27. Denies fever, chills, passing flatus normally with regular bowel movements.   [facility]  Past Medical History  Diagnosis Date  . Migraines   . Interstitial cystitis   . IUD FEB 2010  . Gastritis JULY 2011  . Depression   . 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 Fatty liver   . Obesity (BMI 30-39.9) 2011 228 LBS BMI 36.8  . Iron deficiency anemia 07/23/2010  . Irritable bowel syndrome 2012 DIARRHEA    JUN 2012 TTG IgA 14.9  . Elevated liver enzymes JUL 2011ALK PHOS 111-127 AST  143-267 ALT  213-321T BILI 0.6  ALB  3.7-4.06 Jun 2011 ALK PHOS 118 AST 24 ALT 42 T BILI 0.4 ALB 3.9  . Chronic daily headache   . Ovarian cyst   . ADD (attention deficit disorder)   . RLQ abdominal pain 07/18/2013  . Menorrhagia 07/18/2013  . Anxiety   . Potassium (K) deficiency   . Dysmenorrhea 09/18/2013  . Stress 09/18/2013  . Patient desires pregnancy 09/18/2013  . Pregnant 12/25/2013  . Blood transfusion without reported diagnosis   . Heart murmur   . Gastric bypass status for obesity   . 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 Dysrhythmia   . Sciatica of left side 11/14/2014  . Fibromyalgia    . 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 PONV (postoperative nausea and vomiting) seizure post-operatively   Past Surgical History  Procedure Laterality Date  . Gab  2007    in [place]-POUCH 5 CM  . Cholecystectomy  2005    biliary dyskinesia  . Cath self every nite      for sodium bicarb injection (discontinued 2013)  . Colonoscopy  JUN 2012 ABD PN/DIARRHEA WITH PROPOFOL    NL COLON  . Upper gastrointestinal endoscopy  JULY 2011 NAUSEA-D125,V6, PH 25    Bx; GASTRITIS, POUCH-5 CM LONG  . Dilation and curettage of uterus    . Tonsillectomy    . Gastric bypass  06/2006  . Savory dilation  06/20/2012    Dr. Barnie Alderman gastritis/Ulcer in the mid jejunum. Empiric dilation.   . Tonsillectomy and adenoidectomy    . Esophagogastroduodenoscopy    . Wisdom tooth extraction    . Repair vaginal cuff N/A 07/30/2014    Procedure: REPAIR VAGINAL CUFF;  Surgeon: Mora Bellman, MD;  Location: [facility];  Service: Gynecology;  Laterality: N/A;  . Hysteroscopy w/d&c N/A 09/12/2014    Procedure: DILATATION AND CURETTAGE /HYSTEROSCOPY;  Surgeon: Jonnie Kind, MD;  Location: [facility];  Service: Gynecology;  Laterality: N/A;  . Esophagogastroduodenoscopy (egd) with propofol N/A 04/10/2015    Procedure: ESOPHAGOGASTRODUODENOSCOPY (EGD) WITH PROPOFOL;  Surgeon: Daneil Dolin, MD;  Location: [facility];  Service: Endoscopy;  Laterality: N/A;  . Esophageal dilation N/A 04/10/2015    Procedure: ESOPHAGEAL DILATION WITH 54FR MALONEY DILATOR;  Surgeon: Daneil Dolin, MD;  Location: [facility];  Service: Endoscopy;  Laterality: N/A;  . Esophageal biopsy  04/10/2015    Procedure: BIOPSY;  Surgeon: Daneil Dolin, MD;  Location: [facility];  Service: Endoscopy;;   Family History  Problem Relation Age of Onset  . Hemochromatosis Maternal Grandmother   . Migraines Maternal Grandmother   . Cancer Maternal Grandmother   . Breast cancer Maternal Grandmother   . Hypertension Father   . Diabetes Father    . Coronary artery disease Father   . Migraines Paternal Grandmother   . Breast cancer Paternal Grandmother   . Cancer Mother     breast  . Hemochromatosis Mother   . Breast cancer Mother   . Coronary artery disease Paternal Grandfather    History  Substance Use Topics  . Smoking status: Current Every Day Smoker -- 0.50 packs/day for 6 years    Types: Cigarettes  . Smokeless tobacco: Never Used     Comment: smokes 1 cigarette a day  . Alcohol Use: No   OB History    Gravida Para Term Preterm AB TAB SAB Ectopic Multiple Living   '3 2 1 1 1  1   2     '$ Review of Systems  10 systems reviewed and found to be negative, except as noted in the HPI.   Allergies  Gabapentin; Metoclopramide hcl; Codeine; Zofran; Latex; and Tape  Home Medications   Prior to Admission medications   Medication Sig Start Date End Date Taking? Authorizing Provider  clonazePAM (KLONOPIN) 0.5 MG tablet Take 1 tablet (0.5 mg total) by mouth 3 (three) times daily as needed for anxiety. 04/10/15  Yes Lezlie Octave Black, NP  cyclobenzaprine (FLEXERIL) 5 MG tablet TAKE 1 TABLET BY MOUTH EVERY EIGHT HOURS AS NEEDED FOR MUSCLE SPASMS. 03/17/15  Yes Kathrynn Ducking, MD  DULoxetine (CYMBALTA) 30 MG capsule Take 1-2 capsules (30-60 mg total) by mouth 2 (two) times daily. Take 1 capsule (30 mg) every morning and 2 capsules (60 mg) every night 02/24/15  Yes Albertine Patricia, MD  HYDROcodone-acetaminophen (NORCO/VICODIN) 5-325 MG per tablet Take 1 tablet by mouth every 6 (six) hours as needed. Patient taking differently: Take 1 tablet by mouth every 6 (six) hours as needed for moderate pain.  04/24/15  Yes Butch Penny, NP  hyoscyamine (LEVSIN SL) 0.125 MG SL tablet Place 1 tablet (0.125 mg total) under the tongue every 4 (four) hours as needed. Patient taking differently: Place 0.125 mg under the tongue every 4 (four) hours as needed for cramping.  04/17/15  Yes Orvil Feil, NP  lidocaine (LIDODERM) 5 % Place 1 patch onto the  skin daily. Remove & Discard patch within 12 hours or as directed by MD Patient taking differently: Place 1 patch onto the skin daily as needed (pain). Remove & Discard patch within 12 hours or as directed by MD 04/22/15  Yes Butch Penny, NP  pantoprazole (PROTONIX) 40 MG tablet Take 1 tablet (40 mg total) by mouth 2 (two) times daily before a meal. 04/10/15  Yes Radene Gunning, NP  promethazine (PHENERGAN) 25 MG tablet Take 1 tablet (25 mg total) by mouth every 6 (six) hours as needed for nausea or vomiting. 04/15/15  Yes Noemi Chapel, MD  topiramate (TOPAMAX) 100 MG  tablet Take 1 tablet (100 mg total) by mouth at bedtime. 02/18/15  Yes Kathrynn Ducking, MD  zolpidem (AMBIEN) 5 MG tablet Take 1 tablet (5 mg total) by mouth at bedtime as needed for sleep. 12/26/14  Yes Kathyrn Drown, MD  cyproheptadine (PERIACTIN) 2 MG/5ML syrup Take 10 mLs (4 mg total) by mouth 3 (three) times daily as needed (abd pain). 05/05/15   Penny Archambeau, PA-C  ferrous sulfate 325 (65 FE) MG tablet Take 1 tablet (325 mg total) by mouth 2 (two) times daily with a meal. Patient not taking: Reported on 04/22/2015 02/06/15   Okey Regal, PA-C  promethazine (PHENERGAN) 25 MG suppository Place 1 suppository (25 mg total) rectally every 6 (six) hours as needed for nausea or vomiting. 05/05/15   Elmyra Ricks Jameal Razzano, PA-C  sucralfate (CARAFATE) 1 GM/10ML suspension Take 10 mLs (1 g total) by mouth 4 (four) times daily -  with meals and at bedtime. Patient not taking: Reported on 04/22/2015 04/10/15   Lezlie Octave Black, NP   BP 106/61 mmHg  Pulse 80  Temp(Src) 97.9 F (36.6 C) (Oral)  Resp 18  Ht $R'5\' 6"'KU$  (1.676 m)  Wt 234 lb (106.142 kg)  BMI 37.79 kg/m2  SpO2 96%  LMP 04/14/2015 Physical Exam  Constitutional: She is oriented to person, place, and time. She appears well-developed and well-nourished. No distress.  HENT:  Head: Normocephalic.  Mouth/Throat: Oropharynx is clear and moist.  MMM  Eyes: Conjunctivae and EOM are normal.  Pupils are equal, round, and reactive to light.  Neck: Normal range of motion.  Cardiovascular: Normal rate, regular rhythm and intact distal pulses.   Pulmonary/Chest: Effort normal and breath sounds normal. No stridor. No respiratory distress. She has no wheezes. She has no rales. She exhibits no tenderness.  Abdominal: Soft. Bowel sounds are normal. She exhibits no distension and no mass. There is tenderness. There is no rebound and no guarding.  Musculoskeletal: Normal range of motion.  Neurological: She is alert and oriented to person, place, and time.  Psychiatric:  Flat affect  Nursing note and vitals reviewed.   ED Course  Procedures (including critical care time) Labs Review Labs Reviewed  CBC WITH DIFFERENTIAL/PLATELET - Abnormal; Notable for the following:    Hemoglobin 11.3 (*)    HCT 35.3 (*)    MCH 25.3 (*)    RDW 21.1 (*)    Neutrophils Relative % 39 (*)    Lymphocytes Relative 50 (*)    All other components within normal limits  URINALYSIS, ROUTINE W REFLEX MICROSCOPIC (NOT AT [facility]) - Abnormal; Notable for the following:    APPearance CLOUDY (*)    Leukocytes, UA SMALL (*)    All other components within normal limits  URINE MICROSCOPIC-ADD ON - Abnormal; Notable for the following:    Squamous Epithelial / LPF MANY (*)    Bacteria, UA MANY (*)    All other components within normal limits  I-STAT CG4 LACTIC ACID, ED - Abnormal; Notable for the following:    Lactic Acid, Venous 0.45 (*)    All other components within normal limits  COMPREHENSIVE METABOLIC PANEL  LIPASE, BLOOD  I-STAT BETA HCG BLOOD, ED ([facility], [facility], [facility] ONLY)  I-STAT CG4 LACTIC ACID, ED    Imaging Review No results found.   EKG Interpretation None      MDM   Final diagnoses:  Chronic abdominal pain  Acute gastric ulcer    Filed Vitals:   05/05/15 5361 05/05/15 4431 05/05/15 0902 05/05/15  0936  BP: 124/73 124/67 98/69 106/61  Pulse: 77 65 69 80  Temp:   97.9 F (36.6 C)   TempSrc:    Oral   Resp: $Remo'19 18  18  'QSbul$ Height:      Weight:      SpO2: 99% 100% 99% 96%    Medications  dicyclomine (BENTYL) injection 20 mg (20 mg Intramuscular Not Given 05/05/15 0853)  sodium chloride 0.9 % bolus 1,000 mL (0 mLs Intravenous Stopped 05/05/15 0743)  promethazine (PHENERGAN) injection 25 mg (25 mg Intravenous Given 05/05/15 0655)  morphine 4 MG/ML injection 4 mg (4 mg Intravenous Given 05/05/15 0656)  famotidine (PEPCID) IVPB 20 mg premix (0 mg Intravenous Stopped 05/05/15 0718)  sucralfate (CARAFATE) tablet 1 g (1 g Oral Given 05/05/15 0814)  gi cocktail (Maalox,Lidocaine,Donnatal) (30 mLs Oral Given 05/05/15 0814)  methocarbamol (ROBAXIN) 1,000 mg in dextrose 5 % 50 mL IVPB (0 mg Intravenous Stopped 05/05/15 0853)  HYDROmorphone (DILAUDID) tablet 1 mg (1 mg Oral Given 05/05/15 0856)    Penny [name] is a pleasant 29 y.o. female presenting with hematemesis and exacerbation of her chronic abdominal pain. Patient is afebrile, well-appearing, appears well-hydrated. Abdominal exam is nonsurgical. We'll check basic blood work, by mouth challenge.  07:50 occult patient's room, she is cramping, eyes are open, oriented 3 states that morphine causes seizures and she's having a seizure now she is explaining this to me.  Explined to Pt that this is not a seizure.   DEA review shows 90+ tabs of hydrocodone oxycodone dispensed in the last 30 days from 4 providers. Pt rec'd a 30 day supply from her gastroenterologist 10 days ago.   Patient reassessed with attending physician: Was explained to her that we will not give IV Dilaudid for chronic pain, we reassured her that all the blood work, urinalysis and physical exam findings were reassuring. Patient states that her Vicodin, hycosamine, protonix, carafate,,  are not helpful.   Evaluation does not show pathology that would require ongoing emergent intervention or inpatient treatment. Pt is hemodynamically stable and mentating appropriately. Discussed  findings and plan with patient/guardian, who agrees with care plan. All questions answered. Return precautions discussed and outpatient follow up given.   Discharge Medication List as of 05/05/2015  9:35 AM    START taking these medications   Details  cyproheptadine (PERIACTIN) 2 MG/5ML syrup Take 10 mLs (4 mg total) by mouth 3 (three) times daily as needed (abd pain)., Starting 05/05/2015, Until Discontinued, Print    promethazine (PHENERGAN) 25 MG suppository Place 1 suppository (25 mg total) rectally every 6 (six) hours as needed for nausea or vomiting., Starting 05/05/2015, Until Discontinued, Print             Monico Blitz, PA-C 05/05/15 [place], MD 05/07/15 2210

## 2015-05-05 NOTE — Discharge Instructions (Signed)
Please follow with your primary care doctor in the next 2 days for a check-up. They must obtain records for further management.  ° °Do not hesitate to return to the Emergency Department for any new, worsening or concerning symptoms.  ° °

## 2015-05-05 NOTE — ED Notes (Signed)
Pt arrives with hematemesis ongoing since 0200. States she's got a known peptic ulcer. R sided constant stabbing abdominal pain 7/10, some chest pain 5/10. States hydrocodone isn't helping with pain. States she hasn't been able to drink anything d/t nausea. Abd pain/nausea started around 2230 last night.  States she's been experiencing s/s similar, hasn't been seen by the specialist yet.

## 2015-05-05 NOTE — ED Notes (Signed)
Gave pt. Some water to drink

## 2015-05-07 ENCOUNTER — Telehealth (INDEPENDENT_AMBULATORY_CARE_PROVIDER_SITE_OTHER): Payer: Self-pay | Admitting: *Deleted

## 2015-05-07 NOTE — Telephone Encounter (Signed)
I advised her that if they had a cancellation they would call her. This is the earliest they could do.

## 2015-05-07 NOTE — Telephone Encounter (Signed)
Patient wants to see if we can move her appt up at [facility] to be seen sooner.   Been to ER and understands we can't do much more for her. Only able to drink protein drinks  Please call her ASAP

## 2015-05-08 ENCOUNTER — Ambulatory Visit: Payer: 59 | Admitting: Gastroenterology

## 2015-05-09 ENCOUNTER — Ambulatory Visit: Payer: Self-pay | Admitting: Neurology

## 2015-05-09 ENCOUNTER — Emergency Department (HOSPITAL_COMMUNITY): Payer: Medicaid Other

## 2015-05-09 ENCOUNTER — Telehealth: Payer: Self-pay

## 2015-05-09 ENCOUNTER — Inpatient Hospital Stay (HOSPITAL_COMMUNITY)
Admission: AD | Admit: 2015-05-09 | Discharge: 2015-05-15 | DRG: 885 | Disposition: A | Discharge status: Home / Self Care | Payer: 59 | Source: Intra-hospital | Attending: Emergency Medicine | Admitting: Emergency Medicine

## 2015-05-09 ENCOUNTER — Emergency Department (HOSPITAL_COMMUNITY): Payer: 59

## 2015-05-09 ENCOUNTER — Other Ambulatory Visit: Payer: Self-pay

## 2015-05-09 ENCOUNTER — Encounter (HOSPITAL_COMMUNITY): Payer: Self-pay

## 2015-05-09 ENCOUNTER — Emergency Department (HOSPITAL_COMMUNITY)
Admission: EM | Admit: 2015-05-09 | Discharge: 2015-05-09 | Disposition: A | Discharge status: Other Acute Inpatient Hospital | Payer: Medicaid Other | Attending: Emergency Medicine | Admitting: Emergency Medicine

## 2015-05-09 ENCOUNTER — Emergency Department (HOSPITAL_COMMUNITY)
Admission: EM | Admit: 2015-05-09 | Discharge: 2015-05-09 | Disposition: A | Discharge status: Home / Self Care | Payer: 59 | Attending: Emergency Medicine | Admitting: Emergency Medicine

## 2015-05-09 DIAGNOSIS — R569 Unspecified convulsions: Secondary | ICD-10-CM

## 2015-05-09 DIAGNOSIS — Z8742 Personal history of other diseases of the female genital tract: Secondary | ICD-10-CM | POA: Diagnosis not present

## 2015-05-09 DIAGNOSIS — F445 Conversion disorder with seizures or convulsions: Secondary | ICD-10-CM | POA: Diagnosis present

## 2015-05-09 DIAGNOSIS — G40909 Epilepsy, unspecified, not intractable, without status epilepticus: Secondary | ICD-10-CM | POA: Diagnosis present

## 2015-05-09 DIAGNOSIS — G2581 Restless legs syndrome: Secondary | ICD-10-CM | POA: Diagnosis present

## 2015-05-09 DIAGNOSIS — M797 Fibromyalgia: Secondary | ICD-10-CM | POA: Insufficient documentation

## 2015-05-09 DIAGNOSIS — G9340 Encephalopathy, unspecified: Secondary | ICD-10-CM | POA: Diagnosis present

## 2015-05-09 DIAGNOSIS — R45851 Suicidal ideations: Secondary | ICD-10-CM | POA: Diagnosis present

## 2015-05-09 DIAGNOSIS — R011 Cardiac murmur, unspecified: Secondary | ICD-10-CM | POA: Insufficient documentation

## 2015-05-09 DIAGNOSIS — Z833 Family history of diabetes mellitus: Secondary | ICD-10-CM | POA: Diagnosis not present

## 2015-05-09 DIAGNOSIS — Z9884 Bariatric surgery status: Secondary | ICD-10-CM | POA: Diagnosis not present

## 2015-05-09 DIAGNOSIS — Z720 Tobacco use: Secondary | ICD-10-CM | POA: Insufficient documentation

## 2015-05-09 DIAGNOSIS — K219 Gastro-esophageal reflux disease without esophagitis: Secondary | ICD-10-CM | POA: Diagnosis present

## 2015-05-09 DIAGNOSIS — F419 Anxiety disorder, unspecified: Secondary | ICD-10-CM | POA: Insufficient documentation

## 2015-05-09 DIAGNOSIS — F329 Major depressive disorder, single episode, unspecified: Secondary | ICD-10-CM | POA: Diagnosis not present

## 2015-05-09 DIAGNOSIS — K2970 Gastritis, unspecified, without bleeding: Secondary | ICD-10-CM | POA: Diagnosis not present

## 2015-05-09 DIAGNOSIS — F17210 Nicotine dependence, cigarettes, uncomplicated: Secondary | ICD-10-CM | POA: Diagnosis present

## 2015-05-09 DIAGNOSIS — K760 Fatty (change of) liver, not elsewhere classified: Secondary | ICD-10-CM | POA: Diagnosis present

## 2015-05-09 DIAGNOSIS — E669 Obesity, unspecified: Secondary | ICD-10-CM | POA: Diagnosis not present

## 2015-05-09 DIAGNOSIS — Z8639 Personal history of other endocrine, nutritional and metabolic disease: Secondary | ICD-10-CM | POA: Insufficient documentation

## 2015-05-09 DIAGNOSIS — F53 Postpartum depression: Secondary | ICD-10-CM | POA: Diagnosis present

## 2015-05-09 DIAGNOSIS — S199XXA Unspecified injury of neck, initial encounter: Secondary | ICD-10-CM | POA: Diagnosis not present

## 2015-05-09 DIAGNOSIS — G4089 Other seizures: Secondary | ICD-10-CM | POA: Diagnosis present

## 2015-05-09 DIAGNOSIS — Y92410 Unspecified street and highway as the place of occurrence of the external cause: Secondary | ICD-10-CM | POA: Insufficient documentation

## 2015-05-09 DIAGNOSIS — Z862 Personal history of diseases of the blood and blood-forming organs and certain disorders involving the immune mechanism: Secondary | ICD-10-CM | POA: Insufficient documentation

## 2015-05-09 DIAGNOSIS — F332 Major depressive disorder, recurrent severe without psychotic features: Principal | ICD-10-CM | POA: Diagnosis present

## 2015-05-09 DIAGNOSIS — Z91040 Latex allergy status: Secondary | ICD-10-CM | POA: Insufficient documentation

## 2015-05-09 DIAGNOSIS — D649 Anemia, unspecified: Secondary | ICD-10-CM | POA: Insufficient documentation

## 2015-05-09 DIAGNOSIS — G43909 Migraine, unspecified, not intractable, without status migrainosus: Secondary | ICD-10-CM | POA: Insufficient documentation

## 2015-05-09 DIAGNOSIS — Z79899 Other long term (current) drug therapy: Secondary | ICD-10-CM | POA: Insufficient documentation

## 2015-05-09 DIAGNOSIS — Z8249 Family history of ischemic heart disease and other diseases of the circulatory system: Secondary | ICD-10-CM

## 2015-05-09 DIAGNOSIS — Y9389 Activity, other specified: Secondary | ICD-10-CM | POA: Diagnosis not present

## 2015-05-09 DIAGNOSIS — Z8719 Personal history of other diseases of the digestive system: Secondary | ICD-10-CM | POA: Insufficient documentation

## 2015-05-09 DIAGNOSIS — K589 Irritable bowel syndrome without diarrhea: Secondary | ICD-10-CM | POA: Insufficient documentation

## 2015-05-09 DIAGNOSIS — Z8669 Personal history of other diseases of the nervous system and sense organs: Secondary | ICD-10-CM

## 2015-05-09 DIAGNOSIS — O99345 Other mental disorders complicating the puerperium: Secondary | ICD-10-CM | POA: Diagnosis present

## 2015-05-09 DIAGNOSIS — G4700 Insomnia, unspecified: Secondary | ICD-10-CM | POA: Diagnosis present

## 2015-05-09 DIAGNOSIS — Y998 Other external cause status: Secondary | ICD-10-CM | POA: Diagnosis not present

## 2015-05-09 DIAGNOSIS — F410 Panic disorder [episodic paroxysmal anxiety] without agoraphobia: Secondary | ICD-10-CM | POA: Diagnosis present

## 2015-05-09 DIAGNOSIS — Z8739 Personal history of other diseases of the musculoskeletal system and connective tissue: Secondary | ICD-10-CM | POA: Diagnosis not present

## 2015-05-09 LAB — LACTIC ACID, PLASMA
Lactic Acid, Venous: 1 mmol/L (ref 0.5–2.0)
Lactic Acid, Venous: 0.7 mmol/L (ref 0.5–2.0)

## 2015-05-09 LAB — COMPREHENSIVE METABOLIC PANEL
ALK PHOS: 90 U/L (ref 38–126)
ALT: 41 U/L (ref 14–54)
ANION GAP: 9 (ref 5–15)
AST: 24 U/L (ref 15–41)
Albumin: 4 g/dL (ref 3.5–5.0)
BILIRUBIN TOTAL: 0.5 mg/dL (ref 0.3–1.2)
BUN: 11 mg/dL (ref 6–20)
CALCIUM: 8.7 mg/dL — AB (ref 8.9–10.3)
CHLORIDE: 112 mmol/L — AB (ref 101–111)
CO2: 20 mmol/L — AB (ref 22–32)
Creatinine, Ser: 0.69 mg/dL (ref 0.44–1.00)
GFR calc non Af Amer: >60 mL/min (ref >60)
Glucose, Bld: 87 mg/dL (ref 65–99)
POTASSIUM: 3.3 mmol/L — AB (ref 3.5–5.1)
Sodium: 141 mmol/L (ref 135–145)
Total Protein: 6.8 g/dL (ref 6.5–8.1)

## 2015-05-09 LAB — CBC WITH DIFFERENTIAL/PLATELET
BASOS PCT: 0 % (ref 0–1)
Basophils Absolute: 0 10*3/uL (ref 0.0–0.1)
EOS PCT: 1 % (ref 0–5)
Eosinophils Absolute: 0.1 10*3/uL (ref 0.0–0.7)
HCT: 35.5 % — ABNORMAL LOW (ref 36.0–46.0)
HEMOGLOBIN: 11.4 g/dL — AB (ref 12.0–15.0)
Lymphocytes Relative: 41 % (ref 12–46)
Lymphs Abs: 2.7 10*3/uL (ref 0.7–4.0)
MCH: 25.3 pg — ABNORMAL LOW (ref 26.0–34.0)
MCHC: 32.1 g/dL (ref 30.0–36.0)
MCV: 78.7 fL (ref 78.0–100.0)
MONO ABS: 0.6 10*3/uL (ref 0.1–1.0)
Monocytes Relative: 9 % (ref 3–12)
NEUTROS ABS: 3.2 10*3/uL (ref 1.7–7.7)
Neutrophils Relative %: 49 % (ref 43–77)
PLATELETS: 260 10*3/uL (ref 150–400)
RBC: 4.51 MIL/uL (ref 3.87–5.11)
RDW: 21.1 % — ABNORMAL HIGH (ref 11.5–15.5)
WBC: 6.6 10*3/uL (ref 4.0–10.5)

## 2015-05-09 LAB — I-STAT CHEM 8, ED
BUN: 11 mg/dL (ref 6–20)
CALCIUM ION: 1.18 mmol/L (ref 1.12–1.23)
CHLORIDE: 107 mmol/L (ref 101–111)
CREATININE: 0.7 mg/dL (ref 0.44–1.00)
GLUCOSE: 90 mg/dL (ref 65–99)
HEMATOCRIT: 38 % (ref 36.0–46.0)
HEMOGLOBIN: 12.9 g/dL (ref 12.0–15.0)
POTASSIUM: 3.9 mmol/L (ref 3.5–5.1)
SODIUM: 140 mmol/L (ref 135–145)
TCO2: 16 mmol/L (ref 0–100)

## 2015-05-09 LAB — RAPID URINE DRUG SCREEN, HOSP PERFORMED
Amphetamines: NOT DETECTED
BARBITURATES: NOT DETECTED
Benzodiazepines: NOT DETECTED
COCAINE: NOT DETECTED
Opiates: NOT DETECTED
Tetrahydrocannabinol: NOT DETECTED

## 2015-05-09 LAB — I-STAT CG4 LACTIC ACID, ED
Lactic Acid, Venous: 4.21 mmol/L (ref 0.5–2.0)
Lactic Acid, Venous: 0.88 mmol/L (ref 0.5–2.0)

## 2015-05-09 LAB — CBG MONITORING, ED
Glucose-Capillary: 99 mg/dL (ref 65–99)
Glucose-Capillary: 102 mg/dL — ABNORMAL HIGH (ref 65–99)

## 2015-05-09 IMAGING — CT CT ABD-PELV W/ CM
2 of 4 series · 16 of 46 positions shown, 18 images · IV contrast (Omni 300)
Comparison: 09/03/2014

CLINICAL DATA: Upper abdominal pain with nausea and vomiting for 3
days

EXAM:
CT ABDOMEN AND PELVIS WITH CONTRAST
TECHNIQUE: Multidetector CT imaging of the abdomen and pelvis was performed
using the standard protocol following bolus administration of
intravenous contrast.
CONTRAST:  100mL OMNIPAQUE IOHEXOL 300 MG/ML  SOLN

[Series 2: abd/ pelvis 5.0 i30f 1 · axial · 0.77mm/px · z∈[-530,-15]mm · 13 of 113 slices shown, 15 images]
[im 5/113  soft-tissue]
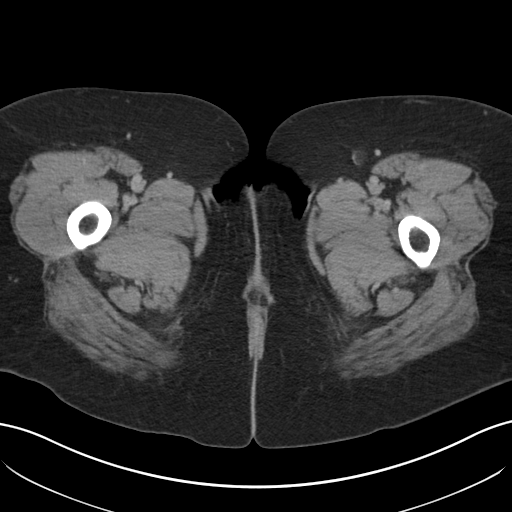
[im 5/113  bone]
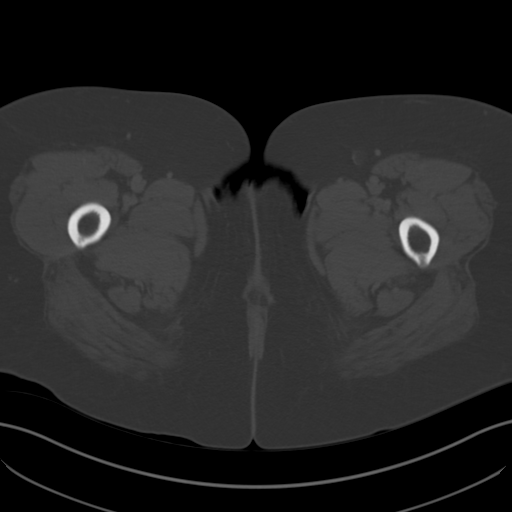
[im 15/113  soft-tissue]
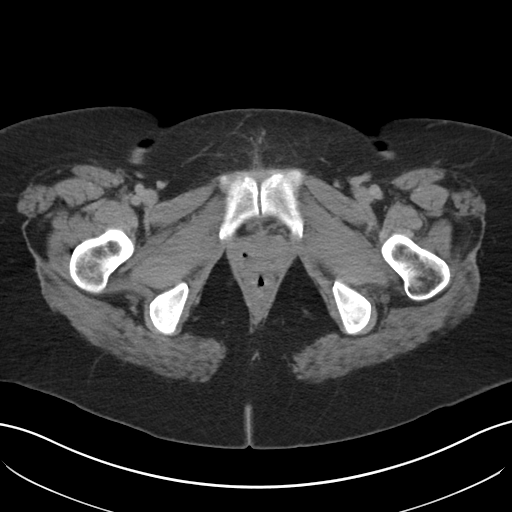
[im 24/113  soft-tissue]
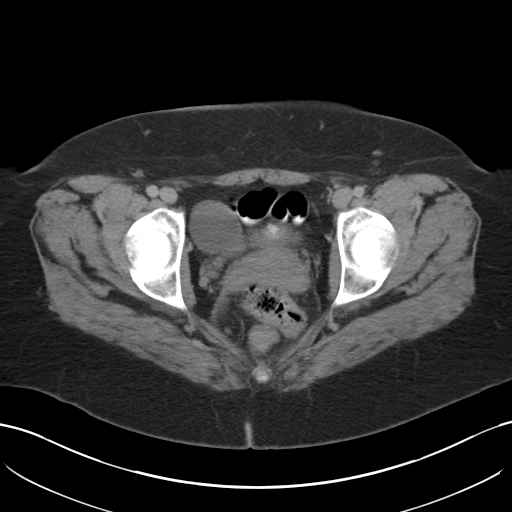
[im 33/113  soft-tissue]
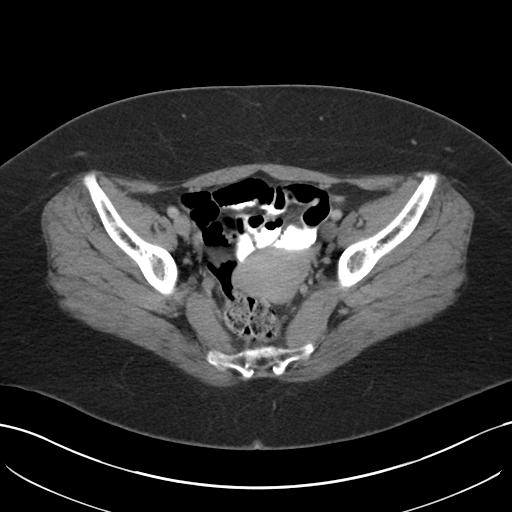
[im 38/113  soft-tissue]
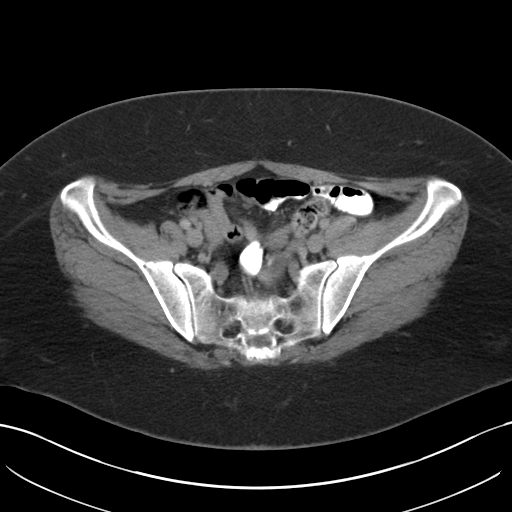
[im 47/113  soft-tissue]
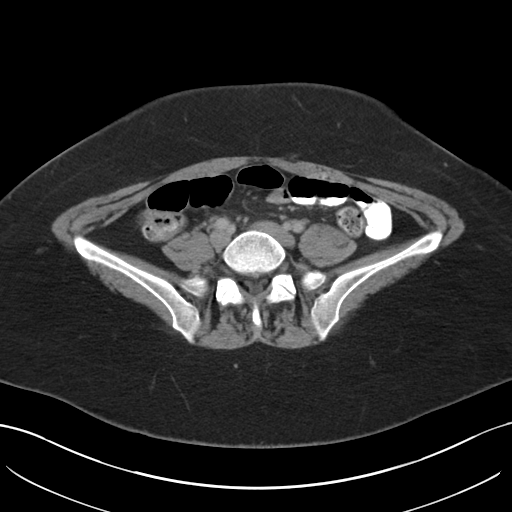
[im 57/113  soft-tissue]
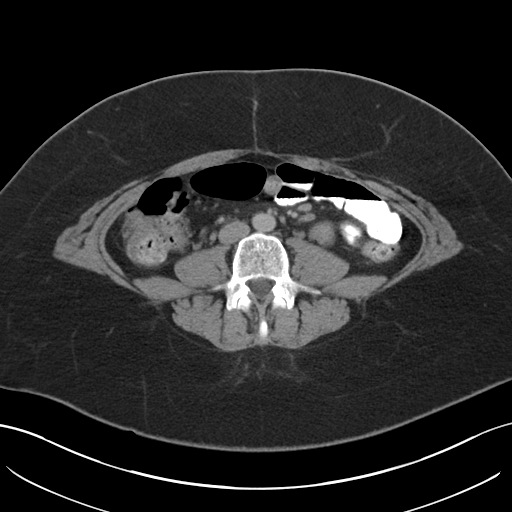
[im 66/113  soft-tissue]
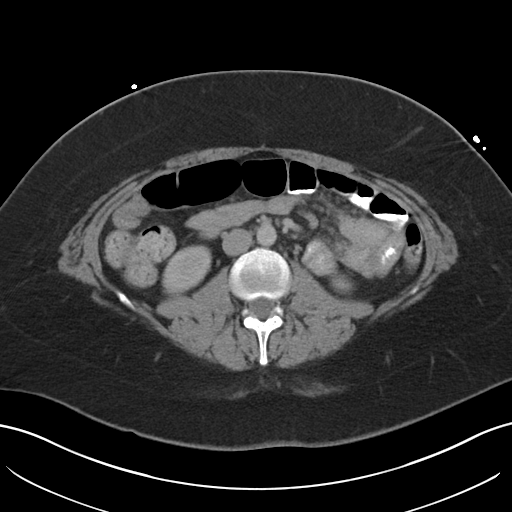
[im 75/113  soft-tissue]
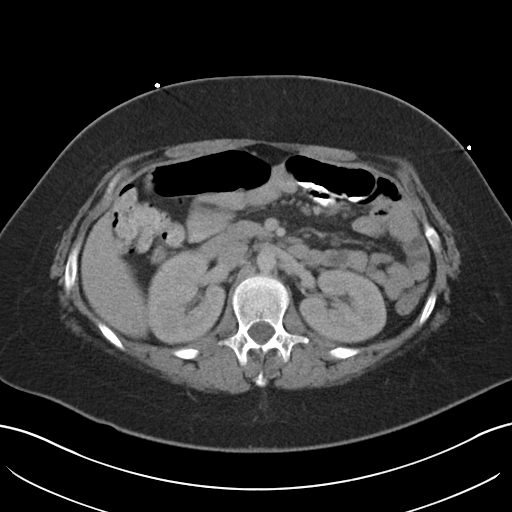
[im 75/113  bone]
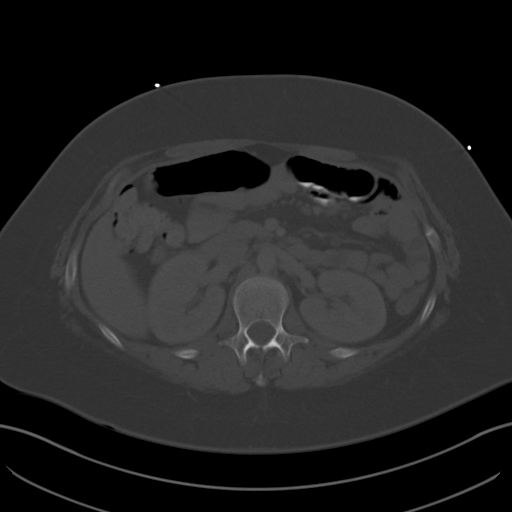
[im 80/113  soft-tissue]
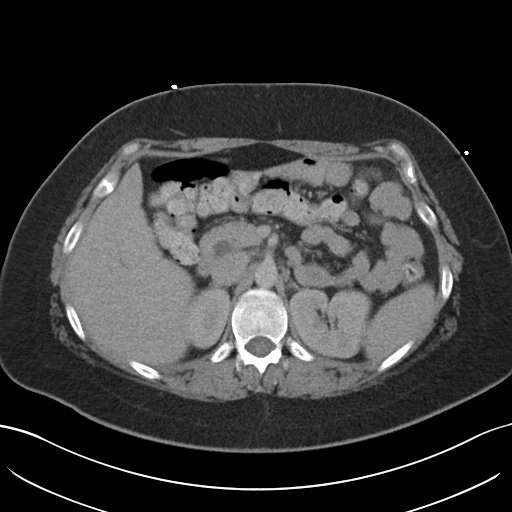
[im 89/113  soft-tissue]
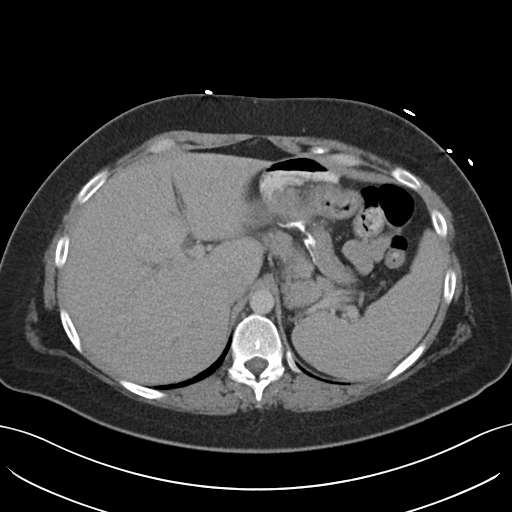
[im 99/113  soft-tissue]
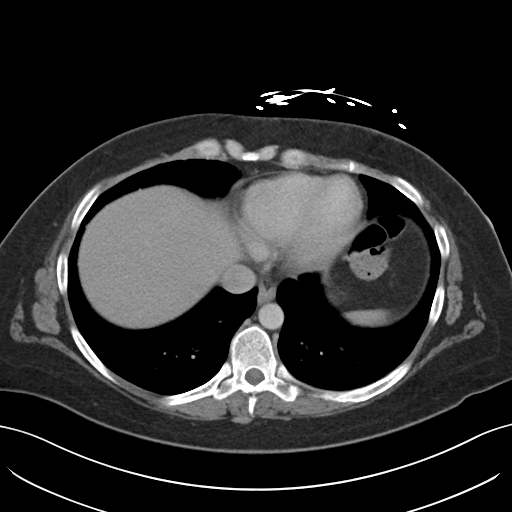
[im 108/113  soft-tissue]
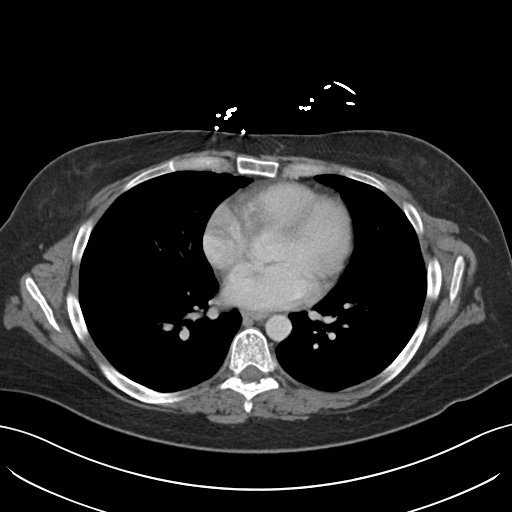

[Series 5: coronals · coronal · 0.81mm/px · 3 of 143 slices shown]
[im 48/143  soft-tissue]
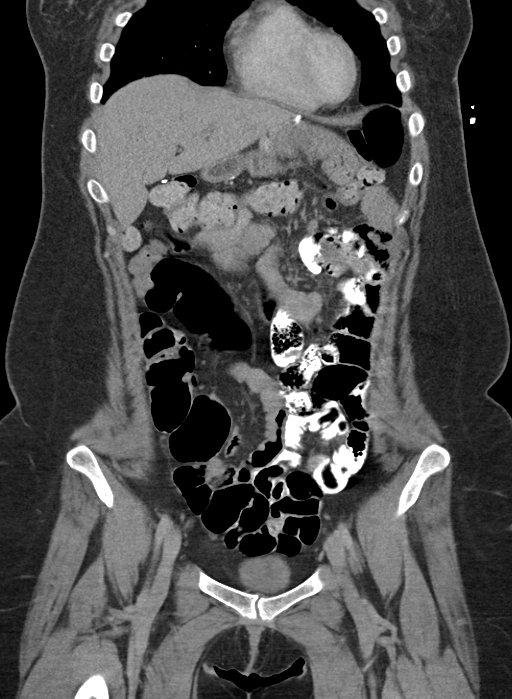
[im 64/143  soft-tissue]
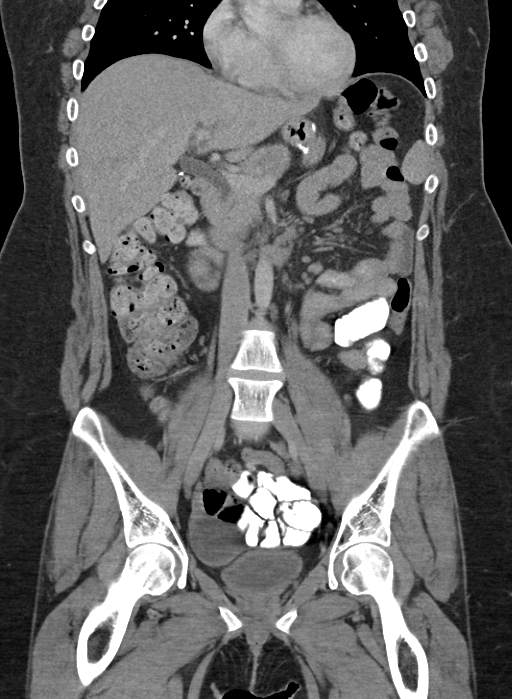
[im 79/143  soft-tissue]
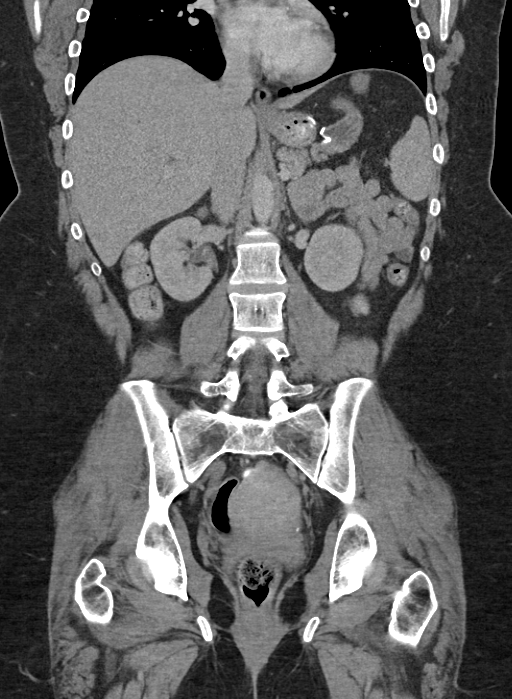

[16 of 46 positions shown; findings below may reference images not displayed]

FINDINGS: BODY WALL: No contributory findings.

LOWER CHEST: No contributory findings.

ABDOMEN/PELVIS:

Liver: No focal abnormality.

Biliary: Cholecystectomy with chronic mild intra and extrahepatic
biliary dilatation, likely reservoir effect given stability.

Pancreas: Unremarkable.

Spleen: Unremarkable.

Adrenals: Unremarkable.

Kidneys and ureters: No hydronephrosis or stone.

Bladder: Unremarkable.

Reproductive: Interval normalization of the uterus. There is a
simple appearing 4 cm cyst in the right ovary. Given development
since August 2014 this is consistent with a benign, likely
physiologic process.

Bowel: Roux-en-Y gastric bypass. No evidence of gastrogastric
fistula, marginal ulcer, or obstruction. Normal appendix.

Retroperitoneum: No mass or adenopathy.

Peritoneum: No ascites or pneumoperitoneum.

Vascular: No acute abnormality.

OSSEOUS: No acute abnormalities.
IMPRESSION: 1. No acute findings.
2. 4 cm right ovarian cyst.
3. Chronic and postoperative changes are described above.

## 2015-05-09 MED ORDER — LORAZEPAM 2 MG/ML IJ SOLN
2.0000 mg | Freq: Once | INTRAMUSCULAR | Status: AC
  Administered 2015-05-09: 2 mg via INTRAMUSCULAR

## 2015-05-09 MED ORDER — HYDROXYZINE HCL 50 MG PO TABS
50.0000 mg | ORAL_TABLET | Freq: Once | ORAL | Status: DC
  Filled 2015-05-09: qty 1

## 2015-05-09 MED ORDER — SODIUM CHLORIDE 0.9 % IV SOLN
Freq: Once | INTRAVENOUS | Status: AC
  Administered 2015-05-09: 02:00:00 via INTRAVENOUS

## 2015-05-09 MED ORDER — SODIUM CHLORIDE 0.9 % IV SOLN
Freq: Once | INTRAVENOUS | Status: AC
  Administered 2015-05-09: 03:00:00 via INTRAVENOUS

## 2015-05-09 MED ORDER — ALUM & MAG HYDROXIDE-SIMETH 200-200-20 MG/5ML PO SUSP: 30.0000 mL | ORAL | Status: DC | PRN

## 2015-05-09 MED ORDER — FERROUS SULFATE 325 (65 FE) MG PO TABS
325.0000 mg | ORAL_TABLET | Freq: Two times a day (BID) | ORAL | Status: DC
  Administered 2015-05-10 – 2015-05-14 (×9): 325 mg via ORAL
  Filled 2015-05-09 (×19): qty 1

## 2015-05-09 MED ORDER — LORAZEPAM 2 MG/ML IJ SOLN
0.5000 mg | Freq: Once | INTRAMUSCULAR | Status: AC
  Administered 2015-05-09: 1 mg via INTRAVENOUS

## 2015-05-09 MED ORDER — ZOLPIDEM TARTRATE 5 MG PO TABS: 5.0000 mg | ORAL_TABLET | Freq: Every evening | ORAL | Status: DC | PRN

## 2015-05-09 MED ORDER — LIDOCAINE 5 % EX PTCH
1.0000 | MEDICATED_PATCH | CUTANEOUS | Status: DC
  Administered 2015-05-09 – 2015-05-11 (×3): 1 via TRANSDERMAL
  Filled 2015-05-09 (×10): qty 1

## 2015-05-09 MED ORDER — LORAZEPAM 2 MG/ML IJ SOLN
1.0000 mg | INTRAMUSCULAR | Status: DC | PRN
  Administered 2015-05-09: 1 mg via INTRAVENOUS
  Filled 2015-05-09: qty 1

## 2015-05-09 MED ORDER — HYDROXYZINE HCL 25 MG PO TABS
25.0000 mg | ORAL_TABLET | Freq: Every evening | ORAL | Status: DC | PRN
  Administered 2015-05-10 – 2015-05-11 (×2): 25 mg via ORAL
  Filled 2015-05-09 (×2): qty 1

## 2015-05-09 MED ORDER — PANTOPRAZOLE SODIUM 40 MG PO TBEC: 40.0000 mg | DELAYED_RELEASE_TABLET | Freq: Two times a day (BID) | ORAL | Status: DC

## 2015-05-09 MED ORDER — MORPHINE SULFATE 4 MG/ML IJ SOLN
4.0000 mg | Freq: Once | INTRAMUSCULAR | Status: AC
  Administered 2015-05-09: 4 mg via INTRAVENOUS
  Filled 2015-05-09: qty 1

## 2015-05-09 MED ORDER — LORAZEPAM 2 MG/ML IJ SOLN
1.0000 mg | Freq: Once | INTRAMUSCULAR | Status: AC
  Administered 2015-05-09: 1 mg via INTRAVENOUS
  Filled 2015-05-09: qty 1

## 2015-05-09 MED ORDER — SUCRALFATE 1 GM/10ML PO SUSP
1.0000 g | Freq: Three times a day (TID) | ORAL | Status: DC
Start: 2015-05-09 — End: 2015-05-09

## 2015-05-09 MED ORDER — NICOTINE 21 MG/24HR TD PT24
21.0000 mg | MEDICATED_PATCH | Freq: Every day | TRANSDERMAL | Status: DC
  Administered 2015-05-10: 21 mg via TRANSDERMAL
  Filled 2015-05-09 (×7): qty 1

## 2015-05-09 MED ORDER — DULOXETINE HCL 30 MG PO CPEP
30.0000 mg | ORAL_CAPSULE | Freq: Two times a day (BID) | ORAL | Status: DC
  Administered 2015-05-09: 30 mg via ORAL
  Filled 2015-05-09: qty 1

## 2015-05-09 MED ORDER — TOPIRAMATE 100 MG PO TABS
100.0000 mg | ORAL_TABLET | Freq: Every day | ORAL | Status: DC
  Filled 2015-05-09: qty 1

## 2015-05-09 MED ORDER — OXYCODONE-ACETAMINOPHEN 5-325 MG PO TABS
2.0000 | ORAL_TABLET | Freq: Once | ORAL | Status: AC
  Administered 2015-05-09: 2 via ORAL
  Filled 2015-05-09: qty 2

## 2015-05-09 MED ORDER — ACETAMINOPHEN 325 MG PO TABS
650.0000 mg | ORAL_TABLET | Freq: Four times a day (QID) | ORAL | Status: DC | PRN
  Administered 2015-05-10 – 2015-05-14 (×6): 650 mg via ORAL
  Filled 2015-05-09 (×7): qty 2

## 2015-05-09 MED ORDER — KETOROLAC TROMETHAMINE 30 MG/ML IJ SOLN
30.0000 mg | Freq: Once | INTRAMUSCULAR | Status: AC
  Administered 2015-05-09: 30 mg via INTRAVENOUS
  Filled 2015-05-09: qty 1

## 2015-05-09 MED ORDER — PANTOPRAZOLE SODIUM 40 MG PO TBEC
40.0000 mg | DELAYED_RELEASE_TABLET | Freq: Two times a day (BID) | ORAL | Status: DC
  Administered 2015-05-10 – 2015-05-15 (×11): 40 mg via ORAL
  Filled 2015-05-09 (×20): qty 1

## 2015-05-09 MED ORDER — MAGNESIUM HYDROXIDE 400 MG/5ML PO SUSP: 30.0000 mL | Freq: Every day | ORAL | Status: DC | PRN

## 2015-05-09 MED ORDER — CYCLOBENZAPRINE HCL 10 MG PO TABS
10.0000 mg | ORAL_TABLET | Freq: Two times a day (BID) | ORAL | Status: DC
  Administered 2015-05-09 – 2015-05-15 (×12): 10 mg via ORAL
  Filled 2015-05-09 (×22): qty 1

## 2015-05-09 MED ORDER — CYCLOBENZAPRINE HCL 10 MG PO TABS
10.0000 mg | ORAL_TABLET | Freq: Three times a day (TID) | ORAL | Status: DC | PRN
  Administered 2015-05-09: 10 mg via ORAL
  Filled 2015-05-09: qty 1

## 2015-05-09 MED ORDER — PROMETHAZINE HCL 25 MG/ML IJ SOLN
12.5000 mg | Freq: Once | INTRAMUSCULAR | Status: AC
  Administered 2015-05-09: 12.5 mg via INTRAVENOUS
  Filled 2015-05-09: qty 1

## 2015-05-09 MED ORDER — ONDANSETRON HCL 4 MG/2ML IJ SOLN
4.0000 mg | Freq: Once | INTRAMUSCULAR | Status: AC
  Administered 2015-05-09: 4 mg via INTRAVENOUS
  Filled 2015-05-09: qty 2

## 2015-05-09 MED ORDER — CLONAZEPAM 0.5 MG PO TABS: 0.5000 mg | ORAL_TABLET | Freq: Three times a day (TID) | ORAL | Status: DC | PRN

## 2015-05-09 NOTE — Tx Team (Signed)
Initial Interdisciplinary Treatment Plan   PATIENT STRESSORS: Financial difficulties Health problems Marital or family conflict Traumatic event   PATIENT STRENGTHS: Average or above average intelligence Communication skills General fund of knowledge Supportive family/friends   PROBLEM LIST: Problem List/Patient Goals Date to be addressed Date deferred Reason deferred Estimated date of resolution  "My klonopin isn't working, I'm really anxious" - anxiety 05/09/2015  05/09/2015    "My klonopin isn't working, I'm really anxious" - medication reassessment 05/09/2015 05/09/2015    "My husband left me" pt states tearfully - depression 05/09/2015 05/09/2015                                         DISCHARGE CRITERIA:  Ability to meet basic life and health needs Improved stabilization in mood, thinking, and/or behavior Medical problems require only outpatient monitoring Need for constant or close observation no longer present Reduction of life-threatening or endangering symptoms to within safe limits  PRELIMINARY DISCHARGE PLAN: Outpatient therapy Participate in family therapy Return to previous living arrangement Return to previous work or school arrangements  PATIENT/FAMIILY INVOLVEMENT: This treatment plan has been presented to and reviewed with the patient, Penny [name] .  The patient and family have been given the opportunity to ask questions and make suggestions.  Penny [name] 05/09/2015, 6:38 PM

## 2015-05-09 NOTE — ED Notes (Signed)
Dr. Sharol Given back at the bedside.
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
## 2015-05-09 NOTE — ED Notes (Signed)
Tele psych in progress at this time. 

## 2015-05-09 NOTE — ED Notes (Signed)
Spoke w/[facility] re: bed availability.  She stated they are still working on placement for patient. Patient notified of this.

## 2015-05-09 NOTE — ED Notes (Signed)
Pt requests Barbaraann Rondo, NP not be over her care.  Dr Sharol Given and Gillermo Murdoch, charge RN notified

## 2015-05-09 NOTE — ED Notes (Addendum)
Seizure activity at this time

## 2015-05-09 NOTE — ED Notes (Signed)
Seizure activity, pt jerking, arms and legs stiff

## 2015-05-09 NOTE — ED Notes (Signed)
Blood sugar 102

## 2015-05-09 NOTE — ED Notes (Addendum)
Seizure activity at this time, pupils dilated, muscles contracted, pt jerking
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
## 2015-05-09 NOTE — ED Notes (Addendum)
Pt actively having seizure at this time

## 2015-05-09 NOTE — ED Notes (Signed)
Pt began seizing in breakroom. Pt brought to treatment room and began seizing twice more. Pt placed on nonrebreather. Pt obtunded. Sats 100%, NSR 70bpm. NP at bedside.

## 2015-05-09 NOTE — ED Notes (Signed)
Neurologist at bedside. 

## 2015-05-09 NOTE — ED Notes (Signed)
Per Jinny Blossom at [facility], pt accepted by Dr.  Parke Poisson at [facility] and can come at any time.
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
## 2015-05-09 NOTE — ED Notes (Signed)
MD at bedside. 

## 2015-05-09 NOTE — ED Notes (Signed)
Seizure activity noted, pt jerking, muscles contracted, pupils dilated.

## 2015-05-09 NOTE — ED Notes (Signed)
PT requesting pain medication via IV and not po medication d/t fear of nausea. MD made aware.

## 2015-05-09 NOTE — ED Notes (Signed)
Seizure activity at this time

## 2015-05-09 NOTE — ED Notes (Signed)
Pt able to respond to questions at this time, oriented to person. C/o pain in stomach.

## 2015-05-09 NOTE — ED Notes (Signed)
Transported to [facility] via [employer].

## 2015-05-09 NOTE — ED Notes (Signed)
Pt actively seizing at this time, jerking, muscles contracted.

## 2015-05-09 NOTE — ED Notes (Signed)
Seizure activity, pt jerking, muscles contracted. Pt attempted to vomit, moved onto left side, suction equipment at bedside.

## 2015-05-09 NOTE — ED Notes (Signed)
Seizure activity, pt jerking, muscles contracted.

## 2015-05-09 NOTE — ED Notes (Signed)
Seizure activity, pt jerking, muscles contracting.

## 2015-05-09 NOTE — ED Notes (Signed)
Labs being collected at this time.

## 2015-05-09 NOTE — ED Notes (Addendum)
Seizure activity noted, pt jerking.

## 2015-05-09 NOTE — ED Notes (Signed)
Pt alert, able to answer questions. No urinary or bowel incontinence noted during seizure activity. No oral trauma noted.

## 2015-05-09 NOTE — ED Provider Notes (Signed)
CSN: [id number]     Arrival date & time 05/09/15  0115 History   First MD Initiated Contact with Patient 05/09/15 0121     Chief Complaint  Patient presents with  . Seizures     (Consider location/radiation/quality/duration/timing/severity/associated sxs/prior Treatment) HPI Comments: Was working tonight on break when she announced to her coworkers that she felt she was found to have a seizure.  She was assisted to the floor.  She didn't fall.  She proceeded to have a 30 second clonic tonic seizure followed by confusion.  After me today that she again had tonic-clonic movements with disturbance posturing 3  Patient is a 29 y.o. female presenting with seizures. The history is provided by medical records. The history is limited by the condition of the patient.  Seizures Seizure activity on arrival: yes   Seizure type:  Myoclonic Episode characteristics: abnormal movements   Postictal symptoms: confusion   Return to baseline: no   Timing:  Clustered Number of seizures this episode:  4 Recent head injury:  No recent head injuries PTA treatment:  None History of seizures: yes (psuudo seizures)   Current therapy:  Clonazepam   Past Medical History  Diagnosis Date  . Migraines   . Interstitial cystitis   . IUD FEB 2010  . Gastritis JULY 2011  . Depression   . 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 Fatty liver   . Obesity (BMI 30-39.9) 2011 228 LBS BMI 36.8  . Iron deficiency anemia 07/23/2010  . Irritable bowel syndrome 2012 DIARRHEA    JUN 2012 TTG IgA 14.9  . Elevated liver enzymes JUL 2011ALK PHOS 111-127 AST  143-267 ALT  213-321T BILI 0.6  ALB  3.7-4.06 Jun 2011 ALK PHOS 118 AST 24 ALT 42 T BILI 0.4 ALB 3.9  . Chronic daily headache   . Ovarian cyst   . ADD (attention deficit disorder)   . RLQ abdominal pain 07/18/2013  . Menorrhagia 07/18/2013  . Anxiety   . Potassium (K) deficiency   . Dysmenorrhea 09/18/2013  . Stress 09/18/2013  . Patient desires pregnancy  09/18/2013  . Pregnant 12/25/2013  . Blood transfusion without reported diagnosis   . Heart murmur   . Gastric bypass status for obesity   . 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 Dysrhythmia   . Sciatica of left side 11/14/2014  . Fibromyalgia   . 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 PONV (postoperative nausea and vomiting) seizure post-operatively   Past Surgical History  Procedure Laterality Date  . Gab  2007    in [place]-POUCH 5 CM  . Cholecystectomy  2005    biliary dyskinesia  . Cath self every nite      for sodium bicarb injection (discontinued 2013)  . Colonoscopy  JUN 2012 ABD PN/DIARRHEA WITH PROPOFOL    NL COLON  . Upper gastrointestinal endoscopy  JULY 2011 NAUSEA-D125,V6, PH 25    Bx; GASTRITIS, POUCH-5 CM LONG  . Dilation and curettage of uterus    . Tonsillectomy    . Gastric bypass  06/2006  . Savory dilation  06/20/2012    Dr. Barnie Alderman gastritis/Ulcer in the mid jejunum. Empiric dilation.   . Tonsillectomy and adenoidectomy    . Esophagogastroduodenoscopy    . Wisdom tooth extraction    . Repair vaginal cuff N/A 07/30/2014    Procedure: REPAIR VAGINAL CUFF;  Surgeon: Mora Bellman, MD;  Location: [facility];  Service: Gynecology;  Laterality: N/A;  . Hysteroscopy w/d&c N/A 09/12/2014    Procedure: DILATATION AND CURETTAGE /HYSTEROSCOPY;  Surgeon: Jonnie Kind, MD;  Location: [facility];  Service: Gynecology;  Laterality: N/A;  . Esophagogastroduodenoscopy (egd) with propofol N/A 04/10/2015    Procedure: ESOPHAGOGASTRODUODENOSCOPY (EGD) WITH PROPOFOL;  Surgeon: Daneil Dolin, MD;  Location: [facility];  Service: Endoscopy;  Laterality: N/A;  . Esophageal dilation N/A 04/10/2015    Procedure: ESOPHAGEAL DILATION WITH 54FR MALONEY DILATOR;  Surgeon: Daneil Dolin, MD;  Location: [facility];  Service: Endoscopy;  Laterality: N/A;  . Esophageal biopsy  04/10/2015    Procedure: BIOPSY;  Surgeon: Daneil Dolin, MD;  Location: [facility];  Service:  Endoscopy;;   Family History  Problem Relation Age of Onset  . Hemochromatosis Maternal Grandmother   . Migraines Maternal Grandmother   . Cancer Maternal Grandmother   . Breast cancer Maternal Grandmother   . Hypertension Father   . Diabetes Father   . Coronary artery disease Father   . Migraines Paternal Grandmother   . Breast cancer Paternal Grandmother   . Cancer Mother     breast  . Hemochromatosis Mother   . Breast cancer Mother   . Coronary artery disease Paternal Grandfather    History  Substance Use Topics  . Smoking status: Current Every Day Smoker -- 0.50 packs/day for 6 years    Types: Cigarettes  . Smokeless tobacco: Never Used     Comment: smokes 1 cigarette a day  . Alcohol Use: No   OB History    Gravida Para Term Preterm AB TAB SAB Ectopic Multiple Living   '3 2 1 1 1  1   2     '$ Review of Systems  Unable to perform ROS: Acuity of condition  Neurological: Positive for seizures.  All other systems reviewed and are negative.     Allergies  Gabapentin; Metoclopramide hcl; Codeine; Zofran; Latex; and Tape  Home Medications   Prior to Admission medications   Medication Sig Start Date End Date Taking? Authorizing Provider  clonazePAM (KLONOPIN) 0.5 MG tablet Take 1 tablet (0.5 mg total) by mouth 3 (three) times daily as needed for anxiety. 04/10/15  Yes Lezlie Octave Black, NP  cyclobenzaprine (FLEXERIL) 5 MG tablet TAKE 1 TABLET BY MOUTH EVERY EIGHT HOURS AS NEEDED FOR MUSCLE SPASMS. 03/17/15  Yes Kathrynn Ducking, MD  DULoxetine (CYMBALTA) 30 MG capsule Take 1-2 capsules (30-60 mg total) by mouth 2 (two) times daily. Take 1 capsule (30 mg) every morning and 2 capsules (60 mg) every night 02/24/15  Yes Albertine Patricia, MD  HYDROcodone-acetaminophen (NORCO/VICODIN) 5-325 MG per tablet Take 1 tablet by mouth every 6 (six) hours as needed. 04/24/15  Yes Butch Penny, NP  hyoscyamine (LEVSIN SL) 0.125 MG SL tablet Place 1 tablet (0.125 mg total) under the tongue  every 4 (four) hours as needed. Patient taking differently: Place 0.125 mg under the tongue every 4 (four) hours as needed for cramping.  04/17/15  Yes Orvil Feil, NP  lidocaine (LIDODERM) 5 % Place 1 patch onto the skin daily. Remove & Discard patch within 12 hours or as directed by MD Patient taking differently: Place 1 patch onto the skin daily as needed (pain). Remove & Discard patch within 12 hours or as directed by MD 04/22/15  Yes Butch Penny, NP  pantoprazole (PROTONIX) 40 MG tablet Take 1 tablet (40 mg total) by mouth 2 (two) times daily before a meal. 04/10/15  Yes  Radene Gunning, NP  promethazine (PHENERGAN) 25 MG suppository Place 1 suppository (25 mg total) rectally every 6 (six) hours as needed for nausea or vomiting. 05/05/15  Yes Nicole Pisciotta, PA-C  promethazine (PHENERGAN) 25 MG tablet Take 1 tablet (25 mg total) by mouth every 6 (six) hours as needed for nausea or vomiting. 04/15/15  Yes Noemi Chapel, MD  topiramate (TOPAMAX) 100 MG tablet Take 1 tablet (100 mg total) by mouth at bedtime. 02/18/15  Yes Kathrynn Ducking, MD  zolpidem (AMBIEN) 5 MG tablet Take 1 tablet (5 mg total) by mouth at bedtime as needed for sleep. 12/26/14  Yes Kathyrn Drown, MD  cyproheptadine (PERIACTIN) 2 MG/5ML syrup Take 10 mLs (4 mg total) by mouth 3 (three) times daily as needed (abd pain). Patient not taking: Reported on 05/09/2015 05/05/15   Elmyra Ricks Pisciotta, PA-C  ferrous sulfate 325 (65 FE) MG tablet Take 1 tablet (325 mg total) by mouth 2 (two) times daily with a meal. Patient not taking: Reported on 04/22/2015 02/06/15   Okey Regal, PA-C  sucralfate (CARAFATE) 1 GM/10ML suspension Take 10 mLs (1 g total) by mouth 4 (four) times daily -  with meals and at bedtime. Patient not taking: Reported on 04/22/2015 04/10/15   Radene Gunning, NP   BP 113/72 mmHg  Pulse 66  Resp 24  SpO2 94%  LMP 04/14/2015 Physical Exam  Constitutional: She appears well-developed and well-nourished. No distress.  HENT:   Head: Normocephalic.  Eyes: Pupils are equal, round, and reactive to light. Right pupil is reactive. Left pupil is reactive. Pupils are equal.  Pupils dilated to 5 mm, equally  Cardiovascular: Normal rate.   Pulmonary/Chest: Effort normal and breath sounds normal.  Neurological: She is unresponsive.  Skin: Skin is warm. No rash noted.  Nursing note and vitals reviewed.   ED Course  Procedures (including critical care time) Labs Review Labs Reviewed  CBC WITH DIFFERENTIAL/PLATELET - Abnormal; Notable for the following:    Hemoglobin 11.4 (*)    HCT 35.5 (*)    MCH 25.3 (*)    RDW 21.1 (*)    All other components within normal limits  I-STAT CG4 LACTIC ACID, ED - Abnormal; Notable for the following:    Lactic Acid, Venous 4.21 (*)    All other components within normal limits  CBG MONITORING, ED - Abnormal; Notable for the following:    Glucose-Capillary 102 (*)    All other components within normal limits  URINE RAPID DRUG SCREEN (HOSP PERFORMED) NOT AT [facility]  I-STAT CHEM 8, ED  CBG MONITORING, ED  I-STAT CG4 LACTIC ACID, ED    Imaging Review Ct Head Wo Contrast  05/09/2015   CLINICAL DATA:  Seizure. Multiple seizure in the emergency department.  EXAM: CT HEAD WITHOUT CONTRAST  TECHNIQUE: Contiguous axial images were obtained from the base of the skull through the vertex without intravenous contrast.  COMPARISON:  Head CT 09/03/2014, brain MRI 08/14/2014  FINDINGS: No intracranial hemorrhage, mass effect, or midline shift. No hydrocephalus. The basilar cisterns are patent. No evidence of territorial infarct. No intracranial fluid collection. Calvarium is intact. Included paranasal sinuses and mastoid air cells are well aerated.  IMPRESSION: No acute intracranial abnormality.   Electronically Signed   By: Jeb Levering M.D.   On: 05/09/2015 03:06     EKG Interpretation None     Patient proceeds to have seizures nurses count 14, despite 3 mg of Ativan.  Pupils remained dilated  at 5 to  8 mm when patient is having seizure-like activity.  She is decerebrate she has been retching but no vomiting, she is not responding to noxious stimuli. Hospital neurologist has been patient is at bedside MDM   Final diagnoses:  Seizure  Nonepileptic episode         Junius Creamer, NP 05/09/15 [place], MD 05/09/15 [phone]
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
## 2015-05-09 NOTE — ED Notes (Signed)
Patient taken to CT, Accompanied by this RN and ED EMT.

## 2015-05-09 NOTE — ED Notes (Signed)
Otter, MD at bedside.  

## 2015-05-09 NOTE — Telephone Encounter (Signed)
Patient did not come to a follow up appointment today. She is currently in the ED.

## 2015-05-09 NOTE — ED Notes (Addendum)
Seizure activity noted at this time. Pt jerking, muscles contracted.

## 2015-05-09 NOTE — ED Notes (Signed)
Pt placed on bedpan to obtain urine sample.

## 2015-05-09 NOTE — Discharge Instructions (Signed)
Nonepileptic Seizures °Nonepileptic seizures are seizures that are not caused by abnormal electrical signals in your brain. These seizures often seem like epileptic seizures, but they are not caused by epilepsy.  °There are two types of nonepileptic seizures: °· A physiologic nonepileptic seizure results from a disruption in your brain. °· A psychogenic seizure results from emotional stress. These seizures are sometimes called pseudoseizures. °CAUSES  °Causes of physiologic nonepileptic seizures include:  °· Sudden drop in blood pressure. °· Low blood sugar. °· Low levels of salt (sodium) in your blood. °· Low levels of calcium in your blood. °· Migraine. °· Heart rhythm problems. °· Sleep disorders. °· Drug and alcohol abuse. °Common causes of psychogenic nonepileptic seizures include: °· Stress. °· Emotional trauma. °· Sexual or physical abuse. °· Major life events, such as divorce or the death of a loved one. °· Mental health disorders, including panic attack and hyperactivity disorder. °SIGNS AND SYMPTOMS °A nonepileptic seizure can look like an epileptic seizure, including uncontrollable shaking (convulsions), or changes in attention, behavior, or the ability to remain awake and alert. However, there are some differences. Nonepileptic seizures usually: °· Do not cause physical injuries. °· Start slowly. °· Include crying or shrieking. °· Last longer than 2 minutes. °· Have a short recovery time without headache or exhaustion. °DIAGNOSIS  °Your health care provider can usually diagnose nonepileptic seizures after taking your medical history and giving you a physical exam. Your health care provider may want to talk to your friends or relatives who have seen you have a seizure.  °You may also need to have tests to look for causes of physiologic nonepileptic seizures. This may include an electroencephalogram (EEG), which is a test that measures electrical activity in your brain. If you have had an epileptic  seizure, the results of your EEG will be abnormal. If your health care provider thinks you have had a psychogenic nonepileptic seizure, you may need to see a mental health specialist for an evaluation. °TREATMENT  °Treatment depends on the type and cause of your seizures. °· For physiologic nonepileptic seizures, treatment is aimed at addressing the underlying condition that caused the seizures. These seizures usually stop when the underlying condition is properly treated. °· Nonepileptic seizures do not respond to the seizure medicines used to treat epilepsy. °· For psychogenic seizures, you may need to work with a mental health specialist. °HOME CARE INSTRUCTIONS °Home care will depend on the type of nonepileptic seizures you have.  °· Follow all your health care provider's instructions. °· Keep all your follow-up appointments. °SEEK MEDICAL CARE IF: °You continue to have seizures after treatment. °SEEK IMMEDIATE MEDICAL CARE IF: °· Your seizures change or become more frequent. °· You injure yourself during a seizure. °· You have one seizure after another. °· You have trouble recovering from a seizure. °· You have chest pain or trouble breathing. °MAKE SURE YOU: °· Understand these instructions. °· Will watch your condition. °· Will get help right away if you are not doing well or get worse. °Document Released: 01/07/2006 Document Revised: 04/08/2014 Document Reviewed: 09/18/2013 °[employer]® Patient Information ©2015 [employer]. This information is not intended to replace advice given to you by your health care provider. Make sure you discuss any questions you have with your health care provider. ° °

## 2015-05-09 NOTE — Progress Notes (Signed)
Pt accepted to [facility] bed 402-1 by Dr. Parke Poisson per Randall Hiss, [facility]. Admission is voluntary.  Spoke with [place] charge RN re: pt's placement.  Sharren Bridge, MSW, [place] [facility], Disposition  05/09/2015 [phone]

## 2015-05-09 NOTE — Consult Note (Signed)
Neurology Consultation Reason for Consult: Spells Referring Physician: Margaretha Seeds  CC: Spells  History is obtained from:patient  HPI: Penny [name] is a 29 y.o. 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  I reviewed this outside report which recorded two spells with generalized abnormal posturing and no EEG change.   She was on break today when she felt that one of her spells was coming on and then proceeded to have multiple episodes of what was described by the ED as tonic-clonic posturing. She had no tongue biting and no bowel or bladder incontinence. She did have an elevated lactate, though this is non-specific in this setting as it can be seen in physical exertion.   She had prolonged spells until approximately 20 minutes prior to my seeing her. She is currently back to baseline.   She has had multiple problems with abdominal pain and is awaiting evaluation at [facility]. She has had difficulty with eating solid foods.     ROS: A 14 point ROS was performed and is negative except as noted in the HPI.   Past Medical History  Diagnosis Date  . Migraines   . Interstitial cystitis   . IUD FEB 2010  . Gastritis JULY 2011  . Depression   . 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 Fatty liver   . Obesity (BMI 30-39.9) 2011 228 LBS BMI 36.8  . Iron deficiency anemia 07/23/2010  . Irritable bowel syndrome 2012 DIARRHEA    JUN 2012 TTG IgA 14.9  . Elevated liver enzymes JUL 2011ALK PHOS 111-127 AST  143-267 ALT  213-321T BILI 0.6  ALB  3.7-4.06 Jun 2011 ALK PHOS 118 AST 24 ALT 42 T BILI 0.4 ALB 3.9  . Chronic daily headache   . Ovarian cyst   . ADD (attention deficit disorder)   . RLQ abdominal pain 07/18/2013  . Menorrhagia 07/18/2013  . Anxiety   . Potassium (K) deficiency   . Dysmenorrhea 09/18/2013  . Stress 09/18/2013  . Patient desires pregnancy 09/18/2013  . Pregnant 12/25/2013  . Blood transfusion without  reported diagnosis   . Heart murmur   . Gastric bypass status for obesity   . 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 Dysrhythmia   . Sciatica of left side 11/14/2014  . Fibromyalgia   . 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 PONV (postoperative nausea and vomiting) seizure post-operatively    Family History: migraines  Social History: Tob: current smoker  Exam: Current vital signs: BP 133/78 mmHg  Pulse 76  Resp 18  SpO2 94%  LMP 04/14/2015 Vital signs in last 24 hours: Pulse Rate:  [74-106] 76 (06/03 0315) Resp:  [15-23] 18 (06/03 0315) BP: (110-145)/(72-87) 133/78 mmHg (06/03 0315) SpO2:  [94 %-100 %] 94 % (06/03 0315)  Physical Exam  Constitutional: Appears well-developed and well-nourished.  Psych: Appears anxious Eyes: No scleral injection HENT: No OP obstrucion Head: Normocephalic.  Cardiovascular: Normal rate and regular rhythm.  Respiratory: Effort normal  GI: Soft.  No distension. There is no tenderness.  Skin: WDI  Neuro: Mental Status: Patient is awake, alert, interactive and appropriate Patient is able to give a clear and coherent history. No signs of aphasia or neglect Cranial Nerves: II: Visual Fields are full. Pupils are equal, round, and reactive to light.   III,IV, VI: EOMI without ptosis or diploplia.  V: Facial sensation is symmetric to temperature  VII: Facial movement is symmetric.  VIII: hearing is intact to voice X: Uvula elevates symmetrically XI: Shoulder shrug is symmetric. XII: tongue is midline without atrophy or fasciculations.  Motor: Tone is normal. Bulk is normal. 5/5 strength was present in all four extremities. She does have some give-way weakness at the left ankle.  Sensory: Sensation is symmetric to light touch and temperature in the arms and legs with the exception of left lateral leg decreased sensation.  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 I was unfortunately unable to witness the events, but I have no convincing evidence by history or current exam to suspect that this episode was not psychogenic. I discussed the nature of psychogenic non-epileptic spells and provided a handout.   Recommendations: 1) Outpatient psychological counseling.  2) No further workup from neurological perspective needed at this time.    Roland Rack, MD [facility] [phone]  If 7pm- 7am, please page neurology on call as listed in [place].

## 2015-05-09 NOTE — ED Notes (Addendum)
Pt actively having seizure, jerking, muscles contracted

## 2015-05-09 NOTE — ED Notes (Signed)
NP Olean Ree at bedside.

## 2015-05-09 NOTE — ED Notes (Addendum)
Seizure activity at this time. Pt jerking, muscles contracted

## 2015-05-09 NOTE — ED Notes (Addendum)
Seizure activity, pt jerking, muscled contracted.

## 2015-05-10 DIAGNOSIS — F332 Major depressive disorder, recurrent severe without psychotic features: Secondary | ICD-10-CM | POA: Diagnosis present

## 2015-05-10 DIAGNOSIS — R45851 Suicidal ideations: Secondary | ICD-10-CM

## 2015-05-10 LAB — PROLACTIN: Prolactin: 80 ng/mL — ABNORMAL HIGH (ref 4.8–23.3)

## 2015-05-10 MED ORDER — TOPIRAMATE 100 MG PO TABS
100.0000 mg | ORAL_TABLET | Freq: Every day | ORAL | Status: DC
Start: 2015-05-10 — End: 2015-05-15
  Administered 2015-05-10 – 2015-05-15 (×6): 100 mg via ORAL
  Filled 2015-05-10 (×2): qty 1
  Filled 2015-05-10: qty 14
  Filled 2015-05-10 (×9): qty 1

## 2015-05-10 MED ORDER — CLONAZEPAM 0.5 MG PO TABS
0.5000 mg | ORAL_TABLET | Freq: Three times a day (TID) | ORAL | Status: DC | PRN
  Administered 2015-05-10 – 2015-05-15 (×11): 0.5 mg via ORAL
  Filled 2015-05-10 (×11): qty 1

## 2015-05-10 MED ORDER — DULOXETINE HCL 60 MG PO CPEP
60.0000 mg | ORAL_CAPSULE | Freq: Two times a day (BID) | ORAL | Status: DC
  Administered 2015-05-10 – 2015-05-12 (×4): 60 mg via ORAL
  Filled 2015-05-10 (×8): qty 1

## 2015-05-10 NOTE — Plan of Care (Signed)
Problem: Ineffective individual coping Goal: STG: Patient will remain free from self harm Outcome: Progressing Patient has not engaged in self harm and denies SI  Problem: Alteration in mood; excessive anxiety as evidenced by: Goal: STG-Pt will report an absence of self-harm thoughts/actions (Patient will report an absence of self-harm thoughts or actions)  Outcome: Progressing Denying SI

## 2015-05-10 NOTE — Progress Notes (Addendum)
Found patient resting in bed. She is flat and depressed both in affect and mood. States, "I just feel awful, physically and emotionally." Rates her depression at a 7/10, hopelessness at a 3/10 and anxiety 9/10. Rates her pain/soreness of a 7/10. Patient offered support and reassurance. Medicated per orders and scheduled flexeril given. Patient encouraged to attend programming to strengthen coping skills. Patient indicating some anxiety therefore educating on deep breathing, journaling, etc to manage it. Pain remains a 7-8/10. States she asked MD for something for pain however no new orders at this time. Will follow up with providers. Patient verbalized understanding. She denies SI/HI and remains safe. Jamie Kato

## 2015-05-10 NOTE — Progress Notes (Signed)
[facility] Notes:  (Nursing/MHT/Case Management/Adjunct)  Date:  05/10/2015  Time:  9:10 PM  Type of Therapy:  Psychoeducational Skills  Participation Level:  Active  Participation Quality:  Appropriate  Affect:  Appropriate  Cognitive:  Appropriate  Insight:  Appropriate  Engagement in Group:  Engaged  Modes of Intervention:  Discussion  Summary of Progress/Problems: Tonight in wrap up group Martiza stated that it has "been a day" she said she had seen the doctor and was able to talk to him and create some goals. Her coping skills that she would use outside of the hospital was talking to someone, interacting with her children and going for walks. [name] 05/10/2015, 9:10 PM
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
## 2015-05-10 NOTE — [facility] Group Notes (Signed)
Pt did not participate in the Wrap up group.

## 2015-05-10 NOTE — Progress Notes (Signed)
Spoke with patient who is anxious, tearful and verbalizes frustration. Patient unclear as to why her home meds were not adjusted as this is the reason for her admit. "This is the weekend with my daughter and if my meds aren't going to be changed when I've indicated they aren't working, then it's a waste of time. Why am I here?" "The doctor said he was going to change some things but he didn't." Patient offered support and reassurance. Encouraged her to speak with provider tomorrow to voice concerns. Informed her that her morning dose of cymbalta has been increased from 30mg -60mg . Patient requested and was given 72 hour RFD form which she completed at 1730. Will continue to monitor. Patient refusing dinner as she states, "I just don't feel like it." Jamie Kato

## 2015-05-10 NOTE — B[facility] Suicide Risk Assessment (Signed)
[facility] Admission Suicide Risk Assessment   Nursing information obtained from:    Demographic factors:    Current Mental Status:    Loss Factors:    Historical Factors:    Risk Reduction Factors:    Total Time spent with patient: 30 minutes Principal Problem: Major depressive disorder, recurrent episode, severe with peripartum onset Diagnosis:   Patient Active Problem List   Diagnosis Date Noted  . Major depressive disorder, recurrent episode, severe with peripartum onset [F33.2] 05/10/2015  . Post partum depression [F53] 05/09/2015  . Anastomotic ulcer [K28.9]   . Dysphagia [R13.10]   . Hematemesis [K92.0] 04/07/2015  . Intractable vomiting with nausea [R11.10] 04/07/2015  . Elevated liver enzymes [R74.8]   . Epigastric pain [R10.13]   . Abdominal pain [R10.9]   . Microcytic anemia [D50.9]   . 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 Seizure-like activity [R56.9] 02/22/2015  . Fibromyalgia [M79.7] 02/18/2015  . Vulvar fissure [N90.89] 02/18/2015  . Current smoker [Z72.0] 01/29/2015  . Hereditary and idiopathic peripheral neuropathy [G60.9] 01/15/2015  . Leg weakness, bilateral [R29.898] 01/02/2015  . Gait disorder [R26.9] 01/02/2015  . Cervicalgia [M54.2] 12/18/2014  . Sciatica of left side [M54.32] 11/14/2014  . 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 Abdominal pain, right lower quadrant [R10.31] 08/22/2014  . Headache, migraine [G43.909] 08/19/2014  . Seizure [R56.9] 08/18/2014  . Encephalopathy [G93.40] 08/13/2014  . Headache [R51] 08/13/2014  . Altered mental status [R41.82] 08/13/2014  . Right leg numbness [R20.8] 07/31/2014  . Previous gastric bypass affecting pregnancy, antepartum [O99.840] 05/22/2014  . 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 Depression complicating pregnancy in second trimester, antepartum [J50.093, F32.9] 05/09/2014  . Rapid palpitations [R00.2] 02/18/2014  . H/O maternal third degree perineal laceration, currently pregnant  [O09.299] 02/18/2014  . Maternal iron deficiency anemia complicating pregnancy [G18.299, D50.9] 01/16/2014  . Restless legs [G25.81] 01/16/2014  . Chronic interstitial cystitis [N30.10] 10/23/2013  . Menorrhagia [N92.0] 07/18/2013  . h/o Opiate addiction [F11.20] 03/11/2012    Class: Acute  . Passive suicidal ideations [R45.851] 03/10/2012    Class: Acute  . History of migraine headaches [Z86.69] 03/10/2012    Class: Acute  . Depression with anxiety [F41.8] 03/10/2012    Class: Chronic  . 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 Pelvic congestion syndrome [N94.89] 10/13/2011  . Coitalgia [N94.1] 10/13/2011  . Chronic migraine without aura [G43.709] 10/13/2011  . IBS (irritable bowel syndrome) [K58.9] 08/25/2011  . Diarrhea [R19.7] 05/27/2011  . OBESITY, UNSPECIFIED [E66.9] 09/17/2010  . LIVER FUNCTION TESTS, ABNORMAL, HX OF [Z86.39] 09/17/2010  . Iron deficiency anemia [D50.9] 07/23/2010     Continued Clinical Symptoms:  Alcohol Use Disorder Identification Test Final Score (AUDIT): 0 The "Alcohol Use Disorders Identification Test", Guidelines for Use in Primary Care, Second Edition.  World Pharmacologist [facility]). Score between 0-7:  no or low risk or alcohol related problems. Score between 8-15:  moderate risk of alcohol related problems. Score between 16-19:  high risk of alcohol related problems. Score 20 or above:  warrants further diagnostic evaluation for alcohol dependence and treatment.   CLINICAL FACTORS:   Severe Anxiety and/or Agitation Depression:   Anhedonia Impulsivity Insomnia Recent sense of peace/wellbeing Severe Postpartum Depression Personality Disorders:   Comorbid depression Chronic Pain Unstable or Poor Therapeutic Relationship Previous Psychiatric Diagnoses and Treatments Medical Diagnoses  and Treatments/Surgeries  Psychiatric Specialty Exam: Physical Exam  ROS  Blood pressure 91/66, pulse 100, temperature 97.6 F (36.4 C), temperature source Oral, resp. rate 16, height 5\' 6"  (1.676 m), weight 104.327 kg (230 lb), last menstrual period 05/02/2015, SpO2 100 %, not currently breastfeeding.Body mass index is 37.14 kg/(m^2).    COGNITIVE FEATURES THAT CONTRIBUTE TO RISK:  Closed-mindedness, Loss of executive function, Polarized thinking and Thought constriction (tunnel vision)    SUICIDE RISK:   Moderate:  Frequent suicidal ideation with limited intensity, and duration, some specificity in terms of plans, no associated intent, good self-control, limited dysphoria/symptomatology, some risk factors present, and identifiable protective factors, including available and accessible social support.  PLAN OF CARE: Admit to [facility] for crisis stabilization, safety monitoring her medication management of major depressive disorder, anxiety patient also to see his medication management for abdominal spasms and pain.  Medical Decision Making:  New problem, with additional work up planned, Review of Psycho-Social Stressors (1), Review or order clinical lab tests (1), Established Problem, Worsening (2), Review or order medicine tests (1), Review of Medication Regimen & Side Effects (2) and Review of New Medication or Change in Dosage (2)  I certify that inpatient services furnished can reasonably be expected to improve the patient's condition.   Avyonna Wagoner,JANARDHAHA R. 05/10/2015, 2:12 PM

## 2015-05-10 NOTE — [facility] Group Notes (Signed)
[facility] Group Notes:  (Nursing/MHT/Case Management/Adjunct)  Date:  05/10/2015  Time:  1000  Type of Therapy:  Nurse Education  Participation Level:  Active  Participation Quality:  Attentive  Affect:  Blunted and Flat  Cognitive:  Alert  Insight:  Improving  Engagement in Group:  Engaged  Modes of Intervention:  Discussion, Education and Support  Summary of Progress/Problems: Discussed healthy coping skills and choices. Reviewed importance of healthy self esteem as well as healthy relationships vs toxic relationships.    Penny [name] [facility] 05/10/2015, 11:28 AM
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
## 2015-05-10 NOTE — Progress Notes (Signed)
Patient has been up in the dayroom a little bit more this evening. She is concerned that she will lose her job if she is not discharged before Monday. Writer explained that she will need to talk with her doctor expressing her concerns. She is compliant with her medications and attended group this evening. She denies si/hi/a/v hallucinations. She is less isolative today. Support and encouragement given, safety maintained on unit with 15 min checks.

## 2015-05-10 NOTE — BHH Group Notes (Signed)
[facility] Group Notes:  (Clinical Social Work)  05/10/2015     1:15-2:15PM  Summary of Progress/Problems:   The main focus of today's process group was to learn how to use a decisional balance exercise to move forward in the Stages of Change, which were described and discussed.  Motivational Interviewing and a worksheet were utilized to help patients explore in depth the perceived benefits and costs of unhealthy coping techniques, as well as the  benefits and costs of replacing that with a healthy coping skills.   The patient expressed that the reason she is in the hospital is major depression and anxiety and to gain control over her seizures.  One healthy coping skill she needs to develop is learning to ask for help, realizing she cannot do all this on her own.  One barrier is that she may simply keep doing what she has always done, be strong and not ask for help.  Group discussed possible helps with that barrier.  Type of Therapy:  Group Therapy - Process   Participation Level:  Active  Participation Quality:  Appropriate, Attentive, Sharing and Supportive  Affect:  Blunted, Depressed and Irritable  Cognitive:  Alert and Appropriate  Insight:  Engaged  Engagement in Therapy:  Engaged  Modes of Intervention:  Education, Motivational Interviewing  Selmer Dominion, LCSW 05/10/2015, 3:50 PM

## 2015-05-10 NOTE — Progress Notes (Signed)
Writer entered patients room and observed her lying in bed asleep. She was admitted late afternoon and when writer took her 2000 medications she answered briefly and took her scheduled medication. She denies si/hi/a/v hallucinations. Writer left room so that she could rest. Safety maintained on unit with 15 min checks.

## 2015-05-11 MED ORDER — TRAZODONE HCL 100 MG PO TABS
100.0000 mg | ORAL_TABLET | Freq: Every evening | ORAL | Status: DC | PRN
  Administered 2015-05-11: 100 mg via ORAL
  Filled 2015-05-11: qty 1

## 2015-05-11 MED ORDER — HYDROXYZINE HCL 25 MG PO TABS
ORAL_TABLET | ORAL | Status: AC
  Filled 2015-05-11: qty 1

## 2015-05-11 MED ORDER — HYDROXYZINE HCL 25 MG PO TABS
25.0000 mg | ORAL_TABLET | Freq: Four times a day (QID) | ORAL | Status: DC | PRN
  Administered 2015-05-11 – 2015-05-14 (×5): 25 mg via ORAL
  Filled 2015-05-11 (×4): qty 1
  Filled 2015-05-11: qty 20

## 2015-05-11 NOTE — Plan of Care (Signed)
Problem: Ineffective individual coping Goal: STG: Patient will remain free from self harm Outcome: Progressing Patient has remained free from self harm and denies suicidal ideations.

## 2015-05-11 NOTE — Progress Notes (Signed)
D: Per pt self inventory form pt reports she slept poor. She reports a fair appetite with low energy level. She reports her depression 7/10, hopelessness 6/10 and anxiety 9/10.  She reports physical pain in her back 8/10.  She voices a strong desire to be discharged. Behavior calm on approach, but tearful during conversation. "I just do not feel good."   A: Klonopin administered for anxiety, prescribed medication regimen administered (see eMAR). Special checks q 15 mins in place for safety. Encouragement and counseling provided. Encouraged to get OOB.    R. Safety maintained. Compliant with medication regimen. Reports no relief from Klonopin. Reported to MD. Vistaril 25mg  PRN Q 6 hour ordered. Remains in bed, despite encouragement.

## 2015-05-11 NOTE — Progress Notes (Signed)
[facility] MD Progress Note  05/11/2015 3:25 PM Penny [name]  MRN:  [id number] Subjective:  Patient seen today for face-to-face psych evaluation today patient reported continued to have depression anxiety and suicidal ideation. Patient reportedly suffering with insomnia and requested medication adjustment today. Patient has no pseudoseizures since he was admitted to behavioral health [facility]. Patient has been compliant with her medication and reportedly no adverse affects.  Patient stated that her medication for depression is not working but at the same time does not endorse current suicidal or homicidal ideation or psychotic symptoms.  Principal Problem: Major depressive disorder, recurrent episode, severe with peripartum onset Diagnosis:   Patient Active Problem List   Diagnosis Date Noted  . Major depressive disorder, recurrent episode, severe with peripartum onset [F33.2] 05/10/2015  . Post partum depression [F53] 05/09/2015  . Anastomotic ulcer [K28.9]   . Dysphagia [R13.10]   . Hematemesis [K92.0] 04/07/2015  . Intractable vomiting with nausea [R11.10] 04/07/2015  . Elevated liver enzymes [R74.8]   . Epigastric pain [R10.13]   . Abdominal pain [R10.9]   . Microcytic anemia [D50.9]   . 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 Seizure-like activity [R56.9] 02/22/2015  . Fibromyalgia [M79.7] 02/18/2015  . Vulvar fissure [N90.89] 02/18/2015  . Current smoker [Z72.0] 01/29/2015  . Hereditary and idiopathic peripheral neuropathy [G60.9] 01/15/2015  . Leg weakness, bilateral [R29.898] 01/02/2015  . Gait disorder [R26.9] 01/02/2015  . Cervicalgia [M54.2] 12/18/2014  . Sciatica of left side [M54.32] 11/14/2014  . 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 Abdominal pain, right lower quadrant [R10.31] 08/22/2014  . Headache, migraine [G43.909] 08/19/2014  . Seizure [R56.9] 08/18/2014  . Encephalopathy [G93.40] 08/13/2014  . Headache [R51] 08/13/2014  . Altered mental status [R41.82] 08/13/2014   . Right leg numbness [R20.8] 07/31/2014  . Previous gastric bypass affecting pregnancy, antepartum [O99.840] 05/22/2014  . 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 Depression complicating pregnancy in second trimester, antepartum [J62.836, F32.9] 05/09/2014  . Rapid palpitations [R00.2] 02/18/2014  . H/O maternal third degree perineal laceration, currently pregnant [O09.299] 02/18/2014  . Maternal iron deficiency anemia complicating pregnancy [O29.476, D50.9] 01/16/2014  . Restless legs [G25.81] 01/16/2014  . Chronic interstitial cystitis [N30.10] 10/23/2013  . Menorrhagia [N92.0] 07/18/2013  . h/o Opiate addiction [F11.20] 03/11/2012    Class: Acute  . Passive suicidal ideations [R45.851] 03/10/2012    Class: Acute  . History of migraine headaches [Z86.69] 03/10/2012    Class: Acute  . Depression with anxiety [F41.8] 03/10/2012    Class: Chronic  . 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 Pelvic congestion syndrome [N94.89] 10/13/2011  . Coitalgia [N94.1] 10/13/2011  . Chronic migraine without aura [G43.709] 10/13/2011  . IBS (irritable bowel syndrome) [K58.9] 08/25/2011  . Diarrhea [R19.7] 05/27/2011  . OBESITY, UNSPECIFIED [E66.9] 09/17/2010  . LIVER FUNCTION TESTS, ABNORMAL, HX OF [Z86.39] 09/17/2010  . Iron deficiency anemia [D50.9] 07/23/2010   Total Time spent with patient: 30 minutes   Past Medical History:  Past Medical History  Diagnosis Date  . Migraines   . Interstitial cystitis   . IUD FEB 2010  . Gastritis JULY 2011  . Depression   . 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 Fatty liver   . Obesity (BMI 30-39.9) 2011 228 LBS BMI 36.8  . Iron deficiency anemia 07/23/2010  . Irritable bowel  syndrome 2012 DIARRHEA    JUN 2012 TTG IgA 14.9  . Elevated liver enzymes JUL 2011ALK  PHOS 111-127 AST  143-267 ALT  213-321T BILI 0.6  ALB  3.7-4.06 Jun 2011 ALK PHOS 118 AST 24 ALT 42 T BILI 0.4 ALB 3.9  . Chronic daily headache   . Ovarian cyst   . ADD (attention deficit disorder)   . RLQ abdominal pain 07/18/2013  . Menorrhagia 07/18/2013  . Anxiety   . Potassium (K) deficiency   . Dysmenorrhea 09/18/2013  . Stress 09/18/2013  . Patient desires pregnancy 09/18/2013  . Pregnant 12/25/2013  . Blood transfusion without reported diagnosis   . Heart murmur   . Gastric bypass status for obesity   . 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 Dysrhythmia   . Sciatica of left side 11/14/2014  . Fibromyalgia   . 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 PONV (postoperative nausea and vomiting) seizure post-operatively    Past Surgical History  Procedure Laterality Date  . Gab  2007    in [place]-POUCH 5 CM  . Cholecystectomy  2005    biliary dyskinesia  . Cath self every nite      for sodium bicarb injection (discontinued 2013)  . Colonoscopy  JUN 2012 ABD PN/DIARRHEA WITH PROPOFOL    NL COLON  . Upper gastrointestinal endoscopy  JULY 2011 NAUSEA-D125,V6, PH 25    Bx; GASTRITIS, POUCH-5 CM LONG  . Dilation and curettage of uterus    . Tonsillectomy    . Gastric bypass  06/2006  . Savory dilation  06/20/2012    Dr. Barnie Alderman gastritis/Ulcer in the mid jejunum. Empiric dilation.   . Tonsillectomy and adenoidectomy    . Esophagogastroduodenoscopy    . Wisdom tooth extraction    . Repair vaginal cuff N/A 07/30/2014    Procedure: REPAIR VAGINAL CUFF;  Surgeon: Mora Bellman, MD;  Location: [facility];  Service: Gynecology;  Laterality: N/A;  . Hysteroscopy w/d&c N/A 09/12/2014    Procedure: DILATATION AND CURETTAGE /HYSTEROSCOPY;  Surgeon: Jonnie Kind, MD;  Location: [facility];  Service: Gynecology;  Laterality: N/A;  . Esophagogastroduodenoscopy (egd) with propofol N/A 04/10/2015    Procedure: ESOPHAGOGASTRODUODENOSCOPY (EGD) WITH PROPOFOL;   Surgeon: Daneil Dolin, MD;  Location: [facility];  Service: Endoscopy;  Laterality: N/A;  . Esophageal dilation N/A 04/10/2015    Procedure: ESOPHAGEAL DILATION WITH 54FR MALONEY DILATOR;  Surgeon: Daneil Dolin, MD;  Location: [facility];  Service: Endoscopy;  Laterality: N/A;  . Esophageal biopsy  04/10/2015    Procedure: BIOPSY;  Surgeon: Daneil Dolin, MD;  Location: [facility];  Service: Endoscopy;;   Family History:  Family History  Problem Relation Age of Onset  . Hemochromatosis Maternal Grandmother   . Migraines Maternal Grandmother   . Cancer Maternal Grandmother   . Breast cancer Maternal Grandmother   . Hypertension Father   . Diabetes Father   . Coronary artery disease Father   . Migraines Paternal Grandmother   . Breast cancer Paternal Grandmother   . Cancer Mother     breast  . Hemochromatosis Mother   . Breast cancer Mother   . Coronary artery disease Paternal Grandfather    Social History:  History  Alcohol Use No     History  Drug Use No    History   Social History  . Marital Status: Single    Spouse Name: N/A  . Number of Children: 2  .  Years of Education: some colle   Occupational History  .     Social History Main Topics  . Smoking status: Current Every Day Smoker -- 0.50 packs/day for 6 years    Types: Cigarettes  . Smokeless tobacco: Never Used     Comment: smokes 1 cigarette a day  . Alcohol Use: No  . Drug Use: No  . Sexual Activity: Not Currently    Birth Control/ Protection: None   Other Topics Concern  . None   Social History Narrative   Patient is right handed.   Patient drinks one cup of coffee daily.   Additional History:    Sleep: Poor  Appetite:  Fair   Assessment:   Musculoskeletal: Strength & Muscle Tone: decreased Gait & Station: normal Patient leans: N/A   Psychiatric Specialty Exam: Physical Exam  ROS  Blood pressure 114/75, pulse 86, temperature 97.4 F (36.3 C), temperature source Oral, resp. rate 16, height 5'  6" (1.676 m), weight 104.327 kg (230 lb), last menstrual period 05/02/2015, SpO2 100 %, not currently breastfeeding.Body mass index is 37.14 kg/(m^2).  General Appearance: Disheveled and Guarded  Eye Contact::  Good  Speech:  Clear and Coherent  Volume:  Decreased  Mood:  Anxious, Depressed and Hopeless  Affect:  Appropriate and Congruent  Thought Process:  Intact  Orientation:  Full (Time, Place, and Person)  Thought Content:  WDL  Suicidal Thoughts:  No  Homicidal Thoughts:  No  Memory:  Immediate;   Fair Recent;   Fair  Judgement:  Intact  Insight:  Fair  Psychomotor Activity:  Decreased  Concentration:  Good  Recall:  Good  Fund of Knowledge:Good  Language: Good  Akathisia:  Negative  Handed:  Right  AIMS (if indicated):     Assets:  Communication Skills Desire for Improvement Financial Resources/Insurance Housing Intimacy Leisure Time Physical Health Resilience Social Support Talents/Skills Transportation Vocational/Educational  ADL's:  Intact  Cognition: WNL  Sleep:  Number of Hours: 5.75     Current Medications: Current Facility-Administered Medications  Medication Dose Route Frequency Provider Last Rate Last Dose  . acetaminophen (TYLENOL) tablet 650 mg  650 mg Oral Q6H PRN Encarnacion Slates, NP   650 mg at 05/10/15 1253  . alum & mag hydroxide-simeth (MAALOX/MYLANTA) 200-200-20 MG/5ML suspension 30 mL  30 mL Oral Q4H PRN Encarnacion Slates, NP      . clonazePAM Bobbye Charleston) tablet 0.5 mg  0.5 mg Oral TID PRN Ambrose Finland, MD   0.5 mg at 05/11/15 0826  . cyclobenzaprine (FLEXERIL) tablet 10 mg  10 mg Oral BID Encarnacion Slates, NP   10 mg at 05/11/15 0824  . DULoxetine (CYMBALTA) DR capsule 60 mg  60 mg Oral BID Ambrose Finland, MD   60 mg at 05/11/15 0824  . ferrous sulfate tablet 325 mg  325 mg Oral BID WC Encarnacion Slates, NP   325 mg at 05/11/15 0825  . hydrOXYzine (ATARAX/VISTARIL) tablet 25 mg  25 mg Oral Q6H PRN Ambrose Finland, MD   25 mg at  05/11/15 1143  . hydrOXYzine (ATARAX/VISTARIL) tablet 50 mg  50 mg Oral Once Niel Hummer, NP   50 mg at 05/09/15 1753  . lidocaine (LIDODERM) 5 % 1 patch  1 patch Transdermal Q24H Encarnacion Slates, NP   1 patch at 05/10/15 2128  . magnesium hydroxide (MILK OF MAGNESIA) suspension 30 mL  30 mL Oral Daily PRN Encarnacion Slates, NP      . nicotine (  NICODERM CQ - dosed in mg/24 hours) patch 21 mg  21 mg Transdermal Daily Niel Hummer, NP   21 mg at 05/10/15 0858  . pantoprazole (PROTONIX) EC tablet 40 mg  40 mg Oral BID AC Encarnacion Slates, NP   40 mg at 05/11/15 7493  . topiramate (TOPAMAX) tablet 100 mg  100 mg Oral Daily Ambrose Finland, MD   100 mg at 05/11/15 5521    Lab Results: No results found for this or any previous visit (from the past 48 hour(s)).  Physical Findings: AIMS:  , ,  ,  ,    CIWA:    COWS:     Treatment Plan Summary: Daily contact with patient to assess and evaluate symptoms and progress in treatment and Medication management  Treatment Plan/Recommendations:   1. Admit for crisis management and stabilization. 2. Medication management to reduce current symptoms to base line and improve the patient's overall level of functioning. Cymbalta 60 mg twice daily for depression Hydroxyzine 25 mg 3 times daily as needed for anxiety  We will continue Topamax 100 mg daily for seizures as of neurology  NicoDerm CQ 21 mg once daily for cravings  Lidocaine patch daily for chronic pains  Flexeril 10 mg twice daily for spasms  3. Treat health problems as indicated. 4. Develop treatment plan to decrease risk of relapse upon discharge and to reduce the need for readmission. 5. Psycho-social education regarding relapse prevention and self care. 6. Health care follow up as needed for medical problems. 7. Restart home medications where appropriate.   Medical Decision Making:  Review of Psycho-Social Stressors (1), Review or order clinical lab tests (1), Established Problem, Worsening  (2), Review or order medicine tests (1), Review of Medication Regimen & Side Effects (2) and Review of New Medication or Change in Dosage (2)     Kayia Billinger,JANARDHAHA R. 05/11/2015, 3:25 PM

## 2015-05-11 NOTE — Progress Notes (Signed)
[facility] Group Notes:  (Nursing/MHT/Case Management/Adjunct)  Date:  05/11/2015  Time:  9:29 PM  Type of Therapy:  Psychoeducational Skills  Participation Level:  Minimal  Participation Quality:  Appropriate  Affect:  Appropriate  Cognitive:  Appropriate  Insight:  Appropriate  Engagement in Group:  Limited  Modes of Intervention:  Discussion  Summary of Progress/Problems: Penny [name] had limited engagement in the group. She said that for majority of the day she stayed in her bed because she did not have a good attitude and it was better for her to just stay to herself. She mentioned that her mom is her support system and that she stays in the area so that was helpful.   [name] 05/11/2015, 9:29 PM

## 2015-05-11 NOTE — BHH Group Notes (Signed)
[facility] Group Notes: (Clinical Social Work)   05/11/2015      Type of Therapy:  Group Therapy   Participation Level:  Did Not Attend despite MHT prompting   Selmer Dominion, LCSW 05/11/2015, 3:41 PM

## 2015-05-12 MED ORDER — DULOXETINE HCL 60 MG PO CPEP
60.0000 mg | ORAL_CAPSULE | Freq: Every day | ORAL | Status: DC
  Administered 2015-05-13 – 2015-05-15 (×3): 60 mg via ORAL
  Filled 2015-05-12 (×3): qty 1
  Filled 2015-05-12: qty 14
  Filled 2015-05-12 (×4): qty 1

## 2015-05-12 MED ORDER — TRAZODONE HCL 150 MG PO TABS
150.0000 mg | ORAL_TABLET | Freq: Every evening | ORAL | Status: DC | PRN
  Administered 2015-05-12 – 2015-05-14 (×3): 150 mg via ORAL
  Filled 2015-05-12: qty 14
  Filled 2015-05-12 (×4): qty 1

## 2015-05-12 MED ORDER — CARBAMAZEPINE 200 MG PO TABS
200.0000 mg | ORAL_TABLET | Freq: Two times a day (BID) | ORAL | Status: DC
Start: 2015-05-12 — End: 2015-05-15
  Administered 2015-05-12 – 2015-05-15 (×6): 200 mg via ORAL
  Filled 2015-05-12: qty 1
  Filled 2015-05-12: qty 60
  Filled 2015-05-12 (×3): qty 1
  Filled 2015-05-12: qty 60
  Filled 2015-05-12 (×8): qty 1

## 2015-05-12 NOTE — Progress Notes (Signed)
NSG shift assessment. 7a-7p.   D: Affect blunted, mood depressed, behavior appropriate. Attends groups and participates. Cooperative with staff and is getting along well with peers. On her Self Inventory she rate her depression as 5 out of 10 with feelings of hopelessness as 4 out of 10.  Her anxiety is 6 out of 10. 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  She has complained of feeling dizzy and BP recheck was wnl. Since finding out that she is not being discharged today she is sullen and staying in bed.  A: Observed pt interacting in group and in the milieu: Support and encouragement offered. Safety maintained with observations every 15 minutes.   R:   Contracts for safety and continues to follow the treatment plan, working on learning new coping skills.

## 2015-05-12 NOTE — Clinical Social Work Note (Signed)
Per patient request, CSW advised patient's supervisor, Theophilus Kinds, [phone], of patient admitting to the hospital.  [name] stated she would need to advise HR as they were planning to discuss patient's situation.

## 2015-05-12 NOTE — BHH Suicide Risk Assessment (Signed)
[facility] INPATIENT:  Family/Significant Other Suicide Prevention Education  Suicide Prevention Education:  Education Completed; [name], Mother, [phone];  has been identified by the patient as the family member/significant other with whom the patient will be residing, and identified as the person(s) who will aid the patient in the event of a mental health crisis (suicidal ideations/suicide attempt).  With written consent from the patient, the family member/significant other has been provided the following suicide prevention education, prior to the and/or following the discharge of the patient.  The suicide prevention education provided includes the following:  Suicide risk factors  Suicide prevention and interventions  National Suicide Hotline telephone number  [facility] assessment telephone number  [facility] Emergency Assistance [place] and/or Residential Mobile Crisis Unit telephone number  Request made of family/significant other to:  Remove weapons (e.g., guns, rifles, knives), all items previously/currently identified as safety concern.   Mother advised patient does not have access to weapons.    Remove drugs/medications (over-the-counter, prescriptions, illicit drugs), all items previously/currently identified as a safety concern.  The family member/significant other verbalizes understanding of the suicide prevention education information provided.  The family member/significant other agrees to remove the items of safety concern listed above.  Concha Pyo 05/12/2015   3:58 PM

## 2015-05-12 NOTE — Telephone Encounter (Signed)
error 
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
## 2015-05-12 NOTE — Clinical Social Work Note (Signed)
CSW spoke with mother who reports she has had a serious history of depression for over 20 years.  She shared she is very familiar with depression and SI and does not believe patient attempted suicide.  She stated she is not a mother in denial.  She shared patient has pseudo seizures and if not seen on EKG, you would not know what was going on with her.  Mother advised patient has had a very difficult year with birth and post delivery of her baby.  Mother advised no one seems to listen and feels patient is seeking painkillers.  She advised patient begin having these seizure following the birth of her daughter.

## 2015-05-12 NOTE — BHH Group Notes (Signed)
[facility] LCSW Group Therapy  Overcoming Obstacles 1:15 - 2:30 PM  05/12/2015 2:31 PM  Type of Therapy:  Group Therapy  Participation Level:  Did Not Attend - Patient was invited to group.  Concha Pyo 05/12/2015, 2:31 PM

## 2015-05-12 NOTE — Progress Notes (Signed)
[facility] Group Notes:  (Nursing/MHT/Case Management/Adjunct)  Date:  05/12/2015  Time:  8:55 PM  Type of Therapy:  Psychoeducational Skills  Participation Level:  Active  Participation Quality:  Appropriate  Affect:  Appropriate  Cognitive:  Appropriate  Insight:  Appropriate  Engagement in Group:  Engaged  Modes of Intervention:  Discussion  Summary of Progress/Problems: Tonight in wrap up group Penny [name] said that today was a good day for her because her mom and best friend were able to come and visit her. She said that once she is discharged she is going to look into some churches in her home town as a way to improve her wellness. [name] 05/12/2015, 8:55 PM

## 2015-05-12 NOTE — Progress Notes (Signed)
D: Patient appears sad and depressed. Cooperative with assessment. Pt. reports she is not getting the help she needs and does not want to be here any longer. Pt reports she has not been sleeping well. Pt denies SI/HI/AV and contract for safety at this time. Offered no additional question or concerns  A: Met with pt 1:1. Medications administered as prescribed. Supported and encouragement provided and to attend groups. Pt encourage to discuss feelings with treatment team tomorrow. R: Patient remains safe. She is complaint with medication. Pt denies SI/HI/AVH.

## 2015-05-12 NOTE — Plan of Care (Signed)
Problem: Alteration in mood Goal: STG-Patient is able to discuss feelings and issues (Patient is able to discuss feelings and issues leading to depression)  Outcome: Progressing Pt expressed her feelings about not getting the help she needed. Pt encouraged to discuss feelings with treatment team tomorrow. Medication given for insomnia. Pt sleeping.

## 2015-05-12 NOTE — Progress Notes (Signed)
Recreation Therapy Notes  Date: 06.06.16 Time: 9:30 am Location: [address] Group Room  Group Topic: Coping Skills  Goal Area(s) Addresses:  Patient will verbalize importance of using healthy stress management. Patient will identify positive emotions associated with healthy stress management.  Intervention: Stress Management  Activity: Healthy Relaxation Guided Imagery.  LRT introduced and educated patients on stress management technique of guided imagery.  Script was used to deliver the technique to patients.  Patients were asked to follow script read aloud by LRT to engage in practicing the stress management technique.   Education: Radiographer, therapeutic, Dentist.   Education Outcome: Acknowledges understanding/In group clarification offered/Needs additional education.   Clinical Observations/Feedback: Patient did not attend group.    Victorino Sparrow, LRT/CTRS         Ria Comment, Dorris Pierre A 05/12/2015 3:30 PM
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
## 2015-05-13 ENCOUNTER — Encounter (HOSPITAL_COMMUNITY): Payer: Self-pay | Admitting: *Deleted

## 2015-05-13 ENCOUNTER — Telehealth: Payer: Self-pay

## 2015-05-13 ENCOUNTER — Other Ambulatory Visit (HOSPITAL_COMMUNITY): Payer: Self-pay

## 2015-05-13 DIAGNOSIS — F332 Major depressive disorder, recurrent severe without psychotic features: Principal | ICD-10-CM

## 2015-05-13 LAB — CBC WITH DIFFERENTIAL/PLATELET
BASOS ABS: 0 10*3/uL (ref 0.0–0.1)
BASOS PCT: 0 % (ref 0–1)
Basophils Absolute: 0 10*3/uL (ref 0.0–0.1)
Basophils Relative: 0 % (ref 0–1)
EOS ABS: 0.1 10*3/uL (ref 0.0–0.7)
EOS PCT: 2 % (ref 0–5)
Eosinophils Absolute: 0.2 10*3/uL (ref 0.0–0.7)
Eosinophils Relative: 3 % (ref 0–5)
HCT: 33.7 % — ABNORMAL LOW (ref 36.0–46.0)
HEMATOCRIT: 29.8 % — AB (ref 36.0–46.0)
Hemoglobin: 9.2 g/dL — ABNORMAL LOW (ref 12.0–15.0)
Hemoglobin: 10.6 g/dL — ABNORMAL LOW (ref 12.0–15.0)
LYMPHS PCT: 41 % (ref 12–46)
LYMPHS PCT: 39 % (ref 12–46)
Lymphs Abs: 2.2 10*3/uL (ref 0.7–4.0)
Lymphs Abs: 2.4 10*3/uL (ref 0.7–4.0)
MCH: 24.3 pg — AB (ref 26.0–34.0)
MCH: 25.2 pg — ABNORMAL LOW (ref 26.0–34.0)
MCHC: 30.9 g/dL (ref 30.0–36.0)
MCHC: 31.5 g/dL (ref 30.0–36.0)
MCV: 78.6 fL (ref 78.0–100.0)
MCV: 80 fL (ref 78.0–100.0)
MONOS PCT: 9 % (ref 3–12)
Monocytes Absolute: 0.5 10*3/uL (ref 0.1–1.0)
Monocytes Absolute: 0.9 10*3/uL (ref 0.1–1.0)
Monocytes Relative: 14 % — ABNORMAL HIGH (ref 3–12)
NEUTROS PCT: 47 % (ref 43–77)
Neutro Abs: 2.4 10*3/uL (ref 1.7–7.7)
Neutro Abs: 2.7 10*3/uL (ref 1.7–7.7)
Neutrophils Relative %: 45 % (ref 43–77)
Platelets: 272 10*3/uL (ref 150–400)
Platelets: 190 10*3/uL (ref 150–400)
RBC: 3.79 MIL/uL — ABNORMAL LOW (ref 3.87–5.11)
RBC: 4.21 MIL/uL (ref 3.87–5.11)
RDW: 20.1 % — ABNORMAL HIGH (ref 11.5–15.5)
RDW: 21.1 % — ABNORMAL HIGH (ref 11.5–15.5)
WBC: 5.3 10*3/uL (ref 4.0–10.5)
WBC: 6.1 10*3/uL (ref 4.0–10.5)

## 2015-05-13 LAB — COMPREHENSIVE METABOLIC PANEL
ALBUMIN: 4.1 g/dL (ref 3.5–5.0)
ALT: 46 U/L (ref 14–54)
ANION GAP: 8 (ref 5–15)
AST: 27 U/L (ref 15–41)
Alkaline Phosphatase: 94 U/L (ref 38–126)
BILIRUBIN TOTAL: 0.6 mg/dL (ref 0.3–1.2)
BUN: 13 mg/dL (ref 6–20)
CO2: 23 mmol/L (ref 22–32)
Calcium: 9.4 mg/dL (ref 8.9–10.3)
Chloride: 110 mmol/L (ref 101–111)
Creatinine, Ser: 0.73 mg/dL (ref 0.44–1.00)
GFR calc Af Amer: >60 mL/min (ref >60)
GLUCOSE: 93 mg/dL (ref 65–99)
POTASSIUM: 4.1 mmol/L (ref 3.5–5.1)
Sodium: 141 mmol/L (ref 135–145)
Total Protein: 7 g/dL (ref 6.5–8.1)

## 2015-05-13 LAB — I-STAT CG4 LACTIC ACID, ED: Lactic Acid, Venous: 2.24 mmol/L (ref 0.5–2.0)

## 2015-05-13 LAB — BASIC METABOLIC PANEL
Anion gap: 9 (ref 5–15)
BUN: 12 mg/dL (ref 6–20)
CALCIUM: 8.9 mg/dL (ref 8.9–10.3)
CHLORIDE: 109 mmol/L (ref 101–111)
CO2: 21 mmol/L — AB (ref 22–32)
CREATININE: 0.61 mg/dL (ref 0.44–1.00)
GLUCOSE: 76 mg/dL (ref 65–99)
Potassium: 3.4 mmol/L — ABNORMAL LOW (ref 3.5–5.1)
Sodium: 139 mmol/L (ref 135–145)

## 2015-05-13 LAB — CARBAMAZEPINE LEVEL, TOTAL: Carbamazepine Lvl: 2.4 ug/mL — ABNORMAL LOW (ref 4.0–12.0)

## 2015-05-13 LAB — CBG MONITORING, ED: Glucose-Capillary: 90 mg/dL (ref 65–99)

## 2015-05-13 MED ORDER — LORAZEPAM 2 MG/ML IJ SOLN
1.0000 mg | INTRAMUSCULAR | Status: DC | PRN
  Filled 2015-05-13: qty 1

## 2015-05-13 MED ORDER — CARBAMAZEPINE 200 MG PO TABS: 200.0000 mg | ORAL_TABLET | Freq: Two times a day (BID) | ORAL | Status: DC

## 2015-05-13 MED ORDER — AMMONIA AROMATIC IN INHA
RESPIRATORY_TRACT | Status: AC
  Filled 2015-05-13: qty 10

## 2015-05-13 MED ORDER — LORAZEPAM 2 MG/ML IJ SOLN
2.0000 mg | Freq: Once | INTRAMUSCULAR | Status: AC
  Administered 2015-05-13: 2 mg via INTRAMUSCULAR

## 2015-05-13 MED ORDER — LORAZEPAM 2 MG/ML IJ SOLN
INTRAMUSCULAR | Status: AC
  Filled 2015-05-13: qty 1

## 2015-05-13 NOTE — Progress Notes (Signed)
Patient ID: Penny [name], female   DOB: July 19, 1986, 29 y.o.   MRN: [id number]  Patient returned from the ED, initially stating that she wanted to discharge. Attending MD Psychiatrist Cobos spoke with patient about the importance of staying inpatient and reassess tomorrow. Patient reported she will stay and rescinded her 72 hour request for discharge. Patient received PRN tylenol for a headache and PRN Klonopin for anxiety. Patient received a meal and was laying down. Patient was tearful however cooperative. Patient denies SI/HI and A/V hallucinations. Patient reports she is sad due to missing her son's graduation from kindergarten tomorrow. Writer provided emotional support and encouragement. Q15 minute safety checks maintained. Patient is no distress at this time. Writer will continue to monitor.

## 2015-05-13 NOTE — ED Notes (Signed)
Several people have tried getting blood from this patient, she is a hard stick, I called lab and they are on the way to get her blood.
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
## 2015-05-13 NOTE — Discharge Instructions (Signed)
Psychiatrist at behavioral health has recommended you continue on the new medication started this morning, Tegretol.   Recommendations from Neurologist" Overall she would most benefit from sustained outpatient psychological therapy.   1) Contacted patients outpatient neurologist, Dr [name], to schedule follow up for further migraine management. They will contact patient to schedule appointment  2) Contacted multiple psychiatrists to attempt to schedule an outpatient evaluation. They all require insurance/financial information prior to scheduling appointment. Referral has been placed in Epic. Suggest patient contact following numbers to schedule an appointment: [phone] Dr Norma Fredrickson [phone] [facility]     Major Depressive Disorder Major depressive disorder is a mental illness. It also may be called clinical depression or unipolar depression. Major depressive disorder usually causes feelings of sadness, hopelessness, or helplessness. Some people with this disorder do not feel particularly sad but lose interest in doing things they used to enjoy (anhedonia). Major depressive disorder also can cause physical symptoms. It can interfere with work, school, relationships, and other normal everyday activities. The disorder varies in severity but is longer lasting and more serious than the sadness we all feel from time to time in our lives. Major depressive disorder often is triggered by stressful life events or major life changes. Examples of these triggers include divorce, loss of your job or home, a move, and the death of a family member or close friend. Sometimes this disorder occurs for no obvious reason at all. People who have family members with major depressive disorder or bipolar disorder are at higher risk for developing this disorder, with or without life stressors. Major depressive disorder can occur at any age. It may occur just once in your life (single episode major  depressive disorder). It may occur multiple times (recurrent major depressive disorder). SYMPTOMS People with major depressive disorder have either anhedonia or depressed mood on nearly a daily basis for at least 2 weeks or longer. Symptoms of depressed mood include:  Feelings of sadness (blue or down in the dumps) or emptiness.  Feelings of hopelessness or helplessness.  Tearfulness or episodes of crying (may be observed by others).  Irritability (children and adolescents). In addition to depressed mood or anhedonia or both, people with this disorder have at least four of the following symptoms:  Difficulty sleeping or sleeping too much.   Significant change (increase or decrease) in appetite or weight.   Lack of energy or motivation.  Feelings of guilt and worthlessness.   Difficulty concentrating, remembering, or making decisions.  Unusually slow movement (psychomotor retardation) or restlessness (as observed by others).   Recurrent wishes for death, recurrent thoughts of self-harm (suicide), or a suicide attempt. People with major depressive disorder commonly have persistent negative thoughts about themselves, other people, and the world. People with severe major depressive disorder may experiencedistorted beliefs or perceptions about the world (psychotic delusions). They also may see or hear things that are not real (psychotic hallucinations). DIAGNOSIS Major depressive disorder is diagnosed through an assessment by your health care provider. Your health care provider will ask aboutaspects of your daily life, such as mood,sleep, and appetite, to see if you have the diagnostic symptoms of major depressive disorder. Your health care provider may ask about your medical history and use of alcohol or drugs, including prescription medicines. Your health care provider also may do a physical exam and blood work. This is because certain medical conditions and the use of certain  substances can cause major depressive disorder-like symptoms (secondary depression). Your health care provider also may  refer you to a mental health specialist for further evaluation and treatment. TREATMENT It is important to recognize the symptoms of major depressive disorder and seek treatment. The following treatments can be prescribed for this disorder:   Medicine. Antidepressant medicines usually are prescribed. Antidepressant medicines are thought to correct chemical imbalances in the brain that are commonly associated with major depressive disorder. Other types of medicine may be added if the symptoms do not respond to antidepressant medicines alone or if psychotic delusions or hallucinations occur.  Talk therapy. Talk therapy can be helpful in treating major depressive disorder by providing support, education, and guidance. Certain types of talk therapy also can help with negative thinking (cognitive behavioral therapy) and with relationship issues that trigger this disorder (interpersonal therapy). A mental health specialist can help determine which treatment is best for you. Most people with major depressive disorder do well with a combination of medicine and talk therapy. Treatments involving electrical stimulation of the brain can be used in situations with extremely severe symptoms or when medicine and talk therapy do not work over time. These treatments include electroconvulsive therapy, transcranial magnetic stimulation, and vagal nerve stimulation. Document Released: 03/19/2013 Document Revised: 04/08/2014 Document Reviewed: 03/19/2013 [facility] Patient Information 2015 [place], [place]. This information is not intended to replace advice given to you by your health care provider. Make sure you discuss any questions you have with your health care provider.  Deep Brain Stimulation Deep brain stimulation is used to treat certain medical conditions that do not respond to conventional medical  treatments. An electric current is sent to an area of the brain that is causing problems. A device that generates this electric current sends it in pulses to a specific area deep inside the brain. Deep brain stimulation has been used to treat several medical conditions. It was first used to treat pain when medicines and other traditional therapies did not work. More recently, it has been used to treat people with medicine-resistant movement disorders, including Parkinson's disease, essential tremor, epilepsy, rare inherited disorders of muscle movement (dystonias), cluster headaches, and multiple sclerosis. Deep brain stimulation is also being used to treat cases of obsessive-compulsive disorder and depression that fail to respond to conventional treatments, such as medicine and shock therapy (electroconvulsive therapy). Deep brain stimulation is a safe and convenient treatment. It can be used 24 hours per day. The intensity and frequency of the pulses can be adjusted. It can be easily discontinued at any time. Medicines and other treatments can be used at the same time as deep brain stimulation. Generally, deep brain stimulation can be used without causing permanent damage to brain tissue. The deep brain stimulation device has 3 parts:  A lead. This is a thin wire. It goes through a small opening in the skull. It delivers the electric pulse.  A power source. This is called the neurotransmitter. It is usually placed under the skin in the upper part of the chest, similar to how a heart pacemaker is inserted. It is powered by a long-lasting battery.  An extension. This is a wire that connects the lead to the power source. The extension is passed under the skin of the head and neck and down to the power source. The electric pulses are automatic. The timing is set before the device is placed in the body. A computer can send a radio signal to the neurotransmitter if the frequency of the pulse needs to be  adjusted. A hand-held controller can be used to turn the neurotransmitter  off. This is done if side effects of treatment are bad or if battery power needs to be conserved. PROCEDURE The surgery to insert a deep brain stimulation device is done in 2 parts:  The lead is inserted:  Numbing medicine (local anesthetic) is injected into your scalp. You are awake but feel no pain.  A small hole is made in your skull.  A computer is used to make a map of your brain. This shows the part of the brain that needs to be treated.  Your caregiver will ask you various questions. This helps the surgeon find the most appropriate part of the brain to place the lead.  The neurotransmitter and extension wire are inserted:  Medicine to make you go to sleep (general anesthetic) is used for this part of the procedure.  A small opening is made in the skin behind your ear. The extension wire is inserted through this opening.  A second opening is made in the upper part of your chest. This is for the neurotransmitter. RISKS AND COMPLICATIONS The procedure to insert a deep brain stimulation device is generally safe. However, as with all surgical procedures, there are some risks. These risks may be greater for the following people:  People older than 70 years.  People with blood vessel disease.  People receiving antiplatelet or anticoagulant therapy.  People with unstable medical conditions, such as severe coronary artery disease, severe hypertension, uncontrolled diabetes, previous stroke, heart failure, and severe dementia. If complications occur, they are usually temporary. They may include bleeding, leaking of fluid from around the brain, and infection. Possible side effects from insertion of the device include:  Temporary tingling in the face, arms, or legs.  Pain or swelling in the areas where the wires are placed.  An allergic reaction to parts of the device.  Slight problems with vision or  speech.  Slight problem with balance.  Slight loss of movement.  Some jolting or shocking sensations. HOME CARE INSTRUCTIONS  Understand how to operate the device. Be sure to ask whether it is okay to turn it off at night.  Ask if there are any times or places when the device should not be turned on. Electrical devices at home will not affect the device. This includes microwaves and computers.  Continue to take any medicine prescribed by your caregiver. Do not start taking any new medicine unless your caregiver says it is okay. This includes over-the-counter medicines.  Keep all follow-up appointments. The device will need to be checked periodically by your caregiver. Batteries usually last for 3 to 5 years. FOR MORE INFORMATION [employer]: www.aans.org Document Released: 03/09/2011 Document Revised: 02/14/2012 Document Reviewed: 03/09/2011 [facility] Patient Information 2015 [place], [place]. This information is not intended to replace advice given to you by your health care provider. Make sure you discuss any questions you have with your health care provider.  Nonepileptic Seizures Nonepileptic seizures are seizures that are not caused by abnormal electrical signals in your brain. These seizures often seem like epileptic seizures, but they are not caused by epilepsy.  There are two types of nonepileptic seizures:  A physiologic nonepileptic seizure results from a disruption in your brain.  A psychogenic seizure results from emotional stress. These seizures are sometimes called pseudoseizures. CAUSES  Causes of physiologic nonepileptic seizures include:   Sudden drop in blood pressure.  Low blood sugar.  Low levels of salt (sodium) in your blood.  Low levels of calcium in your blood.  Migraine.  Heart rhythm problems.  Sleep disorders.  Drug and alcohol abuse. Common causes of psychogenic nonepileptic seizures  include:  Stress.  Emotional trauma.  Sexual or physical abuse.  Major life events, such as divorce or the death of a loved one.  Mental health disorders, including panic attack and hyperactivity disorder. SIGNS AND SYMPTOMS A nonepileptic seizure can look like an epileptic seizure, including uncontrollable shaking (convulsions), or changes in attention, behavior, or the ability to remain awake and alert. However, there are some differences. Nonepileptic seizures usually:  Do not cause physical injuries.  Start slowly.  Include crying or shrieking.  Last longer than 2 minutes.  Have a short recovery time without headache or exhaustion. DIAGNOSIS  Your health care provider can usually diagnose nonepileptic seizures after taking your medical history and giving you a physical exam. Your health care provider may want to talk to your friends or relatives who have seen you have a seizure.  You may also need to have tests to look for causes of physiologic nonepileptic seizures. This may include an electroencephalogram (EEG), which is a test that measures electrical activity in your brain. If you have had an epileptic seizure, the results of your EEG will be abnormal. If your health care provider thinks you have had a psychogenic nonepileptic seizure, you may need to see a mental health specialist for an evaluation. TREATMENT  Treatment depends on the type and cause of your seizures.  For physiologic nonepileptic seizures, treatment is aimed at addressing the underlying condition that caused the seizures. These seizures usually stop when the underlying condition is properly treated.  Nonepileptic seizures do not respond to the seizure medicines used to treat epilepsy.  For psychogenic seizures, you may need to work with a mental health specialist. [place] care will depend on the type of nonepileptic seizures you have.   Follow all your health care provider's  instructions.  Keep all your follow-up appointments. SEEK MEDICAL CARE IF: You continue to have seizures after treatment. SEEK IMMEDIATE MEDICAL CARE IF:  Your seizures change or become more frequent.  You injure yourself during a seizure.  You have one seizure after another.  You have trouble recovering from a seizure.  You have chest pain or trouble breathing. MAKE SURE YOU:  Understand these instructions.  Will watch your condition.  Will get help right away if you are not doing well or get worse. Document Released: 01/07/2006 Document Revised: 04/08/2014 Document Reviewed: 09/18/2013 [facility] Patient Information 2015 [place], [place]. This information is not intended to replace advice given to you by your health care provider. Make sure you discuss any questions you have with your health care provider.

## 2015-05-13 NOTE — Progress Notes (Signed)
Pt became oppositional and profane in the dining hall this morning upon witnessing another Pt be informed that food cannot be brought back to the unit from the dining hall. Unit policy was explained to her, but she responded by repeatedly cursing and interrupting with "Women over men! Women over men!" Pt continued with "Fuck you, grumpy!" and so on. Pt was not receptive to unit policy education or redirection.

## 2015-05-13 NOTE — Progress Notes (Addendum)
Patient ID: Penny [name], female   DOB: [birth year], 29 y.o.   MRN: [id number]  0729- Writer entered room to reassess patient's pain level. Patient was gazing to the right looking out the window. Writer called patient's name and she looked at Probation officer. Writer asked patient's pain level however patient appeared to be gazing again. Patient stated, "I don't know" when writer again asked her pain level. Patient then stated, "I think.Marland KitchenMarland KitchenI'm going...to have.Marland Kitchena seizure." Patient's pupils were dilated. Writer called for help. [phone]- Patient began having seizure like activity. Charge RN Sharl Ma was notified. [facility]HT Patience came and returned with a V/S machine. 0755- V/S BP 136/66, pulse 107, and respiration rate 60. Patient was non-responsive (non-verbal) at this time. Patient's eyes were closed. When writer checked pupils, they were dilated. 0737- Another seizure like activity phase began, PA Patriciaann Clan was called by charge [employer]. EMS was called as well. This seizure like activity started at 0737 until 0740. Patient had muscle rigidity at this time. 0740- Vital signs were taken; BP 158/100, pulse 124, 100% Room Air, and 72 respiration rate. Patient able to open her eyes. 0742 2 mg/ 1 ml Ativan administered to patient IM left gluteus. 0744 1 liter of Oxygen administered. 0747 Patient begins to have another episode of seizure like activity which ended within the minute. 0748 Vitals BP 149/82, respiration rate 28, 74 pulse, and 100% on 1 L of oxygen. Patient able to open eyes however still does not speak. 0750 EMS arrived. 0751 EMS retrieved CBG and stated that her CBG was 171. 0754 patient left with EMS and transported to [facility]   Patient's mother was contacted about patient being transported to [facility] ED.

## 2015-05-13 NOTE — [facility] Group Notes (Signed)
[place] Group Notes:  recovery  Date:  05/13/2015  Time:  5:11 PM  Type of Therapy:  Nurse Education  Participation Level:  Did Not Attend  Participation Quality:  Inattentive  Affect:  Blunted  Cognitive:  Lacking  Insight:  None  Engagement in Group:  None  Modes of Intervention:  Discussion  Summary of Progress/Problems:  Delman Kitten 05/13/2015, 5:11 PM

## 2015-05-13 NOTE — ED Provider Notes (Signed)
CSN: [id number]     Arrival date & time 05/13/15  0827 History   First MD Initiated Contact with Patient 05/13/15 [phone]     Chief Complaint  Patient presents with  . Seizures      HPI  Social history evaluation after being transferred from behavioral [facility]. Admitted there after transfer from [facility] on 05-09-2015.  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 Seen by psychiatry 64, and 65. Had her Cymbalta adjusted on 64. Tegretol was added this morning. Had an episode this morning where she was thought to be having a seizure. Her eyes closed. She was shaking her arms and legs. However she was immediately awake and alert within 1 minute. Was given IM Ativan. Had a second episode. This was approximately 20 minutes after the patient had an emotional profanity ridden exchange with the staff there about another patient's food tray.  Past Medical History  Diagnosis Date  . Migraines   . Interstitial cystitis   . IUD FEB 2010  . Gastritis JULY 2011  . Depression   . 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 Fatty liver   . Obesity (BMI 30-39.9) 2011 228 LBS BMI 36.8  . Iron deficiency anemia 07/23/2010  . Irritable bowel syndrome 2012 DIARRHEA    JUN 2012 TTG IgA 14.9  . Elevated liver enzymes JUL 2011ALK PHOS 111-127 AST  143-267 ALT  213-321T BILI 0.6  ALB  3.7-4.06 Jun 2011 ALK PHOS 118 AST 24 ALT 42 T BILI 0.4 ALB 3.9  . Chronic daily headache   . Ovarian cyst   . ADD (attention deficit disorder)   . RLQ abdominal pain 07/18/2013  . Menorrhagia 07/18/2013  . Anxiety   . Potassium (K) deficiency   . Dysmenorrhea 09/18/2013  . Stress 09/18/2013  . Patient desires pregnancy 09/18/2013  . Pregnant 12/25/2013  . Blood transfusion without reported diagnosis   . Heart murmur   . Gastric bypass status for obesity   . 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 Dysrhythmia   . Sciatica of left side 11/14/2014  . Fibromyalgia   . Hereditary and  idiopathic peripheral neuropathy 01/15/2015  . PONV (postoperative nausea and vomiting) seizure post-operatively   Past Surgical History  Procedure Laterality Date  . Gab  2007    in [place]-POUCH 5 CM  . Cholecystectomy  2005    biliary dyskinesia  . Cath self every nite      for sodium bicarb injection (discontinued 2013)  . Colonoscopy  JUN 2012 ABD PN/DIARRHEA WITH PROPOFOL    NL COLON  . Upper gastrointestinal endoscopy  JULY 2011 NAUSEA-D125,V6, PH 25    Bx; GASTRITIS, POUCH-5 CM LONG  . Dilation and curettage of uterus    . Tonsillectomy    . Gastric bypass  06/2006  . Savory dilation  06/20/2012    Dr. Barnie Alderman gastritis/Ulcer in the mid jejunum. Empiric dilation.   . Tonsillectomy and adenoidectomy    . Esophagogastroduodenoscopy    . Wisdom tooth extraction    . Repair vaginal cuff N/A 07/30/2014    Procedure: REPAIR VAGINAL CUFF;  Surgeon: Mora Bellman, MD;  Location: [facility];  Service: Gynecology;  Laterality: N/A;  . Hysteroscopy w/d&c N/A 09/12/2014    Procedure: DILATATION AND CURETTAGE /HYSTEROSCOPY;  Surgeon: Jonnie Kind, MD;  Location: [facility];  Service: Gynecology;  Laterality: N/A;  . Esophagogastroduodenoscopy (egd) with propofol N/A  04/10/2015    Procedure: ESOPHAGOGASTRODUODENOSCOPY (EGD) WITH PROPOFOL;  Surgeon: Daneil Dolin, MD;  Location: [facility];  Service: Endoscopy;  Laterality: N/A;  . Esophageal dilation N/A 04/10/2015    Procedure: ESOPHAGEAL DILATION WITH 54FR MALONEY DILATOR;  Surgeon: Daneil Dolin, MD;  Location: [facility];  Service: Endoscopy;  Laterality: N/A;  . Esophageal biopsy  04/10/2015    Procedure: BIOPSY;  Surgeon: Daneil Dolin, MD;  Location: [facility];  Service: Endoscopy;;   Family History  Problem Relation Age of Onset  . Hemochromatosis Maternal Grandmother   . Migraines Maternal Grandmother   . Cancer Maternal Grandmother   . Breast cancer Maternal Grandmother   . Hypertension Father   . Diabetes Father   . Coronary artery  disease Father   . Migraines Paternal Grandmother   . Breast cancer Paternal Grandmother   . Cancer Mother     breast  . Hemochromatosis Mother   . Breast cancer Mother   . Coronary artery disease Paternal Grandfather    History  Substance Use Topics  . Smoking status: Current Every Day Smoker -- 0.50 packs/day for 6 years    Types: Cigarettes  . Smokeless tobacco: Never Used     Comment: smokes 1 cigarette a day  . Alcohol Use: No   OB History    Gravida Para Term Preterm AB TAB SAB Ectopic Multiple Living   '3 2 1 1 1  1   2     '$ Review of Systems  Constitutional: Negative for fever, chills, diaphoresis, appetite change and fatigue.  HENT: Negative for mouth sores, sore throat and trouble swallowing.   Eyes: Negative for visual disturbance.  Respiratory: Negative for cough, chest tightness, shortness of breath and wheezing.   Cardiovascular: Negative for chest pain.  Gastrointestinal: Negative for nausea, vomiting, abdominal pain, diarrhea and abdominal distention.  Endocrine: Negative for polydipsia, polyphagia and polyuria.  Genitourinary: Negative for dysuria, frequency and hematuria.  Musculoskeletal: Positive for back pain. Negative for gait problem.  Skin: Negative for color change, pallor and rash.  Neurological: Positive for headaches. Negative for dizziness, syncope and light-headedness.       Possible seizure  Hematological: Does not bruise/bleed easily.  Psychiatric/Behavioral: Negative for behavioral problems and confusion.      Allergies  Gabapentin; Metoclopramide hcl; Codeine; Zofran; Latex; and Tape  Home Medications   Prior to Admission medications   Medication Sig Start Date End Date Taking? Authorizing Provider  carbamazepine (TEGRETOL) 200 MG tablet Take 1 tablet (200 mg total) by mouth 2 (two) times daily. 05/13/15   Tanna Furry, MD  clonazePAM (KLONOPIN) 0.5 MG tablet Take 1 tablet (0.5 mg total) by mouth 3 (three) times daily as needed for anxiety.  04/10/15   Radene Gunning, NP  cyclobenzaprine (FLEXERIL) 5 MG tablet TAKE 1 TABLET BY MOUTH EVERY EIGHT HOURS AS NEEDED FOR MUSCLE SPASMS. 03/17/15   Kathrynn Ducking, MD  cyproheptadine (PERIACTIN) 2 MG/5ML syrup Take 10 mLs (4 mg total) by mouth 3 (three) times daily as needed (abd pain). Patient not taking: Reported on 05/09/2015 05/05/15   Elmyra Ricks Pisciotta, PA-C  DULoxetine (CYMBALTA) 30 MG capsule Take 1-2 capsules (30-60 mg total) by mouth 2 (two) times daily. Take 1 capsule (30 mg) every morning and 2 capsules (60 mg) every night 02/24/15   Albertine Patricia, MD  ferrous sulfate 325 (65 FE) MG tablet Take 1 tablet (325 mg total) by mouth 2 (two) times daily with a meal. Patient not taking:  Reported on 04/22/2015 02/06/15   Eyvonne Mechanic, PA-C  HYDROcodone-acetaminophen (NORCO/VICODIN) 5-325 MG per tablet Take 1 tablet by mouth every 6 (six) hours as needed. 04/24/15   Len Blalock, NP  hyoscyamine (LEVSIN SL) 0.125 MG SL tablet Place 1 tablet (0.125 mg total) under the tongue every 4 (four) hours as needed. Patient taking differently: Place 0.125 mg under the tongue every 4 (four) hours as needed for cramping.  04/17/15   Nira Retort, NP  lidocaine (LIDODERM) 5 % Place 1 patch onto the skin daily. Remove & Discard patch within 12 hours or as directed by MD Patient taking differently: Place 1 patch onto the skin daily as needed (pain). Remove & Discard patch within 12 hours or as directed by MD 04/22/15   Len Blalock, NP  pantoprazole (PROTONIX) 40 MG tablet Take 1 tablet (40 mg total) by mouth 2 (two) times daily before a meal. 04/10/15   Gwenyth Bender, NP  promethazine (PHENERGAN) 25 MG suppository Place 1 suppository (25 mg total) rectally every 6 (six) hours as needed for nausea or vomiting. 05/05/15   Joni Reining Pisciotta, PA-C  promethazine (PHENERGAN) 25 MG tablet Take 1 tablet (25 mg total) by mouth every 6 (six) hours as needed for nausea or vomiting. 04/15/15   Eber Hong, MD  sucralfate  (CARAFATE) 1 GM/10ML suspension Take 10 mLs (1 g total) by mouth 4 (four) times daily -  with meals and at bedtime. Patient not taking: Reported on 04/22/2015 04/10/15   Gwenyth Bender, NP  topiramate (TOPAMAX) 100 MG tablet Take 1 tablet (100 mg total) by mouth at bedtime. 02/18/15   York Spaniel, MD  zolpidem (AMBIEN) 5 MG tablet Take 1 tablet (5 mg total) by mouth at bedtime as needed for sleep. 12/26/14   Babs Sciara, MD   BP 128/72 mmHg  Pulse 63  Temp(Src) 97.8 F (36.6 C) (Oral)  Resp 18  Ht 5\' 6"  (1.676 m)  Wt 230 lb (104.327 kg)  BMI 37.14 kg/m2  SpO2 100%  LMP 05/02/2015 Physical Exam  Constitutional: She is oriented to person, place, and time. She appears well-developed and well-nourished. No distress.  HENT:  Head: Normocephalic.  Eyes: Conjunctivae are normal. Pupils are equal, round, and reactive to light. No scleral icterus.  Neck: Normal range of motion. Neck supple. No thyromegaly present.  Cardiovascular: Normal rate and regular rhythm.  Exam reveals no gallop and no friction rub.   No murmur heard. Pulmonary/Chest: Effort normal and breath sounds normal. No respiratory distress. She has no wheezes. She has no rales.  Abdominal: Soft. Bowel sounds are normal. She exhibits no distension. There is no tenderness. There is no rebound.  Musculoskeletal: Normal range of motion.  Neurological: She is alert and oriented to person, place, and time.  Patient awake upon arrival here. She will close her eyes intermittently. But she'll open them to voice. She follows commands. Does not show any sign of being postictal.  Skin: Skin is warm and dry. No rash noted.  Psychiatric: She has a normal mood and affect. Her behavior is normal.    ED Course  Procedures (including critical care time) Labs Review Labs Reviewed  CBC WITH DIFFERENTIAL/PLATELET - Abnormal; Notable for the following:    Hemoglobin 10.6 (*)    HCT 33.7 (*)    MCH 25.2 (*)    RDW 21.1 (*)    Monocytes  Relative 14 (*)    All other components within normal limits  BASIC METABOLIC PANEL - Abnormal; Notable for the following:    Potassium 3.4 (*)    CO2 21 (*)    All other components within normal limits  I-STAT CG4 LACTIC ACID, ED - Abnormal; Notable for the following:    Lactic Acid, Venous 2.24 (*)    All other components within normal limits  CBG MONITORING, ED    Imaging Review No results found.   EKG Interpretation None      MDM   Final diagnoses:  Pseudoseizure    Patient presents awake. After an alleged seizure ending approximately 2 minutes before her arrival. She is speaking. She'll open her eyes on command. She is following commands. She does not appear postictal. During the course of her ER stay approximate 5 minutes later was called to room because "she's having another seizure". His other of her eyelids are fluttering. When I attempt to open her eye she closes them tightly. When I call her name she opens her eyes and looks at me and asked "what".  I asked Dr. [name] neurology to evaluate the patient. He has spoken with her and her mother and reviewed her history and previous studies. Patient had another "episode" while he was in the room. 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 She becomes more awake and alert with an approximately 1 minute of her episode.  [name] offered repeat EEG and video EEG. Patient and her mother both declined. Mother states that these look exactly like her previous episodes. Dr. 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 None were able to secure an appointment for her without her contact them in person. She was given this information.  I spoke with the mother upon her arrival at length. His aneurysm the patient's mother states "I'm going to try not to knock you out". A long discussion about what she was upset. She states that "nothing that you  said was going to happen it happened. Has able to review the notes from behavioral health hospital demonstrate the patient and her mother that she was seen by psychiatrist upon her arrival and there were 2 full notes from psychiatry as well as multiple progress notes from the PA at behavioral health. Her medicines were adjusted on Friday night with a change in her Cymbalta dose, and Tegretol was added this morning.   Patient had an episode this morning where she became irate that another patient was not allowed to take food and 20 area where was not allowed. She was screaming and profane. This happened approximately 20 minutes before episodes of "seizures". We discussed with her that her episodes of somatization  And pseudoseizures seem to be triggered by emotional upset.  Offered her the option of readmission at behavioral health. She adamantly declines this. She'll be discharged to continue on her medications as per the recent adjustment. I did speak with the provider at behavioral health. They've reviewed her chart. They would like her to continue on the outpatient Tegretol.  Dr. [name] contacted Dr. [name] the patient's neurologist. He was not available to speak with directly. The office will contact the patient with a follow-up appointment.     Tanna Furry, MD 05/13/15 [phone]

## 2015-05-13 NOTE — ED Notes (Signed)
Main lab called for phlebotomy to draw lab. Multiple attempts made by ED staff.

## 2015-05-13 NOTE — Progress Notes (Signed)
Patient ID: Penny [name], female   DOB: 21-Mar-1986, 29 y.o.   MRN: [id number] [facility] MD Progress Note  05/13/2015 3:58 PM HEWAN STUHL  MRN:  [id number] Subjective:   Patient  States she feels tired and subjectively confused - she states she has little recollection of events that occurred this AM.. Objective : I have discussed case with treatment team and have met with patient. I also had family meeting with patient , her mother, and Unit RN.  Patient had a seizure like episode this AM. It is described as having had an aura where she stated she felt dizzy and sick. She had episode of  Being non responsive , increased tonicity on arms, and extension of neck. No loss of bladder control, no tongue bite.  This  Apparently occurred soon after a verbal confrontation with another patient in the cafeteria, which she states she cannot remember. States that being confrontational . Loud is very unlike her . States she cannot remember anything of what occurred . She was sent to ED, and deemed to have pseudo seizure .  Upon return to unit, ( with mother), patient depressed, sad, tearful, reporting frustration about seizure like episodes and how they have negatively affected her quality of life . Mother also visibly frustrated- stated " she never had this before, and they say " it's psychiatric, it's in your head, what are we supposed to do?" and similar statements. Mother does confirm patient has been extensively worked up, to include EEG with CCTV . Patient  And mother state that since her difficult pregnancy and ICU admission for preeclampsia  She started having these seizure like episodes   " 2 days after she got to ICU ". Mother states she had no prior history of " anything like this", and is concerned that there may be some medical yet unidentified illness causing episodes .  They responded well to support, empathy, encouragement- patient calmed down significantly. Of note she has denied any SI. Thus  far tolerating Carbamazepine well- it was started as she describes significant mood swings and episodes  Consistent with  hypomania in the past - we have reviewed side effects, teratogenic effects, rationale. I have requested Hospitalist consult to follow up on above, and on patient's report of intermittent  RUQ discomfort , intermittent LFT elevation.  Principal Problem: Major depressive disorder, recurrent episode, severe with peripartum onset Diagnosis:   Patient Active Problem List   Diagnosis Date Noted  . Major depressive disorder, recurrent episode, severe with peripartum onset [F33.2] 05/10/2015  . Post partum depression [F53] 05/09/2015  . Anastomotic ulcer [K28.9]   . Dysphagia [R13.10]   . Hematemesis [K92.0] 04/07/2015  . Intractable vomiting with nausea [R11.10] 04/07/2015  . Elevated liver enzymes [R74.8]   . Epigastric pain [R10.13]   . Abdominal pain [R10.9]   . Microcytic anemia [D50.9]   . 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 Seizure-like activity [R56.9] 02/22/2015  . Fibromyalgia [M79.7] 02/18/2015  . Vulvar fissure [N90.89] 02/18/2015  . Current smoker [Z72.0] 01/29/2015  . Hereditary and idiopathic peripheral neuropathy [G60.9] 01/15/2015  . Leg weakness, bilateral [R29.898] 01/02/2015  . Gait disorder [R26.9] 01/02/2015  . Cervicalgia [M54.2] 12/18/2014  . Sciatica of left side [M54.32] 11/14/2014  . 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 Abdominal pain, right lower quadrant [R10.31] 08/22/2014  . Headache, migraine [G43.909] 08/19/2014  . Seizure [R56.9] 08/18/2014  . Encephalopathy [G93.40] 08/13/2014  . Headache [R51] 08/13/2014  . Altered mental status [R41.82] 08/13/2014  . Right leg  numbness [R20.8] 07/31/2014  . Previous gastric bypass affecting pregnancy, antepartum [O99.840] 05/22/2014  . 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 Depression complicating pregnancy in second trimester, antepartum [O96.295, F32.9] 05/09/2014   . Rapid palpitations [R00.2] 02/18/2014  . H/O maternal third degree perineal laceration, currently pregnant [O09.299] 02/18/2014  . Maternal iron deficiency anemia complicating pregnancy [O99.019, D50.9] 01/16/2014  . Restless legs [G25.81] 01/16/2014  . Chronic interstitial cystitis [N30.10] 10/23/2013  . Menorrhagia [N92.0] 07/18/2013  . h/o Opiate addiction [F11.20] 03/11/2012    Class: Acute  . Passive suicidal ideations [R45.851] 03/10/2012    Class: Acute  . History of migraine headaches [Z86.69] 03/10/2012    Class: Acute  . Depression with anxiety [F41.8] 03/10/2012    Class: Chronic  . 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 Pelvic congestion syndrome [N94.89] 10/13/2011  . Coitalgia [N94.1] 10/13/2011  . Chronic migraine without aura [G43.709] 10/13/2011  . IBS (irritable bowel syndrome) [K58.9] 08/25/2011  . Diarrhea [R19.7] 05/27/2011  . OBESITY, UNSPECIFIED [E66.9] 09/17/2010  . LIVER FUNCTION TESTS, ABNORMAL, HX OF [Z86.39] 09/17/2010  . Iron deficiency anemia [D50.9] 07/23/2010   Total Time spent with patient: 30 minutes greater than  50% of session spent on counseling and coordination of care    Past Medical History:  Past Medical History  Diagnosis Date  . Migraines   . Interstitial cystitis   . IUD FEB 2010  . Gastritis JULY 2011  . Depression   . 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 Fatty liver   . Obesity (BMI 30-39.9) 2011 228 LBS BMI 36.8  . Iron deficiency anemia 07/23/2010  . Irritable bowel syndrome 2012 DIARRHEA    JUN 2012 TTG IgA 14.9  . Elevated liver enzymes JUL 2011ALK PHOS 111-127 AST  143-267 ALT  213-321T BILI 0.6  ALB  3.7-4.06 Jun 2011 ALK PHOS 118 AST 24 ALT 42 T BILI 0.4 ALB 3.9  . Chronic daily headache   . Ovarian cyst   . ADD (attention deficit disorder)   . RLQ  abdominal pain 07/18/2013  . Menorrhagia 07/18/2013  . Anxiety   . Potassium (K) deficiency   . Dysmenorrhea 09/18/2013  . Stress 09/18/2013  . Patient desires pregnancy 09/18/2013  . Pregnant 12/25/2013  . Blood transfusion without reported diagnosis   . Heart murmur   . Gastric bypass status for obesity   . 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 Dysrhythmia   . Sciatica of left side 11/14/2014  . Fibromyalgia   . 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 PONV (postoperative nausea and vomiting) seizure post-operatively    Past Surgical History  Procedure Laterality Date  . Gab  2007    in [place]-POUCH 5 CM  . Cholecystectomy  2005    biliary dyskinesia  . Cath self every nite      for sodium bicarb injection (discontinued 2013)  . Colonoscopy  JUN 2012 ABD PN/DIARRHEA WITH PROPOFOL    NL COLON  . Upper gastrointestinal endoscopy  JULY 2011 NAUSEA-D125,V6, PH 25    Bx; GASTRITIS, POUCH-5 CM LONG  . Dilation and curettage of uterus    . Tonsillectomy    . Gastric bypass  06/2006  . Savory dilation  06/20/2012    Dr. Cyndi Bender gastritis/Ulcer  in the mid jejunum. Empiric dilation.   . Tonsillectomy and adenoidectomy    . Esophagogastroduodenoscopy    . Wisdom tooth extraction    . Repair vaginal cuff N/A 07/30/2014    Procedure: REPAIR VAGINAL CUFF;  Surgeon: Catalina Antigua, MD;  Location: [facility];  Service: Gynecology;  Laterality: N/A;  . Hysteroscopy w/d&c N/A 09/12/2014    Procedure: DILATATION AND CURETTAGE /HYSTEROSCOPY;  Surgeon: Tilda Burrow, MD;  Location: [facility];  Service: Gynecology;  Laterality: N/A;  . Esophagogastroduodenoscopy (egd) with propofol N/A 04/10/2015    Procedure: ESOPHAGOGASTRODUODENOSCOPY (EGD) WITH PROPOFOL;  Surgeon: Corbin Ade, MD;  Location: [facility];  Service: Endoscopy;  Laterality: N/A;  . Esophageal dilation N/A 04/10/2015    Procedure: ESOPHAGEAL DILATION WITH 54FR MALONEY DILATOR;  Surgeon: Corbin Ade, MD;   Location: [facility];  Service: Endoscopy;  Laterality: N/A;  . Esophageal biopsy  04/10/2015    Procedure: BIOPSY;  Surgeon: Corbin Ade, MD;  Location: [facility];  Service: Endoscopy;;   Family History:  Family History  Problem Relation Age of Onset  . Hemochromatosis Maternal Grandmother   . Migraines Maternal Grandmother   . Cancer Maternal Grandmother   . Breast cancer Maternal Grandmother   . Hypertension Father   . Diabetes Father   . Coronary artery disease Father   . Migraines Paternal Grandmother   . Breast cancer Paternal Grandmother   . Cancer Mother     breast  . Hemochromatosis Mother   . Breast cancer Mother   . Coronary artery disease Paternal Grandfather    Social History:  History  Alcohol Use No     History  Drug Use No    History   Social History  . Marital Status: Single    Spouse Name: N/A  . Number of Children: 2  . Years of Education: some colle   Occupational History  .     Social History Main Topics  . Smoking status: Current Every Day Smoker -- 0.50 packs/day for 6 years    Types: Cigarettes  . Smokeless tobacco: Never Used     Comment: smokes 1 cigarette a day  . Alcohol Use: No  . Drug Use: No  . Sexual Activity: Not Currently    Birth Control/ Protection: None   Other Topics Concern  . None   Social History Narrative   Patient is right handed.   Patient drinks one cup of coffee daily.   Additional History:    Sleep: fair Appetite:  Improved    Assessment:   Musculoskeletal: Strength & Muscle Tone: decreased Gait & Station: normal Patient leans: N/A   Psychiatric Specialty Exam: Physical Exam  ROS- no chest pain, no SOB, no nausea or vomiting, one seizure like activity  On unit this AM. ( See above )  RUQ discomfort reported .  Blood pressure 128/72, pulse 63, temperature 97.8 F (36.6 C), temperature source Oral, resp. rate 18, height 5\' 6"  (1.676 m), weight 230 lb (104.327 kg), last menstrual period 05/02/2015, SpO2  100 %, not currently breastfeeding.Body mass index is 37.14 kg/(m^2).  General Appearance: Fairly Groomed  Patent attorney::  Good  Speech:  slow  Volume:  Soft   Mood:  Depressed   Affect:  Constricted   Thought Process:  Linear   Orientation:  Full (Time, Place, and Person)  Thought Content:  No hallucinations, no delusions, ruminative about stressors, particularly health issues   Suicidal Thoughts:  No- at this time denies any thoughts  of hurting self or anyone else   Homicidal Thoughts:  No  Memory: recent  And remote grossly intact   Judgement:  Intact  Insight:  Fair  Psychomotor Activity:  Decreased   Concentration:  Good  Recall:  Good  Fund of Knowledge:Good  Language: Good  Akathisia:  Negative  Handed:  Right  AIMS (if indicated):     Assets:  Communication Skills Desire for Improvement Financial Resources/Insurance Housing Intimacy Leisure Time Physical Health Resilience Social Support Talents/Skills Transportation Vocational/Educational  ADL's:  Intact  Cognition: WNL  Sleep:  Number of Hours: 4.5     Current Medications: Current Facility-Administered Medications  Medication Dose Route Frequency Provider Last Rate Last Dose  . acetaminophen (TYLENOL) tablet 650 mg  650 mg Oral Q6H PRN Sanjuana Kava, NP   650 mg at 05/13/15 1316  . alum & mag hydroxide-simeth (MAALOX/MYLANTA) 200-200-20 MG/5ML suspension 30 mL  30 mL Oral Q4H PRN Sanjuana Kava, NP      . ammonia inhalant           . carbamazepine (TEGRETOL) tablet 200 mg  200 mg Oral BID Craige Cotta, MD   200 mg at 05/13/15 1315  . clonazePAM (KLONOPIN) tablet 0.5 mg  0.5 mg Oral TID PRN Leata Mouse, MD   0.5 mg at 05/13/15 1316  . cyclobenzaprine (FLEXERIL) tablet 10 mg  10 mg Oral BID Sanjuana Kava, NP   10 mg at 05/13/15 1314  . DULoxetine (CYMBALTA) DR capsule 60 mg  60 mg Oral Daily Craige Cotta, MD   60 mg at 05/13/15 1314  . ferrous sulfate tablet 325 mg  325 mg Oral BID WC Sanjuana Kava, NP   Stopped at 05/13/15 0900  . hydrOXYzine (ATARAX/VISTARIL) tablet 25 mg  25 mg Oral Q6H PRN Leata Mouse, MD   25 mg at 05/13/15 0148  . hydrOXYzine (ATARAX/VISTARIL) tablet 50 mg  50 mg Oral Once Thermon Leyland, NP   50 mg at 05/09/15 1753  . lidocaine (LIDODERM) 5 % 1 patch  1 patch Transdermal Q24H Sanjuana Kava, NP   1 patch at 05/11/15 2223  . magnesium hydroxide (MILK OF MAGNESIA) suspension 30 mL  30 mL Oral Daily PRN Sanjuana Kava, NP      . pantoprazole (PROTONIX) EC tablet 40 mg  40 mg Oral BID AC Sanjuana Kava, NP   40 mg at 05/13/15 0631  . topiramate (TOPAMAX) tablet 100 mg  100 mg Oral Daily Leata Mouse, MD   100 mg at 05/13/15 1315  . traZODone (DESYREL) tablet 150 mg  150 mg Oral QHS PRN Craige Cotta, MD   150 mg at 05/12/15 2158    Lab Results:  Results for orders placed or performed during the hospital encounter of 05/09/15 (from the past 48 hour(s))  CBG monitoring, ED     Status: None   Collection Time: 05/13/15  8:25 AM  Result Value Ref Range   Glucose-Capillary 90 65 - 99 mg/dL  CBC with Differential/Platelet     Status: Abnormal   Collection Time: 05/13/15  9:29 AM  Result Value Ref Range   WBC 6.1 4.0 - 10.5 K/uL   RBC 4.21 3.87 - 5.11 MIL/uL   Hemoglobin 10.6 (L) 12.0 - 15.0 g/dL   HCT 16.1 (L) 09.6 - 04.5 %   MCV 80.0 78.0 - 100.0 fL   MCH 25.2 (L) 26.0 - 34.0 pg   MCHC 31.5 30.0 - 36.0  g/dL   RDW 71.6 (H) 96.7 - 89.3 %   Platelets 190 150 - 400 K/uL    Comment: REPEATED TO VERIFY   Neutrophils Relative % 45 43 - 77 %   Lymphocytes Relative 39 12 - 46 %   Monocytes Relative 14 (H) 3 - 12 %   Eosinophils Relative 2 0 - 5 %   Basophils Relative 0 0 - 1 %   Neutro Abs 2.7 1.7 - 7.7 K/uL   Lymphs Abs 2.4 0.7 - 4.0 K/uL   Monocytes Absolute 0.9 0.1 - 1.0 K/uL   Eosinophils Absolute 0.1 0.0 - 0.7 K/uL   Basophils Absolute 0.0 0.0 - 0.1 K/uL   RBC Morphology POLYCHROMASIA PRESENT    Smear Review PLATELET CLUMPS NOTED  ON SMEAR   Basic metabolic panel     Status: Abnormal   Collection Time: 05/13/15  9:29 AM  Result Value Ref Range   Sodium 139 135 - 145 mmol/L   Potassium 3.4 (L) 3.5 - 5.1 mmol/L   Chloride 109 101 - 111 mmol/L   CO2 21 (L) 22 - 32 mmol/L   Glucose, Bld 76 65 - 99 mg/dL   BUN 12 6 - 20 mg/dL   Creatinine, Ser 8.10 0.44 - 1.00 mg/dL   Calcium 8.9 8.9 - 17.5 mg/dL   GFR calc non Af Amer >60 >60 mL/min   GFR calc Af Amer >60 >60 mL/min    Comment: (NOTE) The eGFR has been calculated using the CKD EPI equation. This calculation has not been validated in all clinical situations. eGFR's persistently <60 mL/min signify possible Chronic Kidney Disease.    Anion gap 9 5 - 15  I-Stat CG4 Lactic Acid, ED     Status: Abnormal   Collection Time: 05/13/15 10:00 AM  Result Value Ref Range   Lactic Acid, Venous 2.24 (HH) 0.5 - 2.0 mmol/L   Comment NOTIFIED PHYSICIAN     Physical Findings: AIMS:  , ,  ,  ,    CIWA:    COWS:      Assessment -  Patient had seizure like episode as described above earlier today. She has been diagnosed with pseudo-seizures . She , and her mother, report significant concern and frustration about these episodes, how crippling they have been for her, and lack of " knowing what to do to make them stop". Patient is clinically depressed, and today tearful, sad, and has reported history of mood instability, episodes of hypomania.  Not suicidal today. Responsive to support, empathy, counseling.  Treatment Plan Summary: Daily contact with patient to assess and evaluate symptoms and progress in treatment and Medication management  Treatment Plan/Recommendations:  Topamax 100 mgrs for management of headaches  Continue  Cymbalta  60 mgrs QDAY  For depression Continue Tegretol 200 mgrs BID for mood disorder  Continue Trazodone   150 mgrs QHS  To address ongoing insomnia Hospitalist Consult requested  To address patient's concerns .  Continue Working on disposition  planning with treatment team. On Protonix to address/prevent GERD symptoms     Medical Decision Making:  Established Problem, Stable/Improving (1), Review of Psycho-Social Stressors (1), Review or order clinical lab tests (1), Review of Medication Regimen & Side Effects (2) and Review of New Medication or Change in Dosage (2)     Honora Searson 05/13/2015, 3:58 PM

## 2015-05-13 NOTE — Progress Notes (Signed)
D: Pt is requesting an update on any recent medication changes. Pt was informed about her new orders for Tegretol which started this afternoon. Pt was also informed of the frequency change of her Cymbalta and the increased dose of her Trazodone. Pt denies any SI. Pt is visible and active within the milieu.  A: Writer administered prn medications to pt, per MD orders. Continued support and availability as needed was extended to this pt. Staff continue to monitor pt with q50min checks.  R: No adverse drug reactions noted. Pt receptive to treatment. Pt remains safe at this time.

## 2015-05-13 NOTE — Progress Notes (Signed)
Recreation Therapy Notes  Animal-Assisted Activity (AAA) Program Checklist/Progress Notes Patient Eligibility Criteria Checklist & Daily Group note for Rec Tx Intervention  Date: 06.07.16 Time: 2:30pm Location: [address]   AAA/T Program Assumption of Risk Form signed by Patient/ or Parent Legal Guardian yes  Patient is free of allergies or sever asthma yes  Patient reports no fear of animals yes  Patient reports no history of cruelty to animals yes  Patient understands his/her participation is voluntary yes  Patient washes hands before animal contact yes  Patient washes hands after animal contact yes  Education: Hand Washing, Appropriate Animal Interaction   Education Outcome: Acknowledges understanding/In group clarification offered/Needs additional education.   Clinical Observations/Feedback: Patient did not attend group.   Victorino Sparrow, LRT/CTRS         Victorino Sparrow A 05/13/2015 4:11 PM

## 2015-05-13 NOTE — Telephone Encounter (Signed)
Discharge letter was returned unclaimed. Letter has been resent to the pt via [employer]. Copy of envelope has been sent to be scanned.

## 2015-05-13 NOTE — ED Notes (Signed)
Patient transferred from [facility] with complaint of seizure activity reported by staff. No incontinence noted.

## 2015-05-13 NOTE — Plan of Care (Signed)
Problem: Ineffective individual coping Goal: LTG: Patient will report a decrease in negative feelings Outcome: Progressing Pt stated she was feeling ok today, pt said she was fine Goal: STG: Pt will be able to identify effective and ineffective STG: Pt will be able to identify effective and ineffective coping patterns  Outcome: Progressing Pt remains safe on the unit

## 2015-05-13 NOTE — ED Notes (Signed)
Bed: WA02 Expected date:  Expected time:  Means of arrival:  Comments: Seizure

## 2015-05-13 NOTE — Progress Notes (Signed)
D: Pt denies SI/HI/AVH. Pt is pleasant and cooperative. Pt has a matter of fact attitude. Pt does not interact with Probation officer much.   A: Pt was offered support and encouragement. Pt was given scheduled medications. Pt was encourage to attend groups. Q 15 minute checks were done for safety.   R:Pt attends groups and interacts well with peers and staff. Pt is taking medication. Pt receptive to treatment and safety maintained on unit.

## 2015-05-13 NOTE — ED Notes (Signed)
ISTAT [place] 2.24 Rn Jakeema B and Dr Jeneen Rinks Notified

## 2015-05-13 NOTE — Consult Note (Signed)
Consult Reason for Consult:spells Referring Physician: Dr Roselyn Bering ED  CC: recurrent spells  HPI: Penny [name] is an 29 y.o. 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 I reviewed this outside report which recorded two spells with generalized abnormal posturing and no EEG change.   She was transferred over from [place] after having multiple spells which are her typical episode. Per her mother, her episodes today are consistent with her prior events, including the ones that were captured at [facility] and determined to be non-epileptic in nature. Currently at baseline, reporting a severe headache, otherwise unremarkable. Patient and mother express frustration over lack of diagnosis and treatment.   During visit patient had one of her typical spells. Eyes closed, head turned to left, made grunting noises. Arms and legs extended out, tense. Would close eyes when attempted to open, they did appear midline with no gaze deviation. Lasted < 1 min and then patient returned to baseline.   Past Medical History  Diagnosis Date  . Migraines   . Interstitial cystitis   . IUD FEB 2010  . Gastritis JULY 2011  . Depression   . 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 Fatty liver   . Obesity (BMI 30-39.9) 2011 228 LBS BMI 36.8  . Iron deficiency anemia 07/23/2010  . Irritable bowel syndrome 2012 DIARRHEA    JUN 2012 TTG IgA 14.9  . Elevated liver enzymes JUL 2011ALK PHOS 111-127 AST  143-267 ALT  213-321T BILI 0.6  ALB  3.7-4.06 Jun 2011 ALK PHOS 118 AST 24 ALT 42 T BILI 0.4 ALB 3.9  . Chronic daily headache   . Ovarian cyst   . ADD (attention deficit disorder)   . RLQ abdominal pain 07/18/2013  . Menorrhagia 07/18/2013  . Anxiety   . Potassium (K) deficiency   . Dysmenorrhea 09/18/2013  . Stress 09/18/2013  . Patient desires pregnancy 09/18/2013  . Pregnant 12/25/2013  . Blood transfusion without reported diagnosis   .  Heart murmur   . Gastric bypass status for obesity   . 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 Dysrhythmia   . Sciatica of left side 11/14/2014  . Fibromyalgia   . 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 PONV (postoperative nausea and vomiting) seizure post-operatively    Past Surgical History  Procedure Laterality Date  . Gab  2007    in [place]-POUCH 5 CM  . Cholecystectomy  2005    biliary dyskinesia  . Cath self every nite      for sodium bicarb injection (discontinued 2013)  . Colonoscopy  JUN 2012 ABD PN/DIARRHEA WITH PROPOFOL    NL COLON  . Upper gastrointestinal endoscopy  JULY 2011 NAUSEA-D125,V6, PH 25    Bx; GASTRITIS, POUCH-5 CM LONG  . Dilation and curettage of uterus    . Tonsillectomy    . Gastric bypass  06/2006  . Savory dilation  06/20/2012    Dr. Barnie Alderman gastritis/Ulcer in the mid jejunum. Empiric dilation.   . Tonsillectomy and adenoidectomy    . Esophagogastroduodenoscopy    . Wisdom tooth extraction    . Repair vaginal cuff N/A 07/30/2014    Procedure: REPAIR VAGINAL CUFF;  Surgeon: Mora Bellman, MD;  Location: [facility];  Service: Gynecology;  Laterality: N/A;  . Hysteroscopy w/d&c N/A 09/12/2014    Procedure: DILATATION AND CURETTAGE /HYSTEROSCOPY;  Surgeon: Jonnie Kind, MD;  Location:  [facility];  Service: Gynecology;  Laterality: N/A;  . Esophagogastroduodenoscopy (egd) with propofol N/A 04/10/2015    Procedure: ESOPHAGOGASTRODUODENOSCOPY (EGD) WITH PROPOFOL;  Surgeon: Daneil Dolin, MD;  Location: [facility];  Service: Endoscopy;  Laterality: N/A;  . Esophageal dilation N/A 04/10/2015    Procedure: ESOPHAGEAL DILATION WITH 54FR MALONEY DILATOR;  Surgeon: Daneil Dolin, MD;  Location: [facility];  Service: Endoscopy;  Laterality: N/A;  . Esophageal biopsy  04/10/2015    Procedure: BIOPSY;  Surgeon: Daneil Dolin, MD;  Location: [facility];  Service: Endoscopy;;    Family History  Problem Relation Age of Onset  . Hemochromatosis Maternal  Grandmother   . Migraines Maternal Grandmother   . Cancer Maternal Grandmother   . Breast cancer Maternal Grandmother   . Hypertension Father   . Diabetes Father   . Coronary artery disease Father   . Migraines Paternal Grandmother   . Breast cancer Paternal Grandmother   . Cancer Mother     breast  . Hemochromatosis Mother   . Breast cancer Mother   . Coronary artery disease Paternal Grandfather     Social History:  reports that she has been smoking Cigarettes.  She has a 3 pack-year smoking history. She has never used smokeless tobacco. She reports that she does not drink alcohol or use illicit drugs.  Allergies  Allergen Reactions  . Gabapentin Other (See Comments)    Pt states that she was unresponsive after taking this medication, but her vitals remained stable.    . Metoclopramide Hcl Anxiety and Other (See Comments)    Pt states that she felt like she was trapped in a box, and could not get out.  Pt also states that she had temporary loss of movement, weakness, and tingling.    . Codeine Nausea And Vomiting  . Zofran [Ondansetron] Other (See Comments)    Migraines   . Latex Rash  . Tape Itching and Rash    Please use paper tape    Medications: I have reviewed the patient's current medications.   ROS: Out of a complete 14 system review, the patient complains of only the following symptoms, and all other reviewed systems are negative. +headache, anxiety  Physical Examination: Filed Vitals:   05/13/15 1050  BP: 128/72  Pulse: 63  Temp:   Resp: 18   Physical Exam  Constitutional: He appears well-developed and well-nourished.  Psych: Affect appropriate to situation Eyes: No scleral injection HENT: No OP obstrucion Head: Normocephalic.  Cardiovascular: Normal rate and regular rhythm.  Respiratory: Effort normal and breath sounds normal.  GI: Soft. Bowel sounds are normal. No distension. There is no tenderness.  Skin: WDI  Neurologic Examination Mental  Status: Alert, oriented, thought content appropriate.  Speech fluent without evidence of aphasia.  Able to follow 3 step commands without difficulty. Cranial Nerves: II: funduscopic exam wnl bilaterally, visual fields grossly normal, pupils equal, round, reactive to light and accommodation III,IV, VI: ptosis not present, extra-ocular motions intact bilaterally V,VII: smile symmetric, facial light touch sensation normal bilaterally VIII: hearing normal bilaterally IX,X: gag reflex present XI: trapezius strength/neck flexion strength normal bilaterally XII: tongue strength normal  Motor: Right : Upper extremity    Left:     Upper extremity 5/5 deltoid       5/5 deltoid 5/5 biceps      5/5 biceps  5/5 triceps      5/5 triceps 5/5 hand grip      5/5 hand grip  Lower extremity  Lower extremity 5/5 hip flexor      5/5 hip flexor 5/5 quadricep      5/5 quadriceps  5/5 hamstrings     5/5 hamstrings 5/5 plantar flexion       5/5 plantar flexion 5/5 plantar extension     5/5 plantar extension Tone and bulk:normal tone throughout; no atrophy noted Sensory: Pinprick and light touch intact throughout, bilaterally Deep Tendon Reflexes: 2+ and symmetric throughout Plantars: Right: downgoing   Left: downgoing Cerebellar: normal finger-to-nose, normal rapid alternating movements and normal heel-to-shin test Gait: normal gait and station  Laboratory Studies:   Basic Metabolic Panel:  Recent Labs Lab 05/09/15 0153 05/09/15 0757 05/13/15 0929  NA 140 141 139  K 3.9 3.3* 3.4*  CL 107 112* 109  CO2  --  20* 21*  GLUCOSE 90 87 76  BUN _0 CREATININE 0.70 0.69 0.61  CALCIUM  --  8.7* 8.9    Liver Function Tests:  Recent Labs Lab 05/09/15 0757  AST 24  ALT 41  ALKPHOS 90  BILITOT 0.5  PROT 6.8  ALBUMIN 4.0   No results for input(s): LIPASE, AMYLASE in the last 168 hours. No results for input(s): AMMONIA in the last 168 hours.  CBC:  Recent Labs Lab 05/09/15 0146  05/09/15 0153 05/13/15 0929  WBC 6.6  --  6.1  NEUTROABS 3.2  --  2.7  HGB 11.4* 12.9 10.6*  HCT 35.5* 38.0 33.7*  MCV 78.7  --  80.0  PLT 260  --  190    Cardiac Enzymes: No results for input(s): CKTOTAL, CKMB, CKMBINDEX, TROPONINI in the last 168 hours.  BNP: Invalid input(s): POCBNP  CBG:  Recent Labs Lab 05/09/15 0121 05/09/15 0217 05/13/15 0825  GLUCAP 99 102* 75    Microbiology: Results for orders placed or performed during the hospital encounter of 04/07/15  Clostridium Difficile by PCR     Status: None   Collection Time: 04/07/15  7:51 PM  Result Value Ref Range Status   C difficile by pcr NEGATIVE NEGATIVE Final    Coagulation Studies: No results for input(s): LABPROT, INR in the last 72 hours.  Urinalysis: No results for input(s): COLORURINE, LABSPEC, PHURINE, GLUCOSEU, HGBUR, BILIRUBINUR, KETONESUR, PROTEINUR, UROBILINOGEN, NITRITE, LEUKOCYTESUR in the last 168 hours.  Invalid input(s): APPERANCEUR  Lipid Panel:     Component Value Date/Time   CHOL 95 03/10/2012 0642   TRIG 50 03/10/2012 0642   HDL 38* 03/10/2012 0642   CHOLHDL 2.5 03/10/2012 0642   VLDL 10 03/10/2012 0642   LDLCALC 47 03/10/2012 0642    HgbA1C:  Lab Results  Component Value Date   HGBA1C 5.7* 01/15/2015    Urine Drug Screen:     Component Value Date/Time   LABOPIA NONE DETECTED 05/09/2015 0240   LABOPIA NEG 02/18/2014 1234   COCAINSCRNUR NONE DETECTED 05/09/2015 0240   COCAINSCRNUR NEG 02/18/2014 1234   LABBENZ NONE DETECTED 05/09/2015 0240   LABBENZ NEG 02/18/2014 1234   AMPHETMU NONE DETECTED 05/09/2015 0240   AMPHETMU NEG 02/18/2014 1234   THCU NONE DETECTED 05/09/2015 0240   LABBARB NONE DETECTED 05/09/2015 0240    Alcohol Level: No results for input(s): ETH in the last 168 hours.  Other results:  Imaging: No results found.   Assessment/Plan:  29 yo F with previously documented psychogenic seizures by prolonged EEG admitted with recurrent spells.  Witnessed episode in ED which mom states was her typical spell. Presentation appeared inconsistent with true epileptic seizure. Had  extensive discussion with patient and her mother over their concerns. We discussed results of prior video-EEG which captured episodes similar to today. They do not wish to proceed with further video-EEG at this time, as this is unlikely to provide further information I think this is prudent.   Overall she would most benefit from sustained outpatient psychological therapy.   1) Contacted patients outpatient neurologist, Dr [name], to schedule follow up for further migraine management. They will contact patient to schedule appointment  2) Contacted multiple psychiatrists to attempt to schedule an outpatient evaluation. They all require insurance/financial information prior to scheduling appointment. Referral has been placed in Epic. Suggest patient contact following numbers to schedule an appointment: [phone] Dr Norma Fredrickson [phone] [facility]   Jim Like, DO [facility] [phone]  If [place], please page neurology on call as listed in [place]. 05/13/2015, 10:51 AM

## 2015-05-13 NOTE — Progress Notes (Signed)
[facility] called at [phone] and [facility] at [phone] to notify that patient would be transferred.

## 2015-05-13 NOTE — Progress Notes (Signed)
Patient ID: Penny [name], female   DOB: 04-Sep-1986, 29 y.o.   MRN: [id number]  Patient received a urine specimen cup however patient has not returned it with a specimen at this time.

## 2015-05-13 NOTE — Telephone Encounter (Signed)
Reviewed

## 2015-05-14 LAB — RAPID URINE DRUG SCREEN, HOSP PERFORMED
AMPHETAMINES: NOT DETECTED
Barbiturates: NOT DETECTED
Benzodiazepines: POSITIVE — AB
COCAINE: NOT DETECTED
Opiates: NOT DETECTED
Tetrahydrocannabinol: NOT DETECTED

## 2015-05-14 NOTE — Progress Notes (Signed)
Patient ID: Penny [name], female   DOB: [birth year], 29 y.o.   MRN: [id number] [facility] MD Progress Note  05/14/2015 5:24 PM Penny [name]  MRN:  [id number] Subjective:   Patient states that she feels sore and physically very tired after yesterday's events. (Seizure like episode). She states she's spent most of the day in bed but has been up for meals and medications. She is minimizing depression at this time and hoping to be D/C soon.  She is tolerating Tegretol well thus far.   Objective : I have discussed case with treatment team and have met with patient. Today patient has had no seizure like episodes. She has been spending most of the day in bed and has noted reports having muscular soreness (diffuse) which she attributes to her seizure yesterday. She presents vaguely drowsy but able to participate during our session. She is fully oriented except for month (but knew the day of the week and year). Recall 3/3 immediate and 1/3 at 5 minutes.  Patient minimizes depression although affect reflects subdued and constricted. She states she is wanting to discharge soon so she can reunite w/ family. She is future oriented and has a specific plan with following up w/ her neurologist in addition to a psychiatrist and going to [facility] for another opinion/assessment.  We have reviewed her psychiatric history and she does endorse a history of distinct episodes of increased energy where she stays up 2 or 3 days and has more energy than usually spending hours cleaning the house. Therefore, it is felt that Tegretol is indicated to address Bipolar Spectrum Disorder. We have again reviewed medication side effects to include the terotogenic side effects potential interaction with other medications and potential for rash and blood dyscrasias.  No disruptive behavior on unit.     Principal Problem: Major depressive disorder, recurrent episode, severe with peripartum onset Diagnosis:   Patient Active  Problem List   Diagnosis Date Noted  . Major depressive disorder, recurrent episode, severe with peripartum onset [F33.2] 05/10/2015  . Post partum depression [F53] 05/09/2015  . Anastomotic ulcer [K28.9]   . Dysphagia [R13.10]   . Hematemesis [K92.0] 04/07/2015  . Intractable vomiting with nausea [R11.10] 04/07/2015  . Elevated liver enzymes [R74.8]   . Epigastric pain [R10.13]   . Abdominal pain [R10.9]   . Microcytic anemia [D50.9]   . 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 Seizure-like activity [R56.9] 02/22/2015  . Fibromyalgia [M79.7] 02/18/2015  . Vulvar fissure [N90.89] 02/18/2015  . Current smoker [Z72.0] 01/29/2015  . Hereditary and idiopathic peripheral neuropathy [G60.9] 01/15/2015  . Leg weakness, bilateral [R29.898] 01/02/2015  . Gait disorder [R26.9] 01/02/2015  . Cervicalgia [M54.2] 12/18/2014  . Sciatica of left side [M54.32] 11/14/2014  . 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 Abdominal pain, right lower quadrant [R10.31] 08/22/2014  . Headache, migraine [G43.909] 08/19/2014  . Seizure [R56.9] 08/18/2014  . Encephalopathy [G93.40] 08/13/2014  . Headache [R51] 08/13/2014  . Altered mental status [R41.82] 08/13/2014  . Right leg numbness [R20.8] 07/31/2014  . Previous gastric bypass affecting pregnancy, antepartum [O99.840] 05/22/2014  . 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 Depression complicating pregnancy in second trimester, antepartum [D22.025, F32.9] 05/09/2014  . Rapid palpitations [R00.2] 02/18/2014  . H/O maternal third degree perineal laceration, currently pregnant [O09.299] 02/18/2014  . Maternal iron deficiency anemia complicating pregnancy [K27.062, D50.9] 01/16/2014  . Restless legs [G25.81] 01/16/2014  . Chronic interstitial cystitis [N30.10] 10/23/2013  . Menorrhagia [N92.0] 07/18/2013  . h/o  Opiate addiction [F11.20] 03/11/2012    Class: Acute  . Passive suicidal ideations [R45.851] 03/10/2012    Class: Acute  .  History of migraine headaches [Z86.69] 03/10/2012    Class: Acute  . Depression with anxiety [F41.8] 03/10/2012    Class: Chronic  . 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 Pelvic congestion syndrome [N94.89] 10/13/2011  . Coitalgia [N94.1] 10/13/2011  . Chronic migraine without aura [G43.709] 10/13/2011  . IBS (irritable bowel syndrome) [K58.9] 08/25/2011  . Diarrhea [R19.7] 05/27/2011  . OBESITY, UNSPECIFIED [E66.9] 09/17/2010  . LIVER FUNCTION TESTS, ABNORMAL, HX OF [Z86.39] 09/17/2010  . Iron deficiency anemia [D50.9] 07/23/2010   Total Time spent with patient: 25 minutes   Past Medical History:  Past Medical History  Diagnosis Date  . Migraines   . Interstitial cystitis   . IUD FEB 2010  . Gastritis JULY 2011  . Depression   . 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 Fatty liver   . Obesity (BMI 30-39.9) 2011 228 LBS BMI 36.8  . Iron deficiency anemia 07/23/2010  . Irritable bowel syndrome 2012 DIARRHEA    JUN 2012 TTG IgA 14.9  . Elevated liver enzymes JUL 2011ALK PHOS 111-127 AST  143-267 ALT  213-321T BILI 0.6  ALB  3.7-4.06 Jun 2011 ALK PHOS 118 AST 24 ALT 42 T BILI 0.4 ALB 3.9  . Chronic daily headache   . Ovarian cyst   . ADD (attention deficit disorder)   . RLQ abdominal pain 07/18/2013  . Menorrhagia 07/18/2013  . Anxiety   . Potassium (K) deficiency   . Dysmenorrhea 09/18/2013  . Stress 09/18/2013  . Patient desires pregnancy 09/18/2013  . Pregnant 12/25/2013  . Blood transfusion without reported diagnosis   . Heart murmur   . Gastric bypass status for obesity   . 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 Dysrhythmia   . Sciatica of left side 11/14/2014  . Fibromyalgia   . 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 PONV (postoperative nausea and vomiting) seizure post-operatively     Past Surgical History  Procedure Laterality Date  . Gab  2007    in [place]-POUCH 5 CM  . Cholecystectomy  2005    biliary dyskinesia  . Cath self every nite      for sodium bicarb injection (discontinued 2013)  . Colonoscopy  JUN 2012 ABD PN/DIARRHEA WITH PROPOFOL    NL COLON  . Upper gastrointestinal endoscopy  JULY 2011 NAUSEA-D125,V6, PH 25    Bx; GASTRITIS, POUCH-5 CM LONG  . Dilation and curettage of uterus    . Tonsillectomy    . Gastric bypass  06/2006  . Savory dilation  06/20/2012    Dr. Barnie Alderman gastritis/Ulcer in the mid jejunum. Empiric dilation.   . Tonsillectomy and adenoidectomy    . Esophagogastroduodenoscopy    . Wisdom tooth extraction    . Repair vaginal cuff N/A 07/30/2014    Procedure: REPAIR VAGINAL CUFF;  Surgeon: Mora Bellman, MD;  Location: [place] [facility];  Service: Gynecology;  Laterality: N/A;  . Hysteroscopy w/d&c N/A 09/12/2014    Procedure: DILATATION AND CURETTAGE /HYSTEROSCOPY;  Surgeon: Jonnie Kind, MD;  Location: [facility];  Service: Gynecology;  Laterality: N/A;  . Esophagogastroduodenoscopy (egd) with propofol N/A 04/10/2015    Procedure: ESOPHAGOGASTRODUODENOSCOPY (EGD) WITH PROPOFOL;  Surgeon: Daneil Dolin, MD;  Location: [facility];  Service: Endoscopy;  Laterality: N/A;  . Esophageal dilation N/A 04/10/2015    Procedure: ESOPHAGEAL DILATION WITH 54FR MALONEY DILATOR;  Surgeon: Daneil Dolin, MD;  Location: [facility];  Service: Endoscopy;  Laterality: N/A;  . Esophageal biopsy  04/10/2015    Procedure: BIOPSY;  Surgeon: Daneil Dolin, MD;  Location: [facility];  Service: Endoscopy;;   Family History:  Family History  Problem Relation Age of Onset  . Hemochromatosis Maternal Grandmother   . Migraines Maternal Grandmother   . Cancer Maternal Grandmother   . Breast cancer Maternal Grandmother   . Hypertension Father   . Diabetes Father   . Coronary artery disease Father   . Migraines Paternal Grandmother   . Breast cancer Paternal Grandmother   .  Cancer Mother     breast  . Hemochromatosis Mother   . Breast cancer Mother   . Coronary artery disease Paternal Grandfather    Social History:  History  Alcohol Use No     History  Drug Use No    History   Social History  . Marital Status: Single    Spouse Name: N/A  . Number of Children: 2  . Years of Education: some colle   Occupational History  .     Social History Main Topics  . Smoking status: Current Every Day Smoker -- 0.50 packs/day for 6 years    Types: Cigarettes  . Smokeless tobacco: Never Used     Comment: smokes 1 cigarette a day  . Alcohol Use: No  . Drug Use: No  . Sexual Activity: Not Currently    Birth Control/ Protection: None   Other Topics Concern  . None   Social History Narrative   Patient is right handed.   Patient drinks one cup of coffee daily.   Additional History:    Sleep: increased (somnolent) Appetite:  Improved    Assessment:   Musculoskeletal: Strength & Muscle Tone: decreased Gait & Station: normal Patient leans: N/A   Psychiatric Specialty Exam: Physical Exam  ROS- no chest pain, no SOB, no nausea or vomiting, no seizure like episodes today  Blood pressure 113/61, pulse 67, temperature 98.3 F (36.8 C), temperature source Oral, resp. rate 15, height $RemoveBe'5\' 6"'SXiScItSX$  (1.676 m), weight 230 lb (104.327 kg), last menstrual period 05/02/2015, SpO2 100 %, not currently breastfeeding.Body mass index is 37.14 kg/(m^2).  General Appearance: Fairly Groomed  Engineer, water::  Good  Speech:  slow  Volume:  Soft   Mood:  Denies feeling depressed, states mood "okay"  Affect:  Constricted   Thought Process:  Linear   Orientation:  Full (Time, Place, and Person)  Thought Content:  No hallucinations, no delusions, ruminative about stressors, particularly health issues   Suicidal Thoughts:  No- at this time denies any thoughts of hurting self or anyone else   Homicidal Thoughts:  No  Memory: recent  And remote grossly intact   Judgement:   Intact  Insight:  Fair  Psychomotor Activity:  Decreased   Concentration:  Good  Recall:  Good  Fund of Knowledge:Good  Language: Good  Akathisia:  Negative  Handed:  Right  AIMS (if indicated):     Assets:  Communication Skills Desire for Improvement Financial Resources/Insurance Housing Intimacy Leisure Time Physical Health Resilience Social Support Talents/Skills Transportation Vocational/Educational  ADL's:  Intact  Cognition: WNL  Sleep:  Number of Hours: 4.5     Current Medications: Current Facility-Administered Medications  Medication Dose Route Frequency Provider Last Rate Last Dose  . acetaminophen (TYLENOL) tablet 650 mg  650 mg Oral Q6H PRN Encarnacion Slates, NP   650 mg at 05/14/15 1658  . alum & mag hydroxide-simeth (MAALOX/MYLANTA) 200-200-20 MG/5ML suspension 30 mL  30 mL Oral Q4H PRN Encarnacion Slates, NP      . carbamazepine (TEGRETOL) tablet 200 mg  200 mg Oral BID Jenne Campus, MD   200 mg at 05/14/15 1657  . clonazePAM (KLONOPIN) tablet 0.5 mg  0.5 mg Oral TID PRN Ambrose Finland, MD   0.5 mg at 05/13/15 2239  . cyclobenzaprine (FLEXERIL) tablet 10 mg  10 mg Oral BID Encarnacion Slates, NP   10 mg at 05/14/15 0829  . DULoxetine (CYMBALTA) DR capsule 60 mg  60 mg Oral Daily Jenne Campus, MD   60 mg at 05/14/15 0829  . ferrous sulfate tablet 325 mg  325 mg Oral BID WC Encarnacion Slates, NP   325 mg at 05/14/15 1657  . hydrOXYzine (ATARAX/VISTARIL) tablet 25 mg  25 mg Oral Q6H PRN Ambrose Finland, MD   25 mg at 05/13/15 2239  . hydrOXYzine (ATARAX/VISTARIL) tablet 50 mg  50 mg Oral Once Niel Hummer, NP   50 mg at 05/09/15 1753  . lidocaine (LIDODERM) 5 % 1 patch  1 patch Transdermal Q24H Encarnacion Slates, NP   1 patch at 05/11/15 2223  . magnesium hydroxide (MILK OF MAGNESIA) suspension 30 mL  30 mL Oral Daily PRN Encarnacion Slates, NP      . pantoprazole (PROTONIX) EC tablet 40 mg  40 mg Oral BID AC Encarnacion Slates, NP   40 mg at 05/14/15 1656  .  topiramate (TOPAMAX) tablet 100 mg  100 mg Oral Daily Ambrose Finland, MD   100 mg at 05/14/15 0830  . traZODone (DESYREL) tablet 150 mg  150 mg Oral QHS PRN Jenne Campus, MD   150 mg at 05/13/15 2239    Lab Results:  Results for orders placed or performed during the hospital encounter of 05/09/15 (from the past 48 hour(s))  CBG monitoring, ED     Status: None   Collection Time: 05/13/15  8:25 AM  Result Value Ref Range   Glucose-Capillary 90 65 - 99 mg/dL  CBC with Differential/Platelet     Status: Abnormal   Collection Time: 05/13/15  9:29 AM  Result Value Ref Range   WBC 6.1 4.0 - 10.5 K/uL   RBC 4.21 3.87 - 5.11 MIL/uL   Hemoglobin 10.6 (L) 12.0 - 15.0 g/dL   HCT 33.7 (L) 36.0 - 46.0 %   MCV 80.0 78.0 - 100.0 fL   MCH 25.2 (L) 26.0 - 34.0 pg   MCHC 31.5 30.0 - 36.0 g/dL   RDW 21.1 (H) 11.5 - 15.5 %   Platelets 190 150 - 400 K/uL    Comment: REPEATED TO VERIFY   Neutrophils Relative % 45 43 - 77 %   Lymphocytes Relative 39 12 - 46 %   Monocytes Relative 14 (H) 3 - 12 %   Eosinophils Relative 2 0 - 5 %   Basophils Relative 0 0 - 1 %   Neutro Abs 2.7 1.7 - 7.7 K/uL   Lymphs Abs 2.4 0.7 - 4.0 K/uL   Monocytes Absolute 0.9 0.1 - 1.0 K/uL   Eosinophils Absolute 0.1 0.0 - 0.7 K/uL   Basophils Absolute 0.0 0.0 - 0.1 K/uL   RBC Morphology POLYCHROMASIA PRESENT  Smear Review PLATELET CLUMPS NOTED ON SMEAR   Basic metabolic panel     Status: Abnormal   Collection Time: 05/13/15  9:29 AM  Result Value Ref Range   Sodium 139 135 - 145 mmol/L   Potassium 3.4 (L) 3.5 - 5.1 mmol/L   Chloride 109 101 - 111 mmol/L   CO2 21 (L) 22 - 32 mmol/L   Glucose, Bld 76 65 - 99 mg/dL   BUN 12 6 - 20 mg/dL   Creatinine, Ser 0.61 0.44 - 1.00 mg/dL   Calcium 8.9 8.9 - 10.3 mg/dL   GFR calc non Af Amer >60 >60 mL/min   GFR calc Af Amer >60 >60 mL/min    Comment: (NOTE) The eGFR has been calculated using the CKD EPI equation. This calculation has not been validated in all clinical  situations. eGFR's persistently <60 mL/min signify possible Chronic Kidney Disease.    Anion gap 9 5 - 15  I-Stat CG4 Lactic Acid, ED     Status: Abnormal   Collection Time: 05/13/15 10:00 AM  Result Value Ref Range   Lactic Acid, Venous 2.24 (HH) 0.5 - 2.0 mmol/L   Comment NOTIFIED PHYSICIAN   Comprehensive metabolic panel     Status: None   Collection Time: 05/13/15  8:00 PM  Result Value Ref Range   Sodium 141 135 - 145 mmol/L   Potassium 4.1 3.5 - 5.1 mmol/L   Chloride 110 101 - 111 mmol/L   CO2 23 22 - 32 mmol/L   Glucose, Bld 93 65 - 99 mg/dL   BUN 13 6 - 20 mg/dL   Creatinine, Ser 0.73 0.44 - 1.00 mg/dL   Calcium 9.4 8.9 - 10.3 mg/dL   Total Protein 7.0 6.5 - 8.1 g/dL   Albumin 4.1 3.5 - 5.0 g/dL   AST 27 15 - 41 U/L   ALT 46 14 - 54 U/L   Alkaline Phosphatase 94 38 - 126 U/L   Total Bilirubin 0.6 0.3 - 1.2 mg/dL   GFR calc non Af Amer >60 >60 mL/min   GFR calc Af Amer >60 >60 mL/min    Comment: (NOTE) The eGFR has been calculated using the CKD EPI equation. This calculation has not been validated in all clinical situations. eGFR's persistently <60 mL/min signify possible Chronic Kidney Disease.    Anion gap 8 5 - 15    Comment: Performed at [facility]  Carbamazepine level, total     Status: Abnormal   Collection Time: 05/13/15  8:00 PM  Result Value Ref Range   Carbamazepine Lvl 2.4 (L) 4.0 - 12.0 ug/mL    Comment: Performed at [facility]  Urine rapid drug screen (hosp performed)not at [facility]     Status: Abnormal   Collection Time: 05/13/15 10:47 PM  Result Value Ref Range   Opiates NONE DETECTED NONE DETECTED   Cocaine NONE DETECTED NONE DETECTED   Benzodiazepines POSITIVE (A) NONE DETECTED   Amphetamines NONE DETECTED NONE DETECTED   Tetrahydrocannabinol NONE DETECTED NONE DETECTED   Barbiturates NONE DETECTED NONE DETECTED    Comment:        DRUG SCREEN FOR MEDICAL PURPOSES ONLY.  IF CONFIRMATION IS NEEDED FOR ANY PURPOSE,  NOTIFY LAB WITHIN 5 DAYS.        LOWEST DETECTABLE LIMITS FOR URINE DRUG SCREEN Drug Class       Cutoff (ng/mL) Amphetamine      1000 Barbiturate      200 Benzodiazepine   161 Tricyclics  300 Opiates          300 Cocaine          300 THC              50 Performed at [facility]     Physical Findings: AIMS:  , ,  ,  ,    CIWA:    COWS:      Assessment -  Patient presents tired, and reporting muscular aches diffusely after her seizure like episode yesterday. STates that this is typical for these episodes. Does endorse a clear history of hypomanic episodes and tolerating Tegretol well thus far. She is hoping for discharge soon, is future oriented, and is minimizing depression although still appears constricted in affect. No SI.   Treatment Plan Summary: Daily contact with patient to assess and evaluate symptoms and progress in treatment and Medication management  Treatment Plan/Recommendations:  Topamax 100 mgrs for management of headaches  Continue  Cymbalta  60 mgrs QDAY  For depression Continue Tegretol 200 mgrs BID for mood disorder  D/C Trazodone d/t hypersomnia   Continue Working on disposition planning with treatment team. As noted, patient is planning on following up with a neurologist as well as psychiatrist. On Protonix to address/prevent GERD symptoms     Medical Decision Making:  Established Problem, Stable/Improving (1), Review of Psycho-Social Stressors (1), Review or order clinical lab tests (1), Review of Medication Regimen & Side Effects (2) and Review of New Medication or Change in Dosage (2)     COBOS, FERNANDO 05/14/2015, 5:24 PM

## 2015-05-14 NOTE — B[facility] Group Notes (Signed)
[facility] LCSW Aftercare Discharge Planning Group Note   05/14/2015 11:13 AM    Participation Quality:  Patient invited to group but did not attend.  Addis Tuohy, Eulas Post

## 2015-05-14 NOTE — BHH Group Notes (Signed)
[facility] LCSW Group Therapy  Emotional Regulation 1:15 - 2:30 PM  05/14/2015 3:34 PM  Type of Therapy:  Group Therapy  Participation Level:  Did Not Attend Concha Pyo 05/14/2015, 3:34 PM

## 2015-05-14 NOTE — Progress Notes (Signed)
Recreation Therapy Notes  Date: 06.08.16 Time: 9:30am Location: [address] Group room  Group Topic: Stress Management  Goal Area(s) Addresses:  Patient will verbalize importance of using healthy stress management.  Patient will identify positive emotions associated with healthy stress management.   Intervention: Stress Management  Activity :  Guided Imagery.  LRT introduced and educated patients on stress management technique of guided imagery.  A script was used to deliver the technique to patients.  Patients were asked to follow script read by LRT to engage in practicing the stress management technique.  Education:  Stress Management, Discharge Planning.   Education Outcome: Acknowledges edcuation/In group clarification offered/Needs additional education  Clinical Observations/Feedback: Patient did not attend group.   Victorino Sparrow, LRT/CTRS         Ria Comment, Maripaz Mullan A 05/14/2015 2:27 PM

## 2015-05-14 NOTE — Progress Notes (Signed)
[place] Group Notes:  (Nursing/MHT/Case Management/Adjunct)  Date:  05/14/2015  Time:  9:38 PM   Type of Therapy:  Psychoeducational Skills  Participation Level:  Active  Participation Quality:  Appropriate  Affect:  Appropriate  Cognitive:  Appropriate  Insight:  Appropriate  Engagement in Group:  Engaged  Modes of Intervention:  Discussion  Summary of Progress/Problems: In tonights wrap up group Talulah said that her day went good, she slept for a good portion . Some thing that she feels will help her personal development will be to get involved more with the community. She said that for the most part she goes to work and home and doesn't really do much else. So being able to get out she feels will help her grow.  [name] 05/14/2015, 9:38 PM

## 2015-05-14 NOTE — Progress Notes (Signed)
Patient ID: Penny [name], female   DOB: Nov 22, 1986, 29 y.o.   MRN: [id number]  DAR: Pt. Denies SI/HI and A/V Hallucinations. Patient reports a headache and received PRN Tylenol. Support and encouragement provided to the patient to continue with plan of care. Patient reports she is feeling somewhat tired today. Patient states, "I finally slept" last night. Scheduled medications administered to patient per physician's orders. Patient is minimal and forwards little. Patient has not been attending groups today and is mostly seen laying in her bed sleeping. Patient did not go to breakfast or lunch. Patient stated she was not hungry for breakfast. A tray will be brought back for lunch to encourage patient to eat. Patient's mood is depressed. Q15 minute checks are maintained for safety.

## 2015-05-15 LAB — BASIC METABOLIC PANEL
ANION GAP: 6 (ref 5–15)
BUN: 13 mg/dL (ref 6–20)
CO2: 24 mmol/L (ref 22–32)
CREATININE: 0.66 mg/dL (ref 0.44–1.00)
Calcium: 9 mg/dL (ref 8.9–10.3)
Chloride: 107 mmol/L (ref 101–111)
GFR calc Af Amer: >60 mL/min (ref >60)
GFR calc non Af Amer: >60 mL/min (ref >60)
Glucose, Bld: 97 mg/dL (ref 65–99)
POTASSIUM: 3.6 mmol/L (ref 3.5–5.1)
Sodium: 137 mmol/L (ref 135–145)

## 2015-05-15 LAB — CBC
HCT: 33.9 % — ABNORMAL LOW (ref 36.0–46.0)
Hemoglobin: 10.8 g/dL — ABNORMAL LOW (ref 12.0–15.0)
MCH: 25.7 pg — ABNORMAL LOW (ref 26.0–34.0)
MCHC: 31.9 g/dL (ref 30.0–36.0)
MCV: 80.7 fL (ref 78.0–100.0)
Platelets: 201 10*3/uL (ref 150–400)
RBC: 4.2 MIL/uL (ref 3.87–5.11)
RDW: 21.1 % — AB (ref 11.5–15.5)
WBC: 6.2 10*3/uL (ref 4.0–10.5)

## 2015-05-15 LAB — CARBAMAZEPINE LEVEL, TOTAL: CARBAMAZEPINE LVL: 5.9 ug/mL (ref 4.0–12.0)

## 2015-05-15 LAB — GLUCOSE, CAPILLARY: GLUCOSE-CAPILLARY: 74 mg/dL (ref 65–99)

## 2015-05-15 MED ORDER — HYDROXYZINE HCL 25 MG PO TABS: 25.0000 mg | ORAL_TABLET | Freq: Four times a day (QID) | ORAL | Status: DC | PRN

## 2015-05-15 MED ORDER — CARBAMAZEPINE 200 MG PO TABS: 200.0000 mg | ORAL_TABLET | Freq: Two times a day (BID) | ORAL | Status: DC

## 2015-05-15 MED ORDER — CLONAZEPAM 0.5 MG PO TABS
0.5000 mg | ORAL_TABLET | Freq: Once | ORAL | Status: AC
  Administered 2015-05-15: 0.5 mg via ORAL
  Filled 2015-05-15: qty 1

## 2015-05-15 MED ORDER — KETOROLAC TROMETHAMINE 30 MG/ML IJ SOLN
30.0000 mg | Freq: Once | INTRAMUSCULAR | Status: AC
  Administered 2015-05-15: 30 mg via INTRAVENOUS
  Filled 2015-05-15: qty 1

## 2015-05-15 MED ORDER — LORAZEPAM 2 MG/ML IJ SOLN
2.0000 mg | Freq: Once | INTRAMUSCULAR | Status: AC
  Administered 2015-05-15: 2 mg via INTRAMUSCULAR

## 2015-05-15 MED ORDER — ACETAMINOPHEN 500 MG PO TABS
1000.0000 mg | ORAL_TABLET | Freq: Once | ORAL | Status: AC
  Administered 2015-05-15: 1000 mg via ORAL
  Filled 2015-05-15: qty 2

## 2015-05-15 MED ORDER — LORAZEPAM 2 MG/ML IJ SOLN
INTRAMUSCULAR | Status: AC
  Administered 2015-05-15: 2 mg
  Filled 2015-05-15: qty 1

## 2015-05-15 MED ORDER — CYCLOBENZAPRINE HCL 10 MG PO TABS: 10.0000 mg | ORAL_TABLET | Freq: Two times a day (BID) | ORAL | Status: DC

## 2015-05-15 MED ORDER — LORAZEPAM 2 MG/ML IJ SOLN
INTRAMUSCULAR | Status: AC
  Filled 2015-05-15: qty 1

## 2015-05-15 MED ORDER — TOPIRAMATE 100 MG PO TABS: 100.0000 mg | ORAL_TABLET | Freq: Every day | ORAL | Status: DC

## 2015-05-15 MED ORDER — HYOSCYAMINE SULFATE 0.125 MG SL SUBL: 0.1250 mg | SUBLINGUAL_TABLET | SUBLINGUAL | Status: DC | PRN

## 2015-05-15 MED ORDER — PANTOPRAZOLE SODIUM 40 MG PO TBEC: 40.0000 mg | DELAYED_RELEASE_TABLET | Freq: Two times a day (BID) | ORAL | Status: DC

## 2015-05-15 MED ORDER — FERROUS SULFATE 325 (65 FE) MG PO TABS: 325.0000 mg | ORAL_TABLET | Freq: Two times a day (BID) | ORAL | Status: DC

## 2015-05-15 MED ORDER — TRAZODONE HCL 150 MG PO TABS: 150.0000 mg | ORAL_TABLET | Freq: Every evening | ORAL | Status: DC | PRN

## 2015-05-15 MED ORDER — DULOXETINE HCL 60 MG PO CPEP: 60.0000 mg | ORAL_CAPSULE | Freq: Every day | ORAL | Status: DC

## 2015-05-15 MED ORDER — LORAZEPAM 2 MG/ML IJ SOLN: 2.0000 mg | Freq: Once | INTRAMUSCULAR | Status: DC

## 2015-05-15 NOTE — ED Notes (Signed)
As typing previous note, pt had another seizure as previously described in prior note. Ativan given for seizure. IV x 2 attempted. Terri, RN (charge nurse aware).

## 2015-05-15 NOTE — Significant Event (Cosign Needed)
Responded to code blue alert. Patient was found unresponsive, lying face down on the floor next to her bed. Patient was contracted in a corticate position. No witness available prior to the suspected fall or incidents that preceeded. Attempted to awaken the patient with verbal and pain full stimuli without success. Airway patent, breathing regular and non labored, patient pale with extremeties cyanotic appearing. Maintained patient in current position as not too disturb C-spine. V/s obtained BG wnl, Sats low 90's, normotensive and pulse regular. Patient administered per my order Ativan 2 MG IM x one, supplemental O2 administered via [place] at 2l/min with improved Sats 100%. EMS was notified at time of initial encounter and updated when they presented. Patient was emergently taken to [facility] ED for further evaluation/intervention.    * Dr Parke Poisson Psychiatric Attending
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
## 2015-05-15 NOTE — Progress Notes (Signed)
Pt found on the floor (beside her bed) in prone position by Tech during the "q51min" check. "Duress" activated. Writer and additional supporting staff (including on-call extender) responded immediately to pt's bed side. Pt presented with seizure-like activity. Pt observed banging her head on the floor. Pt's eyes was closed. BUE were rigid.  Pt's feet was contracted. Pt's eyes were closed.  Pt was not responsive to touch or voice. Pt briefly appeared to have opened her eyes. Eyes never fully opened. Pt's vitals obtained. 2 mg Ativan administered. 2L/min of oxygen via nasal cannula. EMS called during the chain of events listed.  Writer attempted to call Pt's mother [name]. No answer.  Terri, Agricultural consultant notified of pt's transfer to [employer].

## 2015-05-15 NOTE — [facility] Notes (Signed)
[facility] Notes:  (Nursing/MHT/Case Management/Adjunct)  Date:  05/15/2015  Time: 0900 am  Type of Therapy:  Psychoeducational Skills  Participation Level:  Did Not Attend   Penny [name] 05/15/2015, 5:52 PM

## 2015-05-15 NOTE — ED Provider Notes (Signed)
CSN: [id number]     Arrival date & time 05/13/15  0827 History   First MD Initiated Contact with Patient 05/13/15 [phone]     Chief Complaint  Patient presents with  . Seizures     (Consider location/radiation/quality/duration/timing/severity/associated sxs/prior Treatment) HPI Comments: Here after having a seizure. She is currently in a psych hospital for pseudoseizures and depression. She's had a about of postpartum depression recently. She's been seen for pseudoseizures and multiple seizure episodes, one of which will always she was driving a car, over the past 2 weeks. She voluntarily signed and [place] a few days ago. She is seen by neurology twice in the past week. Neurology states patient needs Psychiatric evaluation.  Multiple seizures here.   Patient is a 29 y.o. female presenting with seizures. The history is provided by the patient.  Seizures Seizure activity on arrival: yes   Seizure type:  Focal Preceding symptoms: no dizziness   Initial focality:  None Episode characteristics: generalized shaking   Episode characteristics: no combativeness, no confusion, no disorientation, fully responsive and responsive   Return to baseline: no   Severity:  Moderate Context: emotional upset (stressed about being in the Psych hospital)   Context: not cerebral palsy and not fever     Past Medical History  Diagnosis Date  . Migraines   . Interstitial cystitis   . IUD FEB 2010  . Gastritis JULY 2011  . Depression   . 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 Fatty liver   . Obesity (BMI 30-39.9) 2011 228 LBS BMI 36.8  . Iron deficiency anemia 07/23/2010  . Irritable bowel syndrome 2012 DIARRHEA    JUN 2012 TTG IgA 14.9  . Elevated liver enzymes JUL 2011ALK PHOS 111-127 AST  143-267 ALT  213-321T BILI 0.6  ALB  3.7-4.06 Jun 2011 ALK PHOS 118 AST 24 ALT 42 T BILI 0.4 ALB 3.9  . Chronic daily headache   . Ovarian cyst   . ADD (attention deficit disorder)   . RLQ abdominal  pain 07/18/2013  . Menorrhagia 07/18/2013  . Anxiety   . Potassium (K) deficiency   . Dysmenorrhea 09/18/2013  . Stress 09/18/2013  . Patient desires pregnancy 09/18/2013  . Pregnant 12/25/2013  . Blood transfusion without reported diagnosis   . Heart murmur   . Gastric bypass status for obesity   . 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 Dysrhythmia   . Sciatica of left side 11/14/2014  . Fibromyalgia   . 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 PONV (postoperative nausea and vomiting) seizure post-operatively   Past Surgical History  Procedure Laterality Date  . Gab  2007    in [place]-POUCH 5 CM  . Cholecystectomy  2005    biliary dyskinesia  . Cath self every nite      for sodium bicarb injection (discontinued 2013)  . Colonoscopy  JUN 2012 ABD PN/DIARRHEA WITH PROPOFOL    NL COLON  . Upper gastrointestinal endoscopy  JULY 2011 NAUSEA-D125,V6, PH 25    Bx; GASTRITIS, POUCH-5 CM LONG  . Dilation and curettage of uterus    . Tonsillectomy    . Gastric bypass  06/2006  . Savory dilation  06/20/2012    Dr. Barnie Alderman gastritis/Ulcer in the mid jejunum. Empiric dilation.   . Tonsillectomy and adenoidectomy    . Esophagogastroduodenoscopy    . Wisdom tooth extraction    . Repair vaginal cuff N/A 07/30/2014  Procedure: REPAIR VAGINAL CUFF;  Surgeon: Mora Bellman, MD;  Location: [facility];  Service: Gynecology;  Laterality: N/A;  . Hysteroscopy w/d&c N/A 09/12/2014    Procedure: DILATATION AND CURETTAGE /HYSTEROSCOPY;  Surgeon: Jonnie Kind, MD;  Location: [facility];  Service: Gynecology;  Laterality: N/A;  . Esophagogastroduodenoscopy (egd) with propofol N/A 04/10/2015    Procedure: ESOPHAGOGASTRODUODENOSCOPY (EGD) WITH PROPOFOL;  Surgeon: Daneil Dolin, MD;  Location: [facility];  Service: Endoscopy;  Laterality: N/A;  . Esophageal dilation N/A 04/10/2015    Procedure: ESOPHAGEAL DILATION WITH 54FR MALONEY DILATOR;  Surgeon: Daneil Dolin, MD;  Location: [facility];  Service: Endoscopy;  Laterality: N/A;  . Esophageal biopsy  04/10/2015    Procedure: BIOPSY;  Surgeon: Daneil Dolin, MD;  Location: [facility];  Service: Endoscopy;;   Family History  Problem Relation Age of Onset  . Hemochromatosis Maternal Grandmother   . Migraines Maternal Grandmother   . Cancer Maternal Grandmother   . Breast cancer Maternal Grandmother   . Hypertension Father   . Diabetes Father   . Coronary artery disease Father   . Migraines Paternal Grandmother   . Breast cancer Paternal Grandmother   . Cancer Mother     breast  . Hemochromatosis Mother   . Breast cancer Mother   . Coronary artery disease Paternal Grandfather    History  Substance Use Topics  . Smoking status: Current Every Day Smoker -- 0.50 packs/day for 6 years    Types: Cigarettes  . Smokeless tobacco: Never Used     Comment: smokes 1 cigarette a day  . Alcohol Use: No   OB History    Gravida Para Term Preterm AB TAB SAB Ectopic Multiple Living   '3 2 1 1 1  1   2     '$ Review of Systems  Constitutional: Negative for fever and chills.  Respiratory: Negative for cough and shortness of breath.   Gastrointestinal: Negative for vomiting and abdominal pain.  Neurological: Positive for seizures.  All other systems reviewed and are negative.     Allergies  Gabapentin; Metoclopramide hcl; Codeine; Zofran; Latex; and Tape  Home Medications   Prior to Admission medications   Medication Sig Start Date End Date Taking? Authorizing Provider  carbamazepine (TEGRETOL) 200 MG tablet Take 1 tablet (200 mg total) by mouth 2 (two) times daily. 05/13/15   Tanna Furry, MD  clonazePAM (KLONOPIN) 0.5 MG tablet Take 1 tablet (0.5 mg total) by mouth 3 (three) times daily as needed for anxiety. 04/10/15   Radene Gunning, NP  cyclobenzaprine (FLEXERIL) 5 MG tablet TAKE 1 TABLET BY MOUTH EVERY EIGHT HOURS AS NEEDED FOR MUSCLE SPASMS. 03/17/15   Kathrynn Ducking, MD  cyproheptadine (PERIACTIN) 2 MG/5ML syrup Take 10 mLs  (4 mg total) by mouth 3 (three) times daily as needed (abd pain). Patient not taking: Reported on 05/09/2015 05/05/15   Elmyra Ricks Pisciotta, PA-C  DULoxetine (CYMBALTA) 30 MG capsule Take 1-2 capsules (30-60 mg total) by mouth 2 (two) times daily. Take 1 capsule (30 mg) every morning and 2 capsules (60 mg) every night 02/24/15   Albertine Patricia, MD  ferrous sulfate 325 (65 FE) MG tablet Take 1 tablet (325 mg total) by mouth 2 (two) times daily with a meal. Patient not taking: Reported on 04/22/2015 02/06/15   Okey Regal, PA-C  HYDROcodone-acetaminophen (NORCO/VICODIN) 5-325 MG per tablet Take 1 tablet by mouth every 6 (six) hours as needed. 04/24/15   Butch Penny, NP  hyoscyamine (LEVSIN SL) 0.125 MG SL tablet Place 1 tablet (0.125 mg total) under the tongue every 4 (four) hours as needed. Patient taking differently: Place 0.125 mg under the tongue every 4 (four) hours as needed for cramping.  04/17/15   Orvil Feil, NP  lidocaine (LIDODERM) 5 % Place 1 patch onto the skin daily. Remove & Discard patch within 12 hours or as directed by MD Patient taking differently: Place 1 patch onto the skin daily as needed (pain). Remove & Discard patch within 12 hours or as directed by MD 04/22/15   Butch Penny, NP  pantoprazole (PROTONIX) 40 MG tablet Take 1 tablet (40 mg total) by mouth 2 (two) times daily before a meal. 04/10/15   Radene Gunning, NP  promethazine (PHENERGAN) 25 MG suppository Place 1 suppository (25 mg total) rectally every 6 (six) hours as needed for nausea or vomiting. 05/05/15   Elmyra Ricks Pisciotta, PA-C  promethazine (PHENERGAN) 25 MG tablet Take 1 tablet (25 mg total) by mouth every 6 (six) hours as needed for nausea or vomiting. 04/15/15   Noemi Chapel, MD  sucralfate (CARAFATE) 1 GM/10ML suspension Take 10 mLs (1 g total) by mouth 4 (four) times daily -  with meals and at bedtime. Patient not taking: Reported on 04/22/2015 04/10/15   Radene Gunning, NP  topiramate (TOPAMAX) 100 MG tablet Take 1  tablet (100 mg total) by mouth at bedtime. 02/18/15   Kathrynn Ducking, MD  zolpidem (AMBIEN) 5 MG tablet Take 1 tablet (5 mg total) by mouth at bedtime as needed for sleep. 12/26/14   Kathyrn Drown, MD   BP 138/86 mmHg  Pulse 73  Temp(Src) 97.7 F (36.5 C) (Oral)  Resp 21  Ht $R'5\' 6"'PY$  (1.676 m)  Wt 230 lb (104.327 kg)  BMI 37.14 kg/m2  SpO2 96%  LMP 05/02/2015 Physical Exam  Constitutional: She is oriented to person, place, and time. She appears well-developed and well-nourished. No distress.  HENT:  Head: Normocephalic and atraumatic.  Mouth/Throat: Oropharynx is clear and moist.  Eyes: EOM are normal. Pupils are equal, round, and reactive to light.  Neck: Normal range of motion. Neck supple.  Cardiovascular: Normal rate and regular rhythm.  Exam reveals no friction rub.   No murmur heard. Pulmonary/Chest: Effort normal and breath sounds normal. No respiratory distress. She has no wheezes. She has no rales.  Abdominal: Soft. She exhibits no distension. There is no tenderness. There is no rebound.  Musculoskeletal: Normal range of motion. She exhibits no edema.  Neurological: She is alert and oriented to person, place, and time.  Skin: She is not diaphoretic.  Nursing note and vitals reviewed.   ED Course  Procedures (including critical care time) Labs Review Labs Reviewed  CBC WITH DIFFERENTIAL/PLATELET - Abnormal; Notable for the following:    Hemoglobin 10.6 (*)    HCT 33.7 (*)    MCH 25.2 (*)    RDW 21.1 (*)    Monocytes Relative 14 (*)    All other components within normal limits  BASIC METABOLIC PANEL - Abnormal; Notable for the following:    Potassium 3.4 (*)    CO2 21 (*)    All other components within normal limits  URINE RAPID DRUG SCREEN (HOSP PERFORMED) NOT AT [facility] - Abnormal; Notable for the following:    Benzodiazepines POSITIVE (*)    All other components within normal limits  CARBAMAZEPINE LEVEL, TOTAL - Abnormal; Notable for the following:     Carbamazepine Lvl  2.4 (*)    All other components within normal limits  I-STAT CG4 LACTIC ACID, ED - Abnormal; Notable for the following:    Lactic Acid, Venous 2.24 (*)    All other components within normal limits  COMPREHENSIVE METABOLIC PANEL  GLUCOSE, CAPILLARY  CBC  BASIC METABOLIC PANEL  CARBAMAZEPINE LEVEL, TOTAL  CBG MONITORING, ED    Imaging Review No results found.   EKG Interpretation   Date/Time:  Thursday May 15 2015 05:11:28 EDT Ventricular Rate:  72 PR Interval:  192 QRS Duration: 83 QT Interval:  367 QTC Calculation: 402 R Axis:   66 Text Interpretation:  Sinus rhythm Nonspecific T abnormalities, lateral  leads No significant change since last tracing Confirmed by Mingo Amber  MD,  [place] ([id number]) on 05/15/2015 5:19:53 AM      MDM   Final diagnoses:  Pseudoseizure    16F here with seizures. She's been in [employer] with depression. She signed in voluntarily a few days ago.  Hx of psychogenic non-epileptic seizures. This was diagnosed on a long EEG at [facility] in [place] in September of 2015.  Patient seen by Neuro twice in the past week, no further workup recommended by them. She was sent over from the psych unit for seizures. Notes from the psych unit reports she's not been spitting and group therapy and that she's had some bouts of depression which usually lead to seizures. She reports an oral flight and that stress normally worsens her seizures. Here she had multiple seizures. She will desaturate into the 80s then come right back up once a seizure is over. Seizures are lasting anywhere from 30 seconds to 1 minute. She received Ativan here for seizure control. I spoke with her about her diagnosis. She is aware of them being psychogenic in nature. She feels very stressed out a [employer]. I spoke with the providers at [facility]. She is up for discharge today because she signed a 72 hour discharge form recently. She is to be counseled and  then plan for discharge later today. I did send a carbamazepine level as they started her on this drug a few days ago. The psych hospital said they can follow-up on that later today to adjust dosing. Patient feels like leaving the [facility] will really help her stress levels. I spoke with Frederico Hamman the PA at the site hospital who according a follow-up of the carbamazepine level and for discharge today. I will feel more comfortable psych discharging or so she has everything lined up as far as outpatient follow-up and her medication needs. Patient is comfortable with this plan.    Evelina Bucy, MD 05/15/15 [phone]

## 2015-05-15 NOTE — ED Notes (Signed)
Bed: FP82 Expected date:  Expected time:  Means of arrival:  Comments: EMS seizure from [facility]

## 2015-05-15 NOTE — [facility] Group Notes (Signed)
[facility] LCSW Group Therapy Group Therapy:  [facility]  1:15 - 2: 30 PM   05/15/2015   Type of Therapy: Group Therapy  Participation Level: Appropriate  Participation Quality: Invited, chose not to attend, remained in bed.  Edwyna Shell, LCSW Clinical Social Worker 05/15/2015 1:57 PM

## 2015-05-15 NOTE — ED Notes (Addendum)
Patient is being transferred from [facility] to [facility] ED via EMS. Per EMS, pt was found face down in her bedroom tile floor. Staff assumes she had a seizure due to having a past history of seizures. Pt was given ATIVAN 2 mg IM at [facility]. Pt is post-ictal. Vital signs enroute BP: 120/82, HR: 95, O2 Sat: 98%, and blood sugar 74mg /dL

## 2015-05-15 NOTE — Progress Notes (Signed)
Patient ID: Penny [name], female   DOB: [birth year], 29 y.o.   MRN: [id number] Discharge note:  Patient discharged home per MD order.  Patient received all personal belongings, prescriptions and medication samples.  Reviewed discharge instructions and medication administration with patient and she indicated understanding.  She denies any SI/HI/AVH.  Patient left ambulatory with her mother.

## 2015-05-15 NOTE — ED Notes (Signed)
[facility] transportation called for pickup.

## 2015-05-15 NOTE — Progress Notes (Signed)
  [facility] Adult Case Management Discharge Plan :  Will you be returning to the same living situation after discharge:  Yes,  patient plans to return home At discharge, do you have transportation home?: Yes,  patient reports access to transportation home Do you have the ability to pay for your medications: Yes,  patient will be provided with prescriptions at discharge  Release of information consent forms completed and in the chart;  Patient's signature needed at discharge.  Patient to Follow up at: Follow-up Information    Follow up with [facility] On 05/19/2015.   Why:  Hospital discharge appointment on Monday June 13th at 9:15 am. Please call office if you need to reschedule.   Contact information:   [address] [place], [place]   [address] [phone]      Follow up with Maurice Small On 06/10/2015.   Why:  Tuesday, June 10, 2015 at [facility] information:   [address] [place], [place]   [address]  [phone]      Follow up with Dr. Harrington Challenger [facility].   Why:  You are scheduled with Dr. Harrington Challenger on Wednesday, June 25, 2015 at 9:45 AM for medication management.   Contact information:   [address], [place]      Follow up with [facility]- IOP program On 05/22/2015.   Why:  Appointment with Dellia Nims with Intensive Outpatient Program (IOP) on Thursday June 16th at 8:45 am. Please call office if you need to reschedule appointment.   Contact information:   [address] [place] [phone]      Follow up with Mickie Hillier, MD On 05/22/2015.   Why:  Appointment with PCP on Thursday June 16th at 1pm. Please call office if you need to reschedule.   Contact information:   [address] [place] [address] [phone]      Follow up with [facility] On 05/20/2015.   Why:  Appointment with Dr. [name] on Tuesday June 14th at 8:30 am. Please call if you need to reschedule.    Contact information:   [address]  [place], [place] [address] [phone]      Patient denies SI/HI: Yes,  denies    Safety Planning and Suicide Prevention discussed: Yes,  with patient and mother  Have you used any form of tobacco in the last 30 days? (Cigarettes, Smokeless Tobacco, Cigars, and/or Pipes): Yes  Has patient been referred to the Quitline?: Patient refused referral  Deann Mclaine, Casimiro Needle 05/15/2015, 11:56 AM

## 2015-05-15 NOTE — BHH Suicide Risk Assessment (Addendum)
[facility] Discharge Suicide Risk Assessment   Demographic Factors:  29 year old female, separated, two children, employed, mother closest support system.  Total Time spent with patient: 30 minutes  Musculoskeletal: Strength & Muscle Tone: within normal limits Gait & Station: normal Patient leans: N/A  Psychiatric Specialty Exam: Physical Exam  ROS  Blood pressure 104/52, pulse 82, temperature 97.6 F (36.4 C), temperature source Oral, resp. rate 14, height 5\' 6"  (1.676 m), weight 230 lb (104.327 kg), last menstrual period 05/02/2015, SpO2 99 %, not currently breastfeeding.Body mass index is 37.14 kg/(m^2).  General Appearance: Fairly Groomed  Engineer, water::  Good  Speech:  [phone]  Volume:  Normal  Mood:  denies feeling depressed, denies sadness, reports feeling " tired "  Affect:  blunted,  but does smile briefly at times   Thought Process:  Goal Directed and Linear  Orientation:  Full (Time, Place, and Person)   Thought Content:  denies hallucinations, no delusions, not internally preoccupied   Suicidal Thoughts:  No- patient denies any thoughts of hurting self or anyone else   Homicidal Thoughts:  No  Memory: recall 3/3 immediate , 1/3 at 5 minutes   Judgement:  Fair  Insight:  Present  Psychomotor Activity:  Decreased  Concentration:  Good  Recall:  Good  Fund of Knowledge:Good  Language: Good  Akathisia:  Negative  Handed:  Right  AIMS (if indicated):     Assets:  Desire for Improvement Resilience Social Support  Sleep:  Number of Hours: 4.5  Cognition: alert , attentive  ADL's:partially improved    Have you used any form of tobacco in the last 30 days? (Cigarettes, Smokeless Tobacco, Cigars, and/or Pipes): Yes  Has this patient used any form of tobacco in the last 30 days? (Cigarettes, Smokeless Tobacco, Cigars, and/or Pipes) Yes, A prescription for an FDA-approved tobacco cessation medication was offered at discharge and the patient refused  Mental Status Per Nursing  Assessment::   On Admission:     Current Mental Status by Physician: At this time patient is alert and attentive, but states she feels tired and drowsy following seizure like episode early this AM ( medically cleared at ER) . She denies depression, denies sadness, but affect presents blunted. No thought disorder, denies any suicidal ideations, denies any homicidal ideations, no psychotic symptoms, future oriented, wanting to return to work soon.  Loss Factors: Separation- onset of seizure like episodes, which have been frequent- have been diagnosed with Pseudoseizures . Historical Factors: History of Depression, has been diagnosed with Bipolar Disorder II based on history of episodes of hypomania . 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  Risk Reduction Factors:   Responsible for children under 66 years of age, Sense of responsibility to family, Living with another person, especially a relative, Positive social support and Positive coping skills or problem solving skills  Continued Clinical Symptoms:  Patient reports improvement, and regarding her mood denies depression or sadness. She is not presenting with any manic or hypomanic symptoms at present. As noted, has endorsed a history of significant mood swings and episodes of hypomania, for which she is now on Tegretol, thus far well tolerated. She had a seizure like episode earlier this AM- went to ED, medically cleared. As noted, has a diagnosis of Pseudoseizures . With patient's express consent and at her request, I have spoken to her mother on the phone. Mother very invested in patient's care- we reviewed disposition plans. Mother states she  is also wanting to get a Neurology Second Opinion , patient has stated she plans to consider [facility] or Neurology Clinic in [place], [place]  With patient's express written consent , I spoke with Dr. [name], her outpatient Neurologist.  Hereports that pseudoseizures have been well established via specialized EEGs, and that they appear to have worsened  in frequency since her separation.  We have reviewed Carbamazepine side effects, precautions.  Cognitive Features That Contribute To Risk:  At this time patient states she feels tired and slowed cognitively following seizure like episode early in AM, but is alert, attentive, oriented, and no evidence of delirium at present  Suicide Risk:  Mild:  Suicidal ideation of limited frequency, intensity, duration, and specificity.  There are no identifiable plans, no associated intent, mild dysphoria and related symptoms, good self-control (both objective and subjective assessment), few other risk factors, and identifiable protective factors, including available and accessible social support.  Principal Problem: Major depressive disorder, recurrent episode, severe with peripartum onset Discharge Diagnoses:  Patient Active Problem List   Diagnosis Date Noted  . Major depressive disorder, recurrent episode, severe with peripartum onset [F33.2] 05/10/2015  . Post partum depression [F53] 05/09/2015  . Anastomotic ulcer [K28.9]   . Dysphagia [R13.10]   . Hematemesis [K92.0] 04/07/2015  . Intractable vomiting with nausea [R11.10] 04/07/2015  . Elevated liver enzymes [R74.8]   . Epigastric pain [R10.13]   . Abdominal pain [R10.9]   . Microcytic anemia [D50.9]   . 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 Seizure-like activity [R56.9] 02/22/2015  . Fibromyalgia [M79.7] 02/18/2015  . Vulvar fissure [N90.89] 02/18/2015  . Current smoker [Z72.0] 01/29/2015  . Hereditary and idiopathic peripheral neuropathy [G60.9] 01/15/2015  . Leg weakness, bilateral [R29.898] 01/02/2015  . Gait disorder [R26.9] 01/02/2015  . Cervicalgia [M54.2] 12/18/2014  . Sciatica of left side [M54.32] 11/14/2014  . 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 Abdominal pain, right lower quadrant [R10.31] 08/22/2014  .  Headache, migraine [G43.909] 08/19/2014  . Seizure [R56.9] 08/18/2014  . Encephalopathy [G93.40] 08/13/2014  . Headache [R51] 08/13/2014  . Altered mental status [R41.82] 08/13/2014  . Right leg numbness [R20.8] 07/31/2014  . Previous gastric bypass affecting pregnancy, antepartum [O99.840] 05/22/2014  . 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 Depression complicating pregnancy in second trimester, antepartum [W09.811, F32.9] 05/09/2014  . Rapid palpitations [R00.2] 02/18/2014  . H/O maternal third degree perineal laceration, currently pregnant [O09.299] 02/18/2014  . Maternal iron deficiency anemia complicating pregnancy [B14.782, D50.9] 01/16/2014  . Restless legs [G25.81] 01/16/2014  . Chronic interstitial cystitis [N30.10] 10/23/2013  . Menorrhagia [N92.0] 07/18/2013  . h/o Opiate addiction [F11.20] 03/11/2012    Class: Acute  . Passive suicidal ideations [R45.851] 03/10/2012    Class: Acute  . History of migraine headaches [Z86.69] 03/10/2012    Class: Acute  . Depression with anxiety [F41.8] 03/10/2012    Class: Chronic  . 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 Pelvic congestion syndrome [N94.89] 10/13/2011  . Coitalgia [N94.1] 10/13/2011  . Chronic migraine without aura [G43.709] 10/13/2011  . IBS (irritable bowel syndrome) [K58.9] 08/25/2011  . Diarrhea [R19.7] 05/27/2011  . OBESITY, UNSPECIFIED [E66.9] 09/17/2010  . LIVER FUNCTION TESTS, ABNORMAL, HX OF [Z86.39] 09/17/2010  . Iron deficiency anemia [D50.9] 07/23/2010    Follow-up Information    Follow up with [facility] On 05/19/2015.  Why:  Hospital discharge appointment on Monday June 13th at 9:15 am. Please call office if you need to reschedule.   Contact information:   [address] [place], [place]   [address] [phone]       Follow up with Maurice Small On 06/10/2015.   Why:  Tuesday, June 10, 2015 at [facility] information:   [address] [place], [place]   [address]  [phone]      Follow up with Dr. Harrington Challenger - [facility].   Why:  You are scheduled with Dr. Harrington Challenger on Wednesday, June 25, 2015 at 9:45 AM for medication management.   Contact information:   [address], [place]      Follow up with [facility] Outpatient- IOP program On 05/22/2015.   Why:  Referral made to Intensive Outpatient Program (IOP) on 6/9. If you do not hear from Dellia Nims by Tuesday June 14th, please call Dellia Nims to schedule assessment for program.    Contact information:   [address] [place] [phone]      Plan Of Care/Follow-up recommendations:  Activity:  As tolerated  Diet:  Regular Tests:  NA Other:  See belowm  Is patient on multiple antipsychotic therapies at discharge:  No   Has Patient had three or more failed trials of antipsychotic monotherapy by history:  No  Recommended Plan for Multiple Antipsychotic Therapies: NA Patient is requesting discharge, and there are no current grounds for any involuntary commitment. Plans to return  Home .  Plans to follow up as above .  Also has appointment to see Dr. [name] , Neurologist , on June 14th 9 AM, and is starting IOP at [facility] on June 16th . Patient advised not to drive unless approved by Neurologist .  Of note, patient states she plans to also go to another Neurology Clinic in order to get  Second opinion regarding seizure like episodes .     Penny [name], [place] 05/15/2015, 11:45 AM

## 2015-05-15 NOTE — ED Notes (Signed)
Sitter came out to nursing station reporting she was having a seizure. Dr. Mingo Amber, Karna Christmas, RN (charge nurse), Hassell Done, [place], and my self came to bedside. Dr. Mingo Amber stated she had her head turned to the side with stiff arms. No signs of biting lips/gum or incontinence.

## 2015-05-15 NOTE — ED Notes (Signed)
Pt states she had a seizure but does not remember the seizure. Pt states she has a aura that she is confined right before experiencing the seizure. Pt does not remember how long the seizure lasted.

## 2015-05-15 NOTE — Progress Notes (Signed)
D: Pt presents blunted in affect and depressed in mood. Pt verbally endorses anxiety. Pt is currently negative for any SI/HI/AVH. Pt complains of memory loss. Writer informed pt that some memory loss may be related to her recent seizures over the past few days. Pt is noted to be visible within the milieu but with minimal interactions with others.  A: Writer administered scheduled and prn medications to pt, per Md orders. Continued support and availability as needed was extended to this pt. Staff continue to monitor pt with q57min checks.  R: No adverse drug reactions noted. Pt receptive to treatment. Pt remains safe at this time.

## 2015-05-20 ENCOUNTER — Telehealth: Payer: Self-pay

## 2015-05-20 ENCOUNTER — Ambulatory Visit: Payer: Self-pay | Admitting: Neurology

## 2015-05-20 NOTE — Telephone Encounter (Signed)
Patient did not come to a f/u appointment.

## 2015-05-21 ENCOUNTER — Encounter: Payer: Self-pay | Admitting: Neurology

## 2015-05-22 ENCOUNTER — Encounter (HOSPITAL_COMMUNITY): Payer: Self-pay | Admitting: Emergency Medicine

## 2015-05-22 ENCOUNTER — Encounter: Payer: Self-pay | Admitting: Family Medicine

## 2015-05-22 ENCOUNTER — Ambulatory Visit (INDEPENDENT_AMBULATORY_CARE_PROVIDER_SITE_OTHER): Payer: 59 | Admitting: Family Medicine

## 2015-05-22 ENCOUNTER — Emergency Department (HOSPITAL_COMMUNITY)
Admission: EM | Admit: 2015-05-22 | Discharge: 2015-05-23 | Disposition: A | Discharge status: Home / Self Care | Payer: 59 | Attending: Emergency Medicine | Admitting: Emergency Medicine

## 2015-05-22 VITALS — BP 110/76 | Temp 97.9°F | Ht 66.0 in | Wt 227.0 lb

## 2015-05-22 DIAGNOSIS — Z8679 Personal history of other diseases of the circulatory system: Secondary | ICD-10-CM | POA: Insufficient documentation

## 2015-05-22 DIAGNOSIS — N3001 Acute cystitis with hematuria: Secondary | ICD-10-CM

## 2015-05-22 DIAGNOSIS — R109 Unspecified abdominal pain: Secondary | ICD-10-CM | POA: Diagnosis not present

## 2015-05-22 DIAGNOSIS — R61 Generalized hyperhidrosis: Secondary | ICD-10-CM | POA: Diagnosis not present

## 2015-05-22 DIAGNOSIS — Z9884 Bariatric surgery status: Secondary | ICD-10-CM | POA: Diagnosis not present

## 2015-05-22 DIAGNOSIS — Z9049 Acquired absence of other specified parts of digestive tract: Secondary | ICD-10-CM | POA: Diagnosis not present

## 2015-05-22 DIAGNOSIS — F445 Conversion disorder with seizures or convulsions: Secondary | ICD-10-CM | POA: Diagnosis not present

## 2015-05-22 DIAGNOSIS — Z720 Tobacco use: Secondary | ICD-10-CM | POA: Insufficient documentation

## 2015-05-22 DIAGNOSIS — R300 Dysuria: Secondary | ICD-10-CM

## 2015-05-22 DIAGNOSIS — Z87448 Personal history of other diseases of urinary system: Secondary | ICD-10-CM | POA: Diagnosis not present

## 2015-05-22 DIAGNOSIS — Z8639 Personal history of other endocrine, nutritional and metabolic disease: Secondary | ICD-10-CM | POA: Diagnosis not present

## 2015-05-22 DIAGNOSIS — R011 Cardiac murmur, unspecified: Secondary | ICD-10-CM | POA: Diagnosis not present

## 2015-05-22 DIAGNOSIS — M799 Soft tissue disorder, unspecified: Secondary | ICD-10-CM | POA: Insufficient documentation

## 2015-05-22 DIAGNOSIS — Z8742 Personal history of other diseases of the female genital tract: Secondary | ICD-10-CM | POA: Insufficient documentation

## 2015-05-22 DIAGNOSIS — G43909 Migraine, unspecified, not intractable, without status migrainosus: Secondary | ICD-10-CM | POA: Insufficient documentation

## 2015-05-22 DIAGNOSIS — R569 Unspecified convulsions: Secondary | ICD-10-CM | POA: Diagnosis not present

## 2015-05-22 DIAGNOSIS — Z79899 Other long term (current) drug therapy: Secondary | ICD-10-CM | POA: Diagnosis not present

## 2015-05-22 DIAGNOSIS — Z9889 Other specified postprocedural states: Secondary | ICD-10-CM | POA: Diagnosis not present

## 2015-05-22 DIAGNOSIS — F419 Anxiety disorder, unspecified: Secondary | ICD-10-CM | POA: Insufficient documentation

## 2015-05-22 DIAGNOSIS — G8929 Other chronic pain: Secondary | ICD-10-CM | POA: Diagnosis not present

## 2015-05-22 DIAGNOSIS — F329 Major depressive disorder, single episode, unspecified: Secondary | ICD-10-CM | POA: Insufficient documentation

## 2015-05-22 DIAGNOSIS — G609 Hereditary and idiopathic neuropathy, unspecified: Secondary | ICD-10-CM | POA: Diagnosis not present

## 2015-05-22 DIAGNOSIS — Z862 Personal history of diseases of the blood and blood-forming organs and certain disorders involving the immune mechanism: Secondary | ICD-10-CM | POA: Insufficient documentation

## 2015-05-22 LAB — COMPREHENSIVE METABOLIC PANEL
ALT: 29 U/L (ref 14–54)
AST: 30 U/L (ref 15–41)
Albumin: 4.4 g/dL (ref 3.5–5.0)
Alkaline Phosphatase: 94 U/L (ref 38–126)
Anion gap: 9 (ref 5–15)
BUN: 17 mg/dL (ref 6–20)
CO2: 23 mmol/L (ref 22–32)
Calcium: 9.1 mg/dL (ref 8.9–10.3)
Chloride: 106 mmol/L (ref 101–111)
Creatinine, Ser: 0.7 mg/dL (ref 0.44–1.00)
GFR calc non Af Amer: >60 mL/min (ref >60)
Glucose, Bld: 98 mg/dL (ref 65–99)
POTASSIUM: 4.2 mmol/L (ref 3.5–5.1)
SODIUM: 138 mmol/L (ref 135–145)
TOTAL PROTEIN: 7.6 g/dL (ref 6.5–8.1)
Total Bilirubin: 0.4 mg/dL (ref 0.3–1.2)

## 2015-05-22 LAB — CBC WITH DIFFERENTIAL/PLATELET
BASOS ABS: 0 10*3/uL (ref 0.0–0.1)
Basophils Relative: 1 % (ref 0–1)
Eosinophils Absolute: 0.2 10*3/uL (ref 0.0–0.7)
Eosinophils Relative: 2 % (ref 0–5)
HCT: 36.7 % (ref 36.0–46.0)
HEMOGLOBIN: 11.9 g/dL — AB (ref 12.0–15.0)
Lymphocytes Relative: 40 % (ref 12–46)
Lymphs Abs: 3.2 10*3/uL (ref 0.7–4.0)
MCH: 26.2 pg (ref 26.0–34.0)
MCHC: 32.4 g/dL (ref 30.0–36.0)
MCV: 80.7 fL (ref 78.0–100.0)
MONO ABS: 0.8 10*3/uL (ref 0.1–1.0)
Monocytes Relative: 11 % (ref 3–12)
NEUTROS ABS: 3.6 10*3/uL (ref 1.7–7.7)
Neutrophils Relative %: 46 % (ref 43–77)
PLATELETS: 259 10*3/uL (ref 150–400)
RBC: 4.55 MIL/uL (ref 3.87–5.11)
RDW: 21 % — AB (ref 11.5–15.5)
WBC: 7.9 10*3/uL (ref 4.0–10.5)

## 2015-05-22 LAB — CARBAMAZEPINE LEVEL, TOTAL: CARBAMAZEPINE LVL: 4 ug/mL (ref 4.0–12.0)

## 2015-05-22 MED ORDER — PHENAZOPYRIDINE HCL 200 MG PO TABS: ORAL_TABLET | ORAL | Status: DC

## 2015-05-22 MED ORDER — KETOROLAC TROMETHAMINE 30 MG/ML IJ SOLN
30.0000 mg | Freq: Once | INTRAMUSCULAR | Status: DC
  Filled 2015-05-22: qty 1

## 2015-05-22 MED ORDER — CIPROFLOXACIN 500 MG/5ML (10%) PO SUSR: ORAL | Status: DC

## 2015-05-22 MED ORDER — SODIUM CHLORIDE 0.9 % IV BOLUS (SEPSIS)
1000.0000 mL | Freq: Once | INTRAVENOUS | Status: AC
  Administered 2015-05-22: 1000 mL via INTRAVENOUS

## 2015-05-22 MED ORDER — ACETAMINOPHEN 325 MG PO TABS
650.0000 mg | ORAL_TABLET | Freq: Once | ORAL | Status: DC
  Filled 2015-05-22: qty 2

## 2015-05-22 NOTE — Progress Notes (Signed)
   Subjective:    Patient ID: Penny [name], female    DOB: February 24, 1986, 29 y.o.   MRN: [id number]  HPI Was seen in emergency room for pseudoseizures recently. Followed up with behavioral health. Now schedule with a neurology/psychiatrist at [facility].  Patient gives history of interstitial cystitis, hasn't bothered for while.  Notes low abdominal pain primarily. Also some low back discomfort. Next  Slight nausea no active vomiting.   Burning with urination, abd pain, and back pain.  Started 2 days ago. Taking azo.   Back pain bladdrer pain  Pos nocturia  Pos dysuria and incr freq   No help from azo On menses cycle  No abx for bladde Kidney infxn       Review of Systems No high fever no chills no headache no chest pain    Objective:   Physical Exam Alert active no acute distress. Lungs clear heart rare rhythm H&T normal. Right CVA region slight tenderness palpation abdomen good bowel sounds diffuse mild tenderness positive suprapubic tenderness.   Urinalysis numerous red blood cells. Also 10-20 white blood cells.       Assessment & Plan:  Impression 1 urinary tract infection. #2 hematuria likely secondary to menstrual cycle discussed #3 interstitial cystitis may plan to be symptoms #4 pseudoseizures. Of note at the finish of the abdominal exam the patient called to her mother then proceeded to go through a very protracted seizure-like episode. This resolved at one point and patient was talkative again enough to ask questions regarding her medication. 25 minutes spent most in discussion

## 2015-05-22 NOTE — ED Provider Notes (Signed)
CSN: [id number]     Arrival date & time 05/22/15  2153 History  This chart was scribed for Ripley Fraise, MD by Eustaquio Maize, ED Scribe. This patient was seen in room APA02/APA02 and the patient's care was started at 11:06 PM.   Chief Complaint  Patient presents with  . Flank Pain  . Seizures   Patient is a 29 y.o. female presenting with flank pain and seizures. The history is provided by the patient. No language interpreter was used.  Flank Pain This is a new problem. Associated symptoms include abdominal pain and headaches. Pertinent negatives include no chest pain.  Seizures Seizure activity on arrival: no   Seizure type:  Unable to specify Preceding symptoms: headache   Preceding symptoms: no nausea and no numbness   Return to baseline: yes   Recent head injury:  No recent head injuries History of seizures: yes      HPI Comments: Penny [name] is a 29 y.o. female who presents to the Emergency Department complaining of right side flank pain that began today. Pt had follow up appointment with PCP, Dr. Wolfgang Phoenix, today. She had UA done and was found to have UTI. Pt is currently taking Cipro for the infection. She mentions that she has had multiple seizures today as well. Pt had 2 while at PCP's office. She reports also having one that lasted 45 minutes that was witnessed by her mom and another one while in the waiting room here tonight. Denies head injury. Pt is currently on Topamax and Tegretol for her seizures. She was diagnosed with seizures 9 months ago while giving birth to her daughter. She cannot say if she bites her tongue or has urinary incontinence while having seizures. Pt also complains of periumbilical and right sided abdominal pain, headache, and night sweats for the past week. She denies fever, nausea, vomiting, chest pain, weakness or numbness in extremities, or any other symptoms.   Past Medical History  Diagnosis Date  . Migraines   . Interstitial cystitis   . IUD FEB  2010  . Gastritis JULY 2011  . Depression   . 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 Fatty liver   . Obesity (BMI 30-39.9) 2011 228 LBS BMI 36.8  . Iron deficiency anemia 07/23/2010  . Irritable bowel syndrome 2012 DIARRHEA    JUN 2012 TTG IgA 14.9  . Elevated liver enzymes JUL 2011ALK PHOS 111-127 AST  143-267 ALT  213-321T BILI 0.6  ALB  3.7-4.06 Jun 2011 ALK PHOS 118 AST 24 ALT 42 T BILI 0.4 ALB 3.9  . Chronic daily headache   . Ovarian cyst   . ADD (attention deficit disorder)   . RLQ abdominal pain 07/18/2013  . Menorrhagia 07/18/2013  . Anxiety   . Potassium (K) deficiency   . Dysmenorrhea 09/18/2013  . Stress 09/18/2013  . Patient desires pregnancy 09/18/2013  . Pregnant 12/25/2013  . Blood transfusion without reported diagnosis   . Heart murmur   . Gastric bypass status for obesity   . 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 Dysrhythmia   . Sciatica of left side 11/14/2014  . Fibromyalgia   . 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 PONV (postoperative nausea and vomiting) seizure post-operatively   Past Surgical History  Procedure Laterality Date  . Gab  2007    in [place]-POUCH 5 CM  . Cholecystectomy  2005    biliary dyskinesia  . Cath  self every nite      for sodium bicarb injection (discontinued 2013)  . Colonoscopy  JUN 2012 ABD PN/DIARRHEA WITH PROPOFOL    NL COLON  . Upper gastrointestinal endoscopy  JULY 2011 NAUSEA-D125,V6, PH 25    Bx; GASTRITIS, POUCH-5 CM LONG  . Dilation and curettage of uterus    . Tonsillectomy    . Gastric bypass  06/2006  . Savory dilation  06/20/2012    Dr. Barnie Alderman gastritis/Ulcer in the mid jejunum. Empiric dilation.   . Tonsillectomy and adenoidectomy    . Esophagogastroduodenoscopy    . Wisdom tooth extraction    . Repair vaginal cuff N/A 07/30/2014    Procedure: REPAIR VAGINAL CUFF;  Surgeon: Mora Bellman, MD;  Location: [facility];  Service: Gynecology;  Laterality: N/A;  . Hysteroscopy w/d&c  N/A 09/12/2014    Procedure: DILATATION AND CURETTAGE /HYSTEROSCOPY;  Surgeon: Jonnie Kind, MD;  Location: [facility];  Service: Gynecology;  Laterality: N/A;  . Esophagogastroduodenoscopy (egd) with propofol N/A 04/10/2015    Procedure: ESOPHAGOGASTRODUODENOSCOPY (EGD) WITH PROPOFOL;  Surgeon: Daneil Dolin, MD;  Location: [facility];  Service: Endoscopy;  Laterality: N/A;  . Esophageal dilation N/A 04/10/2015    Procedure: ESOPHAGEAL DILATION WITH 54FR MALONEY DILATOR;  Surgeon: Daneil Dolin, MD;  Location: [facility];  Service: Endoscopy;  Laterality: N/A;  . Esophageal biopsy  04/10/2015    Procedure: BIOPSY;  Surgeon: Daneil Dolin, MD;  Location: [facility];  Service: Endoscopy;;   Family History  Problem Relation Age of Onset  . Hemochromatosis Maternal Grandmother   . Migraines Maternal Grandmother   . Cancer Maternal Grandmother   . Breast cancer Maternal Grandmother   . Hypertension Father   . Diabetes Father   . Coronary artery disease Father   . Migraines Paternal Grandmother   . Breast cancer Paternal Grandmother   . Cancer Mother     breast  . Hemochromatosis Mother   . Breast cancer Mother   . Coronary artery disease Paternal Grandfather    History  Substance Use Topics  . Smoking status: Current Every Day Smoker -- 0.50 packs/day for 6 years    Types: Cigarettes  . Smokeless tobacco: Never Used     Comment: smokes 1 cigarette a day  . Alcohol Use: No   OB History    Gravida Para Term Preterm AB TAB SAB Ectopic Multiple Living   '3 2 1 1 1  1   2     '$ Review of Systems  Constitutional: Positive for diaphoresis. Negative for fever and chills.  Cardiovascular: Negative for chest pain.  Gastrointestinal: Positive for abdominal pain. Negative for nausea and vomiting.  Genitourinary: Positive for flank pain.  Neurological: Positive for seizures and headaches. Negative for weakness and numbness.  All other systems reviewed and are negative.     Allergies  Gabapentin;  Metoclopramide hcl; Codeine; Zofran; Latex; and Tape  Home Medications   Prior to Admission medications   Medication Sig Start Date End Date Taking? Authorizing Provider  carbamazepine (TEGRETOL) 200 MG tablet Take 1 tablet (200 mg total) by mouth 2 (two) times daily. 05/15/15   Kerrie Buffalo, NP  ciprofloxacin (CIPRO) 500 MG/5ML (10%) suspension Take one tsp bid for 10 days 05/22/15   Mikey Kirschner, MD  cyclobenzaprine (FLEXERIL) 10 MG tablet Take 1 tablet (10 mg total) by mouth 2 (two) times daily. 05/15/15   Kerrie Buffalo, NP  DULoxetine (CYMBALTA) 60 MG capsule Take 1 capsule (60 mg total) by  mouth daily. 05/15/15   Kerrie Buffalo, NP  ferrous sulfate 325 (65 FE) MG tablet Take 1 tablet (325 mg total) by mouth 2 (two) times daily with a meal. 05/15/15   Kerrie Buffalo, NP  hydrOXYzine (ATARAX/VISTARIL) 25 MG tablet Take 1 tablet (25 mg total) by mouth every 6 (six) hours as needed for anxiety (sleep). 05/15/15   Kerrie Buffalo, NP  hyoscyamine (LEVSIN SL) 0.125 MG SL tablet Place 1 tablet (0.125 mg total) under the tongue every 4 (four) hours as needed. 05/15/15   Kerrie Buffalo, NP  pantoprazole (PROTONIX) 40 MG tablet Take 1 tablet (40 mg total) by mouth 2 (two) times daily before a meal. 05/15/15   Kerrie Buffalo, NP  phenazopyridine (PYRIDIUM) 200 MG tablet Take one tablet tid for 5 days prn 05/22/15   Mikey Kirschner, MD  topiramate (TOPAMAX) 100 MG tablet Take 1 tablet (100 mg total) by mouth at bedtime. 05/15/15   Kerrie Buffalo, NP  traZODone (DESYREL) 150 MG tablet Take 1 tablet (150 mg total) by mouth at bedtime as needed for sleep. 05/15/15   Kerrie Buffalo, NP   Triage Vitals: BP 120/70 mmHg  Pulse 114  Temp(Src) 99.9 F (37.7 C) (Oral)  Resp 20  SpO2 98%  LMP 05/02/2015   Physical Exam  Nursing note and vitals reviewed. CONSTITUTIONAL: Well developed/well nourished HEAD: Normocephalic/atraumatic EYES: EOMI/PERRL ENMT: Mucous membranes moist NECK: supple no meningeal  signs SPINE/BACK:entire spine nontender CV: S1/S2 noted, no murmurs/rubs/gallops noted LUNGS: Lungs are clear to auscultation bilaterally, no apparent distress ABDOMEN: soft, nontender, no rebound or guarding, bowel sounds noted throughout abdomen ZY:YQMGN cva tenderness NEURO: Pt is awake/alert/appropriate, moves all extremitiesx4.  No facial droop.  No arm or leg drift noted.  EXTREMITIES: pulses normal/equal, full ROM SKIN: warm, color normal PSYCH: no abnormalities of mood noted, alert and oriented to situation   ED Course  Procedures   DIAGNOSTIC STUDIES: Oxygen Saturation is 98% on RA, normal by my interpretation.    COORDINATION OF CARE: 11:16 PM-Discussed treatment plan which includes CBC, Carbamazepine, Urine pregnancy, UA, CMP with pt at bedside and pt agreed to plan.    Pt well appearing No further SZ activity after my evaluation Her abdomen was soft without focal tenderness to suggest acute abdominal emergency Pt does have h/o chronic abdominal pain as well She had CVAT and with recent UTI noted by PCP (in notes) will treat as pyelo She had recent negative pregnancy test, further urine studies deferred Loaded with rocephin and she is to continue her cipro at home She requests diflucan at discharge as well For her seizures, this has had previous workup and is managed by neuro - she is awake/alert at this time no further intervention.  She understands not to drive or bathe/swim alone She is not septic appearing Vitals appropriate We discussed strict return precautions BP 114/70 mmHg  Pulse 72  Temp(Src) 97.7 F (36.5 C) (Oral)  Resp 16  SpO2 96%  LMP 05/02/2015  Labs Review Labs Reviewed  CBC WITH DIFFERENTIAL/PLATELET - Abnormal; Notable for the following:    Hemoglobin 11.9 (*)    RDW 21.0 (*)    All other components within normal limits  COMPREHENSIVE METABOLIC PANEL  CARBAMAZEPINE LEVEL, TOTAL   Medications  sodium chloride 0.9 % bolus 1,000 mL (0 mLs  Intravenous Stopped 05/23/15 0040)  cefTRIAXone (ROCEPHIN) 1 g in dextrose 5 % 50 mL IVPB (0 g Intravenous Stopped 05/23/15 0115)  oxyCODONE-acetaminophen (PERCOCET/ROXICET) 5-325 MG per tablet 2 tablet (2  tablets Oral Given 05/23/15 0039)     MDM   Final diagnoses:  Right flank pain  Seizure    Nursing notes including past medical history and social history reviewed and considered in documentation Labs/vital reviewed myself and considered during evaluation   I personally performed the services described in this documentation, which was scribed in my presence. The recorded information has been reviewed and is accurate.      Ripley Fraise, MD 05/23/15 [phone]

## 2015-05-22 NOTE — ED Notes (Addendum)
Patient states that she spoke with her mother who told her that she was not supposed to take toradol per the neurologist. MD aware.

## 2015-05-22 NOTE — ED Notes (Signed)
Per family member pt. C/o flank pain today. Pt. With seizure-like activity at triage.

## 2015-05-23 DIAGNOSIS — R109 Unspecified abdominal pain: Secondary | ICD-10-CM | POA: Diagnosis not present

## 2015-05-23 MED ORDER — OXYCODONE-ACETAMINOPHEN 5-325 MG PO TABS
2.0000 | ORAL_TABLET | Freq: Once | ORAL | Status: AC
  Administered 2015-05-23: 2 via ORAL
  Filled 2015-05-23: qty 2

## 2015-05-23 MED ORDER — FLUCONAZOLE 150 MG PO TABS: 150.0000 mg | ORAL_TABLET | Freq: Once | ORAL | Status: DC

## 2015-05-23 MED ORDER — CEFTRIAXONE SODIUM 1 G IJ SOLR
1.0000 g | Freq: Once | INTRAMUSCULAR | Status: AC
  Administered 2015-05-23: 1 g via INTRAVENOUS
  Filled 2015-05-23: qty 10

## 2015-05-24 LAB — URINE CULTURE

## 2015-05-27 ENCOUNTER — Encounter: Payer: Self-pay | Admitting: Neurology

## 2015-05-28 IMAGING — CT CT ABD-PELV W/ CM
2 of 3 series · 16 of 46 positions shown, 18 images · IV contrast (Omnipaque 300)
Comparison: March 19, 2015

CLINICAL DATA: One day history of nausea and vomiting with 1 week
history of epigastric pain and diarrhea.

EXAM:
CT ABDOMEN AND PELVIS WITH CONTRAST
TECHNIQUE: Multidetector CT imaging of the abdomen and pelvis was performed
using the standard protocol following bolus administration of
intravenous contrast. Oral contrast was also administered.
CONTRAST:  25mL OMNIPAQUE IOHEXOL 300 MG/ML SOLN, 100mL OMNIPAQUE
IOHEXOL 300 MG/ML SOLN

[Series 2: abd_pel_with 5.0 b40f · axial · 0.82mm/px · z∈[-484,-14]mm · 13 of 108 slices shown, 15 images]
[im 7/108  soft-tissue]
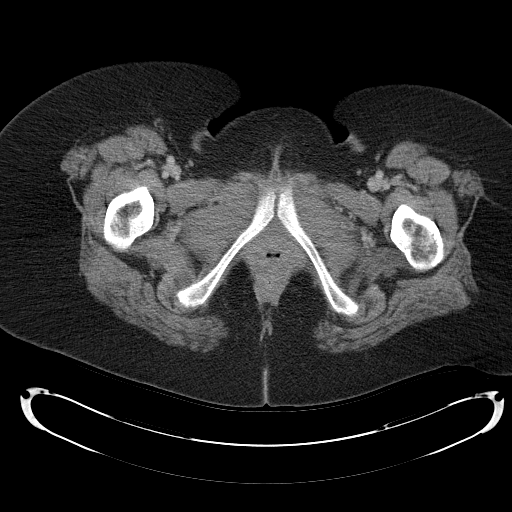
[im 7/108  bone]
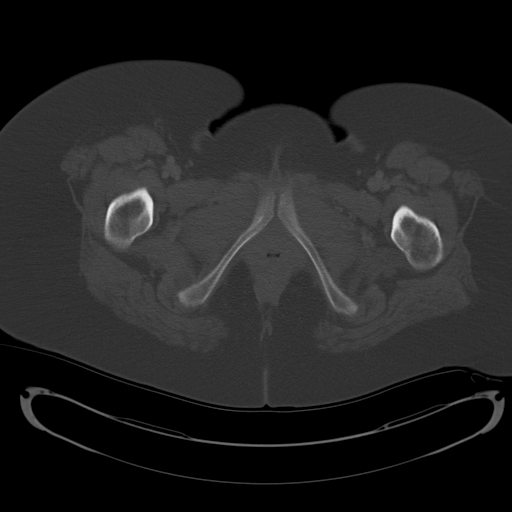
[im 14/108  soft-tissue]
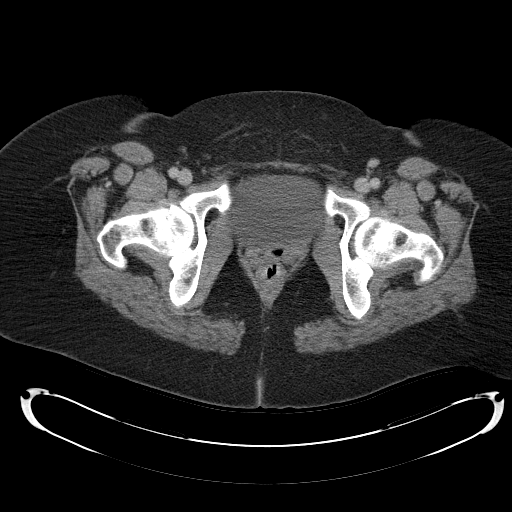
[im 21/108  soft-tissue]
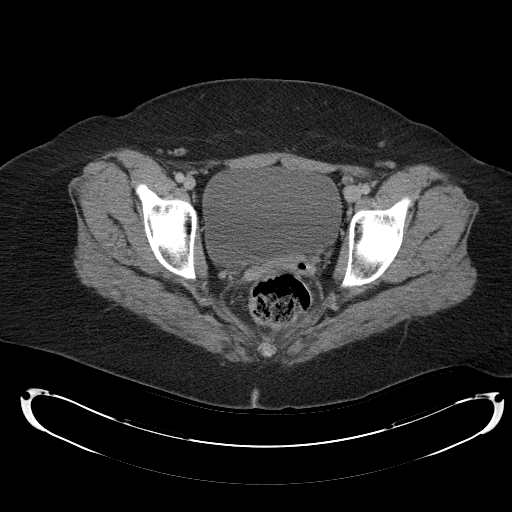
[im 32/108  soft-tissue]
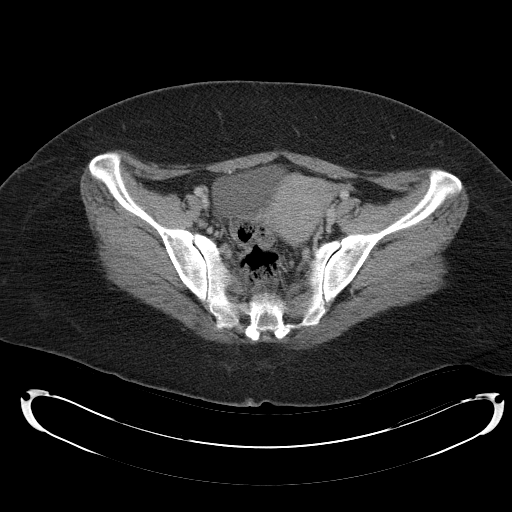
[im 38/108  soft-tissue]
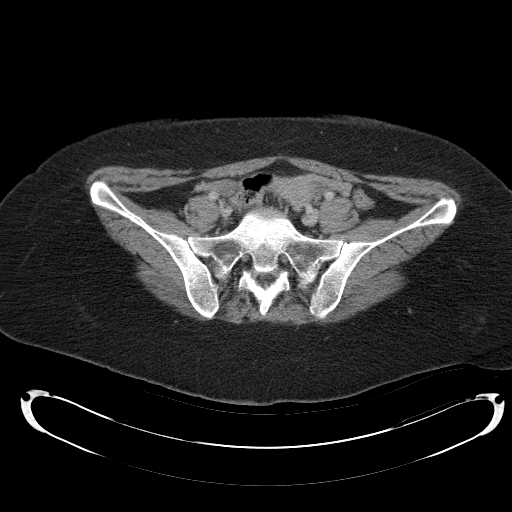
[im 45/108  soft-tissue]
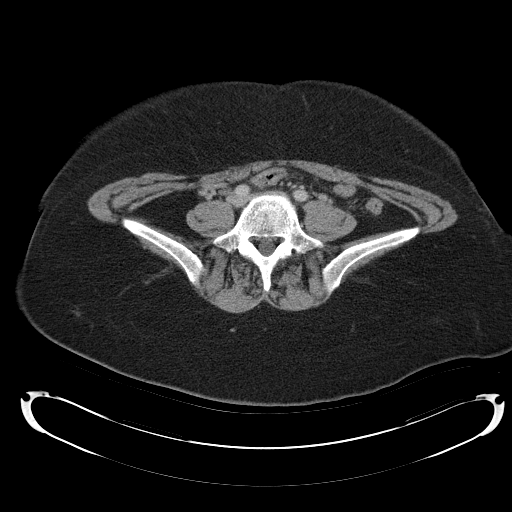
[im 56/108  soft-tissue]
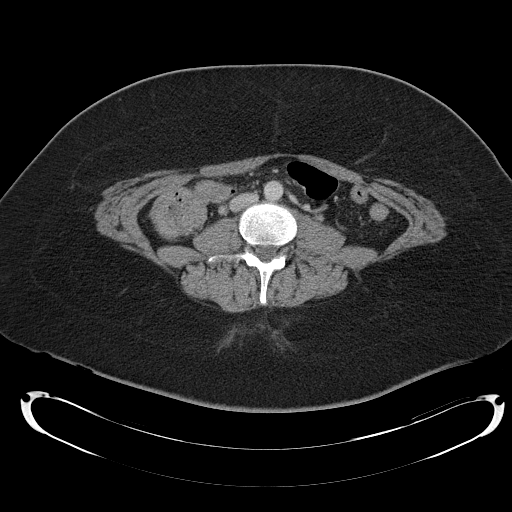
[im 63/108  soft-tissue]
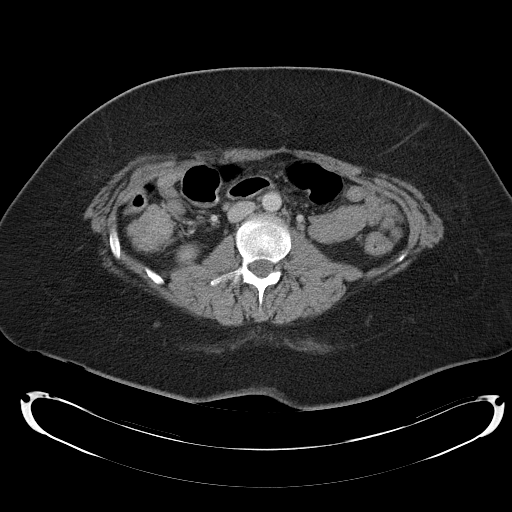
[im 70/108  soft-tissue]
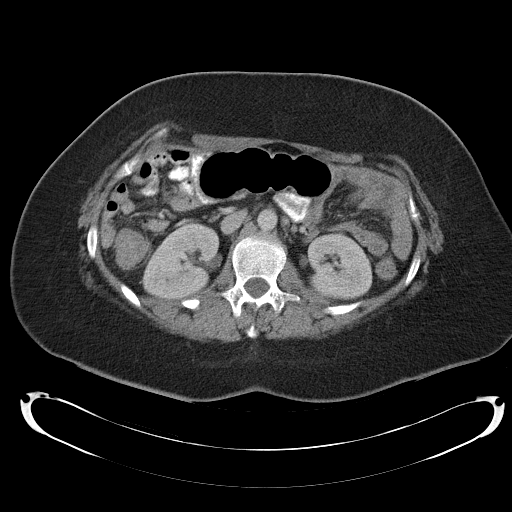
[im 70/108  bone]
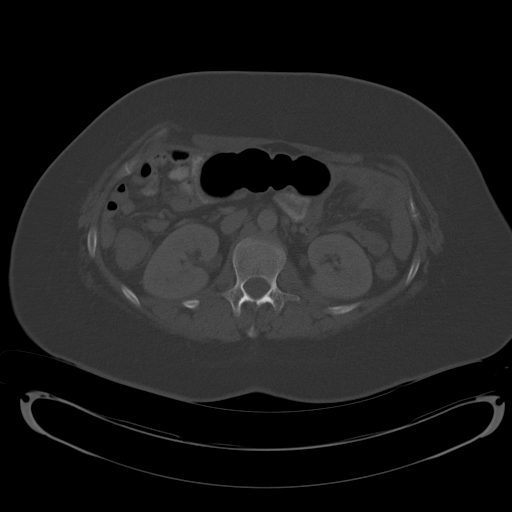
[im 76/108  soft-tissue]
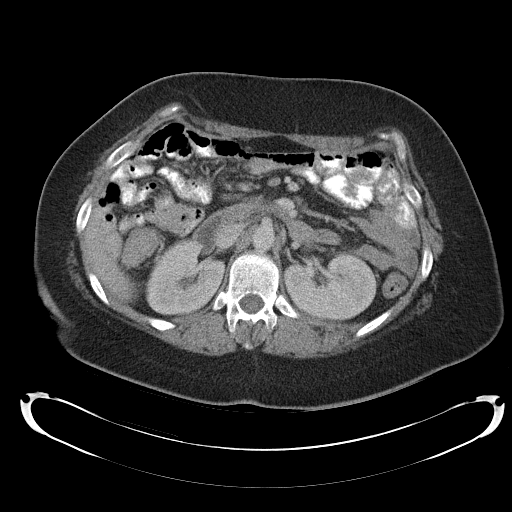
[im 87/108  soft-tissue]
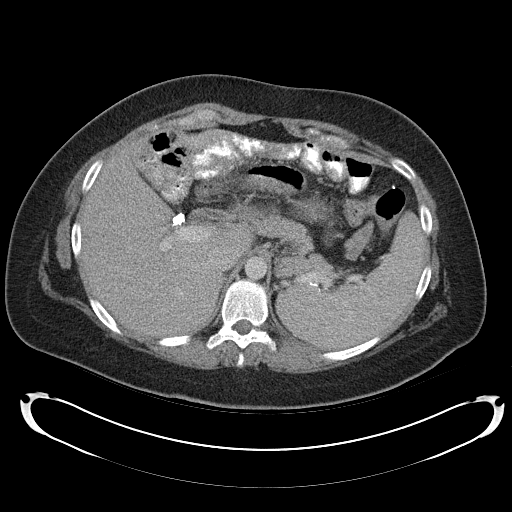
[im 94/108  soft-tissue]
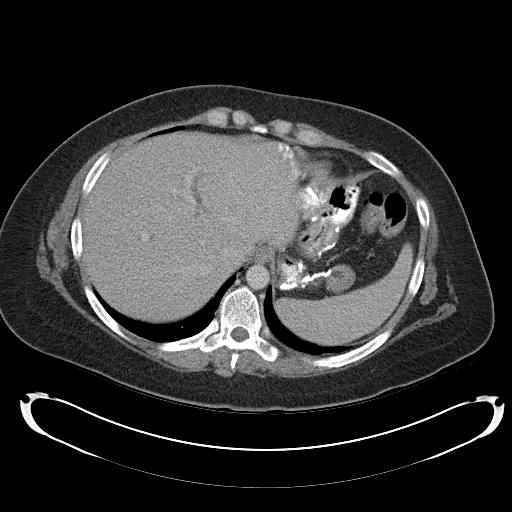
[im 101/108  soft-tissue]
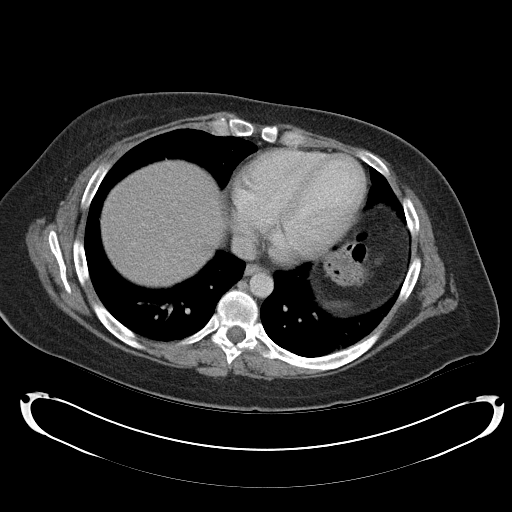

[Series 3: abd_pel_with 3.0 spo cor · coronal · 0.93mm/px · 3 of 86 slices shown]
[im 29/86  soft-tissue]
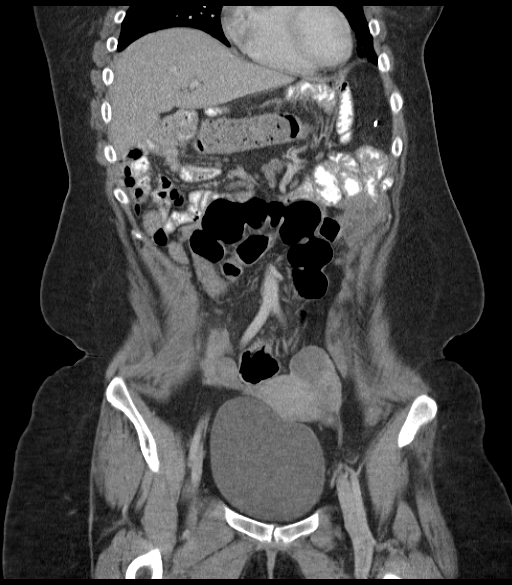
[im 38/86  soft-tissue]
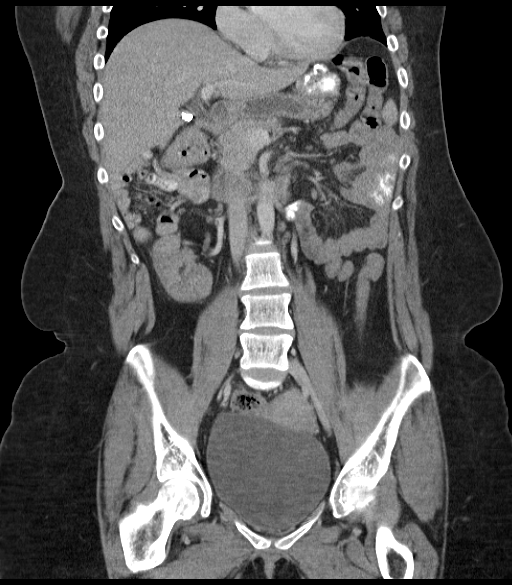
[im 48/86  soft-tissue]
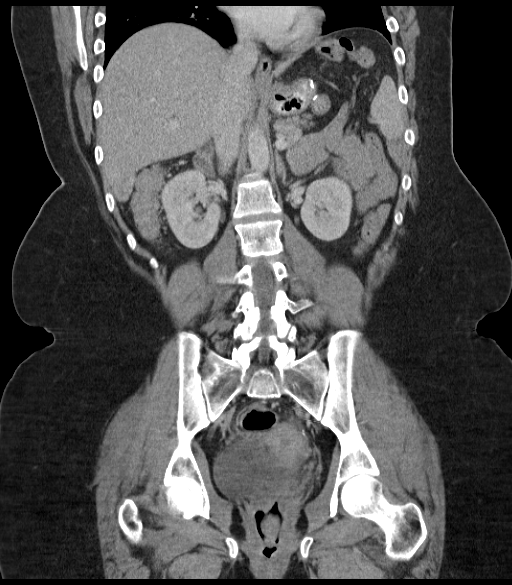

[16 of 46 positions shown; findings below may reference images not displayed]

FINDINGS: There is atelectatic change in the left lung base. Lung bases
otherwise clear.

Patient is status post gastric bypass procedure without complicating
features. There is no wall thickening or perigastric fluid in the
left upper quadrant region.

No focal liver lesions are identified. The gallbladder is absent.
The distal common bile duct measures 11 mm, stable. There is no mass
or calculus appreciable in the biliary ductal system.

Spleen, pancreas, and adrenals appear normal.

Kidneys bilaterally show no mass or hydronephrosis on either side.
There is no renal or ureteral calculus on either side.

In the pelvis, the urinary bladder is midline with normal wall
thickness. There is no pelvic mass or pelvic fluid collection. The
appendix appears normal. Note that the recent right ovarian cyst is
no longer appreciable.

There is no bowel obstruction. No free air or portal venous air.
There is no ascites, adenopathy, or abscess in the abdomen or
pelvis. There is no demonstrable abdominal aortic aneurysm. There
are no blastic or lytic bone lesions.
IMPRESSION: Patient is status post gastric bypass procedure without complicating
feature appreciable. Currently there is no bowel obstruction. No
abscess. Appendix region appears normal.

Mild common bile duct prominence is stable and is probably secondary
to post cholecystectomy state. No biliary duct mass or calculus
seen.

Previously noted right ovarian cyst has resolved.

No renal or ureteral calculus.  No hydronephrosis.

## 2015-05-29 IMAGING — US US ABDOMEN LIMITED
1 series · 14 of 25 positions shown · non-contrast
Comparison: Abdominal ultrasound September 13, 2014

CLINICAL DATA: One month of abdominal pain with increased
transaminases, history of cholecystectomy, irritable bowel disease,
gastric bypass.

EXAM:
US ABDOMEN LIMITED - RIGHT UPPER QUADRANT

[Series 1: us abdomen limited · 0.25mm/px · 14 of 36 slices shown]
[im 1/36]
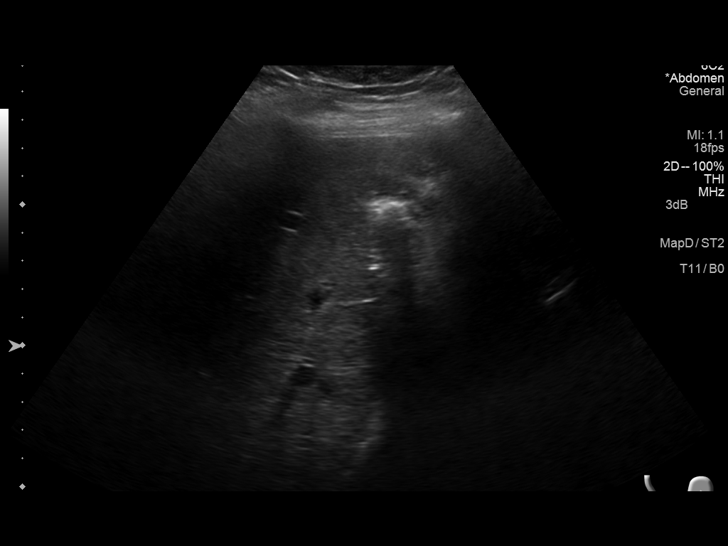
[im 3/36]
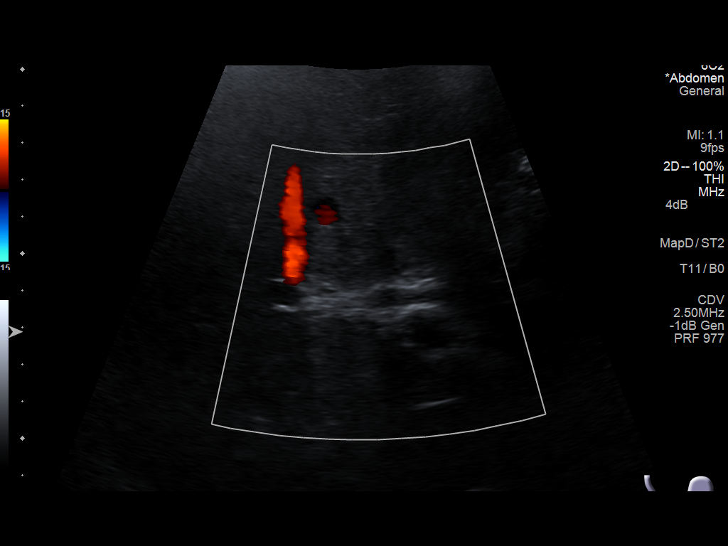
[im 6/36]
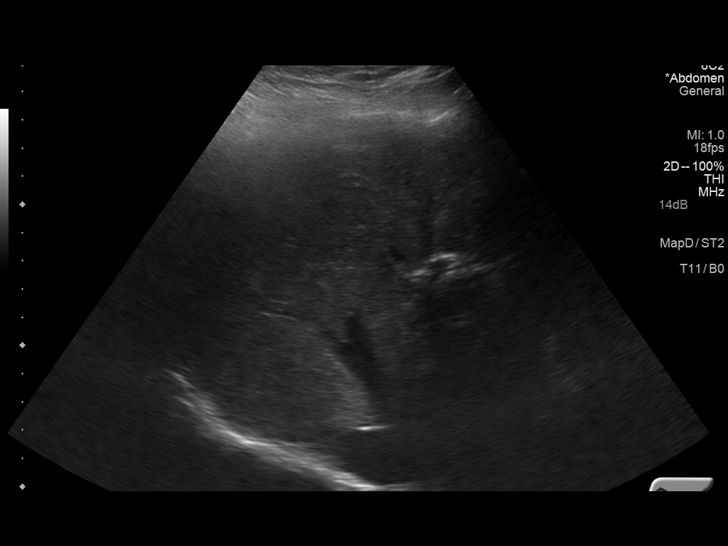
[im 9/36]
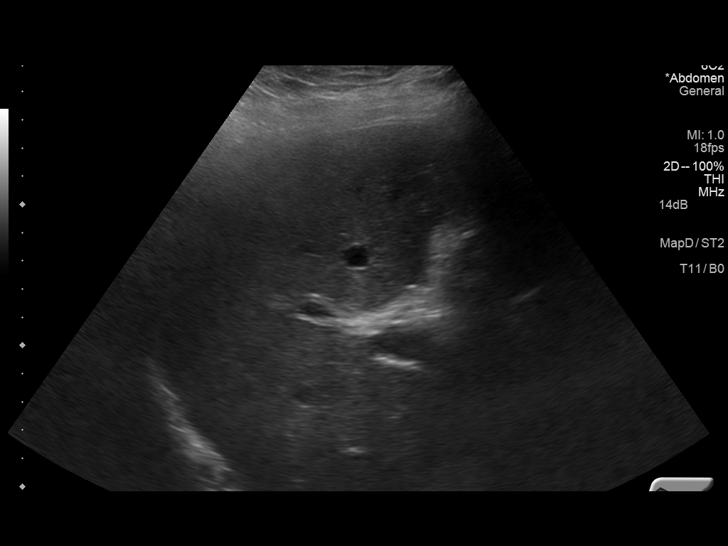
[im 12/36]
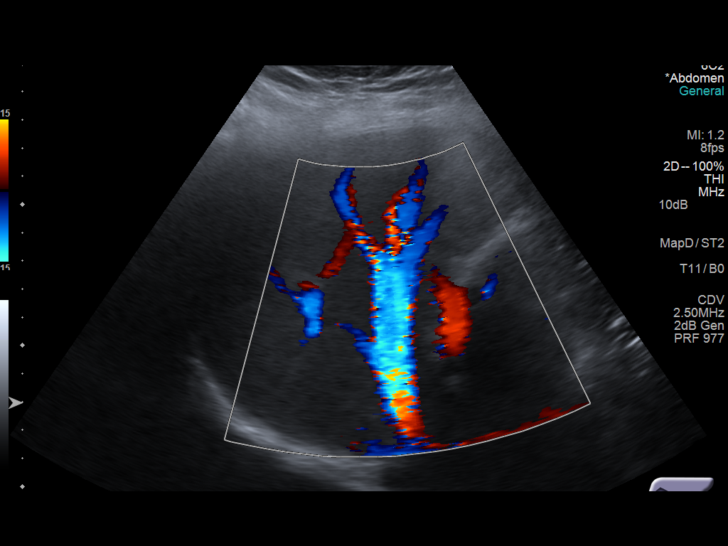
[im 14/36]
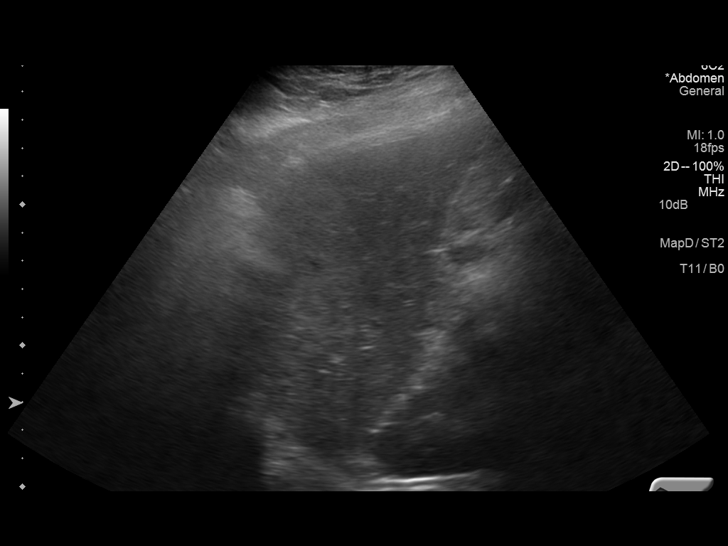
[im 17/36]
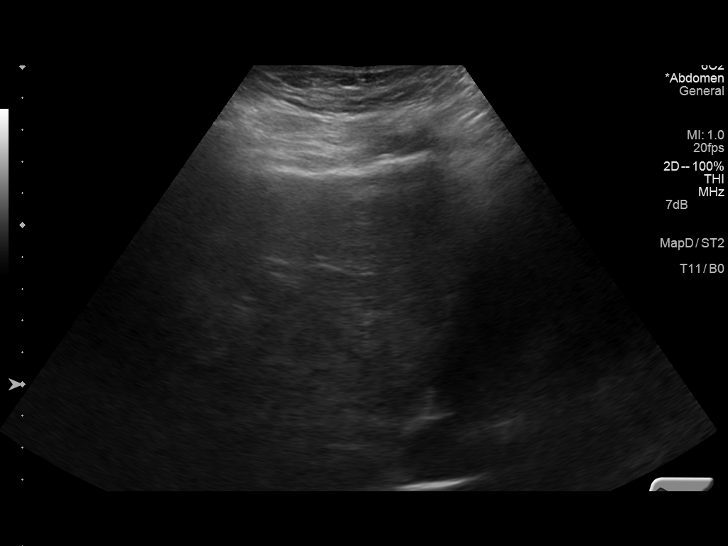
[im 19/36]
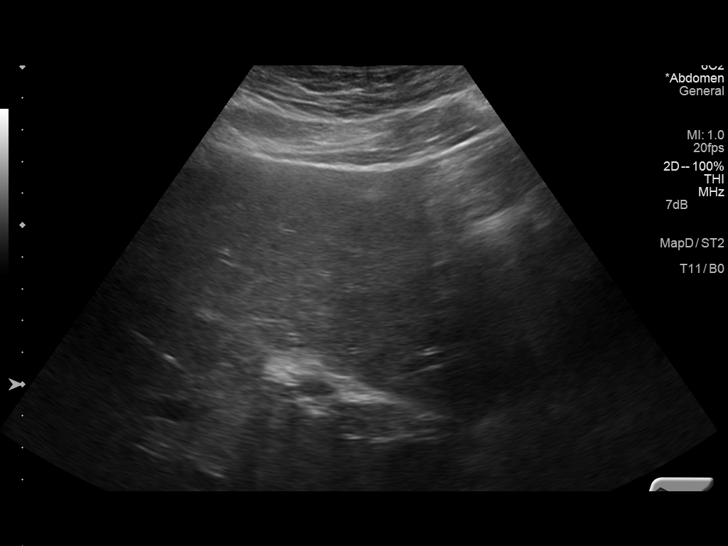
[im 22/36]
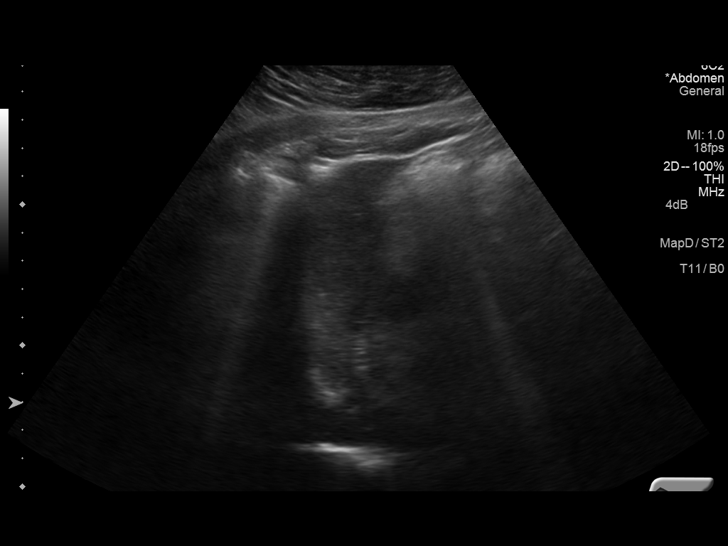
[im 24/36]
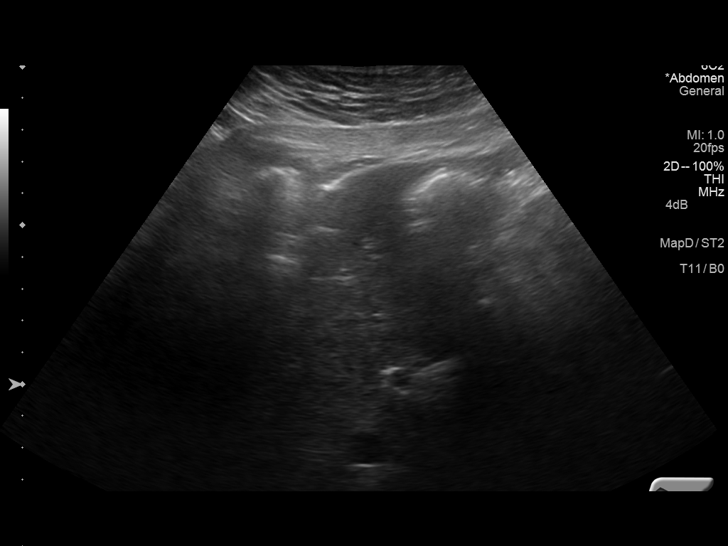
[im 27/36]
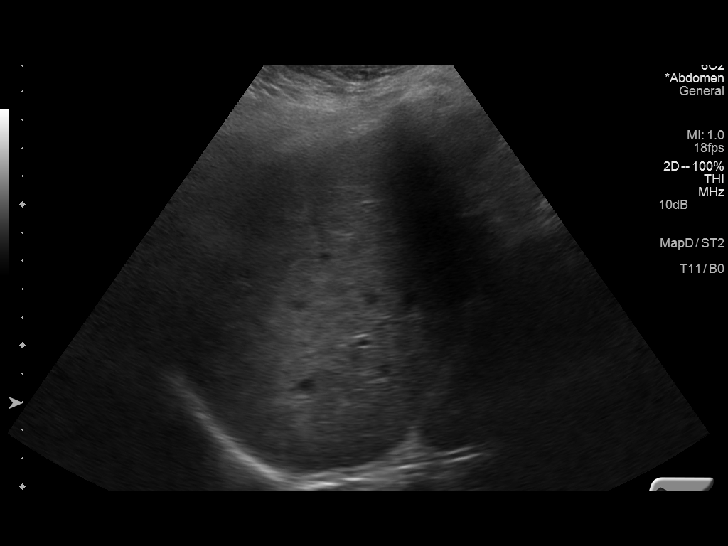
[im 30/36]
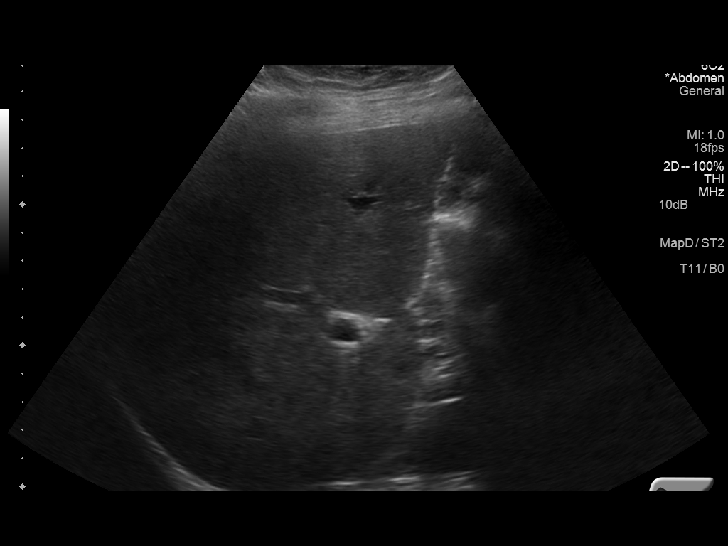
[im 33/36]
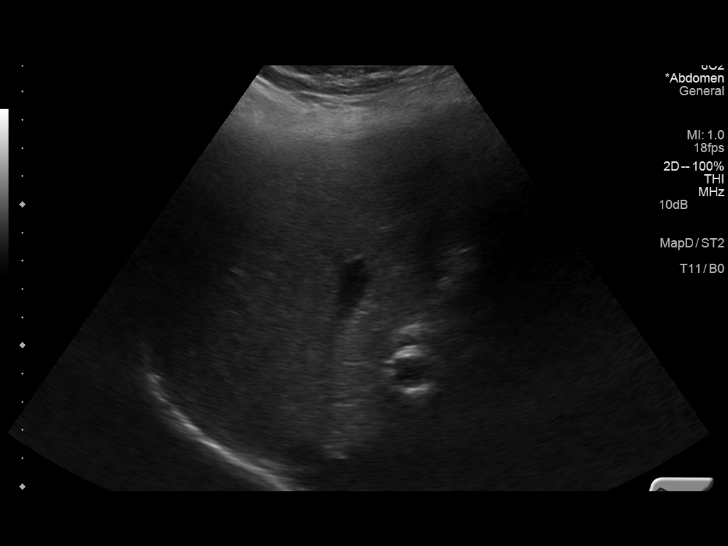
[im 36/36]
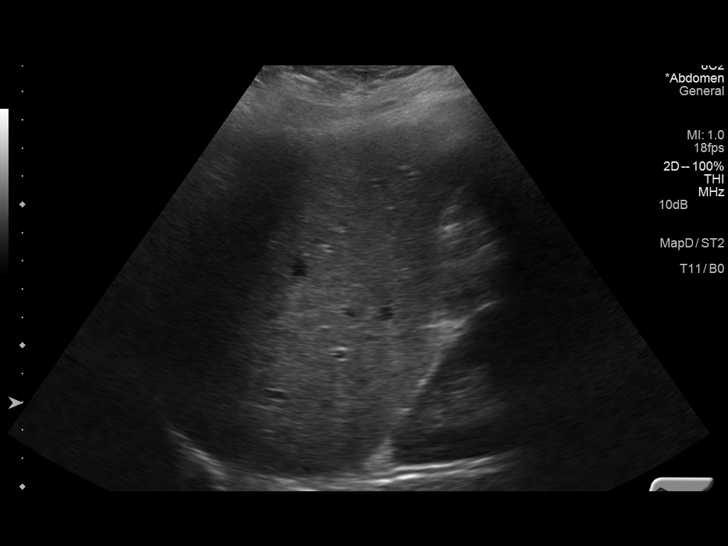

[14 of 25 positions shown; findings below may reference images not displayed]

FINDINGS: The study is limited by the patient's body habitus, bowel gas, and
inability to hold her breath.

Gallbladder:

The gallbladder is surgically absent.

Common bile duct:

Diameter: 5.6 mm

Liver:

The hepatic echotexture is mildly increased. There is no focal mass
or intrahepatic ductal dilation. Where visualized the surface
contour is normal.
IMPRESSION: 1. Fatty infiltrative change of the liver. Within the limits of the
study no hepatic parenchymal abnormality is demonstrated otherwise.
2. The gallbladder is surgically absent.

## 2015-06-10 ENCOUNTER — Ambulatory Visit (HOSPITAL_COMMUNITY): Payer: 59 | Admitting: Psychiatry

## 2015-06-11 ENCOUNTER — Other Ambulatory Visit: Payer: Self-pay

## 2015-06-11 ENCOUNTER — Emergency Department (HOSPITAL_COMMUNITY): Payer: 59

## 2015-06-11 ENCOUNTER — Emergency Department (HOSPITAL_COMMUNITY)
Admission: EM | Admit: 2015-06-11 | Discharge: 2015-06-11 | Disposition: A | Discharge status: Home / Self Care | Payer: 59 | Attending: Emergency Medicine | Admitting: Emergency Medicine

## 2015-06-11 ENCOUNTER — Ambulatory Visit (INDEPENDENT_AMBULATORY_CARE_PROVIDER_SITE_OTHER): Payer: 59 | Admitting: Internal Medicine

## 2015-06-11 ENCOUNTER — Ambulatory Visit (HOSPITAL_COMMUNITY)
Admission: RE | Admit: 2015-06-11 | Discharge: 2015-06-11 | Disposition: A | Discharge status: Home / Self Care | Payer: 59 | Source: Ambulatory Visit | Attending: Internal Medicine | Admitting: Internal Medicine

## 2015-06-11 ENCOUNTER — Encounter (HOSPITAL_COMMUNITY): Payer: Self-pay | Admitting: Emergency Medicine

## 2015-06-11 ENCOUNTER — Encounter (INDEPENDENT_AMBULATORY_CARE_PROVIDER_SITE_OTHER): Payer: Self-pay | Admitting: Internal Medicine

## 2015-06-11 VITALS — BP 102/68 | HR 72 | Temp 98.4°F | Ht 66.0 in | Wt 224.9 lb

## 2015-06-11 DIAGNOSIS — R740 Nonspecific elevation of levels of transaminase and lactic acid dehydrogenase [LDH]: Secondary | ICD-10-CM | POA: Diagnosis not present

## 2015-06-11 DIAGNOSIS — R112 Nausea with vomiting, unspecified: Secondary | ICD-10-CM | POA: Insufficient documentation

## 2015-06-11 DIAGNOSIS — R011 Cardiac murmur, unspecified: Secondary | ICD-10-CM | POA: Diagnosis not present

## 2015-06-11 DIAGNOSIS — Z3202 Encounter for pregnancy test, result negative: Secondary | ICD-10-CM | POA: Diagnosis not present

## 2015-06-11 DIAGNOSIS — Z91040 Latex allergy status: Secondary | ICD-10-CM | POA: Diagnosis not present

## 2015-06-11 DIAGNOSIS — Z8742 Personal history of other diseases of the female genital tract: Secondary | ICD-10-CM | POA: Insufficient documentation

## 2015-06-11 DIAGNOSIS — G40909 Epilepsy, unspecified, not intractable, without status epilepticus: Secondary | ICD-10-CM | POA: Insufficient documentation

## 2015-06-11 DIAGNOSIS — Z8719 Personal history of other diseases of the digestive system: Secondary | ICD-10-CM | POA: Insufficient documentation

## 2015-06-11 DIAGNOSIS — Z79899 Other long term (current) drug therapy: Secondary | ICD-10-CM | POA: Insufficient documentation

## 2015-06-11 DIAGNOSIS — F329 Major depressive disorder, single episode, unspecified: Secondary | ICD-10-CM | POA: Diagnosis not present

## 2015-06-11 DIAGNOSIS — Z9049 Acquired absence of other specified parts of digestive tract: Secondary | ICD-10-CM | POA: Insufficient documentation

## 2015-06-11 DIAGNOSIS — R197 Diarrhea, unspecified: Secondary | ICD-10-CM | POA: Diagnosis not present

## 2015-06-11 DIAGNOSIS — R1011 Right upper quadrant pain: Secondary | ICD-10-CM

## 2015-06-11 DIAGNOSIS — Z87448 Personal history of other diseases of urinary system: Secondary | ICD-10-CM | POA: Diagnosis not present

## 2015-06-11 DIAGNOSIS — Z720 Tobacco use: Secondary | ICD-10-CM | POA: Insufficient documentation

## 2015-06-11 DIAGNOSIS — E669 Obesity, unspecified: Secondary | ICD-10-CM | POA: Diagnosis not present

## 2015-06-11 DIAGNOSIS — M797 Fibromyalgia: Secondary | ICD-10-CM | POA: Diagnosis not present

## 2015-06-11 DIAGNOSIS — F419 Anxiety disorder, unspecified: Secondary | ICD-10-CM | POA: Diagnosis not present

## 2015-06-11 DIAGNOSIS — Z975 Presence of (intrauterine) contraceptive device: Secondary | ICD-10-CM | POA: Diagnosis not present

## 2015-06-11 DIAGNOSIS — D649 Anemia, unspecified: Secondary | ICD-10-CM | POA: Diagnosis not present

## 2015-06-11 DIAGNOSIS — G8929 Other chronic pain: Secondary | ICD-10-CM | POA: Diagnosis not present

## 2015-06-11 DIAGNOSIS — R531 Weakness: Secondary | ICD-10-CM | POA: Diagnosis not present

## 2015-06-11 DIAGNOSIS — R932 Abnormal findings on diagnostic imaging of liver and biliary tract: Secondary | ICD-10-CM | POA: Insufficient documentation

## 2015-06-11 DIAGNOSIS — G43909 Migraine, unspecified, not intractable, without status migrainosus: Secondary | ICD-10-CM | POA: Diagnosis not present

## 2015-06-11 LAB — CBC WITH DIFFERENTIAL/PLATELET
BASOS PCT: 0 % (ref 0–1)
Basophils Absolute: 0 10*3/uL (ref 0.0–0.1)
Basophils Absolute: 0 10*3/uL (ref 0.0–0.1)
Basophils Relative: 0 % (ref 0–1)
EOS PCT: 1 % (ref 0–5)
Eosinophils Absolute: 0.1 10*3/uL (ref 0.0–0.7)
Eosinophils Absolute: 0.1 10*3/uL (ref 0.0–0.7)
Eosinophils Relative: 2 % (ref 0–5)
HCT: 32.7 % — ABNORMAL LOW (ref 36.0–46.0)
HCT: 32.4 % — ABNORMAL LOW (ref 36.0–46.0)
Hemoglobin: 10.6 g/dL — ABNORMAL LOW (ref 12.0–15.0)
Hemoglobin: 10.6 g/dL — ABNORMAL LOW (ref 12.0–15.0)
LYMPHS PCT: 35 % (ref 12–46)
Lymphocytes Relative: 33 % (ref 12–46)
Lymphs Abs: 2.6 10*3/uL (ref 0.7–4.0)
Lymphs Abs: 2.4 10*3/uL (ref 0.7–4.0)
MCH: 26.6 pg (ref 26.0–34.0)
MCH: 27 pg (ref 26.0–34.0)
MCHC: 32.4 g/dL (ref 30.0–36.0)
MCHC: 32.7 g/dL (ref 30.0–36.0)
MCV: 82 fL (ref 78.0–100.0)
MCV: 82.7 fL (ref 78.0–100.0)
MONO ABS: 1 10*3/uL (ref 0.1–1.0)
MONOS PCT: 13 % — AB (ref 3–12)
MPV: 11.1 fL (ref 8.6–12.4)
Monocytes Absolute: 1 10*3/uL (ref 0.1–1.0)
Monocytes Relative: 13 % — ABNORMAL HIGH (ref 3–12)
NEUTROS ABS: 3.9 10*3/uL (ref 1.7–7.7)
NEUTROS PCT: 53 % (ref 43–77)
Neutro Abs: 3.8 10*3/uL (ref 1.7–7.7)
Neutrophils Relative %: 51 % (ref 43–77)
PLATELETS: 235 10*3/uL (ref 150–400)
Platelets: 274 10*3/uL (ref 150–400)
RBC: 3.99 MIL/uL (ref 3.87–5.11)
RBC: 3.92 MIL/uL (ref 3.87–5.11)
RDW: 20.9 % — ABNORMAL HIGH (ref 11.5–15.5)
RDW: 19.9 % — ABNORMAL HIGH (ref 11.5–15.5)
WBC: 7.4 10*3/uL (ref 4.0–10.5)
WBC: 7.5 10*3/uL (ref 4.0–10.5)

## 2015-06-11 LAB — HEPATIC FUNCTION PANEL
ALBUMIN: 4.4 g/dL (ref 3.5–5.2)
ALT: 15 U/L (ref 0–35)
AST: 14 U/L (ref 0–37)
Alkaline Phosphatase: 85 U/L (ref 39–117)
Bilirubin, Direct: 0.1 mg/dL (ref 0.0–0.3)
Indirect Bilirubin: 0.3 mg/dL (ref 0.2–1.2)
TOTAL PROTEIN: 6.8 g/dL (ref 6.0–8.3)
Total Bilirubin: 0.4 mg/dL (ref 0.2–1.2)

## 2015-06-11 LAB — URINALYSIS, ROUTINE W REFLEX MICROSCOPIC
Bilirubin Urine: NEGATIVE
GLUCOSE, UA: NEGATIVE mg/dL
HGB URINE DIPSTICK: NEGATIVE
KETONES UR: NEGATIVE mg/dL
Nitrite: NEGATIVE
Protein, ur: NEGATIVE mg/dL
Specific Gravity, Urine: 1.01 (ref 1.005–1.030)
Urobilinogen, UA: 0.2 mg/dL (ref 0.0–1.0)
pH: 7 (ref 5.0–8.0)

## 2015-06-11 LAB — URINE MICROSCOPIC-ADD ON

## 2015-06-11 LAB — COMPREHENSIVE METABOLIC PANEL
ALK PHOS: 82 U/L (ref 38–126)
ALT: 17 U/L (ref 14–54)
AST: 21 U/L (ref 15–41)
Albumin: 4.5 g/dL (ref 3.5–5.0)
Anion gap: 10 (ref 5–15)
BILIRUBIN TOTAL: 0.4 mg/dL (ref 0.3–1.2)
BUN: 8 mg/dL (ref 6–20)
CO2: 21 mmol/L — ABNORMAL LOW (ref 22–32)
Calcium: 8.8 mg/dL — ABNORMAL LOW (ref 8.9–10.3)
Chloride: 106 mmol/L (ref 101–111)
Creatinine, Ser: 0.73 mg/dL (ref 0.44–1.00)
GFR calc non Af Amer: >60 mL/min (ref >60)
Glucose, Bld: 87 mg/dL (ref 65–99)
Potassium: 3.7 mmol/L (ref 3.5–5.1)
Sodium: 137 mmol/L (ref 135–145)
Total Protein: 7.3 g/dL (ref 6.5–8.1)

## 2015-06-11 LAB — I-STAT CG4 LACTIC ACID, ED: Lactic Acid, Venous: 0.69 mmol/L (ref 0.5–2.0)

## 2015-06-11 LAB — PREGNANCY, URINE: Preg Test, Ur: NEGATIVE

## 2015-06-11 LAB — LIPASE, BLOOD: LIPASE: 28 U/L (ref 22–51)

## 2015-06-11 LAB — ACETAMINOPHEN LEVEL: Acetaminophen (Tylenol), Serum: <10 ug/mL — ABNORMAL LOW (ref 10–30)

## 2015-06-11 IMAGING — CR DG SMALL BOWEL
2 series · 2 of 2 positions shown · non-contrast
Comparison: CT 04/07/2015

CLINICAL DATA: Right-sided abdominal pain for 1-2 months. Nausea
and vomiting for 2 weeks.

EXAM:
SMALL BOWEL SERIES
TECHNIQUE: Following ingestion of thin barium, serial small bowel images were
obtained including spot views of the terminal ileum.
FLUOROSCOPY TIME:  Radiation Exposure Index (as provided by the
fluoroscopic device):
If the device does not provide the exposure index:
Fluoroscopy Time (in minutes and seconds):  3 minutes
Number of Acquired Images:  1

[view not recorded (1 of 2)]
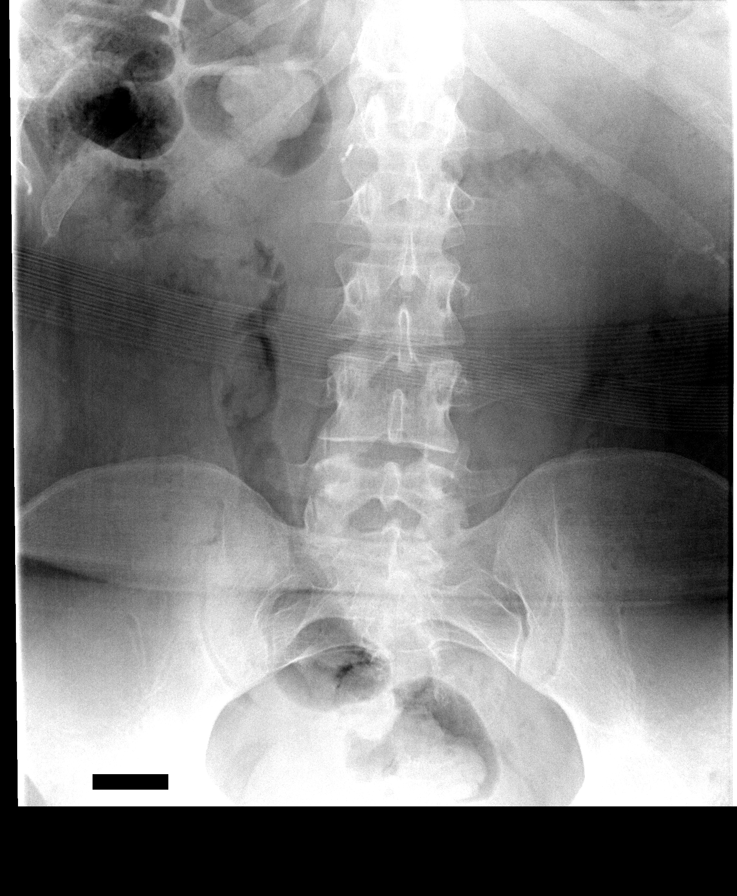

[view not recorded (2 of 2)]
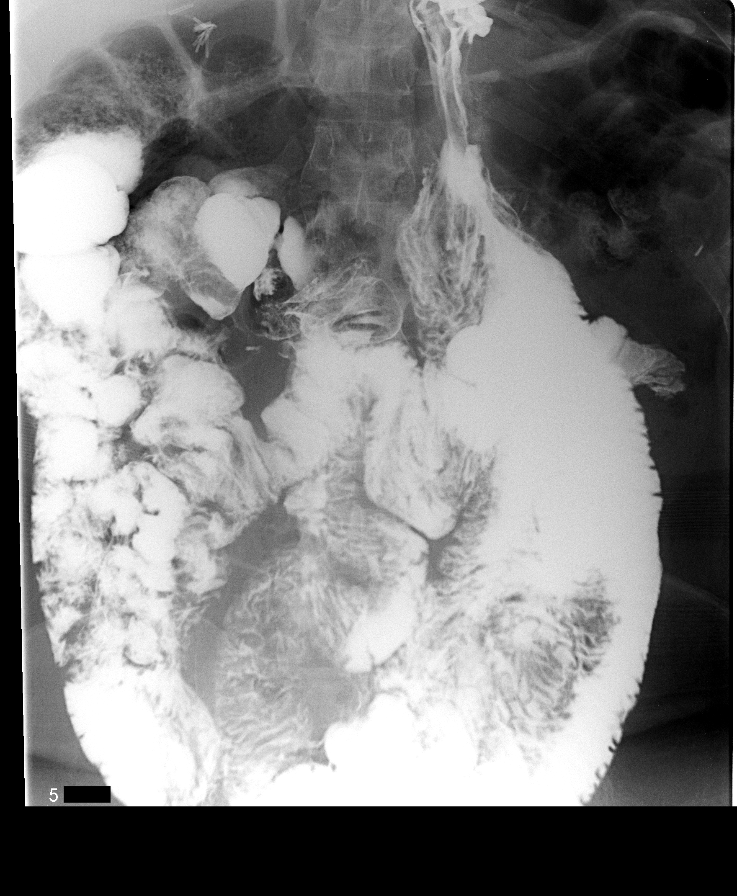

[2 of 2 positions shown; findings below may reference images not displayed]

FINDINGS: Scout film demonstrates normal bowel gas pattern. Moderate stool in
the colon.

Changes of gastric bypass. Rapid small-bowel transit time to the
colon of approximately 5 minutes. Spot compression images
demonstrate no focal small bowel abnormality. Terminal ileum
unremarkable.
IMPRESSION: Rapid small bowel transit time of approximately 5 minutes. No focal
small bowel abnormality.

## 2015-06-11 MED ORDER — OXYCODONE-ACETAMINOPHEN 5-325 MG PO TABS: 1.0000 | ORAL_TABLET | ORAL | Status: DC | PRN

## 2015-06-11 MED ORDER — HYDROMORPHONE HCL 1 MG/ML IJ SOLN
INTRAMUSCULAR | Status: AC
  Filled 2015-06-11: qty 1

## 2015-06-11 MED ORDER — SODIUM CHLORIDE 0.9 % IV BOLUS (SEPSIS)
1000.0000 mL | Freq: Once | INTRAVENOUS | Status: AC
  Administered 2015-06-11: 1000 mL via INTRAVENOUS

## 2015-06-11 MED ORDER — HYDROMORPHONE HCL 1 MG/ML IJ SOLN
1.0000 mg | Freq: Once | INTRAMUSCULAR | Status: AC
  Administered 2015-06-11: 1 mg via INTRAVENOUS

## 2015-06-11 MED ORDER — OXYCODONE-ACETAMINOPHEN 5-325 MG PO TABS
2.0000 | ORAL_TABLET | Freq: Once | ORAL | Status: AC
  Administered 2015-06-11: 2 via ORAL
  Filled 2015-06-11: qty 2

## 2015-06-11 MED ORDER — PROMETHAZINE HCL 25 MG PO TABS: 25.0000 mg | ORAL_TABLET | Freq: Four times a day (QID) | ORAL | Status: DC | PRN

## 2015-06-11 MED ORDER — FENTANYL CITRATE (PF) 100 MCG/2ML IJ SOLN
100.0000 ug | Freq: Once | INTRAMUSCULAR | Status: AC
  Administered 2015-06-11: 100 ug via INTRAVENOUS
  Filled 2015-06-11: qty 2

## 2015-06-11 MED ORDER — IOHEXOL 300 MG/ML  SOLN
100.0000 mL | Freq: Once | INTRAMUSCULAR | Status: AC | PRN
  Administered 2015-06-11: 100 mL via INTRAVENOUS

## 2015-06-11 MED ORDER — PROMETHAZINE HCL 25 MG/ML IJ SOLN
25.0000 mg | Freq: Once | INTRAMUSCULAR | Status: AC
  Administered 2015-06-11: 25 mg via INTRAVENOUS
  Filled 2015-06-11: qty 1

## 2015-06-11 MED ORDER — HYDROMORPHONE HCL 1 MG/ML IJ SOLN
1.0000 mg | Freq: Once | INTRAMUSCULAR | Status: AC
  Administered 2015-06-11: 1 mg via INTRAVENOUS
  Filled 2015-06-11: qty 1

## 2015-06-11 MED ORDER — MORPHINE SULFATE 4 MG/ML IJ SOLN: 4.0000 mg | Freq: Once | INTRAMUSCULAR | Status: DC

## 2015-06-11 MED ORDER — LORAZEPAM 2 MG/ML IJ SOLN
1.0000 mg | Freq: Once | INTRAMUSCULAR | Status: AC
  Administered 2015-06-11: 1 mg via INTRAVENOUS
  Filled 2015-06-11: qty 1

## 2015-06-11 MED ORDER — DIPHENHYDRAMINE HCL 25 MG PO CAPS
25.0000 mg | ORAL_CAPSULE | Freq: Once | ORAL | Status: AC
  Administered 2015-06-11: 25 mg via ORAL
  Filled 2015-06-11: qty 1

## 2015-06-11 NOTE — ED Notes (Signed)
Pt sent here today by MD for U/S,  Having RUQ pain, nausea, vomiting x 2 weeks, pt is pale and dizzy.

## 2015-06-11 NOTE — Patient Instructions (Signed)
CBC, Hepatic function, [name] RUQ.

## 2015-06-11 NOTE — Discharge Instructions (Signed)
Abdominal Pain Follow up with Dr. Laural Golden tomorrow for repeat liver tests. He will try to move up your [facility] appointment. Take the pain and nausea medications as prescribed. Return to the ED if you develop new or worsening symptoms. Many things can cause abdominal pain. Usually, abdominal pain is not caused by a disease and will improve without treatment. It can often be observed and treated at home. Your health care provider will do a physical exam and possibly order blood tests and X-rays to help determine the seriousness of your pain. However, in many cases, more time must pass before a clear cause of the pain can be found. Before that point, your health care provider may not know if you need more testing or further treatment. HOME CARE INSTRUCTIONS  Monitor your abdominal pain for any changes. The following actions may help to alleviate any discomfort you are experiencing:  Only take over-the-counter or prescription medicines as directed by your health care provider.  Do not take laxatives unless directed to do so by your health care provider.  Try a clear liquid diet (broth, tea, or water) as directed by your health care provider. Slowly move to a bland diet as tolerated. SEEK MEDICAL CARE IF:  You have unexplained abdominal pain.  You have abdominal pain associated with nausea or diarrhea.  You have pain when you urinate or have a bowel movement.  You experience abdominal pain that wakes you in the night.  You have abdominal pain that is worsened or improved by eating food.  You have abdominal pain that is worsened with eating fatty foods.  You have a fever. SEEK IMMEDIATE MEDICAL CARE IF:   Your pain does not go away within 2 hours.  You keep throwing up (vomiting).  Your pain is felt only in portions of the abdomen, such as the right side or the left lower portion of the abdomen.  You pass bloody or black tarry stools. MAKE SURE YOU:  Understand these instructions.    Will watch your condition.   Will get help right away if you are not doing well or get worse.  Document Released: 09/01/2005 Document Revised: 11/27/2013 Document Reviewed: 08/01/2013 [facility] Patient Information 2015 [place], [place]. This information is not intended to replace advice given to you by your health care provider. Make sure you discuss any questions you have with your health care provider.

## 2015-06-11 NOTE — ED Provider Notes (Signed)
CSN: [id number]     Arrival date & time 06/11/15  1130 History   First MD Initiated Contact with Patient 06/11/15 1155     Chief Complaint  Patient presents with  . Dizziness  . Emesis     (Consider location/radiation/quality/duration/timing/severity/associated sxs/prior Treatment) HPI Comments: Patient reports three-day history of right upper quadrant abdominal pain with worsening nausea and vomiting. Patient with history of chronic abdominal pain and is [facility] end of this month. She saw GI today and was referred to the hospital for an ultrasound which she had. She came to the ED because she is feeling poorly with nausea, dizziness, lightheadedness, generalized weakness and worsening abdominal pain. Pain is in her right upper quadrant similar to previous exacerbations of this pain. She no longer has a gallbladder. She states she's had "LFTs going up and down". No chest pain or shortness of breath. She denies any pain with urination or blood in her urine. She endorses bilateral flank pain as well. She denies fever. She denies any weakness or numbness in her extremities.  Patient is a 29 y.o. female presenting with vomiting. The history is provided by the patient.  Emesis Associated symptoms: abdominal pain, arthralgias, diarrhea, headaches and myalgias     Past Medical History  Diagnosis Date  . Migraines   . Interstitial cystitis   . IUD FEB 2010  . Gastritis JULY 2011  . Depression   . 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 Fatty liver   . Obesity (BMI 30-39.9) 2011 228 LBS BMI 36.8  . Iron deficiency anemia 07/23/2010  . Irritable bowel syndrome 2012 DIARRHEA    JUN 2012 TTG IgA 14.9  . Elevated liver enzymes JUL 2011ALK PHOS 111-127 AST  143-267 ALT  213-321T BILI 0.6  ALB  3.7-4.06 Jun 2011 ALK PHOS 118 AST 24 ALT 42 T BILI 0.4 ALB 3.9  . Chronic daily headache   . Ovarian cyst   . ADD (attention deficit disorder)   . RLQ abdominal pain 07/18/2013  .  Menorrhagia 07/18/2013  . Anxiety   . Potassium (K) deficiency   . Dysmenorrhea 09/18/2013  . Stress 09/18/2013  . Patient desires pregnancy 09/18/2013  . Pregnant 12/25/2013  . Blood transfusion without reported diagnosis   . Heart murmur   . Gastric bypass status for obesity   . 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 Dysrhythmia   . Sciatica of left side 11/14/2014  . Fibromyalgia   . 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 PONV (postoperative nausea and vomiting) seizure post-operatively   Past Surgical History  Procedure Laterality Date  . Gab  2007    in [place]-POUCH 5 CM  . Cholecystectomy  2005    biliary dyskinesia  . Cath self every nite      for sodium bicarb injection (discontinued 2013)  . Colonoscopy  JUN 2012 ABD PN/DIARRHEA WITH PROPOFOL    NL COLON  . Upper gastrointestinal endoscopy  JULY 2011 NAUSEA-D125,V6, PH 25    Bx; GASTRITIS, POUCH-5 CM LONG  . Dilation and curettage of uterus    . Tonsillectomy    . Gastric bypass  06/2006  . Savory dilation  06/20/2012    Dr. Barnie Alderman gastritis/Ulcer in the mid jejunum. Empiric dilation.   . Tonsillectomy and adenoidectomy    . Esophagogastroduodenoscopy    . Wisdom tooth extraction    . Repair vaginal cuff N/A 07/30/2014  Procedure: REPAIR VAGINAL CUFF;  Surgeon: Mora Bellman, MD;  Location: [facility];  Service: Gynecology;  Laterality: N/A;  . Hysteroscopy w/d&c N/A 09/12/2014    Procedure: DILATATION AND CURETTAGE /HYSTEROSCOPY;  Surgeon: Jonnie Kind, MD;  Location: [facility];  Service: Gynecology;  Laterality: N/A;  . Esophagogastroduodenoscopy (egd) with propofol N/A 04/10/2015    Procedure: ESOPHAGOGASTRODUODENOSCOPY (EGD) WITH PROPOFOL;  Surgeon: Daneil Dolin, MD;  Location: [facility];  Service: Endoscopy;  Laterality: N/A;  . Esophageal dilation N/A 04/10/2015    Procedure: ESOPHAGEAL DILATION WITH 54FR MALONEY DILATOR;  Surgeon: Daneil Dolin, MD;  Location: [facility];  Service:  Endoscopy;  Laterality: N/A;  . Esophageal biopsy  04/10/2015    Procedure: BIOPSY;  Surgeon: Daneil Dolin, MD;  Location: [facility];  Service: Endoscopy;;   Family History  Problem Relation Age of Onset  . Hemochromatosis Maternal Grandmother   . Migraines Maternal Grandmother   . Cancer Maternal Grandmother   . Breast cancer Maternal Grandmother   . Hypertension Father   . Diabetes Father   . Coronary artery disease Father   . Migraines Paternal Grandmother   . Breast cancer Paternal Grandmother   . Cancer Mother     breast  . Hemochromatosis Mother   . Breast cancer Mother   . Coronary artery disease Paternal Grandfather    History  Substance Use Topics  . Smoking status: Current Every Day Smoker -- 0.50 packs/day for 6 years    Types: Cigarettes  . Smokeless tobacco: Never Used     Comment: smokes 1 cigarette a day  . Alcohol Use: No   OB History    Gravida Para Term Preterm AB TAB SAB Ectopic Multiple Living   _0 Review of Systems  Constitutional: Positive for activity change, appetite change and fatigue. Negative for fever.  HENT: Negative for congestion and rhinorrhea.   Eyes: Negative for visual disturbance.  Respiratory: Negative for cough, chest tightness and shortness of breath.   Cardiovascular: Negative for chest pain.  Gastrointestinal: Positive for nausea, vomiting, abdominal pain and diarrhea.  Genitourinary: Negative for dysuria, hematuria, vaginal bleeding and vaginal discharge.  Musculoskeletal: Positive for myalgias and arthralgias.  Skin: Negative for rash.  Neurological: Positive for dizziness, weakness, light-headedness and headaches. Negative for numbness.  A complete 10 system review of systems was obtained and all systems are negative except as noted in the HPI and PMH.      Allergies  Gabapentin; Metoclopramide hcl; Codeine; Zofran; Dilaudid; Morphine and related; Latex; and Tape  Home Medications   Prior to Admission  medications   Medication Sig Start Date End Date Taking? Authorizing Provider  carbamazepine (TEGRETOL) 200 MG tablet Take 1 tablet (200 mg total) by mouth 2 (two) times daily. 05/15/15  Yes Kerrie Buffalo, NP  cyclobenzaprine (FLEXERIL) 10 MG tablet Take 1 tablet (10 mg total) by mouth 2 (two) times daily. Patient taking differently: Take by mouth as needed.  05/15/15  Yes Kerrie Buffalo, NP  DULoxetine (CYMBALTA) 60 MG capsule Take 1 capsule (60 mg total) by mouth daily. 05/15/15  Yes Kerrie Buffalo, NP  hydrOXYzine (ATARAX/VISTARIL) 25 MG tablet Take 1 tablet (25 mg total) by mouth every 6 (six) hours as needed for anxiety (sleep). 05/15/15  Yes Kerrie Buffalo, NP  phenazopyridine (PYRIDIUM) 200 MG tablet Take one tablet tid for 5 days prn 05/22/15  Yes Mikey Kirschner, MD  tiZANidine (ZANAFLEX) 4 MG  tablet Take 4 mg by mouth every 6 (six) hours as needed for muscle spasms.   Yes Historical Provider, MD  topiramate (TOPAMAX) 100 MG tablet Take 1 tablet (100 mg total) by mouth at bedtime. 05/15/15  Yes Kerrie Buffalo, NP  traZODone (DESYREL) 150 MG tablet Take 1 tablet (150 mg total) by mouth at bedtime as needed for sleep. 05/15/15  Yes Kerrie Buffalo, NP  zolpidem (AMBIEN) 10 MG tablet Take 10 mg by mouth at bedtime as needed for sleep.   Yes Historical Provider, MD  ciprofloxacin (CIPRO) 500 MG/5ML (10%) suspension Take one tsp bid for 10 days Patient not taking: Reported on 06/11/2015 05/22/15   Mikey Kirschner, MD  ferrous sulfate 325 (65 FE) MG tablet Take 1 tablet (325 mg total) by mouth 2 (two) times daily with a meal. Patient not taking: Reported on 06/11/2015 05/15/15   Kerrie Buffalo, NP  fluconazole (DIFLUCAN) 150 MG tablet Take 1 tablet (150 mg total) by mouth once. Patient not taking: Reported on 06/11/2015 05/23/15   Ripley Fraise, MD  hyoscyamine (LEVSIN SL) 0.125 MG SL tablet Place 1 tablet (0.125 mg total) under the tongue every 4 (four) hours as needed. Patient not taking: Reported on 06/11/2015  05/15/15   Kerrie Buffalo, NP  oxyCODONE-acetaminophen (PERCOCET/ROXICET) 5-325 MG per tablet Take 1 tablet by mouth every 4 (four) hours as needed for severe pain. 06/11/15   Ezequiel Essex, MD  pantoprazole (PROTONIX) 40 MG tablet Take 1 tablet (40 mg total) by mouth 2 (two) times daily before a meal. Patient not taking: Reported on 06/11/2015 05/15/15   Kerrie Buffalo, NP  promethazine (PHENERGAN) 25 MG tablet Take 1 tablet (25 mg total) by mouth every 6 (six) hours as needed for nausea or vomiting. 06/11/15   Ezequiel Essex, MD   BP 126/88 mmHg  Pulse 70  Temp(Src) 97.7 F (36.5 C) (Oral)  Resp 15  Ht _0  (1.676 m)  Wt 224 lb (101.606 kg)  BMI 36.17 kg/m2  SpO2 99%  LMP 05/21/2015 Physical Exam  Constitutional: She is oriented to person, place, and time. She appears well-developed and well-nourished. No distress.  uncomfortable  HENT:  Head: Normocephalic and atraumatic.  Mouth/Throat: Oropharynx is clear and moist. No oropharyngeal exudate.  Eyes: Conjunctivae and EOM are normal. Pupils are equal, round, and reactive to light.  Neck: Normal range of motion. Neck supple.  No meningismus.  Cardiovascular: Normal rate, regular rhythm, normal heart sounds and intact distal pulses.   No murmur heard. Pulmonary/Chest: Effort normal and breath sounds normal. No respiratory distress. She exhibits no tenderness.  Abdominal: Soft. There is tenderness. There is guarding. There is no rebound.  TTP RUQ, voluntary guarding  Musculoskeletal: Normal range of motion. She exhibits tenderness. She exhibits no edema.  Paraspinal lumbar tenderness  Neurological: She is alert and oriented to person, place, and time. No cranial nerve deficit. She exhibits normal muscle tone. Coordination normal.  No ataxia on finger to nose bilaterally. No pronator drift. 5/5 strength throughout. CN 2-12 intact. Negative Romberg. Equal grip strength. Sensation intact. Gait is normal.   Skin: Skin is warm.  Psychiatric:  She has a normal mood and affect. Her behavior is normal.  Nursing note and vitals reviewed.   ED Course  Procedures (including critical care time) Labs Review Labs Reviewed  URINALYSIS, ROUTINE W REFLEX MICROSCOPIC (NOT AT [facility]) - Abnormal; Notable for the following:    Leukocytes, UA TRACE (*)    All other components within normal limits  CBC WITH DIFFERENTIAL/PLATELET - Abnormal; Notable for the following:    Hemoglobin 10.6 (*)    HCT 32.4 (*)    RDW 19.9 (*)    Monocytes Relative 13 (*)    All other components within normal limits  COMPREHENSIVE METABOLIC PANEL - Abnormal; Notable for the following:    CO2 21 (*)    Calcium 8.8 (*)    All other components within normal limits  ACETAMINOPHEN LEVEL - Abnormal; Notable for the following:    Acetaminophen (Tylenol), Serum <10 (*)    All other components within normal limits  URINE MICROSCOPIC-ADD ON - Abnormal; Notable for the following:    Squamous Epithelial / LPF MANY (*)    Bacteria, UA FEW (*)    All other components within normal limits  PREGNANCY, URINE  LIPASE, BLOOD  I-STAT CG4 LACTIC ACID, ED  I-STAT CG4 LACTIC ACID, ED    Imaging Review Ct Abdomen Pelvis W Contrast  06/11/2015   CLINICAL DATA:  29 year old female with right upper quadrant pain with nausea and vomiting for 2 weeks. Dizziness, abnormal LFTs, blood in stool. Previous cholecystectomy and gastric bypass. Initial encounter.  EXAM: CT ABDOMEN AND PELVIS WITH CONTRAST  TECHNIQUE: Multidetector CT imaging of the abdomen and pelvis was performed using the standard protocol following bolus administration of intravenous contrast.  CONTRAST:  121m OMNIPAQUE IOHEXOL 300 MG/ML  SOLN  COMPARISON:  CT Abdomen and Pelvis 04/07/2015 and earlier.  FINDINGS: Larger lung volumes today. Lung bases are clear. No pericardial or pleural effusion.  No acute osseous abnormality identified.  No pelvic free fluid. Negative uterus and adnexa. Negative distal colon aside from  redundancy and retained stool. Diminutive bladder.  Negative left colon aside from retained stool. Negative transverse colon. Negative right colon aside from retained stool. Normal appendix (series 2, image 59). Oral contrast has just reached the cecum. No dilated or abnormal small bowel loops. Sequelae of gastric bypass with gastrojejunostomy. Trace fluid in the distal stomach and duodenum.  Surgically absent gallbladder. Chronic but increased intra- and extrahepatic biliary ductal dilatation today. The CBD today measures up to 10 mm diameter and tapers distally. No obstructing lesion is identified. The CBD was 6-8 mm in May. There also appears to be mild periportal edema. No abdominal free fluid. Liver enhancement is within normal limits. Portal venous system is patent.  Spleen, pancreas, adrenal glands, and kidneys are within normal limits. Major arterial structures in the abdomen and pelvis are patent. No lymphadenopathy.  IMPRESSION: 1. New mild periportal edema and intra-, extrahepatic biliary ductal enlargement. No obstructing etiology is identified by CT. Gallbladder is chronically absent. 2. Otherwise negative CT abdomen and pelvis status post gastric bypass with gastrojejunostomy. Normal appendix.   Electronically Signed   By: HGenevie AnnM.D.   On: 06/11/2015 14:42   [place]Abdomen Limited Ruq  06/11/2015   CLINICAL DATA:  Right upper quadrant abdominal pain for 2 months. Elevated transaminase.  EXAM: [place]DOMEN LIMITED - RIGHT UPPER QUADRANT  COMPARISON:  CT abdomen and pelvis 04/07/2015  FINDINGS: Examination was mildly limited by patient body habitus and difficulty with breath holding.  Gallbladder:  Surgically absent. No abnormality identified in the gallbladder fossa.  Common bile duct:  Diameter: 5 mm  Liver:  Diffusely increased parenchymal echogenicity without focal lesion identified.  IMPRESSION: 1. Hyperechoic liver, suggestive of hepatic steatosis. 2. Prior cholecystectomy.  No biliary dilatation.    Electronically Signed   By: ALogan Bores  On: 06/11/2015 11:25  EKG Interpretation   Date/Time:  Wednesday June 11 2015 12:37:21 EDT Ventricular Rate:  70 PR Interval:  174 QRS Duration: 98 QT Interval:  417 QTC Calculation: 450 R Axis:   48 Text Interpretation:  Sinus rhythm Low voltage, precordial leads No  significant change was found Confirmed by Wyvonnia Dusky  MD, Sami Froh [phone]) on  06/11/2015 12:41:00 PM      MDM   Final diagnoses:  RUQ pain  Nausea and vomiting, vomiting of unspecified type   -Acute and chronic right upper quadrant pain with nausea and vomiting. Outpatient ultrasound shows hepatic steatosis and prior cholecystectomy.  Right upper quadrant tenderness with guarding.  Hemoglobin stable. Patient reported blood in her stool overnight but was Hemoccult-negative at the GI office earlier today.  LFTs and lipase normal. Patient given IV fluids, antiemetics and pain medication.  CT results d/w Dr. Laural Golden. He feels patient needs endoscopic ultrasound or ERCP at [facility]. He does not feel she needs admission today. He advises he will call patient tomorrow to repeat her LFTs and try to move up her [facility] appointment. Patient has no fever or leukocytosis. No evidence of cholangitis. Dr. Laural Golden feel she likely has microscopic stones in her biliary ducts causing intermittent dilation.   Orthostatics positive.  IVF and PO fluids given.  No vomiting in the ED. Tolerating PO. D/w patient and family.  Dr. Laural Golden states he will call patient tomorrow for repeat LFTs and attempt to move up her [facility] appointment.  Return precautions discussed.  BP 126/88 mmHg  Pulse 70  Temp(Src) 97.7 F (36.5 C) (Oral)  Resp 15  Ht _0  (1.676 m)  Wt 224 lb (101.606 kg)  BMI 36.17 kg/m2  SpO2 99%  LMP 05/21/2015   Ezequiel Essex, MD 06/11/15 2217

## 2015-06-11 NOTE — ED Notes (Signed)
PT able to drink PO apple juice, reports it makes the pain worse, EDP made aware.

## 2015-06-11 NOTE — Progress Notes (Signed)
Subjective:    Patient ID: Penny [name], female    DOB: April 04, 1986, 29 y.o.   MRN: [id number]  HPI  She was last seen in May as a self referral. Her liver enzymes have been elevated in the past off and on.  She has an appt at [facility] 07/02/2015. She states for the past 3-4 days she has RUQ pain. Last night the pain was bad.  She said when she went to the [place] at work last night she had blood in her stool. She describes as a small amount of blood. She was very weak.  She says she had a BM this am, and she saw blood. She works in the ED at [employer]. Rates the pain 7/10.  Has appt at [facility] 07/02/2015. Appetite has not been good. She has lost 10 pounds since May. There has been no fever. No nausea or vomiting.  GAB Mini gastric bypass by Dr. Wendee Copp in [facility] in 2007. GB removed 2005 by DR. Smith for non-functioning gallbladder.  Colonoscopy 06/03/2011 for abdominal pain and diarrhea. Some rectal bleeding. Dr. Oneida Alar. Extremely tortuous (redundant transverse and sigmoid colon). Normal terminal ileum approximately 10-15 cm visualized. No polyps, masses, inflammatory changes, diverticular AVMs seen.      Hepatic Function Latest Ref Rng 05/22/2015 05/13/2015 05/09/2015  Total Protein 6.5 - 8.1 g/dL 7.6 7.0 6.8  Albumin 3.5 - 5.0 g/dL 4.4 4.1 4.0  AST 15 - 41 U/L _0 ALT 14 - 54 U/L 29 46 41  Alk Phosphatase 38 - 126 U/L 94 94 90  Total Bilirubin 0.3 - 1.2 mg/dL 0.4 0.6 0.5  Bilirubin, Direct 0.0 - 0.3 mg/dL - - -     04/07/2015 Hep. C antibody negative. Hep B surface antigen negative, HIV non reactive. 04/10/2015 EGD/ED: dysphagia, abdominal pain: Dr. Gala Romney Normal appearing esophagus-status post [facility] dilator. Surgical altered stomach. Anastomotic ulcer present-status post biopsyBiopsy: Benign small bowel-type mucosa with focal erosion. Negative for H. pylori. No dysplasia or malignancy.  07/02/2010 EGD with cold forceps biopsy of gastric mucosa/anastomosis. Dr.  Oneida Alar: IMPRESSION: Patient is status post gastric bypass procedure without complicating feature appreciable. Currently there is no bowel obstruction. No  abscess. Appendix region appears normal.  Mild common bile duct prominence is stable and is probably secondary to post cholecystectomy state. No biliary duct mass or calculus STOMACH, BIOPSY, : - REACTIVE CHANGES WITH INFLAMMATION AND FOCAL EROSION. - no helicobacter pylori, dysplasia or malignancy identified.  07/02/2010 MRI Abdomen: elevated liver enzymes:  IMPRESSION:  1. No significant abnormality within the abdomen by MRI. 2. Minimal fatty infiltration of the liver. 3. No right renal lesion identified.      Review of Systems Past Medical History  Diagnosis Date  . Migraines   . Interstitial cystitis   . IUD FEB 2010  . Gastritis JULY 2011  . Depression   . 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 Fatty liver   . Obesity (BMI 30-39.9) 2011 228 LBS BMI 36.8  . Iron deficiency anemia 07/23/2010  . Irritable bowel syndrome 2012 DIARRHEA    JUN 2012 TTG IgA 14.9  . Elevated liver enzymes JUL 2011ALK PHOS 111-127 AST  143-267 ALT  213-321T BILI 0.6  ALB  3.7-4.06 Jun 2011 ALK PHOS 118 AST 24 ALT 42 T BILI 0.4 ALB 3.9  . Chronic daily headache   . Ovarian cyst   . ADD (attention deficit disorder)   . RLQ  abdominal pain 07/18/2013  . Menorrhagia 07/18/2013  . Anxiety   . Potassium (K) deficiency   . Dysmenorrhea 09/18/2013  . Stress 09/18/2013  . Patient desires pregnancy 09/18/2013  . Pregnant 12/25/2013  . Blood transfusion without reported diagnosis   . Heart murmur   . Gastric bypass status for obesity   . 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 Dysrhythmia   . Sciatica of left side 11/14/2014  . Fibromyalgia   . 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 PONV (postoperative nausea and vomiting) seizure post-operatively    Past Surgical History  Procedure Laterality Date  .  Gab  2007    in [place]-POUCH 5 CM  . Cholecystectomy  2005    biliary dyskinesia  . Cath self every nite      for sodium bicarb injection (discontinued 2013)  . Colonoscopy  JUN 2012 ABD PN/DIARRHEA WITH PROPOFOL    NL COLON  . Upper gastrointestinal endoscopy  JULY 2011 NAUSEA-D125,V6, PH 25    Bx; GASTRITIS, POUCH-5 CM LONG  . Dilation and curettage of uterus    . Tonsillectomy    . Gastric bypass  06/2006  . Savory dilation  06/20/2012    Dr. Barnie Alderman gastritis/Ulcer in the mid jejunum. Empiric dilation.   . Tonsillectomy and adenoidectomy    . Esophagogastroduodenoscopy    . Wisdom tooth extraction    . Repair vaginal cuff N/A 07/30/2014    Procedure: REPAIR VAGINAL CUFF;  Surgeon: Mora Bellman, MD;  Location: [facility];  Service: Gynecology;  Laterality: N/A;  . Hysteroscopy w/d&c N/A 09/12/2014    Procedure: DILATATION AND CURETTAGE /HYSTEROSCOPY;  Surgeon: Jonnie Kind, MD;  Location: [facility];  Service: Gynecology;  Laterality: N/A;  . Esophagogastroduodenoscopy (egd) with propofol N/A 04/10/2015    Procedure: ESOPHAGOGASTRODUODENOSCOPY (EGD) WITH PROPOFOL;  Surgeon: Daneil Dolin, MD;  Location: [facility];  Service: Endoscopy;  Laterality: N/A;  . Esophageal dilation N/A 04/10/2015    Procedure: ESOPHAGEAL DILATION WITH 54FR MALONEY DILATOR;  Surgeon: Daneil Dolin, MD;  Location: [facility];  Service: Endoscopy;  Laterality: N/A;  . Esophageal biopsy  04/10/2015    Procedure: BIOPSY;  Surgeon: Daneil Dolin, MD;  Location: [facility];  Service: Endoscopy;;    Allergies  Allergen Reactions  . Gabapentin Other (See Comments)    Pt states that she was unresponsive after taking this medication, but her vitals remained stable.    . Metoclopramide Hcl Anxiety and Other (See Comments)    Pt states that she felt like she was trapped in a box, and could not get out.  Pt also states that she had temporary loss of movement, weakness, and tingling.    . Codeine Nausea And Vomiting  . Zofran  [Ondansetron] Other (See Comments)    Migraines   . Latex Rash  . Tape Itching and Rash    Please use paper tape    Current Outpatient Prescriptions on File Prior to Visit  Medication Sig Dispense Refill  . carbamazepine (TEGRETOL) 200 MG tablet Take 1 tablet (200 mg total) by mouth 2 (two) times daily. 60 tablet 0  . cyclobenzaprine (FLEXERIL) 10 MG tablet Take 1 tablet (10 mg total) by mouth 2 (two) times daily. (Patient taking differently: Take by mouth as needed. ) 30 tablet 0  . DULoxetine (CYMBALTA) 60 MG capsule Take 1 capsule (60 mg total) by mouth daily. 30 capsule 0  . pantoprazole (PROTONIX) 40 MG tablet Take 1 tablet (40 mg  total) by mouth 2 (two) times daily before a meal. 60 tablet 1  . topiramate (TOPAMAX) 100 MG tablet Take 1 tablet (100 mg total) by mouth at bedtime. 30 tablet 3  . traZODone (DESYREL) 150 MG tablet Take 1 tablet (150 mg total) by mouth at bedtime as needed for sleep. 30 tablet 0  . ciprofloxacin (CIPRO) 500 MG/5ML (10%) suspension Take one tsp bid for 10 days (Patient not taking: Reported on 06/11/2015) 100 mL 0  . ferrous sulfate 325 (65 FE) MG tablet Take 1 tablet (325 mg total) by mouth 2 (two) times daily with a meal. (Patient not taking: Reported on 06/11/2015) 60 tablet 0  . fluconazole (DIFLUCAN) 150 MG tablet Take 1 tablet (150 mg total) by mouth once. (Patient not taking: Reported on 06/11/2015) 1 tablet 0  . hydrOXYzine (ATARAX/VISTARIL) 25 MG tablet Take 1 tablet (25 mg total) by mouth every 6 (six) hours as needed for anxiety (sleep). (Patient not taking: Reported on 06/11/2015) 30 tablet 0  . hyoscyamine (LEVSIN SL) 0.125 MG SL tablet Place 1 tablet (0.125 mg total) under the tongue every 4 (four) hours as needed. (Patient not taking: Reported on 06/11/2015) 90 tablet 3  . phenazopyridine (PYRIDIUM) 200 MG tablet Take one tablet tid for 5 days prn (Patient not taking: Reported on 06/11/2015) 15 tablet 0   No current facility-administered medications on file  prior to visit.        Objective:   Physical Exam Blood pressure 102/68, pulse 72, temperature 98.4 F (36.9 C), height _0  (1.676 m), weight 224 lb 14.4 oz (102.014 kg), not currently breastfeeding. Alert and oriented. Skin warm and dry. Oral mucosa is moist.   . Sclera anicteric, conjunctivae is pink. Thyroid not enlarged. No cervical lymphadenopathy. Lungs clear. Heart regular rate and rhythm.  Abdomen is soft. Bowel sounds are positive. No hepatomegaly. No abdominal masses felt. Tenderness rt upper quadrant.  No edema to lower extremities.  Guaiac negative in office today    Developer: [phone] ( 10/2018) Card: Lot [id number] (12/18)    Assessment & Plan:  RUQ pain. Hx of elevated liver enzymes. Has appt at [facility] this month. Two episodes of rectal bleeding today per patient. She was guaiac negative in office. Will get an Hepatic function, CBC, and [name] RUQ today. Pain medications held for now Further recommendations to follow.

## 2015-06-12 ENCOUNTER — Other Ambulatory Visit (HOSPITAL_COMMUNITY)
Admission: RE | Admit: 2015-06-12 | Discharge: 2015-06-12 | Disposition: A | Discharge status: Home / Self Care | Payer: 59 | Source: Ambulatory Visit | Attending: Internal Medicine | Admitting: Internal Medicine

## 2015-06-12 ENCOUNTER — Telehealth (INDEPENDENT_AMBULATORY_CARE_PROVIDER_SITE_OTHER): Payer: Self-pay | Admitting: *Deleted

## 2015-06-12 DIAGNOSIS — R748 Abnormal levels of other serum enzymes: Secondary | ICD-10-CM | POA: Insufficient documentation

## 2015-06-12 LAB — HEPATIC FUNCTION PANEL
ALBUMIN: 4.1 g/dL (ref 3.5–5.0)
ALK PHOS: 109 U/L (ref 38–126)
ALT: 72 U/L — AB (ref 14–54)
AST: 66 U/L — AB (ref 15–41)
BILIRUBIN DIRECT: 0.1 mg/dL (ref 0.1–0.5)
BILIRUBIN TOTAL: 0.6 mg/dL (ref 0.3–1.2)
Indirect Bilirubin: 0.5 mg/dL (ref 0.3–0.9)
Total Protein: 6.8 g/dL (ref 6.5–8.1)

## 2015-06-12 NOTE — Telephone Encounter (Signed)
Per Dr.Rehman the patient will need to have labs drawn at [facility], [place]. They are to call Dr.Rehman with the results @ ext. [place]

## 2015-06-17 ENCOUNTER — Other Ambulatory Visit (INDEPENDENT_AMBULATORY_CARE_PROVIDER_SITE_OTHER): Payer: Self-pay | Admitting: Internal Medicine

## 2015-06-17 MED ORDER — OXYCODONE-ACETAMINOPHEN 5-325 MG PO TABS: 1.0000 | ORAL_TABLET | Freq: Four times a day (QID) | ORAL | Status: DC | PRN

## 2015-06-20 ENCOUNTER — Encounter: Payer: Self-pay | Admitting: Family Medicine

## 2015-06-20 ENCOUNTER — Ambulatory Visit (INDEPENDENT_AMBULATORY_CARE_PROVIDER_SITE_OTHER): Payer: 59 | Admitting: Family Medicine

## 2015-06-20 VITALS — Temp 97.8°F | Ht 66.0 in | Wt 220.6 lb

## 2015-06-20 DIAGNOSIS — J019 Acute sinusitis, unspecified: Secondary | ICD-10-CM | POA: Diagnosis not present

## 2015-06-20 DIAGNOSIS — J340 Abscess, furuncle and carbuncle of nose: Secondary | ICD-10-CM

## 2015-06-20 DIAGNOSIS — B9689 Other specified bacterial agents as the cause of diseases classified elsewhere: Secondary | ICD-10-CM

## 2015-06-20 MED ORDER — FLUCONAZOLE 150 MG PO TABS: 150.0000 mg | ORAL_TABLET | Freq: Once | ORAL | Status: DC

## 2015-06-20 MED ORDER — DOXYCYCLINE HYCLATE 100 MG PO CAPS: 100.0000 mg | ORAL_CAPSULE | Freq: Two times a day (BID) | ORAL | Status: DC

## 2015-06-20 NOTE — Progress Notes (Signed)
   Subjective:    Patient ID: Penny [name], female    DOB: 1985/12/18, 29 y.o.   MRN: [id number]  Cough This is a new problem. The current episode started in the past 7 days. Associated symptoms include headaches, nasal congestion, a sore throat and wheezing.   Woozy, head congestion, aching in sinus,sweating,no appetite, some wheezing Patient also relates some soreness at the end of her nose denies high fever chills sweats  Review of Systems  HENT: Positive for sore throat.   Respiratory: Positive for cough and wheezing.   Neurological: Positive for headaches.       Objective:   Physical Exam  Constitutional: She appears well-developed.  HENT:  Head: Normocephalic.  Nose: Nose normal.  Mouth/Throat: Oropharynx is clear and moist. No oropharyngeal exudate.  Neck: Neck supple.  Cardiovascular: Normal rate and normal heart sounds.   No murmur heard. Pulmonary/Chest: Effort normal and breath sounds normal. She has no wheezes.  Lymphadenopathy:    She has no cervical adenopathy.  Skin: Skin is warm and dry.  Nursing note and vitals reviewed.  Cellulitis of the nose       Assessment & Plan:  Mild cellulitis of the nose Secondary sinusitis Viral upper respiratory Doxycycline should cover staph as well as sinus infection follow-up if high fevers difficulty breathing or worse or go to ER

## 2015-06-23 ENCOUNTER — Telehealth (INDEPENDENT_AMBULATORY_CARE_PROVIDER_SITE_OTHER): Payer: Self-pay | Admitting: *Deleted

## 2015-06-23 NOTE — Telephone Encounter (Signed)
Per Dr.Rehman  - he has not recd' the results. However , he knows that they are normal , except for the ANA slightly elevated. Patient does not have AutoImmune Disease. They are going to arrange a MRCP as planned. Patient will be called and made aware.

## 2015-06-23 NOTE — Telephone Encounter (Signed)
Penny [name] would like to speak with Dr. Laural Golden. She is wanting to know if he has received the results of her blood work. The return phone number is [phone].

## 2015-06-25 ENCOUNTER — Telehealth (INDEPENDENT_AMBULATORY_CARE_PROVIDER_SITE_OTHER): Payer: Self-pay | Admitting: *Deleted

## 2015-06-25 ENCOUNTER — Encounter (HOSPITAL_COMMUNITY): Payer: Self-pay | Admitting: Psychiatry

## 2015-06-25 ENCOUNTER — Ambulatory Visit (INDEPENDENT_AMBULATORY_CARE_PROVIDER_SITE_OTHER): Payer: 59 | Admitting: Psychiatry

## 2015-06-25 VITALS — BP 111/64 | HR 68 | Ht 66.0 in | Wt 221.4 lb

## 2015-06-25 DIAGNOSIS — F329 Major depressive disorder, single episode, unspecified: Secondary | ICD-10-CM | POA: Diagnosis not present

## 2015-06-25 DIAGNOSIS — F32A Depression, unspecified: Secondary | ICD-10-CM

## 2015-06-25 MED ORDER — TOPIRAMATE 100 MG PO TABS: 100.0000 mg | ORAL_TABLET | Freq: Every day | ORAL | Status: DC

## 2015-06-25 MED ORDER — ALPRAZOLAM 0.5 MG PO TABS: 0.5000 mg | ORAL_TABLET | Freq: Three times a day (TID) | ORAL | Status: DC | PRN

## 2015-06-25 MED ORDER — CARBAMAZEPINE 200 MG PO TABS: ORAL_TABLET | ORAL | Status: DC

## 2015-06-25 MED ORDER — TRAZODONE HCL 150 MG PO TABS: 150.0000 mg | ORAL_TABLET | Freq: Every evening | ORAL | Status: DC | PRN

## 2015-06-25 MED ORDER — DULOXETINE HCL 60 MG PO CPEP: 60.0000 mg | ORAL_CAPSULE | Freq: Every day | ORAL | Status: DC
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
## 2015-06-25 NOTE — Telephone Encounter (Signed)
Patient was called @9 :22 am, and a message was left on her voicemail with Dr.Rehman's recommendations.

## 2015-06-25 NOTE — Telephone Encounter (Signed)
Per Dr.Rehman the patient to go to the ED at [facility]. Patient called , she said she was told that they were told to handle the imaging , and Dr.Rehman was to handle her pain meds. She says that she spent 8 hours at [facility] only yo be told they could do nothing. Patient states that she is taking pain meds but trying to only take when absolutely needed , every 4 hours. Its been so bad. The pain patches are not working any longer. Narcotics , she admits that she has to take 2 at the time to get any kind of relief/to function. Unable to eat , mainly your intake in liquid. She also states that she has been on a lot of antibiotics. Patient states that she lost her job last week because of being sick.  Patient may be called back at [phone]

## 2015-06-25 NOTE — Telephone Encounter (Signed)
Penny [name] called requesting to speak with Penny [name]. Advised patient she is in a room with a patient could I take the message. "I am needing Penny [name], Penny [name] or Penny [name] to call her that she is in freaking pain and someone need to do something about this. They need to stop looking in her freaking chart 4 years ago about the pain medicine and do something. She has two small children and can not handle this with the freaking pain." told Day, everyone is trying to help her but do know for sure Penny [name] doesn't prescribe pain meds. Penny [name] I would give Penny [name] the message, "I don't want you to give a message, I want to the have them call me". Penny [name] I could not make anyone in this office do anything, that I would give them the message for I was trying to help her.

## 2015-06-26 ENCOUNTER — Other Ambulatory Visit (INDEPENDENT_AMBULATORY_CARE_PROVIDER_SITE_OTHER): Payer: Self-pay | Admitting: Internal Medicine

## 2015-06-26 MED ORDER — OXYCODONE-ACETAMINOPHEN 10-325 MG PO TABS: 1.0000 | ORAL_TABLET | Freq: Four times a day (QID) | ORAL | Status: DC | PRN

## 2015-06-26 NOTE — Telephone Encounter (Signed)
[name] and her mother both have called in wanting Dr. Laural Golden to know going to the ED is just a waste of time and money. She need help just until her MRI on 07/07/15. Advised [name] will let Dr. Laural Golden know.

## 2015-06-26 NOTE — Telephone Encounter (Signed)
Patient's call returned. MRCP scheduled at [facility] on 02/23/2015. Will change pain medication and give her 1 week at a time.

## 2015-06-27 NOTE — Telephone Encounter (Signed)
Noted  
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
## 2015-07-03 ENCOUNTER — Other Ambulatory Visit: Payer: Self-pay | Admitting: *Deleted

## 2015-07-03 ENCOUNTER — Telehealth: Payer: Self-pay | Admitting: *Deleted

## 2015-07-03 MED ORDER — FLUCONAZOLE 150 MG PO TABS
150.0000 mg | ORAL_TABLET | Freq: Once | ORAL | Status: DC
Start: 2015-07-03 — End: 2015-08-01

## 2015-07-03 MED ORDER — SULFAMETHOXAZOLE-TRIMETHOPRIM 800-160 MG PO TABS: 1.0000 | ORAL_TABLET | Freq: Two times a day (BID) | ORAL | Status: DC

## 2015-07-03 NOTE — Telephone Encounter (Signed)
Pt seen 7/15. Not any better. Pt states she has been on 3 rounds of antibiotics. Been to [facility] ER. Cellulitis on nose not any better. Still having low back pain and burning with urination. Back pain hurts to move. Stop taking pain med because it was not helping. Just making her sleepy. No fever, having sinus pressure and yellow to green congestion. Also a yeast infection had diflucan  On 6/17 and 7/15.

## 2015-07-03 NOTE — Telephone Encounter (Signed)
bactim ds bid ten d, difl 150 one p o three d apart, no pain meds use otc, f u dr Nicki Reaper next wk if persist

## 2015-07-03 NOTE — Telephone Encounter (Signed)
meds sent to pharm. Pt notified on voicemail and to follow up next week with dr Nicki Reaper if not better

## 2015-07-04 ENCOUNTER — Encounter (HOSPITAL_COMMUNITY): Payer: Self-pay | Admitting: Emergency Medicine

## 2015-07-04 ENCOUNTER — Telehealth: Payer: Self-pay | Admitting: Family Medicine

## 2015-07-04 ENCOUNTER — Emergency Department (HOSPITAL_COMMUNITY)
Admission: EM | Admit: 2015-07-04 | Discharge: 2015-07-04 | Discharge status: Against Medical Advice | Payer: 59 | Attending: Emergency Medicine | Admitting: Emergency Medicine

## 2015-07-04 DIAGNOSIS — Z91040 Latex allergy status: Secondary | ICD-10-CM | POA: Insufficient documentation

## 2015-07-04 DIAGNOSIS — M545 Low back pain: Secondary | ICD-10-CM | POA: Diagnosis not present

## 2015-07-04 DIAGNOSIS — G40909 Epilepsy, unspecified, not intractable, without status epilepticus: Secondary | ICD-10-CM | POA: Diagnosis not present

## 2015-07-04 DIAGNOSIS — G43909 Migraine, unspecified, not intractable, without status migrainosus: Secondary | ICD-10-CM | POA: Diagnosis not present

## 2015-07-04 DIAGNOSIS — R011 Cardiac murmur, unspecified: Secondary | ICD-10-CM | POA: Diagnosis not present

## 2015-07-04 DIAGNOSIS — G8929 Other chronic pain: Secondary | ICD-10-CM | POA: Diagnosis not present

## 2015-07-04 DIAGNOSIS — E669 Obesity, unspecified: Secondary | ICD-10-CM | POA: Insufficient documentation

## 2015-07-04 DIAGNOSIS — R634 Abnormal weight loss: Secondary | ICD-10-CM | POA: Insufficient documentation

## 2015-07-04 DIAGNOSIS — N390 Urinary tract infection, site not specified: Secondary | ICD-10-CM | POA: Diagnosis not present

## 2015-07-04 DIAGNOSIS — Z862 Personal history of diseases of the blood and blood-forming organs and certain disorders involving the immune mechanism: Secondary | ICD-10-CM | POA: Diagnosis not present

## 2015-07-04 DIAGNOSIS — Z8742 Personal history of other diseases of the female genital tract: Secondary | ICD-10-CM | POA: Diagnosis not present

## 2015-07-04 DIAGNOSIS — R109 Unspecified abdominal pain: Secondary | ICD-10-CM | POA: Diagnosis present

## 2015-07-04 DIAGNOSIS — Z8719 Personal history of other diseases of the digestive system: Secondary | ICD-10-CM | POA: Diagnosis not present

## 2015-07-04 DIAGNOSIS — F419 Anxiety disorder, unspecified: Secondary | ICD-10-CM | POA: Insufficient documentation

## 2015-07-04 DIAGNOSIS — Z720 Tobacco use: Secondary | ICD-10-CM | POA: Diagnosis not present

## 2015-07-04 DIAGNOSIS — M797 Fibromyalgia: Secondary | ICD-10-CM | POA: Diagnosis not present

## 2015-07-04 DIAGNOSIS — F329 Major depressive disorder, single episode, unspecified: Secondary | ICD-10-CM | POA: Diagnosis not present

## 2015-07-04 DIAGNOSIS — Z79899 Other long term (current) drug therapy: Secondary | ICD-10-CM | POA: Insufficient documentation

## 2015-07-04 DIAGNOSIS — Z9049 Acquired absence of other specified parts of digestive tract: Secondary | ICD-10-CM | POA: Diagnosis not present

## 2015-07-04 HISTORY — DX: Conversion disorder with seizures or convulsions: F44.5

## 2015-07-04 LAB — I-STAT CHEM 8, ED
BUN: 10 mg/dL (ref 6–20)
Calcium, Ion: 1.24 mmol/L — ABNORMAL HIGH (ref 1.12–1.23)
Chloride: 107 mmol/L (ref 101–111)
Creatinine, Ser: 0.7 mg/dL (ref 0.44–1.00)
Glucose, Bld: 96 mg/dL (ref 65–99)
HEMATOCRIT: 35 % — AB (ref 36.0–46.0)
Hemoglobin: 11.9 g/dL — ABNORMAL LOW (ref 12.0–15.0)
Potassium: 3.7 mmol/L (ref 3.5–5.1)
Sodium: 143 mmol/L (ref 135–145)
TCO2: 20 mmol/L (ref 0–100)

## 2015-07-04 LAB — URINALYSIS, ROUTINE W REFLEX MICROSCOPIC
GLUCOSE, UA: 100 mg/dL — AB
HGB URINE DIPSTICK: NEGATIVE
NITRITE: POSITIVE — AB
PROTEIN: 30 mg/dL — AB
Specific Gravity, Urine: 1.025 (ref 1.005–1.030)
UROBILINOGEN UA: 2 mg/dL — AB (ref 0.0–1.0)
pH: 5 (ref 5.0–8.0)

## 2015-07-04 LAB — URINE MICROSCOPIC-ADD ON

## 2015-07-04 MED ORDER — PROMETHAZINE HCL 12.5 MG PO TABS
12.5000 mg | ORAL_TABLET | Freq: Once | ORAL | Status: AC
  Administered 2015-07-04: 12.5 mg via ORAL
  Filled 2015-07-04: qty 1

## 2015-07-04 MED ORDER — SULFAMETHOXAZOLE-TRIMETHOPRIM 800-160 MG PO TABS
1.0000 | ORAL_TABLET | Freq: Once | ORAL | Status: DC
  Filled 2015-07-04: qty 1

## 2015-07-04 NOTE — Telephone Encounter (Signed)
Pt called stating that she is unable to keep the antibiotics down that she was given yesterday. Pt wants to know what else can be done.

## 2015-07-04 NOTE — ED Provider Notes (Signed)
CSN: [id number]     Arrival date & time 07/04/15  1157 History   First MD Initiated Contact with Patient 07/04/15 1232     Chief Complaint  Patient presents with  . Flank Pain     (Consider location/radiation/quality/duration/timing/severity/associated sxs/prior Treatment) Patient is a 29 y.o. female presenting with vomiting.  Emesis Severity:  Mild Duration:  4 months Timing:  Constant Quality:  Stomach contents Able to tolerate:  Liquids Progression:  Unchanged Chronicity:  New Recent urination:  Normal Relieved by:  Nothing Worsened by:  Nothing tried Ineffective treatments:  None tried Associated symptoms: no abdominal pain, no chills and no fever   Risk factors: no alcohol use and no suspect food intake     Past Medical History  Diagnosis Date  . Migraines   . Interstitial cystitis   . IUD FEB 2010  . Gastritis JULY 2011  . Depression   . 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 Fatty liver   . Obesity (BMI 30-39.9) 2011 228 LBS BMI 36.8  . Iron deficiency anemia 07/23/2010  . Irritable bowel syndrome 2012 DIARRHEA    JUN 2012 TTG IgA 14.9  . Elevated liver enzymes JUL 2011ALK PHOS 111-127 AST  143-267 ALT  213-321T BILI 0.6  ALB  3.7-4.06 Jun 2011 ALK PHOS 118 AST 24 ALT 42 T BILI 0.4 ALB 3.9  . Chronic daily headache   . Ovarian cyst   . ADD (attention deficit disorder)   . RLQ abdominal pain 07/18/2013  . Menorrhagia 07/18/2013  . Anxiety   . Potassium (K) deficiency   . Dysmenorrhea 09/18/2013  . Stress 09/18/2013  . Patient desires pregnancy 09/18/2013  . Pregnant 12/25/2013  . Blood transfusion without reported diagnosis   . Heart murmur   . Gastric bypass status for obesity   . 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 Dysrhythmia   . Sciatica of left side 11/14/2014  . Fibromyalgia   . 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 PONV (postoperative nausea and vomiting) seizure post-operatively  . Psychiatric pseudoseizure   .  Depression    Past Surgical History  Procedure Laterality Date  . Gab  2007    in [place]-POUCH 5 CM  . Cholecystectomy  2005    biliary dyskinesia  . Cath self every nite      for sodium bicarb injection (discontinued 2013)  . Colonoscopy  JUN 2012 ABD PN/DIARRHEA WITH PROPOFOL    NL COLON  . Upper gastrointestinal endoscopy  JULY 2011 NAUSEA-D125,V6, PH 25    Bx; GASTRITIS, POUCH-5 CM LONG  . Dilation and curettage of uterus    . Tonsillectomy    . Gastric bypass  06/2006  . Savory dilation  06/20/2012    Dr. Barnie Alderman gastritis/Ulcer in the mid jejunum. Empiric dilation.   . Tonsillectomy and adenoidectomy    . Esophagogastroduodenoscopy    . Wisdom tooth extraction    . Repair vaginal cuff N/A 07/30/2014    Procedure: REPAIR VAGINAL CUFF;  Surgeon: Mora Bellman, MD;  Location: [facility];  Service: Gynecology;  Laterality: N/A;  . Hysteroscopy w/d&c N/A 09/12/2014    Procedure: DILATATION AND CURETTAGE /HYSTEROSCOPY;  Surgeon: Jonnie Kind, MD;  Location: [facility];  Service: Gynecology;  Laterality: N/A;  . Esophagogastroduodenoscopy (egd) with propofol N/A 04/10/2015    Procedure: ESOPHAGOGASTRODUODENOSCOPY (EGD) WITH PROPOFOL;  Surgeon: Daneil Dolin, MD;  Location: [facility];  Service: Endoscopy;  Laterality: N/A;  .  Esophageal dilation N/A 04/10/2015    Procedure: ESOPHAGEAL DILATION WITH 54FR MALONEY DILATOR;  Surgeon: Daneil Dolin, MD;  Location: [facility];  Service: Endoscopy;  Laterality: N/A;  . Esophageal biopsy  04/10/2015    Procedure: BIOPSY;  Surgeon: Daneil Dolin, MD;  Location: [facility];  Service: Endoscopy;;   Family History  Problem Relation Age of Onset  . Hemochromatosis Maternal Grandmother   . Migraines Maternal Grandmother   . Cancer Maternal Grandmother   . Breast cancer Maternal Grandmother   . Hypertension Father   . Diabetes Father   . Coronary artery disease Father   . Migraines Paternal Grandmother   . Breast cancer Paternal Grandmother   . Cancer  Mother     breast  . Hemochromatosis Mother   . Breast cancer Mother   . Depression Mother   . Anxiety disorder Mother   . Coronary artery disease Paternal Grandfather   . Anxiety disorder Brother   . Bipolar disorder Brother    History  Substance Use Topics  . Smoking status: Current Every Day Smoker -- 0.50 packs/day for 6 years    Types: Cigarettes  . Smokeless tobacco: Never Used     Comment: smokes 1 cigarette a day  . Alcohol Use: No   OB History    Gravida Para Term Preterm AB TAB SAB Ectopic Multiple Living   _0 Review of Systems  Constitutional: Positive for unexpected weight change. Negative for fever and chills.  Eyes: Negative for pain.  Respiratory: Negative for cough, choking and shortness of breath.   Cardiovascular: Negative for chest pain.  Gastrointestinal: Positive for nausea and vomiting. Negative for abdominal pain.  Endocrine: Positive for polyuria.  Genitourinary: Positive for dysuria, urgency and flank pain. Negative for decreased urine volume.  All other systems reviewed and are negative.     Allergies  Gabapentin; Metoclopramide hcl; Codeine; Zofran; Morphine and related; Latex; and Tape  Home Medications   Prior to Admission medications   Medication Sig Start Date End Date Taking? Authorizing Provider  ALPRAZolam Duanne Moron) 0.5 MG tablet Take 1 tablet (0.5 mg total) by mouth 3 (three) times daily as needed for sleep or anxiety. 06/25/15 06/24/16 Yes Cloria Spring, MD  carbamazepine (TEGRETOL) 200 MG tablet Take two tablets at bedtime Patient taking differently: Take 400 mg by mouth at bedtime.  06/25/15  Yes Cloria Spring, MD  DULoxetine (CYMBALTA) 60 MG capsule Take 1 capsule (60 mg total) by mouth daily. 06/25/15  Yes Cloria Spring, MD  oxyCODONE-acetaminophen (PERCOCET) 10-325 MG per tablet Take 1 tablet by mouth every 6 (six) hours as needed for pain. 06/26/15  Yes Rogene Houston, MD  pantoprazole (PROTONIX) 40 MG tablet  Take 1 tablet (40 mg total) by mouth 2 (two) times daily before a meal. 05/15/15  Yes Kerrie Buffalo, NP  topiramate (TOPAMAX) 100 MG tablet Take 1 tablet (100 mg total) by mouth at bedtime. 06/25/15  Yes Cloria Spring, MD  doxycycline (VIBRAMYCIN) 100 MG capsule Take 1 capsule (100 mg total) by mouth 2 (two) times daily. Patient not taking: Reported on 07/04/2015 06/20/15   Kathyrn Drown, MD  fluconazole (DIFLUCAN) 150 MG tablet Take 1 tablet (150 mg total) by mouth once. Patient not taking: Reported on 07/04/2015 05/23/15   Ripley Fraise, MD  fluconazole (DIFLUCAN) 150 MG tablet Take 1 tablet (150 mg total) by mouth once. Patient not taking: Reported  on 07/04/2015 06/20/15   Kathyrn Drown, MD  fluconazole (DIFLUCAN) 150 MG tablet Take 1 tablet (150 mg total) by mouth once. Take one tablet 3 days apart Patient not taking: Reported on 07/04/2015 07/03/15   Mikey Kirschner, MD  oxyCODONE-acetaminophen (PERCOCET/ROXICET) 5-325 MG per tablet Take 1 tablet by mouth every 6 (six) hours as needed for severe pain. Patient not taking: Reported on 07/04/2015 06/17/15   Rogene Houston, MD  phenazopyridine (PYRIDIUM) 200 MG tablet Take one tablet tid for 5 days prn Patient not taking: Reported on 07/04/2015 05/22/15   Mikey Kirschner, MD  promethazine (PHENERGAN) 25 MG tablet Take 1 tablet (25 mg total) by mouth every 6 (six) hours as needed for nausea or vomiting. 06/11/15   Ezequiel Essex, MD  sulfamethoxazole-trimethoprim (BACTRIM DS,SEPTRA DS) 800-160 MG per tablet Take 1 tablet by mouth 2 (two) times daily. Patient not taking: Reported on 07/04/2015 07/03/15   Mikey Kirschner, MD  traZODone (DESYREL) 150 MG tablet Take 1 tablet (150 mg total) by mouth at bedtime as needed for sleep. 06/25/15   Cloria Spring, MD   BP 134/76 mmHg  Pulse 85  Temp(Src) 97.9 F (36.6 C) (Oral)  Resp 18  Ht _0  (1.676 m)  Wt 225 lb (102.059 kg)  BMI 36.33 kg/m2  SpO2 98%  LMP 06/20/2015 Physical Exam  Constitutional: She  is oriented to person, place, and time. She appears well-developed and well-nourished.  HENT:  Head: Normocephalic and atraumatic.  Eyes: Conjunctivae and EOM are normal. Right eye exhibits no discharge. Left eye exhibits no discharge.  Cardiovascular: Normal rate and regular rhythm.   Pulmonary/Chest: Effort normal and breath sounds normal. No respiratory distress.  Abdominal: Soft. She exhibits no distension. There is no tenderness. There is no rebound.  Musculoskeletal: Normal range of motion. She exhibits no edema or tenderness.  Neurological: She is alert and oriented to person, place, and time.  Skin: Skin is warm and dry.  Nursing note and vitals reviewed.   ED Course  Procedures (including critical care time) Labs Review Labs Reviewed  URINALYSIS, ROUTINE W REFLEX MICROSCOPIC (NOT AT [facility]) - Abnormal; Notable for the following:    APPearance HAZY (*)    Glucose, UA 100 (*)    Bilirubin Urine SMALL (*)    Ketones, ur TRACE (*)    Protein, ur 30 (*)    Urobilinogen, UA 2.0 (*)    Nitrite POSITIVE (*)    Leukocytes, UA TRACE (*)    All other components within normal limits  URINE MICROSCOPIC-ADD ON - Abnormal; Notable for the following:    Squamous Epithelial / LPF MANY (*)    Bacteria, UA MANY (*)    All other components within normal limits  I-STAT CHEM 8, ED - Abnormal; Notable for the following:    Calcium, Ion 1.24 (*)    Hemoglobin 11.9 (*)    HCT 35.0 (*)    All other components within normal limits  URINE CULTURE    Imaging Review No results found.   EKG Interpretation None      MDM   Final diagnoses:  UTI (lower urinary tract infection)    History 29-year-old female with UTI yesterday claims that she has been vomiting for 4 months and has available to eat since that time but can tolerate fluids. Plain of bilateral lower back pain. Exam as above, well hydrated, no distress, no significant ttp. No e/o severe weight loss or malnutrition. Urinalysis done  showed  that she still urinary tract infection. Started on Bactrim yesterday to expect to start a workup already will continue Bactrim. Tolerate by mouth fine in the emergency department discussed these findings with her when I went back to discharge the patient she was no longer in her room.    Merrily Pew, MD 07/04/15 2126

## 2015-07-04 NOTE — ED Notes (Signed)
Went to give pt phenergan explained after the medication had time to work we would give her the bactrim. Pt upset that we are giving her PO medications. Pt states " I have all of this at home, my doctor sent me here to get IV medications" I explained to pt that the ED MD felt PO was sufficient and even if IV antibiotics were given should would need PO medications to go home. Pt still dissatisfied. Md notified.

## 2015-07-04 NOTE — ED Notes (Addendum)
Pt c/o loss of appetite for 4 months now. Pt reports loss of 20lbs over the last 4 months. Pt was placed on antibiotics yesterday for UTI by Dr. Mickie Hillier. Pt c/o severe bilateral lower back pain that radiates up the right side of back. Pt said she called Dr. Wolfgang Phoenix to see her this morning in the office and he instructed her to come to the ED for her symptoms. Pt c/o nausea, vomiting, diarrhea. No vomiting in the last 24 hours because she hasn't attempted any food per pt. Last solid food eaten 1 week ago. Pt has been able to keep protein shakes down for the past week.

## 2015-07-04 NOTE — Telephone Encounter (Signed)
Patient stated that she is still unable to keep anything down by mouth. Patient cannot take medication by mouth of any form because it will not stay down. Patient would also like to be admitted to hospital. Advised patient to report to the ED for evaluation because she may need IV therapy and we don't have privledges to admit patients to the hospital anymore.

## 2015-07-04 NOTE — ED Notes (Signed)
Pt not in room, pt did not notify staff prior to leaving. Pt was in NAD.

## 2015-07-07 LAB — URINE CULTURE

## 2015-07-08 ENCOUNTER — Ambulatory Visit (INDEPENDENT_AMBULATORY_CARE_PROVIDER_SITE_OTHER): Payer: Medicaid Other | Admitting: Family Medicine

## 2015-07-08 VITALS — Temp 98.0°F | Wt 214.0 lb

## 2015-07-08 DIAGNOSIS — R109 Unspecified abdominal pain: Secondary | ICD-10-CM | POA: Diagnosis not present

## 2015-07-08 DIAGNOSIS — N3000 Acute cystitis without hematuria: Secondary | ICD-10-CM | POA: Diagnosis not present

## 2015-07-08 MED ORDER — CEPHALEXIN 500 MG PO CAPS: ORAL_CAPSULE | ORAL | Status: DC

## 2015-07-08 MED ORDER — CEFTRIAXONE SODIUM 500 MG IJ SOLR
500.0000 mg | Freq: Once | INTRAMUSCULAR | Status: AC
  Administered 2015-07-08: 500 mg via INTRAMUSCULAR

## 2015-07-08 NOTE — Progress Notes (Signed)
   Subjective:    Patient ID: Penny [name], female    DOB: 1985/12/20, 29 y.o.   MRN: [id number]  HPI Patient has complex history she has history of intractable vomiting she is seen a specialist here in town they sent her to [facility] she is having an MRI of the liver area tomorrow patient states she's been struggling with urinary tract infection have difficult time keeping [place] down despite and again she states she is able to drink some and no blunt foods but still having difficult time keeping it down She is frustrated by what went on at the ER she felt it did not help her she wanted to get admitted if possible History a gastric bypass ER records from July including early July in late July were reviewed Review of Systems She relates vomiting she denies diarrhea she does relate some fever some flank pain some lower abdominal discomfort on the right side    Objective:   Physical Exam  Patient not in severe distress lungs are clear hearts regular abdomen soft subjected discomfort along the right side flank slight tenderness on the right side urinalysis does show some leukocytes      Assessment & Plan:  Rocephin 500, Keflex 500 4 times a day, Phenergan when necessary, encourage liquids, I recommended lab work patient deferred because of cost, do the MRI follow-up with specialist, if worse go to ER

## 2015-07-10 LAB — URINE CULTURE: Organism ID, Bacteria: NO GROWTH

## 2015-07-21 ENCOUNTER — Ambulatory Visit (INDEPENDENT_AMBULATORY_CARE_PROVIDER_SITE_OTHER): Payer: Medicaid Other | Admitting: Psychiatry

## 2015-07-21 ENCOUNTER — Telehealth: Payer: Self-pay | Admitting: Family Medicine

## 2015-07-21 ENCOUNTER — Encounter (HOSPITAL_COMMUNITY): Payer: Self-pay | Admitting: Psychiatry

## 2015-07-21 DIAGNOSIS — F329 Major depressive disorder, single episode, unspecified: Secondary | ICD-10-CM

## 2015-07-21 DIAGNOSIS — F32A Depression, unspecified: Secondary | ICD-10-CM

## 2015-07-21 MED ORDER — TIZANIDINE HCL 4 MG PO TABS
4.0000 mg | ORAL_TABLET | Freq: Three times a day (TID) | ORAL | Status: DC | PRN
Start: 2015-07-21 — End: 2015-11-17

## 2015-07-21 NOTE — Patient Instructions (Signed)
Discussed orally 
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
## 2015-07-21 NOTE — Telephone Encounter (Signed)
May refill tizanidine. May give 4 refills.Discontinue Flexeril.

## 2015-07-21 NOTE — Telephone Encounter (Signed)
Med sent to pharmacy. Patient was notified earlier that refill would be sent in.

## 2015-07-21 NOTE — Telephone Encounter (Signed)
Pt is wanting Dr. Nicki Reaper to refill her cyclobenzaprine 5 mg 1 tab every 8 hours and her tizanidine 4 mg three times daily as needed. They were originally prescribed by her neurologist but she is no longer under the neurologists care. Pt is also wanting to know if a script can be written for a tens unit.   [employer]

## 2015-07-21 NOTE — Telephone Encounter (Signed)
Patient states that she wants the tizanidine refilled. Script for tens unit at front desk.

## 2015-07-21 NOTE — Telephone Encounter (Signed)
I do not recommend both muscle relaxers together. That can cause significant side effects. It is best for the patient to choose one or the other. It is perfectly fine to go ahead and write [place] a order for a TENS unit

## 2015-08-01 ENCOUNTER — Ambulatory Visit (HOSPITAL_COMMUNITY): Payer: Self-pay | Admitting: Psychiatry

## 2015-08-01 ENCOUNTER — Other Ambulatory Visit: Payer: Self-pay | Admitting: *Deleted

## 2015-08-01 ENCOUNTER — Encounter: Payer: Self-pay | Admitting: Nurse Practitioner

## 2015-08-01 ENCOUNTER — Ambulatory Visit (INDEPENDENT_AMBULATORY_CARE_PROVIDER_SITE_OTHER): Payer: Medicaid Other | Admitting: Nurse Practitioner

## 2015-08-01 VITALS — BP 118/70 | Temp 97.8°F | Ht 66.0 in | Wt 216.8 lb

## 2015-08-01 DIAGNOSIS — M6248 Contracture of muscle, other site: Secondary | ICD-10-CM | POA: Diagnosis not present

## 2015-08-01 DIAGNOSIS — J300 Vasomotor rhinitis: Secondary | ICD-10-CM

## 2015-08-01 DIAGNOSIS — M62838 Other muscle spasm: Secondary | ICD-10-CM

## 2015-08-01 IMAGING — CR DG CHEST 2V
2 series · 2 of 2 positions shown · non-contrast
Comparison: 05/04/2013

CLINICAL DATA: Shortness of breath.

EXAM:
CHEST  2 VIEW

[view not recorded (1 of 2)]
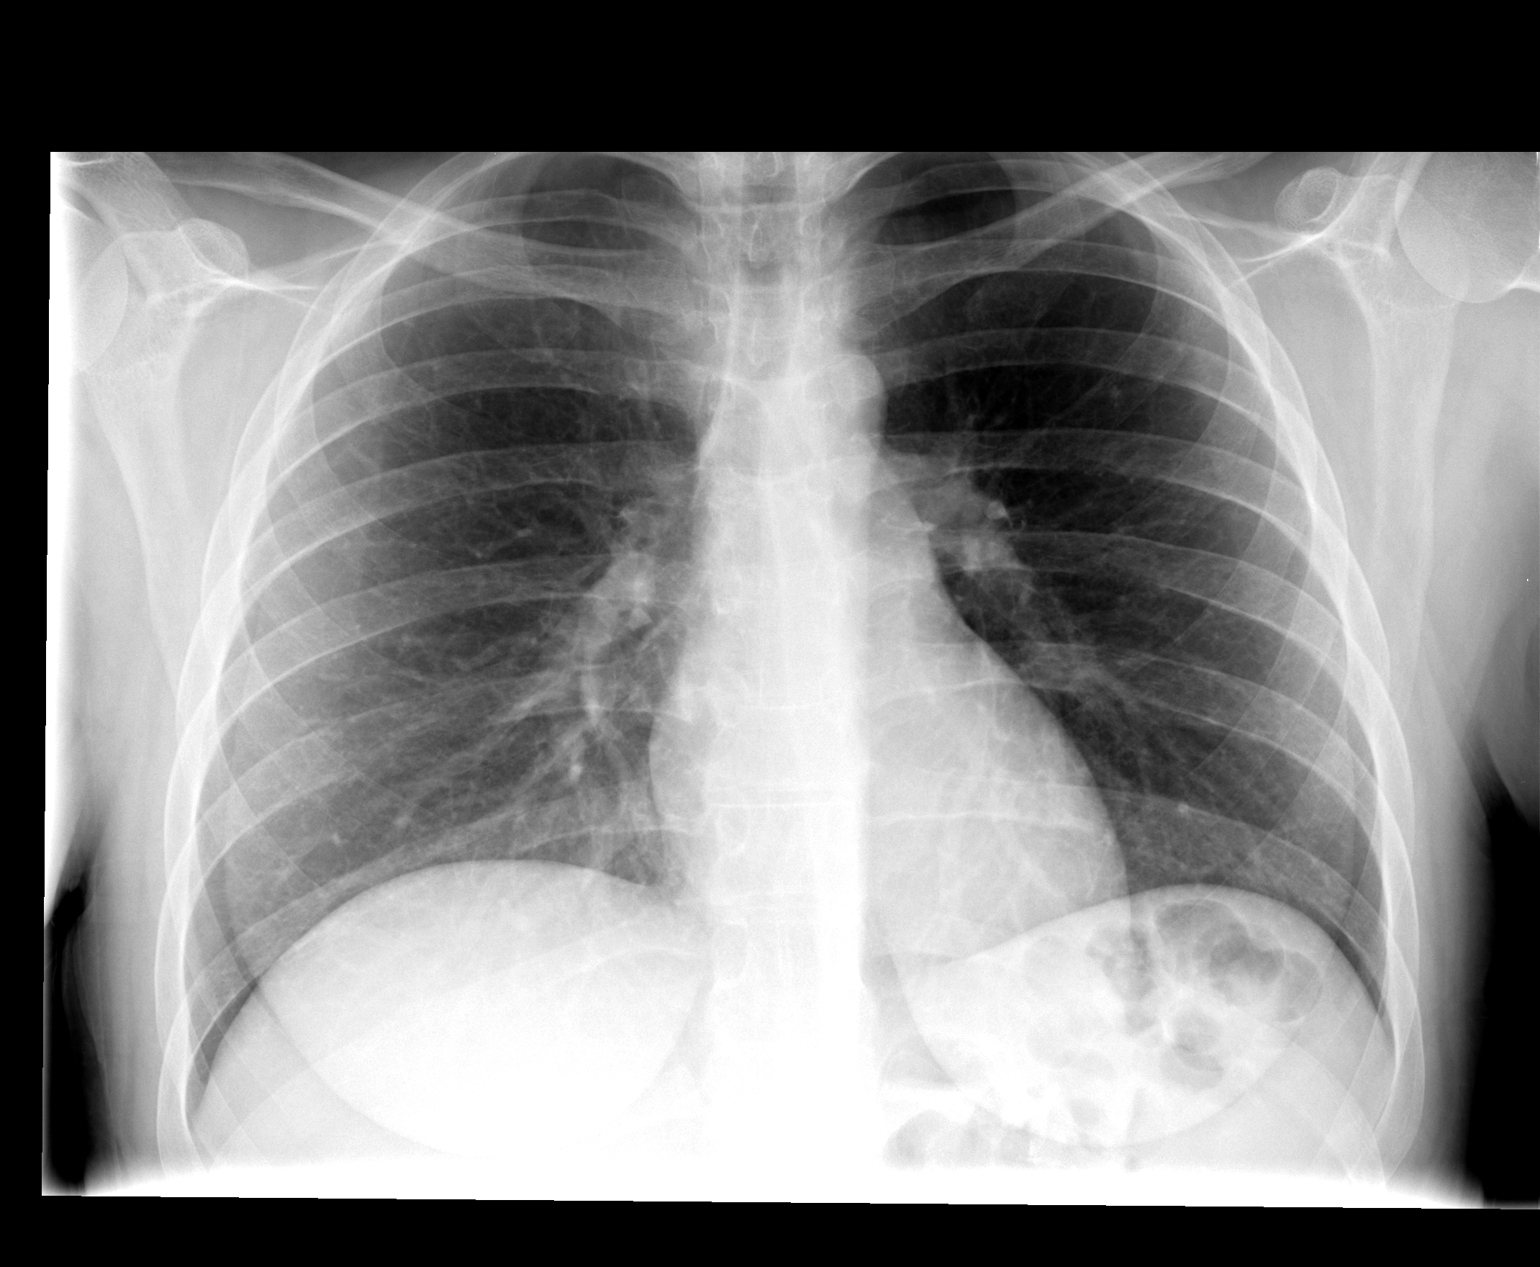

[view not recorded (2 of 2)]
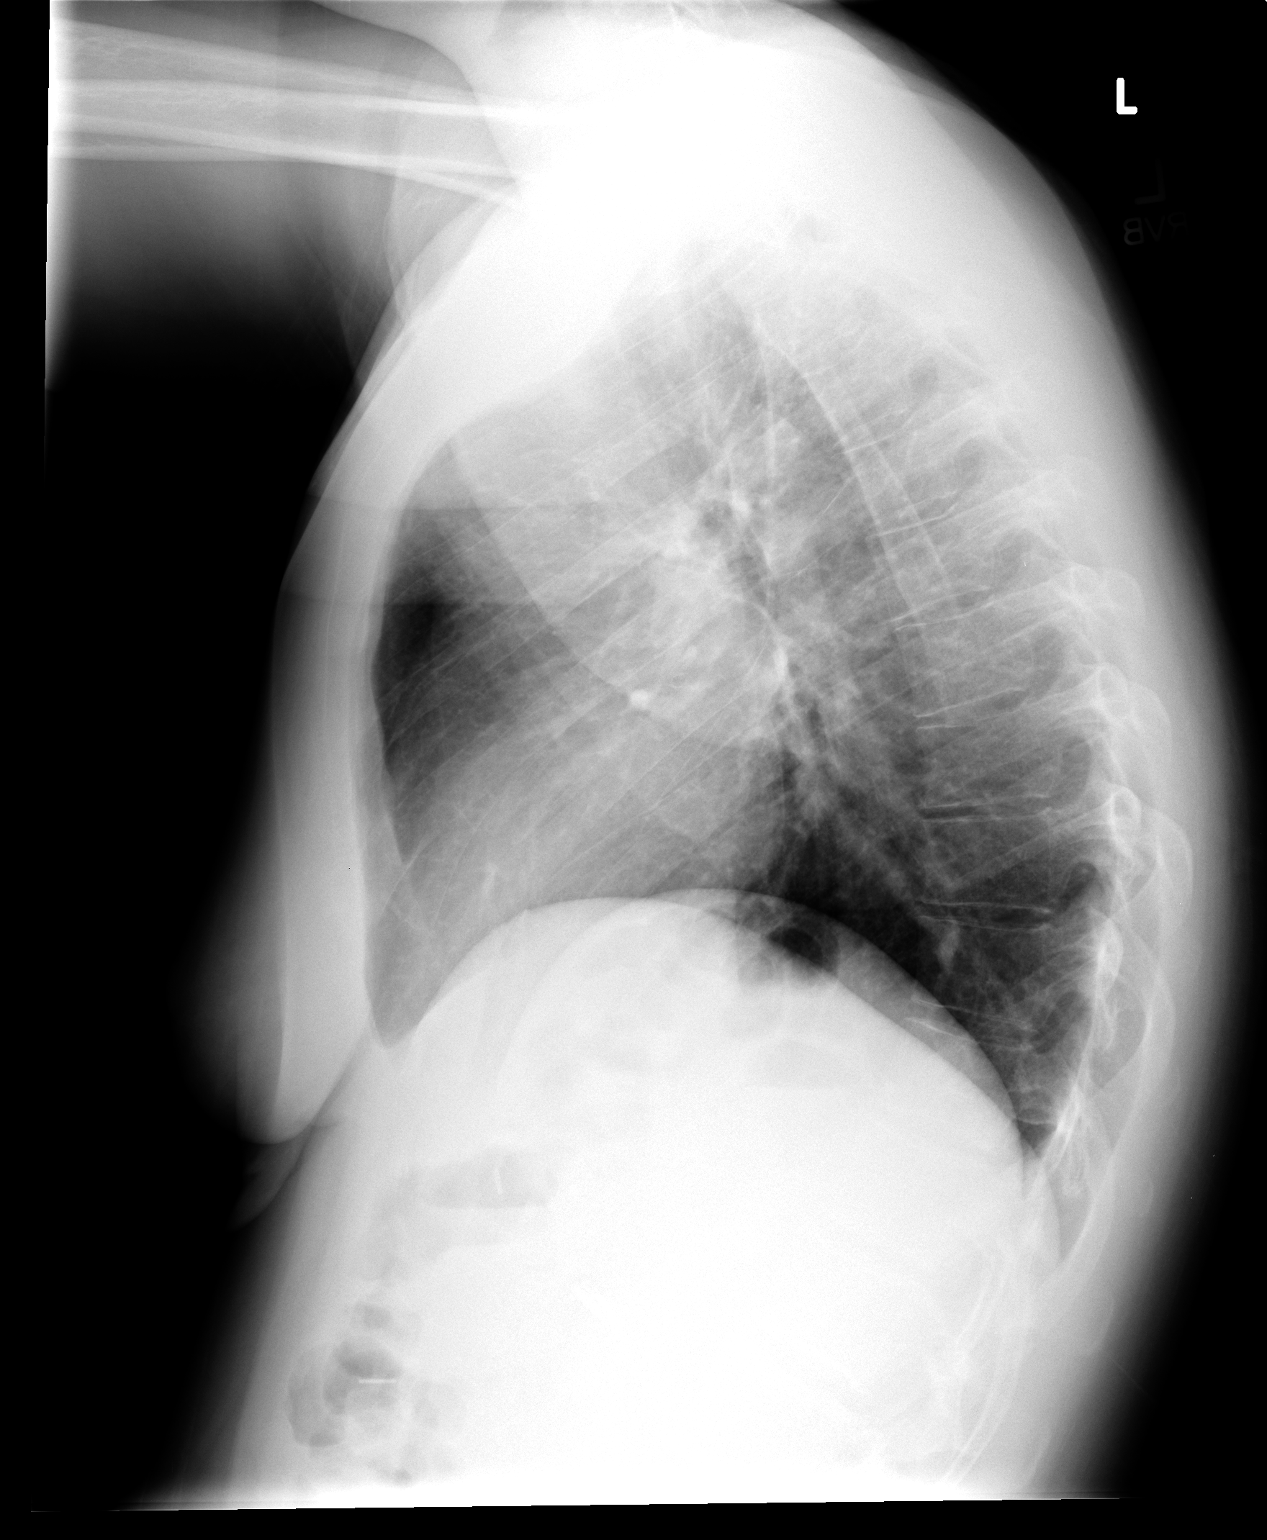

[2 of 2 positions shown; findings below may reference images not displayed]

FINDINGS: The heart size and mediastinal contours are within normal limits.
Both lungs are clear. The visualized skeletal structures are
unremarkable.
IMPRESSION: No active cardiopulmonary disease.

## 2015-08-01 MED ORDER — CYCLOBENZAPRINE HCL 10 MG PO TABS: 10.0000 mg | ORAL_TABLET | Freq: Three times a day (TID) | ORAL | Status: DC | PRN

## 2015-08-01 NOTE — Patient Instructions (Signed)
Loratadine 10 mg each day  Massage therapy (kneaded energy) Chiropractor

## 2015-08-03 ENCOUNTER — Encounter: Payer: Self-pay | Admitting: Nurse Practitioner

## 2015-08-03 NOTE — Progress Notes (Signed)
Subjective:  Presents for c/o bilat ear pain x 2 weeks. Has also had a flare up of her headaches. Was being seen by Dr. [name] her neurologist but states she was discharged because she "failed an appointment". No fever, sore throat or cough. Has a TENS unit. Has tried massage.   Objective:   BP 118/70 mmHg  Temp(Src) 97.8 F (36.6 C) (Oral)  Ht 5\' 6"  (1.676 m)  Wt 216 lb 12.8 oz (98.34 kg)  BMI 35.01 kg/m2  LMP 06/20/2015 NAD. Alert, oriented. TMs clear effusion, no erythema. Pharynx clear. Neck supple with mild anterior adenopathy. Lungs clear. Heart RRR. Tight tender muscles along upper back and neck area.   Assessment:  Muscle spasms of head and/or neck  Vasomotor rhinitis  Plan:  Meds ordered this encounter  Medications  . cyclobenzaprine (FLEXERIL) 10 MG tablet    Sig: Take 1 tablet (10 mg total) by mouth 3 (three) times daily as needed for muscle spasms. During the day; do not take with Zanaflex    Dispense:  30 tablet    Refill:  0    Order Specific Question:  Supervising Provider    Answer:  Mikey Kirschner [[id number]]   Requests something for pain so she "can get through her daughter's first birthday party tomorrow". States Zanaflex "knocks her out". Less drowsiness with Flexeril so Rx for during the day. Explained that opiates are not recommended as standard therapy for migraines. Restart TENS unit.  Given sample of OTC Rhinocort Aqua. Restart Loratadine.  Return if symptoms worsen or fail to improve.

## 2015-08-06 ENCOUNTER — Other Ambulatory Visit: Payer: Self-pay | Admitting: Nurse Practitioner

## 2015-08-06 ENCOUNTER — Ambulatory Visit (HOSPITAL_COMMUNITY): Payer: Self-pay | Admitting: Psychiatry

## 2015-08-06 ENCOUNTER — Telehealth: Payer: Self-pay | Admitting: Family Medicine

## 2015-08-06 MED ORDER — AZITHROMYCIN 250 MG PO TABS: ORAL_TABLET | ORAL | Status: DC

## 2015-08-06 NOTE — Telephone Encounter (Signed)
Patient notified

## 2015-08-06 NOTE — Telephone Encounter (Signed)
Pt called stating that she was seen last week by Hoyle Sauer and that she was told if she wasn't feeling better to call back. Pt is not feeling better. Please advise.

## 2015-08-06 NOTE — Telephone Encounter (Signed)
Antibiotic called in for ears. Call back if ear pain persists.

## 2015-08-10 IMAGING — CR DG CHEST 2V
2 series · 2 of 2 positions shown · non-contrast
Comparison: August 12, 2013

CLINICAL DATA: Chest pain and shortness of Breath

EXAM:
CHEST  2 VIEW

[w chest pa]
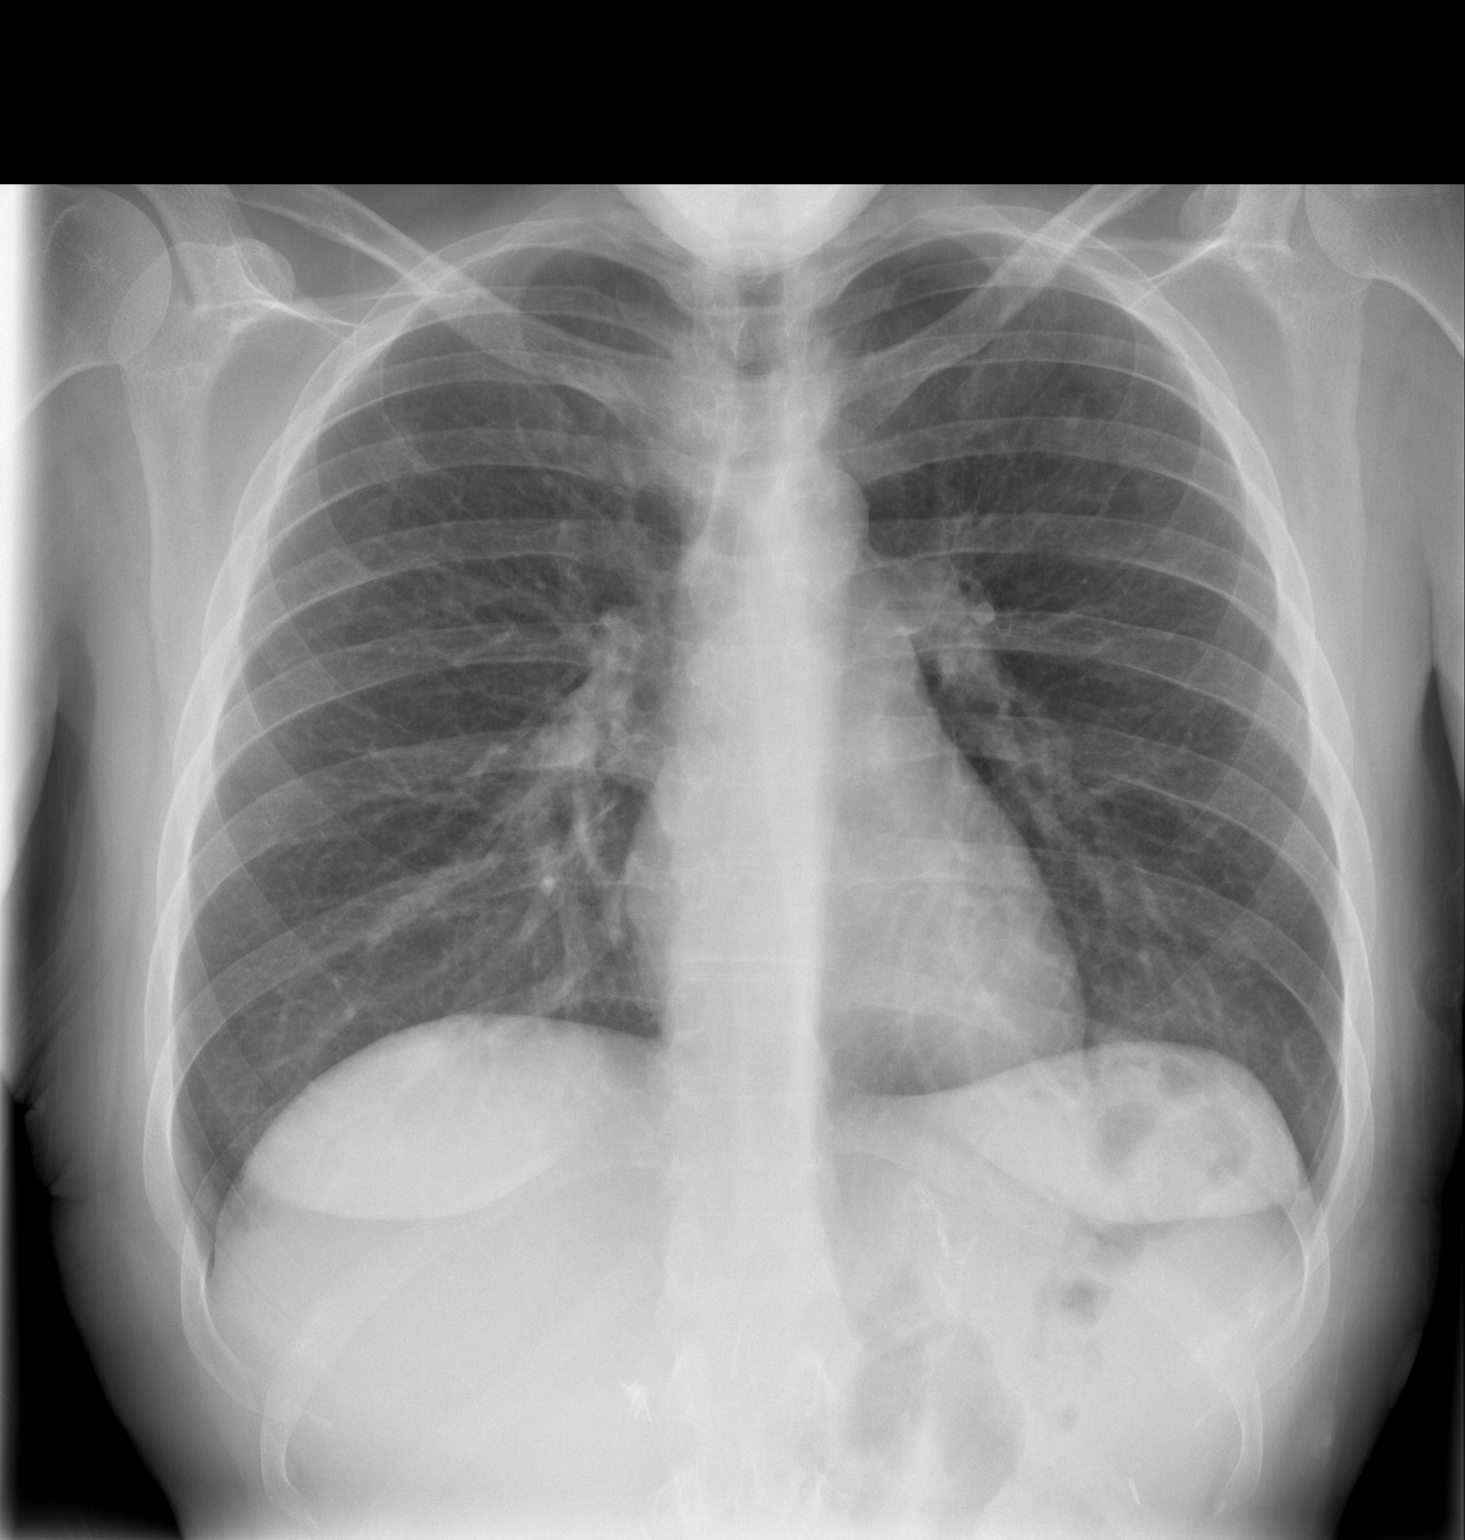

[w chest lat]
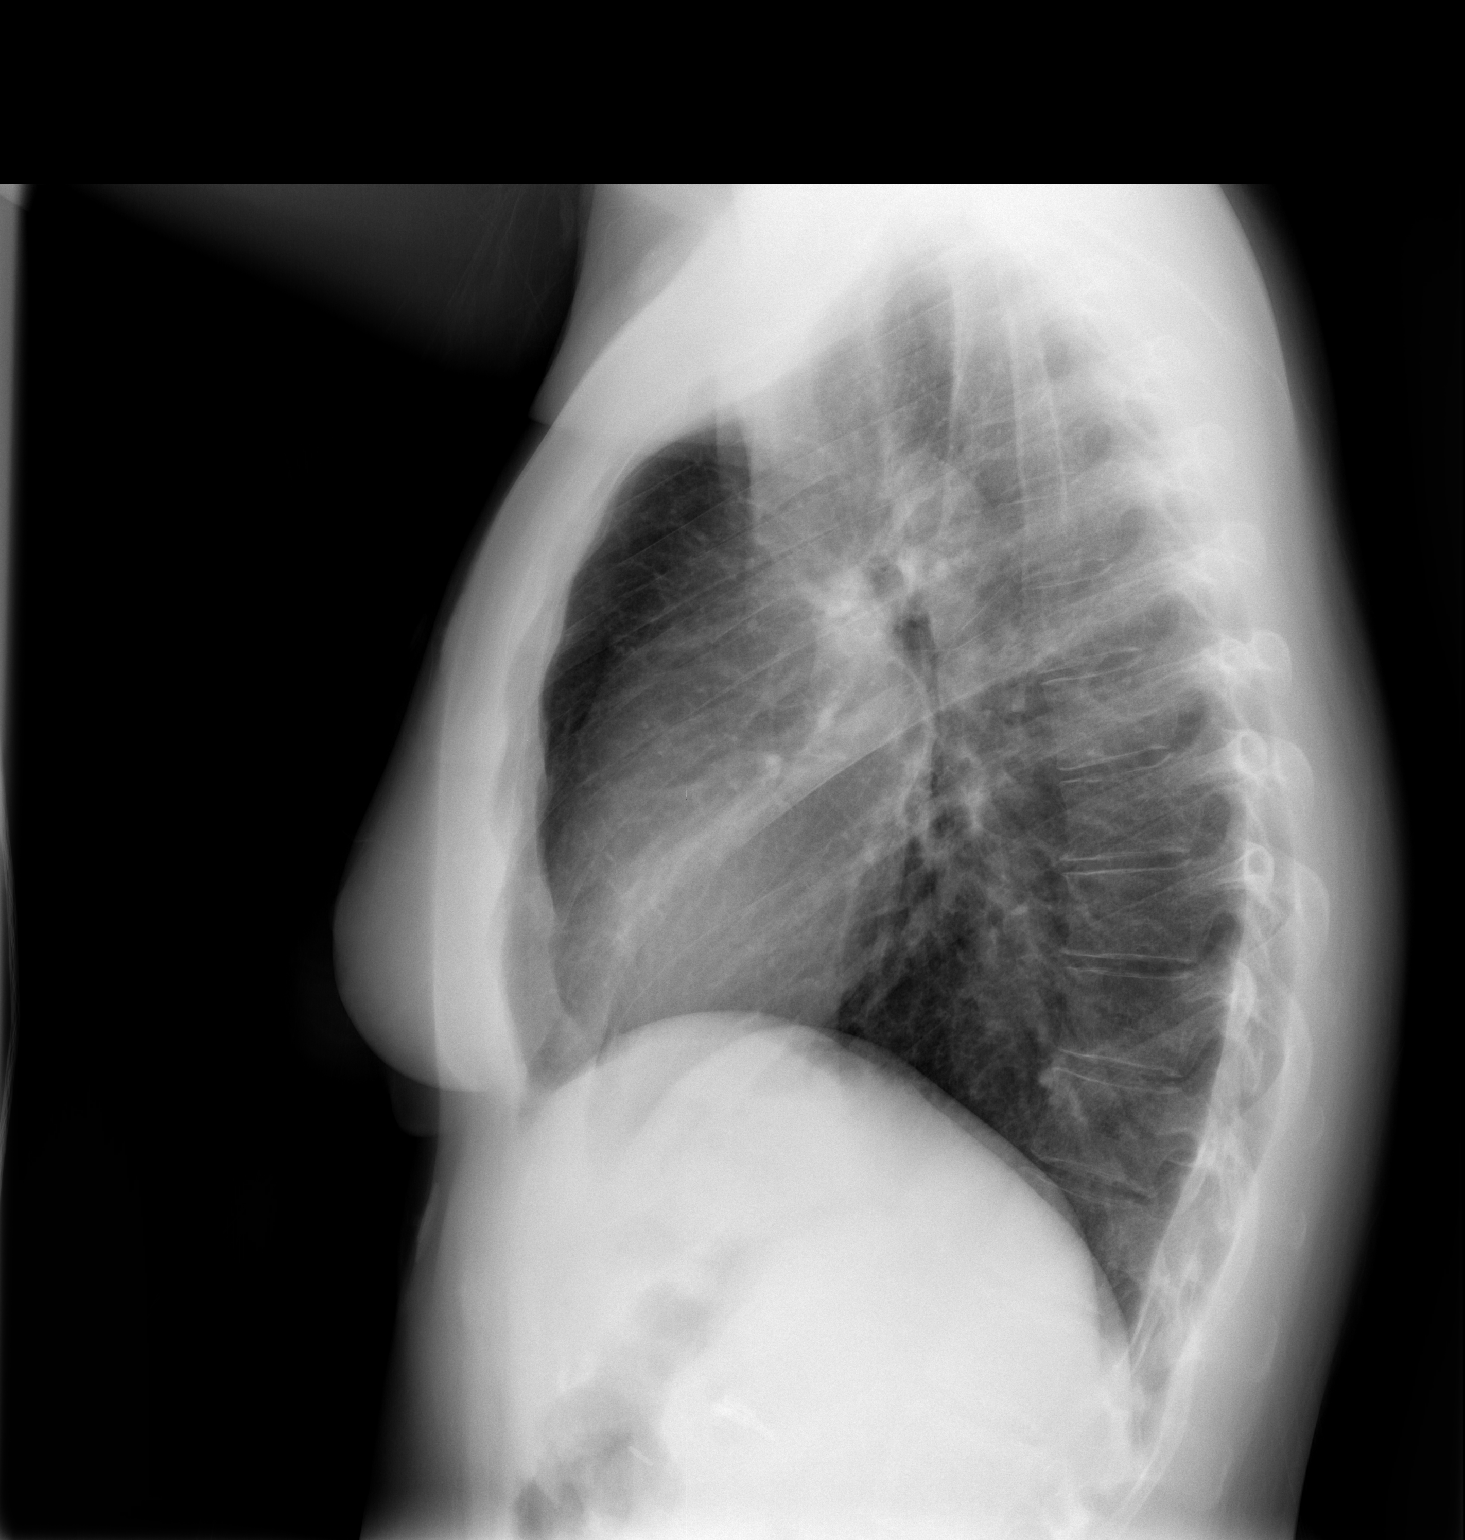

[2 of 2 positions shown; findings below may reference images not displayed]

FINDINGS: Lungs are clear. Heart size and pulmonary vascularity are normal. No
adenopathy. No bone lesions.
IMPRESSION: No abnormality noted.

## 2015-08-17 ENCOUNTER — Emergency Department (HOSPITAL_COMMUNITY)
Admission: EM | Admit: 2015-08-17 | Discharge: 2015-08-17 | Disposition: A | Discharge status: Home / Self Care | Payer: Medicaid Other | Attending: Emergency Medicine | Admitting: Emergency Medicine

## 2015-08-17 ENCOUNTER — Encounter (HOSPITAL_COMMUNITY): Payer: Self-pay | Admitting: Emergency Medicine

## 2015-08-17 DIAGNOSIS — Z8719 Personal history of other diseases of the digestive system: Secondary | ICD-10-CM | POA: Diagnosis not present

## 2015-08-17 DIAGNOSIS — M545 Low back pain: Secondary | ICD-10-CM | POA: Diagnosis not present

## 2015-08-17 DIAGNOSIS — F419 Anxiety disorder, unspecified: Secondary | ICD-10-CM | POA: Insufficient documentation

## 2015-08-17 DIAGNOSIS — E669 Obesity, unspecified: Secondary | ICD-10-CM | POA: Diagnosis not present

## 2015-08-17 DIAGNOSIS — R350 Frequency of micturition: Secondary | ICD-10-CM | POA: Diagnosis present

## 2015-08-17 DIAGNOSIS — Z8679 Personal history of other diseases of the circulatory system: Secondary | ICD-10-CM | POA: Diagnosis not present

## 2015-08-17 DIAGNOSIS — Z862 Personal history of diseases of the blood and blood-forming organs and certain disorders involving the immune mechanism: Secondary | ICD-10-CM | POA: Diagnosis not present

## 2015-08-17 DIAGNOSIS — G40909 Epilepsy, unspecified, not intractable, without status epilepticus: Secondary | ICD-10-CM | POA: Diagnosis not present

## 2015-08-17 DIAGNOSIS — R112 Nausea with vomiting, unspecified: Secondary | ICD-10-CM | POA: Insufficient documentation

## 2015-08-17 DIAGNOSIS — G8929 Other chronic pain: Secondary | ICD-10-CM | POA: Diagnosis not present

## 2015-08-17 DIAGNOSIS — Z8742 Personal history of other diseases of the female genital tract: Secondary | ICD-10-CM | POA: Insufficient documentation

## 2015-08-17 DIAGNOSIS — M797 Fibromyalgia: Secondary | ICD-10-CM | POA: Diagnosis not present

## 2015-08-17 DIAGNOSIS — F329 Major depressive disorder, single episode, unspecified: Secondary | ICD-10-CM | POA: Diagnosis not present

## 2015-08-17 DIAGNOSIS — Z720 Tobacco use: Secondary | ICD-10-CM | POA: Insufficient documentation

## 2015-08-17 DIAGNOSIS — N390 Urinary tract infection, site not specified: Secondary | ICD-10-CM

## 2015-08-17 DIAGNOSIS — Z87448 Personal history of other diseases of urinary system: Secondary | ICD-10-CM | POA: Diagnosis not present

## 2015-08-17 DIAGNOSIS — Z79899 Other long term (current) drug therapy: Secondary | ICD-10-CM | POA: Diagnosis not present

## 2015-08-17 DIAGNOSIS — R011 Cardiac murmur, unspecified: Secondary | ICD-10-CM | POA: Insufficient documentation

## 2015-08-17 DIAGNOSIS — G43909 Migraine, unspecified, not intractable, without status migrainosus: Secondary | ICD-10-CM | POA: Insufficient documentation

## 2015-08-17 DIAGNOSIS — Z91040 Latex allergy status: Secondary | ICD-10-CM | POA: Diagnosis not present

## 2015-08-17 LAB — URINALYSIS, ROUTINE W REFLEX MICROSCOPIC
Glucose, UA: 250 mg/dL — AB
Nitrite: POSITIVE — AB
Protein, ur: 100 mg/dL — AB
Specific Gravity, Urine: 1.02 (ref 1.005–1.030)
Urobilinogen, UA: 4 mg/dL — ABNORMAL HIGH (ref 0.0–1.0)
pH: 6.5 (ref 5.0–8.0)

## 2015-08-17 LAB — URINE MICROSCOPIC-ADD ON

## 2015-08-17 MED ORDER — HYDROCODONE-ACETAMINOPHEN 5-325 MG PO TABS: 2.0000 | ORAL_TABLET | ORAL | Status: DC | PRN

## 2015-08-17 MED ORDER — FLUCONAZOLE 150 MG PO TABS: 150.0000 mg | ORAL_TABLET | Freq: Every day | ORAL | Status: DC

## 2015-08-17 MED ORDER — CEPHALEXIN 500 MG PO CAPS: 500.0000 mg | ORAL_CAPSULE | Freq: Four times a day (QID) | ORAL | Status: DC

## 2015-08-17 NOTE — ED Provider Notes (Signed)
CSN: [id number]     Arrival date & time 08/17/15  1241 History  This chart was scribed for non-physician practitioner, Hollace Kinnier. Saunders Revel, working with Virgel Manifold, MD by Terressa Koyanagi, ED Scribe. This patient was seen in room APFT22/APFT22 and the patient's care was started at 1:57 PM.   Chief Complaint  Patient presents with  . Back Pain   The history is provided by the patient. No language interpreter was used.   PCP: [name], MD HPI Comments: Penny [name] is a 29 y.o. female, with PMHx noted below including interstitial cystitis (pt reports is generally controlled), directed to the Emergency Department by Urgent Care to rule out the possibility of kidney stones, complaining of worsening, radiating lower back pain radiating to lower abd with associated urinary frequency, dysuria, nausea and vomiting onset couple of weeks ago and worsening over the past couple of days. Pt states she has been taking AZO "like candy" without relief. Pt reports an allergy to codeine only. Pt reports tolerating Keflex well in the past, however, notes she gets yeast infections when she is on antibiotics.   Past Medical History  Diagnosis Date  . Migraines   . Interstitial cystitis   . IUD FEB 2010  . Gastritis JULY 2011  . Depression   . 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 Fatty liver   . Obesity (BMI 30-39.9) 2011 228 LBS BMI 36.8  . Iron deficiency anemia 07/23/2010  . Irritable bowel syndrome 2012 DIARRHEA    JUN 2012 TTG IgA 14.9  . Elevated liver enzymes JUL 2011ALK PHOS 111-127 AST  143-267 ALT  213-321T BILI 0.6  ALB  3.7-4.06 Jun 2011 ALK PHOS 118 AST 24 ALT 42 T BILI 0.4 ALB 3.9  . Chronic daily headache   . Ovarian cyst   . ADD (attention deficit disorder)   . RLQ abdominal pain 07/18/2013  . Menorrhagia 07/18/2013  . Anxiety   . Potassium (K) deficiency   . Dysmenorrhea 09/18/2013  . Stress 09/18/2013  . Patient desires pregnancy 09/18/2013  . Pregnant 12/25/2013  . Blood  transfusion without reported diagnosis   . Heart murmur   . Gastric bypass status for obesity   . 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 Dysrhythmia   . Sciatica of left side 11/14/2014  . Fibromyalgia   . 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 PONV (postoperative nausea and vomiting) seizure post-operatively  . Psychiatric pseudoseizure   . Depression    Past Surgical History  Procedure Laterality Date  . Gab  2007    in [place]-POUCH 5 CM  . Cholecystectomy  2005    biliary dyskinesia  . Cath self every nite      for sodium bicarb injection (discontinued 2013)  . Colonoscopy  JUN 2012 ABD PN/DIARRHEA WITH PROPOFOL    NL COLON  . Upper gastrointestinal endoscopy  JULY 2011 NAUSEA-D125,V6, PH 25    Bx; GASTRITIS, POUCH-5 CM LONG  . Dilation and curettage of uterus    . Tonsillectomy    . Gastric bypass  06/2006  . Savory dilation  06/20/2012    Dr. Barnie Alderman gastritis/Ulcer in the mid jejunum. Empiric dilation.   . Tonsillectomy and adenoidectomy    . Esophagogastroduodenoscopy    . Wisdom tooth extraction    . Repair vaginal cuff N/A 07/30/2014    Procedure: REPAIR VAGINAL CUFF;  Surgeon: Mora Bellman, MD;  Location: [facility];  Service: Gynecology;  Laterality: N/A;  . Hysteroscopy w/d&c N/A 09/12/2014    Procedure: DILATATION AND CURETTAGE /HYSTEROSCOPY;  Surgeon: Jonnie Kind, MD;  Location: [facility];  Service: Gynecology;  Laterality: N/A;  . Esophagogastroduodenoscopy (egd) with propofol N/A 04/10/2015    Procedure: ESOPHAGOGASTRODUODENOSCOPY (EGD) WITH PROPOFOL;  Surgeon: Daneil Dolin, MD;  Location: [facility];  Service: Endoscopy;  Laterality: N/A;  . Esophageal dilation N/A 04/10/2015    Procedure: ESOPHAGEAL DILATION WITH 54FR MALONEY DILATOR;  Surgeon: Daneil Dolin, MD;  Location: [facility];  Service: Endoscopy;  Laterality: N/A;  . Esophageal biopsy  04/10/2015    Procedure: BIOPSY;  Surgeon: Daneil Dolin, MD;  Location: [facility];  Service:  Endoscopy;;   Family History  Problem Relation Age of Onset  . Hemochromatosis Maternal Grandmother   . Migraines Maternal Grandmother   . Cancer Maternal Grandmother   . Breast cancer Maternal Grandmother   . Hypertension Father   . Diabetes Father   . Coronary artery disease Father   . Migraines Paternal Grandmother   . Breast cancer Paternal Grandmother   . Cancer Mother     breast  . Hemochromatosis Mother   . Breast cancer Mother   . Depression Mother   . Anxiety disorder Mother   . Coronary artery disease Paternal Grandfather   . Anxiety disorder Brother   . Bipolar disorder Brother    Social History  Substance Use Topics  . Smoking status: Current Some Day Smoker -- 0.25 packs/day for 6 years    Types: Cigarettes  . Smokeless tobacco: Never Used     Comment: smokes 1 cigarette a day  . Alcohol Use: 0.0 oz/week    0 Standard drinks or equivalent per week     Comment: once every weeks - maybe a beer with a dinner   OB History    Gravida Para Term Preterm AB TAB SAB Ectopic Multiple Living   '3 2 1 1 1  1   2     '$ Review of Systems  Constitutional: Negative for fever and chills.  Gastrointestinal: Positive for nausea, vomiting and abdominal pain.  Genitourinary: Positive for dysuria and frequency.  Musculoskeletal: Positive for back pain.  All other systems reviewed and are negative.     Allergies  Gabapentin; Metoclopramide hcl; Codeine; Zofran; Morphine and related; Latex; and Tape  Home Medications   Prior to Admission medications   Medication Sig Start Date End Date Taking? Authorizing Provider  ALPRAZolam Duanne Moron) 0.5 MG tablet Take 1 tablet (0.5 mg total) by mouth 3 (three) times daily as needed for sleep or anxiety. 06/25/15 06/24/16 Yes Cloria Spring, MD  carbamazepine (TEGRETOL) 200 MG tablet Take two tablets at bedtime Patient taking differently: Take 400 mg by mouth at bedtime.  06/25/15  Yes Cloria Spring, MD  cyclobenzaprine (FLEXERIL) 10 MG  tablet Take 1 tablet (10 mg total) by mouth 3 (three) times daily as needed for muscle spasms. During the day; do not take with Zanaflex 08/01/15  Yes Nilda Simmer, NP  DULoxetine (CYMBALTA) 60 MG capsule Take 1 capsule (60 mg total) by mouth daily. 06/25/15  Yes Cloria Spring, MD  pantoprazole (PROTONIX) 40 MG tablet Take 1 tablet (40 mg total) by mouth 2 (two) times daily before a meal. 05/15/15  Yes Kerrie Buffalo, NP  promethazine (PHENERGAN) 25 MG tablet Take 1 tablet (25 mg total) by mouth every 6 (six) hours as needed for nausea or vomiting. 06/11/15  Yes Annie Main  Rancour, MD  tiZANidine (ZANAFLEX) 4 MG tablet Take 1 tablet (4 mg total) by mouth 3 (three) times daily as needed for muscle spasms. 07/21/15  Yes Kathyrn Drown, MD  topiramate (TOPAMAX) 100 MG tablet Take 1 tablet (100 mg total) by mouth at bedtime. 06/25/15  Yes Cloria Spring, MD  traZODone (DESYREL) 150 MG tablet Take 1 tablet (150 mg total) by mouth at bedtime as needed for sleep. 06/25/15  Yes Cloria Spring, MD   Triage Vitals: BP 126/86 mmHg  Pulse 87  Temp(Src) 97.9 F (36.6 C) (Oral)  Resp 24  Ht $R'5\' 6"'DL$  (1.676 m)  Wt 216 lb (97.977 kg)  BMI 34.88 kg/m2  SpO2 100%  LMP 07/21/2015  Breastfeeding? No Physical Exam  Constitutional: She is oriented to person, place, and time. She appears well-developed and well-nourished.  HENT:  Head: Normocephalic.  Eyes: EOM are normal.  Neck: Normal range of motion.  Pulmonary/Chest: Effort normal.  Abdominal: She exhibits no distension. There is tenderness (diffusely tender). There is no rebound and no guarding.  Musculoskeletal: Normal range of motion.  Right CVA tenderness  Neurological: She is alert and oriented to person, place, and time.  Psychiatric: She has a normal mood and affect.  Nursing note and vitals reviewed.   ED Course  Procedures (including critical care time) DIAGNOSTIC STUDIES: Oxygen Saturation is 100% on RA, nl by my interpretation.    COORDINATION  OF CARE: 2:01 PM: Discussed treatment plan which includes antibiotics (Keflex), Diflucan, UA, labs, and with pt at bedside; patient verbalizes understanding and agrees with treatment plan.  Labs Review Labs Reviewed  URINALYSIS, ROUTINE W REFLEX MICROSCOPIC (NOT AT [facility]) - Abnormal; Notable for the following:    Color, Urine ORANGE (*)    APPearance CLOUDY (*)    Glucose, UA 250 (*)    Hgb urine dipstick TRACE (*)    Bilirubin Urine SMALL (*)    Ketones, ur TRACE (*)    Protein, ur 100 (*)    Urobilinogen, UA 4.0 (*)    Nitrite POSITIVE (*)    Leukocytes, UA MODERATE (*)    All other components within normal limits  URINE MICROSCOPIC-ADD ON - Abnormal; Notable for the following:    Squamous Epithelial / LPF FEW (*)    Bacteria, UA MANY (*)    All other components within normal limits  URINE CULTURE   I have personally reviewed and evaluated these images and lab results as part of my medical decision-making.    MDM   Final diagnoses:  UTI (lower urinary tract infection)    Keflex Diflucan Hydrocodone avs  I personally performed the services in this documentation, which was scribed in my presence.  The recorded information has been reviewed and considered.   Ronnald Collum.  Hollace Kinnier [place], PA-C 08/17/15 1626  Virgel Manifold, MD 08/28/15 [phone]

## 2015-08-17 NOTE — Discharge Instructions (Signed)

## 2015-08-17 NOTE — ED Notes (Addendum)
Pt reports urinary frequency and dysuria. Pt reports sent from urgent care for rule out kidney stone. Pt reports lower back pain,pelvic pain, vomiting. nad noted. Pt reports taking AZO with no relief.

## 2015-08-19 LAB — URINE CULTURE: Special Requests: NORMAL

## 2015-08-21 ENCOUNTER — Telehealth: Payer: Self-pay | Admitting: Family Medicine

## 2015-08-21 MED ORDER — PHENAZOPYRIDINE HCL 200 MG PO TABS: 200.0000 mg | ORAL_TABLET | Freq: Three times a day (TID) | ORAL | Status: DC

## 2015-08-21 MED ORDER — NITROFURANTOIN MONOHYD MACRO 100 MG PO CAPS: 100.0000 mg | ORAL_CAPSULE | Freq: Two times a day (BID) | ORAL | Status: DC

## 2015-08-21 NOTE — Telephone Encounter (Signed)
May be resistant i looked at culture e cole resistant to some meds, keflex not ck'ed  macrobid 100 bid seven d  pyrid 200 tid seven d  i do not prescribe narcotics for uti's

## 2015-08-21 NOTE — Telephone Encounter (Signed)
Pt was seen in the er for a uti last Sunday and states that the pain meds and antibiotics are not working. Pt is wanting to know if something can be called in for it or if she needs to be seen.   [employer]

## 2015-08-21 NOTE — Telephone Encounter (Signed)
Notified patient May be resistant Dr. Richardson Landry looked at culture e cole resistant to some meds, keflex not checked  macrobid 100 bid seven days   pyrid 200 tid seven d  Dr. Richardson Landry does not prescribe narcotics for uti's. Patient verbalized understanding.

## 2015-08-25 ENCOUNTER — Other Ambulatory Visit (HOSPITAL_COMMUNITY): Payer: Self-pay | Admitting: Psychiatry

## 2015-09-09 ENCOUNTER — Ambulatory Visit (INDEPENDENT_AMBULATORY_CARE_PROVIDER_SITE_OTHER): Payer: Medicaid Other | Admitting: Psychiatry

## 2015-09-09 ENCOUNTER — Encounter (HOSPITAL_COMMUNITY): Payer: Self-pay | Admitting: Psychiatry

## 2015-09-09 DIAGNOSIS — F329 Major depressive disorder, single episode, unspecified: Secondary | ICD-10-CM

## 2015-09-09 DIAGNOSIS — F32A Depression, unspecified: Secondary | ICD-10-CM
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
## 2015-09-09 NOTE — Patient Instructions (Signed)
Discussed orally 

## 2015-09-12 ENCOUNTER — Telehealth: Payer: Self-pay | Admitting: Family Medicine

## 2015-09-12 MED ORDER — CYCLOBENZAPRINE HCL 10 MG PO TABS: 10.0000 mg | ORAL_TABLET | Freq: Three times a day (TID) | ORAL | Status: DC | PRN

## 2015-09-12 NOTE — Telephone Encounter (Signed)
Called patient and informed her per Dr.Scott Luking-Refill flexeril, no great ideas for pain. Massage and tylenol. Patient verbalized understanding.

## 2015-09-12 NOTE — Telephone Encounter (Signed)
Spoke with patient and patient would like a refill on Flexeril and stated that she is still having neck, and shoulder pain. She stated that the Flexeril helps some and she is taking OTC Tylenol and wants to know if there is something else you recommend for the pain.

## 2015-09-12 NOTE — Telephone Encounter (Signed)
Refill flexeril, no great ideas for pain. Massage and tylenol

## 2015-09-12 NOTE — Telephone Encounter (Signed)
cyclobenzaprine (FLEXERIL) 10 MG tablet   Pt would like a refill on this med an also wants to talk  To a nurse but would not tell me why

## 2015-09-17 ENCOUNTER — Encounter (HOSPITAL_COMMUNITY): Payer: Self-pay | Admitting: Psychiatry

## 2015-09-17 ENCOUNTER — Ambulatory Visit (INDEPENDENT_AMBULATORY_CARE_PROVIDER_SITE_OTHER): Payer: Medicaid Other | Admitting: Psychiatry

## 2015-09-17 VITALS — BP 115/76 | HR 92 | Ht 66.0 in | Wt 206.8 lb

## 2015-09-17 DIAGNOSIS — F332 Major depressive disorder, recurrent severe without psychotic features: Secondary | ICD-10-CM | POA: Diagnosis not present

## 2015-09-17 MED ORDER — CARBAMAZEPINE 200 MG PO TABS: ORAL_TABLET | ORAL | Status: DC

## 2015-09-17 MED ORDER — DULOXETINE HCL 60 MG PO CPEP: 60.0000 mg | ORAL_CAPSULE | Freq: Every day | ORAL | Status: DC

## 2015-09-17 MED ORDER — AMPHETAMINE-DEXTROAMPHET ER 30 MG PO CP24: 30.0000 mg | ORAL_CAPSULE | Freq: Every day | ORAL | Status: DC

## 2015-09-17 MED ORDER — CLONAZEPAM 0.5 MG PO TABS: 0.5000 mg | ORAL_TABLET | Freq: Three times a day (TID) | ORAL | Status: DC | PRN

## 2015-09-17 MED ORDER — TRAZODONE HCL 150 MG PO TABS: 150.0000 mg | ORAL_TABLET | Freq: Every evening | ORAL | Status: DC | PRN
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
## 2015-09-18 ENCOUNTER — Telehealth: Payer: Self-pay | Admitting: Family Medicine

## 2015-09-18 MED ORDER — FLUCONAZOLE 150 MG PO TABS: 150.0000 mg | ORAL_TABLET | Freq: Every day | ORAL | Status: DC

## 2015-09-18 MED ORDER — PHENAZOPYRIDINE HCL 200 MG PO TABS: 200.0000 mg | ORAL_TABLET | Freq: Three times a day (TID) | ORAL | Status: DC

## 2015-09-18 NOTE — Telephone Encounter (Signed)
Patient states that she also has a yeast infection. Pyridium and Diflucan was sent to the pharmacy.

## 2015-09-18 NOTE — Telephone Encounter (Signed)
Pt is needing a refill on the pyridium. Pt states that she takes it when she has flare ups.  [employer]

## 2015-09-18 NOTE — Telephone Encounter (Signed)
May have 3 refills 

## 2015-09-23 ENCOUNTER — Telehealth: Payer: Self-pay | Admitting: Family Medicine

## 2015-09-23 NOTE — Telephone Encounter (Signed)
Nurse's-This patient has complex mental health issues. This is not something that I can do ongoing management 4. If the patient is not going to be seeing Dr. Harrington Challenger ongoing she may need to go to [facility] for ongoing mental health care. I do not recommend [name] prescribing her medications ongoing. If patient needs one month of medications in order to get established with [facility] we can provide one month of medications. It should be noted that there is no guarantee other mental health facilities will continue current medication. Please discuss what is going on with the patient find out what the situation is. May need referral to [facility]

## 2015-09-23 NOTE — Telephone Encounter (Signed)
Requested medications were last filled by Dr.Ross.

## 2015-09-23 NOTE — Telephone Encounter (Signed)
Pt states that Naranja is supposed to have sent [name] a script For refill on her topamax an her xanax (I don't see that in her list)   Please advise

## 2015-09-24 ENCOUNTER — Ambulatory Visit (HOSPITAL_COMMUNITY): Payer: Self-pay | Admitting: Psychiatry

## 2015-09-24 NOTE — Telephone Encounter (Signed)
Do topamax 50 one in am 2 in pm, #90, 4 rf, f/u ov within 3 months sooner if not better

## 2015-09-24 NOTE — Telephone Encounter (Signed)
[facility] 09/24/15

## 2015-09-24 NOTE — Telephone Encounter (Signed)
Patient states that Dr Harrington Challenger will not fill topamax anymore since it is for her migraines- she doesn't see neurologist anymore and wants [name] to refill topamax-she has been out for 4 days. She is also interested in increasing the dose because she having a lot of migraines

## 2015-09-25 MED ORDER — TOPIRAMATE 50 MG PO TABS: ORAL_TABLET | ORAL | Status: DC

## 2015-09-25 NOTE — Telephone Encounter (Signed)
[facility] 09/25/15

## 2015-09-26 NOTE — Telephone Encounter (Signed)
Patient notified that med was sent to pharmacy and needs follow up with in 3 months. Patient verbalized understanding.

## 2015-10-05 ENCOUNTER — Encounter (HOSPITAL_COMMUNITY): Payer: Self-pay | Admitting: Emergency Medicine

## 2015-10-05 ENCOUNTER — Emergency Department (HOSPITAL_COMMUNITY)
Admission: EM | Admit: 2015-10-05 | Discharge: 2015-10-05 | Disposition: A | Discharge status: Home / Self Care | Payer: Medicaid Other | Attending: Emergency Medicine | Admitting: Emergency Medicine

## 2015-10-05 ENCOUNTER — Emergency Department (HOSPITAL_COMMUNITY): Payer: Medicaid Other

## 2015-10-05 DIAGNOSIS — E669 Obesity, unspecified: Secondary | ICD-10-CM | POA: Diagnosis not present

## 2015-10-05 DIAGNOSIS — Z79899 Other long term (current) drug therapy: Secondary | ICD-10-CM | POA: Insufficient documentation

## 2015-10-05 DIAGNOSIS — Z87891 Personal history of nicotine dependence: Secondary | ICD-10-CM | POA: Insufficient documentation

## 2015-10-05 DIAGNOSIS — Z8742 Personal history of other diseases of the female genital tract: Secondary | ICD-10-CM | POA: Insufficient documentation

## 2015-10-05 DIAGNOSIS — Z87448 Personal history of other diseases of urinary system: Secondary | ICD-10-CM | POA: Diagnosis not present

## 2015-10-05 DIAGNOSIS — M5432 Sciatica, left side: Secondary | ICD-10-CM | POA: Diagnosis not present

## 2015-10-05 DIAGNOSIS — G8929 Other chronic pain: Secondary | ICD-10-CM | POA: Diagnosis not present

## 2015-10-05 DIAGNOSIS — Z91040 Latex allergy status: Secondary | ICD-10-CM | POA: Insufficient documentation

## 2015-10-05 DIAGNOSIS — Z8781 Personal history of (healed) traumatic fracture: Secondary | ICD-10-CM | POA: Diagnosis not present

## 2015-10-05 DIAGNOSIS — K589 Irritable bowel syndrome without diarrhea: Secondary | ICD-10-CM | POA: Diagnosis not present

## 2015-10-05 DIAGNOSIS — G43909 Migraine, unspecified, not intractable, without status migrainosus: Secondary | ICD-10-CM | POA: Diagnosis not present

## 2015-10-05 DIAGNOSIS — F909 Attention-deficit hyperactivity disorder, unspecified type: Secondary | ICD-10-CM | POA: Insufficient documentation

## 2015-10-05 DIAGNOSIS — Y9289 Other specified places as the place of occurrence of the external cause: Secondary | ICD-10-CM | POA: Diagnosis not present

## 2015-10-05 DIAGNOSIS — Y9389 Activity, other specified: Secondary | ICD-10-CM | POA: Diagnosis not present

## 2015-10-05 DIAGNOSIS — Y998 Other external cause status: Secondary | ICD-10-CM | POA: Insufficient documentation

## 2015-10-05 DIAGNOSIS — M797 Fibromyalgia: Secondary | ICD-10-CM | POA: Diagnosis not present

## 2015-10-05 DIAGNOSIS — W1839XA Other fall on same level, initial encounter: Secondary | ICD-10-CM | POA: Insufficient documentation

## 2015-10-05 DIAGNOSIS — F419 Anxiety disorder, unspecified: Secondary | ICD-10-CM | POA: Diagnosis not present

## 2015-10-05 DIAGNOSIS — S99912A Unspecified injury of left ankle, initial encounter: Secondary | ICD-10-CM | POA: Diagnosis present

## 2015-10-05 DIAGNOSIS — S93402A Sprain of unspecified ligament of left ankle, initial encounter: Secondary | ICD-10-CM | POA: Diagnosis not present

## 2015-10-05 DIAGNOSIS — R011 Cardiac murmur, unspecified: Secondary | ICD-10-CM | POA: Insufficient documentation

## 2015-10-05 DIAGNOSIS — Z862 Personal history of diseases of the blood and blood-forming organs and certain disorders involving the immune mechanism: Secondary | ICD-10-CM | POA: Insufficient documentation

## 2015-10-05 DIAGNOSIS — F329 Major depressive disorder, single episode, unspecified: Secondary | ICD-10-CM | POA: Diagnosis not present

## 2015-10-05 MED ORDER — IBUPROFEN 400 MG PO TABS
600.0000 mg | ORAL_TABLET | Freq: Once | ORAL | Status: AC
  Administered 2015-10-05: 600 mg via ORAL
  Filled 2015-10-05: qty 2

## 2015-10-05 MED ORDER — IBUPROFEN 800 MG PO TABS: 800.0000 mg | ORAL_TABLET | Freq: Three times a day (TID) | ORAL | Status: DC

## 2015-10-05 NOTE — Discharge Instructions (Signed)
Ankle Sprain °An ankle sprain is an injury to the strong, fibrous tissues (ligaments) that hold the bones of your ankle joint together.  °CAUSES °An ankle sprain is usually caused by a fall or by twisting your ankle. Ankle sprains most commonly occur when you step on the outer edge of your foot, and your ankle turns inward. People who participate in sports are more prone to these types of injuries.  °SYMPTOMS  °· Pain in your ankle. The pain may be present at rest or only when you are trying to stand or walk. °· Swelling. °· Bruising. Bruising may develop immediately or within 1 to 2 days after your injury. °· Difficulty standing or walking, particularly when turning corners or changing directions. °DIAGNOSIS  °Your caregiver will ask you details about your injury and perform a physical exam of your ankle to determine if you have an ankle sprain. During the physical exam, your caregiver will press on and apply pressure to specific areas of your foot and ankle. Your caregiver will try to move your ankle in certain ways. An X-ray exam may be done to be sure a bone was not broken or a ligament did not separate from one of the bones in your ankle (avulsion fracture).  °TREATMENT  °Certain types of braces can help stabilize your ankle. Your caregiver can make a recommendation for this. Your caregiver may recommend the use of medicine for pain. If your sprain is severe, your caregiver may refer you to a surgeon who helps to restore function to parts of your skeletal system (orthopedist) or a physical therapist. °HOME CARE INSTRUCTIONS  °· Apply ice to your injury for 1-2 days or as directed by your caregiver. Applying ice helps to reduce inflammation and pain. °¨ Put ice in a plastic bag. °¨ Place a towel between your skin and the bag. °¨ Leave the ice on for 15-20 minutes at a time, every 2 hours while you are awake. °· Only take over-the-counter or prescription medicines for pain, discomfort, or fever as directed by  your caregiver. °· Elevate your injured ankle above the level of your heart as much as possible for 2-3 days. °· If your caregiver recommends crutches, use them as instructed. Gradually put weight on the affected ankle. Continue to use crutches or a cane until you can walk without feeling pain in your ankle. °· If you have a plaster splint, wear the splint as directed by your caregiver. Do not rest it on anything harder than a pillow for the first 24 hours. Do not put weight on it. Do not get it wet. You may take it off to take a shower or bath. °· You may have been given an elastic bandage to wear around your ankle to provide support. If the elastic bandage is too tight (you have numbness or tingling in your foot or your foot becomes cold and blue), adjust the bandage to make it comfortable. °· If you have an air splint, you may blow more air into it or let air out to make it more comfortable. You may take your splint off at night and before taking a shower or bath. Wiggle your toes in the splint several times per day to decrease swelling. °SEEK MEDICAL CARE IF:  °· You have rapidly increasing bruising or swelling. °· Your toes feel extremely cold or you lose feeling in your foot. °· Your pain is not relieved with medicine. °SEEK IMMEDIATE MEDICAL CARE IF: °· Your toes are numb or blue. °·   You have severe pain that is increasing. MAKE SURE YOU:   Understand these instructions.  Will watch your condition.  Will get help right away if you are not doing well or get worse.   This information is not intended to replace advice given to you by your health care provider. Make sure you discuss any questions you have with your health care provider.   Document Released: 11/22/2005 Document Revised: 12/13/2014 Document Reviewed: 12/04/2011 Elsevier Interactive Patient Education 2016 [place] the ASO and use crutches to avoid weight bearing.  Use ice and elevation as much as possible for the next  several days to help reduce the swelling.  Take the medications prescribed.  Call your doctor for a recheck of your injury in 1 week.  You may benefit from physical therapy of your ankle if it is not getting better over the next week.

## 2015-10-05 NOTE — ED Provider Notes (Signed)
CSN: [id number]     Arrival date & time 10/05/15  1307 History  By signing my name below, I, Irene Pap, attest that this documentation has been prepared under the direction and in the presence of Evalee Jefferson, PA-C. Electronically Signed: Irene Pap, ED Scribe. 10/05/2015. 4:26 PM.   Chief Complaint  Patient presents with  . Ankle Pain   The history is provided by the patient. No language interpreter was used.  HPI Comments: Penny [name] is a 29 y.o. Female with a hx of fibromyalgia who presents to the Emergency Department complaining of gradually worsening medial left ankle pain that radiates up to her left knee onset two days ago. Pt states that she rolled her ankle and fell. She states that she is now having worsening pain with ambulation and dorsiflexion. Pt reports using RICE techniques, tylenol and ibuprofen to no relief. Pt reports past hx of ankle sprain and hairline fracture.   Past Medical History  Diagnosis Date  . Migraines   . Interstitial cystitis   . IUD FEB 2010  . Gastritis JULY 2011  . Depression   . 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 Fatty liver   . Obesity (BMI 30-39.9) 2011 228 LBS BMI 36.8  . Iron deficiency anemia 07/23/2010  . Irritable bowel syndrome 2012 DIARRHEA    JUN 2012 TTG IgA 14.9  . Elevated liver enzymes JUL 2011ALK PHOS 111-127 AST  143-267 ALT  213-321T BILI 0.6  ALB  3.7-4.06 Jun 2011 ALK PHOS 118 AST 24 ALT 42 T BILI 0.4 ALB 3.9  . Chronic daily headache   . Ovarian cyst   . ADD (attention deficit disorder)   . RLQ abdominal pain 07/18/2013  . Menorrhagia 07/18/2013  . Anxiety   . Potassium (K) deficiency   . Dysmenorrhea 09/18/2013  . Stress 09/18/2013  . Patient desires pregnancy 09/18/2013  . Pregnant 12/25/2013  . Blood transfusion without reported diagnosis   . Heart murmur   . Gastric bypass status for obesity   . 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 Dysrhythmia   . Sciatica of left side 11/14/2014  .  Fibromyalgia   . 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 PONV (postoperative nausea and vomiting) seizure post-operatively  . Psychiatric pseudoseizure   . Depression    Past Surgical History  Procedure Laterality Date  . Gab  2007    in [place]-POUCH 5 CM  . Cholecystectomy  2005    biliary dyskinesia  . Cath self every nite      for sodium bicarb injection (discontinued 2013)  . Colonoscopy  JUN 2012 ABD PN/DIARRHEA WITH PROPOFOL    NL COLON  . Upper gastrointestinal endoscopy  JULY 2011 NAUSEA-D125,V6, PH 25    Bx; GASTRITIS, POUCH-5 CM LONG  . Dilation and curettage of uterus    . Tonsillectomy    . Gastric bypass  06/2006  . [place] dilation  06/20/2012    Dr. Barnie Alderman gastritis/Ulcer in the mid jejunum. Empiric dilation.   . Tonsillectomy and adenoidectomy    . Esophagogastroduodenoscopy    . Wisdom tooth extraction    . Repair vaginal cuff N/A 07/30/2014    Procedure: REPAIR VAGINAL CUFF;  Surgeon: Mora Bellman, MD;  Location: [facility];  Service: Gynecology;  Laterality: N/A;  . Hysteroscopy w/d&c N/A 09/12/2014    Procedure: DILATATION AND CURETTAGE /HYSTEROSCOPY;  Surgeon: Jonnie Kind, MD;  Location: [facility];  Service: Gynecology;  Laterality: N/A;  . Esophagogastroduodenoscopy (egd) with propofol N/A 04/10/2015    Procedure: ESOPHAGOGASTRODUODENOSCOPY (EGD) WITH PROPOFOL;  Surgeon: Daneil Dolin, MD;  Location: [facility];  Service: Endoscopy;  Laterality: N/A;  . Esophageal dilation N/A 04/10/2015    Procedure: ESOPHAGEAL DILATION WITH 54FR MALONEY DILATOR;  Surgeon: Daneil Dolin, MD;  Location: [facility];  Service: Endoscopy;  Laterality: N/A;  . Esophageal biopsy  04/10/2015    Procedure: BIOPSY;  Surgeon: Daneil Dolin, MD;  Location: [facility];  Service: Endoscopy;;   Family History  Problem Relation Age of Onset  . Hemochromatosis Maternal Grandmother   . Migraines Maternal Grandmother   . Cancer Maternal Grandmother   . Breast cancer Maternal  Grandmother   . Hypertension Father   . Diabetes Father   . Coronary artery disease Father   . Migraines Paternal Grandmother   . Breast cancer Paternal Grandmother   . Cancer Mother     breast  . Hemochromatosis Mother   . Breast cancer Mother   . Depression Mother   . Anxiety disorder Mother   . Coronary artery disease Paternal Grandfather   . Anxiety disorder Brother   . Bipolar disorder Brother    Social History  Substance Use Topics  . Smoking status: Former Smoker -- 0.25 packs/day for 6 years    Types: Cigarettes    Quit date: 08/10/2015  . Smokeless tobacco: Never Used  . Alcohol Use: 0.0 oz/week    0 Standard drinks or equivalent per week     Comment: once every weeks - maybe a beer with a dinner   OB History    Gravida Para Term Preterm AB TAB SAB Ectopic Multiple Living   '3 2 1 1 1  1   2     '$ Review of Systems  Constitutional: Negative for fever.  Musculoskeletal: Positive for arthralgias. Negative for myalgias and joint swelling.  Neurological: Negative for weakness and numbness.   Allergies  Gabapentin; Metoclopramide hcl; Codeine; Zofran; Morphine and related; Latex; and Tape  Home Medications   Prior to Admission medications   Medication Sig Start Date End Date Taking? Authorizing Provider  amphetamine-dextroamphetamine (ADDERALL XR) 30 MG 24 hr capsule Take 1 capsule (30 mg total) by mouth daily. 09/17/15  Yes Cloria Spring, MD  carbamazepine (TEGRETOL) 200 MG tablet Take two tablets at bedtime Patient taking differently: Take 400 mg by mouth at bedtime.  09/17/15  Yes Cloria Spring, MD  clonazePAM (KLONOPIN) 0.5 MG tablet Take 1 tablet (0.5 mg total) by mouth 3 (three) times daily as needed for anxiety. 09/17/15 09/16/16 Yes Cloria Spring, MD  cyclobenzaprine (FLEXERIL) 10 MG tablet Take 1 tablet (10 mg total) by mouth 3 (three) times daily as needed for muscle spasms. During the day; do not take with Zanaflex 09/12/15  Yes Kathyrn Drown, MD   DULoxetine (CYMBALTA) 60 MG capsule Take 1 capsule (60 mg total) by mouth daily. 09/17/15  Yes Cloria Spring, MD  phenazopyridine (PYRIDIUM) 200 MG tablet Take 1 tablet (200 mg total) by mouth 3 (three) times daily with meals. X 7 days Patient taking differently: Take 200 mg by mouth 3 (three) times daily as needed for pain.  09/18/15  Yes Kathyrn Drown, MD  promethazine (PHENERGAN) 25 MG tablet Take 1 tablet (25 mg total) by mouth every 6 (six) hours as needed for nausea or vomiting. 06/11/15  Yes Ezequiel Essex, MD  tiZANidine (ZANAFLEX) 4 MG tablet Take  1 tablet (4 mg total) by mouth 3 (three) times daily as needed for muscle spasms. 07/21/15  Yes Kathyrn Drown, MD  topiramate (TOPAMAX) 50 MG tablet Take 1 tablet in the morning and 2 tablets in the evening as needed for migraines. 09/25/15  Yes Kathyrn Drown, MD  traZODone (DESYREL) 150 MG tablet Take 1 tablet (150 mg total) by mouth at bedtime as needed for sleep. 09/17/15  Yes Cloria Spring, MD  fluconazole (DIFLUCAN) 150 MG tablet Take 1 tablet (150 mg total) by mouth daily. 3 days apart Patient not taking: Reported on 10/05/2015 09/18/15   Kathyrn Drown, MD  ibuprofen (ADVIL,MOTRIN) 800 MG tablet Take 1 tablet (800 mg total) by mouth 3 (three) times daily. 10/05/15   Evalee Jefferson, PA-C   BP 123/79 mmHg  Pulse 75  Temp(Src) 98.1 F (36.7 C) (Oral)  Resp 18  Ht $R'5\' 6"'GA$  (1.676 m)  Wt 198 lb 8 oz (90.039 kg)  BMI 32.05 kg/m2  SpO2 100%  LMP 09/05/2015 Physical Exam  Constitutional: She appears well-developed and well-nourished.  HENT:  Head: Atraumatic.  Neck: Normal range of motion.  Cardiovascular:  Pulses equal bilaterally  Musculoskeletal: She exhibits tenderness.       Left knee: Normal.  Tenderness to palpation of the left medial and lateral malleoli; tenderness to palpation along the lateral left foot to her toes; no deformity, swelling, redness, or bruising; distal sensations intact; limited ROM of toes secondary to pain;   No proximal fibular tenderness  Neurological: She is alert. She has normal strength. She displays normal reflexes. No sensory deficit.  Skin: Skin is warm and dry.  Psychiatric: She has a normal mood and affect.    ED Course  Procedures (including critical care time) DIAGNOSTIC STUDIES: Oxygen Saturation is 100% on RA, normal by my interpretation.    COORDINATION OF CARE: 2:23 PM-Discussed treatment plan which includes x-rays, RICE techniques, crutches, and follow up with PCP with pt at bedside and pt agreed to plan.    Labs Review Labs Reviewed - No data to display  Imaging Review Dg Ankle Complete Left  10/05/2015  CLINICAL DATA:  LEFT ANKLE PAIN, PT c/o left ankle pain after rolling it x2 days ago. HISTORY OF HEART MURMUR EXAM: LEFT ANKLE COMPLETE - 3+ VIEW COMPARISON:  None. FINDINGS: There is no evidence of fracture, dislocation, or joint effusion. There is no evidence of arthropathy or other focal bone abnormality. Soft tissues are unremarkable. IMPRESSION: Negative. Electronically Signed   By: Lucrezia Europe M.D.   On: 10/05/2015 13:54   Dg Foot Complete Left  10/05/2015  CLINICAL DATA:  Fall.  Pain EXAM: LEFT FOOT - COMPLETE 3+ VIEW COMPARISON:  None. FINDINGS: There is no evidence of fracture or dislocation. There is no evidence of arthropathy or other focal bone abnormality. Soft tissues are unremarkable. IMPRESSION: Negative. Electronically Signed   By: Kerby Moors M.D.   On: 10/05/2015 16:08   I have personally reviewed and evaluated these images and lab results as part of my medical decision-making.   EKG Interpretation None      MDM   Final diagnoses:  Ankle sprain, left, initial encounter     Radiological studies were viewed, interpreted and considered during the medical decision making and disposition process. I agree with radiologists reading.  Results were also discussed with patient.    I personally performed the services described in this documentation,  which was scribed in my presence. The recorded information has been  reviewed and is accurate.      Evalee Jefferson, PA-C 10/05/15 [place], DO 10/08/15 1426

## 2015-10-05 NOTE — ED Notes (Signed)
PT c/o left ankle pain after rolling it x2 days ago. PT c/o pain with ambulation.

## 2015-10-07 ENCOUNTER — Encounter: Payer: Self-pay | Admitting: Family Medicine

## 2015-10-07 ENCOUNTER — Ambulatory Visit (INDEPENDENT_AMBULATORY_CARE_PROVIDER_SITE_OTHER): Payer: Medicaid Other | Admitting: Family Medicine

## 2015-10-07 VITALS — Ht 66.0 in | Wt 216.0 lb

## 2015-10-07 DIAGNOSIS — L03221 Cellulitis of neck: Secondary | ICD-10-CM | POA: Diagnosis not present

## 2015-10-07 MED ORDER — HYDROCODONE-ACETAMINOPHEN 5-325 MG PO TABS: 1.0000 | ORAL_TABLET | Freq: Four times a day (QID) | ORAL | Status: DC | PRN

## 2015-10-07 MED ORDER — FLUCONAZOLE 150 MG PO TABS: 150.0000 mg | ORAL_TABLET | Freq: Once | ORAL | Status: DC

## 2015-10-07 MED ORDER — DOXYCYCLINE HYCLATE 100 MG PO CAPS: 100.0000 mg | ORAL_CAPSULE | Freq: Two times a day (BID) | ORAL | Status: DC

## 2015-10-07 NOTE — Progress Notes (Signed)
   Subjective:    Patient ID: Penny [name], female    DOB: 11/02/1986, 29 y.o.   MRN: [id number]  HPI Patient arrives with neck pain- knot in neck - cant turn neck to left. Patient relates pain discomfort swelling in the back behind her ear present for the past 3-4 days progressively worse with tenderness no high fever chills sweats or headache. No vomiting or diarrhea  Review of Systems    The patient was seen after hours to prevent an emergency department visit  Objective:   Physical Exam  The patient has enlarged area on side of neck right behind the left ear red and tender with excoriated skin no abscess is noted he does have some localized edema a couple small lymph nodes on posterior neck noted as well rest of neck is normal faces normal years normal      Assessment & Plan:  Cellulitis-warm compresses doxycycline twice a day pain medication when absolutely necessary cautioned drowsiness not for long-term use follow-up if progressive troubles warm compresses frequently warning signs were discussed

## 2015-10-08 ENCOUNTER — Ambulatory Visit: Payer: Medicaid Other | Admitting: Family Medicine

## 2015-10-09 ENCOUNTER — Encounter (HOSPITAL_COMMUNITY): Payer: Self-pay | Admitting: Psychiatry

## 2015-10-09 ENCOUNTER — Ambulatory Visit (INDEPENDENT_AMBULATORY_CARE_PROVIDER_SITE_OTHER): Payer: Medicaid Other | Admitting: Psychiatry

## 2015-10-09 DIAGNOSIS — F332 Major depressive disorder, recurrent severe without psychotic features: Secondary | ICD-10-CM
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
## 2015-10-09 NOTE — Patient Instructions (Signed)
Discussed orally 

## 2015-10-13 ENCOUNTER — Telehealth: Payer: Self-pay | Admitting: *Deleted

## 2015-10-13 NOTE — Telephone Encounter (Signed)
ERROR

## 2015-10-14 ENCOUNTER — Ambulatory Visit: Payer: Medicaid Other | Admitting: Family Medicine

## 2015-10-15 ENCOUNTER — Other Ambulatory Visit (HOSPITAL_COMMUNITY): Payer: Self-pay | Admitting: Psychiatry

## 2015-10-15 ENCOUNTER — Telehealth (HOSPITAL_COMMUNITY): Payer: Self-pay | Admitting: *Deleted

## 2015-10-15 ENCOUNTER — Telehealth: Payer: Self-pay | Admitting: Family Medicine

## 2015-10-15 ENCOUNTER — Ambulatory Visit (HOSPITAL_COMMUNITY)
Admission: RE | Admit: 2015-10-15 | Discharge: 2015-10-15 | Disposition: A | Discharge status: Home / Self Care | Payer: Medicaid Other | Source: Ambulatory Visit | Attending: Family Medicine | Admitting: Family Medicine

## 2015-10-15 ENCOUNTER — Ambulatory Visit (INDEPENDENT_AMBULATORY_CARE_PROVIDER_SITE_OTHER): Payer: Medicaid Other | Admitting: Family Medicine

## 2015-10-15 ENCOUNTER — Encounter: Payer: Self-pay | Admitting: Family Medicine

## 2015-10-15 ENCOUNTER — Encounter (HOSPITAL_COMMUNITY): Payer: Self-pay | Admitting: Psychiatry

## 2015-10-15 ENCOUNTER — Ambulatory Visit (INDEPENDENT_AMBULATORY_CARE_PROVIDER_SITE_OTHER): Payer: Medicaid Other | Admitting: Psychiatry

## 2015-10-15 VITALS — BP 110/72 | HR 67 | Ht 66.0 in | Wt 199.0 lb

## 2015-10-15 VITALS — BP 122/82 | Ht 66.0 in | Wt 198.6 lb

## 2015-10-15 DIAGNOSIS — M79605 Pain in left leg: Secondary | ICD-10-CM | POA: Insufficient documentation

## 2015-10-15 DIAGNOSIS — F332 Major depressive disorder, recurrent severe without psychotic features: Secondary | ICD-10-CM | POA: Diagnosis not present

## 2015-10-15 MED ORDER — TRAZODONE HCL 150 MG PO TABS: 150.0000 mg | ORAL_TABLET | Freq: Every evening | ORAL | Status: DC | PRN

## 2015-10-15 MED ORDER — AMPHETAMINE-DEXTROAMPHET ER 30 MG PO CP24: 30.0000 mg | ORAL_CAPSULE | Freq: Every day | ORAL | Status: DC

## 2015-10-15 MED ORDER — DULOXETINE HCL 60 MG PO CPEP: 60.0000 mg | ORAL_CAPSULE | Freq: Every day | ORAL | Status: DC

## 2015-10-15 MED ORDER — CARBAMAZEPINE 200 MG PO TABS: ORAL_TABLET | ORAL | Status: DC

## 2015-10-15 NOTE — Telephone Encounter (Signed)
Pt pharmacy wanting permission from Dr. Harrington Challenger to fill brand name for pt Tegretol due to her being on Medicaid. Per pt pharmacy, medicaid will only pay for Brand name for this medication. Pt pharmacy number is [phone].

## 2015-10-15 NOTE — Telephone Encounter (Signed)
Noted, sometimes sprains can injure the nerve that runs to the foot causing the toes to feel cold and caused pins and needles this should gradually get better over time hopefully over the next 7-10 days. See x-ray report
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
## 2015-10-15 NOTE — Telephone Encounter (Signed)
FYI  Pt calling to let you know that from you and the xray tech  Manipulating her leg so much she now has pins an needles  Kind of pain in them to her knee, as well as shooting pain in  Her calves, an her toes are still cold.

## 2015-10-15 NOTE — Progress Notes (Signed)
   Subjective:    Patient ID: Penny [name], female    DOB: 10/20/1986, 29 y.o.   MRN: [id number]  HPI Patient arrives to follow up on recent ER visit for left leg sprain and follow up on knot on neck. Patient states the doxycycline and the hydrocodone has not helped at all. Patient relates that she is having a burning sensation in the left foot aching on the side of the foot feels like her toes are cold she states she has the needles lower leg all this occurred after a fall. She landed on the lower leg ankle and foot ER did x-rays of the ankle foot these were negative. In addition to this patient with a sore area on her scalp behind the left ear she took antibiotics is putting a topical treatment on she relates burning and pain  Review of Systems Denies fever chills sweats denies swelling in the lower leg    Objective:   Physical Exam patient with subjected discomfort lateral foot no swelling noted no blueness pulses are normal foot feels normal to touch in regards to temperature ankle normal lower leg tender to the touch no swelling noted behind the left ear there is an excoriated area in the skin that appears to be healing compared to how was last time I saw her      Assessment & Plan:  Skin lesion behind the ear seems to be healing it is hard to tell why this occurred I do not feel it needs further treatment may use topical anesthetics  Lower leg pain we will x-ray tibia fibula in addition to this patient may need limited bone scan to rule out stress fracture if ongoing discomfort over the next 7-10 days patient will let us know.  I do not recommend any stronger pain medication. I do not recommend ongoing pain medicine.

## 2015-10-15 NOTE — Telephone Encounter (Signed)
ok 

## 2015-10-16 ENCOUNTER — Telehealth (HOSPITAL_COMMUNITY): Payer: Self-pay | Admitting: *Deleted

## 2015-10-16 NOTE — Telephone Encounter (Signed)
Called and spoke with patient and informed her per Dr.Scott Luking-Sometimes sprains can injure the nerve that runs to the foot causing the toes to feel cold and caused pins and needles this should gradually get better over time hopefully over the next 7-10 days. Patient verbalized understanding.

## 2015-10-16 NOTE — Telephone Encounter (Signed)
Called pt pharmacy and spoke with Ovid Curd and informed him of what Dr. Harrington Challenger stated and he showed understanding

## 2015-10-16 NOTE — Telephone Encounter (Signed)
Pt pharmacy requesting refills for pt Clonazepam. Pt medication was last printed on 09-2015.

## 2015-10-17 ENCOUNTER — Telehealth (HOSPITAL_COMMUNITY): Payer: Self-pay | Admitting: *Deleted

## 2015-10-17 MED ORDER — CLONAZEPAM 0.5 MG PO TABS: 0.5000 mg | ORAL_TABLET | Freq: Three times a day (TID) | ORAL | Status: DC | PRN

## 2015-10-17 NOTE — Telephone Encounter (Signed)
You may call in 2 more refills

## 2015-10-17 NOTE — Telephone Encounter (Signed)
Medication called in and spoke with Ovid Curd

## 2015-10-17 NOTE — Telephone Encounter (Signed)
Per Dr. Harrington Challenger to call in with 2 refills. Spoke with Ovid Curd and he stated he will get script ready for her. Pt is aware and shows understand.

## 2015-10-18 ENCOUNTER — Emergency Department (HOSPITAL_COMMUNITY): Payer: Medicaid Other

## 2015-10-18 ENCOUNTER — Emergency Department (HOSPITAL_COMMUNITY)
Admission: EM | Admit: 2015-10-18 | Discharge: 2015-10-18 | Disposition: A | Discharge status: Home / Self Care | Payer: Medicaid Other | Attending: Emergency Medicine | Admitting: Emergency Medicine

## 2015-10-18 ENCOUNTER — Encounter (HOSPITAL_COMMUNITY): Payer: Self-pay | Admitting: *Deleted

## 2015-10-18 DIAGNOSIS — Z91040 Latex allergy status: Secondary | ICD-10-CM | POA: Diagnosis not present

## 2015-10-18 DIAGNOSIS — Z8679 Personal history of other diseases of the circulatory system: Secondary | ICD-10-CM | POA: Insufficient documentation

## 2015-10-18 DIAGNOSIS — M797 Fibromyalgia: Secondary | ICD-10-CM | POA: Diagnosis not present

## 2015-10-18 DIAGNOSIS — F329 Major depressive disorder, single episode, unspecified: Secondary | ICD-10-CM | POA: Diagnosis not present

## 2015-10-18 DIAGNOSIS — Z9884 Bariatric surgery status: Secondary | ICD-10-CM | POA: Insufficient documentation

## 2015-10-18 DIAGNOSIS — Z975 Presence of (intrauterine) contraceptive device: Secondary | ICD-10-CM | POA: Diagnosis not present

## 2015-10-18 DIAGNOSIS — R011 Cardiac murmur, unspecified: Secondary | ICD-10-CM | POA: Insufficient documentation

## 2015-10-18 DIAGNOSIS — Z792 Long term (current) use of antibiotics: Secondary | ICD-10-CM | POA: Insufficient documentation

## 2015-10-18 DIAGNOSIS — W1849XA Other slipping, tripping and stumbling without falling, initial encounter: Secondary | ICD-10-CM | POA: Diagnosis not present

## 2015-10-18 DIAGNOSIS — Y998 Other external cause status: Secondary | ICD-10-CM | POA: Insufficient documentation

## 2015-10-18 DIAGNOSIS — Z79899 Other long term (current) drug therapy: Secondary | ICD-10-CM | POA: Insufficient documentation

## 2015-10-18 DIAGNOSIS — Y9301 Activity, walking, marching and hiking: Secondary | ICD-10-CM | POA: Diagnosis not present

## 2015-10-18 DIAGNOSIS — G43909 Migraine, unspecified, not intractable, without status migrainosus: Secondary | ICD-10-CM | POA: Diagnosis not present

## 2015-10-18 DIAGNOSIS — G8929 Other chronic pain: Secondary | ICD-10-CM | POA: Insufficient documentation

## 2015-10-18 DIAGNOSIS — F909 Attention-deficit hyperactivity disorder, unspecified type: Secondary | ICD-10-CM | POA: Diagnosis not present

## 2015-10-18 DIAGNOSIS — F419 Anxiety disorder, unspecified: Secondary | ICD-10-CM | POA: Diagnosis not present

## 2015-10-18 DIAGNOSIS — Z862 Personal history of diseases of the blood and blood-forming organs and certain disorders involving the immune mechanism: Secondary | ICD-10-CM | POA: Diagnosis not present

## 2015-10-18 DIAGNOSIS — Z8719 Personal history of other diseases of the digestive system: Secondary | ICD-10-CM | POA: Insufficient documentation

## 2015-10-18 DIAGNOSIS — S99912A Unspecified injury of left ankle, initial encounter: Secondary | ICD-10-CM | POA: Diagnosis present

## 2015-10-18 DIAGNOSIS — Y9289 Other specified places as the place of occurrence of the external cause: Secondary | ICD-10-CM | POA: Insufficient documentation

## 2015-10-18 DIAGNOSIS — S93402A Sprain of unspecified ligament of left ankle, initial encounter: Secondary | ICD-10-CM | POA: Diagnosis not present

## 2015-10-18 DIAGNOSIS — R000 Tachycardia, unspecified: Secondary | ICD-10-CM | POA: Insufficient documentation

## 2015-10-18 DIAGNOSIS — Z8639 Personal history of other endocrine, nutritional and metabolic disease: Secondary | ICD-10-CM | POA: Diagnosis not present

## 2015-10-18 DIAGNOSIS — Z87448 Personal history of other diseases of urinary system: Secondary | ICD-10-CM | POA: Diagnosis not present

## 2015-10-18 DIAGNOSIS — Z87891 Personal history of nicotine dependence: Secondary | ICD-10-CM | POA: Diagnosis not present

## 2015-10-18 DIAGNOSIS — Z8742 Personal history of other diseases of the female genital tract: Secondary | ICD-10-CM | POA: Insufficient documentation

## 2015-10-18 MED ORDER — NAPROXEN 500 MG PO TABS: 500.0000 mg | ORAL_TABLET | Freq: Two times a day (BID) | ORAL | Status: DC

## 2015-10-18 MED ORDER — TRAMADOL HCL 50 MG PO TABS: 50.0000 mg | ORAL_TABLET | Freq: Four times a day (QID) | ORAL | Status: DC | PRN

## 2015-10-18 MED ORDER — HYDROCODONE-ACETAMINOPHEN 5-325 MG PO TABS
1.0000 | ORAL_TABLET | Freq: Once | ORAL | Status: AC
  Administered 2015-10-18: 1 via ORAL
  Filled 2015-10-18: qty 1

## 2015-10-18 NOTE — ED Notes (Signed)
Pt unhappy about not receiving narcotics. NP notified.

## 2015-10-18 NOTE — Discharge Instructions (Signed)
Follow up with the orthopedic doctor for further evaluation of the ankle injury.

## 2015-10-18 NOTE — ED Notes (Signed)
Pt refusing ASO splint and crutches, states she has them already and doesn't want them.

## 2015-10-18 NOTE — ED Notes (Signed)
Pt reports rolling her ankle inward again this evening, states about three hours ago. Reports she took ibuprofen at that time.

## 2015-10-18 NOTE — ED Notes (Signed)
The pt injured her lt foot and ankle one week ago  .  She is still having pain  lmp 2-3 days ago

## 2015-10-18 NOTE — ED Notes (Signed)
Pt left at this time with all belongings.  

## 2015-10-18 NOTE — ED Provider Notes (Signed)
CSN: [id number]     Arrival date & time 10/18/15  0033 History   First MD Initiated Contact with Patient 10/18/15 0044     Chief Complaint  Patient presents with  . Ankle Pain     (Consider location/radiation/quality/duration/timing/severity/associated sxs/prior Treatment) Patient is a 29 y.o. female presenting with ankle pain. The history is provided by the patient.  Ankle Pain Location:  Ankle Time since incident:  3 hours Injury: yes   Ankle location:  L ankle Pain details:    Quality:  Throbbing and shooting   Radiates to:  L leg   Severity:  Severe   Onset quality:  Sudden   Timing:  Constant   Progression:  Worsening Chronicity:  New Dislocation: no   Foreign body present:  No foreign bodies Prior injury to area:  Yes Relieved by:  Nothing Worsened by:  Activity and bearing weight Associated symptoms: decreased ROM and swelling    BRAYLEA BRANCATO is a 29 y.o. female who presents to the ED with left foot and ankle pain. She was evaluated 10/05/15 at [facility] ED after she turned her foot while walking 2 days prior to the ED visit.  She had x-ray of the left foot that showed no fracture or dislocation. She was treated with ibuprofen.   Patient here tonight reporting that when leaving work tonight she turned the left ankle again and has severe pain to the lateral aspect that radiates up the side of her left leg.   Past Medical History  Diagnosis Date  . Migraines   . Interstitial cystitis   . IUD FEB 2010  . Gastritis JULY 2011  . Depression   . 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 Fatty liver   . Obesity (BMI 30-39.9) 2011 228 LBS BMI 36.8  . Iron deficiency anemia 07/23/2010  . Irritable bowel syndrome 2012 DIARRHEA    JUN 2012 TTG IgA 14.9  . Elevated liver enzymes JUL 2011ALK PHOS 111-127 AST  143-267 ALT  213-321T BILI 0.6  ALB  3.7-4.06 Jun 2011 ALK PHOS 118 AST 24 ALT 42 T BILI 0.4 ALB 3.9  . Chronic daily headache   . Ovarian cyst   . ADD  (attention deficit disorder)   . RLQ abdominal pain 07/18/2013  . Menorrhagia 07/18/2013  . Anxiety   . Potassium (K) deficiency   . Dysmenorrhea 09/18/2013  . Stress 09/18/2013  . Patient desires pregnancy 09/18/2013  . Pregnant 12/25/2013  . Blood transfusion without reported diagnosis   . Heart murmur   . Gastric bypass status for obesity   . 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 Dysrhythmia   . Sciatica of left side 11/14/2014  . Fibromyalgia   . 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 PONV (postoperative nausea and vomiting) seizure post-operatively  . Psychiatric pseudoseizure   . Depression    Past Surgical History  Procedure Laterality Date  . Gab  2007    in [place]-POUCH 5 CM  . Cholecystectomy  2005    biliary dyskinesia  . Cath self every nite      for sodium bicarb injection (discontinued 2013)  . Colonoscopy  JUN 2012 ABD PN/DIARRHEA WITH PROPOFOL    NL COLON  . Upper gastrointestinal endoscopy  JULY 2011 NAUSEA-D125,V6, PH 25    Bx; GASTRITIS, POUCH-5 CM LONG  . Dilation and curettage of uterus    . Tonsillectomy    .  Gastric bypass  06/2006  . Savory dilation  06/20/2012    Dr. Barnie Alderman gastritis/Ulcer in the mid jejunum. Empiric dilation.   . Tonsillectomy and adenoidectomy    . Esophagogastroduodenoscopy    . Wisdom tooth extraction    . Repair vaginal cuff N/A 07/30/2014    Procedure: REPAIR VAGINAL CUFF;  Surgeon: Mora Bellman, MD;  Location: [facility];  Service: Gynecology;  Laterality: N/A;  . Hysteroscopy w/d&c N/A 09/12/2014    Procedure: DILATATION AND CURETTAGE /HYSTEROSCOPY;  Surgeon: Jonnie Kind, MD;  Location: [facility];  Service: Gynecology;  Laterality: N/A;  . Esophagogastroduodenoscopy (egd) with propofol N/A 04/10/2015    Procedure: ESOPHAGOGASTRODUODENOSCOPY (EGD) WITH PROPOFOL;  Surgeon: Daneil Dolin, MD;  Location: [facility];  Service: Endoscopy;  Laterality: N/A;  . Esophageal dilation N/A 04/10/2015     Procedure: ESOPHAGEAL DILATION WITH 54FR MALONEY DILATOR;  Surgeon: Daneil Dolin, MD;  Location: [facility];  Service: Endoscopy;  Laterality: N/A;  . Esophageal biopsy  04/10/2015    Procedure: BIOPSY;  Surgeon: Daneil Dolin, MD;  Location: [facility];  Service: Endoscopy;;   Family History  Problem Relation Age of Onset  . Hemochromatosis Maternal Grandmother   . Migraines Maternal Grandmother   . Cancer Maternal Grandmother   . Breast cancer Maternal Grandmother   . Hypertension Father   . Diabetes Father   . Coronary artery disease Father   . Migraines Paternal Grandmother   . Breast cancer Paternal Grandmother   . Cancer Mother     breast  . Hemochromatosis Mother   . Breast cancer Mother   . Depression Mother   . Anxiety disorder Mother   . Coronary artery disease Paternal Grandfather   . Anxiety disorder Brother   . Bipolar disorder Brother    Social History  Substance Use Topics  . Smoking status: Former Smoker -- 0.25 packs/day for 6 years    Types: Cigarettes    Quit date: 08/10/2015  . Smokeless tobacco: Never Used  . Alcohol Use: 0.0 oz/week    0 Standard drinks or equivalent per week     Comment: once every weeks - maybe a beer with a dinner   OB History    Gravida Para Term Preterm AB TAB SAB Ectopic Multiple Living   '3 2 1 1 1  1   2     '$ Review of Systems  Musculoskeletal:       Left foot and ankle pain   All other systems negative   Allergies  Gabapentin; Metoclopramide hcl; Codeine; Zofran; Morphine and related; Latex; and Tape  Home Medications   Prior to Admission medications   Medication Sig Start Date End Date Taking? Authorizing Provider  amphetamine-dextroamphetamine (ADDERALL XR) 30 MG 24 hr capsule Take 1 capsule (30 mg total) by mouth daily. 10/15/15   Cloria Spring, MD  amphetamine-dextroamphetamine (ADDERALL XR) 30 MG 24 hr capsule Take 1 capsule (30 mg total) by mouth daily. 10/15/15   Cloria Spring, MD  carbamazepine (TEGRETOL) 200 MG  tablet Take two tablets at bedtime 10/15/15   Cloria Spring, MD  clonazePAM (KLONOPIN) 0.5 MG tablet Take 1 tablet (0.5 mg total) by mouth 3 (three) times daily as needed for anxiety. 10/17/15 10/16/16  Cloria Spring, MD  doxycycline (VIBRAMYCIN) 100 MG capsule Take 1 capsule (100 mg total) by mouth 2 (two) times daily. 10/07/15   Kathyrn Drown, MD  DULoxetine (CYMBALTA) 60 MG capsule Take 1 capsule (60 mg total) by  mouth daily. 10/15/15   Cloria Spring, MD  fluconazole (DIFLUCAN) 150 MG tablet Take 1 tablet (150 mg total) by mouth once. 10/07/15   Kathyrn Drown, MD  naproxen (NAPROSYN) 500 MG tablet Take 1 tablet (500 mg total) by mouth 2 (two) times daily. 10/18/15   Thecla Forgione Bunnie Pion, NP  phenazopyridine (PYRIDIUM) 200 MG tablet Take 1 tablet (200 mg total) by mouth 3 (three) times daily with meals. X 7 days Patient taking differently: Take 200 mg by mouth 3 (three) times daily as needed for pain.  09/18/15   Kathyrn Drown, MD  promethazine (PHENERGAN) 25 MG tablet Take 1 tablet (25 mg total) by mouth every 6 (six) hours as needed for nausea or vomiting. 06/11/15   Ezequiel Essex, MD  tiZANidine (ZANAFLEX) 4 MG tablet Take 1 tablet (4 mg total) by mouth 3 (three) times daily as needed for muscle spasms. 07/21/15   Kathyrn Drown, MD  topiramate (TOPAMAX) 50 MG tablet Take 1 tablet in the morning and 2 tablets in the evening as needed for migraines. 09/25/15   Kathyrn Drown, MD  traZODone (DESYREL) 150 MG tablet Take 1 tablet (150 mg total) by mouth at bedtime as needed for sleep. 10/15/15   Cloria Spring, MD   BP 142/94 mmHg  Pulse 106  Temp(Src) 97.9 F (36.6 C) (Oral)  Resp 20  Ht $R'5\' 7"'Ow$  (1.702 m)  Wt 200 lb (90.719 kg)  BMI 31.32 kg/m2  SpO2 100%  LMP 10/11/2015 Physical Exam  Constitutional: She is oriented to person, place, and time. She appears well-developed and well-nourished. No distress.  Ambulatory to the exam room with steady gait.   HENT:  Head: Normocephalic and atraumatic.   Eyes: Conjunctivae and EOM are normal.  Neck: Neck supple.  Cardiovascular: Tachycardia present.   Pulmonary/Chest: Effort normal.  Musculoskeletal: Normal range of motion.       Left ankle: She exhibits no ecchymosis, no deformity, no laceration and normal pulse. Decreased range of motion: due to pain. Swelling: lateral aspect. Tenderness. Lateral malleolus tenderness found. Achilles tendon normal.  Neurological: She is alert and oriented to person, place, and time. No cranial nerve deficit.  Skin: Skin is warm and dry.  Psychiatric: She has a normal mood and affect. Her behavior is normal.  Nursing note and vitals reviewed.   ED Course  Procedures (including critical care time) Labs Review Labs Reviewed - No data to display  Imaging Review Dg Ankle Complete Left  10/18/2015  CLINICAL DATA:  Status post fall, with twisting injury to left ankle. Left lateral ankle pain and swelling. Initial encounter. EXAM: LEFT ANKLE COMPLETE - 3+ VIEW COMPARISON:  Left ankle radiographs performed 10/05/2015 FINDINGS: There is no evidence of fracture or dislocation. The ankle mortise is intact; the interosseous space is within normal limits. No talar tilt or subluxation is seen. An os trigonum is noted. The joint spaces are preserved. No significant soft tissue abnormalities are seen. IMPRESSION: 1. No evidence of fracture or dislocation. 2. Os trigonum noted. Electronically Signed   By: Garald Balding M.D.   On: 10/18/2015 01:50    MDM  29 y.o. female with injury to the left ankle that occurred tonight. Hx of injury to same ankle. Patient given ortho referral. Will treat for pain and inflammation, ASO applied, crutches, ice, elevation. Discussed with the patient and all questioned fully answered. She will return here if any problems arise.  Stable for d/c without focal neuro deficits.  Final diagnoses:  Ankle sprain, left, initial encounter       [facility], NP 10/18/15 0201  April Palumbo,  MD 10/18/15 [phone]

## 2015-10-20 ENCOUNTER — Telehealth: Payer: Self-pay | Admitting: Family Medicine

## 2015-10-20 ENCOUNTER — Emergency Department (HOSPITAL_COMMUNITY)
Admission: EM | Admit: 2015-10-20 | Discharge: 2015-10-20 | Disposition: A | Discharge status: Home / Self Care | Payer: Medicaid Other | Attending: Emergency Medicine | Admitting: Emergency Medicine

## 2015-10-20 ENCOUNTER — Encounter (HOSPITAL_COMMUNITY): Payer: Self-pay | Admitting: Emergency Medicine

## 2015-10-20 ENCOUNTER — Emergency Department (HOSPITAL_COMMUNITY): Payer: Medicaid Other

## 2015-10-20 DIAGNOSIS — Z79899 Other long term (current) drug therapy: Secondary | ICD-10-CM | POA: Diagnosis not present

## 2015-10-20 DIAGNOSIS — Z862 Personal history of diseases of the blood and blood-forming organs and certain disorders involving the immune mechanism: Secondary | ICD-10-CM | POA: Insufficient documentation

## 2015-10-20 DIAGNOSIS — R011 Cardiac murmur, unspecified: Secondary | ICD-10-CM | POA: Diagnosis not present

## 2015-10-20 DIAGNOSIS — Z8742 Personal history of other diseases of the female genital tract: Secondary | ICD-10-CM | POA: Insufficient documentation

## 2015-10-20 DIAGNOSIS — M25552 Pain in left hip: Secondary | ICD-10-CM | POA: Diagnosis not present

## 2015-10-20 DIAGNOSIS — Z8719 Personal history of other diseases of the digestive system: Secondary | ICD-10-CM | POA: Insufficient documentation

## 2015-10-20 DIAGNOSIS — Z91040 Latex allergy status: Secondary | ICD-10-CM | POA: Insufficient documentation

## 2015-10-20 DIAGNOSIS — G8929 Other chronic pain: Secondary | ICD-10-CM | POA: Diagnosis not present

## 2015-10-20 DIAGNOSIS — Z87448 Personal history of other diseases of urinary system: Secondary | ICD-10-CM | POA: Diagnosis not present

## 2015-10-20 DIAGNOSIS — G43909 Migraine, unspecified, not intractable, without status migrainosus: Secondary | ICD-10-CM | POA: Diagnosis not present

## 2015-10-20 DIAGNOSIS — Z792 Long term (current) use of antibiotics: Secondary | ICD-10-CM | POA: Insufficient documentation

## 2015-10-20 DIAGNOSIS — F329 Major depressive disorder, single episode, unspecified: Secondary | ICD-10-CM | POA: Diagnosis not present

## 2015-10-20 DIAGNOSIS — Z87891 Personal history of nicotine dependence: Secondary | ICD-10-CM | POA: Diagnosis not present

## 2015-10-20 DIAGNOSIS — M797 Fibromyalgia: Secondary | ICD-10-CM | POA: Insufficient documentation

## 2015-10-20 DIAGNOSIS — Z7289 Other problems related to lifestyle: Secondary | ICD-10-CM | POA: Insufficient documentation

## 2015-10-20 DIAGNOSIS — E669 Obesity, unspecified: Secondary | ICD-10-CM | POA: Diagnosis not present

## 2015-10-20 DIAGNOSIS — M25572 Pain in left ankle and joints of left foot: Secondary | ICD-10-CM | POA: Insufficient documentation

## 2015-10-20 DIAGNOSIS — M79605 Pain in left leg: Secondary | ICD-10-CM

## 2015-10-20 DIAGNOSIS — K5900 Constipation, unspecified: Secondary | ICD-10-CM

## 2015-10-20 DIAGNOSIS — M545 Low back pain: Secondary | ICD-10-CM | POA: Insufficient documentation

## 2015-10-20 DIAGNOSIS — Z765 Malingerer [conscious simulation]: Secondary | ICD-10-CM

## 2015-10-20 DIAGNOSIS — Z791 Long term (current) use of non-steroidal anti-inflammatories (NSAID): Secondary | ICD-10-CM | POA: Diagnosis not present

## 2015-10-20 MED ORDER — IBUPROFEN 800 MG PO TABS: 800.0000 mg | ORAL_TABLET | Freq: Three times a day (TID) | ORAL | Status: DC | PRN

## 2015-10-20 NOTE — ED Notes (Signed)
MD at the bedside  

## 2015-10-20 NOTE — Discharge Instructions (Signed)
Back Pain, Adult °Back pain is very common in adults. The cause of back pain is rarely dangerous and the pain often gets better over time. The cause of your back pain may not be known. Some common causes of back pain include: °· Strain of the muscles or ligaments supporting the spine. °· Wear and tear (degeneration) of the spinal disks. °· Arthritis. °· Direct injury to the back. °For many people, back pain may return. Since back pain is rarely dangerous, most people can learn to manage this condition on their own. °HOME CARE INSTRUCTIONS °Watch your back pain for any changes. The following actions may help to lessen any discomfort you are feeling: °· Remain active. It is stressful on your back to sit or stand in one place for long periods of time. Do not sit, drive, or stand in one place for more than 30 minutes at a time. Take short walks on even surfaces as soon as you are able. Try to increase the length of time you walk each day. °· Exercise regularly as directed by your health care provider. Exercise helps your back heal faster. It also helps avoid future injury by keeping your muscles strong and flexible. °· Do not stay in bed. Resting more than 1-2 days can delay your recovery. °· Pay attention to your body when you bend and lift. The most comfortable positions are those that put less stress on your recovering back. Always use proper lifting techniques, including: °· Bending your knees. °· Keeping the load close to your body. °· Avoiding twisting. °· Find a comfortable position to sleep. Use a firm mattress and lie on your side with your knees slightly bent. If you lie on your back, put a pillow under your knees. °· Avoid feeling anxious or stressed. Stress increases muscle tension and can worsen back pain. It is important to recognize when you are anxious or stressed and learn ways to manage it, such as with exercise. °· Take medicines only as directed by your health care provider. Over-the-counter  medicines to reduce pain and inflammation are often the most helpful. Your health care provider may prescribe muscle relaxant drugs. These medicines help dull your pain so you can more quickly return to your normal activities and healthy exercise. °· Apply ice to the injured area: °· Put ice in a plastic bag. °· Place a towel between your skin and the bag. °· Leave the ice on for 20 minutes, 2-3 times a day for the first 2-3 days. After that, ice and heat may be alternated to reduce pain and spasms. °· Maintain a healthy weight. Excess weight puts extra stress on your back and makes it difficult to maintain good posture. °SEEK MEDICAL CARE IF: °· You have pain that is not relieved with rest or medicine. °· You have increasing pain going down into the legs or buttocks. °· You have pain that does not improve in one week. °· You have night pain. °· You lose weight. °· You have a fever or chills. °SEEK IMMEDIATE MEDICAL CARE IF:  °· You develop new bowel or bladder control problems. °· You have unusual weakness or numbness in your arms or legs. °· You develop nausea or vomiting. °· You develop abdominal pain. °· You feel faint. °  °This information is not intended to replace advice given to you by your health care provider. Make sure you discuss any questions you have with your health care provider. °  °Document Released: 11/22/2005 Document Revised: 12/13/2014 Document Reviewed: 03/26/2014 °Elsevier Interactive Patient Education ©2016 Elsevier   Inc.  Hip Pain Your hip is the joint between your upper legs and your lower pelvis. The bones, cartilage, tendons, and muscles of your hip joint perform a lot of work each day supporting your body weight and allowing you to move around. Hip pain can range from a minor ache to severe pain in one or both of your hips. Pain may be felt on the inside of the hip joint near the groin, or the outside near the buttocks and upper thigh. You may have swelling or stiffness as well.    HOME CARE INSTRUCTIONS   Take medicines only as directed by your health care provider.  Apply ice to the injured area:  Put ice in a plastic bag.  Place a towel between your skin and the bag.  Leave the ice on for 15-20 minutes at a time, 3-4 times a day.  Keep your leg raised (elevated) when possible to lessen swelling.  Avoid activities that cause pain.  Follow specific exercises as directed by your health care provider.  Sleep with a pillow between your legs on your most comfortable side.  Record how often you have hip pain, the location of the pain, and what it feels like. SEEK MEDICAL CARE IF:   You are unable to put weight on your leg.  Your hip is red or swollen or very tender to touch.  Your pain or swelling continues or worsens after 1 week.  You have increasing difficulty walking.  You have a fever. SEEK IMMEDIATE MEDICAL CARE IF:   You have fallen.  You have a sudden increase in pain and swelling in your hip. MAKE SURE YOU:   Understand these instructions.  Will watch your condition.  Will get help right away if you are not doing well or get worse.   This information is not intended to replace advice given to you by your health care provider. Make sure you discuss any questions you have with your health care provider.   Document Released: 05/12/2010 Document Revised: 12/13/2014 Document Reviewed: 07/19/2013 Elsevier Interactive Patient Education 2016 Elsevier Inc.  Ankle Pain Ankle pain is a common symptom. The bones, cartilage, tendons, and muscles of the ankle joint perform a lot of work each day. The ankle joint holds your body weight and allows you to move around. Ankle pain can occur on either side or back of 1 or both ankles. Ankle pain may be sharp and burning or dull and aching. There may be tenderness, stiffness, redness, or warmth around the ankle. The pain occurs more often when a person walks or puts pressure on the ankle. CAUSES  There  are many reasons ankle pain can develop. It is important to work with your caregiver to identify the cause since many conditions can impact the bones, cartilage, muscles, and tendons. Causes for ankle pain include:  Injury, including a break (fracture), sprain, or strain often due to a fall, sports, or a high-impact activity.  Swelling (inflammation) of a tendon (tendonitis).  Achilles tendon rupture.  Ankle instability after repeated sprains and strains.  Poor foot alignment.  Pressure on a nerve (tarsal tunnel syndrome).  Arthritis in the ankle or the lining of the ankle.  Crystal formation in the ankle (gout or pseudogout). DIAGNOSIS  A diagnosis is based on your medical history, your symptoms, results of your physical exam, and results of diagnostic tests. Diagnostic tests may include X-ray exams or a computerized magnetic scan (magnetic resonance imaging, MRI). TREATMENT  Treatment will depend on  the cause of your ankle pain and may include:  Keeping pressure off the ankle and limiting activities.  Using crutches or other walking support (a cane or brace).  Using rest, ice, compression, and elevation.  Participating in physical therapy or home exercises.  Wearing shoe inserts or special shoes.  Losing weight.  Taking medications to reduce pain or swelling or receiving an injection.  Undergoing surgery. HOME CARE INSTRUCTIONS   Only take over-the-counter or prescription medicines for pain, discomfort, or fever as directed by your caregiver.  Put ice on the injured area.  Put ice in a plastic bag.  Place a towel between your skin and the bag.  Leave the ice on for 15-20 minutes at a time, 03-04 times a day.  Keep your leg raised (elevated) when possible to lessen swelling.  Avoid activities that cause ankle pain.  Follow specific exercises as directed by your caregiver.  Record how often you have ankle pain, the location of the pain, and what it feels like.  This information may be helpful to you and your caregiver.  Ask your caregiver about returning to work or sports and whether you should drive.  Follow up with your caregiver for further examination, therapy, or testing as directed. SEEK MEDICAL CARE IF:   Pain or swelling continues or worsens beyond 1 week.  You have an oral temperature above 102 F (38.9 C).  You are feeling unwell or have chills.  You are having an increasingly difficult time with walking.  You have loss of sensation or other new symptoms.  You have questions or concerns. MAKE SURE YOU:   Understand these instructions.  Will watch your condition.  Will get help right away if you are not doing well or get worse.   This information is not intended to replace advice given to you by your health care provider. Make sure you discuss any questions you have with your health care provider.   Document Released: 05/12/2010 Document Revised: 02/14/2012 Document Reviewed: 06/24/2015 Elsevier Interactive Patient Education 2016 [place] for Routine Care of Injuries Theroutine careofmanyinjuriesincludes rest, ice, compression, and elevation (RICE therapy). RICE therapy is often recommended for injuries to soft tissues, such as a muscle strain, ligament injuries, bruises, and overuse injuries. It can also be used for some bony injuries. Using RICE therapy can help to relieve pain, lessen swelling, and enable your body to heal. Rest Rest is required to allow your body to heal. This usually involves reducing your normal activities and avoiding use of the injured part of your body. Generally, you can return to your normal activities when you are comfortable and have been given permission by your health care provider. Ice Icing your injury helps to keep the swelling down, and it lessens pain. Do not apply ice directly to your skin.  Put ice in a plastic bag.  Place a towel between your skin and the  bag.  Leave the ice on for 20 minutes, 2-3 times a day. Do this for as long as you are directed by your health care provider. Compression Compression means putting pressure on the injured area. Compression helps to keep swelling down, gives support, and helps with discomfort. Compression may be done with an elastic bandage. If an elastic bandage has been applied, follow these general tips:  Remove and reapply the bandage every 3-4 hours or as directed by your health care provider.  Make sure the bandage is not wrapped too tightly, because this can cut  off circulation. If part of your body beyond the bandage becomes blue, numb, cold, swollen, or more painful, your bandage is most likely too tight. If this occurs, remove your bandage and reapply it more loosely.  See your health care provider if the bandage seems to be making your problems worse rather than better. Elevation Elevation means keeping the injured area raised. This helps to lessen swelling and decrease pain. If possible, your injured area should be elevated at or above the level of your heart or the center of your chest. [place]? You should seek medical care if:  Your pain and swelling continue.  Your symptoms are getting worse rather than improving. These symptoms may indicate that further evaluation or further X-rays are needed. Sometimes, X-rays may not show a small broken bone (fracture) until a number of days later. Make a follow-up appointment with your health care provider. WHEN SHOULD I SEEK IMMEDIATE MEDICAL CARE? You should seek immediate medical care if:  You have sudden severe pain at or below the area of your injury.  You have redness or increased swelling around your injury.  You have tingling or numbness at or below the area of your injury that does not improve after you remove the elastic bandage.   This information is not intended to replace advice given to you by your health care  provider. Make sure you discuss any questions you have with your health care provider.   Document Released: 03/06/2001 Document Revised: 08/13/2015 Document Reviewed: 10/30/2014 Elsevier Interactive Patient Education [employer].

## 2015-10-20 NOTE — ED Notes (Signed)
Pt previously fell 2 weeks ago and then again last week, now pt has fallen again with use of crutches and turned left ankle. Now has pain in left ankle, knee and lower back.

## 2015-10-20 NOTE — ED Notes (Signed)
Patient given discharge instruction, verbalized understand. Patient wheelchair out of the department.  

## 2015-10-20 NOTE — Telephone Encounter (Signed)
X-rays are negative. If needing ongoing further input next step would be referral to orthopedics if patient interested.

## 2015-10-20 NOTE — Telephone Encounter (Signed)
[facility] 10/20/15

## 2015-10-20 NOTE — ED Provider Notes (Addendum)
TIME SEEN: 5:10 AM  CHIEF COMPLAINT: Alleged fall  HPI: Patient is a 29 y.o. [name] with history of migraines, interstitial cystitis, depression, history of opiate abuse who presents emergency department with complaints of a fall that occurred tonight. She states that 2 weeks ago she fell and twisted her left ankle. She has been using crutches because of the pain. She states she got up to go to the bathroom and fell again injuring her ankle again, left hip, and lower back. She has been having intermittent shooting, pins and needle sensation to her left leg. No numbness or focal weakness. Has been given ibuprofen, tramadol and hydrocodone for this. Has had 2 negative x-rays of her left ankle and left tibia/fibula. Was seen by her PCP Dr. Wolfgang Phoenix on November 9. At that time and his note specifically states to please not give the patient any more pain medication.  ROS: See HPI Constitutional: no fever  Eyes: no drainage  ENT: no runny nose   Cardiovascular:  no chest pain  Resp: no SOB  GI: no vomiting GU: no dysuria Integumentary: no rash  Allergy: no hives  Musculoskeletal: no leg swelling  Neurological: no slurred speech ROS otherwise negative  PAST MEDICAL HISTORY/PAST SURGICAL HISTORY:  Past Medical History  Diagnosis Date  . Migraines   . Interstitial cystitis   . IUD FEB 2010  . Gastritis JULY 2011  . Depression   . 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 Fatty liver   . Obesity (BMI 30-39.9) 2011 228 LBS BMI 36.8  . Iron deficiency anemia 07/23/2010  . Irritable bowel syndrome 2012 DIARRHEA    JUN 2012 TTG IgA 14.9  . Elevated liver enzymes JUL 2011ALK PHOS 111-127 AST  143-267 ALT  213-321T BILI 0.6  ALB  3.7-4.06 Jun 2011 ALK PHOS 118 AST 24 ALT 42 T BILI 0.4 ALB 3.9  . Chronic daily headache   . Ovarian cyst   . ADD (attention deficit disorder)   . RLQ abdominal pain 07/18/2013  . Menorrhagia 07/18/2013  . Anxiety   . Potassium (K) deficiency   . Dysmenorrhea  09/18/2013  . Stress 09/18/2013  . Patient desires pregnancy 09/18/2013  . Pregnant 12/25/2013  . Blood transfusion without reported diagnosis   . Heart murmur   . Gastric bypass status for obesity   . 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 Dysrhythmia   . Sciatica of left side 11/14/2014  . Fibromyalgia   . 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 PONV (postoperative nausea and vomiting) seizure post-operatively  . Psychiatric pseudoseizure   . Depression     MEDICATIONS:  Prior to Admission medications   Medication Sig Start Date End Date Taking? Authorizing Provider  amphetamine-dextroamphetamine (ADDERALL XR) 30 MG 24 hr capsule Take 1 capsule (30 mg total) by mouth daily. 10/15/15   Cloria Spring, MD  amphetamine-dextroamphetamine (ADDERALL XR) 30 MG 24 hr capsule Take 1 capsule (30 mg total) by mouth daily. 10/15/15   Cloria Spring, MD  carbamazepine (TEGRETOL) 200 MG tablet Take two tablets at bedtime 10/15/15   Cloria Spring, MD  clonazePAM (KLONOPIN) 0.5 MG tablet Take 1 tablet (0.5 mg total) by mouth 3 (three) times daily as needed for anxiety. 10/17/15 10/16/16  Cloria Spring, MD  doxycycline (VIBRAMYCIN) 100 MG capsule Take 1 capsule (100 mg total) by mouth 2 (two) times daily. 10/07/15   Kathyrn Drown, MD  DULoxetine (  CYMBALTA) 60 MG capsule Take 1 capsule (60 mg total) by mouth daily. 10/15/15   Cloria Spring, MD  fluconazole (DIFLUCAN) 150 MG tablet Take 1 tablet (150 mg total) by mouth once. 10/07/15   Kathyrn Drown, MD  naproxen (NAPROSYN) 500 MG tablet Take 1 tablet (500 mg total) by mouth 2 (two) times daily. 10/18/15   Hope Bunnie Pion, NP  phenazopyridine (PYRIDIUM) 200 MG tablet Take 1 tablet (200 mg total) by mouth 3 (three) times daily with meals. X 7 days Patient taking differently: Take 200 mg by mouth 3 (three) times daily as needed for pain.  09/18/15   Kathyrn Drown, MD  promethazine (PHENERGAN) 25 MG tablet Take 1 tablet (25 mg  total) by mouth every 6 (six) hours as needed for nausea or vomiting. 06/11/15   Ezequiel Essex, MD  tiZANidine (ZANAFLEX) 4 MG tablet Take 1 tablet (4 mg total) by mouth 3 (three) times daily as needed for muscle spasms. 8/Penny/16   Kathyrn Drown, MD  topiramate (TOPAMAX) 50 MG tablet Take 1 tablet in the morning and 2 tablets in the evening as needed for migraines. 09/25/15   Kathyrn Drown, MD  traMADol (ULTRAM) 50 MG tablet Take 1 tablet (50 mg total) by mouth every 6 (six) hours as needed. 10/18/15   Hope Bunnie Pion, NP  traZODone (DESYREL) 150 MG tablet Take 1 tablet (150 mg total) by mouth at bedtime as needed for sleep. 10/15/15   Cloria Spring, MD    ALLERGIES:  Allergies  Allergen Reactions  . Gabapentin Other (See Comments)    Pt states that she was unresponsive after taking this medication, but her vitals remained stable.    . Metoclopramide Hcl Anxiety and Other (See Comments)    Pt states that she felt like she was trapped in a box, and could not get out.  Pt also states that she had temporary loss of movement, weakness, and tingling.    . Codeine Nausea And Vomiting    Pt reports that she takes this med with no problems now EMO RN  . Zofran [Ondansetron] Other (See Comments)    Migraines   . Morphine And Related     Chest pain   . Latex Rash  . Tape Itching and Rash    Please use paper tape    SOCIAL HISTORY:  Social History  Substance Use Topics  . Smoking status: Former Smoker -- 0.25 packs/day for 6 years    Types: Cigarettes    Quit date: 08/10/2015  . Smokeless tobacco: Never Used  . Alcohol Use: 0.0 oz/week    0 Standard drinks or equivalent per week     Comment: once every weeks - maybe a beer with a dinner    FAMILY HISTORY: Family History  Problem Relation Age of Onset  . Hemochromatosis Maternal Grandmother   . Migraines Maternal Grandmother   . Cancer Maternal Grandmother   . Breast cancer Maternal Grandmother   . Hypertension Father   . Diabetes  Father   . Coronary artery disease Father   . Migraines Paternal Grandmother   . Breast cancer Paternal Grandmother   . Cancer Mother     breast  . Hemochromatosis Mother   . Breast cancer Mother   . Depression Mother   . Anxiety disorder Mother   . Coronary artery disease Paternal Grandfather   . Anxiety disorder Brother   . Bipolar disorder Brother     EXAM: BP 123/74 mmHg  Pulse 66  Temp(Src) 97.9 F (36.6 C) (Oral)  Resp 18  Ht $R'5\' 7"'iy$  (1.702 m)  Wt 200 lb (90.719 kg)  BMI 31.32 kg/m2  SpO2 98%  LMP 10/20/2015 CONSTITUTIONAL: Alert and oriented and responds appropriately to questions. Well-appearing; well-nourished; GCS [place] HEAD: Normocephalic; atraumatic EYES: Conjunctivae clear, PERRL, EOMI ENT: normal nose; no rhinorrhea; moist mucous membranes; pharynx without lesions noted; no dental injury; no septal hematoma NECK: Supple, no meningismus, no LAD; no midline spinal tenderness, step-off or deformity CARD: RRR; S1 and S2 appreciated; no murmurs, no clicks, no rubs, no gallops RESP: Normal chest excursion without splinting or tachypnea; breath sounds clear and equal bilaterally; no wheezes, no rhonchi, no rales; no hypoxia or respiratory distress CHEST:  chest wall stable, no crepitus or ecchymosis or deformity, nontender to palpation ABD/GI: Normal bowel sounds; non-distended; soft, non-tender, no rebound, no guarding PELVIS:  stable, nontender to palpation BACK:  The back appears normal and reports that she is having tenderness throughout the lower lumbar spine without step-off or deformity, there is no CVA tenderness EXT: Patient jumps when you barely touch her left ankle, left hip and lower back. She has 2+ DP pulse in the left foot. Extremity is warm and well-perfused. Patient will not move this leg as she reports she is having pain. Otherwise Normal ROM in all joints; otherwise extremities are non-tender to palpation; no edema; normal capillary refill; no cyanosis, no  bony tenderness or bony deformity of patient's extremities, no joint effusion, no ecchymosis or lacerations; I am unable to test ligamentous laxity in her ankle because she jerks or ankle away from me when I barely touch her foot    SKIN: Normal color for age and race; warm, no erythema or warmth, no rash NEURO: Moves all extremities equally, sensation to light touch intact diffusely PSYCH: Patient very agitated, angry when she was informed that she would not be receiving narcotics.  MEDICAL DECISION MAKING: Patient here with complains of an alleged fall with repeat left ankle pain, left hip pain and lower back pain. Neurovascularly intact on exam. She is very well-appearing and has no obvious sign of trauma. She does have a history of opiate abuse and I suspect there is a component of drug-seeking behavior. Patient is frequently in the emergency department 3-4 times every month for different painful conditions. She was seen by her primary care doctor on November 9 and per his note she was given hydrocodone. During her last emergency department visit on November 12 she was given tramadol. She states neither of these pain medications are helping.  She reports she is out of these medications. Per her recent primary care physician's note on 10/15/15 he states that he does not feel she needs stronger pain medication than the hydrocodone he provided her and does not want her to be on ongoing pain medication. I discussed with patient and her mother at bedside at length that I feel that putting her back on narcotics could be life-threatening for her given her prior history of abuse. When asked to palpation and she is not receiving narcotic she becomes very angry, begins raising her voice. When I offer her tylenol, ibuprofen, Toradol or other anti-inflammatories she states "what's the point?". 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 It appears per Dr. Lance Sell note they had  recommended this if patient's pain continued. I discussed with patient and mother that this type of testing as well as MRI which may be more helpful for patient's chronic pain would be done as an outpatient and can be ordered by her PCP. I do not feel these tests need to be done emergently.   ED PROGRESS:  Pt's xrays show no acute injury. No fracture or dislocation. Have offered pain control other than narcotics several times which patient continually refuses.  When I try to discuss the plan of care with her she raises her voice and talks over me. Her mother is calm and seems to understand that I recommend outpatient follow-up, rest, elevation and ice.  She was given an ASO brace her last ED visit. Have discussed with patient again that I feel that narcotics could be dangerous for her and I do not feel this time they are clinically indicated.  Have also recommended that she follow-up with a pain management clinic. When this is suggested she states "they want $500 upfront". Have also recommend she follow up with her PCP to discuss other pain management options such as Neurontin, Lyrica. She states "a doctor almost overdosed and killed me on Neurontin". She has multiple different excuses of why nothing will work for her other than narcotics. I will send a note to her primary care physician giving my strong concerns for her drug-seeking behavior. Discussed again with patient and her mother at length that I feel that opiates could be very dangerous for her given her history of narcotic abuse in the past and recommended she avoid them at all costs unless there is an objective finding on her exam or workup were narcotics would be indicated. Discussed return precautions and recommended outpatient follow-up. She verbalized understanding and is comfortable with plan.     Barrett, DO 10/20/15 0618   X-ray show  patient is constipated. Have recommend Colace 100 mg twice a day and MiraLAX once a day.  Bland, DO 10/20/15 [phone]

## 2015-10-20 NOTE — Telephone Encounter (Signed)
Pt was seen in ER Friday night, was told by the ER to call here And let the doc know she had fell an to check the ER Images   She would like to know how you would like to proceed

## 2015-10-21 ENCOUNTER — Telehealth: Payer: Self-pay | Admitting: Family Medicine

## 2015-10-21 MED ORDER — TRAMADOL HCL 50 MG PO TABS: ORAL_TABLET | ORAL | Status: DC

## 2015-10-21 NOTE — Telephone Encounter (Signed)
I would recommend using tramadol. If patient needs more then that would be fine. No greater than 1 every 8 hours when necessary pain, can prescribed #30, 1 refill

## 2015-10-21 NOTE — Telephone Encounter (Signed)
Referral to orthopedics would be fine.

## 2015-10-21 NOTE — Addendum Note (Signed)
Addended by: Ofilia Neas R on: 10/21/2015 05:00 PM   Modules accepted: Orders

## 2015-10-21 NOTE — Telephone Encounter (Signed)
Referral ordered in Epic per Dr.Scott Luking for orthopedics.

## 2015-10-21 NOTE — Telephone Encounter (Signed)
Spoke with patient and informed her per Dr.Scott Luking-X-rays are negative. If needing ongoing further input next step would be referral to orthopedics if interested. Patient verbalized understanding and stated that if that's what recommended then she would like the referral. May I proceed with referral?

## 2015-10-21 NOTE — Telephone Encounter (Signed)
Patient called requesting something to help with pain until she can be seen with ortho. Patient states that she does not want narcotics but would like something called in today to help with pain.

## 2015-10-21 NOTE — Telephone Encounter (Signed)
Called patient and informed her per Dr.Scott Luking- Dr.Scott recommends using tramadol. If patient needs more then that will be fine.No greater than 1 every 8 hours when necessary. Patient verbalized understanding #30 with one refill sent into pharmacy.

## 2015-10-28 ENCOUNTER — Encounter: Payer: Self-pay | Admitting: Family Medicine

## 2015-11-03 ENCOUNTER — Telehealth: Payer: Self-pay | Admitting: Family Medicine

## 2015-11-03 MED ORDER — TRAMADOL HCL 50 MG PO TABS: ORAL_TABLET | ORAL | Status: DC

## 2015-11-03 NOTE — Telephone Encounter (Signed)
Discussed with pt. She wants to know if she can have refill on tramadol. Sees dr Aline Brochure on the 15th

## 2015-11-03 NOTE — Telephone Encounter (Signed)
Tramadol is as strong as I'm willing to do

## 2015-11-03 NOTE — Telephone Encounter (Signed)
Pt calling to say she has some bruising still, still in pain with it  An don't see the orthopedic till the 15th to be seen. Wants to know If you can give her some pain meds for it till she can be seen. She  Has been taking ibuprofen, tylenol for the pain and still wearing  Her ankle brace.   Please advise

## 2015-11-03 NOTE — Telephone Encounter (Signed)
[facility] (vm box full) 

## 2015-11-03 NOTE — Telephone Encounter (Signed)
Faxed to pharmacy

## 2015-11-03 NOTE — Telephone Encounter (Signed)
Refill on tramadol

## 2015-11-12 ENCOUNTER — Ambulatory Visit (HOSPITAL_COMMUNITY): Payer: Medicaid Other | Admitting: Psychiatry

## 2015-11-15 ENCOUNTER — Emergency Department (HOSPITAL_COMMUNITY): Payer: Medicaid Other

## 2015-11-15 ENCOUNTER — Encounter (HOSPITAL_COMMUNITY): Payer: Self-pay | Admitting: Emergency Medicine

## 2015-11-15 ENCOUNTER — Emergency Department (HOSPITAL_COMMUNITY)
Admission: EM | Admit: 2015-11-15 | Discharge: 2015-11-15 | Disposition: A | Discharge status: Home / Self Care | Payer: Medicaid Other | Attending: Emergency Medicine | Admitting: Emergency Medicine

## 2015-11-15 DIAGNOSIS — R011 Cardiac murmur, unspecified: Secondary | ICD-10-CM | POA: Diagnosis not present

## 2015-11-15 DIAGNOSIS — G8929 Other chronic pain: Secondary | ICD-10-CM | POA: Diagnosis not present

## 2015-11-15 DIAGNOSIS — W19XXXA Unspecified fall, initial encounter: Secondary | ICD-10-CM

## 2015-11-15 DIAGNOSIS — R200 Anesthesia of skin: Secondary | ICD-10-CM | POA: Diagnosis not present

## 2015-11-15 DIAGNOSIS — F329 Major depressive disorder, single episode, unspecified: Secondary | ICD-10-CM | POA: Diagnosis not present

## 2015-11-15 DIAGNOSIS — Z862 Personal history of diseases of the blood and blood-forming organs and certain disorders involving the immune mechanism: Secondary | ICD-10-CM | POA: Diagnosis not present

## 2015-11-15 DIAGNOSIS — S0990XA Unspecified injury of head, initial encounter: Secondary | ICD-10-CM | POA: Diagnosis not present

## 2015-11-15 DIAGNOSIS — Y9389 Activity, other specified: Secondary | ICD-10-CM | POA: Insufficient documentation

## 2015-11-15 DIAGNOSIS — Z8742 Personal history of other diseases of the female genital tract: Secondary | ICD-10-CM | POA: Diagnosis not present

## 2015-11-15 DIAGNOSIS — W01198A Fall on same level from slipping, tripping and stumbling with subsequent striking against other object, initial encounter: Secondary | ICD-10-CM | POA: Insufficient documentation

## 2015-11-15 DIAGNOSIS — M797 Fibromyalgia: Secondary | ICD-10-CM | POA: Insufficient documentation

## 2015-11-15 DIAGNOSIS — F909 Attention-deficit hyperactivity disorder, unspecified type: Secondary | ICD-10-CM | POA: Diagnosis not present

## 2015-11-15 DIAGNOSIS — Z91040 Latex allergy status: Secondary | ICD-10-CM | POA: Insufficient documentation

## 2015-11-15 DIAGNOSIS — E669 Obesity, unspecified: Secondary | ICD-10-CM | POA: Diagnosis not present

## 2015-11-15 DIAGNOSIS — Z87891 Personal history of nicotine dependence: Secondary | ICD-10-CM | POA: Insufficient documentation

## 2015-11-15 DIAGNOSIS — R569 Unspecified convulsions: Secondary | ICD-10-CM | POA: Insufficient documentation

## 2015-11-15 DIAGNOSIS — Y998 Other external cause status: Secondary | ICD-10-CM | POA: Diagnosis not present

## 2015-11-15 DIAGNOSIS — Z79899 Other long term (current) drug therapy: Secondary | ICD-10-CM | POA: Insufficient documentation

## 2015-11-15 DIAGNOSIS — Y9289 Other specified places as the place of occurrence of the external cause: Secondary | ICD-10-CM | POA: Diagnosis not present

## 2015-11-15 LAB — CBC WITH DIFFERENTIAL/PLATELET
BASOS ABS: 0 10*3/uL (ref 0.0–0.1)
Basophils Relative: 0 %
Eosinophils Absolute: 0.1 10*3/uL (ref 0.0–0.7)
Eosinophils Relative: 1 %
HEMATOCRIT: 34.3 % — AB (ref 36.0–46.0)
HEMOGLOBIN: 10.6 g/dL — AB (ref 12.0–15.0)
LYMPHS ABS: 2.9 10*3/uL (ref 0.7–4.0)
LYMPHS PCT: 37 %
MCH: 27.5 pg (ref 26.0–34.0)
MCHC: 30.9 g/dL (ref 30.0–36.0)
MCV: 89.1 fL (ref 78.0–100.0)
Monocytes Absolute: 0.6 10*3/uL (ref 0.1–1.0)
Monocytes Relative: 8 %
NEUTROS ABS: 4.2 10*3/uL (ref 1.7–7.7)
NEUTROS PCT: 54 %
PLATELETS: 363 10*3/uL (ref 150–400)
RBC: 3.85 MIL/uL — AB (ref 3.87–5.11)
RDW: 16.7 % — ABNORMAL HIGH (ref 11.5–15.5)
WBC: 7.9 10*3/uL (ref 4.0–10.5)

## 2015-11-15 LAB — COMPREHENSIVE METABOLIC PANEL
ALK PHOS: 98 U/L (ref 38–126)
ALT: 47 U/L (ref 14–54)
AST: 45 U/L — AB (ref 15–41)
Albumin: 4.9 g/dL (ref 3.5–5.0)
Anion gap: 12 (ref 5–15)
BILIRUBIN TOTAL: 0.9 mg/dL (ref 0.3–1.2)
BUN: 13 mg/dL (ref 6–20)
CALCIUM: 9.1 mg/dL (ref 8.9–10.3)
CHLORIDE: 106 mmol/L (ref 101–111)
CO2: 21 mmol/L — ABNORMAL LOW (ref 22–32)
CREATININE: 0.69 mg/dL (ref 0.44–1.00)
Glucose, Bld: 92 mg/dL (ref 65–99)
Potassium: 3.6 mmol/L (ref 3.5–5.1)
Sodium: 139 mmol/L (ref 135–145)
Total Protein: 7.9 g/dL (ref 6.5–8.1)

## 2015-11-15 MED ORDER — DIPHENHYDRAMINE HCL 50 MG/ML IJ SOLN
25.0000 mg | Freq: Once | INTRAMUSCULAR | Status: AC
  Administered 2015-11-15: 25 mg via INTRAVENOUS
  Filled 2015-11-15: qty 1

## 2015-11-15 MED ORDER — PROCHLORPERAZINE EDISYLATE 5 MG/ML IJ SOLN
10.0000 mg | Freq: Once | INTRAMUSCULAR | Status: AC
  Administered 2015-11-15: 10 mg via INTRAVENOUS
  Filled 2015-11-15: qty 2

## 2015-11-15 MED ORDER — SODIUM CHLORIDE 0.9 % IV BOLUS (SEPSIS)
1000.0000 mL | Freq: Once | INTRAVENOUS | Status: AC
  Administered 2015-11-15: 1000 mL via INTRAVENOUS

## 2015-11-15 MED ORDER — DIAZEPAM 5 MG/ML IJ SOLN
2.5000 mg | Freq: Once | INTRAMUSCULAR | Status: AC
  Administered 2015-11-15: 2.5 mg via INTRAVENOUS
  Filled 2015-11-15: qty 2

## 2015-11-15 MED ORDER — METHYLPREDNISOLONE SODIUM SUCC 125 MG IJ SOLR
125.0000 mg | Freq: Once | INTRAMUSCULAR | Status: AC
  Administered 2015-11-15: 125 mg via INTRAVENOUS
  Filled 2015-11-15: qty 2

## 2015-11-15 NOTE — ED Notes (Signed)
MD at bedside. 

## 2015-11-15 NOTE — ED Notes (Signed)
Pt was being brought back to ED room and pt was having seizure during transport on wheelchair. Pt has now had 3 seizure since she has been in ED room, each one lasting approximately 1 minute. Pt's HR remains stable during each seizure, but O2 sat does drop into low 80's.

## 2015-11-15 NOTE — ED Provider Notes (Signed)
CSN: [id number]     Arrival date & time 11/15/15  1404 History   First MD Initiated Contact with Patient 11/15/15 1547     Chief Complaint  Patient presents with  . Fall     (Consider location/radiation/quality/duration/timing/severity/associated sxs/prior Treatment) Patient is a 29 y.o. female presenting with seizures.  Seizures Seizure activity on arrival: no   Initial focality:  None Episode characteristics: no abnormal movements and no apnea   Postictal symptoms: no confusion   Return to baseline: yes   Severity:  Mild Duration:  2 minutes Timing:  Once Progression:  Worsening Context: not cerebral palsy and not drug use     Past Medical History  Diagnosis Date  . Migraines   . Interstitial cystitis   . IUD FEB 2010  . Gastritis JULY 2011  . Depression   . 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 Fatty liver   . Obesity (BMI 30-39.9) 2011 228 LBS BMI 36.8  . Iron deficiency anemia 07/23/2010  . Irritable bowel syndrome 2012 DIARRHEA    JUN 2012 TTG IgA 14.9  . Elevated liver enzymes JUL 2011ALK PHOS 111-127 AST  143-267 ALT  213-321T BILI 0.6  ALB  3.7-4.06 Jun 2011 ALK PHOS 118 AST 24 ALT 42 T BILI 0.4 ALB 3.9  . Chronic daily headache   . Ovarian cyst   . ADD (attention deficit disorder)   . RLQ abdominal pain 07/18/2013  . Menorrhagia 07/18/2013  . Anxiety   . Potassium (K) deficiency   . Dysmenorrhea 09/18/2013  . Stress 09/18/2013  . Patient desires pregnancy 09/18/2013  . Pregnant 12/25/2013  . Blood transfusion without reported diagnosis   . Heart murmur   . Gastric bypass status for obesity   . 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 Dysrhythmia   . Sciatica of left side 11/14/2014  . Fibromyalgia   . 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 PONV (postoperative nausea and vomiting) seizure post-operatively  . Psychiatric pseudoseizure   . Depression    Past Surgical History  Procedure Laterality Date  . Gab  2007     in [place]-POUCH 5 CM  . Cholecystectomy  2005    biliary dyskinesia  . Cath self every nite      for sodium bicarb injection (discontinued 2013)  . Colonoscopy  JUN 2012 ABD PN/DIARRHEA WITH PROPOFOL    NL COLON  . Upper gastrointestinal endoscopy  JULY 2011 NAUSEA-D125,V6, PH 25    Bx; GASTRITIS, POUCH-5 CM LONG  . Dilation and curettage of uterus    . Tonsillectomy    . Gastric bypass  06/2006  . Savory dilation  06/20/2012    Dr. Barnie Alderman gastritis/Ulcer in the mid jejunum. Empiric dilation.   . Tonsillectomy and adenoidectomy    . Esophagogastroduodenoscopy    . Wisdom tooth extraction    . Repair vaginal cuff N/A 07/30/2014    Procedure: REPAIR VAGINAL CUFF;  Surgeon: Mora Bellman, MD;  Location: [facility];  Service: Gynecology;  Laterality: N/A;  . Hysteroscopy w/d&c N/A 09/12/2014    Procedure: DILATATION AND CURETTAGE /HYSTEROSCOPY;  Surgeon: Jonnie Kind, MD;  Location: [facility];  Service: Gynecology;  Laterality: N/A;  . Esophagogastroduodenoscopy (egd) with propofol N/A 04/10/2015    Procedure: ESOPHAGOGASTRODUODENOSCOPY (EGD) WITH PROPOFOL;  Surgeon: Daneil Dolin, MD;  Location: [facility];  Service: Endoscopy;  Laterality: N/A;  . Esophageal dilation N/A 04/10/2015    Procedure: ESOPHAGEAL  DILATION WITH 54FR J[place];  Surgeon: Daneil Dolin, MD;  Location: [facility];  Service: Endoscopy;  Laterality: N/A;  . Esophageal biopsy  04/10/2015    Procedure: BIOPSY;  Surgeon: Daneil Dolin, MD;  Location: [facility];  Service: Endoscopy;;   Family History  Problem Relation Age of Onset  . Hemochromatosis Maternal Grandmother   . Migraines Maternal Grandmother   . Cancer Maternal Grandmother   . Breast cancer Maternal Grandmother   . Hypertension Father   . Diabetes Father   . Coronary artery disease Father   . Migraines Paternal Grandmother   . Breast cancer Paternal Grandmother   . Cancer Mother     breast  . Hemochromatosis Mother   . Breast cancer Mother   .  Depression Mother   . Anxiety disorder Mother   . Coronary artery disease Paternal Grandfather   . Anxiety disorder Brother   . Bipolar disorder Brother    Social History  Substance Use Topics  . Smoking status: Former Smoker -- 0.25 packs/day for 6 years    Types: Cigarettes    Quit date: 08/10/2015  . Smokeless tobacco: Never Used  . Alcohol Use: 0.0 oz/week    0 Standard drinks or equivalent per week     Comment: once every weeks - maybe a beer with a dinner   OB History    Gravida Para Term Preterm AB TAB SAB Ectopic Multiple Living   '3 2 1 1 1  1   2     '$ Review of Systems  Constitutional: Negative for fever, chills and fatigue.  HENT: Negative for congestion and ear pain.   Eyes: Negative for pain and itching.  Respiratory: Negative for cough and shortness of breath.   Cardiovascular: Negative for chest pain and palpitations.  Genitourinary: Negative for urgency and decreased urine volume.  Neurological: Positive for seizures.  All other systems reviewed and are negative.     Allergies  Gabapentin; Metoclopramide hcl; Codeine; Zofran; Morphine and related; Latex; and Tape  Home Medications   Prior to Admission medications   Medication Sig Start Date End Date Taking? Authorizing Provider  APAP-Pamabrom-Pyrilamine (MENSTRUAL PAIN RELIEF) 500-25-15 MG TABS Take 1-2 tablets by mouth daily as needed (for pain).   Yes Historical Provider, MD  clonazePAM (KLONOPIN) 0.5 MG tablet Take 1 tablet (0.5 mg total) by mouth 3 (three) times daily as needed for anxiety. 10/17/15 10/16/16 Yes Cloria Spring, MD  DULoxetine (CYMBALTA) 60 MG capsule Take 1 capsule (60 mg total) by mouth daily. 10/15/15  Yes Cloria Spring, MD  phenazopyridine (PYRIDIUM) 200 MG tablet Take 1 tablet (200 mg total) by mouth 3 (three) times daily with meals. X 7 days Patient taking differently: Take 200 mg by mouth 3 (three) times daily as needed for pain.  09/18/15  Yes Kathyrn Drown, MD  tiZANidine  (ZANAFLEX) 4 MG tablet Take 1 tablet (4 mg total) by mouth 3 (three) times daily as needed for muscle spasms. 07/21/15  Yes Kathyrn Drown, MD  topiramate (TOPAMAX) 50 MG tablet Take 1 tablet in the morning and 2 tablets in the evening as needed for migraines. 09/25/15  Yes Kathyrn Drown, MD  traMADol (ULTRAM) 50 MG tablet Take 1 tablet every 8 hours as needed for pain. Patient taking differently: Take 50 mg by mouth every 8 (eight) hours as needed. Take 1 tablet every 8 hours as needed for pain. 11/03/15  Yes Kathyrn Drown, MD  amphetamine-dextroamphetamine (ADDERALL XR)  30 MG 24 hr capsule Take 1 capsule (30 mg total) by mouth daily. 10/15/15   Cloria Spring, MD  amphetamine-dextroamphetamine (ADDERALL XR) 30 MG 24 hr capsule Take 1 capsule (30 mg total) by mouth daily. 10/15/15   Cloria Spring, MD  carbamazepine (TEGRETOL) 200 MG tablet Take two tablets at bedtime Patient taking differently: Take 400 mg by mouth at bedtime. Take two tablets at bedtime 10/15/15   Cloria Spring, MD  doxycycline (VIBRAMYCIN) 100 MG capsule Take 1 capsule (100 mg total) by mouth 2 (two) times daily. Patient not taking: Reported on 11/15/2015 10/07/15   Kathyrn Drown, MD  fluconazole (DIFLUCAN) 150 MG tablet Take 1 tablet (150 mg total) by mouth once. Patient not taking: Reported on 11/15/2015 10/07/15   Kathyrn Drown, MD  ibuprofen (ADVIL,MOTRIN) 800 MG tablet Take 1 tablet (800 mg total) by mouth every 8 (eight) hours as needed for mild pain. 10/20/15   Kristen N Ward, DO  naproxen (NAPROSYN) 500 MG tablet Take 1 tablet (500 mg total) by mouth 2 (two) times daily. 10/18/15   Hope Bunnie Pion, NP  promethazine (PHENERGAN) 25 MG tablet Take 1 tablet (25 mg total) by mouth every 6 (six) hours as needed for nausea or vomiting. 06/11/15   Ezequiel Essex, MD  traZODone (DESYREL) 150 MG tablet Take 1 tablet (150 mg total) by mouth at bedtime as needed for sleep. 10/15/15   Cloria Spring, MD   BP 115/72 mmHg  Pulse 60   Temp(Src) 98.5 F (36.9 C) (Oral)  Resp 15  Ht $R'5\' 6"'ub$  (1.676 m)  SpO2 95%  LMP 11/13/2015 Physical Exam  Constitutional: She appears well-developed and well-nourished.  HENT:  Head: Normocephalic and atraumatic.  Neck: Normal range of motion.  Cardiovascular: Normal rate and regular rhythm.   Pulmonary/Chest: No stridor. No respiratory distress.  Abdominal: She exhibits no distension.  Neurological: She is alert.  No altered mental status, able to give full seemingly accurate history.  Face is symmetric, EOM's intact, pupils equal and reactive, vision intact, tongue and uvula midline without deviation Upper and Lower extremity motor 5/5, intact pain perception in distal extremities but decreased light touch in LLE (baseline), 2+ reflexes in biceps, patella and achilles tendons. Finger to nose normal, heel to shin normal. Walks without assistance or evident ataxia.   Nursing note and vitals reviewed.   ED Course  Procedures (including critical care time) Labs Review Labs Reviewed  CBC WITH DIFFERENTIAL/PLATELET - Abnormal; Notable for the following:    RBC 3.85 (*)    Hemoglobin 10.6 (*)    HCT 34.3 (*)    RDW 16.7 (*)    All other components within normal limits  COMPREHENSIVE METABOLIC PANEL - Abnormal; Notable for the following:    CO2 21 (*)    AST 45 (*)    All other components within normal limits    Imaging Review Ct Head Wo Contrast  11/15/2015  CLINICAL DATA:  Fall, hit head on dresser history of seizures EXAM: CT HEAD WITHOUT CONTRAST TECHNIQUE: Contiguous axial images were obtained from the base of the skull through the vertex without intravenous contrast. COMPARISON:  05/09/2015 FINDINGS: No skull fracture is noted. Again noted mild mucosal thickening and tiny air-fluid level in right maxillary sinus. The mastoid air cells are unremarkable. No intracranial hemorrhage, mass effect or midline shift. No acute cortical infarction. No mass lesion is noted on this  unenhanced scan. There are motion artifacts images of the high convexity  vertex. No hydrocephalus. IMPRESSION: No acute intracranial abnormality. Motion artifacts are noted last images high convexity vertex. Again noticed small mucosal thickening in right maxillary sinus. Electronically Signed   By: Lahoma Crocker M.D.   On: 11/15/2015 17:34   I have personally reviewed and evaluated these images and lab results as part of my medical decision-making.   EKG Interpretation   Date/Time:  Saturday November 15 2015 15:47:57 EST Ventricular Rate:  61 PR Interval:  163 QRS Duration: 83 QT Interval:  434 QTC Calculation: 437 R Axis:   57 Text Interpretation:  Sinus rhythm ED PHYSICIAN INTERPRETATION AVAILABLE  IN [facility] [place] Confirmed by TEST, Record ([id number]) on 11/16/2015 7:38:00  AM       My ECG Read Indication: seizure EKG was personally contemporaneously reviewed by myself. Rate: 61 PR Interval: 163 QRS duration: 83 QT/QTC: 434/437 Axis: normal EKG: normal EKG, normal sinus rhythm, unchanged from previous tracings. Other significant findings: none  MDM   Final diagnoses:  Fall, initial encounter   Mechanical fall today and subsequent seizure as well. H/o pseudo seizure. On evaluation, witnessed an episode and did not appear epileptic in nature. Ct negative. Rest of labs ok. LLE numbness is old, has fu already scheduled. Been at baseline, improved headache over last couple hours. Stable for dc to care of husband.     Merrily Pew, MD 11/16/15 1758

## 2015-11-15 NOTE — ED Notes (Signed)
Patient needs to provide urine for labs . Patient is sleeping and can not be aroused to use bed pan.

## 2015-11-15 NOTE — ED Notes (Signed)
EMS reports family stated pt fell this am and hit her head on the corner of her dresser. PT states he left leg has numbness and caused her fall and is seeing a neurologist this coming Tuesday for stated complaint. PT leaning in wheelchair on arrival to ED but can answer questions.

## 2015-11-15 NOTE — ED Notes (Signed)
Discharge instructions given, pt demonstrated teach back and verbal understanding. Not happy with lack of narcotic pain medication.

## 2015-11-17 ENCOUNTER — Other Ambulatory Visit: Payer: Self-pay | Admitting: Family Medicine

## 2015-11-19 ENCOUNTER — Ambulatory Visit (INDEPENDENT_AMBULATORY_CARE_PROVIDER_SITE_OTHER): Payer: Medicaid Other | Admitting: Orthopedic Surgery

## 2015-11-19 ENCOUNTER — Encounter: Payer: Self-pay | Admitting: Orthopedic Surgery

## 2015-11-19 ENCOUNTER — Telehealth: Payer: Self-pay | Admitting: *Deleted

## 2015-11-19 VITALS — BP 121/80 | Ht 67.0 in | Wt 200.0 lb

## 2015-11-19 DIAGNOSIS — R29898 Other symptoms and signs involving the musculoskeletal system: Secondary | ICD-10-CM | POA: Diagnosis not present

## 2015-11-19 NOTE — Patient Instructions (Signed)
Dr Aline Brochure will refer her back to Dr. Wolfgang Phoenix to be seen by a back specialist and a Neurologist.

## 2015-11-19 NOTE — Progress Notes (Signed)
Patient ID: Penny [name], female   DOB: August 17, 1986, 29 y.o.   MRN: [id number]  Chief Complaint  Patient presents with  . Ankle Injury    Left ankle injury s/p fall in Oct, referred by S. Luking    HPI Penny [name] is a 29 y.o. female.  She presents for eval of left ankle after fall in Oct 2016 however her symptoms do not involve the left ankle. She complains of frequent falls another fall in the last 10 days and pain swelling stiffness giving way numbness and tingling radiating down her left leg which is described as sharp stabbing aching radiating pain and it's constant it 7 out of 10 its unrelieved by over-the-counter medications  After her baby was born 68 months ago she had persistent problems after a epidural that required 2 attempts and was not very successful she also had a seizure and had a normal CT scan  Her review of systems revealed numbness tingling burning pain in her legs with dizziness and weakness back pain and muscle weakness she also has frequent urination loss of bladder control she has abdominal pain depression anxiety irregular heartbeat and ankle leg swelling  She's had an MRI on 2 occasions and she seems to have disc disease which involves the right side not the left all symptoms have always been on the left  Review of Systems Review of Systems  Past Medical History  Diagnosis Date  . Migraines   . Interstitial cystitis   . IUD FEB 2010  . Gastritis JULY 2011  . Depression   . 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 Fatty liver   . Obesity (BMI 30-39.9) 2011 228 LBS BMI 36.8  . Iron deficiency anemia 07/23/2010  . Irritable bowel syndrome 2012 DIARRHEA    JUN 2012 TTG IgA 14.9  . Elevated liver enzymes JUL 2011ALK PHOS 111-127 AST  143-267 ALT  213-321T BILI 0.6  ALB  3.7-4.06 Jun 2011 ALK PHOS 118 AST 24 ALT 42 T BILI 0.4 ALB 3.9  . Chronic daily headache   . Ovarian cyst   . ADD (attention deficit disorder)   . RLQ abdominal pain 07/18/2013  .  Menorrhagia 07/18/2013  . Anxiety   . Potassium (K) deficiency   . Dysmenorrhea 09/18/2013  . Stress 09/18/2013  . Patient desires pregnancy 09/18/2013  . Pregnant 12/25/2013  . Blood transfusion without reported diagnosis   . Heart murmur   . Gastric bypass status for obesity   . 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 Dysrhythmia   . Sciatica of left side 11/14/2014  . Fibromyalgia   . 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 PONV (postoperative nausea and vomiting) seizure post-operatively  . Psychiatric pseudoseizure   . Depression     Past Surgical History  Procedure Laterality Date  . Gab  2007    in [place]-POUCH 5 CM  . Cholecystectomy  2005    biliary dyskinesia  . Cath self every nite      for sodium bicarb injection (discontinued 2013)  . Colonoscopy  JUN 2012 ABD PN/DIARRHEA WITH PROPOFOL    NL COLON  . Upper gastrointestinal endoscopy  JULY 2011 NAUSEA-D125,V6, PH 25    Bx; GASTRITIS, POUCH-5 CM LONG  . Dilation and curettage of uterus    . Tonsillectomy    . Gastric bypass  06/2006  . Savory dilation  06/20/2012    Dr. Barnie Alderman gastritis/Ulcer  in the mid jejunum. Empiric dilation.   . Tonsillectomy and adenoidectomy    . Esophagogastroduodenoscopy    . Wisdom tooth extraction    . Repair vaginal cuff N/A 07/30/2014    Procedure: REPAIR VAGINAL CUFF;  Surgeon: Mora Bellman, MD;  Location: [facility];  Service: Gynecology;  Laterality: N/A;  . Hysteroscopy w/d&c N/A 09/12/2014    Procedure: DILATATION AND CURETTAGE /HYSTEROSCOPY;  Surgeon: Jonnie Kind, MD;  Location: [facility];  Service: Gynecology;  Laterality: N/A;  . Esophagogastroduodenoscopy (egd) with propofol N/A 04/10/2015    Procedure: ESOPHAGOGASTRODUODENOSCOPY (EGD) WITH PROPOFOL;  Surgeon: Daneil Dolin, MD;  Location: [facility];  Service: Endoscopy;  Laterality: N/A;  . Esophageal dilation N/A 04/10/2015    Procedure: ESOPHAGEAL DILATION WITH 54FR MALONEY DILATOR;  Surgeon:  Daneil Dolin, MD;  Location: [facility];  Service: Endoscopy;  Laterality: N/A;  . Esophageal biopsy  04/10/2015    Procedure: BIOPSY;  Surgeon: Daneil Dolin, MD;  Location: [facility];  Service: Endoscopy;;    Family History  Problem Relation Age of Onset  . Hemochromatosis Maternal Grandmother   . Migraines Maternal Grandmother   . Cancer Maternal Grandmother   . Breast cancer Maternal Grandmother   . Hypertension Father   . Diabetes Father   . Coronary artery disease Father   . Migraines Paternal Grandmother   . Breast cancer Paternal Grandmother   . Cancer Mother     breast  . Hemochromatosis Mother   . Breast cancer Mother   . Depression Mother   . Anxiety disorder Mother   . Coronary artery disease Paternal Grandfather   . Anxiety disorder Brother   . Bipolar disorder Brother     Social History Social History  Substance Use Topics  . Smoking status: Former Smoker -- 0.25 packs/day for 6 years    Types: Cigarettes    Quit date: 08/10/2015  . Smokeless tobacco: Never Used  . Alcohol Use: 0.0 oz/week    0 Standard drinks or equivalent per week     Comment: once every weeks - maybe a beer with a dinner    Allergies  Allergen Reactions  . Gabapentin Other (See Comments)    Pt states that she was unresponsive after taking this medication, but her vitals remained stable.    . Metoclopramide Hcl Anxiety and Other (See Comments)    Pt states that she felt like she was trapped in a box, and could not get out.  Pt also states that she had temporary loss of movement, weakness, and tingling.    . Codeine Nausea And Vomiting    Pt reports that she takes this med with no problems now EMO RN  . Zofran [Ondansetron] Other (See Comments)    Migraines   . Morphine And Related     Chest pain   . Latex Rash  . Tape Itching and Rash    Please use paper tape    Current Outpatient Prescriptions  Medication Sig Dispense Refill  . amphetamine-dextroamphetamine (ADDERALL XR) 30 MG 24  hr capsule Take 1 capsule (30 mg total) by mouth daily. 30 capsule 0  . APAP-Pamabrom-Pyrilamine (MENSTRUAL PAIN RELIEF) 500-25-15 MG TABS Take 1-2 tablets by mouth daily as needed (for pain).    . carbamazepine (TEGRETOL) 200 MG tablet Take two tablets at bedtime (Patient taking differently: Take 400 mg by mouth at bedtime. Take two tablets at bedtime) 60 tablet 2  . clonazePAM (KLONOPIN) 0.5 MG tablet Take 1 tablet (0.5 mg  total) by mouth 3 (three) times daily as needed for anxiety. 90 tablet 2  . doxycycline (VIBRAMYCIN) 100 MG capsule Take 1 capsule (100 mg total) by mouth 2 (two) times daily. 20 capsule 0  . DULoxetine (CYMBALTA) 60 MG capsule Take 1 capsule (60 mg total) by mouth daily. 30 capsule 2  . fluconazole (DIFLUCAN) 150 MG tablet Take 1 tablet (150 mg total) by mouth once. 1 tablet 4  . ibuprofen (ADVIL,MOTRIN) 800 MG tablet Take 1 tablet (800 mg total) by mouth every 8 (eight) hours as needed for mild pain. 30 tablet 0  . naproxen (NAPROSYN) 500 MG tablet Take 1 tablet (500 mg total) by mouth 2 (two) times daily. 20 tablet 0  . phenazopyridine (PYRIDIUM) 200 MG tablet Take 1 tablet (200 mg total) by mouth 3 (three) times daily with meals. X 7 days 21 tablet 3  . promethazine (PHENERGAN) 25 MG tablet Take 1 tablet (25 mg total) by mouth every 6 (six) hours as needed for nausea or vomiting. 30 tablet 0  . tiZANidine (ZANAFLEX) 4 MG tablet TAKE ONE TABLET BY MOUTH THREE TIMES DAILY AS NEEDED FOR MUSCLE SPASMS. 90 tablet 0  . topiramate (TOPAMAX) 50 MG tablet Take 50 mg by mouth 2 (two) times daily. 53m in the am [id number]    . traZODone (DESYREL) 150 MG tablet Take 1 tablet (150 mg total) by mouth at bedtime as needed for sleep. 30 tablet 2   No current facility-administered medications for this visit.       Physical Exam Physical Exam Blood pressure 121/80, height _0  (1.702 m), weight 200 lb (90.719 kg), last menstrual period 11/13/2015, not currently  breastfeeding. Appearance, there are no abnormalities in terms of appearance the patient was well-developed and well-nourished. The grooming and hygiene were normal.  Mental status orientation, there was normal alertness and orientation Mood pleasant Ambulatory status abnormal requiring a walker  Examination of the left knee  Inspection no tenderness instability range of motion deficits or strength deficits in the quadriceps muscle. She has a foot drop partial 3 out of 5 strength on dorsiflexion 5 out of 5 on the right  No pulse abnormality extremities warm to touch The opposite extremity knee examination range of motion stability and strength normal    Data Reviewed MRI reports were reviewed summation she has disc disease January 2016 MRI L3-4: Small right central disc protrusion with annular fissure appears mildly larger/more focal than on the prior study. This approaches but does not clearly displace the traversing right L4 nerve root. No spinal stenosis or neural foraminal stenosis. Mild facet hypertrophy.  L4-5: Shallow right central disc protrusion with annular fissure is similar to the prior study, in close proximity to the traversing right L5 nerve root without clear impingement. No spinal canal or neural foraminal stenosis. Mild facet hypertrophy.  L5-S1: Mild facet hypertrophy. No disc herniation or stenosis.  IMPRESSION: 1. Small right central disc protrusion at L3-4, larger than on the prior study but without evidence of neural impingement. 2. Unchanged, shallow disc protrusion at L4-5 without stenosis.   Electronically Signed  By: AlLogan BoresOn: 12/30/2014 12:44 Assessment  She also has some type of lumbar disc problem causing nerve impingement on the left she has no instability in the ankle or knee and she would be better served with a neurosurgeon and a neurologist   Plan  I'm referring her back to her primary care doctor for appropriate  referrals to neurology and neurosurgery

## 2015-11-19 NOTE — Telephone Encounter (Signed)
DR Aline Brochure RECOMMENDS REFERRAL TO BACK SPECIALIST AND NEUROLOGY  PATIENTS INSURANCE REQUIRES REFERRAL FROM PCP  OFFICE NOTE AND RECOMMENDATION SENT TO DR Wolfgang Phoenix VIA [facility]

## 2015-11-20 ENCOUNTER — Ambulatory Visit: Payer: Self-pay | Admitting: Orthopedic Surgery

## 2015-11-20 ENCOUNTER — Other Ambulatory Visit: Payer: Self-pay

## 2015-11-20 ENCOUNTER — Encounter: Payer: Self-pay | Admitting: Family Medicine

## 2015-11-20 ENCOUNTER — Ambulatory Visit (INDEPENDENT_AMBULATORY_CARE_PROVIDER_SITE_OTHER): Payer: Medicaid Other | Admitting: Family Medicine

## 2015-11-20 ENCOUNTER — Telehealth: Payer: Self-pay

## 2015-11-20 VITALS — BP 102/70 | Temp 97.8°F | Ht 66.0 in

## 2015-11-20 DIAGNOSIS — R29898 Other symptoms and signs involving the musculoskeletal system: Secondary | ICD-10-CM

## 2015-11-20 MED ORDER — HYDROCODONE-ACETAMINOPHEN 5-325 MG PO TABS: 1.0000 | ORAL_TABLET | Freq: Four times a day (QID) | ORAL | Status: DC | PRN

## 2015-11-20 NOTE — Progress Notes (Signed)
   Subjective:    Patient ID: Penny [name], female    DOB: 10/26/1986, 29 y.o.   MRN: [id number]  HPI Patient is here today to follow up on a recent fall she had. Pain and numbness noted.  This patient relates that on Saturday morning she woke up in the lower portion of her leg would not work. She described as being very weak unable to stand on it when she tried to stand on it she fell. She has been to the ER with this she also saw orthopedics specialist who stated that this was not something that he felt was orthopedic. She presents here today because of this patient very frustrated she is scared. She relates she is having a lot of pain and she feels like nobody seems to understand what she is going through.  This patient also has a history of pseudoseizures. She has not had any recently. She is been under the counseling of psychiatry.  Review of Systems She denies headaches chills sweats she describes pain in her lower back she relates pain radiating down the left leg. She relates left lower leg weakness. Also mild URI symptoms head congestion coughing over the past few days    Objective:   Physical Exam Eardrums normal throat is normal neck no masses lungs are clear no crackles heart is regular lower back subjective tenderness reflexes slightly diminished in both knees. Ankle reflexes hard to elicit on the left side. Patient unable to plantar flex or dorsiflex on the left side patient unable to raise her big toe. Patient unable to walk on her heels or her toes on the left side because of the weakness.  No obvious swelling of the ankle or foot     Assessment & Plan:  Neurologic issue-this patient needs a neurologic consultation as well as nerve conduction study as well as EMG. Some of her physical exam does not make clinical sense in regards to physiologic findings the patient adamantly states that this issue is not because of psychiatric issues  Left lower leg weakness with left leg  sciatica and back discomfort. Patient on a previous MRI does have a small herniation. With her having severe weakness of the left lower leg and combination of foot drop and weakness I believe the patient would benefit from MRI of the lumbar spine to rule out pathology as the source of this issue. MRI ordered.  Pain control-I was very forward with the patient that I did not feel that she should be on pain medications long-term. Patient states that she is having severe pain with this issue. She was given a prescription for hydrocodone 5 mg/325 one 4 times a day when necessary severe pain caution drowsiness not for frequent use #40. Our office will not be prescribing pain medications long-term for this patient. The patient is uses pain medicine over the next few weeks as this issue is sorted out.  Patient unable to work currently because of the weakness having to use a walker to get around she was advised not to carry her children. Her mother is driving her.

## 2015-11-20 NOTE — Telephone Encounter (Signed)
-----   Message from Kathyrn Drown, MD sent at 11/20/2015 11:08 AM EST ----- Please set the patient up for lumbar MRI. Due to weakness. Documentation in the note.

## 2015-11-20 NOTE — Telephone Encounter (Signed)
MRI lumbar spine scheduled for Dec 05, 2015. Patient was notified.

## 2015-11-21 ENCOUNTER — Other Ambulatory Visit: Payer: Self-pay | Admitting: *Deleted

## 2015-11-21 ENCOUNTER — Telehealth: Payer: Self-pay | Admitting: Family Medicine

## 2015-11-21 DIAGNOSIS — R29898 Other symptoms and signs involving the musculoskeletal system: Secondary | ICD-10-CM

## 2015-11-21 NOTE — Telephone Encounter (Signed)
Pt is wanting a prescription sent in to [facility] for a walker that has a seat on it. Pt states that the walker that she has is making her arms go numb and is causing her to fall. Pt states that she wont be able to see the neurologist until the end of January. Please advise.

## 2015-11-21 NOTE — Telephone Encounter (Signed)
Called patient and informed her per Dr.Steve Luking-Let pt know it takes a while to get used to a walker, since dr scott just saw let pt know dr Nicki Reaper will see this call Monday. Patient verbalized understanding.

## 2015-11-21 NOTE — Telephone Encounter (Signed)
Let pt know it takes a while to get used to a walker, since dr scott just saw let pt know dr Nicki Reaper will see this call monday

## 2015-11-24 ENCOUNTER — Telehealth: Payer: Self-pay | Admitting: Family Medicine

## 2015-11-24 NOTE — Telephone Encounter (Signed)
Pt is wanting a prescription sent in to [facility] for a walker that has a seat on it. Pt states that the walker that she has is making her arms go numb and is causing her to fall. Pt states that she wont be able to see the neurologist until the end of January. Please advise.  This message was taken last week, but I am unable to add to last message because it was closed.  Also, patient wanted to add that her falls are becoming more frequent and numbness is going further up her legs about half way up her thigh.

## 2015-11-24 NOTE — Telephone Encounter (Signed)
actually-the patient has an appointment at the end of this month for MRI and an appointment on January 10 to see the neurologist.these appointments are the soonest that can be done. Make certain that she is aware of these appointments so she doesn't miss them. She may have a prescription for a walker with a seat on it. Also if her leg gets progressively more numb or weak the next step would be go to the emergency department either at [facility] or at [facility]

## 2015-11-24 NOTE — Telephone Encounter (Signed)
Rx faxed to pharmacy. Patient advised-the patient has an appointment at the end of this month for MRI and an appointment on January 10 to see the neurologist.these appointments are the soonest that can be done. Make certain that she is aware of these appointments so she doesn't miss them. She may have a prescription for a walker with a seat on it. Also if her leg gets progressively more numb or weak the next step would be go to the emergency department either at [facility] or at [facility]. Patient verbalized understanding.

## 2015-11-24 NOTE — Telephone Encounter (Signed)
Pt was told by nurse to go to ER bcz increased numbness,ptt refused

## 2015-11-25 ENCOUNTER — Ambulatory Visit (HOSPITAL_COMMUNITY): Payer: Self-pay | Admitting: Psychiatry

## 2015-11-25 ENCOUNTER — Encounter (HOSPITAL_COMMUNITY): Payer: Self-pay | Admitting: Psychiatry

## 2015-11-26 ENCOUNTER — Encounter (HOSPITAL_COMMUNITY): Payer: Self-pay | Admitting: *Deleted

## 2015-11-26 ENCOUNTER — Telehealth: Payer: Self-pay | Admitting: Family Medicine

## 2015-11-26 ENCOUNTER — Emergency Department (HOSPITAL_COMMUNITY): Payer: Medicaid Other

## 2015-11-26 ENCOUNTER — Inpatient Hospital Stay (HOSPITAL_COMMUNITY): Payer: Medicaid Other

## 2015-11-26 ENCOUNTER — Observation Stay (HOSPITAL_COMMUNITY)
Admission: EM | Admit: 2015-11-26 | Discharge: 2015-11-27 | Disposition: A | Discharge status: Home / Self Care | Payer: Medicaid Other | Attending: Internal Medicine | Admitting: Internal Medicine

## 2015-11-26 DIAGNOSIS — W1839XA Other fall on same level, initial encounter: Secondary | ICD-10-CM | POA: Insufficient documentation

## 2015-11-26 DIAGNOSIS — M797 Fibromyalgia: Secondary | ICD-10-CM | POA: Insufficient documentation

## 2015-11-26 DIAGNOSIS — F418 Other specified anxiety disorders: Secondary | ICD-10-CM | POA: Diagnosis present

## 2015-11-26 DIAGNOSIS — R200 Anesthesia of skin: Secondary | ICD-10-CM | POA: Diagnosis not present

## 2015-11-26 DIAGNOSIS — D509 Iron deficiency anemia, unspecified: Secondary | ICD-10-CM | POA: Diagnosis not present

## 2015-11-26 DIAGNOSIS — G930 Cerebral cysts: Secondary | ICD-10-CM | POA: Insufficient documentation

## 2015-11-26 DIAGNOSIS — R202 Paresthesia of skin: Secondary | ICD-10-CM | POA: Insufficient documentation

## 2015-11-26 DIAGNOSIS — Z87891 Personal history of nicotine dependence: Secondary | ICD-10-CM | POA: Diagnosis not present

## 2015-11-26 DIAGNOSIS — R531 Weakness: Secondary | ICD-10-CM | POA: Diagnosis not present

## 2015-11-26 DIAGNOSIS — F988 Other specified behavioral and emotional disorders with onset usually occurring in childhood and adolescence: Secondary | ICD-10-CM | POA: Diagnosis not present

## 2015-11-26 DIAGNOSIS — Z91040 Latex allergy status: Secondary | ICD-10-CM | POA: Insufficient documentation

## 2015-11-26 DIAGNOSIS — K589 Irritable bowel syndrome without diarrhea: Secondary | ICD-10-CM | POA: Diagnosis not present

## 2015-11-26 DIAGNOSIS — Y9389 Activity, other specified: Secondary | ICD-10-CM | POA: Insufficient documentation

## 2015-11-26 DIAGNOSIS — M25551 Pain in right hip: Secondary | ICD-10-CM | POA: Insufficient documentation

## 2015-11-26 DIAGNOSIS — R208 Other disturbances of skin sensation: Secondary | ICD-10-CM | POA: Diagnosis not present

## 2015-11-26 DIAGNOSIS — M5126 Other intervertebral disc displacement, lumbar region: Secondary | ICD-10-CM | POA: Insufficient documentation

## 2015-11-26 DIAGNOSIS — Y9289 Other specified places as the place of occurrence of the external cause: Secondary | ICD-10-CM | POA: Diagnosis not present

## 2015-11-26 DIAGNOSIS — Z885 Allergy status to narcotic agent status: Secondary | ICD-10-CM | POA: Insufficient documentation

## 2015-11-26 DIAGNOSIS — F445 Conversion disorder with seizures or convulsions: Secondary | ICD-10-CM | POA: Diagnosis present

## 2015-11-26 DIAGNOSIS — R569 Unspecified convulsions: Secondary | ICD-10-CM | POA: Diagnosis not present

## 2015-11-26 DIAGNOSIS — Z888 Allergy status to other drugs, medicaments and biological substances status: Secondary | ICD-10-CM | POA: Diagnosis not present

## 2015-11-26 DIAGNOSIS — Z6832 Body mass index (BMI) 32.0-32.9, adult: Secondary | ICD-10-CM | POA: Diagnosis not present

## 2015-11-26 DIAGNOSIS — E876 Hypokalemia: Secondary | ICD-10-CM | POA: Diagnosis not present

## 2015-11-26 DIAGNOSIS — G43909 Migraine, unspecified, not intractable, without status migrainosus: Secondary | ICD-10-CM | POA: Insufficient documentation

## 2015-11-26 DIAGNOSIS — E669 Obesity, unspecified: Secondary | ICD-10-CM | POA: Insufficient documentation

## 2015-11-26 DIAGNOSIS — Y998 Other external cause status: Secondary | ICD-10-CM | POA: Insufficient documentation

## 2015-11-26 DIAGNOSIS — Z9884 Bariatric surgery status: Secondary | ICD-10-CM | POA: Insufficient documentation

## 2015-11-26 DIAGNOSIS — S0990XA Unspecified injury of head, initial encounter: Secondary | ICD-10-CM | POA: Diagnosis not present

## 2015-11-26 DIAGNOSIS — R29898 Other symptoms and signs involving the musculoskeletal system: Secondary | ICD-10-CM

## 2015-11-26 DIAGNOSIS — F900 Attention-deficit hyperactivity disorder, predominantly inattentive type: Secondary | ICD-10-CM | POA: Diagnosis present

## 2015-11-26 LAB — I-STAT CHEM 8, ED
BUN: 18 mg/dL (ref 6–20)
CALCIUM ION: 1.16 mmol/L (ref 1.12–1.23)
CHLORIDE: 109 mmol/L (ref 101–111)
Creatinine, Ser: 0.6 mg/dL (ref 0.44–1.00)
GLUCOSE: 87 mg/dL (ref 65–99)
HCT: 33 % — ABNORMAL LOW (ref 36.0–46.0)
Hemoglobin: 11.2 g/dL — ABNORMAL LOW (ref 12.0–15.0)
Potassium: 4.3 mmol/L (ref 3.5–5.1)
SODIUM: 143 mmol/L (ref 135–145)
TCO2: 22 mmol/L (ref 0–100)

## 2015-11-26 MED ORDER — KETOROLAC TROMETHAMINE 30 MG/ML IJ SOLN
30.0000 mg | Freq: Once | INTRAMUSCULAR | Status: DC
  Filled 2015-11-26: qty 1

## 2015-11-26 MED ORDER — KETOROLAC TROMETHAMINE 15 MG/ML IJ SOLN
15.0000 mg | Freq: Once | INTRAMUSCULAR | Status: DC
  Filled 2015-11-26: qty 1

## 2015-11-26 MED ORDER — HYDROMORPHONE HCL 1 MG/ML IJ SOLN
0.5000 mg | Freq: Once | INTRAMUSCULAR | Status: AC
  Administered 2015-11-26: 0.5 mg via INTRAVENOUS
  Filled 2015-11-26: qty 1

## 2015-11-26 NOTE — ED Notes (Signed)
Patient transported to MRI 

## 2015-11-26 NOTE — ED Notes (Signed)
RN attempted IV access x 2 unsuccessfully

## 2015-11-26 NOTE — ED Notes (Signed)
Patient presents via wheelchair stating she fell while going to get her child out of the tub.  Hit her head on the wall, c/o numbness to her left side and now starting to fel numbness on the right side.   C/o head hurting

## 2015-11-26 NOTE — Telephone Encounter (Signed)
The proper thing for the patient to do is to go to the emergency department at [facility] or [facility] for further evaluation today. I cannot exclude the possibility of Penny [name] syndrome in addition to this there could be injuries from her fall. Family member should take her if not able to do so should go to local emergency department and further evaluation and possible referral/transfer could occur

## 2015-11-26 NOTE — ED Provider Notes (Signed)
CSN: [id number]     Arrival date & time 11/26/15  1900 History   First MD Initiated Contact with Patient 11/26/15 1921     Chief Complaint  Patient presents with  . Fall  . Numbness  . Head Injury     (Consider location/radiation/quality/duration/timing/severity/associated sxs/prior Treatment) Patient is a 29 y.o. female presenting with neurologic complaint. The history is provided by the patient and a relative.  Neurologic Problem This is a recurrent problem. The current episode started more than 1 month ago. The problem occurs constantly. The problem has been gradually worsening. Associated symptoms include arthralgias, myalgias, neck pain, numbness, urinary symptoms and weakness. Pertinent negatives include no abdominal pain, anorexia, change in bowel habit, chest pain, chills, congestion, coughing, diaphoresis, fatigue, fever, headaches, joint swelling, nausea, rash, sore throat, swollen glands, vertigo, visual change or vomiting. The symptoms are aggravated by walking. She has tried immobilization, rest, oral narcotics, position changes and NSAIDs for the symptoms. The treatment provided no relief.    Past Medical History  Diagnosis Date  . Migraines   . Interstitial cystitis   . IUD FEB 2010  . Gastritis JULY 2011  . Depression   . 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 Fatty liver   . Obesity (BMI 30-39.9) 2011 228 LBS BMI 36.8  . Iron deficiency anemia 07/23/2010  . Irritable bowel syndrome 2012 DIARRHEA    JUN 2012 TTG IgA 14.9  . Elevated liver enzymes JUL 2011ALK PHOS 111-127 AST  143-267 ALT  213-321T BILI 0.6  ALB  3.7-4.06 Jun 2011 ALK PHOS 118 AST 24 ALT 42 T BILI 0.4 ALB 3.9  . Chronic daily headache   . Ovarian cyst   . ADD (attention deficit disorder)   . RLQ abdominal pain 07/18/2013  . Menorrhagia 07/18/2013  . Anxiety   . Potassium (K) deficiency   . Dysmenorrhea 09/18/2013  . Stress 09/18/2013  . Patient desires pregnancy 09/18/2013  . Pregnant  12/25/2013  . Blood transfusion without reported diagnosis   . Heart murmur   . Gastric bypass status for obesity   . 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 Dysrhythmia   . Sciatica of left side 11/14/2014  . Fibromyalgia   . 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 PONV (postoperative nausea and vomiting) seizure post-operatively  . Psychiatric pseudoseizure   . Depression    Past Surgical History  Procedure Laterality Date  . Gab  2007    in [place]-POUCH 5 CM  . Cholecystectomy  2005    biliary dyskinesia  . Cath self every nite      for sodium bicarb injection (discontinued 2013)  . Colonoscopy  JUN 2012 ABD PN/DIARRHEA WITH PROPOFOL    NL COLON  . Upper gastrointestinal endoscopy  JULY 2011 NAUSEA-D125,V6, PH 25    Bx; GASTRITIS, POUCH-5 CM LONG  . Dilation and curettage of uterus    . Tonsillectomy    . Gastric bypass  06/2006  . Savory dilation  06/20/2012    Dr. Barnie Alderman gastritis/Ulcer in the mid jejunum. Empiric dilation.   . Tonsillectomy and adenoidectomy    . Esophagogastroduodenoscopy    . Wisdom tooth extraction    . Repair vaginal cuff N/A 07/30/2014    Procedure: REPAIR VAGINAL CUFF;  Surgeon: Mora Bellman, MD;  Location: [facility];  Service: Gynecology;  Laterality: N/A;  . Hysteroscopy w/d&c N/A 09/12/2014    Procedure: DILATATION AND CURETTAGE /HYSTEROSCOPY;  Surgeon: Jonnie Kind, MD;  Location: [facility];  Service: Gynecology;  Laterality: N/A;  . Esophagogastroduodenoscopy (egd) with propofol N/A 04/10/2015    Procedure: ESOPHAGOGASTRODUODENOSCOPY (EGD) WITH PROPOFOL;  Surgeon: Daneil Dolin, MD;  Location: [facility];  Service: Endoscopy;  Laterality: N/A;  . Esophageal dilation N/A 04/10/2015    Procedure: ESOPHAGEAL DILATION WITH 54FR MALONEY DILATOR;  Surgeon: Daneil Dolin, MD;  Location: [facility];  Service: Endoscopy;  Laterality: N/A;  . Esophageal biopsy  04/10/2015    Procedure: BIOPSY;  Surgeon: Daneil Dolin, MD;   Location: [facility];  Service: Endoscopy;;   Family History  Problem Relation Age of Onset  . Hemochromatosis Maternal Grandmother   . Migraines Maternal Grandmother   . Cancer Maternal Grandmother   . Breast cancer Maternal Grandmother   . Hypertension Father   . Diabetes Father   . Coronary artery disease Father   . Migraines Paternal Grandmother   . Breast cancer Paternal Grandmother   . Cancer Mother     breast  . Hemochromatosis Mother   . Breast cancer Mother   . Depression Mother   . Anxiety disorder Mother   . Coronary artery disease Paternal Grandfather   . Anxiety disorder Brother   . Bipolar disorder Brother    Social History  Substance Use Topics  . Smoking status: Former Smoker -- 0.25 packs/day for 6 years    Types: Cigarettes    Quit date: 08/10/2015  . Smokeless tobacco: Never Used  . Alcohol Use: 0.0 oz/week    0 Standard drinks or equivalent per week     Comment: once every weeks - maybe a beer with a dinner   OB History    Gravida Para Term Preterm AB TAB SAB Ectopic Multiple Living   _0 Review of Systems  Constitutional: Negative for fever, chills, diaphoresis and fatigue.  HENT: Negative for congestion and sore throat.   Respiratory: Negative for cough.   Cardiovascular: Negative for chest pain.  Gastrointestinal: Negative for nausea, vomiting, abdominal pain, anorexia and change in bowel habit.  Musculoskeletal: Positive for myalgias, back pain, arthralgias, gait problem and neck pain. Negative for joint swelling.  Skin: Negative for rash.  Neurological: Positive for weakness and numbness. Negative for dizziness, vertigo, tremors, seizures, syncope, facial asymmetry, speech difficulty, light-headedness and headaches.  Psychiatric/Behavioral: Negative for confusion.      Allergies  Gabapentin; Metoclopramide hcl; Codeine; Zofran; Morphine and related; Tramadol; Latex; and Tape  Home Medications   Prior to Admission  medications   Medication Sig Start Date End Date Taking? Authorizing Provider  amphetamine-dextroamphetamine (ADDERALL XR) 30 MG 24 hr capsule Take 1 capsule (30 mg total) by mouth daily. 10/15/15  Yes Cloria Spring, MD  APAP-Pamabrom-Pyrilamine (MENSTRUAL PAIN RELIEF) 500-25-15 MG TABS Take 1-2 tablets by mouth daily as needed (for pain).   Yes Historical Provider, MD  carbamazepine (TEGRETOL) 200 MG tablet Take two tablets at bedtime Patient taking differently: Take 400 mg by mouth at bedtime. Take two tablets at bedtime 10/15/15  Yes Cloria Spring, MD  clonazePAM (KLONOPIN) 0.5 MG tablet Take 1 tablet (0.5 mg total) by mouth 3 (three) times daily as needed for anxiety. 10/17/15 10/16/16 Yes Cloria Spring, MD  DULoxetine (CYMBALTA) 60 MG capsule Take 1 capsule (60 mg total) by mouth daily. 10/15/15  Yes Cloria Spring, MD  HYDROcodone-acetaminophen (NORCO/VICODIN) 5-325 MG tablet Take 1 tablet by mouth  every 6 (six) hours as needed. 11/20/15  Yes Kathyrn Drown, MD  phenazopyridine (PYRIDIUM) 200 MG tablet Take 1 tablet (200 mg total) by mouth 3 (three) times daily with meals. X 7 days Patient taking differently: Take 200 mg by mouth 3 (three) times daily as needed for pain. X 7 days 09/18/15  Yes Kathyrn Drown, MD  promethazine (PHENERGAN) 25 MG tablet Take 1 tablet (25 mg total) by mouth every 6 (six) hours as needed for nausea or vomiting. 06/11/15  Yes Ezequiel Essex, MD  tiZANidine (ZANAFLEX) 4 MG tablet TAKE ONE TABLET BY MOUTH THREE TIMES DAILY AS NEEDED FOR MUSCLE SPASMS. 11/17/15  Yes Kathyrn Drown, MD  topiramate (TOPAMAX) 50 MG tablet Take 50 mg by mouth See admin instructions. Take 100m in the am [id number]   Yes Historical Provider, MD  traZODone (DESYREL) 150 MG tablet Take 1 tablet (150 mg total) by mouth at bedtime as needed for sleep. 10/15/15  Yes DeCloria SpringMD  doxycycline (VIBRAMYCIN) 100 MG capsule Take 1 capsule (100 mg total) by mouth 2 (two) times daily. Patient not  taking: Reported on 11/26/2015 10/07/15   ScKathyrn DrownMD  fluconazole (DIFLUCAN) 150 MG tablet Take 1 tablet (150 mg total) by mouth once. Patient not taking: Reported on 11/26/2015 10/07/15   ScKathyrn DrownMD  ibuprofen (ADVIL,MOTRIN) 800 MG tablet Take 1 tablet (800 mg total) by mouth every 8 (eight) hours as needed for mild pain. Patient not taking: Reported on 11/26/2015 10/20/15   Kristen N Ward, DO  naproxen (NAPROSYN) 500 MG tablet Take 1 tablet (500 mg total) by mouth 2 (two) times daily. Patient not taking: Reported on 11/26/2015 10/18/15   HoAshley MurrainNP   BP 113/64 mmHg  Pulse 61  Resp 16  Ht 5' 6" (1.676 m)  Wt 90.719 kg  BMI 32.30 kg/m2  SpO2 97%  LMP 11/13/2015 Physical Exam  Constitutional: She appears well-developed and well-nourished. She appears distressed.  HENT:  Head: Head is without abrasion, without contusion and without laceration.  Right Ear: No hemotympanum.  Left Ear: No hemotympanum.  Mouth/Throat: Oropharynx is clear and moist.  Eyes: Conjunctivae and EOM are normal. Pupils are equal, round, and reactive to light.  Cardiovascular: Normal rate, regular rhythm, S1 normal, normal heart sounds and normal pulses.   No murmur heard. Pulmonary/Chest: Effort normal and breath sounds normal. No tachypnea. She has no decreased breath sounds. She has no wheezes. She has no rhonchi. She has no rales.  Abdominal: Normal appearance and bowel sounds are normal. There is no tenderness. There is no rigidity, no guarding, no CVA tenderness, no tenderness at McBurney's point and negative Murphy's sign. No hernia.  Musculoskeletal:       Cervical back: She exhibits pain.       Thoracic back: She exhibits pain.       Lumbar back: She exhibits pain.  Neurological: She is alert. A cranial nerve deficit and sensory deficit is present. GCS eye subscore is 4. GCS verbal subscore is 5. GCS motor subscore is 6.  Pt reported no sensation in bilateral lower extremities.  Patient  made no effort to dorsiflex or plantar flex at ankle joint. Patient was unable to lift legs off the bed. Patient reports pain in bilateral legs. When painful stimuli applied to legs patient retracts legs bilaterally. Patient reported significant tenderness along spine. Patient reported no sensation to right side of face. Patient had no neurovascular compromise in upper  extremities. Patient did have decreased reflexes in bilateral lower extremities.  Skin: No abrasion and no rash noted. She is not diaphoretic.  Psychiatric: Her speech is normal and behavior is normal. Her mood appears anxious. Cognition and memory are normal.    ED Course  Procedures (including critical care time) Labs Review Labs Reviewed  I-STAT CHEM 8, ED - Abnormal; Notable for the following:    Hemoglobin 11.2 (*)    HCT 33.0 (*)    All other components within normal limits  VITAMIN B12  TSH    Imaging Review Mr Lumbar Spine Wo Contrast  11/26/2015  CLINICAL DATA:  Initial evaluation for bilateral lower extremity numbness. EXAM: MRI LUMBAR SPINE WITHOUT CONTRAST TECHNIQUE: Multiplanar, multisequence MR imaging of the lumbar spine was performed. No intravenous contrast was administered. COMPARISON:  Prior study from 12/30/2014. FINDINGS: For the purposes of this dictation, the lowest well-formed intervertebral disc spaces presumed to be the L5-S1 level, and there presumed to be 5 lumbar type vertebral bodies. Vertebral bodies are normally aligned with preservation of the normal lumbar lordosis. Vertebral body heights are well maintained. No fracture or listhesis. Signal intensity within the vertebral body bone marrow is normal. No focal osseous lesions. No marrow edema. Conus medullaris terminates normally at the L1 level. Signal intensity within the visualized cord is normal. Nerve roots of the cauda equina are unremarkable. Paraspinous soft tissues demonstrate no acute abnormality. T10-11:  Negative. T12-L1:  Negative. L1-2:   Negative. L2-3:  Negative interspace.  Minimal facet hypertrophy. L3-4: Disc desiccation with small right paracentral disc protrusion indents and mildly flattens the right ventral thecal sac without significant stenosis or evidence of neural impingement. Overall, the size of the protruding disc is slightly decreased relative to the previous study P There is an associated annular fissure. Mild ligamentum and facet hypertrophy. Foramina are widely patent. L4-5: Diffuse disc desiccation with shallow right paracentral disc protrusion. The protruding disc minimally indents the right ventral thecal sac without significant stenosis or evidence of neural impingement. Overall, size of the disc is not significantly changed relative to previous. Mild facet and ligamentum flavum hypertrophy. No significant canal or foraminal stenosis. L5-S1: No disc bulge or disc protrusion. Mild bilateral facet arthrosis. No significant canal or foraminal stenosis. IMPRESSION: 1. Stable appearance of the lumbar spine. No evidence of severe stenosis or cord compression. No new disc herniation. 2. Small right paracentral disc protrusion at L3-4 without stenosis or neural impingement. 3. Small right paracentral disc protrusion at L4-5 without stenosis or evidence of neural impingement. Electronically Signed   By: Jeannine Boga M.D.   On: 11/26/2015 21:26   I have personally reviewed and evaluated these images and lab results as part of my medical decision-making.   EKG Interpretation None      MDM   Final diagnoses:  Bilateral leg numbness    29 year old Caucasian female with past medical history of pseudoseizures, drug-seeking behavior, migraines presents in setting of numerous neurologic complaints. Per patient over last several months she has had worsening numbness and weakness in bilateral lower extremities. She does initially presented in left side the patient was seen by orthopedic doctor who recommended patient  follow-up with a neurologist. Patient reports it progressed to right side. She reports during this entire time she has had significant generalized back pain worse and low back. Patient blames all of symptoms on a fall and an epidural during pregnancy. Patient additionally reports right-sided facial numbness. During this time patient reports intermittent urinary incontinence. She  reports multiple falls at home and using a walker at home. Patient reports today she had another fall striking her head without LOC. Due to continued complaints patient was brought to the emergency department for further evaluation.  On arrival patient was hemodynamically stable and had no signs of obvious trauma on examination. Patient reported no sensation in lower extremities. She did withdraw to painful stimuli in bilateral lower extremities. She was unable to lift leg off the bed or make any movement at the knee ankle or hip joint when requested. Patient reported tenderness along the entirety of spine without any obvious step-offs or deformity. Patient reported sensation intact in upper extremities and strength was within normal limits in upper extremities. Patient reported no sensation on the right side of face otherwise cranial nerves II through XII intact bilaterally. Patient has had numerous CT head and without obvious trauma do not believe repeat CT head is necessary at this time. Laboratory analysis obtained as well as MRI of lumbar spine to rule out cauda equina in setting of urinary incontinence and numbness. All reassess after imaging. Patient requested narcotic pain medicine but due to patient's history I do not feel comfortable giving narcotic pain medicine at this time. Patient was offered Toradol and she will reported concern for seizure activity if she were to take medicine  MRI did not reveal significant canal stenosis or other abnormality that would account for patient's symptoms.  Laboratory analysis without  significant abnormalities. In setting of these findings will consult neurology for further recommendations.  Neurology has seen and evaluated patient and I have discussed case with neurology.  Due to patient's inability to ambulate at this time patient will be admitted to medicine for further management of symptoms. Patient stable at time of admission. He  Attending has seen and evaluated patient and Dr. Audie Pinto is in agreement with plan.    Esaw Grandchild, MD 11/27/15 [id number]  Leonard Schwartz, MD 12/04/15 [phone]

## 2015-11-26 NOTE — Telephone Encounter (Signed)
Discussed with patient. Advised patient that Dr Nicki Reaper recommends- The proper thing for the patient to do is to go to the emergency department at [facility] or [facility] for further evaluation today. Dr Nicki Reaper cannot exclude the possibility of Jeannette Corpus syndrome in addition to this there could be injuries from her fall. Family member should take her if not able to do so should go to local emergency department and further evaluation and possible referral/transfer could occur. Patient verbalized understanding and states she will have a family menber take her to [place] for evaluation right now.

## 2015-11-26 NOTE — Consult Note (Signed)
Consult Reason for Consult:weakness and paresthesias Referring Physician: Dr Audie Pinto ED  CC: weakness  HPI: Penny [name] is an 29 y.o. female with hx of multiple psychiatric problems, including non-epileptic seizures presenting with lower extremity pain, numbness and weakness. Reports symptoms started back in August 2015 after having an epidural during childbirth. At that time reports weakness/numbness in LLE that slowly got better. Reports having a fall in October 2016 in which she injured her left ankle. Since then has had progressive worsening weakness/numbness in her LLE. Has had multiple falls since then. Today had another fall after which she also notes weakness and numbness of her RLE. Brought in today by her mother after being unable to walk. Recently also notes some bilateral upper extremity paresthesias of her hands. She also notes numbness of the left side of her face, reports this has been ongoing for months. Notes loss of urinary control, appears to be urge incontinence as she feels the need to go but cannot control it.   MRI lumbar spine imaging reviewed, overall stable. Shows small right paracentral disc protrusions at L3-4 and L4-5 without stenosis or neural impingement.   Past Medical History  Diagnosis Date  . Migraines   . Interstitial cystitis   . IUD FEB 2010  . Gastritis JULY 2011  . Depression   . 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 Fatty liver   . Obesity (BMI 30-39.9) 2011 228 LBS BMI 36.8  . Iron deficiency anemia 07/23/2010  . Irritable bowel syndrome 2012 DIARRHEA    JUN 2012 TTG IgA 14.9  . Elevated liver enzymes JUL 2011ALK PHOS 111-127 AST  143-267 ALT  213-321T BILI 0.6  ALB  3.7-4.06 Jun 2011 ALK PHOS 118 AST 24 ALT 42 T BILI 0.4 ALB 3.9  . Chronic daily headache   . Ovarian cyst   . ADD (attention deficit disorder)   . RLQ abdominal pain 07/18/2013  . Menorrhagia 07/18/2013  . Anxiety   . Potassium (K) deficiency   . Dysmenorrhea 09/18/2013  .  Stress 09/18/2013  . Patient desires pregnancy 09/18/2013  . Pregnant 12/25/2013  . Blood transfusion without reported diagnosis   . Heart murmur   . Gastric bypass status for obesity   . Seizures ([place]) 07/31/2014    non-epileptic  . Dysrhythmia   . Sciatica of left side 11/14/2014  . Fibromyalgia   . 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 PONV (postoperative nausea and vomiting) seizure post-operatively  . Psychiatric pseudoseizure   . Depression     Past Surgical History  Procedure Laterality Date  . Gab  2007    in [place]-POUCH 5 CM  . Cholecystectomy  2005    biliary dyskinesia  . Cath self every nite      for sodium bicarb injection (discontinued 2013)  . Colonoscopy  JUN 2012 ABD PN/DIARRHEA WITH PROPOFOL    NL COLON  . Upper gastrointestinal endoscopy  JULY 2011 NAUSEA-D125,V6, PH 25    Bx; GASTRITIS, POUCH-5 CM LONG  . Dilation and curettage of uterus    . Tonsillectomy    . Gastric bypass  06/2006  . Savory dilation  06/20/2012    Dr. Barnie Alderman gastritis/Ulcer in the mid jejunum. Empiric dilation.   . Tonsillectomy and adenoidectomy    . Esophagogastroduodenoscopy    . Wisdom tooth extraction    . Repair vaginal cuff N/A 07/30/2014    Procedure: REPAIR VAGINAL CUFF;  Surgeon: Mora Bellman,  MD;  Location: [facility];  Service: Gynecology;  Laterality: N/A;  . Hysteroscopy w/d&c N/A 09/12/2014    Procedure: DILATATION AND CURETTAGE /HYSTEROSCOPY;  Surgeon: Jonnie Kind, MD;  Location: [facility];  Service: Gynecology;  Laterality: N/A;  . Esophagogastroduodenoscopy (egd) with propofol N/A 04/10/2015    Procedure: ESOPHAGOGASTRODUODENOSCOPY (EGD) WITH PROPOFOL;  Surgeon: Daneil Dolin, MD;  Location: [facility];  Service: Endoscopy;  Laterality: N/A;  . Esophageal dilation N/A 04/10/2015    Procedure: ESOPHAGEAL DILATION WITH 54FR MALONEY DILATOR;  Surgeon: Daneil Dolin, MD;  Location: [facility];  Service: Endoscopy;  Laterality: N/A;  . Esophageal  biopsy  04/10/2015    Procedure: BIOPSY;  Surgeon: Daneil Dolin, MD;  Location: [facility];  Service: Endoscopy;;    Family History  Problem Relation Age of Onset  . Hemochromatosis Maternal Grandmother   . Migraines Maternal Grandmother   . Cancer Maternal Grandmother   . Breast cancer Maternal Grandmother   . Hypertension Father   . Diabetes Father   . Coronary artery disease Father   . Migraines Paternal Grandmother   . Breast cancer Paternal Grandmother   . Cancer Mother     breast  . Hemochromatosis Mother   . Breast cancer Mother   . Depression Mother   . Anxiety disorder Mother   . Coronary artery disease Paternal Grandfather   . Anxiety disorder Brother   . Bipolar disorder Brother     Social History:  reports that she quit smoking about 3 months ago. Her smoking use included Cigarettes. She has a 1.5 pack-year smoking history. She has never used smokeless tobacco. She reports that she drinks alcohol. She reports that she does not use illicit drugs.  Allergies  Allergen Reactions  . Gabapentin Other (See Comments)    Pt states that she was unresponsive after taking this medication, but her vitals remained stable.    . Metoclopramide Hcl Anxiety and Other (See Comments)    Pt states that she felt like she was trapped in a box, and could not get out.  Pt also states that she had temporary loss of movement, weakness, and tingling.    . Codeine Nausea And Vomiting    Pt reports that she takes this med with no problems now EMO RN  . Zofran [Ondansetron] Other (See Comments)    Migraines   . Morphine And Related     Chest pain   . Tramadol     Seizures  . Latex Rash  . Tape Itching and Rash    Please use paper tape    Medications: I have reviewed the patient's current medications.  ROS: Out of a complete 14 system review, the patient complains of only the following symptoms, and all other reviewed systems are negative. +weakness  Physical Examination: Filed  Vitals:   11/26/15 2125 11/26/15 2126  BP: 110/66   Pulse:  70  Resp:     Physical Exam  Constitutional: He appears well-developed and well-nourished.  Psych: Affect appropriate to situation Eyes: No scleral injection HENT: No OP obstrucion Head: Normocephalic.  Cardiovascular: Normal rate and regular rhythm.  Respiratory: Effort normal and breath sounds normal.  GI: Soft. Bowel sounds are normal. No distension. There is no tenderness.  Skin: WDI  Neurologic Examination Mental Status: Alert, oriented, thought content appropriate.  Speech fluent without evidence of aphasia.  Able to follow 3 step commands without difficulty. Cranial Nerves: II: funduscopic exam wnl bilaterally, visual fields grossly normal, pupils equal, round,  reactive to light and accommodation III,IV, VI: ptosis not present, extra-ocular motions intact bilaterally V,VII: smile symmetric, Diminished light touch left side of face that splits the midline. Loss of vibration on left side of face VIII: hearing normal bilaterally IX,X: gag reflex present XI: trapezius strength/neck flexion strength normal bilaterally XII: tongue strength normal  Motor: Right : Upper extremity    Left:     Upper extremity 5/5 deltoid       5/5 deltoid 5/5 biceps      5/5 biceps  5/5 triceps      5/5 triceps 5/5 hand grip      5/5 hand grip  Lower extremity     Lower extremity No movement of proximal LE bilaterally with + Hoover's sign. Wiggle bilateral toes  Tone and bulk:normal tone throughout; no atrophy noted Sensory: diminished LT and PP bilateral LE in a non-dermatomal distribution. Severe pain noted with palpation or movement. With noxious stimuli will withdrawal in bilateral LE Deep Tendon Reflexes: 1+ and symmetric throughout with exception of absent AJs bialterally Plantars: Right: downgoing   Left: downgoing Cerebellar: normal finger-to-nose, unable to test HTS Gait: unable to test  Laboratory Studies:   Basic  Metabolic Panel:  Recent Labs Lab 11/26/15 2011  NA 143  K 4.3  CL 109  GLUCOSE 87  BUN 18  CREATININE 0.60    Liver Function Tests: No results for input(s): AST, ALT, ALKPHOS, BILITOT, PROT, ALBUMIN in the last 168 hours. No results for input(s): LIPASE, AMYLASE in the last 168 hours. No results for input(s): AMMONIA in the last 168 hours.  CBC:  Recent Labs Lab 11/26/15 2011  HGB 11.2*  HCT 33.0*    Cardiac Enzymes: No results for input(s): CKTOTAL, CKMB, CKMBINDEX, TROPONINI in the last 168 hours.  BNP: Invalid input(s): POCBNP  CBG: No results for input(s): GLUCAP in the last 168 hours.  Microbiology: Results for orders placed or performed during the hospital encounter of 08/17/15  Urine culture     Status: None   Collection Time: 08/17/15  2:09 PM  Result Value Ref Range Status   Specimen Description URINE, CLEAN CATCH  Final   Special Requests Normal  Final   Culture   Final    >=100,000 COLONIES/mL ESCHERICHIA COLI Performed at [facility]    Report Status 08/19/2015 FINAL  Final   Organism ID, Bacteria ESCHERICHIA COLI  Final      Susceptibility   Escherichia coli - MIC*    AMPICILLIN >=32 RESISTANT Resistant     CEFAZOLIN 16 SENSITIVE Sensitive     CEFTRIAXONE <=1 SENSITIVE Sensitive     CIPROFLOXACIN <=0.25 SENSITIVE Sensitive     GENTAMICIN <=1 SENSITIVE Sensitive     IMIPENEM <=0.25 SENSITIVE Sensitive     NITROFURANTOIN <=16 SENSITIVE Sensitive     TRIMETH/SULFA <=20 SENSITIVE Sensitive     AMPICILLIN/SULBACTAM >=32 RESISTANT Resistant     PIP/TAZO <=4 SENSITIVE Sensitive     * >=100,000 COLONIES/mL ESCHERICHIA COLI    Coagulation Studies: No results for input(s): LABPROT, INR in the last 72 hours.  Urinalysis: No results for input(s): COLORURINE, LABSPEC, PHURINE, GLUCOSEU, HGBUR, BILIRUBINUR, KETONESUR, PROTEINUR, UROBILINOGEN, NITRITE, LEUKOCYTESUR in the last 168 hours.  Invalid input(s): APPERANCEUR  Lipid Panel:      Component Value Date/Time   CHOL 95 03/10/2012 0642   TRIG 50 03/10/2012 0642   HDL 38* 03/10/2012 0642   CHOLHDL 2.5 03/10/2012 0642   VLDL 10 03/10/2012 0642   LDLCALC 47 03/10/2012  6812    HgbA1C:  Lab Results  Component Value Date   HGBA1C 5.7* 01/15/2015    Urine Drug Screen:     Component Value Date/Time   LABOPIA NONE DETECTED 05/13/2015 2247   LABOPIA NEG 02/18/2014 1234   COCAINSCRNUR NONE DETECTED 05/13/2015 2247   COCAINSCRNUR NEG 02/18/2014 1234   LABBENZ POSITIVE* 05/13/2015 2247   LABBENZ NEG 02/18/2014 1234   AMPHETMU NONE DETECTED 05/13/2015 2247   AMPHETMU NEG 02/18/2014 1234   THCU NONE DETECTED 05/13/2015 2247   LABBARB NONE DETECTED 05/13/2015 2247    Alcohol Level: No results for input(s): ETH in the last 168 hours.  Other results:  Imaging: Mr Lumbar Spine Wo Contrast  11/26/2015  CLINICAL DATA:  Initial evaluation for bilateral lower extremity numbness. EXAM: MRI LUMBAR SPINE WITHOUT CONTRAST TECHNIQUE: Multiplanar, multisequence MR imaging of the lumbar spine was performed. No intravenous contrast was administered. COMPARISON:  Prior study from 12/30/2014. FINDINGS: For the purposes of this dictation, the lowest well-formed intervertebral disc spaces presumed to be the L5-S1 level, and there presumed to be 5 lumbar type vertebral bodies. Vertebral bodies are normally aligned with preservation of the normal lumbar lordosis. Vertebral body heights are well maintained. No fracture or listhesis. Signal intensity within the vertebral body bone marrow is normal. No focal osseous lesions. No marrow edema. Conus medullaris terminates normally at the L1 level. Signal intensity within the visualized cord is normal. Nerve roots of the cauda equina are unremarkable. Paraspinous soft tissues demonstrate no acute abnormality. T10-11:  Negative. T12-L1:  Negative. L1-2:  Negative. L2-3:  Negative interspace.  Minimal facet hypertrophy. L3-4: Disc desiccation with small  right paracentral disc protrusion indents and mildly flattens the right ventral thecal sac without significant stenosis or evidence of neural impingement. Overall, the size of the protruding disc is slightly decreased relative to the previous study P There is an associated annular fissure. Mild ligamentum and facet hypertrophy. Foramina are widely patent. L4-5: Diffuse disc desiccation with shallow right paracentral disc protrusion. The protruding disc minimally indents the right ventral thecal sac without significant stenosis or evidence of neural impingement. Overall, size of the disc is not significantly changed relative to previous. Mild facet and ligamentum flavum hypertrophy. No significant canal or foraminal stenosis. L5-S1: No disc bulge or disc protrusion. Mild bilateral facet arthrosis. No significant canal or foraminal stenosis. IMPRESSION: 1. Stable appearance of the lumbar spine. No evidence of severe stenosis or cord compression. No new disc herniation. 2. Small right paracentral disc protrusion at L3-4 without stenosis or neural impingement. 3. Small right paracentral disc protrusion at L4-5 without stenosis or evidence of neural impingement. Electronically Signed   By: Jeannine Boga M.D.   On: 11/26/2015 21:26     Assessment/Plan:  29y/o with extensive psychiatric history, including non-epileptic seizures, presenting with weakness and paresthesias of bilateral LE with minimal paresthesias of distal bilateral UE. Reported loss of urinary control appears to be urge incontinence based on description. Unclear etiology of presentation. History and exam findings atypical for GBS. Exam with multiple functional findings and strongly suspect there is a functional component to her presentation.    -MRI C spine -would hold on LP at this time due to low suspicion that this represents GBS -check B12, TSH -rehab evaluation -would benefit from outpatient EMG/NCS -consider psychiatry  evaluation   Jim Like, DO [facility] [phone]  If 7pm- 7am, please page neurology on call as listed in [place]. 11/26/2015, 10:35 PM

## 2015-11-26 NOTE — Telephone Encounter (Signed)
Pt called stating that she had a really bad fall this afternoon and has lost all feeling in the left leg and partial in the right leg. Pt also states that the pain in her back is really bad. Pt states that she hit her head and left shoulder when she fell. Pt did not go to the hospital.

## 2015-11-27 ENCOUNTER — Observation Stay (HOSPITAL_COMMUNITY): Payer: Medicaid Other

## 2015-11-27 ENCOUNTER — Encounter (HOSPITAL_COMMUNITY): Payer: Self-pay

## 2015-11-27 DIAGNOSIS — F900 Attention-deficit hyperactivity disorder, predominantly inattentive type: Secondary | ICD-10-CM | POA: Diagnosis not present

## 2015-11-27 DIAGNOSIS — D509 Iron deficiency anemia, unspecified: Secondary | ICD-10-CM

## 2015-11-27 DIAGNOSIS — F418 Other specified anxiety disorders: Secondary | ICD-10-CM

## 2015-11-27 DIAGNOSIS — R208 Other disturbances of skin sensation: Secondary | ICD-10-CM | POA: Diagnosis not present

## 2015-11-27 DIAGNOSIS — R202 Paresthesia of skin: Secondary | ICD-10-CM | POA: Insufficient documentation

## 2015-11-27 LAB — URINALYSIS, ROUTINE W REFLEX MICROSCOPIC
BILIRUBIN URINE: NEGATIVE
Glucose, UA: NEGATIVE mg/dL
Hgb urine dipstick: NEGATIVE
Ketones, ur: NEGATIVE mg/dL
LEUKOCYTES UA: NEGATIVE
NITRITE: NEGATIVE
PH: 6 (ref 5.0–8.0)
Protein, ur: NEGATIVE mg/dL
SPECIFIC GRAVITY, URINE: 1.026 (ref 1.005–1.030)

## 2015-11-27 LAB — TSH: TSH: 0.977 u[IU]/mL (ref 0.350–4.500)

## 2015-11-27 LAB — CBC
HEMATOCRIT: 33.5 % — AB (ref 36.0–46.0)
Hemoglobin: 10.2 g/dL — ABNORMAL LOW (ref 12.0–15.0)
MCH: 27.3 pg (ref 26.0–34.0)
MCHC: 30.4 g/dL (ref 30.0–36.0)
MCV: 89.6 fL (ref 78.0–100.0)
Platelets: 252 10*3/uL (ref 150–400)
RBC: 3.74 MIL/uL — ABNORMAL LOW (ref 3.87–5.11)
RDW: 16.8 % — AB (ref 11.5–15.5)
WBC: 4.7 10*3/uL (ref 4.0–10.5)

## 2015-11-27 LAB — FOLATE: Folate: 4.9 ng/mL — ABNORMAL LOW (ref >5.9)

## 2015-11-27 LAB — C-REACTIVE PROTEIN

## 2015-11-27 LAB — RETICULOCYTES
RBC.: 3.9 MIL/uL (ref 3.87–5.11)
RETIC COUNT ABSOLUTE: 46.8 10*3/uL (ref 19.0–186.0)
Retic Ct Pct: 1.2 % (ref 0.4–3.1)

## 2015-11-27 LAB — SEDIMENTATION RATE: Sed Rate: 19 mm/hr (ref 0–22)

## 2015-11-27 LAB — IRON AND TIBC
Iron: 24 ug/dL — ABNORMAL LOW (ref 28–170)
Saturation Ratios: 6 % — ABNORMAL LOW (ref 10.4–31.8)
TIBC: 414 ug/dL (ref 250–450)
UIBC: 390 ug/dL

## 2015-11-27 LAB — VITAMIN B12: Vitamin B-12: 410 pg/mL (ref 180–914)

## 2015-11-27 LAB — FERRITIN: Ferritin: 5 ng/mL — ABNORMAL LOW (ref 11–307)

## 2015-11-27 MED ORDER — GADOBENATE DIMEGLUMINE 529 MG/ML IV SOLN
20.0000 mL | Freq: Once | INTRAVENOUS | Status: AC
  Administered 2015-11-27: 20 mL via INTRAVENOUS

## 2015-11-27 MED ORDER — CLONAZEPAM 0.5 MG PO TABS
0.5000 mg | ORAL_TABLET | Freq: Three times a day (TID) | ORAL | Status: DC | PRN
  Administered 2015-11-27 (×2): 0.5 mg via ORAL
  Filled 2015-11-27 (×2): qty 1

## 2015-11-27 MED ORDER — TOPIRAMATE 25 MG PO TABS
50.0000 mg | ORAL_TABLET | Freq: Every day | ORAL | Status: DC
  Administered 2015-11-27: 50 mg via ORAL
  Filled 2015-11-27: qty 2

## 2015-11-27 MED ORDER — SODIUM CHLORIDE 0.9 % IV SOLN
25.0000 mg | Freq: Once | INTRAVENOUS | Status: DC
  Filled 2015-11-27: qty 0.5

## 2015-11-27 MED ORDER — LIDOCAINE 5 % EX PTCH
1.0000 | MEDICATED_PATCH | CUTANEOUS | Status: DC
  Administered 2015-11-27: 1 via TRANSDERMAL
  Filled 2015-11-27: qty 1

## 2015-11-27 MED ORDER — AMPHETAMINE-DEXTROAMPHET ER 10 MG PO CP24
30.0000 mg | ORAL_CAPSULE | Freq: Every day | ORAL | Status: DC
  Filled 2015-11-27: qty 3

## 2015-11-27 MED ORDER — ENOXAPARIN SODIUM 40 MG/0.4ML ~~LOC~~ SOLN
40.0000 mg | SUBCUTANEOUS | Status: DC
  Administered 2015-11-27: 40 mg via SUBCUTANEOUS
  Filled 2015-11-27: qty 0.4

## 2015-11-27 MED ORDER — TOPIRAMATE 100 MG PO TABS
100.0000 mg | ORAL_TABLET | Freq: Every day | ORAL | Status: DC
  Administered 2015-11-27: 100 mg via ORAL
  Filled 2015-11-27: qty 1

## 2015-11-27 MED ORDER — CARBAMAZEPINE 200 MG PO TABS
400.0000 mg | ORAL_TABLET | Freq: Every day | ORAL | Status: DC
  Administered 2015-11-27: 400 mg via ORAL
  Filled 2015-11-27: qty 2

## 2015-11-27 MED ORDER — DULOXETINE HCL 60 MG PO CPEP
60.0000 mg | ORAL_CAPSULE | Freq: Every day | ORAL | Status: DC
  Administered 2015-11-27: 60 mg via ORAL
  Filled 2015-11-27: qty 1

## 2015-11-27 MED ORDER — PROMETHAZINE HCL 25 MG PO TABS: 25.0000 mg | ORAL_TABLET | Freq: Four times a day (QID) | ORAL | Status: DC | PRN

## 2015-11-27 MED ORDER — HYDROMORPHONE HCL 1 MG/ML IJ SOLN
0.2500 mg | INTRAMUSCULAR | Status: DC | PRN
  Administered 2015-11-27 (×2): 0.25 mg via INTRAVENOUS
  Filled 2015-11-27 (×2): qty 1

## 2015-11-27 MED ORDER — OXYCODONE HCL 5 MG PO TABS
5.0000 mg | ORAL_TABLET | ORAL | Status: DC | PRN
  Administered 2015-11-27 (×3): 5 mg via ORAL
  Filled 2015-11-27 (×3): qty 1

## 2015-11-27 MED ORDER — TRAZODONE HCL 100 MG PO TABS: 150.0000 mg | ORAL_TABLET | Freq: Every evening | ORAL | Status: DC | PRN

## 2015-11-27 MED ORDER — LIDOCAINE 5 % EX PTCH: 1.0000 | MEDICATED_PATCH | CUTANEOUS | Status: DC

## 2015-11-27 MED ORDER — DOCUSATE SODIUM 100 MG PO CAPS
100.0000 mg | ORAL_CAPSULE | Freq: Two times a day (BID) | ORAL | Status: DC
  Administered 2015-11-27: 100 mg via ORAL
  Filled 2015-11-27: qty 1

## 2015-11-27 MED ORDER — SODIUM CHLORIDE 0.9 % IV SOLN
1000.0000 mg | Freq: Once | INTRAVENOUS | Status: DC
  Filled 2015-11-27: qty 20

## 2015-11-27 NOTE — Progress Notes (Signed)
Patient requesting Norco prescription at discharge pt states she only has 5 pills left with no refill..  Paged MD

## 2015-11-27 NOTE — Progress Notes (Signed)
Patient's MRI results are complete.  MD notified.  Patient asking for lidocaine patch prescription at d/c

## 2015-11-27 NOTE — Progress Notes (Signed)
repaging Dr Venetia Constable about incomplete AVS, MRI results being back and patient requesting a lidocaine patch prescription.

## 2015-11-27 NOTE — Discharge Summary (Signed)
Physician Discharge Summary  Penny [name] Penny [name] [name] DOB: Jun 16, 1986 DOA: 11/26/2015  PCP: Penny Lange, MD  Admit date: 11/26/2015 Discharge date: 11/27/2015  Time spent: 15mnutes  Recommendations for Outpatient Follow-up:  1. Follow-up with neurologist 1-2 week will probably need an EMG.   Discharge Diagnoses:  Principal Problem:   Bilateral leg numbness Active Problems:   Iron deficiency anemia   Depression with anxiety   ADD (attention deficit disorder) without hyperactivity   [place]   Paresthesias   Discharge Condition: stable  Diet recommendation: regular  Filed Weights   11/26/15 1908 11/27/15 0139  Weight: 90.719 kg (200 lb) 91.6 kg (201 lb 15.1 oz)    History of present illness:  29year-old past medical history of migraine and pseudoseizures and anxiety who comes in reporting left leg numbness and then falling and hit her her head. She relates weakness on this leg which has progressively gotten worse.  [name] Course:  Bilateral lower extremity numbness left greater than right: - Neurology was consulted and recommended B-12, ESR and TSH which were not abnormal. - MRI of the lumbar spine did not show any acute abnormalities, MRI of the C-spine did not show any acute abnormalities. - Therapy was consulted who recommended home therapy. - Will need outpatient psychiatric evaluation. - MRI brain showed no acute finding.  Iron deficiency anemia: - Ferritin 5, was given IV iron cont oral iron as an outpatient.  Anxiety and depression: - Continue current home regimen no changes were made.  History of pseudoseizures: -continue Tegretol.  Procedures:  MRI c-spine  MRI brain  Consultations:  neurology  Discharge Exam: Filed Vitals:   11/27/15 0139 11/27/15 0546  BP: 107/64 103/50  Pulse: 58 52  Temp: 97.6 F (36.4 C) 97.6 F (36.4 C)  Resp: 18 18    General: A&O x3 Cardiovascular: RRR Respiratory: good air movement CTA  B/L  Discharge Instructions   Discharge Instructions    Diet - low sodium heart healthy    Complete by:  As directed      Increase activity slowly    Complete by:  As directed           Current Discharge Medication List    CONTINUE these medications which have NOT CHANGED   Details  amphetamine-dextroamphetamine (ADDERALL XR) 30 MG 24 hr capsule Take 1 capsule (30 mg total) by mouth daily. Qty: 30 capsule, Refills: 0    APAP-Pamabrom-Pyrilamine (MENSTRUAL PAIN RELIEF) 500-25-15 MG TABS Take 1-2 tablets by mouth daily as needed (for pain).    carbamazepine (TEGRETOL) 200 MG tablet Take two tablets at bedtime Qty: 60 tablet, Refills: 2    clonazePAM (KLONOPIN) 0.5 MG tablet Take 1 tablet (0.5 mg total) by mouth 3 (three) times daily as needed for anxiety. Qty: 90 tablet, Refills: 2    DULoxetine (CYMBALTA) 60 MG capsule Take 1 capsule (60 mg total) by mouth daily. Qty: 30 capsule, Refills: 2    HYDROcodone-acetaminophen (NORCO/VICODIN) 5-325 MG tablet Take 1 tablet by mouth every 6 (six) hours as needed. Qty: 40 tablet, Refills: 0    tiZANidine (ZANAFLEX) 4 MG tablet TAKE ONE TABLET BY MOUTH THREE TIMES DAILY AS NEEDED FOR MUSCLE SPASMS. Qty: 90 tablet, Refills: 0    topiramate (TOPAMAX) 50 MG tablet Take 50 mg by mouth See admin instructions. Take '50mg'$  in the am '100mg'$  qhs    traZODone (DESYREL) 150 MG tablet Take 1 tablet (150 mg total) by mouth at bedtime as needed for sleep. Qty: 30  tablet, Refills: 2    fluconazole (DIFLUCAN) 150 MG tablet Take 1 tablet (150 mg total) by mouth once. Qty: 1 tablet, Refills: 4    ibuprofen (ADVIL,MOTRIN) 800 MG tablet Take 1 tablet (800 mg total) by mouth every 8 (eight) hours as needed for mild pain. Qty: 30 tablet, Refills: 0      STOP taking these medications     phenazopyridine (PYRIDIUM) 200 MG tablet      promethazine (PHENERGAN) 25 MG tablet      doxycycline (VIBRAMYCIN) 100 MG capsule      naproxen (NAPROSYN) 500 MG  tablet        Allergies  Allergen Reactions  . Gabapentin Other (See Comments)    Pt states that she was unresponsive after taking this medication, but her vitals remained stable.    . Metoclopramide Hcl Anxiety and Other (See Comments)    Pt states that she felt like she was trapped in a box, and could not get out.  Pt also states that she had temporary loss of movement, weakness, and tingling.    . Codeine Nausea And Vomiting    Pt reports that she takes this med with no problems now EMO RN  . Zofran [Ondansetron] Other (See Comments)    Migraines   . Morphine And Related     Chest pain   . Tramadol     Seizures  . Latex Rash  . Tape Itching and Rash    Please use paper tape      The results of significant diagnostics from this hospitalization (including imaging, microbiology, ancillary and laboratory) are listed below for reference.    Significant Diagnostic Studies: Ct Head Wo Contrast  11/15/2015  CLINICAL DATA:  Fall, hit head on dresser history of seizures EXAM: CT HEAD WITHOUT CONTRAST TECHNIQUE: Contiguous axial images were obtained from the base of the skull through the vertex without intravenous contrast. COMPARISON:  05/09/2015 FINDINGS: No skull fracture is noted. Again noted mild mucosal thickening and tiny air-fluid level in right maxillary sinus. The mastoid air cells are unremarkable. No intracranial hemorrhage, mass effect or midline shift. No acute cortical infarction. No mass lesion is noted on this unenhanced scan. There are motion artifacts images of the high convexity vertex. No hydrocephalus. IMPRESSION: No acute intracranial abnormality. Motion artifacts are noted last images high convexity vertex. Again noticed small mucosal thickening in right maxillary sinus. Electronically Signed   By: Natasha Mead M.D.   On: 11/15/2015 17:34   Mr Lumbar Spine Wo Contrast  11/26/2015  CLINICAL DATA:  Initial evaluation for bilateral lower extremity numbness. EXAM: MRI  LUMBAR SPINE WITHOUT CONTRAST TECHNIQUE: Multiplanar, multisequence MR imaging of the lumbar spine was performed. No intravenous contrast was administered. COMPARISON:  Prior study from 12/30/2014. FINDINGS: For the purposes of this dictation, the lowest well-formed intervertebral disc spaces presumed to be the L5-S1 level, and there presumed to be 5 lumbar type vertebral bodies. Vertebral bodies are normally aligned with preservation of the normal lumbar lordosis. Vertebral body heights are well maintained. No fracture or listhesis. Signal intensity within the vertebral body bone marrow is normal. No focal osseous lesions. No marrow edema. Conus medullaris terminates normally at the L1 level. Signal intensity within the visualized cord is normal. Nerve roots of the cauda equina are unremarkable. Paraspinous soft tissues demonstrate no acute abnormality. T10-11:  Negative. T12-L1:  Negative. L1-2:  Negative. L2-3:  Negative interspace.  Minimal facet hypertrophy. L3-4: Disc desiccation with small right paracentral disc protrusion  indents and mildly flattens the right ventral thecal sac without significant stenosis or evidence of neural impingement. Overall, the size of the protruding disc is slightly decreased relative to the previous study P There is an associated annular fissure. Mild ligamentum and facet hypertrophy. Foramina are widely patent. L4-5: Diffuse disc desiccation with shallow right paracentral disc protrusion. The protruding disc minimally indents the right ventral thecal sac without significant stenosis or evidence of neural impingement. Overall, size of the disc is not significantly changed relative to previous. Mild facet and ligamentum flavum hypertrophy. No significant canal or foraminal stenosis. L5-S1: No disc bulge or disc protrusion. Mild bilateral facet arthrosis. No significant canal or foraminal stenosis. IMPRESSION: 1. Stable appearance of the lumbar spine. No evidence of severe stenosis  or cord compression. No new disc herniation. 2. Small right paracentral disc protrusion at L3-4 without stenosis or neural impingement. 3. Small right paracentral disc protrusion at L4-5 without stenosis or evidence of neural impingement. Electronically Signed   By: Jeannine Boga M.D.   On: 11/26/2015 21:26   Mr Cervical Spine W Wo Contrast  11/27/2015  CLINICAL DATA:  Initial evaluation for bilateral upper extremity paresthesias. EXAM: MRI CERVICAL SPINE WITHOUT AND WITH CONTRAST TECHNIQUE: Multiplanar and multiecho pulse sequences of the cervical spine, to include the craniocervical junction and cervicothoracic junction, were obtained according to standard protocol without and with intravenous contrast. CONTRAST:  13m MULTIHANCE GADOBENATE DIMEGLUMINE 529 MG/ML IV SOLN COMPARISON:  Prior study from 12/19/2014. FINDINGS: Visualized portions of the brain and posterior fossa demonstrate a normal appearance with normal signal intensity. Craniocervical junction normal. Vertebral bodies are normally aligned with preservation of the normal cervical lordosis paravertebral body heights are preserved. No fracture or listhesis. Signal intensity within the vertebral body bone marrow is normal. No focal osseous lesion. No abnormal enhancement. Signal intensity within the cervical spinal cord is normal. Paraspinous soft tissues are normal. No prevertebral edema. Maxillary sinus disease noted. C2-C3: Negative. C3-C4:  Negative. C4-C5: Broad-based posterior disc protrusion flattens and mildly effaces the ventral thecal sac without significant stenosis or evidence of neural impingement. Superimposed mild bilateral uncovertebral spurring. Minimal bilateral foraminal narrowing. C5-C6:  Negative. C6-C7: Tiny central disc protrusion without stenosis or evidence of neural impingement. Foramina widely patent. C7-T1:  Negative. Visualized upper thoracic spine is normal. IMPRESSION: 1. No acute abnormality within the cervical  spine. No significant stenosis or cord signal abnormality. 2. Posterior disc protrusion at C4-5 without significant stenosis or evidence of neural impingement. 3. Tiny central disc protrusion at C6-7 without stenosis. Electronically Signed   By: BJeannine BogaM.D.   On: 11/27/2015 01:41    Microbiology: No results found for this or any previous visit (from the past 240 hour(s)).   Labs: Basic Metabolic Panel:  Recent Labs Lab 11/26/15 2011  NA 143  K 4.3  CL 109  GLUCOSE 87  BUN 18  CREATININE 0.60   Liver Function Tests: No results for input(s): AST, ALT, ALKPHOS, BILITOT, PROT, ALBUMIN in the last 168 hours. No results for input(s): LIPASE, AMYLASE in the last 168 hours. No results for input(s): AMMONIA in the last 168 hours. CBC:  Recent Labs Lab 11/26/15 2011 11/27/15 0630  WBC  --  4.7  HGB 11.2* 10.2*  HCT 33.0* 33.5*  MCV  --  89.6  PLT  --  252   Cardiac Enzymes: No results for input(s): CKTOTAL, CKMB, CKMBINDEX, TROPONINI in the last 168 hours. BNP: BNP (last 3 results) No results for input(s): BNP  in the last 8760 hours.  ProBNP (last 3 results) No results for input(s): PROBNP in the last 8760 hours.  CBG: No results for input(s): GLUCAP in the last 168 hours.   Signed:  Charlynne Cousins MD  FACP  [facility] 11/27/2015, 9:33 AM

## 2015-11-27 NOTE — Evaluation (Signed)
Physical Therapy Evaluation Patient Details Name: Penny [name] MRN: [id number] DOB: Dec 27, 1985 Today's Date: 11/27/2015   History of Present Illness  29 year old female with a past medical history significant for migraines, pseudoseizures, anxiety, and depression; who presents with progressively worsening history of falls. Patient notes his symptoms initially started in 2005 after the birth of her first child. She reports having a difficult epidural and delivery complications with a retained placenta. Thereafter within 36 hours she notes the onset of left-sided weakness and seizures. 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 Patient having the seizures regularly for almost 1 year and they had stopped for last 5 months until last weekend where she reports having a pseudo seizure last weekend. And during this time she has had multiple falls due to the left side numbness and reports that she rolled her ankle in October and has had increasingly more falls since that time. Her primary care provider referred her to orthopedics which took several weeks for her to be seen. Once seen by orthopedics and they recommended her to be seen by neurology however her appointment was not until January 10. Patient reports only left leg numbness until today and then reports falling on her left side today and subsequently hitting her head. Thereafter she reported onset of right sided weakness was started distally and started progressing upwards. Reports associated symptoms of tingling sensation and pain of her lower back hips and legs.   Clinical Impression  Pt presents with decreased strength, proprioception, sensation in B LEs, impaired balance and gait. PT and pt/mother discussed at length d/c options, home safety, energy conservation, and PT POC.  Pt wants to go home for Christmas despite PT recommendation for rehab. PT recommends 24 hour assistance which pt's mother says that she and the pt's boyfriend can  provide. PT emphasized the need for safety and recommends pt use w/c for mobility at home until she regains strength to walk safely, pt and mother agree.  Pt will continue to benefit from skilled PT to reduce fall risk and improve strength and balance.    Follow Up Recommendations Home health PT;Supervision/Assistance - 24 hour    Equipment Recommendations  Wheelchair (measurements PT);Wheelchair cushion (measurements PT);Other (comment) (20x18, pt would also like a tub bench)    Recommendations for Other Services OT consult     Precautions / Restrictions Precautions Precautions: Fall Restrictions Weight Bearing Restrictions: No      Mobility  Bed Mobility Overal bed mobility: Needs Assistance Bed Mobility: Supine to Sit     Supine to sit: Max assist Sit to supine: Max assist   General bed mobility comments: needs help with B LEs both in and out of bed, requires use of bed rails to raise trunk, limited by pain  Transfers Overall transfer level: Needs assistance Equipment used: Rolling walker (2 wheeled) Transfers: Sit to/from [facility] Sit to Stand: Min assist Stand pivot transfers: Min assist       General transfer comment: close min A due to pt with fear of falling due to L LE weakness, pt with shuffling gait, incrreased wt on UEs  Ambulation/Gait Ambulation/Gait assistance: Min assist Ambulation Distance (Feet): 10 Feet Assistive device: Rolling walker (2 wheeled)     Gait velocity interpretation: <1.8 ft/sec, indicative of risk for recurrent falls General Gait Details: PT stopped gait after 10' due to pt with L ankle inversion and PT fear of twisting or injuring ankle. Pt with slight scissoring of L LE during gait  Stairs  Wheelchair Mobility    Modified Rankin (Stroke Patients Only)       Balance Overall balance assessment: Needs assistance Sitting-balance support: Bilateral upper extremity supported Sitting  balance-Leahy Scale: Fair Sitting balance - Comments: pt able to sit EOB x 30 minutes for testing and discussion/education with PT, limited by pain   Standing balance support: Bilateral upper extremity supported Standing balance-Leahy Scale: Poor Standing balance comment: min A for standing balance                             Pertinent Vitals/Pain Pain Assessment: Faces Faces Pain Scale: Hurts whole lot Pain Location: hips and lower back Pain Descriptors / Indicators: Sharp;Stabbing Pain Intervention(s): Limited activity within patient's tolerance;Monitored during session;Premedicated before session;Repositioned    Home Living Family/patient expects to be discharged to:: Private residence Living Arrangements: Spouse/significant other;Children Available Help at Discharge: Family;Available 24 hours/day Type of Home: House Home Access: Stairs to enter Entrance Stairs-Rails: Chemical engineer of Steps: 3 Home Layout: One level Home Equipment: Walker - 2 wheels;Walker - 4 wheels Additional Comments: pt bought at rollator 2 weeks ago due to increasing weakness    Prior Function Level of Independence: Independent with assistive device(s)         Comments: using rollator x 2 weeks, multiple falls     Hand Dominance        Extremity/Trunk Assessment   Upper Extremity Assessment: Generalized weakness           Lower Extremity Assessment: RLE deficits/detail;LLE deficits/detail RLE Deficits / Details: strength grossly 3-/5 LLE Deficits / Details: ankle DF 0/5, knee ext 1/5, hip flex 1/5  Cervical / Trunk Assessment: Normal  Communication   Communication: No difficulties  Cognition Arousal/Alertness: Awake/alert Behavior During Therapy: WFL for tasks assessed/performed Overall Cognitive Status: Within Functional Limits for tasks assessed                      General Comments      Exercises General Exercises - Lower  Extremity Ankle Circles/Pumps: AAROM;Both;5 reps Short Arc Quad: AAROM;Both;5 reps Heel Slides: AAROM;Both;5 reps Hip ABduction/ADduction: AAROM;Both;5 reps      Assessment/Plan    PT Assessment Patient needs continued PT services  PT Diagnosis Difficulty walking;Generalized weakness;Acute pain   PT Problem List Decreased strength;Decreased activity tolerance;Decreased balance;Decreased mobility;Impaired sensation;Decreased knowledge of use of DME;Pain  PT Treatment Interventions DME instruction;Balance training;Modalities;Neuromuscular re-education;Gait training;Stair training;Functional mobility training;Patient/family education;Wheelchair mobility training;Therapeutic activities;Therapeutic exercise   PT Goals (Current goals can be found in the Care Plan section) Acute Rehab PT Goals Patient Stated Goal: go home for Christmas PT Goal Formulation: With patient/family Time For Goal Achievement: 12/11/15 Potential to Achieve Goals: Good    Frequency Min 4X/week   Barriers to discharge Inaccessible home environment      Co-evaluation               End of Session Equipment Utilized During Treatment: Gait belt Activity Tolerance: Patient limited by fatigue;Patient limited by pain Patient left: in bed;with family/visitor present;with call bell/phone within reach;with bed alarm set Nurse Communication: Mobility status    Functional Limitation: Mobility: Walking and moving around Mobility: Walking and Moving Around Current Status [id number]): At least 20 percent but less than 40 percent impaired, limited or restricted Mobility: Walking and Moving Around Goal Status [phone]): At least 1 percent but less than 20 percent impaired, limited or restricted    Time: 0900-0957 PT Time Calculation (  min) (ACUTE ONLY): 57 min   Charges:   PT Evaluation $Initial PT Evaluation Tier I: 1 Procedure PT Treatments $Therapeutic Exercise: 8-22 mins $Therapeutic Activity: 38-52 mins   PT G  Codes:   PT G-Codes **NOT FOR INPATIENT CLASS** Functional Limitation: Mobility: Walking and moving around Mobility: Walking and Moving Around Current Status [id number]): At least 20 percent but less than 40 percent impaired, limited or restricted Mobility: Walking and Moving Around Goal Status [phone]): At least 1 percent but less than 20 percent impaired, limited or restricted    [facility] 11/27/2015, 10:05 AM

## 2015-11-27 NOTE — Consult Note (Signed)
Short Note:  MRI C-spine negative   To follow as outpatient with neurology  Please call back with any questions

## 2015-11-27 NOTE — Care Management Note (Addendum)
Case Management Note  Patient Details  Name: EFFIE DIBLASIO MRN: [id number] Date of Birth: 09/05/1986  Subjective/Objective:     29 y.o. F admitted 11/26/2015 with Bilateral Leg numbness.  Hx Migraines, Pseudoseizures who experienced a fall. PT evaluated pt and recommended HHPT, W/C with 20 x 18 Cushion and Tub Bench. Will order through [facility].               Action/Plan: Anticipate discharge home today. No further CM needs but will be available should additional discharge needs arise.   Expected Discharge Date:                  Expected Discharge Plan:     In-House Referral:     Discharge planning Services     Post Acute Care Choice:    Choice offered to:     DME Arranged:    DME Agency:     HH Arranged:    [place] Agency:     Status of Service:     Medicare Important Message Given:    Date Medicare IM Given:    Medicare IM give by:    Date Additional Medicare IM Given:    Additional Medicare Important Message give by:     If discussed at [place] of Stay Meetings, dates discussed:    Additional Comments:  Delrae Sawyers, RN 11/27/2015, 10:20 AM

## 2015-11-27 NOTE — Care Management Note (Addendum)
Case Management Note  Patient Details  Name: Penny [name] MRN: [id number] Date of Birth: 04/15/1986  Subjective/Objective:   29 y.o. F admitted with Hx Pseudo Seizures, multiple falls and most recently fall with subsequent head injury. MRI pending today prior to discharge. Will be followed by Neurology in January. Mother willing to provide supervision in the home as pt has poor gait, weakness and falls frequently. Wheelchair with cushion and tub bench will be provided by [facility], [place]ills).Not eligible for HHPT as Medicaid recipient.                   Action/Plan:  Anticipate discharge home today. No further CM needs but will be available should additional discharge needs arise.   Expected Discharge Date:                  Expected Discharge Plan:     In-House Referral:     Discharge planning Services  CM Consult  Post Acute Care Choice:  Durable Medical Equipment Choice offered to:     DME Arranged:  Tub bench, Wheelchair manual, Other see comment (Cushion ) DME Agency:  [place]:    [place] Agency:     Status of Service:     Medicare Important Message Given:    Date Medicare IM Given:    Medicare IM give by:    Date Additional Medicare IM Given:    Additional Medicare Important Message give by:     If discussed at [place] of Stay Meetings, dates discussed:    Additional Comments:  Delrae Sawyers, RN 11/27/2015, 10:49 AM

## 2015-11-27 NOTE — Progress Notes (Signed)
Penny [name] to be D/C'd Home per MD order.  Discussed with the patient and all questions fully answered.  VSS, Skin clean, dry and intact without evidence of skin break down, no evidence of skin tears noted. IV catheter discontinued intact. Site without signs and symptoms of complications. Dressing and pressure applied.  An After Visit Summary was printed and given to the patient. Patient received prescription.  D/c education completed with patient/family including follow up instructions, medication list, d/c activities limitations if indicated, with other d/c instructions as indicated by MD - patient able to verbalize understanding, all questions fully answered.   Patient instructed to return to ED, call 911, or call MD for any changes in condition.   Patient escorted via Walker, and D/C home via private auto.  Malcolm Metro 11/27/2015 5:03 PM

## 2015-11-27 NOTE — Progress Notes (Signed)
Pharmacist Provided - Patient Medication Education Prior to Discharge   Penny [name] is an 29 y.o. female who presented to [facility] on 11/26/2015 with a chief complaint of  Chief Complaint  Patient presents with  . Fall  . Numbness  . Head Injury     [x]  Patient will be discharged with new medications []  Patient being discharged without any new medications  The following medications were discussed with the patient:  Pain Control medications: [x]  Yes    []  No  Diabetes Medications: []  Yes    [x]  No  Heart Failure Medications: []  Yes    [x]  No  Anticoagulation Medications:  []  Yes    [x]  No  Antibiotics at discharge: []  Yes    [x]  No  Allergy Assessment Completed and Updated: [x]  Yes    []  No Identified Patient Allergies:  Allergies  Allergen Reactions  . Gabapentin Other (See Comments)    Pt states that she was unresponsive after taking this medication, but her vitals remained stable.    . Metoclopramide Hcl Anxiety and Other (See Comments)    Pt states that she felt like she was trapped in a box, and could not get out.  Pt also states that she had temporary loss of movement, weakness, and tingling.    . Codeine Nausea And Vomiting    Pt reports that she takes this med with no problems now EMO RN  . Zofran [Ondansetron] Other (See Comments)    Migraines   . Morphine And Related     Chest pain   . Tramadol     Seizures  . Latex Rash  . Tape Itching and Rash    Please use paper tape     Medication Adherence Assessment: []  Excellent (no doses missed/week)      []  Good (1 dose missed/week)      [x]  Partial (2-3 doses missed/week)      []  Poor (>3 doses missed/week)  Barriers to Obtaining Medications: []  Yes [x]  No   Assessment: I spoke with this patient about her potential discharge medications. We spent the majority of time talking about her seizure medications. She states that she had not been taking her carbamazepine for 2 weeks prior to this most recent seizure  and hospitalization (said that the medication bottle was misplaced on the ambulance during last hospital admission transport). I spoke with her about the importance of taking this medication. We also discussed the cymbalta that she is currently taking for neuropathic pain in her lower extremities. She states that the cymbalta is not working and that she previously tried gabapentin (too sedating) and lyrica (weight gain) without positive effects. She has a follow-up appointment with her neurologist on 12/16/15. I recommended that she discuss her current regimen failures with the physician at that visit. She was also complaining of pain and asked if she was going to be prescribed pain upon discharge. I informed her that her discharge summary had not been completed and that I was unaware of what her exact discharge pain regimen would be. She stated that the lidocaine patches that she has been receiving here in the hospital have been relieving her pain. She did not have any further medication related questions.   Time spent preparing for discharge counseling: 15 min  Time spent counseling patient: 15 min    Tobie Hellen C. Lennox Grumbles, PharmD Pharmacy Resident  Pager: [phone] 11/27/2015 2:10 PM

## 2015-11-27 NOTE — H&P (Signed)
[facility] History and Physical  Penny [name] OVZ:[id number] DOB: 30-Sep-1986 DOA: 11/26/2015  Referring physician: ED PCP: [name], MD   Chief Complaint: Bilateral lower extremity weakness  HPI:  [name] is a 29 year old female with a past medical history significant for migraines, pseudoseizures, anxiety, and depression; who presents with progressively worsening history of falls. Patient notes his symptoms initially started in 2005 after the birth of her first child. She reports having a difficult epidural and delivery complications with a retained placenta. Thereafter within 36 hours she notes the onset of left-sided weakness and seizures. 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 Patient having the seizures regularly for almost 1 year and they had stopped for last 5 months until last weekend where she reports having a pseudo seizure last weekend. And during this time she has had multiple falls due to the left side numbness and reports that she rolled her ankle in October and has had increasingly more falls since that time. Her primary care provider referred her to orthopedics which took several weeks for her to be seen. Once seen by orthopedics and they recommended her to be seen by neurology however her appointment was not until January 10. Patient reports only left leg numbness until today and then reports falling on her left side today and subsequently hitting her head. Thereafter she reported onset of right sided weakness was started distally and started progressing upwards. Reports associated symptoms of tingling sensation and pain of her lower back hips and legs. She is currently on hydrocodone to treat pain symptoms. Patient's mother also notes a history of patient's paternal brother having MS and dying of something that was similar to [place] gerhrig's disease. In the emergency department the patient had a MRI of the lumbar spine which did not show any acute  abnormalities. Neurology was consulted and the patient was recommended to have an MRI of the C-spine.      Review of Systems  Constitutional: Positive for malaise/fatigue. Negative for chills and weight loss.  HENT: Positive for hearing loss. Negative for ear pain.   Eyes: Positive for blurred vision. Negative for pain.  Respiratory: Negative for hemoptysis and wheezing.   Cardiovascular: Negative for palpitations and orthopnea.  Gastrointestinal: Negative for abdominal pain and diarrhea.  Genitourinary: Positive for dysuria.       Incontinence  Musculoskeletal: Positive for back pain, joint pain and falls.  Skin: Negative for itching and rash.  Neurological: Positive for tingling, sensory change, focal weakness and seizures. Negative for loss of consciousness.  Psychiatric/Behavioral: Negative for suicidal ideas. The patient is nervous/anxious.        Past Medical History  Diagnosis Date  . Migraines   . Interstitial cystitis   . IUD FEB 2010  . Gastritis JULY 2011  . Depression   . 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 Fatty liver   . Obesity (BMI 30-39.9) 2011 228 LBS BMI 36.8  . Iron deficiency anemia 07/23/2010  . Irritable bowel syndrome 2012 DIARRHEA    JUN 2012 TTG IgA 14.9  . Elevated liver enzymes JUL 2011ALK PHOS 111-127 AST  143-267 ALT  213-321T BILI 0.6  ALB  3.7-4.06 Jun 2011 ALK PHOS 118 AST 24 ALT 42 T BILI 0.4 ALB 3.9  . Chronic daily headache   . Ovarian cyst   . ADD (attention deficit disorder)   . RLQ abdominal pain 07/18/2013  . Menorrhagia 07/18/2013  . Anxiety   .  Potassium (K) deficiency   . Dysmenorrhea 09/18/2013  . Stress 09/18/2013  . Patient desires pregnancy 09/18/2013  . Pregnant 12/25/2013  . Blood transfusion without reported diagnosis   . Heart murmur   . Gastric bypass status for obesity   . 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 Dysrhythmia   . Sciatica of left side 11/14/2014  . Fibromyalgia   . Hereditary and  idiopathic peripheral neuropathy 01/15/2015  . PONV (postoperative nausea and vomiting) seizure post-operatively  . Psychiatric pseudoseizure   . Depression      Past Surgical History  Procedure Laterality Date  . Gab  2007    in [place]-POUCH 5 CM  . Cholecystectomy  2005    biliary dyskinesia  . Cath self every nite      for sodium bicarb injection (discontinued 2013)  . Colonoscopy  JUN 2012 ABD PN/DIARRHEA WITH PROPOFOL    NL COLON  . Upper gastrointestinal endoscopy  JULY 2011 NAUSEA-D125,V6, PH 25    Bx; GASTRITIS, POUCH-5 CM LONG  . Dilation and curettage of uterus    . Tonsillectomy    . Gastric bypass  06/2006  . Savory dilation  06/20/2012    Dr. Barnie Alderman gastritis/Ulcer in the mid jejunum. Empiric dilation.   . Tonsillectomy and adenoidectomy    . Esophagogastroduodenoscopy    . Wisdom tooth extraction    . Repair vaginal cuff N/A 07/30/2014    Procedure: REPAIR VAGINAL CUFF;  Surgeon: Mora Bellman, MD;  Location: [facility];  Service: Gynecology;  Laterality: N/A;  . Hysteroscopy w/d&c N/A 09/12/2014    Procedure: DILATATION AND CURETTAGE /HYSTEROSCOPY;  Surgeon: Jonnie Kind, MD;  Location: [facility];  Service: Gynecology;  Laterality: N/A;  . Esophagogastroduodenoscopy (egd) with propofol N/A 04/10/2015    Procedure: ESOPHAGOGASTRODUODENOSCOPY (EGD) WITH PROPOFOL;  Surgeon: Daneil Dolin, MD;  Location: [facility];  Service: Endoscopy;  Laterality: N/A;  . Esophageal dilation N/A 04/10/2015    Procedure: ESOPHAGEAL DILATION WITH 54FR MALONEY DILATOR;  Surgeon: Daneil Dolin, MD;  Location: [facility];  Service: Endoscopy;  Laterality: N/A;  . Esophageal biopsy  04/10/2015    Procedure: BIOPSY;  Surgeon: Daneil Dolin, MD;  Location: [facility];  Service: Endoscopy;;      Social History:  reports that she quit smoking about 3 months ago. Her smoking use included Cigarettes. She has a 1.5 pack-year smoking history. She has never used smokeless tobacco. She reports that she  drinks alcohol. She reports that she does not use illicit drugs. Where does patient live--home   Can patient participate in ADLs? Yes  Allergies  Allergen Reactions  . Gabapentin Other (See Comments)    Pt states that she was unresponsive after taking this medication, but her vitals remained stable.    . Metoclopramide Hcl Anxiety and Other (See Comments)    Pt states that she felt like she was trapped in a box, and could not get out.  Pt also states that she had temporary loss of movement, weakness, and tingling.    . Codeine Nausea And Vomiting    Pt reports that she takes this med with no problems now EMO RN  . Zofran [Ondansetron] Other (See Comments)    Migraines   . Morphine And Related     Chest pain   . Tramadol     Seizures  . Latex Rash  . Tape Itching and Rash    Please use paper tape    Family History  Problem Relation Age of Onset  . Hemochromatosis Maternal Grandmother   . Migraines Maternal Grandmother   . Cancer Maternal Grandmother   . Breast cancer Maternal Grandmother   . Hypertension Father   . Diabetes Father   . Coronary artery disease Father   . Migraines Paternal Grandmother   . Breast cancer Paternal Grandmother   . Cancer Mother     breast  . Hemochromatosis Mother   . Breast cancer Mother   . Depression Mother   . Anxiety disorder Mother   . Coronary artery disease Paternal Grandfather   . Anxiety disorder Brother   . Bipolar disorder Brother        Prior to Admission medications   Medication Sig Start Date End Date Taking? Authorizing Provider  amphetamine-dextroamphetamine (ADDERALL XR) 30 MG 24 hr capsule Take 1 capsule (30 mg total) by mouth daily. 10/15/15  Yes Cloria Spring, MD  APAP-Pamabrom-Pyrilamine (MENSTRUAL PAIN RELIEF) 500-25-15 MG TABS Take 1-2 tablets by mouth daily as needed (for pain).   Yes Historical Provider, MD  carbamazepine (TEGRETOL) 200 MG tablet Take two tablets at bedtime Patient taking differently: Take 400  mg by mouth at bedtime. Take two tablets at bedtime 10/15/15  Yes Cloria Spring, MD  clonazePAM (KLONOPIN) 0.5 MG tablet Take 1 tablet (0.5 mg total) by mouth 3 (three) times daily as needed for anxiety. 10/17/15 10/16/16 Yes Cloria Spring, MD  DULoxetine (CYMBALTA) 60 MG capsule Take 1 capsule (60 mg total) by mouth daily. 10/15/15  Yes Cloria Spring, MD  HYDROcodone-acetaminophen (NORCO/VICODIN) 5-325 MG tablet Take 1 tablet by mouth every 6 (six) hours as needed. 11/20/15  Yes Kathyrn Drown, MD  phenazopyridine (PYRIDIUM) 200 MG tablet Take 1 tablet (200 mg total) by mouth 3 (three) times daily with meals. X 7 days Patient taking differently: Take 200 mg by mouth 3 (three) times daily as needed for pain. X 7 days 09/18/15  Yes Kathyrn Drown, MD  promethazine (PHENERGAN) 25 MG tablet Take 1 tablet (25 mg total) by mouth every 6 (six) hours as needed for nausea or vomiting. 06/11/15  Yes Ezequiel Essex, MD  tiZANidine (ZANAFLEX) 4 MG tablet TAKE ONE TABLET BY MOUTH THREE TIMES DAILY AS NEEDED FOR MUSCLE SPASMS. 11/17/15  Yes Kathyrn Drown, MD  topiramate (TOPAMAX) 50 MG tablet Take 50 mg by mouth See admin instructions. Take 33m in the am [id number]   Yes Historical Provider, MD  traZODone (DESYREL) 150 MG tablet Take 1 tablet (150 mg total) by mouth at bedtime as needed for sleep. 10/15/15  Yes DeCloria SpringMD  doxycycline (VIBRAMYCIN) 100 MG capsule Take 1 capsule (100 mg total) by mouth 2 (two) times daily. Patient not taking: Reported on 11/26/2015 10/07/15   ScKathyrn DrownMD  fluconazole (DIFLUCAN) 150 MG tablet Take 1 tablet (150 mg total) by mouth once. Patient not taking: Reported on 11/26/2015 10/07/15   ScKathyrn DrownMD  ibuprofen (ADVIL,MOTRIN) 800 MG tablet Take 1 tablet (800 mg total) by mouth every 8 (eight) hours as needed for mild pain. Patient not taking: Reported on 11/26/2015 10/20/15   Kristen N Ward, DO  naproxen (NAPROSYN) 500 MG tablet Take 1 tablet (500 mg total) by  mouth 2 (two) times daily. Patient not taking: Reported on 11/26/2015 10/18/15   HoAshley MurrainNP     Physical Exam: Filed Vitals:   11/26/15 1945 11/26/15 2125 11/26/15 2126 11/26/15 2245  BP: 113/68 110/66  113/64  Pulse: 75  70 61  Resp: 16     Height:      Weight:      SpO2: 98%  94% 97%     Constitutional: Vital signs reviewed. Patient is tearful throughout history and exam and appears in some mild distress.. Alert and oriented x3.  Head: Normocephalic and atraumatic  Ear: TM normal bilaterally  Mouth: no erythema or exudates, MMM  Eyes: PERRL, EOMI, conjunctivae normal, No scleral icterus.  Neck: Supple, Trachea midline normal ROM, No JVD, mass, thyromegaly, or carotid bruit present.  Cardiovascular: RRR, S1 normal, S2 normal, no MRG, pulses symmetric and intact bilaterally  Pulmonary/Chest: CTAB, no wheezes, rales, or rhonchi  Abdominal: Soft. Non-tender, non-distended, bowel sounds are normal, no masses, organomegaly, or guarding present.  GU: no CVA tenderness Musculoskeletal: No joint deformities, erythema, or stiffness, tenderness to palpation of the bilateral knees, but no obvious joint laxity noted  Ext: no edema and no cyanosis, pulses palpable bilaterally (DP and PT)  Hematology: no cervical, inginal, or axillary adenopathy.  Neurological: A&O x3, reflexes are 1+ and symmetric. Patient has decreased sensation to light touch along the left and right lower extremities. Strength cannot be fully tested for at this time. Skin: Warm, dry and intact. No rash, cyanosis, or clubbing.  Psychiatric: Mood and affect appear sad/depressed. Speech is normal. Judgment and thought content normal. Cognition and memory are normal.      Data Review   Micro Results No results found for this or any previous visit (from the past 240 hour(s)).  Radiology Reports Ct Head Wo Contrast  11/15/2015  CLINICAL DATA:  Fall, hit head on dresser history of seizures EXAM: CT HEAD WITHOUT  CONTRAST TECHNIQUE: Contiguous axial images were obtained from the base of the skull through the vertex without intravenous contrast. COMPARISON:  05/09/2015 FINDINGS: No skull fracture is noted. Again noted mild mucosal thickening and tiny air-fluid level in right maxillary sinus. The mastoid air cells are unremarkable. No intracranial hemorrhage, mass effect or midline shift. No acute cortical infarction. No mass lesion is noted on this unenhanced scan. There are motion artifacts images of the high convexity vertex. No hydrocephalus. IMPRESSION: No acute intracranial abnormality. Motion artifacts are noted last images high convexity vertex. Again noticed small mucosal thickening in right maxillary sinus. Electronically Signed   By: Lahoma Crocker M.D.   On: 11/15/2015 17:34   Mr Lumbar Spine Wo Contrast  11/26/2015  CLINICAL DATA:  Initial evaluation for bilateral lower extremity numbness. EXAM: MRI LUMBAR SPINE WITHOUT CONTRAST TECHNIQUE: Multiplanar, multisequence MR imaging of the lumbar spine was performed. No intravenous contrast was administered. COMPARISON:  Prior study from 12/30/2014. FINDINGS: For the purposes of this dictation, the lowest well-formed intervertebral disc spaces presumed to be the L5-S1 level, and there presumed to be 5 lumbar type vertebral bodies. Vertebral bodies are normally aligned with preservation of the normal lumbar lordosis. Vertebral body heights are well maintained. No fracture or listhesis. Signal intensity within the vertebral body bone marrow is normal. No focal osseous lesions. No marrow edema. Conus medullaris terminates normally at the L1 level. Signal intensity within the visualized cord is normal. Nerve roots of the cauda equina are unremarkable. Paraspinous soft tissues demonstrate no acute abnormality. T10-11:  Negative. T12-L1:  Negative. L1-2:  Negative. L2-3:  Negative interspace.  Minimal facet hypertrophy. L3-4: Disc desiccation with small right paracentral disc  protrusion indents and mildly flattens the right ventral thecal sac without significant stenosis or evidence of neural impingement. Overall,  the size of the protruding disc is slightly decreased relative to the previous study P There is an associated annular fissure. Mild ligamentum and facet hypertrophy. Foramina are widely patent. L4-5: Diffuse disc desiccation with shallow right paracentral disc protrusion. The protruding disc minimally indents the right ventral thecal sac without significant stenosis or evidence of neural impingement. Overall, size of the disc is not significantly changed relative to previous. Mild facet and ligamentum flavum hypertrophy. No significant canal or foraminal stenosis. L5-S1: No disc bulge or disc protrusion. Mild bilateral facet arthrosis. No significant canal or foraminal stenosis. IMPRESSION: 1. Stable appearance of the lumbar spine. No evidence of severe stenosis or cord compression. No new disc herniation. 2. Small right paracentral disc protrusion at L3-4 without stenosis or neural impingement. 3. Small right paracentral disc protrusion at L4-5 without stenosis or evidence of neural impingement. Electronically Signed   By: Jeannine Boga M.D.   On: 11/26/2015 21:26     CBC  Recent Labs Lab 11/26/15 2011  HGB 11.2*  HCT 33.0*    Chemistries   Recent Labs Lab 11/26/15 2011  NA 143  K 4.3  CL 109  GLUCOSE 87  BUN 18  CREATININE 0.60   ------------------------------------------------------------------------------------------------------------------ estimated creatinine clearance is 118.8 mL/min (by C-G formula based on Cr of 0.6). ------------------------------------------------------------------------------------------------------------------ No results for input(s): HGBA1C in the last 72 hours. ------------------------------------------------------------------------------------------------------------------ No results for input(s): CHOL, HDL,  LDLCALC, TRIG, CHOLHDL, LDLDIRECT in the last 72 hours. ------------------------------------------------------------------------------------------------------------------ No results for input(s): TSH, T4TOTAL, T3FREE, THYROIDAB in the last 72 hours.  Invalid input(s): FREET3 ------------------------------------------------------------------------------------------------------------------ No results for input(s): VITAMINB12, FOLATE, FERRITIN, TIBC, IRON, RETICCTPCT in the last 72 hours.  Coagulation profile No results for input(s): INR, PROTIME in the last 168 hours.  No results for input(s): DDIMER in the last 72 hours.  Cardiac Enzymes No results for input(s): CKMB, TROPONINI, MYOGLOBIN in the last 168 hours.  Invalid input(s): CK ------------------------------------------------------------------------------------------------------------------ Invalid input(s): POCBNP   CBG: No results for input(s): GLUCAP in the last 168 hours.        Assessment/Plan Active Problems:   Depression with anxiety   ADD (attention deficit disorder) without hyperactivity   Bilateral leg numbness  Bilateral leg numbness: Exact cause of the patient's symptoms is unknown at this time low suspicion for GBS. Differential includes abnormality of spine versus MS versus  psychiatric(conversion disorder) versus possible adverse reaction with medications versus other. - Neurology consulted follow-up recommendations - Check vitamin B12, ESR and CRP - Follow-up MRI of cervical spine - Physical therapy to eval and treat  Iron deficiency anemia - Continue to monitor  Urinary incontinence: Patient reports intermittent incontinence.  -Checking a urinalysis  History of ADHD -Continue patient's medications of Adderall   Anxiety and depression -Continue home medications of Cymbalta, Klonopin,  History of pseudoseizures -Continue Tegretol ? Check level    Code Status:   full Family Communication:  bedside Disposition Plan: admit   Total time spent 55 minutes.Greater than 50% of this time was spent in counseling, explanation of diagnosis, planning of further management, and coordination of care  [facility] Pager [phone]  If 7PM-7AM, please contact night-coverage [email or web address] Password [email or web address] 11/27/2015, 12:10 AM

## 2015-11-27 NOTE — Progress Notes (Signed)
Patient now asking for something topical to help with left hip pain.  Will page MD

## 2015-11-27 NOTE — Progress Notes (Signed)
Pt complaining of pain. Paged Dr Venetia Constable for tylenol orders.

## 2015-12-02 ENCOUNTER — Telehealth: Payer: Self-pay | Admitting: Family Medicine

## 2015-12-02 ENCOUNTER — Other Ambulatory Visit: Payer: Self-pay | Admitting: *Deleted

## 2015-12-02 MED ORDER — HYDROCODONE-ACETAMINOPHEN 5-325 MG PO TABS: 1.0000 | ORAL_TABLET | Freq: Three times a day (TID) | ORAL | Status: DC | PRN

## 2015-12-02 NOTE — Telephone Encounter (Signed)
I do not recommend ongoing pain medicines. We prescribed pain medication the other day with the understanding that this would not be for frequent use. In addition to this the patient was evaluated in the hospital by neurology and had additional MRIs which were normal. The tests did not show any particular reason for her symptomatology. It is important for her to see neurology for further evaluation. The patient must keep her appointments for her visit with the neurologist for nerve conduction studies. If her EMG/nerve conduction studies are normal then ongoing medication will not be necessary at all. If the patient is out of pain medicine she may have 20 additional tablets, hydrocodone 5/325-1 every 8 hours when necessary pain caution drowsiness not for frequent use-to use occasionally. In addition to the EMG nerve conduction study please have Izora Ribas make sure that the patient is scheduled for a consult with neurology as well.

## 2015-12-02 NOTE — Telephone Encounter (Signed)
Patient says that when she was discharged from the hospital, she was told to contact Dr. Nicki Reaper back about the pain medication since he is her PCP.  Please advise.

## 2015-12-03 NOTE — Telephone Encounter (Signed)
#  1 I doubt her pain is coming from plantar warts #2 from a referral perspective this patient will need appointment with neurology she has it EMG on January 10 but the EMG is for the test alone not for consultation. Please schedule office visit with neurology as well to see Dr. Posey Pronto for consultation regarding the EMG. This patient has a tendency to have significant health issues that are often unexplained by medical tests. In order to be proactive we need not only an EMG but we also need neurology consultation.

## 2015-12-03 NOTE — Telephone Encounter (Signed)
appt jan 10th with neurology

## 2015-12-03 NOTE — Telephone Encounter (Signed)
Discussed with pt. Script ready for pick up. Pt wanted to know if you thought the pain she is having could be coming from plantar's warts she had while she was pregnant. She states the pain is in same foot and mostly on heel and in calf. Pt aware you are out of office today and will get a call back tomorrow.

## 2015-12-04 NOTE — Telephone Encounter (Signed)
[facility] 12/04/15

## 2015-12-05 ENCOUNTER — Ambulatory Visit (HOSPITAL_COMMUNITY): Payer: Medicaid Other

## 2015-12-09 ENCOUNTER — Telehealth: Payer: Self-pay | Admitting: Family Medicine

## 2015-12-09 DIAGNOSIS — R29898 Other symptoms and signs involving the musculoskeletal system: Secondary | ICD-10-CM

## 2015-12-09 NOTE — Telephone Encounter (Signed)
[facility] (Patient has appointment with Dr. Tomi Likens on 01/02/16 at 10:00 am for consultation)

## 2015-12-09 NOTE — Telephone Encounter (Signed)
Patient was notified that appointment with Dr. Tomi Likens on 01/02/16 at 10:00 am for consultation. Patient verbalized understanding.

## 2015-12-09 NOTE — Telephone Encounter (Signed)
I do not feel comfortable doing that because of the risk of accidental sedation and interaction with pain medicines she needs to pick one muscle relaxer to use when necessary and only use sparingly I do not feel comfortable having to muscle relaxers been prescribed at the same time even though she takes them as she has stated

## 2015-12-09 NOTE — Telephone Encounter (Signed)
[facility]

## 2015-12-09 NOTE — Telephone Encounter (Signed)
Pt called stating that she has flexerill on file at [facility] and they are not wanting to fill it due to her tizanidine. Pt states that Dr. Nicki Reaper has had to tell the pharmacy in the past that she takes the flexerill in the morning and the tizaniidine at night. Pt is wanting Dr Nicki Reaper to do the same today if possible.

## 2015-12-09 NOTE — Telephone Encounter (Signed)
Notified patient she has an appointment with Dr. Tomi Likens on 01/02/16 at 10:00 am for consultation. Patient verbalized understanding.

## 2015-12-09 NOTE — Telephone Encounter (Signed)
Discussed with patient. Patient advised Dr Nicki Reaper does not feel comfortable doing that because of the risk of accidental sedation and interaction with pain medicines she needs to pick one muscle relaxer to use when necessary and only use sparingly Dr Nicki Reaper does not feel comfortable having to muscle relaxers been prescribed at the same time even though she takes them as she has stated. Dr Nicki Reaper wants the patient to stick with the zanaflex as prescribed. Patient verbalized understanding.

## 2015-12-09 NOTE — Telephone Encounter (Signed)
Pt called stating that she was under the impression that when she goes for the nerve conduction study that she would be seeing a Dr. Abbott Pao wants to know how long is it ok for her not to have any feeling in her leg and continue to fall. Pt states that she will not be seeing a Dr when she has the study done. Pt is wanting someone to call her back on this.

## 2015-12-10 IMAGING — DX DG HIP (WITH OR WITHOUT PELVIS) 2-3V*L*
3 series · 3 of 3 positions shown · non-contrast
Comparison: None.

CLINICAL DATA: Fall 2 weeks prior, another fall last week. Now with
pain.

EXAM:
DG HIP (WITH OR WITHOUT PELVIS) 2-3V LEFT

[pelvis ap]
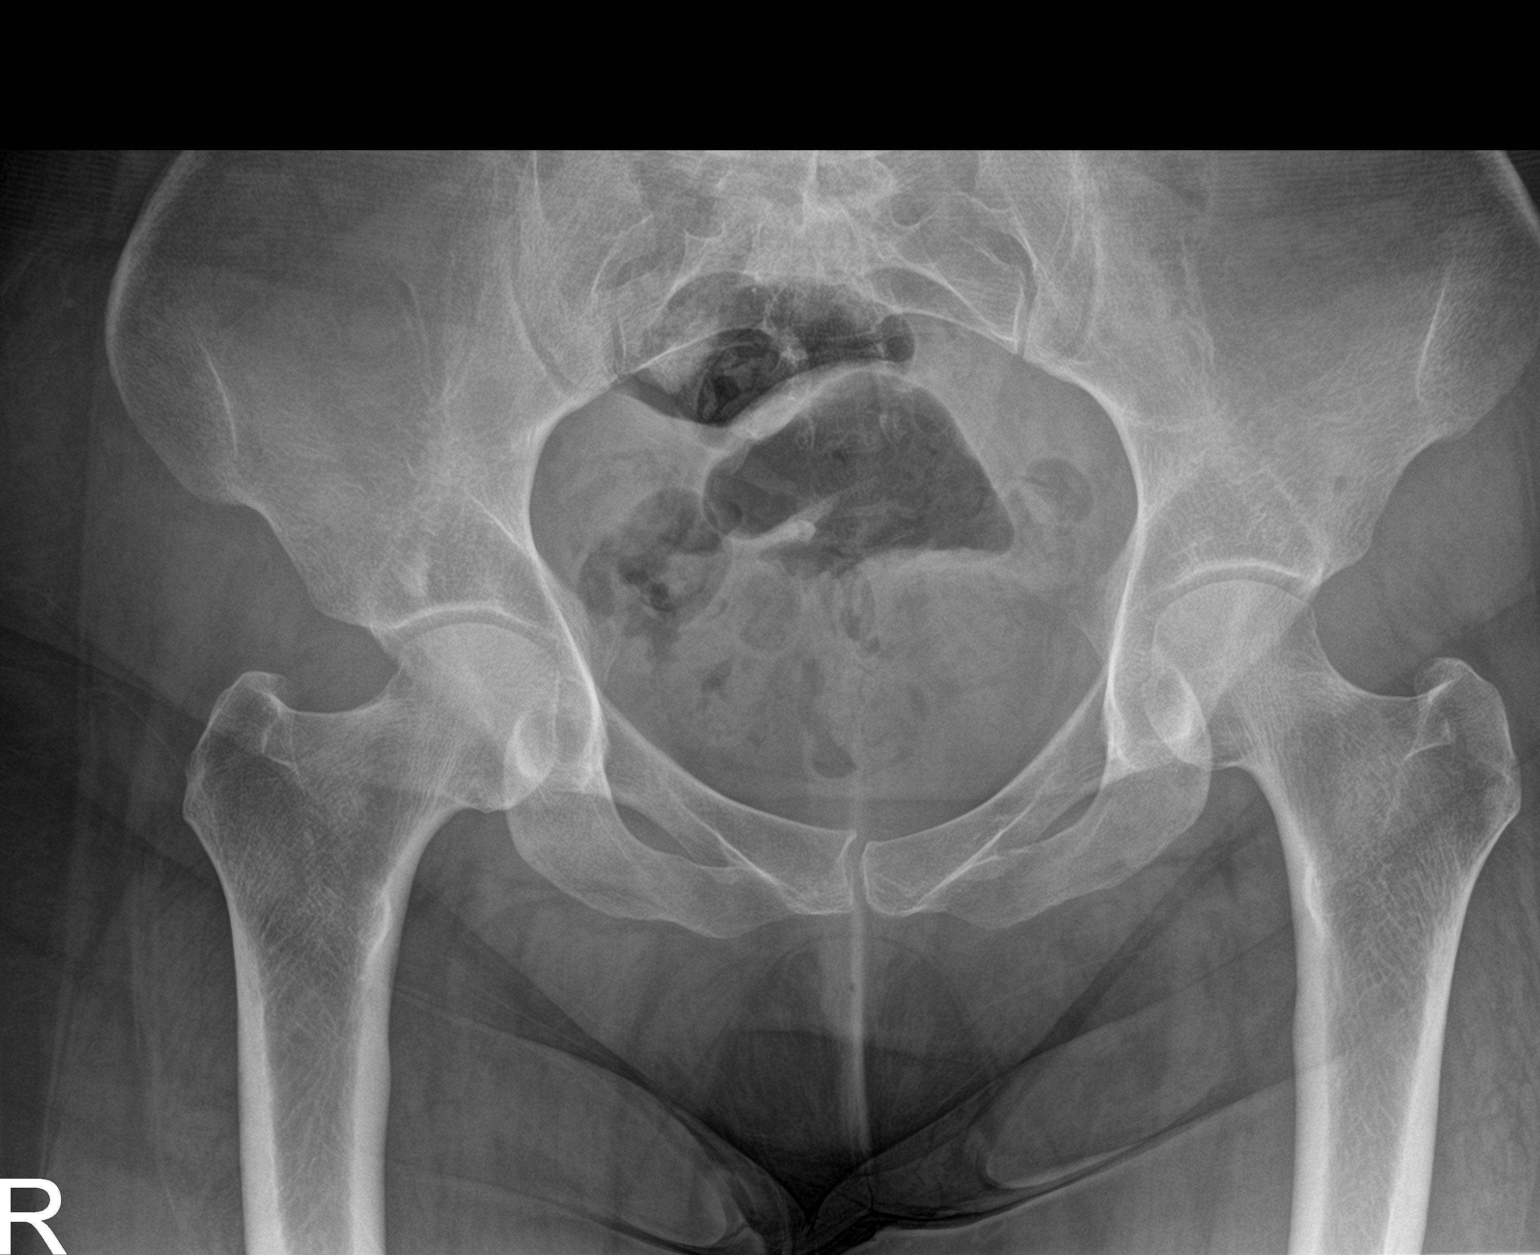

[hip ap]
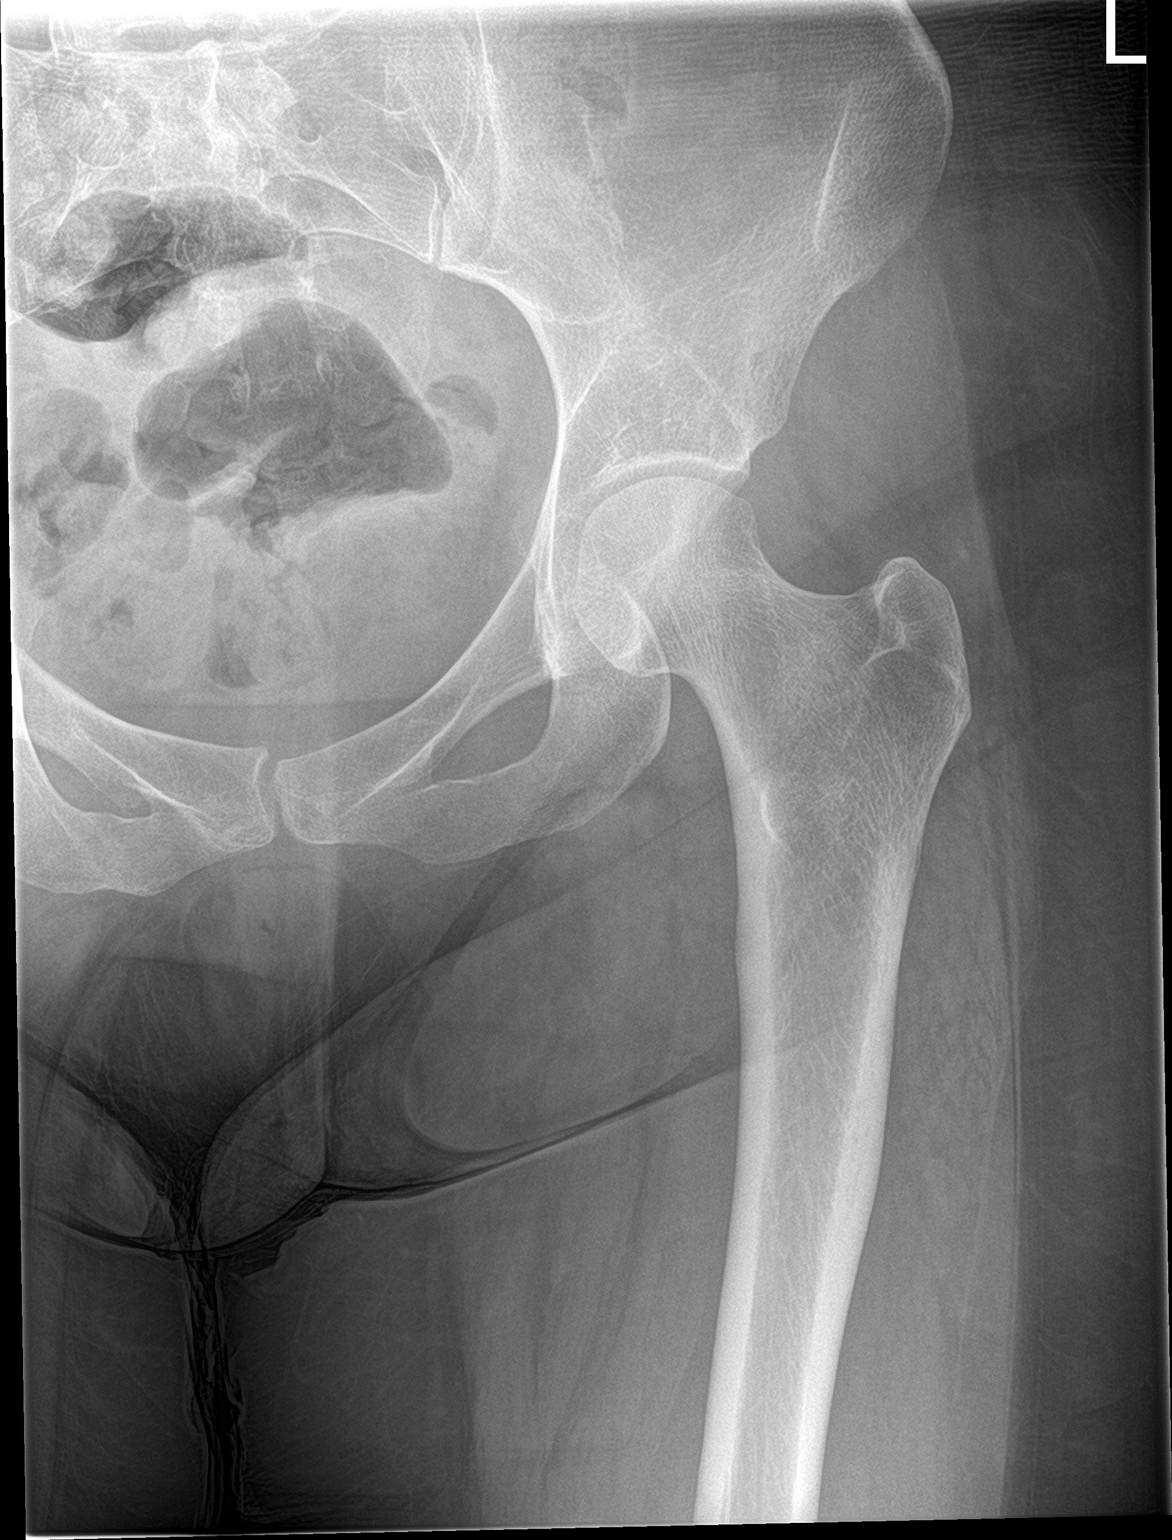

[hip lat]
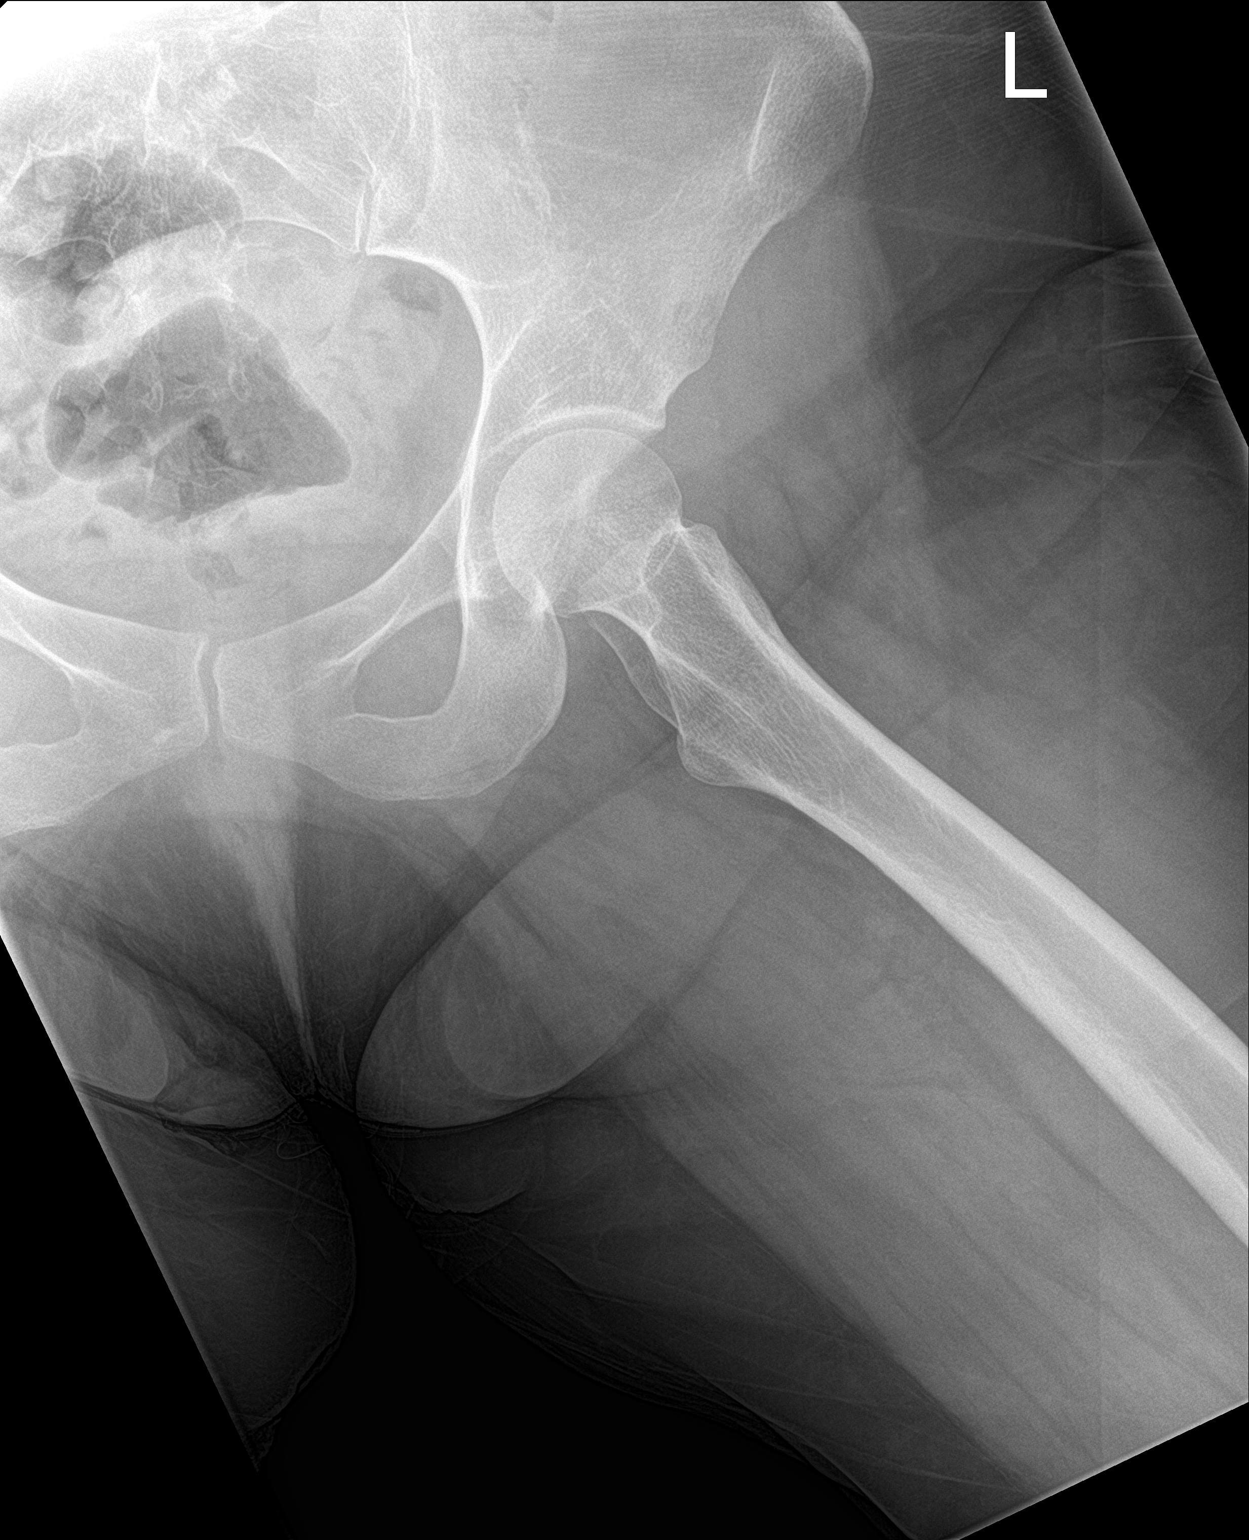

[3 of 3 positions shown; findings below may reference images not displayed]

FINDINGS: The cortical margins of the bony pelvis and left hip are intact. No
fracture. Pubic symphysis and sacroiliac joints are congruent. Both
femoral heads are well-seated in the respective acetabula.
IMPRESSION: Negative radiographs of the pelvis and left hip.

## 2015-12-10 IMAGING — DX DG ANKLE COMPLETE 3+V*L*
3 series · 3 of 3 positions shown · non-contrast
Comparison: Most recent ankle radiographs 10/18/2015

CLINICAL DATA: Multiple falls with left ankle pain.

EXAM:
LEFT ANKLE COMPLETE - 3+ VIEW

[ankle ap]
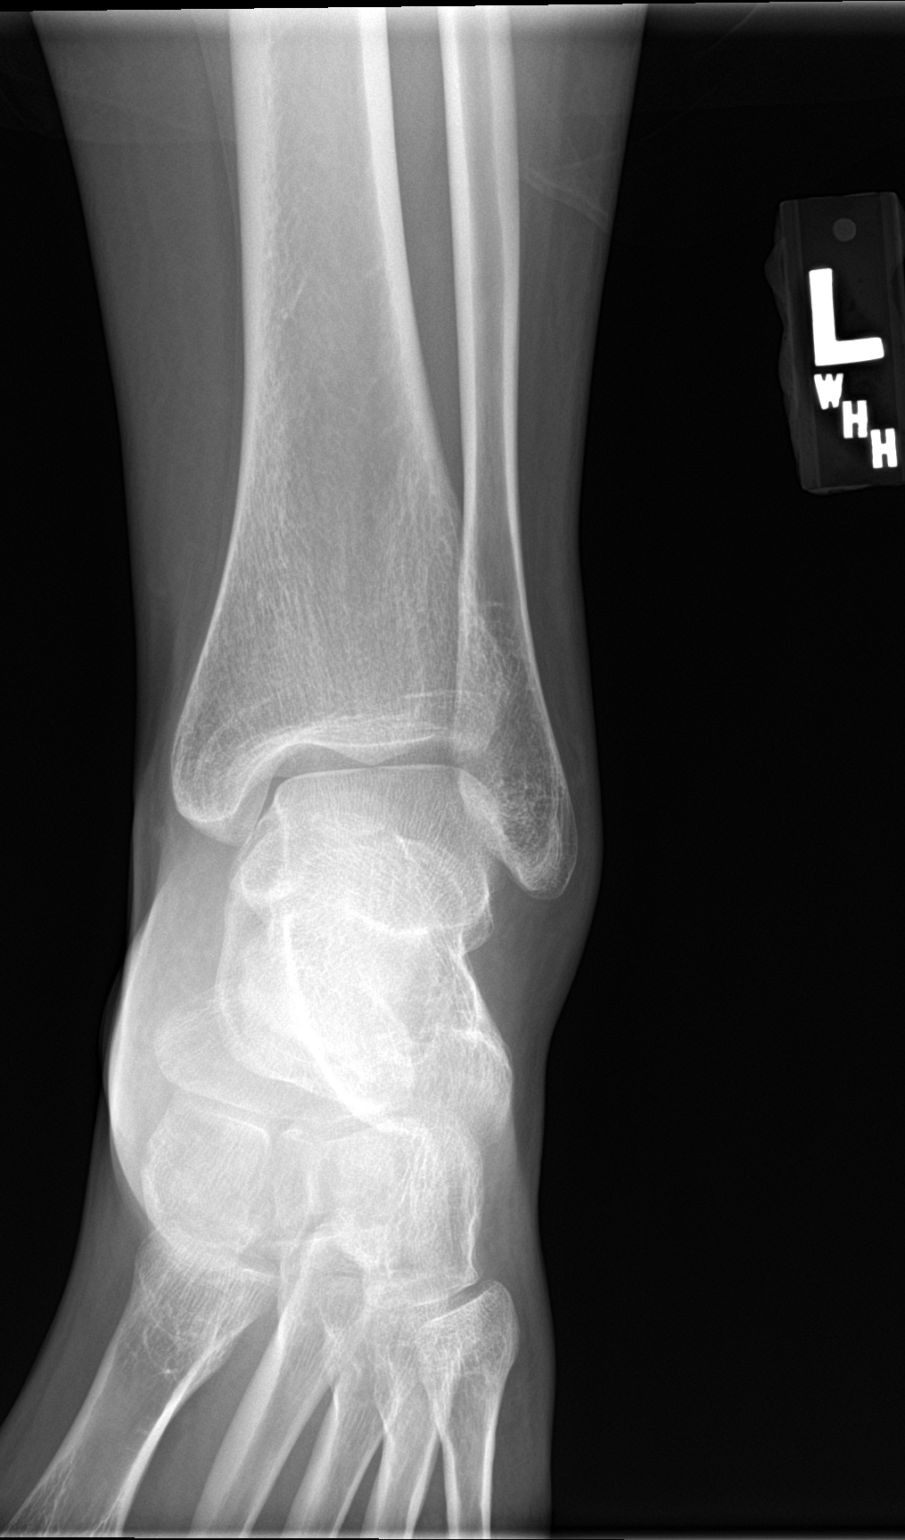

[ankle obl]
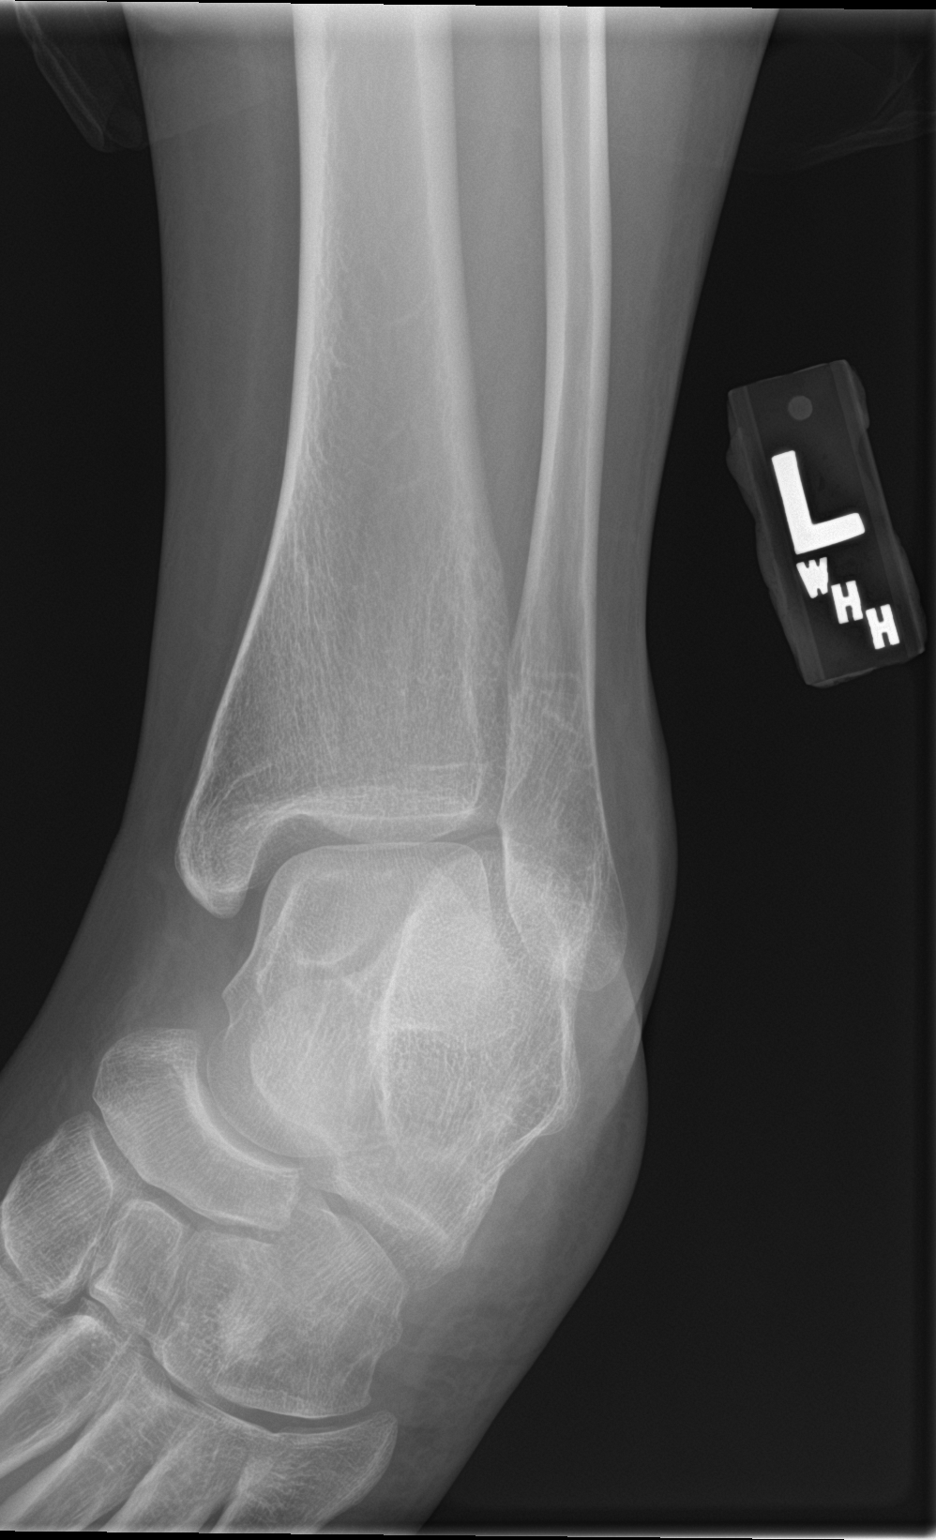

[ankle lat]
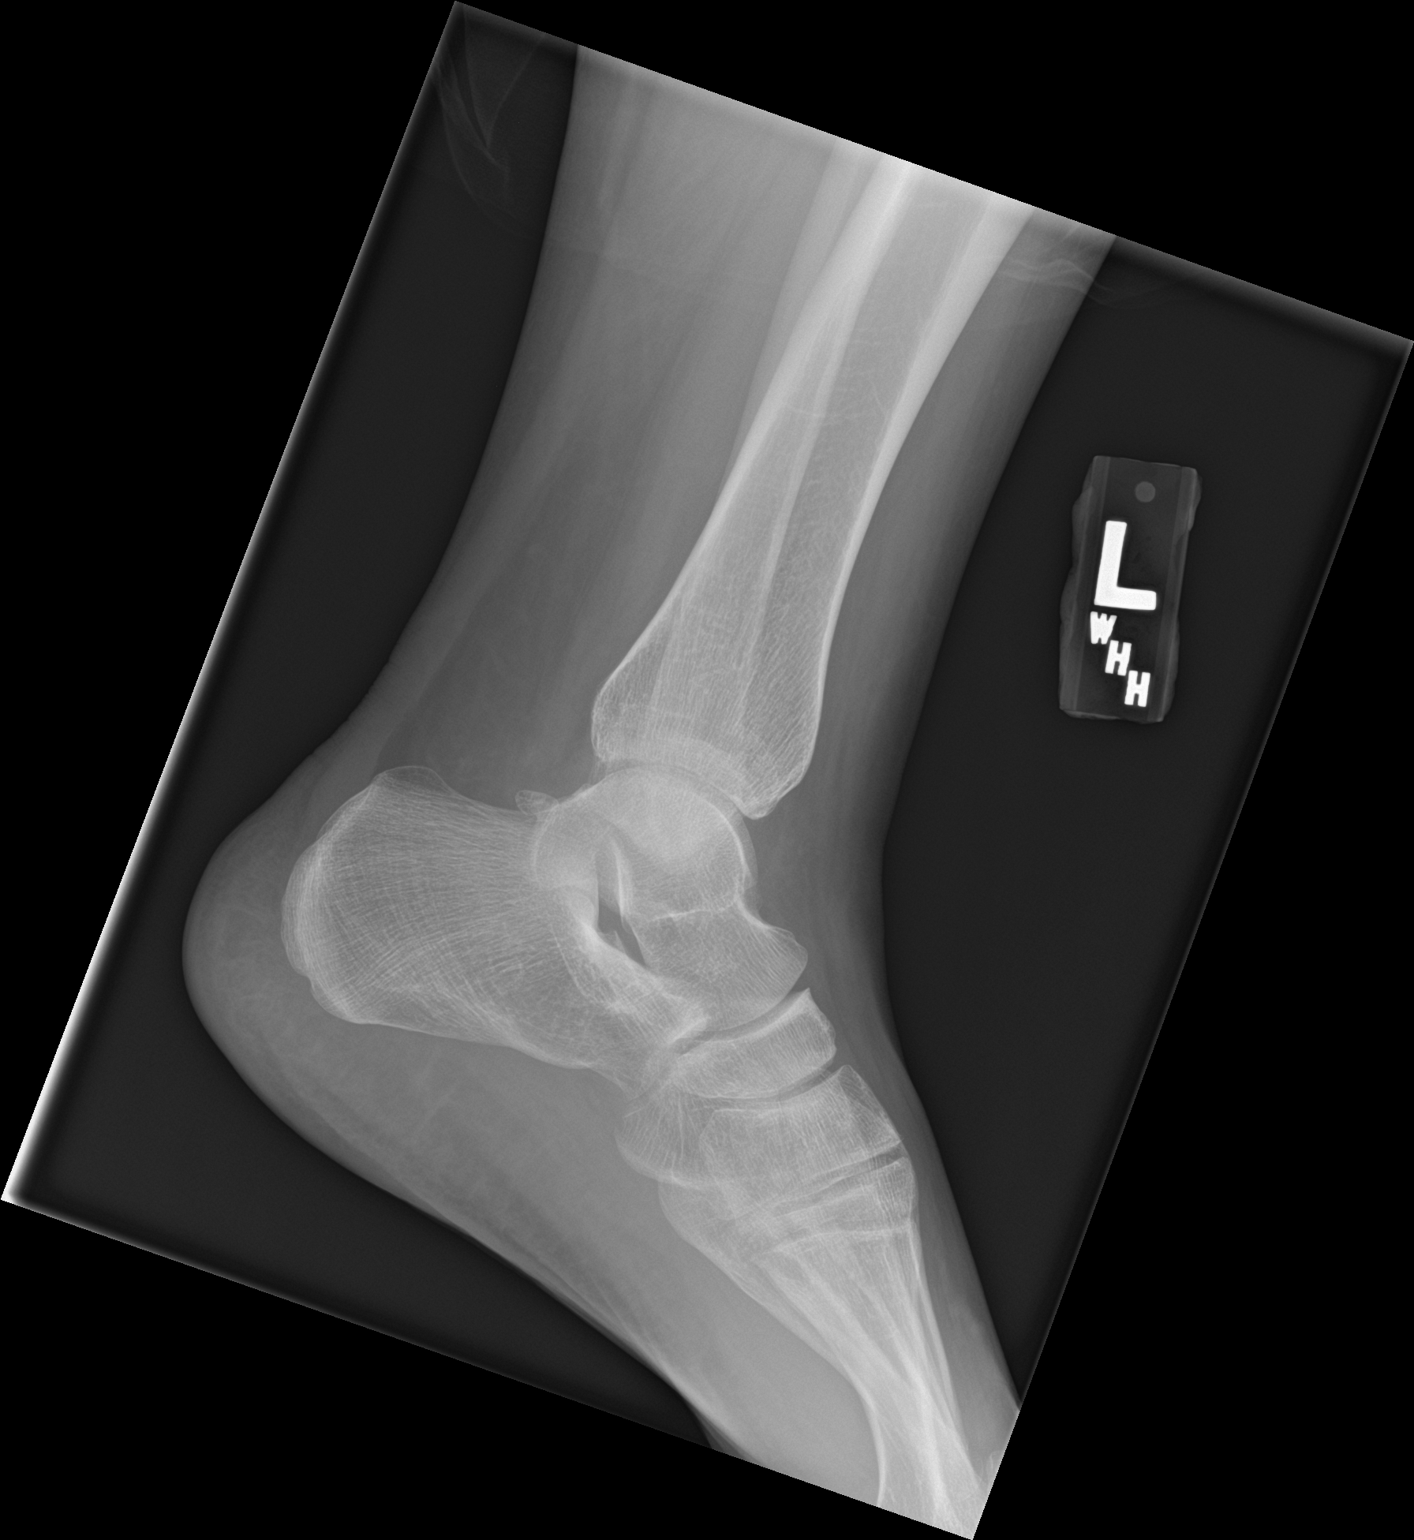

[3 of 3 positions shown; findings below may reference images not displayed]

FINDINGS: No fracture or dislocation. The alignment and joint spaces are
maintained. No focal soft tissue abnormality.
IMPRESSION: Negative radiographs of the left ankle.

## 2015-12-11 ENCOUNTER — Ambulatory Visit (INDEPENDENT_AMBULATORY_CARE_PROVIDER_SITE_OTHER): Payer: Medicaid Other | Admitting: Neurology

## 2015-12-11 DIAGNOSIS — R29898 Other symptoms and signs involving the musculoskeletal system: Secondary | ICD-10-CM | POA: Diagnosis not present

## 2015-12-11 DIAGNOSIS — G609 Hereditary and idiopathic neuropathy, unspecified: Secondary | ICD-10-CM

## 2015-12-11 NOTE — Procedures (Signed)
[facility]  [address]  [place], [place] [address] Tel: [phone] Fax:  [phone] Test Date:  12/11/2015  [name] DOB: 1986-03-09 Physician: Narda Amber  Sex: Female Height: 5\' 7"  Ref Phys: Andre Lefort, M.D.  ID#: [id number] Temp: 32.0C Technician: Jerilynn Mages. Dean   Patient Complaints: This is a 30 year old female referred for evaluation of bilateral leg weakness and painful paresthesias, worse on the left.  NCV & EMG Findings: Extensive electrodiagnostic testing of the left lower extremity and additional studies of the right shows: 1. Bilateral sural and superficial peroneal sensory responses are absent. 2. Bilateral peroneal motor responses recording at the extensor digitorum brevis shows prolonged latency, markedly reduced amplitude, and borderline conduction velocity slowing across its course.  Bilateral tibial motor responses are reduced in amplitude and again show evidence of mild conduction velocity slowing. Bilateral peroneal motor responses recording at the tibialis anterior is within normal limits. 3. Bilateral tibial H reflex studies are absent. 4. Needle electrode examination was prematurely terminated as patient was unable to tolerate the procedure due to pain. From the limited muscles which were studied, there is evidence of active motor axon loss changes affecting the left tibialis anterior muscle. Additionally, there is no motor unit recruitment seen in the left anterior tibialis, medial gastrocnemius, flexor digitorum longus, and rectus femoris muscles.  It is unclear whether reduced recruitment pattern is due to poor activation or underlying disease process.  Impression: This is a limited study as needed electrode examination was prematurely terminated due to pain.  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  Unfortunately, needle electrode  examination of proximal muscles was not performed however the appropriate clinical setting, evaluation for polyradiculoneuropathy should be considered.  _____________________________ Narda Amber, D.O.    Nerve Conduction Studies Anti Sensory Summary Table   Stim Site NR Peak (ms) Norm Peak (ms) P-T Amp (V) Norm P-T Amp  Left Sup Peroneal Anti Sensory (Ant Lat Mall)  12 cm NR  <4.4  >6  Right Sup Peroneal Anti Sensory (Ant Lat Mall)  12 cm NR  <4.4  >6  Left Sural Anti Sensory (Lat Mall)  Calf NR  <4.4  >6  Right Sural Anti Sensory (Lat Mall)  Calf NR  <4.4  >6   Motor Summary Table   Stim Site NR Onset (ms) Norm Onset (ms) O-P Amp (mV) Norm O-P Amp Site1 Site2 Delta-0 (ms) Dist (cm) Vel (m/s) Norm Vel (m/s)  Left Peroneal Motor (Ext Dig Brev)  Ankle    8.4 <5.5 0.5 >3 B Fib Ankle 10.3 34.0 33 >41  B Fib    18.7  0.8  Poplt B Fib 2.5 10.0 40 >41  Poplt    21.2  0.7         Right Peroneal Motor (Ext Dig Brev)  Ankle    6.3 <5.5 1.7 >3 B Fib Ankle 8.8 34.0 39 >41  B Fib    15.1  1.5  Poplt B Fib 2.7 10.0 37 >41  Poplt    17.8  1.3         Left Peroneal TA Motor (Tib Ant)  Fib Head    3.5 <4.0 4.3 >4 Poplit Fib Head 1.8 10.0 56 >41  Poplit    5.3  4.3         Right Peroneal TA Motor (Tib Ant)  Fib Head    3.0 <4.0 4.3 >4 Poplit Fib Head  1.7 10.0 59 >41  Poplit    4.7  4.3         Left Tibial Motor (Abd Hall Brev)  Ankle    4.7 <5.8 4.4 >8 Knee Ankle 11.6 39.0 34 >41  Knee    16.3  3.7         Right Tibial Motor (Abd Hall Brev)  Ankle    4.5 <5.8 2.9 >8 Knee Ankle 11.2 39.0 35 >41  Knee    15.7  1.2          H Reflex Studies   NR H-Lat (ms) Lat Norm (ms) L-R H-Lat (ms)  Left Tibial (Gastroc)  NR  <35   Right Tibial (Gastroc)  NR  <35    EMG   Side Muscle Ins Act Fibs Psw Fasc Number Recrt Dur Dur. Amp Amp. Poly Poly. Comment  Left AntTibialis Nml 1+ Nml Nml None None - - - - - - N/A  Left Gastroc Nml Nml Nml Nml None None - - - - - - N/A  Left Flex Dig Long Nml  Nml Nml Nml None None - - - - - - N/A  Left Rectus Femoris Nml Nml Nml Nml None None - - - - - - N/A     Waveforms:

## 2015-12-12 ENCOUNTER — Ambulatory Visit (INDEPENDENT_AMBULATORY_CARE_PROVIDER_SITE_OTHER): Payer: Medicaid Other | Admitting: Neurology

## 2015-12-12 ENCOUNTER — Encounter: Payer: Self-pay | Admitting: Neurology

## 2015-12-12 ENCOUNTER — Other Ambulatory Visit: Payer: Medicaid Other

## 2015-12-12 ENCOUNTER — Telehealth: Payer: Self-pay | Admitting: Family Medicine

## 2015-12-12 VITALS — BP 130/74 | HR 86 | Ht 67.0 in | Wt 202.0 lb

## 2015-12-12 DIAGNOSIS — G610 Guillain-Barre syndrome: Secondary | ICD-10-CM

## 2015-12-12 DIAGNOSIS — M792 Neuralgia and neuritis, unspecified: Secondary | ICD-10-CM

## 2015-12-12 DIAGNOSIS — G8314 Monoplegia of lower limb affecting left nondominant side: Secondary | ICD-10-CM

## 2015-12-12 NOTE — Patient Instructions (Addendum)
1.  Spinal fluid analysis with lumbar puncture 2.  Check blood work 3.  Start folic acid 1mg  daily 4.  We will call you with details of pain management and home physical therapy referral  We will call you with the results of the testing

## 2015-12-12 NOTE — Telephone Encounter (Signed)
Ok plus one ref 

## 2015-12-12 NOTE — Telephone Encounter (Signed)
Pharmacy stated that patient had a refill on this med and they have already filled it for the day and ready for pickup. Patient was notified.

## 2015-12-12 NOTE — Progress Notes (Signed)
[facility] Clinic Note - Initial Visit   Date: 12/12/2015  Penny [name] MRN: [id number] DOB: Nov 24, 1986   Dear Dr. Gerda Diss:  Thank you for your kind referral of Penny [name] for consultation of left > right leg weakness and pain. Although her history is well known to you, please allow [name] to reiterate it for the purpose of our medical record. The patient was accompanied to the clinic by mother who also provides collateral information.     History of Present Illness: Penny [name] is a 30 y.o. right-handed Caucasian female with major depression, migraines, ADHD, anixety, gastric bypass surgery (2007), fibromyalgia, current tobacco use, and nonepileptic spells presenting for evaluation of left > right leg weakness and pain.    She reports having a complicated pregnancy with her daughter and developed preeclampsia and was induced at 29.5 weeks (August 2015).  She reports having two epidurals which were not completely successful.  She reports having difficulty delivering her placenta and was eventually taken to surgery to remove the products. She was in the lithotomy position for 4 hours and was taken to the ICU.  The following morning, he had a fall due to left leg weakness.  She had back and left leg pain which followed. Imaging of her low back was normal.  She was discharged home with a walker and slowly improved over the following weeks.  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    Starting in October 2016, she had a fall and initially attributed it to ankle instability, but this slowly progressed and she began having more frequent falls.  In December 2016, she develop relatively acute onset of left leg weakness/numbness causing another fall and inability to walk which prompted her to go to the emergency department.  Over the preceding few days, she also began experiencing  similar symptoms over the right leg and numbness of the left side of her face.  She also complains of urinary incontinence, described more as urge incontinence as she feels the need to go but cannot control it.   She was admitted to [facility] on 01/21 - 01/22/ where she presented with progressively worsening  left leg weakness, numbness, and falls.  She had MRI of the brain, cervical, and lumbar spine which essentially was nondiagnostic.  Labs indicated folate deficiency and low ferritin levels.  She had mild elevation in AST.  Current symptoms include: - Low back pain, shooting pain. Laying supine improves the pain, activity exacerbates her pain - Left leg is cold from the knee down, as if there is a "ball at the bottom of the foot" numbness, numbness involves her entire left lower leg, lateral thigh and hip - Left leg weakness from the knee down, with minimal movement of the foot and toes - Falls about daily, even with the walker.  She cannot stand to rise independently. - Tingling and numbness of the face and arms. 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  She also has mild transaminitis and followed at [facility].  She also has major depression, anxiety, ADHD, and insomnia and takes tegretol for mood stabilization, Cymbalta for depression, trazodone for sleep.  For anxiety and ADHD, she takes Adderall XR 30 mg daily  for ADHD.  Out-side paper records, electronic medical record, and images have been reviewed where available and summarized as:  Labs 11/27/2015:  ESR 19, CRP <0.5, ferritin 5*, folate 4.9**, vitamin B12 410, TSH 0.977 Labs 11/15/2015:  AST 45*, ALT 47, Cr 0.69  MRI brain wo contrast 11/27/2015: 1. No acute intracranial abnormality. 2. 6 mm pineal cyst, otherwise unremarkable appearance  of the brain.   MRI cervical spine wwo contrast 11/27/2015: 1. No acute abnormality within the cervical spine. No significant stenosis or cord signal abnormality. 2. Posterior disc protrusion at C4-5 without significant stenosis or evidence of neural impingement. 3. Tiny central disc protrusion at C6-7 without stenosis.   MRI lumbar spine wo contrast 11/27/2015: 1. Stable appearance of the lumbar spine. No evidence of severe stenosis or cord compression. No new disc herniation. 2. Small RIGHT paracentral disc protrusion at L3-4 without stenosis or neural impingement. 3. Small RIGHT paracentral disc protrusion at L4-5 without stenosis or evidence of neural impingement.  NCS/EMG of the left leg 12/12/2015: This is a limited study as needed electrode examination was prematurely terminated due to pain.  Nerve conduction studies  show active severe sensorimotor, axonal loss and demyelinating in type, affecting the lower extremities.  Unfortunately, needle  electrode examination of proximal muscles was not performed however the appropriate clinical setting, evaluation for polyradiculoneuropathy should be considered.   Past Medical History  Diagnosis Date  . Migraines   . Interstitial cystitis   . IUD FEB 2010  . Gastritis JULY 2011  . Depression   . 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 Fatty liver   . Obesity (BMI 30-39.9) 2011 228 LBS BMI 36.8  . Iron deficiency anemia 07/23/2010  . Irritable bowel syndrome 2012 DIARRHEA    JUN 2012 TTG IgA 14.9  . Elevated liver enzymes JUL 2011ALK PHOS 111-127 AST  143-267 ALT  213-321T BILI 0.6  ALB  3.7-4.06 Jun 2011 ALK PHOS 118 AST 24 ALT 42 T BILI 0.4 ALB 3.9  . Chronic daily headache   . Ovarian cyst   . ADD (attention deficit disorder)   . RLQ abdominal pain 07/18/2013  . Menorrhagia 07/18/2013  . Anxiety   . Potassium (K) deficiency   . Dysmenorrhea 09/18/2013  . Stress 09/18/2013  . Patient desires pregnancy 09/18/2013  . Pregnant  12/25/2013  . Blood transfusion without reported diagnosis   . Heart murmur   . Gastric bypass status for obesity   . 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 Dysrhythmia   . Sciatica of left side 11/14/2014  . Fibromyalgia   . 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 PONV (postoperative nausea and vomiting) seizure post-operatively  . Psychiatric pseudoseizure   . Depression     Past Surgical History  Procedure Laterality Date  . Gab  2007    in [place]-POUCH 5 CM  . Cholecystectomy  2005    biliary dyskinesia  . Cath self every nite      for sodium bicarb injection (discontinued 2013)  . Colonoscopy  JUN 2012 ABD PN/DIARRHEA WITH PROPOFOL    NL COLON  . Upper gastrointestinal endoscopy  JULY 2011 NAUSEA-D125,V6, PH 25    Bx; GASTRITIS, POUCH-5 CM LONG  . Dilation and curettage of uterus    . Tonsillectomy    . Gastric bypass  06/2006  . Savory dilation  06/20/2012    Dr. Cyndi Bender gastritis/Ulcer in the mid jejunum. Empiric dilation.   Marland Kitchen  Tonsillectomy and adenoidectomy    . Esophagogastroduodenoscopy    . Wisdom tooth extraction    . Repair vaginal cuff N/A 07/30/2014    Procedure: REPAIR VAGINAL CUFF;  Surgeon: Catalina Antigua, MD;  Location: [facility];  Service: Gynecology;  Laterality: N/A;  . Hysteroscopy w/d&c N/A 09/12/2014    Procedure: DILATATION AND CURETTAGE /HYSTEROSCOPY;  Surgeon: Tilda Burrow, MD;  Location: [facility];  Service: Gynecology;  Laterality: N/A;  . Esophagogastroduodenoscopy (egd) with propofol N/A 04/10/2015    Procedure: ESOPHAGOGASTRODUODENOSCOPY (EGD) WITH PROPOFOL;  Surgeon: Corbin Ade, MD;  Location: [facility];  Service: Endoscopy;  Laterality: N/A;  . Esophageal dilation N/A 04/10/2015    Procedure: ESOPHAGEAL DILATION WITH 54FR MALONEY DILATOR;  Surgeon: Corbin Ade, MD;  Location: [facility];  Service: Endoscopy;  Laterality: N/A;  . Esophageal biopsy  04/10/2015    Procedure: BIOPSY;  Surgeon: Corbin Ade, MD;   Location: [facility];  Service: Endoscopy;;     Medications:  Outpatient Encounter Prescriptions as of 12/12/2015  Medication Sig  . amphetamine-dextroamphetamine (ADDERALL XR) 30 MG 24 hr capsule Take 1 capsule (30 mg total) by mouth daily.  Marland Kitchen APAP-Pamabrom-Pyrilamine (MENSTRUAL PAIN RELIEF) 500-25-15 MG TABS Take 1-2 tablets by mouth daily as needed (for pain).  . carbamazepine (TEGRETOL) 200 MG tablet Take two tablets at bedtime (Patient taking differently: Take 400 mg by mouth at bedtime. Take two tablets at bedtime)  . clonazePAM (KLONOPIN) 0.5 MG tablet Take 1 tablet (0.5 mg total) by mouth 3 (three) times daily as needed for anxiety.  . DULoxetine (CYMBALTA) 60 MG capsule Take 1 capsule (60 mg total) by mouth daily.  Marland Kitchen ibuprofen (ADVIL,MOTRIN) 800 MG tablet Take 1 tablet (800 mg total) by mouth every 8 (eight) hours as needed for mild pain. (Patient not taking: Reported on 11/26/2015)  . lidocaine (LIDODERM) 5 % Place 1 patch onto the skin daily. Remove & Discard patch within 12 hours or as directed by MD  . topiramate (TOPAMAX) 50 MG tablet Take 50 mg by mouth See admin instructions. Take 50mg  in the am 100mg  qhs  . traZODone (DESYREL) 150 MG tablet Take 1 tablet (150 mg total) by mouth at bedtime as needed for sleep.  . [DISCONTINUED] fluconazole (DIFLUCAN) 150 MG tablet Take 1 tablet (150 mg total) by mouth once. (Patient not taking: Reported on 11/26/2015)  . [DISCONTINUED] HYDROcodone-acetaminophen (NORCO/VICODIN) 5-325 MG tablet Take 1 tablet by mouth every 8 (eight) hours as needed. Caution drowiness (Patient not taking: Reported on 12/12/2015)  . [DISCONTINUED] tiZANidine (ZANAFLEX) 4 MG tablet TAKE ONE TABLET BY MOUTH THREE TIMES DAILY AS NEEDED FOR MUSCLE SPASMS. (Patient not taking: Reported on 12/12/2015)   No facility-administered encounter medications on file as of 12/12/2015.     Allergies:  Allergies  Allergen Reactions  . Gabapentin Other (See Comments)    Pt states that she  was unresponsive after taking this medication, but her vitals remained stable.    . Metoclopramide Hcl Anxiety and Other (See Comments)    Pt states that she felt like she was trapped in a box, and could not get out.  Pt also states that she had temporary loss of movement, weakness, and tingling.    . Codeine Nausea And Vomiting    Pt reports that she takes this med with no problems now EMO RN  . Zofran [Ondansetron] Other (See Comments)    Migraines   . Morphine And Related     Chest pain   .  Tramadol     Seizures  . Latex Rash  . Tape Itching and Rash    Please use paper tape    Family History: Family History  Problem Relation Age of Onset  . Hemochromatosis Maternal Grandmother   . Migraines Maternal Grandmother   . Cancer Maternal Grandmother   . Breast cancer Maternal Grandmother   . Hypertension Father   . Diabetes Father   . Coronary artery disease Father   . Migraines Paternal Grandmother   . Breast cancer Paternal Grandmother   . Cancer Mother     breast  . Hemochromatosis Mother   . Breast cancer Mother   . Depression Mother   . Anxiety disorder Mother   . Coronary artery disease Paternal Grandfather   . Anxiety disorder Brother   . Bipolar disorder Brother     Social History: Social History  Substance Use Topics  . Smoking status: Current Every Day Smoker -- 0.25 packs/day for 6 years    Types: Cigarettes  . Smokeless tobacco: Never Used  . Alcohol Use: 0.0 oz/week    0 Standard drinks or equivalent per week     Comment: once every weeks - maybe a beer with a dinner   Social History   Social History Narrative   Patient is right handed.   Patient drinks one cup of coffee daily.    Review of Systems:  CONSTITUTIONAL: No fevers, chills, night sweats, or weight loss.   EYES: No visual changes or eye pain ENT: No hearing changes.  No history of nose bleeds.   RESPIRATORY: No cough, wheezing and shortness of breath.   CARDIOVASCULAR: Negative for  chest pain, and palpitations.   GI: Negative for abdominal discomfort, blood in stools or black stools.  No recent change in bowel habits.   GU:  No history of incontinence.   MUSCLOSKELETAL: No history of joint pain or swelling.  +myalgias.   SKIN: Negative for lesions, rash, and itching.   HEMATOLOGY/ONCOLOGY: Negative for prolonged bleeding, bruising easily, and swollen nodes.  No history of cancer.   ENDOCRINE: Negative for cold or heat intolerance, polydipsia or goiter.   PSYCH:  +depression or anxiety symptoms.   NEURO: As Above.   Vital Signs:  BP 130/74 mmHg  Pulse 86  Ht 5\' 7"  (1.702 m)  Wt 202 lb (91.627 kg)  BMI 31.63 kg/m2  SpO2 98%  LMP 11/13/2015    General Medical Exam:   General:  Well appearing, comfortable.   Eyes/ENT: see cranial nerve examination.   Neck: No masses appreciated.  Full range of motion without tenderness.  No carotid bruits. Respiratory:  Clear to auscultation, good air entry bilaterally.   Cardiac:  Regular rate and rhythm, no murmur.   Extremities:  No deformities, edema, or skin discoloration.  Skin:  No rashes or lesions.  Neurological Exam: MENTAL STATUS including orientation to time, place, person, recent and remote memory, attention span and concentration, language, and fund of knowledge is normal.  Speech is not dysarthric.  CRANIAL NERVES: II:  No visual field defects.  Unremarkable fundi.   III-IV-VI: Pupils equal round and reactive to light.  Normal conjugate, extra-ocular eye movements in all directions of gaze.  No nystagmus.  Mild left ptosis without worsening with sustained upgaze.   V:  Normal facial sensation.     VII:  Normal facial symmetry and movements.  No pathologic facial reflexes.  VIII:  Normal hearing and vestibular function.   IX-X:  Normal palatal movement.  XI:  Normal shoulder shrug and head rotation.   XII:  Normal tongue strength and range of motion, no deviation or fasciculation.  MOTOR:  No atrophy,  fasciculations or abnormal movements.  No pronator drift.    Right Upper Extremity:    Left Upper Extremity:    Deltoid  5/5   Deltoid  5/5   Biceps  5/5   Biceps  5/5   Triceps  5/5   Triceps  5/5   Wrist extensors  5/5   Wrist extensors  5/5   Wrist flexors  5/5   Wrist flexors  5/5   Finger extensors  5/5   Finger extensors  5/5   Finger flexors  5/5   Finger flexors  5/5   Dorsal interossei  5/5   Dorsal interossei  5/5   Abductor pollicis  5/5   Abductor pollicis  5/5   Tone (Ashworth scale)  0  Tone (Ashworth scale)  0   Right Lower Extremity:    Left Lower Extremity:    Hip flexors  5/5   Hip flexors  2+/5   Hip extensors  5/5   Hip extensors  4/5   Knee flexors  5/5   Knee flexors  4-/5   Knee extensors  5/5   Knee extensors  4-/5   Dorsiflexors  5/5   Dorsiflexors  2/5   Plantarflexors  5/5   Plantarflexors  2/5   Toe extensors  5/5   Toe extensors  2/5   Toe flexors  5/5   Toe flexors  2/5   Tone (Ashworth scale)  0  Tone (Ashworth scale)  0   MSRs:  Right                                                                 Left brachioradialis 2+  brachioradialis 2+  biceps 1+  biceps 1+  triceps 1+  triceps 1+  patellar 1+  patellar 2+  ankle jerk 0  ankle jerk 0  Hoffman no  Hoffman no  plantar response down  plantar response down   SENSORY: Vibration reduced to 75% at the left MCP, distal to right knee is 75%, 20% left knee and absent distal to ankles on the left. Sensation to temperature, light touch, and pin prick is reduced in a circumferential pattern involving the left lower leg.    COORDINATION/GAIT: Normal finger-to- nose-finger.  Intact rapid alternating movements bilaterally.  Unable to rise from a chair without using arms.  Gait appears antalgic and nonphysiological at times with dragging of both feet and heavily depending on the walker.   IMPRESSION: Ms. Pletcher is a 30 year-old female referred for evaluation of progressively worsening left > right leg  weakness, painful paresthesias, and falls since late October 2016.  Although exam shows focal findings of left leg weakness, there are nonphysiological features which makes accurate localization of symptoms extremely difficult.  She had underwent extensive work-up prior to seeing me including MRI brain wo contrast, contrast cervical spine, non-contrast lumbar spine, and NCS/EMG.  Imaging did not disclose anything remarkable to explain her symptoms. I performed her NCS/EMG yesterday which was challenging and prematurely terminated as she was unable to tolerate the testing.  Her NCS portion does disclose a rather severe  sensorimotor polyneuropathy affecting the legs and with the limited needle study there is active changes involving the TA muscle.  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  Ideally, further EDX of the upper extremities would help determine the degree of involvement, but I do not feel that she would be able to tolerate this.  I would like to proceed with obtaining CSF testing.  For completeness, I will also screen for autoimmune/inflammatory conditions for possible vasculitis.   PLAN/RECOMMENDATIONS:  1. Check ANA, SSA/B, ANCA, cryogobulins, copper, ceruloplasmin, zinc, RPR, heavy metals, selenium, SPEP/UPEP with IFE 2. CSF testing:  CSF cell count and diff, protein, glucose, routine cultures, fungal cultures, IgG index, oligoclonal bands, myelin basic protein, flow cytology, ACE, CSF lyme PCR 3. Start folate 1mg  daily 4. Limited options for pain as she has not tolerated gabapentin, Lyrica, or TCAs in the past.  She is already on carbamazepine, lidocaine, topiramate, and cymbalta 60mg  daily.  She is aware that we are a non-opioid prescribing practice and for additional options she should see pain management.  5.  Home PT and Pain management referral   Return to clinic after above testing   The  duration of this appointment visit was 60 minutes of face-to-face time with the patient.  Greater than 50% of this time was spent in counseling, explanation of diagnosis, planning of further management, and coordination of care.   Thank you for allowing me to participate in patient's care.  If I can answer any additional questions, I would be pleased to do so.    Sincerely,    Amiayah Giebel K. Allena Katz, DO

## 2015-12-12 NOTE — Telephone Encounter (Signed)
Pt is needing refills on her tiZANidine (ZANAFLEX) 4 MG tablet     [employer]

## 2015-12-13 LAB — RPR

## 2015-12-15 ENCOUNTER — Other Ambulatory Visit: Payer: Self-pay

## 2015-12-15 ENCOUNTER — Ambulatory Visit (HOSPITAL_COMMUNITY): Payer: Self-pay | Admitting: Psychiatry

## 2015-12-15 DIAGNOSIS — G610 Guillain-Barre syndrome: Secondary | ICD-10-CM

## 2015-12-15 DIAGNOSIS — G8314 Monoplegia of lower limb affecting left nondominant side: Secondary | ICD-10-CM

## 2015-12-15 LAB — ANTI-NUCLEAR AB-TITER (ANA TITER): ANA Titer 1: 1:80 {titer} — ABNORMAL HIGH

## 2015-12-15 LAB — ANA: Anti Nuclear Antibody(ANA): POSITIVE — AB

## 2015-12-15 LAB — PAN-ANCA: ANCA Screen: NEGATIVE

## 2015-12-15 LAB — SJOGREN'S SYNDROME ANTIBODS(SSA + SSB)
SSA (RO) (ENA) ANTIBODY, IGG: NEGATIVE
SSB (La) (ENA) Antibody, IgG: <1

## 2015-12-16 ENCOUNTER — Encounter: Payer: Self-pay | Admitting: Neurology

## 2015-12-16 LAB — SPEP & IFE WITH QIG
Albumin ELP: 4.3 g/dL (ref 3.8–4.8)
Alpha-1-Globulin: 0.3 g/dL (ref 0.2–0.3)
Alpha-2-Globulin: 0.8 g/dL (ref 0.5–0.9)
BETA 2: 0.3 g/dL (ref 0.2–0.5)
BETA GLOBULIN: 0.5 g/dL (ref 0.4–0.6)
Gamma Globulin: 1 g/dL (ref 0.8–1.7)
IGA: 143 mg/dL (ref 69–380)
IGM, SERUM: 194 mg/dL (ref 52–322)
IgG (Immunoglobin G), Serum: 841 mg/dL (ref 690–1700)
Total Protein, Serum Electrophoresis: 7.2 g/dL (ref 6.1–8.1)

## 2015-12-16 LAB — SELENIUM SERUM: Selenium, Blood: 127 mcg/L (ref 63–160)

## 2015-12-16 LAB — ZINC: ZINC: 69 ug/dL (ref 60–130)

## 2015-12-16 LAB — COPPER, SERUM: COPPER: 118 ug/dL (ref 70–175)

## 2015-12-17 ENCOUNTER — Telehealth (HOSPITAL_COMMUNITY): Payer: Self-pay | Admitting: *Deleted

## 2015-12-18 ENCOUNTER — Telehealth (HOSPITAL_COMMUNITY): Payer: Self-pay | Admitting: *Deleted

## 2015-12-19 ENCOUNTER — Telehealth: Payer: Self-pay | Admitting: Nurse Practitioner

## 2015-12-19 ENCOUNTER — Other Ambulatory Visit: Payer: Self-pay | Admitting: Nurse Practitioner

## 2015-12-19 ENCOUNTER — Telehealth: Payer: Self-pay | Admitting: Neurology

## 2015-12-19 ENCOUNTER — Ambulatory Visit
Admission: RE | Admit: 2015-12-19 | Discharge: 2015-12-19 | Disposition: A | Discharge status: Home / Self Care | Payer: Medicaid Other | Source: Ambulatory Visit | Attending: Neurology | Admitting: Neurology

## 2015-12-19 ENCOUNTER — Other Ambulatory Visit: Payer: Self-pay | Admitting: Neurology

## 2015-12-19 DIAGNOSIS — G8314 Monoplegia of lower limb affecting left nondominant side: Secondary | ICD-10-CM

## 2015-12-19 DIAGNOSIS — G610 Guillain-Barre syndrome: Secondary | ICD-10-CM

## 2015-12-19 DIAGNOSIS — M792 Neuralgia and neuritis, unspecified: Secondary | ICD-10-CM

## 2015-12-19 LAB — CSF CELL COUNT WITH DIFFERENTIAL
RBC COUNT CSF: 0 uL
TUBE #: 3
WBC CSF: 1 uL (ref 0–5)

## 2015-12-19 LAB — GLUCOSE, CSF: Glucose, CSF: 63 mg/dL (ref 43–76)

## 2015-12-19 LAB — PROTEIN, CSF: TOTAL PROTEIN, CSF: 28 mg/dL (ref 15–45)

## 2015-12-19 LAB — CRYOGLOBULIN

## 2015-12-19 MED ORDER — HYDROCODONE-ACETAMINOPHEN 5-325 MG PO TABS: 1.0000 | ORAL_TABLET | ORAL | Status: DC | PRN

## 2015-12-19 MED ORDER — DIAZEPAM 5 MG PO TABS
10.0000 mg | ORAL_TABLET | Freq: Once | ORAL | Status: AC
  Administered 2015-12-19: 10 mg via ORAL

## 2015-12-19 NOTE — Telephone Encounter (Signed)
See 12/02/15 telephone call from dr Nicki Reaper

## 2015-12-19 NOTE — Discharge Instructions (Signed)
° °    Lumbar Puncture Discharge Instructions ° °1. Go home and rest quietly for the next 24 hours.  It is important to lie flat for the next 24 hours.  Get up only to go to the restroom.  You may lie in the bed or on a couch on your back, your stomach, your left side or your right side.  You may have one pillow under your head.  You may have pillows between your knees while you are on your side or under your knees while you are on your back. ° °2. DO NOT drive today.  Recline the seat as far back as it will go, while still wearing your seat belt, on the way home. ° °3. You may get up to go to the bathroom as needed.  You may sit up for 10 minutes to eat.  You may resume your normal diet and medications unless otherwise indicated.  Drink lots of extra fluids today and tomorrow. ° °4. The incidence of headache, nausea, or vomiting is about 5% (one in 20 patients).  If you develop a headache, lie flat and drink plenty of fluids until the headache goes away.  Caffeinated beverages may be helpful.  If you develop severe nausea and vomiting or a headache that does not go away with flat bed rest, call the physician who sent you here.  ° °5. You may resume normal activities after your 24 hours of bed rest is over; however, do not exert yourself strongly or do any heavy lifting tomorrow. ° °6. Call your physician for a follow-up appointment.  ° °7. If you have any questions  after you arrive home, please call [phone]. ° °Discharge instructions have been explained to the patient.  The patient, or the person responsible for the patient, fully understands these instructions. °

## 2015-12-19 NOTE — Telephone Encounter (Signed)
Patient just spoke with her neurologist and the waiting list to pain management is so long that the neurologist said to call her PCP and see if we can give her pain medication until she can get into pain management for back and legs.  She has a spinal tap done this morning.  She is requesting Hoyle Sauer to give her enough to get her through the weekend until she can talk to Dr. Nicki Reaper.

## 2015-12-19 NOTE — Telephone Encounter (Signed)
Patient is hoping she can get something before we close today.

## 2015-12-19 NOTE — Progress Notes (Signed)
Nurse was called into procedure room immediately after LP completed at 1008 to find patient having a seizure; Dr. Jeralyn Ruths holding patient her right side to keep her from falling off.  Patient slightly on left side with RT supporting patient there.  Right arm extended, eyes rolled back in head with head turned toward right side slightly.  This last approximately two minutes then abated for 10-20 seconds when seizure returned.  By 1014 seizure stopped, mom was at patient's side talking to her and telling her "it was all over."  Patient remained very sedated and was taken by stretcher to recovery room.  Patient without any more seizure activity noted so far; mom at bedside.  Patient still very sleepy but able to take sips of gingerale without difficulty.  jkl

## 2015-12-19 NOTE — Telephone Encounter (Signed)
Patient called to check on this

## 2015-12-19 NOTE — Telephone Encounter (Signed)
Branford states that patient last had hydrocodone 5/325 mg #20 7 day supply filled on 12/03/15.

## 2015-12-19 NOTE — Telephone Encounter (Signed)
Called and spoke with patient - made aware per previous note that she is on or has not tolerated any medication that Dr Posey Pronto would be able to prescribe to treat her pain. Referral to pain management was sent, but she has not heard from them yet. I advised if she feels she needs opoid pain medication she should check with her primary medical doctor until she is able to be seen by pain management. She agrees with this plan.   Penny [name]- please check on pain management referral.  Dr Posey Pronto- FYI.

## 2015-12-19 NOTE — Progress Notes (Signed)
One SST tube of blood drawn from left AC space for LP labs; site unremarkable.  Janeva Peaster, RN 

## 2015-12-19 NOTE — Telephone Encounter (Signed)
Pt needs to talk to some one about her leg and back pain she is in a lot of pain please call [phone]

## 2015-12-22 ENCOUNTER — Other Ambulatory Visit: Payer: Self-pay | Admitting: *Deleted

## 2015-12-22 DIAGNOSIS — G6181 Chronic inflammatory demyelinating polyneuritis: Secondary | ICD-10-CM

## 2015-12-22 DIAGNOSIS — G610 Guillain-Barre syndrome: Secondary | ICD-10-CM

## 2015-12-22 DIAGNOSIS — M5432 Sciatica, left side: Secondary | ICD-10-CM

## 2015-12-22 DIAGNOSIS — M792 Neuralgia and neuritis, unspecified: Secondary | ICD-10-CM

## 2015-12-22 DIAGNOSIS — G8314 Monoplegia of lower limb affecting left nondominant side: Secondary | ICD-10-CM

## 2015-12-22 DIAGNOSIS — M797 Fibromyalgia: Secondary | ICD-10-CM

## 2015-12-22 LAB — CSF CULTURE: ORGANISM ID, BACTERIA: NO GROWTH

## 2015-12-22 LAB — CSF CULTURE W GRAM STAIN: Gram Stain: NONE SEEN

## 2015-12-22 LAB — OLIGOCLONAL BANDS, CSF + SERM

## 2015-12-22 NOTE — Telephone Encounter (Signed)
I did not see where a referral was sent.  I sent new one to [facility].

## 2015-12-23 ENCOUNTER — Telehealth: Payer: Self-pay | Admitting: Radiology

## 2015-12-23 ENCOUNTER — Other Ambulatory Visit: Payer: Self-pay | Admitting: *Deleted

## 2015-12-23 DIAGNOSIS — G971 Other reaction to spinal and lumbar puncture: Secondary | ICD-10-CM

## 2015-12-23 NOTE — Telephone Encounter (Signed)
Pt had an LP last Friday and then had a seizure. Pt now has a post LP headache. Explained bedrest and blood patch options to pt and asked her to contact Dr. Tomi Likens for his input. Explained since she was so severely stressed when the LP was done that he may want her to follow the bedrest option.

## 2015-12-24 ENCOUNTER — Ambulatory Visit
Admission: RE | Admit: 2015-12-24 | Discharge: 2015-12-24 | Disposition: A | Discharge status: Home / Self Care | Payer: Medicaid Other | Source: Ambulatory Visit | Attending: Neurology | Admitting: Neurology

## 2015-12-24 DIAGNOSIS — G971 Other reaction to spinal and lumbar puncture: Secondary | ICD-10-CM

## 2015-12-24 LAB — ANGIOTENSIN CONVERTING ENZYME, CSF: ACE, CSF: 11 U/L (ref <=15)

## 2015-12-24 MED ORDER — IOHEXOL 180 MG/ML  SOLN
1.0000 mL | Freq: Once | INTRAMUSCULAR | Status: AC | PRN
  Administered 2015-12-24: 1 mL via EPIDURAL

## 2015-12-24 MED ORDER — DIAZEPAM 5 MG PO TABS
10.0000 mg | ORAL_TABLET | Freq: Once | ORAL | Status: AC
  Administered 2015-12-24: 10 mg via ORAL

## 2015-12-24 NOTE — Progress Notes (Signed)
Dr. Jeralyn Ruths will come in to speak to pt.

## 2015-12-24 NOTE — Progress Notes (Signed)
Approximately 20cc blood drawn from right [facility] space for epidural blood patch; site unremarkable.  Brita Romp, RN

## 2015-12-24 NOTE — Discharge Instructions (Signed)
Blood Patch Discharge Instructions ° °1. Go home and rest quietly for the next 24 hours.  It is important to lie flat for the next 24 hours.  Get up only to go to the restroom.  You may lie in the bed or on a couch on your back, your stomach, your left side or your right side.  You may have one pillow under your head.  You may have pillows between your knees while you are on your side or under your knees while you are on your back. ° °2. DO NOT drive today.  Recline the seat as far back as it will go, while still wearing your seat belt, on the way home. ° °3. You may get up to go to the bathroom as needed.  You may sit up for 10 minutes to eat.  You may resume your normal diet and medications unless otherwise indicated.  Drink lots of extra fluids today and tomorrow..  ° °4. You may resume normal activities after your 24 hours of bed rest is over; however, do not exert yourself strongly or do any heavy lifting tomorrow. ° °5. Call your physician for a follow-up appointment.  ° °6. If you have any questions  after you arrive home, please call [phone]. ° °Discharge instructions have been explained to the patient.  The patient, or the person responsible for the patient, fully understands these instructions. ° °  ° °

## 2015-12-25 LAB — CNS IGG SYNTHESIS RATE, CSF+BLOOD
ALBUMIN CSF: 12.2 mg/dL (ref 8.0–42.0)
Albumin, Serum(Neph): 4.6 g/dL (ref 3.7–5.1)
IgG Index, CSF: 0.42 (ref <0.66)
IgG, CSF: 1 mg/dL (ref 0.8–7.7)
IgG, Serum: 903 mg/dL (ref 694–1618)
MS CNS IgG Synthesis Rate: -3.9 mg/24 h (ref <=3.3)

## 2015-12-25 LAB — LEUKEMIA/LYMPHOMA EVALUATION PANEL

## 2015-12-26 ENCOUNTER — Other Ambulatory Visit: Payer: Self-pay | Admitting: *Deleted

## 2015-12-26 ENCOUNTER — Telehealth: Payer: Self-pay | Admitting: *Deleted

## 2015-12-26 DIAGNOSIS — M797 Fibromyalgia: Secondary | ICD-10-CM

## 2015-12-26 DIAGNOSIS — M792 Neuralgia and neuritis, unspecified: Secondary | ICD-10-CM

## 2015-12-26 DIAGNOSIS — G6181 Chronic inflammatory demyelinating polyneuritis: Secondary | ICD-10-CM

## 2015-12-26 DIAGNOSIS — G610 Guillain-Barre syndrome: Secondary | ICD-10-CM

## 2015-12-26 DIAGNOSIS — M5432 Sciatica, left side: Secondary | ICD-10-CM

## 2015-12-26 DIAGNOSIS — R269 Unspecified abnormalities of gait and mobility: Secondary | ICD-10-CM

## 2015-12-26 NOTE — Telephone Encounter (Signed)
Patient is requesting lab results.

## 2015-12-26 NOTE — Telephone Encounter (Signed)
Patient instructed to come in next week for labs and to schedule a follow up appt.   I will check on HM lab.  Patient said that she had another fall last night and her leg is really numb.  I informed her that Dr. Posey Pronto is out of the office until Monday and instructed her to go to the ER.  She refuses to go to ER so I informed her that she would have to wait to be seen at her follow up appointment.

## 2015-12-26 NOTE — Telephone Encounter (Signed)
Please inform patient that so far all labs and CSF studies are looking okay, but all results are not back yet.  There is mild elevation in a screening for lupus but this is nonspecific in many cases. Let's go ahead and order dsDNA antibody, SSA/B antibodies.  Also, please follow-up on heavy metal screen as it has not resulted yet.  [name] Posey Pronto, DO

## 2015-12-28 ENCOUNTER — Encounter (HOSPITAL_COMMUNITY): Payer: Self-pay | Admitting: Emergency Medicine

## 2015-12-28 ENCOUNTER — Emergency Department (HOSPITAL_COMMUNITY): Payer: Medicaid Other

## 2015-12-28 ENCOUNTER — Emergency Department (HOSPITAL_COMMUNITY)
Admission: EM | Admit: 2015-12-28 | Discharge: 2015-12-28 | Disposition: A | Discharge status: Home / Self Care | Payer: Medicaid Other | Attending: Emergency Medicine | Admitting: Emergency Medicine

## 2015-12-28 DIAGNOSIS — H538 Other visual disturbances: Secondary | ICD-10-CM | POA: Insufficient documentation

## 2015-12-28 DIAGNOSIS — Z8719 Personal history of other diseases of the digestive system: Secondary | ICD-10-CM | POA: Diagnosis not present

## 2015-12-28 DIAGNOSIS — F419 Anxiety disorder, unspecified: Secondary | ICD-10-CM | POA: Diagnosis not present

## 2015-12-28 DIAGNOSIS — R569 Unspecified convulsions: Secondary | ICD-10-CM | POA: Diagnosis not present

## 2015-12-28 DIAGNOSIS — R110 Nausea: Secondary | ICD-10-CM | POA: Insufficient documentation

## 2015-12-28 DIAGNOSIS — R200 Anesthesia of skin: Secondary | ICD-10-CM

## 2015-12-28 DIAGNOSIS — F329 Major depressive disorder, single episode, unspecified: Secondary | ICD-10-CM | POA: Diagnosis not present

## 2015-12-28 DIAGNOSIS — Z8742 Personal history of other diseases of the female genital tract: Secondary | ICD-10-CM | POA: Diagnosis not present

## 2015-12-28 DIAGNOSIS — M545 Low back pain: Secondary | ICD-10-CM | POA: Insufficient documentation

## 2015-12-28 DIAGNOSIS — R42 Dizziness and giddiness: Secondary | ICD-10-CM | POA: Diagnosis not present

## 2015-12-28 DIAGNOSIS — R011 Cardiac murmur, unspecified: Secondary | ICD-10-CM | POA: Insufficient documentation

## 2015-12-28 DIAGNOSIS — R0602 Shortness of breath: Secondary | ICD-10-CM | POA: Insufficient documentation

## 2015-12-28 DIAGNOSIS — Z8679 Personal history of other diseases of the circulatory system: Secondary | ICD-10-CM | POA: Diagnosis not present

## 2015-12-28 DIAGNOSIS — R079 Chest pain, unspecified: Secondary | ICD-10-CM | POA: Diagnosis not present

## 2015-12-28 DIAGNOSIS — M797 Fibromyalgia: Secondary | ICD-10-CM | POA: Insufficient documentation

## 2015-12-28 DIAGNOSIS — Z87448 Personal history of other diseases of urinary system: Secondary | ICD-10-CM | POA: Insufficient documentation

## 2015-12-28 DIAGNOSIS — Z9884 Bariatric surgery status: Secondary | ICD-10-CM | POA: Diagnosis not present

## 2015-12-28 DIAGNOSIS — G43909 Migraine, unspecified, not intractable, without status migrainosus: Secondary | ICD-10-CM | POA: Diagnosis not present

## 2015-12-28 DIAGNOSIS — Z862 Personal history of diseases of the blood and blood-forming organs and certain disorders involving the immune mechanism: Secondary | ICD-10-CM | POA: Diagnosis not present

## 2015-12-28 DIAGNOSIS — F17210 Nicotine dependence, cigarettes, uncomplicated: Secondary | ICD-10-CM | POA: Diagnosis not present

## 2015-12-28 DIAGNOSIS — Z91040 Latex allergy status: Secondary | ICD-10-CM | POA: Insufficient documentation

## 2015-12-28 DIAGNOSIS — M549 Dorsalgia, unspecified: Secondary | ICD-10-CM

## 2015-12-28 LAB — CBC WITH DIFFERENTIAL/PLATELET
BASOS ABS: 0 10*3/uL (ref 0.0–0.1)
Basophils Relative: 0 %
EOS ABS: 0.1 10*3/uL (ref 0.0–0.7)
EOS PCT: 2 %
HCT: 31.1 % — ABNORMAL LOW (ref 36.0–46.0)
Hemoglobin: 9.8 g/dL — ABNORMAL LOW (ref 12.0–15.0)
LYMPHS ABS: 1.9 10*3/uL (ref 0.7–4.0)
Lymphocytes Relative: 48 %
MCH: 26.6 pg (ref 26.0–34.0)
MCHC: 31.5 g/dL (ref 30.0–36.0)
MCV: 84.5 fL (ref 78.0–100.0)
Monocytes Absolute: 0.3 10*3/uL (ref 0.1–1.0)
Monocytes Relative: 9 %
Neutro Abs: 1.6 10*3/uL — ABNORMAL LOW (ref 1.7–7.7)
Neutrophils Relative %: 41 %
PLATELETS: 273 10*3/uL (ref 150–400)
RBC: 3.68 MIL/uL — AB (ref 3.87–5.11)
RDW: 14.9 % (ref 11.5–15.5)
WBC: 3.9 10*3/uL — AB (ref 4.0–10.5)

## 2015-12-28 LAB — URINALYSIS, ROUTINE W REFLEX MICROSCOPIC
BILIRUBIN URINE: NEGATIVE
Glucose, UA: NEGATIVE mg/dL
HGB URINE DIPSTICK: NEGATIVE
Ketones, ur: NEGATIVE mg/dL
Nitrite: NEGATIVE
PROTEIN: NEGATIVE mg/dL
Specific Gravity, Urine: 1.011 (ref 1.005–1.030)
pH: 7.5 (ref 5.0–8.0)

## 2015-12-28 LAB — I-STAT TROPONIN, ED: TROPONIN I, POC: 0 ng/mL (ref 0.00–0.08)

## 2015-12-28 LAB — BASIC METABOLIC PANEL
Anion gap: 11 (ref 5–15)
BUN: 18 mg/dL (ref 6–20)
CALCIUM: 9.4 mg/dL (ref 8.9–10.3)
CO2: 24 mmol/L (ref 22–32)
Chloride: 104 mmol/L (ref 101–111)
Creatinine, Ser: 0.54 mg/dL (ref 0.44–1.00)
GFR calc Af Amer: >60 mL/min (ref >60)
Glucose, Bld: 92 mg/dL (ref 65–99)
POTASSIUM: 3.9 mmol/L (ref 3.5–5.1)
SODIUM: 139 mmol/L (ref 135–145)

## 2015-12-28 LAB — URINE MICROSCOPIC-ADD ON: RBC / HPF: NONE SEEN RBC/hpf (ref 0–5)

## 2015-12-28 MED ORDER — HYDROMORPHONE HCL 1 MG/ML IJ SOLN
1.0000 mg | Freq: Once | INTRAMUSCULAR | Status: AC
  Administered 2015-12-28: 1 mg via INTRAVENOUS
  Filled 2015-12-28: qty 1

## 2015-12-28 NOTE — Discharge Instructions (Signed)
1. Medications: usual home medications 2. Treatment: rest, drink plenty of fluids 3. Follow Up: please followup with your primary doctor and with neurology (as scheduled tomorrow) for discussion of your diagnoses and further evaluation after today's visit; please return to the ER for new or worsening symptoms   Back Pain, Adult Back pain is very common. The pain often gets better over time. The cause of back pain is usually not dangerous. Most people can learn to manage their back pain on their own.  HOME CARE  Watch your back pain for any changes. The following actions may help to lessen any pain you are feeling:  Stay active. Start with short walks on flat ground if you can. Try to walk farther each day.  Exercise regularly as told by your doctor. Exercise helps your back heal faster. It also helps avoid future injury by keeping your muscles strong and flexible.  Do not sit, drive, or stand in one place for more than 30 minutes.  Do not stay in bed. Resting more than 1-2 days can slow down your recovery.  Be careful when you bend or lift an object. Use good form when lifting:  Bend at your knees.  Keep the object close to your body.  Do not twist.  Sleep on a firm mattress. Lie on your side, and bend your knees. If you lie on your back, put a pillow under your knees.  Take medicines only as told by your doctor.  Put ice on the injured area.  Put ice in a plastic bag.  Place a towel between your skin and the bag.  Leave the ice on for 20 minutes, 2-3 times a day for the first 2-3 days. After that, you can switch between ice and heat packs.  Avoid feeling anxious or stressed. Find good ways to deal with stress, such as exercise.  Maintain a healthy weight. Extra weight puts stress on your back. GET HELP IF:   You have pain that does not go away with rest or medicine.  You have worsening pain that goes down into your legs or buttocks.  You have pain that does not get  better in one week.  You have pain at night.  You lose weight.  You have a fever or chills. GET HELP RIGHT AWAY IF:   You cannot control when you poop (bowel movement) or pee (urinate).  Your arms or legs feel weak.  Your arms or legs lose feeling (numbness).  You feel sick to your stomach (nauseous) or throw up (vomit).  You have belly (abdominal) pain.  You feel like you may pass out (faint).   This information is not intended to replace advice given to you by your health care provider. Make sure you discuss any questions you have with your health care provider.   Document Released: 05/10/2008 Document Revised: 12/13/2014 Document Reviewed: 03/26/2014 Elsevier Interactive Patient Education [employer].

## 2015-12-28 NOTE — ED Provider Notes (Signed)
CSN: [id number]     Arrival date & time 12/28/15  1459 History   First MD Initiated Contact with Patient 12/28/15 1539     No chief complaint on file.   HPI   Penny [name] is a 30 y.o. female with a PMH of migraines, depression, anxiety, seizures who presents to the ED with several complaints. She reports she fell in October, and has had low back pain, bilateral lower extremity pain, and left leg numbness since that time. She reports her symptoms are constant and denies exacerbating or alleviating factors. She reports intermittent urinary incontinence. She denies saddle anesthesia, history of malignancy, IVDU. She also reports she has been having "seizures for a while," and notes her seizures started before the birth of her child in [birth year]. She states she does not remember having any seizures today, and notes she has been taking her carbamazepine and has not missed any doses. She also reports chest pain and intermittent shortness of breath x 1 week. Additionally, she notes migraine headache, and states she has a history of headaches and that her symptoms feel similar. She reports associated dizziness, lightheadedness, black spots in her vision, and nausea. She denies fever, chills, cough, congestion, abdominal pain, vomiting, diarrhea, constipation, dysuria, urgency, frequency. She states she falls frequently due to her left lower extremity numbness. She reports her last fall was yesterday, and that she hit her head. She denies losing consciousness.  Past Medical History  Diagnosis Date  . Migraines   . Interstitial cystitis   . IUD FEB 2010  . Gastritis JULY 2011  . Depression   . 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 Fatty liver   . Obesity (BMI 30-39.9) 2011 228 LBS BMI 36.8  . Iron deficiency anemia 07/23/2010  . Irritable bowel syndrome 2012 DIARRHEA    JUN 2012 TTG IgA 14.9  . Elevated liver enzymes JUL 2011ALK PHOS 111-127 AST  143-267 ALT  213-321T BILI 0.6  ALB  3.7-4.06 Jun 2011  ALK PHOS 118 AST 24 ALT 42 T BILI 0.4 ALB 3.9  . Chronic daily headache   . Ovarian cyst   . ADD (attention deficit disorder)   . RLQ abdominal pain 07/18/2013  . Menorrhagia 07/18/2013  . Anxiety   . Potassium (K) deficiency   . Dysmenorrhea 09/18/2013  . Stress 09/18/2013  . Patient desires pregnancy 09/18/2013  . Pregnant 12/25/2013  . Blood transfusion without reported diagnosis   . Heart murmur   . Gastric bypass status for obesity   . 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 Dysrhythmia   . Sciatica of left side 11/14/2014  . Fibromyalgia   . 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 PONV (postoperative nausea and vomiting) seizure post-operatively  . Psychiatric pseudoseizure   . Depression    Past Surgical History  Procedure Laterality Date  . Gab  2007    in [place]-POUCH 5 CM  . Cholecystectomy  2005    biliary dyskinesia  . Cath self every nite      for sodium bicarb injection (discontinued 2013)  . Colonoscopy  JUN 2012 ABD PN/DIARRHEA WITH PROPOFOL    NL COLON  . Upper gastrointestinal endoscopy  JULY 2011 NAUSEA-D125,V6, PH 25    Bx; GASTRITIS, POUCH-5 CM LONG  . Dilation and curettage of uterus    . Tonsillectomy    . Gastric bypass  06/2006  . Savory dilation  06/20/2012  Dr. Barnie Alderman gastritis/Ulcer in the mid jejunum. Empiric dilation.   . Tonsillectomy and adenoidectomy    . Esophagogastroduodenoscopy    . Wisdom tooth extraction    . Repair vaginal cuff N/A 07/30/2014    Procedure: REPAIR VAGINAL CUFF;  Surgeon: Mora Bellman, MD;  Location: [facility];  Service: Gynecology;  Laterality: N/A;  . Hysteroscopy w/d&c N/A 09/12/2014    Procedure: DILATATION AND CURETTAGE /HYSTEROSCOPY;  Surgeon: Jonnie Kind, MD;  Location: [facility];  Service: Gynecology;  Laterality: N/A;  . Esophagogastroduodenoscopy (egd) with propofol N/A 04/10/2015    Procedure: ESOPHAGOGASTRODUODENOSCOPY (EGD) WITH PROPOFOL;  Surgeon: Daneil Dolin, MD;   Location: [facility];  Service: Endoscopy;  Laterality: N/A;  . Esophageal dilation N/A 04/10/2015    Procedure: ESOPHAGEAL DILATION WITH 54FR MALONEY DILATOR;  Surgeon: Daneil Dolin, MD;  Location: [facility];  Service: Endoscopy;  Laterality: N/A;  . Esophageal biopsy  04/10/2015    Procedure: BIOPSY;  Surgeon: Daneil Dolin, MD;  Location: [facility];  Service: Endoscopy;;   Family History  Problem Relation Age of Onset  . Hemochromatosis Maternal Grandmother   . Migraines Maternal Grandmother   . Cancer Maternal Grandmother   . Breast cancer Maternal Grandmother   . Hypertension Father   . Diabetes Father   . Coronary artery disease Father   . Migraines Paternal Grandmother   . Breast cancer Paternal Grandmother   . Cancer Mother     breast  . Hemochromatosis Mother   . Breast cancer Mother   . Depression Mother   . Anxiety disorder Mother   . Coronary artery disease Paternal Grandfather   . Anxiety disorder Brother   . Bipolar disorder Brother   . Healthy Son   . Healthy Daughter    Social History  Substance Use Topics  . Smoking status: Current Every Day Smoker -- 0.25 packs/day for 6 years    Types: Cigarettes  . Smokeless tobacco: Never Used  . Alcohol Use: 0.0 oz/week    0 Standard drinks or equivalent per week     Comment: once every weeks - maybe a beer with a dinner, rarely   OB History    Gravida Para Term Preterm AB TAB SAB Ectopic Multiple Living   _0 Review of Systems  Constitutional: Negative for fever and chills.  HENT: Negative for congestion.   Eyes: Positive for visual disturbance.  Respiratory: Positive for shortness of breath. Negative for cough.   Cardiovascular: Positive for chest pain.  Gastrointestinal: Positive for nausea. Negative for vomiting, abdominal pain, diarrhea and constipation.  Musculoskeletal: Positive for back pain.  Neurological: Positive for dizziness, seizures, weakness, light-headedness, numbness and headaches.  Negative for syncope.  All other systems reviewed and are negative.     Allergies  Gabapentin; Metoclopramide hcl; Morphine and related; Tramadol; Zofran; Latex; and Tape  Home Medications   Prior to Admission medications   Medication Sig Start Date End Date Taking? Authorizing Provider  carbamazepine (TEGRETOL) 200 MG tablet Take two tablets at bedtime Patient taking differently: Take 400 mg by mouth at bedtime. Take two tablets at bedtime 10/15/15  Yes Cloria Spring, MD  clonazePAM (KLONOPIN) 0.5 MG tablet Take 1 tablet (0.5 mg total) by mouth 3 (three) times daily as needed for anxiety. 10/17/15 10/16/16 Yes Cloria Spring, MD  DULoxetine (CYMBALTA) 60 MG capsule Take 1 capsule (60 mg total) by mouth daily. 10/15/15  Yes  Cloria Spring, MD  HYDROcodone-acetaminophen (NORCO/VICODIN) 5-325 MG tablet Take 1 tablet by mouth every 4 (four) hours as needed. 12/19/15  Yes Nilda Simmer, NP  topiramate (TOPAMAX) 50 MG tablet Take 50-100 mg by mouth 2 (two) times daily. Take 50m in the am and [id number]   Yes Historical Provider, MD  traZODone (DESYREL) 150 MG tablet Take 1 tablet (150 mg total) by mouth at bedtime as needed for sleep. 10/15/15  Yes DeCloria SpringMD  amphetamine-dextroamphetamine (ADDERALL XR) 30 MG 24 hr capsule Take 1 capsule (30 mg total) by mouth daily. 10/15/15   DeCloria SpringMD  APAP-Pamabrom-Pyrilamine (MENSTRUAL PAIN RELIEF) 500-25-15 MG TABS Take 1-2 tablets by mouth daily as needed (for pain).    Historical Provider, MD  ibuprofen (ADVIL,MOTRIN) 800 MG tablet Take 1 tablet (800 mg total) by mouth every 8 (eight) hours as needed for mild pain. Patient not taking: Reported on 11/26/2015 10/20/15   Kristen N Ward, DO  lidocaine (LIDODERM) 5 % Place 1 patch onto the skin daily. Remove & Discard patch within 12 hours or as directed by MD 11/27/15   AbCharlynne CousinsMD    BP 117/81 mmHg  Pulse 81  Temp(Src) 98.4 F (36.9 C) (Oral)  Resp 11  SpO2 100%  LMP  12/21/2015 Physical Exam  Constitutional: She is oriented to person, place, and time. She appears well-developed and well-nourished. No distress.  HENT:  Head: Normocephalic and atraumatic.  Right Ear: External ear normal.  Left Ear: External ear normal.  Nose: Nose normal.  Mouth/Throat: Uvula is midline, oropharynx is clear and moist and mucous membranes are normal.  Eyes: Conjunctivae, EOM and lids are normal. Pupils are equal, round, and reactive to light. Right eye exhibits no discharge. Left eye exhibits no discharge. No scleral icterus.  Neck: Normal range of motion. Neck supple. No spinous process tenderness and no muscular tenderness present. No rigidity.  Cardiovascular: Normal rate, regular rhythm, normal heart sounds, intact distal pulses and normal pulses.   Pulmonary/Chest: Effort normal and breath sounds normal. No respiratory distress. She has no wheezes. She has no rales. She exhibits no tenderness.  Abdominal: Soft. Normal appearance and bowel sounds are normal. She exhibits no distension and no mass. There is no tenderness. There is no rigidity, no rebound and no guarding.  Musculoskeletal: Normal range of motion. She exhibits tenderness. She exhibits no edema.  Diffuse TTP of lumbar spine and paraspinal muscles. No palpable step off or deformity.   Neurological: She is alert and oriented to person, place, and time. A sensory deficit is present. No cranial nerve deficit.  Decreased sensation to light tough to left lower extremity. Decreased strength to left lower extremity.  Skin: Skin is warm, dry and intact. No rash noted. She is not diaphoretic. No erythema. No pallor.  Psychiatric: She has a normal mood and affect. Her speech is normal and behavior is normal.  Nursing note and vitals reviewed.   ED Course  Procedures (including critical care time)  Labs Review Labs Reviewed  URINALYSIS, ROUTINE W REFLEX [facility]COPIC [facility]- Abnormal; Notable for the  following:    APPearance CLOUDY (*)    Leukocytes, UA SMALL (*)    All other components within normal limits  CBC WITH DIFFERENTIAL/PLATELET - Abnormal; Notable for the following:    WBC 3.9 (*)    RBC 3.68 (*)    Hemoglobin 9.8 (*)    HCT 31.1 (*)    Neutro Abs  1.6 (*)    All other components within normal limits  URINE MICROSCOPIC-ADD ON - Abnormal; Notable for the following:    Squamous Epithelial / LPF 6-30 (*)    Bacteria, UA RARE (*)    All other components within normal limits  BASIC METABOLIC PANEL  Randolm Idol, ED    Imaging Review Dg Chest 2 View  12/28/2015  CLINICAL DATA:  History of lupus.  Seizure today. EXAM: CHEST  2 VIEW COMPARISON:  09/12/2014 FINDINGS: Cardiomediastinal silhouette is normal. Mediastinal contours appear intact. There is no evidence of focal airspace consolidation, pleural effusion or pneumothorax. Osseous structures are without acute abnormality. Soft tissues are grossly normal. IMPRESSION: No active cardiopulmonary disease. Electronically Signed   By: Fidela Salisbury M.D.   On: 12/28/2015 17:01     I have personally reviewed and evaluated these images and lab results as part of my medical decision-making.   EKG Interpretation   Date/Time:  Sunday December 28 2015 15:15:51 EST Ventricular Rate:  79 PR Interval:  165 QRS Duration: 100 QT Interval:  401 QTC Calculation: 460 R Axis:   67 Text Interpretation:  Normal sinus rhythm Normal ECG since last tracing no  significant change Confirmed by MILLER  MD, BRIAN ([id number]) on 12/28/2015  5:08:40 PM      MDM   Final diagnoses:  Seizures (HCC)  Back pain, unspecified location  Left leg numbness    30 year old female presents with multiple complaints. She has been evaluated in the ED several times and has had an extensive work-up for her symptoms, which she states are persistent and unchanged. She follows with Dr. Posey Pronto, and has an appointment this week. She has had negative MRI of her  lumbar spine, cervical spine, and brain (December 2016). She had an LP on 1/13 and subsequently had a blood patch placed on 1/18. Per patient, she was told findings may be consistent with lupus. Do not feel additional imaging is indicated at this time given no new symptoms.  Patient is afebrile. Vital signs stable. Heart RRR. Lungs clear to auscultation bilaterally. Abdomen soft, non-tender, non-distended. Diffuse TTP of lumbar spine and paraspinal muscles. No palpable step off or deformity. Decreased sensation to light tough to left lower extremity. Decreased strength to left lower extremity. Patient states this is baseline, and findings have been documented previously. Otherwise, no focal deficit on neurologic exam.  Patient discussed with Dr. Sabra Heck.  EKG sinus rhythm, HR 79. Troponin negative. CBC negative for leukocytosis, hemoglobin 9.8 (last 10.2 1 month ago). BMP unremarkable.  UA small leukocytes, 6-30 WBC, rare bacteria. Patient denies urinary symptoms.  CXR negative for cardiopulmonary disease.  On reassessment of patient, she reports symptom improvement. Patient is non-toxic and well-appearing, feel she is stable for discharge at this time. No seizure activity throughout ED course. Per record review, patient is supposed to be set up with pain management clinic. Patient requesting narcotic pain medication for back pain, however do not feel this is indicated at this time (had hydrocodone filled 1/13). Patient has follow-up with neurology tomorrow. Return precautions discussed. Patient verbalizes her understanding.  BP 117/81 mmHg  Pulse 81  Temp(Src) 98.4 F (36.9 C) (Oral)  Resp 11  SpO2 100%  LMP 12/21/2015    Marella Chimes, PA-C 12/28/15 1928  Noemi Chapel, MD 12/29/15 [phone]

## 2015-12-28 NOTE — ED Notes (Signed)
Permission has been granted by patient for boyfriend Penny [name]) to have access to and be informed about medical information pertaining to her.

## 2015-12-28 NOTE — ED Notes (Signed)
Pt transported to radiology at this time °

## 2015-12-28 NOTE — ED Notes (Signed)
Patient presents from home via EMS for seizures. EMS reports 5 seizures in route, VSS.    20g R forearm, 1mg  Ativan in route.

## 2015-12-28 NOTE — ED Notes (Signed)
Patient A&O x4, c/o lower back pain radiating to bilateral legs, decreased sensation to left leg, pedal pulses intact, denies urinary or bowel incontinence, small reddened area on tip of tongue from possible oral trauma.

## 2015-12-29 ENCOUNTER — Telehealth: Payer: Self-pay | Admitting: *Deleted

## 2015-12-29 LAB — B. BURGDORFI ANTIBODIES, CSF: LYME AB: NEGATIVE

## 2015-12-29 NOTE — Telephone Encounter (Signed)
Patient's mother called wanting to know what to do about her daughter.  I informed her that I have no openings in the office but that I do have patient on the waiting list.  Instructed her to take patient back to ER, urgent care or PCP.  Patient's mom would like to speak to Dr. Posey Pronto.  Informed her that I would send a note back but Dr. Posey Pronto may be gone for the day.

## 2015-12-29 NOTE — Telephone Encounter (Signed)
Returned call to patient's mother.  Results of labs discussed including serum and CSF studies which are all returning normal, except very mild elevated titer 1:80 of ANA.  We will be check dsDNA and SSA/B, which she prefers to be ordered in [place].  Mother states that patient has prolonged "seizure" lasting two hours yesterday and ultimately was taken via EMS to the hospital.  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  She reports that pain is better controlled on low-dose hydrocodone, which is prescribed by her PCP.  Patient and mother concerned that she is not getting better and is unable to care for her two children.   Given that her NCS/EMG was prematurely terminated but did suggest a neuropathy, I ordered CSF testing - no evidence of a polyradiculoneuropathy or inflammatory changes.  Extensive lab testing (see labs tab) has been largely nondiagnostic.   I offered second opinion at the tertiary care center and they prefer [facility] or [facility], so we will initiate a referral.   Caryl Pina, please also send a referral for [facility] case management and social work for social support resources.  Jaydence Arnesen K. Posey Pronto, DO

## 2015-12-29 NOTE — Telephone Encounter (Signed)
Patient called stating that she went to the ER yesterday having seizures, SOB and low back pain.  She said that they did not give her anything or do anything for her nor will her PCP.  Informed her that I will talk to Dr. Posey Pronto and call her back.  I spoke with Dr. Posey Pronto and she said that the only suggestion she has is to call PCP or go back to the ER since we do not have any openings.  She is scheduled to come in on Feb. 17.  Instructed her to keep that appt and we will put her on the waiting list.  Also informed her that Dr. Posey Pronto does not prescribe pain meds nor do any of our doctors in this office.  Patient agreed to go back to ER if her SOB gets worse although I could not hear any SOB.

## 2015-12-30 ENCOUNTER — Emergency Department (HOSPITAL_COMMUNITY): Payer: Medicaid Other

## 2015-12-30 ENCOUNTER — Emergency Department (HOSPITAL_COMMUNITY)
Admission: EM | Admit: 2015-12-30 | Discharge: 2015-12-30 | Disposition: A | Discharge status: Home / Self Care | Payer: Medicaid Other | Attending: Emergency Medicine | Admitting: Emergency Medicine

## 2015-12-30 ENCOUNTER — Encounter (HOSPITAL_COMMUNITY): Payer: Self-pay | Admitting: Emergency Medicine

## 2015-12-30 ENCOUNTER — Other Ambulatory Visit: Payer: Self-pay | Admitting: *Deleted

## 2015-12-30 ENCOUNTER — Telehealth: Payer: Self-pay | Admitting: Family Medicine

## 2015-12-30 DIAGNOSIS — R011 Cardiac murmur, unspecified: Secondary | ICD-10-CM | POA: Insufficient documentation

## 2015-12-30 DIAGNOSIS — Z8742 Personal history of other diseases of the female genital tract: Secondary | ICD-10-CM | POA: Diagnosis not present

## 2015-12-30 DIAGNOSIS — G43909 Migraine, unspecified, not intractable, without status migrainosus: Secondary | ICD-10-CM | POA: Diagnosis not present

## 2015-12-30 DIAGNOSIS — R296 Repeated falls: Secondary | ICD-10-CM | POA: Diagnosis not present

## 2015-12-30 DIAGNOSIS — W1839XA Other fall on same level, initial encounter: Secondary | ICD-10-CM | POA: Insufficient documentation

## 2015-12-30 DIAGNOSIS — Z862 Personal history of diseases of the blood and blood-forming organs and certain disorders involving the immune mechanism: Secondary | ICD-10-CM | POA: Insufficient documentation

## 2015-12-30 DIAGNOSIS — S3992XA Unspecified injury of lower back, initial encounter: Secondary | ICD-10-CM | POA: Diagnosis not present

## 2015-12-30 DIAGNOSIS — M542 Cervicalgia: Secondary | ICD-10-CM

## 2015-12-30 DIAGNOSIS — Y998 Other external cause status: Secondary | ICD-10-CM | POA: Insufficient documentation

## 2015-12-30 DIAGNOSIS — Y9289 Other specified places as the place of occurrence of the external cause: Secondary | ICD-10-CM | POA: Insufficient documentation

## 2015-12-30 DIAGNOSIS — G8929 Other chronic pain: Secondary | ICD-10-CM | POA: Diagnosis not present

## 2015-12-30 DIAGNOSIS — S199XXA Unspecified injury of neck, initial encounter: Secondary | ICD-10-CM | POA: Diagnosis not present

## 2015-12-30 DIAGNOSIS — Z87891 Personal history of nicotine dependence: Secondary | ICD-10-CM | POA: Diagnosis not present

## 2015-12-30 DIAGNOSIS — Z79899 Other long term (current) drug therapy: Secondary | ICD-10-CM | POA: Insufficient documentation

## 2015-12-30 DIAGNOSIS — R531 Weakness: Secondary | ICD-10-CM | POA: Insufficient documentation

## 2015-12-30 DIAGNOSIS — Y9389 Activity, other specified: Secondary | ICD-10-CM | POA: Diagnosis not present

## 2015-12-30 DIAGNOSIS — F329 Major depressive disorder, single episode, unspecified: Secondary | ICD-10-CM | POA: Diagnosis not present

## 2015-12-30 DIAGNOSIS — Z91040 Latex allergy status: Secondary | ICD-10-CM | POA: Insufficient documentation

## 2015-12-30 DIAGNOSIS — R269 Unspecified abnormalities of gait and mobility: Secondary | ICD-10-CM | POA: Diagnosis not present

## 2015-12-30 DIAGNOSIS — F419 Anxiety disorder, unspecified: Secondary | ICD-10-CM | POA: Diagnosis not present

## 2015-12-30 DIAGNOSIS — M545 Low back pain: Secondary | ICD-10-CM

## 2015-12-30 HISTORY — DX: Reserved for concepts with insufficient information to code with codable children: IMO0002

## 2015-12-30 HISTORY — DX: Systemic lupus erythematosus, unspecified: M32.9

## 2015-12-30 MED ORDER — ONDANSETRON HCL 4 MG/2ML IJ SOLN
4.0000 mg | Freq: Once | INTRAMUSCULAR | Status: AC
  Administered 2015-12-30: 4 mg via INTRAVENOUS
  Filled 2015-12-30: qty 2

## 2015-12-30 MED ORDER — FENTANYL CITRATE (PF) 100 MCG/2ML IJ SOLN
50.0000 ug | Freq: Once | INTRAMUSCULAR | Status: AC
  Administered 2015-12-30: 50 ug via INTRAVENOUS
  Filled 2015-12-30: qty 2

## 2015-12-30 MED ORDER — HYDROCODONE-ACETAMINOPHEN 5-325 MG PO TABS: 1.0000 | ORAL_TABLET | ORAL | Status: DC | PRN

## 2015-12-30 MED ORDER — DIAZEPAM 5 MG/ML IJ SOLN
2.5000 mg | Freq: Once | INTRAMUSCULAR | Status: AC
  Administered 2015-12-30: 2.5 mg via INTRAVENOUS
  Filled 2015-12-30: qty 2

## 2015-12-30 MED ORDER — LORAZEPAM 2 MG/ML IJ SOLN
1.0000 mg | Freq: Once | INTRAMUSCULAR | Status: AC
  Administered 2015-12-30: 1 mg via INTRAMUSCULAR
  Filled 2015-12-30: qty 1

## 2015-12-30 NOTE — Telephone Encounter (Signed)
Mother states Penny [name] fell about 2am. Had to call EMS. Went to [place]. Pt's legs are hurting, not able to move them, nausea all day. Has tried phenergan not helping, last night hands were tingling and neck was hurting. Gave shot of fentanyl. Mom states something needs to be done. Calling ems and going to hospital is a waste of money.

## 2015-12-30 NOTE — Telephone Encounter (Signed)
I spoke with patient and instructed her to go have labs done at [facility] since they are put in the system.  Also informed her that [facility] should be calling her about her referral as well as [facility].  Will check into heavy metal labs again.

## 2015-12-30 NOTE — Telephone Encounter (Signed)
Consult with dr Nicki Reaper. Ov tomorrow. Mother notified to check her in and nurse will bring out wheelchair to car. Pt should not try to walk in office. Mother agreed.

## 2015-12-30 NOTE — ED Provider Notes (Signed)
CSN: [id number]     Arrival date & time    History   First MD Initiated Contact with Patient 12/30/15 0347     Chief Complaint  Patient presents with  . Fall     (Consider location/radiation/quality/duration/timing/severity/associated sxs/prior Treatment) HPI   Patient with significant PMH of migraines, interstitial cystitis, IUD, gastritis, depression, hx of pseudoseizures, stress, fibromyalgia, maybe lupus, ADD, IBS and many other medical problems.   Per patient and mom she has had frequent falls over the past few months due to left side weakness which has been significantly worked up by specialists without any cause for her symptoms including nerve conduction tests, MRI, blood work. She says that tonight she fell. She reports laying on her cough and next thing she knew she was awake calling her mother. She knew she had fallen because she had worse pain all over and her walker had turned on its side. She does not remember falling and nothing else appeared out of place. The patient becomes irritable when I further question her fall. She has no bruising, lacerations, contusions or scratches that she has seen she says but is sure she hit her head on the right parietal side when she fell.  She complains of neck pain, low back pain and pelvic pain. She also complains of exacerbation of her usual diffuse pain. Patient states that she did not like the Morphine EMS gave her and already took Ibuprofen. She cannot take Tramadol and Fentanyl also makes her sick. She denies CP, abdominal pain, loss of bowel or urine control, vaginal discharge or bleeding.    Past Medical History  Diagnosis Date  . Migraines   . Interstitial cystitis   . IUD FEB 2010  . Gastritis JULY 2011  . Depression   . 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 Fatty liver   . Obesity (BMI 30-39.9) 2011 228 LBS BMI 36.8  . Iron deficiency anemia 07/23/2010  . Irritable bowel syndrome 2012 DIARRHEA    JUN 2012 TTG IgA 14.9  .  Elevated liver enzymes JUL 2011ALK PHOS 111-127 AST  143-267 ALT  213-321T BILI 0.6  ALB  3.7-4.06 Jun 2011 ALK PHOS 118 AST 24 ALT 42 T BILI 0.4 ALB 3.9  . Chronic daily headache   . Ovarian cyst   . ADD (attention deficit disorder)   . RLQ abdominal pain 07/18/2013  . Menorrhagia 07/18/2013  . Anxiety   . Potassium (K) deficiency   . Dysmenorrhea 09/18/2013  . Stress 09/18/2013  . Patient desires pregnancy 09/18/2013  . Pregnant 12/25/2013  . Blood transfusion without reported diagnosis   . Heart murmur   . Gastric bypass status for obesity   . 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 Dysrhythmia   . Sciatica of left side 11/14/2014  . Fibromyalgia   . 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 PONV (postoperative nausea and vomiting) seizure post-operatively  . Psychiatric pseudoseizure   . Depression   . Lupus (HCC)    Past Surgical History  Procedure Laterality Date  . Gab  2007    in [place]-POUCH 5 CM  . Cholecystectomy  2005    biliary dyskinesia  . Cath self every nite      for sodium bicarb injection (discontinued 2013)  . Colonoscopy  JUN 2012 ABD PN/DIARRHEA WITH PROPOFOL    NL COLON  . Upper gastrointestinal endoscopy  JULY 2011 NAUSEA-D125,V6, PH 25      Bx; GASTRITIS, POUCH-5 CM LONG  . Dilation and curettage of uterus    . Tonsillectomy    . Gastric bypass  06/2006  . Savory dilation  06/20/2012    Dr. Barnie Alderman gastritis/Ulcer in the mid jejunum. Empiric dilation.   . Tonsillectomy and adenoidectomy    . Esophagogastroduodenoscopy    . Wisdom tooth extraction    . Repair vaginal cuff N/A 07/30/2014    Procedure: REPAIR VAGINAL CUFF;  Surgeon: Mora Bellman, MD;  Location: [facility];  Service: Gynecology;  Laterality: N/A;  . Hysteroscopy w/d&c N/A 09/12/2014    Procedure: DILATATION AND CURETTAGE /HYSTEROSCOPY;  Surgeon: Jonnie Kind, MD;  Location: [facility];  Service: Gynecology;  Laterality: N/A;  . Esophagogastroduodenoscopy  (egd) with propofol N/A 04/10/2015    Procedure: ESOPHAGOGASTRODUODENOSCOPY (EGD) WITH PROPOFOL;  Surgeon: Daneil Dolin, MD;  Location: [facility];  Service: Endoscopy;  Laterality: N/A;  . Esophageal dilation N/A 04/10/2015    Procedure: ESOPHAGEAL DILATION WITH 54FR MALONEY DILATOR;  Surgeon: Daneil Dolin, MD;  Location: [facility];  Service: Endoscopy;  Laterality: N/A;  . Esophageal biopsy  04/10/2015    Procedure: BIOPSY;  Surgeon: Daneil Dolin, MD;  Location: [facility];  Service: Endoscopy;;   Family History  Problem Relation Age of Onset  . Hemochromatosis Maternal Grandmother   . Migraines Maternal Grandmother   . Cancer Maternal Grandmother   . Breast cancer Maternal Grandmother   . Hypertension Father   . Diabetes Father   . Coronary artery disease Father   . Migraines Paternal Grandmother   . Breast cancer Paternal Grandmother   . Cancer Mother     breast  . Hemochromatosis Mother   . Breast cancer Mother   . Depression Mother   . Anxiety disorder Mother   . Coronary artery disease Paternal Grandfather   . Anxiety disorder Brother   . Bipolar disorder Brother   . Healthy Son   . Healthy Daughter    Social History  Substance Use Topics  . Smoking status: Former Smoker -- 0.25 packs/day for 6 years    Types: Cigarettes    Quit date: 11/29/2015  . Smokeless tobacco: Never Used  . Alcohol Use: 0.0 oz/week    0 Standard drinks or equivalent per week     Comment: once every weeks - maybe a beer with a dinner, rarely   OB History    Gravida Para Term Preterm AB TAB SAB Ectopic Multiple Living   _0 Review of Systems  Review of Systems All other systems negative except as documented in the HPI. All pertinent positives and negatives as reviewed in the HPI.   Allergies  Gabapentin; Metoclopramide hcl; Morphine and related; Tramadol; Zofran; Latex; and Tape  Home Medications   Prior to Admission medications   Medication Sig Start Date End Date Taking?  Authorizing Provider  amphetamine-dextroamphetamine (ADDERALL XR) 30 MG 24 hr capsule Take 1 capsule (30 mg total) by mouth daily. 10/15/15   Cloria Spring, MD  APAP-Pamabrom-Pyrilamine (MENSTRUAL PAIN RELIEF) 500-25-15 MG TABS Take 1-2 tablets by mouth daily as needed (for pain).    Historical Provider, MD  carbamazepine (TEGRETOL) 200 MG tablet Take two tablets at bedtime Patient taking differently: Take 400 mg by mouth at bedtime. Take two tablets at bedtime 10/15/15   Cloria Spring, MD  clonazePAM (KLONOPIN) 0.5 MG tablet Take 1 tablet (0.5 mg total) by mouth 3 (three)  times daily as needed for anxiety. 10/17/15 10/16/16  Cloria Spring, MD  DULoxetine (CYMBALTA) 60 MG capsule Take 1 capsule (60 mg total) by mouth daily. 10/15/15   Cloria Spring, MD  HYDROcodone-acetaminophen (NORCO/VICODIN) 5-325 MG tablet Take 1 tablet by mouth every 4 (four) hours as needed. 12/19/15   Nilda Simmer, NP  HYDROcodone-acetaminophen (NORCO/VICODIN) 5-325 MG tablet Take 1-2 tablets by mouth every 4 (four) hours as needed. 12/30/15   Toshiko Kemler Carlota Raspberry, PA-C  ibuprofen (ADVIL,MOTRIN) 800 MG tablet Take 1 tablet (800 mg total) by mouth every 8 (eight) hours as needed for mild pain. Patient not taking: Reported on 11/26/2015 10/20/15   Kristen N Ward, DO  lidocaine (LIDODERM) 5 % Place 1 patch onto the skin daily. Remove & Discard patch within 12 hours or as directed by MD 11/27/15   Charlynne Cousins, MD  topiramate (TOPAMAX) 50 MG tablet Take 50-100 mg by mouth 2 (two) times daily. Take 15m in the am and 1049mqhs    Historical Provider, MD  traZODone (DESYREL) 150 MG tablet Take 1 tablet (150 mg total) by mouth at bedtime as needed for sleep. 10/15/15   DeCloria SpringMD   BP 115/71 mmHg  Pulse 87  Temp(Src) 98.2 F (36.8 C) (Oral)  Resp 18  SpO2 97%  LMP 12/21/2015 Physical Exam  Constitutional: She appears well-developed and well-nourished. No distress.  HENT:  Head: Normocephalic and atraumatic.   Right Ear: Tympanic membrane and ear canal normal.  Left Ear: Tympanic membrane and ear canal normal.  Nose: Nose normal.  Mouth/Throat: Uvula is midline, oropharynx is clear and moist and mucous membranes are normal.  Eyes: Pupils are equal, round, and reactive to light.  Neck: Normal range of motion. Neck supple.  Cardiovascular: Normal rate and regular rhythm.   Pulmonary/Chest: Effort normal.  Abdominal: Soft.  No signs of abdominal distention  Musculoskeletal:  Pt reports pain to palpation of any portion of her body that is touched. I see no objective signs of deformities, contusions, lacerations, hematomas, rash, abrasions or signs of trauma to all 4 extremities, scalp, neck, or torso.  Neurological: She is alert.  Exam is limited due to poor effort. Pt does not want to participate in the exam.  Skin: Skin is warm and dry. No rash noted.  Psychiatric: Her mood appears anxious. She does not exhibit a depressed mood.  Nursing note and vitals reviewed.   ED Course  Procedures (including critical care time) Labs Review Labs Reviewed - No data to display  Imaging Review Dg Chest 2 View  12/28/2015  CLINICAL DATA:  History of lupus.  Seizure today. EXAM: CHEST  2 VIEW COMPARISON:  09/12/2014 FINDINGS: Cardiomediastinal silhouette is normal. Mediastinal contours appear intact. There is no evidence of focal airspace consolidation, pleural effusion or pneumothorax. Osseous structures are without acute abnormality. Soft tissues are grossly normal. IMPRESSION: No active cardiopulmonary disease. Electronically Signed   By: DoFidela Salisbury.D.   On: 12/28/2015 17:01   I have personally reviewed and evaluated these images and lab results as part of my medical decision-making.   EKG Interpretation None      MDM   Final diagnoses:  Gait disorder  Cervicalgia  Low back pain, unspecified back pain laterality, with sciatica presence unspecified    Patient given IV Valium for  muscle relaxer and for help with anxiety.  DG pelvis, lumbar and cervical ordered to evaluate fall.   4:50 am- pt sent back from xray because  she refused due to the pain being too severe. She has had 2.5 IV Valium and 5 mg Morphine. She requests Dilaudid and Phenergan. I told her I would give her a small amount of Fentanyl in order to get the xrays and Zofran. Pt advised that she would not be getting Dilaudid at this visit. She becomes tearful and upset. Mom says she knows her daughter is not a drug seeker even though people think so and that there are plans to send her to [facility] since no one knows whats going on.  6:00 am- . Discussed with patient and her mom that if her xrays are normal she will be discharged to back home. Her mom had to leave and therefore her dad or neighbor who is a good friend can get her. Mom understands that because her gait abnormalities and difficulty walking have been ongoing that it is not a reason to keep her today. Mom typically spends the night with her and helps with the kids.   Pt tells me that she hasn't walked for days but she supposedly, fell off the cough and  got back up onto the couch on her without the use of her walker since it was toppled over.  6:22 am - xrays are all negative for any acute findings. Patient to be discharged with #8 Norco Tabs. Pt denied having any questions. She is resting quietly and calmly in the exam room.  Filed Vitals:   12/30/15 0500 12/30/15 0515  BP: 111/56 115/71  Pulse: 89 87  Temp:    Resp: 18 18      , PA-C 12/30/15 0626   7:00 am .Nurse called me into room due to concern for patient having seizure activity, she was having purposeful movement without any post ictal phase. . Patient has had EEG in the past, MRI of the brain and and negative LP cultures. No significant findings. She was apparently started on Tegretol but reports she did not take it yesterday. Case discussed with Dr. Ward prior to  discharge.   , PA-C 12/30/15 0713  Kristen N Ward, DO 12/30/15 0720 

## 2015-12-30 NOTE — ED Notes (Signed)
Pt arrives by [employer]. Pt fell at home, but does not know where. Unsure if she had LOC. Made her way to couch and called mother who called EMS. C/O pain to right side back of neck. Was seen at [facility] 12/28/2015 for seizure. Pt was recently diagnosed with Lupus. EMS placed 20g in L AC, gave 5mg  morphine and 4mg  zofran. Last vitals 122/72, HR 87, O2 98% on RA, RR 18.

## 2015-12-30 NOTE — ED Notes (Signed)
Patient refused to sign discharge. 

## 2015-12-30 NOTE — Telephone Encounter (Signed)
Pt's mom is wanting Dr. Nicki Reaper to call her regarding the pt. Mom states that the pt can not get out of bed and is wondering if the Fentanyl is the reason. Please advise.

## 2015-12-30 NOTE — Discharge Instructions (Signed)
Fall Prevention in the Home  Falls can cause injuries and can affect people from all age groups. There are many simple things that you can do to make your home safe and to help prevent falls. WHAT CAN I DO ON THE OUTSIDE OF MY HOME?  Regularly repair the edges of walkways and driveways and fix any cracks.  Remove high doorway thresholds.  Trim any shrubbery on the main path into your home.  Use bright outdoor lighting.  Clear walkways of debris and clutter, including tools and rocks.  Regularly check that handrails are securely fastened and in good repair. Both sides of any steps should have handrails.  Install guardrails along the edges of any raised decks or porches.  Have leaves, snow, and ice cleared regularly.  Use sand or salt on walkways during winter months.  In the garage, clean up any spills right away, including grease or oil spills. WHAT CAN I DO IN THE BATHROOM?  Use night lights.  Install grab bars by the toilet and in the tub and shower. Do not use towel bars as grab bars.  Use non-skid mats or decals on the floor of the tub or shower.  If you need to sit down while you are in the shower, use a plastic, non-slip stool..  Keep the floor dry. Immediately clean up any water that spills on the floor.  Remove soap buildup in the tub or shower on a regular basis.  Attach bath mats securely with double-sided non-slip rug tape.  Remove throw rugs and other tripping hazards from the floor. WHAT CAN I DO IN THE BEDROOM?  Use night lights.  Make sure that a bedside light is easy to reach.  Do not use oversized bedding that drapes onto the floor.  Have a firm chair that has side arms to use for getting dressed.  Remove throw rugs and other tripping hazards from the floor. WHAT CAN I DO IN THE KITCHEN?   Clean up any spills right away.  Avoid walking on wet floors.  Place frequently used items in easy-to-reach places.  If you need to reach for something  above you, use a sturdy step stool that has a grab bar.  Keep electrical cables out of the way.  Do not use floor polish or wax that makes floors slippery. If you have to use wax, make sure that it is non-skid floor wax.  Remove throw rugs and other tripping hazards from the floor. WHAT CAN I DO IN THE STAIRWAYS?  Do not leave any items on the stairs.  Make sure that there are handrails on both sides of the stairs. Fix handrails that are broken or loose. Make sure that handrails are as long as the stairways.  Check any carpeting to make sure that it is firmly attached to the stairs. Fix any carpet that is loose or worn.  Avoid having throw rugs at the top or bottom of stairways, or secure the rugs with carpet tape to prevent them from moving.  Make sure that you have a light switch at the top of the stairs and the bottom of the stairs. If you do not have them, have them installed. WHAT ARE SOME OTHER FALL PREVENTION TIPS?  Wear closed-toe shoes that fit well and support your feet. Wear shoes that have rubber soles or low heels.  When you use a stepladder, make sure that it is completely opened and that the sides are firmly locked. Have someone hold the ladder while you   are using it. Do not climb a closed stepladder.  Add color or contrast paint or tape to grab bars and handrails in your home. Place contrasting color strips on the first and last steps.  Use mobility aids as needed, such as canes, walkers, scooters, and crutches.  Turn on lights if it is dark. Replace any light bulbs that burn out.  Set up furniture so that there are clear paths. Keep the furniture in the same spot.  Fix any uneven floor surfaces.  Choose a carpet design that does not hide the edge of steps of a stairway.  Be aware of any and all pets.  Review your medicines with your healthcare provider. Some medicines can cause dizziness or changes in blood pressure, which increase your risk of falling. Talk  with your health care provider about other ways that you can decrease your risk of falls. This may include working with a physical therapist or trainer to improve your strength, balance, and endurance.   This information is not intended to replace advice given to you by your health care provider. Make sure you discuss any questions you have with your health care provider.   Document Released: 11/12/2002 Document Revised: 04/08/2015 Document Reviewed: 12/27/2014 Elsevier Interactive Patient Education 2016 [employer]  

## 2015-12-31 ENCOUNTER — Other Ambulatory Visit: Payer: Self-pay | Admitting: *Deleted

## 2015-12-31 ENCOUNTER — Ambulatory Visit (INDEPENDENT_AMBULATORY_CARE_PROVIDER_SITE_OTHER): Payer: Medicaid Other | Admitting: Family Medicine

## 2015-12-31 ENCOUNTER — Encounter: Payer: Self-pay | Admitting: Family Medicine

## 2015-12-31 VITALS — BP 130/80 | Ht 66.0 in

## 2015-12-31 DIAGNOSIS — R768 Other specified abnormal immunological findings in serum: Secondary | ICD-10-CM

## 2015-12-31 DIAGNOSIS — G8314 Monoplegia of lower limb affecting left nondominant side: Secondary | ICD-10-CM

## 2015-12-31 DIAGNOSIS — M792 Neuralgia and neuritis, unspecified: Secondary | ICD-10-CM

## 2015-12-31 DIAGNOSIS — R202 Paresthesia of skin: Secondary | ICD-10-CM | POA: Diagnosis not present

## 2015-12-31 DIAGNOSIS — R29898 Other symptoms and signs involving the musculoskeletal system: Secondary | ICD-10-CM | POA: Diagnosis not present

## 2015-12-31 DIAGNOSIS — F445 Conversion disorder with seizures or convulsions: Secondary | ICD-10-CM

## 2015-12-31 DIAGNOSIS — G6181 Chronic inflammatory demyelinating polyneuritis: Secondary | ICD-10-CM

## 2015-12-31 DIAGNOSIS — R269 Unspecified abnormalities of gait and mobility: Secondary | ICD-10-CM

## 2015-12-31 DIAGNOSIS — G610 Guillain-Barre syndrome: Secondary | ICD-10-CM

## 2015-12-31 MED ORDER — HYDROCODONE-ACETAMINOPHEN 5-325 MG PO TABS: 1.0000 | ORAL_TABLET | Freq: Four times a day (QID) | ORAL | Status: DC | PRN

## 2015-12-31 NOTE — Progress Notes (Signed)
   Subjective:    Patient ID: Penny [name], female    DOB: May 24, 1986, 30 y.o.   MRN: [id number]  Leg Pain  The incident occurred more than 1 week ago. The injury mechanism was a fall. The pain is present in the left leg and right leg. The quality of the pain is described as aching. The pain is moderate. Associated symptoms include an inability to bear weight and a loss of motion. She reports no foreign bodies present. The symptoms are aggravated by movement and weight bearing. She has tried non-weight bearing (pain medications) for the symptoms. The treatment provided mild relief.   Patient was seen at [facility] on Sunday and Tuesday for this issue.    25-30 minutes was spent with this patient today going over her MRIs going over her testing going over her visits with the neurologist and in addition to this discussing her leg pain leg weakness. The patient is very adamant that this is not a psychiatric illness she states she is having legitimate weakness and pain and she just "wants to get better " Review of Systems  she denies high fever chills sweats headaches.    Objective:   Physical Exam  lungs clear heart regular   significant weakness in the left arm. Has difficulty grasping. Has been seen in ER several times recently.  Patient with weakness in the left leg. No significant weakness on the right side.   patient relates subjective pain discomfort  25 minutes was spent with the patient. Greater than half the time was spent in discussion and answering questions and counseling regarding the issues that the patient came in for today.      Assessment & Plan:   severe pain in the legs along with numbness or weakness -MRIs do not explain this patient had partial nerve conduction study with neurology [facility] which did not explain this in addition to this patient will be seeing neurology in [facility] sent hopefully.  Hydrocodone no greater than 4 per day 120- mom will hold onto half  of this prescription if her it when she is gone through the first half,  Patient does have a history of drug miss use but denies seeking drugs currently  Somehow the patient is able to take care of herself mom states she is able to get up and move around when necessary. I have instructed her that she is at high risk of falling in to avoid any type of situations where she could get injured  She is not to drive at all  this patient between the seizures she was having last year which were diagnosed as pseudoseizures in addition to this significant mental health stressors and also this new onset left side weakness along with left lower leg weakness this patient is disabled. She is not capable of holding a job. No one would hire her. I recommend that she file for disability. If her situation improves in the future she possibly can resume working but I do not see that anywhere in the next 6-12 months   patient also with rheumatologic issues of muscle soreness tenderness and a positive ANA the neurologist was concerned about the possibility of lupus we will help set her up with rheumatology at [facility]   Patient will follow-up in one month

## 2016-01-01 NOTE — Addendum Note (Signed)
Addended by: Dairl Ponder on: 01/01/2016 08:21 AM   Modules accepted: Orders

## 2016-01-01 NOTE — Progress Notes (Signed)
I will call [facility] to try to get patient appointment ASAP.

## 2016-01-02 ENCOUNTER — Ambulatory Visit: Payer: Self-pay | Admitting: Neurology

## 2016-01-02 ENCOUNTER — Other Ambulatory Visit: Payer: Self-pay | Admitting: *Deleted

## 2016-01-02 ENCOUNTER — Encounter: Payer: Self-pay | Admitting: Neurology

## 2016-01-02 ENCOUNTER — Ambulatory Visit (INDEPENDENT_AMBULATORY_CARE_PROVIDER_SITE_OTHER): Payer: Medicaid Other | Admitting: Neurology

## 2016-01-02 VITALS — BP 120/78 | HR 78 | Wt 211.1 lb

## 2016-01-02 DIAGNOSIS — R29898 Other symptoms and signs involving the musculoskeletal system: Secondary | ICD-10-CM

## 2016-01-02 DIAGNOSIS — G8314 Monoplegia of lower limb affecting left nondominant side: Secondary | ICD-10-CM

## 2016-01-02 DIAGNOSIS — M792 Neuralgia and neuritis, unspecified: Secondary | ICD-10-CM | POA: Diagnosis not present

## 2016-01-02 DIAGNOSIS — M5432 Sciatica, left side: Secondary | ICD-10-CM

## 2016-01-02 DIAGNOSIS — G6181 Chronic inflammatory demyelinating polyneuritis: Secondary | ICD-10-CM

## 2016-01-02 DIAGNOSIS — G610 Guillain-Barre syndrome: Secondary | ICD-10-CM

## 2016-01-02 DIAGNOSIS — G609 Hereditary and idiopathic neuropathy, unspecified: Secondary | ICD-10-CM

## 2016-01-02 DIAGNOSIS — R269 Unspecified abnormalities of gait and mobility: Secondary | ICD-10-CM

## 2016-01-02 DIAGNOSIS — M797 Fibromyalgia: Secondary | ICD-10-CM

## 2016-01-02 NOTE — Progress Notes (Signed)
Follow-up Visit   Date: 01/02/2016    Penny [name] MRN: [id number] DOB: 1986-01-10   Interim History: Penny [name] is a 30 y.o. right-handed Caucasian female with with major depression, migraines, ADHD, anixety, gastric bypass surgery (2007), fibromyalgia, current tobacco use, and nonepileptic spells returning to the clinic for follow-up of bilateral leg weakness and pain.  The patient was accompanied to the clinic by mother who also provides collateral information.    History of present illness: She reports having a complicated pregnancy with her daughter and developed preeclampsia and was induced at 34.5 weeks (August 2015). She reports having two epidurals which were not completely successful. She reports having difficulty delivering her placenta and was eventually taken to surgery to remove the products. She was in the lithotomy position for 4 hours and was taken to the ICU. The following morning, he had a fall due to left leg weakness. She had back and left leg pain which followed. Imaging of her low back was normal. She was discharged home with a walker and slowly improved over the following weeks.  Since her pregnancy she had other medical issues including spells of nonepileptic events which were diagnosed at [facility].   Starting in October 2016, she had a fall and initially attributed it to ankle instability, but this slowly progressed and she began having more frequent falls. In December 2016, she develop relatively acute onset of left leg weakness/numbness causing another fall and inability to walk which prompted her to go to the emergency department. Over the preceding few days, she also began experiencing similar symptoms over the right leg and numbness of the left side of her face. She also complains of urinary incontinence, described more as urge incontinence as she feels the need to go but cannot control it.   She was admitted to [facility] on 12/21 -  12/22/ where she presented with progressively worsening left leg weakness, numbness, and falls. She had MRI of the brain, cervical, and lumbar spine which essentially was nondiagnostic. Labs indicated folate deficiency and low ferritin levels. She had mild elevation in AST.  Presenting symptoms include: - Low back pain, shooting pain. Laying supine improves the pain, activity exacerbates her pain - Left leg is cold from the knee down, as if there is a "ball at the bottom of the foot" numbness, numbness involves her entire left lower leg, lateral thigh and hip - Left leg weakness from the knee down, with minimal movement of the foot and toes - Falls about daily, even with the walker. She cannot stand to rise independently. - Tingling and numbness of the face and arms. 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 She also has mild transaminitis and followed at [facility]. She also has major depression, anxiety, ADHD, and insomnia and takes tegretol for mood stabilization, Cymbalta for depression, trazodone for sleep. For anxiety and ADHD, she takes Adderall XR 30 mg daily for ADHD.  UPDATE 01/01/2015:  Since her last visit, she underwent CSF testing and lab testing which has essentially returned normal, except for very mild elevation in ANA (1:80 speckled pattern).  dsDNA and heavy metal screen is pending.  She had one ER visit on 1/22 for prolonged "seizure" lasting two hours. These were typical to her previous seizures, which were diagnosed  Follow-up Visit   Date: 01/02/2016    Penny [name] MRN: [id number] DOB: 1986-01-10   Interim History: Penny [name] is a 30 y.o. right-handed Caucasian female with with major depression, migraines, ADHD, anixety, gastric bypass surgery (2007), fibromyalgia, current tobacco use, and nonepileptic spells returning to the clinic for follow-up of bilateral leg weakness and pain.  The patient was accompanied to the clinic by mother who also provides collateral information.    History of present illness: She reports having a complicated pregnancy with her daughter and developed preeclampsia and was induced at 34.5 weeks (August 2015). She reports having two epidurals which were not completely successful. She reports having difficulty delivering her placenta and was eventually taken to surgery to remove the products. She was in the lithotomy position for 4 hours and was taken to the ICU. The following morning, he had a fall due to left leg weakness. She had back and left leg pain which followed. Imaging of her low back was normal. She was discharged home with a walker and slowly improved over the following weeks.  Since her pregnancy she had other medical issues including spells of nonepileptic events which were diagnosed at [facility].   Starting in October 2016, she had a fall and initially attributed it to ankle instability, but this slowly progressed and she began having more frequent falls. In December 2016, she develop relatively acute onset of left leg weakness/numbness causing another fall and inability to walk which prompted her to go to the emergency department. Over the preceding few days, she also began experiencing similar symptoms over the right leg and numbness of the left side of her face. She also complains of urinary incontinence, described more as urge incontinence as she feels the need to go but cannot control it.   She was admitted to [facility] on 12/21 -  12/22/ where she presented with progressively worsening left leg weakness, numbness, and falls. She had MRI of the brain, cervical, and lumbar spine which essentially was nondiagnostic. Labs indicated folate deficiency and low ferritin levels. She had mild elevation in AST.  Presenting symptoms include: - Low back pain, shooting pain. Laying supine improves the pain, activity exacerbates her pain - Left leg is cold from the knee down, as if there is a "ball at the bottom of the foot" numbness, numbness involves her entire left lower leg, lateral thigh and hip - Left leg weakness from the knee down, with minimal movement of the foot and toes - Falls about daily, even with the walker. She cannot stand to rise independently. - Tingling and numbness of the face and arms. 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 She also has mild transaminitis and followed at [facility]. She also has major depression, anxiety, ADHD, and insomnia and takes tegretol for mood stabilization, Cymbalta for depression, trazodone for sleep. For anxiety and ADHD, she takes Adderall XR 30 mg daily for ADHD.  UPDATE 01/01/2015:  Since her last visit, she underwent CSF testing and lab testing which has essentially returned normal, except for very mild elevation in ANA (1:80 speckled pattern).  dsDNA and heavy metal screen is pending.  She had one ER visit on 1/22 for prolonged "seizure" lasting two hours. These were typical to her previous seizures, which were diagnosed  Follow-up Visit   Date: 01/02/2016    Penny [name] MRN: [id number] DOB: 1986-01-10   Interim History: Penny [name] is a 30 y.o. right-handed Caucasian female with with major depression, migraines, ADHD, anixety, gastric bypass surgery (2007), fibromyalgia, current tobacco use, and nonepileptic spells returning to the clinic for follow-up of bilateral leg weakness and pain.  The patient was accompanied to the clinic by mother who also provides collateral information.    History of present illness: She reports having a complicated pregnancy with her daughter and developed preeclampsia and was induced at 34.5 weeks (August 2015). She reports having two epidurals which were not completely successful. She reports having difficulty delivering her placenta and was eventually taken to surgery to remove the products. She was in the lithotomy position for 4 hours and was taken to the ICU. The following morning, he had a fall due to left leg weakness. She had back and left leg pain which followed. Imaging of her low back was normal. She was discharged home with a walker and slowly improved over the following weeks.  Since her pregnancy she had other medical issues including spells of nonepileptic events which were diagnosed at [facility].   Starting in October 2016, she had a fall and initially attributed it to ankle instability, but this slowly progressed and she began having more frequent falls. In December 2016, she develop relatively acute onset of left leg weakness/numbness causing another fall and inability to walk which prompted her to go to the emergency department. Over the preceding few days, she also began experiencing similar symptoms over the right leg and numbness of the left side of her face. She also complains of urinary incontinence, described more as urge incontinence as she feels the need to go but cannot control it.   She was admitted to [facility] on 12/21 -  12/22/ where she presented with progressively worsening left leg weakness, numbness, and falls. She had MRI of the brain, cervical, and lumbar spine which essentially was nondiagnostic. Labs indicated folate deficiency and low ferritin levels. She had mild elevation in AST.  Presenting symptoms include: - Low back pain, shooting pain. Laying supine improves the pain, activity exacerbates her pain - Left leg is cold from the knee down, as if there is a "ball at the bottom of the foot" numbness, numbness involves her entire left lower leg, lateral thigh and hip - Left leg weakness from the knee down, with minimal movement of the foot and toes - Falls about daily, even with the walker. She cannot stand to rise independently. - Tingling and numbness of the face and arms. 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 She also has mild transaminitis and followed at [facility]. She also has major depression, anxiety, ADHD, and insomnia and takes tegretol for mood stabilization, Cymbalta for depression, trazodone for sleep. For anxiety and ADHD, she takes Adderall XR 30 mg daily for ADHD.  UPDATE 01/01/2015:  Since her last visit, she underwent CSF testing and lab testing which has essentially returned normal, except for very mild elevation in ANA (1:80 speckled pattern).  dsDNA and heavy metal screen is pending.  She had one ER visit on 1/22 for prolonged "seizure" lasting two hours. These were typical to her previous seizures, which were diagnosed  Follow-up Visit   Date: 01/02/2016    Penny [name] MRN: [id number] DOB: 1986-01-10   Interim History: Penny [name] is a 30 y.o. right-handed Caucasian female with with major depression, migraines, ADHD, anixety, gastric bypass surgery (2007), fibromyalgia, current tobacco use, and nonepileptic spells returning to the clinic for follow-up of bilateral leg weakness and pain.  The patient was accompanied to the clinic by mother who also provides collateral information.    History of present illness: She reports having a complicated pregnancy with her daughter and developed preeclampsia and was induced at 34.5 weeks (August 2015). She reports having two epidurals which were not completely successful. She reports having difficulty delivering her placenta and was eventually taken to surgery to remove the products. She was in the lithotomy position for 4 hours and was taken to the ICU. The following morning, he had a fall due to left leg weakness. She had back and left leg pain which followed. Imaging of her low back was normal. She was discharged home with a walker and slowly improved over the following weeks.  Since her pregnancy she had other medical issues including spells of nonepileptic events which were diagnosed at [facility].   Starting in October 2016, she had a fall and initially attributed it to ankle instability, but this slowly progressed and she began having more frequent falls. In December 2016, she develop relatively acute onset of left leg weakness/numbness causing another fall and inability to walk which prompted her to go to the emergency department. Over the preceding few days, she also began experiencing similar symptoms over the right leg and numbness of the left side of her face. She also complains of urinary incontinence, described more as urge incontinence as she feels the need to go but cannot control it.   She was admitted to [facility] on 12/21 -  12/22/ where she presented with progressively worsening left leg weakness, numbness, and falls. She had MRI of the brain, cervical, and lumbar spine which essentially was nondiagnostic. Labs indicated folate deficiency and low ferritin levels. She had mild elevation in AST.  Presenting symptoms include: - Low back pain, shooting pain. Laying supine improves the pain, activity exacerbates her pain - Left leg is cold from the knee down, as if there is a "ball at the bottom of the foot" numbness, numbness involves her entire left lower leg, lateral thigh and hip - Left leg weakness from the knee down, with minimal movement of the foot and toes - Falls about daily, even with the walker. She cannot stand to rise independently. - Tingling and numbness of the face and arms. 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 She also has mild transaminitis and followed at [facility]. She also has major depression, anxiety, ADHD, and insomnia and takes tegretol for mood stabilization, Cymbalta for depression, trazodone for sleep. For anxiety and ADHD, she takes Adderall XR 30 mg daily for ADHD.  UPDATE 01/01/2015:  Since her last visit, she underwent CSF testing and lab testing which has essentially returned normal, except for very mild elevation in ANA (1:80 speckled pattern).  dsDNA and heavy metal screen is pending.  She had one ER visit on 1/22 for prolonged "seizure" lasting two hours. These were typical to her previous seizures, which were diagnosed

## 2016-01-02 NOTE — Patient Instructions (Addendum)
We will try to set you up in a physical therapy program We will call you with the results of the lab testing EMG of the left arm and leg Return to clinic in 2 months

## 2016-01-05 ENCOUNTER — Telehealth (HOSPITAL_COMMUNITY): Payer: Self-pay | Admitting: *Deleted

## 2016-01-05 ENCOUNTER — Telehealth: Payer: Self-pay | Admitting: Neurology

## 2016-01-05 IMAGING — CT CT HEAD W/O CM
1 series · 16 of 30 positions shown, 20 images · non-contrast
Comparison: 05/09/2015

CLINICAL DATA: Fall, hit head on dresser history of seizures

EXAM:
CT HEAD WITHOUT CONTRAST
TECHNIQUE: Contiguous axial images were obtained from the base of the skull
through the vertex without intravenous contrast.

[Series 2: headtrauma 4.8 h37s · axial · 0.43mm/px · z∈[+107,+236]mm · 16 of 30 slices shown, 20 images]
[im 2/30  brain]
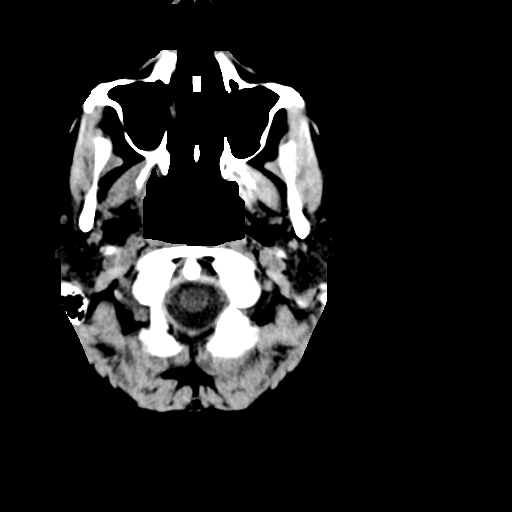
[im 2/30  bone]
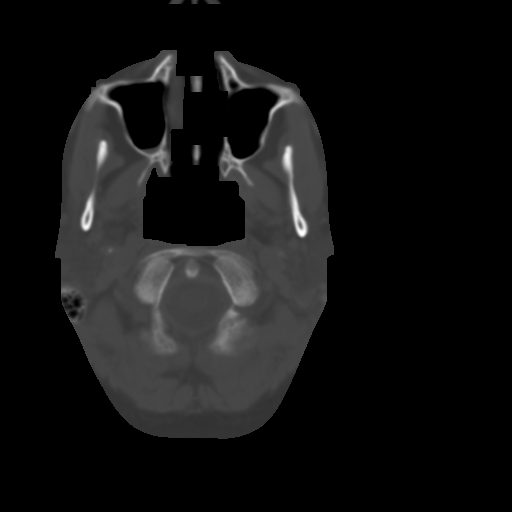
[im 4/30  brain]
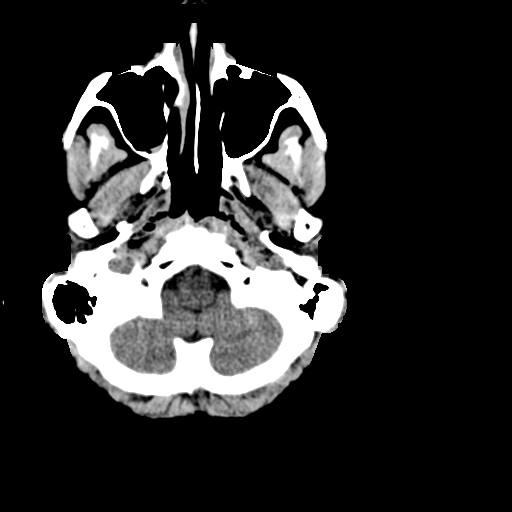
[im 6/30  brain]
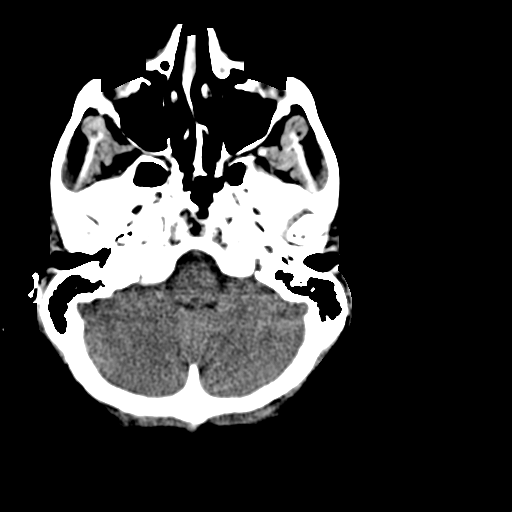
[im 8/30  brain]
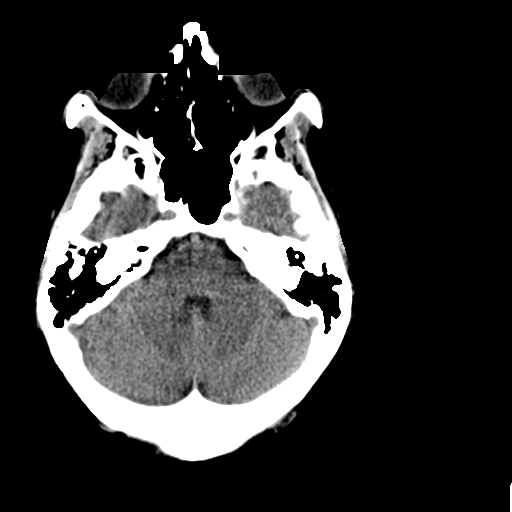
[im 9/30  brain]
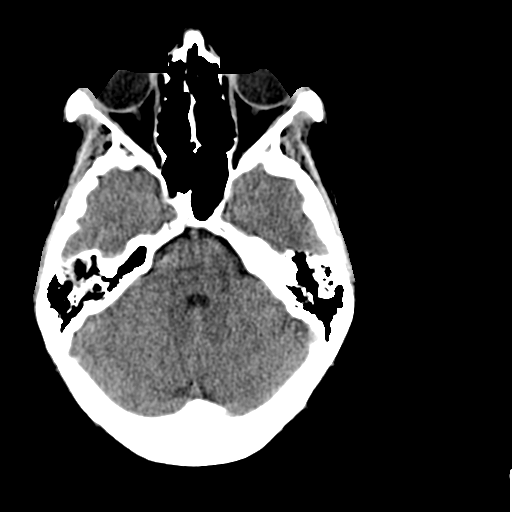
[im 9/30  bone]
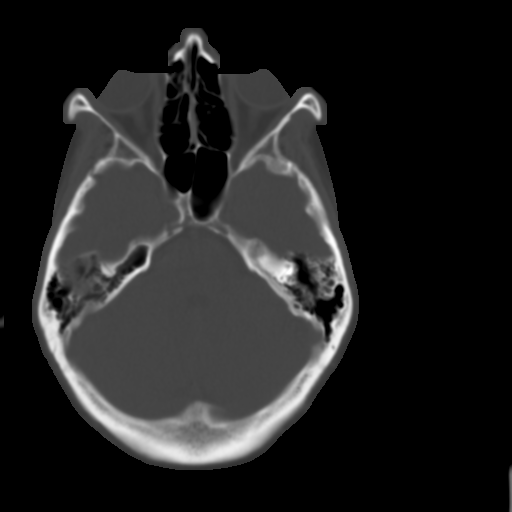
[im 11/30  brain]
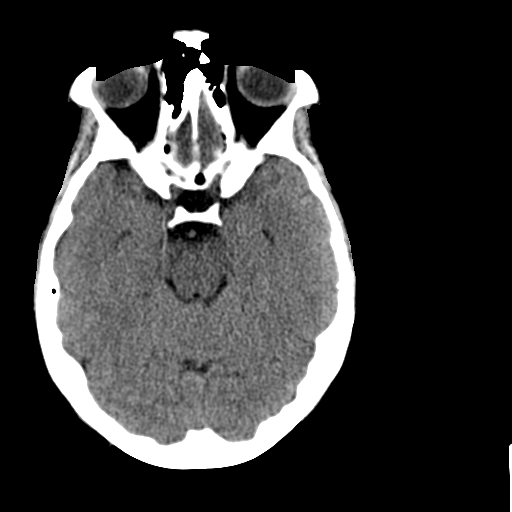
[im 13/30  brain]
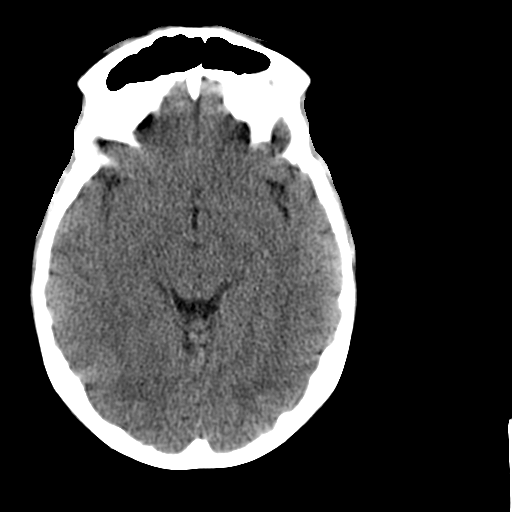
[im 15/30  brain]
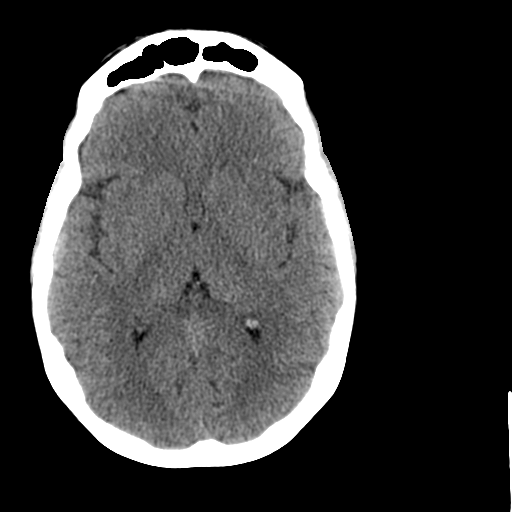
[im 16/30  brain]
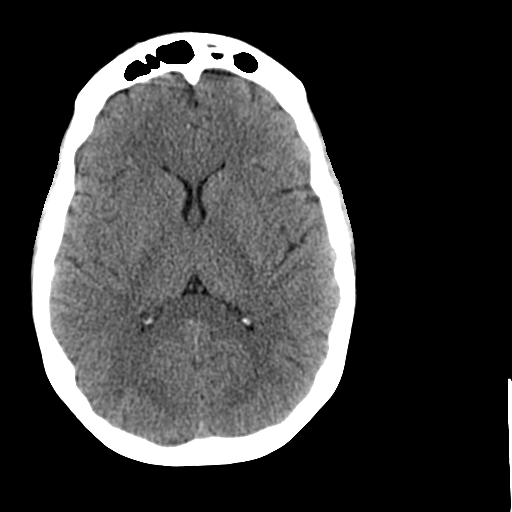
[im 16/30  bone]
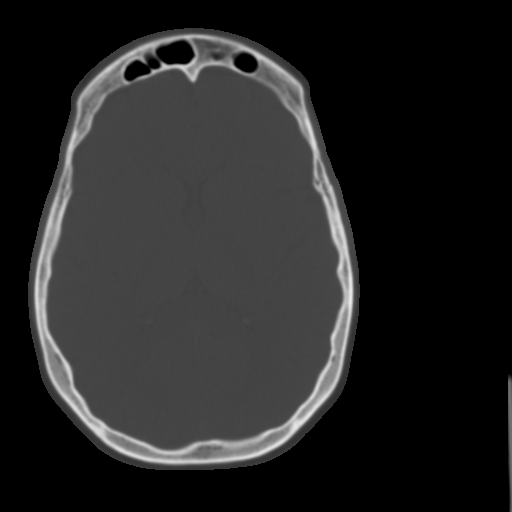
[im 18/30  brain]
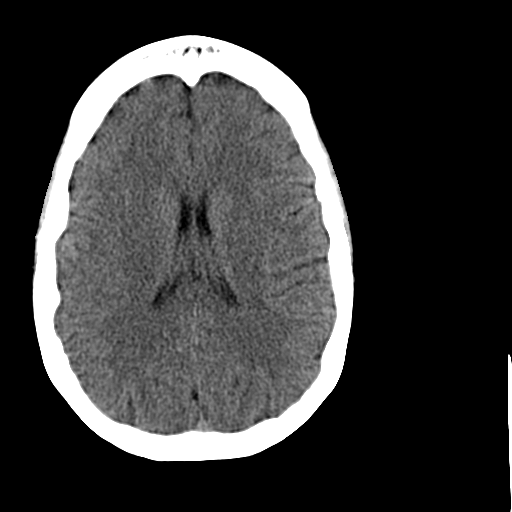
[im 20/30  brain]
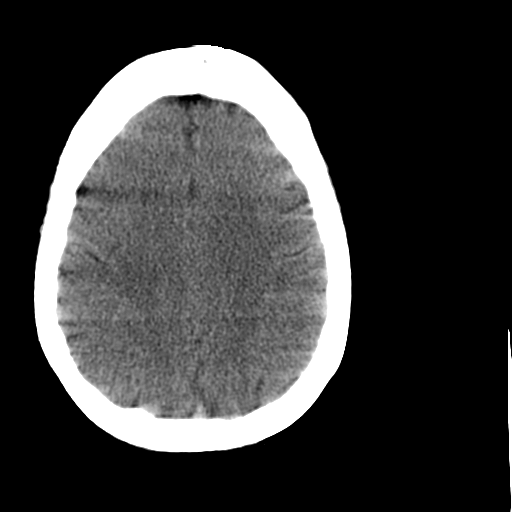
[im 22/30  brain]
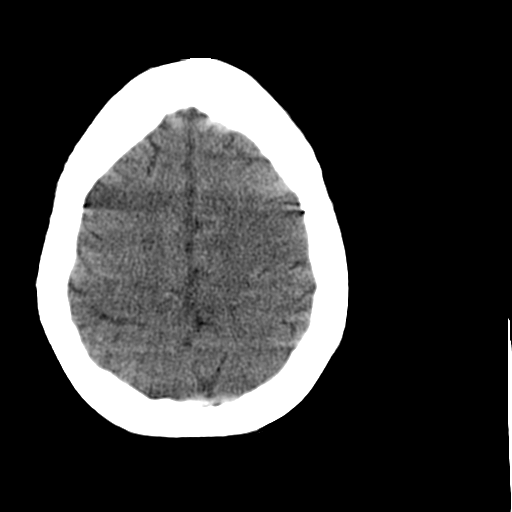
[im 23/30  brain]
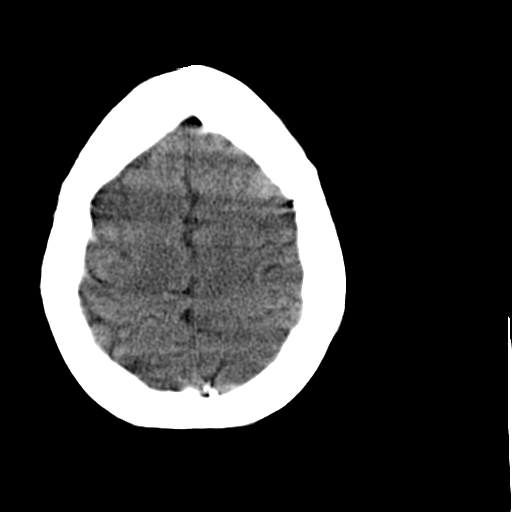
[im 23/30  bone]
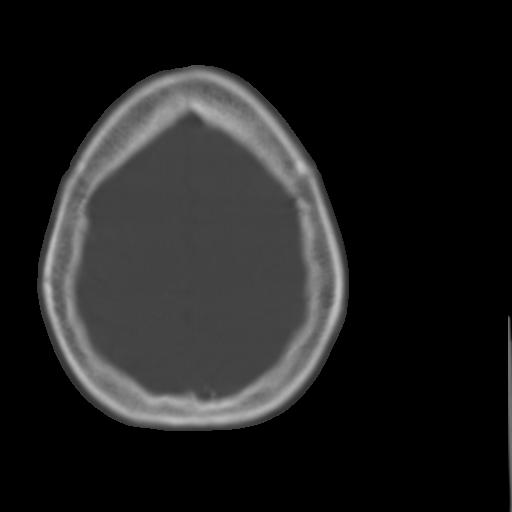
[im 25/30  brain]
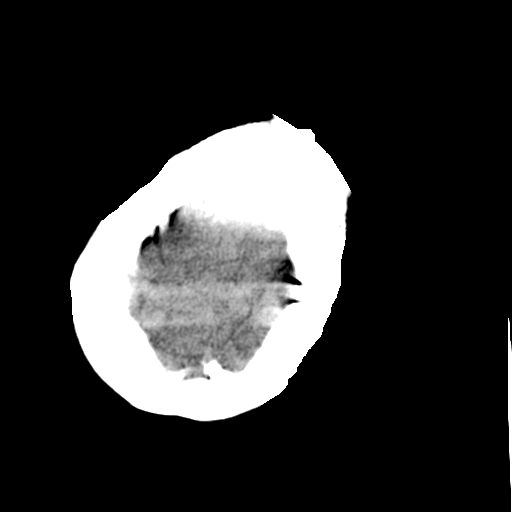
[im 27/30  brain]
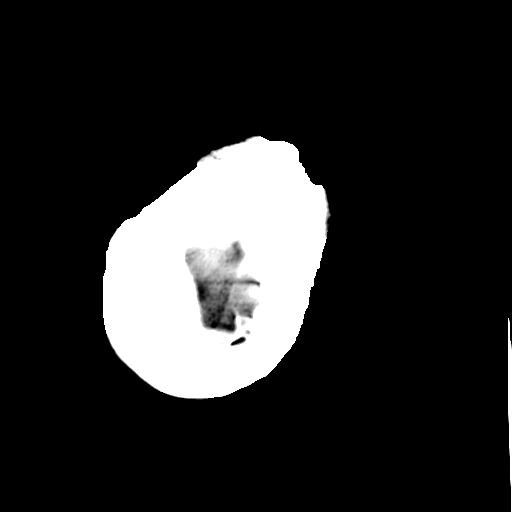
[im 29/30  brain]
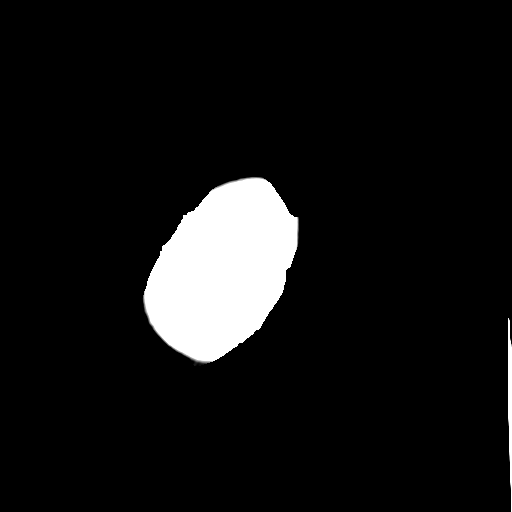

[16 of 30 positions shown; findings below may reference images not displayed]

FINDINGS: No skull fracture is noted. Again noted mild mucosal thickening and
tiny air-fluid level in right maxillary sinus. The mastoid air cells
are unremarkable.

No intracranial hemorrhage, mass effect or midline shift. No acute
cortical infarction. No mass lesion is noted on this unenhanced
scan. There are motion artifacts images of the high convexity
vertex. No hydrocephalus.
IMPRESSION: No acute intracranial abnormality. Motion artifacts are noted last
images high convexity vertex. Again noticed small mucosal thickening
in right maxillary sinus.

## 2016-01-05 NOTE — Telephone Encounter (Signed)
Left message for Melissa to call me back.

## 2016-01-05 NOTE — Telephone Encounter (Signed)
Melissa from [facility] called about pt please call [phone]

## 2016-01-06 ENCOUNTER — Encounter: Payer: Self-pay | Admitting: Neurology

## 2016-01-07 ENCOUNTER — Encounter: Payer: Self-pay | Admitting: Family Medicine

## 2016-01-09 ENCOUNTER — Telehealth: Payer: Self-pay | Admitting: Neurology

## 2016-01-09 ENCOUNTER — Telehealth (HOSPITAL_COMMUNITY): Payer: Self-pay | Admitting: *Deleted

## 2016-01-09 ENCOUNTER — Telehealth: Payer: Self-pay | Admitting: Family Medicine

## 2016-01-09 ENCOUNTER — Other Ambulatory Visit (HOSPITAL_COMMUNITY): Payer: Self-pay | Admitting: Psychiatry

## 2016-01-09 MED ORDER — CLONAZEPAM 0.5 MG PO TABS: 0.5000 mg | ORAL_TABLET | Freq: Three times a day (TID) | ORAL | Status: DC | PRN

## 2016-01-09 MED ORDER — TIZANIDINE HCL 4 MG PO TABS: 4.0000 mg | ORAL_TABLET | Freq: Three times a day (TID) | ORAL | Status: DC | PRN

## 2016-01-09 NOTE — Telephone Encounter (Signed)
You may call in one month supply. She still needs to make appt

## 2016-01-09 NOTE — Telephone Encounter (Signed)
Per pt, she would like to know if office could call in her Klonopin because she's not able to come to office to pick script up.

## 2016-01-09 NOTE — Telephone Encounter (Signed)
[facility] appointment is not until June.  She is on the waiting list.  Instructed her to stay on the waiting list for now.  We may change her to [facility].

## 2016-01-09 NOTE — Telephone Encounter (Signed)
Due to pt complexity scott to see to authorize long term high amnt  Willing to rx short term zanaflex if nwwd ed for a flare numb 30 one tid prn muscle spasm

## 2016-01-09 NOTE — Telephone Encounter (Addendum)
Per Dr. Harrington Challenger to call in one month worth. Spoke with April from pharmacy and she stated she will get med ready for pt. Pt is aware of appt and medication being called in to her pharmacy

## 2016-01-09 NOTE — Telephone Encounter (Signed)
Pt needs to talk to you about referral please call [phone]

## 2016-01-09 NOTE — Telephone Encounter (Signed)
Notified patient willing to rx short term zanaflex if need ed for a flare numb 30 one tid prn muscle spasm. Med sent to pharmacy. Patient verbalized understanding.

## 2016-01-09 NOTE — Telephone Encounter (Signed)
tizanidine (ZANAFLEX) 2 MG capsule  Pt is wanting a refill on this med for muscle spasms but states  She would like to go back to the 4 mg dosage with 90 day supply  Advised Dr Nicki Reaper is not here today she would like a call if we  Are going to fill this today or if we are not.   She can not drive and has to send someone to go get it for her

## 2016-01-09 NOTE — Telephone Encounter (Signed)
Pt called stating lately she have been sick and not feeling well. 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 Per pt, she would like to know if Dr. Harrington Challenger could fill her Klonopin until she f/u on 01-21-16. Pt number is [phone].

## 2016-01-09 NOTE — Telephone Encounter (Signed)
Notified patient due to complexity Dr. Nicki Reaper to see to authorize long term high amount

## 2016-01-09 NOTE — Telephone Encounter (Signed)
Called pt medication into her pharmacy. Spoke with April and she stated she will get pt medication ready for her. Called pt to inform her that medication was sent in and she showed understanding. Per pt, she's not getting her appt reminder or text messages like she use to. Per pt, she gets reminder for her son just not for her. Verified pt number with her

## 2016-01-10 NOTE — Telephone Encounter (Signed)
I believe that 30 tablets at a time with [name] refilling when requested would be the better route for now. I would not prefer for this patient be on this medicine day and day out

## 2016-01-12 NOTE — Telephone Encounter (Signed)
Called patient and informed her per Dr.Scott Luking-I believe that 30 tablets at a time with [name] refilling when requested would be the better route for now. I would not prefer for this patient be on this medicine day and day out. Patient verbalized understanding.

## 2016-01-16 LAB — FUNGUS CULTURE W SMEAR: Smear Result: NONE SEEN

## 2016-01-17 IMAGING — MR MR CERVICAL SPINE WO/W CM
6 of 8 series · 28 of 48 positions shown · IV contrast (multihance)
Comparison: Prior study from 12/19/2014.

CLINICAL DATA: Initial evaluation for bilateral upper extremity
paresthesias.

EXAM:
MRI CERVICAL SPINE WITHOUT AND WITH CONTRAST
TECHNIQUE: Multiplanar and multiecho pulse sequences of the cervical spine, to
include the craniocervical junction and cervicothoracic junction,
were obtained according to standard protocol without and with
intravenous contrast.
CONTRAST:  20mL MULTIHANCE GADOBENATE DIMEGLUMINE 529 MG/ML IV SOLN

[Series 2: T2 · sagittal · 3.0mm · 0.43mm/px · 3 of 12 slices shown (1 of 2)]
[im 1/12]
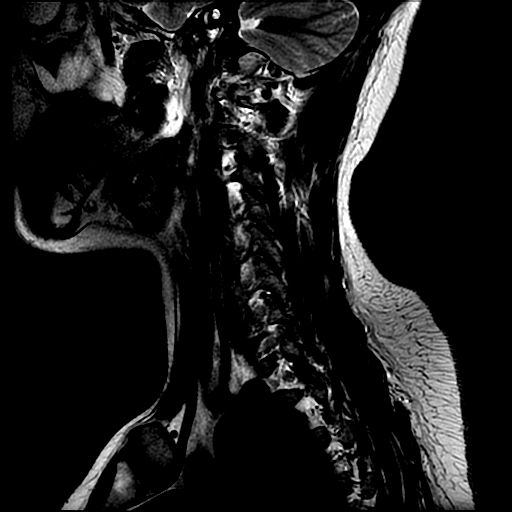
[im 6/12]
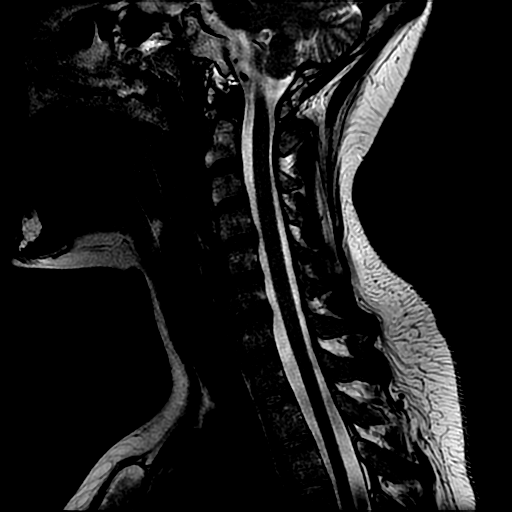
[im 12/12]
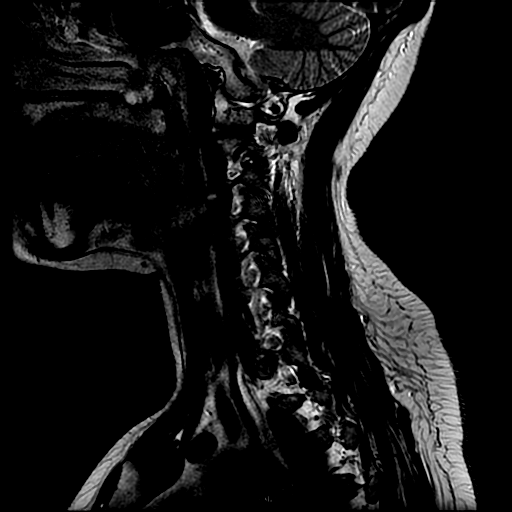

[Series 3: T1 · sagittal · 3.0mm · 0.43mm/px · 3 of 12 slices shown (1 of 2)]
[im 1/12]
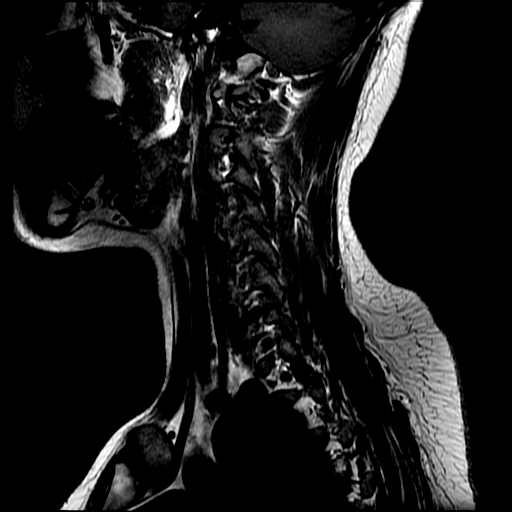
[im 6/12]
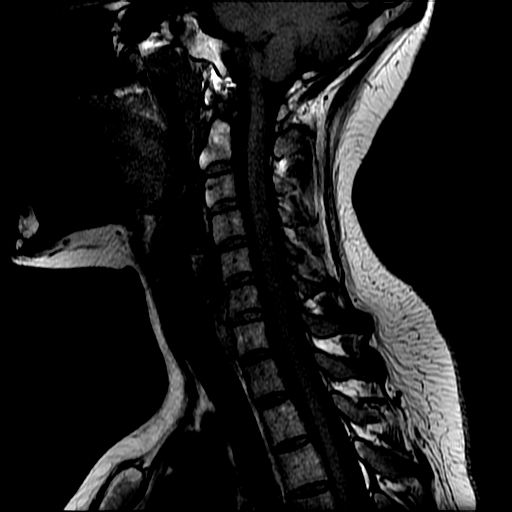
[im 12/12]
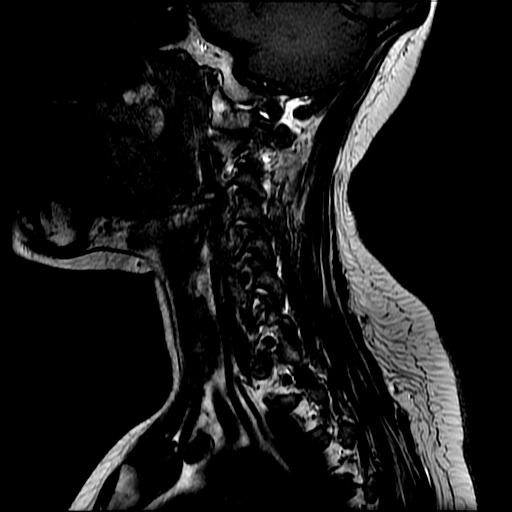

[Series 7: T2 · axial · 3.0mm · 0.39mm/px · z∈[-50,+47]mm · 9 of 30 slices shown (2 of 2)]
[im 1/30]
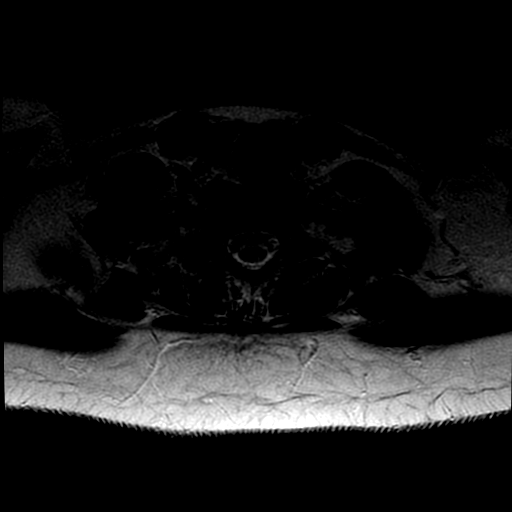
[im 4/30]
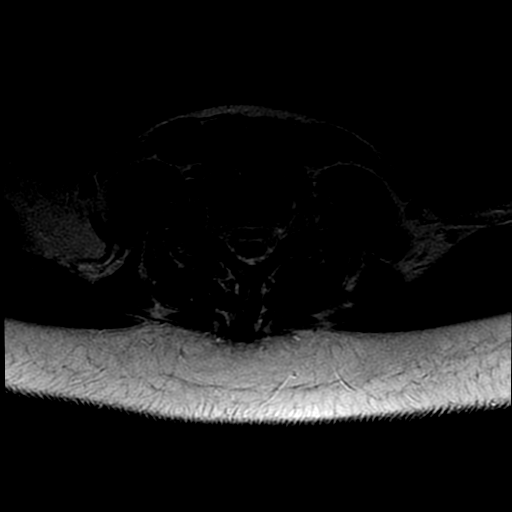
[im 8/30]
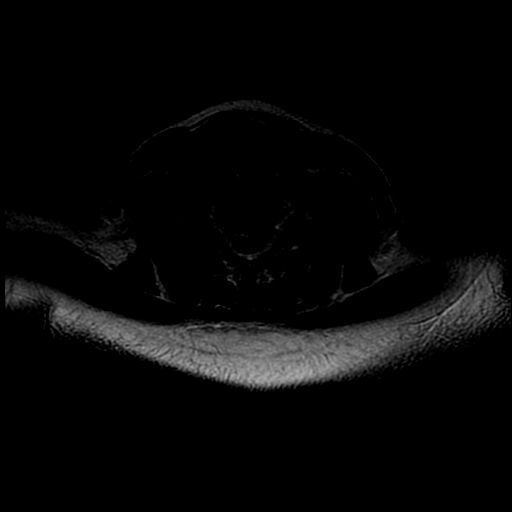
[im 11/30]
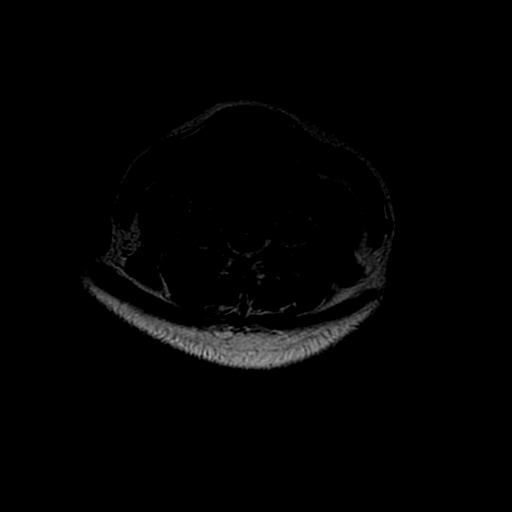
[im 15/30]
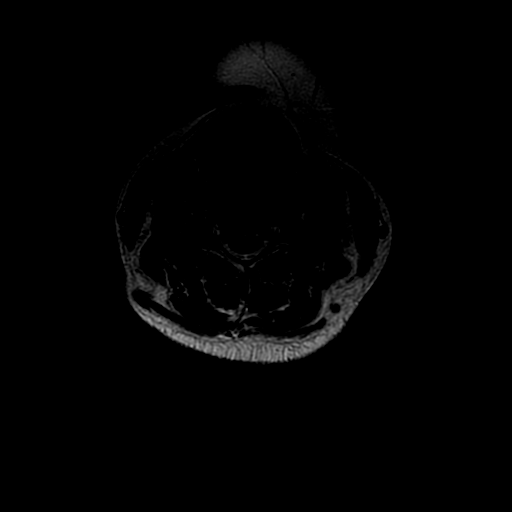
[im 19/30]
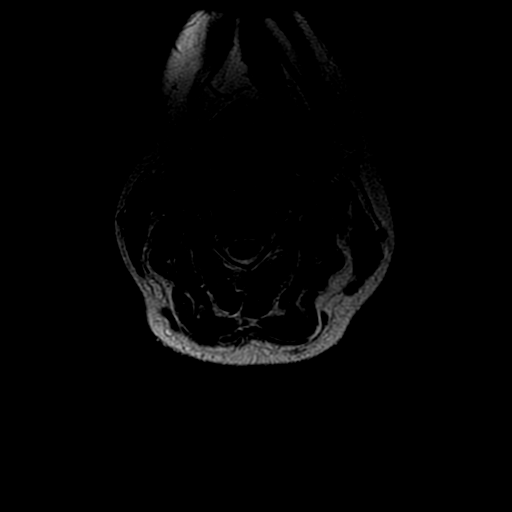
[im 22/30]
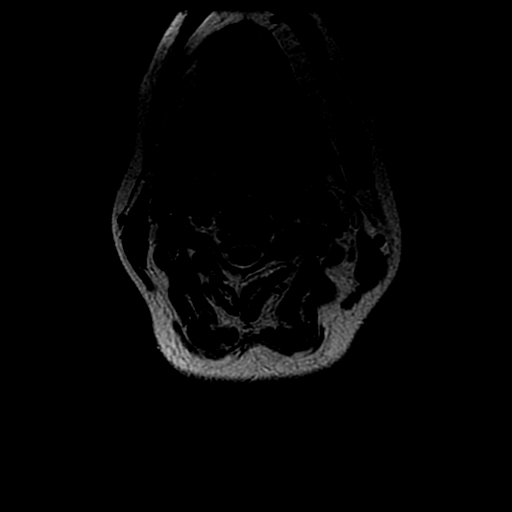
[im 26/30]
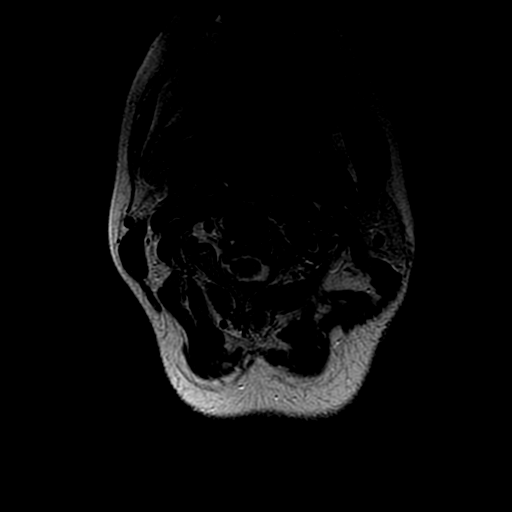
[im 30/30]
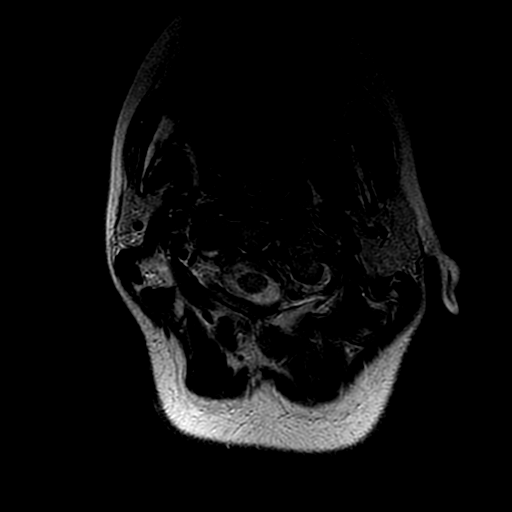

[Series 8: T1 · axial · non-contrast · 3.0mm · 0.78mm/px · z∈[-50,+47]mm · 9 of 30 slices shown (2 of 2)]
[im 1/30]
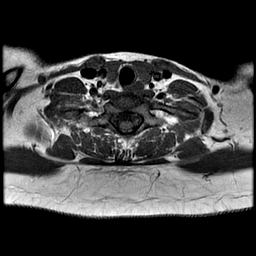
[im 4/30]
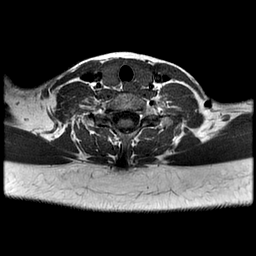
[im 8/30]
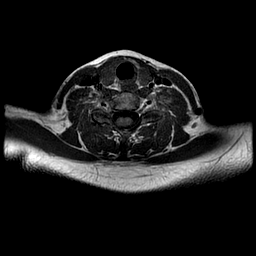
[im 11/30]
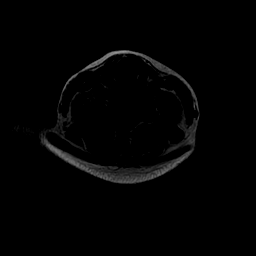
[im 15/30]
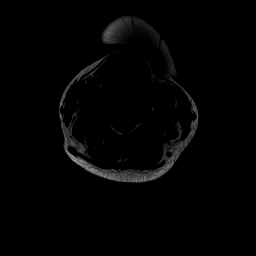
[im 19/30]
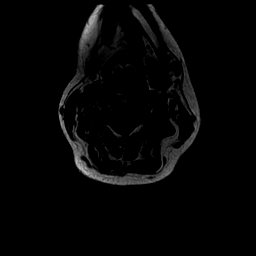
[im 22/30]
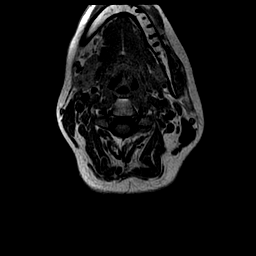
[im 26/30]
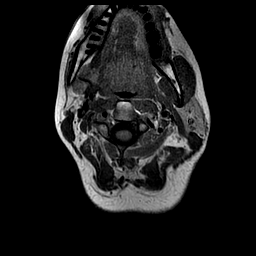
[im 30/30]
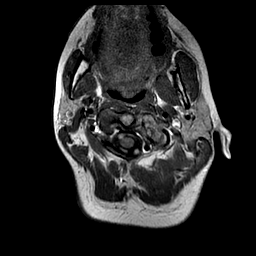

[Series 9: T1 fat-sat post-contrast · sagittal · 3.0mm · 0.86mm/px · 3 of 12 slices shown]
[im 1/12]
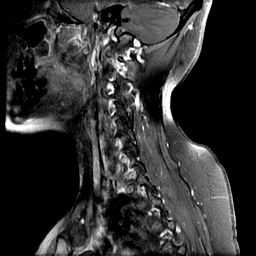
[im 6/12]
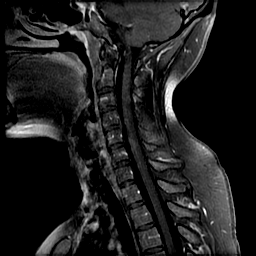
[im 12/12]
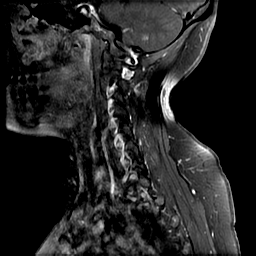

[Series 10: T1 post-contrast · axial · 3.0mm · 0.78mm/px · 1 of 30 slices shown]
[im 1/30]
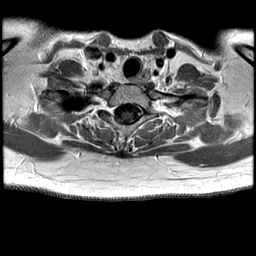

[28 of 48 positions shown; findings below may reference images not displayed]

FINDINGS: Visualized portions of the brain and posterior fossa demonstrate a
normal appearance with normal signal intensity. Craniocervical
junction normal.

Vertebral bodies are normally aligned with preservation of the
normal cervical lordosis paravertebral body heights are preserved.
No fracture or listhesis. Signal intensity within the vertebral body
bone marrow is normal. No focal osseous lesion. No abnormal
enhancement.

Signal intensity within the cervical spinal cord is normal.

Paraspinous soft tissues are normal. No prevertebral edema.
Maxillary sinus disease noted.

C2-C3: Negative.

C3-C4:  Negative.

C4-C5: Broad-based posterior disc protrusion flattens and mildly
effaces the ventral thecal sac without significant stenosis or
evidence of neural impingement. Superimposed mild bilateral
uncovertebral spurring. Minimal bilateral foraminal narrowing.

C5-C6:  Negative.

C6-C7: Tiny central disc protrusion without stenosis or evidence of
neural impingement. Foramina widely patent.

C7-T1:  Negative.

Visualized upper thoracic spine is normal.
IMPRESSION: 1. No acute abnormality within the cervical spine. No significant
stenosis or cord signal abnormality.
2. Posterior disc protrusion at C4-5 without significant stenosis or
evidence of neural impingement.
3. Tiny central disc protrusion at C6-7 without stenosis.

## 2016-01-20 ENCOUNTER — Ambulatory Visit (INDEPENDENT_AMBULATORY_CARE_PROVIDER_SITE_OTHER): Payer: Medicaid Other | Admitting: Neurology

## 2016-01-20 DIAGNOSIS — M792 Neuralgia and neuritis, unspecified: Secondary | ICD-10-CM | POA: Diagnosis not present

## 2016-01-20 DIAGNOSIS — G610 Guillain-Barre syndrome: Secondary | ICD-10-CM

## 2016-01-20 DIAGNOSIS — R29898 Other symptoms and signs involving the musculoskeletal system: Secondary | ICD-10-CM

## 2016-01-20 DIAGNOSIS — G609 Hereditary and idiopathic neuropathy, unspecified: Secondary | ICD-10-CM

## 2016-01-20 NOTE — Procedures (Signed)
[facility]  [place], [place]  [place], [place] [address] Tel: [phone] Fax:  [phone] Test Date:  01/20/2016  [name] DOB: 03/08/1986 Physician: Narda Amber  Sex: Female Height: 5\' 7"  Ref Phys: Narda Amber  ID#: [id number] Temp: 34.4C Technician: Jerilynn Mages. Dean   Patient Complaints: 30 year old female referred for evaluation of generalized paresthesias and weakness. Previous electrodiagnostic testing was concerning for a polyradiculoneuropathy affecting the lower extremities.  NCV & EMG Findings: Extensive electrodiagnostic testing of the left upper and lower extremity shows: 1. Left median and ulnar sensory responses show prolonged latency (3.9, 3.8 ms) and reduced amplitude (15.6, 10.6 V).  Left radial sensory responses within normal limits. 2. Left median motor response shows prolonged latency (5.0 ms), reduced amplitude (2.2 mV), and normal conduction velocity.  Left ulnar motor response shows normal latency, reduced amplitude (6.9 mV), and slowed conduction velocity especially across the elbow (32 m/s).  There is no conduction block or temporal dispersion. 3. Left sural and superficial peroneal sensory responses remain absent. 4. Left peroneal motor response recording at the extensor digitorum brevis continues to show markedly reduced amplitude, prolonged latency, and conduction velocity slowing. The peroneal motor response recording at the tibialis anterior is within normal limits. The left tibial motor response shows reduced amplitude and conduction velocity slowing.  When compared to her previous study dated 12/11/2015, motor responses are stable and there has not been any interval worsening. 5. In the upper extremity, there is no evidence of active or chronic motor axon loss changes affecting any of the tested muscles. Motor unit configuration and recruitment pattern is within normal limits. 6. In the lower extremity, no motor units were recruited  in the flexor digitorum longus, medial gastrocnemius, biceps femoris short head, and rectus femoris muscles. Fibrillation potentials are less active in the tibialis anterior muscle as compared to the previous study and there is motor unit recruitment seen in this muscle which was previously absent.  Active motor axon loss changes are seen in the medial gastrocnemius, biceps femoris short head, and tibialis anterior muscles.   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 Overall, these findings are severe in degree electrically; however, there is mild interval improvement in the lower extremities when compared to her electrodiagnostic testing dated 12/11/2015.   _____________________________ Narda Amber, D.O.    Nerve Conduction Studies Anti Sensory Summary Table   Stim Site NR Peak (ms) Norm Peak (ms) P-T Amp (V) Norm P-T Amp  Left Median Anti Sensory (2nd Digit)  Wrist    3.9 <3.3 15.6 >20  Left Radial Anti Sensory (Base 1st Digit)  Wrist    2.3 <2.7 19.3 >18  Left Sup Peroneal Anti Sensory (Ant Lat Mall)  12 cm NR  <4.4  >6  Left Sural Anti Sensory (Lat Mall)  Calf NR  <4.4  >6  Left Ulnar Anti Sensory (5th Digit)  Wrist    3.8 <3.0 10.6 >18   Motor Summary Table   Stim Site NR Onset (ms) Norm Onset (ms) O-P Amp (mV) Norm O-P Amp Site1 Site2 Delta-0 (ms) Dist (cm) Vel (m/s) Norm Vel (m/s)  Left Median Motor (Abd Poll Brev)  Wrist    5.0 <3.9 2.2 >6 Elbow Wrist 5.8 30.0 52 >51  Elbow    10.8  2.1         Left Peroneal Motor (Ext Dig Brev)  Ankle    7.2 <5.5 0.7 >  3 B Fib Ankle 9.8 33.0 34 >41  B Fib    17.0  0.8  Poplt B Fib 2.7 10.0 37 >41  Poplt    19.7  0.8         Left Peroneal TA Motor (Tib Ant)  Fib Head    2.6 <4.0 4.2 >4 Poplit Fib Head 1.8 10.0 56 >41  Poplit    4.4  3.7         Left Tibial Motor (Abd Hall Brev)  Ankle    4.6 <5.8 5.1 >8 Knee  Ankle 11.3 39.0 35 >41  Knee    15.9  2.5         Left Ulnar Motor (Abd Dig Minimi)  Wrist    2.5 <3.0 6.9 >8 B Elbow Wrist 4.8 24.0 50 >51  B Elbow    7.3  6.7  A Elbow B Elbow 3.1 10.0 32 >51  A Elbow    10.4  6.5          F Wave Studies   NR F-Lat (ms) Lat Norm (ms) L-R F-Lat (ms)  Right Ulnar (Mrkrs) (Abd Dig Min)     34.96 <33    H Reflex Studies   NR H-Lat (ms) Lat Norm (ms) L-R H-Lat (ms)  Left Tibial (Gastroc)  NR  <35    EMG   Side Muscle Ins Act Fibs Psw Fasc Number Recrt Dur Dur. Amp Amp. Poly Poly. Comment  Left AntTibialis Nml Nml 1+ Nml 2- Rapid Few 1+ Few 1+ Nml Nml N/A  Left 1stDorInt Nml Nml Nml Nml Nml Nml Nml Nml Nml Nml Nml Nml N/A  Left Abd Poll Brev Nml Nml Nml Nml 1- Mod Nml Nml Nml Nml Nml Nml N/A  Left Ext Indicis Nml Nml Nml Nml Nml Nml Nml Nml Nml Nml Nml Nml N/A  Left PronatorTeres Nml Nml Nml Nml Nml Nml Nml Nml Nml Nml Nml Nml N/A  Left Biceps Nml Nml Nml Nml Nml Nml Nml Nml Nml Nml Nml Nml N/A  Left Triceps Nml Nml Nml Nml Nml Nml Nml Nml Nml Nml Nml Nml N/A  Left Deltoid Nml Nml Nml Nml Nml Nml Nml Nml Nml Nml Nml Nml N/A  Left Cervical Parasp Low Nml Nml Nml Nml Nml Nml Nml Nml Nml Nml Nml Nml N/A  Left Gastroc Nml 1+ Nml Nml None None - - - - - - N/A  Left RectFemoris Nml Nml Nml Nml None None - - - - - - N/A  Left BicepsFemS Nml 1+ Nml Nml None - - - - - - - N/A  Left GluteusMed Nml Nml Nml Nml Nml Nml Nml Nml Nml Nml Nml Nml N/A  Left Lumbo Parasp Low Nml Nml Nml Nml Nml Nml Nml Nml Nml Nml Nml Nml N/A      Waveforms:

## 2016-01-21 ENCOUNTER — Encounter (HOSPITAL_COMMUNITY): Payer: Self-pay | Admitting: Psychiatry

## 2016-01-21 ENCOUNTER — Ambulatory Visit (HOSPITAL_COMMUNITY): Payer: Self-pay | Admitting: Psychiatry

## 2016-01-23 ENCOUNTER — Inpatient Hospital Stay (HOSPITAL_COMMUNITY): Payer: Medicaid Other

## 2016-01-23 ENCOUNTER — Inpatient Hospital Stay (HOSPITAL_COMMUNITY)
Admission: EM | Admit: 2016-01-23 | Discharge: 2016-01-26 | DRG: 074 | Disposition: A | Discharge status: Home Health | Payer: Medicaid Other | Attending: Internal Medicine | Admitting: Internal Medicine

## 2016-01-23 ENCOUNTER — Telehealth: Payer: Self-pay | Admitting: *Deleted

## 2016-01-23 ENCOUNTER — Ambulatory Visit: Payer: Self-pay | Admitting: Neurology

## 2016-01-23 ENCOUNTER — Encounter (HOSPITAL_COMMUNITY): Payer: Self-pay | Admitting: Family Medicine

## 2016-01-23 DIAGNOSIS — R262 Difficulty in walking, not elsewhere classified: Secondary | ICD-10-CM | POA: Diagnosis present

## 2016-01-23 DIAGNOSIS — F900 Attention-deficit hyperactivity disorder, predominantly inattentive type: Secondary | ICD-10-CM | POA: Diagnosis not present

## 2016-01-23 DIAGNOSIS — Z888 Allergy status to other drugs, medicaments and biological substances status: Secondary | ICD-10-CM

## 2016-01-23 DIAGNOSIS — D509 Iron deficiency anemia, unspecified: Secondary | ICD-10-CM | POA: Diagnosis present

## 2016-01-23 DIAGNOSIS — R202 Paresthesia of skin: Secondary | ICD-10-CM | POA: Diagnosis present

## 2016-01-23 DIAGNOSIS — R29898 Other symptoms and signs involving the musculoskeletal system: Secondary | ICD-10-CM | POA: Diagnosis not present

## 2016-01-23 DIAGNOSIS — Z91048 Other nonmedicinal substance allergy status: Secondary | ICD-10-CM | POA: Diagnosis not present

## 2016-01-23 DIAGNOSIS — F419 Anxiety disorder, unspecified: Secondary | ICD-10-CM | POA: Diagnosis present

## 2016-01-23 DIAGNOSIS — R200 Anesthesia of skin: Secondary | ICD-10-CM | POA: Diagnosis not present

## 2016-01-23 DIAGNOSIS — E440 Moderate protein-calorie malnutrition: Secondary | ICD-10-CM | POA: Insufficient documentation

## 2016-01-23 DIAGNOSIS — R740 Nonspecific elevation of levels of transaminase and lactic acid dehydrogenase [LDH]: Secondary | ICD-10-CM | POA: Diagnosis present

## 2016-01-23 DIAGNOSIS — G629 Polyneuropathy, unspecified: Secondary | ICD-10-CM | POA: Diagnosis not present

## 2016-01-23 DIAGNOSIS — Z87891 Personal history of nicotine dependence: Secondary | ICD-10-CM | POA: Diagnosis not present

## 2016-01-23 DIAGNOSIS — F988 Other specified behavioral and emotional disorders with onset usually occurring in childhood and adolescence: Secondary | ICD-10-CM | POA: Diagnosis present

## 2016-01-23 DIAGNOSIS — Z87898 Personal history of other specified conditions: Secondary | ICD-10-CM

## 2016-01-23 DIAGNOSIS — G6181 Chronic inflammatory demyelinating polyneuritis: Principal | ICD-10-CM | POA: Diagnosis present

## 2016-01-23 DIAGNOSIS — Z9884 Bariatric surgery status: Secondary | ICD-10-CM

## 2016-01-23 DIAGNOSIS — Z7289 Other problems related to lifestyle: Secondary | ICD-10-CM | POA: Diagnosis not present

## 2016-01-23 DIAGNOSIS — G43909 Migraine, unspecified, not intractable, without status migrainosus: Secondary | ICD-10-CM | POA: Diagnosis present

## 2016-01-23 DIAGNOSIS — G894 Chronic pain syndrome: Secondary | ICD-10-CM | POA: Diagnosis present

## 2016-01-23 DIAGNOSIS — M797 Fibromyalgia: Secondary | ICD-10-CM | POA: Diagnosis present

## 2016-01-23 DIAGNOSIS — E538 Deficiency of other specified B group vitamins: Secondary | ICD-10-CM | POA: Diagnosis present

## 2016-01-23 DIAGNOSIS — F329 Major depressive disorder, single episode, unspecified: Secondary | ICD-10-CM | POA: Diagnosis present

## 2016-01-23 DIAGNOSIS — F418 Other specified anxiety disorders: Secondary | ICD-10-CM | POA: Diagnosis not present

## 2016-01-23 DIAGNOSIS — G43719 Chronic migraine without aura, intractable, without status migrainosus: Secondary | ICD-10-CM | POA: Diagnosis not present

## 2016-01-23 DIAGNOSIS — Z818 Family history of other mental and behavioral disorders: Secondary | ICD-10-CM | POA: Diagnosis not present

## 2016-01-23 DIAGNOSIS — G43709 Chronic migraine without aura, not intractable, without status migrainosus: Secondary | ICD-10-CM | POA: Diagnosis present

## 2016-01-23 DIAGNOSIS — G609 Hereditary and idiopathic neuropathy, unspecified: Secondary | ICD-10-CM | POA: Diagnosis present

## 2016-01-23 DIAGNOSIS — R52 Pain, unspecified: Secondary | ICD-10-CM

## 2016-01-23 DIAGNOSIS — N3010 Interstitial cystitis (chronic) without hematuria: Secondary | ICD-10-CM | POA: Diagnosis present

## 2016-01-23 DIAGNOSIS — R7401 Elevation of levels of liver transaminase levels: Secondary | ICD-10-CM | POA: Diagnosis present

## 2016-01-23 DIAGNOSIS — Z91040 Latex allergy status: Secondary | ICD-10-CM

## 2016-01-23 DIAGNOSIS — F332 Major depressive disorder, recurrent severe without psychotic features: Secondary | ICD-10-CM | POA: Diagnosis present

## 2016-01-23 DIAGNOSIS — R531 Weakness: Secondary | ICD-10-CM | POA: Insufficient documentation

## 2016-01-23 LAB — COMPREHENSIVE METABOLIC PANEL
ALK PHOS: 112 U/L (ref 38–126)
ALT: 93 U/L — AB (ref 14–54)
AST: 68 U/L — AB (ref 15–41)
Albumin: 4.4 g/dL (ref 3.5–5.0)
Anion gap: 13 (ref 5–15)
BUN: 11 mg/dL (ref 6–20)
CHLORIDE: 107 mmol/L (ref 101–111)
CO2: 20 mmol/L — AB (ref 22–32)
CREATININE: 0.6 mg/dL (ref 0.44–1.00)
Calcium: 9.6 mg/dL (ref 8.9–10.3)
GFR calc Af Amer: >60 mL/min (ref >60)
Glucose, Bld: 91 mg/dL (ref 65–99)
Potassium: 3.9 mmol/L (ref 3.5–5.1)
SODIUM: 140 mmol/L (ref 135–145)
Total Bilirubin: 0.3 mg/dL (ref 0.3–1.2)
Total Protein: 7.4 g/dL (ref 6.5–8.1)

## 2016-01-23 LAB — CBC
HEMATOCRIT: 32.8 % — AB (ref 36.0–46.0)
Hemoglobin: 10 g/dL — ABNORMAL LOW (ref 12.0–15.0)
MCH: 25.2 pg — ABNORMAL LOW (ref 26.0–34.0)
MCHC: 30.5 g/dL (ref 30.0–36.0)
MCV: 82.6 fL (ref 78.0–100.0)
Platelets: 349 10*3/uL (ref 150–400)
RBC: 3.97 MIL/uL (ref 3.87–5.11)
RDW: 14.4 % (ref 11.5–15.5)
WBC: 5.6 10*3/uL (ref 4.0–10.5)

## 2016-01-23 LAB — DIFFERENTIAL
BASOS ABS: 0 10*3/uL (ref 0.0–0.1)
BASOS PCT: 0 %
Eosinophils Absolute: 0.1 10*3/uL (ref 0.0–0.7)
Eosinophils Relative: 1 %
LYMPHS PCT: 41 %
Lymphs Abs: 2.3 10*3/uL (ref 0.7–4.0)
MONOS PCT: 11 %
Monocytes Absolute: 0.6 10*3/uL (ref 0.1–1.0)
NEUTROS ABS: 2.6 10*3/uL (ref 1.7–7.7)
Neutrophils Relative %: 47 %

## 2016-01-23 MED ORDER — HYDROMORPHONE HCL 1 MG/ML IJ SOLN
1.0000 mg | Freq: Once | INTRAMUSCULAR | Status: AC
  Administered 2016-01-24: 1 mg via INTRAVENOUS
  Filled 2016-01-23: qty 1

## 2016-01-23 MED ORDER — KETOROLAC TROMETHAMINE 15 MG/ML IJ SOLN
15.0000 mg | Freq: Once | INTRAMUSCULAR | Status: DC
  Filled 2016-01-23: qty 1

## 2016-01-23 MED ORDER — PROMETHAZINE HCL 25 MG/ML IJ SOLN
12.5000 mg | Freq: Once | INTRAMUSCULAR | Status: AC
  Administered 2016-01-23: 12.5 mg via INTRAVENOUS
  Filled 2016-01-23: qty 1

## 2016-01-23 MED ORDER — DIAZEPAM 5 MG/ML IJ SOLN
5.0000 mg | Freq: Once | INTRAMUSCULAR | Status: AC
  Administered 2016-01-23: 5 mg via INTRAVENOUS
  Filled 2016-01-23: qty 2

## 2016-01-23 MED ORDER — HYDROMORPHONE HCL 1 MG/ML IJ SOLN
1.0000 mg | Freq: Once | INTRAMUSCULAR | Status: AC
  Administered 2016-01-23: 1 mg via INTRAVENOUS
  Filled 2016-01-23: qty 1

## 2016-01-23 NOTE — Telephone Encounter (Signed)
Patient called and said that she is unable to move from her lower back down to her legs.  Her 30 year old had to help her out of bed to the recliner which she said was extremely painful.  She is currently in her reclined in her chair because she is unable to sit up.  Please advise.

## 2016-01-23 NOTE — Telephone Encounter (Signed)
Noted  

## 2016-01-23 NOTE — ED Provider Notes (Signed)
CSN: [id number]     Arrival date & time 01/23/16  1720 History   First MD Initiated Contact with Patient 01/23/16 1804     Chief Complaint  Patient presents with  . Extremity Weakness     (Consider location/radiation/quality/duration/timing/severity/associated sxs/prior Treatment) HPI   30yF with b/l leg weakness (L>R), LUE weakness, painful paresthesias and urinary incontinence. Symptoms have waxed/waned. Has been followed by neurology for this. She was referred to ED with recommendation for IVIG. Recent neurology note included below. She has no respiratory complaints. No rash. No acute visual complaints. No fever.   Returned call to patient. She reports waking up with weakness of the legs and arms and was previously doing much better. She is dragging her legs when walking. Her pain has exacerbated and now involves all her extremities and she has urinary incontinence. I recommend that she call EMS to transfer her to the emergency department.  In brief, Ms. Divita is a 30 year-old female who I saw for subacute onset of bilateral leg weakness, painful paresthesias, and falls since October 2016. She had underwent extensive testing including NCS/EMG which showed a subacute severe polyneuropathy of the legs. Because of her NCS findings, extensive CSF and lab testing was ordered and has returned largely nondiagnostic. She was noted to have folate deficiency and started supplementation. She was last seen in the clinic on 1/27 at which time her weakness and pain was doing better. 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 Overall, these findings are severe in degree electrically; however, there is mild interval improvement in the lower extremities when compared to her electrodiagnostic testing dated  12/11/2015.  Although there is no serology testing that clearly points to an inflammatory condition, the fact that she has active denervation on her EMG and severe neuropathy, pushes me to treat more aggressively. 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 I would favor treating her with IVIG 2g/kg over 5 days. She has been instructed to go to the emergency department where she can be assessed. Because there has always been nonorganic findings on her exam, accurate assessment of her motor strength has been challenging.   Donika K. Posey Pronto, DO   Past Medical History  Diagnosis Date  . Migraines   . Interstitial cystitis   . IUD FEB 2010  . Gastritis JULY 2011  . Depression   . 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 Fatty liver   . Obesity (BMI 30-39.9) 2011 228 LBS BMI 36.8  . Iron deficiency anemia 07/23/2010  . Irritable bowel syndrome 2012 DIARRHEA    JUN 2012 TTG IgA 14.9  . Elevated liver enzymes JUL 2011ALK PHOS 111-127 AST  143-267 ALT  213-321T BILI 0.6  ALB  3.7-4.06 Jun 2011 ALK PHOS 118 AST 24 ALT 42 T BILI 0.4 ALB 3.9  . Chronic daily headache   . Ovarian cyst   . ADD (attention deficit disorder)   . RLQ abdominal pain 07/18/2013  . Menorrhagia 07/18/2013  . Anxiety   . Potassium (K) deficiency   . Dysmenorrhea 09/18/2013  . Stress 09/18/2013  . Patient desires pregnancy 09/18/2013  . Pregnant 12/25/2013  . Blood transfusion without reported diagnosis   . Heart murmur   . Gastric bypass status for obesity   .  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 Dysrhythmia   . Sciatica of left side 11/14/2014  . Fibromyalgia   . 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 PONV (postoperative nausea and vomiting) seizure post-operatively  . Psychiatric pseudoseizure   . Depression   . Lupus [facility])    Past Surgical History  Procedure Laterality  Date  . Gab  2007    in [place]-POUCH 5 CM  . Cholecystectomy  2005    biliary dyskinesia  . Cath self every nite      for sodium bicarb injection (discontinued 2013)  . Colonoscopy  JUN 2012 ABD PN/DIARRHEA WITH PROPOFOL    NL COLON  . Upper gastrointestinal endoscopy  JULY 2011 NAUSEA-D125,V6, PH 25    Bx; GASTRITIS, POUCH-5 CM LONG  . Dilation and curettage of uterus    . Tonsillectomy    . Gastric bypass  06/2006  . [place] dilation  06/20/2012    Dr. Barnie Alderman gastritis/Ulcer in the mid jejunum. Empiric dilation.   . Tonsillectomy and adenoidectomy    . Esophagogastroduodenoscopy    . Wisdom tooth extraction    . Repair vaginal cuff N/A 07/30/2014    Procedure: REPAIR VAGINAL CUFF;  Surgeon: Mora Bellman, MD;  Location: [facility];  Service: Gynecology;  Laterality: N/A;  . Hysteroscopy w/d&c N/A 09/12/2014    Procedure: DILATATION AND CURETTAGE /HYSTEROSCOPY;  Surgeon: Jonnie Kind, MD;  Location: [facility];  Service: Gynecology;  Laterality: N/A;  . Esophagogastroduodenoscopy (egd) with propofol N/A 04/10/2015    Procedure: ESOPHAGOGASTRODUODENOSCOPY (EGD) WITH PROPOFOL;  Surgeon: Daneil Dolin, MD;  Location: [facility];  Service: Endoscopy;  Laterality: N/A;  . Esophageal dilation N/A 04/10/2015    Procedure: ESOPHAGEAL DILATION WITH 54FR MALONEY DILATOR;  Surgeon: Daneil Dolin, MD;  Location: [facility];  Service: Endoscopy;  Laterality: N/A;  . Biopsy  04/10/2015    Procedure: BIOPSY;  Surgeon: Daneil Dolin, MD;  Location: [facility];  Service: Endoscopy;;   Family History  Problem Relation Age of Onset  . Hemochromatosis Maternal Grandmother   . Migraines Maternal Grandmother   . Cancer Maternal Grandmother   . Breast cancer Maternal Grandmother   . Hypertension Father   . Diabetes Father   . Coronary artery disease Father   . Migraines Paternal Grandmother   . Breast cancer Paternal Grandmother   . Cancer Mother     breast  . Hemochromatosis Mother   . Breast cancer Mother    . Depression Mother   . Anxiety disorder Mother   . Coronary artery disease Paternal Grandfather   . Anxiety disorder Brother   . Bipolar disorder Brother   . Healthy Son   . Healthy Daughter    Social History  Substance Use Topics  . Smoking status: Former Smoker -- 0.25 packs/day for 6 years    Types: Cigarettes    Quit date: 11/29/2015  . Smokeless tobacco: Never Used  . Alcohol Use: 0.0 oz/week    0 Standard drinks or equivalent per week     Comment: once every weeks - maybe a beer with a dinner, rarely   OB History    Gravida Para Term Preterm AB TAB SAB Ectopic Multiple Living   '3 2 1 1 1  1   2     '$ Review of Systems  All systems reviewed and negative, other than as noted in HPI.   Allergies  Gabapentin; Metoclopramide hcl; Morphine and related; Tramadol; Zofran; Latex; and Tape  Home Medications   Prior to Admission medications   Medication Sig Start Date End Date Taking? Authorizing Provider  amphetamine-dextroamphetamine (ADDERALL XR) 30 MG 24 hr capsule Take 1 capsule (30 mg total) by mouth daily. 10/15/15   Cloria Spring, MD  APAP-Pamabrom-Pyrilamine (MENSTRUAL PAIN RELIEF) 500-25-15 MG TABS Take 1-2 tablets by mouth daily as needed (for pain).    Historical Provider, MD  carbamazepine (TEGRETOL) 200 MG tablet Take two tablets at bedtime Patient taking differently: Take 400 mg by mouth at bedtime. Take two tablets at bedtime 10/15/15   Cloria Spring, MD  clonazePAM (KLONOPIN) 0.5 MG tablet Take 1 tablet (0.5 mg total) by mouth 3 (three) times daily as needed for anxiety. 01/09/16 01/08/17  Cloria Spring, MD  DULoxetine (CYMBALTA) 60 MG capsule Take 1 capsule (60 mg total) by mouth daily. 10/15/15   Cloria Spring, MD  HYDROcodone-acetaminophen (NORCO/VICODIN) 5-325 MG tablet Take 1 tablet by mouth every 6 (six) hours as needed. 12/31/15   Kathyrn Drown, MD  ibuprofen (ADVIL,MOTRIN) 800 MG tablet Take 1 tablet (800 mg total) by mouth every 8 (eight) hours as needed  for mild pain. 10/20/15   Kristen N Ward, DO  lidocaine (LIDODERM) 5 % Place 1 patch onto the skin daily. Remove & Discard patch within 12 hours or as directed by MD 11/27/15   Charlynne Cousins, MD  tiZANidine (ZANAFLEX) 4 MG tablet Take 1 tablet (4 mg total) by mouth 3 (three) times daily as needed for muscle spasms. 01/09/16   Mikey Kirschner, MD  topiramate (TOPAMAX) 50 MG tablet Take 50-100 mg by mouth 2 (two) times daily. Take '50mg'$  in the am and '100mg'$  qhs    Historical Provider, MD  traZODone (DESYREL) 150 MG tablet Take 1 tablet (150 mg total) by mouth at bedtime as needed for sleep. 10/15/15   Cloria Spring, MD   BP 110/90 mmHg  Pulse 114  Temp(Src) 98.1 F (36.7 C) (Oral)  Resp 16  SpO2 98%  LMP 12/21/2015 Physical Exam  Constitutional: She appears well-developed and well-nourished. No distress.  HENT:  Head: Normocephalic and atraumatic.  Eyes: Conjunctivae are normal. Right eye exhibits no discharge. Left eye exhibits no discharge.  Neck: Neck supple.  Cardiovascular: Normal rate, regular rhythm and normal heart sounds.  Exam reveals no gallop and no friction rub.   No murmur heard. Pulmonary/Chest: Effort normal and breath sounds normal. No respiratory distress.  Abdominal: Soft. She exhibits no distension. There is no tenderness.  Musculoskeletal: She exhibits no edema or tenderness.  Speech clear. Content appropriate. CN 2-12 intact. Strength 3/5 LUE, 5/5 RUE. Strength 2/5 LLE, 3/5 RLE. Reports decreased sensation to light touch b/l LE  Neurological: She is alert.  Skin: Skin is warm and dry.  Psychiatric: She has a normal mood and affect. Her behavior is normal. Thought content normal.  Nursing note and vitals reviewed.   ED Course  Procedures (including critical care time) Labs Review Labs Reviewed  CBC - Abnormal; Notable for the following:    Hemoglobin 10.0 (*)    HCT 32.8 (*)    MCH 25.2 (*)    All other components within normal limits  COMPREHENSIVE  METABOLIC PANEL - Abnormal; Notable for the following:    CO2 20 (*)    AST 68 (*)    ALT 93 (*)    All other components within normal limits  FERRITIN - Abnormal; Notable for the following:    Ferritin 8 (*)  All other components within normal limits  FOLATE RBC - Abnormal; Notable for the following:    Hematocrit 29.5 (*)    All other components within normal limits  IRON AND TIBC - Abnormal; Notable for the following:    Iron 26 (*)    TIBC 480 (*)    Saturation Ratios 5 (*)    All other components within normal limits  CBC - Abnormal; Notable for the following:    RBC 3.53 (*)    Hemoglobin 8.9 (*)    HCT 29.1 (*)    MCH 25.2 (*)    All other components within normal limits  COMPREHENSIVE METABOLIC PANEL - Abnormal; Notable for the following:    Total Protein 6.1 (*)    AST 48 (*)    ALT 75 (*)    All other components within normal limits  DIFFERENTIAL  IGA  RETICULOCYTES  VITAMIN B12  MAGNESIUM    Imaging Review No results found. I have personally reviewed and evaluated these images and lab results as part of my medical decision-making.   EKG Interpretation None      MDM   Final diagnoses:  Weakness of left lower extremity  Paresthesia    30 year old female with extremity weakness. Waxing/waning symptoms. I get the impression that there is some non-physiologic component, but she does have documented disease as well. Evaluated by neurology. Appreciate their input. Admission for further workup and management.    Virgel Manifold, MD 01/27/16 1430

## 2016-01-23 NOTE — Telephone Encounter (Signed)
Returned call to patient.   She reports waking up with weakness of the legs and arms and was previously doing much better.  She is dragging her legs when walking.  Her pain has exacerbated and now involves all her extremities and she has urinary incontinence.  I recommend that she call EMS to transfer her to the emergency department.  In brief, [name] is a 30 year-old female who I saw for subacute onset of bilateral leg weakness, painful paresthesias, and falls since October 2016. She had underwent extensive testing including NCS/EMG which showed a subacute severe polyneuropathy of the legs.  Because of her NCS findings, extensive CSF and lab testing was ordered and has returned largely nondiagnostic. She was noted to have folate deficiency and started supplementation. She was last seen in the clinic on 1/27 at which time her weakness and pain was doing better.  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 Overall, these findings are severe in degree electrically; however, there is mild interval improvement in the lower extremities when compared to her electrodiagnostic testing dated 12/11/2015.  Although there is no serology testing that clearly points to an inflammatory condition, the fact that she has active denervation on her EMG and severe neuropathy, pushes me to treat more aggressively.  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  I would favor treating her with IVIG 2g/kg over 5 days.  She has been instructed to go to the emergency department where she can be assessed.  Because there has always been nonorganic findings on her exam, accurate assessment of her motor strength has been challenging.    Penny K.  Posey Pronto, DO

## 2016-01-23 NOTE — H&P (Signed)
History and Physical  Patient Name: Penny [name]     EPP:[id number]    DOB: 01-20-1986    DOA: 01/23/2016 Referring physician: Virgel Manifold, MD PCP: [name], MD      Chief Complaint: Leg weakness and back pain  HPI: Penny [name] is a 30 y.o. female with a past medical history significant for chronic migraines, interstitial cystitis, depression, anemia, unexplained transaminitis, and hx of Roux-Y gastric bypass, and now progressive distal neuropathy who presents with back pain and leg weakness.  For thorough background, please see the excellent note by Narda Amber, MD on Jan. 27, 2017.  Briefly, the patient has had roughly 18 months of low back pain, left leg pain, left leg weakness, and tinglings in the distal legs and arms, all which first manifested around the time of the birth of her last child. She was evaluated by Dr. [name] at [facility], cervical MRI appeared normal, and the symptoms seem to have ameliorated somewhat.  In October of last year, the symptoms seem to have returned, eventually prompting referral to [place] again, at which time Dr. Posey Pronto cataloged the following primary complaints: Low back pain, severe and sharp, worsened with movement, left leg paresthesias, paresthesias of the face and arms that are painful, urge incontinence without bowel incontinence, and bilateral leg weakness left greater than right.  Her workup included negative ANCA, negative SPEP, negative RPR, negative ESR and CRP, weakly positive ANA, normal TSH, and normal CSF cell count/differential, fungal cultures, IgG, oligoclonal bands, cytology, CSF Lyme PCR.  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    Today, the patient had worsening of her lower back pain and it was accompanied now by loss of feeling completely in her legs as well as inability to get up from a chair.  The patient called her primary neurologist  today, who recommended evaluation in the ER and preference for IVIG if no new clear medical cause of her weakness.  In the ED, the patient was afebrile, tachycardic, with BP 110/90.  K3.9, HCO3 20, CR 0.6, AST/ALT 68/93, WBC 5.6, and Hgb 10.  Neurology were consulted who recommended complete neuraxis imaging with contrast followed by admission to Medicine and IVIG if imaging normal.  She complained of severe low back pain, radiating into the front of the left thigh, deep in the hips, and down the sides of both legs, throbbing in character and unrelieved with Cymbalta, hydrocodone, or Dilaudid in the ER.     Review of Systems:  Pt complains of back pain, leg pain, sudden urge incontinence, numbness. Pt denies any fever, chills, cough, sputum, chest pain, dyspnea, confusion, syncope.  All other systems negative except as just noted or noted in the history of present illness.  Allergies  Allergen Reactions  . Gabapentin Other (See Comments)    Pt states that she was unresponsive after taking this medication, but her vitals remained stable.    . Metoclopramide Hcl Anxiety and Other (See Comments)    Pt states that she felt like she was trapped in a box, and could not get out.  Pt also states that she had temporary loss of movement, weakness, and tingling.    . Morphine And Related Other (See Comments)    Chest pain   . Tramadol Other (See Comments)    Seizures  . Zofran [Ondansetron] Other (See Comments)    Migraines   . Latex Rash  . Tape Itching  and Rash    Please use paper tape    Prior to Admission medications   Medication Sig Start Date End Date Taking? Authorizing Provider  amphetamine-dextroamphetamine (ADDERALL XR) 30 MG 24 hr capsule Take 1 capsule (30 mg total) by mouth daily. 10/15/15  Yes Cloria Spring, MD  APAP-Pamabrom-Pyrilamine (MENSTRUAL PAIN RELIEF) 500-25-15 MG TABS Take 1-2 tablets by mouth daily as needed (for pain).   Yes Historical Provider, MD  carbamazepine  (TEGRETOL) 200 MG tablet Take two tablets at bedtime Patient taking differently: Take 400 mg by mouth at bedtime. Take two tablets at bedtime 10/15/15  Yes Cloria Spring, MD  clonazePAM (KLONOPIN) 0.5 MG tablet Take 1 tablet (0.5 mg total) by mouth 3 (three) times daily as needed for anxiety. 01/09/16 01/08/17 Yes Cloria Spring, MD  DULoxetine (CYMBALTA) 60 MG capsule Take 1 capsule (60 mg total) by mouth daily. 10/15/15  Yes Cloria Spring, MD  HYDROcodone-acetaminophen (NORCO/VICODIN) 5-325 MG tablet Take 1 tablet by mouth every 6 (six) hours as needed. 12/31/15  Yes Kathyrn Drown, MD  tiZANidine (ZANAFLEX) 4 MG tablet Take 1 tablet (4 mg total) by mouth 3 (three) times daily as needed for muscle spasms. 01/09/16  Yes Mikey Kirschner, MD  topiramate (TOPAMAX) 50 MG tablet Take 50-100 mg by mouth 2 (two) times daily. Take '50mg'$  in the am and '100mg'$  qhs   Yes Historical Provider, MD  traZODone (DESYREL) 150 MG tablet Take 1 tablet (150 mg total) by mouth at bedtime as needed for sleep. 10/15/15  Yes Cloria Spring, MD  ibuprofen (ADVIL,MOTRIN) 800 MG tablet Take 1 tablet (800 mg total) by mouth every 8 (eight) hours as needed for mild pain. 10/20/15   Kristen N Ward, DO  lidocaine (LIDODERM) 5 % Place 1 patch onto the skin daily. Remove & Discard patch within 12 hours or as directed by MD 11/27/15   Charlynne Cousins, MD    Past Medical History  Diagnosis Date  . Migraines   . Interstitial cystitis   . IUD FEB 2010  . Gastritis JULY 2011  . Depression   . 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 Fatty liver   . Obesity (BMI 30-39.9) 2011 228 LBS BMI 36.8  . Iron deficiency anemia 07/23/2010  . Irritable bowel syndrome 2012 DIARRHEA    JUN 2012 TTG IgA 14.9  . Elevated liver enzymes JUL 2011ALK PHOS 111-127 AST  143-267 ALT  213-321T BILI 0.6  ALB  3.7-4.06 Jun 2011 ALK PHOS 118 AST 24 ALT 42 T BILI 0.4 ALB 3.9  . Chronic daily headache   . Ovarian cyst   . ADD (attention deficit disorder)     . RLQ abdominal pain 07/18/2013  . Menorrhagia 07/18/2013  . Anxiety   . Potassium (K) deficiency   . Dysmenorrhea 09/18/2013  . Stress 09/18/2013  . Patient desires pregnancy 09/18/2013  . Pregnant 12/25/2013  . Blood transfusion without reported diagnosis   . Heart murmur   . Gastric bypass status for obesity   . 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 Dysrhythmia   . Sciatica of left side 11/14/2014  . Fibromyalgia   . 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 PONV (postoperative nausea and vomiting) seizure post-operatively  . Psychiatric pseudoseizure   . Depression   . Lupus [facility])     Past Surgical History  Procedure Laterality Date  . Gab  2007  in [place]-POUCH 5 CM  . Cholecystectomy  2005    biliary dyskinesia  . Cath self every nite      for sodium bicarb injection (discontinued 2013)  . Colonoscopy  JUN 2012 ABD PN/DIARRHEA WITH PROPOFOL    NL COLON  . Upper gastrointestinal endoscopy  JULY 2011 NAUSEA-D125,V6, PH 25    Bx; GASTRITIS, POUCH-5 CM LONG  . Dilation and curettage of uterus    . Tonsillectomy    . Gastric bypass  06/2006  . Savory dilation  06/20/2012    Dr. Barnie Alderman gastritis/Ulcer in the mid jejunum. Empiric dilation.   . Tonsillectomy and adenoidectomy    . Esophagogastroduodenoscopy    . Wisdom tooth extraction    . Repair vaginal cuff N/A 07/30/2014    Procedure: REPAIR VAGINAL CUFF;  Surgeon: Mora Bellman, MD;  Location: [facility];  Service: Gynecology;  Laterality: N/A;  . Hysteroscopy w/d&c N/A 09/12/2014    Procedure: DILATATION AND CURETTAGE /HYSTEROSCOPY;  Surgeon: Jonnie Kind, MD;  Location: [facility];  Service: Gynecology;  Laterality: N/A;  . Esophagogastroduodenoscopy (egd) with propofol N/A 04/10/2015    Procedure: ESOPHAGOGASTRODUODENOSCOPY (EGD) WITH PROPOFOL;  Surgeon: Daneil Dolin, MD;  Location: [facility];  Service: Endoscopy;  Laterality: N/A;  . Esophageal dilation N/A 04/10/2015    Procedure:  ESOPHAGEAL DILATION WITH 54FR MALONEY DILATOR;  Surgeon: Daneil Dolin, MD;  Location: [facility];  Service: Endoscopy;  Laterality: N/A;  . Biopsy  04/10/2015    Procedure: BIOPSY;  Surgeon: Daneil Dolin, MD;  Location: [facility];  Service: Endoscopy;;    Family history: family history includes Anxiety disorder in her brother and mother; Bipolar disorder in her brother; Breast cancer in her maternal grandmother, mother, and paternal grandmother; Cancer in her maternal grandmother and mother; Coronary artery disease in her father and paternal grandfather; Depression in her mother; Diabetes in her father; Healthy in her daughter and son; Hemochromatosis in her maternal grandmother and mother; Hypertension in her father; Migraines in her maternal grandmother and paternal grandmother.  Social History: Patient lives by herself, she reports. She formerly worked at [employer]. She has minimal smoking history and does not smoke currently. She uses prescribed opiates and benzodiazepines and amphetamines.       Physical Exam: BP 109/74 mmHg  Pulse 66  Temp(Src) 98.1 F (36.7 C) (Oral)  Resp 16  SpO2 100%  LMP 12/21/2015 General appearance: Adult female, alert and in moderate distress from pain with movement, otherwise normal.   Eyes: Anicteric, conjunctiva pink, lids and lashes normal.     ENT: No nasal deformity, discharge, or epistaxis.  OP moist without lesions.   Skin: Warm and dry.  No suspicious rashes or lesions on face, neck, chest, abdomen, arms or legs. Cardiac: RRR, nl S1-S2, no murmurs appreciated.  Capillary refill is brisk.   Respiratory: Normal respiratory rate and rhythm.  CTAB without rales or wheezes. Abdomen: Abdomen soft without rigidity.  No TTP.  MSK: No deformities or effusions in hands, wrists, elbows, knees.  There appears to be muscle wasting, loss of thenar eminence in hands, with atrophy of intrinsic muscles of hands as well. Neuro: Face symmetric.  Speech normal.  EOMI  and pupils normal.  Sensorium intact and responding to questions, attention normal.  Strength 5/5 in UE bilaterally, grip and thumb opposition.  Lower extremities, patient is able to shift the right leg against gravity, but no on the left.  Decreased sensation on left leg and thigh. Psych: Affect  blunted.  No evidence of aural or visual hallucinations or delusions.       Labs on Admission:  The metabolic panel shows normal sodium, potassium, renal function. Glucose is normal. HC 3 is mildly low. AST and ALT are sudden above the upper limit of normal, and have been periodically for years. Folate level. The complete blood count shows chronic normocytic anemia with increased RDW, no leukocytosis or thrombocytopenia.   Radiological Exams on Admission: Mr Kizzie Fantasia Contrast  01/24/2016  CLINICAL DATA:  Progressive lower extremity numbness, severe neuropathy, subsequent worsening. Possible autoimmune disease. EXAM: MRI HEAD WITHOUT AND WITH CONTRAST TECHNIQUE: Multiplanar, multiecho pulse sequences of the brain and surrounding structures were obtained without and with intravenous contrast. CONTRAST:  29m MULTIHANCE GADOBENATE DIMEGLUMINE 529 MG/ML IV SOLN COMPARISON:  MRI of the head November 27, 2015 FINDINGS: The ventricles and sulci are normal for patient's age. No abnormal parenchymal signal, mass lesions, mass effect. No abnormal parenchymal enhancement. No reduced diffusion to suggest acute ischemia. No susceptibility artifact to suggest hemorrhage. No abnormal extra-axial fluid collections. No extra-axial masses nor leptomeningeal enhancement. Normal major intracranial vascular flow voids seen at the skull base. 5 mm pineal cyst. Ocular globes and orbital contents are unremarkable though not tailored for evaluation. No suspicious calvarial bone marrow signal. No abnormal sellar expansion. Craniocervical junction maintained. Mild paranasal sinus mucosal thickening. Mastoid air cells are well  aerated. IMPRESSION: 6 mm pineal cyst, otherwise unremarkable MRI of the brain with and without contrast. Electronically Signed   By: CElon AlasM.D.   On: 01/24/2016 02:38   Mr Cervical Spine W Wo Contrast  01/24/2016  CLINICAL DATA:  Progressive lower extremity numbness, severe neuropathy, subsequent worsening. Possible autoimmune disease. EXAM: MRI CERVICAL SPINE WITHOUT AND WITH CONTRAST TECHNIQUE: Multiplanar and multiecho pulse sequences of the cervical spine, to include the craniocervical junction and cervicothoracic junction, were obtained according to standard protocol without and with intravenous contrast. CONTRAST:  20 cc MultiHance COMPARISON:  MRI of the cervical spine November 27, 2015 FINDINGS: Mildly motion degraded examination. Cervical vertebral bodies and posterior elements are intact and aligned and maintenance of cervical lordosis. Intervertebral discs demonstrate normal morphology and signal characteristics. No bone marrow signal to suggest acute osseous process. No abnormal osseous or intradiscal enhancement. Cervical spinal cord appears normal morphology and signal characteristics though, mild patient motion decreases sensitivity for subtle cord signal abnormality. No abnormal cord, leptomeningeal or epidural enhancement. Normal included prevertebral and paraspinal soft tissues. C2-3, C3-4: No disc bulge, canal stenosis nor neural foraminal narrowing. C4-5: Stable small central disc protrusion, no canal stenosis or neural foraminal narrowing. C6-7 through C7-T1: No disc bulge, canal stenosis nor neural foraminal narrowing. IMPRESSION: Mildly motion degraded examination. Stable small central disc protrusion at C4-5, otherwise unremarkable MRI of the cervical spine with and without contrast. Electronically Signed   By: CElon AlasM.D.   On: 01/24/2016 02:58        Assessment/Plan 1. 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  Unclear etiology.  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 -Hydromorphone PCA overnight, may be discontinued per Neurology if this impairs examination (on hydrocodone and Cymbalta at home, the former will be held while on PCA) -Consultation with Neurology, appreciate cares and plan as follows:  -Neuraxis imaging tonight with MR  -If imaging unrevealing, expect IVIG next  2. Anemia, folate and iron deficient:  This is chronic and stable.  Presumably this is related to gastric bypass, suspect nonadherence to vitamin supplements. -Recheck iron -Start iron and folate  3. Migraines, chronic:  -Continue topiramate, and cymbalta  4. Anxiety and depression:  -Hold clonazepam while on PCA -Continue carbamazepine  5. ADD:  -Hold Adderall while inpatient  6. Transaminitis:  Also old.  Negative MRCP and workup by GI in [place] and [place] -Trend CMP     DVT PPx: Lovenox Diet: Regular Consultants: Neurology Code Status: FULL Family Communication: None present  Medical decision making: What exists of the patient's previous chart and CareEverywhere was reviewed in depth and the case was discussed with Dr. Wilson Singer. Patient seen 10:40 PM on 01/23/2016.  Disposition Plan:  I recommend admission to med surg.  Clinical condition: stable.  Anticipate imaging and laboratory tests per Neurology, likely IVIG if imaging normal.      Edwin Dada [facility] Pager [phone]

## 2016-01-23 NOTE — Consult Note (Signed)
Neurology Consultation Reason for Consult: Pain and weakness Referring Physician: Wilson Singer, S  CC: Pain and weakness  History is obtained from: Patient, medical record  HPI: Penny [name] is a 30 y.o. female with a history of gastric bypass who is presenting with progressive lower extremity weakness starting in August 2015. She had scanning of her entire neuraxis at that time which did not reveal any abnormalities. She had EEG documented pseudoseizures recorded in September 2015.   In January 2016 she saw Dr. [name] for her lower extremity weakness. At that time she was not having any difficulty with controlling her bowel or bladder. She had started what using a walker at that time. At that time she was complaining of a right hemisensory deficit. She had a NCV done on 01/15/15 which showed evidence of a peripheral neuropathy with mixed axonal and demyelinating features. It was noted that there is a discordance between the nerve conduction study and the fact that the EMG was normal. At that time, he felt like this might be most consistent with an inherited neuropathy(e.g. Charcot Marie tooth) given the discordance between nerve conduction findings and EMG. The symptoms gradually improved.   Then she started having symptoms again in october of last year. She began falling more frequently in December and was admitted where she had an MRI brain without contrast, MR L-spine without contrast, MR C-spine with and without contrast. These did not show evidence for cause of her symptoms. She has been seeing Narda Amber. She has had a fairly extensive workup without a clear cause of her symptoms.    ROS: A 14 point ROS was performed and is negative except as noted in the HPI.  Past Medical History  Diagnosis Date  . Migraines   . Interstitial cystitis   . IUD FEB 2010  . Gastritis JULY 2011  . Depression   . 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 Fatty liver   . Obesity (BMI 30-39.9) 2011 228 LBS BMI  36.8  . Iron deficiency anemia 07/23/2010  . Irritable bowel syndrome 2012 DIARRHEA    JUN 2012 TTG IgA 14.9  . Elevated liver enzymes JUL 2011ALK PHOS 111-127 AST  143-267 ALT  213-321T BILI 0.6  ALB  3.7-4.06 Jun 2011 ALK PHOS 118 AST 24 ALT 42 T BILI 0.4 ALB 3.9  . Chronic daily headache   . Ovarian cyst   . ADD (attention deficit disorder)   . RLQ abdominal pain 07/18/2013  . Menorrhagia 07/18/2013  . Anxiety   . Potassium (K) deficiency   . Dysmenorrhea 09/18/2013  . Stress 09/18/2013  . Patient desires pregnancy 09/18/2013  . Pregnant 12/25/2013  . Blood transfusion without reported diagnosis   . Heart murmur   . Gastric bypass status for obesity   . 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 Dysrhythmia   . Sciatica of left side 11/14/2014  . Fibromyalgia   . 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 PONV (postoperative nausea and vomiting) seizure post-operatively  . Psychiatric pseudoseizure   . Depression   . Lupus ([place])      Family History  Problem Relation Age of Onset  . Hemochromatosis Maternal Grandmother   . Migraines Maternal Grandmother   . Cancer Maternal Grandmother   . Breast cancer Maternal Grandmother   . Hypertension Father   . Diabetes Father   . Coronary artery disease Father   . Migraines Paternal Grandmother   .  Breast cancer Paternal Grandmother   . Cancer Mother     breast  . Hemochromatosis Mother   . Breast cancer Mother   . Depression Mother   . Anxiety disorder Mother   . Coronary artery disease Paternal Grandfather   . Anxiety disorder Brother   . Bipolar disorder Brother   . Healthy Son   . Healthy Daughter      Social History:  reports that she quit smoking about 7 weeks ago. Her smoking use included Cigarettes. She has a 1.5 pack-year smoking history. She has never used smokeless tobacco. She reports that she drinks alcohol. She reports that she does not use illicit drugs.   Exam: Current vital  signs: BP 110/90 mmHg  Pulse 114  Temp(Src) 98.1 F (36.7 C) (Oral)  Resp 16  SpO2 98%  LMP 12/21/2015 Vital signs in last 24 hours: Temp:  [98.1 F (36.7 C)] 98.1 F (36.7 C) (02/17 1735) Pulse Rate:  [114] 114 (02/17 1735) Resp:  [16] 16 (02/17 1735) BP: (110)/(90) 110/90 mmHg (02/17 1735) SpO2:  [98 %] 98 % (02/17 1735)   Physical Exam  Constitutional: Appears well-developed and well-nourished.  Psych: Affect appropriate to situation Eyes: No scleral injection HENT: No OP obstrucion Head: Normocephalic.  Cardiovascular: Normal rate and regular rhythm.  Respiratory: Effort normal and breath sounds normal to anterior ascultation GI: Soft.  No distension. There is no tenderness.  Skin: WDI  Neuro: Mental Status: Patient is awake, alert, oriented to person, place, month, year, and situation. Patient is able to give a clear and coherent history. No signs of aphasia or neglect Cranial Nerves: II: Visual Fields are full. Pupils are equal, round, and reactive to light.   III,IV, VI: EOMI without ptosis or diploplia.  V: Facial sensation is decreased on the left VII: Facial movement is symmetric.  VIII: hearing is intact to voice X: Uvula elevates symmetrically XI: Shoulder shrug is symmetric. XII: tongue is midline without atrophy or fasciculations.  Motor: Tone is normal. Bulk is normal. 5/5 strength was present in the upper extremities. She has 3/5 strength proximally, 1 - 2/5 strength distally in both legs in a length dependant pattern.  Sensory: Sensation is diminished throughout the elft side  Deep Tendon Reflexes: Diminished bilaterally at the knees, but patient will not relax well, similar in upper extermities, difficult to get the patient to relax enough for reliable assessment.  Cerebellar: FNF intact bilaterally    I have reviewed labs in epic and the results pertinent to this consultation are: In January she had: ANA, SSA/B, ANCA, cryogobulins, copper,  ceruloplasmin, zinc, RPR, heavy metals, selenium, SPEP/UPEP with IFE  And her CSF was sent for (according to Dr. Serita Grit note): CSF cell count and diff, protein, glucose, routine cultures, fungal cultures, IgG index, oligoclonal bands, myelin basic protein, flow cytology, ACE, CSF lyme PCR  Her ANA was positive, titer 1:80 speckled pattern.  I do not see the CSF studies documented, however, Dr. Serita Grit note from 1/27 indicates that it was normal.   I have reviewed the images obtained: Previous MRI from 11/2015 reviewed-no clear findings  Impression: 29 year old female with progressive lower extremity weakness and numbness with nerve conduction studies proving severe neuropathy. Her initial symptoms were in the setting of acute physiological stressor from childbirth. Subsequent worsening, without clear physiological stressor. Worsening of symptoms in the setting of acute physiological stress could be consistent with an autoimmune disease. Given her findings and worsening Dr. Patel(fellowship trained neuromuscular specialist) is recommending an  IVIG trial.  She has clear psychogenic comorbidities including pseudoseizures, however the documented nerve conduction findings and EMG findings are very real. The left hemibody involvement is very unusual for a peripheral process and therefore I think further imaging needs to be evaluated. I wonder about a possible myeloneuropathic process such as X-linked adrenoleukodystrophy female phenotype. I think nerve biopsy could be helpful as a could establish an inflammatory nature to this process, but would defer to Dr. Posey Pronto decision to pursue that.  She has not had contrast enhancement of her previous MRIs except her cervical spine and therefore I will pursue further imaging repeating neuraxis imaging with contrast. This could be helpful to show signs of a myeloneuropathy and therefore will need C and T-spine, given that she does have some facial involvement will  need the brain, and given that nerve root enhancement would be very helpful finding, we'll need the L-spine as well.  Though IVIG is not completely without risk given that she is acutely worsening and has been steadily worsening over the past couple of months with documented EMG findings, I think that a could be reasonable to pursue this  Recommendations: 1) imaging of the entire neuraxis with and without contrast 2) I will draw serum to be stored and after discussion with Dr. Posey Pronto, could consider sending further autoimmune testing. As will need to be drawn prior to IVIG treatment 3) if imaging remains negative, then I think considering IVIG as a trial is reasonable given her rapid worsening over the past few months. 4) Would discuss with Dr. Posey Pronto on Monday when she is back in clinic decision to pursue nerve biopsy or further autoimmune testing   Roland Rack, MD [facility] [phone]  If 7pm- 7am, please page neurology on call as listed in [place].

## 2016-01-23 NOTE — ED Notes (Signed)
Pt here for back and leg pain that started yesterday and has no feelings in her legs.

## 2016-01-24 ENCOUNTER — Encounter (HOSPITAL_COMMUNITY): Payer: Self-pay | Admitting: *Deleted

## 2016-01-24 DIAGNOSIS — D509 Iron deficiency anemia, unspecified: Secondary | ICD-10-CM

## 2016-01-24 DIAGNOSIS — F418 Other specified anxiety disorders: Secondary | ICD-10-CM

## 2016-01-24 DIAGNOSIS — E440 Moderate protein-calorie malnutrition: Secondary | ICD-10-CM | POA: Insufficient documentation

## 2016-01-24 DIAGNOSIS — R740 Nonspecific elevation of levels of transaminase and lactic acid dehydrogenase [LDH]: Secondary | ICD-10-CM

## 2016-01-24 DIAGNOSIS — G629 Polyneuropathy, unspecified: Secondary | ICD-10-CM

## 2016-01-24 DIAGNOSIS — R52 Pain, unspecified: Secondary | ICD-10-CM

## 2016-01-24 LAB — VITAMIN B12: VITAMIN B 12: 401 pg/mL (ref 180–914)

## 2016-01-24 LAB — IRON AND TIBC
Iron: 26 ug/dL — ABNORMAL LOW (ref 28–170)
Saturation Ratios: 5 % — ABNORMAL LOW (ref 10.4–31.8)
TIBC: 480 ug/dL — ABNORMAL HIGH (ref 250–450)
UIBC: 454 ug/dL

## 2016-01-24 LAB — FERRITIN: FERRITIN: 8 ng/mL — AB (ref 11–307)

## 2016-01-24 LAB — RETICULOCYTES
RBC.: 3.9 MIL/uL (ref 3.87–5.11)
RETIC COUNT ABSOLUTE: 62.4 10*3/uL (ref 19.0–186.0)
RETIC CT PCT: 1.6 % (ref 0.4–3.1)

## 2016-01-24 MED ORDER — ACETAMINOPHEN 325 MG PO TABS
650.0000 mg | ORAL_TABLET | Freq: Four times a day (QID) | ORAL | Status: DC | PRN
  Administered 2016-01-24 – 2016-01-25 (×2): 650 mg via ORAL
  Filled 2016-01-24 (×2): qty 2

## 2016-01-24 MED ORDER — SODIUM CHLORIDE 0.9% FLUSH: 9.0000 mL | INTRAVENOUS | Status: DC | PRN

## 2016-01-24 MED ORDER — NALOXONE HCL 0.4 MG/ML IJ SOLN: 0.4000 mg | INTRAMUSCULAR | Status: DC | PRN

## 2016-01-24 MED ORDER — TRAZODONE HCL 50 MG PO TABS: 150.0000 mg | ORAL_TABLET | Freq: Every evening | ORAL | Status: DC | PRN

## 2016-01-24 MED ORDER — TIZANIDINE HCL 4 MG PO TABS
4.0000 mg | ORAL_TABLET | Freq: Three times a day (TID) | ORAL | Status: DC | PRN
  Administered 2016-01-24 – 2016-01-26 (×5): 4 mg via ORAL
  Filled 2016-01-24 (×7): qty 1

## 2016-01-24 MED ORDER — TOPIRAMATE 100 MG PO TABS
100.0000 mg | ORAL_TABLET | Freq: Every day | ORAL | Status: DC
  Administered 2016-01-24 – 2016-01-25 (×2): 100 mg via ORAL
  Filled 2016-01-24 (×2): qty 1

## 2016-01-24 MED ORDER — HYDROMORPHONE HCL 1 MG/ML IJ SOLN
1.0000 mg | INTRAMUSCULAR | Status: DC | PRN
  Administered 2016-01-24 – 2016-01-26 (×12): 1 mg via INTRAVENOUS
  Filled 2016-01-24 (×13): qty 1

## 2016-01-24 MED ORDER — CLONAZEPAM 0.5 MG PO TABS
0.5000 mg | ORAL_TABLET | Freq: Three times a day (TID) | ORAL | Status: DC | PRN
  Administered 2016-01-24 – 2016-01-26 (×3): 0.5 mg via ORAL
  Filled 2016-01-24 (×5): qty 1

## 2016-01-24 MED ORDER — FOLIC ACID 1 MG PO TABS
1.0000 mg | ORAL_TABLET | Freq: Every day | ORAL | Status: DC
  Administered 2016-01-24 – 2016-01-26 (×3): 1 mg via ORAL
  Filled 2016-01-24 (×3): qty 1

## 2016-01-24 MED ORDER — DOCUSATE SODIUM 100 MG PO CAPS
100.0000 mg | ORAL_CAPSULE | Freq: Every day | ORAL | Status: DC
  Administered 2016-01-24 – 2016-01-26 (×3): 100 mg via ORAL
  Filled 2016-01-24 (×3): qty 1

## 2016-01-24 MED ORDER — DIPHENHYDRAMINE HCL 12.5 MG/5ML PO ELIX: 12.5000 mg | ORAL_SOLUTION | Freq: Four times a day (QID) | ORAL | Status: DC | PRN

## 2016-01-24 MED ORDER — POLYETHYLENE GLYCOL 3350 17 G PO PACK
17.0000 g | PACK | Freq: Every day | ORAL | Status: DC | PRN
  Administered 2016-01-24 – 2016-01-26 (×3): 17 g via ORAL
  Filled 2016-01-24 (×3): qty 1

## 2016-01-24 MED ORDER — ONDANSETRON HCL 4 MG/2ML IJ SOLN
4.0000 mg | Freq: Four times a day (QID) | INTRAMUSCULAR | Status: DC | PRN
  Administered 2016-01-24 – 2016-01-26 (×4): 4 mg via INTRAVENOUS
  Filled 2016-01-24 (×4): qty 2

## 2016-01-24 MED ORDER — DIPHENHYDRAMINE HCL 50 MG/ML IJ SOLN
12.5000 mg | Freq: Four times a day (QID) | INTRAMUSCULAR | Status: DC | PRN
  Administered 2016-01-24: 12.5 mg via INTRAVENOUS
  Filled 2016-01-24: qty 1

## 2016-01-24 MED ORDER — SODIUM CHLORIDE 0.9 % IV SOLN
510.0000 mg | Freq: Once | INTRAVENOUS | Status: AC
  Administered 2016-01-24: 510 mg via INTRAVENOUS
  Filled 2016-01-24: qty 17

## 2016-01-24 MED ORDER — HYDROMORPHONE 1 MG/ML IV SOLN
INTRAVENOUS | Status: DC
  Administered 2016-01-24: 1.2 mg via INTRAVENOUS
  Administered 2016-01-24: 1.8 mg via INTRAVENOUS
  Administered 2016-01-24: 4.2 mg via INTRAVENOUS

## 2016-01-24 MED ORDER — TOPIRAMATE 25 MG PO TABS
50.0000 mg | ORAL_TABLET | Freq: Every day | ORAL | Status: DC
  Administered 2016-01-24 – 2016-01-26 (×3): 50 mg via ORAL
  Filled 2016-01-24 (×4): qty 2

## 2016-01-24 MED ORDER — GADOBENATE DIMEGLUMINE 529 MG/ML IV SOLN
20.0000 mL | Freq: Once | INTRAVENOUS | Status: AC | PRN
  Administered 2016-01-24: 20 mL via INTRAVENOUS

## 2016-01-24 MED ORDER — DULOXETINE HCL 60 MG PO CPEP
60.0000 mg | ORAL_CAPSULE | Freq: Every day | ORAL | Status: DC
  Administered 2016-01-24 – 2016-01-26 (×3): 60 mg via ORAL
  Filled 2016-01-24 (×3): qty 1

## 2016-01-24 MED ORDER — FERROUS SULFATE 325 (65 FE) MG PO TABS
325.0000 mg | ORAL_TABLET | Freq: Two times a day (BID) | ORAL | Status: DC
  Administered 2016-01-24 – 2016-01-26 (×5): 325 mg via ORAL
  Filled 2016-01-24 (×5): qty 1

## 2016-01-24 MED ORDER — PROMETHAZINE HCL 25 MG PO TABS
12.5000 mg | ORAL_TABLET | Freq: Four times a day (QID) | ORAL | Status: DC | PRN
  Administered 2016-01-24 – 2016-01-25 (×3): 12.5 mg via ORAL
  Filled 2016-01-24 (×3): qty 1

## 2016-01-24 MED ORDER — ENOXAPARIN SODIUM 40 MG/0.4ML ~~LOC~~ SOLN
40.0000 mg | SUBCUTANEOUS | Status: DC
Start: 2016-01-24 — End: 2016-01-26
  Administered 2016-01-24 – 2016-01-26 (×3): 40 mg via SUBCUTANEOUS
  Filled 2016-01-24 (×3): qty 0.4

## 2016-01-24 MED ORDER — DIPHENHYDRAMINE HCL 50 MG/ML IJ SOLN: 12.5000 mg | Freq: Four times a day (QID) | INTRAMUSCULAR | Status: DC | PRN

## 2016-01-24 MED ORDER — CARBAMAZEPINE 200 MG PO TABS
400.0000 mg | ORAL_TABLET | Freq: Every day | ORAL | Status: DC
  Administered 2016-01-24 – 2016-01-25 (×2): 400 mg via ORAL
  Filled 2016-01-24 (×2): qty 2

## 2016-01-24 MED ORDER — HYDROMORPHONE 1 MG/ML IV SOLN
INTRAVENOUS | Status: DC
  Administered 2016-01-24: 03:00:00 via INTRAVENOUS
  Filled 2016-01-24: qty 25

## 2016-01-24 NOTE — Progress Notes (Signed)
PROGRESS NOTE  Penny [name] YQM:[id number] DOB: [birth year] DOA: 01/23/2016 PCP: [name], MD Brief History A 30 year old female with a history of chronic migraine headaches, interstitial cystitis, anemia, transaminasemia, Roux-en-Y gastric bypass, presented with worsening bilateral lower extremity weakness and back pain. The patient has followed up with neuromuscular specialist, Nita Sickle, MD on Jan. 27, 2017. Briefly, the patient has had roughly 18 months of low back pain, left leg pain, left leg weakness, and tinglings in the distal legs and arms, all which first manifested around the time of the birth of her last child. Dr. Allena Katz cataloged the following primary complaints: Low back pain, severe and sharp, worsened with movement, left leg paresthesias, paresthesias of the face and arms that are painful, urge incontinence without bowel incontinence, and bilateral leg weakness left greater than right. Her workup included negative ANCA, negative SPEP, negative RPR, negative ESR and CRP, weakly positive ANA, normal TSH, and normal CSF cell count/differential, fungal cultures, IgG, oligoclonal bands, cytology, CSF Lyme PCR. 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 After admission, neurology was consulted and recommended MRI of the neural axis.  Interim summary 01/24/16--I evaluated the patient at 0810 after I was paged by RN that the patient wanted to discontinue hydromorphone PCA and start IV push hydromorphone. I kindly requested RN to interrogate the pump to identify the patient's total demand since starting the PCA pump. I had a discussion with the patient regarding long-term analgesic control would be better with the PCA pump.  I told the patient that once I get the data back from her RN, I may make some changes based upon her demand since starting the PCA. I have explained to the pt  that a pain scale rating of zero is not realistic in the setting of her acute on chronic pain syndrome  As I examined the patient and spoke with the patient, the patient appeared to be comfortable without extremis or uncontrolled pain. She was not tachycardic on examination. She was able to rotate to the left and the right on the bed and grabbed the bed rails without difficulty or pain . 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  -plans noted for possible IVIG per neurology Chronic pain syndrome  -The patient has some opioid seeking behavior  -Please note previous history of opioid abuse  -Continue hydromorphone PCA pump as discussed above  -Patient has some nonphysiologic components of her physical examination--this has also been noted previously by Dr. Allena Katz  Iron deficiency anemia -Recheck entire iron panel -Patient endorses compliance with oral iron Folate deficiency -Check RBC folate Anxiety/depression -Continue Tegretol, Cymbalta Chronic migraine -Continue Topamax -Continue carbamazepine Transaminasemia -07/09/2015 MRCP--negative for choledocholithiasis;Revealed normal liver parenchyma  -Extensive workup including but not limited to autoimmune serology, hepatitis serologies and HIV are negative  -Most recent ANA on 12/12/2015-- 1:80    Family Communication:   Significant other updtate at beside Disposition Plan:   Home when  Neurologic work up and intervention complete; anticipate > 3 days      Procedures/Studies: Dg Chest 2 View  12/28/2015  CLINICAL DATA:  History of lupus.  Seizure today. EXAM: CHEST  2 VIEW COMPARISON:  09/12/2014 FINDINGS:  Cardiomediastinal silhouette is normal. Mediastinal contours appear intact. There is no evidence of focal airspace consolidation, pleural effusion or pneumothorax. Osseous structures are without acute abnormality. Soft tissues are grossly normal. IMPRESSION: No active cardiopulmonary disease. Electronically Signed   By: Ted Mcalpine M.D.   On: 12/28/2015 17:01   Dg Cervical Spine Complete  12/30/2015  CLINICAL DATA:  Status post fall, with neck pain. Initial encounter. EXAM: CERVICAL SPINE - COMPLETE 4+ VIEW COMPARISON:  CT of the cervical spine performed 05/09/2015, and MRI of the cervical spine performed 11/27/2015 FINDINGS: There is no evidence of fracture or subluxation. Vertebral bodies demonstrate normal height and alignment. Intervertebral disc spaces are preserved. Prevertebral soft tissues are within normal limits. The provided odontoid view demonstrates no significant abnormality. The visualized lung apices are clear. IMPRESSION: No evidence of fracture or subluxation along the cervical spine. Electronically Signed   By: Roanna Raider M.D.   On: 12/30/2015 06:17   Dg Lumbar Spine Complete  12/30/2015  CLINICAL DATA:  Acute onset of lower back pain, status post fall. Initial encounter. EXAM: LUMBAR SPINE - COMPLETE 4+ VIEW COMPARISON:  MRI of the lumbar spine performed 11/26/2015 FINDINGS: There is no evidence of fracture or subluxation. Vertebral bodies demonstrate normal height and alignment. Intervertebral disc spaces are preserved. The visualized neural foramina are grossly unremarkable in appearance. The visualized bowel gas pattern is unremarkable in appearance; air and stool are noted within the colon. The sacroiliac joints are within normal limits. IMPRESSION: No evidence of fracture or subluxation along the lumbar spine. Electronically Signed   By: Roanna Raider M.D.   On: 12/30/2015 06:18   Dg Pelvis 1-2 Views  12/30/2015  CLINICAL DATA:  Status post seizure and fall, with  bilateral hip pain. Initial encounter. EXAM: PELVIS - 1-2 VIEW COMPARISON:  CT of the abdomen and pelvis from 06/11/2015 FINDINGS: There is no evidence of fracture or dislocation. Both femoral heads are seated normally within their respective acetabula. No significant degenerative change is appreciated. The sacroiliac joints are unremarkable in appearance. The visualized bowel gas pattern is grossly unremarkable in appearance. IMPRESSION: No evidence of fracture or dislocation. Electronically Signed   By: Roanna Raider M.D.   On: 12/30/2015 06:16   Mr Laqueta Jean NW Contrast  01/24/2016  CLINICAL DATA:  Progressive lower extremity numbness, severe neuropathy, subsequent worsening. Possible autoimmune disease. EXAM: MRI HEAD WITHOUT AND WITH CONTRAST TECHNIQUE: Multiplanar, multiecho pulse sequences of the brain and surrounding structures were obtained without and with intravenous contrast. CONTRAST:  20mL MULTIHANCE GADOBENATE DIMEGLUMINE 529 MG/ML IV SOLN COMPARISON:  MRI of the head November 27, 2015 FINDINGS: The ventricles and sulci are normal for patient's age. No abnormal parenchymal signal, mass lesions, mass effect. No abnormal parenchymal enhancement. No reduced diffusion to suggest acute ischemia. No susceptibility artifact to suggest hemorrhage. No abnormal extra-axial fluid collections. No extra-axial masses nor leptomeningeal enhancement. Normal major intracranial vascular flow voids seen at the skull base. 5 mm pineal cyst. Ocular globes and orbital contents are unremarkable though not tailored for evaluation. No suspicious calvarial bone marrow signal. No abnormal sellar expansion. Craniocervical junction maintained. Mild paranasal sinus mucosal thickening. Mastoid air cells are well aerated. IMPRESSION: 6 mm pineal cyst, otherwise unremarkable MRI of the brain with and without contrast. Electronically Signed   By: Awilda Metro M.D.   On: 01/24/2016 02:38   Mr Cervical Spine W Wo  Contrast  01/24/2016  CLINICAL DATA:  Progressive lower extremity numbness, severe neuropathy, subsequent  worsening. Possible autoimmune disease. EXAM: MRI CERVICAL SPINE WITHOUT AND WITH CONTRAST TECHNIQUE: Multiplanar and multiecho pulse sequences of the cervical spine, to include the craniocervical junction and cervicothoracic junction, were obtained according to standard protocol without and with intravenous contrast. CONTRAST:  20 cc MultiHance COMPARISON:  MRI of the cervical spine November 27, 2015 FINDINGS: Mildly motion degraded examination. Cervical vertebral bodies and posterior elements are intact and aligned and maintenance of cervical lordosis. Intervertebral discs demonstrate normal morphology and signal characteristics. No bone marrow signal to suggest acute osseous process. No abnormal osseous or intradiscal enhancement. Cervical spinal cord appears normal morphology and signal characteristics though, mild patient motion decreases sensitivity for subtle cord signal abnormality. No abnormal cord, leptomeningeal or epidural enhancement. Normal included prevertebral and paraspinal soft tissues. C2-3, C3-4: No disc bulge, canal stenosis nor neural foraminal narrowing. C4-5: Stable small central disc protrusion, no canal stenosis or neural foraminal narrowing. C6-7 through C7-T1: No disc bulge, canal stenosis nor neural foraminal narrowing. IMPRESSION: Mildly motion degraded examination. Stable small central disc protrusion at C4-5, otherwise unremarkable MRI of the cervical spine with and without contrast. Electronically Signed   By: Awilda Metro M.D.   On: 01/24/2016 02:58   Mr Thoracic Spine W Wo Contrast  01/24/2016  CLINICAL DATA:  Progressive lower extremity numbness, severe neuropathy, subsequent worsening. Possible autoimmune disease. EXAM: MRI THORACIC AND LUMBAR SPINE WITHOUT AND WITH CONTRAST TECHNIQUE: Multiplanar and multiecho pulse sequences of the thoracic and lumbar spine were  obtained without and with intravenous contrast. CONTRAST:  20 cc MultiHance COMPARISON:  MRI of the lumbar spine November 26, 2015 FINDINGS: MR THORACIC SPINE FINDINGS Mild motion degraded examination. Mild T8 chronic compression fracture less than 20% height loss. The remaining thoracic vertebral bodies intact and aligned. Maintenance of thoracic kyphosis. Intervertebral discs demonstrate normal morphology. No was bone marrow signal to suggest acute osseous process. No abnormal osseous or intradiscal enhancement. Bright T1 and bright T2 small hemangiomas T6 and T7. Thoracic spinal cord is normal morphology and signal characteristics the level the conus medullaris which terminates at L1. No abnormal cord, leptomeningeal or epidural enhancement. Included prevertebral soft tissues are normal. Dependent atelectasis in the lung bases. No disc bulge, canal stenosis or neural foraminal narrowing at any level. MR LUMBAR SPINE FINDINGS Lumbar vertebral bodies and posterior elements are intact and aligned with maintenance of lumbar lordosis. Mild L3-4 and L4-5 disc height loss is similar to prior examination with decreased T2 signal within these 2 discs consistent with mild desiccation. No bone marrow signal abnormality to suggest acute osseous process. No abnormal osseous or intradiscal enhancement. Conus medullaris terminates at L1 and appears normal morphology and signal characteristics. Cauda equina is normal. No abnormal cord, leptomeningeal or epidural enhancement. Included prevertebral and paraspinal soft tissues are normal. Level by level evaluation: L1-2, L2-3: No disc bulge, canal stenosis nor neural foraminal narrowing. L3-4: Small RIGHT central disc protrusion and enhancing annular fissure is similar to prior examination. No canal stenosis or neural foraminal narrowing. L4-5: Similar RIGHT central 3 mm disc extrusion, 11 mm contiguous superior and inferior migration, similar to prior examination. No canal  stenosis or neural foraminal narrowing. L5-S1: No disc bulge, canal stenosis nor neural foraminal narrowing. IMPRESSION: MRI THORACIC SPINE:  Mild motion degraded examination. Old mild T8 compression fracture with otherwise unremarkable MRI of the thoracic spine with and without contrast. MRI LUMBAR SPINE: Stable small L3-4 disc protrusion and small L4-5 contiguous disc extrusion. Otherwise unremarkable MRI of the lumbar spine without contrast.  Electronically Signed   By: Awilda Metro M.D.   On: 01/24/2016 03:19   Mr Lumbar Spine W Wo Contrast  01/24/2016  CLINICAL DATA:  Progressive lower extremity numbness, severe neuropathy, subsequent worsening. Possible autoimmune disease. EXAM: MRI THORACIC AND LUMBAR SPINE WITHOUT AND WITH CONTRAST TECHNIQUE: Multiplanar and multiecho pulse sequences of the thoracic and lumbar spine were obtained without and with intravenous contrast. CONTRAST:  20 cc MultiHance COMPARISON:  MRI of the lumbar spine November 26, 2015 FINDINGS: MR THORACIC SPINE FINDINGS Mild motion degraded examination. Mild T8 chronic compression fracture less than 20% height loss. The remaining thoracic vertebral bodies intact and aligned. Maintenance of thoracic kyphosis. Intervertebral discs demonstrate normal morphology. No was bone marrow signal to suggest acute osseous process. No abnormal osseous or intradiscal enhancement. Bright T1 and bright T2 small hemangiomas T6 and T7. Thoracic spinal cord is normal morphology and signal characteristics the level the conus medullaris which terminates at L1. No abnormal cord, leptomeningeal or epidural enhancement. Included prevertebral soft tissues are normal. Dependent atelectasis in the lung bases. No disc bulge, canal stenosis or neural foraminal narrowing at any level. MR LUMBAR SPINE FINDINGS Lumbar vertebral bodies and posterior elements are intact and aligned with maintenance of lumbar lordosis. Mild L3-4 and L4-5 disc height loss is similar to  prior examination with decreased T2 signal within these 2 discs consistent with mild desiccation. No bone marrow signal abnormality to suggest acute osseous process. No abnormal osseous or intradiscal enhancement. Conus medullaris terminates at L1 and appears normal morphology and signal characteristics. Cauda equina is normal. No abnormal cord, leptomeningeal or epidural enhancement. Included prevertebral and paraspinal soft tissues are normal. Level by level evaluation: L1-2, L2-3: No disc bulge, canal stenosis nor neural foraminal narrowing. L3-4: Small RIGHT central disc protrusion and enhancing annular fissure is similar to prior examination. No canal stenosis or neural foraminal narrowing. L4-5: Similar RIGHT central 3 mm disc extrusion, 11 mm contiguous superior and inferior migration, similar to prior examination. No canal stenosis or neural foraminal narrowing. L5-S1: No disc bulge, canal stenosis nor neural foraminal narrowing. IMPRESSION: MRI THORACIC SPINE:  Mild motion degraded examination. Old mild T8 compression fracture with otherwise unremarkable MRI of the thoracic spine with and without contrast. MRI LUMBAR SPINE: Stable small L3-4 disc protrusion and small L4-5 contiguous disc extrusion. Otherwise unremarkable MRI of the lumbar spine without contrast. Electronically Signed   By: Awilda Metro M.D.   On: 01/24/2016 03:19         Subjective:  patient continues to complain of low back pain. Denies any fevers, chills, headache, chest pain, breath, nausea, vomiting, diarrhea. No abdominal pain, dysuria, rashes, synovitis.  Objective: Filed Vitals:   01/24/16 0225 01/24/16 0248 01/24/16 0325 01/24/16 0622  BP: 117/71 120/76  99/54  Pulse: 65 64  56  Temp: 98 F (36.7 C) 98 F (36.7 C)  97.9 F (36.6 C)  TempSrc: Oral Oral  Oral  Resp: 16  14 16   Height:  5\' 7"  (1.702 m)    Weight:  90.266 kg (199 lb)    SpO2: 100% 99% 100% 98%   No intake or output data in the 24 hours  ending 01/24/16 0818 Weight change:  Exam:   General:  Pt is alert, follows commands appropriately, not in acute distress  HEENT: No icterus, No thrush, No neck mass, [place]/AT  Cardiovascular: RRR, S1/S2, no rubs, no gallops  Respiratory: CTA bilaterally, no wheezing, no crackles, no rhonchi  Abdomen: Soft/+BS, non tender, non  distended, no guarding; no hepatosplenomegaly  Extremities: No edema, No lymphangitis, No petechiae, No rashes, no synovitis Neuro:  CN II-XII intact, strength 4/5 in RUE,  strength 4/5 LUE sensation intact bilateral; no dysmetria; strength 2/5 bilateral LE   Data Reviewed: Basic Metabolic Panel:  Recent Labs Lab 01/23/16 1816  NA 140  K 3.9  CL 107  CO2 20*  GLUCOSE 91  BUN 11  CREATININE 0.60  CALCIUM 9.6   Liver Function Tests:  Recent Labs Lab 01/23/16 1816  AST 68*  ALT 93*  ALKPHOS 112  BILITOT 0.3  PROT 7.4  ALBUMIN 4.4   No results for input(s): LIPASE, AMYLASE in the last 168 hours. No results for input(s): AMMONIA in the last 168 hours. CBC:  Recent Labs Lab 01/23/16 1816  WBC 5.6  NEUTROABS 2.6  HGB 10.0*  HCT 32.8*  MCV 82.6  PLT 349   Cardiac Enzymes: No results for input(s): CKTOTAL, CKMB, CKMBINDEX, TROPONINI in the last 168 hours. BNP: Invalid input(s): POCBNP CBG: No results for input(s): GLUCAP in the last 168 hours.  No results found for this or any previous visit (from the past 240 hour(s)).   Scheduled Meds: . carbamazepine  400 mg Oral QHS  . docusate sodium  100 mg Oral Daily  . DULoxetine  60 mg Oral Daily  . enoxaparin (LOVENOX) injection  40 mg Subcutaneous Q24H  . ferrous sulfate  325 mg Oral BID WC  . folic acid  1 mg Oral Daily  . HYDROmorphone   Intravenous 6 times per day  . ketorolac  15 mg Intravenous Once  . topiramate  100 mg Oral QHS  . topiramate  50 mg Oral Daily   Continuous Infusions:    Collin Rengel, DO  [facility] Pager [phone]  If 7PM-7AM, please contact  night-coverage [email or web address] Password [email or web address] 01/24/2016, 8:18 AM   LOS: 1 day

## 2016-01-24 NOTE — Progress Notes (Signed)
Pt admitted from ED with BLE weakness, pt alert and oriented, c/o of pain in back and legs, settled in bed with call light at bedside, pt reassured, v/s stable, will continue to monitor. Obasogie-Asidi, Damen Windsor Efe

## 2016-01-24 NOTE — Progress Notes (Signed)
Spoke with patient on phone at 1050.  Pt upset.  She alleges that I called her a drug seeker.  Discussed with patient that I have not said any such statement.  She stated I told her that I would increase the settings on her hydromorphone.  I told her that I asked her RN to interrogate her PCA pump and based upon those settings, I would make adjustments if medically indicated. I told the patient that it is not realistic to have pain of zero.  I also offered the patient another opinion from another physician if she so desires.  I again asked her RN a second time to interrogate the pump to identify her hydromorphone demand.  DTat

## 2016-01-24 NOTE — Progress Notes (Addendum)
Paged by RN at 1103AM regarding hydromorphone pump Pt has had 7.1mg  since 330AM Therefore, the patient has required <1.0 mg per hour since initiation of pump. Current setting allows up to 1.25 mg per hour.  Adjust setting to 0.3mg / dose with lock out q 8 minutes with max of 2.1 mg/hour  DTat

## 2016-01-24 NOTE — Progress Notes (Signed)
Initial Nutrition Assessment  DOCUMENTATION CODES:   Non-severe (moderate) malnutrition in context of acute illness/injury, Obesity unspecified  INTERVENTION:  - Encourage PO intakes as pt is able - RD will continue to monitor for needs  NUTRITION DIAGNOSIS:   Inadequate oral intake related to poor appetite, nausea as evidenced by per patient/family report.  GOAL:   Patient will meet greater than or equal to 90% of their needs  MONITOR:   PO intake, Weight trends, Labs, I & O's  REASON FOR ASSESSMENT:   Malnutrition Screening Tool  ASSESSMENT:   30 y.o. female with a past medical history significant for chronic migraines, interstitial cystitis, depression, anemia, unexplained transaminitis, and hx of Roux-Y gastric bypass, and now progressive distal neuropathy who presents with back pain and leg weakness.  Pt seen for MST. BMI indicates obesity. Per chart review, pt ate 25% of breakfast and she states she ate a similar amount for lunch today. Pt states that she is not feeling well today and that "today has been rough and eating that sandwich (for lunch) was really rough." Pt states she has PRN Phenergan ordered and plans to request this from RN following RD visit. Pt states that appetite has been poor x6-7 months with ongoing nausea during this time frame. PO intakes intermittently exacerbate feelings of nausea. Pt states that she has lost weight over this time frame but is unable to give further details. Per chart review, she has lost 12 lbs (6% body weight) in the past 1 month which is significant for time frame. Weight has fluctuated (198-216 lbs) over the past 3 months; will continue to monitor weight trends. Pt requests that physical assessment not be done at this time and requests further discussion concerning nutrition occur at another time due to feelings of weakness and general unwell.  Not meeting needs. Medications reviewed. Labs reviewed.   Diet Order:  Diet regular  Room service appropriate?: Yes; Fluid consistency:: Thin  Skin:  Reviewed, no issues  Last BM:  2/15  Height:   Ht Readings from Last 1 Encounters:  01/24/16 5\' 7"  (1.702 m)    Weight:   Wt Readings from Last 1 Encounters:  01/24/16 199 lb (90.266 kg)    Ideal Body Weight:  61.36 kg (kg)  BMI:  Body mass index is 31.16 kg/(m^2).  Estimated Nutritional Needs:   Kcal:  1800-2000  Protein:  80-90 grams  Fluid:  >/= 2 L/day  EDUCATION NEEDS:   No education needs identified at this time    Penny [name], RD, LDN Inpatient Clinical Dietitian Pager # [phone] After hours/weekend pager # [phone]

## 2016-01-24 NOTE — ED Notes (Signed)
Patient transported to MRI 

## 2016-01-25 DIAGNOSIS — R531 Weakness: Secondary | ICD-10-CM | POA: Insufficient documentation

## 2016-01-25 DIAGNOSIS — M6289 Other specified disorders of muscle: Secondary | ICD-10-CM

## 2016-01-25 DIAGNOSIS — F900 Attention-deficit hyperactivity disorder, predominantly inattentive type: Secondary | ICD-10-CM

## 2016-01-25 DIAGNOSIS — E440 Moderate protein-calorie malnutrition: Secondary | ICD-10-CM

## 2016-01-25 LAB — IGA: IGA: 136 mg/dL (ref 87–352)

## 2016-01-25 MED ORDER — SENNA 8.6 MG PO TABS
2.0000 | ORAL_TABLET | Freq: Every day | ORAL | Status: DC
  Administered 2016-01-25 – 2016-01-26 (×2): 17.2 mg via ORAL
  Filled 2016-01-25 (×2): qty 2

## 2016-01-25 MED ORDER — BACLOFEN 10 MG PO TABS
5.0000 mg | ORAL_TABLET | Freq: Every day | ORAL | Status: DC
  Administered 2016-01-25: 5 mg via ORAL
  Filled 2016-01-25: qty 1

## 2016-01-25 MED ORDER — OXYCODONE HCL ER 10 MG PO T12A
20.0000 mg | EXTENDED_RELEASE_TABLET | Freq: Two times a day (BID) | ORAL | Status: DC
  Administered 2016-01-25 – 2016-01-26 (×2): 20 mg via ORAL
  Filled 2016-01-25 (×2): qty 2

## 2016-01-25 NOTE — Progress Notes (Addendum)
PROGRESS NOTE  Penny [name]:[id number] DOB: January 24, 1986 DOA: 01/23/2016 PCP: [name], MD  Brief History A 30 year old female with a history of chronic migraine headaches, interstitial cystitis, anemia, transaminasemia, Roux-en-Y gastric bypass, presented with worsening bilateral lower extremity weakness and back pain. The patient has followed up with neuromuscular specialist, Nita Sickle, MD on Jan. 27, 2017. Briefly, the patient has had roughly 18 months of low back pain, left leg pain, left leg weakness, and tinglings in the distal legs and arms, all which first manifested around the time of the birth of her last child. Dr. Allena Katz cataloged the following primary complaints: Low back pain, severe and sharp, worsened with movement, left leg paresthesias, paresthesias of the face and arms that are painful, urge incontinence without bowel incontinence, and bilateral leg weakness left greater than right. Her workup included negative ANCA, negative SPEP, negative RPR, negative ESR and CRP, weakly positive ANA, normal TSH, and normal CSF cell count/differential, fungal cultures, IgG, oligoclonal bands, cytology, CSF Lyme PCR. 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 After admission, neurology was consulted and recommended MRI of the neural axis.  Interim summary 01/24/16--patient continues complaining of back pain and leg pain. Denies any fevers, chills, chest pain, shortness breath, vomiting, diarrhea, dysuria, hematuria. Overnight 2/18--2/19 the patient's Dilaudid PCA was discontinued, and the patient was started on Dilaudid 1 mg every 3 hours.  . 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  -plans noted for possible IVIG per neurology -B12 410 Chronic pain syndrome  -The patient has some opioid seeking behavior  -Please note previous history of opioid abuse  -Overnight 2/18--2/19 the patient's Dilaudid PCA was discontinued, and the patient was started on Dilaudid 1 mg every 3 hours.  -Patient has some nonphysiologic components of her physical examination--this has also been noted previously by Dr. Allena Katz  -I have told the patient that I will NOT increase the current hydromorphone dosing unless there is objective clinical evidence for worsening pain Iron deficiency anemia -Iron saturation 6%, ferritin 5  -FeraHeme 510mg  given x 1 -continue oral iron -Patient endorses compliance with oral iron Folate deficiency -Check RBC folate--pending Anxiety/depression -Continue Tegretol, Cymbalta Chronic migraine -Continue Topamax -Continue carbamazepine Transaminasemia -07/09/2015 MRCP--negative for choledocholithiasis;Revealed normal liver parenchyma  -Extensive workup including but not limited to autoimmune serology, hepatitis serologies and HIV are negative  -Most recent ANA on 12/12/2015-- 1:80    Family Communication: mother updated at beside 01/25/16--total time 35 min Disposition Plan: Home when Neurologic work up and intervention complete; anticipate > 3 days      Procedures/Studies: Dg Chest 2 View  12/28/2015  CLINICAL DATA:  History of lupus.  Seizure today. EXAM: CHEST  2 VIEW COMPARISON:  09/12/2014 FINDINGS: Cardiomediastinal silhouette is normal. Mediastinal contours appear intact. There is no evidence of focal airspace consolidation, pleural effusion or pneumothorax. Osseous structures are without acute abnormality. Soft tissues are grossly normal. IMPRESSION: No active cardiopulmonary disease. Electronically Signed   By:  Ted Mcalpine M.D.   On: 12/28/2015 17:01   Dg Cervical Spine Complete  12/30/2015  CLINICAL DATA:  Status post fall, with neck pain. Initial encounter. EXAM: CERVICAL SPINE - COMPLETE  4+ VIEW COMPARISON:  CT of the cervical spine performed 05/09/2015, and MRI of the cervical spine performed 11/27/2015 FINDINGS: There is no evidence of fracture or subluxation. Vertebral bodies demonstrate normal height and alignment. Intervertebral disc spaces are preserved. Prevertebral soft tissues are within normal limits. The provided odontoid view demonstrates no significant abnormality. The visualized lung apices are clear. IMPRESSION: No evidence of fracture or subluxation along the cervical spine. Electronically Signed   By: Roanna Raider M.D.   On: 12/30/2015 06:17   Dg Lumbar Spine Complete  12/30/2015  CLINICAL DATA:  Acute onset of lower back pain, status post fall. Initial encounter. EXAM: LUMBAR SPINE - COMPLETE 4+ VIEW COMPARISON:  MRI of the lumbar spine performed 11/26/2015 FINDINGS: There is no evidence of fracture or subluxation. Vertebral bodies demonstrate normal height and alignment. Intervertebral disc spaces are preserved. The visualized neural foramina are grossly unremarkable in appearance. The visualized bowel gas pattern is unremarkable in appearance; air and stool are noted within the colon. The sacroiliac joints are within normal limits. IMPRESSION: No evidence of fracture or subluxation along the lumbar spine. Electronically Signed   By: Roanna Raider M.D.   On: 12/30/2015 06:18   Dg Pelvis 1-2 Views  12/30/2015  CLINICAL DATA:  Status post seizure and fall, with bilateral hip pain. Initial encounter. EXAM: PELVIS - 1-2 VIEW COMPARISON:  CT of the abdomen and pelvis from 06/11/2015 FINDINGS: There is no evidence of fracture or dislocation. Both femoral heads are seated normally within their respective acetabula. No significant degenerative change is appreciated. The sacroiliac joints  are unremarkable in appearance. The visualized bowel gas pattern is grossly unremarkable in appearance. IMPRESSION: No evidence of fracture or dislocation. Electronically Signed   By: Roanna Raider M.D.   On: 12/30/2015 06:16   Mr Laqueta Jean GU Contrast  01/24/2016  CLINICAL DATA:  Progressive lower extremity numbness, severe neuropathy, subsequent worsening. Possible autoimmune disease. EXAM: MRI HEAD WITHOUT AND WITH CONTRAST TECHNIQUE: Multiplanar, multiecho pulse sequences of the brain and surrounding structures were obtained without and with intravenous contrast. CONTRAST:  20mL MULTIHANCE GADOBENATE DIMEGLUMINE 529 MG/ML IV SOLN COMPARISON:  MRI of the head November 27, 2015 FINDINGS: The ventricles and sulci are normal for patient's age. No abnormal parenchymal signal, mass lesions, mass effect. No abnormal parenchymal enhancement. No reduced diffusion to suggest acute ischemia. No susceptibility artifact to suggest hemorrhage. No abnormal extra-axial fluid collections. No extra-axial masses nor leptomeningeal enhancement. Normal major intracranial vascular flow voids seen at the skull base. 5 mm pineal cyst. Ocular globes and orbital contents are unremarkable though not tailored for evaluation. No suspicious calvarial bone marrow signal. No abnormal sellar expansion. Craniocervical junction maintained. Mild paranasal sinus mucosal thickening. Mastoid air cells are well aerated. IMPRESSION: 6 mm pineal cyst, otherwise unremarkable MRI of the brain with and without contrast. Electronically Signed   By: Awilda Metro M.D.   On: 01/24/2016 02:38   Mr Cervical Spine W Wo Contrast  01/24/2016  CLINICAL DATA:  Progressive lower extremity numbness, severe neuropathy, subsequent worsening. Possible autoimmune disease. EXAM: MRI CERVICAL SPINE WITHOUT AND WITH CONTRAST TECHNIQUE: Multiplanar and multiecho pulse sequences of the cervical spine, to include the craniocervical junction and cervicothoracic  junction, were obtained according to standard protocol without and with intravenous contrast. CONTRAST:  20 cc MultiHance COMPARISON:  MRI of the cervical spine November 27, 2015 FINDINGS: Mildly motion degraded examination. Cervical vertebral bodies and posterior elements are intact and aligned and maintenance of cervical lordosis. Intervertebral discs  demonstrate normal morphology and signal characteristics. No bone marrow signal to suggest acute osseous process. No abnormal osseous or intradiscal enhancement. Cervical spinal cord appears normal morphology and signal characteristics though, mild patient motion decreases sensitivity for subtle cord signal abnormality. No abnormal cord, leptomeningeal or epidural enhancement. Normal included prevertebral and paraspinal soft tissues. C2-3, C3-4: No disc bulge, canal stenosis nor neural foraminal narrowing. C4-5: Stable small central disc protrusion, no canal stenosis or neural foraminal narrowing. C6-7 through C7-T1: No disc bulge, canal stenosis nor neural foraminal narrowing. IMPRESSION: Mildly motion degraded examination. Stable small central disc protrusion at C4-5, otherwise unremarkable MRI of the cervical spine with and without contrast. Electronically Signed   By: Awilda Metro M.D.   On: 01/24/2016 02:58   Mr Thoracic Spine W Wo Contrast  01/24/2016  CLINICAL DATA:  Progressive lower extremity numbness, severe neuropathy, subsequent worsening. Possible autoimmune disease. EXAM: MRI THORACIC AND LUMBAR SPINE WITHOUT AND WITH CONTRAST TECHNIQUE: Multiplanar and multiecho pulse sequences of the thoracic and lumbar spine were obtained without and with intravenous contrast. CONTRAST:  20 cc MultiHance COMPARISON:  MRI of the lumbar spine November 26, 2015 FINDINGS: MR THORACIC SPINE FINDINGS Mild motion degraded examination. Mild T8 chronic compression fracture less than 20% height loss. The remaining thoracic vertebral bodies intact and aligned.  Maintenance of thoracic kyphosis. Intervertebral discs demonstrate normal morphology. No was bone marrow signal to suggest acute osseous process. No abnormal osseous or intradiscal enhancement. Bright T1 and bright T2 small hemangiomas T6 and T7. Thoracic spinal cord is normal morphology and signal characteristics the level the conus medullaris which terminates at L1. No abnormal cord, leptomeningeal or epidural enhancement. Included prevertebral soft tissues are normal. Dependent atelectasis in the lung bases. No disc bulge, canal stenosis or neural foraminal narrowing at any level. MR LUMBAR SPINE FINDINGS Lumbar vertebral bodies and posterior elements are intact and aligned with maintenance of lumbar lordosis. Mild L3-4 and L4-5 disc height loss is similar to prior examination with decreased T2 signal within these 2 discs consistent with mild desiccation. No bone marrow signal abnormality to suggest acute osseous process. No abnormal osseous or intradiscal enhancement. Conus medullaris terminates at L1 and appears normal morphology and signal characteristics. Cauda equina is normal. No abnormal cord, leptomeningeal or epidural enhancement. Included prevertebral and paraspinal soft tissues are normal. Level by level evaluation: L1-2, L2-3: No disc bulge, canal stenosis nor neural foraminal narrowing. L3-4: Small RIGHT central disc protrusion and enhancing annular fissure is similar to prior examination. No canal stenosis or neural foraminal narrowing. L4-5: Similar RIGHT central 3 mm disc extrusion, 11 mm contiguous superior and inferior migration, similar to prior examination. No canal stenosis or neural foraminal narrowing. L5-S1: No disc bulge, canal stenosis nor neural foraminal narrowing. IMPRESSION: MRI THORACIC SPINE:  Mild motion degraded examination. Old mild T8 compression fracture with otherwise unremarkable MRI of the thoracic spine with and without contrast. MRI LUMBAR SPINE: Stable small L3-4 disc  protrusion and small L4-5 contiguous disc extrusion. Otherwise unremarkable MRI of the lumbar spine without contrast. Electronically Signed   By: Awilda Metro M.D.   On: 01/24/2016 03:19   Mr Lumbar Spine W Wo Contrast  01/24/2016  CLINICAL DATA:  Progressive lower extremity numbness, severe neuropathy, subsequent worsening. Possible autoimmune disease. EXAM: MRI THORACIC AND LUMBAR SPINE WITHOUT AND WITH CONTRAST TECHNIQUE: Multiplanar and multiecho pulse sequences of the thoracic and lumbar spine were obtained without and with intravenous contrast. CONTRAST:  20 cc MultiHance COMPARISON:  MRI of the  lumbar spine November 26, 2015 FINDINGS: MR THORACIC SPINE FINDINGS Mild motion degraded examination. Mild T8 chronic compression fracture less than 20% height loss. The remaining thoracic vertebral bodies intact and aligned. Maintenance of thoracic kyphosis. Intervertebral discs demonstrate normal morphology. No was bone marrow signal to suggest acute osseous process. No abnormal osseous or intradiscal enhancement. Bright T1 and bright T2 small hemangiomas T6 and T7. Thoracic spinal cord is normal morphology and signal characteristics the level the conus medullaris which terminates at L1. No abnormal cord, leptomeningeal or epidural enhancement. Included prevertebral soft tissues are normal. Dependent atelectasis in the lung bases. No disc bulge, canal stenosis or neural foraminal narrowing at any level. MR LUMBAR SPINE FINDINGS Lumbar vertebral bodies and posterior elements are intact and aligned with maintenance of lumbar lordosis. Mild L3-4 and L4-5 disc height loss is similar to prior examination with decreased T2 signal within these 2 discs consistent with mild desiccation. No bone marrow signal abnormality to suggest acute osseous process. No abnormal osseous or intradiscal enhancement. Conus medullaris terminates at L1 and appears normal morphology and signal characteristics. Cauda equina is normal. No  abnormal cord, leptomeningeal or epidural enhancement. Included prevertebral and paraspinal soft tissues are normal. Level by level evaluation: L1-2, L2-3: No disc bulge, canal stenosis nor neural foraminal narrowing. L3-4: Small RIGHT central disc protrusion and enhancing annular fissure is similar to prior examination. No canal stenosis or neural foraminal narrowing. L4-5: Similar RIGHT central 3 mm disc extrusion, 11 mm contiguous superior and inferior migration, similar to prior examination. No canal stenosis or neural foraminal narrowing. L5-S1: No disc bulge, canal stenosis nor neural foraminal narrowing. IMPRESSION: MRI THORACIC SPINE:  Mild motion degraded examination. Old mild T8 compression fracture with otherwise unremarkable MRI of the thoracic spine with and without contrast. MRI LUMBAR SPINE: Stable small L3-4 disc protrusion and small L4-5 contiguous disc extrusion. Otherwise unremarkable MRI of the lumbar spine without contrast. Electronically Signed   By: Awilda Metro M.D.   On: 01/24/2016 03:19            Objective: Filed Vitals:   01/24/16 2135 01/25/16 0139 01/25/16 1034 01/25/16 1444  BP: 115/60 116/74 111/63 112/60  Pulse: 69 65 71 72  Temp: 98.1 F (36.7 C) 98.4 F (36.9 C) 98.9 F (37.2 C) 99 F (37.2 C)  TempSrc: Oral Oral Oral Oral  Resp: 20 18 20 20   Height:      Weight:      SpO2: 95% 96% 94% 94%   No intake or output data in the 24 hours ending 01/25/16 1753 Weight change:  Exam:   General:  Pt is alert, follows commands appropriately, not in acute distress  HEENT: No icterus, No thrush, No neck mass, [place]/AT  Cardiovascular: RRR, S1/S2, no rubs, no gallops  Respiratory: CTA bilaterally, no wheezing, no crackles, no rhonchi  Abdomen: Soft/+BS, non tender, non distended, no guarding; no hepatosplenomegaly   Extremities: No edema, No lymphangitis, No petechiae, No rashes, no synovitis  Data Reviewed: Basic Metabolic Panel:  Recent Labs Lab  01/23/16 1816  NA 140  K 3.9  CL 107  CO2 20*  GLUCOSE 91  BUN 11  CREATININE 0.60  CALCIUM 9.6   Liver Function Tests:  Recent Labs Lab 01/23/16 1816  AST 68*  ALT 93*  ALKPHOS 112  BILITOT 0.3  PROT 7.4  ALBUMIN 4.4   No results for input(s): LIPASE, AMYLASE in the last 168 hours. No results for input(s): AMMONIA in the last 168 hours.  CBC:  Recent Labs Lab 01/23/16 1816  WBC 5.6  NEUTROABS 2.6  HGB 10.0*  HCT 32.8*  MCV 82.6  PLT 349   Cardiac Enzymes: No results for input(s): CKTOTAL, CKMB, CKMBINDEX, TROPONINI in the last 168 hours. BNP: Invalid input(s): POCBNP CBG: No results for input(s): GLUCAP in the last 168 hours.  No results found for this or any previous visit (from the past 240 hour(s)).   Scheduled Meds: . baclofen  5 mg Oral QHS  . carbamazepine  400 mg Oral QHS  . docusate sodium  100 mg Oral Daily  . DULoxetine  60 mg Oral Daily  . enoxaparin (LOVENOX) injection  40 mg Subcutaneous Q24H  . ferrous sulfate  325 mg Oral BID WC  . folic acid  1 mg Oral Daily  . ketorolac  15 mg Intravenous Once  . topiramate  100 mg Oral QHS  . topiramate  50 mg Oral Daily   Continuous Infusions:    Tabbitha Janvrin, DO  [facility] Pager [phone]  If 7PM-7AM, please contact night-coverage [email or web address] Password [email or web address] 01/25/2016, 5:53 PM   LOS: 2 days

## 2016-01-25 NOTE — Progress Notes (Signed)
Interval History:                                                                                                                      Penny [name] is an 30 y.o. female patient who presented with subjective symptoms of leg weakness / numbness. Has a history of chronic opioid use for musculoskeletal pain symptoms.  As described in neurology consultation note, she had negative MRI's of neuro axis. But reportedly her EMG/NCS done by Dr. Posey Pronto were abnormal. She doesn't carry a definite diagnosis for her symptoms or NCS abnormalities.  She presented to ER apparently sent by Dr. Posey Pronto , per patient and her family for further treatment with IVIG. Dr. Posey Pronto has not directly communicated any such plan or discussed her diagnosis and indication for IVIG directly with me or covering neurologists.     Past Medical History: Past Medical History  Diagnosis Date  . Migraines   . Interstitial cystitis   . IUD FEB 2010  . Gastritis JULY 2011  . Depression   . 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 Fatty liver   . Obesity (BMI 30-39.9) 2011 228 LBS BMI 36.8  . Iron deficiency anemia 07/23/2010  . Irritable bowel syndrome 2012 DIARRHEA    JUN 2012 TTG IgA 14.9  . Elevated liver enzymes JUL 2011ALK PHOS 111-127 AST  143-267 ALT  213-321T BILI 0.6  ALB  3.7-4.06 Jun 2011 ALK PHOS 118 AST 24 ALT 42 T BILI 0.4 ALB 3.9  . Chronic daily headache   . Ovarian cyst   . ADD (attention deficit disorder)   . RLQ abdominal pain 07/18/2013  . Menorrhagia 07/18/2013  . Anxiety   . Potassium (K) deficiency   . Dysmenorrhea 09/18/2013  . Stress 09/18/2013  . Patient desires pregnancy 09/18/2013  . Pregnant 12/25/2013  . Blood transfusion without reported diagnosis   . Heart murmur   . Gastric bypass status for obesity   . 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 Dysrhythmia   . Sciatica of left side 11/14/2014  . Fibromyalgia   . 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 PONV  (postoperative nausea and vomiting) seizure post-operatively  . Psychiatric pseudoseizure   . Depression   . Lupus [facility])     Past Surgical History  Procedure Laterality Date  . Gab  2007    in [place]-POUCH 5 CM  . Cholecystectomy  2005    biliary dyskinesia  . Cath self every nite      for sodium bicarb injection (discontinued 2013)  . Colonoscopy  JUN 2012 ABD PN/DIARRHEA WITH PROPOFOL    NL COLON  . Upper gastrointestinal endoscopy  JULY 2011 NAUSEA-D125,V6, PH 25    Bx; GASTRITIS, POUCH-5 CM LONG  . Dilation and curettage of uterus    . Tonsillectomy    . Gastric bypass  06/2006  . Savory dilation  06/20/2012    Dr. Barnie Alderman gastritis/Ulcer  in the mid jejunum. Empiric dilation.   . Tonsillectomy and adenoidectomy    . Esophagogastroduodenoscopy    . Wisdom tooth extraction    . Repair vaginal cuff N/A 07/30/2014    Procedure: REPAIR VAGINAL CUFF;  Surgeon: Mora Bellman, MD;  Location: [facility];  Service: Gynecology;  Laterality: N/A;  . Hysteroscopy w/d&c N/A 09/12/2014    Procedure: DILATATION AND CURETTAGE /HYSTEROSCOPY;  Surgeon: Jonnie Kind, MD;  Location: [facility];  Service: Gynecology;  Laterality: N/A;  . Esophagogastroduodenoscopy (egd) with propofol N/A 04/10/2015    Procedure: ESOPHAGOGASTRODUODENOSCOPY (EGD) WITH PROPOFOL;  Surgeon: Daneil Dolin, MD;  Location: [facility];  Service: Endoscopy;  Laterality: N/A;  . Esophageal dilation N/A 04/10/2015    Procedure: ESOPHAGEAL DILATION WITH 54FR MALONEY DILATOR;  Surgeon: Daneil Dolin, MD;  Location: [facility];  Service: Endoscopy;  Laterality: N/A;  . Biopsy  04/10/2015    Procedure: BIOPSY;  Surgeon: Daneil Dolin, MD;  Location: [facility];  Service: Endoscopy;;    Family History: Family History  Problem Relation Age of Onset  . Hemochromatosis Maternal Grandmother   . Migraines Maternal Grandmother   . Cancer Maternal Grandmother   . Breast cancer Maternal Grandmother   . Hypertension Father   . Diabetes Father    . Coronary artery disease Father   . Migraines Paternal Grandmother   . Breast cancer Paternal Grandmother   . Cancer Mother     breast  . Hemochromatosis Mother   . Breast cancer Mother   . Depression Mother   . Anxiety disorder Mother   . Coronary artery disease Paternal Grandfather   . Anxiety disorder Brother   . Bipolar disorder Brother   . Healthy Son   . Healthy Daughter     Social History:   reports that she quit smoking about 8 weeks ago. Her smoking use included Cigarettes. She has a 1.5 pack-year smoking history. She has never used smokeless tobacco. She reports that she drinks alcohol. She reports that she does not use illicit drugs.  Allergies:  Allergies  Allergen Reactions  . Gabapentin Other (See Comments)    Pt states that she was unresponsive after taking this medication, but her vitals remained stable.    . Metoclopramide Hcl Anxiety and Other (See Comments)    Pt states that she felt like she was trapped in a box, and could not get out.  Pt also states that she had temporary loss of movement, weakness, and tingling.    . Morphine And Related Other (See Comments)    Chest pain   . Tramadol Other (See Comments)    Seizures  . Zofran [Ondansetron] Other (See Comments)    Migraines   . Latex Rash  . Tape Itching and Rash    Please use paper tape     Medications:  Current facility-administered medications:  .  acetaminophen (TYLENOL) tablet 650 mg, 650 mg, Oral, Q6H PRN, Orson Eva, MD, 650 mg at 01/25/16 0649 .  baclofen (LIORESAL) tablet 5 mg, 5 mg, Oral, QHS,  Fuller Mandril, MD, 5 mg at 01/25/16 2014 .  carbamazepine (TEGRETOL) tablet 400 mg, 400 mg, Oral, QHS, Edwin Dada, MD, 400 mg at 01/24/16 2131 .  clonazePAM (KLONOPIN) tablet 0.5 mg, 0.5 mg, Oral, TID PRN, Orson Eva, MD, 0.5 mg at 01/24/16 2131 .   docusate sodium (COLACE) capsule 100 mg, 100 mg, Oral, Daily, Edwin Dada, MD, 100 mg at 01/25/16 0915 .  DULoxetine (CYMBALTA) DR capsule 60 mg, 60 mg, Oral, Daily, Edwin Dada, MD, 60 mg at 01/25/16 0914 .  enoxaparin (LOVENOX) injection 40 mg, 40 mg, Subcutaneous, Q24H, Edwin Dada, MD, 40 mg at 01/25/16 0914 .  ferrous sulfate tablet 325 mg, 325 mg, Oral, BID WC, Edwin Dada, MD, 325 mg at 01/25/16 1706 .  folic acid (FOLVITE) tablet 1 mg, 1 mg, Oral, Daily, Edwin Dada, MD, 1 mg at 01/25/16 0914 .  HYDROmorphone (DILAUDID) injection 1 mg, 1 mg, Intravenous, Q3H PRN, Willia Craze, NP, 1 mg at 01/25/16 1711 .  ketorolac (TORADOL) 15 MG/ML injection 15 mg, 15 mg, Intravenous, Once, Virgel Manifold, MD, 15 mg at 01/23/16 1950 .  ondansetron (ZOFRAN) injection 4 mg, 4 mg, Intravenous, Q6H PRN, Orson Eva, MD, 4 mg at 01/25/16 1305 .  oxyCODONE (OXYCONTIN) 12 hr tablet 20 mg, 20 mg, Oral, Q12H, Orson Eva, MD, 20 mg at 01/25/16 1846 .  polyethylene glycol (MIRALAX / GLYCOLAX) packet 17 g, 17 g, Oral, Daily PRN, Edwin Dada, MD, 17 g at 01/25/16 0915 .  promethazine (PHENERGAN) tablet 12.5 mg, 12.5 mg, Oral, Q6H PRN, Edwin Dada, MD, 12.5 mg at 01/25/16 0914 .  senna (SENOKOT) tablet 17.2 mg, 2 tablet, Oral, Daily, Orson Eva, MD, 17.2 mg at 01/25/16 1832 .  tiZANidine (ZANAFLEX) tablet 4 mg, 4 mg, Oral, TID PRN, Edwin Dada, MD, 4 mg at 01/25/16 0913 .  topiramate (TOPAMAX) tablet 100 mg, 100 mg, Oral, QHS, Edwin Dada, MD, 100 mg at 01/24/16 2130 .  topiramate (TOPAMAX) tablet 50 mg, 50 mg, Oral, Daily, Edwin Dada, MD, 50 mg at 01/25/16 0914 .  traZODone (DESYREL) tablet 150 mg, 150 mg, Oral, QHS PRN, Edwin Dada, MD   Neurologic Examination:                                                                                                      Blood pressure 112/65, pulse 70, temperature  98.9 F (37.2 C), temperature source Oral, resp. rate 20, height 5' 7" (1.702 m), weight 90.266 kg (199 lb), last menstrual period 12/21/2015, SpO2 98 %, not currently breastfeeding.  Evaluation of higher integrative functions including: Level of alertness: Alert, appears in distress from pain Oriented to time, place and person Speech: fluent, no evidence of dysarthria or aphasia noted.  Test the following cranial nerves: 2-12 grossly intact Motor examination: Normal tone,  bulk, demonstrated full strength in b/l upper extremities.  she initially refused to move her both legs citing weakness. With repeated encouragement, and passively helping to elevate antigravity, she eventually was able to demonstrated full 5/5 strength in  B/l hip flexors, quads, hamstrings, ankle dorsi flexion and planar flexion. No definite evidence for pathological weakness noted at that point.   Examination of sensation : subjective reduced sensation to pinprick in legs, reportedly worse on left. Unreliable.  Examination of deep tendon reflexes: 2+, normal and symmetric in all extremities, normal plantars bilaterally Test coordination: Normal finger nose testing, with no evidence of limb appendicular ataxia or abnormal involuntary movements or tremors noted.  Gait: Deferred   Lab Results: Basic Metabolic Panel:  Recent Labs Lab 01/23/16 1816  NA 140  K 3.9  CL 107  CO2 20*  GLUCOSE 91  BUN 11  CREATININE 0.60  CALCIUM 9.6    Liver Function Tests:  Recent Labs Lab 01/23/16 1816  AST 68*  ALT 93*  ALKPHOS 112  BILITOT 0.3  PROT 7.4  ALBUMIN 4.4   No results for input(s): LIPASE, AMYLASE in the last 168 hours. No results for input(s): AMMONIA in the last 168 hours.  CBC:  Recent Labs Lab 01/23/16 1816  WBC 5.6  NEUTROABS 2.6  HGB 10.0*  HCT 32.8*  MCV 82.6  PLT 349    Cardiac Enzymes: No results for input(s): CKTOTAL, CKMB, CKMBINDEX, TROPONINI in the last 168 hours.  Lipid Panel: No  results for input(s): CHOL, TRIG, HDL, CHOLHDL, VLDL, LDLCALC in the last 168 hours.  CBG: No results for input(s): GLUCAP in the last 168 hours.  Microbiology: Results for orders placed or performed during the hospital encounter of 08/17/15  Urine culture     Status: None   Collection Time: 08/17/15  2:09 PM  Result Value Ref Range Status   Specimen Description URINE, CLEAN CATCH  Final   Special Requests Normal  Final   Culture   Final    >=100,000 COLONIES/mL ESCHERICHIA COLI Performed at [facility]    Report Status 08/19/2015 FINAL  Final   Organism ID, Bacteria ESCHERICHIA COLI  Final      Susceptibility   Escherichia coli - MIC*    AMPICILLIN >=32 RESISTANT Resistant     CEFAZOLIN 16 SENSITIVE Sensitive     CEFTRIAXONE <=1 SENSITIVE Sensitive     CIPROFLOXACIN <=0.25 SENSITIVE Sensitive     GENTAMICIN <=1 SENSITIVE Sensitive     IMIPENEM <=0.25 SENSITIVE Sensitive     NITROFURANTOIN <=16 SENSITIVE Sensitive     TRIMETH/SULFA <=20 SENSITIVE Sensitive     AMPICILLIN/SULBACTAM >=32 RESISTANT Resistant     PIP/TAZO <=4 SENSITIVE Sensitive     * >=100,000 COLONIES/mL ESCHERICHIA COLI    Imaging: Mr Jeri Cos Wo Contrast  01/24/2016  CLINICAL DATA:  Progressive lower extremity numbness, severe neuropathy, subsequent worsening. Possible autoimmune disease. EXAM: MRI HEAD WITHOUT AND WITH CONTRAST TECHNIQUE: Multiplanar, multiecho pulse sequences of the brain and surrounding structures were obtained without and with intravenous contrast. CONTRAST:  72m MULTIHANCE GADOBENATE DIMEGLUMINE 529 MG/ML IV SOLN COMPARISON:  MRI of the head November 27, 2015 FINDINGS: The ventricles and sulci are normal for patient's age. No abnormal parenchymal signal, mass lesions, mass effect. No abnormal parenchymal enhancement. No reduced diffusion to suggest acute ischemia. No susceptibility artifact to suggest hemorrhage. No abnormal extra-axial fluid collections. No extra-axial masses nor  leptomeningeal enhancement. Normal major intracranial vascular flow voids seen at the skull base. 5  mm pineal cyst. Ocular globes and orbital contents are unremarkable though not tailored for evaluation. No suspicious calvarial bone marrow signal. No abnormal sellar expansion. Craniocervical junction maintained. Mild paranasal sinus mucosal thickening. Mastoid air cells are well aerated. IMPRESSION: 6 mm pineal cyst, otherwise unremarkable MRI of the brain with and without contrast. Electronically Signed   By: Elon Alas M.D.   On: 01/24/2016 02:38   Mr Cervical Spine W Wo Contrast  01/24/2016  CLINICAL DATA:  Progressive lower extremity numbness, severe neuropathy, subsequent worsening. Possible autoimmune disease. EXAM: MRI CERVICAL SPINE WITHOUT AND WITH CONTRAST TECHNIQUE: Multiplanar and multiecho pulse sequences of the cervical spine, to include the craniocervical junction and cervicothoracic junction, were obtained according to standard protocol without and with intravenous contrast. CONTRAST:  20 cc MultiHance COMPARISON:  MRI of the cervical spine November 27, 2015 FINDINGS: Mildly motion degraded examination. Cervical vertebral bodies and posterior elements are intact and aligned and maintenance of cervical lordosis. Intervertebral discs demonstrate normal morphology and signal characteristics. No bone marrow signal to suggest acute osseous process. No abnormal osseous or intradiscal enhancement. Cervical spinal cord appears normal morphology and signal characteristics though, mild patient motion decreases sensitivity for subtle cord signal abnormality. No abnormal cord, leptomeningeal or epidural enhancement. Normal included prevertebral and paraspinal soft tissues. C2-3, C3-4: No disc bulge, canal stenosis nor neural foraminal narrowing. C4-5: Stable small central disc protrusion, no canal stenosis or neural foraminal narrowing. C6-7 through C7-T1: No disc bulge, canal stenosis nor neural  foraminal narrowing. IMPRESSION: Mildly motion degraded examination. Stable small central disc protrusion at C4-5, otherwise unremarkable MRI of the cervical spine with and without contrast. Electronically Signed   By: Elon Alas M.D.   On: 01/24/2016 02:58   Mr Thoracic Spine W Wo Contrast  01/24/2016  CLINICAL DATA:  Progressive lower extremity numbness, severe neuropathy, subsequent worsening. Possible autoimmune disease. EXAM: MRI THORACIC AND LUMBAR SPINE WITHOUT AND WITH CONTRAST TECHNIQUE: Multiplanar and multiecho pulse sequences of the thoracic and lumbar spine were obtained without and with intravenous contrast. CONTRAST:  20 cc MultiHance COMPARISON:  MRI of the lumbar spine November 26, 2015 FINDINGS: MR THORACIC SPINE FINDINGS Mild motion degraded examination. Mild T8 chronic compression fracture less than 20% height loss. The remaining thoracic vertebral bodies intact and aligned. Maintenance of thoracic kyphosis. Intervertebral discs demonstrate normal morphology. No was bone marrow signal to suggest acute osseous process. No abnormal osseous or intradiscal enhancement. Bright T1 and bright T2 small hemangiomas T6 and T7. Thoracic spinal cord is normal morphology and signal characteristics the level the conus medullaris which terminates at L1. No abnormal cord, leptomeningeal or epidural enhancement. Included prevertebral soft tissues are normal. Dependent atelectasis in the lung bases. No disc bulge, canal stenosis or neural foraminal narrowing at any level. MR LUMBAR SPINE FINDINGS Lumbar vertebral bodies and posterior elements are intact and aligned with maintenance of lumbar lordosis. Mild L3-4 and L4-5 disc height loss is similar to prior examination with decreased T2 signal within these 2 discs consistent with mild desiccation. No bone marrow signal abnormality to suggest acute osseous process. No abnormal osseous or intradiscal enhancement. Conus medullaris terminates at L1 and  appears normal morphology and signal characteristics. Cauda equina is normal. No abnormal cord, leptomeningeal or epidural enhancement. Included prevertebral and paraspinal soft tissues are normal. Level by level evaluation: L1-2, L2-3: No disc bulge, canal stenosis nor neural foraminal narrowing. L3-4: Small RIGHT central disc protrusion and enhancing annular fissure is similar to prior examination. No canal stenosis  or neural foraminal narrowing. L4-5: Similar RIGHT central 3 mm disc extrusion, 11 mm contiguous superior and inferior migration, similar to prior examination. No canal stenosis or neural foraminal narrowing. L5-S1: No disc bulge, canal stenosis nor neural foraminal narrowing. IMPRESSION: MRI THORACIC SPINE:  Mild motion degraded examination. Old mild T8 compression fracture with otherwise unremarkable MRI of the thoracic spine with and without contrast. MRI LUMBAR SPINE: Stable small L3-4 disc protrusion and small L4-5 contiguous disc extrusion. Otherwise unremarkable MRI of the lumbar spine without contrast. Electronically Signed   By: Elon Alas M.D.   On: 01/24/2016 03:19   Mr Lumbar Spine W Wo Contrast  01/24/2016  CLINICAL DATA:  Progressive lower extremity numbness, severe neuropathy, subsequent worsening. Possible autoimmune disease. EXAM: MRI THORACIC AND LUMBAR SPINE WITHOUT AND WITH CONTRAST TECHNIQUE: Multiplanar and multiecho pulse sequences of the thoracic and lumbar spine were obtained without and with intravenous contrast. CONTRAST:  20 cc MultiHance COMPARISON:  MRI of the lumbar spine November 26, 2015 FINDINGS: MR THORACIC SPINE FINDINGS Mild motion degraded examination. Mild T8 chronic compression fracture less than 20% height loss. The remaining thoracic vertebral bodies intact and aligned. Maintenance of thoracic kyphosis. Intervertebral discs demonstrate normal morphology. No was bone marrow signal to suggest acute osseous process. No abnormal osseous or intradiscal  enhancement. Bright T1 and bright T2 small hemangiomas T6 and T7. Thoracic spinal cord is normal morphology and signal characteristics the level the conus medullaris which terminates at L1. No abnormal cord, leptomeningeal or epidural enhancement. Included prevertebral soft tissues are normal. Dependent atelectasis in the lung bases. No disc bulge, canal stenosis or neural foraminal narrowing at any level. MR LUMBAR SPINE FINDINGS Lumbar vertebral bodies and posterior elements are intact and aligned with maintenance of lumbar lordosis. Mild L3-4 and L4-5 disc height loss is similar to prior examination with decreased T2 signal within these 2 discs consistent with mild desiccation. No bone marrow signal abnormality to suggest acute osseous process. No abnormal osseous or intradiscal enhancement. Conus medullaris terminates at L1 and appears normal morphology and signal characteristics. Cauda equina is normal. No abnormal cord, leptomeningeal or epidural enhancement. Included prevertebral and paraspinal soft tissues are normal. Level by level evaluation: L1-2, L2-3: No disc bulge, canal stenosis nor neural foraminal narrowing. L3-4: Small RIGHT central disc protrusion and enhancing annular fissure is similar to prior examination. No canal stenosis or neural foraminal narrowing. L4-5: Similar RIGHT central 3 mm disc extrusion, 11 mm contiguous superior and inferior migration, similar to prior examination. No canal stenosis or neural foraminal narrowing. L5-S1: No disc bulge, canal stenosis nor neural foraminal narrowing. IMPRESSION: MRI THORACIC SPINE:  Mild motion degraded examination. Old mild T8 compression fracture with otherwise unremarkable MRI of the thoracic spine with and without contrast. MRI LUMBAR SPINE: Stable small L3-4 disc protrusion and small L4-5 contiguous disc extrusion. Otherwise unremarkable MRI of the lumbar spine without contrast. Electronically Signed   By: Elon Alas M.D.   On:  01/24/2016 03:19    Assessment and plan:   LANICE FOLDEN is an 30 y.o. female patient who is admitted with leg weakness symptoms going on for few years, negative mri's of neuroaxis, and some reported abnormalities on EMG/NCS which I am not sure are consistent across repeated studies. Clonically, although she claimed weakness in both legs, with repeated encouragement, able to demonstrate full 5/5 strength in both legs across all muscle groups as described above in exam. Her parents who witnessed the examination were infact surprised that she  was able to demonstrate full strength.  With lack a clear diagnosis or indication, I explained to them that I am not comfortable to start IVIG or any other immunosuppressant therapy such as iv steroids at this time. I would defer any such empirical treatment decision to Dr. Posey Pronto who has been following the patient as out patient. Offered to start baclofen 5 mg QHS which could help with patient's chronic back spasms. She agreed to start that mediation.  PT/OT as tolerated.

## 2016-01-26 ENCOUNTER — Telehealth: Payer: Self-pay | Admitting: Neurology

## 2016-01-26 DIAGNOSIS — R202 Paresthesia of skin: Secondary | ICD-10-CM | POA: Insufficient documentation

## 2016-01-26 DIAGNOSIS — N3010 Interstitial cystitis (chronic) without hematuria: Secondary | ICD-10-CM

## 2016-01-26 LAB — CBC
HEMATOCRIT: 29.1 % — AB (ref 36.0–46.0)
Hemoglobin: 8.9 g/dL — ABNORMAL LOW (ref 12.0–15.0)
MCH: 25.2 pg — ABNORMAL LOW (ref 26.0–34.0)
MCHC: 30.6 g/dL (ref 30.0–36.0)
MCV: 82.4 fL (ref 78.0–100.0)
PLATELETS: 260 10*3/uL (ref 150–400)
RBC: 3.53 MIL/uL — AB (ref 3.87–5.11)
RDW: 14.3 % (ref 11.5–15.5)
WBC: 4.6 10*3/uL (ref 4.0–10.5)

## 2016-01-26 LAB — FOLATE RBC
FOLATE, RBC: 886 ng/mL (ref >498)
Folate, Hemolysate: 261.4 ng/mL
Hematocrit: 29.5 % — ABNORMAL LOW (ref 34.0–46.6)

## 2016-01-26 LAB — COMPREHENSIVE METABOLIC PANEL WITH GFR
ALT: 75 U/L — ABNORMAL HIGH (ref 14–54)
AST: 48 U/L — ABNORMAL HIGH (ref 15–41)
Albumin: 3.5 g/dL (ref 3.5–5.0)
Alkaline Phosphatase: 101 U/L (ref 38–126)
Anion gap: 8 (ref 5–15)
BUN: 8 mg/dL (ref 6–20)
CO2: 24 mmol/L (ref 22–32)
Calcium: 9.2 mg/dL (ref 8.9–10.3)
Chloride: 108 mmol/L (ref 101–111)
Creatinine, Ser: 0.63 mg/dL (ref 0.44–1.00)
GFR calc Af Amer: >60 mL/min (ref >60)
GFR calc non Af Amer: >60 mL/min (ref >60)
Glucose, Bld: 97 mg/dL (ref 65–99)
Potassium: 4 mmol/L (ref 3.5–5.1)
Sodium: 140 mmol/L (ref 135–145)
Total Bilirubin: 0.4 mg/dL (ref 0.3–1.2)
Total Protein: 6.1 g/dL — ABNORMAL LOW (ref 6.5–8.1)

## 2016-01-26 LAB — MAGNESIUM: Magnesium: 2.2 mg/dL (ref 1.7–2.4)

## 2016-01-26 NOTE — Telephone Encounter (Signed)
PT's mother Santiago Glad called and said she has not received a call back yet/Dawn CB# [phone]

## 2016-01-26 NOTE — Telephone Encounter (Signed)
Pt wants to talk to someone asap she is up for discharge and does not feel safe going home due to legs being numb please call [phone]

## 2016-01-26 NOTE — Telephone Encounter (Signed)
Dr. Posey Pronto spoke with Dr. Shanon Brow Tat.

## 2016-01-26 NOTE — Telephone Encounter (Signed)
Called patient back and informed her that our office does not make inpatient decisions.  Instructed her to raise any concerns with her attending doctor.

## 2016-01-26 NOTE — Progress Notes (Addendum)
RN offered pain medication at this time but patient appears relaxed with c/o soreness in her legs. Pain medication returned to pyxis. Patient's mother at bedside voices that pt hasn't BM x 3 days and does feel discomfort on her abdomen area. Bowel Sound active. RN offered suppository but patient decline. Patient stated "I usually take 2 stool softener at home with my pain medication" RN reviewed side effects of narcotics once again. Miralax PRN administered. MD to call patient mother with plan of care per request.   Ave Filter, RN

## 2016-01-26 NOTE — Care Management Note (Signed)
Case Management Note  Patient Details  Name: Penny [name] MRN: [id number] Date of Birth: [birth year]  Subjective/Objective:                    Action/Plan: Patient discharging home today with [facility] services. CM met with the patient and provided her a list of agencies in the [place] area. She selected [place]. Tiffany with [facility] notified and accepted the referral, however pt only able to receive [facility] RN d/t she does not have a qualifying diagnosis for PT/OT with her Medicaid. Patient informed per CM. Will update bedside RN.   Expected Discharge Date:                  Expected Discharge Plan:  [place]  In-House Referral:     Discharge planning Services  CM Consult  Post Acute Care Choice:  Home Health Choice offered to:  Patient  DME Arranged:    DME Agency:     HH Arranged:  RN, PT, OT (patient only able to receive RN with her Medicaid) [place] Agency:  [place]  Status of Service:  Completed, signed off  Medicare Important Message Given:    Date Medicare IM Given:    Medicare IM give by:    Date Additional Medicare IM Given:    Additional Medicare Important Message give by:     If discussed at [place] of Stay Meetings, dates discussed:    Additional Comments:  Pollie Friar, RN 01/26/2016, 2:56 PM

## 2016-01-26 NOTE — Telephone Encounter (Signed)
PT's mother Santiago Glad called and wants a call back ASAP, she said its day 3 in the hospital and they are doing nothing and she is going to take her out of there if something doesn't get done/Dawn  CB#[phone]

## 2016-01-26 NOTE — Discharge Summary (Signed)
Physician Discharge Summary  Penny [name] WGN:[id number] DOB: December 04, 1986 DOA: 01/23/2016  PCP: [name], MD  Admit date: 01/23/2016 Discharge date: 01/26/2016  Recommendations for Outpatient Follow-up:  1. 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  -Although plans noted for possible IVIG--there was discomfort by pt's mother and neurology to treat empirically -B12 410 -01/26/16--case discussed with Dr. Nita Sickle -01/26/16--called [facility] to request direct transfer of care to their neurology service-->they refused acceptance -pt and mother refuses SNF -set up PT/OT at home  -while pt claims she cannot move her legs, I have witnessed her moving her legs antigravity -at some point she may need nerve biopsy/referral to tertiary care center   Chronic pain syndrome  -The patient has opioid seeking behavior  -Please note previous history of opioid abuse documented in medical record -Overnight 2/18--2/19 the patient's Dilaudid PCA was discontinued, and the patient was started on Dilaudid 1 mg every 3 hours.  -Patient has some nonphysiologic components of her physical examination--this has also been noted previously by Dr. Allena Katz  -I have told the patient that I will NOT prescribe any opioids at the time of discharge  Iron deficiency anemia -Iron saturation 6%, ferritin 5  -FeraHeme 510mg  given x 1 -continue oral iron -Patient endorses compliance with oral iron  Folate deficiency -Check RBC folate--pending  Anxiety/depression -Continue Tegretol, Cymbalta  Chronic  migraine -Continue Topamax -Continue carbamazepine  Transaminasemia -07/09/2015 MRCP--negative for choledocholithiasis;Revealed normal liver parenchyma  -Extensive workup including but not limited to autoimmune serology, hepatitis serologies and HIV are negative  -Most recent ANA on 12/12/2015-- 1:80   History of PNES (pseudoseizure) -documented from her visits at [name]   Discharge Condition: stable  Disposition: home  Diet:regular Wt Readings from Last 3 Encounters:  01/24/16 90.266 kg (199 lb)  01/02/16 95.737 kg (211 lb 1 oz)  12/12/15 91.627 kg (202 lb)    History of present illness:  A 30 year old female with a history of chronic migraine headaches, interstitial cystitis, anemia, transaminasemia, Roux-en-Y gastric bypass, presented with worsening bilateral lower extremity weakness and back pain. The patient has followed up with neuromuscular specialist, Nita Sickle, MD on Jan. 27, 2017. Briefly, the patient has had roughly 18 months of low back pain, left leg pain, left leg weakness, and tinglings in the distal legs and arms, all which first manifested around the time of the birth of her last child. Dr. Allena Katz cataloged the following primary complaints: Low back pain, severe and sharp, worsened with movement, left leg paresthesias, paresthesias of the face and arms that are painful, urge incontinence without bowel incontinence, and bilateral leg weakness left greater than right. Her workup included negative ANCA, negative SPEP, negative RPR, negative ESR and CRP, weakly positive ANA, normal TSH, and normal CSF cell count/differential, fungal cultures, IgG, oligoclonal bands, cytology, CSF Lyme PCR. 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 After admission, neurology was consulted and recommended MRI of the neural axis which was largely  unremarkable  Consultants: neurology  Discharge Exam: Filed Vitals:   01/26/16 0535 01/26/16 1103  BP: 110/69 103/58  Pulse: 67 81  Temp: 98.1 F (  36.7 C) 98.4 F (36.9 C)  Resp: 20 20   Filed Vitals:   01/25/16 1835 01/26/16 0055 01/26/16 0535 01/26/16 1103  BP: 112/65 107/68 110/69 103/58  Pulse: 70 76 67 81  Temp: 98.9 F (37.2 C) 97.6 F (36.4 C) 98.1 F (36.7 C) 98.4 F (36.9 C)  TempSrc: Oral Oral Oral Oral  Resp: 20 20 20 20   Height:      Weight:      SpO2: 98% 94% 97% 96%   General: A&O x 3, NAD, pleasant, cooperative Cardiovascular: RRR, no rub, no gallop, no S3 Respiratory: CTAB, no wheeze, no rhonchi Abdomen:soft, nontender, nondistended, positive bowel sounds Extremities: No edema, No lymphangitis, no petechiae  Discharge Instructions      Discharge Instructions    Diet - low sodium heart healthy    Complete by:  As directed      Increase activity slowly    Complete by:  As directed             Medication List    TAKE these medications        amphetamine-dextroamphetamine 30 MG 24 hr capsule  Commonly known as:  ADDERALL XR  Take 1 capsule (30 mg total) by mouth daily.     carbamazepine 200 MG tablet  Commonly known as:  TEGRETOL  Take two tablets at bedtime     clonazePAM 0.5 MG tablet  Commonly known as:  KLONOPIN  Take 1 tablet (0.5 mg total) by mouth 3 (three) times daily as needed for anxiety.     DULoxetine 60 MG capsule  Commonly known as:  CYMBALTA  Take 1 capsule (60 mg total) by mouth daily.     HYDROcodone-acetaminophen 5-325 MG tablet  Commonly known as:  NORCO/VICODIN  Take 1 tablet by mouth every 6 (six) hours as needed.     MENSTRUAL PAIN RELIEF 500-25-15 MG Tabs  Generic drug:  APAP-Pamabrom-Pyrilamine  Take 1-2 tablets by mouth daily as needed (for pain).     tiZANidine 4 MG tablet  Commonly known as:  ZANAFLEX  Take 1 tablet (4 mg total) by mouth 3 (three) times daily as needed for muscle spasms.      topiramate 50 MG tablet  Commonly known as:  TOPAMAX  Take 50-100 mg by mouth 2 (two) times daily. Take 50mg  in the am and 100mg  qhs     traZODone 150 MG tablet  Commonly known as:  DESYREL  Take 1 tablet (150 mg total) by mouth at bedtime as needed for sleep.         The results of significant diagnostics from this hospitalization (including imaging, microbiology, ancillary and laboratory) are listed below for reference.    Significant Diagnostic Studies: Dg Chest 2 View  12/28/2015  CLINICAL DATA:  History of lupus.  Seizure today. EXAM: CHEST  2 VIEW COMPARISON:  09/12/2014 FINDINGS: Cardiomediastinal silhouette is normal. Mediastinal contours appear intact. There is no evidence of focal airspace consolidation, pleural effusion or pneumothorax. Osseous structures are without acute abnormality. Soft tissues are grossly normal. IMPRESSION: No active cardiopulmonary disease. Electronically Signed   By: Ted Mcalpine M.D.   On: 12/28/2015 17:01   Dg Cervical Spine Complete  12/30/2015  CLINICAL DATA:  Status post fall, with neck pain. Initial encounter. EXAM: CERVICAL SPINE - COMPLETE 4+ VIEW COMPARISON:  CT of the cervical spine performed 05/09/2015, and MRI of the cervical spine performed 11/27/2015 FINDINGS: There is no evidence of fracture or subluxation. Vertebral bodies demonstrate normal height  and alignment. Intervertebral disc spaces are preserved. Prevertebral soft tissues are within normal limits. The provided odontoid view demonstrates no significant abnormality. The visualized lung apices are clear. IMPRESSION: No evidence of fracture or subluxation along the cervical spine. Electronically Signed   By: Roanna Raider M.D.   On: 12/30/2015 06:17   Dg Lumbar Spine Complete  12/30/2015  CLINICAL DATA:  Acute onset of lower back pain, status post fall. Initial encounter. EXAM: LUMBAR SPINE - COMPLETE 4+ VIEW COMPARISON:  MRI of the lumbar spine performed 11/26/2015 FINDINGS: There  is no evidence of fracture or subluxation. Vertebral bodies demonstrate normal height and alignment. Intervertebral disc spaces are preserved. The visualized neural foramina are grossly unremarkable in appearance. The visualized bowel gas pattern is unremarkable in appearance; air and stool are noted within the colon. The sacroiliac joints are within normal limits. IMPRESSION: No evidence of fracture or subluxation along the lumbar spine. Electronically Signed   By: Roanna Raider M.D.   On: 12/30/2015 06:18   Dg Pelvis 1-2 Views  12/30/2015  CLINICAL DATA:  Status post seizure and fall, with bilateral hip pain. Initial encounter. EXAM: PELVIS - 1-2 VIEW COMPARISON:  CT of the abdomen and pelvis from 06/11/2015 FINDINGS: There is no evidence of fracture or dislocation. Both femoral heads are seated normally within their respective acetabula. No significant degenerative change is appreciated. The sacroiliac joints are unremarkable in appearance. The visualized bowel gas pattern is grossly unremarkable in appearance. IMPRESSION: No evidence of fracture or dislocation. Electronically Signed   By: Roanna Raider M.D.   On: 12/30/2015 06:16   Mr Laqueta Jean VZ Contrast  01/24/2016  CLINICAL DATA:  Progressive lower extremity numbness, severe neuropathy, subsequent worsening. Possible autoimmune disease. EXAM: MRI HEAD WITHOUT AND WITH CONTRAST TECHNIQUE: Multiplanar, multiecho pulse sequences of the brain and surrounding structures were obtained without and with intravenous contrast. CONTRAST:  20mL MULTIHANCE GADOBENATE DIMEGLUMINE 529 MG/ML IV SOLN COMPARISON:  MRI of the head November 27, 2015 FINDINGS: The ventricles and sulci are normal for patient's age. No abnormal parenchymal signal, mass lesions, mass effect. No abnormal parenchymal enhancement. No reduced diffusion to suggest acute ischemia. No susceptibility artifact to suggest hemorrhage. No abnormal extra-axial fluid collections. No extra-axial masses nor  leptomeningeal enhancement. Normal major intracranial vascular flow voids seen at the skull base. 5 mm pineal cyst. Ocular globes and orbital contents are unremarkable though not tailored for evaluation. No suspicious calvarial bone marrow signal. No abnormal sellar expansion. Craniocervical junction maintained. Mild paranasal sinus mucosal thickening. Mastoid air cells are well aerated. IMPRESSION: 6 mm pineal cyst, otherwise unremarkable MRI of the brain with and without contrast. Electronically Signed   By: Awilda Metro M.D.   On: 01/24/2016 02:38   Mr Cervical Spine W Wo Contrast  01/24/2016  CLINICAL DATA:  Progressive lower extremity numbness, severe neuropathy, subsequent worsening. Possible autoimmune disease. EXAM: MRI CERVICAL SPINE WITHOUT AND WITH CONTRAST TECHNIQUE: Multiplanar and multiecho pulse sequences of the cervical spine, to include the craniocervical junction and cervicothoracic junction, were obtained according to standard protocol without and with intravenous contrast. CONTRAST:  20 cc MultiHance COMPARISON:  MRI of the cervical spine November 27, 2015 FINDINGS: Mildly motion degraded examination. Cervical vertebral bodies and posterior elements are intact and aligned and maintenance of cervical lordosis. Intervertebral discs demonstrate normal morphology and signal characteristics. No bone marrow signal to suggest acute osseous process. No abnormal osseous or intradiscal enhancement. Cervical spinal cord appears normal morphology and signal characteristics though, mild patient  motion decreases sensitivity for subtle cord signal abnormality. No abnormal cord, leptomeningeal or epidural enhancement. Normal included prevertebral and paraspinal soft tissues. C2-3, C3-4: No disc bulge, canal stenosis nor neural foraminal narrowing. C4-5: Stable small central disc protrusion, no canal stenosis or neural foraminal narrowing. C6-7 through C7-T1: No disc bulge, canal stenosis nor neural  foraminal narrowing. IMPRESSION: Mildly motion degraded examination. Stable small central disc protrusion at C4-5, otherwise unremarkable MRI of the cervical spine with and without contrast. Electronically Signed   By: Awilda Metro M.D.   On: 01/24/2016 02:58   Mr Thoracic Spine W Wo Contrast  01/24/2016  CLINICAL DATA:  Progressive lower extremity numbness, severe neuropathy, subsequent worsening. Possible autoimmune disease. EXAM: MRI THORACIC AND LUMBAR SPINE WITHOUT AND WITH CONTRAST TECHNIQUE: Multiplanar and multiecho pulse sequences of the thoracic and lumbar spine were obtained without and with intravenous contrast. CONTRAST:  20 cc MultiHance COMPARISON:  MRI of the lumbar spine November 26, 2015 FINDINGS: MR THORACIC SPINE FINDINGS Mild motion degraded examination. Mild T8 chronic compression fracture less than 20% height loss. The remaining thoracic vertebral bodies intact and aligned. Maintenance of thoracic kyphosis. Intervertebral discs demonstrate normal morphology. No was bone marrow signal to suggest acute osseous process. No abnormal osseous or intradiscal enhancement. Bright T1 and bright T2 small hemangiomas T6 and T7. Thoracic spinal cord is normal morphology and signal characteristics the level the conus medullaris which terminates at L1. No abnormal cord, leptomeningeal or epidural enhancement. Included prevertebral soft tissues are normal. Dependent atelectasis in the lung bases. No disc bulge, canal stenosis or neural foraminal narrowing at any level. MR LUMBAR SPINE FINDINGS Lumbar vertebral bodies and posterior elements are intact and aligned with maintenance of lumbar lordosis. Mild L3-4 and L4-5 disc height loss is similar to prior examination with decreased T2 signal within these 2 discs consistent with mild desiccation. No bone marrow signal abnormality to suggest acute osseous process. No abnormal osseous or intradiscal enhancement. Conus medullaris terminates at L1 and  appears normal morphology and signal characteristics. Cauda equina is normal. No abnormal cord, leptomeningeal or epidural enhancement. Included prevertebral and paraspinal soft tissues are normal. Level by level evaluation: L1-2, L2-3: No disc bulge, canal stenosis nor neural foraminal narrowing. L3-4: Small RIGHT central disc protrusion and enhancing annular fissure is similar to prior examination. No canal stenosis or neural foraminal narrowing. L4-5: Similar RIGHT central 3 mm disc extrusion, 11 mm contiguous superior and inferior migration, similar to prior examination. No canal stenosis or neural foraminal narrowing. L5-S1: No disc bulge, canal stenosis nor neural foraminal narrowing. IMPRESSION: MRI THORACIC SPINE:  Mild motion degraded examination. Old mild T8 compression fracture with otherwise unremarkable MRI of the thoracic spine with and without contrast. MRI LUMBAR SPINE: Stable small L3-4 disc protrusion and small L4-5 contiguous disc extrusion. Otherwise unremarkable MRI of the lumbar spine without contrast. Electronically Signed   By: Awilda Metro M.D.   On: 01/24/2016 03:19   Mr Lumbar Spine W Wo Contrast  01/24/2016  CLINICAL DATA:  Progressive lower extremity numbness, severe neuropathy, subsequent worsening. Possible autoimmune disease. EXAM: MRI THORACIC AND LUMBAR SPINE WITHOUT AND WITH CONTRAST TECHNIQUE: Multiplanar and multiecho pulse sequences of the thoracic and lumbar spine were obtained without and with intravenous contrast. CONTRAST:  20 cc MultiHance COMPARISON:  MRI of the lumbar spine November 26, 2015 FINDINGS: MR THORACIC SPINE FINDINGS Mild motion degraded examination. Mild T8 chronic compression fracture less than 20% height loss. The remaining thoracic vertebral bodies intact and aligned. Maintenance  of thoracic kyphosis. Intervertebral discs demonstrate normal morphology. No was bone marrow signal to suggest acute osseous process. No abnormal osseous or intradiscal  enhancement. Bright T1 and bright T2 small hemangiomas T6 and T7. Thoracic spinal cord is normal morphology and signal characteristics the level the conus medullaris which terminates at L1. No abnormal cord, leptomeningeal or epidural enhancement. Included prevertebral soft tissues are normal. Dependent atelectasis in the lung bases. No disc bulge, canal stenosis or neural foraminal narrowing at any level. MR LUMBAR SPINE FINDINGS Lumbar vertebral bodies and posterior elements are intact and aligned with maintenance of lumbar lordosis. Mild L3-4 and L4-5 disc height loss is similar to prior examination with decreased T2 signal within these 2 discs consistent with mild desiccation. No bone marrow signal abnormality to suggest acute osseous process. No abnormal osseous or intradiscal enhancement. Conus medullaris terminates at L1 and appears normal morphology and signal characteristics. Cauda equina is normal. No abnormal cord, leptomeningeal or epidural enhancement. Included prevertebral and paraspinal soft tissues are normal. Level by level evaluation: L1-2, L2-3: No disc bulge, canal stenosis nor neural foraminal narrowing. L3-4: Small RIGHT central disc protrusion and enhancing annular fissure is similar to prior examination. No canal stenosis or neural foraminal narrowing. L4-5: Similar RIGHT central 3 mm disc extrusion, 11 mm contiguous superior and inferior migration, similar to prior examination. No canal stenosis or neural foraminal narrowing. L5-S1: No disc bulge, canal stenosis nor neural foraminal narrowing. IMPRESSION: MRI THORACIC SPINE:  Mild motion degraded examination. Old mild T8 compression fracture with otherwise unremarkable MRI of the thoracic spine with and without contrast. MRI LUMBAR SPINE: Stable small L3-4 disc protrusion and small L4-5 contiguous disc extrusion. Otherwise unremarkable MRI of the lumbar spine without contrast. Electronically Signed   By: Awilda Metro M.D.   On:  01/24/2016 03:19     Microbiology: No results found for this or any previous visit (from the past 240 hour(s)).   Labs: Basic Metabolic Panel:  Recent Labs Lab 01/23/16 1816 01/26/16 0517  NA 140 140  K 3.9 4.0  CL 107 108  CO2 20* 24  GLUCOSE 91 97  BUN 11 8  CREATININE 0.60 0.63  CALCIUM 9.6 9.2  MG  --  2.2   Liver Function Tests:  Recent Labs Lab 01/23/16 1816 01/26/16 0517  AST 68* 48*  ALT 93* 75*  ALKPHOS 112 101  BILITOT 0.3 0.4  PROT 7.4 6.1*  ALBUMIN 4.4 3.5   No results for input(s): LIPASE, AMYLASE in the last 168 hours. No results for input(s): AMMONIA in the last 168 hours. CBC:  Recent Labs Lab 01/23/16 1816 01/26/16 0521  WBC 5.6 4.6  NEUTROABS 2.6  --   HGB 10.0* 8.9*  HCT 32.8* 29.1*  MCV 82.6 82.4  PLT 349 260   Cardiac Enzymes: No results for input(s): CKTOTAL, CKMB, CKMBINDEX, TROPONINI in the last 168 hours. BNP: Invalid input(s): POCBNP CBG: No results for input(s): GLUCAP in the last 168 hours.  Time coordinating discharge:  Greater than 30 minutes  Signed:  Bergen Melle, DO [facility] Pager: [phone] 01/26/2016, 12:02 PM

## 2016-01-26 NOTE — Evaluation (Signed)
Physical Therapy Evaluation Patient Details Name: Penny [name] MRN: [id number] DOB: October 12, 1986 Today's Date: 01/26/2016   History of Present Illness  Penny [name] is a 30 y.o. female with a past medical history significant for chronic migraines, interstitial cystitis, depression, anemia, unexplained transaminitis, and hx of Roux-Y gastric bypass, and now progressive distal neuropathy who presents with back pain and leg weakness.  Clinical Impression  Pt presenting with depressed mood, back and L LE pain and numbness. Pt with controlled L LE deficit as noted by controlled movement in L LE during ambulation however pt cries in pain when attempting AROM to L hip/knee/ankle. Pt with report of freq falls at home. Recommend HHPT to address strength and balance deficits and recommend psych consult for depression. Acute PT to con't to follow to progress mobility as able.    Follow Up Recommendations Home health PT;Supervision/Assistance - 24 hour    Equipment Recommendations  None recommended by PT    Recommendations for Other Services       Precautions / Restrictions Precautions Precautions: Fall Restrictions Weight Bearing Restrictions: No      Mobility  Bed Mobility Overal bed mobility: Needs Assistance Bed Mobility: Supine to Sit;Sit to Supine     Supine to sit: Modified independent (Device/Increase time) Sit to supine: Min assist   General bed mobility comments: pt elevated HOB, attempted to do logroll, pt slow and guarded due to back pain. pt required minA for LE management back into bed  Transfers Overall transfer level: Needs assistance Equipment used: 4-wheeled walker Transfers: Sit to/from Stand Sit to Stand: Min guard         General transfer comment: atypical technique, pt pushing up from RW (pt did lock it) Pt with narrowed base support and pushing up through arms, pt adducted LEs together and solely WBed on R LE to push self up with mostly dependence on UEs.    Ambulation/Gait Ambulation/Gait assistance: Min guard Ambulation Distance (Feet): 10 Feet (x2 to/from bathroom) Assistive device: 4-wheeled walker Gait Pattern/deviations: Decreased dorsiflexion - left;Decreased weight shift to left;Narrow base of support Gait velocity: slow Gait velocity interpretation: Below normal speed for age/gender General Gait Details: pt with increased dependence on bilat UE. Pt with controlled dragging of L LE but was able to pick up L LE to bring L LE through in plantarflexion. pt with hyperextension of L knee. by end of amb to/from bathroom pt in tears crying due to pain  Stairs            Wheelchair Mobility    Modified Rankin (Stroke Patients Only)       Balance Overall balance assessment: Needs assistance Sitting-balance support: Feet supported;Bilateral upper extremity supported Sitting balance-Leahy Scale: Poor Sitting balance - Comments: relies on UEs to support due to back pain   Standing balance support: Bilateral upper extremity supported Standing balance-Leahy Scale: Zero Standing balance comment: requires UE assist ot maintin upright position, pt washed hands at sink and had to lean up against sink.  pt with history of falls                             Pertinent Vitals/Pain Pain Assessment: 0-10 Pain Score: 7  Pain Location: back and L LE Pain Intervention(s): Monitored during session    Home Living Family/patient expects to be discharged to:: Private residence Living Arrangements: Alone Available Help at Discharge: Family;Available 24 hours/day Type of Home: House Home Access: Stairs to enter  Entrance Stairs-Rails: Left;Right Entrance Stairs-Number of Steps: 2 Home Layout: One level Home Equipment: Walker - 4 wheels;Wheelchair - manual;Shower seat      Prior Function Level of Independence: Independent with assistive device(s)         Comments: using rollator     Hand Dominance         Extremity/Trunk Assessment   Upper Extremity Assessment: Overall WFL for tasks assessed           Lower Extremity Assessment: LLE deficits/detail   LLE Deficits / Details: pain with all ROM at hip, knee, and ankle, due to pain grossly 2/5  Cervical / Trunk Assessment: Normal  Communication   Communication: No difficulties  Cognition Arousal/Alertness: Awake/alert Behavior During Therapy: WFL for tasks assessed/performed Overall Cognitive Status: Within Functional Limits for tasks assessed                      General Comments General comments (skin integrity, edema, etc.): suspect pt severely depressed. contacted Dr. Carles Collet regarding pt's depressed mood and symptoms and functional deficits.    Exercises        Assessment/Plan    PT Assessment Patient needs continued PT services  PT Diagnosis Difficulty walking;Acute pain   PT Problem List Decreased strength;Decreased activity tolerance;Decreased balance;Decreased mobility;Other (comment)  PT Treatment Interventions DME instruction;Gait training;Stair training;Functional mobility training;Therapeutic activities;Therapeutic exercise   PT Goals (Current goals can be found in the Care Plan section) Acute Rehab PT Goals Patient Stated Goal: stop the pain PT Goal Formulation: With patient Time For Goal Achievement: 02/09/16 Potential to Achieve Goals: Good    Frequency Min 3X/week   Barriers to discharge Decreased caregiver support lives alone and mother works. pt with freq falls at home    Co-evaluation               End of Session   Activity Tolerance: Patient limited by pain Patient left: in bed Nurse Communication: Mobility status         Time: [id number] PT Time Calculation (min) (ACUTE ONLY): 27 min   Charges:   PT Evaluation $PT Eval Moderate Complexity: 1 Procedure PT Treatments $Gait Training: 8-22 mins   PT G CodesKingsley Callander 01/26/2016, 3:49 PM   Kittie Plater, PT, DPT Pager #: [phone] Office #: [phone]

## 2016-01-26 NOTE — Progress Notes (Signed)
Pt requested that the PIV be restarted because it's in the [facility] area.  Per the nurse, the PIV is working properly she states that the patient is going home and does not need her PIV restarted. Penny [name]

## 2016-01-26 NOTE — Care Management Note (Signed)
Case Management Note  Patient Details  Name: Penny [name] MRN: [id number] Date of Birth: 04/23/1986  Subjective/Objective:                    Action/Plan: Patient was admitted with pain and weakness. Will follow for discharge needs pending PT/OT evals and physician orders. Expected Discharge Date:                  Expected Discharge Plan:     In-House Referral:     Discharge planning Services     Post Acute Care Choice:    Choice offered to:     DME Arranged:    DME Agency:     HH Arranged:    HH Agency:     Status of Service:  In process, will continue to follow  Medicare Important Message Given:    Date Medicare IM Given:    Medicare IM give by:    Date Additional Medicare IM Given:    Additional Medicare Important Message give by:     If discussed at [place] of Stay Meetings, dates discussed:    Additional Comments:  Rolm Baptise, RN 01/26/2016, 10:57 AM [phone]

## 2016-01-26 NOTE — Progress Notes (Signed)
Patient is being discharge and verbalized that she is unable to get transportation until after 1700. Patient is attempting to call for transportation now. Discharge instructions reviewed with patient. All questions answered at this time. Patient does wish to go home to be with family at this time. MD already called patient's mother and updated POC. Awaiting for transport.   Penny [name].

## 2016-01-26 NOTE — Progress Notes (Signed)
Offered to ambulate patient in chair but she verbalized that her symptoms worsen when sitting up.   Ave Filter, RN

## 2016-01-27 ENCOUNTER — Telehealth: Payer: Self-pay | Admitting: Family Medicine

## 2016-01-27 ENCOUNTER — Telehealth: Payer: Self-pay | Admitting: Neurology

## 2016-01-27 NOTE — Telephone Encounter (Signed)
Pt is at home and needs to talk to you about moving appt for the biopsies please call [phone]

## 2016-01-27 NOTE — Telephone Encounter (Signed)
She may have a prescription for 16 tablets, she needs to keep follow-up visit, if having worsening pain may need referral to pain management

## 2016-01-27 NOTE — Telephone Encounter (Signed)
Last filled 1/25- due 2/24(patient has appt with you 2/24 for pain management and refill?)

## 2016-01-27 NOTE — Telephone Encounter (Signed)
I am sympathetic to this patient but at the same time I will not and nor am I comfortable with increasing her pain medicines. This patient was prescribed 120 tablets a proximally 30 days ago with the instruction to not use more than 4 per day. I realize she states she is in pain but she also has a history of abusing medications. I will not exceed what I am currently prescribing. I highly recommend pain management for this patient she is a complex patient that will need ongoing care for her pain management which exceeds my capabilities. Please inquire with the patient if she is currently set up with pain management. Izora Ribas may need to coordinate with neurology office to find out what the status is of this patient's pain management. She may have a 2 week supply of medication. Hydrocodone 5 mg/325 mg, 1 every 6 hours when necessary pain #56. In 2 weeks she may have more. We will not issue medication early. The patient will need to understand that regardless if she is prescribed here or at pain management she will need to make office visits no exceptions.

## 2016-01-27 NOTE — Telephone Encounter (Signed)
Patient with recent hospitalization per patient- see records

## 2016-01-27 NOTE — Telephone Encounter (Signed)
Pt is requesting a refill on her pain medication  

## 2016-01-28 ENCOUNTER — Other Ambulatory Visit: Payer: Self-pay | Admitting: *Deleted

## 2016-01-28 DIAGNOSIS — R29898 Other symptoms and signs involving the musculoskeletal system: Secondary | ICD-10-CM | POA: Insufficient documentation

## 2016-01-28 MED ORDER — HYDROCODONE-ACETAMINOPHEN 5-325 MG PO TABS: 1.0000 | ORAL_TABLET | Freq: Four times a day (QID) | ORAL | Status: DC | PRN

## 2016-01-28 NOTE — Telephone Encounter (Signed)
Discussed with pt. Pt verbalized understanding. 2 week script ready for pickup. Brendale please see note about pain management.

## 2016-01-28 NOTE — Telephone Encounter (Signed)
Called patient back and left message for her to call me back.  

## 2016-01-28 NOTE — Telephone Encounter (Signed)
Per Raquel Sarna with Dr. Freddie Apley office, patient has [name] listed on [place], they can not see patient until she gets card fixed  [name] states she spoke with the patient 01/22/16 and explained to pt that card has to be changed in order to get scheduled

## 2016-01-28 NOTE — Telephone Encounter (Signed)
[facility] states that they faxed pain management referral to Dr. Merlene Laughter on 01/20/16, [facility] for Dr. Freddie Apley office manager (handles referrals) to let me know status

## 2016-01-30 ENCOUNTER — Telehealth: Payer: Self-pay | Admitting: Family Medicine

## 2016-01-30 ENCOUNTER — Encounter: Payer: Self-pay | Admitting: Family Medicine

## 2016-01-30 ENCOUNTER — Ambulatory Visit (INDEPENDENT_AMBULATORY_CARE_PROVIDER_SITE_OTHER): Payer: Medicaid Other | Admitting: Family Medicine

## 2016-01-30 DIAGNOSIS — R29898 Other symptoms and signs involving the musculoskeletal system: Secondary | ICD-10-CM

## 2016-01-30 DIAGNOSIS — M79605 Pain in left leg: Secondary | ICD-10-CM | POA: Diagnosis not present

## 2016-01-30 MED ORDER — TIZANIDINE HCL 4 MG PO TABS: 4.0000 mg | ORAL_TABLET | Freq: Three times a day (TID) | ORAL | Status: DC | PRN

## 2016-01-30 NOTE — Progress Notes (Signed)
   Subjective:    Patient ID: Penny [name], female    DOB: Jul 06, 1986, 30 y.o.   MRN: [id number]  HPI  Patient arrives for a follow up on extreme leg weakness. She was recently in the hospital with severe paresis of the legs and pain in the legs MRI was completed of the brain neck thoracic spine lumbar spine no specific reason detected. Patient was treated with muscle relaxers and was allowed to go home. She has follow-up with neurology next week she was prescribed hydrocodone by [name] to help with pain at that time I told the patient I was not comfortable with going above that dose she stated that this particular dose of medication at times makes her feel more height. She denies abusing it. I will prescribe 4 per day for now. Review of Systems     Objective:   Physical Exam  Legs do not have edema but do have subjective tenderness throughout both legs and weakness in both legs she is able to walk with a very shuffled almost weekend leg approach with swiveling of the hips and using the walker to walk with.  25 minutes was spent with the patient. Greater than half the time was spent in discussion and answering questions and counseling regarding the issues that the patient came in for today.     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  Patient was warned to only use walker not to walk independently. She was told she is at risk of falling and she needs to keep her cell phone with her so if she falls she can call for help.  Pain management-we are working in conjunction with neurology to try to get this patient pain management in [place]. Currently right now I am prescribing hydrocodone 4 times per day. I told the patient that I do not feel  comfortable going above that dosage.  This patient is disabled we will follow-up in 3 months time

## 2016-01-30 NOTE — Telephone Encounter (Signed)
LMOVM -  Need to come by and sign form so that we can help get [place] [facility] provider changed on her Medicaid card   This is necessary to help with the referral process  [facility] referred pt to Dr. Merlene Laughter for pain management and they can not process referral due to provider on card is incorrect.  (Once form is signed & dated by the pt, I will fax in for the change to be made and once it shows correct in [place], Dr. Merlene Laughter will then be able to process pain management referral & I will b aeble to send necessary OV notes to Dr. Merlene Laughter for referral process)

## 2016-02-02 ENCOUNTER — Ambulatory Visit (INDEPENDENT_AMBULATORY_CARE_PROVIDER_SITE_OTHER): Payer: Medicaid Other | Admitting: Neurology

## 2016-02-02 ENCOUNTER — Encounter: Payer: Self-pay | Admitting: Neurology

## 2016-02-02 VITALS — BP 110/70 | HR 84 | Ht 66.0 in | Wt 201.4 lb

## 2016-02-02 DIAGNOSIS — G6181 Chronic inflammatory demyelinating polyneuritis: Secondary | ICD-10-CM | POA: Diagnosis not present

## 2016-02-02 NOTE — Patient Instructions (Addendum)
Referral to Dr. Donnella Bi office for left sural nerve biopsy Call your insurance company to determine if they cover genetic testing Pending the results of your nerve biopsy, we can consider steroid treatment or intravenous immunoglobulin Please continue to call [facility] for any cancellations on their schedule Return to clinic in 2 months

## 2016-02-02 NOTE — Progress Notes (Signed)
Follow-up Visit   Date: 02/02/2016    Penny [name] MRN: [id number] DOB: 1986/04/23   Interim History: Penny [name] is a 30 y.o. right-handed Caucasian female with with major depression, migraines, ADHD, anixety, gastric bypass surgery (2007), fibromyalgia, current tobacco use, and nonepileptic spells returning to the clinic for follow-up of bilateral leg weakness and pain.  The patient was accompanied to the clinic by mother who also provides collateral information.    History of present illness: She reports having a complicated pregnancy with her daughter and developed preeclampsia and was induced at 34.5 weeks (August 2015). She reports having two epidurals which were not completely successful. She reports having difficulty delivering her placenta and was eventually taken to surgery to remove the products. She was in the lithotomy position for 4 hours and was taken to the ICU. The following morning, he had a fall due to left leg weakness. She had back and left leg pain which followed. Imaging of her low back was normal. She was discharged home with a walker and slowly improved over the following weeks.  Since her pregnancy she had other medical issues including spells of nonepileptic events which were diagnosed at [facility].   Starting in October 2016, she had a fall and initially attributed it to ankle instability, but this slowly progressed and she began having more frequent falls. In December 2016, she develop relatively acute onset of left leg weakness/numbness causing another fall and inability to walk which prompted her to go to the emergency department. Over the preceding few days, she also began experiencing similar symptoms over the right leg and numbness of the left side of her face. She also complains of urinary incontinence, described more as urge incontinence as she feels the need to go but cannot control it.   She was admitted to [facility] on 12/21 -  12/22/ where she presented with progressively worsening left leg weakness, numbness, and falls. She had MRI of the brain, cervical, and lumbar spine which essentially was nondiagnostic. Labs indicated folate deficiency and low ferritin levels. She had mild elevation in AST.  Presenting symptoms include: - Low back pain, shooting pain. Laying supine improves the pain, activity exacerbates her pain - Left leg is cold from the knee down, as if there is a "ball at the bottom of the foot" numbness, numbness involves her entire left lower leg, lateral thigh and hip - Left leg weakness from the knee down, with minimal movement of the foot and toes - Falls about daily, even with the walker. She cannot stand to rise independently. - Tingling and numbness of the face and arms. 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 She also has mild transaminitis and followed at [facility]. She also has major depression, anxiety, ADHD, and insomnia and takes tegretol for mood stabilization, Cymbalta for depression, trazodone for sleep. For anxiety and ADHD, she takes Adderall XR 30 mg daily for ADHD.  UPDATE 01/01/2015:  Since her last visit, she underwent CSF testing and lab testing which has essentially returned normal, except for very mild elevation in ANA (1:80 speckled pattern).  dsDNA and heavy metal screen is pending.  She had one ER visit on 1/22 for prolonged "seizure" lasting two hours. 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 She has been given hydrocodone by her PCP and reports that her pain is much better controlled and she is able to do do more.  Yesterday, she was even able to do home chores or sweeping and cleaning.  She continues to have left arm heaviness  and paresthesias, but overall feels that her leg paresthesias and weakness are improved and attributes this to better pain control.  UPDATE 02/02/2016:  Patient called the office on 2/17 states that she was unable to move her legs and having urinary incontinenece and was previously doing much better.  She was instructed to go to the ER for evaluation and was admitted for work-up.  Contrasted MRI of the entire neuroaxis was performed and returned essentially normal.  Folate levels are improved.  Transfer to [facility] for evaluation was denied and patient was discharged home.  Since being home, she has been able to move her legs and although unsteady, is walking independently again.  She continues to have paresthesias of the arms and legs, worse on the left.      Medications:  Current Outpatient Prescriptions on File Prior to Visit  Medication Sig Dispense Refill  . amphetamine-dextroamphetamine (ADDERALL XR) 30 MG 24 hr capsule Take 1 capsule (30 mg total) by mouth daily. 30 capsule 0  . APAP-Pamabrom-Pyrilamine (MENSTRUAL PAIN RELIEF) 500-25-15 MG TABS Take 1-2 tablets by mouth daily as needed (for pain).    . carbamazepine (TEGRETOL) 200 MG tablet Take two tablets at bedtime (Patient taking differently: Take 400 mg by mouth at bedtime. Take two tablets at bedtime) 60 tablet 2  . clonazePAM (KLONOPIN) 0.5 MG tablet Take 1 tablet (0.5 mg total) by mouth 3 (three) times daily as needed for anxiety. 90 tablet 0  . DULoxetine (CYMBALTA) 60 MG capsule Take 1 capsule (60 mg total) by mouth daily. 30 capsule 2  . HYDROcodone-acetaminophen (NORCO/VICODIN) 5-325 MG tablet Take 1 tablet by mouth every 6 (six) hours as needed. 56 tablet 0  . tiZANidine (ZANAFLEX) 4 MG tablet Take 1 tablet (4 mg total) by mouth 3 (three) times daily as needed for muscle spasms. 30 tablet 1  . topiramate (TOPAMAX) 50 MG tablet Take 50-100 mg by mouth 2 (two) times daily. Take 50mg  in the am and 100mg  qhs    . traZODone (DESYREL) 150 MG  tablet Take 1 tablet (150 mg total) by mouth at bedtime as needed for sleep. 30 tablet 2   No current facility-administered medications on file prior to visit.    Allergies:  Allergies  Allergen Reactions  . Gabapentin Other (See Comments)    Pt states that she was unresponsive after taking this medication, but her vitals remained stable.    . Metoclopramide Hcl Anxiety and Other (See Comments)    Pt states that she felt like she was trapped in a box, and could not get out.  Pt also states that she had temporary loss of movement, weakness, and tingling.    . Morphine And Related Other (See Comments)    Chest pain   . Tramadol Other (See Comments)    Seizures  . Zofran [Ondansetron] Other (See Comments)    Migraines   . Latex Rash  . Tape Itching and Rash    Please use paper tape    Review of Systems:  CONSTITUTIONAL: No fevers, chills, night sweats, or weight loss.  EYES: No visual changes or eye pain ENT: No hearing changes.  No history of nose bleeds.  RESPIRATORY: No cough, wheezing and shortness of breath.   CARDIOVASCULAR: Negative for chest pain, and palpitations.   GI: Negative for abdominal discomfort, blood in stools or black stools.  No recent change in bowel habits.   GU:  No history of incontinence.   MUSCLOSKELETAL: No history of joint pain or swelling.  No myalgias.   SKIN: Negative for lesions, rash, and itching.   ENDOCRINE: Negative for cold or heat intolerance, polydipsia or goiter.   PSYCH:  + depression or anxiety symptoms.   NEURO: As Above.   Vital Signs:  BP 110/70 mmHg  Pulse 84  Ht 5\' 6"  (1.676 m)  Wt 201 lb 7 oz (91.371 kg)  BMI 32.53 kg/m2  SpO2 98%   General: Well appearing, comfortable.  Neurological Exam: MENTAL STATUS including orientation to time, place, person, recent and remote memory, attention span and concentration, language, and fund of knowledge is normal.  Speech is not dysarthric.  CRANIAL NERVES:   Pupils equal round and  reactive to light.  Normal conjugate, extra-ocular eye movements in all directions of gaze.  No ptosis. Normal facial sensation.  Face is symmetric. Palate elevates symmetrically.  Tongue is midline.  MOTOR:  No atrophy, fasciculations or abnormal movements.  No pronator drift.  Tone is normal.    Right Upper Extremity:    Left Upper Extremity:    Deltoid  5/5   Deltoid  5/5   Biceps  5/5   Biceps  5/5   Triceps  5/5   Triceps  5/5   Wrist extensors  5/5   Wrist extensors  5/5   Wrist flexors  5/5   Wrist flexors  5/5   Finger extensors  5/5   Finger extensors  5/5   Finger flexors  5/5   Finger flexors  5/5   Dorsal interossei  4/5   Dorsal interossei  4/5   Abductor pollicis  4/5   Abductor pollicis  4/5   Tone (Ashworth scale)  0  Tone (Ashworth scale)  0   Right Lower Extremity:    Left Lower Extremity:    Hip flexors  5/5   Hip flexors  5-/5   Hip extensors  5/5   Hip extensors  5/5   Knee flexors  5/5   Knee flexors  5/5   Knee extensors  5/5   Knee extensors  5-/5   Inversion 5/5  Inversion 4/5  Eversion 5/5  Eversion 4/5  Dorsiflexors  5/5   Dorsiflexors  5/5   Plantarflexors  5/5   Plantarflexors  5/5   Toe extensors  5/5   Toe extensors  5/5   Toe flexors  5/5   Toe flexors  4-/5   Tone (Ashworth scale)  0  Tone (Ashworth scale)  0   MSRs:  Reflexes are 2+/4 throughout, except absent at the left ankle.  Plantars are downgoing.  SENSORY:  Sensation to all modalities intact in the upper extremities.  Vibration, pin prick, and temperature is reduced over the entire left lower leg and foot.   COORDINATION/GAIT:  Normal finger-to- nose-finger.  Gait appears wide-based and mildly unsteady, unassisted.  Data: Labs 11/27/2015: ESR 19, CRP <0.5, ferritin 5*, folate 4.9**, vitamin B12 410, TSH 0.977 Labs 11/15/2015: AST 45*, ALT 47, Cr 0.69 Labs 12/12/2015:  c/p-ANCA negative, SPEP/UPEP with IFE no M protein, SSA/B neg, ANA 1:80 speckled*, RPR neg, copper 118, cryoglobulin  negative, selenium 127, zinc 69 Labs 12/31/2015:  Lead neg, arsenic 6 (normal <15), mercury  5.0 (normal <20), dsDNA neg Labs 01/24/2016:  Folate RBC 886  CSF testing 12/19/2015:  W1 R0 G63  P28   ACE 11, no OCB, Lyme neg, IgG index 0.42  MRI brain wo contrast 11/27/2015: 1. No acute intracranial abnormality. 2. 6 mm pineal cyst, otherwise unremarkable appearance of the brain.  MRI cervical spine wwo contrast 11/27/2015: 1. No acute abnormality within the cervical spine. No significant stenosis or cord signal abnormality. 2. Posterior disc protrusion at C4-5 without significant stenosis or evidence of neural impingement. 3. Tiny central disc protrusion at C6-7 without stenosis.  MRI lumbar spine wo contrast 11/27/2015: 1. Stable appearance of the lumbar spine. No evidence of severe stenosis or cord compression. No new disc herniation. 2. Small RIGHT paracentral disc protrusion at L3-4 without stenosis or neural impingement. 3. Small RIGHT paracentral disc protrusion at L4-5 without stenosis or evidence of neural impingement.  MRI brain, cervical spine, thoracic spine, and lumbar spine wwo contrast 01/24/2016: MRI BRAIN:  6 mm pineal cyst, otherwise unremarkable MRI of the brain with and without contrast. MRI CERVICAL SPINE:    Mildly motion degraded examination.  Stable small central disc protrusion at C4-5, otherwise unremarkable MRI THORACIC SPINE: Mild motion degraded examination.  Old mild T8 compression fracture with otherwise unremarkable MRI of the thoracic spine with and without contrast. MRI LUMBAR SPINE: Stable small L3-4 disc protrusion and small L4-5 contiguous disc extrusion.   NCS/EMG of the left leg 12/12/2015:  This is a limited study as needed electrode examination was prematurely terminated due to pain. 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 Unfortunately, needle electrode examination  of proximal muscles was not performed however the appropriate clinical setting, evaluation for polyradiculoneuropathy should be considered.  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 Overall, these findings are severe in degree electrically; however, there is mild interval improvement in the lower extremities when compared to her electrodiagnostic testing dated 12/11/2015.   IMPRESSION/PLAN: In brief, Ms. Corallo is a 30 year-old female returning for evaluation of subacute onset of left > right leg weakness, painful paresthesias, and falls since October 2016. She had underwent extensive testing including NCS/EMG which showed a subacute severe polyneuropathy of the legs. Because of her NCS findings, extensive CSF and lab testing was ordered and has returned largely nondiagnostic. She was noted to have folate deficiency and started supplementation. She was last seen in the clinic on 1/27 at which time her weakness and pain was doing better. 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 Overall, these findings are severe in degree electrically; however, there is mild interval improvement in the lower extremities when compared to her electrodiagnostic testing dated 12/11/2015.  Although there is no serology testing that clearly points to an inflammatory condition, the fact that she has active denervation on her EMG and severe neuropathy, pushes me to treat more aggressively. 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  Left sural nerve biopsy will be ordered to look for  infiltrative disease and vasculitis.  Pending the results of the testing, the plan will be to treat with Solumedrol or IVIG.  We discussed genetic testing for  neuropathy, but these tends to have a chronic and symmetric presentation.  They are scheduled to see [facility] in April for a second opinion.  Return to clinic in 2 months   The duration of this appointment visit was 40 minutes of face-to-face time with the patient.  Greater than 50% of this time was spent in counseling, explanation of diagnosis, planning of further management, and coordination of care.   Thank you for allowing me to participate in patient's care.  If I can answer any additional questions, I would be pleased to do so.    Sincerely,    Tyree Fluharty K. Allena Katz, DO

## 2016-02-03 NOTE — Telephone Encounter (Signed)
Form was signed & dated 01/30/16 & faxed on 02/02/16

## 2016-02-03 NOTE — Progress Notes (Signed)
Referral sent 

## 2016-02-08 IMAGING — XA DG FLUORO GUIDE LUMBAR PUNCTURE
1 series · 1 of 1 positions shown · non-contrast
Comparison: none

CLINICAL DATA: Left leg monoplegia. Neuropathic pain.
Polyradiculoneuropathy.

[Series 1: ortho standard · 1 of 1 slices shown]
[im 1/1]
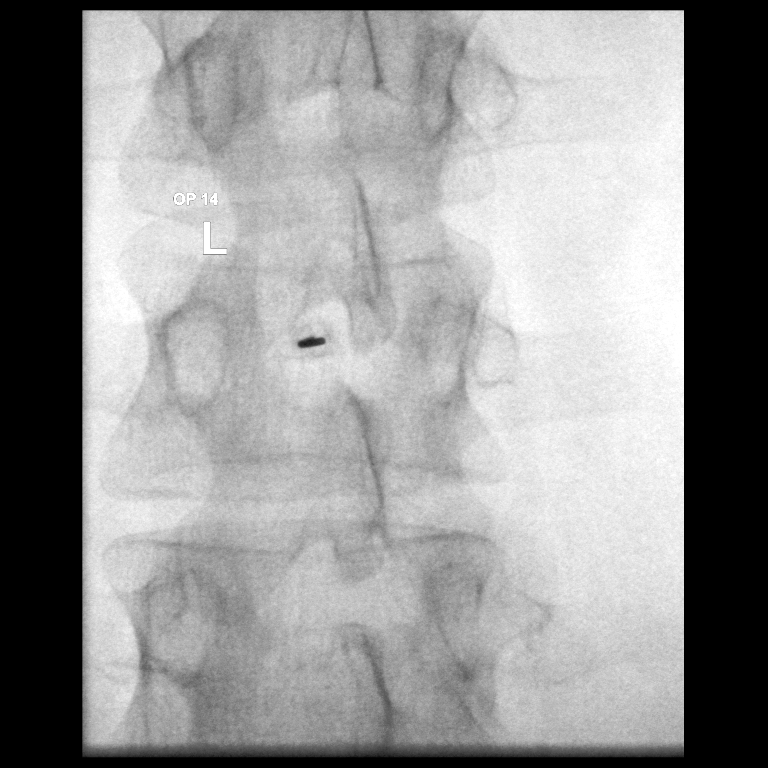

[1 of 1 positions shown; findings below may reference images not displayed]

EXAM:
DIAGNOSTIC LUMBAR PUNCTURE UNDER FLUOROSCOPIC GUIDANCE

FLUOROSCOPY TIME:  Radiation Exposure Index (as provided by the
fluoroscopic device): 17.23 microGray*m^2

Fluoroscopy Time (in minutes and seconds):  0 minutes 6 seconds

PROCEDURE:
Informed consent was obtained from the patient prior to the
procedure, including potential complications of headache, allergy,
and pain. With the patient prone, the lower back was prepped with
Betadine. 1% Lidocaine was used for local anesthesia. Lumbar
puncture was performed at the L2-3 level using a 6 inch, 20 gauge
needle via a left interlaminar approach with return of clear CSF
with an opening pressure of 14 cm water (measured in the left
lateral decubitus position). 10 ml of CSF were obtained for
laboratory studies.

The patient tolerated the procedure well. However, immediately
following completion of the procedure the patient experienced a
seizure with intermittent rhythmic head motion and extension of the
right arm. This lasted approximately 2 minutes, followed shortly by
another seizure of similar duration. The patient's vitals remained
stable, and the patient was transferred in a drowsy, postictal state
to the recovery area for monitoring. The patient's mentation
gradually improved in recovery and had returned to baseline at the
time of discharge without further seizure activity.
IMPRESSION: 1. Successful fluoroscopic guided lumbar puncture.
2. Seizure after completion of the procedure.

## 2016-02-10 ENCOUNTER — Telehealth: Payer: Self-pay | Admitting: Family Medicine

## 2016-02-10 ENCOUNTER — Encounter: Payer: Self-pay | Admitting: Neurology

## 2016-02-10 NOTE — Telephone Encounter (Signed)
Pt has very significant mental health issues, when we fill the meds because she no shows to behavioral health, that creates a disincentive for her to stick with the psychiatrist and their treatment plan. So no to that request also

## 2016-02-10 NOTE — Telephone Encounter (Signed)
Informed patient per Dr.Steve Luking- No, Dr.Steve Luking can not do narcotic prescriptions for Dr.Scott Luking. Patient verbalized understanding and asked that message please be passed along to [place]

## 2016-02-10 NOTE — Telephone Encounter (Signed)
Spoke with patient. Patient requesting refill on hydrocodone. Last hydrocodone 01/30/16 for a two week supply. Informed patient that Dr.Scott Wolfgang [place] is out of office today. Patient would like to know if Dr.Steve Luking could fill script for her due to being out. Also Patient states she missed her appointment for Behavioral health and is now out of Tegretol, Cymbalta, and Klonopin. She would like to know if Dr.Scott Luking can fill medications until she can get in with Dr.Ross. Please advise

## 2016-02-10 NOTE — Telephone Encounter (Signed)
It would be fine to do another 2 week prescription for the hydrocodone as prescribed before 2 weeks ago. As for the psychiatric medicines the patient needs to call psychiatry and see if they will give her a 2 or 4 week prescription until she can get in and be seen by them. Those medications are under the control of the psychiatrist.

## 2016-02-10 NOTE — Telephone Encounter (Signed)
Pt is wanting a refill on her HYDROcodone-acetaminophen (NORCO/VICODIN) 5-325 MG tablet. Pt is also requesting to speak with a nurse regarding a medication.

## 2016-02-10 NOTE — Telephone Encounter (Signed)
No, I cannot do narcotics for dr Nicki Reaper

## 2016-02-11 ENCOUNTER — Telehealth (HOSPITAL_COMMUNITY): Payer: Self-pay | Admitting: *Deleted

## 2016-02-11 ENCOUNTER — Other Ambulatory Visit (HOSPITAL_COMMUNITY): Payer: Self-pay | Admitting: Psychiatry

## 2016-02-11 ENCOUNTER — Other Ambulatory Visit: Payer: Self-pay | Admitting: Family Medicine

## 2016-02-11 MED ORDER — DULOXETINE HCL 60 MG PO CPEP: 60.0000 mg | ORAL_CAPSULE | Freq: Every day | ORAL | Status: DC

## 2016-02-11 MED ORDER — HYDROCODONE-ACETAMINOPHEN 5-325 MG PO TABS: 1.0000 | ORAL_TABLET | Freq: Four times a day (QID) | ORAL | Status: DC | PRN

## 2016-02-11 MED ORDER — CARBAMAZEPINE 200 MG PO TABS: ORAL_TABLET | ORAL | Status: DC

## 2016-02-11 NOTE — Telephone Encounter (Signed)
Tegretol sent. You may call in one month of clonazepam

## 2016-02-11 NOTE — Telephone Encounter (Signed)
Pt called stating she is out of her seizure medication, Cymbalta and Tegretol. Per pt, she was in the hospital at [facility] and she is not able to drive anymore and is home bound. Per pt, she lost feeling in her legs and some days she can't walk. Per pt, she currently have a nursing coming out to her house to assist her. Pt had appt scheduled for 01-21-16 but no showed and was last seen on 10-15-2015. Pt did agree to resch follow up appt due to no show but refused earlier appt office offered(02-19-16 morning). Per pt, she can only come during the afternoon. Pt is schedule for a follow up for 03-01-16.

## 2016-02-11 NOTE — Telephone Encounter (Signed)
phone call from patient, said she is out of her seizure medication, Cymbalta and Tegretol.   She said she was in the hospital at [facility], and she is not able to drive anymore.   She is homebound now.  She lost feeling in her leg, some days she can't walk.    She said she has a nurse coming out to assist her.

## 2016-02-11 NOTE — Telephone Encounter (Signed)
Called pt to inform her that script was sent to her pharmacy. Asked pt what she meant about seizure medication and she state when she was at [facility], they put her on Tegretol for Seizures. Per pt, can Dr. Harrington Challenger fill her Klonopin as well because she does not have any refills on it and her last fill was 01-12-16. Pt was last seen on 10-15-2015

## 2016-02-11 NOTE — Telephone Encounter (Signed)
cymbalta and tegretol sent in. I don't presribe "seizure medicine" What does she mean?

## 2016-02-11 NOTE — Telephone Encounter (Signed)
Spoke with patient and informed her per Dr.Scott Luking-It would be fine to do another 2 week prescription for the hydrocodone as prescribed before 2 weeks ago. As for the psychiatric medicines the patient needs to call psychiatry and see if they will give her a 2 or 4 week prescription until she can get in and be seen by them. Those medications are under the control of the psychiatrist. Patient verbalized understanding.

## 2016-02-12 ENCOUNTER — Telehealth (HOSPITAL_COMMUNITY): Payer: Self-pay | Admitting: *Deleted

## 2016-02-12 MED ORDER — CLONAZEPAM 0.5 MG PO TABS: 0.5000 mg | ORAL_TABLET | Freq: Three times a day (TID) | ORAL | Status: DC | PRN

## 2016-02-12 NOTE — Telephone Encounter (Signed)
Per Dr. Harrington Challenger to call in one months worth of Klonopin for pt to her pharmacy. Called pt pharmacy and spoke with Ovid Curd

## 2016-02-12 NOTE — Telephone Encounter (Signed)
lmtcb number provided 

## 2016-02-13 ENCOUNTER — Other Ambulatory Visit: Payer: Self-pay | Admitting: Neurosurgery

## 2016-02-16 ENCOUNTER — Telehealth: Payer: Self-pay | Admitting: Family Medicine

## 2016-02-16 NOTE — Telephone Encounter (Signed)
Sorry that she took a fall. Sorry she is having pain. I do not recommend different pain medication (given her complex history as well as history of pain medication) I would recommend supportive measures such as cool compresses if ongoing troubles may need x-rays.

## 2016-02-16 NOTE — Telephone Encounter (Signed)
Advised patient that Dr Nicki Reaper states he is sorry that she took a fall. Sorry she is having pain. Dr Nicki Reaper does not recommend different pain medication (given her complex history as well as history of pain medication) Dr Nicki Reaper would recommend supportive measures such as cool compresses if ongoing troubles may need x-rays. Patietn verbalized understanding.

## 2016-02-16 NOTE — Telephone Encounter (Signed)
Patient states she took a fall last week and fell on her left side. Patient states she is having back pain and left side pain. Patient states she is taking Hydrocodone 4 times a day, muscle relaxxer and lidocaine patche but it is not touching the pain at all.

## 2016-02-16 NOTE — Telephone Encounter (Signed)
Patient had a really bad fall last week.  She has tried the two different medications that Dr. Nicki Reaper gave her, but they are not working for pain.   [employer]

## 2016-02-17 IMAGING — CR DG CHEST 2V
2 series · 2 of 2 positions shown · non-contrast
Comparison: 09/12/2014

CLINICAL DATA: History of lupus.  Seizure today.

EXAM:
CHEST  2 VIEW

[w chest lat]
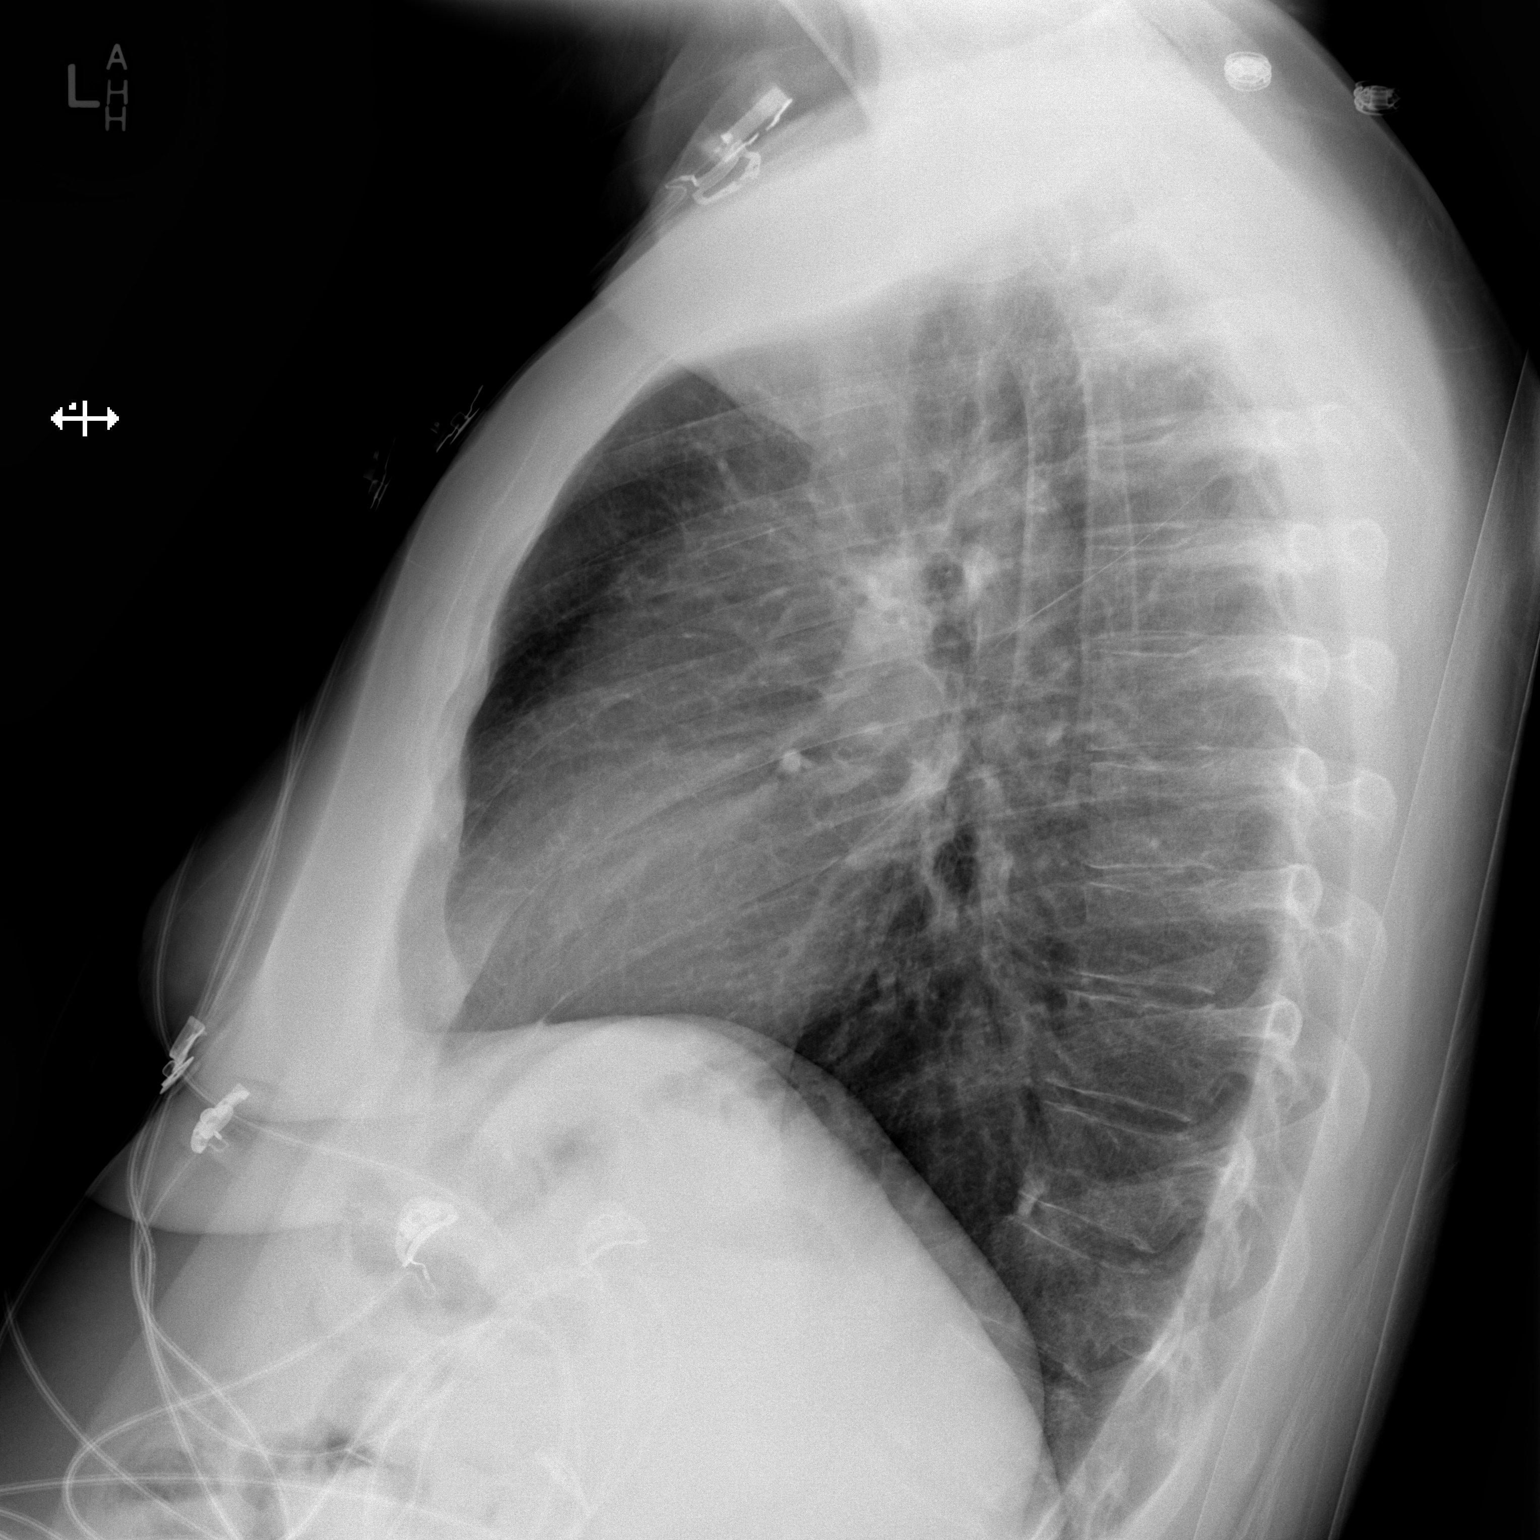

[x chest ap]
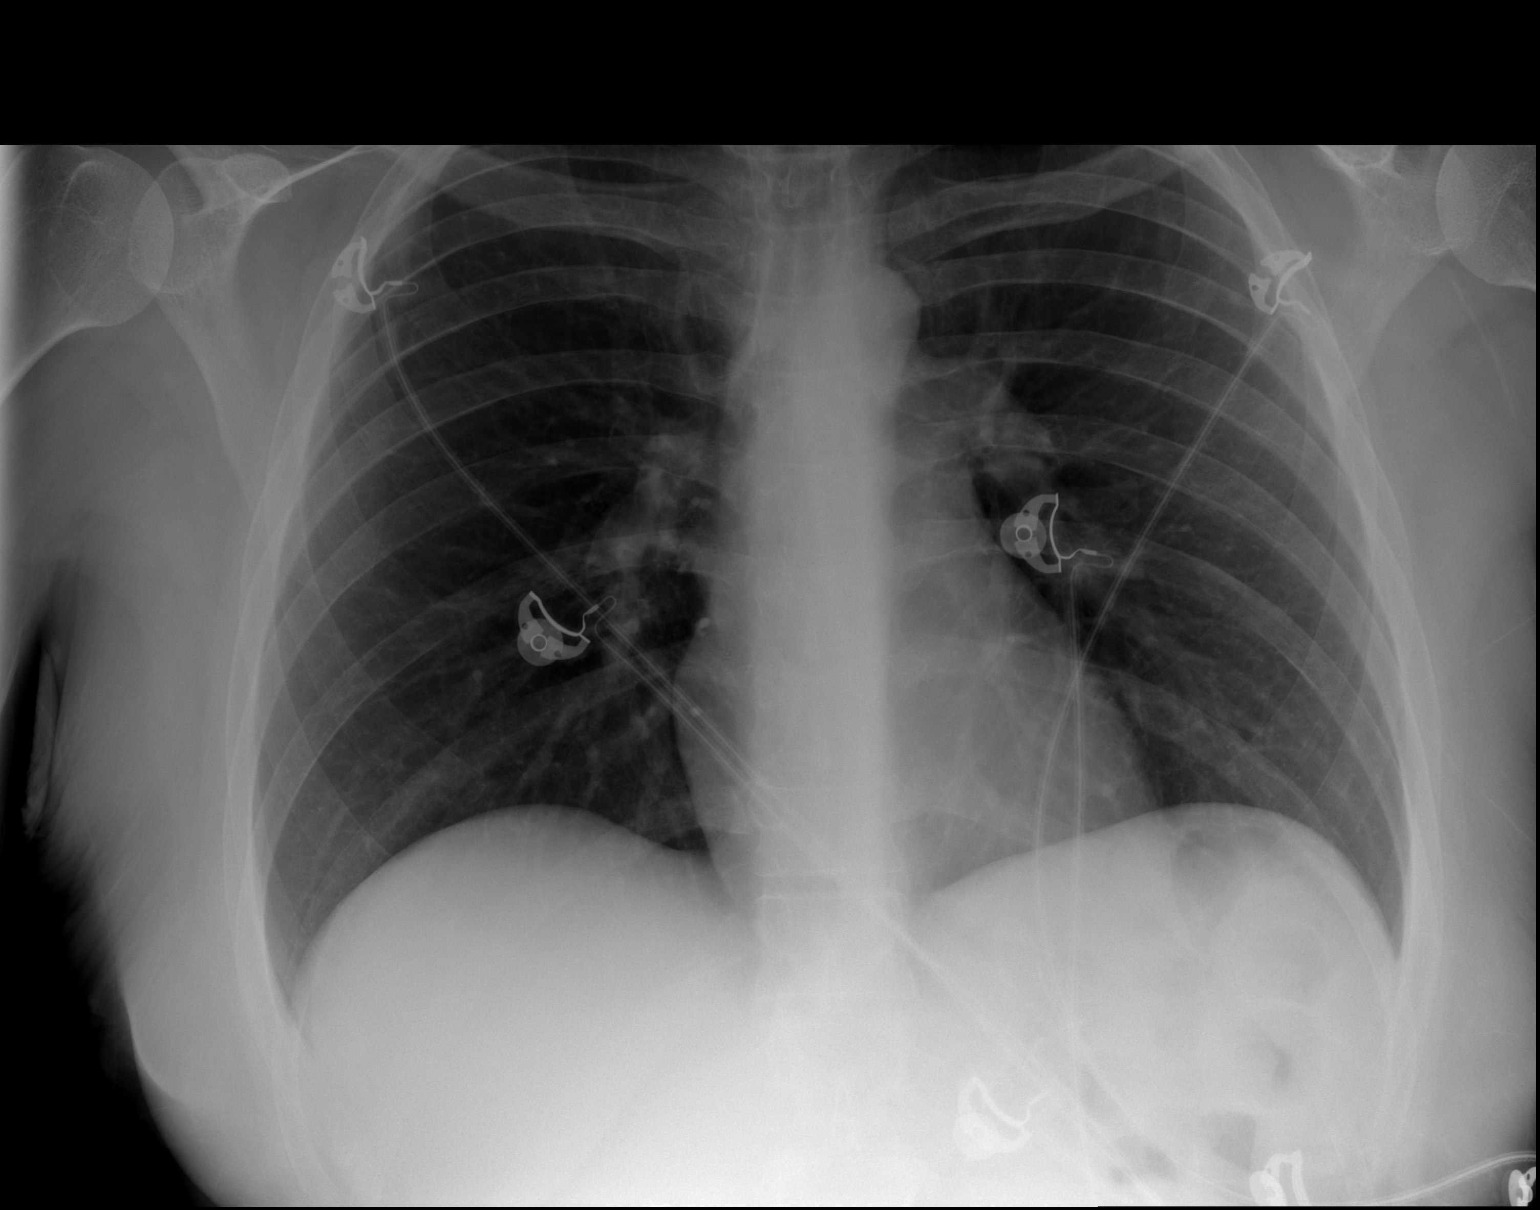

[2 of 2 positions shown; findings below may reference images not displayed]

FINDINGS: Cardiomediastinal silhouette is normal. Mediastinal contours appear
intact.

There is no evidence of focal airspace consolidation, pleural
effusion or pneumothorax.

Osseous structures are without acute abnormality. Soft tissues are
grossly normal.
IMPRESSION: No active cardiopulmonary disease.

## 2016-02-17 NOTE — Telephone Encounter (Signed)
Clonazepam called in 

## 2016-02-19 ENCOUNTER — Telehealth: Payer: Self-pay

## 2016-02-19 IMAGING — CR DG LUMBAR SPINE COMPLETE 4+V
5 series · 5 of 5 positions shown · non-contrast
Comparison: MRI of the lumbar spine performed 11/26/2015

CLINICAL DATA: Acute onset of lower back pain, status post fall.
Initial encounter.

EXAM:
LUMBAR SPINE - COMPLETE 4+ VIEW

[l-spine ap]
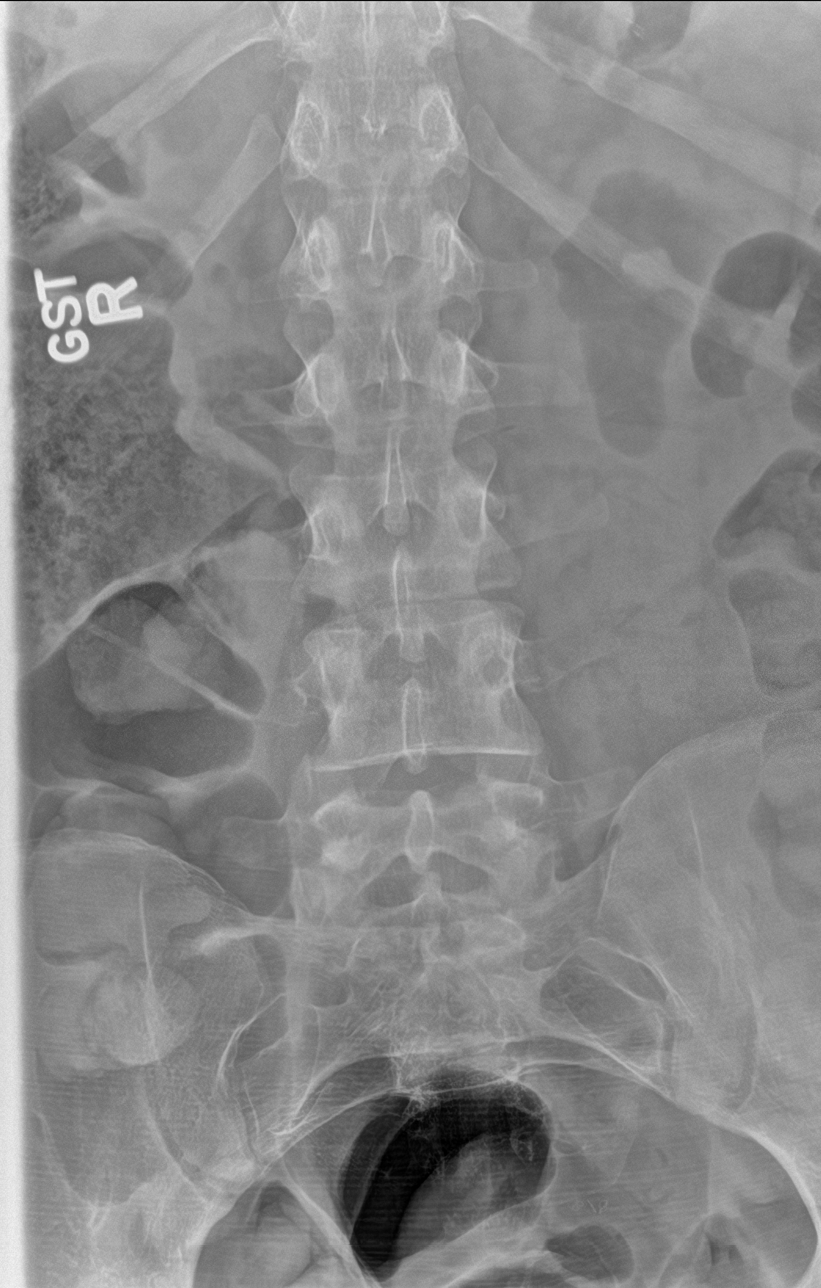

[l-spine obl (1 of 2)]
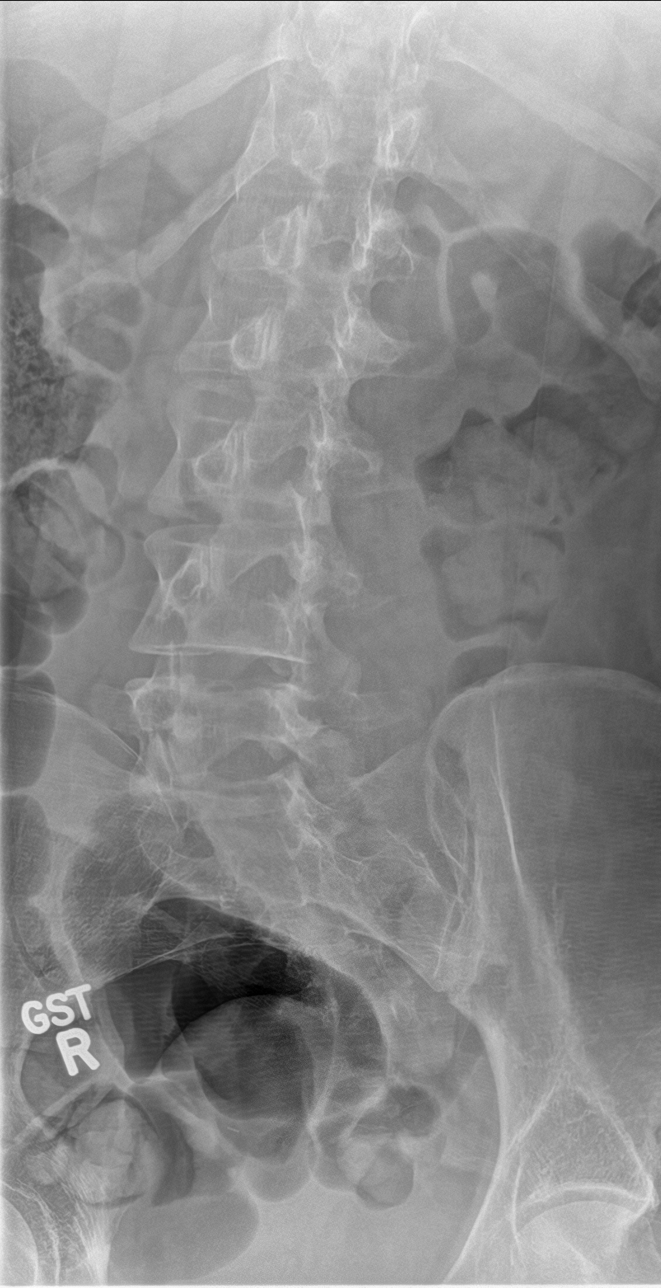

[l-spine obl (2 of 2)]
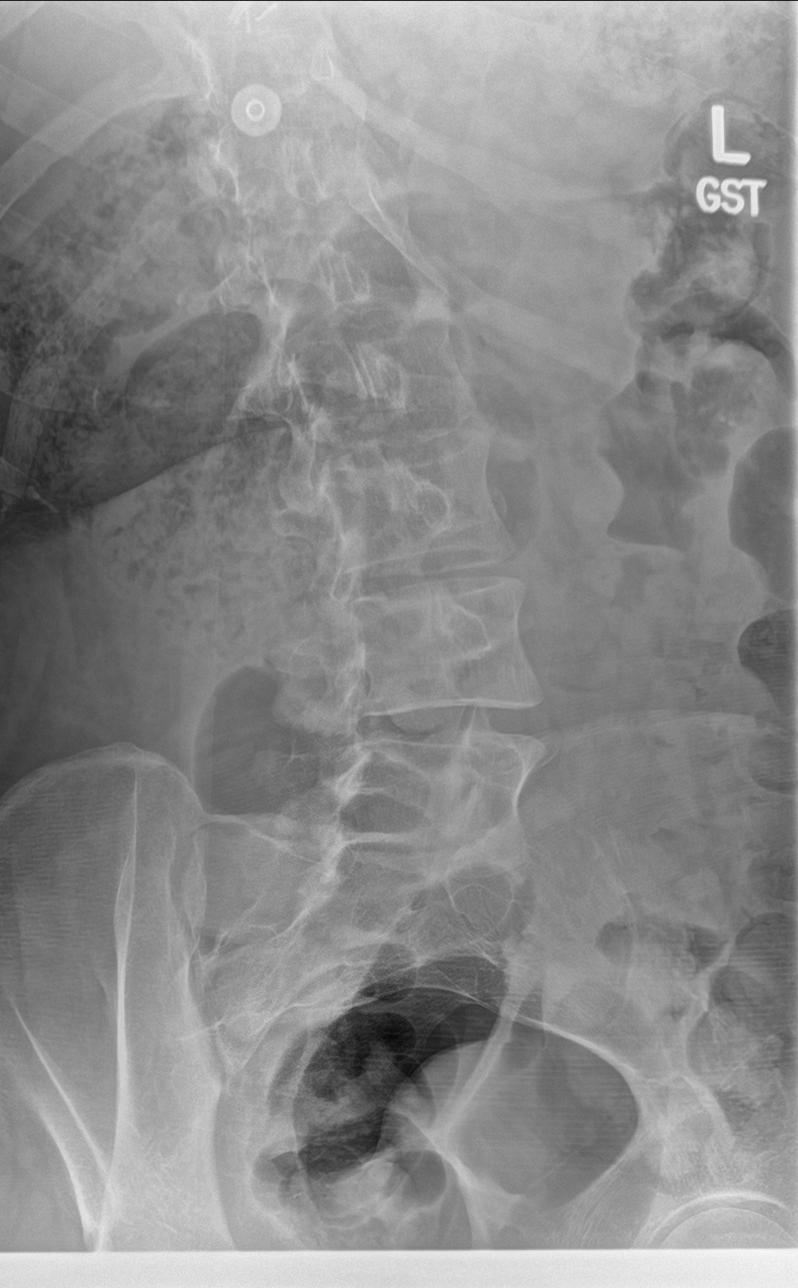

[l-spine lat]
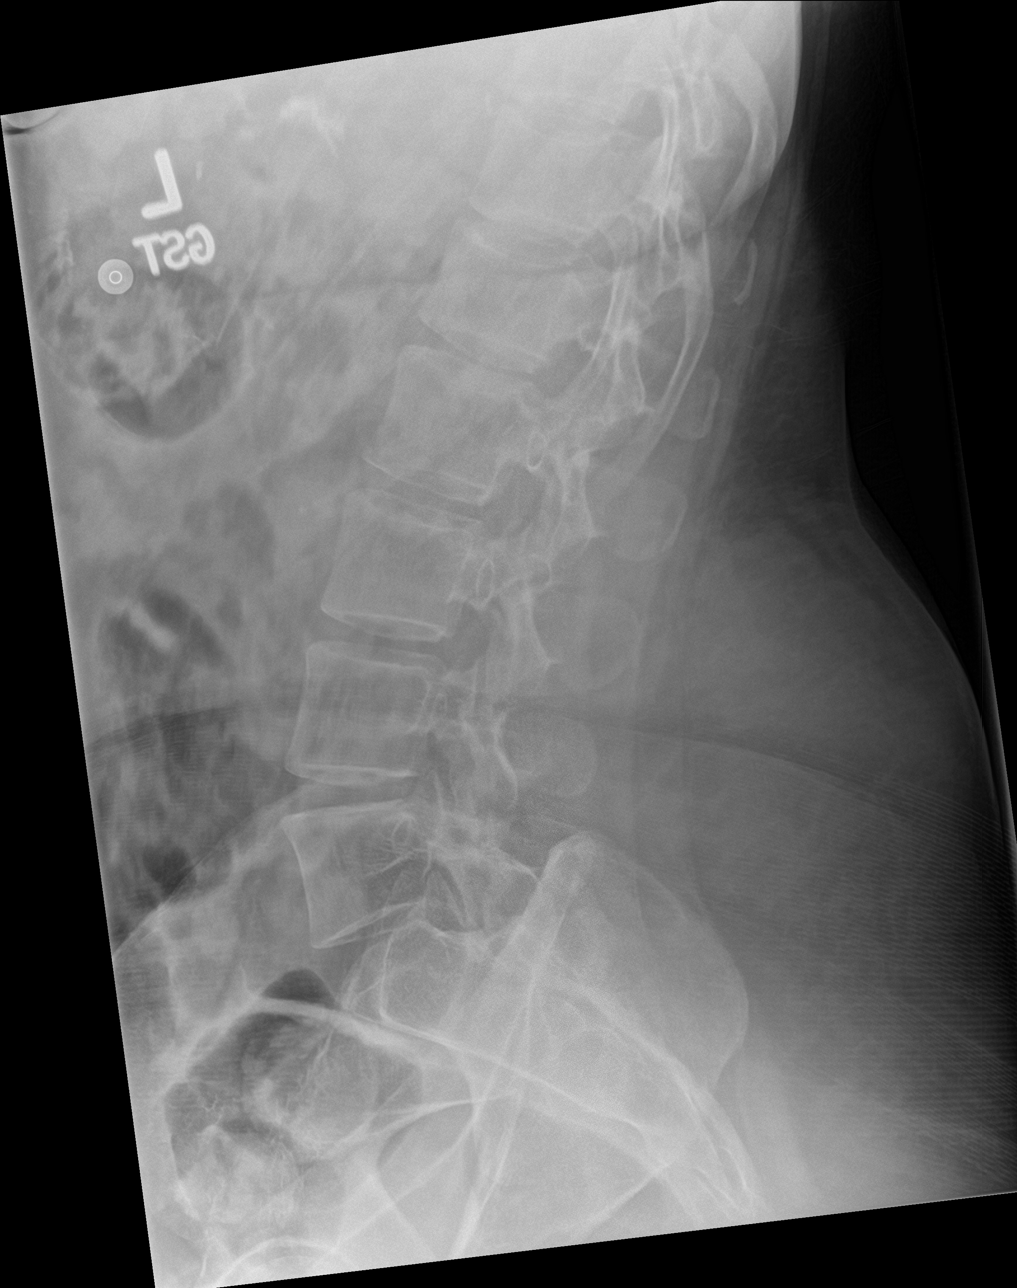

[l-spine spot]
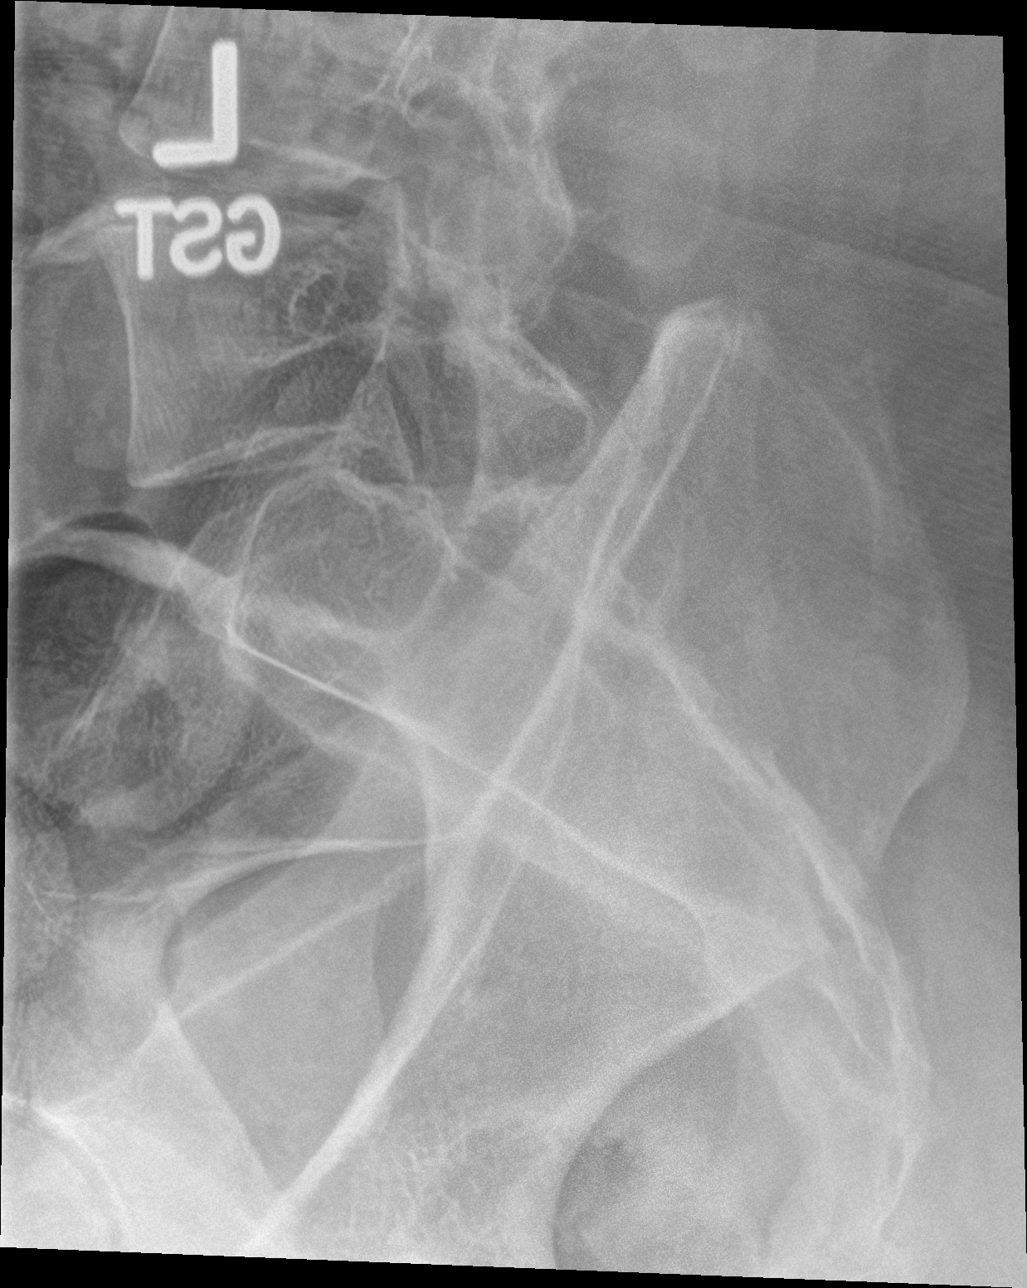

[5 of 5 positions shown; findings below may reference images not displayed]

FINDINGS: There is no evidence of fracture or subluxation. Vertebral bodies
demonstrate normal height and alignment. Intervertebral disc spaces
are preserved. The visualized neural foramina are grossly
unremarkable in appearance.

The visualized bowel gas pattern is unremarkable in appearance; air
and stool are noted within the colon. The sacroiliac joints are
within normal limits.
IMPRESSION: No evidence of fracture or subluxation along the lumbar spine.

## 2016-02-19 IMAGING — CR DG CERVICAL SPINE COMPLETE 4+V
5 series · 5 of 5 positions shown · non-contrast
Comparison: CT of the cervical spine performed 05/09/2015, and MRI
of the cervical spine performed 11/27/2015

CLINICAL DATA: Status post fall, with neck pain. Initial encounter.

EXAM:
CERVICAL SPINE - COMPLETE 4+ VIEW

[c-spine lat]
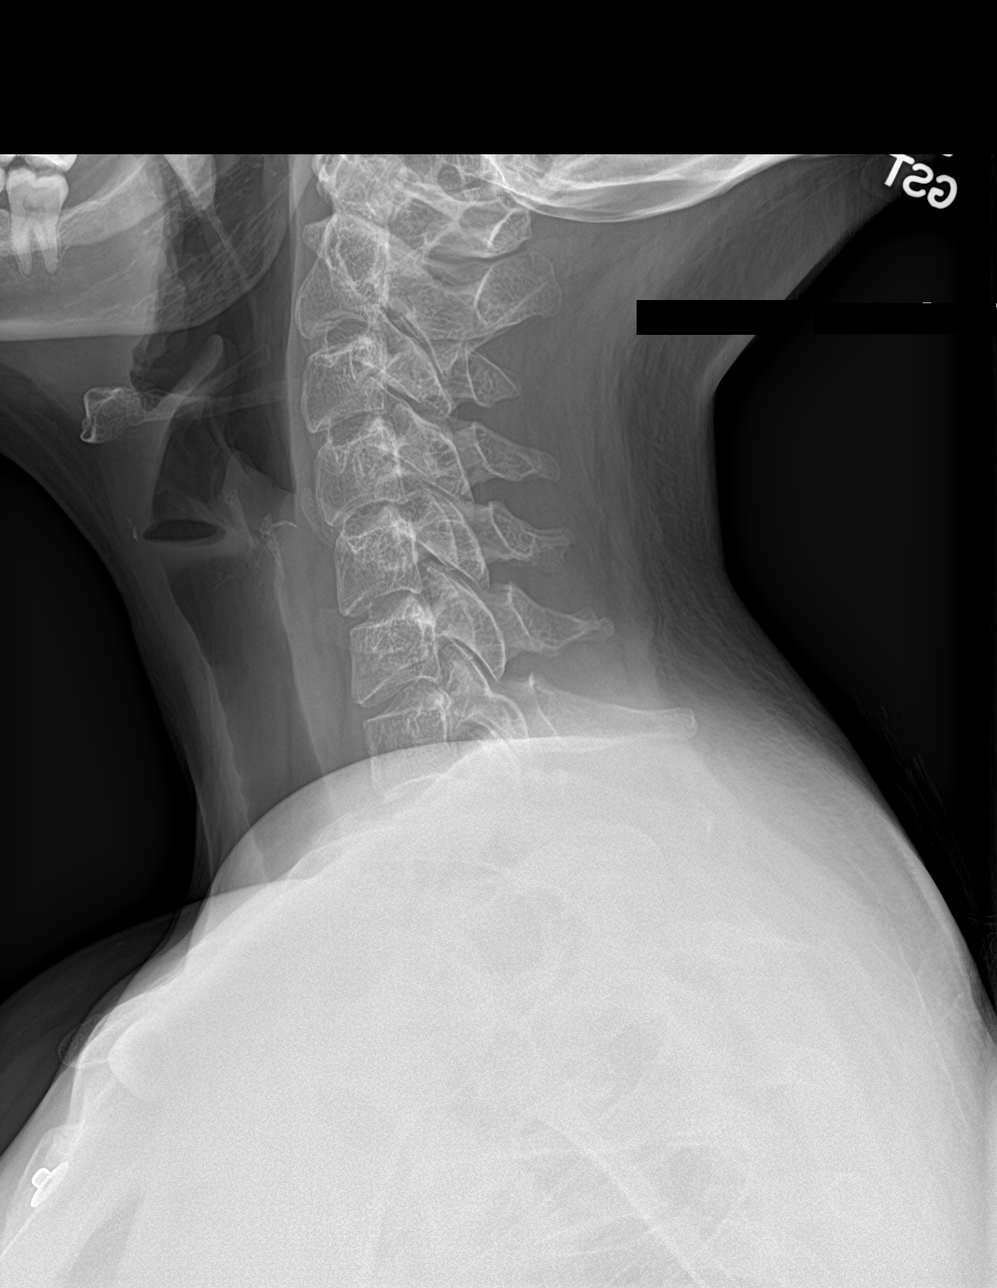

[c-spine obl (1 of 2)]
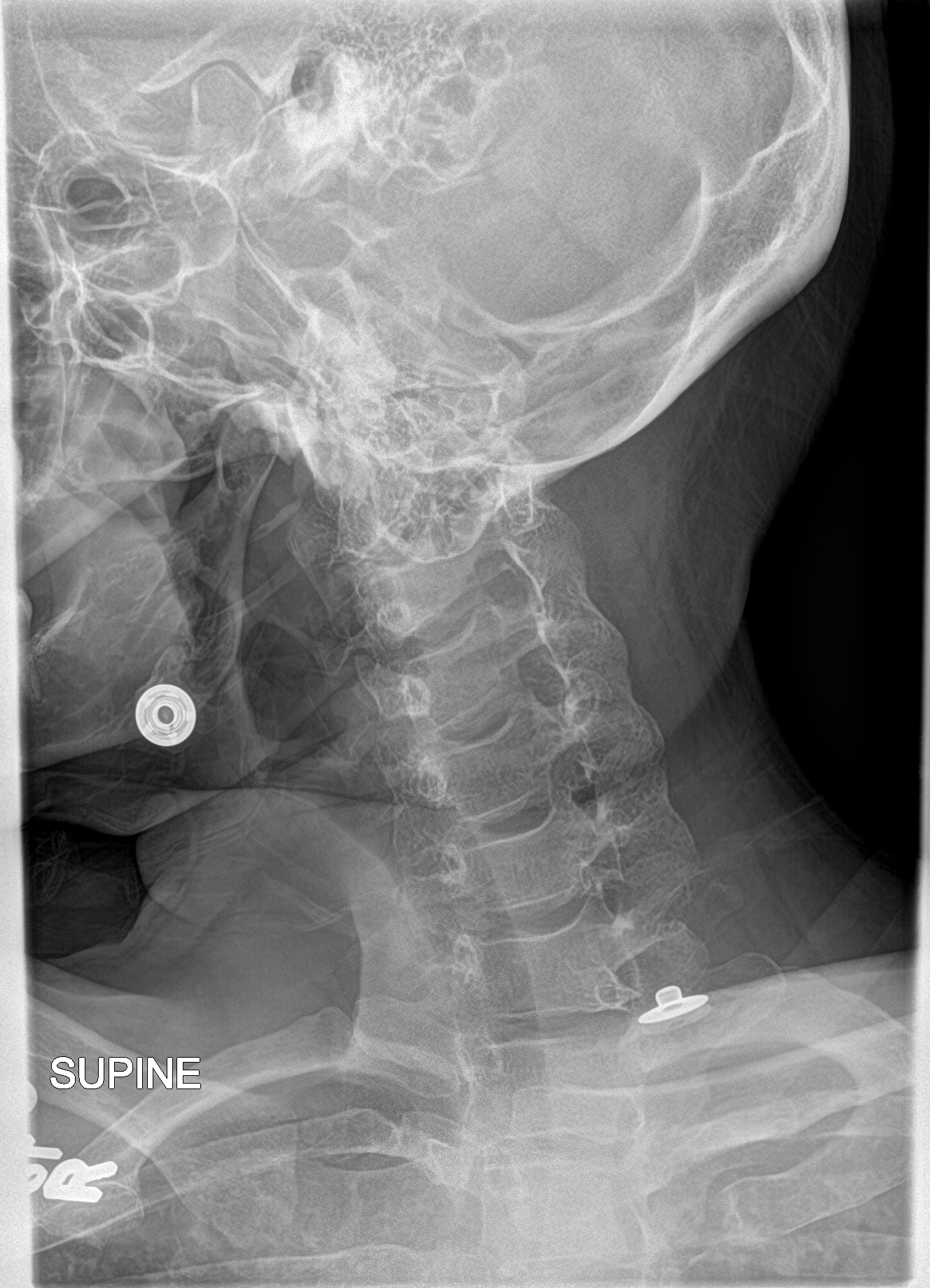

[c-spine obl (2 of 2)]
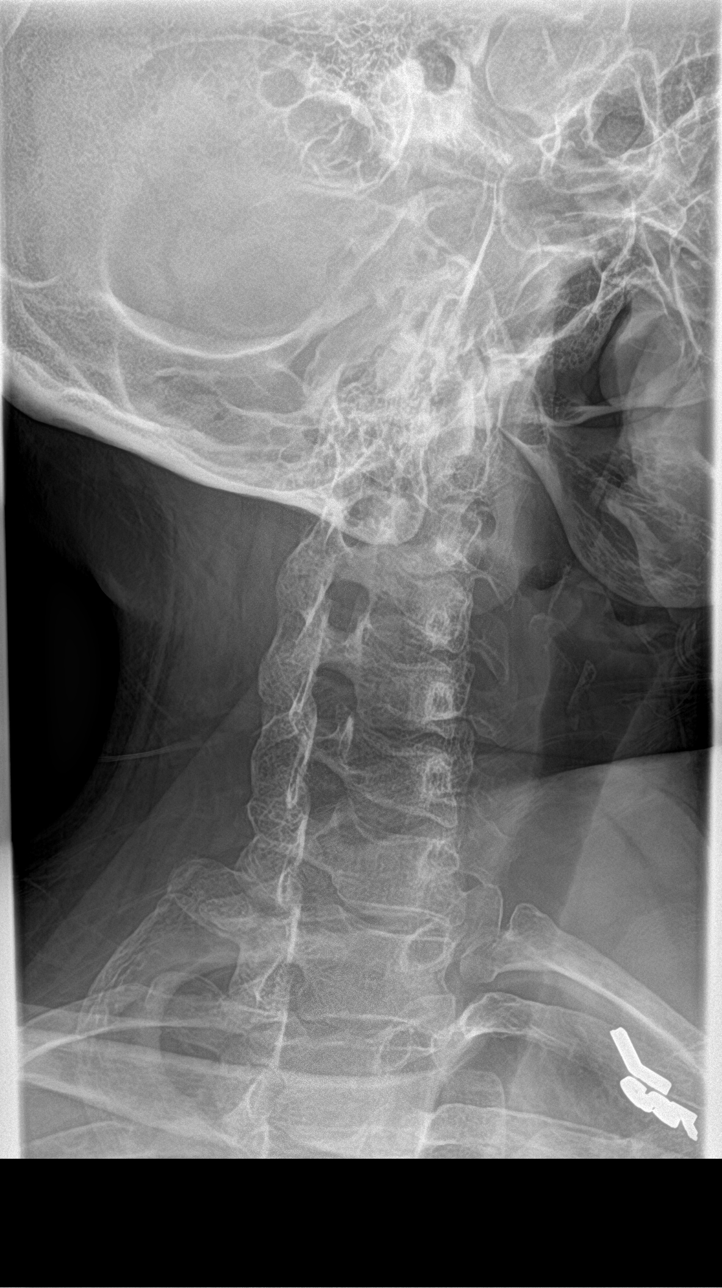

[c-spine ap]
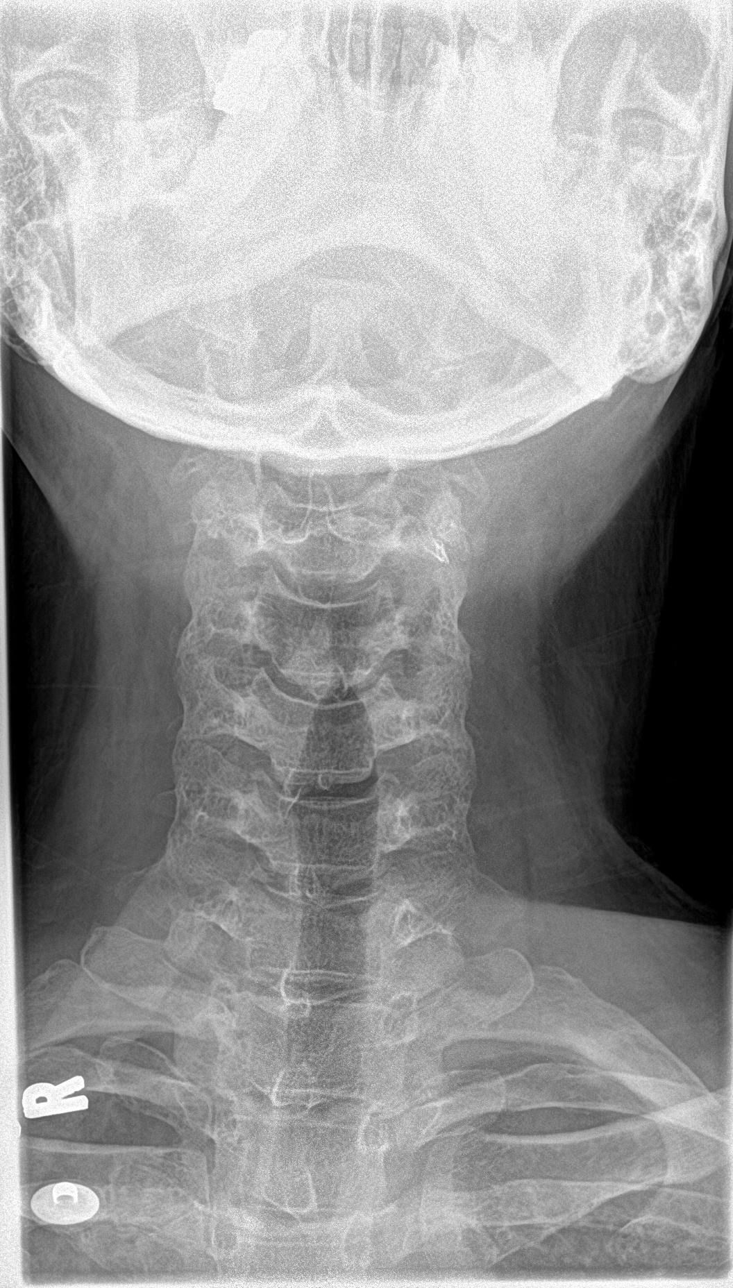

[c-spine open mouth]
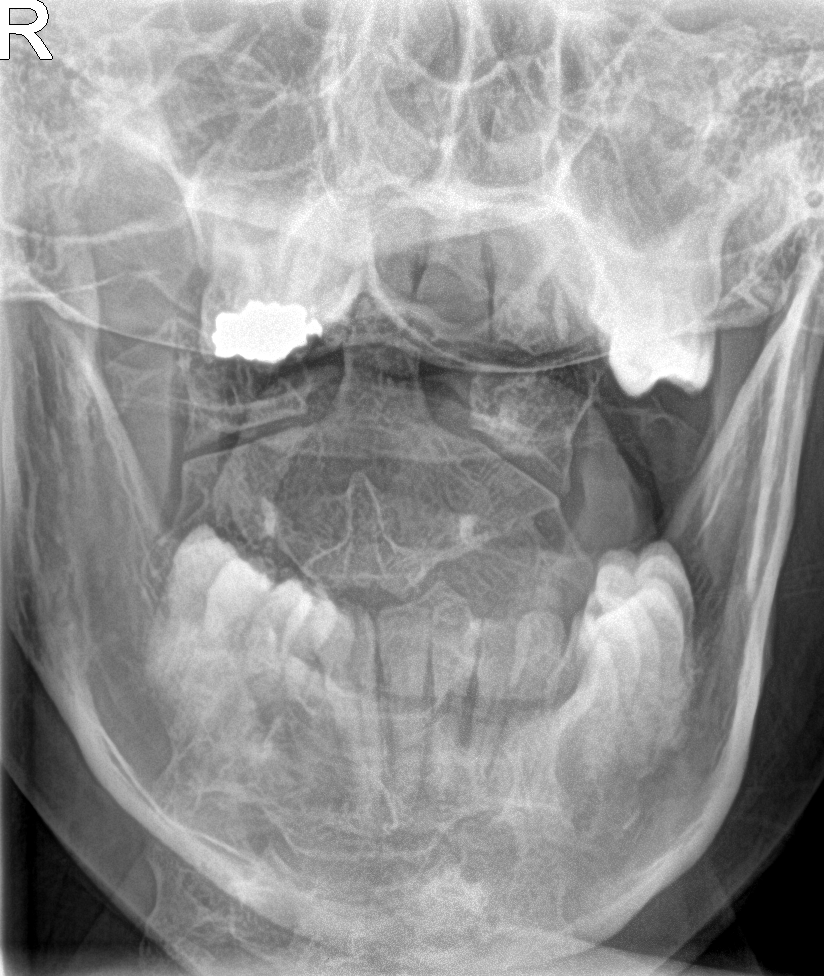

[5 of 5 positions shown; findings below may reference images not displayed]

FINDINGS: There is no evidence of fracture or subluxation. Vertebral bodies
demonstrate normal height and alignment. Intervertebral disc spaces
are preserved. Prevertebral soft tissues are within normal limits.
The provided odontoid view demonstrates no significant abnormality.

The visualized lung apices are clear.
IMPRESSION: No evidence of fracture or subluxation along the cervical spine.

## 2016-02-19 NOTE — Telephone Encounter (Signed)
Patient called and stated that it is going to take her longer to get into pain management due to [facility] card issues. Penny [name] has talked to the patient concerning this)  Patient wants something else prescribed for her pain because the hydrocodone 5/325 is not working and it is causing her to not be able to sleep. Patient cannot take tramadol because it is contraindicated with her seizure medication. Patient wants to know can she try the hydrocodone 10/325 mg.

## 2016-02-20 ENCOUNTER — Other Ambulatory Visit: Payer: Self-pay | Admitting: Family Medicine

## 2016-02-20 MED ORDER — HYDROCODONE-ACETAMINOPHEN 7.5-325 MG PO TABS: 1.0000 | ORAL_TABLET | Freq: Four times a day (QID) | ORAL | Status: DC | PRN

## 2016-02-20 NOTE — Telephone Encounter (Signed)
Left message to return call 

## 2016-02-20 NOTE — Telephone Encounter (Signed)
Patient notified She got 2 week supply on March 8, if hydrocodone kept her up then it is quite possible higher dose will do the same, Dr Nicki Reaper will allow Hydrocodone 7.5/325 one QID, # 56 this script may be filled March 22 ( it is a 2 week supply). Rx up front for patient pick up. Patient verbalized understanding.

## 2016-02-20 NOTE — Telephone Encounter (Signed)
She got 2 week supply on March 8, if hydrocodone kept her up then it is quite possible higher dose will do the same, I will allow Hydrocodone 7.5/325 one QID, # 56 this script may be filled March 22 ( it is a 2 week supply)

## 2016-02-24 ENCOUNTER — Other Ambulatory Visit: Payer: Self-pay

## 2016-02-24 ENCOUNTER — Emergency Department (HOSPITAL_COMMUNITY)
Admission: EM | Admit: 2016-02-24 | Discharge: 2016-02-24 | Disposition: A | Discharge status: Home / Self Care | Payer: Medicaid Other | Attending: Emergency Medicine | Admitting: Emergency Medicine

## 2016-02-24 ENCOUNTER — Encounter (HOSPITAL_COMMUNITY): Payer: Self-pay

## 2016-02-24 ENCOUNTER — Encounter (HOSPITAL_COMMUNITY)
Admission: RE | Admit: 2016-02-24 | Discharge: 2016-02-24 | Disposition: A | Discharge status: Home / Self Care | Payer: Medicaid Other | Source: Ambulatory Visit | Attending: Neurosurgery | Admitting: Neurosurgery

## 2016-02-24 ENCOUNTER — Encounter (HOSPITAL_COMMUNITY): Payer: Self-pay | Admitting: Emergency Medicine

## 2016-02-24 DIAGNOSIS — Z01818 Encounter for other preprocedural examination: Secondary | ICD-10-CM | POA: Insufficient documentation

## 2016-02-24 DIAGNOSIS — G43909 Migraine, unspecified, not intractable, without status migrainosus: Secondary | ICD-10-CM | POA: Diagnosis not present

## 2016-02-24 DIAGNOSIS — M791 Myalgia: Secondary | ICD-10-CM | POA: Insufficient documentation

## 2016-02-24 DIAGNOSIS — R569 Unspecified convulsions: Secondary | ICD-10-CM | POA: Insufficient documentation

## 2016-02-24 DIAGNOSIS — Z975 Presence of (intrauterine) contraceptive device: Secondary | ICD-10-CM | POA: Insufficient documentation

## 2016-02-24 DIAGNOSIS — Z8679 Personal history of other diseases of the circulatory system: Secondary | ICD-10-CM | POA: Insufficient documentation

## 2016-02-24 DIAGNOSIS — Z8719 Personal history of other diseases of the digestive system: Secondary | ICD-10-CM | POA: Diagnosis not present

## 2016-02-24 DIAGNOSIS — Z8742 Personal history of other diseases of the female genital tract: Secondary | ICD-10-CM | POA: Insufficient documentation

## 2016-02-24 DIAGNOSIS — F419 Anxiety disorder, unspecified: Secondary | ICD-10-CM | POA: Insufficient documentation

## 2016-02-24 DIAGNOSIS — Z87448 Personal history of other diseases of urinary system: Secondary | ICD-10-CM | POA: Diagnosis not present

## 2016-02-24 DIAGNOSIS — D509 Iron deficiency anemia, unspecified: Secondary | ICD-10-CM | POA: Insufficient documentation

## 2016-02-24 DIAGNOSIS — Z79899 Other long term (current) drug therapy: Secondary | ICD-10-CM | POA: Diagnosis not present

## 2016-02-24 DIAGNOSIS — Z91040 Latex allergy status: Secondary | ICD-10-CM | POA: Diagnosis not present

## 2016-02-24 DIAGNOSIS — R531 Weakness: Secondary | ICD-10-CM | POA: Insufficient documentation

## 2016-02-24 DIAGNOSIS — G629 Polyneuropathy, unspecified: Secondary | ICD-10-CM | POA: Insufficient documentation

## 2016-02-24 DIAGNOSIS — F909 Attention-deficit hyperactivity disorder, unspecified type: Secondary | ICD-10-CM | POA: Insufficient documentation

## 2016-02-24 DIAGNOSIS — F329 Major depressive disorder, single episode, unspecified: Secondary | ICD-10-CM | POA: Diagnosis not present

## 2016-02-24 DIAGNOSIS — R011 Cardiac murmur, unspecified: Secondary | ICD-10-CM | POA: Diagnosis not present

## 2016-02-24 DIAGNOSIS — Z8639 Personal history of other endocrine, nutritional and metabolic disease: Secondary | ICD-10-CM | POA: Diagnosis not present

## 2016-02-24 DIAGNOSIS — Z01812 Encounter for preprocedural laboratory examination: Secondary | ICD-10-CM | POA: Insufficient documentation

## 2016-02-24 DIAGNOSIS — G8929 Other chronic pain: Secondary | ICD-10-CM | POA: Insufficient documentation

## 2016-02-24 DIAGNOSIS — Z87891 Personal history of nicotine dependence: Secondary | ICD-10-CM | POA: Insufficient documentation

## 2016-02-24 DIAGNOSIS — Z9884 Bariatric surgery status: Secondary | ICD-10-CM | POA: Diagnosis not present

## 2016-02-24 HISTORY — DX: Drug abuse counseling and surveillance of drug abuser: Z71.51

## 2016-02-24 HISTORY — DX: Other specified postprocedural states: Z98.890

## 2016-02-24 HISTORY — DX: Angina pectoris, unspecified: I20.9

## 2016-02-24 HISTORY — DX: Opioid abuse, in remission: F11.11

## 2016-02-24 HISTORY — DX: Family history of other specified conditions: Z84.89

## 2016-02-24 LAB — COMPREHENSIVE METABOLIC PANEL
ALBUMIN: 4 g/dL (ref 3.5–5.0)
ALK PHOS: 79 U/L (ref 38–126)
ALT: 43 U/L (ref 14–54)
AST: 25 U/L (ref 15–41)
Anion gap: 12 (ref 5–15)
BILIRUBIN TOTAL: 0.5 mg/dL (ref 0.3–1.2)
BUN: 12 mg/dL (ref 6–20)
CO2: 19 mmol/L — ABNORMAL LOW (ref 22–32)
CREATININE: 1 mg/dL (ref 0.44–1.00)
Calcium: 9 mg/dL (ref 8.9–10.3)
Chloride: 112 mmol/L — ABNORMAL HIGH (ref 101–111)
GFR calc Af Amer: >60 mL/min (ref >60)
GLUCOSE: 101 mg/dL — AB (ref 65–99)
POTASSIUM: 4.2 mmol/L (ref 3.5–5.1)
Sodium: 143 mmol/L (ref 135–145)
TOTAL PROTEIN: 6.3 g/dL — AB (ref 6.5–8.1)

## 2016-02-24 LAB — CBC
HEMATOCRIT: 32.2 % — AB (ref 36.0–46.0)
HEMOGLOBIN: 10.4 g/dL — AB (ref 12.0–15.0)
MCH: 28.7 pg (ref 26.0–34.0)
MCHC: 32.3 g/dL (ref 30.0–36.0)
MCV: 88.7 fL (ref 78.0–100.0)
Platelets: 221 10*3/uL (ref 150–400)
RBC: 3.63 MIL/uL — AB (ref 3.87–5.11)
RDW: 18.5 % — ABNORMAL HIGH (ref 11.5–15.5)
WBC: 10.5 10*3/uL (ref 4.0–10.5)

## 2016-02-24 LAB — HCG, SERUM, QUALITATIVE: Preg, Serum: NEGATIVE

## 2016-02-24 LAB — CARBAMAZEPINE LEVEL, TOTAL: Carbamazepine Lvl: 6.8 ug/mL (ref 4.0–12.0)

## 2016-02-24 LAB — MAGNESIUM: Magnesium: 2.3 mg/dL (ref 1.7–2.4)

## 2016-02-24 MED ORDER — CEFAZOLIN SODIUM-DEXTROSE 2-3 GM-% IV SOLR
2.0000 g | INTRAVENOUS | Status: AC
  Administered 2016-02-25: 2 g via INTRAVENOUS
  Filled 2016-02-24: qty 50

## 2016-02-24 NOTE — Progress Notes (Signed)
Nurse called to EKG room because patient became pale, eye lids fluttering, pupils dilated, patient slow to respond.  Patient's vitals stable, O2 sats 100% on 2 liters.  NP called to room.  Report called to charge nurse in ED and patient transferred to ED.

## 2016-02-24 NOTE — ED Provider Notes (Signed)
CSN: [id number]     Arrival date & time 02/24/16  1555 History   First MD Initiated Contact with Patient 02/24/16 1603     Chief Complaint  Patient presents with  . Seizures      HPI 30 year old female presenting with altered mental status. 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 She was scheduled for a sural nerve biopsy and was meeting with anesthesiology for preoperative clearance when she is noted to have seizure-like activity involving all 4 extremities. It is unclear how long this lasted for. 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 She is not on any home AED's.  Approximately 5 minutes after patient arrival she was awake, alert, and her cognitive baseline. Reports being worked up for pseudoseizures at [employer], and states that these episodes of been occurring more and more frequently over the last 2 weeks, approximately every other day. She does not member the episodes when they happened. Denies falling to the ground or hitting her head when they occur. Other than generalized weakness and fatigue, she denies any additional symptoms at this time. No headache, fevers, neck pain, shortness of breath, chest pain, abdominal pain, nausea, vomiting. States that she's been taking her Tegretol as prescribed. Past Medical History  Diagnosis Date  . Migraines   . Interstitial cystitis   . IUD FEB 2010  . Gastritis JULY 2011  . Depression   . 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 Fatty liver   . Obesity (BMI 30-39.9) 2011 228 LBS BMI 36.8  . Iron deficiency anemia 07/23/2010  . Irritable bowel syndrome 2012 DIARRHEA    JUN 2012 TTG IgA 14.9  . Elevated liver enzymes JUL 2011ALK PHOS 111-127 AST  143-267 ALT  213-321T BILI 0.6  ALB  3.7-4.06 Jun 2011 ALK PHOS 118 AST 24 ALT 42 T BILI 0.4 ALB 3.9  . Chronic daily headache   . Ovarian cyst   . ADD (attention deficit disorder)   . RLQ abdominal pain 07/18/2013  . Menorrhagia 07/18/2013  . Anxiety   . Potassium (K) deficiency   . Dysmenorrhea 09/18/2013  . Stress 09/18/2013  . Patient desires pregnancy 09/18/2013  . Pregnant 12/25/2013  . Blood transfusion without reported diagnosis   . Heart murmur   . Gastric bypass status for obesity   . 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 Dysrhythmia   . Sciatica of left side 11/14/2014  . Fibromyalgia   . 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 PONV (postoperative nausea and vomiting) seizure post-operatively  . Psychiatric pseudoseizure   . Depression   . Lupus ([place])   . Family history of adverse reaction to anesthesia     'dad had to be kept on pump for breathing for morphine'  . History of Holter monitoring   . Anginal pain ([place])   . Hx of opioid abuse     for about 4 years, about 4 years ago  . Encounter for drug rehabilitation     at behavioral health for opioid addiction about 4 years ago   Past Surgical History  Procedure Laterality Date  . Gab  2007    in [place]-POUCH 5 CM  . Cholecystectomy  2005  biliary dyskinesia  . Cath self every nite      for sodium bicarb injection (discontinued 2013)  . Colonoscopy  JUN 2012 ABD PN/DIARRHEA WITH PROPOFOL    NL COLON  . Upper gastrointestinal endoscopy  JULY 2011 NAUSEA-D125,V6, PH 25    Bx; GASTRITIS, POUCH-5 CM LONG  . Dilation and curettage of uterus    . Tonsillectomy    . Gastric bypass  06/2006  . Savory dilation  06/20/2012    Dr. Barnie Alderman gastritis/Ulcer in the mid jejunum. Empiric dilation.   . Tonsillectomy and adenoidectomy    . Esophagogastroduodenoscopy    . Wisdom tooth extraction    . Repair vaginal cuff N/A 07/30/2014    Procedure: REPAIR VAGINAL CUFF;  Surgeon: Mora Bellman, MD;  Location: [facility];  Service:  Gynecology;  Laterality: N/A;  . Hysteroscopy w/d&c N/A 09/12/2014    Procedure: DILATATION AND CURETTAGE /HYSTEROSCOPY;  Surgeon: Jonnie Kind, MD;  Location: [facility];  Service: Gynecology;  Laterality: N/A;  . Esophagogastroduodenoscopy (egd) with propofol N/A 04/10/2015    Procedure: ESOPHAGOGASTRODUODENOSCOPY (EGD) WITH PROPOFOL;  Surgeon: Daneil Dolin, MD;  Location: [facility];  Service: Endoscopy;  Laterality: N/A;  . Esophageal dilation N/A 04/10/2015    Procedure: ESOPHAGEAL DILATION WITH 54FR MALONEY DILATOR;  Surgeon: Daneil Dolin, MD;  Location: [facility];  Service: Endoscopy;  Laterality: N/A;  . Biopsy  04/10/2015    Procedure: BIOPSY;  Surgeon: Daneil Dolin, MD;  Location: [facility];  Service: Endoscopy;;   Family History  Problem Relation Age of Onset  . Hemochromatosis Maternal Grandmother   . Migraines Maternal Grandmother   . Cancer Maternal Grandmother   . Breast cancer Maternal Grandmother   . Hypertension Father   . Diabetes Father   . Coronary artery disease Father   . Migraines Paternal Grandmother   . Breast cancer Paternal Grandmother   . Cancer Mother     breast  . Hemochromatosis Mother   . Breast cancer Mother   . Depression Mother   . Anxiety disorder Mother   . Coronary artery disease Paternal Grandfather   . Anxiety disorder Brother   . Bipolar disorder Brother   . Healthy Son   . Healthy Daughter    Social History  Substance Use Topics  . Smoking status: Former Smoker -- 0.25 packs/day for 6 years    Types: Cigarettes    Quit date: 11/29/2015  . Smokeless tobacco: Never Used  . Alcohol Use: 0.0 oz/week    0 Standard drinks or equivalent per week     Comment: once every weeks - maybe a beer with a dinner, rarely   OB History    Gravida Para Term Preterm AB TAB SAB Ectopic Multiple Living   _0 Review of Systems  Constitutional: Negative for fever, chills, activity change and appetite change.  HENT: Negative for congestion,  rhinorrhea and sore throat.   Eyes: Negative for visual disturbance.  Respiratory: Negative for cough, shortness of breath and wheezing.   Cardiovascular: Negative for chest pain and palpitations.  Gastrointestinal: Negative for nausea, vomiting, abdominal pain, diarrhea, constipation, blood in stool and abdominal distention.  Genitourinary: Negative for dysuria, frequency and flank pain.  Musculoskeletal: Positive for myalgias (chronic, at baselline). Negative for back pain, joint swelling, arthralgias, neck pain and neck stiffness.  Skin: Negative for rash.  Neurological: Positive for seizures (seizure-like activity) and weakness (left-sided, chronic, at baseline). Negative  for dizziness, tremors, syncope, facial asymmetry, speech difficulty, numbness and headaches.  Psychiatric/Behavioral: Negative for behavioral problems, confusion and agitation.      Allergies  Gabapentin; Metoclopramide hcl; Tramadol; Morphine and related; Propofol; Trazodone and nefazodone; Zofran; Latex; and Tape  Home Medications   Prior to Admission medications   Medication Sig Start Date End Date Taking? Authorizing Provider  amphetamine-dextroamphetamine (ADDERALL XR) 30 MG 24 hr capsule Take 1 capsule (30 mg total) by mouth daily. 10/15/15   Cloria Spring, MD  APAP-Pamabrom-Pyrilamine (MENSTRUAL PAIN RELIEF) 500-25-15 MG TABS Take 1-2 tablets by mouth daily as needed (for pain).    Historical Provider, MD  carbamazepine (TEGRETOL) 200 MG tablet Take two tablets at bedtime Patient taking differently: Take 400 mg by mouth at bedtime. Take two tablets at bedtime 02/11/16   Cloria Spring, MD  clonazePAM (KLONOPIN) 0.5 MG tablet Take 1 tablet (0.5 mg total) by mouth 3 (three) times daily as needed for anxiety. 02/12/16 02/11/17  Cloria Spring, MD  DULoxetine (CYMBALTA) 60 MG capsule Take 1 capsule (60 mg total) by mouth daily. 02/11/16   Cloria Spring, MD  folic acid (FOLVITE) 500 MCG tablet Take 800 mcg by mouth at  bedtime.    Historical Provider, MD  HYDROcodone-acetaminophen (NORCO) 7.5-325 MG tablet Take 1 tablet by mouth every 6 (six) hours as needed. Patient taking differently: Take 1 tablet by mouth every 6 (six) hours as needed for moderate pain.  02/20/16   Kathyrn Drown, MD  tiZANidine (ZANAFLEX) 4 MG tablet Take 1 tablet (4 mg total) by mouth 3 (three) times daily as needed for muscle spasms. 01/30/16   Kathyrn Drown, MD  topiramate (TOPAMAX) 50 MG tablet TAKE 1 TABLET BY MOUTH EACH MORNING AND 2 TABLETS IN THE EVENING AS NEEDED FOR MIGRAINES. 02/11/16   Kathyrn Drown, MD  traZODone (DESYREL) 150 MG tablet Take 1 tablet (150 mg total) by mouth at bedtime as needed for sleep. Patient not taking: Reported on 02/23/2016 10/15/15   Cloria Spring, MD   BP 108/61 mmHg  Pulse 69  Resp 16  SpO2 90%  LMP 02/19/2016 (Exact Date) Physical Exam  Constitutional: She is oriented to person, place, and time. She appears well-developed and well-nourished. No distress.  HENT:  Head: Normocephalic and atraumatic.  Right Ear: External ear normal.  Left Ear: External ear normal.  Nose: Nose normal.  Mouth/Throat: Oropharynx is clear and moist. No oropharyngeal exudate.  Eyes: Conjunctivae and EOM are normal. Pupils are equal, round, and reactive to light. Right eye exhibits no discharge. Left eye exhibits no discharge.  Neck: Normal range of motion. Neck supple.  Cardiovascular: Normal rate, regular rhythm and normal heart sounds.  Exam reveals no gallop and no friction rub.   No murmur heard. Pulmonary/Chest: Breath sounds normal. No respiratory distress. She has no wheezes. She has no rales.  Abdominal: Soft. Bowel sounds are normal. She exhibits no distension and no mass. There is no tenderness. There is no rebound and no guarding.  Musculoskeletal: Normal range of motion. She exhibits no edema or tenderness.  Neurological: She is alert and oriented to person, place, and time. She exhibits normal muscle tone.   Face symmetric, tongue midline. Cranial nerves intact. 5 out of 5 strength in the right upper extremity and right lower external E. 4-5 strength in the left upper chest and left lower cavity. Intact sensation to light touch in all 4 extremities. Normal finger to nose and rapid alternating movements.  Normal speech. Gait at baseline.   Skin: Skin is warm and dry. No rash noted. She is not diaphoretic.  Psychiatric: She has a normal mood and affect. Her behavior is normal. Judgment and thought content normal.    ED Course  Procedures (including critical care time) Labs Review Labs Reviewed  CARBAMAZEPINE LEVEL, TOTAL  MAGNESIUM    Imaging Review No results found. I have personally reviewed and evaluated these images and lab results as part of my medical decision-making.   EKG Interpretation None      MDM   Final diagnoses:  Seizure-like activity ([place])   On arrival in the ED the patient will turn her head to voice and open her eyes to commands but will not follow other commands or speak with me. Some subtle tremor noted to her bilateral upper extremity is. After approximately 5 minutes, and with some coaxing, the patient began to answer questions. 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 Patient has had extensive workup in the past for this. As she did not hit her head today, is at her neurologic baseline, and denies HA, CT imaging is not indicated. CBC, CMP, beta-HCG, and tegretol levels unremarkable.   Prior to discharge, I spoke with the patient's father who reports that whenever she becomes "stressed out" her seizure-like episodes occur more frequently. She has been nervous about this upcoming procedure tomorrow and he suspects that this may have played a role.  I touched base with preop, and patient is still  scheduled for her procedure tomorrow. I have instructed her to present to the hospital as scheduled. I recommend follow-up with her PCP and neurologist for continued seizure activity and return to the ED for any activity that is outside of normal for her. She is stable for discharge home.    Zipporah Plants, MD 02/24/16 [place], MD 02/28/16 [phone]

## 2016-02-24 NOTE — ED Notes (Signed)
MD at bedside. 

## 2016-02-24 NOTE — Progress Notes (Signed)
Received call from EKG room that patient appeared to pass out during EKG.  Patient had been interviewed after PAT appt by Willeen Cass NP for reporting SOB.  Patient appeared comfortable and stable during PAT appt, alert, oriented, and in NAD.  Upon entering EKG room other RNs and Willeen Cass NP were in room, patient was hooked up to EKG machine, oxygen on 2L, and vitals were stable.  Other RNs stated patient's pupils were slightly dilated.  Lindsi Forte RN called report to ED. Transported patient to ED without any issues and transferred care of patient to ED CN.

## 2016-02-24 NOTE — Pre-Procedure Instructions (Signed)
Penny [name]  02/24/2016      [facility] - [place], [place]  [address] Phone: [phone] Fax: [phone]    Your procedure is scheduled on February 25 2016.  Report to [facility] Admitting at 7 A.M.  Call this number if you have problems the morning of surgery:  [phone]   Remember:  Do not eat food or drink liquids after midnight tonight.  Take these medicines the morning of surgery with A SIP OF WATER clonazepam (klonopin) if needed, duloxetine (cymbalta), hydrocodone-acetaminophen (norco) if needed, tizanidine (zanaflex) if needed, topiramate (topamax) if needed  STOP: ALL Vitamins, Supplements, Effient and Herbal Medications, Fish Oils, Aspirins, NSAIDs (Nonsteroidal Anti-inflammatories such as Ibuprofen, Aleve, or Advil), and Goody's/BC Powders today.   Do not wear jewelry, make-up or nail polish.  Do not wear lotions, powders, or perfumes.  You may wear deodorant.  Do not shave 48 hours prior to surgery.  Men may shave face and neck.  Do not bring valuables to the hospital.  [facility] is not responsible for any belongings or valuables.  Contacts, dentures or bridgework may not be worn into surgery.  Leave your suitcase in the car.  After surgery it may be brought to your room.  For patients admitted to the hospital, discharge time will be determined by your treatment team.  Patients discharged the day of surgery will not be allowed to drive home.        Preparing for Surgery at [facility]  Before surgery, you can play an important role.  Because skin is not sterile, your skin needs to be as free of germs as possible.  You can reduce the number of germs on your skin by washing with CHG (chlorahexidine gluconate) Soap before surgery.  CHG is an antiseptic cleaner with kills germs and bonds with the skin to continue killing germs even after washing.   Please do not use if you have an allergy to CHG or  antibacterial soaps.  If your skin becomes reddened/irritated stop using the CHG.  Do not shave (including legs and underarms) for at least 48 hours prior to first CHG shower.  It is okay to shave your face.  Please follow these instructions carefully:  1. Shower with CHG Soap the night before surgery and the morning of Surgery. 2. If you choose to wash your hair, wash your hair first as usual with your normal shampoo. 3. After you shampoo, rinse your hair and body thoroughly to remove the Shampoo. 4. Use CHG as you would any other liquid soap. You can apply chg directly to the skin and wash gently with scrungie or a clean washcloth. 5. Apply the CHG Soap to your body ONLY FROM THE NECK DOWN. Do not use on open wounds or open sores. Avoid contact with your eyes, ears, mouth and genitals (private parts). Wash genitals (private parts) with your normal soap. 6. Wash thoroughly, paying special attention to the area where your surgery will be performed. 7. Thoroughly rinse your body with warm water from the neck down. 8. DO NOT shower/wash with your normal soap after using and rinsing off the CHG Soap. 9. Pat yourself dry with a clean towel.  10. Wear clean pajamas.  11. Place clean sheets on your bed the night of your first shower and do not sleep with pets.  Day of Surgery  Do not apply any lotions/deodorants the morning of surgery. Please  wear clean clothes to the hospital/surgery center.   Please read over the following fact sheets that you were given. Pain Booklet, Coughing and Deep Breathing and Surgical Site Infection Prevention

## 2016-02-24 NOTE — ED Notes (Signed)
Pt arrives from short stay who report pt had seizure-like activity, lasting unknown time. No oral trauma noted, no incontinence.  Resp e/u.

## 2016-02-24 NOTE — Discharge Instructions (Signed)
Nonepileptic Seizures °Nonepileptic seizures are seizures that are not caused by abnormal electrical signals in your brain. These seizures often seem like epileptic seizures, but they are not caused by epilepsy.  °There are two types of nonepileptic seizures: °· A physiologic nonepileptic seizure results from a disruption in your brain. °· A psychogenic seizure results from emotional stress. These seizures are sometimes called pseudoseizures. °CAUSES  °Causes of physiologic nonepileptic seizures include:  °· Sudden drop in blood pressure. °· Low blood sugar. °· Low levels of salt (sodium) in your blood. °· Low levels of calcium in your blood. °· Migraine. °· Heart rhythm problems. °· Sleep disorders. °· Drug and alcohol abuse. °Common causes of psychogenic nonepileptic seizures include: °· Stress. °· Emotional trauma. °· Sexual or physical abuse. °· Major life events, such as divorce or the death of a loved one. °· Mental health disorders, including panic attack and hyperactivity disorder. °SIGNS AND SYMPTOMS °A nonepileptic seizure can look like an epileptic seizure, including uncontrollable shaking (convulsions), or changes in attention, behavior, or the ability to remain awake and alert. However, there are some differences. Nonepileptic seizures usually: °· Do not cause physical injuries. °· Start slowly. °· Include crying or shrieking. °· Last longer than 2 minutes. °· Have a short recovery time without headache or exhaustion. °DIAGNOSIS  °Your health care provider can usually diagnose nonepileptic seizures after taking your medical history and giving you a physical exam. Your health care provider may want to talk to your friends or relatives who have seen you have a seizure.  °You may also need to have tests to look for causes of physiologic nonepileptic seizures. This may include an electroencephalogram (EEG), which is a test that measures electrical activity in your brain. If you have had an epileptic  seizure, the results of your EEG will be abnormal. If your health care provider thinks you have had a psychogenic nonepileptic seizure, you may need to see a mental health specialist for an evaluation. °TREATMENT  °Treatment depends on the type and cause of your seizures. °· For physiologic nonepileptic seizures, treatment is aimed at addressing the underlying condition that caused the seizures. These seizures usually stop when the underlying condition is properly treated. °· Nonepileptic seizures do not respond to the seizure medicines used to treat epilepsy. °· For psychogenic seizures, you may need to work with a mental health specialist. °HOME CARE INSTRUCTIONS °Home care will depend on the type of nonepileptic seizures you have.  °· Follow all your health care provider's instructions. °· Keep all your follow-up appointments. °SEEK MEDICAL CARE IF: °You continue to have seizures after treatment. °SEEK IMMEDIATE MEDICAL CARE IF: °· Your seizures change or become more frequent. °· You injure yourself during a seizure. °· You have one seizure after another. °· You have trouble recovering from a seizure. °· You have chest pain or trouble breathing. °MAKE SURE YOU: °· Understand these instructions. °· Will watch your condition. °· Will get help right away if you are not doing well or get worse. °  °This information is not intended to replace advice given to you by your health care provider. Make sure you discuss any questions you have with your health care provider. °  °Document Released: 01/07/2006 Document Revised: 12/13/2014 Document Reviewed: 09/18/2013 °Elsevier Interactive Patient Education ©2016 [employer] ° °

## 2016-02-24 NOTE — Progress Notes (Signed)
Anesthesia consult:   Pt is a 30 year old female scheduled for sural nerve biopsy on 02/25/2016 with Dr. Sherwood Gambler.   PMH includes:  Psychiatric pseudoseizures, idiopathic peripheral neuropathy, anemia, fatty liver, probable small PFO, post-op N/V. Former smoker. BMI 32. S/p gastric bypass.   Medications include: amphetamine-dextroamphetamine, carbamazepine, clonazepam, cymbalta, zanaflex, topamax.   Preoperative labs reviewed.    Chest x-ray 12/28/15 reviewed. No active cardiopulmonary disease.   EKG 02/24/16: NSR.   Echo 03/04/14:  - Left ventricle: The cavity size was normal. Wall thickness was normal. Systolic function was normal. The estimated ejection fraction was in the range of 55% to 60%. - Impressions: With injection of agitated saline there were few bubbles seen in L sided chambers consistent with very small R to L shunt, most likely at atrial level (PFO) Not seen by color doppler  Called to see pt for c/o SOB at rest on and off since December. Episodes occur every day, 2-3 times per day. Episodes only occur at rest, while lying down watching TV. Pt reports will feel chest tightness and it becomes hard to catch her breath. This can last 10 minutes to 2 hours. Nothing relieves the sx, nothing makes them worse; episodes resolve on their own. They never occur with activity. She reports telling her PCP and neurologist about these episodes but they were not concerned about them. In reviewing records, pt is a frequent visitor to the ER, and both her PCP's and neurologist's notes are in [place]. Only one ER visit since December 2016 indicate a complaint of SOB; the rest of the notes indicate no SOB, no CP, and all indicate a normal respiratory system objective assessment. Today at PAT, lungs clear, RR normal. No peripheral edema, PT pulses normal and equal bilaterally. HRRR, no murmurs auscultated.    Called to see pt again in EKG room.  Apparently while getting EKG, pt became unresponsive.  VS  including pulse ox are all normal. Pt will flutter her eyelids when her name is called. [place] O2 placed, pt placed on heart monitor and she was transported to ER for further assessment. Notified Manuela Schwartz at Dr. Donnella Bi office about episode and transfer to ER.   Willeen Cass, FNP-BC [facility]/Anesthesiology Phone: [phone] 02/24/2016 4:23 PM

## 2016-02-25 ENCOUNTER — Ambulatory Visit (HOSPITAL_COMMUNITY)
Admission: RE | Admit: 2016-02-25 | Discharge: 2016-02-25 | Disposition: A | Discharge status: Home / Self Care | Payer: Medicaid Other | Source: Ambulatory Visit | Attending: Neurosurgery | Admitting: Neurosurgery

## 2016-02-25 ENCOUNTER — Ambulatory Visit (HOSPITAL_COMMUNITY): Payer: Medicaid Other | Admitting: Certified Registered Nurse Anesthetist

## 2016-02-25 ENCOUNTER — Encounter (HOSPITAL_COMMUNITY): Payer: Self-pay | Admitting: General Practice

## 2016-02-25 ENCOUNTER — Encounter (HOSPITAL_COMMUNITY)
Admission: RE | Disposition: A | Discharge status: Home / Self Care | Payer: Self-pay | Source: Ambulatory Visit | Attending: Neurosurgery

## 2016-02-25 DIAGNOSIS — R0602 Shortness of breath: Secondary | ICD-10-CM | POA: Diagnosis not present

## 2016-02-25 DIAGNOSIS — Z91040 Latex allergy status: Secondary | ICD-10-CM | POA: Insufficient documentation

## 2016-02-25 DIAGNOSIS — R262 Difficulty in walking, not elsewhere classified: Secondary | ICD-10-CM | POA: Insufficient documentation

## 2016-02-25 DIAGNOSIS — Z9884 Bariatric surgery status: Secondary | ICD-10-CM | POA: Diagnosis not present

## 2016-02-25 DIAGNOSIS — M797 Fibromyalgia: Secondary | ICD-10-CM | POA: Diagnosis not present

## 2016-02-25 DIAGNOSIS — Z885 Allergy status to narcotic agent status: Secondary | ICD-10-CM | POA: Diagnosis not present

## 2016-02-25 DIAGNOSIS — G629 Polyneuropathy, unspecified: Secondary | ICD-10-CM | POA: Diagnosis not present

## 2016-02-25 DIAGNOSIS — Q211 Atrial septal defect: Secondary | ICD-10-CM | POA: Diagnosis not present

## 2016-02-25 DIAGNOSIS — E440 Moderate protein-calorie malnutrition: Secondary | ICD-10-CM | POA: Diagnosis not present

## 2016-02-25 DIAGNOSIS — N3010 Interstitial cystitis (chronic) without hematuria: Secondary | ICD-10-CM | POA: Insufficient documentation

## 2016-02-25 DIAGNOSIS — G609 Hereditary and idiopathic neuropathy, unspecified: Secondary | ICD-10-CM | POA: Diagnosis not present

## 2016-02-25 DIAGNOSIS — Z6836 Body mass index (BMI) 36.0-36.9, adult: Secondary | ICD-10-CM | POA: Diagnosis not present

## 2016-02-25 DIAGNOSIS — K760 Fatty (change of) liver, not elsewhere classified: Secondary | ICD-10-CM | POA: Insufficient documentation

## 2016-02-25 DIAGNOSIS — Z87891 Personal history of nicotine dependence: Secondary | ICD-10-CM | POA: Diagnosis not present

## 2016-02-25 DIAGNOSIS — Z91048 Other nonmedicinal substance allergy status: Secondary | ICD-10-CM | POA: Diagnosis not present

## 2016-02-25 DIAGNOSIS — Z888 Allergy status to other drugs, medicaments and biological substances status: Secondary | ICD-10-CM | POA: Insufficient documentation

## 2016-02-25 DIAGNOSIS — K589 Irritable bowel syndrome without diarrhea: Secondary | ICD-10-CM | POA: Insufficient documentation

## 2016-02-25 DIAGNOSIS — E669 Obesity, unspecified: Secondary | ICD-10-CM | POA: Diagnosis not present

## 2016-02-25 DIAGNOSIS — R202 Paresthesia of skin: Secondary | ICD-10-CM | POA: Insufficient documentation

## 2016-02-25 DIAGNOSIS — F410 Panic disorder [episodic paroxysmal anxiety] without agoraphobia: Secondary | ICD-10-CM | POA: Diagnosis not present

## 2016-02-25 DIAGNOSIS — G2581 Restless legs syndrome: Secondary | ICD-10-CM | POA: Diagnosis not present

## 2016-02-25 DIAGNOSIS — F418 Other specified anxiety disorders: Secondary | ICD-10-CM | POA: Diagnosis not present

## 2016-02-25 HISTORY — PX: SURAL NERVE BX: SHX2476

## 2016-02-25 SURGERY — SURAL NERVE BIOPSY
Anesthesia: General | Site: Leg Lower | Laterality: Left

## 2016-02-25 MED ORDER — DEXAMETHASONE SODIUM PHOSPHATE 4 MG/ML IJ SOLN
INTRAMUSCULAR | Status: DC | PRN
  Administered 2016-02-25: 4 mg via INTRAVENOUS

## 2016-02-25 MED ORDER — LIDOCAINE HCL (CARDIAC) 20 MG/ML IV SOLN
INTRAVENOUS | Status: DC | PRN
  Administered 2016-02-25: 40 mg via INTRAVENOUS
  Administered 2016-02-25: 60 mg via INTRATRACHEAL

## 2016-02-25 MED ORDER — BUPIVACAINE HCL (PF) 0.25 % IJ SOLN
INTRAMUSCULAR | Status: DC | PRN
  Administered 2016-02-25: 7.5 mL

## 2016-02-25 MED ORDER — SODIUM CHLORIDE 0.9 % IR SOLN
Status: DC | PRN
  Administered 2016-02-25: 10:00:00

## 2016-02-25 MED ORDER — DIPHENHYDRAMINE HCL 50 MG/ML IJ SOLN
INTRAMUSCULAR | Status: DC | PRN
  Administered 2016-02-25: 12.5 mg via INTRAVENOUS

## 2016-02-25 MED ORDER — ONDANSETRON HCL 4 MG/2ML IJ SOLN
INTRAMUSCULAR | Status: AC
  Filled 2016-02-25: qty 2

## 2016-02-25 MED ORDER — HYDROCODONE-ACETAMINOPHEN 5-325 MG PO TABS
2.0000 | ORAL_TABLET | Freq: Four times a day (QID) | ORAL | Status: DC | PRN
  Administered 2016-02-25: 2 via ORAL

## 2016-02-25 MED ORDER — PROMETHAZINE HCL 25 MG/ML IJ SOLN: 12.5000 mg | Freq: Once | INTRAMUSCULAR | Status: DC | PRN

## 2016-02-25 MED ORDER — PROPOFOL 10 MG/ML IV BOLUS
INTRAVENOUS | Status: AC
  Filled 2016-02-25: qty 40

## 2016-02-25 MED ORDER — PROPOFOL 10 MG/ML IV BOLUS
INTRAVENOUS | Status: DC | PRN
  Administered 2016-02-25: 170 mg via INTRAVENOUS

## 2016-02-25 MED ORDER — HYDROCODONE-ACETAMINOPHEN 5-325 MG PO TABS
ORAL_TABLET | ORAL | Status: AC
  Administered 2016-02-25: 2 via ORAL
  Filled 2016-02-25: qty 2

## 2016-02-25 MED ORDER — FENTANYL CITRATE (PF) 100 MCG/2ML IJ SOLN
INTRAMUSCULAR | Status: DC | PRN
  Administered 2016-02-25 (×2): 50 ug via INTRAVENOUS

## 2016-02-25 MED ORDER — LACTATED RINGERS IV SOLN
INTRAVENOUS | Status: DC
  Administered 2016-02-25: 08:00:00 via INTRAVENOUS

## 2016-02-25 MED ORDER — SCOPOLAMINE 1 MG/3DAYS TD PT72
MEDICATED_PATCH | TRANSDERMAL | Status: AC
  Administered 2016-02-25: 1 via TRANSDERMAL
  Filled 2016-02-25: qty 1

## 2016-02-25 MED ORDER — FENTANYL CITRATE (PF) 100 MCG/2ML IJ SOLN
25.0000 ug | INTRAMUSCULAR | Status: DC | PRN
  Administered 2016-02-25: 50 ug via INTRAVENOUS

## 2016-02-25 MED ORDER — ARTIFICIAL TEARS OP OINT
TOPICAL_OINTMENT | OPHTHALMIC | Status: AC
  Filled 2016-02-25: qty 3.5

## 2016-02-25 MED ORDER — MIDAZOLAM HCL 2 MG/2ML IJ SOLN
INTRAMUSCULAR | Status: AC
  Filled 2016-02-25: qty 2

## 2016-02-25 MED ORDER — STERILE WATER FOR INJECTION IJ SOLN
INTRAMUSCULAR | Status: AC
  Filled 2016-02-25: qty 10

## 2016-02-25 MED ORDER — ONDANSETRON HCL 4 MG/2ML IJ SOLN
INTRAMUSCULAR | Status: DC | PRN
  Administered 2016-02-25: 4 mg via INTRAVENOUS

## 2016-02-25 MED ORDER — LIDOCAINE HCL (CARDIAC) 20 MG/ML IV SOLN
INTRAVENOUS | Status: AC
  Filled 2016-02-25: qty 5

## 2016-02-25 MED ORDER — FENTANYL CITRATE (PF) 250 MCG/5ML IJ SOLN
INTRAMUSCULAR | Status: AC
  Filled 2016-02-25: qty 5

## 2016-02-25 MED ORDER — 0.9 % SODIUM CHLORIDE (POUR BTL) OPTIME
TOPICAL | Status: DC | PRN
  Administered 2016-02-25: 1000 mL

## 2016-02-25 MED ORDER — ROCURONIUM BROMIDE 50 MG/5ML IV SOLN
INTRAVENOUS | Status: AC
  Filled 2016-02-25: qty 1

## 2016-02-25 MED ORDER — MIDAZOLAM HCL 5 MG/5ML IJ SOLN
INTRAMUSCULAR | Status: DC | PRN
  Administered 2016-02-25: 2 mg via INTRAVENOUS

## 2016-02-25 MED ORDER — LIDOCAINE-EPINEPHRINE 1 %-1:100000 IJ SOLN
INTRAMUSCULAR | Status: DC | PRN
  Administered 2016-02-25: 7.5 mL

## 2016-02-25 MED ORDER — HYDROCODONE-ACETAMINOPHEN 5-325 MG PO TABS: 1.0000 | ORAL_TABLET | ORAL | Status: DC | PRN

## 2016-02-25 MED ORDER — SUCCINYLCHOLINE CHLORIDE 20 MG/ML IJ SOLN
INTRAMUSCULAR | Status: DC | PRN
  Administered 2016-02-25: 120 mg via INTRAVENOUS

## 2016-02-25 MED ORDER — SUCCINYLCHOLINE CHLORIDE 20 MG/ML IJ SOLN
INTRAMUSCULAR | Status: AC
  Filled 2016-02-25: qty 1

## 2016-02-25 MED ORDER — FENTANYL CITRATE (PF) 100 MCG/2ML IJ SOLN
INTRAMUSCULAR | Status: AC
  Filled 2016-02-25: qty 2

## 2016-02-25 MED ORDER — EPHEDRINE SULFATE 50 MG/ML IJ SOLN
INTRAMUSCULAR | Status: AC
  Filled 2016-02-25: qty 1

## 2016-02-25 MED ORDER — ACETAMINOPHEN 10 MG/ML IV SOLN
INTRAVENOUS | Status: AC
  Administered 2016-02-25: 1000 mg via INTRAVENOUS
  Filled 2016-02-25: qty 100

## 2016-02-25 SURGICAL SUPPLY — 52 items
ADH SKN CLS APL DERMABOND .7 (GAUZE/BANDAGES/DRESSINGS) ×1
APL SKNCLS STERI-STRIP NONHPOA (GAUZE/BANDAGES/DRESSINGS)
BAG DECANTER FOR FLEXI CONT (MISCELLANEOUS) ×2 IMPLANT
BANDAGE GAUZE 4  KLING STR (GAUZE/BANDAGES/DRESSINGS) ×1 IMPLANT
BENZOIN TINCTURE PRP APPL 2/3 (GAUZE/BANDAGES/DRESSINGS) ×1 IMPLANT
BLADE CLIPPER SURG (BLADE) IMPLANT
BLADE SURG 10 STRL SS (BLADE) ×2 IMPLANT
BLADE SURG 11 STRL SS (BLADE) ×1 IMPLANT
BLADE SURG 15 STRL LF DISP TIS (BLADE) ×1 IMPLANT
BLADE SURG 15 STRL SS (BLADE) ×2
CANISTER SUCT 3000ML PPV (MISCELLANEOUS) IMPLANT
CONT SPEC 4OZ CLIKSEAL STRL BL (MISCELLANEOUS) ×1 IMPLANT
CORDS BIPOLAR (ELECTRODE) ×2 IMPLANT
DEPRESSOR TONGUE BLADE STERILE (MISCELLANEOUS) IMPLANT
DERMABOND ADVANCED (GAUZE/BANDAGES/DRESSINGS) ×1
DERMABOND ADVANCED .7 DNX12 (GAUZE/BANDAGES/DRESSINGS) IMPLANT
DRAPE EXTREMITY T 121X128X90 (DRAPE) ×2 IMPLANT
DRAPE INCISE IOBAN 66X45 STRL (DRAPES) ×1 IMPLANT
GAUZE SPONGE 4X4 12PLY STRL (GAUZE/BANDAGES/DRESSINGS) ×2 IMPLANT
GAUZE SPONGE 4X4 16PLY XRAY LF (GAUZE/BANDAGES/DRESSINGS) ×2 IMPLANT
GLOVE BIOGEL PI IND STRL 8 (GLOVE) ×1 IMPLANT
GLOVE BIOGEL PI INDICATOR 8 (GLOVE) ×1
GLOVE ECLIPSE 7.5 STRL STRAW (GLOVE) ×2 IMPLANT
GLOVE EXAM NITRILE LRG STRL (GLOVE) IMPLANT
GLOVE EXAM NITRILE MD LF STRL (GLOVE) IMPLANT
GLOVE EXAM NITRILE XL STR (GLOVE) IMPLANT
GLOVE EXAM NITRILE XS STR PU (GLOVE) IMPLANT
GOWN STRL REUS W/ TWL LRG LVL3 (GOWN DISPOSABLE) IMPLANT
GOWN STRL REUS W/ TWL XL LVL3 (GOWN DISPOSABLE) IMPLANT
GOWN STRL REUS W/TWL 2XL LVL3 (GOWN DISPOSABLE) IMPLANT
GOWN STRL REUS W/TWL LRG LVL3 (GOWN DISPOSABLE) ×2
GOWN STRL REUS W/TWL XL LVL3 (GOWN DISPOSABLE) ×2
KIT BASIN OR (CUSTOM PROCEDURE TRAY) ×2 IMPLANT
KIT ROOM TURNOVER OR (KITS) ×2 IMPLANT
LOCATOR NERVE 3 VOLT (DISPOSABLE) IMPLANT
MARKER SKIN DUAL TIP RULER LAB (MISCELLANEOUS) ×2 IMPLANT
NDL HYPO 25X1 1.5 SAFETY (NEEDLE) ×1 IMPLANT
NEEDLE HYPO 25X1 1.5 SAFETY (NEEDLE) ×2 IMPLANT
NS IRRIG 1000ML POUR BTL (IV SOLUTION) ×2 IMPLANT
PACK SURGICAL SETUP 50X90 (CUSTOM PROCEDURE TRAY) ×2 IMPLANT
SPECIMEN JAR SMALL (MISCELLANEOUS) ×1 IMPLANT
STOCKINETTE 4X48 STRL (DRAPES) ×2 IMPLANT
STRIP CLOSURE SKIN 1/4X4 (GAUZE/BANDAGES/DRESSINGS) ×2 IMPLANT
SUT VIC AB 2-0 CP2 18 (SUTURE) ×2 IMPLANT
SUT VIC AB 3-0 SH 8-18 (SUTURE) ×2 IMPLANT
SYR BULB 3OZ (MISCELLANEOUS) ×2 IMPLANT
SYR CONTROL 10ML LL (SYRINGE) ×2 IMPLANT
SYR TB 1ML 25GX5/8 (SYRINGE) ×2 IMPLANT
TOWEL OR 17X24 6PK STRL BLUE (TOWEL DISPOSABLE) ×1 IMPLANT
TOWEL OR 17X26 10 PK STRL BLUE (TOWEL DISPOSABLE) ×2 IMPLANT
TUBE CONNECTING 12X1/4 (SUCTIONS) IMPLANT
WATER STERILE IRR 1000ML POUR (IV SOLUTION) ×2 IMPLANT

## 2016-02-25 NOTE — Anesthesia Procedure Notes (Signed)
Procedure Name: Intubation Date/Time: 02/25/2016 9:29 AM Performed by: [place] Pink Pre-anesthesia Checklist: Patient identified, Emergency Drugs available, Suction available, Patient being monitored and Timeout performed Patient Re-evaluated:Patient Re-evaluated prior to inductionOxygen Delivery Method: Circle system utilized Preoxygenation: Pre-oxygenation with 100% oxygen Intubation Type: IV induction Ventilation: Mask ventilation without difficulty Laryngoscope Size: Mac and 4 Grade View: Grade I Tube type: Oral Tube size: 7.5 mm Number of attempts: 1 Airway Equipment and Method: Stylet and LTA kit utilized Placement Confirmation: ETT inserted through vocal cords under direct vision,  positive ETCO2 and breath sounds checked- equal and bilateral Secured at: 20 cm Tube secured with: Tape Dental Injury: Teeth and Oropharynx as per pre-operative assessment

## 2016-02-25 NOTE — Discharge Instructions (Signed)
Elevate leg  Follow up with DR Posey Pronto and Dr Rita Ohara (already has appts)  Remove dressing in 2 days

## 2016-02-25 NOTE — Anesthesia Preprocedure Evaluation (Addendum)
Anesthesia Evaluation  Patient identified by MRN, date of birth, ID band Patient awake    Reviewed: Allergy & Precautions, NPO status , Patient's Chart, lab work & pertinent test results  Airway Mallampati: II  TM Distance: >3 FB Neck ROM: Full    Dental  (+) Teeth Intact, Dental Advisory Given   Pulmonary former smoker,    breath sounds clear to auscultation       Cardiovascular  Rhythm:Regular     Neuro/Psych    GI/Hepatic   Endo/Other    Renal/GU      Musculoskeletal   Abdominal   Peds  Hematology   Anesthesia Other Findings   Reproductive/Obstetrics                            Anesthesia Physical Anesthesia Plan  ASA: III  Anesthesia Plan: General   Post-op Pain Management:    Induction: Intravenous  Airway Management Planned: Oral ETT  Additional Equipment:   Intra-op Plan:   Post-operative Plan: Extubation in OR  Informed Consent: I have reviewed the patients History and Physical, chart, labs and discussed the procedure including the risks, benefits and alternatives for the proposed anesthesia with the patient or authorized representative who has indicated his/her understanding and acceptance.   Dental advisory given  Plan Discussed with: CRNA and Anesthesiologist  Anesthesia Plan Comments:         Anesthesia Quick Evaluation

## 2016-02-25 NOTE — Transfer of Care (Signed)
Immediate Anesthesia Transfer of Care Note  [name]  Procedure(s) Performed: Procedure(s) with comments: LEFT SURAL NERVE BIOPSY (Left) - Left sural nerve biopsy  Patient Location: PACU  Anesthesia Type:General  Level of Consciousness: sedated  Airway & Oxygen Therapy: Patient Spontanous Breathing and Patient connected to nasal cannula oxygen  Post-op Assessment: Report given to RN and Post -op Vital signs reviewed and stable  Post vital signs: Reviewed and stable  Last Vitals:  Filed Vitals:   02/25/16 0752 02/25/16 1045  BP: 113/73 123/84  Pulse: 69 79  Temp: 37.2 C 36.3 C  Resp: 18 14    Complications: No apparent anesthesia complications

## 2016-02-25 NOTE — H&P (Signed)
Subjective: Patient is a 30 y.o. right handed white female who is brought to surgery for an outpatient left sural nerve biopsy which is been requested by her neurologist Dr. Narda Amber. Dr. Posey Pronto is evaluating the patient for peripheral neuropathy. According to the patient and her mother, Dr. Posey Pronto suspects an autoimmune neuropathy and has requested a biopsy to assist in her evaluation and care of the patient.  The patient's been having difficulty since late last year. She has weakness and numbness in her lower extremities, worse on the left than the right. She's been using a walker.   Patient Active Problem List   Diagnosis Date Noted  . Complaints of leg weakness   . Paresthesia   . Left-sided weakness   . Uncontrolled pain 01/24/2016  . Malnutrition of moderate degree 01/24/2016  . Neuropathy ([place]) 01/23/2016  . Inability to walk 01/23/2016  . Paresthesias   . Bilateral leg numbness 11/26/2015  . Major depressive disorder, recurrent episode, 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[place]) 05/10/2015  . Post partum depression 05/09/2015  . Anastomotic ulcer   . Dysphagia   . Hematemesis 04/07/2015  . Intractable vomiting with nausea 04/07/2015  . Elevated liver enzymes   . Epigastric pain   . Abdominal pain   . Microcytic anemia   . Pseudoseizure ([place]) 02/22/2015  . Seizure-like activity ([place]) 02/22/2015  . Fibromyalgia 02/18/2015  . Vulvar fissure 02/18/2015  . Polypharmacy 02/01/2015  . Adverse drug effect 02/01/2015  . Clinical depression 02/01/2015  . Current smoker 01/29/2015  . Current tobacco use 01/29/2015  . 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 Leg weakness, bilateral 01/02/2015  . Gait disorder 01/02/2015  . Other symptoms and signs involving the musculoskeletal system 01/02/2015  . Abnormal gait 01/02/2015  . Cervicalgia 12/18/2014  . Sciatica of left side 11/14/2014  . Neuralgia neuritis, sciatic nerve 11/14/2014  . 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  Family planning 09/09/2014  . Abdominal pain, right lower quadrant 08/22/2014  . Headache, migraine 08/19/2014  . Seizure ([place]) 08/18/2014  . Encephalopathy 08/13/2014  . Headache 08/13/2014  . Altered mental status 08/13/2014  . Right leg numbness 07/31/2014  . Disturbance of skin sensation 07/31/2014  . Hypertension in pregnancy, transient 07/08/2014  . Previous gastric bypass affecting pregnancy, antepartum 05/22/2014  . Bariatric surgery status complicating pregnancy 23/53/6144  . 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 ASD (atrial septal defect), ostium secundum 05/17/2014  . Depression complicating pregnancy in second trimester, antepartum 05/09/2014  . Antepartum mental disorder in pregnancy 05/09/2014  . Rapid palpitations 02/18/2014  . H/O maternal third degree perineal laceration, currently pregnant 02/18/2014  . High-risk pregnancy 02/18/2014  . Awareness of heartbeats 02/18/2014  . Supervision of pregnancy with other poor reproductive or obstetric history, unspecified trimester 02/18/2014  . Maternal iron deficiency anemia complicating pregnancy 31/54/0086  . Restless legs 01/16/2014  . Restless leg 01/16/2014  . Absolute anemia 01/16/2014  . Chronic interstitial cystitis 10/23/2013  . Menorrhagia 07/18/2013  . Excessive and frequent menstruation 07/18/2013  . h/o Opiate addiction 03/11/2012    Class: Acute  . Opioid dependence ([place]) 03/11/2012  . Passive suicidal ideations 03/10/2012    Class: Acute  . History of migraine headaches 03/10/2012    Class: Acute  . Depression with anxiety 03/10/2012    Class: Chronic  . 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  Panic disorder without agoraphobia 03/10/2012  . H/O disease 03/10/2012  . Dysthymia 03/10/2012  . Pelvic congestion syndrome 10/13/2011  .  Coitalgia 10/13/2011  . Chronic migraine without aura 10/13/2011  . Unspecified dyspareunia 10/13/2011  . IBS (irritable bowel syndrome) 08/25/2011  . Diarrhea 05/27/2011  . OBESITY, UNSPECIFIED 09/17/2010  . Transaminitis 09/17/2010  . Adiposity 09/17/2010  . Iron deficiency anemia 07/23/2010   Past Medical History  Diagnosis Date  . Migraines   . Interstitial cystitis   . IUD FEB 2010  . Gastritis JULY 2011  . Depression   . 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 Fatty liver   . Obesity (BMI 30-39.9) 2011 228 LBS BMI 36.8  . Iron deficiency anemia 07/23/2010  . Irritable bowel syndrome 2012 DIARRHEA    JUN 2012 TTG IgA 14.9  . Elevated liver enzymes JUL 2011ALK PHOS 111-127 AST  143-267 ALT  213-321T BILI 0.6  ALB  3.7-4.06 Jun 2011 ALK PHOS 118 AST 24 ALT 42 T BILI 0.4 ALB 3.9  . Chronic daily headache   . Ovarian cyst   . ADD (attention deficit disorder)   . RLQ abdominal pain 07/18/2013  . Menorrhagia 07/18/2013  . Anxiety   . Potassium (K) deficiency   . Dysmenorrhea 09/18/2013  . Stress 09/18/2013  . Patient desires pregnancy 09/18/2013  . Pregnant 12/25/2013  . Blood transfusion without reported diagnosis   . Heart murmur   . Gastric bypass status for obesity   . 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 Dysrhythmia   . Sciatica of left side 11/14/2014  . Fibromyalgia   . 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 PONV (postoperative nausea and vomiting) seizure post-operatively  . Psychiatric pseudoseizure   . Depression   . Lupus ([place])   . Family history of adverse reaction to anesthesia     'dad had to be kept on pump for breathing for morphine'  . History of Holter monitoring   . Anginal pain ([place])   . Hx of opioid abuse     for about 4 years, about 4 years ago  . Encounter for drug rehabilitation     at behavioral health for opioid addiction about 4 years ago    Past Surgical History  Procedure Laterality Date  . Gab  2007     in [place]-POUCH 5 CM  . Cholecystectomy  2005    biliary dyskinesia  . Cath self every nite      for sodium bicarb injection (discontinued 2013)  . Colonoscopy  JUN 2012 ABD PN/DIARRHEA WITH PROPOFOL    NL COLON  . Upper gastrointestinal endoscopy  JULY 2011 NAUSEA-D125,V6, PH 25    Bx; GASTRITIS, POUCH-5 CM LONG  . Dilation and curettage of uterus    . Tonsillectomy    . Gastric bypass  06/2006  . [place] dilation  06/20/2012    Dr. Barnie Alderman gastritis/Ulcer in the mid jejunum. Empiric dilation.   . Tonsillectomy and adenoidectomy    . Esophagogastroduodenoscopy    . Wisdom tooth extraction    . Repair vaginal cuff N/A 07/30/2014    Procedure: REPAIR VAGINAL CUFF;  Surgeon: Mora Bellman, MD;  Location: [facility];  Service: Gynecology;  Laterality: N/A;  . Hysteroscopy w/d&c N/A 09/12/2014    Procedure: DILATATION AND CURETTAGE /HYSTEROSCOPY;  Surgeon: Jonnie Kind, MD;  Location: [facility];  Service: Gynecology;  Laterality: N/A;  . Esophagogastroduodenoscopy (egd) with propofol N/A  04/10/2015    Procedure: ESOPHAGOGASTRODUODENOSCOPY (EGD) WITH PROPOFOL;  Surgeon: Daneil Dolin, MD;  Location: [facility];  Service: Endoscopy;  Laterality: N/A;  . Esophageal dilation N/A 04/10/2015    Procedure: ESOPHAGEAL DILATION WITH 54FR MALONEY DILATOR;  Surgeon: Daneil Dolin, MD;  Location: [facility];  Service: Endoscopy;  Laterality: N/A;  . Biopsy  04/10/2015    Procedure: BIOPSY;  Surgeon: Daneil Dolin, MD;  Location: [facility];  Service: Endoscopy;;    Prescriptions prior to admission  Medication Sig Dispense Refill Last Dose  . carbamazepine (TEGRETOL) 200 MG tablet Take two tablets at bedtime (Patient taking differently: Take 400 mg by mouth at bedtime. Take two tablets at bedtime) 60 tablet 2 02/24/2016 at Unknown time  . clonazePAM (KLONOPIN) 0.5 MG tablet Take 1 tablet (0.5 mg total) by mouth 3 (three) times daily as needed for anxiety. 90 tablet 0 02/25/2016 at 0730  . DULoxetine (CYMBALTA) 60  MG capsule Take 1 capsule (60 mg total) by mouth daily. 30 capsule 2 02/24/2016 at Unknown time  . folic acid (FOLVITE) 778 MCG tablet Take 800 mcg by mouth at bedtime.   02/24/2016 at Unknown time  . HYDROcodone-acetaminophen (NORCO) 7.5-325 MG tablet Take 1 tablet by mouth every 6 (six) hours as needed. (Patient taking differently: Take 1 tablet by mouth every 6 (six) hours as needed for moderate pain. ) 56 tablet 0 Past Week at Unknown time  . tiZANidine (ZANAFLEX) 4 MG tablet Take 1 tablet (4 mg total) by mouth 3 (three) times daily as needed for muscle spasms. 30 tablet 1 02/24/2016 at Unknown time  . topiramate (TOPAMAX) 50 MG tablet TAKE 1 TABLET BY MOUTH EACH MORNING AND 2 TABLETS IN THE EVENING AS NEEDED FOR MIGRAINES. 90 tablet 0 02/24/2016 at Unknown time  . amphetamine-dextroamphetamine (ADDERALL XR) 30 MG 24 hr capsule Take 1 capsule (30 mg total) by mouth daily. 30 capsule 0 More than a month at Unknown time  . APAP-Pamabrom-Pyrilamine (MENSTRUAL PAIN RELIEF) 500-25-15 MG TABS Take 1-2 tablets by mouth daily as needed (for pain).   More than a month at Unknown time   Allergies  Allergen Reactions  . Gabapentin Other (See Comments)    Pt states that she was unresponsive after taking this medication, but her vitals remained stable.    . Metoclopramide Hcl Anxiety and Other (See Comments)    Pt states that she felt like she was trapped in a box, and could not get out.  Pt also states that she had temporary loss of movement, weakness, and tingling.    . Tramadol Other (See Comments)    Seizures  . Morphine And Related Other (See Comments)    Chest pain   . Propofol Nausea And Vomiting  . Trazodone And Nefazodone Other (See Comments)    Makes pt "like a zombie"  . Zofran [Ondansetron] Other (See Comments)    Migraines   . Latex Rash  . Tape Itching and Rash    Please use paper tape    Social History  Substance Use Topics  . Smoking status: Former Smoker -- 0.25 packs/day for 6  years    Types: Cigarettes    Quit date: 11/29/2015  . Smokeless tobacco: Never Used  . Alcohol Use: 0.0 oz/week    0 Standard drinks or equivalent per week     Comment: once every weeks - maybe a beer with a dinner, rarely    Family History  Problem Relation Age of Onset  .  Hemochromatosis Maternal Grandmother   . Migraines Maternal Grandmother   . Cancer Maternal Grandmother   . Breast cancer Maternal Grandmother   . Hypertension Father   . Diabetes Father   . Coronary artery disease Father   . Migraines Paternal Grandmother   . Breast cancer Paternal Grandmother   . Cancer Mother     breast  . Hemochromatosis Mother   . Breast cancer Mother   . Depression Mother   . Anxiety disorder Mother   . Coronary artery disease Paternal Grandfather   . Anxiety disorder Brother   . Bipolar disorder Brother   . Healthy Son   . Healthy Daughter      Review of Systems A comprehensive review of systems was negative.  Objective: Vital signs in last 24 hours: Temp:  [97.8 F (36.6 C)-98.9 F (37.2 C)] 98.9 F (37.2 C) (03/22 0752) Pulse Rate:  [67-88] 69 (03/22 0752) Resp:  [15-21] 18 (03/22 0752) BP: (100-113)/(59-88) 113/73 mmHg (03/22 0752) SpO2:  [90 %-100 %] 97 % (03/22 0752) Weight:  [92.704 kg (204 lb 6 oz)-92.715 kg (204 lb 6.4 oz)] 92.704 kg (204 lb 6 oz) (03/22 0752)  EXAM: Patient is a well-developed well-nourished white female in no acute distress. Lungs are clear to auscultation , the patient has symmetrical respiratory excursion. Heart has a regular rate and rhythm normal S1 and S2 no murmur.   Abdomen is soft nontender nondistended bowel sounds are present. Extremity examination shows no clubbing cyanosis or edema. Neurologic examination shows the strength is 5/5 in the iliopsoas, quadriceps, dorsiflexor, EHL bilaterally, as well as in the right plantar flexor, but the left plantar flexors 2/5. Sensation is diminished in the distal lower extremities, more so on the  left than the right, with little in the way pinprick sensation in the distal lower extremities.  Data Review:CBC    Component Value Date/Time   WBC 10.5 02/24/2016 1535   RBC 3.63* 02/24/2016 1535   RBC 3.90 01/24/2016 1000   HGB 10.4* 02/24/2016 1535   HGB 8.1* 02/18/2015 1152   HCT 32.2* 02/24/2016 1535   HCT 29.5* 01/24/2016 0357   PLT 221 02/24/2016 1535   MCV 88.7 02/24/2016 1535   MCH 28.7 02/24/2016 1535   MCHC 32.3 02/24/2016 1535   RDW 18.5* 02/24/2016 1535   LYMPHSABS 2.3 01/23/2016 1816   MONOABS 0.6 01/23/2016 1816   EOSABS 0.1 01/23/2016 1816   BASOSABS 0.0 01/23/2016 1816                          BMET    Component Value Date/Time   NA 143 02/24/2016 1535   K 4.2 02/24/2016 1535   CL 112* 02/24/2016 1535   CO2 19* 02/24/2016 1535   GLUCOSE 101* 02/24/2016 1535   BUN 12 02/24/2016 1535   CREATININE 1.00 02/24/2016 1535   CREATININE 0.64 07/28/2014 2233   CREATININE 0.83 11/16/2013 0933   CALCIUM 9.0 02/24/2016 1535   GFRNONAA >60 02/24/2016 1535   GFRAA >60 02/24/2016 1535     Assessment/Plan: Patient brought to surgery for a left sural nerve biopsy at the request of her neurologist Dr. Narda Amber.  I discussed the nature the procedure and its risks with the patient and her mother including risks of infection, bleeding, the loss of sensation in the posterior lateral left foot, if there is any remaining sensation in that area, and anesthetic risks of myocardial infarction, stroke, pneumonia, and death.  Hosie Spangle,  MD 02/25/2016 8:42 AM

## 2016-02-25 NOTE — Anesthesia Postprocedure Evaluation (Signed)
Anesthesia Post Note  [name]  Procedure(s) Performed: Procedure(s) (LRB): LEFT SURAL NERVE BIOPSY (Left)  Patient location during evaluation: PACU Anesthesia Type: General Level of consciousness: awake, awake and alert and oriented Pain management: pain level controlled Vital Signs Assessment: post-procedure vital signs reviewed and stable Respiratory status: spontaneous breathing and nonlabored ventilation Anesthetic complications: no    Last Vitals:  Filed Vitals:   02/25/16 1145 02/25/16 1200  BP: 111/73   Pulse: 72   Temp: 36.3 C 36.3 C  Resp: 16     Last Pain:  Filed Vitals:   02/25/16 1211  PainSc: 9                  Jariah Tarkowski COKER

## 2016-02-25 NOTE — Op Note (Signed)
02/25/2016  10:41 AM  PATIENT:  Penny [name]  30 y.o. female  PRE-OPERATIVE DIAGNOSIS:  peripheral polyneuropathy  POST-OPERATIVE DIAGNOSIS:  peripheral polyneuropathy  PROCEDURE:  Procedure(s):  LEFT SURAL NERVE BIOPSY  SURGEON:  Surgeon(s): Jovita Gamma, MD  ANESTHESIA:   general  EBL:   nil  COUNT: Correct per nursing staff  SPECIMEN:  Source of Specimen:  Left sural nerve  DICTATION: Patient was brought to the operating room, placed under general endotracheal anesthesia. He was turned to a left side up lateral decubitus position, and allowed to roll slightly towards a prone position, with body prominences padded, the patient secured. The distal left lower extremity was prepped with Betadine soap and solution draped in a sterile fashion. The posterior lateral left ankle was exposed and the line of incision was infiltrated with local site with epinephrine. A vertical incision was made midway between the posterior aspect of the left lateral malleolus and the lateral aspect of the left Achilles tendon. Incision made, carried out through the subcutaneous tissue. Bipolar cautery used to maintain hemostasis. The subcutaneous tissues tissue was exposed, and we identified the left sural nerve. Dissection was then carried out rostrally and caudally along the nerve. The nerve resection sharply proximally, and then distally. A 4 cm segment of nerve was sent to pathology in a saline moistened gauze sponge for light and electrolyte microscopy. The wound was then irrigated with bacitracin solution, hemostasis established confirmed, and then we proceeded with closure. The second cutaneous and subcuticular closed with interrupted inverted 3-0 undyed Vicryl sutures. The skin is approximate Dermabond. The wound was dressed with sterile gauze and wrapped with a 4 inch Kling. Following surgery the patient is to be turned back to supine position, reversed and the anesthetic, extubated, transferred to the  recovery room for further care.  PLAN OF CARE: Discharge to home after PACU  PATIENT DISPOSITION:  PACU - hemodynamically stable.   Delay start of Pharmacological VTE agent (>24hrs) due to surgical blood loss or risk of bleeding:  yes

## 2016-02-26 ENCOUNTER — Telehealth: Payer: Self-pay | Admitting: Neurology

## 2016-02-26 ENCOUNTER — Telehealth: Payer: Self-pay | Admitting: Family Medicine

## 2016-02-26 ENCOUNTER — Other Ambulatory Visit: Payer: Self-pay | Admitting: *Deleted

## 2016-02-26 MED ORDER — PHENAZOPYRIDINE HCL 100 MG PO TABS: 100.0000 mg | ORAL_TABLET | Freq: Three times a day (TID) | ORAL | Status: DC | PRN

## 2016-02-26 MED ORDER — FLUCONAZOLE 150 MG PO TABS: 150.0000 mg | ORAL_TABLET | Freq: Once | ORAL | Status: DC

## 2016-02-26 NOTE — Telephone Encounter (Signed)
Pt is requesting diflucan to be called in. Pt is also wanting pyridium called in.    [facility]

## 2016-02-26 NOTE — Telephone Encounter (Signed)
Requesting diflucan for  White vaginal discharge and itching.  Pt also wants pyridium. She states she gets this to use prn for dysuria and is out of refills.

## 2016-02-26 NOTE — Telephone Encounter (Signed)
meds sent to pharm. Pt notified.  

## 2016-02-26 NOTE — Telephone Encounter (Signed)
Pyridium 100 mg 3 times a day when necessary dysuria #21, 3 refills, Diflucan 150 mg 1 by mouth for yeast infection, #1, 6 refills

## 2016-02-26 NOTE — Telephone Encounter (Signed)
This patient called saying that she had a procedure done yesterday and is experiencing facial swelling and left eye drooping, which is significant. Her name is Penny [name] Aug 24, 1986. She would like to speak with you please. Her Doctor seems to think its not related to the procedure done yesterday. Thank you [phone] or [phone]

## 2016-02-26 NOTE — Telephone Encounter (Signed)
Patient called.

## 2016-02-27 ENCOUNTER — Emergency Department (HOSPITAL_COMMUNITY)
Admission: EM | Admit: 2016-02-27 | Discharge: 2016-02-28 | Disposition: A | Discharge status: Home / Self Care | Payer: Medicaid Other | Attending: Physician Assistant | Admitting: Physician Assistant

## 2016-02-27 ENCOUNTER — Encounter (HOSPITAL_COMMUNITY): Payer: Self-pay | Admitting: Neurosurgery

## 2016-02-27 DIAGNOSIS — F909 Attention-deficit hyperactivity disorder, unspecified type: Secondary | ICD-10-CM | POA: Insufficient documentation

## 2016-02-27 DIAGNOSIS — Z91040 Latex allergy status: Secondary | ICD-10-CM | POA: Insufficient documentation

## 2016-02-27 DIAGNOSIS — M329 Systemic lupus erythematosus, unspecified: Secondary | ICD-10-CM | POA: Insufficient documentation

## 2016-02-27 DIAGNOSIS — Z862 Personal history of diseases of the blood and blood-forming organs and certain disorders involving the immune mechanism: Secondary | ICD-10-CM | POA: Insufficient documentation

## 2016-02-27 DIAGNOSIS — F329 Major depressive disorder, single episode, unspecified: Secondary | ICD-10-CM | POA: Diagnosis not present

## 2016-02-27 DIAGNOSIS — Z8679 Personal history of other diseases of the circulatory system: Secondary | ICD-10-CM | POA: Insufficient documentation

## 2016-02-27 DIAGNOSIS — Z79899 Other long term (current) drug therapy: Secondary | ICD-10-CM | POA: Insufficient documentation

## 2016-02-27 DIAGNOSIS — Z8719 Personal history of other diseases of the digestive system: Secondary | ICD-10-CM | POA: Insufficient documentation

## 2016-02-27 DIAGNOSIS — Z87891 Personal history of nicotine dependence: Secondary | ICD-10-CM | POA: Diagnosis not present

## 2016-02-27 DIAGNOSIS — R21 Rash and other nonspecific skin eruption: Secondary | ICD-10-CM | POA: Insufficient documentation

## 2016-02-27 DIAGNOSIS — Z8742 Personal history of other diseases of the female genital tract: Secondary | ICD-10-CM | POA: Insufficient documentation

## 2016-02-27 DIAGNOSIS — G43909 Migraine, unspecified, not intractable, without status migrainosus: Secondary | ICD-10-CM | POA: Diagnosis not present

## 2016-02-27 DIAGNOSIS — G8929 Other chronic pain: Secondary | ICD-10-CM | POA: Diagnosis not present

## 2016-02-27 DIAGNOSIS — E669 Obesity, unspecified: Secondary | ICD-10-CM | POA: Diagnosis not present

## 2016-02-27 DIAGNOSIS — R011 Cardiac murmur, unspecified: Secondary | ICD-10-CM | POA: Insufficient documentation

## 2016-02-27 DIAGNOSIS — G8918 Other acute postprocedural pain: Secondary | ICD-10-CM | POA: Diagnosis not present

## 2016-02-27 DIAGNOSIS — F419 Anxiety disorder, unspecified: Secondary | ICD-10-CM | POA: Diagnosis not present

## 2016-02-27 DIAGNOSIS — Z6839 Body mass index (BMI) 39.0-39.9, adult: Secondary | ICD-10-CM | POA: Insufficient documentation

## 2016-02-27 DIAGNOSIS — Z9889 Other specified postprocedural states: Secondary | ICD-10-CM | POA: Diagnosis not present

## 2016-02-27 DIAGNOSIS — Z87448 Personal history of other diseases of urinary system: Secondary | ICD-10-CM | POA: Insufficient documentation

## 2016-02-27 NOTE — Telephone Encounter (Signed)
Called patient and she is doing better.  She took benadryl and the swelling has gone down.  Instructed her to follow up with PCP if necessary.

## 2016-02-27 NOTE — ED Notes (Signed)
Pt had surgery Wednesday on L ankle, "nerve biopsy" Pt states "they took out three inches of nerve". Pt states ever since then shes had pain and swelling to L ankle. Incision is approximated and closed with glue. Pt states she cant bare weight on foot. Pt states her left eye was really droopy yesterday. No droop noted. Pt in NAD.

## 2016-02-28 MED ORDER — CEPHALEXIN 500 MG PO CAPS: 500.0000 mg | ORAL_CAPSULE | Freq: Four times a day (QID) | ORAL | Status: DC

## 2016-02-28 NOTE — ED Provider Notes (Signed)
CSN: [id number]     Arrival date & time 02/27/16  2129 History   First MD Initiated Contact with Patient 02/27/16 2343     Chief Complaint  Patient presents with  . Post-op Problem     (Consider location/radiation/quality/duration/timing/severity/associated sxs/prior Treatment) HPI Comments: Patient with recent nerve biopsy performed by Dr. Sherwood Gambler on 02/25/16 presents with increased pain and swelling of affected ankle today. She reports she has been up and walking per post-operative care instructions. No fever. Pain extends proximally and distally to surgical site on lateral left ankle. She has been taking hydrocodone for pain at home with little relief.  She also complains of burning, red rash to dorsal hands bilaterally that started since the surgery. No SOB, difficulty swallowing.  The history is provided by the patient. No language interpreter was used.    Past Medical History  Diagnosis Date  . Migraines   . Interstitial cystitis   . IUD FEB 2010  . Gastritis JULY 2011  . Depression   . 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 Fatty liver   . Obesity (BMI 30-39.9) 2011 228 LBS BMI 36.8  . Iron deficiency anemia 07/23/2010  . Irritable bowel syndrome 2012 DIARRHEA    JUN 2012 TTG IgA 14.9  . Elevated liver enzymes JUL 2011ALK PHOS 111-127 AST  143-267 ALT  213-321T BILI 0.6  ALB  3.7-4.06 Jun 2011 ALK PHOS 118 AST 24 ALT 42 T BILI 0.4 ALB 3.9  . Chronic daily headache   . Ovarian cyst   . ADD (attention deficit disorder)   . RLQ abdominal pain 07/18/2013  . Menorrhagia 07/18/2013  . Anxiety   . Potassium (K) deficiency   . Dysmenorrhea 09/18/2013  . Stress 09/18/2013  . Patient desires pregnancy 09/18/2013  . Pregnant 12/25/2013  . Blood transfusion without reported diagnosis   . Heart murmur   . Gastric bypass status for obesity   . 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 Dysrhythmia   . Sciatica of left side 11/14/2014  . Fibromyalgia   . Hereditary and  idiopathic peripheral neuropathy 01/15/2015  . PONV (postoperative nausea and vomiting) seizure post-operatively  . Psychiatric pseudoseizure   . Depression   . Lupus ([place])   . Family history of adverse reaction to anesthesia     'dad had to be kept on pump for breathing for morphine'  . History of Holter monitoring   . Anginal pain ([place])   . Hx of opioid abuse     for about 4 years, about 4 years ago  . Encounter for drug rehabilitation     at behavioral health for opioid addiction about 4 years ago   Past Surgical History  Procedure Laterality Date  . Gab  2007    in [place]-POUCH 5 CM  . Cholecystectomy  2005    biliary dyskinesia  . Cath self every nite      for sodium bicarb injection (discontinued 2013)  . Colonoscopy  JUN 2012 ABD PN/DIARRHEA WITH PROPOFOL    NL COLON  . Upper gastrointestinal endoscopy  JULY 2011 NAUSEA-D125,V6, PH 25    Bx; GASTRITIS, POUCH-5 CM LONG  . Dilation and curettage of uterus    . Tonsillectomy    . Gastric bypass  06/2006  . [place] dilation  06/20/2012    Dr. Barnie Alderman gastritis/Ulcer in the mid jejunum. Empiric dilation.   . Tonsillectomy and adenoidectomy    . Esophagogastroduodenoscopy    .  Wisdom tooth extraction    . Repair vaginal cuff N/A 07/30/2014    Procedure: REPAIR VAGINAL CUFF;  Surgeon: Mora Bellman, MD;  Location: [facility];  Service: Gynecology;  Laterality: N/A;  . Hysteroscopy w/d&c N/A 09/12/2014    Procedure: DILATATION AND CURETTAGE /HYSTEROSCOPY;  Surgeon: Jonnie Kind, MD;  Location: [facility];  Service: Gynecology;  Laterality: N/A;  . Esophagogastroduodenoscopy (egd) with propofol N/A 04/10/2015    Procedure: ESOPHAGOGASTRODUODENOSCOPY (EGD) WITH PROPOFOL;  Surgeon: Daneil Dolin, MD;  Location: [facility];  Service: Endoscopy;  Laterality: N/A;  . Esophageal dilation N/A 04/10/2015    Procedure: ESOPHAGEAL DILATION WITH 54FR MALONEY DILATOR;  Surgeon: Daneil Dolin, MD;  Location: [facility];  Service: Endoscopy;   Laterality: N/A;  . Biopsy  04/10/2015    Procedure: BIOPSY;  Surgeon: Daneil Dolin, MD;  Location: [facility];  Service: Endoscopy;;  . Sural nerve bx Left 02/25/2016    Procedure: LEFT SURAL NERVE BIOPSY;  Surgeon: Jovita Gamma, MD;  Location: [facility];  Service: Neurosurgery;  Laterality: Left;  Left sural nerve biopsy   Family History  Problem Relation Age of Onset  . Hemochromatosis Maternal Grandmother   . Migraines Maternal Grandmother   . Cancer Maternal Grandmother   . Breast cancer Maternal Grandmother   . Hypertension Father   . Diabetes Father   . Coronary artery disease Father   . Migraines Paternal Grandmother   . Breast cancer Paternal Grandmother   . Cancer Mother     breast  . Hemochromatosis Mother   . Breast cancer Mother   . Depression Mother   . Anxiety disorder Mother   . Coronary artery disease Paternal Grandfather   . Anxiety disorder Brother   . Bipolar disorder Brother   . Healthy Son   . Healthy Daughter    Social History  Substance Use Topics  . Smoking status: Former Smoker -- 0.25 packs/day for 6 years    Types: Cigarettes    Quit date: 11/29/2015  . Smokeless tobacco: Never Used  . Alcohol Use: 0.0 oz/week    0 Standard drinks or equivalent per week     Comment: once every weeks - maybe a beer with a dinner, rarely   OB History    Gravida Para Term Preterm AB TAB SAB Ectopic Multiple Living   _0 Review of Systems    Allergies  Gabapentin; Metoclopramide hcl; Tramadol; Morphine and related; Propofol; Trazodone and nefazodone; Zofran; Latex; and Tape  Home Medications   Prior to Admission medications   Medication Sig Start Date End Date Taking? Authorizing Provider  amphetamine-dextroamphetamine (ADDERALL XR) 30 MG 24 hr capsule Take 1 capsule (30 mg total) by mouth daily. 10/15/15   Cloria Spring, MD  APAP-Pamabrom-Pyrilamine (MENSTRUAL PAIN RELIEF) 500-25-15 MG TABS Take 1-2 tablets by mouth daily as needed  (for pain).    Historical Provider, MD  carbamazepine (TEGRETOL) 200 MG tablet Take two tablets at bedtime Patient taking differently: Take 400 mg by mouth at bedtime. Take two tablets at bedtime 02/11/16   Cloria Spring, MD  clonazePAM (KLONOPIN) 0.5 MG tablet Take 1 tablet (0.5 mg total) by mouth 3 (three) times daily as needed for anxiety. 02/12/16 02/11/17  Cloria Spring, MD  DULoxetine (CYMBALTA) 60 MG capsule Take 1 capsule (60 mg total) by mouth daily. 02/11/16   Cloria Spring, MD  fluconazole (DIFLUCAN) 150 MG tablet Take  1 tablet (150 mg total) by mouth once. 02/26/16   Kathyrn Drown, MD  folic acid (FOLVITE) 682 MCG tablet Take 800 mcg by mouth at bedtime.    Historical Provider, MD  HYDROcodone-acetaminophen (NORCO) 5-325 MG tablet Take 1-2 tablets by mouth every 4 (four) hours as needed (pain). 02/25/16   Jovita Gamma, MD  HYDROcodone-acetaminophen (NORCO) 7.5-325 MG tablet Take 1 tablet by mouth every 6 (six) hours as needed. Patient taking differently: Take 1 tablet by mouth every 6 (six) hours as needed for moderate pain.  02/20/16   Kathyrn Drown, MD  phenazopyridine (PYRIDIUM) 100 MG tablet Take 1 tablet (100 mg total) by mouth 3 (three) times daily as needed for pain. 02/26/16   Kathyrn Drown, MD  tiZANidine (ZANAFLEX) 4 MG tablet Take 1 tablet (4 mg total) by mouth 3 (three) times daily as needed for muscle spasms. 01/30/16   Kathyrn Drown, MD  topiramate (TOPAMAX) 50 MG tablet TAKE 1 TABLET BY MOUTH EACH MORNING AND 2 TABLETS IN THE EVENING AS NEEDED FOR MIGRAINES. 02/11/16   Kathyrn Drown, MD   BP 124/81 mmHg  Pulse 95  Temp(Src) 98.5 F (36.9 C) (Oral)  Resp 18  SpO2 96%  LMP 02/19/2016 (Exact Date) Physical Exam  ED Course  Procedures (including critical care time) Labs Review Labs Reviewed - No data to display  Imaging Review No results found. I have personally reviewed and evaluated these images and lab results as part of my medical decision-making.   EKG  Interpretation None      MDM   Final diagnoses:  None    1. Post-operative pain 2. Nonspecific rash  The patient's surgical site is unremarkable for any sign of infection. There is moderate swelling. No wound dehiscence, redness or purulent drainage. Increased pain likely due to increased activity. REcommended elevation, ice, rest.   Nonspecific rash/redness of hands, right significantly greater than left. Doubt cellulitis but will cover with keflex, short course. Recommended PCP follow up.      Charlann Lange, PA-C 03/02/16 [place], MD 03/04/16 [id number]

## 2016-02-28 NOTE — Discharge Instructions (Signed)
FOLLOW UP WITH YOUR DOCTOR AS INSTRUCTED FOR POST-OPERATIVE RECHECK. ICE AND ELEVATE THE LEFT LEG TO REDUCE SWELLING. TAKE KEFLEX AS INSTRUCTED AS A PRECAUTION AGAINST INFECTION. RETURN TO THE EMERGENCY DEPARTMENT AS NEEDED FOR WORSENING CONDITION OR NEW CONCERN.   Cryotherapy Cryotherapy means treatment with cold. Ice or gel packs can be used to reduce both pain and swelling. Ice is the most helpful within the first 24 to 48 hours after an injury or flare-up from overusing a muscle or joint. Sprains, strains, spasms, burning pain, shooting pain, and aches can all be eased with ice. Ice can also be used when recovering from surgery. Ice is effective, has very few side effects, and is safe for most people to use. PRECAUTIONS  Ice is not a safe treatment option for people with:  Raynaud phenomenon. This is a condition affecting small blood vessels in the extremities. Exposure to cold may cause your problems to return.  Cold hypersensitivity. There are many forms of cold hypersensitivity, including:  Cold urticaria. Red, itchy hives appear on the skin when the tissues begin to warm after being iced.  Cold erythema. This is a red, itchy rash caused by exposure to cold.  Cold hemoglobinuria. Red blood cells break down when the tissues begin to warm after being iced. The hemoglobin that carry oxygen are passed into the urine because they cannot combine with blood proteins fast enough.  Numbness or altered sensitivity in the area being iced. If you have any of the following conditions, do not use ice until you have discussed cryotherapy with your caregiver:  Heart conditions, such as arrhythmia, angina, or chronic heart disease.  High blood pressure.  Healing wounds or open skin in the area being iced.  Current infections.  Rheumatoid arthritis.  Poor circulation.  Diabetes. Ice slows the blood flow in the region it is applied. This is beneficial when trying to stop inflamed tissues from  spreading irritating chemicals to surrounding tissues. However, if you expose your skin to cold temperatures for too long or without the proper protection, you can damage your skin or nerves. Watch for signs of skin damage due to cold. HOME CARE INSTRUCTIONS Follow these tips to use ice and cold packs safely.  Place a dry or damp towel between the ice and skin. A damp towel will cool the skin more quickly, so you may need to shorten the time that the ice is used.  For a more rapid response, add gentle compression to the ice.  Ice for no more than 10 to 20 minutes at a time. The bonier the area you are icing, the less time it will take to get the benefits of ice.  Check your skin after 5 minutes to make sure there are no signs of a poor response to cold or skin damage.  Rest 20 minutes or more between uses.  Once your skin is numb, you can end your treatment. You can test numbness by very lightly touching your skin. The touch should be so light that you do not see the skin dimple from the pressure of your fingertip. When using ice, most people will feel these normal sensations in this order: cold, burning, aching, and numbness.  Do not use ice on someone who cannot communicate their responses to pain, such as small children or people with dementia. HOW TO MAKE AN ICE PACK Ice packs are the most common way to use ice therapy. Other methods include ice massage, ice baths, and cryosprays. Muscle creams that  cause a cold, tingly feeling do not offer the same benefits that ice offers and should not be used as a substitute unless recommended by your caregiver. To make an ice pack, do one of the following:  Place crushed ice or a bag of frozen vegetables in a sealable plastic bag. Squeeze out the excess air. Place this bag inside another plastic bag. Slide the bag into a pillowcase or place a damp towel between your skin and the bag.  Mix 3 parts water with 1 part rubbing alcohol. Freeze the mixture  in a sealable plastic bag. When you remove the mixture from the freezer, it will be slushy. Squeeze out the excess air. Place this bag inside another plastic bag. Slide the bag into a pillowcase or place a damp towel between your skin and the bag. SEEK MEDICAL CARE IF:  You develop white spots on your skin. This may give the skin a blotchy (mottled) appearance.  Your skin turns blue or pale.  Your skin becomes waxy or hard.  Your swelling gets worse. MAKE SURE YOU:   Understand these instructions.  Will watch your condition.  Will get help right away if you are not doing well or get worse.   This information is not intended to replace advice given to you by your health care provider. Make sure you discuss any questions you have with your health care provider.   Document Released: 07/19/2011 Document Revised: 12/13/2014 Document Reviewed: 07/19/2011 Elsevier Interactive Patient Education [employer].

## 2016-03-01 ENCOUNTER — Ambulatory Visit (INDEPENDENT_AMBULATORY_CARE_PROVIDER_SITE_OTHER): Payer: Medicaid Other | Admitting: Psychiatry

## 2016-03-01 ENCOUNTER — Encounter (HOSPITAL_COMMUNITY): Payer: Self-pay | Admitting: Psychiatry

## 2016-03-01 VITALS — BP 117/57 | HR 111 | Ht 67.0 in | Wt 205.0 lb

## 2016-03-01 DIAGNOSIS — F332 Major depressive disorder, recurrent severe without psychotic features: Secondary | ICD-10-CM

## 2016-03-01 DIAGNOSIS — F419 Anxiety disorder, unspecified: Secondary | ICD-10-CM

## 2016-03-01 MED ORDER — CARBAMAZEPINE 200 MG PO TABS: ORAL_TABLET | ORAL | Status: DC

## 2016-03-01 MED ORDER — AMPHETAMINE-DEXTROAMPHET ER 30 MG PO CP24: 30.0000 mg | ORAL_CAPSULE | Freq: Every day | ORAL | Status: DC

## 2016-03-01 MED ORDER — DULOXETINE HCL 60 MG PO CPEP: 60.0000 mg | ORAL_CAPSULE | Freq: Two times a day (BID) | ORAL | Status: DC

## 2016-03-01 MED ORDER — CLONAZEPAM 0.5 MG PO TABS: 0.5000 mg | ORAL_TABLET | Freq: Three times a day (TID) | ORAL | Status: DC | PRN
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
## 2016-03-04 NOTE — Telephone Encounter (Signed)
Patient wants to talk to [facility] please call [phone]

## 2016-03-08 NOTE — Telephone Encounter (Signed)
Patient called wanting to know information about her nerve bx.  Informed her that I have not seen anything come through and instructed her to call their office.

## 2016-03-09 ENCOUNTER — Telehealth: Payer: Self-pay | Admitting: Family Medicine

## 2016-03-09 MED ORDER — HYDROCODONE-ACETAMINOPHEN 7.5-325 MG PO TABS: 1.0000 | ORAL_TABLET | Freq: Four times a day (QID) | ORAL | Status: DC | PRN

## 2016-03-09 MED ORDER — TOPIRAMATE 50 MG PO TABS: ORAL_TABLET | ORAL | Status: DC

## 2016-03-09 NOTE — Telephone Encounter (Signed)
hydrocod refill times 1, topamax  Times 5

## 2016-03-09 NOTE — Telephone Encounter (Signed)
Patient called to check on this message.  She said she has someone who will pick this up.

## 2016-03-09 NOTE — Telephone Encounter (Signed)
Spoke with patient and informed her per Dr.Scott Luking- refill for hydrocodone is ready for pick up and refills on Topamax was sent to pharmacy.Patient verbalized understanding.

## 2016-03-09 NOTE — Telephone Encounter (Signed)
Patient requesting refill on hydrocodone 7.5/325 mg and topamax 50 mg.

## 2016-03-10 ENCOUNTER — Telehealth: Payer: Self-pay | Admitting: *Deleted

## 2016-03-10 NOTE — Telephone Encounter (Signed)
I called the lab for results and they are not in yet.  They will fax results to [name] and Dr. Sherwood Gambler when they are ready.

## 2016-03-11 ENCOUNTER — Telehealth: Payer: Self-pay | Admitting: Neurology

## 2016-03-11 NOTE — Telephone Encounter (Signed)
Left sural nerve biopsy results rec'd 02/25/2016:  Focal myelin swellings, demyelinating/remyelinating findings, and instances of acute axonopathy. Comment:  These changes are not entirely specific and can be seen in hereditary neuropathy with pressure palsies or other hereditary motor sensory neuropathies.  There can also be seen to some degree with demyelinating/remyelinating diseases.  Clinical correlation is essential.  I attempted to contact patient via phone today regarding the results of nerve biopsy, however there was no answer so a message was left for the patient to return my call.    Beauty Pless K. Posey Pronto, DO

## 2016-03-14 IMAGING — MR MR HEAD WO/W CM
10 of 12 series · 32 of 48 positions shown · IV contrast (multihance)
Comparison: MRI of the head November 27, 2015

CLINICAL DATA: Progressive lower extremity numbness, severe
neuropathy, subsequent worsening. Possible autoimmune disease.

EXAM:
MRI HEAD WITHOUT AND WITH CONTRAST
TECHNIQUE: Multiplanar, multiecho pulse sequences of the brain and surrounding
structures were obtained without and with intravenous contrast.
CONTRAST:  20mL MULTIHANCE GADOBENATE DIMEGLUMINE 529 MG/ML IV SOLN

[Series 3: T1 · sagittal · 5.0mm · 0.47mm/px · 2 of 23 slices shown]
[im 1/23]
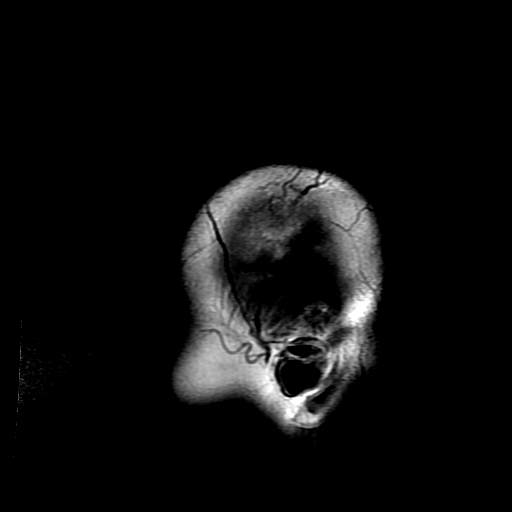
[im 23/23]
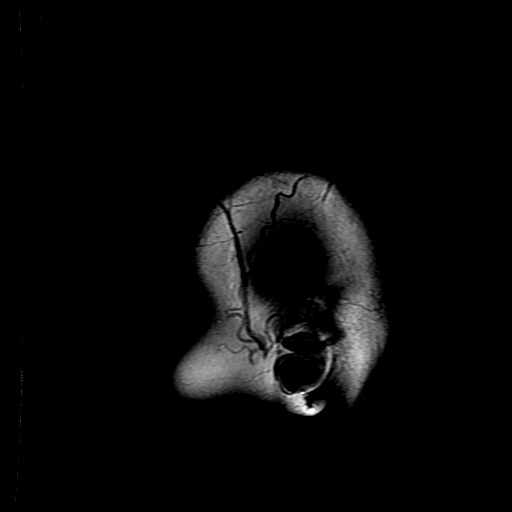

[Series 4: DWI · axial · 3.0mm · 1.09mm/px · z∈[-89,+49]mm · 8 of 98 slices shown (1 of 4)]
[im 1/98]
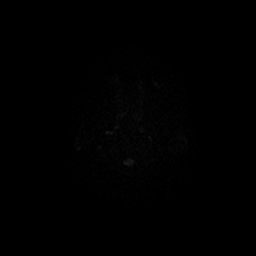
[im 14/98]
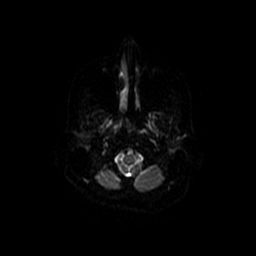
[im 28/98]
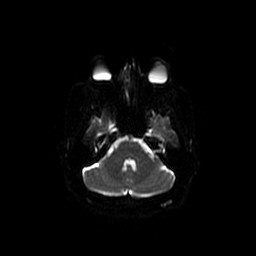
[im 42/98]
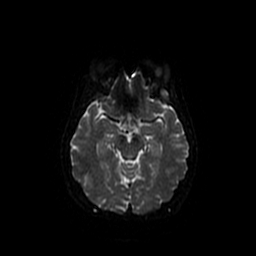
[im 56/98]
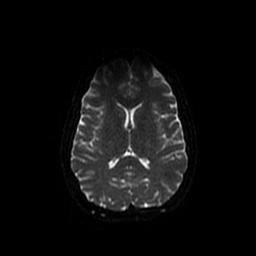
[im 70/98]
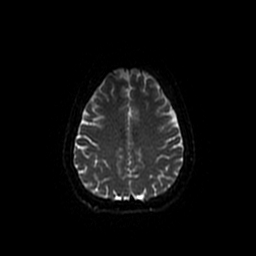
[im 84/98]
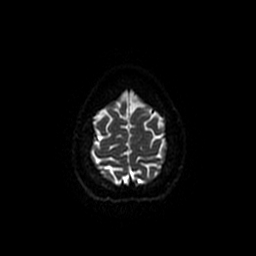
[im 98/98]
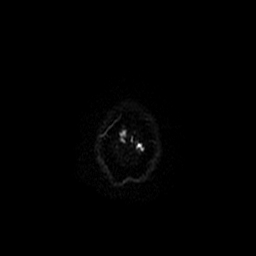

[Series 5: DWI · coronal · 5.0mm · 1.09mm/px · 5 of 66 slices shown (2 of 4)]
[im 1/66]
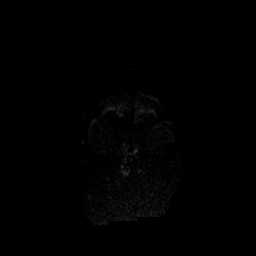
[im 17/66]
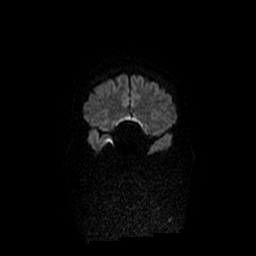
[im 33/66]
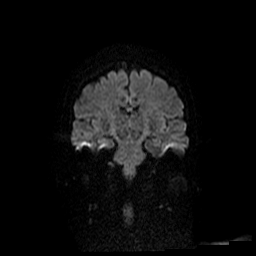
[im 49/66]
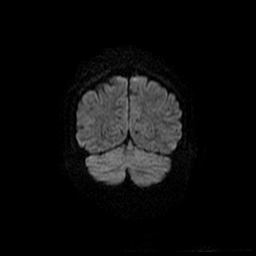
[im 66/66]
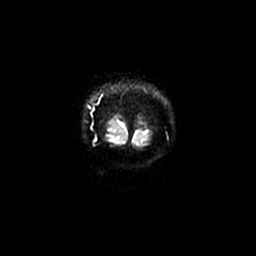

[Series 6: T2 · axial · 5.0mm · 0.43mm/px · z∈[-82,+55]mm · 2 of 25 slices shown (1 of 2)]
[im 1/25]
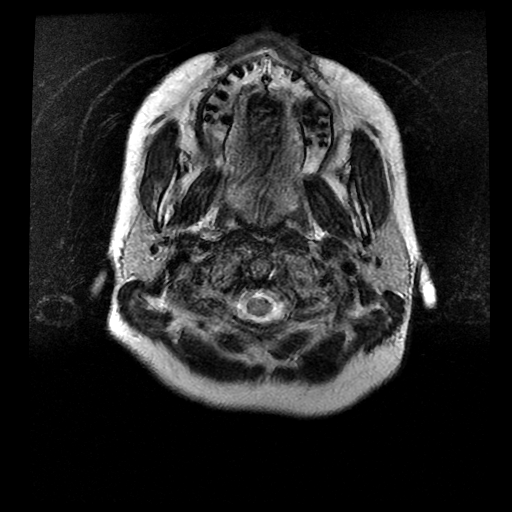
[im 25/25]
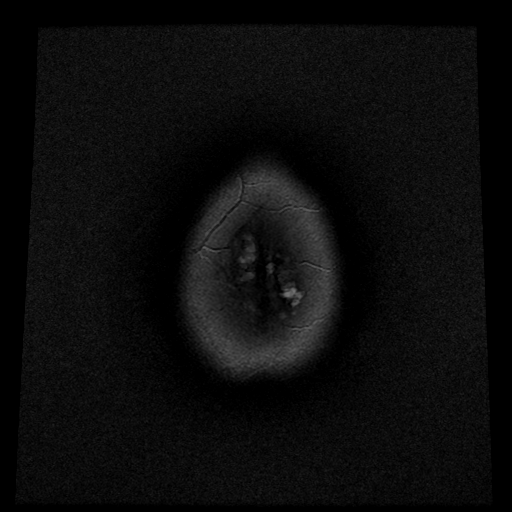

[Series 7: FLAIR · axial · 5.0mm · 0.43mm/px · z∈[-82,+55]mm · 2 of 25 slices shown]
[im 1/25]
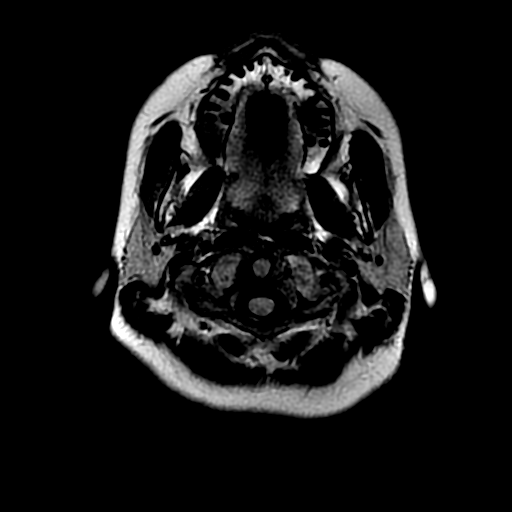
[im 25/25]
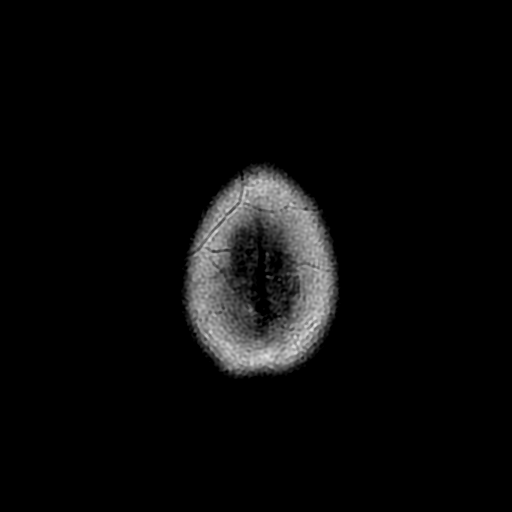

[Series 8: ax mpgr · axial · 5.0mm · 0.43mm/px · z∈[-82,+55]mm · 2 of 25 slices shown]
[im 1/25]
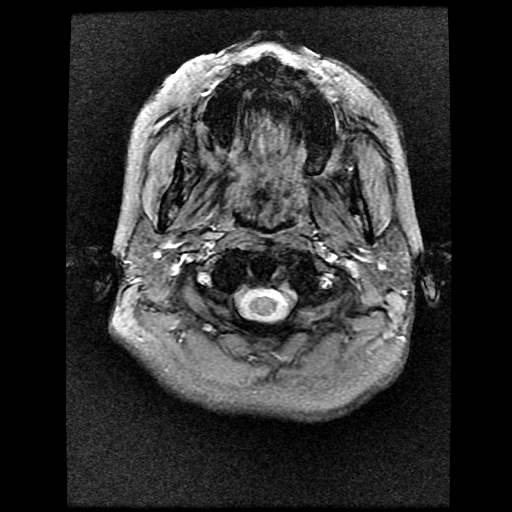
[im 25/25]
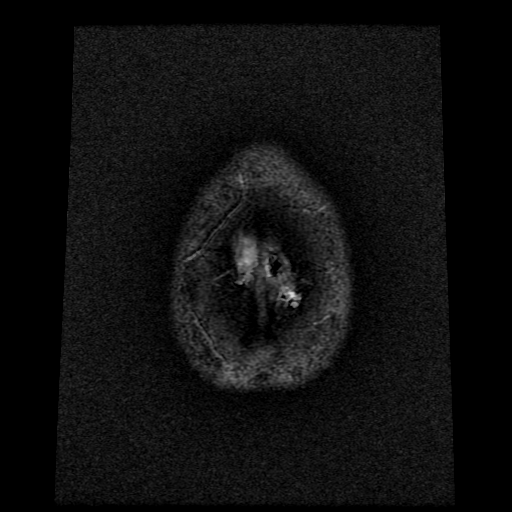

[Series 10: T2 · coronal · 5.0mm · 0.39mm/px · 2 of 25 slices shown (2 of 2)]
[im 1/25]
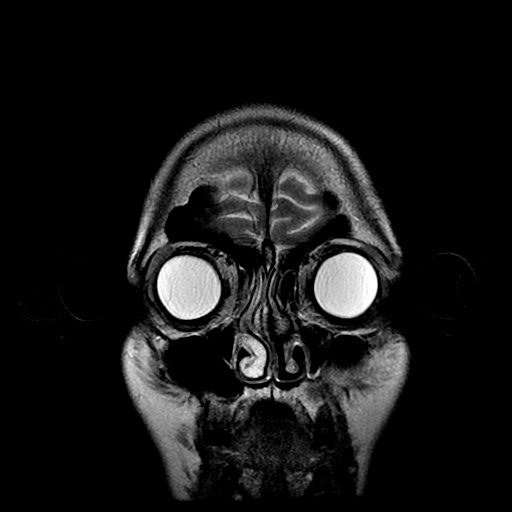
[im 25/25]
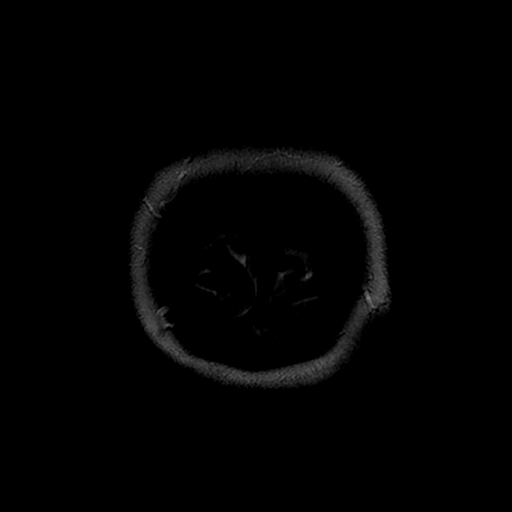

[Series 51: T1 post-contrast · coronal · 5.0mm · 0.43mm/px · 2 of 27 slices shown]
[im 1/27]
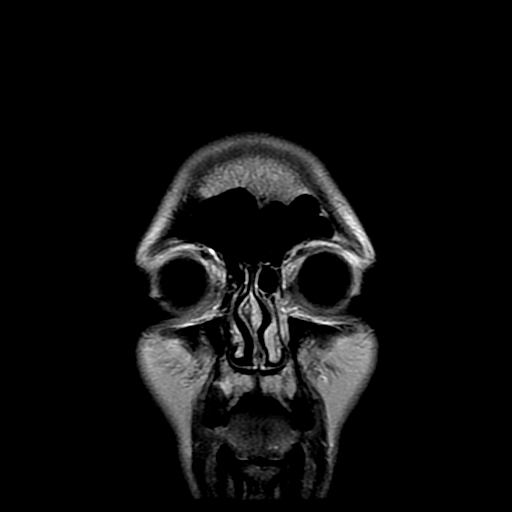
[im 27/27]
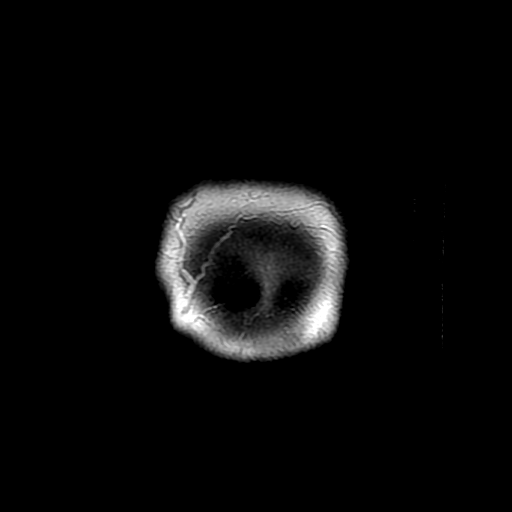

[Series 400: DWI · axial · 3.0mm · 1.09mm/px · z∈[-89,+49]mm · 4 of 49 slices shown (3 of 4)]
[im 1/49]
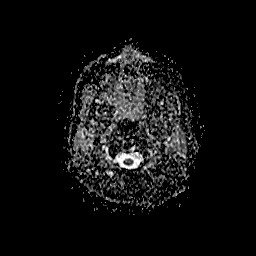
[im 17/49]
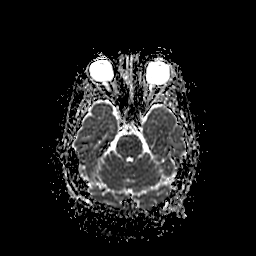
[im 33/49]
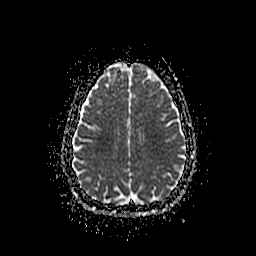
[im 49/49]
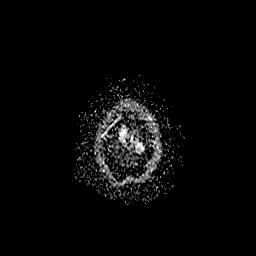

[Series 500: DWI · coronal · 5.0mm · 1.09mm/px · 3 of 33 slices shown (4 of 4)]
[im 1/33]
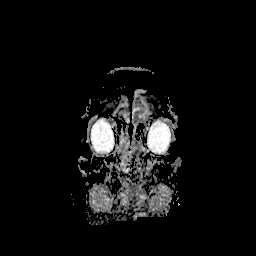
[im 17/33]
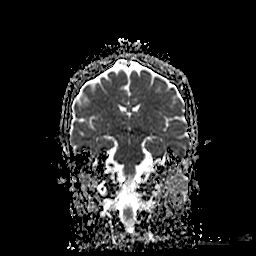
[im 33/33]
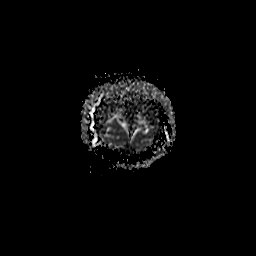

[32 of 48 positions shown; findings below may reference images not displayed]

FINDINGS: The ventricles and sulci are normal for patient's age. No abnormal
parenchymal signal, mass lesions, mass effect. No abnormal
parenchymal enhancement. No reduced diffusion to suggest acute
ischemia. No susceptibility artifact to suggest hemorrhage.

No abnormal extra-axial fluid collections. No extra-axial masses nor
leptomeningeal enhancement. Normal major intracranial vascular flow
voids seen at the skull base. 5 mm pineal cyst.

Ocular globes and orbital contents are unremarkable though not
tailored for evaluation. No suspicious calvarial bone marrow signal.
No abnormal sellar expansion. Craniocervical junction maintained.
Mild paranasal sinus mucosal thickening. Mastoid air cells are well
aerated.
IMPRESSION: 6 mm pineal cyst, otherwise unremarkable MRI of the brain with and
without contrast.

## 2016-03-15 ENCOUNTER — Encounter (HOSPITAL_COMMUNITY): Payer: Self-pay

## 2016-03-15 ENCOUNTER — Encounter: Payer: Self-pay | Admitting: Family Medicine

## 2016-03-15 ENCOUNTER — Telehealth: Payer: Self-pay | Admitting: Neurology

## 2016-03-15 NOTE — Telephone Encounter (Signed)
Patient coming in tomorrow.

## 2016-03-15 NOTE — Telephone Encounter (Signed)
Penny [name] [birth year] called and was wanting to check on her lab results. Her call back number is [phone]. Thank you

## 2016-03-16 ENCOUNTER — Encounter: Payer: Self-pay | Admitting: Neurology

## 2016-03-16 ENCOUNTER — Ambulatory Visit (INDEPENDENT_AMBULATORY_CARE_PROVIDER_SITE_OTHER): Payer: Medicaid Other | Admitting: Neurology

## 2016-03-16 VITALS — BP 96/62 | HR 65 | Ht 66.0 in | Wt 201.0 lb

## 2016-03-16 DIAGNOSIS — G609 Hereditary and idiopathic neuropathy, unspecified: Secondary | ICD-10-CM

## 2016-03-16 NOTE — Progress Notes (Signed)
Follow-up Visit   Date: 03/16/2016    Penny [name] MRN: [id number] DOB: [birth year]   Interim History: Penny [name] is a 30 y.o. right-handed Caucasian female with with major depression, migraines, ADHD, anixety, gastric bypass surgery (2007), fibromyalgia, current tobacco use, and nonepileptic spells returning to the clinic for follow-up of neuropathy.  The patient was accompanied to the clinic by mother who also provides collateral information.    History of present illness: She reports having a complicated pregnancy with her daughter and developed preeclampsia and was induced at 34.5 weeks (August 2015). She reports having two epidurals which were not completely successful. She reports having difficulty delivering her placenta and was eventually taken to surgery to remove the products. She was in the lithotomy position for 4 hours and was taken to the ICU. The following morning, he had a fall due to left leg weakness. She had back and left leg pain which followed. Imaging of her low back was normal. She was discharged home with a walker and slowly improved over the following weeks.   Starting in October 2016, she had a fall and initially attributed it to ankle instability, but this slowly progressed and she began having more frequent falls. In December 2016, she develop relatively acute onset of left leg weakness/numbness causing another fall and inability to walk which prompted her to go to the emergency department. Over the preceding few days, she also began experiencing similar symptoms over the right leg and numbness of the left side of her face. She also complains of urinary incontinence, described more as urge incontinence as she feels the need to go but cannot control it. She was admitted to [facility] on 12/21 - 12/22/ where she presented with progressively worsening left leg weakness, numbness, and falls. She had MRI of the brain, cervical, and lumbar spine  which essentially was nondiagnostic. Labs indicated folate deficiency and low ferritin levels. She had mild elevation in AST.  When she came to see me in January, she complained of low back pain, entire left leg numbness and weakness, falls, and paresthesias of the hands and face.  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 She also has mild transaminitis and followed at [facility]. She also has major depression, anxiety, ADHD, and insomnia and takes tegretol for mood stabilization, Cymbalta for depression, trazodone for sleep. For anxiety and ADHD, she takes Adderall XR 30 mg daily.  UPDATE 01/01/2015:  Since her last visit, she underwent CSF testing and lab testing which has essentially returned normal, except for very mild elevation in ANA (1:80 speckled pattern).   She had one ER visit on 1/22 for prolonged "seizure" lasting two hours. 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 She has been given hydrocodone by her PCP and reports that her pain is much better controlled and she is able to do more.  She continues to have left arm heaviness and paresthesias, but overall feels that her leg paresthesias and weakness are improved and attributes this to better pain control.  UPDATE 02/02/2016:  Patient called the office on 2/17 states that she was unable to move her legs and having urinary incontinenece and was previously doing much better.  She was instructed to go to the ER for evaluation and was admitted for work-up.  Contrasted MRI of the entire neuroaxis was performed and returned essentially normal.  Folate levels are improved.  Transfer to [facility] for evaluation was denied and patient was discharged home.  Since being home, she has been able to move her legs and although unsteady, is walking independently again.  She continues to have paresthesias of the arms and legs, worse on the left.     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 She underwent nerve biopsy on 3/22 and reports that she has ankle swelling and her incision keeps opening up at times.  She was instructed to limit weight bearing and keep her foot elevated, but she is busy with her children so cannot keep it rested.  She complains of uncontrolled pain and is trying to get in with pain management.    Her maternal uncle, now deceased at the age of 68 due to cardiac disease, had polyneuropathy but also had 20+ year history of alcoholism and was later diagnosed with multiple sclerosis.   Her paternal grandfather and father have diabetic neuropathy.    Medications:  Current Outpatient Prescriptions on File Prior to Visit  Medication Sig Dispense Refill  . amphetamine-dextroamphetamine (ADDERALL XR) 30 MG 24 hr capsule Take 1 capsule (30 mg total) by mouth daily. 30 capsule 0  . APAP-Pamabrom-Pyrilamine (MENSTRUAL PAIN RELIEF) 500-25-15 MG TABS Take 1-2 tablets by mouth daily as needed (for pain).    . carbamazepine (TEGRETOL) 200 MG tablet Take two tablets at bedtime 60 tablet 2  . clonazePAM (KLONOPIN) 0.5 MG tablet Take 1 tablet (0.5 mg total) by mouth 3 (three) times daily as needed for anxiety. 90 tablet 2  . DULoxetine (CYMBALTA) 60 MG capsule Take 1 capsule (60 mg total) by mouth 2 (two) times daily. 60 capsule 2  . folic acid (FOLVITE) 161 MCG tablet Take 800 mcg by mouth at bedtime.    Marland Kitchen HYDROcodone-acetaminophen (NORCO) 7.5-325 MG tablet Take 1 tablet by mouth every 6 (six) hours as needed. 56 tablet 0  . phenazopyridine (PYRIDIUM) 100 MG tablet Take 1 tablet (100 mg total) by mouth 3 (three) times daily as needed for pain. 21 tablet 3  . topiramate (TOPAMAX) 50 MG tablet TAKE 1 TABLET BY MOUTH EACH MORNING AND 2 TABLETS IN THE EVENING AS NEEDED FOR MIGRAINES. 90 tablet 5   No current facility-administered medications  on file prior to visit.    Allergies:  Allergies  Allergen Reactions  . Gabapentin Other (See Comments)    Pt states that she was unresponsive after taking this medication, but her vitals remained stable.    . Metoclopramide Hcl Anxiety and Other (See Comments)    Pt states that she felt like she was trapped in a box, and could not get out.  Pt also states that she had temporary loss of movement, weakness, and tingling.    . Tramadol Other (See Comments)    Seizures  . Morphine And Related Other (See Comments)    Chest pain   . Propofol Nausea And Vomiting  . Trazodone And Nefazodone Other (See Comments)    Makes pt "like a zombie"  . Zofran [Ondansetron] Other (See Comments)    Migraines   . Latex Rash  . Tape Itching and Rash    Please use paper tape    Review of Systems:  CONSTITUTIONAL: No fevers, chills, night sweats, or weight loss.  EYES: No visual changes or eye pain ENT: No hearing changes.  No history of nose bleeds.   RESPIRATORY: No  cough, wheezing and shortness of breath.   CARDIOVASCULAR: Negative for chest pain, and palpitations.   GI: Negative for abdominal discomfort, blood in stools or black stools.  No recent change in bowel habits.   GU:  No history of incontinence.   MUSCLOSKELETAL: No history of joint pain or swelling.  No myalgias.   SKIN: Negative for lesions, rash, and itching.   ENDOCRINE: Negative for cold or heat intolerance, polydipsia or goiter.   PSYCH:  + depression or anxiety symptoms.   NEURO: As Above.   Vital Signs:  BP 96/62 mmHg  Pulse 65  Ht '5\' 6"'$  (1.676 m)  Wt 201 lb (91.173 kg)  BMI 32.46 kg/m2  LMP 02/19/2016 (Exact Date)   General: Tearful at times, comfortable. Ext:  Left lateral ankle incision from nerve biopsy is dry and healing.  There is mild erythema and swelling around the joint.    Neurological Exam: MENTAL STATUS including orientation to time, place, person, recent and remote memory, attention span and  concentration, language, and fund of knowledge is normal.    CRANIAL NERVES:   Pupils equal round and reactive to light.  Normal conjugate, extra-ocular eye movements in all directions of gaze.  Normal facial sensation.  Face is symmetric.   MOTOR:  No atrophy, fasciculations or abnormal movements.  No pronator drift.    Right Upper Extremity:    Left Upper Extremity:    Deltoid  5/5   Deltoid  5/5   Biceps  5/5   Biceps  5/5   Triceps  5/5   Triceps  5/5   Wrist extensors  5/5   Wrist extensors  5/5   Wrist flexors  5/5   Wrist flexors  5/5   Finger extensors  5/5   Finger extensors  5/5   Finger flexors  5/5   Finger flexors  5/5   Dorsal interossei  4/5   Dorsal interossei  4/5   Abductor pollicis  4/5   Abductor pollicis  4/5   Tone (Ashworth scale)  0  Tone (Ashworth scale)  0   Right Lower Extremity:    Left Lower Extremity:    Hip flexors  5/5   Hip flexors  5-/5   Hip extensors  5/5   Hip extensors  5/5   Knee flexors  5/5   Knee flexors  5/5   Knee extensors  5/5   Knee extensors  5-/5   Inversion 5/5  Inversion 4/5  Eversion 5/5  Eversion 4/5  Dorsiflexors  5/5   Dorsiflexors  5/5   Plantarflexors  5/5   Plantarflexors  5/5   Toe extensors  5/5   Toe extensors  4/5   Toe flexors  5/5   Toe flexors  4-/5   Tone (Ashworth scale)  0  Tone (Ashworth scale)  0   MSRs:  Reflexes are 2+/4 throughout, except absent at the left ankle.    SENSORY:  Sensation to all modalities intact in the upper extremities.  Vibration, pin prick, and temperature is reduced over the entire left lower leg and foot.   COORDINATION/GAIT:   Gait is antalgic, wide-based and mildly unsteady, unassisted.  Data: Labs 11/27/2015: ESR 19, CRP <0.5, ferritin 5*, folate 4.9**, vitamin B12 410, TSH 0.977 Labs 11/15/2015: AST 45*, ALT 47, Cr 0.69 Labs 12/12/2015:  c/p-ANCA negative, SPEP/UPEP with IFE no M protein, SSA/B neg, ANA 1:80 speckled*, RPR neg, copper 118, cryoglobulin negative, selenium 127, zinc  69 Labs 12/31/2015:  Lead neg,  arsenic 6 (normal <15), mercury 5.0 (normal <20), dsDNA neg Labs 01/24/2016:  Folate RBC 886  CSF testing 12/19/2015:  W1 R0 G63  P28   ACE 11, no OCB, Lyme neg, IgG index 0.42  MRI brain wo contrast 11/27/2015:  1. No acute intracranial abnormality.  2. 6 mm pineal cyst, otherwise unremarkable appearance of the brain.  MRI cervical spine wwo contrast 11/27/2015: 1. No acute abnormality within the cervical spine. No significant stenosis or cord signal abnormality. 2. Posterior disc protrusion at C4-5 without significant stenosis or evidence of neural impingement. 3. Tiny central disc protrusion at C6-7 without stenosis.  MRI lumbar spine wo contrast 11/27/2015: 1. Stable appearance of the lumbar spine. No evidence of severe stenosis or cord compression. No new disc herniation. 2. Small RIGHT paracentral disc protrusion at L3-4 without stenosis or neural impingement. 3. Small RIGHT paracentral disc protrusion at L4-5 without stenosis or evidence of neural impingement.  MRI brain, cervical spine, thoracic spine, and lumbar spine wwo contrast 01/24/2016: MRI BRAIN:  6 mm pineal cyst, otherwise unremarkable MRI of the brain with and without contrast. MRI CERVICAL SPINE:    Mildly motion degraded examination.  Stable small central disc protrusion at C4-5, otherwise unremarkable MRI THORACIC SPINE: Mild motion degraded examination.  Old mild T8 compression fracture with otherwise unremarkable MRI of the thoracic spine with and without contrast. MRI LUMBAR SPINE: Stable small L3-4 disc protrusion and small L4-5 contiguous disc extrusion.  NCS/EMG of the left leg 12/12/2015:  This is a limited study as needed electrode examination was prematurely terminated due to pain. 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 Unfortunately, needle electrode examination of proximal muscles was not  performed however the appropriate clinical setting, evaluation for polyradiculoneuropathy should be considered.  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 Overall, these findings are severe in degree electrically; however, there is mild interval improvement in the lower extremities when compared to her electrodiagnostic testing dated 12/11/2015.  Left sural nerve biopsy results 02/25/2016: Focal myelin swellings, demyelinating/remyelinating findings, and instances of acute axonopathy. Comment: These changes are not entirely specific and can be seen in hereditary neuropathy with pressure palsies or other hereditary motor sensory neuropathies. There can also be seen to some degree with demyelinating/remyelinating diseases. Clinical correlation is essential.  IMPRESSION/PLAN: In brief, Ms. Viens is a 30 year-old female returning for evaluation of subacute onset of left > right leg weakness, painful paresthesias, and falls since October 2016. She had underwent extensive testing including NCS/EMG which showed a subacute severe polyneuropathy of the legs. Because of her NCS findings, extensive CSF and lab testing was ordered and has returned largely nondiagnostic. She was noted to have folate deficiency and started supplementation. 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 Overall, these findings are severe in degree electrically; however, there is mild interval improvement in the lower extremities when compared to her electrodiagnostic testing dated 12/11/2015.  Due concern of an inflammatory/infiltrative disease, she underwent left sural nerve biopsy which, to my  surprise, is most consistent with a hereditary neuropathy. Although we discussed this possibility at previous visits, the subacute and asymmetrical presentation was more suggestive of an acquired neuropathy.  We discussed the diagnosis  of hereditary neuropathy and that genetic testing would be the next step.  Prior to any genetic testing, she was instructed to contact her insurance company and determine whether genetic testing is covered under her plan.    Because she does not tolerate typical medications used for neuropathy, she will be seeing pain management for pain control. Recommend left AFO for her ankle instability  She also will be seeking a second opinion at [facility] later this month.  If genetic testing is recommended, she may benefit from seeing a genetic counselor there also as these services are limited locally.    The duration of this appointment visit was 45 minutes of face-to-face time with the patient.  Greater than 50% of this time was spent in counseling, explanation of diagnosis, planning of further management, and coordination of care.   Thank you for allowing me to participate in patient's care.  If I can answer any additional questions, I would be pleased to do so.    Sincerely,    Donika K. Posey Pronto, DO

## 2016-03-17 ENCOUNTER — Telehealth: Payer: Self-pay | Admitting: *Deleted

## 2016-03-17 ENCOUNTER — Telehealth: Payer: Self-pay | Admitting: Family Medicine

## 2016-03-17 ENCOUNTER — Other Ambulatory Visit: Payer: Self-pay | Admitting: Family Medicine

## 2016-03-17 DIAGNOSIS — D509 Iron deficiency anemia, unspecified: Secondary | ICD-10-CM

## 2016-03-17 MED ORDER — HYDROCODONE-ACETAMINOPHEN 10-325 MG PO TABS: 1.0000 | ORAL_TABLET | Freq: Four times a day (QID) | ORAL | Status: DC | PRN

## 2016-03-17 NOTE — Telephone Encounter (Signed)
Pt is wanting to speak with you regarding her pn mgmt referral  She is wanting to know if this is going to be done sometime in  The near future. She states the med she is on for this pn is not working And that you are well aware of that.   Please advise

## 2016-03-17 NOTE — Telephone Encounter (Signed)
Patient request a call concerning her pain management. Patient states she is getting no relief from pain from her current pain medication and wants to discuss other options. Patient request a call from Dr Nicki Reaper tonight concerning this matter

## 2016-03-17 NOTE — Telephone Encounter (Signed)
FYI - I spoke with patient Monday 03/15/16, explained in detail that referral was faxed to Dr. Merlene Laughter for pain management referral on 03/15/16. (Dr. Posey Pronto originally initiated referral to Dr. Merlene Laughter for pain management, however with the patient having [facility] - we had to do referral due to being her PCP Her Medicaid card had the wrong office listed as [place] [facility] until 03/06/16 when it was corrected.) Explained to patient that someone from Dr. Freddie Apley office would be contacting her to schedule appointment, pt verbalized understanding

## 2016-03-17 NOTE — Progress Notes (Signed)
See documentation from today's phone call for 12 2017

## 2016-03-17 NOTE — Telephone Encounter (Signed)
This patient has unfortunate diagnosis which does cause legitimate pain but at the same time this patient can be quite challenging in management. I would recommend that once pain management takes her over at we will not be prescribing pain medicines here.FYI

## 2016-03-17 NOTE — Telephone Encounter (Signed)
Although I am sympathetic with the patient's situation increasing her medication to a hydrocodone 10 mg/325 mg 1 4 times a day is the maximum that I will do. We have tried our best to try to get this patient into pain management. 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 The patient will need to make do on hydrocodone 10 mg/325 one tablet 4 times a day. Also patient will need to schedule office visit to discuss further. Pain management needs to be handled in office visit. Patient needs to schedule an office visit.

## 2016-03-17 NOTE — Telephone Encounter (Signed)
Medicaid card is correct as of 03/06/16, faxed referral to [facility] @ Dr. Freddie Apley office on 03/15/16, she will contact pt to schedule, this information was also given to the patient 03/15/16, pt verbalized understanding

## 2016-03-18 ENCOUNTER — Other Ambulatory Visit: Payer: Self-pay | Admitting: Family Medicine

## 2016-03-18 ENCOUNTER — Other Ambulatory Visit (HOSPITAL_COMMUNITY): Payer: Self-pay | Admitting: Psychiatry

## 2016-03-18 NOTE — Telephone Encounter (Signed)
Discussed with patient. Patient advised Although Dr Nicki Reaper is sympathetic with the patient's situation increasing her medication to a hydrocodone 10 mg/325 mg 1 4 times a day is the maximum that he will do. We have tried our best to try to get this patient into pain management. 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 The patient will need to make do on hydrocodone 10 mg/325 one tablet 4 times a day. Patient verbalized understanding. Patient states she would like to move forward with pain management in [place].

## 2016-03-18 NOTE — Telephone Encounter (Signed)
Left message to return call 

## 2016-03-18 NOTE — Telephone Encounter (Signed)
Patient also inquiring about referral for iron infusions-Patient states it was discussed at visit 2/17 but she has not heard anything about an appt.yet. Last CBC was 02/24/16 and hgb was 10.4

## 2016-03-18 NOTE — Telephone Encounter (Signed)
Ok three ref 

## 2016-03-19 ENCOUNTER — Encounter: Payer: Self-pay | Admitting: Family Medicine

## 2016-03-19 NOTE — Telephone Encounter (Signed)
Referral ordered in EPIC. Message forwarded to referral coordinator.

## 2016-03-19 NOTE — Addendum Note (Signed)
Addended by: Dairl Ponder on: 03/19/2016 08:51 AM   Modules accepted: Orders

## 2016-03-19 NOTE — Telephone Encounter (Signed)
#  1 please set up patient with referral to hematology Dr. Whitney Muse for iron deficiency anemia with the need for aren't infusions #2 please forward message regarding referral to pain Center in [place] to [facility] for further management thank you

## 2016-03-23 ENCOUNTER — Telehealth: Payer: Self-pay | Admitting: Family Medicine

## 2016-03-23 NOTE — Telephone Encounter (Signed)
FYI - Fax received from Dr. Merlene Laughter, appt for pain management is scheduled for 03/31/16 @ 3:00pm, they schedule directly with pt  (Pt's information was also sent to [facility] in [place] 03/18/16 for pain management referral per pt request - they will review & contact pt to schedule, if they decide no to take her case, they will notify us)

## 2016-03-23 NOTE — Telephone Encounter (Signed)
Regardless of who the patient sees she will need to be on her best approach toward her own health to avoid being dismissed

## 2016-03-24 ENCOUNTER — Telehealth: Payer: Self-pay | Admitting: Neurology

## 2016-03-24 ENCOUNTER — Telehealth: Payer: Self-pay | Admitting: *Deleted

## 2016-03-24 ENCOUNTER — Other Ambulatory Visit: Payer: Self-pay | Admitting: *Deleted

## 2016-03-24 MED ORDER — TIZANIDINE HCL 2 MG PO CAPS: 2.0000 mg | ORAL_CAPSULE | Freq: Three times a day (TID) | ORAL | Status: DC | PRN

## 2016-03-24 NOTE — Telephone Encounter (Signed)
Records faxed to [phone]

## 2016-03-24 NOTE — Telephone Encounter (Signed)
Zanaflex 2mg  1 three times a day as needed. #30 tablets sent to patient's pharmacy per Dr.Scott Luking.

## 2016-03-24 NOTE — Telephone Encounter (Signed)
Incoming fax from [place] [facility] requesting refill on tizandidine 4mg  #30 one tid prn muscle spasams. Pt last seen 01/30/16. Med is not on med list. last dispensed 02/24/16.

## 2016-03-24 NOTE — Telephone Encounter (Signed)
Pt needs all of her records sent to Dr Tilman Neat at [facility] she has a appt tomorrow to this dr.. Dr Celesta Aver phone number is [phone].. The MRN for Penny [name] is [id number]

## 2016-03-24 NOTE — Telephone Encounter (Signed)
Given the patient is on narcotic pain medicine and on nerve medication I do not recommend Zanaflex at that dose I would recommend 2 mg infrequently one 3 times a day when necessary #30

## 2016-03-29 ENCOUNTER — Telehealth: Payer: Self-pay | Admitting: Neurology

## 2016-03-29 NOTE — Telephone Encounter (Signed)
PT called and left a message in regards to her appointment with [facility]# [phone]

## 2016-03-30 ENCOUNTER — Other Ambulatory Visit: Payer: Self-pay | Admitting: Family Medicine

## 2016-03-30 DIAGNOSIS — M25552 Pain in left hip: Secondary | ICD-10-CM

## 2016-03-30 MED ORDER — HYDROCODONE-ACETAMINOPHEN 10-325 MG PO TABS: 1.0000 | ORAL_TABLET | Freq: Four times a day (QID) | ORAL | Status: DC | PRN

## 2016-03-30 NOTE — Telephone Encounter (Signed)
Checking on this, advised he was busy and would get back to her when he can

## 2016-03-30 NOTE — Telephone Encounter (Signed)
May have refill, once dr Merlene Laughter assumes control this will be under his guidance

## 2016-03-30 NOTE — Telephone Encounter (Signed)
Notified patient script ready for pickup and Dr. Nicki Reaper recommends ortho doctor for hip issue. Patient verbalized understanding. Referral initiated in the system.

## 2016-03-30 NOTE — Telephone Encounter (Signed)
Patient was seen at [facility] and they want to repeat all of the tests that she has had done.  She is very discouraged and can't afford to re-do all of the tests.  They do not want to see her back until 3 months.  Any thing we can do?

## 2016-03-30 NOTE — Telephone Encounter (Signed)
Patient is requesting refill on hydrocodone 10/325 .She wanted you to know her hip is shifting causing her left knee to hurt and she wanted to know if she needed a brace or been seen by orthopedic doctor.

## 2016-04-01 ENCOUNTER — Telehealth (HOSPITAL_COMMUNITY): Payer: Self-pay | Admitting: *Deleted

## 2016-04-01 ENCOUNTER — Telehealth: Payer: Self-pay | Admitting: Family Medicine

## 2016-04-01 DIAGNOSIS — G894 Chronic pain syndrome: Secondary | ICD-10-CM

## 2016-04-01 NOTE — Telephone Encounter (Signed)
Pt called, states she went to appointment with Dr. Merlene Laughter yesterday, states she's not satisfied with him, waited for 2.5 hours in waiting room for him to come in to the room & basically say that he feels he has nothing to offer her for treatment  Pt requesting referral to [place] for pain management, states that Dr. Rita Ohara did her nerve biopsy & she noticed that their office offers pain management, explained to pt that I would have to run this by Dr. Nicki Reaper but would be happy to work on referral once he gave "ok"  (I also referred patient to [facility] to their pain management department, have not heard back as to if they are going to be able to see pt, but I have called  & [facility] to check status of referral)

## 2016-04-01 NOTE — Telephone Encounter (Signed)
Pt threw away scripts for controlled meds. We cannot refill them early

## 2016-04-01 NOTE — Telephone Encounter (Signed)
Please advise 

## 2016-04-01 NOTE — Telephone Encounter (Signed)
Voice message from [place] at pharmacy.   Need someone to call regarding patient.

## 2016-04-01 NOTE — Telephone Encounter (Signed)
May have referral , I'm not sure that they will see her given that this is peripheral neuropathy pain but it is fine to initiate referral ( I am pessimistic patient will find a pain management center to her preferences.)

## 2016-04-01 NOTE — Telephone Encounter (Signed)
Unfortunately, there is nothing that we can do.  She can inquire if there is a Programmer, applications at [facility].  Chassity Ludke K. Posey Pronto, DO

## 2016-04-02 ENCOUNTER — Ambulatory Visit: Payer: Self-pay | Admitting: Neurology

## 2016-04-02 ENCOUNTER — Encounter: Payer: Self-pay | Admitting: Family Medicine

## 2016-04-02 NOTE — Telephone Encounter (Signed)
Left message giving patient suggestion per Dr. Posey Pronto.

## 2016-04-02 NOTE — Telephone Encounter (Signed)
Called pt and lm and called pt pharmacy and spoke with [facility]. Per [place], medication was Adderall XR and it looks like according to [place] notes, pt had lost her printed script but she will have Ovid Curd call office when he gets into work later on.

## 2016-04-02 NOTE — Telephone Encounter (Signed)
Called pharmacy and was told Ovid Curd was at lunch

## 2016-04-05 ENCOUNTER — Other Ambulatory Visit (HOSPITAL_COMMUNITY): Payer: Self-pay | Admitting: Psychiatry

## 2016-04-05 ENCOUNTER — Telehealth (HOSPITAL_COMMUNITY): Payer: Self-pay | Admitting: *Deleted

## 2016-04-05 MED ORDER — AMPHETAMINE-DEXTROAMPHET ER 30 MG PO CP24: 30.0000 mg | ORAL_CAPSULE | Freq: Every day | ORAL | Status: DC

## 2016-04-05 NOTE — Telephone Encounter (Signed)
Script reprinted. I will only do this once

## 2016-04-05 NOTE — Telephone Encounter (Signed)
Pt called stating she would like Dr. Harrington Challenger office to call her pharmacy and give them Authorization for them to fill her Adderall. Per pt, when she was last in the the office, provider gived her 3 months worth of printed script and per pt, she took the slip to her pharmacy. Per pt, she knows she gived the sheet to the pharmacy and when she went to get her refills, they are telling her they don't have her script. Per pt, she do not have those other printed script and the pharmacy is saying they don't have the other printed scripts and she's been out of her medication for about a week and a half. Called pt pharmacy again and spoke with Nicki Reaper, they don't hold on to those type of medications anymore. Pt number is [phone].

## 2016-04-06 NOTE — Telephone Encounter (Signed)
Called pt and informed her printed script ready for pick up and pt verbalized understanding

## 2016-04-07 NOTE — Progress Notes (Signed)
-  NO SHOW-  ROS

## 2016-04-07 NOTE — Assessment & Plan Note (Deleted)
Normocytic, normochromic anemia with preservation of WBC and platelet count with lab findings suggestive of iron deficiency anemia and folate deficiency (on PO folate replacement).  No indication of chronic renal disease.  H/O of gastric bypass surgery and menorrhagia.  Labs today: CBC diff, anemia panel, retic count, LDH, ESR, CRP, path smear review, stool cards x 3.  Based upon her labs, we will calculate iron deficit and replace iron IV.  We then can try a trial of oral iron, particularly since she does not have chronic renal disease thereby limiting our ability to use ferric gluconate.  Based upon her March 2017 labs, her calculated iron deficit is ~ 700 mg.  Will update this calculation with labs today.  Labs in 6 weeks: CBC diff, iron/TIBC, ferritin.  Return in 6 weeks for follow-up

## 2016-04-08 ENCOUNTER — Ambulatory Visit (HOSPITAL_COMMUNITY): Payer: Self-pay | Admitting: Oncology

## 2016-04-12 ENCOUNTER — Encounter: Payer: Self-pay | Admitting: Family Medicine

## 2016-04-15 ENCOUNTER — Encounter: Payer: Self-pay | Admitting: Family Medicine

## 2016-04-21 ENCOUNTER — Other Ambulatory Visit: Payer: Self-pay | Admitting: Family Medicine

## 2016-04-21 ENCOUNTER — Encounter (HOSPITAL_COMMUNITY): Payer: Self-pay | Admitting: *Deleted

## 2016-04-21 ENCOUNTER — Encounter: Payer: Self-pay | Admitting: Family Medicine

## 2016-04-21 ENCOUNTER — Ambulatory Visit (INDEPENDENT_AMBULATORY_CARE_PROVIDER_SITE_OTHER): Payer: Medicaid Other | Admitting: Family Medicine

## 2016-04-21 VITALS — BP 110/72 | Ht 66.0 in | Wt 214.6 lb

## 2016-04-21 DIAGNOSIS — R29898 Other symptoms and signs involving the musculoskeletal system: Secondary | ICD-10-CM | POA: Diagnosis not present

## 2016-04-21 DIAGNOSIS — R202 Paresthesia of skin: Secondary | ICD-10-CM | POA: Diagnosis not present

## 2016-04-21 MED ORDER — HYDROCODONE-ACETAMINOPHEN 10-325 MG PO TABS: 1.0000 | ORAL_TABLET | Freq: Four times a day (QID) | ORAL | Status: DC | PRN

## 2016-04-21 NOTE — Progress Notes (Signed)
Pt came into office to pick up her printed script for her Adderall XR 30 mg. Pt came into office and stated she did not have her D/L with her. Pt did verify DOB and looked at script and stated that it was correct.

## 2016-04-21 NOTE — Progress Notes (Signed)
Spoke with patient and informed her that [place]- wanted her to turn the pain patches that did not work for her so we could dispose of them properly. Patient stated that she told them in to [place] yesterday.

## 2016-04-21 NOTE — Progress Notes (Signed)
   Subjective:    Patient ID: Penny [name], female    DOB: 1986/03/04, 30 y.o.   MRN: [id number]  HPI  Patient arrives to discuss pain management. The drug registry was checked. The patient overall states that she is been doing a fairly good job in the past with the pain medication taken the edge off her pain she tried multiple different pain medicines but all of them cause side effects including the last one which was a patch so she was prescribed Suboxone but she did not get that filled. The patient is very frustrated. She does not one go back to the previous pain management doctor because she felt that they were very impersonal and did not listen to her issues. The patient states all she is trying to do is get some relief to help her symptoms improve enough to where she can still function she denies abusing medication. Review of Systems  Constitutional: Negative for activity change and appetite change.  Gastrointestinal: Negative for vomiting and abdominal pain.  Neurological: Negative for weakness.  Psychiatric/Behavioral: Negative for confusion.       Objective:   Physical Exam  Constitutional: She appears well-nourished. No distress.  HENT:  Head: Normocephalic.  Cardiovascular: Normal rate, regular rhythm and normal heart sounds.   No murmur heard. Pulmonary/Chest: Effort normal and breath sounds normal.  Musculoskeletal: She exhibits no edema.  Lymphadenopathy:    She has no cervical adenopathy.  Neurological: She is alert.  Psychiatric: Her behavior is normal.  Vitals reviewed.  25 minutes spent with this patient discussing the importance of adherence to a pain management approach also that we will be doing urine drug screens and also having her sign a pain contract on future follow-up. She also understands that she is not to take any other medications other than what is prescribed by [name]       Assessment & Plan:  The patient was seen today as part of a comprehensive  visit regarding pain control. Patient's compliance with the medication as well as discussion regarding effectiveness was completed. Prescription was written. Patient was advised to follow-up in 1 months. The patient was assessed for any signs of severe side effects. The patient was advised to take the medicine as directed and to report to [name] if any side effect issues. She is to stop taking the other pain medicine she was using including the patch. She may start this medicine no more than 4 per day. She was given a 30 day supply. Her other prescription was canceled by [name] calling [employer]. The patient was going to call previous pain management doctor to let them know that she will not be following up. We will see the patient in approximately 3-4 weeks and issue another 30 day prescription at that time she is to be seen by [facility] pain management tomorrow for further evaluation

## 2016-04-22 DIAGNOSIS — Z0289 Encounter for other administrative examinations: Secondary | ICD-10-CM | POA: Insufficient documentation

## 2016-04-22 DIAGNOSIS — K289 Gastrojejunal ulcer, unspecified as acute or chronic, without hemorrhage or perforation: Secondary | ICD-10-CM | POA: Diagnosis present

## 2016-04-27 ENCOUNTER — Ambulatory Visit (HOSPITAL_COMMUNITY): Payer: Self-pay | Admitting: Oncology

## 2016-04-30 ENCOUNTER — Ambulatory Visit (HOSPITAL_COMMUNITY): Payer: Self-pay | Admitting: Hematology & Oncology

## 2016-05-06 ENCOUNTER — Ambulatory Visit (INDEPENDENT_AMBULATORY_CARE_PROVIDER_SITE_OTHER): Payer: Medicaid Other | Admitting: Psychiatry

## 2016-05-06 ENCOUNTER — Encounter (HOSPITAL_COMMUNITY): Payer: Self-pay | Admitting: Psychiatry

## 2016-05-06 VITALS — BP 105/73 | HR 86 | Ht 66.0 in | Wt 206.0 lb

## 2016-05-06 DIAGNOSIS — F332 Major depressive disorder, recurrent severe without psychotic features: Secondary | ICD-10-CM | POA: Diagnosis not present

## 2016-05-06 MED ORDER — ZOLPIDEM TARTRATE 10 MG PO TABS: 10.0000 mg | ORAL_TABLET | Freq: Every evening | ORAL | Status: DC | PRN

## 2016-05-06 MED ORDER — DULOXETINE HCL 60 MG PO CPEP: 60.0000 mg | ORAL_CAPSULE | Freq: Two times a day (BID) | ORAL | Status: DC

## 2016-05-06 MED ORDER — AMPHETAMINE-DEXTROAMPHET ER 30 MG PO CP24: 30.0000 mg | ORAL_CAPSULE | Freq: Every day | ORAL | Status: DC

## 2016-05-06 MED ORDER — CLONAZEPAM 0.5 MG PO TABS: 0.5000 mg | ORAL_TABLET | Freq: Three times a day (TID) | ORAL | Status: DC | PRN
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
## 2016-05-07 ENCOUNTER — Ambulatory Visit (HOSPITAL_COMMUNITY): Payer: Self-pay | Admitting: Hematology & Oncology

## 2016-05-07 ENCOUNTER — Telehealth: Payer: Self-pay | Admitting: Family Medicine

## 2016-05-07 NOTE — Telephone Encounter (Signed)
Pt was seen in [facility] per our referral out to them the end of May Was given a script for OT and was verbally told to have a fitting  For a AFO left leg and wrist braces for carpal tunnel L & R   Please advise what she is supposed to do from here? [place] because the script only says OT they won't do any of it and to give [name] a call   Notes should be in Epic from that visit

## 2016-05-10 NOTE — Telephone Encounter (Signed)
Left VM explaining that the patient needed to bring in the scripts  So I can send copy back to Dr Nicki Reaper

## 2016-05-10 NOTE — Telephone Encounter (Signed)
If patient can bring those scripts here to have Pamala Hurry copy them possibly we can redo the scripts and aren't again in order to get what is necessary covered-most recent note did not go into all the details a copy of the scripts would be helpful

## 2016-05-11 NOTE — Progress Notes (Signed)
This encounter was created in error - please disregard.

## 2016-05-13 ENCOUNTER — Ambulatory Visit (INDEPENDENT_AMBULATORY_CARE_PROVIDER_SITE_OTHER): Payer: Medicaid Other | Admitting: Family Medicine

## 2016-05-13 DIAGNOSIS — G609 Hereditary and idiopathic neuropathy, unspecified: Secondary | ICD-10-CM

## 2016-05-13 MED ORDER — HYDROCODONE-ACETAMINOPHEN 10-325 MG PO TABS: 1.0000 | ORAL_TABLET | Freq: Four times a day (QID) | ORAL | Status: DC | PRN

## 2016-05-13 NOTE — Progress Notes (Signed)
Patient ID: Penny [name], female   DOB: 1986-09-29, 29 y.o.   MRN: [id number] Patient was seen as a nurse visit by autumn Brown. The patient stated that her psychiatrist took her off pain medicine. She states she needs her pain medicine. Upon review of this issue the patient was told that we will not prescribe pain medicine with her being on a sleeping pill as well as nerve pills. The patient states that she will give up her sleeping medicine and nerve medicine in order to use of pain medicine. She was given a two-week supply on the pain medicine and schedule a follow-up for further discussion pain management contract urine drug screen etc. her nerve medication and sleeping medicine was canceled at the pharmacy as well as disposed up. She also got a prescription from [facility] for occupational therapy braces we wrote her some that she could get filled locally occupational therapy consultation was for her foot drop and weakness in her legs.

## 2016-05-17 ENCOUNTER — Encounter: Payer: Self-pay | Admitting: Family Medicine

## 2016-05-17 ENCOUNTER — Ambulatory Visit (INDEPENDENT_AMBULATORY_CARE_PROVIDER_SITE_OTHER): Payer: Medicaid Other | Admitting: Orthopedic Surgery

## 2016-05-17 DIAGNOSIS — R29898 Other symptoms and signs involving the musculoskeletal system: Secondary | ICD-10-CM

## 2016-05-17 NOTE — Progress Notes (Signed)
No show

## 2016-05-19 ENCOUNTER — Telehealth: Payer: Self-pay | Admitting: Family Medicine

## 2016-05-19 DIAGNOSIS — R29898 Other symptoms and signs involving the musculoskeletal system: Secondary | ICD-10-CM

## 2016-05-19 DIAGNOSIS — M21379 Foot drop, unspecified foot: Secondary | ICD-10-CM

## 2016-05-19 NOTE — Telephone Encounter (Signed)
Pt states since you removed the med Klonopin she has been having seizures  Again and would like to speak to you about this please. Pt was made aware that Dr Nicki Reaper is not in the office until next week. She is also 2 1/2 hours away from  Home right now.

## 2016-05-20 ENCOUNTER — Ambulatory Visit (HOSPITAL_COMMUNITY): Payer: Self-pay | Admitting: Psychiatry

## 2016-05-20 NOTE — Telephone Encounter (Signed)
Spoke with patient and informed her per Dr.Steve Luking- Dr.Scott to review medication concerns upon his arrival on Monday.Patient verbalized understanding. Message to be routed to [facility]

## 2016-05-20 NOTE — Telephone Encounter (Signed)
Pt wants to know what she is supposed to be doing about her seizure  Activity since Dr Nicki Reaper took her cold [place] off the Klonopin? She does  Not feel she should wait till he gets back in the office Monday for an  Answer.   Feels what she is taking for the seizures is not helping or going to help At all since he took this med away from her.  Please call pt

## 2016-05-20 NOTE — Telephone Encounter (Signed)
Dr Nicki Reaper please see message below about pt's med.

## 2016-05-20 NOTE — Telephone Encounter (Signed)
OT needs referral changed to PT if it is only for foot drop and weakness in legs. Please fax order to [phone] attn Izora Gala

## 2016-05-20 NOTE — Telephone Encounter (Signed)
Order changed to pt.

## 2016-05-20 NOTE — Telephone Encounter (Signed)
We can change the ot order, scott will need to address the med issue becuae complicated and sig mental component. nnurse to clarify pts medication concerns

## 2016-05-21 NOTE — Telephone Encounter (Signed)
[facility] 05/21/16

## 2016-05-21 NOTE — Telephone Encounter (Signed)
Discussed with pt. Pt verbalized understanding. Pt states she will follow up with neurology and psychiatry.

## 2016-05-21 NOTE — Telephone Encounter (Signed)
#  1- please see previous notes. This pt wanted her pain meds represcribed -she was informed that we would NOT prescribe pain meds with her being on nerve pills (and sleeping meds) this would be a possibly deadly combination. She then at that point CHOSE to stop the nerve meds and sleeping pills in order for me to write pain meds. She voluntarily turned in her meds to [facility]al[facility] Pear and the meds were disposed of and script for pain meds were given. She should discuss these seizures with neurology and psychiatry to see if there are other treatments besides benzodiazipines. ( It should also be noted this pt has an extensive history of pseudo seizures.) I am sympathetic to her situation but she can not be on both pain meds and nerve pills. I WILL NOT be presribing ongoing pain meds if pt is on Klonopins or any other nerve pill. Currently she may want to discuss with pyschiatry restarting her nerve pills and turning in her pain meds. She will have to decide what is more important. I will not be prescribing pain pills if pt also on nerve pills.

## 2016-05-24 ENCOUNTER — Telehealth: Payer: Self-pay | Admitting: Family Medicine

## 2016-05-24 ENCOUNTER — Telehealth (HOSPITAL_COMMUNITY): Payer: Self-pay | Admitting: *Deleted

## 2016-05-24 NOTE — Telephone Encounter (Signed)
Please advise what to do for pt's pain management referral Have been working on this for quite some time Pt was referred to Dr. Merlene Laughter for pain management by recommendation of Dr. Posey Pronto, pt was seen a couple times by Dr. Merlene Laughter - no showed her last 2 appt with him (currently has not been discharged from his practice however their office states that nothing that they have tried seems to have helped or pleased the pt)  Also tried to refer pt to [facility] for pain management as she had requested [facility], they declined to take her case due to they felt they had nothing new to offer her  Seen phone message & nurse visit note where pt states her psychiatrist took her off of pain meds  Please advise

## 2016-05-24 NOTE — Telephone Encounter (Signed)
Will cancel pain management referral at this time.  Is our understanding that the patient is being seen at [facility] and also may return to Dr. Merlene Laughter for continued pain management (due to not formally being discharged there from his care)

## 2016-05-24 NOTE — Telephone Encounter (Signed)
voice message from patient could not be understood. She said her doctor told her to call this office.   Returned phone call, left voice message.

## 2016-05-24 NOTE — Telephone Encounter (Signed)
This patient has challenging aspects to her care. It may well be impossible to find a pain management center in [place] or [place] who will be able to help her. Dr Merlene Laughter may well be able to help her. The difficult part is this patient often undercuts her own care and options. [facility] pain management is a reasonable choice. I do not have any other additional options to recommend. (On my previous note from June 9 I prescribed the patient pain medication but under the stipulation that she could not be on nerve medication. She then called back stating she was having more seizures which could well be pseudoseizures but nonetheless I recommended for this patient to talk with her psychiatrist and neurologist again and also reemphasize that we will not be prescribing pain medicine if she is on nerve medicine as well)

## 2016-05-25 ENCOUNTER — Telehealth (HOSPITAL_COMMUNITY): Payer: Self-pay | Admitting: *Deleted

## 2016-05-25 NOTE — Telephone Encounter (Signed)
Called pt and informed her of what provider stated. Pt sch appt to come into office sooner and verbalized understanding

## 2016-05-25 NOTE — Telephone Encounter (Signed)
Message sent to provider 

## 2016-05-25 NOTE — Telephone Encounter (Signed)
Too complicated to do on the phone, she will need to come in

## 2016-05-25 NOTE — Telephone Encounter (Signed)
Called pt due to previous phone call. Per pt, her PCP (Dr. Wolfgang Phoenix) told her to call Dr. Harrington Challenger office to inform her of the recent changes with her medications. Per pt, she's been having frequent seizure activities since she stopped taking the Klonopin and Ambien. Per pt, she did not wig off her Klonopin when she came off around 2 weeks ago. Per pt, her PCP told her she can not be on the Klonopin, Ambien and Hydrocodone at the same time and had to make a choice as to which medication she wanted to stay on. Per pt, she then stopped her Klonopin and Ambien and would like to know if Dr. Harrington Challenger could put her on any other medication that will not interfere with her Hydrocodone. Per pt, she have not slept for couple days now and having frequent seizures is wearing on her. Pt number is [phone].

## 2016-05-26 ENCOUNTER — Encounter (HOSPITAL_COMMUNITY): Payer: Self-pay | Admitting: Psychiatry

## 2016-05-26 ENCOUNTER — Ambulatory Visit (INDEPENDENT_AMBULATORY_CARE_PROVIDER_SITE_OTHER): Payer: Medicaid Other | Admitting: Psychiatry

## 2016-05-26 VITALS — BP 110/70 | HR 88 | Ht 66.0 in | Wt 208.0 lb

## 2016-05-26 DIAGNOSIS — F332 Major depressive disorder, recurrent severe without psychotic features: Secondary | ICD-10-CM | POA: Diagnosis not present

## 2016-05-26 MED ORDER — BUSPIRONE HCL 10 MG PO TABS: 10.0000 mg | ORAL_TABLET | Freq: Three times a day (TID) | ORAL | Status: DC
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
## 2016-05-28 ENCOUNTER — Ambulatory Visit (INDEPENDENT_AMBULATORY_CARE_PROVIDER_SITE_OTHER): Payer: Medicaid Other | Admitting: Family Medicine

## 2016-05-28 ENCOUNTER — Encounter: Payer: Self-pay | Admitting: Family Medicine

## 2016-05-28 VITALS — BP 122/78 | Ht 66.0 in | Wt 206.8 lb

## 2016-05-28 DIAGNOSIS — Z79891 Long term (current) use of opiate analgesic: Secondary | ICD-10-CM | POA: Diagnosis not present

## 2016-05-28 DIAGNOSIS — Z5189 Encounter for other specified aftercare: Secondary | ICD-10-CM

## 2016-05-28 DIAGNOSIS — R52 Pain, unspecified: Secondary | ICD-10-CM

## 2016-05-28 DIAGNOSIS — G609 Hereditary and idiopathic neuropathy, unspecified: Secondary | ICD-10-CM | POA: Diagnosis not present

## 2016-05-28 MED ORDER — CEPHALEXIN 500 MG PO CAPS: 500.0000 mg | ORAL_CAPSULE | Freq: Four times a day (QID) | ORAL | Status: DC

## 2016-05-28 MED ORDER — ALBUTEROL SULFATE HFA 108 (90 BASE) MCG/ACT IN AERS
2.0000 | INHALATION_SPRAY | Freq: Four times a day (QID) | RESPIRATORY_TRACT | Status: DC | PRN
Start: 2016-05-28 — End: 2017-06-14

## 2016-05-28 MED ORDER — HYDROCODONE-ACETAMINOPHEN 10-325 MG PO TABS: 1.0000 | ORAL_TABLET | Freq: Four times a day (QID) | ORAL | Status: DC | PRN

## 2016-05-28 NOTE — Progress Notes (Signed)
   Subjective:    Patient ID: Penny [name], female    DOB: Jul 29, 1986, 29 y.o.   MRN: [id number]  HPI  This patient was seen today for chronic pain  The medication list was reviewed and updated.   -Compliance with medication: yes  - Number patient states they take daily: 4  -when was the last dose patient took? yesterday  The patient was advised the importance of maintaining medication and not using illegal substances with these.  Refills needed: yes  The patient was educated that we can provide 3 monthly scripts for their medication, it is their responsibility to follow the instructions.  Side effects or complications from medications: none  Patient is aware that pain medications are meant to minimize the severity of the pain to allow their pain levels to improve to allow for better function. They are aware of that pain medications cannot totally remove their pain.  Due for UDT ( at least once per year) : next visit per dr       Review of Systems  Constitutional: Negative for activity change and appetite change.  Gastrointestinal: Negative for vomiting and abdominal pain.  Neurological: Negative for weakness.  Psychiatric/Behavioral: Negative for confusion.       Objective:   Physical Exam  Constitutional: She appears well-nourished. No distress.  HENT:  Head: Normocephalic.  Cardiovascular: Normal rate, regular rhythm and normal heart sounds.   No murmur heard. Pulmonary/Chest: Effort normal and breath sounds normal.  Musculoskeletal: She exhibits no edema.  Lymphadenopathy:    She has no cervical adenopathy.  Neurological: She is alert.  Psychiatric: Her behavior is normal.  Vitals reviewed.   Notes from [facility] reviewed      Assessment & Plan:  Patient relates painful neuropathy of the lower legs. Hydrocodone does help. She is not taking any benzodiazepines. Patient unable to urinate today. States she has been using her medicine as directed. I am went  ahead and gave her prescription for 120 tablets one 4 times a day. I did check the drug registry she is not getting medication from any other provider. She will follow-up in approximately 3 weeks for a pill count and urine drug screen.

## 2016-05-31 ENCOUNTER — Ambulatory Visit (HOSPITAL_COMMUNITY): Payer: Medicaid Other | Attending: Family Medicine

## 2016-05-31 ENCOUNTER — Ambulatory Visit (HOSPITAL_COMMUNITY): Payer: Self-pay | Admitting: Physical Therapy

## 2016-05-31 ENCOUNTER — Telehealth: Payer: Self-pay | Admitting: Family Medicine

## 2016-05-31 ENCOUNTER — Telehealth (HOSPITAL_COMMUNITY): Payer: Self-pay | Admitting: *Deleted

## 2016-05-31 MED ORDER — DOXYCYCLINE HYCLATE 100 MG PO TABS: ORAL_TABLET | ORAL | Status: DC

## 2016-05-31 MED ORDER — PREDNISONE 20 MG PO TABS: ORAL_TABLET | ORAL | Status: DC

## 2016-05-31 NOTE — Telephone Encounter (Signed)
Pt called stating that she is not gotten any better and the chest pressure has gotten worse. Please advise.

## 2016-05-31 NOTE — Telephone Encounter (Signed)
Cough congestion and feeling terrible-chest tightness and pressure is worse. Patient on Keflex and albuterol inhaler

## 2016-05-31 NOTE — Telephone Encounter (Signed)
Patient would also like to know if she can have a refill on Zanaflex as well as prescription for stronger dose of Diflucan for yeast infection.

## 2016-05-31 NOTE — Telephone Encounter (Signed)
Spoke with patient and informed her per Dr.Scott Luking-Prednisone 20mg  2 daily for the next 3 days, then 1 tablet a day for 3 days. Change antibiotic to doxycycline 100 mg 1 tablet twice a day for 7 days with snack, if ongoing trouble follow up. Patient verbalized understanding. Patient also has concerns of vaginal itching with yellow discharge. Patient would like to know if we can send in a stronger dose of Diflucan?

## 2016-05-31 NOTE — Telephone Encounter (Signed)
phone call from patient regarding Belsomra.  It is not keeping her alseep all night.   She would like the 20 mg tablet.

## 2016-05-31 NOTE — Telephone Encounter (Signed)
Prednisone 20 mg 2 daily for the next 3 days then 1 a day for 3 days, change antibiotics to doxycycline 100 mg twice a day for 7 days with snack, if ongoing trouble follow-up

## 2016-06-01 MED ORDER — TIZANIDINE HCL 4 MG PO TABS: ORAL_TABLET | ORAL | Status: DC

## 2016-06-01 MED ORDER — FLUCONAZOLE 200 MG PO TABS: ORAL_TABLET | ORAL | Status: DC

## 2016-06-01 NOTE — Telephone Encounter (Signed)
Please call in 30 day supply, 2 refills

## 2016-06-01 NOTE — Telephone Encounter (Signed)
Spoke with patient and informed her per Dr.Scott Luking- we are sending in Diflucan 200 mg 1 daily for 7 days, Zanaflex 4 mg, #30, one 3 times a day when necessary, home use only, caution drowsiness do not drive with medication. Patient verbalized understanding. Medication sent into pharmacy.

## 2016-06-01 NOTE — Telephone Encounter (Signed)
Diflucan 200 mg 1 daily for 7 days, Zanaflex 4 mg, #30, one 3 times a day when necessary, home use only, caution drowsiness do not drive with medication, 1 refill

## 2016-06-01 NOTE — Addendum Note (Signed)
Addended by: Ofilia Neas R on: 06/01/2016 02:50 PM   Modules accepted: Orders

## 2016-06-02 ENCOUNTER — Telehealth: Payer: Self-pay | Admitting: Family Medicine

## 2016-06-02 NOTE — Telephone Encounter (Signed)
Pt called stating that the cough has gotten worse and is wanting something called in.      [employer]

## 2016-06-02 NOTE — Telephone Encounter (Signed)
It is very difficult to treat these symptoms over the phone if a person is complaining of shortness of breath they should go ahead and be checked out. Please get her an appointment for this week this can be with Hoyle Sauer or myself. Patient may need chest x-ray may need O2 sat etc. may need lab work as well

## 2016-06-02 NOTE — Telephone Encounter (Signed)
Spoke with patient and patient stated that cough is worsening. Patient is currently still taking prednisone, doxycycline, and using Albuterol inhaler. Patient states that cough is non productive but more frequently. Has some c/o shortness of breath. Please advise?

## 2016-06-03 NOTE — Telephone Encounter (Signed)
[facility]

## 2016-06-03 NOTE — Telephone Encounter (Signed)
Notified patient it is very difficult to treat these symptoms over the phone if a person is complaining of shortness of breath they should go ahead and be checked out. Please get an appointment for this week this can be with Hoyle Sauer or Dr. Nicki Reaper. Patient may need chest x-ray may need O2 sat etc. may need lab work as well. Patient transferred to front desk to schedule appointment.

## 2016-06-04 ENCOUNTER — Ambulatory Visit: Payer: Medicaid Other | Admitting: Family Medicine

## 2016-06-09 ENCOUNTER — Ambulatory Visit (HOSPITAL_COMMUNITY): Payer: Self-pay | Admitting: Psychiatry

## 2016-06-09 ENCOUNTER — Encounter: Payer: Self-pay | Admitting: Family Medicine

## 2016-06-09 ENCOUNTER — Ambulatory Visit (INDEPENDENT_AMBULATORY_CARE_PROVIDER_SITE_OTHER): Payer: Medicaid Other | Admitting: Family Medicine

## 2016-06-09 VITALS — BP 122/80 | Temp 97.3°F | Ht 66.0 in | Wt 206.1 lb

## 2016-06-09 DIAGNOSIS — K222 Esophageal obstruction: Secondary | ICD-10-CM

## 2016-06-09 DIAGNOSIS — R1013 Epigastric pain: Secondary | ICD-10-CM | POA: Diagnosis not present

## 2016-06-09 MED ORDER — PROMETHAZINE HCL 25 MG PO TABS: ORAL_TABLET | ORAL | Status: DC

## 2016-06-09 NOTE — Progress Notes (Signed)
   Subjective:    Patient ID: Penny [name], female    DOB: [birth year], 30 y.o.   MRN: [id number]  Emesis  This is a new problem. The current episode started in the past 7 days. Associated symptoms include abdominal pain, chest pain, coughing, diarrhea, headaches and sweats. Pertinent negatives include no fever. Associated symptoms comments: Runny nose. Treatments tried: Phenergan, Pepto.   Patient has concerns of insect bite to right upper leg. Patient states that symptoms started after being in the woods.  Patient denies high fevers chills sweat relates nausea and vomiting Review of Systems  Constitutional: Positive for fatigue. Negative for fever.  Respiratory: Positive for cough.   Cardiovascular: Positive for chest pain.  Gastrointestinal: Positive for vomiting, abdominal pain and diarrhea.  Neurological: Positive for headaches.       Objective:   Physical Exam Lungs clear heart regular abdomen soft no guarding rebound or tenderness. Has a bug bite on the right upper leg does not appear to be a tick bite a little bit of erythema but no tenderness does have some thickness to the area       Assessment & Plan:  Significant nausea with epigastric pain patient has history of gastric bypass also history of esophageal stricture she is seen local gastroenterologist for this for EGD.Dr Gala Romney  Will use Phenergan for nausea caution drowsiness bland diet, recommend lab work await the results.  Bug bite noted but I doubt tick bite if high fevers difficulty or progression of symptoms follow-up sooner

## 2016-06-10 ENCOUNTER — Other Ambulatory Visit: Payer: Self-pay

## 2016-06-10 ENCOUNTER — Ambulatory Visit: Payer: Medicaid Other | Admitting: Family Medicine

## 2016-06-10 LAB — HEPATIC FUNCTION PANEL
ALT: 22 IU/L (ref 0–32)
AST: 19 IU/L (ref 0–40)
Albumin: 4.7 g/dL (ref 3.5–5.5)
Alkaline Phosphatase: 90 IU/L (ref 39–117)
BILIRUBIN, DIRECT: 0.07 mg/dL (ref 0.00–0.40)
Bilirubin Total: 0.3 mg/dL (ref 0.0–1.2)
TOTAL PROTEIN: 7.5 g/dL (ref 6.0–8.5)

## 2016-06-10 LAB — CBC WITH DIFFERENTIAL/PLATELET
BASOS ABS: 0 10*3/uL (ref 0.0–0.2)
Basos: 0 %
EOS (ABSOLUTE): 0.2 10*3/uL (ref 0.0–0.4)
EOS: 2 %
HEMOGLOBIN: 11.6 g/dL (ref 11.1–15.9)
Hematocrit: 36.1 % (ref 34.0–46.6)
IMMATURE GRANULOCYTES: 0 %
Immature Grans (Abs): 0 10*3/uL (ref 0.0–0.1)
LYMPHS ABS: 4.2 10*3/uL — AB (ref 0.7–3.1)
Lymphs: 47 %
MCH: 27.5 pg (ref 26.6–33.0)
MCHC: 32.1 g/dL (ref 31.5–35.7)
MCV: 86 fL (ref 79–97)
MONOCYTES: 9 %
Monocytes Absolute: 0.8 10*3/uL (ref 0.1–0.9)
NEUTROS PCT: 42 %
Neutrophils Absolute: 3.7 10*3/uL (ref 1.4–7.0)
Platelets: 482 10*3/uL — ABNORMAL HIGH (ref 150–379)
RBC: 4.22 x10E6/uL (ref 3.77–5.28)
RDW: 14.8 % (ref 12.3–15.4)
WBC: 8.8 10*3/uL (ref 3.4–10.8)

## 2016-06-10 LAB — BASIC METABOLIC PANEL
BUN / CREAT RATIO: 20 (ref 9–23)
BUN: 14 mg/dL (ref 6–20)
CALCIUM: 10 mg/dL (ref 8.7–10.2)
CHLORIDE: 99 mmol/L (ref 96–106)
CO2: 21 mmol/L (ref 18–29)
CREATININE: 0.71 mg/dL (ref 0.57–1.00)
GFR calc Af Amer: 134 mL/min/{1.73_m2} (ref >59)
GFR calc non Af Amer: 116 mL/min/{1.73_m2} (ref >59)
GLUCOSE: 93 mg/dL (ref 65–99)
Potassium: 4.7 mmol/L (ref 3.5–5.2)
Sodium: 139 mmol/L (ref 134–144)

## 2016-06-10 LAB — LIPASE: LIPASE: 34 U/L (ref 0–59)

## 2016-06-10 MED ORDER — PANTOPRAZOLE SODIUM 40 MG PO TBEC: 40.0000 mg | DELAYED_RELEASE_TABLET | Freq: Every day | ORAL | Status: DC

## 2016-06-14 ENCOUNTER — Other Ambulatory Visit (HOSPITAL_COMMUNITY): Payer: Self-pay | Admitting: *Deleted

## 2016-06-14 NOTE — Telephone Encounter (Signed)
Phone call from patient, she needs her Belsomra.   Said she has not heard from previous phone call.

## 2016-06-15 ENCOUNTER — Emergency Department (HOSPITAL_COMMUNITY)
Admission: EM | Admit: 2016-06-15 | Discharge: 2016-06-15 | Disposition: A | Discharge status: Home / Self Care | Payer: Medicaid Other | Attending: Emergency Medicine | Admitting: Emergency Medicine

## 2016-06-15 ENCOUNTER — Telehealth: Payer: Self-pay | Admitting: Family Medicine

## 2016-06-15 ENCOUNTER — Encounter (HOSPITAL_COMMUNITY): Payer: Self-pay | Admitting: Emergency Medicine

## 2016-06-15 ENCOUNTER — Emergency Department (HOSPITAL_COMMUNITY): Payer: Medicaid Other

## 2016-06-15 DIAGNOSIS — R52 Pain, unspecified: Secondary | ICD-10-CM

## 2016-06-15 DIAGNOSIS — Z87891 Personal history of nicotine dependence: Secondary | ICD-10-CM | POA: Insufficient documentation

## 2016-06-15 DIAGNOSIS — R109 Unspecified abdominal pain: Secondary | ICD-10-CM

## 2016-06-15 DIAGNOSIS — Z79899 Other long term (current) drug therapy: Secondary | ICD-10-CM | POA: Diagnosis not present

## 2016-06-15 DIAGNOSIS — F329 Major depressive disorder, single episode, unspecified: Secondary | ICD-10-CM | POA: Insufficient documentation

## 2016-06-15 LAB — COMPREHENSIVE METABOLIC PANEL
ALK PHOS: 73 U/L (ref 38–126)
ALT: 60 U/L — AB (ref 14–54)
AST: 67 U/L — ABNORMAL HIGH (ref 15–41)
Albumin: 4.2 g/dL (ref 3.5–5.0)
Anion gap: 3 — ABNORMAL LOW (ref 5–15)
BILIRUBIN TOTAL: 0.4 mg/dL (ref 0.3–1.2)
BUN: 11 mg/dL (ref 6–20)
CALCIUM: 8.7 mg/dL — AB (ref 8.9–10.3)
CO2: 24 mmol/L (ref 22–32)
CREATININE: 0.54 mg/dL (ref 0.44–1.00)
Chloride: 110 mmol/L (ref 101–111)
Glucose, Bld: 99 mg/dL (ref 65–99)
Potassium: 4.2 mmol/L (ref 3.5–5.1)
Sodium: 137 mmol/L (ref 135–145)
TOTAL PROTEIN: 7.2 g/dL (ref 6.5–8.1)

## 2016-06-15 LAB — CBC WITH DIFFERENTIAL/PLATELET

## 2016-06-15 LAB — CBC
HCT: 34.1 % — ABNORMAL LOW (ref 36.0–46.0)
Hemoglobin: 10.8 g/dL — ABNORMAL LOW (ref 12.0–15.0)
MCH: 27.8 pg (ref 26.0–34.0)
MCHC: 31.7 g/dL (ref 30.0–36.0)
MCV: 87.7 fL (ref 78.0–100.0)
PLATELETS: 326 10*3/uL (ref 150–400)
RBC: 3.89 MIL/uL (ref 3.87–5.11)
RDW: 14 % (ref 11.5–15.5)
WBC: 4.5 10*3/uL (ref 4.0–10.5)

## 2016-06-15 LAB — URINE MICROSCOPIC-ADD ON

## 2016-06-15 LAB — I-STAT BETA HCG BLOOD, ED (MC, WL, AP ONLY): I-stat hCG, quantitative: <5 m[IU]/mL (ref <5)

## 2016-06-15 LAB — DIFFERENTIAL
Basophils Absolute: 0 10*3/uL (ref 0.0–0.1)
Basophils Relative: 0 %
Eosinophils Absolute: 0.1 10*3/uL (ref 0.0–0.7)
Eosinophils Relative: 2 %
Lymphocytes Relative: 44 %
Lymphs Abs: 2 10*3/uL (ref 0.7–4.0)
Monocytes Absolute: 0.5 10*3/uL (ref 0.1–1.0)
Monocytes Relative: 11 %
Neutro Abs: 1.9 10*3/uL (ref 1.7–7.7)
Neutrophils Relative %: 43 %

## 2016-06-15 LAB — URINALYSIS, ROUTINE W REFLEX MICROSCOPIC
Bilirubin Urine: NEGATIVE
Glucose, UA: NEGATIVE mg/dL
Ketones, ur: NEGATIVE mg/dL
Leukocytes, UA: NEGATIVE
Nitrite: NEGATIVE
Protein, ur: NEGATIVE mg/dL
Specific Gravity, Urine: 1.015 (ref 1.005–1.030)
pH: 7 (ref 5.0–8.0)

## 2016-06-15 LAB — LACTIC ACID, PLASMA: Lactic Acid, Venous: 1 mmol/L (ref 0.5–1.9)

## 2016-06-15 LAB — LIPASE, BLOOD: Lipase: 17 U/L (ref 11–51)

## 2016-06-15 MED ORDER — HYDROMORPHONE HCL 1 MG/ML IJ SOLN
1.0000 mg | Freq: Once | INTRAMUSCULAR | Status: AC
  Administered 2016-06-15: 1 mg via INTRAVENOUS
  Filled 2016-06-15: qty 1

## 2016-06-15 MED ORDER — SODIUM CHLORIDE 0.9 % IV BOLUS (SEPSIS)
1000.0000 mL | Freq: Once | INTRAVENOUS | Status: AC
  Administered 2016-06-15: 1000 mL via INTRAVENOUS

## 2016-06-15 MED ORDER — PROCHLORPERAZINE EDISYLATE 5 MG/ML IJ SOLN
10.0000 mg | Freq: Once | INTRAMUSCULAR | Status: AC
  Administered 2016-06-15: 10 mg via INTRAVENOUS
  Filled 2016-06-15: qty 2

## 2016-06-15 MED ORDER — HYDROCODONE-ACETAMINOPHEN 5-325 MG PO TABS
1.0000 | ORAL_TABLET | Freq: Once | ORAL | Status: AC
  Administered 2016-06-15: 1 via ORAL
  Filled 2016-06-15: qty 1

## 2016-06-15 MED ORDER — SODIUM CHLORIDE 0.9 % IV SOLN
1000.0000 mL | Freq: Once | INTRAVENOUS | Status: AC
  Administered 2016-06-15: 1000 mL via INTRAVENOUS

## 2016-06-15 MED ORDER — PROMETHAZINE HCL 25 MG RE SUPP: 25.0000 mg | Freq: Four times a day (QID) | RECTAL | Status: DC | PRN

## 2016-06-15 NOTE — ED Provider Notes (Signed)
CSN: [id number]     Arrival date & time 06/15/16  1546 History   First MD Initiated Contact with Patient 06/15/16 1554     Chief Complaint  Patient presents with  . Flank Pain     (Consider location/radiation/quality/duration/timing/severity/associated sxs/prior Treatment) Patient is a 30 y.o. female presenting with flank pain. The history is provided by the patient (Patient complains of left flank pain and vomiting).  Flank Pain This is a new problem. The current episode started 6 to 12 hours ago. The problem occurs constantly. The problem has not changed since onset.Pertinent negatives include no chest pain, no abdominal pain, no headaches and no shortness of breath. Nothing aggravates the symptoms. Nothing relieves the symptoms.    Past Medical History  Diagnosis Date  . Migraines   . Interstitial cystitis   . IUD FEB 2010  . Gastritis JULY 2011  . Depression   . 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 Fatty liver   . Obesity (BMI 30-39.9) 2011 228 LBS BMI 36.8  . Iron deficiency anemia 07/23/2010  . Irritable bowel syndrome 2012 DIARRHEA    JUN 2012 TTG IgA 14.9  . Elevated liver enzymes JUL 2011ALK PHOS 111-127 AST  143-267 ALT  213-321T BILI 0.6  ALB  3.7-4.06 Jun 2011 ALK PHOS 118 AST 24 ALT 42 T BILI 0.4 ALB 3.9  . Chronic daily headache   . Ovarian cyst   . ADD (attention deficit disorder)   . RLQ abdominal pain 07/18/2013  . Menorrhagia 07/18/2013  . Anxiety   . Potassium (K) deficiency   . Dysmenorrhea 09/18/2013  . Stress 09/18/2013  . Patient desires pregnancy 09/18/2013  . Pregnant 12/25/2013  . Blood transfusion without reported diagnosis   . Heart murmur   . Gastric bypass status for obesity   . 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 Dysrhythmia   . Sciatica of left side 11/14/2014  . Fibromyalgia   . 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 PONV (postoperative nausea and vomiting) seizure post-operatively  . Psychiatric  pseudoseizure   . Depression   . Lupus ([place])   . Family history of adverse reaction to anesthesia     'dad had to be kept on pump for breathing for morphine'  . History of Holter monitoring   . Anginal pain ([place])   . Hx of opioid abuse     for about 4 years, about 4 years ago  . Encounter for drug rehabilitation     at behavioral health for opioid addiction about 4 years ago   Past Surgical History  Procedure Laterality Date  . Gab  2007    in [place]-POUCH 5 CM  . Cholecystectomy  2005    biliary dyskinesia  . Cath self every nite      for sodium bicarb injection (discontinued 2013)  . Colonoscopy  JUN 2012 ABD PN/DIARRHEA WITH PROPOFOL    NL COLON  . Upper gastrointestinal endoscopy  JULY 2011 NAUSEA-D125,V6, PH 25    Bx; GASTRITIS, POUCH-5 CM LONG  . Dilation and curettage of uterus    . Tonsillectomy    . Gastric bypass  06/2006  . Savory dilation  06/20/2012    Dr. Barnie Alderman gastritis/Ulcer in the mid jejunum. Empiric dilation.   . Tonsillectomy and adenoidectomy    . Esophagogastroduodenoscopy    . Wisdom tooth extraction    . Repair vaginal cuff N/A 07/30/2014    Procedure:  REPAIR VAGINAL CUFF;  Surgeon: Mora Bellman, MD;  Location: [facility];  Service: Gynecology;  Laterality: N/A;  . Hysteroscopy w/d&c N/A 09/12/2014    Procedure: DILATATION AND CURETTAGE /HYSTEROSCOPY;  Surgeon: Jonnie Kind, MD;  Location: [facility];  Service: Gynecology;  Laterality: N/A;  . Esophagogastroduodenoscopy (egd) with propofol N/A 04/10/2015    Procedure: ESOPHAGOGASTRODUODENOSCOPY (EGD) WITH PROPOFOL;  Surgeon: Daneil Dolin, MD;  Location: [facility];  Service: Endoscopy;  Laterality: N/A;  . Esophageal dilation N/A 04/10/2015    Procedure: ESOPHAGEAL DILATION WITH 54FR MALONEY DILATOR;  Surgeon: Daneil Dolin, MD;  Location: [facility];  Service: Endoscopy;  Laterality: N/A;  . Biopsy  04/10/2015    Procedure: BIOPSY;  Surgeon: Daneil Dolin, MD;  Location: [facility];  Service: Endoscopy;;  .  Sural nerve bx Left 02/25/2016    Procedure: LEFT SURAL NERVE BIOPSY;  Surgeon: Jovita Gamma, MD;  Location: [facility];  Service: Neurosurgery;  Laterality: Left;  Left sural nerve biopsy   Family History  Problem Relation Age of Onset  . Hemochromatosis Maternal Grandmother   . Migraines Maternal Grandmother   . Cancer Maternal Grandmother   . Breast cancer Maternal Grandmother   . Hypertension Father   . Diabetes Father   . Coronary artery disease Father   . Migraines Paternal Grandmother   . Breast cancer Paternal Grandmother   . Cancer Mother     breast  . Hemochromatosis Mother   . Breast cancer Mother   . Depression Mother   . Anxiety disorder Mother   . Coronary artery disease Paternal Grandfather   . Anxiety disorder Brother   . Bipolar disorder Brother   . Healthy Son   . Healthy Daughter    Social History  Substance Use Topics  . Smoking status: Former Smoker -- 0.25 packs/day for 6 years    Types: Cigarettes    Quit date: 11/29/2015  . Smokeless tobacco: Never Used  . Alcohol Use: 0.0 oz/week    0 Standard drinks or equivalent per week     Comment: once every weeks - maybe a beer with a dinner, rarely   OB History    Gravida Para Term Preterm AB TAB SAB Ectopic Multiple Living   '3 2 1 1 1  1   2     '$ Review of Systems  Constitutional: Negative for appetite change and fatigue.  HENT: Negative for congestion, ear discharge and sinus pressure.   Eyes: Negative for discharge.  Respiratory: Negative for cough and shortness of breath.   Cardiovascular: Negative for chest pain.  Gastrointestinal: Negative for abdominal pain and diarrhea.  Genitourinary: Positive for flank pain. Negative for frequency and hematuria.  Musculoskeletal: Negative for back pain.  Skin: Negative for rash.  Neurological: Negative for seizures and headaches.  Psychiatric/Behavioral: Negative for hallucinations.      Allergies  Gabapentin; Metoclopramide hcl; Tramadol; Morphine  and related; Nucynta; Propofol; Trazodone and nefazodone; Zofran; Butrans; Latex; Lyrica; and Tape  Home Medications   Prior to Admission medications   Medication Sig Start Date End Date Taking? Authorizing Provider  albuterol (PROVENTIL HFA;VENTOLIN HFA) 108 (90 Base) MCG/ACT inhaler Inhale 2 puffs into the lungs every 6 (six) hours as needed for wheezing. Patient not taking: Reported on 06/09/2016 05/28/16   Kathyrn Drown, MD  amphetamine-dextroamphetamine (ADDERALL XR) 30 MG 24 hr capsule Take 1 capsule (30 mg total) by mouth daily. Patient not taking: Reported on 06/09/2016 05/06/16   Cloria Spring, MD  APAP-Pamabrom-Pyrilamine (MENSTRUAL PAIN RELIEF) 500-25-15 MG TABS Take 1-2 tablets by mouth every 30 (thirty) days. Reported on 06/09/2016    Historical Provider, MD  busPIRone (BUSPAR) 10 MG tablet Take 1 tablet (10 mg total) by mouth 3 (three) times daily. Patient not taking: Reported on 06/09/2016 05/26/16   Cloria Spring, MD  carbamazepine (TEGRETOL) 200 MG tablet Take two tablets at bedtime Patient not taking: Reported on 06/09/2016 03/01/16   Cloria Spring, MD  DULoxetine (CYMBALTA) 60 MG capsule Take 1 capsule (60 mg total) by mouth 2 (two) times daily. Patient not taking: Reported on 06/09/2016 05/06/16   Cloria Spring, MD  fluconazole (DIFLUCAN) 200 MG tablet Take 1 tablet by mouth for 7 days Patient not taking: Reported on 06/09/2016 06/01/16   Kathyrn Drown, MD  folic acid (FOLVITE) 710 MCG tablet Take 800 mcg by mouth at bedtime. Reported on 06/09/2016    Historical Provider, MD  HYDROcodone-acetaminophen (NORCO) 10-325 MG tablet Take 1 tablet by mouth every 6 (six) hours as needed. 05/28/16   Kathyrn Drown, MD  pantoprazole (PROTONIX) 40 MG tablet Take 1 tablet (40 mg total) by mouth daily. 06/10/16   Kathyrn Drown, MD  phenazopyridine (PYRIDIUM) 100 MG tablet Take 1 tablet (100 mg total) by mouth 3 (three) times daily as needed for pain. Patient not taking: Reported on 06/09/2016 02/26/16   Kathyrn Drown, MD  predniSONE (DELTASONE) 20 MG tablet Take 2 tablets by mouth for 3 days, then take 1 tablet by mouth for 3 days Patient not taking: Reported on 06/09/2016 05/31/16   Kathyrn Drown, MD  promethazine (PHENERGAN) 25 MG suppository Place 1 suppository (25 mg total) rectally every 6 (six) hours as needed for nausea or vomiting. 06/15/16   Milton Ferguson, MD  Suvorexant (BELSOMRA) 15 MG TABS Take 15 mg by mouth daily.    Historical Provider, MD  tiZANidine (ZANAFLEX) 4 MG tablet Take 1 tablet by mouth 3 times a day as needed. For home use only. Caution drowsiness. Do not drive on medication Patient not taking: Reported on 06/09/2016 06/01/16   Kathyrn Drown, MD  topiramate (TOPAMAX) 50 MG tablet TAKE 1 TABLET BY MOUTH EACH MORNING AND 2 TABLETS IN THE EVENING AS NEEDED FOR MIGRAINES. Patient not taking: Reported on 06/09/2016 03/09/16   Kathyrn Drown, MD   BP 122/76 mmHg  Pulse 59  Temp(Src) 97.7 F (36.5 C) (Oral)  Resp 16  Ht '5\' 6"'$  (1.676 m)  Wt 208 lb (94.348 kg)  BMI 33.59 kg/m2  SpO2 99%  LMP 05/16/2016 Physical Exam  Constitutional: She is oriented to person, place, and time. She appears well-developed.  HENT:  Head: Normocephalic.  Eyes: Conjunctivae and EOM are normal. No scleral icterus.  Neck: Neck supple. No thyromegaly present.  Cardiovascular: Normal rate and regular rhythm.  Exam reveals no gallop and no friction rub.   No murmur heard. Pulmonary/Chest: No stridor. She has no wheezes. She has no rales. She exhibits no tenderness.  Abdominal: She exhibits no distension. There is no tenderness. There is no rebound.  Genitourinary:  Tender right flank  Musculoskeletal: Normal range of motion. She exhibits no edema.  Lymphadenopathy:    She has no cervical adenopathy.  Neurological: She is oriented to person, place, and time. She exhibits normal muscle tone. Coordination normal.  Skin: No rash noted. No erythema.  Psychiatric: She has a normal mood and affect. Her  behavior is normal.    ED Course  Procedures (including critical care time) Labs Review Labs Reviewed  COMPREHENSIVE METABOLIC PANEL - Abnormal; Notable for the following:    Calcium 8.7 (*)    AST 67 (*)    ALT 60 (*)    Anion gap 3 (*)    All other components within normal limits  CBC - Abnormal; Notable for the following:    Hemoglobin 10.8 (*)    HCT 34.1 (*)    All other components within normal limits  URINALYSIS, ROUTINE W REFLEX MICROSCOPIC (NOT AT [facility]) - Abnormal; Notable for the following:    Hgb urine dipstick SMALL (*)    All other components within normal limits  URINE MICROSCOPIC-ADD ON - Abnormal; Notable for the following:    Squamous Epithelial / LPF 6-30 (*)    Bacteria, UA FEW (*)    All other components within normal limits  LIPASE, BLOOD  CBC WITH DIFFERENTIAL/PLATELET  LACTIC ACID, PLASMA  DIFFERENTIAL  I-STAT BETA HCG BLOOD, ED ([facility], [facility], [facility] ONLY)    Imaging Review Ct Renal Stone Study  06/15/2016  CLINICAL DATA:  Pain, unable to urinate for 1 week EXAM: CT ABDOMEN AND PELVIS WITHOUT CONTRAST TECHNIQUE: Multidetector CT imaging of the abdomen and pelvis was performed following the standard protocol without IV contrast. COMPARISON:  06/11/2015 FINDINGS: Lower chest:  The lung bases are unremarkable. Hepatobiliary: Unenhanced liver shows no biliary ductal dilatation. The patient is status postcholecystectomy. Pancreas: Unenhanced pancreas is unremarkable. Spleen: Unenhanced spleen is unremarkable. Adrenals/Urinary Tract: No adrenal gland mass. Unenhanced kidneys are symmetrical in size. No hydronephrosis or hydroureter. No calcified ureteral calculi. No nephrolithiasis. Moderate distended urinary bladder. No calcified calculi are noted within urinary bladder. Stomach/Bowel: The patient is status post gastric bypass surgery. No small bowel obstruction. Terminal ileum is unremarkable. There is no pericecal inflammation. Normal appendix partially visualized in  coronal image 55. Moderate stool noted within descending colon. Moderate stool noted within sigmoid colon. No distal colonic obstruction. Vascular/Lymphatic: No retroperitoneal or mesenteric adenopathy. No aortic aneurysm. Reproductive: The unenhanced uterus is anteflexed.  No adnexal mass. Other: No ascites or free air.  No inguinal adenopathy. Musculoskeletal: No destructive bony lesions are noted. No acute fractures. IMPRESSION: 1. There is no evidence of nephrolithiasis. No hydronephrosis or hydroureter. 2. No calcified ureteral calculi. 3. Status post gastric bypass surgery. 4. Moderate distended urinary bladder. No evidence of calcified calculi within urinary bladder. 5. Status postcholecystectomy. 6. No pericecal inflammation.  Normal appendix partially visualized. 7. Moderate colonic stool noted within distal colon. No evidence of colonic obstruction. 8. Electronically Signed   By: Lahoma Crocker M.D.   On: 06/15/2016 18:53   I have personally reviewed and evaluated these images and lab results as part of my medical decision-making.   EKG Interpretation None      MDM   Final diagnoses:  Pain  Flank pain    Patient with vomiting and right flank discomfort labs unremarkable urinalysis normal CT scan abdomen negative. Patient improved with nausea and pain medicine she will be sent home with Phenergan will follow-up her PCP    Milton Ferguson, MD 06/15/16 2132

## 2016-06-15 NOTE — ED Notes (Signed)
Pt reports RT sided flank pain that radiates to back. Also c/o n/v and urinary retentions. States she has not urinated since 1500 yesterday.

## 2016-06-15 NOTE — Addendum Note (Signed)
Addended by: Rosana Hoes T on: 06/15/2016 05:07 PM   Modules accepted: Orders, Medications

## 2016-06-15 NOTE — Discharge Instructions (Signed)
Follow up with your family md  °

## 2016-06-15 NOTE — Telephone Encounter (Signed)
Return call to pt. Pt express concerns about medication refill for Belsomra 15mg . Pt states the refill was not called into pharmacy. Pt states she is unable to sleep. Per Dr. Alan Ripper note on 6/27, refill for Belsomra 15mg , #30 is authorized to be phoned into pharmacy. Spoke to [place] at [employer] [phone], gave verbal order for refill on Belsomra. Per Ovid Curd, pharmacy will notify pt once rx is ready pickup. Pt is schedule for a f/u appt on 7/18. Called and informed pt of rx status. Pt verbalizes understanding.

## 2016-06-15 NOTE — Telephone Encounter (Signed)
Pt having vomiting, nausea, and abd pain, sweating, not eating, no urination today. Pt advised to go to ED. Pt states she will go to [facility] ED today.

## 2016-06-15 NOTE — Telephone Encounter (Signed)
Pt called stating that she is having the same symptoms as the last time she was in to see the dr. Please advise.

## 2016-06-16 ENCOUNTER — Ambulatory Visit (HOSPITAL_COMMUNITY): Payer: Medicaid Other | Admitting: Psychiatry

## 2016-06-21 ENCOUNTER — Telehealth (HOSPITAL_COMMUNITY): Payer: Self-pay | Admitting: *Deleted

## 2016-06-21 MED ORDER — SUVOREXANT 15 MG PO TABS: 15.0000 mg | ORAL_TABLET | Freq: Every day | ORAL | Status: DC

## 2016-06-21 NOTE — Telephone Encounter (Signed)
Phone call from patient, she would like Ambien or Klonodine or something until she can get the [place].  She is demanding that either [place] or Dr. Harrington Challenger call her today.

## 2016-06-21 NOTE — Telephone Encounter (Signed)
Faxed pt filled out Prior Auth form to pt insurance. Fax number [phone].

## 2016-06-21 NOTE — Telephone Encounter (Signed)
Office receidved Prior [employer] from pt pharmacy for pt Belsomra 15 mg on 06-16-16 and 06-17-16. Pt called today stated she have not been sleeping and stated that pharmacy informed her that office needed Dr. Harrington Challenger to fill out a form. Per pt, she did not know the name of the form and what the form is about. Informed pt that both provider and CMA was out of the office last week and CMA get to her name within the to do folder, she will see what form she is talking about. Per pt she would like to know if Dr. Harrington Challenger could approve samples for her until those forms are filled out. Office faxed Prior Auth to [employer] 06-21-16.

## 2016-06-21 NOTE — Telephone Encounter (Signed)
Printed form and in providers box to sign.

## 2016-06-21 NOTE — Telephone Encounter (Signed)
Pt called and spoke with other staff stating she would like to speak with RMA. Due to RMA was working with another pt, informed other staff to inform pt to come into office and this was around 4:43pm. Per other staff, pt stated that she won't get to office until after 5:00pm. Spoke with Dr. Harrington Challenger and she stated she will get samples for pt to pick up the morning of 06-22-16.

## 2016-06-21 NOTE — Telephone Encounter (Signed)
Prior authorization for Belsomra received. Filled out form online and emailed to office staff to have MD sign and fax to [place].

## 2016-06-21 NOTE — Telephone Encounter (Signed)
Form faxed

## 2016-06-22 ENCOUNTER — Telehealth (HOSPITAL_COMMUNITY): Payer: Self-pay | Admitting: *Deleted

## 2016-06-22 ENCOUNTER — Ambulatory Visit (HOSPITAL_COMMUNITY): Payer: Self-pay | Admitting: Psychiatry

## 2016-06-22 ENCOUNTER — Encounter (HOSPITAL_COMMUNITY): Payer: Self-pay | Admitting: *Deleted
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
## 2016-06-22 NOTE — Telephone Encounter (Signed)
Called pt at 2:02pm on 06-22-16. Called pt back at 2:23pm to inform her that her samples she requested is at office. Informed pt that office noticed she had f/u with Dr. Harrington Challenger on 06-22-16 at 1:45pm and was going to give her samples to her at that time but pt no showed for her appt. Per pt, no one called her to remind her of the appt with Dr. Harrington Challenger and she is currently in [place] at an appt. Informed pt that the form that Dr. Harrington Challenger needed to fill out was a Prior Auth for her Belsomra and that form was submitted to her insurance yesterday. Informed pt to call her pharmacy and see if they could rerun her medication with her insurance again before come by office to see if it went through so she don't have to make an extra trip. Informed pt if it still can not go through then she can still come by office and pt agreed. Pt did not resch appt.

## 2016-06-22 NOTE — Telephone Encounter (Signed)
Samples ready but pt is to be seen today

## 2016-06-23 ENCOUNTER — Encounter (HOSPITAL_COMMUNITY): Payer: Self-pay | Admitting: *Deleted

## 2016-06-23 ENCOUNTER — Telehealth (HOSPITAL_COMMUNITY): Payer: Self-pay | Admitting: *Deleted

## 2016-06-23 ENCOUNTER — Ambulatory Visit (INDEPENDENT_AMBULATORY_CARE_PROVIDER_SITE_OTHER): Payer: Medicaid Other | Admitting: Family Medicine

## 2016-06-23 ENCOUNTER — Encounter: Payer: Self-pay | Admitting: Family Medicine

## 2016-06-23 VITALS — BP 122/82 | Ht 66.0 in | Wt 212.0 lb

## 2016-06-23 DIAGNOSIS — Z79891 Long term (current) use of opiate analgesic: Secondary | ICD-10-CM

## 2016-06-23 DIAGNOSIS — G629 Polyneuropathy, unspecified: Secondary | ICD-10-CM | POA: Diagnosis not present

## 2016-06-23 DIAGNOSIS — R1310 Dysphagia, unspecified: Secondary | ICD-10-CM | POA: Diagnosis not present

## 2016-06-23 MED ORDER — HYDROCODONE-ACETAMINOPHEN 10-325 MG PO TABS: 1.0000 | ORAL_TABLET | Freq: Four times a day (QID) | ORAL | Status: DC | PRN

## 2016-06-23 NOTE — Telephone Encounter (Signed)
noted 

## 2016-06-23 NOTE — Progress Notes (Signed)
Pt came into office to pick up samples that was request per previous call. Belsomra 15 mg LOT number is [id number] with expiration date of 09-2016. Pt D/L number is [id number] with expiration date of 10-31-2023.

## 2016-06-23 NOTE — Telephone Encounter (Signed)
Called [facility] to verify fax was received for Belsomra and check status. Spoke with julianne who gave approval [id number] good from 06/21/16-12/18/16. Called to notify pharmacy.

## 2016-06-23 NOTE — Progress Notes (Signed)
   Subjective:    Patient ID: Penny [name], female    DOB: [birth year], 30 y.o.   MRN: [id number]  HPI Patient arrives for a follow up from ER for flank pain. Patient states she had a normal ct and urine in ED but still having lower right kidney pain. See urine and CT scan results. CAT scan result lab results urine was reviewed I see no sign of any infection patient not in any distress today  Patient was talked to at length about her painful neuropathy that she has had is followed by neurology. Patient currently getting some help with using Topamax it does help her. She also sees pain medications for per day she denies abusing it. The pain management place that she went to at [facility] stated that they would not be doing anything different than what we would be doing. The patient does not want to drive all the way to you. She understands that we will not be going up on the dose of her medicines.  Review of [facility] Drug registry was checked    Objective:   Physical Exam  Lungs clear heart regular subjective discomfort in the lower legs  25 minutes was spent with the patient. Greater than half the time was spent in discussion and answering questions and counseling regarding the issues that the patient came in for today.     Assessment & Plan:  Neuropathy painful-prescription for pain medicine given urine drug screen obtained at this comes back normal we will give her 1 additional prescription she will follow-up in 2 months  She is also under the care psychiatry.  Finally patient was encouraged to call her gastroenterologist regarding her dysphagia she states she would do so, Dr Laural Golden

## 2016-07-01 LAB — TOXASSURE SELECT 13 (MW), URINE: PDF: 0

## 2016-07-02 IMAGING — US US OB LIMITED
1 series · 13 of 20 positions shown · non-contrast
Comparison: none

[Series 1: us fetal bpp w/o nonstress · non-contrast · 20 acquisitions, 13 frames shown]
[im 1/20]
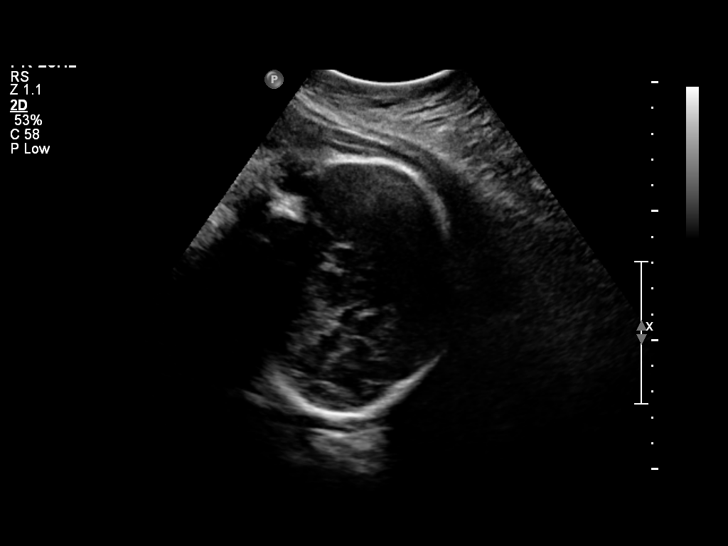
[im 3/20]
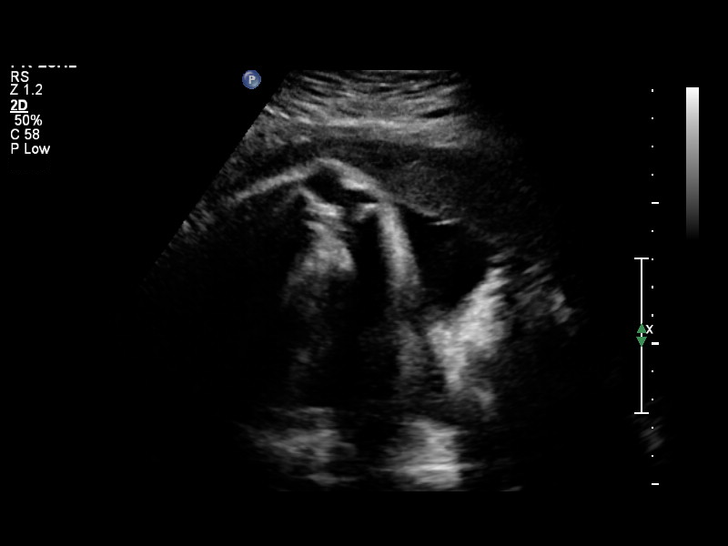
[im 4/20]
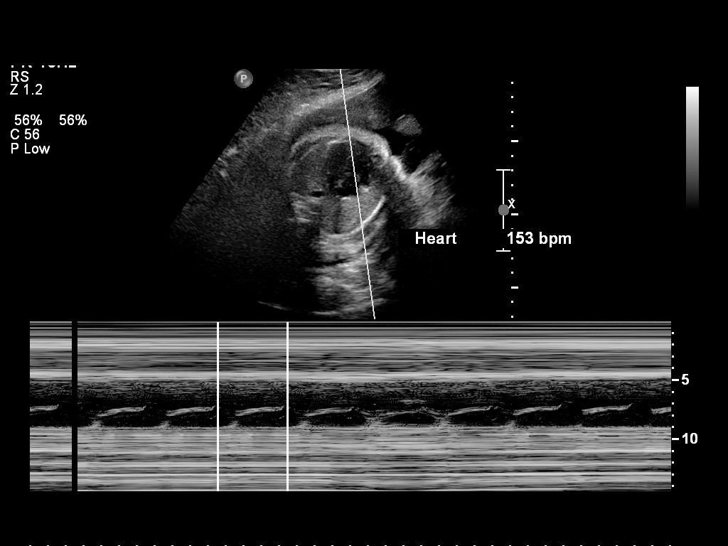
[im 6/20]
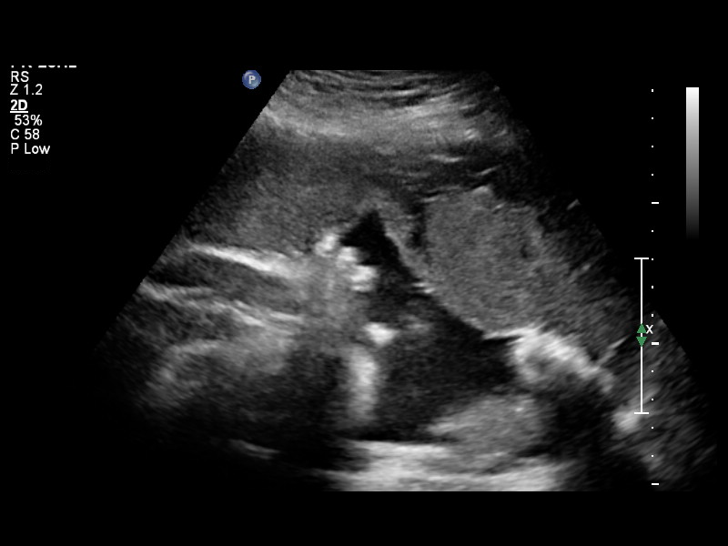
[im 7/20]
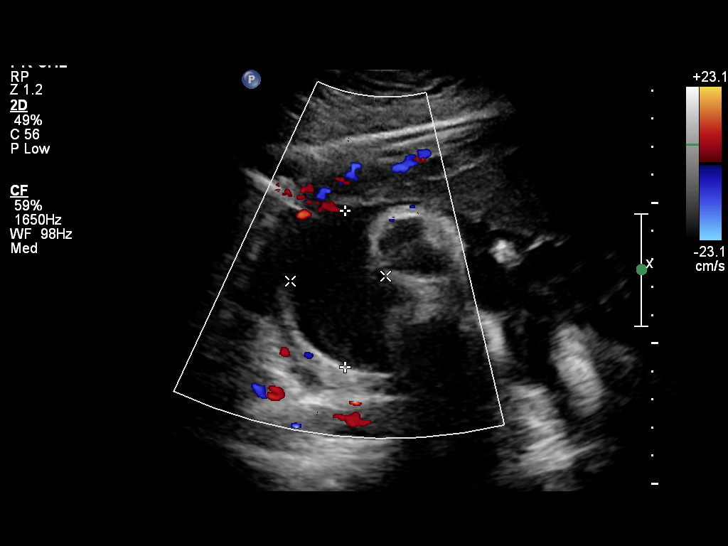
[im 9/20]
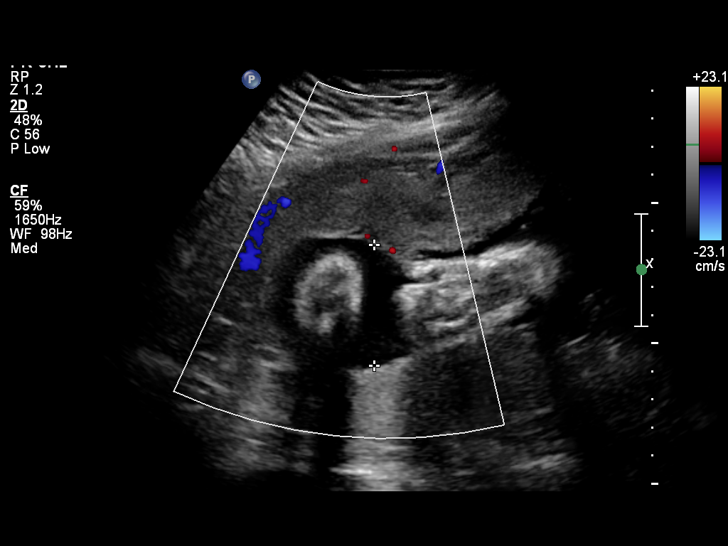
[im 11/20]
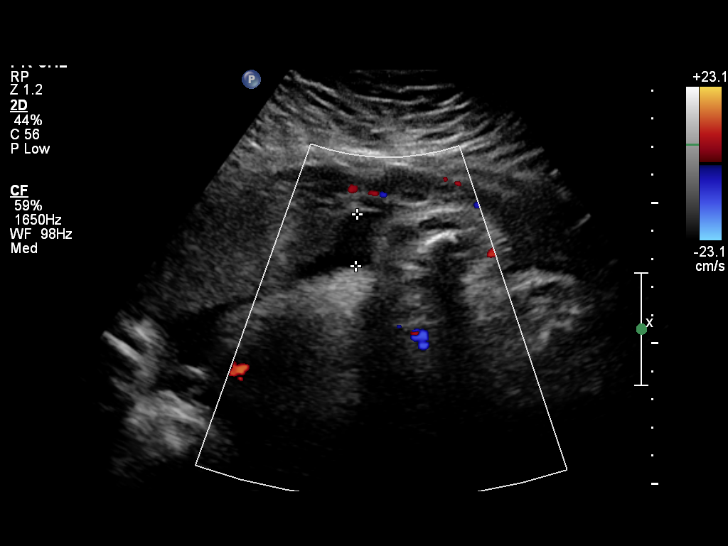
[im 12/20]
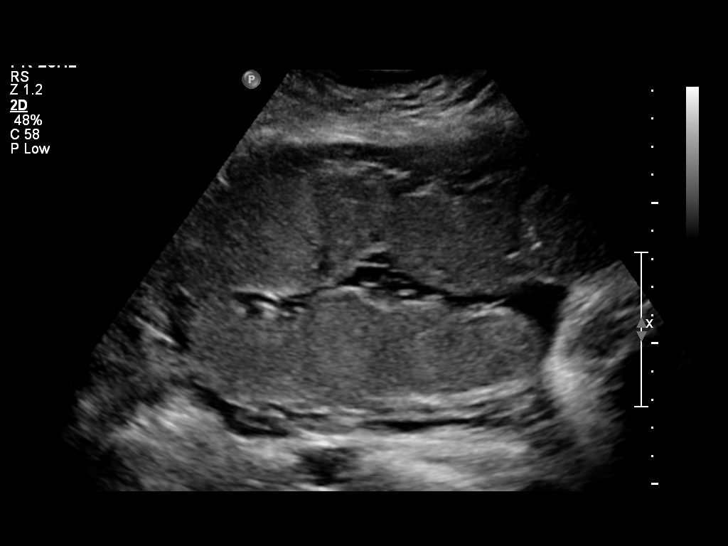
[im 14/20]
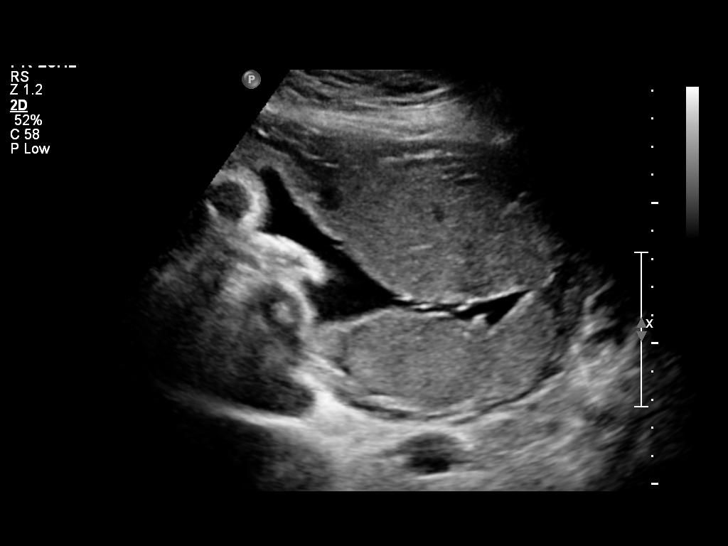
[im 15/20]
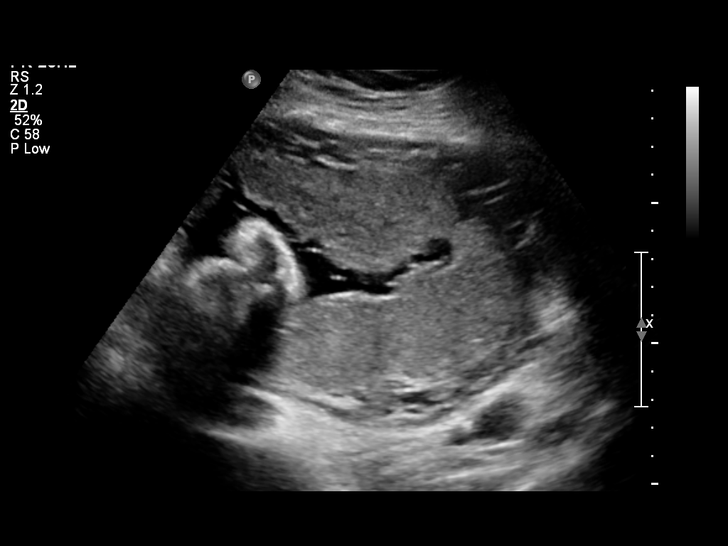
[im 17/20]
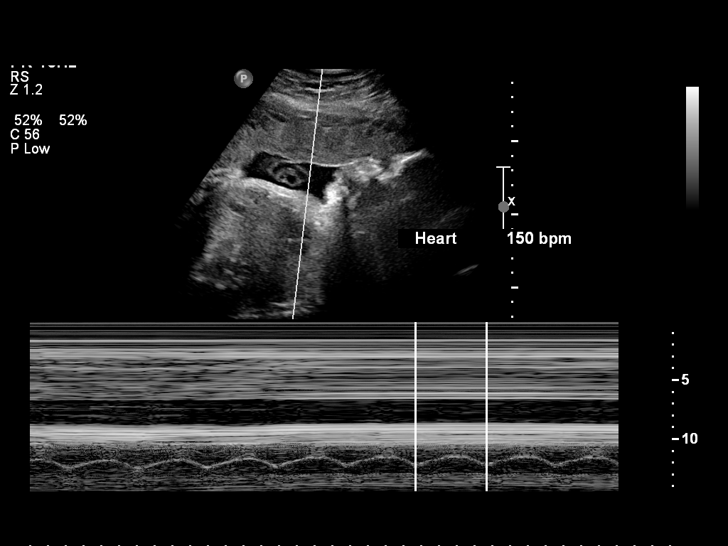
[im 18/20]
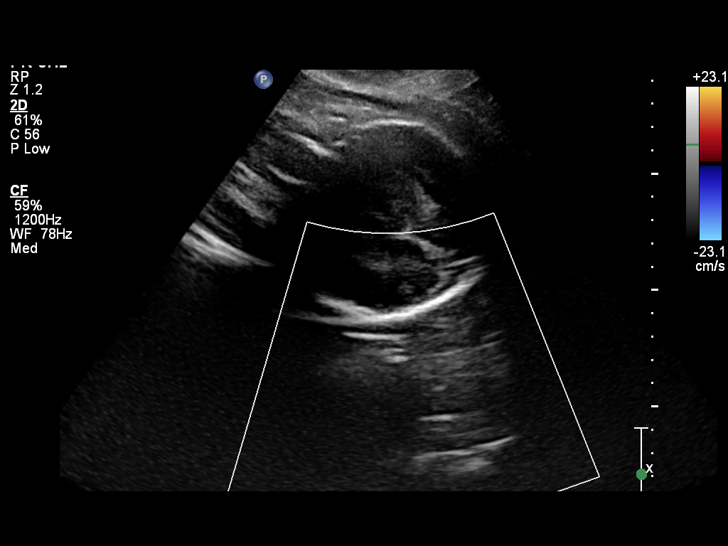
[im 20/20]
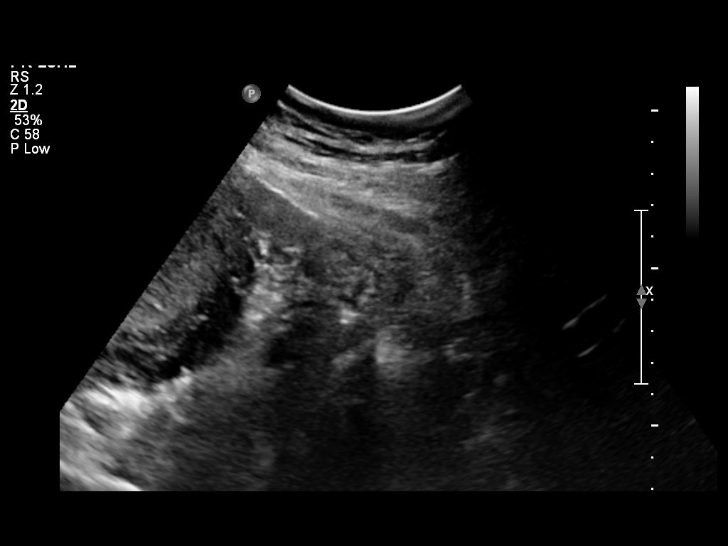

[13 of 20 positions shown; findings below may reference images not displayed]

OBSTETRICS REPORT
                      (Signed Final 07/14/2014 [DATE])

Service(s) Provided

 [HOSPITAL]                                         76815.0
Indications

 Decreased fetal movement
 Non-reactive NST
 Assess fetal well-being
 Assess amniotic fluid volume
 Hypertension - Gestational (Currently on Labetalol)
 Obesity complicating pregnancy                        649.13,
 Previous Gastric Bypass 4885
 Poor obstetric history: Previous gestational HTN
Fetal Evaluation

 Num Of Fetuses:    1
 Fetal Heart Rate:  153                          bpm
 Cardiac Activity:  Observed
 Presentation:      Cephalic
 Placenta:          Succent. lobe (Left lateral
                    above os)

 Amniotic Fluid
 AFI FV:      Subjectively within normal limits
 AFI Sum:     13.98   cm       47  %Tile      Larg Pckt:   5.44  cm
 RUQ:   5.44    cm   RLQ:    1.84   cm    LUQ:   4.28    cm    LLQ:   2.42    cm
Biophysical Evaluation

 Amniotic F.V:   Pocket => 2 cm two         F. Tone:         Observed
                 planes
 F. Movement:    Observed                   Score:           [DATE]
 F. Breathing:   Observed
Gestational Age

 Best:          32w 6d     Det. By:  Early Ultrasound         EDD:   09/02/14
Cervix Uterus Adnexa

 Cervix:       Not visualized (advanced GA >46wks)
 Left Ovary:    Not visualized.
 Right Ovary:   Not visualized.
 Adnexa:     No abnormality visualized.
Impression

 Single IUP at 32w 6d
 BPP [DATE]
 Posterior placenta, suspected left lateral succenturate lobe
 noted
 Normal amniotic fluid volume
Recommendations

 Follow up ultrasounds as clinically indicated

 questions or concerns.

## 2016-07-06 ENCOUNTER — Telehealth: Payer: Self-pay | Admitting: Family Medicine

## 2016-07-06 NOTE — Telephone Encounter (Signed)
Pt is needing a letter from Dr Nicki Reaper for an upcoming court case that she has. Pt wants to talk to Autumn about this when Autumn calls her back tomorrow.

## 2016-07-07 NOTE — Telephone Encounter (Signed)
Patient needs a letter for court for child support stating that she is completely disabled and will never be able to work again. It needs to say when she was dx with the neuropathy and that she is in leg braces and some days can't walk or even get out of bed. It needs to state all he disabling dx and needs letter by August 14th for court on Aug 15. Patient has also scheduled an office visit with you next week to discuss her failed drug test-she does not understand how she failed it or that showed up in her urine.

## 2016-07-07 NOTE — Telephone Encounter (Signed)
Left message to return call 

## 2016-07-08 ENCOUNTER — Telehealth: Payer: Self-pay | Admitting: Family Medicine

## 2016-07-08 ENCOUNTER — Emergency Department (HOSPITAL_COMMUNITY)
Admission: EM | Admit: 2016-07-08 | Discharge: 2016-07-08 | Disposition: A | Discharge status: Home / Self Care | Payer: Medicaid Other | Attending: Emergency Medicine | Admitting: Emergency Medicine

## 2016-07-08 ENCOUNTER — Telehealth: Payer: Self-pay | Admitting: *Deleted

## 2016-07-08 ENCOUNTER — Encounter (HOSPITAL_COMMUNITY): Payer: Self-pay | Admitting: Emergency Medicine

## 2016-07-08 DIAGNOSIS — M545 Low back pain, unspecified: Secondary | ICD-10-CM

## 2016-07-08 DIAGNOSIS — N390 Urinary tract infection, site not specified: Secondary | ICD-10-CM | POA: Diagnosis not present

## 2016-07-08 DIAGNOSIS — Z87891 Personal history of nicotine dependence: Secondary | ICD-10-CM | POA: Diagnosis not present

## 2016-07-08 DIAGNOSIS — Z79899 Other long term (current) drug therapy: Secondary | ICD-10-CM | POA: Diagnosis not present

## 2016-07-08 DIAGNOSIS — R300 Dysuria: Secondary | ICD-10-CM | POA: Diagnosis not present

## 2016-07-08 LAB — URINALYSIS, ROUTINE W REFLEX MICROSCOPIC
BILIRUBIN URINE: NEGATIVE
GLUCOSE, UA: 100 mg/dL — AB
Ketones, ur: NEGATIVE mg/dL
Nitrite: POSITIVE — AB
SPECIFIC GRAVITY, URINE: 1.01 (ref 1.005–1.030)
pH: 5.5 (ref 5.0–8.0)

## 2016-07-08 LAB — URINE MICROSCOPIC-ADD ON

## 2016-07-08 LAB — PREGNANCY, URINE: Preg Test, Ur: NEGATIVE

## 2016-07-08 MED ORDER — CEPHALEXIN 500 MG PO CAPS: 500.0000 mg | ORAL_CAPSULE | Freq: Four times a day (QID) | ORAL | 0 refills | Status: DC

## 2016-07-08 MED ORDER — CEPHALEXIN 500 MG PO CAPS
500.0000 mg | ORAL_CAPSULE | Freq: Once | ORAL | Status: AC
  Administered 2016-07-08: 500 mg via ORAL
  Filled 2016-07-08: qty 1

## 2016-07-08 MED ORDER — HYDROCODONE-ACETAMINOPHEN 5-325 MG PO TABS
2.0000 | ORAL_TABLET | Freq: Once | ORAL | Status: AC
  Administered 2016-07-08: 2 via ORAL
  Filled 2016-07-08: qty 2

## 2016-07-08 MED ORDER — METHOCARBAMOL 500 MG PO TABS: 1000.0000 mg | ORAL_TABLET | Freq: Four times a day (QID) | ORAL | 0 refills | Status: DC | PRN

## 2016-07-08 NOTE — Telephone Encounter (Signed)
Appropriate rec

## 2016-07-08 NOTE — Discharge Instructions (Signed)
Take the prescriptions as directed.  Apply moist heat or ice to the area(s) of discomfort, for 15 minutes at a time, several times per day for the next few days.  Do not fall asleep on a heating or ice pack.  Call your regular medical doctor tomorrow to schedule a follow up appointment in the next 2 days. Call the Urologist and the OB/GYN doctor tomorrow to schedule a follow up appointment within the next week.  Return to the Emergency Department immediately if worsening.

## 2016-07-08 NOTE — Telephone Encounter (Signed)
Patient triage: Patient calls with c/o heavy menstrual bleeding and pain-patient states she has not had a cycle in months and now it has started and is very heavy and painful-patient states she is in significant pain and feels like she has a ruptured ovarian cyst and a flare of cystitis. Patient states her current pain med is not touching the pain and she is having burning with urination. Consult with Dr Richardson Landry who advises patient to go to Ed for evaluation and treatment-advised patient to go to ED for evaluation and treatment. Patient verbalized understanding.

## 2016-07-08 NOTE — ED Triage Notes (Addendum)
Pt here with multiple complaints. Pt reports continued pain to RT flank. States she was worked up for a  kidney stone recently, but none was found. Pt also c/o "flare up of interstitial cystitis" and urinary incontinence.

## 2016-07-08 NOTE — ED Provider Notes (Signed)
[facility] DEPT Provider Note   CSN: [id number] Arrival date & time: 07/08/16  1527  First Provider Contact:  None       History   Chief Complaint Chief Complaint  Patient presents with  . Flank Pain    HPI Penny [name] is a 30 y.o. female.  HPI  Pt was seen at 1540. Per pt, c/o gradual onset and persistence of constant right lower back "pain" for the past 3+ weeks. Pt also c/o on going dysuria, described as "flair of my interstitial cystitis." Denies fevers, no N/V/D, no CP/SOB, no rash, no abd pain, no focal motor weakness, no tingling/numbness in extremities. The symptoms have been associated with no other complaints. The patient has a significant history of similar symptoms previously, recently being evaluated for this complaint and multiple prior evals for same.     Past Medical History:  Diagnosis Date  . ADD (attention deficit disorder)   . Anemia 2011   2o to GASTRIC BYPASS  . Anginal pain ([place])   . Anxiety   . Blood transfusion without reported diagnosis   . Chronic daily headache   . Depression   . Depression   . Dysmenorrhea 09/18/2013  . Dysrhythmia   . Elevated liver enzymes JUL 2011ALK PHOS 111-127 AST  143-267 ALT  213-321T BILI 0.6  ALB  3.7-4.06 Jun 2011 ALK PHOS 118 AST 24 ALT 42 T BILI 0.4 ALB 3.9  . Encounter for drug rehabilitation    at behavioral health for opioid addiction about 4 years ago  . Family history of adverse reaction to anesthesia    'dad had to be kept on pump for breathing for morphine'  . Fatty liver   . Fibromyalgia   . Gastric bypass status for obesity   . Gastritis JULY 2011  . Heart murmur   . 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 History of Holter monitoring   . Hx of opioid abuse    for about 4 years, about 4 years ago  . Interstitial cystitis   . Iron deficiency anemia 07/23/2010  . Irritable bowel syndrome 2012 DIARRHEA   JUN 2012 TTG IgA 14.9  . IUD FEB 2010  . Lupus ([place])   .  Menorrhagia 07/18/2013  . Migraines   . Obesity (BMI 30-39.9) 2011 228 LBS BMI 36.8  . Ovarian cyst   . Patient desires pregnancy 09/18/2013  . PONV (postoperative nausea and vomiting) seizure post-operatively  . Potassium (K) deficiency   . Pregnant 12/25/2013  . Psychiatric pseudoseizure   . RLQ abdominal pain 07/18/2013  . Sciatica of left side 11/14/2014  . 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 Stress 09/18/2013    Patient Active Problem List   Diagnosis Date Noted  . Complaints of leg weakness   . Paresthesia   . Left-sided weakness   . Uncontrolled pain 01/24/2016  . Malnutrition of moderate degree 01/24/2016  . Neuropathy ([place]) 01/23/2016  . Inability to walk 01/23/2016  . Paresthesias   . Bilateral leg numbness 11/26/2015  . 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 Post partum depression 05/09/2015  . Anastomotic ulcer   . Dysphagia   . Hematemesis 04/07/2015  . Intractable vomiting with nausea 04/07/2015  . Elevated liver enzymes   . Epigastric pain   . Pseudoseizure ([place]) 02/22/2015  . Seizure-like activity ([place]) 02/22/2015  . Fibromyalgia 02/18/2015  . Vulvar fissure 02/18/2015  . Clinical depression 02/01/2015  .  Current smoker 01/29/2015  . Current tobacco use 01/29/2015  . 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 Leg weakness, bilateral 01/02/2015  . Gait disorder 01/02/2015  . Other symptoms and signs involving the musculoskeletal system 01/02/2015  . Abnormal gait 01/02/2015  . Cervicalgia 12/18/2014  . Sciatica of left side 11/14/2014  . Neuralgia neuritis, sciatic nerve 11/14/2014  . 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 Family planning 09/09/2014  . Headache, migraine 08/19/2014  . Seizure ([place]) 08/18/2014  . Encephalopathy 08/13/2014  . Headache 08/13/2014  . Altered mental status 08/13/2014  . Right leg numbness 07/31/2014  . Disturbance of skin sensation 07/31/2014  .  Hypertension in pregnancy, transient 07/08/2014  . Previous gastric bypass affecting pregnancy, antepartum 05/22/2014  . 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 ASD (atrial septal defect), ostium secundum 05/17/2014  . Depression complicating pregnancy in second trimester, antepartum 05/09/2014  . Antepartum mental disorder in pregnancy 05/09/2014  . Rapid palpitations 02/18/2014  . H/O maternal third degree perineal laceration, currently pregnant 02/18/2014  . High-risk pregnancy 02/18/2014  . Supervision of pregnancy with other poor reproductive or obstetric history, unspecified trimester 02/18/2014  . Maternal iron deficiency anemia complicating pregnancy 27/25/3664  . Restless legs 01/16/2014  . Restless leg 01/16/2014  . Chronic interstitial cystitis 10/23/2013  . Menorrhagia 07/18/2013  . Excessive and frequent menstruation 07/18/2013  . h/o Opiate addiction 03/11/2012    Class: Acute  . History of migraine headaches 03/10/2012    Class: Acute  . Depression with anxiety 03/10/2012    Class: Chronic  . 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 Panic disorder without agoraphobia 03/10/2012  . H/O disease 03/10/2012  . Dysthymia 03/10/2012  . Pelvic congestion syndrome 10/13/2011  . Coitalgia 10/13/2011  . Chronic migraine without aura 10/13/2011  . Unspecified dyspareunia 10/13/2011  . IBS (irritable bowel syndrome) 08/25/2011  . Diarrhea 05/27/2011  . OBESITY, UNSPECIFIED 09/17/2010  . Transaminitis 09/17/2010  . Iron deficiency anemia 07/23/2010    Past Surgical History:  Procedure Laterality Date  . BIOPSY  04/10/2015   Procedure: BIOPSY;  Surgeon: Daneil Dolin, MD;  Location: [facility];  Service: Endoscopy;;  . cath self every nite     for sodium bicarb injection (discontinued 2013)  . CHOLECYSTECTOMY  2005     biliary dyskinesia  . COLONOSCOPY  JUN 2012 ABD PN/DIARRHEA WITH PROPOFOL   NL COLON  . DILATION AND CURETTAGE OF UTERUS    . ESOPHAGEAL DILATION N/A 04/10/2015   Procedure: ESOPHAGEAL DILATION WITH 54FR MALONEY DILATOR;  Surgeon: Daneil Dolin, MD;  Location: [facility];  Service: Endoscopy;  Laterality: N/A;  . ESOPHAGOGASTRODUODENOSCOPY    . ESOPHAGOGASTRODUODENOSCOPY (EGD) WITH PROPOFOL N/A 04/10/2015   Procedure: ESOPHAGOGASTRODUODENOSCOPY (EGD) WITH PROPOFOL;  Surgeon: Daneil Dolin, MD;  Location: [facility];  Service: Endoscopy;  Laterality: N/A;  . GAB  2007   in [place]-POUCH 5 CM  . GASTRIC BYPASS  06/2006  . HYSTEROSCOPY W/D&C N/A 09/12/2014   Procedure: DILATATION AND CURETTAGE /HYSTEROSCOPY;  Surgeon: Jonnie Kind, MD;  Location: [facility];  Service: Gynecology;  Laterality: N/A;  . REPAIR VAGINAL CUFF N/A 07/30/2014   Procedure: REPAIR VAGINAL CUFF;  Surgeon: Mora Bellman, MD;  Location: [facility];  Service: Gynecology;  Laterality: N/A;  . SAVORY DILATION  06/20/2012   Dr. Barnie Alderman gastritis/Ulcer in the  mid jejunum. Empiric dilation.   . SURAL NERVE BX Left 02/25/2016   Procedure: LEFT SURAL NERVE BIOPSY;  Surgeon: Jovita Gamma, MD;  Location: [facility];  Service: Neurosurgery;  Laterality: Left;  Left sural nerve biopsy  . TONSILLECTOMY    . TONSILLECTOMY AND ADENOIDECTOMY    . UPPER GASTROINTESTINAL ENDOSCOPY  JULY 2011 NAUSEA-D125,V6, PH 25   Bx; GASTRITIS, POUCH-5 CM LONG  . WISDOM TOOTH EXTRACTION      OB History    Gravida Para Term Preterm AB Living   '3 2 1 1 1 2   '$ SAB TAB Ectopic Multiple Live Births   1       2       Home Medications    Prior to Admission medications   Medication Sig Start Date End Date Taking? Authorizing Provider  albuterol (PROVENTIL HFA;VENTOLIN HFA) 108 (90 Base) MCG/ACT inhaler Inhale 2 puffs into the lungs every 6 (six) hours as needed for wheezing. Patient not taking: Reported on 06/09/2016 05/28/16   Kathyrn Drown, MD   amphetamine-dextroamphetamine (ADDERALL XR) 30 MG 24 hr capsule Take 1 capsule (30 mg total) by mouth daily. Patient not taking: Reported on 06/09/2016 05/06/16   Cloria Spring, MD  APAP-Pamabrom-Pyrilamine (MENSTRUAL PAIN RELIEF) 500-25-15 MG TABS Take 1-2 tablets by mouth every 30 (thirty) days. Reported on 06/09/2016    Historical Provider, MD  busPIRone (BUSPAR) 10 MG tablet Take 1 tablet (10 mg total) by mouth 3 (three) times daily. Patient not taking: Reported on 06/09/2016 05/26/16   Cloria Spring, MD  carbamazepine (TEGRETOL) 200 MG tablet Take two tablets at bedtime Patient not taking: Reported on 06/09/2016 03/01/16   Cloria Spring, MD  DULoxetine (CYMBALTA) 60 MG capsule Take 1 capsule (60 mg total) by mouth 2 (two) times daily. Patient not taking: Reported on 06/09/2016 05/06/16   Cloria Spring, MD  fluconazole (DIFLUCAN) 200 MG tablet Take 1 tablet by mouth for 7 days Patient not taking: Reported on 06/09/2016 06/01/16   Kathyrn Drown, MD  folic acid (FOLVITE) 888 MCG tablet Take 800 mcg by mouth at bedtime. Reported on 06/09/2016    Historical Provider, MD  HYDROcodone-acetaminophen (NORCO) 10-325 MG tablet Take 1 tablet by mouth every 6 (six) hours as needed. 06/23/16   Kathyrn Drown, MD  pantoprazole (PROTONIX) 40 MG tablet Take 1 tablet (40 mg total) by mouth daily. 06/10/16   Kathyrn Drown, MD  phenazopyridine (PYRIDIUM) 100 MG tablet Take 1 tablet (100 mg total) by mouth 3 (three) times daily as needed for pain. Patient not taking: Reported on 06/09/2016 02/26/16   Kathyrn Drown, MD  predniSONE (DELTASONE) 20 MG tablet Take 2 tablets by mouth for 3 days, then take 1 tablet by mouth for 3 days Patient not taking: Reported on 06/09/2016 05/31/16   Kathyrn Drown, MD  promethazine (PHENERGAN) 25 MG suppository Place 1 suppository (25 mg total) rectally every 6 (six) hours as needed for nausea or vomiting. 06/15/16   Milton Ferguson, MD  Suvorexant (BELSOMRA) 15 MG TABS Take 15 mg by mouth daily. 06/21/16    Cloria Spring, MD  tiZANidine (ZANAFLEX) 4 MG tablet Take 1 tablet by mouth 3 times a day as needed. For home use only. Caution drowsiness. Do not drive on medication Patient not taking: Reported on 06/09/2016 06/01/16   Kathyrn Drown, MD  topiramate (TOPAMAX) 50 MG tablet TAKE 1 TABLET BY MOUTH EACH MORNING AND 2 TABLETS IN THE EVENING  AS NEEDED FOR MIGRAINES. Patient not taking: Reported on 06/09/2016 03/09/16   Kathyrn Drown, MD    Family History Family History  Problem Relation Age of Onset  . Hemochromatosis Maternal Grandmother   . Migraines Maternal Grandmother   . Cancer Maternal Grandmother   . Breast cancer Maternal Grandmother   . Hypertension Father   . Diabetes Father   . Coronary artery disease Father   . Migraines Paternal Grandmother   . Breast cancer Paternal Grandmother   . Cancer Mother     breast  . Hemochromatosis Mother   . Breast cancer Mother   . Depression Mother   . Anxiety disorder Mother   . Coronary artery disease Paternal Grandfather   . Anxiety disorder Brother   . Bipolar disorder Brother   . Healthy Son   . Healthy Daughter     Social History Social History  Substance Use Topics  . Smoking status: Former Smoker    Packs/day: 0.25    Years: 6.00    Types: Cigarettes    Quit date: 11/29/2015  . Smokeless tobacco: Never Used  . Alcohol use 0.0 oz/week     Comment: once every weeks - maybe a beer with a dinner, rarely     Allergies   Gabapentin; Metoclopramide hcl; Tramadol; Morphine and related; Nucynta [tapentadol]; Propofol; Trazodone and nefazodone; Zofran [ondansetron]; Butrans [buprenorphine]; Latex; Lyrica [pregabalin]; and Tape   Review of Systems Review of Systems ROS: Statement: All systems negative except as marked or noted in the HPI; Constitutional: Negative for fever and chills. ; ; Eyes: Negative for eye pain, redness and discharge. ; ; ENMT: Negative for ear pain, hoarseness, nasal congestion, sinus pressure and sore  throat. ; ; Cardiovascular: Negative for chest pain, palpitations, diaphoresis, dyspnea and peripheral edema. ; ; Respiratory: Negative for cough, wheezing and stridor. ; ; Gastrointestinal: Negative for nausea, vomiting, diarrhea, abdominal pain, blood in stool, hematemesis, jaundice and rectal bleeding. . ; ; Genitourinary: +dysuria. Negative for flank pain and hematuria. ; ; GYN:  No pelvic pain, no vaginal discharge, no vulvar pain. ;; Musculoskeletal: +LBP. Negative for neck pain. Negative for swelling and trauma.; ; Skin: Negative for pruritus, rash, abrasions, blisters, bruising and skin lesion.; ; Neuro: Negative for headache, lightheadedness and neck stiffness. Negative for weakness, altered level of consciousness, altered mental status, extremity weakness, paresthesias, involuntary movement, seizure and syncope.      Physical Exam Updated Vital Signs BP 137/72 (BP Location: Left Arm)   Pulse 97   Temp 98.2 F (36.8 C) (Oral)   Resp 16   Ht '5\' 6"'$  (1.676 m)   Wt 213 lb (96.6 kg)   LMP 07/05/2016 Comment: Hcg <5  SpO2 99%   BMI 34.38 kg/m   Physical Exam 1545: Physical examination:  Nursing notes reviewed; Vital signs and O2 SAT reviewed;  Constitutional: Well developed, Well nourished, Well hydrated, In no acute distress; Head:  Normocephalic, atraumatic; Eyes: EOMI, PERRL, No scleral icterus; ENMT: Mouth and pharynx normal, Mucous membranes moist; Neck: Supple, Full range of motion, No lymphadenopathy; Cardiovascular: Regular rate and rhythm, No murmur, rub, or gallop; Respiratory: Breath sounds clear & equal bilaterally, No rales, rhonchi, wheezes.  Speaking full sentences with ease, Normal respiratory effort/excursion; Chest: Nontender, Movement normal; Abdomen: Soft, Nontender, Nondistended, Normal bowel sounds; Genitourinary: No CVA tenderness; Spine:  No midline CS, TS, LS tenderness. +TTP right lumbar paraspinal muscles. No rash, no soft tissue crepitus, no ecchymosis.;;  Extremities: Pulses normal, No tenderness, No edema, No  calf edema or asymmetry.; Neuro: AA&Ox3, Major CN grossly intact.  Speech clear. No gross focal motor or sensory deficits in extremities. Climbs on and off stretcher easily by herself. Gait steady.; Skin: Color normal, Warm, Dry.   ED Treatments / Results  Labs (all labs ordered are listed, but only abnormal results are displayed)   EKG  EKG Interpretation None       Radiology   Procedures Procedures (including critical care time)  Medications Ordered in ED Medications  HYDROcodone-acetaminophen (NORCO/VICODIN) 5-325 MG per tablet 2 tablet (not administered)     Initial Impression / Assessment and Plan / ED Course  I have reviewed the triage vital signs and the nursing notes.  Pertinent labs & imaging results that were available during my care of the patient were reviewed by me and considered in my medical decision making (see chart for details).  MDM Reviewed: previous chart, nursing note and vitals Reviewed previous: CT scan, MRI and labs Interpretation: labs    Ct Renal Stone Study Result Date: 06/15/2016 CLINICAL DATA:  Pain, unable to urinate for 1 week EXAM: CT ABDOMEN AND PELVIS WITHOUT CONTRAST TECHNIQUE: Multidetector CT imaging of the abdomen and pelvis was performed following the standard protocol without IV contrast. COMPARISON:  06/11/2015 FINDINGS: Lower chest:  The lung bases are unremarkable. Hepatobiliary: Unenhanced liver shows no biliary ductal dilatation. The patient is status postcholecystectomy. Pancreas: Unenhanced pancreas is unremarkable. Spleen: Unenhanced spleen is unremarkable. Adrenals/Urinary Tract: No adrenal gland mass. Unenhanced kidneys are symmetrical in size. No hydronephrosis or hydroureter. No calcified ureteral calculi. No nephrolithiasis. Moderate distended urinary bladder. No calcified calculi are noted within urinary bladder. Stomach/Bowel: The patient is status post gastric  bypass surgery. No small bowel obstruction. Terminal ileum is unremarkable. There is no pericecal inflammation. Normal appendix partially visualized in coronal image 55. Moderate stool noted within descending colon. Moderate stool noted within sigmoid colon. No distal colonic obstruction. Vascular/Lymphatic: No retroperitoneal or mesenteric adenopathy. No aortic aneurysm. Reproductive: The unenhanced uterus is anteflexed.  No adnexal mass. Other: No ascites or free air.  No inguinal adenopathy. Musculoskeletal: No destructive bony lesions are noted. No acute fractures. IMPRESSION: 1. There is no evidence of nephrolithiasis. No hydronephrosis or hydroureter. 2. No calcified ureteral calculi. 3. Status post gastric bypass surgery. 4. Moderate distended urinary bladder. No evidence of calcified calculi within urinary bladder. 5. Status postcholecystectomy. 6. No pericecal inflammation.  Normal appendix partially visualized. 7. Moderate colonic stool noted within distal colon. No evidence of colonic obstruction. 8. Electronically Signed   By: Lahoma Crocker M.D.   On: 06/15/2016 18:53     Results for orders placed or performed during the hospital encounter of 07/08/16  Pregnancy, urine  Result Value Ref Range   Preg Test, Ur NEGATIVE NEGATIVE  Urinalysis, Routine w reflex microscopic  Result Value Ref Range   Color, Urine ORANGE (A) YELLOW   APPearance CLEAR CLEAR   Specific Gravity, Urine 1.010 1.005 - 1.030   pH 5.5 5.0 - 8.0   Glucose, UA 100 (A) NEGATIVE mg/dL   Hgb urine dipstick LARGE (A) NEGATIVE   Bilirubin Urine NEGATIVE NEGATIVE   Ketones, ur NEGATIVE NEGATIVE mg/dL   Protein, ur TRACE (A) NEGATIVE mg/dL   Nitrite POSITIVE (A) NEGATIVE   Leukocytes, UA MODERATE (A) NEGATIVE  Urine microscopic-add on  Result Value Ref Range   Squamous Epithelial / LPF 0-5 (A) NONE SEEN   WBC, UA TOO NUMEROUS TO COUNT 0 - 5 WBC/hpf   RBC / HPF  TOO NUMEROUS TO COUNT 0 - 5 RBC/hpf   Bacteria, UA FEW (A) NONE  SEEN   *Note: Due to a large number of results and/or encounters for the requested time period, some results have not been displayed. A complete set of results can be found in Results Review.      1540:  Pt with recent ED visit and PMD f/u visit for right flank pain and dysuria; with reassuring CT scan, labs, UA. Pt has also had MRI CS/TS/LS 5 months ago with stable small lumbar disc protrusion. Pt is neurologically intact today, walking around the exam room, leaning over the stretcher then standing again. Pt starts to moan in pain as I reach for her right lumbar paraspinal muscles (before I touch them).  Given recent reassuring dx testing (for previous complaint of left flank pain) and dysuria, I offered to check UA/UC and perform pelvic exam today, as her c/o "burning" in her perineal area may be due to GYN issue. Pt now mentioned that she was having "brown" vaginal bleeding for the past several days. Denies pelvic pain, no vaginal discharge. Pt is also requesting pain medicine. Pt told me that she did not get any pain medications from Dr. Wolfgang Phoenix, nor go to [facility] to seen Pain Management. EPIC chart reviewed: pt was at [facility] on 06/22/16 and in Dr. Lance Sell office on 06/23/16. Pt then told me that she did have a rx for pain medication from Dr. Wolfgang Phoenix but "it wasn't working" and she "wanted something else." [place] Controlled Substance Database accessed: pt filled hydrocodone '10mg'$ /APAP '325mg'$ , #120, on 06/27/16. Pt informed she would receive a dose of her usual pain medication here, and that she would not be rx any new narcotic medication. Pt then refused pelvic exam. Pt ambulatory to the bathroom to give UA.  1700:  Pt still c/o back pain and is requesting more pain meds, but is now laying on the stretcher, appearing more comfortable. ED RN states pt is requesting "an IV" for more pain medication, as well as "if I need an antibiotic." Pt taking pyridium, likely confounding Udip results. Will obtain UC.  Informed pt again she will not be receiving IV abx, but I will give her an IM injection and rx for PO antibiotics. Pt refuses, stating she will "just take a pill." Pt also continues to refuse pelvic exam. Will now d/c pt, as she will not participate with any plan of care, and continues to only request more narcotics.  Pt with long hx of chronic pain. Pt strongly encouraged to f/u with her Uro MD, OB/GYN MD, PMD and Pain Management doctor for good continuity of care and control of her chronic pain.  Pt verb understanding.       Final Clinical Impressions(s) / ED Diagnoses   Final diagnoses:  None    New Prescriptions New Prescriptions   No medications on file     Francine Graven, DO 07/11/16 1420

## 2016-07-08 NOTE — Telephone Encounter (Signed)
ERROR

## 2016-07-10 LAB — URINE CULTURE

## 2016-07-14 ENCOUNTER — Ambulatory Visit (HOSPITAL_COMMUNITY): Payer: Self-pay | Admitting: Psychiatry

## 2016-07-14 ENCOUNTER — Telehealth (HOSPITAL_COMMUNITY): Payer: Self-pay | Admitting: *Deleted

## 2016-07-14 ENCOUNTER — Other Ambulatory Visit (HOSPITAL_COMMUNITY): Payer: Self-pay | Admitting: Psychiatry

## 2016-07-14 ENCOUNTER — Encounter (HOSPITAL_COMMUNITY): Payer: Self-pay

## 2016-07-14 NOTE — Telephone Encounter (Signed)
patient came late for appointment, rescheduled.  She would like refill on all medications.

## 2016-07-15 ENCOUNTER — Encounter: Payer: Self-pay | Admitting: Family Medicine

## 2016-07-15 ENCOUNTER — Telehealth (HOSPITAL_COMMUNITY): Payer: Self-pay | Admitting: *Deleted

## 2016-07-15 ENCOUNTER — Other Ambulatory Visit (HOSPITAL_COMMUNITY): Payer: Self-pay | Admitting: Psychiatry

## 2016-07-15 ENCOUNTER — Telehealth: Payer: Self-pay | Admitting: Family Medicine

## 2016-07-15 ENCOUNTER — Ambulatory Visit (INDEPENDENT_AMBULATORY_CARE_PROVIDER_SITE_OTHER): Payer: Medicaid Other | Admitting: Family Medicine

## 2016-07-15 VITALS — BP 112/72 | Ht 66.0 in | Wt 217.0 lb

## 2016-07-15 DIAGNOSIS — R339 Retention of urine, unspecified: Secondary | ICD-10-CM

## 2016-07-15 DIAGNOSIS — G629 Polyneuropathy, unspecified: Secondary | ICD-10-CM

## 2016-07-15 MED ORDER — SUVOREXANT 15 MG PO TABS: 15.0000 mg | ORAL_TABLET | Freq: Every day | ORAL | 0 refills | Status: DC

## 2016-07-15 MED ORDER — DULOXETINE HCL 60 MG PO CPEP: 60.0000 mg | ORAL_CAPSULE | Freq: Two times a day (BID) | ORAL | 2 refills | Status: DC

## 2016-07-15 MED ORDER — CARBAMAZEPINE 200 MG PO TABS: ORAL_TABLET | ORAL | 2 refills | Status: DC

## 2016-07-15 MED ORDER — TIZANIDINE HCL 2 MG PO TABS: ORAL_TABLET | ORAL | 3 refills | Status: DC

## 2016-07-15 MED ORDER — BUSPIRONE HCL 10 MG PO TABS: 10.0000 mg | ORAL_TABLET | Freq: Three times a day (TID) | ORAL | 2 refills | Status: DC

## 2016-07-15 NOTE — Telephone Encounter (Signed)
patient came late for appointment, rescheduled yesterday. Per pt, would like refill for all medications Dr. Harrington Challenger prescribe.

## 2016-07-15 NOTE — Telephone Encounter (Signed)
May cancel, please send pt notice this was cancelled but when I saw her today I recommended following up in 3 months(we are no longer rx pain meds)

## 2016-07-15 NOTE — Telephone Encounter (Signed)
phone call from patient, who was very aggressive regarding text message reminder for an appointment.  She asked who the appointment was for on 07/19/16, which I could not provide that information to her since she did not have an appointment on that date.    She confirmed her appointment for 08/19/16 at 1:00.  She then asked about the text message she received regarding an appointment on 07/19/16 at 1:15.   She asked who she was seeing on that day.  It was explained to her again that I could only give her the dates and times I was able to see in her chart, which was 08/19/16.   She then asked again, aggressively raising her voice, who the appointment was for on 07/19/16.   It was explained to her again that when the messages go out to remind patients of their appointments, names are not given at that time due to confidentiality.   Only dates and times are provided as a courtesy to our patients.  She continued to argue and complain that we need to fix the text for appointment reminders.    She asked for a phone number so she could complain about the text reminders. In my effort to attempt to help her, I asked if I could put her on hold for a moment.   At that time our administrator, Beatriz Stallion was contacted for assistance with this matter.   Randall Hiss said to give her his cell number so she could call him.   Patient was given his cell number to call regarding her complaint.

## 2016-07-15 NOTE — Telephone Encounter (Signed)
Letter done

## 2016-07-15 NOTE — Progress Notes (Addendum)
   Subjective:    Patient ID: Penny [name], female    DOB: 1985-12-31, 30 y.o.   MRN: [id number]  HPI Patient is here today to discuss her recent urine drug screen results. Patient also needs a letter from the doctor for her to take to her court hearing.  Long discussion held regarding recent urine drug screen plus also a court hearing she has plus also her disability. She has severe painful neuropathy  Review of Systems She relates painful neuropathy she relates weakness in the legs     Objective:   Physical Exam Lungs are clear hearts regular pulse normal  25 minutes was spent with the patient. Greater than half the time was spent in discussion and answering questions and counseling regarding the issues that the patient came in for today.      Assessment & Plan:  Patient with painful neuropathy in her legs she also has intermittent weakness in the legs due to the neuropathy. She is seeing neurology. This was diagnosed in the spring time. This patient is now disabled. She is applied for disability but it is a long drawn out process.  Due to irregularity with recent urine drug screen we will not be prescribing medications for this patient. The patient has been counseled regarding this. She states she was away from her medications and she took one of her dad's medication and she states that happened to be oxycodone.   Our practice will not be able to prescribe controlled medications for this patient  Patient will follow-up in 3-4 months  Patient also having intermittent urinary retention referral to urology.

## 2016-07-15 NOTE — Telephone Encounter (Signed)
All sent except you will have to call in belsommra

## 2016-07-15 NOTE — Telephone Encounter (Signed)
This note is in reference to note entered earlier today around 12:15 regarding this patient. I forgot to include that the text message this patient was so upset and rude about was not regarding her own appointment.   The text message was regarding an appointment for her child.  When she asked when was her child's appointment, it was discovered that the text she received was for her child's appointment.

## 2016-07-15 NOTE — Telephone Encounter (Signed)
Pt is on the schedule for 9/14, does she need to keep this appt? Pt was unsure so I am asking if I need to cancel it

## 2016-07-15 NOTE — Telephone Encounter (Signed)
Per previous calls, Dr. Harrington Challenger stated to call in pt Belsomra 15mg . Called pt pharmacy and spoke with Lovena Le and gived approval for 30 tabs 0 refills.

## 2016-07-16 NOTE — Telephone Encounter (Signed)
Ok, sept appt canceled and letter mailed to be mailed for 3 mo ov

## 2016-07-16 NOTE — Telephone Encounter (Signed)
Opened in Error.

## 2016-07-16 NOTE — Telephone Encounter (Signed)
Belsomra was called into pt pharmacy 07-15-16.

## 2016-07-18 IMAGING — MR MR LUMBAR SPINE W/O CM
4 of 5 series · 18 of 48 positions shown · non-contrast
Comparison: CT Abdomen and Pelvis 11/13/2012.

CLINICAL DATA: 27-year-old female status post vaginal delivery with
epidural anesthesia, now with lumbosacral plexopathy. Initial
encounter. Possible epidural hematoma.

EXAM:
MRI LUMBAR SPINE WITHOUT CONTRAST
TECHNIQUE: Multiplanar, multisequence MR imaging of the lumbar spine was
performed. No intravenous contrast was administered.

[Series 3: T1 · sagittal · 4.0mm · 0.55mm/px · 3 of 14 slices shown (1 of 2)]
[im 3/14]
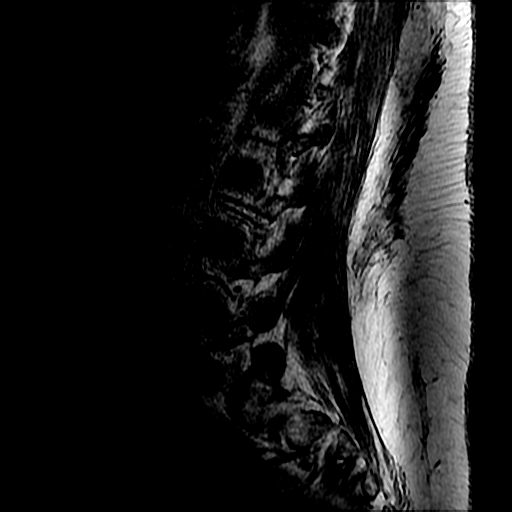
[im 8/14]
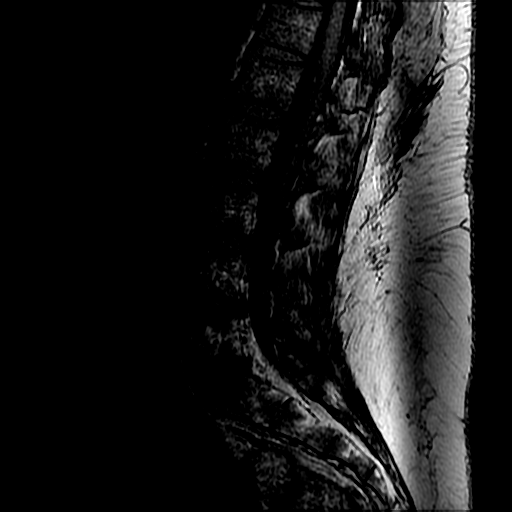
[im 14/14]
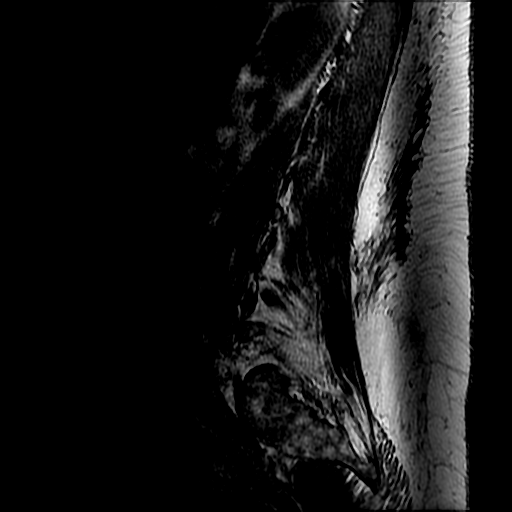

[Series 5: T2 post-contrast · sagittal · 4.0mm · 0.55mm/px · 6 of 14 slices shown]
[im 1/14]
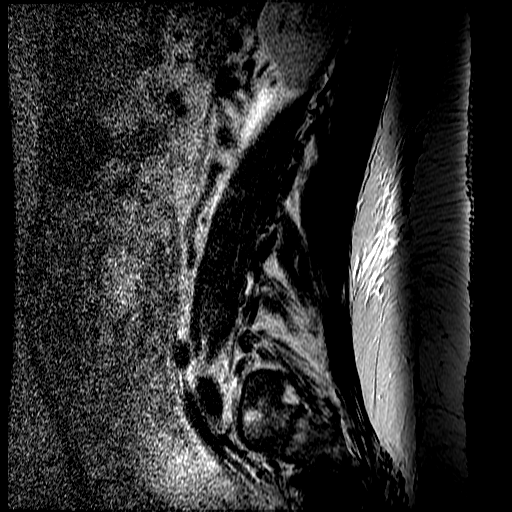
[im 3/14]
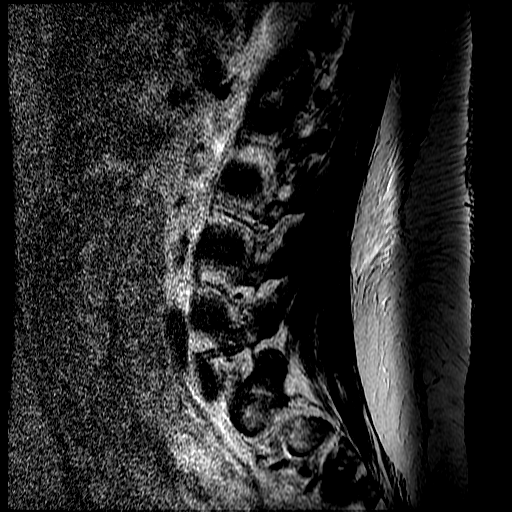
[im 6/14]
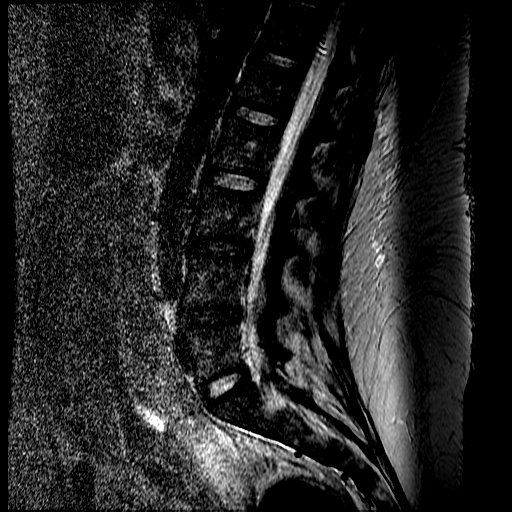
[im 8/14]
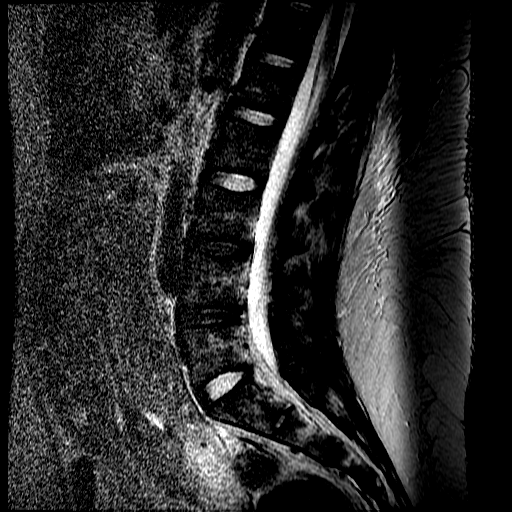
[im 11/14]
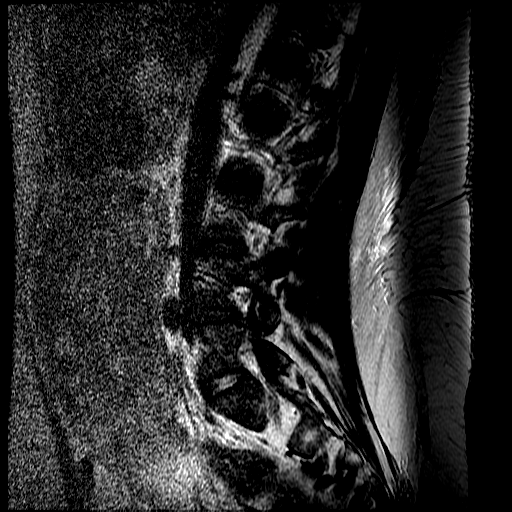
[im 14/14]
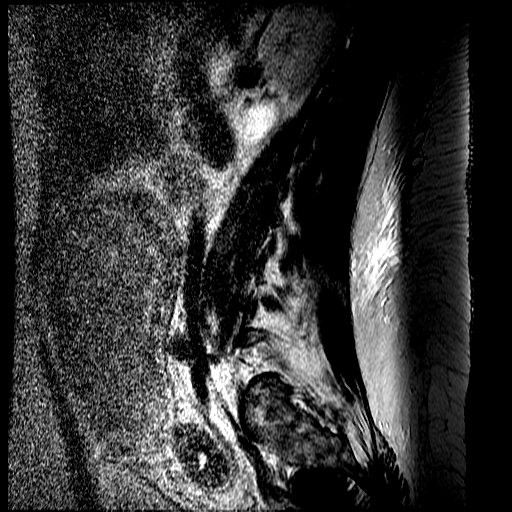

[Series 7: T2 · axial · 4.0mm · 0.39mm/px · z∈[+8,+186]mm · 6 of 36 slices shown]
[im 1/36]
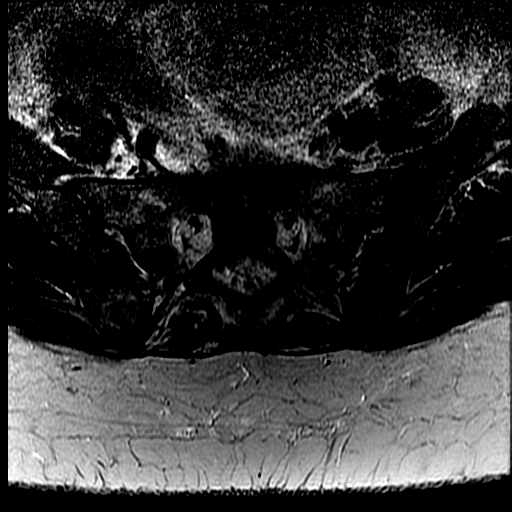
[im 6/36]
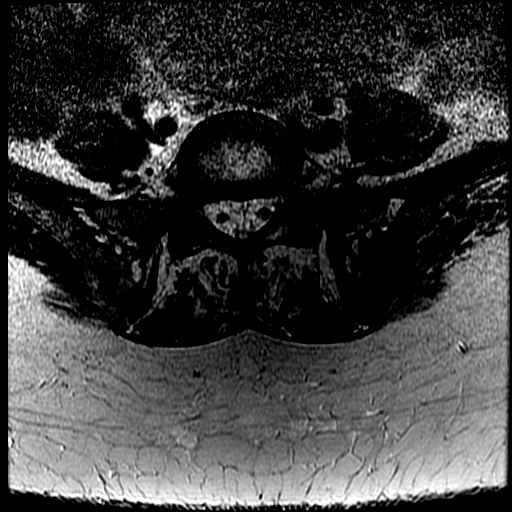
[im 11/36]
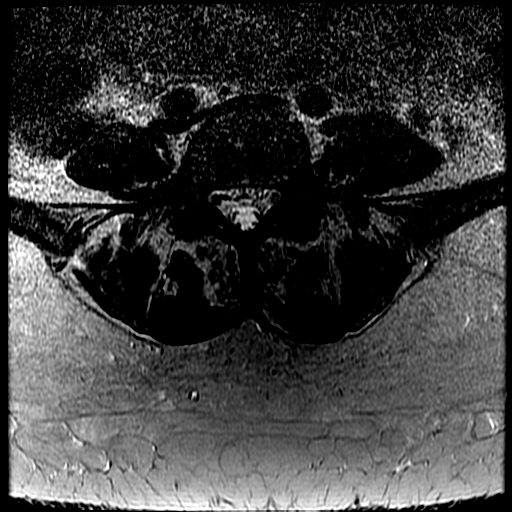
[im 16/36]
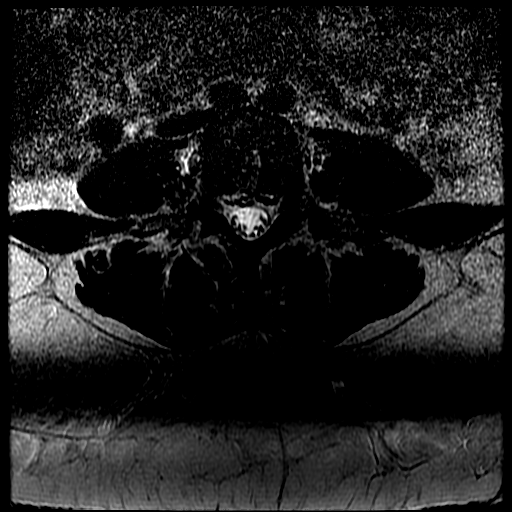
[im 18/36]
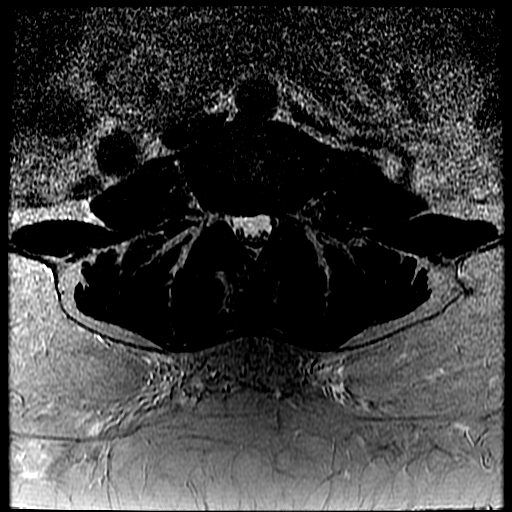
[im 31/36]
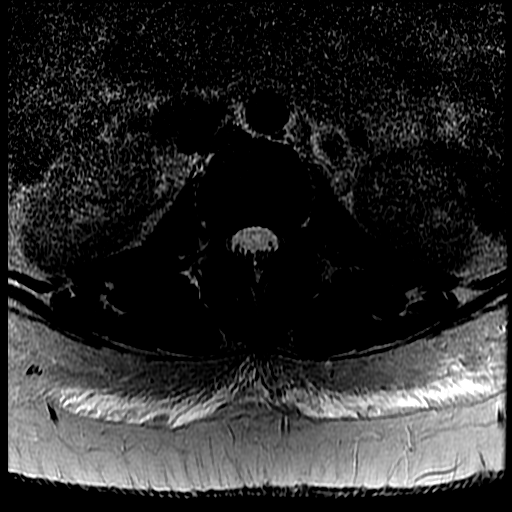

[Series 8: T1 · axial · 4.0mm · 0.39mm/px · z∈[+33,+186]mm · 3 of 36 slices shown (2 of 2)]
[im 6/36]
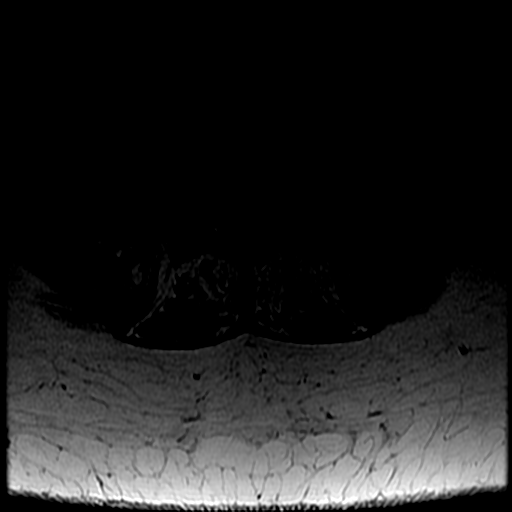
[im 18/36]
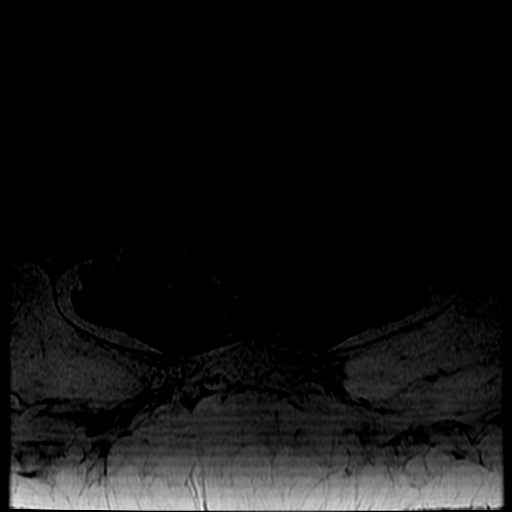
[im 31/36]
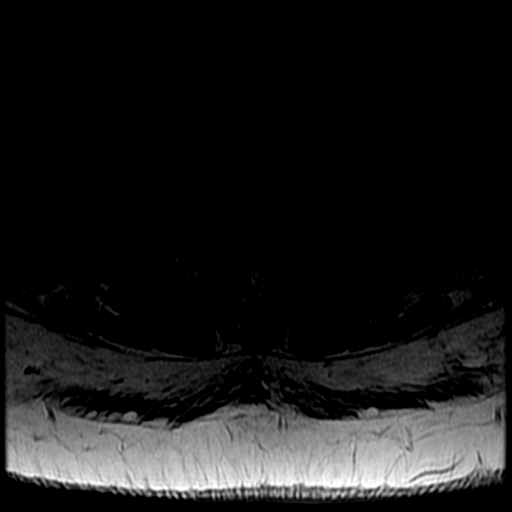

[18 of 48 positions shown; findings below may reference images not displayed]

FINDINGS: Large body habitus. Normal lumbar segmentation demonstrated on the
comparison. Stable, normal vertebral height and alignment. No marrow
edema or evidence of acute osseous abnormality.

Visualized lower thoracic spinal cord is normal with conus medularis
at L1.

Edema in the posterior subcutaneous fat, a common finding in
immobilized patient. Trace pelvic free fluid, likely physiologic.
Postpartum uterus partially visible. Other visualized abdominal
viscera are within normal limits.

T11-T12:  Negative.

T12-L1:  Negative.

L1-L2:  Negative.

L2-L3:  Negative disc.  Mild facet hypertrophy, no stenosis.

L3-L4: Mild disc desiccation. Mild disc bulge. No definite disc
herniation. Mild facet hypertrophy. Mild flattening of the ventral
thecal sac without spinal stenosis or convincing neural impingement.

L4-L5: Disc desiccation. Minor disc bulging. Superimposed small
right paracentral disc protrusion with annular fissure, best seen on
series 7, image 24). Disc in proximity to the descending right L5
nerve roots, but no spinal or lateral recess stenosis. Mild facet
hypertrophy. No foraminal stenosis.

L5-S1: Negative disc. Mild facet hypertrophy greater on the right.
Epidural lipomatosis effacing CSF from the thecal sac at this level.

No epidural process or mass in the visualized lower thoracic or
lumbosacral spine.
IMPRESSION: 1. Negative for lumbar epidural hematoma. No lumbar spinal stenosis.
2. Mild L3-L4 and L4-L5 disc degeneration. Small disc herniation at
the latter directed toward the right. No definite nerve root mass
effect, but still this could be a source for right L5 radiculitis.
3. Intermittent mild to moderate lumbar facet hypertrophy.
4. Postpartum uterus and trace pelvic free fluid.

## 2016-07-19 IMAGING — MR MR THORACIC SPINE W/O CM
4 of 6 series · 22 of 48 positions shown · non-contrast
Comparison: Chest x-ray dated 08/21/2013

CLINICAL DATA: Right leg numbness and inability to raise the right
leg since epidural injection for childbirth 2 days ago. Altered
mental status.

EXAM:
MRI THORACIC SPINE WITHOUT CONTRAST
TECHNIQUE: Multiplanar, multisequence MR imaging of the thoracic spine was
performed. No intravenous contrast was administered.

[Series 2: sag (id) · sagittal · 3.0mm · 1.88mm/px · 4 of 12 slices shown]
[im 1/12]
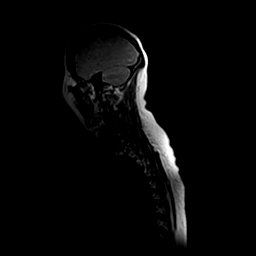
[im 4/12]
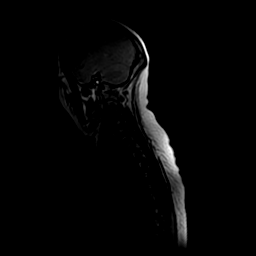
[im 8/12]
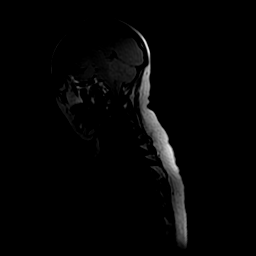
[im 12/12]
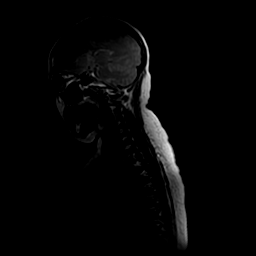

[Series 5: T1 · sagittal · 3.0mm · 0.64mm/px · 4 of 12 slices shown]
[im 1/12]
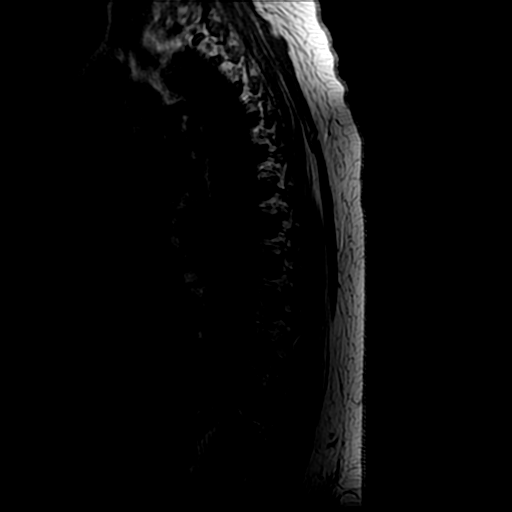
[im 4/12]
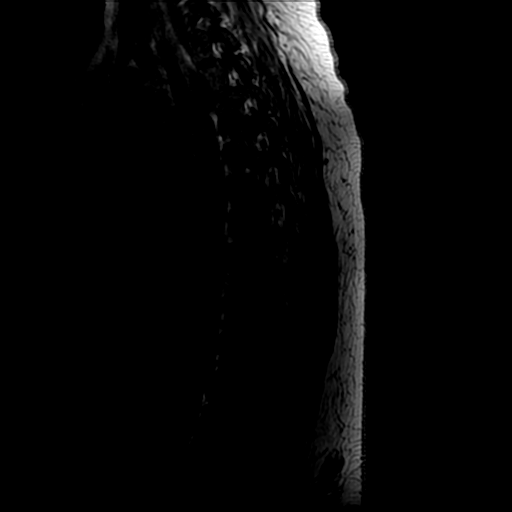
[im 8/12]
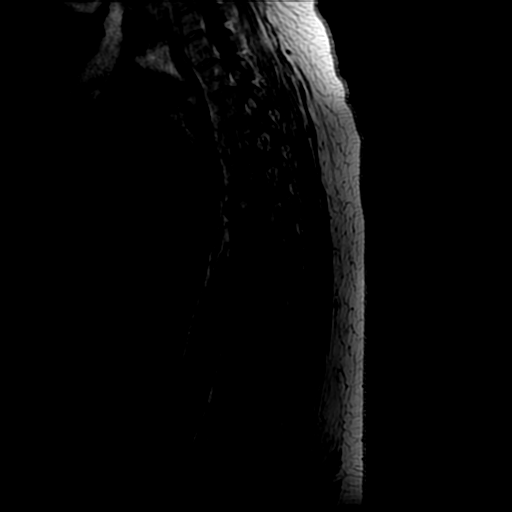
[im 12/12]
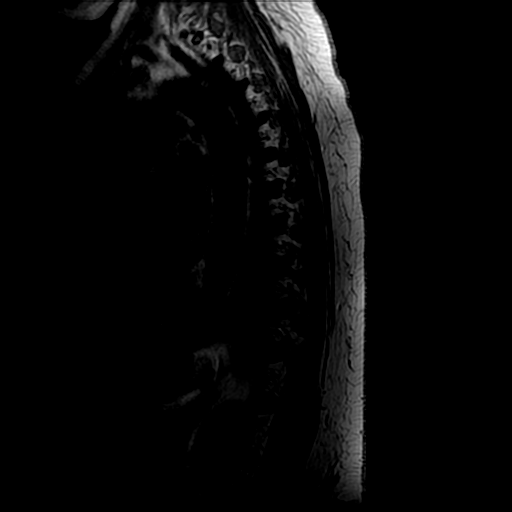

[Series 6: T2 · sagittal · 3.0mm · 0.64mm/px · 4 of 12 slices shown (1 of 2)]
[im 1/12]
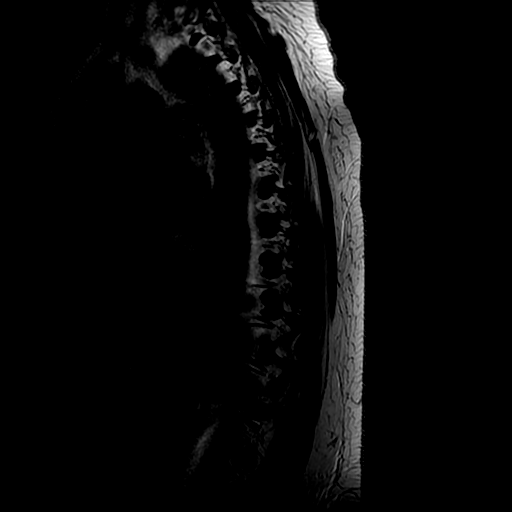
[im 4/12]
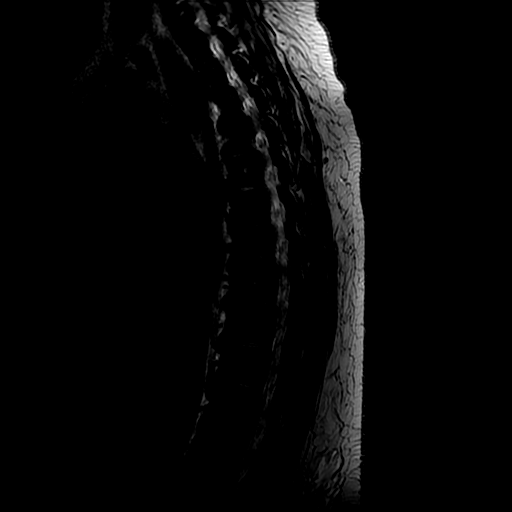
[im 8/12]
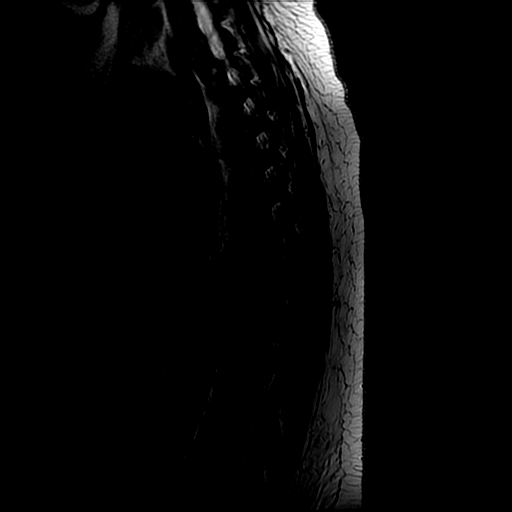
[im 12/12]
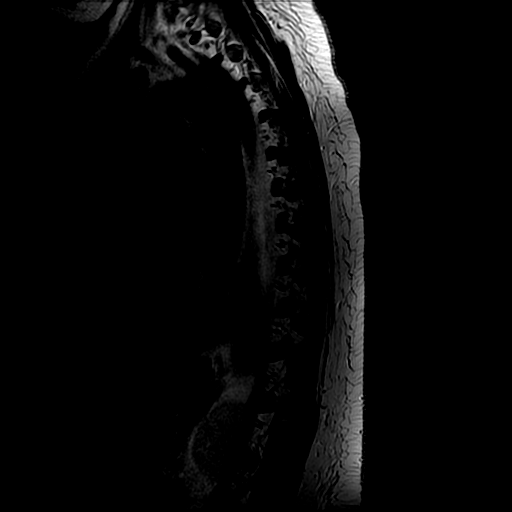

[Series 9: T2 · axial · 4.0mm · 0.39mm/px · z∈[-291,-65]mm · 10 of 49 slices shown (2 of 2)]
[im 4/49]
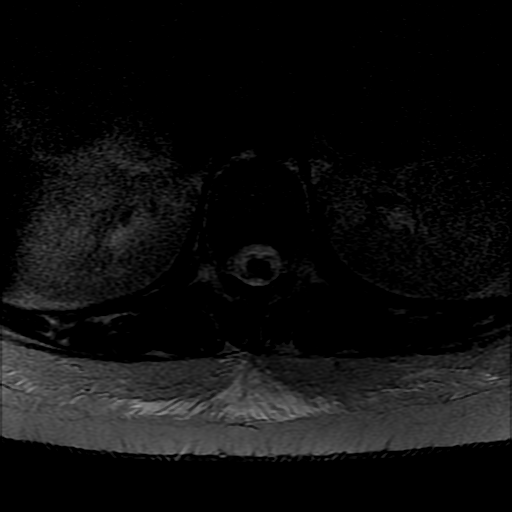
[im 7/49]
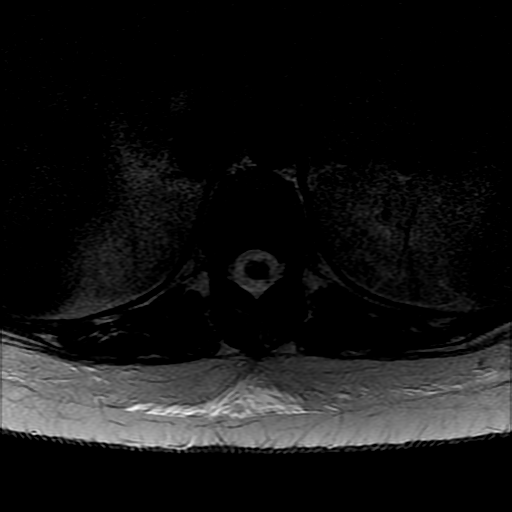
[im 10/49]
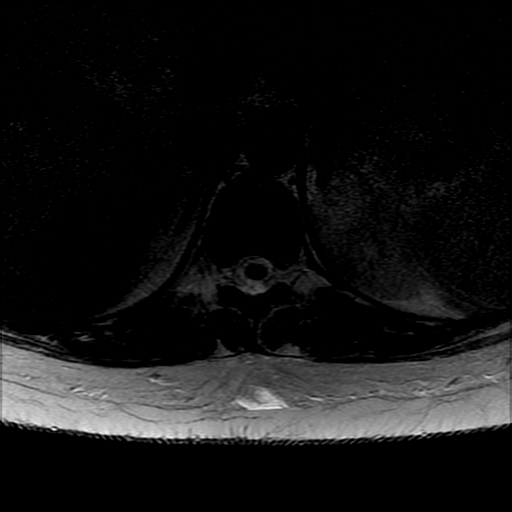
[im 17/49]
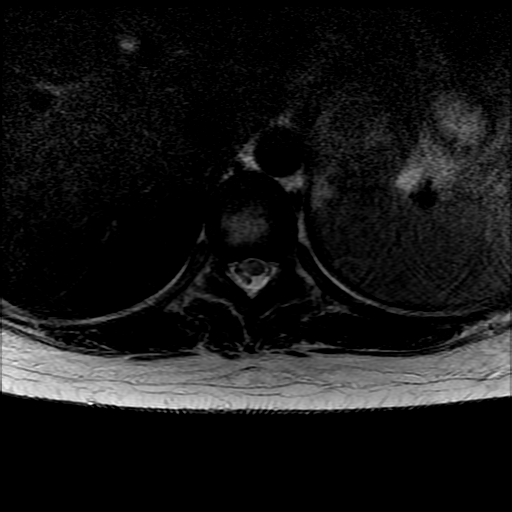
[im 23/49]
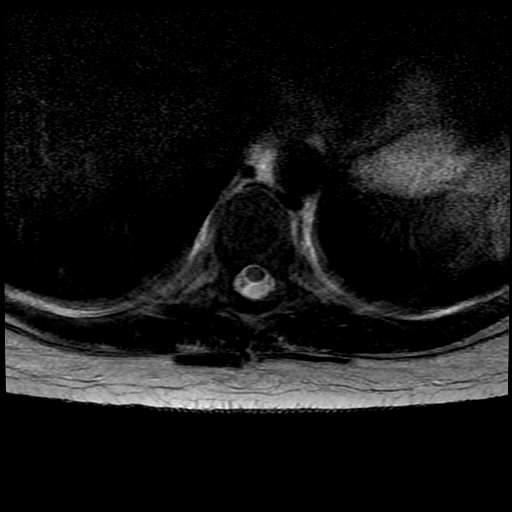
[im 26/49]
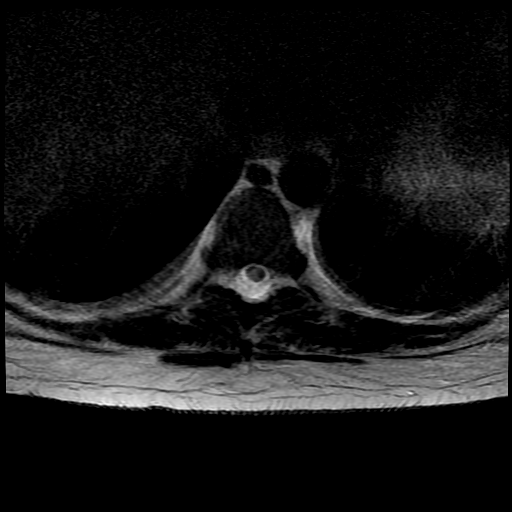
[im 29/49]
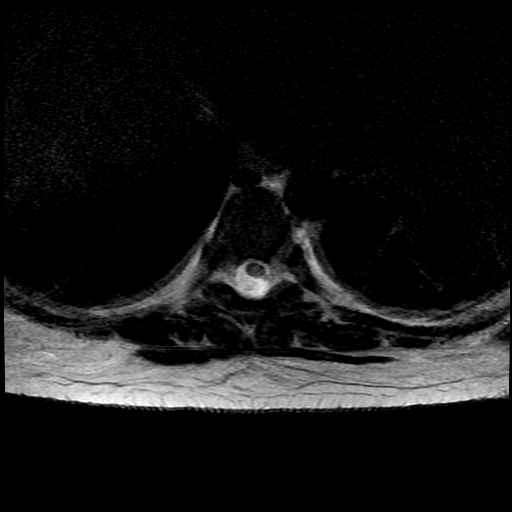
[im 36/49]
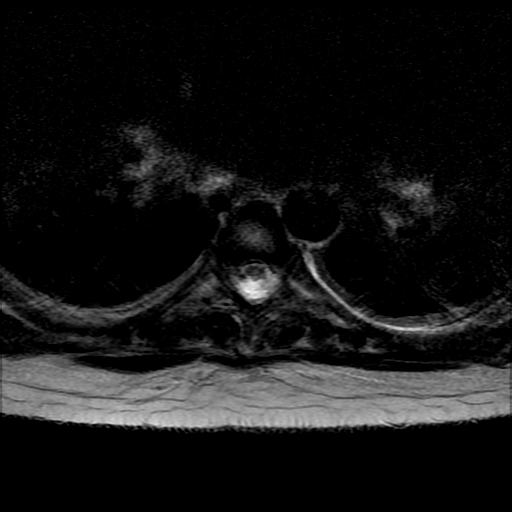
[im 42/49]
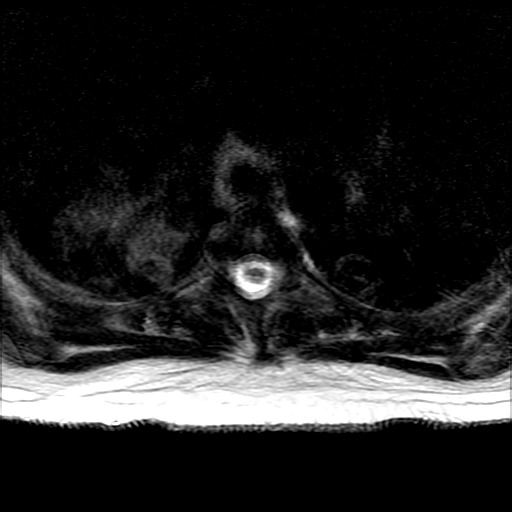
[im 49/49]
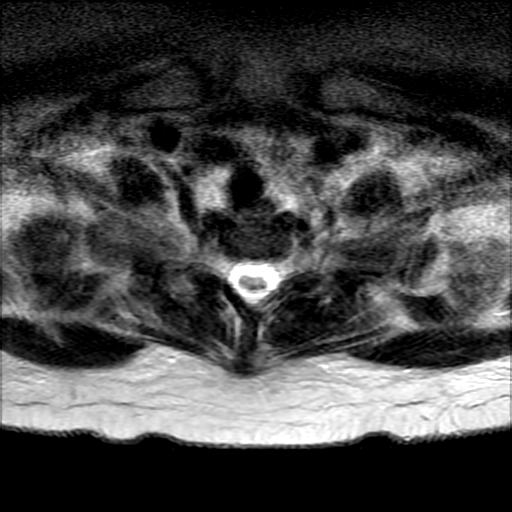

[22 of 48 positions shown; findings below may reference images not displayed]

FINDINGS: The thoracic spinal cord is normal with no mass lesion or myelopathy
or spinal cord compression. Conus tip is at L1. The discs throughout
the thoracic spine are normal. No spinal or foraminal stenosis.
Osseous structures are normal except for tiny hemangiomas in the T6
and T7.

There is no epidural fluid collection.

Note is made of a small area of patchy infiltrate in the right upper
lobe on image number 8 of series 9.
IMPRESSION: 1. Normal MRI of the thoracic spine.
2. Small patchy area of infiltrate in the right upper lobe.

## 2016-07-19 IMAGING — MR MR HEAD W/O CM
8 of 10 series · 37 of 48 positions shown · non-contrast
Comparison: CT head 07/27/2014.

CLINICAL DATA: Altered mental status.  Possible PRES.

EXAM:
MRI HEAD WITHOUT CONTRAST
TECHNIQUE: Multiplanar, multiecho pulse sequences of the brain and surrounding
structures were obtained without intravenous contrast.

[Series 3: T1 · sagittal · 5.0mm · 0.47mm/px · 3 of 24 slices shown]
[im 1/24]
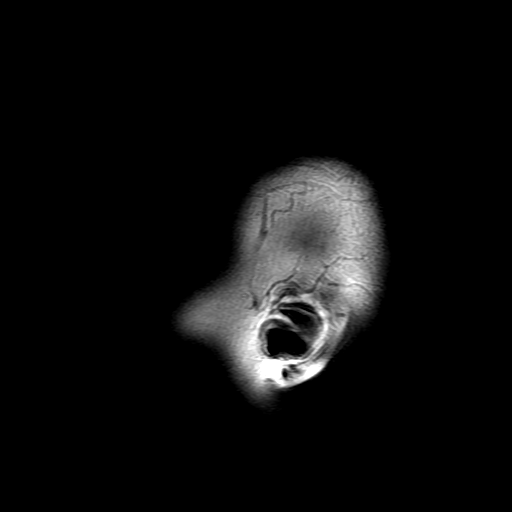
[im 8/24]
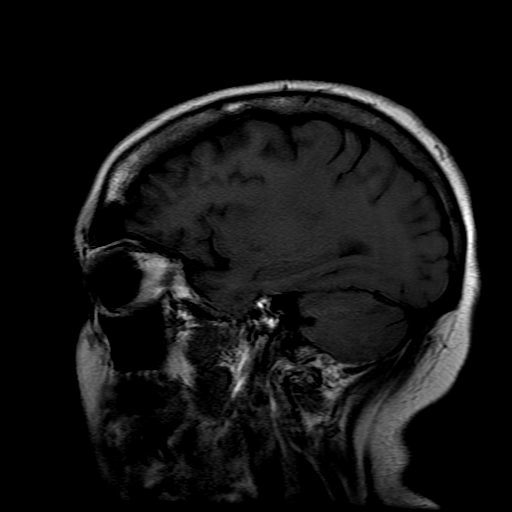
[im 16/24]
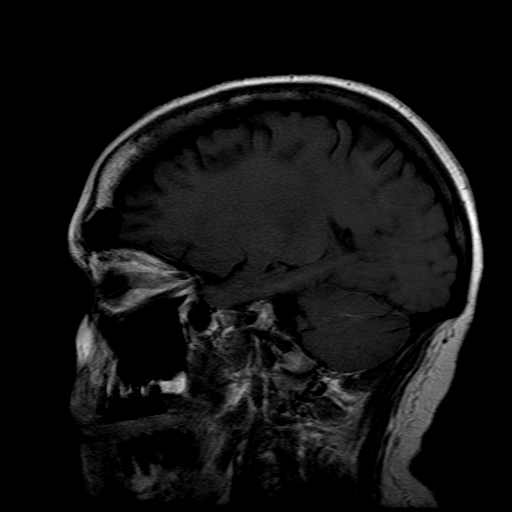

[Series 4: DWI · axial · 5.0mm · 1.09mm/px · z∈[-72,+90]mm · 9 of 68 slices shown (1 of 4)]
[im 1/68]
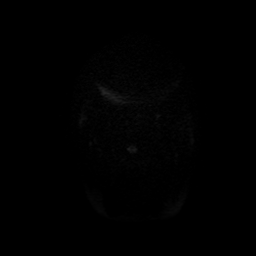
[im 9/68]
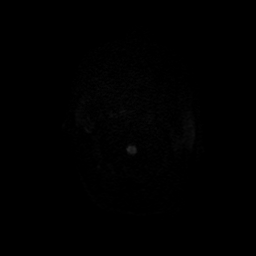
[im 17/68]
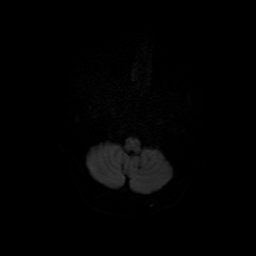
[im 26/68]
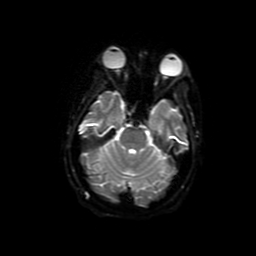
[im 34/68]
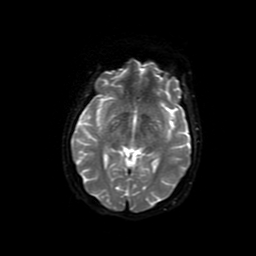
[im 42/68]
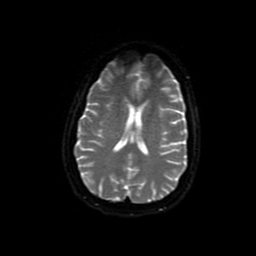
[im 51/68]
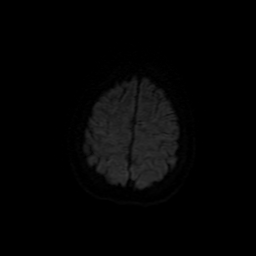
[im 59/68]
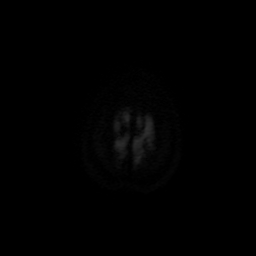
[im 68/68]
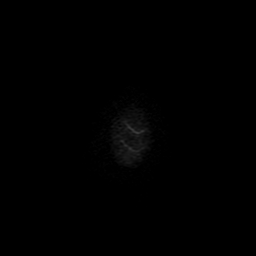

[Series 5: DWI · coronal · 5.0mm · 1.09mm/px · 8 of 68 slices shown (2 of 4)]
[im 1/68]
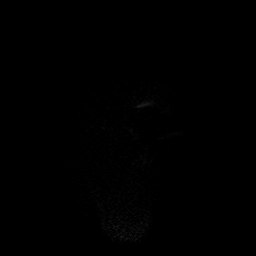
[im 10/68]
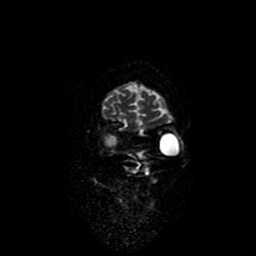
[im 20/68]
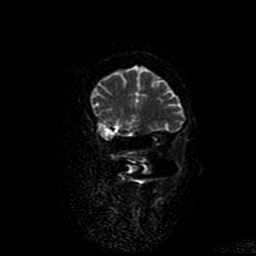
[im 29/68]
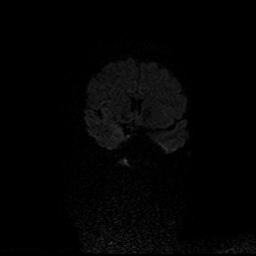
[im 39/68]
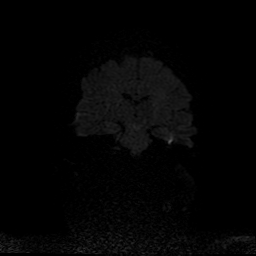
[im 48/68]
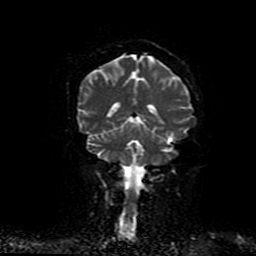
[im 58/68]
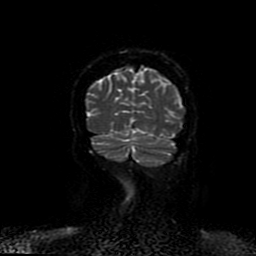
[im 68/68]
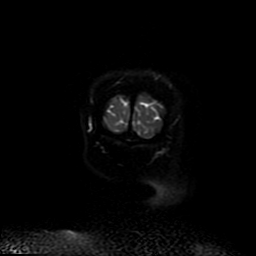

[Series 6: T2 · axial · 5.0mm · 0.43mm/px · z∈[-76,+73]mm · 3 of 27 slices shown (1 of 2)]
[im 1/27]
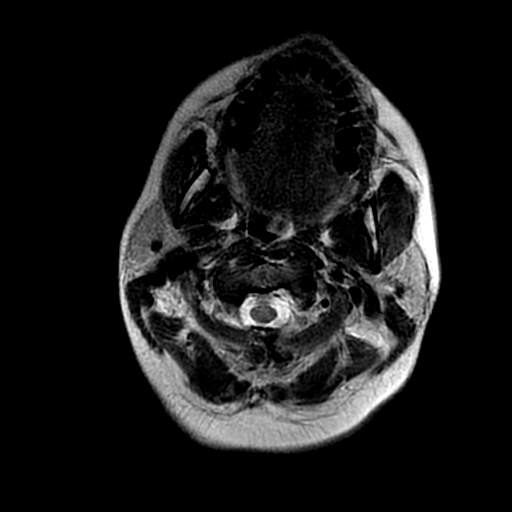
[im 14/27]
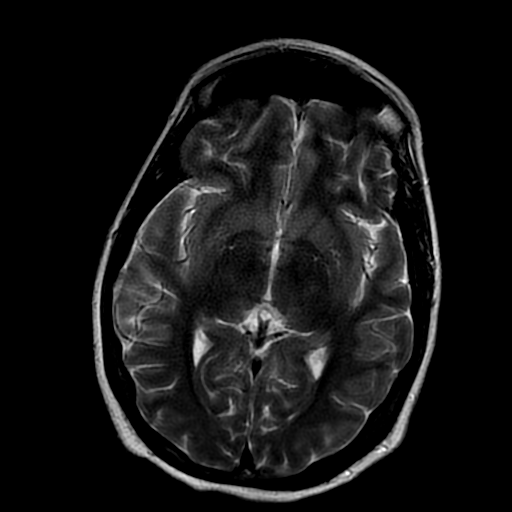
[im 27/27]
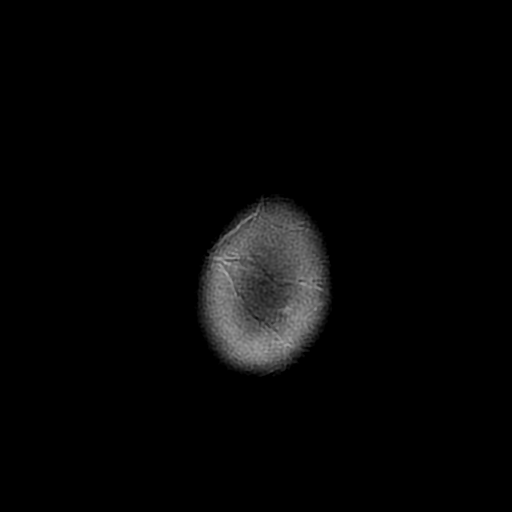

[Series 7: FLAIR · axial · 5.0mm · 0.43mm/px · z∈[-76,+73]mm · 3 of 27 slices shown]
[im 1/27]
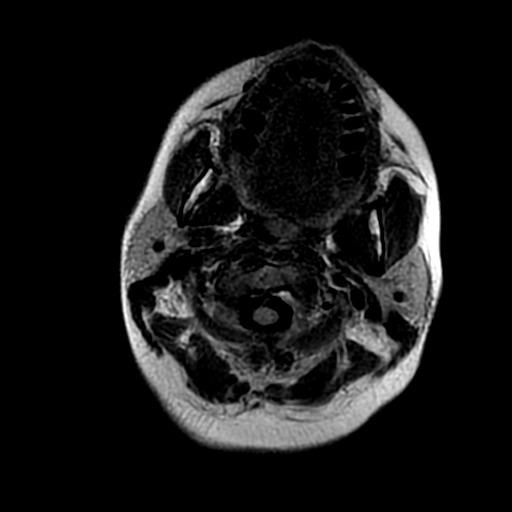
[im 14/27]
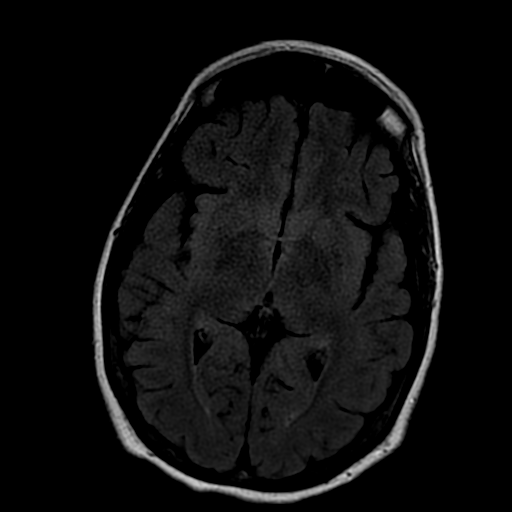
[im 27/27]
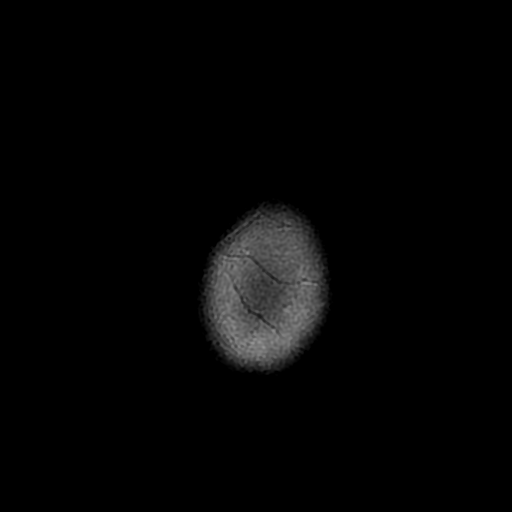

[Series 10: T2 · coronal · 5.0mm · 0.45mm/px · 3 of 27 slices shown (2 of 2)]
[im 1/27]
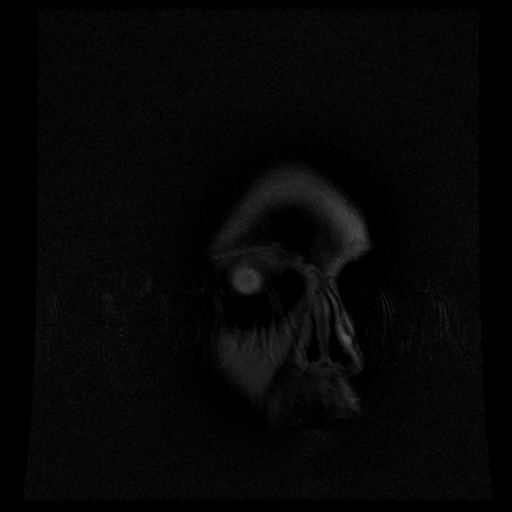
[im 14/27]
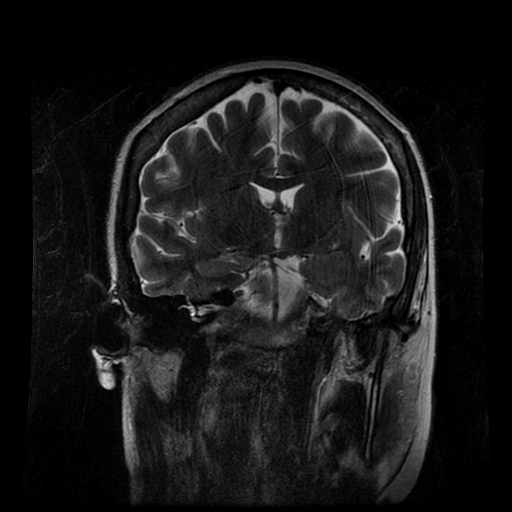
[im 27/27]
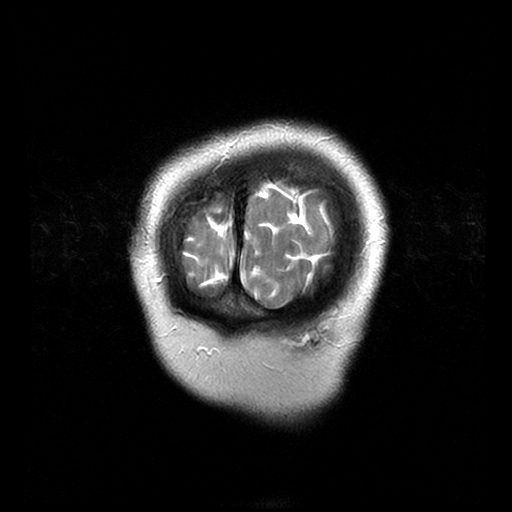

[Series 400: DWI · axial · 5.0mm · 1.09mm/px · z∈[-72,+90]mm · 4 of 34 slices shown (3 of 4)]
[im 1/34]
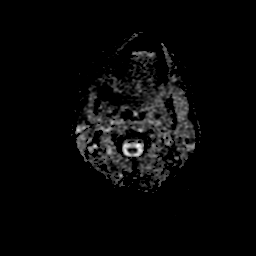
[im 12/34]
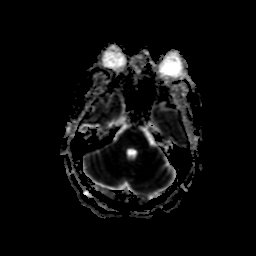
[im 23/34]
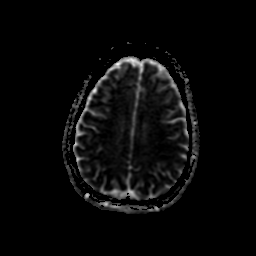
[im 34/34]
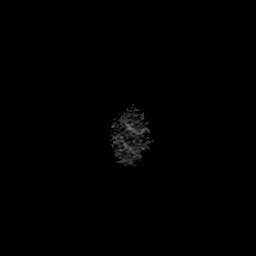

[Series 500: DWI · coronal · 5.0mm · 1.09mm/px · 4 of 34 slices shown (4 of 4)]
[im 1/34]
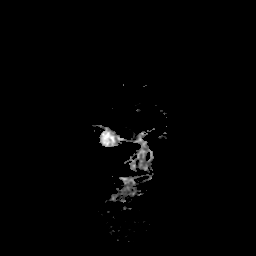
[im 12/34]
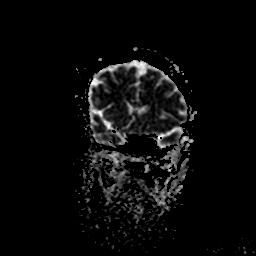
[im 23/34]
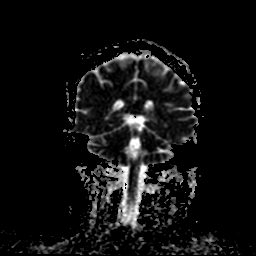
[im 34/34]
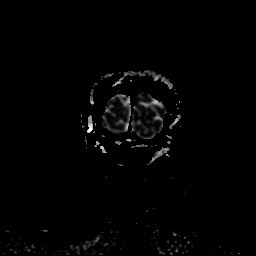

[37 of 48 positions shown; findings below may reference images not displayed]

FINDINGS: The patient was unable to remain motionless for the exam. Small or
subtle lesions could be overlooked.

No evidence for acute infarction, hemorrhage, mass lesion,
hydrocephalus, or extra-axial fluid. Normal cerebral volume. No
white matter disease. No foci of chronic hemorrhage. Flow voids are
maintained. No midline abnormality. Extracranial soft tissues
unremarkable.
IMPRESSION: Negative exam within limits of evaluation for motion degradation. No
acute intracranial findings are evident.

## 2016-07-20 ENCOUNTER — Encounter: Payer: Self-pay | Admitting: Family Medicine

## 2016-07-20 ENCOUNTER — Telehealth: Payer: Self-pay | Admitting: Family Medicine

## 2016-07-20 ENCOUNTER — Encounter (HOSPITAL_COMMUNITY): Payer: Self-pay | Admitting: Psychiatry

## 2016-07-20 NOTE — Telephone Encounter (Signed)
Last OV note does not mention urinary retention  Could you do an addendum so that I may send with referral to urology   Please advise

## 2016-07-21 ENCOUNTER — Other Ambulatory Visit: Payer: Self-pay | Admitting: Family Medicine

## 2016-07-21 NOTE — Telephone Encounter (Signed)
#  1 I went back and added an addendum to the office visit from last week. That way referral to urology can be done #2 as for her pain medicine. She had an abnormal urine drug screen. I will no longer be prescribing her pain medicines. I told her this at her office visit. I also told her that it can take a while to get in with pain management. I also told her that we cannot guarantee that pain management will take her.(The fact that she will run out of her medication is on her not on [name]) I will not be prescribing further pain medicine. She will just have to tough it out until she can get in with pain management. Unfortunately many pain management places are not taking new patients. If you have further difficulties I would recommend discussing with the nurses to have them help with intervention. Let me know if any other issues.

## 2016-07-21 NOTE — Telephone Encounter (Signed)
Pt called to check on Urology referral, explained was waiting for further documentation to send with referral  Also, working on pain management referral.  Explained to patient that [facility] - Dr. Selinda Orion has reviewed her case and has declined to take her as a patient due to no treatment options available that they can offer, explained to patient that we are still working on finding a pain management specialist, I will be referring to Dr. Francesco Runner (hopefully they will accept pt)  Pt verbalized understanding and wanted me to mention that the pain medicine given to her at her last OV will run out 07/28/16 (didn't see anything on her med list)

## 2016-07-22 ENCOUNTER — Telehealth: Payer: Self-pay | Admitting: Family Medicine

## 2016-07-22 ENCOUNTER — Encounter: Payer: Self-pay | Admitting: Family Medicine

## 2016-07-22 NOTE — Telephone Encounter (Signed)
Spoke with patient and informed her per Dr.Scott Luking- Dr.Scott recommends using medications you currently tizanidine as muscle relaxer, tylenol as need, and phenergan for nausea and vomiting. If not helping recommends patient goes to the ER. Patient verbalized understanding.

## 2016-07-22 NOTE — Telephone Encounter (Signed)
Pt calling to say that she has had a migraine for 3 days now and her meds are not working, ringing in her ears and can't keep anything down (more nausea then vomiting, she can't eat anything)   Please advise   Vazquez

## 2016-07-22 NOTE — Telephone Encounter (Signed)
I rec using what she has- tizanidine as muscle relaxer, tylenol prn , phenerga for Nausea and vomiting- if these arent helping ER ( I can not send in pain meds due to issue)

## 2016-07-23 ENCOUNTER — Encounter: Payer: Self-pay | Admitting: Family Medicine

## 2016-07-27 ENCOUNTER — Telehealth: Payer: Self-pay | Admitting: Family Medicine

## 2016-07-27 NOTE — Telephone Encounter (Signed)
(  First of all I have had a long discussion with the patient last time she was here. Her urine drug screen revealed inconsistencies which made such to where we could no longer prescribe pain medication. This is the reason why she is being referred to pain management.) The only medication that we would be able to prescribe would be tramadol for which the patient states that she is had problems/side effects/allergies with. I cannot prescribe any controlled pain medicines. She needs to continue Cymbalta because it does help. She may also consider Tylenol as well.

## 2016-07-27 NOTE — Telephone Encounter (Signed)
FYI - Fax received from [facility], Dr. Earley Favor & Dr. Francesco Runner  Their next available Medicaid appt is November They requested a fax back if that is ok and to schedule  Faxed request back to "Yes, please schedule pt for next avail"

## 2016-07-27 NOTE — Telephone Encounter (Signed)
Patient calling to let Dr. Nicki Reaper know that her appointment with pain management is not until October 13, 2016.  She would like to know what else she needs to do.  She has been in bed for a week.

## 2016-07-27 NOTE — Telephone Encounter (Signed)
Pt is scheduled to see Olivia Mackie, NP @ [facility] @ the [place] location on 10/13/16 @ 1:00 They contacted pt directly to schedule

## 2016-07-28 NOTE — Telephone Encounter (Signed)
Patient called back stating that she tried to get into primary care and no office within 30 miles of here is taking a new patient's until November, or December. Patient states she would like to try Tramadol. (Tramadol is listed as an Allergy which causes seizures). Please advise?

## 2016-07-28 NOTE — Telephone Encounter (Signed)
This patient does have a history of seizures. The package insert/FDA recommendations state anybody who has had a history of seizures should not take tramadol. Therefore I would recommend sticking with Tylenol OTC

## 2016-07-28 NOTE — Telephone Encounter (Signed)
Spoke with patient and informed her per Dr.Scott Luking- you  have a history of seizures. The package insert/FDA recommendations state anybody who has had a history of seizures should not take tramadol. Therefore Dr.Scott would recommend sticking with Tylenol OTC. Patient verbalized understanding and stated that she has currently been taking Tylenol, Ibuprofen and other OTC medication and they do not work for her pain. Patient states that she is not asking for a narcotic but she wants to know what else can be prescribed for her pain. Please advise?

## 2016-07-28 NOTE — Telephone Encounter (Signed)
Spoke with patient and informed her per Dr.Scott Luking- The only medication that we would be able to prescribe would be tramadol for which the patient states that she is had problems/side effects/allergies with. I cannot prescribe any controlled pain medicines. She needs to continue Cymbalta because it does help. She may also consider Tylenol as well. Patient verbalized understanding and stated that she was told that she can get a referral to a new primary care doctor to have medications prescribed. Informed patient that no referral is necessary for a new primary care doctor she may make appointment on her own but if she switches primary care doctors that would mean that she would no longer be under the care of Dr.Scott. Patient verbalized understanding.

## 2016-07-28 NOTE — Telephone Encounter (Signed)
Left message return call 07/28/16

## 2016-07-28 NOTE — Telephone Encounter (Signed)
Spoke with patient and informed her per [place] are  currently on Cymbalta which can help with pain. Other options that are typically used for nerve related pain is gabapentin and Lyrica both of those have been listed on your medicine list as allergies. Patient verbalized understanding.

## 2016-07-28 NOTE — Telephone Encounter (Signed)
She is currently on Cymbalta which can help with pain. Other options that are typically used for nerve related pain is gabapentin and Lyrica both of those have been listed on her medicine list as allergies

## 2016-07-29 ENCOUNTER — Telehealth (HOSPITAL_COMMUNITY): Payer: Self-pay | Admitting: *Deleted

## 2016-07-29 NOTE — Telephone Encounter (Signed)
No meds until appt

## 2016-07-29 NOTE — Telephone Encounter (Signed)
Thank you :)

## 2016-07-29 NOTE — Telephone Encounter (Signed)
Called pt and informed her with what provider stated. Pt stated alright and verbalized understanding.

## 2016-07-29 NOTE — Telephone Encounter (Signed)
She will need to come in 

## 2016-07-29 NOTE — Telephone Encounter (Signed)
phone call from patient, she said the two medications she is on is not working.  Buspar and Belsomra.   The panic attacks and anxiety is getting really bad.

## 2016-07-29 NOTE — Telephone Encounter (Signed)
Called pt to sch sooner appt. Pt is scheduled for Sept 1 and pt verbalized understanding with appt. Per pt, so is Dr. Harrington Challenger not going to do anything. Informed pt that if it is any changes in any medications, provider wants to see the patient. Per pt, she's been on the Klonopin before and is not on any pain meds at all and if Dr. Harrington Challenger could put her back on that. Per pt, she just need enough medication to hold her over until her appt with Dr. Harrington Challenger on Sept 1. Pt number is [phone].

## 2016-07-29 NOTE — Telephone Encounter (Signed)
Dr. Nicki Reaper - FYI  Pt's appt with pain management was moved up  It's now 09/29/2016 @ 10:30am with Dr. Francesco Runner

## 2016-07-29 NOTE — Telephone Encounter (Signed)
Dr. Nicki Reaper - FYI   Pt's appt has been moved up again Now it's 08/19/2016 @ 10:30 w/Crystal Edison Pace @ [facility] [place]

## 2016-08-02 IMAGING — MR MR HEAD W/O CM
10 of 11 series · 31 of 48 positions shown · non-contrast
Comparison: CT of the head August 13, 2014 and MRI of the brain
July 31, 2014

CLINICAL DATA: Assess for stroke. Two weeks postpartum for
preeclampsia. Past history of complicated migraine, depression,
irritable bowel syndrome, anxiety and gastric bypass.

EXAM:
MRI HEAD WITHOUT CONTRAST
TECHNIQUE: Multiplanar, multiecho pulse sequences of the brain and surrounding
structures were obtained without intravenous contrast.

[Series 3: DWI · axial · 5.0mm · 1.02mm/px · z∈[-134,-9]mm · 6 of 54 slices shown (1 of 4)]
[im 1/54]
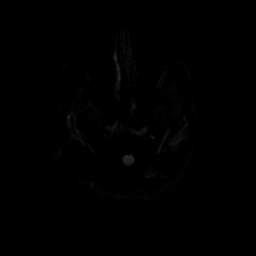
[im 11/54]
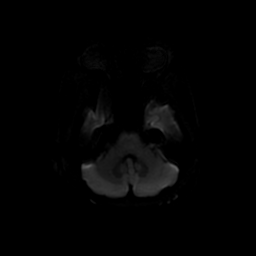
[im 22/54]
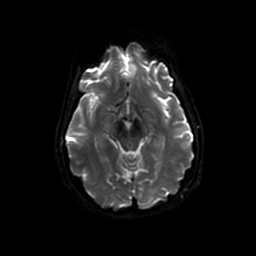
[im 32/54]
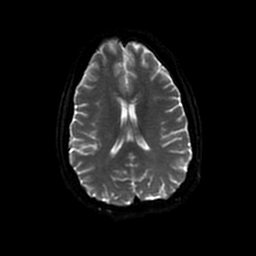
[im 43/54]
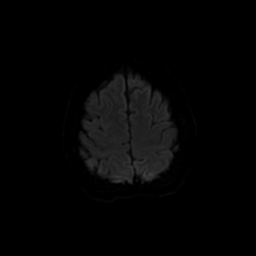
[im 54/54]
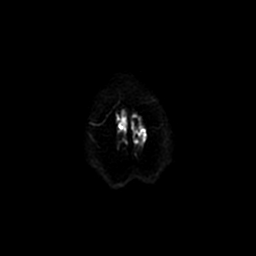

[Series 4: FLAIR · sagittal · 5.0mm · 0.47mm/px · 2 of 19 slices shown (1 of 2)]
[im 1/19]
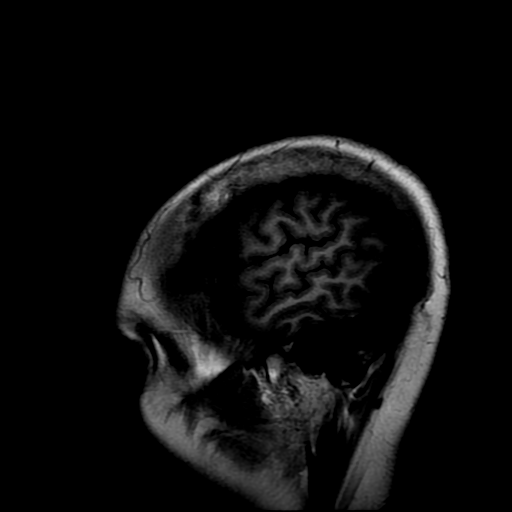
[im 19/19]
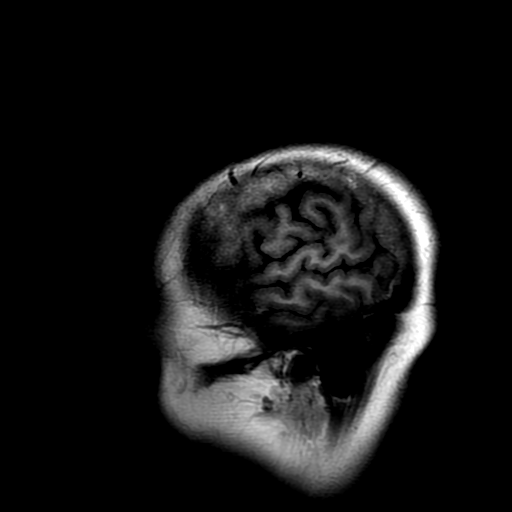

[Series 5: T2 · axial · 5.0mm · 0.43mm/px · z∈[-129,-2]mm · 2 of 23 slices shown (1 of 3)]
[im 1/23]
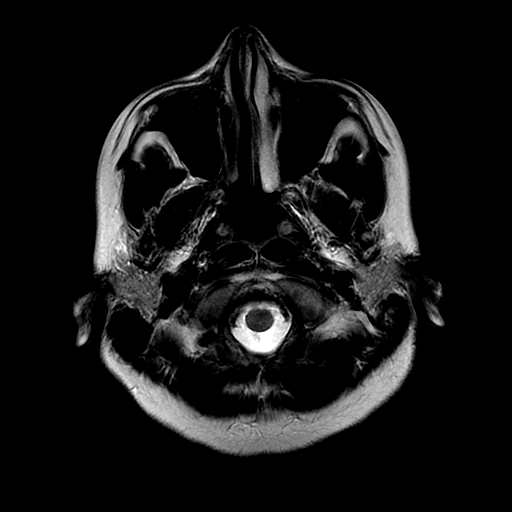
[im 23/23]
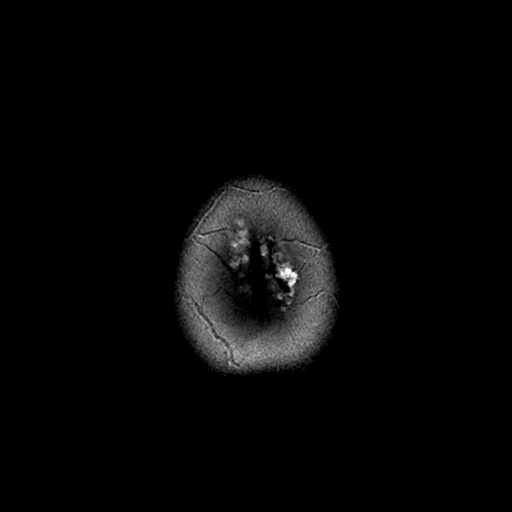

[Series 6: FLAIR · axial · 5.0mm · 0.43mm/px · z∈[-129,-2]mm · 2 of 23 slices shown (2 of 2)]
[im 1/23]
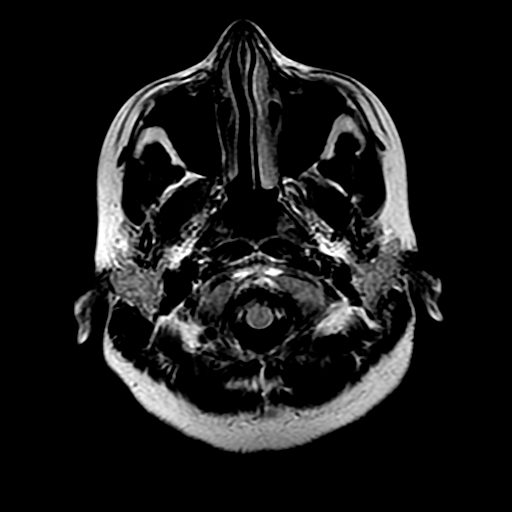
[im 23/23]
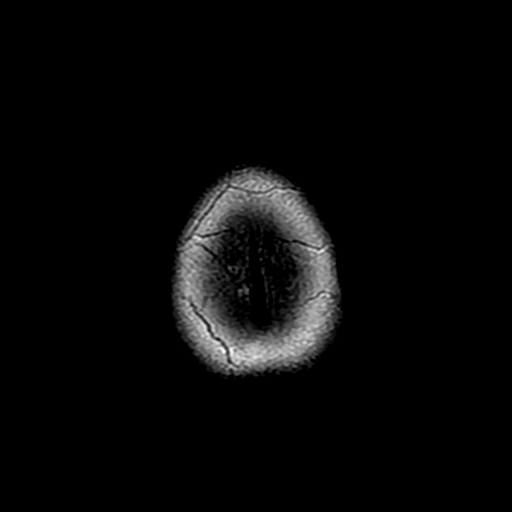

[Series 7: T2 · coronal · 3.5mm · 0.35mm/px · 2 of 23 slices shown (2 of 3)]
[im 1/23]
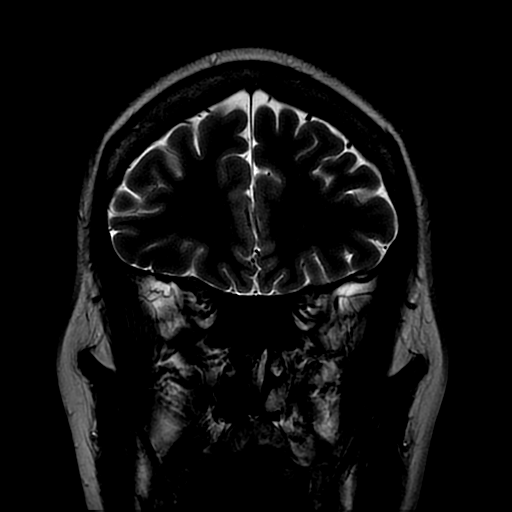
[im 23/23]
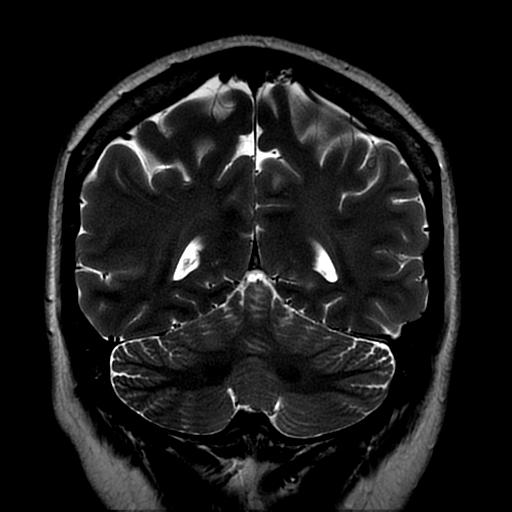

[Series 8: DWI · coronal · 5.0mm · 1.02mm/px · 6 of 68 slices shown (2 of 4)]
[im 1/68]
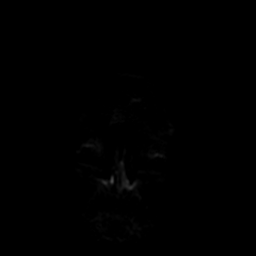
[im 14/68]
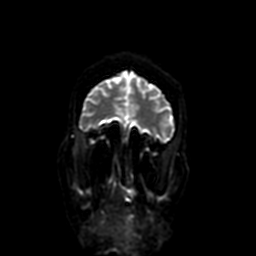
[im 27/68]
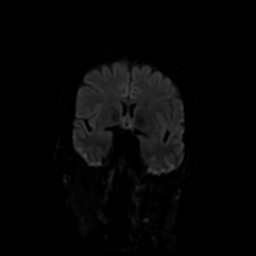
[im 41/68]
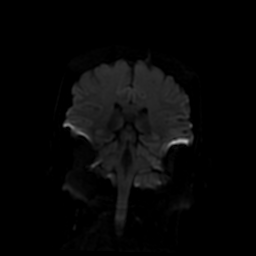
[im 54/68]
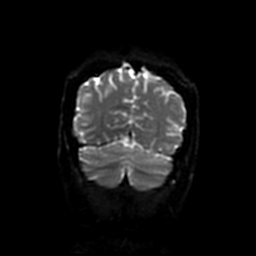
[im 68/68]
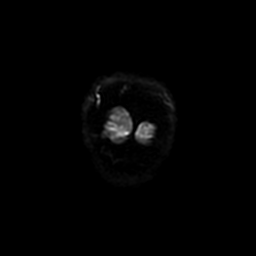

[Series 9: (person_name) · axial · 3.6mm · 0.47mm/px · z∈[-135,-93]mm · 3 of 160 slices shown]
[im 1/160]
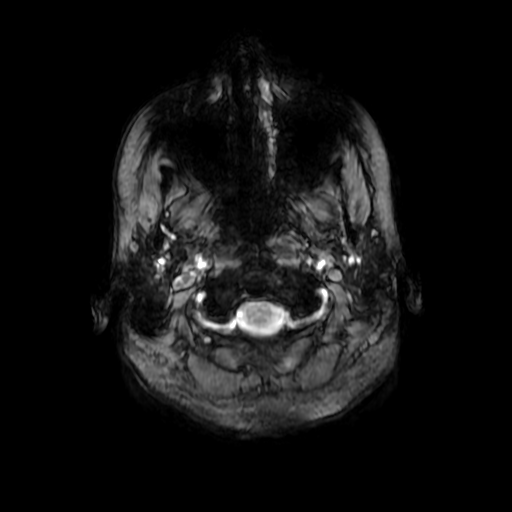
[im 25/160]
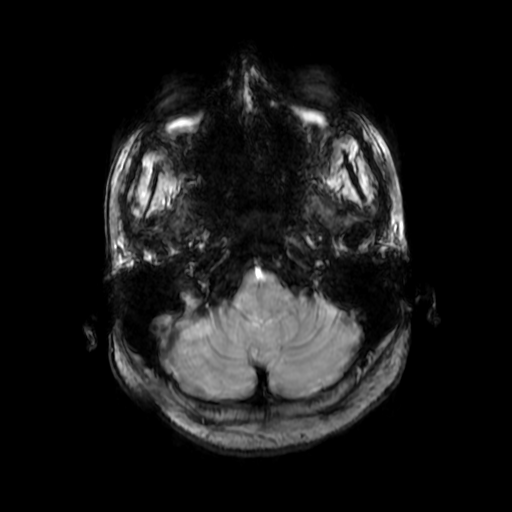
[im 49/160]
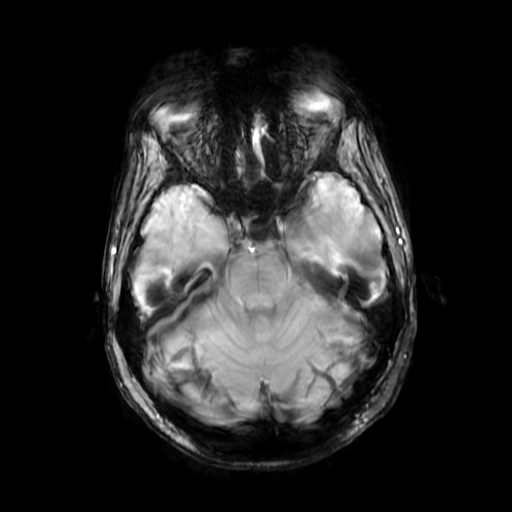

[Series 11: T2 · coronal · 5.0mm · 0.47mm/px · 3 of 29 slices shown (3 of 3)]
[im 1/29]
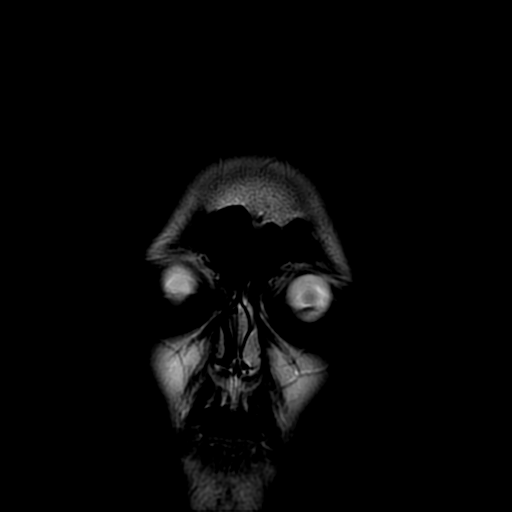
[im 15/29]
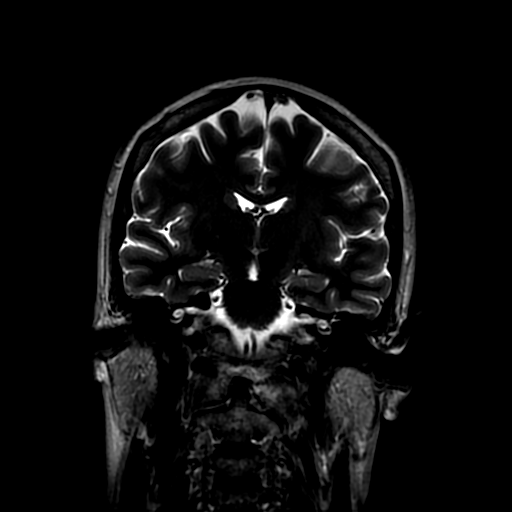
[im 29/29]
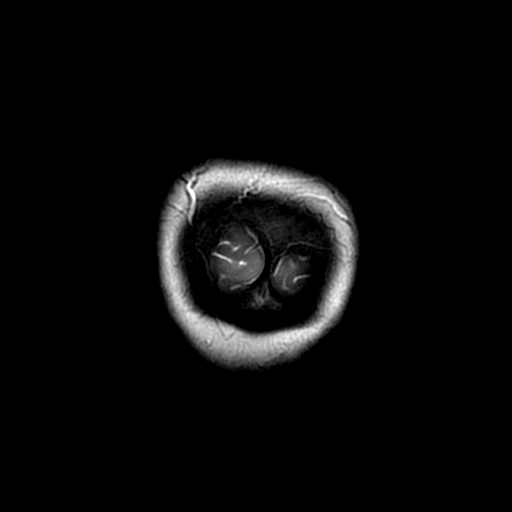

[Series 300: DWI · axial · 5.0mm · 1.02mm/px · z∈[-134,-9]mm · 2 of 27 slices shown (3 of 4)]
[im 1/27]
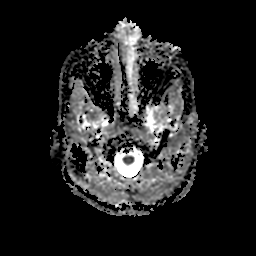
[im 27/27]
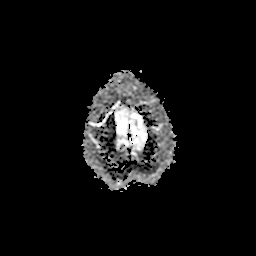

[Series 800: DWI · coronal · 5.0mm · 1.02mm/px · 3 of 34 slices shown (4 of 4)]
[im 1/34]
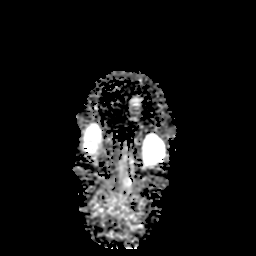
[im 17/34]
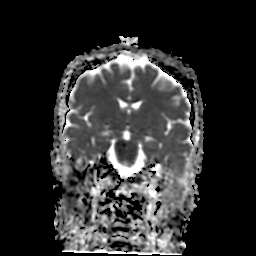
[im 34/34]
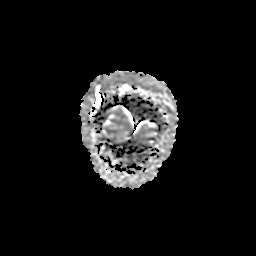

[31 of 48 positions shown; findings below may reference images not displayed]

FINDINGS: The ventricles and sulci are normal. No abnormal parenchymal signal,
mass lesions or mass of affect. No reduced diffusion to suggest
acute ischemia. No susceptibility artifact to suggest hemorrhage.

No abnormal extra-axial fluid collection. No abnormal calvarial bone
marrow signal. Ocular globes and orbital contents are unremarkable
though not tailored for evaluation. Visualized paranasal sinuses and
mastoid air cells appear well-aerated. No abnormal sellar expansion.
Craniocervical junction is maintained.
IMPRESSION: No acute ischemia.  Normal MRI of the head without contrast.

  By: Gergs Dwa

## 2016-08-03 ENCOUNTER — Ambulatory Visit (HOSPITAL_COMMUNITY)
Admission: RE | Admit: 2016-08-03 | Discharge: 2016-08-03 | Disposition: A | Discharge status: Home / Self Care | Payer: Medicaid Other | Source: Ambulatory Visit | Attending: Family Medicine | Admitting: Family Medicine

## 2016-08-03 ENCOUNTER — Ambulatory Visit (INDEPENDENT_AMBULATORY_CARE_PROVIDER_SITE_OTHER): Payer: Medicaid Other | Admitting: Family Medicine

## 2016-08-03 ENCOUNTER — Encounter: Payer: Self-pay | Admitting: Family Medicine

## 2016-08-03 VITALS — BP 108/70 | Ht 66.0 in | Wt 214.4 lb

## 2016-08-03 DIAGNOSIS — R937 Abnormal findings on diagnostic imaging of other parts of musculoskeletal system: Secondary | ICD-10-CM | POA: Diagnosis not present

## 2016-08-03 DIAGNOSIS — M25572 Pain in left ankle and joints of left foot: Secondary | ICD-10-CM

## 2016-08-03 DIAGNOSIS — M25562 Pain in left knee: Secondary | ICD-10-CM

## 2016-08-03 DIAGNOSIS — M79662 Pain in left lower leg: Secondary | ICD-10-CM | POA: Insufficient documentation

## 2016-08-03 MED ORDER — HYDROCODONE-ACETAMINOPHEN 10-325 MG PO TABS: 1.0000 | ORAL_TABLET | Freq: Four times a day (QID) | ORAL | 0 refills | Status: DC | PRN

## 2016-08-03 NOTE — Progress Notes (Addendum)
   Subjective:    Patient ID: Penny [name], female    DOB: 1986/05/15, 30 y.o.   MRN: [id number]  Fall  The accident occurred 3 to 5 days ago. The fall occurred while walking. There was no blood loss. The point of impact was the head. She has tried acetaminophen and NSAID for the symptoms.  Patient relates her lower legs became numb left leg became weak and she fell striking her left lower leg on seen meant it went up underneath her. She also struck her head but did not have any loss of consciousness no nausea or vomiting  Patient states no other concerns this visit.   Review of Systems     Objective:   Physical Exam The patient has subjective numbness in the left arm. I don't find any evidence of any type of damage to the left arm. She states she can still use it. Her upper back has mild to moderate tenderness.  Left knee abrasion with bruising noted mid lower leg mild tenderness ankle mild tenderness proximal part of the foot along the lateral aspect of the angle ankle mild tenderness    Assessment and plan Contusions injury X-rays indicated Short prescription of narcotics given Patient to follow-up if ongoing troubles  With this particular patient she will be seen pain management in the near future. I believe this patient does in fact have significant pain from her neuropathy but I do not feel comfortable being her ongoing pain management physician. What makes her situation complicated is she has tried other measures including Cymbalta and Neurontin Lyrica but had side effects. She would benefit from a close relationship with pain management  Patient also has psychiatry that she sees. She was encouraged to keep these visits     Face-to-face visit done. Patient does have significant neurologic and orthopedic issues. She would benefit from a ASO brace. She will be seen specialist to get this. It is medically necessary to help prevent falls and injury.

## 2016-08-04 ENCOUNTER — Telehealth: Payer: Self-pay | Admitting: Family Medicine

## 2016-08-04 NOTE — Telephone Encounter (Signed)
Patient requesting results of x-rays she done yesterday.

## 2016-08-04 NOTE — Telephone Encounter (Signed)
Patient notified that all x rays were negative. Patient verbalized understanding and wants to know who she needs to contact about her stabbing severe stabbing pain. Patient has appt with pain management 08/19/16 but is is a lot of pain now.

## 2016-08-04 NOTE — Telephone Encounter (Addendum)
See result notes- all x rays were normal

## 2016-08-04 NOTE — Telephone Encounter (Signed)
She was given a few pain pills because of the contusions. This is not for ongoing use from our practice. She may use Tylenol when those run out

## 2016-08-04 NOTE — Telephone Encounter (Signed)
Please let the patient know that I did in fact call Medicaid regarding the locked in prescription program that the patient brought to my attention. The official at [facility] stated that when she gets established with pain medicine doctor in September she can have that Dr. placed as the doctor who would be able to write controlled medicines. Medicaid stated that we would not be the doctor on the locked in prescription program since we are not prescribing controlled medications to her. I highly recommend that she call the phone number on the Medicaid locked in program sheet in order to clarify any question she may have about this program. According to Medicaid we can continue being her family physician for routine stuff such as sinus infection blood pressure checks etc. but we would not be able to write any controlled medications. (Private side note-it should be noted that I did discuss this situation with Medicaid officials and inform them that we would not be the doctor on record for controlled medications. I informed Medicaid that our practice will not be prescribing controlled medications for this patient.)

## 2016-08-04 NOTE — Telephone Encounter (Signed)
Patient advised per Dr Nicki Reaper said she was given a few pain pills because of the contusions. This is not for ongoing use from our practice. She may use Tylenol when those run out. Patient verbalized understanding.

## 2016-08-05 IMAGING — CT CT RENAL STONE PROTOCOL
2 of 4 series · 16 of 46 positions shown, 18 images · non-contrast
Comparison: 06/11/2015

CLINICAL DATA: Pain, unable to urinate for 1 week

EXAM:
CT ABDOMEN AND PELVIS WITHOUT CONTRAST
TECHNIQUE: Multidetector CT imaging of the abdomen and pelvis was performed
following the standard protocol without IV contrast.

[Series 2: routine abd pel with · axial · 0.89mm/px · z∈[-482,+24]mm · 13 of 111 slices shown, 15 images]
[im 5/111  soft-tissue]
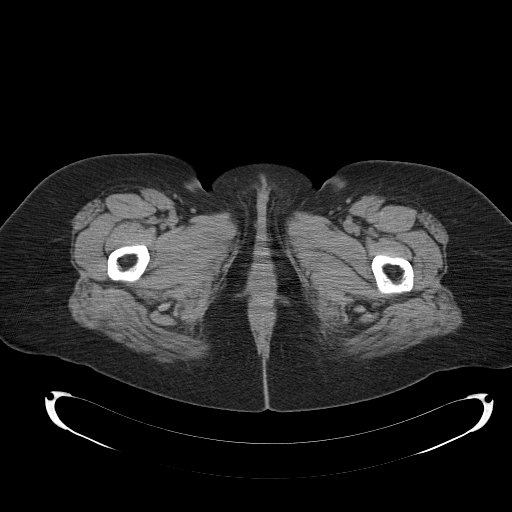
[im 5/111  bone]
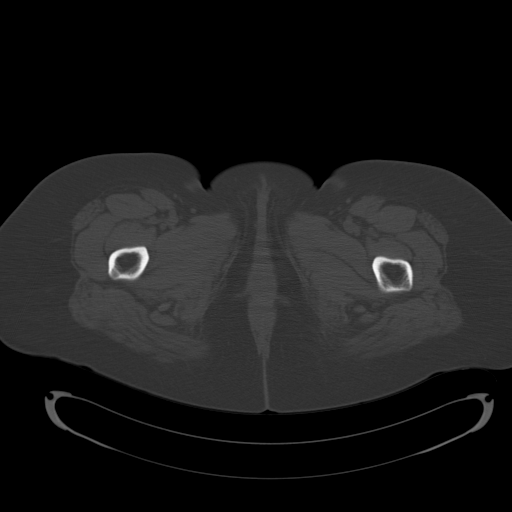
[im 14/111  soft-tissue]
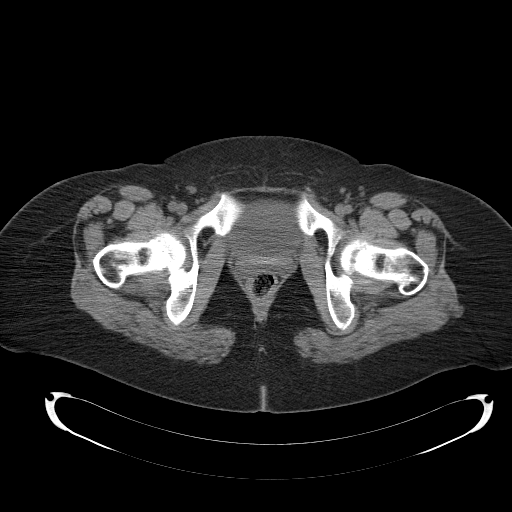
[im 23/111  soft-tissue]
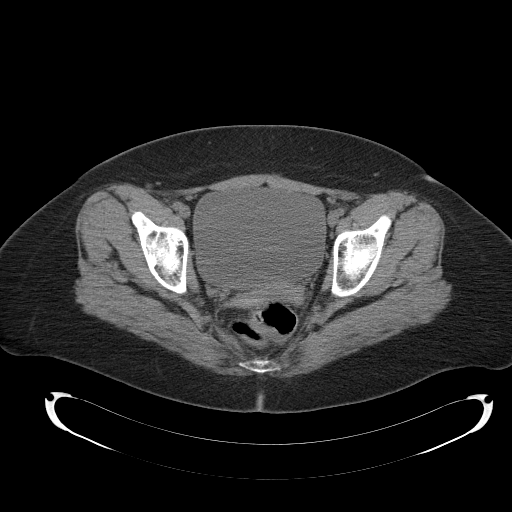
[im 31/111  soft-tissue]
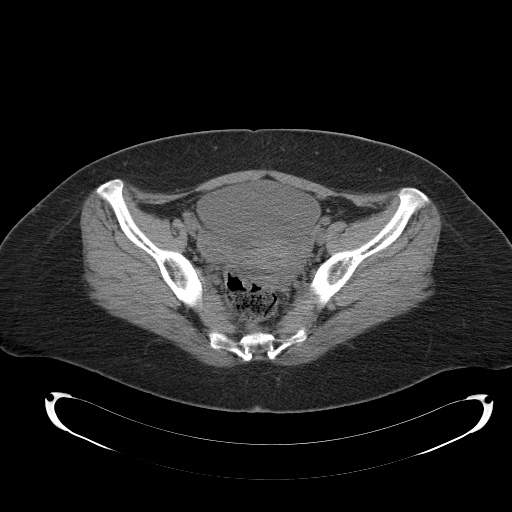
[im 40/111  soft-tissue]
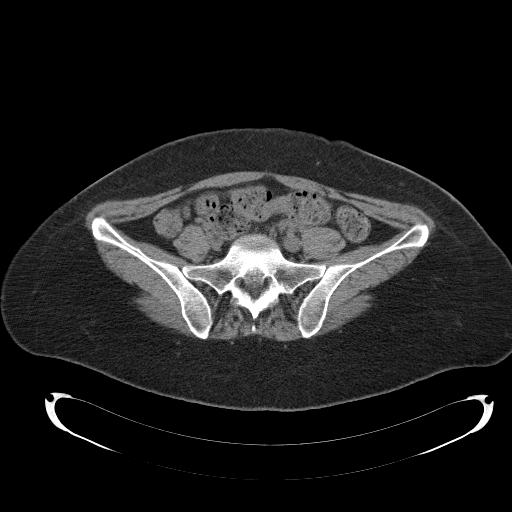
[im 49/111  soft-tissue]
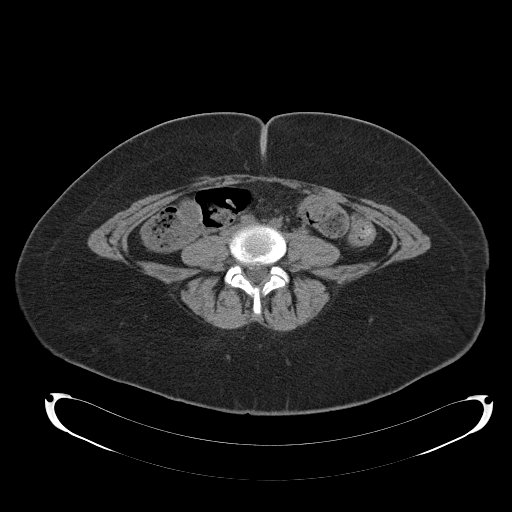
[im 58/111  soft-tissue]
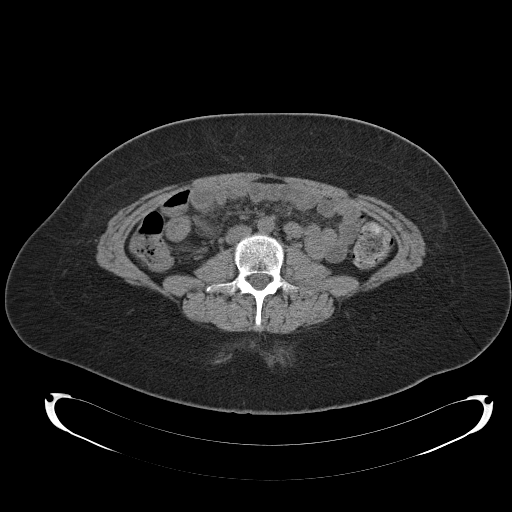
[im 62/111  soft-tissue]
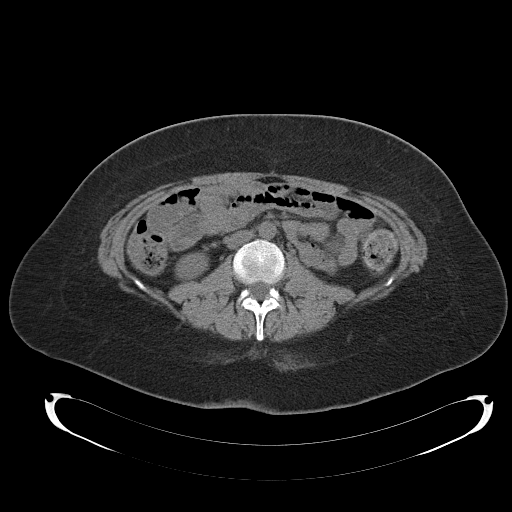
[im 71/111  soft-tissue]
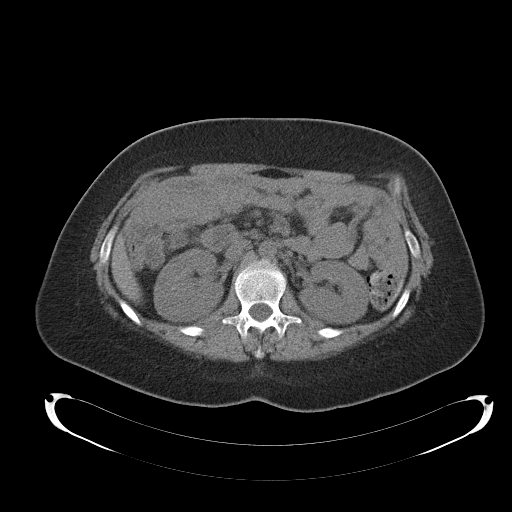
[im 71/111  bone]
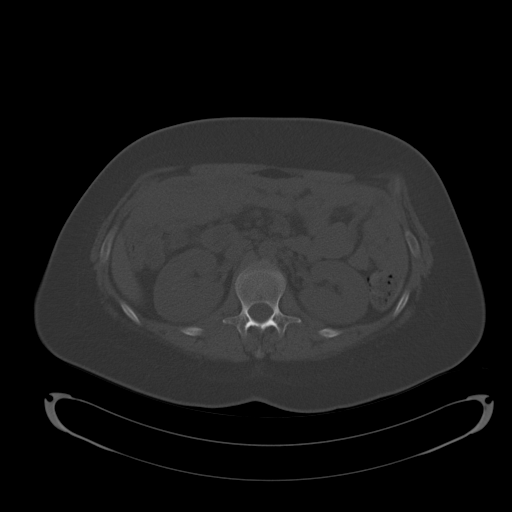
[im 80/111  soft-tissue]
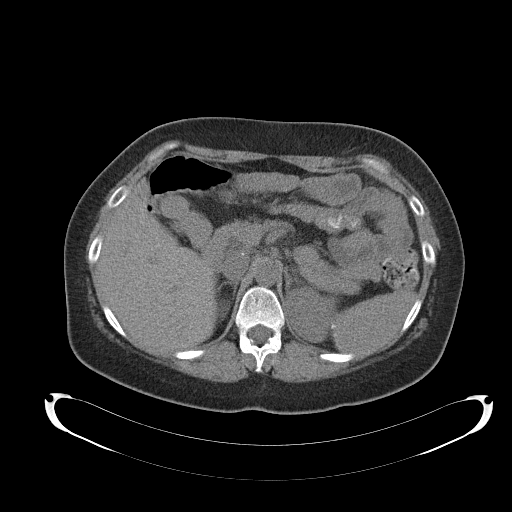
[im 89/111  soft-tissue]
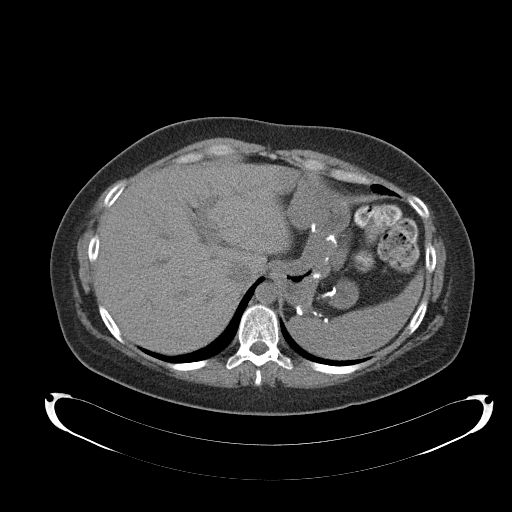
[im 97/111  soft-tissue]
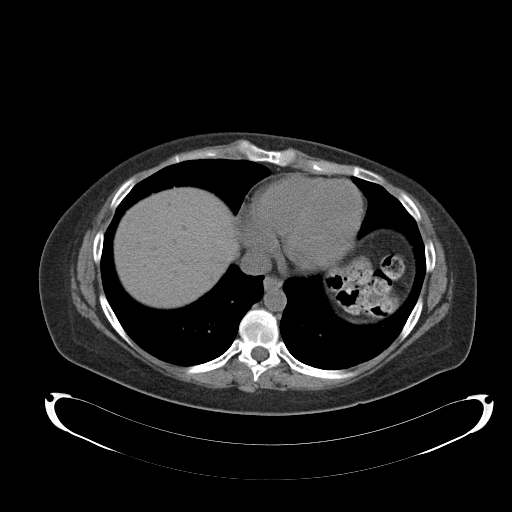
[im 106/111  soft-tissue]
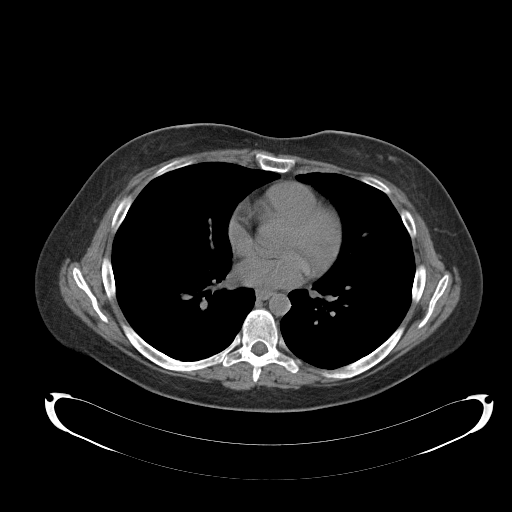

[Series 3: coronal · coronal · 0.90mm/px · 3 of 143 slices shown]
[im 48/143  soft-tissue]
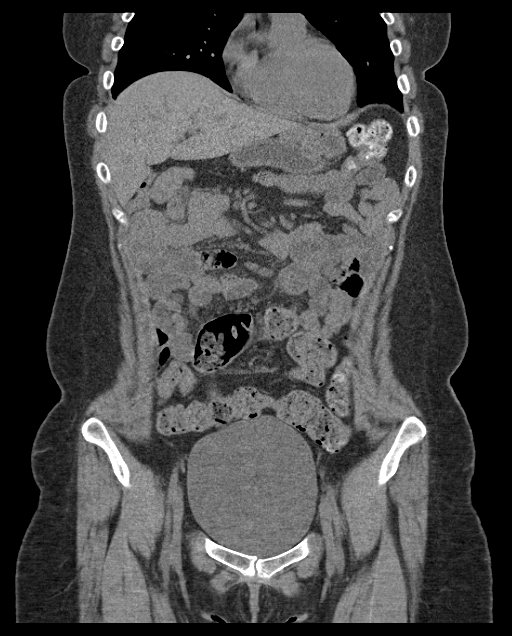
[im 64/143  soft-tissue]
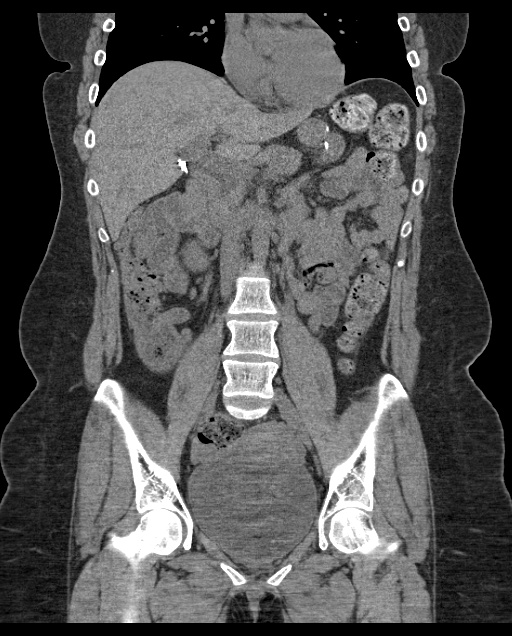
[im 79/143  soft-tissue]
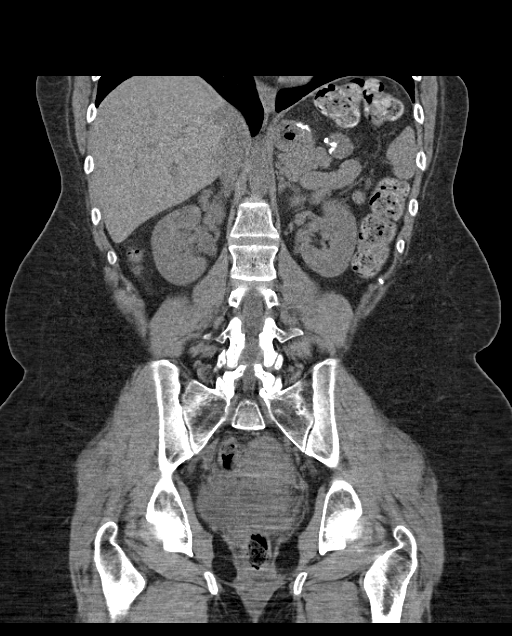

[16 of 46 positions shown; findings below may reference images not displayed]

FINDINGS: Lower chest:  The lung bases are unremarkable.

Hepatobiliary: Unenhanced liver shows no biliary ductal dilatation.
The patient is status postcholecystectomy.

Pancreas: Unenhanced pancreas is unremarkable.

Spleen: Unenhanced spleen is unremarkable.

Adrenals/Urinary Tract: No adrenal gland mass. Unenhanced kidneys
are symmetrical in size. No hydronephrosis or hydroureter. No
calcified ureteral calculi. No nephrolithiasis. Moderate distended
urinary bladder. No calcified calculi are noted within urinary
bladder.

Stomach/Bowel: The patient is status post gastric bypass surgery. No
small bowel obstruction. Terminal ileum is unremarkable. There is no
pericecal inflammation. Normal appendix partially visualized in
coronal image 55. Moderate stool noted within descending colon.
Moderate stool noted within sigmoid colon. No distal colonic
obstruction.

Vascular/Lymphatic: No retroperitoneal or mesenteric adenopathy. No
aortic aneurysm.

Reproductive: The unenhanced uterus is anteflexed.  No adnexal mass.

Other: No ascites or free air.  No inguinal adenopathy.

Musculoskeletal: No destructive bony lesions are noted. No acute
fractures.
IMPRESSION: 1. There is no evidence of nephrolithiasis. No hydronephrosis or
hydroureter.
2. No calcified ureteral calculi.
3. Status post gastric bypass surgery.
4. Moderate distended urinary bladder. No evidence of calcified
calculi within urinary bladder.
5. Status postcholecystectomy.
6. No pericecal inflammation.  Normal appendix partially visualized.
7. Moderate colonic stool noted within distal colon. No evidence of
colonic obstruction.
8.

## 2016-08-05 NOTE — Telephone Encounter (Signed)
Advised patient that Dr Nicki Reaper did in fact call Medicaid regarding the locked in prescription program that the patient brought to his attention. The official at [facility] stated that when she gets established with pain medicine doctor in September she can have that Dr. placed as the doctor who would be able to write controlled medicines. Medicaid stated that we would not be the doctor on the locked in prescription program since we are not prescribing controlled medications to her. Dr Nicki Reaper highly recommends that she call the phone number on the Medicaid locked in program sheet in order to clarify any question she may have about this program. According to Medicaid we can continue being her family physician for routine stuff such as sinus infection blood pressure checks etc. but we would not be able to write any controlled medications.  Patient verbalized understanding.

## 2016-08-06 ENCOUNTER — Encounter (HOSPITAL_COMMUNITY): Payer: Self-pay | Admitting: Psychiatry

## 2016-08-06 ENCOUNTER — Ambulatory Visit (INDEPENDENT_AMBULATORY_CARE_PROVIDER_SITE_OTHER): Payer: Medicaid Other | Admitting: Psychiatry

## 2016-08-06 VITALS — BP 118/78 | HR 78 | Ht 66.0 in | Wt 216.8 lb

## 2016-08-06 DIAGNOSIS — F332 Major depressive disorder, recurrent severe without psychotic features: Secondary | ICD-10-CM

## 2016-08-06 MED ORDER — CLONAZEPAM 0.5 MG PO TABS: 0.5000 mg | ORAL_TABLET | Freq: Three times a day (TID) | ORAL | 2 refills | Status: DC | PRN

## 2016-08-06 MED ORDER — CARBAMAZEPINE 200 MG PO TABS: ORAL_TABLET | ORAL | 2 refills | Status: DC

## 2016-08-06 MED ORDER — AMPHETAMINE-DEXTROAMPHET ER 30 MG PO CP24: 30.0000 mg | ORAL_CAPSULE | Freq: Every day | ORAL | 0 refills | Status: DC

## 2016-08-06 MED ORDER — ZOLPIDEM TARTRATE ER 12.5 MG PO TBCR: 12.5000 mg | EXTENDED_RELEASE_TABLET | Freq: Every evening | ORAL | 2 refills | Status: DC | PRN

## 2016-08-06 MED ORDER — DULOXETINE HCL 60 MG PO CPEP: 60.0000 mg | ORAL_CAPSULE | Freq: Two times a day (BID) | ORAL | 2 refills | Status: DC
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
## 2016-08-17 ENCOUNTER — Telehealth (HOSPITAL_COMMUNITY): Payer: Self-pay | Admitting: *Deleted

## 2016-08-17 NOTE — Telephone Encounter (Signed)
Prior authorization for Ambien CR received. Filled out form online and sent by email to office staff for MD signature and to be faxed to [place] [facility].

## 2016-08-18 ENCOUNTER — Telehealth: Payer: Self-pay | Admitting: Family Medicine

## 2016-08-18 NOTE — Telephone Encounter (Signed)
Unintended him was done for her last visit in late August. Please see dictation know you to make a copy of this and send this to them thank you

## 2016-08-18 NOTE — Telephone Encounter (Signed)
Faxed Prior Auth request for pt Zolpidem Tartrate ER to [facility] at [phone]

## 2016-08-18 NOTE — Telephone Encounter (Signed)
Patient is working on getting her ASO brace authorized through [place], but because she has Medicaid, she said she needs it to be dicatated in the notes that Dr. Nicki Reaper and her have spoke about the ASO brace while she was in the office.  She is hoping this can be done ASAP because they want to fit her soon due to her falls.  Contact #  Joann (p) [phone] Fax # [phone]

## 2016-08-18 NOTE — Telephone Encounter (Signed)
Faxed OV note, notified on voicemail.

## 2016-08-19 ENCOUNTER — Telehealth: Payer: Self-pay

## 2016-08-19 ENCOUNTER — Ambulatory Visit (HOSPITAL_COMMUNITY): Payer: Self-pay | Admitting: Psychiatry

## 2016-08-19 ENCOUNTER — Ambulatory Visit: Payer: Medicaid Other | Admitting: Family Medicine

## 2016-08-19 NOTE — Telephone Encounter (Signed)
Patient was seen at her pain specialist today. Patient called and stated that her pain specialist needed to speak to Dr. Nicki Reaper asap. I asked patient to let me speak with a professional at the facility. I spoke with [place] at [facility] and she stated that they did not request to speak to speak to [name]. They did not prescribe any medication to the patient on the first visit because they have to receive all her records, imaging and lab reports and then the doctor will decide the best course of treatment based on those records. She stated that patient will have to get any pain medication from her pcp until her next visit with them if that is what our doctor is insisting.  (Faxed form from office with summary of today's visit is in folder in [place] office)

## 2016-08-20 NOTE — Telephone Encounter (Signed)
Pt states she has seen pain management specialist. Has future appointment with [facility]. Requesting to speak with Dr.Scott.

## 2016-08-20 NOTE — Telephone Encounter (Signed)
Certainly we will cooperate with any request. As for this particular letter that was sent it does not request records at this point I seen there is an additional request coming?(As for our practice we will not be prescribing pain medicines in the meantime)

## 2016-08-22 NOTE — Telephone Encounter (Signed)
I am sympathetic to the fact that the patient is having pain. As explained to the patient on previous office visit we will not be prescribing pain medications. Whether or not pain medicines are prescribed. Is purely up to pain management.

## 2016-08-25 ENCOUNTER — Telehealth: Payer: Self-pay | Admitting: Family Medicine

## 2016-08-25 NOTE — Telephone Encounter (Signed)
Please call the patient needing more information.(Patient has very challenging issues need to find out from her what is going on what she is hoping the muscle relaxer will do. Nurse's FYI I will not be prescribing narcotic medications)

## 2016-08-25 NOTE — Telephone Encounter (Signed)
Patient states the tiZANidine (ZANAFLEX) 2 MG tablet she was prescribed is not working, she says nothing working.  Please advise.

## 2016-08-25 NOTE — Telephone Encounter (Signed)
Spoke with patient and patient stated that she is having numbness to her left side and unable to move her left side. Asked patient if she was experiencing pain and patient stated that she is just not able to move her left side and it is numb and she is laying there like a "vegetable". Please advise?

## 2016-08-25 NOTE — Telephone Encounter (Signed)
Discussed with pt that dr Nicki Reaper recommends she go directly to ED

## 2016-08-30 ENCOUNTER — Telehealth: Payer: Self-pay | Admitting: Family Medicine

## 2016-08-30 ENCOUNTER — Emergency Department (HOSPITAL_COMMUNITY)
Admission: EM | Admit: 2016-08-30 | Discharge: 2016-08-31 | Disposition: A | Discharge status: Home / Self Care | Payer: Medicaid Other | Attending: Emergency Medicine | Admitting: Emergency Medicine

## 2016-08-30 ENCOUNTER — Encounter (HOSPITAL_COMMUNITY): Payer: Self-pay | Admitting: *Deleted

## 2016-08-30 DIAGNOSIS — Z87891 Personal history of nicotine dependence: Secondary | ICD-10-CM | POA: Insufficient documentation

## 2016-08-30 DIAGNOSIS — M62830 Muscle spasm of back: Secondary | ICD-10-CM | POA: Insufficient documentation

## 2016-08-30 DIAGNOSIS — F909 Attention-deficit hyperactivity disorder, unspecified type: Secondary | ICD-10-CM | POA: Diagnosis not present

## 2016-08-30 DIAGNOSIS — G8929 Other chronic pain: Secondary | ICD-10-CM | POA: Insufficient documentation

## 2016-08-30 DIAGNOSIS — M549 Dorsalgia, unspecified: Secondary | ICD-10-CM | POA: Diagnosis present

## 2016-08-30 NOTE — Telephone Encounter (Signed)
May have refills on her Zanaflex 3 refills, may have prescription for TENS unit

## 2016-08-30 NOTE — ED Provider Notes (Signed)
[facility] DEPT Provider Note   CSN: [id number] Arrival date & time: 08/30/16  2150  By signing my name below, I, Gwenlyn Fudge, attest that this documentation has been prepared under the direction and in the presence of Jola Schmidt, MD. Electronically Signed: Gwenlyn Fudge, ED Scribe. 08/30/16. 12:02 AM.   History   Chief Complaint Chief Complaint  Patient presents with  . Weakness   The history is provided by the patient. No language interpreter was used.   HPI Comments: Penny [name] is a 30 y.o. female with PMHx of Hereditary and idiopathic peripheral neuropathy who presents to the Emergency Department complaining of gradual worsening chronic back pain onset 3 days PTA. She states her pain is localized to her mid and left side of her back. 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 She last took Oxycodone and warm compress at 8:30 PM with no relief. Symptoms first presented after giving birth. She is in the process of pain management. Pt reports associated chest pain onset today and abdominal pain. Pt was taking muscle relaxant with no significant relief to pain. Pt states she falls regularly. Denies fever, chills, shortness of breath.  Pt has been on Cymbalta and Topamax.   Past Medical History:  Diagnosis Date  . ADD (attention deficit disorder)   . Anemia 2011   2o to GASTRIC BYPASS  . Anginal pain ([place])   . Anxiety   . Blood transfusion without reported diagnosis   . Chronic daily headache   . Depression   . Depression   . Dysmenorrhea 09/18/2013  . Dysrhythmia   . Elevated liver enzymes JUL 2011ALK PHOS 111-127 AST  143-267 ALT  213-321T BILI 0.6  ALB  3.7-4.06 Jun 2011 ALK PHOS 118 AST 24 ALT 42 T BILI 0.4 ALB 3.9  . Encounter for drug rehabilitation    at behavioral health for opioid addiction about 4 years ago  . Family history of adverse reaction to anesthesia    'dad had to be kept on  pump for breathing for morphine'  . Fatty liver   . Fibromyalgia   . Gastric bypass status for obesity   . Gastritis JULY 2011  . Heart murmur   . 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 History of Holter monitoring   . Hx of opioid abuse    for about 4 years, about 4 years ago  . Interstitial cystitis   . Iron deficiency anemia 07/23/2010  . Irritable bowel syndrome 2012 DIARRHEA   JUN 2012 TTG IgA 14.9  . IUD FEB 2010  . Lupus ([place])   . Menorrhagia 07/18/2013  . Migraines   . Obesity (BMI 30-39.9) 2011 228 LBS BMI 36.8  . Ovarian cyst   . Patient desires pregnancy 09/18/2013  . PONV (postoperative nausea and vomiting) seizure post-operatively  . Potassium (K) deficiency   . Pregnant 12/25/2013  . Psychiatric pseudoseizure   . RLQ abdominal pain 07/18/2013  . Sciatica of left side 11/14/2014  . 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 Stress 09/18/2013    Patient Active Problem List   Diagnosis Date Noted  . Complaints of leg weakness   . Paresthesia   . Left-sided weakness   . Uncontrolled pain 01/24/2016  . Malnutrition of moderate degree 01/24/2016  . Neuropathy ([place]) 01/23/2016  . Inability to walk 01/23/2016  . Paresthesias   . Bilateral leg numbness 11/26/2015  . 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 Post partum depression 05/09/2015  . Anastomotic ulcer   . Dysphagia   . Hematemesis 04/07/2015  . Intractable vomiting with nausea 04/07/2015  . Elevated liver enzymes   . Epigastric pain   . 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 Seizure-like activity ([place]) 02/22/2015  . Fibromyalgia 02/18/2015  . Vulvar fissure 02/18/2015  . Clinical depression 02/01/2015  . Current smoker 01/29/2015  . Current tobacco use 01/29/2015  . 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 Leg weakness, bilateral 01/02/2015  . Gait disorder 01/02/2015  . Other symptoms and signs involving  the musculoskeletal system 01/02/2015  . Abnormal gait 01/02/2015  . Cervicalgia 12/18/2014  . Sciatica of left side 11/14/2014  . Neuralgia neuritis, sciatic nerve 11/14/2014  . 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 Family planning 09/09/2014  . Headache, migraine 08/19/2014  . Seizure ([place]) 08/18/2014  . Encephalopathy 08/13/2014  . Headache 08/13/2014  . Altered mental status 08/13/2014  . Right leg numbness 07/31/2014  . Disturbance of skin sensation 07/31/2014  . Hypertension in pregnancy, transient 07/08/2014  . Previous gastric bypass affecting pregnancy, antepartum 05/22/2014  . 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 ASD (atrial septal defect), ostium secundum 05/17/2014  . Depression complicating pregnancy in second trimester, antepartum 05/09/2014  . Antepartum mental disorder in pregnancy 05/09/2014  . Rapid palpitations 02/18/2014  . H/O maternal third degree perineal laceration, currently pregnant 02/18/2014  . High-risk pregnancy 02/18/2014  . Supervision of pregnancy with other poor reproductive or obstetric history, unspecified trimester 02/18/2014  . Maternal iron deficiency anemia complicating pregnancy 16/94/5038  . Restless legs 01/16/2014  . Restless leg 01/16/2014  . Chronic interstitial cystitis 10/23/2013  . Menorrhagia 07/18/2013  . Excessive and frequent menstruation 07/18/2013  . h/o Opiate addiction 03/11/2012    Class: Acute  . History of migraine headaches 03/10/2012    Class: Acute  . Depression with anxiety 03/10/2012    Class: Chronic  . 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 Panic disorder without agoraphobia 03/10/2012  . H/O disease 03/10/2012  . Dysthymia 03/10/2012  . Pelvic congestion syndrome 10/13/2011  . Coitalgia 10/13/2011  . Chronic migraine without aura 10/13/2011  .  Unspecified dyspareunia 10/13/2011  . IBS (irritable bowel syndrome) 08/25/2011  . Diarrhea 05/27/2011  . OBESITY, UNSPECIFIED 09/17/2010  . Transaminitis 09/17/2010  . Iron deficiency anemia 07/23/2010    Past Surgical History:  Procedure Laterality Date  . BIOPSY  04/10/2015   Procedure: BIOPSY;  Surgeon: Daneil Dolin, MD;  Location: [facility];  Service: Endoscopy;;  . cath self every nite     for sodium bicarb injection (discontinued 2013)  . CHOLECYSTECTOMY  2005   biliary dyskinesia  . COLONOSCOPY  JUN 2012 ABD PN/DIARRHEA WITH PROPOFOL   NL COLON  . DILATION AND CURETTAGE OF UTERUS    . ESOPHAGEAL DILATION N/A 04/10/2015   Procedure: ESOPHAGEAL DILATION WITH 54FR MALONEY DILATOR;  Surgeon: Daneil Dolin, MD;  Location: [facility];  Service: Endoscopy;  Laterality: N/A;  . ESOPHAGOGASTRODUODENOSCOPY    . ESOPHAGOGASTRODUODENOSCOPY (EGD) WITH PROPOFOL N/A 04/10/2015   Procedure: ESOPHAGOGASTRODUODENOSCOPY (EGD) WITH PROPOFOL;  Surgeon: Daneil Dolin, MD;  Location: [facility];  Service: Endoscopy;  Laterality: N/A;  . GAB  2007   in [place]-POUCH 5 CM  . GASTRIC  BYPASS  06/2006  . HYSTEROSCOPY W/D&C N/A 09/12/2014   Procedure: DILATATION AND CURETTAGE /HYSTEROSCOPY;  Surgeon: Jonnie Kind, MD;  Location: [facility];  Service: Gynecology;  Laterality: N/A;  . REPAIR VAGINAL CUFF N/A 07/30/2014   Procedure: REPAIR VAGINAL CUFF;  Surgeon: Mora Bellman, MD;  Location: [facility];  Service: Gynecology;  Laterality: N/A;  . SAVORY DILATION  06/20/2012   Dr. Barnie Alderman gastritis/Ulcer in the mid jejunum. Empiric dilation.   . SURAL NERVE BX Left 02/25/2016   Procedure: LEFT SURAL NERVE BIOPSY;  Surgeon: Jovita Gamma, MD;  Location: [facility];  Service: Neurosurgery;  Laterality: Left;  Left sural nerve biopsy  . TONSILLECTOMY    . TONSILLECTOMY AND ADENOIDECTOMY    . UPPER GASTROINTESTINAL ENDOSCOPY  JULY 2011 NAUSEA-D125,V6, PH 25   Bx; GASTRITIS, POUCH-5 CM LONG  . WISDOM TOOTH EXTRACTION       OB History    Gravida Para Term Preterm AB Living   _0 SAB TAB Ectopic Multiple Live Births   1       2       Home Medications    Prior to Admission medications   Medication Sig Start Date End Date Taking? Authorizing Provider  albuterol (PROVENTIL HFA;VENTOLIN HFA) 108 (90 Base) MCG/ACT inhaler Inhale 2 puffs into the lungs every 6 (six) hours as needed for wheezing. 05/28/16   Kathyrn Drown, MD  amphetamine-dextroamphetamine (ADDERALL XR) 30 MG 24 hr capsule Take 1 capsule (30 mg total) by mouth daily. 08/06/16   Cloria Spring, MD  APAP-Pamabrom-Pyrilamine (MENSTRUAL PAIN RELIEF) 500-25-15 MG TABS Take 1-2 tablets by mouth as needed (for pain). Reported on 06/09/2016    Historical Provider, MD  carbamazepine (TEGRETOL) 200 MG tablet Take two tablets at bedtime 08/06/16   Cloria Spring, MD  clonazePAM (KLONOPIN) 0.5 MG tablet Take 1 tablet (0.5 mg total) by mouth 3 (three) times daily as needed for anxiety. 08/06/16 08/06/17  Cloria Spring, MD  DULoxetine (CYMBALTA) 60 MG capsule Take 1 capsule (60 mg total) by mouth 2 (two) times daily. 08/06/16   Cloria Spring, MD  folic acid (FOLVITE) 628 MCG tablet Take 800 mcg by mouth at bedtime. Reported on 06/09/2016    Historical Provider, MD  pantoprazole (PROTONIX) 40 MG tablet Take 1 tablet (40 mg total) by mouth daily. 06/10/16   Kathyrn Drown, MD  phenazopyridine (PYRIDIUM) 100 MG tablet Take 1 tablet (100 mg total) by mouth 3 (three) times daily as needed for pain. 02/26/16   Kathyrn Drown, MD  promethazine (PHENERGAN) 25 MG suppository Place 1 suppository (25 mg total) rectally every 6 (six) hours as needed for nausea or vomiting. 06/15/16   Milton Ferguson, MD  tiZANidine (ZANAFLEX) 2 MG tablet Take 1 tablet by mouth BID prn 07/15/16   Kathyrn Drown, MD  topiramate (TOPAMAX) 50 MG tablet TAKE 1 TABLET BY MOUTH EACH MORNING AND 2 TABLETS IN THE EVENING AS NEEDED FOR MIGRAINES. Patient taking differently: Take 50-100 mg by mouth 2 (two)  times daily. TAKE 1 TABLET BY MOUTH EACH MORNING AND 2 TABLETS IN THE EVENING AS NEEDED FOR MIGRAINES. 03/09/16   Kathyrn Drown, MD  zolpidem (AMBIEN CR) 12.5 MG CR tablet Take 1 tablet (12.5 mg total) by mouth at bedtime as needed for sleep. 08/06/16 08/06/17  Cloria Spring, MD    Family History Family History  Problem Relation Age of Onset  . Hemochromatosis Maternal  Grandmother   . Migraines Maternal Grandmother   . Cancer Maternal Grandmother   . Breast cancer Maternal Grandmother   . Hypertension Father   . Diabetes Father   . Coronary artery disease Father   . Migraines Paternal Grandmother   . Breast cancer Paternal Grandmother   . Cancer Mother     breast  . Hemochromatosis Mother   . Breast cancer Mother   . Depression Mother   . Anxiety disorder Mother   . Coronary artery disease Paternal Grandfather   . Anxiety disorder Brother   . Bipolar disorder Brother   . Healthy Son   . Healthy Daughter     Social History Social History  Substance Use Topics  . Smoking status: Former Smoker    Packs/day: 0.25    Years: 6.00    Types: Cigarettes    Quit date: 11/29/2015  . Smokeless tobacco: Never Used  . Alcohol use 0.0 oz/week     Comment: once every weeks - maybe a beer with a dinner, rarely     Allergies   Gabapentin; Metoclopramide hcl; Tramadol; Morphine and related; Nucynta [tapentadol]; Propofol; Trazodone and nefazodone; Zofran [ondansetron]; Butrans [buprenorphine]; Latex; Lyrica [pregabalin]; and Tape   Review of Systems Review of Systems A complete 10 system review of systems was obtained and all systems are negative except as noted in the HPI and PMH.   Physical Exam Updated Vital Signs BP 127/77 (BP Location: Right Arm)   Pulse 90   Temp 98.2 F (36.8 C) (Oral)   Resp 18   Ht _0  (1.676 m)   Wt 216 lb (98 kg)   LMP 08/09/2016 (Approximate)   SpO2 100%   BMI 34.86 kg/m   Physical Exam  Constitutional: She is oriented to person, place, and  time. She appears well-developed and well-nourished. No distress.  HENT:  Head: Normocephalic and atraumatic.  Eyes: EOM are normal.  Neck: Normal range of motion.  Cardiovascular: Normal rate, regular rhythm and normal heart sounds.   Pulmonary/Chest: Effort normal and breath sounds normal.  Abdominal: Soft. She exhibits no distension. There is no tenderness.  Musculoskeletal: Normal range of motion.  Parathoracic tenderness with spasm on the left  Neurological: She is alert and oriented to person, place, and time.  Skin: Skin is warm and dry.  Psychiatric: She has a normal mood and affect. Judgment normal.  Nursing note and vitals reviewed.  ED Treatments / Results  DIAGNOSTIC STUDIES: Oxygen Saturation is 100% on RA, normal by my interpretation.    COORDINATION OF CARE: 11:57 PM Discussed treatment plan with pt at bedside which includes DG Chest, Valium and Oxycodone and pt agreed to plan.  Labs (all labs ordered are listed, but only abnormal results are displayed) Labs Reviewed - No data to display  EKG  EKG Interpretation None       Radiology No results found.  Procedures Procedures (including critical care time)  Medications Ordered in ED Medications - No data to display   Initial Impression / Assessment and Plan / ED Course  I have reviewed the triage vital signs and the nursing notes.  Pertinent labs & imaging results that were available during my care of the patient were reviewed by me and considered in my medical decision making (see chart for details).  Clinical Course   I personally performed the services described in this documentation, which was scribed in my presence. The recorded information has been reviewed and is accurate.  Improvement in symptoms. Appears to be thoracic spasm on the left. Heat, nsaids, muscle relaxants and prednisone prescribed. No life threatening emergency present. pcp follow up. Ongoing follow up with pain management  for improvement in long term symptom control  Final Clinical Impressions(s) / ED Diagnoses   Final diagnoses:  Back spasm    New Prescriptions Discharge Medication List as of 08/31/2016  2:21 AM    START taking these medications   Details  ibuprofen (ADVIL,MOTRIN) 600 MG tablet Take 1 tablet (600 mg total) by mouth every 8 (eight) hours as needed., Starting Tue 08/31/2016, Print    methocarbamol (ROBAXIN) 500 MG tablet Take 1 tablet (500 mg total) by mouth every 8 (eight) hours as needed for muscle spasms., Starting Tue 08/31/2016, Print    predniSONE (DELTASONE) 10 MG tablet Take 6 tablets (60 mg total) by mouth daily., Starting Tue 08/31/2016, Print         Jola Schmidt, MD 08/31/16 [phone]

## 2016-08-30 NOTE — ED Triage Notes (Signed)
Pt started having increasing pain and weakness on Friday, progressing through the weekend. C/o pain all over, specifically in the chest, back, and all on the left side. Last took oxycodone 2030, without relief. Pt always has weakness on the left side d/t genetic neuropathy, but weakness worse since Friday.

## 2016-08-30 NOTE — Telephone Encounter (Signed)
Patient wants to know if we can write Rx for her to have a tens unit.  She said pain management cannot see her until September 06, 2016.   Also, wanting to know if she can have Rx for her muscle relaxers.   [employer]

## 2016-08-31 ENCOUNTER — Emergency Department (HOSPITAL_COMMUNITY): Payer: Medicaid Other

## 2016-08-31 IMAGING — CR DG CHEST 1V PORT
1 series · 1 of 1 positions shown · non-contrast
Comparison: 08/21/2013 and 08/12/2013

CLINICAL DATA: D and C followed by headache, seizure-like activity,
tachycardia and hypoxia. Patient 6 weeks postpartum.

EXAM:
PORTABLE CHEST - 1 VIEW

[portable]
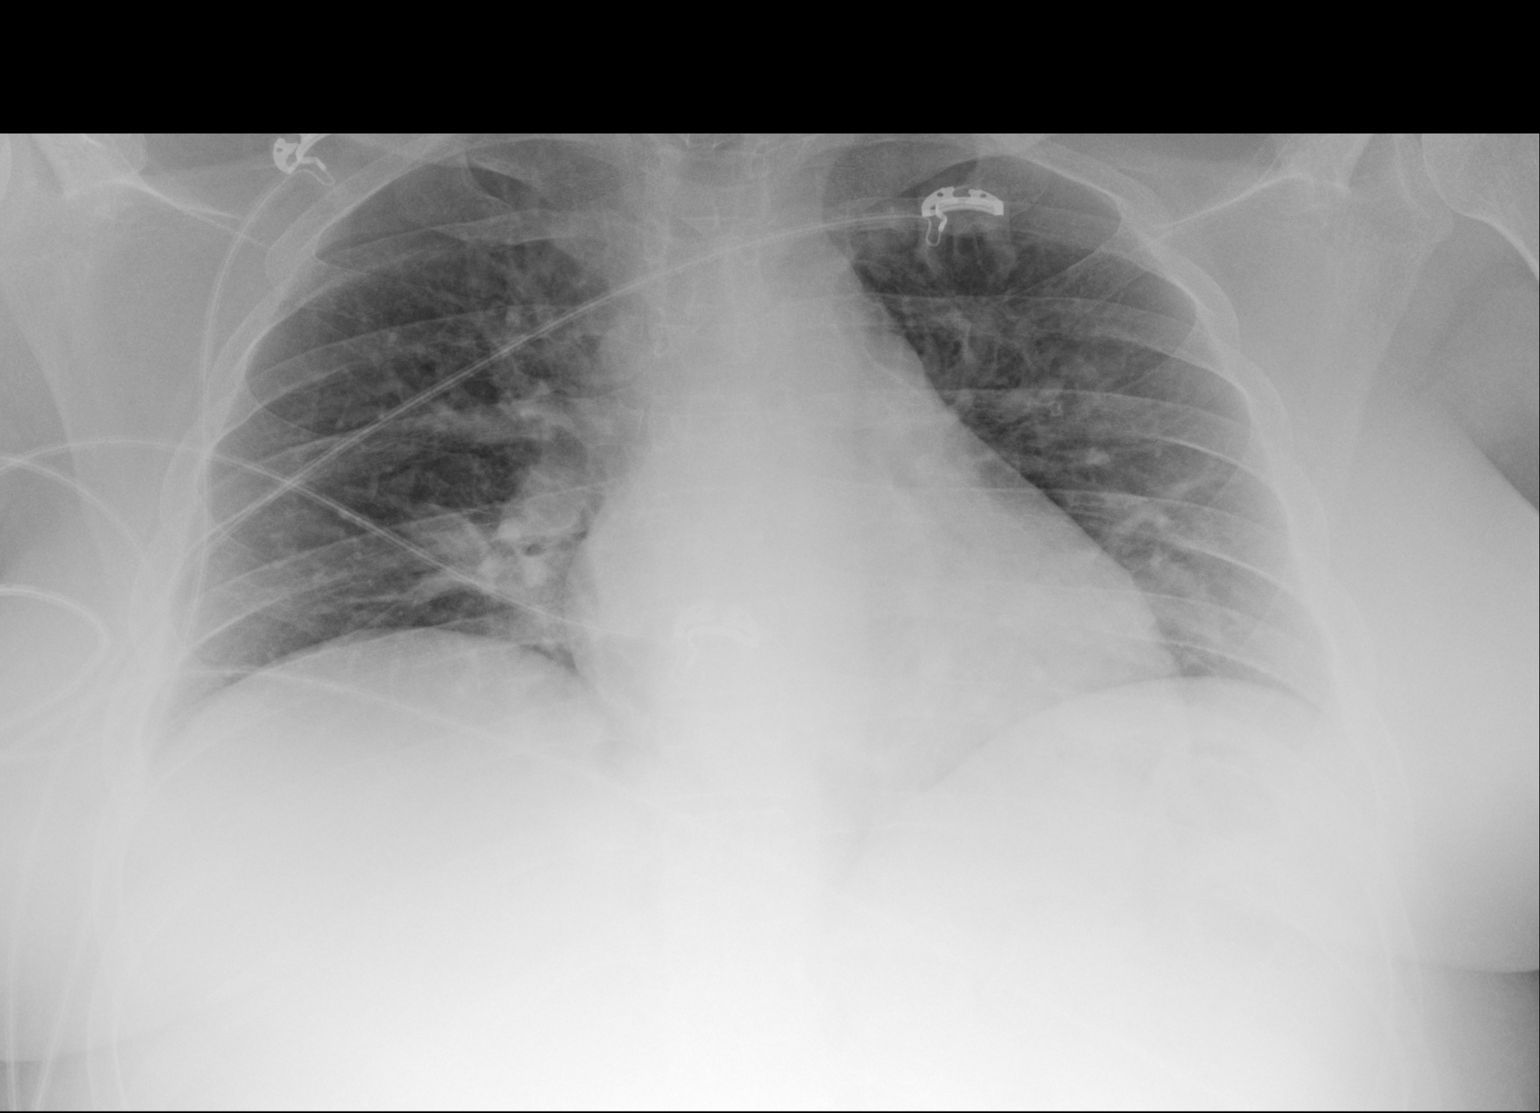

[1 of 1 positions shown; findings below may reference images not displayed]

FINDINGS: Lungs are hypoinflated with minimal bibasilar linear density likely
atelectasis. No evidence of effusion or pneumothorax.
Cardiomediastinal silhouette and remainder of the exam is unchanged.
IMPRESSION: Hypoinflation with mild bibasilar atelectasis.

## 2016-08-31 MED ORDER — IBUPROFEN 600 MG PO TABS: 600.0000 mg | ORAL_TABLET | Freq: Three times a day (TID) | ORAL | 0 refills | Status: DC | PRN

## 2016-08-31 MED ORDER — OXYCODONE-ACETAMINOPHEN 5-325 MG PO TABS
1.0000 | ORAL_TABLET | Freq: Once | ORAL | Status: AC
  Administered 2016-08-31: 1 via ORAL
  Filled 2016-08-31: qty 1

## 2016-08-31 MED ORDER — KETOROLAC TROMETHAMINE 60 MG/2ML IM SOLN
60.0000 mg | Freq: Once | INTRAMUSCULAR | Status: AC
  Administered 2016-08-31: 60 mg via INTRAMUSCULAR
  Filled 2016-08-31: qty 2

## 2016-08-31 MED ORDER — PREDNISONE 10 MG PO TABS
60.0000 mg | ORAL_TABLET | Freq: Once | ORAL | Status: AC
  Administered 2016-08-31: 60 mg via ORAL
  Filled 2016-08-31: qty 1

## 2016-08-31 MED ORDER — OXYCODONE-ACETAMINOPHEN 5-325 MG PO TABS
2.0000 | ORAL_TABLET | Freq: Once | ORAL | Status: AC
  Administered 2016-08-31: 2 via ORAL
  Filled 2016-08-31: qty 2

## 2016-08-31 MED ORDER — PREDNISONE 10 MG PO TABS: 60.0000 mg | ORAL_TABLET | Freq: Every day | ORAL | 0 refills | Status: DC

## 2016-08-31 MED ORDER — DIAZEPAM 5 MG PO TABS
5.0000 mg | ORAL_TABLET | Freq: Once | ORAL | Status: AC
Start: 2016-08-31 — End: 2016-08-31
  Administered 2016-08-31: 5 mg via ORAL
  Filled 2016-08-31: qty 1

## 2016-08-31 MED ORDER — METHOCARBAMOL 500 MG PO TABS: 500.0000 mg | ORAL_TABLET | Freq: Three times a day (TID) | ORAL | 0 refills | Status: DC | PRN

## 2016-08-31 MED ORDER — TIZANIDINE HCL 2 MG PO TABS: ORAL_TABLET | ORAL | 2 refills | Status: DC

## 2016-08-31 NOTE — ED Notes (Signed)
Pt changed mind and now wants Toradol injection.

## 2016-08-31 NOTE — ED Notes (Signed)
Pt taken to friend's truck ([place]) via wheelchair to passenger side- pt able to stand and step up in truck without assistance, standby assist by this nurse for safety reasons.

## 2016-08-31 NOTE — Telephone Encounter (Signed)
Prescriptions sent to the pharmacy. Patient notified.

## 2016-08-31 NOTE — ED Notes (Signed)
Pt on cell phone upon entering room. Pt refused IM injection of Toradol, but wants the rest of the medications ordered.

## 2016-09-01 ENCOUNTER — Ambulatory Visit: Payer: Self-pay | Admitting: Urology

## 2016-09-01 ENCOUNTER — Telehealth: Payer: Self-pay | Admitting: Family Medicine

## 2016-09-01 NOTE — Telephone Encounter (Signed)
Pt called stating that her insurance will not pay for the tens unit. Pt is wanting the directions to be changed to three times a day on her tiZANidine (ZANAFLEX) 2 MG tablet

## 2016-09-01 NOTE — Telephone Encounter (Signed)
Patient called back to check on message. Patient states she needs refill on zanaflex because the Robaxin the ER gave her the other night is not working and she is not seeing pain management until 09/06/16.

## 2016-09-01 NOTE — Telephone Encounter (Addendum)
Medication has been being prescribed as one tablet BID PRN muscle spasms #60.  Patient last got medication filled  08/10/16 for # 60 and can't get refilled again until 09/09/16 with current directions

## 2016-09-01 NOTE — Telephone Encounter (Signed)
May redo prescription to be Zanaflex 2 mg 1 3 times a day when necessary, #60, 3 refills

## 2016-09-01 NOTE — Telephone Encounter (Signed)
She may have a prescription for Zanaflex 2 mg 1 3 times a day when necessary muscle spasms since this is a new direction to the prescription they should go ahead and fill it. I recommend 45 tablets this will last her until she sees pain management.

## 2016-09-02 ENCOUNTER — Emergency Department (HOSPITAL_COMMUNITY)
Admission: EM | Admit: 2016-09-02 | Discharge: 2016-09-02 | Disposition: A | Discharge status: Home / Self Care | Payer: Medicaid Other | Attending: Emergency Medicine | Admitting: Emergency Medicine

## 2016-09-02 ENCOUNTER — Encounter (HOSPITAL_COMMUNITY): Payer: Self-pay | Admitting: Emergency Medicine

## 2016-09-02 DIAGNOSIS — F909 Attention-deficit hyperactivity disorder, unspecified type: Secondary | ICD-10-CM | POA: Diagnosis not present

## 2016-09-02 DIAGNOSIS — M546 Pain in thoracic spine: Secondary | ICD-10-CM | POA: Insufficient documentation

## 2016-09-02 DIAGNOSIS — M549 Dorsalgia, unspecified: Secondary | ICD-10-CM

## 2016-09-02 DIAGNOSIS — G8929 Other chronic pain: Secondary | ICD-10-CM | POA: Diagnosis not present

## 2016-09-02 DIAGNOSIS — Z79899 Other long term (current) drug therapy: Secondary | ICD-10-CM | POA: Diagnosis not present

## 2016-09-02 DIAGNOSIS — Z87891 Personal history of nicotine dependence: Secondary | ICD-10-CM | POA: Insufficient documentation

## 2016-09-02 DIAGNOSIS — Z791 Long term (current) use of non-steroidal anti-inflammatories (NSAID): Secondary | ICD-10-CM | POA: Diagnosis not present

## 2016-09-02 MED ORDER — CYCLOBENZAPRINE HCL 10 MG PO TABS: 10.0000 mg | ORAL_TABLET | Freq: Three times a day (TID) | ORAL | 0 refills | Status: DC | PRN

## 2016-09-02 MED ORDER — DIAZEPAM 5 MG/ML IJ SOLN
10.0000 mg | Freq: Once | INTRAMUSCULAR | Status: AC
  Administered 2016-09-02: 10 mg via INTRAMUSCULAR
  Filled 2016-09-02: qty 2

## 2016-09-02 MED ORDER — NAPROXEN 500 MG PO TABS: ORAL_TABLET | ORAL | 0 refills | Status: DC

## 2016-09-02 MED ORDER — CYCLOBENZAPRINE HCL 10 MG PO TABS
10.0000 mg | ORAL_TABLET | Freq: Once | ORAL | Status: AC
  Administered 2016-09-02: 10 mg via ORAL
  Filled 2016-09-02: qty 1

## 2016-09-02 MED ORDER — KETOROLAC TROMETHAMINE 60 MG/2ML IM SOLN
60.0000 mg | Freq: Once | INTRAMUSCULAR | Status: AC
  Administered 2016-09-02: 60 mg via INTRAMUSCULAR
  Filled 2016-09-02: qty 2

## 2016-09-02 MED ORDER — TIZANIDINE HCL 2 MG PO TABS: ORAL_TABLET | ORAL | 0 refills | Status: DC

## 2016-09-02 NOTE — Discharge Instructions (Signed)
Continue your medications. Stop the ibuprofen and start the naproxen. Take the flexeril for your pain. Follow up with your pain management doctors if you aren't improving.

## 2016-09-02 NOTE — Telephone Encounter (Signed)
Prescription sent electronically to pharmacy. Patient notified. 

## 2016-09-02 NOTE — ED Triage Notes (Signed)
Patient has severe upper back pain, shortness of breath when lying down, chronic left side weakness.

## 2016-09-02 NOTE — ED Provider Notes (Signed)
[facility] DEPT Provider Note   CSN: [id number] Arrival date & time: 09/02/16  0449  Time seen 05:38 AM   History   Chief Complaint Chief Complaint  Patient presents with  . Shortness of Breath  . Back Pain  . Extremity Weakness    HPI Penny [name] is a 30 y.o. female.  HPI  Patient reports she has been having pain in her upper back for the past year. She was seen in the ED on September 25 for the same. 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 She reports that she has tightness of her middle back. She states she has chronic left-sided weakness in her arm and leg normally but she feels like that may be getting a little worse because it hurts when she moves. She states she is waiting to get injections done in her back in 5 days. She denies fever, she has had nausea without vomiting, she states her pain is pleuritic however she does not have shortness of breath or cough. She states any type of movement makes the pain worse. Nothing makes it feel better. She states it difficult to walk because it makes her back hurt more. Patient has multiple chronic conditions including history of polysubstance abuse.  PCP Dr. Wolfgang Phoenix   Past Medical History:  Diagnosis Date  . ADD (attention deficit disorder)   . Anemia 2011   2o to GASTRIC BYPASS  . Anginal pain ([place])   . Anxiety   . Blood transfusion without reported diagnosis   . Chronic daily headache   . Depression   . Depression   . Dysmenorrhea 09/18/2013  . Dysrhythmia   . Elevated liver enzymes JUL 2011ALK PHOS 111-127 AST  143-267 ALT  213-321T BILI 0.6  ALB  3.7-4.06 Jun 2011 ALK PHOS 118 AST 24 ALT 42 T BILI 0.4 ALB 3.9  . Encounter for drug rehabilitation    at behavioral health for opioid addiction about 4 years ago  . Family history of adverse reaction to anesthesia    'dad had to be kept on pump for breathing for morphine'  . Fatty liver   . Fibromyalgia   . Gastric bypass  status for obesity   . Gastritis JULY 2011  . Heart murmur   . 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 History of Holter monitoring   . Hx of opioid abuse    for about 4 years, about 4 years ago  . Interstitial cystitis   . Iron deficiency anemia 07/23/2010  . Irritable bowel syndrome 2012 DIARRHEA   JUN 2012 TTG IgA 14.9  . IUD FEB 2010  . Lupus ([place])   . Menorrhagia 07/18/2013  . Migraines   . Obesity (BMI 30-39.9) 2011 228 LBS BMI 36.8  . Ovarian cyst   . Patient desires pregnancy 09/18/2013  . PONV (postoperative nausea and vomiting) seizure post-operatively  . Potassium (K) deficiency   . Pregnant 12/25/2013  . Psychiatric pseudoseizure   . RLQ abdominal pain 07/18/2013  . Sciatica of left side 11/14/2014  . 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 Stress 09/18/2013    Patient Active Problem List   Diagnosis Date Noted  . Complaints of leg weakness   . Paresthesia   . Left-sided weakness   . Uncontrolled pain 01/24/2016  . Malnutrition of moderate degree 01/24/2016  . Neuropathy ([place]) 01/23/2016  . Inability to walk 01/23/2016  . Paresthesias   . Bilateral leg numbness  11/26/2015  . 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 Post partum depression 05/09/2015  . Anastomotic ulcer   . Dysphagia   . Hematemesis 04/07/2015  . Intractable vomiting with nausea 04/07/2015  . Elevated liver enzymes   . Epigastric pain   . Pseudoseizure ([place]) 02/22/2015  . Seizure-like activity ([place]) 02/22/2015  . Fibromyalgia 02/18/2015  . Vulvar fissure 02/18/2015  . Clinical depression 02/01/2015  . Current smoker 01/29/2015  . Current tobacco use 01/29/2015  . 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 Leg weakness, bilateral 01/02/2015  . Gait disorder 01/02/2015  . Other symptoms and signs involving the musculoskeletal system 01/02/2015  . Abnormal gait 01/02/2015  . Cervicalgia  12/18/2014  . Sciatica of left side 11/14/2014  . Neuralgia neuritis, sciatic nerve 11/14/2014  . 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 Family planning 09/09/2014  . Headache, migraine 08/19/2014  . Seizure ([place]) 08/18/2014  . Encephalopathy 08/13/2014  . Headache 08/13/2014  . Altered mental status 08/13/2014  . Right leg numbness 07/31/2014  . Disturbance of skin sensation 07/31/2014  . Hypertension in pregnancy, transient 07/08/2014  . Previous gastric bypass affecting pregnancy, antepartum 05/22/2014  . 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 ASD (atrial septal defect), ostium secundum 05/17/2014  . Depression complicating pregnancy in second trimester, antepartum 05/09/2014  . Antepartum mental disorder in pregnancy 05/09/2014  . Rapid palpitations 02/18/2014  . H/O maternal third degree perineal laceration, currently pregnant 02/18/2014  . High-risk pregnancy 02/18/2014  . Supervision of pregnancy with other poor reproductive or obstetric history, unspecified trimester 02/18/2014  . Maternal iron deficiency anemia complicating pregnancy 63/33/5456  . Restless legs 01/16/2014  . Restless leg 01/16/2014  . Chronic interstitial cystitis 10/23/2013  . Menorrhagia 07/18/2013  . Excessive and frequent menstruation 07/18/2013  . h/o Opiate addiction 03/11/2012    Class: Acute  . History of migraine headaches 03/10/2012    Class: Acute  . Depression with anxiety 03/10/2012    Class: Chronic  . 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 Panic disorder without agoraphobia 03/10/2012  . H/O disease 03/10/2012  . Dysthymia 03/10/2012  . Pelvic congestion syndrome 10/13/2011  . Coitalgia 10/13/2011  . Chronic migraine without aura 10/13/2011  . Unspecified dyspareunia 10/13/2011  . IBS (irritable bowel syndrome) 08/25/2011  .  Diarrhea 05/27/2011  . OBESITY, UNSPECIFIED 09/17/2010  . Transaminitis 09/17/2010  . Iron deficiency anemia 07/23/2010    Past Surgical History:  Procedure Laterality Date  . BIOPSY  04/10/2015   Procedure: BIOPSY;  Surgeon: Daneil Dolin, MD;  Location: [facility];  Service: Endoscopy;;  . cath self every nite     for sodium bicarb injection (discontinued 2013)  . CHOLECYSTECTOMY  2005   biliary dyskinesia  . COLONOSCOPY  JUN 2012 ABD PN/DIARRHEA WITH PROPOFOL   NL COLON  . DILATION AND CURETTAGE OF UTERUS    . ESOPHAGEAL DILATION N/A 04/10/2015   Procedure: ESOPHAGEAL DILATION WITH 54FR MALONEY DILATOR;  Surgeon: Daneil Dolin, MD;  Location: [facility];  Service: Endoscopy;  Laterality: N/A;  . ESOPHAGOGASTRODUODENOSCOPY    . ESOPHAGOGASTRODUODENOSCOPY (EGD) WITH PROPOFOL N/A 04/10/2015   Procedure: ESOPHAGOGASTRODUODENOSCOPY (EGD) WITH PROPOFOL;  Surgeon: Daneil Dolin, MD;  Location: [facility];  Service: Endoscopy;  Laterality: N/A;  . GAB  2007   in [place]-POUCH 5  CM  . GASTRIC BYPASS  06/2006  . HYSTEROSCOPY W/D&C N/A 09/12/2014   Procedure: DILATATION AND CURETTAGE /HYSTEROSCOPY;  Surgeon: Jonnie Kind, MD;  Location: [facility];  Service: Gynecology;  Laterality: N/A;  . REPAIR VAGINAL CUFF N/A 07/30/2014   Procedure: REPAIR VAGINAL CUFF;  Surgeon: Mora Bellman, MD;  Location: [facility];  Service: Gynecology;  Laterality: N/A;  . SAVORY DILATION  06/20/2012   Dr. Barnie Alderman gastritis/Ulcer in the mid jejunum. Empiric dilation.   . SURAL NERVE BX Left 02/25/2016   Procedure: LEFT SURAL NERVE BIOPSY;  Surgeon: Jovita Gamma, MD;  Location: [facility];  Service: Neurosurgery;  Laterality: Left;  Left sural nerve biopsy  . TONSILLECTOMY    . TONSILLECTOMY AND ADENOIDECTOMY    . UPPER GASTROINTESTINAL ENDOSCOPY  JULY 2011 NAUSEA-D125,V6, PH 25   Bx; GASTRITIS, POUCH-5 CM LONG  . WISDOM TOOTH EXTRACTION      OB History    Gravida Para Term Preterm AB Living   _0 SAB TAB  Ectopic Multiple Live Births   1       2       Home Medications    Prior to Admission medications   Medication Sig Start Date End Date Taking? Authorizing Provider  albuterol (PROVENTIL HFA;VENTOLIN HFA) 108 (90 Base) MCG/ACT inhaler Inhale 2 puffs into the lungs every 6 (six) hours as needed for wheezing. 05/28/16  Yes Kathyrn Drown, MD  amphetamine-dextroamphetamine (ADDERALL XR) 30 MG 24 hr capsule Take 1 capsule (30 mg total) by mouth daily. 08/06/16  Yes Cloria Spring, MD  APAP-Pamabrom-Pyrilamine (MENSTRUAL PAIN RELIEF) 500-25-15 MG TABS Take 1-2 tablets by mouth as needed (for pain). Reported on 06/09/2016   Yes Historical Provider, MD  carbamazepine (TEGRETOL) 200 MG tablet Take two tablets at bedtime 08/06/16  Yes Cloria Spring, MD  clonazePAM (KLONOPIN) 0.5 MG tablet Take 1 tablet (0.5 mg total) by mouth 3 (three) times daily as needed for anxiety. 08/06/16 08/06/17 Yes Cloria Spring, MD  DULoxetine (CYMBALTA) 60 MG capsule Take 1 capsule (60 mg total) by mouth 2 (two) times daily. 08/06/16  Yes Cloria Spring, MD  folic acid (FOLVITE) 078 MCG tablet Take 800 mcg by mouth at bedtime. Reported on 06/09/2016   Yes Historical Provider, MD  ibuprofen (ADVIL,MOTRIN) 600 MG tablet Take 1 tablet (600 mg total) by mouth every 8 (eight) hours as needed. 08/31/16  Yes Jola Schmidt, MD  methocarbamol (ROBAXIN) 500 MG tablet Take 1 tablet (500 mg total) by mouth every 8 (eight) hours as needed for muscle spasms. 08/31/16  Yes Jola Schmidt, MD  pantoprazole (PROTONIX) 40 MG tablet Take 1 tablet (40 mg total) by mouth daily. 06/10/16  Yes Kathyrn Drown, MD  phenazopyridine (PYRIDIUM) 100 MG tablet Take 1 tablet (100 mg total) by mouth 3 (three) times daily as needed for pain. 02/26/16  Yes Kathyrn Drown, MD  predniSONE (DELTASONE) 10 MG tablet Take 6 tablets (60 mg total) by mouth daily. 08/31/16  Yes Jola Schmidt, MD  promethazine (PHENERGAN) 25 MG suppository Place 1 suppository (25 mg total) rectally every 6  (six) hours as needed for nausea or vomiting. 06/15/16  Yes Milton Ferguson, MD  tiZANidine (ZANAFLEX) 2 MG tablet Take 1 tablet by mouth BID prn 08/31/16  Yes Kathyrn Drown, MD  topiramate (TOPAMAX) 50 MG tablet TAKE 1 TABLET BY MOUTH EACH MORNING AND 2 TABLETS IN THE EVENING AS NEEDED FOR MIGRAINES. Patient taking  differently: Take 50-100 mg by mouth 2 (two) times daily. TAKE 1 TABLET BY MOUTH EACH MORNING AND 2 TABLETS IN THE EVENING AS NEEDED FOR MIGRAINES. 03/09/16  Yes Kathyrn Drown, MD  zolpidem (AMBIEN CR) 12.5 MG CR tablet Take 1 tablet (12.5 mg total) by mouth at bedtime as needed for sleep. 08/06/16 08/06/17 Yes Cloria Spring, MD  cyclobenzaprine (FLEXERIL) 10 MG tablet Take 1 tablet (10 mg total) by mouth 3 (three) times daily as needed for muscle spasms. 09/02/16   Rolland Porter, MD  naproxen (NAPROSYN) 500 MG tablet Take 1 po BID with food prn pain 09/02/16   Rolland Porter, MD    Family History Family History  Problem Relation Age of Onset  . Hemochromatosis Maternal Grandmother   . Migraines Maternal Grandmother   . Cancer Maternal Grandmother   . Breast cancer Maternal Grandmother   . Hypertension Father   . Diabetes Father   . Coronary artery disease Father   . Migraines Paternal Grandmother   . Breast cancer Paternal Grandmother   . Cancer Mother     breast  . Hemochromatosis Mother   . Breast cancer Mother   . Depression Mother   . Anxiety disorder Mother   . Coronary artery disease Paternal Grandfather   . Anxiety disorder Brother   . Bipolar disorder Brother   . Healthy Son   . Healthy Daughter     Social History Social History  Substance Use Topics  . Smoking status: Former Smoker    Packs/day: 0.25    Years: 6.00    Types: Cigarettes    Quit date: 11/29/2015  . Smokeless tobacco: Never Used  . Alcohol use 0.0 oz/week     Comment: once every weeks - maybe a beer with a dinner, rarely  Applying for disability Smokes 1 cigarette a day   Allergies   Gabapentin;  Metoclopramide hcl; Tramadol; Morphine and related; Nucynta [tapentadol]; Propofol; Trazodone and nefazodone; Zofran [ondansetron]; Butrans [buprenorphine]; Latex; Lyrica [pregabalin]; and Tape   Review of Systems Review of Systems  All other systems reviewed and are negative.    Physical Exam Updated Vital Signs BP 137/88   Pulse 93   Temp 98.4 F (36.9 C) (Oral)   Resp 16   Ht 5' 6" (1.676 m)   Wt 216 lb (98 kg)   LMP 08/09/2016 (Approximate)   SpO2 98%   BMI 34.86 kg/m   Vital signs normal    Physical Exam  Constitutional: She is oriented to person, place, and time. She appears well-developed and well-nourished.  Non-toxic appearance. She does not appear ill. She appears distressed.  HENT:  Head: Normocephalic and atraumatic.  Right Ear: External ear normal.  Left Ear: External ear normal.  Nose: Nose normal. No mucosal edema or rhinorrhea.  Mouth/Throat: Oropharynx is clear and moist and mucous membranes are normal. No dental abscesses or uvula swelling.  Eyes: Conjunctivae and EOM are normal. Pupils are equal, round, and reactive to light.  Neck: Normal range of motion and full passive range of motion without pain. Neck supple.  Cardiovascular: Normal rate, regular rhythm and normal heart sounds.  Exam reveals no gallop and no friction rub.   No murmur heard. Pulmonary/Chest: Effort normal and breath sounds normal. No respiratory distress. She has no wheezes. She has no rhonchi. She has no rales. She exhibits no tenderness and no crepitus.  Abdominal: Soft. Normal appearance and bowel sounds are normal. She exhibits no distension. There is no tenderness. There is  no rebound and no guarding.  Musculoskeletal: Normal range of motion. She exhibits no edema or tenderness.       Back:  Patient's patellar reflexes are 2+ and equal bilaterally, patient's tender diffusely in her thoracic and upper lumbar spine although it's inconsistent in the low lumbar area where initially  it was not painful  and then it was. She has to have her husband help her rollover for exam.  Neurological: She is alert and oriented to person, place, and time. She has normal strength. No cranial nerve deficit.  Skin: Skin is warm, dry and intact. No rash noted. No erythema. There is pallor.  Psychiatric: She has a normal mood and affect. Her speech is normal and behavior is normal. Her mood appears not anxious.  Nursing note and vitals reviewed.    ED Treatments / Results  Labs (all labs ordered are listed, but only abnormal results are displayed) Labs Reviewed - No data to display  EKG  EKG Interpretation  Date/Time:  Thursday September 02 2016 05:06:59 EDT Ventricular Rate:  79 PR Interval:    QRS Duration: 88 QT Interval:  372 QTC Calculation: 427 R Axis:   56 Text Interpretation:  Sinus rhythm Baseline wander in lead(s) V4 V6 Normal ECG No significant change since last tracing 30 Aug 2016 Confirmed by [facility]  MD-I, Bela Bonaparte ([id number]) on 09/02/2016 5:18:30 AM       Radiology No results found.  Procedures Procedures (including critical care time)  Medications Ordered in ED Medications  cyclobenzaprine (FLEXERIL) tablet 10 mg (not administered)  ketorolac (TORADOL) injection 60 mg (60 mg Intramuscular Given 09/02/16 0607)  diazepam (VALIUM) injection 10 mg (10 mg Intramuscular Given 09/02/16 0606)     Initial Impression / Assessment and Plan / ED Course  I have reviewed the triage vital signs and the nursing notes.  Pertinent labs & imaging results that were available during my care of the patient were reviewed by me and considered in my medical decision making (see chart for details).  Clinical Course   Patient was given Toradol and Valium IM for her pain.   Recheck at 06:50 am patient states she isn't any better however, she is sitting calmly on her stretcher and is visually not in as much pain as before. I asked her who was doing her spinal injections and she is now  going to pain management at [facility] in [place]. I explained I would not be giving her any narcotics tonight, and she states "why not they gave it to me last time". I explained to her Lema Heinkel reviewed her records and I would be glad to give her anything else. At this point she says she would like another muscle relaxer. She states Robaxin isn't helping so we will start her on Flexeril.  I reviewed her records from [facility]. She was last seen at the pain management clinic on July 18 and she was dismissed from their clinic. They felt like chronic narcotic pain medication was not appropriate for her care. They also question her compliance with their medication regimen that they recommended. They thought she should see a psychiatrist or therapist. I read her neurology note from May 2017. They did have a EMG which showed multiple mononeuropathies and she had a prior nerve biopsy that demonstrated pathological changes consistent with head of hereditary neuropathy with pressure palsies. She was advised to wear wrist splints and ankle splints which she is not wearing tonight. A do not see anywhere in our system or their  system where she is getting spinal injections of her back next week. She had a MRI of her thoracic and lumbar spine done in February of this year. She had a old T8 compression fracture in stable small L3-4 and L4-5 disc protrusions.   On review of the [place] she was getting Nucynta from Dr. Merlene Laughter in April and then her primary care doctor gave her 120 hydrocodone 10/325 mg in May, June, and July. She also has gotten several other prescriptions for hydrocodone 10/325 from him in June with amounts of 90 and 56. Her last narcotic was filled on August 29 when she got 12 hydrocodone 10/325 from her PCP.   Final Clinical Impressions(s) / ED Diagnoses   Final diagnoses:  Chronic back pain    New Prescriptions New Prescriptions   CYCLOBENZAPRINE (FLEXERIL) 10 MG TABLET    Take 1 tablet (10  mg total) by mouth 3 (three) times daily as needed for muscle spasms.   NAPROXEN (NAPROSYN) 500 MG TABLET    Take 1 po BID with food prn pain    Plan discharge  Rolland Porter, MD, Barbette Or, MD 09/02/16 [phone]

## 2016-09-03 ENCOUNTER — Ambulatory Visit (INDEPENDENT_AMBULATORY_CARE_PROVIDER_SITE_OTHER): Payer: Medicaid Other | Admitting: Psychiatry

## 2016-09-03 ENCOUNTER — Encounter (HOSPITAL_COMMUNITY): Payer: Self-pay | Admitting: Psychiatry

## 2016-09-03 VITALS — BP 131/69 | HR 75 | Ht 66.0 in | Wt 220.8 lb

## 2016-09-03 DIAGNOSIS — F332 Major depressive disorder, recurrent severe without psychotic features: Secondary | ICD-10-CM

## 2016-09-03 MED ORDER — AMPHETAMINE-DEXTROAMPHET ER 30 MG PO CP24
30.0000 mg | ORAL_CAPSULE | Freq: Every day | ORAL | 0 refills | Status: DC
Start: 2016-09-03 — End: 2016-09-28

## 2016-09-03 MED ORDER — AMPHETAMINE-DEXTROAMPHET ER 30 MG PO CP24: 30.0000 mg | ORAL_CAPSULE | Freq: Every day | ORAL | 0 refills | Status: DC
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
## 2016-09-10 ENCOUNTER — Other Ambulatory Visit: Payer: Self-pay | Admitting: Family Medicine

## 2016-09-16 ENCOUNTER — Ambulatory Visit (INDEPENDENT_AMBULATORY_CARE_PROVIDER_SITE_OTHER): Payer: Medicaid Other | Admitting: Family Medicine

## 2016-09-16 ENCOUNTER — Ambulatory Visit (HOSPITAL_COMMUNITY)
Admission: RE | Admit: 2016-09-16 | Discharge: 2016-09-16 | Disposition: A | Discharge status: Home / Self Care | Payer: Medicaid Other | Source: Ambulatory Visit | Attending: Family Medicine | Admitting: Family Medicine

## 2016-09-16 VITALS — BP 112/62 | Wt 227.1 lb

## 2016-09-16 DIAGNOSIS — M25572 Pain in left ankle and joints of left foot: Secondary | ICD-10-CM | POA: Diagnosis not present

## 2016-09-16 DIAGNOSIS — M8580 Other specified disorders of bone density and structure, unspecified site: Secondary | ICD-10-CM | POA: Insufficient documentation

## 2016-09-16 NOTE — Progress Notes (Signed)
   Subjective:    Patient ID: Penny [name], female    DOB: Mar 06, 1986, 30 y.o.   MRN: [id number]  Foot Pain  This is a new problem. The current episode started in the past 7 days. Treatments tried: Tylenol, Ibuprofen, hydrocodone.   Patient in today for dropping headboard on left foot. Patient also has concerns of rapid weight gain.    Review of Systems    patient relates severe ankle pain describes pain and discomfort with movement. Hard to walk on. Objective:   Physical Exam Tenderness swelling of the ankle no sign of cellulitis does have a scrape but there is no sign of infection calf is normal   X-rays were ordered they came back negative. Patient already on pain medicine. No additional medications needed follow-up of problems    Assessment & Plan:  Ankle pain foot pain should get better over time if not follow-up if worse call and follow-up of right away

## 2016-09-27 ENCOUNTER — Telehealth (HOSPITAL_COMMUNITY): Payer: Self-pay | Admitting: *Deleted

## 2016-09-27 NOTE — Telephone Encounter (Signed)
It looks like she can be scheduled for tomorrow

## 2016-09-27 NOTE — Telephone Encounter (Signed)
patient came in thought her appointment was today.   asked when is her appointment.   then asked if she can be seen sooner.    she said she is panic really bad, even after using meds and oils and breathing.  feelings of meds not working.   scared of getting out of the house.   Crying all the time and don't know why.

## 2016-09-28 ENCOUNTER — Telehealth (HOSPITAL_COMMUNITY): Payer: Self-pay | Admitting: *Deleted

## 2016-09-28 ENCOUNTER — Encounter (HOSPITAL_COMMUNITY): Payer: Self-pay | Admitting: Psychiatry

## 2016-09-28 ENCOUNTER — Ambulatory Visit (INDEPENDENT_AMBULATORY_CARE_PROVIDER_SITE_OTHER): Payer: Medicaid Other | Admitting: Psychiatry

## 2016-09-28 VITALS — BP 113/70 | HR 84 | Ht 66.0 in | Wt 218.4 lb

## 2016-09-28 DIAGNOSIS — Z87891 Personal history of nicotine dependence: Secondary | ICD-10-CM

## 2016-09-28 DIAGNOSIS — F332 Major depressive disorder, recurrent severe without psychotic features: Secondary | ICD-10-CM | POA: Diagnosis not present

## 2016-09-28 DIAGNOSIS — Z803 Family history of malignant neoplasm of breast: Secondary | ICD-10-CM | POA: Diagnosis not present

## 2016-09-28 DIAGNOSIS — Z833 Family history of diabetes mellitus: Secondary | ICD-10-CM

## 2016-09-28 DIAGNOSIS — Z888 Allergy status to other drugs, medicaments and biological substances status: Secondary | ICD-10-CM

## 2016-09-28 DIAGNOSIS — Z818 Family history of other mental and behavioral disorders: Secondary | ICD-10-CM | POA: Diagnosis not present

## 2016-09-28 DIAGNOSIS — Z8249 Family history of ischemic heart disease and other diseases of the circulatory system: Secondary | ICD-10-CM

## 2016-09-28 MED ORDER — ARIPIPRAZOLE 5 MG PO TABS: 5.0000 mg | ORAL_TABLET | Freq: Every day | ORAL | 2 refills | Status: DC

## 2016-09-28 MED ORDER — HYDROCODONE-ACETAMINOPHEN 10-325 MG PO TABS: 1.0000 | ORAL_TABLET | Freq: Four times a day (QID) | ORAL | 0 refills | Status: DC | PRN

## 2016-09-28 MED ORDER — ALPRAZOLAM 0.5 MG PO TABS: 0.5000 mg | ORAL_TABLET | Freq: Three times a day (TID) | ORAL | 0 refills | Status: DC | PRN

## 2016-09-28 NOTE — Telephone Encounter (Signed)
Per Dr. Harrington Challenger to call pt pharmacy and cancel pt Klonopin. Per pt notes, this medication is not working. Called pt pharmacy and spoke with Ovid Curd and he stated they have it cancelled in their system.
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
## 2016-09-28 NOTE — Telephone Encounter (Signed)
Prior authorization for Abilify received. Called [place] tracks spoke with Camile who gave approval [phone] until 09/24/17. Called to notify pharmacy.

## 2016-09-28 NOTE — Telephone Encounter (Signed)
Pt came in for an appt today

## 2016-09-29 NOTE — Telephone Encounter (Signed)
noted 

## 2016-10-06 ENCOUNTER — Ambulatory Visit (INDEPENDENT_AMBULATORY_CARE_PROVIDER_SITE_OTHER): Payer: Medicaid Other | Admitting: Psychiatry

## 2016-10-06 ENCOUNTER — Encounter (HOSPITAL_COMMUNITY): Payer: Self-pay | Admitting: Psychiatry

## 2016-10-06 DIAGNOSIS — F332 Major depressive disorder, recurrent severe without psychotic features: Secondary | ICD-10-CM
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
## 2016-10-13 ENCOUNTER — Ambulatory Visit: Payer: Self-pay | Admitting: Urology

## 2016-10-19 DIAGNOSIS — Z0289 Encounter for other administrative examinations: Secondary | ICD-10-CM

## 2016-10-27 ENCOUNTER — Encounter (HOSPITAL_COMMUNITY): Payer: Self-pay | Admitting: Psychiatry

## 2016-10-27 ENCOUNTER — Ambulatory Visit (INDEPENDENT_AMBULATORY_CARE_PROVIDER_SITE_OTHER): Payer: Medicaid Other | Admitting: Psychiatry

## 2016-10-27 VITALS — BP 130/87 | HR 107 | Ht 66.0 in | Wt 213.8 lb

## 2016-10-27 DIAGNOSIS — F332 Major depressive disorder, recurrent severe without psychotic features: Secondary | ICD-10-CM

## 2016-10-27 DIAGNOSIS — Z9889 Other specified postprocedural states: Secondary | ICD-10-CM | POA: Diagnosis not present

## 2016-10-27 DIAGNOSIS — Z8489 Family history of other specified conditions: Secondary | ICD-10-CM

## 2016-10-27 DIAGNOSIS — Z833 Family history of diabetes mellitus: Secondary | ICD-10-CM

## 2016-10-27 DIAGNOSIS — Z818 Family history of other mental and behavioral disorders: Secondary | ICD-10-CM

## 2016-10-27 DIAGNOSIS — Z803 Family history of malignant neoplasm of breast: Secondary | ICD-10-CM | POA: Diagnosis not present

## 2016-10-27 DIAGNOSIS — F17210 Nicotine dependence, cigarettes, uncomplicated: Secondary | ICD-10-CM

## 2016-10-27 DIAGNOSIS — Z8249 Family history of ischemic heart disease and other diseases of the circulatory system: Secondary | ICD-10-CM

## 2016-10-27 MED ORDER — AMPHETAMINE-DEXTROAMPHET ER 30 MG PO CP24: 30.0000 mg | ORAL_CAPSULE | Freq: Every day | ORAL | 0 refills | Status: DC

## 2016-10-27 MED ORDER — CARBAMAZEPINE 200 MG PO TABS: ORAL_TABLET | ORAL | 2 refills | Status: DC

## 2016-10-27 MED ORDER — AMPHETAMINE-DEXTROAMPHET ER 30 MG PO CP24
30.0000 mg | ORAL_CAPSULE | Freq: Every day | ORAL | 0 refills | Status: DC
Start: 2016-10-27 — End: 2016-12-17

## 2016-10-27 MED ORDER — CLONAZEPAM 0.5 MG PO TABS: 0.5000 mg | ORAL_TABLET | Freq: Two times a day (BID) | ORAL | 2 refills | Status: DC

## 2016-10-27 MED ORDER — DULOXETINE HCL 60 MG PO CPEP: 60.0000 mg | ORAL_CAPSULE | Freq: Two times a day (BID) | ORAL | 2 refills | Status: DC
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
## 2016-12-03 ENCOUNTER — Inpatient Hospital Stay (HOSPITAL_COMMUNITY)
Admission: EM | Admit: 2016-12-03 | Discharge: 2016-12-08 | DRG: 812 | Disposition: A | Discharge status: Other Acute Inpatient Hospital | Payer: Medicaid Other | Attending: Internal Medicine | Admitting: Internal Medicine

## 2016-12-03 ENCOUNTER — Ambulatory Visit (INDEPENDENT_AMBULATORY_CARE_PROVIDER_SITE_OTHER): Payer: Medicaid Other | Admitting: Family Medicine

## 2016-12-03 ENCOUNTER — Encounter (HOSPITAL_COMMUNITY): Payer: Self-pay | Admitting: Emergency Medicine

## 2016-12-03 ENCOUNTER — Encounter: Payer: Self-pay | Admitting: Family Medicine

## 2016-12-03 ENCOUNTER — Emergency Department (HOSPITAL_COMMUNITY): Payer: Medicaid Other

## 2016-12-03 VITALS — BP 108/76 | Temp 97.9°F | Wt 202.0 lb

## 2016-12-03 DIAGNOSIS — Z91048 Other nonmedicinal substance allergy status: Secondary | ICD-10-CM

## 2016-12-03 DIAGNOSIS — Z888 Allergy status to other drugs, medicaments and biological substances status: Secondary | ICD-10-CM

## 2016-12-03 DIAGNOSIS — R112 Nausea with vomiting, unspecified: Secondary | ICD-10-CM | POA: Diagnosis present

## 2016-12-03 DIAGNOSIS — N920 Excessive and frequent menstruation with regular cycle: Secondary | ICD-10-CM | POA: Diagnosis present

## 2016-12-03 DIAGNOSIS — D649 Anemia, unspecified: Secondary | ICD-10-CM | POA: Diagnosis present

## 2016-12-03 DIAGNOSIS — R7989 Other specified abnormal findings of blood chemistry: Secondary | ICD-10-CM

## 2016-12-03 DIAGNOSIS — R1031 Right lower quadrant pain: Secondary | ICD-10-CM | POA: Diagnosis present

## 2016-12-03 DIAGNOSIS — R1110 Vomiting, unspecified: Secondary | ICD-10-CM

## 2016-12-03 DIAGNOSIS — Z79899 Other long term (current) drug therapy: Secondary | ICD-10-CM

## 2016-12-03 DIAGNOSIS — D500 Iron deficiency anemia secondary to blood loss (chronic): Principal | ICD-10-CM | POA: Diagnosis present

## 2016-12-03 DIAGNOSIS — K580 Irritable bowel syndrome with diarrhea: Secondary | ICD-10-CM | POA: Diagnosis present

## 2016-12-03 DIAGNOSIS — R197 Diarrhea, unspecified: Secondary | ICD-10-CM

## 2016-12-03 DIAGNOSIS — G43709 Chronic migraine without aura, not intractable, without status migrainosus: Secondary | ICD-10-CM | POA: Diagnosis present

## 2016-12-03 DIAGNOSIS — F988 Other specified behavioral and emotional disorders with onset usually occurring in childhood and adolescence: Secondary | ICD-10-CM | POA: Diagnosis present

## 2016-12-03 DIAGNOSIS — R569 Unspecified convulsions: Secondary | ICD-10-CM | POA: Diagnosis present

## 2016-12-03 DIAGNOSIS — R945 Abnormal results of liver function studies: Secondary | ICD-10-CM

## 2016-12-03 DIAGNOSIS — F900 Attention-deficit hyperactivity disorder, predominantly inattentive type: Secondary | ICD-10-CM | POA: Diagnosis present

## 2016-12-03 DIAGNOSIS — Z9884 Bariatric surgery status: Secondary | ICD-10-CM

## 2016-12-03 DIAGNOSIS — E538 Deficiency of other specified B group vitamins: Secondary | ICD-10-CM | POA: Diagnosis present

## 2016-12-03 DIAGNOSIS — D508 Other iron deficiency anemias: Secondary | ICD-10-CM

## 2016-12-03 DIAGNOSIS — R748 Abnormal levels of other serum enzymes: Secondary | ICD-10-CM | POA: Diagnosis present

## 2016-12-03 DIAGNOSIS — F445 Conversion disorder with seizures or convulsions: Secondary | ICD-10-CM | POA: Diagnosis present

## 2016-12-03 DIAGNOSIS — Z91040 Latex allergy status: Secondary | ICD-10-CM

## 2016-12-03 DIAGNOSIS — Z885 Allergy status to narcotic agent status: Secondary | ICD-10-CM

## 2016-12-03 DIAGNOSIS — R1310 Dysphagia, unspecified: Secondary | ICD-10-CM | POA: Diagnosis present

## 2016-12-03 DIAGNOSIS — Z9049 Acquired absence of other specified parts of digestive tract: Secondary | ICD-10-CM

## 2016-12-03 DIAGNOSIS — K2970 Gastritis, unspecified, without bleeding: Secondary | ICD-10-CM | POA: Diagnosis present

## 2016-12-03 DIAGNOSIS — R109 Unspecified abdominal pain: Secondary | ICD-10-CM

## 2016-12-03 DIAGNOSIS — F17210 Nicotine dependence, cigarettes, uncomplicated: Secondary | ICD-10-CM | POA: Diagnosis present

## 2016-12-03 DIAGNOSIS — G894 Chronic pain syndrome: Secondary | ICD-10-CM

## 2016-12-03 DIAGNOSIS — G43909 Migraine, unspecified, not intractable, without status migrainosus: Secondary | ICD-10-CM | POA: Diagnosis present

## 2016-12-03 LAB — CBC WITH DIFFERENTIAL/PLATELET
BASOS PCT: 0 %
Basophils Absolute: 0 10*3/uL (ref 0.0–0.1)
EOS ABS: 0.1 10*3/uL (ref 0.0–0.7)
EOS PCT: 2 %
HCT: 25.9 % — ABNORMAL LOW (ref 36.0–46.0)
HEMOGLOBIN: 7.7 g/dL — AB (ref 12.0–15.0)
LYMPHS ABS: 2 10*3/uL (ref 0.7–4.0)
Lymphocytes Relative: 30 %
MCH: 23 pg — AB (ref 26.0–34.0)
MCHC: 29.7 g/dL — AB (ref 30.0–36.0)
MCV: 77.3 fL — ABNORMAL LOW (ref 78.0–100.0)
MONO ABS: 0.6 10*3/uL (ref 0.1–1.0)
MONOS PCT: 9 %
NEUTROS PCT: 59 %
Neutro Abs: 4.1 10*3/uL (ref 1.7–7.7)
PLATELETS: 317 10*3/uL (ref 150–400)
RBC: 3.35 MIL/uL — ABNORMAL LOW (ref 3.87–5.11)
RDW: 16.4 % — AB (ref 11.5–15.5)
WBC: 6.9 10*3/uL (ref 4.0–10.5)

## 2016-12-03 LAB — COMPREHENSIVE METABOLIC PANEL
ALBUMIN: 3.7 g/dL (ref 3.5–5.0)
ALK PHOS: 70 U/L (ref 38–126)
ALT: 23 U/L (ref 14–54)
ANION GAP: 6 (ref 5–15)
AST: 20 U/L (ref 15–41)
BUN: 14 mg/dL (ref 6–20)
CALCIUM: 8.5 mg/dL — AB (ref 8.9–10.3)
CO2: 21 mmol/L — AB (ref 22–32)
Chloride: 109 mmol/L (ref 101–111)
Creatinine, Ser: 0.55 mg/dL (ref 0.44–1.00)
GFR calc non Af Amer: >60 mL/min (ref >60)
GLUCOSE: 92 mg/dL (ref 65–99)
POTASSIUM: 3.3 mmol/L — AB (ref 3.5–5.1)
SODIUM: 136 mmol/L (ref 135–145)
TOTAL PROTEIN: 6.9 g/dL (ref 6.5–8.1)
Total Bilirubin: 0.4 mg/dL (ref 0.3–1.2)

## 2016-12-03 LAB — LACTIC ACID, PLASMA: Lactic Acid, Venous: 0.9 mmol/L (ref 0.5–1.9)

## 2016-12-03 LAB — URINALYSIS, ROUTINE W REFLEX MICROSCOPIC
BILIRUBIN URINE: NEGATIVE
GLUCOSE, UA: NEGATIVE mg/dL
Hgb urine dipstick: NEGATIVE
KETONES UR: NEGATIVE mg/dL
Leukocytes, UA: NEGATIVE
Nitrite: NEGATIVE
PH: 5 (ref 5.0–8.0)
PROTEIN: NEGATIVE mg/dL
Specific Gravity, Urine: 1.008 (ref 1.005–1.030)

## 2016-12-03 LAB — FERRITIN: Ferritin: 2 ng/mL — ABNORMAL LOW (ref 11–307)

## 2016-12-03 LAB — IRON AND TIBC
IRON: 11 ug/dL — AB (ref 28–170)
Saturation Ratios: 2 % — ABNORMAL LOW (ref 10.4–31.8)
TIBC: 447 ug/dL (ref 250–450)
UIBC: 436 ug/dL

## 2016-12-03 LAB — FOLATE: FOLATE: 5.6 ng/mL — AB (ref >5.9)

## 2016-12-03 LAB — LIPASE, BLOOD: LIPASE: 31 U/L (ref 11–51)

## 2016-12-03 LAB — RETICULOCYTES
RBC.: 3.35 MIL/uL — ABNORMAL LOW (ref 3.87–5.11)
RETIC COUNT ABSOLUTE: 46.9 10*3/uL (ref 19.0–186.0)
Retic Ct Pct: 1.4 % (ref 0.4–3.1)

## 2016-12-03 LAB — VITAMIN B12: Vitamin B-12: 284 pg/mL (ref 180–914)

## 2016-12-03 LAB — POC URINE PREG, ED: Preg Test, Ur: NEGATIVE

## 2016-12-03 LAB — POCT HEMOGLOBIN: Hemoglobin: 7.3 g/dL — AB (ref 12.2–16.2)

## 2016-12-03 MED ORDER — IOPAMIDOL (ISOVUE-300) INJECTION 61%
100.0000 mL | Freq: Once | INTRAVENOUS | Status: AC | PRN
  Administered 2016-12-03: 100 mL via INTRAVENOUS

## 2016-12-03 MED ORDER — DICYCLOMINE HCL 10 MG PO CAPS
10.0000 mg | ORAL_CAPSULE | Freq: Once | ORAL | Status: AC
  Administered 2016-12-03: 10 mg via ORAL
  Filled 2016-12-03: qty 1

## 2016-12-03 MED ORDER — SODIUM CHLORIDE 0.9 % IV SOLN
Freq: Once | INTRAVENOUS | Status: DC
Start: 2016-12-03 — End: 2016-12-04
  Administered 2016-12-04: 06:00:00 via INTRAVENOUS

## 2016-12-03 MED ORDER — SODIUM CHLORIDE 0.9 % IV BOLUS (SEPSIS)
1000.0000 mL | Freq: Once | INTRAVENOUS | Status: AC
  Administered 2016-12-03: 1000 mL via INTRAVENOUS

## 2016-12-03 MED ORDER — PROMETHAZINE HCL 25 MG/ML IJ SOLN
12.5000 mg | Freq: Once | INTRAMUSCULAR | Status: AC
  Administered 2016-12-03: 12.5 mg via INTRAVENOUS
  Filled 2016-12-03: qty 1

## 2016-12-03 MED ORDER — FOLIC ACID 5 MG/ML IJ SOLN
1.0000 mg | Freq: Once | INTRAMUSCULAR | Status: AC
  Administered 2016-12-04: 1 mg via INTRAVENOUS
  Filled 2016-12-03: qty 0.2

## 2016-12-03 MED ORDER — HYDROMORPHONE HCL 1 MG/ML IJ SOLN
1.0000 mg | Freq: Once | INTRAMUSCULAR | Status: AC
  Administered 2016-12-03: 1 mg via INTRAVENOUS
  Filled 2016-12-03: qty 1

## 2016-12-03 MED ORDER — KETOROLAC TROMETHAMINE 30 MG/ML IJ SOLN
15.0000 mg | Freq: Once | INTRAMUSCULAR | Status: AC
  Administered 2016-12-03: 15 mg via INTRAVENOUS
  Filled 2016-12-03: qty 1

## 2016-12-03 MED ORDER — FENTANYL CITRATE (PF) 100 MCG/2ML IJ SOLN
50.0000 ug | Freq: Once | INTRAMUSCULAR | Status: AC
  Administered 2016-12-03: 50 ug via INTRAVENOUS
  Filled 2016-12-03: qty 2

## 2016-12-03 MED ORDER — LACTATED RINGERS IV BOLUS (SEPSIS)
1000.0000 mL | Freq: Once | INTRAVENOUS | Status: DC
  Administered 2016-12-03: 1000 mL via INTRAVENOUS

## 2016-12-03 NOTE — ED Notes (Signed)
Went to check vital pt not in room

## 2016-12-03 NOTE — Progress Notes (Signed)
   Subjective:    Patient ID: Penny [name], female    DOB: Sep 02, 1986, 30 y.o.   MRN: [id number]  Emesis   This is a new problem. The current episode started 1 to 4 weeks ago. The problem occurs intermittently. The problem has been unchanged. There has been no fever. Associated symptoms include abdominal pain and diarrhea. Associated symptoms comments: Right sided pain, shortness of breath. Treatments tried: phenergan. The treatment provided mild relief.  This patient has long history of intestinal issues In the past she is had problems with dysphagia and had have esophageal stretching She has had a gastric surgeries in the past as well. She relates 2 weeks of abdominal pains and discomfort more in the right side along with feeling hot and cold in addition to this vomiting and multiple episodes of diarrhea she states at times diarrhea is mucousy or and sometimes watery other times it is bloody. She has not been able to keep any liquids down over the past couple weeks. She states that Zofran and Phenergan she is been unable to keep down and she is been unable to keep Phenergan suppository within her.  Patient had 2 separate times during today's examination where she had to get up and go to the bathroom to throw up and or have diarrhea. Patient has no other concerns at this time.   Results for orders placed or performed in visit on 12/03/16  POCT hemoglobin  Result Value Ref Range   Hemoglobin 7.3 (A) 12.2 - 16.2 g/dL   Patient does have a history of iron deficient anemia Review of Systems  HENT: Negative for congestion and ear pain.   Gastrointestinal: Positive for abdominal pain, diarrhea, nausea and vomiting.  Genitourinary: Negative for hematuria.   She does relate she is on her menstrual cycle    Objective:   Physical Exam She looks to feel ill she appears pale she is mildly tachycardic her lungs are clear heart is slightly tachycardic no murmurs abdomen is soft yet significant  tenderness along the right lower abdomen region. Also some mid abdominal tenderness no guarding or rebound but unable to give a good exam because of the severity of her pain   Significant iron deficient anemia    Assessment & Plan:  Severe abdominal pain Possible dehydration Iron deficient anemia Possibility of colitis versus viral gastroenteritis Patient will need to have lab work as well as possible IV fluids nausea and pain control through the ER May end up needing to have CT scan of abdomen We are limited in what we are able to do for her here in the office. Patient agrees to go to the ER for further evaluation. Her mother will take her over.

## 2016-12-03 NOTE — ED Notes (Signed)
Dr. Dayna Barker informed of patients continued complaints of pain, pt states "the fluid made me worse".

## 2016-12-03 NOTE — ED Triage Notes (Signed)
Pt sent from Dr Lance Sell office, pale ,weak, Hemoglobin 7.3, vomiting x 1 weeks, diarrhea, vaginal bleeding x 2 weeks

## 2016-12-04 ENCOUNTER — Observation Stay (HOSPITAL_COMMUNITY): Payer: Medicaid Other

## 2016-12-04 ENCOUNTER — Encounter (HOSPITAL_COMMUNITY): Payer: Self-pay

## 2016-12-04 DIAGNOSIS — N920 Excessive and frequent menstruation with regular cycle: Secondary | ICD-10-CM | POA: Diagnosis present

## 2016-12-04 DIAGNOSIS — Z888 Allergy status to other drugs, medicaments and biological substances status: Secondary | ICD-10-CM | POA: Diagnosis not present

## 2016-12-04 DIAGNOSIS — D649 Anemia, unspecified: Secondary | ICD-10-CM | POA: Diagnosis not present

## 2016-12-04 DIAGNOSIS — G43719 Chronic migraine without aura, intractable, without status migrainosus: Secondary | ICD-10-CM | POA: Diagnosis not present

## 2016-12-04 DIAGNOSIS — Z91040 Latex allergy status: Secondary | ICD-10-CM | POA: Diagnosis not present

## 2016-12-04 DIAGNOSIS — R197 Diarrhea, unspecified: Secondary | ICD-10-CM | POA: Diagnosis not present

## 2016-12-04 DIAGNOSIS — Z9884 Bariatric surgery status: Secondary | ICD-10-CM | POA: Diagnosis not present

## 2016-12-04 DIAGNOSIS — F988 Other specified behavioral and emotional disorders with onset usually occurring in childhood and adolescence: Secondary | ICD-10-CM | POA: Diagnosis present

## 2016-12-04 DIAGNOSIS — K2970 Gastritis, unspecified, without bleeding: Secondary | ICD-10-CM | POA: Diagnosis present

## 2016-12-04 DIAGNOSIS — Z9049 Acquired absence of other specified parts of digestive tract: Secondary | ICD-10-CM | POA: Diagnosis not present

## 2016-12-04 DIAGNOSIS — R112 Nausea with vomiting, unspecified: Secondary | ICD-10-CM | POA: Diagnosis not present

## 2016-12-04 DIAGNOSIS — F445 Conversion disorder with seizures or convulsions: Secondary | ICD-10-CM | POA: Diagnosis present

## 2016-12-04 DIAGNOSIS — R945 Abnormal results of liver function studies: Secondary | ICD-10-CM | POA: Diagnosis not present

## 2016-12-04 DIAGNOSIS — G894 Chronic pain syndrome: Secondary | ICD-10-CM | POA: Diagnosis present

## 2016-12-04 DIAGNOSIS — R1031 Right lower quadrant pain: Secondary | ICD-10-CM

## 2016-12-04 DIAGNOSIS — R1310 Dysphagia, unspecified: Secondary | ICD-10-CM | POA: Diagnosis present

## 2016-12-04 DIAGNOSIS — Z79899 Other long term (current) drug therapy: Secondary | ICD-10-CM | POA: Diagnosis not present

## 2016-12-04 DIAGNOSIS — E538 Deficiency of other specified B group vitamins: Secondary | ICD-10-CM | POA: Diagnosis present

## 2016-12-04 DIAGNOSIS — G43909 Migraine, unspecified, not intractable, without status migrainosus: Secondary | ICD-10-CM | POA: Diagnosis present

## 2016-12-04 DIAGNOSIS — K580 Irritable bowel syndrome with diarrhea: Secondary | ICD-10-CM | POA: Diagnosis present

## 2016-12-04 DIAGNOSIS — Z91048 Other nonmedicinal substance allergy status: Secondary | ICD-10-CM | POA: Diagnosis not present

## 2016-12-04 DIAGNOSIS — F17210 Nicotine dependence, cigarettes, uncomplicated: Secondary | ICD-10-CM | POA: Diagnosis present

## 2016-12-04 DIAGNOSIS — Z885 Allergy status to narcotic agent status: Secondary | ICD-10-CM | POA: Diagnosis not present

## 2016-12-04 DIAGNOSIS — R748 Abnormal levels of other serum enzymes: Secondary | ICD-10-CM | POA: Diagnosis present

## 2016-12-04 DIAGNOSIS — D500 Iron deficiency anemia secondary to blood loss (chronic): Secondary | ICD-10-CM | POA: Diagnosis present

## 2016-12-04 DIAGNOSIS — G43709 Chronic migraine without aura, not intractable, without status migrainosus: Secondary | ICD-10-CM | POA: Diagnosis present

## 2016-12-04 LAB — BASIC METABOLIC PANEL
ANION GAP: 5 (ref 5–15)
BUN: 8 mg/dL (ref 6–20)
CALCIUM: 8.2 mg/dL — AB (ref 8.9–10.3)
CO2: 24 mmol/L (ref 22–32)
Chloride: 109 mmol/L (ref 101–111)
Creatinine, Ser: 0.53 mg/dL (ref 0.44–1.00)
GFR calc Af Amer: >60 mL/min (ref >60)
GLUCOSE: 98 mg/dL (ref 65–99)
Potassium: 3.4 mmol/L — ABNORMAL LOW (ref 3.5–5.1)
Sodium: 138 mmol/L (ref 135–145)

## 2016-12-04 LAB — OCCULT BLOOD X 1 CARD TO LAB, STOOL: FECAL OCCULT BLD: POSITIVE — AB

## 2016-12-04 LAB — CBC
HCT: 29.6 % — ABNORMAL LOW (ref 36.0–46.0)
HEMOGLOBIN: 9.3 g/dL — AB (ref 12.0–15.0)
MCH: 25.1 pg — ABNORMAL LOW (ref 26.0–34.0)
MCHC: 31.4 g/dL (ref 30.0–36.0)
MCV: 80 fL (ref 78.0–100.0)
PLATELETS: 262 10*3/uL (ref 150–400)
RBC: 3.7 MIL/uL — ABNORMAL LOW (ref 3.87–5.11)
RDW: 16.4 % — AB (ref 11.5–15.5)
WBC: 4.7 10*3/uL (ref 4.0–10.5)

## 2016-12-04 LAB — PREPARE RBC (CROSSMATCH)

## 2016-12-04 MED ORDER — ACETAMINOPHEN 650 MG RE SUPP: 650.0000 mg | Freq: Four times a day (QID) | RECTAL | Status: DC | PRN

## 2016-12-04 MED ORDER — ACETAMINOPHEN 325 MG PO TABS: 650.0000 mg | ORAL_TABLET | Freq: Four times a day (QID) | ORAL | Status: DC | PRN

## 2016-12-04 MED ORDER — HYDROMORPHONE HCL 1 MG/ML IJ SOLN
0.5000 mg | INTRAMUSCULAR | Status: DC | PRN
  Administered 2016-12-04 – 2016-12-06 (×13): 0.5 mg via INTRAVENOUS
  Filled 2016-12-04 (×15): qty 1

## 2016-12-04 MED ORDER — CYANOCOBALAMIN 1000 MCG/ML IJ SOLN
1000.0000 ug | Freq: Once | INTRAMUSCULAR | Status: AC
  Administered 2016-12-04: 1000 ug via INTRAMUSCULAR
  Filled 2016-12-04: qty 1

## 2016-12-04 MED ORDER — FOLIC ACID 1 MG PO TABS
1.0000 mg | ORAL_TABLET | Freq: Every day | ORAL | Status: DC
  Administered 2016-12-04 – 2016-12-08 (×5): 1 mg via ORAL
  Filled 2016-12-04 (×5): qty 1

## 2016-12-04 MED ORDER — FOLIC ACID 5 MG/ML IJ SOLN
1.0000 mg | Freq: Once | INTRAMUSCULAR | Status: AC
  Administered 2016-12-04: 1 mg via INTRAVENOUS
  Filled 2016-12-04 (×2): qty 0.2

## 2016-12-04 MED ORDER — AMPHETAMINE-DEXTROAMPHET ER 30 MG PO CP24
30.0000 mg | ORAL_CAPSULE | Freq: Every day | ORAL | Status: DC
Start: 2016-12-04 — End: 2016-12-09
  Filled 2016-12-04 (×4): qty 1

## 2016-12-04 MED ORDER — TOPIRAMATE 25 MG PO TABS
50.0000 mg | ORAL_TABLET | Freq: Every day | ORAL | Status: DC | PRN
  Administered 2016-12-04 – 2016-12-06 (×2): 50 mg via ORAL
  Filled 2016-12-04: qty 2

## 2016-12-04 MED ORDER — SODIUM CHLORIDE 0.9 % IV SOLN
INTRAVENOUS | Status: AC
  Administered 2016-12-04 (×2): via INTRAVENOUS

## 2016-12-04 MED ORDER — TOPIRAMATE 100 MG PO TABS
100.0000 mg | ORAL_TABLET | Freq: Every day | ORAL | Status: DC | PRN
  Administered 2016-12-05 – 2016-12-06 (×2): 100 mg via ORAL
  Filled 2016-12-04 (×3): qty 1

## 2016-12-04 MED ORDER — PROMETHAZINE HCL 25 MG/ML IJ SOLN
12.5000 mg | Freq: Four times a day (QID) | INTRAMUSCULAR | Status: DC | PRN
  Administered 2016-12-04 – 2016-12-08 (×14): 12.5 mg via INTRAVENOUS
  Filled 2016-12-04 (×17): qty 1

## 2016-12-04 MED ORDER — TIZANIDINE HCL 4 MG PO TABS
2.0000 mg | ORAL_TABLET | Freq: Three times a day (TID) | ORAL | Status: DC | PRN
  Administered 2016-12-04 – 2016-12-08 (×9): 2 mg via ORAL
  Filled 2016-12-04 (×9): qty 1

## 2016-12-04 MED ORDER — CARBAMAZEPINE 200 MG PO TABS
400.0000 mg | ORAL_TABLET | Freq: Every day | ORAL | Status: DC
  Administered 2016-12-04 – 2016-12-08 (×5): 400 mg via ORAL
  Filled 2016-12-04 (×6): qty 2

## 2016-12-04 MED ORDER — DULOXETINE HCL 60 MG PO CPEP
60.0000 mg | ORAL_CAPSULE | Freq: Two times a day (BID) | ORAL | Status: DC
Start: 2016-12-04 — End: 2016-12-09
  Administered 2016-12-04 – 2016-12-08 (×11): 60 mg via ORAL
  Filled 2016-12-04 (×11): qty 1

## 2016-12-04 MED ORDER — ALBUTEROL SULFATE (2.5 MG/3ML) 0.083% IN NEBU: 3.0000 mL | INHALATION_SOLUTION | Freq: Four times a day (QID) | RESPIRATORY_TRACT | Status: DC | PRN

## 2016-12-04 MED ORDER — CLONAZEPAM 0.5 MG PO TABS
0.5000 mg | ORAL_TABLET | Freq: Two times a day (BID) | ORAL | Status: DC
Start: 2016-12-04 — End: 2016-12-09
  Administered 2016-12-04 – 2016-12-08 (×11): 0.5 mg via ORAL
  Filled 2016-12-04 (×11): qty 1

## 2016-12-04 MED ORDER — FENTANYL CITRATE (PF) 100 MCG/2ML IJ SOLN
25.0000 ug | INTRAMUSCULAR | Status: DC | PRN
  Administered 2016-12-04: 25 ug via INTRAVENOUS
  Filled 2016-12-04: qty 2

## 2016-12-04 MED ORDER — PANTOPRAZOLE SODIUM 40 MG PO TBEC
40.0000 mg | DELAYED_RELEASE_TABLET | Freq: Every day | ORAL | Status: DC
  Administered 2016-12-04 – 2016-12-07 (×4): 40 mg via ORAL
  Filled 2016-12-04 (×4): qty 1

## 2016-12-04 NOTE — ED Provider Notes (Signed)
[facility] DEPT Provider Note   CSN: [id number] Arrival date & time: 12/03/16  1708     History   Chief Complaint Chief Complaint  Patient presents with  . Abnormal Lab    HPI Penny [name] is a 30 y.o. female.   Abnormal Lab  Time since result:  Hours Patient referred by:  PCP Resulting agency:  Internal Result type: hematology   Hematology:    Hemoglobin:  Low Weakness  Primary symptoms comment: diffuse. This is a new problem. The current episode started less than 1 hour ago. There was no focality noted. There has been no fever. Associated symptoms include vomiting. Pertinent negatives include no shortness of breath.  Abdominal Pain   This is a new problem. The current episode started more than 1 week ago. The problem occurs constantly. The problem has not changed since onset.Associated symptoms include vomiting. Pertinent negatives include nausea.    Past Medical History:  Diagnosis Date  . ADD (attention deficit disorder)   . Anemia 2011   2o to GASTRIC BYPASS  . Anginal pain ([place])   . Anxiety   . Blood transfusion without reported diagnosis   . Chronic daily headache   . Depression   . Depression   . Dysmenorrhea 09/18/2013  . Dysrhythmia   . Elevated liver enzymes JUL 2011ALK PHOS 111-127 AST  143-267 ALT  213-321T BILI 0.6  ALB  3.7-4.06 Jun 2011 ALK PHOS 118 AST 24 ALT 42 T BILI 0.4 ALB 3.9  . Encounter for drug rehabilitation    at behavioral health for opioid addiction about 4 years ago  . Family history of adverse reaction to anesthesia    'dad had to be kept on pump for breathing for morphine'  . Fatty liver   . Fibromyalgia   . Gastric bypass status for obesity   . Gastritis JULY 2011  . Heart murmur   . 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 History of Holter monitoring   . Hx of opioid abuse    for about 4 years, about 4 years ago  . Interstitial cystitis   . Iron deficiency anemia 07/23/2010  . Irritable bowel  syndrome 2012 DIARRHEA   JUN 2012 TTG IgA 14.9  . IUD FEB 2010  . Lupus   . Menorrhagia 07/18/2013  . Migraines   . Obesity (BMI 30-39.9) 2011 228 LBS BMI 36.8  . Ovarian cyst   . Patient desires pregnancy 09/18/2013  . PONV (postoperative nausea and vomiting) seizure post-operatively  . Potassium (K) deficiency   . Pregnant 12/25/2013  . Psychiatric pseudoseizure   . RLQ abdominal pain 07/18/2013  . Sciatica of left side 11/14/2014  . 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 Stress 09/18/2013    Patient Active Problem List   Diagnosis Date Noted  . Symptomatic anemia 12/03/2016  . Complaints of leg weakness   . Paresthesia   . Left-sided weakness   . Uncontrolled pain 01/24/2016  . Malnutrition of moderate degree 01/24/2016  . Neuropathy ([place]) 01/23/2016  . Inability to walk 01/23/2016  . Paresthesias   . Bilateral leg numbness 11/26/2015  . 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 Post partum depression 05/09/2015  . Anastomotic ulcer   . Dysphagia   . Hematemesis 04/07/2015  . Intractable vomiting with nausea 04/07/2015  . Elevated liver enzymes   . Epigastric pain   . Pseudoseizure ([place]) 02/22/2015  . Seizure-like activity ([place]) 02/22/2015  .  Fibromyalgia 02/18/2015  . Vulvar fissure 02/18/2015  . Clinical depression 02/01/2015  . Current smoker 01/29/2015  . Current tobacco use 01/29/2015  . 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 Leg weakness, bilateral 01/02/2015  . Gait disorder 01/02/2015  . Other symptoms and signs involving the musculoskeletal system 01/02/2015  . Abnormal gait 01/02/2015  . Cervicalgia 12/18/2014  . Sciatica of left side 11/14/2014  . Neuralgia neuritis, sciatic nerve 11/14/2014  . 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 Family planning 09/09/2014  . Headache, migraine 08/19/2014  . Seizure ([place]) 08/18/2014  . Encephalopathy 08/13/2014  . Headache 08/13/2014  .  Altered mental status 08/13/2014  . Right leg numbness 07/31/2014  . Disturbance of skin sensation 07/31/2014  . Hypertension in pregnancy, transient 07/08/2014  . Previous gastric bypass affecting pregnancy, antepartum 05/22/2014  . 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 ASD (atrial septal defect), ostium secundum 05/17/2014  . Depression complicating pregnancy in second trimester, antepartum 05/09/2014  . Antepartum mental disorder in pregnancy 05/09/2014  . Rapid palpitations 02/18/2014  . H/O maternal third degree perineal laceration, currently pregnant 02/18/2014  . High-risk pregnancy 02/18/2014  . Supervision of pregnancy with other poor reproductive or obstetric history, unspecified trimester 02/18/2014  . Maternal iron deficiency anemia complicating pregnancy 78/67/6720  . Restless legs 01/16/2014  . Restless leg 01/16/2014  . Chronic interstitial cystitis 10/23/2013  . Menorrhagia 07/18/2013  . Excessive and frequent menstruation 07/18/2013  . h/o Opiate addiction 03/11/2012    Class: Acute  . History of migraine headaches 03/10/2012    Class: Acute  . Depression with anxiety 03/10/2012    Class: Chronic  . 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 Panic disorder without agoraphobia 03/10/2012  . H/O disease 03/10/2012  . Dysthymia 03/10/2012  . Pelvic congestion syndrome 10/13/2011  . Coitalgia 10/13/2011  . Chronic migraine without aura 10/13/2011  . Unspecified dyspareunia 10/13/2011  . IBS (irritable bowel syndrome) 08/25/2011  . Diarrhea 05/27/2011  . OBESITY, UNSPECIFIED 09/17/2010  . Transaminitis 09/17/2010  . Iron deficiency anemia 07/23/2010    Past Surgical History:  Procedure Laterality Date  . BIOPSY  04/10/2015   Procedure: BIOPSY;  Surgeon: Daneil Dolin, MD;  Location: [facility];  Service:  Endoscopy;;  . cath self every nite     for sodium bicarb injection (discontinued 2013)  . CHOLECYSTECTOMY  2005   biliary dyskinesia  . COLONOSCOPY  JUN 2012 ABD PN/DIARRHEA WITH PROPOFOL   NL COLON  . DILATION AND CURETTAGE OF UTERUS    . ESOPHAGEAL DILATION N/A 04/10/2015   Procedure: ESOPHAGEAL DILATION WITH 54FR MALONEY DILATOR;  Surgeon: Daneil Dolin, MD;  Location: [facility];  Service: Endoscopy;  Laterality: N/A;  . ESOPHAGOGASTRODUODENOSCOPY    . ESOPHAGOGASTRODUODENOSCOPY (EGD) WITH PROPOFOL N/A 04/10/2015   Procedure: ESOPHAGOGASTRODUODENOSCOPY (EGD) WITH PROPOFOL;  Surgeon: Daneil Dolin, MD;  Location: [facility];  Service: Endoscopy;  Laterality: N/A;  . GAB  2007   in [place]-POUCH 5 CM  . GASTRIC BYPASS  06/2006  . HYSTEROSCOPY W/D&C N/A 09/12/2014   Procedure: DILATATION AND CURETTAGE /HYSTEROSCOPY;  Surgeon: Jonnie Kind, MD;  Location: [facility];  Service: Gynecology;  Laterality: N/A;  . REPAIR VAGINAL CUFF N/A 07/30/2014   Procedure: REPAIR VAGINAL CUFF;  Surgeon: Mora Bellman, MD;  Location: [facility];  Service: Gynecology;  Laterality: N/A;  .  SAVORY DILATION  06/20/2012   Dr. Barnie Alderman gastritis/Ulcer in the mid jejunum. Empiric dilation.   . SURAL NERVE BX Left 02/25/2016   Procedure: LEFT SURAL NERVE BIOPSY;  Surgeon: Jovita Gamma, MD;  Location: [facility];  Service: Neurosurgery;  Laterality: Left;  Left sural nerve biopsy  . TONSILLECTOMY    . TONSILLECTOMY AND ADENOIDECTOMY    . UPPER GASTROINTESTINAL ENDOSCOPY  JULY 2011 NAUSEA-D125,V6, PH 25   Bx; GASTRITIS, POUCH-5 CM LONG  . WISDOM TOOTH EXTRACTION      OB History    Gravida Para Term Preterm AB Living   '3 2 1 1 1 2   '$ SAB TAB Ectopic Multiple Live Births   1       2       Home Medications    Prior to Admission medications   Medication Sig Start Date End Date Taking? Authorizing Provider  albuterol (PROVENTIL HFA;VENTOLIN HFA) 108 (90 Base) MCG/ACT inhaler Inhale 2 puffs into the lungs every 6  (six) hours as needed for wheezing. 05/28/16  Yes Kathyrn Drown, MD  amphetamine-dextroamphetamine (ADDERALL XR) 30 MG 24 hr capsule Take 1 capsule (30 mg total) by mouth daily. 10/27/16  Yes Cloria Spring, MD  APAP-Pamabrom-Pyrilamine (MENSTRUAL PAIN RELIEF) 500-25-15 MG TABS Take 1-2 tablets by mouth as needed (for pain). Reported on 06/09/2016   Yes Historical Provider, MD  carbamazepine (TEGRETOL) 200 MG tablet Take two tablets at bedtime Patient taking differently: Take 400 mg by mouth at bedtime.  10/27/16  Yes Cloria Spring, MD  clonazePAM (KLONOPIN) 0.5 MG tablet Take 1 tablet (0.5 mg total) by mouth 2 (two) times daily. 10/27/16 10/27/17 Yes Cloria Spring, MD  DULoxetine (CYMBALTA) 60 MG capsule Take 1 capsule (60 mg total) by mouth 2 (two) times daily. 10/27/16  Yes Cloria Spring, MD  HYDROcodone-acetaminophen [facility]) 10-325 MG tablet Take 1 tablet by mouth every 6 (six) hours as needed. 09/28/16  Yes Cloria Spring, MD  pantoprazole (PROTONIX) 40 MG tablet Take 1 tablet (40 mg total) by mouth daily. 06/10/16  Yes Kathyrn Drown, MD  tiZANidine (ZANAFLEX) 2 MG tablet Take 1 tablet by mouth TID  prn Patient taking differently: Take 2 mg by mouth every 8 (eight) hours as needed for muscle spasms.  09/02/16  Yes Kathyrn Drown, MD  topiramate (TOPAMAX) 50 MG tablet TAKE 1 TABLET BY MOUTH EACH MORNING AND 2 TABLETS IN THE EVENING AS NEEDED FOR MIGRAINES. Patient taking differently: Take 50-100 mg by mouth 2 (two) times daily. TAKE 1 TABLET BY MOUTH EACH MORNING AND 2 TABLETS IN THE EVENING AS NEEDED FOR MIGRAINES. 03/09/16  Yes Kathyrn Drown, MD  amphetamine-dextroamphetamine (ADDERALL XR) 30 MG 24 hr capsule Take 1 capsule (30 mg total) by mouth daily. Patient not taking: Reported on 12/03/2016 10/27/16   Cloria Spring, MD    Family History Family History  Problem Relation Age of Onset  . Hemochromatosis Maternal Grandmother   . Migraines Maternal Grandmother   . Cancer Maternal  Grandmother   . Breast cancer Maternal Grandmother   . Hypertension Father   . Diabetes Father   . Coronary artery disease Father   . Migraines Paternal Grandmother   . Breast cancer Paternal Grandmother   . Cancer Mother     breast  . Hemochromatosis Mother   . Breast cancer Mother   . Depression Mother   . Anxiety disorder Mother   . Coronary artery disease Paternal Grandfather   .  Anxiety disorder Brother   . Bipolar disorder Brother   . Healthy Son   . Healthy Daughter     Social History Social History  Substance Use Topics  . Smoking status: Current Every Day Smoker    Packs/day: 0.10    Years: 6.00    Types: Cigarettes  . Smokeless tobacco: Never Used     Comment: Patient is down to 1 a day and quiting on her own  . Alcohol use 0.0 oz/week     Comment: once every weeks - maybe a beer with a dinner, rarely     Allergies   Gabapentin; Metoclopramide hcl; Tramadol; Morphine and related; Nucynta [tapentadol]; Propofol; Trazodone and nefazodone; Zofran [ondansetron]; Butrans [buprenorphine]; Latex; Lyrica [pregabalin]; and Tape   Review of Systems Review of Systems  Respiratory: Negative for shortness of breath.   Gastrointestinal: Positive for abdominal pain and vomiting. Negative for nausea.  Neurological: Positive for weakness.  All other systems reviewed and are negative.    Physical Exam Updated Vital Signs BP 106/65   Pulse 72   Resp 11   Ht '5\' 6"'$  (1.676 m)   Wt 220 lb (99.8 kg)   LMP 11/22/2016   SpO2 97%   BMI 35.51 kg/m   Physical Exam  Constitutional: She is oriented to person, place, and time. She appears well-developed and well-nourished.  HENT:  Head: Normocephalic and atraumatic.  Eyes: Conjunctivae and EOM are normal.  Neck: Normal range of motion.  Cardiovascular: Normal rate and regular rhythm.   Pulmonary/Chest: Effort normal. No stridor. No respiratory distress.  Abdominal: Soft. She exhibits no distension. There is tenderness  (RLQ).  Musculoskeletal: Normal range of motion. She exhibits no edema or deformity.  Neurological: She is alert and oriented to person, place, and time.  Skin: Skin is warm and dry.  Nursing note and vitals reviewed.    ED Treatments / Results  Labs (all labs ordered are listed, but only abnormal results are displayed) Labs Reviewed  CBC WITH DIFFERENTIAL/PLATELET - Abnormal; Notable for the following:       Result Value   RBC 3.35 (*)    Hemoglobin 7.7 (*)    HCT 25.9 (*)    MCV 77.3 (*)    MCH 23.0 (*)    MCHC 29.7 (*)    RDW 16.4 (*)    All other components within normal limits  COMPREHENSIVE METABOLIC PANEL - Abnormal; Notable for the following:    Potassium 3.3 (*)    CO2 21 (*)    Calcium 8.5 (*)    All other components within normal limits  FOLATE - Abnormal; Notable for the following:    Folate 5.6 (*)    All other components within normal limits  IRON AND TIBC - Abnormal; Notable for the following:    Iron 11 (*)    Saturation Ratios 2 (*)    All other components within normal limits  FERRITIN - Abnormal; Notable for the following:    Ferritin 2 (*)    All other components within normal limits  RETICULOCYTES - Abnormal; Notable for the following:    RBC. 3.35 (*)    All other components within normal limits  URINALYSIS, ROUTINE W REFLEX MICROSCOPIC - Abnormal; Notable for the following:    Color, Urine STRAW (*)    All other components within normal limits  VITAMIN B12  LIPASE, BLOOD  LACTIC ACID, PLASMA  POC URINE PREG, ED  TYPE AND SCREEN  PREPARE RBC (CROSSMATCH)  EKG  EKG Interpretation  Date/Time:  Friday December 03 2016 17:35:46 EST Ventricular Rate:  80 PR Interval:    QRS Duration: 67 QT Interval:  391 QTC Calculation: 451 R Axis:   44 Text Interpretation:  Sinus rhythm Probable left atrial enlargement Low voltage, precordial leads No significant change since last tracing Confirmed by [facility] MD, Corene Cornea [phone]) on 12/03/2016 7:04:00 PM        Radiology Ct Abdomen Pelvis W Contrast  Result Date: 12/03/2016 CLINICAL DATA:  Oral contrast per protocol; RLQ pain; Pt sent from Dr Lance Sell office, pale ,weak, Hemoglobin 7.3, vomiting x 1 weeks, diarrhea, vaginal bleeding x 2 weeks^181m ISOVUE-300 IOPAMIDOL (ISOVUE-300) INJECTION 61% EXAM: CT ABDOMEN AND PELVIS WITH CONTRAST TECHNIQUE: Multidetector CT imaging of the abdomen and pelvis was performed using the standard protocol following bolus administration of intravenous contrast. CONTRAST:  1063mISOVUE-300 IOPAMIDOL (ISOVUE-300) INJECTION 61% COMPARISON:  06/15/2016 FINDINGS: Lower chest: No pulmonary nodules, pleural effusions, or infiltrates. Heart size is normal. No imaged pericardial effusion or significant coronary artery calcifications. Hepatobiliary: Status post cholecystectomy. Mild intrahepatic biliary duct dilatation. Common bile duct is prominent, measuring 1.3 cm. Dilatation is increased since prior study on 04/07/2015. Pancreas: Unremarkable. No pancreatic ductal dilatation or surrounding inflammatory changes. Spleen: Normal in size without focal abnormality. Adrenals/Urinary Tract: Adrenal glands are unremarkable. Kidneys are normal, without renal calculi, focal lesion, or hydronephrosis. Bladder is unremarkable. Stomach/Bowel: Status post gastric bypass surgery, unremarkable in appearance. Small bowel loops are unremarkable in appearance. The appendix is well seen and has a normal appearance. Early contrast filling of the large bowel limits evaluation of the colon. However, no focal abnormality identified. Vascular/Lymphatic: No significant vascular findings are present. Numerous small, nonspecific mesenteric lymph nodes are present. Reproductive: The uterus is present.  No adnexal mass. Other: No abdominal wall hernia or abnormality. No abdominopelvic ascites. Musculoskeletal: No acute or significant osseous findings. IMPRESSION: 1. Status post cholecystectomy. 2. Mild intra and  extrahepatic biliary duct dilatation may be explained by the patient's postoperative state but is more prominent than typical and has increased. Correlation with hepatic enzymes recommended. Consider MRCP for further evaluation. 3. Uncomplicated appearance of gastric bypass surgery. 4. Small nonspecific mesenteric lymph nodes. Electronically Signed   By: ElNolon Nations.D.   On: 12/03/2016 21:57    Procedures Procedures (including critical care time)  Medications Ordered in ED Medications  0.9 %  sodium chloride infusion (not administered)  sodium chloride 0.9 % bolus 1,000 mL (0 mLs Intravenous Stopped 12/03/16 2002)  fentaNYL (SUBLIMAZE) injection 50 mcg (50 mcg Intravenous Given 12/03/16 1837)  promethazine (PHENERGAN) injection 12.5 mg (12.5 mg Intravenous Given 12/03/16 1837)  HYDROmorphone (DILAUDID) injection 1 mg (1 mg Intravenous Given 12/03/16 2009)  iopamidol (ISOVUE-300) 61 % injection 100 mL (100 mLs Intravenous Contrast Given 12/03/16 2054)  ketorolac (TORADOL) 30 MG/ML injection 15 mg (15 mg Intravenous Given 12/03/16 2208)  dicyclomine (BENTYL) capsule 10 mg (10 mg Oral Given 12/03/16 2208)  HYDROmorphone (DILAUDID) injection 1 mg (1 mg Intravenous Given 1249/70/2623785 folic acid injection 1 mg (1 mg Intravenous Given 12/04/16 0032)     Initial Impression / Assessment and Plan / ED Course  I have reviewed the triage vital signs and the nursing notes.  Pertinent labs & imaging results that were available during my care of the patient were reviewed by me and considered in my medical decision making (see chart for details).  Clinical Course     RLQ ttp, ct without abnormality aside from  hepatic ductal dilatiation, may need MRCP.  Anemic, symptomatic at home (sob on exertion, weakness, fatigue) and pallor on exam. Transfusion started. Will admit for same.   Final Clinical Impressions(s) / ED Diagnoses   Final diagnoses:  Symptomatic anemia    New  Prescriptions New Prescriptions   No medications on file     Merrily Pew, MD 12/04/16 [phone]

## 2016-12-04 NOTE — H&P (Signed)
History and Physical    Penny [name] GUR:[id number] DOB: 1986-06-13 DOA: 12/03/2016  PCP: [name], MD  Patient coming from: Home.  Chief Complaint: Low hemoglobin. Abdominal pain.  HPI: Penny [name] is a 30 y.o. female with history of gastric bypass, pseudoseizures, migraine and depression was referred to the ER because of low hemoglobin as seen in patients primary care office. Patient also has been having increasing weakness over the last 2-3 weeks with persistent nausea vomiting and diarrhea and abdominal pain. Abdominal pain is mostly in the lower quadrants. LFTs were normal. CT abdomen and pelvis shows intra-and extrahepatic biliary duct dilatation. Since patient has symptomatic anemia with hemoglobin drop of 3 g from July patient is admitted for symptomatic anemia and further management of nausea vomiting and diarrhea with abdominal discomfort. Patient denies any blood in the vomit or diarrhea. Has been having menorrhagia.  Labs also revealed iron deficiency along with folate and borderline B-12 levels.   Patient has had an EGD last year which was essentially normal.  ED Course: Was started on 2 units packed red blood cell transfusion due to symptomatic anemia.  Review of Systems: As per HPI, rest all negative.   Past Medical History:  Diagnosis Date  . ADD (attention deficit disorder)   . Anemia 2011   2o to GASTRIC BYPASS  . Anginal pain ([place])   . Anxiety   . Blood transfusion without reported diagnosis   . Chronic daily headache   . Depression   . Depression   . Dysmenorrhea 09/18/2013  . Dysrhythmia   . Elevated liver enzymes JUL 2011ALK PHOS 111-127 AST  143-267 ALT  213-321T BILI 0.6  ALB  3.7-4.06 Jun 2011 ALK PHOS 118 AST 24 ALT 42 T BILI 0.4 ALB 3.9  . Encounter for drug rehabilitation    at behavioral health for opioid addiction about 4 years ago  . Family history of adverse reaction to anesthesia    'dad had to be kept on pump for breathing for  morphine'  . Fatty liver   . Fibromyalgia   . Gastric bypass status for obesity   . Gastritis JULY 2011  . Heart murmur   . 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 History of Holter monitoring   . Hx of opioid abuse    for about 4 years, about 4 years ago  . Interstitial cystitis   . Iron deficiency anemia 07/23/2010  . Irritable bowel syndrome 2012 DIARRHEA   JUN 2012 TTG IgA 14.9  . IUD FEB 2010  . Lupus   . Menorrhagia 07/18/2013  . Migraines   . Obesity (BMI 30-39.9) 2011 228 LBS BMI 36.8  . Ovarian cyst   . Patient desires pregnancy 09/18/2013  . PONV (postoperative nausea and vomiting) seizure post-operatively  . Potassium (K) deficiency   . Pregnant 12/25/2013  . Psychiatric pseudoseizure   . RLQ abdominal pain 07/18/2013  . Sciatica of left side 11/14/2014  . 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 Stress 09/18/2013    Past Surgical History:  Procedure Laterality Date  . BIOPSY  04/10/2015   Procedure: BIOPSY;  Surgeon: Daneil Dolin, MD;  Location: [facility];  Service: Endoscopy;;  . cath self every nite     for sodium bicarb injection (discontinued 2013)  . CHOLECYSTECTOMY  2005   biliary dyskinesia  . COLONOSCOPY  JUN 2012 ABD PN/DIARRHEA WITH PROPOFOL   NL COLON  . DILATION AND CURETTAGE OF UTERUS    .  ESOPHAGEAL DILATION N/A 04/10/2015   Procedure: ESOPHAGEAL DILATION WITH 54FR MALONEY DILATOR;  Surgeon: Daneil Dolin, MD;  Location: [facility];  Service: Endoscopy;  Laterality: N/A;  . ESOPHAGOGASTRODUODENOSCOPY    . ESOPHAGOGASTRODUODENOSCOPY (EGD) WITH PROPOFOL N/A 04/10/2015   Procedure: ESOPHAGOGASTRODUODENOSCOPY (EGD) WITH PROPOFOL;  Surgeon: Daneil Dolin, MD;  Location: [facility];  Service: Endoscopy;  Laterality: N/A;  . GAB  2007   in [place]-POUCH 5 CM  . GASTRIC BYPASS  06/2006  . HYSTEROSCOPY W/D&C N/A 09/12/2014   Procedure: DILATATION AND CURETTAGE /HYSTEROSCOPY;  Surgeon: Jonnie Kind, MD;  Location: [facility];  Service:  Gynecology;  Laterality: N/A;  . REPAIR VAGINAL CUFF N/A 07/30/2014   Procedure: REPAIR VAGINAL CUFF;  Surgeon: Mora Bellman, MD;  Location: [facility];  Service: Gynecology;  Laterality: N/A;  . SAVORY DILATION  06/20/2012   Dr. Barnie Alderman gastritis/Ulcer in the mid jejunum. Empiric dilation.   . SURAL NERVE BX Left 02/25/2016   Procedure: LEFT SURAL NERVE BIOPSY;  Surgeon: Jovita Gamma, MD;  Location: [facility];  Service: Neurosurgery;  Laterality: Left;  Left sural nerve biopsy  . TONSILLECTOMY    . TONSILLECTOMY AND ADENOIDECTOMY    . UPPER GASTROINTESTINAL ENDOSCOPY  JULY 2011 NAUSEA-D125,V6, PH 25   Bx; GASTRITIS, POUCH-5 CM LONG  . WISDOM TOOTH EXTRACTION       reports that she has been smoking Cigarettes.  She has a 0.60 pack-year smoking history. She has never used smokeless tobacco. She reports that she drinks alcohol. She reports that she does not use drugs.  Allergies  Allergen Reactions  . Gabapentin Other (See Comments)    Pt states that she was unresponsive after taking this medication, but her vitals remained stable.    . Metoclopramide Hcl Anxiety and Other (See Comments)    Pt states that she felt like she was trapped in a box, and could not get out.  Pt also states that she had temporary loss of movement, weakness, and tingling.    . Tramadol Other (See Comments)    Seizures  . Morphine And Related Other (See Comments)    Chest pain   . Nucynta [Tapentadol]     vomiting  . Propofol Nausea And Vomiting  . Trazodone And Nefazodone Other (See Comments)    Makes pt "like a zombie"  . Zofran [Ondansetron] Other (See Comments)    Migraines   . Butrans [Buprenorphine]     Patch caused rash, didn't help with pain  . Latex Rash  . Lyrica [Pregabalin]     suicidal  . Tape Itching and Rash    Please use paper tape    Family History  Problem Relation Age of Onset  . Hemochromatosis Maternal Grandmother   . Migraines Maternal Grandmother   . Cancer Maternal  Grandmother   . Breast cancer Maternal Grandmother   . Hypertension Father   . Diabetes Father   . Coronary artery disease Father   . Migraines Paternal Grandmother   . Breast cancer Paternal Grandmother   . Cancer Mother     breast  . Hemochromatosis Mother   . Breast cancer Mother   . Depression Mother   . Anxiety disorder Mother   . Coronary artery disease Paternal Grandfather   . Anxiety disorder Brother   . Bipolar disorder Brother   . Healthy Son   . Healthy Daughter     Prior to Admission medications   Medication Sig Start Date End Date Taking? Authorizing Provider  albuterol (PROVENTIL HFA;VENTOLIN HFA) 108 (90 Base) MCG/ACT inhaler Inhale 2 puffs into the lungs every 6 (six) hours as needed for wheezing. 05/28/16  Yes Kathyrn Drown, MD  amphetamine-dextroamphetamine (ADDERALL XR) 30 MG 24 hr capsule Take 1 capsule (30 mg total) by mouth daily. 10/27/16  Yes Cloria Spring, MD  APAP-Pamabrom-Pyrilamine (MENSTRUAL PAIN RELIEF) 500-25-15 MG TABS Take 1-2 tablets by mouth as needed (for pain). Reported on 06/09/2016   Yes Historical Provider, MD  carbamazepine (TEGRETOL) 200 MG tablet Take two tablets at bedtime Patient taking differently: Take 400 mg by mouth at bedtime.  10/27/16  Yes Cloria Spring, MD  clonazePAM (KLONOPIN) 0.5 MG tablet Take 1 tablet (0.5 mg total) by mouth 2 (two) times daily. 10/27/16 10/27/17 Yes Cloria Spring, MD  DULoxetine (CYMBALTA) 60 MG capsule Take 1 capsule (60 mg total) by mouth 2 (two) times daily. 10/27/16  Yes Cloria Spring, MD  HYDROcodone-acetaminophen [facility]) 10-325 MG tablet Take 1 tablet by mouth every 6 (six) hours as needed. 09/28/16  Yes Cloria Spring, MD  pantoprazole (PROTONIX) 40 MG tablet Take 1 tablet (40 mg total) by mouth daily. 06/10/16  Yes Kathyrn Drown, MD  tiZANidine (ZANAFLEX) 2 MG tablet Take 1 tablet by mouth TID  prn Patient taking differently: Take 2 mg by mouth every 8 (eight) hours as needed for muscle spasms.   09/02/16  Yes Kathyrn Drown, MD  topiramate (TOPAMAX) 50 MG tablet TAKE 1 TABLET BY MOUTH EACH MORNING AND 2 TABLETS IN THE EVENING AS NEEDED FOR MIGRAINES. Patient taking differently: Take 50-100 mg by mouth 2 (two) times daily. TAKE 1 TABLET BY MOUTH EACH MORNING AND 2 TABLETS IN THE EVENING AS NEEDED FOR MIGRAINES. 03/09/16  Yes Kathyrn Drown, MD  amphetamine-dextroamphetamine (ADDERALL XR) 30 MG 24 hr capsule Take 1 capsule (30 mg total) by mouth daily. Patient not taking: Reported on 12/03/2016 10/27/16   Cloria Spring, MD    Physical Exam: Vitals:   12/03/16 2315 12/03/16 2330 12/04/16 0000 12/04/16 0056  BP:  115/62 106/65 121/67  Pulse: 72   61  Resp: '13 13 11 18  '$ Temp:    98 F (36.7 C)  TempSrc:    Oral  SpO2: 97%   100%  Weight:    101.2 kg (223 lb 3.2 oz)  Height:    '5\' 6"'$  (1.676 m)      Constitutional: Moderately built and nourished. Vitals:   12/03/16 2315 12/03/16 2330 12/04/16 0000 12/04/16 0056  BP:  115/62 106/65 121/67  Pulse: 72   61  Resp: '13 13 11 18  '$ Temp:    98 F (36.7 C)  TempSrc:    Oral  SpO2: 97%   100%  Weight:    101.2 kg (223 lb 3.2 oz)  Height:    '5\' 6"'$  (1.676 m)   Eyes: Anicteric no pallor. ENMT: No discharge from the ears eyes nose or mouth. Neck: No mass felt. No neck rigidity. Respiratory: No rhonchi or crepitations. Cardiovascular: S1-S2 heard. Abdomen: Mild tenderness in the both lower quadrant. Musculoskeletal: No edema. No joint effusion. Skin: No rash. Skin appears normal. Neurologic: Alert awake oriented to time place and person. Moves all extremities. Psychiatric: Appears normal. Normal affect.   Labs on Admission: I have personally reviewed following labs and imaging studies  CBC:  Recent Labs Lab 12/03/16 1601 12/03/16 1746  WBC  --  6.9  NEUTROABS  --  4.1  HGB 7.3* 7.7*  HCT  --  25.9*  MCV  --  77.3*  PLT  --  259   Basic Metabolic Panel:  Recent Labs Lab 12/03/16 1746  NA 136  K 3.3*  CL 109  CO2  21*  GLUCOSE 92  BUN 14  CREATININE 0.55  CALCIUM 8.5*   GFR: Estimated Creatinine Clearance: 124.7 mL/min (by C-G formula based on SCr of 0.55 mg/dL). Liver Function Tests:  Recent Labs Lab 12/03/16 1746  AST 20  ALT 23  ALKPHOS 70  BILITOT 0.4  PROT 6.9  ALBUMIN 3.7    Recent Labs Lab 12/03/16 1746  LIPASE 31   No results for input(s): AMMONIA in the last 168 hours. Coagulation Profile: No results for input(s): INR, PROTIME in the last 168 hours. Cardiac Enzymes: No results for input(s): CKTOTAL, CKMB, CKMBINDEX, TROPONINI in the last 168 hours. BNP (last 3 results) No results for input(s): PROBNP in the last 8760 hours. HbA1C: No results for input(s): HGBA1C in the last 72 hours. CBG: No results for input(s): GLUCAP in the last 168 hours. Lipid Profile: No results for input(s): CHOL, HDL, LDLCALC, TRIG, CHOLHDL, LDLDIRECT in the last 72 hours. Thyroid Function Tests: No results for input(s): TSH, T4TOTAL, FREET4, T3FREE, THYROIDAB in the last 72 hours. Anemia Panel:  Recent Labs  12/03/16 1746 12/03/16 1747  VITAMINB12 284  --   FOLATE  --  5.6*  FERRITIN 2*  --   TIBC 447  --   IRON 11*  --   RETICCTPCT 1.4  --    Urine analysis:    Component Value Date/Time   COLORURINE STRAW (A) 12/03/2016 1908   APPEARANCEUR CLEAR 12/03/2016 1908   LABSPEC 1.008 12/03/2016 1908   PHURINE 5.0 12/03/2016 1908   GLUCOSEU NEGATIVE 12/03/2016 1908   HGBUR NEGATIVE 12/03/2016 1908   BILIRUBINUR NEGATIVE 12/03/2016 1908   KETONESUR NEGATIVE 12/03/2016 1908   PROTEINUR NEGATIVE 12/03/2016 1908   UROBILINOGEN 4.0 (H) 08/17/2015 1300   NITRITE NEGATIVE 12/03/2016 1908   LEUKOCYTESUR NEGATIVE 12/03/2016 1908   Sepsis Labs: '@LABRCNTIP'$ (procalcitonin:4,lacticidven:4) )No results found for this or any previous visit (from the past 240 hour(s)).   Radiological Exams on Admission: Ct Abdomen Pelvis W Contrast  Result Date: 12/03/2016 CLINICAL DATA:  Oral contrast  per protocol; RLQ pain; Pt sent from Dr Lance Sell office, pale ,weak, Hemoglobin 7.3, vomiting x 1 weeks, diarrhea, vaginal bleeding x 2 weeks^130m ISOVUE-300 IOPAMIDOL (ISOVUE-300) INJECTION 61% EXAM: CT ABDOMEN AND PELVIS WITH CONTRAST TECHNIQUE: Multidetector CT imaging of the abdomen and pelvis was performed using the standard protocol following bolus administration of intravenous contrast. CONTRAST:  1040mISOVUE-300 IOPAMIDOL (ISOVUE-300) INJECTION 61% COMPARISON:  06/15/2016 FINDINGS: Lower chest: No pulmonary nodules, pleural effusions, or infiltrates. Heart size is normal. No imaged pericardial effusion or significant coronary artery calcifications. Hepatobiliary: Status post cholecystectomy. Mild intrahepatic biliary duct dilatation. Common bile duct is prominent, measuring 1.3 cm. Dilatation is increased since prior study on 04/07/2015. Pancreas: Unremarkable. No pancreatic ductal dilatation or surrounding inflammatory changes. Spleen: Normal in size without focal abnormality. Adrenals/Urinary Tract: Adrenal glands are unremarkable. Kidneys are normal, without renal calculi, focal lesion, or hydronephrosis. Bladder is unremarkable. Stomach/Bowel: Status post gastric bypass surgery, unremarkable in appearance. Small bowel loops are unremarkable in appearance. The appendix is well seen and has a normal appearance. Early contrast filling of the large bowel limits evaluation of the colon. However, no focal abnormality identified. Vascular/Lymphatic: No significant vascular findings are present. Numerous small, nonspecific mesenteric lymph nodes are present. Reproductive: The  uterus is present.  No adnexal mass. Other: No abdominal wall hernia or abnormality. No abdominopelvic ascites. Musculoskeletal: No acute or significant osseous findings. IMPRESSION: 1. Status post cholecystectomy. 2. Mild intra and extrahepatic biliary duct dilatation may be explained by the patient's postoperative state but is more  prominent than typical and has increased. Correlation with hepatic enzymes recommended. Consider MRCP for further evaluation. 3. Uncomplicated appearance of gastric bypass surgery. 4. Small nonspecific mesenteric lymph nodes. Electronically Signed   By: Nolon Nations M.D.   On: 12/03/2016 21:57     Assessment/Plan Principal Problem:   Symptomatic anemia Active Problems:   ADD (attention deficit disorder) without hyperactivity   Abdominal pain, acute, right lower quadrant   Pseudoseizures   Chronic migraine without aura   Folate deficiency   Nausea vomiting and diarrhea   Severe anemia    1. Symptomatic anemia - patient is receiving 2 units of packed red blood cell transfusion. Follow CBC. Anemia probably from menorrhagia and nutritional deficiencies. See #3. 2. Nausea vomiting diarrhea with abdominal pain with abnormal CT - CT scan shows intrahepatic and extrahepatic ductal dilation. Radiologist is recommending MRCP. Patient's LFTs are normal. I have ordered MRCP. Trend LFTs. Check stool studies for GI pathogen. May consider GI consult in a.m. 3. Iron deficiency, folate deficiency and B-12 around borderline normal - at this time I have ordered homocysteine level and Methylmalonic acid levels to confirm B-12 deficiency. For now I am giving 1 dose of B-12 1000 micrograms subcutaneous and folate. Patient will need iron supplements. 4. History of migraine on Topamax. 5. History of pseudoseizures on Tegretol. 6. History of depression and anxiety. 7. Chronic pain.   DVT prophylaxis: SCDs. Code Status: Full code.  Family Communication: Discussed with patient.  Disposition Plan: Home.  Consults called: None.  Admission status: Observation.    Rise Patience MD [facility] Pager [phone].  If 7PM-7AM, please contact night-coverage [email or web address] Password [email or web address]  12/04/2016, 2:06 AM

## 2016-12-04 NOTE — Progress Notes (Signed)
Patient admitted to the hospital earlier this morning by Dr. Delford Field  Patient seen and examined. She continues to have right upper and lower quadrant abdominal pain. She has associated nausea and difficulty swallowing. She feels that whatever she eats gets caught in her chest causing her to throw up. She continues to have loose stool. C. difficile and GI pathogen panel have been ordered. Last stool was reported as dark in color and positive for occult blood. She reports that is not normally this started. She does admit to using nonsteroidal anti-inflammatories. She also takes PPI regularly. Patient is status post gastric bypass approximately 10 years ago. She was admitted with iron deficiency anemia and has been transfused 2 units of PRBCs. Her anemia is likely related to heavy flow during menstrual cycle as well as possible GI blood loss. B-12 was also noted to be mildly low. She likely also has nutritional deficiencies. Loose stool may also be related to malabsorption due to her gastric bypass although this is apparently a relatively new finding. She does not report any recent changes in her diet. CT imaging showed dilated hepatic ducts which was further evaluated by MRI and appears to be consistent with history of cholecystectomy. She may need further workup including endoscopy with esophageal dilatation. Will request gastrology input. Follow up stool studies. Continue to follow serial hemoglobins.  Penny [name]

## 2016-12-04 NOTE — ED Notes (Signed)
Report given to [facility], all questions answered

## 2016-12-05 DIAGNOSIS — R112 Nausea with vomiting, unspecified: Secondary | ICD-10-CM

## 2016-12-05 DIAGNOSIS — G43719 Chronic migraine without aura, intractable, without status migrainosus: Secondary | ICD-10-CM

## 2016-12-05 DIAGNOSIS — R197 Diarrhea, unspecified: Secondary | ICD-10-CM

## 2016-12-05 DIAGNOSIS — D649 Anemia, unspecified: Secondary | ICD-10-CM

## 2016-12-05 DIAGNOSIS — G894 Chronic pain syndrome: Secondary | ICD-10-CM

## 2016-12-05 DIAGNOSIS — R1031 Right lower quadrant pain: Secondary | ICD-10-CM

## 2016-12-05 DIAGNOSIS — R945 Abnormal results of liver function studies: Secondary | ICD-10-CM

## 2016-12-05 LAB — COMPREHENSIVE METABOLIC PANEL
ALK PHOS: 205 U/L — AB (ref 38–126)
ALT: 164 U/L — ABNORMAL HIGH (ref 14–54)
ANION GAP: 5 (ref 5–15)
AST: 374 U/L — ABNORMAL HIGH (ref 15–41)
Albumin: 3.3 g/dL — ABNORMAL LOW (ref 3.5–5.0)
BILIRUBIN TOTAL: 0.5 mg/dL (ref 0.3–1.2)
BUN: <5 mg/dL — ABNORMAL LOW (ref 6–20)
CO2: 23 mmol/L (ref 22–32)
Calcium: 8.2 mg/dL — ABNORMAL LOW (ref 8.9–10.3)
Chloride: 111 mmol/L (ref 101–111)
Creatinine, Ser: 0.49 mg/dL (ref 0.44–1.00)
Glucose, Bld: 100 mg/dL — ABNORMAL HIGH (ref 65–99)
Potassium: 3.4 mmol/L — ABNORMAL LOW (ref 3.5–5.1)
SODIUM: 139 mmol/L (ref 135–145)
TOTAL PROTEIN: 6.1 g/dL — AB (ref 6.5–8.1)

## 2016-12-05 LAB — GASTROINTESTINAL PANEL BY PCR, STOOL (REPLACES STOOL CULTURE)
ASTROVIRUS: NOT DETECTED
Adenovirus F40/41: NOT DETECTED
Campylobacter species: NOT DETECTED
Cryptosporidium: NOT DETECTED
Cyclospora cayetanensis: NOT DETECTED
ENTAMOEBA HISTOLYTICA: NOT DETECTED
ENTEROAGGREGATIVE E COLI (EAEC): NOT DETECTED
ENTEROPATHOGENIC E COLI (EPEC): NOT DETECTED
ENTEROTOXIGENIC E COLI (ETEC): NOT DETECTED
GIARDIA LAMBLIA: NOT DETECTED
NOROVIRUS GI/GII: NOT DETECTED
Plesimonas shigelloides: NOT DETECTED
ROTAVIRUS A: NOT DETECTED
SALMONELLA SPECIES: NOT DETECTED
SHIGA LIKE TOXIN PRODUCING E COLI (STEC): NOT DETECTED
SHIGELLA/ENTEROINVASIVE E COLI (EIEC): NOT DETECTED
Sapovirus (I, II, IV, and V): NOT DETECTED
VIBRIO CHOLERAE: NOT DETECTED
Vibrio species: NOT DETECTED
Yersinia enterocolitica: NOT DETECTED

## 2016-12-05 LAB — CBC
HEMATOCRIT: 33.2 % — AB (ref 36.0–46.0)
Hemoglobin: 10.2 g/dL — ABNORMAL LOW (ref 12.0–15.0)
MCH: 24.9 pg — AB (ref 26.0–34.0)
MCHC: 30.7 g/dL (ref 30.0–36.0)
MCV: 81 fL (ref 78.0–100.0)
Platelets: 264 10*3/uL (ref 150–400)
RBC: 4.1 MIL/uL (ref 3.87–5.11)
RDW: 16.5 % — ABNORMAL HIGH (ref 11.5–15.5)
WBC: 4.7 10*3/uL (ref 4.0–10.5)

## 2016-12-05 LAB — TYPE AND SCREEN
BLOOD PRODUCT EXPIRATION DATE: 201801102359
Blood Product Expiration Date: 201801102359
ISSUE DATE / TIME: 201712300246
ISSUE DATE / TIME: 201712300611
UNIT TYPE AND RH: 6200
UNIT TYPE AND RH: 6200

## 2016-12-05 LAB — OCCULT BLOOD X 1 CARD TO LAB, STOOL: Fecal Occult Bld: NEGATIVE

## 2016-12-05 MED ORDER — OXYCODONE HCL 5 MG PO TABS
5.0000 mg | ORAL_TABLET | ORAL | Status: DC | PRN
  Administered 2016-12-05 – 2016-12-06 (×5): 10 mg via ORAL
  Filled 2016-12-05 (×5): qty 2

## 2016-12-05 MED ORDER — SODIUM CHLORIDE 0.9 % IV SOLN
INTRAVENOUS | Status: AC
  Administered 2016-12-05 – 2016-12-07 (×4): via INTRAVENOUS

## 2016-12-05 MED ORDER — HYDROMORPHONE HCL 1 MG/ML IJ SOLN
0.5000 mg | Freq: Once | INTRAMUSCULAR | Status: AC
  Administered 2016-12-05: 0.5 mg via INTRAVENOUS

## 2016-12-05 MED ORDER — HYDROMORPHONE HCL 1 MG/ML IJ SOLN
0.5000 mg | Freq: Once | INTRAMUSCULAR | Status: AC
  Administered 2016-12-05: 0.5 mg via INTRAVENOUS
  Filled 2016-12-05: qty 1

## 2016-12-05 MED ORDER — SIMETHICONE 80 MG PO CHEW
160.0000 mg | CHEWABLE_TABLET | Freq: Four times a day (QID) | ORAL | Status: DC
  Administered 2016-12-05 – 2016-12-08 (×14): 160 mg via ORAL
  Filled 2016-12-05 (×15): qty 2

## 2016-12-05 NOTE — Progress Notes (Signed)
Pt complains of severe abdominal pain when having bowel movements.  Pt currently double over in pain, states pain is severe, 10/10 abdominal pain, stabbing in character.  Dr. Roderic Palau paged and made aware.  New order for one time dose of IV Dilaudid.  Will continue to monitor.

## 2016-12-05 NOTE — Progress Notes (Signed)
PROGRESS NOTE    Penny [name]  NWG:[id number] DOB: 1986-05-30 DOA: 12/03/2016 PCP: [name], MD    Brief Narrative:  30 year old female with history of gastric bypass, migraines, pseudoseizures, we'll refer to the emergency room due to low hemoglobin found at her primary care office. She's been feeling increasingly weak for 2-3 weeks prior to admission and has had persistent nausea, vomiting and diarrhea. She also had right-sided abdominal pain. Hemoglobin was found to be 7.3. Abdominal imaging indicated dilated extrahepatic ducts. She was referred for admission.   Assessment & Plan:   Principal Problem:   Symptomatic anemia Active Problems:   ADD (attention deficit disorder) without hyperactivity   Abdominal pain, acute, right lower quadrant   Pseudoseizures   Chronic migraine without aura   Folate deficiency   Nausea vomiting and diarrhea   Severe anemia   1. Symptomatic anemia. Patient received 2 units of PRBCs on admission with improvement of hemoglobin. Anemia is related to iron deficiency which is likely a combination of chronic blood loss due to menstrual cycles as well as diminished absorption due to her GI surgeries. She's not been taking any supplemental iron. She's also been taking NSAIDs and was found to have 1 out of 2 stools positive for occult blood. Continue on PPI.  2. Diarrhea. Patient has associated right-sided abdominal pain. Stool studies are currently in process.  3. B-12 deficiency. Patient has not been taking any B-12 supplementation since her GI surgery. She will need lifelong replacement.  4. Dysphagia and history of esophageal dilatations. Seen by gastroenterology and she will likely need to undergo EGD with possible dilatation.  5. Elevated liver enzymes. Patient is status post cholecystectomy. CT and MRI imaging of her abdomen indicated dilated hepatic ducts, no evidence of choledocholithiasis. May have sphincter of OD dysfunction. GI following.  Will check hepatitis panel and repeat LFTs in the morning.  6. Chronic pain syndrome. She follows with a pain clinic in [place].  7. History of migraines. Continue on Topamax  8. History of pseudoseizures. On Tegretol.   DVT prophylaxis: scds Code Status: Full code Family Communication: No family present Disposition Plan: Discharge home once improved   Consultants:   Gastroenterology  Procedures:     Antimicrobials:      Subjective: Continues to have significant abdominal pain and diarrhea.  Objective: Vitals:   12/04/16 1430 12/04/16 2052 12/05/16 0546 12/05/16 1353  BP: 116/67 117/70 115/69 133/77  Pulse: 70 (!) 55 64 74  Resp: 18 20 20 18   Temp: 98.1 F (36.7 C) 98.2 F (36.8 C) 98.3 F (36.8 C) 98.3 F (36.8 C)  TempSrc: Oral Oral Oral Oral  SpO2: 99% 100% 99% 98%  Weight:      Height:        Intake/Output Summary (Last 24 hours) at 12/05/16 1818 Last data filed at 12/05/16 1610  Gross per 24 hour  Intake          1933.33 ml  Output             1300 ml  Net           633.33 ml   Filed Weights   12/03/16 1728 12/04/16 0056  Weight: 99.8 kg (220 lb) 101.2 kg (223 lb 3.2 oz)    Examination:  General exam: Appears calm and comfortable  Respiratory system: Clear to auscultation. Respiratory effort normal. Cardiovascular system: S1 & S2 heard, RRR. No JVD, murmurs, rubs, gallops or clicks. No pedal edema. Gastrointestinal system: Abdomen is nondistended,  soft and significantly tender in the right upper and lower quadrants. No organomegaly or masses felt. Normal bowel sounds heard. Central nervous system: Alert and oriented. No focal neurological deficits. Extremities: Symmetric 5 x 5 power. Skin: No rashes, lesions or ulcers Psychiatry: Judgement and insight appear normal. Mood & affect appropriate.     Data Reviewed: I have personally reviewed following labs and imaging studies  CBC:  Recent Labs Lab 12/03/16 1601 12/03/16 1746  12/04/16 1124 12/05/16 0658  WBC  --  6.9 4.7 4.7  NEUTROABS  --  4.1  --   --   HGB 7.3* 7.7* 9.3* 10.2*  HCT  --  25.9* 29.6* 33.2*  MCV  --  77.3* 80.0 81.0  PLT  --  317 262 264   Basic Metabolic Panel:  Recent Labs Lab 12/03/16 1746 12/04/16 1124 12/05/16 0658  NA 136 138 139  K 3.3* 3.4* 3.4*  CL 109 109 111  CO2 21* 24 23  GLUCOSE 92 98 100*  BUN 14 8 <5*  CREATININE 0.55 0.53 0.49  CALCIUM 8.5* 8.2* 8.2*   GFR: Estimated Creatinine Clearance: 124.7 mL/min (by C-G formula based on SCr of 0.49 mg/dL). Liver Function Tests:  Recent Labs Lab 12/03/16 1746 12/05/16 0658  AST 20 374*  ALT 23 164*  ALKPHOS 70 205*  BILITOT 0.4 0.5  PROT 6.9 6.1*  ALBUMIN 3.7 3.3*    Recent Labs Lab 12/03/16 1746  LIPASE 31   No results for input(s): AMMONIA in the last 168 hours. Coagulation Profile: No results for input(s): INR, PROTIME in the last 168 hours. Cardiac Enzymes: No results for input(s): CKTOTAL, CKMB, CKMBINDEX, TROPONINI in the last 168 hours. BNP (last 3 results) No results for input(s): PROBNP in the last 8760 hours. HbA1C: No results for input(s): HGBA1C in the last 72 hours. CBG: No results for input(s): GLUCAP in the last 168 hours. Lipid Profile: No results for input(s): CHOL, HDL, LDLCALC, TRIG, CHOLHDL, LDLDIRECT in the last 72 hours. Thyroid Function Tests: No results for input(s): TSH, T4TOTAL, FREET4, T3FREE, THYROIDAB in the last 72 hours. Anemia Panel:  Recent Labs  12/03/16 1746 12/03/16 1747  VITAMINB12 284  --   FOLATE  --  5.6*  FERRITIN 2*  --   TIBC 447  --   IRON 11*  --   RETICCTPCT 1.4  --    Sepsis Labs:  Recent Labs Lab 12/03/16 1747  LATICACIDVEN 0.9    Recent Results (from the past 240 hour(s))  Gastrointestinal Panel by PCR , Stool     Status: None   Collection Time: 12/04/16  2:01 AM  Result Value Ref Range Status   Campylobacter species NOT DETECTED NOT DETECTED Final   Plesimonas shigelloides NOT  DETECTED NOT DETECTED Final   Salmonella species NOT DETECTED NOT DETECTED Final   Yersinia enterocolitica NOT DETECTED NOT DETECTED Final   Vibrio species NOT DETECTED NOT DETECTED Final   Vibrio cholerae NOT DETECTED NOT DETECTED Final   Enteroaggregative E coli (EAEC) NOT DETECTED NOT DETECTED Final   Enteropathogenic E coli (EPEC) NOT DETECTED NOT DETECTED Final   Enterotoxigenic E coli (ETEC) NOT DETECTED NOT DETECTED Final   Shiga like toxin producing E coli (STEC) NOT DETECTED NOT DETECTED Final   Shigella/Enteroinvasive E coli (EIEC) NOT DETECTED NOT DETECTED Final   Cryptosporidium NOT DETECTED NOT DETECTED Final   Cyclospora cayetanensis NOT DETECTED NOT DETECTED Final   Entamoeba histolytica NOT DETECTED NOT DETECTED Final   Giardia lamblia NOT  DETECTED NOT DETECTED Final   Adenovirus F40/41 NOT DETECTED NOT DETECTED Final   Astrovirus NOT DETECTED NOT DETECTED Final   Norovirus GI/GII NOT DETECTED NOT DETECTED Final   Rotavirus A NOT DETECTED NOT DETECTED Final   Sapovirus (I, II, IV, and V) NOT DETECTED NOT DETECTED Final         Radiology Studies: Ct Abdomen Pelvis W Contrast  Result Date: 12/03/2016 CLINICAL DATA:  Oral contrast per protocol; RLQ pain; Pt sent from Dr Fletcher Anon office, pale ,weak, Hemoglobin 7.3, vomiting x 1 weeks, diarrhea, vaginal bleeding x 2 weeks^14mL ISOVUE-300 IOPAMIDOL (ISOVUE-300) INJECTION 61% EXAM: CT ABDOMEN AND PELVIS WITH CONTRAST TECHNIQUE: Multidetector CT imaging of the abdomen and pelvis was performed using the standard protocol following bolus administration of intravenous contrast. CONTRAST:  ISOVUE-300 IOPAMIDOL (ISOVUE-300) INJECTION 61% COMPARISON:  06/15/2016 FINDINGS: Lower chest: No pulmonary nodules, pleural effusions, or infiltrates. Heart size is normal. No imaged pericardial effusion or significant coronary artery calcifications. Hepatobiliary: Status post cholecystectomy. Mild intrahepatic biliary duct dilatation.  Common bile duct is prominent, measuring 1.3 cm. Dilatation is increased since prior study on 04/07/2015. Pancreas: Unremarkable. No pancreatic ductal dilatation or surrounding inflammatory changes. Spleen: Normal in size without focal abnormality. Adrenals/Urinary Tract: Adrenal glands are unremarkable. Kidneys are normal, without renal calculi, focal lesion, or hydronephrosis. Bladder is unremarkable. Stomach/Bowel: Status post gastric bypass surgery, unremarkable in appearance. Small bowel loops are unremarkable in appearance. The appendix is well seen and has a normal appearance. Early contrast filling of the large bowel limits evaluation of the colon. However, no focal abnormality identified. Vascular/Lymphatic: No significant vascular findings are present. Numerous small, nonspecific mesenteric lymph nodes are present. Reproductive: The uterus is present.  No adnexal mass. Other: No abdominal wall hernia or abnormality. No abdominopelvic ascites. Musculoskeletal: No acute or significant osseous findings. IMPRESSION: 1. Status post cholecystectomy. 2. Mild intra and extrahepatic biliary duct dilatation may be explained by the patient's postoperative state but is more prominent than typical and has increased. Correlation with hepatic enzymes recommended. Consider MRCP for further evaluation. 3. Uncomplicated appearance of gastric bypass surgery. 4. Small nonspecific mesenteric lymph nodes. Electronically Signed   By: Norva Pavlov M.D.   On: 12/03/2016 21:57   Mr Abdomen Mrcp Wo Contrast  Result Date: 12/04/2016 CLINICAL DATA:  Dilated common bile duct. Patient status post cholecystectomy. EXAM: MRI ABDOMEN WITHOUT CONTRAST  (INCLUDING MRCP) TECHNIQUE: Multiplanar multisequence MR imaging of the abdomen was performed. Heavily T2-weighted images of the biliary and pancreatic ducts were obtained, and three-dimensional MRCP images were rendered by post processing. COMPARISON:  CT 12/03/2016 FINDINGS: Lower  chest:  Lung bases are clear. Hepatobiliary: Mild intrahepatic duct dilatation. The common bile duct measures 7 mm in diameter. There is a low insertion of the cystic duct. No filling defect within the common bile duct. No external compression. There is no focal hepatic lesion. No hepatic steatosis. Patient status post cholecystectomy. Pancreas: Splenic parenchyma appears normal. There is no variant ductal anatomy identified. No duct dilatation or inflammation. Spleen: Normal spleen. Adrenals/urinary tract: Adrenal glands and kidneys are normal. Stomach/Bowel: Stomach and limited of the small bowel is unremarkable Vascular/Lymphatic: Abdominal aortic normal caliber. No retroperitoneal periportal lymphadenopathy. Musculoskeletal: No aggressive osseous lesion IMPRESSION: 1. Mild intrahepatic and extrahepatic duct dilatation. Favor sequelae of cholecystectomy. 2. Low insertion of the cystic duct remnant. 3. Normal pancreas without evidence ductal variation or inflammation. 4. Normal hepatic parenchymal. Electronically Signed   By: Genevive Bi M.D.   On: 12/04/2016 11:16  Mr 3d Recon At Scanner  Result Date: 12/04/2016 CLINICAL DATA:  Dilated common bile duct. Patient status post cholecystectomy. EXAM: MRI ABDOMEN WITHOUT CONTRAST  (INCLUDING MRCP) TECHNIQUE: Multiplanar multisequence MR imaging of the abdomen was performed. Heavily T2-weighted images of the biliary and pancreatic ducts were obtained, and three-dimensional MRCP images were rendered by post processing. COMPARISON:  CT 12/03/2016 FINDINGS: Lower chest:  Lung bases are clear. Hepatobiliary: Mild intrahepatic duct dilatation. The common bile duct measures 7 mm in diameter. There is a low insertion of the cystic duct. No filling defect within the common bile duct. No external compression. There is no focal hepatic lesion. No hepatic steatosis. Patient status post cholecystectomy. Pancreas: Splenic parenchyma appears normal. There is no variant  ductal anatomy identified. No duct dilatation or inflammation. Spleen: Normal spleen. Adrenals/urinary tract: Adrenal glands and kidneys are normal. Stomach/Bowel: Stomach and limited of the small bowel is unremarkable Vascular/Lymphatic: Abdominal aortic normal caliber. No retroperitoneal periportal lymphadenopathy. Musculoskeletal: No aggressive osseous lesion IMPRESSION: 1. Mild intrahepatic and extrahepatic duct dilatation. Favor sequelae of cholecystectomy. 2. Low insertion of the cystic duct remnant. 3. Normal pancreas without evidence ductal variation or inflammation. 4. Normal hepatic parenchymal. Electronically Signed   By: Genevive Bi M.D.   On: 12/04/2016 11:16        Scheduled Meds: . amphetamine-dextroamphetamine  30 mg Oral Daily  . carbamazepine  400 mg Oral QHS  . clonazePAM  0.5 mg Oral BID  . DULoxetine  60 mg Oral BID  . folic acid  1 mg Oral Daily  . pantoprazole  40 mg Oral Daily  . simethicone  160 mg Oral QID   Continuous Infusions: . sodium chloride 100 mL/hr at 12/05/16 1602     LOS: 1 day    Time spent:    Lenzie Montesano, MD [facility] Pager [phone]  If 7PM-7AM, please contact night-coverage [email or web address] Password [email or web address] 12/05/2016, 6:18 PM

## 2016-12-05 NOTE — Consult Note (Signed)
Referring Provider: Kathie Dike, MD Primary Care Physician:  [name], MD Primary Gastroenterologist:  Dr. Laural Golden  Reason for Consultation:    Nausea vomiting abdominal pain GI bleed anemia and elevated transaminases. Patient also complains of dysphagia.  HPI:   Patient is 30 year old Caucasian female with multiple medical problems who underwent gastric bypass surgery for morbid obesity in 2007 was had chronic abdominal pain intermittently elevated transaminases as well as history of iron deficiency anemia and GI bleed who was sent to ER by [name] because of low hemoglobin and abdominal pain. Patient states she has not been feeling well for at least 2 weeks. She's had postprandial nausea and vomiting abdominal pain and diarrhea. She has noted her stools to be dark but not quite black and she has noted scant amount of blood in her vomitus. She complains of pain in epigastric and right upper quadrant region but she states most of the pain is in the right low quadrant. Right low quadrant pain is excruciating when she has a bowel movement. She states she has not lost any weight despite her symptoms. She has not experienced fever or chills. She also complains of solid food dysphagia. She states she's had dysphagia intimately for the last 6 months but it has gotten worse lately. She reports symptomatic improvement with prior dilations. She continues to use Aleve for headache she tells me she takes no more than 2 to 3 times in a month. She generally takes 2 at a time. She had EGD with dilation initially in July 2013 and again in May 2016. On her first EGD she was found have gastritis. Biopsy was negative for H. Pylori. She also had multiple small ulcers involving jejunal mucosa. Biopsy revealed nonspecific ulceration. On a recent EGD of May 2016 she was found to have small ulcer distal to gastrojejunostomy. Biopsy revealed focal erosion. She had her esophagus dilated with 54 French Maloney  dilator. She also has history of elevated transaminases. She has mildly dilated bile duct. She was briefly seen in our office in May 2016. I felt that she may have SOD dysfunction and she was referred to [facility] but no further testing was undertaken. She smokes 1-2 cigarettes per day. She is trying her best to quit cigarette smoking. She is married but separated. She has 2 children ages 38 and 2. She is unable to work and trying to go on disability. She is presently supported by her mother who is at bedside.      Past Medical History:  Diagnosis Date  . ADD (attention deficit disorder)   . Anemia 2011   2o to GASTRIC BYPASS  . Fatty liver    . Anxiety   . Blood transfusion without reported diagnosis   . Chronic daily headache     Peripheral neuropathy    . Depression   . Dysmenorrhea 09/18/2013   Restless leg syndrome    . Obesity. Patient states she lost 125 pounds following gastric bypass surgery 10 years ago but she has gained about 30-35 pounds back. Her weight has been stable this year.  JUL 2011ALK PHOS 111-127 AST  143-267 ALT  213-321T BILI 0.6  ALB  3.7-4.06 Jun 2011 ALK PHOS 118 AST 24 ALT 42 T BILI 0.4 ALB 3.9  . Encounter for drug rehabilitation    at behavioral health for opioid addiction about 4 years ago  . Family history of adverse reaction to anesthesia    'dad had to be kept on  pump for breathing for morphine'  . Fatty liver   . Fibromyalgia   . Gastric bypass status for obesity   . Gastritis and jejunal ulcers (July 2013).  JULY 2011   Small bowel ulcer on EGD (May 2016 )   . 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 History of Holter monitoring   . Hx of opioid abuse    for about 4 years, about 4 years ago  . Interstitial cystitis   . Iron deficiency anemia 07/23/2010  . Irritable bowel syndrome 2012 DIARRHEA    Past Surgical History:  Procedure Laterality Date  . BIOPSY  04/10/2015   Procedure: BIOPSY;  Surgeon: Daneil Dolin, MD;   Location: [facility];  Service: Endoscopy;;  . cath self every nite     for sodium bicarb injection (discontinued 2013)  . CHOLECYSTECTOMY  2005   biliary dyskinesia  . COLONOSCOPY  JUN 2012 ABD PN/DIARRHEA WITH PROPOFOL   NL COLON  . DILATION AND CURETTAGE OF UTERUS    . ESOPHAGEAL DILATION N/A 04/10/2015   Procedure: ESOPHAGEAL DILATION WITH 54FR MALONEY DILATOR;  Surgeon: Daneil Dolin, MD;  Location: [facility];  Service: Endoscopy;  Laterality: N/A;  . ESOPHAGOGASTRODUODENOSCOPY    . ESOPHAGOGASTRODUODENOSCOPY (EGD) WITH PROPOFOL N/A 04/10/2015   Procedure: ESOPHAGOGASTRODUODENOSCOPY (EGD) WITH PROPOFOL;  Surgeon: Daneil Dolin, MD;  Location: [facility];  Service: Endoscopy;  Laterality: N/A;  . GAB  2007   in [place]-POUCH 5 CM  . GASTRIC BYPASS  06/2006  . HYSTEROSCOPY W/D&C N/A 09/12/2014   Procedure: DILATATION AND CURETTAGE /HYSTEROSCOPY;  Surgeon: Jonnie Kind, MD;  Location: [facility];  Service: Gynecology;  Laterality: N/A;  . REPAIR VAGINAL CUFF N/A 07/30/2014   Procedure: REPAIR VAGINAL CUFF;  Surgeon: Mora Bellman, MD;  Location: [facility];  Service: Gynecology;  Laterality: N/A;  . SAVORY DILATION  06/20/2012   Dr. Barnie Alderman gastritis/Ulcer in the mid jejunum. Empiric dilation.   . SURAL NERVE BX Left 02/25/2016   Procedure: LEFT SURAL NERVE BIOPSY;  Surgeon: Jovita Gamma, MD;  Location: [place] [facility];  Service: Neurosurgery;  Laterality: Left;  Left sural nerve biopsy  . TONSILLECTOMY    . TONSILLECTOMY AND ADENOIDECTOMY    . UPPER GASTROINTESTINAL ENDOSCOPY  JULY 2011 NAUSEA-D125,V6, PH 25   Bx; GASTRITIS, POUCH-5 CM LONG  . WISDOM TOOTH EXTRACTION      Prior to Admission medications   Medication Sig Start Date End Date Taking? Authorizing Provider  albuterol (PROVENTIL HFA;VENTOLIN HFA) 108 (90 Base) MCG/ACT inhaler Inhale 2 puffs into the lungs every 6 (six) hours as needed for wheezing. 05/28/16  Yes Kathyrn Drown, MD  amphetamine-dextroamphetamine (ADDERALL XR) 30 MG 24  hr capsule Take 1 capsule (30 mg total) by mouth daily. 10/27/16  Yes Cloria Spring, MD  APAP-Pamabrom-Pyrilamine (MENSTRUAL PAIN RELIEF) 500-25-15 MG TABS Take 1-2 tablets by mouth as needed (for pain). Reported on 06/09/2016   Yes Historical Provider, MD  carbamazepine (TEGRETOL) 200 MG tablet Take two tablets at bedtime Patient taking differently: Take 400 mg by mouth at bedtime.  10/27/16  Yes Cloria Spring, MD  clonazePAM (KLONOPIN) 0.5 MG tablet Take 1 tablet (0.5 mg total) by mouth 2 (two) times daily. 10/27/16 10/27/17 Yes Cloria Spring, MD  DULoxetine (CYMBALTA) 60 MG capsule Take 1 capsule (60 mg total) by mouth 2 (two) times daily. 10/27/16  Yes Cloria Spring, MD  HYDROcodone-acetaminophen [facility]) 10-325 MG tablet Take 1 tablet by mouth every 6 (  six) hours as needed. 09/28/16  Yes Cloria Spring, MD  pantoprazole (PROTONIX) 40 MG tablet Take 1 tablet (40 mg total) by mouth daily. 06/10/16  Yes Kathyrn Drown, MD  tiZANidine (ZANAFLEX) 2 MG tablet Take 1 tablet by mouth TID  prn Patient taking differently: Take 2 mg by mouth every 8 (eight) hours as needed for muscle spasms.  09/02/16  Yes Kathyrn Drown, MD  topiramate (TOPAMAX) 50 MG tablet TAKE 1 TABLET BY MOUTH EACH MORNING AND 2 TABLETS IN THE EVENING AS NEEDED FOR MIGRAINES. Patient taking differently: Take 50-100 mg by mouth 2 (two) times daily. TAKE 1 TABLET BY MOUTH EACH MORNING AND 2 TABLETS IN THE EVENING AS NEEDED FOR MIGRAINES. 03/09/16  Yes Kathyrn Drown, MD  amphetamine-dextroamphetamine (ADDERALL XR) 30 MG 24 hr capsule Take 1 capsule (30 mg total) by mouth daily. Patient not taking: Reported on 12/03/2016 10/27/16   Cloria Spring, MD    Current Facility-Administered Medications  Medication Dose Route Frequency Provider Last Rate Last Dose  . 0.9 %  sodium chloride infusion   Intravenous Continuous Kathie Dike, MD      . acetaminophen (TYLENOL) tablet 650 mg  650 mg Oral Q6H PRN Rise Patience, MD       Or  .  acetaminophen (TYLENOL) suppository 650 mg  650 mg Rectal Q6H PRN Rise Patience, MD      . albuterol (PROVENTIL) (2.5 MG/3ML) 0.083% nebulizer solution 3 mL  3 mL Inhalation Q6H PRN Rise Patience, MD      . amphetamine-dextroamphetamine (ADDERALL XR) 24 hr capsule 30 mg  30 mg Oral Daily Rise Patience, MD      . carbamazepine (TEGRETOL) tablet 400 mg  400 mg Oral QHS Rise Patience, MD   400 mg at 12/04/16 2146  . clonazePAM (KLONOPIN) tablet 0.5 mg  0.5 mg Oral BID Rise Patience, MD   0.5 mg at 12/05/16 0855  . DULoxetine (CYMBALTA) DR capsule 60 mg  60 mg Oral BID Rise Patience, MD   60 mg at 12/05/16 0855  . folic acid (FOLVITE) tablet 1 mg  1 mg Oral Daily Rise Patience, MD   1 mg at 12/05/16 0855  . HYDROmorphone (DILAUDID) injection 0.5 mg  0.5 mg Intravenous Q4H PRN Rise Patience, MD   0.5 mg at 12/05/16 1216  . oxyCODONE (Oxy IR/ROXICODONE) immediate release tablet 5-10 mg  5-10 mg Oral Q4H PRN Kathie Dike, MD      . pantoprazole (PROTONIX) EC tablet 40 mg  40 mg Oral Daily Rise Patience, MD   40 mg at 12/05/16 0855  . promethazine (PHENERGAN) injection 12.5 mg  12.5 mg Intravenous Q6H PRN Kathie Dike, MD   12.5 mg at 12/05/16 1216  . simethicone (MYLICON) chewable tablet 160 mg  160 mg Oral QID Kathie Dike, MD   160 mg at 12/05/16 1217  . tiZANidine (ZANAFLEX) tablet 2 mg  2 mg Oral Q8H PRN Rise Patience, MD   2 mg at 12/05/16 1217  . topiramate (TOPAMAX) tablet 100 mg  100 mg Oral Daily PRN Rise Patience, MD   100 mg at 12/05/16 1216  . topiramate (TOPAMAX) tablet 50 mg  50 mg Oral Daily PRN Rise Patience, MD   50 mg at 12/04/16 1555    Allergies as of 12/03/2016 - Review Complete 12/03/2016  Allergen Reaction Noted  . Gabapentin Other (See Comments)   . Metoclopramide hcl  Anxiety and Other (See Comments) 05/27/2011  . Tramadol Other (See Comments) 11/20/2015  . Morphine and related Other (See Comments)  06/11/2015  . Nucynta [tapentadol]  04/21/2016  . Propofol Nausea And Vomiting 02/24/2016  . Trazodone and nefazodone Other (See Comments) 02/23/2016  . Zofran [ondansetron] Other (See Comments) 12/30/2014  . Butrans [buprenorphine]  04/21/2016  . Latex Rash   . Lyrica [pregabalin]  04/21/2016  . Tape Itching and Rash 03/08/2012    Family History  Problem Relation Age of Onset  . Hemochromatosis Maternal Grandmother   . Migraines Maternal Grandmother   . Cancer Maternal Grandmother   . Breast cancer Maternal Grandmother   . Hypertension Father   . Diabetes Father   . Coronary artery disease Father   . Migraines Paternal Grandmother   . Breast cancer Paternal Grandmother   . Cancer Mother     breast  . Hemochromatosis Mother   . Breast cancer Mother   . Depression Mother   . Anxiety disorder Mother   . Coronary artery disease Paternal Grandfather   . Anxiety disorder Brother   . Bipolar disorder Brother   . Healthy Son   . Healthy Daughter     Social History   Social History  . Marital status: Single    Spouse name: N/A  . Number of children: 2  . Years of education: some colle   Occupational History  .  Not Employed   Social History Main Topics  . Smoking status: Current Every Day Smoker    Packs/day: 0.10    Years: 6.00    Types: Cigarettes  . Smokeless tobacco: Never Used     Comment: Patient is down to 1 a day and quiting on her own  . Alcohol use 0.0 oz/week     Comment: once every weeks - maybe a beer with a dinner, rarely  . Drug use: No     Comment: Previously opioid addiction went through rehab  . Sexual activity: Yes    Partners: Male    Birth control/ protection: None   Other Topics Concern  . Not on file   Social History Narrative   Patient is right handed.   Patient drinks one cup of coffee daily.   She lives in a one-story home with her two children (62 year old son, 77 month old daughter).   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 She appears pale. Conjunctiva is also pale and sclerae nonicteric. Oropharyngeal mucosa is normal. No neck masses or thyromegaly noted. Cardiac exam with regular rhythm normal S1 and S2. No murmur or gallop noted. Lungs are clear to auscultation. Abdomen is full. Bowel sounds are normal. On palpation abdomen is soft with mild to moderate tenderness at RLQ as well as epigastrium and right upper quadrant. Tenderness at RLQ appears to be both on superficial and deep palpation. No organomegaly or masses. No peripheral edema or clubbing noted.    Lab Results:  Recent Labs  12/03/16 1746 12/04/16 1124 12/05/16 7322  WBC 6.9 4.7 4.7  HGB 7.7* 9.3* 10.2*  HCT 25.9* 29.6* 33.2*  PLT 317 262 264   BMET  Recent Labs  12/03/16 1746 12/04/16 1124 12/05/16 0658  NA 136 138 139  K 3.3* 3.4* 3.4*  CL 109 109 111  CO2 21* 24 23  GLUCOSE 92 98 100*  BUN 14 8 <5*  CREATININE 0.55 0.53 0.49  CALCIUM 8.5* 8.2* 8.2*   LFT  Recent Labs  12/05/16 0658  PROT 6.1*  ALBUMIN 3.3*  AST 374*  ALT 164*  ALKPHOS 205*  BILITOT 0.5   Serum lipase 31  Serum folate 5.6 and  B12 level 284  Serum iron 11, TIBC 447 and saturation is 2% Tora Duck is 2.   Studies/Results: Ct Abdomen Pelvis W Contrast  Result Date: 12/03/2016 CLINICAL DATA:  Oral contrast per protocol; RLQ pain; Pt sent from Dr Fletcher Anon office, pale ,weak, Hemoglobin 7.3, vomiting x 1 weeks, diarrhea, vaginal bleeding x 2 weeks^180mL ISOVUE-300 IOPAMIDOL (ISOVUE-300) INJECTION 61% EXAM: CT ABDOMEN AND  PELVIS WITH CONTRAST TECHNIQUE: Multidetector CT imaging of the abdomen and pelvis was performed using the standard protocol following bolus administration of intravenous contrast. CONTRAST:  ISOVUE-300 IOPAMIDOL (ISOVUE-300) INJECTION 61% COMPARISON:  06/15/2016 FINDINGS: Lower chest: No pulmonary nodules, pleural effusions, or infiltrates. Heart size is normal. No imaged pericardial effusion or significant coronary artery calcifications. Hepatobiliary: Status post cholecystectomy. Mild intrahepatic biliary duct dilatation. Common bile duct is prominent, measuring 1.3 cm. Dilatation is increased since prior study on 04/07/2015. Pancreas: Unremarkable. No pancreatic ductal dilatation or surrounding inflammatory changes. Spleen: Normal in size without focal abnormality. Adrenals/Urinary Tract: Adrenal glands are unremarkable. Kidneys are normal, without renal calculi, focal lesion, or hydronephrosis. Bladder is unremarkable. Stomach/Bowel: Status post gastric bypass surgery, unremarkable in appearance. Small bowel loops are unremarkable in appearance. The appendix is well seen and has a normal appearance. Early contrast filling of the large bowel limits evaluation of the colon. However, no focal abnormality identified. Vascular/Lymphatic: No significant vascular findings are present. Numerous small, nonspecific mesenteric lymph nodes are present. Reproductive: The uterus is present.  No adnexal mass. Other: No abdominal wall hernia or abnormality. No abdominopelvic ascites. Musculoskeletal: No acute or significant osseous findings. IMPRESSION: 1. Status post cholecystectomy. 2. Mild intra and extrahepatic biliary duct dilatation may be explained by the patient's postoperative state but is more prominent than typical and has increased. Correlation with hepatic enzymes recommended. Consider MRCP for further evaluation. 3. Uncomplicated appearance of gastric bypass surgery. 4. Small nonspecific mesenteric lymph  nodes. Electronically Signed   By: Norva Pavlov M.D.   On: 12/03/2016 21:57   Mr Abdomen Mrcp Wo Contrast  Result Date: 12/04/2016 CLINICAL DATA:  Dilated common bile duct. Patient status post cholecystectomy. EXAM: MRI ABDOMEN WITHOUT CONTRAST  (INCLUDING MRCP) TECHNIQUE: Multiplanar multisequence MR imaging of the abdomen was performed. Heavily T2-weighted images of the biliary and pancreatic ducts were obtained, and three-dimensional MRCP images were rendered by post processing. COMPARISON:  CT 12/03/2016 FINDINGS: Lower chest:  Lung bases are clear. Hepatobiliary: Mild intrahepatic duct dilatation. The common bile duct measures 7 mm in diameter. There is a low insertion of the cystic duct. No filling defect within the common bile duct. No external compression. There is no focal hepatic lesion. No hepatic steatosis. Patient status post cholecystectomy. Pancreas: Splenic parenchyma appears normal. There is no variant ductal anatomy identified. No duct dilatation or inflammation. Spleen: Normal spleen. Adrenals/urinary tract: Adrenal glands and kidneys are normal. Stomach/Bowel: Stomach and limited  of the small bowel is unremarkable Vascular/Lymphatic: Abdominal aortic normal caliber. No retroperitoneal periportal lymphadenopathy. Musculoskeletal: No aggressive osseous lesion IMPRESSION: 1. Mild intrahepatic and extrahepatic duct dilatation. Favor sequelae of cholecystectomy. 2. Low insertion of the cystic duct remnant. 3. Normal pancreas without evidence ductal variation or inflammation. 4. Normal hepatic parenchymal. Electronically Signed   By: Suzy Bouchard M.D.   On: 12/04/2016 11:16   Mr 3d Recon At Scanner  Result Date: 12/04/2016 CLINICAL DATA:  Dilated common bile duct. Patient status post cholecystectomy. EXAM: MRI ABDOMEN WITHOUT CONTRAST  (INCLUDING MRCP) TECHNIQUE: Multiplanar multisequence MR imaging of the abdomen was performed. Heavily T2-weighted images of the biliary and pancreatic  ducts were obtained, and three-dimensional MRCP images were rendered by post processing. COMPARISON:  CT 12/03/2016 FINDINGS: Lower chest:  Lung bases are clear. Hepatobiliary: Mild intrahepatic duct dilatation. The common bile duct measures 7 mm in diameter. There is a low insertion of the cystic duct. No filling defect within the common bile duct. No external compression. There is no focal hepatic lesion. No hepatic steatosis. Patient status post cholecystectomy. Pancreas: Splenic parenchyma appears normal. There is no variant ductal anatomy identified. No duct dilatation or inflammation. Spleen: Normal spleen. Adrenals/urinary tract: Adrenal glands and kidneys are normal. Stomach/Bowel: Stomach and limited of the small bowel is unremarkable Vascular/Lymphatic: Abdominal aortic normal caliber. No retroperitoneal periportal lymphadenopathy. Musculoskeletal: No aggressive osseous lesion IMPRESSION: 1. Mild intrahepatic and extrahepatic duct dilatation. Favor sequelae of cholecystectomy. 2. Low insertion of the cystic duct remnant. 3. Normal pancreas without evidence ductal variation or inflammation. 4. Normal hepatic parenchymal. Electronically Signed   By: Suzy Bouchard M.D.   On: 12/04/2016 11:16   Both CT and MR were reviewed and compared with prior CT from July 2017 and July 2016.  Assessment;  Patient is 30 year old Caucasian female with complicated GI history who was current problems can be summarized as follows:  #1. Anemia. She has experienced hematemesis prior to admission and her stool was positive yesterday (but negative today). She has received 2 units of PRBCs and hemoglobin has come from 7.7 g to 10.2. Otelia Sergeant studies confirmed iron deficiency anemia. IDA is primarily secondary to iron malabsorption in the setting of prior gastric bypass and GI blood loss. 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 However I would like to try by mouth iron one more  time.  #2. Dysphagia to solids. Her esophagus was dilated in July 2013 and in May 2016 with symptomatic relief. 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  #3. Elevated transaminases. She had AST and ALT of 20 and 23 respectively 2 days ago and today AST is 374 and ALT is 164 with AP of 205. Suspect microlithiasis or SOD dysfunction. Imaging studies reveal dilated biliary system but no evidence of choledocholithiasis. She was sent [facility] to [facility] in July 2016 for consideration of ERCP. He was seen by Dr. Liz Malady did not undergo any evaluation.   #4. Abdominal pain. Most of her pain is in epigastric region right upper quadrant and right low quadrant. Upper abdominal pain may be due to biliary tract disease or peptic ulcer disease. Right low quadrant abdominal pain is both superficial and deep and may be due to her neuropathy and she may have an IBS.  #5. Diarrhea 2 weeks duration. Stool studies are pending.  #6. 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 She is not taking any supplements.       She is also suspected to  have B12 deficiency.   Recommendations;  CBC and LFTs in a.m. Esophagogastroduodenoscopy with esophageal dilation under monitored anesthesia care to be performed by Dr.Fields, patient's primary gastroenterologist. I would also ask Dr.Fields to locate ampulla of Vater if she can.    LOS: 1 day   Vane Yapp  12/05/2016, 3:33 PM

## 2016-12-06 ENCOUNTER — Inpatient Hospital Stay (HOSPITAL_COMMUNITY): Payer: Medicaid Other | Admitting: Anesthesiology

## 2016-12-06 ENCOUNTER — Encounter (HOSPITAL_COMMUNITY)
Admission: EM | Disposition: A | Discharge status: Other Acute Inpatient Hospital | Payer: Self-pay | Source: Home / Self Care | Attending: Internal Medicine

## 2016-12-06 DIAGNOSIS — R112 Nausea with vomiting, unspecified: Secondary | ICD-10-CM

## 2016-12-06 DIAGNOSIS — D649 Anemia, unspecified: Secondary | ICD-10-CM

## 2016-12-06 DIAGNOSIS — R197 Diarrhea, unspecified: Secondary | ICD-10-CM

## 2016-12-06 HISTORY — PX: ESOPHAGOGASTRODUODENOSCOPY (EGD) WITH PROPOFOL: SHX5813

## 2016-12-06 LAB — HEPATIC FUNCTION PANEL
ALK PHOS: 246 U/L — AB (ref 38–126)
ALT: 461 U/L — ABNORMAL HIGH (ref 14–54)
AST: 455 U/L — ABNORMAL HIGH (ref 15–41)
Albumin: 3.4 g/dL — ABNORMAL LOW (ref 3.5–5.0)
Bilirubin, Direct: <0.1 mg/dL — ABNORMAL LOW (ref 0.1–0.5)
TOTAL PROTEIN: 6.1 g/dL — AB (ref 6.5–8.1)
Total Bilirubin: 0.1 mg/dL — ABNORMAL LOW (ref 0.3–1.2)

## 2016-12-06 LAB — ACETAMINOPHEN LEVEL: Acetaminophen (Tylenol), Serum: <10 ug/mL — ABNORMAL LOW (ref 10–30)

## 2016-12-06 LAB — CBC
HEMATOCRIT: 31.1 % — AB (ref 36.0–46.0)
HEMOGLOBIN: 9.7 g/dL — AB (ref 12.0–15.0)
MCH: 25 pg — ABNORMAL LOW (ref 26.0–34.0)
MCHC: 31.2 g/dL (ref 30.0–36.0)
MCV: 80.2 fL (ref 78.0–100.0)
Platelets: 254 10*3/uL (ref 150–400)
RBC: 3.88 MIL/uL (ref 3.87–5.11)
RDW: 17 % — ABNORMAL HIGH (ref 11.5–15.5)
WBC: 4 10*3/uL (ref 4.0–10.5)

## 2016-12-06 SURGERY — ESOPHAGOGASTRODUODENOSCOPY (EGD) WITH PROPOFOL
Anesthesia: Monitor Anesthesia Care

## 2016-12-06 MED ORDER — PROPOFOL 500 MG/50ML IV EMUL
INTRAVENOUS | Status: DC | PRN
  Administered 2016-12-06: 12:00:00 via INTRAVENOUS
  Administered 2016-12-06: 150 ug/kg/min via INTRAVENOUS

## 2016-12-06 MED ORDER — GLYCOPYRROLATE 0.2 MG/ML IJ SOLN
INTRAMUSCULAR | Status: AC
  Filled 2016-12-06: qty 1

## 2016-12-06 MED ORDER — LIDOCAINE HCL (CARDIAC) 10 MG/ML IV SOLN
INTRAVENOUS | Status: DC | PRN
  Administered 2016-12-06: 40 mg via INTRAVENOUS

## 2016-12-06 MED ORDER — LIDOCAINE VISCOUS 2 % MT SOLN
OROMUCOSAL | Status: DC | PRN
Start: 2016-12-06 — End: 2016-12-06
  Administered 2016-12-06: 5 mL via OROMUCOSAL

## 2016-12-06 MED ORDER — PROPOFOL 10 MG/ML IV BOLUS
INTRAVENOUS | Status: AC
  Filled 2016-12-06: qty 20

## 2016-12-06 MED ORDER — MIDAZOLAM HCL 5 MG/5ML IJ SOLN
INTRAMUSCULAR | Status: DC | PRN
  Administered 2016-12-06: 2 mg via INTRAVENOUS

## 2016-12-06 MED ORDER — GLYCOPYRROLATE 0.2 MG/ML IJ SOLN
INTRAMUSCULAR | Status: DC | PRN
  Administered 2016-12-06: 0.2 mg via INTRAVENOUS

## 2016-12-06 MED ORDER — LACTATED RINGERS IV SOLN
INTRAVENOUS | Status: DC | PRN
  Administered 2016-12-06: 12:00:00 via INTRAVENOUS

## 2016-12-06 MED ORDER — MINERAL OIL PO OIL
TOPICAL_OIL | ORAL | Status: AC
  Filled 2016-12-06: qty 30

## 2016-12-06 MED ORDER — LIDOCAINE VISCOUS 2 % MT SOLN
OROMUCOSAL | Status: AC
  Filled 2016-12-06: qty 15

## 2016-12-06 MED ORDER — HYDROMORPHONE HCL 1 MG/ML IJ SOLN
1.0000 mg | INTRAMUSCULAR | Status: DC | PRN
  Administered 2016-12-06 – 2016-12-08 (×13): 1 mg via INTRAVENOUS
  Filled 2016-12-06 (×15): qty 1

## 2016-12-06 MED ORDER — MIDAZOLAM HCL 2 MG/2ML IJ SOLN
INTRAMUSCULAR | Status: AC
  Filled 2016-12-06: qty 2

## 2016-12-06 NOTE — Op Note (Signed)
[facility] Patient Name: Penny [name] Procedure Date: 12/06/2016 11:06 AM MRN: [id number] Date of Birth: 1986/07/24 Attending MD: Barney Drain , MD CSN: [id number] Age: 31 Admit Type: Outpatient Procedure:                Upper GI endoscopy, DIAGNOSTIC Indications:              Dysphagia, Diarrhea, Nausea with vomiting. PSHx:                            GASTRIC BYPASS SURGERY 2007. Providers:                Barney Drain, MD, Gwynneth Albright RN, RN,                            Isabella Stalling, Technician Referring MD:             Elayne Snare Luking Medicines:                Propofol per Anesthesia Complications:            No immediate complications. Estimated Blood Loss:     Estimated blood loss: none. Procedure:                Pre-Anesthesia Assessment:                           - Prior to the procedure, a History and Physical                            was performed, and patient medications and                            allergies were reviewed. The patient's tolerance of                            previous anesthesia was also reviewed. The risks                            and benefits of the procedure and the sedation                            options and risks were discussed with the patient.                            All questions were answered, and informed consent                            was obtained. Prior Anticoagulants: The patient has                            taken naproxen. ASA Grade Assessment: II - A                            patient with mild systemic disease. After reviewing  the risks and benefits, the patient was deemed in                            satisfactory condition to undergo the procedure.                            After obtaining informed consent, the endoscope was                            passed under direct vision. Throughout the                            procedure, the patient's blood pressure, pulse, and                             oxygen saturations were monitored continuously. The                            [id number]) scope was introduced through the                            mouth, and advanced to the afferent and efferent                            jejunal loops. The upper GI endoscopy was                            accomplished without difficulty. The patient                            tolerated the procedure well. Scope In: 11:56:45 AM Scope Out: 12:03:03 PM Total Procedure Duration: 0 hours 6 minutes 18 seconds  Findings:      The examined esophagus was normal.      No gross lesions were noted in the cardia, BUT GASTRIC POUCH APPEARANCE       SUGGESTS TORSION.      The examined jejunum was normal. BOTH THE EFFERENT AND AFFERENT LIMBS       WERE EXAMINED AND THE SCOPED WAS PASSED IN BOTH LIMBS TO 90 CM FROM THE       INCISORS. UNABLE TO IDENTIFY THE AMPULLA. Impression:               - Normal esophagus.                           - POSSIBLE TORSION OF GASTRIC REMNANT                           - Normal examined jejunum. Moderate Sedation:      Per Anesthesia Care Recommendation:           - Return patient to hospital ward.                           - Full liquid diet.                           -  Continue present medications.                           - UGI SERIES TOMORROW TO EVALUATE FOR GASTRIC                            TORSION. Procedure Code(s):        --- Professional ---                           [id number], Esophagogastroduodenoscopy, flexible,                            transoral; diagnostic, including collection of                            specimen(s) by brushing or washing, when performed                            (separate procedure) Diagnosis Code(s):        --- Professional ---                           R19.7, Diarrhea, unspecified                           R11.2, Nausea with vomiting, unspecified CPT copyright 2016 American Medical Association. All rights  reserved. The codes documented in this report are preliminary and upon coder review may  be revised to meet current compliance requirements. Barney Drain, MD Barney Drain, MD 12/06/2016 12:16:54 PM This report has been signed electronically. Number of Addenda: 0

## 2016-12-06 NOTE — Anesthesia Procedure Notes (Signed)
Procedure Name: MAC Date/Time: 12/06/2016 11:41 AM Performed by: Andree Elk, [place] Kenley A Pre-anesthesia Checklist: Patient identified, Emergency Drugs available, Suction available, Patient being monitored and Timeout performed Oxygen Delivery Method: Simple face mask

## 2016-12-06 NOTE — H&P (Addendum)
Primary Care Physician:  [name], MD Primary Gastroenterologist:  Dr. Oneida Alar  Pre-Procedure History & Physical: HPI:  Penny [name] is a 31 y.o. female here for anemia and dysphagia: SOLID AND LIQUID. HAVING NAUSEA AND VOMITING FOR PAST 2 WEEKS. SAW BLOOD IN VOMIT.  LAST TIME 2-3 DAYS AGO. USES ALEVE PRN. ALSO BLOODY DIARRHEA: 5-6/DAYA ND NOCTURNAL STOOLS. NO NEW MEDS OR OTC PRODUCT. OCCASIONAL CHEST PAIN AND SOB. HEARTBURN/INDIGESTION NOT CONTROLLED BUT TAKES HER OMEPRAZOLE. WEIGHT DOWN BUT BMI > 35.  PT DENIES FEVER, CHILLS, melena,  CHANGE IN BOWEL IN HABITS, constipation, OR abdominal pain.   Past Medical History:  Diagnosis Date  . ADD (attention deficit disorder)   . Anemia 2011   2o to GASTRIC BYPASS  . Anginal pain ([place])   . Anxiety   . Blood transfusion without reported diagnosis   . Chronic daily headache   . Depression   . Depression   . Dysmenorrhea 09/18/2013  . Dysrhythmia   . Elevated liver enzymes JUL 2011ALK PHOS 111-127 AST  143-267 ALT  213-321T BILI 0.6  ALB  3.7-4.06 Jun 2011 ALK PHOS 118 AST 24 ALT 42 T BILI 0.4 ALB 3.9  . Encounter for drug rehabilitation    at behavioral health for opioid addiction about 4 years ago  . Family history of adverse reaction to anesthesia    'dad had to be kept on pump for breathing for morphine'  . Fatty liver   . Fibromyalgia   . Gastric bypass status for obesity   . Gastritis JULY 2011  . Heart murmur   . 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 History of Holter monitoring   . Hx of opioid abuse    for about 4 years, about 4 years ago  . Interstitial cystitis   . Iron deficiency anemia 07/23/2010  . Irritable bowel syndrome 2012 DIARRHEA   JUN 2012 TTG IgA 14.9  . IUD FEB 2010  . Lupus   . Menorrhagia 07/18/2013  . Migraines   . Obesity (BMI 30-39.9) 2011 228 LBS BMI 36.8  . Ovarian cyst   . Patient desires pregnancy 09/18/2013  . PONV (postoperative nausea and vomiting) seizure  post-operatively  . Potassium (K) deficiency   . Pregnant 12/25/2013  . Psychiatric pseudoseizure   . RLQ abdominal pain 07/18/2013  . Sciatica of left side 11/14/2014  . 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 Stress 09/18/2013    Past Surgical History:  Procedure Laterality Date  . BIOPSY  04/10/2015   Procedure: BIOPSY;  Surgeon: Daneil Dolin, MD;  Location: [facility];  Service: Endoscopy;;  . cath self every nite     for sodium bicarb injection (discontinued 2013)  . CHOLECYSTECTOMY  2005   biliary dyskinesia  . COLONOSCOPY  JUN 2012 ABD PN/DIARRHEA WITH PROPOFOL   NL COLON  . DILATION AND CURETTAGE OF UTERUS    . ESOPHAGEAL DILATION N/A 04/10/2015   Procedure: ESOPHAGEAL DILATION WITH 54FR MALONEY DILATOR;  Surgeon: Daneil Dolin, MD;  Location: [facility];  Service: Endoscopy;  Laterality: N/A;  . ESOPHAGOGASTRODUODENOSCOPY    . ESOPHAGOGASTRODUODENOSCOPY (EGD) WITH PROPOFOL N/A 04/10/2015   Procedure: ESOPHAGOGASTRODUODENOSCOPY (EGD) WITH PROPOFOL;  Surgeon: Daneil Dolin, MD;  Location: [facility];  Service: Endoscopy;  Laterality: N/A;  . GAB  2007   in [place]-POUCH 5 CM  . GASTRIC BYPASS  06/2006  . HYSTEROSCOPY W/D&C N/A 09/12/2014   Procedure: DILATATION AND CURETTAGE /HYSTEROSCOPY;  Surgeon:  Jonnie Kind, MD;  Location: [facility];  Service: Gynecology;  Laterality: N/A;  . REPAIR VAGINAL CUFF N/A 07/30/2014   Procedure: REPAIR VAGINAL CUFF;  Surgeon: Mora Bellman, MD;  Location: [place] [facility];  Service: Gynecology;  Laterality: N/A;  . SAVORY DILATION  06/20/2012   Dr. Barnie Alderman gastritis/Ulcer in the mid jejunum. Empiric dilation.   . SURAL NERVE BX Left 02/25/2016   Procedure: LEFT SURAL NERVE BIOPSY;  Surgeon: Jovita Gamma, MD;  Location: [place] [facility];  Service: Neurosurgery;  Laterality: Left;  Left sural nerve biopsy  . TONSILLECTOMY    . TONSILLECTOMY AND ADENOIDECTOMY    . UPPER GASTROINTESTINAL ENDOSCOPY  JULY 2011 NAUSEA-D125,V6, PH 25   Bx; GASTRITIS, POUCH-5 CM  LONG  . WISDOM TOOTH EXTRACTION      Prior to Admission medications   Medication Sig Start Date End Date Taking? Authorizing Provider  albuterol (PROVENTIL HFA;VENTOLIN HFA) 108 (90 Base) MCG/ACT inhaler Inhale 2 puffs into the lungs every 6 (six) hours as needed for wheezing. 05/28/16  Yes Kathyrn Drown, MD  amphetamine-dextroamphetamine (ADDERALL XR) 30 MG 24 hr capsule Take 1 capsule (30 mg total) by mouth daily. 10/27/16  Yes Cloria Spring, MD  APAP-Pamabrom-Pyrilamine (MENSTRUAL PAIN RELIEF) 500-25-15 MG TABS Take 1-2 tablets by mouth as needed (for pain). Reported on 06/09/2016   Yes Historical Provider, MD  carbamazepine (TEGRETOL) 200 MG tablet Take two tablets at bedtime Patient taking differently: Take 400 mg by mouth at bedtime.  10/27/16  Yes Cloria Spring, MD  clonazePAM (KLONOPIN) 0.5 MG tablet Take 1 tablet (0.5 mg total) by mouth 2 (two) times daily. 10/27/16 10/27/17 Yes Cloria Spring, MD  DULoxetine (CYMBALTA) 60 MG capsule Take 1 capsule (60 mg total) by mouth 2 (two) times daily. 10/27/16  Yes Cloria Spring, MD  HYDROcodone-acetaminophen [facility]) 10-325 MG tablet Take 1 tablet by mouth every 6 (six) hours as needed. 09/28/16  Yes Cloria Spring, MD  pantoprazole (PROTONIX) 40 MG tablet Take 1 tablet (40 mg total) by mouth daily. 06/10/16  Yes Kathyrn Drown, MD  tiZANidine (ZANAFLEX) 2 MG tablet Take 1 tablet by mouth TID  prn Patient taking differently: Take 2 mg by mouth every 8 (eight) hours as needed for muscle spasms.  09/02/16  Yes Kathyrn Drown, MD  topiramate (TOPAMAX) 50 MG tablet TAKE 1 TABLET BY MOUTH EACH MORNING AND 2 TABLETS IN THE EVENING AS NEEDED FOR MIGRAINES. Patient taking differently: Take 50-100 mg by mouth 2 (two) times daily. TAKE 1 TABLET BY MOUTH EACH MORNING AND 2 TABLETS IN THE EVENING AS NEEDED FOR MIGRAINES. 03/09/16  Yes Kathyrn Drown, MD  amphetamine-dextroamphetamine (ADDERALL XR) 30 MG 24 hr capsule Take 1 capsule (30 mg total) by mouth  daily. Patient not taking: Reported on 12/03/2016 10/27/16   Cloria Spring, MD    Allergies as of 12/03/2016 - Review Complete 12/03/2016  Allergen Reaction Noted  . Gabapentin Other (See Comments)   . Metoclopramide hcl Anxiety and Other (See Comments) 05/27/2011  . Tramadol Other (See Comments) 11/20/2015  . Morphine and related Other (See Comments) 06/11/2015  . Nucynta [tapentadol]  04/21/2016  . Propofol Nausea And Vomiting 02/24/2016  . Trazodone and nefazodone Other (See Comments) 02/23/2016  . Zofran [ondansetron] Other (See Comments) 12/30/2014  . Butrans [buprenorphine]  04/21/2016  . Latex Rash   . Lyrica [pregabalin]  04/21/2016  . Tape Itching and Rash 03/08/2012    Family History  Problem Relation Age of  Onset  . Hemochromatosis Maternal Grandmother   . Migraines Maternal Grandmother   . Cancer Maternal Grandmother   . Breast cancer Maternal Grandmother   . Hypertension Father   . Diabetes Father   . Coronary artery disease Father   . Migraines Paternal Grandmother   . Breast cancer Paternal Grandmother   . Cancer Mother     breast  . Hemochromatosis Mother   . Breast cancer Mother   . Depression Mother   . Anxiety disorder Mother   . Coronary artery disease Paternal Grandfather   . Anxiety disorder Brother   . Bipolar disorder Brother   . Healthy Son   . Healthy Daughter     Social History   Social History  . Marital status: Single    Spouse name: N/A  . Number of children: 2  . Years of education: some colle   Occupational History  .  Not Employed   Social History Main Topics  . Smoking status: Current Every Day Smoker    Packs/day: 0.10    Years: 6.00    Types: Cigarettes  . Smokeless tobacco: Never Used     Comment: Patient is down to 1 a day and quiting on her own  . Alcohol use 0.0 oz/week     Comment: once every weeks - maybe a beer with a dinner, rarely  . Drug use: No     Comment: Previously opioid addiction went through rehab   . Sexual activity: Yes    Partners: Male    Birth control/ protection: None   Other Topics Concern  . Not on file   Social History Narrative   Patient is right handed.   Patient drinks one cup of coffee daily.   She lives in a one-story home with her two children (75 year old son, 40 month old daughter).   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 Neck:  Supple; Lungs:  Clear throughout to auscultation.    Heart:  Regular rate and rhythm. Abdomen:  Soft, nontender and nondistended. Normal bowel sounds, without guarding, and without rebound.   Neurologic:  Alert and  oriented x4;  grossly normal neurologically.  Impression/Plan:     Anemia/DYSPHAGIA  PLAN:  1. EGD/POSSIBLE DILATION TODAY. DISCUSSED PROCEDURE, BENEFITS, & RISKS: < 1% chance of medication reaction, perforation, OR bleeding.

## 2016-12-06 NOTE — Anesthesia Preprocedure Evaluation (Addendum)
Anesthesia Evaluation  Patient identified by MRN, date of birth, ID band Patient awake    Reviewed: Allergy & Precautions, NPO status , Patient's Chart, lab work & pertinent test results  History of Anesthesia Complications (+) PONV  Airway Mallampati: II  TM Distance: >3 FB Neck ROM: Full    Dental  (+) Teeth Intact   Pulmonary Current Smoker,           Cardiovascular  Rate:Normal     Neuro/Psych  Headaches, Seizures -, Well Controlled,  Anxiety Depression    GI/Hepatic   Endo/Other    Renal/GU      Musculoskeletal  (+) Fibromyalgia -  Abdominal   Peds  Hematology   Anesthesia Other Findings   Reproductive/Obstetrics Negative urine pregnancy test                             Anesthesia Physical Anesthesia Plan  ASA: II and emergent  Anesthesia Plan: MAC   Post-op Pain Management:    Induction:   Airway Management Planned: Simple Face Mask  Additional Equipment:   Intra-op Plan:   Post-operative Plan:   Informed Consent:   Dental advisory given  Plan Discussed with: Anesthesiologist and Surgeon  Anesthesia Plan Comments: (Patient has Propofol as stated allergy; viewing previous anesthetic records, it appears she has tolerated Propofol in the past without difficulty; Discussed with patient and she is agreeable to proceed using Propofol.)       Anesthesia Quick Evaluation

## 2016-12-06 NOTE — Progress Notes (Signed)
PROGRESS NOTE    STEPAHNIE [name]  [id number] DOB: 1986-10-07 DOA: 12/03/2016 PCP: [name], MD    Brief Narrative:  31 year old female with history of gastric bypass, migraines, pseudoseizures, we'll refer to the emergency room due to low hemoglobin found at her primary care office. She's been feeling increasingly weak for 2-3 weeks prior to admission and has had persistent nausea, vomiting and diarrhea. She also had right-sided abdominal pain. Hemoglobin was found to be 7.3. Abdominal imaging indicated dilated extrahepatic ducts. She was referred for admission.   Assessment & Plan:   Principal Problem:   Symptomatic anemia Active Problems:   ADD (attention deficit disorder) without hyperactivity   Abdominal pain, acute, right lower quadrant   Pseudoseizures   Chronic migraine without aura   Folate deficiency   Nausea vomiting and diarrhea   Severe anemia   Chronic pain syndrome   1. Symptomatic anemia. Patient received 2 units of PRBCs on admission with improvement of hemoglobin. Anemia is related to iron deficiency which is likely a combination of chronic blood loss due to menstrual cycles as well as diminished absorption due to her GI surgeries. She's not been taking any supplemental iron.   2. Diarrhea. GI pathogen panel negative. She has not had further diarrhea since admission making C diff unlikely. Will discontinue enteric precautions  3. B-12 deficiency. Patient has not been taking any B-12 supplementation since her GI surgery. She will need lifelong replacement.  4. Dysphagia and history of esophageal dilatations. Seen by gastroenterology and underwent EGD with results as below.  5. Elevated liver enzymes. Patient is status post cholecystectomy. CT and MRI imaging of her abdomen indicated dilated hepatic ducts, no evidence of choledocholithiasis. 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 GI following. Hepatitis panel in process. Recheck in AM, currently LFTs are  rising.  6. Chronic pain syndrome. She follows with a pain clinic in [place].  7. History of migraines. Continue on Topamax  8. History of pseudoseizures. On Tegretol.  9. Abdominal pain. Patient has significant right sided abdominal pain. May have neuropathic component.    DVT prophylaxis: scds Code Status: Full code Family Communication: No family present Disposition Plan: Discharge home once improved   Consultants:   Gastroenterology  Procedures:  EGD: - Normal esophagus.                           - POSSIBLE TORSION OF GASTRIC REMNANT                            - Normal examined jejunum.  Antimicrobials:      Subjective: Reports no diarrhea since admission. Feels nauseous. Continued abdominal pain  Objective: Vitals:   12/06/16 0500 12/06/16 1210 12/06/16 1215 12/06/16 1342  BP: 116/75 140/88 140/89 132/76  Pulse: 61 70 62 64  Resp: 18 (!) 9 12 18   Temp: 97.5 F (36.4 C) 98.2 F (36.8 C)  97.8 F (36.6 C)  TempSrc: Oral   Oral  SpO2: 100% 100% 99% 100%  Weight:      Height:        Intake/Output Summary (Last 24 hours) at 12/06/16 1742 Last data filed at 12/06/16 1345  Gross per 24 hour  Intake          2516.67 ml  Output             1550 ml  Net  966.67 ml   Filed Weights   12/03/16 1728 12/04/16 0056  Weight: 99.8 kg (220 lb) 101.2 kg (223 lb 3.2 oz)    Examination:  General exam: Appears calm and comfortable  Respiratory system: Clear to auscultation. Respiratory effort normal. Cardiovascular system: S1 & S2 heard, RRR. No JVD, murmurs, rubs, gallops or clicks. No pedal edema. Gastrointestinal system: Abdomen is nondistended, soft and significantly tender in the right upper and lower quadrants. No organomegaly or masses felt. Normal bowel sounds heard. Central nervous system: Alert and oriented. No focal neurological deficits. Extremities: Symmetric 5 x 5 power. Skin: No rashes, lesions or ulcers Psychiatry: Judgement and  insight appear normal. Mood & affect appropriate.     Data Reviewed: I have personally reviewed following labs and imaging studies  CBC:  Recent Labs Lab 12/03/16 1601 12/03/16 1746 12/04/16 1124 12/05/16 0658 12/06/16 0429  WBC  --  6.9 4.7 4.7 4.0  NEUTROABS  --  4.1  --   --   --   HGB 7.3* 7.7* 9.3* 10.2* 9.7*  HCT  --  25.9* 29.6* 33.2* 31.1*  MCV  --  77.3* 80.0 81.0 80.2  PLT  --  317 262 264 0000000   Basic Metabolic Panel:  Recent Labs Lab 12/03/16 1746 12/04/16 1124 12/05/16 0658  NA 136 138 139  K 3.3* 3.4* 3.4*  CL 109 109 111  CO2 21* 24 23  GLUCOSE 92 98 100*  BUN 14 8 <5*  CREATININE 0.55 0.53 0.49  CALCIUM 8.5* 8.2* 8.2*   GFR: Estimated Creatinine Clearance: 124.7 mL/min (by C-G formula based on SCr of 0.49 mg/dL). Liver Function Tests:  Recent Labs Lab 12/03/16 1746 12/05/16 0658 12/06/16 0429  AST 20 374* 455*  ALT 23 164* 461*  ALKPHOS 70 205* 246*  BILITOT 0.4 0.5 0.1*  PROT 6.9 6.1* 6.1*  ALBUMIN 3.7 3.3* 3.4*    Recent Labs Lab 12/03/16 1746  LIPASE 31   No results for input(s): AMMONIA in the last 168 hours. Coagulation Profile: No results for input(s): INR, PROTIME in the last 168 hours. Cardiac Enzymes: No results for input(s): CKTOTAL, CKMB, CKMBINDEX, TROPONINI in the last 168 hours. BNP (last 3 results) No results for input(s): PROBNP in the last 8760 hours. HbA1C: No results for input(s): HGBA1C in the last 72 hours. CBG: No results for input(s): GLUCAP in the last 168 hours. Lipid Profile: No results for input(s): CHOL, HDL, LDLCALC, TRIG, CHOLHDL, LDLDIRECT in the last 72 hours. Thyroid Function Tests: No results for input(s): TSH, T4TOTAL, FREET4, T3FREE, THYROIDAB in the last 72 hours. Anemia Panel:  Recent Labs  12/03/16 1746 12/03/16 1747  VITAMINB12 284  --   FOLATE  --  5.6*  FERRITIN 2*  --   TIBC 447  --   IRON 11*  --   RETICCTPCT 1.4  --    Sepsis Labs:  Recent Labs Lab 12/03/16 1747    LATICACIDVEN 0.9    Recent Results (from the past 240 hour(s))  Gastrointestinal Panel by PCR , Stool     Status: None   Collection Time: 12/04/16  2:01 AM  Result Value Ref Range Status   Campylobacter species NOT DETECTED NOT DETECTED Final   Plesimonas shigelloides NOT DETECTED NOT DETECTED Final   Salmonella species NOT DETECTED NOT DETECTED Final   Yersinia enterocolitica NOT DETECTED NOT DETECTED Final   Vibrio species NOT DETECTED NOT DETECTED Final   Vibrio cholerae NOT DETECTED NOT DETECTED Final   Enteroaggregative E  coli (EAEC) NOT DETECTED NOT DETECTED Final   Enteropathogenic E coli (EPEC) NOT DETECTED NOT DETECTED Final   Enterotoxigenic E coli (ETEC) NOT DETECTED NOT DETECTED Final   Shiga like toxin producing E coli (STEC) NOT DETECTED NOT DETECTED Final   Shigella/Enteroinvasive E coli (EIEC) NOT DETECTED NOT DETECTED Final   Cryptosporidium NOT DETECTED NOT DETECTED Final   Cyclospora cayetanensis NOT DETECTED NOT DETECTED Final   Entamoeba histolytica NOT DETECTED NOT DETECTED Final   Giardia lamblia NOT DETECTED NOT DETECTED Final   Adenovirus F40/41 NOT DETECTED NOT DETECTED Final   Astrovirus NOT DETECTED NOT DETECTED Final   Norovirus GI/GII NOT DETECTED NOT DETECTED Final   Rotavirus A NOT DETECTED NOT DETECTED Final   Sapovirus (I, II, IV, and V) NOT DETECTED NOT DETECTED Final         Radiology Studies: No results found.      Scheduled Meds: . amphetamine-dextroamphetamine  30 mg Oral Daily  . carbamazepine  400 mg Oral QHS  . clonazePAM  0.5 mg Oral BID  . DULoxetine  60 mg Oral BID  . folic acid  1 mg Oral Daily  . lidocaine      . pantoprazole  40 mg Oral Daily  . simethicone  160 mg Oral QID   Continuous Infusions: . sodium chloride 100 mL/hr at 12/06/16 1503     LOS: 2 days    Time spent: 95mins    MEMON,JEHANZEB, MD [facility] Pager [phone]  If 7PM-7AM, please contact night-coverage [email or web address] Password  [email or web address] 12/06/2016, 5:42 PM

## 2016-12-06 NOTE — Anesthesia Postprocedure Evaluation (Signed)
Anesthesia Post Note  [name]  Procedure(s) Performed: Procedure(s) (LRB): ESOPHAGOGASTRODUODENOSCOPY (EGD) WITH PROPOFOL (N/A)  Patient location during evaluation: PACU Anesthesia Type: MAC Level of consciousness: awake and alert and oriented Pain management: pain level controlled Vital Signs Assessment: post-procedure vital signs reviewed and stable Respiratory status: spontaneous breathing Cardiovascular status: stable Postop Assessment: no signs of nausea or vomiting Anesthetic complications: no     Last Vitals:  Vitals:   12/06/16 0500 12/06/16 1210  BP: 116/75 140/88  Pulse: 61 70  Resp: 18 (!) 9  Temp: 36.4 C (P) 36.8 C    Last Pain:  Vitals:   12/06/16 1132  TempSrc:   PainSc: 9                  Drisana Schweickert A

## 2016-12-06 NOTE — Transfer of Care (Signed)
Immediate Anesthesia Transfer of Care Note  [name]  Procedure(s) Performed: Procedure(s): ESOPHAGOGASTRODUODENOSCOPY (EGD) WITH PROPOFOL (N/A)  Patient Location: PACU  Anesthesia Type:MAC  Level of Consciousness: awake, alert , oriented and patient cooperative  Airway & Oxygen Therapy: Patient Spontanous Breathing and Patient connected to nasal cannula oxygen  Post-op Assessment: Report given to RN and Post -op Vital signs reviewed and stable  Post vital signs: Reviewed and stable  Last Vitals:  Vitals:   12/05/16 2100 12/06/16 0500  BP: 119/71 116/75  Pulse: 67 61  Resp: 18 18  Temp: 36.7 C 36.4 C    Last Pain:  Vitals:   12/06/16 1132  TempSrc:   PainSc: 9       Patients Stated Pain Goal: 2 (46/50/35 4656)  Complications: No apparent anesthesia complications

## 2016-12-07 ENCOUNTER — Inpatient Hospital Stay (HOSPITAL_COMMUNITY): Payer: Medicaid Other

## 2016-12-07 DIAGNOSIS — G894 Chronic pain syndrome: Secondary | ICD-10-CM

## 2016-12-07 DIAGNOSIS — E538 Deficiency of other specified B group vitamins: Secondary | ICD-10-CM

## 2016-12-07 LAB — PROTIME-INR
INR: 0.98
Prothrombin Time: 13 s (ref 11.4–15.2)

## 2016-12-07 LAB — HEPATIC FUNCTION PANEL
ALK PHOS: 210 U/L — AB (ref 38–126)
ALT: 276 U/L — AB (ref 14–54)
AST: 115 U/L — AB (ref 15–41)
Albumin: 3.2 g/dL — ABNORMAL LOW (ref 3.5–5.0)
Bilirubin, Direct: <0.1 mg/dL — ABNORMAL LOW (ref 0.1–0.5)
TOTAL PROTEIN: 6 g/dL — AB (ref 6.5–8.1)
Total Bilirubin: 0.3 mg/dL (ref 0.3–1.2)

## 2016-12-07 LAB — HOMOCYSTEINE

## 2016-12-07 LAB — HEPATITIS PANEL, ACUTE
HCV AB: 0.1 {s_co_ratio} (ref 0.0–0.9)
HEP B S AG: NEGATIVE
Hep A IgM: NEGATIVE
Hep B C IgM: NEGATIVE

## 2016-12-07 MED ORDER — PANTOPRAZOLE SODIUM 40 MG IV SOLR
40.0000 mg | INTRAVENOUS | Status: DC
  Administered 2016-12-08: 40 mg via INTRAVENOUS
  Filled 2016-12-07: qty 40

## 2016-12-07 MED ORDER — KETOROLAC TROMETHAMINE 30 MG/ML IJ SOLN
30.0000 mg | Freq: Four times a day (QID) | INTRAMUSCULAR | Status: DC | PRN
  Administered 2016-12-07 – 2016-12-08 (×2): 30 mg via INTRAVENOUS
  Filled 2016-12-07 (×2): qty 1

## 2016-12-07 MED ORDER — DICYCLOMINE HCL 10 MG PO CAPS
20.0000 mg | ORAL_CAPSULE | Freq: Three times a day (TID) | ORAL | Status: DC
  Administered 2016-12-08: 20 mg via ORAL
  Filled 2016-12-07 (×2): qty 2

## 2016-12-07 MED ORDER — PROCHLORPERAZINE EDISYLATE 5 MG/ML IJ SOLN
10.0000 mg | Freq: Four times a day (QID) | INTRAMUSCULAR | Status: DC | PRN
  Administered 2016-12-07: 10 mg via INTRAVENOUS
  Filled 2016-12-07: qty 2

## 2016-12-07 MED ORDER — FAMOTIDINE IN NACL 20-0.9 MG/50ML-% IV SOLN
20.0000 mg | Freq: Two times a day (BID) | INTRAVENOUS | Status: DC
  Administered 2016-12-07 – 2016-12-08 (×3): 20 mg via INTRAVENOUS
  Filled 2016-12-07 (×3): qty 50

## 2016-12-07 NOTE — Progress Notes (Signed)
I did not know until this afternoon the patient is no longer an [facility] patient. When I saw patient 2 days ago she did not provide this information to me. I have reviewed LFTs and upper GI results with patient over the phone. With continuing pain and fluctuating transaminases I am concerned that she has SOD dysfunction or microlithiasis. Will repeat ultrasound in a.m. Bile duct diameter has increased Will arrange for PTC. Since patient continues to vomit I will recommend making her nothing by mouth. Will discuss with Dr. Roderic Palau.

## 2016-12-07 NOTE — Progress Notes (Signed)
PROGRESS NOTE    Penny [name]  WGN:[id number] DOB: Jan 14, 1986 DOA: 12/03/2016 PCP: [name], MD    Brief Narrative:  31 year old female with history of gastric bypass, migraines, pseudoseizures, we'll refer to the emergency room due to low hemoglobin found at her primary care office. She's been feeling increasingly weak for 2-3 weeks prior to admission and has had persistent nausea, vomiting and diarrhea. She also had right-sided abdominal pain. Hemoglobin was found to be 7.3. Abdominal imaging indicated dilated extrahepatic ducts. Patient was admitted and transfused 2 units PRBC with improvement of hemoglobin. Abdominal imaging including CT, MRI, EGD and upper GI series have been unremarkable. Gastroenterology is following. She continues to have abdominal pain, nausea and vomiting..   Assessment & Plan:   Principal Problem:   Symptomatic anemia Active Problems:   ADD (attention deficit disorder) without hyperactivity   Abdominal pain, acute, right lower quadrant   Pseudoseizures   Chronic migraine without aura   Folate deficiency   Nausea vomiting and diarrhea   Severe anemia   Chronic pain syndrome   1. Symptomatic anemia. Patient received 2 units of PRBCs on admission with improvement of hemoglobin. Anemia is related to iron deficiency which is likely a combination of chronic blood loss due to menstrual cycles as well as diminished absorption due to her GI surgeries. She's not been taking any supplemental iron.   2. Diarrhea. GI pathogen panel negative. She has not had further diarrhea since admission making C diff unlikely. Will discontinue enteric precautions  3. B-12 deficiency. Patient has not been taking any B-12 supplementation since her GI surgery. She will need lifelong replacement.  4. Dysphagia and history of esophageal dilatations. Seen by gastroenterology and underwent EGD with results as below. She continues to complain of significant nausea and vomiting. This  is worse after she eats or drink anything. I suspect she is being noncompliant with bariatric diet, having increased portion sizes that are leading her to vomit. Will request nutritional consult for diet education  5. Elevated liver enzymes. Patient is status post cholecystectomy. CT and MRI imaging of her abdomen indicated dilated hepatic ducts, no evidence of choledocholithiasis. 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 GI following. Hepatitis panel in process. Liver enzymes this morning are trending down.  6. Chronic pain syndrome. She follows with a pain clinic in [place]. She was told that she will not be prescribed any pain medications on discharge.  7. History of migraines. Continue on Topamax  8. History of pseudoseizures. On Tegretol.  9. Abdominal pain. Patient has significant right sided abdominal pain. Possibly related to IBS. I suspect some of her abdominal discomfort may be related to her recurrent vomiting. I have started the patient on dicyclomine . Await further input from GI   DVT prophylaxis: scds Code Status: Full code Family Communication: No family present Disposition Plan: Discharge home once improved   Consultants:   Gastroenterology  Procedures:  EGD: - Normal esophagus.                           - POSSIBLE TORSION OF GASTRIC REMNANT                            - Normal examined jejunum.  Antimicrobials:      Subjective: Patient complains of continued abdominal pain. She feels that her pain is worse after trying to eat or drink anything. She is also having significant  nausea and dry heaving. Staff reports that she had 2 portions of grits this morning  Objective: Vitals:   12/06/16 2150 12/06/16 2158 12/07/16 0531 12/07/16 1321  BP: 113/76  106/61 (!) 115/56  Pulse: 78  65 89  Resp: 20  18 20   Temp: 97.8 F (36.6 C)  97.6 F (36.4 C) 97.8 F (36.6 C)  TempSrc: Oral  Oral Oral  SpO2: 98% 95% 100% 100%  Weight:      Height:         Intake/Output Summary (Last 24 hours) at 12/07/16 1626 Last data filed at 12/07/16 1322  Gross per 24 hour  Intake             3455 ml  Output             3000 ml  Net              455 ml   Filed Weights   12/03/16 1728 12/04/16 0056  Weight: 99.8 kg (220 lb) 101.2 kg (223 lb 3.2 oz)    Examination:  General exam: Appears calm and comfortable  Respiratory system: Clear to auscultation. Respiratory effort normal. Cardiovascular system: S1 & S2 heard, RRR. No JVD, murmurs, rubs, gallops or clicks. No pedal edema. Gastrointestinal system: Abdomen is nondistended, soft and significantly tender in the right upper and lower quadrants. No organomegaly or masses felt. Normal bowel sounds heard. Central nervous system: Alert and oriented. No focal neurological deficits. Extremities: Symmetric 5 x 5 power. Skin: No rashes, lesions or ulcers Psychiatry: Judgement and insight appear normal. Mood & affect appropriate.     Data Reviewed: I have personally reviewed following labs and imaging studies  CBC:  Recent Labs Lab 12/03/16 1601 12/03/16 1746 12/04/16 1124 12/05/16 0658 12/06/16 0429  WBC  --  6.9 4.7 4.7 4.0  NEUTROABS  --  4.1  --   --   --   HGB 7.3* 7.7* 9.3* 10.2* 9.7*  HCT  --  25.9* 29.6* 33.2* 31.1*  MCV  --  77.3* 80.0 81.0 80.2  PLT  --  317 262 264 254   Basic Metabolic Panel:  Recent Labs Lab 12/03/16 1746 12/04/16 1124 12/05/16 0658  NA 136 138 139  K 3.3* 3.4* 3.4*  CL 109 109 111  CO2 21* 24 23  GLUCOSE 92 98 100*  BUN 14 8 <5*  CREATININE 0.55 0.53 0.49  CALCIUM 8.5* 8.2* 8.2*   GFR: Estimated Creatinine Clearance: 124.7 mL/min (by C-G formula based on SCr of 0.49 mg/dL). Liver Function Tests:  Recent Labs Lab 12/03/16 1746 12/05/16 0658 12/06/16 0429 12/07/16 0756  AST 20 374* 455* 115*  ALT 23 164* 461* 276*  ALKPHOS 70 205* 246* 210*  BILITOT 0.4 0.5 0.1* 0.3  PROT 6.9 6.1* 6.1* 6.0*  ALBUMIN 3.7 3.3* 3.4* 3.2*    Recent  Labs Lab 12/03/16 1746  LIPASE 31   No results for input(s): AMMONIA in the last 168 hours. Coagulation Profile:  Recent Labs Lab 12/07/16 0756  INR 0.98   Cardiac Enzymes: No results for input(s): CKTOTAL, CKMB, CKMBINDEX, TROPONINI in the last 168 hours. BNP (last 3 results) No results for input(s): PROBNP in the last 8760 hours. HbA1C: No results for input(s): HGBA1C in the last 72 hours. CBG: No results for input(s): GLUCAP in the last 168 hours. Lipid Profile: No results for input(s): CHOL, HDL, LDLCALC, TRIG, CHOLHDL, LDLDIRECT in the last 72 hours. Thyroid Function Tests: No results for input(s): TSH, T4TOTAL,  FREET4, T3FREE, THYROIDAB in the last 72 hours. Anemia Panel: No results for input(s): VITAMINB12, FOLATE, FERRITIN, TIBC, IRON, RETICCTPCT in the last 72 hours. Sepsis Labs:  Recent Labs Lab 12/03/16 1747  LATICACIDVEN 0.9    Recent Results (from the past 240 hour(s))  Gastrointestinal Panel by PCR , Stool     Status: None   Collection Time: 12/04/16  2:01 AM  Result Value Ref Range Status   Campylobacter species NOT DETECTED NOT DETECTED Final   Plesimonas shigelloides NOT DETECTED NOT DETECTED Final   Salmonella species NOT DETECTED NOT DETECTED Final   Yersinia enterocolitica NOT DETECTED NOT DETECTED Final   Vibrio species NOT DETECTED NOT DETECTED Final   Vibrio cholerae NOT DETECTED NOT DETECTED Final   Enteroaggregative E coli (EAEC) NOT DETECTED NOT DETECTED Final   Enteropathogenic E coli (EPEC) NOT DETECTED NOT DETECTED Final   Enterotoxigenic E coli (ETEC) NOT DETECTED NOT DETECTED Final   Shiga like toxin producing E coli (STEC) NOT DETECTED NOT DETECTED Final   Shigella/Enteroinvasive E coli (EIEC) NOT DETECTED NOT DETECTED Final   Cryptosporidium NOT DETECTED NOT DETECTED Final   Cyclospora cayetanensis NOT DETECTED NOT DETECTED Final   Entamoeba histolytica NOT DETECTED NOT DETECTED Final   Giardia lamblia NOT DETECTED NOT DETECTED  Final   Adenovirus F40/41 NOT DETECTED NOT DETECTED Final   Astrovirus NOT DETECTED NOT DETECTED Final   Norovirus GI/GII NOT DETECTED NOT DETECTED Final   Rotavirus A NOT DETECTED NOT DETECTED Final   Sapovirus (I, II, IV, and V) NOT DETECTED NOT DETECTED Final         Radiology Studies: Dg Ugi W/high Density W/kub  Result Date: 12/07/2016 CLINICAL DATA:  Nausea vomiting. History of gastric bypass. Endoscopy showed possible gastric torsion. EXAM: UPPER GI SERIES WITH KUB TECHNIQUE: After obtaining a scout radiograph a routine upper GI series was performed using thin barium FLUOROSCOPY TIME:  Fluoroscopy Time:  1 minutes 42 second Radiation Exposure Index (if provided by the fluoroscopic device): Number of Acquired Spot Images: 0 COMPARISON:  CT abdomen 12/03/2016 FINDINGS: Preliminary KUB demonstrates gas in the right and transverse colon. Nonobstructive bowel gas pattern. Surgical clips related to gastric bypass surgery and cholecystectomy. The patient was very weak and was not able to stand for the study. The study was performed in the supine position while drinking through a straw. Patient was not able to drink the usual amount of barium. The esophageal mucosa and motility are normal. The gastric remnant has a normal location and morphology without edema. The gastric remnant emptied normally and rapidly into the jejunum. The small bowel is opacified and nondilated and in normal position. No ulcer or mass lesion identified. IMPRESSION: This study is limited by patient weakness and inability to drink usual amount of barium. Satisfactory postop gastric bypass surgery appearance. Electronically Signed   By: Marlan Palau M.D.   On: 12/07/2016 09:46        Scheduled Meds: . amphetamine-dextroamphetamine  30 mg Oral Daily  . carbamazepine  400 mg Oral QHS  . clonazePAM  0.5 mg Oral BID  . dicyclomine  20 mg Oral TID AC  . DULoxetine  60 mg Oral BID  . folic acid  1 mg Oral Daily  .  pantoprazole  40 mg Oral Daily  . simethicone  160 mg Oral QID   Continuous Infusions:    LOS: 3 days    Time spent:    Sricharan Lacomb, MD [facility] Pager [phone]  If 7PM-7AM,  please contact night-coverage [email or web address] Password [name] 12/07/2016, 4:26 PM

## 2016-12-07 NOTE — Anesthesia Postprocedure Evaluation (Signed)
Anesthesia Post Note  [name]  Procedure(s) Performed: Procedure(s) (LRB): ESOPHAGOGASTRODUODENOSCOPY (EGD) WITH PROPOFOL (N/A)  Patient location during evaluation: Nursing Unit Anesthesia Type: MAC Level of consciousness: awake and alert Pain management: pain level controlled Vital Signs Assessment: post-procedure vital signs reviewed and stable Respiratory status: spontaneous breathing Cardiovascular status: stable Anesthetic complications: no     Last Vitals:  Vitals:   12/06/16 2150 12/07/16 0531  BP: 113/76 106/61  Pulse: 78 65  Resp: 20 18  Temp: 36.6 C 36.4 C    Last Pain:  Vitals:   12/07/16 0920  TempSrc:   PainSc: 5                  Max Nuno

## 2016-12-07 NOTE — Addendum Note (Signed)
Addendum  created 12/07/16 0932 by Vista Deck, CRNA   Sign clinical note

## 2016-12-08 ENCOUNTER — Encounter (HOSPITAL_COMMUNITY): Payer: Self-pay | Admitting: Gastroenterology

## 2016-12-08 ENCOUNTER — Inpatient Hospital Stay (HOSPITAL_COMMUNITY): Payer: Medicaid Other

## 2016-12-08 LAB — CBC
HEMATOCRIT: 35 % — AB (ref 36.0–46.0)
HEMOGLOBIN: 10.9 g/dL — AB (ref 12.0–15.0)
MCH: 25.3 pg — AB (ref 26.0–34.0)
MCHC: 31.1 g/dL (ref 30.0–36.0)
MCV: 81.2 fL (ref 78.0–100.0)
Platelets: 303 10*3/uL (ref 150–400)
RBC: 4.31 MIL/uL (ref 3.87–5.11)
RDW: 17.9 % — ABNORMAL HIGH (ref 11.5–15.5)
WBC: 5.3 10*3/uL (ref 4.0–10.5)

## 2016-12-08 LAB — METHYLMALONIC ACID, SERUM: Methylmalonic Acid, Quantitative: 94 nmol/L (ref 0–378)

## 2016-12-08 LAB — HEPATIC FUNCTION PANEL
ALT: 225 U/L — AB (ref 14–54)
AST: 64 U/L — AB (ref 15–41)
Albumin: 3.6 g/dL (ref 3.5–5.0)
Alkaline Phosphatase: 212 U/L — ABNORMAL HIGH (ref 38–126)
Bilirubin, Direct: 0.1 mg/dL (ref 0.1–0.5)
Indirect Bilirubin: 0.3 mg/dL (ref 0.3–0.9)
Total Bilirubin: 0.4 mg/dL (ref 0.3–1.2)
Total Protein: 6.6 g/dL (ref 6.5–8.1)

## 2016-12-08 MED ORDER — CYANOCOBALAMIN 100 MCG PO TABS: 100.0000 ug | ORAL_TABLET | Freq: Every day | ORAL | Status: DC

## 2016-12-08 MED ORDER — PROCHLORPERAZINE EDISYLATE 5 MG/ML IJ SOLN: 10.0000 mg | Freq: Four times a day (QID) | INTRAMUSCULAR | Status: DC | PRN

## 2016-12-08 MED ORDER — PANTOPRAZOLE SODIUM 40 MG IV SOLR: 40.0000 mg | INTRAVENOUS | Status: DC

## 2016-12-08 MED ORDER — VITAMIN B-12 100 MCG PO TABS
100.0000 ug | ORAL_TABLET | Freq: Every day | ORAL | Status: DC
  Administered 2016-12-08: 100 ug via ORAL
  Filled 2016-12-08 (×3): qty 1

## 2016-12-08 MED ORDER — FOLIC ACID 1 MG PO TABS: 1.0000 mg | ORAL_TABLET | Freq: Every day | ORAL | Status: DC

## 2016-12-08 MED ORDER — SODIUM CHLORIDE 0.9 % IV SOLN
510.0000 mg | Freq: Once | INTRAVENOUS | Status: AC
  Administered 2016-12-08: 510 mg via INTRAVENOUS
  Filled 2016-12-08: qty 17

## 2016-12-08 MED ORDER — PROMETHAZINE HCL 25 MG/ML IJ SOLN: 12.5000 mg | Freq: Four times a day (QID) | INTRAMUSCULAR | 0 refills | Status: DC | PRN

## 2016-12-08 MED ORDER — HYDROCODONE-ACETAMINOPHEN 5-325 MG PO TABS
1.0000 | ORAL_TABLET | ORAL | Status: DC | PRN
  Administered 2016-12-08 (×3): 1 via ORAL
  Filled 2016-12-08 (×3): qty 1

## 2016-12-08 NOTE — Progress Notes (Signed)
  Subjective:  Patient states she is hungry. She heaved a few times earlier today and vomited small amount of bile. She is not having diarrhea anymore. He continues to complain of epigastric and right-sided abdominal pain.  Objective: Blood pressure 133/78, pulse 77, temperature 98.9 F (37.2 C), temperature source Oral, resp. rate 18, height 5\' 6"  (1.676 m), weight 223 lb 3.2 oz (101.2 kg), last menstrual period 11/22/2016, SpO2 100 %. Patient is alert and in no acute distress. Abdomen is full. Bowel sounds are normal. Abdomen is soft with mild to moderate midepigastric tenderness with some guarding. She has some guarding below the right costal margin. Tenderness in right low quadrant is unchanged. No organomegaly or masses. No LE edema or clubbing noted.  Labs/studies Results:   Recent Labs  12/06/16 0429 12/08/16 0454  WBC 4.0 5.3  HGB 9.7* 10.9*  HCT 31.1* 35.0*  PLT 254 303      LFT   Recent Labs  12/06/16 0429 12/07/16 0756 12/08/16 0454  PROT 6.1* 6.0* 6.6  ALBUMIN 3.4* 3.2* 3.6  AST 455* 115* 64*  ALT 461* 276* 225*  ALKPHOS 246* 210* 212*  BILITOT 0.1* 0.3 0.4  BILIDIR <0.1* <0.1* 0.1  IBILI NOT CALCULATED NOT CALCULATED 0.3    PT/INR   Recent Labs  12/07/16 0756  LABPROT 13.0  INR 0.98    Ultrasound reveals bile duct diameter of 9 mm. No evidence of choledocholithiasis.  Assessment:  #1. Elevated transaminases with fluctuating pattern. She is suspected to have microlithiasis or SOD dysfunction.  Dr. Oneida Alar was not able to reach ampulla of Vater at EGD 2 days ago.  I have discussed patient's condition with Dr. Saundra Shelling of [facility]. [facility] will be [facility] evaluation. #2. Nausea and vomiting. Nausea and vomiting is possibly due to biliary tract disease. Dr. Oneida Alar was concerned that she may have gastric torsion not noted on GI study done yesterday. Her upper GI tract can be reexamined at the time of ERCP while she is at  [facility]. #3. Acute diarrhea. GI pathogen panel was negative. Diarrhea has resolved. No further workup at this time. #4. Iron deficiency anemia. Iron deficiency anemia secondary to impaired iron absorption and GI blood loss due to NSAID use.Marland Kitchen   Recommendations:  Patient begun on full liquids. Feraheme 510 mg IV  Once. She will be transferred to [facility] when bed available.

## 2016-12-08 NOTE — Progress Notes (Signed)
PROGRESS NOTE    Penny [name]  ZOX:[id number] DOB: 1986/07/27 DOA: 12/03/2016 PCP: [name], MD    Brief Narrative:  31 year old female with history of gastric bypass, migraines, pseudoseizures, we'll refer to the emergency room due to low hemoglobin found at her primary care office. She's been feeling increasingly weak for 2-3 weeks prior to admission and has had persistent nausea, vomiting and diarrhea. She also had right-sided abdominal pain. Hemoglobin was found to be 7.3. Abdominal imaging indicated dilated extrahepatic ducts. Patient was admitted and transfused 2 units PRBC with improvement of hemoglobin. Abdominal imaging including CT, MRI, EGD and upper GI series have been unremarkable. Gastroenterology is following. She continues to have abdominal pain, and some nausea. Plan for PTC as per GI.    Assessment & Plan:   Principal Problem:   Symptomatic anemia Active Problems:   ADD (attention deficit disorder) without hyperactivity   Abdominal pain, acute, right lower quadrant   Pseudoseizures   Chronic migraine without aura   Folate deficiency   Nausea vomiting and diarrhea   Severe anemia   Chronic pain syndrome   1. Symptomatic anemia. Patient received 2 units of PRBCs on admission with improvement of hemoglobin. Anemia is related to iron deficiency which is likely a combination of chronic blood loss due to menstrual cycles as well as diminished absorption due to her GI surgeries. She's not been taking any supplemental iron. Iron and b12 supplementation added.   2. Diarrhea. GI pathogen panel negative. She has not had further diarrhea since admission making C diff unlikely. Will discontinue enteric precautions.  3. B-12 deficiency. Patient has not been taking any B-12 supplementation since her GI surgery. She will need lifelong replacement. B12 supplements added.   4. Dysphagia and history of esophageal dilatations. Seen by gastroenterology and underwent EGD with  results as below. She continues to complain of significant nausea and vomiting. This is worse after she eats or drink anything. I suspect she is being noncompliant with bariatric diet, having increased portion sizes that are leading her to vomit. Will request nutritional consult for diet education  5. Elevated liver enzymes. Patient is status post cholecystectomy. CT and MRI imaging of her abdomen indicated dilated hepatic ducts, no evidence of choledocholithiasis. 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 GI following. Hepatitis panel in process. Liver enzymes this morning are trending down. US abdomen shows liver steatosis. Gi planning for PTC.   6. Chronic pain syndrome. She follows with a pain clinic in [place]. She was told that she will not be prescribed any pain medications on discharge. ADDED her home medication today.   7. History of migraines. Continue on Topamax  8. History of pseudoseizures. On Tegretol.  9. Abdominal pain. Patient has significant right sided abdominal pain. Possibly related to IBS. I suspect some of her abdominal discomfort may be related to her recurrent vomiting. I have started the patient on dicyclomine . Plan for PTC today.    DVT prophylaxis: scds Code Status: Full code Family Communication: No family present Disposition Plan: Discharge home once improved   Consultants:   Gastroenterology  Procedures:  EGD: - Normal esophagus.                           - POSSIBLE TORSION OF GASTRIC REMNANT                            - Normal examined jejunum.  Antimicrobials:      Subjective: Patient complains of continued abdominal pain. Nausea, is better, toradol doesn't help and dicyclomine didn't help.   Objective: Vitals:   12/07/16 0531 12/07/16 1321 12/07/16 2145 12/08/16 0702  BP: 106/61 (!) 115/56 (!) 101/54 133/78  Pulse: 65 89 67 77  Resp: 18 20 20 18   Temp: 97.6 F (36.4 C) 97.8 F (36.6 C) 98 F (36.7 C) 98.9 F (37.2 C)  TempSrc: Oral  Oral Oral Oral  SpO2: 100% 100% 97% 100%  Weight:      Height:        Intake/Output Summary (Last 24 hours) at 12/08/16 1648 Last data filed at 12/08/16 0900  Gross per 24 hour  Intake              110 ml  Output              600 ml  Net             -490 ml   Filed Weights   12/03/16 1728 12/04/16 0056  Weight: 99.8 kg (220 lb) 101.2 kg (223 lb 3.2 oz)    Examination:  General exam: Appears calm and comfortable  Respiratory system: Clear to auscultation. Respiratory effort normal. Cardiovascular system: S1 & S2 heard, RRR. No JVD, murmurs, rubs, gallops or clicks. No pedal edema. Gastrointestinal system: Abdomen is nondistended, soft and significantly tender in the right upper and lower quadrants. No organomegaly or masses felt. Normal bowel sounds heard. Central nervous system: Alert and oriented. No focal neurological deficits. Extremities: Symmetric 5 x 5 power. Skin: No rashes, lesions or ulcers Psychiatry: Judgement and insight appear normal. Mood & affect appropriate.     Data Reviewed: I have personally reviewed following labs and imaging studies  CBC:  Recent Labs Lab 12/03/16 1746 12/04/16 1124 12/05/16 0658 12/06/16 0429 12/08/16 0454  WBC 6.9 4.7 4.7 4.0 5.3  NEUTROABS 4.1  --   --   --   --   HGB 7.7* 9.3* 10.2* 9.7* 10.9*  HCT 25.9* 29.6* 33.2* 31.1* 35.0*  MCV 77.3* 80.0 81.0 80.2 81.2  PLT 317 262 264 254 303   Basic Metabolic Panel:  Recent Labs Lab 12/03/16 1746 12/04/16 1124 12/05/16 0658  NA 136 138 139  K 3.3* 3.4* 3.4*  CL 109 109 111  CO2 21* 24 23  GLUCOSE 92 98 100*  BUN 14 8 <5*  CREATININE 0.55 0.53 0.49  CALCIUM 8.5* 8.2* 8.2*   GFR: Estimated Creatinine Clearance: 124.7 mL/min (by C-G formula based on SCr of 0.49 mg/dL). Liver Function Tests:  Recent Labs Lab 12/03/16 1746 12/05/16 0658 12/06/16 0429 12/07/16 0756 12/08/16 0454  AST 20 374* 455* 115* 64*  ALT 23 164* 461* 276* 225*  ALKPHOS 70 205* 246* 210*  212*  BILITOT 0.4 0.5 0.1* 0.3 0.4  PROT 6.9 6.1* 6.1* 6.0* 6.6  ALBUMIN 3.7 3.3* 3.4* 3.2* 3.6    Recent Labs Lab 12/03/16 1746  LIPASE 31   No results for input(s): AMMONIA in the last 168 hours. Coagulation Profile:  Recent Labs Lab 12/07/16 0756  INR 0.98   Cardiac Enzymes: No results for input(s): CKTOTAL, CKMB, CKMBINDEX, TROPONINI in the last 168 hours. BNP (last 3 results) No results for input(s): PROBNP in the last 8760 hours. HbA1C: No results for input(s): HGBA1C in the last 72 hours. CBG: No results for input(s): GLUCAP in the last 168 hours. Lipid Profile: No results for input(s): CHOL, HDL, LDLCALC, TRIG, CHOLHDL, LDLDIRECT  in the last 72 hours. Thyroid Function Tests: No results for input(s): TSH, T4TOTAL, FREET4, T3FREE, THYROIDAB in the last 72 hours. Anemia Panel: No results for input(s): VITAMINB12, FOLATE, FERRITIN, TIBC, IRON, RETICCTPCT in the last 72 hours. Sepsis Labs:  Recent Labs Lab 12/03/16 1747  LATICACIDVEN 0.9    Recent Results (from the past 240 hour(s))  Gastrointestinal Panel by PCR , Stool     Status: None   Collection Time: 12/04/16  2:01 AM  Result Value Ref Range Status   Campylobacter species NOT DETECTED NOT DETECTED Final   Plesimonas shigelloides NOT DETECTED NOT DETECTED Final   Salmonella species NOT DETECTED NOT DETECTED Final   Yersinia enterocolitica NOT DETECTED NOT DETECTED Final   Vibrio species NOT DETECTED NOT DETECTED Final   Vibrio cholerae NOT DETECTED NOT DETECTED Final   Enteroaggregative E coli (EAEC) NOT DETECTED NOT DETECTED Final   Enteropathogenic E coli (EPEC) NOT DETECTED NOT DETECTED Final   Enterotoxigenic E coli (ETEC) NOT DETECTED NOT DETECTED Final   Shiga like toxin producing E coli (STEC) NOT DETECTED NOT DETECTED Final   Shigella/Enteroinvasive E coli (EIEC) NOT DETECTED NOT DETECTED Final   Cryptosporidium NOT DETECTED NOT DETECTED Final   Cyclospora cayetanensis NOT DETECTED NOT DETECTED  Final   Entamoeba histolytica NOT DETECTED NOT DETECTED Final   Giardia lamblia NOT DETECTED NOT DETECTED Final   Adenovirus F40/41 NOT DETECTED NOT DETECTED Final   Astrovirus NOT DETECTED NOT DETECTED Final   Norovirus GI/GII NOT DETECTED NOT DETECTED Final   Rotavirus A NOT DETECTED NOT DETECTED Final   Sapovirus (I, II, IV, and V) NOT DETECTED NOT DETECTED Final         Radiology Studies: Dg Ugi W/high Density W/kub  Result Date: 12/07/2016 CLINICAL DATA:  Nausea vomiting. History of gastric bypass. Endoscopy showed possible gastric torsion. EXAM: UPPER GI SERIES WITH KUB TECHNIQUE: After obtaining a scout radiograph a routine upper GI series was performed using thin barium FLUOROSCOPY TIME:  Fluoroscopy Time:  1 minutes 42 second Radiation Exposure Index (if provided by the fluoroscopic device): Number of Acquired Spot Images: 0 COMPARISON:  CT abdomen 12/03/2016 FINDINGS: Preliminary KUB demonstrates gas in the right and transverse colon. Nonobstructive bowel gas pattern. Surgical clips related to gastric bypass surgery and cholecystectomy. The patient was very weak and was not able to stand for the study. The study was performed in the supine position while drinking through a straw. Patient was not able to drink the usual amount of barium. The esophageal mucosa and motility are normal. The gastric remnant has a normal location and morphology without edema. The gastric remnant emptied normally and rapidly into the jejunum. The small bowel is opacified and nondilated and in normal position. No ulcer or mass lesion identified. IMPRESSION: This study is limited by patient weakness and inability to drink usual amount of barium. Satisfactory postop gastric bypass surgery appearance. Electronically Signed   By: Marlan Palau M.D.   On: 12/07/2016 09:46   US Abdomen Limited Ruq  Result Date: 12/08/2016 CLINICAL DATA:  Elevated LFTs. EXAM: US ABDOMEN LIMITED - RIGHT UPPER QUADRANT COMPARISON:   None. FINDINGS: Gallbladder: Prior cholecystectomy. Common bile duct: Diameter: 9.4 mm.  Compatible with post cholecystectomy state. Liver: No focal lesion identified. Increased hepatic parenchymal echogenicity. No intrahepatic biliary ductal dilatation. IMPRESSION: 1. Increased hepatic echogenicity as can be seen with hepatic steatosis. 2. Prior cholecystectomy. Electronically Signed   By: Elige Ko   On: 12/08/2016 08:31  Scheduled Meds: . amphetamine-dextroamphetamine  30 mg Oral Daily  . carbamazepine  400 mg Oral QHS  . clonazePAM  0.5 mg Oral BID  . dicyclomine  20 mg Oral TID AC  . DULoxetine  60 mg Oral BID  . famotidine (PEPCID) IV  20 mg Intravenous Q12H  . folic acid  1 mg Oral Daily  . pantoprazole (PROTONIX) IV  40 mg Intravenous Q24H  . simethicone  160 mg Oral QID   Continuous Infusions:    LOS: 4 days    Time spent:    Areeb Corron, MD [facility] Pager [phone]  If 7PM-7AM, please contact night-coverage [email or web address] Password [facility] 12/08/2016, 4:48 PM

## 2016-12-08 NOTE — Progress Notes (Signed)
Pt transported to [facility] via [facility] at this time. Report called previously to [facility] [place] at [facility].

## 2016-12-08 NOTE — Plan of Care (Signed)
Problem: Food- and Nutrition-Related Knowledge Deficit (NB-1.1) Goal: Nutrition education Formal process to instruct or train a patient/client in a skill or to impart knowledge to help patients/clients voluntarily manage or modify food choices and eating behavior to maintain or improve health. Outcome: Adequate for Discharge Nutrition Education Note  RD consulted for nutrition education regarding a Bariatric diet. She has gastric surgery back in 2007. She defers discussion daily intake but does admit to non-compliance with daily vitamin regimen. Her mother is here and affirms she has already purchased the vitamins for her.  She consumes a regular diet with no restrictions.  Emphasized the importance of serving sizes. Agree with MD she is likely overeating at times based on her current weight, age and limited activity level.  Expect fair compliance.  Filed Weights   12/03/16 1728 12/04/16 0056  Weight: 220 lb (99.8 kg) 223 lb 3.2 oz (101.2 kg)    Body mass index is 36.03 kg/m. Pt meets criteria for obesity class II  based on current BMI. Her weight hx has ranged from 202-230# over the past 1.5 years.  Current diet order is NPO.  Recent Labs Lab 12/03/16 1746 12/04/16 1124 12/05/16 0658  NA 136 138 139  K 3.3* 3.4* 3.4*  CL 109 109 111  CO2 21* 24 23  BUN 14 8 <5*  CREATININE 0.55 0.53 0.49  CALCIUM 8.5* 8.2* 8.2*  GLUCOSE 92 98 100*     Labs and medications reviewed. No further nutrition interventions warranted at this time. RD contact information provided.  If patient begins to desire weight loss and lifestyle changes; recommend she follow up as OP with [facility].  Colman Cater MS,RD,CSG,LDN Office: [phone] Pager: [phone]

## 2016-12-08 NOTE — Discharge Summary (Signed)
Physician Discharge Summary  Penny [name] [id number] DOB: Jan 11, 1986 DOA: 12/03/2016  PCP: [name], MD  Admit date: 12/03/2016 Discharge date: 12/08/2016  Admitted From:Home.  Disposition:  [facility].  Recommendations for Outpatient Follow-up:  1. Follow up with PCP in 1-2 weeks Please follow up with Gi at [facility]  Discharge Condition:stable.  Full code.   Brief/Interim Summary: 31 year old female with history of gastric bypass, migraines, pseudoseizures, we'll refer to the emergency room due to low hemoglobin found at her primary care office. She's been feeling increasingly weak for 2-3 weeks prior to admission and has had persistent nausea, vomiting and diarrhea. She also had right-sided abdominal pain. Hemoglobin was found to be 7.3. Abdominal imaging indicated dilated extrahepatic ducts. Patient was admitted and transfused 2 units PRBC with improvement of hemoglobin. Abdominal imaging including CT, MRI, EGD and upper GI series have been unremarkable. Gastroenterology is following. She continues to have abdominal pain, and some nausea. She will need ERCP as per GI, requested transfer [facility] ER[facility].   Discharge Diagnoses:  Principal Problem:   Symptomatic anemia Active Problems:   ADD (attention deficit disorder) without hyperactivity   Abdominal pain, acute, right lower quadrant   Pseudoseizures   Chronic migraine without aura   Folate deficiency   Nausea vomiting and diarrhea   Severe anemia   Chronic pain syndrome     1. Symptomatic anemia. Patient received 2 units of PRBCs on admission with improvement of hemoglobin. Anemia is related to iron deficiency which is likely a combination of chronic blood loss due to menstrual cycles as well as diminished absorption due to her GI surgeries. She's not been taking any supplemental iron. Iron and b12 supplementation added.   2. Diarrhea. GI pathogen panel negative. She has not had further diarrhea  since admission making C diff unlikely.   3. B-12 deficiency. Patient has not been taking any B-12 supplementation since her GI surgery. She will need lifelong replacement. B12 supplements added.   4. Dysphagia and history of esophageal dilatations. Seen by gastroenterology and underwent EGD with results as below. She continues to complain of significant nausea and vomiting. This is worse after she eats or drink anything. Will request nutritional consult for diet education  5. Elevated liver enzymes. Patient is status post cholecystectomy. CT and MRI imaging of her abdomen indicated dilated hepatic ducts, no evidence of choledocholithiasis. 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 GI following. Hepatitis panel in process. Liver enzymes this morning are trending down. US abdomen shows liver steatosis. Gi recommending transfer to [facility] for further evaluation like ERCP.   6. Chronic pain syndrome. She follows with a pain clinic in [place]. Resume home medications.   7. History of migraines. Continue on Topamax  8. History of pseudoseizures. On Tegretol.  9. Abdominal pain. Patient has significant right sided abdominal pain. Possibly related to IBS. I suspect some of her abdominal discomfort may be related to her recurrent vomiting. I have started the patient on dicyclomine .   Discharge Instructions   Allergies as of 12/08/2016      Reactions   Gabapentin Other (See Comments)   Pt states that she was unresponsive after taking this medication, but her vitals remained stable.     Metoclopramide Hcl Anxiety, Other (See Comments)   Pt states that she felt like she was trapped in a box, and could not get out.  Pt also states that she had temporary loss of movement, weakness, and tingling.     Tramadol Other (  See Comments)   Seizures   Morphine And Related Other (See Comments)   Chest pain   Nucynta [tapentadol]    vomiting   Propofol Nausea And Vomiting   Trazodone And Nefazodone  Other (See Comments)   Makes pt "like a zombie"   Zofran [ondansetron] Other (See Comments)   Migraines   Butrans [buprenorphine]    Patch caused rash, didn't help with pain   Latex Rash   Lyrica [pregabalin]    suicidal   Tape Itching, Rash   Please use paper tape      Medication List    STOP taking these medications   MENSTRUAL PAIN RELIEF 500-25-15 MG Tabs Generic drug:  APAP-Pamabrom-Pyrilamine   pantoprazole 40 MG tablet Commonly known as:  PROTONIX Replaced by:  pantoprazole 40 MG injection     TAKE these medications   albuterol 108 (90 Base) MCG/ACT inhaler Commonly known as:  PROVENTIL HFA;VENTOLIN HFA Inhale 2 puffs into the lungs every 6 (six) hours as needed for wheezing.   amphetamine-dextroamphetamine 30 MG 24 hr capsule Commonly known as:  ADDERALL XR Take 1 capsule (30 mg total) by mouth daily.   amphetamine-dextroamphetamine 30 MG 24 hr capsule Commonly known as:  ADDERALL XR Take 1 capsule (30 mg total) by mouth daily.   carbamazepine 200 MG tablet Commonly known as:  TEGRETOL Take two tablets at bedtime What changed:  how much to take  how to take this  when to take this  additional instructions   clonazePAM 0.5 MG tablet Commonly known as:  KLONOPIN Take 1 tablet (0.5 mg total) by mouth 2 (two) times daily.   cyanocobalamin 100 MCG tablet Take 1 tablet (100 mcg total) by mouth daily.   DULoxetine 60 MG capsule Commonly known as:  CYMBALTA Take 1 capsule (60 mg total) by mouth 2 (two) times daily.   folic acid 1 MG tablet Commonly known as:  FOLVITE Take 1 tablet (1 mg total) by mouth daily. Start taking on:  12/09/2016   HYDROcodone-acetaminophen 10-325 MG tablet Commonly known as:  NORCO Take 1 tablet by mouth every 6 (six) hours as needed.   pantoprazole 40 MG injection Commonly known as:  PROTONIX Inject 40 mg into the vein daily. Start taking on:  12/09/2016 Replaces:  pantoprazole 40 MG tablet   prochlorperazine 5 MG/ML  injection Commonly known as:  COMPAZINE Inject 2 mLs (10 mg total) into the vein every 6 (six) hours as needed.   promethazine 25 MG/ML injection Commonly known as:  PHENERGAN Inject 0.5 mLs (12.5 mg total) into the vein every 6 (six) hours as needed for nausea or vomiting.   tiZANidine 2 MG tablet Commonly known as:  ZANAFLEX Take 1 tablet by mouth TID  prn What changed:  how much to take  how to take this  when to take this  reasons to take this  additional instructions   topiramate 50 MG tablet Commonly known as:  TOPAMAX TAKE 1 TABLET BY MOUTH EACH MORNING AND 2 TABLETS IN THE EVENING AS NEEDED FOR MIGRAINES. What changed:  how much to take  how to take this  when to take this  additional instructions       Allergies  Allergen Reactions  . Gabapentin Other (See Comments)    Pt states that she was unresponsive after taking this medication, but her vitals remained stable.    . Metoclopramide Hcl Anxiety and Other (See Comments)    Pt states that she felt like she was trapped  in a box, and could not get out.  Pt also states that she had temporary loss of movement, weakness, and tingling.    . Tramadol Other (See Comments)    Seizures  . Morphine And Related Other (See Comments)    Chest pain   . Nucynta [Tapentadol]     vomiting  . Propofol Nausea And Vomiting  . Trazodone And Nefazodone Other (See Comments)    Makes pt "like a zombie"  . Zofran [Ondansetron] Other (See Comments)    Migraines   . Butrans [Buprenorphine]     Patch caused rash, didn't help with pain  . Latex Rash  . Lyrica [Pregabalin]     suicidal  . Tape Itching and Rash    Please use paper tape    Consultations:  GI    Procedures/Studies: Ct Abdomen Pelvis W Contrast  Result Date: 12/03/2016 CLINICAL DATA:  Oral contrast per protocol; RLQ pain; Pt sent from Dr Lance Sell office, pale ,weak, Hemoglobin 7.3, vomiting x 1 weeks, diarrhea, vaginal bleeding x 2 weeks^144mL  ISOVUE-300 IOPAMIDOL (ISOVUE-300) INJECTION 61% EXAM: CT ABDOMEN AND PELVIS WITH CONTRAST TECHNIQUE: Multidetector CT imaging of the abdomen and pelvis was performed using the standard protocol following bolus administration of intravenous contrast. CONTRAST:  123mL ISOVUE-300 IOPAMIDOL (ISOVUE-300) INJECTION 61% COMPARISON:  06/15/2016 FINDINGS: Lower chest: No pulmonary nodules, pleural effusions, or infiltrates. Heart size is normal. No imaged pericardial effusion or significant coronary artery calcifications. Hepatobiliary: Status post cholecystectomy. Mild intrahepatic biliary duct dilatation. Common bile duct is prominent, measuring 1.3 cm. Dilatation is increased since prior study on 04/07/2015. Pancreas: Unremarkable. No pancreatic ductal dilatation or surrounding inflammatory changes. Spleen: Normal in size without focal abnormality. Adrenals/Urinary Tract: Adrenal glands are unremarkable. Kidneys are normal, without renal calculi, focal lesion, or hydronephrosis. Bladder is unremarkable. Stomach/Bowel: Status post gastric bypass surgery, unremarkable in appearance. Small bowel loops are unremarkable in appearance. The appendix is well seen and has a normal appearance. Early contrast filling of the large bowel limits evaluation of the colon. However, no focal abnormality identified. Vascular/Lymphatic: No significant vascular findings are present. Numerous small, nonspecific mesenteric lymph nodes are present. Reproductive: The uterus is present.  No adnexal mass. Other: No abdominal wall hernia or abnormality. No abdominopelvic ascites. Musculoskeletal: No acute or significant osseous findings. IMPRESSION: 1. Status post cholecystectomy. 2. Mild intra and extrahepatic biliary duct dilatation may be explained by the patient's postoperative state but is more prominent than typical and has increased. Correlation with hepatic enzymes recommended. Consider MRCP for further evaluation. 3. Uncomplicated  appearance of gastric bypass surgery. 4. Small nonspecific mesenteric lymph nodes. Electronically Signed   By: Nolon Nations M.D.   On: 12/03/2016 21:57   Mr Abdomen Mrcp Wo Contrast  Result Date: 12/04/2016 CLINICAL DATA:  Dilated common bile duct. Patient status post cholecystectomy. EXAM: MRI ABDOMEN WITHOUT CONTRAST  (INCLUDING MRCP) TECHNIQUE: Multiplanar multisequence MR imaging of the abdomen was performed. Heavily T2-weighted images of the biliary and pancreatic ducts were obtained, and three-dimensional MRCP images were rendered by post processing. COMPARISON:  CT 12/03/2016 FINDINGS: Lower chest:  Lung bases are clear. Hepatobiliary: Mild intrahepatic duct dilatation. The common bile duct measures 7 mm in diameter. There is a low insertion of the cystic duct. No filling defect within the common bile duct. No external compression. There is no focal hepatic lesion. No hepatic steatosis. Patient status post cholecystectomy. Pancreas: Splenic parenchyma appears normal. There is no variant ductal anatomy identified. No duct dilatation or inflammation. Spleen: Normal spleen.  Adrenals/urinary tract: Adrenal glands and kidneys are normal. Stomach/Bowel: Stomach and limited of the small bowel is unremarkable Vascular/Lymphatic: Abdominal aortic normal caliber. No retroperitoneal periportal lymphadenopathy. Musculoskeletal: No aggressive osseous lesion IMPRESSION: 1. Mild intrahepatic and extrahepatic duct dilatation. Favor sequelae of cholecystectomy. 2. Low insertion of the cystic duct remnant. 3. Normal pancreas without evidence ductal variation or inflammation. 4. Normal hepatic parenchymal. Electronically Signed   By: Suzy Bouchard M.D.   On: 12/04/2016 11:16   Mr 3d Recon At Scanner  Result Date: 12/04/2016 CLINICAL DATA:  Dilated common bile duct. Patient status post cholecystectomy. EXAM: MRI ABDOMEN WITHOUT CONTRAST  (INCLUDING MRCP) TECHNIQUE: Multiplanar multisequence MR imaging of the  abdomen was performed. Heavily T2-weighted images of the biliary and pancreatic ducts were obtained, and three-dimensional MRCP images were rendered by post processing. COMPARISON:  CT 12/03/2016 FINDINGS: Lower chest:  Lung bases are clear. Hepatobiliary: Mild intrahepatic duct dilatation. The common bile duct measures 7 mm in diameter. There is a low insertion of the cystic duct. No filling defect within the common bile duct. No external compression. There is no focal hepatic lesion. No hepatic steatosis. Patient status post cholecystectomy. Pancreas: Splenic parenchyma appears normal. There is no variant ductal anatomy identified. No duct dilatation or inflammation. Spleen: Normal spleen. Adrenals/urinary tract: Adrenal glands and kidneys are normal. Stomach/Bowel: Stomach and limited of the small bowel is unremarkable Vascular/Lymphatic: Abdominal aortic normal caliber. No retroperitoneal periportal lymphadenopathy. Musculoskeletal: No aggressive osseous lesion IMPRESSION: 1. Mild intrahepatic and extrahepatic duct dilatation. Favor sequelae of cholecystectomy. 2. Low insertion of the cystic duct remnant. 3. Normal pancreas without evidence ductal variation or inflammation. 4. Normal hepatic parenchymal. Electronically Signed   By: Suzy Bouchard M.D.   On: 12/04/2016 11:16   Dg Ugi W/high Density W/kub  Result Date: 12/07/2016 CLINICAL DATA:  Nausea vomiting. History of gastric bypass. Endoscopy showed possible gastric torsion. EXAM: UPPER GI SERIES WITH KUB TECHNIQUE: After obtaining a scout radiograph a routine upper GI series was performed using thin barium FLUOROSCOPY TIME:  Fluoroscopy Time:  1 minutes 42 second Radiation Exposure Index (if provided by the fluoroscopic device): Number of Acquired Spot Images: 0 COMPARISON:  CT abdomen 12/03/2016 FINDINGS: Preliminary KUB demonstrates gas in the right and transverse colon. Nonobstructive bowel gas pattern. Surgical clips related to gastric bypass  surgery and cholecystectomy. The patient was very weak and was not able to stand for the study. The study was performed in the supine position while drinking through a straw. Patient was not able to drink the usual amount of barium. The esophageal mucosa and motility are normal. The gastric remnant has a normal location and morphology without edema. The gastric remnant emptied normally and rapidly into the jejunum. The small bowel is opacified and nondilated and in normal position. No ulcer or mass lesion identified. IMPRESSION: This study is limited by patient weakness and inability to drink usual amount of barium. Satisfactory postop gastric bypass surgery appearance. Electronically Signed   By: Franchot Gallo M.D.   On: 12/07/2016 09:46   US Abdomen Limited Ruq  Result Date: 12/08/2016 CLINICAL DATA:  Elevated LFTs. EXAM: US ABDOMEN LIMITED - RIGHT UPPER QUADRANT COMPARISON:  None. FINDINGS: Gallbladder: Prior cholecystectomy. Common bile duct: Diameter: 9.4 mm.  Compatible with post cholecystectomy state. Liver: No focal lesion identified. Increased hepatic parenchymal echogenicity. No intrahepatic biliary ductal dilatation. IMPRESSION: 1. Increased hepatic echogenicity as can be seen with hepatic steatosis. 2. Prior cholecystectomy. Electronically Signed   By: Kathreen Devoid  On: 12/08/2016 08:31      Subjective: persistant abdominal pain, requesting to increase her pain medications.   Discharge Exam: Vitals:   12/07/16 2145 12/08/16 0702  BP: (!) 101/54 133/78  Pulse: 67 77  Resp: 20 18  Temp: 98 F (36.7 C) 98.9 F (37.2 C)   Vitals:   12/07/16 0531 12/07/16 1321 12/07/16 2145 12/08/16 0702  BP: 106/61 (!) 115/56 (!) 101/54 133/78  Pulse: 65 89 67 77  Resp: 18 20 20 18   Temp: 97.6 F (36.4 C) 97.8 F (36.6 C) 98 F (36.7 C) 98.9 F (37.2 C)  TempSrc: Oral Oral Oral Oral  SpO2: 100% 100% 97% 100%  Weight:      Height:        General: Pt is alert, awake, not in acute  distress Cardiovascular: RRR, S1/S2 +, no rubs, no gallops Respiratory: CTA bilaterally, no wheezing, no rhonchi Abdominal: Soft, NT, ND, bowel sounds + Extremities: no edema, no cyanosis    The results of significant diagnostics from this hospitalization (including imaging, microbiology, ancillary and laboratory) are listed below for reference.     Microbiology: Recent Results (from the past 240 hour(s))  Gastrointestinal Panel by PCR , Stool     Status: None   Collection Time: 12/04/16  2:01 AM  Result Value Ref Range Status   Campylobacter species NOT DETECTED NOT DETECTED Final   Plesimonas shigelloides NOT DETECTED NOT DETECTED Final   Salmonella species NOT DETECTED NOT DETECTED Final   Yersinia enterocolitica NOT DETECTED NOT DETECTED Final   Vibrio species NOT DETECTED NOT DETECTED Final   Vibrio cholerae NOT DETECTED NOT DETECTED Final   Enteroaggregative E coli (EAEC) NOT DETECTED NOT DETECTED Final   Enteropathogenic E coli (EPEC) NOT DETECTED NOT DETECTED Final   Enterotoxigenic E coli (ETEC) NOT DETECTED NOT DETECTED Final   Shiga like toxin producing E coli (STEC) NOT DETECTED NOT DETECTED Final   Shigella/Enteroinvasive E coli (EIEC) NOT DETECTED NOT DETECTED Final   Cryptosporidium NOT DETECTED NOT DETECTED Final   Cyclospora cayetanensis NOT DETECTED NOT DETECTED Final   Entamoeba histolytica NOT DETECTED NOT DETECTED Final   Giardia lamblia NOT DETECTED NOT DETECTED Final   Adenovirus F40/41 NOT DETECTED NOT DETECTED Final   Astrovirus NOT DETECTED NOT DETECTED Final   Norovirus GI/GII NOT DETECTED NOT DETECTED Final   Rotavirus A NOT DETECTED NOT DETECTED Final   Sapovirus (I, II, IV, and V) NOT DETECTED NOT DETECTED Final     Labs: BNP (last 3 results) No results for input(s): BNP in the last 8760 hours. Basic Metabolic Panel:  Recent Labs Lab 12/03/16 1746 12/04/16 1124 12/05/16 0658  NA 136 138 139  K 3.3* 3.4* 3.4*  CL 109 109 111  CO2 21* 24  23  GLUCOSE 92 98 100*  BUN 14 8 <5*  CREATININE 0.55 0.53 0.49  CALCIUM 8.5* 8.2* 8.2*   Liver Function Tests:  Recent Labs Lab 12/03/16 1746 12/05/16 0658 12/06/16 0429 12/07/16 0756 12/08/16 0454  AST 20 374* 455* 115* 64*  ALT 23 164* 461* 276* 225*  ALKPHOS 70 205* 246* 210* 212*  BILITOT 0.4 0.5 0.1* 0.3 0.4  PROT 6.9 6.1* 6.1* 6.0* 6.6  ALBUMIN 3.7 3.3* 3.4* 3.2* 3.6    Recent Labs Lab 12/03/16 1746  LIPASE 31   No results for input(s): AMMONIA in the last 168 hours. CBC:  Recent Labs Lab 12/03/16 1746 12/04/16 1124 12/05/16 0658 12/06/16 0429 12/08/16 0454  WBC 6.9 4.7 4.7  4.0 5.3  NEUTROABS 4.1  --   --   --   --   HGB 7.7* 9.3* 10.2* 9.7* 10.9*  HCT 25.9* 29.6* 33.2* 31.1* 35.0*  MCV 77.3* 80.0 81.0 80.2 81.2  PLT 317 262 264 254 303   Cardiac Enzymes: No results for input(s): CKTOTAL, CKMB, CKMBINDEX, TROPONINI in the last 168 hours. BNP: Invalid input(s): POCBNP CBG: No results for input(s): GLUCAP in the last 168 hours. D-Dimer No results for input(s): DDIMER in the last 72 hours. Hgb A1c No results for input(s): HGBA1C in the last 72 hours. Lipid Profile No results for input(s): CHOL, HDL, LDLCALC, TRIG, CHOLHDL, LDLDIRECT in the last 72 hours. Thyroid function studies No results for input(s): TSH, T4TOTAL, T3FREE, THYROIDAB in the last 72 hours.  Invalid input(s): FREET3 Anemia work up No results for input(s): VITAMINB12, FOLATE, FERRITIN, TIBC, IRON, RETICCTPCT in the last 72 hours. Urinalysis    Component Value Date/Time   COLORURINE STRAW (A) 12/03/2016 1908   APPEARANCEUR CLEAR 12/03/2016 1908   LABSPEC 1.008 12/03/2016 1908   PHURINE 5.0 12/03/2016 1908   GLUCOSEU NEGATIVE 12/03/2016 1908   HGBUR NEGATIVE 12/03/2016 1908   BILIRUBINUR NEGATIVE 12/03/2016 1908   KETONESUR NEGATIVE 12/03/2016 1908   PROTEINUR NEGATIVE 12/03/2016 1908   UROBILINOGEN 4.0 (H) 08/17/2015 1300   NITRITE NEGATIVE 12/03/2016 1908   LEUKOCYTESUR  NEGATIVE 12/03/2016 1908   Sepsis Labs Invalid input(s): PROCALCITONIN,  WBC,  LACTICIDVEN Microbiology Recent Results (from the past 240 hour(s))  Gastrointestinal Panel by PCR , Stool     Status: None   Collection Time: 12/04/16  2:01 AM  Result Value Ref Range Status   Campylobacter species NOT DETECTED NOT DETECTED Final   Plesimonas shigelloides NOT DETECTED NOT DETECTED Final   Salmonella species NOT DETECTED NOT DETECTED Final   Yersinia enterocolitica NOT DETECTED NOT DETECTED Final   Vibrio species NOT DETECTED NOT DETECTED Final   Vibrio cholerae NOT DETECTED NOT DETECTED Final   Enteroaggregative E coli (EAEC) NOT DETECTED NOT DETECTED Final   Enteropathogenic E coli (EPEC) NOT DETECTED NOT DETECTED Final   Enterotoxigenic E coli (ETEC) NOT DETECTED NOT DETECTED Final   Shiga like toxin producing E coli (STEC) NOT DETECTED NOT DETECTED Final   Shigella/Enteroinvasive E coli (EIEC) NOT DETECTED NOT DETECTED Final   Cryptosporidium NOT DETECTED NOT DETECTED Final   Cyclospora cayetanensis NOT DETECTED NOT DETECTED Final   Entamoeba histolytica NOT DETECTED NOT DETECTED Final   Giardia lamblia NOT DETECTED NOT DETECTED Final   Adenovirus F40/41 NOT DETECTED NOT DETECTED Final   Astrovirus NOT DETECTED NOT DETECTED Final   Norovirus GI/GII NOT DETECTED NOT DETECTED Final   Rotavirus A NOT DETECTED NOT DETECTED Final   Sapovirus (I, II, IV, and V) NOT DETECTED NOT DETECTED Final     Time coordinating discharge: Over 30 minutes  SIGNED:   Hosie Poisson, MD  [facility] 12/08/2016, 6:57 PM Pager   If 7PM-7AM, please contact night-coverage [email or web address] Password [email or web address]

## 2016-12-09 MED FILL — Hydromorphone HCl Inj 2 MG/ML: INTRAMUSCULAR | Qty: 1 | Status: AC

## 2016-12-17 ENCOUNTER — Encounter (HOSPITAL_COMMUNITY): Payer: Self-pay | Admitting: Emergency Medicine

## 2016-12-17 ENCOUNTER — Emergency Department (HOSPITAL_COMMUNITY): Payer: Medicaid Other

## 2016-12-17 ENCOUNTER — Encounter (HOSPITAL_COMMUNITY): Payer: Self-pay | Admitting: Psychiatry

## 2016-12-17 ENCOUNTER — Telehealth (INDEPENDENT_AMBULATORY_CARE_PROVIDER_SITE_OTHER): Payer: Self-pay | Admitting: Internal Medicine

## 2016-12-17 ENCOUNTER — Emergency Department (HOSPITAL_COMMUNITY)
Admission: EM | Admit: 2016-12-17 | Discharge: 2016-12-17 | Disposition: A | Discharge status: Home / Self Care | Payer: Medicaid Other | Attending: Emergency Medicine | Admitting: Emergency Medicine

## 2016-12-17 ENCOUNTER — Ambulatory Visit (INDEPENDENT_AMBULATORY_CARE_PROVIDER_SITE_OTHER): Payer: Medicaid Other | Admitting: Psychiatry

## 2016-12-17 VITALS — Ht 66.0 in

## 2016-12-17 DIAGNOSIS — Z9889 Other specified postprocedural states: Secondary | ICD-10-CM

## 2016-12-17 DIAGNOSIS — F332 Major depressive disorder, recurrent severe without psychotic features: Secondary | ICD-10-CM | POA: Diagnosis not present

## 2016-12-17 DIAGNOSIS — Z803 Family history of malignant neoplasm of breast: Secondary | ICD-10-CM

## 2016-12-17 DIAGNOSIS — F17210 Nicotine dependence, cigarettes, uncomplicated: Secondary | ICD-10-CM

## 2016-12-17 DIAGNOSIS — Z818 Family history of other mental and behavioral disorders: Secondary | ICD-10-CM

## 2016-12-17 DIAGNOSIS — R000 Tachycardia, unspecified: Secondary | ICD-10-CM | POA: Insufficient documentation

## 2016-12-17 DIAGNOSIS — Z79899 Other long term (current) drug therapy: Secondary | ICD-10-CM | POA: Insufficient documentation

## 2016-12-17 DIAGNOSIS — R42 Dizziness and giddiness: Secondary | ICD-10-CM | POA: Diagnosis not present

## 2016-12-17 DIAGNOSIS — Z8249 Family history of ischemic heart disease and other diseases of the circulatory system: Secondary | ICD-10-CM | POA: Diagnosis not present

## 2016-12-17 LAB — URINALYSIS, ROUTINE W REFLEX MICROSCOPIC
BILIRUBIN URINE: NEGATIVE
Glucose, UA: NEGATIVE mg/dL
Hgb urine dipstick: NEGATIVE
Ketones, ur: NEGATIVE mg/dL
Nitrite: NEGATIVE
Protein, ur: NEGATIVE mg/dL
SPECIFIC GRAVITY, URINE: 1.01 (ref 1.005–1.030)
pH: 6 (ref 5.0–8.0)

## 2016-12-17 LAB — BASIC METABOLIC PANEL
ANION GAP: 11 (ref 5–15)
BUN: 9 mg/dL (ref 6–20)
CO2: 21 mmol/L — AB (ref 22–32)
Calcium: 9.7 mg/dL (ref 8.9–10.3)
Chloride: 106 mmol/L (ref 101–111)
Creatinine, Ser: 0.74 mg/dL (ref 0.44–1.00)
GFR calc Af Amer: >60 mL/min (ref >60)
GFR calc non Af Amer: >60 mL/min (ref >60)
GLUCOSE: 97 mg/dL (ref 65–99)
POTASSIUM: 3.6 mmol/L (ref 3.5–5.1)
Sodium: 138 mmol/L (ref 135–145)

## 2016-12-17 LAB — CBC
HEMATOCRIT: 38 % (ref 36.0–46.0)
HEMOGLOBIN: 12.1 g/dL (ref 12.0–15.0)
MCH: 25.3 pg — AB (ref 26.0–34.0)
MCHC: 31.8 g/dL (ref 30.0–36.0)
MCV: 79.5 fL (ref 78.0–100.0)
Platelets: 347 10*3/uL (ref 150–400)
RBC: 4.78 MIL/uL (ref 3.87–5.11)
RDW: 20.2 % — ABNORMAL HIGH (ref 11.5–15.5)
WBC: 6.9 10*3/uL (ref 4.0–10.5)

## 2016-12-17 LAB — TROPONIN I: Troponin I: <0.03 ng/mL (ref <0.03)

## 2016-12-17 MED ORDER — TOPIRAMATE 50 MG PO TABS: ORAL_TABLET | ORAL | 2 refills | Status: DC

## 2016-12-17 MED ORDER — DULOXETINE HCL 60 MG PO CPEP: 60.0000 mg | ORAL_CAPSULE | Freq: Two times a day (BID) | ORAL | 2 refills | Status: DC

## 2016-12-17 MED ORDER — AMPHETAMINE-DEXTROAMPHET ER 30 MG PO CP24: 30.0000 mg | ORAL_CAPSULE | Freq: Every day | ORAL | 0 refills | Status: DC

## 2016-12-17 MED ORDER — CARBAMAZEPINE 200 MG PO TABS
ORAL_TABLET | ORAL | 2 refills | Status: DC
Start: 2016-12-17 — End: 2017-02-09

## 2016-12-17 MED ORDER — CLONAZEPAM 0.5 MG PO TABS: 0.5000 mg | ORAL_TABLET | Freq: Three times a day (TID) | ORAL | 2 refills | Status: DC

## 2016-12-17 NOTE — ED Provider Notes (Signed)
[place] DEPT Provider Note   CSN: [id number] Arrival date & time: 12/17/16  1424     History   Chief Complaint Chief Complaint  Patient presents with  . Chest Pain  . Dizziness    HPI Penny [name] is a 31 y.o. female.  HPI  31 y/o female - hx of anemia (gastric bypass and recent transfusion), depression and pseudoseizures.  She reports that since being d/c last Friday from [place], she has had increasing dark stools, dizzy and SOB with exertion.  She had been admitted for unremitting abd pain (in pain clinic).  She is worried about anemia.  She is having midsternal CP which radiates to the R.  No t positional, not linked to exertional sx.    Past Medical History:  Diagnosis Date  . ADD (attention deficit disorder)   . Anemia 2011   2o to GASTRIC BYPASS  . Anginal pain ([place])   . Anxiety   . Blood transfusion without reported diagnosis   . Chronic daily headache   . Depression   . Depression   . Dysmenorrhea 09/18/2013  . Dysrhythmia   . Elevated liver enzymes JUL 2011ALK PHOS 111-127 AST  143-267 ALT  213-321T BILI 0.6  ALB  3.7-4.06 Jun 2011 ALK PHOS 118 AST 24 ALT 42 T BILI 0.4 ALB 3.9  . Encounter for drug rehabilitation    at behavioral health for opioid addiction about 4 years ago  . Family history of adverse reaction to anesthesia    'dad had to be kept on pump for breathing for morphine'  . Fatty liver   . Fibromyalgia   . Gastric bypass status for obesity   . Gastritis JULY 2011  . Heart murmur   . 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 History of Holter monitoring   . Hx of opioid abuse    for about 4 years, about 4 years ago  . Interstitial cystitis   . Iron deficiency anemia 07/23/2010  . Irritable bowel syndrome 2012 DIARRHEA   JUN 2012 TTG IgA 14.9  . IUD FEB 2010  . Lupus   . Menorrhagia 07/18/2013  . Migraines   . Obesity (BMI 30-39.9) 2011 228 LBS BMI 36.8  . Ovarian cyst   . Patient desires pregnancy  09/18/2013  . PONV (postoperative nausea and vomiting) seizure post-operatively  . Potassium (K) deficiency   . Pregnant 12/25/2013  . Psychiatric pseudoseizure   . RLQ abdominal pain 07/18/2013  . Sciatica of left side 11/14/2014  . 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 Stress 09/18/2013    Patient Active Problem List   Diagnosis Date Noted  . Chronic pain syndrome 12/05/2016  . Folate deficiency 12/04/2016  . Nausea vomiting and diarrhea 12/04/2016  . Severe anemia 12/04/2016  . Symptomatic anemia 12/03/2016  . Complaints of leg weakness   . Paresthesia   . Left-sided weakness   . Uncontrolled pain 01/24/2016  . Malnutrition of moderate degree 01/24/2016  . Neuropathy ([place]) 01/23/2016  . Inability to walk 01/23/2016  . Paresthesias   . Bilateral leg numbness 11/26/2015  . Major depressive disorder, recurrent episode, 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[place]) 05/10/2015  . Post partum depression 05/09/2015  . Anastomotic ulcer   . Dysphagia   . Hematemesis 04/07/2015  . Intractable vomiting with nausea 04/07/2015  . Elevated liver enzymes   . Epigastric pain   . Pseudoseizure ([place]) 02/22/2015  . Seizure-like activity ([place]) 02/22/2015  . Fibromyalgia 02/18/2015  .  Vulvar fissure 02/18/2015  . Clinical depression 02/01/2015  . Current smoker 01/29/2015  . Current tobacco use 01/29/2015  . 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 Leg weakness, bilateral 01/02/2015  . Gait disorder 01/02/2015  . Other symptoms and signs involving the musculoskeletal system 01/02/2015  . Abnormal gait 01/02/2015  . Cervicalgia 12/18/2014  . Sciatica of left side 11/14/2014  . Neuralgia neuritis, sciatic nerve 11/14/2014  . 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 Family planning 09/09/2014  . Abdominal pain, acute, right lower quadrant 08/22/2014  . Headache, migraine 08/19/2014  . Seizure ([place]) 08/18/2014  . Encephalopathy 08/13/2014  . Headache 08/13/2014  . Altered  mental status 08/13/2014  . Right leg numbness 07/31/2014  . Disturbance of skin sensation 07/31/2014  . Hypertension in pregnancy, transient 07/08/2014  . Previous gastric bypass affecting pregnancy, antepartum 05/22/2014  . 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 ASD (atrial septal defect), ostium secundum 05/17/2014  . Depression complicating pregnancy in second trimester, antepartum 05/09/2014  . Antepartum mental disorder in pregnancy 05/09/2014  . Rapid palpitations 02/18/2014  . H/O maternal third degree perineal laceration, currently pregnant 02/18/2014  . High-risk pregnancy 02/18/2014  . Supervision of pregnancy with other poor reproductive or obstetric history, unspecified trimester 02/18/2014  . Maternal iron deficiency anemia complicating pregnancy 11/94/1740  . Restless legs 01/16/2014  . Restless leg 01/16/2014  . Chronic interstitial cystitis 10/23/2013  . Menorrhagia 07/18/2013  . Excessive and frequent menstruation 07/18/2013  . h/o Opiate addiction 03/11/2012    Class: Acute  . History of migraine headaches 03/10/2012    Class: Acute  . Depression with anxiety 03/10/2012    Class: Chronic  . 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 Panic disorder without agoraphobia 03/10/2012  . H/O disease 03/10/2012  . Dysthymia 03/10/2012  . Pelvic congestion syndrome 10/13/2011  . Coitalgia 10/13/2011  . Chronic migraine without aura 10/13/2011  . Unspecified dyspareunia 10/13/2011  . IBS (irritable bowel syndrome) 08/25/2011  . Diarrhea 05/27/2011  . OBESITY, UNSPECIFIED 09/17/2010  . Transaminitis 09/17/2010  . Anemia, iron deficiency 07/23/2010    Past Surgical History:  Procedure Laterality Date  . BIOPSY  04/10/2015   Procedure: BIOPSY;  Surgeon: Daneil Dolin, MD;  Location: [facility];  Service:  Endoscopy;;  . cath self every nite     for sodium bicarb injection (discontinued 2013)  . CHOLECYSTECTOMY  2005   biliary dyskinesia  . COLONOSCOPY  JUN 2012 ABD PN/DIARRHEA WITH PROPOFOL   NL COLON  . DILATION AND CURETTAGE OF UTERUS    . ESOPHAGEAL DILATION N/A 04/10/2015   Procedure: ESOPHAGEAL DILATION WITH 54FR MALONEY DILATOR;  Surgeon: Daneil Dolin, MD;  Location: [facility];  Service: Endoscopy;  Laterality: N/A;  . ESOPHAGOGASTRODUODENOSCOPY    . ESOPHAGOGASTRODUODENOSCOPY (EGD) WITH PROPOFOL N/A 04/10/2015   Procedure: ESOPHAGOGASTRODUODENOSCOPY (EGD) WITH PROPOFOL;  Surgeon: Daneil Dolin, MD;  Location: [facility];  Service: Endoscopy;  Laterality: N/A;  . ESOPHAGOGASTRODUODENOSCOPY (EGD) WITH PROPOFOL N/A 12/06/2016   Procedure: ESOPHAGOGASTRODUODENOSCOPY (EGD) WITH PROPOFOL;  Surgeon: Danie Binder, MD;  Location: [facility];  Service: Endoscopy;  Laterality: N/A;  . GAB  2007   in [place]-POUCH 5 CM  . GASTRIC BYPASS  06/2006  . HYSTEROSCOPY W/D&C N/A 09/12/2014   Procedure: DILATATION AND CURETTAGE /HYSTEROSCOPY;  Surgeon: Jonnie Kind, MD;  Location: [facility];  Service: Gynecology;  Laterality: N/A;  . REPAIR VAGINAL CUFF N/A 07/30/2014   Procedure: REPAIR VAGINAL CUFF;  Surgeon: Mora Bellman, MD;  Location: [facility];  Service: Gynecology;  Laterality: N/A;  . SAVORY DILATION  06/20/2012   Dr. Barnie Alderman gastritis/Ulcer in the mid jejunum. Empiric dilation.   . SURAL NERVE BX Left 02/25/2016   Procedure: LEFT SURAL NERVE BIOPSY;  Surgeon: Jovita Gamma, MD;  Location: [facility];  Service: Neurosurgery;  Laterality: Left;  Left sural nerve biopsy  . TONSILLECTOMY    . TONSILLECTOMY AND ADENOIDECTOMY    . UPPER GASTROINTESTINAL ENDOSCOPY  JULY 2011 NAUSEA-D125,V6, PH 25   Bx; GASTRITIS, POUCH-5 CM LONG  . WISDOM TOOTH EXTRACTION      OB History    Gravida Para Term Preterm AB Living   '3 2 1 1 1 2   '$ SAB TAB Ectopic Multiple Live Births   1       2       Home  Medications    Prior to Admission medications   Medication Sig Start Date End Date Taking? Authorizing Provider  albuterol (PROVENTIL HFA;VENTOLIN HFA) 108 (90 Base) MCG/ACT inhaler Inhale 2 puffs into the lungs every 6 (six) hours as needed for wheezing. 05/28/16  Yes Kathyrn Drown, MD  amphetamine-dextroamphetamine (ADDERALL XR) 30 MG 24 hr capsule Take 1 capsule (30 mg total) by mouth daily. 12/17/16  Yes Cloria Spring, MD  carbamazepine (TEGRETOL) 200 MG tablet Take two tablets at bedtime Patient taking differently: Take 400 mg by mouth at bedtime. Take two tablets at bedtime 12/17/16  Yes Cloria Spring, MD  clonazePAM (KLONOPIN) 0.5 MG tablet Take 1 tablet (0.5 mg total) by mouth 3 (three) times daily. 12/17/16 12/17/17 Yes Cloria Spring, MD  DULoxetine (CYMBALTA) 60 MG capsule Take 1 capsule (60 mg total) by mouth 2 (two) times daily. 12/17/16  Yes Cloria Spring, MD  folic acid (FOLVITE) 1 MG tablet Take 1 tablet (1 mg total) by mouth daily. 12/09/16  Yes Hosie Poisson, MD  HYDROcodone-acetaminophen (NORCO) 10-325 MG tablet Take 1 tablet by mouth every 6 (six) hours as needed. 09/28/16  Yes Cloria Spring, MD  Pantoprazole Sodium (PROTONIX PO) Take 40 mg by mouth daily.   Yes Historical Provider, MD  promethazine (PHENERGAN) 25 MG tablet Take 25 mg by mouth every 6 (six) hours as needed for nausea or vomiting.   Yes Historical Provider, MD  promethazine (PHENERGAN) 25 MG/ML injection Inject 0.5 mLs (12.5 mg total) into the vein every 6 (six) hours as needed for nausea or vomiting. 12/08/16  Yes Hosie Poisson, MD  tiZANidine (ZANAFLEX) 2 MG tablet Take 1 tablet by mouth TID  prn Patient taking differently: Take 2 mg by mouth every 8 (eight) hours as needed for muscle spasms.  09/02/16  Yes Kathyrn Drown, MD  topiramate (TOPAMAX) 50 MG tablet Taking 1 in QAm and 2 QHS Patient taking differently: Take 50-100 mg by mouth 2 (two) times daily. TAKE '50MG'$  IN THE MORNING AND '100MG'$  AT BEDTIME 12/17/16  Yes  Cloria Spring, MD  vitamin B-12 100 MCG tablet Take 1 tablet (100 mcg total) by mouth daily. 12/08/16  Yes Hosie Poisson, MD  amphetamine-dextroamphetamine (ADDERALL XR) 30 MG 24 hr capsule Take 1 capsule (30 mg total) by mouth daily. Patient not taking: Reported on 12/17/2016 12/17/16   Cloria Spring, MD    Family History Family History  Problem Relation Age of Onset  .  Hemochromatosis Maternal Grandmother   . Migraines Maternal Grandmother   . Cancer Maternal Grandmother   . Breast cancer Maternal Grandmother   . Hypertension Father   . Diabetes Father   . Coronary artery disease Father   . Migraines Paternal Grandmother   . Breast cancer Paternal Grandmother   . Cancer Mother     breast  . Hemochromatosis Mother   . Breast cancer Mother   . Depression Mother   . Anxiety disorder Mother   . Coronary artery disease Paternal Grandfather   . Anxiety disorder Brother   . Bipolar disorder Brother   . Healthy Son   . Healthy Daughter     Social History Social History  Substance Use Topics  . Smoking status: Current Every Day Smoker    Packs/day: 0.10    Years: 6.00    Types: Cigarettes  . Smokeless tobacco: Never Used     Comment: Patient is down to 1 a day and quiting on her own  . Alcohol use 0.0 oz/week     Comment: once every weeks - maybe a beer with a dinner, rarely     Allergies   Gabapentin; Metoclopramide hcl; Tramadol; Morphine and related; Nucynta [tapentadol]; Propofol; Trazodone and nefazodone; Zofran [ondansetron]; Butrans [buprenorphine]; Latex; Lyrica [pregabalin]; and Tape   Review of Systems Review of Systems  All other systems reviewed and are negative.    Physical Exam Updated Vital Signs BP 116/78   Pulse 87   Temp 97.6 F (36.4 C) (Oral)   Resp 14   Ht '5\' 6"'$  (1.676 m)   Wt 213 lb (96.6 kg)   LMP 11/22/2016   SpO2 100%   BMI 34.38 kg/m   Physical Exam  Constitutional: She appears well-developed and well-nourished. No distress.    HENT:  Head: Normocephalic and atraumatic.  Mouth/Throat: Oropharynx is clear and moist. No oropharyngeal exudate.  Eyes: Conjunctivae and EOM are normal. Pupils are equal, round, and reactive to light. Right eye exhibits no discharge. Left eye exhibits no discharge. No scleral icterus.  Neck: Normal range of motion. Neck supple. No JVD present. No thyromegaly present.  Cardiovascular: Normal rate, regular rhythm, normal heart sounds and intact distal pulses.  Exam reveals no gallop and no friction rub.   No murmur heard. Pulmonary/Chest: Effort normal and breath sounds normal. No respiratory distress. She has no wheezes. She has no rales.  Abdominal: Soft. Bowel sounds are normal. She exhibits no distension and no mass. There is no tenderness ( some RUQ ttp, no other ttp).  Musculoskeletal: Normal range of motion. She exhibits no edema or tenderness.  Lymphadenopathy:    She has no cervical adenopathy.  Neurological: She is alert. Coordination normal.  Skin: Skin is warm and dry. No rash noted. No erythema.  Psychiatric: She has a normal mood and affect. Her behavior is normal.  Nursing note and vitals reviewed.    ED Treatments / Results  Labs (all labs ordered are listed, but only abnormal results are displayed) Labs Reviewed  BASIC METABOLIC PANEL - Abnormal; Notable for the following:       Result Value   CO2 21 (*)    All other components within normal limits  CBC - Abnormal; Notable for the following:    MCH 25.3 (*)    RDW 20.2 (*)    All other components within normal limits  URINALYSIS, ROUTINE W REFLEX MICROSCOPIC - Abnormal; Notable for the following:    APPearance CLOUDY (*)    Leukocytes,  UA SMALL (*)    Bacteria, UA RARE (*)    All other components within normal limits  TROPONIN I   EKG  EKG Interpretation  Date/Time:  Friday December 17 2016 14:38:15 EST Ventricular Rate:  80 PR Interval:  172 QRS Duration: 82 QT Interval:  392 QTC Calculation: 452 R  Axis:   27 Text Interpretation:  Normal sinus rhythm Normal ECG since last tracing no significant change Confirmed by Sabra Heck  MD, Annya Lizana ([id number]) on 12/17/2016 4:22:09 PM      Radiology Dg Chest 2 View  Result Date: 12/17/2016 CLINICAL DATA:  Chest pain and dizziness. EXAM: CHEST  2 VIEW COMPARISON:  08/31/2016 FINDINGS: The heart size and mediastinal contours are within normal limits. Both lungs are clear. The visualized skeletal structures are unremarkable. IMPRESSION: Normal chest. Electronically Signed   By: Lorriane Shire M.D.   On: 12/17/2016 15:02    Procedures Procedures (including critical care time)  Medications Ordered in ED Medications - No data to display   Initial Impression / Assessment and Plan / ED Course  I have reviewed the triage vital signs and the nursing notes.  Pertinent labs & imaging results that were available during my care of the patient were reviewed by me and considered in my medical decision making (see chart for details).  Clinical Course as of Dec 17 1930  Fri Dec 17, 2016  1975 Specific Gravity, Urine: 1.010 [BM]    Clinical Course User Index [BM] Noemi Chapel, MD   Patient looks well on exam, she does have a slight tachycardia when she sits and stands but has no hypotension, her hemoglobin is stable, electrolytes looked normal, troponin is also normal as is the chest x-ray and EKG. The patient is concerned that she is not making much urine. I suspect that part of the reason that she is feeling some shortness of breath is related to her underlying psychiatric disorder that she could be third spacing some of the fluid she received while in the hospital. She does not appear to be fluid overloaded clinically, she has no edema of her legs, she has no rales on pulmonary exam and no hypoxia. We'll obtain urinalysis prior to discharge.  UA neg  Stable for dc/  Final Clinical Impressions(s) / ED Diagnoses   Final diagnoses:  Dizziness    New  Prescriptions Discharge Medication List as of 12/17/2016  7:09 PM       Noemi Chapel, MD 12/17/16 1932
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
## 2016-12-17 NOTE — Telephone Encounter (Signed)
Patient will need office visit. She should have CBC and LFTs prior to office visit

## 2016-12-17 NOTE — Telephone Encounter (Signed)
This message was sent to Dr.Rehman to address.

## 2016-12-17 NOTE — Discharge Instructions (Signed)
Your tests are nomral Drink lots of fluids See your doctor in 2 days for recheck  Please obtain all of your results from medical records or have your doctors office obtain the results - share them with your doctor - you should be seen at your doctors office in the next 2 days. Call today to arrange your follow up. Take the medications as prescribed. Please review all of the medicines and only take them if you do not have an allergy to them. Please be aware that if you are taking birth control pills, taking other prescriptions, ESPECIALLY ANTIBIOTICS may make the birth control ineffective - if this is the case, either do not engage in sexual activity or use alternative methods of birth control such as condoms until you have finished the medicine and your family doctor says it is OK to restart them. If you are on a blood thinner such as COUMADIN, be aware that any other medicine that you take may cause the coumadin to either work too much, or not enough - you should have your coumadin level rechecked in next 7 days if this is the case.  ?  It is also a possibility that you have an allergic reaction to any of the medicines that you have been prescribed - Everybody reacts differently to medications and while MOST people have no trouble with most medicines, you may have a reaction such as nausea, vomiting, rash, swelling, shortness of breath. If this is the case, please stop taking the medicine immediately and contact your physician.  ?  You should return to the ER if you develop severe or worsening symptoms.

## 2016-12-17 NOTE — Telephone Encounter (Signed)
Patient presented to the office this morning.  She stated that Dr. Oneida Alar did her endoscopy and then Dr. Laural Golden followed up with her.  She was transferred to [facility].  The patient is stating that Mina Marble is now telling her that she needs to follow back up with Dr. Laural Golden and have the study with the capsule and light done.  She stated that she is having the same symptoms as before, but she is now also having shortness of breath.  No nausea, but having diarrhea.  Stated that she has not eaten in week.  She stated that she got iron while she was at [facility], but no more blood.  Able to get some fluids in.  Please advise how to handle.  [phone]
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
## 2016-12-18 IMAGING — MR MR LUMBAR SPINE W/O CM
4 of 6 series · 19 of 48 positions shown · non-contrast
Comparison: 07/30/2014

CLINICAL DATA: Sciatica. Lower extremity pain and spasms with
multiple falls in the past 24 hr.

EXAM:
MRI LUMBAR SPINE WITHOUT CONTRAST
TECHNIQUE: Multiplanar, multisequence MR imaging of the lumbar spine was
performed. No intravenous contrast was administered.

[Series 4: T2 · sagittal · 4.0mm · 0.55mm/px · 4 of 15 slices shown (1 of 2)]
[im 1/15]
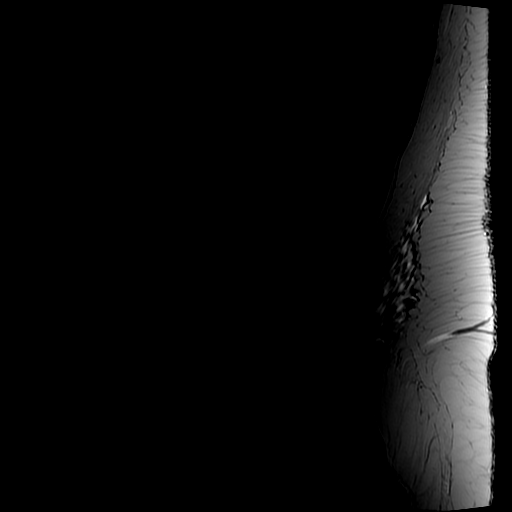
[im 5/15]
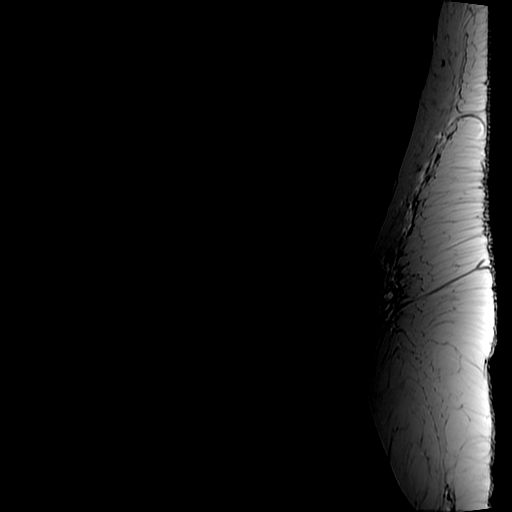
[im 10/15]
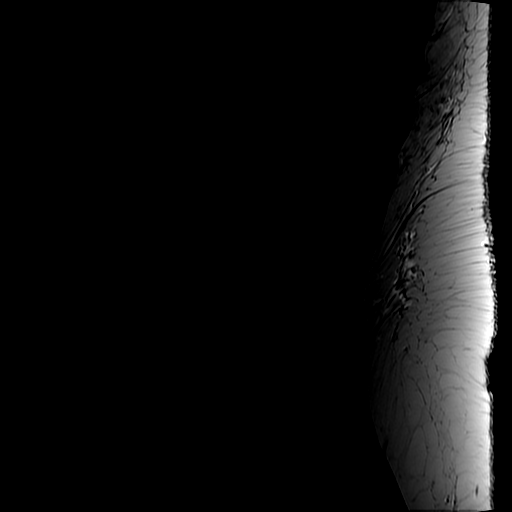
[im 15/15]
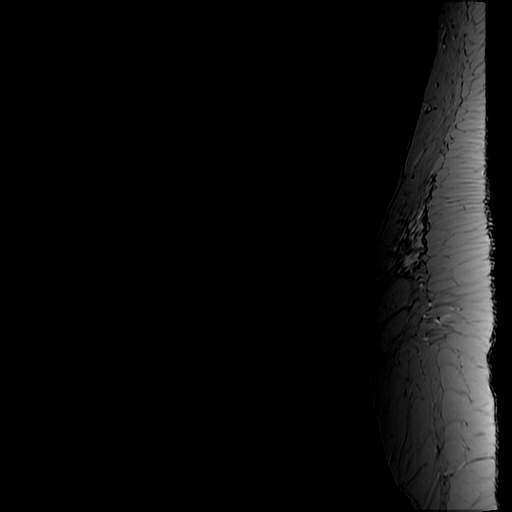

[Series 6: T1 · sagittal · 4.0mm · 0.55mm/px · 3 of 15 slices shown (1 of 2)]
[im 1/15]
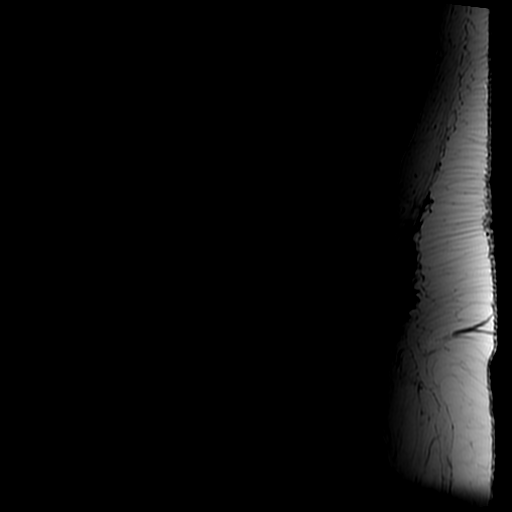
[im 10/15]
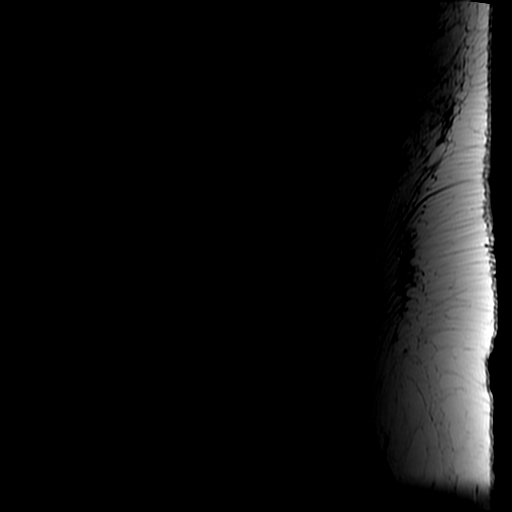
[im 15/15]
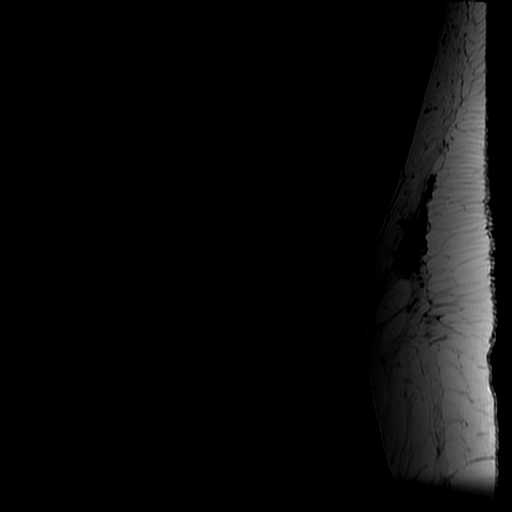

[Series 8: T2 · axial · 4.0mm · 0.39mm/px · z∈[-82,+155]mm · 9 of 43 slices shown (2 of 2)]
[im 1/43]
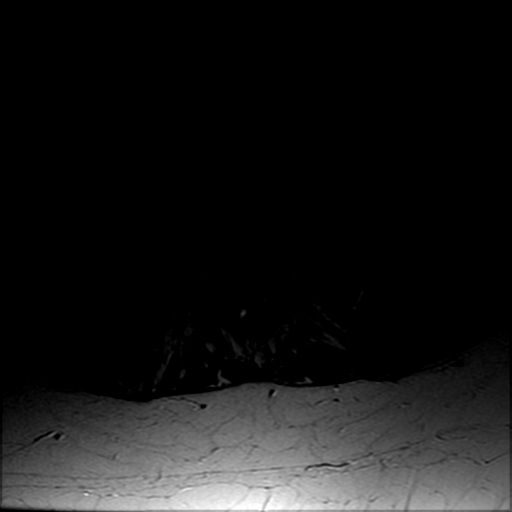
[im 8/43]
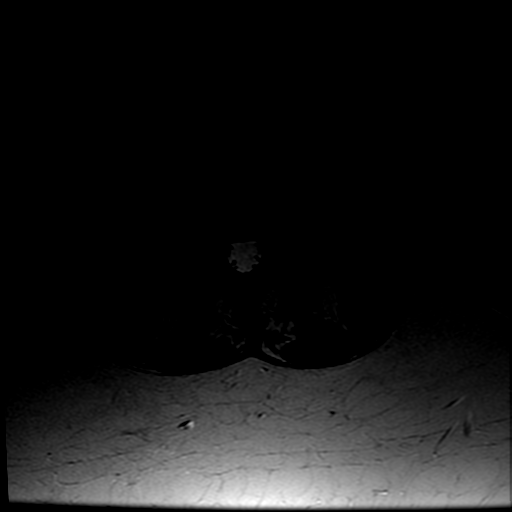
[im 15/43]
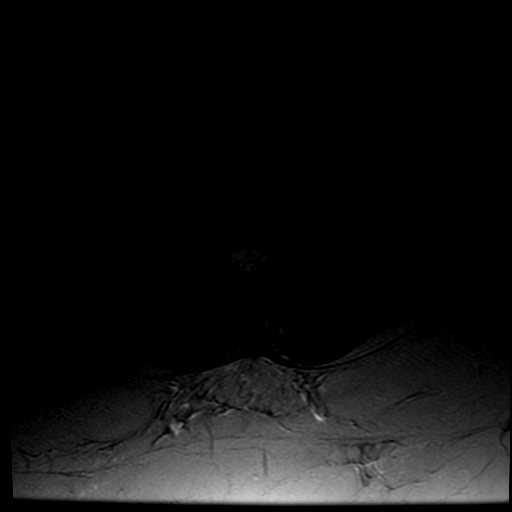
[im 18/43]
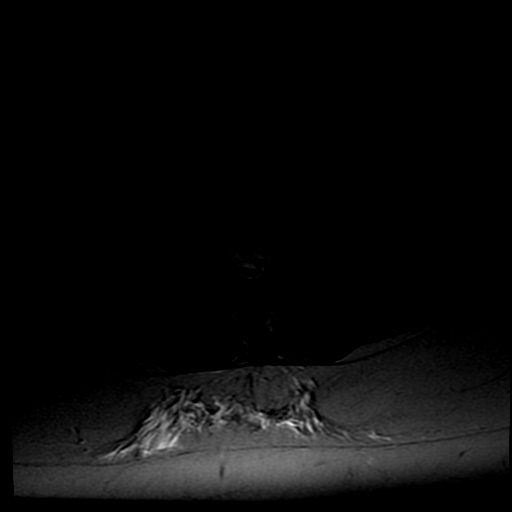
[im 22/43]
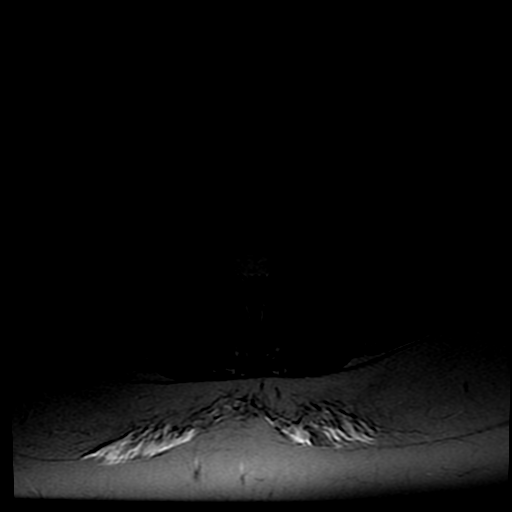
[im 25/43]
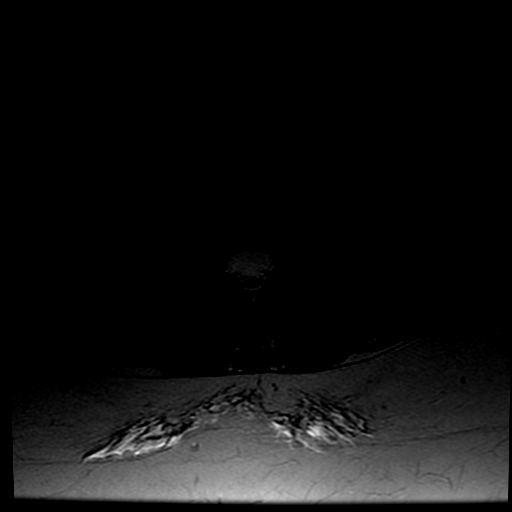
[im 29/43]
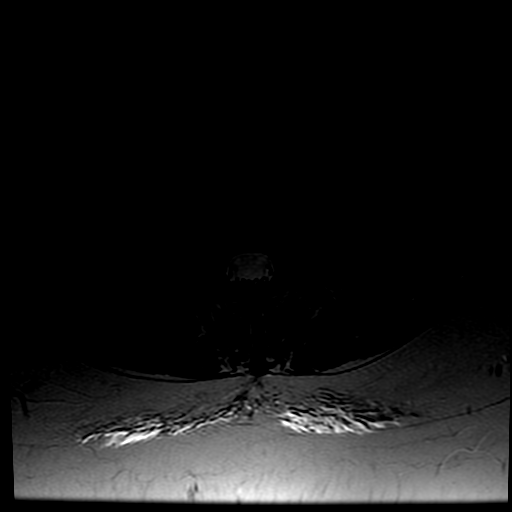
[im 36/43]
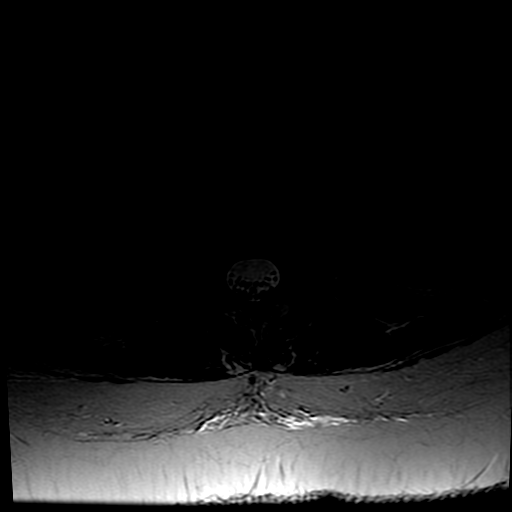
[im 43/43]
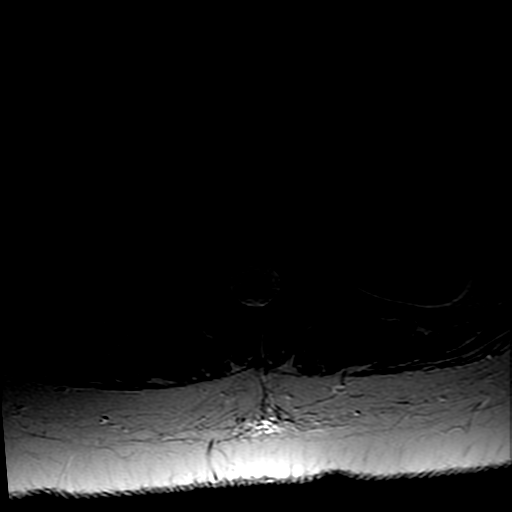

[Series 9: T1 · axial · 4.0mm · 0.39mm/px · z∈[-47,+121]mm · 3 of 43 slices shown (2 of 2)]
[im 8/43]
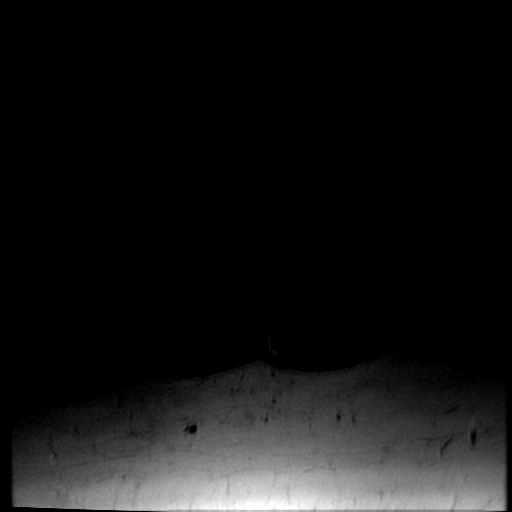
[im 22/43]
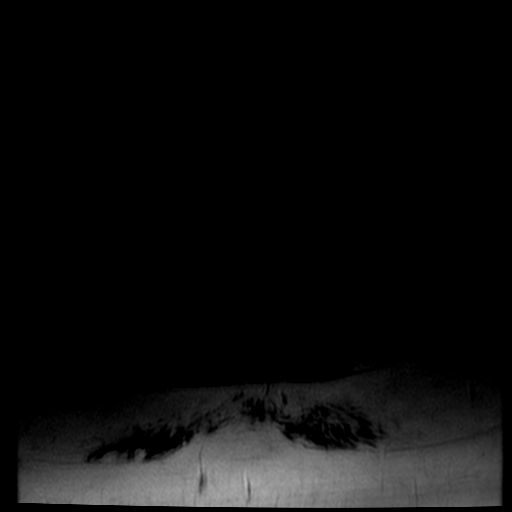
[im 36/43]
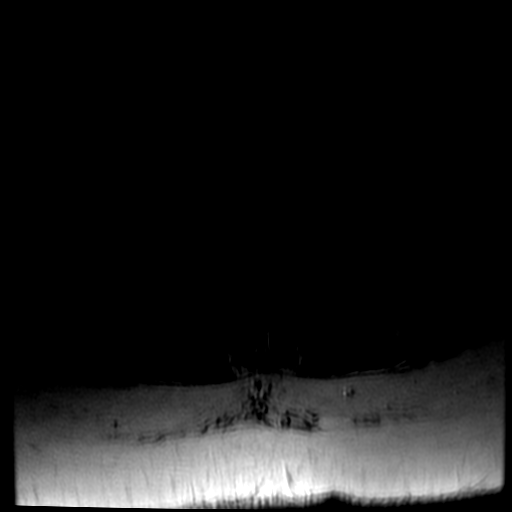

[19 of 48 positions shown; findings below may reference images not displayed]

FINDINGS: Vertebral alignment is normal. Vertebral body heights are preserved.
No vertebral marrow edema is seen. Disc desiccation and very mild
disc space narrowing are present at L3-4 and L4-5, similar to prior.
Other intervertebral discs are well hydrated and maintained in
height. Conus medullaris is normal in signal and terminates at L1.
3.5 cm left ovarian cyst is partially visualized.

L1-2:  Negative.

L2-3:  Minimal facet hypertrophy.  No disc herniation or stenosis.

L3-4: Small right central disc protrusion with annular fissure
appears mildly larger/more focal than on the prior study. This
approaches but does not clearly displace the traversing right L4
nerve root. No spinal stenosis or neural foraminal stenosis. Mild
facet hypertrophy.

L4-5: Shallow right central disc protrusion with annular fissure is
similar to the prior study, in close proximity to the traversing
right L5 nerve root without clear impingement. No spinal canal or
neural foraminal stenosis. Mild facet hypertrophy.

L5-S1:  Mild facet hypertrophy.  No disc herniation or stenosis.
IMPRESSION: 1. Small right central disc protrusion at L3-4, larger than on the
prior study but without evidence of neural impingement.
2. Unchanged, shallow disc protrusion at L4-5 without stenosis.

## 2016-12-20 NOTE — Telephone Encounter (Signed)
Patient was called and ask about coming in tomorrow , 12/21/2016 on Terri's schedule but Dr.Rehman would see her. I ask that she call our office back so that we could arrange the lab work and the appointment time.

## 2016-12-21 NOTE — Telephone Encounter (Signed)
Patient called at 4:31pm today and stated that she had just gotten [name]'s message.  I explained to her that we were going to get her to come in today on Terri's schedule while Dr. Laural Golden was in the office.  She stated that she went to the ER and they did labs.  She wants to know if Dr. Laural Golden can look at those labs instead of her going for more.  I told her I would ask and we'd go from there to set up an appointment.  [phone]

## 2016-12-24 ENCOUNTER — Other Ambulatory Visit (INDEPENDENT_AMBULATORY_CARE_PROVIDER_SITE_OTHER): Payer: Self-pay | Admitting: *Deleted

## 2016-12-24 DIAGNOSIS — K588 Other irritable bowel syndrome: Secondary | ICD-10-CM

## 2016-12-24 NOTE — Telephone Encounter (Signed)
Per Dr.Rehman the patient will need to have LT prior to OV. Patient was called and made aware.

## 2016-12-28 ENCOUNTER — Telehealth (INDEPENDENT_AMBULATORY_CARE_PROVIDER_SITE_OTHER): Payer: Self-pay | Admitting: Internal Medicine

## 2016-12-28 ENCOUNTER — Telehealth (HOSPITAL_COMMUNITY): Payer: Self-pay | Admitting: *Deleted

## 2016-12-28 ENCOUNTER — Ambulatory Visit (INDEPENDENT_AMBULATORY_CARE_PROVIDER_SITE_OTHER): Payer: Medicaid Other | Admitting: Psychiatry

## 2016-12-28 ENCOUNTER — Encounter (HOSPITAL_COMMUNITY): Payer: Self-pay | Admitting: Psychiatry

## 2016-12-28 DIAGNOSIS — F332 Major depressive disorder, recurrent severe without psychotic features: Secondary | ICD-10-CM | POA: Diagnosis not present

## 2016-12-28 NOTE — Telephone Encounter (Signed)
Dr.Rehman was made aware. 

## 2016-12-28 NOTE — Telephone Encounter (Signed)
Patient presented to the office to let us know that she went and had her lab work done today.  She stated that she has still been seeing black spots, dizziness and still really tired.  [phone]

## 2016-12-28 NOTE — Progress Notes (Signed)
   THERAPIST PROGRESS NOTE  Session Time:   Tuesday 12/28/2016 2:12 PM - 2:51 PM     Participation Level: Active  Behavioral Response: CasualAlertAnxious and Depressed  Type of Therapy: Individual Therapy   Treatment Goals: 1. Implement coping and behavioral strategies to overcome depression.    2. Implement calming and coping techniques to decrease intensity and frequency of anxiety response  Treatment Goals addressed:  1,  Interventions: CBT and Supportive  Summary: Penny [name] is a 31 y.o. female who initially was referred for services by psychiatrist Dr. Harrington Challenger in 2016  to improve coping skills. Patient was treated inpatient at [facility] in [place] in July 2016 for depression. She reports suffering for depression since she delivered her now six-year-old son and suffered "baby blues". Patient reports becoming depressed again when she was 7 months pregnant with her second child as she was put on complete bedrest. Also during this time, her parents separated after 52 years of marriage. Patient reports her health worsened overall and she began having seizures the day before delivering her second child in August 2015. She says this pregnancy was very stressful on her marriage and having more stress when baby was born prematurely and had to stay in NICU. 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  She reports stress related to poor relationship with father due to issues with his girlfriend.states her dad's girlfriend hates her and tries to keep dad out of patient's life. 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    Patient last was seen about 7-8 weeks ago. She reports  missing appointments as she was sick and hospitalized for 8 days due to experiencing chronic anemia. Prior to her hospitalization, she and her husband reconciled. Patient moved back in with husband in November 2017. While she was in the hospital, he packed all her belongings and told her the marriage wasn't going to work out when she was discharged. Patient expresses frustration about this and initially states she can handle things but just is more concerned about how this has affected her son. She states feeling as though she let down son. She denies any depression. Patient moved back in with her mother who has remained very supportive. She also reports being more assertive with mother after having more support from mother's fiancee.      Suicidal/Homicidal: No  Therapist Response: Reviewed symptoms, used nondirective techniques to allow patient to verbalize feelings about situation with husband, discussed the connection between internalizing feelings and anxiety/depression, assisted patient to identify coping statements to dispel inappropriate guilt regarding son.   Plan: Return again in 2 weeks.  Diagnosis: Axis I: Anxiety Disorder NOS and Depressive Disorder NOS    Axis II: Deferred    Penny Herbert, LCSW 12/28/2016

## 2016-12-28 NOTE — Telephone Encounter (Signed)
patient was asked to return in two weeks, she refused  slots offered, and scheduled Feb. 21, 2018.

## 2016-12-29 LAB — HEPATIC FUNCTION PANEL
ALBUMIN: 4 g/dL (ref 3.6–5.1)
ALT: 25 U/L (ref 6–29)
AST: 22 U/L (ref 10–30)
Alkaline Phosphatase: 118 U/L — ABNORMAL HIGH (ref 33–115)
BILIRUBIN DIRECT: 0.1 mg/dL (ref <=0.2)
Indirect Bilirubin: 0.2 mg/dL (ref 0.2–1.2)
TOTAL PROTEIN: 6.3 g/dL (ref 6.1–8.1)
Total Bilirubin: 0.3 mg/dL (ref 0.2–1.2)

## 2016-12-30 ENCOUNTER — Telehealth (INDEPENDENT_AMBULATORY_CARE_PROVIDER_SITE_OTHER): Payer: Self-pay | Admitting: Internal Medicine

## 2016-12-30 NOTE — Telephone Encounter (Signed)
Patient called, would like to know if we have the results of her blood work back.  [phone]

## 2016-12-31 NOTE — Telephone Encounter (Signed)
Patient has an appointment with Dr.Rehman on 01/04/2017.

## 2016-12-31 NOTE — Telephone Encounter (Signed)
Patient was given an appointment for 01/04/17 at 8:30am with Dr. Laural Golden.

## 2017-01-04 ENCOUNTER — Encounter (INDEPENDENT_AMBULATORY_CARE_PROVIDER_SITE_OTHER): Payer: Self-pay | Admitting: Internal Medicine

## 2017-01-04 ENCOUNTER — Ambulatory Visit (INDEPENDENT_AMBULATORY_CARE_PROVIDER_SITE_OTHER): Payer: Medicaid Other | Admitting: Internal Medicine

## 2017-01-04 ENCOUNTER — Telehealth (INDEPENDENT_AMBULATORY_CARE_PROVIDER_SITE_OTHER): Payer: Self-pay | Admitting: Internal Medicine

## 2017-01-04 VITALS — BP 110/80 | HR 64 | Temp 97.2°F | Resp 18 | Ht 66.0 in | Wt 215.9 lb

## 2017-01-04 DIAGNOSIS — R1011 Right upper quadrant pain: Secondary | ICD-10-CM

## 2017-01-04 DIAGNOSIS — R748 Abnormal levels of other serum enzymes: Secondary | ICD-10-CM

## 2017-01-04 DIAGNOSIS — R1031 Right lower quadrant pain: Secondary | ICD-10-CM | POA: Diagnosis not present

## 2017-01-04 DIAGNOSIS — G8929 Other chronic pain: Secondary | ICD-10-CM

## 2017-01-04 MED ORDER — PROMETHAZINE HCL 25 MG PO TABS: 25.0000 mg | ORAL_TABLET | Freq: Two times a day (BID) | ORAL | 0 refills | Status: DC | PRN

## 2017-01-04 NOTE — Progress Notes (Signed)
Presenting complaint;  Follow-up for abdominal pain and elevated transaminases.  Database and Subjective:  Penny [name] is 31 year old Caucasian female who is in for scheduled visit. She was last seen while hospitalized at [facility] for multiple symptoms including diarrhea nausea vomiting and right-sided abdominal pain. She had normal transaminases on admission but these increased significantly. AST peaked to 455 and ALT to 461. She had mildly dilated CBD on imaging studies. She was felt to have either SOD dysfunction or microlithiasis. I felt she needed an ERCP because of prior gastric bypass surgery I felt he should be attempted at a tertiary center. Patient was transferred to [facility]. Patient did not undergo endoscopic evaluation. She is advised to follow-up at liver clinic. She was treated with pain medication and finally discharged. She continues to complain of pain in right upper and right low quadrant of her abdomen. She says this is a constant pain. She has tried Lidoderm patch in the past but it has not helped. She is having intermittent nausea and vomiting but no hematemesis.. She is taking pain medication as well as Zanaflex twice daily. Diarrhea has resolved. She is having 5-7 bowel movements per week. Most of her stools are small but at least once a week she has good evacuation. She does not have a good appetite. She has lost few pounds recently.     Current Medications: Outpatient Encounter Prescriptions as of 01/04/2017  Medication Sig  . albuterol (PROVENTIL HFA;VENTOLIN HFA) 108 (90 Base) MCG/ACT inhaler Inhale 2 puffs into the lungs every 6 (six) hours as needed for wheezing.  Marland Kitchen amphetamine-dextroamphetamine (ADDERALL XR) 30 MG 24 hr capsule Take 1 capsule (30 mg total) by mouth daily.  . carbamazepine (TEGRETOL) 200 MG tablet Take two tablets at bedtime (Patient taking differently: Take 400 mg by mouth at bedtime. Take two tablets at bedtime)  . clonazePAM (KLONOPIN) 0.5 MG tablet Take 1 tablet  (0.5 mg total) by mouth 3 (three) times daily.  . DULoxetine (CYMBALTA) 60 MG capsule Take 1 capsule (60 mg total) by mouth 2 (two) times daily.  Marland Kitchen HYDROcodone-acetaminophen (NORCO) 10-325 MG tablet Take 1 tablet by mouth every 6 (six) hours as needed.  . Pantoprazole Sodium (PROTONIX PO) Take 40 mg by mouth daily.  . promethazine (PHENERGAN) 25 MG tablet Take 25 mg by mouth every 6 (six) hours as needed for nausea or vomiting.  Marland Kitchen tiZANidine (ZANAFLEX) 2 MG tablet Take 1 tablet by mouth TID  prn (Patient taking differently: Take 2 mg by mouth every 8 (eight) hours as needed for muscle spasms. )  . topiramate (TOPAMAX) 50 MG tablet Taking 1 in QAm and 2 QHS (Patient taking differently: Take 50-100 mg by mouth 2 (two) times daily. TAKE 50MG  IN THE MORNING AND 100MG  AT BEDTIME)  . [DISCONTINUED] amphetamine-dextroamphetamine (ADDERALL XR) 30 MG 24 hr capsule Take 1 capsule (30 mg total) by mouth daily. (Patient not taking: Reported on 12/17/2016)  . [DISCONTINUED] folic acid (FOLVITE) 1 MG tablet Take 1 tablet (1 mg total) by mouth daily. (Patient not taking: Reported on 01/04/2017)  . [DISCONTINUED] promethazine (PHENERGAN) 25 MG/ML injection Inject 0.5 mLs (12.5 mg total) into the vein every 6 (six) hours as needed for nausea or vomiting. (Patient not taking: Reported on 01/04/2017)  . [DISCONTINUED] vitamin B-12 100 MCG tablet Take 1 tablet (100 mcg total) by mouth daily. (Patient not taking: Reported on 01/04/2017)   No facility-administered encounter medications on file as of 01/04/2017.      Objective: Blood pressure 110/80, pulse  64, temperature 97.2 F (36.2 C), temperature source Oral, resp. rate 18, height 5\' 6"  (1.676 m), weight 215 lb 14.4 oz (97.9 kg), last menstrual period 11/22/2016. Patient is alert and in no acute distress. Conjunctiva is pink. Sclera is nonicteric Oropharyngeal mucosa is normal. No neck masses or thyromegaly noted. Cardiac exam with regular rhythm normal S1 and S2.  No murmur or gallop noted. Lungs are clear to auscultation. Abdomen is symmetrical. Bowel sounds normal. Palpation abdomen is soft. She has tenderness both in right upper and lower quadrant. She has both superficial and deep tenderness. No organomegaly or masses. No LE edema or clubbing noted.  Labs/studies Results: LFTs from 12/28/2016  Bilirubin 0.3, AP 118, AST 22, ALT 25 and albumin 4.0.   AST and ALT 420 and 23 respectively on 12/03/2016  AST and ALT were 374 and 164 on 12/05/2016  AST and ALT were 455 and 461 on 12/06/2016    Assessment:  #1. Abnormal LFTs. She has fluctuating transaminases and alkaline phosphatase for at least 2 years. This pattern is not consistent with hepatocellular disease. Pattern is suggestive of biliary disease. She has either SOD function or microlithiasis. Transaminases are now normal. If she has another similar episode will consider referral to [facility]. #2. Right upper quadrant and right low quadrant abdominal pain. She has constant unrelenting pain which is most likely wall pain. Remain to be seen if some of her pain is biliary colic. She is already on pain medication and she will need help from her pain specialist to manage her abdominal as well as back pain. #3. Nausea and vomiting may be related to her medications biliary tract disease and also dietary habits. She underwent EGD 4 weeks ago and there was question of gastric remnant portion but this could not be confirmed on upper GI series.   Plan:  New prescription for promethazine given. Patient advised to eat multiple small meals. LFTs in 2 weeks when she has change in right upper quadrant abdominal pain. Pain management per her pain specialist. Office visit in 2 months.

## 2017-01-04 NOTE — Patient Instructions (Signed)
LFTs in 2 weeks. Call if pain different and more intense in which case we'll do LFTs within 6-12 hours of onset of pain.

## 2017-01-04 NOTE — Telephone Encounter (Signed)
Patient called, she wanted to know if Dr. Laural Golden had been able to get in touch with the pain management clinic.  I told her no, that he has been in clinic and is running behind.  She wanted me to let him know that she was in a lot of pain this afternoon.  [phone]

## 2017-01-05 NOTE — Telephone Encounter (Signed)
She has chronic pain and she has pain medications. Pain has to be managed by her pain clinic. Will call her pain health provider when I get a chance.

## 2017-01-05 NOTE — Telephone Encounter (Signed)
Forwarded to Dr.Rehman for review. 

## 2017-01-06 DIAGNOSIS — R110 Nausea: Secondary | ICD-10-CM | POA: Diagnosis not present

## 2017-01-06 DIAGNOSIS — D508 Other iron deficiency anemias: Secondary | ICD-10-CM | POA: Diagnosis not present

## 2017-01-06 DIAGNOSIS — R7989 Other specified abnormal findings of blood chemistry: Secondary | ICD-10-CM | POA: Diagnosis not present

## 2017-01-06 DIAGNOSIS — R1011 Right upper quadrant pain: Secondary | ICD-10-CM | POA: Diagnosis not present

## 2017-01-06 DIAGNOSIS — Z79899 Other long term (current) drug therapy: Secondary | ICD-10-CM | POA: Diagnosis not present

## 2017-01-10 ENCOUNTER — Encounter: Payer: Self-pay | Admitting: Adult Health

## 2017-01-10 ENCOUNTER — Ambulatory Visit (INDEPENDENT_AMBULATORY_CARE_PROVIDER_SITE_OTHER): Payer: Medicaid Other | Admitting: Adult Health

## 2017-01-10 VITALS — BP 98/62 | HR 76 | Ht 66.0 in | Wt 214.5 lb

## 2017-01-10 DIAGNOSIS — R102 Pelvic and perineal pain: Secondary | ICD-10-CM | POA: Diagnosis not present

## 2017-01-10 DIAGNOSIS — N938 Other specified abnormal uterine and vaginal bleeding: Secondary | ICD-10-CM | POA: Diagnosis not present

## 2017-01-10 DIAGNOSIS — D500 Iron deficiency anemia secondary to blood loss (chronic): Secondary | ICD-10-CM | POA: Diagnosis not present

## 2017-01-10 DIAGNOSIS — N921 Excessive and frequent menstruation with irregular cycle: Secondary | ICD-10-CM

## 2017-01-10 DIAGNOSIS — N939 Abnormal uterine and vaginal bleeding, unspecified: Secondary | ICD-10-CM

## 2017-01-10 LAB — POCT HEMOGLOBIN: Hemoglobin: 12.2 g/dL (ref 12.2–16.2)

## 2017-01-10 MED ORDER — CYCLOBENZAPRINE HCL 10 MG PO TABS: 10.0000 mg | ORAL_TABLET | Freq: Three times a day (TID) | ORAL | 1 refills | Status: DC | PRN

## 2017-01-10 NOTE — Progress Notes (Signed)
Subjective:     Patient ID: Penny [name], female   DOB: Mar 08, 1986, 31 y.o.   MRN: [id number]  HPI Penny [name] is a 31 year old white female, separated in complaining of heavy periods with pelvic pain.She was in [place] for 6 days the of December then [facility] for 3 days, had endoscopy by Dr Oneida Alar and 3 units of blood and iron transfusion x 2.She has dizziness at times and blurred vision and limbs numb, she can't eat.She is sp gastric by pass.She says she has also gotten shots for hept, A and B. PCP is [facility].   Review of Systems +heavy bleeding  +pelvic pain Dizzy at times See HPI for other postives Reviewed past medical,surgical, social and family history. Reviewed medications and allergies.  Active Ambulatory Problems    Diagnosis Date Noted  . OBESITY, UNSPECIFIED 09/17/2010  . Anemia, iron deficiency 07/23/2010  . Transaminitis 09/17/2010  . Diarrhea 05/27/2011  . IBS (irritable bowel syndrome) 08/25/2011  . History of migraine headaches 03/10/2012  . Depression with anxiety 03/10/2012  . 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 03/10/2012  . 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 h/o Opiate addiction 03/11/2012  . Menorrhagia 07/18/2013  . Chronic interstitial cystitis 10/23/2013  . Maternal iron deficiency anemia complicating pregnancy [id number]  . Restless legs 01/16/2014  . Rapid palpitations 02/18/2014  . H/O maternal third degree perineal laceration, currently pregnant 02/18/2014  . Depression complicating pregnancy in second trimester, antepartum 05/09/2014  . 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 Previous gastric bypass affecting pregnancy, antepartum 05/22/2014  . Right leg numbness 07/31/2014  . Encephalopathy 08/13/2014  . Headache 08/13/2014  . Altered mental status 08/13/2014  . Abdominal pain, acute, right lower quadrant 08/22/2014  . 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 Sciatica of  left side 11/14/2014  . Cervicalgia 12/18/2014  . Leg weakness, bilateral 01/02/2015  . Gait disorder 01/02/2015  . 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 Current smoker 01/29/2015  . Seizure ([place]) 08/18/2014  . Headache, migraine 08/19/2014  . Pelvic congestion syndrome 10/13/2011  . Coitalgia 10/13/2011  . Chronic migraine without aura 10/13/2011  . Fibromyalgia 02/18/2015  . Vulvar fissure 02/18/2015  . Pseudoseizure ([place]) 02/22/2015  . Seizure-like activity ([place]) 02/22/2015  . Hematemesis 04/07/2015  . Intractable vomiting with nausea 04/07/2015  . Elevated liver enzymes   . Epigastric pain   . Dysphagia   . Anastomotic ulcer   . Post partum depression 05/09/2015  . 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 Bilateral leg numbness 11/26/2015  . Paresthesias   . Hypertension in pregnancy, transient 07/08/2014  . Current tobacco use 01/29/2015  . High-risk pregnancy 02/18/2014  . Other symptoms and signs involving the musculoskeletal system 01/02/2015  . Panic disorder without agoraphobia 03/10/2012  . H/O disease 03/10/2012  . Supervision of pregnancy with other poor reproductive or obstetric history, unspecified trimester 02/18/2014  . Restless leg 01/16/2014  . Neuralgia neuritis, sciatic nerve 11/14/2014  . Abnormal gait 01/02/2015  . Family planning 09/09/2014  . Clinical depression 02/01/2015  . Disturbance of skin sensation 07/31/2014  . Unspecified dyspareunia 10/13/2011  . Dysthymia 03/10/2012  . Excessive and frequent menstruation 07/18/2013  . ASD (atrial septal defect), ostium secundum 05/17/2014  . Antepartum mental disorder in pregnancy 05/09/2014  . Neuropathy ([place]) 01/23/2016  . Inability to walk 01/23/2016  . Uncontrolled pain 01/24/2016  . Malnutrition of moderate degree 01/24/2016  .  Left-sided weakness   . Paresthesia   . Complaints of leg weakness   . Symptomatic anemia  12/03/2016  . Folate deficiency 12/04/2016  . Nausea vomiting and diarrhea 12/04/2016  . Severe anemia 12/04/2016  . Chronic pain syndrome 12/05/2016   Resolved Ambulatory Problems    Diagnosis Date Noted  . Passive suicidal ideations 03/10/2012  . Epigastric pain 06/06/2012  . Vomiting 06/06/2012  . RLQ abdominal pain 07/18/2013  . Dysmenorrhea 09/18/2013  . Stress 09/18/2013  . Patient desires pregnancy 09/18/2013  . Symptomatic anemia 01/20/2014  . Supervision of other high-risk pregnancy(V23.89) 02/18/2014  . History of gestational hypertension 02/18/2014  . Nuchal fold thickening on prental ultrasound 03/09/2014  . Anemia 05/09/2014  . Gestational hypertension 07/08/2014  . Hypertension affecting pregnancy 07/09/2014  . Gestational hypertension w/o significant proteinuria in 3rd trimester 07/12/2014  . Preeclampsia 07/27/2014  . Laceration of vaginal wall or sulcus without perineal laceration during delivery 08/05/2014  . Contraception management 09/09/2014  . Abdominal pain   . Awareness of heartbeats 02/18/2014  . Polypharmacy 02/01/2015  . Adverse drug effect 02/01/2015  . Bariatric surgery status complicating pregnancy [id number]  . Opioid dependence ([place]) 03/11/2012  . Adiposity 09/17/2010   Past Medical History:  Diagnosis Date  . ADD (attention deficit disorder)   . Anemia 2011  . Anginal pain ([place])   . Anxiety   . Blood transfusion without reported diagnosis   . Chronic daily headache   . Depression   . Depression   . Dysmenorrhea 09/18/2013  . Dysrhythmia   . Elevated liver enzymes JUL 2011ALK PHOS 111-127 AST  143-267 ALT  213-321T BILI 0.6  ALB  3.7-4.1   . Encounter for drug rehabilitation   . Family history of adverse reaction to anesthesia   . Fatty liver   . Fibromyalgia   . Gastric bypass status for obesity   . Gastritis JULY 2011  . Heart murmur   . 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 History of Holter monitoring    . Hx of opioid abuse   . Interstitial cystitis   . Iron deficiency anemia 07/23/2010  . Irritable bowel syndrome 2012 DIARRHEA  . IUD FEB 2010  . Lupus   . Menorrhagia 07/18/2013  . Migraines   . Obesity (BMI 30-39.9) 2011 228 LBS BMI 36.8  . Ovarian cyst   . Patient desires pregnancy 09/18/2013  . Polyneuropathy ([place])   . PONV (postoperative nausea and vomiting) seizure post-operatively  . Potassium (K) deficiency   . Pregnant 12/25/2013  . Psychiatric pseudoseizure   . RLQ abdominal pain 07/18/2013  . Sciatica of left side 11/14/2014  . Seizures ([place]) 07/31/2014  . Stress 09/18/2013      Objective:   Physical Exam BP 98/62 (BP Location: Left Arm, Patient Position: Sitting, Cuff Size: Large)   Pulse 76   Ht 5\' 6"  (1.676 m)   Wt 214 lb 8 oz (97.3 kg)   LMP 01/08/2017   BMI 34.62 kg/m HGB 12.2 Skin warm and dry.Pelvic: external genitalia is normal in appearance no lesions, vagina has period like blood,urethra has no lesions or masses noted, cervix:smooth and bulbous, uterus: normal size, shape and contour,  tender, no masses felt, adnexa: no masses, bilateral  tenderness noted. Bladder is non tender and no masses felt.    PHQ 9 score 10,pt sees therapist and is on Cymbalta and klonopin, denies any suicidal ideations. Will get [name] to assess uterus.Discussed options such as megace or  Ablation, and  Hysterectomy(as last result). Face time 25 minutes with 50 % counseling and listening to her last hospital events.   Assessment:     1. Abnormal uterine bleeding (AUB)   2. Menorrhagia with irregular cycle   3. Vaginal bleeding   4. Iron deficiency anemia due to chronic blood loss   5. Pelvic pain       Plan:     Rx flexeril 10 mg #30 take 1 every 8 hours prn with 1 rerill Return in 2 days for GYN [name] Review handout on pelvic pain and menorrhagia

## 2017-01-10 NOTE — Patient Instructions (Signed)
Dysfunctional Uterine Bleeding Introduction Dysfunctional uterine bleeding is abnormal bleeding from the uterus. Dysfunctional uterine bleeding includes:  A period that comes earlier or later than usual.  A period that is lighter, heavier, or has blood clots.  Bleeding between periods.  Skipping one or more periods.  Bleeding after sexual intercourse.  Bleeding after menopause. Follow these instructions at home: Pay attention to any changes in your symptoms. Follow these instructions to help with your condition: Eating and drinking  Eat well-balanced meals. Include foods that are high in iron, such as liver, meat, shellfish, green leafy vegetables, and eggs.  If you become constipated:  Drink plenty of water.  Eat fruits and vegetables that are high in water and fiber, such as spinach, carrots, raspberries, apples, and mango. Medicines  Take over-the-counter and prescription medicines only as told by your health care provider.  Do not change medicines without talking with your health care provider.  Aspirin or medicines that contain aspirin may make the bleeding worse. Do not take those medicines:  During the week before your period.  During your period.  If you were prescribed iron pills, take them as told by your health care provider. Iron pills help to replace iron that your body loses because of this condition. Activity  If you need to change your sanitary pad or tampon more than one time every 2 hours:  Lie in bed with your feet raised (elevated).  Place a cold pack on your lower abdomen.  Rest as much as possible until the bleeding stops or slows down.  Do not try to lose weight until the bleeding has stopped and your blood iron level is back to normal. Other Instructions  For two months, write down:  When your period starts.  When your period ends.  When any abnormal bleeding occurs.  What problems you notice.  Keep all follow up visits as told by  your health care provider. This is important. Contact a health care provider if:  You get light-headed or weak.  You have nausea and vomiting.  You cannot eat or drink without vomiting.  You feel dizzy or have diarrhea while you are taking medicines.  You are taking birth control pills or hormones, and you want to change them or stop taking them. Get help right away if:  You develop a fever or chills.  You need to change your sanitary pad or tampon more than one time per hour.  Your bleeding becomes heavier, or your flow contains clots more often.  You develop pain in your abdomen.  You lose consciousness.  You develop a rash. This information is not intended to replace advice given to you by your health care provider. Make sure you discuss any questions you have with your health care provider. Document Released: 11/19/2000 Document Revised: 04/29/2016 Document Reviewed: 02/17/2015  2017 Elsevier Pelvic Pain, Female Female pelvic pain can be caused by many different things and start from a variety of places. Pelvic pain refers to pain that is located in the lower half of the abdomen and between your hips. The pain may occur over a short period of time (acute) or may be reoccurring (chronic). The cause of pelvic pain may be related to disorders affecting the female reproductive organs (gynecologic), but it may also be related to the bladder, kidney stones, an intestinal complication, or muscle or skeletal problems. Getting help right away for pelvic pain is important, especially if there has been severe, sharp, or a sudden onset of unusual  pain. It is also important to get help right away because some types of pelvic pain can be life threatening.  CAUSES  Below are only some of the causes of pelvic pain. The causes of pelvic pain can be in one of several categories.   Gynecologic.  Pelvic inflammatory disease.  Sexually transmitted infection.  Ovarian cyst or a twisted ovarian  ligament (ovarian torsion).  Uterine lining that grows outside the uterus (endometriosis).  Fibroids, cysts, or tumors.  Ovulation.  Pregnancy.  Pregnancy that occurs outside the uterus (ectopic pregnancy).  Miscarriage.  Labor.  Abruption of the placenta or ruptured uterus.  Infection.  Uterine infection (endometritis).  Bladder infection.  Diverticulitis.  Miscarriage related to a uterine infection (septic abortion).  Bladder.  Inflammation of the bladder (cystitis).  Kidney stone(s).  Gastrointestinal.  Constipation.  Diverticulitis.  Neurologic.  Trauma.  Feeling pelvic pain because of mental or emotional causes (psychosomatic).  Cancers of the bowel or pelvis. EVALUATION  Your caregiver will want to take a careful history of your concerns. This includes recent changes in your health, a careful gynecologic history of your periods (menses), and a sexual history. Obtaining your family history and medical history is also important. Your caregiver may suggest a pelvic exam. A pelvic exam will help identify the location and severity of the pain. It also helps in the evaluation of which organ system may be involved. In order to identify the cause of the pelvic pain and be properly treated, your caregiver may order tests. These tests may include:   A pregnancy test.  Pelvic ultrasonography.  An X-ray exam of the abdomen.  A urinalysis or evaluation of vaginal discharge.  Blood tests. HOME CARE INSTRUCTIONS   Only take over-the-counter or prescription medicines for pain, discomfort, or fever as directed by your caregiver.   Rest as directed by your caregiver.   Eat a balanced diet.   Drink enough fluids to make your urine clear or pale yellow, or as directed.   Avoid sexual intercourse if it causes pain.   Apply warm or cold compresses to the lower abdomen depending on which one helps the pain.   Avoid stressful situations.   Keep a  journal of your pelvic pain. Write down when it started, where the pain is located, and if there are things that seem to be associated with the pain, such as food or your menstrual cycle.  Follow up with your caregiver as directed.  SEEK MEDICAL CARE IF:  Your medicine does not help your pain.  You have abnormal vaginal discharge. SEEK IMMEDIATE MEDICAL CARE IF:   You have heavy bleeding from the vagina.   Your pelvic pain increases.   You feel light-headed or faint.   You have chills.   You have pain with urination or blood in your urine.   You have uncontrolled diarrhea or vomiting.   You have a fever or persistent symptoms for more than 3 days.  You have a fever and your symptoms suddenly get worse.   You are being physically or sexually abused. This information is not intended to replace advice given to you by your health care provider. Make sure you discuss any questions you have with your health care provider. Document Released: 10/19/2004 Document Revised: 08/13/2015 Document Reviewed: 09/12/2015 Elsevier Interactive Patient Education  2017 [employer].

## 2017-01-12 ENCOUNTER — Other Ambulatory Visit: Payer: Medicaid Other

## 2017-01-12 NOTE — Telephone Encounter (Signed)
Talked with the patient. She states that she has an appointment tomorrow with pain management in [facility]. Also, she is to have U/S tomorrow,01/13/2017 , for [facility] . This is looking at her ovaries. She states that she is having a heavy period , pain . They will be deciding if she needs Ablation , or Hysterectomy. She was advised that whom ever she saw at the pain management would request what they needed from our office.

## 2017-01-13 ENCOUNTER — Other Ambulatory Visit: Payer: Medicaid Other

## 2017-01-17 ENCOUNTER — Ambulatory Visit (INDEPENDENT_AMBULATORY_CARE_PROVIDER_SITE_OTHER): Payer: Medicaid Other

## 2017-01-17 DIAGNOSIS — N938 Other specified abnormal uterine and vaginal bleeding: Secondary | ICD-10-CM

## 2017-01-17 DIAGNOSIS — N921 Excessive and frequent menstruation with irregular cycle: Secondary | ICD-10-CM

## 2017-01-17 DIAGNOSIS — N854 Malposition of uterus: Secondary | ICD-10-CM | POA: Diagnosis not present

## 2017-01-17 DIAGNOSIS — N939 Abnormal uterine and vaginal bleeding, unspecified: Secondary | ICD-10-CM

## 2017-01-17 DIAGNOSIS — D251 Intramural leiomyoma of uterus: Secondary | ICD-10-CM | POA: Diagnosis not present

## 2017-01-17 DIAGNOSIS — R102 Pelvic and perineal pain: Secondary | ICD-10-CM | POA: Diagnosis not present

## 2017-01-17 NOTE — Progress Notes (Signed)
PELVIC US TA/TV: retroverted uterus w/a small fundal right intramural fibroid 1.3 x .8 x .9 cm,EEC 6.8 mm,normal ov's bilat,ovaries appear mobile,bilat adnexal discomfort during ultrasound,no free fluid

## 2017-01-18 ENCOUNTER — Telehealth: Payer: Self-pay | Admitting: Adult Health

## 2017-01-18 ENCOUNTER — Encounter: Payer: Self-pay | Admitting: Adult Health

## 2017-01-18 DIAGNOSIS — D259 Leiomyoma of uterus, unspecified: Secondary | ICD-10-CM

## 2017-01-18 DIAGNOSIS — D219 Benign neoplasm of connective and other soft tissue, unspecified: Secondary | ICD-10-CM

## 2017-01-18 HISTORY — DX: Benign neoplasm of connective and other soft tissue, unspecified: D21.9

## 2017-01-18 NOTE — Telephone Encounter (Signed)
Pt aware US showed normal ovaries and small fundal fibroid, make appt with MD to talk options for bleeding and pain

## 2017-01-21 ENCOUNTER — Telehealth: Payer: Self-pay | Admitting: Adult Health

## 2017-01-21 NOTE — Telephone Encounter (Signed)
Pt forgot we talked 2/13, she has appt 2/22 at 10:15 with Dr Glo Herring

## 2017-01-23 IMAGING — CT CT ABD-PELV W/ CM
2 of 4 series · 15 of 46 positions shown, 17 images · IV contrast (Isovue)
Comparison: 06/15/2016

CLINICAL DATA: Oral contrast per protocol; RLQ pain; Pt sent from
Dr [REDACTED], pale ,weak, Hemoglobin 7.3, vomiting x 1 weeks,
diarrhea, vaginal bleeding x 2 weeks^100mL 9F7ZLK-GHH IOPAMIDOL
(9F7ZLK-GHH) INJECTION 61%

EXAM:
CT ABDOMEN AND PELVIS WITH CONTRAST
TECHNIQUE: Multidetector CT imaging of the abdomen and pelvis was performed
using the standard protocol following bolus administration of
intravenous contrast.
CONTRAST:  100mL [id number] IOPAMIDOL (9F7ZLK-GHH) INJECTION 61%

[Series 2: axial st · axial · 0.98mm/px · z∈[-455,+20]mm · 12 of 105 slices shown, 14 images]
[im 5/105  soft-tissue]
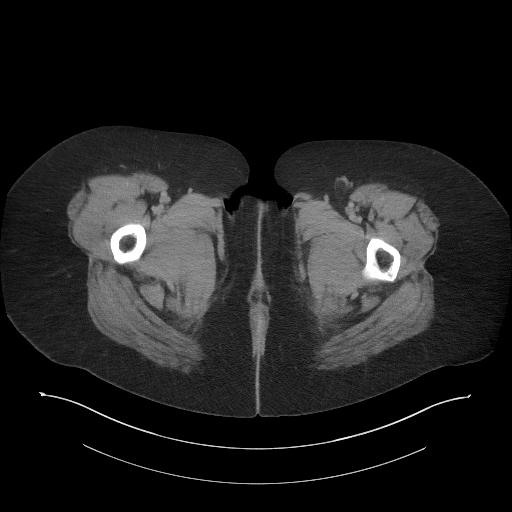
[im 5/105  bone]
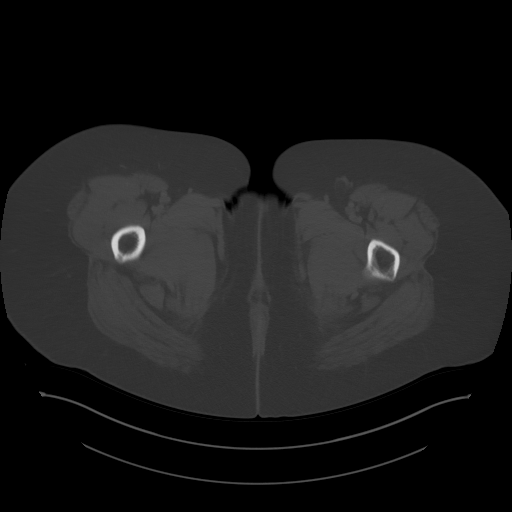
[im 14/105  soft-tissue]
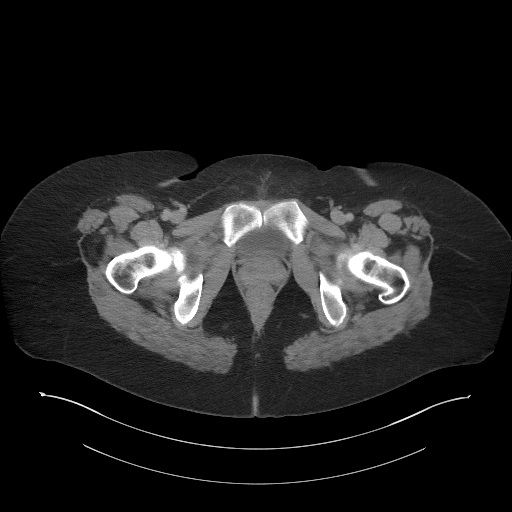
[im 22/105  soft-tissue]
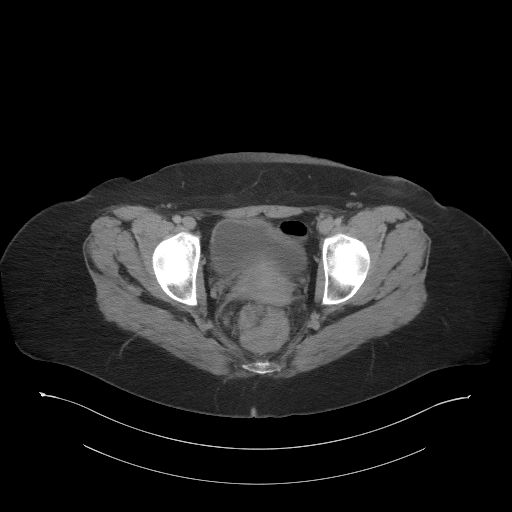
[im 31/105  soft-tissue]
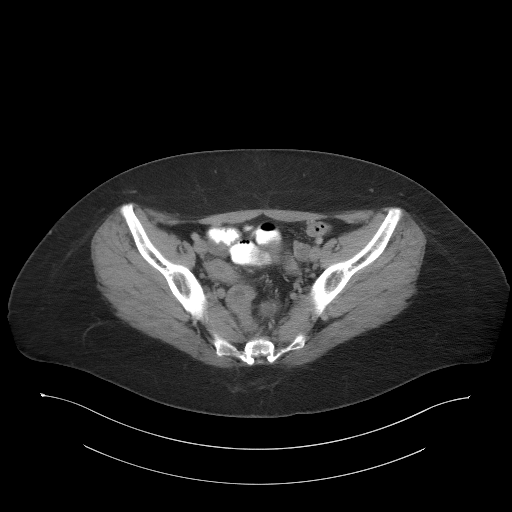
[im 40/105  soft-tissue]
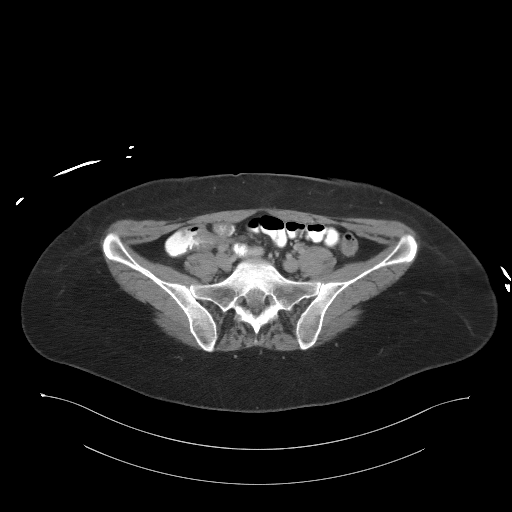
[im 48/105  soft-tissue]
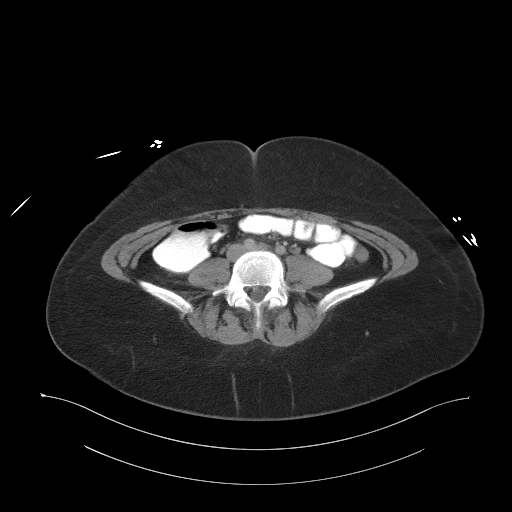
[im 57/105  soft-tissue]
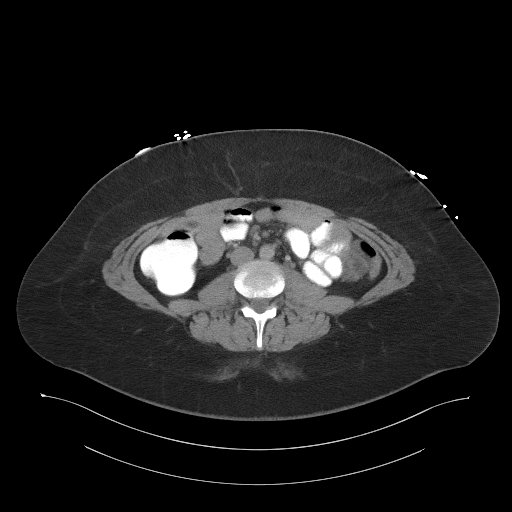
[im 66/105  soft-tissue]
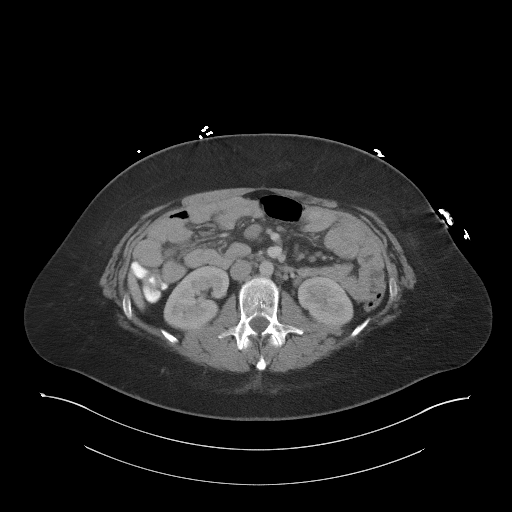
[im 74/105  soft-tissue]
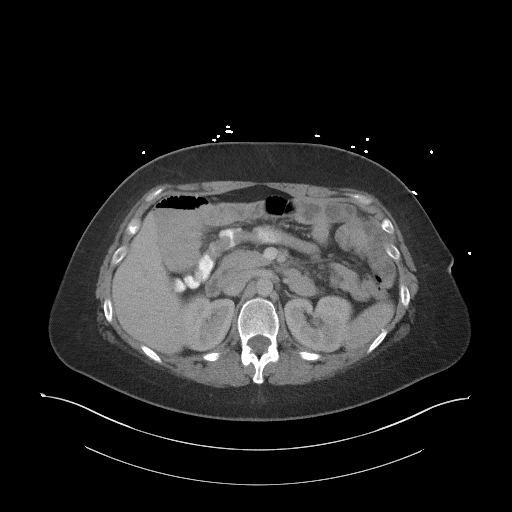
[im 74/105  bone]
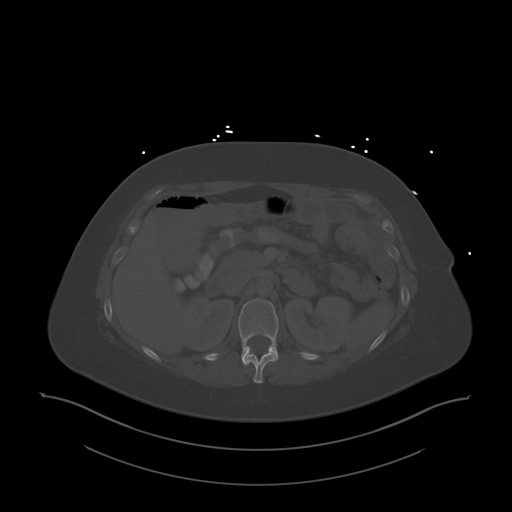
[im 83/105  soft-tissue]
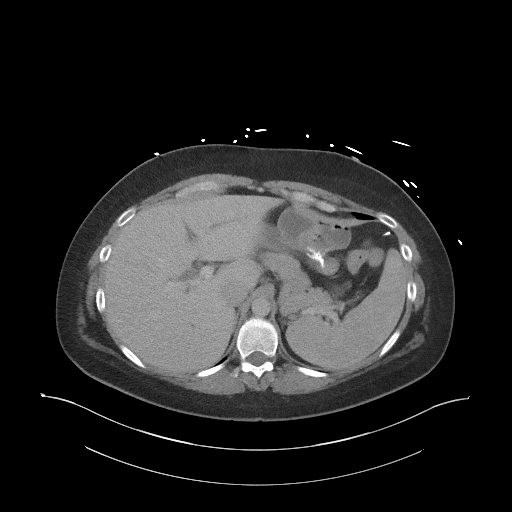
[im 92/105  soft-tissue]
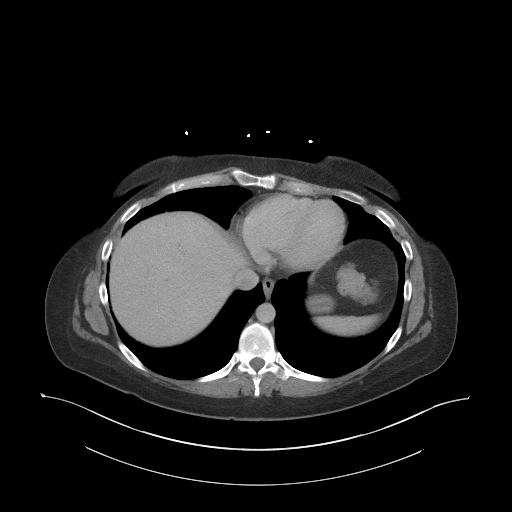
[im 100/105  soft-tissue]
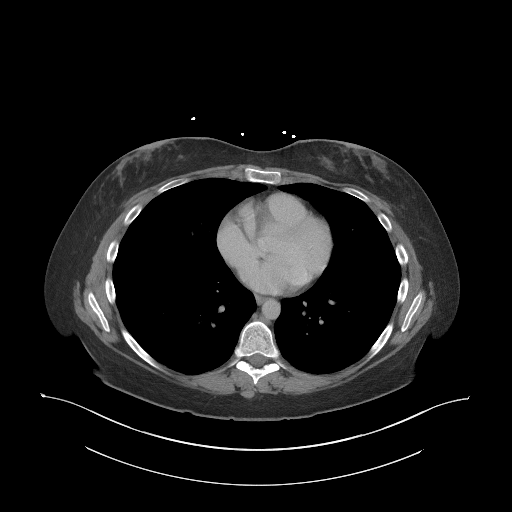

[Series 4: coronal st · coronal · 0.83mm/px · 3 of 95 slices shown]
[im 32/95  soft-tissue]
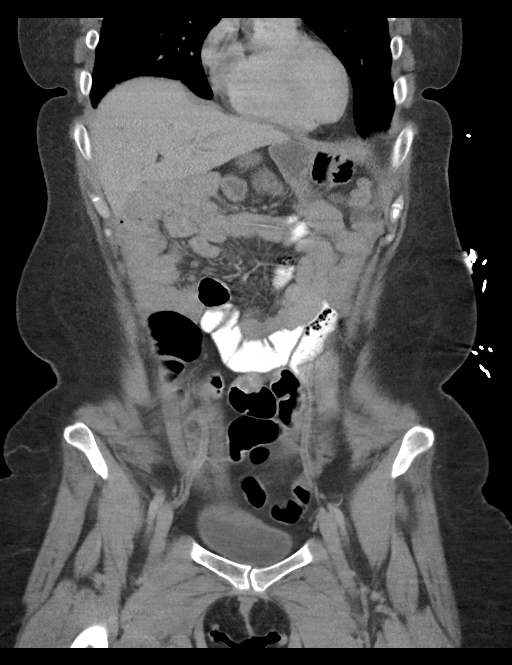
[im 42/95  soft-tissue]
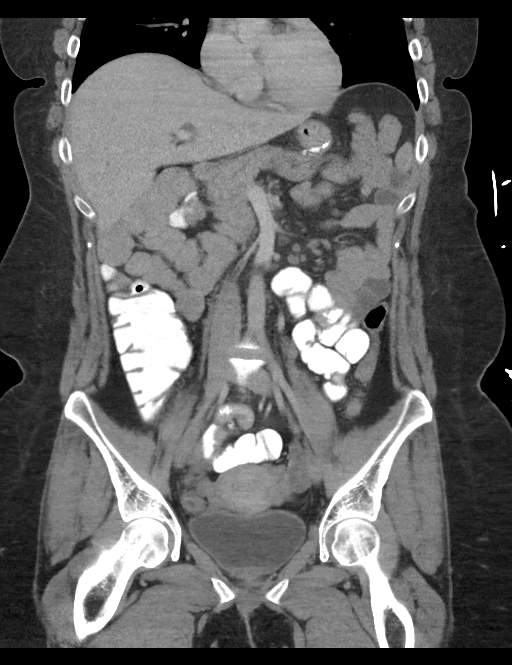
[im 53/95  soft-tissue]
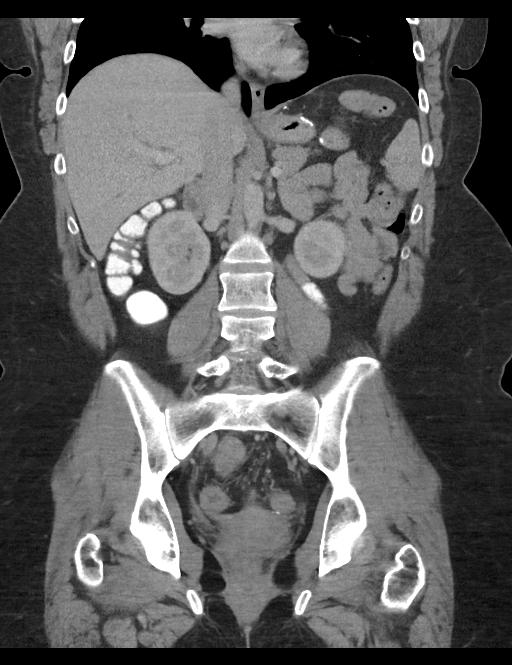

[15 of 46 positions shown; findings below may reference images not displayed]

FINDINGS: Lower chest: No pulmonary nodules, pleural effusions, or
infiltrates. Heart size is normal. No imaged pericardial effusion or
significant coronary artery calcifications.

Hepatobiliary: Status post cholecystectomy. Mild intrahepatic
biliary duct dilatation. Common bile duct is prominent, measuring
1.3 cm. Dilatation is increased since prior study on 04/07/2015.

Pancreas: Unremarkable. No pancreatic ductal dilatation or
surrounding inflammatory changes.

Spleen: Normal in size without focal abnormality.

Adrenals/Urinary Tract: Adrenal glands are unremarkable. Kidneys are
normal, without renal calculi, focal lesion, or hydronephrosis.
Bladder is unremarkable.

Stomach/Bowel: Status post gastric bypass surgery, unremarkable in
appearance. Small bowel loops are unremarkable in appearance. The
appendix is well seen and has a normal appearance. Early contrast
filling of the large bowel limits evaluation of the colon. However,
no focal abnormality identified.

Vascular/Lymphatic: No significant vascular findings are present.
Numerous small, nonspecific mesenteric lymph nodes are present.

Reproductive: The uterus is present.  No adnexal mass.

Other: No abdominal wall hernia or abnormality. No abdominopelvic
ascites.

Musculoskeletal: No acute or significant osseous findings.
IMPRESSION: 1. Status post cholecystectomy.
2. Mild intra and extrahepatic biliary duct dilatation may be
explained by the patient's postoperative state but is more prominent
than typical and has increased. Correlation with hepatic enzymes
recommended. Consider MRCP for further evaluation.
3. Uncomplicated appearance of gastric bypass surgery.
4. Small nonspecific mesenteric lymph nodes.

## 2017-01-24 ENCOUNTER — Other Ambulatory Visit: Payer: Self-pay | Admitting: Adult Health

## 2017-01-26 ENCOUNTER — Ambulatory Visit (HOSPITAL_COMMUNITY): Payer: Self-pay | Admitting: Psychiatry

## 2017-01-27 ENCOUNTER — Encounter: Payer: Self-pay | Admitting: Obstetrics and Gynecology

## 2017-01-27 ENCOUNTER — Ambulatory Visit (INDEPENDENT_AMBULATORY_CARE_PROVIDER_SITE_OTHER): Payer: Medicaid Other | Admitting: Obstetrics and Gynecology

## 2017-01-27 VITALS — BP 110/80 | HR 80 | Ht 62.0 in | Wt 219.6 lb

## 2017-01-27 DIAGNOSIS — Z1389 Encounter for screening for other disorder: Secondary | ICD-10-CM | POA: Diagnosis not present

## 2017-01-27 DIAGNOSIS — Z3009 Encounter for other general counseling and advice on contraception: Secondary | ICD-10-CM

## 2017-01-27 DIAGNOSIS — N920 Excessive and frequent menstruation with regular cycle: Secondary | ICD-10-CM

## 2017-01-27 DIAGNOSIS — N946 Dysmenorrhea, unspecified: Secondary | ICD-10-CM

## 2017-01-27 DIAGNOSIS — Z87440 Personal history of urinary (tract) infections: Secondary | ICD-10-CM

## 2017-01-27 LAB — POCT URINALYSIS DIPSTICK
Glucose, UA: NEGATIVE
Ketones, UA: NEGATIVE
Leukocytes, UA: NEGATIVE
NITRITE UA: NEGATIVE
Protein, UA: NEGATIVE
RBC UA: NEGATIVE

## 2017-01-27 IMAGING — RF DG UGI W/ HIGH DENSITY W/KUB
10 of 12 series · 14 of 18 positions shown · non-contrast
Comparison: CT abdomen 12/03/2016

CLINICAL DATA: Nausea vomiting. History of gastric bypass.
Endoscopy showed possible gastric torsion.

EXAM:
UPPER GI SERIES WITH KUB
TECHNIQUE: After obtaining a scout radiograph a routine upper GI series was
performed using thin barium
FLUOROSCOPY TIME:  Fluoroscopy Time:  1 minutes 42 second
Radiation Exposure Index (if provided by the fluoroscopic device):
Number of Acquired Spot Images: 0

[Series 1: t abdomen supine · 0.15mm/px · 1 of 1 slices shown (1 of 2)]
[im 1/1]
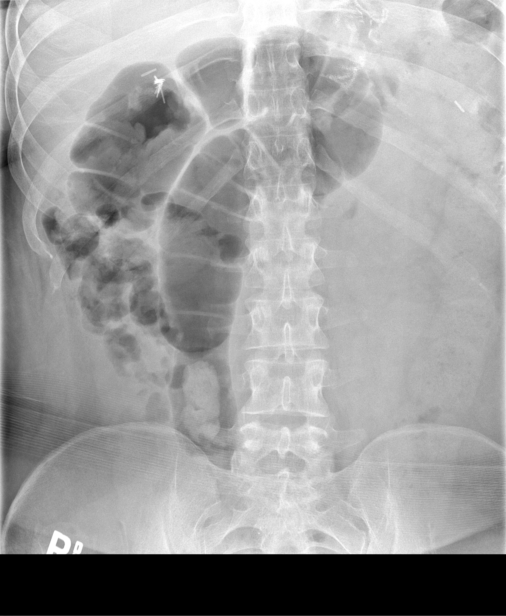

[Series 2: t abdomen supine · 0.15mm/px · 1 of 1 slices shown (2 of 2)]
[im 1/1]
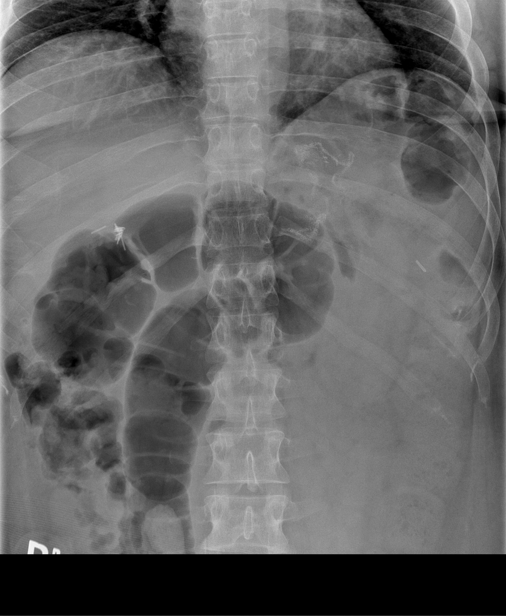

[Series 3: cp_standard · 0.19mm/px · 3 of 85 frames shown (1 of 2)]
[frame 43/85]
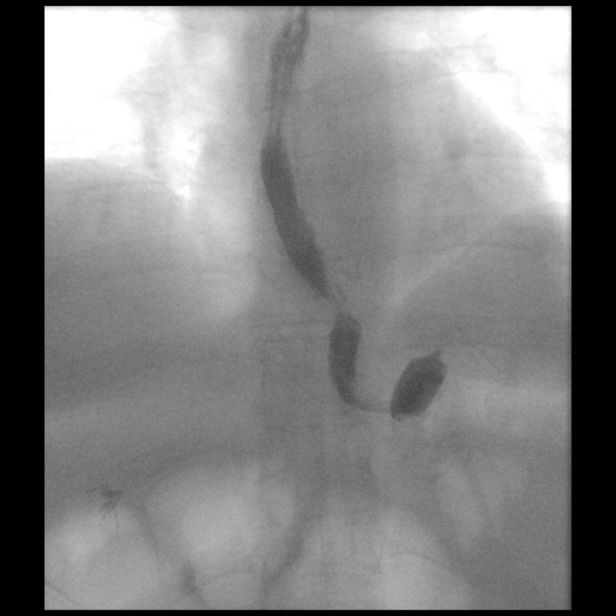
[frame 62/85]
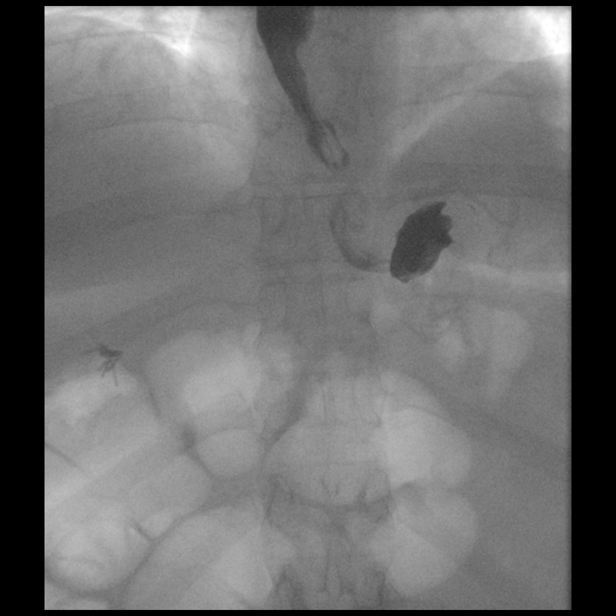
[frame 73/85]
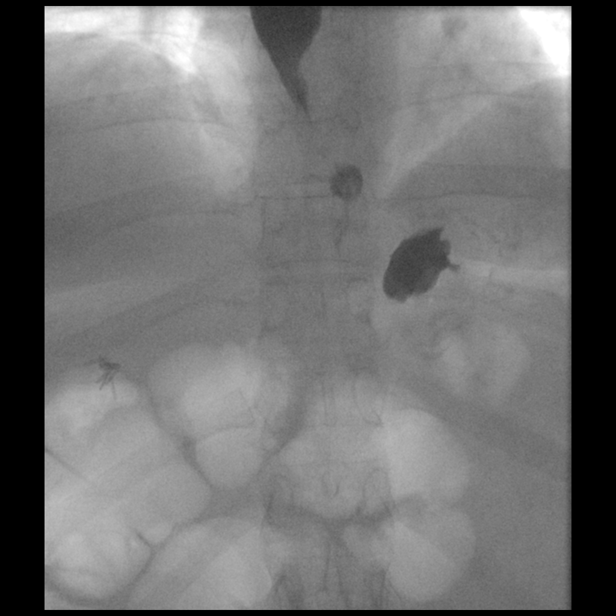

[Series 5: fluoro_barium singleshot_bw · 0.19mm/px · 1 of 1 slices shown (1 of 6)]
[im 1/1]
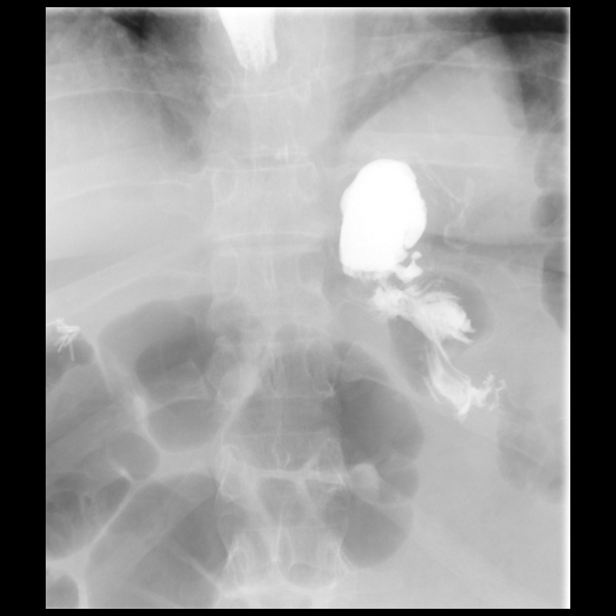

[Series 6: cp_standard · 0.19mm/px · 3 of 64 frames shown (2 of 2)]
[frame 10/64]
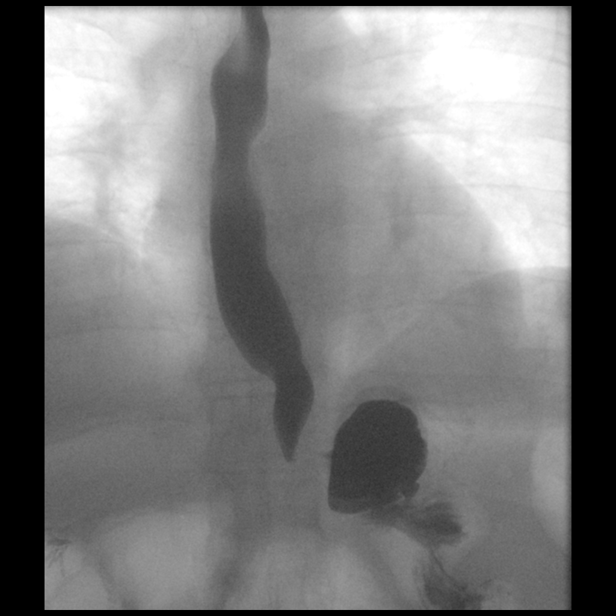
[frame 33/64]
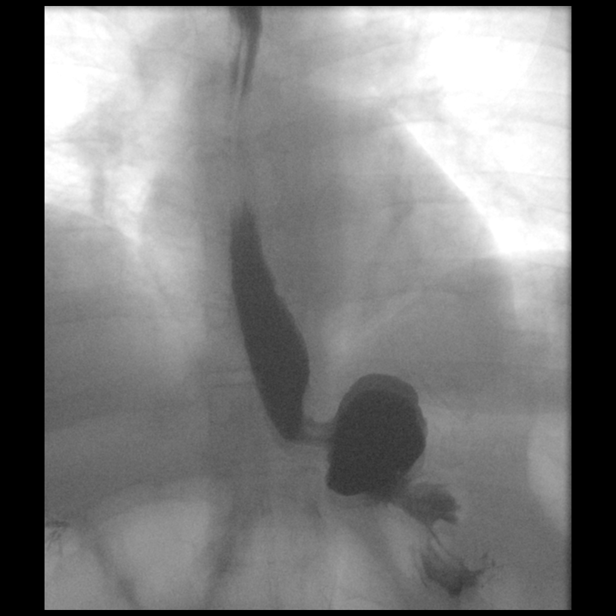
[frame 55/64]
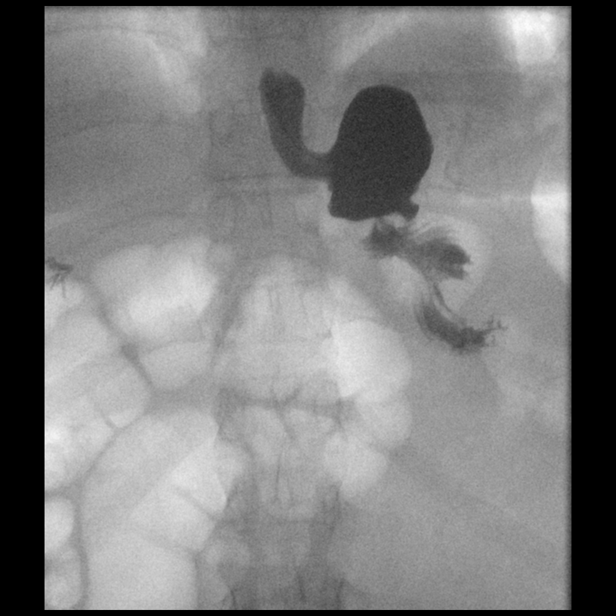

[Series 7: fluoro_barium singleshot_bw · 0.19mm/px · 1 of 1 slices shown (2 of 6)]
[im 1/1]
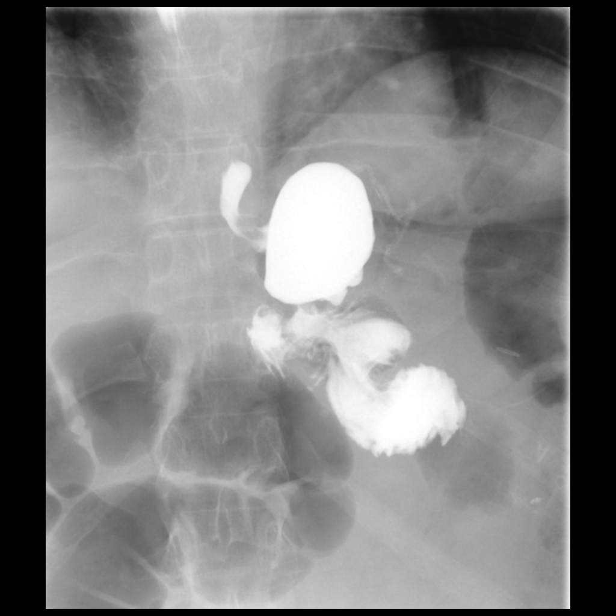

[Series 8: fluoro_barium singleshot_bw · 0.19mm/px · 1 of 1 slices shown (3 of 6)]
[im 1/1]
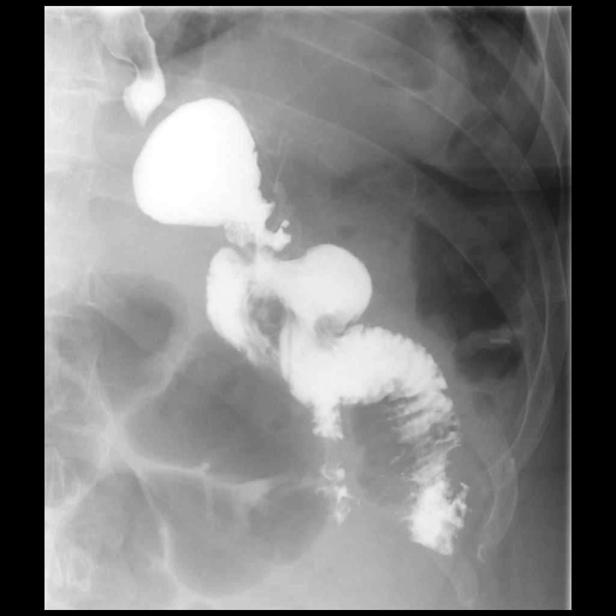

[Series 9: fluoro_barium singleshot_bw · 0.19mm/px · 1 of 1 slices shown (4 of 6)]
[im 1/1]
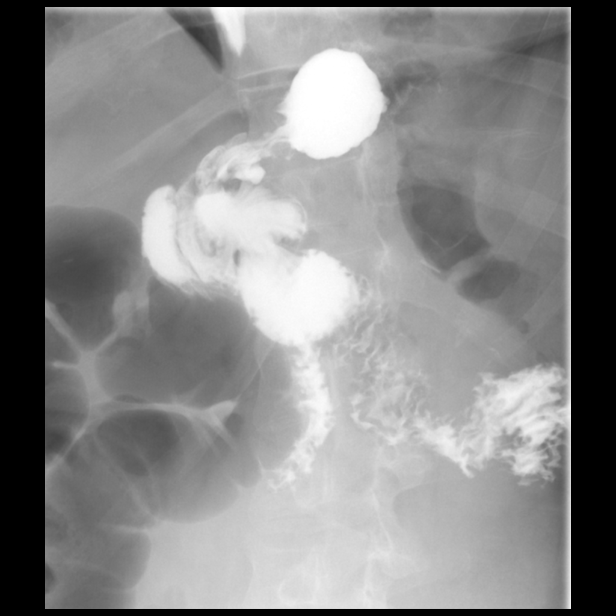

[Series 11: fluoro_barium singleshot_bw · 0.19mm/px · 1 of 1 slices shown (5 of 6)]
[im 1/1]
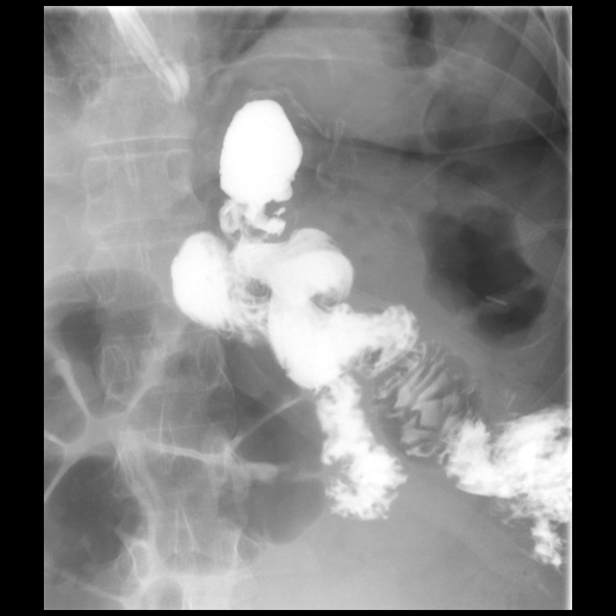

[Series 12: fluoro_barium singleshot_bw · 0.19mm/px · 1 of 1 slices shown (6 of 6)]
[im 1/1]
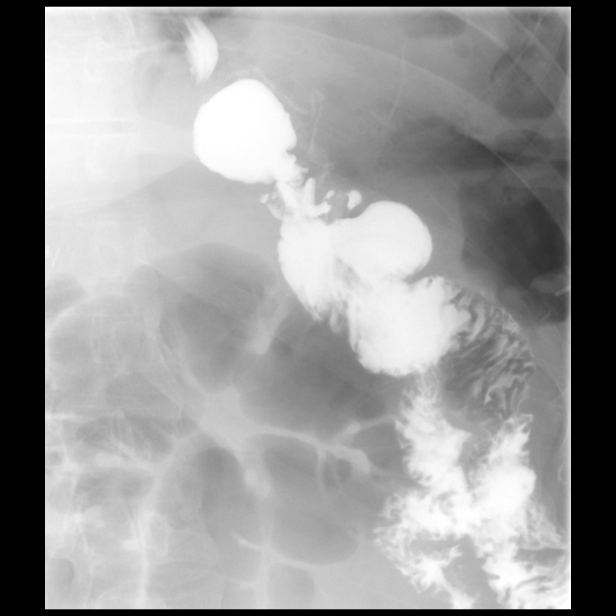

[14 of 18 positions shown; findings below may reference images not displayed]

FINDINGS: Preliminary KUB demonstrates gas in the right and transverse colon.
Nonobstructive bowel gas pattern. Surgical clips related to gastric
bypass surgery and cholecystectomy.

The patient was very weak and was not able to stand for the study.
The study was performed in the supine position while drinking
through a straw. Patient was not able to drink the usual amount of
barium.

The esophageal mucosa and motility are normal. The gastric remnant
has a normal location and morphology without edema. The gastric
remnant emptied normally and rapidly into the jejunum. The small
bowel is opacified and nondilated and in normal position. No ulcer
or mass lesion identified.
IMPRESSION: This study is limited by patient weakness and inability to drink
usual amount of barium.

Satisfactory postop gastric bypass surgery appearance.

## 2017-01-27 NOTE — Progress Notes (Addendum)
[facility] Visit  01/27/17          Patient name: Penny [name] MRN [id number]  Date of birth: [birth year]  CC & HPI:  Penny [name] is a 31 y.o. female presenting today for follow up in regards to [name] results. Pt reports associated heavy menorrhagia and pelvic pain. Pt voices that she would like to no longer have menstrual cycles due to having to obtain monthly IRON transfusions. Pt notes that she has had a gastric sleeve in the past. Pt hasn't tried any medications for the relief of her symptoms. Pt denies any other symptoms.    ROS:  ROS +Menorrhagia +pelvic pain Pt is so focused on prior medical conditions. Theres a PTSD effect to her behavior. Pertinent History Reviewed:   Reviewed: Significant for dysmenorrhea Medical         Past Medical History:  Diagnosis Date   ADD (attention deficit disorder)    Anemia 2011   2o to GASTRIC BYPASS   Anginal pain ([place])    Anxiety    Blood transfusion without reported diagnosis    Chronic daily headache    Depression    Depression    Dysmenorrhea 09/18/2013   Dysrhythmia    Elevated liver enzymes JUL 2011ALK PHOS 111-127 AST  143-267 ALT  213-321T BILI 0.6  ALB  3.7-4.06 Jun 2011 ALK PHOS 118 AST 24 ALT 42 T BILI 0.4 ALB 3.9   Encounter for drug rehabilitation    at behavioral health for opioid addiction about 4 years ago   Family history of adverse reaction to anesthesia    'dad had to be kept on pump for breathing for morphine'   Fatty liver    Fibroid 01/18/2017   Fibromyalgia    Gastric bypass status for obesity    Gastritis JULY 2011   Heart murmur    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   History of Holter monitoring    Hx of opioid abuse    for about 4 years, about 4 years ago   Interstitial cystitis    Iron deficiency anemia 07/23/2010   Irritable bowel syndrome 2012 DIARRHEA   JUN 2012 TTG IgA 14.9   IUD FEB 2010   Lupus    Menorrhagia 07/18/2013    Migraines    Obesity (BMI 30-39.9) 2011 228 LBS BMI 36.8   Ovarian cyst    Patient desires pregnancy 09/18/2013   Polyneuropathy ([place])    PONV (postoperative nausea and vomiting) seizure post-operatively   Potassium (K) deficiency    Pregnant 12/25/2013   Psychiatric pseudoseizure    RLQ abdominal pain 07/18/2013   Sciatica of left side 11/14/2014   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   Stress 09/18/2013                              Surgical Hx:    Past Surgical History:  Procedure Laterality Date   BIOPSY  04/10/2015   Procedure: BIOPSY;  Surgeon: Daneil Dolin, MD;  Location: [facility];  Service: Endoscopy;;   cath self every nite     for sodium bicarb injection (discontinued 2013)   CHOLECYSTECTOMY  2005   biliary dyskinesia   COLONOSCOPY  JUN 2012 ABD PN/DIARRHEA WITH PROPOFOL   NL COLON   DILATION AND CURETTAGE OF UTERUS     ESOPHAGEAL DILATION N/A 04/10/2015   Procedure: ESOPHAGEAL DILATION WITH 54FR MALONEY DILATOR;  Surgeon: Daneil Dolin, MD;  Location: [facility];  Service: Endoscopy;  Laterality: N/A;   ESOPHAGOGASTRODUODENOSCOPY     ESOPHAGOGASTRODUODENOSCOPY (EGD) WITH PROPOFOL N/A 04/10/2015   Procedure: ESOPHAGOGASTRODUODENOSCOPY (EGD) WITH PROPOFOL;  Surgeon: Daneil Dolin, MD;  Location: [facility];  Service: Endoscopy;  Laterality: N/A;   ESOPHAGOGASTRODUODENOSCOPY (EGD) WITH PROPOFOL N/A 12/06/2016   Procedure: ESOPHAGOGASTRODUODENOSCOPY (EGD) WITH PROPOFOL;  Surgeon: Danie Binder, MD;  Location: [facility];  Service: Endoscopy;  Laterality: N/A;   GAB  2007   in [place] 5 CM   GASTRIC BYPASS  06/2006   HYSTEROSCOPY W/D&C N/A 09/12/2014   Procedure: DILATATION AND CURETTAGE /HYSTEROSCOPY;  Surgeon: Jonnie Kind, MD;  Location: [facility];  Service: Gynecology;  Laterality: N/A;   REPAIR VAGINAL CUFF N/A 07/30/2014   Procedure: REPAIR VAGINAL CUFF;  Surgeon: Mora Bellman, MD;  Location: [facility];  Service: Gynecology;  Laterality: N/A;    SAVORY DILATION  06/20/2012   Dr. Barnie Alderman gastritis/Ulcer in the mid jejunum. Empiric dilation.    SURAL NERVE BX Left 02/25/2016   Procedure: LEFT SURAL NERVE BIOPSY;  Surgeon: Jovita Gamma, MD;  Location: [facility];  Service: Neurosurgery;  Laterality: Left;  Left sural nerve biopsy   TONSILLECTOMY     TONSILLECTOMY AND ADENOIDECTOMY     UPPER GASTROINTESTINAL ENDOSCOPY  JULY 2011 NAUSEA-D125,V6, PH 25   Bx; GASTRITIS, POUCH-5 CM LONG   WISDOM TOOTH EXTRACTION     Medications: Reviewed & Updated - see associated section                       Current Outpatient Prescriptions:    albuterol (PROVENTIL HFA;VENTOLIN HFA) 108 (90 Base) MCG/ACT inhaler, Inhale 2 puffs into the lungs every 6 (six) hours as needed for wheezing., Disp: 1 Inhaler, Rfl: 2   amphetamine-dextroamphetamine (ADDERALL XR) 30 MG 24 hr capsule, Take 1 capsule (30 mg total) by mouth daily., Disp: 30 capsule, Rfl: 0   carbamazepine (TEGRETOL) 200 MG tablet, Take two tablets at bedtime (Patient taking differently: Take 400 mg by mouth at bedtime. Take two tablets at bedtime), Disp: 60 tablet, Rfl: 2   clonazePAM (KLONOPIN) 0.5 MG tablet, Take 1 tablet (0.5 mg total) by mouth 3 (three) times daily., Disp: 90 tablet, Rfl: 2   cyclobenzaprine (FLEXERIL) 10 MG tablet, TAKE (1) TABLET BY MOUTH EVERY 8 HOURS AS NEEDED., Disp: 30 tablet, Rfl: 0   DULoxetine (CYMBALTA) 60 MG capsule, Take 1 capsule (60 mg total) by mouth 2 (two) times daily., Disp: 60 capsule, Rfl: 2   HYDROcodone-acetaminophen (NORCO) 10-325 MG tablet, Take 1 tablet by mouth every 6 (six) hours as needed., Disp: 30 tablet, Rfl: 0   Pantoprazole Sodium (PROTONIX PO), Take 40 mg by mouth daily., Disp: , Rfl:    promethazine (PHENERGAN) 25 MG tablet, Take 1 tablet (25 mg total) by mouth 2 (two) times daily as needed for nausea or vomiting., Disp: 30 tablet, Rfl: 0   topiramate (TOPAMAX) 50 MG tablet, Taking 1 in QAm and 2 QHS (Patient taking  differently: Take 50-100 mg by mouth 2 (two) times daily. TAKE 50MG IN THE MORNING AND 100MG AT BEDTIME), Disp: 90 tablet, Rfl: 2   Social History: Reviewed -  reports that she has quit smoking. Her smoking use included Cigarettes. She has a 1.50 pack-year smoking history. She has never used smokeless tobacco.  Objective Findings:  Vitals: Height _0  (1.575 m), weight 219 lb 9.6 oz (99.6 kg), last  menstrual period 01/08/2017.  Physical Examination: discussion only  Discussion: 1. Discussed with pt risks and benefits of IUD vs endometrial ablation and tubal ligation   At end of discussion, pt had opportunity to ask questions and has no further questions at this time.   Specific discussion of IUD vs endometrial ablation and tubal ligation as noted above. Greater than 50% was spent in counseling and coordination of care with the patient.   Total time greater than: 25 minutes.    Assessment & Plan:   A:  1. Menorrhagia  2. Desire for sterilization  P:  1. Sign tubal ligation paperwork today 2. Follow up in 3 weeks for pre-op for Bilateral tubal ligation, and endometrial ablation  By signing my name below, I, Soijett Blue, attest that this documentation has been prepared under the direction and in the presence of Jonnie Kind, MD. Electronically Signed: Soijett Blue, ED Scribe. 01/27/17. 2:28 PM.   I personally performed the services described in this documentation, which was SCRIBED in my presence. The recorded information has been reviewed and considered accurate. It has been edited as necessary during review. Jonnie Kind, MD

## 2017-01-29 NOTE — Progress Notes (Signed)
[facility] Visit  01/27/17          Patient name: Penny [name] MRN [id number]  Date of birth: [birth year]  CC & HPI:  Penny [name] is a 31 y.o. female presenting today for follow up in regards to [name] results. Pt reports associated heavy menorrhagia and pelvic pain. Pt voices that she would like to no longer have menstrual cycles due to having to obtain monthly IRON transfusions. Pt notes that she has had a gastric sleeve in the past. Pt hasn't tried any medications for the relief of her symptoms. Pt denies any other symptoms.    ROS:  ROS +Menorrhagia +pelvic pain Pt is so focused on prior medical conditions. Theres a PTSD effect to her behavior. Pertinent History Reviewed:   Reviewed: Significant for dysmenorrhea Medical         Past Medical History:  Diagnosis Date   ADD (attention deficit disorder)    Anemia 2011   2o to GASTRIC BYPASS   Anginal pain ([place])    Anxiety    Blood transfusion without reported diagnosis    Chronic daily headache    Depression    Depression    Dysmenorrhea 09/18/2013   Dysrhythmia    Elevated liver enzymes JUL 2011ALK PHOS 111-127 AST  143-267 ALT  213-321T BILI 0.6  ALB  3.7-4.06 Jun 2011 ALK PHOS 118 AST 24 ALT 42 T BILI 0.4 ALB 3.9   Encounter for drug rehabilitation    at behavioral health for opioid addiction about 4 years ago   Family history of adverse reaction to anesthesia    'dad had to be kept on pump for breathing for morphine'   Fatty liver    Fibroid 01/18/2017   Fibromyalgia    Gastric bypass status for obesity    Gastritis JULY 2011   Heart murmur    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   History of Holter monitoring    Hx of opioid abuse    for about 4 years, about 4 years ago   Interstitial cystitis    Iron deficiency anemia 07/23/2010   Irritable bowel syndrome 2012 DIARRHEA   JUN 2012 TTG IgA 14.9   IUD FEB 2010   Lupus    Menorrhagia 07/18/2013    Migraines    Obesity (BMI 30-39.9) 2011 228 LBS BMI 36.8   Ovarian cyst    Patient desires pregnancy 09/18/2013   Polyneuropathy ([place])    PONV (postoperative nausea and vomiting) seizure post-operatively   Potassium (K) deficiency    Pregnant 12/25/2013   Psychiatric pseudoseizure    RLQ abdominal pain 07/18/2013   Sciatica of left side 11/14/2014   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   Stress 09/18/2013                              Surgical Hx:    Past Surgical History:  Procedure Laterality Date   BIOPSY  04/10/2015   Procedure: BIOPSY;  Surgeon: Daneil Dolin, MD;  Location: [facility];  Service: Endoscopy;;   cath self every nite     for sodium bicarb injection (discontinued 2013)   CHOLECYSTECTOMY  2005   biliary dyskinesia   COLONOSCOPY  JUN 2012 ABD PN/DIARRHEA WITH PROPOFOL   NL COLON   DILATION AND CURETTAGE OF UTERUS     ESOPHAGEAL DILATION N/A 04/10/2015   Procedure: ESOPHAGEAL DILATION WITH 54FR MALONEY DILATOR;  Surgeon: Daneil Dolin, MD;  Location: [facility];  Service: Endoscopy;  Laterality: N/A;   ESOPHAGOGASTRODUODENOSCOPY     ESOPHAGOGASTRODUODENOSCOPY (EGD) WITH PROPOFOL N/A 04/10/2015   Procedure: ESOPHAGOGASTRODUODENOSCOPY (EGD) WITH PROPOFOL;  Surgeon: Daneil Dolin, MD;  Location: [facility];  Service: Endoscopy;  Laterality: N/A;   ESOPHAGOGASTRODUODENOSCOPY (EGD) WITH PROPOFOL N/A 12/06/2016   Procedure: ESOPHAGOGASTRODUODENOSCOPY (EGD) WITH PROPOFOL;  Surgeon: Danie Binder, MD;  Location: [facility];  Service: Endoscopy;  Laterality: N/A;   GAB  2007   in [place] 5 CM   GASTRIC BYPASS  06/2006   HYSTEROSCOPY W/D&C N/A 09/12/2014   Procedure: DILATATION AND CURETTAGE /HYSTEROSCOPY;  Surgeon: Jonnie Kind, MD;  Location: [facility];  Service: Gynecology;  Laterality: N/A;   REPAIR VAGINAL CUFF N/A 07/30/2014   Procedure: REPAIR VAGINAL CUFF;  Surgeon: Mora Bellman, MD;  Location: [facility];  Service: Gynecology;  Laterality: N/A;    SAVORY DILATION  06/20/2012   Dr. Barnie Alderman gastritis/Ulcer in the mid jejunum. Empiric dilation.    SURAL NERVE BX Left 02/25/2016   Procedure: LEFT SURAL NERVE BIOPSY;  Surgeon: Jovita Gamma, MD;  Location: [facility];  Service: Neurosurgery;  Laterality: Left;  Left sural nerve biopsy   TONSILLECTOMY     TONSILLECTOMY AND ADENOIDECTOMY     UPPER GASTROINTESTINAL ENDOSCOPY  JULY 2011 NAUSEA-D125,V6, PH 25   Bx; GASTRITIS, POUCH-5 CM LONG   WISDOM TOOTH EXTRACTION     Medications: Reviewed & Updated - see associated section                       Current Outpatient Prescriptions:    albuterol (PROVENTIL HFA;VENTOLIN HFA) 108 (90 Base) MCG/ACT inhaler, Inhale 2 puffs into the lungs every 6 (six) hours as needed for wheezing., Disp: 1 Inhaler, Rfl: 2   amphetamine-dextroamphetamine (ADDERALL XR) 30 MG 24 hr capsule, Take 1 capsule (30 mg total) by mouth daily., Disp: 30 capsule, Rfl: 0   carbamazepine (TEGRETOL) 200 MG tablet, Take two tablets at bedtime (Patient taking differently: Take 400 mg by mouth at bedtime. Take two tablets at bedtime), Disp: 60 tablet, Rfl: 2   clonazePAM (KLONOPIN) 0.5 MG tablet, Take 1 tablet (0.5 mg total) by mouth 3 (three) times daily., Disp: 90 tablet, Rfl: 2   cyclobenzaprine (FLEXERIL) 10 MG tablet, TAKE (1) TABLET BY MOUTH EVERY 8 HOURS AS NEEDED., Disp: 30 tablet, Rfl: 0   DULoxetine (CYMBALTA) 60 MG capsule, Take 1 capsule (60 mg total) by mouth 2 (two) times daily., Disp: 60 capsule, Rfl: 2   HYDROcodone-acetaminophen (NORCO) 10-325 MG tablet, Take 1 tablet by mouth every 6 (six) hours as needed., Disp: 30 tablet, Rfl: 0   Pantoprazole Sodium (PROTONIX PO), Take 40 mg by mouth daily., Disp: , Rfl:    promethazine (PHENERGAN) 25 MG tablet, Take 1 tablet (25 mg total) by mouth 2 (two) times daily as needed for nausea or vomiting., Disp: 30 tablet, Rfl: 0   topiramate (TOPAMAX) 50 MG tablet, Taking 1 in QAm and 2 QHS (Patient taking  differently: Take 50-100 mg by mouth 2 (two) times daily. TAKE 50MG IN THE MORNING AND 100MG AT BEDTIME), Disp: 90 tablet, Rfl: 2   Social History: Reviewed -  reports that she has quit smoking. Her smoking use included Cigarettes. She has a 1.50 pack-year smoking history. She has never used smokeless tobacco.  Objective Findings:  Vitals: Height _0  (1.575 m), weight 219 lb 9.6 oz (99.6 kg), last  menstrual period 01/08/2017.  Physical Examination: discussion only  Discussion: 1. Discussed with pt risks and benefits of IUD vs endometrial ablation and tubal ligation   At end of discussion, pt had opportunity to ask questions and has no further questions at this time.   Specific discussion of IUD vs endometrial ablation and tubal ligation as noted above. Greater than 50% was spent in counseling and coordination of care with the patient.   Total time greater than: 25 minutes.    Assessment & Plan:   A:  1. Menorrhagia  2. Desire for sterilization  P:  1. Sign tubal ligation paperwork today 2. Follow up in 3 weeks for pre-op for Bilateral tubal ligation, and endometrial ablation  By signing my name below, I, Soijett Blue, attest that this documentation has been prepared under the direction and in the presence of Jonnie Kind, MD. Electronically Signed: Soijett Blue, ED Scribe. 01/27/17. 2:28 PM.   I personally performed the services described in this documentation, which was SCRIBED in my presence. The recorded information has been reviewed and considered accurate. It has been edited as necessary during review. Jonnie Kind, MD

## 2017-02-02 ENCOUNTER — Telehealth: Payer: Self-pay | Admitting: *Deleted

## 2017-02-02 NOTE — Telephone Encounter (Signed)
Patient called stating she is not getting any relief from the Flexeril. Please advise.

## 2017-02-03 ENCOUNTER — Telehealth: Payer: Self-pay | Admitting: *Deleted

## 2017-02-03 NOTE — Telephone Encounter (Signed)
LMOVM that she needs to move her appointment up with Dr Glo Herring and to call us back to reschedule.

## 2017-02-04 NOTE — Telephone Encounter (Signed)
Pt has pain, flexeril not helpin, has seen Dr Glo Herring and is having surgery, I can;t give any stronger meds, as she has seizures with toradol, advised to go to ER, she said she would tough it out, not going to ER.

## 2017-02-04 NOTE — Telephone Encounter (Signed)
Please call this patient. Thanks.

## 2017-02-07 ENCOUNTER — Telehealth (HOSPITAL_COMMUNITY): Payer: Self-pay | Admitting: *Deleted

## 2017-02-07 ENCOUNTER — Encounter (HOSPITAL_COMMUNITY): Payer: Self-pay | Admitting: Psychiatry

## 2017-02-07 ENCOUNTER — Ambulatory Visit (INDEPENDENT_AMBULATORY_CARE_PROVIDER_SITE_OTHER): Payer: Medicaid Other | Admitting: Psychiatry

## 2017-02-07 ENCOUNTER — Telehealth: Payer: Self-pay | Admitting: *Deleted

## 2017-02-07 DIAGNOSIS — F332 Major depressive disorder, recurrent severe without psychotic features: Secondary | ICD-10-CM

## 2017-02-07 MED ORDER — CYCLOBENZAPRINE HCL 10 MG PO TABS
10.0000 mg | ORAL_TABLET | Freq: Three times a day (TID) | ORAL | 0 refills | Status: DC
Start: 2017-02-07 — End: 2017-04-04

## 2017-02-07 NOTE — Telephone Encounter (Signed)
She won't run out until 3/12 so she can rechedule before that date

## 2017-02-07 NOTE — Telephone Encounter (Signed)
Penny [name] is here today, seeing [name].   She is scheduled to see you tomorrow.   She don't want to come tomorrow, said her daughter has appointment tomorrow.   She wants her refills today.

## 2017-02-07 NOTE — Telephone Encounter (Signed)
Will refill flexeril.  

## 2017-02-07 NOTE — Progress Notes (Signed)
   THERAPIST PROGRESS NOTE  Session Time:   Monday 02/07/2017 1:10 PM -  1:59 PM  Participation Level: Active  Behavioral Response: CasualAlertAnxious and Depressed  Type of Therapy: Individual Therapy   Treatment Goals: 1. Implement coping and behavioral strategies to overcome depression.    2. Implement calming and coping techniques to decrease intensity and frequency of anxiety response  Treatment Goals addressed:  1,  Interventions: CBT and Supportive  Summary: Penny [name] is a 31 y.o. female who initially was referred for services by psychiatrist Dr. Harrington Challenger in 2016  to improve coping skills. Patient was treated inpatient at [facility] in [place] in July 2016 for depression. She reports suffering for depression since she delivered her now six-year-old son and suffered "baby blues". Patient reports becoming depressed again when she was 7 months pregnant with her second child as she was put on complete bedrest. Also during this time, her parents separated after 51 years of marriage. Patient reports her health worsened overall and she began having seizures the day before delivering her second child in August 2015. She says this pregnancy was very stressful on her marriage and having more stress when baby was born prematurely and had to stay in NICU. 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  She reports stress related to poor relationship with father due to issues with his girlfriend.states her dad's girlfriend hates her and tries to keep dad out of patient's life. 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    Patient last was seen about 7-8 weeks ago. She reports  cancelling previous appointment as child was sick. She continues to experience depressed mood, isolative behaviors,  and significant anxiety. She reports stress to distant relationship with father as she doesn't want her children around his fiancee who has has exhibited negative behavior toward patient. Patient expresses sadness her father has not been as involved or asked about her children since their conflict. She reports continued strong support from mother and stepfather. She also reports she and her husband continue to reside separately but are getting along well and coparenting their children. She continues to express frustration regarding her health issues but also expresses increased desire to try to become more active.      Suicidal/Homicidal: No  Therapist Response: Reviewed symptoms, reviewed treatment plan, began to identify patient's values and activities she can pursue consistent with her values, discussed ways to increase social involvement including being with her children outside of her room and with the rest of the family for 2 hours per day.   Plan: Return again in 2 weeks.  Diagnosis: Axis I: Anxiety Disorder NOS and Depressive Disorder NOS    Axis II: Deferred    Amoni Scallan, LCSW 02/07/2017

## 2017-02-08 ENCOUNTER — Ambulatory Visit (HOSPITAL_COMMUNITY): Payer: Medicaid Other | Admitting: Psychiatry

## 2017-02-09 ENCOUNTER — Encounter (HOSPITAL_COMMUNITY): Payer: Self-pay | Admitting: Psychiatry

## 2017-02-09 ENCOUNTER — Ambulatory Visit (INDEPENDENT_AMBULATORY_CARE_PROVIDER_SITE_OTHER): Payer: Medicaid Other | Admitting: Psychiatry

## 2017-02-09 ENCOUNTER — Telehealth (HOSPITAL_COMMUNITY): Payer: Self-pay | Admitting: *Deleted

## 2017-02-09 VITALS — BP 96/76 | HR 89 | Ht 62.0 in | Wt 218.4 lb

## 2017-02-09 DIAGNOSIS — Z87891 Personal history of nicotine dependence: Secondary | ICD-10-CM

## 2017-02-09 DIAGNOSIS — Z818 Family history of other mental and behavioral disorders: Secondary | ICD-10-CM | POA: Diagnosis not present

## 2017-02-09 DIAGNOSIS — F332 Major depressive disorder, recurrent severe without psychotic features: Secondary | ICD-10-CM

## 2017-02-09 DIAGNOSIS — Z91040 Latex allergy status: Secondary | ICD-10-CM | POA: Diagnosis not present

## 2017-02-09 DIAGNOSIS — Z888 Allergy status to other drugs, medicaments and biological substances status: Secondary | ICD-10-CM

## 2017-02-09 DIAGNOSIS — Z79899 Other long term (current) drug therapy: Secondary | ICD-10-CM | POA: Diagnosis not present

## 2017-02-09 MED ORDER — CARBAMAZEPINE 200 MG PO TABS: ORAL_TABLET | ORAL | 2 refills | Status: DC

## 2017-02-09 MED ORDER — DULOXETINE HCL 60 MG PO CPEP: 60.0000 mg | ORAL_CAPSULE | Freq: Two times a day (BID) | ORAL | 2 refills | Status: DC

## 2017-02-09 MED ORDER — CLONAZEPAM 0.5 MG PO TABS: 0.5000 mg | ORAL_TABLET | Freq: Three times a day (TID) | ORAL | 2 refills | Status: DC

## 2017-02-09 MED ORDER — TOPIRAMATE 50 MG PO TABS: ORAL_TABLET | ORAL | 2 refills | Status: DC

## 2017-02-09 MED ORDER — ESZOPICLONE 3 MG PO TABS: 3.0000 mg | ORAL_TABLET | Freq: Every day | ORAL | 2 refills | Status: DC

## 2017-02-09 MED ORDER — AMPHETAMINE-DEXTROAMPHET ER 30 MG PO CP24: 30.0000 mg | ORAL_CAPSULE | Freq: Every day | ORAL | 0 refills | Status: DC
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
## 2017-02-09 NOTE — Telephone Encounter (Signed)
phone call from patient asking what did Dr. Harrington Challenger say the other day about her meds.  She has an attitude, trying to get me to say Dr. Harrington Challenger did not respond to her message the other day.

## 2017-02-09 NOTE — Telephone Encounter (Signed)
Spoke with pt and she have an appt with provider today 02-09-2017.

## 2017-02-09 NOTE — Telephone Encounter (Signed)
As I said before, she needs to come in for her appointment

## 2017-02-15 ENCOUNTER — Telehealth (HOSPITAL_COMMUNITY): Payer: Self-pay | Admitting: *Deleted

## 2017-02-15 NOTE — Telephone Encounter (Signed)
Prior authorization for [facility] received. Filled out form from online and faxed to office staff for MD signature to fax to [place] tracks.

## 2017-02-16 NOTE — Telephone Encounter (Signed)
Form put in provider's box to sign

## 2017-02-17 ENCOUNTER — Encounter: Payer: Medicaid Other | Admitting: Obstetrics and Gynecology

## 2017-02-21 ENCOUNTER — Ambulatory Visit (HOSPITAL_COMMUNITY): Payer: Self-pay | Admitting: Psychiatry

## 2017-02-24 ENCOUNTER — Telehealth: Payer: Self-pay | Admitting: Adult Health

## 2017-02-24 MED ORDER — BUTALBITAL-APAP-CAFFEINE 50-300-40 MG PO CAPS: ORAL_CAPSULE | ORAL | 0 refills | Status: DC

## 2017-02-24 MED ORDER — PROMETHAZINE HCL 25 MG PO TABS: 25.0000 mg | ORAL_TABLET | Freq: Four times a day (QID) | ORAL | 1 refills | Status: DC | PRN

## 2017-02-24 NOTE — Telephone Encounter (Signed)
Has spasms on right of uterus, is on period, with clots and has migraine fo 4 days with nausea, will rx Fioricet an phenergan.

## 2017-02-28 ENCOUNTER — Encounter: Payer: Self-pay | Admitting: Obstetrics and Gynecology

## 2017-02-28 ENCOUNTER — Ambulatory Visit (INDEPENDENT_AMBULATORY_CARE_PROVIDER_SITE_OTHER): Payer: Medicaid Other | Admitting: Obstetrics and Gynecology

## 2017-02-28 VITALS — HR 80 | Wt 220.0 lb

## 2017-02-28 DIAGNOSIS — Z113 Encounter for screening for infections with a predominantly sexual mode of transmission: Secondary | ICD-10-CM

## 2017-02-28 DIAGNOSIS — Z803 Family history of malignant neoplasm of breast: Secondary | ICD-10-CM | POA: Insufficient documentation

## 2017-02-28 MED ORDER — BUTALBITAL-APAP-CAFFEINE 50-300-40 MG PO CAPS: ORAL_CAPSULE | ORAL | 2 refills | Status: DC

## 2017-02-28 NOTE — Patient Instructions (Signed)
Endometrial Ablation Endometrial ablation is a procedure that destroys the thin inner layer of the lining of the uterus (endometrium). This procedure may be done:  To stop heavy periods.  To stop bleeding that is causing anemia.  To control irregular bleeding.  To treat bleeding caused by small tumors (fibroids) in the endometrium.  This procedure is often an alternative to major surgery, such as removal of the uterus and cervix (hysterectomy). As a result of this procedure:  You may not be able to have children. However, if you are premenopausal (you have not gone through menopause): ? You may still have a small chance of getting pregnant. ? You will need to use a reliable method of birth control after the procedure to prevent pregnancy.  You may stop having a menstrual period, or you may have only a small amount of bleeding during your period. Menstruation may return several years after the procedure.  Tell a health care provider about:  Any allergies you have.  All medicines you are taking, including vitamins, herbs, eye drops, creams, and over-the-counter medicines.  Any problems you or family members have had with the use of anesthetic medicines.  Any blood disorders you have.  Any surgeries you have had.  Any medical conditions you have. What are the risks? Generally, this is a safe procedure. However, problems may occur, including:  A hole (perforation) in the uterus or bowel.  Infection of the uterus, bladder, or vagina.  Bleeding.  Damage to other structures or organs.  An air bubble in the lung (air embolus).  Problems with pregnancy after the procedure.  Failure of the procedure.  Decreased ability to diagnose cancer in the endometrium.  What happens before the procedure?  You will have tests of your endometrium to make sure there are no pre-cancerous cells or cancer cells present.  You may have an ultrasound of the uterus.  You may be given  medicines to thin the endometrium.  Ask your health care provider about: ? Changing or stopping your regular medicines. This is especially important if you take diabetes medicines or blood thinners. ? Taking medicines such as aspirin and ibuprofen. These medicines can thin your blood. Do not take these medicines before your procedure if your doctor tells you not to.  Plan to have someone take you home from the hospital or clinic. What happens during the procedure?  You will lie on an exam table with your feet and legs supported as in a pelvic exam.  To lower your risk of infection: ? Your health care team will wash or sanitize their hands and put on germ-free (sterile) gloves. ? Your genital area will be washed with soap.  An IV tube will be inserted into one of your veins.  You will be given a medicine to help you relax (sedative).  A surgical instrument with a light and camera (resectoscope) will be inserted into your vagina and moved into your uterus. This allows your surgeon to see inside your uterus.  Endometrial tissue will be removed using one of the following methods: ? Radiofrequency. This method uses a radiofrequency-alternating electric current to remove the endometrium. ? Cryotherapy. This method uses extreme cold to freeze the endometrium. ? Heated-free liquid. This method uses a heated saltwater (saline) solution to remove the endometrium. ? Microwave. This method uses high-energy microwaves to heat up the endometrium and remove it. ? Thermal balloon. This method involves inserting a catheter with a balloon tip into the uterus. The balloon tip is   fluid to remove the endometrium. The procedure may vary among health care providers and hospitals. What happens after the procedure?  Your blood pressure, heart rate, breathing rate, and blood oxygen level will be monitored until the medicines you were given have worn off.  As tissue healing occurs, you may notice vaginal  bleeding for 4-6 weeks after the procedure. You may also experience:  Cramps.  Thin, watery vaginal discharge that is light pink or brown in color.  A need to urinate more frequently than usual.  Nausea.  Do not drive for 24 hours if you were given a sedative.  Do not have sex or insert anything into your vagina until your health care provider approves. Summary  Endometrial ablation is done to treat the many causes of heavy menstrual bleeding.  The procedure may be done only after medications have been tried to control the bleeding.  Plan to have someone take you home from the hospital or clinic. This information is not intended to replace advice given to you by your health care provider. Make sure you discuss any questions you have with your health care provider. Document Released: 10/01/2004 Document Revised: 12/09/2016 Document Reviewed: 12/09/2016 Elsevier Interactive Patient Education  2017 [employer]  Laparoscopic Tubal Ligation, Care After Refer to this sheet in the next few weeks. These instructions provide you with information about caring for yourself after your procedure. Your health care provider may also give you more specific instructions. Your treatment has been planned according to current medical practices, but problems sometimes occur. Call your health care provider if you have any problems or questions after your procedure. What can I expect after the procedure? After the procedure, it is common to have:  A sore throat.  Discomfort in your shoulder.  Mild discomfort or cramping in your abdomen.  Gas pains.  Pain or soreness in the area where the surgical cut (incision) was made.  A bloated feeling.  Tiredness.  Nausea.  Vomiting. Follow these instructions at home: Medicines   Take over-the-counter and prescription medicines only as told by your health care provider.  Do not take aspirin because it can cause bleeding.  Do not drive or operate  heavy machinery while taking prescription pain medicine. Activity   Rest for the rest of the day.  Return to your normal activities as told by your health care provider. Ask your health care provider what activities are safe for you. Incision care    Follow instructions from your health care provider about how to take care of your incision. Make sure you:  Wash your hands with soap and water before you change your bandage (dressing). If soap and water are not available, use hand sanitizer.  Change your dressing as told by your health care provider.  Leave stitches (sutures) in place. They may need to stay in place for 2 weeks or longer.  Check your incision area every day for signs of infection. Check for:  More redness, swelling, or pain.  More fluid or blood.  Warmth.  Pus or a bad smell. Other Instructions   Do not take baths, swim, or use a hot tub until your health care provider approves. You may take showers.  Keep all follow-up visits as told by your health care provider. This is important.  Have someone help you with your daily household tasks for the first few days. Contact a health care provider if:  You have more redness, swelling, or pain around your incision.  Your incision feels  warm to the touch.  You have pus or a bad smell coming from your incision.  The edges of your incision break open after the sutures have been removed.  Your pain does not improve after 2-3 days.  You have a rash.  You repeatedly become dizzy or light-headed.  Your pain medicine is not helping.  You are constipated. Get help right away if:  You have a fever.  You faint.  You have increasing pain in your abdomen.  You have severe pain in one or both of your shoulders.  You have fluid or blood coming from your sutures or from your vagina.  You have shortness of breath or difficulty breathing.  You have chest pain or leg pain.  You have ongoing nausea, vomiting, or  diarrhea. This information is not intended to replace advice given to you by your health care provider. Make sure you discuss any questions you have with your health care provider. Document Released: 06/11/2005 Document Revised: 04/26/2016 Document Reviewed: 11/02/2015 Elsevier Interactive Patient Education  2017 [place].  Abdominal Hysterectomy, Care After This sheet gives you information about how to care for yourself after your procedure. Your doctor may also give you more specific instructions. If you have problems or questions, contact your doctor. Follow these instructions at home: Bathing   Do not take baths, swim, or use a hot tub until your doctor says it is okay. Ask your doctor if you can take showers. You may only be allowed to take sponge baths for bathing.  Keep the bandage (dressing) dry until your doctor says it can be taken off. Surgical cut (  incision) care  Follow instructions from your doctor about how to take care of your cut from surgery. Make sure you:  Wash your hands with soap and water before you change your bandage (dressing). If you cannot use soap and water, use hand sanitizer.  Change your bandage as told by your doctor.  Leave stitches (sutures), skin glue, or skin tape (adhesive) strips in place. They may need to stay in place for 2 weeks or longer. If tape strips get loose and curl up, you may trim the loose edges. Do not remove tape strips completely unless your doctor says it is okay.  Check your surgical cut area every day for signs of infection. Check for:  Redness, swelling, or pain.  Fluid or blood.  Warmth.  Pus or a bad smell. Activity  Do gentle, daily exercise as told by your doctor. You may be told to take short walks every day and go farther each time.  Do not lift anything that is heavier than 10 lb (4.5 kg), or the limit that your doctor tells you, until he or she says that it is safe.  Do not drive or use heavy machinery  while taking prescription pain medicine.  Do not drive for 24 hours if you were given a medicine to help you relax (sedative).  Follow your doctor's advice about exercise, driving, and general activities. Ask your doctor what activities are safe for you. Lifestyle  Do not douche, use tampons, or have sex for at least 6 weeks or as told by your doctor.  Do not drink alcohol until your doctor says it is okay.  Drink enough fluid to keep your pee (urine) clear or pale yellow.  Try to have someone at home with you for the first 1-2 weeks to help.  Do not use any products that contain nicotine or tobacco, such  as cigarettes and e-cigarettes. These can slow down healing. If you need help quitting, ask your doctor. General instructions  Take over-the-counter and prescription medicines only as told by your doctor.  Do not take aspirin or ibuprofen. These medicines can cause bleeding.  To prevent or treat constipation while you are taking prescription pain medicine, your doctor may suggest that you:  Drink enough fluid to keep your urine clear or pale yellow.  Take over-the-counter or prescription medicines.  Eat foods that are high in fiber, such as:  Fresh fruits and vegetables.  Whole grains.  Beans.  Limit foods that are high in fat and processed sugars, such as fried and sweet foods.  Keep all follow-up visits as told by your doctor. This is important. Contact a doctor if:  You have chills or fever.  You have redness, swelling, or pain around your cut.  You have fluid or blood coming from your cut.  Your cut feels warm to the touch.  You have pus or a bad smell coming from your cut.  Your cut breaks open.  You feel dizzy or light-headed.  You have pain or bleeding when you pee.  You keep having watery poop (diarrhea).  You keep feeling sick to your stomach (nauseous) or keep throwing up (vomiting).  You have unusual fluid (discharge) coming from your  vagina.  You have a rash.  You have a reaction to your medicine.  Your pain medicine does not help. Get help right away if:  You have a fever and your symptoms get worse all of a sudden.  You have very bad belly (abdominal) pain.  You are short of breath.  You pass out (faint).  You have pain, swelling, or redness of your leg.  You bleed a lot from your vagina and notice clumps of blood (clots). Summary  Do not take baths, swim, or use a hot tub until your doctor says it is okay. Ask your doctor if you can take showers. You may only be allowed to take sponge baths for bathing.  Follow your doctor's advice about exercise, driving, and general activities. Ask your doctor what activities are safe for you.  Do not lift anything that is heavier than 10 lb (4.5 kg), or the limit that your doctor tells you, until he or she says that it is safe.  Try to have someone at home with you for the first 1-2 weeks to help. This information is not intended to replace advice given to you by your health care provider. Make sure you discuss any questions you have with your health care provider. Document Released: 08/31/2008 Document Revised: 11/10/2016 Document Reviewed: 11/10/2016 Elsevier Interactive Patient Education  2017 [employer].

## 2017-03-02 LAB — GC/CHLAMYDIA PROBE AMP
Chlamydia trachomatis, NAA: NEGATIVE
NEISSERIA GONORRHOEAE BY PCR: NEGATIVE

## 2017-03-03 NOTE — Progress Notes (Signed)
Preoperative History and Physical  Penny [name] is a 31 y.o. [id number] here for surgical management of menses, and desire for permanent sterilization.   No significant preoperative concerns. She is here with her mother, and we have again reviewed the surgical options separately for both Endometrial ablation, and sterilization, including nonpermanent options. The patient desires reduced menses to improve iron loss, which is requiring recurrent Iron infusions due to poor absorption s.p gastric surgery.  Proposed surgery: Hysteroscopy Dilation and curettage, endometrial ablation. Laparoscopic tubal sterilization Fallope rings.  Past Medical History:  Diagnosis Date  . ADD (attention deficit disorder)   . Anemia 2011   2o to GASTRIC BYPASS  . Anginal pain ([place])   . Anxiety   . Blood transfusion without reported diagnosis   . Chronic daily headache   . Depression   . Depression   . Dysmenorrhea 09/18/2013  . Dysrhythmia   . Elevated liver enzymes JUL 2011ALK PHOS 111-127 AST  143-267 ALT  213-321T BILI 0.6  ALB  3.7-4.06 Jun 2011 ALK PHOS 118 AST 24 ALT 42 T BILI 0.4 ALB 3.9  . Encounter for drug rehabilitation    at behavioral health for opioid addiction about 4 years ago  . Family history of adverse reaction to anesthesia    'dad had to be kept on pump for breathing for morphine'  . Fatty liver   . Fibroid 01/18/2017  . Fibromyalgia   . Gastric bypass status for obesity   . Gastritis JULY 2011  . Heart murmur   . 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 History of Holter monitoring   . Hx of opioid abuse    for about 4 years, about 4 years ago  . Interstitial cystitis   . Iron deficiency anemia 07/23/2010  . Irritable bowel syndrome 2012 DIARRHEA   JUN 2012 TTG IgA 14.9  . IUD FEB 2010  . Lupus   . Menorrhagia 07/18/2013  . Migraines   . Obesity (BMI 30-39.9) 2011 228 LBS BMI 36.8  . Ovarian cyst   . Patient desires pregnancy 09/18/2013  . Polyneuropathy  ([place])   . PONV (postoperative nausea and vomiting) seizure post-operatively  . Potassium (K) deficiency   . Pregnant 12/25/2013  . Psychiatric pseudoseizure   . RLQ abdominal pain 07/18/2013  . Sciatica of left side 11/14/2014  . 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 Stress 09/18/2013   Past Surgical History:  Procedure Laterality Date  . BIOPSY  04/10/2015   Procedure: BIOPSY;  Surgeon: Daneil Dolin, MD;  Location: [facility];  Service: Endoscopy;;  . cath self every nite     for sodium bicarb injection (discontinued 2013)  . CHOLECYSTECTOMY  2005   biliary dyskinesia  . COLONOSCOPY  JUN 2012 ABD PN/DIARRHEA WITH PROPOFOL   NL COLON  . DILATION AND CURETTAGE OF UTERUS    . ESOPHAGEAL DILATION N/A 04/10/2015   Procedure: ESOPHAGEAL DILATION WITH 54FR MALONEY DILATOR;  Surgeon: Daneil Dolin, MD;  Location: [facility];  Service: Endoscopy;  Laterality: N/A;  . ESOPHAGOGASTRODUODENOSCOPY    . ESOPHAGOGASTRODUODENOSCOPY (EGD) WITH PROPOFOL N/A 04/10/2015   Procedure: ESOPHAGOGASTRODUODENOSCOPY (EGD) WITH PROPOFOL;  Surgeon: Daneil Dolin, MD;  Location: [facility];  Service: Endoscopy;  Laterality: N/A;  . ESOPHAGOGASTRODUODENOSCOPY (EGD) WITH PROPOFOL N/A 12/06/2016   Procedure: ESOPHAGOGASTRODUODENOSCOPY (EGD) WITH PROPOFOL;  Surgeon: Danie Binder, MD;  Location: [facility];  Service: Endoscopy;  Laterality: N/A;  . Danella Penton  2007  in [place]-POUCH 5 CM  . GASTRIC BYPASS  06/2006  . HYSTEROSCOPY W/D&C N/A 09/12/2014   Procedure: DILATATION AND CURETTAGE /HYSTEROSCOPY;  Surgeon: Jonnie Kind, MD;  Location: [facility];  Service: Gynecology;  Laterality: N/A;  . REPAIR VAGINAL CUFF N/A 07/30/2014   Procedure: REPAIR VAGINAL CUFF;  Surgeon: Mora Bellman, MD;  Location: [place] [facility];  Service: Gynecology;  Laterality: N/A;  . SAVORY DILATION  06/20/2012   Dr. Barnie Alderman gastritis/Ulcer in the mid jejunum. Empiric dilation.   . SURAL NERVE BX Left 02/25/2016   Procedure: LEFT SURAL NERVE BIOPSY;   Surgeon: Jovita Gamma, MD;  Location: [place] [facility];  Service: Neurosurgery;  Laterality: Left;  Left sural nerve biopsy  . TONSILLECTOMY    . TONSILLECTOMY AND ADENOIDECTOMY    . UPPER GASTROINTESTINAL ENDOSCOPY  JULY 2011 NAUSEA-D125,V6, PH 25   Bx; GASTRITIS, POUCH-5 CM LONG  . WISDOM TOOTH EXTRACTION     OB History  Gravida Para Term Preterm AB Living  '3 2 1 1 1 2  '$ SAB TAB Ectopic Multiple Live Births  1       2    # Outcome Date GA Lbr Len/2nd Weight Sex Delivery Anes PTL Lv  3 Preterm 07/29/14 [id number] / 00:10 5 lb 3.6 oz (2.37 kg) F Vag-Spont EPI  LIV  2 Term 2010 [id number] lb 14 oz (3.572 kg) M Vag-Spont EPI  LIV     Birth Comments: IOL d/t PIH  1 SAB             Patient denies any other pertinent gynecologic issues.   Current Outpatient Prescriptions on File Prior to Visit  Medication Sig Dispense Refill  . albuterol (PROVENTIL HFA;VENTOLIN HFA) 108 (90 Base) MCG/ACT inhaler Inhale 2 puffs into the lungs every 6 (six) hours as needed for wheezing. 1 Inhaler 2  . amphetamine-dextroamphetamine (ADDERALL XR) 30 MG 24 hr capsule Take 1 capsule (30 mg total) by mouth daily. Fill after 03/12/17 30 capsule 0  . carbamazepine (TEGRETOL) 200 MG tablet Take two tablets at bedtime 60 tablet 2  . clonazePAM (KLONOPIN) 0.5 MG tablet Take 1 tablet (0.5 mg total) by mouth 3 (three) times daily. 90 tablet 2  . cyclobenzaprine (FLEXERIL) 10 MG tablet Take 1 tablet (10 mg total) by mouth 3 (three) times daily. 30 tablet 0  . DULoxetine (CYMBALTA) 60 MG capsule Take 1 capsule (60 mg total) by mouth 2 (two) times daily. 60 capsule 2  . HYDROcodone-acetaminophen (NORCO) 10-325 MG tablet Take 1 tablet by mouth every 6 (six) hours as needed. 30 tablet 0  . Pantoprazole Sodium (PROTONIX PO) Take 40 mg by mouth daily.    . Pediatric Multiple Vit-C-FA (PEDIATRIC MULTIVITAMIN) chewable tablet Chew 1 tablet by mouth daily.    . promethazine (PHENERGAN) 25 MG tablet Take 1 tablet (25 mg total) by mouth  every 6 (six) hours as needed for nausea or vomiting. 30 tablet 1  . topiramate (TOPAMAX) 50 MG tablet Taking 1 in QAm and 2 QHS 90 tablet 2  . vitamin B-12 (CYANOCOBALAMIN) 100 MCG tablet Take 100 mcg by mouth daily.     No current facility-administered medications on file prior to visit.    Allergies  Allergen Reactions  . Gabapentin Other (See Comments)    Pt states that she was unresponsive after taking this medication, but her vitals remained stable.    . Metoclopramide Hcl Anxiety and Other (See Comments)    Pt states that she felt like  she was trapped in a box, and could not get out.  Pt also states that she had temporary loss of movement, weakness, and tingling.    . Tramadol Other (See Comments)    Seizures  . Morphine And Related Other (See Comments)    Chest pain   . Nucynta [Tapentadol]     vomiting  . Propofol Nausea And Vomiting  . Trazodone And Nefazodone Other (See Comments)    Makes pt "like a zombie"  . Zofran [Ondansetron] Other (See Comments)    Migraines   . Butrans [Buprenorphine]     Patch caused rash, didn't help with pain  . Latex Rash  . Lyrica [Pregabalin]     suicidal  . Tape Itching and Rash    Please use paper tape    Social History:   reports that she has quit smoking. Her smoking use included Cigarettes. She has a 1.50 pack-year smoking history. She has never used smokeless tobacco. She reports that she does not drink alcohol or use drugs.  Family History  Problem Relation Age of Onset  . Hemochromatosis Maternal Grandmother   . Migraines Maternal Grandmother   . Cancer Maternal Grandmother   . Breast cancer Maternal Grandmother   . Hypertension Father   . Diabetes Father   . Coronary artery disease Father   . Migraines Paternal Grandmother   . Breast cancer Paternal Grandmother   . Cancer Mother     breast  . Hemochromatosis Mother   . Breast cancer Mother   . Depression Mother   . Anxiety disorder Mother   . Coronary artery  disease Paternal Grandfather   . Anxiety disorder Brother   . Bipolar disorder Brother   . Healthy Daughter   . Healthy Son     Review of Systems: Noncontributory  PHYSICAL EXAM: Pulse 80, weight 220 lb (99.8 kg), last menstrual period 02/21/2017. General appearance - alert, well appearing, and in no distress Chest - clear to auscultation, no wheezes, rales or rhonchi, symmetric air entry Heart - normal rate and regular rhythm Abdomen - soft, nontender, nondistended, no masses or organomegaly                      Pelvic - examination done recentlyh Extremities - peripheral pulses normal, no pedal edema, no clubbing or cyanosis  Labs: Results for orders placed or performed in visit on 02/28/17 (from the past 336 hour(s))  GC/Chlamydia Probe Amp   Collection Time: 02/28/17  4:53 PM  Result Value Ref Range   Chlamydia trachomatis, NAA Negative Negative   Neisseria gonorrhoeae by PCR Negative Negative    Imaging Studies: No results found.  Assessment: Patient Active Problem List   Diagnosis Date Noted  . Rapid palpitations 02/18/2014    Priority: Low  . H/O maternal third degree perineal laceration, currently pregnant 02/18/2014    Priority: Low  . Chronic interstitial cystitis 10/23/2013    Priority: Low  . h/o Opiate addiction 03/11/2012    Priority: Low    Class: Acute  . Depression with anxiety 03/10/2012    Priority: Low    Class: Chronic  . Family history of breast cancer in mother,uncertain BR CA status 02/28/2017  . Fibroid 01/18/2017  . Chronic pain syndrome 12/05/2016  . Folate deficiency 12/04/2016  . Nausea vomiting and diarrhea 12/04/2016  . Severe anemia 12/04/2016  . Symptomatic anemia 12/03/2016  . Complaints of leg weakness   . Paresthesia   . Left-sided weakness   .  Uncontrolled pain 01/24/2016  . Malnutrition of moderate degree 01/24/2016  . Neuropathy ([place]) 01/23/2016  . Inability to walk 01/23/2016  . Paresthesias   . Bilateral leg  numbness 11/26/2015  . Major depressive disorder, recurrent episode, 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[place]) 05/10/2015  . Post partum depression 05/09/2015  . Anastomotic ulcer   . Dysphagia   . Hematemesis 04/07/2015  . Intractable vomiting with nausea 04/07/2015  . Elevated liver enzymes   . Epigastric pain   . 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 Seizure-like activity ([place]) 02/22/2015  . Fibromyalgia 02/18/2015  . Vulvar fissure 02/18/2015  . Clinical depression 02/01/2015  . Current smoker 01/29/2015  . Current tobacco use 01/29/2015  . 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 Leg weakness, bilateral 01/02/2015  . Gait disorder 01/02/2015  . Other symptoms and signs involving the musculoskeletal system 01/02/2015  . Abnormal gait 01/02/2015  . Cervicalgia 12/18/2014  . Sciatica of left side 11/14/2014  . Neuralgia neuritis, sciatic nerve 11/14/2014  . 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 Family planning 09/09/2014  . Abdominal pain, acute, right lower quadrant 08/22/2014  . Headache, migraine 08/19/2014  . Seizure ([place]) 08/18/2014  . Encephalopathy 08/13/2014  . Headache 08/13/2014  . Altered mental status 08/13/2014  . Right leg numbness 07/31/2014  . Disturbance of skin sensation 07/31/2014  . Hypertension in pregnancy, transient 07/08/2014  . Previous gastric bypass affecting pregnancy, antepartum 05/22/2014  . 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 ASD (atrial septal defect), ostium secundum 05/17/2014  . Depression complicating pregnancy in second trimester, antepartum 05/09/2014  . Antepartum mental disorder in pregnancy 05/09/2014  . High-risk pregnancy 02/18/2014  . Supervision of pregnancy with other poor reproductive or obstetric history, unspecified trimester 02/18/2014  . Restless legs 01/16/2014  . Restless leg 01/16/2014  . Menorrhagia 07/18/2013  . Excessive and frequent menstruation 07/18/2013  . History of migraine  headaches 03/10/2012    Class: Acute  . 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 Panic disorder without agoraphobia 03/10/2012  . H/O disease 03/10/2012  . Dysthymia 03/10/2012  . Pelvic congestion syndrome 10/13/2011  . Coitalgia 10/13/2011  . Chronic migraine without aura 10/13/2011  . Unspecified dyspareunia 10/13/2011  . IBS (irritable bowel syndrome) 08/25/2011  . Diarrhea 05/27/2011  . OBESITY, UNSPECIFIED 09/17/2010  . Transaminitis 09/17/2010  . Anemia, iron deficiency 07/23/2010    Plan: Patient will undergo surgical management with hysteroscopy, dilation and curettage, endometrial ablation, sterilization by fallope rings.Jonnie Kind, MD  03/03/2017 9:58 PM

## 2017-03-07 ENCOUNTER — Ambulatory Visit (HOSPITAL_COMMUNITY): Payer: Self-pay | Admitting: Psychiatry

## 2017-03-08 ENCOUNTER — Encounter (INDEPENDENT_AMBULATORY_CARE_PROVIDER_SITE_OTHER): Payer: Self-pay | Admitting: Internal Medicine

## 2017-03-08 ENCOUNTER — Ambulatory Visit (INDEPENDENT_AMBULATORY_CARE_PROVIDER_SITE_OTHER): Payer: Medicaid Other | Admitting: Internal Medicine

## 2017-03-08 ENCOUNTER — Telehealth: Payer: Self-pay | Admitting: Obstetrics and Gynecology

## 2017-03-08 VITALS — BP 110/80 | HR 74 | Temp 97.9°F | Resp 18 | Ht 66.0 in | Wt 210.0 lb

## 2017-03-08 DIAGNOSIS — R1011 Right upper quadrant pain: Secondary | ICD-10-CM | POA: Diagnosis not present

## 2017-03-08 DIAGNOSIS — D508 Other iron deficiency anemias: Secondary | ICD-10-CM

## 2017-03-08 IMAGING — DX DG ABDOMEN ACUTE W/ 1V CHEST
3 series · 3 of 3 positions shown · non-contrast
Comparison: Abdominal CT from 1 day prior

CLINICAL DATA: Abdominal pain with vomiting and diarrhea for 3
days. Gastric ulcers.

EXAM:
DG ABDOMEN ACUTE W/ 1V CHEST

[chest pa]
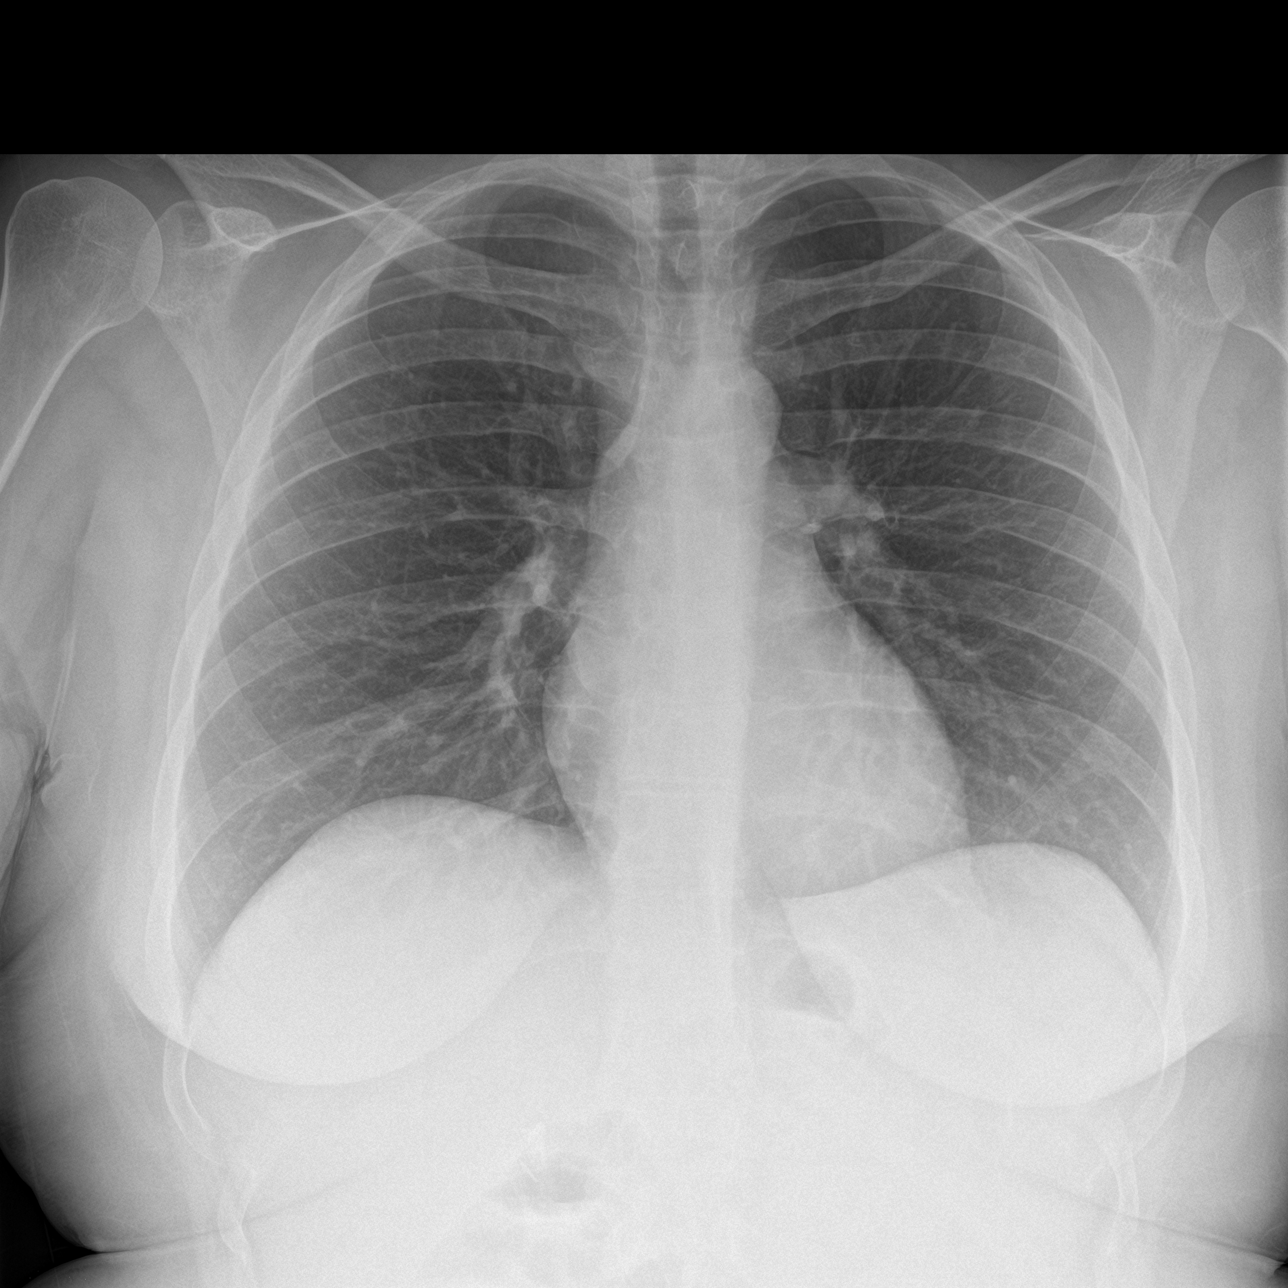

[abdomen erect]
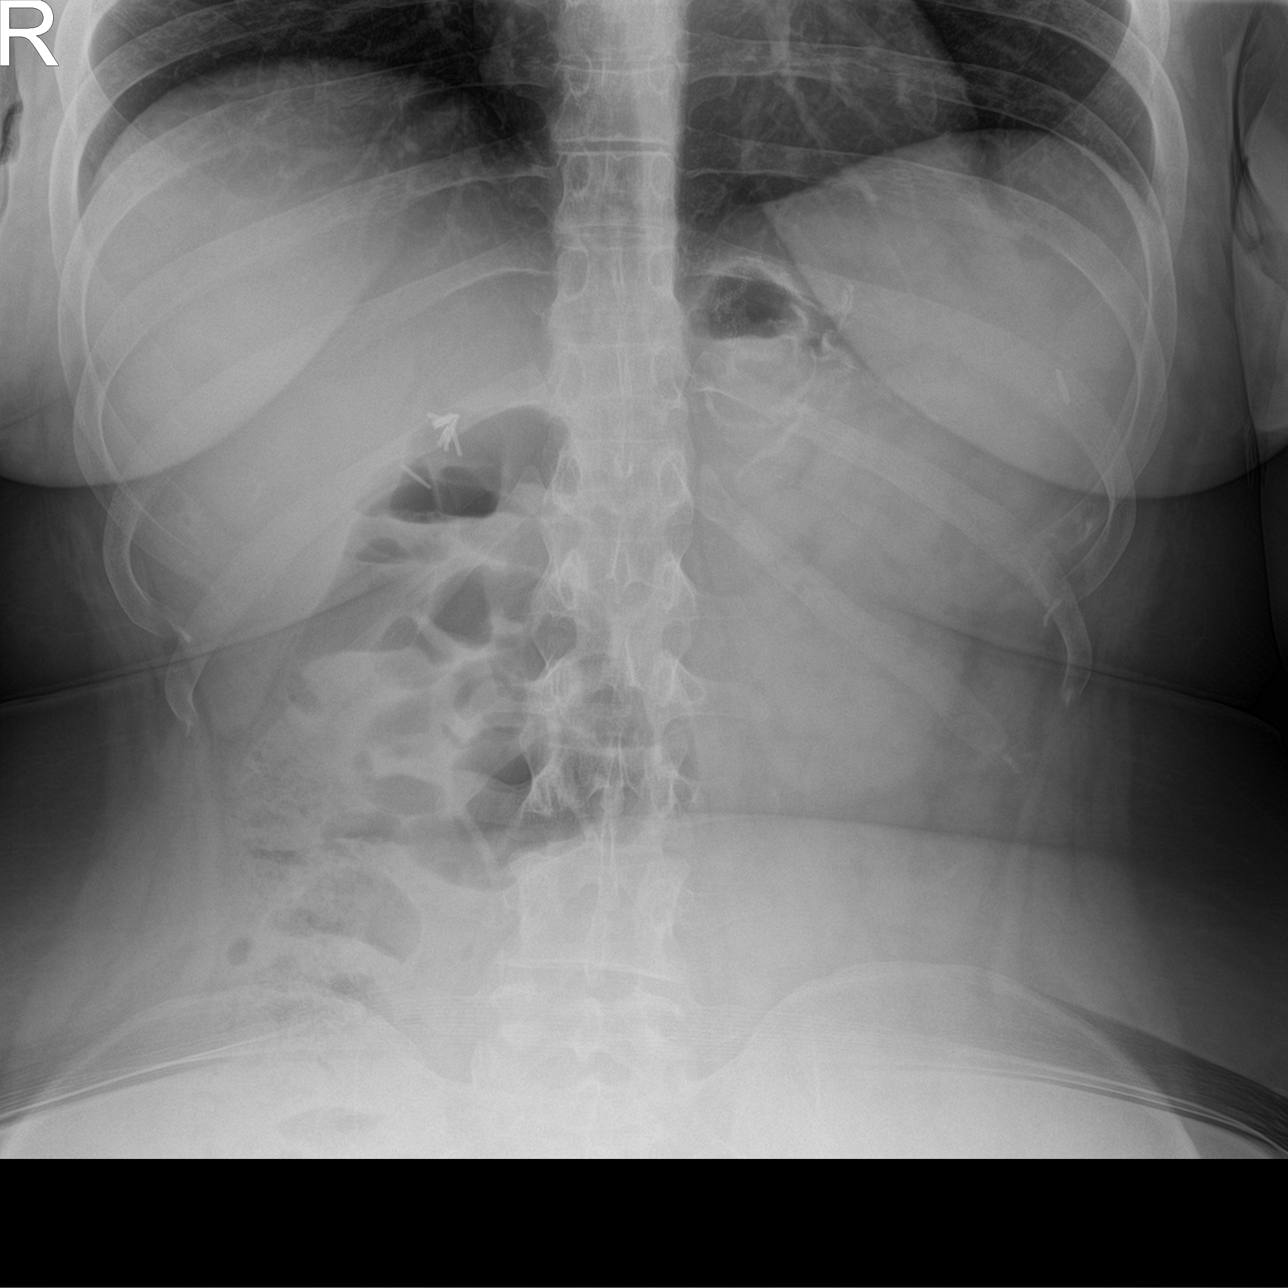

[abdomen supine]
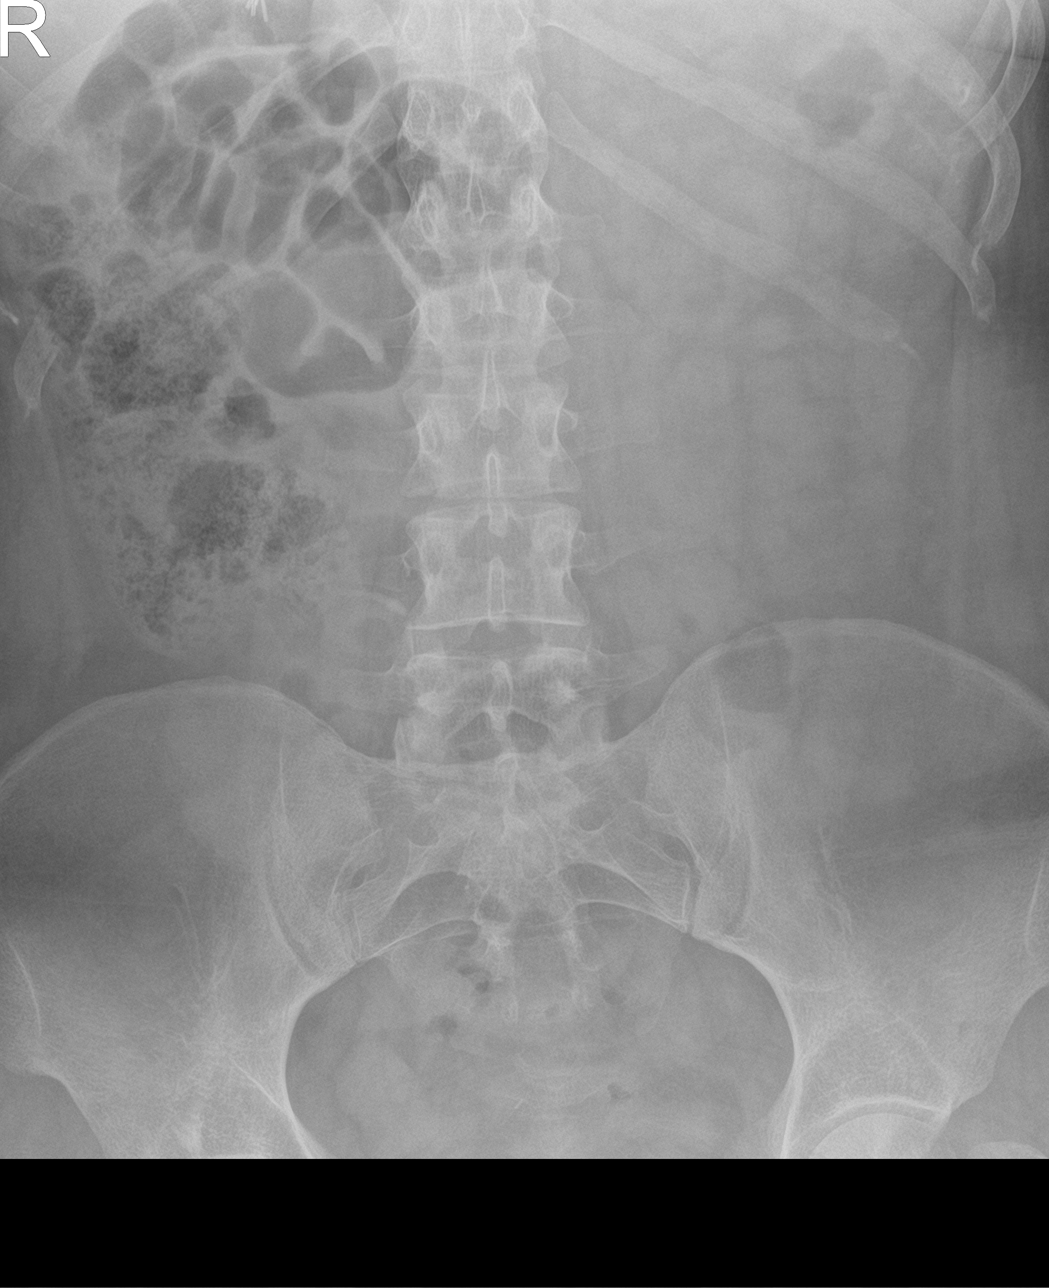

[3 of 3 positions shown; findings below may reference images not displayed]

FINDINGS: There is no evidence of dilated bowel loops or free intraperitoneal
air. No radiopaque calculi or other significant radiographic
abnormality is seen. There are changes of gastric bypass and
cholecystectomy. Heart size and mediastinal contours are within
normal limits. Both lungs are clear.
IMPRESSION: 1. Negative abdominal radiographs. No acute cardiopulmonary disease.
2. Cholecystectomy and gastric bypass.

## 2017-03-08 MED ORDER — FERUMOXYTOL INJECTION 510 MG/17 ML: 510.0000 mg | Freq: Once | INTRAVENOUS | 0 refills | Status: DC

## 2017-03-08 NOTE — Progress Notes (Signed)
Presenting complaint;  Follow-up for right upper quadrant abdominal pain.  Database and Subjective:  Penny [name] is 31 year old Caucasian female who is here for scheduled visit. She was last seen on 01/04/2017. He continues to complain of right upper quadrant abdominal pain. She says she has not had real bad pains since hospitalization few months ago but she is having intermittent pain. She had modest pain 2 days ago. This pain lasted for 2 hours. She is back to her baseline of constipation. She generally has 3 bowel movements per week. She uses Ex-Lax on as-needed basis. She does not have a good appetite. She has been having heavy periods. She is been seen at Dr. Johnnye Sima office and consider for endometrial ablation. She did have blood work at [facility] last week but she does not know the results. When she was hospitalized at [facility] in January 2018 she received iron infusion and her H&H returned to normal. She says she is not taking pain medications in one week.   Current Medications: Outpatient Encounter Prescriptions as of 03/08/2017  Medication Sig  . albuterol (PROVENTIL HFA;VENTOLIN HFA) 108 (90 Base) MCG/ACT inhaler Inhale 2 puffs into the lungs every 6 (six) hours as needed for wheezing.  Marland Kitchen amphetamine-dextroamphetamine (ADDERALL XR) 30 MG 24 hr capsule Take 1 capsule (30 mg total) by mouth daily. Fill after 03/12/17  . Butalbital-APAP-Caffeine 50-300-40 MG CAPS Take 1 every 6 hours prn migraine  . carbamazepine (TEGRETOL) 200 MG tablet Take two tablets at bedtime  . clonazePAM (KLONOPIN) 0.5 MG tablet Take 1 tablet (0.5 mg total) by mouth 3 (three) times daily.  . cyclobenzaprine (FLEXERIL) 10 MG tablet Take 1 tablet (10 mg total) by mouth 3 (three) times daily.  . DULoxetine (CYMBALTA) 60 MG capsule Take 1 capsule (60 mg total) by mouth 2 (two) times daily.  Marland Kitchen HYDROcodone-acetaminophen (NORCO) 10-325 MG tablet Take 1 tablet by mouth every 6 (six) hours as needed.  . Pantoprazole Sodium (PROTONIX PO)  Take 40 mg by mouth daily.  . Pediatric Multivitamins-Iron (FLINTSTONES PLUS IRON PO) Take by mouth daily.  . promethazine (PHENERGAN) 25 MG tablet Take 1 tablet (25 mg total) by mouth every 6 (six) hours as needed for nausea or vomiting.  . topiramate (TOPAMAX) 50 MG tablet Taking 1 in QAm and 2 QHS  . vitamin B-12 (CYANOCOBALAMIN) 100 MCG tablet Take 100 mcg by mouth daily.  . [DISCONTINUED] Pediatric Multiple Vit-C-FA (PEDIATRIC MULTIVITAMIN) chewable tablet Chew 1 tablet by mouth daily.   No facility-administered encounter medications on file as of 03/08/2017.      Objective: Blood pressure 110/80, pulse 74, temperature 97.9 F (36.6 C), temperature source Oral, resp. rate 18, height _0  (1.676 m), weight 210 lb (95.3 kg), last menstrual period 02/21/2017. Patient is alert. Conjunctiva is pale. Conjunctiva is pink. Sclera is nonicteric Oropharyngeal mucosa is normal. No neck masses or thyromegaly noted. Cardiac exam with regular rhythm normal S1 and S2. No murmur or gallop noted. Lungs are clear to auscultation. Abdomen is full. It soft with mild to moderate tenderness in right upper quadrant both in superficial and deep palpation. No organomegaly or masses.  No LE edema or clubbing noted.  Labs/studies Results: Hemoglobin on 03/02/2017 was 8.1.    Assessment:  #1. Right upper quadrant abdominal pain. She has both superficial and deep pain. Her severe pain appears to be due to biliary colic. She is been documented to have fluctuating transaminases. 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 If she  has another well-documented episode she may have to be sent for [facility] or to [facility] An. #2. Iron deficiency anemia. Iron deficiency anemia appears to be secondary to heavy you trying losses. She also does not absorb iron because of bariatric surgery. He has responded quite well to parenteral iron in the past.   Plan:  Patient will go the left for LFTs. Will arrange  for Feraheme 510 mg IV 2 one week apart. She will continue [place]stone chewable with iron 2 tablets daily. H&H in 2 weeks. Office visit in 3 months.

## 2017-03-08 NOTE — Patient Instructions (Signed)
Iron infusion to be scheduled. Physician will call with results of LFTs when completed.

## 2017-03-08 NOTE — Telephone Encounter (Signed)
Telephone call to Penny [name] to see if she had decided the treatement plan that she was more comfortable with., endometrial ablation and tubal ligation, versus hysterectomy. The patient has many concerns for future risk of cancer, and we discussed again the benefits at her young age of ovarian preservation. The patient has a followup appt tomorrow, and further discussion will be necessary. Pt to bring her list of concerns and questions to appt tomorrow.Marland Kitchen

## 2017-03-09 ENCOUNTER — Encounter: Payer: Self-pay | Admitting: Obstetrics and Gynecology

## 2017-03-09 ENCOUNTER — Encounter (INDEPENDENT_AMBULATORY_CARE_PROVIDER_SITE_OTHER): Payer: Self-pay | Admitting: Internal Medicine

## 2017-03-09 ENCOUNTER — Ambulatory Visit (INDEPENDENT_AMBULATORY_CARE_PROVIDER_SITE_OTHER): Payer: Medicaid Other | Admitting: Obstetrics and Gynecology

## 2017-03-09 VITALS — BP 116/74 | HR 91 | Wt 211.0 lb

## 2017-03-09 DIAGNOSIS — Z3009 Encounter for other general counseling and advice on contraception: Secondary | ICD-10-CM | POA: Diagnosis not present

## 2017-03-09 DIAGNOSIS — N920 Excessive and frequent menstruation with regular cycle: Secondary | ICD-10-CM | POA: Diagnosis not present

## 2017-03-09 DIAGNOSIS — D508 Other iron deficiency anemias: Secondary | ICD-10-CM

## 2017-03-09 NOTE — Progress Notes (Addendum)
[facility] Visit  _0 @            Patient name: Penny [name] MRN [id number]  Date of birth: 1986-03-10  CC & HPI:  Penny [name] is a 31 y.o. female presenting today for discussion of surgical options for management of menorrhagia. Per chart review, patient has to obtain month iron transfusions and would like permanent sterilization. We had a lengthy discussion over whether her chronic anemia status post gastric bypass would be better handled by hysterectomy or endometrial ablation.. The patient's mother is here as support person in helping her understand the options. Significant is that the mother had breast cancer and has reviewed her history and it turns out that the mother is not NOT BRCA1 or BRCA2 carrier therefore endometrial ablation and tubal ligation are good options. The patient is aware that Medicaid does not at present cover charges for salpingectomy for sterilization. She requests that we check see if salpingectomy as an option in her circumstance.  She reports vaginal deliveries2.  ROS:  ROS +menorrhagia  + pelvic pain   Pertinent History Reviewed:   Reviewed: Significant for gastric sleeve Medical         Past Medical History:  Diagnosis Date  . ADD (attention deficit disorder)   . Anemia 2011   2o to GASTRIC BYPASS  . Anginal pain ([place])   . Anxiety   . Blood transfusion without reported diagnosis   . Chronic daily headache   . Depression   . Depression   . Dysmenorrhea 09/18/2013  . Dysrhythmia   . Elevated liver enzymes JUL 2011ALK PHOS 111-127 AST  143-267 ALT  213-321T BILI 0.6  ALB  3.7-4.06 Jun 2011 ALK PHOS 118 AST 24 ALT 42 T BILI 0.4 ALB 3.9  . Encounter for drug rehabilitation    at behavioral health for opioid addiction about 4 years ago  . Family history of adverse reaction to anesthesia    'dad had to be kept on pump for breathing for morphine'  . Fatty liver   . Fibroid 01/18/2017  . Fibromyalgia   . Gastric bypass status for  obesity   . Gastritis JULY 2011  . Heart murmur   . 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 History of Holter monitoring   . Hx of opioid abuse    for about 4 years, about 4 years ago  . Interstitial cystitis   . Iron deficiency anemia 07/23/2010  . Irritable bowel syndrome 2012 DIARRHEA   JUN 2012 TTG IgA 14.9  . IUD FEB 2010  . Lupus   . Menorrhagia 07/18/2013  . Migraines   . Obesity (BMI 30-39.9) 2011 228 LBS BMI 36.8  . Ovarian cyst   . Patient desires pregnancy 09/18/2013  . Polyneuropathy ([place])   . PONV (postoperative nausea and vomiting) seizure post-operatively  . Potassium (K) deficiency   . Pregnant 12/25/2013  . Psychiatric pseudoseizure   . RLQ abdominal pain 07/18/2013  . Sciatica of left side 11/14/2014  . 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 Stress 09/18/2013                              Surgical Hx:    Past Surgical History:  Procedure Laterality Date  . BIOPSY  04/10/2015   Procedure: BIOPSY;  Surgeon: Daneil Dolin, MD;  Location: [facility];  Service: Endoscopy;;  . cath self every  nite     for sodium bicarb injection (discontinued 2013)  . CHOLECYSTECTOMY  2005   biliary dyskinesia  . COLONOSCOPY  JUN 2012 ABD PN/DIARRHEA WITH PROPOFOL   NL COLON  . DILATION AND CURETTAGE OF UTERUS    . ESOPHAGEAL DILATION N/A 04/10/2015   Procedure: ESOPHAGEAL DILATION WITH 54FR MALONEY DILATOR;  Surgeon: Daneil Dolin, MD;  Location: [facility];  Service: Endoscopy;  Laterality: N/A;  . ESOPHAGOGASTRODUODENOSCOPY    . ESOPHAGOGASTRODUODENOSCOPY (EGD) WITH PROPOFOL N/A 04/10/2015   Procedure: ESOPHAGOGASTRODUODENOSCOPY (EGD) WITH PROPOFOL;  Surgeon: Daneil Dolin, MD;  Location: [facility];  Service: Endoscopy;  Laterality: N/A;  . ESOPHAGOGASTRODUODENOSCOPY (EGD) WITH PROPOFOL N/A 12/06/2016   Procedure: ESOPHAGOGASTRODUODENOSCOPY (EGD) WITH PROPOFOL;  Surgeon: Danie Binder, MD;  Location: [facility];  Service: Endoscopy;  Laterality: N/A;  . GAB   2007   in [place]-POUCH 5 CM  . GASTRIC BYPASS  06/2006  . HYSTEROSCOPY W/D&C N/A 09/12/2014   Procedure: DILATATION AND CURETTAGE /HYSTEROSCOPY;  Surgeon: Jonnie Kind, MD;  Location: [facility];  Service: Gynecology;  Laterality: N/A;  . REPAIR VAGINAL CUFF N/A 07/30/2014   Procedure: REPAIR VAGINAL CUFF;  Surgeon: Mora Bellman, MD;  Location: [facility];  Service: Gynecology;  Laterality: N/A;  . SAVORY DILATION  06/20/2012   Dr. Barnie Alderman gastritis/Ulcer in the mid jejunum. Empiric dilation.   . SURAL NERVE BX Left 02/25/2016   Procedure: LEFT SURAL NERVE BIOPSY;  Surgeon: Jovita Gamma, MD;  Location: [facility];  Service: Neurosurgery;  Laterality: Left;  Left sural nerve biopsy  . TONSILLECTOMY    . TONSILLECTOMY AND ADENOIDECTOMY    . UPPER GASTROINTESTINAL ENDOSCOPY  JULY 2011 NAUSEA-D125,V6, PH 25   Bx; GASTRITIS, POUCH-5 CM LONG  . WISDOM TOOTH EXTRACTION     Medications: Reviewed & Updated - see associated section                       Current Outpatient Prescriptions:  .  albuterol (PROVENTIL HFA;VENTOLIN HFA) 108 (90 Base) MCG/ACT inhaler, Inhale 2 puffs into the lungs every 6 (six) hours as needed for wheezing., Disp: 1 Inhaler, Rfl: 2 .  amphetamine-dextroamphetamine (ADDERALL XR) 30 MG 24 hr capsule, Take 1 capsule (30 mg total) by mouth daily. Fill after 03/12/17, Disp: 30 capsule, Rfl: 0 .  Butalbital-APAP-Caffeine 50-300-40 MG CAPS, Take 1 every 6 hours prn migraine, Disp: 60 capsule, Rfl: 2 .  carbamazepine (TEGRETOL) 200 MG tablet, Take two tablets at bedtime, Disp: 60 tablet, Rfl: 2 .  clonazePAM (KLONOPIN) 0.5 MG tablet, Take 1 tablet (0.5 mg total) by mouth 3 (three) times daily., Disp: 90 tablet, Rfl: 2 .  cyclobenzaprine (FLEXERIL) 10 MG tablet, Take 1 tablet (10 mg total) by mouth 3 (three) times daily., Disp: 30 tablet, Rfl: 0 .  DULoxetine (CYMBALTA) 60 MG capsule, Take 1 capsule (60 mg total) by mouth 2 (two) times daily., Disp: 60 capsule, Rfl: 2 .   HYDROcodone-acetaminophen (NORCO) 10-325 MG tablet, Take 1 tablet by mouth every 6 (six) hours as needed., Disp: 30 tablet, Rfl: 0 .  Pantoprazole Sodium (PROTONIX PO), Take 40 mg by mouth daily., Disp: , Rfl:  .  Pediatric Multivitamins-Iron (FLINTSTONES PLUS IRON PO), Take by mouth daily., Disp: , Rfl:  .  promethazine (PHENERGAN) 25 MG tablet, Take 1 tablet (25 mg total) by mouth every 6 (six) hours as needed for nausea or vomiting., Disp: 30 tablet, Rfl: 1 .  topiramate (TOPAMAX) 50 MG  tablet, Taking 1 in QAm and 2 QHS, Disp: 90 tablet, Rfl: 2 .  vitamin B-12 (CYANOCOBALAMIN) 100 MCG tablet, Take 100 mcg by mouth daily., Disp: , Rfl:  .  ferumoxytol (FERAHEME) 510 MG/17ML SOLN injection, Inject 17 mLs (510 mg total) into the vein once., Disp: 17 mL, Rfl: 0   Social History: Reviewed -  reports that she has quit smoking. Her smoking use included Cigarettes. She has a 1.50 pack-year smoking history. She has never used smokeless tobacco.  Objective Findings:  Vitals: Blood pressure 116/74, pulse 91, weight 211 lb (95.7 kg), last menstrual period 02/21/2017.  Physical Examination: General appearance - alert, well appearing, and in no distress Mental status - alert, oriented to person, place, and time Pelvic - examination not indicated   Assessment & Plan:   A:  1. Menorrhagia  2. Desire for sterilization  P:  1. BTL (possible salpingectomy) and endometrial ablation to schedule for 03/22/17 2. Call patient tomorrowTo confirm that she is comfortable with this plan The provider spent over 25 minutes with the visit , including previsit review, and documentation,with >than 50% spent in counseling and coordination of care.    By signing my name below, I, Sonum Patel, attest that this documentation has been prepared under the direction and in the presence of Jonnie Kind, MD. Electronically Signed: Sonum Patel, Education administrator. 03/09/17. 4:06 PM.  I personally performed the services described in  this documentation, which was SCRIBED in my presence. The recorded information has been reviewed and considered accurate. It has been edited as necessary during review. Jonnie Kind, MD  .Alecia Lemming

## 2017-03-11 ENCOUNTER — Encounter (HOSPITAL_COMMUNITY): Admission: RE | Admit: 2017-03-11 | Payer: Medicaid Other | Source: Ambulatory Visit

## 2017-03-18 ENCOUNTER — Telehealth: Payer: Self-pay | Admitting: Obstetrics and Gynecology

## 2017-03-18 ENCOUNTER — Encounter (HOSPITAL_COMMUNITY)
Admission: RE | Admit: 2017-03-18 | Discharge: 2017-03-18 | Disposition: A | Discharge status: Home / Self Care | Payer: Medicaid Other | Source: Ambulatory Visit | Attending: Internal Medicine | Admitting: Internal Medicine

## 2017-03-18 DIAGNOSIS — D508 Other iron deficiency anemias: Secondary | ICD-10-CM | POA: Insufficient documentation

## 2017-03-18 MED ORDER — SODIUM CHLORIDE 0.9 % IV SOLN
INTRAVENOUS | Status: DC
  Administered 2017-03-18: 13:00:00 via INTRAVENOUS

## 2017-03-18 MED ORDER — SODIUM CHLORIDE 0.9 % IV SOLN
750.0000 mg | Freq: Once | INTRAVENOUS | Status: AC
  Administered 2017-03-18: 750 mg via INTRAVENOUS
  Filled 2017-03-18: qty 15

## 2017-03-21 ENCOUNTER — Telehealth (HOSPITAL_COMMUNITY): Payer: Self-pay | Admitting: *Deleted

## 2017-03-21 ENCOUNTER — Ambulatory Visit (HOSPITAL_COMMUNITY): Payer: Self-pay | Admitting: Psychiatry

## 2017-03-21 NOTE — Telephone Encounter (Signed)
Please advise on No Shows: [name]: 03/21/17; 03/17/17; 06/16/16; 11/25/15; 11/12/15; 09/24/15; 08/06/15; 06/10/15  Dr. Harrington Challenger: 02/08/17; 07/14/16; 06/22/16; 01/21/16; 08/01/15.

## 2017-03-22 ENCOUNTER — Encounter (HOSPITAL_COMMUNITY): Payer: Self-pay

## 2017-03-22 ENCOUNTER — Encounter (HOSPITAL_COMMUNITY)
Admission: RE | Admit: 2017-03-22 | Discharge: 2017-03-22 | Disposition: A | Discharge status: Home / Self Care | Payer: Medicaid Other | Source: Ambulatory Visit | Attending: Obstetrics and Gynecology | Admitting: Obstetrics and Gynecology

## 2017-03-22 ENCOUNTER — Other Ambulatory Visit: Payer: Self-pay | Admitting: Obstetrics and Gynecology

## 2017-03-22 DIAGNOSIS — F988 Other specified behavioral and emotional disorders with onset usually occurring in childhood and adolescence: Secondary | ICD-10-CM | POA: Insufficient documentation

## 2017-03-22 DIAGNOSIS — Z9049 Acquired absence of other specified parts of digestive tract: Secondary | ICD-10-CM | POA: Diagnosis not present

## 2017-03-22 DIAGNOSIS — N920 Excessive and frequent menstruation with regular cycle: Secondary | ICD-10-CM | POA: Diagnosis not present

## 2017-03-22 DIAGNOSIS — Z9884 Bariatric surgery status: Secondary | ICD-10-CM | POA: Insufficient documentation

## 2017-03-22 DIAGNOSIS — Z79899 Other long term (current) drug therapy: Secondary | ICD-10-CM | POA: Diagnosis not present

## 2017-03-22 DIAGNOSIS — F329 Major depressive disorder, single episode, unspecified: Secondary | ICD-10-CM | POA: Diagnosis not present

## 2017-03-22 DIAGNOSIS — Z01812 Encounter for preprocedural laboratory examination: Secondary | ICD-10-CM | POA: Insufficient documentation

## 2017-03-22 DIAGNOSIS — D649 Anemia, unspecified: Secondary | ICD-10-CM | POA: Diagnosis not present

## 2017-03-22 DIAGNOSIS — F419 Anxiety disorder, unspecified: Secondary | ICD-10-CM | POA: Insufficient documentation

## 2017-03-22 DIAGNOSIS — Z9889 Other specified postprocedural states: Secondary | ICD-10-CM | POA: Diagnosis not present

## 2017-03-22 LAB — COMPREHENSIVE METABOLIC PANEL
ALK PHOS: 200 U/L — AB (ref 38–126)
ALT: 141 U/L — AB (ref 14–54)
AST: 72 U/L — ABNORMAL HIGH (ref 15–41)
Albumin: 3.9 g/dL (ref 3.5–5.0)
Anion gap: 5 (ref 5–15)
BUN: 12 mg/dL (ref 6–20)
CO2: 26 mmol/L (ref 22–32)
Calcium: 8.9 mg/dL (ref 8.9–10.3)
Chloride: 107 mmol/L (ref 101–111)
Creatinine, Ser: 0.63 mg/dL (ref 0.44–1.00)
Glucose, Bld: 100 mg/dL — ABNORMAL HIGH (ref 65–99)
Potassium: 4.1 mmol/L (ref 3.5–5.1)
Sodium: 138 mmol/L (ref 135–145)
TOTAL PROTEIN: 6.6 g/dL (ref 6.5–8.1)
Total Bilirubin: 0.3 mg/dL (ref 0.3–1.2)

## 2017-03-22 LAB — CBC
HCT: 35.2 % — ABNORMAL LOW (ref 36.0–46.0)
Hemoglobin: 11.6 g/dL — ABNORMAL LOW (ref 12.0–15.0)
MCH: 31.8 pg (ref 26.0–34.0)
MCHC: 33 g/dL (ref 30.0–36.0)
MCV: 96.4 fL (ref 78.0–100.0)
Platelets: 254 10*3/uL (ref 150–400)
RBC: 3.65 MIL/uL — AB (ref 3.87–5.11)
RDW: 13.6 % (ref 11.5–15.5)
WBC: 6.3 10*3/uL (ref 4.0–10.5)

## 2017-03-22 LAB — HCG, SERUM, QUALITATIVE: PREG SERUM: NEGATIVE

## 2017-03-22 NOTE — Telephone Encounter (Signed)
Please advise on No Shows: [name]: 03/21/17; 03/17/17; 06/16/16; 11/25/15; 11/12/15; 09/24/15; 08/06/15; 06/10/15  Dr. Harrington Challenger: 02/08/17; 07/14/16; 06/22/16; 01/21/16; 08/01/15. Please advise staff.

## 2017-03-22 NOTE — Telephone Encounter (Signed)
She can have one more with me and that's it

## 2017-03-22 NOTE — Patient Instructions (Signed)
Penny [name]  03/22/2017     @PREFPERIOPPHARMACY @   Your procedure is scheduled on 03/24/2017.  Report to [facility] at 9:00 A.M.  Call this number if you have problems the morning of surgery:  [phone]   Remember:  Do not eat food or drink liquids after midnight.  Take these medicines the morning of surgery with A SIP OF WATER Albuterol inhaler and bring with you, Adderall, Tegretol, Klonopin, Flexeril if needed  Topamax, Cymbalta, Hydrocodone OR Oxycodone if needed, Protonix, Phenergan if needed   Do not wear jewelry, make-up or nail polish.  Do not wear lotions, powders, or perfumes, or deoderant.  Do not shave 48 hours prior to surgery.  Men may shave face and neck.  Do not bring valuables to the hospital.  [facility] is not responsible for any belongings or valuables.  Contacts, dentures or bridgework may not be worn into surgery.  Leave your suitcase in the car.  After surgery it may be brought to your room.  For patients admitted to the hospital, discharge time will be determined by your treatment team.  Patients discharged the day of surgery will not be allowed to drive home.    Please read over the following fact sheets that you were given. Surgical Site Infection Prevention and Anesthesia Post-op Instructions     PATIENT INSTRUCTIONS POST-ANESTHESIA  IMMEDIATELY FOLLOWING SURGERY:  Do not drive or operate machinery for the first twenty four hours after surgery.  Do not make any important decisions for twenty four hours after surgery or while taking narcotic pain medications or sedatives.  If you develop intractable nausea and vomiting or a severe headache please notify your doctor immediately.  FOLLOW-UP:  Please make an appointment with your surgeon as instructed. You do not need to follow up with anesthesia unless specifically instructed to do so.  WOUND CARE INSTRUCTIONS (if applicable):  Keep a dry clean dressing on the anesthesia/puncture wound site if  there is drainage.  Once the wound has quit draining you may leave it open to air.  Generally you should leave the bandage intact for twenty four hours unless there is drainage.  If the epidural site drains for more than 36-48 hours please call the anesthesia department.  QUESTIONS?:  Please feel free to call your physician or the hospital operator if you have any questions, and they will be happy to assist you.      Salpingectomy Salpingectomy, also called tubectomy, is the surgical removal of one of the fallopian tubes. The fallopian tubes are where eggs travel from the ovaries to the uterus. Removing one fallopian tube does not prevent you from becoming pregnant. It also does not cause problems with your menstrual periods. You may need a salpingectomy if you:  Have a fertilized egg that attaches to the fallopian tube (ectopic pregnancy), especially one that causes the tube to burst or tear (rupture).  Have an infected fallopian tube.  Have cancer of the fallopian tube or nearby organs.  Have had an ovary removed due to a cyst or tumor.  Have had your uterus removed. There are three different methods that can be used for a salpingectomy:  Open. This method involves making one large incision in your abdomen.  Laparoscopic. This method involves using a thin, lighted tube with a tiny camera on the end (laparoscope) to help perform the procedure. The laparoscope will allow your surgeon to make several small incisions in the abdomen instead of a large incision.  Robot-assisted:  This method involves using a computer to control surgical instruments that are attached to robotic arms. Tell a health care provider about:  Any allergies you have.  All medicines you are taking, including vitamins, herbs, eye drops, creams, and over-the-counter medicines.  Any problems you or family members have had with anesthetic medicines.  Any blood disorders you have.  Any surgeries you have had.  Any  medical conditions you have.  Whether you are pregnant or may be pregnant. What are the risks? Generally, this is a safe procedure. However, problems may occur, including:  Infection.  Bleeding.  Allergic reactions to medicines.  Damage to other structures or organs.  Blood clots in the legs or lungs. What happens before the procedure? Staying hydrated  Follow instructions from your health care provider about hydration, which may include:  Up to 2 hours before the procedure - you may continue to drink clear liquids, such as water, clear fruit juice, black coffee, and plain tea. Eating and drinking restrictions  Follow instructions from your health care provider about eating and drinking, which may include:  8 hours before the procedure - stop eating heavy meals or foods such as meat, fried foods, or fatty foods.  6 hours before the procedure - stop eating light meals or foods, such as toast or cereal.  6 hours before the procedure - stop drinking milk or drinks that contain milk.  2 hours before the procedure - stop drinking clear liquids. Medicines   Ask your health care provider about:  Changing or stopping your regular medicines. This is especially important if you are taking diabetes medicines or blood thinners.  Taking medicines such as aspirin and ibuprofen. These medicines can thin your blood. Do not take these medicines before your procedure if your health care provider instructs you not to.  You may be given antibiotic medicine to help prevent infection. General instructions   Do not smoke for at least 2 weeks before your procedure. If you need help quitting, ask your health care provider.  You may have an exam or tests, such as an electrocardiogram (ECG).  You may have a blood or urine sample taken.  Ask your health care provider:  Whether you should stop removing hair from your surgical area.  How your surgical site will be marked or identified.  You  may be asked to shower with a germ-killing soap.  Plan to have someone take you home from the hospital or clinic.  If you will be going home right after the procedure, plan to have someone with you for 24 hours. What happens during the procedure?  To reduce your risk of infection:  Your health care team will wash or sanitize their hands.  Hair may be removed from the surgical area.  Your skin will be washed with soap.  An IV tube will be inserted into one of your veins.  You will be given a medicine to make you fall asleep (general anesthetic). You may also be given a medicine to help you relax (sedative).  A thin tube (catheter) may be inserted through your urethra and into your bladder to drain urine during your procedure.  Depending on the type of procedure you are having, one incision or several small incisions will be made in your abdomen.  Your fallopian tube will be cut and removed from where it attaches to your uterus.  Your blood vessels will be clamped and tied to prevent excess bleeding.  The incision(s) in your abdomen will  be closed with stitches (sutures), staples, or skin glue.  A bandage (dressing) may be placed over your incision(s). The procedure may vary among health care providers and hospitals. What happens after the procedure?  Your blood pressure, heart rate, breathing rate, and blood oxygen level will be monitored until the medicines you were given have worn off.  You may continue to receive fluids and medicines through an IV tube.  You may continue to have a catheter draining your urine.  You may have to wear compression stockings. These stockings help to prevent blood clots and reduce swelling in your legs.  You will be given pain medicine as needed.  Do not drive for 24 hours if you received a sedative. Summary  Salpingectomy is a surgical procedure to remove one of the fallopian tubes.  The procedure may be done with an open incision, with a  laparoscope, or with computer-controlled instruments.  Depending on the type of procedure you are having, one incision or several small incisions will be made in your abdomen.  Your blood pressure, heart rate, breathing rate, and blood oxygen level will be monitored until the medicines you were given have worn off.  Plan to have someone take you home from the hospital or clinic. This information is not intended to replace advice given to you by your health care provider. Make sure you discuss any questions you have with your health care provider. Document Released: 04/10/2009 Document Revised: 07/09/2016 Document Reviewed: 05/16/2013 Elsevier Interactive Patient Education  2017 [employer].

## 2017-03-24 ENCOUNTER — Ambulatory Visit (HOSPITAL_COMMUNITY): Payer: Medicaid Other | Admitting: Anesthesiology

## 2017-03-24 ENCOUNTER — Other Ambulatory Visit: Payer: Self-pay | Admitting: Obstetrics and Gynecology

## 2017-03-24 ENCOUNTER — Ambulatory Visit (HOSPITAL_COMMUNITY)
Admission: RE | Admit: 2017-03-24 | Discharge: 2017-03-24 | Disposition: A | Discharge status: Home / Self Care | Payer: Medicaid Other | Source: Ambulatory Visit | Attending: Obstetrics and Gynecology | Admitting: Obstetrics and Gynecology

## 2017-03-24 ENCOUNTER — Encounter (HOSPITAL_COMMUNITY)
Admission: RE | Disposition: A | Discharge status: Home / Self Care | Payer: Self-pay | Source: Ambulatory Visit | Attending: Obstetrics and Gynecology

## 2017-03-24 ENCOUNTER — Encounter (HOSPITAL_COMMUNITY): Payer: Self-pay

## 2017-03-24 ENCOUNTER — Telehealth: Payer: Self-pay | Admitting: Obstetrics and Gynecology

## 2017-03-24 DIAGNOSIS — Z6834 Body mass index (BMI) 34.0-34.9, adult: Secondary | ICD-10-CM | POA: Insufficient documentation

## 2017-03-24 DIAGNOSIS — M5432 Sciatica, left side: Secondary | ICD-10-CM | POA: Insufficient documentation

## 2017-03-24 DIAGNOSIS — G609 Hereditary and idiopathic neuropathy, unspecified: Secondary | ICD-10-CM | POA: Diagnosis not present

## 2017-03-24 DIAGNOSIS — I499 Cardiac arrhythmia, unspecified: Secondary | ICD-10-CM | POA: Insufficient documentation

## 2017-03-24 DIAGNOSIS — Z9049 Acquired absence of other specified parts of digestive tract: Secondary | ICD-10-CM | POA: Insufficient documentation

## 2017-03-24 DIAGNOSIS — I10 Essential (primary) hypertension: Secondary | ICD-10-CM | POA: Insufficient documentation

## 2017-03-24 DIAGNOSIS — F329 Major depressive disorder, single episode, unspecified: Secondary | ICD-10-CM | POA: Diagnosis not present

## 2017-03-24 DIAGNOSIS — K760 Fatty (change of) liver, not elsewhere classified: Secondary | ICD-10-CM | POA: Diagnosis not present

## 2017-03-24 DIAGNOSIS — F988 Other specified behavioral and emotional disorders with onset usually occurring in childhood and adolescence: Secondary | ICD-10-CM | POA: Diagnosis not present

## 2017-03-24 DIAGNOSIS — R011 Cardiac murmur, unspecified: Secondary | ICD-10-CM | POA: Diagnosis not present

## 2017-03-24 DIAGNOSIS — N3090 Cystitis, unspecified without hematuria: Secondary | ICD-10-CM | POA: Insufficient documentation

## 2017-03-24 DIAGNOSIS — R51 Headache: Secondary | ICD-10-CM | POA: Insufficient documentation

## 2017-03-24 DIAGNOSIS — Z302 Encounter for sterilization: Secondary | ICD-10-CM

## 2017-03-24 DIAGNOSIS — F419 Anxiety disorder, unspecified: Secondary | ICD-10-CM | POA: Insufficient documentation

## 2017-03-24 DIAGNOSIS — M329 Systemic lupus erythematosus, unspecified: Secondary | ICD-10-CM | POA: Insufficient documentation

## 2017-03-24 DIAGNOSIS — M797 Fibromyalgia: Secondary | ICD-10-CM | POA: Diagnosis not present

## 2017-03-24 DIAGNOSIS — D509 Iron deficiency anemia, unspecified: Secondary | ICD-10-CM | POA: Diagnosis present

## 2017-03-24 DIAGNOSIS — R748 Abnormal levels of other serum enzymes: Secondary | ICD-10-CM | POA: Insufficient documentation

## 2017-03-24 DIAGNOSIS — R569 Unspecified convulsions: Secondary | ICD-10-CM | POA: Diagnosis not present

## 2017-03-24 DIAGNOSIS — N946 Dysmenorrhea, unspecified: Secondary | ICD-10-CM | POA: Diagnosis not present

## 2017-03-24 DIAGNOSIS — Z9884 Bariatric surgery status: Secondary | ICD-10-CM | POA: Insufficient documentation

## 2017-03-24 DIAGNOSIS — N83209 Unspecified ovarian cyst, unspecified side: Secondary | ICD-10-CM | POA: Diagnosis not present

## 2017-03-24 DIAGNOSIS — N920 Excessive and frequent menstruation with regular cycle: Secondary | ICD-10-CM | POA: Diagnosis present

## 2017-03-24 DIAGNOSIS — Z79899 Other long term (current) drug therapy: Secondary | ICD-10-CM | POA: Insufficient documentation

## 2017-03-24 DIAGNOSIS — F418 Other specified anxiety disorders: Secondary | ICD-10-CM | POA: Insufficient documentation

## 2017-03-24 DIAGNOSIS — E669 Obesity, unspecified: Secondary | ICD-10-CM | POA: Insufficient documentation

## 2017-03-24 DIAGNOSIS — E876 Hypokalemia: Secondary | ICD-10-CM | POA: Diagnosis not present

## 2017-03-24 DIAGNOSIS — D649 Anemia, unspecified: Secondary | ICD-10-CM | POA: Insufficient documentation

## 2017-03-24 DIAGNOSIS — K589 Irritable bowel syndrome without diarrhea: Secondary | ICD-10-CM | POA: Insufficient documentation

## 2017-03-24 DIAGNOSIS — Z87891 Personal history of nicotine dependence: Secondary | ICD-10-CM | POA: Insufficient documentation

## 2017-03-24 HISTORY — PX: DILITATION & CURRETTAGE/HYSTROSCOPY WITH NOVASURE ABLATION: SHX5568

## 2017-03-24 HISTORY — PX: LAPAROSCOPIC TUBAL LIGATION: SHX1937

## 2017-03-24 SURGERY — LIGATION, FALLOPIAN TUBE, LAPAROSCOPIC
Anesthesia: General

## 2017-03-24 MED ORDER — MIDAZOLAM HCL 2 MG/2ML IJ SOLN
INTRAMUSCULAR | Status: AC
  Filled 2017-03-24: qty 2

## 2017-03-24 MED ORDER — HYDROMORPHONE HCL 1 MG/ML IJ SOLN
0.2500 mg | INTRAMUSCULAR | Status: DC | PRN
  Administered 2017-03-24 (×4): 0.5 mg via INTRAVENOUS
  Filled 2017-03-24 (×2): qty 1

## 2017-03-24 MED ORDER — HYDROCODONE-ACETAMINOPHEN 10-325 MG PO TABS: 1.0000 | ORAL_TABLET | Freq: Four times a day (QID) | ORAL | 0 refills | Status: DC | PRN

## 2017-03-24 MED ORDER — GLYCOPYRROLATE 0.2 MG/ML IJ SOLN
INTRAMUSCULAR | Status: DC | PRN
  Administered 2017-03-24: 0.2 mg via INTRAVENOUS

## 2017-03-24 MED ORDER — ROCURONIUM BROMIDE 50 MG/5ML IV SOLN
INTRAVENOUS | Status: AC
  Filled 2017-03-24: qty 1

## 2017-03-24 MED ORDER — ROCURONIUM 10MG/ML (10ML) SYRINGE FOR MEDFUSION PUMP - OPTIME
INTRAVENOUS | Status: DC | PRN
  Administered 2017-03-24: 25 mg via INTRAVENOUS
  Administered 2017-03-24: 5 mg via INTRAVENOUS

## 2017-03-24 MED ORDER — CEFAZOLIN SODIUM-DEXTROSE 2-4 GM/100ML-% IV SOLN
2.0000 g | INTRAVENOUS | Status: DC
  Filled 2017-03-24: qty 100

## 2017-03-24 MED ORDER — FENTANYL CITRATE (PF) 100 MCG/2ML IJ SOLN
INTRAMUSCULAR | Status: DC | PRN
  Administered 2017-03-24 (×5): 50 ug via INTRAVENOUS

## 2017-03-24 MED ORDER — SUCCINYLCHOLINE 20MG/ML (10ML) SYRINGE FOR MEDFUSION PUMP - OPTIME
INTRAMUSCULAR | Status: DC | PRN
  Administered 2017-03-24: 120 mg via INTRAVENOUS

## 2017-03-24 MED ORDER — LIDOCAINE HCL (CARDIAC) 10 MG/ML IV SOLN
INTRAVENOUS | Status: DC | PRN
  Administered 2017-03-24: 40 mg via INTRAVENOUS

## 2017-03-24 MED ORDER — FENTANYL CITRATE (PF) 100 MCG/2ML IJ SOLN
25.0000 ug | INTRAMUSCULAR | Status: AC
  Administered 2017-03-24 (×2): 25 ug via INTRAVENOUS

## 2017-03-24 MED ORDER — PROPOFOL 10 MG/ML IV BOLUS
INTRAVENOUS | Status: AC
  Filled 2017-03-24: qty 20

## 2017-03-24 MED ORDER — PROPOFOL 10 MG/ML IV BOLUS
INTRAVENOUS | Status: DC | PRN
  Administered 2017-03-24: 180 mg via INTRAVENOUS

## 2017-03-24 MED ORDER — MIDAZOLAM HCL 2 MG/2ML IJ SOLN
1.0000 mg | INTRAMUSCULAR | Status: AC
  Administered 2017-03-24 (×2): 2 mg via INTRAVENOUS
  Filled 2017-03-24: qty 2

## 2017-03-24 MED ORDER — SUCCINYLCHOLINE CHLORIDE 20 MG/ML IJ SOLN
INTRAMUSCULAR | Status: AC
  Filled 2017-03-24: qty 1

## 2017-03-24 MED ORDER — SODIUM CHLORIDE 0.9 % IR SOLN
Status: DC | PRN
  Administered 2017-03-24: 3000 mL

## 2017-03-24 MED ORDER — BUPIVACAINE-EPINEPHRINE (PF) 0.5% -1:200000 IJ SOLN
INTRAMUSCULAR | Status: AC
  Filled 2017-03-24: qty 30

## 2017-03-24 MED ORDER — LACTATED RINGERS IV SOLN
INTRAVENOUS | Status: DC
  Administered 2017-03-24 (×2): via INTRAVENOUS

## 2017-03-24 MED ORDER — FENTANYL CITRATE (PF) 250 MCG/5ML IJ SOLN
INTRAMUSCULAR | Status: AC
  Filled 2017-03-24: qty 5

## 2017-03-24 MED ORDER — BUPIVACAINE-EPINEPHRINE (PF) 0.5% -1:200000 IJ SOLN
INTRAMUSCULAR | Status: DC | PRN
  Administered 2017-03-24: 6 mL
  Administered 2017-03-24: 20 mL
  Administered 2017-03-24: 4 mL

## 2017-03-24 MED ORDER — 0.9 % SODIUM CHLORIDE (POUR BTL) OPTIME
TOPICAL | Status: DC | PRN
  Administered 2017-03-24: 1000 mL

## 2017-03-24 MED ORDER — FENTANYL CITRATE (PF) 100 MCG/2ML IJ SOLN
INTRAMUSCULAR | Status: AC
  Filled 2017-03-24: qty 2

## 2017-03-24 MED ORDER — NEOSTIGMINE METHYLSULFATE 10 MG/10ML IV SOLN
INTRAVENOUS | Status: DC | PRN
  Administered 2017-03-24: 2 mg via INTRAVENOUS

## 2017-03-24 MED ORDER — LIDOCAINE HCL (PF) 1 % IJ SOLN
INTRAMUSCULAR | Status: AC
  Filled 2017-03-24: qty 5

## 2017-03-24 SURGICAL SUPPLY — 47 items
ABLATOR ENDOMETRIAL BIPOLAR (ABLATOR) ×3 IMPLANT
BAG HAMPER (MISCELLANEOUS) ×3 IMPLANT
BANDAGE STRIP 1X3 FLEXIBLE (GAUZE/BANDAGES/DRESSINGS) ×6 IMPLANT
BLADE SURG SZ11 CARB STEEL (BLADE) ×3 IMPLANT
CATH ROBINSON RED A/P 16FR (CATHETERS) ×2 IMPLANT
CLOTH BEACON ORANGE TIMEOUT ST (SAFETY) ×3 IMPLANT
COVER LIGHT HANDLE STERIS (MISCELLANEOUS) ×6 IMPLANT
DECANTER SPIKE VIAL GLASS SM (MISCELLANEOUS) ×3 IMPLANT
DURAPREP 26ML APPLICATOR (WOUND CARE) ×3 IMPLANT
ELECT REM PT RETURN 9FT ADLT (ELECTROSURGICAL) ×3
ELECTRODE REM PT RTRN 9FT ADLT (ELECTROSURGICAL) ×2 IMPLANT
FORMALIN 10 PREFIL 120ML (MISCELLANEOUS) ×3 IMPLANT
GLOVE BIOGEL PI IND STRL 7.0 (GLOVE) ×2 IMPLANT
GLOVE BIOGEL PI IND STRL 9 (GLOVE) ×2 IMPLANT
GLOVE BIOGEL PI INDICATOR 7.0 (GLOVE) ×1
GLOVE BIOGEL PI INDICATOR 9 (GLOVE) ×1
GLOVE ECLIPSE 9.0 STRL (GLOVE) ×6 IMPLANT
GOWN SPEC L3 XXLG W/TWL (GOWN DISPOSABLE) ×3 IMPLANT
GOWN STRL REUS W/TWL LRG LVL3 (GOWN DISPOSABLE) ×3 IMPLANT
INST SET HYSTEROSCOPY (KITS) ×3 IMPLANT
INST SET LAPROSCOPIC GYN AP (KITS) ×3 IMPLANT
IV NS IRRIG 3000ML ARTHROMATIC (IV SOLUTION) ×3 IMPLANT
KIT ROOM TURNOVER AP CYSTO (KITS) ×3 IMPLANT
KIT ROOM TURNOVER APOR (KITS) ×3 IMPLANT
MANIFOLD NEPTUNE II (INSTRUMENTS) ×3 IMPLANT
NDL INSUFFLATION 14GA 120MM (NEEDLE) IMPLANT
NEEDLE INSUFFLATION 14GA 120MM (NEEDLE) ×3 IMPLANT
NS IRRIG 1000ML POUR BTL (IV SOLUTION) ×3 IMPLANT
PACK PERI GYN (CUSTOM PROCEDURE TRAY) ×3 IMPLANT
PAD ARMBOARD 7.5X6 YLW CONV (MISCELLANEOUS) ×3 IMPLANT
PAD TELFA 3X4 1S STER (GAUZE/BANDAGES/DRESSINGS) ×3 IMPLANT
RING FALLOPIAN BANDS (Ring) ×3 IMPLANT
SET BASIN LINEN APH (SET/KITS/TRAYS/PACK) ×3 IMPLANT
SET IRRIG Y TYPE TUR BLADDER L (SET/KITS/TRAYS/PACK) ×3 IMPLANT
SOLUTION ANTI FOG 6CC (MISCELLANEOUS) ×3 IMPLANT
SPONGE GAUZE 2X2 8PLY STRL LF (GAUZE/BANDAGES/DRESSINGS) ×1 IMPLANT
STRIP CLOSURE SKIN 1/4X3 (GAUZE/BANDAGES/DRESSINGS) ×3 IMPLANT
SUT VIC AB 4-0 PS2 27 (SUTURE) IMPLANT
SUT VICRYL 0 UR6 27IN ABS (SUTURE) ×1 IMPLANT
SYR BULB IRRIGATION 50ML (SYRINGE) ×3 IMPLANT
SYR CONTROL 10ML LL (SYRINGE) ×3 IMPLANT
SYRINGE 10CC LL (SYRINGE) ×6 IMPLANT
TAPE PAPER 1X10 WHT MICROPORE (GAUZE/BANDAGES/DRESSINGS) ×1 IMPLANT
TROCAR KII 8X100ML NONTHREADED (TROCAR) ×3 IMPLANT
TROCAR XCEL NON-BLD 5MMX100MML (ENDOMECHANICALS) ×3 IMPLANT
TUBING INSUFFLATION (TUBING) ×3 IMPLANT
WARMER LAPAROSCOPE (MISCELLANEOUS) ×3 IMPLANT

## 2017-03-24 NOTE — Telephone Encounter (Signed)
Penny [name] calls complaining that she needs a different pain medicine. She was written a prescription for her consent on the records as I understood it would stated that she would had been on oxycodone but was not currently taking it. According to her pharmacy she has just received a large prescription of oxycodone intended to last 30 days. The patient's story to [name] today is that she Is not taking it because it makes her nauseated. Nonetheless she has just received a 30 day supply of these opiates from her pain management clinic I spoken with [facility] and we've agreed that we will not give her any additional medicines. I have Called the patient and informed her that she has plenty of pain medicines at home and she should take the pills as tolerated. If she needs taking half a pill while having a full stomach in order to better tolerate the medicines that would be an option. I explained to her that I will not add to her current pain management, issues by adding additional prescriptions patient acknowledges understanding this rationale

## 2017-03-24 NOTE — Discharge Instructions (Signed)
Endometrial Ablation °Endometrial ablation is a procedure that destroys the thin inner layer of the lining of the uterus (endometrium). This procedure may be done: °· To stop heavy periods. °· To stop bleeding that is causing anemia. °· To control irregular bleeding. °· To treat bleeding caused by small tumors (fibroids) in the endometrium. ° °This procedure is often an alternative to major surgery, such as removal of the uterus and cervix (hysterectomy). As a result of this procedure: °· You may not be able to have children. However, if you are premenopausal (you have not gone through menopause): °? You may still have a small chance of getting pregnant. °? You will need to use a reliable method of birth control after the procedure to prevent pregnancy. °· You may stop having a menstrual period, or you may have only a small amount of bleeding during your period. Menstruation may return several years after the procedure. ° °Tell a health care provider about: °· Any allergies you have. °· All medicines you are taking, including vitamins, herbs, eye drops, creams, and over-the-counter medicines. °· Any problems you or family members have had with the use of anesthetic medicines. °· Any blood disorders you have. °· Any surgeries you have had. °· Any medical conditions you have. °What are the risks? °Generally, this is a safe procedure. However, problems may occur, including: °· A hole (perforation) in the uterus or bowel. °· Infection of the uterus, bladder, or vagina. °· Bleeding. °· Damage to other structures or organs. °· An air bubble in the lung (air embolus). °· Problems with pregnancy after the procedure. °· Failure of the procedure. °· Decreased ability to diagnose cancer in the endometrium. ° °What happens before the procedure? °· You will have tests of your endometrium to make sure there are no pre-cancerous cells or cancer cells present. °· You may have an ultrasound of the uterus. °· You may be given  medicines to thin the endometrium. °· Ask your health care provider about: °? Changing or stopping your regular medicines. This is especially important if you take diabetes medicines or blood thinners. °? Taking medicines such as aspirin and ibuprofen. These medicines can thin your blood. Do not take these medicines before your procedure if your doctor tells you not to. °· Plan to have someone take you home from the hospital or clinic. °What happens during the procedure? °· You will lie on an exam table with your feet and legs supported as in a pelvic exam. °· To lower your risk of infection: °? Your health care team will wash or sanitize their hands and put on germ-free (sterile) gloves. °? Your genital area will be washed with soap. °· An IV tube will be inserted into one of your veins. °· You will be given a medicine to help you relax (sedative). °· A surgical instrument with a light and camera (resectoscope) will be inserted into your vagina and moved into your uterus. This allows your surgeon to see inside your uterus. °· Endometrial tissue will be removed using one of the following methods: °? Radiofrequency. This method uses a radiofrequency-alternating electric current to remove the endometrium. °? Cryotherapy. This method uses extreme cold to freeze the endometrium. °? Heated-free liquid. This method uses a heated saltwater (saline) solution to remove the endometrium. °? Microwave. This method uses high-energy microwaves to heat up the endometrium and remove it. °? Thermal balloon. This method involves inserting a catheter with a balloon tip into the uterus. The balloon tip is   fluid to remove the endometrium. The procedure may vary among health care providers and hospitals. What happens after the procedure?  Your blood pressure, heart rate, breathing rate, and blood oxygen level will be monitored until the medicines you were given have worn off.  As tissue healing occurs, you may notice vaginal  bleeding for 4-6 weeks after the procedure. You may also experience:  Cramps.  Thin, watery vaginal discharge that is light pink or brown in color.  A need to urinate more frequently than usual.  Nausea.  Do not drive for 24 hours if you were given a sedative.  Do not have sex or insert anything into your vagina until your health care provider approves. Summary  Endometrial ablation is done to treat the many causes of heavy menstrual bleeding.  The procedure may be done only after medications have been tried to control the bleeding.  Plan to have someone take you home from the hospital or clinic. This information is not intended to replace advice given to you by your health care provider. Make sure you discuss any questions you have with your health care provider. Document Released: 10/01/2004 Document Revised: 12/09/2016 Document Reviewed: 12/09/2016 Elsevier Interactive Patient Education  2017 [employer]  Laparoscopic Tubal Ligation, Care After Refer to this sheet in the next few weeks. These instructions provide you with information about caring for yourself after your procedure. Your health care provider may also give you more specific instructions. Your treatment has been planned according to current medical practices, but problems sometimes occur. Call your health care provider if you have any problems or questions after your procedure. What can I expect after the procedure? After the procedure, it is common to have:  A sore throat.  Discomfort in your shoulder.  Mild discomfort or cramping in your abdomen.  Gas pains.  Pain or soreness in the area where the surgical cut (incision) was made.  A bloated feeling.  Tiredness.  Nausea.  Vomiting. Follow these instructions at home: Medicines   Take over-the-counter and prescription medicines only as told by your health care provider.  Do not take aspirin because it can cause bleeding.  Do not drive or operate  heavy machinery while taking prescription pain medicine. Activity   Rest for the rest of the day.  Return to your normal activities as told by your health care provider. Ask your health care provider what activities are safe for you. Incision care    Follow instructions from your health care provider about how to take care of your incision. Make sure you:  Wash your hands with soap and water before you change your bandage (dressing). If soap and water are not available, use hand sanitizer.  Change your dressing as told by your health care provider.  Leave stitches (sutures) in place. They may need to stay in place for 2 weeks or longer.  Check your incision area every day for signs of infection. Check for:  More redness, swelling, or pain.  More fluid or blood.  Warmth.  Pus or a bad smell. Other Instructions   Do not take baths, swim, or use a hot tub until your health care provider approves. You may take showers.  Keep all follow-up visits as told by your health care provider. This is important.  Have someone help you with your daily household tasks for the first few days. Contact a health care provider if:  You have more redness, swelling, or pain around your incision.  Your incision feels  warm to the touch.  You have pus or a bad smell coming from your incision.  The edges of your incision break open after the sutures have been removed.  Your pain does not improve after 2-3 days.  You have a rash.  You repeatedly become dizzy or light-headed.  Your pain medicine is not helping.  You are constipated. Get help right away if:  You have a fever.  You faint.  You have increasing pain in your abdomen.  You have severe pain in one or both of your shoulders.  You have fluid or blood coming from your sutures or from your vagina.  You have shortness of breath or difficulty breathing.  You have chest pain or leg pain.  You have ongoing nausea, vomiting, or  diarrhea. This information is not intended to replace advice given to you by your health care provider. Make sure you discuss any questions you have with your health care provider. Document Released: 06/11/2005 Document Revised: 04/26/2016 Document Reviewed: 11/02/2015 Elsevier Interactive Patient Education  2017 [employer].

## 2017-03-24 NOTE — Telephone Encounter (Signed)
Do not reschedule with me due to history of No shows.

## 2017-03-24 NOTE — Anesthesia Postprocedure Evaluation (Signed)
Anesthesia Post Note  [name]  Procedure(s) Performed: Procedure(s) (LRB): LAPAROSCOPIC TUBAL LIGATION (Falope Rings) (Bilateral) DILATATION & CURETTAGE/HYSTEROSCOPY WITH NOVASURE ENDOMETRIAL ABLATION (N/A)  Patient location during evaluation: PACU Anesthesia Type: General Level of consciousness: awake and patient cooperative Pain management: pain level controlled Vital Signs Assessment: post-procedure vital signs reviewed and stable Respiratory status: spontaneous breathing, nonlabored ventilation and respiratory function stable Cardiovascular status: blood pressure returned to baseline Postop Assessment: no signs of nausea or vomiting Anesthetic complications: no     Last Vitals:  Vitals:   03/24/17 1256 03/24/17 1300  BP:    Pulse: 87 81  Resp: 12 13  Temp:      Last Pain:  Vitals:   03/24/17 1300  TempSrc:   PainSc: 8                  Edelin Fryer J

## 2017-03-24 NOTE — Anesthesia Procedure Notes (Signed)
Procedure Name: Intubation Date/Time: 03/24/2017 11:16 AM Performed by: Tressie Stalker E Pre-anesthesia Checklist: Patient identified, Patient being monitored, Timeout performed, Emergency Drugs available and Suction available Patient Re-evaluated:Patient Re-evaluated prior to inductionOxygen Delivery Method: Circle System Utilized Preoxygenation: Pre-oxygenation with 100% oxygen Intubation Type: IV induction Ventilation: Mask ventilation without difficulty Laryngoscope Size: Mac and 3 Grade View: Grade I Tube type: Oral Tube size: 7.0 mm Number of attempts: 1 Airway Equipment and Method: stylet Placement Confirmation: ETT inserted through vocal cords under direct vision,  positive ETCO2 and breath sounds checked- equal and bilateral Secured at: 21 cm Tube secured with: Tape Dental Injury: Teeth and Oropharynx as per pre-operative assessment

## 2017-03-24 NOTE — Transfer of Care (Signed)
Immediate Anesthesia Transfer of Care Note  [name]  Procedure(s) Performed: Procedure(s): LAPAROSCOPIC TUBAL LIGATION (Falope Rings) (Bilateral) DILATATION & CURETTAGE/HYSTEROSCOPY WITH NOVASURE ENDOMETRIAL ABLATION (N/A)  Patient Location: PACU  Anesthesia Type:General  Level of Consciousness: awake and patient cooperative  Airway & Oxygen Therapy: Patient Spontanous Breathing and Patient connected to face mask oxygen  Post-op Assessment: Report given to RN, Post -op Vital signs reviewed and stable and Patient moving all extremities X 4  Post vital signs: Reviewed and stable  Last Vitals:  Vitals:   03/24/17 1055 03/24/17 1100  BP: 106/60 103/66  Pulse:    Resp: 13 11  Temp:      Last Pain:  Vitals:   03/24/17 1100  TempSrc:   PainSc: 6       Patients Stated Pain Goal: 5 (15/17/61 6073)  Complications: No apparent anesthesia complications

## 2017-03-24 NOTE — H&P (Signed)
CC & HPI:  Penny [name] is a 31 y.o. female presenting today for discussion of surgical options for management of menorrhagia. Per chart review, patient has to obtain month iron transfusions and would like permanent sterilization. We had a lengthy discussion over whether her chronic anemia status post gastric bypass would be better handled by hysterectomy or endometrial ablation.. The patient's mother is here as support person in helping her understand the options. Significant is that the mother had breast cancer and has reviewed her history and it turns out that the mother is not NOT BRCA1 or BRCA2 carrier therefore endometrial ablation and tubal ligation are good options. The patient is aware that Medicaid does not at present cover charges for salpingectomy for sterilization.   She reports vaginal deliveries2.  ROS:  ROS +menorrhagia  + pelvic pain   Pertinent History Reviewed:   Reviewed: Significant for gastric sleeve Medical             Past Medical History:  Diagnosis Date  . ADD (attention deficit disorder)   . Anemia 2011   2o to GASTRIC BYPASS  . Anginal pain ([place])   . Anxiety   . Blood transfusion without reported diagnosis   . Chronic daily headache   . Depression   . Depression   . Dysmenorrhea 09/18/2013  . Dysrhythmia   . Elevated liver enzymes JUL 2011ALK PHOS 111-127 AST  143-267 ALT  213-321T BILI 0.6  ALB  3.7-4.06 Jun 2011 ALK PHOS 118 AST 24 ALT 42 T BILI 0.4 ALB 3.9  . Encounter for drug rehabilitation    at behavioral health for opioid addiction about 4 years ago  . Family history of adverse reaction to anesthesia    'dad had to be kept on pump for breathing for morphine'  . Fatty liver   . Fibroid 01/18/2017  . Fibromyalgia   . Gastric bypass status for obesity   . Gastritis JULY 2011  . Heart murmur   . 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 History of Holter monitoring   . Hx of opioid abuse     for about 4 years, about 4 years ago  . Interstitial cystitis   . Iron deficiency anemia 07/23/2010  . Irritable bowel syndrome 2012 DIARRHEA   JUN 2012 TTG IgA 14.9  . IUD FEB 2010  . Lupus   . Menorrhagia 07/18/2013  . Migraines   . Obesity (BMI 30-39.9) 2011 228 LBS BMI 36.8  . Ovarian cyst   . Patient desires pregnancy 09/18/2013  . Polyneuropathy ([place])   . PONV (postoperative nausea and vomiting) seizure post-operatively  . Potassium (K) deficiency   . Pregnant 12/25/2013  . Psychiatric pseudoseizure   . RLQ abdominal pain 07/18/2013  . Sciatica of left side 11/14/2014  . 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 Stress 09/18/2013                              Surgical Hx:         Past Surgical History:  Procedure Laterality Date  . BIOPSY  04/10/2015   Procedure: BIOPSY;  Surgeon: Daneil Dolin, MD;  Location: [facility];  Service: Endoscopy;;  . cath self every nite     for sodium bicarb injection (discontinued 2013)  . CHOLECYSTECTOMY  2005   biliary dyskinesia  . COLONOSCOPY  JUN 2012 ABD PN/DIARRHEA WITH PROPOFOL   NL COLON  .  DILATION AND CURETTAGE OF UTERUS    . ESOPHAGEAL DILATION N/A 04/10/2015   Procedure: ESOPHAGEAL DILATION WITH 54FR MALONEY DILATOR;  Surgeon: Daneil Dolin, MD;  Location: [facility];  Service: Endoscopy;  Laterality: N/A;  . ESOPHAGOGASTRODUODENOSCOPY    . ESOPHAGOGASTRODUODENOSCOPY (EGD) WITH PROPOFOL N/A 04/10/2015   Procedure: ESOPHAGOGASTRODUODENOSCOPY (EGD) WITH PROPOFOL;  Surgeon: Daneil Dolin, MD;  Location: [facility];  Service: Endoscopy;  Laterality: N/A;  . ESOPHAGOGASTRODUODENOSCOPY (EGD) WITH PROPOFOL N/A 12/06/2016   Procedure: ESOPHAGOGASTRODUODENOSCOPY (EGD) WITH PROPOFOL;  Surgeon: Danie Binder, MD;  Location: [facility];  Service: Endoscopy;  Laterality: N/A;  . GAB  2007   in [place]-POUCH 5 CM  . GASTRIC BYPASS  06/2006  . HYSTEROSCOPY W/D&C N/A 09/12/2014   Procedure: DILATATION AND CURETTAGE  /HYSTEROSCOPY;  Surgeon: Jonnie Kind, MD;  Location: [facility];  Service: Gynecology;  Laterality: N/A;  . REPAIR VAGINAL CUFF N/A 07/30/2014   Procedure: REPAIR VAGINAL CUFF;  Surgeon: Mora Bellman, MD;  Location: [facility];  Service: Gynecology;  Laterality: N/A;  . SAVORY DILATION  06/20/2012   Dr. Barnie Alderman gastritis/Ulcer in the mid jejunum. Empiric dilation.   . SURAL NERVE BX Left 02/25/2016   Procedure: LEFT SURAL NERVE BIOPSY;  Surgeon: Jovita Gamma, MD;  Location: [facility];  Service: Neurosurgery;  Laterality: Left;  Left sural nerve biopsy  . TONSILLECTOMY    . TONSILLECTOMY AND ADENOIDECTOMY    . UPPER GASTROINTESTINAL ENDOSCOPY  JULY 2011 NAUSEA-D125,V6, PH 25   Bx; GASTRITIS, POUCH-5 CM LONG  . WISDOM TOOTH EXTRACTION     Medications: Reviewed & Updated - see associated section                       Current Outpatient Prescriptions:  .  albuterol (PROVENTIL HFA;VENTOLIN HFA) 108 (90 Base) MCG/ACT inhaler, Inhale 2 puffs into the lungs every 6 (six) hours as needed for wheezing., Disp: 1 Inhaler, Rfl: 2 .  amphetamine-dextroamphetamine (ADDERALL XR) 30 MG 24 hr capsule, Take 1 capsule (30 mg total) by mouth daily. Fill after 03/12/17, Disp: 30 capsule, Rfl: 0 .  Butalbital-APAP-Caffeine 50-300-40 MG CAPS, Take 1 every 6 hours prn migraine, Disp: 60 capsule, Rfl: 2 .  carbamazepine (TEGRETOL) 200 MG tablet, Take two tablets at bedtime, Disp: 60 tablet, Rfl: 2 .  clonazePAM (KLONOPIN) 0.5 MG tablet, Take 1 tablet (0.5 mg total) by mouth 3 (three) times daily., Disp: 90 tablet, Rfl: 2 .  cyclobenzaprine (FLEXERIL) 10 MG tablet, Take 1 tablet (10 mg total) by mouth 3 (three) times daily., Disp: 30 tablet, Rfl: 0 .  DULoxetine (CYMBALTA) 60 MG capsule, Take 1 capsule (60 mg total) by mouth 2 (two) times daily., Disp: 60 capsule, Rfl: 2 .  HYDROcodone-acetaminophen (NORCO) 10-325 MG tablet, Take 1 tablet by mouth every 6 (six) hours as needed., Disp: 30 tablet, Rfl:  0 .  Pantoprazole Sodium (PROTONIX PO), Take 40 mg by mouth daily., Disp: , Rfl:  .  Pediatric Multivitamins-Iron (FLINTSTONES PLUS IRON PO), Take by mouth daily., Disp: , Rfl:  .  promethazine (PHENERGAN) 25 MG tablet, Take 1 tablet (25 mg total) by mouth every 6 (six) hours as needed for nausea or vomiting., Disp: 30 tablet, Rfl: 1 .  topiramate (TOPAMAX) 50 MG tablet, Taking 1 in QAm and 2 QHS, Disp: 90 tablet, Rfl: 2 .  vitamin B-12 (CYANOCOBALAMIN) 100 MCG tablet, Take 100 mcg by mouth daily., Disp: , Rfl:  .  ferumoxytol (FERAHEME) 510  MG/17ML SOLN injection, Inject 17 mLs (510 mg total) into the vein once., Disp: 17 mL, Rfl: 0   Social History: Reviewed -  reports that she has quit smoking. Her smoking use included Cigarettes. She has a 1.50 pack-year smoking history. She has never used smokeless tobacco.  Objective Findings:  Vitals: Blood pressure 116/74, pulse 91, weight 211 lb (95.7 kg), last menstrual period 02/21/2017.  Physical Examination: General appearance - alert, well appearing, and in no distress Mental status - alert, oriented to person, place, and time Pelvic -dGeneral appearance - alert, well appearing, and in no distress Chest - clear to auscultation, no wheezes, rales or rhonchi, symmetric air entry Heart - normal rate and regular rhythm Abdomen - soft, nontender, nondistended, no masses or organomegaly                     Pelvic - EFG normal, vagina normal secretions.             Cervix: multipara              Uterus: anterior, mobile, nontender             Adnexa Nontender Extremities - peripheral pulses normal, no pedal edema, no clubbing or cyanosis    Assessment & Plan:   A:  1. Menorrhagia  2. Desire for sterilization  P:  1. BTL (fallope ring placement) and endometrial ablation to schedule for 03/24/17 2.  The provider spent over 25 minutes with the visit , including previsit review, and documentation,with >than 50% spent in counseling and  coordination of care.    By signing my name below, I, Sonum Patel, attest that this documentation has been prepared under the direction and in the presence of Jonnie Kind, MD. Electronically Signed: Sonum Patel, Education administrator. 03/09/17. 4:06 PM.  I personally performed the services described in this documentation, which was SCRIBED in my presence. The recorded information has been reviewed and considered accurate. It has been edited as necessary during review. Jonnie Kind, MD

## 2017-03-24 NOTE — Brief Op Note (Signed)
03/24/2017  12:33 PM  PATIENT:  Penny [name]  31 y.o. female  PRE-OPERATIVE DIAGNOSIS:  desire for permanent sterilization,menorrhagia,anemia  POST-OPERATIVE DIAGNOSIS:  desire for permanent sterilization,menorrhagia,anemia  PROCEDURE:  Procedure(s): LAPAROSCOPIC TUBAL LIGATION (Falope Rings) (Bilateral) DILATATION & CURETTAGE/HYSTEROSCOPY WITH NOVASURE ENDOMETRIAL ABLATION (N/A)  SURGEON:  Surgeon(s) and Role:    * Jonnie Kind, MD - Primary  PHYSICIAN ASSISTANT:   ASSISTANTS: none   ANESTHESIA:   local, general and paracervical block  EBL:  Total I/O In: 1200 [I.V.:1200] Out: 10 [Blood:10]  BLOOD ADMINISTERED:none  DRAINS: none   LOCAL MEDICATIONS USED:  MARCAINE     SPECIMEN:  Source of Specimen:  Endometrial curettings  DISPOSITION OF SPECIMEN:  PATHOLOGY  COUNTS:  YES  TOURNIQUET:  * No tourniquets in log *  DICTATION: .Dragon Dictation  PLAN OF CARE: Discharge to home after PACU  PATIENT DISPOSITION:  PACU - hemodynamically stable.   Delay start of Pharmacological VTE agent (>24hrs) due to surgical blood loss or risk of bleeding: not applicable

## 2017-03-24 NOTE — Anesthesia Preprocedure Evaluation (Signed)
Anesthesia Evaluation  Patient identified by MRN, date of birth, ID band Patient awake    Reviewed: Allergy & Precautions, H&P , NPO status , Patient's Chart, lab work & pertinent test results  History of Anesthesia Complications (+) PONV and history of anesthetic complications  Airway Mallampati: II  TM Distance: >3 FB Neck ROM: full    Dental  (+) Teeth Intact   Pulmonary Current Smoker, former smoker,    breath sounds clear to auscultation       Cardiovascular hypertension, On Medications  Rhythm:regular Rate:Normal     Neuro/Psych  Headaches, Seizures -,  PSYCHIATRIC DISORDERS (ADD) Anxiety Depression  Neuromuscular disease    GI/Hepatic neg GERD  ,S/p gastric bypass    Endo/Other  Morbid obesity  Renal/GU      Musculoskeletal  (+) Fibromyalgia -  Abdominal   Peds  Hematology  (+) anemia ,   Anesthesia Other Findings       Reproductive/Obstetrics                             Anesthesia Physical Anesthesia Plan  ASA: III  Anesthesia Plan: General   Post-op Pain Management:    Induction: Intravenous, Rapid sequence and Cricoid pressure planned  Airway Management Planned: Oral ETT  Additional Equipment:   Intra-op Plan:   Post-operative Plan: Extubation in OR  Informed Consent: I have reviewed the patients History and Physical, chart, labs and discussed the procedure including the risks, benefits and alternatives for the proposed anesthesia with the patient or authorized representative who has indicated his/her understanding and acceptance.     Plan Discussed with:   Anesthesia Plan Comments:         Anesthesia Quick Evaluation

## 2017-03-24 NOTE — Op Note (Signed)
03/24/2017  12:33 PM  PATIENT:  Penny [name]  31 y.o. female  PRE-OPERATIVE DIAGNOSIS:  desire for permanent sterilization,menorrhagia,anemia  POST-OPERATIVE DIAGNOSIS:  desire for permanent sterilization,menorrhagia,anemia  PROCEDURE:  Procedure(s): LAPAROSCOPIC TUBAL LIGATION (Falope Rings) (Bilateral) DILATATION & CURETTAGE/HYSTEROSCOPY WITH NOVASURE ENDOMETRIAL ABLATION (N/A)  SURGEON:  Surgeon(s) and Role:    * Jonnie Kind, MD - Primary  PHYSICIAN ASSISTANT:   ASSISTANTS: none   ANESTHESIA:   local, general and paracervical block  EBL:  Total I/O In: 1200 [I.V.:1200] Out: 10 [Blood:10]  BLOOD ADMINISTERED:none  DRAINS: none   LOCAL MEDICATIONS USED:  MARCAINE     SPECIMEN:  Source of Specimen:  Endometrial curettings  DISPOSITION OF SPECIMEN:  PATHOLOGY  COUNTS:  YES  TOURNIQUET:  * No tourniquets in log *  DICTATION: .Dragon Dictation  PLAN OF CARE: Discharge to home after PACU  PATIENT DISPOSITION:  PACU - hemodynamically stable.   Delay start of Pharmacological VTE agent (>24hrs) due to surgical blood loss or risk of bleeding: not applicable Details of procedure. Patient was taken operating room prepped and draped for abdominal and vaginal combined procedure. Timeout was conducted and surgical procedure confirmed by operative team. Ancef was administered 2 g. Local 2 cm incision was made through the skin and subcutaneous fatty tissue. As the abdominal wall laxity and a open technique was necessary. Allis clamps were used to grasp the fascia which can be elevated a small incision made approximately 1 cm in length through the fashion of vertical fashion, then the with the fascia elevated the pneumoperitoneum could be achieved under 8 mmHg pressure and then the mental manner laparoscopic camera was used to insert the camera through the umbilical site and the pelvis and abdomen visualized. There was no suspicion of any trauma associated with insufflation or  peritoneal entry. There were no adhesions in the lower half of the abdomen. Attention was directed suprapubic area and the 8 mm trocar was inserted under direct visualization. Attention was then directed to the fallopian tubes which could be visualized in their entirety, grasped with the Falope ring applicator and a Falope ring placed at the midportion the tube bilaterally. Photos were taken to document procedure. Anus Marcaine was used to infiltrate the incarcerated knuckle of tube and the mesosalpinx beneath it in order to improve postoperative pain. The procedure was then completed by removal of laparoscopic equipment, placement 120 cc of saline in the abdomen, deflation of the carbon dioxide, and then closure of the 2 sites. This suprapubic site only requires subcuticular 4-0 Vicryl skin closure and the umbilical site was closed at the fascial level with interrupted 0 Vicryl followed by interrupted suture of 4-0 Vicryl in the subcutaneous fatty space and then subcuticular interrupted 0 Vicryl beneath the skin at the umbilicus 3 stitches sponge and needle counts were correct patient to recovery room in good condition. Marcaine was injected beneath the skin around the umbilicus because around the fascial approximation in order to reduce her pain. Patient is anticipated to have low pain threshold. Endometrial ablation: The surgeon repositioned to the vaginal area with the inserted the cervix could be grasped with single-tooth tenaculum and sounded to 7.5 cm. It was in the anteflexed position and could be dilated to 23 [place] allowing introduction of the 30 operative hysteroscope which revealed a smooth endometrial cavity with no evidence of trauma associated with dilation or inspection curettage with a #1 in Demetra curet was then was performed and small amount of tissue sample obtained and sent  for pathology the NovaSure endometrial ablation device was then prepared inserted 4.5 cm endometrial cavity length,  3.9 cm width and activated and the activation sequence completed successfully. Paracervical block had been applied prior to the ablation process. As 20 cc. Sponge and needle counts were correct. Steri-Strips were applied to the skin incision and patient allowed to go recovery room in stable condition

## 2017-03-24 NOTE — OR Nursing (Signed)
Patient has questions regarding her procedure. States she did not know what she was having done although verbally confirmed consent for laparoscopic sterilization and ablation. Dr. Glo Herring aware and discussed with patient and mother. Patient verbalizes understanding of procedure and has no questions.

## 2017-03-25 ENCOUNTER — Telehealth: Payer: Self-pay | Admitting: *Deleted

## 2017-03-25 ENCOUNTER — Encounter (HOSPITAL_COMMUNITY): Admission: RE | Admit: 2017-03-25 | Payer: Medicaid Other | Source: Ambulatory Visit

## 2017-03-25 ENCOUNTER — Encounter (HOSPITAL_COMMUNITY): Payer: Self-pay | Admitting: Obstetrics and Gynecology

## 2017-03-25 ENCOUNTER — Other Ambulatory Visit: Payer: Self-pay | Admitting: Obstetrics & Gynecology

## 2017-03-25 MED ORDER — KETOROLAC TROMETHAMINE 10 MG PO TABS: 10.0000 mg | ORAL_TABLET | Freq: Three times a day (TID) | ORAL | 0 refills | Status: DC | PRN

## 2017-03-25 NOTE — Telephone Encounter (Signed)
Informed that prescription was sent for Toradol.

## 2017-03-25 NOTE — Telephone Encounter (Signed)
noted 

## 2017-03-25 NOTE — Telephone Encounter (Signed)
Patient called requesting a "non-narcotic" for her pain post surgery. She has oxycodone prescribed by her pain management dr but states she doesn't take because it makes her sick. I called [facility] since order was put in for Norco, but they state it has not been filled and also states not to fill since she has oxycodone. She states she is taking Ibuprofen every 4-6 hours. Please advise.

## 2017-03-27 IMAGING — RF DG ESOPHAGUS
19 of 24 series · 19 of 24 positions shown · non-contrast
Comparison: CT 04/07/2015

CLINICAL DATA: Dysphagia.  Epigastric pain.

EXAM:
ESOPHOGRAM / BARIUM SWALLOW / BARIUM TABLET STUDY
TECHNIQUE: Combined double contrast and single contrast examination performed
using effervescent crystals, thick barium liquid, and thin barium
liquid. The patient was observed with fluoroscopy swallowing a 13 mm
barium sulphate tablet.
FLUOROSCOPY TIME:  Radiation Exposure Index (as provided by the
fluoroscopic device):
If the device does not provide the exposure index:
Fluoroscopy Time:  2 minutes 48 seconds
Number of Acquired Images:  0

[Series 1: run · 1 of 1 slices shown (1 of 19)]
[im 1/1]
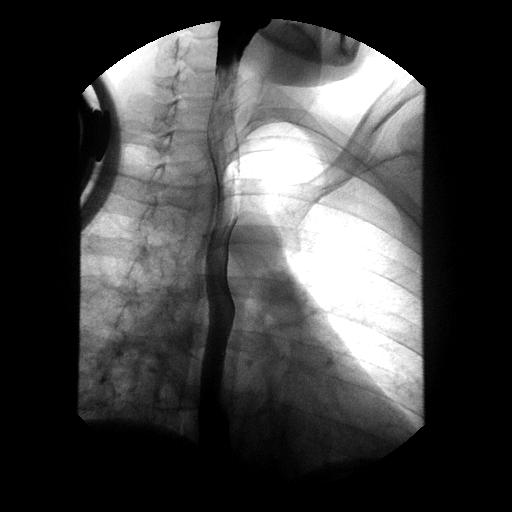

[Series 2: run · 1 of 1 slices shown (2 of 19)]
[im 1/1]
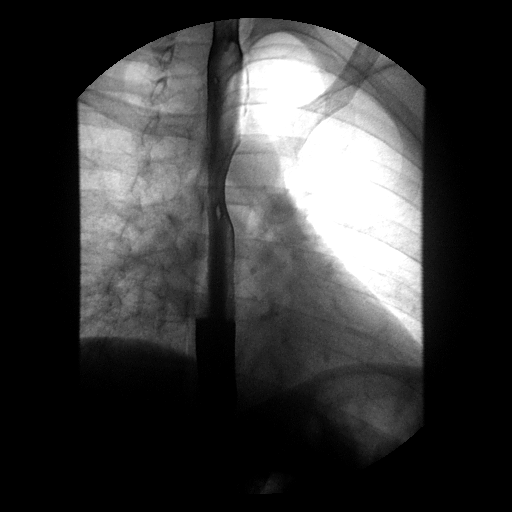

[Series 4: run · 1 of 1 slices shown (3 of 19)]
[im 1/1]
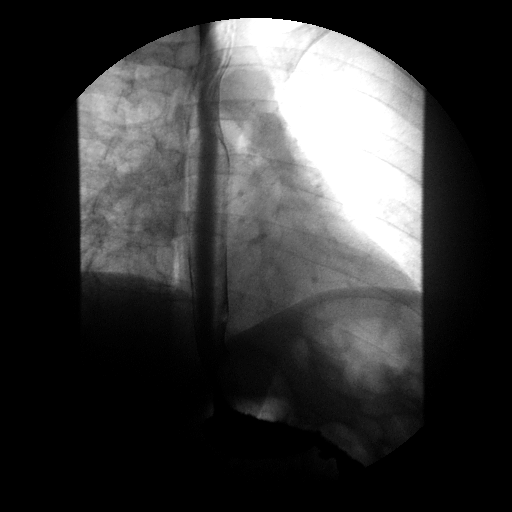

[Series 5: run · 1 of 1 slices shown (4 of 19)]
[im 1/1]
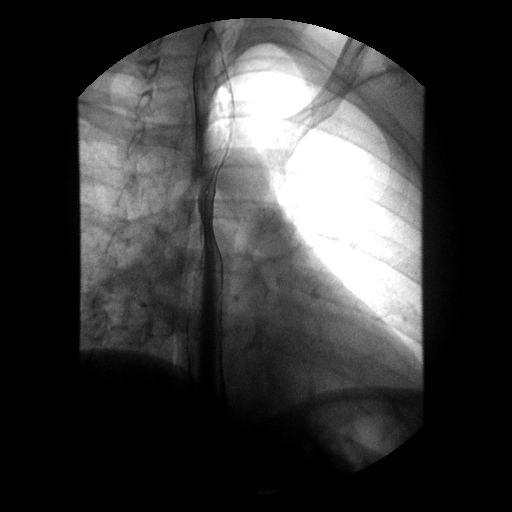

[Series 6: run · 1 of 1 slices shown (5 of 19)]
[im 1/1]
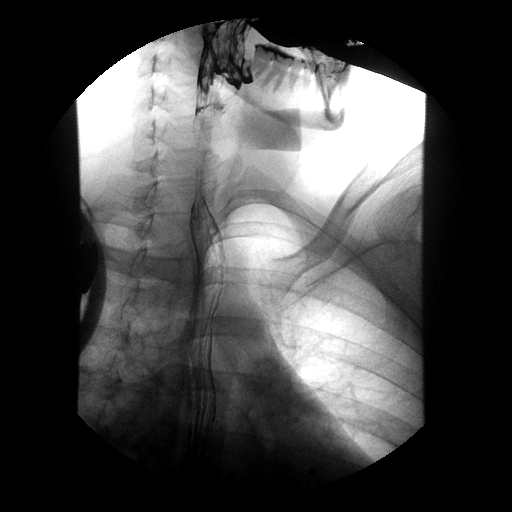

[Series 7: run · 1 of 1 slices shown (6 of 19)]
[im 1/1]
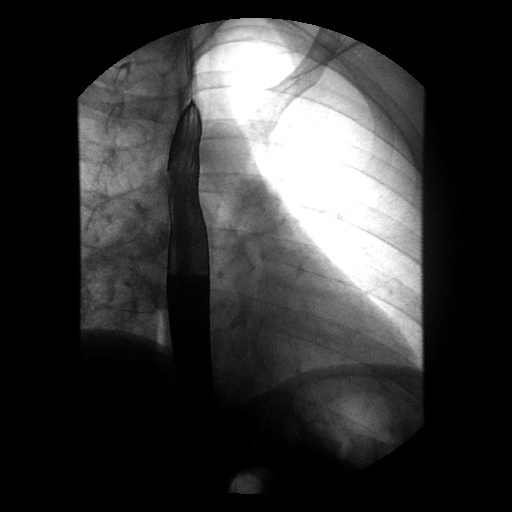

[Series 9: run · 1 of 1 slices shown (7 of 19)]
[im 1/1]
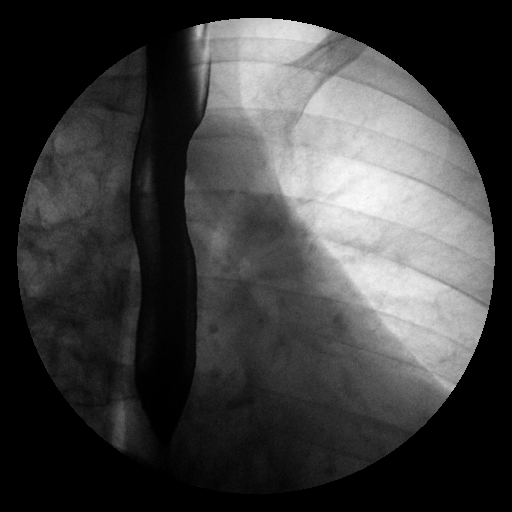

[Series 10: run · 1 of 1 slices shown (8 of 19)]
[im 1/1]
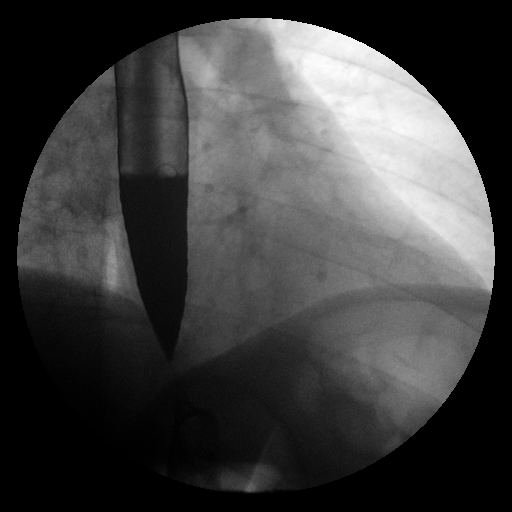

[Series 11: run · 1 of 1 slices shown (9 of 19)]
[im 1/1]
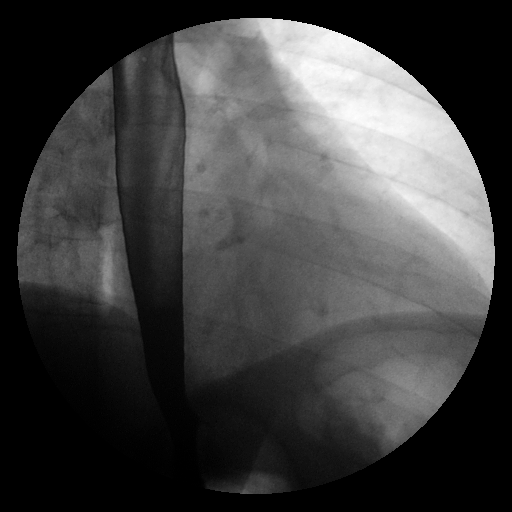

[Series 13: run · 1 of 1 slices shown (10 of 19)]
[im 1/1]
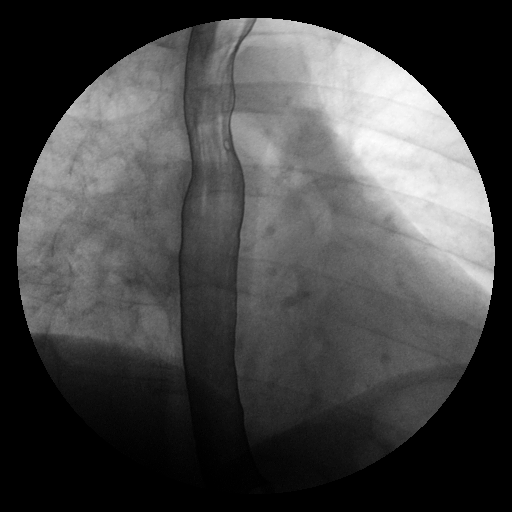

[Series 14: run · 1 of 1 slices shown (11 of 19)]
[im 1/1]
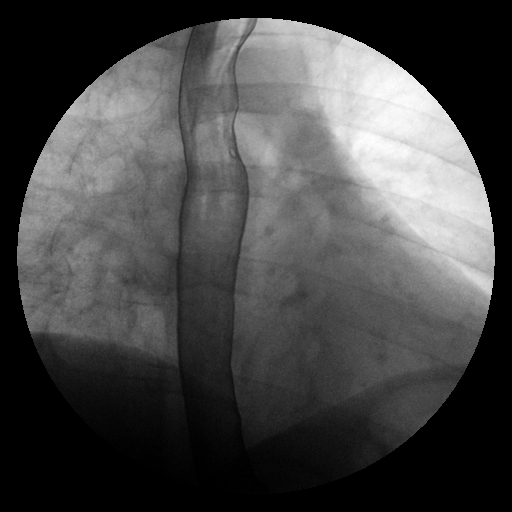

[Series 15: run · 1 of 1 slices shown (12 of 19)]
[im 1/1]
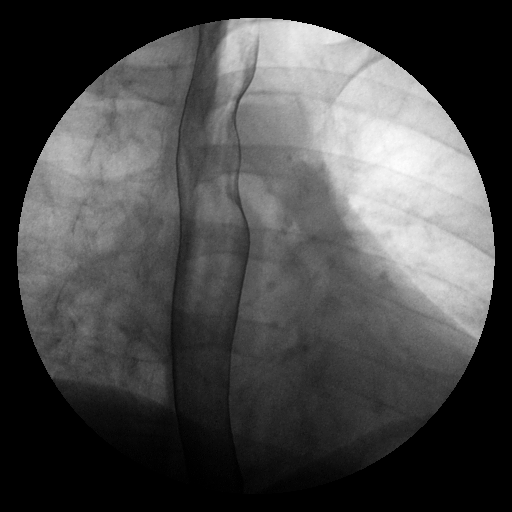

[Series 16: run · 1 of 1 slices shown (13 of 19)]
[im 1/1]
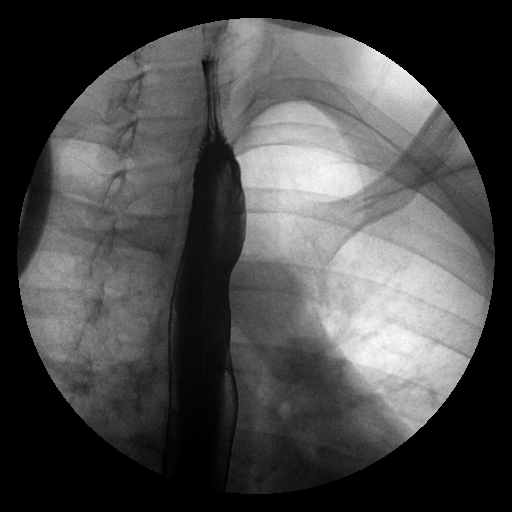

[Series 18: run · 1 of 1 slices shown (14 of 19)]
[im 1/1]
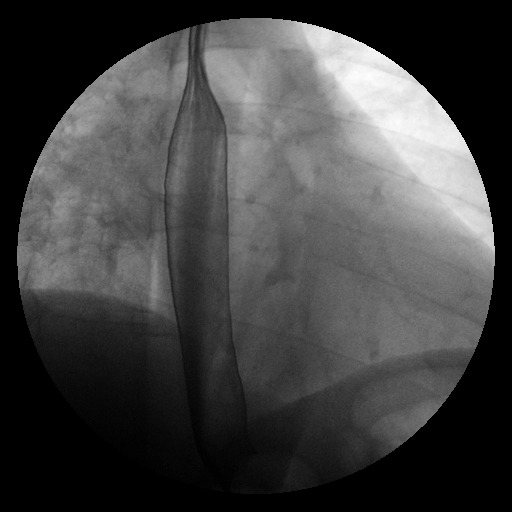

[Series 19: run · 1 of 1 slices shown (15 of 19)]
[im 1/1]
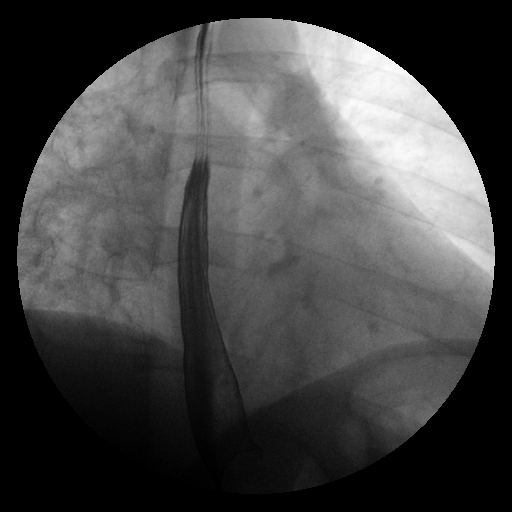

[Series 20: run · 1 of 1 slices shown (16 of 19)]
[im 1/1]
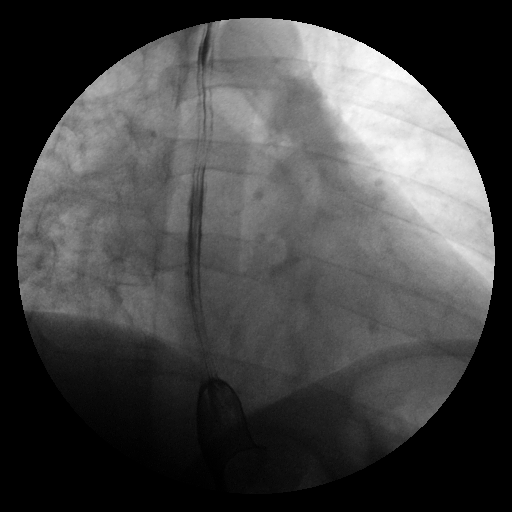

[Series 21: run · 1 of 1 slices shown (17 of 19)]
[im 1/1]
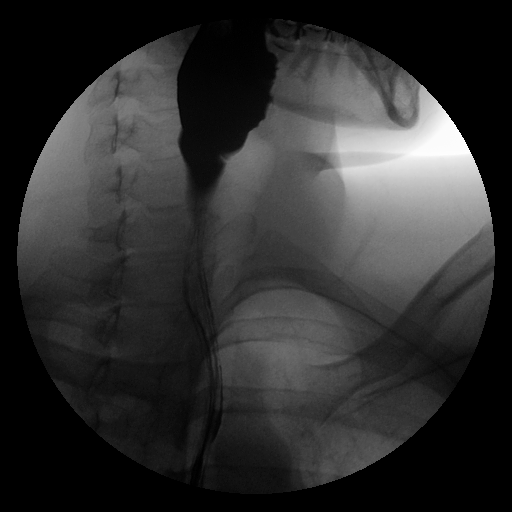

[Series 23: run · 1 of 1 slices shown (18 of 19)]
[im 1/1]
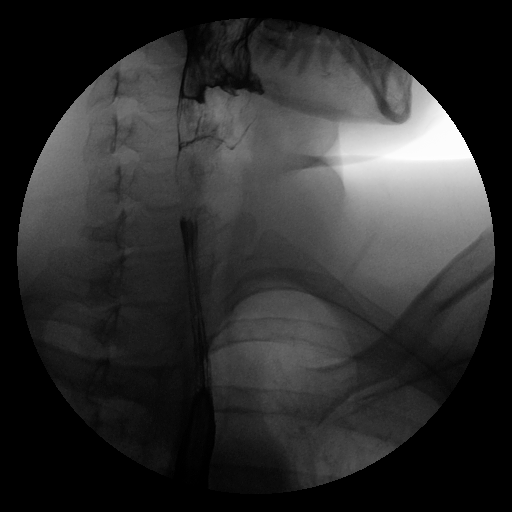

[Series 24: run · 1 of 1 slices shown (19 of 19)]
[im 1/1]
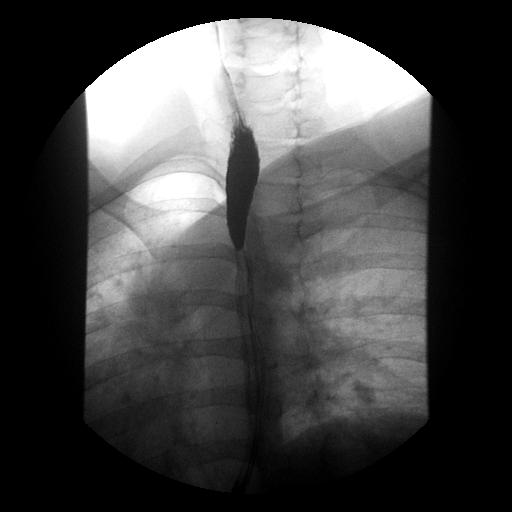

[19 of 24 positions shown; findings below may reference images not displayed]

FINDINGS: Fluoroscopic evaluation of swallowing demonstrates normal esophageal
peristalsis. No stricture, fold thickening or mass. No reflux with
the water siphon maneuver. The patient swallowed a 13 mm barium
tablet which briefly sticks in the distal esophagus despite no
visible stricture. This reproduces the patient's symptoms.
IMPRESSION: Brief sticking of the 13 mm barium tablet in the distal esophagus,
reproducing the patient's symptoms. No visible abnormality in this
area.

## 2017-03-28 ENCOUNTER — Encounter (HOSPITAL_COMMUNITY): Payer: Medicaid Other

## 2017-03-28 NOTE — Progress Notes (Signed)
Dr Laural Golden notified of appt for 4/20 and today,4/23, canceled per pt. Rescheduled for 4/25. No new orders given.

## 2017-03-29 ENCOUNTER — Telehealth: Payer: Self-pay | Admitting: *Deleted

## 2017-03-29 ENCOUNTER — Other Ambulatory Visit: Payer: Self-pay | Admitting: *Deleted

## 2017-03-29 MED ORDER — KETOROLAC TROMETHAMINE 10 MG PO TABS: 10.0000 mg | ORAL_TABLET | Freq: Three times a day (TID) | ORAL | 0 refills | Status: DC | PRN

## 2017-03-29 NOTE — Telephone Encounter (Signed)
Patient called requesting a refill on ketorolac. She states she is still having pain around her belly button incision.

## 2017-03-29 NOTE — Telephone Encounter (Signed)
Informed patient prescription was sent

## 2017-03-29 NOTE — Telephone Encounter (Signed)
Called Penny [name] and reminded her of no show policy per Maurice Small and Penny [name] verbalized understanding. Appt was resch for Penny [name] and she will be returning to see Maurice Small on 04-13-2017. Penny [name] did confirm aoot for both Dr. Harrington Challenger and Maurice Small.

## 2017-03-29 NOTE — Telephone Encounter (Signed)
Per verbal from [place] to sch one more appt for pt if Dr. Harrington Challenger is willing to see pt again. Per Verbal from [place] to remind pt of the no show policy due to how many times pt has missed with her. Staff verbalized understanding.

## 2017-03-30 ENCOUNTER — Encounter (HOSPITAL_COMMUNITY)
Admission: RE | Admit: 2017-03-30 | Discharge: 2017-03-30 | Disposition: A | Discharge status: Home / Self Care | Payer: Medicaid Other | Source: Ambulatory Visit | Attending: Internal Medicine | Admitting: Internal Medicine

## 2017-03-30 ENCOUNTER — Ambulatory Visit: Payer: Medicaid Other | Admitting: Adult Health

## 2017-03-30 ENCOUNTER — Encounter: Payer: Self-pay | Admitting: *Deleted

## 2017-03-30 ENCOUNTER — Encounter (HOSPITAL_COMMUNITY): Payer: Self-pay

## 2017-03-30 ENCOUNTER — Other Ambulatory Visit: Payer: Self-pay | Admitting: Family Medicine

## 2017-03-30 VITALS — BP 118/76 | HR 76 | Ht 66.0 in | Wt 217.0 lb

## 2017-03-30 DIAGNOSIS — R300 Dysuria: Secondary | ICD-10-CM | POA: Insufficient documentation

## 2017-03-30 DIAGNOSIS — R52 Pain, unspecified: Secondary | ICD-10-CM | POA: Insufficient documentation

## 2017-03-30 DIAGNOSIS — R8290 Unspecified abnormal findings in urine: Secondary | ICD-10-CM | POA: Insufficient documentation

## 2017-03-30 DIAGNOSIS — Z1389 Encounter for screening for other disorder: Secondary | ICD-10-CM | POA: Insufficient documentation

## 2017-03-30 DIAGNOSIS — N390 Urinary tract infection, site not specified: Secondary | ICD-10-CM

## 2017-03-30 DIAGNOSIS — R319 Hematuria, unspecified: Secondary | ICD-10-CM

## 2017-03-30 DIAGNOSIS — D508 Other iron deficiency anemias: Secondary | ICD-10-CM | POA: Diagnosis not present

## 2017-03-30 LAB — POCT URINALYSIS DIPSTICK
Glucose, UA: NEGATIVE
KETONES UA: NEGATIVE
Nitrite, UA: POSITIVE
Protein, UA: NEGATIVE

## 2017-03-30 MED ORDER — SODIUM CHLORIDE 0.9 % IV SOLN
750.0000 mg | Freq: Once | INTRAVENOUS | Status: AC
  Administered 2017-03-30: 750 mg via INTRAVENOUS
  Filled 2017-03-30: qty 15

## 2017-03-30 MED ORDER — FLUCONAZOLE 150 MG PO TABS: ORAL_TABLET | ORAL | 1 refills | Status: DC

## 2017-03-30 MED ORDER — CIPROFLOXACIN HCL 500 MG PO TABS: 500.0000 mg | ORAL_TABLET | Freq: Two times a day (BID) | ORAL | 0 refills | Status: DC

## 2017-03-30 MED ORDER — SODIUM CHLORIDE 0.9 % IV SOLN
Freq: Once | INTRAVENOUS | Status: AC
  Administered 2017-03-30: 250 mL via INTRAVENOUS

## 2017-03-30 MED ORDER — KETOROLAC TROMETHAMINE 10 MG PO TABS: 10.0000 mg | ORAL_TABLET | Freq: Four times a day (QID) | ORAL | 0 refills | Status: DC | PRN

## 2017-03-30 MED ORDER — URIBEL 118 MG PO CAPS: ORAL_CAPSULE | ORAL | 1 refills | Status: DC

## 2017-03-30 NOTE — Progress Notes (Signed)
Subjective:     Patient ID: Penny [name], female   DOB: [birth year], 31 y.o.   MRN: [id number]  HPI [name] is a 31 year old white female, worked in for odor in urine and itching and burning when she pees and back pain and low abdomen pain.She had tubal and ablation about 6 days ago.She request pain meds.   Review of Systems Odor in urine Burning and itching with urination Pain in back and low abdomen near incisions  Reviewed past medical,surgical, social and family history. Reviewed medications and allergies.     Objective:   Physical Exam BP 118/76 (BP Location: Right Arm, Patient Position: Sitting, Cuff Size: Normal)   Pulse 76   Ht 5\' 6"  (1.676 m)   Wt 217 lb (98.4 kg)   BMI 35.02 kg/m Urine dipstick +nitrates, mod leuks,trace blood. Skin warm and dry.No CVAT.    Assessment:        1. Abnormal urine odor   2. Urinary tract infection with hematuria, site unspecified   3. Screening for genitourinary condition   4. Pain   5. Burning with urination       Plan:     UA C&S sent  Rx cipro 500 mg #14 take 1 bid,no refills  Rx diflucan 150 mg #2 take 1 now and 1 in 3 days if needed with 1 refill Rx uribel #20 take 1 tid prn with 1 refill Rx toradol 10 mg #15 take 1 every 6 hours prn pain no refills  Push fluids Return as scheduled

## 2017-03-31 LAB — MICROSCOPIC EXAMINATION
CASTS: NONE SEEN /LPF
WBC, UA: >30 /hpf — AB (ref 0–?)

## 2017-03-31 LAB — URINALYSIS, ROUTINE W REFLEX MICROSCOPIC
BILIRUBIN UA: NEGATIVE
Glucose, UA: NEGATIVE
KETONES UA: NEGATIVE
Nitrite, UA: POSITIVE — AB
PROTEIN UA: NEGATIVE
SPEC GRAV UA: 1.018 (ref 1.005–1.030)
Urobilinogen, Ur: 1 mg/dL (ref 0.2–1.0)
pH, UA: 6.5 (ref 5.0–7.5)

## 2017-03-31 NOTE — Telephone Encounter (Signed)
noted 

## 2017-04-01 LAB — URINE CULTURE

## 2017-04-04 ENCOUNTER — Ambulatory Visit (INDEPENDENT_AMBULATORY_CARE_PROVIDER_SITE_OTHER): Payer: Medicaid Other | Admitting: Family Medicine

## 2017-04-04 VITALS — BP 118/74 | Temp 98.7°F | Ht 66.0 in | Wt 214.0 lb

## 2017-04-04 DIAGNOSIS — M7918 Myalgia, other site: Secondary | ICD-10-CM

## 2017-04-04 DIAGNOSIS — M791 Myalgia: Secondary | ICD-10-CM

## 2017-04-04 MED ORDER — CHLORZOXAZONE 500 MG PO TABS: 500.0000 mg | ORAL_TABLET | Freq: Three times a day (TID) | ORAL | 0 refills | Status: DC | PRN

## 2017-04-04 MED ORDER — PREDNISONE 20 MG PO TABS: ORAL_TABLET | ORAL | 0 refills | Status: DC

## 2017-04-04 NOTE — Progress Notes (Signed)
   Subjective:    Patient ID: Penny [name], female    DOB: 1986/04/14, 31 y.o.   MRN: [id number]  HPIDental pain for 3 days pain going down neck and arm. Sore throat, ear pain, headache.  Went to dentist today.  Patient relates pain on the left side of her face in the sinus region to 3 gin as well as jaw left side of her neck into the top part of her chest denies shortness of breath denies fever chills vomiting denies postnasal drip  She is on chronic pain medicine states she ran out of her plain oxycodone because of recent surgery  Denies high fevers wheezing difficulty breathing or difficulty swallowing   Review of Systems Please see above    Objective:   Physical Exam Eardrums are normal throat appears normal she has a hard time opening her mouth but once we examine the area does seem to open okay I don't see any sign of any tumor or growth or infection her teeth appear normal neck has no masses lungs are clear hearts regular no unilateral weakness noted I don't see anything that would indicate possibility of stroke       Assessment & Plan:  Nonspecific dental pain versus facial pain-patient also has some discomfort radiating into left side of the neck and upper chest-no sign of any dissection or other catastrophic diagnosis-if ongoing troubles she may need to follow-up for x-rays and labs if not better within 1 week follow-up sooner problems  Prescription for muscle relaxer to use only at home during the day when necessary, use anti-inflammatories wants prednisone runs out, short course of prednisone, if ongoing trouble follow-up caution drowsiness  Patient states she is out of her oxycodone she needs to call her pain management doctor for this

## 2017-04-05 ENCOUNTER — Ambulatory Visit (HOSPITAL_COMMUNITY): Payer: Self-pay | Admitting: Psychiatry

## 2017-04-08 ENCOUNTER — Telehealth: Payer: Self-pay | Admitting: Obstetrics and Gynecology

## 2017-04-08 ENCOUNTER — Encounter: Payer: Medicaid Other | Admitting: Obstetrics and Gynecology

## 2017-04-08 NOTE — Telephone Encounter (Signed)
Questions answered during preop process

## 2017-04-08 NOTE — Telephone Encounter (Signed)
Pt scheduled for postop visit today, but pt has baby with strep throat, and I have surgery of another pt at that time. Pt having no problems. So will cancel appt. Pt aware of outstanding account balance.

## 2017-04-12 ENCOUNTER — Telehealth: Payer: Self-pay | Admitting: *Deleted

## 2017-04-12 ENCOUNTER — Other Ambulatory Visit: Payer: Self-pay | Admitting: Adult Health

## 2017-04-12 NOTE — Telephone Encounter (Signed)
Informed prescription refilled

## 2017-04-13 ENCOUNTER — Ambulatory Visit (HOSPITAL_COMMUNITY): Payer: Self-pay | Admitting: Psychiatry

## 2017-04-14 ENCOUNTER — Other Ambulatory Visit: Payer: Self-pay | Admitting: Family Medicine

## 2017-04-19 ENCOUNTER — Encounter: Payer: Self-pay | Admitting: Family Medicine

## 2017-04-19 ENCOUNTER — Ambulatory Visit (INDEPENDENT_AMBULATORY_CARE_PROVIDER_SITE_OTHER): Payer: Medicaid Other | Admitting: Family Medicine

## 2017-04-19 VITALS — Temp 98.1°F | Ht 66.0 in | Wt 210.4 lb

## 2017-04-19 DIAGNOSIS — J019 Acute sinusitis, unspecified: Secondary | ICD-10-CM | POA: Diagnosis not present

## 2017-04-19 DIAGNOSIS — B9689 Other specified bacterial agents as the cause of diseases classified elsewhere: Secondary | ICD-10-CM

## 2017-04-19 MED ORDER — FLUCONAZOLE 150 MG PO TABS: 150.0000 mg | ORAL_TABLET | Freq: Once | ORAL | 4 refills | Status: AC

## 2017-04-19 MED ORDER — AMOXICILLIN 400 MG/5ML PO SUSR: ORAL | 0 refills | Status: DC

## 2017-04-19 NOTE — Progress Notes (Signed)
   Subjective:    Patient ID: Penny [name], female    DOB: 04-23-1986, 31 y.o.   MRN: [id number]  Cough  This is a new problem. The current episode started in the past 7 days. Associated symptoms include ear pain, a fever, headaches, a sore throat and wheezing. Treatments tried: OTC meds.   Patient with burning throat head congestion drainage coughing sinus pressure pain denies wheezing difficulty breathing at the moment but had wheezing earlier today   Review of Systems  Constitutional: Positive for fever.  HENT: Positive for ear pain and sore throat.   Respiratory: Positive for cough and wheezing.   Neurological: Positive for headaches.       Objective:   Physical Exam Mild tenderness left sinus region eardrums normal throat is normal lungs clear heart regular voice is hoarse       Assessment & Plan:  Viral syndrome Secondary rhinosinusitis Antibiotics describe warning signs discuss

## 2017-04-21 ENCOUNTER — Encounter (HOSPITAL_COMMUNITY): Payer: Self-pay | Admitting: Psychiatry

## 2017-04-21 ENCOUNTER — Ambulatory Visit (INDEPENDENT_AMBULATORY_CARE_PROVIDER_SITE_OTHER): Payer: Medicaid Other | Admitting: Psychiatry

## 2017-04-21 VITALS — BP 105/69 | HR 103 | Ht 66.0 in | Wt 212.0 lb

## 2017-04-21 DIAGNOSIS — F17210 Nicotine dependence, cigarettes, uncomplicated: Secondary | ICD-10-CM | POA: Diagnosis not present

## 2017-04-21 DIAGNOSIS — Z818 Family history of other mental and behavioral disorders: Secondary | ICD-10-CM | POA: Diagnosis not present

## 2017-04-21 DIAGNOSIS — F332 Major depressive disorder, recurrent severe without psychotic features: Secondary | ICD-10-CM | POA: Diagnosis not present

## 2017-04-21 DIAGNOSIS — Z79899 Other long term (current) drug therapy: Secondary | ICD-10-CM | POA: Diagnosis not present

## 2017-04-21 MED ORDER — TOPIRAMATE 50 MG PO TABS: ORAL_TABLET | ORAL | 2 refills | Status: DC

## 2017-04-21 MED ORDER — CARBAMAZEPINE 200 MG PO TABS: ORAL_TABLET | ORAL | 2 refills | Status: DC

## 2017-04-21 MED ORDER — DULOXETINE HCL 60 MG PO CPEP: 120.0000 mg | ORAL_CAPSULE | Freq: Every day | ORAL | 2 refills | Status: DC

## 2017-04-21 MED ORDER — SUVOREXANT 20 MG PO TABS: 20.0000 mg | ORAL_TABLET | Freq: Every day | ORAL | 2 refills | Status: DC

## 2017-04-21 MED ORDER — CLONAZEPAM 0.5 MG PO TABS: 0.5000 mg | ORAL_TABLET | Freq: Three times a day (TID) | ORAL | 2 refills | Status: DC | PRN

## 2017-04-21 MED ORDER — AMPHETAMINE-DEXTROAMPHET ER 30 MG PO CP24: 30.0000 mg | ORAL_CAPSULE | Freq: Every day | ORAL | 0 refills | Status: DC
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
## 2017-04-22 ENCOUNTER — Encounter (HOSPITAL_COMMUNITY): Payer: Self-pay | Admitting: Psychiatry

## 2017-04-22 ENCOUNTER — Ambulatory Visit (INDEPENDENT_AMBULATORY_CARE_PROVIDER_SITE_OTHER): Payer: Medicaid Other | Admitting: Psychiatry

## 2017-04-22 DIAGNOSIS — F332 Major depressive disorder, recurrent severe without psychotic features: Secondary | ICD-10-CM
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
## 2017-04-25 ENCOUNTER — Other Ambulatory Visit (HOSPITAL_COMMUNITY): Payer: Self-pay | Admitting: Psychiatry

## 2017-04-25 ENCOUNTER — Telehealth (HOSPITAL_COMMUNITY): Payer: Self-pay

## 2017-04-25 ENCOUNTER — Telehealth: Payer: Self-pay | Admitting: Adult Health

## 2017-04-25 ENCOUNTER — Telehealth: Payer: Self-pay | Admitting: Obstetrics and Gynecology

## 2017-04-25 ENCOUNTER — Other Ambulatory Visit: Payer: Self-pay | Admitting: Family Medicine

## 2017-04-25 MED ORDER — SUVOREXANT 10 MG PO TABS: 10.0000 mg | ORAL_TABLET | Freq: Every day | ORAL | 0 refills | Status: DC

## 2017-04-25 NOTE — Telephone Encounter (Signed)
Telephone call with patient after speaking with [employer] and then [place], representative with [facility] to attempt to have patient's Belsomra approved.  On-line form has to be signed by Dr. Harrington Challenger and faxed in for the prior authorization to be completed and then a decision would be made.  Informed patient this nurse was sending the form to the [place] office for Dr. Harrington Challenger to review, sign and have faxed back to [place] up on her return to try to get patient's Belsomra approved.  Agreed to also call the [place] office to see if perhaps Dr. Modesta Messing who is there has any samples of Belsomra 20 mg to assist patient with while she is awaiting approval and would have the [place] office call her back today if this is possible.  Informed patient if she does not hear back today to contact their office in the AM to make sure form signed and sent in by Dr. Harrington Challenger and patient agreed with plan. Request sent to Dr. Modesta Messing through staff message and called the [place] office and spoke to Forrest Moron, helping to cover there today to inform would be scanning and emailing form to her to have Emilio Aspen, CMA have Dr. Harrington Challenger sign on 04/26/17 upon her return.  Collateral agreed to have Dr. Modesta Messing check her messages and to inform of request to assist with a few days supply of Belsomra until prior authorization decision made if Dr. Modesta Messing has samples and agrees with plan.  Message sent to Dr. Modesta Messing and Dr. Harrington Challenger for follow up.

## 2017-04-25 NOTE — Telephone Encounter (Signed)
Patient reports malaise, discomforts, and lite spotting. She alleges that she has had fever to 101.2 and 100.7 last week. Pt has appt tomorrow with Willadean Carol at 3 15 pm, and she is advised to keep that appt. She will have cbc, ESR ordered. Then.

## 2017-04-25 NOTE — Telephone Encounter (Signed)
Will provide samples, Belsomra 10 mg for 3 days (given patient has not tried this medication, will start from lower dose.). Further dose adjustment will be made by Dr. Harrington Challenger after she is back.

## 2017-04-25 NOTE — Telephone Encounter (Signed)
Pt called stating that she has had bad cramping and is still having some bleeding and spotting since her ablation and BTL on April 19,2018. I advised pt that she should be checked by a provider and transferred her to scheduling.

## 2017-04-26 ENCOUNTER — Ambulatory Visit (INDEPENDENT_AMBULATORY_CARE_PROVIDER_SITE_OTHER): Payer: Medicaid Other | Admitting: Obstetrics and Gynecology

## 2017-04-26 ENCOUNTER — Encounter: Payer: Self-pay | Admitting: Obstetrics and Gynecology

## 2017-04-26 ENCOUNTER — Encounter (HOSPITAL_COMMUNITY): Payer: Self-pay | Admitting: *Deleted

## 2017-04-26 VITALS — BP 100/70 | HR 76 | Ht 66.0 in | Wt 216.0 lb

## 2017-04-26 DIAGNOSIS — R102 Pelvic and perineal pain: Secondary | ICD-10-CM

## 2017-04-26 NOTE — Telephone Encounter (Signed)
Per verbal from Dr. Harrington Challenger to give pt a 20 mg tab samples. Per Dr. Harrington Challenger to give pt 2 packs of samples while pt PA is being processed. Office received fax for provider to sign and office to fax to [place] for PA for pt Belsomra 20 mg. Provider signed form and RMA faxed from to [facility] for approval. Pt is aware that samples is ready for pick up and PA form was faxed to [place] for approval. Pt verbalized understanding.

## 2017-04-26 NOTE — Progress Notes (Signed)
[facility] Visit  '@DATE'$ @            Patient name: Penny [name] MRN [id number]  Date of birth: 1986-07-20  CC & HPI:  Penny [name] is a 31 y.o. female presenting today for complaints of pain, after endometrial ablation.   ROS:  ROS   Pertinent History Reviewed:   Reviewed: Significant for no fever chills. Medical         Past Medical History:  Diagnosis Date  . ADD (attention deficit disorder)   . Anemia 2011   2o to GASTRIC BYPASS  . Anginal pain ([place])   . Anxiety   . Blood transfusion without reported diagnosis   . Chronic daily headache   . Depression   . Depression   . Dysmenorrhea 09/18/2013  . Dysrhythmia   . Elevated liver enzymes JUL 2011ALK PHOS 111-127 AST  143-267 ALT  213-321T BILI 0.6  ALB  3.7-4.06 Jun 2011 ALK PHOS 118 AST 24 ALT 42 T BILI 0.4 ALB 3.9  . Encounter for drug rehabilitation    at behavioral health for opioid addiction about 4 years ago  . Family history of adverse reaction to anesthesia    'dad had to be kept on pump for breathing for morphine'  . Fatty liver   . Fibroid 01/18/2017  . Fibromyalgia   . Gastric bypass status for obesity   . Gastritis JULY 2011  . Heart murmur   . 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 History of Holter monitoring   . Hx of opioid abuse    for about 4 years, about 4 years ago  . Interstitial cystitis   . Iron deficiency anemia 07/23/2010  . Irritable bowel syndrome 2012 DIARRHEA   JUN 2012 TTG IgA 14.9  . IUD FEB 2010  . Lupus   . Menorrhagia 07/18/2013  . Migraines   . Obesity (BMI 30-39.9) 2011 228 LBS BMI 36.8  . Ovarian cyst   . Patient desires pregnancy 09/18/2013  . Polyneuropathy   . PONV (postoperative nausea and vomiting) seizure post-operatively  . Potassium (K) deficiency   . Pregnant 12/25/2013  . Psychiatric pseudoseizure   . RLQ abdominal pain 07/18/2013  . Sciatica of left side 11/14/2014  . 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 Stress  09/18/2013                              Surgical Hx:    Past Surgical History:  Procedure Laterality Date  . BIOPSY  04/10/2015   Procedure: BIOPSY;  Surgeon: Daneil Dolin, MD;  Location: [facility];  Service: Endoscopy;;  . cath self every nite     for sodium bicarb injection (discontinued 2013)  . CHOLECYSTECTOMY  2005   biliary dyskinesia  . COLONOSCOPY  JUN 2012 ABD PN/DIARRHEA WITH PROPOFOL   NL COLON  . DILATION AND CURETTAGE OF UTERUS    . DILITATION & CURRETTAGE/HYSTROSCOPY WITH NOVASURE ABLATION N/A 03/24/2017   Procedure: DILATATION & CURETTAGE/HYSTEROSCOPY WITH NOVASURE ENDOMETRIAL ABLATION;  Surgeon: Jonnie Kind, MD;  Location: [facility];  Service: Gynecology;  Laterality: N/A;  . ESOPHAGEAL DILATION N/A 04/10/2015   Procedure: ESOPHAGEAL DILATION WITH 54FR MALONEY DILATOR;  Surgeon: Daneil Dolin, MD;  Location: [facility];  Service: Endoscopy;  Laterality: N/A;  . ESOPHAGOGASTRODUODENOSCOPY    . ESOPHAGOGASTRODUODENOSCOPY (EGD) WITH PROPOFOL N/A 04/10/2015   Procedure: ESOPHAGOGASTRODUODENOSCOPY (EGD) WITH  PROPOFOL;  Surgeon: Daneil Dolin, MD;  Location: [facility];  Service: Endoscopy;  Laterality: N/A;  . ESOPHAGOGASTRODUODENOSCOPY (EGD) WITH PROPOFOL N/A 12/06/2016   Procedure: ESOPHAGOGASTRODUODENOSCOPY (EGD) WITH PROPOFOL;  Surgeon: Danie Binder, MD;  Location: [facility];  Service: Endoscopy;  Laterality: N/A;  . GAB  2007   in [place]-POUCH 5 CM  . GASTRIC BYPASS  06/2006  . HYSTEROSCOPY W/D&C N/A 09/12/2014   Procedure: DILATATION AND CURETTAGE /HYSTEROSCOPY;  Surgeon: Jonnie Kind, MD;  Location: [facility];  Service: Gynecology;  Laterality: N/A;  . LAPAROSCOPIC TUBAL LIGATION Bilateral 03/24/2017   Procedure: LAPAROSCOPIC TUBAL LIGATION ([place] Rings);  Surgeon: Jonnie Kind, MD;  Location: [facility];  Service: Gynecology;  Laterality: Bilateral;  . REPAIR VAGINAL CUFF N/A 07/30/2014   Procedure: REPAIR VAGINAL CUFF;  Surgeon: Mora Bellman, MD;  Location: [facility];   Service: Gynecology;  Laterality: N/A;  . SAVORY DILATION  06/20/2012   Dr. Barnie Alderman gastritis/Ulcer in the mid jejunum. Empiric dilation.   . SURAL NERVE BX Left 02/25/2016   Procedure: LEFT SURAL NERVE BIOPSY;  Surgeon: Jovita Gamma, MD;  Location: [facility];  Service: Neurosurgery;  Laterality: Left;  Left sural nerve biopsy  . TONSILLECTOMY    . TONSILLECTOMY AND ADENOIDECTOMY    . UPPER GASTROINTESTINAL ENDOSCOPY  JULY 2011 NAUSEA-D125,V6, PH 25   Bx; GASTRITIS, POUCH-5 CM LONG  . WISDOM TOOTH EXTRACTION     Medications: Reviewed & Updated - see associated section                       Current Outpatient Prescriptions:  .  albuterol (PROVENTIL HFA;VENTOLIN HFA) 108 (90 Base) MCG/ACT inhaler, Inhale 2 puffs into the lungs every 6 (six) hours as needed for wheezing., Disp: 1 Inhaler, Rfl: 2 .  amphetamine-dextroamphetamine (ADDERALL XR) 30 MG 24 hr capsule, Take 1 capsule (30 mg total) by mouth daily. Fill after 03/12/17, Disp: 30 capsule, Rfl: 0 .  carbamazepine (TEGRETOL) 200 MG tablet, Take two tablets at bedtime, Disp: 60 tablet, Rfl: 2 .  clonazePAM (KLONOPIN) 0.5 MG tablet, Take 1 tablet (0.5 mg total) by mouth 3 (three) times daily as needed for anxiety., Disp: 90 tablet, Rfl: 2 .  DULoxetine (CYMBALTA) 60 MG capsule, Take 2 capsules (120 mg total) by mouth at bedtime., Disp: 60 capsule, Rfl: 2 .  HYDROcodone Bitartrate ER (HYSINGLA ER) 20 MG T24A, Take 20 mg by mouth at bedtime. , Disp: , Rfl:  .  ketorolac (TORADOL) 10 MG tablet, TAKE (1) TABLET BY MOUTH EVERY 6 HOURS AS NEEDED., Disp: 15 tablet, Rfl: 0 .  Meth-Hyo-M Bl-Na Phos-Ph Sal (URIBEL) 118 MG CAPS, Take 1 tid, Disp: 20 capsule, Rfl: 1 .  Pediatric Multivitamins-Iron (FLINTSTONES PLUS IRON PO), Take 1 tablet by mouth 2 (two) times daily. , Disp: , Rfl:  .  phenazopyridine (PYRIDIUM) 100 MG tablet, TAKE (1) TABLET BY MOUTH (3) TIMES A DAY AS NEEDED FOR PAIN., Disp: 21 tablet, Rfl: 0 .  promethazine (PHENERGAN) 25 MG  tablet, Take 1 tablet (25 mg total) by mouth every 6 (six) hours as needed for nausea or vomiting., Disp: 30 tablet, Rfl: 1 .  topiramate (TOPAMAX) 50 MG tablet, Taking 1 in QAm and 2 QHS, Disp: 90 tablet, Rfl: 2 .  vitamin B-12 (CYANOCOBALAMIN) 1000 MCG tablet, Take 1,000 mcg by mouth 2 (two) times daily., Disp: , Rfl:    Social History: Reviewed -  reports that she has been smoking Cigarettes.  She has a 1.50 pack-year smoking history. She has never used smokeless tobacco.  Objective Findings:  Vitals: Blood pressure 100/70, pulse 76, height '5\' 6"'$  (1.676 m), weight 216 lb (98 kg).  Physical Examination: General appearance - alert, well appearing, and in no distress, oriented to person, place, and time, normal appearing weight and chronically ill appearing Mental status - alert, oriented to person, place, and time, depressed mood, affect appropriate to mood Abdomen - soft, nontender, nondistended, no masses or organomegaly Pelvic - normal external genitalia, vulva, vagina, cervix, uterus and adnexa   Assessment & Plan:   A:  1. Normal gyn exam 2 fibromyalgia, chronic pain complaints, requesting pain meds. 3 several small areas of minor folliculitis mons, and labia,. superficial  P:  1. 1 CBC, ESR , progesterone level 2. Will not interfere with pain management contract. 3

## 2017-04-26 NOTE — Progress Notes (Signed)
Pt came into office to pick up samples for Belsomra 20 mg per previous phone call. Pt given 2 packs and each packs contains 3 tablets. LOT number for both packs are [id number] and exp 07-2018. Pt D/L number is [id number] and exp date is 10-31-2023.
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
## 2017-04-28 ENCOUNTER — Telehealth: Payer: Self-pay | Admitting: *Deleted

## 2017-04-28 LAB — PROGESTERONE

## 2017-04-28 LAB — CBC WITH DIFFERENTIAL/PLATELET
BASOS ABS: 0 10*3/uL (ref 0.0–0.2)
Basos: 0 %
EOS (ABSOLUTE): 0.2 10*3/uL (ref 0.0–0.4)
Eos: 4 %
Hematocrit: 38.5 % (ref 34.0–46.6)
Hemoglobin: 12.6 g/dL (ref 11.1–15.9)
Immature Grans (Abs): 0 10*3/uL (ref 0.0–0.1)
Immature Granulocytes: 0 %
Lymphocytes Absolute: 2.2 10*3/uL (ref 0.7–3.1)
Lymphs: 36 %
MCH: 32.1 pg (ref 26.6–33.0)
MCHC: 32.7 g/dL (ref 31.5–35.7)
MCV: 98 fL — ABNORMAL HIGH (ref 79–97)
MONOS ABS: 0.5 10*3/uL (ref 0.1–0.9)
Monocytes: 8 %
Neutrophils Absolute: 3.2 10*3/uL (ref 1.4–7.0)
Neutrophils: 52 %
Platelets: 339 10*3/uL (ref 150–379)
RBC: 3.92 x10E6/uL (ref 3.77–5.28)
RDW: 13.5 % (ref 12.3–15.4)
WBC: 6.1 10*3/uL (ref 3.4–10.8)

## 2017-04-28 LAB — SEDIMENTATION RATE: SED RATE: 4 mm/h (ref 0–32)

## 2017-04-28 NOTE — Telephone Encounter (Signed)
Informed patient that labs were normal with no infection. She wants to why she is still hurting in her bladder and kidney, taking "pyridium like its going out of style". Please advise.

## 2017-05-03 ENCOUNTER — Telehealth (HOSPITAL_COMMUNITY): Payer: Self-pay | Admitting: *Deleted

## 2017-05-03 NOTE — Telephone Encounter (Signed)
phone call from patient, she said she needs PA on Belsomra.    She said she is out of samples.

## 2017-05-04 NOTE — Telephone Encounter (Signed)
please let Alli know

## 2017-05-04 NOTE — Telephone Encounter (Signed)
PA was faxed on 04-28-2017 to [facility] with provider's signature.

## 2017-05-04 NOTE — Telephone Encounter (Signed)
Called pt pharmacy and spoke with April and she stated that they already filled pt medication and it is ready for her to pick it up. Called pt and was unable to reach her and lmtcb.

## 2017-05-05 NOTE — Telephone Encounter (Signed)
Seen in office with wnl findings.

## 2017-05-07 ENCOUNTER — Other Ambulatory Visit: Payer: Self-pay | Admitting: Adult Health

## 2017-05-18 ENCOUNTER — Ambulatory Visit (INDEPENDENT_AMBULATORY_CARE_PROVIDER_SITE_OTHER): Payer: Medicaid Other | Admitting: Psychiatry

## 2017-05-18 ENCOUNTER — Encounter (HOSPITAL_COMMUNITY): Payer: Self-pay

## 2017-05-30 IMAGING — CT CT ABD-PELV W/ CM
2 of 4 series · 16 of 46 positions shown, 18 images · IV contrast (Omnipaque 300)
Comparison: CT Abdomen and Pelvis 04/07/2015 and earlier.

CLINICAL DATA: 28-year-old female with right upper quadrant pain
with nausea and vomiting for 2 weeks. Dizziness, abnormal LFTs,
blood in stool. Previous cholecystectomy and gastric bypass. Initial
encounter.

EXAM:
CT ABDOMEN AND PELVIS WITH CONTRAST
TECHNIQUE: Multidetector CT imaging of the abdomen and pelvis was performed
using the standard protocol following bolus administration of
intravenous contrast.
CONTRAST:  100mL OMNIPAQUE IOHEXOL 300 MG/ML  SOLN

[Series 2: abd_pel_with 5.0 b40f · axial · 0.79mm/px · z∈[-466,-11]mm · 13 of 101 slices shown, 15 images]
[im 5/101  soft-tissue]
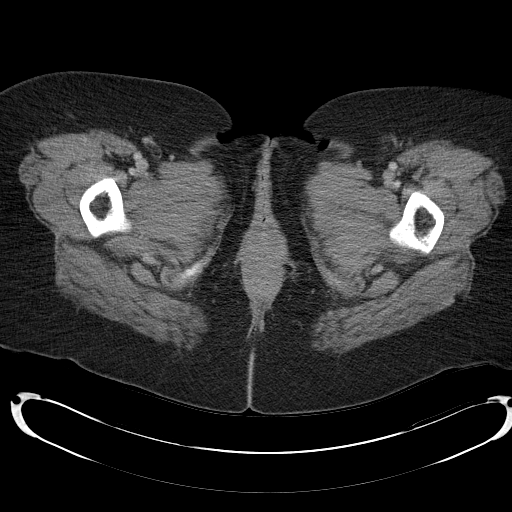
[im 5/101  bone]
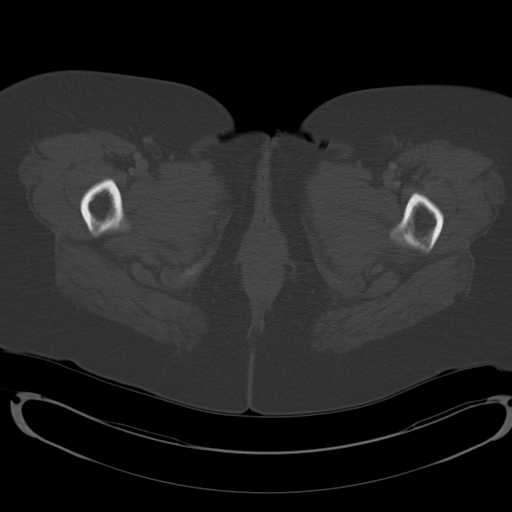
[im 13/101  soft-tissue]
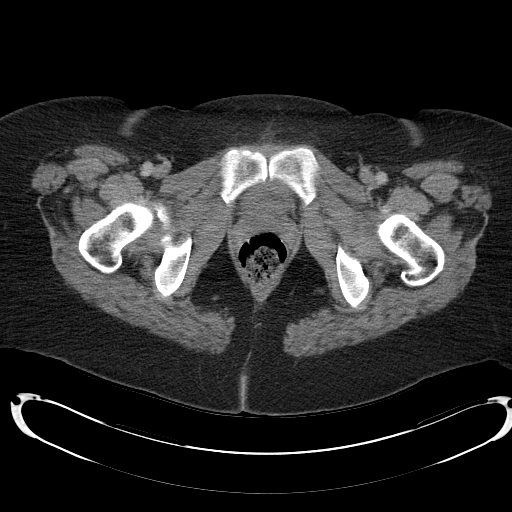
[im 21/101  soft-tissue]
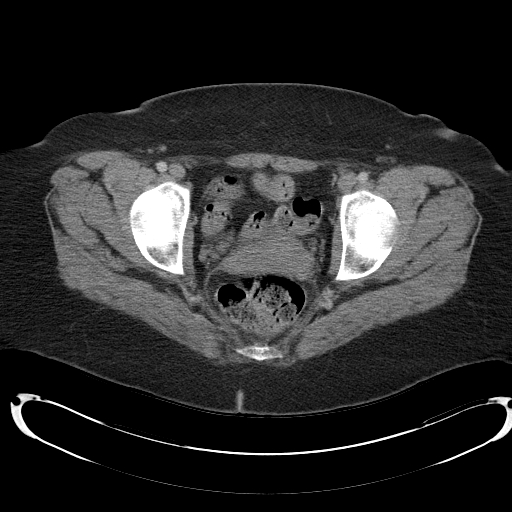
[im 30/101  soft-tissue]
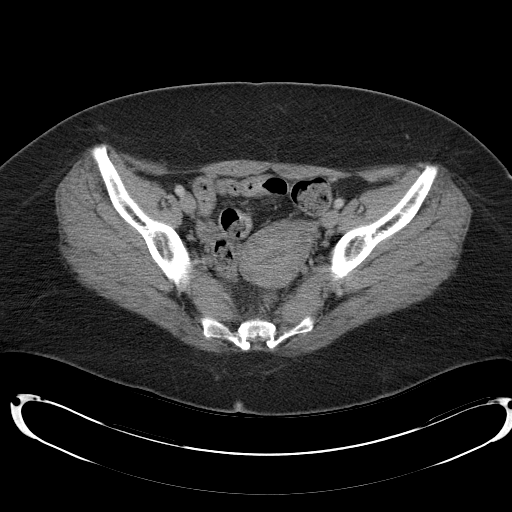
[im 34/101  soft-tissue]
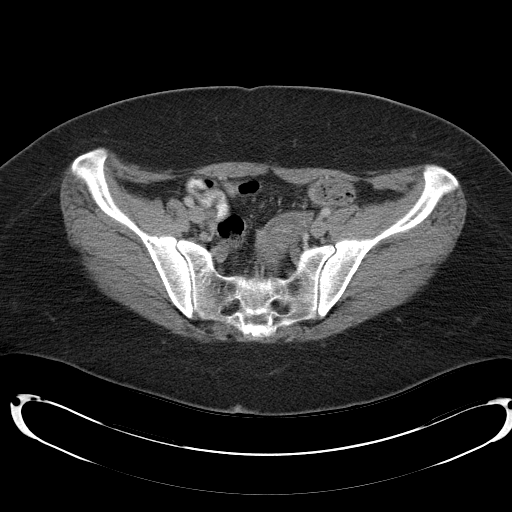
[im 42/101  soft-tissue]
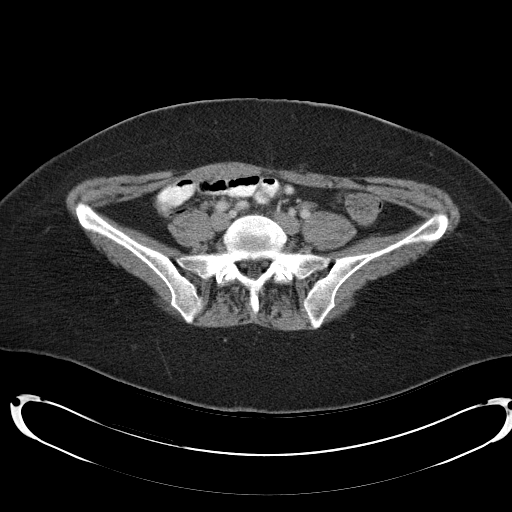
[im 51/101  soft-tissue]
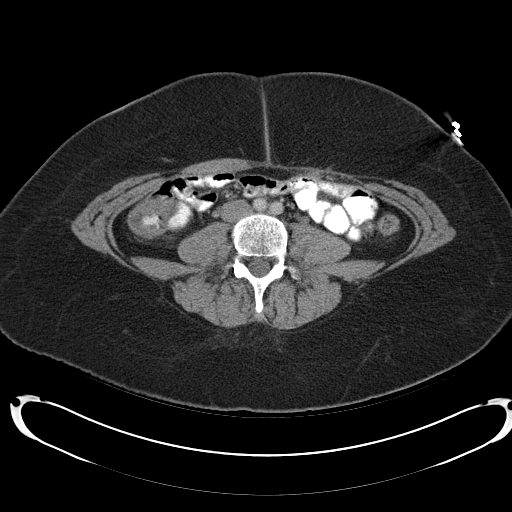
[im 59/101  soft-tissue]
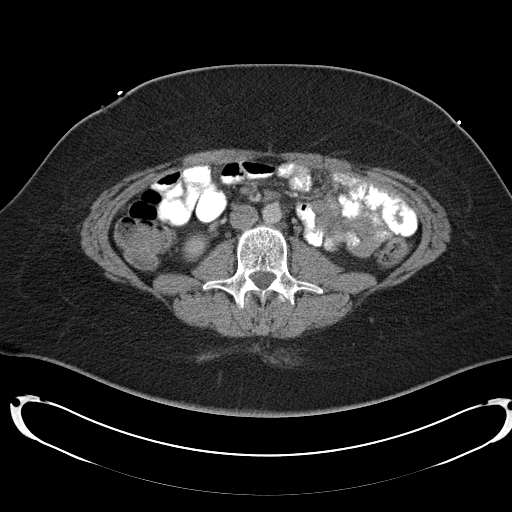
[im 67/101  soft-tissue]
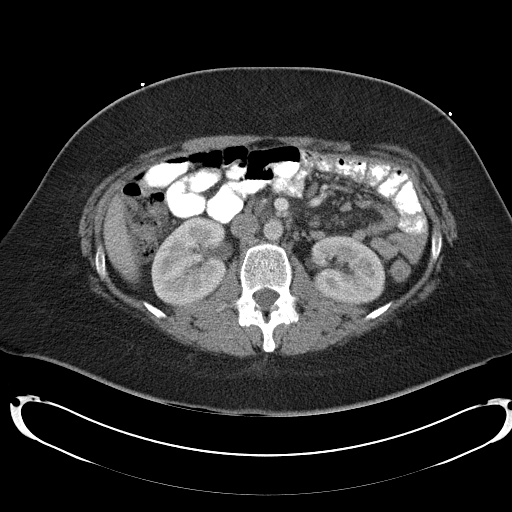
[im 67/101  bone]
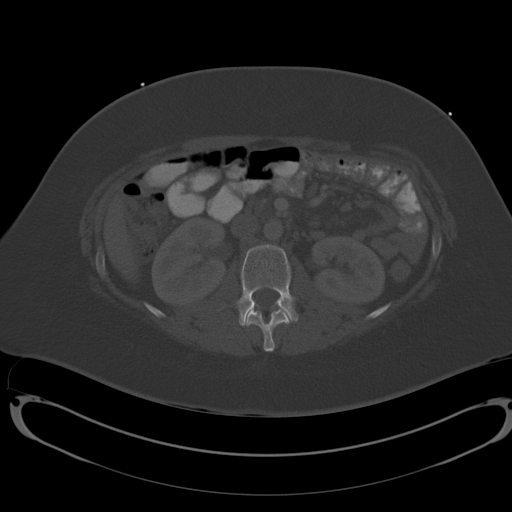
[im 71/101  soft-tissue]
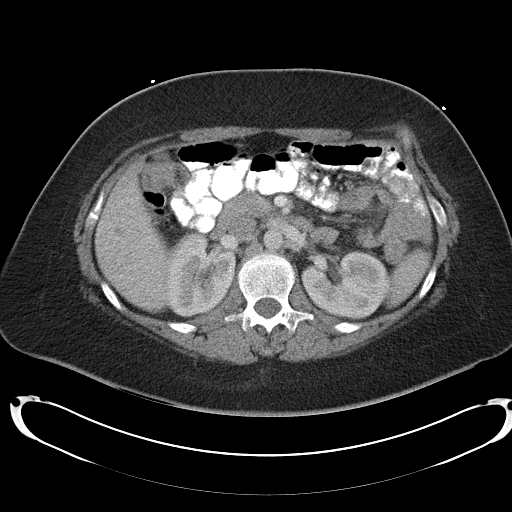
[im 80/101  soft-tissue]
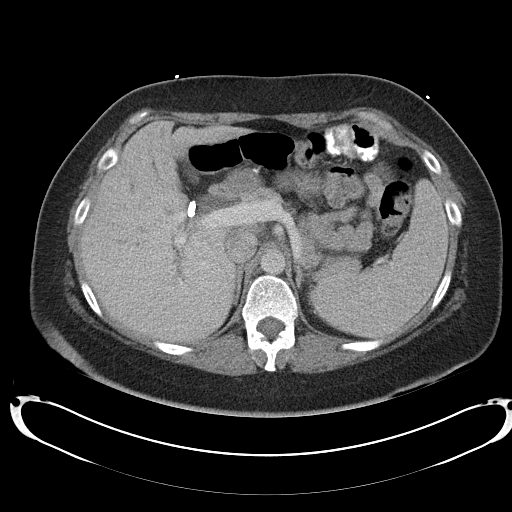
[im 88/101  soft-tissue]
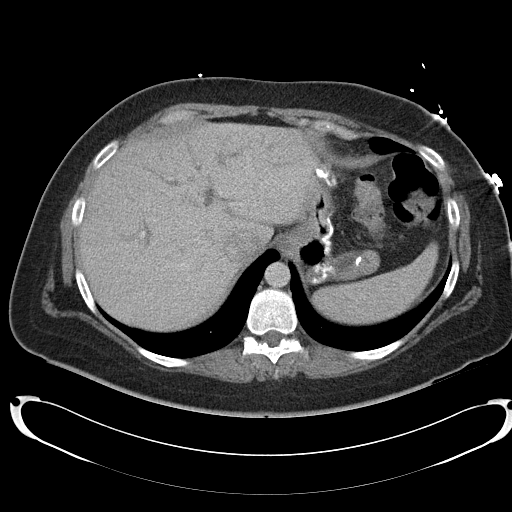
[im 96/101  soft-tissue]
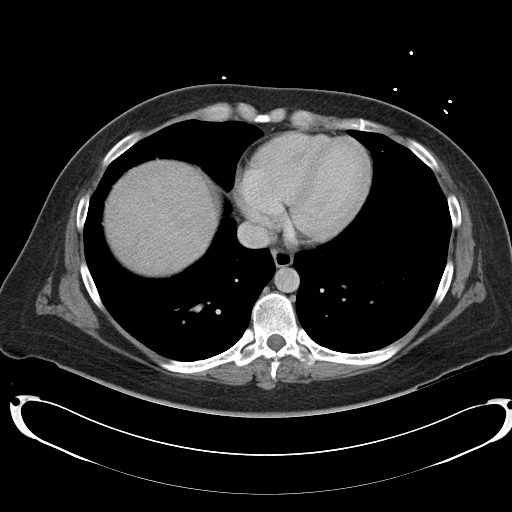

[Series 4: abd_pel_with 3.0 spo cor · coronal · 0.80mm/px · 3 of 80 slices shown]
[im 27/80  soft-tissue]
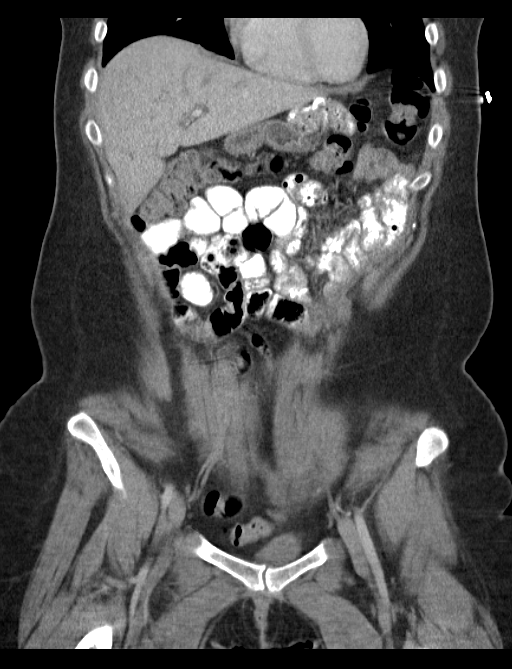
[im 36/80  soft-tissue]
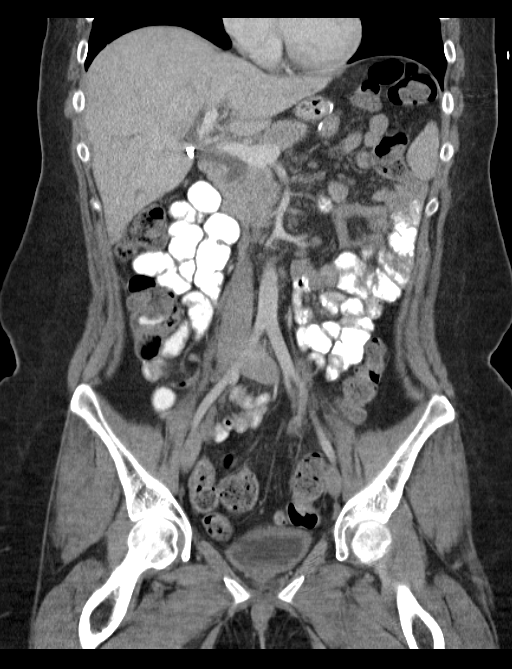
[im 44/80  soft-tissue]
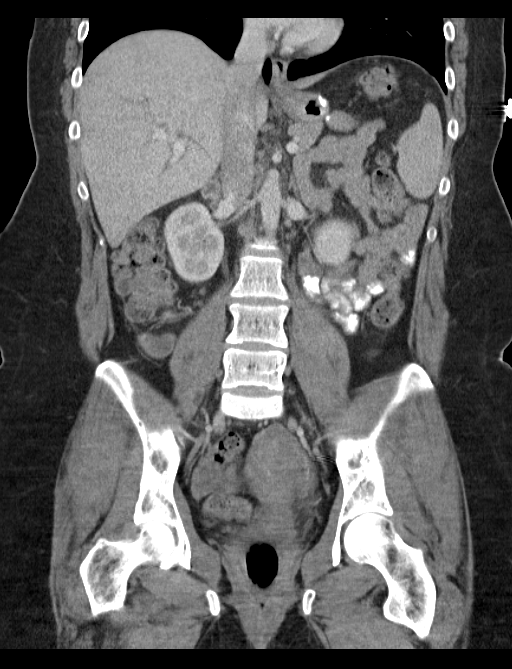

[16 of 46 positions shown; findings below may reference images not displayed]

FINDINGS: Larger lung volumes today. Lung bases are clear. No pericardial or
pleural effusion.

No acute osseous abnormality identified.

No pelvic free fluid. Negative uterus and adnexa. Negative distal
colon aside from redundancy and retained stool. Diminutive bladder.

Negative left colon aside from retained stool. Negative transverse
colon. Negative right colon aside from retained stool. Normal
appendix (series 2, image 59). Oral contrast has just reached the
cecum. No dilated or abnormal small bowel loops. Sequelae of gastric
bypass with gastrojejunostomy. Trace fluid in the distal stomach and
duodenum.

Surgically absent gallbladder. Chronic but increased intra- and
extrahepatic biliary ductal dilatation today. The CBD today measures
up to 10 mm diameter and tapers distally. No obstructing lesion is
identified. The CBD was 6-8 mm [REDACTED].
There also appears to be mild periportal edema. No abdominal free
fluid. Liver enhancement is within normal limits. Portal venous
system is patent.

Spleen, pancreas, adrenal glands, and kidneys are within normal
limits. Major arterial structures in the abdomen and pelvis are
patent. No lymphadenopathy.
IMPRESSION: 1. New mild periportal edema and intra-, extrahepatic biliary ductal
enlargement. No obstructing etiology is identified by CT.
Gallbladder is chronically absent.
2. Otherwise negative CT abdomen and pelvis status post gastric
bypass with gastrojejunostomy. Normal appendix.

## 2017-06-01 ENCOUNTER — Ambulatory Visit (INDEPENDENT_AMBULATORY_CARE_PROVIDER_SITE_OTHER): Payer: Medicaid Other | Admitting: Psychiatry

## 2017-06-01 ENCOUNTER — Encounter (HOSPITAL_COMMUNITY): Payer: Self-pay | Admitting: Psychiatry

## 2017-06-01 DIAGNOSIS — F332 Major depressive disorder, recurrent severe without psychotic features: Secondary | ICD-10-CM
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
## 2017-06-09 ENCOUNTER — Inpatient Hospital Stay (HOSPITAL_COMMUNITY)
Admission: EM | Admit: 2017-06-09 | Discharge: 2017-06-14 | DRG: 101 | Disposition: A | Discharge status: Home / Self Care | Payer: Medicaid Other | Attending: Internal Medicine | Admitting: Internal Medicine

## 2017-06-09 ENCOUNTER — Encounter (HOSPITAL_COMMUNITY): Payer: Self-pay | Admitting: Emergency Medicine

## 2017-06-09 ENCOUNTER — Emergency Department (HOSPITAL_COMMUNITY): Payer: Medicaid Other

## 2017-06-09 DIAGNOSIS — K5903 Drug induced constipation: Secondary | ICD-10-CM | POA: Diagnosis not present

## 2017-06-09 DIAGNOSIS — F1120 Opioid dependence, uncomplicated: Secondary | ICD-10-CM | POA: Diagnosis present

## 2017-06-09 DIAGNOSIS — Z9884 Bariatric surgery status: Secondary | ICD-10-CM

## 2017-06-09 DIAGNOSIS — F445 Conversion disorder with seizures or convulsions: Secondary | ICD-10-CM

## 2017-06-09 DIAGNOSIS — F410 Panic disorder [episodic paroxysmal anxiety] without agoraphobia: Secondary | ICD-10-CM | POA: Diagnosis present

## 2017-06-09 DIAGNOSIS — Z888 Allergy status to other drugs, medicaments and biological substances status: Secondary | ICD-10-CM | POA: Diagnosis not present

## 2017-06-09 DIAGNOSIS — R531 Weakness: Secondary | ICD-10-CM | POA: Diagnosis present

## 2017-06-09 DIAGNOSIS — M797 Fibromyalgia: Secondary | ICD-10-CM

## 2017-06-09 DIAGNOSIS — F329 Major depressive disorder, single episode, unspecified: Secondary | ICD-10-CM | POA: Diagnosis present

## 2017-06-09 DIAGNOSIS — M545 Low back pain, unspecified: Secondary | ICD-10-CM

## 2017-06-09 DIAGNOSIS — R569 Unspecified convulsions: Principal | ICD-10-CM

## 2017-06-09 DIAGNOSIS — T40605A Adverse effect of unspecified narcotics, initial encounter: Secondary | ICD-10-CM | POA: Diagnosis present

## 2017-06-09 DIAGNOSIS — G43909 Migraine, unspecified, not intractable, without status migrainosus: Secondary | ICD-10-CM | POA: Diagnosis present

## 2017-06-09 DIAGNOSIS — F988 Other specified behavioral and emotional disorders with onset usually occurring in childhood and adolescence: Secondary | ICD-10-CM | POA: Diagnosis present

## 2017-06-09 DIAGNOSIS — Z885 Allergy status to narcotic agent status: Secondary | ICD-10-CM

## 2017-06-09 DIAGNOSIS — F17210 Nicotine dependence, cigarettes, uncomplicated: Secondary | ICD-10-CM | POA: Diagnosis not present

## 2017-06-09 DIAGNOSIS — G609 Hereditary and idiopathic neuropathy, unspecified: Secondary | ICD-10-CM | POA: Diagnosis present

## 2017-06-09 DIAGNOSIS — Z79899 Other long term (current) drug therapy: Secondary | ICD-10-CM

## 2017-06-09 DIAGNOSIS — Z8669 Personal history of other diseases of the nervous system and sense organs: Secondary | ICD-10-CM

## 2017-06-09 DIAGNOSIS — G894 Chronic pain syndrome: Secondary | ICD-10-CM | POA: Diagnosis not present

## 2017-06-09 DIAGNOSIS — Z91040 Latex allergy status: Secondary | ICD-10-CM

## 2017-06-09 LAB — COMPREHENSIVE METABOLIC PANEL
ALBUMIN: 4.5 g/dL (ref 3.5–5.0)
ALK PHOS: 96 U/L (ref 38–126)
ALT: 56 U/L — ABNORMAL HIGH (ref 14–54)
ANION GAP: 9 (ref 5–15)
AST: 25 U/L (ref 15–41)
BILIRUBIN TOTAL: 0.6 mg/dL (ref 0.3–1.2)
BUN: 16 mg/dL (ref 6–20)
CALCIUM: 9 mg/dL (ref 8.9–10.3)
CO2: 25 mmol/L (ref 22–32)
Chloride: 106 mmol/L (ref 101–111)
Creatinine, Ser: 0.67 mg/dL (ref 0.44–1.00)
Glucose, Bld: 84 mg/dL (ref 65–99)
POTASSIUM: 3.6 mmol/L (ref 3.5–5.1)
Sodium: 140 mmol/L (ref 135–145)
TOTAL PROTEIN: 7.4 g/dL (ref 6.5–8.1)

## 2017-06-09 LAB — ETHANOL

## 2017-06-09 LAB — CBC
HCT: 43.4 % (ref 36.0–46.0)
HEMATOCRIT: 29.6 % — AB (ref 36.0–46.0)
HEMOGLOBIN: 9 g/dL — AB (ref 12.0–15.0)
Hemoglobin: 14.8 g/dL (ref 12.0–15.0)
MCH: 32.3 pg (ref 26.0–34.0)
MCH: 25.4 pg — ABNORMAL LOW (ref 26.0–34.0)
MCHC: 30.4 g/dL (ref 30.0–36.0)
MCHC: 34.1 g/dL (ref 30.0–36.0)
MCV: 94.8 fL (ref 78.0–100.0)
MCV: 83.4 fL (ref 78.0–100.0)
PLATELETS: 288 10*3/uL (ref 150–400)
Platelets: 153 10*3/uL (ref 150–400)
RBC: 3.55 MIL/uL — AB (ref 3.87–5.11)
RBC: 4.58 MIL/uL (ref 3.87–5.11)
RDW: 13.1 % (ref 11.5–15.5)
RDW: 15.6 % — ABNORMAL HIGH (ref 11.5–15.5)
WBC: 3.7 10*3/uL — AB (ref 4.0–10.5)
WBC: 8.5 10*3/uL (ref 4.0–10.5)

## 2017-06-09 LAB — RAPID URINE DRUG SCREEN, HOSP PERFORMED
AMPHETAMINES: NOT DETECTED
Barbiturates: POSITIVE — AB
Benzodiazepines: POSITIVE — AB
COCAINE: NOT DETECTED
OPIATES: NOT DETECTED
TETRAHYDROCANNABINOL: NOT DETECTED

## 2017-06-09 LAB — CBG MONITORING, ED: Glucose-Capillary: 91 mg/dL (ref 65–99)

## 2017-06-09 LAB — PREGNANCY, URINE: Preg Test, Ur: NEGATIVE

## 2017-06-09 MED ORDER — FENTANYL CITRATE (PF) 100 MCG/2ML IJ SOLN
75.0000 ug | Freq: Once | INTRAMUSCULAR | Status: AC
  Administered 2017-06-09: 75 ug via INTRAVENOUS
  Filled 2017-06-09: qty 2

## 2017-06-09 MED ORDER — MIDAZOLAM HCL 2 MG/2ML IJ SOLN
INTRAMUSCULAR | Status: AC
  Filled 2017-06-09: qty 2

## 2017-06-09 MED ORDER — CLONAZEPAM 0.5 MG PO TABS
0.5000 mg | ORAL_TABLET | Freq: Once | ORAL | Status: AC
  Administered 2017-06-09: 0.5 mg via ORAL
  Filled 2017-06-09: qty 1

## 2017-06-09 MED ORDER — MIDAZOLAM HCL 2 MG/2ML IJ SOLN
1.0000 mg | Freq: Once | INTRAMUSCULAR | Status: AC
  Administered 2017-06-09: 1 mg via INTRAVENOUS
  Filled 2017-06-09: qty 2

## 2017-06-09 MED ORDER — KETOROLAC TROMETHAMINE 30 MG/ML IJ SOLN
30.0000 mg | Freq: Once | INTRAMUSCULAR | Status: DC
  Filled 2017-06-09: qty 1

## 2017-06-09 MED ORDER — MIDAZOLAM HCL 2 MG/2ML IJ SOLN
1.0000 mg | Freq: Once | INTRAMUSCULAR | Status: AC
  Administered 2017-06-09: 1 mg via INTRAVENOUS

## 2017-06-09 MED ORDER — SODIUM CHLORIDE 0.9 % IV SOLN
INTRAVENOUS | Status: DC
Start: 2017-06-09 — End: 2017-06-10
  Administered 2017-06-09: 18:00:00 via INTRAVENOUS

## 2017-06-09 MED ORDER — LEVETIRACETAM IN NACL 1000 MG/100ML IV SOLN
1000.0000 mg | Freq: Once | INTRAVENOUS | Status: AC
  Administered 2017-06-09: 1000 mg via INTRAVENOUS
  Filled 2017-06-09: qty 100

## 2017-06-09 NOTE — ED Notes (Signed)
Pt to xray and CT

## 2017-06-09 NOTE — ED Notes (Addendum)
Pt witnessed by sitter with another pt to get up from bed, unplug monitor and seat self on floor in front of door.  When pt confronted about why she is sitting on the floor, pt responded "I want my mother."  Advised pt her mother is not here and that does not warrant why she is sitting on the floor.  Pt responded with "why are you such a bitch. I want to sit on the floor."  Security called and MD made aware.  Pt is alert and oriented.

## 2017-06-09 NOTE — ED Triage Notes (Signed)
Mother called EMS for seizure activity.  Refused any medications by ems.  Pt arrived with generalized jerking activity which resolved into staring straight ahead while blinking.

## 2017-06-09 NOTE — ED Notes (Signed)
Pt is refusing Toradol and states it makes her seizures worse.

## 2017-06-09 NOTE — ED Notes (Signed)
Pt complaining of pain everywhere after seizure

## 2017-06-09 NOTE — ED Notes (Signed)
Pt started having seizure like activity at [place]. Pushed 1mg  of Versed at 2004 with no success. Then pushed another 1mg  of Versed at 2020 and pt is started to awake.

## 2017-06-09 NOTE — ED Notes (Signed)
Pt's mother now at bedside as well as EDP.

## 2017-06-09 NOTE — ED Notes (Signed)
Pt back from xray and CT

## 2017-06-09 NOTE — ED Provider Notes (Signed)
[facility] DEPT Provider Note   CSN: [id number] Arrival date & time: 06/09/17  1652     History   Chief Complaint Chief Complaint  Patient presents with  . Seizures    HPI Penny [name] is a 31 y.o. female.  HPI Patient presents to the emergency room for evaluation of possible seizures. Patient has history of multiple medical problems including anxiety, and depression.  According to electronic medical record she also has a history of nonepileptic seizures. Patient was brought in by EMS. According to them, the patient has been having seizures for the last 30 minutes. Family members asked EMS not to give her any medications that she had an allergy to ativan.   Past Medical History:  Diagnosis Date  . ADD (attention deficit disorder)   . Anemia 2011   2o to GASTRIC BYPASS  . Anginal pain ([place])   . Anxiety   . Blood transfusion without reported diagnosis   . Chronic daily headache   . Depression   . Depression   . Dysmenorrhea 09/18/2013  . Dysrhythmia   . Elevated liver enzymes JUL 2011ALK PHOS 111-127 AST  143-267 ALT  213-321T BILI 0.6  ALB  3.7-4.06 Jun 2011 ALK PHOS 118 AST 24 ALT 42 T BILI 0.4 ALB 3.9  . Encounter for drug rehabilitation    at behavioral health for opioid addiction about 4 years ago  . Family history of adverse reaction to anesthesia    'dad had to be kept on pump for breathing for morphine'  . Fatty liver   . Fibroid 01/18/2017  . Fibromyalgia   . Gastric bypass status for obesity   . Gastritis JULY 2011  . Heart murmur   . 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 History of Holter monitoring   . Hx of opioid abuse    for about 4 years, about 4 years ago  . Interstitial cystitis   . Iron deficiency anemia 07/23/2010  . Irritable bowel syndrome 2012 DIARRHEA   JUN 2012 TTG IgA 14.9  . IUD FEB 2010  . Lupus   . Menorrhagia 07/18/2013  . Migraines   . Obesity (BMI 30-39.9) 2011 228 LBS BMI 36.8  . Ovarian cyst   .  Patient desires pregnancy 09/18/2013  . Polyneuropathy   . PONV (postoperative nausea and vomiting) seizure post-operatively  . Potassium (K) deficiency   . Pregnant 12/25/2013  . Psychiatric pseudoseizure   . RLQ abdominal pain 07/18/2013  . Sciatica of left side 11/14/2014  . 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 Stress 09/18/2013      Past Surgical History:  Procedure Laterality Date  . BIOPSY  04/10/2015   Procedure: BIOPSY;  Surgeon: Daneil Dolin, MD;  Location: [facility];  Service: Endoscopy;;  . cath self every nite     for sodium bicarb injection (discontinued 2013)  . CHOLECYSTECTOMY  2005   biliary dyskinesia  . COLONOSCOPY  JUN 2012 ABD PN/DIARRHEA WITH PROPOFOL   NL COLON  . DILATION AND CURETTAGE OF UTERUS    . DILITATION & CURRETTAGE/HYSTROSCOPY WITH NOVASURE ABLATION N/A 03/24/2017   Procedure: DILATATION & CURETTAGE/HYSTEROSCOPY WITH NOVASURE ENDOMETRIAL ABLATION;  Surgeon: Jonnie Kind, MD;  Location: [facility];  Service: Gynecology;  Laterality: N/A;  . ESOPHAGEAL DILATION N/A 04/10/2015   Procedure: ESOPHAGEAL DILATION WITH 54FR MALONEY DILATOR;  Surgeon: Daneil Dolin, MD;  Location: [facility];  Service: Endoscopy;  Laterality: N/A;  . ESOPHAGOGASTRODUODENOSCOPY    .  ESOPHAGOGASTRODUODENOSCOPY (EGD) WITH PROPOFOL N/A 04/10/2015   Procedure: ESOPHAGOGASTRODUODENOSCOPY (EGD) WITH PROPOFOL;  Surgeon: Daneil Dolin, MD;  Location: [facility];  Service: Endoscopy;  Laterality: N/A;  . ESOPHAGOGASTRODUODENOSCOPY (EGD) WITH PROPOFOL N/A 12/06/2016   Procedure: ESOPHAGOGASTRODUODENOSCOPY (EGD) WITH PROPOFOL;  Surgeon: Danie Binder, MD;  Location: [facility];  Service: Endoscopy;  Laterality: N/A;  . GAB  2007   in [place]-POUCH 5 CM  . GASTRIC BYPASS  06/2006  . HYSTEROSCOPY W/D&C N/A 09/12/2014   Procedure: DILATATION AND CURETTAGE /HYSTEROSCOPY;  Surgeon: Jonnie Kind, MD;  Location: [facility];  Service: Gynecology;  Laterality: N/A;  . LAPAROSCOPIC TUBAL LIGATION  Bilateral 03/24/2017   Procedure: LAPAROSCOPIC TUBAL LIGATION ([place] Rings);  Surgeon: Jonnie Kind, MD;  Location: [facility];  Service: Gynecology;  Laterality: Bilateral;  . REPAIR VAGINAL CUFF N/A 07/30/2014   Procedure: REPAIR VAGINAL CUFF;  Surgeon: Mora Bellman, MD;  Location: [facility];  Service: Gynecology;  Laterality: N/A;  . SAVORY DILATION  06/20/2012   Dr. Barnie Alderman gastritis/Ulcer in the mid jejunum. Empiric dilation.   . SURAL NERVE BX Left 02/25/2016   Procedure: LEFT SURAL NERVE BIOPSY;  Surgeon: Jovita Gamma, MD;  Location: [facility];  Service: Neurosurgery;  Laterality: Left;  Left sural nerve biopsy  . TONSILLECTOMY    . TONSILLECTOMY AND ADENOIDECTOMY    . UPPER GASTROINTESTINAL ENDOSCOPY  JULY 2011 NAUSEA-D125,V6, PH 25   Bx; GASTRITIS, POUCH-5 CM LONG  . WISDOM TOOTH EXTRACTION      OB History    Gravida Para Term Preterm AB Living   _0 SAB TAB Ectopic Multiple Live Births   1       2       Home Medications    Prior to Admission medications   Medication Sig Start Date End Date Taking? Authorizing Provider  albuterol (PROVENTIL HFA;VENTOLIN HFA) 108 (90 Base) MCG/ACT inhaler Inhale 2 puffs into the lungs every 6 (six) hours as needed for wheezing. 05/28/16  Yes Luking, Elayne Snare, MD  amphetamine-dextroamphetamine (ADDERALL XR) 30 MG 24 hr capsule Take 1 capsule (30 mg total) by mouth daily. Fill after 03/12/17 04/21/17  Yes Cloria Spring, MD  carbamazepine (TEGRETOL) 200 MG tablet Take two tablets at bedtime 04/21/17  Yes Cloria Spring, MD  clonazePAM (KLONOPIN) 0.5 MG tablet Take 1 tablet (0.5 mg total) by mouth 3 (three) times daily as needed for anxiety. 04/21/17 04/21/18 Yes Cloria Spring, MD  DULoxetine (CYMBALTA) 60 MG capsule Take 2 capsules (120 mg total) by mouth at bedtime. 04/21/17  Yes Cloria Spring, MD  HYDROcodone Bitartrate ER [facility] ER) 20 MG T24A Take 20 mg by mouth at bedtime.    Yes [provider]  Meth-Hyo-M Bl-Na  Phos-Ph Sal (URIBEL) 118 MG CAPS Take 1 tid 03/30/17  Yes Estill Dooms, NP  Pediatric Multivitamins-Iron (FLINTSTONES PLUS IRON PO) Take 1 tablet by mouth 2 (two) times daily.    Yes [provider]  phenazopyridine (PYRIDIUM) 100 MG tablet TAKE (1) TABLET BY MOUTH (3) TIMES A DAY AS NEEDED FOR PAIN. 04/14/17  Yes Kathyrn Drown, MD  promethazine (PHENERGAN) 25 MG tablet TAKE 1 TABLET BY MOUTH EVERY 6 HOURS AS NEEDED. 05/09/17  Yes Derrek Monaco A, NP  topiramate (TOPAMAX) 50 MG tablet Taking 1 in QAm and 2 QHS 04/21/17  Yes Cloria Spring, MD  ketorolac (TORADOL) 10 MG tablet TAKE (1) TABLET BY MOUTH EVERY  6 HOURS AS NEEDED. Patient not taking: Reported on 06/09/2017 04/12/17   Estill Dooms, NP    Family History Family History  Problem Relation Age of Onset  . Hemochromatosis Maternal Grandmother   . Migraines Maternal Grandmother   . Cancer Maternal Grandmother   . Breast cancer Maternal Grandmother   . Hypertension Father   . Diabetes Father   . Coronary artery disease Father   . Migraines Paternal Grandmother   . Breast cancer Paternal Grandmother   . Cancer Mother        breast  . Hemochromatosis Mother   . Breast cancer Mother   . Depression Mother   . Anxiety disorder Mother   . Coronary artery disease Paternal Grandfather   . Anxiety disorder Brother   . Bipolar disorder Brother   . Healthy Daughter   . Healthy Son     Social History Social History  Substance Use Topics  . Smoking status: Current Every Day Smoker    Packs/day: 0.25    Years: 6.00    Types: Cigarettes  . Smokeless tobacco: Never Used  . Alcohol use No     Allergies   Gabapentin; Metoclopramide hcl; Tramadol; Morphine and related; Nucynta [tapentadol]; Propofol; Trazodone and nefazodone; Zofran [ondansetron]; Butrans [buprenorphine]; Latex; Lyrica [pregabalin]; and Tape   Review of Systems Review of Systems  All other systems reviewed and are negative.    Physical  Exam Updated Vital Signs BP 116/81 (BP Location: Left Arm)   Pulse 78   Temp 98.3 F (36.8 C)   Resp 16   Ht 1.727 m (5' 8")   Wt 98 kg (216 lb)   SpO2 97%   BMI 32.84 kg/m   Physical Exam  HENT:  Head: Normocephalic and atraumatic.  Right Ear: External ear normal.  Left Ear: External ear normal.  Eyes: Conjunctivae are normal. Right eye exhibits no discharge. Left eye exhibits no discharge. No scleral icterus.  Neck: Neck supple. No tracheal deviation present.  Cardiovascular: Normal rate, regular rhythm and intact distal pulses.   Pulmonary/Chest: Effort normal and breath sounds normal. No stridor. No respiratory distress. She has no wheezes. She has no rales.  Abdominal: Soft. Bowel sounds are normal. She exhibits no distension. There is no tenderness. There is no rebound and no guarding.  Musculoskeletal: She exhibits no edema or tenderness.  Neurological: She is alert. Cranial nerve deficit: no facial droop, extraocular movements intact, no slurred speech. She exhibits abnormal muscle tone. She displays seizure activity.  Upon arrival pt was having seizure like activity with head turned to the side, eyes closed, I opened her eyes and started turing her head left and right, eyes seemed to deviate away from me purposefully, pt then had recurrent shaking spells, head turned to the left, arm stiff with posturing, head shaking  Skin: Skin is warm and dry. No rash noted. She is not diaphoretic.  Psychiatric: She has a normal mood and affect.  Nursing note and vitals reviewed.    ED Treatments / Results  Labs (all labs ordered are listed, but only abnormal results are displayed) Labs Reviewed  COMPREHENSIVE METABOLIC PANEL - Abnormal; Notable for the following:       Result Value   ALT 56 (*)    All other components within normal limits  CBC - Abnormal; Notable for the following:    WBC 3.7 (*)    RBC 3.55 (*)    Hemoglobin 9.0 (*)    HCT 29.6 (*)  MCH 25.4 (*)    RDW  15.6 (*)    All other components within normal limits  RAPID URINE DRUG SCREEN, HOSP PERFORMED - Abnormal; Notable for the following:    Benzodiazepines POSITIVE (*)    Barbiturates POSITIVE (*)    All other components within normal limits  ETHANOL  CBC  PREGNANCY, URINE  CBG MONITORING, ED  I-STAT BETA HCG BLOOD, ED ([facility], [facility], [facility] ONLY)      Procedures Procedures (including critical care time)  Medications Ordered in ED Medications  0.9 %  sodium chloride infusion ( Intravenous New Bag/Given 06/09/17 1820)  midazolam (VERSED) injection 1 mg (1 mg Intravenous Given 06/09/17 1706)  fentaNYL (SUBLIMAZE) injection 75 mcg (75 mcg Intravenous Given 06/09/17 1915)  midazolam (VERSED) injection 1 mg (1 mg Intravenous Given 06/09/17 2023)  levETIRAcetam (KEPPRA) IVPB 1000 mg/100 mL premix (1,000 mg Intravenous New Bag/Given 06/09/17 2101)  midazolam (VERSED) injection 1 mg (1 mg Intravenous Given 06/09/17 2020)     Initial Impression / Assessment and Plan / ED Course  I have reviewed the triage vital signs and the nursing notes.  Pertinent labs & imaging results that were available during my care of the patient were reviewed by me and considered in my medical decision making (see chart for details).  Clinical Course as of Jun 09 2116  Thu Jun 09, 2017  1714 Pt is now awake and alert.  She is sitting on the floor in front of the gurney.  She asked the nurse for her mother.  When she was told that her mother was not here the patient asked the nurse why she is being such a bithc.  [JK]  1914 Pt now states that she is having too much back pain and this has affected her ability to move her legs and walk.  Pt states she cannot move her legs here in the ED.  Previously pt had this condition after the birth of her daughter.  She was in a wheelchair for a long period of time.  She does not want to be in that situation again.  Pt also states she had been going to pain management but they are closing.   Will give  a dose of pain medications.  She has fallen within the last few weeks.  Will get plain films of her spine.  Pt states she was supposed to be getting an MRI but I am not able to get that this evening.  [JK]  1934 There may be a psychosomatic component to her inability to move her legs.  Pt did manage to get out of bed earlier on her own and get into a chair after her initial seizure presentation.  [JK]  1936 MRI in feb last year were normal.  [JK]  1951 Notified that pt is seizing again.    [JK]  2025 Pt continue to have shaking.  Will give additional versed.  Plan on admisison  [JK]    Clinical Course User Index [JK] Dorie Rank, MD   Patient presented to the emergency room initially after seizure-like activity. Patient's symptoms resolved. She then got out of bed and sat on the floor. Patient was back in the bed and started complaining of inability to move her legs. We're in the process of evaluating of her leg issues when the patient started having seizure-like activity again. Her symptoms are certainly atypical. Previously she was diagnosed with nonepileptic seizures. Her symptoms are certainly concerning for recurrent episode over the patient continues  to have seizure-like activity. She will not walk. The stomach from neurology consultation. No neurology backup available here today. I spoke with Dr. Paschal Dopp at [facility], neuro hospitalist. Patient can be admitted to [place] by the hospitalist service.   Final Clinical Impressions(s) / ED Diagnoses   Final diagnoses:  Seizure-like activity ([place])  Weakness    New Prescriptions New Prescriptions   No medications on file     Dorie Rank, MD 06/09/17 2117

## 2017-06-09 NOTE — H&P (Signed)
History and Physical    Penny [name] YNW:[id number] DOB: [birth year] DOA: 06/09/2017  PCP: Babs Sciara, MD  Patient coming from:  home  Chief Complaint:  seizure  HPI: Penny [name] is a 31 y.o. 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  Pt had several witnessed episodes in the ED by EDP who was not certain if it was a real seizure.  Pt had no postictal event.  She denies any fevers or illnesses.  She says she has not had one of these events in a year.  She has had no rashes.  She is on chronic opiates and benzos and she reports she has not run out of these.  [facility]  She has not had any definitive EEG evidence of epilepsy that I can find.    Review of Systems: As per HPI otherwise 10 point review of systems negative.   Past Medical History:  Diagnosis Date  . ADD (attention deficit disorder)   . Anemia 2011   2o to GASTRIC BYPASS  . Anginal pain (HCC)   . Anxiety   . Blood transfusion without reported diagnosis   . Chronic daily headache   . Depression   . Depression   . Dysmenorrhea 09/18/2013  . Dysrhythmia   . Elevated liver enzymes JUL 2011ALK PHOS 111-127 AST  143-267 ALT  213-321T BILI 0.6  ALB  3.7-4.06 Jun 2011 ALK PHOS 118 AST 24 ALT 42 T BILI 0.4 ALB 3.9  . Encounter for drug rehabilitation    at behavioral health for opioid addiction about 4 years ago  . Family history of adverse reaction to anesthesia    'dad had to be kept on pump for breathing for morphine'  . Fatty liver   . Fibroid 01/18/2017  . Fibromyalgia   . Gastric bypass status for obesity   . Gastritis JULY 2011  . Heart murmur   . 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 History of Holter monitoring   . Hx of opioid abuse    for about 4 years, about 4 years ago  . Interstitial cystitis   . Iron deficiency anemia  07/23/2010  . Irritable bowel syndrome 2012 DIARRHEA   JUN 2012 TTG IgA 14.9  . IUD FEB 2010  . Lupus   . Menorrhagia 07/18/2013  . Migraines   . Obesity (BMI 30-39.9) 2011 228 LBS BMI 36.8  . Ovarian cyst   . Patient desires pregnancy 09/18/2013  . Polyneuropathy   . PONV (postoperative nausea and vomiting) seizure post-operatively  . Potassium (K) deficiency   . Pregnant 12/25/2013  . Psychiatric pseudoseizure   . RLQ abdominal pain 07/18/2013  . Sciatica of left side 11/14/2014  . 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 Stress 09/18/2013    Past Surgical History:  Procedure Laterality Date  . BIOPSY  04/10/2015   Procedure: BIOPSY;  Surgeon: Corbin Ade, MD;  Location: [facility];  Service: Endoscopy;;  . cath self every nite     for sodium bicarb injection (discontinued 2013)  . CHOLECYSTECTOMY  2005   biliary dyskinesia  . COLONOSCOPY  JUN 2012 ABD PN/DIARRHEA WITH PROPOFOL   NL COLON  . DILATION AND CURETTAGE OF UTERUS    . DILITATION & CURRETTAGE/HYSTROSCOPY WITH NOVASURE ABLATION N/A 03/24/2017   Procedure: DILATATION & CURETTAGE/HYSTEROSCOPY WITH NOVASURE ENDOMETRIAL  ABLATION;  Surgeon: Tilda Burrow, MD;  Location: [facility];  Service: Gynecology;  Laterality: N/A;  . ESOPHAGEAL DILATION N/A 04/10/2015   Procedure: ESOPHAGEAL DILATION WITH 54FR MALONEY DILATOR;  Surgeon: Corbin Ade, MD;  Location: [facility];  Service: Endoscopy;  Laterality: N/A;  . ESOPHAGOGASTRODUODENOSCOPY    . ESOPHAGOGASTRODUODENOSCOPY (EGD) WITH PROPOFOL N/A 04/10/2015   Procedure: ESOPHAGOGASTRODUODENOSCOPY (EGD) WITH PROPOFOL;  Surgeon: Corbin Ade, MD;  Location: [facility];  Service: Endoscopy;  Laterality: N/A;  . ESOPHAGOGASTRODUODENOSCOPY (EGD) WITH PROPOFOL N/A 12/06/2016   Procedure: ESOPHAGOGASTRODUODENOSCOPY (EGD) WITH PROPOFOL;  Surgeon: West Bali, MD;  Location: [facility];  Service: Endoscopy;  Laterality: N/A;  . GAB  2007   in [place]-POUCH 5 CM  . GASTRIC BYPASS  06/2006    . HYSTEROSCOPY W/D&C N/A 09/12/2014   Procedure: DILATATION AND CURETTAGE /HYSTEROSCOPY;  Surgeon: Tilda Burrow, MD;  Location: [facility];  Service: Gynecology;  Laterality: N/A;  . LAPAROSCOPIC TUBAL LIGATION Bilateral 03/24/2017   Procedure: LAPAROSCOPIC TUBAL LIGATION ([place] Rings);  Surgeon: Tilda Burrow, MD;  Location: [facility];  Service: Gynecology;  Laterality: Bilateral;  . REPAIR VAGINAL CUFF N/A 07/30/2014   Procedure: REPAIR VAGINAL CUFF;  Surgeon: Catalina Antigua, MD;  Location: [facility];  Service: Gynecology;  Laterality: N/A;  . SAVORY DILATION  06/20/2012   Dr. Cyndi Bender gastritis/Ulcer in the mid jejunum. Empiric dilation.   . SURAL NERVE BX Left 02/25/2016   Procedure: LEFT SURAL NERVE BIOPSY;  Surgeon: Shirlean Kelly, MD;  Location: [facility];  Service: Neurosurgery;  Laterality: Left;  Left sural nerve biopsy  . TONSILLECTOMY    . TONSILLECTOMY AND ADENOIDECTOMY    . UPPER GASTROINTESTINAL ENDOSCOPY  JULY 2011 NAUSEA-D125,V6, PH 25   Bx; GASTRITIS, POUCH-5 CM LONG  . WISDOM TOOTH EXTRACTION       reports that she has been smoking Cigarettes.  She has a 1.50 pack-year smoking history. She has never used smokeless tobacco. She reports that she does not drink alcohol or use drugs.  Allergies  Allergen Reactions  . Gabapentin Other (See Comments)    Pt states that she was unresponsive after taking this medication, but her vitals remained stable.    . Metoclopramide Hcl Anxiety and Other (See Comments)    Pt states that she felt like she was trapped in a box, and could not get out.  Pt also states that she had temporary loss of movement, weakness, and tingling.    . Tramadol Other (See Comments)    Seizures  . Morphine And Related Other (See Comments)    Chest pain   . Nucynta [Tapentadol]     vomiting  . Propofol Nausea And Vomiting  . Trazodone And Nefazodone Other (See Comments)    Makes pt "like a zombie"  . Zofran [Ondansetron] Other (See Comments)     Migraines   . Butrans [Buprenorphine]     Patch caused rash, didn't help with pain  . Latex Rash  . Lyrica [Pregabalin]     suicidal  . Tape Itching and Rash    Please use paper tape    Family History  Problem Relation Age of Onset  . Hemochromatosis Maternal Grandmother   . Migraines Maternal Grandmother   . Cancer Maternal Grandmother   . Breast cancer Maternal Grandmother   . Hypertension Father   . Diabetes Father   . Coronary artery disease Father   . Migraines Paternal Grandmother   . Breast cancer Paternal Grandmother   .  Cancer Mother        breast  . Hemochromatosis Mother   . Breast cancer Mother   . Depression Mother   . Anxiety disorder Mother   . Coronary artery disease Paternal Grandfather   . Anxiety disorder Brother   . Bipolar disorder Brother   . Healthy Daughter   . Healthy Son     Prior to Admission medications   Medication Sig Start Date End Date Taking? Authorizing Provider  albuterol (PROVENTIL HFA;VENTOLIN HFA) 108 (90 Base) MCG/ACT inhaler Inhale 2 puffs into the lungs every 6 (six) hours as needed for wheezing. 05/28/16  Yes Luking, Jonna Coup, MD  amphetamine-dextroamphetamine (ADDERALL XR) 30 MG 24 hr capsule Take 1 capsule (30 mg total) by mouth daily. Fill after 03/12/17 04/21/17  Yes Myrlene Broker, MD  carbamazepine (TEGRETOL) 200 MG tablet Take two tablets at bedtime 04/21/17  Yes Myrlene Broker, MD  clonazePAM (KLONOPIN) 0.5 MG tablet Take 1 tablet (0.5 mg total) by mouth 3 (three) times daily as needed for anxiety. 04/21/17 04/21/18 Yes Myrlene Broker, MD  DULoxetine (CYMBALTA) 60 MG capsule Take 2 capsules (120 mg total) by mouth at bedtime. 04/21/17  Yes Myrlene Broker, MD  HYDROcodone Bitartrate ER [facility] ER) 20 MG T24A Take 20 mg by mouth at bedtime.    Yes [provider]  Meth-Hyo-M Bl-Na Phos-Ph Sal (URIBEL) 118 MG CAPS Take 1 tid 03/30/17  Yes Adline Potter, NP  Pediatric Multivitamins-Iron (FLINTSTONES PLUS IRON PO)  Take 1 tablet by mouth 2 (two) times daily.    Yes [provider]  phenazopyridine (PYRIDIUM) 100 MG tablet TAKE (1) TABLET BY MOUTH (3) TIMES A DAY AS NEEDED FOR PAIN. 04/14/17  Yes Babs Sciara, MD  promethazine (PHENERGAN) 25 MG tablet TAKE 1 TABLET BY MOUTH EVERY 6 HOURS AS NEEDED. 05/09/17  Yes Cyril Mourning A, NP  topiramate (TOPAMAX) 50 MG tablet Taking 1 in QAm and 2 QHS 04/21/17  Yes Myrlene Broker, MD  ketorolac (TORADOL) 10 MG tablet TAKE (1) TABLET BY MOUTH EVERY 6 HOURS AS NEEDED. Patient not taking: Reported on 06/09/2017 04/12/17   Adline Potter, NP    Physical Exam: Vitals:   06/09/17 1849 06/09/17 1935 06/09/17 2000 06/09/17 2059  BP: 117/71 118/64 102/83 116/81  Pulse: 84 80 81 78  Resp: 18 18 (!) 35 16  Temp:      SpO2: 100% 97% 98% 97%  Weight:      Height:        Constitutional: NAD, calm, comfortable Vitals:   06/09/17 1849 06/09/17 1935 06/09/17 2000 06/09/17 2059  BP: 117/71 118/64 102/83 116/81  Pulse: 84 80 81 78  Resp: 18 18 (!) 35 16  Temp:      SpO2: 100% 97% 98% 97%  Weight:      Height:       Eyes: PERRL, lids and conjunctivae normal ENMT: Mucous membranes are moist. Posterior pharynx clear of any exudate or lesions.Normal dentition.  Neck: normal, supple, no masses, no thyromegaly Respiratory: clear to auscultation bilaterally, no wheezing, no crackles. Normal respiratory effort. No accessory muscle use.  Cardiovascular: Regular rate and rhythm, no murmurs / rubs / gallops. No extremity edema. 2+ pedal pulses. No carotid bruits.  Abdomen: no tenderness, no masses palpated. No hepatosplenomegaly. Bowel sounds positive.  Musculoskeletal: no clubbing / cyanosis. No joint deformity upper and lower extremities. Good ROM, no contractures. Normal muscle tone.  Skin: no rashes, lesions, ulcers. No induration  Neurologic: CN 2-12 grossly intact. Sensation intact, DTR normal. Strength 5/5 in all 4.  Psychiatric: Normal judgment and insight.  Alert and oriented x 3. Normal mood.    Labs on Admission: I have personally reviewed following labs and imaging studies  CBC:  Recent Labs Lab 06/09/17 1708  WBC 8.5  3.7*  HGB 14.8  9.0*  HCT 43.4  29.6*  MCV 94.8  83.4  PLT 288  153   Basic Metabolic Panel:  Recent Labs Lab 06/09/17 1708  NA 140  K 3.6  CL 106  CO2 25  GLUCOSE 84  BUN 16  CREATININE 0.67  CALCIUM 9.0   GFR: Estimated Creatinine Clearance: 126.9 mL/min (by C-G formula based on SCr of 0.67 mg/dL). Liver Function Tests:  Recent Labs Lab 06/09/17 1708  AST 25  ALT 56*  ALKPHOS 96  BILITOT 0.6  PROT 7.4  ALBUMIN 4.5   CBG:  Recent Labs Lab 06/09/17 1655  GLUCAP 91     Radiological Exams on Admission: No results found.  Ct head neg Old chart reviewed Case discussed with edp  Assessment/Plan 31 yo female with h/o pseudoseizures comes in with possible recurrence  Principal Problem:   Seizure-like activity (HCC)- unclear if pseudoseizures.  Pt reports she is on tegretol for seizures.  She has been loaded by EDP with keppra.  Will not cont this until evaluated by neuro.  [facility] for [facility] evaluation, further work up and treatment per them.  Have written order for RN to call them on arrival to [facility].  Place on seizure precautions.  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  Try not to escalate controlled substance use.  Pt refused toradol in ED.   DVT prophylaxis:  scds Code Status:  full Family Communication:  mother Disposition Plan:  Per day team Consults called:  [facility]uro at [facility] Admission status:  observation   Leilanni Halvorson A MD [facility]  If  7PM-7AM, please contact night-coverage [email or web address] Password [email or web address]  06/09/2017, 9:30 PM

## 2017-06-09 NOTE — ED Notes (Signed)
Dr. Tomi Bamberger called to assess pt on arrival and order received for meds.

## 2017-06-09 NOTE — ED Notes (Signed)
Pt is now waking up.

## 2017-06-09 NOTE — ED Notes (Signed)
Pt is now in the floor and refuses to get back into her bed. After trying to talk with pt she stated, "Get your fu**ing hand off me. I can fight with the best of you." MD made aware of the situation.

## 2017-06-10 ENCOUNTER — Observation Stay (HOSPITAL_COMMUNITY): Payer: Medicaid Other

## 2017-06-10 DIAGNOSIS — R531 Weakness: Secondary | ICD-10-CM | POA: Diagnosis not present

## 2017-06-10 DIAGNOSIS — F988 Other specified behavioral and emotional disorders with onset usually occurring in childhood and adolescence: Secondary | ICD-10-CM | POA: Diagnosis present

## 2017-06-10 DIAGNOSIS — T40605A Adverse effect of unspecified narcotics, initial encounter: Secondary | ICD-10-CM | POA: Diagnosis present

## 2017-06-10 DIAGNOSIS — G894 Chronic pain syndrome: Secondary | ICD-10-CM | POA: Diagnosis present

## 2017-06-10 DIAGNOSIS — G609 Hereditary and idiopathic neuropathy, unspecified: Secondary | ICD-10-CM | POA: Diagnosis present

## 2017-06-10 DIAGNOSIS — F329 Major depressive disorder, single episode, unspecified: Secondary | ICD-10-CM | POA: Diagnosis present

## 2017-06-10 DIAGNOSIS — K5903 Drug induced constipation: Secondary | ICD-10-CM | POA: Diagnosis present

## 2017-06-10 DIAGNOSIS — M797 Fibromyalgia: Secondary | ICD-10-CM | POA: Diagnosis present

## 2017-06-10 DIAGNOSIS — Z9884 Bariatric surgery status: Secondary | ICD-10-CM | POA: Diagnosis not present

## 2017-06-10 DIAGNOSIS — Z560 Unemployment, unspecified: Secondary | ICD-10-CM | POA: Diagnosis not present

## 2017-06-10 DIAGNOSIS — Z91040 Latex allergy status: Secondary | ICD-10-CM | POA: Diagnosis not present

## 2017-06-10 DIAGNOSIS — Z888 Allergy status to other drugs, medicaments and biological substances status: Secondary | ICD-10-CM | POA: Diagnosis not present

## 2017-06-10 DIAGNOSIS — Z818 Family history of other mental and behavioral disorders: Secondary | ICD-10-CM | POA: Diagnosis not present

## 2017-06-10 DIAGNOSIS — Z79899 Other long term (current) drug therapy: Secondary | ICD-10-CM | POA: Diagnosis not present

## 2017-06-10 DIAGNOSIS — Z885 Allergy status to narcotic agent status: Secondary | ICD-10-CM | POA: Diagnosis not present

## 2017-06-10 DIAGNOSIS — F17210 Nicotine dependence, cigarettes, uncomplicated: Secondary | ICD-10-CM | POA: Diagnosis present

## 2017-06-10 DIAGNOSIS — G43909 Migraine, unspecified, not intractable, without status migrainosus: Secondary | ICD-10-CM | POA: Diagnosis present

## 2017-06-10 DIAGNOSIS — R569 Unspecified convulsions: Secondary | ICD-10-CM | POA: Diagnosis present

## 2017-06-10 DIAGNOSIS — F410 Panic disorder [episodic paroxysmal anxiety] without agoraphobia: Secondary | ICD-10-CM | POA: Diagnosis present

## 2017-06-10 DIAGNOSIS — F445 Conversion disorder with seizures or convulsions: Secondary | ICD-10-CM | POA: Diagnosis not present

## 2017-06-10 MED ORDER — CARBAMAZEPINE 200 MG PO TABS
400.0000 mg | ORAL_TABLET | Freq: Every day | ORAL | Status: DC
  Administered 2017-06-10 – 2017-06-13 (×4): 400 mg via ORAL
  Filled 2017-06-10 (×4): qty 2

## 2017-06-10 MED ORDER — DIPHENHYDRAMINE HCL 25 MG PO CAPS
25.0000 mg | ORAL_CAPSULE | Freq: Three times a day (TID) | ORAL | Status: DC | PRN
  Administered 2017-06-10 – 2017-06-11 (×2): 25 mg via ORAL
  Filled 2017-06-10 (×2): qty 1

## 2017-06-10 MED ORDER — HYDROCODONE-ACETAMINOPHEN 5-325 MG PO TABS
1.0000 | ORAL_TABLET | ORAL | Status: DC | PRN
  Administered 2017-06-10 – 2017-06-13 (×12): 2 via ORAL
  Filled 2017-06-10 (×14): qty 2

## 2017-06-10 MED ORDER — CLONAZEPAM 0.5 MG PO TABS
0.5000 mg | ORAL_TABLET | Freq: Three times a day (TID) | ORAL | Status: DC | PRN
  Administered 2017-06-10 – 2017-06-14 (×10): 0.5 mg via ORAL
  Filled 2017-06-10 (×10): qty 1

## 2017-06-10 MED ORDER — SODIUM CHLORIDE 0.9% FLUSH
3.0000 mL | Freq: Two times a day (BID) | INTRAVENOUS | Status: DC
  Administered 2017-06-10 – 2017-06-13 (×5): 3 mL via INTRAVENOUS

## 2017-06-10 MED ORDER — SODIUM CHLORIDE 0.9 % IV SOLN: 250.0000 mL | INTRAVENOUS | Status: DC | PRN

## 2017-06-10 MED ORDER — SODIUM CHLORIDE 0.9% FLUSH: 3.0000 mL | INTRAVENOUS | Status: DC | PRN

## 2017-06-10 MED ORDER — MIDAZOLAM HCL 2 MG/2ML IJ SOLN
2.0000 mg | Freq: Once | INTRAMUSCULAR | Status: AC
  Administered 2017-06-11: 2 mg via INTRAVENOUS
  Filled 2017-06-10: qty 2

## 2017-06-10 MED ORDER — DULOXETINE HCL 60 MG PO CPEP
120.0000 mg | ORAL_CAPSULE | Freq: Every day | ORAL | Status: DC
  Administered 2017-06-10 – 2017-06-13 (×4): 120 mg via ORAL
  Filled 2017-06-10 (×4): qty 2

## 2017-06-10 MED ORDER — LORAZEPAM 2 MG/ML IJ SOLN
2.0000 mg | Freq: Once | INTRAMUSCULAR | Status: AC | PRN
  Administered 2017-06-11: 2 mg via INTRAVENOUS
  Filled 2017-06-10: qty 1

## 2017-06-10 MED ORDER — PROMETHAZINE HCL 25 MG/ML IJ SOLN
12.5000 mg | Freq: Four times a day (QID) | INTRAMUSCULAR | Status: DC | PRN
  Administered 2017-06-10 – 2017-06-13 (×8): 12.5 mg via INTRAVENOUS
  Filled 2017-06-10 (×8): qty 1

## 2017-06-10 MED ORDER — HYDROMORPHONE HCL 1 MG/ML IJ SOLN
1.0000 mg | INTRAMUSCULAR | Status: DC | PRN
  Administered 2017-06-10 – 2017-06-13 (×14): 1 mg via INTRAVENOUS
  Filled 2017-06-10 (×14): qty 1

## 2017-06-10 MED ORDER — KETOROLAC TROMETHAMINE 15 MG/ML IJ SOLN
15.0000 mg | Freq: Four times a day (QID) | INTRAMUSCULAR | Status: DC | PRN
  Filled 2017-06-10: qty 1

## 2017-06-10 MED ORDER — LIDOCAINE 5 % EX PTCH
1.0000 | MEDICATED_PATCH | CUTANEOUS | Status: DC
  Administered 2017-06-11: 1 via TRANSDERMAL
  Filled 2017-06-10 (×2): qty 1

## 2017-06-10 MED ORDER — AMPHETAMINE-DEXTROAMPHET ER 10 MG PO CP24
30.0000 mg | ORAL_CAPSULE | Freq: Every day | ORAL | Status: DC
  Administered 2017-06-14: 30 mg via ORAL
  Filled 2017-06-10 (×6): qty 3

## 2017-06-10 NOTE — Progress Notes (Signed)
Pt was brought down to MRI for spinal exams, pt very talkative, cooperative while setting her up.  Pt was given a 'squeeze ball' to signal [name] if she needed something and felt we couldn't hear her.  A few minutes into the exam, the alarm went off and I paused the scan and asked the patient over the intercom if she was okay.  She did not respond but I heard a clicking noise coming over the intercom.  Went in room, moved table out of the scanner and pt was laying with her head to the side and her extremities were very rigid, left arm flexed, right arm extended and with severely clenched fists, her head was spasming back and forth and the clicking I heard was pt's tongue in the back of her mouth.  Pt did not respond to gentle questioning if she was okay.  This spasm lasted for about 2 minutes, pt paused, relaxed for about 30 seconds and started spasming again with flexed upper extremities, clenched fists and head.  She did this 2 more times in the scanner.  Pt's nurse was called and asked due to these events if she could come down and escort her up to the floor.  She agreed.  When she came down pt was still having spasms on her hospital bed.  Pt taken up to the floor and will try exams later when pt is more stable.

## 2017-06-10 NOTE — Progress Notes (Signed)
PT Cancellation Note  Patient Details Name: ROTUNDA WORDEN MRN: [id number] DOB: 1986/07/16   Cancelled Treatment:    Reason Eval/Treat Not Completed: Medical issues which prohibited therapy Pt with recent seizure activity and awaiting lumbar and thoracic MRI to clear spine secondary to new onset weakness and numbness. Will hold until medically appropriate.   Leighton Ruff, PT, DPT  Acute Rehabilitation Services  Pager: [phone]    Rudean Hitt 06/10/2017, 2:06 PM

## 2017-06-10 NOTE — Progress Notes (Signed)
Pt seen earlier by Dr. Paschal Dopp.   She complains of bialteral LE weakness. When I ask her to push down into the bed with her right leg, she is able to , but when I ask her to leift her left leg, there is no downward pressure in the right(+hoover's sign). She states taht she has no feeling in the leg, but does flex to noxious stimulus(though difficult because of subtle movement, could be mild flexion). Given these findings(which she states have been ongoing for > 24 hours), I do think imaging of the spine is needed, specifically, L and T-spine.   I have high concern for conversion reaction, both with breakthrough spells and leg weakness in the setting of running out of pain medicine three days ago.   Roland Rack, MD [facility] [phone]  If 7pm- 7am, please page neurology on call as listed in [place].

## 2017-06-10 NOTE — Consult Note (Signed)
NEURO HOSPITALIST CONSULT NOTE   Requestig physician: Dr. Shanon Brow   Reason for Consult:seizures    History obtained from:    Patient and Chart     HPI:                                                                                                                                          Penny [name] is an 31 y.o. female is a 31 y.o. 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 Pt stated that her seizures has been very well controlled during last year, she reported no fevers, n/v or any change in her usual health recently.     Past Medical History:  Diagnosis Date  . ADD (attention deficit disorder)   . Anemia 2011   2o to GASTRIC BYPASS  . Anginal pain ([place])   . Anxiety   . Blood transfusion without reported diagnosis   . Chronic daily headache   . Depression   . Depression   . Dysmenorrhea 09/18/2013  . Dysrhythmia   . Elevated liver enzymes JUL 2011ALK PHOS 111-127 AST  143-267 ALT  213-321T BILI 0.6  ALB  3.7-4.06 Jun 2011 ALK PHOS 118 AST 24 ALT 42 T BILI 0.4 ALB 3.9  . Encounter for drug rehabilitation    at behavioral health for opioid addiction about 4 years ago  . Family history of adverse reaction to anesthesia    'dad had to be kept on pump for breathing for morphine'  . Fatty liver   . Fibroid 01/18/2017  . Fibromyalgia   . Gastric bypass status for obesity   . Gastritis JULY 2011  . Heart murmur   . 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 History of Holter monitoring   . Hx of opioid abuse    for about 4 years, about 4 years ago  . Interstitial cystitis   . Iron deficiency anemia 07/23/2010  . Irritable bowel syndrome 2012 DIARRHEA   JUN 2012 TTG IgA 14.9  . IUD FEB 2010  . Lupus   .  Menorrhagia 07/18/2013  . Migraines   . Obesity (BMI 30-39.9) 2011 228 LBS BMI 36.8  . Ovarian cyst   . Patient desires pregnancy 09/18/2013  . Polyneuropathy   . PONV (postoperative nausea and vomiting) seizure post-operatively  . Potassium (K) deficiency   . Pregnant 12/25/2013  . Psychiatric pseudoseizure   . RLQ abdominal pain 07/18/2013  . Sciatica of left side 11/14/2014  . Seizures ([place]) 07/31/2014   non-epileptic  . Stress 09/18/2013    Past Surgical History:  Procedure Laterality Date  . BIOPSY  04/10/2015   Procedure: BIOPSY;  Surgeon: Daneil Dolin, MD;  Location: [facility];  Service: Endoscopy;;  . cath self every nite     for sodium bicarb injection (discontinued 2013)  . CHOLECYSTECTOMY  2005   biliary dyskinesia  . COLONOSCOPY  JUN 2012 ABD PN/DIARRHEA WITH PROPOFOL   NL COLON  . DILATION AND CURETTAGE OF UTERUS    . DILITATION & CURRETTAGE/HYSTROSCOPY WITH NOVASURE ABLATION N/A 03/24/2017   Procedure: DILATATION & CURETTAGE/HYSTEROSCOPY WITH NOVASURE ENDOMETRIAL ABLATION;  Surgeon: Jonnie Kind, MD;  Location: [facility];  Service: Gynecology;  Laterality: N/A;  . ESOPHAGEAL DILATION N/A 04/10/2015   Procedure: ESOPHAGEAL DILATION WITH 54FR MALONEY DILATOR;  Surgeon: Daneil Dolin, MD;  Location: [facility];  Service: Endoscopy;  Laterality: N/A;  . ESOPHAGOGASTRODUODENOSCOPY    . ESOPHAGOGASTRODUODENOSCOPY (EGD) WITH PROPOFOL N/A 04/10/2015   Procedure: ESOPHAGOGASTRODUODENOSCOPY (EGD) WITH PROPOFOL;  Surgeon: Daneil Dolin, MD;  Location: [facility];  Service: Endoscopy;  Laterality: N/A;  . ESOPHAGOGASTRODUODENOSCOPY (EGD) WITH PROPOFOL N/A 12/06/2016   Procedure: ESOPHAGOGASTRODUODENOSCOPY (EGD) WITH PROPOFOL;  Surgeon: Danie Binder, MD;  Location: [facility];  Service: Endoscopy;  Laterality: N/A;  . GAB  2007   in [place]-POUCH 5 CM  . GASTRIC BYPASS  06/2006  . HYSTEROSCOPY W/D&C N/A 09/12/2014   Procedure: DILATATION AND CURETTAGE /HYSTEROSCOPY;  Surgeon: Jonnie Kind, MD;  Location: [facility];  Service: Gynecology;  Laterality: N/A;  . LAPAROSCOPIC TUBAL LIGATION Bilateral 03/24/2017   Procedure: LAPAROSCOPIC TUBAL LIGATION ([place] Rings);  Surgeon: Jonnie Kind, MD;  Location: [facility];  Service: Gynecology;  Laterality: Bilateral;  . REPAIR VAGINAL CUFF N/A 07/30/2014   Procedure: REPAIR VAGINAL CUFF;  Surgeon: Mora Bellman, MD;  Location: [facility];  Service: Gynecology;  Laterality: N/A;  . SAVORY DILATION  06/20/2012   Dr. Barnie Alderman gastritis/Ulcer in the mid jejunum. Empiric dilation.   . SURAL NERVE BX Left 02/25/2016   Procedure: LEFT SURAL NERVE BIOPSY;  Surgeon: Jovita Gamma, MD;  Location: [facility];  Service: Neurosurgery;  Laterality: Left;  Left sural nerve biopsy  . TONSILLECTOMY    . TONSILLECTOMY AND ADENOIDECTOMY    . UPPER GASTROINTESTINAL ENDOSCOPY  JULY 2011 NAUSEA-D125,V6, PH 25   Bx; GASTRITIS, POUCH-5 CM LONG  . WISDOM TOOTH EXTRACTION      Family History  Problem Relation Age of Onset  . Hemochromatosis Maternal Grandmother   . Migraines Maternal Grandmother   . Cancer Maternal Grandmother   . Breast cancer Maternal Grandmother   . Hypertension Father   . Diabetes Father   . Coronary artery disease Father   . Migraines Paternal Grandmother   . Breast cancer Paternal Grandmother   . Cancer Mother        breast  . Hemochromatosis Mother   . Breast cancer Mother   . Depression Mother   . Anxiety disorder Mother   . Coronary artery disease Paternal Grandfather   . Anxiety disorder Brother   . Bipolar disorder Brother   . Healthy Daughter   . Healthy Son      Social History:  reports that she has been smoking Cigarettes.  She has a 1.50 pack-year smoking history. She has never used smokeless tobacco. She reports that she does not drink alcohol or use drugs.  Allergies  Allergen Reactions  . Gabapentin Other (See Comments)    Pt states that she was unresponsive after taking this medication, but her  vitals remained stable.    . Metoclopramide Hcl Anxiety and Other (See Comments)    Pt states that she felt like she was trapped in a box, and could not get out.  Pt also states that she had temporary loss of movement, weakness, and tingling.    . Tramadol Other (See Comments)    Seizures  . Nucynta [Tapentadol]     vomiting  . Propofol Nausea And Vomiting  . Trazodone And Nefazodone Other (See Comments)    Makes pt "like a zombie"  . Zofran [Ondansetron] Other (See Comments)    Migraines   . Butrans [Buprenorphine]     Patch caused rash, didn't help with pain  . Latex Rash  . Lyrica [Pregabalin]     suicidal  . Tape Itching and Rash    Please use paper tape    MEDICATIONS:                                                                                                                     I have reviewed the patient's current medications.   ROS:                                                                                                                                       History obtained from patient   General ROS: negative for - chills, fatigue, fever, night sweats, weight gain or weight loss Psychological ROS: negative for - behavioral disorder, hallucinations, memory difficulties, mood swings or suicidal ideation Ophthalmic ROS: negative for - blurry vision, double vision, eye pain or loss of vision ENT ROS: negative for - epistaxis, nasal discharge, oral lesions, sore throat, tinnitus or vertigo Allergy and Immunology ROS: negative for - hives or itchy/watery eyes Hematological and Lymphatic ROS: negative for - bleeding problems, bruising or swollen lymph nodes Endocrine ROS: negative for - galactorrhea, hair pattern changes, polydipsia/polyuria or temperature intolerance Respiratory ROS: negative for - cough, hemoptysis, shortness of breath or wheezing Cardiovascular ROS: negative for - chest pain, dyspnea on exertion, edema or irregular heartbeat Gastrointestinal ROS:  negative for - abdominal pain, diarrhea, hematemesis, nausea/vomiting or stool incontinence Genito-Urinary ROS: negative for - dysuria, hematuria, incontinence or urinary frequency/urgency Musculoskeletal ROS: negative for - joint swelling or muscular weakness Neurological ROS: as noted in HPI Dermatological ROS: negative for rash and skin lesion changes   Blood pressure (!) 143/94, pulse 70, temperature 97.7 F (36.5  C), temperature source Axillary, resp. rate 20, height 5' 6" (1.676 m), weight 94.4 kg (208 lb 1.6 oz), SpO2 99 %.   Neurologic Examination:                                                                                                      HEENT-  Normocephalic, no lesions, without obvious abnormality.  Normal external eye and conjunctiva.  Normal TM's bilaterally.  Normal auditory canals and external ears. Normal external nose, mucus membranes and septum.  Normal pharynx. Cardiovascular- RRR, S1S2 Lungs- cta Abdomen-soft NT ND  Extremities-NO EDEMA  Neurological Examination Mental Status: Alert, oriented, thought content appropriate.  Speech fluent without evidence of aphasia.  Able to follow 3 step commands without difficulty. Cranial Nerves: II: Discs flat bilaterally; Visual fields grossly normal,  III,IV, VI: ptosis not present, extra-ocular motions intact bilaterally pupils equal, round, reactive to light and accommodation V,VII: smile symmetric, facial light touch sensation normal bilaterally VIII: hearing normal bilaterally IX,X: uvula rises symmetrically XI: bilateral shoulder shrug XII: midline tongue extension Motor: Right : Upper extremity   5/5    Left:     Upper extremity   5/5  Lower extremity   5/5     Lower extremity   5/5 Tone and bulk:normal tone throughout; no atrophy noted Sensory: Pinprick and light touch intact throughout, bilaterally Deep Tendon Reflexes: 2+ and symmetric throughout Plantars: Right: downgoing   Left:  downgoing Cerebellar: normal finger-to-nose, normal rapid alternating movements and normal heel-to-shin test       Lab Results: Basic Metabolic Panel:  Recent Labs Lab 06/09/17 1708  NA 140  K 3.6  CL 106  CO2 25  GLUCOSE 84  BUN 16  CREATININE 0.67  CALCIUM 9.0    Liver Function Tests:  Recent Labs Lab 06/09/17 1708  AST 25  ALT 56*  ALKPHOS 96  BILITOT 0.6  PROT 7.4  ALBUMIN 4.5   No results for input(s): LIPASE, AMYLASE in the last 168 hours. No results for input(s): AMMONIA in the last 168 hours.  CBC:  Recent Labs Lab 06/09/17 1708  WBC 8.5  3.7*  HGB 14.8  9.0*  HCT 43.4  29.6*  MCV 94.8  83.4  PLT 288  153    Cardiac Enzymes: No results for input(s): CKTOTAL, CKMB, CKMBINDEX, TROPONINI in the last 168 hours.  Lipid Panel: No results for input(s): CHOL, TRIG, HDL, CHOLHDL, VLDL, LDLCALC in the last 168 hours.  CBG:  Recent Labs Lab 06/09/17 1655  GLUCAP 91    Microbiology: Results for orders placed or performed in visit on 03/30/17  Urine culture     Status: Abnormal   Collection Time: 03/30/17  4:00 PM  Result Value Ref Range Status   Urine Culture, Routine Final report (A)  Final   Organism ID, Bacteria Escherichia coli (A)  Final    Comment: Greater than 100,000 colony forming units per mL Cefazolin with an MIC <=16 predicts susceptibility to the oral agents cefaclor, cefdinir, cefpodoxime, cefprozil, cefuroxime, cephalexin, and loracarbef when used for therapy of uncomplicated urinary tract  infections due to E. coli, Klebsiella pneumoniae, and Proteus mirabilis.    Antimicrobial Susceptibility Comment  Final    Comment:       ** S = Susceptible; I = Intermediate; R = Resistant **                    P = Positive; N = Negative             MICS are expressed in micrograms per mL    Antibiotic                 RSLT#1    RSLT#2    RSLT#3    RSLT#4 Amoxicillin/Clavulanic Acid    I Ampicillin                     R Cefazolin                       S Cefepime                       S Ceftriaxone                    S Cefuroxime                     S Cephalothin                    I Ciprofloxacin                  S Ertapenem                      S Gentamicin                     S Imipenem                       S Levofloxacin                   S Nitrofurantoin                 S Piperacillin                   R Tetracycline                   S Tobramycin                     S Trimethoprim/Sulfa             S   Microscopic Examination     Status: Abnormal   Collection Time: 03/30/17  4:00 PM  Result Value Ref Range Status   WBC, UA >30 (A) 0 - 5 /hpf Final    Comment: Clumps of leukocytes present.   RBC, UA 3-10 (A) 0 - 2 /hpf Final   Epithelial Cells (non renal) 0-10 0 - 10 /hpf Final   Casts None seen None seen /lpf Final   Mucus, UA Present Not Estab. Final   Bacteria, UA Few None seen/Few Final    Coagulation Studies: No results for input(s): LABPROT, INR in the last 72 hours.  Imaging: Dg Lumbar Spine Complete  Result Date: 06/09/2017 CLINICAL DATA:  Low back pain and leg numbness. EXAM: LUMBAR SPINE - COMPLETE 4+ VIEW COMPARISON:  None. FINDINGS: There is no evidence of  lumbar spine fracture. Alignment is normal. Intervertebral disc spaces are maintained. IMPRESSION: Negative. Electronically Signed   By: Dorise Bullion III M.D   On: 06/09/2017 22:18   Ct Head Wo Contrast  Result Date: 06/09/2017 CLINICAL DATA:  31 year old female with headache and probable seizure. EXAM: CT HEAD WITHOUT CONTRAST TECHNIQUE: Contiguous axial images were obtained from the base of the skull through the vertex without intravenous contrast. COMPARISON:  11/15/2015 CT and 01/23/2016 MR FINDINGS: Brain: No evidence of infarction, hemorrhage, hydrocephalus, extra-axial collection or mass lesion/mass effect. Vascular: No hyperdense vessel or unexpected calcification. Skull: Normal. Negative for fracture or focal lesion.  Sinuses/Orbits: No acute finding. Other: None. IMPRESSION: Unremarkable noncontrast head CT. Electronically Signed   By: Margarette Canada M.D.   On: 06/09/2017 22:03        Assessment/Plan:   31 Yo female with PMH significant for non-epileptic sz presented with multiple shaking episodes with no p[ost-ictal states -non-epileptic seizures -continue tegretol -routine EEG - psychiatry consult -Avoid benzos -UDS -PT/OT   Arnaldo Natal, MD

## 2017-06-10 NOTE — Progress Notes (Signed)
Patient ID: Penny [name], female   DOB: February 18, 1986, 31 y.o.   MRN: [id number]    PROGRESS NOTE  Penny [name]  FGH:[id number] DOB: 10/07/1986 DOA: 06/09/2017  PCP: Kathyrn Drown, MD   Brief Narrative:  31 yo female with chronic pain in neck, arms and legs, following with pain clinic, pseudoseizures, ADD, presented to [facility] ED after an episodes of seizure like activity. Patient says she has ran out of pain medications three days prior to this admission, she was not able to get an appointment soon enough for refill. After the event, pt remained weak and concerned with loss of sensation and weakness in bilateral lower extremities.  Assessment & Plan:   Principal Problem:   Seizure-like activity ([place]) - appears to be pseudoseizures as pt is rather alert after the event with no clear post ictal phase - unable to perform MRI this AM due to another episode - neurology team following - may try dose of Versed if pt experiences another episode - continue home regimen with tegretol   Active Problems:   Bilateral LE weakness and loss of sensation - unclear etiology, pt is able to bend her knees and withdraws to noxious stimuli  - plan was to obtain MRI lumbar and thoracic spine for further evaluation but this was held due to recurrent event  - will attempt to try it again  - appreciate neurology team following     History of migraine headaches - requested IV pain medications this AM - I have discussed this with pt and mom at bedside - caution with narcotic use    ADD - resume Adderall   DVT prophylaxis: SCD's Code Status: Full  Family Communication: Patient and mother at bedside  Disposition Plan: likely home in 1-2 days   Consultants:   Neurology   Procedures:   None  Antimicrobials:   None  Subjective: Patient reports feeling better but with diffuse pain. Asking for IV medications.   Objective: Vitals:   06/10/17 0023 06/10/17 0152 06/10/17 0413 06/10/17 0958  BP:  122/80 111/73 (!) 143/94 105/65  Pulse: 74 63 70 70  Resp: 18  20 19   Temp: 98.7 F (37.1 C)  97.7 F (36.5 C) 97.9 F (36.6 C)  TempSrc: Oral  Axillary Oral  SpO2: 100% 100% 99% 98%  Weight: 94.4 kg (208 lb 1.6 oz)     Height: 5\' 6"  (1.676 m)       Intake/Output Summary (Last 24 hours) at 06/10/17 1204 Last data filed at 06/10/17 0900  Gross per 24 hour  Intake              120 ml  Output                0 ml  Net              120 ml   Filed Weights   06/09/17 1659 06/10/17 0023  Weight: 98 kg (216 lb) 94.4 kg (208 lb 1.6 oz)    Examination:  General exam: Appears calm and comfortable  Respiratory system: Clear to auscultation. Respiratory effort normal. Cardiovascular system: S1 & S2 heard, RRR. No JVD, murmurs, rubs, gallops or clicks. No pedal edema. Gastrointestinal system: Abdomen is nondistended, soft and nontender. No organomegaly or masses felt. Normal bowel sounds heard. Central nervous system: Alert and oriented. Not able to lift LE against gravity, withdraws to nail bed pressure in bilateral upper and lower extremities   Data Reviewed: I have personally reviewed  following labs and imaging studies  CBC:  Recent Labs Lab 06/09/17 1708  WBC 8.5  3.7*  HGB 14.8  9.0*  HCT 43.4  29.6*  MCV 94.8  83.4  PLT 288  270   Basic Metabolic Panel:  Recent Labs Lab 06/09/17 1708  NA 140  K 3.6  CL 106  CO2 25  GLUCOSE 84  BUN 16  CREATININE 0.67  CALCIUM 9.0   Liver Function Tests:  Recent Labs Lab 06/09/17 1708  AST 25  ALT 56*  ALKPHOS 96  BILITOT 0.6  PROT 7.4  ALBUMIN 4.5   CBG:  Recent Labs Lab 06/09/17 1655  GLUCAP 91   Urine analysis:    Component Value Date/Time   COLORURINE YELLOW 12/17/2016 1706   APPEARANCEUR Cloudy (A) 03/30/2017 1600   LABSPEC 1.010 12/17/2016 1706   PHURINE 6.0 12/17/2016 1706   GLUCOSEU Negative 03/30/2017 1600   HGBUR NEGATIVE 12/17/2016 1706   BILIRUBINUR Negative 03/30/2017 1600   KETONESUR  NEGATIVE 12/17/2016 1706   PROTEINUR Negative 03/30/2017 1600   PROTEINUR NEGATIVE 12/17/2016 1706   UROBILINOGEN 4.0 (H) 08/17/2015 1300   NITRITE Positive (A) 03/30/2017 1600   NITRITE NEGATIVE 12/17/2016 1706   LEUKOCYTESUR 3+ (A) 03/30/2017 1600   Radiology Studies: Dg Lumbar Spine Complete  Result Date: 06/09/2017 CLINICAL DATA:  Low back pain and leg numbness. EXAM: LUMBAR SPINE - COMPLETE 4+ VIEW COMPARISON:  None. FINDINGS: There is no evidence of lumbar spine fracture. Alignment is normal. Intervertebral disc spaces are maintained. IMPRESSION: Negative. Electronically Signed   By: Dorise Bullion III M.D   On: 06/09/2017 22:18   Ct Head Wo Contrast  Result Date: 06/09/2017 CLINICAL DATA:  31 year old female with headache and probable seizure. EXAM: CT HEAD WITHOUT CONTRAST TECHNIQUE: Contiguous axial images were obtained from the base of the skull through the vertex without intravenous contrast. COMPARISON:  11/15/2015 CT and 01/23/2016 MR FINDINGS: Brain: No evidence of infarction, hemorrhage, hydrocephalus, extra-axial collection or mass lesion/mass effect. Vascular: No hyperdense vessel or unexpected calcification. Skull: Normal. Negative for fracture or focal lesion. Sinuses/Orbits: No acute finding. Other: None. IMPRESSION: Unremarkable noncontrast head CT. Electronically Signed   By: Margarette Canada M.D.   On: 06/09/2017 22:03    Scheduled Meds: . amphetamine-dextroamphetamine  30 mg Oral Daily  . carbamazepine  400 mg Oral QHS  . DULoxetine  120 mg Oral QHS  . lidocaine  1 patch Transdermal Q24H  . sodium chloride flush  3 mL Intravenous Q12H   Continuous Infusions: . sodium chloride       LOS: 0 days   Time spent: 35 minutes   Faye Ramsay, MD [facility] Pager [phone]  If 7PM-7AM, please contact night-coverage [email or web address] Password [email or web address] 06/10/2017, 12:04 PM

## 2017-06-10 NOTE — Progress Notes (Signed)
MRI called and reported patient having seizure like activity. Upon arrival patient was noted to have both hands in a tight ball like squeeze.   Patient was not responding to questions. Attending was page notified and so was Neurologist. Pt was transferred back to the room. Vitals Bp 110/71, HR 60, O2 100 (2l), T 97.5 Axillary. Pt did wake up and asked "where am I" and went back to sleep. Will continue to monitor.

## 2017-06-10 NOTE — Progress Notes (Signed)
Patient arrived from [facility] to 5M01. Patient alert and oriented x 4. Complaining of back pain rated 10/10 and bilateral leg numbness and weakness.[facility] notified of patient's arrival to floor. MD paged for pain medications. RN will continue to monitor.

## 2017-06-10 NOTE — Progress Notes (Signed)
Patient is requesting IV pain medication to help with pain management. Pt currently has Vicodin , Lidocaine patches (pt refusing) , Toradol (pt is also refusing). MD has been page notified.

## 2017-06-11 ENCOUNTER — Inpatient Hospital Stay (HOSPITAL_COMMUNITY)
Admit: 2017-06-11 | Discharge: 2017-06-11 | Disposition: A | Discharge status: Home / Self Care | Payer: Medicaid Other | Attending: Neurology | Admitting: Neurology

## 2017-06-11 ENCOUNTER — Inpatient Hospital Stay (HOSPITAL_COMMUNITY): Payer: Medicaid Other

## 2017-06-11 LAB — CBC
HEMATOCRIT: 37.3 % (ref 36.0–46.0)
HEMOGLOBIN: 12.1 g/dL (ref 12.0–15.0)
MCH: 31.8 pg (ref 26.0–34.0)
MCHC: 32.4 g/dL (ref 30.0–36.0)
MCV: 98.2 fL (ref 78.0–100.0)
Platelets: 175 10*3/uL (ref 150–400)
RBC: 3.8 MIL/uL — ABNORMAL LOW (ref 3.87–5.11)
RDW: 13.1 % (ref 11.5–15.5)
WBC: 6.3 10*3/uL (ref 4.0–10.5)

## 2017-06-11 LAB — BASIC METABOLIC PANEL
ANION GAP: 7 (ref 5–15)
BUN: 15 mg/dL (ref 6–20)
CALCIUM: 8.5 mg/dL — AB (ref 8.9–10.3)
CO2: 23 mmol/L (ref 22–32)
CREATININE: 0.57 mg/dL (ref 0.44–1.00)
Chloride: 111 mmol/L (ref 101–111)
GFR calc non Af Amer: >60 mL/min (ref >60)
Glucose, Bld: 100 mg/dL — ABNORMAL HIGH (ref 65–99)
Potassium: 3.8 mmol/L (ref 3.5–5.1)
SODIUM: 141 mmol/L (ref 135–145)

## 2017-06-11 LAB — URINALYSIS, ROUTINE W REFLEX MICROSCOPIC
BILIRUBIN URINE: NEGATIVE
Glucose, UA: NEGATIVE mg/dL
Hgb urine dipstick: NEGATIVE
KETONES UR: NEGATIVE mg/dL
LEUKOCYTES UA: NEGATIVE
NITRITE: NEGATIVE
PH: 5 (ref 5.0–8.0)
PROTEIN: NEGATIVE mg/dL
Specific Gravity, Urine: 1.03 (ref 1.005–1.030)

## 2017-06-11 MED ORDER — PROMETHAZINE HCL 25 MG PO TABS: 25.0000 mg | ORAL_TABLET | Freq: Four times a day (QID) | ORAL | 0 refills | Status: DC | PRN

## 2017-06-11 MED ORDER — CLONAZEPAM 0.5 MG PO TABS: 0.5000 mg | ORAL_TABLET | Freq: Three times a day (TID) | ORAL | 0 refills | Status: DC | PRN

## 2017-06-11 MED ORDER — POLYETHYLENE GLYCOL 3350 17 G PO PACK
17.0000 g | PACK | Freq: Every day | ORAL | Status: DC
  Administered 2017-06-11 – 2017-06-13 (×3): 17 g via ORAL
  Filled 2017-06-11 (×4): qty 1

## 2017-06-11 MED ORDER — HYDROCODONE-ACETAMINOPHEN 5-325 MG PO TABS: 1.0000 | ORAL_TABLET | ORAL | 0 refills | Status: DC | PRN

## 2017-06-11 MED ORDER — SENNA 8.6 MG PO TABS
1.0000 | ORAL_TABLET | Freq: Two times a day (BID) | ORAL | Status: DC
  Administered 2017-06-11 – 2017-06-14 (×6): 8.6 mg via ORAL
  Filled 2017-06-11 (×6): qty 1

## 2017-06-11 NOTE — Progress Notes (Signed)
On-call neuro informed of patients events throughout shift. RN will continue to monitor. No addition anti-siezure or pain medications at his time. Day treatment team will discuss treatment plan going forward. Patient updated.

## 2017-06-11 NOTE — Progress Notes (Signed)
Patient had 3 episodes beginning at 0432, each lasting 25-30 seconds. Patient sttod and pivoted with 2 person assist to [facility] to urinate. Post urination patient body became very rigid in a decorticate posturing, head stiffened and turned up and left, eyes rolling to back of head, change in patients RR and choking sound coming from patient. patient verbally non responsive for approximately 2 min. Last episode ending at 21. Patient transferred back to bed, placed on right side with pillow wedge support with no difficulty. Patient verbally responding, stating her body hurts so bad. Patient moving upper extremities with no difficulty. Patient resting, padded bed rails up, bed alarm activated. RN will continue to monitor.

## 2017-06-11 NOTE — Progress Notes (Signed)
LTM up and running, patient knows how to use event button

## 2017-06-11 NOTE — Significant Event (Signed)
Rapid Response Event Note  Overview: Time Called: 7680 Arrival Time: 8811 Event Type: Neurologic  Initial Focused Assessment: Patient with continuous video monitored EEG.  Per Staff she has been having seizure like activity lasting about 30sec every minute for the past 10 min.   Per staff no desaturation, but placed patient on 100% NRB because of "stridor" and grunting.  She received 2mg  Ativan IV x1 Upon my arrival patient with repetitive movement of her head and stiff extremities.   BP 124/73  HR 76  RR 16 O2 sats 100% on NRB. Clear regular breath sounds, Regular heart tones   Interventions: Removed NRB mask,  O2 sats remained 96% on RA Encouraged patient to relax.  Patient able to follow commands during episode. She awoke and started interacting with staff  Per Dr Leonel Ramsay, patient with normal waveforms on EEG.  Family at bedside shortly after the episode, updated on patient status.   Plan of Care (if not transferred): No additional Ativan Continue to maintain the patient's safety while she has these episodes.  Event Summary: Name of Physician Notified: Dr Leonel Ramsay prior to my arrival at      at    Outcome: Stayed in room and stabalized  Event End Time: [place]  Raliegh Ip

## 2017-06-11 NOTE — Progress Notes (Signed)
Patient with inability to void; bladder scan revealed 275 ml; patient in and out cath. Using sterile tech. Obtained 347ml dark amber urine; patient tolerated without distress.

## 2017-06-11 NOTE — Progress Notes (Addendum)
Subjective: Feels her right leg has improved.   Exam: Vitals:   06/11/17 1700 06/11/17 1705  BP: 139/90 119/67  Pulse: (!) 103 76  Resp: 18 16  Temp:     Gen: In bed, NAD Resp: non-labored breathing, no acute distress Abd: soft, nt  Neuro: MS: Awake, alert, interactive and appropriate CN: Pupils equal round and reactive, extra movements intact Motor: Moves the right leg much better than yesterday, left legs continues to not move DTR: 2+ and symmetric at the patella and ankles  Pertinent Labs: Unremarkable BMP  Impression: 31 year old female with previous diagnosis of pseudoseizures who presented with recurrent episodes and lower extremity weakness. 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 With negative MRI, I feel that structural spine disease is unlikely, and with preserved reflexes GBS is not likely either. Even the frequency of events, I do think it may be worthwhile to repeat a overnight EEG to ensure that the proper diagnosis of psychogenic seizure has been made.  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 HNPP, can however, be a painful condition and sometimes is mistaken for fibromyalgia.  Recommendations: 1) overnight EEG  Roland Rack, MD [facility] [phone]  If 7pm- 7am, please page neurology on call as listed in [place].

## 2017-06-11 NOTE — Progress Notes (Signed)
RN received call from patient at 2046 patient stating her back spasms were getting worse and she felt like her siezures were about to come on. NT call RN at 2103 stating patient was having seizure. RN went to bedside. Neurology notified.Patient airway remained open and clear.  Patient oxygen level maintained 97% or greater throughout episode, HR 63 at relaxed state and increased to 103 with muscle rigidity. RN stayed with patient at bedside until symptoms resolved. RN will continue to monitor.

## 2017-06-11 NOTE — Progress Notes (Signed)
Patient ID: Penny [name], female   DOB: 11-04-1986, 31 y.o.   MRN: [id number]    PROGRESS NOTE  Penny [name]  GBT:[id number] DOB: 21-Nov-1986 DOA: 06/09/2017  PCP: Kathyrn Drown, MD   Brief Narrative:  31 yo female with chronic pain in neck, arms and legs, following with pain clinic, pseudoseizures, ADD, presented to [facility] ED after an episodes of seizure like activity. Patient says she has ran out of pain medications three days prior to this admission, she was not able to get an appointment soon enough for refill. After the event, pt remained weak and concerned with loss of sensation and weakness in bilateral lower extremities.  Assessment & Plan:   Principal Problem:   Seizure-like activity ([place]) - appears to be pseudoseizures as pt is rather alert after the event with no clear post ictal phase - plan for 24 hour EEG monitoring to see if we can capture the event   Active Problems:   Bilateral LE weakness and loss of sensation - unclear etiology, pt is able to bend her knees and withdraws to noxious stimuli  - MRI lumbar and thoracic spine with no etiology that could explain pt's symptoms  - PT/OT eval requested and still pending     History of migraine headaches - requested IV pain medications this AM - Topamax on hold until seizure etiology resolved     ADD - resume Adderall     Narcotic induced constipation - placed on bowel regimen   DVT prophylaxis: SCD's Code Status: Full  Family Communication: Patient and mother at bedside  Disposition Plan: likely home in 1-2 days   Consultants:   Neurology   Procedures:   None  Antimicrobials:   None  Subjective: Pt reports feeling tired, still with no sensation in lower extremities and with persistent weakness.   Objective: Vitals:   06/10/17 1820 06/10/17 2153 06/11/17 0448 06/11/17 0857  BP: 105/71 119/61 116/72 115/65  Pulse: 70 (!) 58 78 78  Resp: 19 20 18 20   Temp: 97.8 F (36.6 C) 98.1 F (36.7 C) 97.7 F  (36.5 C) 98.2 F (36.8 C)  TempSrc: Oral Oral Axillary Oral  SpO2: 100% 95% 97% 95%  Weight:      Height:        Intake/Output Summary (Last 24 hours) at 06/11/17 1319 Last data filed at 06/11/17 1303  Gross per 24 hour  Intake                3 ml  Output             1015 ml  Net            -1012 ml   Filed Weights   06/09/17 1659 06/10/17 0023  Weight: 98 kg (216 lb) 94.4 kg (208 lb 1.6 oz)   Physical Exam  Constitutional: Appears well-developed and well-nourished. No distress.  CVS: RRR, S1/S2 +, no murmurs, no gallops, no carotid bruit.  Pulmonary: Effort and breath sounds normal, no stridor, rhonchi, wheezes, rales.  Abdominal: Soft. BS +,  no distension, tenderness, rebound or guarding.  Musculoskeletal: Normal range of motion. No edema and no tenderness.  Lymphadenopathy: No lymphadenopathy noted, cervical, inguinal. Neuro: Alert. Withdraws to nail bed pressure in upper and lower extremities bilaterally, not moving her lwoer extremities.    Data Reviewed: I have personally reviewed following labs and imaging studies  CBC:  Recent Labs Lab 06/09/17 1708 06/11/17 0400  WBC 8.5  3.7* 6.3  HGB  14.8  9.0* 12.1  HCT 43.4  29.6* 37.3  MCV 94.8  83.4 98.2  PLT 288  153 253   Basic Metabolic Panel:  Recent Labs Lab 06/09/17 1708 06/11/17 0400  NA 140 141  K 3.6 3.8  CL 106 111  CO2 25 23  GLUCOSE 84 100*  BUN 16 15  CREATININE 0.67 0.57  CALCIUM 9.0 8.5*   Liver Function Tests:  Recent Labs Lab 06/09/17 1708  AST 25  ALT 56*  ALKPHOS 96  BILITOT 0.6  PROT 7.4  ALBUMIN 4.5   CBG:  Recent Labs Lab 06/09/17 1655  GLUCAP 91   Urine analysis:    Component Value Date/Time   COLORURINE YELLOW 12/17/2016 1706   APPEARANCEUR Cloudy (A) 03/30/2017 1600   LABSPEC 1.010 12/17/2016 1706   PHURINE 6.0 12/17/2016 1706   GLUCOSEU Negative 03/30/2017 1600   HGBUR NEGATIVE 12/17/2016 1706   BILIRUBINUR Negative 03/30/2017 1600   KETONESUR  NEGATIVE 12/17/2016 1706   PROTEINUR Negative 03/30/2017 1600   PROTEINUR NEGATIVE 12/17/2016 1706   UROBILINOGEN 4.0 (H) 08/17/2015 1300   NITRITE Positive (A) 03/30/2017 1600   NITRITE NEGATIVE 12/17/2016 1706   LEUKOCYTESUR 3+ (A) 03/30/2017 1600   Radiology Studies: Dg Lumbar Spine Complete  Result Date: 06/09/2017 CLINICAL DATA:  Low back pain and leg numbness. EXAM: LUMBAR SPINE - COMPLETE 4+ VIEW COMPARISON:  None. FINDINGS: There is no evidence of lumbar spine fracture. Alignment is normal. Intervertebral disc spaces are maintained. IMPRESSION: Negative. Electronically Signed   By: Dorise Bullion III M.D   On: 06/09/2017 22:18   Ct Head Wo Contrast  Result Date: 06/09/2017 CLINICAL DATA:  31 year old female with headache and probable seizure. EXAM: CT HEAD WITHOUT CONTRAST TECHNIQUE: Contiguous axial images were obtained from the base of the skull through the vertex without intravenous contrast. COMPARISON:  11/15/2015 CT and 01/23/2016 MR FINDINGS: Brain: No evidence of infarction, hemorrhage, hydrocephalus, extra-axial collection or mass lesion/mass effect. Vascular: No hyperdense vessel or unexpected calcification. Skull: Normal. Negative for fracture or focal lesion. Sinuses/Orbits: No acute finding. Other: None. IMPRESSION: Unremarkable noncontrast head CT. Electronically Signed   By: Margarette Canada M.D.   On: 06/09/2017 22:03   Mr Thoracic Spine Wo Contrast  Result Date: 06/11/2017 CLINICAL DATA:  Initial evaluation for bilateral lower extremity weakness with loss of sensation. Seizures. EXAM: MRI THORACIC AND LUMBAR SPINE WITHOUT CONTRAST TECHNIQUE: Multiplanar and multiecho pulse sequences of the thoracic and lumbar spine were obtained without intravenous contrast. COMPARISON:  Prior MRI from 01/23/2016. FINDINGS: MRI THORACIC SPINE FINDINGS Alignment: Vertebral bodies normally aligned with preservation of the normal thoracic kyphosis. No listhesis. Vertebrae: Mild chronic compression  deformity involving the superior endplate of T8 noted, stable from previous. Vertebral body heights otherwise maintained. No other evidence for acute or interval fracture. Benign hemangioma noted within the T6 vertebral body. Signal intensity within the vertebral body bone marrow otherwise within normal limits. No other discrete or worrisome osseous lesions. No abnormal marrow edema. Cord: Signal intensity within the thoracic spinal cord is normal. Cord caliber within normal limits. Conus medullaris terminates below the T12 level. Paraspinal and other soft tissues: Paraspinous soft tissues within normal limits. Partially visualized lungs are clear. Visualized visceral structures within normal limits. Disc levels: Mild degenerative disc bulge noted at C5-6 and C6-7 on counter sequence. No significant degenerative disc pathology seen within the thoracic spine. No disc bulge or disc protrusion. No significant facet disease. No canal or foraminal stenosis. MRI LUMBAR SPINE FINDINGS Segmentation:  Normal segmentation. Lowest well-formed disc labeled the L5-S1 level. Alignment: Vertebral bodies normally aligned with preservation of the normal lumbar lordosis. No listhesis. Vertebrae: Vertebral body heights are maintained. No evidence for acute or chronic fracture. Few small benign hemangiomas noted. Signal intensity within the vertebral body bone marrow within normal limits. No worrisome osseous lesions. Conus medullaris: Extends to the L1 level and appears normal. Paraspinal and other soft tissues: Paraspinous soft tissues within normal limits. Left adnexal cystic lesion likely reflects a physiologic ovarian cyst. Visualized visceral structures otherwise unremarkable. Disc levels: L1-2:  Unremarkable. L2-3:  Unremarkable. L3-4: Mild diffuse disc bulge with disc desiccation. Superimposed small right subarticular disc extrusion with caudad angulation. Associated annular fissure. No associated stenosis or neural impingement.  Foramina are widely patent. L4-5: Mild disc bulge with disc desiccation. Shallow right subarticular disc protrusion with associated caudad angulation. Associated annular fissure. Protruding disc contacts the descending right L5 nerve root in the right lateral recess without significant displacement. Resultant mild to moderate right lateral recess stenosis. Central canal remains widely patent. No foraminal narrowing. L5-S1:  Unremarkable. IMPRESSION: MR THORACIC SPINE IMPRESSION 1. Mild chronic T8 compression deformity, stable from previous. 2. Otherwise unremarkable MRI of the thoracic spine. MR LUMBAR SPINE IMPRESSION 1. Small disc extrusion at L3-4 with additional small disc protrusion at L4-5 as above, unchanged from previous. 2. Otherwise unremarkable MRI of the lumbar spine. Electronically Signed   By: Jeannine Boga M.D.   On: 06/11/2017 03:19   Mr Lumbar Spine Wo Contrast  Result Date: 06/11/2017 CLINICAL DATA:  Initial evaluation for bilateral lower extremity weakness with loss of sensation. Seizures. EXAM: MRI THORACIC AND LUMBAR SPINE WITHOUT CONTRAST TECHNIQUE: Multiplanar and multiecho pulse sequences of the thoracic and lumbar spine were obtained without intravenous contrast. COMPARISON:  Prior MRI from 01/23/2016. FINDINGS: MRI THORACIC SPINE FINDINGS Alignment: Vertebral bodies normally aligned with preservation of the normal thoracic kyphosis. No listhesis. Vertebrae: Mild chronic compression deformity involving the superior endplate of T8 noted, stable from previous. Vertebral body heights otherwise maintained. No other evidence for acute or interval fracture. Benign hemangioma noted within the T6 vertebral body. Signal intensity within the vertebral body bone marrow otherwise within normal limits. No other discrete or worrisome osseous lesions. No abnormal marrow edema. Cord: Signal intensity within the thoracic spinal cord is normal. Cord caliber within normal limits. Conus medullaris  terminates below the T12 level. Paraspinal and other soft tissues: Paraspinous soft tissues within normal limits. Partially visualized lungs are clear. Visualized visceral structures within normal limits. Disc levels: Mild degenerative disc bulge noted at C5-6 and C6-7 on counter sequence. No significant degenerative disc pathology seen within the thoracic spine. No disc bulge or disc protrusion. No significant facet disease. No canal or foraminal stenosis. MRI LUMBAR SPINE FINDINGS Segmentation: Normal segmentation. Lowest well-formed disc labeled the L5-S1 level. Alignment: Vertebral bodies normally aligned with preservation of the normal lumbar lordosis. No listhesis. Vertebrae: Vertebral body heights are maintained. No evidence for acute or chronic fracture. Few small benign hemangiomas noted. Signal intensity within the vertebral body bone marrow within normal limits. No worrisome osseous lesions. Conus medullaris: Extends to the L1 level and appears normal. Paraspinal and other soft tissues: Paraspinous soft tissues within normal limits. Left adnexal cystic lesion likely reflects a physiologic ovarian cyst. Visualized visceral structures otherwise unremarkable. Disc levels: L1-2:  Unremarkable. L2-3:  Unremarkable. L3-4: Mild diffuse disc bulge with disc desiccation. Superimposed small right subarticular disc extrusion with caudad angulation. Associated annular fissure. No associated stenosis or  neural impingement. Foramina are widely patent. L4-5: Mild disc bulge with disc desiccation. Shallow right subarticular disc protrusion with associated caudad angulation. Associated annular fissure. Protruding disc contacts the descending right L5 nerve root in the right lateral recess without significant displacement. Resultant mild to moderate right lateral recess stenosis. Central canal remains widely patent. No foraminal narrowing. L5-S1:  Unremarkable. IMPRESSION: MR THORACIC SPINE IMPRESSION 1. Mild chronic T8  compression deformity, stable from previous. 2. Otherwise unremarkable MRI of the thoracic spine. MR LUMBAR SPINE IMPRESSION 1. Small disc extrusion at L3-4 with additional small disc protrusion at L4-5 as above, unchanged from previous. 2. Otherwise unremarkable MRI of the lumbar spine. Electronically Signed   By: Jeannine Boga M.D.   On: 06/11/2017 03:19    Scheduled Meds: . amphetamine-dextroamphetamine  30 mg Oral Daily  . carbamazepine  400 mg Oral QHS  . DULoxetine  120 mg Oral QHS  . lidocaine  1 patch Transdermal Q24H  . sodium chloride flush  3 mL Intravenous Q12H   Continuous Infusions: . sodium chloride       LOS: 1 day   Time spent: 25 minutes   Faye Ramsay, MD [facility] Pager [phone]  If 7PM-7AM, please contact night-coverage [email or web address] Password [email or web address] 06/11/2017, 1:19 PM

## 2017-06-11 NOTE — Progress Notes (Signed)
Patient had 8 30sec episodes beginning at approximately 1222 . Assigned RN called to patients room at 1224. Transport had arrived to take patient for MRI and found patient very rigid, head turned up and to left, fists clenched. Patient HR increased to 122 and patient was incontinent of urine. Patient received 2 mg versed IV. RN transporting patient for MRI and will continue to monitor patient.

## 2017-06-11 NOTE — Progress Notes (Signed)
Writer called to room at 1640 to administer pain medication. Upon entry, [place] witnessed seizure-like activity: patient's head was moving from side to side & patient's arms locked and hands turned inward. Patient at this time on EEG monitor. MD & rapid response paged. Vital signs taken & O2 administered. 2 mg Ativan administered. Witnessed 13-14 20-30 second episodes with 10-30 seconds intermittent between each episode. At 1705, patient awoke. After a couple minutes, began to respond to voice commands. Will continue to monitor.

## 2017-06-11 NOTE — Progress Notes (Signed)
PT Cancellation Note  Patient Details Name: Penny [name] MRN: [id number] DOB: [birth year]   Cancelled Treatment:    Reason Eval/Treat Not Completed: Patient at procedure or test/unavailable. Pt in process of having a continuous EEG placed. PT will continue to f/u with pt as available.    Clearnce Sorrel Adiyah Lame 06/11/2017, 2:31 PM

## 2017-06-11 NOTE — Progress Notes (Signed)
Patient requesting pain medication due to muscle tightness following seizure-like activity, muscle rigidity. MD paged.

## 2017-06-11 NOTE — Progress Notes (Signed)
OT Cancellation Note  Patient Details Name: Penny [name] MRN: [id number] DOB: [birth year]   Cancelled Treatment:    Reason Eval/Treat Not Completed: Patient at procedure or test/ unavailable (EEG)  Redmond Baseman, MS, OTR/L 06/11/2017, 2:32 PM

## 2017-06-12 DIAGNOSIS — F17210 Nicotine dependence, cigarettes, uncomplicated: Secondary | ICD-10-CM

## 2017-06-12 DIAGNOSIS — Z818 Family history of other mental and behavioral disorders: Secondary | ICD-10-CM

## 2017-06-12 DIAGNOSIS — Z560 Unemployment, unspecified: Secondary | ICD-10-CM

## 2017-06-12 LAB — URINE CULTURE: CULTURE: NO GROWTH

## 2017-06-12 MED ORDER — QUETIAPINE FUMARATE 50 MG PO TABS
50.0000 mg | ORAL_TABLET | Freq: Every day | ORAL | Status: DC
  Administered 2017-06-13 (×2): 50 mg via ORAL
  Filled 2017-06-12 (×2): qty 1

## 2017-06-12 MED ORDER — BISACODYL 10 MG RE SUPP
10.0000 mg | Freq: Once | RECTAL | Status: AC
  Administered 2017-06-12: 10 mg via RECTAL
  Filled 2017-06-12: qty 1

## 2017-06-12 NOTE — Procedures (Signed)
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    EEG Classification Normal, Awake and sleep and oriented  PDR 9 Hz, reactive and symmetric  HR 80 bpm and regular   Non-epileptiform abnormalities: none  Interictal epileptiform abnormalities: none   Ictal phenomena: Event type #1: Clinical:  Generalized motor event (LOA) EEG:   No EEG correlate Count:   4   EEG DETAILS:  TYPE OF RECORDING: At least 18-channel digital EEG with using a standard international 10-20 placement with additional EEG electrodes, and 1 additional channel dedicated to the EKG, at a sampling rate of at least 256 Hz. Video was recorded throughout the study. The recording was interpreted using digital review software allowing for montage reformatting, gain and filter changes. Each page was reviewed manually. Automatic spike and seizure detection software and quantitative analysis tools were used as needed.   Description of EEG features: During wakefulness normal voltage, mixed frequency rhythms are seen with a dominant alpha-theta background and superimposed beta activity which has normal amplitude. A posterior dominant rhythm of 9 Hz is observed, which is symmetric and attenuates normally with eye opening.   Drowsiness is accompanied by the usual EEG correlates including roving and then absent eye movements, progressively fewer eye blinks, waxing and waning PDR and disappearance of the PDR, generalized slow activity, and central theta activity.  Normal stage II and slow wave sleep are recorded. Stage II is characterized by normal appearing, symmetric vertex waves, spindles and K-complexes. Slow wave  sleep is distinguishable by high amplitude, diffuse, symmetric delta dominant background with mixed overriding faster activities.   Push Button Events: 7/7 @16 :35 Clincal: Patient becomes stiff, arms outstretched at the sides, head turns to the left and she is unresponsive. Wrists are in a dystonic-like posture. She relaxes several times, breathes loudly, then tenses up again. When staff begins to examine her, she starts having relatively low amplitude, semirhythmic movements that then stop after ~1 minute. She remains unresponsive and intermittently tenses up until 16:46 when she starts lifting her arms and have rhythmic shaking movements. This pattern continues on until ~17:00 when the patient slowly becomes responsive. The event lasts 14 minutes  EEG: there is no EEG ictal pattern; EEG is intermittently obscured by artifact but even during the tense phases a normal posterior dominant rhythm is visible. In between her tensing up, there is clear normal awake EEG. There is no postictal slowing.   7/7 @17 :19 - similar event lasting ~10 minutes  7/7 @21 :09 - similar event lasting ~7 minutes  7/7 @02 :17  Clinical: the patient complains that she is in pain; she then starts having semirhythmic waxing and waning tremor-like movements in her legs. She turns her head from side to side slowly. After ~2 minutes she begins hyperventilating. She tenses her jaw and lifts her arms up in the air, flexed at the elbows, while the leg shaking becomes more intense and head is forcefully turned to the left. This lasts for ~1 minute, then she relaxes her arms, takes a few deep breaths and turns the right and she continues to have variable tremor like movements until 02:33 when the event stops. This lasts 16  minutes.   Interpretation: This continuous EEG is normal. No epileptiform discharges, focal or lateralizing signs are seen. There Is no evidence of encephalopathy.  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

## 2017-06-12 NOTE — Progress Notes (Signed)
LTM taken down per Dr. Leonel Ramsay. No skin breakdown seen

## 2017-06-12 NOTE — Evaluation (Signed)
Occupational Therapy Evaluation Patient Details Name: Penny [name] MRN: [id number] DOB: 1986/10/18 Today's Date: 06/12/2017    History of Present Illness HPI: Penny [name] is a 31 y.o. 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    Clinical Impression   This 31 y/o F presents with the above. At baseline Pt is independent with ADLs and functional mobility. Pt currently requires modA with functional mobility using RW, MaxA for LB ADLs. Shortened eval due to Pt experiencing seizure like symptoms shortly after completing room level functional mobility and seated grooming ADLs. Pt will benefit from continued acute OT services and additional post acute OT services to maximize Pt's safety and independence with ADLs and functional mobility upon return home. Will follow.     Follow Up Recommendations  Outpatient OT;Supervision/Assistance - 24 hour    Equipment Recommendations  None recommended by OT           Precautions / Restrictions Precautions Precautions: Fall Precaution Comments: Seizures Restrictions Weight Bearing Restrictions: No      Mobility Bed Mobility Overal bed mobility: Needs Assistance Bed Mobility: Supine to Sit     Supine to sit: Min guard     General bed mobility comments: [place] for safety  Transfers Overall transfer level: Needs assistance Equipment used: Rolling walker (2 wheeled) Transfers: Sit to/from Stand Sit to Stand: Mod assist         General transfer comment: Mod assis tto power up; cues for hand placement and safety    Balance Overall balance assessment: Needs assistance Sitting-balance support: Feet supported Sitting balance-Leahy Scale: Fair     Standing balance support: Bilateral upper extremity supported Standing balance-Leahy Scale: Poor Standing balance comment: Dependent on UE support                            ADL either performed or assessed with clinical judgement   ADL Overall ADL's : Needs assistance/impaired Eating/Feeding: Set up;Sitting   Grooming: Set up;Sitting;Brushing hair;Oral care   Upper Body Bathing: Min guard;Sitting   Lower Body Bathing: Minimal assistance;Sit to/from stand   Upper Body Dressing : Min guard;Sitting   Lower Body Dressing: Sit to/from stand;Maximal assistance   Toilet Transfer: Minimal assistance;Ambulation;RW;Comfort height toilet Toilet Transfer Details (indicate cue type and reason): simulated in bed to chair transfer  [place] and Hygiene: Minimal assistance;Sit to/from stand       Functional mobility during ADLs: Minimal assistance;Rolling walker General ADL Comments: Pt completed bed mobility, brief room level functional mobility, reporting feeling weak in LEs, sat EOB to complete grooming ADLs with setup; Pt's father and stepmother arriving during session; Pt with seizure like symptoms soon after completion of grooming ADLs while sitting EOB, RN notified and assisted Pt back to supine in bed with +2 assist                          Pertinent Vitals/Pain Pain Assessment: Faces Faces Pain Scale: Hurts a little bit Pain Location: Back pain, chronic Pain Descriptors / Indicators: Grimacing Pain Intervention(s): Limited activity within patient's tolerance;Monitored during session     Hand Dominance     Extremity/Trunk Assessment Upper Extremity Assessment Upper Extremity Assessment: Generalized weakness   Lower Extremity Assessment Lower Extremity Assessment: Defer to PT evaluation       Communication Communication Communication: No difficulties  Cognition Arousal/Alertness: Awake/alert Behavior During Therapy: WFL for tasks assessed/performed Overall Cognitive Status: Within Functional Limits for tasks assessed                                     General Comments                   Home Living Family/patient expects to be discharged to:: Private residence Living Arrangements: Parent;Children Available Help at Discharge: Family;Available 24 hours/day Type of Home: House Home Access: Stairs to enter [employer] of Steps: 2 Entrance Stairs-Rails: Right;Left Home Layout: Two level;Able to live on main level with bedroom/bathroom Alternate Level Stairs-Number of Steps: flight   Bathroom Shower/Tub: Teacher, early years/pre: Standard     Home Equipment: Environmental consultant - 4 wheels;Wheelchair - manual;Tub bench          Prior Functioning/Environment Level of Independence: Independent                 OT Problem List: Decreased strength;Decreased activity tolerance;Impaired balance (sitting and/or standing)      OT Treatment/Interventions: Self-care/ADL training;DME and/or AE instruction;Therapeutic activities;Balance training;Therapeutic exercise;Patient/family education;Energy conservation    OT Goals(Current goals can be found in the care plan section) Acute Rehab OT Goals Patient Stated Goal: get better and get home OT Goal Formulation: With patient Time For Goal Achievement: 06/26/17 Potential to Achieve Goals: Good  OT Frequency: Min 2X/week                             AM-PAC PT "6 Clicks" Daily Activity     Outcome Measure Help from another person eating meals?: None Help from another person taking care of personal grooming?: A Little Help from another person toileting, which includes using toliet, bedpan, or urinal?: A Little Help from another person bathing (including washing, rinsing, drying)?: A Lot Help from another person to put on and taking off regular upper body clothing?: A Little Help from another person to put on and taking off regular lower body clothing?: A Lot 6 Click Score: 17   End of Session Equipment Utilized During Treatment: Gait belt;Rolling walker Nurse Communication: Mobility  status  Activity Tolerance: Patient tolerated treatment well;Other (comment) (session ended due to Pt having seizure like symptoms ) Patient left: in bed;with call bell/phone within reach;with nursing/sitter in room;with family/visitor present  OT Visit Diagnosis: Unsteadiness on feet (R26.81);Muscle weakness (generalized) (M62.81)                Time: 0175-1025 OT Time Calculation (min): 24 min Charges:  OT General Charges $OT Visit: 1 Procedure OT Evaluation $OT Eval Moderate Complexity: 1 Procedure G-Codes:     Lou Cal, OT Pager [phone] 06/12/2017   Raymondo Band 06/12/2017, 4:23 PM

## 2017-06-12 NOTE — Progress Notes (Signed)
Patient c/o abdominal cramping and discomfort due to LBM 06/08/17. Patient requesting suppository or enema. Ducolax suppository 10 mg per Dr Hal Hope.

## 2017-06-12 NOTE — Progress Notes (Addendum)
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   Exam: Vitals:   06/12/17 0800 06/12/17 0910  BP: (S) 113/83 116/78  Pulse: 78 78  Resp:  18  Temp:  98.4 F (36.9 C)   Gen: In bed, NAD Resp: non-labored breathing, no acute distress Abd: soft, nt  Neuro: MS: Awake, alert, interactive and appropriate CN: Pupils equal round and reactive, extra movements intact Motor: able to lift both legs off the bed.  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 With negative MRI, I feel that structural spine disease is unlikely, and with preserved reflexes GBS is not likely either. Overnight EEG showed psychogenic seizures.   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 HNPP, can however, be a painful condition and sometimes is mistaken for fibromyalgia.  Recommendations: 1) Psychotherapy 2) No further recommendations at this time. Please call with further questions or concerns.   Roland Rack, MD [facility] [phone]  If 7pm- 7am, please page neurology on call as listed in [place].

## 2017-06-12 NOTE — Progress Notes (Signed)
Patient ID: Penny [name], female   DOB: 06-17-1986, 31 y.o.   MRN: [id number]    PROGRESS NOTE  JESSCIA IMM  LJQ:[id number] DOB: Oct 04, 1986 DOA: 06/09/2017  PCP: Kathyrn Drown, MD   Brief Narrative:  31 yo female with chronic pain in neck, arms and legs, following with pain clinic, pseudoseizures, ADD, presented to [facility] ED after an episodes of seizure like activity. Patient says she has ran out of pain medications three days prior to this admission, she was not able to get an appointment soon enough for refill. After the event, pt remained weak and concerned with loss of sensation and weakness in bilateral lower extremities.  Assessment & Plan:   Principal Problem:   Seizure-like activity ([place]) - appears to be pseudoseizures as pt is rather alert after the event with no clear post ictal phase - no evidence of seizures on EEG - psych consulted and added Seroquel, no need for inpatient psych   Active Problems:   Bilateral LE weakness and loss of sensation - unclear etiology, pt is able to bend her knees and withdraws to noxious stimuli  - MRI lumbar and thoracic spine with no etiology that could explain pt's symptoms  - outpatient PT/OT recommended     History of migraine headaches - requested IV pain medications this AM - Topamax on hold until seizure etiology resolved     ADD - resume Adderall     Narcotic induced constipation - continue bowel regimen   DVT prophylaxis: SCD's Code Status: Full  Family Communication: pt and family at bedside  Disposition Plan: likely home in AM  Consultants:   Neurology   Procedures:   None  Antimicrobials:   None  Subjective: Pt sleeping this AM but easy to awake.   Objective: Vitals:   06/12/17 0800 06/12/17 0910 06/12/17 1353 06/12/17 1521  BP: (S) 113/83 116/78 125/80   Pulse: 78 78 77   Resp:  18 20   Temp:  98.4 F (36.9 C)  97.8 F (36.6 C)  TempSrc:  Oral  Oral  SpO2: 93% 93% 95%   Weight:      Height:          Intake/Output Summary (Last 24 hours) at 06/12/17 1634 Last data filed at 06/12/17 0400  Gross per 24 hour  Intake                3 ml  Output             1200 ml  Net            -1197 ml   Filed Weights   06/09/17 1659 06/10/17 0023  Weight: 98 kg (216 lb) 94.4 kg (208 lb 1.6 oz)   Physical Exam  Constitutional: Appears well-developed and well-nourished. No distress.  CVS: RRR, S1/S2 +, no murmurs, no gallops, no carotid bruit.  Pulmonary: Effort and breath sounds normal, no stridor, rhonchi, wheezes, rales.  Abdominal: Soft. BS +,  no distension, tenderness, rebound or guarding.  Musculoskeletal: No edema and no tenderness. Moving lower extremities spont   Data Reviewed: I have personally reviewed following labs and imaging studies  CBC:  Recent Labs Lab 06/09/17 1708 06/11/17 0400  WBC 8.5  3.7* 6.3  HGB 14.8  9.0* 12.1  HCT 43.4  29.6* 37.3  MCV 94.8  83.4 98.2  PLT 288  153 219   Basic Metabolic Panel:  Recent Labs Lab 06/09/17 1708 06/11/17 0400  NA 140 141  K  3.6 3.8  CL 106 111  CO2 25 23  GLUCOSE 84 100*  BUN 16 15  CREATININE 0.67 0.57  CALCIUM 9.0 8.5*   Liver Function Tests:  Recent Labs Lab 06/09/17 1708  AST 25  ALT 56*  ALKPHOS 96  BILITOT 0.6  PROT 7.4  ALBUMIN 4.5   CBG:  Recent Labs Lab 06/09/17 1655  GLUCAP 91   Urine analysis:    Component Value Date/Time   COLORURINE YELLOW 06/11/2017 1538   APPEARANCEUR HAZY (A) 06/11/2017 1538   APPEARANCEUR Cloudy (A) 03/30/2017 1600   LABSPEC 1.030 06/11/2017 1538   PHURINE 5.0 06/11/2017 1538   GLUCOSEU NEGATIVE 06/11/2017 1538   HGBUR NEGATIVE 06/11/2017 1538   BILIRUBINUR NEGATIVE 06/11/2017 1538   BILIRUBINUR Negative 03/30/2017 1600   KETONESUR NEGATIVE 06/11/2017 1538   PROTEINUR NEGATIVE 06/11/2017 1538   UROBILINOGEN 4.0 (H) 08/17/2015 1300   NITRITE NEGATIVE 06/11/2017 1538   LEUKOCYTESUR NEGATIVE 06/11/2017 1538   LEUKOCYTESUR 3+ (A) 03/30/2017 1600    Radiology Studies: Mr Thoracic Spine Wo Contrast  Result Date: 06/11/2017 CLINICAL DATA:  Initial evaluation for bilateral lower extremity weakness with loss of sensation. Seizures. EXAM: MRI THORACIC AND LUMBAR SPINE WITHOUT CONTRAST TECHNIQUE: Multiplanar and multiecho pulse sequences of the thoracic and lumbar spine were obtained without intravenous contrast. COMPARISON:  Prior MRI from 01/23/2016. FINDINGS: MRI THORACIC SPINE FINDINGS Alignment: Vertebral bodies normally aligned with preservation of the normal thoracic kyphosis. No listhesis. Vertebrae: Mild chronic compression deformity involving the superior endplate of T8 noted, stable from previous. Vertebral body heights otherwise maintained. No other evidence for acute or interval fracture. Benign hemangioma noted within the T6 vertebral body. Signal intensity within the vertebral body bone marrow otherwise within normal limits. No other discrete or worrisome osseous lesions. No abnormal marrow edema. Cord: Signal intensity within the thoracic spinal cord is normal. Cord caliber within normal limits. Conus medullaris terminates below the T12 level. Paraspinal and other soft tissues: Paraspinous soft tissues within normal limits. Partially visualized lungs are clear. Visualized visceral structures within normal limits. Disc levels: Mild degenerative disc bulge noted at C5-6 and C6-7 on counter sequence. No significant degenerative disc pathology seen within the thoracic spine. No disc bulge or disc protrusion. No significant facet disease. No canal or foraminal stenosis. MRI LUMBAR SPINE FINDINGS Segmentation: Normal segmentation. Lowest well-formed disc labeled the L5-S1 level. Alignment: Vertebral bodies normally aligned with preservation of the normal lumbar lordosis. No listhesis. Vertebrae: Vertebral body heights are maintained. No evidence for acute or chronic fracture. Few small benign hemangiomas noted. Signal intensity within the vertebral  body bone marrow within normal limits. No worrisome osseous lesions. Conus medullaris: Extends to the L1 level and appears normal. Paraspinal and other soft tissues: Paraspinous soft tissues within normal limits. Left adnexal cystic lesion likely reflects a physiologic ovarian cyst. Visualized visceral structures otherwise unremarkable. Disc levels: L1-2:  Unremarkable. L2-3:  Unremarkable. L3-4: Mild diffuse disc bulge with disc desiccation. Superimposed small right subarticular disc extrusion with caudad angulation. Associated annular fissure. No associated stenosis or neural impingement. Foramina are widely patent. L4-5: Mild disc bulge with disc desiccation. Shallow right subarticular disc protrusion with associated caudad angulation. Associated annular fissure. Protruding disc contacts the descending right L5 nerve root in the right lateral recess without significant displacement. Resultant mild to moderate right lateral recess stenosis. Central canal remains widely patent. No foraminal narrowing. L5-S1:  Unremarkable. IMPRESSION: MR THORACIC SPINE IMPRESSION 1. Mild chronic T8 compression deformity, stable from previous. 2. Otherwise unremarkable  MRI of the thoracic spine. MR LUMBAR SPINE IMPRESSION 1. Small disc extrusion at L3-4 with additional small disc protrusion at L4-5 as above, unchanged from previous. 2. Otherwise unremarkable MRI of the lumbar spine. Electronically Signed   By: Jeannine Boga M.D.   On: 06/11/2017 03:19   Mr Lumbar Spine Wo Contrast  Result Date: 06/11/2017 CLINICAL DATA:  Initial evaluation for bilateral lower extremity weakness with loss of sensation. Seizures. EXAM: MRI THORACIC AND LUMBAR SPINE WITHOUT CONTRAST TECHNIQUE: Multiplanar and multiecho pulse sequences of the thoracic and lumbar spine were obtained without intravenous contrast. COMPARISON:  Prior MRI from 01/23/2016. FINDINGS: MRI THORACIC SPINE FINDINGS Alignment: Vertebral bodies normally aligned with  preservation of the normal thoracic kyphosis. No listhesis. Vertebrae: Mild chronic compression deformity involving the superior endplate of T8 noted, stable from previous. Vertebral body heights otherwise maintained. No other evidence for acute or interval fracture. Benign hemangioma noted within the T6 vertebral body. Signal intensity within the vertebral body bone marrow otherwise within normal limits. No other discrete or worrisome osseous lesions. No abnormal marrow edema. Cord: Signal intensity within the thoracic spinal cord is normal. Cord caliber within normal limits. Conus medullaris terminates below the T12 level. Paraspinal and other soft tissues: Paraspinous soft tissues within normal limits. Partially visualized lungs are clear. Visualized visceral structures within normal limits. Disc levels: Mild degenerative disc bulge noted at C5-6 and C6-7 on counter sequence. No significant degenerative disc pathology seen within the thoracic spine. No disc bulge or disc protrusion. No significant facet disease. No canal or foraminal stenosis. MRI LUMBAR SPINE FINDINGS Segmentation: Normal segmentation. Lowest well-formed disc labeled the L5-S1 level. Alignment: Vertebral bodies normally aligned with preservation of the normal lumbar lordosis. No listhesis. Vertebrae: Vertebral body heights are maintained. No evidence for acute or chronic fracture. Few small benign hemangiomas noted. Signal intensity within the vertebral body bone marrow within normal limits. No worrisome osseous lesions. Conus medullaris: Extends to the L1 level and appears normal. Paraspinal and other soft tissues: Paraspinous soft tissues within normal limits. Left adnexal cystic lesion likely reflects a physiologic ovarian cyst. Visualized visceral structures otherwise unremarkable. Disc levels: L1-2:  Unremarkable. L2-3:  Unremarkable. L3-4: Mild diffuse disc bulge with disc desiccation. Superimposed small right subarticular disc extrusion  with caudad angulation. Associated annular fissure. No associated stenosis or neural impingement. Foramina are widely patent. L4-5: Mild disc bulge with disc desiccation. Shallow right subarticular disc protrusion with associated caudad angulation. Associated annular fissure. Protruding disc contacts the descending right L5 nerve root in the right lateral recess without significant displacement. Resultant mild to moderate right lateral recess stenosis. Central canal remains widely patent. No foraminal narrowing. L5-S1:  Unremarkable. IMPRESSION: MR THORACIC SPINE IMPRESSION 1. Mild chronic T8 compression deformity, stable from previous. 2. Otherwise unremarkable MRI of the thoracic spine. MR LUMBAR SPINE IMPRESSION 1. Small disc extrusion at L3-4 with additional small disc protrusion at L4-5 as above, unchanged from previous. 2. Otherwise unremarkable MRI of the lumbar spine. Electronically Signed   By: Jeannine Boga M.D.   On: 06/11/2017 03:19    Scheduled Meds: . amphetamine-dextroamphetamine  30 mg Oral Daily  . carbamazepine  400 mg Oral QHS  . DULoxetine  120 mg Oral QHS  . lidocaine  1 patch Transdermal Q24H  . polyethylene glycol  17 g Oral Daily  . senna  1 tablet Oral BID  . sodium chloride flush  3 mL Intravenous Q12H   Continuous Infusions: . sodium chloride  LOS: 2 days   Time spent: 25 minutes    Faye Ramsay, MD [facility] Pager [phone]  If 7PM-7AM, please contact night-coverage [email or web address] Password [facility] 06/12/2017, 4:34 PM

## 2017-06-12 NOTE — Consult Note (Signed)
[place] [facility] Consult   Reason for Consult:  Non-epileptic seizures Referring Physician:  Dr. Doyle Askew Patient Identification: Penny [name] MRN:  [id number] Principal Diagnosis: Seizure-like activity [facility]) Diagnosis:   Patient Active Problem List   Diagnosis Date Noted  . Abnormal urine odor [R82.90] 03/30/2017  . Urinary tract infection with hematuria [N39.0, R31.9] 03/30/2017  . Screening for genitourinary condition [Z13.89] 03/30/2017  . Pain [R52] 03/30/2017  . Burning with urination [R30.0] 03/30/2017  . Family history of breast cancer in mother,uncertain BR CA status [Z80.3] 02/28/2017  . Fibroid [D25.9] 01/18/2017  . Chronic pain syndrome [G89.4] 12/05/2016  . Folate deficiency [E53.8] 12/04/2016  . Nausea vomiting and diarrhea [R11.2, R19.7] 12/04/2016  . Severe anemia [D64.9] 12/04/2016  . Symptomatic anemia [D64.9] 12/03/2016  . Complaints of leg weakness [R29.898]   . Paresthesia [R20.2]   . Left-sided weakness [R53.1]   . Uncontrolled pain [R52] 01/24/2016  . Malnutrition of moderate degree [E44.0] 01/24/2016  . Neuropathy [G62.9] 01/23/2016  . Inability to walk [R26.2] 01/23/2016  . Paresthesias [R20.2]   . Bilateral leg numbness [R20.0] 11/26/2015  . Major depressive disorder, recurrent episode, 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[place]) [F33.2] 05/10/2015  . Post partum depression [F53] 05/09/2015  . Anastomotic ulcer [K28.9]   . Dysphagia [R13.10]   . Hematemesis [K92.0] 04/07/2015  . Intractable vomiting with nausea [R11.2] 04/07/2015  . Elevated liver enzymes [R74.8]   . Epigastric pain [R10.13]   . 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 Seizure-like activity ([place]) [R56.9] 02/22/2015  . Fibromyalgia [M79.7] 02/18/2015  . Vulvar fissure [N90.89] 02/18/2015  . Clinical depression [F32.9] 02/01/2015  . Current smoker [F17.200] 01/29/2015  . Current tobacco use [Z72.0] 01/29/2015  . Hereditary and idiopathic peripheral neuropathy [G60.9]  01/15/2015  . Leg weakness, bilateral [R29.898] 01/02/2015  . Gait disorder [R26.9] 01/02/2015  . Other symptoms and signs involving the musculoskeletal system [R29.898] 01/02/2015  . Abnormal gait [R26.9] 01/02/2015  . Cervicalgia [M54.2] 12/18/2014  . Sciatica of left side [M54.32] 11/14/2014  . Neuralgia neuritis, sciatic nerve [M54.30] 11/14/2014  . 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 Encounter for sterilization [Z30.2] 09/09/2014  . Abdominal pain, acute, right lower quadrant [R10.31] 08/22/2014  . Headache, migraine [G43.909] 08/19/2014  . Seizure ([place]) [R56.9] 08/18/2014  . Encephalopathy [G93.40] 08/13/2014  . Headache [R51] 08/13/2014  . Altered mental status [R41.82] 08/13/2014  . Right leg numbness [R20.0] 07/31/2014  . Disturbance of skin sensation [R20.9] 07/31/2014  . Hypertension in pregnancy, transient [O13.9] 07/08/2014  . Previous gastric bypass affecting pregnancy, antepartum [O99.840] 05/22/2014  . 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 ASD (atrial septal defect), ostium secundum [Q21.1] 05/17/2014  . Depression complicating pregnancy in second trimester, antepartum [O59.292, F32.9] 05/09/2014  . Antepartum mental disorder in pregnancy [O99.340] 05/09/2014  . Rapid palpitations [R00.2] 02/18/2014  . H/O maternal third degree perineal laceration, currently pregnant [O09.299] 02/18/2014  . High-risk pregnancy [O09.90] 02/18/2014  . Supervision of pregnancy with other poor reproductive or obstetric history, unspecified trimester [O09.299] 02/18/2014  . Restless legs [G25.81] 01/16/2014  . Restless leg [G25.81] 01/16/2014  . Chronic interstitial cystitis [N30.10] 10/23/2013  . Menorrhagia [N92.0] 07/18/2013  . Excessive and frequent menstruation [N92.0] 07/18/2013  . h/o Opiate addiction [F11.20] 03/11/2012    Class: Acute  . History of migraine headaches [Z86.69] 03/10/2012    Class: Acute  . Depression with anxiety [F41.8]  03/10/2012    Class: Chronic  . 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 [  F41.0] 03/10/2012    Class: Chronic  . 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 Panic disorder without agoraphobia [F41.0] 03/10/2012  . H/O disease [Z87.898] 03/10/2012  . Dysthymia [F34.1] 03/10/2012  . Pelvic congestion syndrome [N94.89] 10/13/2011  . Coitalgia [IMO0002] 10/13/2011  . Chronic migraine without aura [G43.709] 10/13/2011  . Unspecified dyspareunia [IMO0002] 10/13/2011  . IBS (irritable bowel syndrome) [K58.9] 08/25/2011  . Diarrhea [R19.7] 05/27/2011  . OBESITY, UNSPECIFIED [E66.9] 09/17/2010  . Transaminitis [R74.0] 09/17/2010  . Anemia, iron deficiency [D50.9] 07/23/2010    Total Time spent with patient: 30 minutes  Subjective:   Penny [name] is a 31 y.o. female patient admitted with multiple medical problems including chronic pain, depression, migraines, HNPP, is presently admitted with an episode of seizure-like activity. This is her first such activity in the past 1 year. EEG has been unremarkable, suspicious of nonepileptic seizures. Psychiatry consult did for additional recommendations.  HPI:  Penny [name] is polite and cooperative on interview. Her mother is present as well. Mom and the patient both agree that the patient has a history of nonepileptic seizures, and that when she is stressed, this definitely does precipitate worsening nonepileptic seizures. The patient has a lot of shame about this diagnosis, and feels like people think she is faking or that this was a voluntary. I spent time with the patient validating the diagnosis of nonepileptic seizures, and providing support and discussing this as a very real physiologic manifestation of emotional pain.    The patient reports that her mood has been stable over the past 8-9 months, and she reports that she has felt like she's been doing pretty good. She  reports that she spends most of her days taking care of her kids. She reports that she has been adherent to her medications, she has not had any acute medical illnesses lately. She reports that she keeps a healthy diet and has not had any changes in that. She denies any fever or URI. She denies any drug or alcohol use. She reports that she uses Adderall sparingly, approximately once a week, and had not used it prior to this admission.  The only thing that she can think of that could be contributing to her spells is that her sleep has been poor. He reports that she can go a couple nights without sleeping and then wind up being very tired during the day and needing to take naps. She can be admittedly more cranky on those days. She reports that Balsamra used to be helpful for her but has lost some of its effect.  She is agreeable to trial of a different sleep medication. I discussed the use of Seroquel with her as an augmenting agent for her antidepressant and to also help with sleep. She was agreeable to a trial of this medication tonight.   It is unclear if the patient has any history of sleep apnea, but she reports that she does snore, and does struggle with excessive daytime sleepiness. She would be interested in a sleep study if possible.   She reports that she has good therapy with her provider at [facility], and sees [name] for psychiatric medication management at the same office.  She is interested in a second opinion on an outpatient basis.  Past Psychiatric History: Most recent psychiatric admission was in June 2016 with worsening depression and anxiety and pseudoseizures.  Risk to Self: Is patient at risk  for suicide?: No Risk to Others:   Prior Inpatient Therapy:   Prior Outpatient Therapy:    Past Medical History:  Past Medical History:  Diagnosis Date  . ADD (attention deficit disorder)   . Anemia 2011   2o to GASTRIC BYPASS  . Anginal pain ([place])   .  Anxiety   . Blood transfusion without reported diagnosis   . Chronic daily headache   . Depression   . Depression   . Dysmenorrhea 09/18/2013  . Dysrhythmia   . Elevated liver enzymes JUL 2011ALK PHOS 111-127 AST  143-267 ALT  213-321T BILI 0.6  ALB  3.7-4.06 Jun 2011 ALK PHOS 118 AST 24 ALT 42 T BILI 0.4 ALB 3.9  . Encounter for drug rehabilitation    at behavioral health for opioid addiction about 4 years ago  . Family history of adverse reaction to anesthesia    'dad had to be kept on pump for breathing for morphine'  . Fatty liver   . Fibroid 01/18/2017  . Fibromyalgia   . Gastric bypass status for obesity   . Gastritis JULY 2011  . Heart murmur   . 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 History of Holter monitoring   . Hx of opioid abuse    for about 4 years, about 4 years ago  . Interstitial cystitis   . Iron deficiency anemia 07/23/2010  . Irritable bowel syndrome 2012 DIARRHEA   JUN 2012 TTG IgA 14.9  . IUD FEB 2010  . Lupus   . Menorrhagia 07/18/2013  . Migraines   . Obesity (BMI 30-39.9) 2011 228 LBS BMI 36.8  . Ovarian cyst   . Patient desires pregnancy 09/18/2013  . Polyneuropathy   . PONV (postoperative nausea and vomiting) seizure post-operatively  . Potassium (K) deficiency   . Pregnant 12/25/2013  . Psychiatric pseudoseizure   . RLQ abdominal pain 07/18/2013  . Sciatica of left side 11/14/2014  . Seizures ([place]) 07/31/2014   non-epileptic  . Stress 09/18/2013    Past Surgical History:  Procedure Laterality Date  . BIOPSY  04/10/2015   Procedure: BIOPSY;  Surgeon: Daneil Dolin, MD;  Location: [facility];  Service: Endoscopy;;  . cath self every nite     for sodium bicarb injection (discontinued 2013)  . CHOLECYSTECTOMY  2005   biliary dyskinesia  . COLONOSCOPY  JUN 2012 ABD PN/DIARRHEA WITH PROPOFOL   NL COLON  . DILATION AND CURETTAGE OF UTERUS    . DILITATION & CURRETTAGE/HYSTROSCOPY WITH NOVASURE ABLATION N/A 03/24/2017    Procedure: DILATATION & CURETTAGE/HYSTEROSCOPY WITH NOVASURE ENDOMETRIAL ABLATION;  Surgeon: Jonnie Kind, MD;  Location: [facility];  Service: Gynecology;  Laterality: N/A;  . ESOPHAGEAL DILATION N/A 04/10/2015   Procedure: ESOPHAGEAL DILATION WITH 54FR MALONEY DILATOR;  Surgeon: Daneil Dolin, MD;  Location: [facility];  Service: Endoscopy;  Laterality: N/A;  . ESOPHAGOGASTRODUODENOSCOPY    . ESOPHAGOGASTRODUODENOSCOPY (EGD) WITH PROPOFOL N/A 04/10/2015   Procedure: ESOPHAGOGASTRODUODENOSCOPY (EGD) WITH PROPOFOL;  Surgeon: Daneil Dolin, MD;  Location: [facility];  Service: Endoscopy;  Laterality: N/A;  . ESOPHAGOGASTRODUODENOSCOPY (EGD) WITH PROPOFOL N/A 12/06/2016   Procedure: ESOPHAGOGASTRODUODENOSCOPY (EGD) WITH PROPOFOL;  Surgeon: Danie Binder, MD;  Location: [facility];  Service: Endoscopy;  Laterality: N/A;  . GAB  2007   in [place]-POUCH 5 CM  . GASTRIC BYPASS  06/2006  . HYSTEROSCOPY W/D&C N/A 09/12/2014   Procedure: DILATATION AND CURETTAGE /HYSTEROSCOPY;  Surgeon: Jonnie Kind, MD;  Location: [facility];  Service: Gynecology;  Laterality: N/A;  . LAPAROSCOPIC TUBAL LIGATION Bilateral 03/24/2017   Procedure: LAPAROSCOPIC TUBAL LIGATION ([place] Rings);  Surgeon: Jonnie Kind, MD;  Location: [facility];  Service: Gynecology;  Laterality: Bilateral;  . REPAIR VAGINAL CUFF N/A 07/30/2014   Procedure: REPAIR VAGINAL CUFF;  Surgeon: Mora Bellman, MD;  Location: [place] [facility];  Service: Gynecology;  Laterality: N/A;  . SAVORY DILATION  06/20/2012   Dr. Barnie Alderman gastritis/Ulcer in the mid jejunum. Empiric dilation.   . SURAL NERVE BX Left 02/25/2016   Procedure: LEFT SURAL NERVE BIOPSY;  Surgeon: Jovita Gamma, MD;  Location: [place] [facility];  Service: Neurosurgery;  Laterality: Left;  Left sural nerve biopsy  . TONSILLECTOMY    . TONSILLECTOMY AND ADENOIDECTOMY    . UPPER GASTROINTESTINAL ENDOSCOPY  JULY 2011 NAUSEA-D125,V6, PH 25   Bx; GASTRITIS, POUCH-5 CM LONG  . WISDOM TOOTH EXTRACTION      Family History:  Family History  Problem Relation Age of Onset  . Hemochromatosis Maternal Grandmother   . Migraines Maternal Grandmother   . Cancer Maternal Grandmother   . Breast cancer Maternal Grandmother   . Hypertension Father   . Diabetes Father   . Coronary artery disease Father   . Migraines Paternal Grandmother   . Breast cancer Paternal Grandmother   . Cancer Mother        breast  . Hemochromatosis Mother   . Breast cancer Mother   . Depression Mother   . Anxiety disorder Mother   . Coronary artery disease Paternal Grandfather   . Anxiety disorder Brother   . Bipolar disorder Brother   . Healthy Daughter   . Healthy Son    Family Psychiatric  History: anxiety, depression  Social History:  History  Alcohol Use No     History  Drug Use No    Comment: Previously opioid addiction went through rehab    Social History   Social History  . Marital status: Single    Spouse name: N/A  . Number of children: 2  . Years of education: some colle   Occupational History  .  Not Employed   Social History Main Topics  . Smoking status: Current Every Day Smoker    Packs/day: 0.25    Years: 6.00    Types: Cigarettes  . Smokeless tobacco: Never Used  . Alcohol use No  . Drug use: No     Comment: Previously opioid addiction went through rehab  . Sexual activity: Not Currently    Partners: Male    Birth control/ protection: Surgical     Comment: tubal   Other Topics Concern  . None   Social History Narrative   Patient is right handed.   Patient drinks one cup of coffee daily.   She lives in a one-story home with her two children (38 year old son, 60 month old daughter).   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 Gabapentin Other (See Comments)    Pt states that she was unresponsive after taking this medication, but her vitals  remained stable.    . Metoclopramide Hcl Anxiety and Other (See Comments)    Pt states that she felt like she was trapped in a box, and could not get out.  Pt also states that she had temporary loss of movement, weakness,  and tingling.    . Tramadol Other (See Comments)    Seizures  . Nucynta [Tapentadol]     vomiting  . Propofol Nausea And Vomiting  . Trazodone And Nefazodone Other (See Comments)    Makes pt "like a zombie"  . Zofran [Ondansetron] Other (See Comments)    Migraines   . Butrans [Buprenorphine]     Patch caused rash, didn't help with pain  . Latex Rash  . Lyrica [Pregabalin]     suicidal  . Tape Itching and Rash    Please use paper tape    Labs:  Results for orders placed or performed during the hospital encounter of 06/09/17 (from the past 48 hour(s))  CBC     Status: Abnormal   Collection Time: 06/11/17  4:00 AM  Result Value Ref Range   WBC 6.3 4.0 - 10.5 K/uL   RBC 3.80 (L) 3.87 - 5.11 MIL/uL   Hemoglobin 12.1 12.0 - 15.0 g/dL   HCT 37.3 36.0 - 46.0 %   MCV 98.2 78.0 - 100.0 fL   MCH 31.8 26.0 - 34.0 pg   MCHC 32.4 30.0 - 36.0 g/dL   RDW 13.1 11.5 - 15.5 %   Platelets 175 150 - 400 K/uL  Basic metabolic panel     Status: Abnormal   Collection Time: 06/11/17  4:00 AM  Result Value Ref Range   Sodium 141 135 - 145 mmol/L   Potassium 3.8 3.5 - 5.1 mmol/L   Chloride 111 101 - 111 mmol/L   CO2 23 22 - 32 mmol/L   Glucose, Bld 100 (H) 65 - 99 mg/dL   BUN 15 6 - 20 mg/dL   Creatinine, Ser 0.57 0.44 - 1.00 mg/dL   Calcium 8.5 (L) 8.9 - 10.3 mg/dL   GFR calc non Af Amer >60 >60 mL/min   GFR calc Af Amer >60 >60 mL/min    Comment: (NOTE) The eGFR has been calculated using the CKD EPI equation. This calculation has not been validated in all clinical situations. eGFR's persistently <60 mL/min signify possible Chronic Kidney Disease.    Anion gap 7 5 - 15  Culture, Urine     Status: None   Collection Time: 06/11/17  3:37 PM  Result Value Ref Range    Specimen Description URINE, CATHETERIZED    Special Requests NONE    Culture NO GROWTH    Report Status 06/12/2017 FINAL   Urinalysis, Routine w reflex microscopic     Status: Abnormal   Collection Time: 06/11/17  3:38 PM  Result Value Ref Range   Color, Urine YELLOW YELLOW   APPearance HAZY (A) CLEAR   Specific Gravity, Urine 1.030 1.005 - 1.030   pH 5.0 5.0 - 8.0   Glucose, UA NEGATIVE NEGATIVE mg/dL   Hgb urine dipstick NEGATIVE NEGATIVE   Bilirubin Urine NEGATIVE NEGATIVE   Ketones, ur NEGATIVE NEGATIVE mg/dL   Protein, ur NEGATIVE NEGATIVE mg/dL   Nitrite NEGATIVE NEGATIVE   Leukocytes, UA NEGATIVE NEGATIVE   *Note: Due to a large number of results and/or encounters for the requested time period, some results have not been displayed. A complete set of results can be found in Results Review.    Current Facility-Administered Medications  Medication Dose Route Frequency Provider Last Rate Last Dose  . 0.9 %  sodium chloride infusion  250 mL Intravenous PRN Derrill Kay A, MD      . amphetamine-dextroamphetamine (ADDERALL XR) 24 hr capsule 30 mg  30 mg Oral Daily  Theodis Blaze, MD      . carbamazepine (TEGRETOL) tablet 400 mg  400 mg Oral QHS Theodis Blaze, MD   400 mg at 06/11/17 2120  . clonazePAM (KLONOPIN) tablet 0.5 mg  0.5 mg Oral TID PRN Theodis Blaze, MD   0.5 mg at 06/11/17 2126  . diphenhydrAMINE (BENADRYL) capsule 25 mg  25 mg Oral Q8H PRN Theodis Blaze, MD   25 mg at 06/11/17 2120  . DULoxetine (CYMBALTA) DR capsule 120 mg  120 mg Oral QHS Theodis Blaze, MD   120 mg at 06/11/17 2128  . HYDROcodone-acetaminophen (NORCO/VICODIN) 5-325 MG per tablet 1-2 tablet  1-2 tablet Oral Q4H PRN Opyd, Ilene Qua, MD   2 tablet at 06/12/17 1132  . HYDROmorphone (DILAUDID) injection 1 mg  1 mg Intravenous Q4H PRN Theodis Blaze, MD   1 mg at 06/12/17 2409  . ketorolac (TORADOL) 15 MG/ML injection 15 mg  15 mg Intravenous Q6H PRN Opyd, Ilene Qua, MD      . lidocaine (LIDODERM) 5 % 1  patch  1 patch Transdermal Q24H Opyd, Ilene Qua, MD   1 patch at 06/11/17 0300  . polyethylene glycol (MIRALAX / GLYCOLAX) packet 17 g  17 g Oral Daily Theodis Blaze, MD   17 g at 06/12/17 7353  . promethazine (PHENERGAN) injection 12.5 mg  12.5 mg Intravenous Q6H PRN Opyd, Ilene Qua, MD   12.5 mg at 06/12/17 0244  . senna (SENOKOT) tablet 8.6 mg  1 tablet Oral BID Theodis Blaze, MD   8.6 mg at 06/12/17 2992  . sodium chloride flush (NS) 0.9 % injection 3 mL  3 mL Intravenous Q12H Derrill Kay A, MD   3 mL at 06/12/17 4268  . sodium chloride flush (NS) 0.9 % injection 3 mL  3 mL Intravenous PRN Phillips Grout, MD        Musculoskeletal: Strength & Muscle Tone: within normal limits Gait & Station: normal Patient leans: N/A  Psychiatric Specialty Exam: Physical Exam  ROS  Blood pressure 116/78, pulse 78, temperature 98.4 F (36.9 C), temperature source Oral, resp. rate 18, height _0  (1.676 m), weight 94.4 kg (208 lb 1.6 oz), SpO2 93 %.Body mass index is 33.59 kg/m.  General Appearance: Casual  Eye Contact:  Fair  Speech:  Clear and Coherent  Volume:  Normal  Mood:  Dysphoric  Affect:  Appropriate and Congruent  Thought Process:  Goal Directed  Orientation:  Full (Time, Place, and Person)  Thought Content:  Logical  Suicidal Thoughts:  No  Homicidal Thoughts:  No  Memory:  Immediate;   Good  Judgement:  Fair  Insight:  Fair  Psychomotor Activity:  Normal  Concentration:  Concentration: Good  Recall:  Good  Fund of Knowledge:  NA  Language:  Good  Akathisia:  Negative  Handed:  Right  AIMS (if indicated):     Assets:  Communication Skills Desire for Improvement Financial Resources/Insurance Housing Social Support Transportation  ADL's:  Intact  Cognition:  WNL  Sleep:        Treatment Plan Summary: Penny [name] is a 31 year old female with a psychiatric history of depression, prior opiate use disorder in sustained remission, nonepileptic seizures, chronic  pain, HNPP, migraines, and multiple other medical problems.  She presents for admission after a seizure-like episode, with episodes observed in the emergency department. Overnight EEG was unremarkable, and at this point it's unclear if the overnight EEG plans to be  repeated.   Psychiatry consult at for recommendations regarding mood and psychogenic seizures.  She has a history of snoring and excessive daytime sleepiness, and sleep apnea would certainly exacerbate her mood and contribute to an increased risk of seizures.  The patient does acknowledge that she struggles with nonepileptic seizures, but reports that her mood has been stable for the past 1 year, and she has not had any recent episodes until this most recent admission.  The one area where she does continue to struggle in terms of her mood and psychiatric symptoms, is related to sleep. I believe she would benefit from titration of a sleep aid as below, to also augment her antidepressant regimen.Marland Kitchen He denies any acute safety issues and does not require psychiatric hospitalization.  - Continue Duloxetine 120 mg daily - Initiate Seroquel 50 mg QHS; additional 50 mg QHS prn is okay if needed; 50-100 mg QHS would be an appropriate augmenting dose for depression, and would assist with sleep. I have reviewed the risks/benefits with patient - Okay to continue home dose of adderall  Disposition: No evidence of imminent risk to self or others at present.   Patient does not meet criteria for psychiatric inpatient admission. Supportive therapy provided about ongoing stressors. continued follow-up at [facility] outpatient behavioral health in [facility], MD 06/12/2017 1:23 PM

## 2017-06-12 NOTE — Progress Notes (Signed)
OT Cancellation Note  Patient Details Name: Penny [name] MRN: [id number] DOB: 24-Feb-1986   Cancelled Treatment:    Reason Eval/Treat Not Completed: Medical issues which prohibited therapy; spoke with RN, Pt currently experiencing seizure like symptoms, will follow and check back as schedule permits/as Pt is medically ready.   Lou Cal, OT Pager [phone] 06/12/2017   Raymondo Band 06/12/2017, 8:46 AM

## 2017-06-12 NOTE — Evaluation (Signed)
Physical Therapy Evaluation Patient Details Name: Penny [name] MRN: [id number] DOB: 26-Nov-1986 Today's Date: 06/12/2017   History of Present Illness  HPI: Penny [name] is a 31 y.o. 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   Clinical Impression   Pt admitted with above diagnosis. Pt currently with functional limitations due to the deficits listed below (see PT Problem List). Currently dependent on RW and walking extremely slowly and guardedly; Reports she has had these type of seizures before, was dependent on a wheelchair for mobility for some time, and as of Oct of f last year, she progressed off of needing wheelchair/RW; I encouraged her that as she did before, she can and will get better now;  Pt will benefit from skilled PT to increase their independence and safety with mobility to allow discharge to the venue listed below.       Follow Up Recommendations Outpatient PT;Supervision/Assistance - 24 hour(her insurance does not cover HHPT)    Equipment Recommendations  None recommended by PT (pretty well-equipped already)    Recommendations for Other Services OT consult     Precautions / Restrictions Precautions Precautions: Fall Precaution Comments: Seizures      Mobility  Bed Mobility Overal bed mobility: Needs Assistance Bed Mobility: Supine to Sit     Supine to sit: Min guard     General bed mobility comments: Minguard fo rsafety  Transfers Overall transfer level: Needs assistance Equipment used: Rolling walker (2 wheeled) Transfers: Sit to/from Stand Sit to Stand: Mod assist         General transfer comment: Mod assis tto power up; cues for hand placement and safety  Ambulation/Gait Ambulation/Gait assistance: Min guard;+2 safety/equipment Ambulation Distance (Feet): 40 Feet Assistive device: Rolling walker (2 wheeled) Gait Pattern/deviations:  Step-through pattern;Decreased step length - right;Decreased step length - left;Decreased stride length     General Gait Details: Noting fatigue and shakiness in arms towards end of walk  Stairs            Wheelchair Mobility    Modified Rankin (Stroke Patients Only)       Balance Overall balance assessment: Needs assistance           Standing balance-Leahy Scale: Poor Standing balance comment: Dependent on UE support                             Pertinent Vitals/Pain Pain Assessment: Faces Faces Pain Scale: Hurts a little bit Pain Location: Back pain, chronic Pain Descriptors / Indicators: Grimacing Pain Intervention(s): Monitored during session    Home Living Family/patient expects to be discharged to:: Private residence Living Arrangements: Parent;Children Available Help at Discharge: Family;Available 24 hours/day Type of Home: House Home Access: Stairs to enter Entrance Stairs-Rails: Psychiatric nurse of Steps: 2 Home Layout: Two level Home Equipment: Walker - 4 wheels;Wheelchair - manual      Prior Function Level of Independence: Independent               Hand Dominance        Extremity/Trunk Assessment   Upper Extremity Assessment Upper Extremity Assessment: Defer to OT evaluation    Lower Extremity Assessment Lower Extremity Assessment: Generalized weakness       Communication   Communication: No difficulties  Cognition Arousal/Alertness: Awake/alert Behavior During Therapy: WFL for tasks assessed/performed Overall Cognitive Status: Within Functional Limits for tasks  assessed                                        General Comments      Exercises     Assessment/Plan    PT Assessment Patient needs continued PT services  PT Problem List Decreased strength;Decreased activity tolerance;Decreased balance;Decreased mobility;Decreased coordination;Decreased knowledge of use of  DME;Decreased knowledge of precautions;Pain       PT Treatment Interventions DME instruction;Gait training;Stair training;Functional mobility training;Therapeutic activities;Therapeutic exercise;Balance training;Neuromuscular re-education;Cognitive remediation;Patient/family education    PT Goals (Current goals can be found in the Care Plan section)  Acute Rehab PT Goals Patient Stated Goal: get better and get home PT Goal Formulation: With patient Time For Goal Achievement: 06/26/17 Potential to Achieve Goals: Good    Frequency Min 3X/week   Barriers to discharge        Co-evaluation               AM-PAC PT "6 Clicks" Daily Activity  Outcome Measure Difficulty turning over in bed (including adjusting bedclothes, sheets and blankets)?: A Lot Difficulty moving from lying on back to sitting on the side of the bed? : A Little Difficulty sitting down on and standing up from a chair with arms (e.g., wheelchair, bedside commode, etc,.)?: Total Help needed moving to and from a bed to chair (including a wheelchair)?: A Lot Help needed walking in hospital room?: A Lot Help needed climbing 3-5 steps with a railing? : A Lot 6 Click Score: 12    End of Session Equipment Utilized During Treatment: Gait belt Activity Tolerance: Patient tolerated treatment well Patient left: in chair;with call bell/phone within reach;with chair alarm set Nurse Communication: Mobility status PT Visit Diagnosis: Unsteadiness on feet (R26.81);Other abnormalities of gait and mobility (R26.89)    Time: 0981-1914 PT Time Calculation (min) (ACUTE ONLY): 27 min   Charges:   PT Evaluation $PT Eval Moderate Complexity: 1 Procedure PT Treatments $Gait Training: 8-22 mins   PT G Codes:        Roney [name], PT  Acute Rehabilitation Services Pager [phone] Office [phone]   [place]ta [place] 06/12/2017, 12:35 PM

## 2017-06-13 DIAGNOSIS — R569 Unspecified convulsions: Principal | ICD-10-CM

## 2017-06-13 LAB — BLOOD GAS, ARTERIAL
ACID-BASE EXCESS: 3.9 mmol/L — AB (ref 0.0–2.0)
BICARBONATE: 29.9 mmol/L — AB (ref 20.0–28.0)
FIO2: 100
O2 SAT: 74.6 %
PCO2 ART: 63.1 mmHg — AB (ref 32.0–48.0)
Patient temperature: 98.6
pH, Arterial: 7.297 — ABNORMAL LOW (ref 7.350–7.450)
pO2, Arterial: 45 mmHg — ABNORMAL LOW (ref 83.0–108.0)

## 2017-06-13 MED ORDER — TOPIRAMATE 25 MG PO TABS
50.0000 mg | ORAL_TABLET | Freq: Every morning | ORAL | Status: DC
  Administered 2017-06-14: 50 mg via ORAL
  Filled 2017-06-13: qty 2

## 2017-06-13 MED ORDER — PROMETHAZINE HCL 25 MG PO TABS
12.5000 mg | ORAL_TABLET | Freq: Four times a day (QID) | ORAL | Status: DC | PRN
  Administered 2017-06-13: 12.5 mg via ORAL
  Filled 2017-06-13: qty 1

## 2017-06-13 MED ORDER — MIDAZOLAM HCL 2 MG/2ML IJ SOLN
1.0000 mg | Freq: Once | INTRAMUSCULAR | Status: AC
  Administered 2017-06-13: 1 mg via INTRAVENOUS

## 2017-06-13 MED ORDER — AMMONIA AROMATIC IN INHA
0.3000 mL | Freq: Once | RESPIRATORY_TRACT | Status: DC
Start: 2017-06-13 — End: 2017-06-14
  Filled 2017-06-13: qty 10

## 2017-06-13 MED ORDER — TOPIRAMATE 100 MG PO TABS
100.0000 mg | ORAL_TABLET | Freq: Every evening | ORAL | Status: DC
  Administered 2017-06-13: 100 mg via ORAL
  Filled 2017-06-13: qty 1

## 2017-06-13 MED ORDER — SODIUM CHLORIDE 0.9 % IV SOLN: 500.0000 mg | Freq: Two times a day (BID) | INTRAVENOUS | Status: DC

## 2017-06-13 MED ORDER — METHOCARBAMOL 500 MG PO TABS
500.0000 mg | ORAL_TABLET | Freq: Three times a day (TID) | ORAL | Status: DC | PRN
  Administered 2017-06-13: 500 mg via ORAL
  Filled 2017-06-13: qty 1

## 2017-06-13 MED ORDER — LORAZEPAM 2 MG/ML IJ SOLN
INTRAMUSCULAR | Status: AC
  Administered 2017-06-13: 2 mg
  Filled 2017-06-13: qty 1

## 2017-06-13 MED ORDER — QUETIAPINE FUMARATE 50 MG PO TABS: 50.0000 mg | ORAL_TABLET | Freq: Every day | ORAL | 0 refills | Status: DC

## 2017-06-13 MED ORDER — SODIUM CHLORIDE 0.9 % IV SOLN
500.0000 mg | Freq: Once | INTRAVENOUS | Status: DC
  Filled 2017-06-13: qty 5

## 2017-06-13 MED ORDER — TOPIRAMATE 25 MG PO TABS
50.0000 mg | ORAL_TABLET | Freq: Two times a day (BID) | ORAL | Status: DC
  Administered 2017-06-13: 50 mg via ORAL
  Filled 2017-06-13: qty 2

## 2017-06-13 MED ORDER — MIDAZOLAM HCL 2 MG/2ML IJ SOLN
INTRAMUSCULAR | Status: AC
  Filled 2017-06-13: qty 2

## 2017-06-13 MED ORDER — TOPIRAMATE 25 MG PO TABS: 50.0000 mg | ORAL_TABLET | Freq: Two times a day (BID) | ORAL | Status: DC

## 2017-06-13 NOTE — Progress Notes (Signed)
Rapid Response called for patient having seizures.  Patient placed on 100% NRB and on side for respiratory support.  Sats 100% and ABG drawn.  Pt began to track more, seizure activity less frequent and CCM arrived for consult.

## 2017-06-13 NOTE — Discharge Instructions (Signed)

## 2017-06-13 NOTE — Progress Notes (Signed)
STARTED DISPLAYING "SEIZURE LIKE" ACTIVITY AGAIN. TONIC MUSCLE TONE IN HANDS, ARMS, LEGS, WITH PERIODIC INTERVALS OF RELAXATION. SPITTING, CLEAR THIN DISCHARGE. SUCTION AT BEDSIDE. RAPID RESPONSE CALLED, DR MYERS NOTIFIED. BROUGHT KEPPRA TO BEDSIDE IN PREPARATION FOR HANGING. PT C/O PAIN AT IV SITE BEFORE ADMIN. IV CONSULT ORDERED.  RAPID RESPONSE ARRIVED, ASSESSED FOR NEW VASC ACCESS. FAMILY WISHES TO HAVE IV TEAM. DR Doyle Askew ARRIVED, PT ALERT ORIENTED, RECOGNIZING FAMILY MEMBERS WELL. GLASSES ON . EYES CLEAR, 2MM. NO OBVIOUS DISTRESS.

## 2017-06-13 NOTE — Progress Notes (Signed)
OT Cancellation Note  Patient Details Name: Penny [name] MRN: [id number] DOB: Jan 02, 1986   Cancelled Treatment:    Reason Eval/Treat Not Completed: Medical issues which prohibited therapy. Pt with prolonged seizure-like activity with concern for airway protection. Will check back as able and medically appropriate for OT treatment.   Norman Herrlich, MS OTR/L  Pager: [place] A I[place] Marty 06/13/2017, 11:24 AM

## 2017-06-13 NOTE — Progress Notes (Signed)
Subjective: Patient is awake and oriented to room, telling me she in the hospital and it is July  Exam: Vitals:   06/13/17 0500 06/13/17 0948  BP: 126/78 111/68  Pulse: 80 87  Resp: 18 18  Temp: 98.2 F (36.8 C) 97.9 F (36.6 C)    HEENT-  Normocephalic, no lesions, without obvious abnormality.  Normal external eye and conjunctiva.  Normal TM's bilaterally.  Normal auditory canals and external ears. Normal external nose, mucus membranes and septum.  Normal pharynx.    Neuro:  CN: Pupils are equal and round. They are symmetrically reactive from 3-->2 mm. EOMI without nystagmus. Facial sensation is intact to light touch. Face is symmetric at rest with normal strength and mobility. Hearing is intact to conversational voice. Palate elevates symmetrically and uvula is midline. Voice is normal in tone, pitch and quality. Bilateral SCM and trapezii are 5/5. Tongue is midline with normal bulk and mobility.   --of note--initially would not look to the left but when I went to the left side of the room she tracked me. Initially would not move arms and allowed to drop to the bed.  When given her glasses she quickly took then and raised both arms and put them on her face. .   Motor: Normal bulk, tone, and strength. 5/5 throughout. No drift.  Sensation: Intact to light touch.  DTRs: 2+, symmetric  Toes downgoing bilaterally. No pathologic reflexes.  Coordination: Finger-to-nose and heel-to-shin are without dysmetria     Pertinent Labs/Diagnostics: multiple non-epileptic events seen and captured on EEG while in hospital.    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  Overnight EEG showed psychogenic seizures.     Again,  Recommendations: 1) Psychotherapy 2) No further recommendations at this time. Please call with further questions or concerns.       Etta Quill PA-C [facility] [phone]      06/13/2017, 11:41 AM

## 2017-06-13 NOTE — Progress Notes (Signed)
Pt continues to display manipulative behavior toward staff and family. Has requested pain pain meds regularly, but refuses hydrocodone since 1pm dose. States that she feels too nauseous for meds by mouth. Phenergan IV admin at 3pm. Still refuses available pain meds. Notified Dr Doyle Askew by text re situation.

## 2017-06-13 NOTE — Consult Note (Signed)
Name: Penny [name] MRN: [id number] DOB: 03-23-1986    ADMISSION DATE:  06/09/2017 CONSULTATION DATE:  7/9  REFERRING MD :  Doyle Askew ([facility])   CHIEF COMPLAINT:  Seizure   BRIEF PATIENT DESCRIPTION: 31yo female with hx depression, chronic pain followed at pain clinic, pseudoseizures, ADD presented 7/5 with seizure-like activity after running out of pain medication 3 days prior to admit.  After the event she remained with with concern for loss of sensation in BLE and was admitted.  She was seen in consultation by neurology and had EEG which confirmed clinical suspicion for pseudoseizures. She was ready for d/c but on 7/9 had prolonged seizure-like activity with concern for airway protection and PCCM consulted.   SIGNIFICANT EVENTS    STUDIES:  7/8 EEG>>> This continuous EEG is normal. No epileptiform discharges, focal or lateralizing signs are seen. There Is no evidence of encephalopathy. 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  After the event she remained with with concern for loss of sensation in BLE and was admitted.  She was seen in consultation by neurology and had EEG which confirmed clinical suspicion for pseudoseizures. She was ready for d/c but on 7/9 had prolonged seizure-like activity with concern for airway protection and PCCM consulted.    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  has a past surgical history that includes GAB (2007); Cholecystectomy (2005); cath self every nite; Colonoscopy (JUN 2012 ABD PN/DIARRHEA WITH PROPOFOL); Upper gastrointestinal endoscopy (JULY 2011 NAUSEA-D125,V6, PH 25); Dilation and curettage of uterus; Tonsillectomy; Gastric bypass (06/2006); Savory dilation (06/20/2012); Tonsillectomy and adenoidectomy; Esophagogastroduodenoscopy; Wisdom tooth extraction; Repair vaginal cuff (N/A, 07/30/2014); Hysteroscopy w/D&C (N/A, 09/12/2014); Esophagogastroduodenoscopy (egd) with propofol (N/A, 04/10/2015); Esophageal dilation (N/A, 04/10/2015); biopsy (04/10/2015); Sural nerve bx (Left, 02/25/2016); Esophagogastroduodenoscopy (egd) with propofol (N/A, 12/06/2016); Laparoscopic tubal ligation (Bilateral, 03/24/2017); and Dilatation & currettage/hysteroscopy with novasure ablation (N/A, 03/24/2017). Prior to Admission medications   Medication Sig Start Date End Date Taking? Authorizing Provider  albuterol (PROVENTIL HFA;VENTOLIN HFA) 108 (90 Base) MCG/ACT inhaler Inhale 2 puffs into the lungs every 6 (six) hours  as needed for wheezing. 05/28/16  Yes Luking, Elayne Snare, MD  amphetamine-dextroamphetamine (ADDERALL XR) 30 MG 24 hr capsule Take 1 capsule (30 mg total) by mouth daily. Fill after 03/12/17 04/21/17  Yes Cloria Spring, MD  carbamazepine (TEGRETOL) 200 MG tablet Take  two tablets at bedtime 04/21/17  Yes Cloria Spring, MD  DULoxetine (CYMBALTA) 60 MG capsule Take 2 capsules (120 mg total) by mouth at bedtime. 04/21/17  Yes Cloria Spring, MD  HYDROcodone Bitartrate ER [facility] ER) 20 MG T24A Take 20 mg by mouth at bedtime.    Yes [provider]  Meth-Hyo-M Bl-Na Phos-Ph Sal (URIBEL) 118 MG CAPS Take 1 tid 03/30/17  Yes Estill Dooms, NP  Pediatric Multivitamins-Iron (FLINTSTONES PLUS IRON PO) Take 1 tablet by mouth 2 (two) times daily.    Yes [provider]  phenazopyridine (PYRIDIUM) 100 MG tablet TAKE (1) TABLET BY MOUTH (3) TIMES A DAY AS NEEDED FOR PAIN. 04/14/17  Yes Kathyrn Drown, MD  topiramate (TOPAMAX) 50 MG tablet Taking 1 in QAm and 2 QHS 04/21/17  Yes Cloria Spring, MD  clonazePAM (KLONOPIN) 0.5 MG tablet Take 1 tablet (0.5 mg total) by mouth 3 (three) times daily as needed for anxiety. 06/11/17 06/11/18  Theodis Blaze, MD  HYDROcodone-acetaminophen (NORCO/VICODIN) 5-325 MG tablet Take 1-2 tablets by mouth every 4 (four) hours as needed for moderate pain or severe pain. 06/11/17   Theodis Blaze, MD  ketorolac (TORADOL) 10 MG tablet TAKE (1) TABLET BY MOUTH EVERY 6 HOURS AS NEEDED. Patient not taking: Reported on 06/09/2017 04/12/17   Estill Dooms, NP  promethazine (PHENERGAN) 25 MG tablet Take 1 tablet (25 mg total) by mouth every 6 (six) hours as needed. 06/11/17   Theodis Blaze, MD  QUEtiapine (SEROQUEL) 50 MG tablet Take 1 tablet (50 mg total) by mouth at bedtime. 06/13/17   Theodis Blaze, MD   Allergies  Allergen Reactions  . Gabapentin Other (See Comments)    Pt states that she was unresponsive after taking this medication, but her vitals remained stable.    .  Metoclopramide Hcl Anxiety and Other (See Comments)    Pt states that she felt like she was trapped in a box, and could not get out.  Pt also states that she had temporary loss of movement, weakness, and tingling.    . Tramadol Other (See Comments)    Seizures  . Nucynta [Tapentadol]     vomiting  . Propofol Nausea And Vomiting  . Trazodone And Nefazodone Other (See Comments)    Makes pt "like a zombie"  . Zofran [Ondansetron] Other (See Comments)    Migraines   . Butrans [Buprenorphine]     Patch caused rash, didn't help with pain  . Latex Rash  . Lyrica [Pregabalin]     suicidal  . Tape Itching and Rash    Please use paper tape    FAMILY HISTORY:  family history includes Anxiety disorder in her brother and mother; Bipolar disorder in her brother; Breast cancer in her maternal grandmother, mother, and paternal grandmother; Cancer in her maternal grandmother and mother; Coronary artery disease in her father and paternal grandfather; Depression in her mother; Diabetes in her father; Healthy in her daughter and son; Hemochromatosis in her maternal grandmother and mother; Hypertension in her father; Migraines in her maternal grandmother and paternal grandmother. SOCIAL HISTORY:  reports that she has been smoking Cigarettes.  She has a 1.50 pack-year smoking history. She has never used smokeless tobacco. She reports that she does not drink alcohol or use drugs.  REVIEW OF SYSTEMS:   Unable, pt not responsive to questions.  As per HPI obtained from records and Dr. Doyle Askew.   SUBJECTIVE:   VITAL SIGNS: Temp:  [  97.8 F (36.6 C)-98.4 F (36.9 C)] 97.9 F (36.6 C) (07/09 0948) Pulse Rate:  [71-87] 87 (07/09 0948) Resp:  [18-20] 18 (07/09 0948) BP: (111-126)/(68-82) 111/68 (07/09 0948) SpO2:  [95 %-98 %] 96 % (07/09 0948)  PHYSICAL EXAMINATION: General:  Young overweight female, NAD  Neuro:  Observed seizure like activity with arms outstretched and tense, after several minutes she  relaxes, eyes open, tracking, protecting airway  HEENT:  Mm moist, no JVD  Cardiovascular:  s1s2 rrr Lungs:  resps even non labored, clear, maintaining O2 sats  Abdomen:  Round, soft, nontender  Musculoskeletal: warm and dry, no edema     Recent Labs Lab 06/09/17 1708 06/11/17 0400  NA 140 141  K 3.6 3.8  CL 106 111  CO2 25 23  BUN 16 15  CREATININE 0.67 0.57  GLUCOSE 84 100*    Recent Labs Lab 06/09/17 1708 06/11/17 0400  HGB 14.8  9.0* 12.1  HCT 43.4  29.6* 37.3  WBC 8.5  3.7* 6.3  PLT 288  153 175   No results found.  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  Protecting airway, maintaining sats   REC -  SDU tx to monitor  Psych following - seroquel added  Consider addition scheduled benzo PRN versed No need for intubation    Nickolas Madrid, NP 06/13/2017  11:22 AM Pager: [phone] or [phone]

## 2017-06-13 NOTE — Significant Event (Signed)
Rapid Response Event Note  Overview: Time Called: 6222 Arrival Time: 1040 Event Type: Neurologic  Initial Focused Assessment:  Called by RN stating pt was having a seizure.  On arrival, pt was found in L lying position with supplemental O2 via [place] and suction at bedside. RN, NT and PT at bedside.  Pt did not respond to sternal rub, appeared rigid in all extremities. Pupils 33mm bilat and briskly reactive.  Physical therapist states that the symptoms began at approximately 1014 with acute rigidity and unresponsiveness.   Dr. Doyle Askew arrived and pt was given a total of 2mg  versed with no improvement in symptoms.  Seizure like activity appeared to occur every 45 seconds with brief periods of relaxation.  Pt's HR would fluctuate during seizure-like activity, reaching 150 ST and returning to baseline during pt relaxation.  This went on for over 30 minutes.  O2 saturation remained 98-100 with pt on NRB at 15 lpm.  BP remained WNL throughout encounter.    ABG drawn by RRT.  CCM arrived, pt began to relax and open eyes.  Rigidity of extremities resolved, pt able to track with eyes.     Primary MD placed order for SDU.    Update 1230:  Rounded on pt, still in 5M01, sobbing in bed, very emotional.  Moving all extremities, answering questions appropriately.  Discussed with primary RN Quillian Quince.      Event Summary: Name of Physician Notified: Dr. Doyle Askew, prior to my arrival at    Name of Consulting Physician Notified: Dr. Vaughan Browner  at [address] [place]

## 2017-06-13 NOTE — Discharge Summary (Addendum)
Physician Discharge Summary  [name] WER:[id number] DOB: 1986-01-19 DOA: 06/09/2017  PCP: Kathyrn Drown, MD  Admit date: 06/09/2017 Discharge date: 06/13/2017  Recommendations for Outpatient Follow-up:  1. Pt will need to follow up with PCP in 2-3 weeks post discharge 2. Please obtain BMP to evaluate electrolytes and kidney function 3. Please also check CBC to evaluate Hg and Hct levels 4. 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 Patient says she has ran out of pain medications three days prior to this admission, she was not able to get an appointment soon enough for refill. After the event, pt remained weak and concerned with loss of sensation and weakness in bilateral lower extremities.  Assessment & Plan:   Principal Problem:   Seizure-like activity ([place]) - appears to be pseudoseizures as pt is rather alert after the event with no clear post ictal phase - no evidence of seizures on EEG - psych consulted and added Seroquel, no need for inpatient psych  - also per neurology no further recommendations and nothing else to offer  Active Problems:   Bilateral LE weakness and loss of sensation -  unclear etiology, pt is able to bend her knees and withdraws to noxious stimuli  - MRI lumbar and thoracic spine with no etiology that could explain pt's symptoms  - outpatient PT/OT recommended and order placed     History of migraine headaches - resumed Topomax     ADD - resume Adderall     Narcotic induced constipation - continue bowel regimen    Procedures/Studies: Dg Lumbar Spine Complete  Result Date: 06/09/2017 CLINICAL DATA:  Low back pain and leg numbness. EXAM: LUMBAR SPINE - COMPLETE 4+ VIEW COMPARISON:  None. FINDINGS: There is no evidence of lumbar spine fracture. Alignment is normal. Intervertebral disc spaces are maintained. IMPRESSION: Negative. Electronically Signed   By: Dorise Bullion III M.D   On: 06/09/2017 22:18   Ct Head Wo Contrast  Result Date: 06/09/2017 CLINICAL DATA:  31 year old female with headache and probable seizure. EXAM: CT HEAD WITHOUT CONTRAST TECHNIQUE: Contiguous axial images were obtained from the base of the skull through the vertex without intravenous contrast. COMPARISON:  11/15/2015 CT and 01/23/2016 MR FINDINGS: Brain: No evidence of infarction, hemorrhage, hydrocephalus, extra-axial collection or mass lesion/mass effect. Vascular: No hyperdense vessel or unexpected calcification. Skull: Normal. Negative for fracture or focal lesion. Sinuses/Orbits: No acute finding. Other: None. IMPRESSION: Unremarkable noncontrast head CT. Electronically Signed   By: Margarette Canada M.D.   On: 06/09/2017 22:03   Mr Thoracic Spine Wo Contrast  Result Date: 06/11/2017 CLINICAL DATA:  Initial evaluation for bilateral lower extremity weakness with loss of sensation. Seizures. EXAM: MRI THORACIC AND LUMBAR SPINE WITHOUT CONTRAST TECHNIQUE: Multiplanar and  multiecho pulse sequences of the thoracic and lumbar spine were obtained without intravenous contrast. COMPARISON:  Prior MRI from 01/23/2016. FINDINGS: MRI THORACIC SPINE FINDINGS Alignment: Vertebral bodies normally  aligned with preservation of the normal thoracic kyphosis. No listhesis. Vertebrae: Mild chronic compression deformity involving the superior endplate of T8 noted, stable from previous. Vertebral body heights otherwise maintained. No other evidence for acute or interval fracture. Benign hemangioma noted within the T6 vertebral body. Signal intensity within the vertebral body bone marrow otherwise within normal limits. No other discrete or worrisome osseous lesions. No abnormal marrow edema. Cord: Signal intensity within the thoracic spinal cord is normal. Cord caliber within normal limits. Conus medullaris terminates below the T12 level. Paraspinal and other soft tissues: Paraspinous soft tissues within normal limits. Partially visualized lungs are clear. Visualized visceral structures within normal limits. Disc levels: Mild degenerative disc bulge noted at C5-6 and C6-7 on counter sequence. No significant degenerative disc pathology seen within the thoracic spine. No disc bulge or disc protrusion. No significant facet disease. No canal or foraminal stenosis. MRI LUMBAR SPINE FINDINGS Segmentation: Normal segmentation. Lowest well-formed disc labeled the L5-S1 level. Alignment: Vertebral bodies normally aligned with preservation of the normal lumbar lordosis. No listhesis. Vertebrae: Vertebral body heights are maintained. No evidence for acute or chronic fracture. Few small benign hemangiomas noted. Signal intensity within the vertebral body bone marrow within normal limits. No worrisome osseous lesions. Conus medullaris: Extends to the L1 level and appears normal. Paraspinal and other soft tissues: Paraspinous soft tissues within normal limits. Left adnexal cystic lesion likely reflects a physiologic ovarian cyst. Visualized visceral structures otherwise unremarkable. Disc levels: L1-2:  Unremarkable. L2-3:  Unremarkable. L3-4: Mild diffuse disc bulge with disc desiccation. Superimposed small right subarticular  disc extrusion with caudad angulation. Associated annular fissure. No associated stenosis or neural impingement. Foramina are widely patent. L4-5: Mild disc bulge with disc desiccation. Shallow right subarticular disc protrusion with associated caudad angulation. Associated annular fissure. Protruding disc contacts the descending right L5 nerve root in the right lateral recess without significant displacement. Resultant mild to moderate right lateral recess stenosis. Central canal remains widely patent. No foraminal narrowing. L5-S1:  Unremarkable. IMPRESSION: MR THORACIC SPINE IMPRESSION 1. Mild chronic T8 compression deformity, stable from previous. 2. Otherwise unremarkable MRI of the thoracic spine. MR LUMBAR SPINE IMPRESSION 1. Small disc extrusion at L3-4 with additional small disc protrusion at L4-5 as above, unchanged from previous. 2. Otherwise unremarkable MRI of the lumbar spine. Electronically Signed   By: Jeannine Boga M.D.   On: 06/11/2017 03:19   Mr Lumbar Spine Wo Contrast  Result Date: 06/11/2017 CLINICAL DATA:  Initial evaluation for bilateral lower extremity weakness with loss of sensation. Seizures. EXAM: MRI THORACIC AND LUMBAR SPINE WITHOUT CONTRAST TECHNIQUE: Multiplanar and multiecho pulse sequences of the thoracic and lumbar spine were obtained without intravenous contrast. COMPARISON:  Prior MRI from 01/23/2016. FINDINGS: MRI THORACIC SPINE FINDINGS Alignment: Vertebral bodies normally aligned with preservation of the normal thoracic kyphosis. No listhesis. Vertebrae: Mild chronic compression deformity involving the superior endplate of T8 noted, stable from previous. Vertebral body heights otherwise maintained. No other evidence for acute or interval fracture. Benign hemangioma noted within the T6 vertebral body. Signal intensity within the vertebral body bone marrow otherwise within normal limits. No other discrete or worrisome osseous lesions. No abnormal marrow edema. Cord:  Signal intensity within the thoracic spinal cord is normal. Cord caliber within normal limits. Conus medullaris terminates below the T12 level. Paraspinal and other  soft tissues: Paraspinous soft tissues within normal limits. Partially visualized lungs are clear. Visualized visceral structures within normal limits. Disc levels: Mild degenerative disc bulge noted at C5-6 and C6-7 on counter sequence. No significant degenerative disc pathology seen within the thoracic spine. No disc bulge or disc protrusion. No significant facet disease. No canal or foraminal stenosis. MRI LUMBAR SPINE FINDINGS Segmentation: Normal segmentation. Lowest well-formed disc labeled the L5-S1 level. Alignment: Vertebral bodies normally aligned with preservation of the normal lumbar lordosis. No listhesis. Vertebrae: Vertebral body heights are maintained. No evidence for acute or chronic fracture. Few small benign hemangiomas noted. Signal intensity within the vertebral body bone marrow within normal limits. No worrisome osseous lesions. Conus medullaris: Extends to the L1 level and appears normal. Paraspinal and other soft tissues: Paraspinous soft tissues within normal limits. Left adnexal cystic lesion likely reflects a physiologic ovarian cyst. Visualized visceral structures otherwise unremarkable. Disc levels: L1-2:  Unremarkable. L2-3:  Unremarkable. L3-4: Mild diffuse disc bulge with disc desiccation. Superimposed small right subarticular disc extrusion with caudad angulation. Associated annular fissure. No associated stenosis or neural impingement. Foramina are widely patent. L4-5: Mild disc bulge with disc desiccation. Shallow right subarticular disc protrusion with associated caudad angulation. Associated annular fissure. Protruding disc contacts the descending right L5 nerve root in the right lateral recess without significant displacement. Resultant mild to moderate right lateral recess stenosis. Central canal remains widely  patent. No foraminal narrowing. L5-S1:  Unremarkable. IMPRESSION: MR THORACIC SPINE IMPRESSION 1. Mild chronic T8 compression deformity, stable from previous. 2. Otherwise unremarkable MRI of the thoracic spine. MR LUMBAR SPINE IMPRESSION 1. Small disc extrusion at L3-4 with additional small disc protrusion at L4-5 as above, unchanged from previous. 2. Otherwise unremarkable MRI of the lumbar spine. Electronically Signed   By: Jeannine Boga M.D.   On: 06/11/2017 03:19    Discharge Exam: Vitals:   06/13/17 0948 06/13/17 1413  BP: 111/68 108/61  Pulse: 87 79  Resp: 18 18  Temp: 97.9 F (36.6 C) 98.1 F (36.7 C)   Vitals:   06/13/17 0207 06/13/17 0500 06/13/17 0948 06/13/17 1413  BP: 123/82 126/78 111/68 108/61  Pulse: 71 80 87 79  Resp: 18 18 18 18   Temp: 98.4 F (36.9 C) 98.2 F (36.8 C) 97.9 F (36.6 C) 98.1 F (36.7 C)  TempSrc: Oral Oral Oral Oral  SpO2: 98% 97% 96% 100%  Weight:      Height:        General: Pt is alert, follows commands appropriately, not in acute distress Cardiovascular: Regular rate and rhythm, S1/S2 +, no murmurs, no rubs, no gallops Respiratory: Clear to auscultation bilaterally, no wheezing, no crackles, no rhonchi Abdominal: Soft, non tender, non distended, bowel sounds +, no guarding  Discharge Instructions   Allergies as of 06/13/2017      Reactions   Gabapentin Other (See Comments)   Pt states that she was unresponsive after taking this medication, but her vitals remained stable.     Metoclopramide Hcl Anxiety, Other (See Comments)   Pt states that she felt like she was trapped in a box, and could not get out.  Pt also states that she had temporary loss of movement, weakness, and tingling.     Tramadol Other (See Comments)   Seizures   Nucynta [tapentadol]    vomiting   Propofol Nausea And Vomiting   Trazodone And Nefazodone Other (See Comments)   Makes pt "like a zombie"   Zofran [ondansetron] Other (See Comments)  Migraines    Butrans [buprenorphine]    Patch caused rash, didn't help with pain   Latex Rash   Lyrica [pregabalin]    suicidal   Tape Itching, Rash   Please use paper tape      Medication List    STOP taking these medications   HYSINGLA ER 20 MG T24a Generic drug:  HYDROcodone Bitartrate ER   ketorolac 10 MG tablet Commonly known as:  TORADOL     TAKE these medications   albuterol 108 (90 Base) MCG/ACT inhaler Commonly known as:  PROVENTIL HFA;VENTOLIN HFA Inhale 2 puffs into the lungs every 6 (six) hours as needed for wheezing.   amphetamine-dextroamphetamine 30 MG 24 hr capsule Commonly known as:  ADDERALL XR Take 1 capsule (30 mg total) by mouth daily. Fill after 03/12/17   carbamazepine 200 MG tablet Commonly known as:  TEGRETOL Take two tablets at bedtime   clonazePAM 0.5 MG tablet Commonly known as:  KLONOPIN Take 1 tablet (0.5 mg total) by mouth 3 (three) times daily as needed for anxiety.   DULoxetine 60 MG capsule Commonly known as:  CYMBALTA Take 2 capsules (120 mg total) by mouth at bedtime.   FLINTSTONES PLUS IRON PO Take 1 tablet by mouth 2 (two) times daily.   HYDROcodone-acetaminophen 5-325 MG tablet Commonly known as:  NORCO/VICODIN Take 1-2 tablets by mouth every 4 (four) hours as needed for moderate pain or severe pain.   phenazopyridine 100 MG tablet Commonly known as:  PYRIDIUM TAKE (1) TABLET BY MOUTH (3) TIMES A DAY AS NEEDED FOR PAIN.   promethazine 25 MG tablet Commonly known as:  PHENERGAN Take 1 tablet (25 mg total) by mouth every 6 (six) hours as needed. What changed:  See the new instructions.   QUEtiapine 50 MG tablet Commonly known as:  SEROQUEL Take 1 tablet (50 mg total) by mouth at bedtime.   topiramate 50 MG tablet Commonly known as:  TOPAMAX Taking 1 in QAm and 2 QHS   URIBEL 118 MG Caps Take 1 tid      Follow-up Information    Kathyrn Drown, MD Follow up.   Specialty:  Family Medicine Contact information: [address] [place] [address] [phone]            The results of significant diagnostics from this hospitalization (including imaging, microbiology, ancillary and laboratory) are listed below for reference.     Microbiology: Recent Results (from the past 240 hour(s))  Culture, Urine     Status: None   Collection Time: 06/11/17  3:37 PM  Result Value Ref Range Status   Specimen Description URINE, CATHETERIZED  Final   Special Requests NONE  Final   Culture NO GROWTH  Final   Report Status 06/12/2017 FINAL  Final     Labs: Basic Metabolic Panel:  Recent Labs Lab 06/09/17 1708 06/11/17 0400  NA 140 141  K 3.6 3.8  CL 106 111  CO2 25 23  GLUCOSE 84 100*  BUN 16 15  CREATININE 0.67 0.57  CALCIUM 9.0 8.5*   Liver Function Tests:  Recent Labs Lab 06/09/17 1708  AST 25  ALT 56*  ALKPHOS 96  BILITOT 0.6  PROT 7.4  ALBUMIN 4.5   No results for input(s): LIPASE, AMYLASE in the last 168 hours. No results for input(s): AMMONIA in the last 168 hours. CBC:  Recent Labs Lab 06/09/17 1708 06/11/17 0400  WBC 8.5  3.7* 6.3  HGB 14.8  9.0* 12.1  HCT 43.4  29.6* 37.3  MCV 94.8  83.4 98.2  PLT 288  153 175   Cardiac Enzymes: No results for input(s): CKTOTAL, CKMB, CKMBINDEX, TROPONINI in the last 168 hours. BNP: BNP (last 3 results) No results for input(s): BNP in the last 8760 hours.  ProBNP (last 3 results) No results for input(s): PROBNP in the last 8760 hours.  CBG:  Recent Labs Lab 06/09/17 1655  GLUCAP 91     SIGNED: Time coordinating discharge: 70 minutes  Faye Ramsay, MD  [facility] 06/13/2017, 2:21 PM Pager [phone]  If 7PM-7AM, please contact night-coverage [email or web address] Password [email or web address]

## 2017-06-13 NOTE — Consult Note (Signed)
[facility] Consult   Reason for Consult:  Depression and pseudoseizures Referring Physician:  Dr. Doyle Askew Patient Identification: Penny [name] MRN:  [id number] Principal Diagnosis: Seizure-like activity [facility]) Diagnosis:   Patient Active Problem List   Diagnosis Date Noted  . Abnormal urine odor [R82.90] 03/30/2017  . Urinary tract infection with hematuria [N39.0, R31.9] 03/30/2017  . Screening for genitourinary condition [Z13.89] 03/30/2017  . Pain [R52] 03/30/2017  . Burning with urination [R30.0] 03/30/2017  . Family history of breast cancer in mother,uncertain BR CA status [Z80.3] 02/28/2017  . Fibroid [D25.9] 01/18/2017  . Chronic pain syndrome [G89.4] 12/05/2016  . Folate deficiency [E53.8] 12/04/2016  . Nausea vomiting and diarrhea [R11.2, R19.7] 12/04/2016  . Severe anemia [D64.9] 12/04/2016  . Symptomatic anemia [D64.9] 12/03/2016  . Complaints of leg weakness [R29.898]   . Paresthesia [R20.2]   . Left-sided weakness [R53.1]   . Uncontrolled pain [R52] 01/24/2016  . Malnutrition of moderate degree [E44.0] 01/24/2016  . Neuropathy [G62.9] 01/23/2016  . Inability to walk [R26.2] 01/23/2016  . Paresthesias [R20.2]   . Bilateral leg numbness [R20.0] 11/26/2015  . 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 Post partum depression [F53] 05/09/2015  . Anastomotic ulcer [K28.9]   . Dysphagia [R13.10]   . Hematemesis [K92.0] 04/07/2015  . Intractable vomiting with nausea [R11.2] 04/07/2015  . Elevated liver enzymes [R74.8]   . Epigastric pain [R10.13]   . 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 Seizure-like activity ([place]) [R56.9] 02/22/2015  . Fibromyalgia [M79.7] 02/18/2015  . Vulvar fissure [N90.89] 02/18/2015  . Clinical depression [F32.9] 02/01/2015  . Current smoker [F17.200] 01/29/2015  . Current tobacco use [Z72.0] 01/29/2015  . Hereditary and idiopathic peripheral neuropathy [G60.9]  01/15/2015  . Leg weakness, bilateral [R29.898] 01/02/2015  . Gait disorder [R26.9] 01/02/2015  . Other symptoms and signs involving the musculoskeletal system [R29.898] 01/02/2015  . Abnormal gait [R26.9] 01/02/2015  . Cervicalgia [M54.2] 12/18/2014  . Sciatica of left side [M54.32] 11/14/2014  . Neuralgia neuritis, sciatic nerve [M54.30] 11/14/2014  . 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 Encounter for sterilization [Z30.2] 09/09/2014  . Abdominal pain, acute, right lower quadrant [R10.31] 08/22/2014  . Headache, migraine [G43.909] 08/19/2014  . Seizure ([place]) [R56.9] 08/18/2014  . Encephalopathy [G93.40] 08/13/2014  . Headache [R51] 08/13/2014  . Altered mental status [R41.82] 08/13/2014  . Right leg numbness [R20.0] 07/31/2014  . Disturbance of skin sensation [R20.9] 07/31/2014  . Hypertension in pregnancy, transient [O13.9] 07/08/2014  . Previous gastric bypass affecting pregnancy, antepartum [O99.840] 05/22/2014  . 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 ASD (atrial septal defect), ostium secundum [Q21.1] 05/17/2014  . Depression complicating pregnancy in second trimester, antepartum [X32.440, F32.9] 05/09/2014  . Antepartum mental disorder in pregnancy [O99.340] 05/09/2014  . Rapid palpitations [R00.2] 02/18/2014  . H/O maternal third degree perineal laceration, currently pregnant [O09.299] 02/18/2014  . High-risk pregnancy [O09.90] 02/18/2014  . Supervision of pregnancy with other poor reproductive or obstetric history, unspecified trimester [O09.299] 02/18/2014  . Restless legs [G25.81] 01/16/2014  . Restless leg [G25.81] 01/16/2014  . Chronic interstitial cystitis [N30.10] 10/23/2013  . Menorrhagia [N92.0] 07/18/2013  . Excessive and frequent menstruation [N92.0] 07/18/2013  . h/o Opiate addiction [F11.20] 03/11/2012    Class: Acute  . History of migraine headaches [Z86.69] 03/10/2012    Class: Acute  . Depression with anxiety [F41.8]  03/10/2012    Class: Chronic  . 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 [  F41.0] 03/10/2012    Class: Chronic  . 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 Panic disorder without agoraphobia [F41.0] 03/10/2012  . H/O disease [Z87.898] 03/10/2012  . Dysthymia [F34.1] 03/10/2012  . Pelvic congestion syndrome [N94.89] 10/13/2011  . Coitalgia [IMO0002] 10/13/2011  . Chronic migraine without aura [G43.709] 10/13/2011  . Unspecified dyspareunia [IMO0002] 10/13/2011  . IBS (irritable bowel syndrome) [K58.9] 08/25/2011  . Diarrhea [R19.7] 05/27/2011  . OBESITY, UNSPECIFIED [E66.9] 09/17/2010  . Transaminitis [R74.0] 09/17/2010  . Anemia, iron deficiency [D50.9] 07/23/2010    Total Time spent with patient: 1 hour  Subjective:   Penny [name] is a 31 y.o. female patient admitted with seuzures.  HPI:  Penny [name] is a 31 y.o. female, Seen, chart reviewed and case discussed with Dr. Doyle Askew and also patient stepfather who is at bedside for this face-to-face psychiatric consultation and evaluation of pseudoseizures and reportedly had one worse episode this morning. Patient is awake, alert oriented to time place person and situation. 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 Patient is known to this provider from her previous psychiatric inpatient consultation and also inpatient admission to the behavioral [facility] 2015 and 2016. Patient reportedly compliant with her medication without adverse affects and has been following up with the outpatient psychiatrist Dr. Harrington Challenger at [facility]. Patient was seen by Dr. Sharlene Dory yesterday and recommended Seroquel 50 mg at bedtime which  is reportedly helpful for her. Patient is worried about. Authorization for Seroquel and that she was informed that primary psychiatrist office skin complete prior Authorization for her. Patient requested restart her Topamax which helps for migraine headaches in addition to seizures. Patient has no symptoms of mood swings, irritability, agitation or aggressive behavior. Patient has no auditory/visual hallucinations, delusions or paranoia. Patient reportedly suffering with depression and anxiety and also has ADHD in the past. 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 Patient reported most of her current problems are associated with pregnancy about 3 years ago. UDS is positive for benzo's and barbiturates.  Past Psychiatric History: Depression, anxiety/PTSD and ADD. She had one previous acute psychiatric hospitalization and behavioral health Hospital in 2016.  Risk to Self: Is patient at risk for suicide?: No Risk to Others:   Prior Inpatient Therapy:   Prior Outpatient Therapy:    Past Medical History:  Past Medical History:  Diagnosis Date  . ADD (attention deficit disorder)   . Anemia 2011   2o to GASTRIC BYPASS  . Anginal pain ([place])   . Anxiety   . Blood transfusion without reported diagnosis   . Chronic daily headache   . Depression   . Depression   . Dysmenorrhea 09/18/2013  . Dysrhythmia   . Elevated liver enzymes JUL 2011ALK PHOS 111-127 AST  143-267 ALT  213-321T BILI 0.6  ALB  3.7-4.06 Jun 2011 ALK PHOS 118 AST 24 ALT 42 T BILI 0.4 ALB 3.9  . Encounter for drug rehabilitation    at behavioral health for opioid addiction about 4 years ago  . Family history of adverse reaction to anesthesia    'dad had to be kept on pump for  breathing for morphine'  . Fatty liver   . Fibroid 01/18/2017  . Fibromyalgia   . Gastric bypass status for obesity   . Gastritis JULY 2011  . Heart murmur   . Hereditary  and idiopathic peripheral neuropathy 01/15/2015  . History of Holter monitoring   . Hx of opioid abuse    for about 4 years, about 4 years ago  . Interstitial cystitis   . Iron deficiency anemia 07/23/2010  . Irritable bowel syndrome 2012 DIARRHEA   JUN 2012 TTG IgA 14.9  . IUD FEB 2010  . Lupus   . Menorrhagia 07/18/2013  . Migraines   . Obesity (BMI 30-39.9) 2011 228 LBS BMI 36.8  . Ovarian cyst   . Patient desires pregnancy 09/18/2013  . Polyneuropathy   . PONV (postoperative nausea and vomiting) seizure post-operatively  . Potassium (K) deficiency   . Pregnant 12/25/2013  . Psychiatric pseudoseizure   . RLQ abdominal pain 07/18/2013  . Sciatica of left side 11/14/2014  . 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 Stress 09/18/2013    Past Surgical History:  Procedure Laterality Date  . BIOPSY  04/10/2015   Procedure: BIOPSY;  Surgeon: Daneil Dolin, MD;  Location: [facility];  Service: Endoscopy;;  . cath self every nite     for sodium bicarb injection (discontinued 2013)  . CHOLECYSTECTOMY  2005   biliary dyskinesia  . COLONOSCOPY  JUN 2012 ABD PN/DIARRHEA WITH PROPOFOL   NL COLON  . DILATION AND CURETTAGE OF UTERUS    . DILITATION & CURRETTAGE/HYSTROSCOPY WITH NOVASURE ABLATION N/A 03/24/2017   Procedure: DILATATION & CURETTAGE/HYSTEROSCOPY WITH NOVASURE ENDOMETRIAL ABLATION;  Surgeon: Jonnie Kind, MD;  Location: [facility];  Service: Gynecology;  Laterality: N/A;  . ESOPHAGEAL DILATION N/A 04/10/2015   Procedure: ESOPHAGEAL DILATION WITH 54FR MALONEY DILATOR;  Surgeon: Daneil Dolin, MD;  Location: [facility];  Service: Endoscopy;  Laterality: N/A;  . ESOPHAGOGASTRODUODENOSCOPY    . ESOPHAGOGASTRODUODENOSCOPY (EGD) WITH PROPOFOL N/A 04/10/2015   Procedure: ESOPHAGOGASTRODUODENOSCOPY (EGD) WITH PROPOFOL;  Surgeon: Daneil Dolin, MD;  Location: [facility];  Service: Endoscopy;  Laterality: N/A;  . ESOPHAGOGASTRODUODENOSCOPY (EGD) WITH PROPOFOL N/A 12/06/2016   Procedure:  ESOPHAGOGASTRODUODENOSCOPY (EGD) WITH PROPOFOL;  Surgeon: Danie Binder, MD;  Location: [facility];  Service: Endoscopy;  Laterality: N/A;  . GAB  2007   in [place]-POUCH 5 CM  . GASTRIC BYPASS  06/2006  . HYSTEROSCOPY W/D&C N/A 09/12/2014   Procedure: DILATATION AND CURETTAGE /HYSTEROSCOPY;  Surgeon: Jonnie Kind, MD;  Location: [facility];  Service: Gynecology;  Laterality: N/A;  . LAPAROSCOPIC TUBAL LIGATION Bilateral 03/24/2017   Procedure: LAPAROSCOPIC TUBAL LIGATION ([place] Rings);  Surgeon: Jonnie Kind, MD;  Location: [facility];  Service: Gynecology;  Laterality: Bilateral;  . REPAIR VAGINAL CUFF N/A 07/30/2014   Procedure: REPAIR VAGINAL CUFF;  Surgeon: Mora Bellman, MD;  Location: [facility];  Service: Gynecology;  Laterality: N/A;  . SAVORY DILATION  06/20/2012   Dr. Barnie Alderman gastritis/Ulcer in the mid jejunum. Empiric dilation.   . SURAL NERVE BX Left 02/25/2016   Procedure: LEFT SURAL NERVE BIOPSY;  Surgeon: Jovita Gamma, MD;  Location: [facility] ORS;  Service: Neurosurgery;  Laterality: Left;  Left sural nerve biopsy  . TONSILLECTOMY    . TONSILLECTOMY AND ADENOIDECTOMY    . UPPER GASTROINTESTINAL ENDOSCOPY  JULY 2011 NAUSEA-D125,V6, PH 25   Bx; GASTRITIS, POUCH-5 CM LONG  . WISDOM TOOTH EXTRACTION     Family History:  Family History  Problem Relation Age of Onset  . Hemochromatosis Maternal Grandmother   . Migraines Maternal Grandmother   . Cancer Maternal Grandmother   . Breast cancer Maternal Grandmother   . Hypertension Father   . Diabetes Father   . Coronary artery disease Father   . Migraines Paternal Grandmother   . Breast cancer Paternal Grandmother   . Cancer Mother        breast  . Hemochromatosis Mother   . Breast cancer Mother   . Depression Mother   . Anxiety disorder Mother   . Coronary artery disease Paternal Grandfather   . Anxiety disorder Brother   . Bipolar disorder Brother   . Healthy Daughter   . Healthy Son    Family Psychiatric   History: Significant for depression and her mother by [place] mother. Social History:  History  Alcohol Use No     History  Drug Use No    Comment: Previously opioid addiction went through rehab    Social History   Social History  . Marital status: Single    Spouse name: N/A  . Number of children: 2  . Years of education: some colle   Occupational History  .  Not Employed   Social History Main Topics  . Smoking status: Current Every Day Smoker    Packs/day: 0.25    Years: 6.00    Types: Cigarettes  . Smokeless tobacco: Never Used  . Alcohol use No  . Drug use: No     Comment: Previously opioid addiction went through rehab  . Sexual activity: Not Currently    Partners: Male    Birth control/ protection: Surgical     Comment: tubal   Other Topics Concern  . None   Social History Narrative   Patient is right handed.   Patient drinks one cup of coffee daily.   She lives in a one-story home with her two children (53 year old son, 35 month old daughter).   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 Gabapentin Other (See Comments)    Pt states that she was unresponsive after taking this medication, but her vitals remained stable.    . Metoclopramide Hcl Anxiety and Other (See Comments)    Pt states that she felt like she was trapped in a box, and could not get out.  Pt also states that she had temporary loss of movement, weakness, and tingling.    . Tramadol Other (See Comments)    Seizures  . Nucynta [Tapentadol]     vomiting  . Propofol Nausea And Vomiting  . Trazodone And Nefazodone Other (See Comments)    Makes pt "like a zombie"  . Zofran [Ondansetron] Other (See Comments)    Migraines   . Butrans [Buprenorphine]     Patch caused rash,  didn't help with pain  . Latex Rash  . Lyrica [Pregabalin]     suicidal  . Tape Itching and Rash    Please use paper tape    Labs:  Results for orders placed or performed during the hospital encounter of 06/09/17 (from the past 48 hour(s))  Culture, Urine     Status: None   Collection Time: 06/11/17  3:37 PM  Result Value  Ref Range   Specimen Description URINE, CATHETERIZED    Special Requests NONE    Culture NO GROWTH    Report Status 06/12/2017 FINAL   Urinalysis, Routine w reflex microscopic     Status: Abnormal   Collection Time: 06/11/17  3:38 PM  Result Value Ref Range   Color, Urine YELLOW YELLOW   APPearance HAZY (A) CLEAR   Specific Gravity, Urine 1.030 1.005 - 1.030   pH 5.0 5.0 - 8.0   Glucose, UA NEGATIVE NEGATIVE mg/dL   Hgb urine dipstick NEGATIVE NEGATIVE   Bilirubin Urine NEGATIVE NEGATIVE   Ketones, ur NEGATIVE NEGATIVE mg/dL   Protein, ur NEGATIVE NEGATIVE mg/dL   Nitrite NEGATIVE NEGATIVE   Leukocytes, UA NEGATIVE NEGATIVE  Blood gas, arterial     Status: Abnormal   Collection Time: 06/13/17 11:08 AM  Result Value Ref Range   FIO2 100.00    Delivery systems NRB    pH, Arterial 7.297 (L) 7.350 - 7.450   pCO2 arterial 63.1 (H) 32.0 - 48.0 mmHg   pO2, Arterial 45.0 (L) 83.0 - 108.0 mmHg   Bicarbonate 29.9 (H) 20.0 - 28.0 mmol/L   Acid-Base Excess 3.9 (H) 0.0 - 2.0 mmol/L   O2 Saturation 74.6 %   Patient temperature 98.6    Collection site RIGHT RADIAL    Sample type ARTERIAL DRAW    Allens test (pass/fail) PASS PASS   *Note: Due to a large number of results and/or encounters for the requested time period, some results have not been displayed. A complete set of results can be found in Results Review.    Current Facility-Administered Medications  Medication Dose Route Frequency Provider Last Rate Last Dose  . 0.9 %  sodium chloride infusion  250 mL Intravenous PRN Derrill Kay A, MD      . amphetamine-dextroamphetamine (ADDERALL XR) 24 hr capsule 30  mg  30 mg Oral Daily Theodis Blaze, MD      . carbamazepine (TEGRETOL) tablet 400 mg  400 mg Oral QHS Theodis Blaze, MD   400 mg at 06/12/17 2300  . clonazePAM (KLONOPIN) tablet 0.5 mg  0.5 mg Oral TID PRN Theodis Blaze, MD   0.5 mg at 06/13/17 0837  . diphenhydrAMINE (BENADRYL) capsule 25 mg  25 mg Oral Q8H PRN Theodis Blaze, MD   25 mg at 06/11/17 2120  . DULoxetine (CYMBALTA) DR capsule 120 mg  120 mg Oral QHS Theodis Blaze, MD   120 mg at 06/12/17 2300  . HYDROcodone-acetaminophen (NORCO/VICODIN) 5-325 MG per tablet 1-2 tablet  1-2 tablet Oral Q4H PRN Opyd, Ilene Qua, MD   2 tablet at 06/12/17 2306  . ketorolac (TORADOL) 15 MG/ML injection 15 mg  15 mg Intravenous Q6H PRN Opyd, Ilene Qua, MD      . lidocaine (LIDODERM) 5 % 1 patch  1 patch Transdermal Q24H Opyd, Ilene Qua, MD   1 patch at 06/11/17 0300  . midazolam (VERSED) 2 MG/2ML injection           . polyethylene glycol (MIRALAX / GLYCOLAX) packet 17 g  17 g Oral Daily Theodis Blaze, MD   17 g at 06/13/17 [phone]  . promethazine (PHENERGAN) injection 12.5 mg  12.5 mg Intravenous Q6H PRN Opyd, Ilene Qua, MD   12.5 mg at 06/12/17 1726  . QUEtiapine (SEROQUEL) tablet 50 mg  50 mg Oral QHS Theodis Blaze, MD   50 mg at 06/13/17 0209  . senna (SENOKOT) tablet 8.6 mg  1 tablet Oral BID Dorothea Ogle, MD   8.6 mg at 06/13/17 0677  . sodium chloride flush (NS) 0.9 % injection 3 mL  3 mL Intravenous Q12H Tarry Kos A, MD   3 mL at 06/13/17 0838  . sodium chloride flush (NS) 0.9 % injection 3 mL  3 mL Intravenous PRN Haydee Monica, MD        Musculoskeletal: Strength & Muscle Tone: within normal limits Gait & Station: unable to stand Patient leans: N/A  Psychiatric Specialty Exam: Physical Exam as per history and physical   ROS complaining about frequent pseudoseizures, angry when somebody refer her seizures as  Fake-seizures. Patient complaining about leg weakness, numbness and migraine headaches. Patient is in no nausea vomiting,  abdomen pain, shortness of breath adjustment.  No Fever-chills, No Headache, No changes with Vision or hearing, reports vertigo No problems swallowing food or Liquids, No Chest pain, Cough or Shortness of Breath, No Abdominal pain, No Nausea or Vommitting, Bowel movements are regular, No Blood in stool or Urine, No dysuria, No new skin rashes or bruises, No new joints pains-aches,  No new weakness, tingling, numbness in any extremity, No recent weight gain or loss, No polyuria, polydypsia or polyphagia,  A full 10 point Review of Systems was done, except as stated above, all other Review of Systems were negative.   Blood pressure 111/68, pulse 87, temperature 97.9 F (36.6 C), temperature source Oral, resp. rate 18, height 5\' 6"  (1.676 m), weight 94.4 kg (208 lb 1.6 oz), SpO2 96 %.Body mass index is 33.59 kg/m.  General Appearance: Casual  Eye Contact:  Good  Speech:  Clear and Coherent  Volume:  Normal  Mood:  Depressed  Affect:  Appropriate and Congruent  Thought Process:  Coherent and Goal Directed  Orientation:  Full (Time, Place, and Person)  Thought Content:  WDL  Suicidal Thoughts:  No  Homicidal Thoughts:  No  Memory:  Immediate;   Good Recent;   Fair Remote;   Fair  Judgement:  Intact  Insight:  Good  Psychomotor Activity:  Decreased  Concentration:  Concentration: Good and Attention Span: Good  Recall:  Good  Fund of Knowledge:  Good  Language:  Good  Akathisia:  Negative  Handed:  Right  AIMS (if indicated):     Assets:  Communication Skills Desire for Improvement Financial Resources/Insurance Housing Leisure Time Resilience Social Support Transportation  ADL's:  Impaired  Cognition:  WNL  Sleep:        Treatment Plan Summary: 31 years old female admitted with recurrence of pseudoseizures while taking her medication and also suffering with depression, posttraumatic stress disorder 3 years. Patient has been suffering from the hereditary and  idiopathic peripheral neuropathy and ADD.  Recommendation Continue carbamazepine 400 mg daily at bedtime, will check carbamazepine levels tomorrow morning for therapeutic levels Will restart Topamax at 50 mg twice daily for migraine headaches Will continue Cymbalta 120 mg at bedtime for depression  Will continue clonazepam 0.5 mg 3 times daily as needed for for anxiety  Will continue Seroquel 50 mg at bedtime for insomnia  Daily contact with patient to assess and evaluate symptoms and progress in treatment and Medication management   Agent is willing to stay today and compliant with medication and also would like to work with the physical therapist while in the hospital.   Disposition: Patient will be referred to outpatient psychiatrist Dr. 37 in [place] when medically stable. No evidence of imminent risk to self or  others at present.   Patient does not meet criteria for psychiatric inpatient admission. Supportive therapy provided about ongoing stressors.  Ambrose Finland, MD 06/13/2017 1:08 PM

## 2017-06-13 NOTE — Progress Notes (Signed)
RN and PA Etta Quill called to room, food service states patient "is doing something." Patient had hands clenched at side, head turned to left, eyes closed, not responding to stimuli. PA concerned, sternal rubbed patient, patient did not respond. PA placed ammonia center nose, patient immediately opened eyes, moved head away from ammonia packet, grabbed PA's arm, and made a fist with other hand. Patient immediately stated "this jackass doctor is calling me fake!" PA said nothing during whole time in room. Kasandra Knudsen, RN updated on situation.

## 2017-06-13 NOTE — Progress Notes (Signed)
Seizure activity witness by PT at 1014. ON assessment pt rigid, some labored breath sounds. O2 SAT 97%. Dry heaves, no vomitting, small amount spit, suction performed, Dr Doyle Askew notified. Rapid response notified.New order for versed 1mg . Pt relocated to bed, defibrillator applied, monitor mode. Pt sr, 80 ranging up to 140 ST at intervals. Intervals of rigidity noted. Pt not responding to voice commands. 1mg  versed pushed at 1030, rigidity continued, no adverse events noted, airway intact. 1mg  ativan given at 11am, limited result. Some relaxation noted. Eyes opened, tracking. Looking at staff.At present pt talking, figiting with equipment, states that low back is in pain. Recumbent in bed, no difficulty breathing, sr 92 per monitor98 % ra.. Alert.

## 2017-06-13 NOTE — Significant Event (Signed)
During physical therapy session, patient developed seizure like symptoms while sitting in chair with therapist present. Nsg called to room at start of event 10:14 am. Patient's eyes rolled back and patient started to present with increased rigidity, unresponsive to any noxious stimuli (pain/nailbed pressure/sternal rub). Nsg to room with MD on phone and rapid response team initiated. Patient held in safe positioning, supplemental O2 applied and airway/VS monitored. Event lasting from 10:14am to approx 11:10 am (bouts of 30-45 seconds of elevated HR 150s, rigidity, and dry heaving followed by relaxation for approximately 10-20 seconds at a time). Patient left in room with rapid response and nurse present to assume care.  Full PT clinical note to follow.   Alben Deeds, PT DPT [facility]  [phone]

## 2017-06-13 NOTE — Progress Notes (Addendum)
Physical Therapy Treatment Patient Details Name: Penny [name] MRN: [id number] DOB: September 03, 1986 Today's Date: 06/13/2017    History of Present Illness Patient is a 31 y.o. 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     PT Comments    Patient seen for mobility progression in preparation for potential d/c home. Patient ambulated in room at supervision levels with RW. Cues for methods to quad set LLE due to weakness. Patient performed stair negotiation with min guard/supervision. Upon return to room, patient stated 'i feel like I am going to have a seizure' and patient began to show paroxysmal behaviors. Significant event documentation as follows:  During physical therapy session, patient developed seizure like symptoms while sitting in chair with therapist present. Nsg called to room at start of event 10:14 am. Patient's eyes rolled back and patient started to present with increased rigidity, unresponsive to any noxious stimuli (pain/nailbed pressure/sternal rub). Nsg to room with MD on phone and rapid response team initiated. Patient held in safe positioning, supplemental O2 applied and airway/VS monitored. Event lasting from 10:14am to approx 11:10 am (bouts of 30-45 seconds of elevated HR 150s, rigidity, and dry heaving followed by relaxation for approximately 10-20 seconds at a time). Patient left in room with rapid response and nurse present to assume care.  At this time, will continue to see patient and progress as tolerated. Current POC remains appropriate for patient   Follow Up Recommendations  Outpatient PT;Supervision/Assistance - 24 hour     Equipment Recommendations  None recommended by PT    Recommendations for Other Services       Precautions / Restrictions Precautions Precautions: Fall Precaution Comments: seizures Restrictions Weight Bearing Restrictions: No     Mobility  Bed Mobility Overal bed mobility: Needs Assistance Bed Mobility: Supine to Sit     Supine to sit: Supervision     General bed mobility comments: no physical assist required, supervision for safety  Transfers Overall transfer level: Needs assistance Equipment used: Rolling walker (2 wheeled) Transfers: Sit to/from Stand Sit to Stand: Supervision         General transfer comment: supervision for safety, no physical assist required  Ambulation/Gait Ambulation/Gait assistance: Min guard Ambulation Distance (Feet): 50 Feet (30 in room, 20 in hall) Assistive device: Rolling walker (2 wheeled) Gait Pattern/deviations: Step-through pattern;Decreased step length - right;Decreased step length - left;Decreased stride length   Gait velocity interpretation: Below normal speed for age/gender General Gait Details: Noting fatigue and shakiness in arms towards end of walk   Stairs Stairs: Yes   Stair Management: Two rails;Step to pattern;Forwards Number of Stairs: 6 General stair comments: performed with VCs for sequencing and quad setting LLE  Wheelchair Mobility    Modified Rankin (Stroke Patients Only)       Balance Overall balance assessment: Needs assistance Sitting-balance support: Feet supported Sitting balance-Leahy Scale: Fair     Standing balance support: Bilateral upper extremity supported Standing balance-Leahy Scale: Poor Standing balance comment: Dependent on UE support                            Cognition Arousal/Alertness: Awake/alert Behavior During Therapy: Flat affect Overall Cognitive Status: No family/caregiver present to determine baseline cognitive functioning  Exercises      General Comments        Pertinent Vitals/Pain Pain Assessment: Faces Faces Pain Scale: Hurts a little bit Pain Location: Back pain, chronic Pain Descriptors / Indicators: Grimacing Pain  Intervention(s): Monitored during session    Home Living                      Prior Function            PT Goals (current goals can now be found in the care plan section) Acute Rehab PT Goals Patient Stated Goal: get better and get home PT Goal Formulation: With patient Time For Goal Achievement: 06/26/17 Potential to Achieve Goals: Good Progress towards PT goals: Progressing toward goals    Frequency    Min 3X/week      PT Plan Current plan remains appropriate    Co-evaluation              AM-PAC PT "6 Clicks" Daily Activity  Outcome Measure  Difficulty turning over in bed (including adjusting bedclothes, sheets and blankets)?: A Little Difficulty moving from lying on back to sitting on the side of the bed? : A Little Difficulty sitting down on and standing up from a chair with arms (e.g., wheelchair, bedside commode, etc,.)?: A Little Help needed moving to and from a bed to chair (including a wheelchair)?: A Little Help needed walking in hospital room?: A Little Help needed climbing 3-5 steps with a railing? : A Little 6 Click Score: 18    End of Session Equipment Utilized During Treatment: Gait belt Activity Tolerance: Treatment limited secondary to medical complications (Comment) (see significant event notes) Patient left:  (with nsg (see significant event notes)) Nurse Communication: Mobility status PT Visit Diagnosis: Unsteadiness on feet (R26.81);Other abnormalities of gait and mobility (R26.89)     Time: 3159-4585 PT Time Calculation (min) (ACUTE ONLY): 71 min  Charges:  $Gait Training: 8-22 mins $Therapeutic Activity: 23-37 mins                    G Codes:       Alben Deeds, PT DPT [facility] [place] 06/13/2017, 1:41 PM

## 2017-06-13 NOTE — Care Management Note (Signed)
Case Management Note  Patient Details  Name: Penny [name] MRN: [id number] Date of Birth: [birth year]  Subjective/Objective:    Pt in with seizure like activity. She is from home with her parents.                Action/Plan: PT/OT recommending outpatient therapy. CM following for d/c needs, physician orders.  Expected Discharge Date:                  Expected Discharge Plan:  OP Rehab  In-House Referral:     Discharge planning Services     Post Acute Care Choice:    Choice offered to:     DME Arranged:    DME Agency:     HH Arranged:    HH Agency:     Status of Service:  In process, will continue to follow  If discussed at Long Length of Stay Meetings, dates discussed:    Additional Comments:  Pollie Friar, RN 06/13/2017, 3:53 PM

## 2017-06-14 ENCOUNTER — Other Ambulatory Visit: Payer: Self-pay | Admitting: Family Medicine

## 2017-06-14 LAB — CARBAMAZEPINE LEVEL, TOTAL: CARBAMAZEPINE LVL: 8 ug/mL (ref 4.0–12.0)

## 2017-06-14 MED ORDER — CLONAZEPAM 0.5 MG PO TABS: 0.5000 mg | ORAL_TABLET | Freq: Three times a day (TID) | ORAL | 0 refills | Status: DC | PRN

## 2017-06-14 MED ORDER — QUETIAPINE FUMARATE 50 MG PO TABS: 50.0000 mg | ORAL_TABLET | Freq: Every day | ORAL | 0 refills | Status: DC

## 2017-06-14 MED ORDER — TIZANIDINE HCL 4 MG PO TABS
4.0000 mg | ORAL_TABLET | Freq: Three times a day (TID) | ORAL | Status: DC | PRN
  Administered 2017-06-14: 4 mg via ORAL
  Filled 2017-06-14: qty 1

## 2017-06-14 MED ORDER — PROMETHAZINE HCL 25 MG PO TABS: 25.0000 mg | ORAL_TABLET | Freq: Four times a day (QID) | ORAL | 0 refills | Status: DC | PRN

## 2017-06-14 MED ORDER — OXYCODONE-ACETAMINOPHEN 5-325 MG PO TABS: 1.0000 | ORAL_TABLET | ORAL | 0 refills | Status: DC | PRN

## 2017-06-14 MED ORDER — OXYCODONE-ACETAMINOPHEN 5-325 MG PO TABS
1.0000 | ORAL_TABLET | ORAL | Status: DC | PRN
  Administered 2017-06-14 (×2): 2 via ORAL
  Filled 2017-06-14 (×2): qty 2

## 2017-06-14 MED ORDER — TIZANIDINE HCL 4 MG PO TABS: 4.0000 mg | ORAL_TABLET | Freq: Three times a day (TID) | ORAL | 0 refills | Status: DC | PRN

## 2017-06-14 NOTE — Care Management Note (Signed)
Case Management Note  Patient Details  Name: MADASYN HEATH MRN: [id number] Date of Birth: 06/06/1986  Subjective/Objective:                    Action/Plan: PT/OT recommending outpatient therapy. CM discussed with patient and currently she is refusing outpatient therapy. MD made aware.  Pt states she has transportation and assistance at home.   Expected Discharge Date:  06/14/17               Expected Discharge Plan:  OP Rehab  In-House Referral:     Discharge planning Services     Post Acute Care Choice:    Choice offered to:     DME Arranged:    DME Agency:     HH Arranged:    HH Agency:     Status of Service:  Completed, signed off  If discussed at [employer] of Stay Meetings, dates discussed:    Additional Comments:  Pollie Friar, RN 06/14/2017, 12:02 PM

## 2017-06-14 NOTE — Progress Notes (Signed)
Patient discharge to home. All questions answered at this time. RXs given. Transport home by family.   Ave Filter, RN

## 2017-06-14 NOTE — Discharge Summary (Signed)
Physician Discharge Summary  Penny [name] [facility] CXK:[id number] DOB: 29-Apr-1986 DOA: 06/09/2017  PCP: Kathyrn Drown, MD  Admit date: 06/09/2017 Discharge date: 06/14/2017  Recommendations for Outpatient Follow-up:  1. Pt will need to follow up with PCP in 2-3 weeks post discharge 2. Please obtain BMP to evaluate electrolytes and kidney function 3. Please also check CBC to evaluate Hg and Hct levels 4. Please note that pt has had multiple episodes of seizure like activity and no seizure was documented on EEG, considered pseudoseizures, no further recommendations per neurology team  5. Seroquel added to pt's home regimen  Discharge Diagnoses:  Principal Problem:   Seizure-like activity ([place]) Active Problems:   History of migraine headaches   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   h/o Opiate addiction   Hereditary and idiopathic peripheral neuropathy   Seizure (HCC)   Fibromyalgia   Pseudoseizure (HCC)   Chronic pain syndrome  Discharge Condition: Stable  Diet recommendation: Heart healthy diet discussed in details   History of present illness:  31 yo female with chronic pain in neck, arms and legs, following with pain clinic, pseudoseizures, ADD, presented to [name] after an episodes of seizure like activity. Patient says she has ran out of pain medications three days prior to this admission, she was not able to get an appointment soon enough for refill. After the event, pt remained weak and concerned with loss of sensation and weakness in bilateral lower extremities.  Assessment & Plan:   Principal Problem:   Seizure-like activity ([place]) - appears to be pseudoseizures as pt is rather alert after the event with no clear post ictal phase - no evidence of seizures on EEG - psych consulted and added Seroquel, no need for inpatient psych  - also per neurology no further recommendations and nothing else to offer  Active Problems:   Bilateral LE weakness and loss  of sensation - unclear etiology, pt is able to bend her knees and withdraws to noxious stimuli  - MRI lumbar and thoracic spine with no etiology that could explain pt's symptoms  - outpatient PT/OT recommended and order placed     History of migraine headaches - resumed Topomax     ADD - resume Adderall     Narcotic induced constipation - continue bowel regimen    Procedures/Studies: Dg Lumbar Spine Complete  Result Date: 06/09/2017 CLINICAL DATA:  Low back pain and leg numbness. EXAM: LUMBAR SPINE - COMPLETE 4+ VIEW COMPARISON:  None. FINDINGS: There is no evidence of lumbar spine fracture. Alignment is normal. Intervertebral disc spaces are maintained. IMPRESSION: Negative. Electronically Signed   By: Dorise Bullion III M.D   On: 06/09/2017 22:18   Ct Head Wo Contrast  Result Date: 06/09/2017 CLINICAL DATA:  31 year old female with headache and probable seizure. EXAM: CT HEAD WITHOUT CONTRAST TECHNIQUE: Contiguous axial images were obtained from the base of the skull through the vertex without intravenous contrast. COMPARISON:  11/15/2015 CT and 01/23/2016 MR FINDINGS: Brain: No evidence of infarction, hemorrhage, hydrocephalus, extra-axial collection or mass lesion/mass effect. Vascular: No hyperdense vessel or unexpected calcification. Skull: Normal. Negative for fracture or focal lesion. Sinuses/Orbits: No acute finding. Other: None. IMPRESSION: Unremarkable noncontrast head CT. Electronically Signed   By: Margarette Canada M.D.   On: 06/09/2017 22:03   Mr Thoracic Spine Wo Contrast  Result Date: 06/11/2017 CLINICAL DATA:  Initial evaluation for bilateral lower extremity weakness with loss of sensation. Seizures. EXAM: MRI THORACIC AND LUMBAR SPINE WITHOUT CONTRAST TECHNIQUE:  Multiplanar and multiecho pulse sequences of the thoracic and lumbar spine were obtained without intravenous contrast. COMPARISON:  Prior MRI from 01/23/2016. FINDINGS: MRI THORACIC SPINE FINDINGS Alignment: Vertebral  bodies normally aligned with preservation of the normal thoracic kyphosis. No listhesis. Vertebrae: Mild chronic compression deformity involving the superior endplate of T8 noted, stable from previous. Vertebral body heights otherwise maintained. No other evidence for acute or interval fracture. Benign hemangioma noted within the T6 vertebral body. Signal intensity within the vertebral body bone marrow otherwise within normal limits. No other discrete or worrisome osseous lesions. No abnormal marrow edema. Cord: Signal intensity within the thoracic spinal cord is normal. Cord caliber within normal limits. Conus medullaris terminates below the T12 level. Paraspinal and other soft tissues: Paraspinous soft tissues within normal limits. Partially visualized lungs are clear. Visualized visceral structures within normal limits. Disc levels: Mild degenerative disc bulge noted at C5-6 and C6-7 on counter sequence. No significant degenerative disc pathology seen within the thoracic spine. No disc bulge or disc protrusion. No significant facet disease. No canal or foraminal stenosis. MRI LUMBAR SPINE FINDINGS Segmentation: Normal segmentation. Lowest well-formed disc labeled the L5-S1 level. Alignment: Vertebral bodies normally aligned with preservation of the normal lumbar lordosis. No listhesis. Vertebrae: Vertebral body heights are maintained. No evidence for acute or chronic fracture. Few small benign hemangiomas noted. Signal intensity within the vertebral body bone marrow within normal limits. No worrisome osseous lesions. Conus medullaris: Extends to the L1 level and appears normal. Paraspinal and other soft tissues: Paraspinous soft tissues within normal limits. Left adnexal cystic lesion likely reflects a physiologic ovarian cyst. Visualized visceral structures otherwise unremarkable. Disc levels: L1-2:  Unremarkable. L2-3:  Unremarkable. L3-4: Mild diffuse disc bulge with disc desiccation. Superimposed small right  subarticular disc extrusion with caudad angulation. Associated annular fissure. No associated stenosis or neural impingement. Foramina are widely patent. L4-5: Mild disc bulge with disc desiccation. Shallow right subarticular disc protrusion with associated caudad angulation. Associated annular fissure. Protruding disc contacts the descending right L5 nerve root in the right lateral recess without significant displacement. Resultant mild to moderate right lateral recess stenosis. Central canal remains widely patent. No foraminal narrowing. L5-S1:  Unremarkable. IMPRESSION: MR THORACIC SPINE IMPRESSION 1. Mild chronic T8 compression deformity, stable from previous. 2. Otherwise unremarkable MRI of the thoracic spine. MR LUMBAR SPINE IMPRESSION 1. Small disc extrusion at L3-4 with additional small disc protrusion at L4-5 as above, unchanged from previous. 2. Otherwise unremarkable MRI of the lumbar spine. Electronically Signed   By: Jeannine Boga M.D.   On: 06/11/2017 03:19   Mr Lumbar Spine Wo Contrast  Result Date: 06/11/2017 CLINICAL DATA:  Initial evaluation for bilateral lower extremity weakness with loss of sensation. Seizures. EXAM: MRI THORACIC AND LUMBAR SPINE WITHOUT CONTRAST TECHNIQUE: Multiplanar and multiecho pulse sequences of the thoracic and lumbar spine were obtained without intravenous contrast. COMPARISON:  Prior MRI from 01/23/2016. FINDINGS: MRI THORACIC SPINE FINDINGS Alignment: Vertebral bodies normally aligned with preservation of the normal thoracic kyphosis. No listhesis. Vertebrae: Mild chronic compression deformity involving the superior endplate of T8 noted, stable from previous. Vertebral body heights otherwise maintained. No other evidence for acute or interval fracture. Benign hemangioma noted within the T6 vertebral body. Signal intensity within the vertebral body bone marrow otherwise within normal limits. No other discrete or worrisome osseous lesions. No abnormal marrow  edema. Cord: Signal intensity within the thoracic spinal cord is normal. Cord caliber within normal limits. Conus medullaris terminates below the T12 level. Paraspinal  and other soft tissues: Paraspinous soft tissues within normal limits. Partially visualized lungs are clear. Visualized visceral structures within normal limits. Disc levels: Mild degenerative disc bulge noted at C5-6 and C6-7 on counter sequence. No significant degenerative disc pathology seen within the thoracic spine. No disc bulge or disc protrusion. No significant facet disease. No canal or foraminal stenosis. MRI LUMBAR SPINE FINDINGS Segmentation: Normal segmentation. Lowest well-formed disc labeled the L5-S1 level. Alignment: Vertebral bodies normally aligned with preservation of the normal lumbar lordosis. No listhesis. Vertebrae: Vertebral body heights are maintained. No evidence for acute or chronic fracture. Few small benign hemangiomas noted. Signal intensity within the vertebral body bone marrow within normal limits. No worrisome osseous lesions. Conus medullaris: Extends to the L1 level and appears normal. Paraspinal and other soft tissues: Paraspinous soft tissues within normal limits. Left adnexal cystic lesion likely reflects a physiologic ovarian cyst. Visualized visceral structures otherwise unremarkable. Disc levels: L1-2:  Unremarkable. L2-3:  Unremarkable. L3-4: Mild diffuse disc bulge with disc desiccation. Superimposed small right subarticular disc extrusion with caudad angulation. Associated annular fissure. No associated stenosis or neural impingement. Foramina are widely patent. L4-5: Mild disc bulge with disc desiccation. Shallow right subarticular disc protrusion with associated caudad angulation. Associated annular fissure. Protruding disc contacts the descending right L5 nerve root in the right lateral recess without significant displacement. Resultant mild to moderate right lateral recess stenosis. Central canal  remains widely patent. No foraminal narrowing. L5-S1:  Unremarkable. IMPRESSION: MR THORACIC SPINE IMPRESSION 1. Mild chronic T8 compression deformity, stable from previous. 2. Otherwise unremarkable MRI of the thoracic spine. MR LUMBAR SPINE IMPRESSION 1. Small disc extrusion at L3-4 with additional small disc protrusion at L4-5 as above, unchanged from previous. 2. Otherwise unremarkable MRI of the lumbar spine. Electronically Signed   By: Jeannine Boga M.D.   On: 06/11/2017 03:19   Discharge Exam: Vitals:   06/14/17 0714 06/14/17 0951  BP: (!) 100/56 (P) 101/60  Pulse: 63 (P) 91  Resp: 18 (P) 18  Temp: 97.8 F (36.6 C) (P) 98.2 F (36.8 C)   Vitals:   06/13/17 2237 06/14/17 0105 06/14/17 0714 06/14/17 0951  BP: 112/62 108/63 (!) 100/56 (P) 101/60  Pulse: 70 92 63 (P) 91  Resp: 18 18 18  (P) 18  Temp: 98.4 F (36.9 C) 98.2 F (36.8 C) 97.8 F (36.6 C) (P) 98.2 F (36.8 C)  TempSrc: Oral Oral Oral (P) Oral  SpO2: 96% 99% 98% (P) 100%  Weight:      Height:        General: Pt is alert, follows commands appropriately, not in acute distress Cardiovascular: Regular rate and rhythm, S1/S2 +, no murmurs, no rubs, no gallops Respiratory: Clear to auscultation bilaterally, no wheezing, no crackles, no rhonchi Abdominal: Soft, non tender, non distended, bowel sounds +, no guarding  Discharge Instructions   Allergies as of 06/14/2017      Reactions   Gabapentin Other (See Comments)   Pt states that she was unresponsive after taking this medication, but her vitals remained stable.     Metoclopramide Hcl Anxiety, Other (See Comments)   Pt states that she felt like she was trapped in a box, and could not get out.  Pt also states that she had temporary loss of movement, weakness, and tingling.     Tramadol Other (See Comments)   Seizures   Nucynta [tapentadol]    vomiting   Propofol Nausea And Vomiting   Trazodone And Nefazodone Other (See Comments)  Makes pt "like a zombie"    Zofran [ondansetron] Other (See Comments)   Migraines   Butrans [buprenorphine]    Patch caused rash, didn't help with pain   Latex Rash   Lyrica [pregabalin]    suicidal   Tape Itching, Rash   Please use paper tape      Medication List    STOP taking these medications   HYSINGLA ER 20 MG T24a Generic drug:  HYDROcodone Bitartrate ER   ketorolac 10 MG tablet Commonly known as:  TORADOL     TAKE these medications   albuterol 108 (90 Base) MCG/ACT inhaler Commonly known as:  PROVENTIL HFA;VENTOLIN HFA Inhale 2 puffs into the lungs every 6 (six) hours as needed for wheezing.   amphetamine-dextroamphetamine 30 MG 24 hr capsule Commonly known as:  ADDERALL XR Take 1 capsule (30 mg total) by mouth daily. Fill after 03/12/17   carbamazepine 200 MG tablet Commonly known as:  TEGRETOL Take two tablets at bedtime   clonazePAM 0.5 MG tablet Commonly known as:  KLONOPIN Take 1 tablet (0.5 mg total) by mouth 3 (three) times daily as needed for anxiety.   DULoxetine 60 MG capsule Commonly known as:  CYMBALTA Take 2 capsules (120 mg total) by mouth at bedtime.   FLINTSTONES PLUS IRON PO Take 1 tablet by mouth 2 (two) times daily.   oxyCODONE-acetaminophen 5-325 MG tablet Commonly known as:  PERCOCET/ROXICET Take 1-2 tablets by mouth every 3 (three) hours as needed for moderate pain.   phenazopyridine 100 MG tablet Commonly known as:  PYRIDIUM TAKE (1) TABLET BY MOUTH (3) TIMES A DAY AS NEEDED FOR PAIN.   promethazine 25 MG tablet Commonly known as:  PHENERGAN Take 1 tablet (25 mg total) by mouth every 6 (six) hours as needed. What changed:  See the new instructions.   QUEtiapine 50 MG tablet Commonly known as:  SEROQUEL Take 1 tablet (50 mg total) by mouth at bedtime.   tiZANidine 4 MG tablet Commonly known as:  ZANAFLEX Take 1 tablet (4 mg total) by mouth every 8 (eight) hours as needed for muscle spasms.   topiramate 50 MG tablet Commonly known as:   TOPAMAX Taking 1 in QAm and 2 QHS   URIBEL 118 MG Caps Take 1 tid       Follow-up Information    Kathyrn Drown, MD Follow up.   Specialty:  Family Medicine Contact information: [address] [place] [address] [phone]            The results of significant diagnostics from this hospitalization (including imaging, microbiology, ancillary and laboratory) are listed below for reference.     Microbiology: Recent Results (from the past 240 hour(s))  Culture, Urine     Status: None   Collection Time: 06/11/17  3:37 PM  Result Value Ref Range Status   Specimen Description URINE, CATHETERIZED  Final   Special Requests NONE  Final   Culture NO GROWTH  Final   Report Status 06/12/2017 FINAL  Final     Labs: Basic Metabolic Panel:  Recent Labs Lab 06/09/17 1708 06/11/17 0400  NA 140 141  K 3.6 3.8  CL 106 111  CO2 25 23  GLUCOSE 84 100*  BUN 16 15  CREATININE 0.67 0.57  CALCIUM 9.0 8.5*   Liver Function Tests:  Recent Labs Lab 06/09/17 1708  AST 25  ALT 56*  ALKPHOS 96  BILITOT 0.6  PROT 7.4  ALBUMIN 4.5   No results  for input(s): LIPASE, AMYLASE in the last 168 hours. No results for input(s): AMMONIA in the last 168 hours. CBC:  Recent Labs Lab 06/09/17 1708 06/11/17 0400  WBC 8.5  3.7* 6.3  HGB 14.8  9.0* 12.1  HCT 43.4  29.6* 37.3  MCV 94.8  83.4 98.2  PLT 288  153 175   Cardiac Enzymes: No results for input(s): CKTOTAL, CKMB, CKMBINDEX, TROPONINI in the last 168 hours. BNP: BNP (last 3 results) No results for input(s): BNP in the last 8760 hours.  ProBNP (last 3 results) No results for input(s): PROBNP in the last 8760 hours.  CBG:  Recent Labs Lab 06/09/17 1655  GLUCAP 91     SIGNED: Time coordinating discharge: 70 minutes  Faye Ramsay, MD  [facility] 06/14/2017, 10:37 AM Pager [phone]  If 7PM-7AM, please contact night-coverage [email or web address] Password [email or web address]

## 2017-06-15 ENCOUNTER — Telehealth: Payer: Self-pay | Admitting: Family Medicine

## 2017-06-15 ENCOUNTER — Telehealth (HOSPITAL_COMMUNITY): Payer: Self-pay | Admitting: *Deleted

## 2017-06-15 NOTE — Telephone Encounter (Signed)
Patients step-father dropped off a paper needing to be signed by Dr. Nicki Reaper in order for Jaylie to be added to her mothers vision insurance.  See in yellow basket.

## 2017-06-15 NOTE — Telephone Encounter (Signed)
left voice message, provider out of office 07/01/17

## 2017-06-16 NOTE — Telephone Encounter (Signed)
Form completed.

## 2017-06-17 ENCOUNTER — Ambulatory Visit (HOSPITAL_COMMUNITY): Payer: Medicaid Other | Admitting: Psychiatry

## 2017-06-17 ENCOUNTER — Encounter (HOSPITAL_COMMUNITY): Payer: Self-pay

## 2017-06-21 ENCOUNTER — Telehealth: Payer: Self-pay | Admitting: Family Medicine

## 2017-06-21 DIAGNOSIS — R52 Pain, unspecified: Secondary | ICD-10-CM

## 2017-06-21 NOTE — Telephone Encounter (Signed)
Autumn-I checked the drug registry. This patient is on a long-acting pain medicine as well as oxycodone several per day. This is outside my area.(She should call back to the pain management doctors office to get their advice regarding any potential delay in being seen in regards to the medications that they have her on.)(Unfortunately we cannot be the intermittent or ongoing manager of these situations)

## 2017-06-21 NOTE — Telephone Encounter (Signed)
Pt was discharged from the hospital a week ago and her current pain mgmt was sold out and the ones that bought them out dont know when they can see her as a new pt. Pt states that she was taken off one of her medications at the hospital as well. Pt is requesting a call from the nurse regarding this.

## 2017-06-22 NOTE — Telephone Encounter (Signed)
Left message to return call 

## 2017-06-22 NOTE — Telephone Encounter (Signed)
Discussed with pt. Pt states they took her off of the long acting and the office is now shut down. Pt wanted to let you know. I told her I would send a note back to let you know.

## 2017-06-24 DIAGNOSIS — Z79899 Other long term (current) drug therapy: Secondary | ICD-10-CM | POA: Diagnosis not present

## 2017-06-24 DIAGNOSIS — M545 Low back pain: Secondary | ICD-10-CM | POA: Diagnosis not present

## 2017-06-27 ENCOUNTER — Encounter (INDEPENDENT_AMBULATORY_CARE_PROVIDER_SITE_OTHER): Payer: Self-pay | Admitting: *Deleted

## 2017-06-27 NOTE — Progress Notes (Unsigned)
Patient was called to see if she wanted earlier time for appt. On Tuesday 7/24.  She said she was doing good and doesn't feel she needs to come in.  If she get to feeling bad again she will call and talk to [name] to see if labs are need and if blood is low again.

## 2017-06-27 NOTE — Progress Notes (Signed)
Noted  

## 2017-06-28 ENCOUNTER — Ambulatory Visit (INDEPENDENT_AMBULATORY_CARE_PROVIDER_SITE_OTHER): Payer: Self-pay | Admitting: Internal Medicine

## 2017-06-28 NOTE — Telephone Encounter (Signed)
I recommend referring to other pain management the patient may also initiate phone calls to try to find a place that will do long-term pain management

## 2017-06-28 NOTE — Telephone Encounter (Signed)
Left message return call 06/28/17 

## 2017-06-30 NOTE — Progress Notes (Signed)
Noted  

## 2017-07-01 ENCOUNTER — Ambulatory Visit (HOSPITAL_COMMUNITY): Payer: Self-pay | Admitting: Psychiatry

## 2017-07-04 IMAGING — US US ABDOMEN LIMITED
1 series · 14 of 25 positions shown · non-contrast
Comparison: CT abdomen and pelvis 04/07/2015

CLINICAL DATA: Right upper quadrant abdominal pain for 2 months.
Elevated transaminase.

EXAM:
US ABDOMEN LIMITED - RIGHT UPPER QUADRANT

[Series 1: us abdomen limited · 0.23mm/px · 14 of 32 slices shown]
[im 1/32]
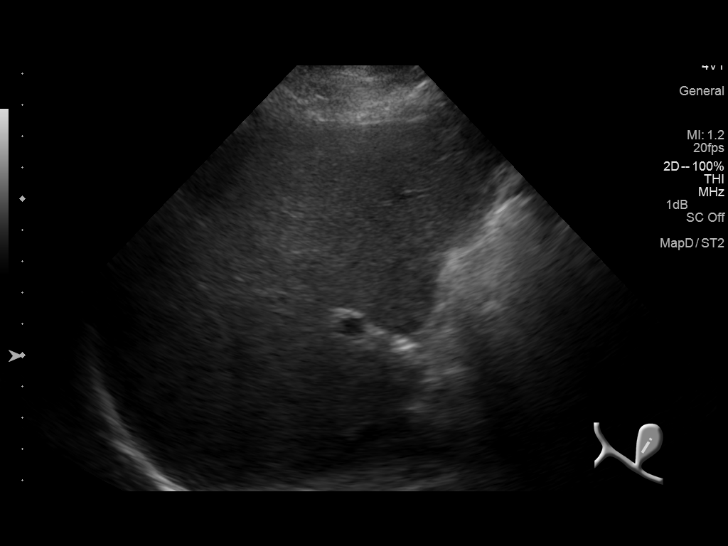
[im 3/32]
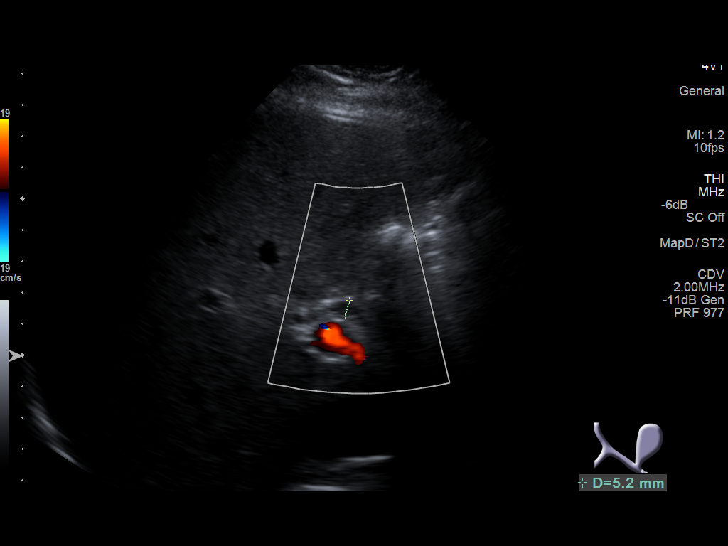
[im 6/32]
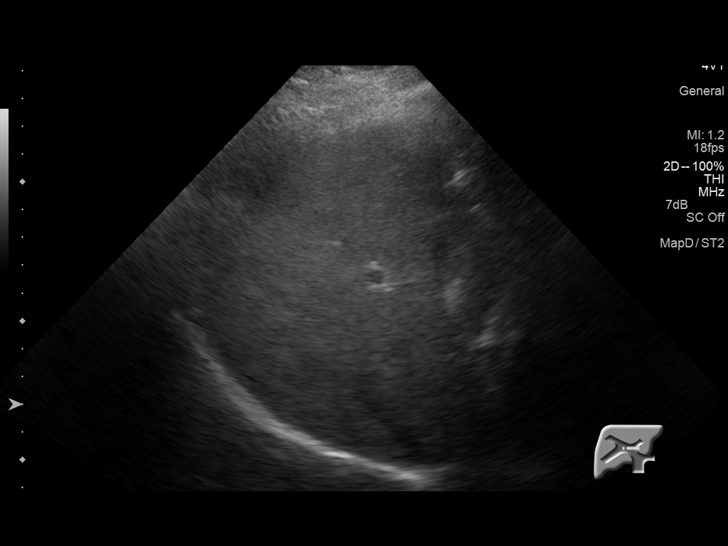
[im 8/32]
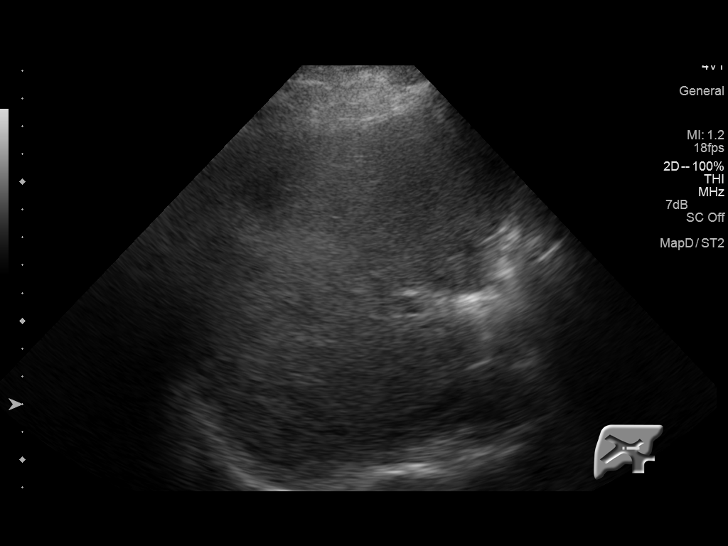
[im 11/32]
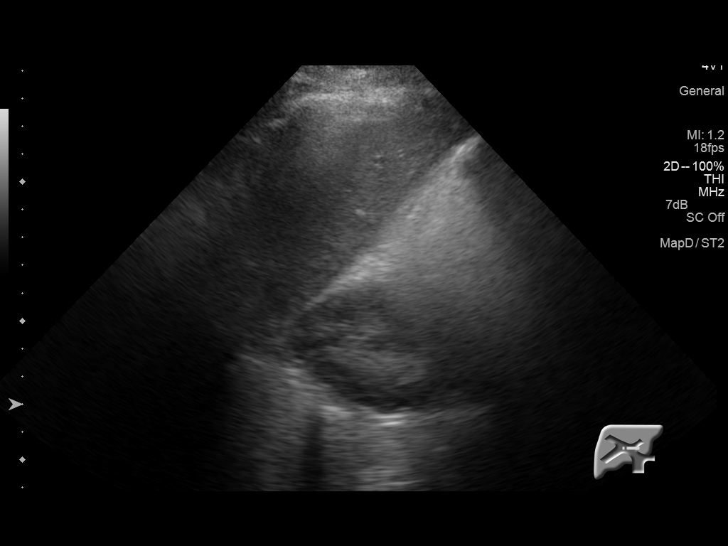
[im 12/32]
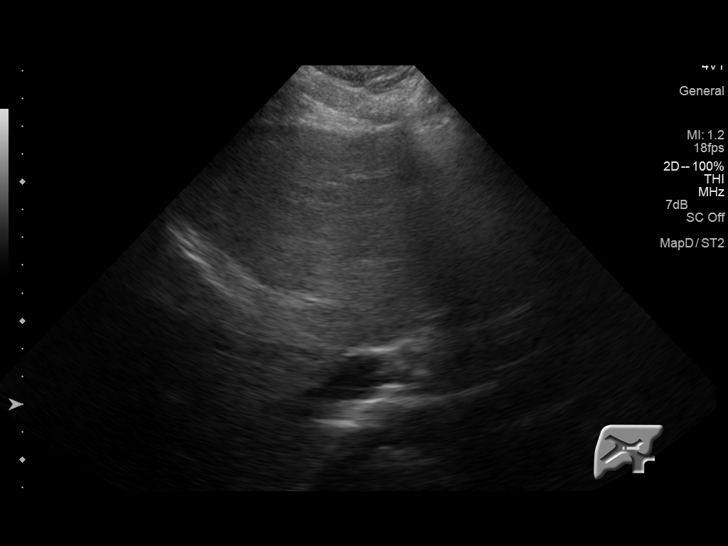
[im 15/32]
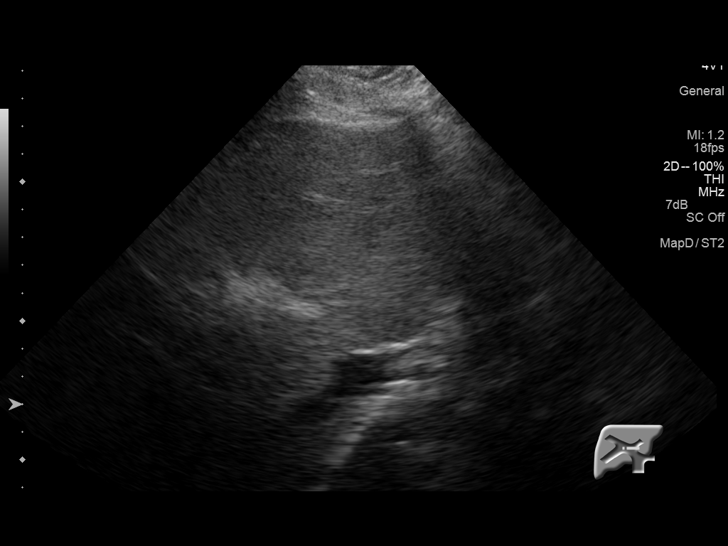
[im 17/32]
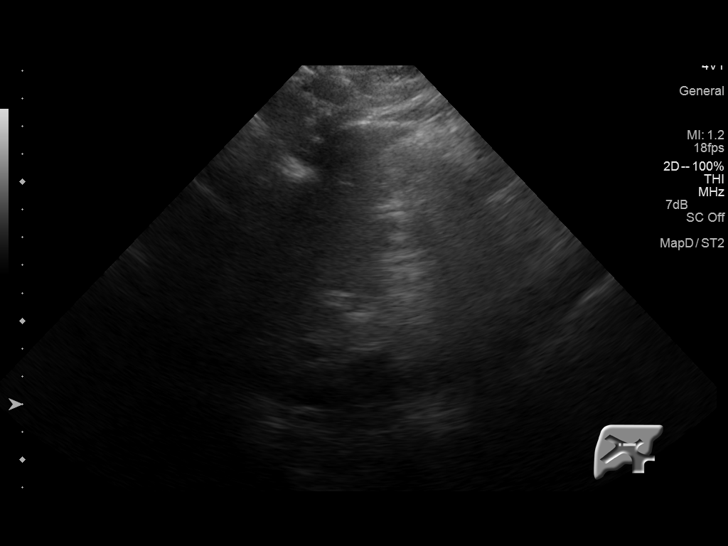
[im 20/32]
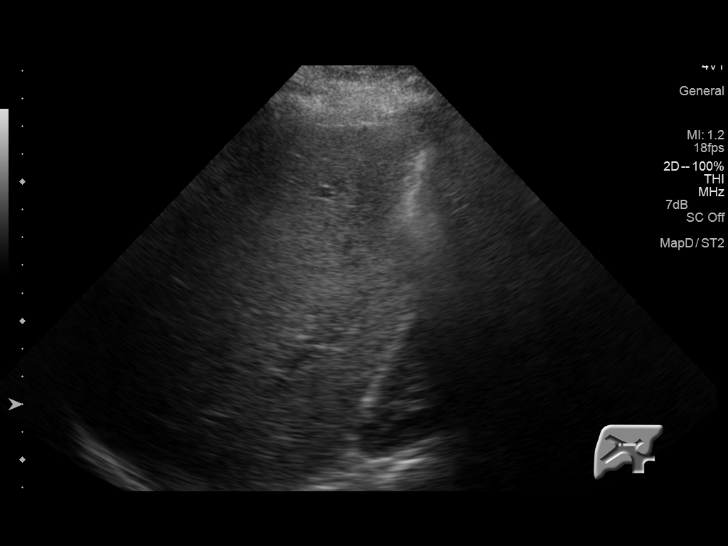
[im 21/32]
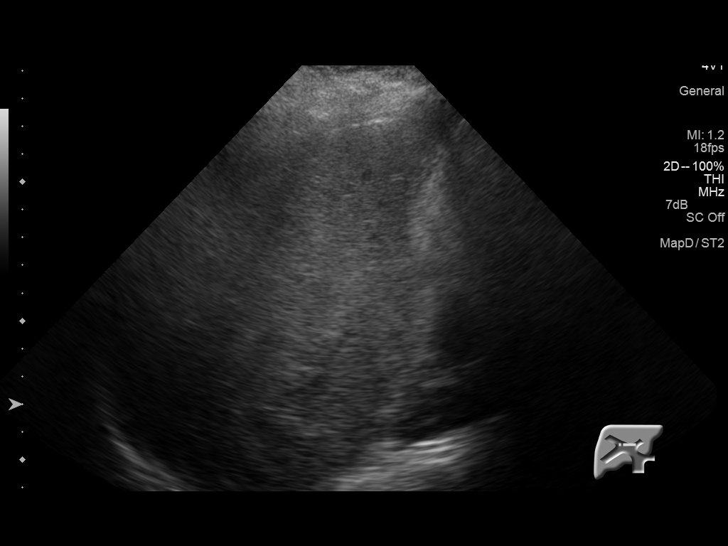
[im 24/32]
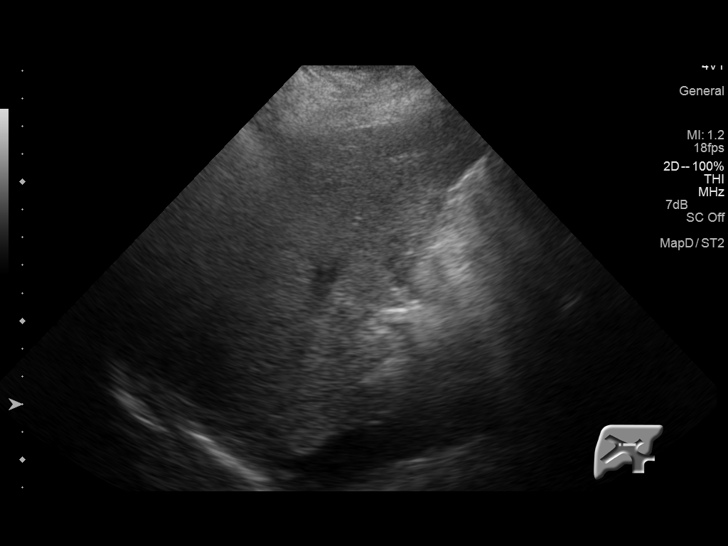
[im 26/32]
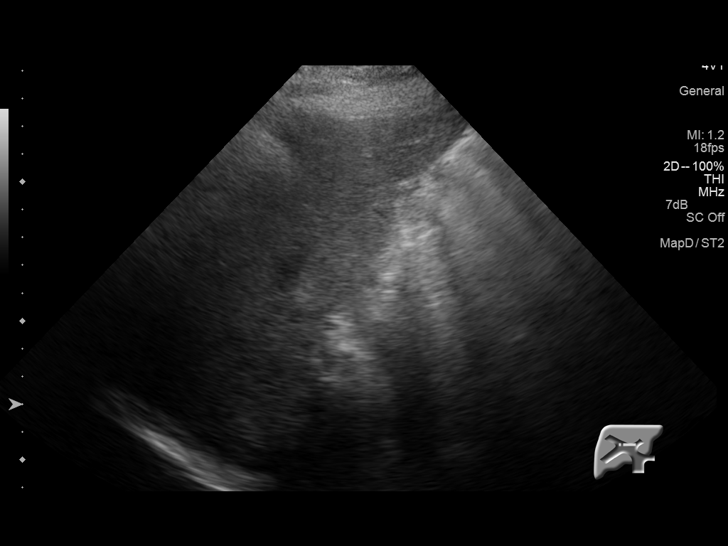
[im 29/32]
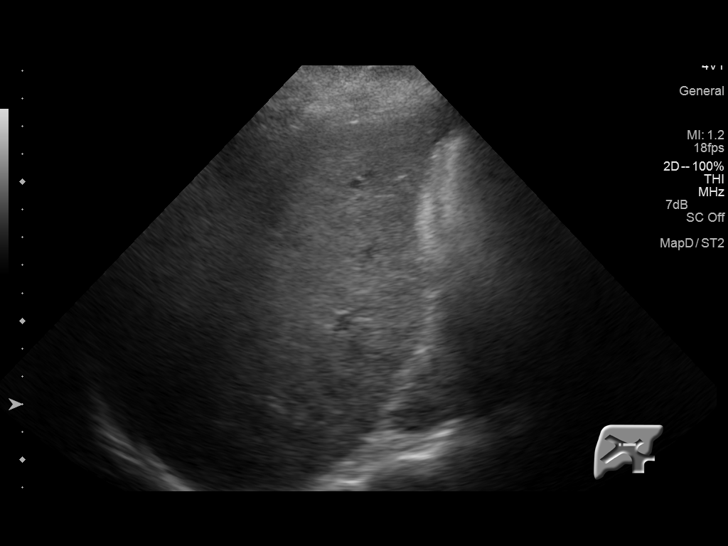
[im 32/32]
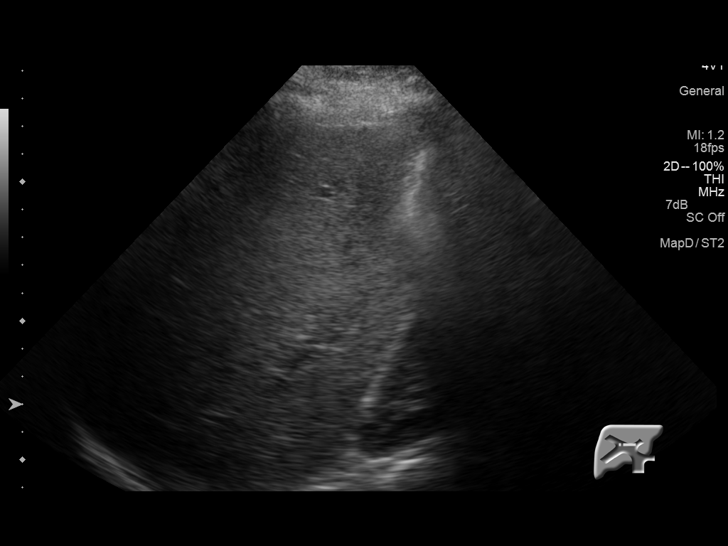

[14 of 25 positions shown; findings below may reference images not displayed]

FINDINGS: Examination was mildly limited by patient body habitus and
difficulty with breath holding.

Gallbladder:

Surgically absent. No abnormality identified in the gallbladder
fossa.

Common bile duct:

Diameter: 5 mm

Liver:

Diffusely increased parenchymal echogenicity without focal lesion
identified.
IMPRESSION: 1. Hyperechoic liver, suggestive of hepatic steatosis.
2. Prior cholecystectomy.  No biliary dilatation.

## 2017-07-06 DIAGNOSIS — R9431 Abnormal electrocardiogram [ECG] [EKG]: Secondary | ICD-10-CM | POA: Diagnosis not present

## 2017-07-06 DIAGNOSIS — R569 Unspecified convulsions: Secondary | ICD-10-CM | POA: Diagnosis not present

## 2017-07-06 NOTE — Telephone Encounter (Signed)
Referral to pain management ordered in EPIC.

## 2017-07-06 NOTE — Addendum Note (Signed)
Addended by: Dairl Ponder on: 07/06/2017 05:05 PM   Modules accepted: Orders

## 2017-07-07 DIAGNOSIS — F1220 Cannabis dependence, uncomplicated: Secondary | ICD-10-CM | POA: Insufficient documentation

## 2017-07-11 ENCOUNTER — Telehealth (HOSPITAL_COMMUNITY): Payer: Self-pay | Admitting: *Deleted

## 2017-07-11 NOTE — Telephone Encounter (Signed)
Pt called and lm asking for staff to call her regarding a medication question. Staff called pt and pt stated she was in the Hospital and they took her off [place] and put her on something that starts with an "I". Per pt she do not know the name. Per pt the [place] is not working. Per pt the medication was given to her in the hospital and would like to know if Dr. Harrington Challenger could prescribe that medication for her. Pt number is [phone]. Per pt Dr. Harrington Challenger tried to prescribed this medication that starts with an 'I' before for sleep. Informed pt that provider might need her to come into office for f/u and offered appt for this week and pt denied appts. Per pt she would just like staff to send message to provider. Per pt the medication that was given to her for sleep in the hospital worked and she just want provider to prescribe it to her.

## 2017-07-11 NOTE — Telephone Encounter (Signed)
Called pt and she stated it was [place]. Per pt she would like to know if Dr. Harrington Challenger could please send this medication into pharmacy. Pt number is [phone]

## 2017-07-11 NOTE — Telephone Encounter (Signed)
I can't find the med on her list, she would need to cal back with exact name. Lunesta perhaps?

## 2017-07-12 ENCOUNTER — Telehealth (HOSPITAL_COMMUNITY): Payer: Self-pay | Admitting: *Deleted

## 2017-07-12 MED ORDER — ESZOPICLONE 2 MG PO TABS: 2.0000 mg | ORAL_TABLET | Freq: Every evening | ORAL | 0 refills | Status: DC | PRN

## 2017-07-12 NOTE — Telephone Encounter (Signed)
Per Dr Harrington Challenger to call in Lunesta 2 mg QHS to pt pharmacy due to previous phone call. Called pt and informed her with what provider stated about medication and SIG. Per pt pharmacy and spoke with Nicki Reaper and informed him with information and he verbalized understanding.

## 2017-07-12 NOTE — Telephone Encounter (Signed)
Called pt to verify pharmacy before send out refills for her Lunesta. Staff was unable to reach pt and lmtcb and office number was provided on voicemail.

## 2017-07-12 NOTE — Telephone Encounter (Signed)
You will have to call it in.Penny [name] 2 mg, # 30, one at bedtime, no refills

## 2017-07-12 NOTE — Telephone Encounter (Signed)
Called in pt medication to pt pharmacy and spoke with Nicki Reaper the pharmacist and he verbalized understanding.

## 2017-07-13 ENCOUNTER — Telehealth (HOSPITAL_COMMUNITY): Payer: Self-pay | Admitting: *Deleted

## 2017-07-13 NOTE — Telephone Encounter (Signed)
Prior authorization for [facility] received. Filled out form from online [name] [facility] and faxed to office staff for MD signature. Office staff with fax to [place] tracks once signature obtained.

## 2017-07-14 NOTE — Telephone Encounter (Signed)
Called pt pharmacy and spoke with Lovena Le and he informed staff pt Penny [name] went through with her insurance. Per Lovena Le they will get pt medication ready for her to pick up. Called pt and informed her that her medication is ready for pick up. Per verbal from provider to sch f/u appt for pt to discuss forms that were received. Per pt she will have to call back to resch appt due to wanting her mom to be there.

## 2017-07-15 ENCOUNTER — Other Ambulatory Visit (HOSPITAL_COMMUNITY): Payer: Self-pay | Admitting: Psychiatry

## 2017-07-18 ENCOUNTER — Encounter: Payer: Self-pay | Admitting: Family Medicine

## 2017-07-18 NOTE — Telephone Encounter (Signed)
Form from [place] came to office for provider to sign and for staff to fax to [place] for pt to get her Lunesta approved. Staff faxed request to [facility] and pt is aware. Staff called pt pharmacy a day later to verify if insurance approved medication and they informed staff insurance approved mediation and they will get pt medication ready for pick up.

## 2017-07-21 ENCOUNTER — Ambulatory Visit (INDEPENDENT_AMBULATORY_CARE_PROVIDER_SITE_OTHER): Payer: Medicaid Other | Admitting: Psychiatry

## 2017-07-21 ENCOUNTER — Encounter (HOSPITAL_COMMUNITY): Payer: Self-pay | Admitting: Psychiatry

## 2017-07-21 VITALS — BP 125/80 | HR 90 | Ht 66.0 in | Wt 205.0 lb

## 2017-07-21 DIAGNOSIS — R5383 Other fatigue: Secondary | ICD-10-CM

## 2017-07-21 DIAGNOSIS — R634 Abnormal weight loss: Secondary | ICD-10-CM | POA: Diagnosis not present

## 2017-07-21 DIAGNOSIS — F332 Major depressive disorder, recurrent severe without psychotic features: Secondary | ICD-10-CM | POA: Diagnosis not present

## 2017-07-21 DIAGNOSIS — F17210 Nicotine dependence, cigarettes, uncomplicated: Secondary | ICD-10-CM

## 2017-07-21 DIAGNOSIS — Z560 Unemployment, unspecified: Secondary | ICD-10-CM

## 2017-07-21 DIAGNOSIS — R109 Unspecified abdominal pain: Secondary | ICD-10-CM | POA: Diagnosis not present

## 2017-07-21 DIAGNOSIS — R51 Headache: Secondary | ICD-10-CM

## 2017-07-21 DIAGNOSIS — Z818 Family history of other mental and behavioral disorders: Secondary | ICD-10-CM | POA: Diagnosis not present

## 2017-07-21 DIAGNOSIS — R5381 Other malaise: Secondary | ICD-10-CM

## 2017-07-21 MED ORDER — CARBAMAZEPINE 200 MG PO TABS: ORAL_TABLET | ORAL | 2 refills | Status: DC

## 2017-07-21 MED ORDER — AMPHETAMINE-DEXTROAMPHET ER 30 MG PO CP24: 30.0000 mg | ORAL_CAPSULE | ORAL | 0 refills | Status: DC

## 2017-07-21 MED ORDER — CLONAZEPAM 0.5 MG PO TABS: 0.5000 mg | ORAL_TABLET | Freq: Two times a day (BID) | ORAL | 0 refills | Status: DC

## 2017-07-21 MED ORDER — QUETIAPINE FUMARATE 50 MG PO TABS: 50.0000 mg | ORAL_TABLET | Freq: Every day | ORAL | 2 refills | Status: DC

## 2017-07-21 MED ORDER — DULOXETINE HCL 60 MG PO CPEP: 120.0000 mg | ORAL_CAPSULE | Freq: Every day | ORAL | 2 refills | Status: DC

## 2017-07-21 MED ORDER — TOPIRAMATE 50 MG PO TABS: ORAL_TABLET | ORAL | 2 refills | Status: DC
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
## 2017-07-22 ENCOUNTER — Ambulatory Visit (INDEPENDENT_AMBULATORY_CARE_PROVIDER_SITE_OTHER): Payer: Medicaid Other | Admitting: Psychiatry

## 2017-07-22 DIAGNOSIS — F332 Major depressive disorder, recurrent severe without psychotic features: Secondary | ICD-10-CM | POA: Diagnosis not present
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
## 2017-07-27 ENCOUNTER — Telehealth (HOSPITAL_COMMUNITY): Payer: Self-pay | Admitting: *Deleted

## 2017-07-27 NOTE — Telephone Encounter (Signed)
Prior authorization for Seroquel received. Called [place] [facility] spoke with Doroteo Bradford who gave approval #[id number] good until 07/22/18. Called to notify pharmacy.

## 2017-07-29 ENCOUNTER — Telehealth: Payer: Self-pay | Admitting: Adult Health

## 2017-07-29 NOTE — Telephone Encounter (Signed)
Spoke with pt. Pt had tubal and ablation in April. She has had spotting once a month since then. This month, spotting has been going on for 3 weeks. Also, having migraine headache and back pain. I spoke with JAG and she advised needs to schedule an appt with Dr. Glo Herring to discuss. Pt voiced understanding and call was transferred to front desk. [place]

## 2017-07-29 NOTE — Telephone Encounter (Signed)
noted 

## 2017-07-30 IMAGING — CT CT HEAD W/O CM
3 series · 15 of 47 positions shown, 18 images · non-contrast
Comparison: 11/15/2015 CT and 01/23/2016 MR

CLINICAL DATA: 30-year-old female with headache and probable
seizure.

EXAM:
CT HEAD WITHOUT CONTRAST
TECHNIQUE: Contiguous axial images were obtained from the base of the skull
through the vertex without intravenous contrast.

[Series 2: head trauma wo · axial · 0.41mm/px · z∈[+1504,+1634]mm · 9 of 32 slices shown, 12 images]
[im 3/32  brain]
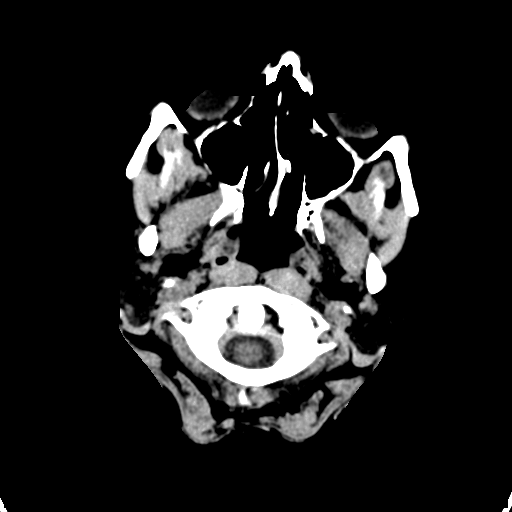
[im 3/32  bone]
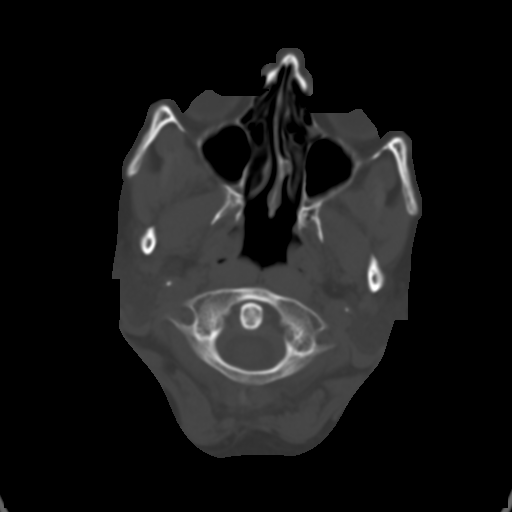
[im 6/32  brain]
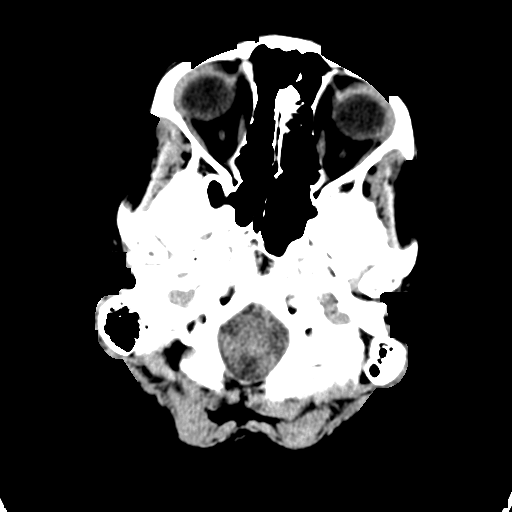
[im 9/32  brain]
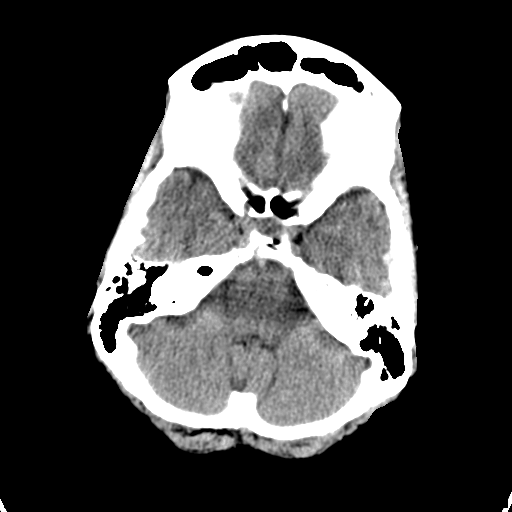
[im 12/32  brain]
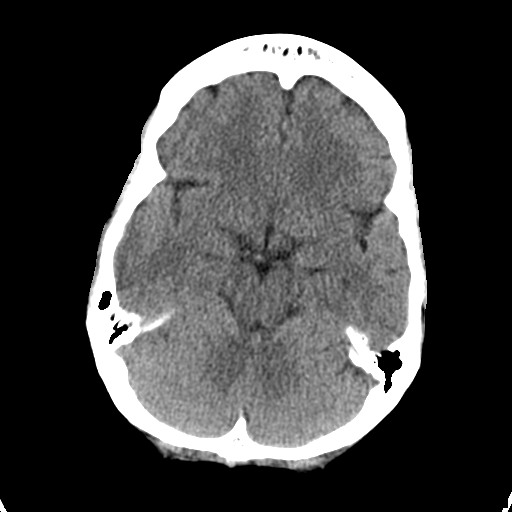
[im 17/32  brain]
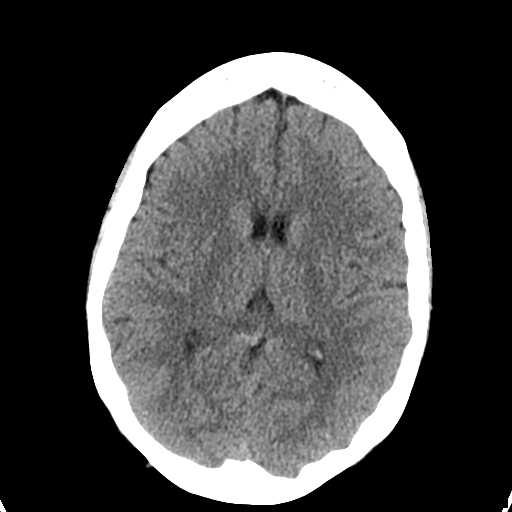
[im 17/32  bone]
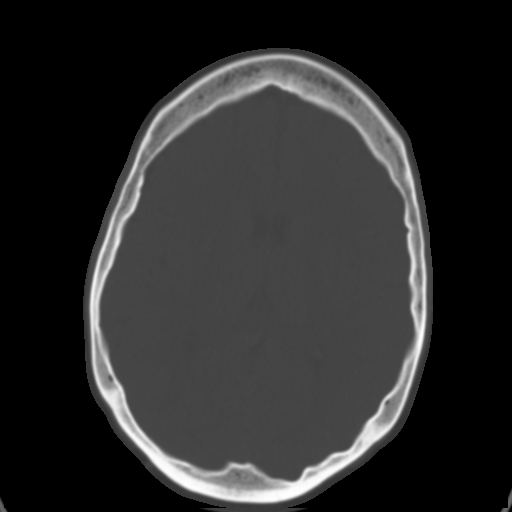
[im 20/32  brain]
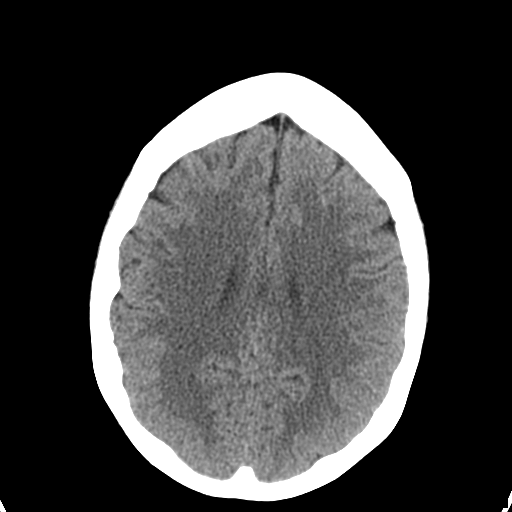
[im 23/32  brain]
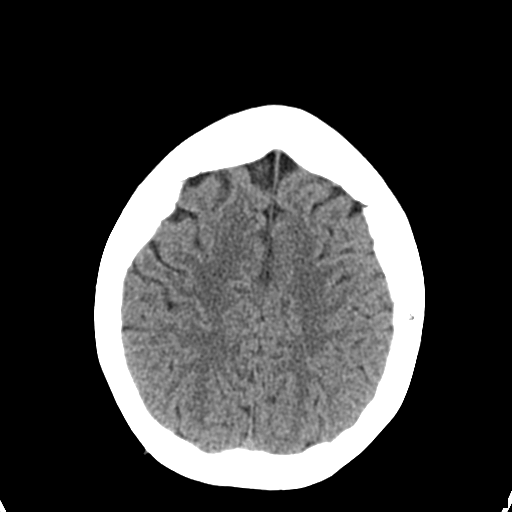
[im 26/32  brain]
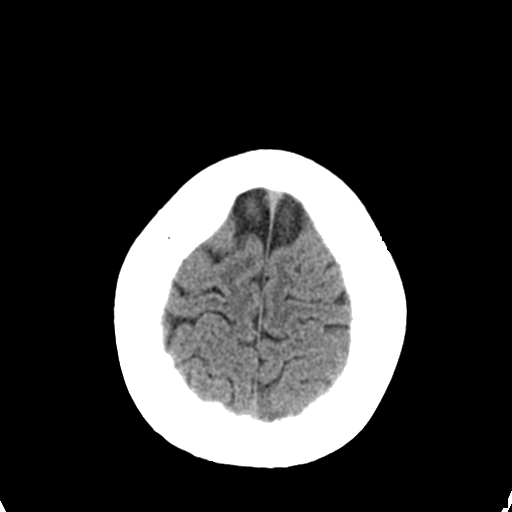
[im 29/32  brain]
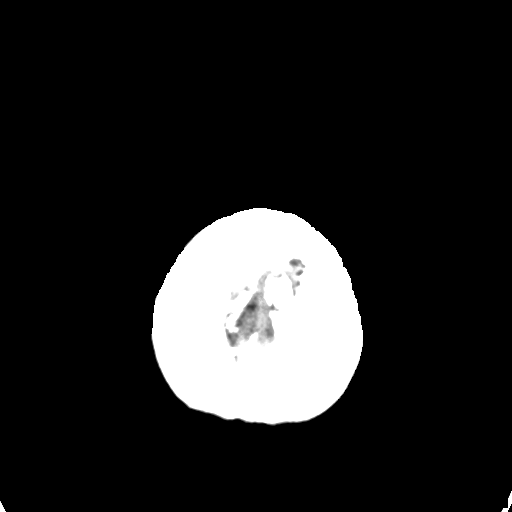
[im 29/32  bone]
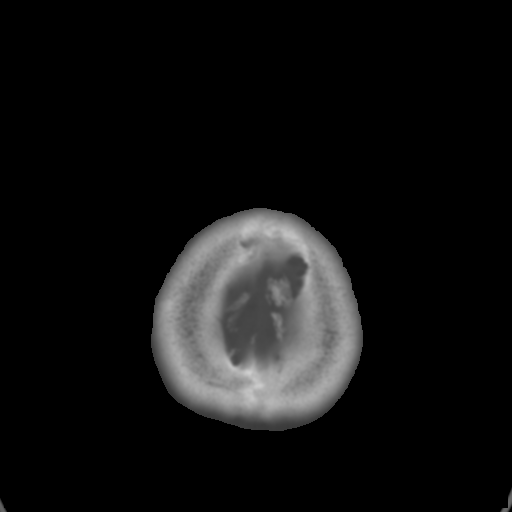

[Series 4: coronal soft tissue · coronal · 0.35mm/px · 3 of 68 slices shown]
[im 23/68  brain]
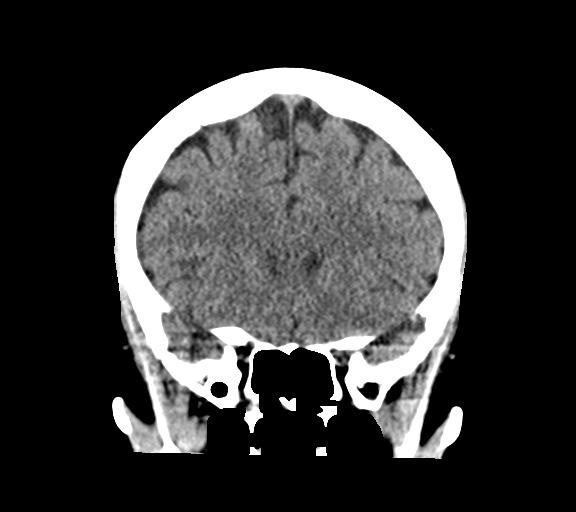
[im 30/68  brain]
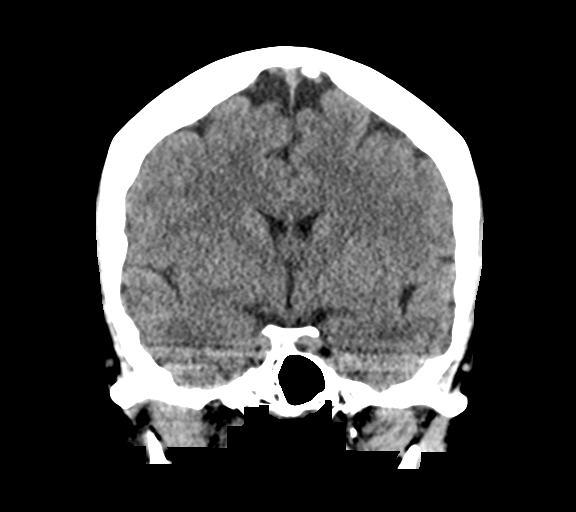
[im 38/68  brain]
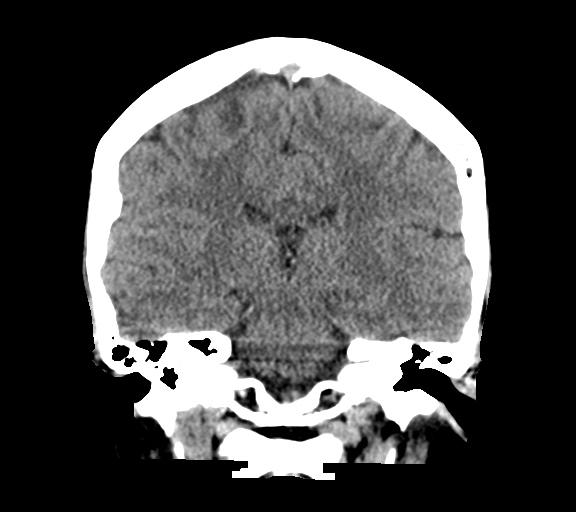

[Series 5: sagittal soft tissue · sagittal · 0.33mm/px · 3 of 52 slices shown]
[im 18/52  brain]
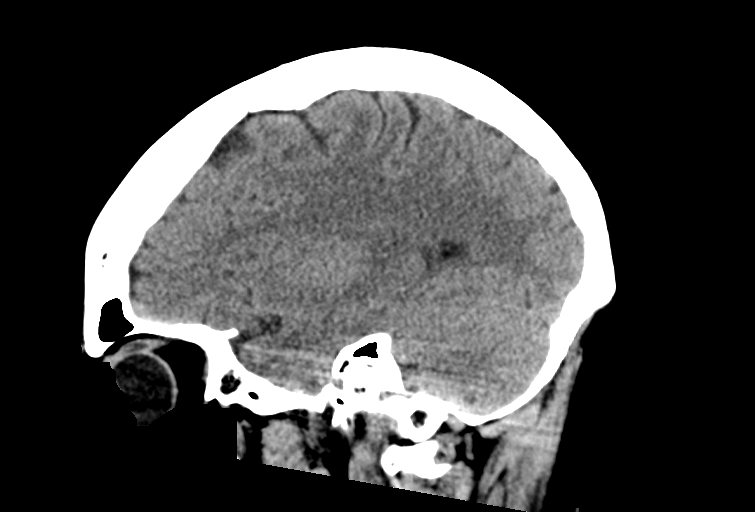
[im 26/52  brain]
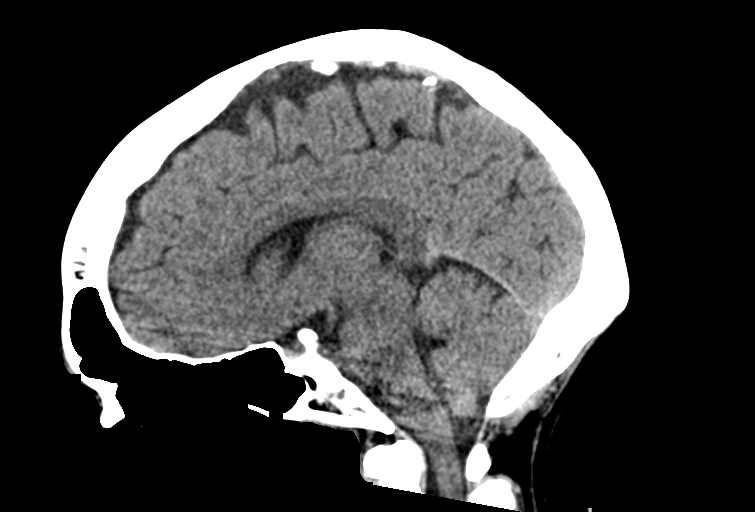
[im 35/52  brain]
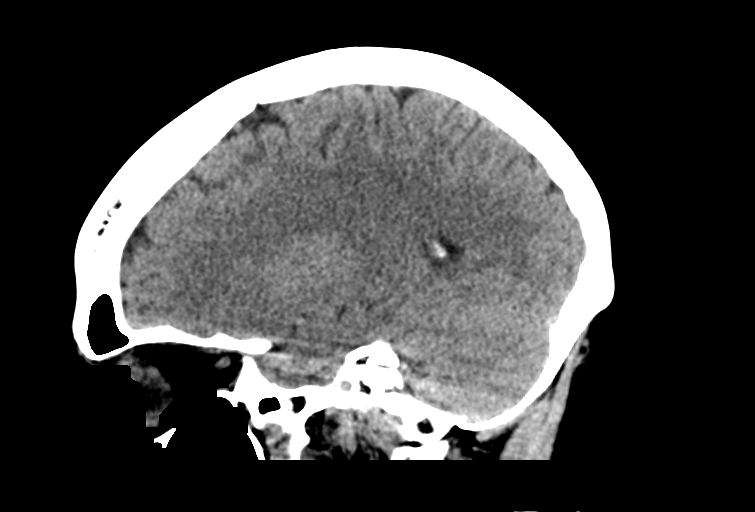

[15 of 47 positions shown; findings below may reference images not displayed]

FINDINGS: Brain: No evidence of infarction, hemorrhage, hydrocephalus,
extra-axial collection or mass lesion/mass effect.

Vascular: No hyperdense vessel or unexpected calcification.

Skull: Normal. Negative for fracture or focal lesion.

Sinuses/Orbits: No acute finding.

Other: None.
IMPRESSION: Unremarkable noncontrast head CT.

## 2017-08-01 IMAGING — MR MR LUMBAR SPINE W/O CM
6 of 11 series · 26 of 48 positions shown · non-contrast
Comparison: Prior MRI from 01/23/2016.

CLINICAL DATA: Initial evaluation for bilateral lower extremity
weakness with loss of sensation. Seizures.

EXAM:
MRI THORACIC AND LUMBAR SPINE WITHOUT CONTRAST
TECHNIQUE: Multiplanar and multiecho pulse sequences of the thoracic and lumbar
spine were obtained without intravenous contrast.

[Series 3: T2 · sagittal · 4.0mm · 0.55mm/px · 3 of 12 slices shown (1 of 4)]
[im 1/12]
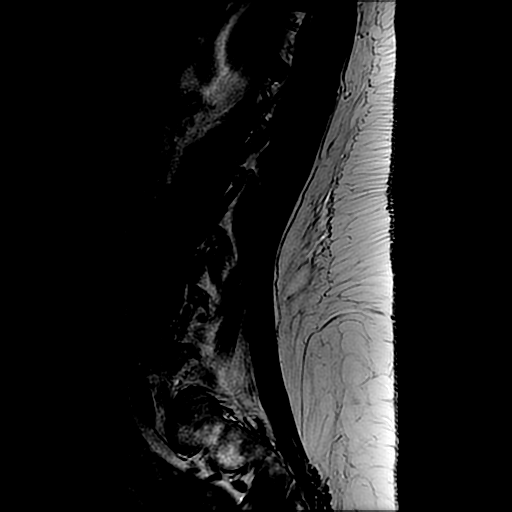
[im 6/12]
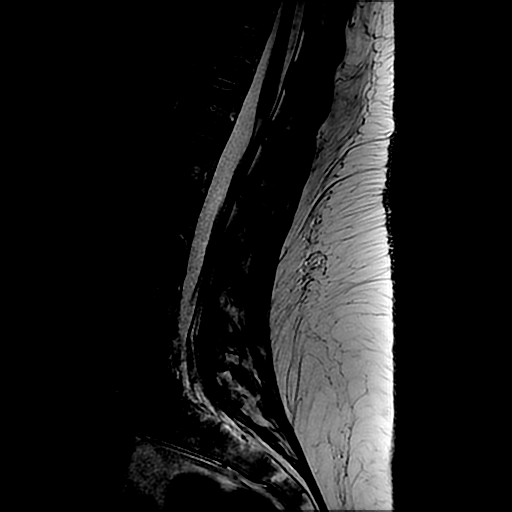
[im 12/12]
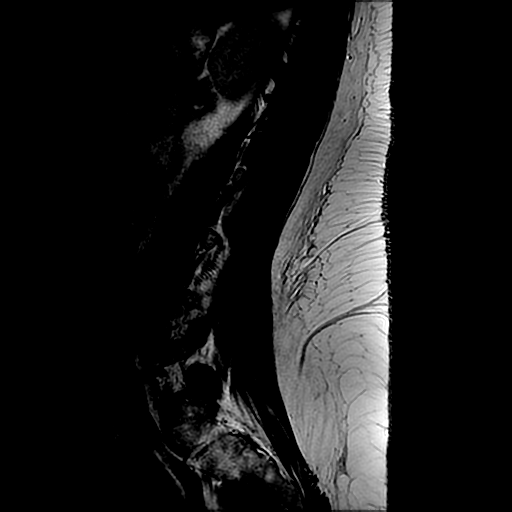

[Series 4: T1 · sagittal · 4.0mm · 0.55mm/px · 3 of 12 slices shown (1 of 2)]
[im 1/12]
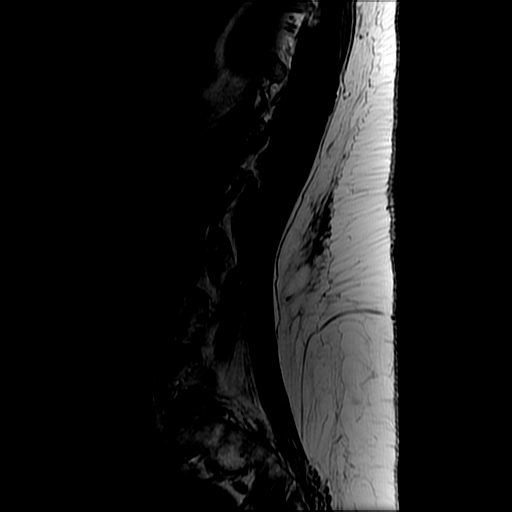
[im 6/12]
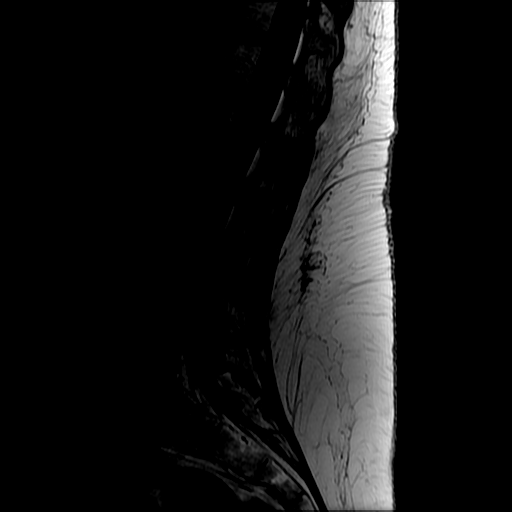
[im 12/12]
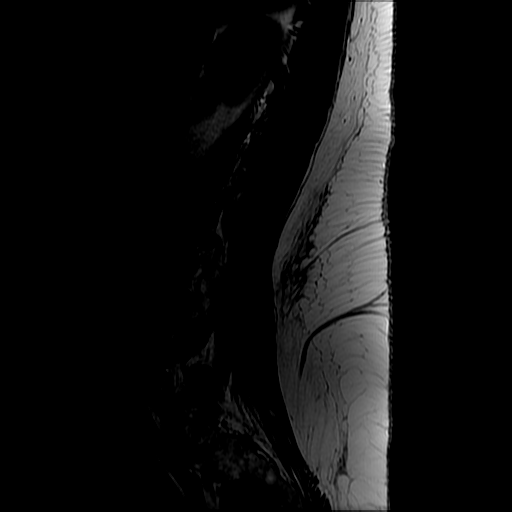

[Series 6: T2 · axial · 4.0mm · 0.39mm/px · z∈[-109,+77]mm · 6 of 34 slices shown (2 of 4)]
[im 1/34]
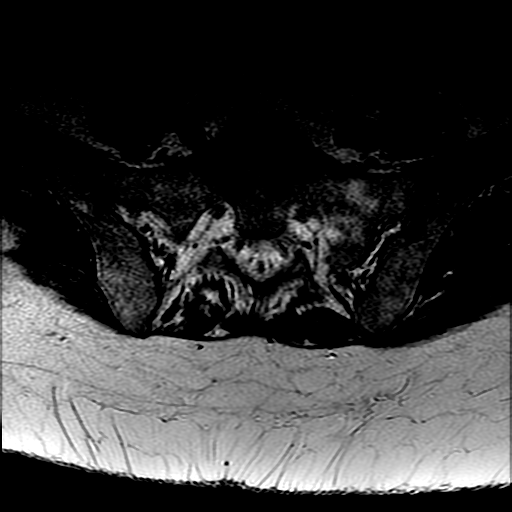
[im 7/34]
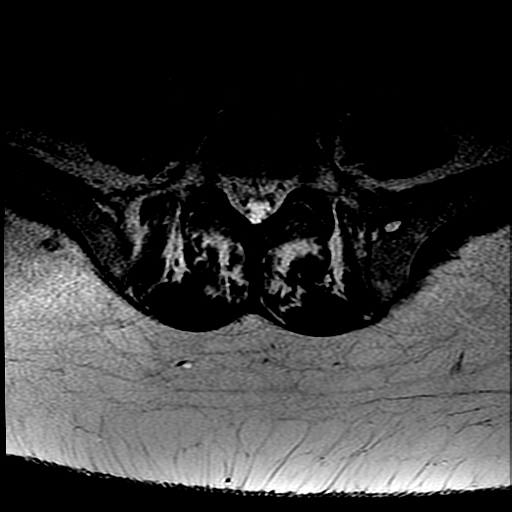
[im 14/34]
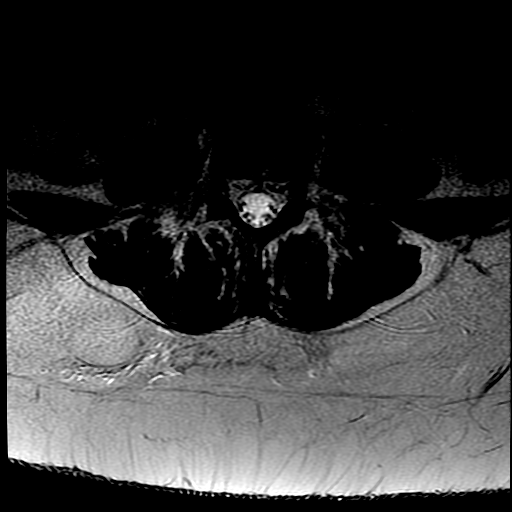
[im 20/34]
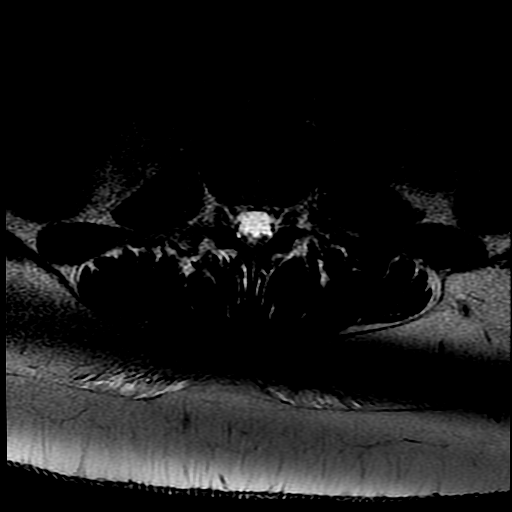
[im 27/34]
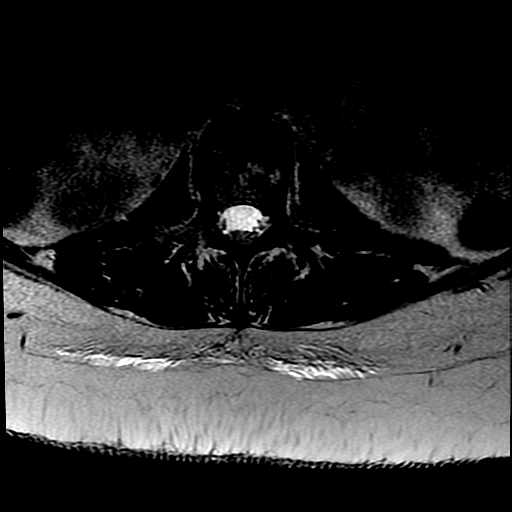
[im 34/34]
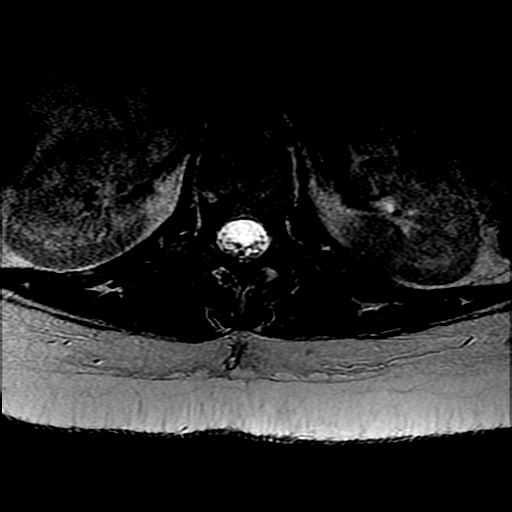

[Series 7: T1 · axial · 4.0mm · 0.78mm/px · z∈[-109,-14]mm · 4 of 34 slices shown (2 of 2)]
[im 1/34]
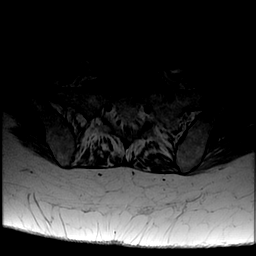
[im 7/34]
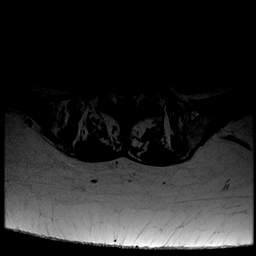
[im 14/34]
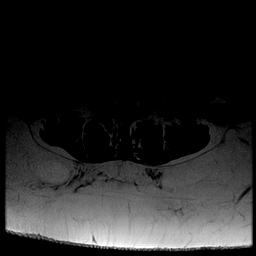
[im 20/34]
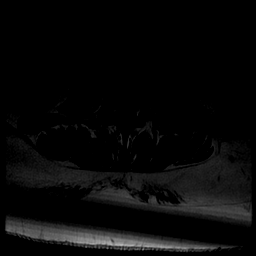

[Series 11: T2 · sagittal · 3.0mm · 0.62mm/px · 2 of 12 slices shown (3 of 4)]
[im 1/12]
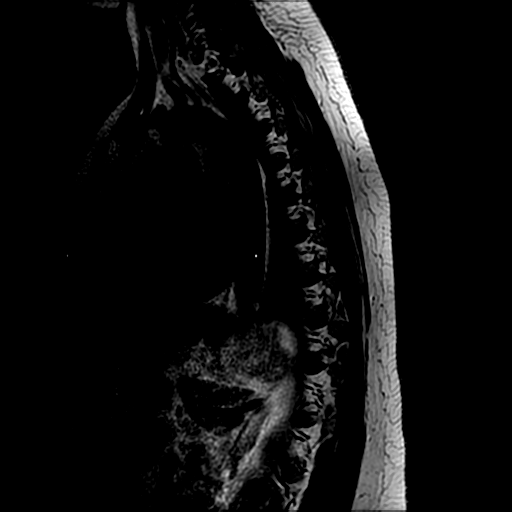
[im 12/12]
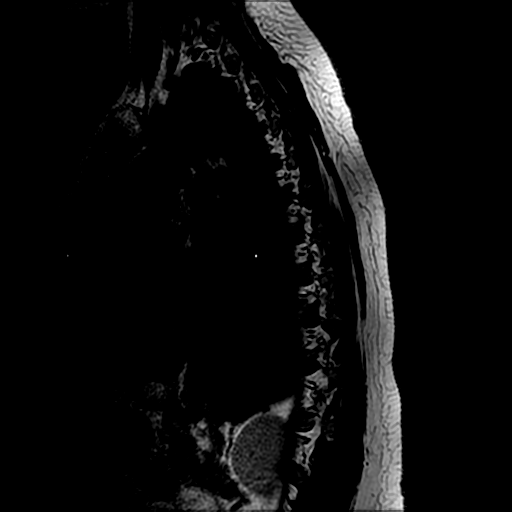

[Series 15: T2 · axial · 4.0mm · 0.86mm/px · z∈[+103,+358]mm · 8 of 54 slices shown (4 of 4)]
[im 1/54]
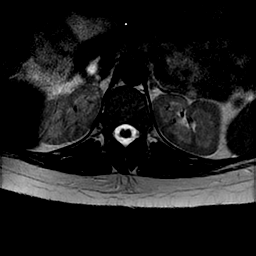
[im 6/54]
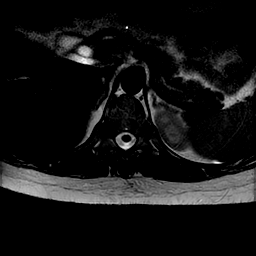
[im 18/54]
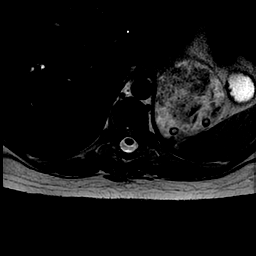
[im 24/54]
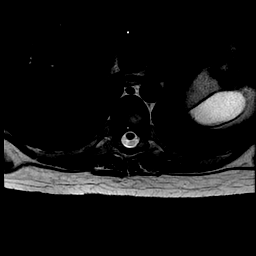
[im 30/54]
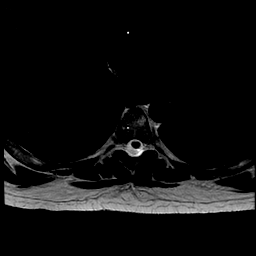
[im 36/54]
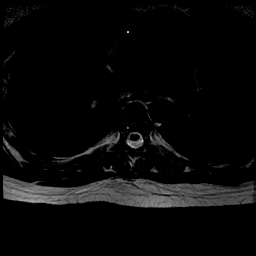
[im 48/54]
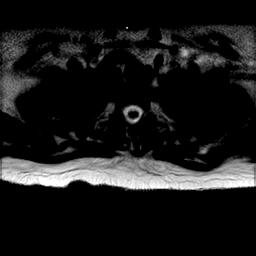
[im 54/54]
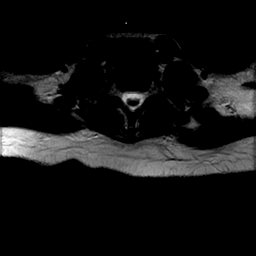

[26 of 48 positions shown; findings below may reference images not displayed]

FINDINGS: MRI THORACIC SPINE FINDINGS

Alignment: Vertebral bodies normally aligned with preservation of
the normal thoracic kyphosis. No listhesis.

Vertebrae: Mild chronic compression deformity involving the superior
endplate of T8 noted, stable from previous. Vertebral body heights
otherwise maintained. No other evidence for acute or interval
fracture. Benign hemangioma noted within the T6 vertebral body.
Signal intensity within the vertebral body bone marrow otherwise
within normal limits. No other discrete or worrisome osseous
lesions. No abnormal marrow edema.

Cord: Signal intensity within the thoracic spinal cord is normal.
Cord caliber within normal limits. Conus medullaris terminates below
the T12 level.

Paraspinal and other soft tissues: Paraspinous soft tissues within
normal limits. Partially visualized lungs are clear. Visualized
visceral structures within normal limits.

Disc levels:

Mild degenerative disc bulge noted at C5-6 and C6-7 on counter
sequence.

No significant degenerative disc pathology seen within the thoracic
spine. No disc bulge or disc protrusion. No significant facet
disease. No canal or foraminal stenosis.

MRI LUMBAR SPINE FINDINGS

Segmentation: Normal segmentation. Lowest well-formed disc labeled
the L5-S1 level.

Alignment: Vertebral bodies normally aligned with preservation of
the normal lumbar lordosis. No listhesis.

Vertebrae: Vertebral body heights are maintained. No evidence for
acute or chronic fracture. Few small benign hemangiomas noted.
Signal intensity within the vertebral body bone marrow within normal
limits. No worrisome osseous lesions.

Conus medullaris: Extends to the L1 level and appears normal.

Paraspinal and other soft tissues: Paraspinous soft tissues within
normal limits. Left adnexal cystic lesion likely reflects a
physiologic ovarian cyst. Visualized visceral structures otherwise
unremarkable.

Disc levels:

L1-2:  Unremarkable.

L2-3:  Unremarkable.

L3-4: Mild diffuse disc bulge with disc desiccation. Superimposed
small right subarticular disc extrusion with caudad angulation.
Associated annular fissure. No associated stenosis or neural
impingement. Foramina are widely patent.

L4-5: Mild disc bulge with disc desiccation. Shallow right
subarticular disc protrusion with associated caudad angulation.
Associated annular fissure. Protruding disc contacts the descending
right L5 nerve root in the right lateral recess without significant
displacement. Resultant mild to moderate right lateral recess
stenosis. Central canal remains widely patent. No foraminal
narrowing.

L5-S1:  Unremarkable.
IMPRESSION: MR THORACIC SPINE IMPRESSION

1. Mild chronic T8 compression deformity, stable from previous.
2. Otherwise unremarkable MRI of the thoracic spine.

MR LUMBAR SPINE IMPRESSION

1. Small disc extrusion at L3-4 with additional small disc
protrusion at L4-5 as above, unchanged from previous.
2. Otherwise unremarkable MRI of the lumbar spine.

## 2017-08-02 ENCOUNTER — Telehealth (HOSPITAL_COMMUNITY): Payer: Self-pay | Admitting: *Deleted

## 2017-08-02 NOTE — Telephone Encounter (Signed)
I wrote a letter that was to be sent to DSS

## 2017-08-02 NOTE — Telephone Encounter (Signed)
returned phone call to [facility] regarding appointment.  no answer, left voice message.

## 2017-08-02 NOTE — Telephone Encounter (Signed)
phone call from [facility], asked if Dr. Harrington Challenger has completed forms from [place].   she said Dr. Harrington Challenger knows what the forms are for.

## 2017-08-02 NOTE — Telephone Encounter (Signed)
Per front staff, phone call from [place], asked if Dr. Harrington Challenger has completed forms from [employer].   she said Dr. Harrington Challenger knows what the forms are for

## 2017-08-03 ENCOUNTER — Ambulatory Visit (INDEPENDENT_AMBULATORY_CARE_PROVIDER_SITE_OTHER): Payer: Medicaid Other | Admitting: Obstetrics and Gynecology

## 2017-08-03 ENCOUNTER — Encounter: Payer: Self-pay | Admitting: Obstetrics and Gynecology

## 2017-08-03 VITALS — BP 100/68 | HR 92 | Ht 66.0 in | Wt 207.0 lb

## 2017-08-03 DIAGNOSIS — N898 Other specified noninflammatory disorders of vagina: Secondary | ICD-10-CM

## 2017-08-03 LAB — POCT WET PREP (WET MOUNT): TRICHOMONAS WET PREP HPF POC: ABSENT

## 2017-08-03 MED ORDER — METRONIDAZOLE 0.75 % VA GEL: 1.0000 | Freq: Every day | VAGINAL | 1 refills | Status: DC

## 2017-08-03 NOTE — Progress Notes (Signed)
Patient ID: BOSTON CATARINO, female   DOB: Apr 21, 1986, 30 y.o.   MRN: [id number]    [place] [facility] Visit  '@DATE'$ @            Patient name: Penny [name] MRN [id number]  Date of birth: 07/27/1986  CC & HPI:  JOURDAN MALDONADO is a 31 y.o. female presenting today for  1:continued spotting. Pt had tubal and ablation in April 2018 and reports still having monthly spotting. She notes this month she bleed as if she had a period. Pt notes her periods would last 4-5 days at a time, with the first day bleeding the heaviest and gradually improving 2. Pt endorses vaginal discharge and vaginal odor. The odor is worse when ovulating. Her discharge is reported to be brown and notes she can't go without wearing a panty liner. She is currently not sexually active. 3. Pt also reports back pain around the epidural site a few days before ovulating with associated HA, nausea, vomiting, and photophobia. Pt states she can't take birth control pills because the hormones make her "evil."and family wants to kill her."  ROS:  ROS +vaginal spotting/ bleeding +vaginal odor +vaginal discharge (brown) +back pain +HA +nausea +vomiting +photophobia  All systems are negative except as noted in the HPI and PMH.   Pertinent History Reviewed:   Reviewed: Significant for ovarian cyst Medical         Past Medical History:  Diagnosis Date  . ADD (attention deficit disorder)   . Anemia 2011   2o to GASTRIC BYPASS  . Anginal pain ([place])   . Anxiety   . Blood transfusion without reported diagnosis   . Chronic daily headache   . Depression   . Depression   . Dysmenorrhea 09/18/2013  . Dysrhythmia   . Elevated liver enzymes JUL 2011ALK PHOS 111-127 AST  143-267 ALT  213-321T BILI 0.6  ALB  3.7-4.06 Jun 2011 ALK PHOS 118 AST 24 ALT 42 T BILI 0.4 ALB 3.9  . Encounter for drug rehabilitation    at behavioral health for opioid addiction about 4 years ago  . Family history of adverse reaction to anesthesia    'dad  had to be kept on pump for breathing for morphine'  . Fatty liver   . Fibroid 01/18/2017  . Fibromyalgia   . Gastric bypass status for obesity   . Gastritis JULY 2011  . Heart murmur   . 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 History of Holter monitoring   . Hx of opioid abuse    for about 4 years, about 4 years ago  . Interstitial cystitis   . Iron deficiency anemia 07/23/2010  . Irritable bowel syndrome 2012 DIARRHEA   JUN 2012 TTG IgA 14.9  . IUD FEB 2010  . Lupus   . Menorrhagia 07/18/2013  . Migraines   . Obesity (BMI 30-39.9) 2011 228 LBS BMI 36.8  . Ovarian cyst   . Patient desires pregnancy 09/18/2013  . Polyneuropathy   . PONV (postoperative nausea and vomiting) seizure post-operatively  . Potassium (K) deficiency   . Pregnant 12/25/2013  . Psychiatric pseudoseizure   . RLQ abdominal pain 07/18/2013  . Sciatica of left side 11/14/2014  . 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 Stress 09/18/2013                              Surgical Hx:  Past Surgical History:  Procedure Laterality Date  . BIOPSY  04/10/2015   Procedure: BIOPSY;  Surgeon: Corbin Ade, MD;  Location: [facility];  Service: Endoscopy;;  . cath self every nite     for sodium bicarb injection (discontinued 2013)  . CHOLECYSTECTOMY  2005   biliary dyskinesia  . COLONOSCOPY  JUN 2012 ABD PN/DIARRHEA WITH PROPOFOL   NL COLON  . DILATION AND CURETTAGE OF UTERUS    . DILITATION & CURRETTAGE/HYSTROSCOPY WITH NOVASURE ABLATION N/A 03/24/2017   Procedure: DILATATION & CURETTAGE/HYSTEROSCOPY WITH NOVASURE ENDOMETRIAL ABLATION;  Surgeon: Tilda Burrow, MD;  Location: [facility];  Service: Gynecology;  Laterality: N/A;  . ESOPHAGEAL DILATION N/A 04/10/2015   Procedure: ESOPHAGEAL DILATION WITH 54FR MALONEY DILATOR;  Surgeon: Corbin Ade, MD;  Location: [facility];  Service: Endoscopy;  Laterality: N/A;  . ESOPHAGOGASTRODUODENOSCOPY    . ESOPHAGOGASTRODUODENOSCOPY (EGD) WITH PROPOFOL N/A  04/10/2015   Procedure: ESOPHAGOGASTRODUODENOSCOPY (EGD) WITH PROPOFOL;  Surgeon: Corbin Ade, MD;  Location: [facility];  Service: Endoscopy;  Laterality: N/A;  . ESOPHAGOGASTRODUODENOSCOPY (EGD) WITH PROPOFOL N/A 12/06/2016   Procedure: ESOPHAGOGASTRODUODENOSCOPY (EGD) WITH PROPOFOL;  Surgeon: West Bali, MD;  Location: [facility];  Service: Endoscopy;  Laterality: N/A;  . GAB  2007   in [place]-POUCH 5 CM  . GASTRIC BYPASS  06/2006  . HYSTEROSCOPY W/D&C N/A 09/12/2014   Procedure: DILATATION AND CURETTAGE /HYSTEROSCOPY;  Surgeon: Tilda Burrow, MD;  Location: [facility];  Service: Gynecology;  Laterality: N/A;  . LAPAROSCOPIC TUBAL LIGATION Bilateral 03/24/2017   Procedure: LAPAROSCOPIC TUBAL LIGATION ([place] Rings);  Surgeon: Tilda Burrow, MD;  Location: [facility];  Service: Gynecology;  Laterality: Bilateral;  . REPAIR VAGINAL CUFF N/A 07/30/2014   Procedure: REPAIR VAGINAL CUFF;  Surgeon: Catalina Antigua, MD;  Location: [facility];  Service: Gynecology;  Laterality: N/A;  . SAVORY DILATION  06/20/2012   Dr. Cyndi Bender gastritis/Ulcer in the mid jejunum. Empiric dilation.   . SURAL NERVE BX Left 02/25/2016   Procedure: LEFT SURAL NERVE BIOPSY;  Surgeon: Shirlean Kelly, MD;  Location: [facility];  Service: Neurosurgery;  Laterality: Left;  Left sural nerve biopsy  . TONSILLECTOMY    . TONSILLECTOMY AND ADENOIDECTOMY    . UPPER GASTROINTESTINAL ENDOSCOPY  JULY 2011 NAUSEA-D125,V6, PH 25   Bx; GASTRITIS, POUCH-5 CM LONG  . WISDOM TOOTH EXTRACTION     Medications: Reviewed & Updated - see associated section                       Current Outpatient Prescriptions:  .  amphetamine-dextroamphetamine (ADDERALL XR) 30 MG 24 hr capsule, Take 1 capsule (30 mg total) by mouth every morning., Disp: 30 capsule, Rfl: 0 .  carbamazepine (TEGRETOL) 200 MG tablet, Take two tablets at bedtime, Disp: 60 tablet, Rfl: 2 .  clonazePAM (KLONOPIN) 0.5 MG tablet, Take 1 tablet (0.5 mg total) by mouth 2 (two)  times daily., Disp: 60 tablet, Rfl: 0 .  DULoxetine (CYMBALTA) 60 MG capsule, Take 2 capsules (120 mg total) by mouth at bedtime., Disp: 60 capsule, Rfl: 2 .  HYDROcodone Bitartrate ER (HYSINGLA ER) 20 MG T24A, Take 20 mg by mouth., Disp: , Rfl:  .  Meth-Hyo-M Bl-Na Phos-Ph Sal (URIBEL) 118 MG CAPS, Take 1 tid, Disp: 20 capsule, Rfl: 1 .  Pediatric Multivitamins-Iron (FLINTSTONES PLUS IRON PO), Take 1 tablet by mouth 2 (two) times daily. , Disp: , Rfl:  .  phenazopyridine (PYRIDIUM) 100 MG tablet,  TAKE (1) TABLET BY MOUTH (3) TIMES A DAY AS NEEDED FOR PAIN., Disp: 21 tablet, Rfl: 0 .  PROAIR HFA 108 (90 Base) MCG/ACT inhaler, INHALE 2 PUFFS INTO THE LUNGS EVERY 6 HOURS AS NEEDED., Disp: 8.5 g, Rfl: 0 .  promethazine (PHENERGAN) 25 MG tablet, Take 1 tablet (25 mg total) by mouth every 6 (six) hours as needed., Disp: 30 tablet, Rfl: 0 .  QUEtiapine (SEROQUEL) 50 MG tablet, Take 1 tablet (50 mg total) by mouth at bedtime., Disp: 30 tablet, Rfl: 2 .  tiZANidine (ZANAFLEX) 4 MG tablet, Take 1 tablet (4 mg total) by mouth every 8 (eight) hours as needed for muscle spasms., Disp: 45 tablet, Rfl: 0 .  topiramate (TOPAMAX) 50 MG tablet, Taking 1 in QAm and 2 QHS, Disp: 90 tablet, Rfl: 2   Social History: Reviewed -  reports that she has been smoking Cigarettes.  She has a 1.50 pack-year smoking history. She has never used smokeless tobacco.  Objective Findings:  Vitals: Blood pressure 100/68, pulse 92, height '5\' 6"'$  (1.676 m), weight 207 lb (93.9 kg).  Physical Examination: General appearance - alert, well appearing, and in no distress and oriented to person, place, and time Pelvic - normal external genitalia, vulva, vagina, cervix, uterus and adnexa,  VULVA: atrophic changes noted, probably early vulvar dystrophy to vagina VAGINA: normal discharge, no noticed mal odor,  WET MOUNT done - results: + clue cells, negative red cells, few white cells   Assessment & Plan:   A:  1. BV 2. No evidence  blood per vagina 3. Unable to confirm complaints of blood per vagina 4  ? Menstrual migraine  P:  1. Pt is to use Metrogel for 3 months, 1-2 times per month after menstrual period.  2. Pt is to monitor her sx by use of menstrual calendar and HA calendar   By signing my name below, I, Margit Banda, attest that this documentation has been prepared under the direction and in the presence of Jonnie Kind, MD. Electronically Signed: Margit Banda, Medical Scribe. 08/03/17. 1:46 PM.  I personally performed the services described in this documentation, which was SCRIBED in my presence. The recorded information has been reviewed and considered accurate. It has been edited as necessary during review. Jonnie Kind, MD

## 2017-08-03 NOTE — Telephone Encounter (Signed)
Called pt to stop by office to fill out release of information form per previous phone call. Staff unable to reach pt and lmtcb and office number was provided on voicemail and office hour was provided as well.

## 2017-08-03 NOTE — Telephone Encounter (Signed)
Provider reprinted letter and signed letter again. Called pt to get name of person letter was being sent to. Per pt it is being sent to Penny [name] and or manager Penny [name]. Per pt their phone number is [phone] ext [phone] for Penny [name] and [phone] for Penny [name]. Per pt she do not know their fax number. Staff both number on 08-02-2017 and lmtcb on both ladies voicemail. Staff called again this morning to 08-03-2017 and spoke with Penny [name] who stated she called but there was too many option to pick from with the greeting so she hung up. Staff asked Penny. [name] for her fax number which is [phone]. Called pt to inform her that fax number was received and that pt had to stop by office to fill out release for Penny [name] and or Penny [name] before letter can be faxed to them. Staff was unable to reach pt and lmtcb on pt voicemail and office number was provided.

## 2017-08-04 ENCOUNTER — Telehealth (HOSPITAL_COMMUNITY): Payer: Self-pay | Admitting: *Deleted

## 2017-08-04 ENCOUNTER — Encounter (HOSPITAL_COMMUNITY): Payer: Self-pay | Admitting: Psychiatry

## 2017-08-04 ENCOUNTER — Ambulatory Visit (INDEPENDENT_AMBULATORY_CARE_PROVIDER_SITE_OTHER): Payer: Medicaid Other | Admitting: Psychiatry

## 2017-08-04 DIAGNOSIS — F332 Major depressive disorder, recurrent severe without psychotic features: Secondary | ICD-10-CM

## 2017-08-04 NOTE — Progress Notes (Signed)
THERAPIST PROGRESS NOTE  Session Time:   Thursday 08/04/2017 1:10 PM -  2:00 PM   Participation Level: Active  Behavioral Response: CasualAlertAnxious and Depressed/tearful  Type of Therapy: Individual Therapy         Treatment Goals: 1. Implement coping and behavioral strategies to overcome depression.    2. Implement calming and coping techniques to decrease intensity and frequency of anxiety response  Treatment Goals addressed:  1,  Interventions: CBT and Supportive  Summary: Penny [name] is a 31 y.o. female who initially was referred for services by psychiatrist Dr. Harrington Challenger in 2016  to improve coping skills. Patient was treated inpatient at [facility] in [place] in July 2016 for depression. She reports suffering for depression since she delivered her now six-year-old son and suffered "baby blues". Patient reports becoming depressed again when she was 7 months pregnant with her second child as she was put on complete bedrest. Also during this time, her parents separated after 39 years of marriage. Patient reports her health worsened overall and she began having seizures the day before delivering her second child in August 2015. She says this pregnancy was very stressful on her marriage and having more stress when baby was born prematurely and had to stay in NICU. 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  She reports stress related to poor relationship with father due to issues with his girlfriend.states her dad's girlfriend hates her and tries to keep dad out of patient's life. 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    Patient last was seen about 2-3 weeks  ago.She reports increased symptoms of depression and anxiety since last session. Stressors include the ongoing DSS investigation and patient's 68-year-old son recently informing her he no longer wants to live with her but wants to live with his dad. She expresses deep sadness and confusion regarding this. She suspects he wants to live with his father and stepmother full time as father allows him to spend excessive amounts of time on video games and patient doesn't per her report. Her son has been living full-time with his father for the past 2 weeks. She also expresses frustration and resentment regarding her mother as mother is in control of patient's medication per DSS requirement and will not give patient pain medication or Klonopin as prescribed by patient's doctors. Patient reports experiencing increased panic attacks and increased pain as a result of not having her medication. She reports trying to discuss her concerns with her mother but reports mother is very controlling. Patient reports poor concentration, fatigue, low self-esteem, feelings of hopelessness, and poor motivation.  Suicidal/Homicidal: No  Therapist Response: Reviewed symptoms,facilitated patient's expression of thoughts and feelings regarding stressors, rreviewed early warning signs of depression and coping strategies (behavioral activation) to intervene to avoid relapse, examined patient's pattern of communication in her relationship with her mother, began to discuss ways to improve communication skills including empathic listening skills, discussed possibility of including mother and next session to facilitate improved communication between patient and mother  Plan: Return again in 2 weeks. Therapist and patient agreed to include patient's mother in next session.  Diagnosis: Axis I: Major depressive disorder, recurrent    Axis II: Deferred    Dakota Stangl, LCSW 08/04/2017

## 2017-08-04 NOTE — Telephone Encounter (Signed)
Pt came into office and filled out a release of information per previous note in EP[employer]. Letter was faxed to location pt wanted letter to go to and pt visually saw staff fax it. Pt then requested a copy for the letter for her records. Copy was provided to pt. Staff sent information to scan center.

## 2017-08-09 NOTE — Telephone Encounter (Signed)
Pt came due office to see other provider. Before pt left office, pt signed release and information was sent to location pt wanted it sent to. Information was also put in outgoing scan folder for information to be scanned in pt chart.

## 2017-08-17 ENCOUNTER — Ambulatory Visit (HOSPITAL_COMMUNITY): Payer: Self-pay | Admitting: Psychiatry

## 2017-08-18 ENCOUNTER — Ambulatory Visit (INDEPENDENT_AMBULATORY_CARE_PROVIDER_SITE_OTHER): Payer: Medicaid Other | Admitting: Psychiatry

## 2017-08-18 ENCOUNTER — Encounter (HOSPITAL_COMMUNITY): Payer: Self-pay | Admitting: Psychiatry

## 2017-08-18 VITALS — BP 108/76 | HR 96 | Ht 66.0 in | Wt 209.0 lb

## 2017-08-18 DIAGNOSIS — R112 Nausea with vomiting, unspecified: Secondary | ICD-10-CM

## 2017-08-18 DIAGNOSIS — R450 Nervousness: Secondary | ICD-10-CM

## 2017-08-18 DIAGNOSIS — R5383 Other fatigue: Secondary | ICD-10-CM

## 2017-08-18 DIAGNOSIS — R109 Unspecified abdominal pain: Secondary | ICD-10-CM

## 2017-08-18 DIAGNOSIS — Z560 Unemployment, unspecified: Secondary | ICD-10-CM

## 2017-08-18 DIAGNOSIS — F17210 Nicotine dependence, cigarettes, uncomplicated: Secondary | ICD-10-CM | POA: Diagnosis not present

## 2017-08-18 DIAGNOSIS — F419 Anxiety disorder, unspecified: Secondary | ICD-10-CM | POA: Diagnosis not present

## 2017-08-18 DIAGNOSIS — F332 Major depressive disorder, recurrent severe without psychotic features: Secondary | ICD-10-CM

## 2017-08-18 DIAGNOSIS — R5381 Other malaise: Secondary | ICD-10-CM

## 2017-08-18 DIAGNOSIS — R51 Headache: Secondary | ICD-10-CM | POA: Diagnosis not present

## 2017-08-18 DIAGNOSIS — Z818 Family history of other mental and behavioral disorders: Secondary | ICD-10-CM

## 2017-08-18 DIAGNOSIS — Z6379 Other stressful life events affecting family and household: Secondary | ICD-10-CM

## 2017-08-18 MED ORDER — QUETIAPINE FUMARATE 100 MG PO TABS: 100.0000 mg | ORAL_TABLET | Freq: Every day | ORAL | 2 refills | Status: DC

## 2017-08-18 MED ORDER — AMPHETAMINE-DEXTROAMPHET ER 30 MG PO CP24: 30.0000 mg | ORAL_CAPSULE | ORAL | 0 refills | Status: DC

## 2017-08-18 MED ORDER — CLONAZEPAM 0.5 MG PO TABS: 0.5000 mg | ORAL_TABLET | Freq: Two times a day (BID) | ORAL | 2 refills | Status: DC | PRN
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
## 2017-08-24 ENCOUNTER — Ambulatory Visit (HOSPITAL_COMMUNITY): Payer: Self-pay | Admitting: Psychiatry

## 2017-09-07 ENCOUNTER — Ambulatory Visit (HOSPITAL_COMMUNITY): Payer: Self-pay | Admitting: Psychiatry

## 2017-09-13 ENCOUNTER — Encounter (HOSPITAL_COMMUNITY): Payer: Self-pay | Admitting: Psychiatry

## 2017-09-13 ENCOUNTER — Telehealth (HOSPITAL_COMMUNITY): Payer: Self-pay | Admitting: *Deleted

## 2017-09-13 NOTE — Telephone Encounter (Signed)
Pt had an appt with provider but had to be resch due to provider out of office. Per pt she is needing refills for all of her medications provider prescribes. 

## 2017-09-13 NOTE — Telephone Encounter (Signed)
This encounter was created in error - please disregard.

## 2017-09-13 NOTE — Telephone Encounter (Signed)
She has enough refill except Adderall XR (Per pmp,last filled on 08/18/2017). Ask her if she would like to pick up this prescription before the next appointment.

## 2017-09-14 ENCOUNTER — Ambulatory Visit (HOSPITAL_COMMUNITY): Payer: Self-pay | Admitting: Psychiatry

## 2017-09-14 NOTE — Telephone Encounter (Signed)
Per pt she thinks she have enough of her Adderall to last her until her appt with Dr. Harrington Challenger on 09-29-2017

## 2017-09-19 ENCOUNTER — Other Ambulatory Visit (HOSPITAL_COMMUNITY): Payer: Self-pay | Admitting: Psychiatry

## 2017-09-19 MED ORDER — AMPHETAMINE-DEXTROAMPHET ER 30 MG PO CP24: 30.0000 mg | ORAL_CAPSULE | ORAL | 0 refills | Status: DC

## 2017-09-20 ENCOUNTER — Encounter (HOSPITAL_COMMUNITY): Payer: Self-pay | Admitting: Psychiatry

## 2017-09-20 ENCOUNTER — Ambulatory Visit (INDEPENDENT_AMBULATORY_CARE_PROVIDER_SITE_OTHER): Payer: Medicaid Other | Admitting: Psychiatry

## 2017-09-20 DIAGNOSIS — F332 Major depressive disorder, recurrent severe without psychotic features: Secondary | ICD-10-CM
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
## 2017-09-23 IMAGING — DX DG FOOT COMPLETE 3+V*L*
3 series · 3 of 3 positions shown · non-contrast
Comparison: None.

CLINICAL DATA: Fall.  Pain

EXAM:
LEFT FOOT - COMPLETE 3+ VIEW

[foot ap]
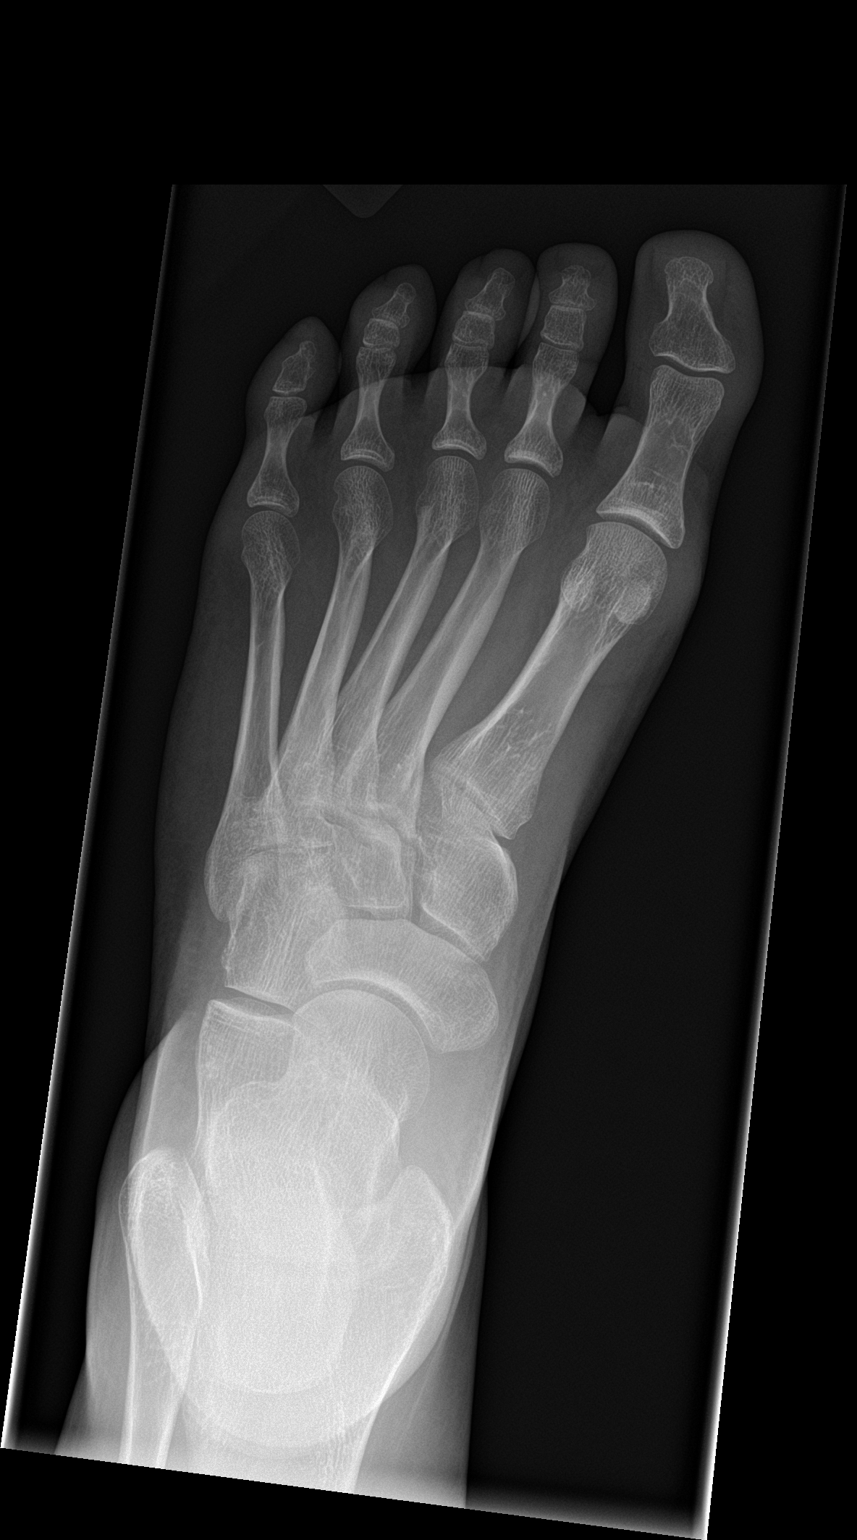

[foot obl]
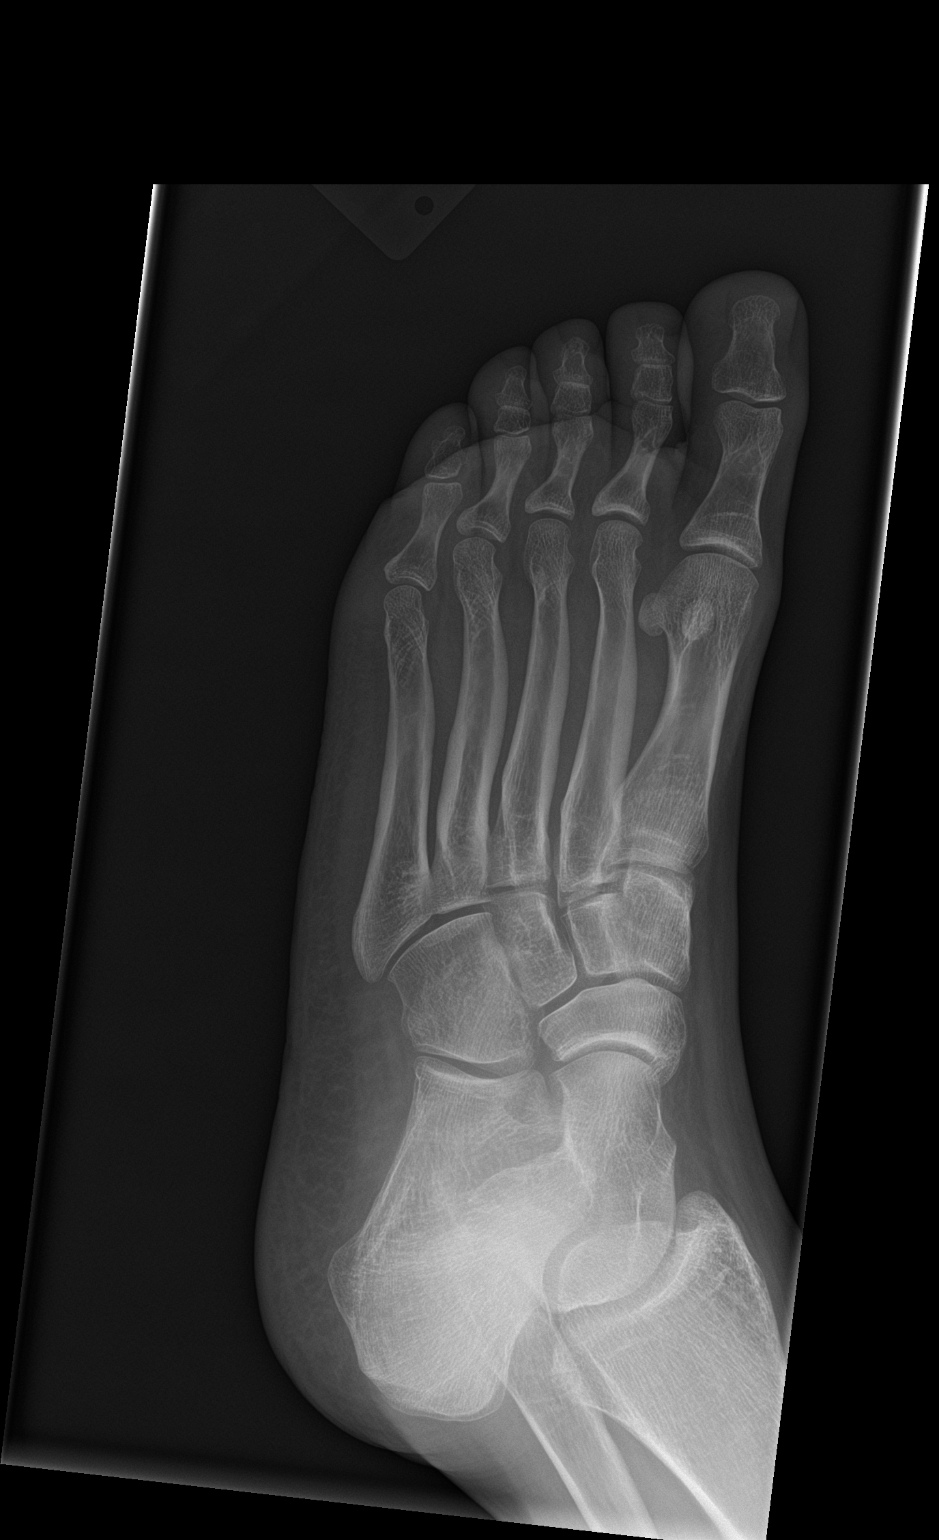

[foot lat]
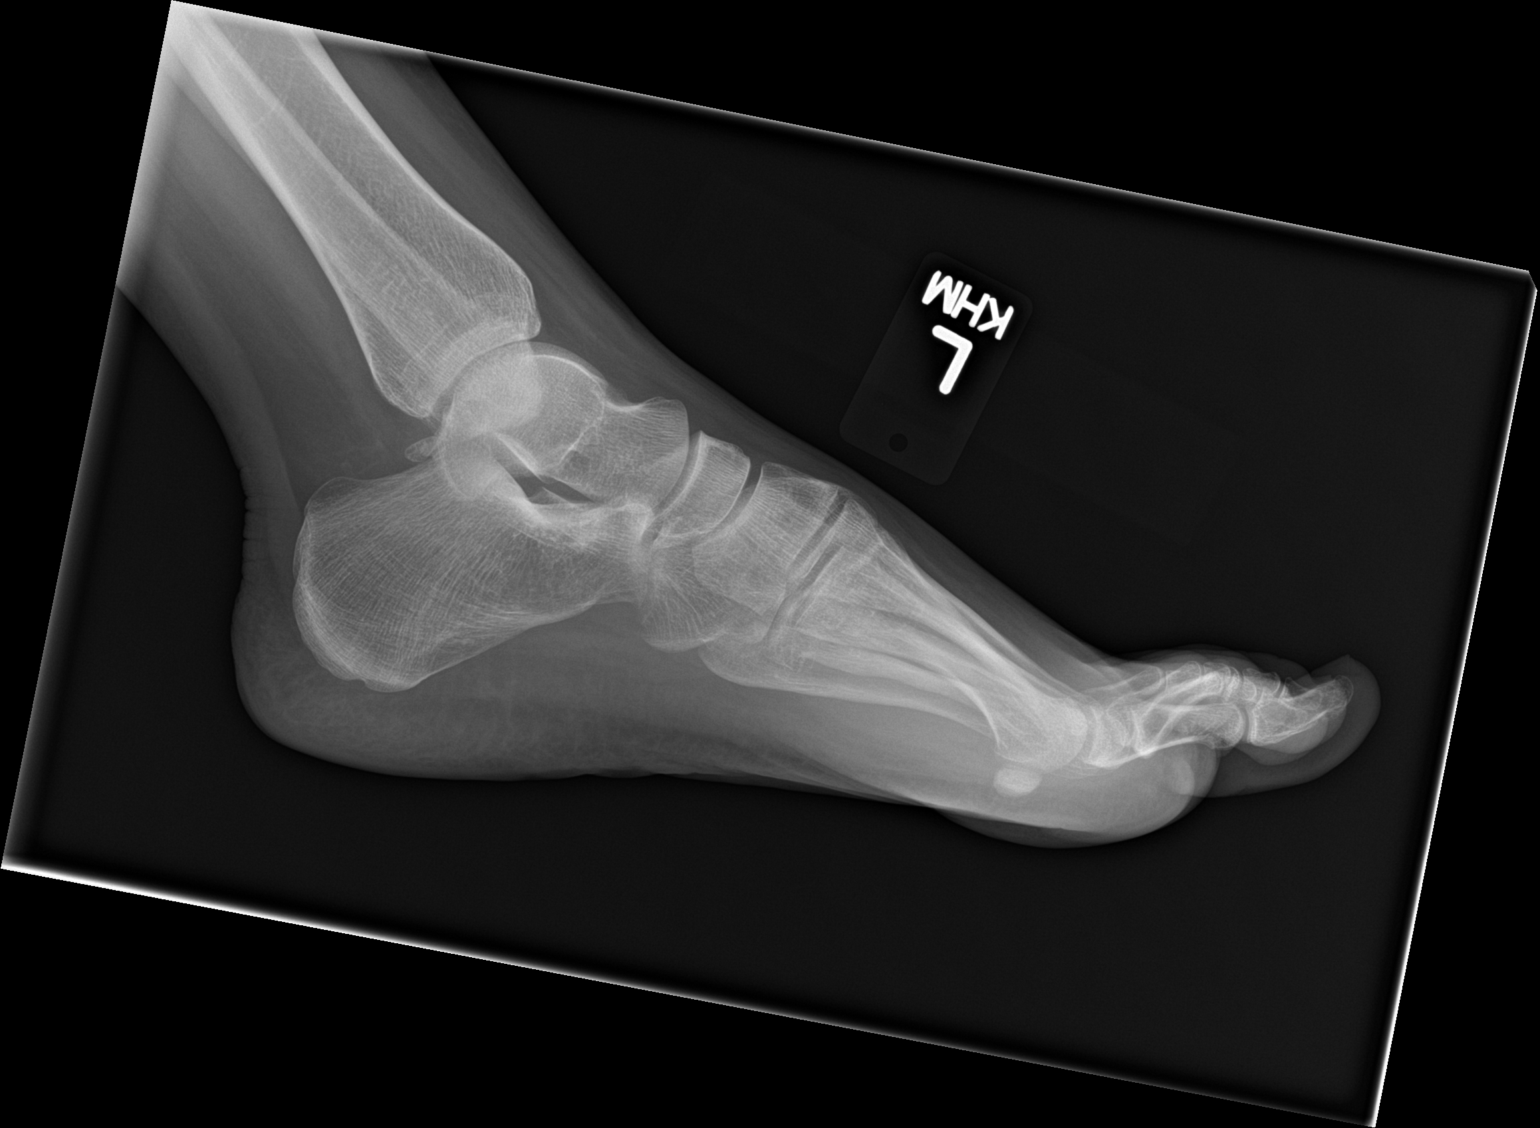

[3 of 3 positions shown; findings below may reference images not displayed]

FINDINGS: There is no evidence of fracture or dislocation. There is no
evidence of arthropathy or other focal bone abnormality. Soft
tissues are unremarkable.
IMPRESSION: Negative.

## 2017-09-23 IMAGING — DX DG ANKLE COMPLETE 3+V*L*
3 series · 3 of 3 positions shown · non-contrast
Comparison: None.

CLINICAL DATA: LEFT ANKLE PAIN, PT c/o left ankle pain after
rolling it x2 days ago. HISTORY OF HEART MURMUR

EXAM:
LEFT ANKLE COMPLETE - 3+ VIEW

[ankle ap]
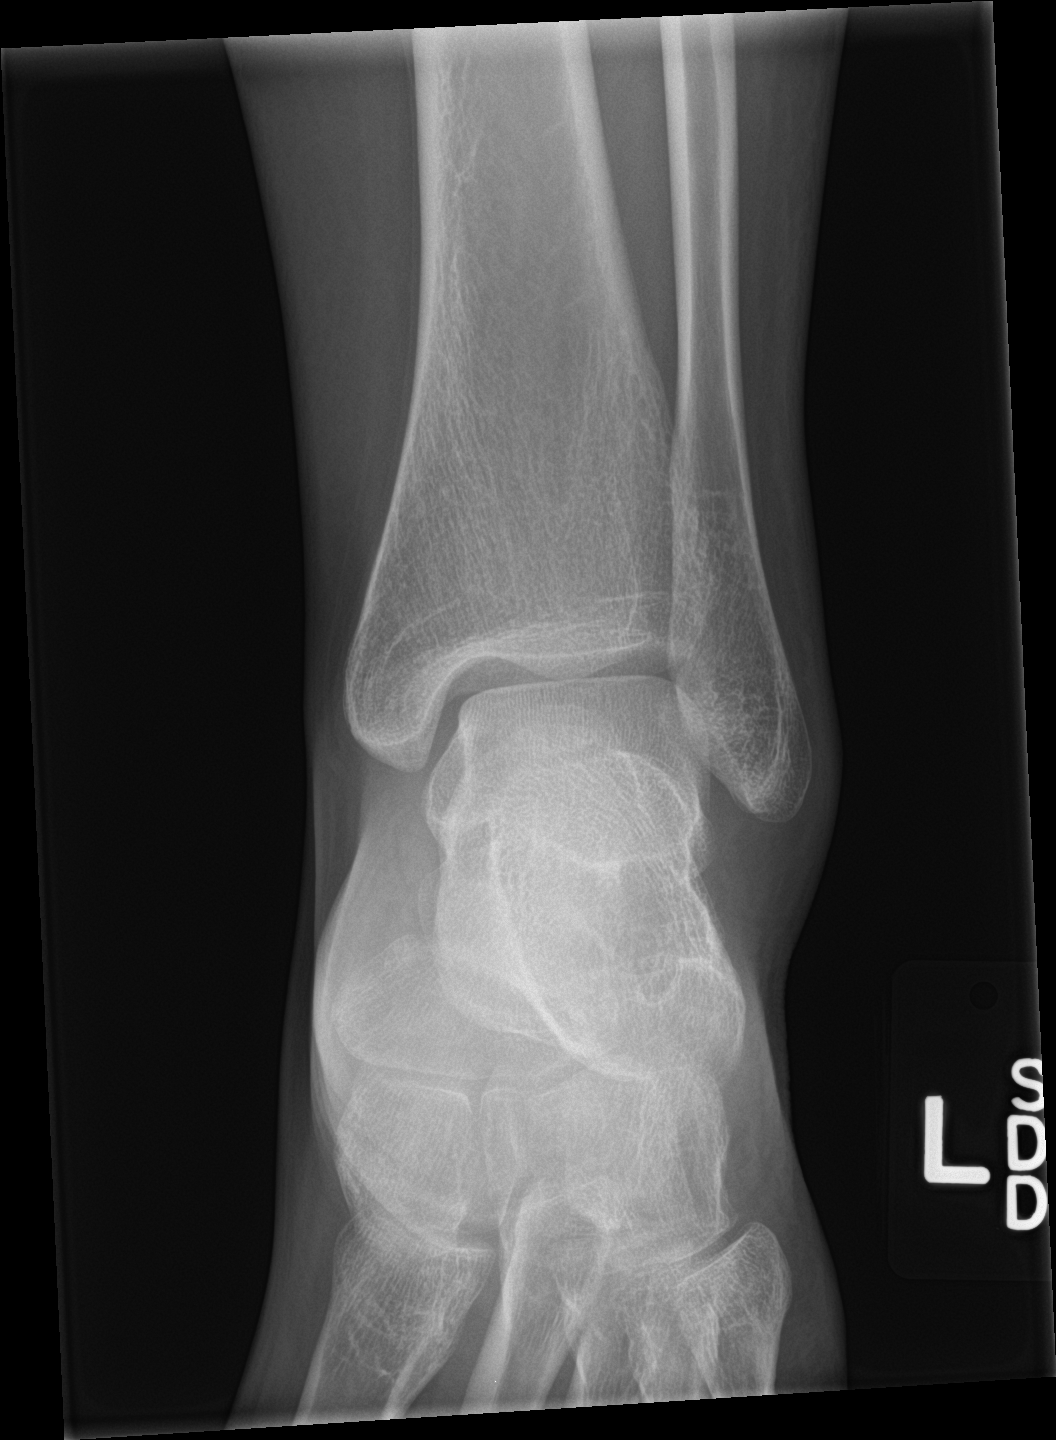

[ankle obl]
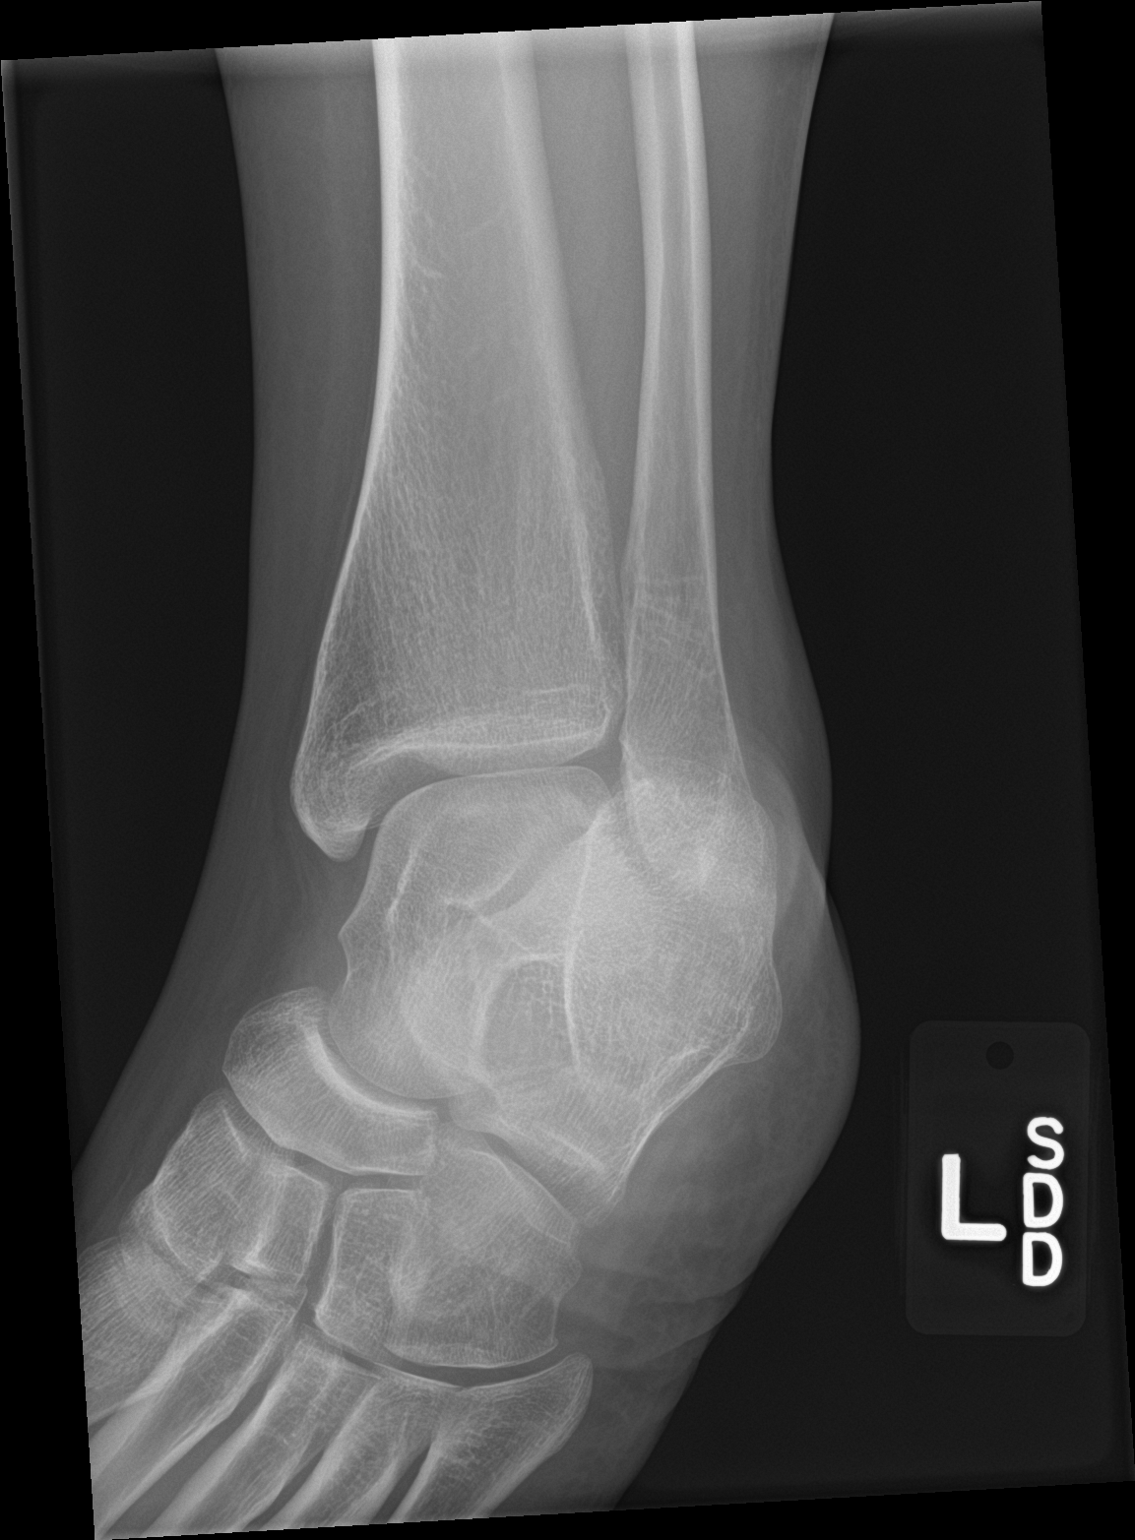

[ankle lat]
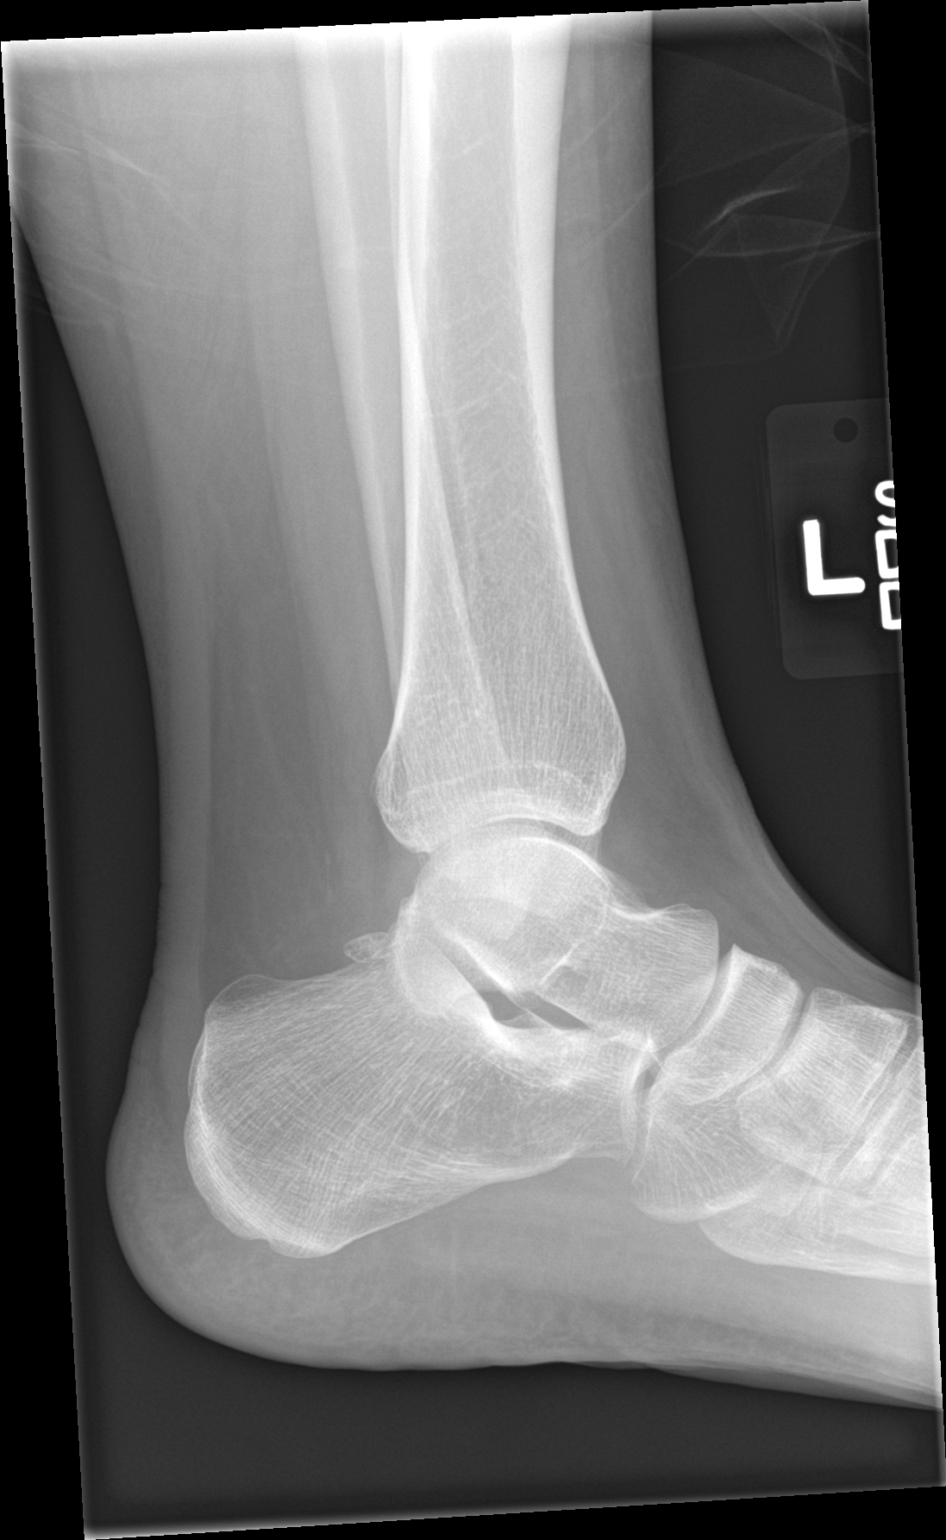

[3 of 3 positions shown; findings below may reference images not displayed]

FINDINGS: There is no evidence of fracture, dislocation, or joint effusion.
There is no evidence of arthropathy or other focal bone abnormality.
Soft tissues are unremarkable.
IMPRESSION: Negative.

## 2017-09-29 ENCOUNTER — Encounter: Payer: Self-pay | Admitting: Family Medicine

## 2017-09-29 ENCOUNTER — Ambulatory Visit (INDEPENDENT_AMBULATORY_CARE_PROVIDER_SITE_OTHER): Payer: Medicaid Other | Admitting: Family Medicine

## 2017-09-29 ENCOUNTER — Encounter (HOSPITAL_COMMUNITY): Payer: Self-pay | Admitting: Psychiatry

## 2017-09-29 ENCOUNTER — Ambulatory Visit (INDEPENDENT_AMBULATORY_CARE_PROVIDER_SITE_OTHER): Payer: Medicaid Other | Admitting: Psychiatry

## 2017-09-29 VITALS — BP 114/74 | Temp 97.7°F | Ht 66.0 in | Wt 201.2 lb

## 2017-09-29 VITALS — BP 126/83 | HR 93 | Ht 66.0 in | Wt 202.6 lb

## 2017-09-29 DIAGNOSIS — G4700 Insomnia, unspecified: Secondary | ICD-10-CM

## 2017-09-29 DIAGNOSIS — F17210 Nicotine dependence, cigarettes, uncomplicated: Secondary | ICD-10-CM

## 2017-09-29 DIAGNOSIS — Z79899 Other long term (current) drug therapy: Secondary | ICD-10-CM

## 2017-09-29 DIAGNOSIS — J040 Acute laryngitis: Secondary | ICD-10-CM

## 2017-09-29 DIAGNOSIS — F332 Major depressive disorder, recurrent severe without psychotic features: Secondary | ICD-10-CM | POA: Diagnosis not present

## 2017-09-29 DIAGNOSIS — J019 Acute sinusitis, unspecified: Secondary | ICD-10-CM

## 2017-09-29 DIAGNOSIS — M25372 Other instability, left ankle: Secondary | ICD-10-CM | POA: Diagnosis not present

## 2017-09-29 DIAGNOSIS — Z818 Family history of other mental and behavioral disorders: Secondary | ICD-10-CM

## 2017-09-29 MED ORDER — DULOXETINE HCL 60 MG PO CPEP: 120.0000 mg | ORAL_CAPSULE | Freq: Every day | ORAL | 2 refills | Status: DC

## 2017-09-29 MED ORDER — AMOXICILLIN-POT CLAVULANATE 875-125 MG PO TABS: 1.0000 | ORAL_TABLET | Freq: Two times a day (BID) | ORAL | 0 refills | Status: DC

## 2017-09-29 MED ORDER — CLONAZEPAM 0.5 MG PO TABS: 0.5000 mg | ORAL_TABLET | Freq: Two times a day (BID) | ORAL | 2 refills | Status: DC | PRN

## 2017-09-29 MED ORDER — CARBAMAZEPINE 200 MG PO TABS: ORAL_TABLET | ORAL | 2 refills | Status: DC

## 2017-09-29 MED ORDER — QUETIAPINE FUMARATE 50 MG PO TABS: 50.0000 mg | ORAL_TABLET | Freq: Every day | ORAL | 2 refills | Status: DC

## 2017-09-29 MED ORDER — AMPHETAMINE-DEXTROAMPHET ER 30 MG PO CP24: 30.0000 mg | ORAL_CAPSULE | ORAL | 0 refills | Status: DC
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
## 2017-09-29 NOTE — Progress Notes (Signed)
   Subjective:    Patient ID: Penny [name], female    DOB: Apr 15, 1986, 31 y.o.   MRN: [id number]  Cough  This is a new problem. The current episode started 1 to 4 weeks ago. Associated symptoms include chest pain, nasal congestion, rhinorrhea, shortness of breath and wheezing. Pertinent negatives include no ear pain or fever.   Patient relates a lot of head congestion drainage coughing some laryngitis symptoms symptoms present over the past several days worse over the past couple days sinus pressure pain discomfort   Review of Systems  Constitutional: Negative for activity change and fever.  HENT: Positive for congestion and rhinorrhea. Negative for ear pain.   Eyes: Negative for discharge.  Respiratory: Positive for cough, shortness of breath and wheezing.   Cardiovascular: Positive for chest pain.       Objective:   Physical Exam  Constitutional: She appears well-developed.  HENT:  Head: Normocephalic.  Right Ear: External ear normal.  Left Ear: External ear normal.  Nose: Nose normal.  Mouth/Throat: Oropharynx is clear and moist. No oropharyngeal exudate.  Eyes: Right eye exhibits no discharge. Left eye exhibits no discharge.  Neck: Neck supple. No tracheal deviation present.  Cardiovascular: Normal rate and normal heart sounds.   No murmur heard. Pulmonary/Chest: Effort normal and breath sounds normal. She has no wheezes. She has no rales.  Lymphadenopathy:    She has no cervical adenopathy.  Skin: Skin is warm and dry.  Nursing note and vitals reviewed.         Assessment & Plan:  Viral syndrome Secondary rhinosinusitis Antibiotics prescribed warning signs discussed follow-up if problems

## 2017-09-30 ENCOUNTER — Telehealth: Payer: Self-pay | Admitting: Family Medicine

## 2017-09-30 MED ORDER — AMOXICILLIN 400 MG/5ML PO SUSR: ORAL | 0 refills | Status: DC

## 2017-09-30 MED ORDER — FLUCONAZOLE 150 MG PO TABS: 150.0000 mg | ORAL_TABLET | Freq: Every day | ORAL | 0 refills | Status: DC

## 2017-09-30 NOTE — Telephone Encounter (Signed)
Please let the patient know we will call this over to the pharmacy.  If they can do liquid we will do liquid but if insurance will not cover getting the liquids since she just got the tablets we will have to use a different antibiotic.  Try to get amoxicillin 400 mg per 5 mL 2 teaspoons twice daily for 10 days, Diflucan 150 mg 1 p.o. x1

## 2017-09-30 NOTE — Telephone Encounter (Signed)
Prescriptions sent electronically to pharmacy. Patient notified. °

## 2017-09-30 NOTE — Telephone Encounter (Signed)
Patient was put on amoxicillin yesterday and is having trouble digesting them.  They are making her sick on her stomach.  She is wanting to know if this can be changed to a liquid form.  Also, would like Rx for diflucan for yeast infection.  [employer]

## 2017-10-03 IMAGING — DX DG TIBIA/FIBULA 2V*L*
3 series · 3 of 3 positions shown · non-contrast
Comparison: 10/05/2015

CLINICAL DATA: Inversion injury 1 week ago with persistent pain

EXAM:
LEFT TIBIA AND FIBULA - 2 VIEW

[tibia ap (1 of 2)]
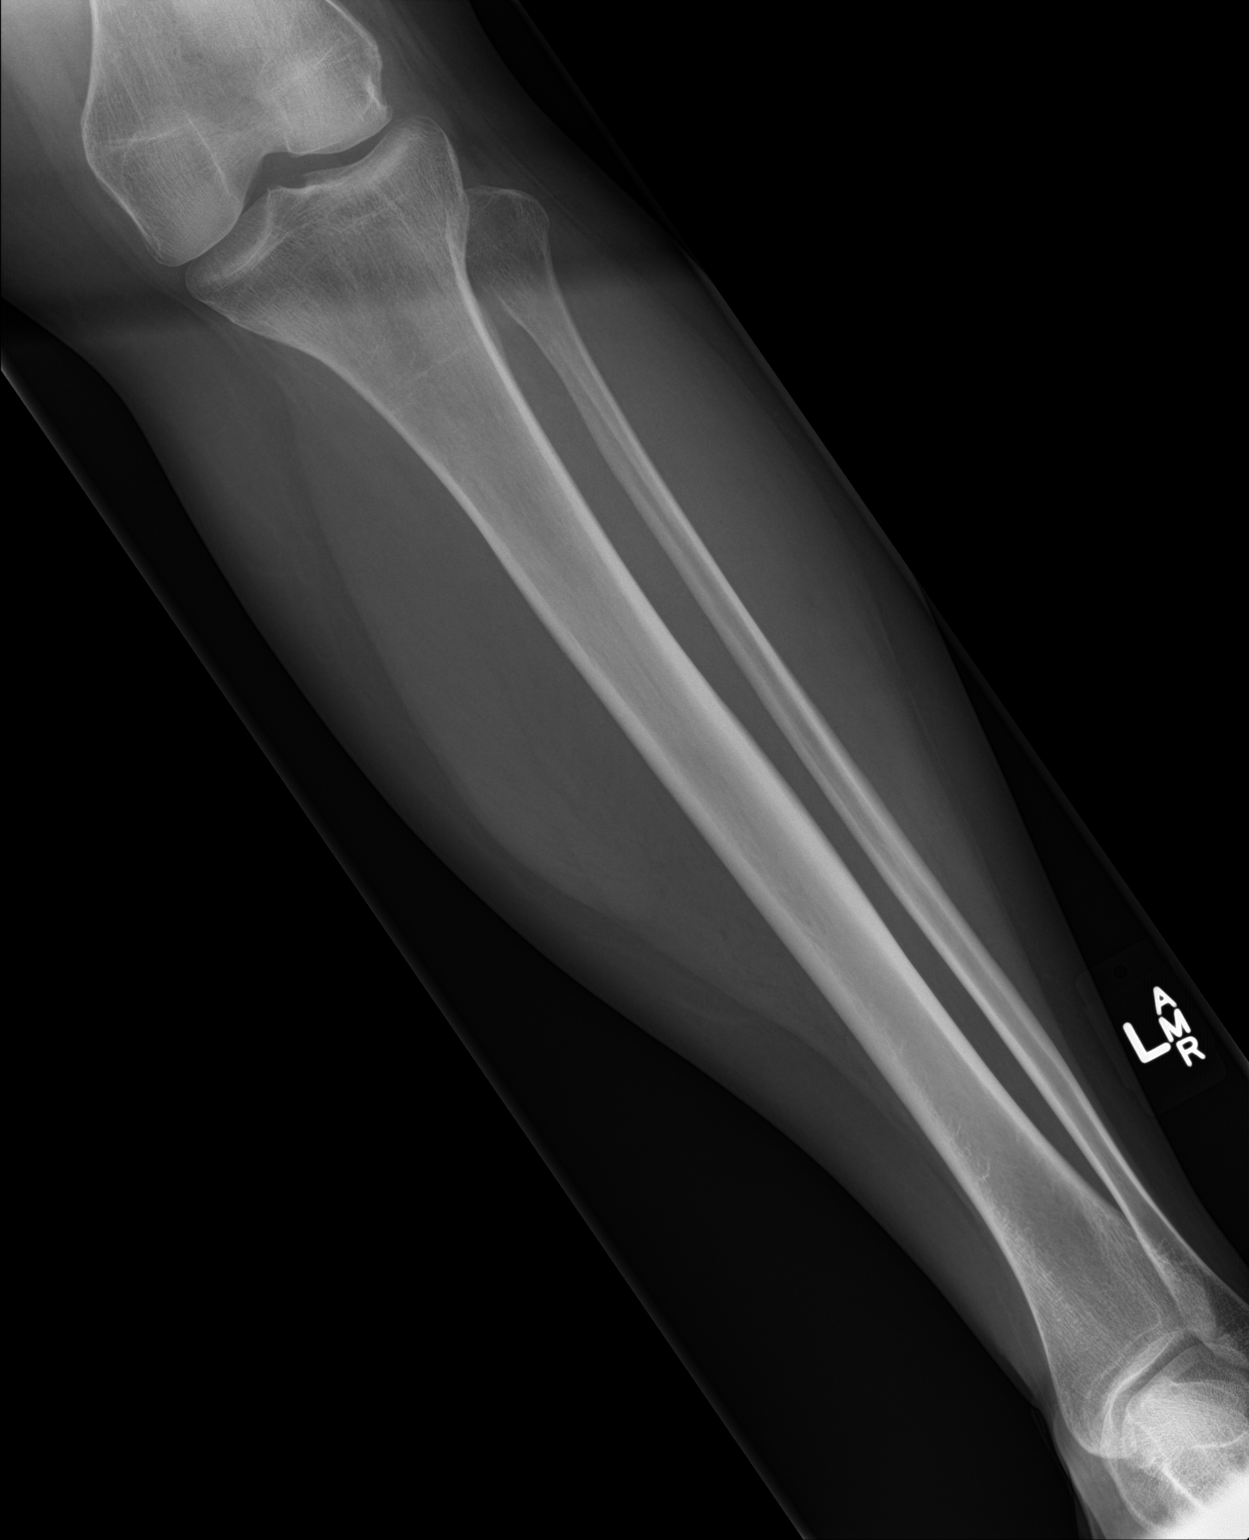

[tibia ap (2 of 2)]
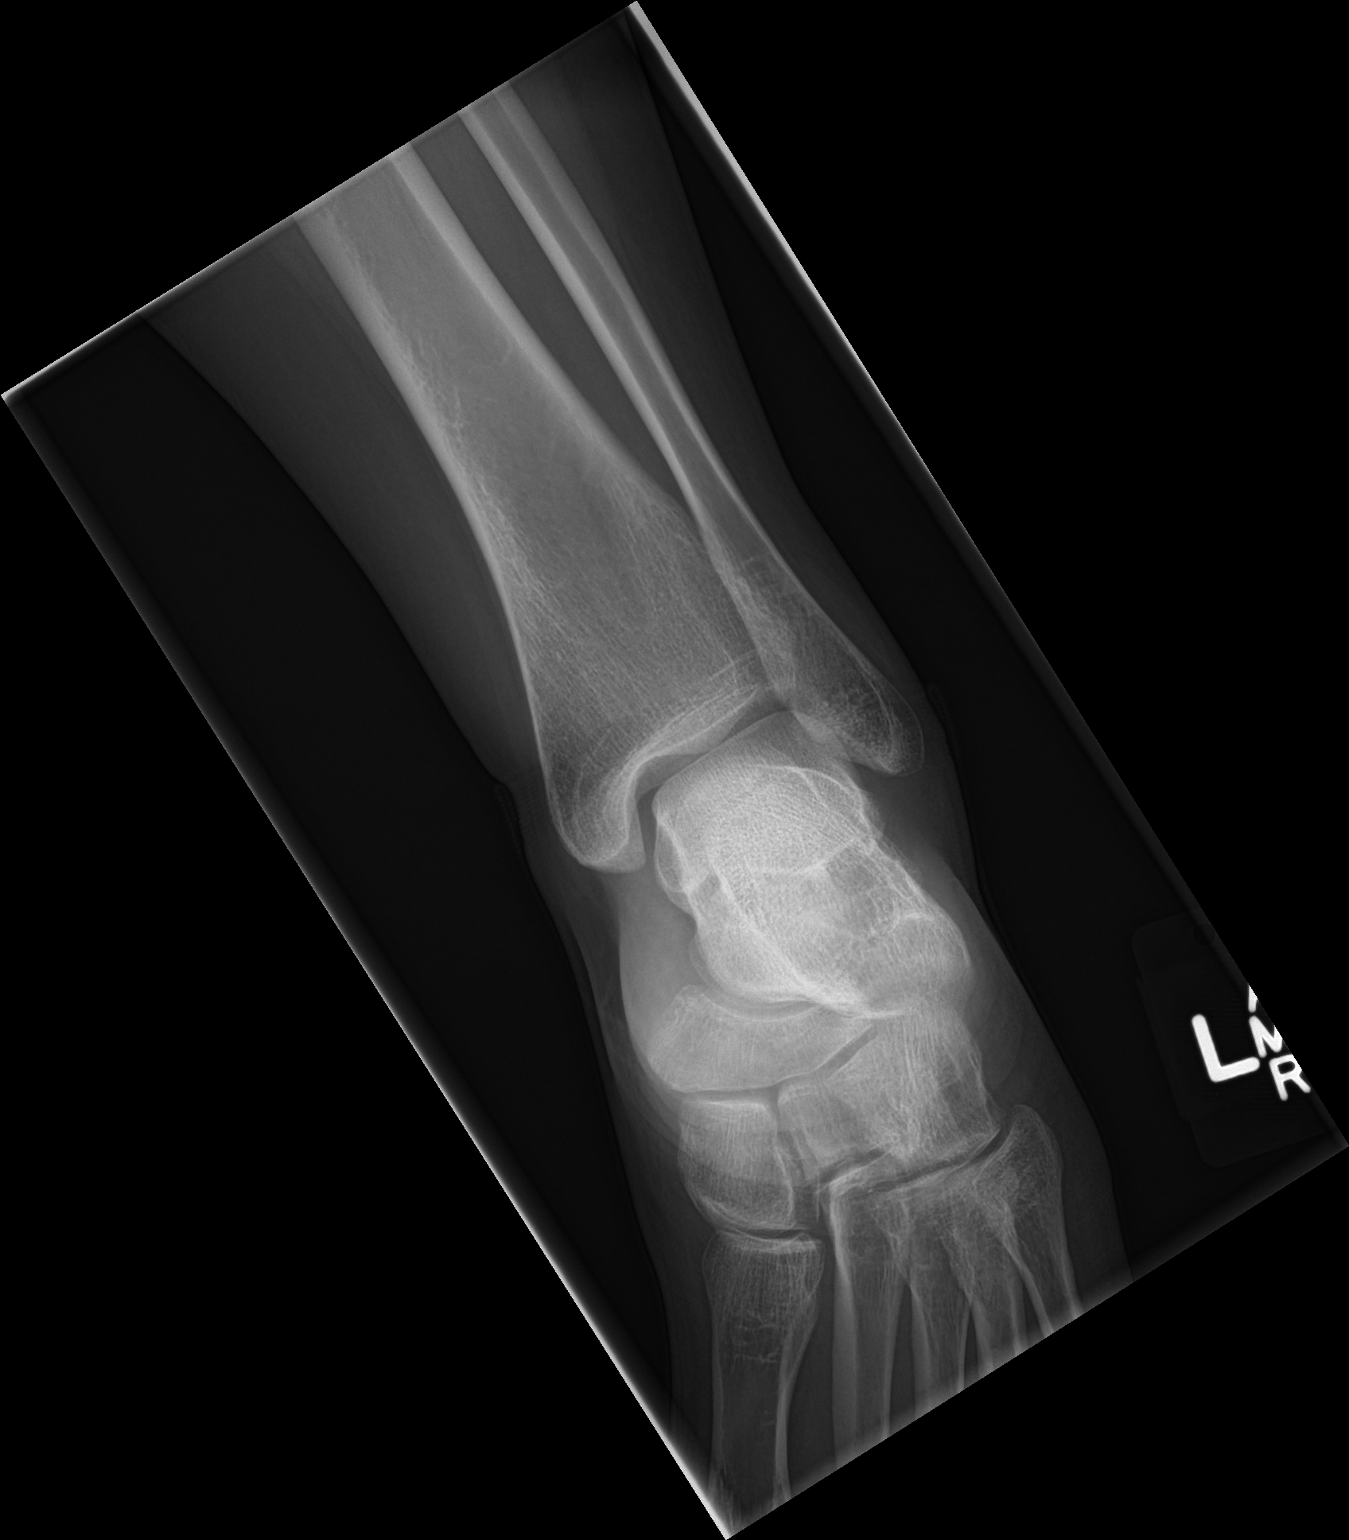

[tibia lat]
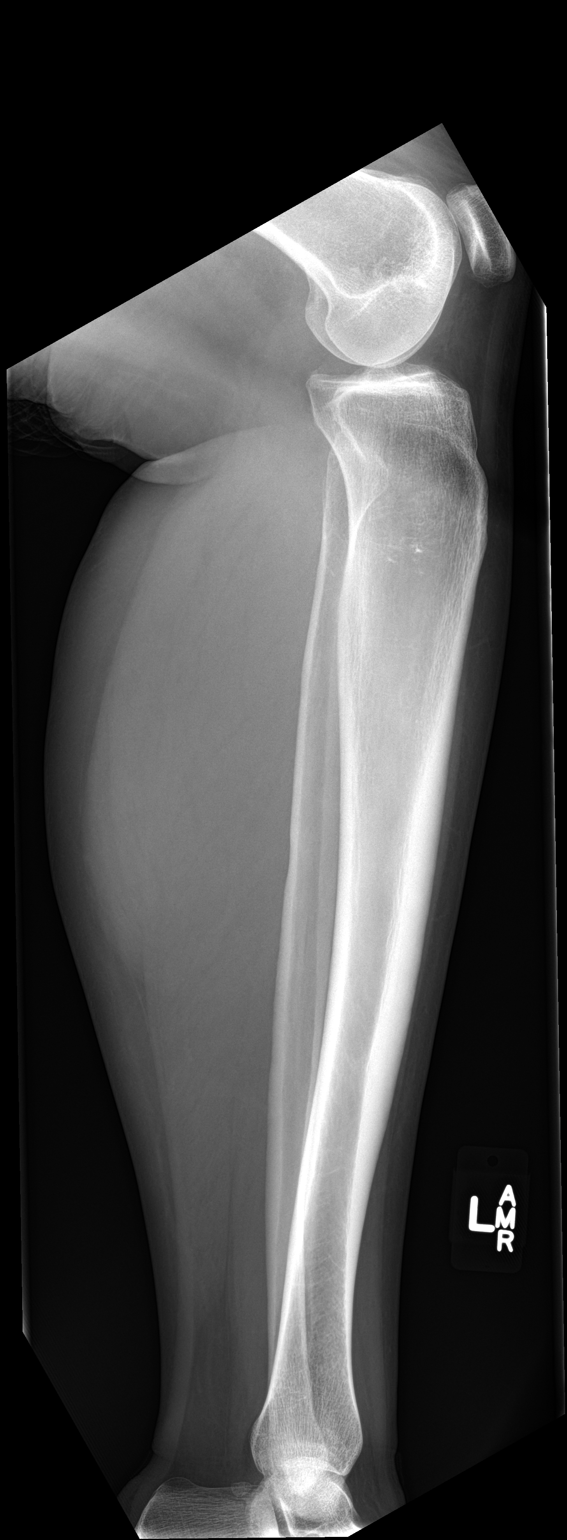

[3 of 3 positions shown; findings below may reference images not displayed]

FINDINGS: There is no evidence of fracture or other focal bone lesions. Soft
tissues are unremarkable.
IMPRESSION: No acute abnormality noted.

## 2017-10-08 IMAGING — DX DG LUMBAR SPINE COMPLETE 4+V
5 series · 5 of 5 positions shown · non-contrast
Comparison: CT abdomen and pelvis June 11, 2015

CLINICAL DATA: Fell 2 weeks ago, fell again using crutches.

EXAM:
LUMBAR SPINE - COMPLETE 4+ VIEW

[l-spine ap]
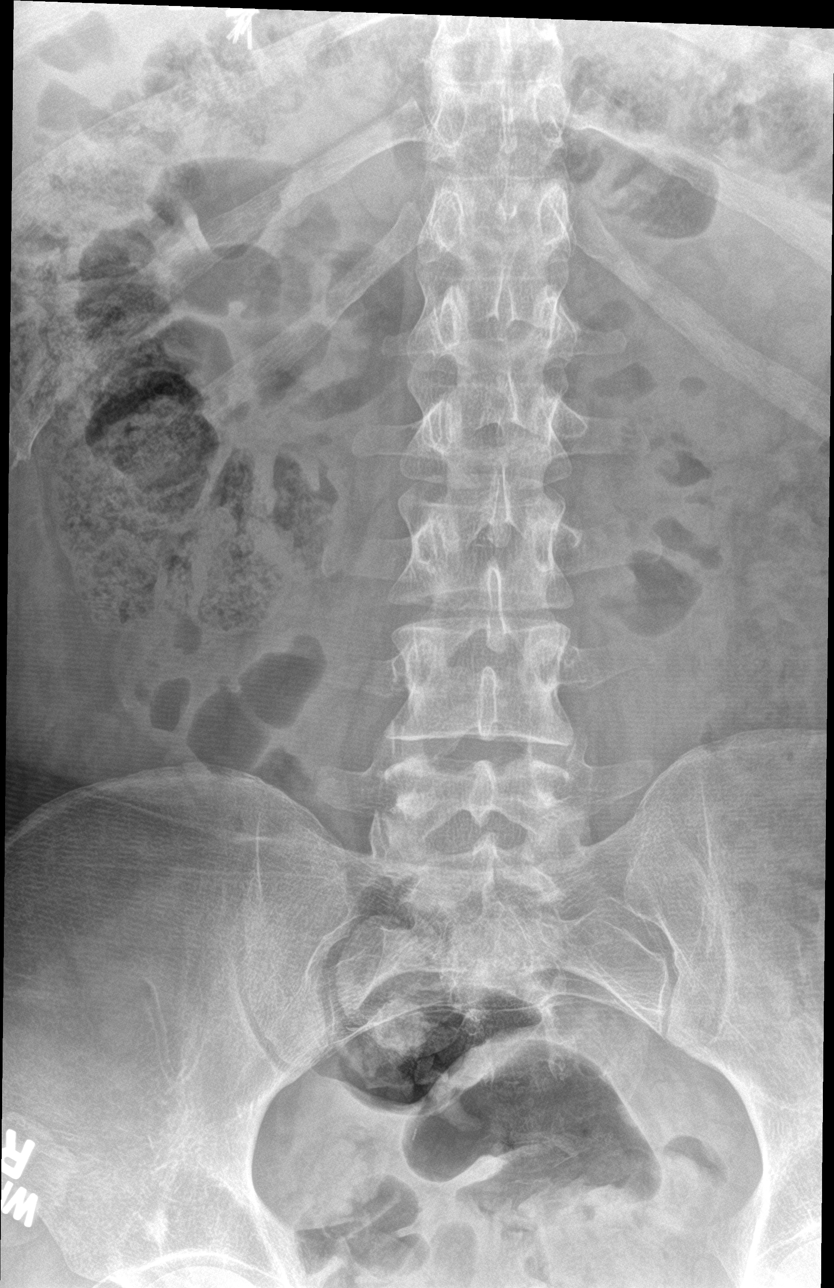

[l-spine obl (1 of 2)]
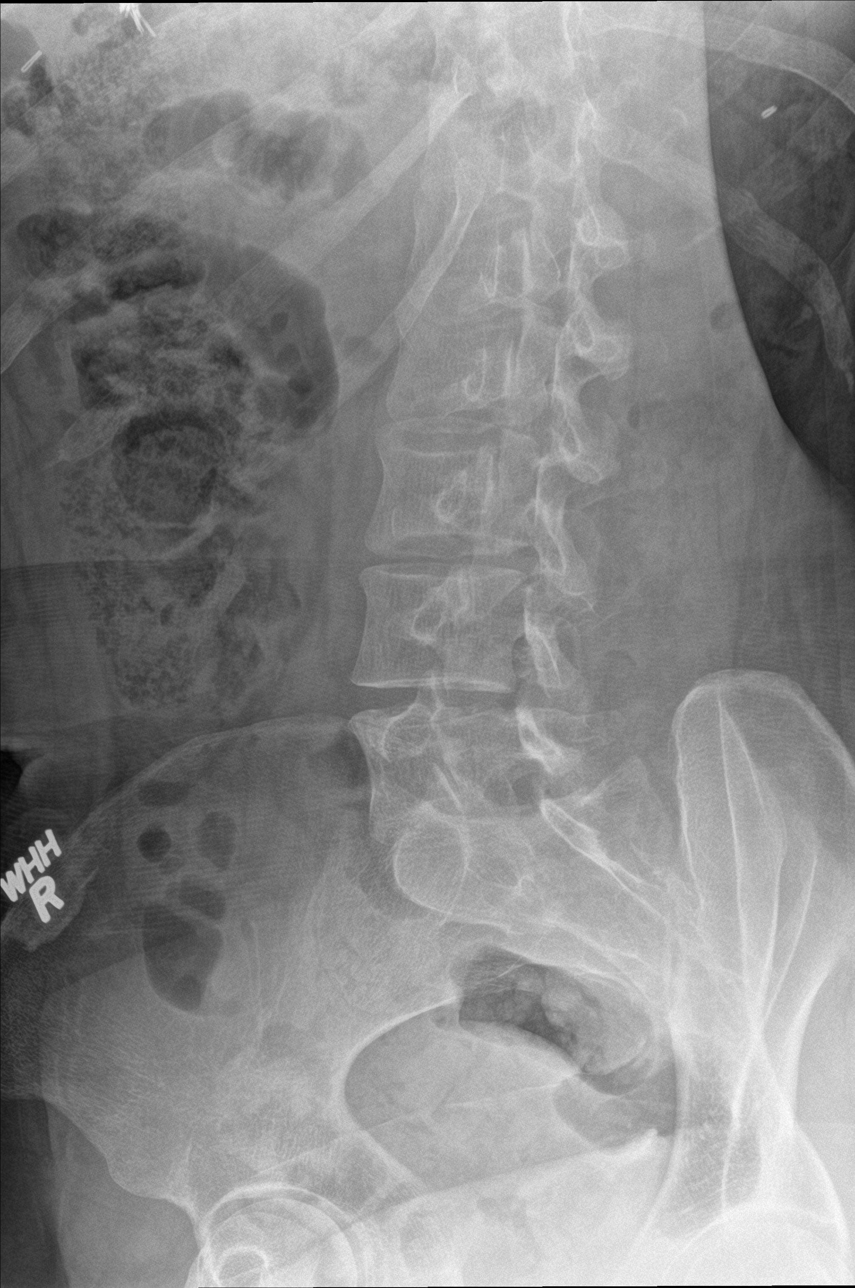

[l-spine obl (2 of 2)]
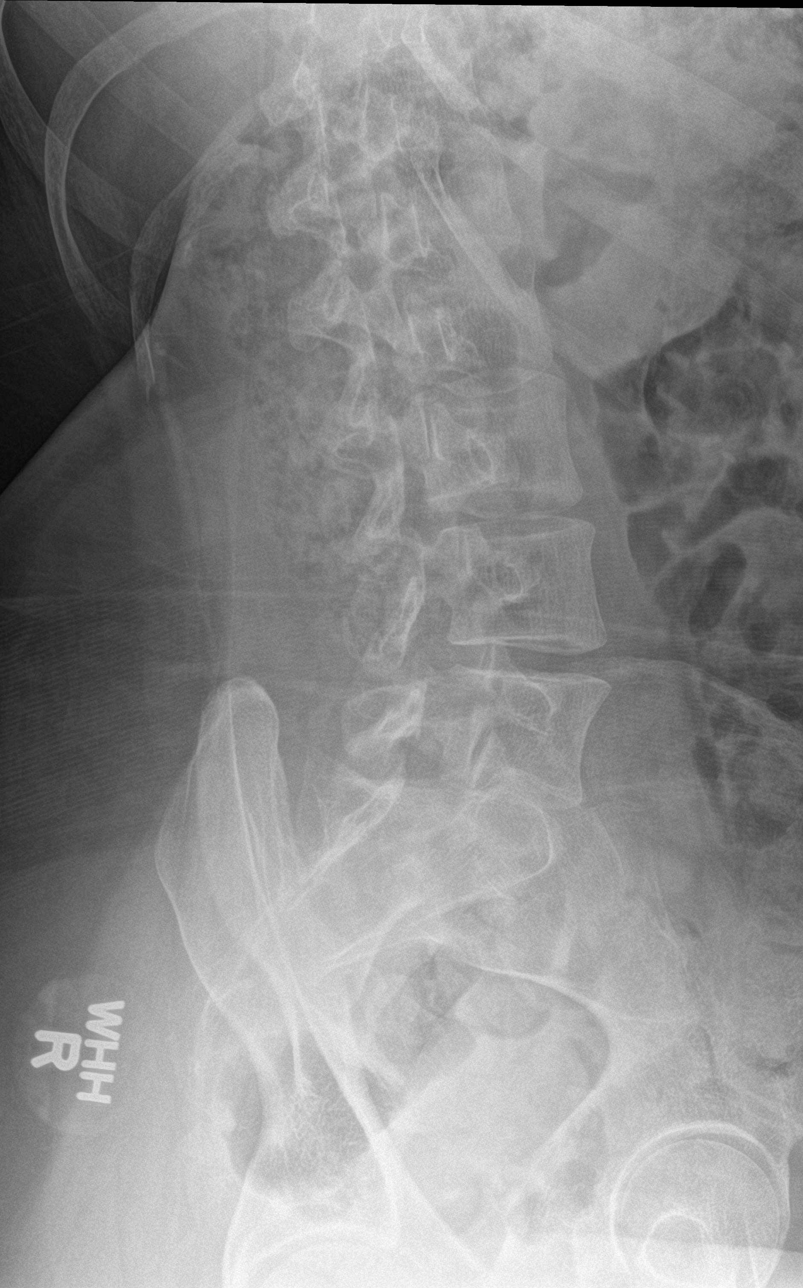

[l-spine lat]
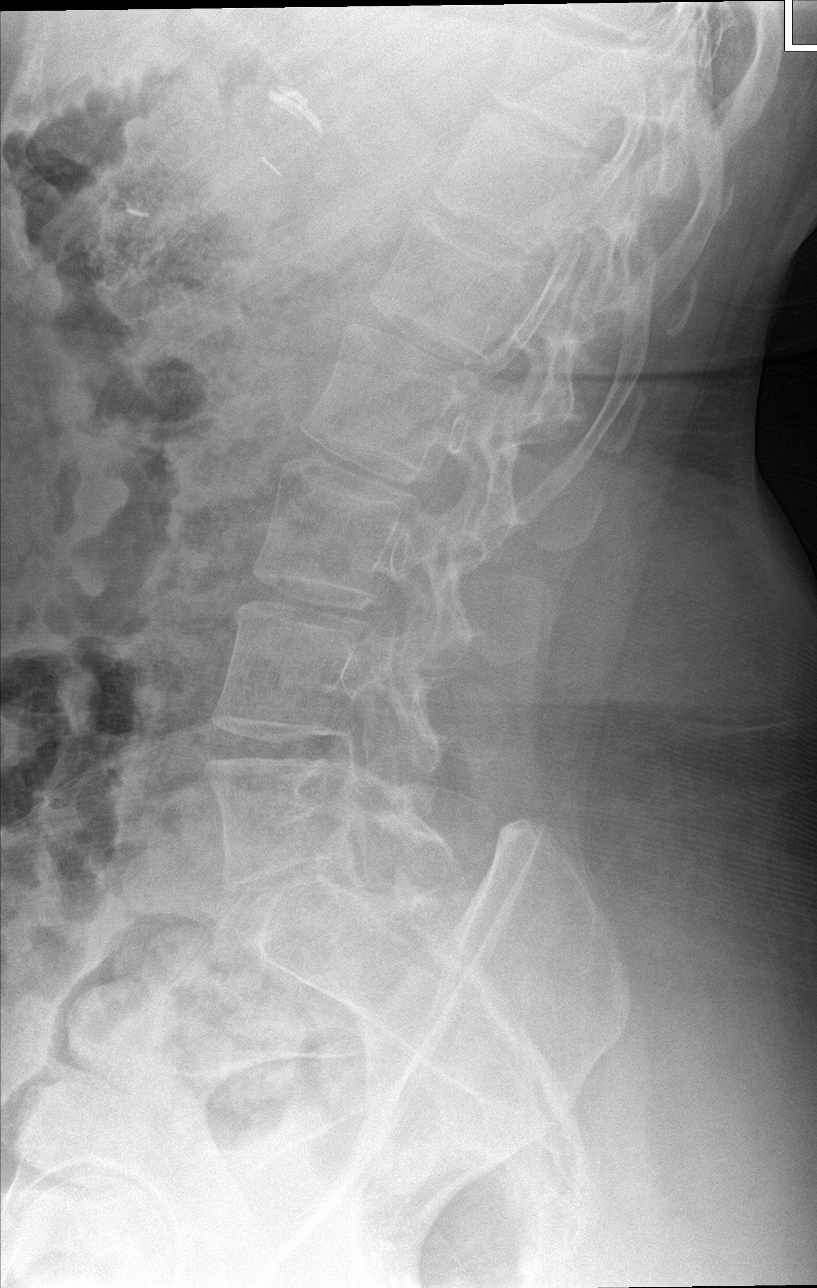

[l-spine spot]
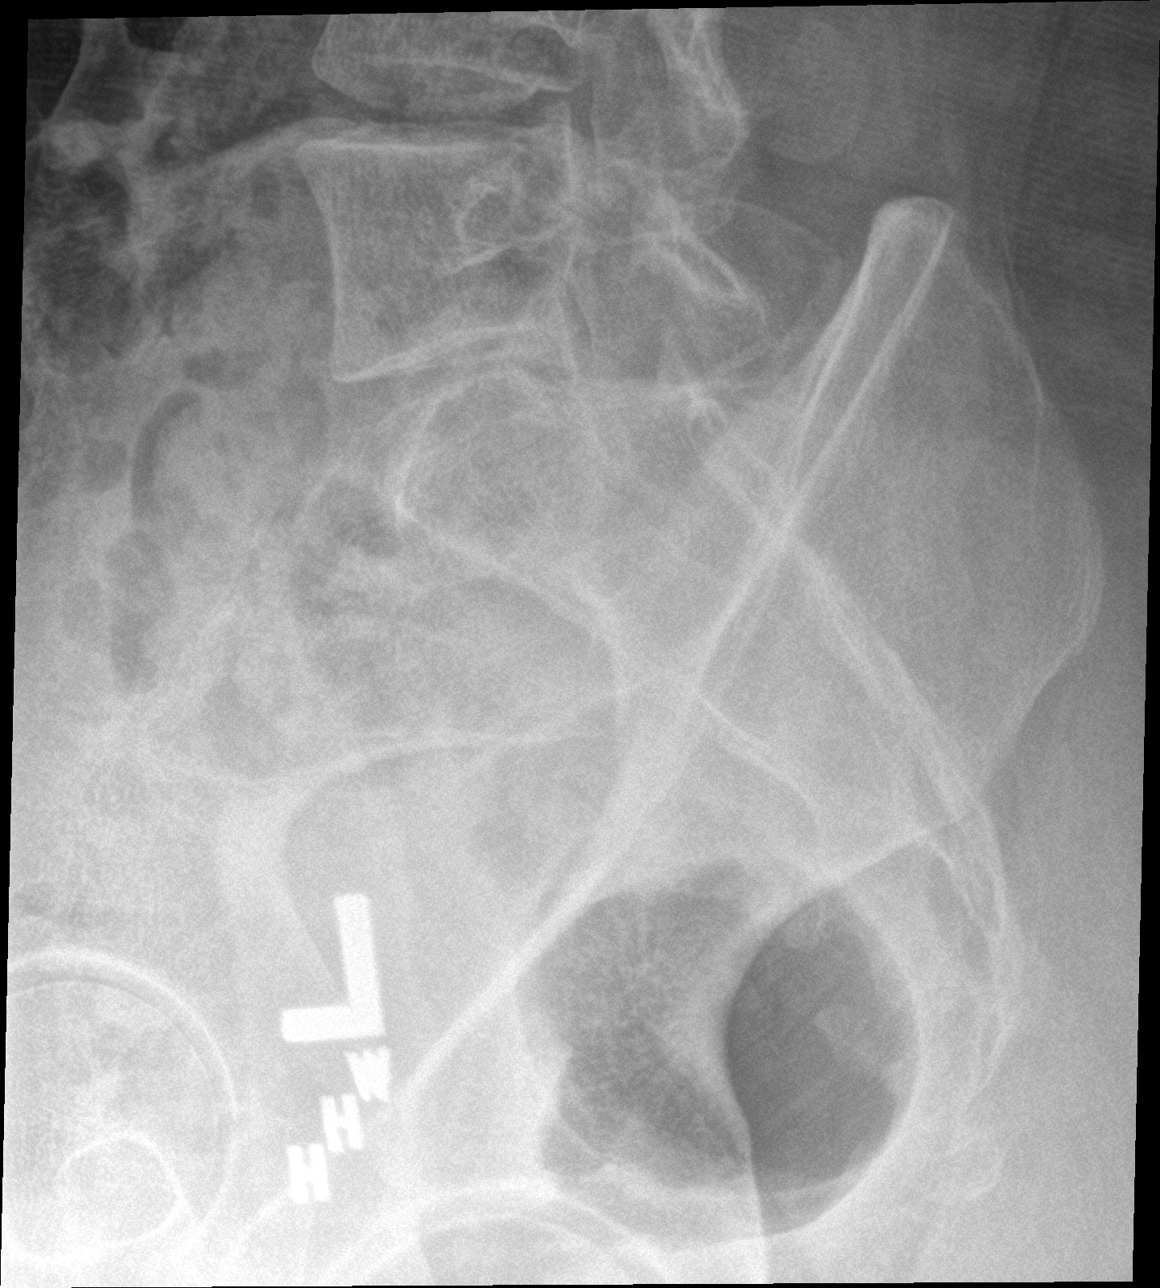

[5 of 5 positions shown; findings below may reference images not displayed]

FINDINGS: Five non rib-bearing lumbar-type vertebral bodies are intact and
aligned with maintenance of the lumbar lordosis. Intervertebral disc
heights are normal. No destructive bony lesions. No pars
interarticularis defects.

Sacroiliac joints are symmetric. Moderate amount of retained large
bowel stool partially imaged. Included prevertebral and paraspinal
soft tissue planes are non-suspicious. Surgical clips in the
included right abdomen compatible with cholecystectomy.
IMPRESSION: Negative.

## 2017-10-11 ENCOUNTER — Ambulatory Visit (HOSPITAL_COMMUNITY): Payer: Self-pay | Admitting: Psychiatry

## 2017-10-19 ENCOUNTER — Telehealth: Payer: Self-pay | Admitting: Family Medicine

## 2017-10-19 NOTE — Telephone Encounter (Signed)
zanaflex 4 mg one tid prn home use only, #15

## 2017-10-19 NOTE — Telephone Encounter (Signed)
Patient has had a headache for 3 days.  She cannot seem to get rid of it and exhausted all of her resources.  She doesn't want to go to the ER because they wont do anything.  She wants to know if Dr. Nicki Reaper will write her for 3 days of a muscle relaxer.  She said she usually uses tizanidine.  She said she hasn't had one like this in 3 months.  [employer]

## 2017-10-19 NOTE — Telephone Encounter (Signed)
Please do not feel this previous order.  Upon inspection of her medications this patient is already on long-acting pain medicine as well as medication using a muscle relaxer even only for a few days dramatically increases her risk of accidental overdose and accidental death so therefore by [facility] of medicine guidelines I cannot prescribe this medicine

## 2017-10-19 NOTE — Telephone Encounter (Signed)
Nurses-please disregard previous message.  Do not send in muscle relaxer.  I did call [facility] and let them know that we will not be sending in a muscle relaxer.  Please let the patient know that unfortunately the combination of pain medications and muscle relaxers (along with the fact that she is also on benzodiazepines) significantly increase the risk of accidental overdose and death and we are being told by the medical board that we should not be prescribing all of these together-so therefore no muscle relaxer

## 2017-10-20 NOTE — Telephone Encounter (Signed)
Spoke with patient and informed her per Dr.Scott Luking- Upon inspection of your medications you are lready on long-acting pain medicine as well as medication using a muscle relaxer even only for a few days dramatically increases your risk of accidental overdose and accidental death so therefore by [name] of medicine guidelines Dr.Scott  cannot prescribe this medicine. Patient verbalized understanding.

## 2017-10-20 NOTE — Telephone Encounter (Signed)
See previous message - Patient aware

## 2017-10-25 ENCOUNTER — Ambulatory Visit (HOSPITAL_COMMUNITY): Payer: Self-pay | Admitting: Psychiatry

## 2017-10-31 ENCOUNTER — Ambulatory Visit (HOSPITAL_COMMUNITY): Payer: Self-pay | Admitting: Psychiatry

## 2017-11-01 ENCOUNTER — Other Ambulatory Visit (HOSPITAL_COMMUNITY): Payer: Self-pay | Admitting: Psychiatry

## 2017-11-03 ENCOUNTER — Ambulatory Visit: Payer: Medicaid Other | Admitting: Obstetrics and Gynecology

## 2017-11-07 ENCOUNTER — Ambulatory Visit (INDEPENDENT_AMBULATORY_CARE_PROVIDER_SITE_OTHER): Payer: Medicaid Other | Admitting: Psychiatry

## 2017-11-07 ENCOUNTER — Other Ambulatory Visit: Payer: Self-pay | Admitting: Family Medicine

## 2017-11-07 ENCOUNTER — Encounter (HOSPITAL_COMMUNITY): Payer: Self-pay | Admitting: Psychiatry

## 2017-11-07 VITALS — BP 116/68 | HR 91 | Ht 66.0 in | Wt 208.0 lb

## 2017-11-07 DIAGNOSIS — F332 Major depressive disorder, recurrent severe without psychotic features: Secondary | ICD-10-CM | POA: Diagnosis not present

## 2017-11-07 DIAGNOSIS — R5382 Chronic fatigue, unspecified: Secondary | ICD-10-CM

## 2017-11-07 DIAGNOSIS — Z79899 Other long term (current) drug therapy: Secondary | ICD-10-CM | POA: Diagnosis not present

## 2017-11-07 DIAGNOSIS — Z8659 Personal history of other mental and behavioral disorders: Secondary | ICD-10-CM | POA: Diagnosis not present

## 2017-11-07 DIAGNOSIS — F17210 Nicotine dependence, cigarettes, uncomplicated: Secondary | ICD-10-CM

## 2017-11-07 DIAGNOSIS — Z818 Family history of other mental and behavioral disorders: Secondary | ICD-10-CM

## 2017-11-07 MED ORDER — CARBAMAZEPINE 200 MG PO TABS: ORAL_TABLET | ORAL | 2 refills | Status: DC

## 2017-11-07 MED ORDER — AMPHETAMINE-DEXTROAMPHET ER 30 MG PO CP24: 30.0000 mg | ORAL_CAPSULE | ORAL | 0 refills | Status: DC

## 2017-11-07 MED ORDER — CLONAZEPAM 0.5 MG PO TABS: 0.5000 mg | ORAL_TABLET | Freq: Three times a day (TID) | ORAL | 2 refills | Status: DC | PRN

## 2017-11-07 MED ORDER — TOPIRAMATE 50 MG PO TABS: ORAL_TABLET | ORAL | 2 refills | Status: DC

## 2017-11-07 MED ORDER — DULOXETINE HCL 60 MG PO CPEP: 120.0000 mg | ORAL_CAPSULE | Freq: Every day | ORAL | 2 refills | Status: DC
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
## 2017-11-10 ENCOUNTER — Encounter (INDEPENDENT_AMBULATORY_CARE_PROVIDER_SITE_OTHER): Payer: Self-pay | Admitting: Internal Medicine

## 2017-11-10 ENCOUNTER — Ambulatory Visit: Payer: Medicaid Other | Admitting: Obstetrics and Gynecology

## 2017-11-10 ENCOUNTER — Encounter: Payer: Self-pay | Admitting: Obstetrics and Gynecology

## 2017-11-10 ENCOUNTER — Encounter (INDEPENDENT_AMBULATORY_CARE_PROVIDER_SITE_OTHER): Payer: Self-pay | Admitting: *Deleted

## 2017-11-10 ENCOUNTER — Ambulatory Visit (INDEPENDENT_AMBULATORY_CARE_PROVIDER_SITE_OTHER): Payer: Medicaid Other | Admitting: Internal Medicine

## 2017-11-10 VITALS — BP 130/82 | HR 72 | Temp 98.3°F | Ht 66.0 in | Wt 204.5 lb

## 2017-11-10 VITALS — BP 120/90 | HR 105 | Ht 66.0 in | Wt 208.4 lb

## 2017-11-10 DIAGNOSIS — K219 Gastro-esophageal reflux disease without esophagitis: Secondary | ICD-10-CM | POA: Diagnosis not present

## 2017-11-10 DIAGNOSIS — N939 Abnormal uterine and vaginal bleeding, unspecified: Secondary | ICD-10-CM | POA: Diagnosis not present

## 2017-11-10 DIAGNOSIS — R1319 Other dysphagia: Secondary | ICD-10-CM

## 2017-11-10 DIAGNOSIS — R1310 Dysphagia, unspecified: Secondary | ICD-10-CM | POA: Diagnosis not present

## 2017-11-10 MED ORDER — PANTOPRAZOLE SODIUM 40 MG PO TBEC: 40.0000 mg | DELAYED_RELEASE_TABLET | Freq: Every day | ORAL | 3 refills | Status: DC

## 2017-11-10 NOTE — Progress Notes (Signed)
Subjective:    Patient ID: Penny [name], female    DOB: 1986/07/21, 31 y.o.   MRN: [id number] Wt 201 on 09/29/2017 HPI Presents today with c/o dysphagia and epigastric pain. Symptoms x 3 months.  Given an Rx by Dr. Lurena Joiner for Amoxicillin BID x 10 days and Diflucan x 1 for viral syndrome.  Per records, started on amoxicillin (Pill) and caused her nausea. This occurred in October.  She states she is having dysphagia. No foods in particular cause her dysphagia. Dysphagia occurs daily She is having severe abdominal pain.    She is not on a PPI at this time. She states she is having acid reflux.  Her appetite is okay.   Weight in January was 215. Today her weight is 204.5. She states she is trying to lose weight.  Denies any fever.  She is having a BM every day and takes a stool softener. She says her stools are still hard.    EGD in January o this year.  of this year revealed.  Impression:               - Normal esophagus.                           - POSSIBLE TORSION OF GASTRIC REMNANT                           - Normal examined jejunum.  Gastric bypass in 2007   Review of Systems Past Medical History:  Diagnosis Date  . ADD (attention deficit disorder)   . Anemia 2011   2o to GASTRIC BYPASS  . Anginal pain ([place])   . Anxiety   . Blood transfusion without reported diagnosis   . Chronic daily headache   . Depression   . Depression   . Dysmenorrhea 09/18/2013  . Dysrhythmia   . Elevated liver enzymes JUL 2011ALK PHOS 111-127 AST  143-267 ALT  213-321T BILI 0.6  ALB  3.7-4.06 Jun 2011 ALK PHOS 118 AST 24 ALT 42 T BILI 0.4 ALB 3.9  . Encounter for drug rehabilitation    at behavioral health for opioid addiction about 4 years ago  . Family history of adverse reaction to anesthesia    'dad had to be kept on pump for breathing for morphine'  . Fatty liver   . Fibroid 01/18/2017  . Fibromyalgia   . Gastric bypass status for obesity   . Gastritis JULY 2011  . Heart murmur   .  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 History of Holter monitoring   . Hx of opioid abuse    for about 4 years, about 4 years ago  . Interstitial cystitis   . Iron deficiency anemia 07/23/2010  . Irritable bowel syndrome 2012 DIARRHEA   JUN 2012 TTG IgA 14.9  . IUD FEB 2010  . Lupus   . Menorrhagia 07/18/2013  . Migraines   . Obesity (BMI 30-39.9) 2011 228 LBS BMI 36.8  . Ovarian cyst   . Patient desires pregnancy 09/18/2013  . Polyneuropathy   . PONV (postoperative nausea and vomiting) seizure post-operatively  . Potassium (K) deficiency   . Pregnant 12/25/2013  . Psychiatric pseudoseizure   . RLQ abdominal pain 07/18/2013  . Sciatica of left side 11/14/2014  . 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 Stress 09/18/2013    Past Surgical History:  Procedure Laterality Date  . BIOPSY  04/10/2015   Procedure: BIOPSY;  Surgeon: Daneil Dolin, MD;  Location: [facility];  Service: Endoscopy;;  . cath self every nite     for sodium bicarb injection (discontinued 2013)  . CHOLECYSTECTOMY  2005   biliary dyskinesia  . COLONOSCOPY  JUN 2012 ABD PN/DIARRHEA WITH PROPOFOL   NL COLON  . DILATION AND CURETTAGE OF UTERUS    . DILITATION & CURRETTAGE/HYSTROSCOPY WITH NOVASURE ABLATION N/A 03/24/2017   Procedure: DILATATION & CURETTAGE/HYSTEROSCOPY WITH NOVASURE ENDOMETRIAL ABLATION;  Surgeon: Jonnie Kind, MD;  Location: [facility];  Service: Gynecology;  Laterality: N/A;  . ESOPHAGEAL DILATION N/A 04/10/2015   Procedure: ESOPHAGEAL DILATION WITH 54FR MALONEY DILATOR;  Surgeon: Daneil Dolin, MD;  Location: [facility];  Service: Endoscopy;  Laterality: N/A;  . ESOPHAGOGASTRODUODENOSCOPY    . ESOPHAGOGASTRODUODENOSCOPY (EGD) WITH PROPOFOL N/A 04/10/2015   Procedure: ESOPHAGOGASTRODUODENOSCOPY (EGD) WITH PROPOFOL;  Surgeon: Daneil Dolin, MD;  Location: [facility];  Service: Endoscopy;  Laterality: N/A;  . ESOPHAGOGASTRODUODENOSCOPY (EGD) WITH PROPOFOL N/A 12/06/2016   Procedure:  ESOPHAGOGASTRODUODENOSCOPY (EGD) WITH PROPOFOL;  Surgeon: Danie Binder, MD;  [facility];  Service: Endoscopy;  Laterality: N/A;  . GAB  2007   in [place]-POUCH 5 CM  . GASTRIC BYPASS  06/2006  . HYSTEROSCOPY W/D&C N/A 09/12/2014   Procedure: DILATATION AND CURETTAGE /HYSTEROSCOPY;  Surgeon: Jonnie Kind, MD;  Location: [facility];  Service: Gynecology;  Laterality: N/A;  . LAPAROSCOPIC TUBAL LIGATION Bilateral 03/24/2017   Procedure: LAPAROSCOPIC TUBAL LIGATION ([place] Rings);  Surgeon: Jonnie Kind, MD;  Location: [facility];  Service: Gynecology;  Laterality: Bilateral;  . REPAIR VAGINAL CUFF N/A 07/30/2014   Procedure: REPAIR VAGINAL CUFF;  Surgeon: Mora Bellman, MD;  Location: [facility];  Service: Gynecology;  Laterality: N/A;  . SAVORY DILATION  06/20/2012   Dr. Barnie Alderman gastritis/Ulcer in the mid jejunum. Empiric dilation.   . SURAL NERVE BX Left 02/25/2016   Procedure: LEFT SURAL NERVE BIOPSY;  Surgeon: Jovita Gamma, MD;  Location: [place] [facility];  Service: Neurosurgery;  Laterality: Left;  Left sural nerve biopsy  . TONSILLECTOMY    . TONSILLECTOMY AND ADENOIDECTOMY    . UPPER GASTROINTESTINAL ENDOSCOPY  JULY 2011 NAUSEA-D125,V6, PH 25   Bx; GASTRITIS, POUCH-5 CM LONG  . WISDOM TOOTH EXTRACTION      Allergies  Allergen Reactions  . Gabapentin Other (See Comments)    Pt states that she was unresponsive after taking this medication, but her vitals remained stable.    . Metoclopramide Hcl Anxiety and Other (See Comments)    Pt states that she felt like she was trapped in a box, and could not get out.  Pt also states that she had temporary loss of movement, weakness, and tingling.    . Tramadol Other (See Comments)    Seizures  . Nucynta [Tapentadol]     vomiting  . Propofol Nausea And Vomiting  . Trazodone And Nefazodone Other (See Comments)    Makes pt "like a zombie"  . Zofran [Ondansetron] Other (See Comments)    Migraines   . Butrans [Buprenorphine]      Patch caused rash, didn't help with pain  . Toradol [Ketorolac Tromethamine] Other (See Comments)    anxiety  . Latex Rash  . Lyrica [Pregabalin]     suicidal  . Tape Itching and Rash    Please use paper tape    Current Outpatient Medications on File Prior to  Visit  Medication Sig Dispense Refill  . amphetamine-dextroamphetamine (ADDERALL XR) 30 MG 24 hr capsule Take 1 capsule (30 mg total) by mouth every morning. 30 capsule 0  . carbamazepine (TEGRETOL) 200 MG tablet Take two tablets at bedtime 60 tablet 2  . clonazePAM (KLONOPIN) 0.5 MG tablet Take 1 tablet (0.5 mg total) by mouth 3 (three) times daily as needed for anxiety. 90 tablet 2  . DULoxetine (CYMBALTA) 60 MG capsule Take 2 capsules (120 mg total) by mouth at bedtime. 60 capsule 2  . HYDROcodone Bitartrate ER (HYSINGLA ER) 20 MG T24A Take 40 mg by mouth at bedtime.     Marland Kitchen oxyCODONE (ROXICODONE) 15 MG immediate release tablet Take 15 mg by mouth every 4 (four) hours as needed for pain.    . Pediatric Multivitamins-Iron (FLINTSTONES PLUS IRON PO) Take 1 tablet by mouth 2 (two) times daily.     . phenazopyridine (PYRIDIUM) 100 MG tablet TAKE (1) TABLET BY MOUTH (3) TIMES A DAY AS NEEDED FOR PAIN. 21 tablet 0  . PROAIR HFA 108 (90 Base) MCG/ACT inhaler INHALE 2 PUFFS INTO THE LUNGS EVERY 6 HOURS AS NEEDED. 8.5 g 0  . promethazine (PHENERGAN) 25 MG tablet Take 1 tablet (25 mg total) by mouth every 6 (six) hours as needed. 30 tablet 0  . topiramate (TOPAMAX) 50 MG tablet TAKE 1 TABLET BY MOUTH EACH MORNING AND 2 TABLETS AT BEDTIME. 90 tablet 2   No current facility-administered medications on file prior to visit.         Objective:   Physical Exam Blood pressure 130/82, pulse 72, temperature 98.3 F (36.8 C), height '5\' 6"'$  (1.676 m), weight 204 lb 8 oz (92.8 kg). Alert and oriented. Skin warm and dry. Oral mucosa is moist.   . Sclera anicteric, conjunctivae is pink. Thyroid not enlarged. No cervical lymphadenopathy. Lungs clear.  Heart regular rate and rhythm.  Abdomen is soft. Bowel sounds are positive. No hepatomegaly. No abdominal masses felt. No tenderness.  No edema to lower extremities.           Assessment & Plan:  Nausea,epigastric pain. Dysphagia. Am going to get an Esophagram. GERD: Rx for Protonix sent to her pharmacy.

## 2017-11-10 NOTE — Progress Notes (Signed)
[facility] Visit  11/10/2017            Patient name: Penny [name] MRN [id number]  Date of birth: 16-Aug-1986  CC & HPI:  Penny [name] is a 31 y.o. female presenting today for a f/u appointment, stating she is still having abnormal vaginal bleeding with onset of since her tubal and ablation in April 2018. She states she spots constantly every day, and her periods occur once a month and are very heavy. She has associated symptoms of odor with her bleeding, and sluggishness, and migraines. She previously tried Megace with no relief. No alleviating factors noted. She goes through 3 pads a day. She says she saw NP Deberah Castle, who has sent her for blood work. She states her migraines occur about 3 days a week, and are worse on the days she has her period. Patient's last menstrual period was 11/07/2017 (approximate). Examination today shows absolutely no bleeding per vagina she does have atrophic tissues with some perineal laxity I cannot find anything to corroborate her symptoms of bleeding  ROS:  ROS (+)vaginal spotting (+)lethargy (+)migraines  Pertinent History Reviewed:   Reviewed: Significant Medical        Past Medical History:  Diagnosis Date  . ADD (attention deficit disorder)   . Anemia 2011   2o to GASTRIC BYPASS  . Anginal pain ([place])   . Anxiety   . Blood transfusion without reported diagnosis   . Chronic daily headache   . Depression   . Depression   . Dysmenorrhea 09/18/2013  . Dysrhythmia   . Elevated liver enzymes JUL 2011ALK PHOS 111-127 AST  143-267 ALT  213-321T BILI 0.6  ALB  3.7-4.06 Jun 2011 ALK PHOS 118 AST 24 ALT 42 T BILI 0.4 ALB 3.9  . Encounter for drug rehabilitation    at behavioral health for opioid addiction about 4 years ago  . Family history of adverse reaction to anesthesia    'dad had to be kept on pump for breathing for morphine'  . Fatty liver   . Fibroid 01/18/2017  . Fibromyalgia   . Gastric bypass status for obesity   .  Gastritis JULY 2011  . Heart murmur   . 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 History of Holter monitoring   . Hx of opioid abuse    for about 4 years, about 4 years ago  . Interstitial cystitis   . Iron deficiency anemia 07/23/2010  . Irritable bowel syndrome 2012 DIARRHEA   JUN 2012 TTG IgA 14.9  . IUD FEB 2010  . Lupus   . Menorrhagia 07/18/2013  . Migraines   . Obesity (BMI 30-39.9) 2011 228 LBS BMI 36.8  . Ovarian cyst   . Patient desires pregnancy 09/18/2013  . Polyneuropathy   . PONV (postoperative nausea and vomiting) seizure post-operatively  . Potassium (K) deficiency   . Pregnant 12/25/2013  . Psychiatric pseudoseizure   . RLQ abdominal pain 07/18/2013  . Sciatica of left side 11/14/2014  . 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 Stress 09/18/2013                              Surgical Hx:    Past Surgical History:  Procedure Laterality Date  . BIOPSY  04/10/2015   Procedure: BIOPSY;  Surgeon: Daneil Dolin, MD;  Location: [facility];  Service: Endoscopy;;  . cath self  every nite     for sodium bicarb injection (discontinued 2013)  . CHOLECYSTECTOMY  2005   biliary dyskinesia  . COLONOSCOPY  JUN 2012 ABD PN/DIARRHEA WITH PROPOFOL   NL COLON  . DILATION AND CURETTAGE OF UTERUS    . DILITATION & CURRETTAGE/HYSTROSCOPY WITH NOVASURE ABLATION N/A 03/24/2017   Procedure: DILATATION & CURETTAGE/HYSTEROSCOPY WITH NOVASURE ENDOMETRIAL ABLATION;  Surgeon: Jonnie Kind, MD;  Location: [facility];  Service: Gynecology;  Laterality: N/A;  . ESOPHAGEAL DILATION N/A 04/10/2015   Procedure: ESOPHAGEAL DILATION WITH 54FR MALONEY DILATOR;  Surgeon: Daneil Dolin, MD;  Location: [facility];  Service: Endoscopy;  Laterality: N/A;  . ESOPHAGOGASTRODUODENOSCOPY    . ESOPHAGOGASTRODUODENOSCOPY (EGD) WITH PROPOFOL N/A 04/10/2015   Procedure: ESOPHAGOGASTRODUODENOSCOPY (EGD) WITH PROPOFOL;  Surgeon: Daneil Dolin, MD;  [facility];  Service: Endoscopy;  Laterality: N/A;   . ESOPHAGOGASTRODUODENOSCOPY (EGD) WITH PROPOFOL N/A 12/06/2016   Procedure: ESOPHAGOGASTRODUODENOSCOPY (EGD) WITH PROPOFOL;  Surgeon: Danie Binder, MD;  [facility];  Service: Endoscopy;  Laterality: N/A;  . GAB  2007   in [place]-POUCH 5 CM  . GASTRIC BYPASS  06/2006  . HYSTEROSCOPY W/D&C N/A 09/12/2014   Procedure: DILATATION AND CURETTAGE /HYSTEROSCOPY;  Surgeon: Jonnie Kind, MD;  Location: [facility];  Service: Gynecology;  Laterality: N/A;  . LAPAROSCOPIC TUBAL LIGATION Bilateral 03/24/2017   Procedure: LAPAROSCOPIC TUBAL LIGATION ([place] Rings);  Surgeon: Jonnie Kind, MD;  Location: [facility];  Service: Gynecology;  Laterality: Bilateral;  . REPAIR VAGINAL CUFF N/A 07/30/2014   Procedure: REPAIR VAGINAL CUFF;  Surgeon: Mora Bellman, MD;  Location: [facility];  Service: Gynecology;  Laterality: N/A;  . SAVORY DILATION  06/20/2012   Dr. Barnie Alderman gastritis/Ulcer in the mid jejunum. Empiric dilation.   . SURAL NERVE BX Left 02/25/2016   Procedure: LEFT SURAL NERVE BIOPSY;  Surgeon: Jovita Gamma, MD;  Location: [place] [facility];  Service: Neurosurgery;  Laterality: Left;  Left sural nerve biopsy  . TONSILLECTOMY    . TONSILLECTOMY AND ADENOIDECTOMY    . UPPER GASTROINTESTINAL ENDOSCOPY  JULY 2011 NAUSEA-D125,V6, PH 25   Bx; GASTRITIS, POUCH-5 CM LONG  . WISDOM TOOTH EXTRACTION     Medications: Reviewed & Updated - see associated section                       Current Outpatient Medications:  .  amphetamine-dextroamphetamine (ADDERALL XR) 30 MG 24 hr capsule, Take 1 capsule (30 mg total) by mouth every morning., Disp: 30 capsule, Rfl: 0 .  carbamazepine (TEGRETOL) 200 MG tablet, Take two tablets at bedtime, Disp: 60 tablet, Rfl: 2 .  clonazePAM (KLONOPIN) 0.5 MG tablet, Take 1 tablet (0.5 mg total) by mouth 3 (three) times daily as needed for anxiety., Disp: 90 tablet, Rfl: 2 .  DULoxetine (CYMBALTA) 60 MG capsule, Take 2 capsules (120 mg total) by mouth at bedtime., Disp: 60  capsule, Rfl: 2 .  HYDROcodone Bitartrate ER (HYSINGLA ER) 20 MG T24A, Take 40 mg by mouth at bedtime. , Disp: , Rfl:  .  oxyCODONE (ROXICODONE) 15 MG immediate release tablet, Take 15 mg by mouth every 4 (four) hours as needed for pain., Disp: , Rfl:  .  pantoprazole (PROTONIX) 40 MG tablet, Take 1 tablet (40 mg total) by mouth daily., Disp: 90 tablet, Rfl: 3 .  Pediatric Multivitamins-Iron (FLINTSTONES PLUS IRON PO), Take 1 tablet by mouth 2 (two) times daily. , Disp: , Rfl:  .  phenazopyridine (PYRIDIUM)  100 MG tablet, TAKE (1) TABLET BY MOUTH (3) TIMES A DAY AS NEEDED FOR PAIN., Disp: 21 tablet, Rfl: 0 .  PROAIR HFA 108 (90 Base) MCG/ACT inhaler, INHALE 2 PUFFS INTO THE LUNGS EVERY 6 HOURS AS NEEDED., Disp: 8.5 g, Rfl: 0 .  promethazine (PHENERGAN) 25 MG tablet, Take 1 tablet (25 mg total) by mouth every 6 (six) hours as needed., Disp: 30 tablet, Rfl: 0 .  topiramate (TOPAMAX) 50 MG tablet, TAKE 1 TABLET BY MOUTH EACH MORNING AND 2 TABLETS AT BEDTIME., Disp: 90 tablet, Rfl: 2   Social History: Reviewed -  reports that she has been smoking cigarettes.  She has a 1.50 pack-year smoking history. she has never used smokeless tobacco.  Objective Findings:  Vitals: Blood pressure 120/90, pulse (!) 105, height '5\' 6"'$  (1.676 m), weight 208 lb 6.4 oz (94.5 kg), last menstrual period 11/07/2017.  Physical Examination: General appearance - alert, well appearing, and in no distress Mental status - alert, oriented to person, place, and time Pelvic -  VULVA: normal appearing vulva with no masses, tenderness or lesions,  VAGINA: normal appearing vagina with normal color and discharge, no lesions,  CERVIX: normal appearing cervix without discharge or lesions,  UTERUS: uterus is normal size, shape, consistency and nontender,  ADNEXA: normal adnexa in size, nontender and no masses   Assessment & Plan:   A: Patient complaints of daily bleeding unable be corroborated 1. No evidence blood per vagina 2.   Patient reports history of? Menstrual "migraine"  P: Patient reports she will have a heavy menses in 2 weeks and will reexamine the see if there is any correlation between exam and patient complaints 1. f/u in 2 weeks for gyn f/u   By signing my name below, I, Izna Ahmed, attest that this documentation has been prepared under the direction and in the presence of Jonnie Kind, MD. Electronically Signed: Jabier Gauss, Medical Scribe. 11/10/17. 4:28 PM. I personally performed the services described in this documentation, which was SCRIBED in my presence. The recorded information has been reviewed and considered accurate. It has been edited as necessary during review. Jonnie Kind, MD

## 2017-11-10 NOTE — Progress Notes (Signed)
   Subjective:    Patient ID: Penny [name], female    DOB: 1986-03-06, 31 y.o.   MRN: [id number] Wt 201 on 09/29/2017 HPI Presents today with c/o dysphagia and epigastric pain. Symptoms x 3 months.  Given an Rx by Dr. Lurena Joiner for Amoxicillin BID x 10 days and Diflucan x 1 for viral syndrome.  Per records, started on amoxicillin (Pill) and caused her nausea. This occurred in October.  She states she is having dysphagia. No foods in particular cause her dysphagia. She is having severe abdominal pain.    She is not on a PPI at this time.  Her appetite is okay.   Weight in January was 215. Today her weight is 204.5. She states she is trying to lose weight. Denies any fever.  She is having a BM every day and takes a stool softener. She says her stools are still hard.    EGD in January o this year.  of this year revealed.  Impression:               - Normal esophagus.                           - POSSIBLE TORSION OF GASTRIC REMNANT                           - Normal examined jejunum.  Gastric bypass in 2007   Review of Systems     Objective:   Physical Exam        Assessment & Plan:  Nausea.

## 2017-11-10 NOTE — Patient Instructions (Signed)
Rx for Protonix sent to her pharmacy. DG esophagram.

## 2017-11-14 IMAGING — MR MR LUMBAR SPINE W/O CM
4 of 5 series · 18 of 48 positions shown · non-contrast
Comparison: Prior study from 12/30/2014.

CLINICAL DATA: Initial evaluation for bilateral lower extremity
numbness.

EXAM:
MRI LUMBAR SPINE WITHOUT CONTRAST
TECHNIQUE: Multiplanar, multisequence MR imaging of the lumbar spine was
performed. No intravenous contrast was administered.

[Series 3: T2 · sagittal · 4.0mm · 0.55mm/px · 6 of 15 slices shown (1 of 2)]
[im 1/15]
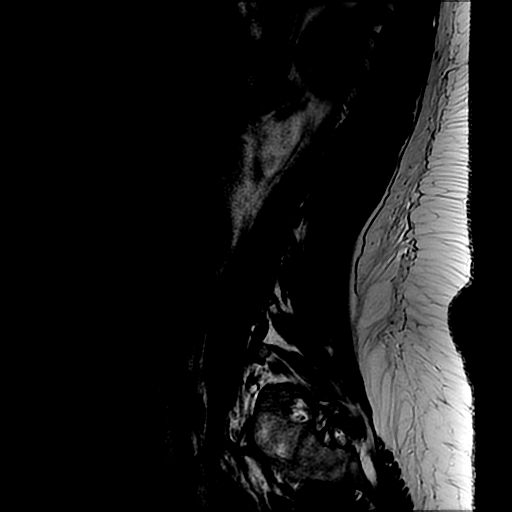
[im 3/15]
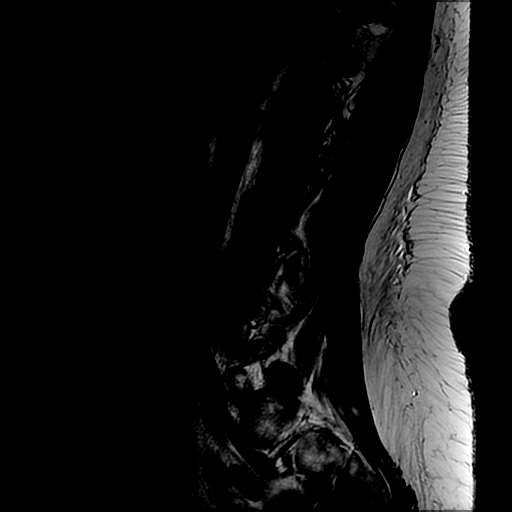
[im 6/15]
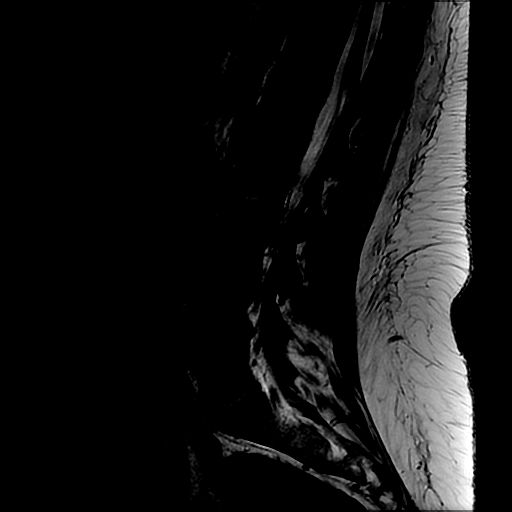
[im 9/15]
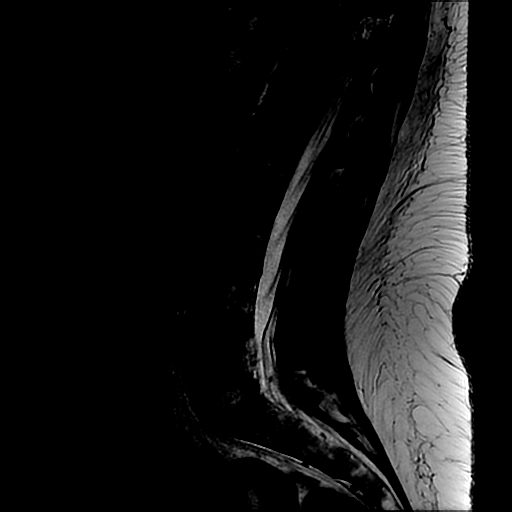
[im 12/15]
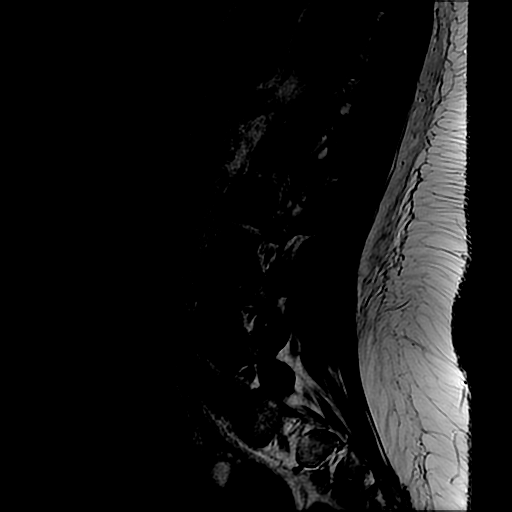
[im 15/15]
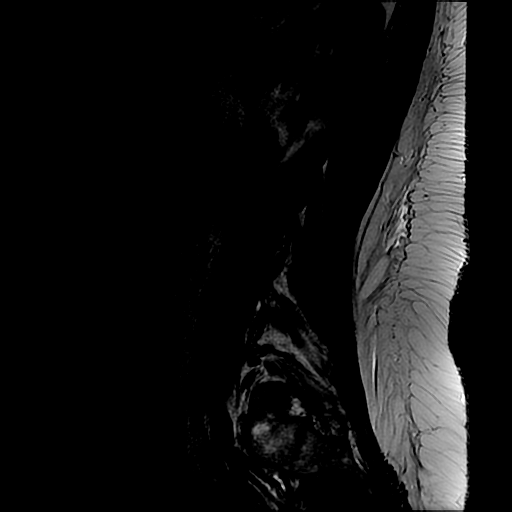

[Series 4: T1 · sagittal · 4.0mm · 0.55mm/px · 3 of 15 slices shown (1 of 2)]
[im 3/15]
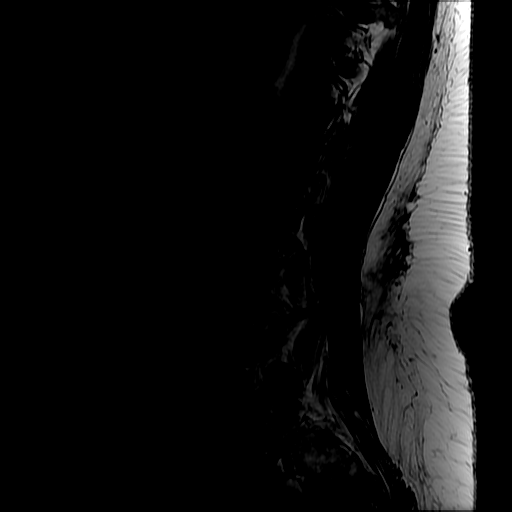
[im 9/15]
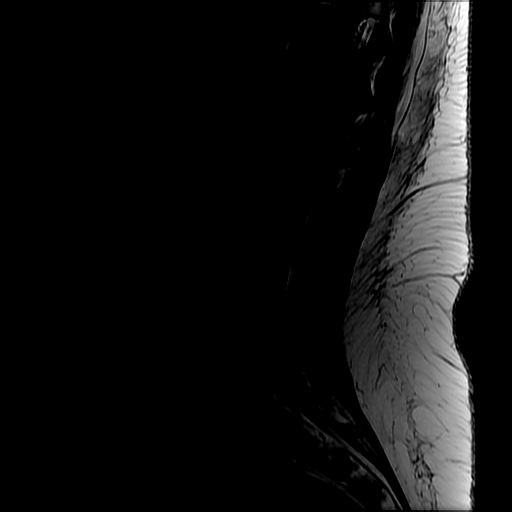
[im 15/15]
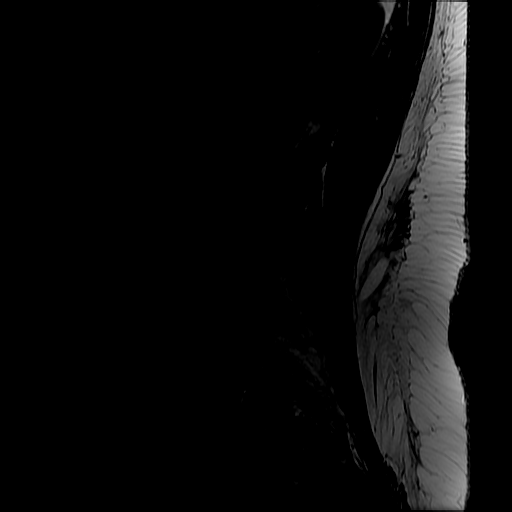

[Series 6: T2 · axial · 4.0mm · 0.39mm/px · z∈[-120,+53]mm · 6 of 39 slices shown (2 of 2)]
[im 1/39]
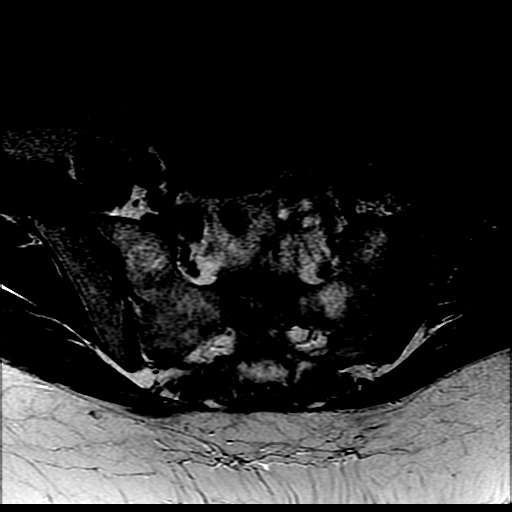
[im 6/39]
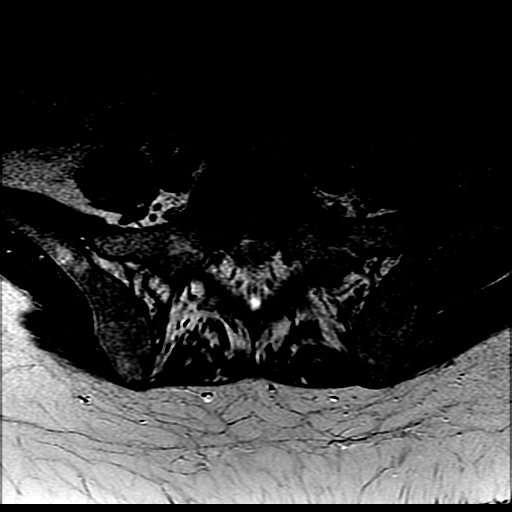
[im 11/39]
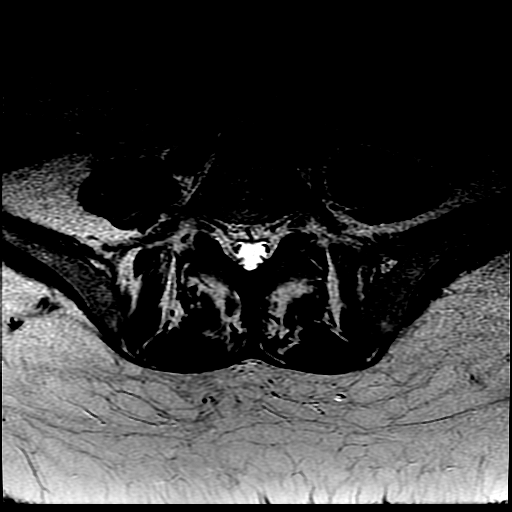
[im 17/39]
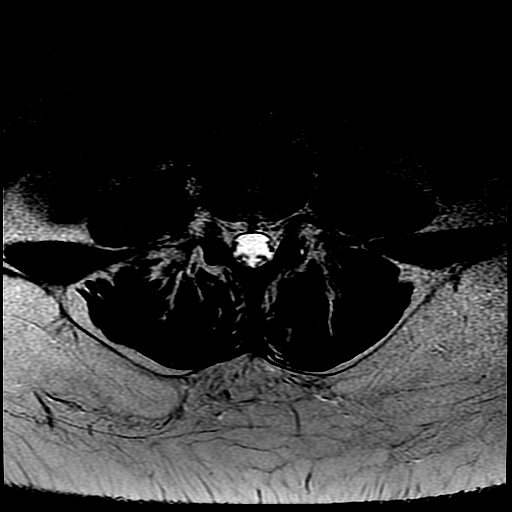
[im 20/39]
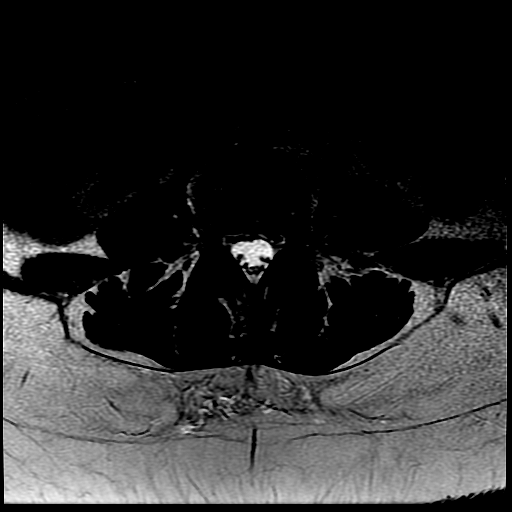
[im 33/39]
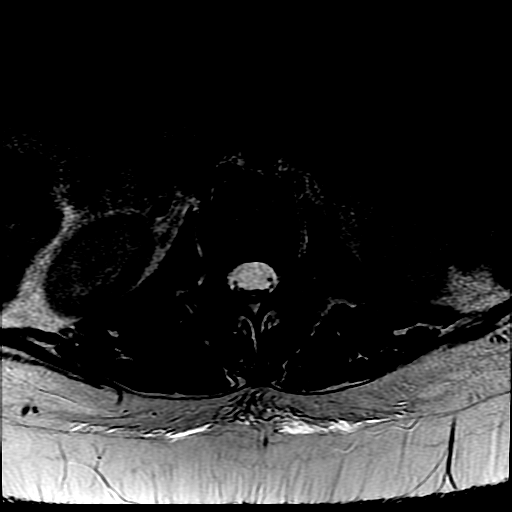

[Series 7: T1 · axial · 4.0mm · 0.39mm/px · z∈[-95,+53]mm · 3 of 39 slices shown (2 of 2)]
[im 6/39]
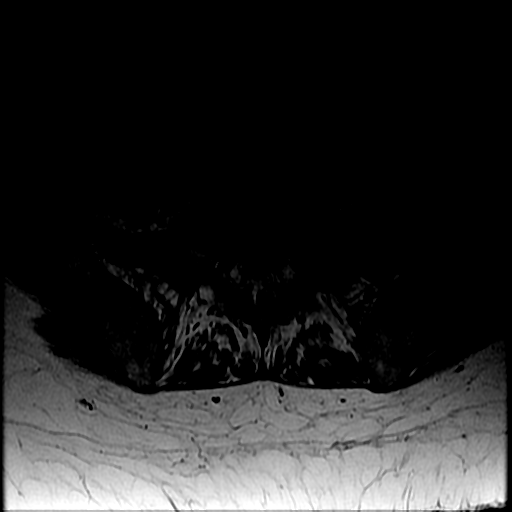
[im 20/39]
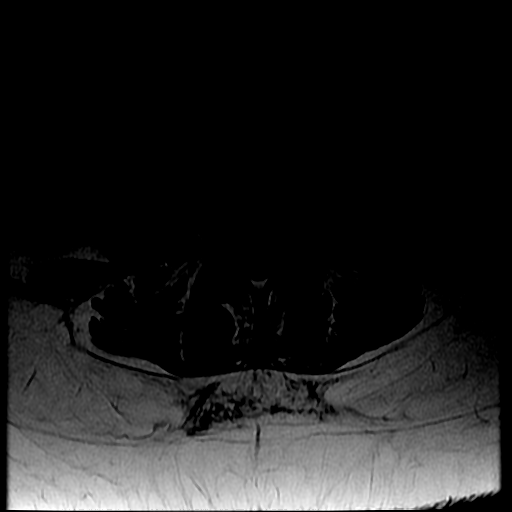
[im 33/39]
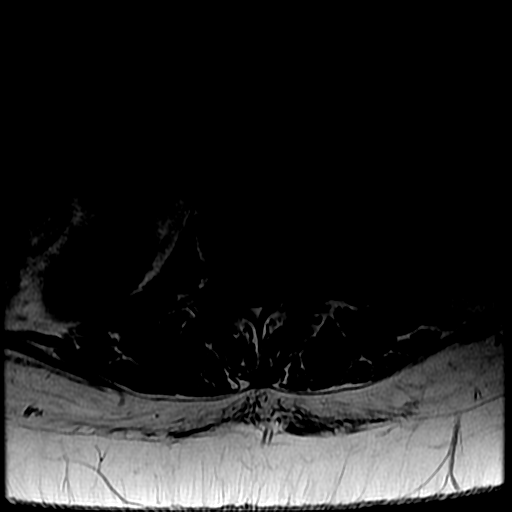

[18 of 48 positions shown; findings below may reference images not displayed]

FINDINGS: For the purposes of this dictation, the lowest well-formed
intervertebral disc spaces presumed to be the L5-S1 level, and there
presumed to be 5 lumbar type vertebral bodies.

Vertebral bodies are normally aligned with preservation of the
normal lumbar lordosis. Vertebral body heights are well maintained.
No fracture or listhesis. Signal intensity within the vertebral body
bone marrow is normal. No focal osseous lesions. No marrow edema.

Conus medullaris terminates normally at the L1 level. Signal
intensity within the visualized cord is normal. Nerve roots of the
cauda equina are unremarkable.

Paraspinous soft tissues demonstrate no acute abnormality.

T10-11:  Negative.

T12-L1:  Negative.

L1-2:  Negative.

L2-3:  Negative interspace.  Minimal facet hypertrophy.

L3-4: Disc desiccation with small right paracentral disc protrusion
indents and mildly flattens the right ventral thecal sac without
significant stenosis or evidence of neural impingement. Overall, the
size of the protruding disc is slightly decreased relative to the
previous study P There is an associated annular fissure. Mild
ligamentum and facet hypertrophy. Foramina are widely patent.

L4-5: Diffuse disc desiccation with shallow right paracentral disc
protrusion. The protruding disc minimally indents the right ventral
thecal sac without significant stenosis or evidence of neural
impingement. Overall, size of the disc is not significantly changed
relative to previous. Mild facet and ligamentum flavum hypertrophy.
No significant canal or foraminal stenosis.

L5-S1: No disc bulge or disc protrusion. Mild bilateral facet
arthrosis. No significant canal or foraminal stenosis.
IMPRESSION: 1. Stable appearance of the lumbar spine. No evidence of severe
stenosis or cord compression. No new disc herniation.
2. Small right paracentral disc protrusion at L3-4 without stenosis
or neural impingement.
3. Small right paracentral disc protrusion at L4-5 without stenosis
or evidence of neural impingement.

## 2017-11-15 ENCOUNTER — Ambulatory Visit (HOSPITAL_COMMUNITY): Admission: RE | Admit: 2017-11-15 | Payer: Medicaid Other | Source: Ambulatory Visit

## 2017-11-15 IMAGING — MR MR HEAD W/O CM
10 of 11 series · 35 of 48 positions shown · non-contrast
Comparison: Head CT 11/15/2015 and MRI 08/14/2014

CLINICAL DATA: Lower extremity pain, numbness, and weakness.
Initial symptoms involving the left lower extremity after an
epidural during childbirth in [DATE], with symptoms slowly
improving. Fall in [DATE] with progressive weakness and numbness in
the left lower extremity. Multiple recent falls. Weakness and
numbness in the right lower extremity now as well. Upper extremity
paresthesias in the hands. Left-sided facial numbness for months.

EXAM:
MRI HEAD WITHOUT CONTRAST
TECHNIQUE: Multiplanar, multiecho pulse sequences of the brain and surrounding
structures were obtained without intravenous contrast.

[Series 3: DWI · axial · 3.0mm · 1.09mm/px · z∈[-47,+90]mm · 8 of 94 slices shown (1 of 4)]
[im 1/94]
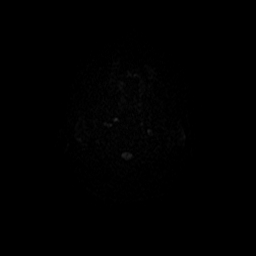
[im 11/94]
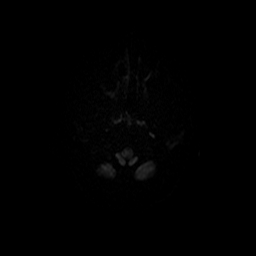
[im 32/94]
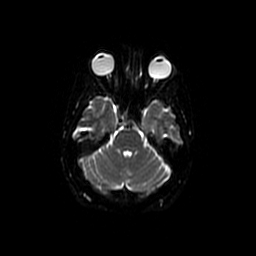
[im 42/94]
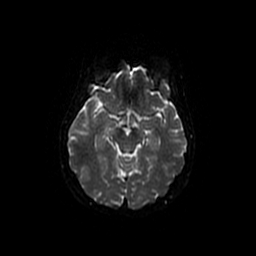
[im 52/94]
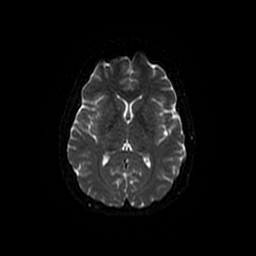
[im 63/94]
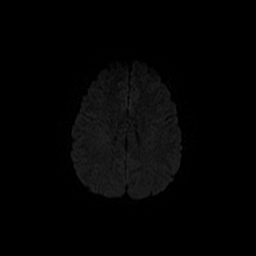
[im 83/94]
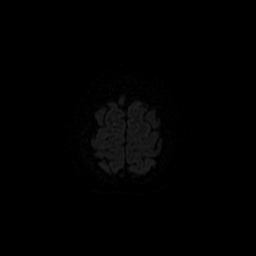
[im 94/94]
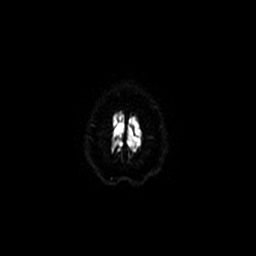

[Series 4: T1 · sagittal · 5.0mm · 0.47mm/px · 2 of 23 slices shown]
[im 1/23]
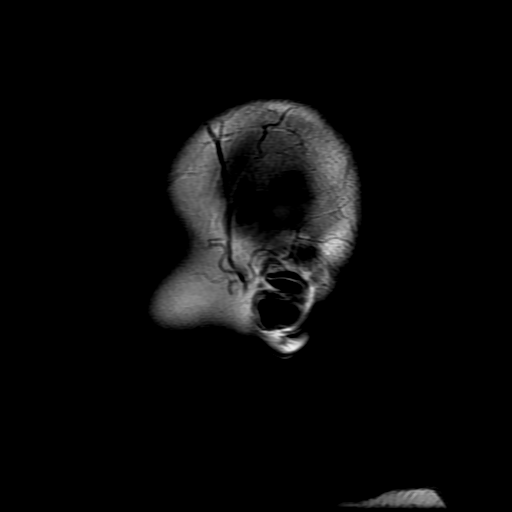
[im 23/23]
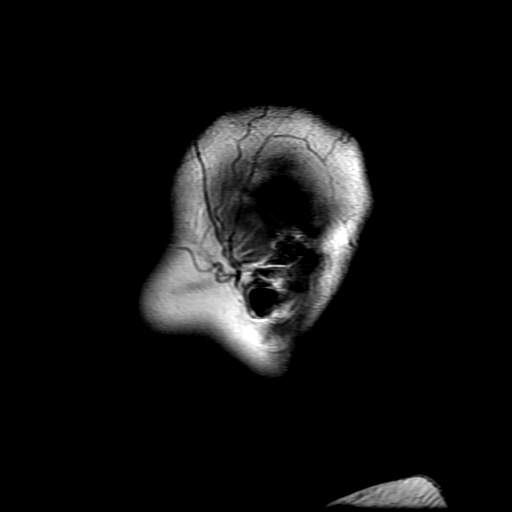

[Series 5: DWI · coronal · 5.0mm · 1.09mm/px · 7 of 66 slices shown (2 of 4)]
[im 1/66]
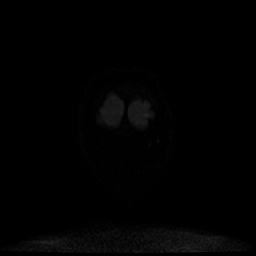
[im 11/66]
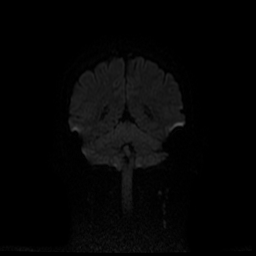
[im 22/66]
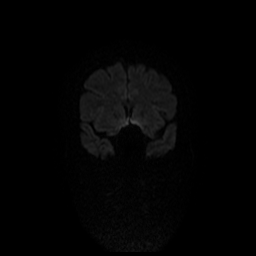
[im 33/66]
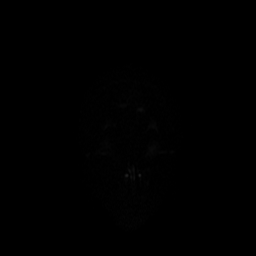
[im 44/66]
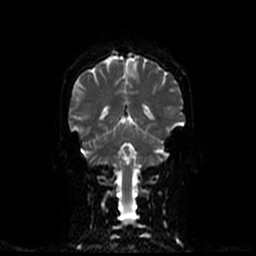
[im 55/66]
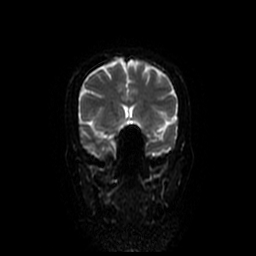
[im 66/66]
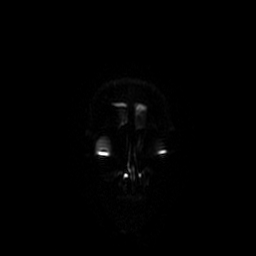

[Series 6: T2 · axial · 5.0mm · 0.43mm/px · z∈[-43,+94]mm · 2 of 24 slices shown (1 of 3)]
[im 1/24]
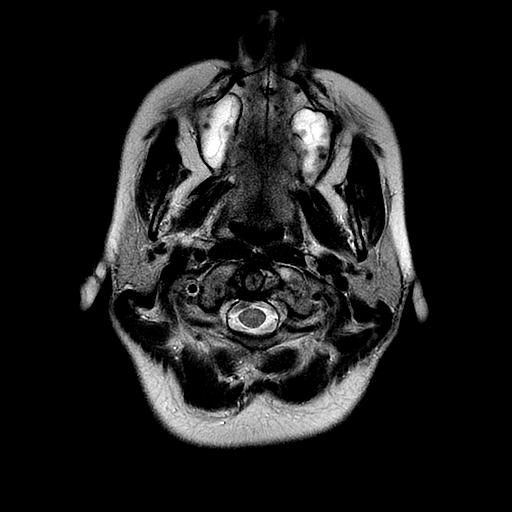
[im 24/24]
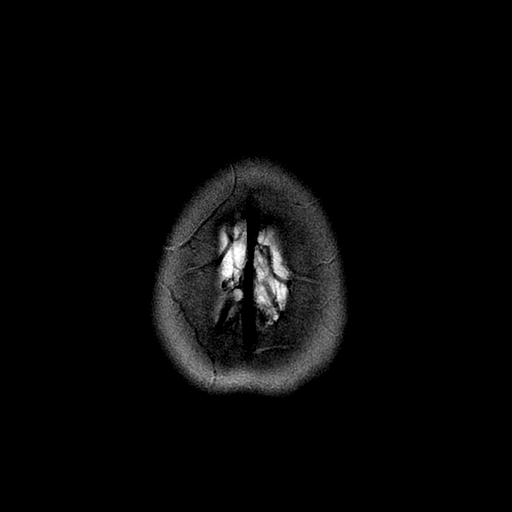

[Series 7: FLAIR · axial · 5.0mm · 0.43mm/px · z∈[-43,+94]mm · 2 of 24 slices shown]
[im 1/24]
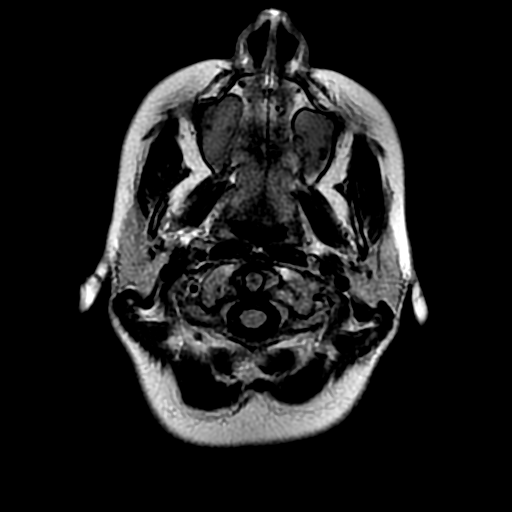
[im 24/24]
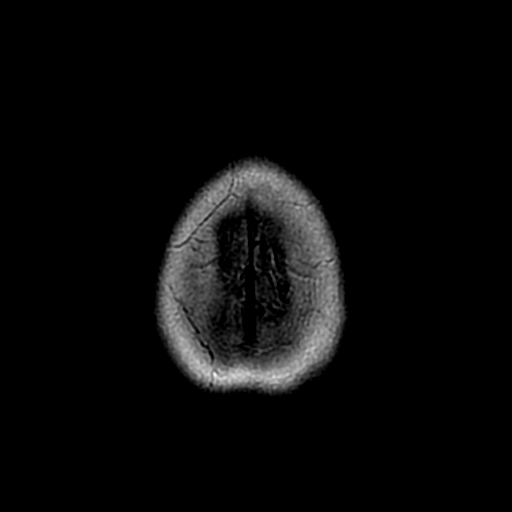

[Series 8: ax mpgr · axial · 5.0mm · 0.43mm/px · 1 of 24 slices shown]
[im 1/24]
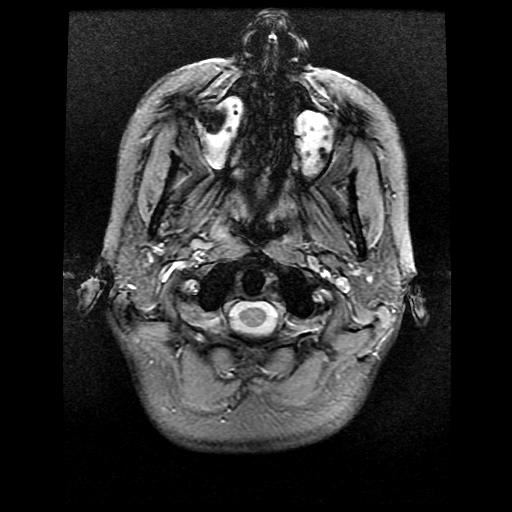

[Series 10: T2 · coronal · 5.0mm · 0.43mm/px · 3 of 28 slices shown (2 of 3)]
[im 1/28]
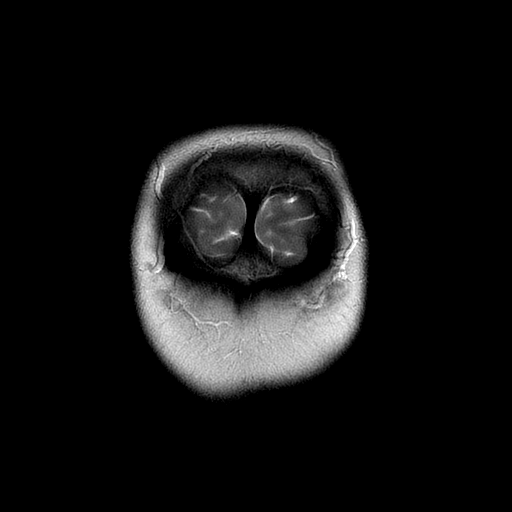
[im 14/28]
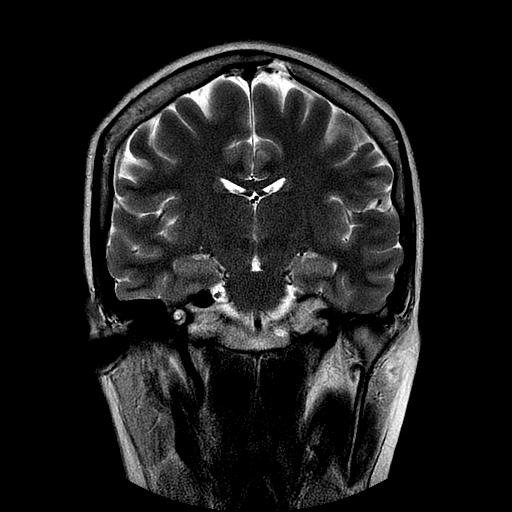
[im 28/28]
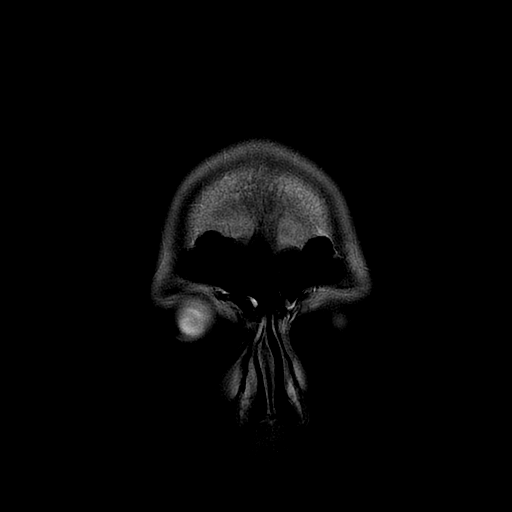

[Series 12: T2 · coronal · 3.0mm · 0.35mm/px · 2 of 24 slices shown (3 of 3)]
[im 1/24]
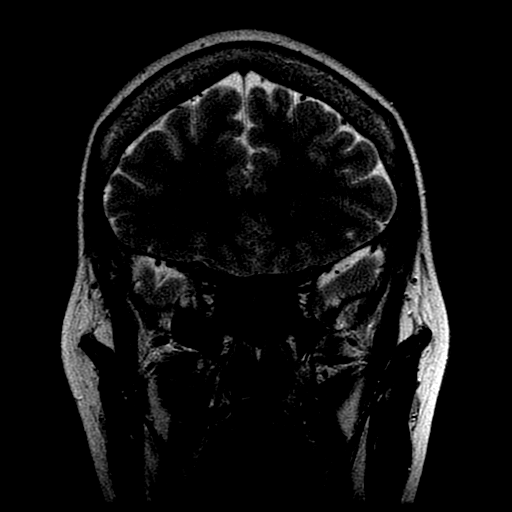
[im 24/24]
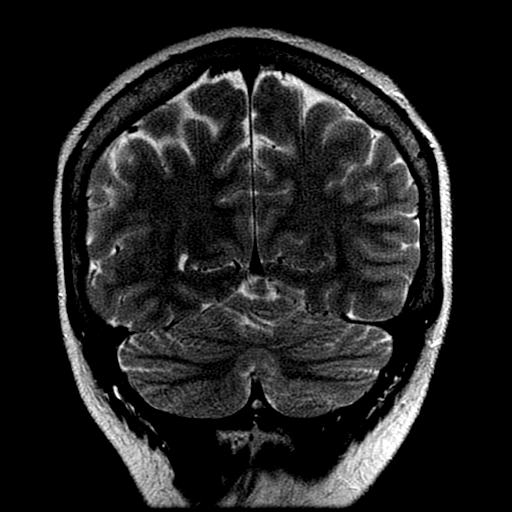

[Series 300: DWI · axial · 3.0mm · 1.09mm/px · z∈[-47,+90]mm · 5 of 47 slices shown (3 of 4)]
[im 1/47]
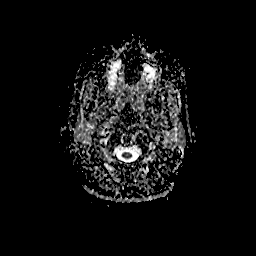
[im 12/47]
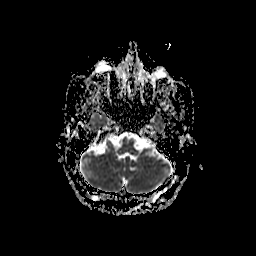
[im 24/47]
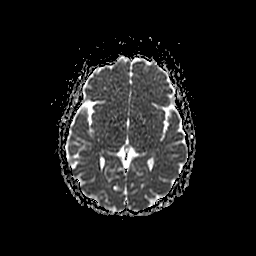
[im 35/47]
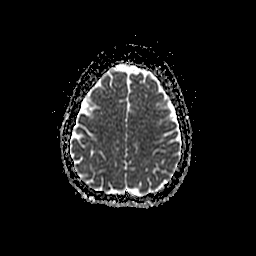
[im 47/47]
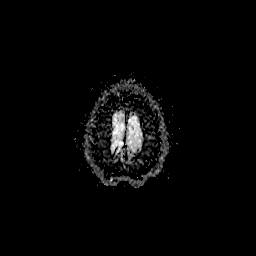

[Series 500: DWI · coronal · 5.0mm · 1.09mm/px · 3 of 33 slices shown (4 of 4)]
[im 1/33]
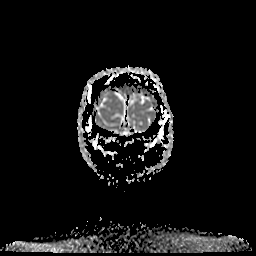
[im 17/33]
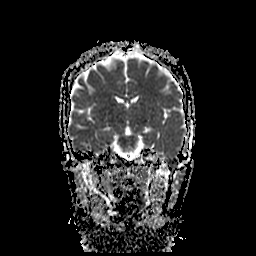
[im 33/33]
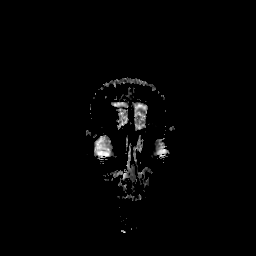

[35 of 48 positions shown; findings below may reference images not displayed]

FINDINGS: Dedicated thin section imaging through the temporal lobes
demonstrates normal volume and signal of the hippocampi. There is no
evidence of acute infarct, intracranial hemorrhage, mass, midline
shift, or extra-axial fluid collection. Ventricles and sulci are
normal. The brain parenchyma is normal in signal.

A 6 mm pineal cyst is noted. This demonstrates more uniform FLAIR
hyperintensity than on the prior MRI, however the gland is overall
at most minimally more prominent in size.

Orbits are unremarkable. Mild bilateral ethmoid and mild-to-moderate
bilateral maxillary sinus mucosal thickening is noted. A small
amount of fluid is noted in the right maxillary sinus. Mastoid air
cells are clear. Major intracranial vascular flow voids are
preserved.
IMPRESSION: 1. No acute intracranial abnormality.
2. 6 mm pineal cyst, otherwise unremarkable appearance of the brain.

## 2017-11-18 ENCOUNTER — Ambulatory Visit (HOSPITAL_COMMUNITY): Payer: Medicaid Other | Admitting: Psychiatry

## 2017-11-19 ENCOUNTER — Other Ambulatory Visit: Payer: Self-pay | Admitting: Family Medicine

## 2017-12-08 ENCOUNTER — Telehealth: Payer: Self-pay | Admitting: Family Medicine

## 2017-12-08 NOTE — Telephone Encounter (Signed)
Pt is needing a letter stating that she has been unable to work for the past three years due to her health. Please advise.

## 2017-12-11 ENCOUNTER — Encounter: Payer: Self-pay | Admitting: Family Medicine

## 2017-12-11 NOTE — Telephone Encounter (Signed)
A letter was dictated please forward to the patient thank you

## 2017-12-17 ENCOUNTER — Encounter (HOSPITAL_COMMUNITY): Payer: Self-pay | Admitting: *Deleted

## 2017-12-17 ENCOUNTER — Inpatient Hospital Stay (EMERGENCY_DEPARTMENT_HOSPITAL)
Admission: AD | Admit: 2017-12-17 | Discharge: 2017-12-17 | Disposition: A | Discharge status: Home / Self Care | Payer: Medicaid Other | Source: Ambulatory Visit | Attending: Obstetrics and Gynecology | Admitting: Obstetrics and Gynecology

## 2017-12-17 DIAGNOSIS — Z79899 Other long term (current) drug therapy: Secondary | ICD-10-CM | POA: Diagnosis not present

## 2017-12-17 DIAGNOSIS — F17210 Nicotine dependence, cigarettes, uncomplicated: Secondary | ICD-10-CM | POA: Diagnosis not present

## 2017-12-17 DIAGNOSIS — N760 Acute vaginitis: Secondary | ICD-10-CM

## 2017-12-17 DIAGNOSIS — N898 Other specified noninflammatory disorders of vagina: Secondary | ICD-10-CM

## 2017-12-17 DIAGNOSIS — N9089 Other specified noninflammatory disorders of vulva and perineum: Secondary | ICD-10-CM | POA: Diagnosis not present

## 2017-12-17 DIAGNOSIS — R102 Pelvic and perineal pain: Secondary | ICD-10-CM | POA: Diagnosis not present

## 2017-12-17 DIAGNOSIS — B9689 Other specified bacterial agents as the cause of diseases classified elsewhere: Secondary | ICD-10-CM

## 2017-12-17 LAB — URINALYSIS, ROUTINE W REFLEX MICROSCOPIC
Bacteria, UA: NONE SEEN
Bilirubin Urine: NEGATIVE
Glucose, UA: NEGATIVE mg/dL
HGB URINE DIPSTICK: NEGATIVE
Ketones, ur: NEGATIVE mg/dL
NITRITE: NEGATIVE
PH: 6 (ref 5.0–8.0)
Protein, ur: NEGATIVE mg/dL
SPECIFIC GRAVITY, URINE: 1.011 (ref 1.005–1.030)

## 2017-12-17 LAB — WET PREP, GENITAL
Sperm: NONE SEEN
Trich, Wet Prep: NONE SEEN
Yeast Wet Prep HPF POC: NONE SEEN

## 2017-12-17 LAB — POCT PREGNANCY, URINE: Preg Test, Ur: NEGATIVE

## 2017-12-17 MED ORDER — METRONIDAZOLE 500 MG PO TABS: 500.0000 mg | ORAL_TABLET | Freq: Two times a day (BID) | ORAL | 0 refills | Status: DC

## 2017-12-17 MED ORDER — FLUCONAZOLE 150 MG PO TABS: 150.0000 mg | ORAL_TABLET | Freq: Every day | ORAL | 0 refills | Status: DC

## 2017-12-17 MED ORDER — OXYCODONE-ACETAMINOPHEN 5-325 MG PO TABS
1.0000 | ORAL_TABLET | Freq: Once | ORAL | Status: AC
  Administered 2017-12-17: 1 via ORAL
  Filled 2017-12-17: qty 1

## 2017-12-17 NOTE — Discharge Instructions (Signed)
Bacterial Vaginosis Bacterial vaginosis is an infection of the vagina. It happens when too many germs (bacteria) grow in the vagina. This infection puts you at risk for infections from sex (STIs). Treating this infection can lower your risk for some STIs. You should also treat this if you are pregnant. It can cause your baby to be born early. Follow these instructions at home: Medicines  Take over-the-counter and prescription medicines only as told by your doctor.  Take or use your antibiotic medicine as told by your doctor. Do not stop taking or using it even if you start to feel better. General instructions  If you your sexual partner is a woman, tell her that you have this infection. She needs to get treatment if she has symptoms. If you have a female partner, he does not need to be treated.  During treatment: ? Avoid sex. ? Do not douche. ? Avoid alcohol as told. ? Avoid breastfeeding as told.  Drink enough fluid to keep your pee (urine) clear or pale yellow.  Keep your vagina and butt (rectum) clean. ? Wash the area with warm water every day. ? Wipe from front to back after you use the toilet.  Keep all follow-up visits as told by your doctor. This is important. Preventing this condition  Do not douche.  Use only warm water to wash around your vagina.  Use protection when you have sex. This includes: ? Latex condoms. ? Dental dams.  Limit how many people you have sex with. It is best to only have sex with the same person (be monogamous).  Get tested for STIs. Have your partner get tested.  Wear underwear that is cotton or lined with cotton.  Avoid tight pants and pantyhose. This is most important in summer.  Do not use any products that have nicotine or tobacco in them. These include cigarettes and e-cigarettes. If you need help quitting, ask your doctor.  Do not use illegal drugs.  Limit how much alcohol you drink. Contact a doctor if:  Your symptoms do not get  better, even after you are treated.  You have more discharge or pain when you pee (urinate).  You have a fever.  You have pain in your belly (abdomen).  You have pain with sex.  Your bleed from your vagina between periods. Summary  This infection happens when too many germs (bacteria) grow in the vagina.  Treating this condition can lower your risk for some infections from sex (STIs).  You should also treat this if you are pregnant. It can cause early (premature) birth.  Do not stop taking or using your antibiotic medicine even if you start to feel better. This information is not intended to replace advice given to you by your health care provider. Make sure you discuss any questions you have with your health care provider. Document Released: 08/31/2008 Document Revised: 08/07/2016 Document Reviewed: 08/07/2016 Elsevier Interactive Patient Education  2017 [place] A disposable sitz bath is a plastic basin that fits over the toilet. A bag is hung above the toilet, and the bag is connected to a tube that opens into the basin. The bag is filled with warm water that flows into the basin through the tube. A sitz bath can be used to help relieve symptoms, clean, and promote healing in the genital and anal areas, as well as in the lower abdomen and buttocks. What are the risks? Sitz baths are generally very safe. It is possible for the  skin between the genitals and the anus (perineum) to become infected, but this is rare. You can avoid this by cleaning your sitz bath supplies thoroughly. How to use a disposable sitz bath 1. Close the clamp on the tube. Make sure the clamp is closed tightly to prevent leakage. 2. Fill the sitz bath basin and the plastic bag with warm water. The water should be warm enough to be comfortable, but not hot. 3. Raise the toilet seat and place the filled basin on the toilet. Make sure the overflow opening is facing toward the back of the  toilet. ? If you prefer, you may place the empty basin on the toilet first, and then use the plastic bag to fill the basin with warm water. 4. Hang the filled plastic bag overhead on a hook or towel rack close to the toilet. The bag should be higher than the toilet so that the water will flow down through the tube. 5. Attach the tube to the opening on the basin. Make sure that the tube is attached to the basin tightly to prevent leakage. 6. Sit on the basin and release the clamp. This will allow warm water to flow into the basin and flush the area around your genitals and anus. 7. Remain sitting on the basin for about 15-20 minutes, or as long as told by your health care provider. 8. Stand up and gently pat your skin dry. If directed, apply clean bandages (dressings) to the affected area as told by your health care provider. 9. Carefully remove the basin from the toilet seat and tip the basin into the toilet to empty any remaining water. Empty any remaining water from the plastic bag into the toilet. Then, flush the toilet. 10. Wash the basin with warm water and soap. Let the basin air dry in the sink. You should also let the plastic bag and the tubing air dry. 11. Store the basin, tubing, and plastic bag in a clean, dry area. 12. Wash your hands with soap and water. If soap and water are not available, use hand sanitizer. Contact a health care provider if:  You have symptoms that get worse instead of better.  You develop new skin irritation, redness, or swelling around your genitals or anus. This information is not intended to replace advice given to you by your health care provider. Make sure you discuss any questions you have with your health care provider. Document Released: 05/23/2012 Document Revised: 04/29/2016 Document Reviewed: 10/12/2015 Elsevier Interactive Patient Education  2018 [place] of a Perineal Tear A perineal tear is a cut (laceration) in the tissue between the  opening of the vagina and the anus (perineum). Some women naturally develop a perineal tear during a vaginal birth. This can happen as the baby emerges from the birth canal and the perineum is stretched. Perineal tears are graded based on how deep and long the laceration is. The grading for perineal tears is as follows:  First degree. This involves a shallow tear at the edge of the vaginal opening that extends slightly into the perineal skin.  Second degree. This involves tearing described in a first degree perineal tear and also a deeper tear of the vaginal opening and perineal tissues. It may also include tearing of a muscle just under the perineal skin.  Third degree. This involves tearing described in a first and second degree perineal tear, with the tear extending into the muscle of the anus (anal sphincter).  Fourth degree. This involves  all levels of tear described for first, second, and third degree perineal tear, with the tear extending into the rectum.  First degree perineal tears may or may not be stitched closed, depending on their location and appearance. Second, third, and fourth degree perineal tears are stitched closed immediately after the babys birth. What are the risks? Depending on the type of perineal tear you have, you may be at risk for the following:  Bleeding.  Developing a collection of blood in the perineal tear area (hematoma).  Pain. This may include pain with urination or bowel movements.  Infection at the site of the tear.  Fever.  Trouble controlling your bowels (fecal incontinence).  Painful sexual intercourse.  How to care for a perineal tear  The first day, put ice on the area of the tear. ? Put ice in a plastic bag. ? Place a towel between your skin and the bag. ? Leave the ice on for 20 minutes, 2-3 times a day.  Bathe using a warm sitz bath as directed by your health care provider. This can speed up healing. Sitz baths can be performed in your  bathtub or using a sitz bath kit that fits over your toilet. ? Place 3-4 in. (7.6-10 cm) of warm water in your bathtub or fill the sitz bath over-the-toilet container with warm water. Make sure the water is not too hot by placing a drop on your wrist. ? Sit in the warm water for 20-30 minutes. ? After bathing, pat your perineum dry with a clean towel. Do not scrub the perineum as this could cause pain, irritation, or open any stitches you may have. ? Keep the over-the-toilet sitz bath container clean by rinsing it thoroughly after each use. Ask for help in keeping the bathtub clean with diluted bleach and water (2 Tbsp [30 mL] of bleach to  gal [1.9 L] of water). ? Repeat the sitz bath as often as you would like to relieve perineal pain, itching, or discomfort.  Apply a numbing spray to the perineal tear site as directed by your health care provider. This may help with discomfort.  Wash your hands before and after applying medicine to the area.  Put about 3 witch hazel-containing hemorrhoid treatment pads on top of your sanitary pad. The witch hazel in the hemorrhoid pads helps with discomfort and swelling.  Get a squeeze bottle to squeeze warm water on your perineum when urinating, spraying the area from front to back. Pat the area to dry it.  Sitting on an inflatable ring or pillow may provide comfort.  Take medicines only as directed by your health care provider.  Do not have sexual intercourse or use tampons until your health care provider says it is okay. Typically, you must wait at least 6 weeks.  Keep all postpartum appointments as directed by your health care provider. Contact a health care provider if:  Your pain is not relieved with medicines.  You have painful urination.  You have a fever. Get help right away if:  You have redness, swelling, or increasing pain in the area of the tear.  You have pus coming from the area of the tear.  You notice a bad smell coming from  the area of the tear.  Your tear opens.  You notice swelling in the area of the tear that is larger than when you left the hospital.  You cannot urinate. This information is not intended to replace advice given to you by your health care  provider. Make sure you discuss any questions you have with your health care provider. Document Released: 04/08/2014 Document Revised: 05/05/2016 Document Reviewed: 08/28/2013 Elsevier Interactive Patient Education  2017 [employer].

## 2017-12-17 NOTE — MAU Provider Note (Signed)
History   31 WF in with pain at base of vagina and vaginal discharge. States sex 2 days ago.   CSN: [id number]  Arrival date & time 12/17/17  1158   None     Chief Complaint  Patient presents with  . Back Pain  . Vaginal Discharge    HPI  Past Medical History:  Diagnosis Date  . ADD (attention deficit disorder)   . Anemia 2011   2o to GASTRIC BYPASS  . Anginal pain ([place])   . Anxiety   . Blood transfusion without reported diagnosis   . Chronic daily headache   . Depression   . Depression   . Dysmenorrhea 09/18/2013  . Dysrhythmia   . Elevated liver enzymes JUL 2011ALK PHOS 111-127 AST  143-267 ALT  213-321T BILI 0.6  ALB  3.7-4.06 Jun 2011 ALK PHOS 118 AST 24 ALT 42 T BILI 0.4 ALB 3.9  . Encounter for drug rehabilitation    at behavioral health for opioid addiction about 4 years ago  . Family history of adverse reaction to anesthesia    'dad had to be kept on pump for breathing for morphine'  . Fatty liver   . Fibroid 01/18/2017  . Fibromyalgia   . Gastric bypass status for obesity   . Gastritis JULY 2011  . Heart murmur   . 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 History of Holter monitoring   . Hx of opioid abuse    for about 4 years, about 4 years ago  . Interstitial cystitis   . Iron deficiency anemia 07/23/2010  . Irritable bowel syndrome 2012 DIARRHEA   JUN 2012 TTG IgA 14.9  . IUD FEB 2010  . Lupus   . Menorrhagia 07/18/2013  . Migraines   . Obesity (BMI 30-39.9) 2011 228 LBS BMI 36.8  . Ovarian cyst   . Patient desires pregnancy 09/18/2013  . Polyneuropathy   . PONV (postoperative nausea and vomiting) seizure post-operatively  . Potassium (K) deficiency   . Pregnant 12/25/2013  . Psychiatric pseudoseizure   . RLQ abdominal pain 07/18/2013  . Sciatica of left side 11/14/2014  . 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 Stress 09/18/2013    Past Surgical History:  Procedure Laterality Date  . BIOPSY  04/10/2015   Procedure:  BIOPSY;  Surgeon: Daneil Dolin, MD;  Location: [facility];  Service: Endoscopy;;  . cath self every nite     for sodium bicarb injection (discontinued 2013)  . CHOLECYSTECTOMY  2005   biliary dyskinesia  . COLONOSCOPY  JUN 2012 ABD PN/DIARRHEA WITH PROPOFOL   NL COLON  . DILATION AND CURETTAGE OF UTERUS    . DILITATION & CURRETTAGE/HYSTROSCOPY WITH NOVASURE ABLATION N/A 03/24/2017   Procedure: DILATATION & CURETTAGE/HYSTEROSCOPY WITH NOVASURE ENDOMETRIAL ABLATION;  Surgeon: Jonnie Kind, MD;  Location: [facility];  Service: Gynecology;  Laterality: N/A;  . ESOPHAGEAL DILATION N/A 04/10/2015   Procedure: ESOPHAGEAL DILATION WITH 54FR MALONEY DILATOR;  Surgeon: Daneil Dolin, MD;  Location: [facility];  Service: Endoscopy;  Laterality: N/A;  . ESOPHAGOGASTRODUODENOSCOPY    . ESOPHAGOGASTRODUODENOSCOPY (EGD) WITH PROPOFOL N/A 04/10/2015   Procedure: ESOPHAGOGASTRODUODENOSCOPY (EGD) WITH PROPOFOL;  Surgeon: Daneil Dolin, MD;  Location: [facility];  Service: Endoscopy;  Laterality: N/A;  . ESOPHAGOGASTRODUODENOSCOPY (EGD) WITH PROPOFOL N/A 12/06/2016   Procedure: ESOPHAGOGASTRODUODENOSCOPY (EGD) WITH PROPOFOL;  Surgeon: Danie Binder, MD;  Location: [facility];  Service: Endoscopy;  Laterality: N/A;  . Danella Penton  2007   in [place]-POUCH 5 CM  . GASTRIC BYPASS  06/2006  . HYSTEROSCOPY W/D&C N/A 09/12/2014   Procedure: DILATATION AND CURETTAGE /HYSTEROSCOPY;  Surgeon: Jonnie Kind, MD;  Location: [facility];  Service: Gynecology;  Laterality: N/A;  . LAPAROSCOPIC TUBAL LIGATION Bilateral 03/24/2017   Procedure: LAPAROSCOPIC TUBAL LIGATION ([place] Rings);  Surgeon: Jonnie Kind, MD;  Location: [facility];  Service: Gynecology;  Laterality: Bilateral;  . REPAIR VAGINAL CUFF N/A 07/30/2014   Procedure: REPAIR VAGINAL CUFF;  Surgeon: Mora Bellman, MD;  Location: [facility];  Service: Gynecology;  Laterality: N/A;  . SAVORY DILATION  06/20/2012   Dr. Barnie Alderman gastritis/Ulcer in the mid jejunum. Empiric dilation.   .  SURAL NERVE BX Left 02/25/2016   Procedure: LEFT SURAL NERVE BIOPSY;  Surgeon: Jovita Gamma, MD;  Location: [facility];  Service: Neurosurgery;  Laterality: Left;  Left sural nerve biopsy  . TONSILLECTOMY    . TONSILLECTOMY AND ADENOIDECTOMY    . UPPER GASTROINTESTINAL ENDOSCOPY  JULY 2011 NAUSEA-D125,V6, PH 25   Bx; GASTRITIS, POUCH-5 CM LONG  . WISDOM TOOTH EXTRACTION      Family History  Problem Relation Age of Onset  . Hemochromatosis Maternal Grandmother   . Migraines Maternal Grandmother   . Cancer Maternal Grandmother   . Breast cancer Maternal Grandmother   . Hypertension Father   . Diabetes Father   . Coronary artery disease Father   . Migraines Paternal Grandmother   . Breast cancer Paternal Grandmother   . Cancer Mother        breast  . Hemochromatosis Mother   . Breast cancer Mother   . Depression Mother   . Anxiety disorder Mother   . Coronary artery disease Paternal Grandfather   . Anxiety disorder Brother   . Bipolar disorder Brother   . Healthy Daughter   . Healthy Son     Social History   Tobacco Use  . Smoking status: Current Every Day Smoker    Packs/day: 0.25    Years: 6.00    Pack years: 1.50    Types: E-cigarettes  . Smokeless tobacco: Never Used  Substance Use Topics  . Alcohol use: Yes    Alcohol/week: 0.0 oz    Comment: occasionally  . Drug use: Yes    Types: Marijuana    Comment: Previously opioid addiction went through rehab    OB History    Gravida Para Term Preterm AB Living   _0 SAB TAB Ectopic Multiple Live Births   1       2      Review of Systems  Constitutional: Negative.   HENT: Negative.   Eyes: Negative.   Respiratory: Negative.   Cardiovascular: Negative.   Gastrointestinal: Negative.   Endocrine: Negative.   Genitourinary: Positive for vaginal discharge and vaginal pain.  Musculoskeletal: Negative.   Skin: Negative.   Allergic/Immunologic: Negative.   Neurological: Negative.   Hematological:  Negative.   Psychiatric/Behavioral: Negative.     Allergies  Gabapentin; Metoclopramide hcl; Tramadol; Nucynta [tapentadol]; Propofol; Trazodone and nefazodone; Zofran [ondansetron]; Butrans [buprenorphine]; Toradol [ketorolac tromethamine]; Latex; Lyrica [pregabalin]; and Tape  Home Medications    BP 135/86 (BP Location: Right Arm)   Pulse 65   Temp 98.4 F (36.9 C)   Resp 16   Ht _1  (1.676 m)   Wt 202 lb (91.6 kg)   BMI 32.60 kg/m   Physical Exam  Constitutional: She is oriented to  person, place, and time. She appears well-developed and well-nourished.  HENT:  Head: Normocephalic.  Eyes: Pupils are equal, round, and reactive to light.  Neck: Normal range of motion.  Cardiovascular: Normal rate, regular rhythm, normal heart sounds and intact distal pulses.  Pulmonary/Chest: Effort normal and breath sounds normal.  Abdominal: Soft. Bowel sounds are normal.  Genitourinary:  Genitourinary Comments:  .5cm fissure noted at bast of vagina at introitus  Musculoskeletal: Normal range of motion.  Neurological: She is alert and oriented to person, place, and time. She has normal reflexes.  Skin: Skin is warm and dry.  Psychiatric: She has a normal mood and affect. Her behavior is normal. Judgment and thought content normal.    MAU Course  Procedures (including critical care time)  Labs Reviewed  WET PREP, GENITAL  URINALYSIS, ROUTINE W REFLEX MICROSCOPIC  GC/CHLAMYDIA PROBE AMP ([place]) NOT AT [facility]   No results found.   1. Perineal fissure in female   2. Vaginal discharge       MDM  VSS, wet prep . Cultures obtained. Upon inspection .5cm fissure noted at bast of vagina at introitus. Pt instructed on sitz bath and OTC triple antibiotic ointment. To follow up with Dr. Glo Herring next week for eval.

## 2017-12-17 NOTE — MAU Note (Signed)
Patient has vaginal discharge x 1 week white with an odor, golf ball hematoma in vaginal area x 2 days. Lower back pain and abdominal pain, had ablation and tubal ligation in April has had problems since.

## 2017-12-18 ENCOUNTER — Emergency Department (HOSPITAL_COMMUNITY)
Admission: EM | Admit: 2017-12-18 | Discharge: 2017-12-18 | Disposition: A | Discharge status: Home / Self Care | Payer: Medicaid Other | Attending: Emergency Medicine | Admitting: Emergency Medicine

## 2017-12-18 ENCOUNTER — Other Ambulatory Visit: Payer: Self-pay

## 2017-12-18 ENCOUNTER — Emergency Department (HOSPITAL_COMMUNITY): Payer: Medicaid Other

## 2017-12-18 ENCOUNTER — Encounter (HOSPITAL_COMMUNITY): Payer: Self-pay | Admitting: *Deleted

## 2017-12-18 DIAGNOSIS — R102 Pelvic and perineal pain unspecified side: Secondary | ICD-10-CM

## 2017-12-18 LAB — URINALYSIS, ROUTINE W REFLEX MICROSCOPIC
BILIRUBIN URINE: NEGATIVE
Glucose, UA: NEGATIVE mg/dL
HGB URINE DIPSTICK: NEGATIVE
Ketones, ur: NEGATIVE mg/dL
Leukocytes, UA: NEGATIVE
NITRITE: NEGATIVE
PROTEIN: NEGATIVE mg/dL
Specific Gravity, Urine: 1.017 (ref 1.005–1.030)
pH: 5 (ref 5.0–8.0)

## 2017-12-18 MED ORDER — KETOROLAC TROMETHAMINE 60 MG/2ML IM SOLN
60.0000 mg | Freq: Once | INTRAMUSCULAR | Status: AC
Start: 2017-12-18 — End: 2017-12-18
  Administered 2017-12-18: 60 mg via INTRAMUSCULAR
  Filled 2017-12-18: qty 2

## 2017-12-18 NOTE — ED Provider Notes (Signed)
[facility] EMERGENCY DEPARTMENT Provider Note   CSN: [id number] Arrival date & time: 12/17/17  2349  Time seen 01:20 AM   History   Chief Complaint Chief Complaint  Patient presents with  . Vaginal Pain    HPI Penny [name] is a 32 y.o. female.  HPI the patient states she has had vaginal discharge for the past week.  She states she went to [facility] today around noon and was examined her nose with bacterial vaginosis and a fissure near her vaginal opening.  She states it hurts every time she pees.  She states that started today.  I reminded her that the fissure near her vagina would make it hurt when she urinates.  She denies frequency, she states she started having nausea tonight without vomiting or fever.  She is very persistent about asking for pain medicine and states that [facility] and it did not even touch her pain.  PCP Kathyrn Drown, MD   Past Medical History:  Diagnosis Date  . ADD (attention deficit disorder)   . Anemia 2011   2o to GASTRIC BYPASS  . Anginal pain ([place])   . Anxiety   . Blood transfusion without reported diagnosis   . Chronic daily headache   . Depression   . Depression   . Dysmenorrhea 09/18/2013  . Dysrhythmia   . Elevated liver enzymes JUL 2011ALK PHOS 111-127 AST  143-267 ALT  213-321T BILI 0.6  ALB  3.7-4.06 Jun 2011 ALK PHOS 118 AST 24 ALT 42 T BILI 0.4 ALB 3.9  . Encounter for drug rehabilitation    at behavioral health for opioid addiction about 4 years ago  . Family history of adverse reaction to anesthesia    'dad had to be kept on pump for breathing for morphine'  . Fatty liver   . Fibroid 01/18/2017  . Fibromyalgia   . Gastric bypass status for obesity   . Gastritis JULY 2011  . Heart murmur   . 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 History of Holter monitoring   . Hx of opioid abuse    for about 4 years, about 4 years ago  . Interstitial cystitis   . Iron deficiency  anemia 07/23/2010  . Irritable bowel syndrome 2012 DIARRHEA   JUN 2012 TTG IgA 14.9  . IUD FEB 2010  . Lupus   . Menorrhagia 07/18/2013  . Migraines   . Obesity (BMI 30-39.9) 2011 228 LBS BMI 36.8  . Ovarian cyst   . Patient desires pregnancy 09/18/2013  . Polyneuropathy   . PONV (postoperative nausea and vomiting) seizure post-operatively  . Potassium (K) deficiency   . Pregnant 12/25/2013  . Psychiatric pseudoseizure   . RLQ abdominal pain 07/18/2013  . Sciatica of left side 11/14/2014  . 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 Stress 09/18/2013    Patient Active Problem List   Diagnosis Date Noted  . Abnormal urine odor 03/30/2017  . Urinary tract infection with hematuria 03/30/2017  . Screening for genitourinary condition 03/30/2017  . Pain 03/30/2017  . Burning with urination 03/30/2017  . Family history of breast cancer in mother,uncertain BR CA status 02/28/2017  . Fibroid 01/18/2017  . Chronic pain syndrome 12/05/2016  . Folate deficiency 12/04/2016  . Nausea vomiting and diarrhea 12/04/2016  . Severe anemia 12/04/2016  . Symptomatic anemia 12/03/2016  . Complaints of leg weakness   . Paresthesia   . Left-sided weakness   .  Uncontrolled pain 01/24/2016  . Malnutrition of moderate degree 01/24/2016  . Neuropathy 01/23/2016  . Inability to walk 01/23/2016  . Paresthesias   . Bilateral leg numbness 11/26/2015  . 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 Post partum depression 05/09/2015  . Anastomotic ulcer   . Dysphagia   . Hematemesis 04/07/2015  . Intractable vomiting with nausea 04/07/2015  . Elevated liver enzymes   . Epigastric pain   . Pseudoseizure ([place]) 02/22/2015  . Seizure-like activity ([place]) 02/22/2015  . Fibromyalgia 02/18/2015  . Vulvar fissure 02/18/2015  . Clinical depression 02/01/2015  . Current smoker 01/29/2015  . Current tobacco use 01/29/2015  . Hereditary and idiopathic  peripheral neuropathy 01/15/2015  . Leg weakness, bilateral 01/02/2015  . Gait disorder 01/02/2015  . Other symptoms and signs involving the musculoskeletal system 01/02/2015  . Abnormal gait 01/02/2015  . Cervicalgia 12/18/2014  . Sciatica of left side 11/14/2014  . Neuralgia neuritis, sciatic nerve 11/14/2014  . 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 Encounter for sterilization 09/09/2014  . Abdominal pain, acute, right lower quadrant 08/22/2014  . Headache, migraine 08/19/2014  . Seizure ([place]) 08/18/2014  . Encephalopathy 08/13/2014  . Headache 08/13/2014  . Altered mental status 08/13/2014  . Right leg numbness 07/31/2014  . Disturbance of skin sensation 07/31/2014  . Hypertension in pregnancy, transient 07/08/2014  . Previous gastric bypass affecting pregnancy, antepartum 05/22/2014  . 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 ASD (atrial septal defect), ostium secundum 05/17/2014  . Depression complicating pregnancy in second trimester, antepartum 05/09/2014  . Antepartum mental disorder in pregnancy 05/09/2014  . Rapid palpitations 02/18/2014  . H/O maternal third degree perineal laceration, currently pregnant 02/18/2014  . High-risk pregnancy 02/18/2014  . Supervision of pregnancy with other poor reproductive or obstetric history, unspecified trimester 02/18/2014  . Restless legs 01/16/2014  . Restless leg 01/16/2014  . Chronic interstitial cystitis 10/23/2013  . Menorrhagia 07/18/2013  . Excessive and frequent menstruation 07/18/2013  . h/o Opiate addiction 03/11/2012    Class: Acute  . History of migraine headaches 03/10/2012    Class: Acute  . Depression with anxiety 03/10/2012    Class: Chronic  . 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 Panic disorder without agoraphobia 03/10/2012  . H/O disease 03/10/2012  .  Dysthymia 03/10/2012  . Pelvic congestion syndrome 10/13/2011  . Coitalgia 10/13/2011  . Chronic migraine without aura 10/13/2011  . Unspecified dyspareunia 10/13/2011  . IBS (irritable bowel syndrome) 08/25/2011  . Diarrhea 05/27/2011  . OBESITY, UNSPECIFIED 09/17/2010  . Transaminitis 09/17/2010  . Anemia, iron deficiency 07/23/2010    Past Surgical History:  Procedure Laterality Date  . BIOPSY  04/10/2015   Procedure: BIOPSY;  Surgeon: Daneil Dolin, MD;  Location: [facility];  Service: Endoscopy;;  . cath self every nite     for sodium bicarb injection (discontinued 2013)  . CHOLECYSTECTOMY  2005   biliary dyskinesia  . COLONOSCOPY  JUN 2012 ABD PN/DIARRHEA WITH PROPOFOL   NL COLON  . DILATION AND CURETTAGE OF UTERUS    . DILITATION & CURRETTAGE/HYSTROSCOPY WITH NOVASURE ABLATION N/A 03/24/2017   Procedure: DILATATION & CURETTAGE/HYSTEROSCOPY WITH NOVASURE ENDOMETRIAL ABLATION;  Surgeon: Jonnie Kind, MD;  Location: [facility];  Service: Gynecology;  Laterality: N/A;  . ESOPHAGEAL DILATION N/A 04/10/2015   Procedure: ESOPHAGEAL DILATION WITH  54FR MALONEY DILATOR;  Surgeon: Daneil Dolin, MD;  Location: [facility];  Service: Endoscopy;  Laterality: N/A;  . ESOPHAGOGASTRODUODENOSCOPY    . ESOPHAGOGASTRODUODENOSCOPY (EGD) WITH PROPOFOL N/A 04/10/2015   Procedure: ESOPHAGOGASTRODUODENOSCOPY (EGD) WITH PROPOFOL;  Surgeon: Daneil Dolin, MD;  Location: [facility];  Service: Endoscopy;  Laterality: N/A;  . ESOPHAGOGASTRODUODENOSCOPY (EGD) WITH PROPOFOL N/A 12/06/2016   Procedure: ESOPHAGOGASTRODUODENOSCOPY (EGD) WITH PROPOFOL;  Surgeon: Danie Binder, MD;  Location: [facility];  Service: Endoscopy;  Laterality: N/A;  . GAB  2007   in [place]-POUCH 5 CM  . GASTRIC BYPASS  06/2006  . HYSTEROSCOPY W/D&C N/A 09/12/2014   Procedure: DILATATION AND CURETTAGE /HYSTEROSCOPY;  Surgeon: Jonnie Kind, MD;  Location: [facility];  Service: Gynecology;  Laterality: N/A;  . LAPAROSCOPIC TUBAL LIGATION Bilateral  03/24/2017   Procedure: LAPAROSCOPIC TUBAL LIGATION ([place] Rings);  Surgeon: Jonnie Kind, MD;  Location: [facility];  Service: Gynecology;  Laterality: Bilateral;  . REPAIR VAGINAL CUFF N/A 07/30/2014   Procedure: REPAIR VAGINAL CUFF;  Surgeon: Mora Bellman, MD;  Location: [facility];  Service: Gynecology;  Laterality: N/A;  . SAVORY DILATION  06/20/2012   Dr. Barnie Alderman gastritis/Ulcer in the mid jejunum. Empiric dilation.   . SURAL NERVE BX Left 02/25/2016   Procedure: LEFT SURAL NERVE BIOPSY;  Surgeon: Jovita Gamma, MD;  Location: [place] [facility] ORS;  Service: Neurosurgery;  Laterality: Left;  Left sural nerve biopsy  . TONSILLECTOMY    . TONSILLECTOMY AND ADENOIDECTOMY    . UPPER GASTROINTESTINAL ENDOSCOPY  JULY 2011 NAUSEA-D125,V6, PH 25   Bx; GASTRITIS, POUCH-5 CM LONG  . WISDOM TOOTH EXTRACTION      OB History    Gravida Para Term Preterm AB Living   '3 2 1 1 1 2   '$ SAB TAB Ectopic Multiple Live Births   1       2       Home Medications    Patient states she is taking no medication   Prior to Admission medications   Medication Sig Start Date End Date Taking? Authorizing Provider  amphetamine-dextroamphetamine (ADDERALL XR) 30 MG 24 hr capsule Take 1 capsule (30 mg total) by mouth every morning. 11/07/17   Cloria Spring, MD  carbamazepine (TEGRETOL) 200 MG tablet Take two tablets at bedtime 11/07/17   Cloria Spring, MD  clonazePAM (KLONOPIN) 0.5 MG tablet Take 1 tablet (0.5 mg total) by mouth 3 (three) times daily as needed for anxiety. 11/07/17 11/07/18  Cloria Spring, MD  DULoxetine (CYMBALTA) 60 MG capsule Take 2 capsules (120 mg total) by mouth at bedtime. 11/07/17   Cloria Spring, MD  fluconazole (DIFLUCAN) 150 MG tablet TAKE 1 TABLET BY MOUTH ONCE. 11/21/17   Kathyrn Drown, MD  fluconazole (DIFLUCAN) 150 MG tablet Take 1 tablet (150 mg total) by mouth daily. 12/17/17   Jorje Guild, NP  HYDROcodone Bitartrate ER [facility] ER) 20 MG T24A Take 40 mg by mouth at bedtime.      [provider]  metroNIDAZOLE (FLAGYL) 500 MG tablet Take 1 tablet (500 mg total) by mouth 2 (two) times daily. 12/17/17   Keitha Butte, CNM  oxyCODONE (ROXICODONE) 15 MG immediate release tablet Take 15 mg by mouth every 4 (four) hours as needed for pain.    [provider]  pantoprazole (PROTONIX) 40 MG tablet Take 1 tablet (40 mg total) by mouth daily. 11/10/17   Setzer, Rona Ravens, NP  Pediatric Multivitamins-Iron (FLINTSTONES PLUS IRON PO)  Take 1 tablet by mouth 2 (two) times daily.     [provider]  phenazopyridine (PYRIDIUM) 100 MG tablet TAKE (1) TABLET BY MOUTH (3) TIMES A DAY AS NEEDED FOR PAIN. 04/14/17   Kathyrn Drown, MD  PROAIR HFA 108 (90 Base) MCG/ACT inhaler INHALE 2 PUFFS INTO THE LUNGS EVERY 6 HOURS AS NEEDED. 06/14/17   Kathyrn Drown, MD  promethazine (PHENERGAN) 25 MG tablet Take 1 tablet (25 mg total) by mouth every 6 (six) hours as needed. 06/14/17   Theodis Blaze, MD  topiramate (TOPAMAX) 50 MG tablet TAKE 1 TABLET BY MOUTH EACH MORNING AND 2 TABLETS AT BEDTIME. 11/07/17   Cloria Spring, MD    Family History Family History  Problem Relation Age of Onset  . Hemochromatosis Maternal Grandmother   . Migraines Maternal Grandmother   . Cancer Maternal Grandmother   . Breast cancer Maternal Grandmother   . Hypertension Father   . Diabetes Father   . Coronary artery disease Father   . Migraines Paternal Grandmother   . Breast cancer Paternal Grandmother   . Cancer Mother        breast  . Hemochromatosis Mother   . Breast cancer Mother   . Depression Mother   . Anxiety disorder Mother   . Coronary artery disease Paternal Grandfather   . Anxiety disorder Brother   . Bipolar disorder Brother   . Healthy Daughter   . Healthy Son     Social History Social History   Tobacco Use  . Smoking status: Current Every Day Smoker    Packs/day: 0.25    Years: 6.00    Pack years: 1.50    Types: E-cigarettes  . Smokeless tobacco: Never Used   Substance Use Topics  . Alcohol use: Yes    Alcohol/week: 0.0 oz    Comment: occasionally  . Drug use: Yes    Types: Marijuana    Comment: Previously opioid addiction went through rehab  on disability for fibromyalgia and pressure palsy (polyneuropathy) Quit smoking 1 month ago, no longer using vapes, was smoking 1 ppd   Allergies   Gabapentin; Metoclopramide hcl; Tramadol; Nucynta [tapentadol]; Propofol; Trazodone and nefazodone; Zofran [ondansetron]; Butrans [buprenorphine]; Toradol [ketorolac tromethamine]; Latex; Lyrica [pregabalin]; and Tape   Review of Systems Review of Systems  All other systems reviewed and are negative.    Physical Exam Updated Vital Signs BP (!) 138/92 (BP Location: Right Arm)   Pulse 83   Temp 98.4 F (36.9 C) (Oral)   Resp 18   Ht '5\' 6"'$  (1.676 m)   Wt 91.6 kg (202 lb)   SpO2 100%   BMI 32.60 kg/m   Vital signs normal    Physical Exam  Constitutional: She is oriented to person, place, and time. She appears well-developed and well-nourished.  Non-toxic appearance. She does not appear ill. No distress.  HENT:  Head: Normocephalic and atraumatic.  Right Ear: External ear normal.  Left Ear: External ear normal.  Nose: Nose normal. No mucosal edema or rhinorrhea.  Mouth/Throat: Oropharynx is clear and moist and mucous membranes are normal. No dental abscesses or uvula swelling.  Eyes: Conjunctivae and EOM are normal. Pupils are equal, round, and reactive to light.  Neck: Normal range of motion and full passive range of motion without pain. Neck supple.  Cardiovascular: Normal rate, regular rhythm and normal heart sounds. Exam reveals no gallop and no friction rub.  No murmur heard. Pulmonary/Chest: Effort normal and breath sounds normal. No  respiratory distress. She has no wheezes. She has no rhonchi. She has no rales. She exhibits no tenderness and no crepitus.  Abdominal: Soft. Normal appearance and bowel sounds are normal. She exhibits no  distension. There is tenderness in the suprapubic area. There is no rebound and no guarding.    Bilateral flank pain left worse than the right, patient is holding her left lower quadrant.  Musculoskeletal: Normal range of motion. She exhibits no edema or tenderness.  Moves all extremities well.   Neurological: She is alert and oriented to person, place, and time. She has normal strength. No cranial nerve deficit.  Skin: Skin is warm, dry and intact. No rash noted. No erythema. No pallor.  Psychiatric: She has a normal mood and affect. Her speech is normal and behavior is normal. Her mood appears not anxious.  Nursing note and vitals reviewed.    ED Treatments / Results  Labs  Results for orders placed or performed during the hospital encounter of 12/18/17  Urinalysis, Routine w reflex microscopic  Result Value Ref Range   Color, Urine YELLOW YELLOW   APPearance HAZY (A) CLEAR   Specific Gravity, Urine 1.017 1.005 - 1.030   pH 5.0 5.0 - 8.0   Glucose, UA NEGATIVE NEGATIVE mg/dL   Hgb urine dipstick NEGATIVE NEGATIVE   Bilirubin Urine NEGATIVE NEGATIVE   Ketones, ur NEGATIVE NEGATIVE mg/dL   Protein, ur NEGATIVE NEGATIVE mg/dL   Nitrite NEGATIVE NEGATIVE   Leukocytes, UA NEGATIVE NEGATIVE   Laboratory interpretation all normal      Results for orders placed or performed during the hospital encounter of 12/17/17  Wet prep, genital  Result Value Ref Range   Yeast Wet Prep HPF POC NONE SEEN NONE SEEN   Trich, Wet Prep NONE SEEN NONE SEEN   Clue Cells Wet Prep HPF POC PRESENT (A) NONE SEEN   WBC, Wet Prep HPF POC FEW (A) NONE SEEN   Sperm NONE SEEN   Urinalysis, Routine w reflex microscopic  Result Value Ref Range   Color, Urine YELLOW YELLOW   APPearance CLEAR CLEAR   Specific Gravity, Urine 1.011 1.005 - 1.030   pH 6.0 5.0 - 8.0   Glucose, UA NEGATIVE NEGATIVE mg/dL   Hgb urine dipstick NEGATIVE NEGATIVE   Bilirubin Urine NEGATIVE NEGATIVE   Ketones, ur NEGATIVE  NEGATIVE mg/dL   Protein, ur NEGATIVE NEGATIVE mg/dL   Nitrite NEGATIVE NEGATIVE   Leukocytes, UA TRACE (A) NEGATIVE   RBC / HPF 0-5 0 - 5 RBC/hpf   WBC, UA 6-30 0 - 5 WBC/hpf   Bacteria, UA NONE SEEN NONE SEEN   Squamous Epithelial / LPF 0-5 (A) NONE SEEN   Mucus PRESENT   Pregnancy, urine POC  Result Value Ref Range   Preg Test, Ur NEGATIVE NEGATIVE      EKG  EKG Interpretation None       Radiology Ct Renal Stone Study  Result Date: 12/18/2017 CLINICAL DATA:  Lower mid back pain radiating to the lower quadrants EXAM: CT ABDOMEN AND PELVIS WITHOUT CONTRAST TECHNIQUE: Multidetector CT imaging of the abdomen and pelvis was performed following the standard protocol without IV contrast. COMPARISON:  CT abdomen pelvis 12/03/2016, 06/15/2016 FINDINGS: Lower chest: Lung bases are clear.  Heart size is normal. Hepatobiliary: No focal liver abnormality is seen. Status post cholecystectomy. No biliary dilatation. Pancreas: Unremarkable. No pancreatic ductal dilatation or surrounding inflammatory changes. Spleen: Normal in size without focal abnormality. Adrenals/Urinary Tract: Adrenal glands are unremarkable. Kidneys are normal, without  renal calculi, focal lesion, or hydronephrosis. Bladder is unremarkable. Stomach/Bowel: Status post gastric bypass. Negative for bowel obstruction or wall thickening. Normal appendix Vascular/Lymphatic: Nonaneurysmal aorta. Slightly enlarged right greater than left inguinal lymph nodes measuring up to 12 mm on the right and 1 cm on the left Reproductive: Uterus and bilateral adnexa are unremarkable. Other: Negative for free air or free fluid. Musculoskeletal: Degenerative changes. No acute or suspicious abnormality. IMPRESSION: 1. Negative for nephrolithiasis, hydronephrosis or ureteral stone 2. No CT evidence for acute intra-abdominal or pelvic abnormality. 3. Slightly enlarged bilateral inguinal lymph nodes, nonspecific Electronically Signed   By: Donavan Foil  M.D.   On: 12/18/2017 02:02    Procedures Procedures (including critical care time)  Medications Ordered in ED Medications  ketorolac (TORADOL) injection 60 mg (60 mg Intramuscular Given 12/18/17 0255)     Initial Impression / Assessment and Plan / ED Course  I have reviewed the triage vital signs and the nursing notes.  Pertinent labs & imaging results that were available during my care of the patient were reviewed by me and considered in my medical decision making (see chart for details).     Urine has a sample at the bedside that is green in color.  She denies taking any medications.  I look at her allergy she had a lot of worrisome allergies including allergies to Nucynta and Butrans.  She also lists Toradol as causing anxiety.  When I talked to her about her Toradol she states "I cannot take it because of my seizure medication".  I told her that was tramadol not Toradol.  Later on she told the nurse she did want to take it.  Patient initially agreed to Toradol and then she refused it because she wanted IV not IM.  At this point I do not see any reason to start an IV on this patient.  She also told the nursing staff she is going to refuse the pelvic exam.  At time of discharge I went to the room to talk to the patient about her test results which were normal.  The house supervisor was with me during this interview.  I again asked her if she was taking any medication and she again states no.  When I showed her the [place] information she then states she is taking the clonazepam and the Adderall but she is not taking the pain medication.  She states she does not take it however she goes every month to get it filled.  At this point we had a discussion about her symptoms.  She states it hurts when she pees because "I am ripped down there".  I explained to her that his topical treatment until it heals which should be within the next 48 hours.  I explained to her I understood it  was painful but this does not require strong narcotic medications to treat it.  She then starts talking about interstitial cystitis.  She was advised that she would need to follow-up with her urologist for that treatment.  That is not something we would treat in the ED.  We do not do bladder infusions in the ED for interstitial cystitis.  I offered to give her a prescription for Flagyl for her bacterial vaginosis and she states that was already treated at [facility].  She did agree to get the Toradol IM.  Review of the [place] shows patient gets #90 oxycodone 15 mg tablets monthly, last filled December 17, #30 [place]  ER 30 mg tablets (hydrocodone) monthly last filled December 17 from a doctor in [place], she also gets #90 clonazepam 0.5 mg monthly last filled December 3 and #30 Adderall X are 30 mg tablets last filled December 3 from her psychiatrist.  Final Clinical Impressions(s) / ED Diagnoses   Final diagnoses:  Vaginal pain    ED Discharge Orders    None      Plan discharge  Rolland Porter, MD, Barbette Or, MD 12/18/17 [phone]

## 2017-12-18 NOTE — ED Notes (Signed)
In to give pt pain medication as ordered and to set up for pelvic exam. Pt refused IM shot of Toradol stating she has had a bad experience with it being given IM and that "there was no way that she could tolerate a pelvic exam as bad as she is hurting unless she has something for pain".

## 2017-12-18 NOTE — ED Triage Notes (Addendum)
Pt c/o vaginal pain; pt was seen at [facility] today for same complaint

## 2017-12-18 NOTE — Discharge Instructions (Signed)
You need to do local topical treatment of your vaginal tear such as using vaseline over the area before your urinate, urinating in the tub or shower until it heals which should be within the next couple of days. Follow up with your urologist about your concerns about your interstitial cystitis and your gynecologist about your pelvic pain.

## 2017-12-18 NOTE — ED Notes (Signed)
Pt to CT

## 2017-12-19 ENCOUNTER — Encounter: Payer: Self-pay | Admitting: Obstetrics and Gynecology

## 2017-12-19 ENCOUNTER — Ambulatory Visit: Payer: Medicaid Other | Admitting: Obstetrics and Gynecology

## 2017-12-19 VITALS — BP 118/80 | HR 95 | Wt 204.2 lb

## 2017-12-19 DIAGNOSIS — Z113 Encounter for screening for infections with a predominantly sexual mode of transmission: Secondary | ICD-10-CM

## 2017-12-19 DIAGNOSIS — A6009 Herpesviral infection of other urogenital tract: Secondary | ICD-10-CM | POA: Diagnosis not present

## 2017-12-19 LAB — GC/CHLAMYDIA PROBE AMP (~~LOC~~) NOT AT ARMC
CHLAMYDIA, DNA PROBE: NEGATIVE
NEISSERIA GONORRHEA: NEGATIVE

## 2017-12-19 MED ORDER — VALACYCLOVIR HCL 1 G PO TABS: 1000.0000 mg | ORAL_TABLET | Freq: Every day | ORAL | 2 refills | Status: DC

## 2017-12-19 MED ORDER — CYCLOBENZAPRINE HCL 10 MG PO TABS: 10.0000 mg | ORAL_TABLET | Freq: Three times a day (TID) | ORAL | 1 refills | Status: DC | PRN

## 2017-12-19 MED ORDER — KETOROLAC TROMETHAMINE 10 MG PO TABS: 10.0000 mg | ORAL_TABLET | Freq: Four times a day (QID) | ORAL | 0 refills | Status: DC | PRN

## 2017-12-19 NOTE — Patient Instructions (Signed)
Genital Herpes Genital herpes is a common sexually transmitted infection (STI) that is caused by a virus. The virus spreads from person to person through sexual contact. Infection can cause itching, blisters, and sores around the genitals or rectum. Symptoms may last several days and then go away This is called an outbreak. However, the virus remains in your body, so you may have more outbreaks in the future. The time between outbreaks varies and can be months or years. Genital herpes affects men and women. It is particularly concerning for pregnant women because the virus can be passed to the baby during delivery and can cause serious problems. Genital herpes is also a concern for people who have a weak disease-fighting (immune) system. What are the causes? This condition is caused by the herpes simplex virus (HSV) type 1 or type 2. The virus may spread through:  Sexual contact with an infected person, including vaginal, anal, and oral sex.  Contact with fluid from a herpes sore.  The skin. This means that you can get herpes from an infected partner even if he or she does not have a visible sore or does not know that he or she is infected.  What increases the risk? You are more likely to develop this condition if:  You have sex with many partners.  You do not use latex condoms during sex.  What are the signs or symptoms? Most people do not have symptoms (asymptomatic) or have mild symptoms that may be mistaken for other skin problems. Symptoms may include:  Small red bumps near the genitals, rectum, or mouth. These bumps turn into blisters and then turn into sores.  Flu-like symptoms, including: ? Fever. ? Body aches. ? Swollen lymph nodes. ? Headache.  Painful urination.  Pain and itching in the genital area or rectal area.  Vaginal discharge.  Tingling or shooting pain in the legs and buttocks.  Generally, symptoms are more severe and last longer during the first (primary)  outbreak. Flu-like symptoms are also more common during the primary outbreak. How is this diagnosed? Genital herpes may be diagnosed based on:  A physical exam.  Your medical history.  Blood tests.  A test of a fluid sample (culture) from an open sore.  How is this treated? There is no cure for this condition, but treatment with antiviral medicines that are taken by mouth (orally) can do the following:  Speed up healing and relieve symptoms.  Help to reduce the spread of the virus to sexual partners.  Limit the chance of future outbreaks, or make future outbreaks shorter.  Lessen symptoms of future outbreaks.  Your health care provider may also recommend pain relief medicines, such as aspirin or ibuprofen. Follow these instructions at home: Sexual activity  Do not have sexual contact during active outbreaks.  Practice safe sex. Latex condoms and female condoms may help prevent the spread of the herpes virus. General instructions  Keep the affected areas dry and clean.  Take over-the-counter and prescription medicines only as told by your health care provider.  Avoid rubbing or touching blisters and sores. If you do touch blisters or sores: ? Wash your hands thoroughly with soap and water. ? Do not touch your eyes afterward.  To help relieve pain or itching, you may take the following actions as directed by your health care provider: ? Apply a cold, wet cloth (cold compress) to affected areas 4-6 times a day. ? Apply a substance that protects your skin and reduces bleeding (astringent). ?   Apply a gel that helps relieve pain around sores (lidocaine gel). ? Take a warm, shallow bath that cleans the genital area (sitz bath).  Keep all follow-up visits as told by your health care provider. This is important. How is this prevented?  Use condoms. Although anyone can get genital herpes during sexual contact, even with the use of a condom, a condom can provide some  protection.  Avoid having multiple sexual partners.  Talk with your sexual partner about any symptoms either of you may have. Also, talk with your partner about any history of STIs.  Get tested for STIs before you have sex. Ask your partner to do the same.  Do not have sexual contact if you have symptoms of genital herpes. Contact a health care provider if:  Your symptoms are not improving with medicine.  Your symptoms return.  You have new symptoms.  You have a fever.  You have abdominal pain.  You have redness, swelling, or pain in your eye.  You notice new sores on other parts of your body.  You are a woman and experience bleeding between menstrual periods.  You have had herpes and you become pregnant or plan to become pregnant. Summary  Genital herpes is a common sexually transmitted infection (STI) that is caused by the herpes simplex virus (HSV) type 1 or type 2.  These viruses are most often spread through sexual contact with an infected person.  You are more likely to develop this condition if you have sex with many partners or you have unprotected sex.  Most people do not have symptoms (asymptomatic) or have mild symptoms that may be mistaken for other skin problems. Symptoms occur as outbreaks that may happen months or years apart.  There is no cure for this condition, but treatment with oral antiviral medicines can reduce symptoms, reduce the chance of spreading the virus to a partner, prevent future outbreaks, or shorten future outbreaks. This information is not intended to replace advice given to you by your health care provider. Make sure you discuss any questions you have with your health care provider. Document Released: 11/19/2000 Document Revised: 10/22/2016 Document Reviewed: 10/22/2016 Elsevier Interactive Patient Education  2018 [employer]  

## 2017-12-19 NOTE — Progress Notes (Signed)
Patient ID: Penny [name], female   DOB: Apr 11, 1986, 32 y.o.   MRN: [id number]   [facility] Visit  '@DATE'$ @            Patient name: Penny [name] MRN [id number]  Date of birth: [birth year]  CC & HPI:  Penny [name] is a 32 y.o. female presenting today for fissure near vaginal opening that was first noticed about 4 days ago. Associated symptoms include vaginal discharge and pulsating back pain. She reports noticing it after intercourse. She was seen at [facility] on 12/17/2017. The patient states that it is a new partner, but she had been with them a few years ago. She denies fever, chills or any other symptoms or complaints at this time.  Patient describes low back pain and spasms related to her discomfort which is likely secondary to the degree of discomfort  ROS:  ROS +fissure +vaginal discharge +back pain -fever -chills All systems are negative except as noted in the HPI and PMH.   Pertinent History Reviewed:   Reviewed: Significant for dysmenorrhea, menorrhagia, and ovarian cyst Medical         Past Medical History:  Diagnosis Date  . ADD (attention deficit disorder)   . Anemia 2011   2o to GASTRIC BYPASS  . Anginal pain ([place])   . Anxiety   . Blood transfusion without reported diagnosis   . Chronic daily headache   . Depression   . Depression   . Dysmenorrhea 09/18/2013  . Dysrhythmia   . Elevated liver enzymes JUL 2011ALK PHOS 111-127 AST  143-267 ALT  213-321T BILI 0.6  ALB  3.7-4.06 Jun 2011 ALK PHOS 118 AST 24 ALT 42 T BILI 0.4 ALB 3.9  . Encounter for drug rehabilitation    at behavioral health for opioid addiction about 4 years ago  . Family history of adverse reaction to anesthesia    'dad had to be kept on pump for breathing for morphine'  . Fatty liver   . Fibroid 01/18/2017  . Fibromyalgia   . Gastric bypass status for obesity   . Gastritis JULY 2011  . Heart murmur   . 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 History of Holter  monitoring   . Hx of opioid abuse    for about 4 years, about 4 years ago  . Interstitial cystitis   . Iron deficiency anemia 07/23/2010  . Irritable bowel syndrome 2012 DIARRHEA   JUN 2012 TTG IgA 14.9  . IUD FEB 2010  . Lupus   . Menorrhagia 07/18/2013  . Migraines   . Obesity (BMI 30-39.9) 2011 228 LBS BMI 36.8  . Ovarian cyst   . Patient desires pregnancy 09/18/2013  . Polyneuropathy   . PONV (postoperative nausea and vomiting) seizure post-operatively  . Potassium (K) deficiency   . Pregnant 12/25/2013  . Psychiatric pseudoseizure   . RLQ abdominal pain 07/18/2013  . Sciatica of left side 11/14/2014  . 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 Stress 09/18/2013                              Surgical Hx:    Past Surgical History:  Procedure Laterality Date  . BIOPSY  04/10/2015   Procedure: BIOPSY;  Surgeon: Daneil Dolin, MD;  Location: [facility];  Service: Endoscopy;;  . cath self every nite     for sodium bicarb injection (discontinued 2013)  .  CHOLECYSTECTOMY  2005   biliary dyskinesia  . COLONOSCOPY  JUN 2012 ABD PN/DIARRHEA WITH PROPOFOL   NL COLON  . DILATION AND CURETTAGE OF UTERUS    . DILITATION & CURRETTAGE/HYSTROSCOPY WITH NOVASURE ABLATION N/A 03/24/2017   Procedure: DILATATION & CURETTAGE/HYSTEROSCOPY WITH NOVASURE ENDOMETRIAL ABLATION;  Surgeon: Jonnie Kind, MD;  Location: [facility];  Service: Gynecology;  Laterality: N/A;  . ESOPHAGEAL DILATION N/A 04/10/2015   Procedure: ESOPHAGEAL DILATION WITH 54FR MALONEY DILATOR;  Surgeon: Daneil Dolin, MD;  Location: [facility];  Service: Endoscopy;  Laterality: N/A;  . ESOPHAGOGASTRODUODENOSCOPY    . ESOPHAGOGASTRODUODENOSCOPY (EGD) WITH PROPOFOL N/A 04/10/2015   Procedure: ESOPHAGOGASTRODUODENOSCOPY (EGD) WITH PROPOFOL;  Surgeon: Daneil Dolin, MD;  Location: [facility];  Service: Endoscopy;  Laterality: N/A;  . ESOPHAGOGASTRODUODENOSCOPY (EGD) WITH PROPOFOL N/A 12/06/2016   Procedure: ESOPHAGOGASTRODUODENOSCOPY (EGD) WITH  PROPOFOL;  Surgeon: Danie Binder, MD;  Location: [facility];  Service: Endoscopy;  Laterality: N/A;  . GAB  2007   in [place]-POUCH 5 CM  . GASTRIC BYPASS  06/2006  . HYSTEROSCOPY W/D&C N/A 09/12/2014   Procedure: DILATATION AND CURETTAGE /HYSTEROSCOPY;  Surgeon: Jonnie Kind, MD;  Location: [facility];  Service: Gynecology;  Laterality: N/A;  . LAPAROSCOPIC TUBAL LIGATION Bilateral 03/24/2017   Procedure: LAPAROSCOPIC TUBAL LIGATION ([place] Rings);  Surgeon: Jonnie Kind, MD;  Location: [facility];  Service: Gynecology;  Laterality: Bilateral;  . REPAIR VAGINAL CUFF N/A 07/30/2014   Procedure: REPAIR VAGINAL CUFF;  Surgeon: Mora Bellman, MD;  Location: [facility];  Service: Gynecology;  Laterality: N/A;  . SAVORY DILATION  06/20/2012   Dr. Barnie Alderman gastritis/Ulcer in the mid jejunum. Empiric dilation.   . SURAL NERVE BX Left 02/25/2016   Procedure: LEFT SURAL NERVE BIOPSY;  Surgeon: Jovita Gamma, MD;  Location: [facility];  Service: Neurosurgery;  Laterality: Left;  Left sural nerve biopsy  . TONSILLECTOMY    . TONSILLECTOMY AND ADENOIDECTOMY    . UPPER GASTROINTESTINAL ENDOSCOPY  JULY 2011 NAUSEA-D125,V6, PH 25   Bx; GASTRITIS, POUCH-5 CM LONG  . WISDOM TOOTH EXTRACTION     Medications: Reviewed & Updated - see associated section                       Current Outpatient Medications:  .  amphetamine-dextroamphetamine (ADDERALL XR) 30 MG 24 hr capsule, Take 1 capsule (30 mg total) by mouth every morning., Disp: 30 capsule, Rfl: 0 .  carbamazepine (TEGRETOL) 200 MG tablet, Take two tablets at bedtime, Disp: 60 tablet, Rfl: 2 .  clonazePAM (KLONOPIN) 0.5 MG tablet, Take 1 tablet (0.5 mg total) by mouth 3 (three) times daily as needed for anxiety., Disp: 90 tablet, Rfl: 2 .  DULoxetine (CYMBALTA) 60 MG capsule, Take 2 capsules (120 mg total) by mouth at bedtime., Disp: 60 capsule, Rfl: 2 .  metroNIDAZOLE (FLAGYL) 500 MG tablet, Take 1 tablet (500 mg total) by mouth 2 (two) times  daily., Disp: 14 tablet, Rfl: 0 .  pantoprazole (PROTONIX) 40 MG tablet, Take 1 tablet (40 mg total) by mouth daily., Disp: 90 tablet, Rfl: 3 .  Pediatric Multivitamins-Iron (FLINTSTONES PLUS IRON PO), Take 1 tablet by mouth 2 (two) times daily. , Disp: , Rfl:  .  phenazopyridine (PYRIDIUM) 100 MG tablet, TAKE (1) TABLET BY MOUTH (3) TIMES A DAY AS NEEDED FOR PAIN., Disp: 21 tablet, Rfl: 0 .  PROAIR HFA 108 (90 Base) MCG/ACT inhaler, INHALE 2 PUFFS INTO THE LUNGS EVERY 6  HOURS AS NEEDED., Disp: 8.5 g, Rfl: 0 .  fluconazole (DIFLUCAN) 150 MG tablet, TAKE 1 TABLET BY MOUTH ONCE. (Patient not taking: Reported on 12/19/2017), Disp: 1 tablet, Rfl: 0 .  fluconazole (DIFLUCAN) 150 MG tablet, Take 1 tablet (150 mg total) by mouth daily., Disp: 1 tablet, Rfl: 0 .  HYDROcodone Bitartrate ER (HYSINGLA ER) 20 MG T24A, Take 40 mg by mouth at bedtime. , Disp: , Rfl:  .  oxyCODONE (ROXICODONE) 15 MG immediate release tablet, Take 15 mg by mouth every 4 (four) hours as needed for pain., Disp: , Rfl:  .  promethazine (PHENERGAN) 25 MG tablet, Take 1 tablet (25 mg total) by mouth every 6 (six) hours as needed., Disp: 30 tablet, Rfl: 0 .  topiramate (TOPAMAX) 50 MG tablet, TAKE 1 TABLET BY MOUTH EACH MORNING AND 2 TABLETS AT BEDTIME., Disp: 90 tablet, Rfl: 2   Social History: Reviewed -  reports that she has been smoking e-cigarettes.  She has a 1.50 pack-year smoking history. she has never used smokeless tobacco.  Objective Findings:  Vitals: Blood pressure 118/80, pulse 95, weight 204 lb 3.2 oz (92.6 kg), last menstrual period 11/16/2017.  PHYSICAL EXAMINATION General appearance - alert, well appearing, and in no distress and oriented to person, place, and time Mental status - alert, oriented to person, place, and time, normal mood, behavior, speech, dress, motor activity, and thought processes, affect appropriate to mood  PELVIC Vulva -   Vagina - ulcerative regions on posterior fourchette, marked tenderness,  slight discharge, GC/ Chlamydia tested Speculum exam not done.  Assessment & Plan:   A:  1. Herpes type 2  P:  1. Rx Valacyclovir 1 gram qd x 5, Rx Toradol, Rx Flexeril 2. Good hand hygiene 3. F/u 3-4 days   By signing my name below, I, Margit Banda, attest that this documentation has been prepared under the direction and in the presence of Jonnie Kind, MD. Electronically Signed: Margit Banda, Medical Scribe. 12/19/17. 2:51 PM.  .Alecia Lemming I personally performed the services described in this documentation, which was SCRIBED in my presence. The recorded information has been reviewed and considered accurate. It has been edited as necessary during review. Jonnie Kind, MD

## 2017-12-20 ENCOUNTER — Telehealth: Payer: Self-pay | Admitting: *Deleted

## 2017-12-20 NOTE — Telephone Encounter (Signed)
Patient called stating she was at the pharmacy to pick up her meds and none of them were there. Spoke to [place] at pharmacy who stated patient already picked up two of them.  Informed Toradol was printed and was the only other medication not sent. Verbal order given for Toradol.

## 2017-12-21 ENCOUNTER — Telehealth: Payer: Self-pay | Admitting: *Deleted

## 2017-12-21 MED ORDER — PHENAZOPYRIDINE HCL 200 MG PO TABS: 200.0000 mg | ORAL_TABLET | Freq: Three times a day (TID) | ORAL | 0 refills | Status: DC

## 2017-12-21 NOTE — Telephone Encounter (Signed)
Informed patient Pyridium was sent to pharmacy. Patient also stated that Dr Glo Herring sent in the wrong flexeril prescription and requested it be changed. Informed her that he had never written for tizandine in the past but since she already had that prescription filled and with her, we could not send in another prescription not knowing if she would take both.  Patient stated she did not have a pain clinic appointment until Monday and would be out of pain medication. Informed he would not refill those meds because she was in a contract.  Asked what she was supposed to do when she ran out of Flexeril since he only wrote for 15 with no refills. Advised Dr Glo Herring had a reason for the things he did but to call back when she needed a refill and we could see if he would refill.

## 2017-12-21 NOTE — Telephone Encounter (Signed)
Patient called stating she is still having a lot of pain when she urinates. Informed patient urine was normal at last visit. She is requesting pyridium.  Will discuss with Dr Glo Herring.

## 2017-12-22 LAB — HERPES SIMPLEX VIRUS CULTURE

## 2017-12-22 LAB — GC/CHLAMYDIA PROBE AMP
CHLAMYDIA, DNA PROBE: NEGATIVE
Neisseria gonorrhoeae by PCR: NEGATIVE

## 2017-12-23 ENCOUNTER — Encounter: Payer: Self-pay | Admitting: Obstetrics and Gynecology

## 2017-12-23 ENCOUNTER — Other Ambulatory Visit: Payer: Self-pay | Admitting: *Deleted

## 2017-12-23 ENCOUNTER — Ambulatory Visit: Payer: Medicaid Other | Admitting: Obstetrics and Gynecology

## 2017-12-23 VITALS — BP 120/80 | HR 98 | Ht 66.0 in | Wt 203.2 lb

## 2017-12-23 DIAGNOSIS — Z1159 Encounter for screening for other viral diseases: Secondary | ICD-10-CM

## 2017-12-23 DIAGNOSIS — R300 Dysuria: Secondary | ICD-10-CM

## 2017-12-23 DIAGNOSIS — N898 Other specified noninflammatory disorders of vagina: Secondary | ICD-10-CM | POA: Diagnosis not present

## 2017-12-23 DIAGNOSIS — B009 Herpesviral infection, unspecified: Secondary | ICD-10-CM

## 2017-12-23 MED ORDER — TIZANIDINE HCL 4 MG PO TABS: 4.0000 mg | ORAL_TABLET | Freq: Four times a day (QID) | ORAL | 0 refills | Status: DC | PRN

## 2017-12-23 MED ORDER — KETOROLAC TROMETHAMINE 10 MG PO TABS: 10.0000 mg | ORAL_TABLET | Freq: Four times a day (QID) | ORAL | 0 refills | Status: DC | PRN

## 2017-12-23 MED ORDER — VALACYCLOVIR HCL 500 MG PO TABS: 500.0000 mg | ORAL_TABLET | Freq: Every day | ORAL | 4 refills | Status: DC

## 2017-12-23 NOTE — Progress Notes (Addendum)
[facility] Visit  12/23/2017            Patient name: Penny [name] MRN [id number]  Date of birth: 1986-01-17  CC & HPI:  Penny [name] is a 32 y.o. female presenting today for f/u after dx of herpes type 2 during last office visit on 12/19/2017, where she was seen for a painful fissure near vaginal opening. Pt has complaints today of burning when she urinates. She has associated symptoms of yellow discharge and continued pain. She has tried using a diaper cream after she urinates with no relief. She has been using wash cloths as well with no relief. No alleviating factors noted. She had a bad outbreak this past Christmas where she experienced swollen hands and feet. She notes increased breaking out around her face recently. Pt is interested in taking the daily pill to suppress outbreaks.  ROS:  ROS  (+) burning with urinating (+) yellow vaginal discharge (+) pain (-) fever  All systems are negative except as noted in the HPI and PMH.    Pertinent History Reviewed:   Reviewed: Significant for menorrhagia, ovarian cyst, herpes type 2 Medical         Past Medical History:  Diagnosis Date  . ADD (attention deficit disorder)   . Anemia 2011   2o to GASTRIC BYPASS  . Anginal pain ([place])   . Anxiety   . Blood transfusion without reported diagnosis   . Chronic daily headache   . Depression   . Depression   . Dysmenorrhea 09/18/2013  . Dysrhythmia   . Elevated liver enzymes JUL 2011ALK PHOS 111-127 AST  143-267 ALT  213-321T BILI 0.6  ALB  3.7-4.06 Jun 2011 ALK PHOS 118 AST 24 ALT 42 T BILI 0.4 ALB 3.9  . Encounter for drug rehabilitation    at behavioral health for opioid addiction about 4 years ago  . Family history of adverse reaction to anesthesia    'dad had to be kept on pump for breathing for morphine'  . Fatty liver   . Fibroid 01/18/2017  . Fibromyalgia   . Gastric bypass status for obesity   . Gastritis JULY 2011  . Heart murmur   . Hereditary and  idiopathic peripheral neuropathy 01/15/2015  . History of Holter monitoring   . Hx of opioid abuse    for about 4 years, about 4 years ago  . Interstitial cystitis   . Iron deficiency anemia 07/23/2010  . Irritable bowel syndrome 2012 DIARRHEA   JUN 2012 TTG IgA 14.9  . IUD FEB 2010  . Lupus   . Menorrhagia 07/18/2013  . Migraines   . Obesity (BMI 30-39.9) 2011 228 LBS BMI 36.8  . Ovarian cyst   . Patient desires pregnancy 09/18/2013  . Polyneuropathy   . PONV (postoperative nausea and vomiting) seizure post-operatively  . Potassium (K) deficiency   . Pregnant 12/25/2013  . Psychiatric pseudoseizure   . RLQ abdominal pain 07/18/2013  . Sciatica of left side 11/14/2014  . 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 Stress 09/18/2013                              Surgical Hx:    Past Surgical History:  Procedure Laterality Date  . BIOPSY  04/10/2015   Procedure: BIOPSY;  Surgeon: Daneil Dolin, MD;  Location: [facility];  Service: Endoscopy;;  . cath self every  nite     for sodium bicarb injection (discontinued 2013)  . CHOLECYSTECTOMY  2005   biliary dyskinesia  . COLONOSCOPY  JUN 2012 ABD PN/DIARRHEA WITH PROPOFOL   NL COLON  . DILATION AND CURETTAGE OF UTERUS    . DILITATION & CURRETTAGE/HYSTROSCOPY WITH NOVASURE ABLATION N/A 03/24/2017   Procedure: DILATATION & CURETTAGE/HYSTEROSCOPY WITH NOVASURE ENDOMETRIAL ABLATION;  Surgeon: Jonnie Kind, MD;  Location: [facility];  Service: Gynecology;  Laterality: N/A;  . ESOPHAGEAL DILATION N/A 04/10/2015   Procedure: ESOPHAGEAL DILATION WITH 54FR MALONEY DILATOR;  Surgeon: Daneil Dolin, MD;  Location: [facility];  Service: Endoscopy;  Laterality: N/A;  . ESOPHAGOGASTRODUODENOSCOPY    . ESOPHAGOGASTRODUODENOSCOPY (EGD) WITH PROPOFOL N/A 04/10/2015   Procedure: ESOPHAGOGASTRODUODENOSCOPY (EGD) WITH PROPOFOL;  Surgeon: Daneil Dolin, MD;  Location: [facility];  Service: Endoscopy;  Laterality: N/A;  . ESOPHAGOGASTRODUODENOSCOPY (EGD) WITH PROPOFOL N/A  12/06/2016   Procedure: ESOPHAGOGASTRODUODENOSCOPY (EGD) WITH PROPOFOL;  Surgeon: Danie Binder, MD;  Location: [facility];  Service: Endoscopy;  Laterality: N/A;  . GAB  2007   in [place]-POUCH 5 CM  . GASTRIC BYPASS  06/2006  . HYSTEROSCOPY W/D&C N/A 09/12/2014   Procedure: DILATATION AND CURETTAGE /HYSTEROSCOPY;  Surgeon: Jonnie Kind, MD;  Location: [facility];  Service: Gynecology;  Laterality: N/A;  . LAPAROSCOPIC TUBAL LIGATION Bilateral 03/24/2017   Procedure: LAPAROSCOPIC TUBAL LIGATION ([place] Rings);  Surgeon: Jonnie Kind, MD;  Location: [facility];  Service: Gynecology;  Laterality: Bilateral;  . REPAIR VAGINAL CUFF N/A 07/30/2014   Procedure: REPAIR VAGINAL CUFF;  Surgeon: Mora Bellman, MD;  Location: [facility];  Service: Gynecology;  Laterality: N/A;  . SAVORY DILATION  06/20/2012   Dr. Barnie Alderman gastritis/Ulcer in the mid jejunum. Empiric dilation.   . SURAL NERVE BX Left 02/25/2016   Procedure: LEFT SURAL NERVE BIOPSY;  Surgeon: Jovita Gamma, MD;  Location: [facility];  Service: Neurosurgery;  Laterality: Left;  Left sural nerve biopsy  . TONSILLECTOMY    . TONSILLECTOMY AND ADENOIDECTOMY    . UPPER GASTROINTESTINAL ENDOSCOPY  JULY 2011 NAUSEA-D125,V6, PH 25   Bx; GASTRITIS, POUCH-5 CM LONG  . WISDOM TOOTH EXTRACTION     Medications: Reviewed & Updated - see associated section                       Current Outpatient Medications:  .  amphetamine-dextroamphetamine (ADDERALL XR) 30 MG 24 hr capsule, Take 1 capsule (30 mg total) by mouth every morning., Disp: 30 capsule, Rfl: 0 .  carbamazepine (TEGRETOL) 200 MG tablet, Take two tablets at bedtime, Disp: 60 tablet, Rfl: 2 .  clonazePAM (KLONOPIN) 0.5 MG tablet, Take 1 tablet (0.5 mg total) by mouth 3 (three) times daily as needed for anxiety., Disp: 90 tablet, Rfl: 2 .  cyclobenzaprine (FLEXERIL) 10 MG tablet, Take 1 tablet (10 mg total) by mouth 3 (three) times daily as needed for muscle spasms., Disp: 15 tablet, Rfl:  1 .  DULoxetine (CYMBALTA) 60 MG capsule, Take 2 capsules (120 mg total) by mouth at bedtime., Disp: 60 capsule, Rfl: 2 .  fluconazole (DIFLUCAN) 150 MG tablet, TAKE 1 TABLET BY MOUTH ONCE. (Patient not taking: Reported on 12/19/2017), Disp: 1 tablet, Rfl: 0 .  fluconazole (DIFLUCAN) 150 MG tablet, Take 1 tablet (150 mg total) by mouth daily., Disp: 1 tablet, Rfl: 0 .  HYDROcodone Bitartrate ER (HYSINGLA ER) 20 MG T24A, Take 40 mg by mouth at bedtime. , Disp: , Rfl:  .  ketorolac (TORADOL) 10 MG tablet, Take 1 tablet (10 mg total) by mouth every 6 (six) hours as needed., Disp: 20 tablet, Rfl: 0 .  metroNIDAZOLE (FLAGYL) 500 MG tablet, Take 1 tablet (500 mg total) by mouth 2 (two) times daily., Disp: 14 tablet, Rfl: 0 .  oxyCODONE (ROXICODONE) 15 MG immediate release tablet, Take 15 mg by mouth every 4 (four) hours as needed for pain., Disp: , Rfl:  .  pantoprazole (PROTONIX) 40 MG tablet, Take 1 tablet (40 mg total) by mouth daily., Disp: 90 tablet, Rfl: 3 .  Pediatric Multivitamins-Iron (FLINTSTONES PLUS IRON PO), Take 1 tablet by mouth 2 (two) times daily. , Disp: , Rfl:  .  phenazopyridine (PYRIDIUM) 200 MG tablet, Take 1 tablet (200 mg total) by mouth 3 (three) times daily with meals. Take for 3 days, Disp: 10 tablet, Rfl: 0 .  PROAIR HFA 108 (90 Base) MCG/ACT inhaler, INHALE 2 PUFFS INTO THE LUNGS EVERY 6 HOURS AS NEEDED., Disp: 8.5 g, Rfl: 0 .  promethazine (PHENERGAN) 25 MG tablet, Take 1 tablet (25 mg total) by mouth every 6 (six) hours as needed., Disp: 30 tablet, Rfl: 0 .  topiramate (TOPAMAX) 50 MG tablet, TAKE 1 TABLET BY MOUTH EACH MORNING AND 2 TABLETS AT BEDTIME., Disp: 90 tablet, Rfl: 2 .  valACYclovir (VALTREX) 1000 MG tablet, Take 1 tablet (1,000 mg total) by mouth daily., Disp: 5 tablet, Rfl: 2  The provider spent over 25 minutes with the visit , including previsit review, and documentation,with >than 50% spent in counseling and coordination of care.  Social History: Reviewed -   reports that she has been smoking e-cigarettes.  She has a 1.50 pack-year smoking history. she has never used smokeless tobacco.  Objective Findings:  Vitals: There were no vitals taken for this visit.  Physical Examination: General appearance - alert, well appearing, and in no distress Mental status - alert, oriented to person, place, and time Pelvic - Vagina: ulcerative lesions improved, but not completely healed Speculum exam not done  Assessment & Plan:   A:  1. Herpes type 2, healing normally, low pain threshold and anxiety patient  P:  1. Refill Acyclovir for 1 year prescription, stop Flexeril and rx Tizanidine, renew Toradol 2. Good hand hygiene 3 lengthy discussion , as always over implications of HSV, risk of transmission to partner or children,    By signing my name below, I, Izna Ahmed, attest that this documentation has been prepared under the direction and in the presence of Jonnie Kind, MD. Electronically Signed: Jabier Gauss, Medical Scribe. 12/23/17. 11:09 AM.  I personally performed the services described in this documentation, which was SCRIBED in my presence. The recorded information has been reviewed and considered accurate. It has been edited as necessary during review. Jonnie Kind, MD

## 2017-12-24 ENCOUNTER — Other Ambulatory Visit: Payer: Self-pay | Admitting: Family Medicine

## 2017-12-26 NOTE — Telephone Encounter (Signed)
Please use this I do not do pain medications for this patient

## 2017-12-29 ENCOUNTER — Telehealth: Payer: Self-pay | Admitting: Obstetrics and Gynecology

## 2017-12-29 ENCOUNTER — Telehealth: Payer: Self-pay | Admitting: *Deleted

## 2017-12-29 MED ORDER — KETOROLAC TROMETHAMINE 10 MG PO TABS: 10.0000 mg | ORAL_TABLET | Freq: Four times a day (QID) | ORAL | 0 refills | Status: DC | PRN

## 2017-12-29 NOTE — Telephone Encounter (Signed)
Pt informed that prescription for ketorolac had been faxed to [facility].

## 2017-12-29 NOTE — Telephone Encounter (Signed)
Prescription was refilled at last visit but prescription did not print. Refilled per Dr Glo Herring and will send to pharmacy.

## 2017-12-29 NOTE — Telephone Encounter (Signed)
Patient called stating that her  ketorolac was never called into her pharmacy. Please contact pt

## 2018-01-03 ENCOUNTER — Telehealth: Payer: Self-pay | Admitting: *Deleted

## 2018-01-03 NOTE — Telephone Encounter (Signed)
Pt called for appt today. She fell last Thursday and she started having bilateral leg numbness and tingling the next day. She states it is getting worse. Having pain from her low back down and today she started having blurry vision. Consult with dr Nicki Reaper. No appts available today. Dr Nicki Reaper recommends she go to ED for treatment. Pt verbalized understanding.

## 2018-01-05 ENCOUNTER — Other Ambulatory Visit: Payer: Self-pay | Admitting: *Deleted

## 2018-01-05 MED ORDER — FLUCONAZOLE 150 MG PO TABS: ORAL_TABLET | ORAL | 0 refills | Status: DC

## 2018-01-11 ENCOUNTER — Encounter: Payer: Self-pay | Admitting: Family Medicine

## 2018-01-11 ENCOUNTER — Other Ambulatory Visit: Payer: Self-pay

## 2018-01-11 ENCOUNTER — Emergency Department (HOSPITAL_COMMUNITY)
Admission: EM | Admit: 2018-01-11 | Discharge: 2018-01-11 | Disposition: A | Discharge status: Home / Self Care | Payer: Medicaid Other | Attending: Emergency Medicine | Admitting: Emergency Medicine

## 2018-01-11 ENCOUNTER — Encounter (HOSPITAL_COMMUNITY): Payer: Self-pay | Admitting: Emergency Medicine

## 2018-01-11 ENCOUNTER — Ambulatory Visit (INDEPENDENT_AMBULATORY_CARE_PROVIDER_SITE_OTHER): Payer: Medicaid Other | Admitting: Family Medicine

## 2018-01-11 ENCOUNTER — Emergency Department (HOSPITAL_COMMUNITY): Payer: Medicaid Other

## 2018-01-11 VITALS — BP 120/80 | HR 81

## 2018-01-11 DIAGNOSIS — Z79899 Other long term (current) drug therapy: Secondary | ICD-10-CM | POA: Diagnosis not present

## 2018-01-11 DIAGNOSIS — N3000 Acute cystitis without hematuria: Secondary | ICD-10-CM | POA: Diagnosis not present

## 2018-01-11 DIAGNOSIS — Z91040 Latex allergy status: Secondary | ICD-10-CM | POA: Diagnosis not present

## 2018-01-11 DIAGNOSIS — Z131 Encounter for screening for diabetes mellitus: Secondary | ICD-10-CM | POA: Diagnosis not present

## 2018-01-11 DIAGNOSIS — N3090 Cystitis, unspecified without hematuria: Secondary | ICD-10-CM | POA: Diagnosis not present

## 2018-01-11 DIAGNOSIS — R202 Paresthesia of skin: Secondary | ICD-10-CM | POA: Diagnosis not present

## 2018-01-11 DIAGNOSIS — R200 Anesthesia of skin: Secondary | ICD-10-CM | POA: Diagnosis not present

## 2018-01-11 DIAGNOSIS — R109 Unspecified abdominal pain: Secondary | ICD-10-CM | POA: Diagnosis not present

## 2018-01-11 DIAGNOSIS — F17210 Nicotine dependence, cigarettes, uncomplicated: Secondary | ICD-10-CM | POA: Diagnosis not present

## 2018-01-11 DIAGNOSIS — R5383 Other fatigue: Secondary | ICD-10-CM | POA: Diagnosis not present

## 2018-01-11 DIAGNOSIS — R1030 Lower abdominal pain, unspecified: Secondary | ICD-10-CM | POA: Diagnosis present

## 2018-01-11 DIAGNOSIS — E876 Hypokalemia: Secondary | ICD-10-CM | POA: Diagnosis not present

## 2018-01-11 LAB — POCT GLUCOSE (DEVICE FOR HOME USE): POC Glucose: 112 mg/dl — AB (ref 70–99)

## 2018-01-11 LAB — URINALYSIS, ROUTINE W REFLEX MICROSCOPIC
Bilirubin Urine: NEGATIVE
Glucose, UA: NEGATIVE mg/dL
Hgb urine dipstick: NEGATIVE
Ketones, ur: 20 mg/dL — AB
Nitrite: NEGATIVE
Protein, ur: 30 mg/dL — AB
Specific Gravity, Urine: 1.017 (ref 1.005–1.030)
pH: 9 — ABNORMAL HIGH (ref 5.0–8.0)

## 2018-01-11 LAB — COMPREHENSIVE METABOLIC PANEL
ALT: 25 U/L (ref 14–54)
AST: 27 U/L (ref 15–41)
Albumin: 4.2 g/dL (ref 3.5–5.0)
Alkaline Phosphatase: 92 U/L (ref 38–126)
Anion gap: 13 (ref 5–15)
BUN: 9 mg/dL (ref 6–20)
CO2: 18 mmol/L — ABNORMAL LOW (ref 22–32)
Calcium: 9.4 mg/dL (ref 8.9–10.3)
Chloride: 109 mmol/L (ref 101–111)
Creatinine, Ser: 0.64 mg/dL (ref 0.44–1.00)
GFR calc Af Amer: >60 mL/min (ref >60)
GFR calc non Af Amer: >60 mL/min (ref >60)
Glucose, Bld: 108 mg/dL — ABNORMAL HIGH (ref 65–99)
Potassium: 3 mmol/L — ABNORMAL LOW (ref 3.5–5.1)
Sodium: 140 mmol/L (ref 135–145)
Total Bilirubin: 1 mg/dL (ref 0.3–1.2)
Total Protein: 7.3 g/dL (ref 6.5–8.1)

## 2018-01-11 LAB — LIPASE, BLOOD: Lipase: 45 U/L (ref 11–51)

## 2018-01-11 LAB — CBC
HCT: 38.5 % (ref 36.0–46.0)
Hemoglobin: 13.3 g/dL (ref 12.0–15.0)
MCH: 32 pg (ref 26.0–34.0)
MCHC: 34.5 g/dL (ref 30.0–36.0)
MCV: 92.5 fL (ref 78.0–100.0)
Platelets: 231 10*3/uL (ref 150–400)
RBC: 4.16 MIL/uL (ref 3.87–5.11)
RDW: 12.2 % (ref 11.5–15.5)
WBC: 6.9 10*3/uL (ref 4.0–10.5)

## 2018-01-11 LAB — I-STAT BETA HCG BLOOD, ED (MC, WL, AP ONLY): I-stat hCG, quantitative: <5 m[IU]/mL (ref <5)

## 2018-01-11 MED ORDER — SODIUM CHLORIDE 0.9 % IV BOLUS (SEPSIS)
1000.0000 mL | Freq: Once | INTRAVENOUS | Status: AC
  Administered 2018-01-11: 1000 mL via INTRAVENOUS

## 2018-01-11 MED ORDER — CEPHALEXIN 500 MG PO CAPS: 500.0000 mg | ORAL_CAPSULE | Freq: Four times a day (QID) | ORAL | 0 refills | Status: DC

## 2018-01-11 MED ORDER — FLUCONAZOLE 150 MG PO TABS: 150.0000 mg | ORAL_TABLET | Freq: Once | ORAL | 0 refills | Status: AC

## 2018-01-11 MED ORDER — LORAZEPAM 2 MG/ML IJ SOLN
0.5000 mg | Freq: Once | INTRAMUSCULAR | Status: AC
  Administered 2018-01-11: 0.5 mg via INTRAVENOUS
  Filled 2018-01-11: qty 1

## 2018-01-11 MED ORDER — POTASSIUM CHLORIDE CRYS ER 20 MEQ PO TBCR
40.0000 meq | EXTENDED_RELEASE_TABLET | Freq: Once | ORAL | Status: AC
  Administered 2018-01-11: 40 meq via ORAL
  Filled 2018-01-11: qty 2

## 2018-01-11 MED ORDER — MAGNESIUM OXIDE 400 (241.3 MG) MG PO TABS
400.0000 mg | ORAL_TABLET | Freq: Once | ORAL | Status: AC
  Administered 2018-01-11: 400 mg via ORAL
  Filled 2018-01-11: qty 1

## 2018-01-11 NOTE — ED Provider Notes (Signed)
Pt received at sign out with UA pending. Udip below. Pt does endorse dysuria and urinary frequency; Will rx keflex. Pt also requesting rx diflucan. Dx and testing d/w pt and family.  Questions answered.  Verb understanding, agreeable to d/c home with outpt f/u.    Results for orders placed or performed during the hospital encounter of 01/11/18  Lipase, blood  Result Value Ref Range   Lipase 45 11 - 51 U/L  Comprehensive metabolic panel  Result Value Ref Range   Sodium 140 135 - 145 mmol/L   Potassium 3.0 (L) 3.5 - 5.1 mmol/L   Chloride 109 101 - 111 mmol/L   CO2 18 (L) 22 - 32 mmol/L   Glucose, Bld 108 (H) 65 - 99 mg/dL   BUN 9 6 - 20 mg/dL   Creatinine, Ser 0.64 0.44 - 1.00 mg/dL   Calcium 9.4 8.9 - 10.3 mg/dL   Total Protein 7.3 6.5 - 8.1 g/dL   Albumin 4.2 3.5 - 5.0 g/dL   AST 27 15 - 41 U/L   ALT 25 14 - 54 U/L   Alkaline Phosphatase 92 38 - 126 U/L   Total Bilirubin 1.0 0.3 - 1.2 mg/dL   GFR calc non Af Amer >60 >60 mL/min   GFR calc Af Amer >60 >60 mL/min   Anion gap 13 5 - 15  CBC  Result Value Ref Range   WBC 6.9 4.0 - 10.5 K/uL   RBC 4.16 3.87 - 5.11 MIL/uL   Hemoglobin 13.3 12.0 - 15.0 g/dL   HCT 38.5 36.0 - 46.0 %   MCV 92.5 78.0 - 100.0 fL   MCH 32.0 26.0 - 34.0 pg   MCHC 34.5 30.0 - 36.0 g/dL   RDW 12.2 11.5 - 15.5 %   Platelets 231 150 - 400 K/uL  Urinalysis, Routine w reflex microscopic  Result Value Ref Range   Color, Urine AMBER (A) YELLOW   APPearance HAZY (A) CLEAR   Specific Gravity, Urine 1.017 1.005 - 1.030   pH 9.0 (H) 5.0 - 8.0   Glucose, UA NEGATIVE NEGATIVE mg/dL   Hgb urine dipstick NEGATIVE NEGATIVE   Bilirubin Urine NEGATIVE NEGATIVE   Ketones, ur 20 (A) NEGATIVE mg/dL   Protein, ur 30 (A) NEGATIVE mg/dL   Nitrite NEGATIVE NEGATIVE   Leukocytes, UA SMALL (A) NEGATIVE   RBC / HPF 0-5 0 - 5 RBC/hpf   WBC, UA 6-30 0 - 5 WBC/hpf   Bacteria, UA RARE (A) NONE SEEN   Squamous Epithelial / LPF 6-30 (A) NONE SEEN   Mucus PRESENT    Hyaline  Casts, UA PRESENT   I-Stat Beta hCG blood, ED ([facility], [facility], [facility] only)  Result Value Ref Range   I-stat hCG, quantitative <5.0 <5 mIU/mL   Comment 3           Dg Chest 2 View Result Date: 01/11/2018 CLINICAL DATA:  32 year old female with dyspnea since 01/09/2018. EXAM: CHEST  2 VIEW COMPARISON:  Chest x-ray 12/17/2016. FINDINGS: Lung volumes are normal. No consolidative airspace disease. No pleural effusions. No pneumothorax. No pulmonary nodule or mass noted. Pulmonary vasculature and the cardiomediastinal silhouette are within normal limits. IMPRESSION: No radiographic evidence of acute cardiopulmonary disease. Electronically Signed   By: [name] M.D.   On: 01/11/2018 15:07       Francine Graven, DO 01/11/18 1748

## 2018-01-11 NOTE — Progress Notes (Signed)
The patient presented to our office relatively lethargic.  The nurse brought to my attention this patient for urgent evaluation.  She was in room 2 when I saw her.  The reason for her visit was having some congestion cough and chills no reported fever  This patient has a history of psychiatric illness, pseudoseizures, painful peripheral neuropathy of the legs, and a history of intermittent spells of lethargy and poor muscle control that resolved sometimes over hours sometimes over days without explanation.  She has been seen by neurology and other specialist.  This patient used to be prescribed pain medication from our practice but we stopped this a long time ago because of inconsistencies in how she is kept up and took the medication.  She currently according to the drug registry sees pain management and [facility] with that prescribes her long-acting pain medicine.  Also she recently saw Dr. Harrington Challenger psychiatry who prescribed clonazepam.  She just received a significant amount 2 days ago.  According to family they cannot find the pill bottles at home.  Upon presentation to the office the patient started becoming more lethargic and stating she could not stand.  A wheelchair was used to bring her back into the examination area.  At that point she would only say a few words and otherwise close her eyes.  Her breathing was normal and her heart rate was normal.  O2 saturation was normal at 100% heart rate 80.  Respiratory rate was normal.  Pupils somewhat dilated but responsive.  Muscle tone in both arms and legs were diminished.  Patient was able to help herself move from the wheelchair to the exam table.  911 was immediately called upon presentation to the examination area.  The patient was not able to give [name] any history.  She did make a couple short statements.  Her vital signs and examination remained stable until EMS arrived.  Patient was safely transferred to EMS stretcher and EMS took the patient to the  ER.  ER MD was spoken with.  It is hard to know the cause but patient certainly has multiple issues that could be playing a role.  She was transferred to the ER for further evaluation regarding medication misuse/overuse versus underlying viral illness versus underlying chronic illness flareup.  Patient left the office in stable condition but guarded.   25 minutes was spent with the patient.  This statement verifies that 25 minutes was indeed spent with the patient. Greater than half the time was spent in discussion, counseling and answering questions  regarding the issues that the patient came in for today as reflected in the diagnosis (s) please refer to documentation for further details.

## 2018-01-11 NOTE — ED Notes (Signed)
Patient transported to X-ray 

## 2018-01-11 NOTE — ED Triage Notes (Signed)
Pt reports lower abd pain, constipation. Moderate anxiety noted in triage.

## 2018-01-11 NOTE — ED Notes (Signed)
Patient states she is unable to urinate at this time. 

## 2018-01-11 NOTE — ED Notes (Signed)
Pt c/o lower abdominal pain x few days.  Patient anxious and hyperventilating during assessment. No acute distress noted.

## 2018-01-11 NOTE — ED Provider Notes (Signed)
[facility] EMERGENCY DEPARTMENT Provider Note   CSN: [id number] Arrival date & time: 01/11/18  7014     History   Chief Complaint Chief Complaint  Patient presents with  . Abdominal Pain    HPI Penny [name] is a 32 y.o. female.  HPI   31yF sent from PCP for further evaluation after she was very difficult to arouse in the office.   When I her in the ED she was awake and alert but hyperventilating. As soon as I introduced myself she preemptively began insisting that she wasn't on drugs or withdrawing from them. She had a litany of complaints and is very anxious. She says she was so tired in her PCP's office because she was up all night and then felt drained after a hot shower this morning. Her father reports he had to physically support her to walk. Over the past several days she has been having chills and a cough. Nauseated. Headache. No sore throat. No acute urinary complaints. Says her mouth and hands are tingling.   Past Medical History:  Diagnosis Date  . ADD (attention deficit disorder)   . Anemia 2011   2o to GASTRIC BYPASS  . Anginal pain ([place])   . Anxiety   . Blood transfusion without reported diagnosis   . Chronic daily headache   . Depression   . Depression   . Dysmenorrhea 09/18/2013  . Dysrhythmia   . Elevated liver enzymes JUL 2011ALK PHOS 111-127 AST  143-267 ALT  213-321T BILI 0.6  ALB  3.7-4.06 Jun 2011 ALK PHOS 118 AST 24 ALT 42 T BILI 0.4 ALB 3.9  . Encounter for drug rehabilitation    at behavioral health for opioid addiction about 4 years ago  . Family history of adverse reaction to anesthesia    'dad had to be kept on pump for breathing for morphine'  . Fatty liver   . Fibroid 01/18/2017  . Fibromyalgia   . Gastric bypass status for obesity   . Gastritis JULY 2011  . Heart murmur   . 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 History of Holter monitoring   . Hx of opioid abuse    for about 4 years, about 4 years ago  .  Interstitial cystitis   . Iron deficiency anemia 07/23/2010  . Irritable bowel syndrome 2012 DIARRHEA   JUN 2012 TTG IgA 14.9  . IUD FEB 2010  . Lupus   . Menorrhagia 07/18/2013  . Migraines   . Obesity (BMI 30-39.9) 2011 228 LBS BMI 36.8  . Ovarian cyst   . Patient desires pregnancy 09/18/2013  . Polyneuropathy   . PONV (postoperative nausea and vomiting) seizure post-operatively  . Potassium (K) deficiency   . Pregnant 12/25/2013  . Psychiatric pseudoseizure   . RLQ abdominal pain 07/18/2013  . Sciatica of left side 11/14/2014  . 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 Stress 09/18/2013    Patient Active Problem List   Diagnosis Date Noted  . Herpes genitalis in women 12/19/2017  . Abnormal urine odor 03/30/2017  . Urinary tract infection with hematuria 03/30/2017  . Screening for genitourinary condition 03/30/2017  . Pain 03/30/2017  . Burning with urination 03/30/2017  . Family history of breast cancer in mother,uncertain BR CA status 02/28/2017  . Fibroid 01/18/2017  . Chronic pain syndrome 12/05/2016  . Folate deficiency 12/04/2016  . Nausea vomiting and diarrhea 12/04/2016  . Severe anemia 12/04/2016  . Symptomatic anemia 12/03/2016  .  Complaints of leg weakness   . Paresthesia   . Left-sided weakness   . Uncontrolled pain 01/24/2016  . Malnutrition of moderate degree 01/24/2016  . Neuropathy 01/23/2016  . Inability to walk 01/23/2016  . Paresthesias   . Bilateral leg numbness 11/26/2015  . 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 Post partum depression 05/09/2015  . Anastomotic ulcer   . Dysphagia   . Hematemesis 04/07/2015  . Intractable vomiting with nausea 04/07/2015  . Elevated liver enzymes   . Epigastric pain   . Pseudoseizure ([place]) 02/22/2015  . Seizure-like activity ([place]) 02/22/2015  . Fibromyalgia 02/18/2015  . Vulvar fissure 02/18/2015  . Clinical depression 02/01/2015  . Current smoker  01/29/2015  . Current tobacco use 01/29/2015  . 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 Leg weakness, bilateral 01/02/2015  . Gait disorder 01/02/2015  . Other symptoms and signs involving the musculoskeletal system 01/02/2015  . Abnormal gait 01/02/2015  . Cervicalgia 12/18/2014  . Sciatica of left side 11/14/2014  . Neuralgia neuritis, sciatic nerve 11/14/2014  . 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 Encounter for sterilization 09/09/2014  . Abdominal pain, acute, right lower quadrant 08/22/2014  . Headache, migraine 08/19/2014  . Seizure ([place]) 08/18/2014  . Encephalopathy 08/13/2014  . Headache 08/13/2014  . Altered mental status 08/13/2014  . Right leg numbness 07/31/2014  . Disturbance of skin sensation 07/31/2014  . Hypertension in pregnancy, transient 07/08/2014  . Previous gastric bypass affecting pregnancy, antepartum 05/22/2014  . 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 ASD (atrial septal defect), ostium secundum 05/17/2014  . Depression complicating pregnancy in second trimester, antepartum 05/09/2014  . Antepartum mental disorder in pregnancy 05/09/2014  . Rapid palpitations 02/18/2014  . H/O maternal third degree perineal laceration, currently pregnant 02/18/2014  . High-risk pregnancy 02/18/2014  . Supervision of pregnancy with other poor reproductive or obstetric history, unspecified trimester 02/18/2014  . Restless legs 01/16/2014  . Restless leg 01/16/2014  . Chronic interstitial cystitis 10/23/2013  . Menorrhagia 07/18/2013  . Excessive and frequent menstruation 07/18/2013  . h/o Opiate addiction 03/11/2012    Class: Acute  . History of migraine headaches 03/10/2012    Class: Acute  . Depression with anxiety 03/10/2012    Class: Chronic  . 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  Panic disorder without agoraphobia 03/10/2012  . H/O disease 03/10/2012  . Dysthymia 03/10/2012  . Pelvic congestion syndrome 10/13/2011  . Coitalgia 10/13/2011  . Chronic migraine without aura 10/13/2011  . Unspecified dyspareunia 10/13/2011  . IBS (irritable bowel syndrome) 08/25/2011  . Diarrhea 05/27/2011  . OBESITY, UNSPECIFIED 09/17/2010  . Transaminitis 09/17/2010  . Anemia, iron deficiency 07/23/2010    Past Surgical History:  Procedure Laterality Date  . BIOPSY  04/10/2015   Procedure: BIOPSY;  Surgeon: Daneil Dolin, MD;  Location: [facility];  Service: Endoscopy;;  . cath self every nite     for sodium bicarb injection (discontinued 2013)  . CHOLECYSTECTOMY  2005   biliary dyskinesia  . COLONOSCOPY  JUN 2012 ABD PN/DIARRHEA WITH PROPOFOL   NL COLON  . DILATION AND CURETTAGE OF UTERUS    . DILITATION & CURRETTAGE/HYSTROSCOPY WITH NOVASURE ABLATION N/A 03/24/2017   Procedure: DILATATION & CURETTAGE/HYSTEROSCOPY WITH NOVASURE ENDOMETRIAL ABLATION;  Surgeon: Jonnie Kind, MD;  Location: [facility];  Service:  Gynecology;  Laterality: N/A;  . ESOPHAGEAL DILATION N/A 04/10/2015   Procedure: ESOPHAGEAL DILATION WITH 54FR MALONEY DILATOR;  Surgeon: Daneil Dolin, MD;  Location: [facility];  Service: Endoscopy;  Laterality: N/A;  . ESOPHAGOGASTRODUODENOSCOPY    . ESOPHAGOGASTRODUODENOSCOPY (EGD) WITH PROPOFOL N/A 04/10/2015   Procedure: ESOPHAGOGASTRODUODENOSCOPY (EGD) WITH PROPOFOL;  Surgeon: Daneil Dolin, MD;  Location: [facility];  Service: Endoscopy;  Laterality: N/A;  . ESOPHAGOGASTRODUODENOSCOPY (EGD) WITH PROPOFOL N/A 12/06/2016   Procedure: ESOPHAGOGASTRODUODENOSCOPY (EGD) WITH PROPOFOL;  Surgeon: Danie Binder, MD;  Location: [facility];  Service: Endoscopy;  Laterality: N/A;  . GAB  2007   in [place]-POUCH 5 CM  . GASTRIC BYPASS  06/2006  . HYSTEROSCOPY W/D&C N/A 09/12/2014   Procedure: DILATATION AND CURETTAGE /HYSTEROSCOPY;  Surgeon: Jonnie Kind, MD;  Location: [facility];   Service: Gynecology;  Laterality: N/A;  . LAPAROSCOPIC TUBAL LIGATION Bilateral 03/24/2017   Procedure: LAPAROSCOPIC TUBAL LIGATION ([place] Rings);  Surgeon: Jonnie Kind, MD;  Location: [facility];  Service: Gynecology;  Laterality: Bilateral;  . REPAIR VAGINAL CUFF N/A 07/30/2014   Procedure: REPAIR VAGINAL CUFF;  Surgeon: Mora Bellman, MD;  Location: [facility];  Service: Gynecology;  Laterality: N/A;  . SAVORY DILATION  06/20/2012   Dr. Barnie Alderman gastritis/Ulcer in the mid jejunum. Empiric dilation.   . SURAL NERVE BX Left 02/25/2016   Procedure: LEFT SURAL NERVE BIOPSY;  Surgeon: Jovita Gamma, MD;  Location: [place] [facility];  Service: Neurosurgery;  Laterality: Left;  Left sural nerve biopsy  . TONSILLECTOMY    . TONSILLECTOMY AND ADENOIDECTOMY    . UPPER GASTROINTESTINAL ENDOSCOPY  JULY 2011 NAUSEA-D125,V6, PH 25   Bx; GASTRITIS, POUCH-5 CM LONG  . WISDOM TOOTH EXTRACTION      OB History    Gravida Para Term Preterm AB Living   _0 SAB TAB Ectopic Multiple Live Births   1       2       Home Medications    Prior to Admission medications   Medication Sig Start Date End Date Taking? Authorizing Provider  amphetamine-dextroamphetamine (ADDERALL XR) 30 MG 24 hr capsule Take 1 capsule (30 mg total) by mouth every morning. 11/07/17   Cloria Spring, MD  carbamazepine (TEGRETOL) 200 MG tablet Take two tablets at bedtime 11/07/17   Cloria Spring, MD  clonazePAM (KLONOPIN) 0.5 MG tablet Take 1 tablet (0.5 mg total) by mouth 3 (three) times daily as needed for anxiety. 11/07/17 11/07/18  Cloria Spring, MD  DULoxetine (CYMBALTA) 60 MG capsule Take 2 capsules (120 mg total) by mouth at bedtime. 11/07/17   Cloria Spring, MD  fluconazole (DIFLUCAN) 150 MG tablet Take 1 tablet (150 mg total) by mouth daily. Patient not taking: Reported on 12/23/2017 12/17/17   Jorje Guild, NP  fluconazole (DIFLUCAN) 150 MG tablet TAKE 1 TABLET BY MOUTH ONCE. 01/05/18   Jonnie Kind, MD    HYDROcodone Bitartrate ER [facility] ER) 20 MG T24A Take 40 mg by mouth at bedtime.     [provider]  ketorolac (TORADOL) 10 MG tablet Take 1 tablet (10 mg total) by mouth every 6 (six) hours as needed. 12/29/17   Jonnie Kind, MD  metroNIDAZOLE (FLAGYL) 500 MG tablet Take 1 tablet (500 mg total) by mouth 2 (two) times daily. Patient not taking: Reported on 12/23/2017 12/17/17   Keitha Butte, CNM  oxyCODONE (ROXICODONE) 15 MG immediate release tablet Take  15 mg by mouth every 4 (four) hours as needed for pain.    [provider]  pantoprazole (PROTONIX) 40 MG tablet Take 1 tablet (40 mg total) by mouth daily. 11/10/17   Setzer, Rona Ravens, NP  Pediatric Multivitamins-Iron (FLINTSTONES PLUS IRON PO) Take 1 tablet by mouth 2 (two) times daily.     [provider]  phenazopyridine (PYRIDIUM) 200 MG tablet Take 1 tablet (200 mg total) by mouth 3 (three) times daily with meals. Take for 3 days 12/21/17   Jonnie Kind, MD  PROAIR HFA 108 [phone] Base) MCG/ACT inhaler INHALE 2 PUFFS INTO THE LUNGS EVERY 6 HOURS AS NEEDED. 06/14/17   Kathyrn Drown, MD  promethazine (PHENERGAN) 25 MG tablet Take 1 tablet (25 mg total) by mouth every 6 (six) hours as needed. 06/14/17   Theodis Blaze, MD  tiZANidine (ZANAFLEX) 4 MG tablet Take 1 tablet (4 mg total) by mouth every 6 (six) hours as needed for muscle spasms. 12/23/17   Jonnie Kind, MD  topiramate (TOPAMAX) 50 MG tablet TAKE 1 TABLET BY MOUTH EACH MORNING AND 2 TABLETS AT BEDTIME. 11/07/17   Cloria Spring, MD  valACYclovir (VALTREX) 500 MG tablet Take 1 tablet (500 mg total) by mouth daily. 12/23/17   Jonnie Kind, MD    Family History Family History  Problem Relation Age of Onset  . Hemochromatosis Maternal Grandmother   . Migraines Maternal Grandmother   . Cancer Maternal Grandmother   . Breast cancer Maternal Grandmother   . Hypertension Father   . Diabetes Father   . Coronary artery disease Father   . Migraines  Paternal Grandmother   . Breast cancer Paternal Grandmother   . Cancer Mother        breast  . Hemochromatosis Mother   . Breast cancer Mother   . Depression Mother   . Anxiety disorder Mother   . Coronary artery disease Paternal Grandfather   . Anxiety disorder Brother   . Bipolar disorder Brother   . Healthy Daughter   . Healthy Son     Social History Social History   Tobacco Use  . Smoking status: Current Every Day Smoker    Packs/day: 0.25    Years: 6.00    Pack years: 1.50    Types: E-cigarettes  . Smokeless tobacco: Never Used  Substance Use Topics  . Alcohol use: Yes    Alcohol/week: 0.0 oz    Comment: occasionally  . Drug use: Yes    Types: Marijuana    Comment: Previously opioid addiction went through rehab     Allergies   Gabapentin; Metoclopramide hcl; Tramadol; Nucynta [tapentadol]; Propofol; Trazodone and nefazodone; Zofran [ondansetron]; Butrans [buprenorphine]; Toradol [ketorolac tromethamine]; Latex; Lyrica [pregabalin]; and Tape   Review of Systems Review of Systems  All systems reviewed and negative, other than as noted in HPI.  Physical Exam Updated Vital Signs BP (!) 123/98 (BP Location: Right Arm)   Pulse 87   Temp (!) 97.4 F (36.3 C) (Oral)   Resp 18   Ht 5' 6" (1.676 m)   Wt 93 kg (205 lb)   SpO2 97%   BMI 33.09 kg/m   Physical Exam  Constitutional: She is oriented to person, place, and time. She appears well-developed and well-nourished. No distress.  HENT:  Head: Normocephalic and atraumatic.  Eyes: Conjunctivae are normal. Right eye exhibits no discharge. Left eye exhibits no discharge.  Neck: Neck supple.  Cardiovascular: Normal rate, regular rhythm and normal  heart sounds. Exam reveals no gallop and no friction rub.  No murmur heard. Pulmonary/Chest: Effort normal and breath sounds normal. No respiratory distress.  Abdominal: Soft. She exhibits no distension. There is tenderness.  Mild diffuse tenderness, perhaps somewhat  worse in epigastrium  Musculoskeletal: She exhibits no edema or tenderness.  Neurological: She is alert and oriented to person, place, and time. No cranial nerve deficit. She exhibits normal muscle tone. Coordination normal.  Skin: Skin is warm and dry.  Psychiatric:  Anxious. Hyperventilating.  Nursing note and vitals reviewed.    ED Treatments / Results  Labs (all labs ordered are listed, but only abnormal results are displayed) Labs Reviewed  COMPREHENSIVE METABOLIC PANEL - Abnormal; Notable for the following components:      Result Value   Potassium 3.0 (*)    CO2 18 (*)    Glucose, Bld 108 (*)    All other components within normal limits  URINALYSIS, ROUTINE W REFLEX MICROSCOPIC - Abnormal; Notable for the following components:   Color, Urine AMBER (*)    APPearance HAZY (*)    pH 9.0 (*)    Ketones, ur 20 (*)    Protein, ur 30 (*)    Leukocytes, UA SMALL (*)    Bacteria, UA RARE (*)    Squamous Epithelial / LPF 6-30 (*)    All other components within normal limits  LIPASE, BLOOD  CBC  I-STAT BETA HCG BLOOD, ED ([facility], [facility], [facility] ONLY)    EKG  EKG Interpretation  Date/Time:  Wednesday January 11 2018 13:56:50 EST Ventricular Rate:  63 PR Interval:    QRS Duration: 94 QT Interval:  435 QTC Calculation: 446 R Axis:   59 Text Interpretation:  Sinus rhythm Confirmed by Virgel Manifold [phone]) on 01/11/2018 2:27:21 PM       Radiology No results found.  Procedures Procedures (including critical care time)  Medications Ordered in ED Medications  potassium chloride SA (K-DUR,KLOR-CON) CR tablet 40 mEq (not administered)  magnesium oxide (MAG-OX) tablet 400 mg (not administered)  sodium chloride 0.9 % bolus 1,000 mL (1,000 mLs Intravenous New Bag/Given 01/11/18 1445)  LORazepam (ATIVAN) injection 0.5 mg (0.5 mg Intravenous Given 01/11/18 1446)     Initial Impression / Assessment and Plan / ED Course  I have reviewed the triage vital signs and the nursing  notes.  Pertinent labs & imaging results that were available during my care of the patient were reviewed by me and considered in my medical decision making (see chart for details).     32 year old female with multiple complaints.  She is hyperventilating and seems very anxious but then when sitting up in bed she slumps forward.  Aside from the tachypnea she is otherwise hemodynamically stable.  Afebrile.  Workup is been fairly unremarkable aside from some hypokalemia.  The numbness in her hands and mouth is likely from her hyperventilating.  She sounds clear on exam.  Chest x-ray is clear.  I did not question it further but she twice mentioned that her child "is coming home today." With her past history I strongly suspect their is a psychosomatic component. I doubt an acute emergent condition.   Final Clinical Impressions(s) / ED Diagnoses   Final diagnoses:  Numbness and tingling in both hands  Abdominal pain, unspecified abdominal location  Cystitis    ED Discharge Orders    None       Virgel Manifold, MD 01/12/18 [phone]

## 2018-01-11 NOTE — Discharge Instructions (Addendum)
Take the prescriptions as directed.  Call your regular medical doctor tomorrow to schedule a follow up appointment within the next 3 days.  Return to the Emergency Department immediately sooner if worsening.

## 2018-01-12 ENCOUNTER — Telehealth: Payer: Self-pay | Admitting: *Deleted

## 2018-01-12 NOTE — Telephone Encounter (Signed)
Patient called requesting a refill on Toradol for lower back pain. States she was seen in the ER last night and was diagnosed with an UTI and was given antibiotics. Informed patient that the lower back pain may be coming from the UTI and to take all the antibiotics and push fluids. Informed that Dr Glo Herring was out of the office today and since she was seen at her PCP yesterday for this,she could call them for prescription. Pt then asked if Dr Elonda Husky would refill and informed patient he would not since he does not see her. Patient then stated she had a headaches as well. Advised to take Tylenol. Verbalized understanding.

## 2018-01-13 ENCOUNTER — Telehealth: Payer: Self-pay | Admitting: *Deleted

## 2018-01-13 ENCOUNTER — Other Ambulatory Visit: Payer: Self-pay | Admitting: *Deleted

## 2018-01-13 DIAGNOSIS — R5383 Other fatigue: Secondary | ICD-10-CM

## 2018-01-13 MED ORDER — PHENAZOPYRIDINE HCL 200 MG PO TABS: 200.0000 mg | ORAL_TABLET | Freq: Three times a day (TID) | ORAL | 0 refills | Status: DC

## 2018-01-13 NOTE — Telephone Encounter (Addendum)
Patient called stating she was following up on request for prescription refill for flexeril, pyridium and Diflucan. States she called Dr Lance Sell office but they would not prescribe to her because she does not see them for these meds. Informed patient I would discuss with Dr Glo Herring after his morning patients and would get back to her. Verbalized understanding

## 2018-01-13 NOTE — Telephone Encounter (Signed)
Pt calls requesting pyridium and diflucan due to hx recurrent post-antibiotic yeast infections. Chart reviewed , pt requested Flexeril which will NOT be Rx'd due to potential to add to her sedation, as does the Ativan received from psychiatrist...  The probability of psychiatric etiology of her behavior is significant. Pt has excessive tendency to polypharmacy.  pt notified by RN of Rx's

## 2018-01-13 NOTE — Telephone Encounter (Signed)
Called patient 3 times and was hung up. VM full.

## 2018-01-13 NOTE — Addendum Note (Signed)
Addended by: Jonnie Kind on: 01/13/2018 12:55 PM   Modules accepted: Orders

## 2018-01-16 ENCOUNTER — Other Ambulatory Visit: Payer: Self-pay | Admitting: Family Medicine

## 2018-01-16 ENCOUNTER — Telehealth: Payer: Self-pay | Admitting: Family Medicine

## 2018-01-16 ENCOUNTER — Other Ambulatory Visit: Payer: Self-pay | Admitting: *Deleted

## 2018-01-16 MED ORDER — OSELTAMIVIR PHOSPHATE 75 MG PO CAPS: 75.0000 mg | ORAL_CAPSULE | Freq: Two times a day (BID) | ORAL | 0 refills | Status: DC

## 2018-01-16 MED ORDER — CEFDINIR 300 MG PO CAPS: 300.0000 mg | ORAL_CAPSULE | Freq: Two times a day (BID) | ORAL | 0 refills | Status: DC

## 2018-01-16 MED ORDER — TIZANIDINE HCL 4 MG PO TABS: 4.0000 mg | ORAL_TABLET | Freq: Two times a day (BID) | ORAL | 0 refills | Status: DC | PRN

## 2018-01-16 NOTE — Telephone Encounter (Signed)
Patient said that she is having continued issues from the UTI that she was seen for at the ER last week.  She said she is having severe back pain.  She is requesting something called in to knock the UTI out.  Also, she is requesting Rx for tizanidine for back spasms and all over muscle cramps from the UTI.  [employer]

## 2018-01-16 NOTE — Telephone Encounter (Signed)
Prescriptions sent electronically to pharmacy. Patient notified. °

## 2018-01-16 NOTE — Telephone Encounter (Signed)
Patient currently on keflex thru ER

## 2018-01-16 NOTE — Telephone Encounter (Signed)
Tizanidine 4 mg, 1 twice daily as needed muscle cramps, home use only, #14.  Recommend stopping Keflex.  Try Omnicef 300 mg 1 twice daily for 7 days-follow-up office visit if ongoing troubles

## 2018-01-16 NOTE — Telephone Encounter (Signed)
Prescription for Tamiflu sent in per patient request she stated her daughter has flulike symptoms and she is beginning to have flulike illness.  Tamiflu 75 mg twice daily for 5 days sent into [facility] follow-up if problems

## 2018-01-30 ENCOUNTER — Telehealth: Payer: Self-pay | Admitting: Family Medicine

## 2018-01-30 NOTE — Telephone Encounter (Signed)
Muscle relaxers and pain medications can interact together to increase risk of accidental overdose and the risk of significant neuro side effects.  I would recommend the patient speak with her pain medicine specialist if they are okay with her having a muscle relaxer they can prescribe it.

## 2018-01-30 NOTE — Telephone Encounter (Signed)
Has question about a Medication, wouldn't tell me anything other than she wanted to talk to a nurse.

## 2018-01-30 NOTE — Telephone Encounter (Signed)
Patient states she has had a migraine all weekend. States she would like if you could to please call her in Tizanidine 4 mg to knock out the headache. She states her pain management Dr has taken her off of the oxycodone 15 and she is currently on Hydrocodone 10/325 two po daily and hysigular 40 mg QHS. She states the Pain management Dr.is now sending her for a MRI of the C4-5.

## 2018-01-31 ENCOUNTER — Other Ambulatory Visit (HOSPITAL_COMMUNITY): Payer: Self-pay | Admitting: Psychiatry

## 2018-01-31 ENCOUNTER — Telehealth (HOSPITAL_COMMUNITY): Payer: Self-pay | Admitting: *Deleted

## 2018-01-31 ENCOUNTER — Telehealth: Payer: Self-pay | Admitting: Family Medicine

## 2018-01-31 MED ORDER — AMPHETAMINE-DEXTROAMPHET ER 30 MG PO CP24: 30.0000 mg | ORAL_CAPSULE | ORAL | 0 refills | Status: DC

## 2018-01-31 NOTE — Telephone Encounter (Signed)
Patient wants to talk with Nurse wouldn't leave any details.

## 2018-01-31 NOTE — Telephone Encounter (Signed)
Patient Aware -

## 2018-01-31 NOTE — Telephone Encounter (Signed)
sent 

## 2018-01-31 NOTE — Telephone Encounter (Signed)
Patient called back states she is in the middle of her finals and wants to know if you could give her something else? Toradol,Tramadol. I asked if she contacted the pain management Dr. As per the previous note.She says he  (Dr.Kincaid) told her to call pcp. She states Dr. Lunette Stands is located in [facility] and she is not going back that way for two weeks or more. I spoke with [place] receptionist at [facility] ( Dr. Ed Blalock office) they state they do not see a call in regards to a headache or anything stating for the pt to call our office. The last dealing they had with her was 01/27/2018 pt picked up Hydrocodone 10/325 # 60,and Oxycodone 15 mg one TID # 90 on 01/24/2018. They transferred me back to the nurses station and I left a message there to confirm the information that Brittnay had told me.Pt did not not want me to send a message back to you,but to speak directly with you. Please advise.

## 2018-01-31 NOTE — Telephone Encounter (Signed)
Dr Harrington Challenger  Patient  Came today requesting refill with only 2 tablets left. Refill on Adderall. Next appointment 02/06/18

## 2018-01-31 NOTE — Telephone Encounter (Signed)
Sarah, MA for Dr. Lunette Stands states she does not see any where that the pt has called to speak to anyone since 01/27/2018 when she picked up medication. She will call Tariana to discuss.

## 2018-01-31 NOTE — Telephone Encounter (Signed)
I recommend that we leave any prescription for muscle relaxers to the discretion of the pain management doctor because of the potential for interaction with pain medicines

## 2018-02-05 ENCOUNTER — Other Ambulatory Visit (HOSPITAL_COMMUNITY): Payer: Self-pay | Admitting: Psychiatry

## 2018-02-06 ENCOUNTER — Ambulatory Visit (HOSPITAL_COMMUNITY): Payer: Medicaid Other | Admitting: Psychiatry

## 2018-02-06 ENCOUNTER — Encounter (HOSPITAL_COMMUNITY): Payer: Self-pay | Admitting: Psychiatry

## 2018-02-06 VITALS — BP 128/89 | HR 88 | Ht 66.0 in | Wt 192.0 lb

## 2018-02-06 DIAGNOSIS — F17210 Nicotine dependence, cigarettes, uncomplicated: Secondary | ICD-10-CM | POA: Diagnosis not present

## 2018-02-06 DIAGNOSIS — R51 Headache: Secondary | ICD-10-CM

## 2018-02-06 DIAGNOSIS — Z560 Unemployment, unspecified: Secondary | ICD-10-CM

## 2018-02-06 DIAGNOSIS — M542 Cervicalgia: Secondary | ICD-10-CM

## 2018-02-06 DIAGNOSIS — Z818 Family history of other mental and behavioral disorders: Secondary | ICD-10-CM | POA: Diagnosis not present

## 2018-02-06 DIAGNOSIS — F1290 Cannabis use, unspecified, uncomplicated: Secondary | ICD-10-CM | POA: Diagnosis not present

## 2018-02-06 DIAGNOSIS — F332 Major depressive disorder, recurrent severe without psychotic features: Secondary | ICD-10-CM | POA: Diagnosis not present

## 2018-02-06 MED ORDER — CARBAMAZEPINE 200 MG PO TABS: ORAL_TABLET | ORAL | 2 refills | Status: DC

## 2018-02-06 MED ORDER — AMPHETAMINE-DEXTROAMPHET ER 30 MG PO CP24: 30.0000 mg | ORAL_CAPSULE | ORAL | 0 refills | Status: DC

## 2018-02-06 MED ORDER — CARIPRAZINE HCL 1.5 MG PO CAPS: 1.5000 mg | ORAL_CAPSULE | Freq: Every day | ORAL | 2 refills | Status: DC

## 2018-02-06 MED ORDER — CLONAZEPAM 0.5 MG PO TABS: 0.5000 mg | ORAL_TABLET | Freq: Three times a day (TID) | ORAL | 2 refills | Status: DC | PRN

## 2018-02-06 MED ORDER — DULOXETINE HCL 60 MG PO CPEP: 120.0000 mg | ORAL_CAPSULE | Freq: Every day | ORAL | 2 refills | Status: DC
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
## 2018-02-07 IMAGING — CT CT RENAL STONE PROTOCOL
2 of 4 series · 16 of 46 positions shown, 18 images · non-contrast
Comparison: CT abdomen pelvis 12/03/2016, 06/15/2016

CLINICAL DATA: Lower mid back pain radiating to the lower quadrants

EXAM:
CT ABDOMEN AND PELVIS WITHOUT CONTRAST
TECHNIQUE: Multidetector CT imaging of the abdomen and pelvis was performed
following the standard protocol without IV contrast.

[Series 2: axial st · axial · 0.79mm/px · z∈[+1078,+1558]mm · 13 of 106 slices shown, 15 images]
[im 5/106  soft-tissue]
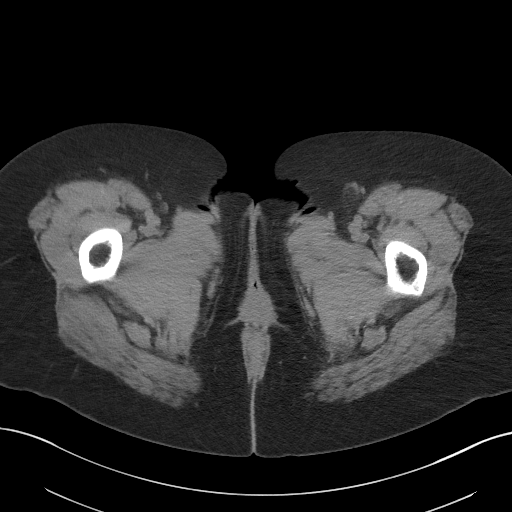
[im 5/106  bone]
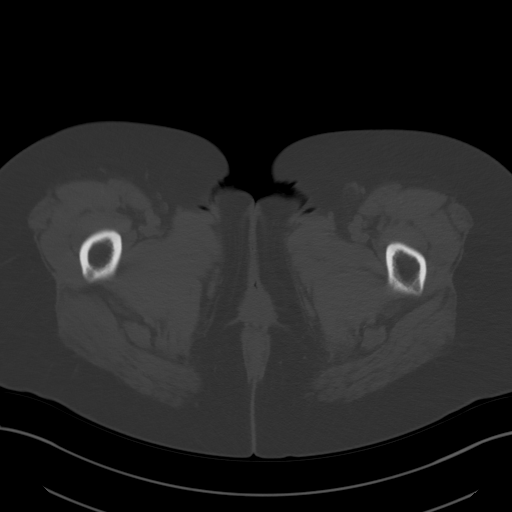
[im 13/106  soft-tissue]
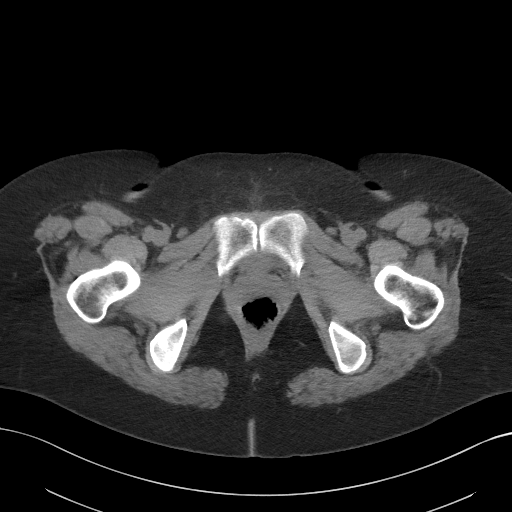
[im 22/106  soft-tissue]
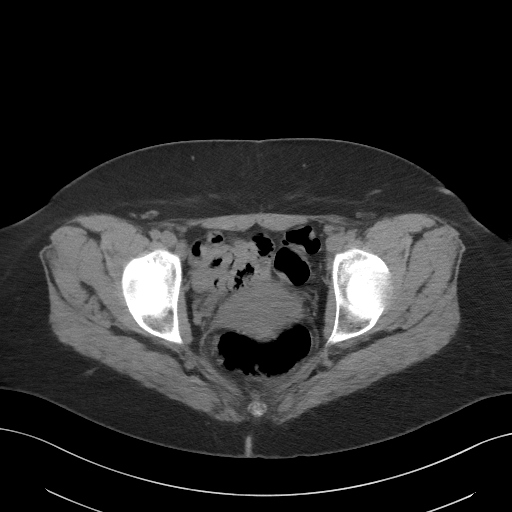
[im 30/106  soft-tissue]
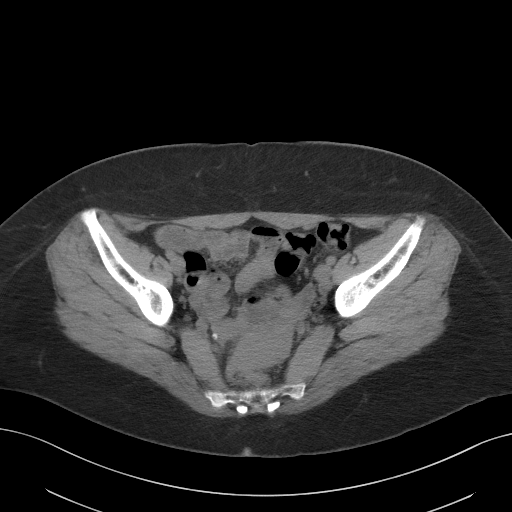
[im 38/106  soft-tissue]
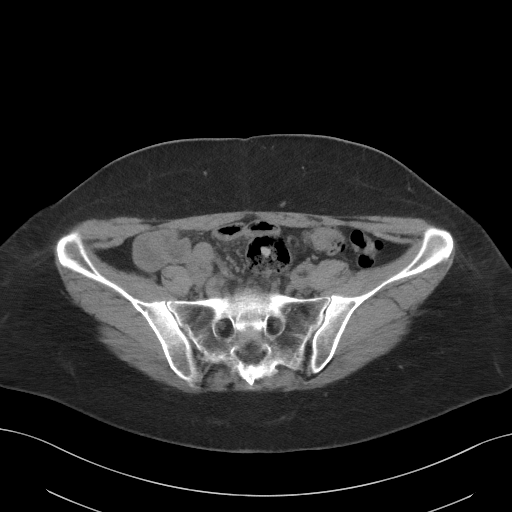
[im 47/106  soft-tissue]
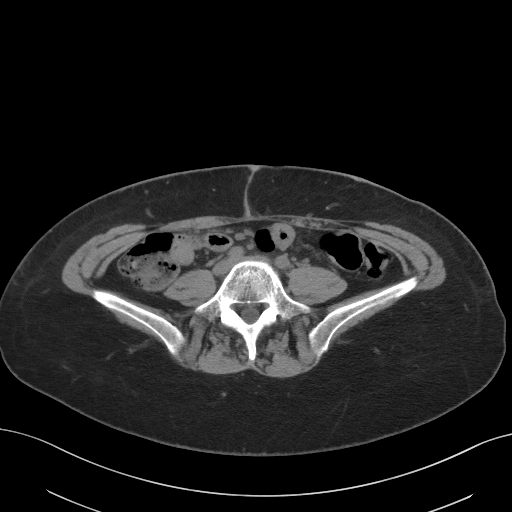
[im 55/106  soft-tissue]
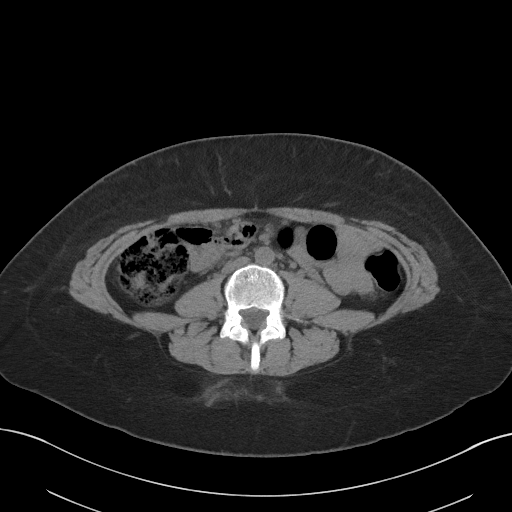
[im 59/106  soft-tissue]
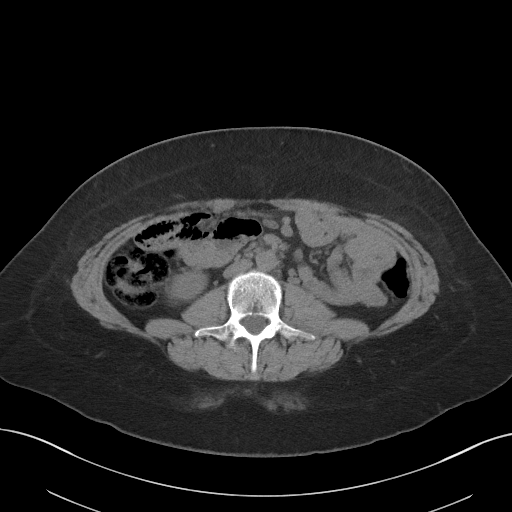
[im 68/106  soft-tissue]
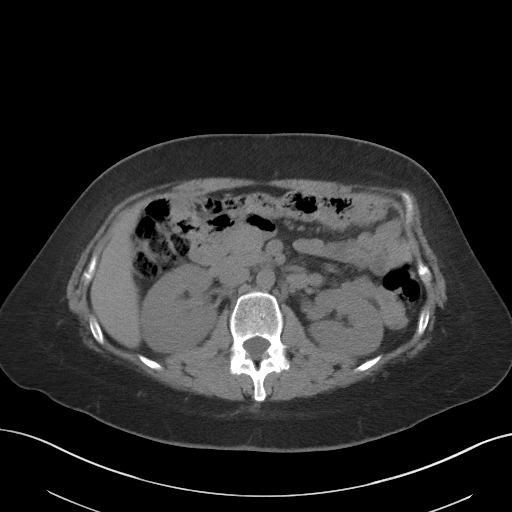
[im 68/106  bone]
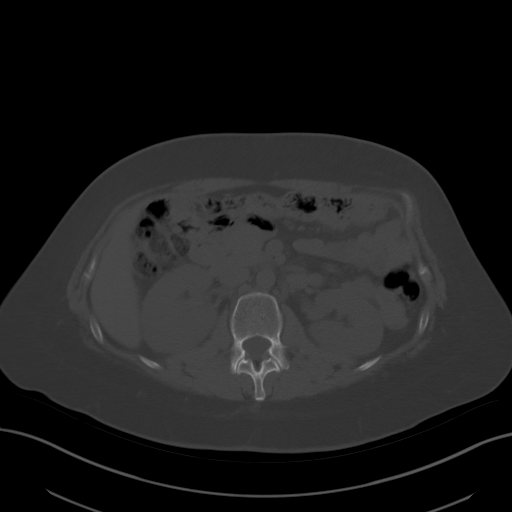
[im 76/106  soft-tissue]
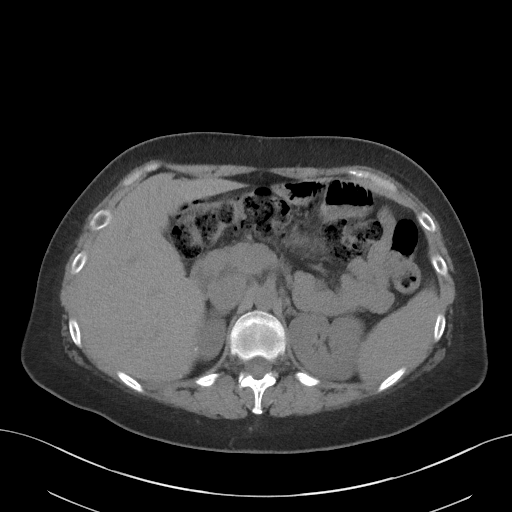
[im 85/106  soft-tissue]
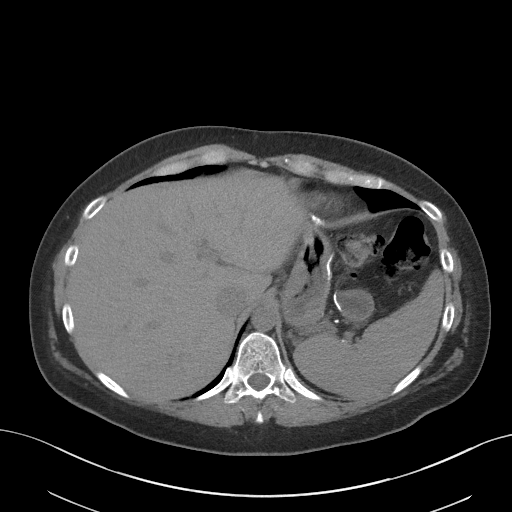
[im 93/106  soft-tissue]
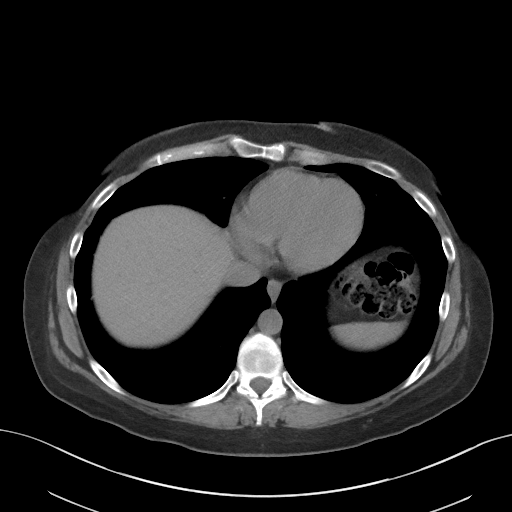
[im 101/106  soft-tissue]
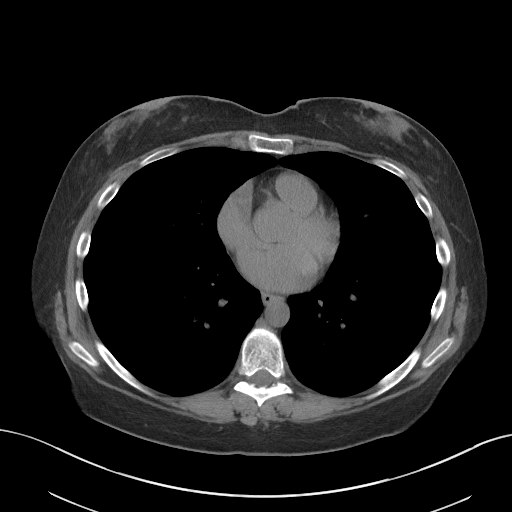

[Series 5: coronal st · coronal · 0.89mm/px · 3 of 95 slices shown]
[im 32/95  soft-tissue]
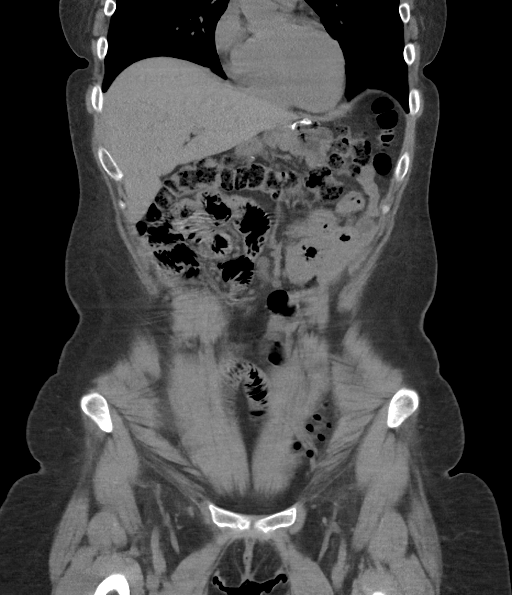
[im 42/95  soft-tissue]
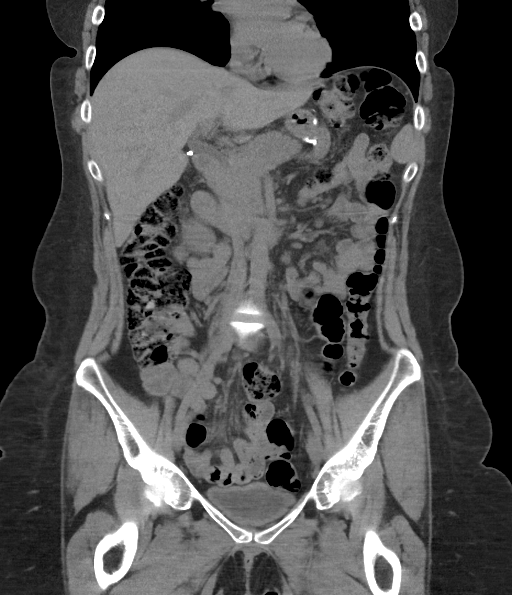
[im 53/95  soft-tissue]
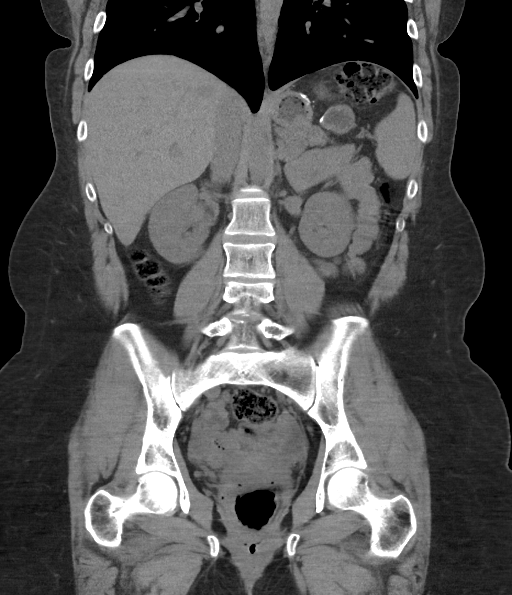

[16 of 46 positions shown; findings below may reference images not displayed]

FINDINGS: Lower chest: Lung bases are clear.  Heart size is normal.

Hepatobiliary: No focal liver abnormality is seen. Status post
cholecystectomy. No biliary dilatation.

Pancreas: Unremarkable. No pancreatic ductal dilatation or
surrounding inflammatory changes.

Spleen: Normal in size without focal abnormality.

Adrenals/Urinary Tract: Adrenal glands are unremarkable. Kidneys are
normal, without renal calculi, focal lesion, or hydronephrosis.
Bladder is unremarkable.

Stomach/Bowel: Status post gastric bypass. Negative for bowel
obstruction or wall thickening. Normal appendix

Vascular/Lymphatic: Nonaneurysmal aorta. Slightly enlarged right
greater than left inguinal lymph nodes measuring up to 12 mm on the
right and 1 cm on the left

Reproductive: Uterus and bilateral adnexa are unremarkable.

Other: Negative for free air or free fluid.

Musculoskeletal: Degenerative changes. No acute or suspicious
abnormality.
IMPRESSION: 1. Negative for nephrolithiasis, hydronephrosis or ureteral stone
2. No CT evidence for acute intra-abdominal or pelvic abnormality.
3. Slightly enlarged bilateral inguinal lymph nodes, nonspecific

## 2018-02-21 ENCOUNTER — Other Ambulatory Visit (HOSPITAL_COMMUNITY): Payer: Self-pay | Admitting: Psychiatry

## 2018-03-06 ENCOUNTER — Encounter (INDEPENDENT_AMBULATORY_CARE_PROVIDER_SITE_OTHER): Payer: Self-pay | Admitting: Internal Medicine

## 2018-03-06 ENCOUNTER — Other Ambulatory Visit: Payer: Self-pay

## 2018-03-06 ENCOUNTER — Ambulatory Visit (INDEPENDENT_AMBULATORY_CARE_PROVIDER_SITE_OTHER): Payer: Medicaid Other | Admitting: Internal Medicine

## 2018-03-06 ENCOUNTER — Encounter (HOSPITAL_COMMUNITY): Payer: Self-pay | Admitting: Emergency Medicine

## 2018-03-06 ENCOUNTER — Emergency Department (HOSPITAL_COMMUNITY)
Admission: EM | Admit: 2018-03-06 | Discharge: 2018-03-06 | Disposition: A | Discharge status: Home / Self Care | Payer: Medicaid Other | Attending: Emergency Medicine | Admitting: Emergency Medicine

## 2018-03-06 ENCOUNTER — Ambulatory Visit (HOSPITAL_COMMUNITY)
Admission: RE | Admit: 2018-03-06 | Discharge: 2018-03-06 | Disposition: A | Discharge status: Home / Self Care | Payer: Medicaid Other | Source: Ambulatory Visit | Attending: Internal Medicine | Admitting: Internal Medicine

## 2018-03-06 VITALS — BP 120/90 | HR 76 | Temp 98.2°F | Ht 66.0 in | Wt 185.1 lb

## 2018-03-06 DIAGNOSIS — K5900 Constipation, unspecified: Secondary | ICD-10-CM | POA: Insufficient documentation

## 2018-03-06 DIAGNOSIS — Z5321 Procedure and treatment not carried out due to patient leaving prior to being seen by health care provider: Secondary | ICD-10-CM | POA: Diagnosis not present

## 2018-03-06 NOTE — Progress Notes (Signed)
Subjective:    Patient ID: Penny [name], female    DOB: March 06, 1986, 32 y.o.   MRN: [id number]  HPI Here today for f/u of her chronic constipation. She was last seen by me for dysphagia. DG esophagram was ordered but was cancelled by patient. Her last CBC was normal on 01/11/2018.  Liver enzymes normal 01/11/2017.She has not had a BM in 14 days. Came by office last week and was given samples of Trulance.  The Trulance did not help. She is taking laxatives, Mag Citrate and stool softener. She says she cannot eat or drink anything. She has tried an enema several times.  She says she cannot eat or drink anything due to the constipation. She appears very uncomfortable and teary today.   Takes chronic pain meds for neck pain.    Gastric bypass in 2007.  EGD in January o this year.  of last year revealed.  Impression: - Normal esophagus. - POSSIBLE TORSION OF GASTRIC REMNANT - Normal examined jejunum.      Review of Systems Past Medical History:  Diagnosis Date  . ADD (attention deficit disorder)   . Anemia 2011   2o to GASTRIC BYPASS  . Anginal pain ([place])   . Anxiety   . Blood transfusion without reported diagnosis   . Chronic daily headache   . Depression   . Depression   . Dysmenorrhea 09/18/2013  . Dysrhythmia   . Elevated liver enzymes JUL 2011ALK PHOS 111-127 AST  143-267 ALT  213-321T BILI 0.6  ALB  3.7-4.06 Jun 2011 ALK PHOS 118 AST 24 ALT 42 T BILI 0.4 ALB 3.9  . Encounter for drug rehabilitation    at behavioral health for opioid addiction about 4 years ago  . Family history of adverse reaction to anesthesia    'dad had to be kept on pump for breathing for morphine'  . Fatty liver   . Fibroid 01/18/2017  . Fibromyalgia   . Gastric bypass status for obesity   . Gastritis JULY 2011  . Heart murmur   . 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 History of Holter monitoring   . Hx of  opioid abuse    for about 4 years, about 4 years ago  . Interstitial cystitis   . Iron deficiency anemia 07/23/2010  . Irritable bowel syndrome 2012 DIARRHEA   JUN 2012 TTG IgA 14.9  . IUD FEB 2010  . Lupus ([place])   . Menorrhagia 07/18/2013  . Migraines   . Obesity (BMI 30-39.9) 2011 228 LBS BMI 36.8  . Ovarian cyst   . Patient desires pregnancy 09/18/2013  . Polyneuropathy   . PONV (postoperative nausea and vomiting) seizure post-operatively  . Potassium (K) deficiency   . Pregnant 12/25/2013  . Psychiatric pseudoseizure   . RLQ abdominal pain 07/18/2013  . Sciatica of left side 11/14/2014  . 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 Stress 09/18/2013    Past Surgical History:  Procedure Laterality Date  . BIOPSY  04/10/2015   Procedure: BIOPSY;  Surgeon: Daneil Dolin, MD;  Location: [facility];  Service: Endoscopy;;  . cath self every nite     for sodium bicarb injection (discontinued 2013)  . CHOLECYSTECTOMY  2005   biliary dyskinesia  . COLONOSCOPY  JUN 2012 ABD PN/DIARRHEA WITH PROPOFOL   NL COLON  . DILATION AND CURETTAGE OF UTERUS    . DILITATION & CURRETTAGE/HYSTROSCOPY WITH NOVASURE ABLATION N/A 03/24/2017   Procedure: DILATATION &  CURETTAGE/HYSTEROSCOPY WITH NOVASURE ENDOMETRIAL ABLATION;  Surgeon: Jonnie Kind, MD;  Location: [facility];  Service: Gynecology;  Laterality: N/A;  . ESOPHAGEAL DILATION N/A 04/10/2015   Procedure: ESOPHAGEAL DILATION WITH 54FR MALONEY DILATOR;  Surgeon: Daneil Dolin, MD;  Location: [facility];  Service: Endoscopy;  Laterality: N/A;  . ESOPHAGOGASTRODUODENOSCOPY    . ESOPHAGOGASTRODUODENOSCOPY (EGD) WITH PROPOFOL N/A 04/10/2015   Procedure: ESOPHAGOGASTRODUODENOSCOPY (EGD) WITH PROPOFOL;  Surgeon: Daneil Dolin, MD;  Location: [facility];  Service: Endoscopy;  Laterality: N/A;  . ESOPHAGOGASTRODUODENOSCOPY (EGD) WITH PROPOFOL N/A 12/06/2016   Procedure: ESOPHAGOGASTRODUODENOSCOPY (EGD) WITH PROPOFOL;  Surgeon: Danie Binder, MD;  Location[facility];  Service: Endoscopy;  Laterality: N/A;  . GAB  2007   in [place]-POUCH 5 CM  . GASTRIC BYPASS  06/2006  . HYSTEROSCOPY W/D&C N/A 09/12/2014   Procedure: DILATATION AND CURETTAGE /HYSTEROSCOPY;  Surgeon: Jonnie Kind, MD;  Location: [facility];  Service: Gynecology;  Laterality: N/A;  . LAPAROSCOPIC TUBAL LIGATION Bilateral 03/24/2017   Procedure: LAPAROSCOPIC TUBAL LIGATION ([place] Rings);  Surgeon: Jonnie Kind, MD;  Location: [facility];  Service: Gynecology;  Laterality: Bilateral;  . REPAIR VAGINAL CUFF N/A 07/30/2014   Procedure: REPAIR VAGINAL CUFF;  Surgeon: Mora Bellman, MD;  Location: [facility];  Service: Gynecology;  Laterality: N/A;  . SAVORY DILATION  06/20/2012   Dr. Barnie Alderman gastritis/Ulcer in the mid jejunum. Empiric dilation.   . SURAL NERVE BX Left 02/25/2016   Procedure: LEFT SURAL NERVE BIOPSY;  Surgeon: Jovita Gamma, MD;  Location: [facility];  Service: Neurosurgery;  Laterality: Left;  Left sural nerve biopsy  . TONSILLECTOMY    . TONSILLECTOMY AND ADENOIDECTOMY    . UPPER GASTROINTESTINAL ENDOSCOPY  JULY 2011 NAUSEA-D125,V6, PH 25   Bx; GASTRITIS, POUCH-5 CM LONG  . WISDOM TOOTH EXTRACTION      Allergies  Allergen Reactions  . Gabapentin Other (See Comments)    Pt states that she was unresponsive after taking this medication, but her vitals remained stable.    . Metoclopramide Hcl Anxiety and Other (See Comments)    Pt states that she felt like she was trapped in a box, and could not get out.  Pt also states that she had temporary loss of movement, weakness, and tingling.    . Tramadol Other (See Comments)    Seizures  . Nucynta [Tapentadol]     vomiting  . Propofol Nausea And Vomiting  . Trazodone And Nefazodone Other (See Comments)    Makes pt "like a zombie"  . Zofran [Ondansetron] Other (See Comments)    Migraines   . Butrans [Buprenorphine]     Patch caused rash, didn't help with pain  . Toradol [Ketorolac Tromethamine] Other (See Comments)      anxiety  . Latex Rash  . Lyrica [Pregabalin]     suicidal  . Tape Itching and Rash    Please use paper tape    Current Outpatient Medications on File Prior to Visit  Medication Sig Dispense Refill  . amphetamine-dextroamphetamine (ADDERALL XR) 30 MG 24 hr capsule Take 1 capsule (30 mg total) by mouth every morning. 30 capsule 0  . carbamazepine (TEGRETOL) 200 MG tablet Take two tablets at bedtime 60 tablet 2  . cariprazine (VRAYLAR) capsule Take 1 capsule (1.5 mg total) by mouth daily. 30 capsule 2  . clonazePAM (KLONOPIN) 0.5 MG tablet Take 1 tablet (0.5 mg total) by mouth 3 (three) times daily as needed for anxiety. 90 tablet 2  .  DULoxetine (CYMBALTA) 60 MG capsule Take 2 capsules (120 mg total) by mouth at bedtime. 60 capsule 2  . HYDROcodone Bitartrate ER (HYSINGLA ER) 20 MG T24A Take 30 mg by mouth at bedtime.     . pantoprazole (PROTONIX) 40 MG tablet Take 1 tablet (40 mg total) by mouth daily. 90 tablet 3  . Pediatric Multivitamins-Iron (FLINTSTONES PLUS IRON PO) Take 1 tablet by mouth 2 (two) times daily.     . phenazopyridine (PYRIDIUM) 200 MG tablet Take 1 tablet (200 mg total) by mouth 3 (three) times daily with meals. Take for 3 days 10 tablet 0  . PROAIR HFA 108 (90 Base) MCG/ACT inhaler INHALE 2 PUFFS INTO THE LUNGS EVERY 6 HOURS AS NEEDED. 8.5 g 0  . promethazine (PHENERGAN) 25 MG tablet Take 1 tablet (25 mg total) by mouth every 6 (six) hours as needed. 30 tablet 0  . topiramate (TOPAMAX) 50 MG tablet TAKE 1 TABLET BY MOUTH EACH MORNING AND 2 TABLETS AT BEDTIME. 90 tablet 0  . valACYclovir (VALTREX) 500 MG tablet Take 1 tablet (500 mg total) by mouth daily. 90 tablet 4   No current facility-administered medications on file prior to visit.         Objective:   Physical Exam Blood pressure 120/90, pulse 76, temperature 98.2 F (36.8 C), height '5\' 6"'$  (1.676 m), weight 185 lb 1.6 oz (84 kg). Alert and oriented. Skin warm and dry. Oral mucosa is moist.   . Sclera  anicteric, conjunctivae is pink. Thyroid not enlarged. No cervical lymphadenopathy. Lungs clear. Heart regular rate and rhythm.  Abdomen is soft. Bowel sounds are positive. No hepatomegaly. No abdominal masses felt. No tenderness.  No edema to lower extremities.  .         Assessment & Plan:  Constipation. KUB. Go to Drug store and get an enema and hold for as long as you can.  Will call when I get results of KUB

## 2018-03-06 NOTE — Patient Instructions (Signed)
KUB.  Fleets enema OTC

## 2018-03-06 NOTE — ED Notes (Signed)
Updated pt on wait time. Pt verbalized understanding.  

## 2018-03-06 NOTE — ED Triage Notes (Signed)
Pt states has not had BM in 14 days. Has tried enemas, mag citrate. Just seen by [facility] and states NP did rectal exam and told her there was nothing low. nad

## 2018-03-15 ENCOUNTER — Other Ambulatory Visit: Payer: Self-pay | Admitting: Adult Health

## 2018-03-15 ENCOUNTER — Telehealth (INDEPENDENT_AMBULATORY_CARE_PROVIDER_SITE_OTHER): Payer: Self-pay | Admitting: Internal Medicine

## 2018-03-15 NOTE — Telephone Encounter (Signed)
Patient left a message for you to call her at [phone]

## 2018-03-15 NOTE — Telephone Encounter (Signed)
Return call to the patient . She states that she has seen Terri recently, and she had tried Trulance prior to that visit with no result. She states that she had to drink hot prune juice , magnesium citratre , and take multiple laxatives to have a bowel movement. She reports that the pain is severe in her upper abdomen. Continues not to have regular BM's. Cannot eat solid foods as it makes her sick to the stomach , and she says that she is drinking protein shakes.   To review with Dr.Rehman. Patient's call back number is [phone].

## 2018-03-17 NOTE — Telephone Encounter (Signed)
Dr.Rehman  , please advise as to what the patient should try or your recommendation.

## 2018-04-03 ENCOUNTER — Other Ambulatory Visit (HOSPITAL_COMMUNITY): Payer: Self-pay | Admitting: Psychiatry

## 2018-04-10 ENCOUNTER — Ambulatory Visit (HOSPITAL_COMMUNITY): Payer: Medicaid Other | Admitting: Psychiatry

## 2018-04-16 ENCOUNTER — Other Ambulatory Visit (INDEPENDENT_AMBULATORY_CARE_PROVIDER_SITE_OTHER): Payer: Self-pay | Admitting: Internal Medicine

## 2018-04-16 MED ORDER — POLYETHYLENE GLYCOL 3350 17 GM/SCOOP PO POWD: 17.0000 g | Freq: Every day | ORAL | 11 refills | Status: DC

## 2018-04-16 NOTE — Telephone Encounter (Signed)
Patient's call returned. She complains of swallowing difficulty.  She was scheduled for esophagogram but did not go for the test. Will reschedule it. Continues to complain of upper abdominal pain and constipation and having to take laxative tablets every day. She is on [employer] brand OTC laxative 3 to 4 tablets every day.  She is eating fruits and salads.  She may have 1 or 2 bowel movements a week. Patient advised to cut back on laxative tablets to no more than 2 a day and go back to MiraLAX 17 g p.o. daily.

## 2018-04-17 ENCOUNTER — Telehealth (HOSPITAL_COMMUNITY): Payer: Self-pay | Admitting: *Deleted

## 2018-04-17 ENCOUNTER — Other Ambulatory Visit (HOSPITAL_COMMUNITY): Payer: Self-pay | Admitting: Psychiatry

## 2018-04-17 MED ORDER — AMPHETAMINE-DEXTROAMPHET ER 30 MG PO CP24: 30.0000 mg | ORAL_CAPSULE | ORAL | 0 refills | Status: DC

## 2018-04-17 NOTE — Telephone Encounter (Signed)
BPE sch'd 04/21/18 at 830 am (815), npo 3 hrs, left detailed message for patient

## 2018-04-17 NOTE — Telephone Encounter (Signed)
Dr Harrington Challenger Patient called stating due to death in family she had to reschedule last weeks appointment until 04/24/18. Requesting refill on Adderall

## 2018-04-17 NOTE — Telephone Encounter (Signed)
sent 

## 2018-04-21 ENCOUNTER — Ambulatory Visit (HOSPITAL_COMMUNITY): Payer: Medicaid Other

## 2018-04-24 ENCOUNTER — Ambulatory Visit (HOSPITAL_COMMUNITY): Payer: Self-pay | Admitting: Psychiatry

## 2018-04-26 IMAGING — DX DG ABDOMEN 1V
1 series · 1 of 1 positions shown · non-contrast
Comparison: CT scan of December 18, 2017.

CLINICAL DATA: Constipation.

EXAM:
ABDOMEN - 1 VIEW

[abdomen kub]
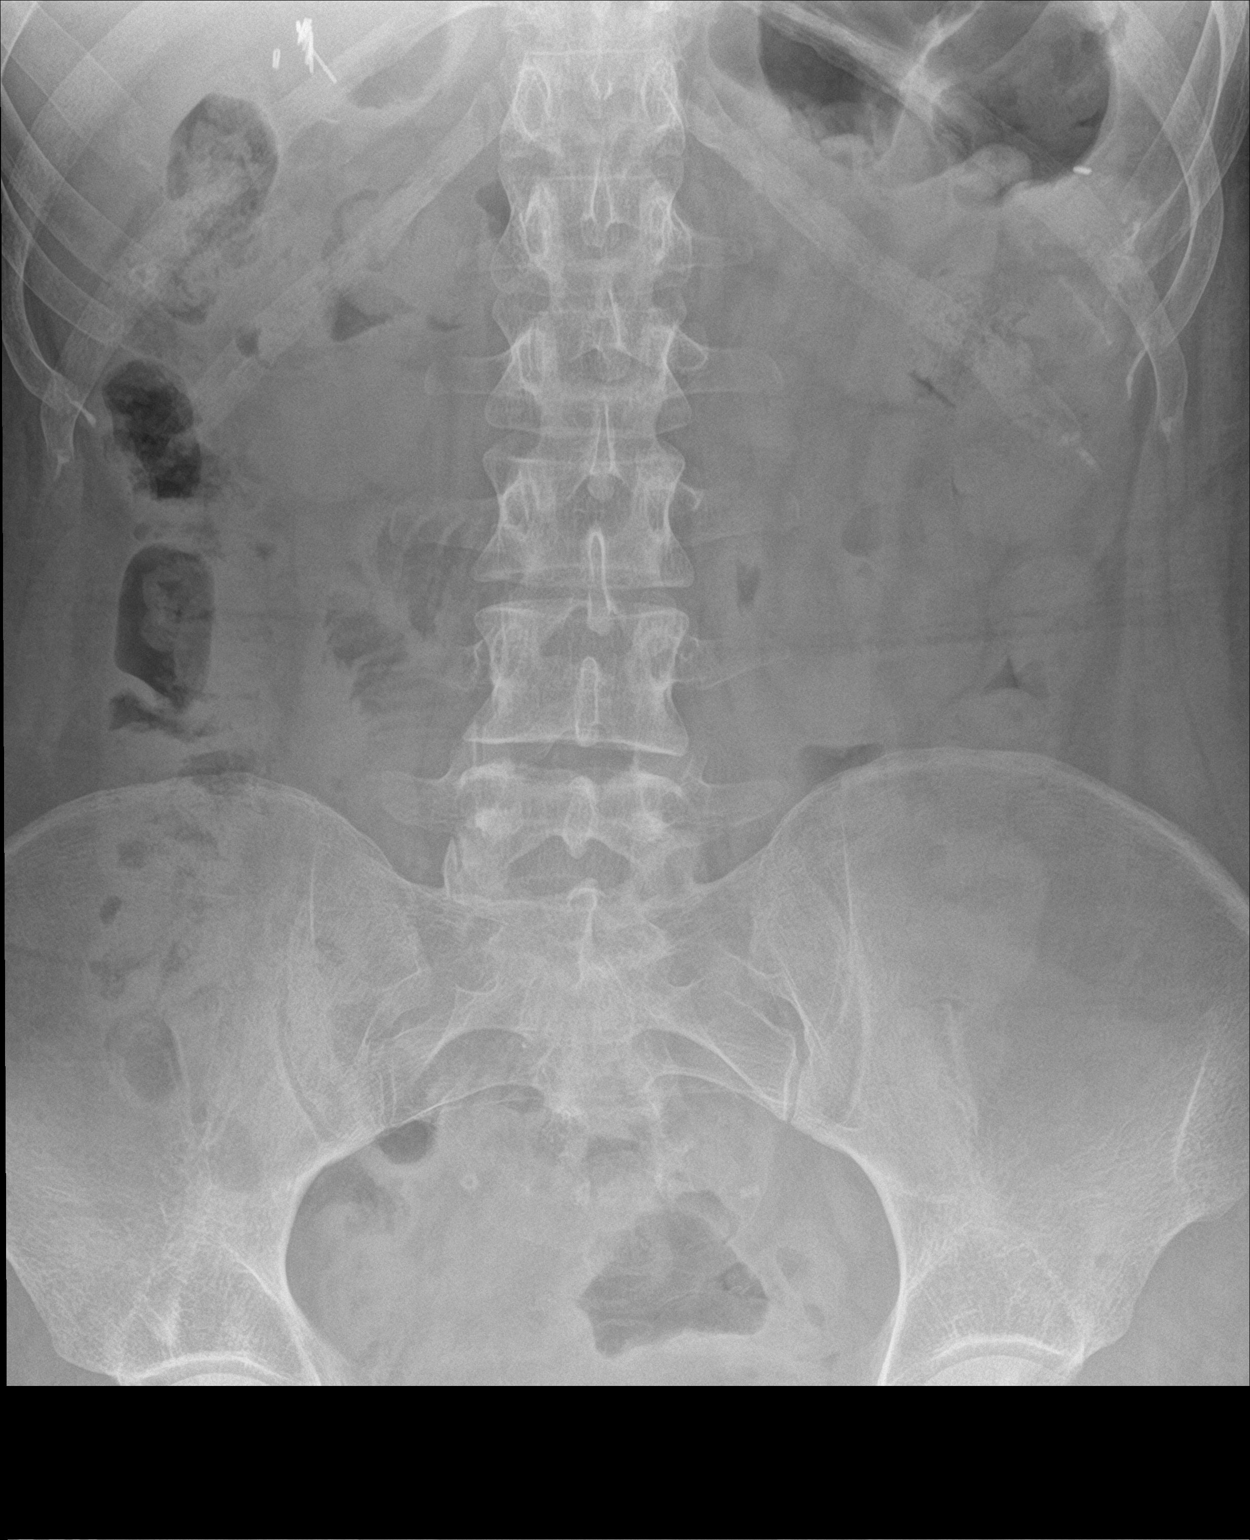

[1 of 1 positions shown; findings below may reference images not displayed]

FINDINGS: Status post cholecystectomy. No abnormal bowel dilatation is noted.
Moderate amount of stool seen throughout the colon. No abnormal
calcifications are noted.
IMPRESSION: Moderate stool burden is noted. No evidence of bowel obstruction or
ileus.

## 2018-04-28 ENCOUNTER — Other Ambulatory Visit (HOSPITAL_COMMUNITY): Payer: Self-pay | Admitting: Psychiatry

## 2018-05-04 ENCOUNTER — Telehealth (HOSPITAL_COMMUNITY): Payer: Self-pay | Admitting: *Deleted

## 2018-05-04 ENCOUNTER — Other Ambulatory Visit (HOSPITAL_COMMUNITY): Payer: Self-pay | Admitting: Psychiatry

## 2018-05-04 MED ORDER — CLONAZEPAM 0.5 MG PO TABS: 0.5000 mg | ORAL_TABLET | Freq: Three times a day (TID) | ORAL | 0 refills | Status: DC | PRN

## 2018-05-04 NOTE — Telephone Encounter (Signed)
I will send in enough to last 7 days . She missed last 2 appointments. If she misses this one she will be discharged

## 2018-05-04 NOTE — Telephone Encounter (Signed)
Dr Harrington Challenger Patient is scheduled for next appointment on 05/08/18 & is requesting refill on Klonopin

## 2018-05-08 ENCOUNTER — Encounter (HOSPITAL_COMMUNITY): Payer: Self-pay | Admitting: Psychiatry

## 2018-05-08 ENCOUNTER — Ambulatory Visit (HOSPITAL_COMMUNITY): Payer: Medicaid Other | Admitting: Psychiatry

## 2018-05-08 VITALS — BP 111/79 | HR 88 | Ht 66.0 in | Wt 174.0 lb

## 2018-05-08 DIAGNOSIS — F332 Major depressive disorder, recurrent severe without psychotic features: Secondary | ICD-10-CM | POA: Diagnosis not present

## 2018-05-08 DIAGNOSIS — Z818 Family history of other mental and behavioral disorders: Secondary | ICD-10-CM | POA: Diagnosis not present

## 2018-05-08 DIAGNOSIS — F17210 Nicotine dependence, cigarettes, uncomplicated: Secondary | ICD-10-CM | POA: Diagnosis not present

## 2018-05-08 DIAGNOSIS — G4700 Insomnia, unspecified: Secondary | ICD-10-CM | POA: Diagnosis not present

## 2018-05-08 DIAGNOSIS — F1290 Cannabis use, unspecified, uncomplicated: Secondary | ICD-10-CM | POA: Diagnosis not present

## 2018-05-08 MED ORDER — CLONIDINE HCL 0.1 MG PO TABS: 0.1000 mg | ORAL_TABLET | Freq: Every day | ORAL | 2 refills | Status: DC

## 2018-05-08 MED ORDER — CARIPRAZINE HCL 1.5 MG PO CAPS: 1.5000 mg | ORAL_CAPSULE | Freq: Every day | ORAL | 2 refills | Status: DC

## 2018-05-08 MED ORDER — CARBAMAZEPINE 200 MG PO TABS: ORAL_TABLET | ORAL | 2 refills | Status: DC

## 2018-05-08 MED ORDER — CLONAZEPAM 0.5 MG PO TABS: 0.5000 mg | ORAL_TABLET | Freq: Three times a day (TID) | ORAL | 2 refills | Status: DC | PRN

## 2018-05-08 MED ORDER — AMPHETAMINE-DEXTROAMPHET ER 30 MG PO CP24
30.0000 mg | ORAL_CAPSULE | ORAL | 0 refills | Status: DC
Start: 2018-05-08 — End: 2018-05-19

## 2018-05-08 MED ORDER — DULOXETINE HCL 60 MG PO CPEP
120.0000 mg | ORAL_CAPSULE | Freq: Every day | ORAL | 2 refills | Status: DC
Start: 2018-05-08 — End: 2018-12-12

## 2018-05-08 MED ORDER — AMPHETAMINE-DEXTROAMPHET ER 30 MG PO CP24: 30.0000 mg | ORAL_CAPSULE | Freq: Every day | ORAL | 0 refills | Status: DC

## 2018-05-08 MED ORDER — TOPIRAMATE 50 MG PO TABS: ORAL_TABLET | ORAL | 2 refills | Status: DC
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
## 2018-05-18 ENCOUNTER — Emergency Department (HOSPITAL_COMMUNITY)
Admission: EM | Admit: 2018-05-18 | Discharge: 2018-05-19 | Disposition: A | Discharge status: Home / Self Care | Payer: Worker's Compensation | Attending: Emergency Medicine | Admitting: Emergency Medicine

## 2018-05-18 ENCOUNTER — Encounter (HOSPITAL_COMMUNITY): Payer: Self-pay | Admitting: Emergency Medicine

## 2018-05-18 ENCOUNTER — Other Ambulatory Visit: Payer: Self-pay

## 2018-05-18 DIAGNOSIS — W1830XA Fall on same level, unspecified, initial encounter: Secondary | ICD-10-CM | POA: Insufficient documentation

## 2018-05-18 DIAGNOSIS — Y999 Unspecified external cause status: Secondary | ICD-10-CM | POA: Diagnosis not present

## 2018-05-18 DIAGNOSIS — S20409A Unspecified superficial injuries of unspecified back wall of thorax, initial encounter: Secondary | ICD-10-CM | POA: Diagnosis present

## 2018-05-18 DIAGNOSIS — Z91040 Latex allergy status: Secondary | ICD-10-CM | POA: Insufficient documentation

## 2018-05-18 DIAGNOSIS — Y929 Unspecified place or not applicable: Secondary | ICD-10-CM | POA: Diagnosis not present

## 2018-05-18 DIAGNOSIS — Y939 Activity, unspecified: Secondary | ICD-10-CM | POA: Insufficient documentation

## 2018-05-18 DIAGNOSIS — F17210 Nicotine dependence, cigarettes, uncomplicated: Secondary | ICD-10-CM | POA: Diagnosis not present

## 2018-05-18 DIAGNOSIS — Z79899 Other long term (current) drug therapy: Secondary | ICD-10-CM | POA: Insufficient documentation

## 2018-05-18 DIAGNOSIS — R202 Paresthesia of skin: Secondary | ICD-10-CM | POA: Diagnosis not present

## 2018-05-18 DIAGNOSIS — S22089A Unspecified fracture of T11-T12 vertebra, initial encounter for closed fracture: Secondary | ICD-10-CM | POA: Insufficient documentation

## 2018-05-18 DIAGNOSIS — F909 Attention-deficit hyperactivity disorder, unspecified type: Secondary | ICD-10-CM | POA: Insufficient documentation

## 2018-05-18 NOTE — ED Triage Notes (Signed)
Per [facility] pt fell from standing position at work today landing on butt and lower back. Pt c/o lower back pain.

## 2018-05-19 ENCOUNTER — Emergency Department (HOSPITAL_COMMUNITY): Payer: Worker's Compensation

## 2018-05-19 DIAGNOSIS — Z79899 Other long term (current) drug therapy: Secondary | ICD-10-CM | POA: Diagnosis not present

## 2018-05-19 DIAGNOSIS — W1830XA Fall on same level, unspecified, initial encounter: Secondary | ICD-10-CM | POA: Diagnosis not present

## 2018-05-19 DIAGNOSIS — S22089A Unspecified fracture of T11-T12 vertebra, initial encounter for closed fracture: Secondary | ICD-10-CM | POA: Diagnosis not present

## 2018-05-19 DIAGNOSIS — Y939 Activity, unspecified: Secondary | ICD-10-CM | POA: Diagnosis not present

## 2018-05-19 DIAGNOSIS — Y929 Unspecified place or not applicable: Secondary | ICD-10-CM | POA: Diagnosis not present

## 2018-05-19 DIAGNOSIS — Z91040 Latex allergy status: Secondary | ICD-10-CM | POA: Diagnosis not present

## 2018-05-19 DIAGNOSIS — S20409A Unspecified superficial injuries of unspecified back wall of thorax, initial encounter: Secondary | ICD-10-CM | POA: Diagnosis present

## 2018-05-19 DIAGNOSIS — F909 Attention-deficit hyperactivity disorder, unspecified type: Secondary | ICD-10-CM | POA: Diagnosis not present

## 2018-05-19 DIAGNOSIS — F17210 Nicotine dependence, cigarettes, uncomplicated: Secondary | ICD-10-CM | POA: Diagnosis not present

## 2018-05-19 DIAGNOSIS — Y999 Unspecified external cause status: Secondary | ICD-10-CM | POA: Diagnosis not present

## 2018-05-19 LAB — CBC
HCT: 37.9 % (ref 36.0–46.0)
HEMOGLOBIN: 12.5 g/dL (ref 12.0–15.0)
MCH: 32.2 pg (ref 26.0–34.0)
MCHC: 33 g/dL (ref 30.0–36.0)
MCV: 97.7 fL (ref 78.0–100.0)
Platelets: 254 10*3/uL (ref 150–400)
RBC: 3.88 MIL/uL (ref 3.87–5.11)
RDW: 12.6 % (ref 11.5–15.5)
WBC: 9.1 10*3/uL (ref 4.0–10.5)

## 2018-05-19 LAB — BASIC METABOLIC PANEL
Anion gap: 10 (ref 5–15)
BUN: 15 mg/dL (ref 6–20)
CHLORIDE: 106 mmol/L (ref 101–111)
CO2: 23 mmol/L (ref 22–32)
CREATININE: 0.64 mg/dL (ref 0.44–1.00)
Calcium: 9.3 mg/dL (ref 8.9–10.3)
GFR calc non Af Amer: >60 mL/min (ref >60)
Glucose, Bld: 91 mg/dL (ref 65–99)
POTASSIUM: 3.4 mmol/L — AB (ref 3.5–5.1)
SODIUM: 139 mmol/L (ref 135–145)

## 2018-05-19 LAB — I-STAT BETA HCG BLOOD, ED (MC, WL, AP ONLY): I-stat hCG, quantitative: <5 m[IU]/mL (ref <5)

## 2018-05-19 MED ORDER — PROMETHAZINE HCL 25 MG/ML IJ SOLN
25.0000 mg | Freq: Once | INTRAMUSCULAR | Status: AC
  Administered 2018-05-19: 25 mg via INTRAVENOUS
  Filled 2018-05-19: qty 1

## 2018-05-19 MED ORDER — HYDROMORPHONE HCL 1 MG/ML IJ SOLN
1.0000 mg | Freq: Once | INTRAMUSCULAR | Status: AC
  Administered 2018-05-19: 1 mg via INTRAVENOUS
  Filled 2018-05-19: qty 1

## 2018-05-19 MED ORDER — MORPHINE SULFATE (PF) 4 MG/ML IV SOLN
4.0000 mg | Freq: Once | INTRAVENOUS | Status: AC
  Administered 2018-05-19: 4 mg via INTRAVENOUS
  Filled 2018-05-19: qty 1

## 2018-05-19 MED ORDER — CYCLOBENZAPRINE HCL 10 MG PO TABS: 10.0000 mg | ORAL_TABLET | Freq: Two times a day (BID) | ORAL | 0 refills | Status: DC | PRN

## 2018-05-19 MED ORDER — LORAZEPAM 2 MG/ML IJ SOLN
1.0000 mg | Freq: Once | INTRAMUSCULAR | Status: AC
  Administered 2018-05-19: 1 mg via INTRAVENOUS
  Filled 2018-05-19: qty 1

## 2018-05-19 MED ORDER — OXYCODONE-ACETAMINOPHEN 5-325 MG PO TABS
1.0000 | ORAL_TABLET | Freq: Once | ORAL | Status: AC
  Administered 2018-05-19: 1 via ORAL
  Filled 2018-05-19: qty 1

## 2018-05-19 MED ORDER — HYDROMORPHONE HCL 2 MG/ML IJ SOLN
2.0000 mg | Freq: Once | INTRAMUSCULAR | Status: AC
  Administered 2018-05-19: 2 mg via INTRAMUSCULAR
  Filled 2018-05-19: qty 1

## 2018-05-19 NOTE — ED Notes (Signed)
Ortho at bedside.

## 2018-05-19 NOTE — ED Notes (Signed)
Paged Ortho  

## 2018-05-19 NOTE — Progress Notes (Signed)
Orthopedic Tech Progress Note Patient Details:  Penny [name] 03/25/1986 [id number]  Patient ID: Penny [name], female   DOB: Aug 21, 1986, 32 y.o.   MRN: [id number]   Hildred Priest 05/19/2018, 9:05 AM Called in bio-tech brace order; spoke with Bella Kennedy

## 2018-05-19 NOTE — ED Provider Notes (Signed)
This is a 32 year old female transfer from any pain emergency department to the ED for MRI of her lumbar spine.  Patient fell yesterday from a standing position and struck her back.  She developed lower back pain with numbness and tingling sensation down her left leg.  She also report having difficulty urinating.  A CT scan of the spine demonstrate a T12 endplate fracture.  An MRI was ordered to assess for further injury not presence on CT scan.  MRI of the thoracic and lumbar spine demonstrate acute mild T12 superior endplate compression fracture with 15% endplate loss of height but no retribution of bone or complicated features.  Incidental new hepatic periportal abnormalities were noted this is incidental and I discussed the finding with patient.  I encourage patient to follow-up with her primary care provider for further evaluation in regard to that finding.  I provide patient with a TLSO brace for support.  Patient discharged home with muscle relaxant, and outpatient follow-up with specialist. Pt able to ambulate prior to discharge.  BP 112/76 (BP Location: Left Arm)   Pulse (!) 57   Temp 97.8 F (36.6 C) (Oral)   Resp 17   Ht 5\' 6"  (1.676 m)   Wt 78.9 kg (174 lb)   SpO2 100%   BMI 28.08 kg/m   Results for orders placed or performed during the hospital encounter of 05/18/18  CBC  Result Value Ref Range   WBC 9.1 4.0 - 10.5 K/uL   RBC 3.88 3.87 - 5.11 MIL/uL   Hemoglobin 12.5 12.0 - 15.0 g/dL   HCT 37.9 36.0 - 46.0 %   MCV 97.7 78.0 - 100.0 fL   MCH 32.2 26.0 - 34.0 pg   MCHC 33.0 30.0 - 36.0 g/dL   RDW 12.6 11.5 - 15.5 %   Platelets 254 150 - 400 K/uL  Basic metabolic panel  Result Value Ref Range   Sodium 139 135 - 145 mmol/L   Potassium 3.4 (L) 3.5 - 5.1 mmol/L   Chloride 106 101 - 111 mmol/L   CO2 23 22 - 32 mmol/L   Glucose, Bld 91 65 - 99 mg/dL   BUN 15 6 - 20 mg/dL   Creatinine, Ser 0.64 0.44 - 1.00 mg/dL   Calcium 9.3 8.9 - 10.3 mg/dL   GFR calc non Af Amer >60 >60  mL/min   GFR calc Af Amer >60 >60 mL/min   Anion gap 10 5 - 15  I-Stat beta hCG blood, ED ([facility], [facility], [facility] only)  Result Value Ref Range   I-stat hCG, quantitative <5.0 <5 mIU/mL   Comment 3           Ct Lumbar Spine Wo Contrast  Result Date: 05/19/2018 CLINICAL DATA:  Fall with back pain EXAM: CT LUMBAR SPINE WITHOUT CONTRAST TECHNIQUE: Multidetector CT imaging of the lumbar spine was performed without intravenous contrast administration. Multiplanar CT image reconstructions were also generated. COMPARISON:  CT 12/18/2017 FINDINGS: Segmentation: 5 lumbar type vertebrae. Alignment: Normal. Vertebrae: Acute mild superior endplate fracture at L93 with extension of fracture lucency through the left superolateral aspect of the vertebral body. Minimal central depression. No retropulsion. No fracture lucency at the posterior elements. Paraspinal and other soft tissues: Negative. Disc levels: Mild disc bulge at L3-L4 and L4-L5. Disc bulge at L4-L5 extends into the right foramen. No significant canal stenosis. No significant bony foraminal narrowing. IMPRESSION: 1. Acute mild superior endplate fracture at X90 with minimal central depression but no retropulsion of fracture fragments. No  bony canal compromise. 2. Mild disc bulges at L3-L4 and L4-L5. Electronically Signed   By: Donavan Foil M.D.   On: 05/19/2018 02:48   Mr Thoracic Spine Wo Contrast  Result Date: 05/19/2018 CLINICAL DATA:  32 year old female status post fall backwards from standing with progressive thoracolumbar back pain, left leg numbness and tingling. T12 compression fracture on lumbar spine CT earlier today. EXAM: MRI THORACIC AND LUMBAR SPINE WITHOUT CONTRAST TECHNIQUE: Multiplanar and multiecho pulse sequences of the thoracic and lumbar spine were obtained without intravenous contrast. COMPARISON:  Lumbar spine CT 0216 hours today. Thoracic and lumbar MRI 06/11/2017. Total spine MRI 01/23/2016. FINDINGS: MRI THORACIC SPINE FINDINGS Limited  cervical spine imaging: Stable since 2018. Straightening and mild reversal of cervical lordosis. No cervical spinal stenosis suspected. Thoracic spine segmentation: Normal. Same numbering system used on the prior thoracic studies. Alignment: Stable thoracic vertebral height and alignment from T1-T11. T12 level described below. Vertebrae: T12 superior endplate fracture with curvilinear fracture plane surrounded by patchy marrow edema (series 21, image 11). Compression and loss of height are maximal along the left lateral aspect of the vertebra, up to 15% loss of height. Relatively preserved right lateral vertebral height. Marrow edema does not involve the pedicles or posterior elements. There is no retropulsion of bone. Chronic mild T8 superior endplate fracture is stable. No other marrow edema or acute osseous abnormality. Cord: Spinal cord signal is within normal limits at all visualized levels. Capacious thoracic spinal canal. Normal conus at T12-L1. Paraspinal and other soft tissues: No paraspinal hematoma. No paraspinal soft tissue edema is evident. Negative visible thoracic viscera. In the upper abdomen there is evidence of hepatic periportal edema (series 22, image 29) which is new since 2018, but perihepatic fluid identified, and the visible liver parenchyma otherwise appears normal. Disc levels: Stable. No disc herniation or spinal stenosis. Mild T9-T10 facet hypertrophy is stable with mild right T9 foraminal stenosis. MRI LUMBAR SPINE FINDINGS Segmentation:  Normal, concordant with the thoracic spine numbering. Alignment:  Stable with preserved lumbar lordosis. Vertebrae: No lumbar marrow edema or evidence of acute osseous abnormality. Visualized lower spinal bone marrow signal is within normal limits. Intact visible sacrum and SI joints. Conus medullaris and cauda equina: Conus extends to the T12-L1 level. Conus and proximal cauda equina appear normal. Paraspinal and other soft tissues: The liver is not  included on these images. Visualized abdominal viscera and paraspinal soft tissues are within normal limits. Disc levels: Chronic lumbar disc degeneration at L3-L4 and L4-L5: L3-L4: Slightly smaller right paracentral annular fissure superimposed on mild disc bulging. No associated neural impingement. L4-L5: Chronic broad-based small disc protrusion toward the right lateral recess with stable mild posterior displacement of the descending right L5 nerve roots (series 27, image 22). No spinal or foraminal stenosis. IMPRESSION: MR THORACIC SPINE IMPRESSION 1. Acute mild T12 superior endplate compression fracture. Compression is most pronounced along the left lateral superior endplate with loss of height up to 15%. No retropulsion of bone or complicating features. 2. Otherwise stable and largely negative MRI appearance of the thoracic spine since 2018. 3. New hepatic periportal signal abnormality compatible with new nonspecific periportal edema. Correlate with Liver Function Tests. Right Upper Quadrant Ultrasound might be valuable. MR LUMBAR SPINE IMPRESSION 1. Stable MRI appearance of the lumbar spine since 2018. 2. Chronic L3-L4 and L4-L5 disc degeneration, most affecting the descending right L5 nerve roots in the right lateral recess. Electronically Signed   By: Genevie Ann M.D.   On: 05/19/2018 07:59  Mr Lumbar Spine Wo Contrast  Result Date: 05/19/2018 CLINICAL DATA:  32 year old female status post fall backwards from standing with progressive thoracolumbar back pain, left leg numbness and tingling. T12 compression fracture on lumbar spine CT earlier today. EXAM: MRI THORACIC AND LUMBAR SPINE WITHOUT CONTRAST TECHNIQUE: Multiplanar and multiecho pulse sequences of the thoracic and lumbar spine were obtained without intravenous contrast. COMPARISON:  Lumbar spine CT 0216 hours today. Thoracic and lumbar MRI 06/11/2017. Total spine MRI 01/23/2016. FINDINGS: MRI THORACIC SPINE FINDINGS Limited cervical spine imaging:  Stable since 2018. Straightening and mild reversal of cervical lordosis. No cervical spinal stenosis suspected. Thoracic spine segmentation: Normal. Same numbering system used on the prior thoracic studies. Alignment: Stable thoracic vertebral height and alignment from T1-T11. T12 level described below. Vertebrae: T12 superior endplate fracture with curvilinear fracture plane surrounded by patchy marrow edema (series 21, image 11). Compression and loss of height are maximal along the left lateral aspect of the vertebra, up to 15% loss of height. Relatively preserved right lateral vertebral height. Marrow edema does not involve the pedicles or posterior elements. There is no retropulsion of bone. Chronic mild T8 superior endplate fracture is stable. No other marrow edema or acute osseous abnormality. Cord: Spinal cord signal is within normal limits at all visualized levels. Capacious thoracic spinal canal. Normal conus at T12-L1. Paraspinal and other soft tissues: No paraspinal hematoma. No paraspinal soft tissue edema is evident. Negative visible thoracic viscera. In the upper abdomen there is evidence of hepatic periportal edema (series 22, image 29) which is new since 2018, but perihepatic fluid identified, and the visible liver parenchyma otherwise appears normal. Disc levels: Stable. No disc herniation or spinal stenosis. Mild T9-T10 facet hypertrophy is stable with mild right T9 foraminal stenosis. MRI LUMBAR SPINE FINDINGS Segmentation:  Normal, concordant with the thoracic spine numbering. Alignment:  Stable with preserved lumbar lordosis. Vertebrae: No lumbar marrow edema or evidence of acute osseous abnormality. Visualized lower spinal bone marrow signal is within normal limits. Intact visible sacrum and SI joints. Conus medullaris and cauda equina: Conus extends to the T12-L1 level. Conus and proximal cauda equina appear normal. Paraspinal and other soft tissues: The liver is not included on these images.  Visualized abdominal viscera and paraspinal soft tissues are within normal limits. Disc levels: Chronic lumbar disc degeneration at L3-L4 and L4-L5: L3-L4: Slightly smaller right paracentral annular fissure superimposed on mild disc bulging. No associated neural impingement. L4-L5: Chronic broad-based small disc protrusion toward the right lateral recess with stable mild posterior displacement of the descending right L5 nerve roots (series 27, image 22). No spinal or foraminal stenosis. IMPRESSION: MR THORACIC SPINE IMPRESSION 1. Acute mild T12 superior endplate compression fracture. Compression is most pronounced along the left lateral superior endplate with loss of height up to 15%. No retropulsion of bone or complicating features. 2. Otherwise stable and largely negative MRI appearance of the thoracic spine since 2018. 3. New hepatic periportal signal abnormality compatible with new nonspecific periportal edema. Correlate with Liver Function Tests. Right Upper Quadrant Ultrasound might be valuable. MR LUMBAR SPINE IMPRESSION 1. Stable MRI appearance of the lumbar spine since 2018. 2. Chronic L3-L4 and L4-L5 disc degeneration, most affecting the descending right L5 nerve roots in the right lateral recess. Electronically Signed   By: Genevie Ann M.D.   On: 05/19/2018 07:59      Domenic Moras, PA-C 05/19/18 1004    Veryl Speak, MD 05/23/18 [phone]

## 2018-05-19 NOTE — Discharge Instructions (Addendum)
You have suffered a T12 vertebral endplate fracture.  Please wear your back brace for support.  Follow up closely with your pain clinic for management of your pain. Follow up with back doctor for further care.  You also have some abnormal changes in your liver.  Discuss this with your doctor for further management.

## 2018-05-19 NOTE — ED Provider Notes (Signed)
[facility] Provider Note   CSN: [id number] Arrival date & time: 05/18/18  2354     History   Chief Complaint Chief Complaint  Patient presents with  . Fall    HPI Penny [name] is a 32 y.o. female.  Patient presents to the emergency department for evaluation of back pain after a fall.  Patient reports that she fell around 10:30 AM today.  She was in a standing position and fell backwards into the left, landing onto her backside.  She has been having progressively worsening pain in the lower back ever since.  She was able to finish working her shift but since going home the pain is significantly worsened, now having numbness and tingling of the left leg.  She is having trouble moving the leg, likely secondary to severe pain.  She does, however, report that she has not been urinating as much today as she normally does.  She did not hit her head or injure herself anywhere else.     Past Medical History:  Diagnosis Date  . ADD (attention deficit disorder)   . Anemia 2011   2o to GASTRIC BYPASS  . Anginal pain ([place])   . Anxiety   . Blood transfusion without reported diagnosis   . Chronic daily headache   . Depression   . Depression   . Dysmenorrhea 09/18/2013  . Dysrhythmia   . Elevated liver enzymes JUL 2011ALK PHOS 111-127 AST  143-267 ALT  213-321T BILI 0.6  ALB  3.7-4.06 Jun 2011 ALK PHOS 118 AST 24 ALT 42 T BILI 0.4 ALB 3.9  . Encounter for drug rehabilitation    at behavioral health for opioid addiction about 4 years ago  . Family history of adverse reaction to anesthesia    'dad had to be kept on pump for breathing for morphine'  . Fatty liver   . Fibroid 01/18/2017  . Fibromyalgia   . Gastric bypass status for obesity   . Gastritis JULY 2011  . Heart murmur   . 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 History of Holter monitoring   . Hx of opioid abuse    for about 4 years, about 4 years ago  . Interstitial cystitis     . Iron deficiency anemia 07/23/2010  . Irritable bowel syndrome 2012 DIARRHEA   JUN 2012 TTG IgA 14.9  . IUD FEB 2010  . Lupus ([place])   . Menorrhagia 07/18/2013  . Migraines   . Obesity (BMI 30-39.9) 2011 228 LBS BMI 36.8  . Ovarian cyst   . Patient desires pregnancy 09/18/2013  . Polyneuropathy   . PONV (postoperative nausea and vomiting) seizure post-operatively  . Potassium (K) deficiency   . Pregnant 12/25/2013  . Psychiatric pseudoseizure   . RLQ abdominal pain 07/18/2013  . Sciatica of left side 11/14/2014  . 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 Stress 09/18/2013    Patient Active Problem List   Diagnosis Date Noted  . Herpes genitalis in women 12/19/2017  . Abnormal urine odor 03/30/2017  . Urinary tract infection with hematuria 03/30/2017  . Screening for genitourinary condition 03/30/2017  . Pain 03/30/2017  . Burning with urination 03/30/2017  . Family history of breast cancer in mother,uncertain BR CA status 02/28/2017  . Fibroid 01/18/2017  . Chronic pain syndrome 12/05/2016  . Folate deficiency 12/04/2016  . Nausea vomiting and diarrhea 12/04/2016  . Severe anemia 12/04/2016  . Symptomatic anemia 12/03/2016  . Complaints  of leg weakness   . Paresthesia   . Left-sided weakness   . Uncontrolled pain 01/24/2016  . Malnutrition of moderate degree 01/24/2016  . Neuropathy 01/23/2016  . Inability to walk 01/23/2016  . Paresthesias   . Bilateral leg numbness 11/26/2015  . 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 Post partum depression 05/09/2015  . Anastomotic ulcer   . Dysphagia   . Hematemesis 04/07/2015  . Intractable vomiting with nausea 04/07/2015  . Elevated liver enzymes   . Epigastric pain   . Pseudoseizure ([place]) 02/22/2015  . Seizure-like activity ([place]) 02/22/2015  . Fibromyalgia 02/18/2015  . Vulvar fissure 02/18/2015  . Clinical depression 02/01/2015  . Current smoker 01/29/2015  .  Current tobacco use 01/29/2015  . 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 Leg weakness, bilateral 01/02/2015  . Gait disorder 01/02/2015  . Other symptoms and signs involving the musculoskeletal system 01/02/2015  . Abnormal gait 01/02/2015  . Cervicalgia 12/18/2014  . Sciatica of left side 11/14/2014  . Neuralgia neuritis, sciatic nerve 11/14/2014  . 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 Encounter for sterilization 09/09/2014  . Abdominal pain, acute, right lower quadrant 08/22/2014  . Headache, migraine 08/19/2014  . Seizure ([place]) 08/18/2014  . Encephalopathy 08/13/2014  . Headache 08/13/2014  . Altered mental status 08/13/2014  . Right leg numbness 07/31/2014  . Disturbance of skin sensation 07/31/2014  . Hypertension in pregnancy, transient 07/08/2014  . Previous gastric bypass affecting pregnancy, antepartum 05/22/2014  . 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 ASD (atrial septal defect), ostium secundum 05/17/2014  . Depression complicating pregnancy in second trimester, antepartum 05/09/2014  . Antepartum mental disorder in pregnancy 05/09/2014  . Rapid palpitations 02/18/2014  . H/O maternal third degree perineal laceration, currently pregnant 02/18/2014  . High-risk pregnancy 02/18/2014  . Supervision of pregnancy with other poor reproductive or obstetric history, unspecified trimester 02/18/2014  . Restless legs 01/16/2014  . Restless leg 01/16/2014  . Chronic interstitial cystitis 10/23/2013  . Menorrhagia 07/18/2013  . Excessive and frequent menstruation 07/18/2013  . h/o Opiate addiction 03/11/2012    Class: Acute  . History of migraine headaches 03/10/2012    Class: Acute  . Depression with anxiety 03/10/2012    Class: Chronic  . 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 Panic disorder  without agoraphobia 03/10/2012  . H/O disease 03/10/2012  . Dysthymia 03/10/2012  . Pelvic congestion syndrome 10/13/2011  . Coitalgia 10/13/2011  . Chronic migraine without aura 10/13/2011  . Unspecified dyspareunia 10/13/2011  . IBS (irritable bowel syndrome) 08/25/2011  . Diarrhea 05/27/2011  . OBESITY, UNSPECIFIED 09/17/2010  . Transaminitis 09/17/2010  . Anemia, iron deficiency 07/23/2010    Past Surgical History:  Procedure Laterality Date  . BIOPSY  04/10/2015   Procedure: BIOPSY;  Surgeon: Daneil Dolin, MD;  Location: [facility];  Service: Endoscopy;;  . cath self every nite     for sodium bicarb injection (discontinued 2013)  . CHOLECYSTECTOMY  2005   biliary dyskinesia  . COLONOSCOPY  JUN 2012 ABD PN/DIARRHEA WITH PROPOFOL   NL COLON  . DILATION AND CURETTAGE OF UTERUS    . DILITATION & CURRETTAGE/HYSTROSCOPY WITH NOVASURE ABLATION N/A 03/24/2017   Procedure: DILATATION & CURETTAGE/HYSTEROSCOPY WITH NOVASURE ENDOMETRIAL ABLATION;  Surgeon: Jonnie Kind, MD;  Location: [facility];  Service: Gynecology;  Laterality: N/A;  . ESOPHAGEAL DILATION N/A 04/10/2015   Procedure: ESOPHAGEAL DILATION WITH 54FR MALONEY DILATOR;  Surgeon: Daneil Dolin, MD;  Location: [facility];  Service: Endoscopy;  Laterality: N/A;  . ESOPHAGOGASTRODUODENOSCOPY    . ESOPHAGOGASTRODUODENOSCOPY (EGD) WITH PROPOFOL N/A 04/10/2015   Procedure: ESOPHAGOGASTRODUODENOSCOPY (EGD) WITH PROPOFOL;  Surgeon: Daneil Dolin, MD;  Location: [facility];  Service: Endoscopy;  Laterality: N/A;  . ESOPHAGOGASTRODUODENOSCOPY (EGD) WITH PROPOFOL N/A 12/06/2016   Procedure: ESOPHAGOGASTRODUODENOSCOPY (EGD) WITH PROPOFOL;  Surgeon: Danie Binder, MD;  Location: [facility];  Service: Endoscopy;  Laterality: N/A;  . GAB  2007   in [place]-POUCH 5 CM  . GASTRIC BYPASS  06/2006  . HYSTEROSCOPY W/D&C N/A 09/12/2014   Procedure: DILATATION AND CURETTAGE /HYSTEROSCOPY;  Surgeon: Jonnie Kind, MD;  Location: [facility];  Service:  Gynecology;  Laterality: N/A;  . LAPAROSCOPIC TUBAL LIGATION Bilateral 03/24/2017   Procedure: LAPAROSCOPIC TUBAL LIGATION ([place] Rings);  Surgeon: Jonnie Kind, MD;  Location: [facility];  Service: Gynecology;  Laterality: Bilateral;  . REPAIR VAGINAL CUFF N/A 07/30/2014   Procedure: REPAIR VAGINAL CUFF;  Surgeon: Mora Bellman, MD;  Location: [facility];  Service: Gynecology;  Laterality: N/A;  . SAVORY DILATION  06/20/2012   Dr. Barnie Alderman gastritis/Ulcer in the mid jejunum. Empiric dilation.   . SURAL NERVE BX Left 02/25/2016   Procedure: LEFT SURAL NERVE BIOPSY;  Surgeon: Jovita Gamma, MD;  Location: [facility];  Service: Neurosurgery;  Laterality: Left;  Left sural nerve biopsy  . TONSILLECTOMY    . TONSILLECTOMY AND ADENOIDECTOMY    . UPPER GASTROINTESTINAL ENDOSCOPY  JULY 2011 NAUSEA-D125,V6, PH 25   Bx; GASTRITIS, POUCH-5 CM LONG  . WISDOM TOOTH EXTRACTION       OB History    Gravida  3   Para  2   Term  1   Preterm  1   AB  1   Living  2     SAB  1   TAB      Ectopic      Multiple      Live Births  2            Home Medications    Prior to Admission medications   Medication Sig Start Date End Date Taking? Authorizing Provider  amphetamine-dextroamphetamine (ADDERALL XR) 30 MG 24 hr capsule Take 1 capsule (30 mg total) by mouth every morning. 05/08/18   Cloria Spring, MD  amphetamine-dextroamphetamine (ADDERALL XR) 30 MG 24 hr capsule Take 1 capsule (30 mg total) by mouth daily. 05/08/18   Cloria Spring, MD  carbamazepine (TEGRETOL) 200 MG tablet Take two tablets at bedtime 05/08/18   Cloria Spring, MD  cariprazine [facility]) capsule Take 1 capsule (1.5 mg total) by mouth daily. 05/08/18   Cloria Spring, MD  clonazePAM (KLONOPIN) 0.5 MG tablet Take 1 tablet (0.5 mg total) by mouth 3 (three) times daily as needed for anxiety. 05/08/18 05/08/19  Cloria Spring, MD  cloNIDine (CATAPRES) 0.1 MG tablet Take 1 tablet (0.1 mg total) by mouth at bedtime. 05/08/18    Cloria Spring, MD  DULoxetine (CYMBALTA) 60 MG capsule Take 2 capsules (120 mg total) by mouth at bedtime. 05/08/18   Cloria Spring, MD  HYDROcodone Bitartrate ER [facility]) 20 MG T24A Take 30 mg by mouth at bedtime.     [provider]  pantoprazole (PROTONIX) 40 MG tablet Take 1 tablet (40 mg total) by mouth daily.  11/10/17   Setzer, Rona Ravens, NP  Pediatric Multivitamins-Iron (FLINTSTONES PLUS IRON PO) Take 1 tablet by mouth 2 (two) times daily.     [provider]  phenazopyridine (PYRIDIUM) 200 MG tablet Take 1 tablet (200 mg total) by mouth 3 (three) times daily with meals. Take for 3 days 01/13/18   Jonnie Kind, MD  polyethylene glycol powder (GLYCOLAX/MIRALAX) powder Take 17 g by mouth daily. 04/16/18   Rogene Houston, MD  PROAIR HFA [phone] Base) MCG/ACT inhaler INHALE 2 PUFFS INTO THE LUNGS EVERY 6 HOURS AS NEEDED. 06/14/17   Kathyrn Drown, MD  promethazine (PHENERGAN) 25 MG tablet TAKE 1 TABLET BY MOUTH EVERY 6 HOURS AS NEEDED. 03/15/18   Estill Dooms, NP  topiramate (TOPAMAX) 50 MG tablet Take one in the am and 2 at night 05/08/18   Cloria Spring, MD  valACYclovir (VALTREX) 500 MG tablet Take 1 tablet (500 mg total) by mouth daily. 12/23/17   Jonnie Kind, MD    Family History Family History  Problem Relation Age of Onset  . Hemochromatosis Maternal Grandmother   . Migraines Maternal Grandmother   . Cancer Maternal Grandmother   . Breast cancer Maternal Grandmother   . Hypertension Father   . Diabetes Father   . Coronary artery disease Father   . Migraines Paternal Grandmother   . Breast cancer Paternal Grandmother   . Cancer Mother        breast  . Hemochromatosis Mother   . Breast cancer Mother   . Depression Mother   . Anxiety disorder Mother   . Coronary artery disease Paternal Grandfather   . Anxiety disorder Brother   . Bipolar disorder Brother   . Healthy Daughter   . Healthy Son     Social History Social History   Tobacco  Use  . Smoking status: Current Every Day Smoker    Packs/day: 0.25    Years: 6.00    Pack years: 1.50    Types: E-cigarettes  . Smokeless tobacco: Never Used  Substance Use Topics  . Alcohol use: Yes    Alcohol/week: 0.0 oz    Comment: occasionally  . Drug use: Not Currently    Types: Marijuana    Comment: Previously opioid addiction went through rehab     Allergies   Gabapentin; Metoclopramide hcl; Tramadol; Nucynta [tapentadol]; Propofol; Trazodone and nefazodone; Zofran [ondansetron]; Ativan [lorazepam]; Butrans [buprenorphine]; Toradol [ketorolac tromethamine]; Latex; Lyrica [pregabalin]; and Tape   Review of Systems Review of Systems  Musculoskeletal: Positive for back pain.  All other systems reviewed and are negative.    Physical Exam Updated Vital Signs BP 120/83 (BP Location: Left Arm)   Pulse 88   Temp 98.1 F (36.7 C) (Oral)   Resp 16   Ht '5\' 6"'$  (1.676 m)   Wt 78.9 kg (174 lb)   SpO2 98%   BMI 28.08 kg/m   Physical Exam  Constitutional: She is oriented to person, place, and time. She appears well-developed and well-nourished. No distress.  HENT:  Head: Normocephalic and atraumatic.  Right Ear: Hearing normal.  Left Ear: Hearing normal.  Nose: Nose normal.  Mouth/Throat: Oropharynx is clear and moist and mucous membranes are normal.  Eyes: Pupils are equal, round, and reactive to light. Conjunctivae and EOM are normal.  Neck: Normal range of motion. Neck supple.  Cardiovascular: Regular rhythm, S1 normal and S2 normal. Exam reveals no gallop and no friction rub.  No murmur heard. Pulmonary/Chest: Effort normal  and breath sounds normal. No respiratory distress. She exhibits no tenderness.  Abdominal: Soft. Normal appearance and bowel sounds are normal. There is no hepatosplenomegaly. There is no tenderness. There is no rebound, no guarding, no tenderness at McBurney's point and negative Murphy's sign. No hernia.  Genitourinary:  Genitourinary Comments:  Normal rectal tone  Musculoskeletal: Normal range of motion.       Lumbar back: She exhibits tenderness.       Back:  Neurological: She is alert and oriented to person, place, and time. A sensory deficit (+/- left lower extremity) is present. No cranial nerve deficit. Coordination normal. GCS eye subscore is 4. GCS verbal subscore is 5. GCS motor subscore is 6.  Significantly decreased effort to move left leg secondary to pain  Skin: Skin is warm, dry and intact. No rash noted. No cyanosis.  Psychiatric: She has a normal mood and affect. Her speech is normal and behavior is normal. Thought content normal.  Nursing note and vitals reviewed.    ED Treatments / Results  Labs (all labs ordered are listed, but only abnormal results are displayed) Labs Reviewed  BASIC METABOLIC PANEL - Abnormal; Notable for the following components:      Result Value   Potassium 3.4 (*)    All other components within normal limits  CBC  I-STAT BETA HCG BLOOD, ED ([facility], [facility], [facility] ONLY)    EKG None  Radiology Ct Lumbar Spine Wo Contrast  Result Date: 05/19/2018 CLINICAL DATA:  Fall with back pain EXAM: CT LUMBAR SPINE WITHOUT CONTRAST TECHNIQUE: Multidetector CT imaging of the lumbar spine was performed without intravenous contrast administration. Multiplanar CT image reconstructions were also generated. COMPARISON:  CT 12/18/2017 FINDINGS: Segmentation: 5 lumbar type vertebrae. Alignment: Normal. Vertebrae: Acute mild superior endplate fracture at Z60 with extension of fracture lucency through the left superolateral aspect of the vertebral body. Minimal central depression. No retropulsion. No fracture lucency at the posterior elements. Paraspinal and other soft tissues: Negative. Disc levels: Mild disc bulge at L3-L4 and L4-L5. Disc bulge at L4-L5 extends into the right foramen. No significant canal stenosis. No significant bony foraminal narrowing. IMPRESSION: 1. Acute mild superior endplate fracture at F09 with  minimal central depression but no retropulsion of fracture fragments. No bony canal compromise. 2. Mild disc bulges at L3-L4 and L4-L5. Electronically Signed   By: Donavan Foil M.D.   On: 05/19/2018 02:48    Procedures Procedures (including critical care time)  Medications Ordered in ED Medications  HYDROmorphone (DILAUDID) injection 1 mg (has no administration in time range)  HYDROmorphone (DILAUDID) injection 2 mg (2 mg Intramuscular Given 05/19/18 0132)     Initial Impression / Assessment and Plan / ED Course  I have reviewed the triage vital signs and the nursing notes.  Pertinent labs & imaging results that were available during my care of the patient were reviewed by me and considered in my medical decision making (see chart for details).     Presents with complaints of low back pain after a fall which occurred earlier today.  Patient complaining of progressively worsening and now severe, constant pain.  She reports subjectively decreased sensation of the left lower extremity and cannot move the leg, possibly because of painful inhibition.  Minimal movement causes severe worsening of her pain.  A CT scan, however, did show evidence of an T12 endplate fracture.  No evidence of retropulsion or any obvious impingement on nerve roots, this fracture would not explain any neurologic deficits that she  appears to be exhibiting tonight.  She will therefore, require MRI for further evaluation.  This cannot be performed here tonight, will be transferred to [facility] for further evaluation.  Final Clinical Impressions(s) / ED Diagnoses   Final diagnoses:  Closed fracture of twelfth thoracic vertebra, unspecified fracture morphology, initial encounter [facility])    ED Discharge Orders    None       Orpah Greek, MD 05/19/18 [id number]

## 2018-05-19 NOTE — ED Notes (Signed)
Patient given discharge instructions and verbalized understanding.  Patient stable to discharge at this time.  Patient is alert and oriented to baseline.  No distressed noted at this time.  All belongings taken with the patient at discharge.   

## 2018-05-19 NOTE — ED Notes (Signed)
Patient transported to MRI 

## 2018-05-31 ENCOUNTER — Encounter: Payer: Self-pay | Admitting: Family Medicine

## 2018-05-31 ENCOUNTER — Ambulatory Visit (INDEPENDENT_AMBULATORY_CARE_PROVIDER_SITE_OTHER): Payer: Medicaid Other | Admitting: Family Medicine

## 2018-05-31 VITALS — BP 104/70 | Ht 66.0 in | Wt 184.0 lb

## 2018-05-31 DIAGNOSIS — M545 Low back pain, unspecified: Secondary | ICD-10-CM

## 2018-05-31 DIAGNOSIS — M542 Cervicalgia: Secondary | ICD-10-CM | POA: Diagnosis not present

## 2018-05-31 DIAGNOSIS — M546 Pain in thoracic spine: Secondary | ICD-10-CM

## 2018-05-31 NOTE — Progress Notes (Signed)
   Subjective:    Patient ID: Penny [name], female    DOB: 04-11-1986, 32 y.o.   MRN: [id number]  HPI  Patient is here today s/p fall.She states she fell June 13th and fractured T12 in her back. She fell again this am down 15 steps. She states she does not remember if she passed up or what had caused her to fall this am.She states she has a headache now.More pain in left hip, left arm and neck. She is already in a brace. The ER notes were reviewed Unfortunately the patient did sustain a fracture She is on pain medication When I asked her about this she states the oxycodone is not helping She states that her chronic pain medicine was on back order I checked the drug registry and she did pick up her prescriptions and according to the drug registry she has plenty of medication for current usage I confronted her on this and she stated she only got 30 pills of her long-acting medicine I told her I had to follow what was on the drug registry saying that she got 60 pills She proceeded to state that she needed something for her pain I did discuss with her how she is under the care of a pain medicine specialist and that her contract is with them Also in the past we used to prescribe her pain medicine but are no longer in that capacity-and we will not be in that capacity The patient requested muscle relaxers because she said that would help her Review of Systems She complains of neck pain back pain lower back pain denies any radiation she complains of a mild headache she does not think she lost consciousness when she fell no nausea no vomiting no double vision or blurred vision    Objective:   Physical Exam Patient carefully took off her brace Lungs were clear respiratory rate normal heart regular no murmurs subjective discomfort in the neck mid back and lower back Patient was fully closed when she was examined       Assessment & Plan:  I am sympathetic to this patient I believe she is  suffering with pain Her compression fracture of T12 is approximately 15% It is best for her to follow-up with her pain medicine doctor if her pain medicine is not helping enough I absolutely do not feel comfortable prescribing muscle relaxers to a patient on narcotic pain medicine I declined filling this At the patient request we will send copy of what we are doing to her pain management doctor and perhaps they will adjust her medicines or perhaps consider muscle relaxer  I did recommend for the patient to have x-rays of her neck thoracic spine and lumbar spine but she declined to do this today stating she will do it somewhere in the near future I encouraged her to get this completed within the next 12 to 14 hours  Follow-up here as needed go to ER if worse

## 2018-06-02 ENCOUNTER — Ambulatory Visit (HOSPITAL_COMMUNITY)
Admission: RE | Admit: 2018-06-02 | Discharge: 2018-06-02 | Disposition: A | Discharge status: Home / Self Care | Payer: Worker's Compensation | Source: Ambulatory Visit | Attending: Family Medicine | Admitting: Family Medicine

## 2018-06-02 ENCOUNTER — Telehealth: Payer: Self-pay | Admitting: Family Medicine

## 2018-06-02 DIAGNOSIS — M546 Pain in thoracic spine: Secondary | ICD-10-CM | POA: Diagnosis not present

## 2018-06-02 DIAGNOSIS — M438X4 Other specified deforming dorsopathies, thoracic region: Secondary | ICD-10-CM | POA: Insufficient documentation

## 2018-06-02 DIAGNOSIS — M542 Cervicalgia: Secondary | ICD-10-CM | POA: Diagnosis not present

## 2018-06-02 DIAGNOSIS — M48061 Spinal stenosis, lumbar region without neurogenic claudication: Secondary | ICD-10-CM | POA: Diagnosis not present

## 2018-06-02 DIAGNOSIS — M545 Low back pain: Secondary | ICD-10-CM | POA: Insufficient documentation

## 2018-06-02 NOTE — Telephone Encounter (Signed)
Results discussed with patient-see result notes-Patient verbalized understanding.

## 2018-06-02 NOTE — Telephone Encounter (Signed)
Patient just had her X-ray done that Dr. Nicki Reaper sent her for and she would like the results today.

## 2018-06-14 DIAGNOSIS — M5442 Lumbago with sciatica, left side: Secondary | ICD-10-CM | POA: Diagnosis not present

## 2018-06-14 DIAGNOSIS — Z79899 Other long term (current) drug therapy: Secondary | ICD-10-CM | POA: Diagnosis not present

## 2018-06-14 DIAGNOSIS — G8929 Other chronic pain: Secondary | ICD-10-CM | POA: Diagnosis not present

## 2018-06-14 DIAGNOSIS — M5441 Lumbago with sciatica, right side: Secondary | ICD-10-CM | POA: Diagnosis not present

## 2018-06-14 DIAGNOSIS — M5412 Radiculopathy, cervical region: Secondary | ICD-10-CM | POA: Diagnosis not present

## 2018-07-03 ENCOUNTER — Other Ambulatory Visit (HOSPITAL_COMMUNITY): Payer: Self-pay | Admitting: Psychiatry

## 2018-07-03 ENCOUNTER — Other Ambulatory Visit: Payer: Self-pay | Admitting: Adult Health

## 2018-07-06 ENCOUNTER — Other Ambulatory Visit: Payer: Self-pay | Admitting: Obstetrics and Gynecology

## 2018-07-06 ENCOUNTER — Other Ambulatory Visit (HOSPITAL_COMMUNITY): Payer: Self-pay | Admitting: Psychiatry

## 2018-07-08 ENCOUNTER — Other Ambulatory Visit (HOSPITAL_COMMUNITY): Payer: Self-pay | Admitting: Psychiatry

## 2018-07-09 IMAGING — MR MR LUMBAR SPINE W/O CM
8 of 11 series · 29 of 48 positions shown · non-contrast
Comparison: Lumbar spine CT 1123 hours today. Thoracic and lumbar
MRI 06/11/2017. Total spine MRI 01/23/2016.

CLINICAL DATA: 31-year-old female status post fall backwards from
standing with progressive thoracolumbar back pain, left leg numbness
and tingling. T12 compression fracture on lumbar spine CT earlier
today.

EXAM:
MRI THORACIC AND LUMBAR SPINE WITHOUT CONTRAST
TECHNIQUE: Multiplanar and multiecho pulse sequences of the thoracic and lumbar
spine were obtained without intravenous contrast.

[Series 18: T1 · sagittal · 6.0mm · 1.23mm/px · 2 of 8 slices shown (1 of 4)]
[im 1/8]
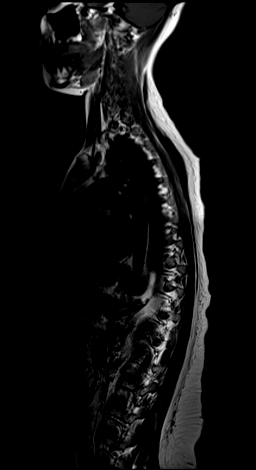
[im 8/8]
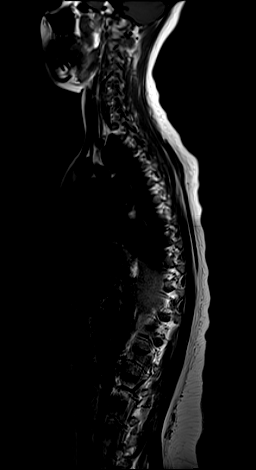

[Series 19: T2 · sagittal · 3.0mm · 0.76mm/px · 3 of 17 slices shown (1 of 4)]
[im 1/17]
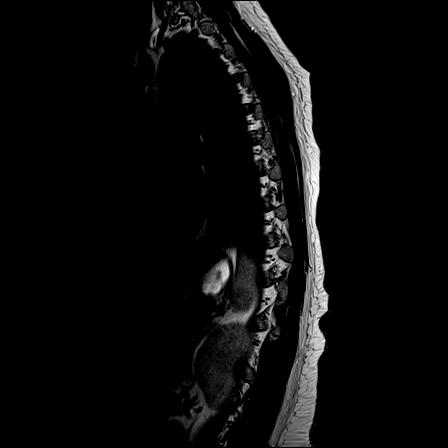
[im 9/17]
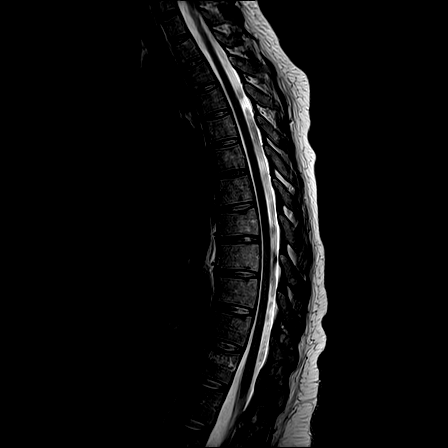
[im 17/17]
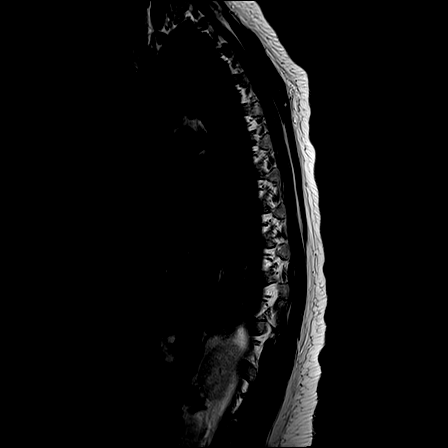

[Series 20: T1 · sagittal · 3.0mm · 0.76mm/px · 3 of 17 slices shown (2 of 4)]
[im 1/17]
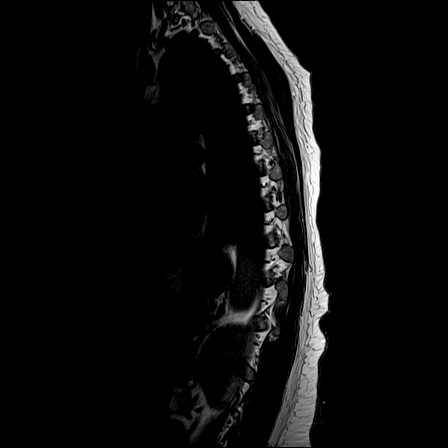
[im 9/17]
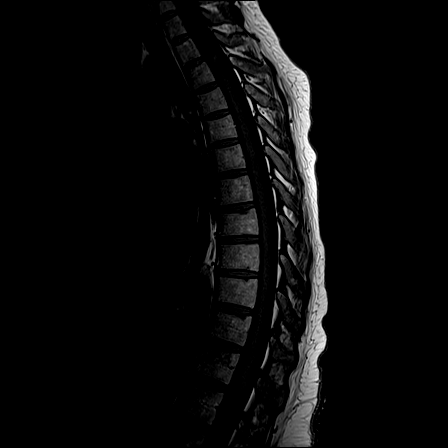
[im 17/17]
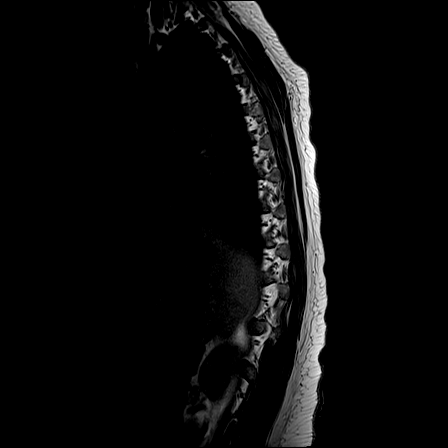

[Series 22: T2 · axial · 4.0mm · 0.62mm/px · z∈[-290,-49]mm · 8 of 39 slices shown (2 of 4)]
[im 1/39]
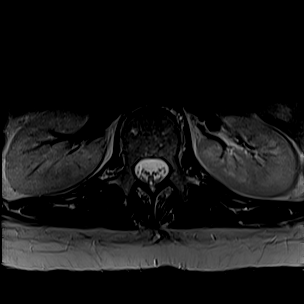
[im 6/39]
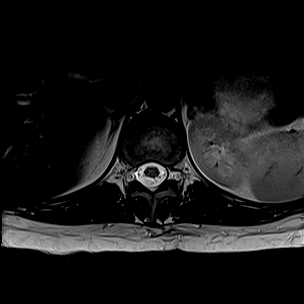
[im 11/39]
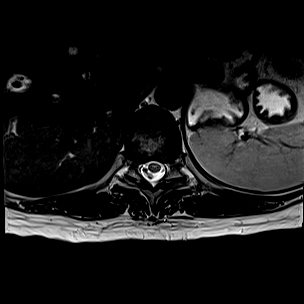
[im 17/39]
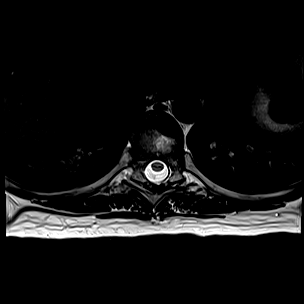
[im 22/39]
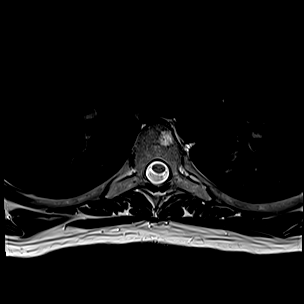
[im 28/39]
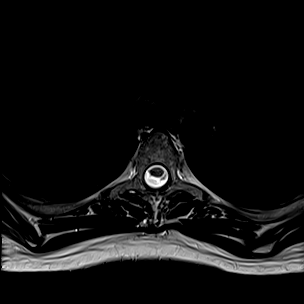
[im 33/39]
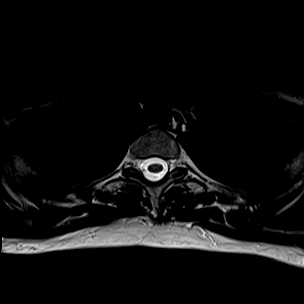
[im 39/39]
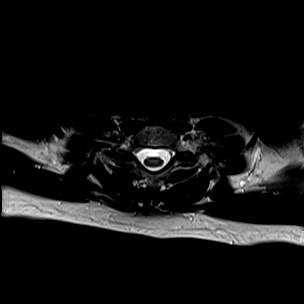

[Series 24: T2 · sagittal · 4.0mm · 0.73mm/px · 3 of 16 slices shown (3 of 4)]
[im 1/16]
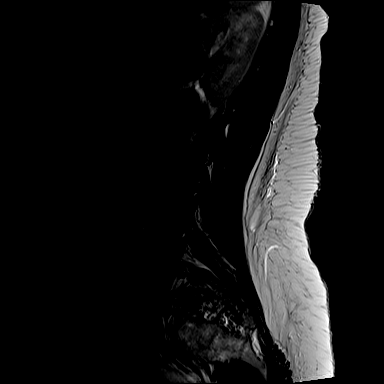
[im 8/16]
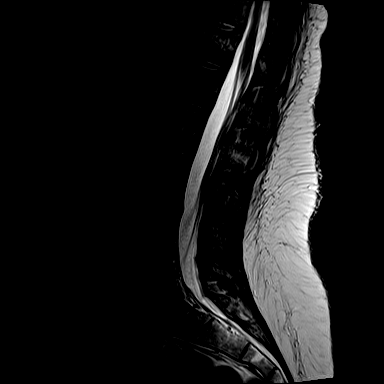
[im 16/16]
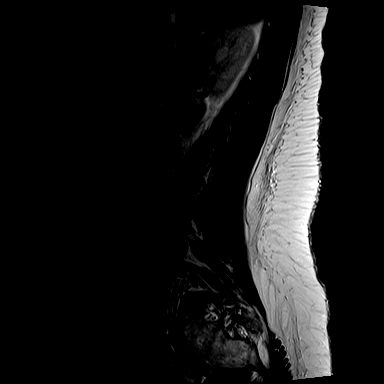

[Series 26: T1 · sagittal · 4.0mm · 0.88mm/px · 3 of 16 slices shown (3 of 4)]
[im 1/16]
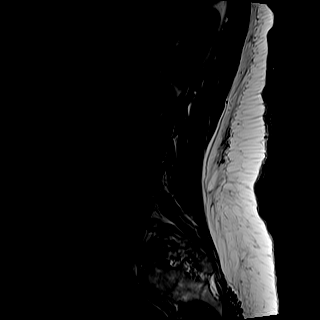
[im 8/16]
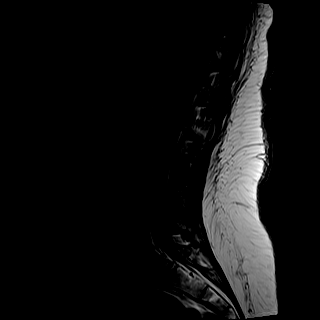
[im 16/16]
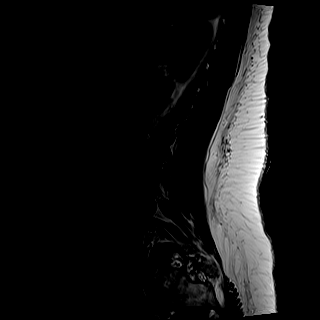

[Series 27: T2 · axial · 4.0mm · 0.57mm/px · z∈[-506,-316]mm · 6 of 30 slices shown (4 of 4)]
[im 1/30]
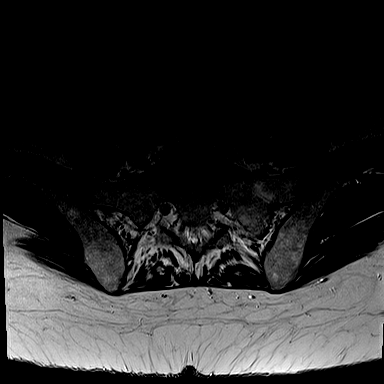
[im 6/30]
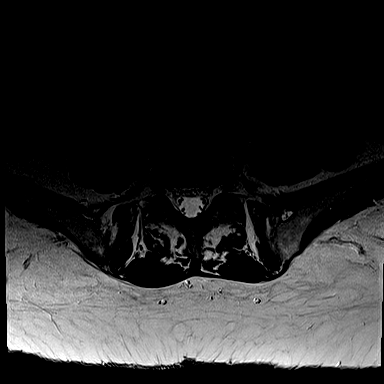
[im 12/30]
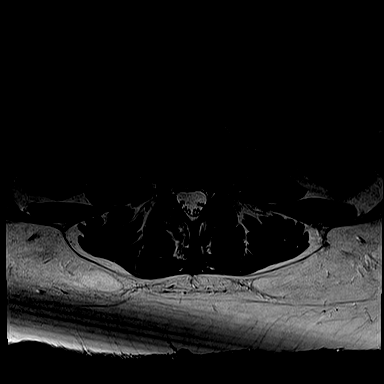
[im 18/30]
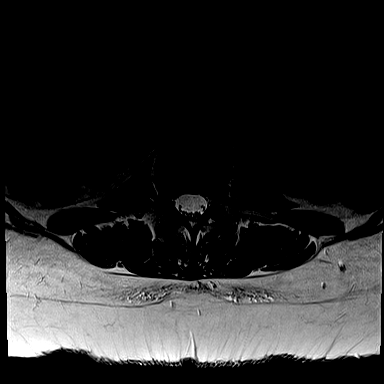
[im 24/30]
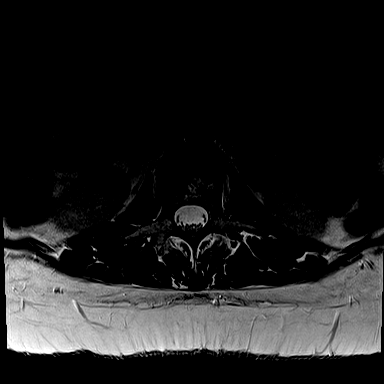
[im 30/30]
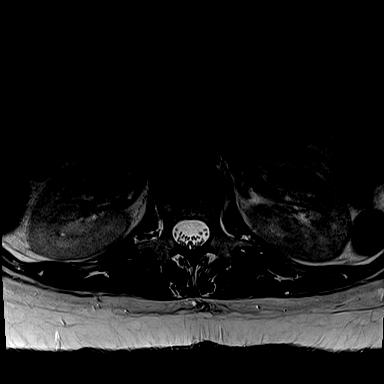

[Series 28: T1 · axial · 4.0mm · 0.34mm/px · 1 of 30 slices shown (4 of 4)]
[im 1/30]
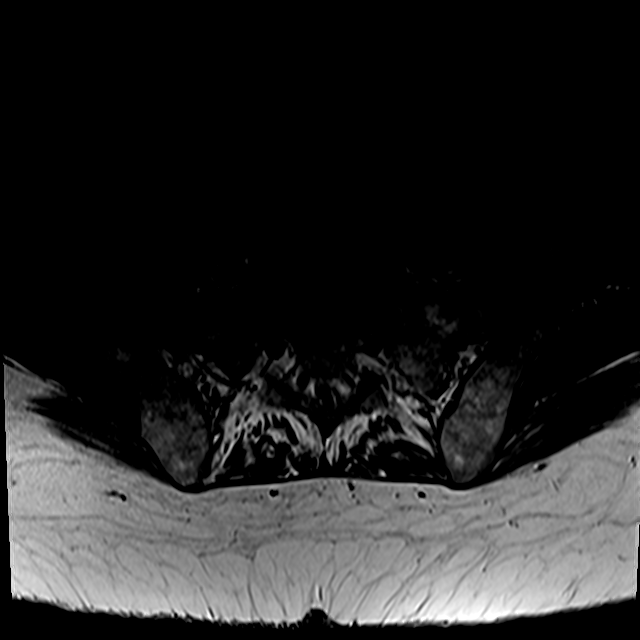

[29 of 48 positions shown; findings below may reference images not displayed]

FINDINGS: MRI THORACIC SPINE FINDINGS

Limited cervical spine imaging: Stable since 0989. Straightening and
mild reversal of cervical lordosis. No cervical spinal stenosis
suspected.

Thoracic spine segmentation: Normal. Same numbering system used on
the prior thoracic studies.

Alignment: Stable thoracic vertebral height and alignment from
T1-T11. T12 level described below.

Vertebrae: T12 superior endplate fracture with curvilinear fracture
plane surrounded by patchy marrow edema (series 21, image 11).
Compression and loss of height are maximal along the left lateral
aspect of the vertebra, up to 15% loss of height. Relatively
preserved right lateral vertebral height. Marrow edema does not
involve the pedicles or posterior elements. There is no retropulsion
of bone.

Chronic mild T8 superior endplate fracture is stable. No other
marrow edema or acute osseous abnormality.

Cord: Spinal cord signal is within normal limits at all visualized
levels. Capacious thoracic spinal canal. Normal conus at T12-L1.

Paraspinal and other soft tissues: No paraspinal hematoma. No
paraspinal soft tissue edema is evident. Negative visible thoracic
viscera. In the upper abdomen there is evidence of hepatic
periportal edema (series 22, image 29) which is new since 0989, but
perihepatic fluid identified, and the visible liver parenchyma
otherwise appears normal.

Disc levels:

Stable. No disc herniation or spinal stenosis. Mild T9-T10 facet
hypertrophy is stable with mild right T9 foraminal stenosis.

MRI LUMBAR SPINE FINDINGS

Segmentation:  Normal, concordant with the thoracic spine numbering.

Alignment:  Stable with preserved lumbar lordosis.

Vertebrae: No lumbar marrow edema or evidence of acute osseous
abnormality. Visualized lower spinal bone marrow signal is within
normal limits. Intact visible sacrum and SI joints.

Conus medullaris and cauda equina: Conus extends to the T12-L1
level. Conus and proximal cauda equina appear normal.

Paraspinal and other soft tissues: The liver is not included on
these images. Visualized abdominal viscera and paraspinal soft
tissues are within normal limits.

Disc levels:

Chronic lumbar disc degeneration at L3-L4 and L4-L5:

L3-L4: Slightly smaller right paracentral annular fissure
superimposed on mild disc bulging. No associated neural impingement.

L4-L5: Chronic broad-based small disc protrusion toward the right
lateral recess with stable mild posterior displacement of the
descending right L5 nerve roots (series 27, image 22). No spinal or
foraminal stenosis.
IMPRESSION: MR THORACIC SPINE IMPRESSION

1. Acute mild T12 superior endplate compression fracture.
Compression is most pronounced along the left lateral superior
endplate with loss of height up to 15%. No retropulsion of bone or
complicating features.
2. Otherwise stable and largely negative MRI appearance of the
thoracic spine since [DATE]. New hepatic periportal signal abnormality compatible with new
nonspecific periportal edema. Correlate with Liver Function Tests.
Right Upper Quadrant Ultrasound might be valuable.

MR LUMBAR SPINE IMPRESSION

1. Stable MRI appearance of the lumbar spine since [DATE]. Chronic L3-L4 and L4-L5 disc degeneration, most affecting the
descending right L5 nerve roots in the right lateral recess.

## 2018-07-09 IMAGING — CT CT L SPINE W/O CM
3 of 4 series · 11 of 33 positions shown, 13 images · non-contrast
Comparison: CT 12/18/2017

CLINICAL DATA: Fall with back pain

EXAM:
CT LUMBAR SPINE WITHOUT CONTRAST
TECHNIQUE: Multidetector CT imaging of the lumbar spine was performed without
intravenous contrast administration. Multiplanar CT image
reconstructions were also generated.

[Series 4: l spine soft · axial · 0.35mm/px · z∈[-235,-45]mm · 3 of 143 slices shown, 4 images]
[im 24/143  soft-tissue]
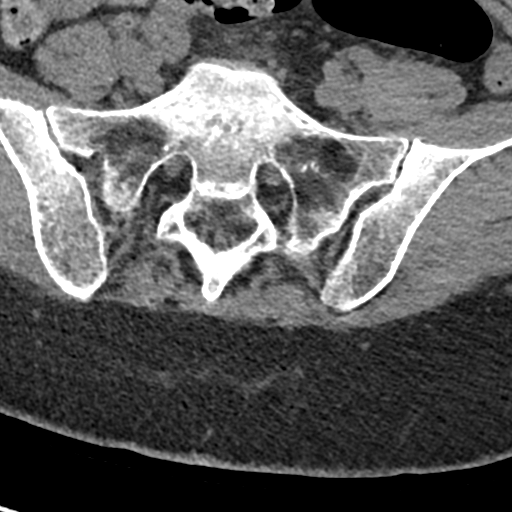
[im 24/143  bone]
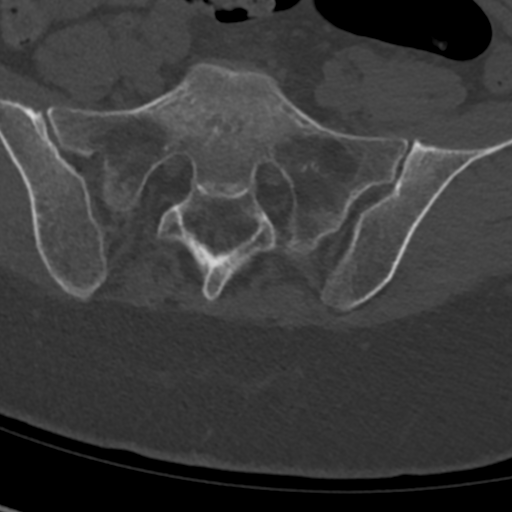
[im 72/143  bone]
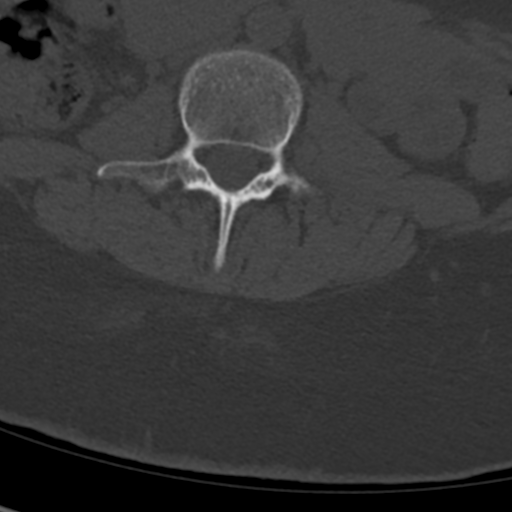
[im 119/143  bone]
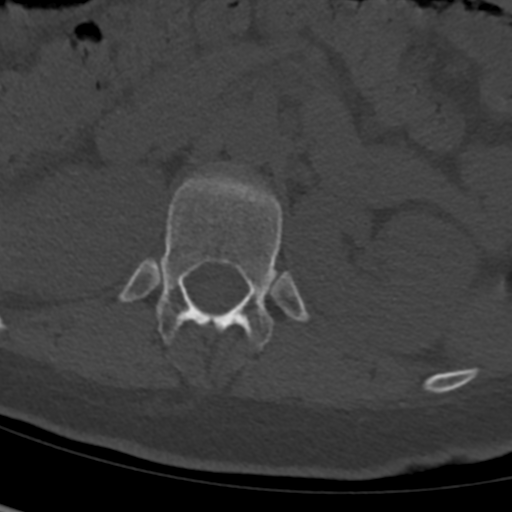

[Series 7: sagittal bone · sagittal · 0.31mm/px · 5 of 66 slices shown, 6 images]
[im 22/66  bone]
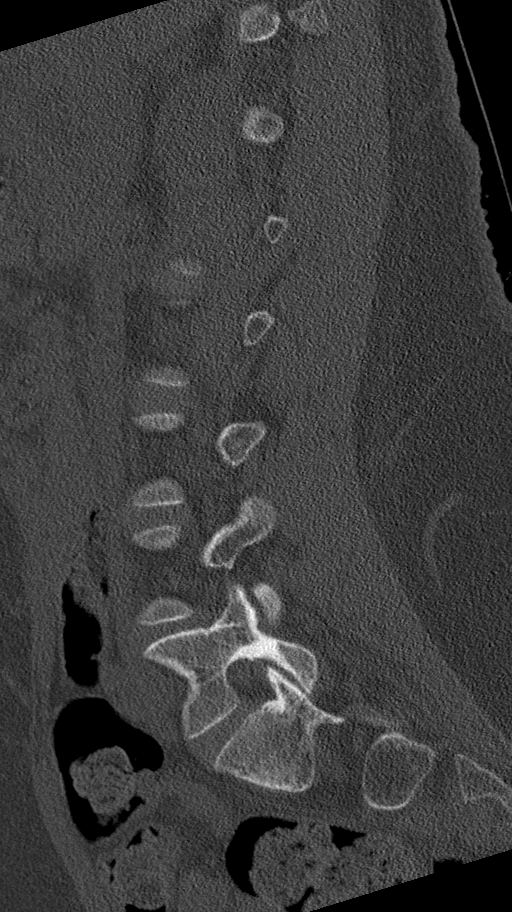
[im 28/66  bone]
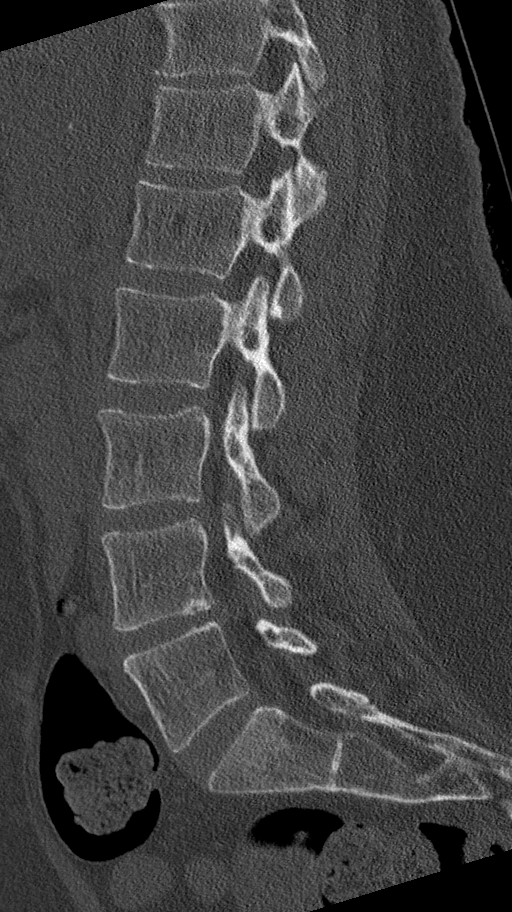
[im 33/66  soft-tissue]
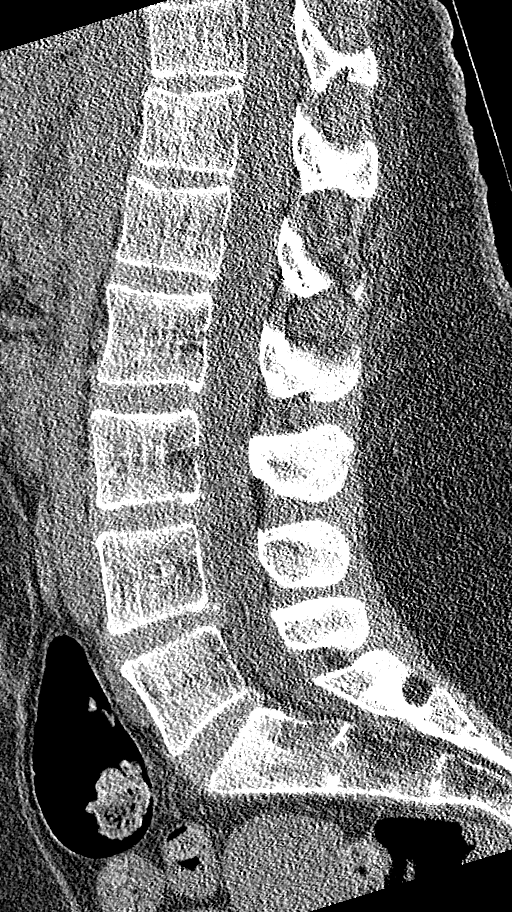
[im 33/66  bone]
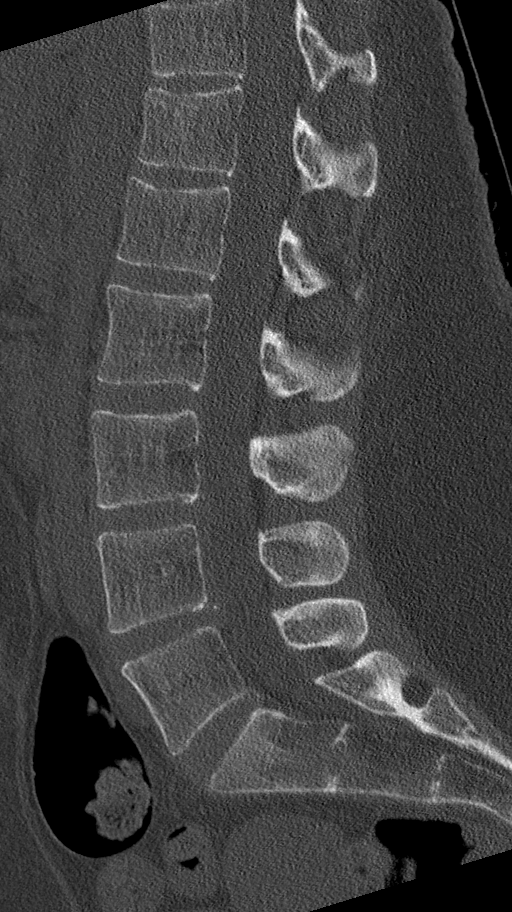
[im 38/66  bone]
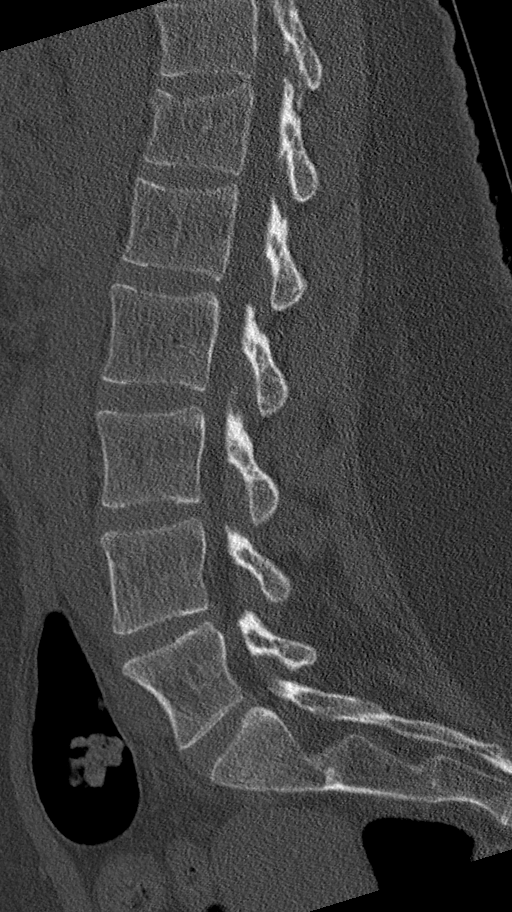
[im 44/66  bone]
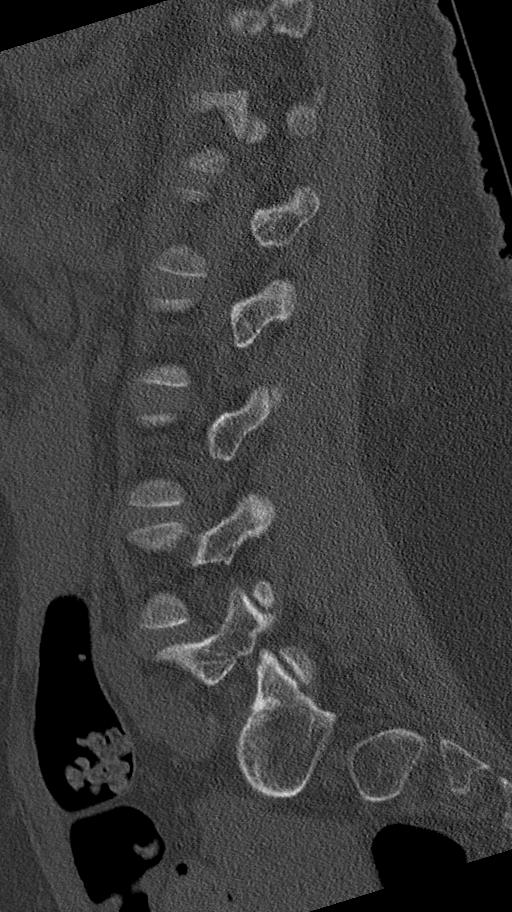

[Series 8: coronal bone · coronal · 0.30mm/px · 3 of 68 slices shown]
[im 14/68  bone]
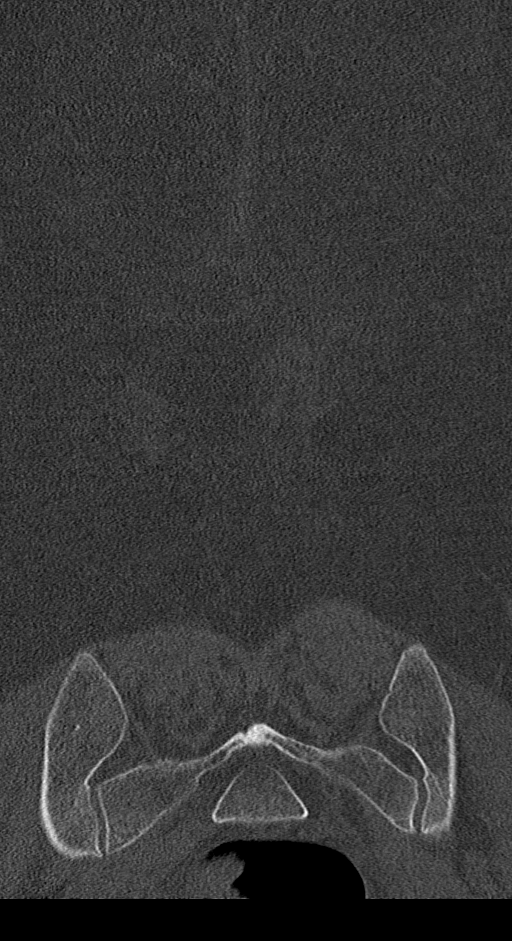
[im 27/68  bone]
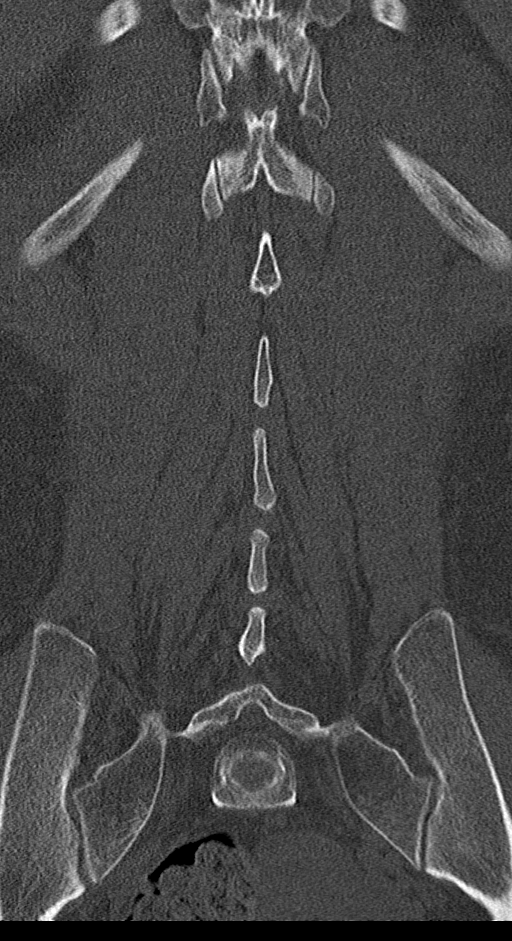
[im 41/68  bone]
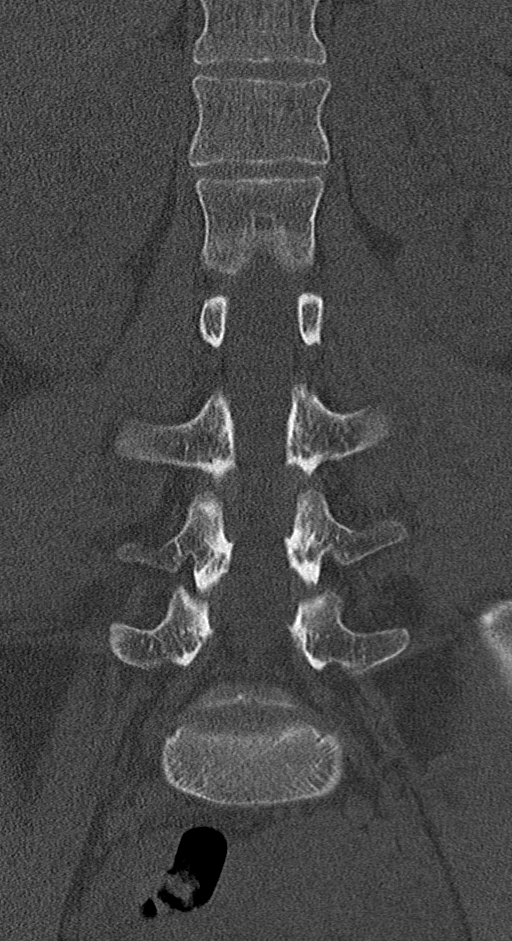

[11 of 33 positions shown; findings below may reference images not displayed]

FINDINGS: Segmentation: 5 lumbar type vertebrae.

Alignment: Normal.

Vertebrae: Acute mild superior endplate fracture at T12 with
extension of fracture lucency through the left superolateral aspect
of the vertebral body. Minimal central depression. No retropulsion.
No fracture lucency at the posterior elements.

Paraspinal and other soft tissues: Negative.

Disc levels: Mild disc bulge at L3-L4 and L4-L5. Disc bulge at L4-L5
extends into the right foramen. No significant canal stenosis. No
significant bony foraminal narrowing.
IMPRESSION: 1. Acute mild superior endplate fracture at T12 with minimal central
depression but no retropulsion of fracture fragments. No bony canal
compromise.
2. Mild disc bulges at L3-L4 and L4-L5.

## 2018-07-10 ENCOUNTER — Encounter (HOSPITAL_COMMUNITY): Payer: Self-pay

## 2018-07-10 ENCOUNTER — Emergency Department (HOSPITAL_COMMUNITY)
Admission: EM | Admit: 2018-07-10 | Discharge: 2018-07-10 | Disposition: A | Discharge status: Home / Self Care | Payer: Medicaid Other | Attending: Emergency Medicine | Admitting: Emergency Medicine

## 2018-07-10 DIAGNOSIS — M546 Pain in thoracic spine: Secondary | ICD-10-CM | POA: Diagnosis not present

## 2018-07-10 DIAGNOSIS — Z91040 Latex allergy status: Secondary | ICD-10-CM | POA: Diagnosis not present

## 2018-07-10 DIAGNOSIS — F909 Attention-deficit hyperactivity disorder, unspecified type: Secondary | ICD-10-CM | POA: Diagnosis not present

## 2018-07-10 DIAGNOSIS — Z79899 Other long term (current) drug therapy: Secondary | ICD-10-CM | POA: Diagnosis not present

## 2018-07-10 DIAGNOSIS — F17210 Nicotine dependence, cigarettes, uncomplicated: Secondary | ICD-10-CM | POA: Insufficient documentation

## 2018-07-10 DIAGNOSIS — R55 Syncope and collapse: Secondary | ICD-10-CM | POA: Diagnosis not present

## 2018-07-10 LAB — CBC WITH DIFFERENTIAL/PLATELET
ABS IMMATURE GRANULOCYTES: 0 10*3/uL (ref 0.0–0.1)
BASOS PCT: 1 %
Basophils Absolute: 0 10*3/uL (ref 0.0–0.1)
EOS ABS: 0.1 10*3/uL (ref 0.0–0.7)
Eosinophils Relative: 1 %
HEMATOCRIT: 40.7 % (ref 36.0–46.0)
Hemoglobin: 13.3 g/dL (ref 12.0–15.0)
Immature Granulocytes: 0 %
LYMPHS ABS: 2.3 10*3/uL (ref 0.7–4.0)
Lymphocytes Relative: 44 %
MCH: 31.8 pg (ref 26.0–34.0)
MCHC: 32.7 g/dL (ref 30.0–36.0)
MCV: 97.4 fL (ref 78.0–100.0)
MONO ABS: 0.3 10*3/uL (ref 0.1–1.0)
MONOS PCT: 7 %
NEUTROS ABS: 2.5 10*3/uL (ref 1.7–7.7)
Neutrophils Relative %: 47 %
PLATELETS: 210 10*3/uL (ref 150–400)
RBC: 4.18 MIL/uL (ref 3.87–5.11)
RDW: 11.7 % (ref 11.5–15.5)
WBC: 5.2 10*3/uL (ref 4.0–10.5)

## 2018-07-10 LAB — TYPE AND SCREEN
ABO/RH(D): A POS
Antibody Screen: NEGATIVE

## 2018-07-10 LAB — COMPREHENSIVE METABOLIC PANEL
ALBUMIN: 4.4 g/dL (ref 3.5–5.0)
ALK PHOS: 83 U/L (ref 38–126)
ALT: 30 U/L (ref 0–44)
AST: 27 U/L (ref 15–41)
Anion gap: 9 (ref 5–15)
BUN: 12 mg/dL (ref 6–20)
CALCIUM: 9.5 mg/dL (ref 8.9–10.3)
CO2: 24 mmol/L (ref 22–32)
CREATININE: 0.7 mg/dL (ref 0.44–1.00)
Chloride: 105 mmol/L (ref 98–111)
GFR calc Af Amer: >60 mL/min (ref >60)
GFR calc non Af Amer: >60 mL/min (ref >60)
GLUCOSE: 94 mg/dL (ref 70–99)
Potassium: 3.7 mmol/L (ref 3.5–5.1)
SODIUM: 138 mmol/L (ref 135–145)
Total Bilirubin: 1.3 mg/dL — ABNORMAL HIGH (ref 0.3–1.2)
Total Protein: 7.3 g/dL (ref 6.5–8.1)

## 2018-07-10 LAB — POC URINE PREG, ED: Preg Test, Ur: NEGATIVE

## 2018-07-10 LAB — ABO/RH: ABO/RH(D): A POS

## 2018-07-10 MED ORDER — OXYCODONE-ACETAMINOPHEN 5-325 MG PO TABS
2.0000 | ORAL_TABLET | Freq: Once | ORAL | Status: AC
  Administered 2018-07-10: 2 via ORAL
  Filled 2018-07-10: qty 2

## 2018-07-10 MED ORDER — SODIUM CHLORIDE 0.9 % IV BOLUS
1000.0000 mL | Freq: Once | INTRAVENOUS | Status: AC
  Administered 2018-07-10: 1000 mL via INTRAVENOUS

## 2018-07-10 MED ORDER — CYCLOBENZAPRINE HCL 10 MG PO TABS: 10.0000 mg | ORAL_TABLET | Freq: Two times a day (BID) | ORAL | 0 refills | Status: DC | PRN

## 2018-07-10 NOTE — ED Notes (Signed)
Reports was at [facility] getting films done - subsequently had syncopal episode - presently caox4; tearful, states h/o anemia - on iron; however, doesn't absorb it well as she has had gastric bypass - additionally reports blood in stool which has subsided at present; also h/o syncope - none recent prior to today

## 2018-07-10 NOTE — ED Provider Notes (Signed)
[facility] EMERGENCY DEPARTMENT Provider Note   CSN: [id number] Arrival date & time: 07/10/18  1546     History   Chief Complaint Chief Complaint  Patient presents with  . Back Pain  . Loss of Consciousness    HPI Penny [name] is a 32 y.o. female.  32yo F w/ PMH including gastric bypass complicated by anemia, anxiety/depression, previous opiate abuse, fibromyalgia, migraines, pseudoseizures who presents with syncope.  The patient broke a bone in her back in June and has been following with orthopedics.  She had a clinic appointment today and while getting back x-rays she reportedly had a syncopal episode.  The patient does not recall details of the event but it was witnessed by her friend.  She states that this has happened before; last week she had a "blackout" episode.  She has a history of anemia ever since her gastric bypass surgery and has had blood transfusions in the distant past. She states she takes iron supplement.  The history is provided by the patient.  Back Pain    Loss of Consciousness   Associated symptoms include back pain.    Past Medical History:  Diagnosis Date  . ADD (attention deficit disorder)   . Anemia 2011   2o to GASTRIC BYPASS  . Anginal pain ([place])   . Anxiety   . Blood transfusion without reported diagnosis   . Chronic daily headache   . Depression   . Depression   . Dysmenorrhea 09/18/2013  . Dysrhythmia   . Elevated liver enzymes JUL 2011ALK PHOS 111-127 AST  143-267 ALT  213-321T BILI 0.6  ALB  3.7-4.06 Jun 2011 ALK PHOS 118 AST 24 ALT 42 T BILI 0.4 ALB 3.9  . Encounter for drug rehabilitation    at behavioral health for opioid addiction about 4 years ago  . Family history of adverse reaction to anesthesia    'dad had to be kept on pump for breathing for morphine'  . Fatty liver   . Fibroid 01/18/2017  . Fibromyalgia   . Gastric bypass status for obesity   . Gastritis JULY 2011  . Heart murmur   . Hereditary  and idiopathic peripheral neuropathy 01/15/2015  . History of Holter monitoring   . Hx of opioid abuse    for about 4 years, about 4 years ago  . Interstitial cystitis   . Iron deficiency anemia 07/23/2010  . Irritable bowel syndrome 2012 DIARRHEA   JUN 2012 TTG IgA 14.9  . IUD FEB 2010  . Lupus ([place])   . Menorrhagia 07/18/2013  . Migraines   . Obesity (BMI 30-39.9) 2011 228 LBS BMI 36.8  . Ovarian cyst   . Patient desires pregnancy 09/18/2013  . Polyneuropathy   . PONV (postoperative nausea and vomiting) seizure post-operatively  . Potassium (K) deficiency   . Pregnant 12/25/2013  . Psychiatric pseudoseizure   . RLQ abdominal pain 07/18/2013  . Sciatica of left side 11/14/2014  . 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 Stress 09/18/2013    Patient Active Problem List   Diagnosis Date Noted  . Herpes genitalis in women 12/19/2017  . Abnormal urine odor 03/30/2017  . Urinary tract infection with hematuria 03/30/2017  . Screening for genitourinary condition 03/30/2017  . Pain 03/30/2017  . Burning with urination 03/30/2017  . Family history of breast cancer in mother,uncertain BR CA status 02/28/2017  . Fibroid 01/18/2017  . Chronic pain syndrome 12/05/2016  . Folate deficiency  12/04/2016  . Nausea vomiting and diarrhea 12/04/2016  . Severe anemia 12/04/2016  . Symptomatic anemia 12/03/2016  . Complaints of leg weakness   . Paresthesia   . Left-sided weakness   . Uncontrolled pain 01/24/2016  . Malnutrition of moderate degree 01/24/2016  . Neuropathy 01/23/2016  . Inability to walk 01/23/2016  . Paresthesias   . Bilateral leg numbness 11/26/2015  . 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 Post partum depression 05/09/2015  . Anastomotic ulcer   . Dysphagia   . Hematemesis 04/07/2015  . Intractable vomiting with nausea 04/07/2015  . Elevated liver enzymes   . Epigastric pain   . Pseudoseizure ([place]) 02/22/2015   . Seizure-like activity ([place]) 02/22/2015  . Fibromyalgia 02/18/2015  . Vulvar fissure 02/18/2015  . Clinical depression 02/01/2015  . Current smoker 01/29/2015  . Current tobacco use 01/29/2015  . 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 Leg weakness, bilateral 01/02/2015  . Gait disorder 01/02/2015  . Other symptoms and signs involving the musculoskeletal system 01/02/2015  . Abnormal gait 01/02/2015  . Cervicalgia 12/18/2014  . Sciatica of left side 11/14/2014  . Neuralgia neuritis, sciatic nerve 11/14/2014  . 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 Encounter for sterilization 09/09/2014  . Abdominal pain, acute, right lower quadrant 08/22/2014  . Headache, migraine 08/19/2014  . Seizure ([place]) 08/18/2014  . Encephalopathy 08/13/2014  . Headache 08/13/2014  . Altered mental status 08/13/2014  . Right leg numbness 07/31/2014  . Disturbance of skin sensation 07/31/2014  . Hypertension in pregnancy, transient 07/08/2014  . Previous gastric bypass affecting pregnancy, antepartum 05/22/2014  . 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 ASD (atrial septal defect), ostium secundum 05/17/2014  . Depression complicating pregnancy in second trimester, antepartum 05/09/2014  . Antepartum mental disorder in pregnancy 05/09/2014  . Rapid palpitations 02/18/2014  . H/O maternal third degree perineal laceration, currently pregnant 02/18/2014  . High-risk pregnancy 02/18/2014  . Supervision of pregnancy with other poor reproductive or obstetric history, unspecified trimester 02/18/2014  . Restless legs 01/16/2014  . Restless leg 01/16/2014  . Chronic interstitial cystitis 10/23/2013  . Menorrhagia 07/18/2013  . Excessive and frequent menstruation 07/18/2013  . h/o Opiate addiction 03/11/2012    Class: Acute  . History of migraine headaches 03/10/2012    Class: Acute  . Depression with anxiety 03/10/2012    Class: Chronic  . Panic disorder without agoraphobia  with moderate panic attacks 03/10/2012    Class: Chronic  . 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 Panic disorder without agoraphobia 03/10/2012  . H/O disease 03/10/2012  . Dysthymia 03/10/2012  . Pelvic congestion syndrome 10/13/2011  . Coitalgia 10/13/2011  . Chronic migraine without aura 10/13/2011  . Unspecified dyspareunia 10/13/2011  . IBS (irritable bowel syndrome) 08/25/2011  . Diarrhea 05/27/2011  . OBESITY, UNSPECIFIED 09/17/2010  . Transaminitis 09/17/2010  . Anemia, iron deficiency 07/23/2010    Past Surgical History:  Procedure Laterality Date  . BIOPSY  04/10/2015   Procedure: BIOPSY;  Surgeon: Daneil Dolin, MD;  Location: [facility];  Service: Endoscopy;;  . cath self every nite     for sodium bicarb injection (discontinued 2013)  . CHOLECYSTECTOMY  2005   biliary dyskinesia  . COLONOSCOPY  JUN 2012 ABD PN/DIARRHEA WITH PROPOFOL   NL COLON  . DILATION AND CURETTAGE OF UTERUS    . DILITATION & CURRETTAGE/HYSTROSCOPY WITH NOVASURE ABLATION N/A 03/24/2017  Procedure: DILATATION & CURETTAGE/HYSTEROSCOPY WITH NOVASURE ENDOMETRIAL ABLATION;  Surgeon: Jonnie Kind, MD;  Location: [facility];  Service: Gynecology;  Laterality: N/A;  . ESOPHAGEAL DILATION N/A 04/10/2015   Procedure: ESOPHAGEAL DILATION WITH 54FR MALONEY DILATOR;  Surgeon: Daneil Dolin, MD;  Location: [facility];  Service: Endoscopy;  Laterality: N/A;  . ESOPHAGOGASTRODUODENOSCOPY    . ESOPHAGOGASTRODUODENOSCOPY (EGD) WITH PROPOFOL N/A 04/10/2015   Procedure: ESOPHAGOGASTRODUODENOSCOPY (EGD) WITH PROPOFOL;  Surgeon: Daneil Dolin, MD;  Location: [facility];  Service: Endoscopy;  Laterality: N/A;  . ESOPHAGOGASTRODUODENOSCOPY (EGD) WITH PROPOFOL N/A 12/06/2016   Procedure: ESOPHAGOGASTRODUODENOSCOPY (EGD) WITH PROPOFOL;  Surgeon: Danie Binder, MD;  Location: [facility];  Service: Endoscopy;  Laterality: N/A;  . GAB  2007   in [place]-POUCH 5 CM  . GASTRIC BYPASS   06/2006  . HYSTEROSCOPY W/D&C N/A 09/12/2014   Procedure: DILATATION AND CURETTAGE /HYSTEROSCOPY;  Surgeon: Jonnie Kind, MD;  Location: [facility];  Service: Gynecology;  Laterality: N/A;  . LAPAROSCOPIC TUBAL LIGATION Bilateral 03/24/2017   Procedure: LAPAROSCOPIC TUBAL LIGATION ([place] Rings);  Surgeon: Jonnie Kind, MD;  Location: [facility];  Service: Gynecology;  Laterality: Bilateral;  . REPAIR VAGINAL CUFF N/A 07/30/2014   Procedure: REPAIR VAGINAL CUFF;  Surgeon: Mora Bellman, MD;  Location: [facility];  Service: Gynecology;  Laterality: N/A;  . SAVORY DILATION  06/20/2012   Dr. Barnie Alderman gastritis/Ulcer in the mid jejunum. Empiric dilation.   . SURAL NERVE BX Left 02/25/2016   Procedure: LEFT SURAL NERVE BIOPSY;  Surgeon: Jovita Gamma, MD;  Location: [facility];  Service: Neurosurgery;  Laterality: Left;  Left sural nerve biopsy  . TONSILLECTOMY    . TONSILLECTOMY AND ADENOIDECTOMY    . UPPER GASTROINTESTINAL ENDOSCOPY  JULY 2011 NAUSEA-D125,V6, PH 25   Bx; GASTRITIS, POUCH-5 CM LONG  . WISDOM TOOTH EXTRACTION       OB History    Gravida  3   Para  2   Term  1   Preterm  1   AB  1   Living  2     SAB  1   TAB      Ectopic      Multiple      Live Births  2            Home Medications    Prior to Admission medications   Medication Sig Start Date End Date Taking? Authorizing Provider  ADDERALL XR 30 MG 24 hr capsule TAKE (1) CAPSULE BY MOUTH EVERY DAY. 07/07/18  Yes Cloria Spring, MD  carbamazepine (TEGRETOL) 200 MG tablet Take two tablets at bedtime Patient taking differently: Take 400 mg by mouth at bedtime.  05/08/18  Yes Cloria Spring, MD  cariprazine [facility]) capsule Take 1 capsule (1.5 mg total) by mouth daily. 05/08/18  Yes Cloria Spring, MD  clonazePAM (KLONOPIN) 0.5 MG tablet Take 1 tablet (0.5 mg total) by mouth 3 (three) times daily as needed for anxiety. 05/08/18 05/08/19 Yes Cloria Spring, MD  cloNIDine (CATAPRES) 0.1 MG tablet Take 1 tablet  (0.1 mg total) by mouth at bedtime. Patient taking differently: Take 0.1 mg by mouth 3 times/day as needed-between meals & bedtime (for headache).  05/08/18  Yes Cloria Spring, MD  DULoxetine (CYMBALTA) 60 MG capsule Take 2 capsules (120 mg total) by mouth at bedtime. 05/08/18  Yes Cloria Spring, MD  HYDROcodone Bitartrate ER [facility]) 30 MG T24A Take 30 mg by mouth at bedtime.  Yes [provider]  Oxycodone HCl 10 MG TABS Take 10 mg by mouth every 6 (six) hours as needed.   Yes [provider]  pantoprazole (PROTONIX) 40 MG tablet Take 1 tablet (40 mg total) by mouth daily. 11/10/17  Yes Setzer, Rona Ravens, NP  PROAIR HFA 108 (90 Base) MCG/ACT inhaler INHALE 2 PUFFS INTO THE LUNGS EVERY 6 HOURS AS NEEDED. 06/14/17  Yes Luking, Elayne Snare, MD  promethazine (PHENERGAN) 25 MG tablet TAKE ONE TABLET BY MOUTH EVERY 6 HOURS AS NEEDED. 07/03/18  Yes Estill Dooms, NP  topiramate (TOPAMAX) 50 MG tablet Take one in the am and 2 at night Patient taking differently: Take 25-50 mg by mouth See admin instructions. Take one tablet in the am and take 2 tablets at night 05/08/18  Yes Cloria Spring, MD  cyclobenzaprine (FLEXERIL) 10 MG tablet Take 1 tablet (10 mg total) by mouth 2 (two) times daily as needed for muscle spasms. 07/10/18   Jamina Macbeth, Wenda Overland, MD    Family History Family History  Problem Relation Age of Onset  . Hemochromatosis Maternal Grandmother   . Migraines Maternal Grandmother   . Cancer Maternal Grandmother   . Breast cancer Maternal Grandmother   . Hypertension Father   . Diabetes Father   . Coronary artery disease Father   . Migraines Paternal Grandmother   . Breast cancer Paternal Grandmother   . Cancer Mother        breast  . Hemochromatosis Mother   . Breast cancer Mother   . Depression Mother   . Anxiety disorder Mother   . Coronary artery disease Paternal Grandfather   . Anxiety disorder Brother   . Bipolar disorder Brother   . Healthy Daughter     . Healthy Son     Social History Social History   Tobacco Use  . Smoking status: Current Every Day Smoker    Packs/day: 0.25    Years: 6.00    Pack years: 1.50    Types: E-cigarettes  . Smokeless tobacco: Never Used  Substance Use Topics  . Alcohol use: Yes    Alcohol/week: 0.0 oz    Comment: occasionally  . Drug use: Not Currently    Types: Marijuana    Comment: Previously opioid addiction went through rehab     Allergies   Gabapentin; Metoclopramide hcl; Tramadol; Nucynta [tapentadol]; Propofol; Trazodone and nefazodone; Zofran [ondansetron]; Ativan [lorazepam]; Butrans [buprenorphine]; Toradol [ketorolac tromethamine]; Latex; Lyrica [pregabalin]; and Tape   Review of Systems Review of Systems  Cardiovascular: Positive for syncope.  Musculoskeletal: Positive for back pain.   All other systems reviewed and are negative except that which was mentioned in HPI   Physical Exam Updated Vital Signs BP (!) 100/58   Pulse (!) 58   Temp 98.4 F (36.9 C) (Oral)   Resp 16   Ht '5\' 7"'$  (1.702 m)   Wt 79.4 kg (175 lb)   SpO2 98%   BMI 27.41 kg/m   Physical Exam  Constitutional: She is oriented to person, place, and time. She appears well-developed and well-nourished. No distress.  HENT:  Head: Normocephalic and atraumatic.  Moist mucous membranes  Eyes: Pupils are equal, round, and reactive to light. Conjunctivae are normal.  Neck: Neck supple.  Cardiovascular: Normal rate, regular rhythm and normal heart sounds.  No murmur heard. Pulmonary/Chest: Effort normal and breath sounds normal.  Abdominal: Soft. Bowel sounds are normal. She exhibits no distension. There is no tenderness.  Musculoskeletal: She exhibits no  edema.  Neurological: She is alert and oriented to person, place, and time.  Fluent speech  Skin: Skin is warm and dry. There is pallor.  Psychiatric:  Anxious, tearful  Nursing note and vitals reviewed.    ED Treatments / Results  Labs (all labs  ordered are listed, but only abnormal results are displayed) Labs Reviewed  COMPREHENSIVE METABOLIC PANEL - Abnormal; Notable for the following components:      Result Value   Total Bilirubin 1.3 (*)    All other components within normal limits  CBC WITH DIFFERENTIAL/PLATELET  POC URINE PREG, ED  TYPE AND SCREEN  ABO/RH    EKG EKG Interpretation  Date/Time:  Monday July 10 2018 16:38:21 EDT Ventricular Rate:  54 PR Interval:    QRS Duration: 130 QT Interval:  426 QTC Calculation: 404 R Axis:   140 Text Interpretation:  Sinus rhythm Probable left atrial enlargement IVCD, consider atypical RBBB since previous tracing, now bradycardic Confirmed by Theotis Burrow [phone]) on 07/10/2018 7:32:29 PM   Radiology No results found.  Procedures Procedures (including critical care time)  Medications Ordered in ED Medications  oxyCODONE-acetaminophen (PERCOCET/ROXICET) 5-325 MG per tablet 2 tablet (2 tablets Oral Given 07/10/18 1831)  sodium chloride 0.9 % bolus 1,000 mL (0 mLs Intravenous Stopped 07/10/18 2104)  sodium chloride 0.9 % bolus 1,000 mL (0 mLs Intravenous Stopped 07/10/18 2104)  oxyCODONE-acetaminophen (PERCOCET/ROXICET) 5-325 MG per tablet 2 tablet (2 tablets Oral Given 07/10/18 2302)     Initial Impression / Assessment and Plan / ED Course  I have reviewed the triage vital signs and the nursing notes.  Pertinent labs that were available during my care of the patient were reviewed by me and considered in my medical decision making (see chart for details).     VS stable on arrival. EKG shows bradycardia at rate 54, not entirely abnormal given her young age. Her labwork today is unremarkable including normal CBC and CMP, negative UPT. She doesn't appear to be dehydrated. I reviewed her medication list which includes oxycodone, klonopin, clonidine, and tizanidine. I explained that all these medications are sedating and must be used with extreme caution as they could lead to  respiratory depression and perhaps contribute to lightheadedness/syncope. PT has been evaluated by [place] in the past with Holter monitor, instructed her to f/u with their clinic for consideration of further w/u if she continues to have syncopal or near syncopal episodes. She gives no other CP/SOB symptoms to suggest syncope is due to PE. She requested flexeril instead of tizanidine as she thinks this medication is causing side effects; instructed her to throw away this medication and use flexeril with caution with other meds. Reviewed return precautions.  Final Clinical Impressions(s) / ED Diagnoses   Final diagnoses:  Syncope, unspecified syncope type  Thoracic back pain, unspecified back pain laterality, unspecified chronicity    ED Discharge Orders        Ordered    cyclobenzaprine (FLEXERIL) 10 MG tablet  2 times daily PRN     07/10/18 2254       Ernest Popowski, Wenda Overland, MD 07/11/18 1504

## 2018-07-10 NOTE — ED Notes (Signed)
Report given to oncoming RN.

## 2018-07-10 NOTE — ED Triage Notes (Signed)
[facility]- pt coming from orthopedic office. She had a syncopal episode during XR of her back. She has existing T12 fracture from a fall. Initial BP was 60 palpated. Improved to 616 systolic with EMS. Pt alert and oriented.

## 2018-07-10 NOTE — ED Notes (Signed)
Pt unable to provide urine specimen at this time

## 2018-07-11 ENCOUNTER — Telehealth: Payer: Self-pay | Admitting: Family Medicine

## 2018-07-11 NOTE — Telephone Encounter (Signed)
appt scheduled 8/8

## 2018-07-11 NOTE — Telephone Encounter (Signed)
Recommend an office visit this week (Doing a medical clearance requires being seen)

## 2018-07-11 NOTE — Telephone Encounter (Signed)
Patient is supposed Kyphoplasty surgery at the end of August.  She had to go to the ER last night for syncope episode and Dr. Rolena Infante will not do her surgery until she is cleared by PCP.  She wants to know if Dr. Nicki Reaper can review notes from ER last night and clear her.

## 2018-07-11 NOTE — Telephone Encounter (Signed)
Please call pt and schedule office visit this week with dr Nicki Reaper for surgical clearance

## 2018-07-13 ENCOUNTER — Encounter: Payer: Self-pay | Admitting: Family Medicine

## 2018-07-13 ENCOUNTER — Ambulatory Visit: Payer: Medicaid Other | Admitting: Family Medicine

## 2018-07-13 VITALS — BP 128/84 | Temp 97.8°F | Ht 67.0 in | Wt 166.0 lb

## 2018-07-13 DIAGNOSIS — M5442 Lumbago with sciatica, left side: Secondary | ICD-10-CM | POA: Diagnosis not present

## 2018-07-13 DIAGNOSIS — M5412 Radiculopathy, cervical region: Secondary | ICD-10-CM | POA: Diagnosis not present

## 2018-07-13 DIAGNOSIS — G8929 Other chronic pain: Secondary | ICD-10-CM | POA: Diagnosis not present

## 2018-07-13 DIAGNOSIS — M5441 Lumbago with sciatica, right side: Secondary | ICD-10-CM | POA: Diagnosis not present

## 2018-07-13 DIAGNOSIS — Q2112 Patent foramen ovale: Secondary | ICD-10-CM

## 2018-07-13 DIAGNOSIS — Z79899 Other long term (current) drug therapy: Secondary | ICD-10-CM | POA: Diagnosis not present

## 2018-07-13 DIAGNOSIS — Q211 Atrial septal defect: Secondary | ICD-10-CM | POA: Diagnosis not present

## 2018-07-13 DIAGNOSIS — R55 Syncope and collapse: Secondary | ICD-10-CM

## 2018-07-13 NOTE — Progress Notes (Signed)
   Subjective:    Patient ID: Penny [name], female    DOB: 1986/03/11, 32 y.o.   MRN: [id number]  HPI Pt here today to get surgical clearance. Pt is having surgery on 08/02/18. Pt was in ER on Monday and ER doctor is concerned about patient  Heart. Pt was at doctor office Monday and was transported to [facility] via EMS due to BP and pulse dropping.  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  HENT: Negative for congestion and rhinorrhea.   Respiratory: Negative for cough and shortness of breath.   Cardiovascular: Negative for chest pain and leg swelling.  Gastrointestinal: Negative for abdominal pain and diarrhea.  Endocrine: Negative for polydipsia and polyphagia.  Skin: Negative for color change.  Neurological: Negative for dizziness and weakness.  Psychiatric/Behavioral: Negative for behavioral problems and confusion.       Objective:   Physical Exam  Constitutional: She appears well-nourished. No distress.  HENT:  Head: Normocephalic and atraumatic.  Eyes: Right eye exhibits no discharge. Left eye exhibits no discharge.  Neck: No tracheal deviation present.  Cardiovascular: Normal rate, regular rhythm and normal heart sounds.  No murmur heard. Pulmonary/Chest: Effort normal and breath sounds normal. No respiratory distress.    Musculoskeletal: She exhibits no edema.  Lymphadenopathy:    She has no cervical adenopathy.  Neurological: She is alert. Coordination normal.  Skin: Skin is warm and dry.  Psychiatric: She has a normal mood and affect. Her behavior is normal.  Vitals reviewed. Blood pressure 106/60 sitting 100/62 standing  She does have a history of PFO but this is not causing any of her symptoms      Assessment & Plan:  PFO-this is not causing any problems with the patient currently we will be doing an echo with a consultation with cardiology in September but this does not prevent her from having her kyphoplasty  Her blood pressure is on the low end of normal she is been encouraged to eat and drink appropriately she is also been advised to hold off on any clonidine It is quite possible clonidine because her blood pressure drop as well as heart rate to drop she was instructed as she keeps having troubles or having progressive troubles she will need follow-up here plus also additional work-up  She is cleared surgically for kyphoplasty she should be able to do this without problems The patient does have a history of falls as well as pseudoseizures but I do not feel that these will preclude her ability to have her surgery  Patient is motivated to have the surgery completed and to get better

## 2018-07-18 ENCOUNTER — Encounter: Payer: Self-pay | Admitting: Family Medicine

## 2018-07-23 IMAGING — DX DG TIBIA/FIBULA 2V*L*
4 series · 4 of 4 positions shown · non-contrast
Comparison: None.

CLINICAL DATA: Left ankle pain following a fall 4 days ago and
landed on her left knee on concrete. Now with ankle pain.

EXAM:
LEFT TIBIA AND FIBULA - 2 VIEW

[tibia ap (1 of 2)]
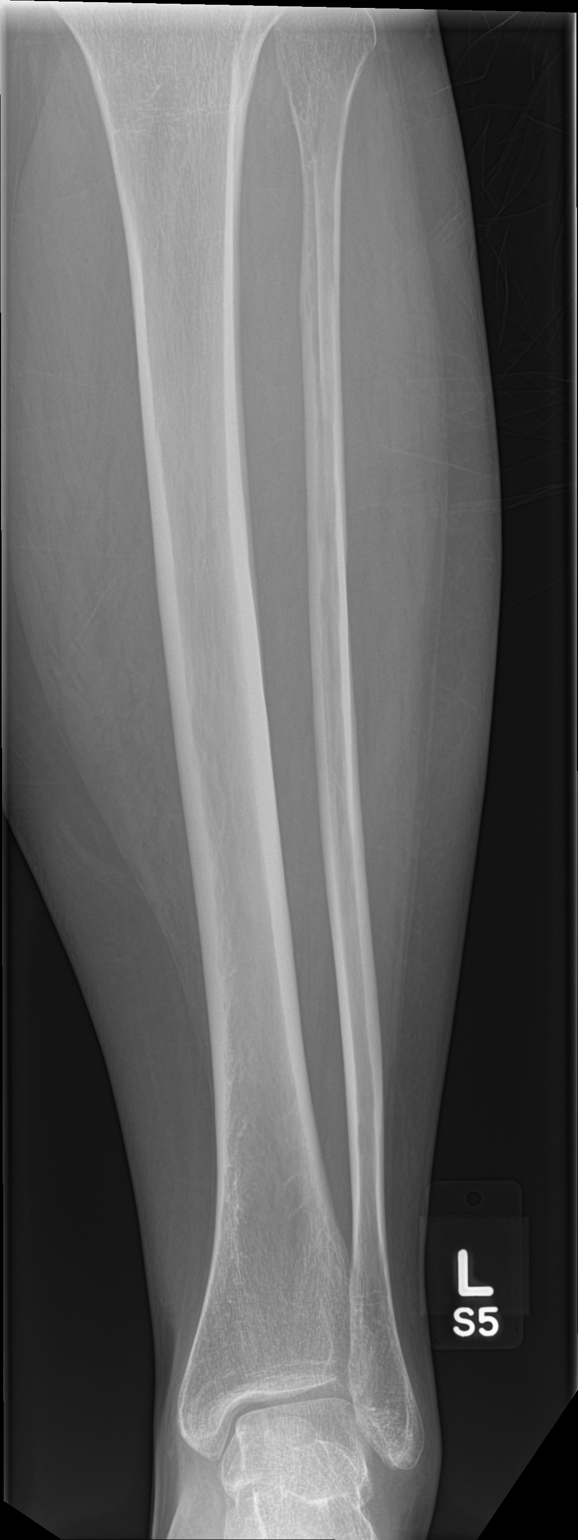

[tibia ap (2 of 2)]
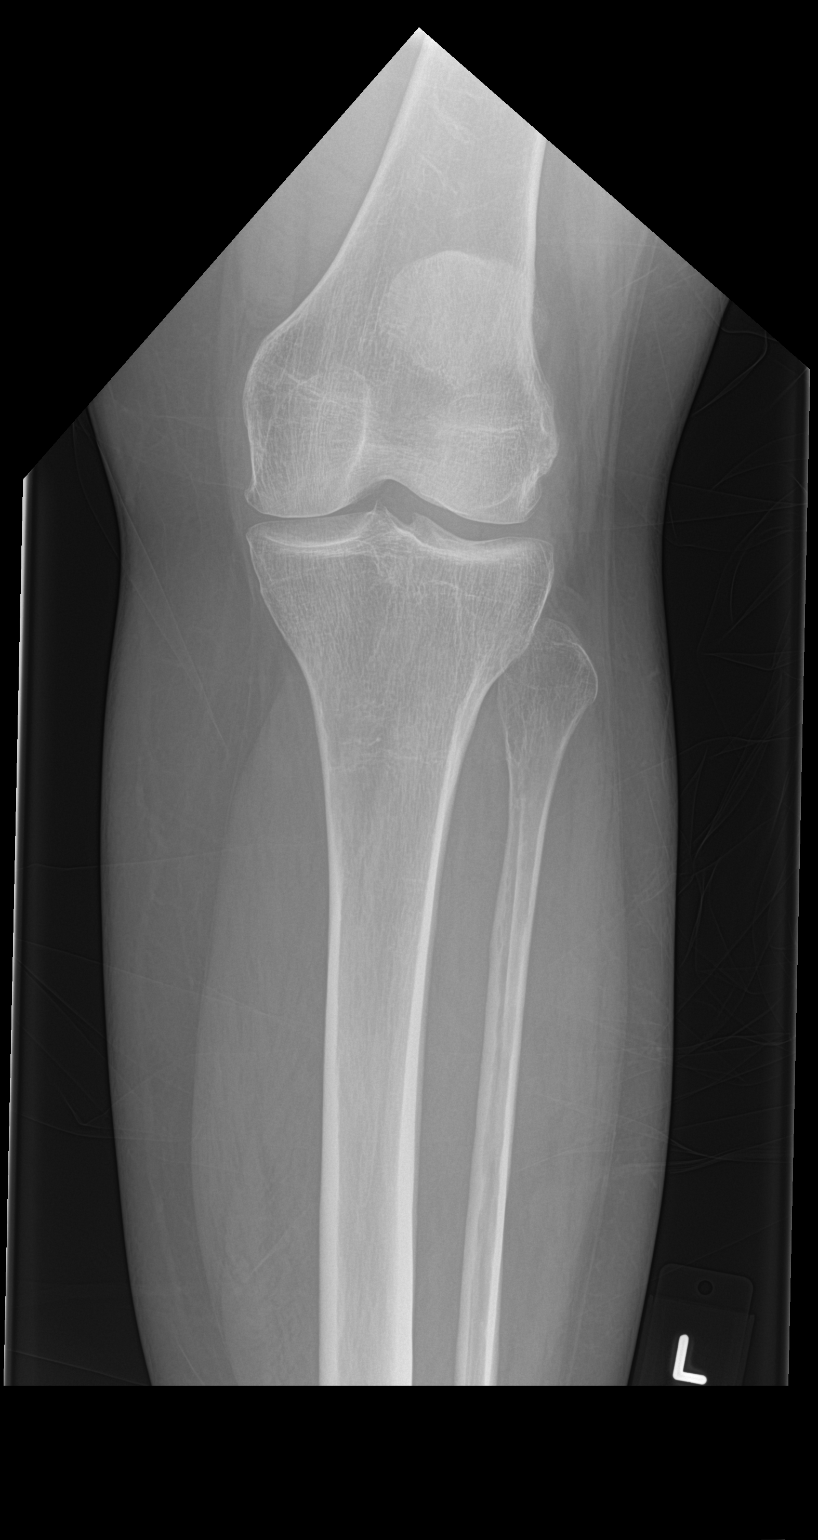

[tibia lat (1 of 2)]
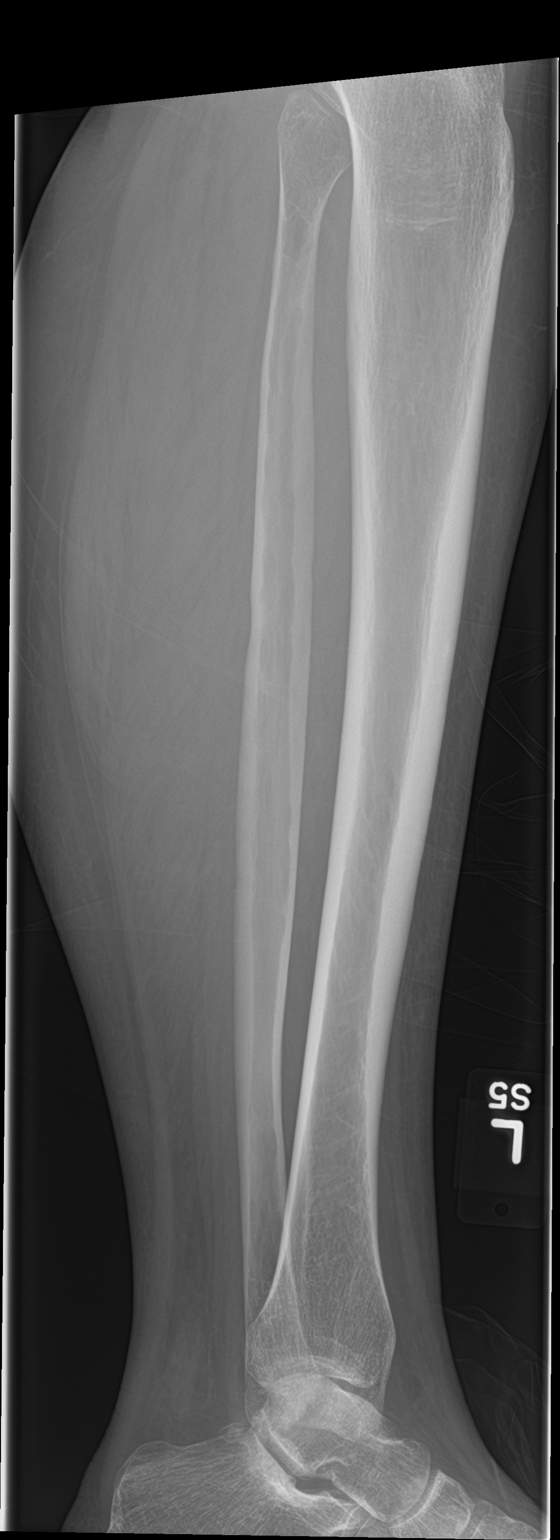

[tibia lat (2 of 2)]
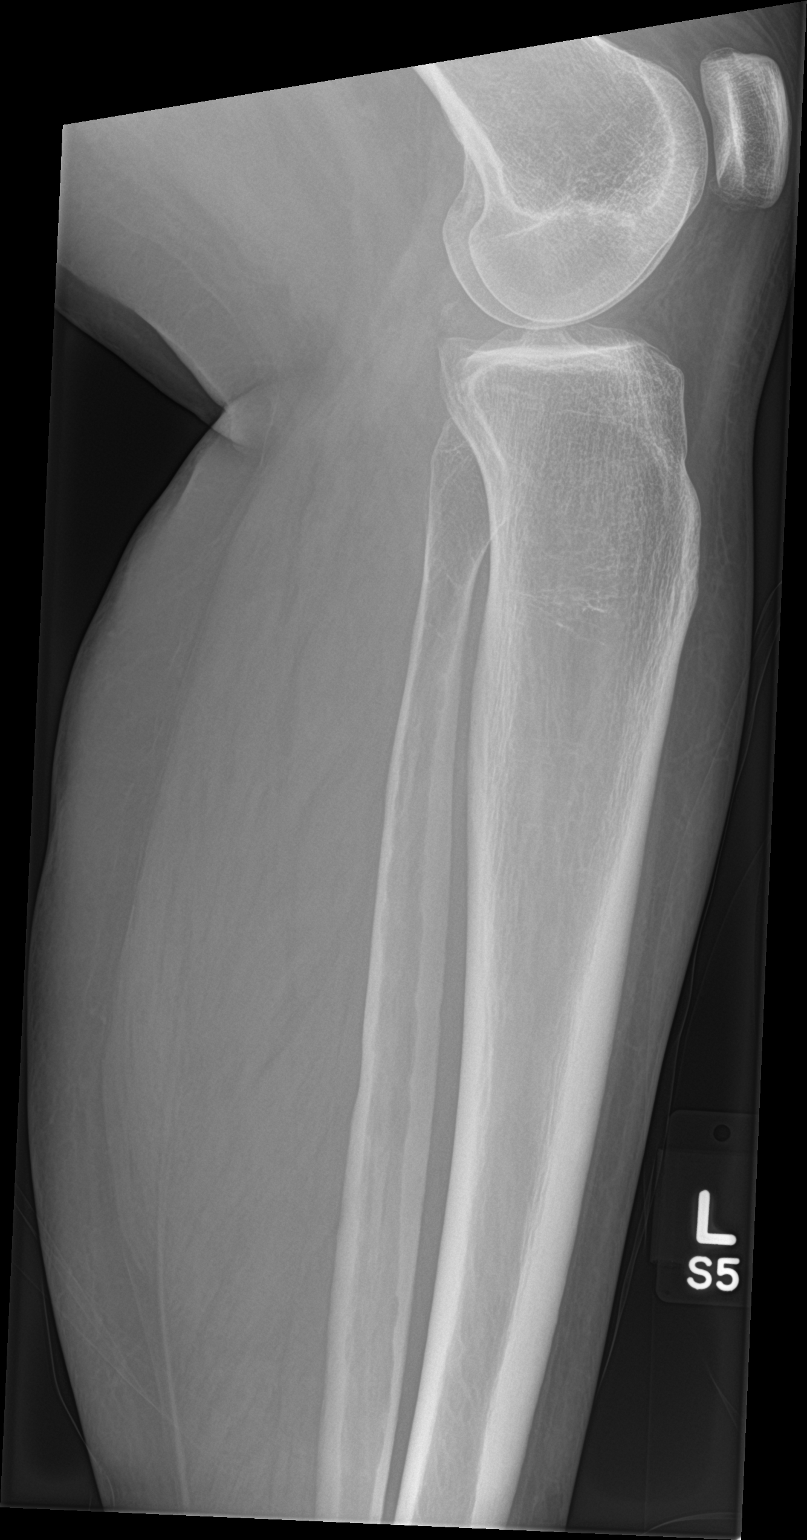

[4 of 4 positions shown; findings below may reference images not displayed]

FINDINGS: No evidence of fracture. No worrisome lytic or sclerotic osseous
abnormality.
IMPRESSION: No evidence for an acute fracture on the current study. The ankle
exam performed at same time raised the question of an oblique
nondisplaced distal fibula fracture, but that is not evident on this
two-view exam of the length.

## 2018-07-23 IMAGING — DX DG LUMBAR SPINE COMPLETE 4+V
5 series · 5 of 5 positions shown · non-contrast
Comparison: MR 05/19/2018 and previous

CLINICAL DATA: Pain post fall

EXAM:
LUMBAR SPINE - COMPLETE 4+ VIEW

[l-spine ap]
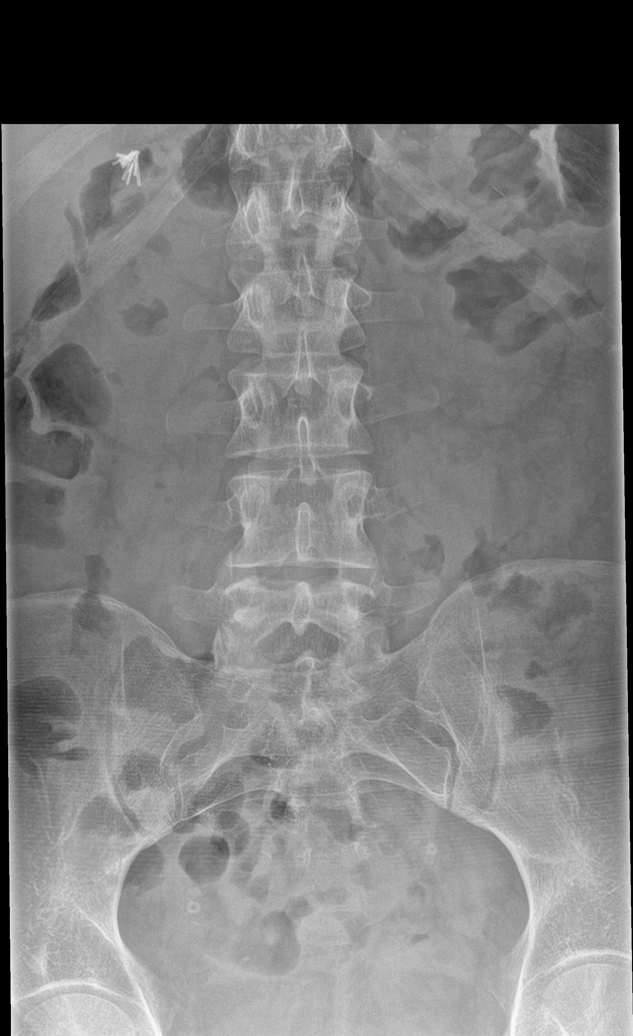

[l-spine obl (1 of 2)]
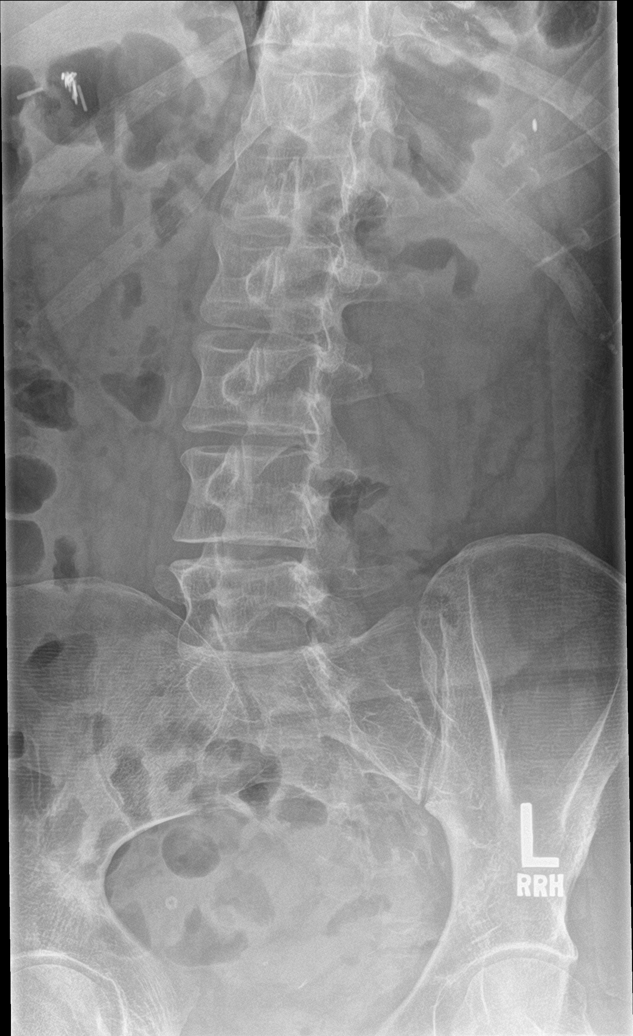

[l-spine obl (2 of 2)]
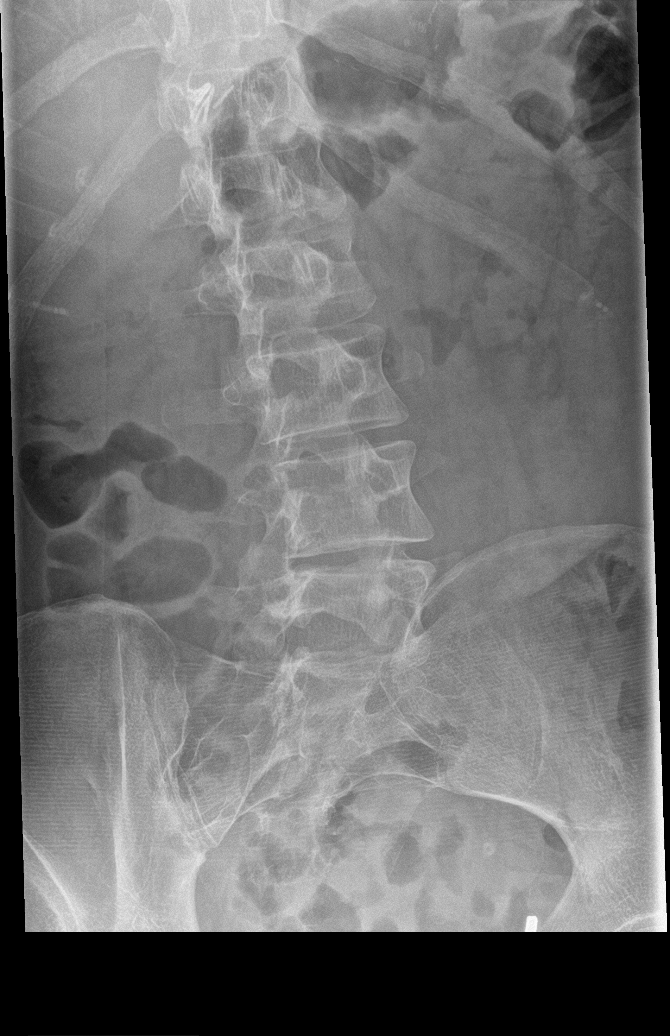

[l-spine lat]
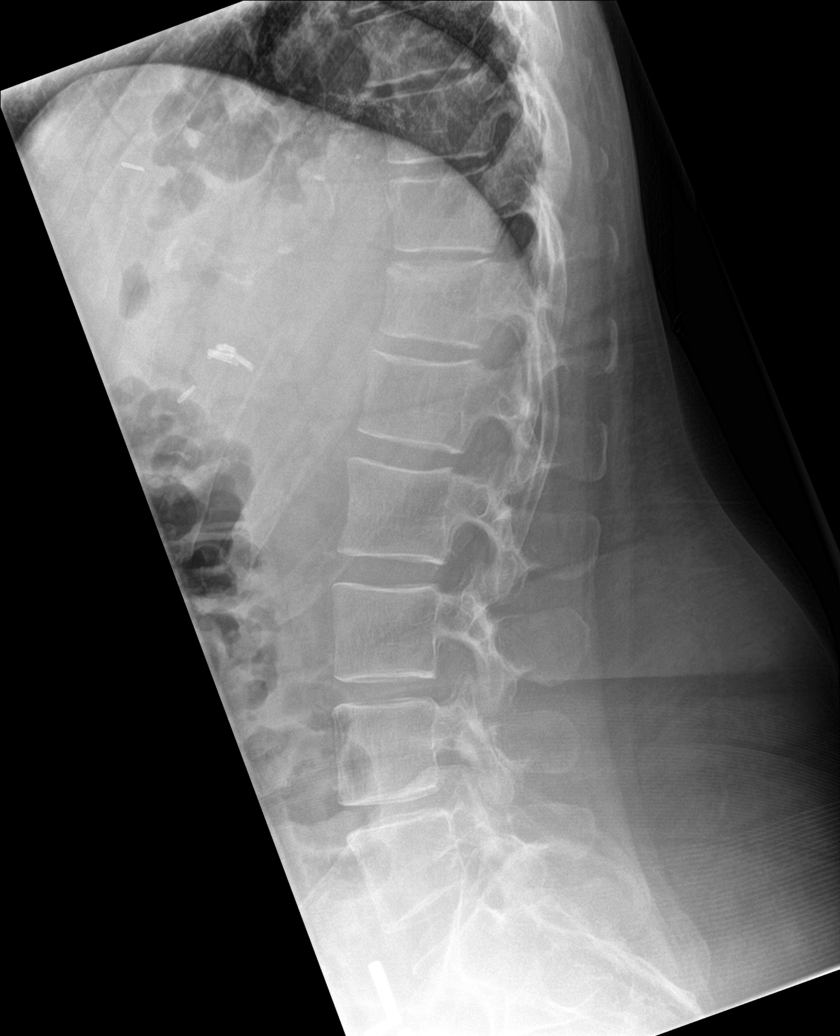

[l-spine spot]
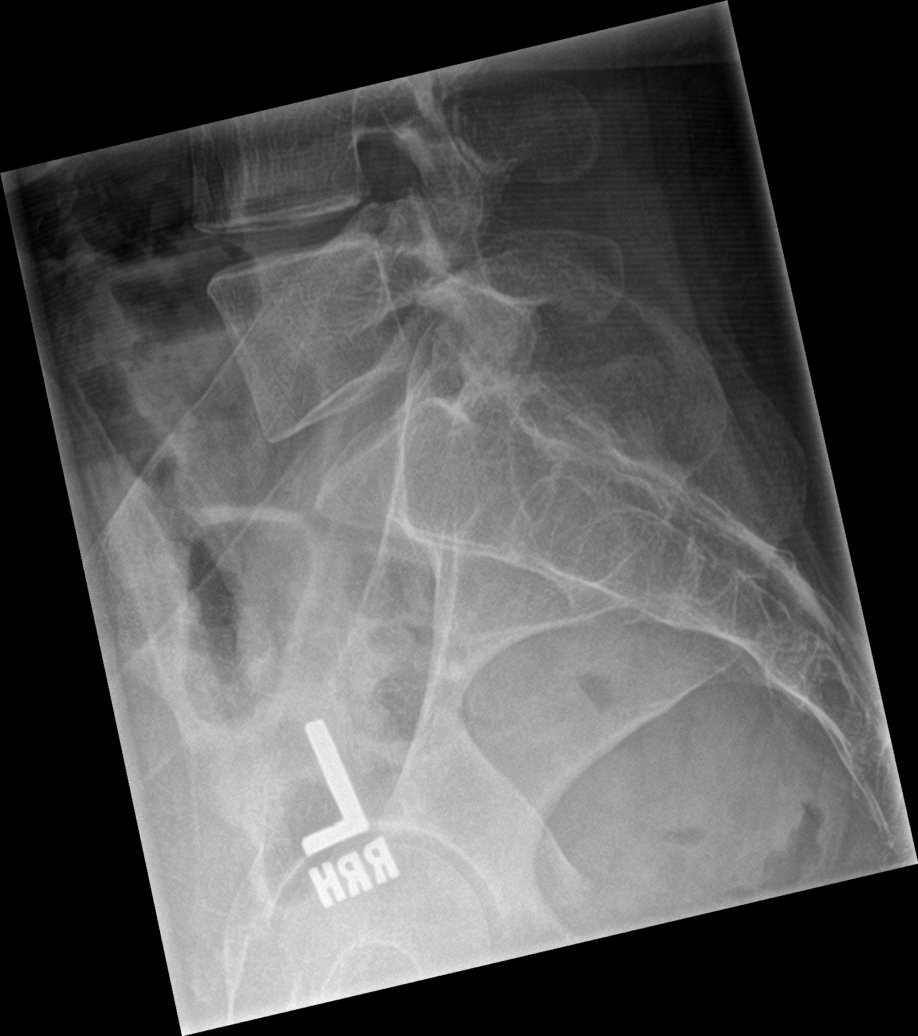

[5 of 5 positions shown; findings below may reference images not displayed]

FINDINGS: Mild superior endplate compression deformity of T12 as before. No
new fracture. Mild narrowing of L3-4 and L4-5 interspaces. No
significant osseous degenerative change. Cholecystectomy clips.
IMPRESSION: 1. Stable T12 compression deformity.
2. Mild disc narrowing L3-4, L4-5.

## 2018-07-23 IMAGING — DX DG ANKLE COMPLETE 3+V*L*
3 series · 3 of 3 positions shown · non-contrast
Comparison: 10/20/2015

CLINICAL DATA: Left ankle pain following a fall 4 days ago and
landed on her left knee on concrete. Now with ankle pain.

EXAM:
LEFT ANKLE COMPLETE - 3+ VIEW

[ankle ap]
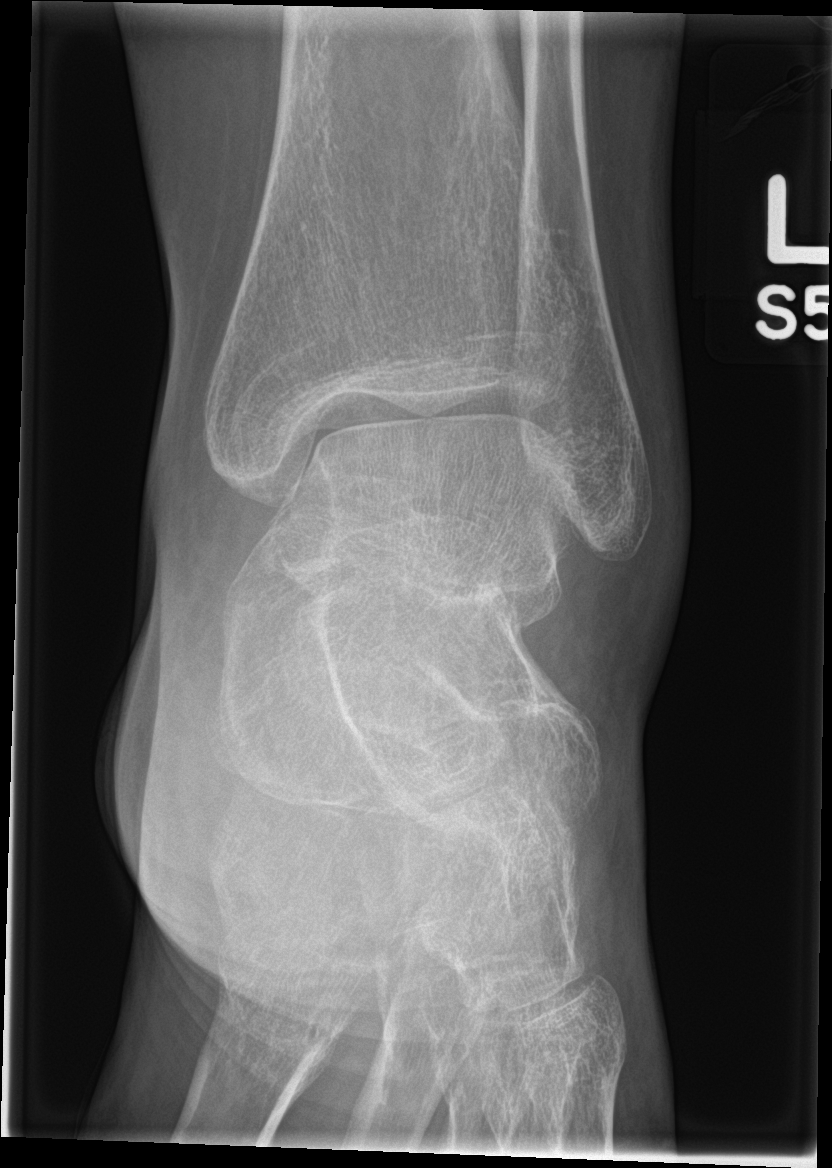

[ankle obl]
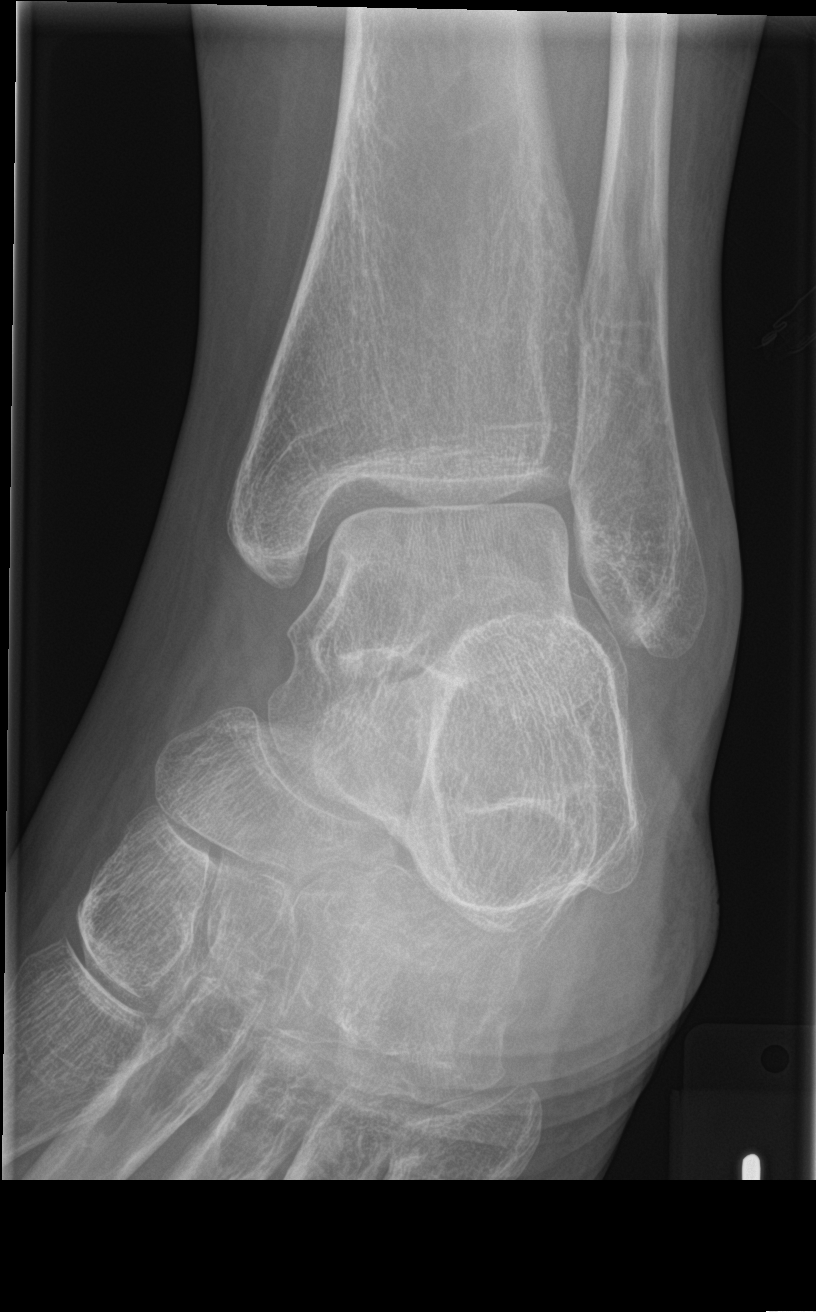

[ankle lat]
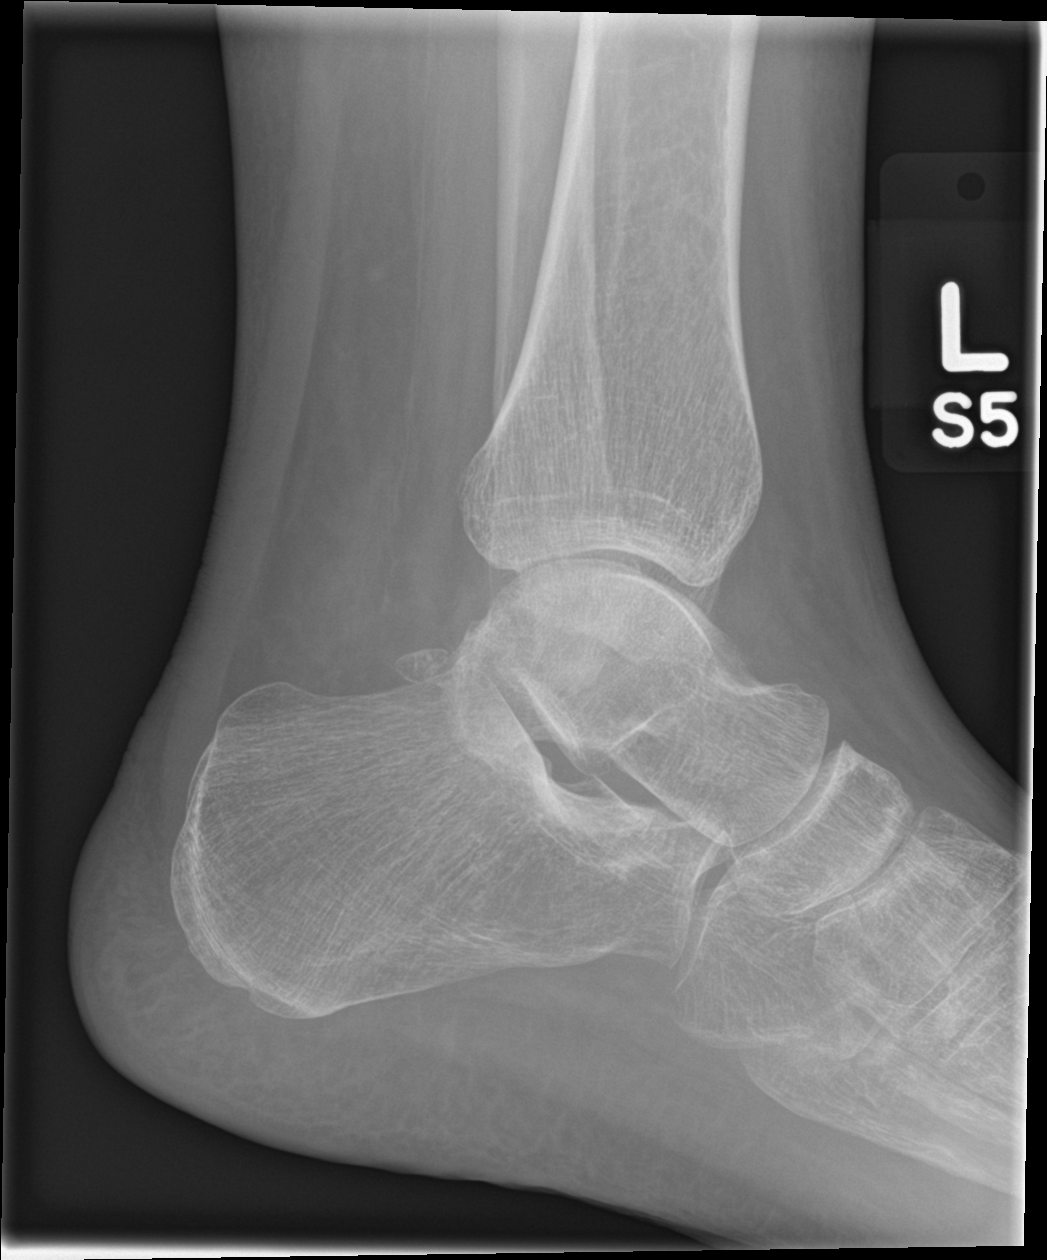

[3 of 3 positions shown; findings below may reference images not displayed]

FINDINGS: Three views study shows a subtle linear lucency in the distal fibula
which may be artifact, but a nondisplaced oblique fracture the
distal fibula not entirely excluded. Otherwise, no acute fracture is
evident. Ankle mortise is preserved. Bones are diffusely
demineralized.
IMPRESSION: Question nondisplaced oblique fracture the distal fibula, but not a
definite finding. If the patient has point tenderness over the
lateral malleolus , additional imaging by CT may prove helpful.

## 2018-07-23 IMAGING — DX DG KNEE COMPLETE 4+V*L*
4 series · 4 of 4 positions shown · non-contrast
Comparison: None.

CLINICAL DATA: Left ankle pain following a fall 4 days ago and
landed on her left knee on concrete. Now with ankle pain.

EXAM:
LEFT KNEE - COMPLETE 4+ VIEW

[knee ap (1 of 3)]
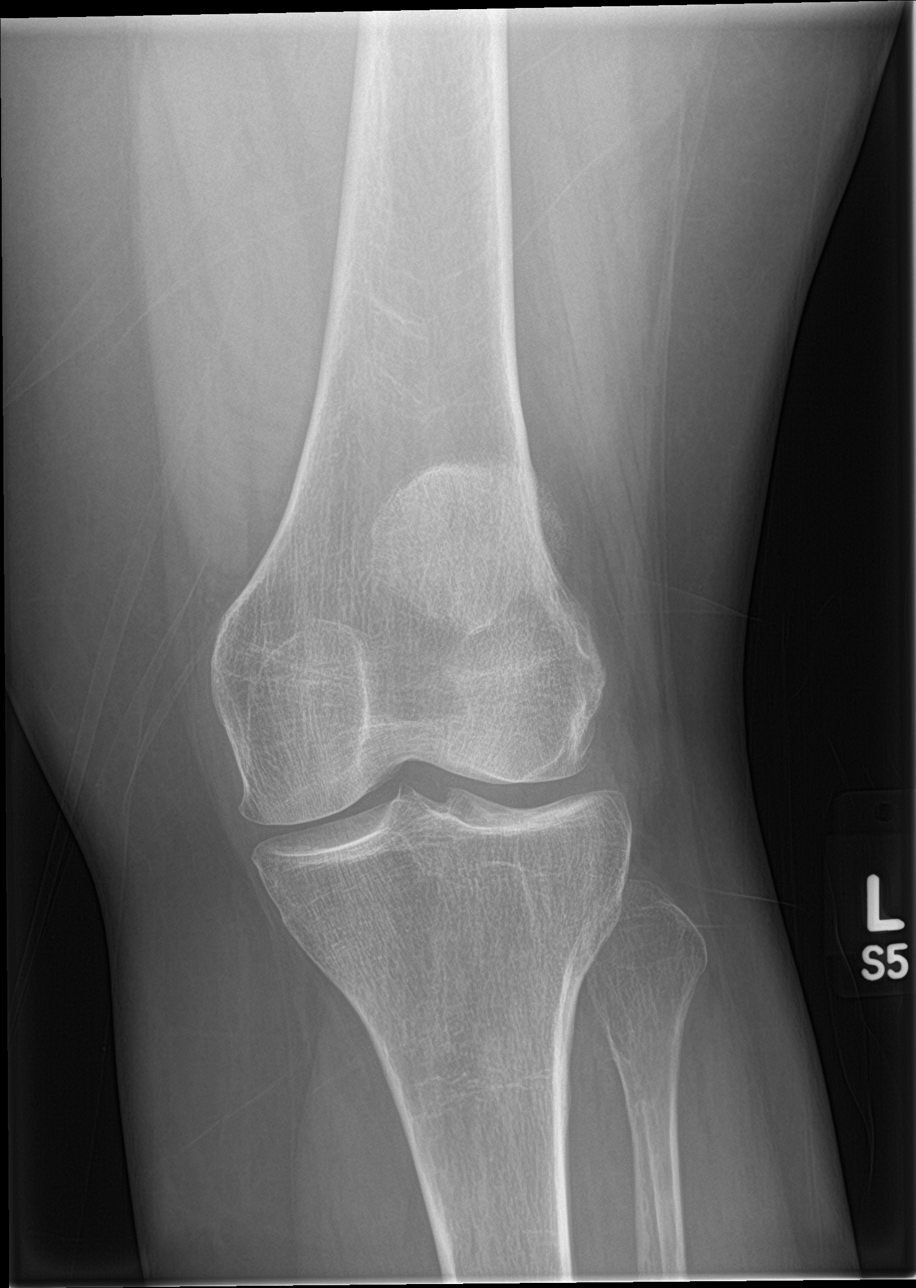

[knee lat]
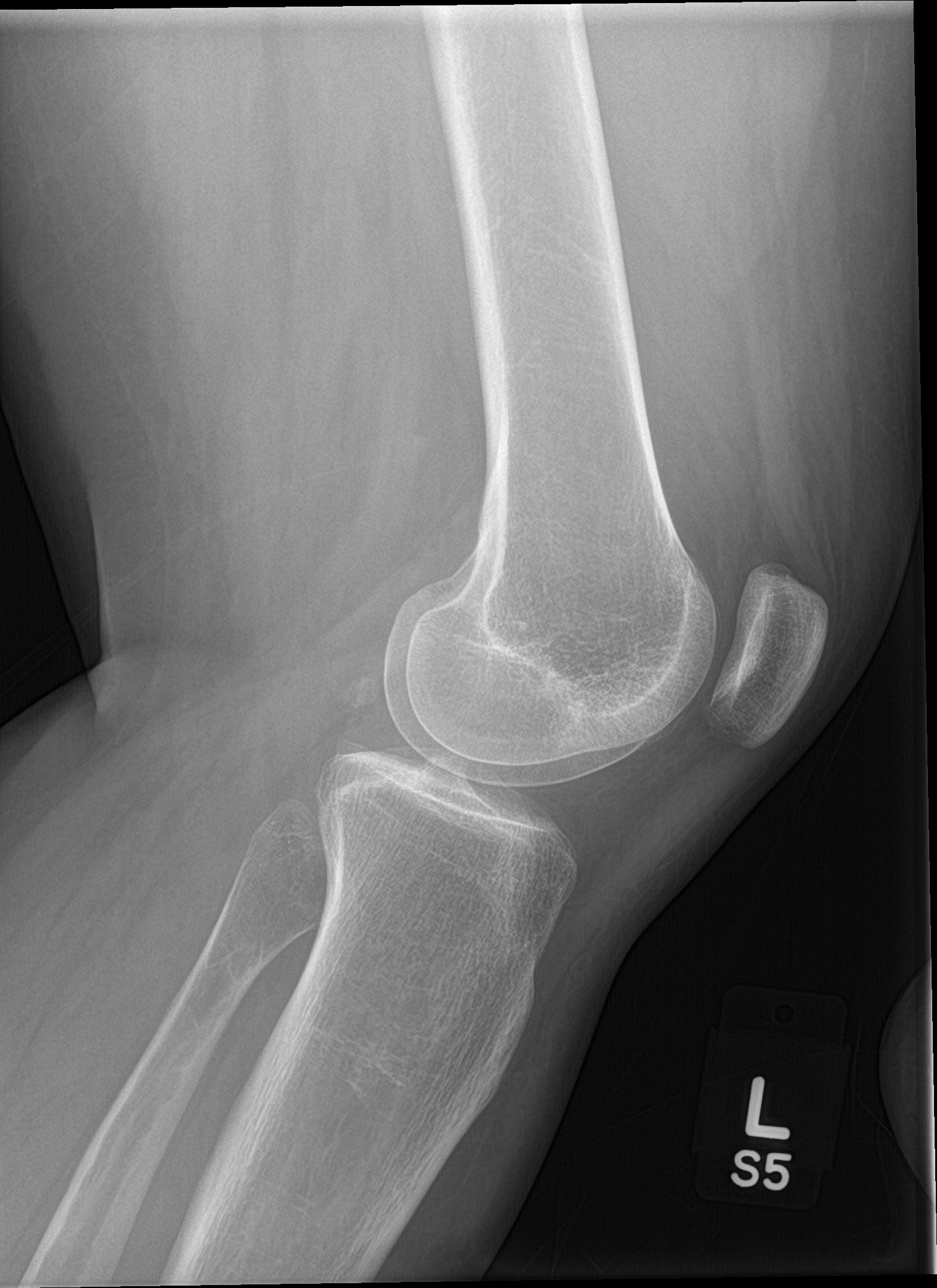

[knee ap (2 of 3)]
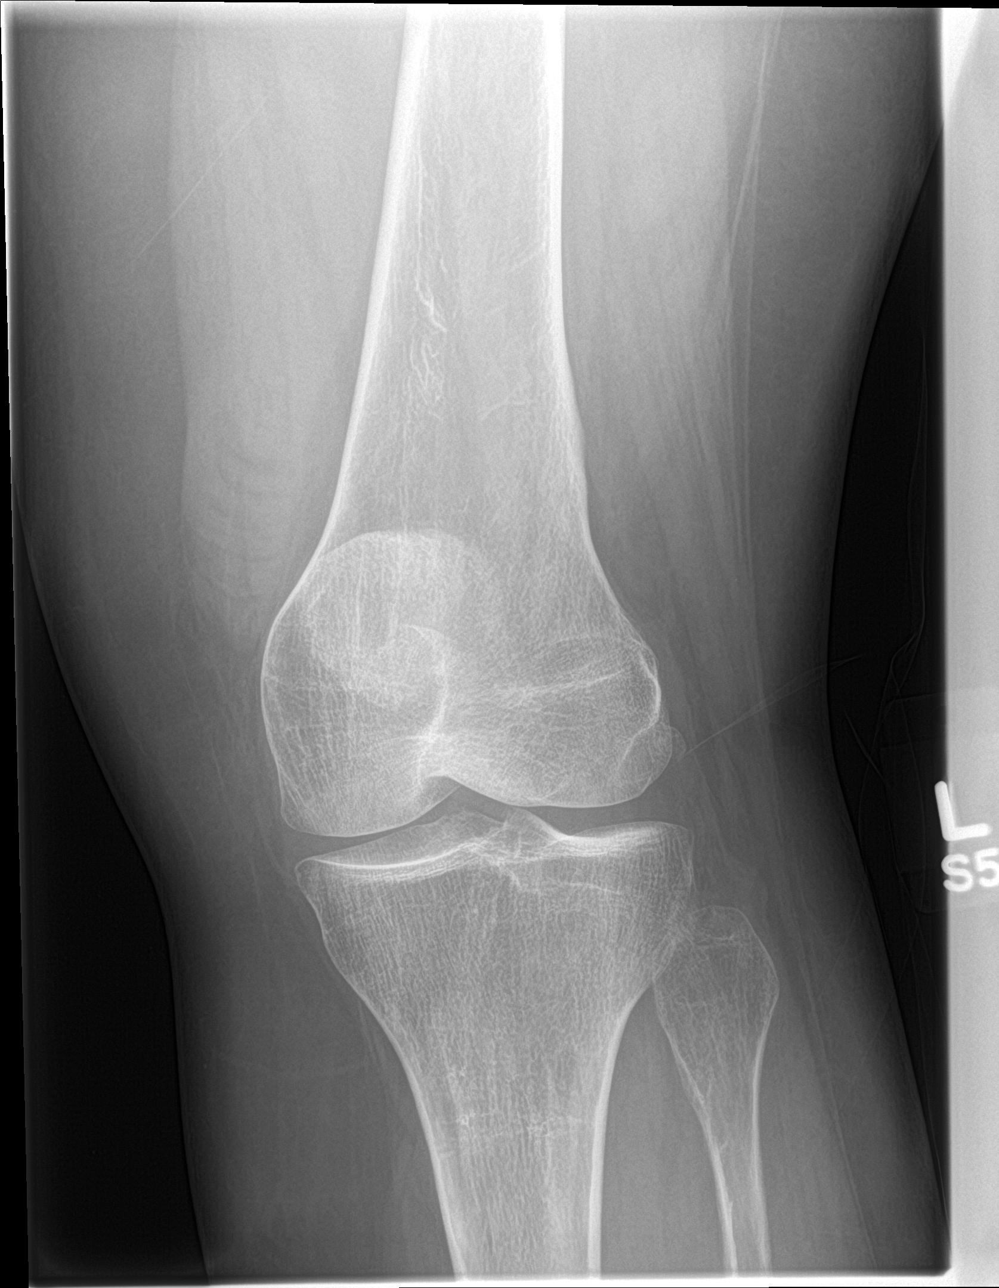

[knee ap (3 of 3)]
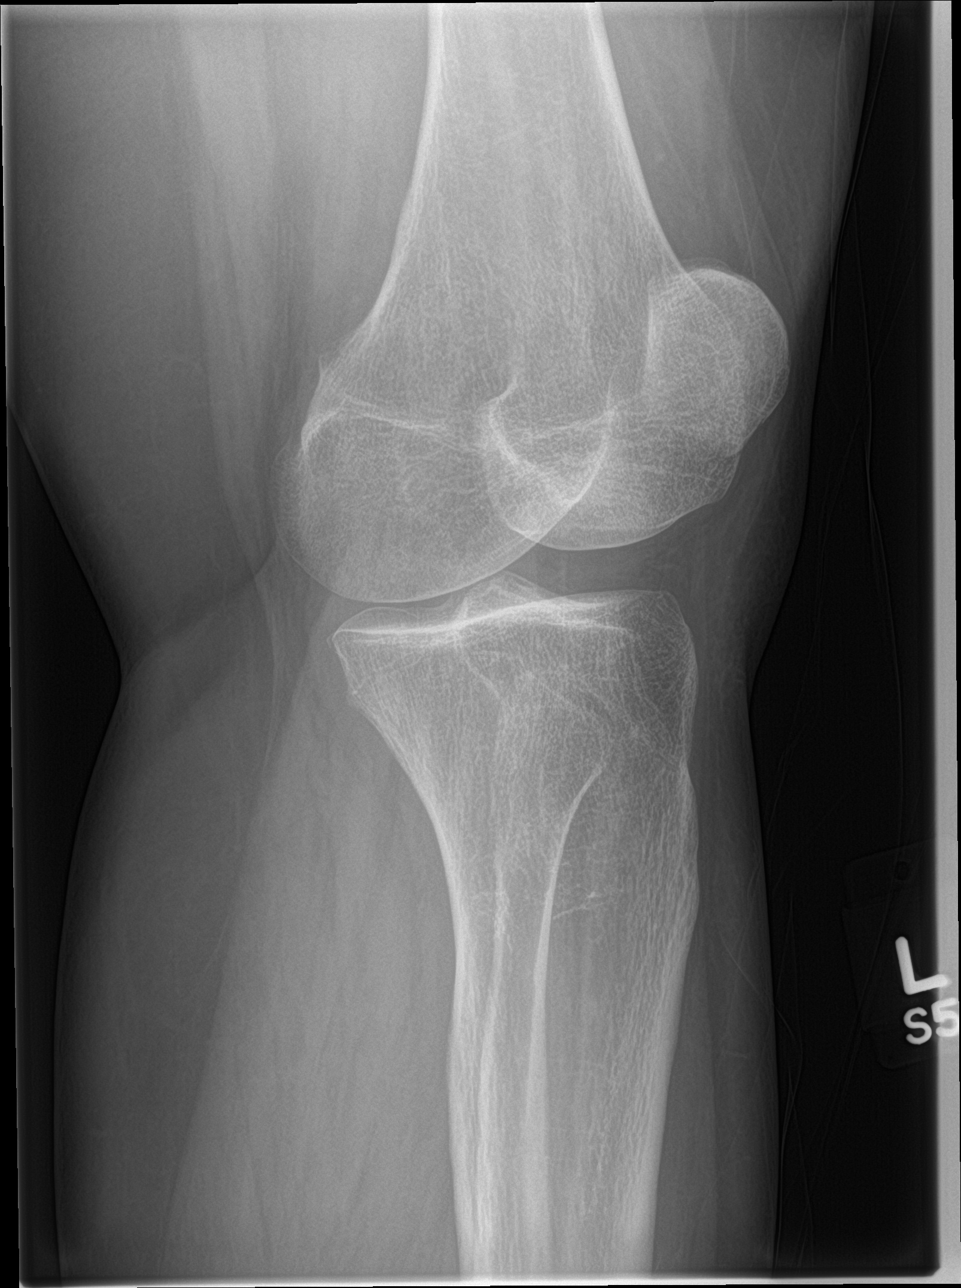

[4 of 4 positions shown; findings below may reference images not displayed]

FINDINGS: No evidence of fracture, dislocation, or joint effusion. No evidence
of arthropathy or other focal bone abnormality. Soft tissues are
unremarkable.
IMPRESSION: Negative.

## 2018-07-23 IMAGING — DX DG CERVICAL SPINE COMPLETE 4+V
5 series · 6 of 6 positions shown · non-contrast
Comparison: MR 01/23/2016

CLINICAL DATA: NECK, UPPER AND LOWER BACK PAIN S/P SLIPPING AND
FALLING DOWN STEPS AT HOME 2 DAYS AGO, KNOWN T-12 FRACTURE FROM FALL
AT WORK [DATE] OR [DATE]

EXAM:
CERVICAL SPINE - COMPLETE 4+ VIEW

[c-spine lat]
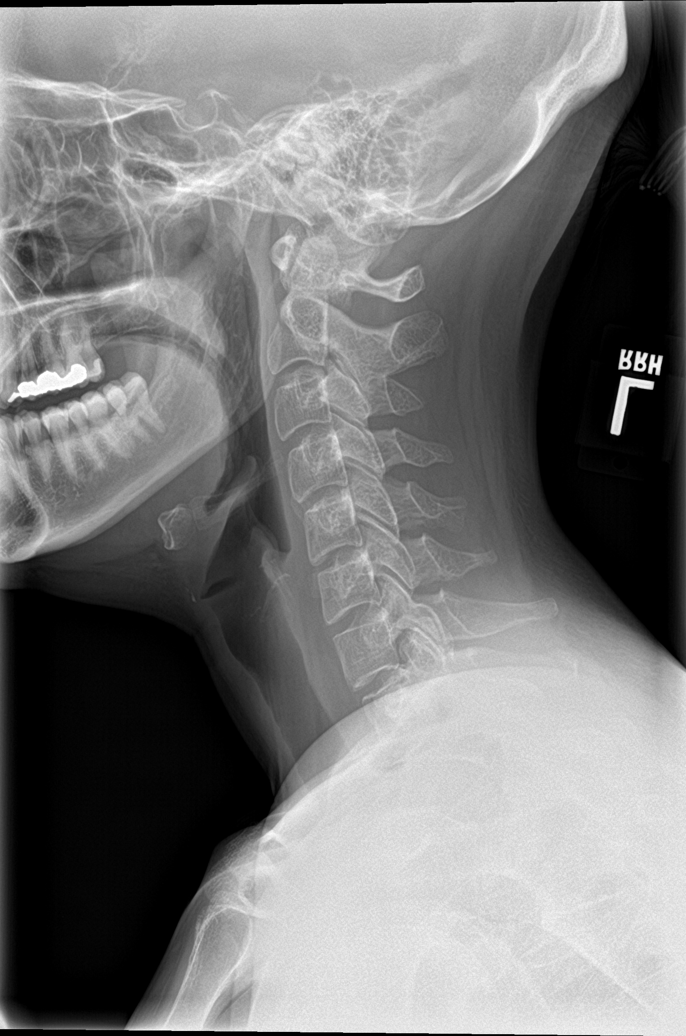

[c-spine obl (1 of 2)]
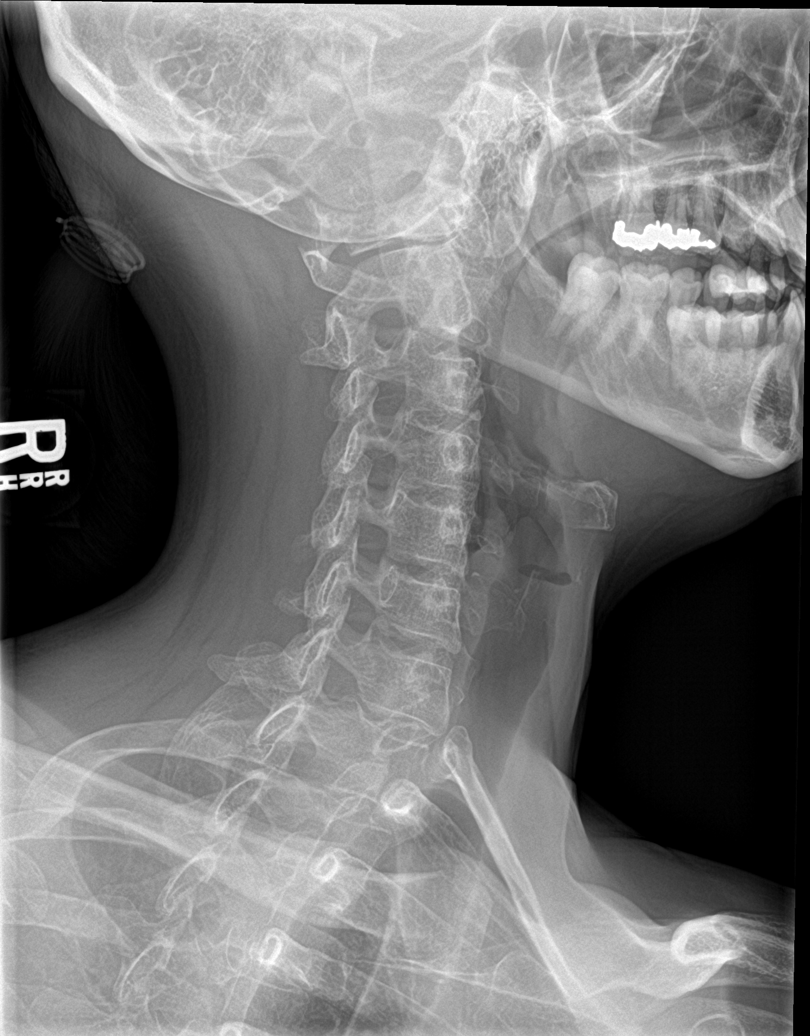

[c-spine obl (2 of 2)]
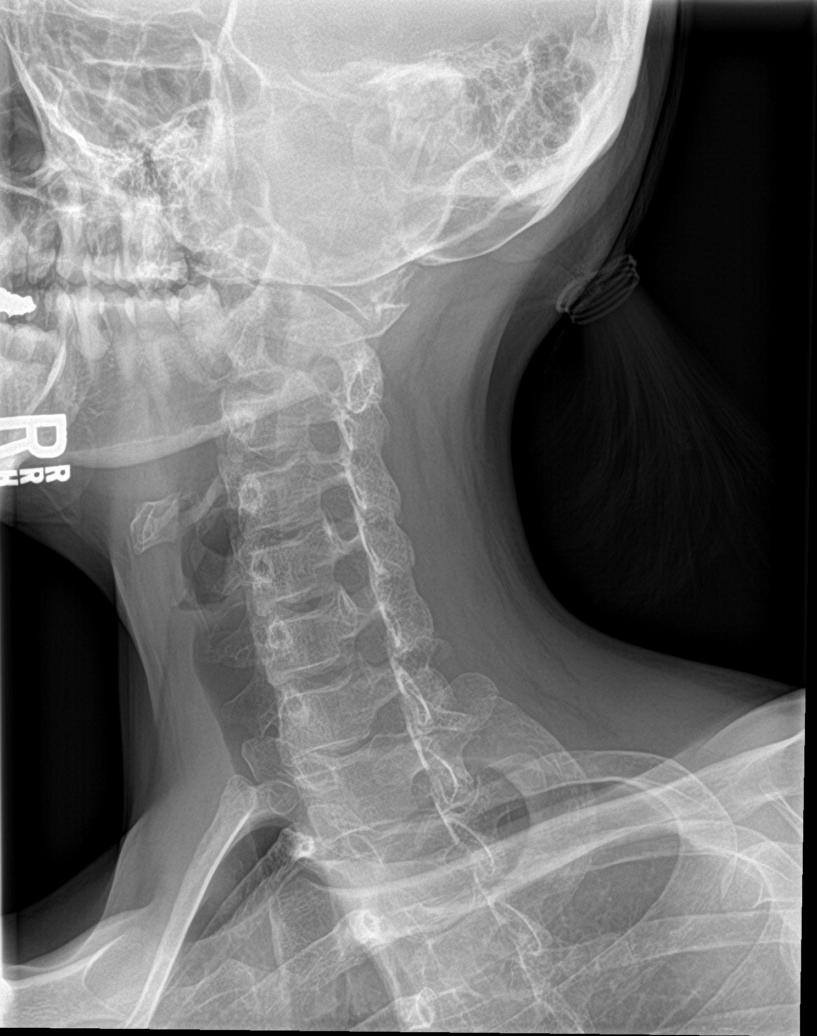

[c-spine ap]
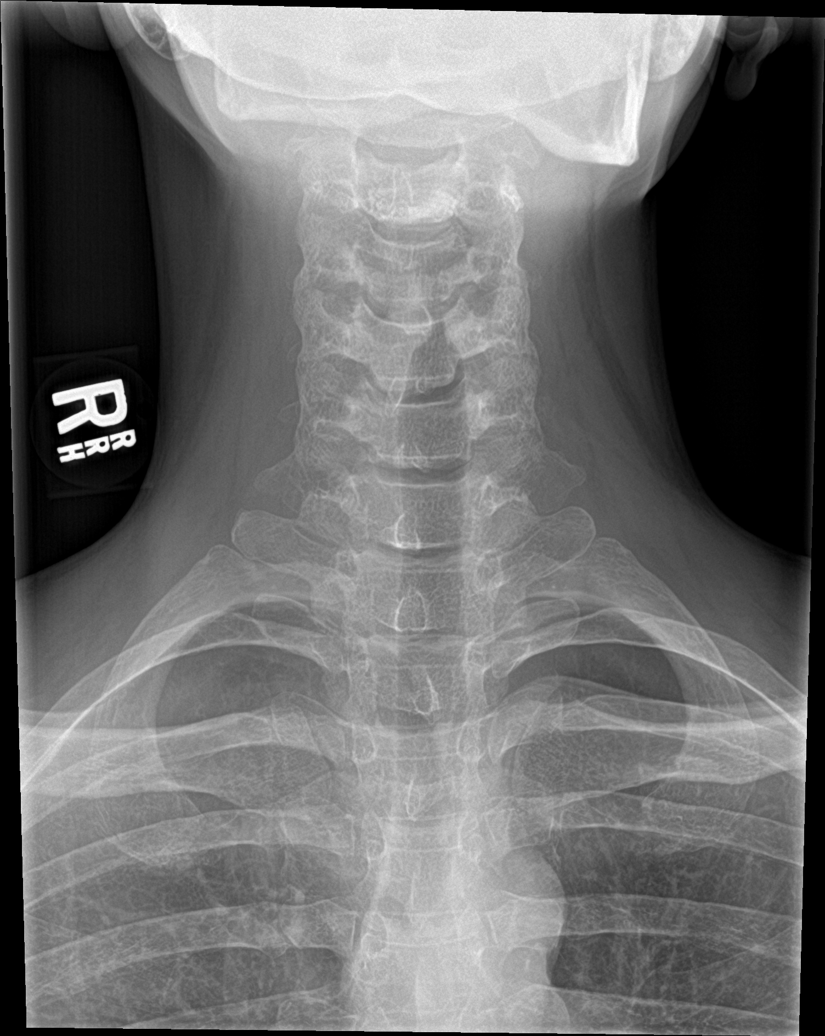

[Series 5: c-spine open mouth · 0.14mm/px · 2 of 2 slices shown]
[im 1/2]
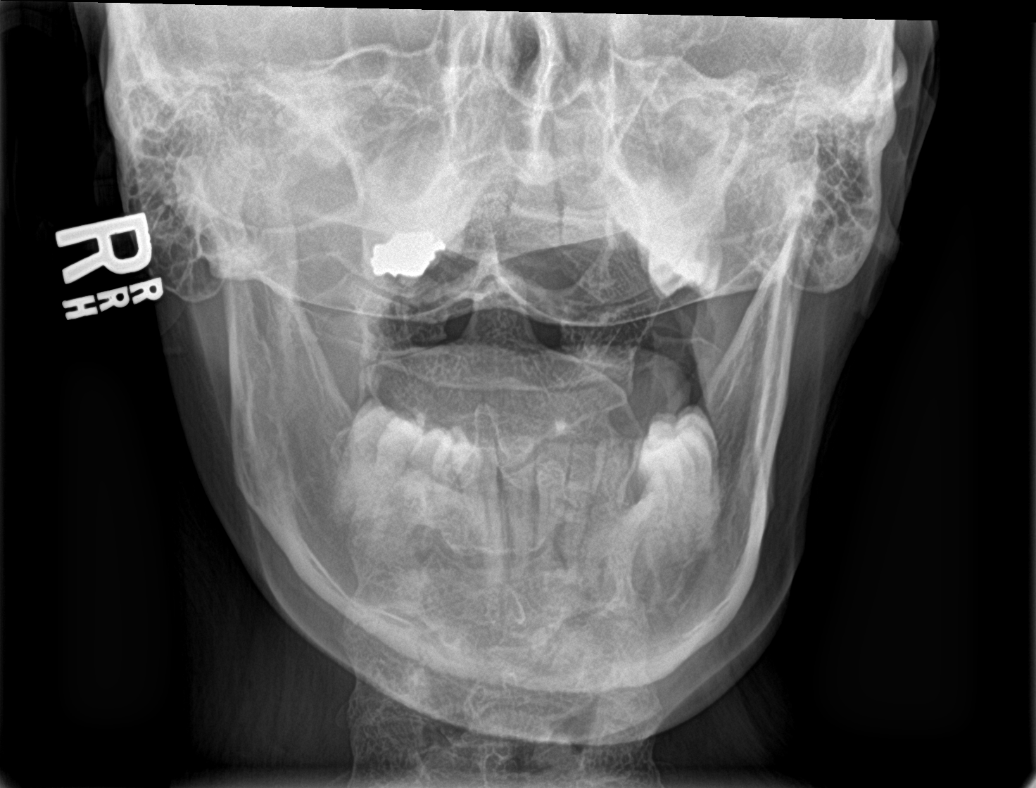
[im 2/2]
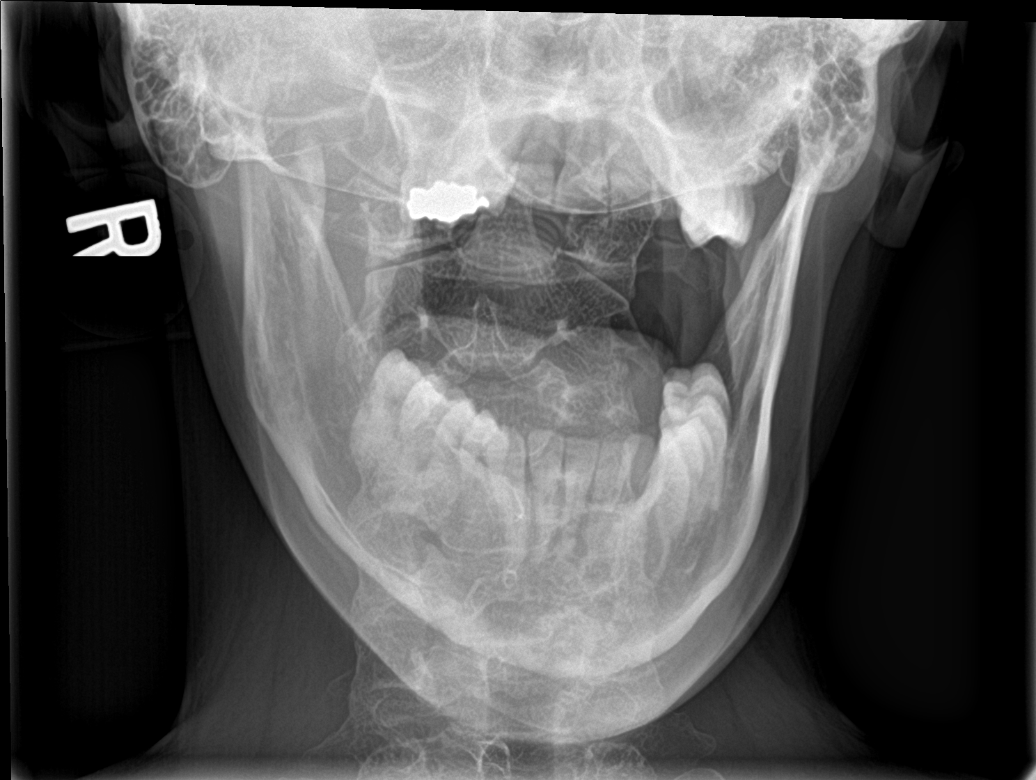

[6 of 6 positions shown; findings below may reference images not displayed]

FINDINGS: There is no evidence of cervical spine fracture or prevertebral soft
tissue swelling. Alignment is normal. No other significant bone
abnormalities are identified. Dental restorations.
IMPRESSION: Negative cervical spine radiographs.

## 2018-07-24 NOTE — Pre-Procedure Instructions (Signed)
Gracelin Weisberg Hovsepian  07/24/2018      [facility] - [place], [place] [address] Phone: [phone] Fax: [phone]    Your procedure is scheduled on Wednesday August 28.  Report to [facility] Admitting at 10:00 A.M.  Call this number if you have problems the morning of surgery:  [phone]   Remember:  Do not eat or drink after midnight.    Take these medicines the morning of surgery with A SIP OF WATER:   Pantoprazole (Protonix) Vraylar (cariplazine) Topamax Valacyclovir (Valtrex) Oxycodone if needed Clonazepam (Klonopin) if needed Acetaminophen (tylenol) if needed Proair- bring inhaler with you to the hospital  DO NOT take Adderall the day of surgery  7 days prior to surgery STOP taking any Aspirin(unless otherwise instructed by your surgeon), Aleve, Naproxen, Ibuprofen, Motrin, Advil, Goody's, BC's, all herbal medications, fish oil, and all vitamins     Do not wear jewelry, make-up or nail polish.  Do not wear lotions, powders, or perfumes, or deodorant.  Do not shave 48 hours prior to surgery.  Men may shave face and neck.  Do not bring valuables to the hospital.  [facility] is not responsible for any belongings or valuables.  Contacts, dentures or bridgework may not be worn into surgery.  Leave your suitcase in the car.  After surgery it may be brought to your room.  For patients admitted to the hospital, discharge time will be determined by your treatment team.  Patients discharged the day of surgery will not be allowed to drive home.   Special instructions:    [place]- Preparing For Surgery  Before surgery, you can play an important role. Because skin is not sterile, your skin needs to be as free of germs as possible. You can reduce the number of germs on your skin by washing with CHG (chlorahexidine gluconate) Soap before surgery.  CHG is an antiseptic cleaner which kills germs and bonds  with the skin to continue killing germs even after washing.    Oral Hygiene is also important to reduce your risk of infection.  Remember - BRUSH YOUR TEETH THE MORNING OF SURGERY WITH YOUR REGULAR TOOTHPASTE  Please do not use if you have an allergy to CHG or antibacterial soaps. If your skin becomes reddened/irritated stop using the CHG.  Do not shave (including legs and underarms) for at least 48 hours prior to first CHG shower. It is OK to shave your face.  Please follow these instructions carefully.   1. Shower the NIGHT BEFORE SURGERY and the MORNING OF SURGERY with CHG.   2. If you chose to wash your hair, wash your hair first as usual with your normal shampoo.  3. After you shampoo, rinse your hair and body thoroughly to remove the shampoo.  4. Use CHG as you would any other liquid soap. You can apply CHG directly to the skin and wash gently with a scrungie or a clean washcloth.   5. Apply the CHG Soap to your body ONLY FROM THE NECK DOWN.  Do not use on open wounds or open sores. Avoid contact with your eyes, ears, mouth and genitals (private parts). Wash Face and genitals (private parts)  with your normal soap.  6. Wash thoroughly, paying special attention to the area where your surgery will be performed.  7. Thoroughly rinse your body with warm water from the neck down.  8. DO NOT shower/wash with your  normal soap after using and rinsing off the CHG Soap.  9. Pat yourself dry with a CLEAN TOWEL.  10. Wear CLEAN PAJAMAS to bed the night before surgery, wear comfortable clothes the morning of surgery  11. Place CLEAN SHEETS on your bed the night of your first shower and DO NOT SLEEP WITH PETS.    Day of Surgery:  Do not apply any deodorants/lotions.  Please wear clean clothes to the hospital/surgery center.   Remember to brush your teeth WITH YOUR REGULAR TOOTHPASTE.    Please read over the following fact sheets that you were given. Coughing and Deep Breathing, MRSA  Information and Surgical Site Infection Prevention

## 2018-07-25 ENCOUNTER — Encounter (HOSPITAL_COMMUNITY): Payer: Self-pay

## 2018-07-25 ENCOUNTER — Encounter (HOSPITAL_COMMUNITY)
Admission: RE | Admit: 2018-07-25 | Discharge: 2018-07-25 | Disposition: A | Discharge status: Home / Self Care | Payer: Medicaid Other | Source: Ambulatory Visit | Attending: Orthopedic Surgery | Admitting: Orthopedic Surgery

## 2018-07-25 ENCOUNTER — Other Ambulatory Visit: Payer: Self-pay

## 2018-07-25 DIAGNOSIS — Z01818 Encounter for other preprocedural examination: Secondary | ICD-10-CM | POA: Insufficient documentation

## 2018-07-25 DIAGNOSIS — M4854XA Collapsed vertebra, not elsewhere classified, thoracic region, initial encounter for fracture: Secondary | ICD-10-CM | POA: Insufficient documentation

## 2018-07-25 LAB — SURGICAL PCR SCREEN
MRSA, PCR: NEGATIVE
Staphylococcus aureus: NEGATIVE

## 2018-07-25 LAB — CBC
HCT: 37.4 % (ref 36.0–46.0)
Hemoglobin: 12.1 g/dL (ref 12.0–15.0)
MCH: 32.4 pg (ref 26.0–34.0)
MCHC: 32.4 g/dL (ref 30.0–36.0)
MCV: 100 fL (ref 78.0–100.0)
Platelets: 210 10*3/uL (ref 150–400)
RBC: 3.74 MIL/uL — ABNORMAL LOW (ref 3.87–5.11)
RDW: 12.3 % (ref 11.5–15.5)
WBC: 3.8 10*3/uL — ABNORMAL LOW (ref 4.0–10.5)

## 2018-07-25 LAB — COMPREHENSIVE METABOLIC PANEL
ALT: 31 U/L (ref 0–44)
ANION GAP: 5 (ref 5–15)
AST: 31 U/L (ref 15–41)
Albumin: 3.6 g/dL (ref 3.5–5.0)
Alkaline Phosphatase: 81 U/L (ref 38–126)
BUN: 9 mg/dL (ref 6–20)
CALCIUM: 9.1 mg/dL (ref 8.9–10.3)
CHLORIDE: 107 mmol/L (ref 98–111)
CO2: 28 mmol/L (ref 22–32)
Creatinine, Ser: 0.75 mg/dL (ref 0.44–1.00)
GFR calc non Af Amer: >60 mL/min (ref >60)
Glucose, Bld: 100 mg/dL — ABNORMAL HIGH (ref 70–99)
POTASSIUM: 4.1 mmol/L (ref 3.5–5.1)
SODIUM: 140 mmol/L (ref 135–145)
Total Bilirubin: 0.5 mg/dL (ref 0.3–1.2)
Total Protein: 6.2 g/dL — ABNORMAL LOW (ref 6.5–8.1)

## 2018-07-25 NOTE — Progress Notes (Signed)
PCP: [name], MD  Cardiologist: denies  EKG: 07/10/18 in [facility]  Stress test: pt denies  ECHO: 07/13/18 in [facility]  Cardiac Cath: pt denies  Chest x-ray: 01/11/18 in [facility]

## 2018-07-25 NOTE — H&P (Addendum)
Patient ID: Penny [name] MRN: [id number] DOB/AGE: Mar 21, 1986 32 y.o.  Admit date: (Not on file)  Admission Diagnoses:  Thoracic Compression Fracture  HPI: The patient is here today for a pre-operative History and Physical. They are scheduled for Kyphoplasty T12 on 08-02-18 with Dr. Rolena Infante at  [facility].  Pt reports a hx of Anxiety, depression, gastric by pass (2007), ADHD, Chronic pain syndrome.  Past Medical History: Past Medical History:  Diagnosis Date  . ADD (attention deficit disorder)   . Anemia 2011   2o to GASTRIC BYPASS  . Anginal pain ([place])   . Anxiety   . Blood transfusion without reported diagnosis   . Chronic daily headache   . Depression   . Depression   . Dysmenorrhea 09/18/2013  . Dysrhythmia   . Elevated liver enzymes JUL 2011ALK PHOS 111-127 AST  143-267 ALT  213-321T BILI 0.6  ALB  3.7-4.06 Jun 2011 ALK PHOS 118 AST 24 ALT 42 T BILI 0.4 ALB 3.9  . Encounter for drug rehabilitation    at behavioral health for opioid addiction about 4 years ago  . Family history of adverse reaction to anesthesia    'dad had to be kept on pump for breathing for morphine'  . Fatty liver   . Fibroid 01/18/2017  . Fibromyalgia   . Gastric bypass status for obesity   . Gastritis JULY 2011  . Heart murmur   . 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 History of Holter monitoring   . Hx of opioid abuse    for about 4 years, about 4 years ago  . Interstitial cystitis   . Iron deficiency anemia 07/23/2010  . Irritable bowel syndrome 2012 DIARRHEA   JUN 2012 TTG IgA 14.9  . IUD FEB 2010  . Lupus ([place])   . Menorrhagia 07/18/2013  . Migraines   . Obesity (BMI 30-39.9) 2011 228 LBS BMI 36.8  . Ovarian cyst   . Patient desires pregnancy 09/18/2013  . Polyneuropathy   . PONV (postoperative nausea and vomiting) seizure post-operatively   seizure following ankle surgery  . Potassium (K) deficiency   . Pregnant 12/25/2013  . Psychiatric  pseudoseizure   . RLQ abdominal pain 07/18/2013  . Sciatica of left side 11/14/2014  . 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 Stress 09/18/2013    Surgical History: Past Surgical History:  Procedure Laterality Date  . BIOPSY  04/10/2015   Procedure: BIOPSY;  Surgeon: Daneil Dolin, MD;  Location: [facility];  Service: Endoscopy;;  . cath self every nite     for sodium bicarb injection (discontinued 2013)  . CHOLECYSTECTOMY  2005   biliary dyskinesia  . COLONOSCOPY  JUN 2012 ABD PN/DIARRHEA WITH PROPOFOL   NL COLON  . DILATION AND CURETTAGE OF UTERUS    . DILITATION & CURRETTAGE/HYSTROSCOPY WITH NOVASURE ABLATION N/A 03/24/2017   Procedure: DILATATION & CURETTAGE/HYSTEROSCOPY WITH NOVASURE ENDOMETRIAL ABLATION;  Surgeon: Jonnie Kind, MD;  Location: [facility];  Service: Gynecology;  Laterality: N/A;  . ESOPHAGEAL DILATION N/A 04/10/2015   Procedure: ESOPHAGEAL DILATION WITH 54FR MALONEY DILATOR;  Surgeon: Daneil Dolin, MD;  Location: [facility];  Service: Endoscopy;  Laterality: N/A;  . ESOPHAGOGASTRODUODENOSCOPY    . ESOPHAGOGASTRODUODENOSCOPY (EGD) WITH PROPOFOL N/A 04/10/2015   Procedure: ESOPHAGOGASTRODUODENOSCOPY (EGD) WITH PROPOFOL;  Surgeon: Daneil Dolin, MD;  Location: [facility];  Service: Endoscopy;  Laterality: N/A;  . ESOPHAGOGASTRODUODENOSCOPY (EGD) WITH PROPOFOL N/A 12/06/2016  Procedure: ESOPHAGOGASTRODUODENOSCOPY (EGD) WITH PROPOFOL;  Surgeon: Danie Binder, MD;  Location: [facility];  Service: Endoscopy;  Laterality: N/A;  . GAB  2007   in [place]-POUCH 5 CM  . GASTRIC BYPASS  06/2006  . HYSTEROSCOPY W/D&C N/A 09/12/2014   Procedure: DILATATION AND CURETTAGE /HYSTEROSCOPY;  Surgeon: Jonnie Kind, MD;  Location: [facility];  Service: Gynecology;  Laterality: N/A;  . LAPAROSCOPIC TUBAL LIGATION Bilateral 03/24/2017   Procedure: LAPAROSCOPIC TUBAL LIGATION ([place] Rings);  Surgeon: Jonnie Kind, MD;  Location: [facility];  Service: Gynecology;  Laterality: Bilateral;  .  REPAIR VAGINAL CUFF N/A 07/30/2014   Procedure: REPAIR VAGINAL CUFF;  Surgeon: Mora Bellman, MD;  Location: [facility];  Service: Gynecology;  Laterality: N/A;  . SAVORY DILATION  06/20/2012   Dr. Barnie Alderman gastritis/Ulcer in the mid jejunum. Empiric dilation.   . SURAL NERVE BX Left 02/25/2016   Procedure: LEFT SURAL NERVE BIOPSY;  Surgeon: Jovita Gamma, MD;  Location: [facility];  Service: Neurosurgery;  Laterality: Left;  Left sural nerve biopsy  . TONSILLECTOMY    . TONSILLECTOMY AND ADENOIDECTOMY    . UPPER GASTROINTESTINAL ENDOSCOPY  JULY 2011 NAUSEA-D125,V6, PH 25   Bx; GASTRITIS, POUCH-5 CM LONG  . WISDOM TOOTH EXTRACTION      Family History: Family History  Problem Relation Age of Onset  . Hemochromatosis Maternal Grandmother   . Migraines Maternal Grandmother   . Cancer Maternal Grandmother   . Breast cancer Maternal Grandmother   . Hypertension Father   . Diabetes Father   . Coronary artery disease Father   . Migraines Paternal Grandmother   . Breast cancer Paternal Grandmother   . Cancer Mother        breast  . Hemochromatosis Mother   . Breast cancer Mother   . Depression Mother   . Anxiety disorder Mother   . Coronary artery disease Paternal Grandfather   . Anxiety disorder Brother   . Bipolar disorder Brother   . Healthy Daughter   . Healthy Son     Social History: Social History   Socioeconomic History  . Marital status: Divorced    Spouse name: Not on file  . Number of children: 2  . Years of education: some colle  . Highest education level: Not on file  Occupational History    Employer: NOT EMPLOYED  Social Needs  . Financial resource strain: Not on file  . Food insecurity:    Worry: Not on file    Inability: Not on file  . Transportation needs:    Medical: Not on file    Non-medical: Not on file  Tobacco Use  . Smoking status: Current Every Day Smoker    Packs/day: 0.25    Years: 6.00    Pack years: 1.50    Types: E-cigarettes  .  Smokeless tobacco: Never Used  Substance and Sexual Activity  . Alcohol use: Yes    Alcohol/week: 0.0 standard drinks    Comment: occasionally  . Drug use: Not Currently    Types: Marijuana    Comment: Previously opioid addiction went through rehab  . Sexual activity: Yes    Partners: Male    Birth control/protection: Surgical    Comment: tubal and ablation  Lifestyle  . Physical activity:    Days per week: Not on file    Minutes per session: Not on file  . Stress: Not on file  Relationships  . Social connections:    Talks on phone: Not  on file    Gets together: Not on file    Attends religious service: Not on file    Active member of club or organization: Not on file    Attends meetings of clubs or organizations: Not on file    Relationship status: Not on file  . Intimate partner violence:    Fear of current or ex partner: Not on file    Emotionally abused: Not on file    Physically abused: Not on file    Forced sexual activity: Not on file  Other Topics Concern  . Not on file  Social History Narrative   Patient is right handed.   Patient drinks one cup of coffee daily.   She lives in a one-story home with her two children (45 year old son, 24 month old daughter).   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  Vital Signs: No data found.  Radiology: No results found.  Labs: Recent Labs    07/25/18 1330  WBC 3.8*  RBC 3.74*  HCT 37.4  PLT 210   Recent Labs    07/25/18 1330  NA 140  K 4.1  CL 107  CO2 28  BUN 9  CREATININE 0.75  GLUCOSE 100*  CALCIUM 9.1   No results for input(s): LABPT, INR  in the last 72 hours.  Review of Systems: ROS  Physical Exam: There is no height or weight on file to calculate BMI.  Physical Exam  Constitutional: She is oriented to person, place, and time. She appears well-developed and well-nourished.  HENT:  Head: Normocephalic.  Cardiovascular: Normal rate and regular rhythm.  Respiratory: Effort normal and breath sounds normal.  GI: Soft. Bowel sounds are normal.  Neurological: She is alert and oriented to person, place, and time.  Skin: Skin is warm and dry.  Psychiatric: She has a normal mood and affect. Her behavior is normal. Judgment and thought content normal.   Assistive devices: Currently wearing a TLSO brace  Neuro: She is grossly neurologically intact with no focal motor deficits.  She does have numbness and dysesthesias in the left L5 and S1 dermatome.  Negative nerve retention signs in the lower extremity.  Negative Babinski test, 2+ symmetrical deep tendon reflexes at the knee in the Achilles  Musculoskeletal: Significant upper lumbar lower thoracic pain with direct palpation and range of motion.  Minor low back pain with palpation.  No SI joint pain with direct palpation.  No significant hip, knee, ankle pain with isolated joint range of motion  X-rays today compared to those taken on 06/02/18 show no change in the T12 compression fracture.  There is approximately 25% loss of anterior height.  With approximately 15 of local kyphosis.  MRI of the thoracic spine completed on 05/19/18 demonstrates edema in the T12 vertebral body with loss of anterior height compatible with a T12 compression fracture.  They noted 15% loss of height.  Chronic degenerative disease L3-4 L4-5.  There is no evidence of posterior wall involvement and no cord compression.  There is new hepatic periportal signal abnormality.  I have given a copy of the MRI to the patient to bring with her to her GI doctor for further evaluation.  CT scan  of the lumbar spine done  on 05/19/18 confirms the T12 compression fracture.  No other significant abnormalities are seen.  Assessment and Plan: Risks and benefits of surgery were discussed with the patient. These include: Infection, bleeding, death, stroke, paralysis, ongoing or worse pain, need for additional surgery, post-operative hematoma formation that can result in neurological compromise and the need for urgent/emergent re-operation. Risk of deep venous thrombosis (DVT) and the need for additional treatment.  Goal of surgery: Reduce (not eliminate) pain, and improve quality of life.   After Rolena Infante did come in and discuss the surgery with the patient.  He did advise the patient to be in contact with her pain management provider before and after surgical intervention.  We will not be taking over pain management for this patient.  We are hopeful to discharge the same day as surgical intervention.  All questions elicited and answered  Ronette Deter, PAC for Melina Schools, MD [facility] [phone]  Patient's clinical exam is essentially unchanged since her last visit from 07/25/2018 in my office.  She continues to have bilateral lower extremity sensation of numbness as well as significant lower thoracic upper lumbar pain.  I informed her that the goal of today's surgery is to reduce her thoracolumbar pain.  The fracture is not the cause of the lower extremity numbness and the etiology of that is unknown.  At this point time I have reviewed the case risks and benefits with the patient and her family and all their questions were encouraged and addressed.  Plan on a T12 kyphoplasty.  Patient can be discharged home later today with appropriate follow-up.  Patient has a pain management provider who will be responsible for continuing her pain medical management.  Patient states that she is currently out of all pain meds and I recommended Tylenol as if I provide her with medications this may no her pain management  agreement.  I told her and her mother that I thought it would be okay for her to travel to [facility] to see her pain management provider tomorrow to get an appropriate refill of medications if needed.

## 2018-07-26 NOTE — Progress Notes (Signed)
Anesthesia Chart Review:  Case:  [id number] Date/Time:  08/02/18 1145   Procedure:  KYPHOPLASTY T12 (N/A ) - 60 mins   Anesthesia type:  General   Pre-op diagnosis:  T12 compression fracture   Location:  [facility] ROOM 04 / [facility]   Surgeon:  Melina Schools, MD      DISCUSSION: Patient is a 32 year old female scheduled for the above procedure.   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 - ED visit 07/10/18 for reported syncope while getting back xrays at an outside clinic. HR was in the 50's BP 100/58. She did not appear dehydrated. No acute SOB or chest pain to suspect PE. Labs unremarkable, pregnancy test negative. She was cautioned about synergistic effects of her medications. Advised further out-patient evaluation to see if additional cardiac testing required.  She was seen by her PCP Dr. Wolfgang Phoenix on 07/13/18 for preoperative evaluation and ED follow-up. Bradycardia had resolved. He did not think her PFO history was contributing to her symptoms. He notes that she is for a cardiology evaluation (08/21/18)with echo (08/16/18) in September, but did not feel these pending encounters would prevent her from having her kyphoplasty. He added, "The patient does have a history of falls as well as pseudoseizures but I do not feel that these will preclude her ability to have her surgery."  Of note, psychiatry note from 07/06/17 ([place]) indicate her pseudoseizure history began ~ 2015 "after a traumatic experience in which the patient had failed epidural anesthesia (left side only) during efforts to stop postpartum bleeding. Since that time the patient has been unable to return to work and has been disabled by intermittent  EEG confirmed pseudoseizures, chronic pain involving her head and neck lower back and her left side, and periods of bilateral lower extremity weakness in which she has depended on a wheelchair." Her family brought her to [facility] at that time following a 9 hour period of intermittent pseudoseizures, and was voluntarily admitted to psychiatry. Weeks prior to that she had a video EEG with [facility] (and evaluation by NeuroHospitalist Amie Portland, MD) with results consistent with PNES. She continues to see Dr. Harrington Challenger at [facility].    She has medical clearance from her PCP. She had a possible syncopal episode earlier this month with mild bradycardia in the 50's. She has a known small PFO by 2015 echo and predominantly SR-ST by Holter then. Dr. Wolfgang Phoenix is referring her back to cardiology, but did not think evaluation or other testing had to take place prior to surgery. If no acute changes then I anticipate that she can proceed as planned. Discussed with anesthesiologist Lillia Abed, MD. She is for urine pregnancy test on the day of surgery.    VS: BP 112/67   Pulse 88   Temp 36.4 C (Oral)   Resp 16   Ht 5' 6" (1.676 m)   Wt 80.8 kg   SpO2 100%   BMI 28.74 kg/m   PROVIDERS: Kathyrn Drown, MD is PCP. Last visit 07/13/18.  She is scheduled to see cardiologist Kate Sable, MD on 08/21/17 ([place]). She saw Dorris Carnes, MD in 2015 for palpitations.  [name], MD is psychiatrist.    LABS: Labs reviewed: Acceptable for surgery. (all labs ordered are listed,  but only abnormal results are displayed)  Labs Reviewed  CBC - Abnormal; Notable for the following components:      Result Value   WBC 3.8 (*)    RBC 3.74 (*)    All other components within normal limits  COMPREHENSIVE METABOLIC PANEL - Abnormal; Notable for the following components:   Glucose, Bld 100 (*)    Total Protein 6.2 (*)    All other components within normal limits  SURGICAL PCR SCREEN      IMAGES: CXR 01/11/18: IMPRESSION: No radiographic evidence of acute cardiopulmonary disease.   OTHER:  Overnight EEG with video 06/12/17: Interpretation: - This continuous EEG is normal. No epileptiform discharges, focal or lateralizing signs are seen. There Is no evidence of encephalopathy. - Four paroxysmal events are recorded which are psychogenic non-epileptic spells (PNES) based on classic semiology and lack of EEG correlate.    EKG: 07/10/18: SR, probable left atrial enlargement. IVCD, consider atypical right BBB. (She does appear to have P waves, but many are flat on this tracing.)    CV: Echo 03/04/14: Study Conclusions Left ventricle: The cavity size was normal. Wall thickness was normal. Systolic function was normal. The estimated ejection fraction was in the range of 55% to 60%. Impressions: - With injection of agitated saline there were few bubbles seen in L sided chambers consistent with very small R to L shunt, most likely at atrial level (PFO) Not seen by color doppler. (Report reviewed by Dorris Carnes, MD, who wrote, "Normal LV and RV size and function Small R to L shunt, probable PFO Nothing to do now.")  48 Hour Holter monitor 316/15-3/18/15:  Conclusions: Sinus rhythm and sinus tachycardia. Possible runs of SVT. Very few isolated PVCs.    Past Medical History:  Diagnosis Date  . ADD (attention deficit disorder)   . Anemia 2011   2o to GASTRIC BYPASS  . Anginal pain ([place])   . Anxiety   . Blood transfusion without reported diagnosis   . Chronic daily headache   . Depression   . Depression   . Dysmenorrhea 09/18/2013  . Dysrhythmia   . Elevated liver enzymes JUL 2011ALK PHOS 111-127 AST  143-267 ALT  213-321T BILI 0.6  ALB  3.7-4.06 Jun 2011 ALK PHOS 118 AST 24 ALT 42 T BILI 0.4 ALB 3.9  . Encounter for drug rehabilitation    at behavioral health for opioid addiction about 4 years ago  . Family history of adverse reaction to anesthesia     'dad had to be kept on pump for breathing for morphine'  . Fatty liver   . Fibroid 01/18/2017  . Fibromyalgia   . Gastric bypass status for obesity   . Gastritis JULY 2011  . Heart murmur   . 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 History of Holter monitoring   . Hx of opioid abuse    for about 4 years, about 4 years ago  . Interstitial cystitis   . Iron deficiency anemia 07/23/2010  . Irritable bowel syndrome 2012 DIARRHEA   JUN 2012 TTG IgA 14.9  . IUD FEB 2010  . Lupus ([place])   . Menorrhagia 07/18/2013  . Migraines   . Obesity (BMI 30-39.9) 2011 228 LBS BMI 36.8  . Ovarian cyst   . Patient desires pregnancy 09/18/2013  . Polyneuropathy   . PONV (postoperative nausea and vomiting) seizure post-operatively   seizure following ankle surgery  . Potassium (K) deficiency   . Pregnant 12/25/2013  .  Psychiatric pseudoseizure   . RLQ abdominal pain 07/18/2013  . Sciatica of left side 11/14/2014  . Seizures ([place]) 07/31/2014   non-epileptic  . Stress 09/18/2013    Past Surgical History:  Procedure Laterality Date  . BIOPSY  04/10/2015   Procedure: BIOPSY;  Surgeon: Daneil Dolin, MD;  Location: [facility] ORS;  Service: Endoscopy;;  . cath self every nite     for sodium bicarb injection (discontinued 2013)  . CHOLECYSTECTOMY  2005   biliary dyskinesia  . COLONOSCOPY  JUN 2012 ABD PN/DIARRHEA WITH PROPOFOL   NL COLON  . DILATION AND CURETTAGE OF UTERUS    . DILITATION & CURRETTAGE/HYSTROSCOPY WITH NOVASURE ABLATION N/A 03/24/2017   Procedure: DILATATION & CURETTAGE/HYSTEROSCOPY WITH NOVASURE ENDOMETRIAL ABLATION;  Surgeon: Jonnie Kind, MD;  Location: [facility] ORS;  Service: Gynecology;  Laterality: N/A;  . ESOPHAGEAL DILATION N/A 04/10/2015   Procedure: ESOPHAGEAL DILATION WITH 54FR MALONEY DILATOR;  Surgeon: Daneil Dolin, MD;  Location: [facility] ORS;  Service: Endoscopy;  Laterality: N/A;  . ESOPHAGOGASTRODUODENOSCOPY    . ESOPHAGOGASTRODUODENOSCOPY (EGD) WITH PROPOFOL N/A  04/10/2015   Procedure: ESOPHAGOGASTRODUODENOSCOPY (EGD) WITH PROPOFOL;  Surgeon: Daneil Dolin, MD;  Location: [facility];  Service: Endoscopy;  Laterality: N/A;  . ESOPHAGOGASTRODUODENOSCOPY (EGD) WITH PROPOFOL N/A 12/06/2016   Procedure: ESOPHAGOGASTRODUODENOSCOPY (EGD) WITH PROPOFOL;  Surgeon: Danie Binder, MD;  Location: [facility] ENDO SUITE;  Service: Endoscopy;  Laterality: N/A;  . GAB  2007   in [place]-POUCH 5 CM  . GASTRIC BYPASS  06/2006  . HYSTEROSCOPY W/D&C N/A 09/12/2014   Procedure: DILATATION AND CURETTAGE /HYSTEROSCOPY;  Surgeon: Jonnie Kind, MD;  Location: [facility];  Service: Gynecology;  Laterality: N/A;  . LAPAROSCOPIC TUBAL LIGATION Bilateral 03/24/2017   Procedure: LAPAROSCOPIC TUBAL LIGATION ([place] Rings);  Surgeon: Jonnie Kind, MD;  Location: [facility];  Service: Gynecology;  Laterality: Bilateral;  . REPAIR VAGINAL CUFF N/A 07/30/2014   Procedure: REPAIR VAGINAL CUFF;  Surgeon: Mora Bellman, MD;  Location: [place] ORS;  Service: Gynecology;  Laterality: N/A;  . SAVORY DILATION  06/20/2012   Dr. Barnie Alderman gastritis/Ulcer in the mid jejunum. Empiric dilation.   . SURAL NERVE BX Left 02/25/2016   Procedure: LEFT SURAL NERVE BIOPSY;  Surgeon: Jovita Gamma, MD;  Location: [place] NEURO ORS;  Service: Neurosurgery;  Laterality: Left;  Left sural nerve biopsy  . TONSILLECTOMY    . TONSILLECTOMY AND ADENOIDECTOMY    . UPPER GASTROINTESTINAL ENDOSCOPY  JULY 2011 NAUSEA-D125,V6, PH 25   Bx; GASTRITIS, POUCH-5 CM LONG  . WISDOM TOOTH EXTRACTION      MEDICATIONS: . acetaminophen (TYLENOL) 500 MG tablet  . ADDERALL XR 30 MG 24 hr capsule  . carbamazepine (TEGRETOL) 200 MG tablet  . cariprazine (VRAYLAR) capsule  . cariprazine (VRAYLAR) capsule  . clonazePAM (KLONOPIN) 0.5 MG tablet  . cyclobenzaprine (FLEXERIL) 10 MG tablet  . Docusate Sodium (COLACE PO)  . DULoxetine (CYMBALTA) 60 MG capsule  . HYDROcodone Bitartrate ER (HYSINGLA ER) 30 MG T24A  . ibuprofen (ADVIL,MOTRIN) 200 MG  tablet  . Multiple Vitamins-Iron (MULTIVITAMIN/IRON PO)  . Oxycodone HCl 10 MG TABS  . pantoprazole (PROTONIX) 40 MG tablet  . phenazopyridine (PYRIDIUM) 200 MG tablet  . PROAIR HFA 108 (90 Base) MCG/ACT inhaler  . promethazine (PHENERGAN) 25 MG tablet  . topiramate (TOPAMAX) 50 MG tablet  . valACYclovir (VALTREX) 500 MG tablet   No current facility-administered medications for this encounter.     Myra Gianotti, PA-C [facility] Short Stay Center/Anesthesiology Phone [phone])  [phone] 07/26/2018 3:18 PM

## 2018-08-02 ENCOUNTER — Ambulatory Visit (HOSPITAL_COMMUNITY): Payer: Worker's Compensation

## 2018-08-02 ENCOUNTER — Other Ambulatory Visit: Payer: Self-pay

## 2018-08-02 ENCOUNTER — Ambulatory Visit (HOSPITAL_COMMUNITY): Payer: Worker's Compensation | Admitting: Vascular Surgery

## 2018-08-02 ENCOUNTER — Observation Stay (HOSPITAL_COMMUNITY)
Admission: RE | Admit: 2018-08-02 | Discharge: 2018-08-03 | Disposition: A | Discharge status: Home / Self Care | Payer: Worker's Compensation | Source: Ambulatory Visit | Attending: Orthopedic Surgery | Admitting: Orthopedic Surgery

## 2018-08-02 ENCOUNTER — Encounter (HOSPITAL_COMMUNITY)
Admission: RE | Disposition: A | Discharge status: Home / Self Care | Payer: Self-pay | Source: Ambulatory Visit | Attending: Orthopedic Surgery

## 2018-08-02 ENCOUNTER — Encounter (HOSPITAL_COMMUNITY): Payer: Self-pay

## 2018-08-02 DIAGNOSIS — Z9884 Bariatric surgery status: Secondary | ICD-10-CM | POA: Diagnosis not present

## 2018-08-02 DIAGNOSIS — F329 Major depressive disorder, single episode, unspecified: Secondary | ICD-10-CM | POA: Insufficient documentation

## 2018-08-02 DIAGNOSIS — F419 Anxiety disorder, unspecified: Secondary | ICD-10-CM | POA: Insufficient documentation

## 2018-08-02 DIAGNOSIS — Z888 Allergy status to other drugs, medicaments and biological substances status: Secondary | ICD-10-CM | POA: Insufficient documentation

## 2018-08-02 DIAGNOSIS — I10 Essential (primary) hypertension: Secondary | ICD-10-CM | POA: Diagnosis not present

## 2018-08-02 DIAGNOSIS — M4854XA Collapsed vertebra, not elsewhere classified, thoracic region, initial encounter for fracture: Secondary | ICD-10-CM | POA: Diagnosis not present

## 2018-08-02 DIAGNOSIS — M329 Systemic lupus erythematosus, unspecified: Secondary | ICD-10-CM | POA: Insufficient documentation

## 2018-08-02 DIAGNOSIS — Z9889 Other specified postprocedural states: Secondary | ICD-10-CM

## 2018-08-02 DIAGNOSIS — R2689 Other abnormalities of gait and mobility: Secondary | ICD-10-CM | POA: Diagnosis not present

## 2018-08-02 DIAGNOSIS — K589 Irritable bowel syndrome without diarrhea: Secondary | ICD-10-CM | POA: Diagnosis not present

## 2018-08-02 DIAGNOSIS — G629 Polyneuropathy, unspecified: Secondary | ICD-10-CM | POA: Diagnosis not present

## 2018-08-02 DIAGNOSIS — M797 Fibromyalgia: Secondary | ICD-10-CM | POA: Insufficient documentation

## 2018-08-02 DIAGNOSIS — Z79899 Other long term (current) drug therapy: Secondary | ICD-10-CM | POA: Insufficient documentation

## 2018-08-02 DIAGNOSIS — Z419 Encounter for procedure for purposes other than remedying health state, unspecified: Secondary | ICD-10-CM

## 2018-08-02 HISTORY — PX: KYPHOPLASTY: SHX5884

## 2018-08-02 LAB — POCT PREGNANCY, URINE: Preg Test, Ur: NEGATIVE

## 2018-08-02 SURGERY — KYPHOPLASTY
Anesthesia: General | Site: Back

## 2018-08-02 MED ORDER — HYDROCODONE-ACETAMINOPHEN 7.5-325 MG PO TABS: 1.0000 | ORAL_TABLET | Freq: Once | ORAL | Status: DC | PRN

## 2018-08-02 MED ORDER — DOCUSATE SODIUM 100 MG PO CAPS
100.0000 mg | ORAL_CAPSULE | Freq: Two times a day (BID) | ORAL | Status: DC
  Administered 2018-08-02 – 2018-08-03 (×2): 100 mg via ORAL
  Filled 2018-08-02 (×3): qty 1

## 2018-08-02 MED ORDER — POLYETHYLENE GLYCOL 3350 17 G PO PACK: 17.0000 g | PACK | Freq: Every day | ORAL | Status: DC | PRN

## 2018-08-02 MED ORDER — ACETAMINOPHEN 325 MG PO TABS
650.0000 mg | ORAL_TABLET | ORAL | Status: DC | PRN
  Administered 2018-08-02 – 2018-08-03 (×2): 650 mg via ORAL
  Filled 2018-08-02 (×2): qty 2

## 2018-08-02 MED ORDER — ACETAMINOPHEN 10 MG/ML IV SOLN
INTRAVENOUS | Status: AC
  Filled 2018-08-02: qty 100

## 2018-08-02 MED ORDER — CARBAMAZEPINE 200 MG PO TABS
400.0000 mg | ORAL_TABLET | Freq: Every day | ORAL | Status: DC
  Administered 2018-08-02: 400 mg via ORAL
  Filled 2018-08-02 (×2): qty 2

## 2018-08-02 MED ORDER — ROCURONIUM BROMIDE 50 MG/5ML IV SOSY
PREFILLED_SYRINGE | INTRAVENOUS | Status: DC | PRN
  Administered 2018-08-02: 40 mg via INTRAVENOUS

## 2018-08-02 MED ORDER — LIDOCAINE 2% (20 MG/ML) 5 ML SYRINGE
INTRAMUSCULAR | Status: DC | PRN
  Administered 2018-08-02: 60 mg via INTRAVENOUS
  Administered 2018-08-02: 40 mg via INTRAVENOUS

## 2018-08-02 MED ORDER — ALBUTEROL SULFATE (2.5 MG/3ML) 0.083% IN NEBU: 3.0000 mL | INHALATION_SOLUTION | Freq: Four times a day (QID) | RESPIRATORY_TRACT | Status: DC | PRN

## 2018-08-02 MED ORDER — HYDROMORPHONE HCL 1 MG/ML IJ SOLN
0.2500 mg | INTRAMUSCULAR | Status: DC | PRN
  Administered 2018-08-02: 0.5 mg via INTRAVENOUS

## 2018-08-02 MED ORDER — PHENOL 1.4 % MT LIQD: 1.0000 | OROMUCOSAL | Status: DC | PRN

## 2018-08-02 MED ORDER — ONDANSETRON HCL 4 MG/2ML IJ SOLN
INTRAMUSCULAR | Status: DC | PRN
  Administered 2018-08-02: 4 mg via INTRAVENOUS

## 2018-08-02 MED ORDER — DULOXETINE HCL 30 MG PO CPEP
120.0000 mg | ORAL_CAPSULE | Freq: Every day | ORAL | Status: DC
  Administered 2018-08-02: 120 mg via ORAL
  Filled 2018-08-02: qty 4

## 2018-08-02 MED ORDER — HYDROCODONE BITARTRATE ER 30 MG PO T24A: 30.0000 mg | EXTENDED_RELEASE_TABLET | Freq: Every day | ORAL | Status: DC

## 2018-08-02 MED ORDER — CEFAZOLIN SODIUM-DEXTROSE 2-4 GM/100ML-% IV SOLN
2.0000 g | Freq: Three times a day (TID) | INTRAVENOUS | Status: AC
  Administered 2018-08-02 (×2): 2 g via INTRAVENOUS
  Filled 2018-08-02 (×2): qty 100

## 2018-08-02 MED ORDER — VALACYCLOVIR HCL 500 MG PO TABS
500.0000 mg | ORAL_TABLET | Freq: Every day | ORAL | Status: DC
  Administered 2018-08-02: 500 mg via ORAL
  Filled 2018-08-02 (×2): qty 1

## 2018-08-02 MED ORDER — PANTOPRAZOLE SODIUM 40 MG PO TBEC
40.0000 mg | DELAYED_RELEASE_TABLET | Freq: Every day | ORAL | Status: DC
  Administered 2018-08-02 – 2018-08-03 (×2): 40 mg via ORAL
  Filled 2018-08-02 (×2): qty 1

## 2018-08-02 MED ORDER — LACTATED RINGERS IV SOLN
INTRAVENOUS | Status: DC
  Administered 2018-08-02: 11:00:00 via INTRAVENOUS

## 2018-08-02 MED ORDER — 0.9 % SODIUM CHLORIDE (POUR BTL) OPTIME
TOPICAL | Status: DC | PRN
  Administered 2018-08-02: 1000 mL

## 2018-08-02 MED ORDER — DOCUSATE SODIUM 50 MG PO CAPS
50.0000 mg | ORAL_CAPSULE | Freq: Every day | ORAL | Status: DC
  Administered 2018-08-02 – 2018-08-03 (×3): 50 mg via ORAL
  Filled 2018-08-02 (×2): qty 1

## 2018-08-02 MED ORDER — MEPERIDINE HCL 50 MG/ML IJ SOLN: 6.2500 mg | INTRAMUSCULAR | Status: DC | PRN

## 2018-08-02 MED ORDER — SUGAMMADEX SODIUM 200 MG/2ML IV SOLN
INTRAVENOUS | Status: DC | PRN
  Administered 2018-08-02: 175 mg via INTRAVENOUS

## 2018-08-02 MED ORDER — BUPIVACAINE-EPINEPHRINE 0.25% -1:200000 IJ SOLN
INTRAMUSCULAR | Status: DC | PRN
  Administered 2018-08-02: 20 mL

## 2018-08-02 MED ORDER — DEXAMETHASONE SODIUM PHOSPHATE 10 MG/ML IJ SOLN
INTRAMUSCULAR | Status: DC | PRN
  Administered 2018-08-02: 10 mg via INTRAVENOUS

## 2018-08-02 MED ORDER — AMPHETAMINE-DEXTROAMPHET ER 30 MG PO CP24: 30.0000 mg | ORAL_CAPSULE | Freq: Every day | ORAL | Status: DC

## 2018-08-02 MED ORDER — FENTANYL CITRATE (PF) 250 MCG/5ML IJ SOLN
INTRAMUSCULAR | Status: AC
  Filled 2018-08-02: qty 5

## 2018-08-02 MED ORDER — LACTATED RINGERS IV SOLN
INTRAVENOUS | Status: DC
  Administered 2018-08-02: 17:00:00 via INTRAVENOUS

## 2018-08-02 MED ORDER — MIDAZOLAM HCL 2 MG/2ML IJ SOLN
INTRAMUSCULAR | Status: AC
  Filled 2018-08-02: qty 2

## 2018-08-02 MED ORDER — HYDROMORPHONE HCL 1 MG/ML IJ SOLN
0.5000 mg | Freq: Once | INTRAMUSCULAR | Status: AC
  Administered 2018-08-02: 0.5 mg via INTRAVENOUS
  Filled 2018-08-02: qty 0.5

## 2018-08-02 MED ORDER — BUPIVACAINE-EPINEPHRINE (PF) 0.25% -1:200000 IJ SOLN
INTRAMUSCULAR | Status: AC
  Filled 2018-08-02: qty 30

## 2018-08-02 MED ORDER — DEXMEDETOMIDINE HCL IN NACL 200 MCG/50ML IV SOLN
INTRAVENOUS | Status: DC | PRN
  Administered 2018-08-02: 16 ug via INTRAVENOUS
  Administered 2018-08-02: 8 ug via INTRAVENOUS

## 2018-08-02 MED ORDER — BUPIVACAINE LIPOSOME 1.3 % IJ SUSP
INTRAMUSCULAR | Status: DC | PRN
  Administered 2018-08-02: 20 mL

## 2018-08-02 MED ORDER — ACETAMINOPHEN 650 MG RE SUPP: 650.0000 mg | RECTAL | Status: DC | PRN

## 2018-08-02 MED ORDER — FENTANYL CITRATE (PF) 100 MCG/2ML IJ SOLN
INTRAMUSCULAR | Status: DC | PRN
  Administered 2018-08-02: 100 ug via INTRAVENOUS
  Administered 2018-08-02 (×2): 50 ug via INTRAVENOUS

## 2018-08-02 MED ORDER — CLONAZEPAM 0.5 MG PO TABS
0.5000 mg | ORAL_TABLET | Freq: Three times a day (TID) | ORAL | Status: DC | PRN
  Administered 2018-08-02 – 2018-08-03 (×3): 0.5 mg via ORAL
  Filled 2018-08-02 (×3): qty 1

## 2018-08-02 MED ORDER — PHENYLEPHRINE 40 MCG/ML (10ML) SYRINGE FOR IV PUSH (FOR BLOOD PRESSURE SUPPORT)
PREFILLED_SYRINGE | INTRAVENOUS | Status: DC | PRN
  Administered 2018-08-02: 80 ug via INTRAVENOUS
  Administered 2018-08-02: 120 ug via INTRAVENOUS

## 2018-08-02 MED ORDER — IOPAMIDOL (ISOVUE-300) INJECTION 61%
INTRAVENOUS | Status: AC
  Filled 2018-08-02: qty 50

## 2018-08-02 MED ORDER — SODIUM CHLORIDE 0.9% FLUSH: 3.0000 mL | Freq: Two times a day (BID) | INTRAVENOUS | Status: DC

## 2018-08-02 MED ORDER — CEFAZOLIN SODIUM-DEXTROSE 2-4 GM/100ML-% IV SOLN
2.0000 g | INTRAVENOUS | Status: AC
  Administered 2018-08-02: 2 g via INTRAVENOUS

## 2018-08-02 MED ORDER — PROMETHAZINE HCL 25 MG PO TABS
25.0000 mg | ORAL_TABLET | Freq: Four times a day (QID) | ORAL | Status: DC | PRN
  Administered 2018-08-02: 25 mg via ORAL
  Filled 2018-08-02: qty 1

## 2018-08-02 MED ORDER — MENTHOL 3 MG MT LOZG: 1.0000 | LOZENGE | OROMUCOSAL | Status: DC | PRN

## 2018-08-02 MED ORDER — PROPOFOL 10 MG/ML IV BOLUS
INTRAVENOUS | Status: DC | PRN
  Administered 2018-08-02: 180 mg via INTRAVENOUS

## 2018-08-02 MED ORDER — PROMETHAZINE HCL 25 MG/ML IJ SOLN: 6.2500 mg | INTRAMUSCULAR | Status: DC | PRN

## 2018-08-02 MED ORDER — MAGNESIUM CITRATE PO SOLN: 1.0000 | Freq: Once | ORAL | Status: DC | PRN

## 2018-08-02 MED ORDER — TOPIRAMATE 25 MG PO TABS
50.0000 mg | ORAL_TABLET | Freq: Every day | ORAL | Status: DC
  Administered 2018-08-03: 50 mg via ORAL
  Filled 2018-08-02: qty 2

## 2018-08-02 MED ORDER — PROPOFOL 10 MG/ML IV BOLUS
INTRAVENOUS | Status: AC
Start: 2018-08-02 — End: ?
  Filled 2018-08-02: qty 20

## 2018-08-02 MED ORDER — CARIPRAZINE HCL 1.5 MG PO CAPS: 1.5000 mg | ORAL_CAPSULE | Freq: Every day | ORAL | Status: DC

## 2018-08-02 MED ORDER — ACETAMINOPHEN 10 MG/ML IV SOLN
1000.0000 mg | Freq: Once | INTRAVENOUS | Status: AC
  Administered 2018-08-02: 1000 mg via INTRAVENOUS

## 2018-08-02 MED ORDER — BUPIVACAINE LIPOSOME 1.3 % IJ SUSP
20.0000 mL | INTRAMUSCULAR | Status: DC
  Filled 2018-08-02: qty 20

## 2018-08-02 MED ORDER — SODIUM CHLORIDE 0.9 % IV SOLN: 250.0000 mL | INTRAVENOUS | Status: DC

## 2018-08-02 MED ORDER — OXYCODONE HCL 5 MG PO TABS
10.0000 mg | ORAL_TABLET | Freq: Four times a day (QID) | ORAL | Status: DC | PRN
  Administered 2018-08-02 – 2018-08-03 (×4): 10 mg via ORAL
  Filled 2018-08-02 (×4): qty 2

## 2018-08-02 MED ORDER — IOPAMIDOL (ISOVUE-300) INJECTION 61%
INTRAVENOUS | Status: DC | PRN
  Administered 2018-08-02: 50 mL

## 2018-08-02 MED ORDER — TOPIRAMATE 100 MG PO TABS
100.0000 mg | ORAL_TABLET | Freq: Every day | ORAL | Status: DC
  Administered 2018-08-02: 100 mg via ORAL
  Filled 2018-08-02 (×2): qty 1

## 2018-08-02 MED ORDER — SODIUM CHLORIDE 0.9% FLUSH: 3.0000 mL | INTRAVENOUS | Status: DC | PRN

## 2018-08-02 MED ORDER — CEFAZOLIN SODIUM-DEXTROSE 2-4 GM/100ML-% IV SOLN
INTRAVENOUS | Status: AC
  Filled 2018-08-02: qty 100

## 2018-08-02 MED ORDER — ACETAMINOPHEN 10 MG/ML IV SOLN: 1000.0000 mg | Freq: Once | INTRAVENOUS | Status: DC | PRN

## 2018-08-02 MED ORDER — HYDROMORPHONE HCL 1 MG/ML IJ SOLN
INTRAMUSCULAR | Status: AC
  Filled 2018-08-02: qty 1

## 2018-08-02 MED ORDER — MIDAZOLAM HCL 5 MG/5ML IJ SOLN
INTRAMUSCULAR | Status: DC | PRN
  Administered 2018-08-02: 2 mg via INTRAVENOUS

## 2018-08-02 SURGICAL SUPPLY — 47 items
ADH SKN CLS APL DERMABOND .7 (GAUZE/BANDAGES/DRESSINGS) ×1
ADH SKN CLS LQ APL DERMABOND (GAUZE/BANDAGES/DRESSINGS) ×1
BANDAGE ADH SHEER 1  50/CT (GAUZE/BANDAGES/DRESSINGS) ×4 IMPLANT
BLADE SURG 15 STRL LF DISP TIS (BLADE) ×1 IMPLANT
BLADE SURG 15 STRL SS (BLADE) ×2
BONE FILLER DEVICE STRL SZ3 (INSTRUMENTS) IMPLANT
CEMENT KYPHON CX01A KIT/MIXER (Cement) ×2 IMPLANT
COVER LIGHT HANDLE  DEROYL (MISCELLANEOUS) ×2 IMPLANT
CURETTE EXPRESS SZ2 7MM (INSTRUMENTS) IMPLANT
CURETTE WEDGE 8.5MM KYPHX (MISCELLANEOUS) IMPLANT
CURRETTE EXPRESS SZ2 7MM (INSTRUMENTS) ×2
DERMABOND ADHESIVE PROPEN (GAUZE/BANDAGES/DRESSINGS) ×1
DERMABOND ADVANCED (GAUZE/BANDAGES/DRESSINGS) ×1
DERMABOND ADVANCED .7 DNX12 (GAUZE/BANDAGES/DRESSINGS) ×1 IMPLANT
DERMABOND ADVANCED .7 DNX6 (GAUZE/BANDAGES/DRESSINGS) IMPLANT
DRAPE C-ARM 42X72 X-RAY (DRAPES) ×4 IMPLANT
DRAPE INCISE IOBAN 66X45 STRL (DRAPES) ×2 IMPLANT
DRAPE LAPAROTOMY T 102X78X121 (DRAPES) ×2 IMPLANT
DRAPE SURG 17X23 STRL (DRAPES) ×2 IMPLANT
DRAPE U-SHAPE 47X51 STRL (DRAPES) ×2 IMPLANT
DRAPE WARM FLUID 44X44 (DRAPE) ×2 IMPLANT
DURAPREP 26ML APPLICATOR (WOUND CARE) ×2 IMPLANT
GLOVE BIO SURGEON STRL SZ 6.5 (GLOVE) ×2 IMPLANT
GLOVE BIOGEL PI IND STRL 6.5 (GLOVE) ×1 IMPLANT
GLOVE BIOGEL PI IND STRL 8.5 (GLOVE) ×1 IMPLANT
GLOVE BIOGEL PI INDICATOR 6.5 (GLOVE) ×1
GLOVE BIOGEL PI INDICATOR 8.5 (GLOVE) ×1
GLOVE SS BIOGEL STRL SZ 8.5 (GLOVE) ×1 IMPLANT
GLOVE SUPERSENSE BIOGEL SZ 8.5 (GLOVE) ×1
GOWN STRL REUS W/ TWL LRG LVL3 (GOWN DISPOSABLE) ×2 IMPLANT
GOWN STRL REUS W/TWL 2XL LVL3 (GOWN DISPOSABLE) ×2 IMPLANT
GOWN STRL REUS W/TWL LRG LVL3 (GOWN DISPOSABLE) ×4
KIT BASIN OR (CUSTOM PROCEDURE TRAY) ×2 IMPLANT
KIT TURNOVER KIT B (KITS) ×2 IMPLANT
NDL SPNL 18GX3.5 QUINCKE PK (NEEDLE) ×2 IMPLANT
NEEDLE 21X1 OR PACK (NEEDLE) ×2 IMPLANT
NEEDLE SPNL 18GX3.5 QUINCKE PK (NEEDLE) ×4 IMPLANT
NS IRRIG 1000ML POUR BTL (IV SOLUTION) ×2 IMPLANT
PACK SURGICAL SETUP 50X90 (CUSTOM PROCEDURE TRAY) ×2 IMPLANT
PACK UNIVERSAL I (CUSTOM PROCEDURE TRAY) ×2 IMPLANT
PAD ARMBOARD 7.5X6 YLW CONV (MISCELLANEOUS) ×4 IMPLANT
SPONGE LAP 4X18 RFD (DISPOSABLE) ×2 IMPLANT
SUT MNCRL AB 3-0 PS2 18 (SUTURE) ×2 IMPLANT
SYR CONTROL 10ML LL (SYRINGE) ×2 IMPLANT
TOWEL OR 17X26 10 PK STRL BLUE (TOWEL DISPOSABLE) ×2 IMPLANT
TRAY KYPHOPAK 15/3 ONESTEP 1ST (MISCELLANEOUS) ×1 IMPLANT
WATER STERILE IRR 1000ML POUR (IV SOLUTION) ×2 IMPLANT

## 2018-08-02 NOTE — Evaluation (Signed)
Physical Therapy Evaluation Patient Details Name: Penny [name] MRN: [id number] DOB: 01-17-1986 Today's Date: 08/02/2018   History of Present Illness  Pt is a 32 y/o female s/p T12 Kyphoplasty secondary to compression fx. PMH includes seizures, migrain, lupus, opiod abuse, fibromyalgia, and s/p gastric bypass.   Clinical Impression  Patient is s/p above surgery resulting in the deficits listed below (see PT Problem List). Pt very guarded throughout gait. Pt requiring supervision to min guard for mobility. Educated about back precautions and generalized walking program. Patient will benefit from skilled PT to increase their independence and safety with mobility (while adhering to their precautions) to allow discharge to the venue listed below.     Follow Up Recommendations No PT follow up;Supervision for mobility/OOB    Equipment Recommendations  None recommended by PT    Recommendations for Other Services       Precautions / Restrictions Precautions Precautions: Back Precaution Booklet Issued: Yes (comment) Precaution Comments: Reviewed back precautions with pt.  Restrictions Weight Bearing Restrictions: No      Mobility  Bed Mobility Overal bed mobility: Needs Assistance Bed Mobility: Rolling;Sidelying to Sit;Sit to Sidelying Rolling: Supervision Sidelying to sit: Supervision     Sit to sidelying: Supervision General bed mobility comments: Supervision for safety. Increased time required secondary to pain. Educated about use of log roll technique.   Transfers Overall transfer level: Needs assistance Equipment used: None Transfers: Sit to/from Stand Sit to Stand: Supervision         General transfer comment: Supervision for safety. Increased time required. Cues to power through LEs.   Ambulation/Gait Ambulation/Gait assistance: Supervision;Min guard Gait Distance (Feet): 200 Feet Assistive device: IV Pole Gait Pattern/deviations: Step-through pattern;Decreased  stride length Gait velocity: Decreased    General Gait Details: Slow, very guarded gait. Use of IV pole for steadying. Min guard to supervision for safety. Educated about generalized walking program to perform at home.   Stairs            Wheelchair Mobility    Modified Rankin (Stroke Patients Only)       Balance Overall balance assessment: Needs assistance Sitting-balance support: No upper extremity supported;Feet supported Sitting balance-Leahy Scale: Good     Standing balance support: Single extremity supported;No upper extremity supported;During functional activity Standing balance-Leahy Scale: Fair Standing balance comment: Able to maintain static standing balance without UE support.                              Pertinent Vitals/Pain Pain Assessment: Faces Faces Pain Scale: Hurts even more Pain Location: back  Pain Descriptors / Indicators: Aching;Operative site guarding Pain Intervention(s): Limited activity within patient's tolerance;Monitored during session;Repositioned    Home Living Family/patient expects to be discharged to:: Private residence Living Arrangements: Parent;Children Available Help at Discharge: Family;Available 24 hours/day Type of Home: House Home Access: Stairs to enter Entrance Stairs-Rails: None Entrance Stairs-Number of Steps: 2 Home Layout: Two level Home Equipment: None      Prior Function Level of Independence: Independent               Hand Dominance        Extremity/Trunk Assessment   Upper Extremity Assessment Upper Extremity Assessment: Defer to OT evaluation    Lower Extremity Assessment Lower Extremity Assessment: Overall WFL for tasks assessed    Cervical / Trunk Assessment Cervical / Trunk Assessment: Other exceptions Cervical / Trunk Exceptions: s/p kyphoplasty   Communication  Communication: No difficulties  Cognition Arousal/Alertness: Awake/alert Behavior During Therapy: WFL for  tasks assessed/performed Overall Cognitive Status: Within Functional Limits for tasks assessed                                        General Comments General comments (skin integrity, edema, etc.): Pt's mom present during session.     Exercises     Assessment/Plan    PT Assessment Patient needs continued PT services  PT Problem List Decreased mobility;Decreased activity tolerance;Decreased knowledge of precautions;Pain       PT Treatment Interventions Gait training;Stair training;Functional mobility training;Therapeutic activities;Therapeutic exercise;Patient/family education    PT Goals (Current goals can be found in the Care Plan section)  Acute Rehab PT Goals Patient Stated Goal: to go home  PT Goal Formulation: With patient Time For Goal Achievement: 08/16/18 Potential to Achieve Goals: Good    Frequency Min 5X/week   Barriers to discharge        Co-evaluation               AM-PAC PT "6 Clicks" Daily Activity  Outcome Measure Difficulty turning over in bed (including adjusting bedclothes, sheets and blankets)?: A Little Difficulty moving from lying on back to sitting on the side of the bed? : A Little Difficulty sitting down on and standing up from a chair with arms (e.g., wheelchair, bedside commode, etc,.)?: A Little Help needed moving to and from a bed to chair (including a wheelchair)?: A Little Help needed walking in hospital room?: A Little Help needed climbing 3-5 steps with a railing? : A Little 6 Click Score: 18    End of Session Equipment Utilized During Treatment: Back brace Activity Tolerance: Patient tolerated treatment well Patient left: in bed;with call bell/phone within reach;with family/visitor present Nurse Communication: Mobility status PT Visit Diagnosis: Other abnormalities of gait and mobility (R26.89);Pain Pain - part of body: (back )    Time: 1650-1705 PT Time Calculation (min) (ACUTE ONLY): 15 min   Charges:    PT Evaluation $PT Eval Low Complexity: 1 Low          Leighton Ruff, PT, DPT  Acute Rehabilitation Services  Pager: [phone]   Rudean Hitt 08/02/2018, 6:13 PM

## 2018-08-02 NOTE — Progress Notes (Signed)
Patient complaining of pain but then falls back asleep. Dr. Valma Cava by to see patient, given IV tylenol, precedex, and 0.5mg  dilaudid. Dr. Valma Cava does not want patient to receive any more pain medications in PACU due to history of opioid abuse. Will continue to monitor.

## 2018-08-02 NOTE — Anesthesia Preprocedure Evaluation (Addendum)
Anesthesia Evaluation  Patient identified by MRN, date of birth, ID band Patient awake    Reviewed: Allergy & Precautions, NPO status , Patient's Chart, lab work & pertinent test results  History of Anesthesia Complications (+) PONV and Family history of anesthesia reaction  Airway Mallampati: II  TM Distance: >3 FB Neck ROM: Full    Dental no notable dental hx. (+) Teeth Intact, Dental Advisory Given   Pulmonary Current Smoker,    Pulmonary exam normal breath sounds clear to auscultation       Cardiovascular hypertension, Normal cardiovascular exam+ dysrhythmias  Rhythm:Regular Rate:Normal     Neuro/Psych  Headaches, Seizures -,  PSYCHIATRIC DISORDERS  Neuromuscular disease    GI/Hepatic   Endo/Other    Renal/GU      Musculoskeletal  (+) Fibromyalgia -  Abdominal   Peds  Hematology  (+) anemia ,   Anesthesia Other Findings   Reproductive/Obstetrics                            Anesthesia Physical Anesthesia Plan  ASA: III  Anesthesia Plan: General   Post-op Pain Management:    Induction: Intravenous  PONV Risk Score and Plan: 4 or greater and Treatment may vary due to age or medical condition, Ondansetron, Dexamethasone and Scopolamine patch - Pre-op  Airway Management Planned: Oral ETT  Additional Equipment:   Intra-op Plan:   Post-operative Plan: Extubation in OR  Informed Consent: I have reviewed the patients History and Physical, chart, labs and discussed the procedure including the risks, benefits and alternatives for the proposed anesthesia with the patient or authorized representative who has indicated his/her understanding and acceptance.   Dental advisory given  Plan Discussed with:   Anesthesia Plan Comments:         Anesthesia Quick Evaluation

## 2018-08-02 NOTE — Transfer of Care (Signed)
Immediate Anesthesia Transfer of Care Note  [name]  Procedure(s) Performed: KYPHOPLASTY T12 (N/A Back)  Patient Location: PACU  Anesthesia Type:General  Level of Consciousness: awake, oriented and patient cooperative  Airway & Oxygen Therapy: Patient Spontanous Breathing and Patient connected to nasal cannula oxygen  Post-op Assessment: Report given to RN and Post -op Vital signs reviewed and stable  Post vital signs: Reviewed  Last Vitals:  Vitals Value Taken Time  BP 104/78 08/02/2018  1:24 PM  Temp    Pulse 69 08/02/2018  1:26 PM  Resp 9 08/02/2018  1:26 PM  SpO2 100 % 08/02/2018  1:26 PM  Vitals shown include unvalidated device data.  Last Pain:  Vitals:   08/02/18 0938  TempSrc:   PainSc: 9          Complications: No apparent anesthesia complications

## 2018-08-02 NOTE — Brief Op Note (Signed)
08/02/2018  1:12 PM  PATIENT:  Penny [name]  32 y.o. female  PRE-OPERATIVE DIAGNOSIS:  T12 compression fracture  POST-OPERATIVE DIAGNOSIS:  T12 compression fracture  PROCEDURE:  Procedure(s) with comments: KYPHOPLASTY T12 (N/A) - 60 mins  SURGEON:  Surgeon(s) and Role:    Melina Schools, MD - Primary  PHYSICIAN ASSISTANT:   ASSISTANTS: none   ANESTHESIA:   general  EBL:  minimal   BLOOD ADMINISTERED:none  DRAINS: none   LOCAL MEDICATIONS USED:  MARCAINE    and OTHER exparel  SPECIMEN:  No Specimen  DISPOSITION OF SPECIMEN:  N/A  COUNTS:  YES  TOURNIQUET:  * No tourniquets in log *  DICTATION: .Dragon Dictation  PLAN OF CARE: Admit for overnight observation  PATIENT DISPOSITION:  PACU - hemodynamically stable.

## 2018-08-02 NOTE — Op Note (Signed)
Operative report graft   Preoperative diagnosis: T12 compression fracture  Postoperative diagnosis: Same  Operative procedure: T12 kyphoplasty  Complications: None  First assistant: Ronette Deter, PA  Indications: This is a very pleasant 32 year old woman who was in her usual state of good health until she was unfortunately involved in a work-related injury.  She subsequently developed a T12 fracture.  Spite conservative care consisting of pain medical management, as well as a TLSO brace she continued to deteriorate.  As a result of the ongoing pain we elected to proceed with a T12 kyphoplasty.  All appropriate risks benefits and alternatives were discussed with the patient and his family and consent was obtained  Operative note  Patient was brought the operating room placed by the operating room table.  After successful induction of general anesthesia and endotracheally patient teds SCDs were applied she was turned prone onto the chest and pelvic gel rolls.  The back was then prepped and draped in a standard fashion.  Timeout was then taken to confirm patient procedure and all other important data.  Fluoroscopy views were then obtained one in the lateral the other in the AP first localizing the T12 level and then ensuring at proper position.  Once I visualized the T12 thoracic level in both planes I then marked out the lateral border of the T12 pedicle.  I then made a small stab incision and advanced the Jamshidi needle down to the lateral aspect of the pedicle.  I advanced the Jamshidi needle into the T12 pedicle using a standard transpedicular approach.  As I was nearing the medial wall of the pedicle I confirmed that in the lateral view I was beyond the posterior wall the vertebral body.  Once I confirmed that I had proper trajectory I advanced the Jamshidi needle into the vertebral body.  This was repeated on the contralateral side.  Once I had both pedicles cannulated I then inserted the drill as  well as a curette to create the cavity.  The inflatable Kyphon bone tamps were then placed and inflated.  I had excellent insufflation.  The bone tamps were then deflated and then I inserted the  Cement.  A total of 6 cc (3 cc on each side) was place  There was excellent fill and there was no inferior, posterior, superior, lateral cement leak.  The cement was allowed to cure and the trochars were removed.  The skin edges were cleaned and the wound was closed with a simple 3-0 Monocryl stitch and Dermabond.  Prior to closing I did in infiltrate the surgical area with quarter percent Marcaine with Exparel for postoperative analgesia.  Band-Aids were applied the patient result in the extubated transfer the PACU without incident.  Final x-rays were satisfactory the cement was in the proper position and there was no leak.  At the end of the case all needle sponge counts were correct.  There were no adverse intraoperative events.

## 2018-08-02 NOTE — Anesthesia Postprocedure Evaluation (Signed)
Anesthesia Post Note  [name]  Procedure(s) Performed: KYPHOPLASTY T12 (N/A Back)     Patient location during evaluation: PACU Anesthesia Type: General Level of consciousness: awake and alert Pain management: pain level controlled Vital Signs Assessment: post-procedure vital signs reviewed and stable Respiratory status: spontaneous breathing, nonlabored ventilation, respiratory function stable and patient connected to nasal cannula oxygen Cardiovascular status: blood pressure returned to baseline and stable Postop Assessment: no apparent nausea or vomiting Anesthetic complications: no    Last Vitals:  Vitals:   08/02/18 1528 08/02/18 1554  BP: 96/62 108/67  Pulse: (!) 45 (!) 49  Resp: 10 16  Temp: (!) 36.3 C 36.4 C  SpO2: 100% 100%    Last Pain:  Vitals:   08/02/18 1710  TempSrc:   PainSc: 9                  Barnet Glasgow

## 2018-08-02 NOTE — Anesthesia Procedure Notes (Signed)
Procedure Name: Intubation Date/Time: 08/02/2018 12:24 PM Performed by: Jenne Campus, CRNA Pre-anesthesia Checklist: Patient identified, Emergency Drugs available, Suction available, Patient being monitored and Timeout performed Patient Re-evaluated:Patient Re-evaluated prior to induction Oxygen Delivery Method: Circle system utilized Preoxygenation: Pre-oxygenation with 100% oxygen Induction Type: IV induction Ventilation: Mask ventilation without difficulty Laryngoscope Size: Miller and 3 Grade View: Grade I Tube type: Oral Tube size: 7.0 mm Number of attempts: 1 Airway Equipment and Method: Stylet Placement Confirmation: ETT inserted through vocal cords under direct vision,  positive ETCO2,  CO2 detector and breath sounds checked- equal and bilateral Secured at: 21 cm Dental Injury: Teeth and Oropharynx as per pre-operative assessment  Comments: ETT placed by Violet Baldy

## 2018-08-02 NOTE — Discharge Instructions (Signed)
Balloon Kyphoplasty, Care After °Refer to this sheet in the next few weeks. These instructions provide you with information about caring for yourself after your procedure. Your health care provider may also give you more specific instructions. Your treatment has been planned according to current medical practices, but problems sometimes occur. Call your health care provider if you have any problems or questions after your procedure. °What can I expect after the procedure? °After your procedure, it is common to have back pain. °Follow these instructions at home: °Incision care °· Follow instructions from your health care provider about how to take care of your incisions. Make sure you: °? Wash your hands with soap and water before you change your bandage (dressing). If soap and water are not available, use hand sanitizer. °? Change your dressing as told by your health care provider. °? Leave stitches (sutures), skin glue, or adhesive strips in place. These skin closures may need to be in place for 2 weeks or longer. If adhesive strip edges start to loosen and curl up, you may trim the loose edges. Do not remove adhesive strips completely unless your health care provider tells you to do that. °· Check your incision area every day for signs of infection. Watch for: °? Redness, swelling, or pain. °? Fluid, blood, or pus. °· Keep your dressing dry until your health care provider says that it can be removed. °Activity ° °· Rest your back and avoid intense physical activity for as long as told by your health care provider. °· Return to your normal activities as told by your health care provider. Ask your health care provider what activities are safe for you. °· Do not lift anything that is heavier than 10 lb (4.5 kg). This is about the weight of a gallon of milk. You may need to avoid heavy lifting for several weeks. °General instructions °· Take over-the-counter and prescription medicines only as told by your health care  provider. °· If directed, apply ice to the painful area: °? Put ice in a plastic bag. °? Place a towel between your skin and the bag. °? Leave the ice on for 20 minutes, 2-3 times per day. °· Do not use tobacco products, including cigarettes, chewing tobacco, or e-cigarettes. If you need help quitting, ask your health care provider. °· Keep all follow-up visits as told by your health care provider. This is important. °Contact a health care provider if: °· You have a fever. °· You have redness, swelling, or pain at the site of your incisions. °· You have fluid, blood, or pus coming from your incisions. °· You have pain that gets worse or does not get better with medicine. °· You develop numbness or weakness in any part of your body. °Get help right away if: °· You have chest pain. °· You have difficulty breathing. °· You cannot move your legs. °· You cannot control your bladder or bowel movements. °· You suddenly become weak or numb on one side of your body. °· You become very confused. °· You have trouble speaking or understanding, or both. °This information is not intended to replace advice given to you by your health care provider. Make sure you discuss any questions you have with your health care provider. °Document Released: 08/13/2015 Document Revised: 04/29/2016 Document Reviewed: 03/17/2015 °Elsevier Interactive Patient Education © 2018 [employer] ° °

## 2018-08-03 ENCOUNTER — Telehealth (HOSPITAL_COMMUNITY): Payer: Self-pay | Admitting: *Deleted

## 2018-08-03 ENCOUNTER — Encounter (HOSPITAL_COMMUNITY): Payer: Self-pay | Admitting: Orthopedic Surgery

## 2018-08-03 DIAGNOSIS — M4854XA Collapsed vertebra, not elsewhere classified, thoracic region, initial encounter for fracture: Secondary | ICD-10-CM | POA: Diagnosis not present

## 2018-08-03 NOTE — Telephone Encounter (Signed)
Dr Harrington Challenger  Patient will lose insurance tomorrow after having back surgery. She asked if you would refill her medication'  for 30 days until she can the medicaid straightened out?

## 2018-08-03 NOTE — Telephone Encounter (Signed)
I cannont send them electronically because she has been admitted and the chart is locked.You can call the pharmacy and see if she is due for refillsbut controlled drugs cannot be refilled early

## 2018-08-03 NOTE — Progress Notes (Signed)
    Subjective: 1 Day Post-Op Procedure(s) (LRB): KYPHOPLASTY T12 (N/A) Patient reports pain as 9 on 0-10 scale.   Denies CP or SOB.  Voiding without difficulty. Positive flatus. Objective: Vital signs in last 24 hours: Temp:  [97.3 F (36.3 C)-98.6 F (37 C)] 98.3 F (36.8 C) (08/29 0344) Pulse Rate:  [44-74] 58 (08/29 0344) Resp:  [10-19] 18 (08/29 0344) BP: (93-114)/(54-78) 93/54 (08/29 0344) SpO2:  [97 %-100 %] 97 % (08/29 0344) Weight:  [80.8 kg] 80.8 kg (08/28 1602)  Intake/Output from previous day: 08/28 0701 - 08/29 0700 In: 542.3 [I.V.:442.3; IV Piggyback:100] Out: 5 [Blood:5] Intake/Output this shift: No intake/output data recorded.  Labs: No results for input(s): HGB in the last 72 hours. No results for input(s): WBC, RBC, HCT, PLT in the last 72 hours. No results for input(s): NA, K, CL, CO2, BUN, CREATININE, GLUCOSE, CALCIUM in the last 72 hours. No results for input(s): LABPT, INR in the last 72 hours.  Physical Exam: Neurologically intact ABD soft Sensation intact distally Dorsiflexion/Plantar flexion intact Incision: scant drainage Compartment soft Body mass index is 28.74 kg/m.   Assessment/Plan: 1 Day Post-Op Procedure(s) (LRB): KYPHOPLASTY T12 (N/A) Advance diet Up with therapy  Pt may Dc after cleared by PT Pt is in Pain management.  No pain medication provided.  She will continue to take her prescribed pain meds  Dsean Vantol, Darla Lesches for Dr. Melina Schools [facility] [phone] 08/03/2018, 7:26 AM

## 2018-08-03 NOTE — Progress Notes (Signed)
Patient is discharged from room 3C11 at this time. Alert and in stable condition. IV site d/c'd and instructions read to patient and friend with understanding verbalized. Left unit via wheelchair with all belongings at side.

## 2018-08-03 NOTE — Progress Notes (Signed)
Physical Therapy Treatment Patient Details Name: Penny [name] MRN: [id number] DOB: 17-Jun-1986 Today's Date: 08/03/2018    History of Present Illness Pt is a 32 y/o female s/p T12 Kyphoplasty secondary to compression fx. PMH includes seizures, migrain, lupus, opiod abuse, fibromyalgia, and s/p gastric bypass.     PT Comments    Pt continues to make steady progress towards goals, performing ambulation with gross supervision. Pt educated on precautions, brace management, and generalized safety with mobility. Not recommending an AD at d/c as pt demonstrated safety without use of one. Will continue to follow acutely and progress as able.    Follow Up Recommendations  No PT follow up;Supervision for mobility/OOB     Equipment Recommendations  None recommended by PT    Recommendations for Other Services       Precautions / Restrictions Precautions Precautions: Back Precaution Booklet Issued: Yes (comment) Precaution Comments: Reviewed back precautions with pt.  Restrictions Weight Bearing Restrictions: No    Mobility  Bed Mobility Overal bed mobility: Needs Assistance Bed Mobility: Rolling;Sidelying to Sit;Sit to Sidelying Rolling: Modified independent (Device/Increase time) Sidelying to sit: Supervision     Sit to sidelying: Supervision General bed mobility comments: Supervision for safety. Min cues for log roll sequence. Increased time needed to perform  Transfers Overall transfer level: Needs assistance Equipment used: None Transfers: Sit to/from Stand Sit to Stand: Supervision         General transfer comment: Supervision for safety and technique. Pt donned brace in standing with reports taht she can not put the brace on in sitting due to discomfort with stomach. Strongly encouraged pt to don/doff brace in sitting. After donning brace at end of session in sitting, instructed pt on brace adjustment.  Ambulation/Gait Ambulation/Gait assistance: Supervision Gait  Distance (Feet): 300 Feet Assistive device: None Gait Pattern/deviations: Step-through pattern Gait velocity: Decreased  Gait velocity interpretation: <1.31 ft/sec, indicative of household ambulator General Gait Details: Pt initially slow and guarded but increased gait speed throughout session.  Supervision for safety. Min cues for maintence of precautions with gait.    Stairs Stairs: Yes Stairs assistance: Min guard;Min assist Stair Management: One rail Left;Step to pattern;Forwards Number of Stairs: 20 General stair comments: Pt min G for safety with stair negoation. Ascended and descended 6 steps without rail to simulate steps to enter house, with use of HHA+1. Demonstrated HHA and appropriate guarding technique for pt's friend who will be assisting pt at d/c. Once pt felt comfortable with 3 steps with HHA, continued ascending stairs with rail on L as pt prefers sleeping in bed on second level at d/c. Pt ascended forwards with stronger LE leading and descended forwards with weaker LE leading.  Min cues for hand placement.   Wheelchair Mobility    Modified Rankin (Stroke Patients Only)       Balance Overall balance assessment: Mild deficits observed, not formally tested Sitting-balance support: No upper extremity supported;Feet supported Sitting balance-Leahy Scale: Good     Standing balance support: Single extremity supported;No upper extremity supported;During functional activity Standing balance-Leahy Scale: Fair Standing balance comment: Able to maintain static standing balance without UE support.                             Cognition Arousal/Alertness: Awake/alert Behavior During Therapy: WFL for tasks assessed/performed Overall Cognitive Status: Within Functional Limits for tasks assessed  General Comments: Unable to recall precautions without cueing      Exercises      General Comments General comments  (skin integrity, edema, etc.): Pt's friend present during session.      Pertinent Vitals/Pain Pain Assessment: 0-10 Pain Score: 9  Faces Pain Scale: Hurts even more Pain Location: back  Pain Descriptors / Indicators: Sore;Discomfort;Grimacing;Operative site guarding Pain Intervention(s): Limited activity within patient's tolerance;Monitored during session    [place] expects to be discharged to:: Private residence Living Arrangements: Parent;Children Available Help at Discharge: Family;Available 24 hours/day Type of Home: House Home Access: Stairs to enter Entrance Stairs-Rails: None Home Layout: Two level Home Equipment: None      Prior Function Level of Independence: Independent          PT Goals (current goals can now be found in the care plan section) Acute Rehab PT Goals Patient Stated Goal: to go home  PT Goal Formulation: With patient Time For Goal Achievement: 08/16/18 Potential to Achieve Goals: Good Progress towards PT goals: Progressing toward goals    Frequency    Min 5X/week      PT Plan Current plan remains appropriate    Co-evaluation              AM-PAC PT "6 Clicks" Daily Activity  Outcome Measure  Difficulty turning over in bed (including adjusting bedclothes, sheets and blankets)?: A Little Difficulty moving from lying on back to sitting on the side of the bed? : A Little Difficulty sitting down on and standing up from a chair with arms (e.g., wheelchair, bedside commode, etc,.)?: A Little Help needed moving to and from a bed to chair (including a wheelchair)?: A Little Help needed walking in hospital room?: None Help needed climbing 3-5 steps with a railing? : A Little 6 Click Score: 19    End of Session Equipment Utilized During Treatment: Gait belt;Back brace Activity Tolerance: Patient tolerated treatment well Patient left: in bed;with call bell/phone within reach;with family/visitor present Nurse  Communication: Mobility status PT Visit Diagnosis: Other abnormalities of gait and mobility (R26.89);Pain Pain - part of body: (back)     Time: 2395-3202 PT Time Calculation (min) (ACUTE ONLY): 22 min  Charges:  $Gait Training: 8-22 mins                     Einar Crow, [place]  Student Physical Therapist Acute Rehab [phone]    Einar Crow 08/03/2018, 1:10 PM

## 2018-08-03 NOTE — Evaluation (Signed)
Occupational Therapy Evaluation Patient Details Name: Penny [name] MRN: [id number] DOB: 10-Dec-1985 Today's Date: 08/03/2018    History of Present Illness Pt is a 32 y/o female s/p T12 Kyphoplasty secondary to compression fx. PMH includes seizures, migrain, lupus, opiod abuse, fibromyalgia, and s/p gastric bypass.    Clinical Impression   This 32 y/o female presents with the above. At baseline pt reports being independent with ADLs and functional mobility. Pt demonstrating functional mobility without AD and minguard-supervision throughout this session. Currently rquires setup assist for UB ADLs, minA for LB ADL secondary to adhering to back precautions. Pt reports she will return home with available assist from family for ADLs PRN. Educated pt on back precautions, safety and compensatory strategies for completing ADLs and functional transfers while maintaining precautions with pt verbalizing understanding. Questions answered throughout. No further acute OT needs identified at this time. Will sign off.     Follow Up Recommendations  No OT follow up;Supervision/Assistance - 24 hour    Equipment Recommendations  None recommended by OT           Precautions / Restrictions Precautions Precautions: Back Precaution Booklet Issued: Yes (comment) Precaution Comments: Reviewed back precautions with pt.  Restrictions Weight Bearing Restrictions: No      Mobility Bed Mobility Overal bed mobility: Needs Assistance Bed Mobility: Rolling;Sidelying to Sit;Sit to Sidelying Rolling: Supervision Sidelying to sit: Supervision     Sit to sidelying: Supervision General bed mobility comments: Supervision for safety. Increased time required secondary to pain. Educated about use of log roll technique.   Transfers Overall transfer level: Needs assistance Equipment used: None Transfers: Sit to/from Stand Sit to Stand: Supervision         General transfer comment: Supervision for safety.  Increased time required. Cues to power through LEs.     Balance Overall balance assessment: Needs assistance Sitting-balance support: No upper extremity supported;Feet supported Sitting balance-Leahy Scale: Good     Standing balance support: Single extremity supported;No upper extremity supported;During functional activity Standing balance-Leahy Scale: Fair Standing balance comment: Able to maintain static standing balance without UE support.                            ADL either performed or assessed with clinical judgement   ADL Overall ADL's : Needs assistance/impaired Eating/Feeding: Independent;Sitting   Grooming: Supervision/safety;Standing;Wash/dry hands   Upper Body Bathing: Min guard;Sitting;Standing   Lower Body Bathing: Minimal assistance;Sit to/from stand Lower Body Bathing Details (indicate cue type and reason): educated on use of LH sponge during task to increase independence and adherence to back precautions  Upper Body Dressing : Set up;Sitting Upper Body Dressing Details (indicate cue type and reason): pt able to manage spinal brace donning/donning with setup assist  Lower Body Dressing: Minimal assistance;Sit to/from stand Lower Body Dressing Details (indicate cue type and reason): assist to fully access LEs, declines demo of AE reporting family can assist with this task after return home Toilet Transfer: Min guard;Supervision/safety;Ambulation;Regular Museum/gallery exhibitions officer and Hygiene: Min guard;Sit to/from stand       Functional mobility during ADLs: Min guard;Supervision/safety General ADL Comments: educated pt on back precautions, brace management, safety, and compensatory strategies for completing ADLs while maintaining precautions with pt verbalizing understanding      Vision         Perception     Praxis      Pertinent Vitals/Pain Pain Assessment: Faces Faces Pain Scale: Hurts even  more Pain Location: back  Pain  Descriptors / Indicators: Aching;Operative site guarding Pain Intervention(s): Monitored during session;Repositioned     Hand Dominance     Extremity/Trunk Assessment Upper Extremity Assessment Upper Extremity Assessment: Overall WFL for tasks assessed   Lower Extremity Assessment Lower Extremity Assessment: Defer to PT evaluation   Cervical / Trunk Assessment Cervical / Trunk Assessment: Other exceptions Cervical / Trunk Exceptions: s/p kyphoplasty    Communication Communication Communication: No difficulties   Cognition Arousal/Alertness: Awake/alert Behavior During Therapy: WFL for tasks assessed/performed Overall Cognitive Status: Within Functional Limits for tasks assessed                                     General Comments       Exercises     Shoulder Instructions      Home Living Family/patient expects to be discharged to:: Private residence Living Arrangements: Parent;Children Available Help at Discharge: Family;Available 24 hours/day Type of Home: House Home Access: Stairs to enter [employer] of Steps: 2 Entrance Stairs-Rails: None Home Layout: Two level Alternate Level Stairs-Number of Steps: flight  Alternate Level Stairs-Rails: Right Bathroom Shower/Tub: Teacher, early years/pre: Standard Bathroom Accessibility: No   Home Equipment: None          Prior Functioning/Environment Level of Independence: Independent                 OT Problem List: Decreased range of motion;Decreased activity tolerance;Decreased knowledge of precautions      OT Treatment/Interventions:      OT Goals(Current goals can be found in the care plan section) Acute Rehab OT Goals Patient Stated Goal: to go home  OT Goal Formulation: All assessment and education complete, DC therapy  OT Frequency:     Barriers to D/C:            Co-evaluation              AM-PAC PT "6 Clicks" Daily Activity     Outcome Measure  Help from another person eating meals?: None Help from another person taking care of personal grooming?: None Help from another person toileting, which includes using toliet, bedpan, or urinal?: None Help from another person bathing (including washing, rinsing, drying)?: A Little Help from another person to put on and taking off regular upper body clothing?: None Help from another person to put on and taking off regular lower body clothing?: A Little 6 Click Score: 22   End of Session Equipment Utilized During Treatment: Back brace Nurse Communication: Mobility status  Activity Tolerance: Patient tolerated treatment well Patient left: in bed;with call bell/phone within reach  OT Visit Diagnosis: Other abnormalities of gait and mobility (R26.89)                Time: 0600-4599 OT Time Calculation (min): 16 min Charges:  OT General Charges $OT Visit: 1 Visit OT Evaluation $OT Eval Low Complexity: 1 Low  Lou Cal, OT Pager [phone] 08/03/2018   Penny [name] 08/03/2018, 11:55 AM

## 2018-08-04 ENCOUNTER — Other Ambulatory Visit (HOSPITAL_COMMUNITY): Payer: Self-pay | Admitting: Psychiatry

## 2018-08-04 ENCOUNTER — Other Ambulatory Visit: Payer: Self-pay | Admitting: Obstetrics and Gynecology

## 2018-08-04 DIAGNOSIS — M5412 Radiculopathy, cervical region: Secondary | ICD-10-CM | POA: Diagnosis not present

## 2018-08-04 DIAGNOSIS — M5441 Lumbago with sciatica, right side: Secondary | ICD-10-CM | POA: Diagnosis not present

## 2018-08-04 DIAGNOSIS — Z79899 Other long term (current) drug therapy: Secondary | ICD-10-CM | POA: Diagnosis not present

## 2018-08-04 DIAGNOSIS — G8929 Other chronic pain: Secondary | ICD-10-CM | POA: Diagnosis not present

## 2018-08-04 DIAGNOSIS — M5442 Lumbago with sciatica, left side: Secondary | ICD-10-CM | POA: Diagnosis not present

## 2018-08-08 ENCOUNTER — Other Ambulatory Visit (HOSPITAL_COMMUNITY): Payer: Self-pay | Admitting: Psychiatry

## 2018-08-09 NOTE — Discharge Summary (Signed)
Physician Discharge Summary  Patient ID: Penny [name] MRN: [id number] DOB/AGE: 03-08-1986 32 y.o.  Admit date: 08/02/2018 Discharge date: 08/03/18  Admission Diagnoses:  Thoracic 12 Fracture  Discharge Diagnoses:  Active Problems:   S/P kyphoplasty   Past Medical History:  Diagnosis Date  . ADD (attention deficit disorder)   . Anemia 2011   2o to GASTRIC BYPASS  . Anginal pain ([place])   . Anxiety   . Blood transfusion without reported diagnosis   . Chronic daily headache   . Depression   . Depression   . Dysmenorrhea 09/18/2013  . Dysrhythmia   . Elevated liver enzymes JUL 2011ALK PHOS 111-127 AST  143-267 ALT  213-321T BILI 0.6  ALB  3.7-4.06 Jun 2011 ALK PHOS 118 AST 24 ALT 42 T BILI 0.4 ALB 3.9  . Encounter for drug rehabilitation    at behavioral health for opioid addiction about 4 years ago  . Family history of adverse reaction to anesthesia    'dad had to be kept on pump for breathing for morphine'  . Fatty liver   . Fibroid 01/18/2017  . Fibromyalgia   . Gastric bypass status for obesity   . Gastritis JULY 2011  . Heart murmur   . 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 History of Holter monitoring   . Hx of opioid abuse    for about 4 years, about 4 years ago  . Interstitial cystitis   . Iron deficiency anemia 07/23/2010  . Irritable bowel syndrome 2012 DIARRHEA   JUN 2012 TTG IgA 14.9  . IUD FEB 2010  . Lupus ([place])   . Menorrhagia 07/18/2013  . Migraines   . Obesity (BMI 30-39.9) 2011 228 LBS BMI 36.8  . Ovarian cyst   . Patient desires pregnancy 09/18/2013  . Polyneuropathy   . PONV (postoperative nausea and vomiting) seizure post-operatively   seizure following ankle surgery  . Potassium (K) deficiency   . Pregnant 12/25/2013  . Psychiatric pseudoseizure   . RLQ abdominal pain 07/18/2013  . Sciatica of left side 11/14/2014  . 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 Stress 09/18/2013    Surgeries:  Procedure(s): KYPHOPLASTY T12 on 08/02/2018   Consultants (if any):   Discharged Condition: Improved  Hospital Course: Penny [name] is an 32 y.o. female who was admitted 08/02/2018 with a diagnosis of Thoracic 12 Fracture and went to the operating room on 08/02/2018 and underwent the above named procedures.  Post op day one pt reported a high level of pain.  Pt is on her chronic dose of Hysingla and oxycodone.  Pt is voiding w/o difficulty.  Pt ambulates with PT and is cleared for DC.   She was given perioperative antibiotics:  Anti-infectives (From admission, onward)   Start     Dose/Rate Route Frequency Ordered Stop   08/02/18 1615  valACYclovir (VALTREX) tablet 500 mg  Status:  Discontinued     500 mg Oral Daily 08/02/18 1604 08/03/18 2003   08/02/18 1615  ceFAZolin (ANCEF) IVPB 2g/100 mL premix     2 g 200 mL/hr over 30 Minutes Intravenous Every 8 hours 08/02/18 1604 08/03/18 0745   08/02/18 1045  ceFAZolin (ANCEF) IVPB 2g/100 mL premix     2 g 200 mL/hr over 30 Minutes Intravenous 30 min pre-op 08/02/18 0918 08/02/18 1240   08/02/18 0922  ceFAZolin (ANCEF) 2-4 GM/100ML-% IVPB    Note to Pharmacy:  Debbe Bales, Meredit: cabinet override  08/02/18 2751 08/02/18 1225    .  She was given sequential compression devices, early ambulation, and TED for DVT prophylaxis.  She benefited maximally from the hospital stay and there were no complications.    Recent vital signs:  Vitals:   08/03/18 0344 08/03/18 0728  BP: (!) 93/54 98/63  Pulse: (!) 58 (!) 52  Resp: 18 18  Temp: 98.3 F (36.8 C) 98 F (36.7 C)  SpO2: 97% 99%    Recent laboratory studies:  Lab Results  Component Value Date   HGB 12.1 07/25/2018   HGB 13.3 07/10/2018   HGB 12.5 05/19/2018   Lab Results  Component Value Date   WBC 3.8 (L) 07/25/2018   PLT 210 07/25/2018   Lab Results  Component Value Date   INR 0.98 12/07/2016   Lab Results  Component Value Date   NA 140 07/25/2018   K 4.1 07/25/2018    CL 107 07/25/2018   CO2 28 07/25/2018   BUN 9 07/25/2018   CREATININE 0.75 07/25/2018   GLUCOSE 100 (H) 07/25/2018    Discharge Medications:   Allergies as of 08/03/2018      Reactions   Gabapentin Other (See Comments)   Pt states that she was unresponsive after taking this medication, but her vitals remained stable.     Metoclopramide Hcl Anxiety, Other (See Comments)   Pt states that she felt like she was trapped in a box, and could not get out.  Pt also states that she had temporary loss of movement, weakness, and tingling.     Tramadol Other (See Comments)   Seizures   Ativan [lorazepam] Other (See Comments)   combative   Latex Itching, Rash, Other (See Comments)   Burns   Lyrica [pregabalin] Other (See Comments)   suicidal   Trazodone And Nefazodone Other (See Comments)   Makes pt "like a zombie"   Zofran [ondansetron] Other (See Comments)   Migraines in high doses    Butrans [buprenorphine] Rash   Patch caused rash, didn't help with pain   Nucynta [tapentadol] Nausea And Vomiting   Propofol Nausea And Vomiting   Tape Itching, Rash, Other (See Comments)   Please use paper tape   Toradol [ketorolac Tromethamine] Anxiety      Medication List    STOP taking these medications   ibuprofen 200 MG tablet Commonly known as:  ADVIL,MOTRIN     TAKE these medications   acetaminophen 500 MG tablet Commonly known as:  TYLENOL Take 1,000 mg by mouth every 6 (six) hours as needed for moderate pain or headache.   carbamazepine 200 MG tablet Commonly known as:  TEGRETOL Take two tablets at bedtime What changed:    how much to take  how to take this  when to take this  additional instructions   COLACE PO Take 1 capsule by mouth daily.   cyclobenzaprine 10 MG tablet Commonly known as:  FLEXERIL Take 1 tablet (10 mg total) by mouth 2 (two) times daily as needed for muscle spasms.   DULoxetine 60 MG capsule Commonly known as:  CYMBALTA Take 2 capsules (120 mg  total) by mouth at bedtime.   HYSINGLA ER 30 MG T24a Generic drug:  HYDROcodone Bitartrate ER Take 30 mg by mouth at bedtime.   MULTIVITAMIN/IRON PO Take 2 tablets by mouth daily.   Oxycodone HCl 10 MG Tabs Take 10 mg by mouth every 6 (six) hours as needed (pain).   pantoprazole 40 MG tablet Commonly known as:  PROTONIX Take 1 tablet (40 mg total) by mouth daily.   phenazopyridine 200 MG tablet Commonly known as:  PYRIDIUM Take 200 mg by mouth 3 (three) times daily as needed for pain.   PROAIR HFA 108 (90 Base) MCG/ACT inhaler Generic drug:  albuterol INHALE 2 PUFFS INTO THE LUNGS EVERY 6 HOURS AS NEEDED. What changed:  See the new instructions.   promethazine 25 MG tablet Commonly known as:  PHENERGAN TAKE ONE TABLET BY MOUTH EVERY 6 HOURS AS NEEDED. What changed:  reasons to take this   topiramate 50 MG tablet Commonly known as:  TOPAMAX Take one in the am and 2 at night What changed:    how much to take  how to take this  when to take this  additional instructions   valACYclovir 500 MG tablet Commonly known as:  VALTREX Take 500 mg by mouth daily.   VRAYLAR capsule Generic drug:  cariprazine Take 1.5 mg by mouth daily. What changed:  Another medication with the same name was removed. Continue taking this medication, and follow the directions you see here.       Diagnostic Studies: Dg Thoracolumabar Spine  Result Date: 08/02/2018 CLINICAL DATA:  T12 vertebral augmentation performed for a subacute T12 compression fracture. EXAM: OPERATIVE THORACOLUMBAR SPINE 2 VIEW(S) COMPARISON:  MRI thoracic spine 05/19/2018. Lumbar spine x-rays 06/02/2018. FINDINGS: Three spot images from the C-arm fluoroscopic device are submitted for interpretation postoperatively, AP and LATERAL views of the thoracolumbar spine. T12 vertebral augmentation. No evidence of extravasation of the injected methylmethacrylate. Anatomic POSTERIOR alignment. IMPRESSION: No acute complicating  features post T12 vertebral augmentation. Electronically Signed   By: Evangeline Dakin M.D.   On: 08/02/2018 15:18   Dg C-arm 1-60 Min  Result Date: 08/02/2018 CLINICAL DATA:  T12 vertebral augmentation performed for a subacute T12 compression fracture. EXAM: OPERATIVE THORACOLUMBAR SPINE 2 VIEW(S) COMPARISON:  MRI thoracic spine 05/19/2018. Lumbar spine x-rays 06/02/2018. FINDINGS: Three spot images from the C-arm fluoroscopic device are submitted for interpretation postoperatively, AP and LATERAL views of the thoracolumbar spine. T12 vertebral augmentation. No evidence of extravasation of the injected methylmethacrylate. Anatomic POSTERIOR alignment. IMPRESSION: No acute complicating features post T12 vertebral augmentation. Electronically Signed   By: Evangeline Dakin M.D.   On: 08/02/2018 15:18    Disposition:  Pt will present to clinic in 2 weeks No post op med provided pt is in Pain management Discharge Instructions    Incentive spirometry RT   Complete by:  As directed       Follow-up Information    Melina Schools, MD Follow up in 2 week(s).   Specialty:  Orthopedic Surgery Contact information: [address] [place] [address] [phone]            Signed: Valinda Hoar 08/09/2018, 10:21 AM

## 2018-08-14 ENCOUNTER — Other Ambulatory Visit (HOSPITAL_COMMUNITY): Payer: Self-pay

## 2018-08-16 ENCOUNTER — Ambulatory Visit (HOSPITAL_COMMUNITY): Payer: Self-pay

## 2018-08-20 IMAGING — DX DG CHEST 2V
2 series · 2 of 2 positions shown · non-contrast
Comparison: Chest radiograph dated 12/28/2015

CLINICAL DATA: 29-year-old female with chest pain

EXAM:
CHEST  2 VIEW

[chest pa]
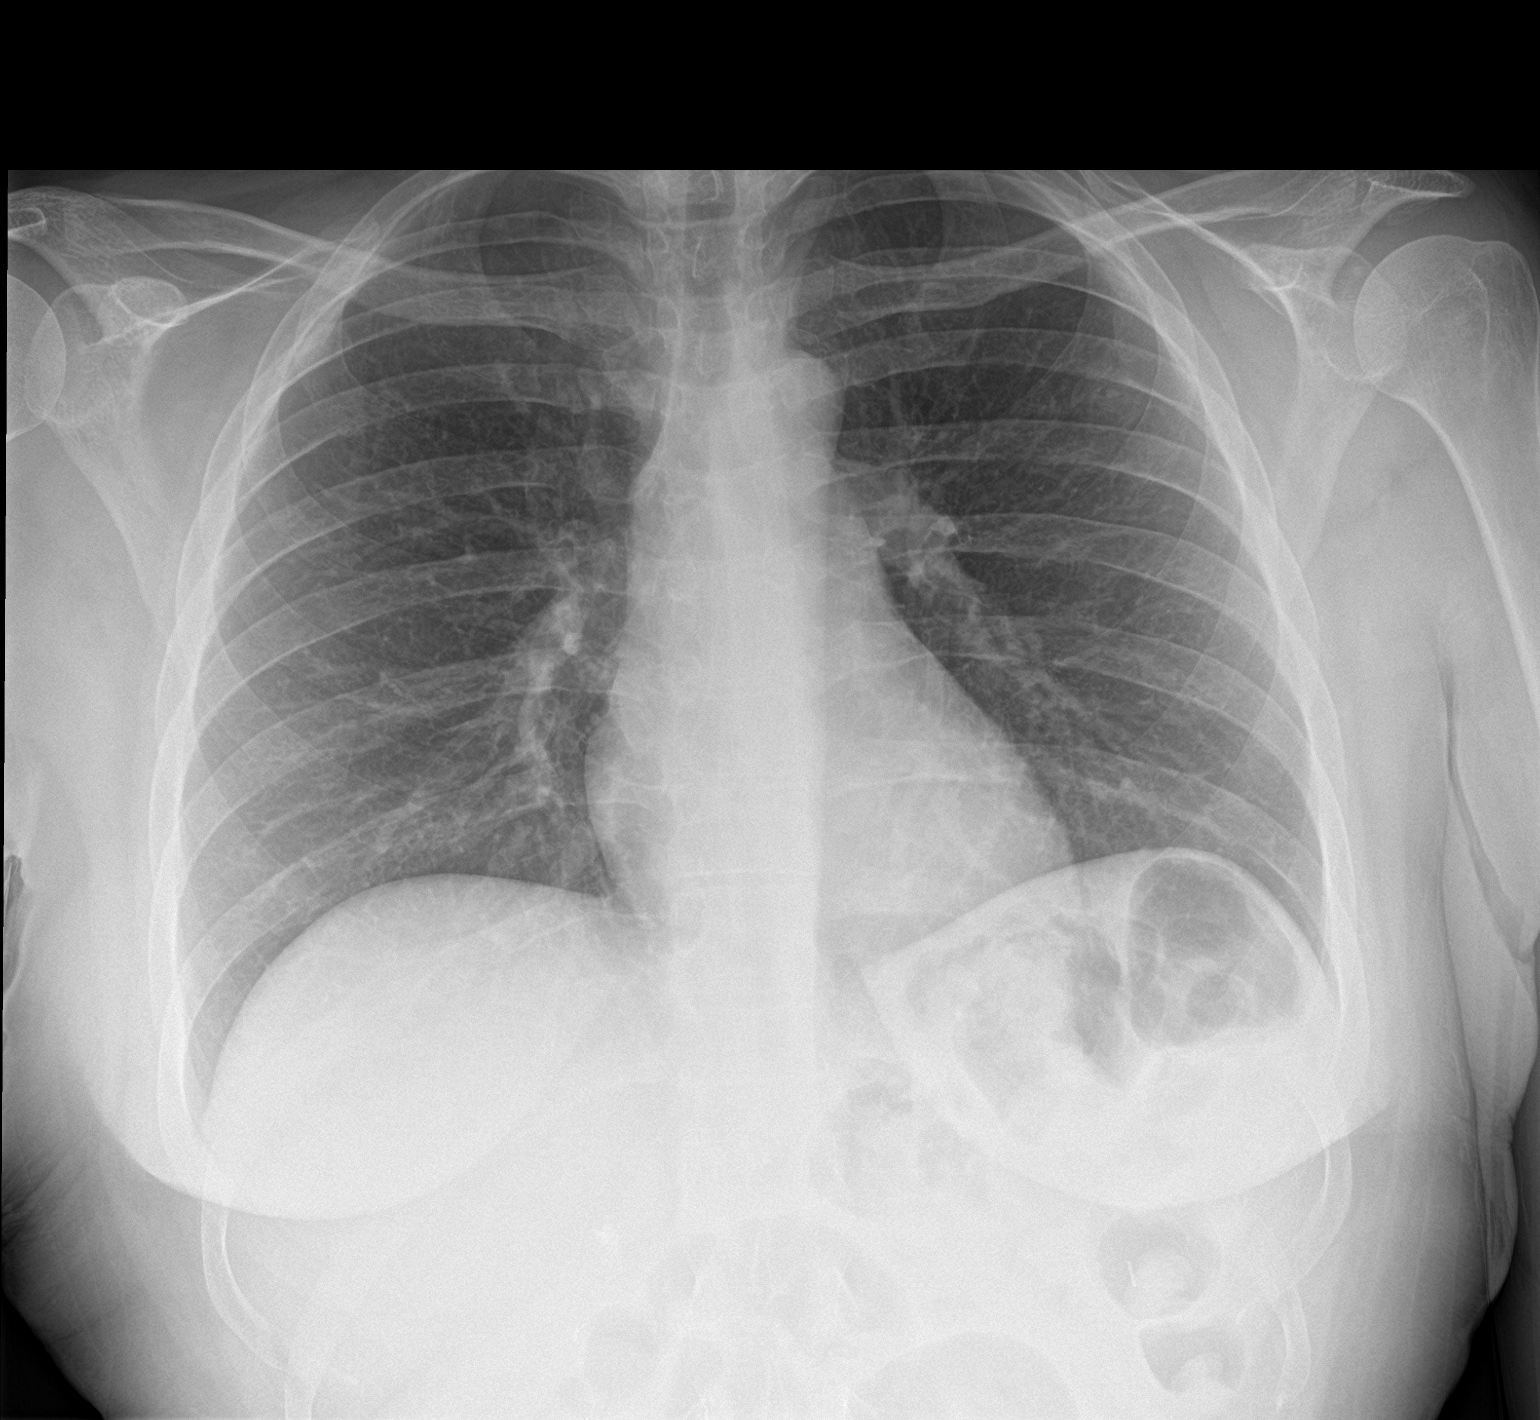

[chest lat]
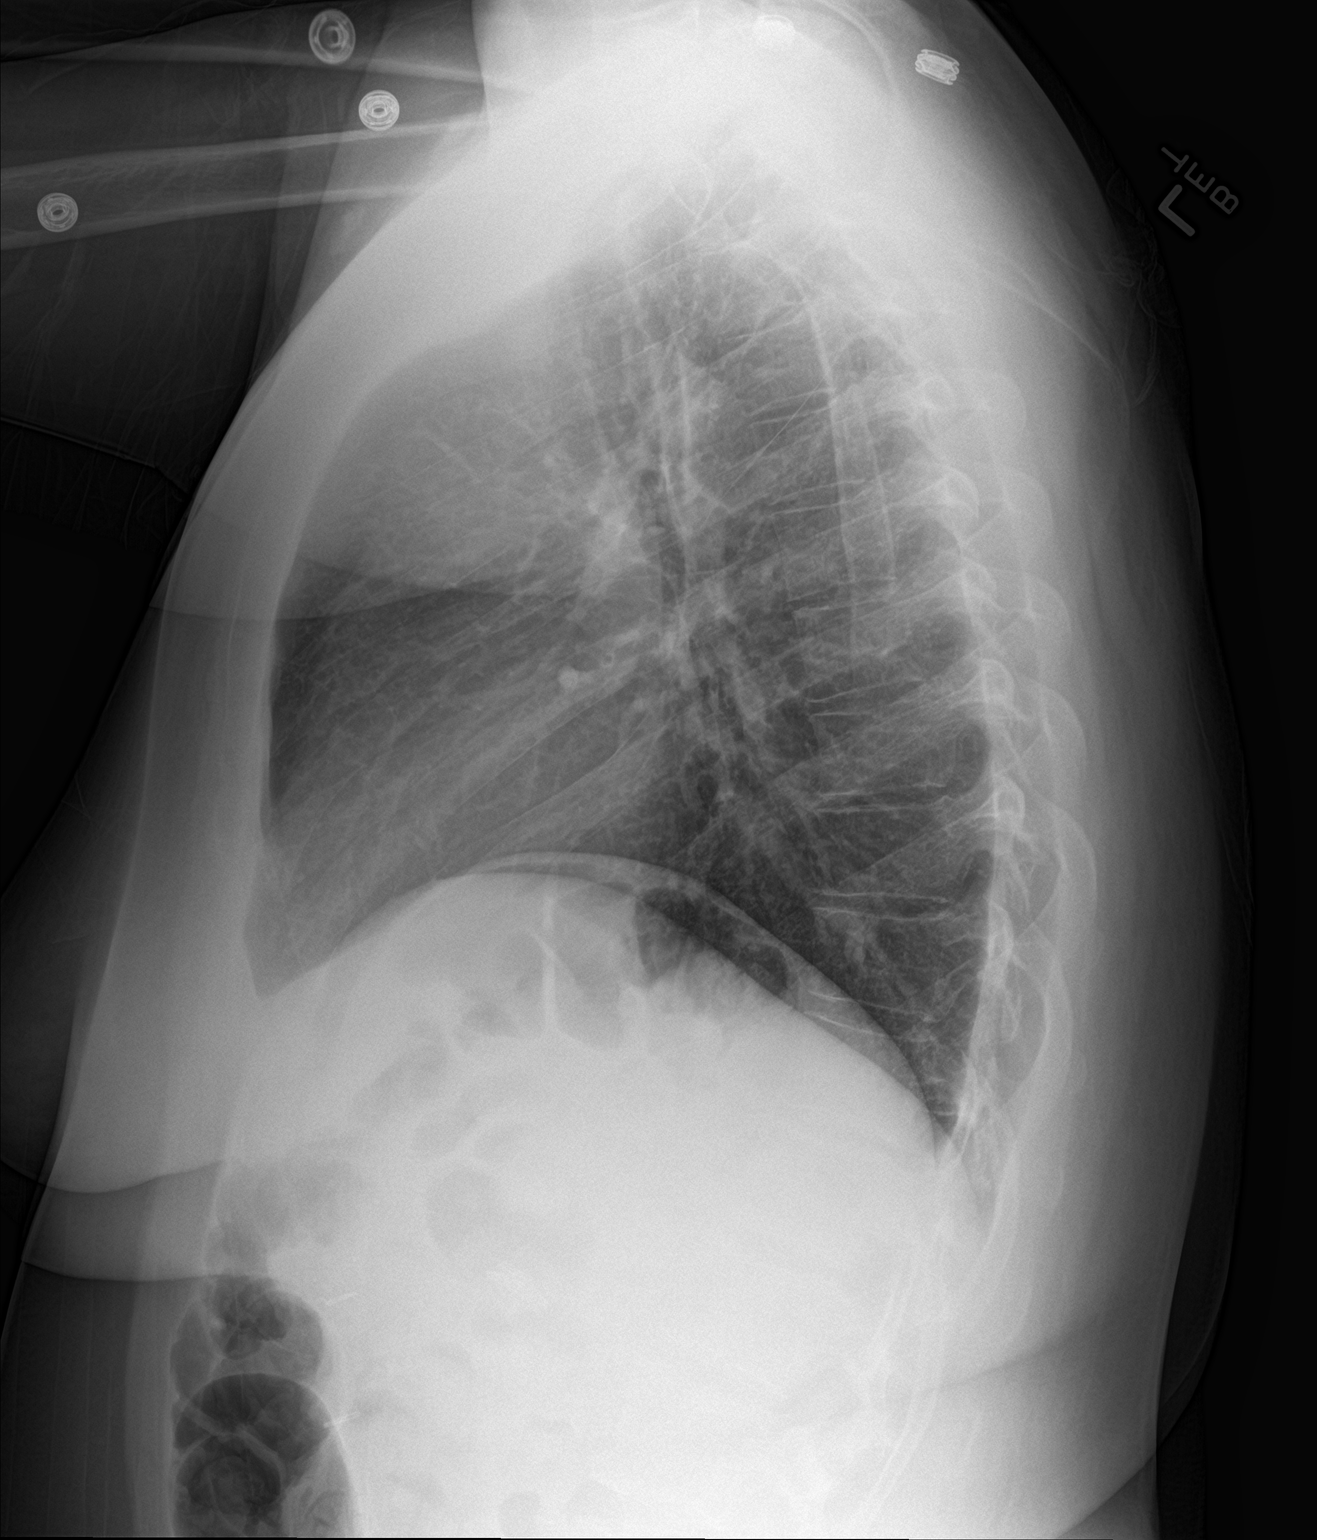

[2 of 2 positions shown; findings below may reference images not displayed]

FINDINGS: The heart size and mediastinal contours are within normal limits.
Both lungs are clear. The visualized skeletal structures are
unremarkable.
IMPRESSION: No active cardiopulmonary disease.

## 2018-08-21 ENCOUNTER — Ambulatory Visit: Payer: Self-pay | Admitting: Cardiovascular Disease

## 2018-08-25 ENCOUNTER — Telehealth: Payer: Self-pay | Admitting: *Deleted

## 2018-08-25 MED ORDER — KETOROLAC TROMETHAMINE 10 MG PO TABS: 10.0000 mg | ORAL_TABLET | Freq: Four times a day (QID) | ORAL | 0 refills | Status: DC | PRN

## 2018-08-25 MED ORDER — VALACYCLOVIR HCL 1 G PO TABS: ORAL_TABLET | ORAL | 99 refills | Status: DC

## 2018-08-25 NOTE — Telephone Encounter (Signed)
Patient states she is having a terrible outbreak in her vaginal area.  She has been taking her Valtrex but is requesting something for the pain until she can be seen.  States it does not have to be a narcotic.  Please advise.

## 2018-08-25 NOTE — Telephone Encounter (Signed)
Pt called requests something for pain with severe herpes outbreak, has been taking vlatrex 500 mg 1 daily Will increase to 1000 mg 1 bid for 5 days then 1 daily and will rx toradol.

## 2018-08-28 ENCOUNTER — Telehealth (HOSPITAL_COMMUNITY): Payer: Self-pay | Admitting: *Deleted

## 2018-08-28 ENCOUNTER — Other Ambulatory Visit (HOSPITAL_COMMUNITY): Payer: Self-pay | Admitting: Psychiatry

## 2018-08-28 MED ORDER — AMPHETAMINE-DEXTROAMPHET ER 30 MG PO CP24: ORAL_CAPSULE | ORAL | 0 refills | Status: DC

## 2018-08-28 NOTE — Telephone Encounter (Signed)
Dr Harrington Challenger Patient called requesting refill on Adderall to be sent to [facility] (uppicked up dated in system). Rx sent to [facility] on 9/3 I called & it was never picked up.   So  I asked if they would just void that Rx Please send new Rx to [facility] for Adderall

## 2018-08-28 NOTE — Telephone Encounter (Signed)
Opened chart to verify verification of medication

## 2018-08-28 NOTE — Telephone Encounter (Signed)
sent 

## 2018-08-29 ENCOUNTER — Telehealth (HOSPITAL_COMMUNITY): Payer: Self-pay | Admitting: *Deleted

## 2018-08-29 NOTE — Telephone Encounter (Signed)
Dr Harrington Challenger Patient called requesting a letter from you stating that you are no longer prescribing Klonopin to her. She has a appointment tomorrow 08/30/18 @ 8:00 am with Dr [name] with a addiction treatment & counseling program. Named: [facility]

## 2018-09-05 IMAGING — DX DG FOOT COMPLETE 3+V*L*
3 series · 3 of 3 positions shown · non-contrast
Comparison: 10/05/2015

CLINICAL DATA: Head board fell onto foot with pain, initial
encounter

EXAM:
LEFT FOOT - COMPLETE 3+ VIEW

[foot ap]
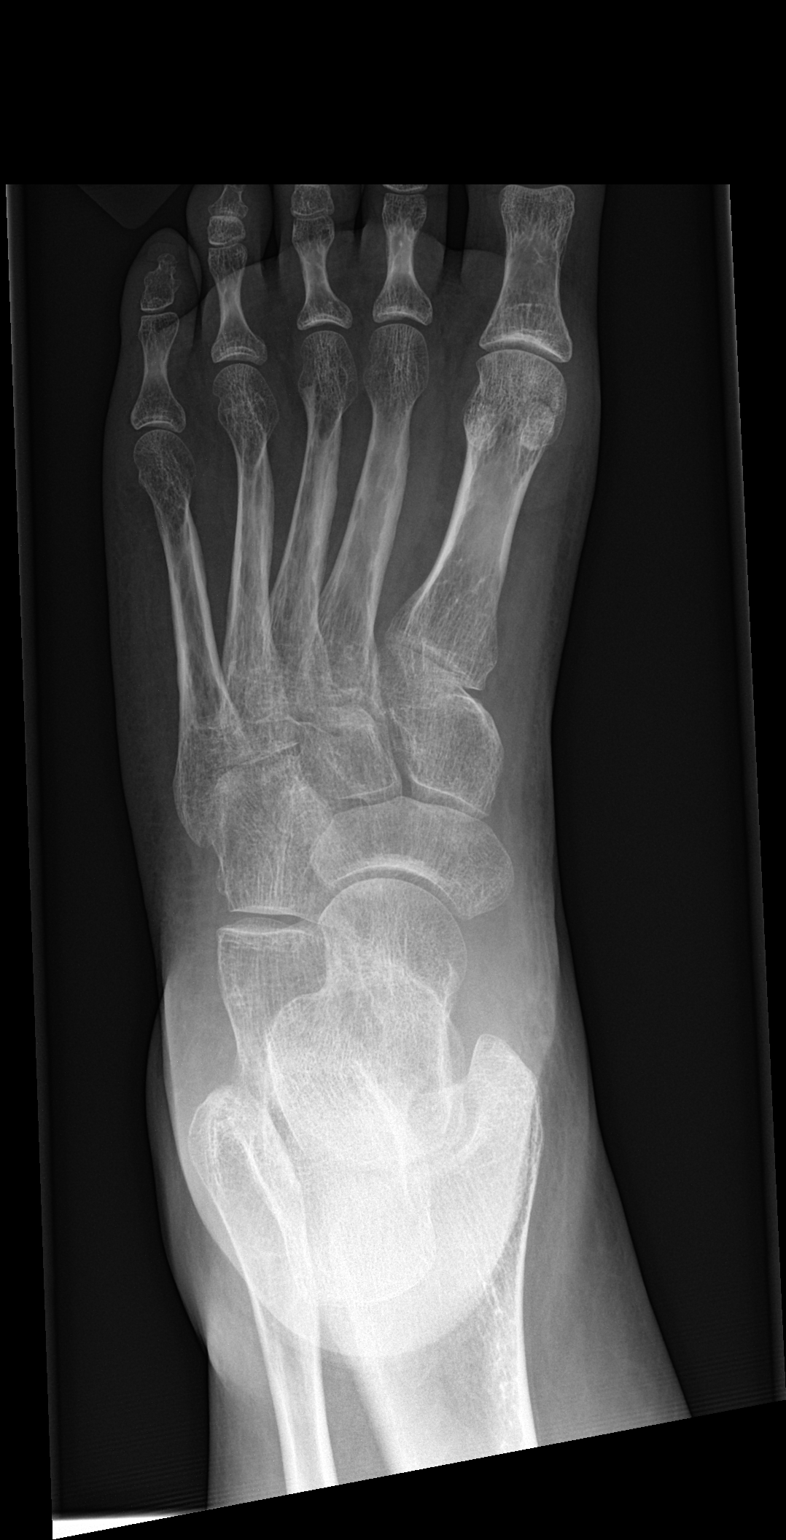

[foot obl]
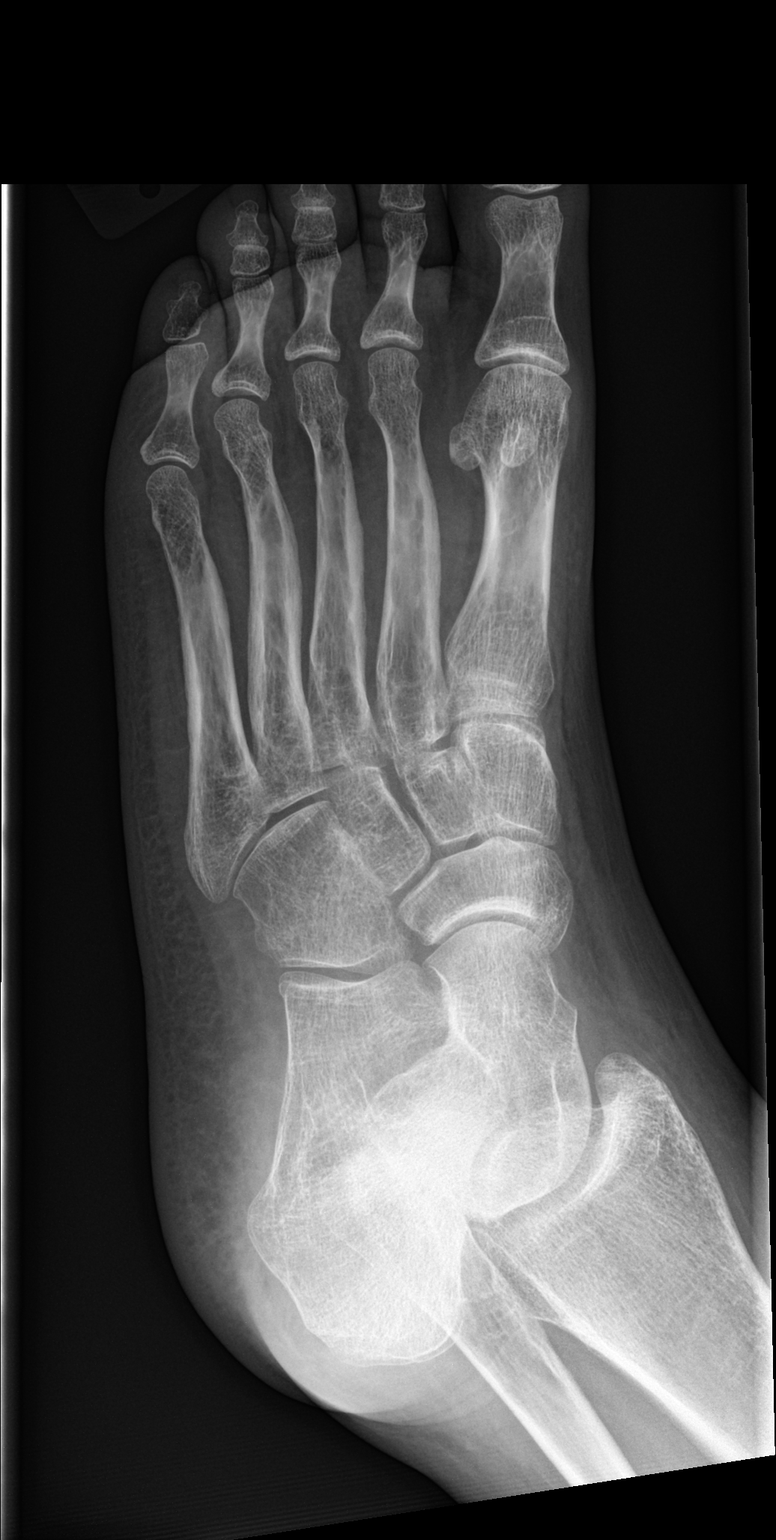

[foot lat]
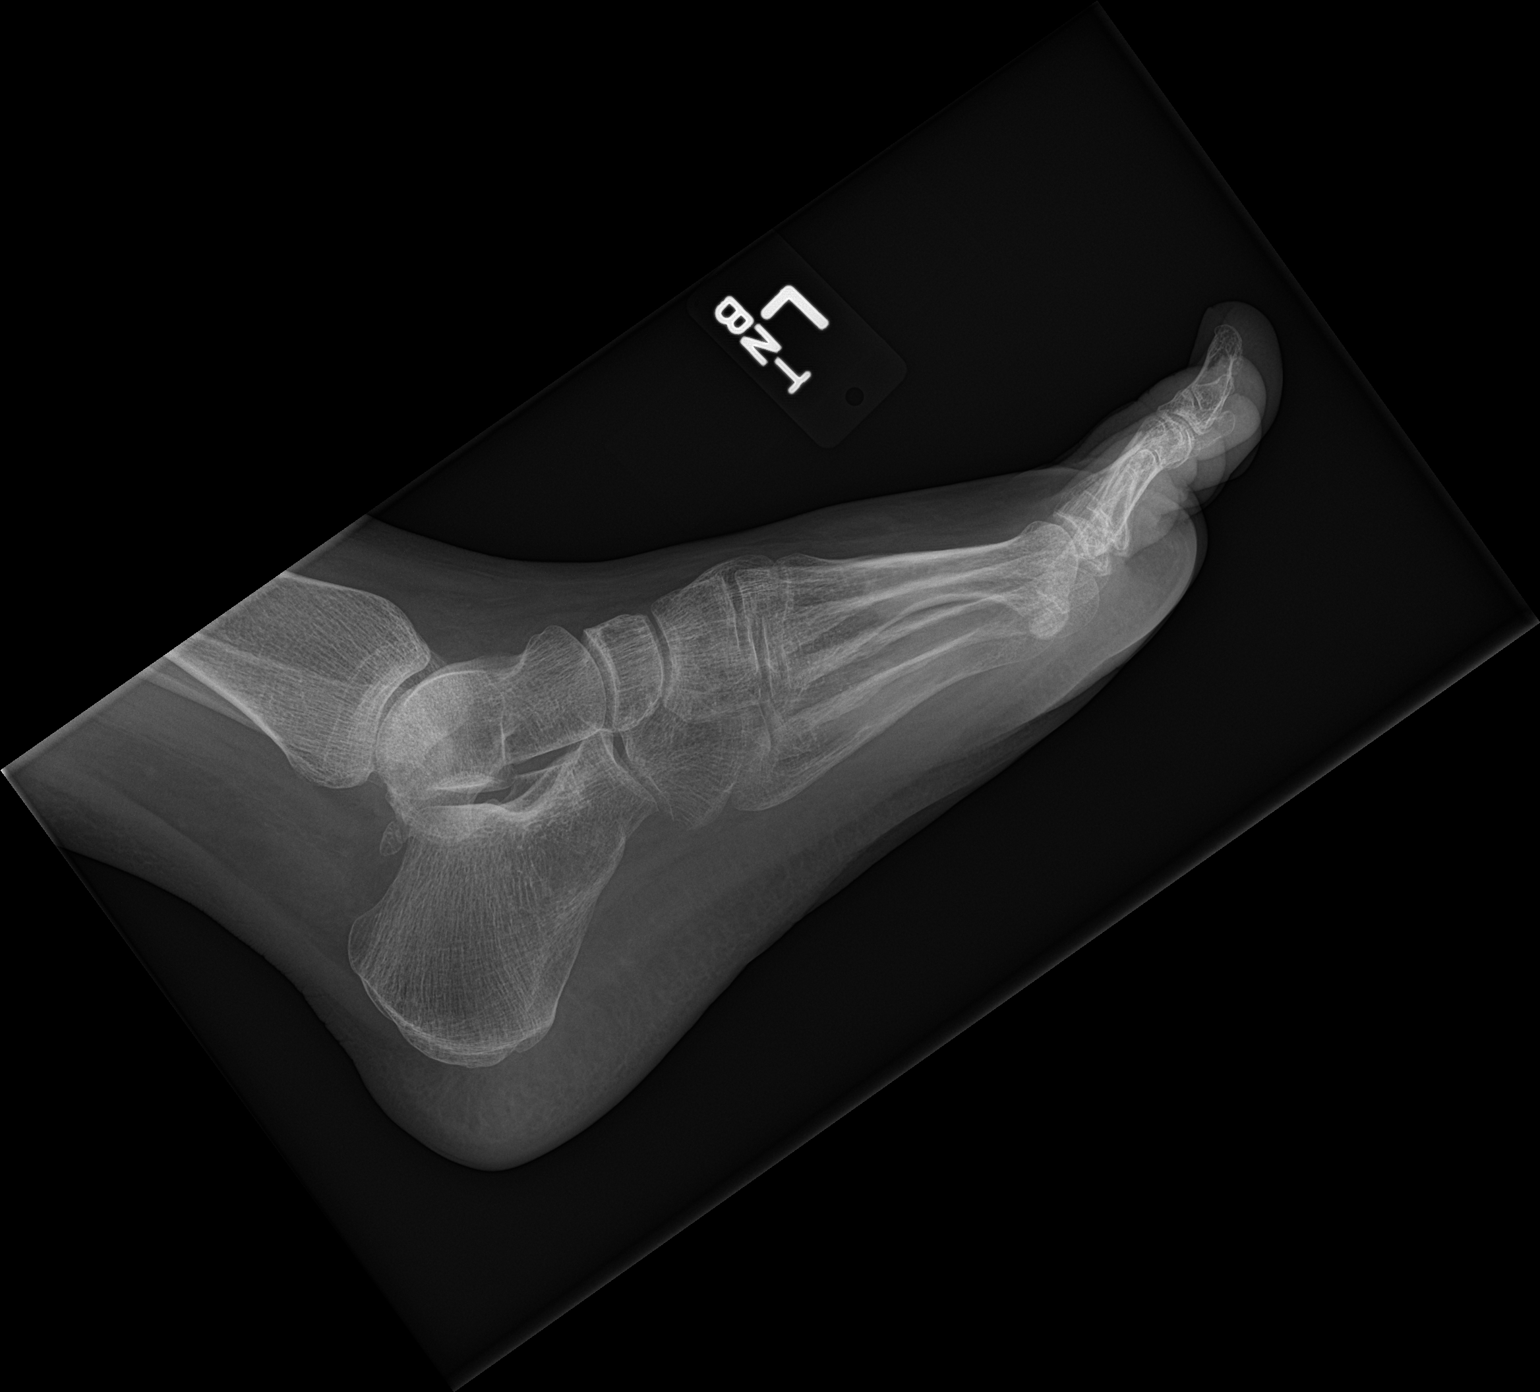

[3 of 3 positions shown; findings below may reference images not displayed]

FINDINGS: No acute fracture or dislocation is seen. No gross soft tissue
abnormality is noted. Mild osteopenia is noted which has progressed
significantly in the interval from the prior exam. Clinical
correlation is recommended.
IMPRESSION: No acute bony abnormality is seen.

Some generalized osteopenia is noted new from the prior exam.
Clinical correlation is recommended.

## 2018-09-07 DIAGNOSIS — Z029 Encounter for administrative examinations, unspecified: Secondary | ICD-10-CM

## 2018-09-11 ENCOUNTER — Telehealth: Payer: Self-pay | Admitting: Adult Health

## 2018-09-11 NOTE — Telephone Encounter (Signed)
Called patient back and she stated that she had a fall over the summer at work. Since that time she has trouble holding her bladder. She would like to be seen by Penny [name] for this. It would fall under her worker compensation so she needs ASAP appt. Advised pt to call front desk to make appt and let them know the urgency. Also will route note to [facility] for her info.

## 2018-09-11 NOTE — Telephone Encounter (Signed)
Patient called stating that she would like a call back from Jennifer's nurse, Patient did not state the reason why. Please contact pt

## 2018-09-18 ENCOUNTER — Ambulatory Visit: Payer: Self-pay | Admitting: Adult Health

## 2018-09-19 ENCOUNTER — Ambulatory Visit: Payer: Self-pay | Admitting: Adult Health

## 2018-09-28 ENCOUNTER — Ambulatory Visit: Payer: Self-pay | Admitting: Obstetrics and Gynecology

## 2018-09-28 ENCOUNTER — Encounter: Payer: Self-pay | Admitting: *Deleted

## 2018-10-26 ENCOUNTER — Ambulatory Visit: Payer: BLUE CROSS/BLUE SHIELD | Admitting: Obstetrics and Gynecology

## 2018-10-26 ENCOUNTER — Encounter: Payer: Self-pay | Admitting: Obstetrics and Gynecology

## 2018-10-26 ENCOUNTER — Encounter (INDEPENDENT_AMBULATORY_CARE_PROVIDER_SITE_OTHER): Payer: Self-pay

## 2018-10-26 VITALS — BP 107/74 | HR 86 | Wt 163.2 lb

## 2018-10-26 DIAGNOSIS — N3941 Urge incontinence: Secondary | ICD-10-CM | POA: Insufficient documentation

## 2018-10-26 MED ORDER — SOLIFENACIN SUCCINATE 5 MG PO TABS: 5.0000 mg | ORAL_TABLET | Freq: Every day | ORAL | 5 refills | Status: DC

## 2018-10-26 NOTE — Progress Notes (Addendum)
Patient ID: Penny [name], female   DOB: 02/13/1986, 32 y.o.   MRN: [id number]    [facility] Visit  @DATE@            Patient name: Penny [name] MRN [id number]  Date of birth: 01/22/1986  CC & HPI:  Penny [name] is a 32 y.o. female presenting today for urinary incontinence. She had back surgery over the summer because she broke her T12 vertebra. Her surgery was done by Dr. Brooks at [facility] in [place]. Since the surgery, she finds she has had muscle spasms in the back,  and incontinence. Sometimes she feels that she needs to use the bathroom, but other times she doesn't so she wears a pad all the time. She loses feeling in both her legs when she walks and stands for a long period of time. Pt also notes pain in her hips but she can walk independently. She has had multiple medical issues during pregnancies, and rarely has it been easy to find solutions that work long term for her.She has not tried any anti-spasm medication. The patient denies fever, chills or any other symptoms or complaints at this time.   ROS:  ROS + urinary incontinence + muscle spasms + hip pain - fever - chills All systems are negative except as noted in the HPI and PMH.   Pertinent History Reviewed:   Reviewed:  Medical         Past Medical History:  Diagnosis Date  . ADD (attention deficit disorder)   . Anemia 2011   2o to GASTRIC BYPASS  . Anginal pain ([facility])   . Anxiety   . Blood transfusion without reported diagnosis   . Chronic daily headache   . Depression   . Depression   . Dysmenorrhea 09/18/2013  . Dysrhythmia   . Elevated liver enzymes JUL 2011ALK PHOS 111-127 AST  143-267 ALT  213-321T BILI 0.6  ALB  3.7-4.06 Jun 2011 ALK PHOS 118 AST 24 ALT 42 T BILI 0.4 ALB 3.9  . Encounter for drug rehabilitation    at behavioral health for opioid addiction about 4 years ago  . Family history of adverse reaction to anesthesia    'dad had to be kept on pump for breathing for morphine'   . Fatty liver   . Fibroid 01/18/2017  . Fibromyalgia   . Gastric bypass status for obesity   . Gastritis JULY 2011  . Heart murmur   . 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 History of Holter monitoring   . Hx of opioid abuse ([facility])    for about 4 years, about 4 years ago  . Interstitial cystitis   . Iron deficiency anemia 07/23/2010  . Irritable bowel syndrome 2012 DIARRHEA   JUN 2012 TTG IgA 14.9  . IUD FEB 2010  . Lupus ([facility])   . Menorrhagia 07/18/2013  . Migraines   . Obesity (BMI 30-39.9) 2011 228 LBS BMI 36.8  . Ovarian cyst   . Patient desires pregnancy 09/18/2013  . Polyneuropathy   . PONV (postoperative nausea and vomiting) seizure post-operatively   seizure following ankle surgery  . Potassium (K) deficiency   . Pregnant 12/25/2013  . Psychiatric pseudoseizure   . RLQ abdominal pain 07/18/2013  . Sciatica of left side 11/14/2014  . 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 Stress 09/18/2013                                Surgical Hx:    Past Surgical History:  Procedure Laterality Date  . BIOPSY  04/10/2015   Procedure: BIOPSY;  Surgeon: Daneil Dolin, MD;  Location: [facility];  Service: Endoscopy;;  . cath self every nite     for sodium bicarb injection (discontinued 2013)  . CHOLECYSTECTOMY  2005   biliary dyskinesia  . COLONOSCOPY  JUN 2012 ABD PN/DIARRHEA WITH PROPOFOL   NL COLON  . DILATION AND CURETTAGE OF UTERUS    . DILITATION & CURRETTAGE/HYSTROSCOPY WITH NOVASURE ABLATION N/A 03/24/2017   Procedure: DILATATION & CURETTAGE/HYSTEROSCOPY WITH NOVASURE ENDOMETRIAL ABLATION;  Surgeon: Jonnie Kind, MD;  Location: [facility];  Service: Gynecology;  Laterality: N/A;  . ESOPHAGEAL DILATION N/A 04/10/2015   Procedure: ESOPHAGEAL DILATION WITH 54FR MALONEY DILATOR;  Surgeon: Daneil Dolin, MD;  Location: [facility];  Service: Endoscopy;  Laterality: N/A;  . ESOPHAGOGASTRODUODENOSCOPY    . ESOPHAGOGASTRODUODENOSCOPY (EGD) WITH PROPOFOL N/A 04/10/2015    Procedure: ESOPHAGOGASTRODUODENOSCOPY (EGD) WITH PROPOFOL;  Surgeon: Daneil Dolin, MD;  Location: [facility];  Service: Endoscopy;  Laterality: N/A;  . ESOPHAGOGASTRODUODENOSCOPY (EGD) WITH PROPOFOL N/A 12/06/2016   Procedure: ESOPHAGOGASTRODUODENOSCOPY (EGD) WITH PROPOFOL;  Surgeon: Danie Binder, MD;  Location: [facility];  Service: Endoscopy;  Laterality: N/A;  . GAB  2007   in [place]-POUCH 5 CM  . GASTRIC BYPASS  06/2006  . HYSTEROSCOPY W/D&C N/A 09/12/2014   Procedure: DILATATION AND CURETTAGE /HYSTEROSCOPY;  Surgeon: Jonnie Kind, MD;  Location: [facility];  Service: Gynecology;  Laterality: N/A;  . KYPHOPLASTY N/A 08/02/2018   Procedure: KYPHOPLASTY T12;  Surgeon: Melina Schools, MD;  Location: [facility];  Service: Orthopedics;  Laterality: N/A;  60 mins  . LAPAROSCOPIC TUBAL LIGATION Bilateral 03/24/2017   Procedure: LAPAROSCOPIC TUBAL LIGATION ([place] Rings);  Surgeon: Jonnie Kind, MD;  Location: [facility];  Service: Gynecology;  Laterality: Bilateral;  . REPAIR VAGINAL CUFF N/A 07/30/2014   Procedure: REPAIR VAGINAL CUFF;  Surgeon: Mora Bellman, MD;  Location: [facility];  Service: Gynecology;  Laterality: N/A;  . SAVORY DILATION  06/20/2012   Dr. Barnie Alderman gastritis/Ulcer in the mid jejunum. Empiric dilation.   . SURAL NERVE BX Left 02/25/2016   Procedure: LEFT SURAL NERVE BIOPSY;  Surgeon: Jovita Gamma, MD;  Location: [facility];  Service: Neurosurgery;  Laterality: Left;  Left sural nerve biopsy  . TONSILLECTOMY    . TONSILLECTOMY AND ADENOIDECTOMY    . UPPER GASTROINTESTINAL ENDOSCOPY  JULY 2011 NAUSEA-D125,V6, PH 25   Bx; GASTRITIS, POUCH-5 CM LONG  . WISDOM TOOTH EXTRACTION     Medications: Reviewed & Updated - see associated section                       Current Outpatient Medications:  .  acetaminophen (TYLENOL) 500 MG tablet, Take 1,000 mg by mouth every 6 (six) hours as needed for moderate pain or headache., Disp: , Rfl:  .  amphetamine-dextroamphetamine (ADDERALL XR)  30 MG 24 hr capsule, TAKE (1) CAPSULE BY MOUTH EVERY DAY., Disp: 30 capsule, Rfl: 0 .  carbamazepine (TEGRETOL) 200 MG tablet, Take two tablets at bedtime (Patient taking differently: Take 400 mg by mouth at bedtime. ), Disp: 60 tablet, Rfl: 2 .  cariprazine (VRAYLAR) capsule, Take 1.5 mg by mouth daily., Disp: , Rfl:  .  cloNIDine (CATAPRES) 0.1 MG tablet, TAKE 1 TABLET BY MOUTH AT BEDTIME., Disp: 30 tablet, Rfl: 0 .  Docusate Sodium (COLACE PO), Take 1 capsule by  mouth daily., Disp: , Rfl:  .  DULoxetine (CYMBALTA) 60 MG capsule, Take 2 capsules (120 mg total) by mouth at bedtime., Disp: 60 capsule, Rfl: 2 .  HYDROcodone Bitartrate ER (HYSINGLA ER) 30 MG T24A, Take 30 mg by mouth at bedtime. , Disp: , Rfl:  .  Multiple Vitamins-Iron (MULTIVITAMIN/IRON PO), Take 2 tablets by mouth daily., Disp: , Rfl:  .  pantoprazole (PROTONIX) 40 MG tablet, Take 1 tablet (40 mg total) by mouth daily., Disp: 90 tablet, Rfl: 3 .  phenazopyridine (PYRIDIUM) 200 MG tablet, Take 200 mg by mouth 3 (three) times daily as needed for pain., Disp: , Rfl:  .  PROAIR HFA 108 (90 Base) MCG/ACT inhaler, INHALE 2 PUFFS INTO THE LUNGS EVERY 6 HOURS AS NEEDED. (Patient taking differently: Inhale 2 puffs into the lungs every 6 (six) hours as needed for wheezing or shortness of breath. ), Disp: 8.5 g, Rfl: 0 .  promethazine (PHENERGAN) 25 MG tablet, TAKE ONE TABLET BY MOUTH EVERY 6 HOURS AS NEEDED. (Patient taking differently: Take 25 mg by mouth every 6 (six) hours as needed for nausea or vomiting. ), Disp: 30 tablet, Rfl: 0 .  topiramate (TOPAMAX) 50 MG tablet, Take one in the am and 2 at night (Patient taking differently: Take 50-100 mg by mouth See admin instructions. Take 50 mg in the morning and 100 mg at night), Disp: 90 tablet, Rfl: 2 .  valACYclovir (VALTREX) 1000 MG tablet, Take 1 bid for 5 days then 1 daily for suppression, Disp: 40 tablet, Rfl: prn .  clonazePAM (KLONOPIN) 0.5 MG tablet, TAKE (1) TABLET BY MOUTH 3 TIMES  DAILY AS NEEDED FOR ANXIETY. (Patient not taking: Reported on 10/26/2018), Disp: 90 tablet, Rfl: 0   Social History: Reviewed -  reports that she has quit smoking. Her smoking use included e-cigarettes. She has a 1.50 pack-year smoking history. She has never used smokeless tobacco.  Objective Findings:  Vitals: Blood pressure 107/74, pulse 86, weight 163 lb 3.2 oz (74 kg).  PHYSICAL EXAMINATION General appearance - alert, appears upset, oriented to person, place, and time and normal appearing weight Mental status - alert, oriented to person, place, and time, normal mood, behavior, speech, dress, motor activity, and thought processes, affect appropriate to mood  PELVIC DEFERRED  Assessment & Plan:   A:  1.  Urinary incontinence after back surgery  P:  1. Refer to [facility] for Urodynamics 2. Rx Vesicare 5 mg daily 3. F/U in 4 weeks  By signing my name below, I, De Burrs, attest that this documentation has been prepared under the direction and in the presence of Jonnie Kind, MD. Electronically Signed: De Burrs, Medical Scribe. 10/26/18. 2:07 PM.  I personally performed the services described in this documentation, which was SCRIBED in my presence. The recorded information has been reviewed and considered accurate. It has been edited as necessary during review. Jonnie Kind, MD

## 2018-10-26 NOTE — Patient Instructions (Signed)
Urinary Incontinence Urinary incontinence is the involuntary loss of urine from your bladder. What are the causes? There are many causes of urinary incontinence. They include:  Medicines.  Infections.  Prostatic enlargement, leading to overflow of urine from your bladder.  Surgery.  Neurological diseases.  Emotional factors.  What are the signs or symptoms? Urinary Incontinence can be divided into four types: 1. Urge incontinence. Urge incontinence is the involuntary loss of urine before you have the opportunity to go to the bathroom. There is a sudden urge to void but not enough time to reach a bathroom. 2. Stress incontinence. Stress incontinence is the sudden loss of urine with any activity that forces urine to pass. It is commonly caused by anatomical changes to the pelvis and sphincter areas of your body. 3. Overflow incontinence. Overflow incontinence is the loss of urine from an obstructed opening to your bladder. This results in a backup of urine and a resultant buildup of pressure within the bladder. When the pressure within the bladder exceeds the closing pressure of the sphincter, the urine overflows, which causes incontinence, similar to water overflowing a dam. 4. Total incontinence. Total incontinence is the loss of urine as a result of the inability to store urine within your bladder.  How is this diagnosed? Evaluating the cause of incontinence may require:  A thorough and complete medical and obstetric history.  A complete physical exam.  Laboratory tests such as a urine culture and sensitivities.  When additional tests are indicated, they can include:  An ultrasound exam.  Kidney and bladder X-rays.  Cystoscopy. This is an exam of the bladder using a narrow scope.  Urodynamic testing to test the nerve function to the bladder and sphincter areas.  How is this treated? Treatment for urinary incontinence depends on the cause:  For urge incontinence caused  by a bacterial infection, antibiotics will be prescribed. If the urge incontinence is related to medicines you take, your health care provider may have you change the medicine.  For stress incontinence, surgery to re-establish anatomical support to the bladder or sphincter, or both, will often correct the condition.  For overflow incontinence caused by an enlarged prostate, an operation to open the channel through the enlarged prostate will allow the flow of urine out of the bladder. In women with fibroids, a hysterectomy may be recommended.  For total incontinence, surgery on your urinary sphincter may help. An artificial urinary sphincter (an inflatable cuff placed around the urethra) may be required. In women who have developed a hole-like passage between their bladder and vagina (vesicovaginal fistula), surgery to close the fistula often is required.  Follow these instructions at home:  Normal daily hygiene and the use of pads or adult diapers that are changed regularly will help prevent odors and skin damage.  Avoid caffeine. It can overstimulate your bladder.  Use the bathroom regularly. Try about every 2-3 hours to go to the bathroom, even if you do not feel the need to do so. Take time to empty your bladder completely. After urinating, wait a minute. Then try to urinate again.  For causes involving nerve dysfunction, keep a log of the medicines you take and a journal of the times you go to the bathroom. Contact a health care provider if:  You experience worsening of pain instead of improvement in pain after your procedure.  Your incontinence becomes worse instead of better. Get help right away if:  You experience fever or shaking chills.  You are unable to   pass your urine.  You have redness spreading into your groin or down into your thighs. This information is not intended to replace advice given to you by your health care provider. Make sure you discuss any questions you have  with your health care provider. Document Released: 12/30/2004 Document Revised: 07/02/2016 Document Reviewed: 05/01/2013 Elsevier Interactive Patient Education  2018 [employer]  

## 2018-11-23 ENCOUNTER — Telehealth (HOSPITAL_COMMUNITY): Payer: Self-pay | Admitting: Licensed Clinical Social Worker

## 2018-11-23 ENCOUNTER — Ambulatory Visit: Payer: BLUE CROSS/BLUE SHIELD | Admitting: Obstetrics and Gynecology

## 2018-11-23 IMAGING — MR MR 3D RECON AT SCANNER
11 series · 16 of 16 positions shown · non-contrast
Comparison: CT 12/03/2016

CLINICAL DATA: Dilated common bile duct. Patient status post
cholecystectomy.

EXAM:
MRI ABDOMEN WITHOUT CONTRAST  (INCLUDING MRCP)
TECHNIQUE: Multiplanar multisequence MR imaging of the abdomen was performed.
Heavily T2-weighted images of the biliary and pancreatic ducts were
obtained, and three-dimensional MRCP images were rendered by post
processing.

[Series 3: T2 · coronal · 5.0mm · 1.14mm/px · 1 of 40 slices shown (1 of 3)]
[im 1/40]
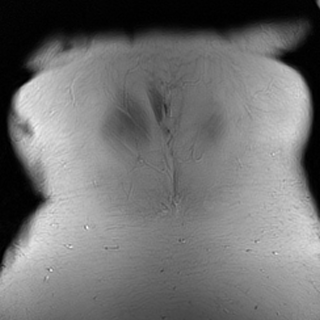

[Series 4: t2fs axial blade · axial · 4.0mm · 1.16mm/px · 1 of 52 slices shown]
[im 1/52]
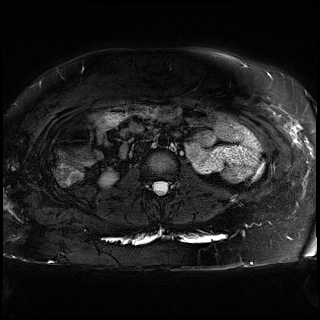

[Series 5: MRCP · coronal · 4.0mm · 0.78mm/px · 1 of 15 slices shown]
[im 1/15]
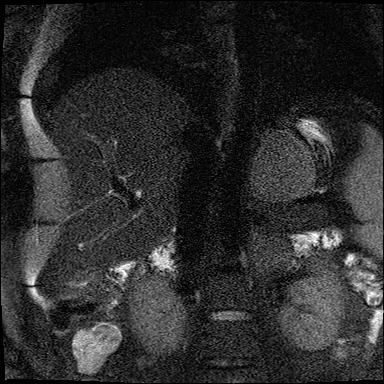

[Series 7: T2 · coronal · 1.2mm · 0.33mm/px · 1 of 104 slices shown (2 of 3)]
[im 1/104]
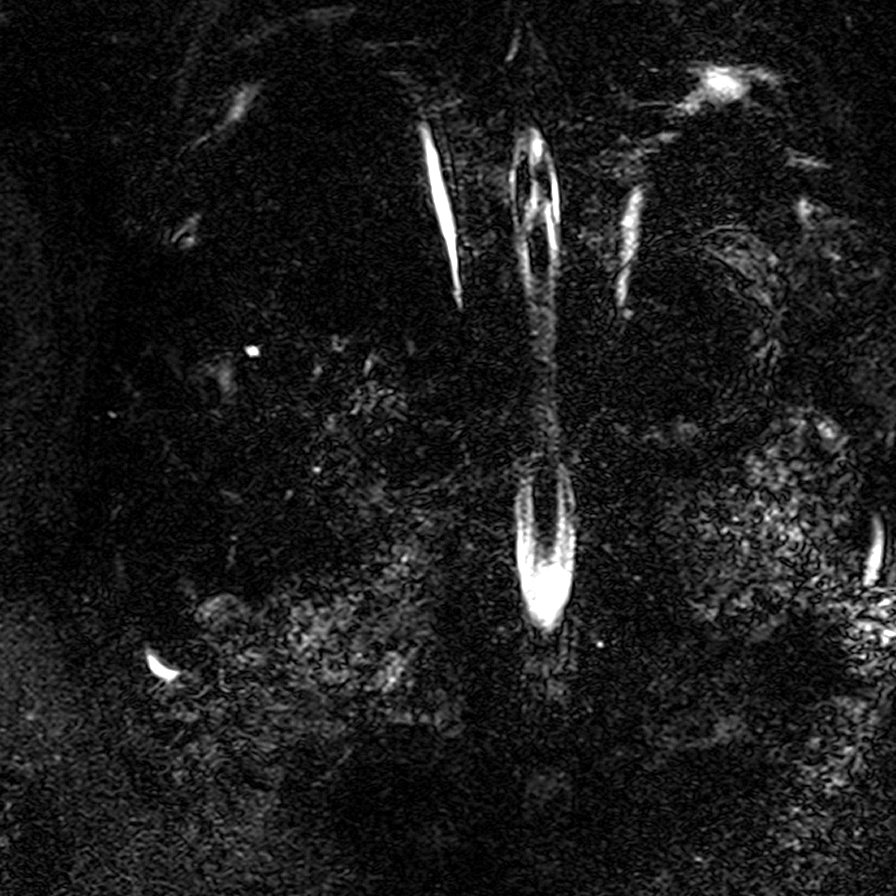

[Series 9: DWI · axial · 5.0mm · 0.93mm/px · z∈[-114,+156]mm · 2 of 92 slices shown (1 of 2)]
[im 1/92]
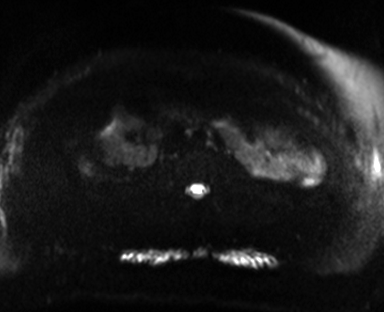
[im 92/92]
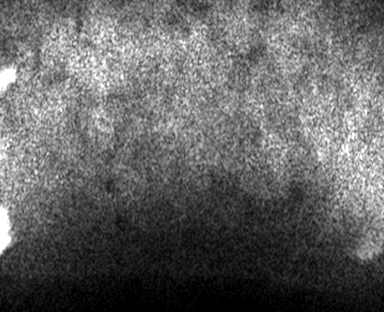

[Series 10: DWI · axial · 5.0mm · 0.93mm/px · 1 of 46 slices shown (2 of 2)]
[im 1/46]
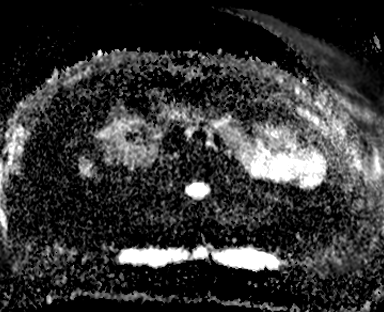

[Series 12: bSSFP · axial · 4.0mm · 1.48mm/px · z∈[-110,+166]mm · 2 of 70 slices shown]
[im 1/70]
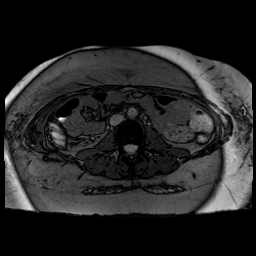
[im 70/70]
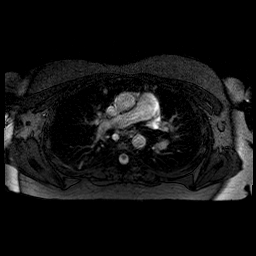

[Series 13: T1 · axial · 4.0mm · 0.59mm/px · z∈[-101,+151]mm · 2 of 64 slices shown (1 of 2)]
[im 1/64]
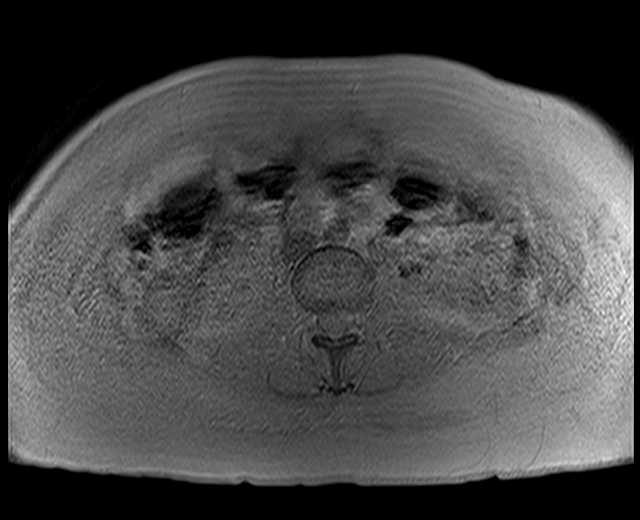
[im 64/64]
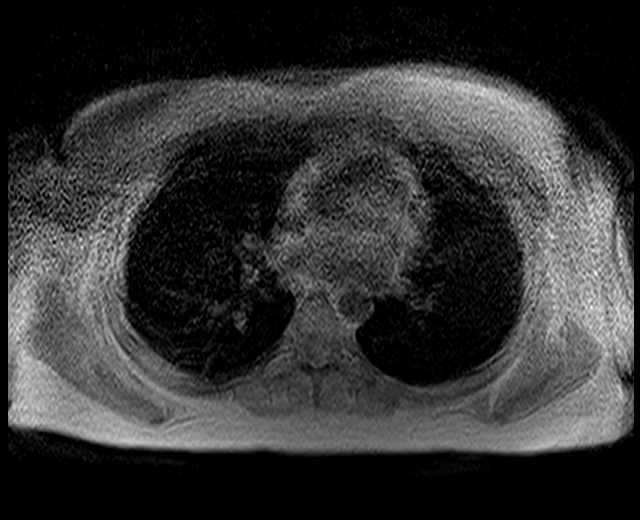

[Series 14: T1 · axial · 4.0mm · 0.59mm/px · z∈[-101,+151]mm · 2 of 64 slices shown (2 of 2)]
[im 1/64]
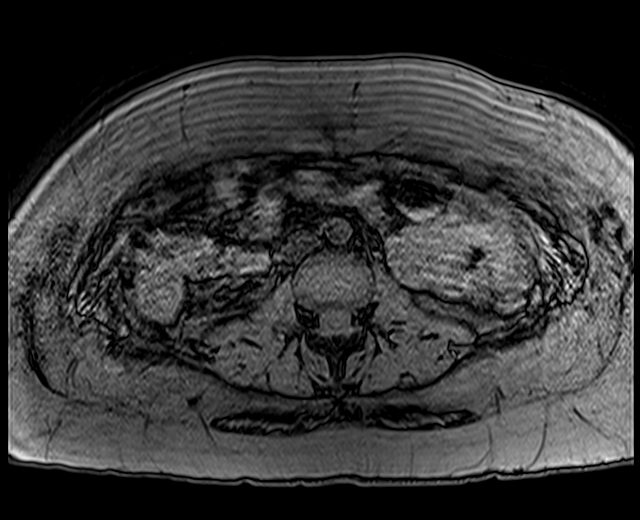
[im 64/64]
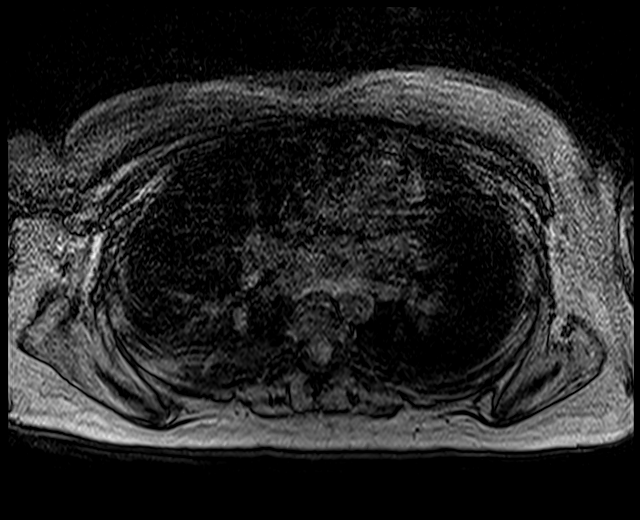

[Series 18: t1fs coronal · coronal · 3.0mm · 1.29mm/px · 2 of 84 slices shown]
[im 1/84]
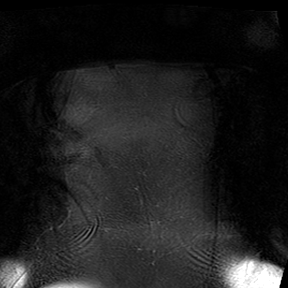
[im 84/84]
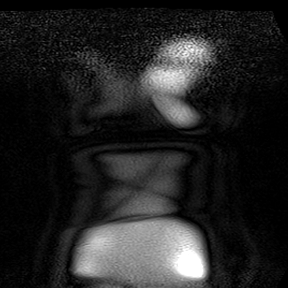

[Series 19: T2 · axial · 5.0mm · 1.48mm/px · 1 of 46 slices shown (3 of 3)]
[im 1/46]
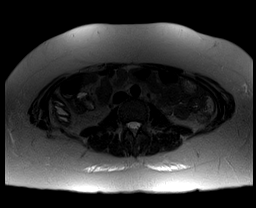

[16 of 16 positions shown; findings below may reference images not displayed]

FINDINGS: Lower chest:  Lung bases are clear.

Hepatobiliary: Mild intrahepatic duct dilatation. The common bile
duct measures 7 mm in diameter. There is a low insertion of the
cystic duct. No filling defect within the common bile duct. No
external compression.

There is no focal hepatic lesion. No hepatic steatosis. Patient
status post cholecystectomy.

Pancreas: Splenic parenchyma appears normal. There is no variant
ductal anatomy identified. No duct dilatation or inflammation.

Spleen: Normal spleen.

Adrenals/urinary tract: Adrenal glands and kidneys are normal.

Stomach/Bowel: Stomach and limited of the small bowel is
unremarkable

Vascular/Lymphatic: Abdominal aortic normal caliber. No
retroperitoneal periportal lymphadenopathy.

Musculoskeletal: No aggressive osseous lesion
IMPRESSION: 1. Mild intrahepatic and extrahepatic duct dilatation. Favor
sequelae of cholecystectomy.
2. Low insertion of the cystic duct remnant.
3. Normal pancreas without evidence ductal variation or
inflammation.
4. Normal hepatic parenchymal.

## 2018-11-23 NOTE — Telephone Encounter (Signed)
Returned call to pt concerning her interest in starting CD-IOP in this office. Counselor and pt discussed pt's situation and scheduled an appointment for Monday 12/23 at 4pm for CCA and orientation to CD-IOP.

## 2018-11-27 ENCOUNTER — Ambulatory Visit (INDEPENDENT_AMBULATORY_CARE_PROVIDER_SITE_OTHER): Payer: BLUE CROSS/BLUE SHIELD | Admitting: Licensed Clinical Social Worker

## 2018-11-27 DIAGNOSIS — F332 Major depressive disorder, recurrent severe without psychotic features: Secondary | ICD-10-CM

## 2018-11-27 DIAGNOSIS — F1110 Opioid abuse, uncomplicated: Secondary | ICD-10-CM | POA: Diagnosis not present

## 2018-12-04 ENCOUNTER — Encounter (HOSPITAL_COMMUNITY): Payer: Self-pay | Admitting: Licensed Clinical Social Worker

## 2018-12-04 NOTE — Progress Notes (Signed)
Comprehensive Clinical Assessment (CCA) Note  12/04/2018 Penny [name] [id number]  Visit Diagnosis:      ICD-10-CM   1. 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 Opioid use disorder, mild, abuse ([place]) F11.10       CCA Part One  Part One has been completed on paper by the patient.  (See scanned document in Chart Review)  CCA Part Two A  Intake/Chief Complaint:  CCA Intake With Chief Complaint CCA Part Two Date: 11/27/18 CCA Part Two Time: 59 Chief Complaint/Presenting Problem: Need therapists, medication Patients Currently Reported Symptoms/Problems: Depression, tearfulness, chronic pain, unable to work, divorce, loss of interest, irritability, sleep changes, low energy, panic attacks Individual's Strengths: good mother, trained and worked as EMT, walking despite severe back injury Individual's Preferences: needs intensive treatment Type of Services Patient Feels Are Needed: PHP Initial Clinical Notes/Concerns: Pt weighed over 300lbs. and received gastric bypass surgery at age 89. Long hx of medical complications including complicated pregnancy/delivery.  Mental Health Symptoms Depression:  Depression: Change in energy/activity, Difficulty Concentrating, Fatigue, Hopelessness, Irritability, Sleep (too much or little), Tearfulness, Worthlessness  Mania:     Anxiety:   Anxiety: Difficulty concentrating, Fatigue, Irritability, Restlessness, Sleep, Tension, Worrying  Psychosis:     Trauma:     Obsessions:     Compulsions:     Inattention:     Hyperactivity/Impulsivity:     Oppositional/Defiant Behaviors:     Borderline Personality:     Other Mood/Personality Symptoms:      Mental Status Exam Appearance and self-care  Stature:  Stature: Average  Weight:     Clothing:  Clothing: Neat/clean  Grooming:  Grooming: Well-groomed  Cosmetic use:  Cosmetic Use: Age appropriate  Posture/gait:  Posture/Gait: Normal  Motor activity:   Motor Activity: Not Remarkable  Sensorium  Attention:  Attention: Normal  Concentration:  Concentration: Normal, Anxiety interferes  Orientation:  Orientation: X5  Recall/memory:  Recall/Memory: Normal  Affect and Mood  Affect:  Affect: Depressed, Tearful  Mood:  Mood: Depressed  Relating  Eye contact:  Eye Contact: Normal  Facial expression:  Facial Expression: Depressed, Sad  Attitude toward examiner:  Attitude Toward Examiner: Cooperative  Thought and Language  Speech flow: Speech Flow: Normal  Thought content:  Thought Content: Appropriate to mood and circumstances  Preoccupation:     Hallucinations:     Organization:     Transport planner of Knowledge:  Fund of Knowledge: Average  Intelligence:  Intelligence: Average  Abstraction:  Abstraction: Normal  Judgement:  Judgement: Normal  Reality Testing:  Reality Testing: Adequate  Insight:  Insight: Fair  Decision Making:  Decision Making: Only simple  Social Functioning  Social Maturity:  Social Maturity: Responsible  Social Judgement:  Social Judgement: Normal  Stress  Stressors:  Stressors: Family conflict  Coping Ability:  Coping Ability: Deficient supports  Skill Deficits:     Supports:      Family and Psychosocial History: Family history Marital status: Divorced Divorced, when?: 2nd Divorce made official in December 2019 What types of issues is patient dealing with in the relationship?: "My 2nd exhusband just left, he walked out w/o warning". Are you sexually active?: No What is your sexual orientation?: heterosexual Does patient have children?: Yes How many children?: 2 How is patient's relationship with their children?: "Good. I want to be a good mother for them"  Childhood History:  Childhood History By whom was/is the patient raised?: Both parents Description of patient's relationship  with caregiver when they were a child: Had a good relationship with both parents, who were together until 2 years  ago, divorcing after 10 years of marriage Patient's description of current relationship with people who raised him/her: "I'm a daddy's girl. Mother and I are alike and therefore but heads" Number of Siblings: 1 Description of patient's current relationship with siblings: Brother is a "jerk" Did patient suffer any verbal/emotional/physical/sexual abuse as a child?: No Did patient suffer from severe childhood neglect?: No Has patient ever been sexually abused/assaulted/raped as an adolescent or adult?: No Was the patient ever a victim of a crime or a disaster?: No Witnessed domestic violence?: No Has patient been effected by domestic violence as an adult?: No  CCA Part Two B  Employment/Work Situation: Employment / Work Copywriter, advertising Employment situation: On disability Why is patient on disability: Workers Comp How long has patient been on disability: 6 months  Education: Education Did Teacher, adult education From [employer]?: Yes  Religion:    Leisure/Recreation: Leisure / Recreation Leisure and Hobbies: fixing cars, racing  Exercise/Diet: Exercise/Diet Do You Have Any Trouble Sleeping?: Yes Explanation of Sleeping Difficulties: Trouble staying asleep  CCA Part Two C  Alcohol/Drug Use: Alcohol / Drug Use History of alcohol / drug use?: Yes Substance #1 Name of Substance 1: Oxycontin 1 - Age of First Use: 32 1 - Amount (size/oz): 20 mg 1 - Frequency: daily 1 - Duration: last 1-2 years 1 - Last Use / Amount: Today, Pt reports she has a px for small amount of pain killers and her mother distrubutes them. pt does not plan to continue opiates "after this bottle is finished" Substance #2 Name of Substance 2: Suboxone 2 - Age of First Use: 32 2 - Frequency: daily 2 - Duration: 2 months 2 - Last Use / Amount: September 2019 Substance #3 Name of Substance 3: Marijauna 3 - Age of First Use: unknown 3 - Amount (size/oz): "a little bit" 3 - Frequency: "occassionally" 3 - Duration:  "last few years"                CCA Part Three  ASAM's:  Six Dimensions of Multidimensional Assessment  Dimension 1:  Acute Intoxication and/or Withdrawal Potential:     Dimension 2:  Biomedical Conditions and Complications:     Dimension 3:  Emotional, Behavioral, or Cognitive Conditions and Complications:     Dimension 4:  Readiness to Change:     Dimension 5:  Relapse, Continued use, or Continued Problem Potential:     Dimension 6:  Recovery/Living Environment:      Substance use Disorder (SUD) Substance Use Disorder (SUD)  Checklist Symptoms of Substance Use: Continued use despite having a persistent/recurrent physical/psychological problem caused/exacerbated by use, Continued use despite persistent or recurrent social, interpersonal problems, caused or exacerbated by use, Evidence of tolerance  Social Function:  Social Functioning Social Maturity: Responsible Social Judgement: Normal  Stress:  Stress Stressors: Family conflict Coping Ability: Deficient supports Priority Risk: Low Acuity  Risk Assessment- Self-Harm Potential: Risk Assessment For Self-Harm Potential Thoughts of Self-Harm: No current thoughts Method: No plan  Risk Assessment -Dangerous to Others Potential: Risk Assessment For Dangerous to Others Potential Method: No Plan Availability of Means: No access or NA  DSM5 Diagnoses: Patient Active Problem List   Diagnosis Date Noted  . Urge incontinence 10/26/2018  . S/P kyphoplasty 08/02/2018  . Herpes genitalis in women 12/19/2017  . Abnormal urine odor 03/30/2017  . Urinary tract infection with hematuria 03/30/2017  .  Screening for genitourinary condition 03/30/2017  . Pain 03/30/2017  . Burning with urination 03/30/2017  . Family history of breast cancer in mother,uncertain BR CA status 02/28/2017  . Fibroid 01/18/2017  . Chronic pain syndrome 12/05/2016  . Folate deficiency 12/04/2016  . Nausea vomiting and diarrhea 12/04/2016  . Severe  anemia 12/04/2016  . Symptomatic anemia 12/03/2016  . Complaints of leg weakness   . Paresthesia   . Left-sided weakness   . Uncontrolled pain 01/24/2016  . Malnutrition of moderate degree 01/24/2016  . Neuropathy 01/23/2016  . Inability to walk 01/23/2016  . Paresthesias   . Bilateral leg numbness 11/26/2015  . Major depressive disorder, recurrent episode, 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[place]) 05/10/2015  . Post partum depression 05/09/2015  . Anastomotic ulcer   . Dysphagia   . Hematemesis 04/07/2015  . Intractable vomiting with nausea 04/07/2015  . Elevated liver enzymes   . Epigastric pain   . 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 Seizure-like activity ([place]) 02/22/2015  . Fibromyalgia 02/18/2015  . Vulvar fissure 02/18/2015  . Clinical depression 02/01/2015  . Current smoker 01/29/2015  . Current tobacco use 01/29/2015  . 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 Leg weakness, bilateral 01/02/2015  . Gait disorder 01/02/2015  . Other symptoms and signs involving the musculoskeletal system 01/02/2015  . Abnormal gait 01/02/2015  . Cervicalgia 12/18/2014  . Sciatica of left side 11/14/2014  . Neuralgia neuritis, sciatic nerve 11/14/2014  . 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 Encounter for sterilization 09/09/2014  . Abdominal pain, acute, right lower quadrant 08/22/2014  . Headache, migraine 08/19/2014  . Seizure ([place]) 08/18/2014  . Encephalopathy 08/13/2014  . Headache 08/13/2014  . Altered mental status 08/13/2014  . Right leg numbness 07/31/2014  . Disturbance of skin sensation 07/31/2014  . Hypertension in pregnancy, transient 07/08/2014  . Previous gastric bypass affecting pregnancy, antepartum 05/22/2014  . 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 ASD (atrial septal defect), ostium secundum 05/17/2014  . Depression complicating pregnancy in second trimester, antepartum 05/09/2014  . Antepartum mental disorder in pregnancy 05/09/2014   . Rapid palpitations 02/18/2014  . H/O maternal third degree perineal laceration, currently pregnant 02/18/2014  . High-risk pregnancy 02/18/2014  . Supervision of pregnancy with other poor reproductive or obstetric history, unspecified trimester 02/18/2014  . Restless legs 01/16/2014  . Restless leg 01/16/2014  . Chronic interstitial cystitis 10/23/2013  . Menorrhagia 07/18/2013  . Excessive and frequent menstruation 07/18/2013  . h/o Opiate addiction 03/11/2012    Class: Acute  . History of migraine headaches 03/10/2012    Class: Acute  . Depression with anxiety 03/10/2012    Class: Chronic  . 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 Panic disorder without agoraphobia 03/10/2012  . H/O disease 03/10/2012  . Dysthymia 03/10/2012  . Pelvic congestion syndrome 10/13/2011  . Coitalgia 10/13/2011  . Chronic migraine without aura 10/13/2011  . Unspecified dyspareunia 10/13/2011  . IBS (irritable bowel syndrome) 08/25/2011  . Diarrhea 05/27/2011  . OBESITY, UNSPECIFIED 09/17/2010  . Transaminitis 09/17/2010  . Anemia, iron deficiency 07/23/2010    Patient Centered Plan: Patient is on the following Treatment Plan(s):  Depression  Recommendations for Services/Supports/Treatments: Recommendations for Services/Supports/Treatments Recommendations For Services/Supports/Treatments: Partial Hospitalization  Treatment Plan Summary:    Referrals to Alternative Service(s): Referred to Alternative Service(s):   Place:   Date:  Time:    Referred to Alternative Service(s):   Place:   Date:   Time:    Referred to Alternative Service(s):   Place:   Date:   Time:    Referred to Alternative Service(s):   Place:   Date:   Time:     Archie Balboa

## 2018-12-06 IMAGING — CR DG CHEST 2V
3 series · 3 of 3 positions shown · non-contrast
Comparison: 08/31/2016

CLINICAL DATA: Chest pain and dizziness.

EXAM:
CHEST  2 VIEW

[w chest pa]
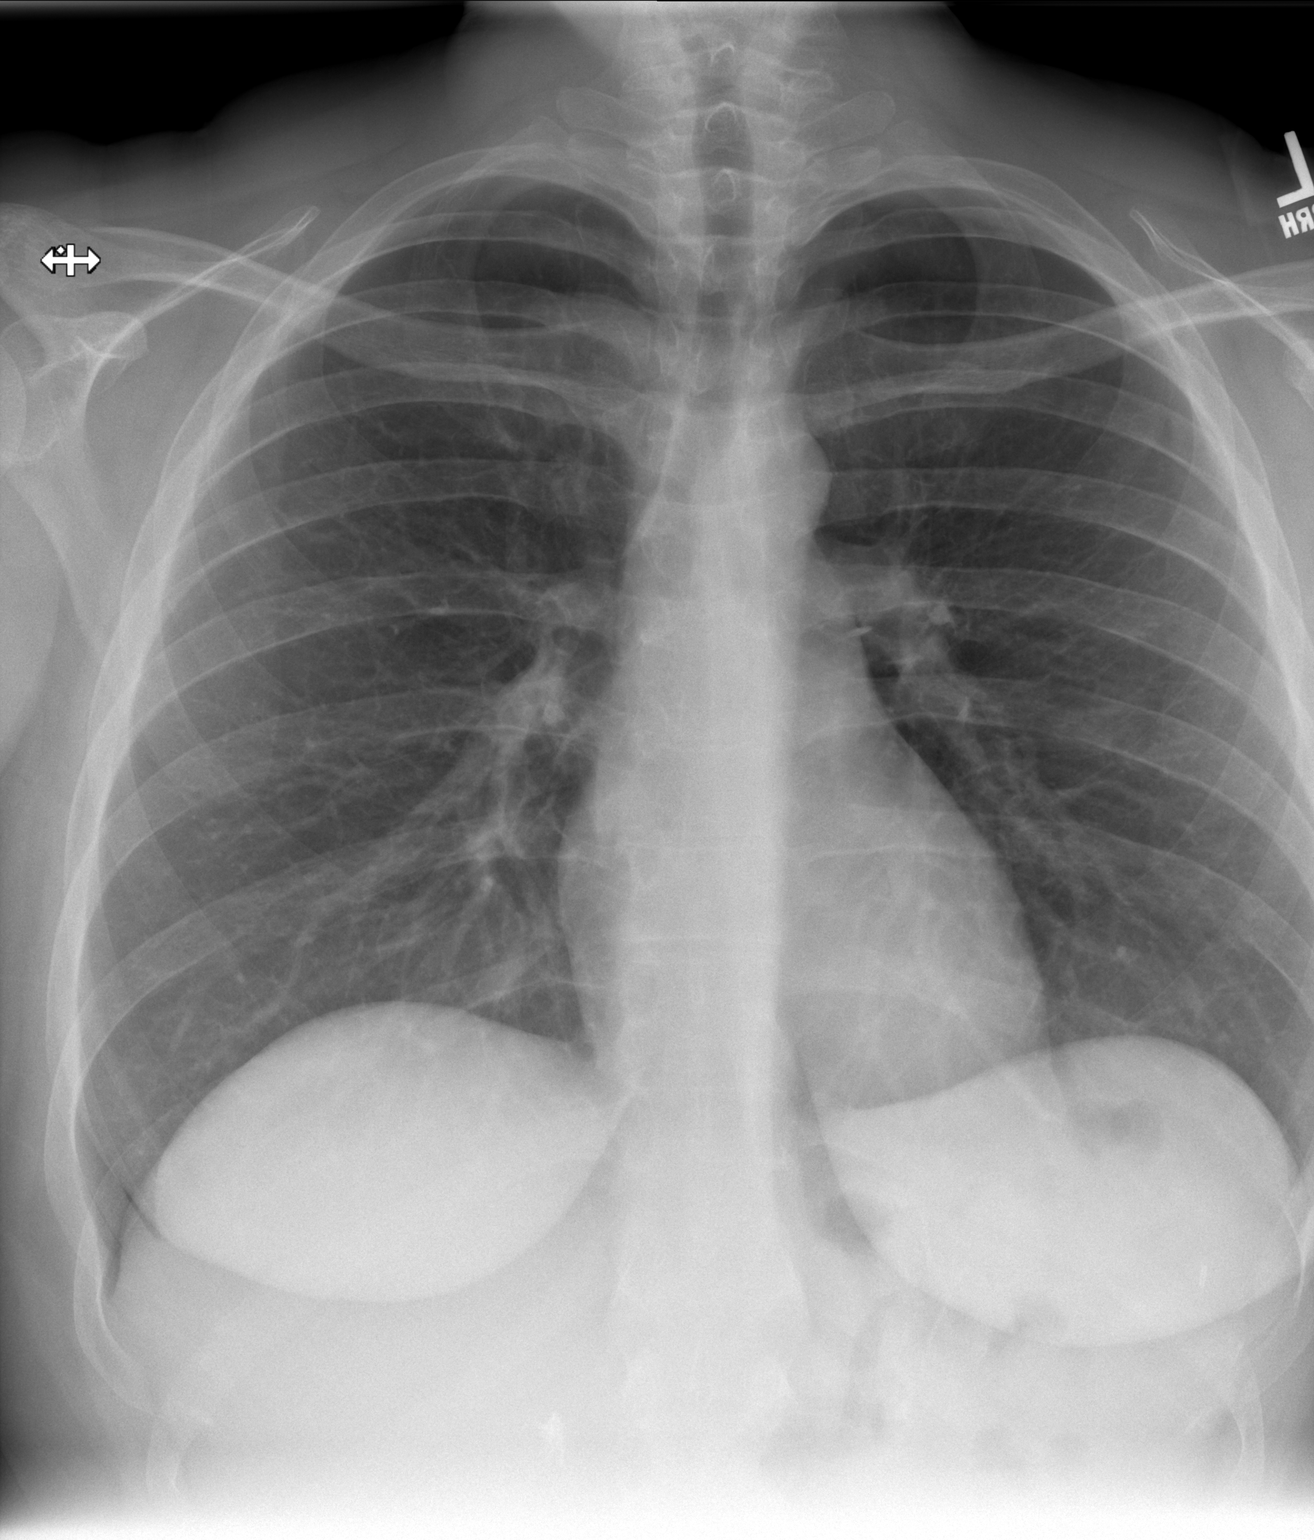

[w chest lat (1 of 2)]
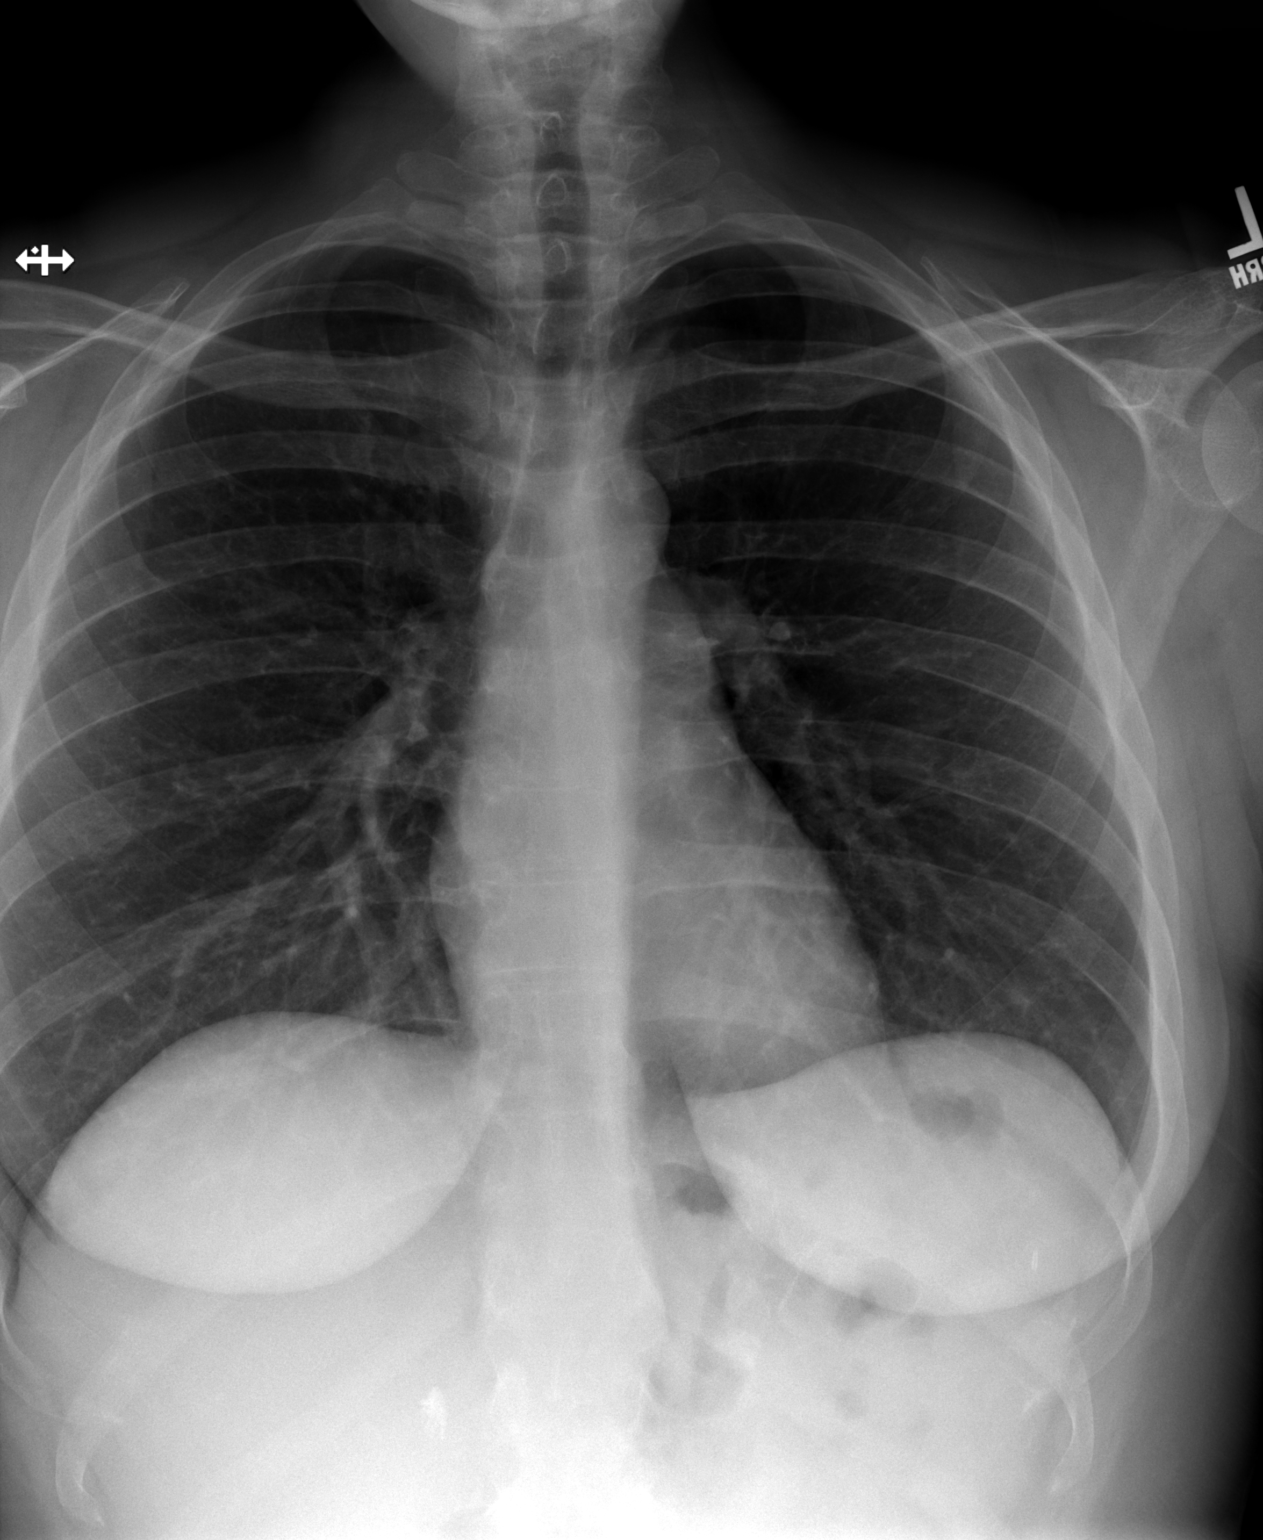

[w chest lat (2 of 2)]
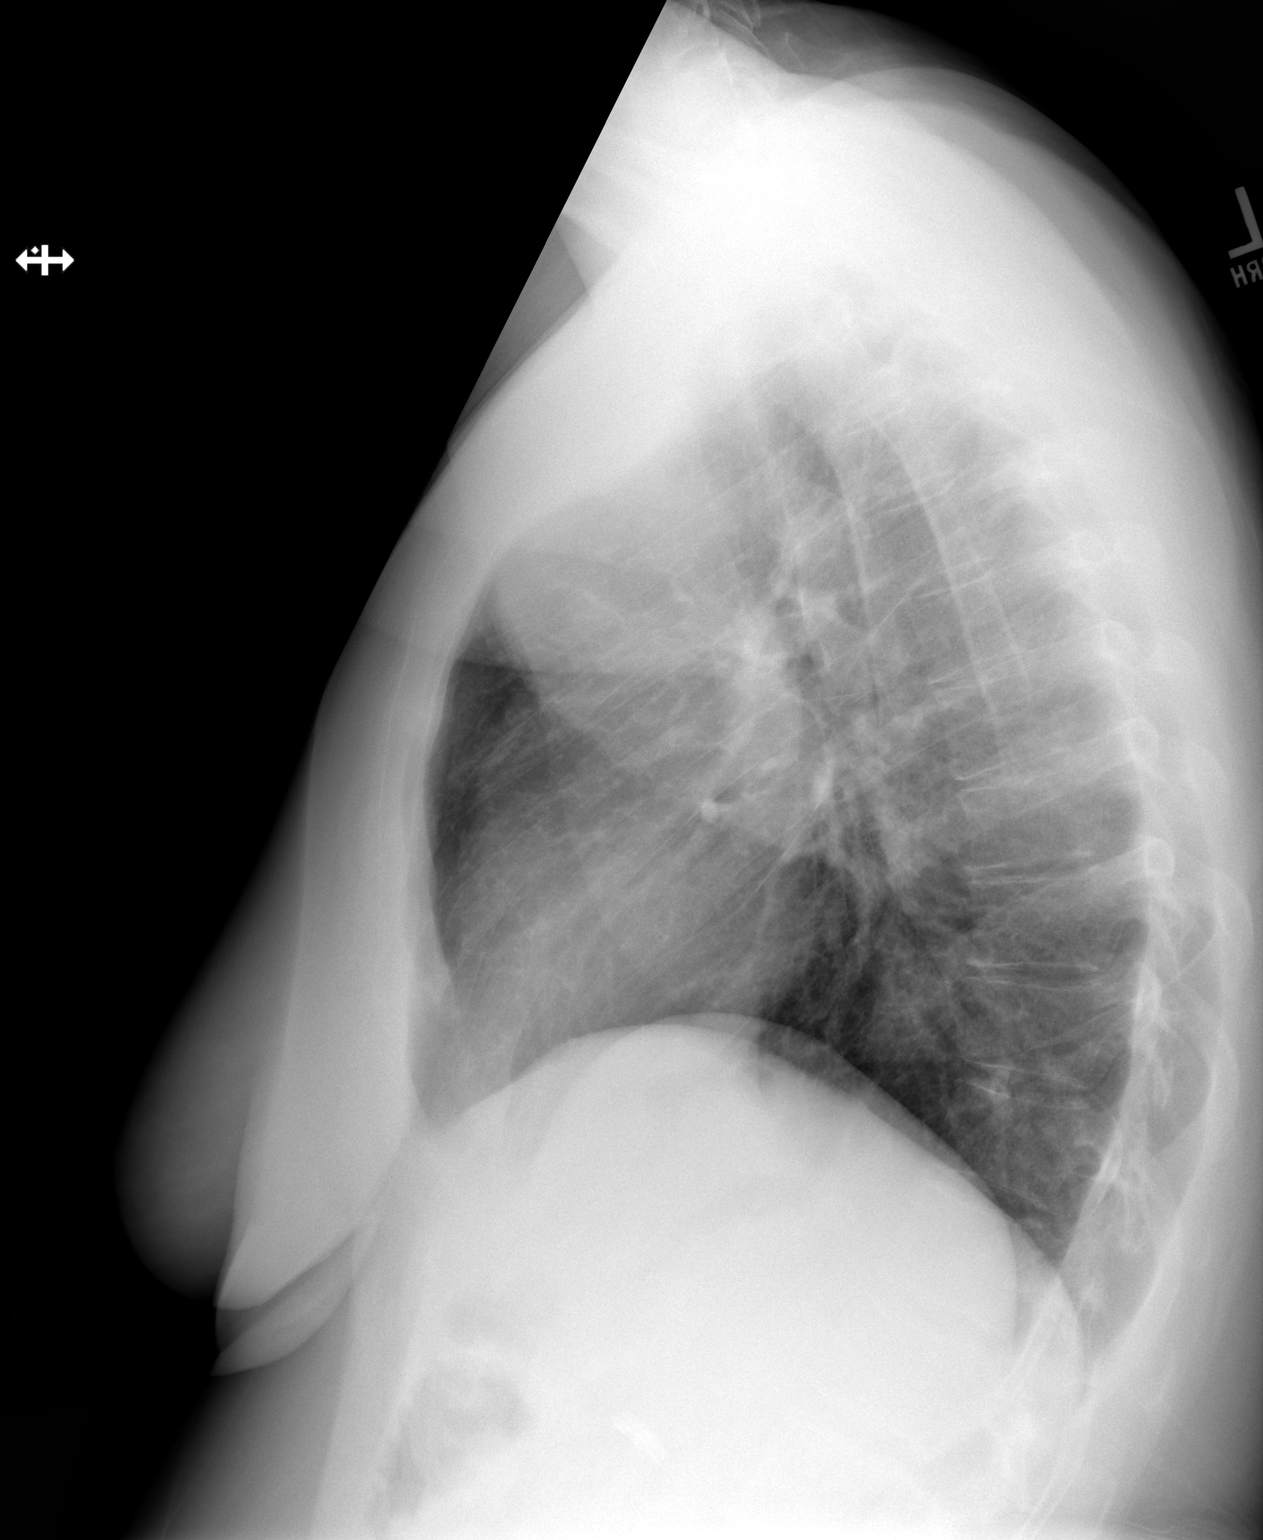

[3 of 3 positions shown; findings below may reference images not displayed]

FINDINGS: The heart size and mediastinal contours are within normal limits.
Both lungs are clear. The visualized skeletal structures are
unremarkable.
IMPRESSION: Normal chest.

## 2018-12-08 ENCOUNTER — Other Ambulatory Visit (HOSPITAL_COMMUNITY): Payer: BLUE CROSS/BLUE SHIELD | Attending: Psychiatry | Admitting: Licensed Clinical Social Worker

## 2018-12-08 DIAGNOSIS — F1110 Opioid abuse, uncomplicated: Secondary | ICD-10-CM

## 2018-12-08 DIAGNOSIS — F411 Generalized anxiety disorder: Secondary | ICD-10-CM

## 2018-12-08 DIAGNOSIS — F332 Major depressive disorder, recurrent severe without psychotic features: Secondary | ICD-10-CM

## 2018-12-08 NOTE — Psych (Signed)
Stefanee Mckell Warr is a 33 y.o. female patient with depression and anxiety symptoms with history of substance use disorder. Cln met with pt to orient to [facility] and gain further information on pt's symptoms and current functioning. Pt was referred to [facility] by cln Youlanda Roys after CCA to assess for appropriateness in CD-IOP within this agency.  Pt presents as tearful throughout session, with splotchy complexion, slight shaky-ness, slumped posture, poor eye contact, and soft tone of voice and paucity. Pt is not especially forthcoming or elaborative.  Pt states she has been having "massive panic attacks" for the past week and was currently experiencing one. Pt's presentation was not consistent with panic attack, however was indicative of escalated anxiety. Pt was resistant to cln's attempts to de-escalate and chose to breathe on her own and "deal with it." Pt states she used to be on medication for anxiety which was helpful, however has now been off that medication for 3 months. Pt states she entered a suboxone clinic in September to get off of opiate pain medication which was prescribed for her back injury, but for which she struggled to take appropriately. Pt states she was taking Cymbalta, Vraylar, Klonopin, and Adderall prior to the [place] clinic, however was taken off Klonopin and Adderall in order to participate. These are the medications pt is desirous to restart, stating it has been a "struggle ever since" stopping. Pt reports no awareness of triggers or why her anxiety has escalated over the past week to "unbearable" levels. Pt reports she is sleeping no more than 3 hours a night, appetite is poor with 0-1 meal a day, low motivation, lethargy, anhedonia, and inability to get chores done around the house. Pt reports continued decline in functioning over the past month and being able to get the bare necessities done, but nothing more. Pt reports continued complications of depression due to unresolved issues with husband  walking out, a work place injury that has made her unable to work and left her in chronic pain, as well as the suddenness and short time space in which it all occurred. Pt reports support in best friend and limited support from mom. Pt denies any coping ability. Pt shares she is "terrified" of her current state.  Pt reports goals of medication adjustments and wanting "to be happy again." Pt denies SI/HI/AVH currently however does share thoughts indicative of passive SI ie "I don't know how long I can keep doing this."  Cln completed PHP orientation and pt reports desire and willingness to engage in this treatment. Pt signed intake paperwork and denies any further questions or concerns. Pt is educated on crisis numbers. Pt will begin PHP on Monday 12/11/18 at [place], LCSW

## 2018-12-11 ENCOUNTER — Other Ambulatory Visit (HOSPITAL_COMMUNITY): Payer: BLUE CROSS/BLUE SHIELD | Admitting: Licensed Clinical Social Worker

## 2018-12-11 ENCOUNTER — Other Ambulatory Visit (HOSPITAL_COMMUNITY): Payer: BLUE CROSS/BLUE SHIELD | Admitting: Occupational Therapy

## 2018-12-11 DIAGNOSIS — R4589 Other symptoms and signs involving emotional state: Secondary | ICD-10-CM

## 2018-12-11 DIAGNOSIS — F332 Major depressive disorder, recurrent severe without psychotic features: Secondary | ICD-10-CM

## 2018-12-11 DIAGNOSIS — F411 Generalized anxiety disorder: Secondary | ICD-10-CM | POA: Diagnosis not present

## 2018-12-11 DIAGNOSIS — F070 Personality change due to known physiological condition: Secondary | ICD-10-CM

## 2018-12-12 ENCOUNTER — Encounter (HOSPITAL_COMMUNITY): Payer: Self-pay | Admitting: Occupational Therapy

## 2018-12-12 ENCOUNTER — Encounter (HOSPITAL_COMMUNITY): Payer: Self-pay | Admitting: Family

## 2018-12-12 ENCOUNTER — Other Ambulatory Visit (HOSPITAL_COMMUNITY): Payer: Self-pay

## 2018-12-12 ENCOUNTER — Other Ambulatory Visit: Payer: Self-pay

## 2018-12-12 ENCOUNTER — Other Ambulatory Visit (HOSPITAL_COMMUNITY): Payer: BLUE CROSS/BLUE SHIELD | Admitting: Occupational Therapy

## 2018-12-12 ENCOUNTER — Other Ambulatory Visit (HOSPITAL_COMMUNITY): Payer: BLUE CROSS/BLUE SHIELD | Admitting: Licensed Clinical Social Worker

## 2018-12-12 ENCOUNTER — Encounter (HOSPITAL_COMMUNITY): Payer: Self-pay

## 2018-12-12 VITALS — BP 118/76 | HR 96 | Ht 65.25 in | Wt 163.0 lb

## 2018-12-12 DIAGNOSIS — R4589 Other symptoms and signs involving emotional state: Secondary | ICD-10-CM

## 2018-12-12 DIAGNOSIS — F411 Generalized anxiety disorder: Secondary | ICD-10-CM

## 2018-12-12 DIAGNOSIS — F332 Major depressive disorder, recurrent severe without psychotic features: Secondary | ICD-10-CM | POA: Diagnosis not present

## 2018-12-12 DIAGNOSIS — Z79899 Other long term (current) drug therapy: Secondary | ICD-10-CM

## 2018-12-12 DIAGNOSIS — F070 Personality change due to known physiological condition: Secondary | ICD-10-CM

## 2018-12-12 MED ORDER — DOXEPIN HCL 10 MG PO CAPS: 10.0000 mg | ORAL_CAPSULE | Freq: Every day | ORAL | 0 refills | Status: DC

## 2018-12-12 MED ORDER — DULOXETINE HCL 60 MG PO CPEP: 60.0000 mg | ORAL_CAPSULE | Freq: Every day | ORAL | 0 refills | Status: DC

## 2018-12-12 MED ORDER — CARIPRAZINE HCL 1.5 MG PO CAPS: 1.5000 mg | ORAL_CAPSULE | Freq: Every day | ORAL | 0 refills | Status: DC

## 2018-12-12 MED ORDER — DULOXETINE HCL 60 MG PO CPEP: 60.0000 mg | ORAL_CAPSULE | Freq: Every evening | ORAL | 0 refills | Status: DC | PRN

## 2018-12-12 MED ORDER — HYDROXYZINE PAMOATE 25 MG PO CAPS: 25.0000 mg | ORAL_CAPSULE | Freq: Three times a day (TID) | ORAL | 0 refills | Status: DC | PRN
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
## 2018-12-12 NOTE — Progress Notes (Signed)
Behavioral Health Partial Program Assessment Note  Date: 12/12/2018 Name: Penny [name] MRN: [id number]   HPI: Penny [name] is a 33 y.o. Caucasian female presents with depression and anxiety.  Patient present with a flat and guarded affect.  [name] a history of depression, anxiety and ADHD.  [name] she was followed by MD Harrington Challenger patient was prescribed Cymbalta, Vralar,  Klonopin and Adderall however had recently discontinued services due to admitting herself into Suboxone detox program. 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  However [name] she was followed by Suboxone clinic more recently and is no longer taken "strong meds"  patient [name] she has recently run out of her Cymbalta and Vraylar as she states she took her last one this morning.  NP will refill Vraylar and Cymbalta.  Patient is requesting to be restarted on Adderall Klonopin for anxiety and ADHD.  Discussed alternative medications.  Patient appeared to be agreeable to plan with some resistance to initiating new medications.   Penny [name] [name] she has a difficult time focusing on day-to-day tasks. Penny [name] [name] a recent separation from her husband, where she was recently served with divorce papers.  [name] she has 2 children and resides with her mother and her stepfather.  [name] a strained relationship between she and her mother.   Patient denies suicidal or homicidal ideations during this assessment. [name] previous inpatient admission in 2008 2016 for major depression and suicidal ideation.  [name] a family history of mental illness.  [name] her Mother diagnosed with depression/ ADD and brother: Bipolar and ADHD. Was reported by nursing staff patient has hx of pseudoseizures.  [name] she was prescribed Tegretol and Topamax-  However was reported symptoms have resolved.  patient was enrolled in partial psychiatric program on 12/12/18.  Primary  complaints include: anxiety, difficulty sleeping, feeling depressed, poor concentration and problem with medication.  Onset of symptoms was gradual with gradually worsening course since that time. Psychosocial Stressors include the following: financial. Patient [name] dealing with " negative self reflection."   I have reviewed the following documentation dated 12/12/2018: past psychiatric history, past medical history and past Review of systems  Reassessment on 12/13/2018 Penny [name] " I have not been totally honest with you."  I have been taking all medications i.e. Adderall and Klonopin.  Patient is requesting to be restarted on these medications.  Discussed initiating Prozac 10 mg  (titration) and continue Vistaril for anxiety reported symptoms.  Patient continues to have apprehension and objections with starting new medications. "  I know does not go to work me" [name]  "I do not have money to waist on medications."  Patient was encouraged to initiate current medication regiment and continue partial hospitalization program for additional coping skills.   Complaints of Pain: nonear Past Psychiatric History:  Past psychiatric hospitalizations  and Past medication trials   Currently in treatment with .  Substance Abuse History: narcotics Use of Alcohol: denied Use of Caffeine: denies use Use of over the counter:   Past Surgical History:  Procedure Laterality Date  . BIOPSY  04/10/2015   Procedure: BIOPSY;  Surgeon: Daneil Dolin, MD;  Location: [facility];  Service: Endoscopy;;  . cath self every nite     for sodium bicarb injection (discontinued 2013)  . CHOLECYSTECTOMY  2005   biliary dyskinesia  . COLONOSCOPY  JUN 2012 ABD PN/DIARRHEA WITH PROPOFOL   NL COLON  . DILATION AND CURETTAGE OF  UTERUS    . DILITATION & CURRETTAGE/HYSTROSCOPY WITH NOVASURE ABLATION N/A 03/24/2017   Procedure: DILATATION & CURETTAGE/HYSTEROSCOPY WITH NOVASURE ENDOMETRIAL ABLATION;  Surgeon: Jonnie Kind,  MD;  Location: [facility];  Service: Gynecology;  Laterality: N/A;  . ESOPHAGEAL DILATION N/A 04/10/2015   Procedure: ESOPHAGEAL DILATION WITH 54FR MALONEY DILATOR;  Surgeon: Daneil Dolin, MD;  Location: [facility];  Service: Endoscopy;  Laterality: N/A;  . ESOPHAGOGASTRODUODENOSCOPY    . ESOPHAGOGASTRODUODENOSCOPY (EGD) WITH PROPOFOL N/A 04/10/2015   Procedure: ESOPHAGOGASTRODUODENOSCOPY (EGD) WITH PROPOFOL;  Surgeon: Daneil Dolin, MD;  Location: [facility];  Service: Endoscopy;  Laterality: N/A;  . ESOPHAGOGASTRODUODENOSCOPY (EGD) WITH PROPOFOL N/A 12/06/2016   Procedure: ESOPHAGOGASTRODUODENOSCOPY (EGD) WITH PROPOFOL;  Surgeon: Danie Binder, MD;  [facility]: [facility];  Service: Endoscopy;  Laterality: N/A;  . GAB  2007   in [place]-POUCH 5 CM  . GASTRIC BYPASS  06/2006  . HYSTEROSCOPY W/D&C N/A 09/12/2014   Procedure: DILATATION AND CURETTAGE /HYSTEROSCOPY;  Surgeon: Jonnie Kind, MD;  Location: [facility];  Service: Gynecology;  Laterality: N/A;  . KYPHOPLASTY N/A 08/02/2018   Procedure: KYPHOPLASTY T12;  Surgeon: Melina Schools, MD;  Location: [facility];  Service: Orthopedics;  Laterality: N/A;  60 mins  . LAPAROSCOPIC TUBAL LIGATION Bilateral 03/24/2017   Procedure: LAPAROSCOPIC TUBAL LIGATION ([place] Rings);  Surgeon: Jonnie Kind, MD;  Location: [facility];  Service: Gynecology;  Laterality: Bilateral;  . REPAIR VAGINAL CUFF N/A 07/30/2014   Procedure: REPAIR VAGINAL CUFF;  Surgeon: Mora Bellman, MD;  Location: [facility];  Service: Gynecology;  Laterality: N/A;  . SAVORY DILATION  06/20/2012   Dr. Barnie Alderman gastritis/Ulcer in the mid jejunum. Empiric dilation.   . SURAL NERVE BX Left 02/25/2016   Procedure: LEFT SURAL NERVE BIOPSY;  Surgeon: Jovita Gamma, MD;  Location: [facility];  Service: Neurosurgery;  Laterality: Left;  Left sural nerve biopsy  . TONSILLECTOMY    . TONSILLECTOMY AND ADENOIDECTOMY    . UPPER GASTROINTESTINAL ENDOSCOPY  JULY 2011 NAUSEA-D125,V6, PH 25   Bx; GASTRITIS,  POUCH-5 CM LONG  . WISDOM TOOTH EXTRACTION      Past Medical History:  Diagnosis Date  . ADD (attention deficit disorder)   . Anemia 2011   2o to GASTRIC BYPASS  . Anginal pain ([place])   . Anxiety   . Blood transfusion without reported diagnosis   . Chronic daily headache   . Depression   . Depression   . Dysmenorrhea 09/18/2013  . Dysrhythmia   . Elevated liver enzymes JUL 2011ALK PHOS 111-127 AST  143-267 ALT  213-321T BILI 0.6  ALB  3.7-4.06 Jun 2011 [facility] PHOS 118 AST 24 ALT 42 T BILI 0.4 ALB 3.9  . Encounter for drug rehabilitation    at behavioral health for opioid addiction about 4 years ago  . Family history of adverse reaction to anesthesia    'dad had to be kept on pump for breathing for morphine'  . Fatty liver   . Fibroid 01/18/2017  . Fibromyalgia   . Gastric bypass status for obesity   . Gastritis JULY 2011  . Heart murmur   . 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 History of Holter monitoring   . Hx of opioid abuse ([place])    for about 4 years, about 4 years ago  . Interstitial cystitis   . Iron deficiency anemia 07/23/2010  . Irritable bowel syndrome 2012 DIARRHEA   JUN 2012 TTG IgA 14.9  .  IUD FEB 2010  . Lupus ([place])   . Menorrhagia 07/18/2013  . Migraines   . Obesity (BMI 30-39.9) 2011 228 LBS BMI 36.8  . Ovarian cyst   . Patient desires pregnancy 09/18/2013  . Polyneuropathy   . PONV (postoperative nausea and vomiting) seizure post-operatively   seizure following ankle surgery  . Potassium (K) deficiency   . Pregnant 12/25/2013  . Psychiatric pseudoseizure   . RLQ abdominal pain 07/18/2013  . Sciatica of left side 11/14/2014  . 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 Stress 09/18/2013   Outpatient Encounter Medications as of 12/12/2018  Medication Sig  . acetaminophen (TYLENOL) 500 MG tablet Take 1,000 mg by mouth every 6 (six) hours as needed for moderate pain or headache.  . amphetamine-dextroamphetamine (ADDERALL XR) 30 MG  24 hr capsule TAKE (1) CAPSULE BY MOUTH EVERY DAY.  . carbamazepine (TEGRETOL) 200 MG tablet Take two tablets at bedtime (Patient taking differently: Take 400 mg by mouth at bedtime. )  . cariprazine (VRAYLAR) capsule Take 1.5 mg by mouth daily.  . clonazePAM (KLONOPIN) 0.5 MG tablet TAKE (1) TABLET BY MOUTH 3 TIMES DAILY AS NEEDED FOR ANXIETY. (Patient not taking: Reported on 10/26/2018)  . cloNIDine (CATAPRES) 0.1 MG tablet TAKE 1 TABLET BY MOUTH AT BEDTIME.  Mariane Baumgarten Sodium (COLACE PO) Take 1 capsule by mouth daily.  . DULoxetine (CYMBALTA) 60 MG capsule Take 2 capsules (120 mg total) by mouth at bedtime.  Marland Kitchen HYDROcodone Bitartrate ER (HYSINGLA ER) 30 MG T24A Take 30 mg by mouth at bedtime.   . Multiple Vitamins-Iron (MULTIVITAMIN/IRON PO) Take 2 tablets by mouth daily.  . pantoprazole (PROTONIX) 40 MG tablet Take 1 tablet (40 mg total) by mouth daily.  . phenazopyridine (PYRIDIUM) 200 MG tablet Take 200 mg by mouth 3 (three) times daily as needed for pain.  Marland Kitchen PROAIR HFA 108 (90 Base) MCG/ACT inhaler INHALE 2 PUFFS INTO THE LUNGS EVERY 6 HOURS AS NEEDED. (Patient taking differently: Inhale 2 puffs into the lungs every 6 (six) hours as needed for wheezing or shortness of breath. )  . promethazine (PHENERGAN) 25 MG tablet TAKE ONE TABLET BY MOUTH EVERY 6 HOURS AS NEEDED. (Patient taking differently: Take 25 mg by mouth every 6 (six) hours as needed for nausea or vomiting. )  . solifenacin (VESICARE) 5 MG tablet Take 1 tablet (5 mg total) by mouth daily.  Marland Kitchen topiramate (TOPAMAX) 50 MG tablet Take one in the am and 2 at night (Patient taking differently: Take 50-100 mg by mouth See admin instructions. Take 50 mg in the morning and 100 mg at night)  . valACYclovir (VALTREX) 1000 MG tablet Take 1 bid for 5 days then 1 daily for suppression   No facility-administered encounter medications on file as of 12/12/2018.    Allergies  Allergen Reactions  . Gabapentin Other (See Comments)    Pt states that  she was unresponsive after taking this medication, but her vitals remained stable.    . Metoclopramide Hcl Anxiety and Other (See Comments)    Pt states that she felt like she was trapped in a box, and could not get out.  Pt also states that she had temporary loss of movement, weakness, and tingling.    . Tramadol Other (See Comments)    Seizures  . Ativan [Lorazepam] Other (See Comments)    combative  . Latex Itching, Rash and Other (See Comments)    Burns  . Lyrica [Pregabalin] Other (See Comments)  suicidal  . Trazodone And Nefazodone Other (See Comments)    Makes pt "like a zombie"  . Zofran [Ondansetron] Other (See Comments)    Migraines in high doses    . Tapentadol Hcl   . Butrans [Buprenorphine] Rash    Patch caused rash, didn't help with pain  . Nucynta [Tapentadol] Nausea And Vomiting  . Propofol Nausea And Vomiting  . Tape Itching, Rash and Other (See Comments)    Please use paper tape  . Toradol [Ketorolac Tromethamine] Anxiety    Social History   Tobacco Use  . Smoking status: Former Smoker    Packs/day: 0.25    Years: 6.00    Pack years: 1.50    Types: E-cigarettes  . Smokeless tobacco: Never Used  Substance Use Topics  . Alcohol use: Yes    Alcohol/week: 0.0 standard drinks    Comment: occasionally   Functioning Relationships: good support system, strained with spouse or significant others and poor relationship with parents Education: College       Please specify degree:  EMT certification  Other Pertinent History: None Family History  Problem Relation Age of Onset  . Hemochromatosis Maternal Grandmother   . Migraines Maternal Grandmother   . Cancer Maternal Grandmother   . Breast cancer Maternal Grandmother   . Hypertension Father   . Diabetes Father   . Coronary artery disease Father   . Migraines Paternal Grandmother   . Breast cancer Paternal Grandmother   . Cancer Mother        breast  . Hemochromatosis Mother   . Breast cancer Mother   .  Depression Mother   . Anxiety disorder Mother   . Coronary artery disease Paternal Grandfather   . Anxiety disorder Brother   . Bipolar disorder Brother   . Healthy Daughter   . Healthy Son      Review of Systems Constitutional: negative  Objective:  There were no vitals filed for this visit.  Physical Exam: {N  Mental Status Exam: Appearance:  Casually dressed Psychomotor::  Within Normal Limits Attention span and concentration: Normal Behavior: calm and cooperative Speech:  normal pitch Mood:  depressed and anxious Affect:  blunted Thought Process:  Coherent Thought Content:  WDL Orientation:  person, place and time/date Cognition:  grossly intact Insight:  Fair Judgment:  Intact Estimate of Intelligence: Average Fund of knowledge: Aware of current events Memory: Recent and remote intact Abnormal movements: None Gait and station: Normal  Assessment:  Diagnosis: No primary diagnosis found. No diagnosis found.  Indications for admission: inpatient care required if not in partial hospital program  Plan: Orders placed for occupational therapy  patient enrolled in Partial Hospitalization Program, patient's current medications are to be continued, a comprehensive treatment plan will be developed and side effects of medications have been reviewed with patient  - Refilled Cymbalta 60 mg, Vraylar 1.35m po q daily willconsider titriation   Initiated Prozac 10 mg and Vistaril 25 mg PO TID PRN  Discontinued Doxepin 10 mg Po QHS   Labs pending: TSH and 12 drug screening    Treatment options and alternatives reviewed with patient and patient understands the above plan.  Treatment plan was reviewed and agreed by NP T.LBobby Rumpfand patient EQuintasia Therouxneed for group services    Comments:  .    [place] NP

## 2018-12-12 NOTE — Therapy (Signed)
[facility] [place], [place], [address] Phone: [phone]   Fax:  [phone]  Occupational Therapy Treatment  Patient Details  Name: Penny [name] MRN: [id number] Date of Birth: [birth year] Referring Provider (OT): Ricky Ala, NP   Encounter Date: 12/12/2018  OT End of Session - 12/12/18 1248    Visit Number  2    Number of Visits  16    Date for OT Re-Evaluation  01/08/19    Authorization Type  [employer]    OT Start Time  1100    OT Stop Time  1200    OT Time Calculation (min)  60 min    Activity Tolerance  Patient tolerated treatment well    Behavior During Therapy  [facility] for tasks assessed/performed       Past Medical History:  Diagnosis Date  . ADD (attention deficit disorder)   . Anemia 2011   2o to GASTRIC BYPASS  . Anginal pain ([place])   . Anxiety   . Blood transfusion without reported diagnosis   . Chronic daily headache   . Depression   . Depression   . Dysmenorrhea 09/18/2013  . Dysrhythmia   . Elevated liver enzymes JUL 2011ALK PHOS 111-127 AST  143-267 ALT  213-321T BILI 0.6  ALB  3.7-4.06 Jun 2011 ALK PHOS 118 AST 24 ALT 42 T BILI 0.4 ALB 3.9  . Encounter for drug rehabilitation    at behavioral health for opioid addiction about 4 years ago  . Family history of adverse reaction to anesthesia    'dad had to be kept on pump for breathing for morphine'  . Fatty liver   . Fibroid 01/18/2017  . Fibromyalgia   . Gastric bypass status for obesity   . Gastritis JULY 2011  . Heart murmur   . 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 History of Holter monitoring   . Hx of opioid abuse ([place])    for about 4 years, about 4 years ago  . Interstitial cystitis   . Iron deficiency anemia 07/23/2010  . Irritable bowel syndrome 2012 DIARRHEA   JUN 2012 TTG IgA 14.9  . IUD FEB 2010  . Lupus ([place])   . Menorrhagia 07/18/2013  . Migraines   . Obesity (BMI 30-39.9) 2011 228 LBS BMI  36.8  . Ovarian cyst   . Patient desires pregnancy 09/18/2013  . Polyneuropathy   . PONV (postoperative nausea and vomiting) seizure post-operatively   seizure following ankle surgery  . Potassium (K) deficiency   . Pregnant 12/25/2013  . Psychiatric pseudoseizure   . RLQ abdominal pain 07/18/2013  . Sciatica of left side 11/14/2014  . 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 Stress 09/18/2013    Past Surgical History:  Procedure Laterality Date  . BIOPSY  04/10/2015   Procedure: BIOPSY;  Surgeon: Daneil Dolin, MD;  Location: [facility];  Service: Endoscopy;;  . cath self every nite     for sodium bicarb injection (discontinued 2013)  . CHOLECYSTECTOMY  2005   biliary dyskinesia  . COLONOSCOPY  JUN 2012 ABD PN/DIARRHEA WITH PROPOFOL   NL COLON  . DILATION AND CURETTAGE OF UTERUS    . DILITATION & CURRETTAGE/HYSTROSCOPY WITH NOVASURE ABLATION N/A 03/24/2017   Procedure: DILATATION & CURETTAGE/HYSTEROSCOPY WITH NOVASURE ENDOMETRIAL ABLATION;  Surgeon: Jonnie Kind, MD;  Location: [facility];  Service: Gynecology;  Laterality: N/A;  . ESOPHAGEAL DILATION N/A 04/10/2015   Procedure:  ESOPHAGEAL DILATION WITH 54FR MALONEY DILATOR;  Surgeon: Daneil Dolin, MD;  Location: [facility];  Service: Endoscopy;  Laterality: N/A;  . ESOPHAGOGASTRODUODENOSCOPY    . ESOPHAGOGASTRODUODENOSCOPY (EGD) WITH PROPOFOL N/A 04/10/2015   Procedure: ESOPHAGOGASTRODUODENOSCOPY (EGD) WITH PROPOFOL;  Surgeon: Daneil Dolin, MD;  Location: [facility];  Service: Endoscopy;  Laterality: N/A;  . ESOPHAGOGASTRODUODENOSCOPY (EGD) WITH PROPOFOL N/A 12/06/2016   Procedure: ESOPHAGOGASTRODUODENOSCOPY (EGD) WITH PROPOFOL;  Surgeon: Danie Binder, MD;  [facility];  Service: Endoscopy;  Laterality: N/A;  . GAB  2007   in [place]-POUCH 5 CM  . GASTRIC BYPASS  06/2006  . HYSTEROSCOPY W/D&C N/A 09/12/2014   Procedure: DILATATION AND CURETTAGE /HYSTEROSCOPY;  Surgeon: Jonnie Kind, MD;  Location: [facility];  Service:  Gynecology;  Laterality: N/A;  . KYPHOPLASTY N/A 08/02/2018   Procedure: KYPHOPLASTY T12;  Surgeon: Melina Schools, MD;  Location: [facility];  Service: Orthopedics;  Laterality: N/A;  60 mins  . LAPAROSCOPIC TUBAL LIGATION Bilateral 03/24/2017   Procedure: LAPAROSCOPIC TUBAL LIGATION ([place] Rings);  Surgeon: Jonnie Kind, MD;  Location: [facility];  Service: Gynecology;  Laterality: Bilateral;  . REPAIR VAGINAL CUFF N/A 07/30/2014   Procedure: REPAIR VAGINAL CUFF;  Surgeon: Mora Bellman, MD;  Location: [place] [facility];  Service: Gynecology;  Laterality: N/A;  . SAVORY DILATION  06/20/2012   Dr. Barnie Alderman gastritis/Ulcer in the mid jejunum. Empiric dilation.   . SURAL NERVE BX Left 02/25/2016   Procedure: LEFT SURAL NERVE BIOPSY;  Surgeon: Jovita Gamma, MD;  Location: [facility];  Service: Neurosurgery;  Laterality: Left;  Left sural nerve biopsy  . TONSILLECTOMY    . TONSILLECTOMY AND ADENOIDECTOMY    . UPPER GASTROINTESTINAL ENDOSCOPY  JULY 2011 NAUSEA-D125,V6, PH 25   Bx; GASTRITIS, POUCH-5 CM LONG  . WISDOM TOOTH EXTRACTION      There were no vitals filed for this visit.  Subjective Assessment - 12/12/18 1246    Currently in Pain?  No/denies        S: "I really do not have a routine and I only get 4 hours a night"   O: Pt educated on sleep hygiene as it pertains to daily life/routines this date. Education given on appropriate sleep routines, sleep disorders, detriments of too much/too little sleep with encouraged feedback of personal Experiences. Sleep diary handout given to challenge pt to track current habits and identify area for change. Further education given on relaxation techniques to implement before bed. Pt asked to identify one-two STGs in relation to sleep hygiene to create better daily sleep habits.   A: Pt presents to group drowsy/fatigued with flat affect, but engaged and participatory throughout entirety of session. Education received in a positive manner to help improve sleep  hygiene in daily life. Pt agreeable to use sleep diary this date. Currently, pt is sleeping ~4 hours per night and is very unsatisfied and drowsy. During education, she shares she has little bed time routine and sleeps more sporadically. She also shares she has no established bed time routine, especially when her kids are not around. Pt identified goal of creating and maintaining a healthy bed time routine and  to improve sleep hygiene this date.   P: Pt provided with skills to increase sleep hygiene habits into daily routine. OT will continue to follow up with communication skills for successful implementation into daily life.                    OT Education -  12/12/18 1246    Education Details  education given on sleep hygiene strategies     Person(s) Educated  Patient    Methods  Explanation;Handout    Comprehension  Verbalized understanding       OT Short Term Goals - 12/12/18 1249      OT SHORT TERM GOAL #1   Title  Pt will be educated on strategies to improve psychosocial skills needed to participate fully in all daily, work, and leisure activities    Time  4    Period  Weeks    Status  On-going    Target Date  01/08/19      OT SHORT TERM GOAL #2   Title  Pt will apply psychosocial skills and coping mechansisms to daily activities in order to function independently and reintegrate into community dwelling    Time  4    Period  Weeks    Status  On-going    Target Date  01/08/19      OT SHORT TERM GOAL #4   Title  Pt will recall and/or apply sleep hygiene strategies to improve functional engagement in BADL upon reintegrating into community    Time  4    Period  Weeks    Status  On-going    Target Date  01/08/19      OT SHORT TERM GOAL #5   Title  Pt will engage in goal setting to improve functional BADL/IADL routine upon reintegrating into community    Time  4    Period  Weeks    Status  On-going    Target Date  01/08/19               Plan -  12/12/18 1248    Occupational performance deficits (Please refer to evaluation for details):  ADL's;IADL's;Rest and Sleep;Work;Education;Social Participation;Leisure       Patient will benefit from skilled therapeutic intervention in order to improve the following deficits and impairments:  Decreased psychosocial skills, Decreased coping skills, Other (comment)(decreased ability to engage in BADL and reintegrate into community)  Visit Diagnosis: Organic personality disorder  Difficulty coping    Problem List Patient Active Problem List   Diagnosis Date Noted  . Urge incontinence 10/26/2018  . S/P kyphoplasty 08/02/2018  . Herpes genitalis in women 12/19/2017  . Abnormal urine odor 03/30/2017  . Urinary tract infection with hematuria 03/30/2017  . Screening for genitourinary condition 03/30/2017  . Pain 03/30/2017  . Burning with urination 03/30/2017  . Family history of breast cancer in mother,uncertain BR CA status 02/28/2017  . Fibroid 01/18/2017  . Chronic pain syndrome 12/05/2016  . Folate deficiency 12/04/2016  . Nausea vomiting and diarrhea 12/04/2016  . Severe anemia 12/04/2016  . Symptomatic anemia 12/03/2016  . Complaints of leg weakness   . Paresthesia   . Left-sided weakness   . Uncontrolled pain 01/24/2016  . Malnutrition of moderate degree 01/24/2016  . Neuropathy 01/23/2016  . Inability to walk 01/23/2016  . Paresthesias   . Bilateral leg numbness 11/26/2015  . 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 Post partum depression 05/09/2015  . Anastomotic ulcer   . Dysphagia   . Hematemesis 04/07/2015  . Intractable vomiting with nausea 04/07/2015  . Elevated liver enzymes   . Epigastric pain   . Pseudoseizure ([place]) 02/22/2015  . Seizure-like activity ([place]) 02/22/2015  . Fibromyalgia 02/18/2015  . Vulvar fissure 02/18/2015  . Clinical depression 02/01/2015  . Current smoker 01/29/2015  .  Current  tobacco use 01/29/2015  . 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 Leg weakness, bilateral 01/02/2015  . Gait disorder 01/02/2015  . Other symptoms and signs involving the musculoskeletal system 01/02/2015  . Abnormal gait 01/02/2015  . Cervicalgia 12/18/2014  . Sciatica of left side 11/14/2014  . Neuralgia neuritis, sciatic nerve 11/14/2014  . 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 Encounter for sterilization 09/09/2014  . Abdominal pain, acute, right lower quadrant 08/22/2014  . Headache, migraine 08/19/2014  . Seizure ([place]) 08/18/2014  . Encephalopathy 08/13/2014  . Headache 08/13/2014  . Altered mental status 08/13/2014  . Right leg numbness 07/31/2014  . Disturbance of skin sensation 07/31/2014  . Hypertension in pregnancy, transient 07/08/2014  . Previous gastric bypass affecting pregnancy, antepartum 05/22/2014  . 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 ASD (atrial septal defect), ostium secundum 05/17/2014  . Depression complicating pregnancy in second trimester, antepartum 05/09/2014  . Antepartum mental disorder in pregnancy 05/09/2014  . Rapid palpitations 02/18/2014  . H/O maternal third degree perineal laceration, currently pregnant 02/18/2014  . High-risk pregnancy 02/18/2014  . Supervision of pregnancy with other poor reproductive or obstetric history, unspecified trimester 02/18/2014  . Restless legs 01/16/2014  . Restless leg 01/16/2014  . Chronic interstitial cystitis 10/23/2013  . Menorrhagia 07/18/2013  . Excessive and frequent menstruation 07/18/2013  . h/o Opiate addiction 03/11/2012    Class: Acute  . History of migraine headaches 03/10/2012    Class: Acute  . Depression with anxiety 03/10/2012    Class: Chronic  . 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 Panic disorder without  agoraphobia 03/10/2012  . H/O disease 03/10/2012  . Dysthymia 03/10/2012  . Pelvic congestion syndrome 10/13/2011  . Coitalgia 10/13/2011  . Chronic migraine without aura 10/13/2011  . Unspecified dyspareunia 10/13/2011  . IBS (irritable bowel syndrome) 08/25/2011  . Diarrhea 05/27/2011  . OBESITY, UNSPECIFIED 09/17/2010  . Transaminitis 09/17/2010  . Anemia, iron deficiency 07/23/2010   Zenovia Jarred, MSOT, OTR/L Behavioral Health OT/ Acute Relief OT PHP Office: [phone]  Zenovia Jarred 12/12/2018, 12:50 PM  [facility] PARTIAL HOSPITALIZATION PROGRAM [place], [place], [address] Phone: [phone]   Fax:  [phone]  Name: Penny [name] MRN: [id number] Date of Birth: 14-Jan-1986
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
## 2018-12-13 ENCOUNTER — Encounter (HOSPITAL_COMMUNITY): Payer: Self-pay | Admitting: Family

## 2018-12-13 ENCOUNTER — Other Ambulatory Visit: Payer: Self-pay | Admitting: Obstetrics and Gynecology

## 2018-12-13 ENCOUNTER — Other Ambulatory Visit (HOSPITAL_COMMUNITY): Payer: BLUE CROSS/BLUE SHIELD | Admitting: Licensed Clinical Social Worker

## 2018-12-13 DIAGNOSIS — F332 Major depressive disorder, recurrent severe without psychotic features: Secondary | ICD-10-CM | POA: Diagnosis not present

## 2018-12-13 DIAGNOSIS — F411 Generalized anxiety disorder: Secondary | ICD-10-CM

## 2018-12-13 MED ORDER — FLUOXETINE HCL 10 MG PO CAPS: 10.0000 mg | ORAL_CAPSULE | Freq: Every day | ORAL | 2 refills | Status: DC

## 2018-12-13 NOTE — Psych (Signed)
[facility] BH PHP THERAPIST PROGRESS NOTE  Penny [name] [id number]  Session Time: 9:00 - 10:00  Participation Level: Active  Behavioral Response: CasualAlertAnxious  Type of Therapy: Group Therapy  Treatment Goals addressed: Coping  Interventions: CBT, DBT, Solution Focused, Supportive and Reframing  Summary: Clinician led check-in regarding current stressors and situation, and review of patient completed daily inventory. Clinician utilized active listening and empathetic response and validated patient emotions. Clinician facilitated processing group on pertinent issues.   Therapist Response: Penny [name] is a 33 y.o. female who presents with depression and anxiety symptoms.  Patient arrived within time allowed and reports that she is feeling "pretty rough" Patient rates her mood at a 5 on a scale of 1-10 with 10 being great. Pt reports her weekend was "really good" reports she had her children this weekend and they kept her busy. Pt states spending time with her boyfriend and his kids on Saturday and then spending time with her brother's family on Sunday. Pt reports her anxiety is still present, however having her children around helps, because one of her anxieties comes from being away from them. Pt reports continued poor sleep, averaging 3-4 hours a night and rates appetite, energy, and concentration as poor. Pt is upbeat and smiling through most of the session. Patient engaged in discussion.        Session Time: 10:00 - 10:45   Participation Level: Active   Behavioral Response: CasualAlertDepressed   Type of Therapy:  OT   Treatment Goals addressed: Coping   Interventions: Psychosocial skills training, Supportive,    Summary:  Occupational Therapy    Therapist Response: Patient engaged insession. See OT note.          Session Time: 10:45 - 11:30  Participation Level: Active  Behavioral Response: CasualAlertDepressed  Type of Therapy: Individual  therapy  Treatment Goals addressed: Coping  Interventions: CBT, DBT, Solution Focused, Supportive and Reframing  Summary:  Clinician engaged pt in discussion on her goals for treatment and where pt feels she needs improvement.   Therapist Response: Pt reports goal of treatment as getting more sleep, learning how to be happy, increasing boundaries, and working on relationships. Pt reports many relationship stressors and feeling as if others want to "control" her. Pt reports feeling less independent living with mom and step-dad and that she feels they are trying to "treat me like a kid." Pt states limited relationship with dad, who she claims is "best friends" with her ex-husband. Pt states ultimate desire of having her own place. Pt struggles with cognitive distortions throughout session and has little insight into her own accountability.          Session Time: 11:30 - 12:15  Participation Level: Active  Behavioral Response: CasualAlertDepressed  Type of Therapy: Group Therapy  Treatment Goals addressed: Coping  Interventions: Systems analyst, Supportive  Summary:  Reflection Group: Patients encouraged to practice skills and interpersonal techniques or work on mindfulness and relaxation techniques. The importance of self-care and making skills part of a routine to increase usage were stressed   Therapist Response: Patient engaged and participated appropriately.         Session Time: 12:15- 1:15  Participation Level: Active  Behavioral Response: CasualAlertDepressed  Type of Therapy: Group Therapy, Psychoeducation; Psychotherapy  Treatment Goals addressed: Coping  Interventions: CBT; Solution focused; Supportive; Reframing  Summary: 12:00 - 12:50 Clinician introduced topic of needs. Group completed a needs assessment and discussed how to increase meeting basic needs by adding baby  steps to routine. 12:50 -1:00 Clinician led check-out. Clinician assessed for  immediate needs, medication compliance and efficacy, and safety concerns   Therapist Response: Patient engaged activity and discussion. Pt reports biggest need that needs improvement to be building trust.  At check-out, patient rates her mood at a 5 on a scale of 1-10 with 10 being great. Patient reports afternoon plans of going to a doctor's appointment and spending the evening with her boyfriend. Patient demonstrates some progress as evidenced by participating in first group session. Patient denies SI/HI/self-harm at the end of group.      Suicidal/Homicidal: Nowithout intent/plan  Plan: Pt will continue in PHP while working to decrease depression and anxiety symptoms and increase ability to manage symptoms in a healthy manner.    Diagnosis: Severe episode of recurrent major depressive disorder, without psychotic features ([place]) [F33.2]    1. Severe episode of recurrent major depressive disorder, without psychotic features ([place])   2. GAD (generalized anxiety disorder)       Lorin Glass, LCSW 12/13/2018

## 2018-12-14 ENCOUNTER — Other Ambulatory Visit (HOSPITAL_COMMUNITY): Payer: BLUE CROSS/BLUE SHIELD | Admitting: Licensed Clinical Social Worker

## 2018-12-14 ENCOUNTER — Telehealth: Payer: Self-pay | Admitting: Adult Health

## 2018-12-14 ENCOUNTER — Encounter (HOSPITAL_COMMUNITY): Payer: Self-pay | Admitting: Occupational Therapy

## 2018-12-14 ENCOUNTER — Other Ambulatory Visit (HOSPITAL_COMMUNITY): Payer: BLUE CROSS/BLUE SHIELD | Admitting: Occupational Therapy

## 2018-12-14 DIAGNOSIS — F411 Generalized anxiety disorder: Secondary | ICD-10-CM

## 2018-12-14 DIAGNOSIS — R4589 Other symptoms and signs involving emotional state: Secondary | ICD-10-CM

## 2018-12-14 DIAGNOSIS — F332 Major depressive disorder, recurrent severe without psychotic features: Secondary | ICD-10-CM | POA: Diagnosis not present

## 2018-12-14 DIAGNOSIS — F070 Personality change due to known physiological condition: Secondary | ICD-10-CM

## 2018-12-14 MED ORDER — VALACYCLOVIR HCL 1 G PO TABS: ORAL_TABLET | ORAL | 99 refills | Status: DC

## 2018-12-14 NOTE — Psych (Signed)
[facility] PHP THERAPIST PROGRESS NOTE  Penny [name] [id number]  Session Time: 9:00 - 11:00  Participation Level: Active  Behavioral Response: CasualAlertDepressed  Type of Therapy: Group Therapy  Treatment Goals addressed: Coping  Interventions: CBT, DBT, Solution Focused, Supportive and Reframing  Summary: Clinician led check-in regarding current stressors and situation, and review of patient completed daily inventory. Clinician utilized active listening and empathetic response and validated patient emotions. Clinician facilitated processing group on pertinent issues.   Therapist Response: Penny [name] is a 33 y.o. female who presents with depression and anxiety symptoms.  Patient arrived within time allowed and reports that she is feeling "not great" Patient rates her mood at a 5 on a scale of 1-10 with 10 being great. Pt is noticeably altered in presentation compared to yesterday; making poor eye contact, lower volume of speech, depressed mood. Pt reports she spent her afternoon shopping and then spent the night with her boyfriend and cooked dinner. Pt states continued poor sleep, getting 4 hours, and states she continues to be "very" tired, and this is one of her main complaints. Patient engaged in discussion.  After meeting with provider, pt was irritable and tearful. Pt states that because she did not get prescribed the medication she wanted that treatment is ineffective and "no one cares." Cln attempted to reframe pt with some success when discussing what is and is not within our control and the right to seek second opinions. Pt is overall negative, however towards end of this discussion seems brighter and is not crying. Pt requests to speak with provider again "because I wasn't all honest with her and I think I need to be." Provider will see pt again tomorrow.         Session Time: 11:00 -12:00   Participation Level: Active   Behavioral Response: CasualAlertDepressed    Type of Therapy: Group Therapy, OT   Treatment Goals addressed: Coping   Interventions: Psychosocial skills training, Supportive,    Summary:  Occupational Therapy group   Therapist Response: Patient engaged in group. See OT note.          Session Time: 12:00 - 12:45  Participation Level: Active  Behavioral Response: CasualAlertDepressed  Type of Therapy: Group Therapy, Activity Therapy  Treatment Goals addressed: Coping  Interventions: Systems analyst, Supportive  Summary:  Reflection Group: Patients encouraged to practice skills and interpersonal techniques or work on mindfulness and relaxation techniques. The importance of self-care and making skills part of a routine to increase usage were stressed   Therapist Response: Patient engaged and participated appropriately.         Session Time: 12:45- 2:00  Participation Level: Active  Behavioral Response: CasualAlertDepressed  Type of Therapy: Group Therapy, Psychoeducation; Psychotherapy  Treatment Goals addressed: Coping  Interventions: CBT; Solution focused; Supportive; Reframing  Summary: 12:45 - 1:50: Clinician introduced topic of cognitive distortions. Cln educated on what cognitive distortions are and how they affect [name]. Cln introduced "Catch, Challenge, Change" and group began review of cognitive distortion handout and came up with examples to work on "catch." 1:50 -2:00 Clinician led check-out. Clinician assessed for immediate needs, medication compliance and efficacy, and safety concerns   Therapist Response: Patient minimally engaged in activity and discussion. Pt was able to identify some examples of cognitive distortions discussed, however continues to have low insight. At [place], patient rates her mood at a 8 on a scale of 1-10 with 10 being great. Patient reports afternoon plans of resting and spending time  with her boyfriend.  Patient demonstrates some progress as evidenced by being  open to reframing and managing negative thoughts. Patient denies SI/HI/self-harm at the end of group.      Suicidal/Homicidal: Nowithout intent/plan  Plan: Pt will continue in PHP while working to decrease depression and anxiety symptoms and increase ability to manage symptoms in a healthy manner.    Diagnosis: Major depressive disorder, recurrent episode, 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Penny [name]) [F33.2]    1. Major depressive disorder, recurrent episode, 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Penny [name])   2. GAD (generalized anxiety disorder)       Penny Glass, LCSW 12/14/2018

## 2018-12-14 NOTE — Therapy (Signed)
[place], [place], [address] Phone: [phone]   Fax:  [phone]  Occupational Therapy Treatment  Patient Details  Name: Penny [name] MRN: [id number] Date of Birth: 1986/02/09 Referring Provider (OT): Ricky Ala, NP   Encounter Date: 12/14/2018  OT End of Session - 12/14/18 1347    Visit Number  3    Number of Visits  16    Date for OT Re-Evaluation  01/08/19    Authorization Type  [employer]    OT Start Time  1100    OT Stop Time  1200    OT Time Calculation (min)  60 min    Activity Tolerance  Patient tolerated treatment well    Behavior During Therapy  [facility] for tasks assessed/performed       Past Medical History:  Diagnosis Date  . ADD (attention deficit disorder)   . Anemia 2011   2o to GASTRIC BYPASS  . Anginal pain ([place])   . Anxiety   . Blood transfusion without reported diagnosis   . Chronic daily headache   . Depression   . Depression   . Dysmenorrhea 09/18/2013  . Dysrhythmia   . Elevated liver enzymes JUL 2011ALK PHOS 111-127 AST  143-267 ALT  213-321T BILI 0.6  ALB  3.7-4.06 Jun 2011 ALK PHOS 118 AST 24 ALT 42 T BILI 0.4 ALB 3.9  . Encounter for drug rehabilitation    at behavioral health for opioid addiction about 4 years ago  . Family history of adverse reaction to anesthesia    'dad had to be kept on pump for breathing for morphine'  . Fatty liver   . Fibroid 01/18/2017  . Fibromyalgia   . Gastric bypass status for obesity   . Gastritis JULY 2011  . Heart murmur   . 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 History of Holter monitoring   . Hx of opioid abuse ([place])    for about 4 years, about 4 years ago  . Interstitial cystitis   . Iron deficiency anemia 07/23/2010  . Irritable bowel syndrome 2012 DIARRHEA   JUN 2012 TTG IgA 14.9  . IUD FEB 2010  . Lupus ([place])   . Menorrhagia 07/18/2013  . Migraines   . Obesity (BMI 30-39.9) 2011 228 LBS BMI  36.8  . Ovarian cyst   . Patient desires pregnancy 09/18/2013  . Polyneuropathy   . PONV (postoperative nausea and vomiting) seizure post-operatively   seizure following ankle surgery  . Potassium (K) deficiency   . Pregnant 12/25/2013  . Psychiatric pseudoseizure   . RLQ abdominal pain 07/18/2013  . Sciatica of left side 11/14/2014  . 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 Stress 09/18/2013    Past Surgical History:  Procedure Laterality Date  . BIOPSY  04/10/2015   Procedure: BIOPSY;  Surgeon: Daneil Dolin, MD;  Location: [facility];  Service: Endoscopy;;  . cath self every nite     for sodium bicarb injection (discontinued 2013)  . CHOLECYSTECTOMY  2005   biliary dyskinesia  . COLONOSCOPY  JUN 2012 ABD PN/DIARRHEA WITH PROPOFOL   NL COLON  . DILATION AND CURETTAGE OF UTERUS    . DILITATION & CURRETTAGE/HYSTROSCOPY WITH NOVASURE ABLATION N/A 03/24/2017   Procedure: DILATATION & CURETTAGE/HYSTEROSCOPY WITH NOVASURE ENDOMETRIAL ABLATION;  Surgeon: Jonnie Kind, MD;  Location: [facility];  Service: Gynecology;  Laterality: N/A;  . ESOPHAGEAL DILATION N/A 04/10/2015   Procedure:  ESOPHAGEAL DILATION WITH 54FR MALONEY DILATOR;  Surgeon: Daneil Dolin, MD;  Location: [facility];  Service: Endoscopy;  Laterality: N/A;  . ESOPHAGOGASTRODUODENOSCOPY    . ESOPHAGOGASTRODUODENOSCOPY (EGD) WITH PROPOFOL N/A 04/10/2015   Procedure: ESOPHAGOGASTRODUODENOSCOPY (EGD) WITH PROPOFOL;  Surgeon: Daneil Dolin, MD;  Location: [facility];  Service: Endoscopy;  Laterality: N/A;  . ESOPHAGOGASTRODUODENOSCOPY (EGD) WITH PROPOFOL N/A 12/06/2016   Procedure: ESOPHAGOGASTRODUODENOSCOPY (EGD) WITH PROPOFOL;  Surgeon: Danie Binder, MD;  Location[facility];  Service: Endoscopy;  Laterality: N/A;  . GAB  2007   in [place]-POUCH 5 CM  . GASTRIC BYPASS  06/2006  . HYSTEROSCOPY W/D&C N/A 09/12/2014   Procedure: DILATATION AND CURETTAGE /HYSTEROSCOPY;  Surgeon: Jonnie Kind, MD;  Location: [facility];  Service:  Gynecology;  Laterality: N/A;  . KYPHOPLASTY N/A 08/02/2018   Procedure: KYPHOPLASTY T12;  Surgeon: Melina Schools, MD;  Location: [place];  Service: Orthopedics;  Laterality: N/A;  60 mins  . LAPAROSCOPIC TUBAL LIGATION Bilateral 03/24/2017   Procedure: LAPAROSCOPIC TUBAL LIGATION ([place] Rings);  Surgeon: Jonnie Kind, MD;  Location: [facility];  Service: Gynecology;  Laterality: Bilateral;  . REPAIR VAGINAL CUFF N/A 07/30/2014   Procedure: REPAIR VAGINAL CUFF;  Surgeon: Mora Bellman, MD;  Location: [place] [facility];  Service: Gynecology;  Laterality: N/A;  . SAVORY DILATION  06/20/2012   Dr. Barnie Alderman gastritis/Ulcer in the mid jejunum. Empiric dilation.   . SURAL NERVE BX Left 02/25/2016   Procedure: LEFT SURAL NERVE BIOPSY;  Surgeon: Jovita Gamma, MD;  Location: [place] [facility];  Service: Neurosurgery;  Laterality: Left;  Left sural nerve biopsy  . TONSILLECTOMY    . TONSILLECTOMY AND ADENOIDECTOMY    . UPPER GASTROINTESTINAL ENDOSCOPY  JULY 2011 NAUSEA-D125,V6, PH 25   Bx; GASTRITIS, POUCH-5 CM LONG  . WISDOM TOOTH EXTRACTION      There were no vitals filed for this visit.  Subjective Assessment - 12/14/18 1347    Currently in Pain?  Other (Comment)   chronic back pain      S: "I am struggling to do this"  O: Education given on goal setting to help begin to formulate productive engagement in BADL routines. Pt given goal identifying worksheet to list immediate, short term, medium term, and long-term goals using a SMART goal framework (specificity, meaningful, adaptive, realistic, and time bound). Goals created as guideline for pt to practice being accountable in various situations. Pt asked to share goals with group. Art activity then implemented for pt to create vision board of goals. Various mediums of art offered to meet pt preferences and needs. Projects shared at end of session.  A: Pt presents to group with flat affect initially, upset about yesterday's encounter with the provider and not  getting the medication she had originally desired. Pt presents to group today reporting "seizure like activity". Per chart review pt with history of pseudo seizures. Pulled pt aside and advised her to seek emergency medical care if symptoms are severe, or offered pt to be dismissed to home from group. Pt states "that can't do anything for me there, if I have a seizure I have a seizure. It is more safe for me to stay here than try to drive". During this conversation, pt very guarded and talking soft, shaking limbs and reporting numbness. After conversation, observed pt in group as bubbly and interacting with other members appropriately. Pt completed goal setting worksheet, identifying goals in the areas of personal development and familial relationships. Pt reporting difficulty initiating  art activity, OT offering encouragement. Pt completed art activity with much success, showing increased affect. Pt showing carryover from previous session, stating she purchased some new sleep hygiene items and has been being consistent with her bedtime routine.  P: OT will continue to follow up on goals mentioned ot ensure successful implementation when pt building productive BADL routines.                    OT Education - 12/14/18 1347    Education Details  education given on goal setting    Person(s) Educated  Patient    Methods  Explanation;Handout    Comprehension  Verbalized understanding       OT Short Term Goals - 12/12/18 1249      OT SHORT TERM GOAL #1   Title  Pt will be educated on strategies to improve psychosocial skills needed to participate fully in all daily, work, and leisure activities    Time  4    Period  Weeks    Status  On-going    Target Date  01/08/19      OT SHORT TERM GOAL #2   Title  Pt will apply psychosocial skills and coping mechansisms to daily activities in order to function independently and reintegrate into community dwelling    Time  4    Period  Weeks     Status  On-going    Target Date  01/08/19      OT SHORT TERM GOAL #4   Title  Pt will recall and/or apply sleep hygiene strategies to improve functional engagement in BADL upon reintegrating into community    Time  4    Period  Weeks    Status  On-going    Target Date  01/08/19      OT SHORT TERM GOAL #5   Title  Pt will engage in goal setting to improve functional BADL/IADL routine upon reintegrating into community    Time  4    Period  Weeks    Status  On-going    Target Date  01/08/19               Plan - 12/14/18 1348    Occupational performance deficits (Please refer to evaluation for details):  ADL's;IADL's;Rest and Sleep;Work;Education;Social Participation;Leisure       Patient will benefit from skilled therapeutic intervention in order to improve the following deficits and impairments:  Decreased psychosocial skills, Decreased coping skills, Other (comment)(decreased ability to engage in BADL and reintegrate into community)  Visit Diagnosis: Organic personality disorder  Difficulty coping    Problem List Patient Active Problem List   Diagnosis Date Noted  . Urge incontinence 10/26/2018  . S/P kyphoplasty 08/02/2018  . Herpes genitalis in women 12/19/2017  . Abnormal urine odor 03/30/2017  . Urinary tract infection with hematuria 03/30/2017  . Screening for genitourinary condition 03/30/2017  . Pain 03/30/2017  . Burning with urination 03/30/2017  . Family history of breast cancer in mother,uncertain BR CA status 02/28/2017  . Fibroid 01/18/2017  . Chronic pain syndrome 12/05/2016  . Folate deficiency 12/04/2016  . Nausea vomiting and diarrhea 12/04/2016  . Severe anemia 12/04/2016  . Symptomatic anemia 12/03/2016  . Complaints of leg weakness   . Paresthesia   . Left-sided weakness   . Uncontrolled pain 01/24/2016  . Malnutrition of moderate degree 01/24/2016  . Neuropathy 01/23/2016  . Inability to walk 01/23/2016  . Paresthesias   .  Bilateral leg numbness 11/26/2015  .  Major depressive disorder, recurrent episode, 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[place]) 05/10/2015  . Post partum depression 05/09/2015  . Anastomotic ulcer   . Dysphagia   . Hematemesis 04/07/2015  . Intractable vomiting with nausea 04/07/2015  . Elevated liver enzymes   . Epigastric pain   . 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 Seizure-like activity ([place]) 02/22/2015  . Fibromyalgia 02/18/2015  . Vulvar fissure 02/18/2015  . Clinical depression 02/01/2015  . Current smoker 01/29/2015  . Current tobacco use 01/29/2015  . 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 Leg weakness, bilateral 01/02/2015  . Gait disorder 01/02/2015  . Other symptoms and signs involving the musculoskeletal system 01/02/2015  . Abnormal gait 01/02/2015  . Cervicalgia 12/18/2014  . Sciatica of left side 11/14/2014  . Neuralgia neuritis, sciatic nerve 11/14/2014  . 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 Encounter for sterilization 09/09/2014  . Abdominal pain, acute, right lower quadrant 08/22/2014  . Headache, migraine 08/19/2014  . Seizure ([place]) 08/18/2014  . Encephalopathy 08/13/2014  . Headache 08/13/2014  . Altered mental status 08/13/2014  . Right leg numbness 07/31/2014  . Disturbance of skin sensation 07/31/2014  . Hypertension in pregnancy, transient 07/08/2014  . Previous gastric bypass affecting pregnancy, antepartum 05/22/2014  . 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 ASD (atrial septal defect), ostium secundum 05/17/2014  . Depression complicating pregnancy in second trimester, antepartum 05/09/2014  . Antepartum mental disorder in pregnancy 05/09/2014  . Rapid palpitations 02/18/2014  . H/O maternal third degree perineal laceration, currently pregnant 02/18/2014  . High-risk pregnancy 02/18/2014  . Supervision of pregnancy with other poor reproductive or obstetric history, unspecified trimester 02/18/2014  . Restless legs  01/16/2014  . Restless leg 01/16/2014  . Chronic interstitial cystitis 10/23/2013  . Menorrhagia 07/18/2013  . Excessive and frequent menstruation 07/18/2013  . h/o Opiate addiction 03/11/2012    Class: Acute  . History of migraine headaches 03/10/2012    Class: Acute  . Depression with anxiety 03/10/2012    Class: Chronic  . 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 Panic disorder without agoraphobia 03/10/2012  . H/O disease 03/10/2012  . Dysthymia 03/10/2012  . Pelvic congestion syndrome 10/13/2011  . Coitalgia 10/13/2011  . Chronic migraine without aura 10/13/2011  . Unspecified dyspareunia 10/13/2011  . IBS (irritable bowel syndrome) 08/25/2011  . Diarrhea 05/27/2011  . OBESITY, UNSPECIFIED 09/17/2010  . Transaminitis 09/17/2010  . Anemia, iron deficiency 07/23/2010   Zenovia Jarred, MSOT, OTR/L Behavioral Health OT/ Acute Relief OT PHP Office: [phone]  Zenovia Jarred 12/14/2018, 1:48 PM  [facility] HOSPITALIZATION PROGRAM [place], [place], [address] Phone: [phone]   Fax:  [phone]  Name: THURSA EMME MRN: [id number] Date of Birth: 07-20-1986

## 2018-12-14 NOTE — Telephone Encounter (Signed)
Patient called stating that she would like for [facility] to call her in a refill of her Valtrex to [facility]. Pt states that she asked her pharmacy to send it to Dr. Glo Herring but I informed Pt that Dr. Glo Herring is not in the office and wont be back in until 1/20. Pt states that she can not wait that long. Please have Anderson Malta contact pt, she would like to speak with her

## 2018-12-14 NOTE — Telephone Encounter (Signed)
Refilled valtrex  

## 2018-12-15 ENCOUNTER — Other Ambulatory Visit (HOSPITAL_COMMUNITY): Payer: BLUE CROSS/BLUE SHIELD | Admitting: Licensed Clinical Social Worker

## 2018-12-15 ENCOUNTER — Other Ambulatory Visit (HOSPITAL_COMMUNITY): Payer: BLUE CROSS/BLUE SHIELD

## 2018-12-15 DIAGNOSIS — F411 Generalized anxiety disorder: Secondary | ICD-10-CM

## 2018-12-15 DIAGNOSIS — F332 Major depressive disorder, recurrent severe without psychotic features: Secondary | ICD-10-CM | POA: Diagnosis not present

## 2018-12-15 LAB — SPECIMEN STATUS REPORT

## 2018-12-15 LAB — TSH: TSH: 1.78 u[IU]/mL (ref 0.450–4.500)

## 2018-12-16 LAB — SPECIMEN STATUS REPORT

## 2018-12-16 LAB — DRUG SCREEN 10 W/CONF, WB
AMPHETAMINES, IA: NEGATIVE ng/mL
Barbiturates, IA: NEGATIVE ug/mL
Benzodiazepines, IA: NEGATIVE ng/mL
Cocaine/Metabolite, IA: NEGATIVE ng/mL
METHADONE, IA: NEGATIVE ng/mL
OPIATES, IA: NEGATIVE ng/mL
OXYCODONES, IA: NEGATIVE ng/mL
PHENCYCLIDINE, IA: NEGATIVE ng/mL
PROPOXYPHENE, IA: NEGATIVE ng/mL
THC (Marijuana) Mtb, IA: NEGATIVE ng/mL

## 2018-12-18 ENCOUNTER — Other Ambulatory Visit (HOSPITAL_COMMUNITY): Payer: BLUE CROSS/BLUE SHIELD | Admitting: Licensed Clinical Social Worker

## 2018-12-18 ENCOUNTER — Encounter (HOSPITAL_COMMUNITY): Payer: Self-pay

## 2018-12-18 ENCOUNTER — Other Ambulatory Visit (HOSPITAL_COMMUNITY): Payer: BLUE CROSS/BLUE SHIELD | Admitting: Occupational Therapy

## 2018-12-18 DIAGNOSIS — F332 Major depressive disorder, recurrent severe without psychotic features: Secondary | ICD-10-CM | POA: Diagnosis not present

## 2018-12-18 DIAGNOSIS — R4589 Other symptoms and signs involving emotional state: Secondary | ICD-10-CM

## 2018-12-18 DIAGNOSIS — F070 Personality change due to known physiological condition: Secondary | ICD-10-CM

## 2018-12-18 DIAGNOSIS — F411 Generalized anxiety disorder: Secondary | ICD-10-CM

## 2018-12-18 NOTE — Therapy (Signed)
[place], [place], [address] Phone: [phone]   Fax:  [phone]  Occupational Therapy Treatment  Patient Details  Name: Penny [name] MRN: [id number] Date of Birth: [birth year] Referring Provider (OT): Ricky Ala, NP   Encounter Date: 12/18/2018  OT End of Session - 12/18/18 1324    Visit Number  4    Number of Visits  16    Date for OT Re-Evaluation  01/08/19    Authorization Type  [employer]    OT Start Time  1100    OT Stop Time  1200    OT Time Calculation (min)  60 min    Activity Tolerance  Patient tolerated treatment well    Behavior During Therapy  [facility] for tasks assessed/performed       Past Medical History:  Diagnosis Date  . ADD (attention deficit disorder)   . Anemia 2011   2o to GASTRIC BYPASS  . Anginal pain ([place])   . Anxiety   . Blood transfusion without reported diagnosis   . Chronic daily headache   . Depression   . Depression   . Dysmenorrhea 09/18/2013  . Dysrhythmia   . Elevated liver enzymes JUL 2011ALK PHOS 111-127 AST  143-267 ALT  213-321T BILI 0.6  ALB  3.7-4.06 Jun 2011 ALK PHOS 118 AST 24 ALT 42 T BILI 0.4 ALB 3.9  . Encounter for drug rehabilitation    at behavioral health for opioid addiction about 4 years ago  . Family history of adverse reaction to anesthesia    'dad had to be kept on pump for breathing for morphine'  . Fatty liver   . Fibroid 01/18/2017  . Fibromyalgia   . Gastric bypass status for obesity   . Gastritis JULY 2011  . Heart murmur   . 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 History of Holter monitoring   . Hx of opioid abuse ([place])    for about 4 years, about 4 years ago  . Interstitial cystitis   . Iron deficiency anemia 07/23/2010  . Irritable bowel syndrome 2012 DIARRHEA   JUN 2012 TTG IgA 14.9  . IUD FEB 2010  . Lupus ([place])   . Menorrhagia 07/18/2013  . Migraines   . Obesity (BMI 30-39.9) 2011 228 LBS  BMI 36.8  . Ovarian cyst   . Patient desires pregnancy 09/18/2013  . Polyneuropathy   . PONV (postoperative nausea and vomiting) seizure post-operatively   seizure following ankle surgery  . Potassium (K) deficiency   . Pregnant 12/25/2013  . Psychiatric pseudoseizure   . RLQ abdominal pain 07/18/2013  . Sciatica of left side 11/14/2014  . 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 Stress 09/18/2013    Past Surgical History:  Procedure Laterality Date  . BIOPSY  04/10/2015   Procedure: BIOPSY;  Surgeon: Daneil Dolin, MD;  Location: [facility];  Service: Endoscopy;;  . cath self every nite     for sodium bicarb injection (discontinued 2013)  . CHOLECYSTECTOMY  2005   biliary dyskinesia  . COLONOSCOPY  JUN 2012 ABD PN/DIARRHEA WITH PROPOFOL   NL COLON  . DILATION AND CURETTAGE OF UTERUS    . DILITATION & CURRETTAGE/HYSTROSCOPY WITH NOVASURE ABLATION N/A 03/24/2017   Procedure: DILATATION & CURETTAGE/HYSTEROSCOPY WITH NOVASURE ENDOMETRIAL ABLATION;  Surgeon: Jonnie Kind, MD;  Location: [facility];  Service: Gynecology;  Laterality: N/A;  . ESOPHAGEAL DILATION N/A 04/10/2015   Procedure:  ESOPHAGEAL DILATION WITH 54FR MALONEY DILATOR;  Surgeon: Daneil Dolin, MD;  Location: [facility];  Service: Endoscopy;  Laterality: N/A;  . ESOPHAGOGASTRODUODENOSCOPY    . ESOPHAGOGASTRODUODENOSCOPY (EGD) WITH PROPOFOL N/A 04/10/2015   Procedure: ESOPHAGOGASTRODUODENOSCOPY (EGD) WITH PROPOFOL;  Surgeon: Daneil Dolin, MD;  Location: [facility];  Service: Endoscopy;  Laterality: N/A;  . ESOPHAGOGASTRODUODENOSCOPY (EGD) WITH PROPOFOL N/A 12/06/2016   Procedure: ESOPHAGOGASTRODUODENOSCOPY (EGD) WITH PROPOFOL;  Surgeon: Danie Binder, MD;  [facility];  Service: Endoscopy;  Laterality: N/A;  . GAB  2007   in [place]-POUCH 5 CM  . GASTRIC BYPASS  06/2006  . HYSTEROSCOPY W/D&C N/A 09/12/2014   Procedure: DILATATION AND CURETTAGE /HYSTEROSCOPY;  Surgeon: Jonnie Kind, MD;  Location: [facility];  Service:  Gynecology;  Laterality: N/A;  . KYPHOPLASTY N/A 08/02/2018   Procedure: KYPHOPLASTY T12;  Surgeon: Melina Schools, MD;  Location: [facility];  Service: Orthopedics;  Laterality: N/A;  60 mins  . LAPAROSCOPIC TUBAL LIGATION Bilateral 03/24/2017   Procedure: LAPAROSCOPIC TUBAL LIGATION ([place] Rings);  Surgeon: Jonnie Kind, MD;  Location: [facility];  Service: Gynecology;  Laterality: Bilateral;  . REPAIR VAGINAL CUFF N/A 07/30/2014   Procedure: REPAIR VAGINAL CUFF;  Surgeon: Mora Bellman, MD;  Location: [facility];  Service: Gynecology;  Laterality: N/A;  . SAVORY DILATION  06/20/2012   Dr. Barnie Alderman gastritis/Ulcer in the mid jejunum. Empiric dilation.   . SURAL NERVE BX Left 02/25/2016   Procedure: LEFT SURAL NERVE BIOPSY;  Surgeon: Jovita Gamma, MD;  Location: [facility];  Service: Neurosurgery;  Laterality: Left;  Left sural nerve biopsy  . TONSILLECTOMY    . TONSILLECTOMY AND ADENOIDECTOMY    . UPPER GASTROINTESTINAL ENDOSCOPY  JULY 2011 NAUSEA-D125,V6, PH 25   Bx; GASTRITIS, POUCH-5 CM LONG  . WISDOM TOOTH EXTRACTION      There were no vitals filed for this visit.  Subjective Assessment - 12/18/18 1323    Currently in Pain?  Other (Comment)   chronic pain   Pain Location  Back       S: "Sometimes I will listen to music or go for a drive to manage my stress"   O: Stress management group completed to use as productive coping strategy, to help mitigate maladaptive coping to integrate in functional BADL/IADL. Education given on the definition of stress and its cognitive, behavioral, emotional, and physical effects on the body. Stress management tool worksheet discussed to educate on unhealthy vs healthy coping skills to manage stress to improve community integration. Coping strategies taught include: relaxation based- deep breathing, counting to 10, taking a 1 minute vacation, acceptance, stress balls, relaxation audio/video, visual/mental imagery. Positive mental attitude- gratitude,  acceptance, cognitive reframing, positive self talk, anger management. Gratitude journaling prompts given and discussed as a group.  A: Pt presents to group with blunted affect, engaged and participatory with facilitator and other group members this date. Stress management tools worksheet completed, with pt identifying that she suppresses emotions and will "explode" at uncontrolled times. She also shares that she has experienced passivity after her divorce, she felt as if she was not able to handle situations independently. She shares her main stressor right now is that she cannot return to her previous career, and the financial strain that comes along with being unemployed and going through [employer]. Pt has committed to cognitive reframing in situations by using mantras and writing in a journal. She also mentioned previous success with mantras, and wants to return to this.  P: Pt provided with education on stress management activities. OT will continue to follow up with activities learned for successful implementation into daily life                     OT Education - 12/18/18 1324    Education Details  education given on stress management    Person(s) Educated  Patient    Methods  Explanation;Handout    Comprehension  Verbalized understanding       OT Short Term Goals - 12/12/18 1249      OT SHORT TERM GOAL #1   Title  Pt will be educated on strategies to improve psychosocial skills needed to participate fully in all daily, work, and leisure activities    Time  4    Period  Weeks    Status  On-going    Target Date  01/08/19      OT [place] #2   Title  Pt will apply psychosocial skills and coping mechansisms to daily activities in order to function independently and reintegrate into community dwelling    Time  4    Period  Weeks    Status  On-going    Target Date  01/08/19      OT SHORT TERM GOAL #4   Title  Pt will recall and/or apply sleep  hygiene strategies to improve functional engagement in BADL upon reintegrating into community    Time  4    Period  Weeks    Status  On-going    Target Date  01/08/19      OT SHORT TERM GOAL #5   Title  Pt will engage in goal setting to improve functional BADL/IADL routine upon reintegrating into community    Time  4    Period  Weeks    Status  On-going    Target Date  01/08/19               Plan - 12/18/18 1324    Occupational performance deficits (Please refer to evaluation for details):  ADL's;IADL's;Rest and Sleep;Work;Education;Social Participation;Leisure       Patient will benefit from skilled therapeutic intervention in order to improve the following deficits and impairments:  Decreased psychosocial skills, Decreased coping skills, Other (comment)(decreased ability to engage in BADL And reintegrate into community)  Visit Diagnosis: Organic personality disorder  Difficulty coping    Problem List Patient Active Problem List   Diagnosis Date Noted  . Urge incontinence 10/26/2018  . S/P kyphoplasty 08/02/2018  . Herpes genitalis in women 12/19/2017  . Abnormal urine odor 03/30/2017  . Urinary tract infection with hematuria 03/30/2017  . Screening for genitourinary condition 03/30/2017  . Pain 03/30/2017  . Burning with urination 03/30/2017  . Family history of breast cancer in mother,uncertain BR CA status 02/28/2017  . Fibroid 01/18/2017  . Chronic pain syndrome 12/05/2016  . Folate deficiency 12/04/2016  . Nausea vomiting and diarrhea 12/04/2016  . Severe anemia 12/04/2016  . Symptomatic anemia 12/03/2016  . Complaints of leg weakness   . Paresthesia   . Left-sided weakness   . Uncontrolled pain 01/24/2016  . Malnutrition of moderate degree 01/24/2016  . Neuropathy 01/23/2016  . Inability to walk 01/23/2016  . Paresthesias   . Bilateral leg numbness 11/26/2015  . 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 Post partum depression 05/09/2015  . Anastomotic ulcer   . Dysphagia   . Hematemesis 04/07/2015  . Intractable  vomiting with nausea 04/07/2015  . Elevated liver enzymes   . Epigastric pain   . 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 Seizure-like activity ([place]) 02/22/2015  . Fibromyalgia 02/18/2015  . Vulvar fissure 02/18/2015  . Clinical depression 02/01/2015  . Current smoker 01/29/2015  . Current tobacco use 01/29/2015  . 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 Leg weakness, bilateral 01/02/2015  . Gait disorder 01/02/2015  . Other symptoms and signs involving the musculoskeletal system 01/02/2015  . Abnormal gait 01/02/2015  . Cervicalgia 12/18/2014  . Sciatica of left side 11/14/2014  . Neuralgia neuritis, sciatic nerve 11/14/2014  . 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 Encounter for sterilization 09/09/2014  . Abdominal pain, acute, right lower quadrant 08/22/2014  . Headache, migraine 08/19/2014  . Seizure ([place]) 08/18/2014  . Encephalopathy 08/13/2014  . Headache 08/13/2014  . Altered mental status 08/13/2014  . Right leg numbness 07/31/2014  . Disturbance of skin sensation 07/31/2014  . Hypertension in pregnancy, transient 07/08/2014  . Previous gastric bypass affecting pregnancy, antepartum 05/22/2014  . 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 ASD (atrial septal defect), ostium secundum 05/17/2014  . Depression complicating pregnancy in second trimester, antepartum 05/09/2014  . Antepartum mental disorder in pregnancy 05/09/2014  . Rapid palpitations 02/18/2014  . H/O maternal third degree perineal laceration, currently pregnant 02/18/2014  . High-risk pregnancy 02/18/2014  . Supervision of pregnancy with other poor reproductive or obstetric history, unspecified trimester 02/18/2014  . Restless legs 01/16/2014  . Restless leg 01/16/2014  . Chronic interstitial cystitis 10/23/2013  . Menorrhagia 07/18/2013  . Excessive  and frequent menstruation 07/18/2013  . h/o Opiate addiction 03/11/2012    Class: Acute  . History of migraine headaches 03/10/2012    Class: Acute  . Depression with anxiety 03/10/2012    Class: Chronic  . 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 Panic disorder without agoraphobia 03/10/2012  . H/O disease 03/10/2012  . Dysthymia 03/10/2012  . Pelvic congestion syndrome 10/13/2011  . Coitalgia 10/13/2011  . Chronic migraine without aura 10/13/2011  . Unspecified dyspareunia 10/13/2011  . IBS (irritable bowel syndrome) 08/25/2011  . Diarrhea 05/27/2011  . OBESITY, UNSPECIFIED 09/17/2010  . Transaminitis 09/17/2010  . Anemia, iron deficiency 07/23/2010   Zenovia Jarred, MSOT, OTR/L Behavioral Health OT/ Acute Relief OT PHP Office: [phone]  Zenovia Jarred 12/18/2018, 1:26 PM  [facility] HOSPITALIZATION PROGRAM [place], [place], [address] Phone: [phone]   Fax:  [phone]  Name: Penny [name] MRN: [id number] Date of Birth: Aug 17, 1986

## 2018-12-19 ENCOUNTER — Other Ambulatory Visit (HOSPITAL_COMMUNITY): Payer: BLUE CROSS/BLUE SHIELD | Admitting: Licensed Clinical Social Worker

## 2018-12-19 ENCOUNTER — Other Ambulatory Visit (HOSPITAL_COMMUNITY): Payer: BLUE CROSS/BLUE SHIELD | Admitting: Occupational Therapy

## 2018-12-19 ENCOUNTER — Encounter (HOSPITAL_COMMUNITY): Payer: Self-pay | Admitting: Occupational Therapy

## 2018-12-19 DIAGNOSIS — F332 Major depressive disorder, recurrent severe without psychotic features: Secondary | ICD-10-CM

## 2018-12-19 DIAGNOSIS — F411 Generalized anxiety disorder: Secondary | ICD-10-CM

## 2018-12-19 DIAGNOSIS — R4589 Other symptoms and signs involving emotional state: Secondary | ICD-10-CM

## 2018-12-19 DIAGNOSIS — F070 Personality change due to known physiological condition: Secondary | ICD-10-CM

## 2018-12-19 NOTE — Therapy (Addendum)
Penny [name], [place], [address] Phone: [phone]   Fax:  [phone]  Occupational Therapy Treatment  Patient Details  Name: Penny [name] MRN: [id number] Date of Birth: 1986/10/23 Referring Provider (OT): Ricky Ala, NP   Encounter Date: 12/19/2018  OT End of Session - 12/26/18 0920    Visit Number  5    Number of Visits  16    Date for OT Re-Evaluation  01/08/19    Authorization Type  [employer]    OT Start Time  1100    OT Stop Time  1200    OT Time Calculation (min)  60 min    Activity Tolerance  Patient tolerated treatment well    Behavior During Therapy  [facility] for tasks assessed/performed       Past Medical History:  Diagnosis Date  . ADD (attention deficit disorder)   . Anemia 2011   2o to GASTRIC BYPASS  . Anginal pain ([place])   . Anxiety   . Blood transfusion without reported diagnosis   . Chronic daily headache   . Depression   . Depression   . Dysmenorrhea 09/18/2013  . Dysrhythmia   . Elevated liver enzymes JUL 2011ALK PHOS 111-127 AST  143-267 ALT  213-321T BILI 0.6  ALB  3.7-4.06 Jun 2011 ALK PHOS 118 AST 24 ALT 42 T BILI 0.4 ALB 3.9  . Encounter for drug rehabilitation    at behavioral health for opioid addiction about 4 years ago  . Family history of adverse reaction to anesthesia    'dad had to be kept on pump for breathing for morphine'  . Fatty liver   . Fibroid 01/18/2017  . Fibromyalgia   . Gastric bypass status for obesity   . Gastritis JULY 2011  . Heart murmur   . 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 History of Holter monitoring   . Hx of opioid abuse ([place])    for about 4 years, about 4 years ago  . Interstitial cystitis   . Iron deficiency anemia 07/23/2010  . Irritable bowel syndrome 2012 DIARRHEA   JUN 2012 TTG IgA 14.9  . IUD FEB 2010  . Lupus ([place])   . Menorrhagia 07/18/2013  . Migraines   . Obesity (BMI 30-39.9) 2011 228 LBS  BMI 36.8  . Ovarian cyst   . Patient desires pregnancy 09/18/2013  . Polyneuropathy   . PONV (postoperative nausea and vomiting) seizure post-operatively   seizure following ankle surgery  . Potassium (K) deficiency   . Pregnant 12/25/2013  . Psychiatric pseudoseizure   . RLQ abdominal pain 07/18/2013  . Sciatica of left side 11/14/2014  . 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 Stress 09/18/2013    Past Surgical History:  Procedure Laterality Date  . BIOPSY  04/10/2015   Procedure: BIOPSY;  Surgeon: Daneil Dolin, MD;  Location: [facility];  Service: Endoscopy;;  . cath self every nite     for sodium bicarb injection (discontinued 2013)  . CHOLECYSTECTOMY  2005   biliary dyskinesia  . COLONOSCOPY  JUN 2012 ABD PN/DIARRHEA WITH PROPOFOL   NL COLON  . DILATION AND CURETTAGE OF UTERUS    . DILITATION & CURRETTAGE/HYSTROSCOPY WITH NOVASURE ABLATION N/A 03/24/2017   Procedure: DILATATION & CURETTAGE/HYSTEROSCOPY WITH NOVASURE ENDOMETRIAL ABLATION;  Surgeon: Jonnie Kind, MD;  Location: [facility];  Service: Gynecology;  Laterality: N/A;  . ESOPHAGEAL DILATION N/A 04/10/2015   Procedure:  ESOPHAGEAL DILATION WITH 54FR MALONEY DILATOR;  Surgeon: Daneil Dolin, MD;  Location: [facility];  Service: Endoscopy;  Laterality: N/A;  . ESOPHAGOGASTRODUODENOSCOPY    . ESOPHAGOGASTRODUODENOSCOPY (EGD) WITH PROPOFOL N/A 04/10/2015   Procedure: ESOPHAGOGASTRODUODENOSCOPY (EGD) WITH PROPOFOL;  Surgeon: Daneil Dolin, MD;  Location: [facility];  Service: Endoscopy;  Laterality: N/A;  . ESOPHAGOGASTRODUODENOSCOPY (EGD) WITH PROPOFOL N/A 12/06/2016   Procedure: ESOPHAGOGASTRODUODENOSCOPY (EGD) WITH PROPOFOL;  Surgeon: Danie Binder, MD;  [facility];  Service: Endoscopy;  Laterality: N/A;  . GAB  2007   in [place]-POUCH 5 CM  . GASTRIC BYPASS  06/2006  . HYSTEROSCOPY W/D&C N/A 09/12/2014   Procedure: DILATATION AND CURETTAGE /HYSTEROSCOPY;  Surgeon: Jonnie Kind, MD;  Location: [facility];  Service:  Gynecology;  Laterality: N/A;  . KYPHOPLASTY N/A 08/02/2018   Procedure: KYPHOPLASTY T12;  Surgeon: Melina Schools, MD;  Location: [place];  Service: Orthopedics;  Laterality: N/A;  60 mins  . LAPAROSCOPIC TUBAL LIGATION Bilateral 03/24/2017   Procedure: LAPAROSCOPIC TUBAL LIGATION ([place] Rings);  Surgeon: Jonnie Kind, MD;  Location: [facility];  Service: Gynecology;  Laterality: Bilateral;  . REPAIR VAGINAL CUFF N/A 07/30/2014   Procedure: REPAIR VAGINAL CUFF;  Surgeon: Mora Bellman, MD;  Location: [place] [facility];  Service: Gynecology;  Laterality: N/A;  . SAVORY DILATION  06/20/2012   Dr. Barnie Alderman gastritis/Ulcer in the mid jejunum. Empiric dilation.   . SURAL NERVE BX Left 02/25/2016   Procedure: LEFT SURAL NERVE BIOPSY;  Surgeon: Jovita Gamma, MD;  Location: [place] [facility];  Service: Neurosurgery;  Laterality: Left;  Left sural nerve biopsy  . TONSILLECTOMY    . TONSILLECTOMY AND ADENOIDECTOMY    . UPPER GASTROINTESTINAL ENDOSCOPY  JULY 2011 NAUSEA-D125,V6, PH 25   Bx; GASTRITIS, POUCH-5 CM LONG  . WISDOM TOOTH EXTRACTION      There were no vitals filed for this visit.     S: "Making lists overwhelms me more"   O: Education received on time management to help increase occupational balance and quality of life. Education given on strategy to prioritize a "to do list" effectively, with practice during group. Pt given to do list and asked to share top priorities and lower priorities. Activities wheel activity administered with pt to identify hours devoted to: work/obligations, leisure/relaxation, self-care/caregiving, and sleep/rest. Further education given to allow pt to better balance activity wheel for increased occupational balance. Weekly planner handouts given at end of session with education on different modalities of organization (planner,?apps, computer, to do lists, etc.) so pt could choose preferred avenue. Further education given on the importance of time management in community  reintegration from [facility] and how to continue using skills. Additional information given on medication management, from the aspect of lifestyle/organizational skills. Pt given medication chart and various apps to use to help increase compliance of medication while implementing it into BADL routine.   A:?Pt presents to group with blunted affect, engaged and participatory throughout activity. Pt completed to do list activity, sharing that making a list made her feel more overwhelmed. Encouraged pt to break list down in more manageable parts, pt still somewhat resistant. Pt then completed activities wheel, stating that she has 0 hours for lieusre/relaxation. When encouraged to think of some leisure activities, pt resistant stating that she feels her schedule is working fine and is insistent she cannot have leisure and small children. Pt shares she has a locked "medication bag" and keeps all of her and children's medications in the bag. When  asked if this was an organized process, pt said no. When challenged to use pill organizer or organizational sheet, pt still somewhat resistant. She states that she can remember all of what she needs to do in her head.   P: Pt provided with education on time management skills to increase occupational balance and quality of life. OT will continue to follow up with pt for successful implementation into daily life.?                      OT Short Term Goals - 12/26/18 0919      OT SHORT TERM GOAL #1   Title  Pt will be educated on strategies to improve psychosocial skills needed to participate fully in all daily, work, and leisure activities    Time  4    Period  Weeks    Status  Achieved    Target Date  01/08/19      OT SHORT TERM GOAL #2   Title  Pt will apply psychosocial skills and coping mechansisms to daily activities in order to function independently and reintegrate into community dwelling    Time  4    Period  Weeks    Status  Achieved     Target Date  01/08/19      OT SHORT TERM GOAL #4   Title  Pt will recall and/or apply sleep hygiene strategies to improve functional engagement in BADL upon reintegrating into community    Time  4    Period  Weeks    Status  Achieved    Target Date  01/08/19      OT SHORT TERM GOAL #5   Title  Pt will engage in goal setting to improve functional BADL/IADL routine upon reintegrating into community    Time  4    Period  Weeks    Status  Partially Met    Target Date  01/08/19                 Patient will benefit from skilled therapeutic intervention in order to improve the following deficits and impairments:  Decreased psychosocial skills, Decreased coping skills, Other (comment)(decreased ability to engage in BADL and reintegrate into community)  Visit Diagnosis: Organic personality disorder  Difficulty coping    Problem List Patient Active Problem List   Diagnosis Date Noted  . Urge incontinence 10/26/2018  . S/P kyphoplasty 08/02/2018  . Herpes genitalis in women 12/19/2017  . Abnormal urine odor 03/30/2017  . Urinary tract infection with hematuria 03/30/2017  . Screening for genitourinary condition 03/30/2017  . Pain 03/30/2017  . Burning with urination 03/30/2017  . Family history of breast cancer in mother,uncertain BR CA status 02/28/2017  . Fibroid 01/18/2017  . Chronic pain syndrome 12/05/2016  . Folate deficiency 12/04/2016  . Nausea vomiting and diarrhea 12/04/2016  . Severe anemia 12/04/2016  . Symptomatic anemia 12/03/2016  . Complaints of leg weakness   . Paresthesia   . Left-sided weakness   . Uncontrolled pain 01/24/2016  . Malnutrition of moderate degree 01/24/2016  . Neuropathy 01/23/2016  . Inability to walk 01/23/2016  . Paresthesias   . Bilateral leg numbness 11/26/2015  . 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 Post partum depression 05/09/2015  . Anastomotic ulcer   .  Dysphagia   . Hematemesis 04/07/2015  . Intractable vomiting with nausea 04/07/2015  . Elevated liver enzymes   . Epigastric pain   .  Pseudoseizure ([place]) 02/22/2015  . Seizure-like activity ([place]) 02/22/2015  . Fibromyalgia 02/18/2015  . Vulvar fissure 02/18/2015  . Clinical depression 02/01/2015  . Current smoker 01/29/2015  . Current tobacco use 01/29/2015  . 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 Leg weakness, bilateral 01/02/2015  . Gait disorder 01/02/2015  . Other symptoms and signs involving the musculoskeletal system 01/02/2015  . Abnormal gait 01/02/2015  . Cervicalgia 12/18/2014  . Sciatica of left side 11/14/2014  . Neuralgia neuritis, sciatic nerve 11/14/2014  . 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 Encounter for sterilization 09/09/2014  . Abdominal pain, acute, right lower quadrant 08/22/2014  . Headache, migraine 08/19/2014  . Seizure ([place]) 08/18/2014  . Encephalopathy 08/13/2014  . Headache 08/13/2014  . Altered mental status 08/13/2014  . Right leg numbness 07/31/2014  . Disturbance of skin sensation 07/31/2014  . Hypertension in pregnancy, transient 07/08/2014  . Previous gastric bypass affecting pregnancy, antepartum 05/22/2014  . 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 ASD (atrial septal defect), ostium secundum 05/17/2014  . Depression complicating pregnancy in second trimester, antepartum 05/09/2014  . Antepartum mental disorder in pregnancy 05/09/2014  . Rapid palpitations 02/18/2014  . H/O maternal third degree perineal laceration, currently pregnant 02/18/2014  . High-risk pregnancy 02/18/2014  . Supervision of pregnancy with other poor reproductive or obstetric history, unspecified trimester 02/18/2014  . Restless legs 01/16/2014  . Restless leg 01/16/2014  . Chronic interstitial cystitis 10/23/2013  . Menorrhagia 07/18/2013  . Excessive and frequent menstruation 07/18/2013  . h/o Opiate addiction 03/11/2012     Class: Acute  . History of migraine headaches 03/10/2012    Class: Acute  . Depression with anxiety 03/10/2012    Class: Chronic  . 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 Panic disorder without agoraphobia 03/10/2012  . H/O disease 03/10/2012  . Dysthymia 03/10/2012  . Pelvic congestion syndrome 10/13/2011  . Coitalgia 10/13/2011  . Chronic migraine without aura 10/13/2011  . Unspecified dyspareunia 10/13/2011  . IBS (irritable bowel syndrome) 08/25/2011  . Diarrhea 05/27/2011  . OBESITY, UNSPECIFIED 09/17/2010  . Transaminitis 09/17/2010  . Anemia, iron deficiency 07/23/2010    OCCUPATIONAL THERAPY DISCHARGE SUMMARY  Visits from Start of Care: 5  Current functional level related to goals / functional outcomes: Pt recommended to step down to IOP level of care, began to no show for appointments and did not return.   Remaining deficits: Continuing to implement coping skills learned when reintegrating into environment.   Education / Equipment: Education given on psychosocial and coping mechanisms as it applies to BADL/IADL routine to improve functional re-engagement in community.    Plan: Patient agrees to discharge.  Patient goals were partially met. Patient is being discharged due to not returning since the last visit.  ?????        Zenovia Jarred, MSOT, OTR/L Behavioral Health OT/ Acute Relief OT PHP Office: [place] 12/26/2018, 9:20 AM  [facility] HOSPITALIZATION PROGRAM [address] [place], [place], [address] Phone: [phone]   Fax:  [phone]  Name: DONNETTA GILLIN MRN: [id number] Date of Birth: [birth year]

## 2018-12-20 ENCOUNTER — Encounter (HOSPITAL_COMMUNITY): Payer: Self-pay | Admitting: Family

## 2018-12-20 ENCOUNTER — Encounter (HOSPITAL_COMMUNITY): Payer: Self-pay | Admitting: Psychiatry

## 2018-12-20 ENCOUNTER — Ambulatory Visit (INDEPENDENT_AMBULATORY_CARE_PROVIDER_SITE_OTHER): Payer: BLUE CROSS/BLUE SHIELD | Admitting: Psychiatry

## 2018-12-20 ENCOUNTER — Other Ambulatory Visit (HOSPITAL_COMMUNITY): Payer: BLUE CROSS/BLUE SHIELD | Admitting: Licensed Clinical Social Worker

## 2018-12-20 ENCOUNTER — Telehealth (HOSPITAL_COMMUNITY): Payer: Self-pay | Admitting: Licensed Clinical Social Worker

## 2018-12-20 VITALS — BP 108/74 | HR 119 | Ht 65.0 in | Wt 169.8 lb

## 2018-12-20 DIAGNOSIS — F332 Major depressive disorder, recurrent severe without psychotic features: Secondary | ICD-10-CM

## 2018-12-20 DIAGNOSIS — F411 Generalized anxiety disorder: Secondary | ICD-10-CM

## 2018-12-20 DIAGNOSIS — F1110 Opioid abuse, uncomplicated: Secondary | ICD-10-CM

## 2018-12-20 MED ORDER — DULOXETINE HCL 60 MG PO CPEP: 60.0000 mg | ORAL_CAPSULE | Freq: Every day | ORAL | 2 refills | Status: DC

## 2018-12-20 MED ORDER — CARIPRAZINE HCL 1.5 MG PO CAPS: 1.5000 mg | ORAL_CAPSULE | Freq: Every day | ORAL | 2 refills | Status: DC

## 2018-12-20 MED ORDER — CARBAMAZEPINE 200 MG PO TABS: 400.0000 mg | ORAL_TABLET | Freq: Every day | ORAL | 2 refills | Status: DC

## 2018-12-20 MED ORDER — CLONIDINE HCL 0.1 MG PO TABS: 0.1000 mg | ORAL_TABLET | Freq: Every day | ORAL | 0 refills | Status: DC

## 2018-12-20 MED ORDER — QUETIAPINE FUMARATE 25 MG PO TABS: 25.0000 mg | ORAL_TABLET | Freq: Two times a day (BID) | ORAL | 2 refills | Status: DC | PRN
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
## 2018-12-20 NOTE — Progress Notes (Signed)
BH MD/PA/NP OP Progress Note  12/20/2018 3:04 PM Penny [name]  MRN:  [id number]  Chief Complaint:  Chief Complaint    Depression; Anxiety; Follow-up     HPI: This patient is a 32-year-old divorced white female who lives with HER-2 children in [place].  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  The patient comes in today as a work in.  She has not been seen since last June.  She is actually in the CD IOP program at [facility] in [place].  She saw the nurse practitioner today and apparently did not like what she was told.  The patient states that after injuring her back at work last summer she had to have a laminectomy for compression fracture.  She got back on opiates again and had a difficult time getting off of them.  She went to a Suboxone clinic here in [place] and while there the doctor had her on Suboxone but took her off all controlled drugs including Adderall and clonazepam.  The patient has had a difficult time since then.  She went to the [facility] outpatient program in [place] to get in a day treatment program and she was referred to the CD IOP.  She saw the nurse practitioner there today who refused to get her back on Adderall or Klonopin but suggested she switch to Vraylar for Seroquel to help with anxiety and agitation.  The patient did not like this response and claims she was in a crisis and needed to be seen by me.  I have explained to her that while she is in their program she is under their care.  I also explained that while she is in a substance abuse program that we will not prescribe any controlled drugs and I am reluctant to prescribe them at all because of her recent history and her past history of substance abuse.  She states that she is having difficulty sleeping so I reinstated the clonidine that she had been on in the past. Visit Diagnosis:    ICD-10-CM   1. Severe episode of recurrent major  depressive disorder, without psychotic features (HCC) F33.2     Past Psychiatric History: Previous psychiatric hospitalization several years ago  Past Medical History:  Past Medical History:  Diagnosis Date  . ADD (attention deficit disorder)   . Anemia 2011   2o to GASTRIC BYPASS  . Anginal pain (HCC)   . Anxiety   . Blood transfusion without reported diagnosis   . Chronic daily headache   . Depression   . Depression   . Dysmenorrhea 09/18/2013  . Dysrhythmia   . Elevated liver enzymes JUL 2011ALK PHOS 111-127 AST  143-267 ALT  213-321T BILI 0.6  ALB  3.7-4.06 Jun 2011 ALK PHOS 118 AST 24 ALT 42 T BILI 0.4 ALB 3.9  . Encounter for drug rehabilitation    at behavioral health for opioid addiction about 4 years ago  . Family history of adverse reaction to anesthesia    'dad had to be kept on pump for breathing for morphine'  . Fatty liver   . Fibroid 01/18/2017  . Fibromyalgia   . Gastric bypass status for obesity   . Gastritis JULY 2011  . Heart murmur   . 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 History of Holter monitoring   . Hx of opioid abuse (HCC)    for about 4   years, about 4 years ago  . Interstitial cystitis   . Iron deficiency anemia 07/23/2010  . Irritable bowel syndrome 2012 DIARRHEA   JUN 2012 TTG IgA 14.9  . IUD FEB 2010  . Lupus ([place])   . Menorrhagia 07/18/2013  . Migraines   . Obesity (BMI 30-39.9) 2011 228 LBS BMI 36.8  . Ovarian cyst   . Patient desires pregnancy 09/18/2013  . Polyneuropathy   . PONV (postoperative nausea and vomiting) seizure post-operatively   seizure following ankle surgery  . Potassium (K) deficiency   . Pregnant 12/25/2013  . Psychiatric pseudoseizure   . RLQ abdominal pain 07/18/2013  . Sciatica of left side 11/14/2014  . 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 Stress 09/18/2013    Past Surgical History:  Procedure Laterality Date  . BIOPSY  04/10/2015   Procedure: BIOPSY;  Surgeon: Daneil Dolin,  MD;  Location: [facility];  Service: Endoscopy;;  . cath self every nite     for sodium bicarb injection (discontinued 2013)  . CHOLECYSTECTOMY  2005   biliary dyskinesia  . COLONOSCOPY  JUN 2012 ABD PN/DIARRHEA WITH PROPOFOL   NL COLON  . DILATION AND CURETTAGE OF UTERUS    . DILITATION & CURRETTAGE/HYSTROSCOPY WITH NOVASURE ABLATION N/A 03/24/2017   Procedure: DILATATION & CURETTAGE/HYSTEROSCOPY WITH NOVASURE ENDOMETRIAL ABLATION;  Surgeon: Jonnie Kind, MD;  Location: [facility];  Service: Gynecology;  Laterality: N/A;  . ESOPHAGEAL DILATION N/A 04/10/2015   Procedure: ESOPHAGEAL DILATION WITH 54FR MALONEY DILATOR;  Surgeon: Daneil Dolin, MD;  Location: [facility];  Service: Endoscopy;  Laterality: N/A;  . ESOPHAGOGASTRODUODENOSCOPY    . ESOPHAGOGASTRODUODENOSCOPY (EGD) WITH PROPOFOL N/A 04/10/2015   Procedure: ESOPHAGOGASTRODUODENOSCOPY (EGD) WITH PROPOFOL;  Surgeon: Daneil Dolin, MD;  Location: [facility];  Service: Endoscopy;  Laterality: N/A;  . ESOPHAGOGASTRODUODENOSCOPY (EGD) WITH PROPOFOL N/A 12/06/2016   Procedure: ESOPHAGOGASTRODUODENOSCOPY (EGD) WITH PROPOFOL;  Surgeon: Danie Binder, MD;  Location[facility];  Service: Endoscopy;  Laterality: N/A;  . GAB  2007   in [place]-POUCH 5 CM  . GASTRIC BYPASS  06/2006  . HYSTEROSCOPY W/D&C N/A 09/12/2014   Procedure: DILATATION AND CURETTAGE /HYSTEROSCOPY;  Surgeon: Jonnie Kind, MD;  Location: [facility];  Service: Gynecology;  Laterality: N/A;  . KYPHOPLASTY N/A 08/02/2018   Procedure: KYPHOPLASTY T12;  Surgeon: Melina Schools, MD;  Location: [facility];  Service: Orthopedics;  Laterality: N/A;  60 mins  . LAPAROSCOPIC TUBAL LIGATION Bilateral 03/24/2017   Procedure: LAPAROSCOPIC TUBAL LIGATION ([place] Rings);  Surgeon: Jonnie Kind, MD;  Location: [facility];  Service: Gynecology;  Laterality: Bilateral;  . REPAIR VAGINAL CUFF N/A 07/30/2014   Procedure: REPAIR VAGINAL CUFF;  Surgeon: Mora Bellman, MD;  Location: [facility];  Service: Gynecology;   Laterality: N/A;  . SAVORY DILATION  06/20/2012   Dr. Barnie Alderman gastritis/Ulcer in the mid jejunum. Empiric dilation.   . SURAL NERVE BX Left 02/25/2016   Procedure: LEFT SURAL NERVE BIOPSY;  Surgeon: Jovita Gamma, MD;  Location: [facility];  Service: Neurosurgery;  Laterality: Left;  Left sural nerve biopsy  . TONSILLECTOMY    . TONSILLECTOMY AND ADENOIDECTOMY    . UPPER GASTROINTESTINAL ENDOSCOPY  JULY 2011 NAUSEA-D125,V6, PH 25   Bx; GASTRITIS, POUCH-5 CM LONG  . WISDOM TOOTH EXTRACTION      Family Psychiatric History: See below  Family History:  Family History  Problem Relation Age of Onset  . Hemochromatosis Maternal Grandmother   . Migraines Maternal Grandmother   .  Cancer Maternal Grandmother   . Breast cancer Maternal Grandmother   . Hypertension Father   . Diabetes Father   . Coronary artery disease Father   . Migraines Paternal Grandmother   . Breast cancer Paternal Grandmother   . Cancer Mother        breast  . Hemochromatosis Mother   . Breast cancer Mother   . Depression Mother   . Anxiety disorder Mother   . Coronary artery disease Paternal Grandfather   . Anxiety disorder Brother   . Bipolar disorder Brother   . Healthy Daughter   . Healthy Son     Social History:  Social History   Socioeconomic History  . Marital status: Divorced    Spouse name: Not on file  . Number of children: 2  . Years of education: some colle  . Highest education level: Not on file  Occupational History    Employer: NOT EMPLOYED  Social Needs  . Financial resource strain: Very hard  . Food insecurity:    Worry: Never true    Inability: Never true  . Transportation needs:    Medical: No    Non-medical: No  Tobacco Use  . Smoking status: Former Smoker    Packs/day: 0.25    Years: 6.00    Pack years: 1.50    Types: E-cigarettes    Start date: 05/2018  . Smokeless tobacco: Never Used  Substance and Sexual Activity  . Alcohol use: Yes    Alcohol/week: 0.0  standard drinks    Comment: 1-2 beers a couple times a month   . Drug use: Not Currently    Types: Marijuana    Comment: Previously opioid addiction went through rehab  . Sexual activity: Yes    Partners: Male    Birth control/protection: Surgical, Condom    Comment: tubal and ablation  Lifestyle  . Physical activity:    Days per week: 0 days    Minutes per session: 0 min  . Stress: Very much  Relationships  . Social connections:    Talks on phone: More than three times a week    Gets together: More than three times a week    Attends religious service: More than 4 times per year    Active member of club or organization: No    Attends meetings of clubs or organizations: Never    Relationship status: Divorced  Other Topics Concern  . Not on file  Social History Narrative   Patient is right handed.   Patient drinks one cup of coffee daily.   She lives in a one-story home with her two children (6 year old son, 16 month old daughter).   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 Gabapentin Other (See Comments)    Pt states that she was unresponsive after taking this medication, but her vitals remained stable.    . Metoclopramide Hcl Anxiety and Other (See Comments)    Pt states that she felt like she was trapped in a box, and could not get out.  Pt also states that she had temporary loss of movement, weakness, and tingling.    . Tramadol Other (See Comments)    Seizures  . Ativan [Lorazepam] Other (See Comments)    combative  . Latex Itching, Rash and Other (See Comments)    Burns  . Lyrica [Pregabalin] Other (See Comments)      suicidal  . Trazodone And Nefazodone Other (See Comments)    Makes pt "like a zombie"  . Zofran [Ondansetron] Other (See Comments)    Migraines in high doses    . Tapentadol Hcl   . Butrans [Buprenorphine] Rash    Patch caused rash, didn't help with pain   . Nucynta [Tapentadol] Nausea And Vomiting  . Propofol Nausea And Vomiting  . Tape Itching, Rash and Other (See Comments)    Please use paper tape  . Toradol [Ketorolac Tromethamine] Anxiety    Metabolic Disorder Labs: Lab Results  Component Value Date   HGBA1C 5.7 (H) 01/15/2015   Lab Results  Component Value Date   PROLACTIN 80.0 (H) 05/09/2015   Lab Results  Component Value Date   CHOL 95 03/10/2012   TRIG 50 03/10/2012   HDL 38 (L) 03/10/2012   CHOLHDL 2.5 03/10/2012   VLDL 10 03/10/2012   LDLCALC 47 03/10/2012   Lab Results  Component Value Date   TSH 1.780 12/14/2018   TSH 0.977 11/26/2015    Therapeutic Level Labs: No results found for: LITHIUM No results found for: VALPROATE No components found for:  CBMZ  Current Medications: Current Outpatient Medications  Medication Sig Dispense Refill  . amphetamine-dextroamphetamine (ADDERALL XR) 30 MG 24 hr capsule TAKE (1) CAPSULE BY MOUTH EVERY DAY. 30 capsule 0  . carbamazepine (TEGRETOL) 200 MG tablet Take 2 tablets (400 mg total) by mouth at bedtime. 60 tablet 2  . cariprazine (VRAYLAR) capsule Take 1 capsule (1.5 mg total) by mouth daily. 60 capsule 2  . cloNIDine (CATAPRES) 0.1 MG tablet Take 1 tablet (0.1 mg total) by mouth at bedtime. 30 tablet 0  . DULoxetine (CYMBALTA) 60 MG capsule Take 1 capsule (60 mg total) by mouth at bedtime. 60 capsule 2  . HYDROcodone Bitartrate ER (HYSINGLA ER) 30 MG T24A Take 30 mg by mouth at bedtime.     . Multiple Vitamins-Iron (MULTIVITAMIN/IRON PO) Take 2 tablets by mouth daily.    . pantoprazole (PROTONIX) 40 MG tablet Take 1 tablet (40 mg total) by mouth daily. 90 tablet 3  . phenazopyridine (PYRIDIUM) 200 MG tablet Take 200 mg by mouth 3 (three) times daily as needed for pain.    Marland Kitchen PROAIR HFA 108 (90 Base) MCG/ACT inhaler INHALE 2 PUFFS INTO THE LUNGS EVERY 6 HOURS AS NEEDED. (Patient taking differently: Inhale 2 puffs into the lungs every 6 (six) hours as needed for  wheezing or shortness of breath. ) 8.5 g 0  . promethazine (PHENERGAN) 25 MG tablet TAKE ONE TABLET BY MOUTH EVERY 6 HOURS AS NEEDED. (Patient taking differently: Take 25 mg by mouth every 6 (six) hours as needed for nausea or vomiting. ) 30 tablet 0  . solifenacin (VESICARE) 5 MG tablet Take 1 tablet (5 mg total) by mouth daily. 30 tablet 5  . topiramate (TOPAMAX) 50 MG tablet Take one in the am and 2 at night (Patient taking differently: Take 50-100 mg by mouth See admin instructions. Take 50 mg in the morning and 100 mg at night) 90 tablet 2  . valACYclovir (VALTREX) 1000 MG tablet Take  1 daily for suppression 90 tablet prn  . QUEtiapine (SEROQUEL) 25 MG tablet Take 1 tablet (25 mg total) by mouth 2 (two) times daily as needed. Take 2 tablets ( 50 mg total) by mouth at night 60 tablet 2   No current facility-administered medications for this visit.      Musculoskeletal: Strength & Muscle Tone: within normal  limits Gait & Station: normal Patient leans: N/A  Psychiatric Specialty Exam: Review of Systems  Psychiatric/Behavioral: The patient is nervous/anxious and has insomnia.   All other systems reviewed and are negative.   Blood pressure 108/74, pulse (!) 119, height 5' 5" (1.651 m), weight 169 lb 12.8 oz (77 kg), SpO2 99 %.Body mass index is 28.26 kg/m.  General Appearance: Casual and Fairly Groomed  Eye Contact:  Good  Speech:  Clear and Coherent  Volume:  Normal  Mood:  Anxious and Irritable  Affect:  Constricted and Flat  Thought Process:  Goal Directed  Orientation:  Full (Time, Place, and Person)  Thought Content: Rumination   Suicidal Thoughts:  No  Homicidal Thoughts:  No  Memory:  Immediate;   Good Recent;   Good Remote;   Good  Judgement:  Poor  Insight:  Shallow  Psychomotor Activity:  Normal  Concentration:  Concentration: Fair and Attention Span: Fair  Recall:  Good  Fund of Knowledge: Good  Language: Good  Akathisia:  No  Handed:  Right  AIMS (if  indicated): not done  Assets:  Communication Skills Desire for Improvement Resilience Talents/Skills  ADL's:  Intact  Cognition: WNL  Sleep:  Poor   Screenings: AUDIT     [facility]  Alcohol Use Disorder Identification Test Final Score (AUDIT)  0    GAD-7     Counselor from 12/11/2018 in [facility] Counselor from 02/07/2017 in [facility]  Total GAD-7 Score  19  17    PHQ2-9     Counselor from 12/11/2018 in [facility] Counselor from 07/22/2017 in [facility] Counselor from 02/07/2017 [facility]- Office Visit from 01/10/2017 in [facility] Counselor from 10/06/2016 in [facility]-  PHQ-2 Total Score  6  3  2  3  4  PHQ-9 Total Score  22  15  14  10  18       Assessment and Plan: This patient is a 32-year-old female with a long history of substance abuse.  She is currently in a CD IOP program.  I told her we cannot prescribe controlled drugs while she is in this program.  She can try switching Vraylar to Seroquel as suggested by the nurse practitioner in the program.  She can continue Cymbalta 60 mg daily for depression, Tegretol 400 mg at bedtime for mood stabilization and clonidine 0.1 mg can be added at bedtime for sleep.  I told her that while she is in the program she is to follow directions of the medical provider there and we can see her again in 4 weeks.    , MD 12/20/2018, 3:04 PM 

## 2018-12-21 ENCOUNTER — Other Ambulatory Visit (HOSPITAL_COMMUNITY): Payer: Self-pay

## 2018-12-21 NOTE — Psych (Signed)
   [facility] BH PHP THERAPIST PROGRESS NOTE  Penny [name] [id number]  Session Time: 9:00 - 11:00  Participation Level: Active  Behavioral Response: CasualAlertDepressed  Type of Therapy: Group Therapy  Treatment Goals addressed: Coping  Interventions: CBT, DBT, Solution Focused, Supportive and Reframing  Summary: Clinician led check-in regarding current stressors and situation, and review of patient completed daily inventory. Clinician utilized active listening and empathetic response and validated patient emotions. Clinician facilitated processing group on pertinent issues.   Therapist Response: [name] is a 33 y.o. female who presents with depression and anxiety symptoms.  Patient arrived within time allowed and reports that she is feeling "blah." Patient rates her mood at a 3.5 on a scale of 1-10 with 10 being great. Pt states she spent time yesterday thinking about what was discussed in group and talking it over with her partner which she said was helpful. Pt shares she went and got a diffuser and lavender oil to aid sleep hygiene and began use last night.  Pt reports struggling with trust. Patient engaged in discussion.        Session Time: 11:00 -12:15  Participation Level: Active  Behavioral Response: CasualAlertDepressed  Type of Therapy: Group Therapy, psychotherapy  Treatment Goals addressed: Coping  Interventions: Strengths based, reframing, Supportive,   Summary:  Spiritual Care group  Therapist Response: Patient engaged in group. See chaplain note.         Session Time: 12:15 - 1:00  Participation Level: Active  Behavioral Response: CasualAlertDepressed  Type of Therapy: Group Therapy  Treatment Goals addressed: Coping  Interventions: Systems analyst, Supportive  Summary:  Reflection Group: Patients encouraged to practice skills and interpersonal techniques or work on mindfulness and relaxation techniques. The importance of self-care  and making skills part of a routine to increase usage were stressed   Therapist Response: Patient engaged and participated appropriately.         Session Time: 1:00- 2:00  Participation Level: Active  Behavioral Response: CasualAlertDepressed  Type of Therapy: Group Therapy, Psychoeducation  Treatment Goals addressed: Coping  Interventions: relaxation training; Supportive; Reframing  Summary: 12:45 - 1:50: Relaxation group: Cln led group focused on retraining the body's response to stress.   1:50 -2:00 Clinician led check-out. Clinician assessed for immediate needs, medication compliance and efficacy, and safety concerns   Therapist Response: Patient engaged in activity and discussion. At [place], patient rates her mood at a 2 on a scale of 1-10 with 10 being great. Patient reports afternoon plans of picking up her children, spending time with them, and doing night time routine. Patient demonstrates some progress as evidenced by engaging with group topic at home. Patient denies SI/HI/self-harm thoughts at the end of group.      Suicidal/Homicidal: Nowithout intent/plan  Plan: Pt will continue in PHP while working to decrease depression and anxiety symptoms and increase ability to manage symptoms in a healthy manner.    Diagnosis: Severe episode of recurrent major depressive disorder, without psychotic features ([place]) [F33.2]    1. Severe episode of recurrent major depressive disorder, without psychotic features ([place])   2. GAD (generalized anxiety disorder)       Lorin Glass, LCSW 12/21/2018

## 2018-12-22 ENCOUNTER — Other Ambulatory Visit (HOSPITAL_COMMUNITY): Payer: Self-pay

## 2018-12-22 ENCOUNTER — Telehealth (HOSPITAL_COMMUNITY): Payer: Self-pay | Admitting: Professional

## 2018-12-22 ENCOUNTER — Ambulatory Visit (HOSPITAL_COMMUNITY): Payer: Self-pay

## 2018-12-23 ENCOUNTER — Encounter (HOSPITAL_COMMUNITY): Payer: Self-pay | Admitting: Family

## 2018-12-23 NOTE — Progress Notes (Signed)
  [facility] Discharge Summary  Penny [name] [id number]  Admission date: 12/08/2018 Discharge date: 12/23/2018  Reason for admission: Depression/ Anxiety   Per assessment note:-Penny [name] is a 33 y.o. Caucasian female presents with depression and anxiety.  Patient present with a flat and guarded affect.  Reports a history of depression, anxiety and ADHD.  Reports she was followed by MD Harrington Challenger patient was prescribed Cymbalta, Vralar,  Klonopin and Adderall however had recently discontinued services due to admitting herself into Suboxone detox program. 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  However reports she was followed by Suboxone clinic more recently and is no longer taken "strong meds"  patient reports she has recently run out of her Cymbalta and Vraylar as she states she took her last one this morning.  NP will refill Vraylar and Cymbalta.  Patient is requesting to be restarted on Adderall Klonopin for anxiety and ADHD.  Discussed alternative medications.  Patient appeared to be agreeable to plan with some resistance to initiating new medications.   Chemical Use History: Reported history of addiction to opioids and has been treated and the suboxone clinic. - Patient to consider CDIOP.   Family of Origin Issues:Chart reviewed, Per the chaplain care discussion patient continues to work on setting boundaries.    Progress in Program Toward Treatment Goals: Ongoing, patient attended and participated  with daily group sessions.  Patient continued to ruminate with restarting Adderall and Klonopin.  Patient was evaluated by NP and MD Pucilowski for medication management and daily symptoms. Patient denied mood improvement and reported concerns with insomnia. reported by treatment teaming patient to follow-up with previous psychiatrist( MD Harrington Challenger) for medication  management.   Progress (rationale): Patient to transitions to Intensive Outpatient program (IOP) 12/26/2018   Of note refilled Cymbalta 60 mg, Vraylar 1.5mg  and initiated Seroquel 25 mg twice daily 50 mg nightly.  Take all medications as prescribed. Keep all follow-up appointments as scheduled.  Do not consume alcohol or use illegal drugs while on prescription medications. Report any adverse effects from your medications to your primary care provider promptly.  In the event of recurrent symptoms or worsening symptoms, call 911, a crisis hotline, or go to the nearest emergency department for evaluation.    Derrill Center, NP 12/23/2018

## 2018-12-25 ENCOUNTER — Ambulatory Visit (HOSPITAL_COMMUNITY): Payer: Self-pay

## 2018-12-25 ENCOUNTER — Other Ambulatory Visit (HOSPITAL_COMMUNITY): Payer: Self-pay | Admitting: Psychiatry

## 2018-12-25 ENCOUNTER — Telehealth (HOSPITAL_COMMUNITY): Payer: Self-pay | Admitting: *Deleted

## 2018-12-25 MED ORDER — QUETIAPINE FUMARATE 25 MG PO TABS: 25.0000 mg | ORAL_TABLET | Freq: Two times a day (BID) | ORAL | 2 refills | Status: DC

## 2018-12-25 NOTE — Telephone Encounter (Signed)
Dr Harrington Challenger Patient called & said Rx doesn't have her script for Seroquel I called after looking & saw this:  60 tablet 2 12/20/2018 12/20/2019   Sig - Route: Take 1 tablet (25 mg total) by mouth 2 (two) times daily as needed. Take 2 tablets ( 50 mg total) by mouth at night - Oral   Class: Print

## 2018-12-25 NOTE — Telephone Encounter (Signed)
sent 

## 2018-12-25 NOTE — Psych (Signed)
[facility] BH PHP THERAPIST PROGRESS NOTE  Penny [name] [id number]  Session Time: 9:00 - 11:00  Participation Level: Active  Behavioral Response: CasualAlertDepressed  Type of Therapy: Group Therapy  Treatment Goals addressed: Coping  Interventions: CBT, DBT, Solution Focused, Supportive and Reframing  Summary: Clinician led check-in regarding current stressors and situation, and review of patient completed daily inventory. Clinician utilized active listening and empathetic response and validated patient emotions. Clinician facilitated processing group on pertinent issues.   Therapist Response: AARIANA [name] is a 33 y.o. female who presents with depression and anxiety symptoms.  Patient arrived within time allowed and reports that she is feeling "bad." Patient rates her mood at a 3 on a scale of 1-10 with 10 being great. Pt presents with poor eye contact and soft speecg. Pt originally declines to do check-in and then completes very briefly. Pt indicates on check-in sheet that she is experiencing "seizure activity" and a slight tremor in her right hand is observable. Pt reports she was in a bad mental specae all yesterday afternoon and then the "seizure activity" began last night. Pt states she did not go to the hospital because "there's nothing they can do" and did not call EMS because "mom said not to." Pt continues to exhibit poor eye contact and as time passes becomes more agitated in her seat, shifting around and attempting to put herself into a ball in her chair. Cln consulted RN regarding this behavior who states he feels it is attention seeking rather than medical and that pt can be advised to go to the ED if she is concerned medically, or go home to rest if she feels that would be more beneficial as this office is not medical in nature nor can help with this issue. Cln relays this to pt and pt reports she does not want to go to the ED because there is nothing they can do to help her and  that she does not feel she should drive home. When pt returns to group after this discussion, normal behaviors are observed with pt eating a snack and engaging in discussion with normal posture and eye contact. Patient minimally engaged in discussion.         Session Time: 11:00 -12:00   Participation Level: Active   Behavioral Response: CasualAlertDepressed   Type of Therapy: Group Therapy, OT   Treatment Goals addressed: Coping   Interventions: Psychosocial skills training, Supportive,    Summary:  Occupational Therapy group   Therapist Response: Patient engaged in group. See OT note.          Session Time: 12:00 - 12:45  Participation Level: Active  Behavioral Response: CasualAlertDepressed  Type of Therapy: Group Therapy, Activity Therapy  Treatment Goals addressed: Coping  Interventions: Systems analyst, Supportive  Summary:  Reflection Group: Patients encouraged to practice skills and interpersonal techniques or work on mindfulness and relaxation techniques. The importance of self-care and making skills part of a routine to increase usage were stressed   Therapist Response: Patient engaged and participated appropriately.         Session Time: 12:45- 2:00  Participation Level: Active  Behavioral Response: CasualAlertDepressed  Type of Therapy: Group Therapy, Psychoeducation; Psychotherapy  Treatment Goals addressed: Coping  Interventions: CBT; Solution focused; Supportive; Reframing  Summary: 12:45 - 1:50: Cln continued topic of cognitive distortions. Group discussed how to "challenge" the unhealthy thought patterns once recognized. Group reviewed "Socratic Questions" handout and went over how to challenge irrational thoughts using that  handout. 1:50 -2:00 Clinician led check-out. Clinician assessed for immediate needs, medication compliance and efficacy, and safety concerns   Therapist Response: Patient engaged in activity. Pt states  understanding of how to challenge unhealthy thoughts and successfully reframed examples in discussion. Pt struggles to keep focused on herself during activity and would give examples of how people in her life needed to be challenged.  At [place], patient rates her mood at a 5 on a scale of 1-10 with 10 being great. Patient reports afternoon plans of doing chores.  Patient demonstrates some progress as evidenced by reporting improved mood throughout session. Patient denies SI/HI/self-harm at the end of group.      Suicidal/Homicidal: Nowithout intent/plan  Plan: Pt will continue in PHP while working to decrease depression and anxiety symptoms and increase ability to manage symptoms in a healthy manner.    Diagnosis: Severe episode of recurrent major depressive disorder, without psychotic features ([place]) [F33.2]    1. Severe episode of recurrent major depressive disorder, without psychotic features ([place])   2. GAD (generalized anxiety disorder)       Penny Glass, LCSW 12/25/2018

## 2018-12-26 ENCOUNTER — Other Ambulatory Visit (HOSPITAL_COMMUNITY): Payer: Self-pay

## 2018-12-26 ENCOUNTER — Ambulatory Visit (HOSPITAL_COMMUNITY): Payer: Self-pay

## 2018-12-28 ENCOUNTER — Other Ambulatory Visit (HOSPITAL_COMMUNITY): Payer: Self-pay

## 2018-12-28 NOTE — Psych (Signed)
[facility] PHP THERAPIST PROGRESS NOTE  Penny [name] [id number]  Session Time: 9:00 - 10:00  Participation Level: Minimal  Behavioral Response: CasualAlertDepressed  Type of Therapy: Group Therapy  Treatment Goals addressed: Coping  Interventions: CBT, DBT, Solution Focused, Supportive and Reframing  Summary: Clinician led check-in regarding current stressors and situation, and review of patient completed daily inventory. Clinician utilized active listening and empathetic response and validated patient emotions. Clinician facilitated processing group on pertinent issues.   Therapist Response: Penny [name] is a 33 y.o. female who presents with depression and anxiety symptoms.  Patient arrived late and reports that she is "on the struggle bus." Patient rates her mood at a 3 on a scale of 1-10 with 10 being great. Pt continues to state her appetite, concentration, and energy are poor. Patient minimally engaged in discussion.         Session Time: 10:00 - 11:00  Participation Level:Active  Behavioral Response:CasualAlertDepressed  Type of Therapy: Group Therapy, Psychotherapy  Treatment Goals addressed: Coping  Interventions:CBT, Solution focused, Supportive, Reframing  Summary: Cln led discussion on radical acceptance. Cln educated on the concept of radical acceptance and the process of application. Group members identified one "should" that is creating a barrier in their life and how to apply radical acceptance to it.   Therapist Response: Patient engaged in group.Pt reports should of "I should be in control of my body, but I'm not" and is able to reframe to allow that she is not in control of "all" aspects of her body, and she does have power within the situation.         Session Time: 11:00 - 12:00  Participation Level:Active  Behavioral Response:CasualAlertDepressed  Type of Therapy: Group Therapy, Psychotherapy  Treatment Goals  addressed: Coping  Interventions:CBT, Solution focused, Supportive, Reframing  Summary: Cln introduced topic of boundaries. Cln provided psychoeducation on what boundaries are, porous, rigid and healthy boundary characteristics, and the different types of boundaries. Group related the information to themselves to begin discovering potential boundary issues.  Therapist Response: Patient engaged in group.Pt reports understanding of boundaries and shares she has mainly porous boundaries, however is rigid when it comes to trust.          Session Time: 12:00- 1:00  Participation Level:Active  Behavioral Response:CasualAlertDepressed  Type of Therapy: Group Therapy, Psychoeducation; Psychotherapy  Treatment Goals addressed: Coping  Interventions:CBT; Solution focused; Supportive; Reframing  Summary:12:00 - 12:50 Clinician provided education on how to set boundaries. Cln utilized handout "How to set a Boundary" and "Healthy Boundary setting." Pt's shared boundary issues and group discussed how to handle them.  12:50 -1:00 Clinician led check-out. Clinician assessed for immediate needs, medication compliance and efficacy, and safety concerns   Therapist Response:Patient engaged activity and discussion. Pt reports understanding of how to set a boundary and is able to come up with real life examples. Pt focuses mainly on examples with her ex-husband and mother. At [place], patient rates her mood at [facility] a scale of 1-10 with 10 being great. Patient reports afternoon plans of cleaning her car and weekend plans of spending time with her boyfriend.Patient demonstrates some progress as evidenced by recognizing her barriers to acceptance and increased willingness to consider strategies to move through it. Patient denies SI/HI/self-harm at the end of group.      Suicidal/Homicidal: Nowithout intent/plan  Plan: Pt will continue in PHP while working to decrease  depression and anxiety symptoms and increase ability to manage symptoms in a healthy manner.  Diagnosis: Severe episode of recurrent major depressive disorder, without psychotic features ([place]) [F33.2]    1. Severe episode of recurrent major depressive disorder, without psychotic features ([place])   2. GAD (generalized anxiety disorder)       Lorin Glass, LCSW 12/28/2018

## 2018-12-29 ENCOUNTER — Other Ambulatory Visit (HOSPITAL_COMMUNITY): Payer: Self-pay

## 2018-12-29 ENCOUNTER — Ambulatory Visit (HOSPITAL_COMMUNITY): Payer: Self-pay

## 2018-12-29 NOTE — Psych (Signed)
[facility] PHP THERAPIST PROGRESS NOTE  Penny [name] [id number]  Session Time: 9:00 - 11:00  Participation Level: Active  Behavioral Response: CasualAlertDepressed  Type of Therapy: Group Therapy  Treatment Goals addressed: Coping  Interventions: CBT, DBT, Solution Focused, Supportive and Reframing  Summary: Clinician led check-in regarding current stressors and situation, and review of patient completed daily inventory. Clinician utilized active listening and empathetic response and validated patient emotions. Clinician facilitated processing group on pertinent issues.   Therapist Response: Penny [name] is a 33 y.o. female who presents with depression and anxiety symptoms.  Patient arrived late and reports that she is feeling "not great." Patient rates her mood at a 4 on a scale of 1-10 with 10 being great.  Pt reports she did spend the weekend with her boyfriend and it went well but she felt low. Pt states there is a "numb feeling" in her arms, legs, hands, and feet. Provider will be notified. Pt continues to struggle with applying skills outside of group. Patient engaged in discussion.         Session Time: 11:00 -12:00   Participation Level: Active   Behavioral Response: CasualAlertDepressed   Type of Therapy: Group Therapy, OT   Treatment Goals addressed: Coping   Interventions: Psychosocial skills training, Supportive,    Summary:  Occupational Therapy group   Therapist Response: Patient engaged in group. See OT note.         Session Time: 12:00 - 12:45  Participation Level: Active  Behavioral Response: CasualAlertDepressed  Type of Therapy: Group Therapy, Activity Therapy  Treatment Goals addressed: Coping  Interventions: Systems analyst, Supportive  Summary:  Reflection Group: Patients encouraged to practice skills and interpersonal techniques or work on mindfulness and relaxation techniques. The importance of self-care and making skills  part of a routine to increase usage were stressed   Therapist Response: Patient engaged and participated appropriately.        Session Time: 12:45- 2:00  Participation Level: Active  Behavioral Response: CasualAlertDepressed  Type of Therapy: Group Therapy, Psychoeducation; Psychotherapy  Treatment Goals addressed: Coping  Interventions: CBT; Solution focused; Supportive; Reframing  Summary: 1:00 - 1:50: Clinician introduced topic of self esteem. Group ranked how they view their current self esteem. Cln provided psycho-education on the roots of healthy/unhealthy self esteem. Group reviewed CBT based model for altering core beliefs and addressing the maladaptive rules stemming from low self esteem. Group completed examples from both models.  1:50 -2:00 Clinician led check-out. Clinician assessed for immediate needs, medication compliance and efficacy, and safety concerns   Therapist Response: Patient engaged in activity and discussion. Pt identifies their self esteem as low and rates it a 3 on a 1-10 scale with 10 being great. Pt reports contributing factors to low self esteem as not working, living with her parents, and not utilizing self care. Pt completed examples for altering core beliefs and maladaptive behaviors and chose not to share with the group.  At [place], patient rates her mood at a 5 on a scale of 1-10 with 10 being great. Patient reports afternoon plans of the "normal" afternoon routine with her children. Patient demonstrates some progress as evidenced by periods of active engagement with group material. Patient denies SI/HI/self-harm at the end of group.       Suicidal/Homicidal: Nowithout intent/plan  Plan: Pt will continue in PHP while working to decrease depression and anxiety symptoms and increase ability to manage symptoms in a healthy manner.    Diagnosis: Severe episode  of recurrent major depressive disorder, without psychotic features ([place])  [F33.2]    1. Severe episode of recurrent major depressive disorder, without psychotic features ([place])   2. GAD (generalized anxiety disorder)       Penny Glass, LCSW 12/29/2018

## 2019-01-01 ENCOUNTER — Ambulatory Visit (HOSPITAL_COMMUNITY): Payer: Self-pay

## 2019-01-01 IMAGING — US US ABDOMEN LIMITED
1 series · 14 of 25 positions shown · non-contrast
Comparison: None.

CLINICAL DATA: Elevated LFTs.

EXAM:
US ABDOMEN LIMITED - RIGHT UPPER QUADRANT

[Series 1: us abdomen limited · 0.24mm/px · 14 of 56 slices shown]
[im 1/56]
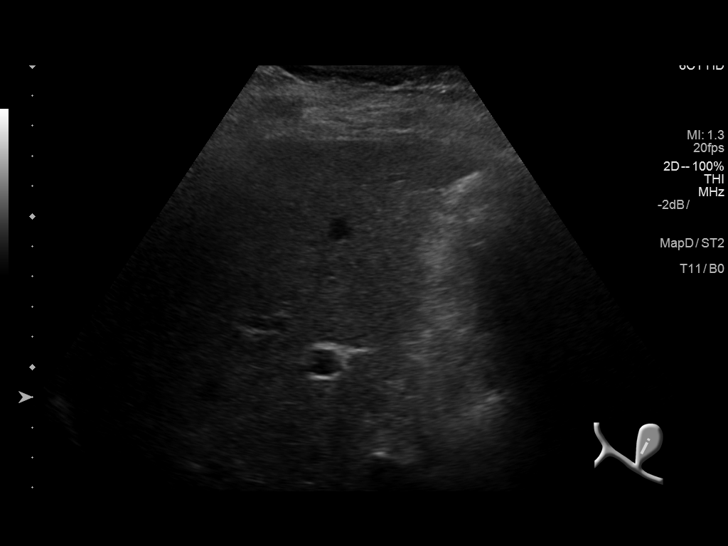
[im 5/56]
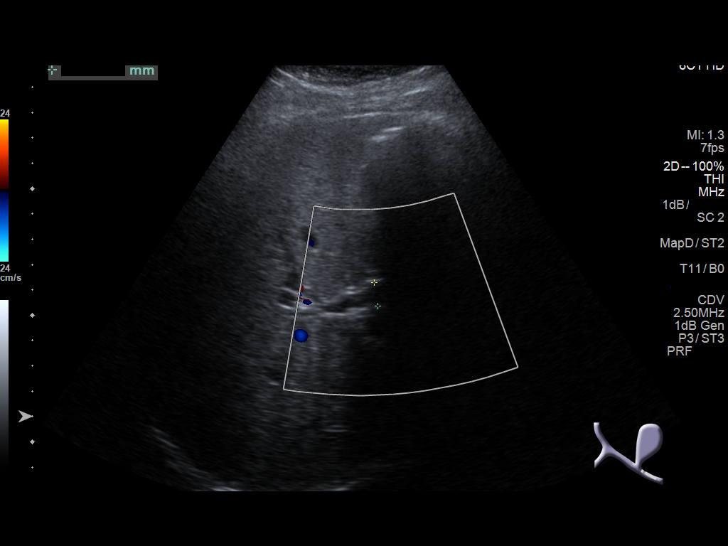
[im 10/56]
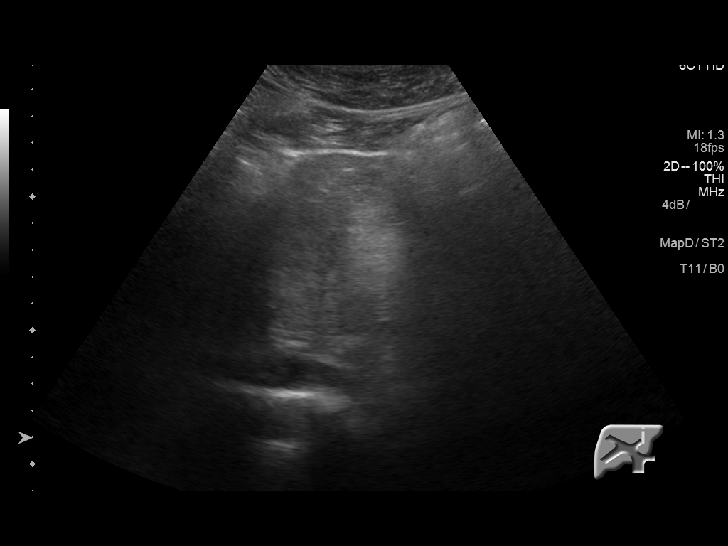
[im 14/56]
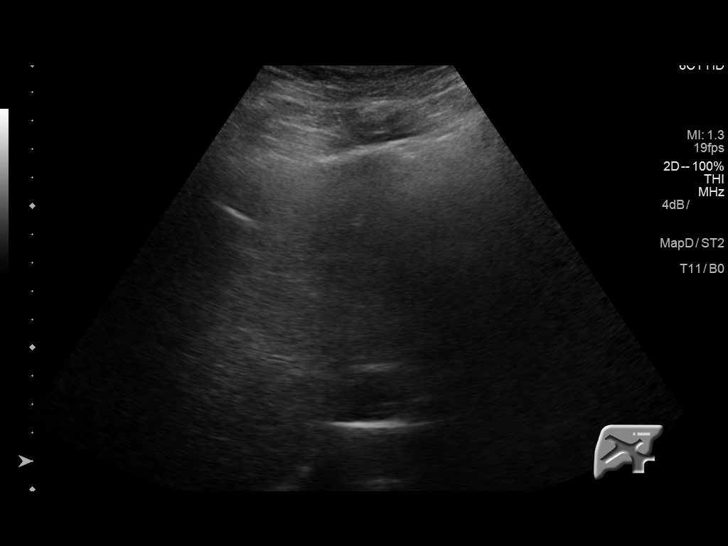
[im 19/56]
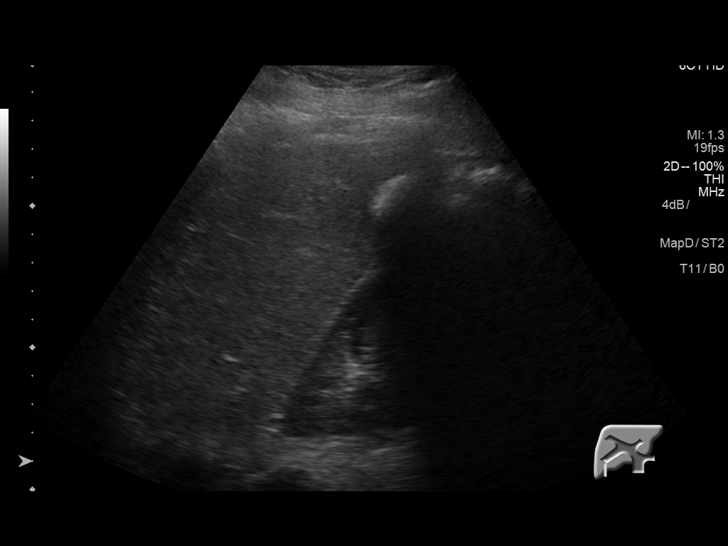
[im 21/56]
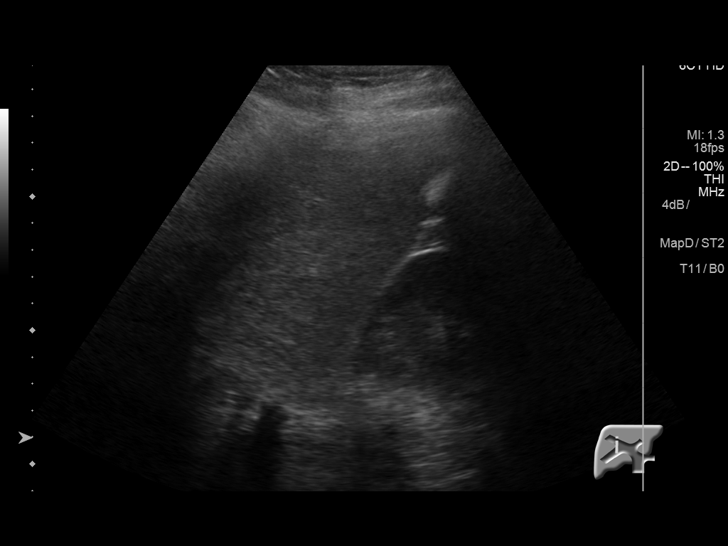
[im 26/56]
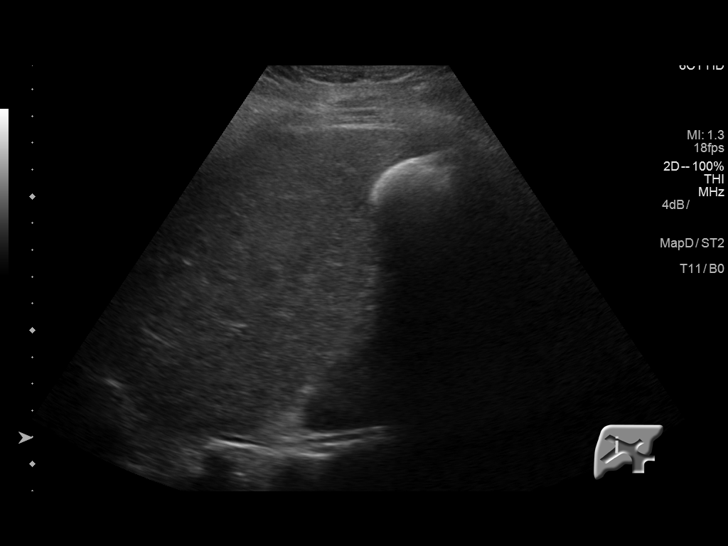
[im 30/56]
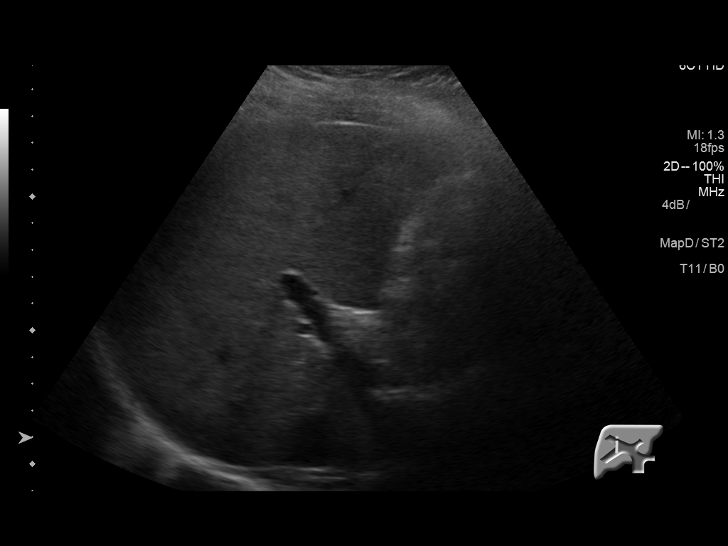
[im 35/56]
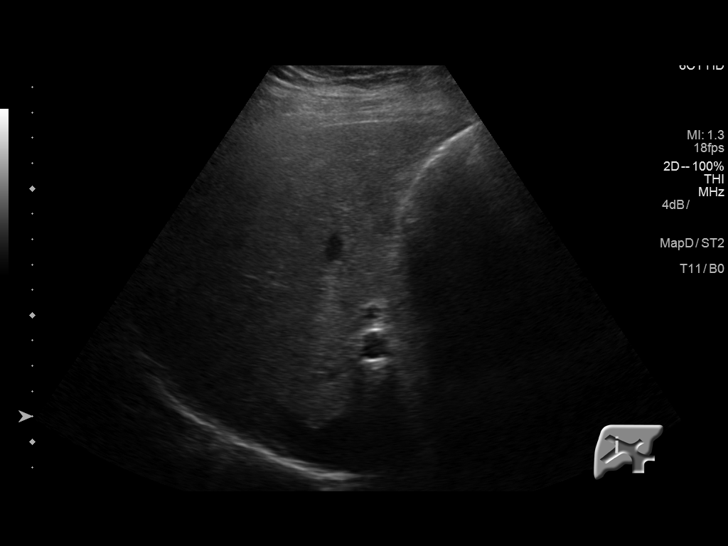
[im 37/56]
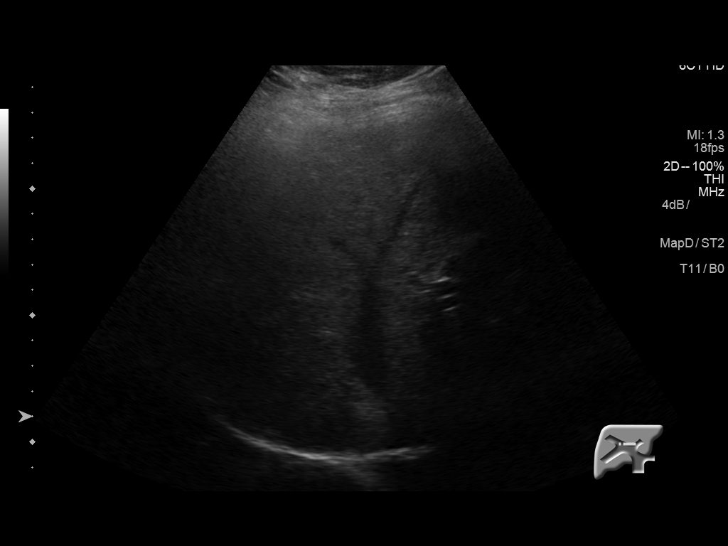
[im 42/56]
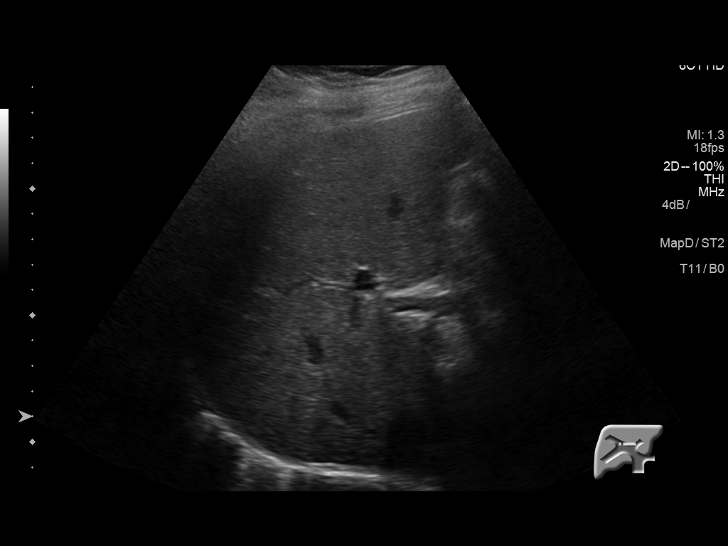
[im 46/56]
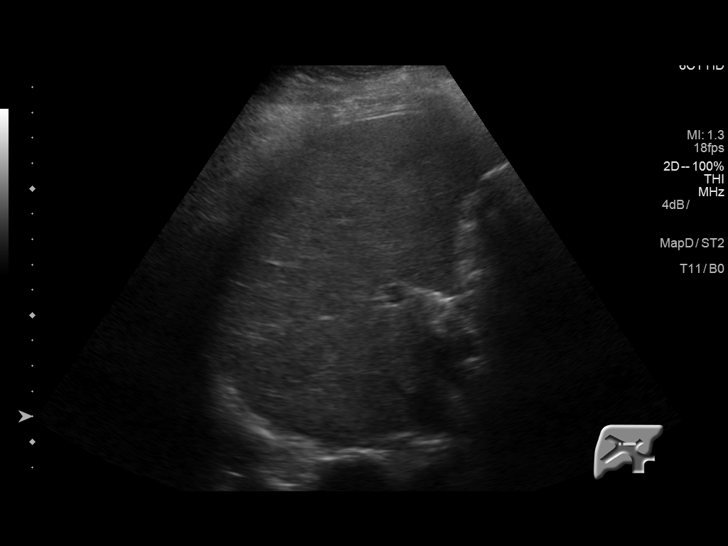
[im 51/56]
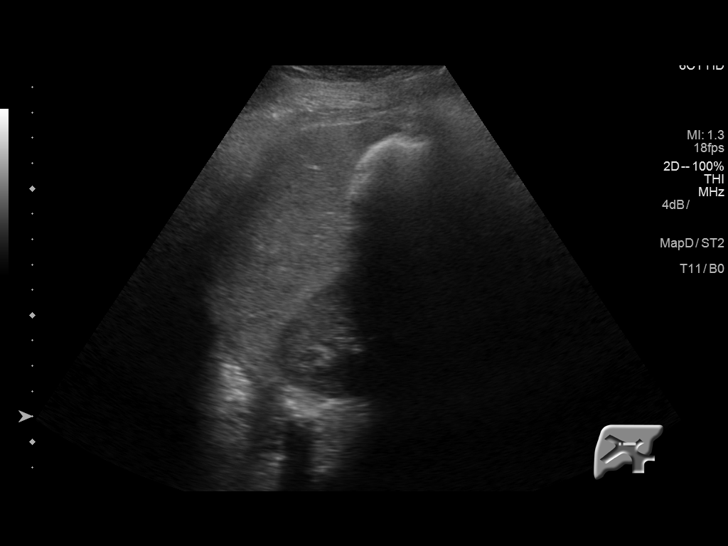
[im 56/56]
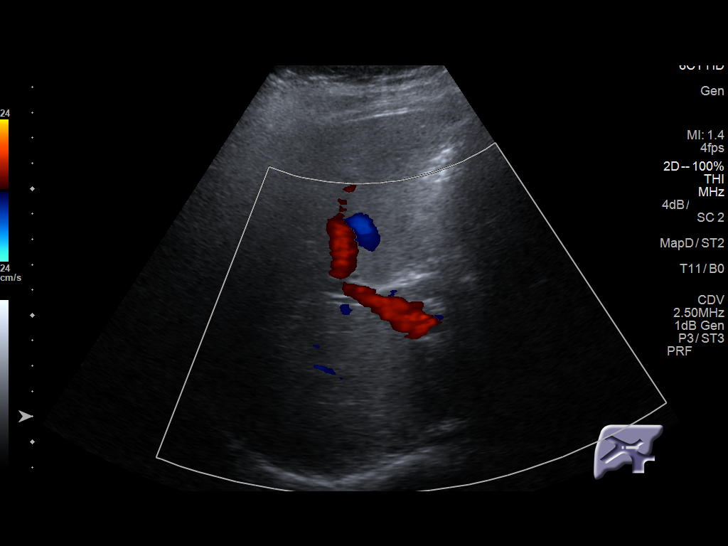

[14 of 25 positions shown; findings below may reference images not displayed]

FINDINGS: Gallbladder:

Prior cholecystectomy.

Common bile duct:

Diameter: 9.4 mm.  Compatible with post cholecystectomy state.

Liver:

No focal lesion identified. Increased hepatic parenchymal
echogenicity. No intrahepatic biliary ductal dilatation.
IMPRESSION: 1. Increased hepatic echogenicity as can be seen with hepatic
steatosis.
2. Prior cholecystectomy.

## 2019-01-01 NOTE — Psych (Signed)
   [facility] PHP THERAPIST PROGRESS NOTE  [name] [id number]  Session Time: 9:00 - 11:00  Participation Level: Active  Behavioral Response: CasualAlertDepressed  Type of Therapy: Group Therapy  Treatment Goals addressed: Coping  Interventions: CBT, DBT, Solution Focused, Supportive and Reframing  Summary: Clinician led check-in regarding current stressors and situation, and review of patient completed daily inventory. Clinician utilized active listening and empathetic response and validated patient emotions. Clinician facilitated processing group on pertinent issues.   Therapist Response: Penny [name] is a 33 y.o. female who presents with depression and anxiety symptoms.  Patient arrived within time allowed and reports that she is feeling "grumpy". Patient rates her mood at a 5 on a scale of 1-10 with 10 being great. Pt states that she had a "pretty chaotic" morning getting kids ready for school. Pt states that she went to her doctor's appointment yesterday and that the situation with her workman's comp is "getting ugly", and that the lawyers are getting involved. Pt states that she spent the rest of the afternoon going to the grocery store, cooked dinner for kids and relaxed. Patient continues to struggle with feeling "numb." Patient engaged in discussion.         Session Time: 11:00 -12:15  Participation Level: Active  Behavioral Response: CasualAlertDepressed  Type of Therapy: Group Therapy, psychotherapy  Treatment Goals addressed: Coping  Interventions: Strengths based, reframing, Supportive,   Summary:  Spiritual Care group  Therapist Response: Patient engaged in group. See chaplain note.         Session Time: 12:15 - 1:00  Participation Level: Active  Behavioral Response: CasualAlertDepressed  Type of Therapy: Group Therapy  Treatment Goals addressed: Coping  Interventions: Systems analyst, Supportive  Summary:  Reflection Group:  Patients encouraged to practice skills and interpersonal techniques or work on mindfulness and relaxation techniques. The importance of self-care and making skills part of a routine to increase usage were stressed   Therapist Response: Patient engaged and participated appropriately.         Session Time: 1:00- 2:00  Participation Level: Active  Behavioral Response: CasualAlertDepressed  Type of Therapy: Group Therapy, Psychoeducation  Treatment Goals addressed: Coping  Interventions: relaxation training; Supportive; Reframing  Summary: 12:45 - 1:50: Relaxation group: Cln led group focused on retraining the body's response to stress.   1:50 -2:00 Clinician led check-out. Clinician assessed for immediate needs, medication compliance and efficacy, and safety concerns   Therapist Response: Patient engaged in activity and discussion. At [place], patient rates her mood at a 6 on a scale of 1-10 with 10 being great. Patient reports afternoon plans to go to another doctor's appointment. Patient demonstrates some progress as evidenced by seeking to apply her skills in talking to her children about her mental health. Patient denies SI/HI/self-harm thoughts at the end of group.       Suicidal/Homicidal: Nowithout intent/plan  Plan: Pt will continue in PHP while working to decrease depression and anxiety symptoms and increase ability to manage symptoms in a healthy manner.    Diagnosis: Severe episode of recurrent major depressive disorder, without psychotic features ([place]) [F33.2]    1. Severe episode of recurrent major depressive disorder, without psychotic features ([place])   2. Opioid use disorder, mild, abuse ([place])   3. GAD (generalized anxiety disorder)       Lorin Glass, LCSW 01/01/2019

## 2019-01-01 NOTE — Psych (Signed)
   [facility] PHP THERAPIST PROGRESS NOTE  PANSY OSTROVSKY [id number]  Session Time: 9:00 - 11:00  Participation Level: Active  Behavioral Response: CasualAlertDepressed  Type of Therapy: Group Therapy  Treatment Goals addressed: Coping  Interventions: CBT, DBT, Solution Focused, Supportive and Reframing  Summary: Clinician led check-in regarding current stressors and situation, and review of patient completed daily inventory. Clinician utilized active listening and empathetic response and validated patient emotions. Clinician facilitated processing group on pertinent issues.   Therapist Response: MELVA FAUX is a 33 y.o. female who presents with depression and anxiety symptoms.  Patient arrived late and reports that she is feeling "anxious." Patient rates her mood at a 5 on a scale of 1-10 with 10 being great.  Pt states her afternoon yesterday was "not too bad" and that she spent time with her children. Pt reports she took a nap in the afternoon and slept poorly that night. Pt continues to struggle with making changes in her routine. Patient engaged in discussion.         Session Time: 11:00 -12:00   Participation Level: Active   Behavioral Response: CasualAlertDepressed   Type of Therapy: Group Therapy, OT   Treatment Goals addressed: Coping   Interventions: Psychosocial skills training, Supportive,    Summary:  Occupational Therapy group   Therapist Response: Patient engaged in group. See OT note.         Session Time: 12:00 - 12:45  Participation Level: Active  Behavioral Response: CasualAlertDepressed  Type of Therapy: Group Therapy, Activity Therapy  Treatment Goals addressed: Coping  Interventions: Systems analyst, Supportive  Summary:  Reflection Group: Patients encouraged to practice skills and interpersonal techniques or work on mindfulness and relaxation techniques. The importance of self-care and making skills part of a routine to increase  usage were stressed   Therapist Response: Patient engaged and participated appropriately.        Session Time: 12:45- 2:00  Participation Level: Active  Behavioral Response: CasualAlertDepressed  Type of Therapy: Group Therapy, Psychoeducation; Psychotherapy  Treatment Goals addressed: Coping  Interventions: CBT; Solution focused; Supportive; Reframing  Summary: 12:45- 1:50: Cln introduced topic of vulnerability. Group viewed TED talk, "The Power of Vulnerability" and pt's discussed how the topic plays out in their lives. 12:50 -1:00 Clinician led check-out. Clinician assessed for immediate needs, medication compliance and efficacy, and safety concerns   Therapist Response: Patient engaged in discussion. Ptreports she is aware of her struggle with vulnerability and discusses her roots of trust issues and how this connects.  At [place], patient rates her mood at [facility] a scale of 1-10 with 10 being great. Pt reports afternoon plans of going to an appointment with her back doctor, picking up her children, and making dinner. Patient demonstrates some progress as evidenced byopenness regarding past relationships and what barriers that creates for her. Patient denies SI/HI/self-harm thoughts at the end of group.      Suicidal/Homicidal: Nowithout intent/plan  Plan: Pt will continue in PHP while working to decrease depression and anxiety symptoms and increase ability to manage symptoms in a healthy manner.    Diagnosis: Severe episode of recurrent major depressive disorder, without psychotic features ([place]) [F33.2]    1. Severe episode of recurrent major depressive disorder, without psychotic features ([place])   2. GAD (generalized anxiety disorder)       Lorin Glass, LCSW 01/01/2019

## 2019-01-02 ENCOUNTER — Telehealth (HOSPITAL_COMMUNITY): Payer: Self-pay | Admitting: *Deleted

## 2019-01-02 ENCOUNTER — Ambulatory Visit (HOSPITAL_COMMUNITY): Payer: Self-pay

## 2019-01-02 NOTE — Telephone Encounter (Signed)
I don't start new medcines over the phone

## 2019-01-02 NOTE — Telephone Encounter (Signed)
Dr Harrington Challenger Patient called stating on last visit there was a discussion about Strattera. Patient is interested in starting that mediction

## 2019-01-05 ENCOUNTER — Ambulatory Visit (HOSPITAL_COMMUNITY): Payer: Self-pay

## 2019-01-09 ENCOUNTER — Ambulatory Visit (HOSPITAL_COMMUNITY): Payer: BLUE CROSS/BLUE SHIELD | Admitting: Licensed Clinical Social Worker

## 2019-01-12 ENCOUNTER — Telehealth (HOSPITAL_COMMUNITY): Payer: Self-pay | Admitting: *Deleted

## 2019-01-12 NOTE — Telephone Encounter (Signed)
Dr Harrington Challenger Patient LVM asking if you would increase her Seroquel?

## 2019-01-12 NOTE — Telephone Encounter (Signed)
I need to know the reason.Also, she has no appt set up with me

## 2019-01-12 NOTE — Telephone Encounter (Signed)
LVM per provider Dr Harrington Challenger: need to know the reason asking for increase on Seroquel. Also, she has no appt set up with me.

## 2019-01-15 ENCOUNTER — Ambulatory Visit (INDEPENDENT_AMBULATORY_CARE_PROVIDER_SITE_OTHER): Payer: BLUE CROSS/BLUE SHIELD | Admitting: Licensed Clinical Social Worker

## 2019-01-15 DIAGNOSIS — F332 Major depressive disorder, recurrent severe without psychotic features: Secondary | ICD-10-CM

## 2019-01-15 DIAGNOSIS — F411 Generalized anxiety disorder: Secondary | ICD-10-CM

## 2019-01-15 NOTE — Telephone Encounter (Signed)
Dr Harrington Challenger Patient returned call today concerning reason asking for increase on Seroquel. She LVM stating that she takes It during the day to help with panic attacks & in the evening as instructed to help sleep. She said she would  Be making appointment to return to see you.

## 2019-01-15 NOTE — Progress Notes (Signed)
no show. PT called to say she is sick on the afternoon of her appointment.

## 2019-01-16 NOTE — Telephone Encounter (Signed)
It is already prescribed for twice a day. She is also on vraylar which is a similar med in the same class

## 2019-01-25 ENCOUNTER — Other Ambulatory Visit (HOSPITAL_COMMUNITY): Payer: Self-pay | Admitting: Psychiatry

## 2019-01-25 ENCOUNTER — Telehealth (HOSPITAL_COMMUNITY): Payer: Self-pay | Admitting: *Deleted

## 2019-01-25 ENCOUNTER — Ambulatory Visit (HOSPITAL_COMMUNITY): Payer: BLUE CROSS/BLUE SHIELD | Admitting: Psychiatry

## 2019-01-25 MED ORDER — CLONIDINE HCL 0.1 MG PO TABS: 0.1000 mg | ORAL_TABLET | Freq: Every day | ORAL | 0 refills | Status: DC

## 2019-01-25 NOTE — Telephone Encounter (Signed)
Yes, the dosage is 0.1 mg and I sent it in

## 2019-01-25 NOTE — Telephone Encounter (Signed)
Dr Harrington Challenger Patient called 7 stated she once was taking Clonidine 10 mg. And asked if she could possibly go back on that medication? Marland Kitchen Next visit rescheduled due to weather 02/09/2019

## 2019-02-01 ENCOUNTER — Telehealth: Payer: Self-pay | Admitting: Adult Health

## 2019-02-01 MED ORDER — KETOROLAC TROMETHAMINE 10 MG PO TABS: 10.0000 mg | ORAL_TABLET | Freq: Four times a day (QID) | ORAL | 0 refills | Status: DC | PRN

## 2019-02-01 MED ORDER — VALACYCLOVIR HCL 1 G PO TABS: ORAL_TABLET | ORAL | 99 refills | Status: DC

## 2019-02-01 MED ORDER — CYCLOBENZAPRINE HCL 10 MG PO TABS: 10.0000 mg | ORAL_TABLET | Freq: Three times a day (TID) | ORAL | 1 refills | Status: DC | PRN

## 2019-02-01 NOTE — Telephone Encounter (Signed)
Left message @ 11:58 am. JSY

## 2019-02-01 NOTE — Telephone Encounter (Signed)
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

## 2019-02-01 NOTE — Telephone Encounter (Signed)
Spoke with pt. Pt is requesting a refill on Flexeril 10 mg and Tramadol. "Has not had a flare up in a really long time". It's been a while since pt was on these meds. Please advise. Thanks! [place]

## 2019-02-01 NOTE — Telephone Encounter (Signed)
Patient called, stated that she's been waiting on a refill for a few days now, she's checking on it.  [place]  [phone]

## 2019-02-07 ENCOUNTER — Ambulatory Visit (HOSPITAL_COMMUNITY): Payer: BLUE CROSS/BLUE SHIELD | Admitting: Licensed Clinical Social Worker

## 2019-02-09 ENCOUNTER — Encounter (HOSPITAL_COMMUNITY): Payer: Self-pay | Admitting: Psychiatry

## 2019-02-09 ENCOUNTER — Ambulatory Visit (INDEPENDENT_AMBULATORY_CARE_PROVIDER_SITE_OTHER): Payer: BLUE CROSS/BLUE SHIELD | Admitting: Psychiatry

## 2019-02-09 VITALS — BP 120/81 | HR 96 | Ht 66.5 in | Wt 185.0 lb

## 2019-02-09 DIAGNOSIS — F332 Major depressive disorder, recurrent severe without psychotic features: Secondary | ICD-10-CM | POA: Diagnosis not present

## 2019-02-09 DIAGNOSIS — F411 Generalized anxiety disorder: Secondary | ICD-10-CM

## 2019-02-09 MED ORDER — TOPIRAMATE 50 MG PO TABS: ORAL_TABLET | ORAL | 2 refills | Status: DC

## 2019-02-09 MED ORDER — CARBAMAZEPINE 200 MG PO TABS: 400.0000 mg | ORAL_TABLET | Freq: Every day | ORAL | 2 refills | Status: DC

## 2019-02-09 MED ORDER — CARIPRAZINE HCL 1.5 MG PO CAPS: 1.5000 mg | ORAL_CAPSULE | Freq: Every day | ORAL | 2 refills | Status: DC

## 2019-02-09 MED ORDER — AMPHETAMINE-DEXTROAMPHET ER 30 MG PO CP24: ORAL_CAPSULE | ORAL | 0 refills | Status: DC

## 2019-02-09 MED ORDER — CLONIDINE HCL 0.2 MG PO TABS: 0.2000 mg | ORAL_TABLET | Freq: Two times a day (BID) | ORAL | 2 refills | Status: DC

## 2019-02-09 NOTE — Progress Notes (Signed)
BH MD/PA/NP OP Progress Note  02/09/2019 9:52 AM LASHAWN [name]  MRN:  [id number]  Chief Complaint:  Chief Complaint    Anxiety; Depression; Manic Behavior; ADD     HPI: This patient is a 33 year old divorced white female who lives with her 2 children in [place].  However she has a new boyfriend in [place] and is been spending a lot of time with him.  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  The patient returns for follow-up after 2 months.  She states that she is doing better.  She is in a new relationship and makes her feel happy.  She does not seem as irritable angry or demanding.  She is not using any form of opiates right now.  She states she is not sleeping all that well and would like an increase in the clonidine.  She also ran out of Topamax.  She denies any thoughts of self-harm or suicidal ideation.  She is here with her 62-year-old daughter and seems very attentive with her.  She does think the Vraylar helps her mood, she is about to start a phlebotomy course and asked if she can go back on the Adderall XR and I think this is reasonable at this point. Visit Diagnosis:    ICD-10-CM   1. Severe episode of recurrent major depressive disorder, without psychotic features ([place]) F33.2   2. GAD (generalized anxiety disorder) F41.1     Past Psychiatric History: Previous psychiatric hospitalization several years ago  Past Medical History:  Past Medical History:  Diagnosis Date  . ADD (attention deficit disorder)   . Anemia 2011   2o to GASTRIC BYPASS  . Anginal pain ([place])   . Anxiety   . Blood transfusion without reported diagnosis   . Chronic daily headache   . Depression   . Depression   . Dysmenorrhea 09/18/2013  . Dysrhythmia   . Elevated liver enzymes JUL 2011ALK PHOS 111-127 AST  143-267 ALT  213-321T BILI 0.6  ALB  3.7-4.06 Jun 2011 ALK PHOS 118 AST 24 ALT 42 T BILI 0.4 ALB 3.9  . Encounter for drug rehabilitation    at behavioral health for  opioid addiction about 4 years ago  . Family history of adverse reaction to anesthesia    'dad had to be kept on pump for breathing for morphine'  . Fatty liver   . Fibroid 01/18/2017  . Fibromyalgia   . Gastric bypass status for obesity   . Gastritis JULY 2011  . Heart murmur   . 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 History of Holter monitoring   . Hx of opioid abuse ([place])    for about 4 years, about 4 years ago  . Interstitial cystitis   . Iron deficiency anemia 07/23/2010  . Irritable bowel syndrome 2012 DIARRHEA   JUN 2012 TTG IgA 14.9  . IUD FEB 2010  . Lupus ([place])   . Menorrhagia 07/18/2013  . Migraines   . Obesity (BMI 30-39.9) 2011 228 LBS BMI 36.8  . Ovarian cyst   . Patient desires pregnancy 09/18/2013  . Polyneuropathy   . PONV (postoperative nausea and vomiting) seizure post-operatively   seizure following ankle surgery  . Potassium (K) deficiency   . Pregnant 12/25/2013  . Psychiatric pseudoseizure   . RLQ abdominal pain 07/18/2013  . Sciatica of left side 11/14/2014  . 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 Stress 09/18/2013  Past Surgical History:  Procedure Laterality Date  . BIOPSY  04/10/2015   Procedure: BIOPSY;  Surgeon: Daneil Dolin, MD;  Location: [facility];  Service: Endoscopy;;  . cath self every nite     for sodium bicarb injection (discontinued 2013)  . CHOLECYSTECTOMY  2005   biliary dyskinesia  . COLONOSCOPY  JUN 2012 ABD PN/DIARRHEA WITH PROPOFOL   NL COLON  . DILATION AND CURETTAGE OF UTERUS    . DILITATION & CURRETTAGE/HYSTROSCOPY WITH NOVASURE ABLATION N/A 03/24/2017   Procedure: DILATATION & CURETTAGE/HYSTEROSCOPY WITH NOVASURE ENDOMETRIAL ABLATION;  Surgeon: Jonnie Kind, MD;  Location: [facility];  Service: Gynecology;  Laterality: N/A;  . ESOPHAGEAL DILATION N/A 04/10/2015   Procedure: ESOPHAGEAL DILATION WITH 54FR MALONEY DILATOR;  Surgeon: Daneil Dolin, MD;  Location: [facility];  Service: Endoscopy;  Laterality:  N/A;  . ESOPHAGOGASTRODUODENOSCOPY    . ESOPHAGOGASTRODUODENOSCOPY (EGD) WITH PROPOFOL N/A 04/10/2015   Procedure: ESOPHAGOGASTRODUODENOSCOPY (EGD) WITH PROPOFOL;  Surgeon: Daneil Dolin, MD;  Location: [facility];  Service: Endoscopy;  Laterality: N/A;  . ESOPHAGOGASTRODUODENOSCOPY (EGD) WITH PROPOFOL N/A 12/06/2016   Procedure: ESOPHAGOGASTRODUODENOSCOPY (EGD) WITH PROPOFOL;  Surgeon: Danie Binder, MD;  [facility];  Service: Endoscopy;  Laterality: N/A;  . GAB  2007   in [place]-POUCH 5 CM  . GASTRIC BYPASS  06/2006  . HYSTEROSCOPY W/D&C N/A 09/12/2014   Procedure: DILATATION AND CURETTAGE /HYSTEROSCOPY;  Surgeon: Jonnie Kind, MD;  Location: [facility];  Service: Gynecology;  Laterality: N/A;  . KYPHOPLASTY N/A 08/02/2018   Procedure: KYPHOPLASTY T12;  Surgeon: Melina Schools, MD;  Location: [facility];  Service: Orthopedics;  Laterality: N/A;  60 mins  . LAPAROSCOPIC TUBAL LIGATION Bilateral 03/24/2017   Procedure: LAPAROSCOPIC TUBAL LIGATION ([place] Rings);  Surgeon: Jonnie Kind, MD;  Location: [facility];  Service: Gynecology;  Laterality: Bilateral;  . REPAIR VAGINAL CUFF N/A 07/30/2014   Procedure: REPAIR VAGINAL CUFF;  Surgeon: Mora Bellman, MD;  Location: [facility];  Service: Gynecology;  Laterality: N/A;  . SAVORY DILATION  06/20/2012   Dr. Barnie Alderman gastritis/Ulcer in the mid jejunum. Empiric dilation.   . SURAL NERVE BX Left 02/25/2016   Procedure: LEFT SURAL NERVE BIOPSY;  Surgeon: Jovita Gamma, MD;  Location: [facility];  Service: Neurosurgery;  Laterality: Left;  Left sural nerve biopsy  . TONSILLECTOMY    . TONSILLECTOMY AND ADENOIDECTOMY    . UPPER GASTROINTESTINAL ENDOSCOPY  JULY 2011 NAUSEA-D125,V6, PH 25   Bx; GASTRITIS, POUCH-5 CM LONG  . WISDOM TOOTH EXTRACTION      Family Psychiatric History: See below  Family History:  Family History  Problem Relation Age of Onset  . Hemochromatosis Maternal Grandmother   . Migraines Maternal Grandmother   . Cancer  Maternal Grandmother   . Breast cancer Maternal Grandmother   . Hypertension Father   . Diabetes Father   . Coronary artery disease Father   . Migraines Paternal Grandmother   . Breast cancer Paternal Grandmother   . Cancer Mother        breast  . Hemochromatosis Mother   . Breast cancer Mother   . Depression Mother   . Anxiety disorder Mother   . Coronary artery disease Paternal Grandfather   . Anxiety disorder Brother   . Bipolar disorder Brother   . Healthy Daughter   . Healthy Son     Social History:  Social History   Socioeconomic History  . Marital status: Divorced    Spouse name: Not  on file  . Number of children: 2  . Years of education: some colle  . Highest education level: Not on file  Occupational History    Employer: NOT EMPLOYED  Social Needs  . Financial resource strain: Very hard  . Food insecurity:    Worry: Never true    Inability: Never true  . Transportation needs:    Medical: No    Non-medical: No  Tobacco Use  . Smoking status: Current Every Day Smoker    Packs/day: 0.25    Years: 6.00    Pack years: 1.50    Types: E-cigarettes    Start date: 05/2018  . Smokeless tobacco: Never Used  Substance and Sexual Activity  . Alcohol use: Yes    Alcohol/week: 0.0 standard drinks    Comment: 1-2 beers a couple times a month   . Drug use: Not Currently    Types: Marijuana    Comment: Previously opioid addiction went through rehab  . Sexual activity: Yes    Partners: Male    Birth control/protection: Surgical, Condom    Comment: tubal and ablation  Lifestyle  . Physical activity:    Days per week: 0 days    Minutes per session: 0 min  . Stress: Very much  Relationships  . Social connections:    Talks on phone: More than three times a week    Gets together: More than three times a week    Attends religious service: More than 4 times per year    Active member of club or organization: No    Attends meetings of clubs or organizations: Never     Relationship status: Divorced  Other Topics Concern  . Not on file  Social History Narrative   Patient is right handed.   Patient drinks one cup of coffee daily.   She lives in a one-story home with her two children (17 year old son, 47 month old daughter).   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 Gabapentin Other (See Comments)    Pt states that she was unresponsive after taking this medication, but her vitals remained stable.    . Metoclopramide Hcl Anxiety and Other (See Comments)    Pt states that she felt like she was trapped in a box, and could not get out.  Pt also states that she had temporary loss of movement, weakness, and tingling.    . Tramadol Other (See Comments)    Seizures  . Ativan [Lorazepam] Other (See Comments)    combative  . Latex Itching, Rash and Other (See Comments)    Burns  . Lyrica [Pregabalin] Other (See Comments)    suicidal  . Trazodone And Nefazodone Other (See Comments)    Makes pt "like a zombie"  . Zofran [Ondansetron] Other (See Comments)    Migraines in high doses    . Tapentadol Hcl   . Butrans [Buprenorphine] Rash    Patch caused rash, didn't help with pain  . Nucynta [Tapentadol] Nausea And Vomiting  . Propofol Nausea And Vomiting  . Tape Itching, Rash and Other (See Comments)    Please use paper tape  . Toradol [Ketorolac Tromethamine] Anxiety    Metabolic Disorder Labs: Lab Results  Component Value Date   HGBA1C 5.7 (H) 01/15/2015   Lab Results  Component Value Date   PROLACTIN 80.0 (H) 05/09/2015   Lab  Results  Component Value Date   CHOL 95 03/10/2012   TRIG 50 03/10/2012   HDL 38 (L) 03/10/2012   CHOLHDL 2.5 03/10/2012   VLDL 10 03/10/2012   LDLCALC 47 03/10/2012   Lab Results  Component Value Date   TSH 1.780 12/14/2018   TSH 0.977 11/26/2015    Therapeutic Level Labs: No results found for: LITHIUM No results  found for: VALPROATE No components found for:  CBMZ  Current Medications: Current Outpatient Medications  Medication Sig Dispense Refill  . amphetamine-dextroamphetamine (ADDERALL XR) 30 MG 24 hr capsule TAKE (1) CAPSULE BY MOUTH EVERY DAY. 30 capsule 0  . carbamazepine (TEGRETOL) 200 MG tablet Take 2 tablets (400 mg total) by mouth at bedtime. 60 tablet 2  . cariprazine (VRAYLAR) capsule Take 1 capsule (1.5 mg total) by mouth daily. 60 capsule 2  . cyclobenzaprine (FLEXERIL) 10 MG tablet Take 1 tablet (10 mg total) by mouth every 8 (eight) hours as needed for muscle spasms. 30 tablet 1  . DULoxetine (CYMBALTA) 60 MG capsule Take 1 capsule (60 mg total) by mouth at bedtime. 60 capsule 2  . ketorolac (TORADOL) 10 MG tablet Take 1 tablet (10 mg total) by mouth every 6 (six) hours as needed. 20 tablet 0  . Multiple Vitamins-Iron (MULTIVITAMIN/IRON PO) Take 2 tablets by mouth daily.    . pantoprazole (PROTONIX) 40 MG tablet Take 1 tablet (40 mg total) by mouth daily. 90 tablet 3  . phenazopyridine (PYRIDIUM) 200 MG tablet Take 200 mg by mouth 3 (three) times daily as needed for pain.    Marland Kitchen PROAIR HFA 108 (90 Base) MCG/ACT inhaler INHALE 2 PUFFS INTO THE LUNGS EVERY 6 HOURS AS NEEDED. (Patient taking differently: Inhale 2 puffs into the lungs every 6 (six) hours as needed for wheezing or shortness of breath. ) 8.5 g 0  . promethazine (PHENERGAN) 25 MG tablet TAKE ONE TABLET BY MOUTH EVERY 6 HOURS AS NEEDED. (Patient taking differently: Take 25 mg by mouth every 6 (six) hours as needed for nausea or vomiting. ) 30 tablet 0  . topiramate (TOPAMAX) 50 MG tablet Take one in the am and 2 at night 90 tablet 2  . valACYclovir (VALTREX) 1000 MG tablet Take 1 bid for 10 days then Take  1 daily for suppression 90 tablet prn  . amphetamine-dextroamphetamine (ADDERALL XR) 30 MG 24 hr capsule TAKE (1) CAPSULE BY MOUTH EVERY DAY. 30 capsule 0  . cloNIDine (CATAPRES) 0.2 MG tablet Take 1 tablet (0.2 mg total) by  mouth 2 (two) times daily. 60 tablet 2  . HYDROcodone Bitartrate ER (HYSINGLA ER) 30 MG T24A Take 30 mg by mouth at bedtime.     . solifenacin (VESICARE) 5 MG tablet Take 1 tablet (5 mg total) by mouth daily. (Patient not taking: Reported on 02/09/2019) 30 tablet 5   No current facility-administered medications for this visit.      Musculoskeletal: Strength & Muscle Tone: within normal limits Gait & Station: normal Patient leans: N/A  Psychiatric Specialty Exam: Review of Systems  Musculoskeletal: Positive for back pain.  Psychiatric/Behavioral: The patient has insomnia.   All other systems reviewed and are negative.   Blood pressure 120/81, pulse 96, height 5' 6.5" (1.689 m), weight 185 lb (83.9 kg).Body mass index is 29.41 kg/m.  General Appearance: Casual and Fairly Groomed  Eye Contact:  Good  Speech:  Clear and Coherent  Volume:  Normal  Mood:  Euthymic  Affect:  Appropriate and Congruent  Thought Process:  Goal Directed  Orientation:  Full (Time, Place, and Person)  Thought Content: WDL   Suicidal Thoughts:  no  Homicidal Thoughts:  No  Memory:  Immediate;   Good Recent;   Good Remote;   Good  Judgement:  Fair  Insight:  Shallow  Psychomotor Activity:  Normal  Concentration:  Concentration: Poor and Attention Span: Poor  Recall:  Good  Fund of Knowledge: Good  Language: Good  Akathisia:  No  Handed:  Right  AIMS (if indicated): not done  Assets:  Communication Skills Desire for Improvement Resilience Social Support Talents/Skills  ADL's:  Intact  Cognition: WNL  Sleep:  Poor   Screenings: AUDIT     ED to Hosp-Admission (Discharged) from 05/09/2015 in [facility]  Alcohol Use Disorder Identification Test Final Score (AUDIT)  0    GAD-7     Counselor from 12/11/2018 in [facility] Counselor from 02/07/2017 in [facility]  Total GAD-7 Score  19  17     PHQ2-9     Counselor from 12/11/2018 in [facility] Counselor from 07/22/2017 in [facility] Counselor from 02/07/2017 in [facility] Office Visit from 01/10/2017 in [facility] Counselor from 10/06/2016 in [facility]  PHQ-2 Total Score  '6  3  2  3  4  '$ PHQ-9 Total Score  '22  15  14  10  18       '$ Assessment and Plan: This patient is a 33 year old female with a history of mood instability most likely congruent with borderline personality disorder and depression.  She also has a history of polysubstance abuse primarily narcotics.  She is doing well with this now.  She will continue Vraylar 1.5 mg daily for mood stabilization along with Tegretol 400 mg daily also for mood stabilization, Topamax 50 mg every morning and 100 mg nightly for mood stabilization.  We will increase clonidine to 0.2 mg at bedtime for sleep and she will restart Adderall XR 30 mg every morning for focus.  She will return to see me in 4 weeks   [name], MD 02/09/2019, 9:52 AM

## 2019-02-13 ENCOUNTER — Ambulatory Visit (HOSPITAL_COMMUNITY): Payer: BLUE CROSS/BLUE SHIELD | Admitting: Licensed Clinical Social Worker

## 2019-02-20 ENCOUNTER — Other Ambulatory Visit: Payer: Self-pay | Admitting: Adult Health

## 2019-03-05 ENCOUNTER — Telehealth (HOSPITAL_COMMUNITY): Payer: Self-pay | Admitting: *Deleted

## 2019-03-05 ENCOUNTER — Other Ambulatory Visit (HOSPITAL_COMMUNITY): Payer: Self-pay | Admitting: Psychiatry

## 2019-03-05 NOTE — Telephone Encounter (Signed)
Per Dr Harrington Challenger LVM Adderall refill not due until 4/6. Clonidine is already at max dose

## 2019-03-05 NOTE — Telephone Encounter (Signed)
Adderall refill not due until 4/6. Clonidine is already at max dose

## 2019-03-05 NOTE — Telephone Encounter (Signed)
Dr Harrington Challenger Patient called LVM requesting refill on Adderall & stated with the pandemic concerns she asked if her Clonidine could be increased to 0.3 mg? Last visit 02-09-2019

## 2019-03-10 ENCOUNTER — Other Ambulatory Visit (HOSPITAL_COMMUNITY): Payer: Self-pay | Admitting: Psychiatry

## 2019-03-15 ENCOUNTER — Telehealth (HOSPITAL_COMMUNITY): Payer: Self-pay | Admitting: *Deleted

## 2019-03-15 ENCOUNTER — Other Ambulatory Visit (HOSPITAL_COMMUNITY): Payer: Self-pay | Admitting: Psychiatry

## 2019-03-15 MED ORDER — AMPHETAMINE-DEXTROAMPHET ER 30 MG PO CP24: ORAL_CAPSULE | ORAL | 0 refills | Status: DC

## 2019-03-15 NOTE — Telephone Encounter (Signed)
Patient called to get refill on Adderall Next appointment 03-17-2019

## 2019-03-15 NOTE — Telephone Encounter (Signed)
Dr Harrington Challenger Patient called requesting refill on   amphetamine-dextroamphetamine (ADDERALL XR) 30 MG 24 hr capsule 30 capsule   Next appointment is 03-27-2019

## 2019-03-15 NOTE — Telephone Encounter (Signed)
sent 

## 2019-03-27 ENCOUNTER — Other Ambulatory Visit: Payer: Self-pay

## 2019-03-27 ENCOUNTER — Encounter (HOSPITAL_COMMUNITY): Payer: Self-pay | Admitting: Psychiatry

## 2019-03-27 ENCOUNTER — Ambulatory Visit (INDEPENDENT_AMBULATORY_CARE_PROVIDER_SITE_OTHER): Payer: BLUE CROSS/BLUE SHIELD | Admitting: Psychiatry

## 2019-03-27 DIAGNOSIS — F332 Major depressive disorder, recurrent severe without psychotic features: Secondary | ICD-10-CM | POA: Diagnosis not present

## 2019-03-27 DIAGNOSIS — F411 Generalized anxiety disorder: Secondary | ICD-10-CM

## 2019-03-27 MED ORDER — DULOXETINE HCL 60 MG PO CPEP: 60.0000 mg | ORAL_CAPSULE | Freq: Every day | ORAL | 2 refills | Status: DC

## 2019-03-27 MED ORDER — AMPHETAMINE-DEXTROAMPHET ER 30 MG PO CP24: ORAL_CAPSULE | ORAL | 0 refills | Status: DC

## 2019-03-27 MED ORDER — CARBAMAZEPINE 200 MG PO TABS: 400.0000 mg | ORAL_TABLET | Freq: Every day | ORAL | 2 refills | Status: DC

## 2019-03-27 MED ORDER — CARIPRAZINE HCL 1.5 MG PO CAPS: 1.5000 mg | ORAL_CAPSULE | Freq: Every day | ORAL | 2 refills | Status: DC

## 2019-03-27 MED ORDER — CLONAZEPAM 0.5 MG PO TABS: 0.5000 mg | ORAL_TABLET | Freq: Three times a day (TID) | ORAL | 1 refills | Status: DC | PRN

## 2019-03-27 MED ORDER — MIRTAZAPINE 30 MG PO TABS: 30.0000 mg | ORAL_TABLET | Freq: Every day | ORAL | 2 refills | Status: DC

## 2019-03-27 MED ORDER — TOPIRAMATE 50 MG PO TABS: ORAL_TABLET | ORAL | 2 refills | Status: DC

## 2019-03-27 NOTE — Progress Notes (Signed)
[facility] MD/PA/NP OP Progress Note  03/27/2019 11:38 AM Penny [name]  MRN:  [id number]  Chief Complaint:  Chief Complaint    Depression; Anxiety; Follow-up     HPI: Virtual Visit via Video Note  I connected with Penny [name] on 03/27/19 at 11:20 AM EDT by a video enabled telemedicine application and verified that I am speaking with the correct person using two identifiers.   I discussed the limitations of evaluation and management by telemedicine and the availability of in person appointments. The patient expressed understanding and agreed to proceed.      I discussed the assessment and treatment plan with the patient. The patient was provided an opportunity to ask questions and all were answered. The patient agreed with the plan and demonstrated an understanding of the instructions.   The patient was advised to call back or seek an in-person evaluation if the symptoms worsen or if the condition fails to improve as anticipated.  I provided 15 minutes of non-face-to-face time during this encounter.   [name], MD This patient is a 33 year old divorced white female who lives with her boyfriend 61-year-old daughter and her boyfriend's 2 daughters ages 86 and 24 in [place].  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  The patient is seen after about 6 weeks for follow-up regarding depression severe mood swings anxiety and ADHD.  She states that she is not doing well.  Her boyfriend's 2 daughters are staying with her mother and were presumed to have gotten exposed to coronavirus.  He now has custody of them and all of them in the household have to be on quarantine for 14 days.  This means that she cannot have her 53 year old son come to visit for 14 days and she is very upset about this.  She is anxious tearful and unable to sleep.  She is also having significant back pain.  She is tearful today and seems very upset.  I tried to get her  to think of things that might soothe her like music writing reading etc.  She states that nothing is helping.  She is spending time with the children in the home doing schoolwork.  She is getting outdoors.  She states that she often feels nauseous from the back pain and currently is on no pain medication.  She is waiting to get another MRI.  In the past the patient has overused pain medicine and sometimes benzodiazepines but I did elect to give her a low-dose of clonazepam to use during this difficult time.  She promises she will not take more than prescribed.  We will also time of mirtazapine for sleep as the clonidine has not been helping.  She does think the Vraylar has helped her mood swings  Visit Diagnosis:    ICD-10-CM   1. Major depressive disorder, recurrent episode, 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[place]) F33.2   2. GAD (generalized anxiety disorder) F41.1     Past Psychiatric History: Previous psychiatric hospitalization last year  Past Medical History:  Past Medical History:  Diagnosis Date  . ADD (attention deficit disorder)   . Anemia 2011   2o to GASTRIC BYPASS  . Anginal pain ([place])   . Anxiety   . Blood transfusion without reported diagnosis   . Chronic daily headache   . Depression   . Depression   . Dysmenorrhea 09/18/2013  . Dysrhythmia   . Elevated liver enzymes JUL 2011ALK  PHOS 111-127 AST  143-267 ALT  213-321T BILI 0.6  ALB  3.7-4.06 Jun 2011 ALK PHOS 118 AST 24 ALT 42 T BILI 0.4 ALB 3.9  . Encounter for drug rehabilitation    at behavioral health for opioid addiction about 4 years ago  . Family history of adverse reaction to anesthesia    'dad had to be kept on pump for breathing for morphine'  . Fatty liver   . Fibroid 01/18/2017  . Fibromyalgia   . Gastric bypass status for obesity   . Gastritis JULY 2011  . Heart murmur   . 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 History of Holter monitoring   . Hx of opioid abuse ([place])    for about  4 years, about 4 years ago  . Interstitial cystitis   . Iron deficiency anemia 07/23/2010  . Irritable bowel syndrome 2012 DIARRHEA   JUN 2012 TTG IgA 14.9  . IUD FEB 2010  . Lupus ([place])   . Menorrhagia 07/18/2013  . Migraines   . Obesity (BMI 30-39.9) 2011 228 LBS BMI 36.8  . Ovarian cyst   . Patient desires pregnancy 09/18/2013  . Polyneuropathy   . PONV (postoperative nausea and vomiting) seizure post-operatively   seizure following ankle surgery  . Potassium (K) deficiency   . Pregnant 12/25/2013  . Psychiatric pseudoseizure   . RLQ abdominal pain 07/18/2013  . Sciatica of left side 11/14/2014  . 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 Stress 09/18/2013    Past Surgical History:  Procedure Laterality Date  . BIOPSY  04/10/2015   Procedure: BIOPSY;  Surgeon: Daneil Dolin, MD;  Location: [facility];  Service: Endoscopy;;  . cath self every nite     for sodium bicarb injection (discontinued 2013)  . CHOLECYSTECTOMY  2005   biliary dyskinesia  . COLONOSCOPY  JUN 2012 ABD PN/DIARRHEA WITH PROPOFOL   NL COLON  . DILATION AND CURETTAGE OF UTERUS    . DILITATION & CURRETTAGE/HYSTROSCOPY WITH NOVASURE ABLATION N/A 03/24/2017   Procedure: DILATATION & CURETTAGE/HYSTEROSCOPY WITH NOVASURE ENDOMETRIAL ABLATION;  Surgeon: Jonnie Kind, MD;  Location: [facility];  Service: Gynecology;  Laterality: N/A;  . ESOPHAGEAL DILATION N/A 04/10/2015   Procedure: ESOPHAGEAL DILATION WITH 54FR MALONEY DILATOR;  Surgeon: Daneil Dolin, MD;  Location: [facility];  Service: Endoscopy;  Laterality: N/A;  . ESOPHAGOGASTRODUODENOSCOPY    . ESOPHAGOGASTRODUODENOSCOPY (EGD) WITH PROPOFOL N/A 04/10/2015   Procedure: ESOPHAGOGASTRODUODENOSCOPY (EGD) WITH PROPOFOL;  Surgeon: Daneil Dolin, MD;  Location: [facility];  Service: Endoscopy;  Laterality: N/A;  . ESOPHAGOGASTRODUODENOSCOPY (EGD) WITH PROPOFOL N/A 12/06/2016   Procedure: ESOPHAGOGASTRODUODENOSCOPY (EGD) WITH PROPOFOL;  Surgeon: Danie Binder, MD;  Location: [facility]  END[facility];  Service: Endoscopy;  Laterality: N/A;  . GAB  2007   in [place]-POUCH 5 CM  . GASTRIC BYPASS  06/2006  . HYSTEROSCOPY W/D&C N/A 09/12/2014   Procedure: DILATATION AND CURETTAGE /HYSTEROSCOPY;  Surgeon: Jonnie Kind, MD;  Location: [facility];  Service: Gynecology;  Laterality: N/A;  . KYPHOPLASTY N/A 08/02/2018   Procedure: KYPHOPLASTY T12;  Surgeon: Melina Schools, MD;  Location: [facility];  Service: Orthopedics;  Laterality: N/A;  60 mins  . LAPAROSCOPIC TUBAL LIGATION Bilateral 03/24/2017   Procedure: LAPAROSCOPIC TUBAL LIGATION ([place] Rings);  Surgeon: Jonnie Kind, MD;  Location: [facility];  Service: Gynecology;  Laterality: Bilateral;  . REPAIR VAGINAL CUFF N/A 07/30/2014   Procedure: REPAIR VAGINAL CUFF;  Surgeon: Mora Bellman,  MD;  Location: [facility];  Service: Gynecology;  Laterality: N/A;  . SAVORY DILATION  06/20/2012   Dr. Barnie Alderman gastritis/Ulcer in the mid jejunum. Empiric dilation.   . SURAL NERVE BX Left 02/25/2016   Procedure: LEFT SURAL NERVE BIOPSY;  Surgeon: Jovita Gamma, MD;  Location: [facility];  Service: Neurosurgery;  Laterality: Left;  Left sural nerve biopsy  . TONSILLECTOMY    . TONSILLECTOMY AND ADENOIDECTOMY    . UPPER GASTROINTESTINAL ENDOSCOPY  JULY 2011 NAUSEA-D125,V6, PH 25   Bx; GASTRITIS, POUCH-5 CM LONG  . WISDOM TOOTH EXTRACTION      Family Psychiatric History: See below  Family History:  Family History  Problem Relation Age of Onset  . Hemochromatosis Maternal Grandmother   . Migraines Maternal Grandmother   . Cancer Maternal Grandmother   . Breast cancer Maternal Grandmother   . Hypertension Father   . Diabetes Father   . Coronary artery disease Father   . Migraines Paternal Grandmother   . Breast cancer Paternal Grandmother   . Cancer Mother        breast  . Hemochromatosis Mother   . Breast cancer Mother   . Depression Mother   . Anxiety disorder Mother   . Coronary artery disease Paternal Grandfather   . Anxiety  disorder Brother   . Bipolar disorder Brother   . Healthy Daughter   . Healthy Son     Social History:  Social History   Socioeconomic History  . Marital status: Divorced    Spouse name: Not on file  . Number of children: 2  . Years of education: some colle  . Highest education level: Not on file  Occupational History    Employer: NOT EMPLOYED  Social Needs  . Financial resource strain: Very hard  . Food insecurity:    Worry: Never true    Inability: Never true  . Transportation needs:    Medical: No    Non-medical: No  Tobacco Use  . Smoking status: Current Every Day Smoker    Packs/day: 0.25    Years: 6.00    Pack years: 1.50    Types: E-cigarettes    Start date: 05/2018  . Smokeless tobacco: Never Used  Substance and Sexual Activity  . Alcohol use: Yes    Alcohol/week: 0.0 standard drinks    Comment: 1-2 beers a couple times a month   . Drug use: Not Currently    Types: Marijuana    Comment: Previously opioid addiction went through rehab  . Sexual activity: Yes    Partners: Male    Birth control/protection: Surgical, Condom    Comment: tubal and ablation  Lifestyle  . Physical activity:    Days per week: 0 days    Minutes per session: 0 min  . Stress: Very much  Relationships  . Social connections:    Talks on phone: More than three times a week    Gets together: More than three times a week    Attends religious service: More than 4 times per year    Active member of club or organization: No    Attends meetings of clubs or organizations: Never    Relationship status: Divorced  Other Topics Concern  . Not on file  Social History Narrative   Patient is right handed.   Patient drinks one cup of coffee daily.   She lives in a one-story home with her two children (46 year old son, 24 month old daughter).   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 Gabapentin  Other (See Comments)    Pt states that she was unresponsive after taking this medication, but her vitals remained stable.    . Metoclopramide Hcl Anxiety and Other (See Comments)    Pt states that she felt like she was trapped in a box, and could not get out.  Pt also states that she had temporary loss of movement, weakness, and tingling.    . Tramadol Other (See Comments)    Seizures  . Ativan [Lorazepam] Other (See Comments)    combative  . Latex Itching, Rash and Other (See Comments)    Burns  . Lyrica [Pregabalin] Other (See Comments)    suicidal  . Trazodone And Nefazodone Other (See Comments)    Makes pt "like a zombie"  . Zofran [Ondansetron] Other (See Comments)    Migraines in high doses    . Tapentadol Hcl   . Butrans [Buprenorphine] Rash    Patch caused rash, didn't help with pain  . Nucynta [Tapentadol] Nausea And Vomiting  . Propofol Nausea And Vomiting  . Tape Itching, Rash and Other (See Comments)    Please use paper tape  . Toradol [Ketorolac Tromethamine] Anxiety    Metabolic Disorder Labs: Lab Results  Component Value Date   HGBA1C 5.7 (H) 01/15/2015   Lab Results  Component Value Date   PROLACTIN 80.0 (H) 05/09/2015   Lab Results  Component Value Date   CHOL 95 03/10/2012   TRIG 50 03/10/2012   HDL 38 (L) 03/10/2012   CHOLHDL 2.5 03/10/2012   VLDL 10 03/10/2012   LDLCALC 47 03/10/2012   Lab Results  Component Value Date   TSH 1.780 12/14/2018   TSH 0.977 11/26/2015    Therapeutic Level Labs: No results found for: LITHIUM No results found for: VALPROATE No components found for:  CBMZ  Current Medications: Current Outpatient Medications  Medication Sig Dispense Refill  . amphetamine-dextroamphetamine (ADDERALL XR) 30 MG 24 hr capsule TAKE (1) CAPSULE BY MOUTH EVERY DAY. 30 capsule 0  . carbamazepine (TEGRETOL) 200 MG tablet Take 2 tablets (400 mg total) by mouth at bedtime. 60 tablet 2  . cariprazine (VRAYLAR) capsule Take 1 capsule (1.5  mg total) by mouth daily. 60 capsule 2  . clonazePAM (KLONOPIN) 0.5 MG tablet Take 1 tablet (0.5 mg total) by mouth 3 (three) times daily as needed for anxiety. 90 tablet 1  . cyclobenzaprine (FLEXERIL) 10 MG tablet TAKE 1 TABLET (10 MG TOTAL) BY MOUTH EVERY 8 (EIGHT) HOURS AS NEEDED FOR MUSCLE SPASMS. 30 tablet 1  . DULoxetine (CYMBALTA) 60 MG capsule Take 1 capsule (60 mg total) by mouth at bedtime. 60 capsule 2  . ketorolac (TORADOL) 10 MG tablet Take 1 tablet (10 mg total) by mouth every 6 (six) hours as needed. 20 tablet 0  . mirtazapine (REMERON) 30 MG tablet Take 1 tablet (30 mg total) by mouth at bedtime. 30 tablet 2  . Multiple Vitamins-Iron (MULTIVITAMIN/IRON PO) Take 2 tablets by mouth daily.    . pantoprazole (PROTONIX) 40 MG tablet Take 1 tablet (40 mg total) by mouth daily. 90 tablet 3  . phenazopyridine (PYRIDIUM) 200 MG tablet Take 200 mg by mouth 3 (three) times daily as needed for pain.    Marland Kitchen PROAIR HFA 108 (90 Base) MCG/ACT inhaler INHALE 2 PUFFS INTO THE LUNGS EVERY 6 HOURS AS  NEEDED. (Patient taking differently: Inhale 2 puffs into the lungs every 6 (six) hours as needed for wheezing or shortness of breath. ) 8.5 g 0  . promethazine (PHENERGAN) 25 MG tablet TAKE ONE TABLET BY MOUTH EVERY 6 HOURS AS NEEDED. (Patient taking differently: Take 25 mg by mouth every 6 (six) hours as needed for nausea or vomiting. ) 30 tablet 0  . solifenacin (VESICARE) 5 MG tablet Take 1 tablet (5 mg total) by mouth daily. (Patient not taking: Reported on 02/09/2019) 30 tablet 5  . topiramate (TOPAMAX) 50 MG tablet Take one in the am and 2 at night 90 tablet 2  . valACYclovir (VALTREX) 1000 MG tablet Take 1 bid for 10 days then Take  1 daily for suppression 90 tablet prn   No current facility-administered medications for this visit.      Musculoskeletal: Strength & Muscle Tone: decreased Gait & Station: normal Patient leans: N/A  Psychiatric Specialty Exam: Review of Systems  Musculoskeletal:  Positive for back pain.  Psychiatric/Behavioral: The patient is nervous/anxious and has insomnia.   All other systems reviewed and are negative.   There were no vitals taken for this visit.There is no height or weight on file to calculate BMI.  General Appearance: Casual  Eye Contact:  Good  Speech:  Clear and Coherent  Volume:  Normal  Mood:  Anxious  Affect:  Constricted and Tearful  Thought Process:  Goal Directed  Orientation:  Full (Time, Place, and Person)  Thought Content: Rumination   Suicidal Thoughts:  No  Homicidal Thoughts:  No  Memory:  Immediate;   Good Recent;   Good Remote;   Good  Judgement:  Fair  Insight:  Fair  Psychomotor Activity:  Decreased  Concentration:  Concentration: Good and Attention Span: Good  Recall:  Good  Fund of Knowledge: Good  Language: Good  Akathisia:  No  Handed:  Right  AIMS (if indicated): not done  Assets:  Communication Skills Desire for Improvement Resilience Social Support Talents/Skills  ADL's:  Intact  Cognition: WNL  Sleep:  Poor   Screenings: AUDIT     ED to Hosp-Admission (Discharged) from 05/09/2015 in [facility]  Alcohol Use Disorder Identification Test Final Score (AUDIT)  0    GAD-7     Counselor from 12/11/2018 in [facility] Counselor from 02/07/2017 in [facility]  Total GAD-7 Score  19  17    PHQ2-9     Counselor from 12/11/2018 in [facility] Counselor from 07/22/2017 in [facility]or from 02/07/2017 in [facility] Visit from 01/10/2017 in [facility] Counselor from 10/06/2016 in [facility]  PHQ-2 Total Score  '6  3  2  3  4  '$ PHQ-9 Total Score  '22  15  14  10  18       '$ Assessment and Plan:  This patient is a  33 year old female with a history of depression anxiety possible bipolar disorder, ADHD.  She is extremely anxious now because she is on quarantine and is very worried about not seeing her son even though they are texting and talking via Skype.  For the short-term we will add clonazepam 0.5 mg up to 3 times daily for anxiety and mirtazapine 30 mg at bedtime for sleep.  She will continue Adderall XR 30 mg every morning for ADHD, Vraylar 1.5 mg daily for mood  stabilization, Cymbalta 60 mg daily for depression.  She will return to see me in 4 weeks  [name], MD 03/27/2019, 11:38 AM

## 2019-03-29 ENCOUNTER — Telehealth: Payer: Self-pay | Admitting: *Deleted

## 2019-03-29 DIAGNOSIS — R0602 Shortness of breath: Secondary | ICD-10-CM | POA: Diagnosis not present

## 2019-03-29 DIAGNOSIS — R1111 Vomiting without nausea: Secondary | ICD-10-CM | POA: Diagnosis not present

## 2019-03-29 DIAGNOSIS — R197 Diarrhea, unspecified: Secondary | ICD-10-CM | POA: Diagnosis not present

## 2019-03-29 DIAGNOSIS — R0902 Hypoxemia: Secondary | ICD-10-CM | POA: Diagnosis not present

## 2019-03-29 DIAGNOSIS — R569 Unspecified convulsions: Secondary | ICD-10-CM | POA: Diagnosis not present

## 2019-03-29 MED ORDER — GENERIC EXTERNAL MEDICATION
Status: DC
Start: ? — End: 2019-03-29

## 2019-03-29 MED ORDER — SODIUM CHLORIDE 0.9 % IV SOLN
10.00 | INTRAVENOUS | Status: DC
Start: ? — End: 2019-03-29

## 2019-03-29 NOTE — Telephone Encounter (Signed)
Pt called having vomiting and sob that started yesterday. Sob is constant and worse with activity. Also having bodyaches. Pt advised to go to ED. She verbalized understanding and states she does have someone to take her to ED.

## 2019-04-10 ENCOUNTER — Other Ambulatory Visit: Payer: Self-pay | Admitting: Family Medicine

## 2019-04-10 NOTE — Telephone Encounter (Signed)
I recommend Cefzil 500 mg 1 twice daily for 5 days Follow-up if ongoing troubles

## 2019-04-10 NOTE — Telephone Encounter (Signed)
Patient states she is still having a little sx of UTI

## 2019-04-10 NOTE — Telephone Encounter (Addendum)
Per ER discharge summary: Patient was given Prednisone 20mg  2 tablets a day for 5 days on 04/08/19 and Pepcid 20mg  one daily for 5 days  Left message to return call to ask patient if she is still having symptoms of UTI

## 2019-04-10 NOTE — Telephone Encounter (Signed)
1.  Is patient still taking prednisone?  If so how much and for how long? 2.  Does she still feel like she is having UTI symptoms?

## 2019-04-10 NOTE — Telephone Encounter (Signed)
Pt called stating that she is still having an allergic reaction from ABT given for her UTI. Pt went to ER on 03/29/2019 and had UTI. Pt states she was give Bactrim but had allergic reaction so she had to go back to ER and they told her to stop Bactrim and gave her Prednisone. Pt states she was unable to finish course of abts for UTI. Still having itching on arms with right arm being worse than left arm. No shortness of breath, no vomiting, no trouble breathing, no seizures. Pt is unsure of what to do from here. Pt states she did have itching on legs but that has became better. Please advise. Thank you.

## 2019-04-10 NOTE — Addendum Note (Signed)
Addended by: Dairl Ponder on: 04/10/2019 04:08 PM   Modules accepted: Orders

## 2019-04-10 NOTE — Telephone Encounter (Addendum)
Left message to return call to verify pharmacy and notify patient. (medication is pended)

## 2019-04-11 ENCOUNTER — Other Ambulatory Visit: Payer: Self-pay | Admitting: *Deleted

## 2019-04-11 MED ORDER — CEFPROZIL 500 MG PO TABS: 500.0000 mg | ORAL_TABLET | Freq: Two times a day (BID) | ORAL | 0 refills | Status: DC

## 2019-04-11 MED ORDER — PHENAZOPYRIDINE HCL 200 MG PO TABS: 200.0000 mg | ORAL_TABLET | Freq: Three times a day (TID) | ORAL | 0 refills | Status: DC | PRN

## 2019-04-11 NOTE — Addendum Note (Signed)
Addended by: Carmelina Noun on: 04/11/2019 11:17 AM   Modules accepted: Orders

## 2019-04-11 NOTE — Telephone Encounter (Signed)
Pt [facility] to use [facility] in [place]. Antibiotic sent to pharm and pt also requesting rx for pyridium.

## 2019-04-11 NOTE — Telephone Encounter (Signed)
Left message to return call 

## 2019-04-11 NOTE — Telephone Encounter (Signed)
sUre 200mg  15 one p o tid

## 2019-04-11 NOTE — Telephone Encounter (Signed)
Med sent to pharm. Pt notified.  

## 2019-04-17 ENCOUNTER — Other Ambulatory Visit: Payer: Self-pay

## 2019-04-17 ENCOUNTER — Ambulatory Visit (INDEPENDENT_AMBULATORY_CARE_PROVIDER_SITE_OTHER): Payer: Medicaid Other | Admitting: Family Medicine

## 2019-04-17 DIAGNOSIS — D509 Iron deficiency anemia, unspecified: Secondary | ICD-10-CM | POA: Diagnosis not present

## 2019-04-17 DIAGNOSIS — R55 Syncope and collapse: Secondary | ICD-10-CM

## 2019-04-17 NOTE — Progress Notes (Signed)
   Subjective:    Patient ID: Penny [name], female    DOB: 1986-01-25, 33 y.o.   MRN: [id number]  HPIpassed out over the weekend and again last night. Hit head last night when she passed out. Had been dizzy and seeing black spots for about 3 weeks before passing out. Resting heart rate has been over 120 for the past few weeks.  This patient relates significant problems with orthostasis passing out fast heart rate denies any chest tightness pressure pain denies wheezing difficulty breathing states she feels frustrated by what is going on she is passed out several different times she denies being any seizure condition causing this she does not feel it is due to stress She has had to go to the ER several different times because of this Virtual Visit via Video Note  I connected with Penny [name] on 04/17/19 at  1:10 PM EDT by a video enabled telemedicine application and verified that I am speaking with the correct person using two identifiers.  Location: Patient: home Provider: office   I discussed the limitations of evaluation and management by telemedicine and the availability of in person appointments. The patient expressed understanding and agreed to proceed.  History of Present Illness:    Observations/Objective:   Assessment and Plan:   Follow Up Instructions:    I discussed the assessment and treatment plan with the patient. The patient was provided an opportunity to ask questions and all were answered. The patient agreed with the plan and demonstrated an understanding of the instructions.   The patient was advised to call back or seek an in-person evaluation if the symptoms worsen or if the condition fails to improve as anticipated.  I provided 15 minutes of non-face-to-face time during this encounter.       Review of Systems  Constitutional: Negative for activity change, appetite change, fatigue and fever.  HENT: Negative for congestion and rhinorrhea.    Respiratory: Negative for cough and shortness of breath.   Cardiovascular: Negative for chest pain and leg swelling.  Gastrointestinal: Negative for abdominal pain, nausea and vomiting.  Skin: Negative for color change.  Neurological: Positive for syncope. Negative for dizziness, seizures, weakness and headaches.  Psychiatric/Behavioral: Negative for agitation and confusion.       Objective:   Physical Exam   Unabl to do physical exam via video patient did not appear to be in distress but she did appear palee in appeared to feel weak     Assessment & Plan:  She does have a history of iron deficient anemia we need to check her ferritin and CBC in addition to this I would check other labs including kidney function  She describes orthostasis it does raise into question if she may be developing underlying condition related into Addison's disease I think it is reasonable to get have her be seen by a local endocrinology for further evaluation  She will do a follow-up in the office in a couple weeks time so we can check orthostatic blood pressures

## 2019-04-17 NOTE — Progress Notes (Signed)
Referral placed.

## 2019-04-17 NOTE — Addendum Note (Signed)
Addended by: Vicente Males on: 04/17/2019 02:54 PM   Modules accepted: Orders

## 2019-04-18 ENCOUNTER — Other Ambulatory Visit: Payer: Self-pay | Admitting: Family Medicine

## 2019-04-18 ENCOUNTER — Other Ambulatory Visit (HOSPITAL_COMMUNITY): Payer: Self-pay | Admitting: Psychiatry

## 2019-04-18 MED ORDER — MIRTAZAPINE 30 MG PO TABS: 30.0000 mg | ORAL_TABLET | Freq: Every day | ORAL | 2 refills | Status: DC

## 2019-04-18 NOTE — Telephone Encounter (Signed)
As for this specific prescription it is under the control of Dr. Harrington Challenger

## 2019-04-18 NOTE — Telephone Encounter (Signed)
Pt contacted office. Pt states that she is passing out more, being dizzy, having headaches, neck pain and seeing black spots. Pt states that she had blood work this morning. Pt states that she would like something sent in for neck pain and headache. Pt is not having any shortness of breath or trouble breathing. Pt advised that per Dr.Scott that if she continues to be passing out more then she would need to go to ER. Pt verbalized understanding. Pt states that the headache and neck pain are what makes today different than yesterday.

## 2019-04-18 NOTE — Telephone Encounter (Signed)
Left message to return call 

## 2019-04-18 NOTE — Telephone Encounter (Signed)
It is hard to know for certain what is causing her headache and neck pain.  Should she start running fevers or have more passing out she needs to go to the ER  I can send in a short prescription of hydrocodone for her to take to help with the pain otherwise I would not recommend any other medications at this time  Please find out from the patient what she would like to do

## 2019-04-19 LAB — BASIC METABOLIC PANEL
BUN/Creatinine Ratio: 26 — ABNORMAL HIGH (ref 9–23)
BUN: 19 mg/dL (ref 6–20)
CO2: 19 mmol/L — ABNORMAL LOW (ref 20–29)
Calcium: 9.2 mg/dL (ref 8.7–10.2)
Chloride: 106 mmol/L (ref 96–106)
Creatinine, Ser: 0.74 mg/dL (ref 0.57–1.00)
GFR calc Af Amer: 125 mL/min/{1.73_m2} (ref >59)
GFR calc non Af Amer: 108 mL/min/{1.73_m2} (ref >59)
Glucose: 98 mg/dL (ref 65–99)
Potassium: 4.3 mmol/L (ref 3.5–5.2)
Sodium: 138 mmol/L (ref 134–144)

## 2019-04-19 LAB — CBC WITH DIFFERENTIAL/PLATELET
Basophils Absolute: 0 10*3/uL (ref 0.0–0.2)
Basos: 1 %
EOS (ABSOLUTE): 0.4 10*3/uL (ref 0.0–0.4)
Eos: 7 %
Hematocrit: 33.6 % — ABNORMAL LOW (ref 34.0–46.6)
Hemoglobin: 10.7 g/dL — ABNORMAL LOW (ref 11.1–15.9)
Immature Grans (Abs): 0 10*3/uL (ref 0.0–0.1)
Immature Granulocytes: 0 %
Lymphocytes Absolute: 2.1 10*3/uL (ref 0.7–3.1)
Lymphs: 40 %
MCH: 27.7 pg (ref 26.6–33.0)
MCHC: 31.8 g/dL (ref 31.5–35.7)
MCV: 87 fL (ref 79–97)
Monocytes Absolute: 0.5 10*3/uL (ref 0.1–0.9)
Monocytes: 9 %
Neutrophils Absolute: 2.3 10*3/uL (ref 1.4–7.0)
Neutrophils: 43 %
Platelets: 362 10*3/uL (ref 150–450)
RBC: 3.86 x10E6/uL (ref 3.77–5.28)
RDW: 15.9 % — ABNORMAL HIGH (ref 11.7–15.4)
WBC: 5.3 10*3/uL (ref 3.4–10.8)

## 2019-04-19 LAB — FERRITIN: Ferritin: 6 ng/mL — ABNORMAL LOW (ref 15–150)

## 2019-04-19 LAB — CORTISOL: Cortisol: 11.1 ug/dL

## 2019-04-19 MED ORDER — HYDROCODONE-ACETAMINOPHEN 5-325 MG PO TABS: 1.0000 | ORAL_TABLET | Freq: Four times a day (QID) | ORAL | 0 refills | Status: DC | PRN

## 2019-04-19 NOTE — Addendum Note (Signed)
Addended by: Mikey Kirschner on: 04/19/2019 03:59 PM   Modules accepted: Orders

## 2019-04-19 NOTE — Telephone Encounter (Addendum)
Patient would like prescription of hydrocodone and the results of her recent labs- telephone call no answer for Dr Nicki Reaper per Dr Richardson Landry

## 2019-04-19 NOTE — Addendum Note (Signed)
Addended by: Dairl Ponder on: 04/19/2019 03:46 PM   Modules accepted: Orders

## 2019-04-19 NOTE — Telephone Encounter (Signed)
Medication pended- Patient advised that Dr Nicki Reaper will review labs upon his return to office Monday-Patient verbalized understanding

## 2019-04-20 NOTE — Addendum Note (Signed)
Addended by: Dairl Ponder on: 04/20/2019 04:46 PM   Modules accepted: Orders

## 2019-04-21 ENCOUNTER — Emergency Department (HOSPITAL_COMMUNITY): Payer: Medicaid Other

## 2019-04-21 ENCOUNTER — Encounter (HOSPITAL_COMMUNITY): Payer: Self-pay | Admitting: Emergency Medicine

## 2019-04-21 ENCOUNTER — Other Ambulatory Visit: Payer: Self-pay

## 2019-04-21 ENCOUNTER — Emergency Department (HOSPITAL_COMMUNITY)
Admission: EM | Admit: 2019-04-21 | Discharge: 2019-04-21 | Disposition: A | Discharge status: Home / Self Care | Payer: Medicaid Other | Attending: Emergency Medicine | Admitting: Emergency Medicine

## 2019-04-21 DIAGNOSIS — R55 Syncope and collapse: Secondary | ICD-10-CM | POA: Diagnosis not present

## 2019-04-21 DIAGNOSIS — R42 Dizziness and giddiness: Secondary | ICD-10-CM | POA: Insufficient documentation

## 2019-04-21 DIAGNOSIS — Z79899 Other long term (current) drug therapy: Secondary | ICD-10-CM | POA: Diagnosis not present

## 2019-04-21 DIAGNOSIS — R531 Weakness: Secondary | ICD-10-CM | POA: Insufficient documentation

## 2019-04-21 DIAGNOSIS — R1084 Generalized abdominal pain: Secondary | ICD-10-CM | POA: Diagnosis not present

## 2019-04-21 DIAGNOSIS — Z91040 Latex allergy status: Secondary | ICD-10-CM | POA: Insufficient documentation

## 2019-04-21 DIAGNOSIS — R0602 Shortness of breath: Secondary | ICD-10-CM | POA: Diagnosis not present

## 2019-04-21 DIAGNOSIS — R0789 Other chest pain: Secondary | ICD-10-CM | POA: Diagnosis not present

## 2019-04-21 DIAGNOSIS — F17200 Nicotine dependence, unspecified, uncomplicated: Secondary | ICD-10-CM | POA: Diagnosis not present

## 2019-04-21 DIAGNOSIS — N390 Urinary tract infection, site not specified: Secondary | ICD-10-CM | POA: Diagnosis not present

## 2019-04-21 DIAGNOSIS — R079 Chest pain, unspecified: Secondary | ICD-10-CM | POA: Diagnosis not present

## 2019-04-21 DIAGNOSIS — Z20828 Contact with and (suspected) exposure to other viral communicable diseases: Secondary | ICD-10-CM | POA: Diagnosis not present

## 2019-04-21 DIAGNOSIS — D649 Anemia, unspecified: Secondary | ICD-10-CM | POA: Diagnosis not present

## 2019-04-21 DIAGNOSIS — S0990XA Unspecified injury of head, initial encounter: Secondary | ICD-10-CM | POA: Diagnosis not present

## 2019-04-21 LAB — COMPREHENSIVE METABOLIC PANEL
ALT: 35 U/L (ref 0–44)
AST: 22 U/L (ref 15–41)
Albumin: 3.5 g/dL (ref 3.5–5.0)
Alkaline Phosphatase: 65 U/L (ref 38–126)
Anion gap: 11 (ref 5–15)
BUN: 13 mg/dL (ref 6–20)
CO2: 18 mmol/L — ABNORMAL LOW (ref 22–32)
Calcium: 8.5 mg/dL — ABNORMAL LOW (ref 8.9–10.3)
Chloride: 113 mmol/L — ABNORMAL HIGH (ref 98–111)
Creatinine, Ser: 0.76 mg/dL (ref 0.44–1.00)
GFR calc Af Amer: >60 mL/min (ref >60)
GFR calc non Af Amer: >60 mL/min (ref >60)
Glucose, Bld: 88 mg/dL (ref 70–99)
Potassium: 4 mmol/L (ref 3.5–5.1)
Sodium: 142 mmol/L (ref 135–145)
Total Bilirubin: 0.4 mg/dL (ref 0.3–1.2)
Total Protein: 5.8 g/dL — ABNORMAL LOW (ref 6.5–8.1)

## 2019-04-21 LAB — URINALYSIS, ROUTINE W REFLEX MICROSCOPIC
Bacteria, UA: NONE SEEN
Bilirubin Urine: NEGATIVE
Glucose, UA: NEGATIVE mg/dL
Hgb urine dipstick: NEGATIVE
Ketones, ur: NEGATIVE mg/dL
Nitrite: NEGATIVE
Protein, ur: NEGATIVE mg/dL
Specific Gravity, Urine: 1.023 (ref 1.005–1.030)
pH: 7 (ref 5.0–8.0)

## 2019-04-21 LAB — CBC WITH DIFFERENTIAL/PLATELET
Abs Immature Granulocytes: 0.02 10*3/uL (ref 0.00–0.07)
Basophils Absolute: 0 10*3/uL (ref 0.0–0.1)
Basophils Relative: 1 %
Eosinophils Absolute: 0.2 10*3/uL (ref 0.0–0.5)
Eosinophils Relative: 3 %
HCT: 32.5 % — ABNORMAL LOW (ref 36.0–46.0)
Hemoglobin: 10 g/dL — ABNORMAL LOW (ref 12.0–15.0)
Immature Granulocytes: 0 %
Lymphocytes Relative: 24 %
Lymphs Abs: 1.9 10*3/uL (ref 0.7–4.0)
MCH: 27.3 pg (ref 26.0–34.0)
MCHC: 30.8 g/dL (ref 30.0–36.0)
MCV: 88.8 fL (ref 80.0–100.0)
Monocytes Absolute: 0.7 10*3/uL (ref 0.1–1.0)
Monocytes Relative: 9 %
Neutro Abs: 5.1 10*3/uL (ref 1.7–7.7)
Neutrophils Relative %: 63 %
Platelets: 319 10*3/uL (ref 150–400)
RBC: 3.66 MIL/uL — ABNORMAL LOW (ref 3.87–5.11)
RDW: 15.6 % — ABNORMAL HIGH (ref 11.5–15.5)
WBC: 8 10*3/uL (ref 4.0–10.5)
nRBC: 0 % (ref 0.0–0.2)

## 2019-04-21 LAB — D-DIMER, QUANTITATIVE: D-Dimer, Quant: <0.27 ug/mL-FEU (ref 0.00–0.50)

## 2019-04-21 LAB — SARS CORONAVIRUS 2 BY RT PCR (HOSPITAL ORDER, PERFORMED IN ~~LOC~~ HOSPITAL LAB): SARS Coronavirus 2: NEGATIVE

## 2019-04-21 LAB — TROPONIN I: Troponin I: <0.03 ng/mL (ref <0.03)

## 2019-04-21 MED ORDER — Medication
Status: DC
Start: ? — End: 2019-04-21

## 2019-04-21 MED ORDER — FENTANYL CITRATE (PF) 100 MCG/2ML IJ SOLN
25.0000 ug | Freq: Once | INTRAMUSCULAR | Status: AC
  Administered 2019-04-21: 19:00:00 25 ug via INTRAVENOUS
  Filled 2019-04-21: qty 2

## 2019-04-21 MED ORDER — IOHEXOL 350 MG/ML SOLN
100.0000 mL | Freq: Once | INTRAVENOUS | Status: AC | PRN
  Administered 2019-04-21: 100 mL via INTRAVENOUS

## 2019-04-21 MED ORDER — SODIUM CHLORIDE 0.9 % IV BOLUS
500.0000 mL | Freq: Once | INTRAVENOUS | Status: AC
  Administered 2019-04-21: 500 mL via INTRAVENOUS

## 2019-04-21 MED ORDER — FENTANYL CITRATE (PF) 100 MCG/2ML IJ SOLN
25.0000 ug | Freq: Once | INTRAMUSCULAR | Status: AC
  Administered 2019-04-21: 25 ug via INTRAVENOUS
  Filled 2019-04-21: qty 2

## 2019-04-21 MED ORDER — HYDROCODONE-ACETAMINOPHEN 5-325 MG PO TABS: 1.0000 | ORAL_TABLET | ORAL | 0 refills | Status: AC | PRN

## 2019-04-21 MED ORDER — BARO-CAT PO
10.00 | ORAL | Status: DC
Start: ? — End: 2019-04-21

## 2019-04-21 MED ORDER — HYDROCODONE-ACETAMINOPHEN 10-325 MG PO TABS
1.0000 | ORAL_TABLET | Freq: Once | ORAL | Status: AC
  Administered 2019-04-21: 1 via ORAL
  Filled 2019-04-21: qty 1

## 2019-04-21 MED ORDER — SODIUM CHLORIDE 0.9 % IV SOLN
10.00 | INTRAVENOUS | Status: DC
Start: ? — End: 2019-04-21

## 2019-04-21 NOTE — Discharge Instructions (Addendum)
Your hemoglobin today was 10, the rest of your laboratory results were within normal limits.  Your CT dissection was negative.  Please follow-up with your primary care physician as needed.  Please remember to purchase some MiraLAX, you may take 1 capful on an 8 ounce glass of water, to help with your likely constipation.

## 2019-04-21 NOTE — ED Notes (Signed)
Pt given ice chips

## 2019-04-21 NOTE — ED Notes (Addendum)
PA Soto aware of pt reporting worsening CP, per PA CT study to be changed to look for dissection, PA will assess safety of giving further IV pain medicine once patient is back from scans due to pressures in the low 584'K systolic.  Pt updated about this plan and mentating well.  States she will update family herself.

## 2019-04-21 NOTE — ED Notes (Signed)
Patient transported to X-ray 

## 2019-04-21 NOTE — ED Notes (Signed)
Patient transported to CT 

## 2019-04-21 NOTE — ED Notes (Signed)
Discharge instructions and prescription discussed with Pt. Pt verbalized understanding. Pt stable and ambulatory.    

## 2019-04-21 NOTE — ED Provider Notes (Signed)
[place] EMERGENCY DEPARTMENT Provider Note   CSN: [id number] Arrival date & time: 04/21/19  1643    History   Chief Complaint Chief Complaint  Patient presents with   Chest Pain   Shortness of Breath   Dizziness    HPI Penny [name] is a 33 y.o. female.     33 y.o female with a PMH of Anemia presents to the ED with a chief complaint of shortness of breath, chest pain x 1 week. Patient was seen at [facility] ED this morning and was discharge with keflex for a UTI. She reports the chest pain along with shortness of breath have worsen. She reports constant chest pressure in the middle of her chest with radiation to the left side. She also endorses hands and feet tingling sensation. She endorses 4-5 episodes of vomiting in the past week, one of them today non bloody non bilious. She states she has been checking her HR at home which has been elevated in the 100's even while at rest. She reports she has not been out of her house in the past two weeks, currently is not working. Patient reports she has been having her fiance help her get out of bed daily as she does not have enough strength to ambulate. She also endorses some generalized abdominal pain. She denies any fever, chills, diarrhea.      Past Medical History:  Diagnosis Date   ADD (attention deficit disorder)    Anemia 2011   2o to GASTRIC BYPASS   Anginal pain ([place])    Anxiety    Blood transfusion without reported diagnosis    Chronic daily headache    Depression    Depression    Dysmenorrhea 09/18/2013   Dysrhythmia    Elevated liver enzymes JUL 2011ALK PHOS 111-127 AST  143-267 ALT  213-321T BILI 0.6  ALB  3.7-4.06 Jun 2011 ALK PHOS 118 AST 24 ALT 42 T BILI 0.4 ALB 3.9   Encounter for drug rehabilitation    at behavioral health for opioid addiction about 4 years ago   Family history of adverse reaction to anesthesia    'dad had to be kept on pump for breathing for morphine'     Fatty liver    Fibroid 01/18/2017   Fibromyalgia    Gastric bypass status for obesity    Gastritis JULY 2011   Heart murmur    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   History of Holter monitoring    Hx of opioid abuse ([place])    for about 4 years, about 4 years ago   Interstitial cystitis    Iron deficiency anemia 07/23/2010   Irritable bowel syndrome 2012 DIARRHEA   JUN 2012 TTG IgA 14.9   IUD FEB 2010   Lupus ([place])    Menorrhagia 07/18/2013   Migraines    Obesity (BMI 30-39.9) 2011 228 LBS BMI 36.8   Ovarian cyst    Patient desires pregnancy 09/18/2013   Polyneuropathy    PONV (postoperative nausea and vomiting) seizure post-operatively   seizure following ankle surgery   Potassium (K) deficiency    Pregnant 12/25/2013   Psychiatric pseudoseizure    RLQ abdominal pain 07/18/2013   Sciatica of left side 11/14/2014   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   Stress 09/18/2013    Patient Active Problem List   Diagnosis Date Noted   Urge incontinence 10/26/2018   S/P kyphoplasty 08/02/2018   Herpes genitalis in women 12/19/2017  Abnormal urine odor 03/30/2017   Urinary tract infection with hematuria 03/30/2017   Screening for genitourinary condition 03/30/2017   Pain 03/30/2017   Burning with urination 03/30/2017   Family history of breast cancer in mother,uncertain BR CA status 02/28/2017   Fibroid 01/18/2017   Chronic pain syndrome 12/05/2016   Folate deficiency 12/04/2016   Nausea vomiting and diarrhea 12/04/2016   Severe anemia 12/04/2016   Symptomatic anemia 12/03/2016   Complaints of leg weakness    Paresthesia    Left-sided weakness    Uncontrolled pain 01/24/2016   Malnutrition of moderate degree 01/24/2016   Neuropathy 01/23/2016   Inability to walk 01/23/2016   Paresthesias    Bilateral leg numbness 11/26/2015   Major depressive disorder, recurrent episode, 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[place]) 05/10/2015   Post partum depression 05/09/2015   Anastomotic ulcer    Dysphagia    Hematemesis 04/07/2015   Intractable vomiting with nausea 04/07/2015   Elevated liver enzymes    Epigastric pain    Pseudoseizure ([place]) 02/22/2015   Seizure-like activity ([place]) 02/22/2015   Fibromyalgia 02/18/2015   Vulvar fissure 02/18/2015   Clinical depression 02/01/2015   Current smoker 01/29/2015   Current tobacco use 01/29/2015   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   Leg weakness, bilateral 01/02/2015   Gait disorder 01/02/2015   Other symptoms and signs involving the musculoskeletal system 01/02/2015   Abnormal gait 01/02/2015   Cervicalgia 12/18/2014   Sciatica of left side 11/14/2014   Neuralgia neuritis, sciatic nerve 11/14/2014   Pseudoseizures 09/12/2014   Encounter for sterilization 09/09/2014   Abdominal pain, acute, right lower quadrant 08/22/2014   Headache, migraine 08/19/2014   Seizure ([place]) 08/18/2014   Encephalopathy 08/13/2014   Headache 08/13/2014   Altered mental status 08/13/2014   Right leg numbness 07/31/2014   Disturbance of skin sensation 07/31/2014   Hypertension in pregnancy, transient 07/08/2014   Previous gastric bypass affecting pregnancy, antepartum 05/22/2014   Patent foramen ovale with right to left shunt 05/17/2014   ASD (atrial septal defect), ostium secundum 05/17/2014   Depression complicating pregnancy in second trimester, antepartum 05/09/2014   Antepartum mental disorder in pregnancy 05/09/2014   Rapid palpitations 02/18/2014   H/O maternal third degree perineal laceration, currently pregnant 02/18/2014   High-risk pregnancy 02/18/2014   Supervision of pregnancy with other poor reproductive or obstetric history, unspecified trimester 02/18/2014   Restless legs 01/16/2014   Restless leg 01/16/2014   Chronic interstitial cystitis 10/23/2013   Menorrhagia 07/18/2013    Excessive and frequent menstruation 07/18/2013   h/o Opiate addiction 03/11/2012    Class: Acute   History of migraine headaches 03/10/2012    Class: Acute   Depression with anxiety 03/10/2012    Class: Chronic   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 03/10/2012    Class: Chronic   ADD (attention deficit disorder) without hyperactivity 03/10/2012    Class: Chronic   Panic disorder without agoraphobia 03/10/2012   H/O disease 03/10/2012   Dysthymia 03/10/2012   Pelvic congestion syndrome 10/13/2011   Coitalgia 10/13/2011   Chronic migraine without aura 10/13/2011   Unspecified dyspareunia 10/13/2011   IBS (irritable bowel syndrome) 08/25/2011   Diarrhea 05/27/2011   OBESITY, UNSPECIFIED 09/17/2010   Transaminitis 09/17/2010   Anemia, iron deficiency 07/23/2010    Past Surgical History:  Procedure Laterality Date   BIOPSY  04/10/2015   Procedure: BIOPSY;  Surgeon: Daneil Dolin, MD;  Location: [facility];  Service: Endoscopy;;   cath self  every nite     for sodium bicarb injection (discontinued 2013)   CHOLECYSTECTOMY  2005   biliary dyskinesia   COLONOSCOPY  JUN 2012 ABD PN/DIARRHEA WITH PROPOFOL   NL COLON   DILATION AND CURETTAGE OF UTERUS     DILITATION & CURRETTAGE/HYSTROSCOPY WITH NOVASURE ABLATION N/A 03/24/2017   Procedure: DILATATION & CURETTAGE/HYSTEROSCOPY WITH NOVASURE ENDOMETRIAL ABLATION;  Surgeon: Jonnie Kind, MD;  Location: [facility];  Service: Gynecology;  Laterality: N/A;   ESOPHAGEAL DILATION N/A 04/10/2015   Procedure: ESOPHAGEAL DILATION WITH 54FR MALONEY DILATOR;  Surgeon: Daneil Dolin, MD;  Location: [facility];  Service: Endoscopy;  Laterality: N/A;   ESOPHAGOGASTRODUODENOSCOPY     ESOPHAGOGASTRODUODENOSCOPY (EGD) WITH PROPOFOL N/A 04/10/2015   Procedure: ESOPHAGOGASTRODUODENOSCOPY (EGD) WITH PROPOFOL;  Surgeon: Daneil Dolin, MD;  Location: [facility];  Service: Endoscopy;  Laterality: N/A;    ESOPHAGOGASTRODUODENOSCOPY (EGD) WITH PROPOFOL N/A 12/06/2016   Procedure: ESOPHAGOGASTRODUODENOSCOPY (EGD) WITH PROPOFOL;  Surgeon: Danie Binder, MD;  Location: [facility];  Service: Endoscopy;  Laterality: N/A;   GAB  2007   in [place] 5 CM   GASTRIC BYPASS  06/2006   HYSTEROSCOPY W/D&C N/A 09/12/2014   Procedure: DILATATION AND CURETTAGE /HYSTEROSCOPY;  Surgeon: Jonnie Kind, MD;  Location: [facility];  Service: Gynecology;  Laterality: N/A;   KYPHOPLASTY N/A 08/02/2018   Procedure: KYPHOPLASTY T12;  Surgeon: Melina Schools, MD;  Location: [facility];  Service: Orthopedics;  Laterality: N/A;  60 mins   LAPAROSCOPIC TUBAL LIGATION Bilateral 03/24/2017   Procedure: LAPAROSCOPIC TUBAL LIGATION ([place] Rings);  Surgeon: Jonnie Kind, MD;  Location: [facility];  Service: Gynecology;  Laterality: Bilateral;   REPAIR VAGINAL CUFF N/A 07/30/2014   Procedure: REPAIR VAGINAL CUFF;  Surgeon: Mora Bellman, MD;  Location: [facility];  Service: Gynecology;  Laterality: N/A;   SAVORY DILATION  06/20/2012   Dr. Barnie Alderman gastritis/Ulcer in the mid jejunum. Empiric dilation.    SURAL NERVE BX Left 02/25/2016   Procedure: LEFT SURAL NERVE BIOPSY;  Surgeon: Jovita Gamma, MD;  Location: [place] [facility];  Service: Neurosurgery;  Laterality: Left;  Left sural nerve biopsy   TONSILLECTOMY     TONSILLECTOMY AND ADENOIDECTOMY     UPPER GASTROINTESTINAL ENDOSCOPY  JULY 2011 NAUSEA-D125,V6, PH 25   Bx; GASTRITIS, POUCH-5 CM LONG   WISDOM TOOTH EXTRACTION       OB History    Gravida  3   Para  2   Term  1   Preterm  1   AB  1   Living  2     SAB  1   TAB      Ectopic      Multiple      Live Births  2            Home Medications    Prior to Admission medications   Medication Sig Start Date End Date Taking? Authorizing Provider  amphetamine-dextroamphetamine (ADDERALL XR) 30 MG 24 hr capsule TAKE (1) CAPSULE BY MOUTH EVERY DAY. Patient taking differently: Take 30 mg by  mouth daily.  03/27/19   Cloria Spring, MD  carbamazepine (TEGRETOL) 200 MG tablet Take 2 tablets (400 mg total) by mouth at bedtime. 03/27/19   Cloria Spring, MD  cariprazine (VRAYLAR) capsule Take 1 capsule (1.5 mg total) by mouth daily. 03/27/19   Cloria Spring, MD  cefPROZIL (CEFZIL) 500 MG tablet Take 1 tablet (500 mg total) by mouth 2 (two) times daily. Patient not  taking: Reported on 04/17/2019 04/11/19   Kathyrn Drown, MD  clonazePAM (KLONOPIN) 0.5 MG tablet Take 1 tablet (0.5 mg total) by mouth 3 (three) times daily as needed for anxiety. 03/27/19 03/26/20  Cloria Spring, MD  cyclobenzaprine (FLEXERIL) 10 MG tablet TAKE 1 TABLET (10 MG TOTAL) BY MOUTH EVERY 8 (EIGHT) HOURS AS NEEDED FOR MUSCLE SPASMS. 02/21/19   Estill Dooms, NP  DULoxetine (CYMBALTA) 60 MG capsule Take 1 capsule (60 mg total) by mouth at bedtime. 03/27/19   Cloria Spring, MD  HYDROcodone-acetaminophen (NORCO/VICODIN) 5-325 MG tablet Take 1 tablet by mouth every 4 (four) hours as needed for up to 5 days. 04/21/19 04/26/19  Janeece Fitting, PA-C  ketorolac (TORADOL) 10 MG tablet Take 1 tablet (10 mg total) by mouth every 6 (six) hours as needed. Patient not taking: Reported on 04/17/2019 02/01/19   Derrek Monaco A, NP  mirtazapine (REMERON) 30 MG tablet Take 1 tablet (30 mg total) by mouth at bedtime. 04/18/19 04/17/20  Cloria Spring, MD  Multiple Vitamins-Iron (MULTIVITAMIN/IRON PO) Take 2 tablets by mouth daily.    [provider]  pantoprazole (PROTONIX) 40 MG tablet Take 1 tablet (40 mg total) by mouth daily. Patient not taking: Reported on 04/17/2019 11/10/17   Butch Penny, NP  phenazopyridine (PYRIDIUM) 200 MG tablet Take 200 mg by mouth 3 (three) times daily as needed for pain.    [provider]  phenazopyridine (PYRIDIUM) 200 MG tablet Take 1 tablet (200 mg total) by mouth 3 (three) times daily as needed for pain. 04/11/19   Kathyrn Drown, MD  PROAIR HFA 108 (90 Base) MCG/ACT inhaler INHALE  2 PUFFS INTO THE LUNGS EVERY 6 HOURS AS NEEDED. Patient taking differently: Inhale 2 puffs into the lungs every 6 (six) hours as needed for wheezing or shortness of breath.  06/14/17   Kathyrn Drown, MD  promethazine (PHENERGAN) 25 MG tablet TAKE ONE TABLET BY MOUTH EVERY 6 HOURS AS NEEDED. Patient taking differently: Take 25 mg by mouth every 6 (six) hours as needed for nausea or vomiting.  07/03/18   Estill Dooms, NP  solifenacin (VESICARE) 5 MG tablet Take 1 tablet (5 mg total) by mouth daily. Patient not taking: Reported on 04/17/2019 10/26/18   Jonnie Kind, MD  topiramate (TOPAMAX) 50 MG tablet Take one in the am and 2 at night 03/27/19   Cloria Spring, MD  valACYclovir (VALTREX) 1000 MG tablet Take 1 bid for 10 days then Take  1 daily for suppression 02/01/19   Estill Dooms, NP    Family History Family History  Problem Relation Age of Onset   Hemochromatosis Maternal Grandmother    Migraines Maternal Grandmother    Cancer Maternal Grandmother    Breast cancer Maternal Grandmother    Hypertension Father    Diabetes Father    Coronary artery disease Father    Migraines Paternal Grandmother    Breast cancer Paternal Grandmother    Cancer Mother        breast   Hemochromatosis Mother    Breast cancer Mother    Depression Mother    Anxiety disorder Mother    Coronary artery disease Paternal Grandfather    Anxiety disorder Brother    Bipolar disorder Brother    Healthy Daughter    Healthy Son     Social History Social History   Tobacco Use   Smoking status: Current Every Day Smoker    Packs/day: 0.25  Years: 6.00    Pack years: 1.50    Types: E-cigarettes    Start date: 05/2018   Smokeless tobacco: Never Used  Substance Use Topics   Alcohol use: Yes    Alcohol/week: 0.0 standard drinks    Comment: 1-2 beers a couple times a month    Drug use: Not Currently    Types: Marijuana    Comment: Previously opioid addiction went  through rehab     Allergies   Gabapentin; Metoclopramide hcl; Tramadol; Ativan [lorazepam]; Latex; Lyrica [pregabalin]; Trazodone and nefazodone; Zofran [ondansetron]; Sulfa antibiotics; Butrans [buprenorphine]; Nucynta [tapentadol]; Propofol; Tape; and Toradol [ketorolac tromethamine]   Review of Systems Review of Systems  Constitutional: Negative for chills and fever.  HENT: Negative for ear pain and sore throat.   Eyes: Negative for pain and visual disturbance.  Respiratory: Positive for shortness of breath. Negative for cough.   Cardiovascular: Positive for chest pain and palpitations.  Gastrointestinal: Negative for abdominal pain and vomiting.  Genitourinary: Negative for dysuria and hematuria.  Musculoskeletal: Positive for back pain. Negative for arthralgias.  Skin: Negative for color change and rash.  Neurological: Positive for weakness. Negative for seizures and syncope.  All other systems reviewed and are negative.    Physical Exam Updated Vital Signs BP 104/67    Pulse (!) 56    Temp 97.8 F (36.6 C) (Oral)    Resp 14    SpO2 94%   Physical Exam Vitals signs and nursing note reviewed.  Constitutional:      General: She is not in acute distress.    Appearance: She is well-developed. She is ill-appearing.     Comments: Ill appearing, and older than stated.   HENT:     Head: Normocephalic and atraumatic.     Mouth/Throat:     Mouth: Mucous membranes are dry.     Pharynx: No oropharyngeal exudate or posterior oropharyngeal erythema.  Eyes:     Pupils: Pupils are equal, round, and reactive to light.  Neck:     Musculoskeletal: Normal range of motion and neck supple.  Cardiovascular:     Rate and Rhythm: Regular rhythm.     Heart sounds: Normal heart sounds.  Pulmonary:     Effort: Pulmonary effort is normal. No respiratory distress.     Breath sounds: Examination of the right-lower field reveals decreased breath sounds. Examination of the left-lower field  reveals decreased breath sounds. Decreased breath sounds present. No wheezing, rhonchi or rales.  Chest:     Chest wall: Tenderness present.  Abdominal:     General: Bowel sounds are normal. There is no distension.     Palpations: Abdomen is soft.     Tenderness: There is no abdominal tenderness.  Musculoskeletal:        General: No tenderness or deformity.     Right lower leg: No edema.     Left lower leg: No edema.  Skin:    General: Skin is warm and dry.  Neurological:     Mental Status: She is alert and oriented to person, place, and time.      ED Treatments / Results  Labs (all labs ordered are listed, but only abnormal results are displayed) Labs Reviewed  CBC WITH DIFFERENTIAL/PLATELET - Abnormal; Notable for the following components:      Result Value   RBC 3.66 (*)    Hemoglobin 10.0 (*)    HCT 32.5 (*)    RDW 15.6 (*)    All other components within  normal limits  COMPREHENSIVE METABOLIC PANEL - Abnormal; Notable for the following components:   Chloride 113 (*)    CO2 18 (*)    Calcium 8.5 (*)    Total Protein 5.8 (*)    All other components within normal limits  SARS CORONAVIRUS 2 (HOSPITAL ORDER, [facility])  TROPONIN I  D-DIMER, QUANTITATIVE (NOT AT [facility])  URINALYSIS, ROUTINE W REFLEX MICROSCOPIC  HIV ANTIBODY (ROUTINE TESTING W REFLEX)  ABO/RH    EKG EKG Interpretation  Date/Time:  Saturday Apr 21 2019 17:03:03 EDT Ventricular Rate:  88 PR Interval:    QRS Duration: 85 QT Interval:  367 QTC Calculation: 444 R Axis:   51 Text Interpretation:  Sinus rhythm Since last tracing rate faster Confirmed by Dorie Rank [phone]) on 04/21/2019 5:05:27 PM   Radiology Dg Chest 2 View  Result Date: 04/21/2019 CLINICAL DATA:  Short of breath and chest pain EXAM: CHEST - 2 VIEW COMPARISON:  08/02/2018 FINDINGS: Normal heart size. No pleural effusion or edema. No airspace opacities. Review of the visualized osseous structures is  unremarkable. IMPRESSION: 1. No acute cardiopulmonary abnormalities. Electronically Signed   By: Kerby Moors M.D.   On: 04/21/2019 18:44   Ct Angio Chest/abd/pel For Dissection W And/or W/wo  Result Date: 04/21/2019 CLINICAL DATA:  Dizziness, syncope, chest pressure, shortness of breath EXAM: CT ANGIOGRAPHY CHEST, ABDOMEN AND PELVIS TECHNIQUE: Multidetector CT imaging through the chest, abdomen and pelvis was performed using the standard protocol during bolus administration of intravenous contrast. Multiplanar reconstructed images and MIPs were obtained and reviewed to evaluate the vascular anatomy. CONTRAST:  11m OMNIPAQUE IOHEXOL 350 MG/ML SOLN COMPARISON:  None. FINDINGS: CTA CHEST FINDINGS Cardiovascular: Preferential opacification of the thoracic aorta. No evidence of thoracic aortic aneurysm or dissection. Incidental note of direct origin of the left vertebral artery from the aortic arch. Normal heart size. No pericardial effusion. Mediastinum/Nodes: No enlarged mediastinal, hilar, or axillary lymph nodes. Thyroid gland, trachea, and esophagus demonstrate no significant findings. Lungs/Pleura: Lungs are clear. No pleural effusion or pneumothorax. Upper Abdomen: No acute abnormality. Musculoskeletal: No chest wall abnormality. No acute or significant osseous findings. Review of the MIP images confirms the above findings. CTA ABDOMEN AND PELVIS FINDINGS VASCULAR Aorta: Normal caliber aorta without aneurysm, dissection, vasculitis or significant stenosis. Celiac: Patent without evidence of aneurysm, dissection, vasculitis or significant stenosis. SMA: Patent without evidence of aneurysm, dissection, vasculitis or significant stenosis. Renals: Both renal arteries are patent without evidence of aneurysm, dissection, vasculitis, fibromuscular dysplasia or significant stenosis. IMA: Patent without evidence of aneurysm, dissection, vasculitis or significant stenosis. Inflow: Patent without evidence of  aneurysm, dissection, vasculitis or significant stenosis. Veins: No obvious venous abnormality within the limitations of this arterial phase study. Review of the MIP images confirms the above findings. NON-VASCULAR Hepatobiliary: No focal liver abnormality is seen. Status post cholecystectomy. No biliary dilatation. Pancreas: Unremarkable. No pancreatic ductal dilatation or surrounding inflammatory changes. Spleen: Normal in size without focal abnormality. Adrenals/Urinary Tract: Adrenal glands are unremarkable. Kidneys are normal, without renal calculi, focal lesion, or hydronephrosis. Bladder is unremarkable. Stomach/Bowel: Postoperative findings of Roux-en-Y gastric bypass. Appendix appears normal. No evidence of bowel wall thickening, distention, or inflammatory changes. Large burden of stool in the colon and dense stool balls in the distal colon and rectum. Vascular/Lymphatic: No significant vascular findings are present. No enlarged abdominal or pelvic lymph nodes. Reproductive: No mass or other abnormality. Other: No abdominal wall hernia or abnormality. No abdominopelvic ascites. Musculoskeletal: No acute or  significant osseous findings. IMPRESSION: 1. Normal contour and caliber of the aorta. No evidence of aortic dissection, aneurysm, or other aortic pathology. No significant atherosclerosis. 2.  Status post gastric bypass and cholecystectomy. 3. Large burden of stool in the colon and dense stool balls in the distal colon and rectum. Electronically Signed   By: Eddie Candle M.D.   On: 04/21/2019 21:01    Procedures Procedures (including critical care time)  Medications Ordered in ED Medications  HYDROcodone-acetaminophen (NORCO) 10-325 MG per tablet 1 tablet (has no administration in time range)  fentaNYL (SUBLIMAZE) injection 25 mcg (25 mcg Intravenous Given 04/21/19 1837)  sodium chloride 0.9 % bolus 500 mL (0 mLs Intravenous Stopped 04/21/19 1952)  iohexol (OMNIPAQUE) 350 MG/ML injection 100 mL  (100 mLs Intravenous Contrast Given 04/21/19 2017)  fentaNYL (SUBLIMAZE) injection 25 mcg (25 mcg Intravenous Given 04/21/19 2048)     Initial Impression / Assessment and Plan / ED Course  I have reviewed the triage vital signs and the nursing notes.  Pertinent labs & imaging results that were available during my care of the patient were reviewed by me and considered in my medical decision making (see chart for details).    Patient presents with chief complaint of shortness of breath, chest pain.  Seen at a [facility] ED this morning, she presents to the ED for worsening symptoms, reports she has not been able to get out of bed recently as she feels very weak along with short of breath.  Patient is satting at 100% on room air, not on any nasal cannula.  I have extensively review [facility] records, she had a chest x-ray, troponin, hemoglobin was 10 this morning.  Was sent home to follow-up with PCP. Patient states her heart rate has been in the 100s elevated while at home while she has been at rest, will obtain a d-dimer level, this was not obtained at the previous visit.  CBC showed a hemoglobin of 10, this is decreased in the low as patient has been, she attributes this to her symptoms.  She denies any rectal bleeding, was Hemoccult negative this morning, denies any blood in her vomit, she also had a previous ablation, no vaginal bleeding.  CMP showed slight decrease in CO2, creatinine level is within normal limits, no hypokalemia or hyponatremia.  Troponin level was negative, due to patient's constant complaint of shortness of breath a COVID-19 swab was obtained, this was negative in nature.  CT angios to rule out dissection was obtained as patient's pressures remained low while in the ED, she complained of chest pain along with shortness of breath.  CT angios showed: 1. Normal contour and caliber of the aorta. No evidence of aortic  dissection, aneurysm, or other aortic pathology. No  significant  atherosclerosis.    2. Status post gastric bypass and cholecystectomy.    3. Large burden of stool in the colon and dense stool balls in the  distal colon and rectum.   Results have been discussed with patient at length, her work-up has been reassuring so far, patient reports she will follow-up with her PCP.  She would like pain medication, advised her I will give her a short course of Norco, she reports Norco 10 usually works for her pain.  I will prescribe a short course for this, she is also instructed to drink some MiraLAX as she is likely constipated per CT study.  Patient does not voice any constipation at this time.  With otherwise stable vital signs  patient will be discharged home with PCP follow-up.  Return precautions provided at length.  A prescription for hydrocodone was seen on patient's PMP database, she reports she has completed the course of this medication, I have explained to her that I can only provide her a couple of tablets to help with her pain.  Patient does have a previous history of substance abuse, felt benefits at this time would outweigh the risk.  Patient was requesting this at this time, will provide her with Norco while in the ED along with 5 tablets to help while she is at home and can get to her PCP on Monday.  Patient agrees with management at this time.  Portions of this note were generated with Lobbyist. Dictation errors may occur despite best attempts at proofreading.   Final Clinical Impressions(s) / ED Diagnoses   Final diagnoses:  Shortness of breath    ED Discharge Orders         Ordered    HYDROcodone-acetaminophen (NORCO/VICODIN) 5-325 MG tablet  Every 4 hours PRN     04/21/19 2116           Janeece Fitting, PA-C 04/21/19 2117    Dorie Rank, MD 04/22/19 [phone]

## 2019-04-21 NOTE — ED Triage Notes (Addendum)
Pt reports dizzy spells with a syncopal episode Monday,  chest pressure and SOB since last Monday, was seen by PCP who did labs. Pt went to [facility] ER last night and had  EKG done, has sent home with keflex for UTI. Hgb was 10. Pt having difficulty getting sentences out due to SOB.

## 2019-04-22 LAB — HIV ANTIBODY (ROUTINE TESTING W REFLEX): HIV Screen 4th Generation wRfx: NONREACTIVE

## 2019-04-23 ENCOUNTER — Other Ambulatory Visit: Payer: Self-pay

## 2019-04-23 ENCOUNTER — Other Ambulatory Visit (INDEPENDENT_AMBULATORY_CARE_PROVIDER_SITE_OTHER): Payer: Self-pay | Admitting: *Deleted

## 2019-04-23 ENCOUNTER — Ambulatory Visit (INDEPENDENT_AMBULATORY_CARE_PROVIDER_SITE_OTHER): Payer: Medicaid Other | Admitting: Internal Medicine

## 2019-04-23 ENCOUNTER — Encounter (INDEPENDENT_AMBULATORY_CARE_PROVIDER_SITE_OTHER): Payer: Self-pay | Admitting: Internal Medicine

## 2019-04-23 ENCOUNTER — Telehealth: Payer: Self-pay | Admitting: Family Medicine

## 2019-04-23 DIAGNOSIS — D649 Anemia, unspecified: Secondary | ICD-10-CM | POA: Insufficient documentation

## 2019-04-23 DIAGNOSIS — K219 Gastro-esophageal reflux disease without esophagitis: Secondary | ICD-10-CM | POA: Diagnosis not present

## 2019-04-23 DIAGNOSIS — K920 Hematemesis: Secondary | ICD-10-CM

## 2019-04-23 DIAGNOSIS — D508 Other iron deficiency anemias: Secondary | ICD-10-CM

## 2019-04-23 DIAGNOSIS — D500 Iron deficiency anemia secondary to blood loss (chronic): Secondary | ICD-10-CM

## 2019-04-23 LAB — ABO/RH: ABO/RH(D): A POS

## 2019-04-23 MED ORDER — GENERIC EXTERNAL MEDICATION
Status: DC
Start: ? — End: 2019-04-23

## 2019-04-23 MED ORDER — PANTOPRAZOLE SODIUM 40 MG PO TBEC: 40.0000 mg | DELAYED_RELEASE_TABLET | Freq: Every day | ORAL | 3 refills | Status: DC

## 2019-04-23 NOTE — Patient Instructions (Addendum)
Will set up for an EGD. Patient is aware.  Will need an Iron transfusion.

## 2019-04-23 NOTE — Telephone Encounter (Signed)
She can do a follow-up visit here in the office this week

## 2019-04-23 NOTE — Progress Notes (Addendum)
Subjective:    Patient ID: Penny [name], female    DOB: 03/18/1986, 33 y.o.   MRN: [id number] Visit started 235am. Ended 305pm. Total time 30 minutes.  HPI This is a telephone OV. Unable to do virtual. Patient did not answer her phone. She is at home. I am in the office. Telephone OV due to risk of COVID-19 virus. F/u for constipation. She tells me she has GERD which has become worse. Has been taking OTC Omeprazole. Has been taking Omeprazole for a few months. Has been off the Protonix for 4 months. As far as her constipation is concerned, she has a BM every couple of days. She does not take anything for her constipation.  Her appetite is good. She states her weight is 175. She has had a drop in her hemoglobin. She was guaiac negative in the ED 04/21/2019 [facility]). . Seen for SOB. . She does not have a period.  04/18/19 Ferritin  6. Hx of IDA. Has not been craving ice. States her vomit was dark red about 3 weeks ago. States her last episode of vomiting blood was Saturday.  No NSAIDS.  Her last EGD was in January of 2018.   In 2016 she was found to have an anastomotic ulcer.   12/06/2016 EGD: diararhea, N and V. PSHX of Gastric bypass. Normal esophagus. Possible torsion of gastric remnant. Normal examined jejunum.      EGD 04/10/2015 EGD: Normal appearing  esophagus-status post passage of a Maloney dilatory. Surgically altered stomach. Anastomotic ulcer present-status post biopsy.    Hx of gastric bypass in 2007.   CBC    Component Value Date/Time   WBC 8.0 04/21/2019 1725   RBC 3.66 (L) 04/21/2019 1725   HGB 10.0 (L) 04/21/2019 1725   HGB 10.7 (L) 04/18/2019 1101   HCT 32.5 (L) 04/21/2019 1725   HCT 33.6 (L) 04/18/2019 1101   PLT 319 04/21/2019 1725   PLT 362 04/18/2019 1101   MCV 88.8 04/21/2019 1725   MCV 87 04/18/2019 1101   MCH 27.3 04/21/2019 1725   MCHC 30.8 04/21/2019 1725   RDW 15.6 (H) 04/21/2019 1725   RDW 15.9 (H) 04/18/2019 1101   LYMPHSABS 1.9  04/21/2019 1725   LYMPHSABS 2.1 04/18/2019 1101   MONOABS 0.7 04/21/2019 1725   EOSABS 0.2 04/21/2019 1725   EOSABS 0.4 04/18/2019 1101   BASOSABS 0.0 04/21/2019 1725   BASOSABS 0.0 04/18/2019 1101   CBC Latest Ref Rng & Units 04/21/2019 04/18/2019 07/25/2018  WBC 4.0 - 10.5 K/uL 8.0 5.3 3.8(L)  Hemoglobin 12.0 - 15.0 g/dL 10.0(L) 10.7(L) 12.1  Hematocrit 36.0 - 46.0 % 32.5(L) 33.6(L) 37.4  Platelets 150 - 400 K/uL 319 362 210          Review of Systems Past Medical History:  Diagnosis Date  . ADD (attention deficit disorder)   . Anemia 2011   2o to GASTRIC BYPASS  . Anginal pain ([place])   . Anxiety   . Blood transfusion without reported diagnosis   . Chronic daily headache   . Depression   . Depression   . Dysmenorrhea 09/18/2013  . Dysrhythmia   . Elevated liver enzymes JUL 2011ALK PHOS 111-127 AST  143-267 ALT  213-321T BILI 0.6  ALB  3.7-4.06 Jun 2011 ALK PHOS 118 AST 24 ALT 42 T BILI 0.4 ALB 3.9  . Encounter for drug rehabilitation    at behavioral health for opioid addiction about 4 years ago  . Family  history of adverse reaction to anesthesia    'dad had to be kept on pump for breathing for morphine'  . Fatty liver   . Fibroid 01/18/2017  . Fibromyalgia   . Gastric bypass status for obesity   . Gastritis JULY 2011  . Heart murmur   . 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 History of Holter monitoring   . Hx of opioid abuse ([place])    for about 4 years, about 4 years ago  . Interstitial cystitis   . Iron deficiency anemia 07/23/2010  . Irritable bowel syndrome 2012 DIARRHEA   JUN 2012 TTG IgA 14.9  . IUD FEB 2010  . Lupus ([place])   . Menorrhagia 07/18/2013  . Migraines   . Obesity (BMI 30-39.9) 2011 228 LBS BMI 36.8  . Ovarian cyst   . Patient desires pregnancy 09/18/2013  . Polyneuropathy   . PONV (postoperative nausea and vomiting) seizure post-operatively   seizure following ankle surgery  . Potassium (K) deficiency   . Pregnant  12/25/2013  . Psychiatric pseudoseizure   . RLQ abdominal pain 07/18/2013  . Sciatica of left side 11/14/2014  . 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 Stress 09/18/2013    Past Surgical History:  Procedure Laterality Date  . BIOPSY  04/10/2015   Procedure: BIOPSY;  Surgeon: Daneil Dolin, MD;  Location: [facility];  Service: Endoscopy;;  . cath self every nite     for sodium bicarb injection (discontinued 2013)  . CHOLECYSTECTOMY  2005   biliary dyskinesia  . COLONOSCOPY  JUN 2012 ABD PN/DIARRHEA WITH PROPOFOL   NL COLON  . DILATION AND CURETTAGE OF UTERUS    . DILITATION & CURRETTAGE/HYSTROSCOPY WITH NOVASURE ABLATION N/A 03/24/2017   Procedure: DILATATION & CURETTAGE/HYSTEROSCOPY WITH NOVASURE ENDOMETRIAL ABLATION;  Surgeon: Jonnie Kind, MD;  Location: [facility];  Service: Gynecology;  Laterality: N/A;  . ESOPHAGEAL DILATION N/A 04/10/2015   Procedure: ESOPHAGEAL DILATION WITH 54FR MALONEY DILATOR;  Surgeon: Daneil Dolin, MD;  Location: [facility];  Service: Endoscopy;  Laterality: N/A;  . ESOPHAGOGASTRODUODENOSCOPY    . ESOPHAGOGASTRODUODENOSCOPY (EGD) WITH PROPOFOL N/A 04/10/2015   Procedure: ESOPHAGOGASTRODUODENOSCOPY (EGD) WITH PROPOFOL;  Surgeon: Daneil Dolin, MD;  Location: [facility];  Service: Endoscopy;  Laterality: N/A;  . ESOPHAGOGASTRODUODENOSCOPY (EGD) WITH PROPOFOL N/A 12/06/2016   Procedure: ESOPHAGOGASTRODUODENOSCOPY (EGD) WITH PROPOFOL;  Surgeon: Danie Binder, MD;  [facility];  Service: Endoscopy;  Laterality: N/A;  . GAB  2007   in [place]-POUCH 5 CM  . GASTRIC BYPASS  06/2006  . HYSTEROSCOPY W/D&C N/A 09/12/2014   Procedure: DILATATION AND CURETTAGE /HYSTEROSCOPY;  Surgeon: Jonnie Kind, MD;  Location: [facility];  Service: Gynecology;  Laterality: N/A;  . KYPHOPLASTY N/A 08/02/2018   Procedure: KYPHOPLASTY T12;  Surgeon: Melina Schools, MD;  Location: [place];  Service: Orthopedics;  Laterality: N/A;  60 mins  . LAPAROSCOPIC TUBAL LIGATION Bilateral  03/24/2017   Procedure: LAPAROSCOPIC TUBAL LIGATION ([place] Rings);  Surgeon: Jonnie Kind, MD;  Location: [facility];  Service: Gynecology;  Laterality: Bilateral;  . REPAIR VAGINAL CUFF N/A 07/30/2014   Procedure: REPAIR VAGINAL CUFF;  Surgeon: Mora Bellman, MD;  Location: [facility];  Service: Gynecology;  Laterality: N/A;  . SAVORY DILATION  06/20/2012   Dr. Barnie Alderman gastritis/Ulcer in the mid jejunum. Empiric dilation.   . SURAL NERVE BX Left 02/25/2016   Procedure: LEFT SURAL NERVE BIOPSY;  Surgeon: Jovita Gamma, MD;  Location: [facility];  Service: Neurosurgery;  Laterality: Left;  Left sural nerve biopsy  . TONSILLECTOMY    . TONSILLECTOMY AND ADENOIDECTOMY    . UPPER GASTROINTESTINAL ENDOSCOPY  JULY 2011 NAUSEA-D125,V6, PH 25   Bx; GASTRITIS, POUCH-5 CM LONG  . WISDOM TOOTH EXTRACTION      Allergies  Allergen Reactions  . Gabapentin Other (See Comments)    Pt states that she was unresponsive after taking this medication, but her vitals remained stable.    . Metoclopramide Hcl Anxiety and Other (See Comments)    Pt states that she felt like she was trapped in a box, and could not get out.  Pt also states that she had temporary loss of movement, weakness, and tingling.    . Tramadol Other (See Comments)    Seizures  . Ativan [Lorazepam] Other (See Comments)    combative  . Latex Itching, Rash and Other (See Comments)    Burns  . Lyrica [Pregabalin] Other (See Comments)    suicidal  . Trazodone And Nefazodone Other (See Comments)    Makes pt "like a zombie"  . Zofran [Ondansetron] Other (See Comments)    Migraines in high doses    . Sulfa Antibiotics Hives  . Butrans [Buprenorphine] Rash    Patch caused rash, didn't help with pain  . Nucynta [Tapentadol] Nausea And Vomiting  . Propofol Nausea And Vomiting  . Tape Itching, Rash and Other (See Comments)    Please use paper tape  . Toradol [Ketorolac Tromethamine] Anxiety    Current Outpatient Medications on File Prior  to Visit  Medication Sig Dispense Refill  . amphetamine-dextroamphetamine (ADDERALL XR) 30 MG 24 hr capsule TAKE (1) CAPSULE BY MOUTH EVERY DAY. (Patient taking differently: Take 30 mg by mouth daily. ) 30 capsule 0  . carbamazepine (TEGRETOL) 200 MG tablet Take 2 tablets (400 mg total) by mouth at bedtime. 60 tablet 2  . clonazePAM (KLONOPIN) 0.5 MG tablet Take 1 tablet (0.5 mg total) by mouth 3 (three) times daily as needed for anxiety. 90 tablet 1  . DULoxetine (CYMBALTA) 60 MG capsule Take 1 capsule (60 mg total) by mouth at bedtime. 60 capsule 2  . HYDROcodone-acetaminophen (NORCO/VICODIN) 5-325 MG tablet Take 1 tablet by mouth every 4 (four) hours as needed for up to 5 days. 10 tablet 0  . mirtazapine (REMERON) 30 MG tablet Take 1 tablet (30 mg total) by mouth at bedtime. 90 tablet 2  . phenazopyridine (PYRIDIUM) 200 MG tablet Take 200 mg by mouth 3 (three) times daily as needed for pain.    Marland Kitchen PROAIR HFA 108 (90 Base) MCG/ACT inhaler INHALE 2 PUFFS INTO THE LUNGS EVERY 6 HOURS AS NEEDED. (Patient taking differently: Inhale 2 puffs into the lungs every 6 (six) hours as needed for wheezing or shortness of breath. ) 8.5 g 0  . promethazine (PHENERGAN) 25 MG tablet TAKE ONE TABLET BY MOUTH EVERY 6 HOURS AS NEEDED. (Patient taking differently: Take 25 mg by mouth every 6 (six) hours as needed for nausea or vomiting. ) 30 tablet 0  . topiramate (TOPAMAX) 50 MG tablet Take one in the am and 2 at night 90 tablet 2  . valACYclovir (VALTREX) 1000 MG tablet Take 1 bid for 10 days then Take  1 daily for suppression 90 tablet prn   No current facility-administered medications on file prior to visit.         Objective:   Physical Exam Deferred.        Assessment & Plan:  Hematemesis Will need an EGD to rule out PUD.  IDA. Will set up for an iron transfusion. I have requested.  GERD: Rx for Protonix sent to her pharmacy.

## 2019-04-23 NOTE — Telephone Encounter (Signed)
Pt went to ER over weekend and was instructed to do ER follow up. Pt has hospital follow up in office visit scheduled for 5/29. Should we keep that one or move her up?

## 2019-04-25 ENCOUNTER — Other Ambulatory Visit: Payer: Self-pay

## 2019-04-25 ENCOUNTER — Encounter (HOSPITAL_COMMUNITY)
Admission: RE | Admit: 2019-04-25 | Discharge: 2019-04-25 | Disposition: A | Discharge status: Home / Self Care | Payer: Medicaid Other | Source: Ambulatory Visit | Attending: Internal Medicine | Admitting: Internal Medicine

## 2019-04-25 DIAGNOSIS — Z01818 Encounter for other preprocedural examination: Secondary | ICD-10-CM | POA: Insufficient documentation

## 2019-04-26 ENCOUNTER — Encounter (HOSPITAL_COMMUNITY): Payer: Self-pay

## 2019-04-26 ENCOUNTER — Ambulatory Visit: Payer: Medicaid Other | Admitting: Family Medicine

## 2019-04-26 ENCOUNTER — Other Ambulatory Visit: Payer: Self-pay

## 2019-04-26 DIAGNOSIS — R55 Syncope and collapse: Secondary | ICD-10-CM

## 2019-04-26 NOTE — Progress Notes (Signed)
04/26/2019-1135-Pt has been self quarantined. Does not need repeated covid test.

## 2019-04-26 NOTE — Progress Notes (Signed)
   Subjective:    Patient ID: Penny [name], female    DOB: 09-26-1986, 33 y.o.   MRN: [id number] I tried several times to connect with patient but was unable to do so HPI  Patient calls for an ER follow up. Patient states she is having ongoing difficulties and feeling really bad at this moment with ongoing syncope spells frequently.  Virtual Visit via Video Note  I connected with Penny [name] on 04/26/19 at  1:10 PM EDT by a video enabled telemedicine application and verified that I am speaking with the correct person using two identifiers.  Location: Patient: home Provider: office   I discussed the limitations of evaluation and management by telemedicine and the availability of in person appointments. The patient expressed understanding and agreed to proceed.  History of Present Illness:    Observations/Objective:   Assessment and Plan:   Follow Up Instructions:    I discussed the assessment and treatment plan with the patient. The patient was provided an opportunity to ask questions and all were answered. The patient agreed with the plan and demonstrated an understanding of the instructions.   The patient was advised to call back or seek an in-person evaluation if the symptoms worsen or if the condition fails to improve as anticipated.  I provided 0 minutes of non-face-to-face time during this encounter.      Review of Systems     Objective:   Physical Exam        Assessment & Plan:  Message was left for patient to reschedule should she desire to do so

## 2019-04-27 ENCOUNTER — Encounter (HOSPITAL_COMMUNITY)
Admission: RE | Disposition: A | Discharge status: Home / Self Care | Payer: Self-pay | Source: Home / Self Care | Attending: Internal Medicine

## 2019-04-27 ENCOUNTER — Encounter (HOSPITAL_COMMUNITY): Payer: Self-pay | Admitting: Anesthesiology

## 2019-04-27 ENCOUNTER — Ambulatory Visit (HOSPITAL_COMMUNITY)
Admission: RE | Admit: 2019-04-27 | Discharge: 2019-04-27 | Disposition: A | Discharge status: Home / Self Care | Payer: Medicaid Other | Attending: Internal Medicine | Admitting: Internal Medicine

## 2019-04-27 ENCOUNTER — Other Ambulatory Visit: Payer: Self-pay

## 2019-04-27 ENCOUNTER — Ambulatory Visit (HOSPITAL_COMMUNITY): Payer: Medicaid Other | Admitting: Anesthesiology

## 2019-04-27 DIAGNOSIS — D649 Anemia, unspecified: Secondary | ICD-10-CM | POA: Insufficient documentation

## 2019-04-27 DIAGNOSIS — K289 Gastrojejunal ulcer, unspecified as acute or chronic, without hemorrhage or perforation: Secondary | ICD-10-CM | POA: Diagnosis not present

## 2019-04-27 DIAGNOSIS — F1111 Opioid abuse, in remission: Secondary | ICD-10-CM | POA: Insufficient documentation

## 2019-04-27 DIAGNOSIS — F17290 Nicotine dependence, other tobacco product, uncomplicated: Secondary | ICD-10-CM | POA: Diagnosis not present

## 2019-04-27 DIAGNOSIS — G609 Hereditary and idiopathic neuropathy, unspecified: Secondary | ICD-10-CM | POA: Diagnosis not present

## 2019-04-27 DIAGNOSIS — R011 Cardiac murmur, unspecified: Secondary | ICD-10-CM | POA: Diagnosis not present

## 2019-04-27 DIAGNOSIS — D508 Other iron deficiency anemias: Secondary | ICD-10-CM | POA: Insufficient documentation

## 2019-04-27 DIAGNOSIS — F419 Anxiety disorder, unspecified: Secondary | ICD-10-CM | POA: Diagnosis not present

## 2019-04-27 DIAGNOSIS — R569 Unspecified convulsions: Secondary | ICD-10-CM | POA: Diagnosis not present

## 2019-04-27 DIAGNOSIS — D759 Disease of blood and blood-forming organs, unspecified: Secondary | ICD-10-CM | POA: Insufficient documentation

## 2019-04-27 DIAGNOSIS — I10 Essential (primary) hypertension: Secondary | ICD-10-CM | POA: Insufficient documentation

## 2019-04-27 DIAGNOSIS — Z9884 Bariatric surgery status: Secondary | ICD-10-CM | POA: Diagnosis not present

## 2019-04-27 DIAGNOSIS — K5909 Other constipation: Secondary | ICD-10-CM | POA: Diagnosis not present

## 2019-04-27 DIAGNOSIS — Z79899 Other long term (current) drug therapy: Secondary | ICD-10-CM | POA: Diagnosis not present

## 2019-04-27 DIAGNOSIS — K279 Peptic ulcer, site unspecified, unspecified as acute or chronic, without hemorrhage or perforation: Secondary | ICD-10-CM

## 2019-04-27 DIAGNOSIS — K582 Mixed irritable bowel syndrome: Secondary | ICD-10-CM | POA: Diagnosis not present

## 2019-04-27 DIAGNOSIS — K920 Hematemesis: Secondary | ICD-10-CM | POA: Insufficient documentation

## 2019-04-27 DIAGNOSIS — F329 Major depressive disorder, single episode, unspecified: Secondary | ICD-10-CM | POA: Insufficient documentation

## 2019-04-27 DIAGNOSIS — R51 Headache: Secondary | ICD-10-CM | POA: Insufficient documentation

## 2019-04-27 DIAGNOSIS — R079 Chest pain, unspecified: Secondary | ICD-10-CM | POA: Diagnosis not present

## 2019-04-27 DIAGNOSIS — M329 Systemic lupus erythematosus, unspecified: Secondary | ICD-10-CM | POA: Diagnosis not present

## 2019-04-27 DIAGNOSIS — K284 Chronic or unspecified gastrojejunal ulcer with hemorrhage: Secondary | ICD-10-CM | POA: Diagnosis not present

## 2019-04-27 DIAGNOSIS — G709 Myoneural disorder, unspecified: Secondary | ICD-10-CM | POA: Diagnosis not present

## 2019-04-27 DIAGNOSIS — K219 Gastro-esophageal reflux disease without esophagitis: Secondary | ICD-10-CM

## 2019-04-27 HISTORY — PX: ESOPHAGOGASTRODUODENOSCOPY (EGD) WITH PROPOFOL: SHX5813

## 2019-04-27 SURGERY — ESOPHAGOGASTRODUODENOSCOPY (EGD) WITH PROPOFOL
Anesthesia: Monitor Anesthesia Care

## 2019-04-27 MED ORDER — LACTATED RINGERS IV SOLN
INTRAVENOUS | Status: DC | PRN
  Administered 2019-04-27: 07:00:00 via INTRAVENOUS

## 2019-04-27 MED ORDER — LACTATED RINGERS IV SOLN: INTRAVENOUS | Status: DC

## 2019-04-27 MED ORDER — KETAMINE HCL 10 MG/ML IJ SOLN
INTRAMUSCULAR | Status: DC | PRN
  Administered 2019-04-27: 15 mg via INTRAVENOUS

## 2019-04-27 MED ORDER — PROPOFOL 10 MG/ML IV BOLUS
INTRAVENOUS | Status: DC | PRN
  Administered 2019-04-27: 15 mg via INTRAVENOUS

## 2019-04-27 MED ORDER — MIDAZOLAM HCL 2 MG/2ML IJ SOLN
INTRAMUSCULAR | Status: AC
  Filled 2019-04-27: qty 2

## 2019-04-27 MED ORDER — PROPOFOL 500 MG/50ML IV EMUL
INTRAVENOUS | Status: DC | PRN
  Administered 2019-04-27: 125 ug/kg/min via INTRAVENOUS

## 2019-04-27 MED ORDER — MEPERIDINE HCL 50 MG/ML IJ SOLN: 6.2500 mg | INTRAMUSCULAR | Status: DC | PRN

## 2019-04-27 MED ORDER — KETAMINE HCL 50 MG/5ML IJ SOSY
PREFILLED_SYRINGE | INTRAMUSCULAR | Status: AC
  Filled 2019-04-27: qty 5

## 2019-04-27 MED ORDER — PROPOFOL 10 MG/ML IV BOLUS
INTRAVENOUS | Status: AC
  Filled 2019-04-27: qty 40

## 2019-04-27 MED ORDER — PANTOPRAZOLE SODIUM 40 MG PO TBEC: 40.0000 mg | DELAYED_RELEASE_TABLET | Freq: Two times a day (BID) | ORAL | 3 refills | Status: DC

## 2019-04-27 MED ORDER — MIDAZOLAM HCL 5 MG/5ML IJ SOLN
INTRAMUSCULAR | Status: DC | PRN
  Administered 2019-04-27: 2 mg via INTRAVENOUS

## 2019-04-27 MED ORDER — PROMETHAZINE HCL 25 MG/ML IJ SOLN: 6.2500 mg | INTRAMUSCULAR | Status: DC | PRN

## 2019-04-27 MED ORDER — DIPHENHYDRAMINE HCL 50 MG/ML IJ SOLN
INTRAMUSCULAR | Status: DC | PRN
  Administered 2019-04-27: 12.5 mg via INTRAVENOUS

## 2019-04-27 NOTE — Discharge Instructions (Signed)
Stool Culture Why am I having this test? A stool culture test is used to check for infections of the intestines that are caused by bacteria, viruses, or parasites. Your health care provider may order this test if you have symptoms such as a fever, abdominal pain, and diarrhea that last for more than a few days or weeks. What is being tested? This test checks your stool (feces) for bacteria, viruses, and parasites that may cause illness. What kind of sample is taken?  A stool sample is required for this test. You will collect the sample when you have a bowel movement. How do I collect samples at home? You may be asked to collect a stool sample at home. Follow instructions from a health care provider about how to collect the sample. When collecting a stool sample at home, make sure you:  Use supplies and instructions that you received from the lab.  Have a bowel movement directly into a clean, dry container. Do not collect stool from the water in the toilet.  Transfer the sample into the germ-free (sterile) cup that you received from the lab.  Do not let any toilet paper or urine get into the cup.  Wash your hands with soap and water after collecting the sample.  Return the sample to the lab as instructed. How do I prepare for this test?  Tell your health care provider about all medicines you are taking, including vitamins, herbs, eye drops, creams, and over-the-counter medicines. How are the results reported? Your test results will be reported as normal or will identify the abnormal bacteria, virus, or parasite that was found. What do the results mean? Normal results include:  Normal intestinal flora. This means that the bacteria and fungi that are present are typically found in your intestines and are present in normal amounts.  No ova or parasite infestation. This means that there is no evidence of parasites in your intestines. Abnormal results will specify the bacteria, virus, or  parasite that is responsible for the infection. Talk with your health care provider about what your results mean. Questions to ask your health care provider Ask your health care provider, or the department that is doing the test:  When will my results be ready?  How will I get my results?  What are my treatment options?  What other tests do I need?  What are my next steps? Summary  A stool culture test is used to check for infections of the intestines that are caused by bacteria, viruses, or parasites.  Your health care provider may order this test if you have symptoms such as a fever, abdominal pain, and diarrhea that last for more than a few days or weeks.  A stool sample is required for this test. You will collect the sample when you have a bowel movement. This information is not intended to replace advice given to you by your health care provider. Make sure you discuss any questions you have with your health care provider. Document Released: 12/25/2010 Document Revised: 07/19/2017 Document Reviewed: 07/19/2017 Elsevier Interactive Patient Education  2019 [employer]. Increase pantoprazole to 40 mg by mouth 30 minutes before breakfast and evening meal daily. Resume other medications as before. No OTC NSAIDs such as Advil Aleve and similar medications. Resume usual diet. Stool H. pylori antigen. Office visit in 2 months.

## 2019-04-27 NOTE — Progress Notes (Signed)
Awake. C/O chest pain. "Pressure in my chest. Like someone sitting on it." Skin warm/dry to touch. Denies nausea. VSS. resp adequate/nonlabored. O2 via nasal cannula at 2 L/M started. Tolerated well. Pt states the chest pain is constant, never "let's" up. Dr Sheralyn Boatman notified. No new orders at this time.

## 2019-04-27 NOTE — Anesthesia Preprocedure Evaluation (Signed)
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 Lap tubal 2018 Talked with pt re plan- no opiate products indicated for sedation for EGD, intraprocedure, post-procedure  Reproductive/Obstetrics                             Anesthesia Physical Anesthesia Plan  ASA: III  Anesthesia Plan: MAC   Post-op Pain Management:    Induction:   PONV Risk Score and Plan:   Airway Management Planned:   Additional Equipment:   Intra-op Plan:   Post-operative Plan:   Informed Consent: I have reviewed the patients History and Physical, chart, labs and discussed the procedure including the risks, benefits and alternatives for the proposed anesthesia with the patient or authorized representative who has indicated his/her understanding and acceptance.       Plan Discussed with: Anesthesiologist  Anesthesia Plan Comments:         Anesthesia Quick Evaluation

## 2019-04-27 NOTE — H&P (Signed)
Penny [name] is an 33 y.o. female.   Chief Complaint: Patient is here for EGD. HPI: Patient is 33 year old Caucasian female with multiple medical problems who is status post bariatric surgery 13 years ago who has a history of peptic ulcer disease and iron deficiency anemia felt to be due to impaired iron absorption.  She experienced hematemesis 3 weeks ago.  She is complaining of constant chest pain which started 3 weeks ago.  She has been in the emergency room and etiology ruled out.  Patient believes her iron deficiency is causing her pain.  She denies dysphagia melena or rectal bleeding.  She has chronic constipation.  She takes OTC laxative/[employer] brand to every couple of days.  She says she has lost 50 pounds in the last 1 year.  He denies using OTC NSAIDs. Last EGD was January 2018 there was concern for gastric torsion but upper GI series was unremarkable.  EGD in May 2016 revealed anastomotic ulcer.  Past Medical History:  Diagnosis Date  . ADD (attention deficit disorder)   . Anemia 2011   2o to GASTRIC BYPASS  . Anginal pain ([place])   . Anxiety   . Blood transfusion without reported diagnosis   . Chronic daily headache   . Depression   . Depression   . Dysmenorrhea 09/18/2013  . Dysrhythmia   . Elevated liver enzymes JUL 2011ALK PHOS 111-127 AST  143-267 ALT  213-321T BILI 0.6  ALB  3.7-4.06 Jun 2011 ALK PHOS 118 AST 24 ALT 42 T BILI 0.4 ALB 3.9  . Encounter for drug rehabilitation    at behavioral health for opioid addiction about 4 years ago  . Family history of adverse reaction to anesthesia    'dad had to be kept on pump for breathing for morphine'  . Fatty liver   . Fibroid 01/18/2017  . Fibromyalgia   . Gastric bypass status for obesity   . Gastritis JULY 2011  . Heart murmur   . 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 History of Holter monitoring   . Hx of opioid abuse ([place])    for about 4 years, about 4 years ago  . Interstitial cystitis   .  Iron deficiency anemia 07/23/2010  . Irritable bowel syndrome 2012 DIARRHEA   JUN 2012 TTG IgA 14.9  . IUD FEB 2010  . Lupus ([place])   . Menorrhagia 07/18/2013  . Migraines   . Obesity (BMI 30-39.9) 2011 228 LBS BMI 36.8  . Ovarian cyst   . Patient desires pregnancy 09/18/2013  . Polyneuropathy   . PONV (postoperative nausea and vomiting) seizure post-operatively   seizure following ankle surgery  . Potassium (K) deficiency   . Pregnant 12/25/2013  . Psychiatric pseudoseizure   . RLQ abdominal pain 07/18/2013  . Sciatica of left side 11/14/2014  . 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 Stress 09/18/2013    Past Surgical History:  Procedure Laterality Date  . BIOPSY  04/10/2015   Procedure: BIOPSY;  Surgeon: Daneil Dolin, MD;  Location: [facility];  Service: Endoscopy;;  . cath self every nite     for sodium bicarb injection (discontinued 2013)  . CHOLECYSTECTOMY  2005   biliary dyskinesia  . COLONOSCOPY  JUN 2012 ABD PN/DIARRHEA WITH PROPOFOL   NL COLON  . DILATION AND CURETTAGE OF UTERUS    . DILITATION & CURRETTAGE/HYSTROSCOPY WITH NOVASURE ABLATION N/A 03/24/2017   Procedure: DILATATION & CURETTAGE/HYSTEROSCOPY WITH NOVASURE ENDOMETRIAL ABLATION;  Surgeon:  Jonnie Kind, MD;  Location: [facility];  Service: Gynecology;  Laterality: N/A;  . ESOPHAGEAL DILATION N/A 04/10/2015   Procedure: ESOPHAGEAL DILATION WITH 54FR MALONEY DILATOR;  Surgeon: Daneil Dolin, MD;  Location: [facility];  Service: Endoscopy;  Laterality: N/A;  . ESOPHAGOGASTRODUODENOSCOPY    . ESOPHAGOGASTRODUODENOSCOPY (EGD) WITH PROPOFOL N/A 04/10/2015   Procedure: ESOPHAGOGASTRODUODENOSCOPY (EGD) WITH PROPOFOL;  Surgeon: Daneil Dolin, MD;  Location: [facility];  Service: Endoscopy;  Laterality: N/A;  . ESOPHAGOGASTRODUODENOSCOPY (EGD) WITH PROPOFOL N/A 12/06/2016   Procedure: ESOPHAGOGASTRODUODENOSCOPY (EGD) WITH PROPOFOL;  Surgeon: Danie Binder, MD;  [facility];  Service: Endoscopy;  Laterality: N/A;  . GAB   2007   in [place]-POUCH 5 CM  . GASTRIC BYPASS  06/2006  . HYSTEROSCOPY W/D&C N/A 09/12/2014   Procedure: DILATATION AND CURETTAGE /HYSTEROSCOPY;  Surgeon: Jonnie Kind, MD;  Location: [facility];  Service: Gynecology;  Laterality: N/A;  . KYPHOPLASTY N/A 08/02/2018   Procedure: KYPHOPLASTY T12;  Surgeon: Melina Schools, MD;  Location: [facility];  Service: Orthopedics;  Laterality: N/A;  60 mins  . LAPAROSCOPIC TUBAL LIGATION Bilateral 03/24/2017   Procedure: LAPAROSCOPIC TUBAL LIGATION ([place] Rings);  Surgeon: Jonnie Kind, MD;  Location: [facility];  Service: Gynecology;  Laterality: Bilateral;  . REPAIR VAGINAL CUFF N/A 07/30/2014   Procedure: REPAIR VAGINAL CUFF;  Surgeon: Mora Bellman, MD;  Location: [facility];  Service: Gynecology;  Laterality: N/A;  . SAVORY DILATION  06/20/2012   Dr. Barnie Alderman gastritis/Ulcer in the mid jejunum. Empiric dilation.   . SURAL NERVE BX Left 02/25/2016   Procedure: LEFT SURAL NERVE BIOPSY;  Surgeon: Jovita Gamma, MD;  Location: [facility];  Service: Neurosurgery;  Laterality: Left;  Left sural nerve biopsy  . TONSILLECTOMY    . TONSILLECTOMY AND ADENOIDECTOMY    . UPPER GASTROINTESTINAL ENDOSCOPY  JULY 2011 NAUSEA-D125,V6, PH 25   Bx; GASTRITIS, POUCH-5 CM LONG  . WISDOM TOOTH EXTRACTION      Family History  Problem Relation Age of Onset  . Hemochromatosis Maternal Grandmother   . Migraines Maternal Grandmother   . Cancer Maternal Grandmother   . Breast cancer Maternal Grandmother   . Hypertension Father   . Diabetes Father   . Coronary artery disease Father   . Migraines Paternal Grandmother   . Breast cancer Paternal Grandmother   . Cancer Mother        breast  . Hemochromatosis Mother   . Breast cancer Mother   . Depression Mother   . Anxiety disorder Mother   . Coronary artery disease Paternal Grandfather   . Anxiety disorder Brother   . Bipolar disorder Brother   . Healthy Daughter   . Healthy Son    Social History:  reports that  she has been smoking e-cigarettes. She started smoking about a year ago. She has a 1.50 pack-year smoking history. She has never used smokeless tobacco. She reports current alcohol use. She reports previous drug use. Drug: Marijuana.  Allergies:  Allergies  Allergen Reactions  . Gabapentin Other (See Comments)    Pt states that she was unresponsive after taking this medication, but her vitals remained stable.    . Metoclopramide Hcl Anxiety and Other (See Comments)    Pt states that she felt like she was trapped in a box, and could not get out.  Pt also states that she had temporary loss of movement, weakness, and tingling.    . Tramadol Other (See Comments)  Seizures  . Ativan [Lorazepam] Other (See Comments)    combative  . Latex Itching, Rash and Other (See Comments)    [place]  . Lyrica [Pregabalin] Other (See Comments)    suicidal  . Trazodone And Nefazodone Other (See Comments)    Makes pt "like a zombie"  . Zofran [Ondansetron] Other (See Comments)    Migraines in high doses    . Sulfa Antibiotics Hives  . Butrans [Buprenorphine] Rash    Patch caused rash, didn't help with pain  . Nucynta [Tapentadol] Nausea And Vomiting  . Propofol Nausea And Vomiting  . Tape Itching, Rash and Other (See Comments)    Please use paper tape  . Toradol [Ketorolac Tromethamine] Anxiety    Medications Prior to Admission  Medication Sig Dispense Refill  . carbamazepine (TEGRETOL) 200 MG tablet Take 2 tablets (400 mg total) by mouth at bedtime. 60 tablet 2  . clonazePAM (KLONOPIN) 0.5 MG tablet Take 1 tablet (0.5 mg total) by mouth 3 (three) times daily as needed for anxiety. 90 tablet 1  . DULoxetine (CYMBALTA) 60 MG capsule Take 1 capsule (60 mg total) by mouth at bedtime. 60 capsule 2  . mirtazapine (REMERON) 30 MG tablet Take 1 tablet (30 mg total) by mouth at bedtime. 90 tablet 2  . pantoprazole (PROTONIX) 40 MG tablet Take 1 tablet (40 mg total) by mouth daily. 90 tablet 3  .  phenazopyridine (PYRIDIUM) 200 MG tablet Take 200 mg by mouth 3 (three) times daily as needed for pain.    . promethazine (PHENERGAN) 25 MG tablet TAKE ONE TABLET BY MOUTH EVERY 6 HOURS AS NEEDED. (Patient taking differently: Take 25 mg by mouth every 6 (six) hours as needed for nausea or vomiting. ) 30 tablet 0  . topiramate (TOPAMAX) 50 MG tablet Take one in the am and 2 at night 90 tablet 2  . amphetamine-dextroamphetamine (ADDERALL XR) 30 MG 24 hr capsule TAKE (1) CAPSULE BY MOUTH EVERY DAY. (Patient taking differently: Take 30 mg by mouth daily. ) 30 capsule 0  . PROAIR HFA 108 (90 Base) MCG/ACT inhaler INHALE 2 PUFFS INTO THE LUNGS EVERY 6 HOURS AS NEEDED. (Patient taking differently: Inhale 2 puffs into the lungs every 6 (six) hours as needed for wheezing or shortness of breath. ) 8.5 g 0  . valACYclovir (VALTREX) 1000 MG tablet Take 1 bid for 10 days then Take  1 daily for suppression 90 tablet prn    No results found for this or any previous visit (from the past 48 hour(s)). No results found.  ROS  Blood pressure (!) 111/58, temperature 97.8 F (36.6 C), resp. rate (!) 22, SpO2 100 %. Physical Exam  Constitutional: She appears well-developed and well-nourished.  HENT:  Mouth/Throat: Oropharynx is clear and moist.  Eyes: Conjunctivae are normal. No scleral icterus.  Neck: No thyromegaly present.  Cardiovascular: Normal rate, regular rhythm and normal heart sounds.  No murmur heard. Respiratory: Effort normal and breath sounds normal.  GI:  Abdomen is symmetrical.  She has upper abdominal laparoscopy scars.  Abdomen is soft with mild tenderness epigastrium and right mid abdomen.  No organomegaly or masses..  Musculoskeletal:        General: No edema.  Neurological: She is alert.  Skin: Skin is warm and dry.     Assessment/Plan History of hematemesis. Iron deficiency anemia. Diagnostic EGD.  Hildred Laser, MD 04/27/2019, 7:25 AM

## 2019-04-27 NOTE — Transfer of Care (Signed)
Immediate Anesthesia Transfer of Care Note  [name]  Procedure(s) Performed: ESOPHAGOGASTRODUODENOSCOPY (EGD) WITH PROPOFOL (N/A )  Patient Location: PACU  Anesthesia Type:MAC  Level of Consciousness: drowsy  Airway & Oxygen Therapy: Patient Spontanous Breathing and Patient connected to nasal cannula oxygen  Post-op Assessment: Report given to RN, Post -op Vital signs reviewed and stable and Patient moving all extremities  Post vital signs: Reviewed and stable  Last Vitals:  Vitals Value Taken Time  BP    Temp    Pulse 32 04/27/2019  7:58 AM  Resp    SpO2 81 % 04/27/2019  7:58 AM  Vitals shown include unvalidated device data.  Last Pain:  Vitals:   04/27/19 0711  TempSrc: Oral  PainSc:          Complications: No apparent anesthesia complications

## 2019-04-27 NOTE — Anesthesia Postprocedure Evaluation (Signed)
Anesthesia Post Note Late Entry for 0815 [name]  Procedure(s) Performed: ESOPHAGOGASTRODUODENOSCOPY (EGD) WITH PROPOFOL (N/A )  Patient location during evaluation: PACU Anesthesia Type: MAC Level of consciousness: awake and alert and oriented Pain management: pain level controlled Vital Signs Assessment: post-procedure vital signs reviewed and stable Respiratory status: spontaneous breathing and respiratory function stable Cardiovascular status: stable Postop Assessment: no apparent nausea or vomiting Anesthetic complications: no     Last Vitals:  Vitals:   04/27/19 0830 04/27/19 0840  BP: (!) 85/57 (!) 99/56  Pulse: 64 71  Resp: 15 18  Temp:  36.7 C  SpO2: 97% 96%    Last Pain:  Vitals:   04/27/19 0840  TempSrc:   PainSc: 0-No pain                 Davionte Lusby A

## 2019-04-27 NOTE — Op Note (Signed)
[facility] Patient Name: Penny [name] Procedure Date: 04/27/2019 6:59 AM MRN: [id number] Date of Birth: 10/06/1986 Attending MD: Hildred Laser , MD CSN: [id number] Age: 33 Admit Type: Outpatient Procedure:                Upper GI endoscopy Indications:              Hematemesis Providers:                Hildred Laser, MD, Janeece Riggers, RN, Raphael Gibney,                            Technician, Randa Spike, Technician Referring MD:             Elayne Snare Luking, MD Medicines:                Propofol per Anesthesia Complications:            No immediate complications. Estimated Blood Loss:     Estimated blood loss: none. Procedure:                Pre-Anesthesia Assessment:                           - Prior to the procedure, a History and Physical                            was performed, and patient medications and                            allergies were reviewed. The patient's tolerance of                            previous anesthesia was also reviewed. The risks                            and benefits of the procedure and the sedation                            options and risks were discussed with the patient.                            All questions were answered, and informed consent                            was obtained. Prior Anticoagulants: The patient has                            taken no previous anticoagulant or antiplatelet                            agents. ASA Grade Assessment: III - A patient with                            severe systemic disease. After reviewing the risks  and benefits, the patient was deemed in                            satisfactory condition to undergo the procedure.                           After obtaining informed consent, the endoscope was                            passed under direct vision. Throughout the                            procedure, the patient's blood pressure, pulse, and       oxygen saturations were monitored continuously. The                            GIF-H190 ([id number]) scope was introduced through the                            mouth, and advanced to the mid-jejunum. The upper                            GI endoscopy was accomplished without difficulty.                            The patient tolerated the procedure well. Scope In: 7:45:03 AM Scope Out: 7:51:29 AM Total Procedure Duration: 0 hours 6 minutes 26 seconds  Findings:      The examined esophagus was normal.      The Z-line was regular and was found 41 cm from the incisors.      Evidence of a gastric bypass was found. A gastric pouch with a small       size was found containing bile. The gastrojejunal anastomosis was       characterized by ulceration. This was traversed.      The examined jejunum was normal. Impression:               - Normal esophagus.                           - Z-line regular, 41 cm from the incisors.                           - Gastric bypass with a small-sized pouch.                            Gastrojejunal anastomosis characterized by                            ulceration. Two ulcers. One along anterior wall is                            3 x 22mm. Other ulcer is posterior about 7 mm in  diameter. Both with clean base.                           - Normal examined jejunum.                           - No specimens collected. Moderate Sedation:      Per Anesthesia Care Recommendation:           - Patient has a contact number available for                            emergencies. The signs and symptoms of potential                            delayed complications were discussed with the                            patient. Return to normal activities tomorrow.                            Written discharge instructions were provided to the                            patient.                           - Resume previous diet today.                            - Increase Pantoprazole to 40 mg po bid but                            continue present medications.                           - Stool H. Pylori antigen.                           - Return to GI clinic in 2 months. Procedure Code(s):        --- Professional ---                           [phone], Esophagogastroduodenoscopy, flexible,                            transoral; diagnostic, including collection of                            specimen(s) by brushing or washing, when performed                            (separate procedure) Diagnosis Code(s):        --- Professional ---                           K28.9, Gastrojejunal ulcer, unspecified as acute or  chronic, without hemorrhage or perforation                           K92.0, Hematemesis CPT copyright 2019 American Medical Association. All rights reserved. The codes documented in this report are preliminary and upon coder review may  be revised to meet current compliance requirements. Hildred Laser, MD Hildred Laser, MD 04/27/2019 8:08:20 AM This report has been signed electronically. Number of Addenda: 0

## 2019-05-02 ENCOUNTER — Telehealth: Payer: Self-pay | Admitting: Adult Health

## 2019-05-02 ENCOUNTER — Encounter (HOSPITAL_COMMUNITY): Payer: Self-pay | Admitting: Internal Medicine

## 2019-05-02 NOTE — Telephone Encounter (Signed)
Left message @ 12:32 pm. JSY

## 2019-05-02 NOTE — Telephone Encounter (Signed)
Patient called stating that she is on antibiotics and she thinks it has given her a yeast infection ands she would like for Diflucan to be called in, pt states that she thinks she also has a flare up and would [facility] [place]. Please contact pt

## 2019-05-03 MED ORDER — VALACYCLOVIR HCL 1 G PO TABS: ORAL_TABLET | ORAL | 99 refills | Status: DC

## 2019-05-03 MED ORDER — FLUCONAZOLE 150 MG PO TABS: ORAL_TABLET | ORAL | 1 refills | Status: DC

## 2019-05-03 NOTE — Telephone Encounter (Signed)
Will refill valtrex and Rx diflucan

## 2019-05-03 NOTE — Telephone Encounter (Addendum)
Spoke with pt. Pt was prescribed antibiotics from ER. Pt thinks she has a yeast infection. Pt is requesting Diflucan. Also,  pt has been out of Valtrex x 1 month. Pt is requesting more Valtrex. Pt not sure if she's having a breakout. Please advise. Thanks!! [place]

## 2019-05-03 NOTE — Addendum Note (Signed)
Addended by: Derrek Monaco A on: 05/03/2019 09:30 AM   Modules accepted: Orders

## 2019-05-04 ENCOUNTER — Ambulatory Visit: Payer: Medicaid Other | Admitting: Family Medicine

## 2019-05-05 ENCOUNTER — Other Ambulatory Visit (HOSPITAL_COMMUNITY): Payer: Self-pay | Admitting: Psychiatry

## 2019-05-08 ENCOUNTER — Telehealth (HOSPITAL_COMMUNITY): Payer: Self-pay | Admitting: *Deleted

## 2019-05-08 ENCOUNTER — Other Ambulatory Visit (HOSPITAL_COMMUNITY): Payer: Self-pay | Admitting: Psychiatry

## 2019-05-08 NOTE — Telephone Encounter (Signed)
sent 

## 2019-05-08 NOTE — Telephone Encounter (Signed)
Dr Harrington Challenger Patient has called both Rx to check on her refills & neither has the Clonidine 0.2 mg. [facility] [place]

## 2019-05-08 NOTE — Telephone Encounter (Signed)
Yes she is still taking

## 2019-05-08 NOTE — Telephone Encounter (Signed)
Please call pt and see if she is still taking clonidine

## 2019-05-10 ENCOUNTER — Ambulatory Visit: Payer: Medicaid Other | Admitting: "Endocrinology

## 2019-05-15 ENCOUNTER — Encounter: Payer: Self-pay | Admitting: Family Medicine

## 2019-05-15 ENCOUNTER — Telehealth: Payer: Self-pay | Admitting: Family Medicine

## 2019-05-15 DIAGNOSIS — R002 Palpitations: Secondary | ICD-10-CM

## 2019-05-15 NOTE — Telephone Encounter (Signed)
Contacted patient. Pt is going to call father to see what Cardiologist he uses and then will give [name] a call back.

## 2019-05-15 NOTE — Telephone Encounter (Signed)
Pt returned call; pt would like to be referred to [facility]. Referral placed.

## 2019-05-15 NOTE — Telephone Encounter (Signed)
ok 

## 2019-05-15 NOTE — Addendum Note (Signed)
Addended by: Vicente Males on: 05/15/2019 02:03 PM   Modules accepted: Orders

## 2019-05-15 NOTE — Telephone Encounter (Signed)
Pt contacted office and would like a referral to cardiology. Pt states that she is still having issues with heart rate increasing, having shortness of breath when heart rate increases, dizzy spells and seeing black spots. 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 Pt is aware that Dr.Scott is out of the office this week. Please advise. Thank you

## 2019-05-18 ENCOUNTER — Other Ambulatory Visit: Payer: Self-pay

## 2019-05-18 ENCOUNTER — Other Ambulatory Visit (HOSPITAL_COMMUNITY): Payer: Self-pay | Admitting: Psychiatry

## 2019-05-18 ENCOUNTER — Encounter (HOSPITAL_COMMUNITY): Payer: Self-pay

## 2019-05-18 ENCOUNTER — Encounter (HOSPITAL_COMMUNITY)
Admission: RE | Admit: 2019-05-18 | Discharge: 2019-05-18 | Disposition: A | Discharge status: Home / Self Care | Payer: Medicaid Other | Source: Ambulatory Visit | Attending: Internal Medicine | Admitting: Internal Medicine

## 2019-05-18 DIAGNOSIS — D508 Other iron deficiency anemias: Secondary | ICD-10-CM | POA: Insufficient documentation

## 2019-05-18 LAB — POCT HEMOGLOBIN-HEMACUE: Hemoglobin: 9.6 g/dL — ABNORMAL LOW (ref 12.0–15.0)

## 2019-05-18 MED ORDER — SODIUM CHLORIDE 0.9 % IV SOLN
750.0000 mg | Freq: Once | INTRAVENOUS | Status: AC
  Administered 2019-05-18: 750 mg via INTRAVENOUS
  Filled 2019-05-18: qty 15

## 2019-05-18 MED ORDER — SODIUM CHLORIDE 0.9 % IV SOLN
Freq: Once | INTRAVENOUS | Status: AC
  Administered 2019-05-18: 12:00:00 via INTRAVENOUS

## 2019-05-18 NOTE — Progress Notes (Signed)
Upon completion of injectafer, patient c/o dizziness and feeling light headed.  IV fluids continued to infuse, applied O2 and provided with drink and crackers.  HR variable 105-120.  Dr Laural Golden notified.  Patient watched for an additional 30 minutes.  Unpon discharge, states and appears as she is feeling at her baseline.  VS WNL.  Advised to seek medical attention, per Dr. Laural Golden if she feels it is necessary.  Verbalized understanding.  No acute distress noted.

## 2019-05-20 ENCOUNTER — Other Ambulatory Visit (HOSPITAL_COMMUNITY): Payer: Self-pay | Admitting: Psychiatry

## 2019-05-21 ENCOUNTER — Other Ambulatory Visit: Payer: Self-pay | Admitting: Family Medicine

## 2019-05-21 ENCOUNTER — Encounter (INDEPENDENT_AMBULATORY_CARE_PROVIDER_SITE_OTHER): Payer: Self-pay | Admitting: *Deleted

## 2019-05-21 ENCOUNTER — Other Ambulatory Visit (HOSPITAL_COMMUNITY): Payer: Self-pay | Admitting: Psychiatry

## 2019-05-21 ENCOUNTER — Telehealth (HOSPITAL_COMMUNITY): Payer: Self-pay

## 2019-05-21 ENCOUNTER — Other Ambulatory Visit (INDEPENDENT_AMBULATORY_CARE_PROVIDER_SITE_OTHER): Payer: Self-pay | Admitting: *Deleted

## 2019-05-21 DIAGNOSIS — E611 Iron deficiency: Secondary | ICD-10-CM

## 2019-05-21 MED ORDER — CLONAZEPAM 0.5 MG PO TABS: 0.5000 mg | ORAL_TABLET | Freq: Three times a day (TID) | ORAL | 1 refills | Status: DC | PRN

## 2019-05-21 NOTE — Telephone Encounter (Signed)
Medication management - Telephone call with patient to inform Dr. Harrington Challenger did send in her requested refill order of Clonzepam.  Informed this was early and would be the last time she would send in a Clonazepam order early as patient stated understanding.

## 2019-05-21 NOTE — Telephone Encounter (Signed)
Medication refill - Patient called the office back a second time today stating that Dr. Harrington Challenger had sent in a new Cymbalta order for her but that she is also in need of a new Clonazepam order and does not have enough until 05/25/19.  Patient requests Dr. Harrington Challenger send [facility] [place] prior to upcoming appt and agreed to send request to Dr. Harrington Challenger.

## 2019-05-21 NOTE — Telephone Encounter (Signed)
Sent but it is 6 days early, I will not do this again for her

## 2019-05-21 NOTE — Telephone Encounter (Signed)
Medication management - telephone call with pt to follow up on when she has an appointment with Dr. Harrington Challenger and her need for a refill of Cymbalta. Informed Dr. Harrington Challenger had sent [facility] already today and she is scheduled for 05/25/19 at 10:40am. Patient reported no other concerns and informed this appointment on Friday would be a virtual visit.  Informed patient if she had not been contacted by 11:00am that morning to give our office a call before the providers leave at 12:00 noon and patient agreed with plan.
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
## 2019-05-24 ENCOUNTER — Encounter (HOSPITAL_COMMUNITY): Payer: Self-pay

## 2019-05-24 ENCOUNTER — Other Ambulatory Visit: Payer: Self-pay

## 2019-05-24 ENCOUNTER — Encounter (HOSPITAL_COMMUNITY)
Admission: RE | Admit: 2019-05-24 | Discharge: 2019-05-24 | Disposition: A | Discharge status: Home / Self Care | Payer: Medicaid Other | Source: Ambulatory Visit | Attending: Internal Medicine | Admitting: Internal Medicine

## 2019-05-24 DIAGNOSIS — D508 Other iron deficiency anemias: Secondary | ICD-10-CM | POA: Diagnosis not present

## 2019-05-24 MED ORDER — SODIUM CHLORIDE 0.9 % IV SOLN
750.0000 mg | Freq: Once | INTRAVENOUS | Status: AC
  Administered 2019-05-24: 750 mg via INTRAVENOUS
  Filled 2019-05-24: qty 15

## 2019-05-24 MED ORDER — SODIUM CHLORIDE 0.9 % IV SOLN
Freq: Once | INTRAVENOUS | Status: AC
  Administered 2019-05-24: 11:00:00 via INTRAVENOUS

## 2019-05-25 ENCOUNTER — Ambulatory Visit (INDEPENDENT_AMBULATORY_CARE_PROVIDER_SITE_OTHER): Payer: Medicaid Other | Admitting: Psychiatry

## 2019-05-25 ENCOUNTER — Other Ambulatory Visit (HOSPITAL_COMMUNITY)
Admission: RE | Admit: 2019-05-25 | Discharge: 2019-05-25 | Disposition: A | Discharge status: Home / Self Care | Payer: Medicaid Other | Source: Ambulatory Visit | Attending: Cardiovascular Disease | Admitting: Cardiovascular Disease

## 2019-05-25 ENCOUNTER — Ambulatory Visit (INDEPENDENT_AMBULATORY_CARE_PROVIDER_SITE_OTHER): Payer: Medicaid Other | Admitting: Cardiovascular Disease

## 2019-05-25 ENCOUNTER — Encounter (HOSPITAL_COMMUNITY): Payer: Self-pay | Admitting: Psychiatry

## 2019-05-25 VITALS — BP 110/78 | HR 120 | Temp 97.7°F | Wt 189.0 lb

## 2019-05-25 DIAGNOSIS — F900 Attention-deficit hyperactivity disorder, predominantly inattentive type: Secondary | ICD-10-CM

## 2019-05-25 DIAGNOSIS — F411 Generalized anxiety disorder: Secondary | ICD-10-CM

## 2019-05-25 DIAGNOSIS — R002 Palpitations: Secondary | ICD-10-CM | POA: Insufficient documentation

## 2019-05-25 DIAGNOSIS — F332 Major depressive disorder, recurrent severe without psychotic features: Secondary | ICD-10-CM | POA: Diagnosis not present

## 2019-05-25 LAB — TSH: TSH: 1.592 u[IU]/mL (ref 0.350–4.500)

## 2019-05-25 MED ORDER — CARBAMAZEPINE 200 MG PO TABS: 400.0000 mg | ORAL_TABLET | Freq: Every day | ORAL | 2 refills | Status: DC

## 2019-05-25 MED ORDER — DULOXETINE HCL 60 MG PO CPEP: 60.0000 mg | ORAL_CAPSULE | Freq: Every day | ORAL | 2 refills | Status: DC

## 2019-05-25 MED ORDER — AMPHETAMINE-DEXTROAMPHET ER 30 MG PO CP24: 30.0000 mg | ORAL_CAPSULE | Freq: Every day | ORAL | 0 refills | Status: DC

## 2019-05-25 MED ORDER — ZOLPIDEM TARTRATE 10 MG PO TABS: 10.0000 mg | ORAL_TABLET | Freq: Every day | ORAL | 2 refills | Status: DC

## 2019-05-25 MED ORDER — AMPHETAMINE-DEXTROAMPHET ER 30 MG PO CP24: ORAL_CAPSULE | ORAL | 0 refills | Status: DC

## 2019-05-25 NOTE — Progress Notes (Signed)
Virtual Visit via Video Note  I connected with Penny [name] on 05/25/19 at 10:40 AM EDT by a video enabled telemedicine application and verified that I am speaking with the correct person using two identifiers.   I discussed the limitations of evaluation and management by telemedicine and the availability of in person appointments. The patient expressed understanding and agreed to proceed.      I discussed the assessment and treatment plan with the patient. The patient was provided an opportunity to ask questions and all were answered. The patient agreed with the plan and demonstrated an understanding of the instructions.   The patient was advised to call back or seek an in-person evaluation if the symptoms worsen or if the condition fails to improve as anticipated.  I provided 15 minutes of non-face-to-face time during this encounter.   Penny Spiller, MD  [facility] MD/PA/NP OP Progress Note  05/25/2019 11:08 AM ANALY BASSFORD  MRN:  [id number]  Chief Complaint:  Chief Complaint    Depression; Anxiety; ADHD; Follow-up     HPI: Patient is a 65 year old white female who living with her boyfriend, 15-year-old daughter and her boyfriend's 2 daughters.  They were in [place] but recently moved back to [place] to stay with her parents.  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  The patient returns after 2 months for follow-up regarding mood swings anxiety and ADHD and depression.  She states that she has had anemia and has had 2 iron infusions and is starting to feel better and her energy is perking up.  She still not sleeping well at night.  We have tried clonidine and mirtazapine and these have not worked trazodone and Belsomra did not work.  She states she thinks Ambien worked the best so we will go back to this.  She claims that the clonazepam helps her anxiety and Adderall XR helps her to focus.  She denies severe depression suicidal  ideation or thoughts of self-harm.  She states that she is still having a lot of trouble with her back but is not currently on pain medication.  She is going to be seeing a new orthopedic. Visit Diagnosis:    ICD-10-CM   1. Major depressive disorder, recurrent episode, 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[place])  F33.2   2. GAD (generalized anxiety disorder)  F41.1   3. Attention deficit hyperactivity disorder (ADHD), predominantly inattentive type  F90.0     Past Psychiatric History: Psychiatric hospitalization last year  Past Medical History:  Past Medical History:  Diagnosis Date  . ADD (attention deficit disorder)   . Anemia 2011   2o to GASTRIC BYPASS  . Anginal pain ([place])   . Anxiety   . Blood transfusion without reported diagnosis   . Chronic daily headache   . Depression   . Depression   . Dysmenorrhea 09/18/2013  . Dysrhythmia   . Elevated liver enzymes JUL 2011ALK PHOS 111-127 AST  143-267 ALT  213-321T BILI 0.6  ALB  3.7-4.06 Jun 2011 ALK PHOS 118 AST 24 ALT 42 T BILI 0.4 ALB 3.9  . Encounter for drug rehabilitation    at behavioral health for opioid addiction about 4 years ago  . Family history of adverse reaction to anesthesia    'dad had to be kept on pump for breathing for morphine'  . Fatty liver   . Fibroid 01/18/2017  . Fibromyalgia   . Gastric  bypass status for obesity   . Gastritis JULY 2011  . Heart murmur   . 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 History of Holter monitoring   . Hx of opioid abuse ([place])    for about 4 years, about 4 years ago  . Interstitial cystitis   . Iron deficiency anemia 07/23/2010  . Irritable bowel syndrome 2012 DIARRHEA   JUN 2012 TTG IgA 14.9  . IUD FEB 2010  . Lupus ([place])   . Menorrhagia 07/18/2013  . Migraines   . Obesity (BMI 30-39.9) 2011 228 LBS BMI 36.8  . Ovarian cyst   . Patient desires pregnancy 09/18/2013  . Polyneuropathy   . PONV (postoperative nausea and vomiting) seizure post-operatively    seizure following ankle surgery  . Potassium (K) deficiency   . Pregnant 12/25/2013  . Psychiatric pseudoseizure   . RLQ abdominal pain 07/18/2013  . Sciatica of left side 11/14/2014  . 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 Stress 09/18/2013    Past Surgical History:  Procedure Laterality Date  . BIOPSY  04/10/2015   Procedure: BIOPSY;  Surgeon: Daneil Dolin, MD;  Location: [facility];  Service: Endoscopy;;  . cath self every nite     for sodium bicarb injection (discontinued 2013)  . CHOLECYSTECTOMY  2005   biliary dyskinesia  . COLONOSCOPY  JUN 2012 ABD PN/DIARRHEA WITH PROPOFOL   NL COLON  . DILATION AND CURETTAGE OF UTERUS    . DILITATION & CURRETTAGE/HYSTROSCOPY WITH NOVASURE ABLATION N/A 03/24/2017   Procedure: DILATATION & CURETTAGE/HYSTEROSCOPY WITH NOVASURE ENDOMETRIAL ABLATION;  Surgeon: Jonnie Kind, MD;  Location: [facility];  Service: Gynecology;  Laterality: N/A;  . ESOPHAGEAL DILATION N/A 04/10/2015   Procedure: ESOPHAGEAL DILATION WITH 54FR MALONEY DILATOR;  Surgeon: Daneil Dolin, MD;  Location: [facility];  Service: Endoscopy;  Laterality: N/A;  . ESOPHAGOGASTRODUODENOSCOPY    . ESOPHAGOGASTRODUODENOSCOPY (EGD) WITH PROPOFOL N/A 04/10/2015   Procedure: ESOPHAGOGASTRODUODENOSCOPY (EGD) WITH PROPOFOL;  Surgeon: Daneil Dolin, MD;  Location: [facility];  Service: Endoscopy;  Laterality: N/A;  . ESOPHAGOGASTRODUODENOSCOPY (EGD) WITH PROPOFOL N/A 12/06/2016   Procedure: ESOPHAGOGASTRODUODENOSCOPY (EGD) WITH PROPOFOL;  Surgeon: Danie Binder, MD;  [facility];  Service: Endoscopy;  Laterality: N/A;  . ESOPHAGOGASTRODUODENOSCOPY (EGD) WITH PROPOFOL N/A 04/27/2019   Procedure: ESOPHAGOGASTRODUODENOSCOPY (EGD) WITH PROPOFOL;  Surgeon: Rogene Houston, MD;  [facility];  Service: Endoscopy;  Laterality: N/A;  730  . GAB  2007   in [place]-POUCH 5 CM  . GASTRIC BYPASS  06/2006  . HYSTEROSCOPY W/D&C N/A 09/12/2014   Procedure: DILATATION AND CURETTAGE  /HYSTEROSCOPY;  Surgeon: Jonnie Kind, MD;  Location: [facility];  Service: Gynecology;  Laterality: N/A;  . KYPHOPLASTY N/A 08/02/2018   Procedure: KYPHOPLASTY T12;  Surgeon: Melina Schools, MD;  Location: [facility];  Service: Orthopedics;  Laterality: N/A;  60 mins  . LAPAROSCOPIC TUBAL LIGATION Bilateral 03/24/2017   Procedure: LAPAROSCOPIC TUBAL LIGATION ([place] Rings);  Surgeon: Jonnie Kind, MD;  Location: [facility];  Service: Gynecology;  Laterality: Bilateral;  . REPAIR VAGINAL CUFF N/A 07/30/2014   Procedure: REPAIR VAGINAL CUFF;  Surgeon: Mora Bellman, MD;  Location: [facility];  Service: Gynecology;  Laterality: N/A;  . SAVORY DILATION  06/20/2012   Dr. Barnie Alderman gastritis/Ulcer in the mid jejunum. Empiric dilation.   . SURAL NERVE BX Left 02/25/2016   Procedure: LEFT SURAL NERVE BIOPSY;  Surgeon: Jovita Gamma, MD;  Location: [facility];  Service:  Neurosurgery;  Laterality: Left;  Left sural nerve biopsy  . TONSILLECTOMY    . TONSILLECTOMY AND ADENOIDECTOMY    . UPPER GASTROINTESTINAL ENDOSCOPY  JULY 2011 NAUSEA-D125,V6, PH 25   Bx; GASTRITIS, POUCH-5 CM LONG  . WISDOM TOOTH EXTRACTION      Family Psychiatric History: see below  Family History:  Family History  Problem Relation Age of Onset  . Hemochromatosis Maternal Grandmother   . Migraines Maternal Grandmother   . Cancer Maternal Grandmother   . Breast cancer Maternal Grandmother   . Hypertension Father   . Diabetes Father   . Coronary artery disease Father   . Migraines Paternal Grandmother   . Breast cancer Paternal Grandmother   . Cancer Mother        breast  . Hemochromatosis Mother   . Breast cancer Mother   . Depression Mother   . Anxiety disorder Mother   . Coronary artery disease Paternal Grandfather   . Anxiety disorder Brother   . Bipolar disorder Brother   . Healthy Daughter   . Healthy Son     Social History:  Social History   Socioeconomic History  . Marital status: Divorced    Spouse name: Not  on file  . Number of children: 2  . Years of education: some colle  . Highest education level: Not on file  Occupational History    Employer: NOT EMPLOYED  Social Needs  . Financial resource strain: Very hard  . Food insecurity    Worry: Never true    Inability: Never true  . Transportation needs    Medical: No    Non-medical: No  Tobacco Use  . Smoking status: Current Every Day Smoker    Packs/day: 0.25    Years: 6.00    Pack years: 1.50    Types: E-cigarettes    Start date: 05/2018  . Smokeless tobacco: Never Used  Substance and Sexual Activity  . Alcohol use: Yes    Alcohol/week: 0.0 standard drinks    Comment: 1-2 beers a couple times a month   . Drug use: Not Currently    Types: Marijuana    Comment: Previously opioid addiction went through rehab  . Sexual activity: Yes    Partners: Male    Birth control/protection: Surgical, Condom    Comment: tubal and ablation  Lifestyle  . Physical activity    Days per week: 0 days    Minutes per session: 0 min  . Stress: Very much  Relationships  . Social connections    Talks on phone: More than three times a week    Gets together: More than three times a week    Attends religious service: More than 4 times per year    Active member of club or organization: No    Attends meetings of clubs or organizations: Never    Relationship status: Divorced  Other Topics Concern  . Not on file  Social History Narrative   Patient is right handed.   Patient drinks one cup of coffee daily.   She lives in a one-story home with her two children (60 year old son, 56 month old daughter).   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 Gabapentin Other (See Comments)    Pt states that she was unresponsive after taking this medication, but her vitals remained stable.    . Metoclopramide Hcl  Anxiety and Other (See Comments)    Pt states that she  felt like she was trapped in a box, and could not get out.  Pt also states that she had temporary loss of movement, weakness, and tingling.    . Tramadol Other (See Comments)    Seizures  . Ativan [Lorazepam] Other (See Comments)    combative  . Latex Itching, Rash and Other (See Comments)    Burns  . Lyrica [Pregabalin] Other (See Comments)    suicidal  . Trazodone And Nefazodone Other (See Comments)    Makes pt "like a zombie"  . Zofran [Ondansetron] Other (See Comments)    Migraines in high doses    . Sulfa Antibiotics Hives  . Butrans [Buprenorphine] Rash    Patch caused rash, didn't help with pain  . Nucynta [Tapentadol] Nausea And Vomiting  . Propofol Nausea And Vomiting  . Tape Itching, Rash and Other (See Comments)    Please use paper tape  . Toradol [Ketorolac Tromethamine] Anxiety    Metabolic Disorder Labs: Lab Results  Component Value Date   HGBA1C 5.7 (H) 01/15/2015   Lab Results  Component Value Date   PROLACTIN 80.0 (H) 05/09/2015   Lab Results  Component Value Date   CHOL 95 03/10/2012   TRIG 50 03/10/2012   HDL 38 (L) 03/10/2012   CHOLHDL 2.5 03/10/2012   VLDL 10 03/10/2012   LDLCALC 47 03/10/2012   Lab Results  Component Value Date   TSH 1.780 12/14/2018   TSH 0.977 11/26/2015    Therapeutic Level Labs: No results found for: LITHIUM No results found for: VALPROATE No components found for:  CBMZ  Current Medications: Current Outpatient Medications  Medication Sig Dispense Refill  . amphetamine-dextroamphetamine (ADDERALL XR) 30 MG 24 hr capsule TAKE (1) CAPSULE BY MOUTH EVERY DAY. 30 capsule 0  . amphetamine-dextroamphetamine (ADDERALL XR) 30 MG 24 hr capsule Take 1 capsule (30 mg total) by mouth daily. 30 capsule 0  . carbamazepine (TEGRETOL) 200 MG tablet Take 2 tablets (400 mg total) by mouth at bedtime. 60 tablet 2  . clonazePAM (KLONOPIN) 0.5 MG tablet Take 1 tablet (0.5 mg total) by mouth 3 (three) times daily as needed for anxiety. 90  tablet 1  . DULoxetine (CYMBALTA) 60 MG capsule Take 1 capsule (60 mg total) by mouth at bedtime. 90 capsule 2  . fluconazole (DIFLUCAN) 150 MG tablet Take 1 now and repeat 1 in 3 days 2 tablet 1  . pantoprazole (PROTONIX) 40 MG tablet Take 1 tablet (40 mg total) by mouth 2 (two) times daily before a meal. 60 tablet 3  . phenazopyridine (PYRIDIUM) 200 MG tablet TAKE 1 TABLET BY MOUTH THREE TIMES A DAY AS NEEDED FOR PAIN 15 tablet 0  . PROAIR HFA 108 (90 Base) MCG/ACT inhaler INHALE 2 PUFFS INTO THE LUNGS EVERY 6 HOURS AS NEEDED. (Patient taking differently: Inhale 2 puffs into the lungs every 6 (six) hours as needed for wheezing or shortness of breath. ) 8.5 g 0  . promethazine (PHENERGAN) 25 MG tablet TAKE ONE TABLET BY MOUTH EVERY 6 HOURS AS NEEDED. (Patient taking differently: Take 25 mg by mouth every 6 (six) hours as needed for nausea or vomiting. ) 30 tablet 0  . topiramate (TOPAMAX) 50 MG tablet Take one in the am and 2 at night 90 tablet 2  . valACYclovir (VALTREX) 1000 MG tablet Take 1 bid for 10 days then Take  1 daily for suppression 90 tablet prn  .  zolpidem (AMBIEN) 10 MG tablet Take 1 tablet (10 mg total) by mouth at bedtime for 30 days. 30 tablet 2   No current facility-administered medications for this visit.      Musculoskeletal: Strength & Muscle Tone: within normal limits Gait & Station: normal Patient leans: N/A  Psychiatric Specialty Exam: Review of Systems  Constitutional: Positive for malaise/fatigue.  Musculoskeletal: Positive for back pain.  Psychiatric/Behavioral: The patient has insomnia.   All other systems reviewed and are negative.   There were no vitals taken for this visit.There is no height or weight on file to calculate BMI.  General Appearance: Casual and Fairly Groomed  Eye Contact:  Good  Speech:  Clear and Coherent  Volume:  Normal  Mood:  Euthymic  Affect:  Appropriate and Congruent  Thought Process:  Goal Directed  Orientation:  Full (Time,  Place, and Person)  Thought Content: Rumination   Suicidal Thoughts:  No  Homicidal Thoughts:  No  Memory:  Immediate;   Good Recent;   Good Remote;   Good  Judgement:  Fair  Insight:  Shallow  Psychomotor Activity:  Decreased  Concentration:  Concentration: Good and Attention Span: Good  Recall:  Good  Fund of Knowledge: Good  Language: Good  Akathisia:  No  Handed:  Right  AIMS (if indicated): not done  Assets:  Communication Skills Desire for Improvement Resilience Social Support Talents/Skills  ADL's:  Intact  Cognition: WNL  Sleep:  Poor   Screenings: AUDIT     ED to Hosp-Admission (Discharged) from 05/09/2015 in [facility]  Alcohol Use Disorder Identification Test Final Score (AUDIT)  0    GAD-7     Counselor from 12/11/2018 in [facility] Counselor from 02/07/2017 in [facility]  Total GAD-7 Score  19  17    PHQ2-9     Counselor from 12/11/2018 in [facility] Counselor from 07/22/2017 in [facility] Counselor from 02/07/2017 in [facility] Visit from 01/10/2017 in [facility] Counselor from 10/06/2016 in [facility]  PHQ-2 Total Score  _0 PHQ-9 Total Score  _1 Assessment and Plan:  This patient is a 33 year old female with a history of depression anxiety and ADHD.  She does have some elements of borderline personality as well.  For now she seems to be doing okay except for sleep.  She will continue Tegretol 400 mg at bedtime for mood stabilization, Cymbalta 60 mg at bedtime first depression, Ambien 10 mg at bedtime will be added for sleep and she will continue clonazepam 0.5 mg 3 times daily as needed for anxiety and Adderall XR 30 mg daily for focus.   She will return to see me in 2 months  Penny Spiller, MD 05/25/2019, 11:08 AM

## 2019-05-25 NOTE — Patient Instructions (Signed)
Medication Instructions: Your physician recommends that you continue on your current medications as directed. Please refer to the Current Medication list given to you today.  Labwork: TSH, T4 today  Procedures/Testing: Your physician has requested that you have an echocardiogram. Echocardiography is a painless test that uses sound waves to create images of your heart. It provides your doctor with information about the size and shape of your heart and how well your heart's chambers and valves are working. This procedure takes approximately one hour. There are no restrictions for this procedure.  Your physician has recommended that you wear an event monitor for 14 days. Event monitors are medical devices that record the heart's electrical activity. Doctors most often [name] these monitors to diagnose arrhythmias. Arrhythmias are problems with the speed or rhythm of the heartbeat. The monitor is a small, portable device. You can wear one while you do your normal daily activities. This is usually used to diagnose what is causing palpitations/syncope (passing out).    Follow-Up: Dr.Nishan in [place] after tests  Any Additional Special Instructions Will Be Listed Below (If Applicable).     If you need a refill on your cardiac medications before your next appointment, please call your pharmacy.       Thank you for choosing [facility] !

## 2019-05-26 LAB — T4: T4, Total: 6.6 ug/dL (ref 4.5–12.0)

## 2019-05-28 ENCOUNTER — Ambulatory Visit (INDEPENDENT_AMBULATORY_CARE_PROVIDER_SITE_OTHER): Payer: Medicaid Other

## 2019-05-28 DIAGNOSIS — R002 Palpitations: Secondary | ICD-10-CM | POA: Diagnosis not present

## 2019-05-30 ENCOUNTER — Other Ambulatory Visit (HOSPITAL_COMMUNITY): Payer: Medicaid Other

## 2019-05-30 ENCOUNTER — Other Ambulatory Visit (HOSPITAL_COMMUNITY): Payer: Self-pay | Admitting: Psychiatry

## 2019-06-07 ENCOUNTER — Inpatient Hospital Stay (HOSPITAL_COMMUNITY): Admission: RE | Admit: 2019-06-07 | Payer: Medicaid Other | Source: Ambulatory Visit

## 2019-06-16 ENCOUNTER — Other Ambulatory Visit (HOSPITAL_COMMUNITY): Payer: Self-pay | Admitting: Psychiatry
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
## 2019-06-19 ENCOUNTER — Ambulatory Visit: Payer: Medicaid Other | Admitting: Cardiovascular Disease

## 2019-06-26 ENCOUNTER — Other Ambulatory Visit: Payer: Self-pay

## 2019-06-28 DIAGNOSIS — R309 Painful micturition, unspecified: Secondary | ICD-10-CM | POA: Diagnosis not present

## 2019-06-28 DIAGNOSIS — N390 Urinary tract infection, site not specified: Secondary | ICD-10-CM | POA: Diagnosis not present

## 2019-06-30 ENCOUNTER — Other Ambulatory Visit (HOSPITAL_COMMUNITY): Payer: Self-pay | Admitting: Psychiatry

## 2019-07-02 ENCOUNTER — Telehealth (HOSPITAL_COMMUNITY): Payer: Self-pay

## 2019-07-02 ENCOUNTER — Ambulatory Visit (INDEPENDENT_AMBULATORY_CARE_PROVIDER_SITE_OTHER): Payer: Medicaid Other | Admitting: Internal Medicine

## 2019-07-02 NOTE — Telephone Encounter (Signed)
Medication management - Faxed Dr. Harrington Challenger completed prior authorization request for Zolpidem Tartrate back to [facility] Tr[facility] for Medicaid medication approval.  Waiting on decision.

## 2019-07-04 ENCOUNTER — Telehealth (HOSPITAL_COMMUNITY): Payer: Self-pay | Admitting: *Deleted

## 2019-07-04 NOTE — Telephone Encounter (Signed)
SEDATIVE HYPNOTIC APPROVED ZOLPIDEM TARTRATE 10 MG  TABLET  P.A. #:  [id number]  EFFECTIVE : 07/04/2019  THRU 12-31-2019  [place]  [facility]  NOTIFIED [facility] PHARMACIST

## 2019-07-14 ENCOUNTER — Other Ambulatory Visit: Payer: Self-pay

## 2019-07-14 ENCOUNTER — Encounter: Payer: Self-pay | Admitting: Emergency Medicine

## 2019-07-14 ENCOUNTER — Emergency Department (INDEPENDENT_AMBULATORY_CARE_PROVIDER_SITE_OTHER)
Admission: EM | Admit: 2019-07-14 | Discharge: 2019-07-14 | Disposition: A | Discharge status: Home / Self Care | Payer: Medicaid Other | Source: Home / Self Care

## 2019-07-14 DIAGNOSIS — M5442 Lumbago with sciatica, left side: Secondary | ICD-10-CM | POA: Diagnosis not present

## 2019-07-14 DIAGNOSIS — M5441 Lumbago with sciatica, right side: Secondary | ICD-10-CM

## 2019-07-14 LAB — POCT URINALYSIS DIP (MANUAL ENTRY)
Bilirubin, UA: NEGATIVE
Blood, UA: NEGATIVE
Glucose, UA: NEGATIVE mg/dL
Ketones, POC UA: NEGATIVE mg/dL
Leukocytes, UA: NEGATIVE
Nitrite, UA: NEGATIVE
Protein Ur, POC: NEGATIVE mg/dL
Spec Grav, UA: 1.015 (ref 1.010–1.025)
Urobilinogen, UA: 0.2 E.U./dL
pH, UA: 6.5 (ref 5.0–8.0)

## 2019-07-14 MED ORDER — ZIKS ARTHRITIS PAIN RELIEF 0.025-1-12 % EX CREA: 1.0000 "application " | TOPICAL_CREAM | Freq: Four times a day (QID) | CUTANEOUS | 0 refills | Status: DC | PRN

## 2019-07-14 MED ORDER — CYCLOBENZAPRINE HCL 5 MG PO TABS: 5.0000 mg | ORAL_TABLET | Freq: Two times a day (BID) | ORAL | 0 refills | Status: DC | PRN

## 2019-07-14 MED ORDER — METHYLPREDNISOLONE SODIUM SUCC 40 MG IJ SOLR
80.0000 mg | Freq: Once | INTRAMUSCULAR | Status: AC
  Administered 2019-07-14: 80 mg via INTRAMUSCULAR

## 2019-07-14 NOTE — Discharge Instructions (Signed)
°  Flexeril (cyclobenzaprine) is a muscle relaxer and may cause drowsiness. Do not drink alcohol, drive, or operate heavy machinery while taking.  Please follow up with Sports Medicine or your family doctor this week for further evaluation and treatment of low back pain.  Call 911 or go to the hospital if symptoms worsening.

## 2019-07-14 NOTE — ED Provider Notes (Addendum)
Penny [name] CARE    CSN: [id number] Arrival date & time: 07/14/19  1322     History   Chief Complaint Chief Complaint  Patient presents with  . Back Pain    HPI Penny [name] is a 33 y.o. female.   HPI Penny [name] is a 33 y.o. female presenting to UC with c/o 3 days of gradually worsening low back pain with muscle spasms. Hx of chronic low back pain after fracturing her back and having surgery last year.  Pain is 8/10, radiates into both upper legs but no weakness or numbness in arms or legs. Denies urinary symptoms but would like her urine checked to make sure that is not causing her pain. Denies fever, chills, n/v/d. No injuries. She has tried tenazidine, ibuprofen and acetaminophen w/o relief.    Past Medical History:  Diagnosis Date  . ADD (attention deficit disorder)   . Anemia 2011   2o to GASTRIC BYPASS  . Anginal pain ([place])   . Anxiety   . Blood transfusion without reported diagnosis   . Chronic daily headache   . Depression   . Depression   . Dysmenorrhea 09/18/2013  . Dysrhythmia   . Elevated liver enzymes JUL 2011ALK PHOS 111-127 AST  143-267 ALT  213-321T BILI 0.6  ALB  3.7-4.06 Jun 2011 ALK PHOS 118 AST 24 ALT 42 T BILI 0.4 ALB 3.9  . Encounter for drug rehabilitation    at behavioral health for opioid addiction about 4 years ago  . Family history of adverse reaction to anesthesia    'dad had to be kept on pump for breathing for morphine'  . Fatty liver   . Fibroid 01/18/2017  . Fibromyalgia   . Gastric bypass status for obesity   . Gastritis JULY 2011  . Heart murmur   . 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 History of Holter monitoring   . Hx of opioid abuse ([place])    for about 4 years, about 4 years ago  . Interstitial cystitis   . Iron deficiency anemia 07/23/2010  . Irritable bowel syndrome 2012 DIARRHEA   JUN 2012 TTG IgA 14.9  . IUD FEB 2010  . Lupus ([place])   . Menorrhagia 07/18/2013  .  Migraines   . Obesity (BMI 30-39.9) 2011 228 LBS BMI 36.8  . Ovarian cyst   . Patient desires pregnancy 09/18/2013  . Polyneuropathy   . PONV (postoperative nausea and vomiting) seizure post-operatively   seizure following ankle surgery  . Potassium (K) deficiency   . Pregnant 12/25/2013  . Psychiatric pseudoseizure   . RLQ abdominal pain 07/18/2013  . Sciatica of left side 11/14/2014  . 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 Stress 09/18/2013    Patient Active Problem List   Diagnosis Date Noted  . Hematemesis with nausea 04/23/2019  . Absolute anemia 04/23/2019  . Urge incontinence 10/26/2018  . S/P kyphoplasty 08/02/2018  . Herpes genitalis in women 12/19/2017  . Abnormal urine odor 03/30/2017  . Urinary tract infection with hematuria 03/30/2017  . Screening for genitourinary condition 03/30/2017  . Pain 03/30/2017  . Burning with urination 03/30/2017  . Family history of breast cancer in mother,uncertain BR CA status 02/28/2017  . Fibroid 01/18/2017  . Chronic pain syndrome 12/05/2016  . Folate deficiency 12/04/2016  . Nausea vomiting and diarrhea 12/04/2016  . Severe anemia 12/04/2016  . Symptomatic anemia 12/03/2016  . Complaints of leg weakness   . Paresthesia   .  Left-sided weakness   . Uncontrolled pain 01/24/2016  . Malnutrition of moderate degree 01/24/2016  . Neuropathy 01/23/2016  . Inability to walk 01/23/2016  . Paresthesias   . Bilateral leg numbness 11/26/2015  . 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 Post partum depression 05/09/2015  . Anastomotic ulcer   . Dysphagia   . Hematemesis 04/07/2015  . Intractable vomiting with nausea 04/07/2015  . Elevated liver enzymes   . Epigastric pain   . Pseudoseizure ([place]) 02/22/2015  . Seizure-like activity ([place]) 02/22/2015  . Fibromyalgia 02/18/2015  . Vulvar fissure 02/18/2015  . Clinical depression 02/01/2015  . Current smoker 01/29/2015  .  Current tobacco use 01/29/2015  . 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 Leg weakness, bilateral 01/02/2015  . Gait disorder 01/02/2015  . Other symptoms and signs involving the musculoskeletal system 01/02/2015  . Abnormal gait 01/02/2015  . Cervicalgia 12/18/2014  . Sciatica of left side 11/14/2014  . Neuralgia neuritis, sciatic nerve 11/14/2014  . 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 Encounter for sterilization 09/09/2014  . Abdominal pain, acute, right lower quadrant 08/22/2014  . Headache, migraine 08/19/2014  . Seizure ([place]) 08/18/2014  . Encephalopathy 08/13/2014  . Headache 08/13/2014  . Altered mental status 08/13/2014  . Right leg numbness 07/31/2014  . Disturbance of skin sensation 07/31/2014  . Hypertension in pregnancy, transient 07/08/2014  . Previous gastric bypass affecting pregnancy, antepartum 05/22/2014  . 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 ASD (atrial septal defect), ostium secundum 05/17/2014  . Depression complicating pregnancy in second trimester, antepartum 05/09/2014  . Antepartum mental disorder in pregnancy 05/09/2014  . Rapid palpitations 02/18/2014  . H/O maternal third degree perineal laceration, currently pregnant 02/18/2014  . High-risk pregnancy 02/18/2014  . Supervision of pregnancy with other poor reproductive or obstetric history, unspecified trimester 02/18/2014  . Restless legs 01/16/2014  . Restless leg 01/16/2014  . Chronic interstitial cystitis 10/23/2013  . Menorrhagia 07/18/2013  . Excessive and frequent menstruation 07/18/2013  . h/o Opiate addiction 03/11/2012    Class: Acute  . History of migraine headaches 03/10/2012    Class: Acute  . Depression with anxiety 03/10/2012    Class: Chronic  . 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 Panic disorder  without agoraphobia 03/10/2012  . H/O disease 03/10/2012  . Dysthymia 03/10/2012  . Pelvic congestion syndrome 10/13/2011  . Coitalgia 10/13/2011  . Chronic migraine without aura 10/13/2011  . Unspecified dyspareunia 10/13/2011  . IBS (irritable bowel syndrome) 08/25/2011  . Diarrhea 05/27/2011  . OBESITY, UNSPECIFIED 09/17/2010  . Transaminitis 09/17/2010  . IDA (iron deficiency anemia) 07/23/2010    Past Surgical History:  Procedure Laterality Date  . BIOPSY  04/10/2015   Procedure: BIOPSY;  Surgeon: Daneil Dolin, MD;  Location: [facility];  Service: Endoscopy;;  . cath self every nite     for sodium bicarb injection (discontinued 2013)  . CHOLECYSTECTOMY  2005   biliary dyskinesia  . COLONOSCOPY  JUN 2012 ABD PN/DIARRHEA WITH PROPOFOL   NL COLON  . DILATION AND CURETTAGE OF UTERUS    . DILITATION & CURRETTAGE/HYSTROSCOPY WITH NOVASURE ABLATION N/A 03/24/2017   Procedure: DILATATION & CURETTAGE/HYSTEROSCOPY WITH NOVASURE ENDOMETRIAL ABLATION;  Surgeon: Jonnie Kind, MD;  Location: [facility];  Service: Gynecology;  Laterality: N/A;  . ESOPHAGEAL DILATION N/A 04/10/2015  Procedure: ESOPHAGEAL DILATION WITH 54FR MALONEY DILATOR;  Surgeon: Daneil Dolin, MD;  Location: [facility];  Service: Endoscopy;  Laterality: N/A;  . ESOPHAGOGASTRODUODENOSCOPY    . ESOPHAGOGASTRODUODENOSCOPY (EGD) WITH PROPOFOL N/A 04/10/2015   Procedure: ESOPHAGOGASTRODUODENOSCOPY (EGD) WITH PROPOFOL;  Surgeon: Daneil Dolin, MD;  Location: [facility];  Service: Endoscopy;  Laterality: N/A;  . ESOPHAGOGASTRODUODENOSCOPY (EGD) WITH PROPOFOL N/A 12/06/2016   Procedure: ESOPHAGOGASTRODUODENOSCOPY (EGD) WITH PROPOFOL;  Surgeon: Danie Binder, MD;  [facility];  Service: Endoscopy;  Laterality: N/A;  . ESOPHAGOGASTRODUODENOSCOPY (EGD) WITH PROPOFOL N/A 04/27/2019   Procedure: ESOPHAGOGASTRODUODENOSCOPY (EGD) WITH PROPOFOL;  Surgeon: Rogene Houston, MD;  [facility];  Service: Endoscopy;  Laterality: N/A;   730  . GAB  2007   in [place]-POUCH 5 CM  . GASTRIC BYPASS  06/2006  . HYSTEROSCOPY W/D&C N/A 09/12/2014   Procedure: DILATATION AND CURETTAGE /HYSTEROSCOPY;  Surgeon: Jonnie Kind, MD;  Location: [facility];  Service: Gynecology;  Laterality: N/A;  . KYPHOPLASTY N/A 08/02/2018   Procedure: KYPHOPLASTY T12;  Surgeon: Melina Schools, MD;  Location: [facility];  Service: Orthopedics;  Laterality: N/A;  60 mins  . LAPAROSCOPIC TUBAL LIGATION Bilateral 03/24/2017   Procedure: LAPAROSCOPIC TUBAL LIGATION ([place] Rings);  Surgeon: Jonnie Kind, MD;  Location: [facility];  Service: Gynecology;  Laterality: Bilateral;  . REPAIR VAGINAL CUFF N/A 07/30/2014   Procedure: REPAIR VAGINAL CUFF;  Surgeon: Mora Bellman, MD;  Location: [facility];  Service: Gynecology;  Laterality: N/A;  . SAVORY DILATION  06/20/2012   Dr. Barnie Alderman gastritis/Ulcer in the mid jejunum. Empiric dilation.   . SURAL NERVE BX Left 02/25/2016   Procedure: LEFT SURAL NERVE BIOPSY;  Surgeon: Jovita Gamma, MD;  Location: [facility];  Service: Neurosurgery;  Laterality: Left;  Left sural nerve biopsy  . TONSILLECTOMY    . TONSILLECTOMY AND ADENOIDECTOMY    . UPPER GASTROINTESTINAL ENDOSCOPY  JULY 2011 NAUSEA-D125,V6, PH 25   Bx; GASTRITIS, POUCH-5 CM LONG  . WISDOM TOOTH EXTRACTION      OB History    Gravida  3   Para  2   Term  1   Preterm  1   AB  1   Living  2     SAB  1   TAB      Ectopic      Multiple      Live Births  2            Home Medications    Prior to Admission medications   Medication Sig Start Date End Date Taking? Authorizing Provider  amphetamine-dextroamphetamine (ADDERALL XR) 30 MG 24 hr capsule TAKE (1) CAPSULE BY MOUTH EVERY DAY. 05/25/19   Cloria Spring, MD  amphetamine-dextroamphetamine (ADDERALL XR) 30 MG 24 hr capsule Take 1 capsule (30 mg total) by mouth daily. 05/25/19   Cloria Spring, MD  Capsaicin-Menthol-Methyl Sal (CAPSAICIN-METHYL SAL-MENTHOL) 0.025-1-12 % CREA Apply 1  application topically 4 (four) times daily as needed (as needed for muscle spasms and low back pain). 07/14/19   Noe Gens, PA-C  clonazePAM (KLONOPIN) 0.5 MG tablet Take 1 tablet (0.5 mg total) by mouth 3 (three) times daily as needed for anxiety. 05/21/19 05/20/20  Cloria Spring, MD  cyclobenzaprine (FLEXERIL) 5 MG tablet Take 1-2 tablets (5-10 mg total) by mouth 2 (two) times daily as needed for muscle spasms. 07/14/19   Noe Gens, PA-C  DULoxetine (CYMBALTA) 60 MG capsule Take 1 capsule (60  mg total) by mouth at bedtime. 05/25/19   Cloria Spring, MD  pantoprazole (PROTONIX) 40 MG tablet Take 1 tablet (40 mg total) by mouth 2 (two) times daily before a meal. 04/27/19   Rehman, Mechele Dawley, MD  PROAIR HFA 108 (90 Base) MCG/ACT inhaler INHALE 2 PUFFS INTO THE LUNGS EVERY 6 HOURS AS NEEDED. Patient taking differently: Inhale 2 puffs into the lungs every 6 (six) hours as needed for wheezing or shortness of breath.  06/14/17   Kathyrn Drown, MD  promethazine (PHENERGAN) 25 MG tablet TAKE ONE TABLET BY MOUTH EVERY 6 HOURS AS NEEDED. Patient taking differently: Take 25 mg by mouth every 6 (six) hours as needed for nausea or vomiting.  07/03/18   Estill Dooms, NP  topiramate (TOPAMAX) 50 MG tablet Take one in the am and 2 at night 03/27/19   Cloria Spring, MD  valACYclovir (VALTREX) 1000 MG tablet Take 1 bid for 10 days then Take  1 daily for suppression 05/03/19   Estill Dooms, NP  zolpidem (AMBIEN) 10 MG tablet Take 1 tablet (10 mg total) by mouth at bedtime for 30 days. 05/25/19 06/24/19  Cloria Spring, MD    Family History Family History  Problem Relation Age of Onset  . Hemochromatosis Maternal Grandmother   . Migraines Maternal Grandmother   . Cancer Maternal Grandmother   . Breast cancer Maternal Grandmother   . Hypertension Father   . Diabetes Father   . Coronary artery disease Father   . Migraines Paternal Grandmother   . Breast cancer Paternal Grandmother   . Cancer  Mother        breast  . Hemochromatosis Mother   . Breast cancer Mother   . Depression Mother   . Anxiety disorder Mother   . Coronary artery disease Paternal Grandfather   . Anxiety disorder Brother   . Bipolar disorder Brother   . Healthy Daughter   . Healthy Son     Social History Social History   Tobacco Use  . Smoking status: Current Every Day Smoker    Packs/day: 0.25    Years: 6.00    Pack years: 1.50    Types: E-cigarettes    Start date: 05/2018  . Smokeless tobacco: Never Used  Substance Use Topics  . Alcohol use: Yes    Alcohol/week: 0.0 standard drinks    Comment: 1-2 beers a couple times a month   . Drug use: Not Currently    Types: Marijuana    Comment: Previously opioid addiction went through rehab     Allergies   Gabapentin, Metoclopramide hcl, Tramadol, Ativan [lorazepam], Latex, Lyrica [pregabalin], Trazodone and nefazodone, Zofran [ondansetron], Sulfa antibiotics, Butrans [buprenorphine], Nucynta [tapentadol], Propofol, Tape, and Toradol [ketorolac tromethamine]   Review of Systems Review of Systems  Constitutional: Negative for chills and fever.  Gastrointestinal: Negative for abdominal pain, nausea and vomiting.  Genitourinary: Positive for flank pain (bilateral). Negative for dysuria, frequency, hematuria and urgency.  Musculoskeletal: Positive for back pain and myalgias. Negative for arthralgias and gait problem.  Skin: Negative for color change and rash.  Neurological: Negative for weakness and numbness.     Physical Exam Triage Vital Signs ED Triage Vitals  Enc Vitals Group     BP 07/14/19 1358 118/83     Pulse Rate 07/14/19 1358 (!) 103     Resp --      Temp -- 98*F     Temp src --      SpO2  07/14/19 1358 100 %     Weight 07/14/19 1359 188 lb (85.3 kg)     Height 07/14/19 1359 '5\' 6"'$  (1.676 m)     Head Circumference --      Peak Flow --      Pain Score 07/14/19 1359 8     Pain Loc --      Pain Edu? --      Excl. in [place]? --     No data found.  Updated Vital Signs BP 118/83 (BP Location: Right Arm)   Pulse (!) 103   Temp 98 F (36.7 C) (Oral)   Ht '5\' 6"'$  (1.676 m)   Wt 188 lb (85.3 kg)   SpO2 100%   BMI 30.34 kg/m   Visual Acuity Right Eye Distance:   Left Eye Distance:   Bilateral Distance:    Right Eye Near:   Left Eye Near:    Bilateral Near:     Physical Exam Vitals signs and nursing note reviewed.  Constitutional:      Appearance: Normal appearance. She is well-developed.  HENT:     Head: Normocephalic and atraumatic.  Neck:     Musculoskeletal: Normal range of motion.  Cardiovascular:     Rate and Rhythm: Normal rate.  Pulmonary:     Effort: Pulmonary effort is normal.  Abdominal:     General: There is no distension.     Palpations: Abdomen is soft.     Tenderness: There is no abdominal tenderness.  Musculoskeletal: Normal range of motion.        General: Tenderness present.     Comments: Diffuse tenderness from lower thoracic and bilateral lumbar muscles. Full ROM upper and lower extremities bilaterally. Normal gait.   Skin:    General: Skin is warm and dry.     Findings: No bruising, erythema or rash.  Neurological:     Mental Status: She is alert and oriented to person, place, and time.  Psychiatric:        Behavior: Behavior normal.      UC Treatments / Results  Labs (all labs ordered are listed, but only abnormal results are displayed) Labs Reviewed  POCT URINALYSIS DIP (MANUAL ENTRY)    EKG   Radiology No results found.  Procedures Procedures (including critical care time)  Medications Ordered in UC Medications  methylPREDNISolone sodium succinate (SOLU-MEDROL) 40 mg/mL injection 80 mg (80 mg Intramuscular Given 07/14/19 1432)    Initial Impression / Assessment and Plan / UC Course  I have reviewed the triage vital signs and the nursing notes.  Pertinent labs & imaging results that were available during my care of the patient were reviewed by me and  considered in my medical decision making (see chart for details).     UA: WNL No red flag symptoms Reviewed medical records, due to pt's hx of opioid addiction, will try steroids, flexeril, and muscle rub Encouraged f/u with Sports Medicine or PCP next week as needed.  Discussed symptoms that warrant emergent care in the ED.  Final Clinical Impressions(s) / UC Diagnoses   Final diagnoses:  Acute bilateral low back pain with bilateral sciatica     Discharge Instructions      Flexeril (cyclobenzaprine) is a muscle relaxer and may cause drowsiness. Do not drink alcohol, drive, or operate heavy machinery while taking.  Please follow up with Sports Medicine or your family doctor this week for further evaluation and treatment of low back pain.  Call 911 or go to  the hospital if symptoms worsening.     ED Prescriptions    Medication Sig Dispense Auth. Provider   cyclobenzaprine (FLEXERIL) 5 MG tablet Take 1-2 tablets (5-10 mg total) by mouth 2 (two) times daily as needed for muscle spasms. 30 tablet Gerarda Fraction, Chung Chagoya O, PA-C   Capsaicin-Menthol-Methyl Sal (CAPSAICIN-METHYL SAL-MENTHOL) 0.025-1-12 % CREA Apply 1 application topically 4 (four) times daily as needed (as needed for muscle spasms and low back pain). 56.6 g Noe Gens, PA-C     Controlled Substance Prescriptions [place] Controlled Substance Registry consulted? Not Applicable   Tyrell Antonio 07/15/19 1203    Leeroy Cha O, PA-C 07/15/19 1350

## 2019-07-14 NOTE — ED Triage Notes (Signed)
Patient c/o low back pain x 3 days, no injury, no urinary sxs, patient does have a history of back pain.

## 2019-07-17 ENCOUNTER — Other Ambulatory Visit (HOSPITAL_COMMUNITY): Payer: Self-pay | Admitting: Psychiatry

## 2019-07-25 ENCOUNTER — Ambulatory Visit (INDEPENDENT_AMBULATORY_CARE_PROVIDER_SITE_OTHER): Payer: Medicaid Other | Admitting: Psychiatry

## 2019-07-25 ENCOUNTER — Other Ambulatory Visit: Payer: Self-pay

## 2019-07-25 ENCOUNTER — Encounter (HOSPITAL_COMMUNITY): Payer: Self-pay | Admitting: Psychiatry

## 2019-07-25 DIAGNOSIS — M549 Dorsalgia, unspecified: Secondary | ICD-10-CM

## 2019-07-25 DIAGNOSIS — F332 Major depressive disorder, recurrent severe without psychotic features: Secondary | ICD-10-CM

## 2019-07-25 DIAGNOSIS — F411 Generalized anxiety disorder: Secondary | ICD-10-CM

## 2019-07-25 DIAGNOSIS — F900 Attention-deficit hyperactivity disorder, predominantly inattentive type: Secondary | ICD-10-CM | POA: Diagnosis not present

## 2019-07-25 MED ORDER — CARBAMAZEPINE 200 MG PO TABS: 400.0000 mg | ORAL_TABLET | Freq: Every day | ORAL | 2 refills | Status: DC

## 2019-07-25 MED ORDER — DULOXETINE HCL 60 MG PO CPEP: 60.0000 mg | ORAL_CAPSULE | Freq: Two times a day (BID) | ORAL | 2 refills | Status: DC

## 2019-07-25 MED ORDER — ZOLPIDEM TARTRATE 10 MG PO TABS: 10.0000 mg | ORAL_TABLET | Freq: Every day | ORAL | 2 refills | Status: DC

## 2019-07-25 MED ORDER — CLONAZEPAM 0.5 MG PO TABS: 0.5000 mg | ORAL_TABLET | Freq: Three times a day (TID) | ORAL | 0 refills | Status: DC

## 2019-07-25 MED ORDER — AMPHETAMINE-DEXTROAMPHET ER 30 MG PO CP24: 30.0000 mg | ORAL_CAPSULE | Freq: Every day | ORAL | 0 refills | Status: DC

## 2019-07-25 MED ORDER — AMPHETAMINE-DEXTROAMPHET ER 30 MG PO CP24: ORAL_CAPSULE | ORAL | 0 refills | Status: DC

## 2019-07-25 NOTE — Progress Notes (Signed)
Virtual Visit via Video Note  I connected with Penny [name] on 07/25/19 at  2:40 PM EDT by a video enabled telemedicine application and verified that I am speaking with the correct person using two identifiers.   I discussed the limitations of evaluation and management by telemedicine and the availability of in person appointments. The patient expressed understanding and agreed to proceed.    I discussed the assessment and treatment plan with the patient. The patient was provided an opportunity to ask questions and all were answered. The patient agreed with the plan and demonstrated an understanding of the instructions.   The patient was advised to call back or seek an in-person evaluation if the symptoms worsen or if the condition fails to improve as anticipated.  I provided 15 minutes of non-face-to-face time during this encounter.   [name], MD  [facility] MD/PA/NP OP Progress Note  07/25/2019 2:59 PM Penny [name]  MRN:  [id number]  Chief Complaint:  Chief Complaint    Depression; Anxiety; Follow-up     HPI: This patient is a 33 year old female who is currently living with her boyfriend 12-year-old daughter her son and her boyfriend's 2 daughters in [place].  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  The patient returns after 2 months regarding mood swings anxiety ADHD and depression.  She states that she feels overwhelmed because of severe back pain.  She is going to see an orthopedic about it tomorrow and she may need surgery.  Injections have not really helped that much.  She is also going to a pain medicine psychologist and this seems to be helping with her anxiety.  She states that she is still not sleeping that well with Ambien 10 mg but we have gone through every sleeping medicine available and I have explained to her that this cannot override the severe pain.  She denies any thoughts of self-harm.  She states  the clonazepam continues to help her anxiety and Adderall is helping her focus.  She does get good support from her family and her fianc.  I suggested that we go up on her Cymbalta to 60 mg twice daily and she agrees Visit Diagnosis:    ICD-10-CM   1. Major depressive disorder, recurrent episode, 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[place])  F33.2   2. GAD (generalized anxiety disorder)  F41.1   3. Attention deficit hyperactivity disorder (ADHD), predominantly inattentive type  F90.0     Past Psychiatric History: Psychiatric hospitalization last year  Past Medical History:  Past Medical History:  Diagnosis Date  . ADD (attention deficit disorder)   . Anemia 2011   2o to GASTRIC BYPASS  . Anginal pain ([place])   . Anxiety   . Blood transfusion without reported diagnosis   . Chronic daily headache   . Depression   . Depression   . Dysmenorrhea 09/18/2013  . Dysrhythmia   . Elevated liver enzymes JUL 2011ALK PHOS 111-127 AST  143-267 ALT  213-321T BILI 0.6  ALB  3.7-4.06 Jun 2011 ALK PHOS 118 AST 24 ALT 42 T BILI 0.4 ALB 3.9  . Encounter for drug rehabilitation    at behavioral health for opioid addiction about 4 years ago  . Family history of adverse reaction to anesthesia    'dad had to be kept on pump for breathing for morphine'  . Fatty liver   . Fibroid 01/18/2017  . Fibromyalgia   .  Gastric bypass status for obesity   . Gastritis JULY 2011  . Heart murmur   . 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 History of Holter monitoring   . Hx of opioid abuse ([place])    for about 4 years, about 4 years ago  . Interstitial cystitis   . Iron deficiency anemia 07/23/2010  . Irritable bowel syndrome 2012 DIARRHEA   JUN 2012 TTG IgA 14.9  . IUD FEB 2010  . Lupus ([place])   . Menorrhagia 07/18/2013  . Migraines   . Obesity (BMI 30-39.9) 2011 228 LBS BMI 36.8  . Ovarian cyst   . Patient desires pregnancy 09/18/2013  . Polyneuropathy   . PONV (postoperative nausea and  vomiting) seizure post-operatively   seizure following ankle surgery  . Potassium (K) deficiency   . Pregnant 12/25/2013  . Psychiatric pseudoseizure   . RLQ abdominal pain 07/18/2013  . Sciatica of left side 11/14/2014  . 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 Stress 09/18/2013    Past Surgical History:  Procedure Laterality Date  . BIOPSY  04/10/2015   Procedure: BIOPSY;  Surgeon: Daneil Dolin, MD;  Location: [facility];  Service: Endoscopy;;  . cath self every nite     for sodium bicarb injection (discontinued 2013)  . CHOLECYSTECTOMY  2005   biliary dyskinesia  . COLONOSCOPY  JUN 2012 ABD PN/DIARRHEA WITH PROPOFOL   NL COLON  . DILATION AND CURETTAGE OF UTERUS    . DILITATION & CURRETTAGE/HYSTROSCOPY WITH NOVASURE ABLATION N/A 03/24/2017   Procedure: DILATATION & CURETTAGE/HYSTEROSCOPY WITH NOVASURE ENDOMETRIAL ABLATION;  Surgeon: Jonnie Kind, MD;  Location: [facility];  Service: Gynecology;  Laterality: N/A;  . ESOPHAGEAL DILATION N/A 04/10/2015   Procedure: ESOPHAGEAL DILATION WITH 54FR MALONEY DILATOR;  Surgeon: Daneil Dolin, MD;  Location: [facility];  Service: Endoscopy;  Laterality: N/A;  . ESOPHAGOGASTRODUODENOSCOPY    . ESOPHAGOGASTRODUODENOSCOPY (EGD) WITH PROPOFOL N/A 04/10/2015   Procedure: ESOPHAGOGASTRODUODENOSCOPY (EGD) WITH PROPOFOL;  Surgeon: Daneil Dolin, MD;  Location: [facility];  Service: Endoscopy;  Laterality: N/A;  . ESOPHAGOGASTRODUODENOSCOPY (EGD) WITH PROPOFOL N/A 12/06/2016   Procedure: ESOPHAGOGASTRODUODENOSCOPY (EGD) WITH PROPOFOL;  Surgeon: Danie Binder, MD;  [facility];  Service: Endoscopy;  Laterality: N/A;  . ESOPHAGOGASTRODUODENOSCOPY (EGD) WITH PROPOFOL N/A 04/27/2019   Procedure: ESOPHAGOGASTRODUODENOSCOPY (EGD) WITH PROPOFOL;  Surgeon: Rogene Houston, MD;  [facility];  Service: Endoscopy;  Laterality: N/A;  730  . GAB  2007   in [place]-POUCH 5 CM  . GASTRIC BYPASS  06/2006  . HYSTEROSCOPY W/D&C N/A 09/12/2014    Procedure: DILATATION AND CURETTAGE /HYSTEROSCOPY;  Surgeon: Jonnie Kind, MD;  Location: [facility];  Service: Gynecology;  Laterality: N/A;  . KYPHOPLASTY N/A 08/02/2018   Procedure: KYPHOPLASTY T12;  Surgeon: Melina Schools, MD;  Location: [place];  Service: Orthopedics;  Laterality: N/A;  60 mins  . LAPAROSCOPIC TUBAL LIGATION Bilateral 03/24/2017   Procedure: LAPAROSCOPIC TUBAL LIGATION ([place] Rings);  Surgeon: Jonnie Kind, MD;  Location: [facility];  Service: Gynecology;  Laterality: Bilateral;  . REPAIR VAGINAL CUFF N/A 07/30/2014   Procedure: REPAIR VAGINAL CUFF;  Surgeon: Mora Bellman, MD;  Location: [facility];  Service: Gynecology;  Laterality: N/A;  . SAVORY DILATION  06/20/2012   Dr. Barnie Alderman gastritis/Ulcer in the mid jejunum. Empiric dilation.   . SURAL NERVE BX Left 02/25/2016   Procedure: LEFT SURAL NERVE BIOPSY;  Surgeon: Jovita Gamma, MD;  Location: [facility];  Service: Neurosurgery;  Laterality: Left;  Left sural nerve biopsy  . TONSILLECTOMY    . TONSILLECTOMY AND ADENOIDECTOMY    . UPPER GASTROINTESTINAL ENDOSCOPY  JULY 2011 NAUSEA-D125,V6, PH 25   Bx; GASTRITIS, POUCH-5 CM LONG  . WISDOM TOOTH EXTRACTION      Family Psychiatric History: see below  Family History:  Family History  Problem Relation Age of Onset  . Hemochromatosis Maternal Grandmother   . Migraines Maternal Grandmother   . Cancer Maternal Grandmother   . Breast cancer Maternal Grandmother   . Hypertension Father   . Diabetes Father   . Coronary artery disease Father   . Migraines Paternal Grandmother   . Breast cancer Paternal Grandmother   . Cancer Mother        breast  . Hemochromatosis Mother   . Breast cancer Mother   . Depression Mother   . Anxiety disorder Mother   . Coronary artery disease Paternal Grandfather   . Anxiety disorder Brother   . Bipolar disorder Brother   . Healthy Daughter   . Healthy Son     Social History:  Social History   Socioeconomic History  . Marital  status: Divorced    Spouse name: Not on file  . Number of children: 2  . Years of education: some colle  . Highest education level: Not on file  Occupational History    Employer: NOT EMPLOYED  Social Needs  . Financial resource strain: Very hard  . Food insecurity    Worry: Never true    Inability: Never true  . Transportation needs    Medical: No    Non-medical: No  Tobacco Use  . Smoking status: Current Every Day Smoker    Packs/day: 0.25    Years: 6.00    Pack years: 1.50    Types: E-cigarettes    Start date: 05/2018  . Smokeless tobacco: Never Used  Substance and Sexual Activity  . Alcohol use: Yes    Alcohol/week: 0.0 standard drinks    Comment: 1-2 beers a couple times a month   . Drug use: Not Currently    Types: Marijuana    Comment: Previously opioid addiction went through rehab  . Sexual activity: Yes    Partners: Male    Birth control/protection: Surgical, Condom    Comment: tubal and ablation  Lifestyle  . Physical activity    Days per week: 0 days    Minutes per session: 0 min  . Stress: Very much  Relationships  . Social connections    Talks on phone: More than three times a week    Gets together: More than three times a week    Attends religious service: More than 4 times per year    Active member of club or organization: No    Attends meetings of clubs or organizations: Never    Relationship status: Divorced  Other Topics Concern  . Not on file  Social History Narrative   Patient is right handed.   Patient drinks one cup of coffee daily.   She lives in a one-story home with her two children (80 year old son, 23 month old daughter).   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 Gabapentin Other (See Comments)    Pt states that she was unresponsive after taking this medication, but her vitals remained stable.    . Metoclopramide  Hcl Anxiety and Other  (See Comments)    Pt states that she felt like she was trapped in a box, and could not get out.  Pt also states that she had temporary loss of movement, weakness, and tingling.    . Tramadol Other (See Comments)    Seizures  . Ativan [Lorazepam] Other (See Comments)    combative  . Latex Itching, Rash and Other (See Comments)    [place]  . Lyrica [Pregabalin] Other (See Comments)    suicidal  . Trazodone And Nefazodone Other (See Comments)    Makes pt "like a zombie"  . Zofran [Ondansetron] Other (See Comments)    Migraines in high doses    . Sulfa Antibiotics Hives  . Butrans [Buprenorphine] Rash    Patch caused rash, didn't help with pain  . Nucynta [Tapentadol] Nausea And Vomiting  . Propofol Nausea And Vomiting  . Tape Itching, Rash and Other (See Comments)    Please use paper tape  . Toradol [Ketorolac Tromethamine] Anxiety    Metabolic Disorder Labs: Lab Results  Component Value Date   HGBA1C 5.7 (H) 01/15/2015   Lab Results  Component Value Date   PROLACTIN 80.0 (H) 05/09/2015   Lab Results  Component Value Date   CHOL 95 03/10/2012   TRIG 50 03/10/2012   HDL 38 (L) 03/10/2012   CHOLHDL 2.5 03/10/2012   VLDL 10 03/10/2012   LDLCALC 47 03/10/2012   Lab Results  Component Value Date   TSH 1.592 05/25/2019   TSH 1.780 12/14/2018    Therapeutic Level Labs: No results found for: LITHIUM No results found for: VALPROATE No components found for:  CBMZ  Current Medications: Current Outpatient Medications  Medication Sig Dispense Refill  . amphetamine-dextroamphetamine (ADDERALL XR) 30 MG 24 hr capsule TAKE (1) CAPSULE BY MOUTH EVERY DAY. 30 capsule 0  . amphetamine-dextroamphetamine (ADDERALL XR) 30 MG 24 hr capsule Take 1 capsule (30 mg total) by mouth daily. 30 capsule 0  . Capsaicin-Menthol-Methyl Sal (CAPSAICIN-METHYL SAL-MENTHOL) 0.025-1-12 % CREA Apply 1 application topically 4 (four) times daily as needed (as needed for muscle spasms and low back pain).  56.6 g 0  . clonazePAM (KLONOPIN) 0.5 MG tablet Take 1 tablet (0.5 mg total) by mouth 3 (three) times daily. 90 tablet 0  . cyclobenzaprine (FLEXERIL) 5 MG tablet Take 1-2 tablets (5-10 mg total) by mouth 2 (two) times daily as needed for muscle spasms. 30 tablet 0  . DULoxetine (CYMBALTA) 60 MG capsule Take 1 capsule (60 mg total) by mouth 2 (two) times daily. 180 capsule 2  . pantoprazole (PROTONIX) 40 MG tablet Take 1 tablet (40 mg total) by mouth 2 (two) times daily before a meal. 60 tablet 3  . PROAIR HFA 108 (90 Base) MCG/ACT inhaler INHALE 2 PUFFS INTO THE LUNGS EVERY 6 HOURS AS NEEDED. (Patient taking differently: Inhale 2 puffs into the lungs every 6 (six) hours as needed for wheezing or shortness of breath. ) 8.5 g 0  . promethazine (PHENERGAN) 25 MG tablet TAKE ONE TABLET BY MOUTH EVERY 6 HOURS AS NEEDED. (Patient taking differently: Take 25 mg by mouth every 6 (six) hours as needed for nausea or vomiting. ) 30 tablet 0  . topiramate (TOPAMAX) 50 MG tablet Take one in the am and 2 at night 90 tablet 2  . valACYclovir (VALTREX) 1000 MG tablet Take 1 bid for 10 days then Take  1 daily for suppression 90 tablet prn  . zolpidem (AMBIEN) 10 MG tablet  Take 1 tablet (10 mg total) by mouth at bedtime. 30 tablet 2   No current facility-administered medications for this visit.      Musculoskeletal: Strength & Muscle Tone: within normal limits Gait & Station: normal Patient leans: N/A  Psychiatric Specialty Exam: Review of Systems  Musculoskeletal: Positive for back pain.  Psychiatric/Behavioral: The patient is nervous/anxious.   All other systems reviewed and are negative.   There were no vitals taken for this visit.There is no height or weight on file to calculate BMI.  General Appearance: Casual and Fairly Groomed  Eye Contact:  Good  Speech:  Clear and Coherent  Volume:  Normal  Mood:  Anxious  Affect:  Depressed  Thought Process:  Goal Directed  Orientation:  Full (Time, Place,  and Person)  Thought Content: Rumination   Suicidal Thoughts:  No  Homicidal Thoughts:  No  Memory:  Immediate;   Good Recent;   Good Remote;   Fair  Judgement:  Fair  Insight:  Fair  Psychomotor Activity:  Decreased  Concentration:  Concentration: Good and Attention Span: Good  Recall:  Good  Fund of Knowledge: Good  Language: Good  Akathisia:  No  Handed:  Right  AIMS (if indicated): not done  Assets:  Communication Skills Desire for Improvement Resilience Social Support Talents/Skills  ADL's:  Intact  Cognition: WNL  Sleep:  Fair   Screenings: AUDIT     ED to Hosp-Admission (Discharged) from 05/09/2015 in [facility]  Alcohol Use Disorder Identification Test Final Score (AUDIT)  0    GAD-7     Counselor from 12/11/2018 in [facility] Counselor from 02/07/2017 in [place] [facility]  Total GAD-7 Score  19  17    PHQ2-9     Counselor from 12/11/2018 in [place] Counselor from 07/22/2017 in [facility] Counselor from 02/07/2017 in [facility] [place] Office Visit from 01/10/2017 in [facility] Counselor from 10/06/2016 in [place] [facility]  PHQ-2 Total Score  '6  3  2  3  4  '$ PHQ-9 Total Score  '22  15  14  10  18       '$ Assessment and Plan: This patient is a 33 year old female with a history depression anxiety and ADHD.  She does have elements of borderline personality as well.  Right now she is very focused on her pain.  She will continue Tegretol 400 mg at bedtime for mood stabilization, Cymbalta 60 mg will be increased to twice daily for depression and pain, Ambien 10 mg at bedtime for sleep, clonazepam 0.5 mg 3 times daily for anxiety and Adderall XR 30 mg daily for focus.  She will return to see me in 2  months   [name], MD 07/25/2019, 2:59 PM

## 2019-08-05 ENCOUNTER — Other Ambulatory Visit (HOSPITAL_COMMUNITY): Payer: Self-pay | Admitting: Psychiatry

## 2019-08-06 NOTE — Telephone Encounter (Signed)
SPOKE WITH PATIENT  & SHE IS STILL TAKING TOPAMAX

## 2019-08-06 NOTE — Telephone Encounter (Signed)
Please ask pt if she is still taking this

## 2019-08-14 ENCOUNTER — Other Ambulatory Visit (HOSPITAL_COMMUNITY): Payer: Self-pay | Admitting: Psychiatry

## 2019-08-16 ENCOUNTER — Emergency Department (HOSPITAL_COMMUNITY): Payer: Medicaid Other

## 2019-08-16 ENCOUNTER — Emergency Department (HOSPITAL_COMMUNITY)
Admission: EM | Admit: 2019-08-16 | Discharge: 2019-08-17 | Disposition: A | Discharge status: Home / Self Care | Payer: Medicaid Other | Attending: Emergency Medicine | Admitting: Emergency Medicine

## 2019-08-16 ENCOUNTER — Other Ambulatory Visit: Payer: Self-pay

## 2019-08-16 ENCOUNTER — Encounter (HOSPITAL_COMMUNITY): Payer: Self-pay | Admitting: *Deleted

## 2019-08-16 DIAGNOSIS — F17290 Nicotine dependence, other tobacco product, uncomplicated: Secondary | ICD-10-CM | POA: Insufficient documentation

## 2019-08-16 DIAGNOSIS — Z91040 Latex allergy status: Secondary | ICD-10-CM | POA: Insufficient documentation

## 2019-08-16 DIAGNOSIS — R0602 Shortness of breath: Secondary | ICD-10-CM | POA: Insufficient documentation

## 2019-08-16 DIAGNOSIS — R0600 Dyspnea, unspecified: Secondary | ICD-10-CM

## 2019-08-16 DIAGNOSIS — R079 Chest pain, unspecified: Secondary | ICD-10-CM | POA: Diagnosis not present

## 2019-08-16 DIAGNOSIS — Z79899 Other long term (current) drug therapy: Secondary | ICD-10-CM | POA: Diagnosis not present

## 2019-08-16 MED ORDER — ALBUTEROL SULFATE HFA 108 (90 BASE) MCG/ACT IN AERS
4.0000 | INHALATION_SPRAY | Freq: Once | RESPIRATORY_TRACT | Status: AC
  Administered 2019-08-16: 4 via RESPIRATORY_TRACT
  Filled 2019-08-16: qty 6.7

## 2019-08-16 NOTE — ED Provider Notes (Signed)
Emergency Department Provider Note   I have reviewed the triage vital signs and the nursing notes.   HISTORY  Chief Complaint Shortness of Breath and Chest Pain   HPI Penny [name] is a 33 y.o. female who presents to the ED for a weeks worth of SOB. Seen by PCP apparently sent here for further eval. also states she has a history of panic attacks which can be similar. She has no productive cough, fever or sick contacts. No new rashes or LE eema. No recent surgeries or long car rides.   No other associated or modifying symptoms.    Past Medical History:  Diagnosis Date   ADD (attention deficit disorder)    Anemia 2011   2o to GASTRIC BYPASS   Anginal pain ([place])    Anxiety    Blood transfusion without reported diagnosis    Chronic daily headache    Depression    Depression    Dysmenorrhea 09/18/2013   Dysrhythmia    Elevated liver enzymes JUL 2011ALK PHOS 111-127 AST  143-267 ALT  213-321T BILI 0.6  ALB  3.7-4.06 Jun 2011 ALK PHOS 118 AST 24 ALT 42 T BILI 0.4 ALB 3.9   Encounter for drug rehabilitation    at behavioral health for opioid addiction about 4 years ago   Family history of adverse reaction to anesthesia    'dad had to be kept on pump for breathing for morphine'   Fatty liver    Fibroid 01/18/2017   Fibromyalgia    Gastric bypass status for obesity    Gastritis JULY 2011   Heart murmur    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   History of Holter monitoring    Hx of opioid abuse ([place])    for about 4 years, about 4 years ago   Interstitial cystitis    Iron deficiency anemia 07/23/2010   Irritable bowel syndrome 2012 DIARRHEA   JUN 2012 TTG IgA 14.9   IUD FEB 2010   Lupus ([place])    Menorrhagia 07/18/2013   Migraines    Obesity (BMI 30-39.9) 2011 228 LBS BMI 36.8   Ovarian cyst    Patient desires pregnancy 09/18/2013   Polyneuropathy    PONV (postoperative nausea and vomiting) seizure  post-operatively   seizure following ankle surgery   Potassium (K) deficiency    Pregnant 12/25/2013   Psychiatric pseudoseizure    RLQ abdominal pain 07/18/2013   Sciatica of left side 11/14/2014   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   Stress 09/18/2013    Patient Active Problem List   Diagnosis Date Noted   Hematemesis with nausea 04/23/2019   Absolute anemia 04/23/2019   Urge incontinence 10/26/2018   S/P kyphoplasty 08/02/2018   Herpes genitalis in women 12/19/2017   Abnormal urine odor 03/30/2017   Urinary tract infection with hematuria 03/30/2017   Screening for genitourinary condition 03/30/2017   Pain 03/30/2017   Burning with urination 03/30/2017   Family history of breast cancer in mother,uncertain BR CA status 02/28/2017   Fibroid 01/18/2017   Chronic pain syndrome 12/05/2016   Folate deficiency 12/04/2016   Nausea vomiting and diarrhea 12/04/2016   Severe anemia 12/04/2016   Symptomatic anemia 12/03/2016   Complaints of leg weakness    Paresthesia    Left-sided weakness    Uncontrolled pain 01/24/2016   Malnutrition of moderate degree 01/24/2016   Neuropathy 01/23/2016   Inability to walk 01/23/2016   Paresthesias    Bilateral  leg numbness 11/26/2015   Major depressive disorder, recurrent episode, 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[place]) 05/10/2015   Post partum depression 05/09/2015   Anastomotic ulcer    Dysphagia    Hematemesis 04/07/2015   Intractable vomiting with nausea 04/07/2015   Elevated liver enzymes    Epigastric pain    Pseudoseizure ([place]) 02/22/2015   Seizure-like activity ([place]) 02/22/2015   Fibromyalgia 02/18/2015   Vulvar fissure 02/18/2015   Clinical depression 02/01/2015   Current smoker 01/29/2015   Current tobacco use 01/29/2015   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   Leg weakness, bilateral 01/02/2015   Gait disorder 01/02/2015   Other symptoms and signs  involving the musculoskeletal system 01/02/2015   Abnormal gait 01/02/2015   Cervicalgia 12/18/2014   Sciatica of left side 11/14/2014   Neuralgia neuritis, sciatic nerve 11/14/2014   Pseudoseizures 09/12/2014   Encounter for sterilization 09/09/2014   Abdominal pain, acute, right lower quadrant 08/22/2014   Headache, migraine 08/19/2014   Seizure ([place]) 08/18/2014   Encephalopathy 08/13/2014   Headache 08/13/2014   Altered mental status 08/13/2014   Right leg numbness 07/31/2014   Disturbance of skin sensation 07/31/2014   Hypertension in pregnancy, transient 07/08/2014   Previous gastric bypass affecting pregnancy, antepartum 05/22/2014   Patent foramen ovale with right to left shunt 05/17/2014   ASD (atrial septal defect), ostium secundum 05/17/2014   Depression complicating pregnancy in second trimester, antepartum 05/09/2014   Antepartum mental disorder in pregnancy 05/09/2014   Rapid palpitations 02/18/2014   H/O maternal third degree perineal laceration, currently pregnant 02/18/2014   High-risk pregnancy 02/18/2014   Supervision of pregnancy with other poor reproductive or obstetric history, unspecified trimester 02/18/2014   Restless legs 01/16/2014   Restless leg 01/16/2014   Chronic interstitial cystitis 10/23/2013   Menorrhagia 07/18/2013   Excessive and frequent menstruation 07/18/2013   h/o Opiate addiction 03/11/2012    Class: Acute   History of migraine headaches 03/10/2012    Class: Acute   Depression with anxiety 03/10/2012    Class: Chronic   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 03/10/2012    Class: Chronic   ADD (attention deficit disorder) without hyperactivity 03/10/2012    Class: Chronic   Panic disorder without agoraphobia 03/10/2012   H/O disease 03/10/2012   Dysthymia 03/10/2012   Pelvic congestion syndrome 10/13/2011   Coitalgia 10/13/2011   Chronic migraine without aura 10/13/2011    Unspecified dyspareunia 10/13/2011   IBS (irritable bowel syndrome) 08/25/2011   Diarrhea 05/27/2011   OBESITY, UNSPECIFIED 09/17/2010   Transaminitis 09/17/2010   IDA (iron deficiency anemia) 07/23/2010    Past Surgical History:  Procedure Laterality Date   BIOPSY  04/10/2015   Procedure: BIOPSY;  Surgeon: Daneil Dolin, MD;  Location: [facility];  Service: Endoscopy;;   cath self every nite     for sodium bicarb injection (discontinued 2013)   CHOLECYSTECTOMY  2005   biliary dyskinesia   COLONOSCOPY  JUN 2012 ABD PN/DIARRHEA WITH PROPOFOL   NL COLON   DILATION AND CURETTAGE OF UTERUS     DILITATION & CURRETTAGE/HYSTROSCOPY WITH NOVASURE ABLATION N/A 03/24/2017   Procedure: DILATATION & CURETTAGE/HYSTEROSCOPY WITH NOVASURE ENDOMETRIAL ABLATION;  Surgeon: Jonnie Kind, MD;  Location: [facility];  Service: Gynecology;  Laterality: N/A;   ESOPHAGEAL DILATION N/A 04/10/2015   Procedure: ESOPHAGEAL DILATION WITH 54FR MALONEY DILATOR;  Surgeon: Daneil Dolin, MD;  Location: [facility];  Service: Endoscopy;  Laterality: N/A;   ESOPHAGOGASTRODUODENOSCOPY  ESOPHAGOGASTRODUODENOSCOPY (EGD) WITH PROPOFOL N/A 04/10/2015   Procedure: ESOPHAGOGASTRODUODENOSCOPY (EGD) WITH PROPOFOL;  Surgeon: Daneil Dolin, MD;  Location: [facility];  Service: Endoscopy;  Laterality: N/A;   ESOPHAGOGASTRODUODENOSCOPY (EGD) WITH PROPOFOL N/A 12/06/2016   Procedure: ESOPHAGOGASTRODUODENOSCOPY (EGD) WITH PROPOFOL;  Surgeon: Danie Binder, MD;  [facility];  Service: Endoscopy;  Laterality: N/A;   ESOPHAGOGASTRODUODENOSCOPY (EGD) WITH PROPOFOL N/A 04/27/2019   Procedure: ESOPHAGOGASTRODUODENOSCOPY (EGD) WITH PROPOFOL;  Surgeon: Rogene Houston, MD;  [facility];  Service: Endoscopy;  Laterality: N/A;  730   GAB  2007   in [place] 5 CM   GASTRIC BYPASS  06/2006   HYSTEROSCOPY W/D&C N/A 09/12/2014   Procedure: DILATATION AND CURETTAGE /HYSTEROSCOPY;  Surgeon: Jonnie Kind, MD;   Location: [facility];  Service: Gynecology;  Laterality: N/A;   KYPHOPLASTY N/A 08/02/2018   Procedure: KYPHOPLASTY T12;  Surgeon: Melina Schools, MD;  Location: [facility];  Service: Orthopedics;  Laterality: N/A;  60 mins   LAPAROSCOPIC TUBAL LIGATION Bilateral 03/24/2017   Procedure: LAPAROSCOPIC TUBAL LIGATION ([place] Rings);  Surgeon: Jonnie Kind, MD;  Location: [facility];  Service: Gynecology;  Laterality: Bilateral;   REPAIR VAGINAL CUFF N/A 07/30/2014   Procedure: REPAIR VAGINAL CUFF;  Surgeon: Mora Bellman, MD;  Location: [facility];  Service: Gynecology;  Laterality: N/A;   SAVORY DILATION  06/20/2012   Dr. Barnie Alderman gastritis/Ulcer in the mid jejunum. Empiric dilation.    SURAL NERVE BX Left 02/25/2016   Procedure: LEFT SURAL NERVE BIOPSY;  Surgeon: Jovita Gamma, MD;  Location: [facility];  Service: Neurosurgery;  Laterality: Left;  Left sural nerve biopsy   TONSILLECTOMY     TONSILLECTOMY AND ADENOIDECTOMY     UPPER GASTROINTESTINAL ENDOSCOPY  JULY 2011 NAUSEA-D125,V6, PH 25   Bx; GASTRITIS, POUCH-5 CM LONG   WISDOM TOOTH EXTRACTION      Current Outpatient Rx   Order #: [id number] Class: Normal   Order #: [id number] Class: Normal   Order #: [id number] Class: Normal   Order #: [id number] Class: Normal   Order #: [id number] Class: Normal   Order #: [id number] Class: Normal   Order #: [id number] Class: Normal   Order #: [id number] Class: Normal   Order #: [id number] Class: Normal   Order #: [id number] Class: Normal   Order #: [id number] Class: Normal   Order #: [id number] Class: Normal   Order #: [id number] Class: Normal    Allergies Gabapentin, Metoclopramide hcl, Tramadol, Ativan [lorazepam], Latex, Lyrica [pregabalin], Trazodone and nefazodone, Zofran [ondansetron], Sulfa antibiotics, Butrans [buprenorphine], Nucynta [tapentadol], Propofol, Tape, and Toradol [ketorolac tromethamine]  Family History  Problem Relation Age of Onset   Hemochromatosis Maternal Grandmother     Migraines Maternal Grandmother    Cancer Maternal Grandmother    Breast cancer Maternal Grandmother    Hypertension Father    Diabetes Father    Coronary artery disease Father    Migraines Paternal Grandmother    Breast cancer Paternal Grandmother    Cancer Mother        breast   Hemochromatosis Mother    Breast cancer Mother    Depression Mother    Anxiety disorder Mother    Coronary artery disease Paternal Grandfather    Anxiety disorder Brother    Bipolar disorder Brother    Healthy Daughter    Healthy Son     Social History Social History   Tobacco Use   Smoking status: Current Every Day Smoker    Packs/day: 0.25    Years: 6.00    Pack years: 1.50  Types: E-cigarettes    Start date: 05/2018   Smokeless tobacco: Never Used  Substance Use Topics   Alcohol use: Yes    Alcohol/week: 0.0 standard drinks    Comment: 1-2 beers a couple times a month    Drug use: Not Currently    Types: Marijuana    Comment: Previously opioid addiction went through rehab    Review of Systems  All other systems negative except as documented in the HPI. All pertinent positives and negatives as reviewed in the HPI. ____________________________________________   PHYSICAL EXAM:  VITAL SIGNS: ED Triage Vitals  Enc Vitals Group     BP 08/16/19 2031 104/67     Pulse Rate 08/16/19 2031 (!) 104     Resp 08/16/19 2031 (!) 22     Temp 08/16/19 2031 97.6 F (36.4 C)     Temp Source 08/16/19 2031 Oral     SpO2 08/16/19 2031 99 %     Weight 08/16/19 2029 185 lb (83.9 kg)     Height 08/16/19 2029 5' 6" (1.676 m)    Constitutional: Alert and oriented. Well appearing and in no acute distress. Eyes: Conjunctivae are pale. PERRL. EOMI. Head: Atraumatic. Nose: No congestion/rhinnorhea. Mouth/Throat: Mucous membranes are moist.  Oropharynx non-erythematous. Neck: No stridor.  No meningeal signs.   Cardiovascular: Normal rate, regular rhythm. Good peripheral  circulation. Grossly normal heart sounds.   Respiratory: tachypneic respiratory effort.  No retractions. Lungs CTAB. Gastrointestinal: Soft and nontender. No distention.  Musculoskeletal: No lower extremity tenderness nor edema. No gross deformities of extremities. Neurologic:  Normal speech and language. No gross focal neurologic deficits are appreciated.  Skin:  Skin is warm, dry and intact. No rash noted.   ____________________________________________   LABS (all labs ordered are listed, but only abnormal results are displayed)  Labs Reviewed  CBC WITH DIFFERENTIAL/PLATELET - Abnormal; Notable for the following components:      Result Value   Eosinophils Absolute 1.0 (*)    All other components within normal limits  COMPREHENSIVE METABOLIC PANEL - Abnormal; Notable for the following components:   CO2 21 (*)    All other components within normal limits  BRAIN NATRIURETIC PEPTIDE  D-DIMER, QUANTITATIVE (NOT AT [facility])  TROPONIN I (HIGH SENSITIVITY)   ____________________________________________  EKG   EKG Interpretation  Date/Time:  Thursday August 16 2019 20:48:32 EDT Ventricular Rate:  93 PR Interval:    QRS Duration: 80 QT Interval:  354 QTC Calculation: 441 R Axis:   70 Text Interpretation:  Sinus rhythm No significant change since last tracing Confirmed by Merrily Pew [phone]) on 08/16/2019 11:10:31 PM       ____________________________________________  RADIOLOGY  Dg Chest Portable 1 View  Result Date: 08/16/2019 CLINICAL DATA:  Shortness of breath, chest pain EXAM: PORTABLE CHEST 1 VIEW COMPARISON:  04/21/2019 FINDINGS: The heart size and mediastinal contours are within normal limits. Both lungs are clear. The visualized skeletal structures are unremarkable. IMPRESSION: No acute abnormality of the lungs in [facility] portable projection. Electronically Signed   By: Eddie Candle M.D.   On: 08/16/2019 21:17     ____________________________________________   PROCEDURES  Procedure(s) performed:   Procedures   ____________________________________________   INITIAL IMPRESSION / ASSESSMENT AND PLAN / ED COURSE  Possibly anxiety? Consider anemia, new HF vs PE and eval for same.   Workup unremarkable. Unclear etiology of pain but no e/o PE, ACS or other emergent causes at this time. Will continue outpatient workup for same.  Pertinent labs & imaging results that were available during my care of the patient were reviewed by me and considered in my medical decision making (see chart for details).  A medical screening exam was performed and I feel the patient has had an appropriate workup for their chief complaint at this time and likelihood of emergent condition existing is low. They have been counseled on decision, discharge, follow up and which symptoms necessitate immediate return to the emergency department. They or their family verbally stated understanding and agreement with plan and discharged in stable condition.   ____________________________________________  FINAL CLINICAL IMPRESSION(S) / ED DIAGNOSES  Final diagnoses:  Chest pain, unspecified type  Dyspnea, unspecified type     MEDICATIONS GIVEN DURING THIS VISIT:  Medications  albuterol (VENTOLIN HFA) 108 (90 Base) MCG/ACT inhaler 4 puff (4 puffs Inhalation Given 08/16/19 2332)  oxyCODONE-acetaminophen (PERCOCET/ROXICET) 5-325 MG per tablet 1 tablet (1 tablet Oral Given 08/17/19 0314)     NEW OUTPATIENT MEDICATIONS STARTED DURING THIS VISIT:  Discharge Medication List as of 08/17/2019  2:48 AM      Note:  This note was prepared with assistance of Dragon voice recognition software. Occasional wrong-word or sound-a-like substitutions may have occurred due to the inherent limitations of voice recognition software.   Islam Eichinger, Corene Cornea, MD 08/17/19 0425

## 2019-08-16 NOTE — ED Triage Notes (Signed)
Pt with sob for a week and got worse today, pt states she called her PCP this morning and was instructed to come to ED. Pt with CP as well.

## 2019-08-17 ENCOUNTER — Telehealth: Payer: Self-pay | Admitting: Cardiovascular Disease

## 2019-08-17 DIAGNOSIS — R002 Palpitations: Secondary | ICD-10-CM

## 2019-08-17 LAB — CBC WITH DIFFERENTIAL/PLATELET
Abs Immature Granulocytes: 0.01 10*3/uL (ref 0.00–0.07)
Basophils Absolute: 0 10*3/uL (ref 0.0–0.1)
Basophils Relative: 1 %
Eosinophils Absolute: 1 10*3/uL — ABNORMAL HIGH (ref 0.0–0.5)
Eosinophils Relative: 13 %
HCT: 38.5 % (ref 36.0–46.0)
Hemoglobin: 12.7 g/dL (ref 12.0–15.0)
Immature Granulocytes: 0 %
Lymphocytes Relative: 43 %
Lymphs Abs: 3.2 10*3/uL (ref 0.7–4.0)
MCH: 31.4 pg (ref 26.0–34.0)
MCHC: 33 g/dL (ref 30.0–36.0)
MCV: 95.3 fL (ref 80.0–100.0)
Monocytes Absolute: 0.5 10*3/uL (ref 0.1–1.0)
Monocytes Relative: 7 %
Neutro Abs: 2.6 10*3/uL (ref 1.7–7.7)
Neutrophils Relative %: 36 %
Platelets: 221 10*3/uL (ref 150–400)
RBC: 4.04 MIL/uL (ref 3.87–5.11)
RDW: 14.6 % (ref 11.5–15.5)
WBC: 7.3 10*3/uL (ref 4.0–10.5)
nRBC: 0 % (ref 0.0–0.2)

## 2019-08-17 LAB — COMPREHENSIVE METABOLIC PANEL
ALT: 15 U/L (ref 0–44)
AST: 16 U/L (ref 15–41)
Albumin: 4.2 g/dL (ref 3.5–5.0)
Alkaline Phosphatase: 51 U/L (ref 38–126)
Anion gap: 10 (ref 5–15)
BUN: 17 mg/dL (ref 6–20)
CO2: 21 mmol/L — ABNORMAL LOW (ref 22–32)
Calcium: 9 mg/dL (ref 8.9–10.3)
Chloride: 109 mmol/L (ref 98–111)
Creatinine, Ser: 0.72 mg/dL (ref 0.44–1.00)
GFR calc Af Amer: >60 mL/min (ref >60)
GFR calc non Af Amer: >60 mL/min (ref >60)
Glucose, Bld: 89 mg/dL (ref 70–99)
Potassium: 3.6 mmol/L (ref 3.5–5.1)
Sodium: 140 mmol/L (ref 135–145)
Total Bilirubin: 0.5 mg/dL (ref 0.3–1.2)
Total Protein: 6.6 g/dL (ref 6.5–8.1)

## 2019-08-17 LAB — BRAIN NATRIURETIC PEPTIDE: B Natriuretic Peptide: 6 pg/mL (ref 0.0–100.0)

## 2019-08-17 LAB — TROPONIN I (HIGH SENSITIVITY): Troponin I (High Sensitivity): <2 ng/L (ref <18)

## 2019-08-17 LAB — D-DIMER, QUANTITATIVE: D-Dimer, Quant: 0.29 ug/mL-FEU (ref 0.00–0.50)

## 2019-08-17 MED ORDER — OXYCODONE-ACETAMINOPHEN 5-325 MG PO TABS
1.0000 | ORAL_TABLET | Freq: Once | ORAL | Status: AC
  Administered 2019-08-17: 1 via ORAL
  Filled 2019-08-17: qty 1

## 2019-08-17 NOTE — Telephone Encounter (Signed)
Returned pt call. Reminded her of phone call on July 22ND  Where she was given results to her event monitor. She remembered. Will place order for echo.

## 2019-08-20 ENCOUNTER — Telehealth: Payer: Self-pay | Admitting: Cardiovascular Disease

## 2019-08-20 NOTE — Telephone Encounter (Signed)
Patient is scheduled for an echo on 08/27/2019. Patient is experiencing SOB and her heart rate gets up in to the 130's. Would like to speak to a nurse.  --------- Returned call to pt she states that she has been having chest pain and SOB x2 weeks. Denies any other sx. SOB is exertional and while lying down. She went to the ER 08-16-19 and they told her her EKG and lab "was fine".  She states that she continues to have sx she has PCP appt tomorrow to discuss anxiety. I cannot find an appt for f/u. Pt states that she has an apple watch and her HR has been in the 130's. Please advise.

## 2019-08-20 NOTE — Telephone Encounter (Signed)
She had a normal monitor in July Can have f/u echo and ETT for chest pain and dyspnea f/u with [place]

## 2019-08-21 ENCOUNTER — Ambulatory Visit (INDEPENDENT_AMBULATORY_CARE_PROVIDER_SITE_OTHER): Payer: Medicaid Other | Admitting: Family Medicine

## 2019-08-21 ENCOUNTER — Other Ambulatory Visit: Payer: Self-pay

## 2019-08-21 DIAGNOSIS — F419 Anxiety disorder, unspecified: Secondary | ICD-10-CM | POA: Diagnosis not present

## 2019-08-21 DIAGNOSIS — R0602 Shortness of breath: Secondary | ICD-10-CM | POA: Diagnosis not present

## 2019-08-21 MED ORDER — ONDANSETRON 8 MG PO TBDP: 8.0000 mg | ORAL_TABLET | Freq: Three times a day (TID) | ORAL | 1 refills | Status: DC | PRN

## 2019-08-21 NOTE — Progress Notes (Signed)
   Subjective:    Patient ID: Penny [name], female    DOB: [birth year], 32 y.o.   MRN: [id number]  [facility] follow up on anxiety. Has had sob for 2 weeks. Pt states it is worse for the past 3 -4 days, chest pain for 2 weeks. Pt states she has been under a lot of stress. Vomited 4 -5 times last night.  Ate oatmeal this morning. No fever.  Heart rate 112 -130.  This patient was recently at the ER.  She had a chest x-ray.  She also had a d-dimer as well as a BNP The patient states she finds her self feeling anxious nervous nauseous intermittent vomiting she also relates feeling out of breath at times. Cardiology has been working with her they have an echo scheduled coming up The patient also states that she felt like she lost her taste earlier today but not her sense of smell she denies any fever Virtual Visit via Video Note  I connected with Penny [name] on 08/21/19 at 11:30 AM EDT by a video enabled telemedicine application and verified that I am speaking with the correct person using two identifiers.  Location: Patient: home Provider: office   I discussed the limitations of evaluation and management by telemedicine and the availability of in person appointments. The patient expressed understanding and agreed to proceed.  History of Present Illness:    Observations/Objective:   Assessment and Plan:   Follow Up Instructions:    I discussed the assessment and treatment plan with the patient. The patient was provided an opportunity to ask questions and all were answered. The patient agreed with the plan and demonstrated an understanding of the instructions.   The patient was advised to call back or seek an in-person evaluation if the symptoms worsen or if the condition fails to improve as anticipated.  I provided 15 minutes of non-face-to-face time during this encounter.        Review of Systems  Constitutional: Positive for fatigue. Negative for activity change,  appetite change and fever.  HENT: Negative for congestion and rhinorrhea.   Respiratory: Positive for shortness of breath. Negative for cough.   Cardiovascular: Negative for chest pain and leg swelling.  Gastrointestinal: Negative for abdominal pain, nausea and vomiting.  Skin: Negative for color change.  Neurological: Negative for dizziness, weakness, light-headedness and headaches.  Psychiatric/Behavioral: Negative for agitation and confusion.       Objective:   Physical Exam Today's visit was via telephone Physical exam was not possible for this visit        Assessment & Plan:  Overwhelming anxiety continue current medications.  In addition to this we will connect with Dr. Harrington Challenger to see if they could offer some counseling  Shortness of breath more than likely related to the anxiety patient has echo coming up we will do a follow-up in 1 week if ongoing troubles we will do further looking into pulmonary aspect  Patient was counseled to do COVID testing  Patient was also counseled that if she gets worse with her breathing she may need to go back to the emergency department but I tried to reassure her that all of the findings at her visit on the 10th looked good

## 2019-08-21 NOTE — Addendum Note (Signed)
Addended by: Carmelina Noun on: 08/21/2019 01:02 PM   Modules accepted: Orders

## 2019-08-22 NOTE — Telephone Encounter (Signed)
Called pt to discuss Dr. Court Joy recommendations. No answer. Unable to leave message.

## 2019-08-23 ENCOUNTER — Other Ambulatory Visit: Payer: Self-pay

## 2019-08-23 ENCOUNTER — Emergency Department (HOSPITAL_COMMUNITY): Payer: Medicaid Other

## 2019-08-23 ENCOUNTER — Emergency Department (HOSPITAL_COMMUNITY)
Admission: EM | Admit: 2019-08-23 | Discharge: 2019-08-24 | Disposition: A | Discharge status: Home / Self Care | Payer: Medicaid Other | Attending: Emergency Medicine | Admitting: Emergency Medicine

## 2019-08-23 DIAGNOSIS — R569 Unspecified convulsions: Secondary | ICD-10-CM | POA: Diagnosis not present

## 2019-08-23 DIAGNOSIS — Z975 Presence of (intrauterine) contraceptive device: Secondary | ICD-10-CM | POA: Insufficient documentation

## 2019-08-23 DIAGNOSIS — R0789 Other chest pain: Secondary | ICD-10-CM | POA: Diagnosis not present

## 2019-08-23 DIAGNOSIS — F17290 Nicotine dependence, other tobacco product, uncomplicated: Secondary | ICD-10-CM | POA: Insufficient documentation

## 2019-08-23 DIAGNOSIS — R410 Disorientation, unspecified: Secondary | ICD-10-CM | POA: Diagnosis not present

## 2019-08-23 DIAGNOSIS — Z91040 Latex allergy status: Secondary | ICD-10-CM | POA: Diagnosis not present

## 2019-08-23 DIAGNOSIS — R0602 Shortness of breath: Secondary | ICD-10-CM | POA: Diagnosis not present

## 2019-08-23 DIAGNOSIS — Z79899 Other long term (current) drug therapy: Secondary | ICD-10-CM | POA: Diagnosis not present

## 2019-08-23 DIAGNOSIS — I951 Orthostatic hypotension: Secondary | ICD-10-CM

## 2019-08-23 DIAGNOSIS — R079 Chest pain, unspecified: Secondary | ICD-10-CM

## 2019-08-23 DIAGNOSIS — I471 Supraventricular tachycardia: Secondary | ICD-10-CM | POA: Diagnosis not present

## 2019-08-23 DIAGNOSIS — R069 Unspecified abnormalities of breathing: Secondary | ICD-10-CM | POA: Diagnosis not present

## 2019-08-23 LAB — BASIC METABOLIC PANEL
Anion gap: 9 (ref 5–15)
BUN: 19 mg/dL (ref 6–20)
CO2: 17 mmol/L — ABNORMAL LOW (ref 22–32)
Calcium: 9.3 mg/dL (ref 8.9–10.3)
Chloride: 113 mmol/L — ABNORMAL HIGH (ref 98–111)
Creatinine, Ser: 0.78 mg/dL (ref 0.44–1.00)
GFR calc Af Amer: >60 mL/min (ref >60)
GFR calc non Af Amer: >60 mL/min (ref >60)
Glucose, Bld: 101 mg/dL — ABNORMAL HIGH (ref 70–99)
Potassium: 3.8 mmol/L (ref 3.5–5.1)
Sodium: 139 mmol/L (ref 135–145)

## 2019-08-23 LAB — URINALYSIS, ROUTINE W REFLEX MICROSCOPIC
Bilirubin Urine: NEGATIVE
Glucose, UA: NEGATIVE mg/dL
Hgb urine dipstick: NEGATIVE
Ketones, ur: NEGATIVE mg/dL
Nitrite: NEGATIVE
Protein, ur: NEGATIVE mg/dL
Specific Gravity, Urine: 1.009 (ref 1.005–1.030)
pH: 7 (ref 5.0–8.0)

## 2019-08-23 LAB — CBC
HCT: 41.7 % (ref 36.0–46.0)
Hemoglobin: 13.9 g/dL (ref 12.0–15.0)
MCH: 31.3 pg (ref 26.0–34.0)
MCHC: 33.3 g/dL (ref 30.0–36.0)
MCV: 93.9 fL (ref 80.0–100.0)
Platelets: 262 10*3/uL (ref 150–400)
RBC: 4.44 MIL/uL (ref 3.87–5.11)
RDW: 13.6 % (ref 11.5–15.5)
WBC: 8.6 10*3/uL (ref 4.0–10.5)
nRBC: 0 % (ref 0.0–0.2)

## 2019-08-23 MED ORDER — LIDOCAINE VISCOUS HCL 2 % MT SOLN
15.0000 mL | Freq: Once | OROMUCOSAL | Status: AC
  Administered 2019-08-23: 15 mL via ORAL
  Filled 2019-08-23: qty 15

## 2019-08-23 MED ORDER — ALUM & MAG HYDROXIDE-SIMETH 200-200-20 MG/5ML PO SUSP
30.0000 mL | Freq: Once | ORAL | Status: AC
  Administered 2019-08-23: 30 mL via ORAL
  Filled 2019-08-23: qty 30

## 2019-08-23 MED ORDER — MIDAZOLAM HCL 2 MG/2ML IJ SOLN
INTRAMUSCULAR | Status: AC
  Administered 2019-08-23: 2 mg via INTRAVENOUS
  Filled 2019-08-23: qty 2

## 2019-08-23 MED ORDER — MIDAZOLAM HCL (PF) 2 MG/2ML IJ SOLN
1.0000 mg | Freq: Once | INTRAMUSCULAR | Status: AC
  Administered 2019-08-23: 20:00:00 2 mg via INTRAVENOUS

## 2019-08-23 MED ORDER — MIDAZOLAM HCL (PF) 5 MG/ML IJ SOLN: 1.0000 mg | Freq: Once | INTRAMUSCULAR | Status: DC

## 2019-08-23 MED ORDER — SODIUM CHLORIDE 0.9 % IV BOLUS
1000.0000 mL | Freq: Once | INTRAVENOUS | Status: AC
  Administered 2019-08-23: 1000 mL via INTRAVENOUS

## 2019-08-23 NOTE — ED Provider Notes (Signed)
[facility] EMERGENCY DEPARTMENT Provider Note   CSN: [id number] Arrival date & time: 08/23/19  2001     History   Chief Complaint Chief Complaint  Patient presents with  . Shortness of Breath    HPI Penny [name] is a 33 y.o. female.     HPI Patient presented with shortness of breath.  Has had for the last 2 weeks.  States been more severe.  States she gets tightness in her chest with it.  Brought in by EMS.  Reportedly had a cough or shortness of breath but then had a seizure during the episode.  History of same.  Also reportedly went into an SVT with a heart rate of 170.  Patient is dyspneic and wheezing upon arrival to the ER. Past Medical History:  Diagnosis Date  . ADD (attention deficit disorder)   . Anemia 2011   2o to GASTRIC BYPASS  . Anginal pain ([place])   . Anxiety   . Blood transfusion without reported diagnosis   . Chronic daily headache   . Depression   . Depression   . Dysmenorrhea 09/18/2013  . Dysrhythmia   . Elevated liver enzymes JUL 2011ALK PHOS 111-127 AST  143-267 ALT  213-321T BILI 0.6  ALB  3.7-4.06 Jun 2011 ALK PHOS 118 AST 24 ALT 42 T BILI 0.4 ALB 3.9  . Encounter for drug rehabilitation    at behavioral health for opioid addiction about 4 years ago  . Family history of adverse reaction to anesthesia    'dad had to be kept on pump for breathing for morphine'  . Fatty liver   . Fibroid 01/18/2017  . Fibromyalgia   . Gastric bypass status for obesity   . Gastritis JULY 2011  . Heart murmur   . 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 History of Holter monitoring   . Hx of opioid abuse ([place])    for about 4 years, about 4 years ago  . Interstitial cystitis   . Iron deficiency anemia 07/23/2010  . Irritable bowel syndrome 2012 DIARRHEA   JUN 2012 TTG IgA 14.9  . IUD FEB 2010  . Lupus ([place])   . Menorrhagia 07/18/2013  . Migraines   . Obesity (BMI 30-39.9) 2011 228 LBS BMI 36.8  . Ovarian cyst   . Patient  desires pregnancy 09/18/2013  . Polyneuropathy   . PONV (postoperative nausea and vomiting) seizure post-operatively   seizure following ankle surgery  . Potassium (K) deficiency   . Pregnant 12/25/2013  . Psychiatric pseudoseizure   . RLQ abdominal pain 07/18/2013  . Sciatica of left side 11/14/2014  . 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 Stress 09/18/2013    Patient Active Problem List   Diagnosis Date Noted  . Severe anxiety 08/21/2019  . Hematemesis with nausea 04/23/2019  . Absolute anemia 04/23/2019  . Urge incontinence 10/26/2018  . S/P kyphoplasty 08/02/2018  . Herpes genitalis in women 12/19/2017  . Abnormal urine odor 03/30/2017  . Urinary tract infection with hematuria 03/30/2017  . Screening for genitourinary condition 03/30/2017  . Pain 03/30/2017  . Burning with urination 03/30/2017  . Family history of breast cancer in mother,uncertain BR CA status 02/28/2017  . Fibroid 01/18/2017  . Chronic pain syndrome 12/05/2016  . Folate deficiency 12/04/2016  . Nausea vomiting and diarrhea 12/04/2016  . Severe anemia 12/04/2016  . Symptomatic anemia 12/03/2016  . Complaints of leg weakness   . Paresthesia   . Left-sided weakness   .  Uncontrolled pain 01/24/2016  . Malnutrition of moderate degree 01/24/2016  . Neuropathy 01/23/2016  . Inability to walk 01/23/2016  . Paresthesias   . Bilateral leg numbness 11/26/2015  . 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 Post partum depression 05/09/2015  . Anastomotic ulcer   . Dysphagia   . Hematemesis 04/07/2015  . Intractable vomiting with nausea 04/07/2015  . Elevated liver enzymes   . Epigastric pain   . Pseudoseizure ([place]) 02/22/2015  . Seizure-like activity ([place]) 02/22/2015  . Fibromyalgia 02/18/2015  . Vulvar fissure 02/18/2015  . Clinical depression 02/01/2015  . Current smoker 01/29/2015  . Current tobacco use 01/29/2015  . Hereditary and idiopathic  peripheral neuropathy 01/15/2015  . Leg weakness, bilateral 01/02/2015  . Gait disorder 01/02/2015  . Other symptoms and signs involving the musculoskeletal system 01/02/2015  . Abnormal gait 01/02/2015  . Cervicalgia 12/18/2014  . Sciatica of left side 11/14/2014  . Neuralgia neuritis, sciatic nerve 11/14/2014  . 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 Encounter for sterilization 09/09/2014  . Abdominal pain, acute, right lower quadrant 08/22/2014  . Headache, migraine 08/19/2014  . Seizure ([place]) 08/18/2014  . Encephalopathy 08/13/2014  . Headache 08/13/2014  . Altered mental status 08/13/2014  . Right leg numbness 07/31/2014  . Disturbance of skin sensation 07/31/2014  . Hypertension in pregnancy, transient 07/08/2014  . Previous gastric bypass affecting pregnancy, antepartum 05/22/2014  . 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 ASD (atrial septal defect), ostium secundum 05/17/2014  . Depression complicating pregnancy in second trimester, antepartum 05/09/2014  . Antepartum mental disorder in pregnancy 05/09/2014  . Rapid palpitations 02/18/2014  . H/O maternal third degree perineal laceration, currently pregnant 02/18/2014  . High-risk pregnancy 02/18/2014  . Supervision of pregnancy with other poor reproductive or obstetric history, unspecified trimester 02/18/2014  . Restless legs 01/16/2014  . Restless leg 01/16/2014  . Chronic interstitial cystitis 10/23/2013  . Menorrhagia 07/18/2013  . Excessive and frequent menstruation 07/18/2013  . h/o Opiate addiction 03/11/2012    Class: Acute  . History of migraine headaches 03/10/2012    Class: Acute  . Depression with anxiety 03/10/2012    Class: Chronic  . 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 Panic disorder without agoraphobia 03/10/2012  . H/O disease 03/10/2012  .  Dysthymia 03/10/2012  . Pelvic congestion syndrome 10/13/2011  . Coitalgia 10/13/2011  . Chronic migraine without aura 10/13/2011  . Unspecified dyspareunia 10/13/2011  . IBS (irritable bowel syndrome) 08/25/2011  . Diarrhea 05/27/2011  . OBESITY, UNSPECIFIED 09/17/2010  . Transaminitis 09/17/2010  . IDA (iron deficiency anemia) 07/23/2010    Past Surgical History:  Procedure Laterality Date  . BIOPSY  04/10/2015   Procedure: BIOPSY;  Surgeon: Daneil Dolin, MD;  Location: [facility];  Service: Endoscopy;;  . cath self every nite     for sodium bicarb injection (discontinued 2013)  . CHOLECYSTECTOMY  2005   biliary dyskinesia  . COLONOSCOPY  JUN 2012 ABD PN/DIARRHEA WITH PROPOFOL   NL COLON  . DILATION AND CURETTAGE OF UTERUS    . DILITATION & CURRETTAGE/HYSTROSCOPY WITH NOVASURE ABLATION N/A 03/24/2017   Procedure: DILATATION & CURETTAGE/HYSTEROSCOPY WITH NOVASURE ENDOMETRIAL ABLATION;  Surgeon: Jonnie Kind, MD;  Location: [facility];  Service: Gynecology;  Laterality: N/A;  . ESOPHAGEAL DILATION N/A 04/10/2015   Procedure: ESOPHAGEAL DILATION  WITH 54FR [place];  Surgeon: Daneil Dolin, MD;  Location: [facility];  Service: Endoscopy;  Laterality: N/A;  . ESOPHAGOGASTRODUODENOSCOPY    . ESOPHAGOGASTRODUODENOSCOPY (EGD) WITH PROPOFOL N/A 04/10/2015   Procedure: ESOPHAGOGASTRODUODENOSCOPY (EGD) WITH PROPOFOL;  Surgeon: Daneil Dolin, MD;  Location: [facility];  Service: Endoscopy;  Laterality: N/A;  . ESOPHAGOGASTRODUODENOSCOPY (EGD) WITH PROPOFOL N/A 12/06/2016   Procedure: ESOPHAGOGASTRODUODENOSCOPY (EGD) WITH PROPOFOL;  Surgeon: Danie Binder, MD;  [facility];  Service: Endoscopy;  Laterality: N/A;  . ESOPHAGOGASTRODUODENOSCOPY (EGD) WITH PROPOFOL N/A 04/27/2019   Procedure: ESOPHAGOGASTRODUODENOSCOPY (EGD) WITH PROPOFOL;  Surgeon: Rogene Houston, MD;  [facility];  Service: Endoscopy;  Laterality: N/A;  730  . GAB  2007   in [place]-POUCH 5 CM  . GASTRIC  BYPASS  06/2006  . HYSTEROSCOPY W/D&C N/A 09/12/2014   Procedure: DILATATION AND CURETTAGE /HYSTEROSCOPY;  Surgeon: Jonnie Kind, MD;  Location: [facility];  Service: Gynecology;  Laterality: N/A;  . KYPHOPLASTY N/A 08/02/2018   Procedure: KYPHOPLASTY T12;  Surgeon: Melina Schools, MD;  Location: [place];  Service: Orthopedics;  Laterality: N/A;  60 mins  . LAPAROSCOPIC TUBAL LIGATION Bilateral 03/24/2017   Procedure: LAPAROSCOPIC TUBAL LIGATION ([place] Rings);  Surgeon: Jonnie Kind, MD;  Location: [facility];  Service: Gynecology;  Laterality: Bilateral;  . REPAIR VAGINAL CUFF N/A 07/30/2014   Procedure: REPAIR VAGINAL CUFF;  Surgeon: Mora Bellman, MD;  Location: [place] [facility];  Service: Gynecology;  Laterality: N/A;  . SAVORY DILATION  06/20/2012   Dr. Barnie Alderman gastritis/Ulcer in the mid jejunum. Empiric dilation.   . SURAL NERVE BX Left 02/25/2016   Procedure: LEFT SURAL NERVE BIOPSY;  Surgeon: Jovita Gamma, MD;  Location: [place] [facility];  Service: Neurosurgery;  Laterality: Left;  Left sural nerve biopsy  . TONSILLECTOMY    . TONSILLECTOMY AND ADENOIDECTOMY    . UPPER GASTROINTESTINAL ENDOSCOPY  JULY 2011 NAUSEA-D125,V6, PH 25   Bx; GASTRITIS, POUCH-5 CM LONG  . WISDOM TOOTH EXTRACTION       OB History    Gravida  3   Para  2   Term  1   Preterm  1   AB  1   Living  2     SAB  1   TAB      Ectopic      Multiple      Live Births  2            Home Medications    Prior to Admission medications   Medication Sig Start Date End Date Taking? Authorizing Provider  amphetamine-dextroamphetamine (ADDERALL XR) 30 MG 24 hr capsule TAKE (1) CAPSULE BY MOUTH EVERY DAY. 07/25/19   Cloria Spring, MD  amphetamine-dextroamphetamine (ADDERALL XR) 30 MG 24 hr capsule Take 1 capsule (30 mg total) by mouth daily. 07/25/19   Cloria Spring, MD  Capsaicin-Menthol-Methyl Sal (CAPSAICIN-METHYL SAL-MENTHOL) 0.025-1-12 % CREA Apply 1 application topically 4 (four) times daily as needed (as  needed for muscle spasms and low back pain). Patient not taking: Reported on 08/21/2019 07/14/19   Noe Gens, PA-C  carbamazepine (TEGRETOL) 200 MG tablet Take 2 tablets (400 mg total) by mouth at bedtime. 07/25/19   Cloria Spring, MD  clonazePAM (KLONOPIN) 0.5 MG tablet TAKE 1 TABLET BY MOUTH THREE TIMES A DAY 08/14/19   Cloria Spring, MD  cyclobenzaprine (FLEXERIL) 5 MG tablet Take 1-2 tablets (5-10 mg total) by mouth 2 (two) times daily as needed  for muscle spasms. Patient not taking: Reported on 08/21/2019 07/14/19   Noe Gens, PA-C  DULoxetine (CYMBALTA) 60 MG capsule Take 1 capsule (60 mg total) by mouth 2 (two) times daily. 07/25/19   Cloria Spring, MD  ondansetron (ZOFRAN ODT) 8 MG disintegrating tablet Take 1 tablet (8 mg total) by mouth every 8 (eight) hours as needed for nausea or vomiting. 08/21/19   Kathyrn Drown, MD  pantoprazole (PROTONIX) 40 MG tablet Take 1 tablet (40 mg total) by mouth 2 (two) times daily before a meal. 04/27/19   Rehman, Mechele Dawley, MD  PROAIR HFA 108 (90 Base) MCG/ACT inhaler INHALE 2 PUFFS INTO THE LUNGS EVERY 6 HOURS AS NEEDED. Patient taking differently: Inhale 2 puffs into the lungs every 6 (six) hours as needed for wheezing or shortness of breath.  06/14/17   Kathyrn Drown, MD  promethazine (PHENERGAN) 25 MG tablet TAKE ONE TABLET BY MOUTH EVERY 6 HOURS AS NEEDED. Patient taking differently: Take 25 mg by mouth every 6 (six) hours as needed for nausea or vomiting.  07/03/18   Estill Dooms, NP  topiramate (TOPAMAX) 50 MG tablet TAKE ONE IN THE AM AND 2 AT NIGHT 08/06/19   Cloria Spring, MD  valACYclovir (VALTREX) 1000 MG tablet Take 1 bid for 10 days then Take  1 daily for suppression 05/03/19   Estill Dooms, NP  zolpidem (AMBIEN) 10 MG tablet Take 1 tablet (10 mg total) by mouth at bedtime. 07/25/19 08/24/19  Cloria Spring, MD    Family History Family History  Problem Relation Age of Onset  . Hemochromatosis Maternal Grandmother   .  Migraines Maternal Grandmother   . Cancer Maternal Grandmother   . Breast cancer Maternal Grandmother   . Hypertension Father   . Diabetes Father   . Coronary artery disease Father   . Migraines Paternal Grandmother   . Breast cancer Paternal Grandmother   . Cancer Mother        breast  . Hemochromatosis Mother   . Breast cancer Mother   . Depression Mother   . Anxiety disorder Mother   . Coronary artery disease Paternal Grandfather   . Anxiety disorder Brother   . Bipolar disorder Brother   . Healthy Daughter   . Healthy Son     Social History Social History   Tobacco Use  . Smoking status: Current Every Day Smoker    Packs/day: 0.25    Years: 6.00    Pack years: 1.50    Types: E-cigarettes    Start date: 05/2018  . Smokeless tobacco: Never Used  Substance Use Topics  . Alcohol use: Yes    Alcohol/week: 0.0 standard drinks    Comment: 1-2 beers a couple times a month   . Drug use: Not Currently    Types: Marijuana    Comment: Previously opioid addiction went through rehab     Allergies   Gabapentin, Metoclopramide hcl, Tramadol, Ativan [lorazepam], Latex, Lyrica [pregabalin], Trazodone and nefazodone, Zofran [ondansetron], Sulfa antibiotics, Butrans [buprenorphine], Nucynta [tapentadol], Propofol, Tape, and Toradol [ketorolac tromethamine]   Review of Systems Review of Systems  Constitutional: Positive for fatigue. Negative for fever.  HENT: Negative for congestion.   Respiratory: Positive for shortness of breath and wheezing.   Cardiovascular: Positive for chest pain.  Gastrointestinal: Negative for abdominal pain.  Genitourinary: Negative for flank pain.  Musculoskeletal: Negative for back pain.  Skin: Negative for rash.  Neurological: Negative for weakness.  Psychiatric/Behavioral: The patient  is nervous/anxious.      Physical Exam Updated Vital Signs BP 112/76   Pulse (!) 104   Resp 20   Ht '5\' 6"'$  (1.676 m)   Wt 83.9 kg   SpO2 100%   BMI 29.86  kg/m   Physical Exam Vitals signs and nursing note reviewed.  HENT:     Head: Atraumatic.  Cardiovascular:     Rate and Rhythm: Tachycardia present.  Pulmonary:     Comments: Tachypnea and shallow breaths with wheezes. Chest:     Chest wall: No tenderness.  Abdominal:     Tenderness: There is no abdominal tenderness.  Musculoskeletal:     Right lower leg: She exhibits no tenderness.     Left lower leg: She exhibits no tenderness.  Skin:    General: Skin is warm.     Capillary Refill: Capillary refill takes less than 2 seconds.  Neurological:     Mental Status: She is alert and oriented to person, place, and time.  Psychiatric:        Mood and Affect: Mood is anxious.      ED Treatments / Results  Labs (all labs ordered are listed, but only abnormal results are displayed) Labs Reviewed  URINALYSIS, ROUTINE W REFLEX MICROSCOPIC - Abnormal; Notable for the following components:      Result Value   APPearance HAZY (*)    Leukocytes,Ua TRACE (*)    Bacteria, UA RARE (*)    All other components within normal limits  CBC  BASIC METABOLIC PANEL    EKG EKG Interpretation  Date/Time:  Thursday August 23 2019 20:06:11 EDT Ventricular Rate:  168 PR Interval:    QRS Duration: 86 QT Interval:  310 QTC Calculation: 519 R Axis:   96 Text Interpretation:  Sinus tachycardia Probable left atrial enlargement Borderline right axis deviation Anteroseptal infarct, old Borderline ST depression, lateral leads Prolonged QT interval Artifact in lead(s) I II III aVR aVL aVF V3 V4 V5 V6 and baseline wander in lead(s) II III aVL aVF V1 V2 V4 V5 V6 Confirmed by Davonna Belling [phone]) on 08/23/2019 8:19:57 PM   Radiology Dg Chest Portable 1 View  Result Date: 08/23/2019 CLINICAL DATA:  Shortness of breath EXAM: PORTABLE CHEST 1 VIEW COMPARISON:  08/16/2019 FINDINGS: The heart size and mediastinal contours are within normal limits. Both lungs are clear. The visualized skeletal  structures are unremarkable. Mild patient rotation to the right accentuates the mediastinal markings. IMPRESSION: No active disease. Electronically Signed   By: Inez Catalina M.D.   On: 08/23/2019 20:58    Procedures Procedures (including critical care time)  Medications Ordered in ED Medications  midazolam PF (VERSED) injection 1 mg (1 mg Intravenous Not Given 08/23/19 2041)  alum & mag hydroxide-simeth (MAALOX/MYLANTA) 200-200-20 MG/5ML suspension 30 mL (30 mLs Oral Given 08/23/19 2212)    And  lidocaine (XYLOCAINE) 2 % viscous mouth solution 15 mL (15 mLs Oral Given 08/23/19 2212)  sodium chloride 0.9 % bolus 1,000 mL (1,000 mLs Intravenous New Bag/Given 08/23/19 2329)     Initial Impression / Assessment and Plan / ED Course  I have reviewed the triage vital signs and the nursing notes.  Pertinent labs & imaging results that were available during my care of the patient were reviewed by me and considered in my medical decision making (see chart for details).       Patient presented with shortness of breath and tachycardia.  Heart rate is some proved.  X-ray reassuring.  Has  had previous stents of work-up for this without clear cause but have ruled out cardiac cause pulmonary embolism and aortic dissection in the past.  Tenderness on left chest could be musculoskeletal.  Was lightheaded with standing.  Fluid bolus will be given.  Some blood work will be added now.  Patient was later complaining of dysuria and urinalysis added.  Care turned over to Dr. Tomi Bamberger.  Final Clinical Impressions(s) / ED Diagnoses   Final diagnoses:  Nonspecific chest pain    ED Discharge Orders    None       Davonna Belling, MD 08/23/19 2335

## 2019-08-23 NOTE — ED Triage Notes (Signed)
Pt arrived via [facility] with sob and tacycardia 160's.   EMS reports pt appeared to be SVT at one time, decreased rate on arrival.   Pt arrives on 100% O2 NRB.  Pt c/o chest pressure

## 2019-08-24 ENCOUNTER — Telehealth: Payer: Self-pay | Admitting: Cardiovascular Disease

## 2019-08-24 NOTE — Telephone Encounter (Signed)
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

## 2019-08-24 NOTE — Telephone Encounter (Signed)
I spoke with husband, states wife is still ill, just released from ED yesterday. Dr.Nishan is not in the office this afternoon.I suggested he take her back to the ED for evaluation and he agrees.

## 2019-08-24 NOTE — Discharge Instructions (Addendum)
You can take ibuprofen for your pain.  Please keep your appointments with Dr. Johnsie Cancel to finish the evaluation of your chest pain and your orthostatic hypotension.  You could have a condition called POTS and Dr. Johnsie Cancel is doing the tests to evaluate you for that.

## 2019-08-24 NOTE — Telephone Encounter (Signed)
New Message:    Pt was seen in [facility] ER last for Shortness of breath and Chest Pain. Pt was told to see her Cardiologist. Husband would like for pt to be seen today. Pt is still real short of breath.

## 2019-08-24 NOTE — ED Provider Notes (Signed)
Patient left a change of shift to get the results of her blood work.  Nurses report patient is taking her IV out and wants to leave.  She states she has been seen by Dr. Johnsie Cancel, cardiology,  and has had a Holter monitor that was normal.  She is getting a echocardiogram done.  When we discussed that she was anemic earlier in the year she states she has had iron infusion and now she is no longer anemic.  They are very frustrated because they want to know what is causing her symptoms.  The mother is especially concerned about her orthostatic hypotension.  I discussed that she could have is a problem called POTS but Dr. Johnsie Cancel is the specialist that needs to determine that and can decide if she needs to be on medicine for it.  Results for orders placed or performed during the hospital encounter of 08/23/19  Urinalysis, Routine w reflex microscopic  Result Value Ref Range   Color, Urine YELLOW YELLOW   APPearance HAZY (A) CLEAR   Specific Gravity, Urine 1.009 1.005 - 1.030   pH 7.0 5.0 - 8.0   Glucose, UA NEGATIVE NEGATIVE mg/dL   Hgb urine dipstick NEGATIVE NEGATIVE   Bilirubin Urine NEGATIVE NEGATIVE   Ketones, ur NEGATIVE NEGATIVE mg/dL   Protein, ur NEGATIVE NEGATIVE mg/dL   Nitrite NEGATIVE NEGATIVE   Leukocytes,Ua TRACE (A) NEGATIVE   RBC / HPF 0-5 0 - 5 RBC/hpf   WBC, UA 0-5 0 - 5 WBC/hpf   Bacteria, UA RARE (A) NONE SEEN   Squamous Epithelial / LPF 6-10 0 - 5  Basic metabolic panel  Result Value Ref Range   Sodium 139 135 - 145 mmol/L   Potassium 3.8 3.5 - 5.1 mmol/L   Chloride 113 (H) 98 - 111 mmol/L   CO2 17 (L) 22 - 32 mmol/L   Glucose, Bld 101 (H) 70 - 99 mg/dL   BUN 19 6 - 20 mg/dL   Creatinine, Ser 0.78 0.44 - 1.00 mg/dL   Calcium 9.3 8.9 - 10.3 mg/dL   GFR calc non Af Amer >60 >60 mL/min   GFR calc Af Amer >60 >60 mL/min   Anion gap 9 5 - 15  CBC  Result Value Ref Range   WBC 8.6 4.0 - 10.5 K/uL   RBC 4.44 3.87 - 5.11 MIL/uL   Hemoglobin 13.9 12.0 - 15.0 g/dL   HCT  41.7 36.0 - 46.0 %   MCV 93.9 80.0 - 100.0 fL   MCH 31.3 26.0 - 34.0 pg   MCHC 33.3 30.0 - 36.0 g/dL   RDW 13.6 11.5 - 15.5 %   Platelets 262 150 - 400 K/uL   nRBC 0.0 0.0 - 0.2 %   Dg Chest Portable 1 View  Result Date: 08/23/2019 CLINICAL DATA:  Shortness of breath EXAM: PORTABLE CHEST 1 VIEW COMPARISON:  08/16/2019 FINDINGS: The heart size and mediastinal contours are within normal limits. Both lungs are clear. The visualized skeletal structures are unremarkable. Mild patient rotation to the right accentuates the mediastinal markings. IMPRESSION: No active disease. Electronically Signed   By: Inez Catalina M.D.   On: 08/23/2019 20:58   Dg Chest Portable 1 View  Result Date: 08/16/2019 CLINICAL DATA:  Shortness of breath, chest pain EXAM: PORTABLE CHEST 1 VIEW COMPARISON:  04/21/2019 FINDINGS: The heart size and mediastinal contours are within normal limits. Both lungs are clear. The visualized skeletal structures are unremarkable. IMPRESSION: No acute abnormality of the lungs in [facility] portable  projection. Electronically Signed   By: Eddie Candle M.D.   On: 08/16/2019 21:17   Diagnoses that have been ruled out:  None  Diagnoses that are still under consideration:  None  Final diagnoses:  Nonspecific chest pain  Chest pain, unspecified type  Orthostatic hypotension   Plan discharge  Rolland Porter, MD, Barbette Or, MD 08/24/19 [phone]

## 2019-08-25 ENCOUNTER — Emergency Department (HOSPITAL_COMMUNITY): Payer: Medicaid Other

## 2019-08-25 ENCOUNTER — Emergency Department (HOSPITAL_COMMUNITY)
Admission: EM | Admit: 2019-08-25 | Discharge: 2019-08-25 | Disposition: A | Discharge status: Home / Self Care | Payer: Medicaid Other | Attending: Emergency Medicine | Admitting: Emergency Medicine

## 2019-08-25 ENCOUNTER — Other Ambulatory Visit: Payer: Self-pay

## 2019-08-25 DIAGNOSIS — R0789 Other chest pain: Secondary | ICD-10-CM | POA: Insufficient documentation

## 2019-08-25 DIAGNOSIS — R0602 Shortness of breath: Secondary | ICD-10-CM | POA: Diagnosis not present

## 2019-08-25 DIAGNOSIS — F17290 Nicotine dependence, other tobacco product, uncomplicated: Secondary | ICD-10-CM | POA: Diagnosis not present

## 2019-08-25 DIAGNOSIS — Z79899 Other long term (current) drug therapy: Secondary | ICD-10-CM | POA: Diagnosis not present

## 2019-08-25 DIAGNOSIS — R109 Unspecified abdominal pain: Secondary | ICD-10-CM | POA: Diagnosis not present

## 2019-08-25 DIAGNOSIS — Z91040 Latex allergy status: Secondary | ICD-10-CM | POA: Diagnosis not present

## 2019-08-25 DIAGNOSIS — R1084 Generalized abdominal pain: Secondary | ICD-10-CM | POA: Insufficient documentation

## 2019-08-25 LAB — BASIC METABOLIC PANEL
Anion gap: 11 (ref 5–15)
BUN: 11 mg/dL (ref 6–20)
CO2: 20 mmol/L — ABNORMAL LOW (ref 22–32)
Calcium: 9.2 mg/dL (ref 8.9–10.3)
Chloride: 110 mmol/L (ref 98–111)
Creatinine, Ser: 0.79 mg/dL (ref 0.44–1.00)
GFR calc Af Amer: >60 mL/min (ref >60)
GFR calc non Af Amer: >60 mL/min (ref >60)
Glucose, Bld: 71 mg/dL (ref 70–99)
Potassium: 4 mmol/L (ref 3.5–5.1)
Sodium: 141 mmol/L (ref 135–145)

## 2019-08-25 LAB — CBC
HCT: 41.3 % (ref 36.0–46.0)
Hemoglobin: 14 g/dL (ref 12.0–15.0)
MCH: 32 pg (ref 26.0–34.0)
MCHC: 33.9 g/dL (ref 30.0–36.0)
MCV: 94.3 fL (ref 80.0–100.0)
Platelets: 279 10*3/uL (ref 150–400)
RBC: 4.38 MIL/uL (ref 3.87–5.11)
RDW: 13.9 % (ref 11.5–15.5)
WBC: 8.6 10*3/uL (ref 4.0–10.5)
nRBC: 0 % (ref 0.0–0.2)

## 2019-08-25 LAB — LIPASE, BLOOD: Lipase: 32 U/L (ref 11–51)

## 2019-08-25 LAB — TROPONIN I (HIGH SENSITIVITY)
Troponin I (High Sensitivity): 6 ng/L (ref <18)
Troponin I (High Sensitivity): <2 ng/L (ref <18)

## 2019-08-25 LAB — T4, FREE: Free T4: 0.79 ng/dL (ref 0.61–1.12)

## 2019-08-25 LAB — D-DIMER, QUANTITATIVE: D-Dimer, Quant: 0.3 ug/mL-FEU (ref 0.00–0.50)

## 2019-08-25 LAB — I-STAT BETA HCG BLOOD, ED (MC, WL, AP ONLY): I-stat hCG, quantitative: <5 m[IU]/mL (ref <5)

## 2019-08-25 LAB — TSH: TSH: 2.616 u[IU]/mL (ref 0.350–4.500)

## 2019-08-25 MED ORDER — IOHEXOL 300 MG/ML  SOLN
100.0000 mL | Freq: Once | INTRAMUSCULAR | Status: AC | PRN
  Administered 2019-08-25: 100 mL via INTRAVENOUS

## 2019-08-25 MED ORDER — HYDROMORPHONE HCL 1 MG/ML IJ SOLN: 0.5000 mg | Freq: Once | INTRAMUSCULAR | Status: DC

## 2019-08-25 MED ORDER — SODIUM CHLORIDE 0.9 % IV BOLUS
1000.0000 mL | Freq: Once | INTRAVENOUS | Status: AC
  Administered 2019-08-25: 1000 mL via INTRAVENOUS

## 2019-08-25 MED ORDER — PREDNISONE 20 MG PO TABS: ORAL_TABLET | ORAL | 0 refills | Status: DC

## 2019-08-25 MED ORDER — LORAZEPAM 2 MG/ML IJ SOLN
1.0000 mg | Freq: Once | INTRAMUSCULAR | Status: DC
  Filled 2019-08-25: qty 1

## 2019-08-25 MED ORDER — METHYLPREDNISOLONE SODIUM SUCC 125 MG IJ SOLR
125.0000 mg | Freq: Once | INTRAMUSCULAR | Status: AC
  Administered 2019-08-25: 125 mg via INTRAVENOUS
  Filled 2019-08-25: qty 2

## 2019-08-25 MED ORDER — ACETAMINOPHEN 500 MG PO TABS: 1000.0000 mg | ORAL_TABLET | Freq: Once | ORAL | Status: DC

## 2019-08-25 NOTE — ED Notes (Signed)
Pt c/o of worsening pain. Stated "my pain is worst after the solumedrol". Vital sign remained stable. No rashes / redness noted. Will notify provider of pt complaint

## 2019-08-25 NOTE — ED Notes (Signed)
Pt stated "I don't feel well" Asked pt to elaborate and stated, "I feel weak all over and nausea."

## 2019-08-25 NOTE — ED Notes (Signed)
Pt returned from CT. Noted bad data from validated VS; repositioned pt BP cuff.

## 2019-08-25 NOTE — ED Notes (Signed)
Entered room to discharge pt. She removed monitoring equipment and IV and would not allow me to assist. She answered very few of my questions and was not willing to work with me.

## 2019-08-25 NOTE — ED Notes (Signed)
Opportunity for questioning and answers were provided.

## 2019-08-25 NOTE — Discharge Instructions (Signed)
Follow-up with primary doctor for further evaluation. Use albuterol every 3-4 hours for wheezing as needed. Return for new or worsening symptoms.

## 2019-08-25 NOTE — ED Triage Notes (Addendum)
Pt endorses shob with leftsided rib pain x 2 weeks. Rib pain is worse after eating. Tachy 122. Has been to [facility] twice for same. Denies fever or chills.

## 2019-08-25 NOTE — ED Provider Notes (Signed)
[name] EMERGENCY DEPARTMENT Provider Note   CSN: [id number] Arrival date & time: 08/25/19  1804     History   Chief Complaint Chief Complaint  Patient presents with   Shortness of Breath    HPI Penny [name] is a 33 y.o. female with history of ADD, anxiety, depression, lupus, opioid addiction, fibromyalgia, pseudoseizures presents with ongoing chest pain and shortness of breath for 2 weeks as well as development of abdominal pain nausea and vomiting for 3 days.  She reports constant shortness of breath for the last 2 weeks.  She describes sharp pain to the substernal region and left side of the chest which worsens with deep inspiration, certain movements, and after eating.  States she feels generally very weak.  Also notes generalized abdominal pain worse along the left side of the abdomen and has had a few episodes of nonbloody nonbilious emesis over the last 3 days.  She reports she has been constipated over the last week and has had difficulty passing gas.  Notes some urinary urgency and frequency and states that she is prone to urinary tract infections.  She has been taking her home medications including using her albuterol every 6 hours with little relief.  He has been seen twice at [facility] emergency department, most recently 2 days ago for her chest pain and shortness of breath which have been reassuring but she did not discuss her abdominal pain per the patient.  He is status post gastric bypass surgery and cholecystectomy.  She reports that she vapes but does not smoke cigarettes, does smoke marijuana occasionally.  She is followed by Dr. Johnsie Cancel with cardiology and had Holter monitoring in July which was unremarkable.  She is scheduled for an echo on Monday.  Denies recent travel or surgery, no hemoptysis, no prior history of DVT or PE.  She is not on hormone replacement therapy.    The history is provided by the patient.    Past Medical History:    Diagnosis Date   ADD (attention deficit disorder)    Anemia 2011   2o to GASTRIC BYPASS   Anginal pain ([place])    Anxiety    Blood transfusion without reported diagnosis    Chronic daily headache    Depression    Depression    Dysmenorrhea 09/18/2013   Dysrhythmia    Elevated liver enzymes JUL 2011ALK PHOS 111-127 AST  143-267 ALT  213-321T BILI 0.6  ALB  3.7-4.06 Jun 2011 ALK PHOS 118 AST 24 ALT 42 T BILI 0.4 ALB 3.9   Encounter for drug rehabilitation    at behavioral health for opioid addiction about 4 years ago   Family history of adverse reaction to anesthesia    'dad had to be kept on pump for breathing for morphine'   Fatty liver    Fibroid 01/18/2017   Fibromyalgia    Gastric bypass status for obesity    Gastritis JULY 2011   Heart murmur    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   History of Holter monitoring    Hx of opioid abuse ([place])    for about 4 years, about 4 years ago   Interstitial cystitis    Iron deficiency anemia 07/23/2010   Irritable bowel syndrome 2012 DIARRHEA   JUN 2012 TTG IgA 14.9   IUD FEB 2010   Lupus ([place])    Menorrhagia 07/18/2013   Migraines    Obesity (BMI 30-39.9) 2011 228 LBS BMI  36.8   Ovarian cyst    Patient desires pregnancy 09/18/2013   Polyneuropathy    PONV (postoperative nausea and vomiting) seizure post-operatively   seizure following ankle surgery   Potassium (K) deficiency    Pregnant 12/25/2013   Psychiatric pseudoseizure    RLQ abdominal pain 07/18/2013   Sciatica of left side 11/14/2014   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   Stress 09/18/2013    Patient Active Problem List   Diagnosis Date Noted   Severe anxiety 08/21/2019   Hematemesis with nausea 04/23/2019   Absolute anemia 04/23/2019   Urge incontinence 10/26/2018   S/P kyphoplasty 08/02/2018   Herpes genitalis in women 12/19/2017   Abnormal urine odor 03/30/2017   Urinary tract  infection with hematuria 03/30/2017   Screening for genitourinary condition 03/30/2017   Pain 03/30/2017   Burning with urination 03/30/2017   Family history of breast cancer in mother,uncertain BR CA status 02/28/2017   Fibroid 01/18/2017   Chronic pain syndrome 12/05/2016   Folate deficiency 12/04/2016   Nausea vomiting and diarrhea 12/04/2016   Severe anemia 12/04/2016   Symptomatic anemia 12/03/2016   Complaints of leg weakness    Paresthesia    Left-sided weakness    Uncontrolled pain 01/24/2016   Malnutrition of moderate degree 01/24/2016   Neuropathy 01/23/2016   Inability to walk 01/23/2016   Paresthesias    Bilateral leg numbness 11/26/2015   Major depressive disorder, recurrent episode, 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[place]) 05/10/2015   Post partum depression 05/09/2015   Anastomotic ulcer    Dysphagia    Hematemesis 04/07/2015   Intractable vomiting with nausea 04/07/2015   Elevated liver enzymes    Epigastric pain    Pseudoseizure ([place]) 02/22/2015   Seizure-like activity ([place]) 02/22/2015   Fibromyalgia 02/18/2015   Vulvar fissure 02/18/2015   Clinical depression 02/01/2015   Current smoker 01/29/2015   Current tobacco use 01/29/2015   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   Leg weakness, bilateral 01/02/2015   Gait disorder 01/02/2015   Other symptoms and signs involving the musculoskeletal system 01/02/2015   Abnormal gait 01/02/2015   Cervicalgia 12/18/2014   Sciatica of left side 11/14/2014   Neuralgia neuritis, sciatic nerve 11/14/2014   Pseudoseizures 09/12/2014   Encounter for sterilization 09/09/2014   Abdominal pain, acute, right lower quadrant 08/22/2014   Headache, migraine 08/19/2014   Seizure ([place]) 08/18/2014   Encephalopathy 08/13/2014   Headache 08/13/2014   Altered mental status 08/13/2014   Right leg numbness 07/31/2014   Disturbance of skin sensation 07/31/2014    Hypertension in pregnancy, transient 07/08/2014   Previous gastric bypass affecting pregnancy, antepartum 05/22/2014   Patent foramen ovale with right to left shunt 05/17/2014   ASD (atrial septal defect), ostium secundum 05/17/2014   Depression complicating pregnancy in second trimester, antepartum 05/09/2014   Antepartum mental disorder in pregnancy 05/09/2014   Rapid palpitations 02/18/2014   H/O maternal third degree perineal laceration, currently pregnant 02/18/2014   High-risk pregnancy 02/18/2014   Supervision of pregnancy with other poor reproductive or obstetric history, unspecified trimester 02/18/2014   Restless legs 01/16/2014   Restless leg 01/16/2014   Chronic interstitial cystitis 10/23/2013   Menorrhagia 07/18/2013   Excessive and frequent menstruation 07/18/2013   h/o Opiate addiction 03/11/2012    Class: Acute   History of migraine headaches 03/10/2012    Class: Acute   Depression with anxiety 03/10/2012    Class: Chronic   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 03/10/2012  Class: Chronic   ADD (attention deficit disorder) without hyperactivity 03/10/2012    Class: Chronic   Panic disorder without agoraphobia 03/10/2012   H/O disease 03/10/2012   Dysthymia 03/10/2012   Pelvic congestion syndrome 10/13/2011   Coitalgia 10/13/2011   Chronic migraine without aura 10/13/2011   Unspecified dyspareunia 10/13/2011   IBS (irritable bowel syndrome) 08/25/2011   Diarrhea 05/27/2011   OBESITY, UNSPECIFIED 09/17/2010   Transaminitis 09/17/2010   IDA (iron deficiency anemia) 07/23/2010    Past Surgical History:  Procedure Laterality Date   BIOPSY  04/10/2015   Procedure: BIOPSY;  Surgeon: Daneil Dolin, MD;  Location: [facility];  Service: Endoscopy;;   cath self every nite     for sodium bicarb injection (discontinued 2013)   CHOLECYSTECTOMY  2005   biliary dyskinesia   COLONOSCOPY  JUN 2012 ABD PN/DIARRHEA  WITH PROPOFOL   NL COLON   DILATION AND CURETTAGE OF UTERUS     DILITATION & CURRETTAGE/HYSTROSCOPY WITH NOVASURE ABLATION N/A 03/24/2017   Procedure: DILATATION & CURETTAGE/HYSTEROSCOPY WITH NOVASURE ENDOMETRIAL ABLATION;  Surgeon: Jonnie Kind, MD;  Location: [facility];  Service: Gynecology;  Laterality: N/A;   ESOPHAGEAL DILATION N/A 04/10/2015   Procedure: ESOPHAGEAL DILATION WITH 54FR MALONEY DILATOR;  Surgeon: Daneil Dolin, MD;  Location: [facility];  Service: Endoscopy;  Laterality: N/A;   ESOPHAGOGASTRODUODENOSCOPY     ESOPHAGOGASTRODUODENOSCOPY (EGD) WITH PROPOFOL N/A 04/10/2015   Procedure: ESOPHAGOGASTRODUODENOSCOPY (EGD) WITH PROPOFOL;  Surgeon: Daneil Dolin, MD;  Location: [facility];  Service: Endoscopy;  Laterality: N/A;   ESOPHAGOGASTRODUODENOSCOPY (EGD) WITH PROPOFOL N/A 12/06/2016   Procedure: ESOPHAGOGASTRODUODENOSCOPY (EGD) WITH PROPOFOL;  Surgeon: Danie Binder, MD;  [facility];  Service: Endoscopy;  Laterality: N/A;   ESOPHAGOGASTRODUODENOSCOPY (EGD) WITH PROPOFOL N/A 04/27/2019   Procedure: ESOPHAGOGASTRODUODENOSCOPY (EGD) WITH PROPOFOL;  Surgeon: Rogene Houston, MD;  [facility];  Service: Endoscopy;  Laterality: N/A;  730   GAB  2007   in [place] 5 CM   GASTRIC BYPASS  06/2006   HYSTEROSCOPY W/D&C N/A 09/12/2014   Procedure: DILATATION AND CURETTAGE /HYSTEROSCOPY;  Surgeon: Jonnie Kind, MD;  Location: [facility];  Service: Gynecology;  Laterality: N/A;   KYPHOPLASTY N/A 08/02/2018   Procedure: KYPHOPLASTY T12;  Surgeon: Melina Schools, MD;  Location: [facility];  Service: Orthopedics;  Laterality: N/A;  60 mins   LAPAROSCOPIC TUBAL LIGATION Bilateral 03/24/2017   Procedure: LAPAROSCOPIC TUBAL LIGATION ([place] Rings);  Surgeon: Jonnie Kind, MD;  Location: [facility];  Service: Gynecology;  Laterality: Bilateral;   REPAIR VAGINAL CUFF N/A 07/30/2014   Procedure: REPAIR VAGINAL CUFF;  Surgeon: Mora Bellman, MD;  Location: [facility];  Service:  Gynecology;  Laterality: N/A;   SAVORY DILATION  06/20/2012   Dr. Barnie Alderman gastritis/Ulcer in the mid jejunum. Empiric dilation.    SURAL NERVE BX Left 02/25/2016   Procedure: LEFT SURAL NERVE BIOPSY;  Surgeon: Jovita Gamma, MD;  Location: [facility];  Service: Neurosurgery;  Laterality: Left;  Left sural nerve biopsy   TONSILLECTOMY     TONSILLECTOMY AND ADENOIDECTOMY     UPPER GASTROINTESTINAL ENDOSCOPY  JULY 2011 NAUSEA-D125,V6, PH 25   Bx; GASTRITIS, POUCH-5 CM LONG   WISDOM TOOTH EXTRACTION       OB History    Gravida  3   Para  2   Term  1   Preterm  1   AB  1   Living  2     SAB  1  TAB      Ectopic      Multiple      Live Births  2            Home Medications    Prior to Admission medications   Medication Sig Start Date End Date Taking? Authorizing Provider  amphetamine-dextroamphetamine (ADDERALL XR) 30 MG 24 hr capsule TAKE (1) CAPSULE BY MOUTH EVERY DAY. 07/25/19   Cloria Spring, MD  amphetamine-dextroamphetamine (ADDERALL XR) 30 MG 24 hr capsule Take 1 capsule (30 mg total) by mouth daily. 07/25/19   Cloria Spring, MD  Capsaicin-Menthol-Methyl Sal (CAPSAICIN-METHYL SAL-MENTHOL) 0.025-1-12 % CREA Apply 1 application topically 4 (four) times daily as needed (as needed for muscle spasms and low back pain). Patient not taking: Reported on 08/21/2019 07/14/19   Noe Gens, PA-C  carbamazepine (TEGRETOL) 200 MG tablet Take 2 tablets (400 mg total) by mouth at bedtime. 07/25/19   Cloria Spring, MD  clonazePAM (KLONOPIN) 0.5 MG tablet TAKE 1 TABLET BY MOUTH THREE TIMES A DAY 08/14/19   Cloria Spring, MD  cyclobenzaprine (FLEXERIL) 5 MG tablet Take 1-2 tablets (5-10 mg total) by mouth 2 (two) times daily as needed for muscle spasms. Patient not taking: Reported on 08/21/2019 07/14/19   Noe Gens, PA-C  DULoxetine (CYMBALTA) 60 MG capsule Take 1 capsule (60 mg total) by mouth 2 (two) times daily. 07/25/19   Cloria Spring, MD  ondansetron  (ZOFRAN ODT) 8 MG disintegrating tablet Take 1 tablet (8 mg total) by mouth every 8 (eight) hours as needed for nausea or vomiting. 08/21/19   Kathyrn Drown, MD  pantoprazole (PROTONIX) 40 MG tablet Take 1 tablet (40 mg total) by mouth 2 (two) times daily before a meal. 04/27/19   Rehman, Mechele Dawley, MD  PROAIR HFA 108 (90 Base) MCG/ACT inhaler INHALE 2 PUFFS INTO THE LUNGS EVERY 6 HOURS AS NEEDED. Patient taking differently: Inhale 2 puffs into the lungs every 6 (six) hours as needed for wheezing or shortness of breath.  06/14/17   Kathyrn Drown, MD  promethazine (PHENERGAN) 25 MG tablet TAKE ONE TABLET BY MOUTH EVERY 6 HOURS AS NEEDED. Patient taking differently: Take 25 mg by mouth every 6 (six) hours as needed for nausea or vomiting.  07/03/18   Estill Dooms, NP  topiramate (TOPAMAX) 50 MG tablet TAKE ONE IN THE AM AND 2 AT NIGHT 08/06/19   Cloria Spring, MD  valACYclovir (VALTREX) 1000 MG tablet Take 1 bid for 10 days then Take  1 daily for suppression 05/03/19   Estill Dooms, NP  zolpidem (AMBIEN) 10 MG tablet Take 1 tablet (10 mg total) by mouth at bedtime. 07/25/19 08/24/19  Cloria Spring, MD    Family History Family History  Problem Relation Age of Onset   Hemochromatosis Maternal Grandmother    Migraines Maternal Grandmother    Cancer Maternal Grandmother    Breast cancer Maternal Grandmother    Hypertension Father    Diabetes Father    Coronary artery disease Father    Migraines Paternal Grandmother    Breast cancer Paternal Grandmother    Cancer Mother        breast   Hemochromatosis Mother    Breast cancer Mother    Depression Mother    Anxiety disorder Mother    Coronary artery disease Paternal Grandfather    Anxiety disorder Brother    Bipolar disorder Brother    Healthy Daughter    Healthy  Son     Social History Social History   Tobacco Use   Smoking status: Current Every Day Smoker    Packs/day: 0.25    Years: 6.00    Pack  years: 1.50    Types: E-cigarettes    Start date: 05/2018   Smokeless tobacco: Never Used  Substance Use Topics   Alcohol use: Yes    Alcohol/week: 0.0 standard drinks    Comment: 1-2 beers a couple times a month    Drug use: Not Currently    Types: Marijuana    Comment: Previously opioid addiction went through rehab     Allergies   Gabapentin, Metoclopramide hcl, Tramadol, Ativan [lorazepam], Latex, Lyrica [pregabalin], Trazodone and nefazodone, Zofran [ondansetron], Sulfa antibiotics, Butrans [buprenorphine], Nucynta [tapentadol], Propofol, Tape, and Toradol [ketorolac tromethamine]   Review of Systems Review of Systems  Constitutional: Negative for chills and fever.  Respiratory: Positive for cough and shortness of breath.   Cardiovascular: Positive for chest pain. Negative for leg swelling.  Gastrointestinal: Positive for abdominal pain, constipation, nausea and vomiting.  Genitourinary: Positive for frequency and urgency. Negative for dysuria and hematuria.  All other systems reviewed and are negative.    Physical Exam Updated Vital Signs BP (!) 129/99 (BP Location: Right Arm)    Pulse 78    Temp 98 F (36.7 C) (Axillary)    Resp (!) 21    SpO2 100%   Physical Exam Vitals signs and nursing note reviewed.  Constitutional:      General: She is not in acute distress.    Appearance: She is well-developed.     Comments: Appears uncomfortable and anxious  HENT:     Head: Normocephalic and atraumatic.  Eyes:     General:        Right eye: No discharge.        Left eye: No discharge.     Conjunctiva/sclera: Conjunctivae normal.  Neck:     Vascular: No JVD.     Trachea: No tracheal deviation.  Cardiovascular:     Rate and Rhythm: Regular rhythm. Tachycardia present.     Pulses: Normal pulses.     Comments: 2+ radial and DP/PT pulses bilaterally, Homans sign absent bilaterally, no lower extremity edema, no palpable cords, compartments are soft  Pulmonary:      Effort: Tachypnea present.     Comments: Mild tachypnea, diffuse expiratory wheezes.  Speaking in full sentences without difficulty but chooses to speak in a soft voice.  Chest:     Chest wall: Tenderness present.     Comments:  Left anterior and lateral chest wall tender to palpation with no deformity, crepitus, ecchymosis, or flail segment.  Abdominal:     General: Bowel sounds are normal. There is no distension.     Palpations: Abdomen is soft.     Tenderness: There is abdominal tenderness.     Comments: Generalized tenderness to palpation, worse along the left side of the abdomen.  No guarding or rebound.  Musculoskeletal:     Right lower leg: She exhibits no tenderness. No edema.     Left lower leg: She exhibits no tenderness. No edema.  Skin:    General: Skin is warm and dry.     Findings: No erythema.  Neurological:     Mental Status: She is alert.  Psychiatric:        Behavior: Behavior normal.      ED Treatments / Results  Labs (all labs ordered are listed, but only abnormal  results are displayed) Labs Reviewed  BASIC METABOLIC PANEL - Abnormal; Notable for the following components:      Result Value   CO2 20 (*)    All other components within normal limits  CBC  T4, FREE  LIPASE, BLOOD  D-DIMER, QUANTITATIVE (NOT AT [facility])  TSH  T3  URINALYSIS, ROUTINE W REFLEX MICROSCOPIC  I-STAT BETA HCG BLOOD, ED ([facility], [facility], [facility] ONLY)  TROPONIN I (HIGH SENSITIVITY)  TROPONIN I (HIGH SENSITIVITY)    EKG EKG Interpretation  Date/Time:  Saturday August 25 2019 18:07:51 EDT Ventricular Rate:  125 PR Interval:    QRS Duration: 78 QT Interval:  404 QTC Calculation: 583 R Axis:   56 Text Interpretation:  Sinus tachycardia Otherwise normal ECG Confirmed by Lajean Saver [phone]) on 08/25/2019 8:18:57 PM   Radiology Dg Chest 2 View  Result Date: 08/25/2019 CLINICAL DATA:  Shortness of breath EXAM: CHEST - 2 VIEW COMPARISON:  08/23/2019 FINDINGS: The heart size and  mediastinal contours are within normal limits. Both lungs are clear. The visualized skeletal structures are unremarkable. IMPRESSION: No acute abnormality of the lungs. Electronically Signed   By: Eddie Candle M.D.   On: 08/25/2019 18:47   Ct Abdomen Pelvis W Contrast  Result Date: 08/25/2019 CLINICAL DATA:  Abdominal pain. Hernia. EXAM: CT ABDOMEN AND PELVIS WITH CONTRAST TECHNIQUE: Multidetector CT imaging of the abdomen and pelvis was performed using the standard protocol following bolus administration of intravenous contrast. CONTRAST:  159m OMNIPAQUE IOHEXOL 300 MG/ML  SOLN COMPARISON:  December 18, 2017. FINDINGS: Lower chest: The lung bases are clear. The heart size is normal. Hepatobiliary: The liver is normal. Status post cholecystectomy.There is no biliary ductal dilation. Pancreas: Normal contours without ductal dilatation. No peripancreatic fluid collection. Spleen: No splenic laceration or hematoma. Adrenals/Urinary Tract: --Adrenal glands: No adrenal hemorrhage. --Right kidney/ureter: No hydronephrosis or perinephric hematoma. --Left kidney/ureter: No hydronephrosis or perinephric hematoma. --Urinary bladder: Unremarkable. Stomach/Bowel: --Stomach/Duodenum: The patient is status post prior gastric bypass. --Small bowel: No dilatation or inflammation. --Colon: No focal abnormality. --Appendix: Normal. Vascular/Lymphatic: Normal course and caliber of the major abdominal vessels. --No retroperitoneal lymphadenopathy. --there are mildly prominent mesenteric lymph nodes. --No pelvic or inguinal lymphadenopathy. Reproductive: Unremarkable Other: No ascites or free air. The abdominal wall is normal. Musculoskeletal. The patient is status post prior vertebral augmentation of the T12 vertebral body. IMPRESSION: No acute abdominopelvic abnormality. Electronically Signed   By: CConstance HolsterM.D.   On: 08/25/2019 20:57    Procedures Procedures (including critical care time)  Medications Ordered in  ED Medications  LORazepam (ATIVAN) injection 1 mg (1 mg Intravenous Refused 08/25/19 2051)  acetaminophen (TYLENOL) tablet 1,000 mg (has no administration in time range)  sodium chloride 0.9 % bolus 1,000 mL (1,000 mLs Intravenous New Bag/Given 08/25/19 1943)  methylPREDNISolone sodium succinate (SOLU-MEDROL) 125 mg/2 mL injection 125 mg (125 mg Intravenous Given 08/25/19 2053)  iohexol (OMNIPAQUE) 300 MG/ML solution 100 mL (100 mLs Intravenous Contrast Given 08/25/19 2034)     Initial Impression / Assessment and Plan / ED Course  I have reviewed the triage vital signs and the nursing notes.  Pertinent labs & imaging results that were available during my care of the patient were reviewed by me and considered in my medical decision making (see chart for details).        Evaluation with benign work-up and also has been seen by cardiology on an outpatient basis.  She is also complaining of abdominal pain, nausea, vomiting, and constipation.  On initial assessment she is tachycardic but vital signs otherwise stable and she is uncomfortable but nontoxic in appearance.  Pain is reproducible on palpation so there may be a musculoskeletal component.  EKG shows sinus tachycardia, no acute ischemic abnormalities.  Initial troponin is negative.  A low suspicion of ACS/MI and her symptoms are quite atypical, unlikely to be cardiac in etiology.  Her d-dimer is negative and I have a low suspicion of PE in the absence of risk factors, and given her symptoms have been ongoing for a few weeks.  She is not hypoxic.  Chest x-ray shows no acute cardiopulmonary abnormalities with no edema or consolidation.  No pneumothorax.  Remainder of lab work reviewed by me shows no leukocytosis, no anemia, no metabolic derangements.  Her bicarb is a little low which could be consistent with hyperventilation but overall nonspecific finding.  Doubt dissection, cardiac tamponade, esophageal rupture, or pneumothorax.  She does have  generalized wheezing on auscultation of the lungs which may be chronic but she may benefit from steroids in the ED or a course to go home on.  Will obtain CT abdomen pelvis to rule out obstruction given her history of multiple abdominal surgeries.  9:00PM Signed out to provider Dr. Reather Converse.  Pending CT abdomen/pelvis and needs UA and p.o. challenge.  If findings are reassuring she will likely be stable for discharge home. She does have close cardiology follow-up scheduled already.  Final Clinical Impressions(s) / ED Diagnoses   Final diagnoses:  Shortness of breath  Atypical chest pain  Generalized abdominal pain    ED Discharge Orders    None       Debroah Baller 08/25/19 2123    Lajean Saver, MD 08/26/19 [phone]

## 2019-08-25 NOTE — ED Provider Notes (Signed)
Patient CARE signed out to follow-up results with plan for likely outpatient follow-up.  Patient's vital signs in the room are normal, patient did have 1 episode of 70/42 however the previous provider does not feel that is accurate since majority of them have been around [id number] systolic.  Patient CT scan no acute findings.  Patient has outpatient Dr. to call on Monday.  Patient has mild wheezing on exam and has albuterol at home.  Prescription for steroids until she sees primary doctor.  Significant other with patient in the room.  Golda Acre, MD 08/25/19 2151

## 2019-08-25 NOTE — ED Notes (Signed)
Patient transported to CT 

## 2019-08-25 NOTE — ED Notes (Signed)
Pt threatened me that she would make a compliant if I put she left AMA for no reason as I never mentioned it. I simply attempted to greet and go through the discharge process. Pt signed for discharge papers and left with the visitor in the room.

## 2019-08-27 ENCOUNTER — Other Ambulatory Visit: Payer: Self-pay

## 2019-08-27 ENCOUNTER — Encounter (HOSPITAL_COMMUNITY): Payer: Self-pay

## 2019-08-27 ENCOUNTER — Encounter (INDEPENDENT_AMBULATORY_CARE_PROVIDER_SITE_OTHER): Payer: Self-pay | Admitting: Nurse Practitioner

## 2019-08-27 ENCOUNTER — Inpatient Hospital Stay (HOSPITAL_COMMUNITY)
Admission: EM | Admit: 2019-08-27 | Discharge: 2019-08-31 | DRG: 381 | Disposition: A | Discharge status: Home / Self Care | Payer: Medicaid Other | Source: Ambulatory Visit | Attending: Internal Medicine | Admitting: Internal Medicine

## 2019-08-27 ENCOUNTER — Ambulatory Visit (HOSPITAL_COMMUNITY)
Admission: RE | Admit: 2019-08-27 | Discharge: 2019-08-27 | Disposition: A | Discharge status: Home / Self Care | Payer: Medicaid Other | Source: Ambulatory Visit | Attending: Cardiovascular Disease | Admitting: Cardiovascular Disease

## 2019-08-27 ENCOUNTER — Ambulatory Visit (INDEPENDENT_AMBULATORY_CARE_PROVIDER_SITE_OTHER): Payer: Medicaid Other | Admitting: Nurse Practitioner

## 2019-08-27 ENCOUNTER — Emergency Department (HOSPITAL_COMMUNITY): Payer: Medicaid Other

## 2019-08-27 ENCOUNTER — Other Ambulatory Visit (HOSPITAL_COMMUNITY): Payer: Medicaid Other

## 2019-08-27 VITALS — BP 135/90 | HR 125 | Temp 98.2°F | Ht 66.0 in | Wt 200.1 lb

## 2019-08-27 DIAGNOSIS — R748 Abnormal levels of other serum enzymes: Secondary | ICD-10-CM | POA: Diagnosis present

## 2019-08-27 DIAGNOSIS — Z20828 Contact with and (suspected) exposure to other viral communicable diseases: Secondary | ICD-10-CM | POA: Diagnosis not present

## 2019-08-27 DIAGNOSIS — F445 Conversion disorder with seizures or convulsions: Secondary | ICD-10-CM | POA: Diagnosis present

## 2019-08-27 DIAGNOSIS — E860 Dehydration: Secondary | ICD-10-CM | POA: Diagnosis present

## 2019-08-27 DIAGNOSIS — M797 Fibromyalgia: Secondary | ICD-10-CM | POA: Diagnosis present

## 2019-08-27 DIAGNOSIS — Z8249 Family history of ischemic heart disease and other diseases of the circulatory system: Secondary | ICD-10-CM

## 2019-08-27 DIAGNOSIS — K219 Gastro-esophageal reflux disease without esophagitis: Secondary | ICD-10-CM

## 2019-08-27 DIAGNOSIS — R945 Abnormal results of liver function studies: Secondary | ICD-10-CM | POA: Diagnosis not present

## 2019-08-27 DIAGNOSIS — Z803 Family history of malignant neoplasm of breast: Secondary | ICD-10-CM

## 2019-08-27 DIAGNOSIS — Z87891 Personal history of nicotine dependence: Secondary | ICD-10-CM

## 2019-08-27 DIAGNOSIS — Z9049 Acquired absence of other specified parts of digestive tract: Secondary | ICD-10-CM

## 2019-08-27 DIAGNOSIS — R9431 Abnormal electrocardiogram [ECG] [EKG]: Secondary | ICD-10-CM | POA: Diagnosis present

## 2019-08-27 DIAGNOSIS — Z91040 Latex allergy status: Secondary | ICD-10-CM

## 2019-08-27 DIAGNOSIS — Z832 Family history of diseases of the blood and blood-forming organs and certain disorders involving the immune mechanism: Secondary | ICD-10-CM

## 2019-08-27 DIAGNOSIS — F909 Attention-deficit hyperactivity disorder, unspecified type: Secondary | ICD-10-CM | POA: Diagnosis present

## 2019-08-27 DIAGNOSIS — R110 Nausea: Secondary | ICD-10-CM | POA: Diagnosis not present

## 2019-08-27 DIAGNOSIS — F1210 Cannabis abuse, uncomplicated: Secondary | ICD-10-CM

## 2019-08-27 DIAGNOSIS — R52 Pain, unspecified: Secondary | ICD-10-CM

## 2019-08-27 DIAGNOSIS — K589 Irritable bowel syndrome without diarrhea: Secondary | ICD-10-CM | POA: Diagnosis present

## 2019-08-27 DIAGNOSIS — F1121 Opioid dependence, in remission: Secondary | ICD-10-CM | POA: Diagnosis present

## 2019-08-27 DIAGNOSIS — R740 Nonspecific elevation of levels of transaminase and lactic acid dehydrogenase [LDH]: Secondary | ICD-10-CM | POA: Diagnosis present

## 2019-08-27 DIAGNOSIS — Z8711 Personal history of peptic ulcer disease: Secondary | ICD-10-CM

## 2019-08-27 DIAGNOSIS — F410 Panic disorder [episodic paroxysmal anxiety] without agoraphobia: Secondary | ICD-10-CM | POA: Diagnosis present

## 2019-08-27 DIAGNOSIS — F419 Anxiety disorder, unspecified: Secondary | ICD-10-CM | POA: Diagnosis present

## 2019-08-27 DIAGNOSIS — Z79899 Other long term (current) drug therapy: Secondary | ICD-10-CM

## 2019-08-27 DIAGNOSIS — Z885 Allergy status to narcotic agent status: Secondary | ICD-10-CM

## 2019-08-27 DIAGNOSIS — K8309 Other cholangitis: Secondary | ICD-10-CM | POA: Diagnosis present

## 2019-08-27 DIAGNOSIS — R1010 Upper abdominal pain, unspecified: Secondary | ICD-10-CM

## 2019-08-27 DIAGNOSIS — M549 Dorsalgia, unspecified: Secondary | ICD-10-CM | POA: Diagnosis present

## 2019-08-27 DIAGNOSIS — R0600 Dyspnea, unspecified: Secondary | ICD-10-CM

## 2019-08-27 DIAGNOSIS — J9801 Acute bronchospasm: Secondary | ICD-10-CM | POA: Diagnosis not present

## 2019-08-27 DIAGNOSIS — Z882 Allergy status to sulfonamides status: Secondary | ICD-10-CM

## 2019-08-27 DIAGNOSIS — G8929 Other chronic pain: Secondary | ICD-10-CM | POA: Diagnosis present

## 2019-08-27 DIAGNOSIS — F988 Other specified behavioral and emotional disorders with onset usually occurring in childhood and adolescence: Secondary | ICD-10-CM | POA: Diagnosis present

## 2019-08-27 DIAGNOSIS — G43809 Other migraine, not intractable, without status migrainosus: Secondary | ICD-10-CM | POA: Diagnosis present

## 2019-08-27 DIAGNOSIS — R55 Syncope and collapse: Secondary | ICD-10-CM | POA: Diagnosis present

## 2019-08-27 DIAGNOSIS — Z9884 Bariatric surgery status: Secondary | ICD-10-CM

## 2019-08-27 DIAGNOSIS — Z888 Allergy status to other drugs, medicaments and biological substances status: Secondary | ICD-10-CM

## 2019-08-27 DIAGNOSIS — K287 Chronic gastrojejunal ulcer without hemorrhage or perforation: Principal | ICD-10-CM | POA: Diagnosis present

## 2019-08-27 DIAGNOSIS — Z833 Family history of diabetes mellitus: Secondary | ICD-10-CM

## 2019-08-27 DIAGNOSIS — F329 Major depressive disorder, single episode, unspecified: Secondary | ICD-10-CM | POA: Diagnosis present

## 2019-08-27 DIAGNOSIS — F900 Attention-deficit hyperactivity disorder, predominantly inattentive type: Secondary | ICD-10-CM | POA: Diagnosis present

## 2019-08-27 DIAGNOSIS — G609 Hereditary and idiopathic neuropathy, unspecified: Secondary | ICD-10-CM | POA: Diagnosis present

## 2019-08-27 DIAGNOSIS — E559 Vitamin D deficiency, unspecified: Secondary | ICD-10-CM | POA: Diagnosis present

## 2019-08-27 DIAGNOSIS — R109 Unspecified abdominal pain: Secondary | ICD-10-CM | POA: Diagnosis not present

## 2019-08-27 DIAGNOSIS — R7401 Elevation of levels of liver transaminase levels: Secondary | ICD-10-CM | POA: Diagnosis present

## 2019-08-27 LAB — CBC
HCT: 39.3 % (ref 36.0–46.0)
Hemoglobin: 13.5 g/dL (ref 12.0–15.0)
MCH: 32.1 pg (ref 26.0–34.0)
MCHC: 34.4 g/dL (ref 30.0–36.0)
MCV: 93.3 fL (ref 80.0–100.0)
Platelets: 224 10*3/uL (ref 150–400)
RBC: 4.21 MIL/uL (ref 3.87–5.11)
RDW: 13.6 % (ref 11.5–15.5)
WBC: 8 10*3/uL (ref 4.0–10.5)
nRBC: 0 % (ref 0.0–0.2)

## 2019-08-27 LAB — URINALYSIS, ROUTINE W REFLEX MICROSCOPIC
Bilirubin Urine: NEGATIVE
Glucose, UA: NEGATIVE mg/dL
Hgb urine dipstick: NEGATIVE
Ketones, ur: NEGATIVE mg/dL
Nitrite: NEGATIVE
Protein, ur: NEGATIVE mg/dL
Specific Gravity, Urine: 1.02 (ref 1.005–1.030)
pH: 5 (ref 5.0–8.0)

## 2019-08-27 LAB — COMPREHENSIVE METABOLIC PANEL
ALT: 368 U/L — ABNORMAL HIGH (ref 0–44)
AST: 720 U/L — ABNORMAL HIGH (ref 15–41)
Albumin: 4.2 g/dL (ref 3.5–5.0)
Alkaline Phosphatase: 88 U/L (ref 38–126)
Anion gap: 9 (ref 5–15)
BUN: 19 mg/dL (ref 6–20)
CO2: 16 mmol/L — ABNORMAL LOW (ref 22–32)
Calcium: 8.8 mg/dL — ABNORMAL LOW (ref 8.9–10.3)
Chloride: 113 mmol/L — ABNORMAL HIGH (ref 98–111)
Creatinine, Ser: 0.65 mg/dL (ref 0.44–1.00)
GFR calc Af Amer: >60 mL/min (ref >60)
GFR calc non Af Amer: >60 mL/min (ref >60)
Glucose, Bld: 86 mg/dL (ref 70–99)
Potassium: 4.1 mmol/L (ref 3.5–5.1)
Sodium: 138 mmol/L (ref 135–145)
Total Bilirubin: 0.8 mg/dL (ref 0.3–1.2)
Total Protein: 7.1 g/dL (ref 6.5–8.1)

## 2019-08-27 LAB — ETHANOL: Alcohol, Ethyl (B): <10 mg/dL (ref <10)

## 2019-08-27 LAB — MAGNESIUM: Magnesium: 2.1 mg/dL (ref 1.7–2.4)

## 2019-08-27 LAB — T3: T3, Total: 87 ng/dL (ref 71–180)

## 2019-08-27 LAB — LIPASE, BLOOD: Lipase: 31 U/L (ref 11–51)

## 2019-08-27 MED ORDER — SODIUM CHLORIDE 0.9 % IV BOLUS
1000.0000 mL | Freq: Once | INTRAVENOUS | Status: AC
  Administered 2019-08-27: 1000 mL via INTRAVENOUS

## 2019-08-27 MED ORDER — HYDROMORPHONE HCL 1 MG/ML IJ SOLN
1.0000 mg | Freq: Once | INTRAMUSCULAR | Status: AC
  Administered 2019-08-27: 1 mg via INTRAVENOUS
  Filled 2019-08-27: qty 1

## 2019-08-27 MED ORDER — TOPIRAMATE 25 MG PO TABS
50.0000 mg | ORAL_TABLET | Freq: Every day | ORAL | Status: DC
  Administered 2019-08-28 – 2019-08-31 (×4): 50 mg via ORAL
  Filled 2019-08-27 (×4): qty 2

## 2019-08-27 MED ORDER — DULOXETINE HCL 60 MG PO CPEP
60.0000 mg | ORAL_CAPSULE | Freq: Two times a day (BID) | ORAL | Status: DC
  Administered 2019-08-27 – 2019-08-31 (×8): 60 mg via ORAL
  Filled 2019-08-27 (×8): qty 1

## 2019-08-27 MED ORDER — TOPIRAMATE 100 MG PO TABS
100.0000 mg | ORAL_TABLET | Freq: Every day | ORAL | Status: DC
  Administered 2019-08-27 – 2019-08-30 (×4): 100 mg via ORAL
  Filled 2019-08-27 (×4): qty 1

## 2019-08-27 MED ORDER — PANTOPRAZOLE SODIUM 40 MG IV SOLR
40.0000 mg | Freq: Once | INTRAVENOUS | Status: AC
  Administered 2019-08-27: 40 mg via INTRAVENOUS
  Filled 2019-08-27: qty 40

## 2019-08-27 MED ORDER — TOPIRAMATE 25 MG PO TABS: 50.0000 mg | ORAL_TABLET | ORAL | Status: DC

## 2019-08-27 MED ORDER — ACETAMINOPHEN 325 MG PO TABS: 650.0000 mg | ORAL_TABLET | Freq: Four times a day (QID) | ORAL | Status: DC | PRN

## 2019-08-27 MED ORDER — MORPHINE SULFATE (PF) 2 MG/ML IV SOLN: 2.0000 mg | INTRAVENOUS | Status: DC | PRN

## 2019-08-27 MED ORDER — ACETAMINOPHEN 650 MG RE SUPP: 650.0000 mg | Freq: Four times a day (QID) | RECTAL | Status: DC | PRN

## 2019-08-27 MED ORDER — PROMETHAZINE HCL 12.5 MG PO TABS
12.5000 mg | ORAL_TABLET | Freq: Four times a day (QID) | ORAL | Status: DC | PRN
  Administered 2019-08-27 – 2019-08-31 (×5): 12.5 mg via ORAL
  Filled 2019-08-27 (×5): qty 1

## 2019-08-27 MED ORDER — PANTOPRAZOLE SODIUM 40 MG IV SOLR
40.0000 mg | Freq: Two times a day (BID) | INTRAVENOUS | Status: DC
  Administered 2019-08-28 – 2019-08-31 (×7): 40 mg via INTRAVENOUS
  Filled 2019-08-27 (×7): qty 40

## 2019-08-27 MED ORDER — HYDROMORPHONE HCL 1 MG/ML IJ SOLN
0.5000 mg | INTRAMUSCULAR | Status: DC | PRN
  Administered 2019-08-27 – 2019-08-31 (×19): 0.5 mg via INTRAVENOUS
  Filled 2019-08-27 (×20): qty 0.5

## 2019-08-27 MED ORDER — DEXTROSE IN LACTATED RINGERS 5 % IV SOLN
INTRAVENOUS | Status: AC
  Administered 2019-08-27 – 2019-08-28 (×2): via INTRAVENOUS

## 2019-08-27 MED ORDER — ZOLPIDEM TARTRATE 5 MG PO TABS
5.0000 mg | ORAL_TABLET | Freq: Every evening | ORAL | Status: DC | PRN
  Administered 2019-08-27 – 2019-08-31 (×5): 5 mg via ORAL
  Filled 2019-08-27 (×7): qty 1

## 2019-08-27 MED ORDER — POLYETHYLENE GLYCOL 3350 17 G PO PACK
17.0000 g | PACK | Freq: Every day | ORAL | Status: DC | PRN
  Administered 2019-08-30: 17 g via ORAL
  Filled 2019-08-27: qty 1

## 2019-08-27 MED ORDER — SODIUM CHLORIDE 0.9% FLUSH
3.0000 mL | Freq: Once | INTRAVENOUS | Status: AC
  Administered 2019-08-27: 3 mL via INTRAVENOUS

## 2019-08-27 NOTE — ED Provider Notes (Signed)
[facility] EMERGENCY DEPARTMENT Provider Note   CSN: [id number] Arrival date & time: 08/27/19  1216     History   Chief Complaint Chief Complaint  Patient presents with  . Abdominal Pain    HPI Penny [name] is a 33 y.o. female.     Patient complains of abdominal pain and vomiting.  She has had 3 visits to the emergency department.  She went to her GI doctor today and they sent her here for an ultrasound and admission to the hospital.  The history is provided by the patient. No language interpreter was used.  Abdominal Pain Pain location:  Generalized Pain quality: aching   Pain radiates to:  Does not radiate Pain severity:  Moderate Onset quality:  Sudden Timing:  Constant Progression:  Worsening Chronicity:  Recurrent Relieved by:  Nothing Worsened by:  Nothing Associated symptoms: no chest pain, no cough, no diarrhea, no fatigue and no hematuria     Past Medical History:  Diagnosis Date  . ADD (attention deficit disorder)   . Anemia 2011   2o to GASTRIC BYPASS  . Anginal pain (HCC)   . Anxiety   . Blood transfusion without reported diagnosis   . Chronic daily headache   . Depression   . Depression   . Dysmenorrhea 09/18/2013  . Dysrhythmia   . Elevated liver enzymes JUL 2011ALK PHOS 111-127 AST  143-267 ALT  213-321T BILI 0.6  ALB  3.7-4.06 Jun 2011 ALK PHOS 118 AST 24 ALT 42 T BILI 0.4 ALB 3.9  . Encounter for drug rehabilitation    at behavioral health for opioid addiction about 4 years ago  . Family history of adverse reaction to anesthesia    'dad had to be kept on pump for breathing for morphine'  . Fatty liver   . Fibroid 01/18/2017  . Fibromyalgia   . Gastric bypass status for obesity   . Gastritis JULY 2011  . Heart murmur   . 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 History of Holter monitoring   . Hx of opioid abuse (HCC)    for about 4 years, about 4 years ago  . Interstitial cystitis   . Iron deficiency  anemia 07/23/2010  . Irritable bowel syndrome 2012 DIARRHEA   JUN 2012 TTG IgA 14.9  . IUD FEB 2010  . Lupus (HCC)   . Menorrhagia 07/18/2013  . Migraines   . Obesity (BMI 30-39.9) 2011 228 LBS BMI 36.8  . Ovarian cyst   . Patient desires pregnancy 09/18/2013  . Polyneuropathy   . PONV (postoperative nausea and vomiting) seizure post-operatively   seizure following ankle surgery  . Potassium (K) deficiency   . Pregnant 12/25/2013  . Psychiatric pseudoseizure   . RLQ abdominal pain 07/18/2013  . Sciatica of left side 11/14/2014  . 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 Stress 09/18/2013    Patient Active Problem List   Diagnosis Date Noted  . Severe anxiety 08/21/2019  . Hematemesis with nausea 04/23/2019  . Absolute anemia 04/23/2019  . Urge incontinence 10/26/2018  . S/P kyphoplasty 08/02/2018  . Herpes genitalis in women 12/19/2017  . Abnormal urine odor 03/30/2017  . Urinary tract infection with hematuria 03/30/2017  . Screening for genitourinary condition 03/30/2017  . Pain 03/30/2017  . Burning with urination 03/30/2017  . Family history of breast cancer in mother,uncertain BR CA status 02/28/2017  . Fibroid 01/18/2017  . Chronic pain syndrome 12/05/2016  . Folate deficiency 12/04/2016  .   Nausea vomiting and diarrhea 12/04/2016  . Severe anemia 12/04/2016  . Symptomatic anemia 12/03/2016  . Complaints of leg weakness   . Paresthesia   . Left-sided weakness   . Uncontrolled pain 01/24/2016  . Malnutrition of moderate degree 01/24/2016  . Neuropathy 01/23/2016  . Inability to walk 01/23/2016  . Paresthesias   . Bilateral leg numbness 11/26/2015  . Major depressive disorder, recurrent episode, 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HCC) 05/10/2015  . Post partum depression 05/09/2015  . Anastomotic ulcer   . Dysphagia   . Hematemesis 04/07/2015  . Intractable vomiting with nausea 04/07/2015  . Elevated liver enzymes   . Epigastric pain   . Pseudoseizure (HCC)  02/22/2015  . Seizure-like activity (HCC) 02/22/2015  . Fibromyalgia 02/18/2015  . Vulvar fissure 02/18/2015  . Clinical depression 02/01/2015  . Current smoker 01/29/2015  . Current tobacco use 01/29/2015  . 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 Leg weakness, bilateral 01/02/2015  . Gait disorder 01/02/2015  . Other symptoms and signs involving the musculoskeletal system 01/02/2015  . Abnormal gait 01/02/2015  . Cervicalgia 12/18/2014  . Sciatica of left side 11/14/2014  . Neuralgia neuritis, sciatic nerve 11/14/2014  . 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 Encounter for sterilization 09/09/2014  . Abdominal pain, acute, right lower quadrant 08/22/2014  . Headache, migraine 08/19/2014  . Seizure (HCC) 08/18/2014  . Encephalopathy 08/13/2014  . Headache 08/13/2014  . Altered mental status 08/13/2014  . Right leg numbness 07/31/2014  . Disturbance of skin sensation 07/31/2014  . Hypertension in pregnancy, transient 07/08/2014  . Previous gastric bypass affecting pregnancy, antepartum 05/22/2014  . 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 ASD (atrial septal defect), ostium secundum 05/17/2014  . Depression complicating pregnancy in second trimester, antepartum 05/09/2014  . Antepartum mental disorder in pregnancy 05/09/2014  . Rapid palpitations 02/18/2014  . H/O maternal third degree perineal laceration, currently pregnant 02/18/2014  . High-risk pregnancy 02/18/2014  . Supervision of pregnancy with other poor reproductive or obstetric history, unspecified trimester 02/18/2014  . Restless legs 01/16/2014  . Restless leg 01/16/2014  . Chronic interstitial cystitis 10/23/2013  . Menorrhagia 07/18/2013  . Excessive and frequent menstruation 07/18/2013  . h/o Opiate addiction 03/11/2012    Class: Acute  . History of migraine headaches 03/10/2012    Class: Acute  . Depression with anxiety 03/10/2012    Class: Chronic  . Panic disorder without  agoraphobia with moderate panic attacks 03/10/2012    Class: Chronic  . 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 Panic disorder without agoraphobia 03/10/2012  . H/O disease 03/10/2012  . Dysthymia 03/10/2012  . Pelvic congestion syndrome 10/13/2011  . Coitalgia 10/13/2011  . Chronic migraine without aura 10/13/2011  . Unspecified dyspareunia 10/13/2011  . IBS (irritable bowel syndrome) 08/25/2011  . Diarrhea 05/27/2011  . OBESITY, UNSPECIFIED 09/17/2010  . Transaminitis 09/17/2010  . IDA (iron deficiency anemia) 07/23/2010    Past Surgical History:  Procedure Laterality Date  . BIOPSY  04/10/2015   Procedure: BIOPSY;  Surgeon: Robert M Rourk, MD;  Location: [facility];  Service: Endoscopy;;  . cath self every nite     for sodium bicarb injection (discontinued 2013)  . CHOLECYSTECTOMY  2005   biliary dyskinesia  . COLONOSCOPY  JUN 2012 ABD PN/DIARRHEA WITH PROPOFOL   NL COLON  . DILATION AND CURETTAGE OF UTERUS    . DILITATION & CURRETTAGE/HYSTROSCOPY WITH NOVASURE ABLATION N/A 03/24/2017   Procedure: DILATATION &   CURETTAGE/HYSTEROSCOPY WITH NOVASURE ENDOMETRIAL ABLATION;  Surgeon: Jonnie Kind, MD;  Location: [facility];  Service: Gynecology;  Laterality: N/A;  . ESOPHAGEAL DILATION N/A 04/10/2015   Procedure: ESOPHAGEAL DILATION WITH 54FR MALONEY DILATOR;  Surgeon: Daneil Dolin, MD;  Location: [facility];  Service: Endoscopy;  Laterality: N/A;  . ESOPHAGOGASTRODUODENOSCOPY    . ESOPHAGOGASTRODUODENOSCOPY (EGD) WITH PROPOFOL N/A 04/10/2015   Procedure: ESOPHAGOGASTRODUODENOSCOPY (EGD) WITH PROPOFOL;  Surgeon: Daneil Dolin, MD;  Location: [facility];  Service: Endoscopy;  Laterality: N/A;  . ESOPHAGOGASTRODUODENOSCOPY (EGD) WITH PROPOFOL N/A 12/06/2016   Procedure: ESOPHAGOGASTRODUODENOSCOPY (EGD) WITH PROPOFOL;  Surgeon: Danie Binder, MD;  Location: [facility];  Service: Endoscopy;  Laterality: N/A;  . ESOPHAGOGASTRODUODENOSCOPY (EGD) WITH  PROPOFOL N/A 04/27/2019   Procedure: ESOPHAGOGASTRODUODENOSCOPY (EGD) WITH PROPOFOL;  Surgeon: Rogene Houston, MD;  Location: [facility];  Service: Endoscopy;  Laterality: N/A;  730  . GAB  2007   in [place]-POUCH 5 CM  . GASTRIC BYPASS  06/2006  . HYSTEROSCOPY W/D&C N/A 09/12/2014   Procedure: DILATATION AND CURETTAGE /HYSTEROSCOPY;  Surgeon: Jonnie Kind, MD;  Location: [facility];  Service: Gynecology;  Laterality: N/A;  . KYPHOPLASTY N/A 08/02/2018   Procedure: KYPHOPLASTY T12;  Surgeon: Melina Schools, MD;  Location: [facility];  Service: Orthopedics;  Laterality: N/A;  60 mins  . LAPAROSCOPIC TUBAL LIGATION Bilateral 03/24/2017   Procedure: LAPAROSCOPIC TUBAL LIGATION ([place] Rings);  Surgeon: Jonnie Kind, MD;  Location: [facility];  Service: Gynecology;  Laterality: Bilateral;  . REPAIR VAGINAL CUFF N/A 07/30/2014   Procedure: REPAIR VAGINAL CUFF;  Surgeon: Mora Bellman, MD;  Location: [facility];  Service: Gynecology;  Laterality: N/A;  . SAVORY DILATION  06/20/2012   Dr. Barnie Alderman gastritis/Ulcer in the mid jejunum. Empiric dilation.   . SURAL NERVE BX Left 02/25/2016   Procedure: LEFT SURAL NERVE BIOPSY;  Surgeon: Jovita Gamma, MD;  Location: [facility];  Service: Neurosurgery;  Laterality: Left;  Left sural nerve biopsy  . TONSILLECTOMY    . TONSILLECTOMY AND ADENOIDECTOMY    . UPPER GASTROINTESTINAL ENDOSCOPY  JULY 2011 NAUSEA-D125,V6, PH 25   Bx; GASTRITIS, POUCH-5 CM LONG  . WISDOM TOOTH EXTRACTION       OB History    Gravida  3   Para  2   Term  1   Preterm  1   AB  1   Living  2     SAB  1   TAB      Ectopic      Multiple      Live Births  2            Home Medications    Prior to Admission medications   Medication Sig Start Date End Date Taking? Authorizing Provider  amphetamine-dextroamphetamine (ADDERALL XR) 30 MG 24 hr capsule TAKE (1) CAPSULE BY MOUTH EVERY DAY. 07/25/19   Cloria Spring, MD  cyclobenzaprine (FLEXERIL) 5 MG tablet Take  1-2 tablets (5-10 mg total) by mouth 2 (two) times daily as needed for muscle spasms. Patient not taking: Reported on 08/21/2019 07/14/19   Noe Gens, PA-C  DULoxetine (CYMBALTA) 60 MG capsule Take 1 capsule (60 mg total) by mouth 2 (two) times daily. 07/25/19   Cloria Spring, MD  ondansetron (ZOFRAN ODT) 8 MG disintegrating tablet Take 1 tablet (8 mg total) by mouth every 8 (eight) hours as needed for nausea or vomiting. 08/21/19   Kathyrn Drown, MD  pantoprazole (PROTONIX) 40  MG tablet Take 1 tablet (40 mg total) by mouth 2 (two) times daily before a meal. 04/27/19   Rehman, Mechele Dawley, MD  predniSONE (DELTASONE) 20 MG tablet 2 tabs po daily x 3 days Patient not taking: Reported on 08/27/2019 08/25/19   Elnora Morrison, MD  PROAIR HFA 108 [phone] Base) MCG/ACT inhaler INHALE 2 PUFFS INTO THE LUNGS EVERY 6 HOURS AS NEEDED. Patient taking differently: Inhale 2 puffs into the lungs every 6 (six) hours as needed for wheezing or shortness of breath.  06/14/17   Kathyrn Drown, MD  promethazine (PHENERGAN) 25 MG tablet TAKE ONE TABLET BY MOUTH EVERY 6 HOURS AS NEEDED. Patient taking differently: Take 25 mg by mouth every 6 (six) hours as needed for nausea or vomiting.  07/03/18   Estill Dooms, NP  tiZANidine (ZANAFLEX) 4 MG tablet TAKE 1 TABLET BY MOUTH FOUR TIMES A DAY AS NEEDED 06/07/19   [provider]  topiramate (TOPAMAX) 50 MG tablet TAKE ONE IN THE AM AND 2 AT NIGHT 08/06/19   Cloria Spring, MD  valACYclovir (VALTREX) 1000 MG tablet Take 1 bid for 10 days then Take  1 daily for suppression 05/03/19   Estill Dooms, NP  zolpidem (AMBIEN) 10 MG tablet Take 1 tablet (10 mg total) by mouth at bedtime. 07/25/19 08/27/19  Cloria Spring, MD    Family History Family History  Problem Relation Age of Onset  . Hemochromatosis Maternal Grandmother   . Migraines Maternal Grandmother   . Cancer Maternal Grandmother   . Breast cancer Maternal Grandmother   . Hypertension Father   .  Diabetes Father   . Coronary artery disease Father   . Migraines Paternal Grandmother   . Breast cancer Paternal Grandmother   . Cancer Mother        breast  . Hemochromatosis Mother   . Breast cancer Mother   . Depression Mother   . Anxiety disorder Mother   . Coronary artery disease Paternal Grandfather   . Anxiety disorder Brother   . Bipolar disorder Brother   . Healthy Daughter   . Healthy Son     Social History Social History   Tobacco Use  . Smoking status: Current Every Day Smoker    Packs/day: 0.25    Years: 6.00    Pack years: 1.50    Types: E-cigarettes    Start date: 05/2018  . Smokeless tobacco: Never Used  Substance Use Topics  . Alcohol use: Yes    Alcohol/week: 0.0 standard drinks    Comment: 1-2 beers a couple times a month   . Drug use: Not Currently    Types: Marijuana    Comment: Previously opioid addiction went through rehab     Allergies   Gabapentin, Metoclopramide hcl, Tramadol, Ativan [lorazepam], Latex, Lyrica [pregabalin], Trazodone and nefazodone, Zofran [ondansetron], Sulfa antibiotics, Butrans [buprenorphine], Nucynta [tapentadol], Propofol, Tape, and Toradol [ketorolac tromethamine]   Review of Systems Review of Systems  Constitutional: Negative for appetite change and fatigue.  HENT: Negative for congestion, ear discharge and sinus pressure.   Eyes: Negative for discharge.  Respiratory: Negative for cough.   Cardiovascular: Negative for chest pain.  Gastrointestinal: Positive for abdominal pain. Negative for diarrhea.  Genitourinary: Negative for frequency and hematuria.  Musculoskeletal: Negative for back pain.  Skin: Negative for rash.  Neurological: Negative for seizures and headaches.  Psychiatric/Behavioral: Negative for hallucinations.     Physical Exam Updated Vital Signs BP (!) 124/91 (BP Location: Left Arm)  Pulse (!) 121   Temp 97.7 F (36.5 C) (Oral)   Resp 15   Ht 5' 6" (1.676 m)   Wt 90.7 kg   SpO2 100%    BMI 32.28 kg/m   Physical Exam Vitals signs and nursing note reviewed.  Constitutional:      Appearance: She is well-developed.  HENT:     Head: Normocephalic.     Mouth/Throat:     Mouth: Mucous membranes are moist.  Eyes:     General: No scleral icterus.    Conjunctiva/sclera: Conjunctivae normal.  Neck:     Musculoskeletal: Neck supple.     Thyroid: No thyromegaly.  Cardiovascular:     Rate and Rhythm: Normal rate and regular rhythm.     Heart sounds: No murmur. No friction rub. No gallop.   Pulmonary:     Breath sounds: No stridor. No wheezing or rales.  Chest:     Chest wall: No tenderness.  Abdominal:     General: There is no distension.     Tenderness: There is abdominal tenderness. There is no rebound.  Musculoskeletal: Normal range of motion.  Lymphadenopathy:     Cervical: No cervical adenopathy.  Skin:    Findings: No erythema or rash.  Neurological:     Mental Status: She is oriented to person, place, and time.     Motor: No abnormal muscle tone.     Coordination: Coordination normal.  Psychiatric:        Behavior: Behavior normal.      ED Treatments / Results  Labs (all labs ordered are listed, but only abnormal results are displayed) Labs Reviewed  COMPREHENSIVE METABOLIC PANEL - Abnormal; Notable for the following components:      Result Value   Chloride 113 (*)    CO2 16 (*)    Calcium 8.8 (*)    AST 720 (*)    ALT 368 (*)    All other components within normal limits  URINALYSIS, ROUTINE W REFLEX MICROSCOPIC - Abnormal; Notable for the following components:   Color, Urine AMBER (*)    APPearance HAZY (*)    Leukocytes,Ua SMALL (*)    Bacteria, UA FEW (*)    All other components within normal limits  SARS CORONAVIRUS 2 (TAT 6-24 HRS)  LIPASE, BLOOD  CBC    EKG None  Radiology Dg Chest 2 View  Result Date: 08/25/2019 CLINICAL DATA:  Shortness of breath EXAM: CHEST - 2 VIEW COMPARISON:  08/23/2019 FINDINGS: The heart size and  mediastinal contours are within normal limits. Both lungs are clear. The visualized skeletal structures are unremarkable. IMPRESSION: No acute abnormality of the lungs. Electronically Signed   By: Alex  Bibbey M.D.   On: 08/25/2019 18:47   Us Abdomen Complete  Result Date: 08/27/2019 CLINICAL DATA:  Abdominal pain and nausea for 2 weeks. EXAM: ABDOMEN ULTRASOUND COMPLETE COMPARISON:  CT abdomen and pelvis 08/25/2019. FINDINGS: Gallbladder: Removed. Common bile duct: Diameter: 1.1 cm proximally, unchanged. Liver: No focal lesion identified. Within normal limits in parenchymal echogenicity. Portal vein is patent on color Doppler imaging with normal direction of blood flow towards the liver. IVC: No abnormality visualized. Pancreas: Not visualized. Spleen: Size and appearance within normal limits. Right Kidney: Length: Approximately 10.4 cm. Echogenicity within normal limits. No mass or hydronephrosis visualized. Left Kidney: Length: Not visualized. Abdominal aorta: No aneurysm visualized. Other findings: None. IMPRESSION: No acute abnormality or finding to explain the patient's symptoms. The pancreas and left kidney are not seen due   to overlying bowel gas. Status post cholecystectomy Electronically Signed   By: Inge Rise M.D.   On: 08/27/2019 16:18   Ct Abdomen Pelvis W Contrast  Result Date: 08/25/2019 CLINICAL DATA:  Abdominal pain. Hernia. EXAM: CT ABDOMEN AND PELVIS WITH CONTRAST TECHNIQUE: Multidetector CT imaging of the abdomen and pelvis was performed using the standard protocol following bolus administration of intravenous contrast. CONTRAST:  147m OMNIPAQUE IOHEXOL 300 MG/ML  SOLN COMPARISON:  December 18, 2017. FINDINGS: Lower chest: The lung bases are clear. The heart size is normal. Hepatobiliary: The liver is normal. Status post cholecystectomy.There is no biliary ductal dilation. Pancreas: Normal contours without ductal dilatation. No peripancreatic fluid collection. Spleen: No splenic  laceration or hematoma. Adrenals/Urinary Tract: --Adrenal glands: No adrenal hemorrhage. --Right kidney/ureter: No hydronephrosis or perinephric hematoma. --Left kidney/ureter: No hydronephrosis or perinephric hematoma. --Urinary bladder: Unremarkable. Stomach/Bowel: --Stomach/Duodenum: The patient is status post prior gastric bypass. --Small bowel: No dilatation or inflammation. --Colon: No focal abnormality. --Appendix: Normal. Vascular/Lymphatic: Normal course and caliber of the major abdominal vessels. --No retroperitoneal lymphadenopathy. --there are mildly prominent mesenteric lymph nodes. --No pelvic or inguinal lymphadenopathy. Reproductive: Unremarkable Other: No ascites or free air. The abdominal wall is normal. Musculoskeletal. The patient is status post prior vertebral augmentation of the T12 vertebral body. IMPRESSION: No acute abdominopelvic abnormality. Electronically Signed   By: CConstance HolsterM.D.   On: 08/25/2019 20:57    Procedures Procedures (including critical care time)  Medications Ordered in ED Medications  sodium chloride flush (NS) 0.9 % injection 3 mL (has no administration in time range)  sodium chloride 0.9 % bolus 1,000 mL (has no administration in time range)  pantoprazole (PROTONIX) injection 40 mg (has no administration in time range)  HYDROmorphone (DILAUDID) injection 1 mg (has no administration in time range)     Initial Impression / Assessment and Plan / ED Course  I have reviewed the triage vital signs and the nursing notes.  Pertinent labs & imaging results that were available during my care of the patient were reviewed by me and considered in my medical decision making (see chart for details).   Patient will be admitted to medicine with GI consult.  Patient with persistent abdominal pain and elevated liver enzymes     Final Clinical Impressions(s) / ED Diagnoses   Final diagnoses:  None    ED Discharge Orders    None       ZMilton Ferguson MD 08/27/19 [phone]

## 2019-08-27 NOTE — ED Notes (Signed)
Difficulty obtaining IV access at this time.

## 2019-08-27 NOTE — H&P (Addendum)
History and Physical    Penny [name] YTK:[id number] DOB: 04-Dec-1986 DOA: 08/27/2019  PCP: Kathyrn Drown, MD   Patient coming from: Home  I have personally briefly reviewed patient's old medical records in [place]  Chief Complaint: Abdominal pain  HPI: Penny [name] is a 33 y.o. female with medical history significant for PUD, pseudoseizures and seizures, gastric bypass, neuropathy, ADHD, anxiety and depression.  Patient presented to the ED with 2 weeks of upper abdominal pain, with nausea.  She also reports vomiting on a few days, once a day.  Pain is worse with meals, she has barely been eating.  No diarrhea, she reports constipation.  Abdominal pain is mostly left upper quadrant radiating, now involving her whole upper abdomen, and radiates to her back.  She denies fever or chills.  No black stools.  Cant remember her last bowel movement.  She reports compliance with her Protonix. She also reports NSAID use-800 mg up to 3 times a day, for chronic back pain and now abdominal pain. She reports abdominal pain feels different from her prior peptic ulcer disease pain.  Denies urinary symptoms. This is patient's 5th ED visit since August.  Patient reports she was planned for an echocardiogram for her fast heart rate.  Patient's mother is at bedside and tells me they have a strong family history of hemochromatosis-mother herself, and maternal grandmother.   ED Course: Heart rate 120s, blood pressure systolic 557D.  Elevated AST 720, ALT 368, unremarkable bili and ALP.  WBC 8.  Stable hemoglobin 13.5.  Normal lipase 31.  UA with few bacteria small leukocytes. ED provider consulted gastroenterology Dr. Laural Golden will see patient.   Review of Systems: As per HPI all other systems reviewed and negative.  Past Medical History:  Diagnosis Date  . ADD (attention deficit disorder)   . Anemia 2011   2o to GASTRIC BYPASS  . Anginal pain ([place])   . Anxiety   . Blood transfusion  without reported diagnosis   . Chronic daily headache   . Depression   . Depression   . Dysmenorrhea 09/18/2013  . Dysrhythmia   . Elevated liver enzymes JUL 2011ALK PHOS 111-127 AST  143-267 ALT  213-321T BILI 0.6  ALB  3.7-4.06 Jun 2011 ALK PHOS 118 AST 24 ALT 42 T BILI 0.4 ALB 3.9  . Encounter for drug rehabilitation    at behavioral health for opioid addiction about 4 years ago  . Family history of adverse reaction to anesthesia    'dad had to be kept on pump for breathing for morphine'  . Fatty liver   . Fibroid 01/18/2017  . Fibromyalgia   . Gastric bypass status for obesity   . Gastritis JULY 2011  . Heart murmur   . 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 History of Holter monitoring   . Hx of opioid abuse ([place])    for about 4 years, about 4 years ago  . Interstitial cystitis   . Iron deficiency anemia 07/23/2010  . Irritable bowel syndrome 2012 DIARRHEA   JUN 2012 TTG IgA 14.9  . IUD FEB 2010  . Lupus ([place])   . Menorrhagia 07/18/2013  . Migraines   . Obesity (BMI 30-39.9) 2011 228 LBS BMI 36.8  . Ovarian cyst   . Patient desires pregnancy 09/18/2013  . Polyneuropathy   . PONV (postoperative nausea and vomiting) seizure post-operatively   seizure following ankle surgery  . Potassium (K) deficiency   .  Pregnant 12/25/2013  . Psychiatric pseudoseizure   . RLQ abdominal pain 07/18/2013  . Sciatica of left side 11/14/2014  . 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 Stress 09/18/2013    Past Surgical History:  Procedure Laterality Date  . BIOPSY  04/10/2015   Procedure: BIOPSY;  Surgeon: Daneil Dolin, MD;  Location: [facility];  Service: Endoscopy;;  . cath self every nite     for sodium bicarb injection (discontinued 2013)  . CHOLECYSTECTOMY  2005   biliary dyskinesia  . COLONOSCOPY  JUN 2012 ABD PN/DIARRHEA WITH PROPOFOL   NL COLON  . DILATION AND CURETTAGE OF UTERUS    . DILITATION & CURRETTAGE/HYSTROSCOPY WITH NOVASURE ABLATION N/A  03/24/2017   Procedure: DILATATION & CURETTAGE/HYSTEROSCOPY WITH NOVASURE ENDOMETRIAL ABLATION;  Surgeon: Jonnie Kind, MD;  Location: [facility];  Service: Gynecology;  Laterality: N/A;  . ESOPHAGEAL DILATION N/A 04/10/2015   Procedure: ESOPHAGEAL DILATION WITH 54FR MALONEY DILATOR;  Surgeon: Daneil Dolin, MD;  Location: [facility];  Service: Endoscopy;  Laterality: N/A;  . ESOPHAGOGASTRODUODENOSCOPY    . ESOPHAGOGASTRODUODENOSCOPY (EGD) WITH PROPOFOL N/A 04/10/2015   Procedure: ESOPHAGOGASTRODUODENOSCOPY (EGD) WITH PROPOFOL;  Surgeon: Daneil Dolin, MD;  Location: [facility];  Service: Endoscopy;  Laterality: N/A;  . ESOPHAGOGASTRODUODENOSCOPY (EGD) WITH PROPOFOL N/A 12/06/2016   Procedure: ESOPHAGOGASTRODUODENOSCOPY (EGD) WITH PROPOFOL;  Surgeon: Danie Binder, MD;  [facility];  Service: Endoscopy;  Laterality: N/A;  . ESOPHAGOGASTRODUODENOSCOPY (EGD) WITH PROPOFOL N/A 04/27/2019   Procedure: ESOPHAGOGASTRODUODENOSCOPY (EGD) WITH PROPOFOL;  Surgeon: Rogene Houston, MD;  [facility];  Service: Endoscopy;  Laterality: N/A;  730  . GAB  2007   in [place]-POUCH 5 CM  . GASTRIC BYPASS  06/2006  . HYSTEROSCOPY W/D&C N/A 09/12/2014   Procedure: DILATATION AND CURETTAGE /HYSTEROSCOPY;  Surgeon: Jonnie Kind, MD;  Location: [facility];  Service: Gynecology;  Laterality: N/A;  . KYPHOPLASTY N/A 08/02/2018   Procedure: KYPHOPLASTY T12;  Surgeon: Melina Schools, MD;  Location: [facility];  Service: Orthopedics;  Laterality: N/A;  60 mins  . LAPAROSCOPIC TUBAL LIGATION Bilateral 03/24/2017   Procedure: LAPAROSCOPIC TUBAL LIGATION ([place] Rings);  Surgeon: Jonnie Kind, MD;  Location: [facility];  Service: Gynecology;  Laterality: Bilateral;  . REPAIR VAGINAL CUFF N/A 07/30/2014   Procedure: REPAIR VAGINAL CUFF;  Surgeon: Mora Bellman, MD;  Location: [facility];  Service: Gynecology;  Laterality: N/A;  . SAVORY DILATION  06/20/2012   Dr. Barnie Alderman gastritis/Ulcer in the mid jejunum. Empiric  dilation.   . SURAL NERVE BX Left 02/25/2016   Procedure: LEFT SURAL NERVE BIOPSY;  Surgeon: Jovita Gamma, MD;  Location: [facility];  Service: Neurosurgery;  Laterality: Left;  Left sural nerve biopsy  . TONSILLECTOMY    . TONSILLECTOMY AND ADENOIDECTOMY    . UPPER GASTROINTESTINAL ENDOSCOPY  JULY 2011 NAUSEA-D125,V6, PH 25   Bx; GASTRITIS, POUCH-5 CM LONG  . WISDOM TOOTH EXTRACTION       reports that she has been smoking e-cigarettes. She started smoking about 15 months ago. She has a 1.50 pack-year smoking history. She has never used smokeless tobacco. She reports current alcohol use. She reports previous drug use. Drug: Marijuana.  Allergies  Allergen Reactions  . Gabapentin Other (See Comments)    Pt states that she was unresponsive after taking this medication, but her vitals remained stable.    . Metoclopramide Hcl Anxiety and Other (See Comments)    Pt states that she felt like she  was trapped in a box, and could not get out.  Pt also states that she had temporary loss of movement, weakness, and tingling.    . Tramadol Other (See Comments)    Seizures  . Ativan [Lorazepam] Other (See Comments)    combative  . Latex Itching, Rash and Other (See Comments)    Burns  . Lyrica [Pregabalin] Other (See Comments)    suicidal  . Trazodone And Nefazodone Other (See Comments)    Makes pt "like a zombie"  . Zofran [Ondansetron] Other (See Comments)    Migraines in high doses    . Sulfa Antibiotics Hives  . Butrans [Buprenorphine] Rash    Patch caused rash, didn't help with pain  . Nucynta [Tapentadol] Nausea And Vomiting  . Propofol Nausea And Vomiting  . Tape Itching, Rash and Other (See Comments)    Please use paper tape  . Toradol [Ketorolac Tromethamine] Anxiety    Family History  Problem Relation Age of Onset  . Hemochromatosis Maternal Grandmother   . Migraines Maternal Grandmother   . Cancer Maternal Grandmother   . Breast cancer Maternal Grandmother   .  Hypertension Father   . Diabetes Father   . Coronary artery disease Father   . Migraines Paternal Grandmother   . Breast cancer Paternal Grandmother   . Cancer Mother        breast  . Hemochromatosis Mother   . Breast cancer Mother   . Depression Mother   . Anxiety disorder Mother   . Coronary artery disease Paternal Grandfather   . Anxiety disorder Brother   . Bipolar disorder Brother   . Healthy Daughter   . Healthy Son      Prior to Admission medications   Medication Sig Start Date End Date Taking? Authorizing Provider  amphetamine-dextroamphetamine (ADDERALL XR) 30 MG 24 hr capsule TAKE (1) CAPSULE BY MOUTH EVERY DAY. Patient taking differently: Take 30 mg by mouth See admin instructions. TAKE (1) CAPSULE BY MOUTH 4 TIMES WEEKLY 07/25/19  Yes Cloria Spring, MD  DULoxetine (CYMBALTA) 60 MG capsule Take 1 capsule (60 mg total) by mouth 2 (two) times daily. 07/25/19  Yes Cloria Spring, MD  ondansetron (ZOFRAN ODT) 8 MG disintegrating tablet Take 1 tablet (8 mg total) by mouth every 8 (eight) hours as needed for nausea or vomiting. 08/21/19  Yes Luking, Elayne Snare, MD  pantoprazole (PROTONIX) 40 MG tablet Take 1 tablet (40 mg total) by mouth 2 (two) times daily before a meal. Patient taking differently: Take 40 mg by mouth 3 (three) times daily.  04/27/19  Yes Rehman, Mechele Dawley, MD  phenazopyridine (PYRIDIUM) 100 MG tablet Take 100 mg by mouth 3 (three) times daily as needed for pain.    Yes [provider]  PROAIR HFA 108 (90 Base) MCG/ACT inhaler INHALE 2 PUFFS INTO THE LUNGS EVERY 6 HOURS AS NEEDED. Patient taking differently: Inhale 2 puffs into the lungs every 6 (six) hours as needed for wheezing or shortness of breath.  06/14/17  Yes Luking, Elayne Snare, MD  promethazine (PHENERGAN) 25 MG tablet TAKE ONE TABLET BY MOUTH EVERY 6 HOURS AS NEEDED. Patient taking differently: Take 25 mg by mouth every 6 (six) hours as needed for nausea or vomiting.  07/03/18  Yes Derrek Monaco A,  NP  tiZANidine (ZANAFLEX) 4 MG tablet Take 4 mg by mouth 4 (four) times daily as needed for muscle spasms.  06/07/19  Yes [provider]  topiramate (TOPAMAX) 50 MG tablet TAKE  ONE IN THE AM AND 2 AT NIGHT Patient taking differently: Take 50-100 mg by mouth See admin instructions. Take 1 tablet ('50mg'$  total) in the am and 2 tablets ('100mg'$  total) at night 08/06/19  Yes Cloria Spring, MD  valACYclovir (VALTREX) 1000 MG tablet Take 1 bid for 10 days then Take  1 daily for suppression Patient taking differently: Take 500 mg by mouth See admin instructions. Take 1 twice daily  for 10 days then Take  1 daily for suppression 05/03/19  Yes Derrek Monaco A, NP  zolpidem (AMBIEN) 10 MG tablet Take 1 tablet (10 mg total) by mouth at bedtime. 07/25/19 08/27/19 Yes Cloria Spring, MD  cyclobenzaprine (FLEXERIL) 5 MG tablet Take 1-2 tablets (5-10 mg total) by mouth 2 (two) times daily as needed for muscle spasms. Patient not taking: Reported on 08/21/2019 07/14/19   Noe Gens, PA-C  predniSONE (DELTASONE) 20 MG tablet 2 tabs po daily x 3 days Patient not taking: Reported on 08/27/2019 08/25/19   Elnora Morrison, MD    Physical Exam: Vitals:   08/27/19 1232 08/27/19 1730 08/27/19 1800  BP: (!) 124/91 (!) 130/96 (!) 128/92  Pulse: (!) 121 93 92  Resp: 15    Temp: 97.7 F (36.5 C)    TempSrc: Oral    SpO2: 100% 100% 96%  Weight: 90.7 kg    Height: '5\' 6"'$  (1.676 m)      Constitutional: NAD, calm, comfortable Vitals:   08/27/19 1232 08/27/19 1730 08/27/19 1800  BP: (!) 124/91 (!) 130/96 (!) 128/92  Pulse: (!) 121 93 92  Resp: 15    Temp: 97.7 F (36.5 C)    TempSrc: Oral    SpO2: 100% 100% 96%  Weight: 90.7 kg    Height: '5\' 6"'$  (1.676 m)     Eyes: PERRL, lids and conjunctivae normal ENMT: Mucous membranes are dry. Posterior pharynx clear of any exudate or lesions.Normal dentition.  Neck: normal, supple, no masses, no thyromegaly Respiratory: clear to auscultation bilaterally, no  wheezing, no crackles. Normal respiratory effort. No accessory muscle use.  Cardiovascular: Regular rate and rhythm, no murmurs / rubs / gallops. No extremity edema. 2+ pedal pulses. No carotid bruits.  Abdomen: Diffuse upper abdominal tenderness, worsening left upper quadrant, no masses palpated. No hepatosplenomegaly. Bowel sounds positive.  Musculoskeletal: no clubbing / cyanosis. No joint deformity upper and lower extremities. Good ROM, no contractures. Normal muscle tone.  Skin: no rashes, lesions, ulcers. No induration Neurologic: CN 2-12 grossly intact.  Strength 5/5 in all 4.  Psychiatric: Normal judgment and insight. Alert and oriented x 3. Normal mood.   Labs on Admission: I have personally reviewed following labs and imaging studies  CBC: Recent Labs  Lab 08/23/19 2317 08/25/19 1823 08/27/19 1257  WBC 8.6 8.6 8.0  HGB 13.9 14.0 13.5  HCT 41.7 41.3 39.3  MCV 93.9 94.3 93.3  PLT 262 279 009   Basic Metabolic Panel: Recent Labs  Lab 08/23/19 2317 08/25/19 1823 08/27/19 1257  NA 139 141 138  K 3.8 4.0 4.1  CL 113* 110 113*  CO2 17* 20* 16*  GLUCOSE 101* 71 86  BUN '19 11 19  '$ CREATININE 0.78 0.79 0.65  CALCIUM 9.3 9.2 8.8*   Liver Function Tests: Recent Labs  Lab 08/27/19 1257  AST 720*  ALT 368*  ALKPHOS 88  BILITOT 0.8  PROT 7.1  ALBUMIN 4.2   Recent Labs  Lab 08/25/19 1939 08/27/19 1257  LIPASE 32 31   Thyroid Function  Tests: Recent Labs    08/25/19 1939  TSH 2.616  FREET4 0.79   Urine analysis:    Component Value Date/Time   COLORURINE AMBER (A) 08/27/2019 1236   APPEARANCEUR HAZY (A) 08/27/2019 1236   APPEARANCEUR Cloudy (A) 03/30/2017 1600   LABSPEC 1.020 08/27/2019 1236   PHURINE 5.0 08/27/2019 1236   GLUCOSEU NEGATIVE 08/27/2019 1236   HGBUR NEGATIVE 08/27/2019 1236   BILIRUBINUR NEGATIVE 08/27/2019 1236   BILIRUBINUR negative 07/14/2019 1421   BILIRUBINUR Negative 03/30/2017 1600   KETONESUR NEGATIVE 08/27/2019 1236   PROTEINUR  NEGATIVE 08/27/2019 1236   UROBILINOGEN 0.2 07/14/2019 1421   UROBILINOGEN 4.0 (H) 08/17/2015 1300   NITRITE NEGATIVE 08/27/2019 1236   LEUKOCYTESUR SMALL (A) 08/27/2019 1236    Radiological Exams on Admission: Dg Chest 2 View  Result Date: 08/25/2019 CLINICAL DATA:  Shortness of breath EXAM: CHEST - 2 VIEW COMPARISON:  08/23/2019 FINDINGS: The heart size and mediastinal contours are within normal limits. Both lungs are clear. The visualized skeletal structures are unremarkable. IMPRESSION: No acute abnormality of the lungs. Electronically Signed   By: Eddie Candle M.D.   On: 08/25/2019 18:47   US Abdomen Complete  Result Date: 08/27/2019 CLINICAL DATA:  Abdominal pain and nausea for 2 weeks. EXAM: ABDOMEN ULTRASOUND COMPLETE COMPARISON:  CT abdomen and pelvis 08/25/2019. FINDINGS: Gallbladder: Removed. Common bile duct: Diameter: 1.1 cm proximally, unchanged. Liver: No focal lesion identified. Within normal limits in parenchymal echogenicity. Portal vein is patent on color Doppler imaging with normal direction of blood flow towards the liver. IVC: No abnormality visualized. Pancreas: Not visualized. Spleen: Size and appearance within normal limits. Right Kidney: Length: Approximately 10.4 cm. Echogenicity within normal limits. No mass or hydronephrosis visualized. Left Kidney: Length: Not visualized. Abdominal aorta: No aneurysm visualized. Other findings: None. IMPRESSION: No acute abnormality or finding to explain the patient's symptoms. The pancreas and left kidney are not seen due to overlying bowel gas. Status post cholecystectomy Electronically Signed   By: Inge Rise M.D.   On: 08/27/2019 16:18   Ct Abdomen Pelvis W Contrast  Result Date: 08/25/2019 CLINICAL DATA:  Abdominal pain. Hernia. EXAM: CT ABDOMEN AND PELVIS WITH CONTRAST TECHNIQUE: Multidetector CT imaging of the abdomen and pelvis was performed using the standard protocol following bolus administration of intravenous  contrast. CONTRAST:  125m OMNIPAQUE IOHEXOL 300 MG/ML  SOLN COMPARISON:  December 18, 2017. FINDINGS: Lower chest: The lung bases are clear. The heart size is normal. Hepatobiliary: The liver is normal. Status post cholecystectomy.There is no biliary ductal dilation. Pancreas: Normal contours without ductal dilatation. No peripancreatic fluid collection. Spleen: No splenic laceration or hematoma. Adrenals/Urinary Tract: --Adrenal glands: No adrenal hemorrhage. --Right kidney/ureter: No hydronephrosis or perinephric hematoma. --Left kidney/ureter: No hydronephrosis or perinephric hematoma. --Urinary bladder: Unremarkable. Stomach/Bowel: --Stomach/Duodenum: The patient is status post prior gastric bypass. --Small bowel: No dilatation or inflammation. --Colon: No focal abnormality. --Appendix: Normal. Vascular/Lymphatic: Normal course and caliber of the major abdominal vessels. --No retroperitoneal lymphadenopathy. --there are mildly prominent mesenteric lymph nodes. --No pelvic or inguinal lymphadenopathy. Reproductive: Unremarkable Other: No ascites or free air. The abdominal wall is normal. Musculoskeletal. The patient is status post prior vertebral augmentation of the T12 vertebral body. IMPRESSION: No acute abdominopelvic abnormality. Electronically Signed   By: CConstance HolsterM.D.   On: 08/25/2019 20:57    EKG: Independently reviewed.  Sinus tachycardia. QTc 583.  Assessment/Plan Active Problems:   Elevated liver enzymes    Elevated liver enzymes- AST 720, ALT 368, with normal  ALP and bilirubin.  Liver enzymes normal last checked 04/2019.  Family history of hemochromatosis.  Right upper quadrant ultrasound today-negative for acute abnormality, s/p cholecystectomy.  She denies alcohol intake -Acute hepatitis panel -Blood alcohol level  -Iron panel in a.m. -GI consulted will see patient  Abdominal pain-history of peptic ulcer disease, with ongoing significant NSAID use-ibuprofen 800 mg at least 3  times daily.  Hemoglobin stable 13.5.  Denies melena.  Last EGD 04/2019 - gastrojejunal anastomosis characterized by ulceration. Two ulcers. One along anterior wall is 3 x 69m. Other ulcer is posterior about 7.  Recent abdominal CT 9/19 was negative for acute abnormality. -GI consult. -Patient reports morphine gives her chest pain, requesting Dilaudid.  -CBC a.m. -Bowel rest with clear liquid diet, N.p.o. midnight - D5 Ns 100cc/hr x 1 day -IV Protonix 40 mg twice daily.  Tachycardia-heart rate in the 120s >> 80s.  Likely from dehydration.  Patient had out patient ECHO planned for today for tachycardia. -Continue hydration.  Depression, anxiety, ADHD, seizures, pseudoseizures, chronic pain- Stable.  Currently not taking Adderall due to above symptoms. -Resume home Cymbalta, Topamax, PRN tizanidine  History of gastric bypass  Prolonged QTC- 583.  Patient on QT prolonging medications. - Check Mag   DVT prophylaxis: SCDs Code Status: Full Family Communication: Mother at bedside Disposition Plan: Per rounding team Consults called: GI Admission status: Obs, tele   EBethena RoysMD [facility]  08/27/2019, 7:08 PM

## 2019-08-27 NOTE — ED Notes (Signed)
Hospitalist at bedside 

## 2019-08-27 NOTE — ED Triage Notes (Signed)
Pt is having severe abdomina pain with Nausea and Vomiting. Has been going on for 2 weeks. Sent by Dr. Matthew Saras. Pt is tachy at a rate of 120
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
## 2019-08-27 NOTE — ED Notes (Signed)
Pt going to ultrasound.

## 2019-08-27 NOTE — Progress Notes (Signed)
Subjective:    Patient ID: Penny [name], female    DOB: 01-30-1986, 33 y.o.   MRN: [id number]  HPI Penny [name] is a 33 year old female with a complex past medical history of depression, opioid addiction, anemia, gastric bypass 2007, Lupus, seizure disorder.  She has been in the emergency room on 3 occasions over the past 2 weeks with chest pain, shortness of breath and abdominal pain.  She presented to [facility] 08/16/2019 with shortness of breath for the past week.  A chest x-ray was negative.  Laboratory results were unremarkable.  Sodium 140.  Glucose 89.  BUN 17.  Creatinine 0.72.  Alk phos 51.  AST 16.  ALT 15.  Total bili 0.5.  WBC 7.3.  Hemoglobin 12.7.  Hematocrit 38.5.  Platelet 221.  She was discharged home.  A chest CT was not done.  She presented back to [facility] ER by EMS on 08/23/2019 with persistent shortness of breath with chest tightness and tachycardia.  Apparently the EMS reported she had an episode of SVT with a heart rate of 170.  She was dyspneic with wheezing upon arrival to the emergency room.  She was discharged home.  She presented to [facility] emergency room 08/25/2019 with shortness of breath and upper abdominal pain with nausea and vomiting for the past 3 days.  She was tachycardia, heart rate 125.  An EKG showed sinus tachycardia without evidence of ischemia.  Troponin was negative.  Her d-dimer was negative with low suspicion of PE.  Chest x-ray was negative.  An abdominal/pelvic CT was negative.  BMP and CBC were normal.  She presents to our outpatient GI office today for further evaluation.  She ambulated into the office with the assistance of her mother and stepfather, she demonstrated generalized weakness and needed assistance to the exam table.  She is tearful.  She complains of having severe upper abdominal pain.  She describes having chest pain that radiates to her left breast and to the left upper back.  The upper  abdominal pain is now radiating to the right upper quadrant for the past 2 days.  She continues to have shortness of breath which is no longer severe.  No hemoptysis.  She is unable to eat.  She is taking Ibuprofen 800 mg every 8 hours for abdominal and back pain she is having significant nausea despite taking Zofran 4 mg every 8 hours.  She vomited 3-4 times last week.  No vomiting today.  She reported having a syncopal episode at home while she was standing up, she blacked out briefly and fell to the ground.  She is scheduled for an echocardiogram later today.  She is under evaluation for pots syndrome by cardiologist Dr. Georgann Housekeeper.  She reported having a Holter monitor in July that was negative. Occasional marijuana use, no other drug use.  No alcohol.  He has not taken Adderall for least 1 week.   A chest, abdomen and pelvis CT angiogram 04/21/2019: 1. Normal contour and caliber of the aorta. No evidence of aortic dissection, aneurysm, or other aortic pathology. No significant atherosclerosis. 2.  Status post gastric bypass and cholecystectomy. 3. Large burden of stool in the colon and dense stool balls in the distal colon and rectum.  Past Medical History:  Diagnosis Date  . ADD (attention deficit disorder)   . Anemia 2011   2o to GASTRIC BYPASS  . Anginal pain ([place])   . Anxiety   .  Blood transfusion without reported diagnosis   . Chronic daily headache   . Depression   . Depression   . Dysmenorrhea 09/18/2013  . Dysrhythmia   . Elevated liver enzymes JUL 2011ALK PHOS 111-127 AST  143-267 ALT  213-321T BILI 0.6  ALB  3.7-4.06 Jun 2011 ALK PHOS 118 AST 24 ALT 42 T BILI 0.4 ALB 3.9  . Encounter for drug rehabilitation    at behavioral health for opioid addiction about 4 years ago  . Family history of adverse reaction to anesthesia    'dad had to be kept on pump for breathing for morphine'  . Fatty liver   . Fibroid 01/18/2017  . Fibromyalgia   . Gastric bypass status for obesity    . Gastritis JULY 2011  . Heart murmur   . 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 History of Holter monitoring   . Hx of opioid abuse ([place])    for about 4 years, about 4 years ago  . Interstitial cystitis   . Iron deficiency anemia 07/23/2010  . Irritable bowel syndrome 2012 DIARRHEA   JUN 2012 TTG IgA 14.9  . IUD FEB 2010  . Lupus ([place])   . Menorrhagia 07/18/2013  . Migraines   . Obesity (BMI 30-39.9) 2011 228 LBS BMI 36.8  . Ovarian cyst   . Patient desires pregnancy 09/18/2013  . Polyneuropathy   . PONV (postoperative nausea and vomiting) seizure post-operatively   seizure following ankle surgery  . Potassium (K) deficiency   . Pregnant 12/25/2013  . Psychiatric pseudoseizure   . RLQ abdominal pain 07/18/2013  . Sciatica of left side 11/14/2014  . 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 Stress 09/18/2013   Current Outpatient Medications on File Prior to Visit  Medication Sig Dispense Refill  . amphetamine-dextroamphetamine (ADDERALL XR) 30 MG 24 hr capsule TAKE (1) CAPSULE BY MOUTH EVERY DAY. 30 capsule 0  . DULoxetine (CYMBALTA) 60 MG capsule Take 1 capsule (60 mg total) by mouth 2 (two) times daily. 180 capsule 2  . ondansetron (ZOFRAN ODT) 8 MG disintegrating tablet Take 1 tablet (8 mg total) by mouth every 8 (eight) hours as needed for nausea or vomiting. 18 tablet 1  . pantoprazole (PROTONIX) 40 MG tablet Take 1 tablet (40 mg total) by mouth 2 (two) times daily before a meal. 60 tablet 3  . PROAIR HFA 108 (90 Base) MCG/ACT inhaler INHALE 2 PUFFS INTO THE LUNGS EVERY 6 HOURS AS NEEDED. (Patient taking differently: Inhale 2 puffs into the lungs every 6 (six) hours as needed for wheezing or shortness of breath. ) 8.5 g 0  . promethazine (PHENERGAN) 25 MG tablet TAKE ONE TABLET BY MOUTH EVERY 6 HOURS AS NEEDED. (Patient taking differently: Take 25 mg by mouth every 6 (six) hours as needed for nausea or vomiting. ) 30 tablet 0  . tiZANidine (ZANAFLEX) 4  MG tablet TAKE 1 TABLET BY MOUTH FOUR TIMES A DAY AS NEEDED    . topiramate (TOPAMAX) 50 MG tablet TAKE ONE IN THE AM AND 2 AT NIGHT 90 tablet 0  . valACYclovir (VALTREX) 1000 MG tablet Take 1 bid for 10 days then Take  1 daily for suppression 90 tablet prn  . zolpidem (AMBIEN) 10 MG tablet Take 1 tablet (10 mg total) by mouth at bedtime. 30 tablet 2  . cyclobenzaprine (FLEXERIL) 5 MG tablet Take 1-2 tablets (5-10 mg total) by mouth 2 (two) times daily as needed for muscle spasms. (Patient  not taking: Reported on 08/21/2019) 30 tablet 0  . predniSONE (DELTASONE) 20 MG tablet 2 tabs po daily x 3 days (Patient not taking: Reported on 08/27/2019) 6 tablet 0   No current facility-administered medications on file prior to visit.    Allergies  Allergen Reactions  . Gabapentin Other (See Comments)    Pt states that she was unresponsive after taking this medication, but her vitals remained stable.    . Metoclopramide Hcl Anxiety and Other (See Comments)    Pt states that she felt like she was trapped in a box, and could not get out.  Pt also states that she had temporary loss of movement, weakness, and tingling.    . Tramadol Other (See Comments)    Seizures  . Ativan [Lorazepam] Other (See Comments)    combative  . Latex Itching, Rash and Other (See Comments)    Burns  . Lyrica [Pregabalin] Other (See Comments)    suicidal  . Trazodone And Nefazodone Other (See Comments)    Makes pt "like a zombie"  . Zofran [Ondansetron] Other (See Comments)    Migraines in high doses    . Sulfa Antibiotics Hives  . Butrans [Buprenorphine] Rash    Patch caused rash, didn't help with pain  . Nucynta [Tapentadol] Nausea And Vomiting  . Propofol Nausea And Vomiting  . Tape Itching, Rash and Other (See Comments)    Please use paper tape  . Toradol [Ketorolac Tromethamine] Anxiety    Review of Systems see HPI, all other systems reviewed and are negative     Objective:   Physical Exam  Blood pressure  135/90, pulse (!) 125, temperature 98.2 F (36.8 C), temperature source Oral, height '5\' 6"'$  (1.676 m), weight 200 lb 1.6 oz (90.8 kg). General: 33 year old obese white female tearful presents with generalized weakness Eyes: Sclera nonicteric, conjunctiva pink Heart: Tachycardic, 1/6 systolic murmur Lungs: Breath sounds clear throughout Abdomen: Moderate left upper quadrant, epigastric and right upper quadrant tenderness without rebound or guarding, trace tenderness throughout the lower abdomen, positive bowel sounds to all 4 quadrants, no HSM Extremities: No edema Neuro: Anxious, responds to questions appropriately    Assessment & Plan:   34.  33 year old female with a complex past medical history presents with nausea, severe upper abdominal pain with profound weakness with syncopal episode at home this am. Patient has been in the emergency room 3 times within the past 2 weeks with chest pain, shortness of breath and upper abdominal pain. + NSAID use.  -  I recommend hospital admission for further evaluation to include an abdominal ultrasound,repeat labs and EGD after cleared by cardiology  2.  Shortness of breath x 2 weeks -CT angiogram   3.  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  Psychiatric disorder, anxiety -psych evaluation as an inpatient   Further recommendations per Dr. Laural Golden

## 2019-08-27 NOTE — Patient Instructions (Signed)
1. Patient sent to [facility] ER for possible admission

## 2019-08-28 ENCOUNTER — Observation Stay (HOSPITAL_COMMUNITY): Payer: Medicaid Other

## 2019-08-28 DIAGNOSIS — J9801 Acute bronchospasm: Secondary | ICD-10-CM | POA: Diagnosis not present

## 2019-08-28 DIAGNOSIS — R110 Nausea: Secondary | ICD-10-CM | POA: Diagnosis not present

## 2019-08-28 DIAGNOSIS — R55 Syncope and collapse: Secondary | ICD-10-CM | POA: Diagnosis not present

## 2019-08-28 DIAGNOSIS — M797 Fibromyalgia: Secondary | ICD-10-CM | POA: Diagnosis present

## 2019-08-28 DIAGNOSIS — K8309 Other cholangitis: Secondary | ICD-10-CM | POA: Diagnosis present

## 2019-08-28 DIAGNOSIS — E559 Vitamin D deficiency, unspecified: Secondary | ICD-10-CM | POA: Diagnosis present

## 2019-08-28 DIAGNOSIS — R9431 Abnormal electrocardiogram [ECG] [EKG]: Secondary | ICD-10-CM | POA: Diagnosis present

## 2019-08-28 DIAGNOSIS — F988 Other specified behavioral and emotional disorders with onset usually occurring in childhood and adolescence: Secondary | ICD-10-CM | POA: Diagnosis not present

## 2019-08-28 DIAGNOSIS — F1210 Cannabis abuse, uncomplicated: Secondary | ICD-10-CM | POA: Diagnosis present

## 2019-08-28 DIAGNOSIS — Z9884 Bariatric surgery status: Secondary | ICD-10-CM | POA: Diagnosis not present

## 2019-08-28 DIAGNOSIS — K219 Gastro-esophageal reflux disease without esophagitis: Secondary | ICD-10-CM | POA: Diagnosis not present

## 2019-08-28 DIAGNOSIS — F410 Panic disorder [episodic paroxysmal anxiety] without agoraphobia: Secondary | ICD-10-CM | POA: Diagnosis not present

## 2019-08-28 DIAGNOSIS — F445 Conversion disorder with seizures or convulsions: Secondary | ICD-10-CM | POA: Diagnosis present

## 2019-08-28 DIAGNOSIS — Z980 Intestinal bypass and anastomosis status: Secondary | ICD-10-CM | POA: Diagnosis not present

## 2019-08-28 DIAGNOSIS — E860 Dehydration: Secondary | ICD-10-CM | POA: Diagnosis present

## 2019-08-28 DIAGNOSIS — Z20828 Contact with and (suspected) exposure to other viral communicable diseases: Secondary | ICD-10-CM | POA: Diagnosis not present

## 2019-08-28 DIAGNOSIS — Z87891 Personal history of nicotine dependence: Secondary | ICD-10-CM | POA: Diagnosis not present

## 2019-08-28 DIAGNOSIS — R109 Unspecified abdominal pain: Secondary | ICD-10-CM | POA: Diagnosis not present

## 2019-08-28 DIAGNOSIS — R740 Nonspecific elevation of levels of transaminase and lactic acid dehydrogenase [LDH]: Secondary | ICD-10-CM | POA: Diagnosis present

## 2019-08-28 DIAGNOSIS — M549 Dorsalgia, unspecified: Secondary | ICD-10-CM | POA: Diagnosis present

## 2019-08-28 DIAGNOSIS — F329 Major depressive disorder, single episode, unspecified: Secondary | ICD-10-CM | POA: Diagnosis present

## 2019-08-28 DIAGNOSIS — R0602 Shortness of breath: Secondary | ICD-10-CM | POA: Diagnosis not present

## 2019-08-28 DIAGNOSIS — K589 Irritable bowel syndrome without diarrhea: Secondary | ICD-10-CM | POA: Diagnosis not present

## 2019-08-28 DIAGNOSIS — R0600 Dyspnea, unspecified: Secondary | ICD-10-CM | POA: Diagnosis not present

## 2019-08-28 DIAGNOSIS — F1121 Opioid dependence, in remission: Secondary | ICD-10-CM | POA: Diagnosis present

## 2019-08-28 DIAGNOSIS — R748 Abnormal levels of other serum enzymes: Secondary | ICD-10-CM | POA: Diagnosis not present

## 2019-08-28 DIAGNOSIS — K287 Chronic gastrojejunal ulcer without hemorrhage or perforation: Secondary | ICD-10-CM | POA: Diagnosis not present

## 2019-08-28 DIAGNOSIS — Z79899 Other long term (current) drug therapy: Secondary | ICD-10-CM | POA: Diagnosis not present

## 2019-08-28 DIAGNOSIS — R1010 Upper abdominal pain, unspecified: Secondary | ICD-10-CM | POA: Diagnosis not present

## 2019-08-28 DIAGNOSIS — F909 Attention-deficit hyperactivity disorder, unspecified type: Secondary | ICD-10-CM | POA: Diagnosis present

## 2019-08-28 DIAGNOSIS — G43809 Other migraine, not intractable, without status migrainosus: Secondary | ICD-10-CM | POA: Diagnosis present

## 2019-08-28 DIAGNOSIS — R0609 Other forms of dyspnea: Secondary | ICD-10-CM | POA: Diagnosis not present

## 2019-08-28 DIAGNOSIS — R945 Abnormal results of liver function studies: Secondary | ICD-10-CM | POA: Diagnosis not present

## 2019-08-28 DIAGNOSIS — K838 Other specified diseases of biliary tract: Secondary | ICD-10-CM | POA: Diagnosis not present

## 2019-08-28 DIAGNOSIS — G8929 Other chronic pain: Secondary | ICD-10-CM | POA: Diagnosis present

## 2019-08-28 DIAGNOSIS — Z9049 Acquired absence of other specified parts of digestive tract: Secondary | ICD-10-CM | POA: Diagnosis not present

## 2019-08-28 DIAGNOSIS — F419 Anxiety disorder, unspecified: Secondary | ICD-10-CM | POA: Diagnosis present

## 2019-08-28 LAB — IRON AND TIBC
Iron: 171 ug/dL — ABNORMAL HIGH (ref 28–170)
Saturation Ratios: 53 % — ABNORMAL HIGH (ref 10.4–31.8)
TIBC: 325 ug/dL (ref 250–450)
UIBC: 154 ug/dL

## 2019-08-28 LAB — COMPREHENSIVE METABOLIC PANEL
ALT: 794 U/L — ABNORMAL HIGH (ref 0–44)
AST: 760 U/L — ABNORMAL HIGH (ref 15–41)
Albumin: 3.9 g/dL (ref 3.5–5.0)
Alkaline Phosphatase: 143 U/L — ABNORMAL HIGH (ref 38–126)
Anion gap: 5 (ref 5–15)
BUN: 7 mg/dL (ref 6–20)
CO2: 23 mmol/L (ref 22–32)
Calcium: 8.9 mg/dL (ref 8.9–10.3)
Chloride: 113 mmol/L — ABNORMAL HIGH (ref 98–111)
Creatinine, Ser: 0.52 mg/dL (ref 0.44–1.00)
GFR calc Af Amer: >60 mL/min (ref >60)
GFR calc non Af Amer: >60 mL/min (ref >60)
Glucose, Bld: 105 mg/dL — ABNORMAL HIGH (ref 70–99)
Potassium: 3.8 mmol/L (ref 3.5–5.1)
Sodium: 141 mmol/L (ref 135–145)
Total Bilirubin: 0.6 mg/dL (ref 0.3–1.2)
Total Protein: 6.5 g/dL (ref 6.5–8.1)

## 2019-08-28 LAB — SARS CORONAVIRUS 2 (TAT 6-24 HRS): SARS Coronavirus 2: NEGATIVE

## 2019-08-28 LAB — CBC
HCT: 40.9 % (ref 36.0–46.0)
Hemoglobin: 13 g/dL (ref 12.0–15.0)
MCH: 31.2 pg (ref 26.0–34.0)
MCHC: 31.8 g/dL (ref 30.0–36.0)
MCV: 98.1 fL (ref 80.0–100.0)
Platelets: 206 10*3/uL (ref 150–400)
RBC: 4.17 MIL/uL (ref 3.87–5.11)
RDW: 13.9 % (ref 11.5–15.5)
WBC: 3.7 10*3/uL — ABNORMAL LOW (ref 4.0–10.5)
nRBC: 0 % (ref 0.0–0.2)

## 2019-08-28 LAB — FERRITIN: Ferritin: 371 ng/mL — ABNORMAL HIGH (ref 11–307)

## 2019-08-28 MED ORDER — OXYCODONE HCL 5 MG PO TABS
5.0000 mg | ORAL_TABLET | ORAL | Status: AC | PRN
  Administered 2019-08-28 – 2019-08-30 (×2): 5 mg via ORAL
  Filled 2019-08-28 (×2): qty 1

## 2019-08-28 MED ORDER — CLONAZEPAM 0.25 MG PO TBDP
0.5000 mg | ORAL_TABLET | Freq: Once | ORAL | Status: AC | PRN
  Administered 2019-08-28: 0.5 mg via ORAL
  Filled 2019-08-28: qty 2

## 2019-08-28 MED ORDER — CLONAZEPAM 0.5 MG PO TABS
0.5000 mg | ORAL_TABLET | Freq: Once | ORAL | Status: AC
  Administered 2019-08-28: 0.5 mg via ORAL
  Filled 2019-08-28: qty 1

## 2019-08-28 NOTE — Progress Notes (Signed)
Subjective:  Patient continues to complain of pain across upper abdomen.  She is hungry.  She wants to try real food rather than liquid diet.  No melena or rectal bleeding reported.  Objective: Blood pressure 121/88, pulse 73, temperature 97.9 F (36.6 C), temperature source Oral, resp. rate 16, height '5\' 6"'$  (1.676 m), weight 90.7 kg, SpO2 98 %. Patient is alert and in no acute distress. Sclera is nonicteric. Abdomen is symmetrical.  On palpation is soft.  She has mild tenderness across her upper abdomen more pronounced in epigastrium to the right of midline.  No organomegaly or masses. No LE edema noted.  Labs/studies Results:  CBC Latest Ref Rng & Units 08/28/2019 08/27/2019 08/25/2019  WBC 4.0 - 10.5 K/uL 3.7(L) 8.0 8.6  Hemoglobin 12.0 - 15.0 g/dL 13.0 13.5 14.0  Hematocrit 36.0 - 46.0 % 40.9 39.3 41.3  Platelets 150 - 400 K/uL 206 224 279    CMP Latest Ref Rng & Units 08/28/2019 08/27/2019 08/25/2019  Glucose 70 - 99 mg/dL 105(H) 86 71  BUN 6 - 20 mg/dL '7 19 11  '$ Creatinine 0.44 - 1.00 mg/dL 0.52 0.65 0.79  Sodium 135 - 145 mmol/L 141 138 141  Potassium 3.5 - 5.1 mmol/L 3.8 4.1 4.0  Chloride 98 - 111 mmol/L 113(H) 113(H) 110  CO2 22 - 32 mmol/L 23 16(L) 20(L)  Calcium 8.9 - 10.3 mg/dL 8.9 8.8(L) 9.2  Total Protein 6.5 - 8.1 g/dL 6.5 7.1 -  Total Bilirubin 0.3 - 1.2 mg/dL 0.6 0.8 -  Alkaline Phos 38 - 126 U/L 143(H) 88 -  AST 15 - 41 U/L 760(H) 720(H) -  ALT 0 - 44 U/L 794(H) 368(H) -    Hepatic Function Latest Ref Rng & Units 08/28/2019 08/27/2019 08/17/2019  Total Protein 6.5 - 8.1 g/dL 6.5 7.1 6.6  Albumin 3.5 - 5.0 g/dL 3.9 4.2 4.2  AST 15 - 41 U/L 760(H) 720(H) 16  ALT 0 - 44 U/L 794(H) 368(H) 15  Alk Phosphatase 38 - 126 U/L 143(H) 88 51  Total Bilirubin 0.3 - 1.2 mg/dL 0.6 0.8 0.5  Bilirubin, Direct <=0.2 mg/dL - - -    MRCP images reviewed with Dr. Lavonia Dana.  Study read by Dr. Lin Landsman.  Biliary system mildly dilated but no evidence of choledocholithiasis.  Long cystic  duct remnant with very low takeoff.  Serum iron 171, TIBC 325 and saturation 53%. Serum ferritin 371. Serum ferritin was 6 on 04/18/2019.  Viral markers are pending. Stool H. pylori antigen pending.   Assessment:  #1.  Elevated transaminases most likely secondary to sphincter of Oddi dysfunction or microlithiasis.  Acute hepatitis panel pending.  However patient's presentation is not consistent with acute viral hepatitis.  She may eventually need ERCP and if ERCP is not possible may have to be percutaneous approach.  Still waiting for surgery notes from [facility] hospital.  #2.  Abdominal pain.  Suspect most of her pain is biliary colic but she also has history of peptic ulcer disease and she had been taking high-dose ibuprofen which could made her ulcer disease worse.  Last EGD was on 04/27/2019.  #3.  History of syncope and chest pain.  ECHO is pending.  #4.  Serum iron and iron saturation elevated.  She has history of iron deficiency anemia which has been well documented.  Serum ferritin may be spuriously elevated in the setting of hepatic injury.  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 Iron studies will be repeated when transaminases are normal.   Recommendations  Diet advanced to heart healthy. Patient instructed to eat only half a meal at a time and other half maybe 2 hours later. LFTs in a.m.

## 2019-08-28 NOTE — Progress Notes (Addendum)
PROGRESS NOTE  Penny [name]  [id number]  DOB: 12-09-1985  DOA: 08/27/2019 PCP: Penny Drown, MD  Brief Admission Hx: 33 y.o. female with medical history significant for PUD, pseudoseizures and seizures, gastric bypass, neuropathy, ADHD, anxiety and depression.  Patient presented to the ED with 2 weeks of upper abdominal pain, with nausea and elevated liver enzymes.   MDM/Assessment & Plan:   1. Elevated transaminases - Pt is being sent by GI for MRCP and further recommendations pending the imaging results.  Her LFTs continue to rise.  Continue supportive care for now.  Pt reports anxiety for MRI tests and has taken clonazepam prior to MRI and has done well.  Will order for her small 1 time dose so that she may be able to complete the MRI.  2. Abdominal pain - Pt had a CT 9/19 with no clear findings to account for symptoms.  MRCP is pending.  Appreciate assistance of GI consultant.  Pt currently NPO pending imaging and for bowel rest.  Continue protonix for GI protection.  3. Sinus tachycardia - Treating with hydration.  4. Behavioral  Health - Pt reports adderall on hold due to palpitations.  Resumed on home cymbalta, topamax.  5. History of gastric bypass - check 25-OH vitamin D level.  6. Prolonged Qtc - follow and replete magnesium as needed.    DVT prophylaxis: SCDs Code Status: Full  Family Communication: patient updated at bedside, verbalized understanding  Disposition Plan: inpatient for subspecialty consultation, IV hydration   Consultants:  GI ([place])  Procedures:    Antimicrobials:     Subjective: Pt reports that she continues to have abdominal pain but no worse than when she arrived.  No emesis.  No diarrhea. No chest pain and no SOB.   Objective: Vitals:   08/27/19 1949 08/27/19 2124 08/28/19 0518 08/28/19 0817  BP:  (!) 133/103 121/88   Pulse:  88 73   Resp:  16 16   Temp:  97.8 F (36.6 C) 97.9 F (36.6 C)   TempSrc:  Oral Oral   SpO2:  100% 100% 96% 98%  Weight:      Height:        Intake/Output Summary (Last 24 hours) at 08/28/2019 1240 Last data filed at 08/28/2019 0900 Gross per 24 hour  Intake 0 ml  Output 1 ml  Net -1 ml   Filed Weights   08/27/19 1232  Weight: 90.7 kg   REVIEW OF SYSTEMS  As per history otherwise all reviewed and reported negative  Exam:  General exam: awake, alert, cooperative, NAD.  Respiratory system:  No increased work of breathing. Cardiovascular system: S1 & S2 heard. No JVD, murmurs, gallops, clicks or pedal edema. Gastrointestinal system: Abdomen is nondistended, soft and nontender. Normal bowel sounds heard. Central nervous system: Alert and oriented. No focal neurological deficits. Extremities: no CCE.  Data Reviewed: Basic Metabolic Panel: Recent Labs  Lab 08/23/19 2317 08/25/19 1823 08/27/19 1257 08/28/19 0655  NA 139 141 138 141  K 3.8 4.0 4.1 3.8  CL 113* 110 113* 113*  CO2 17* 20* 16* 23  GLUCOSE 101* 71 86 105*  BUN 19 11 19 7   CREATININE 0.78 0.79 0.65 0.52  CALCIUM 9.3 9.2 8.8* 8.9  MG  --   --  2.1  --    Liver Function Tests: Recent Labs  Lab 08/27/19 1257 08/28/19 0655  AST 720* 760*  ALT 368* 794*  ALKPHOS 88 143*  BILITOT 0.8 0.6  PROT 7.1  6.5  ALBUMIN 4.2 3.9   Recent Labs  Lab 08/25/19 1939 08/27/19 1257  LIPASE 32 31   No results for input(s): AMMONIA in the last 168 hours. CBC: Recent Labs  Lab 08/23/19 2317 08/25/19 1823 08/27/19 1257 08/28/19 0655  WBC 8.6 8.6 8.0 3.7*  HGB 13.9 14.0 13.5 13.0  HCT 41.7 41.3 39.3 40.9  MCV 93.9 94.3 93.3 98.1  PLT 262 279 224 206   Cardiac Enzymes: No results for input(s): CKTOTAL, CKMB, CKMBINDEX, TROPONINI in the last 168 hours. CBG (last 3)  No results for input(s): GLUCAP in the last 72 hours. Recent Results (from the past 240 hour(s))  SARS CORONAVIRUS 2 (TAT 6-24 HRS) Nasopharyngeal Nasopharyngeal Swab     Status: None   Collection Time: 08/27/19  3:42 PM   Specimen:  Nasopharyngeal Swab  Result Value Ref Range Status   SARS Coronavirus 2 NEGATIVE NEGATIVE Final    Comment: (NOTE) SARS-CoV-2 target nucleic acids are NOT DETECTED. The SARS-CoV-2 RNA is generally detectable in upper and lower respiratory specimens during the acute phase of infection. Negative results do not preclude SARS-CoV-2 infection, do not rule out co-infections with other pathogens, and should not be used as the sole basis for treatment or other patient management decisions. Negative results must be combined with clinical observations, patient history, and epidemiological information. The expected result is Negative. Fact Sheet for Patients: SugarRoll.be Fact Sheet for Healthcare Providers: https://www.woods-mathews.com/ This test is not yet approved or cleared by the [place] FDA and  has been authorized for detection and/or diagnosis of SARS-CoV-2 by FDA under an Emergency Use Authorization (EUA). This EUA will remain  in effect (meaning this test can be used) for the duration of the COVID-19 declaration under Section 56 4(b)(1) of the Act, 21 U.S.C. section 360bbb-3(b)(1), unless the authorization is terminated or revoked sooner. Performed at [facility], [place] [address] [place], [place] [address]      Studies: US Abdomen Complete  Result Date: 08/27/2019 CLINICAL DATA:  Abdominal pain and nausea for 2 weeks. EXAM: ABDOMEN ULTRASOUND COMPLETE COMPARISON:  CT abdomen and pelvis 08/25/2019. FINDINGS: Gallbladder: Removed. Common bile duct: Diameter: 1.1 cm proximally, unchanged. Liver: No focal lesion identified. Within normal limits in parenchymal echogenicity. Portal vein is patent on color Doppler imaging with normal direction of blood flow towards the liver. IVC: No abnormality visualized. Pancreas: Not visualized. Spleen: Size and appearance within normal limits. Right Kidney: Length: Approximately 10.4 cm. Echogenicity  within normal limits. No mass or hydronephrosis visualized. Left Kidney: Length: Not visualized. Abdominal aorta: No aneurysm visualized. Other findings: None. IMPRESSION: No acute abnormality or finding to explain the patient's symptoms. The pancreas and left kidney are not seen due to overlying bowel gas. Status post cholecystectomy Electronically Signed   By: Inge Rise M.D.   On: 08/27/2019 16:18   Scheduled Meds:  DULoxetine  60 mg Oral BID   pantoprazole (PROTONIX) IV  40 mg Intravenous Q12H   topiramate  50 mg Oral Daily   And   topiramate  100 mg Oral QHS   Continuous Infusions:  dextrose 5% lactated ringers 100 mL/hr at 08/28/19 0530    Active Problems:   Elevated liver enzymes  Time spent:   Irwin Brakeman, MD [facility] 08/28/2019, 12:40 PM    LOS: 0 days  How to contact the [facility] Attending or Consulting provider [place] or covering provider during after hours [place], for this patient?  1. Check the care team  in [facility] and look for a) attending/consulting TR[facility] provider listed and b) the [facility] team listed 2. Log into [email or web address] and use [facility]'s universal password to access. If you do not have the password, please contact the hospital operator. 3. Locate the [facility] provider you are looking for under [facility] and page to a number that you can be directly reached. 4. If you still have difficulty reaching the provider, please page the [facility] (Director on Call) for the Hospitalists listed on amion for assistance.

## 2019-08-29 ENCOUNTER — Inpatient Hospital Stay: Payer: Self-pay

## 2019-08-29 ENCOUNTER — Inpatient Hospital Stay (HOSPITAL_COMMUNITY): Payer: Medicaid Other

## 2019-08-29 DIAGNOSIS — F988 Other specified behavioral and emotional disorders with onset usually occurring in childhood and adolescence: Secondary | ICD-10-CM

## 2019-08-29 DIAGNOSIS — R748 Abnormal levels of other serum enzymes: Secondary | ICD-10-CM

## 2019-08-29 DIAGNOSIS — K589 Irritable bowel syndrome without diarrhea: Secondary | ICD-10-CM

## 2019-08-29 DIAGNOSIS — R740 Nonspecific elevation of levels of transaminase and lactic acid dehydrogenase [LDH]: Secondary | ICD-10-CM

## 2019-08-29 DIAGNOSIS — R55 Syncope and collapse: Secondary | ICD-10-CM

## 2019-08-29 LAB — CBC WITH DIFFERENTIAL/PLATELET
Abs Immature Granulocytes: 0.03 10*3/uL (ref 0.00–0.07)
Basophils Absolute: 0 10*3/uL (ref 0.0–0.1)
Basophils Relative: 1 %
Eosinophils Absolute: 1.1 10*3/uL — ABNORMAL HIGH (ref 0.0–0.5)
Eosinophils Relative: 12 %
HCT: 40.3 % (ref 36.0–46.0)
Hemoglobin: 13.1 g/dL (ref 12.0–15.0)
Immature Granulocytes: 0 %
Lymphocytes Relative: 30 %
Lymphs Abs: 2.6 10*3/uL (ref 0.7–4.0)
MCH: 31.4 pg (ref 26.0–34.0)
MCHC: 32.5 g/dL (ref 30.0–36.0)
MCV: 96.6 fL (ref 80.0–100.0)
Monocytes Absolute: 0.7 10*3/uL (ref 0.1–1.0)
Monocytes Relative: 8 %
Neutro Abs: 4.2 10*3/uL (ref 1.7–7.7)
Neutrophils Relative %: 49 %
Platelets: 229 10*3/uL (ref 150–400)
RBC: 4.17 MIL/uL (ref 3.87–5.11)
RDW: 13.6 % (ref 11.5–15.5)
WBC: 8.6 10*3/uL (ref 4.0–10.5)
nRBC: 0 % (ref 0.0–0.2)

## 2019-08-29 LAB — COMPREHENSIVE METABOLIC PANEL
ALT: 371 U/L — ABNORMAL HIGH (ref 0–44)
AST: 99 U/L — ABNORMAL HIGH (ref 15–41)
Albumin: 3.8 g/dL (ref 3.5–5.0)
Alkaline Phosphatase: 117 U/L (ref 38–126)
Anion gap: 5 (ref 5–15)
BUN: 9 mg/dL (ref 6–20)
CO2: 24 mmol/L (ref 22–32)
Calcium: 9 mg/dL (ref 8.9–10.3)
Chloride: 106 mmol/L (ref 98–111)
Creatinine, Ser: 0.55 mg/dL (ref 0.44–1.00)
GFR calc Af Amer: >60 mL/min (ref >60)
GFR calc non Af Amer: >60 mL/min (ref >60)
Glucose, Bld: 92 mg/dL (ref 70–99)
Potassium: 3.9 mmol/L (ref 3.5–5.1)
Sodium: 135 mmol/L (ref 135–145)
Total Bilirubin: 0.4 mg/dL (ref 0.3–1.2)
Total Protein: 6.6 g/dL (ref 6.5–8.1)

## 2019-08-29 LAB — RAPID URINE DRUG SCREEN, HOSP PERFORMED
Amphetamines: NOT DETECTED
Amphetamines: NOT DETECTED
Barbiturates: NOT DETECTED
Barbiturates: NOT DETECTED
Benzodiazepines: POSITIVE — AB
Benzodiazepines: NOT DETECTED
Cocaine: NOT DETECTED
Cocaine: NOT DETECTED
Opiates: POSITIVE — AB
Opiates: POSITIVE — AB
Tetrahydrocannabinol: POSITIVE — AB
Tetrahydrocannabinol: POSITIVE — AB

## 2019-08-29 LAB — HEPATITIS PANEL, ACUTE
HCV Ab: 0.1 s/co ratio (ref 0.0–0.9)
Hep A IgM: NEGATIVE
Hep B C IgM: NEGATIVE
Hepatitis B Surface Ag: NEGATIVE

## 2019-08-29 LAB — VITAMIN D 25 HYDROXY (VIT D DEFICIENCY, FRACTURES): Vit D, 25-Hydroxy: 18.6 ng/mL — ABNORMAL LOW (ref 30.0–100.0)

## 2019-08-29 MED ORDER — SODIUM CHLORIDE 0.9% FLUSH: 10.0000 mL | INTRAVENOUS | Status: DC | PRN

## 2019-08-29 MED ORDER — ONDANSETRON HCL 4 MG/2ML IJ SOLN
4.0000 mg | Freq: Three times a day (TID) | INTRAMUSCULAR | Status: DC
  Administered 2019-08-29: 4 mg via INTRAVENOUS
  Filled 2019-08-29: qty 2

## 2019-08-29 MED ORDER — PROMETHAZINE HCL 25 MG/ML IJ SOLN: 12.5000 mg | Freq: Four times a day (QID) | INTRAMUSCULAR | Status: DC | PRN

## 2019-08-29 MED ORDER — CHLORHEXIDINE GLUCONATE CLOTH 2 % EX PADS
6.0000 | MEDICATED_PAD | Freq: Every day | CUTANEOUS | Status: DC
  Administered 2019-08-29 – 2019-08-30 (×2): 6 via TOPICAL

## 2019-08-29 NOTE — Progress Notes (Signed)
Spoke with patient RN regarding IV access.  Currently patient has a peripheral IV in right hand and is infusing appropriately.  Contact IV team for any further IV related concerns.  Patient RN verbalized understanding.

## 2019-08-29 NOTE — Telephone Encounter (Signed)
Pt is currently admitted

## 2019-08-29 NOTE — Progress Notes (Addendum)
PROGRESS NOTE  Penny [name] Kadar ZOX:[id number] DOB: 1986-01-13 DOA: 08/27/2019 PCP: Babs Sciara, MD  Brief History:   33 y.o. female with medical history significant for PUD, pseudoseizures and seizures, gastric bypass, neuropathy, ADHD, anxiety and depression.  Patient presented to the ED with 2 weeks of upper abdominal pain, with nausea.  She also reports vomiting on a few days, once a day.  Pain is worse with meals, she has barely been eating.  No diarrhea, she reports constipation.  Abdominal pain is mostly left upper quadrant radiating, now involving her whole upper abdomen, and radiates to her back.  She denies fever or chills.  No black stools.  Cant remember her last bowel movement.  She reports compliance with her Protonix. She also reports NSAID use-800 mg up to 3 times a day, for chronic back pain and now abdominal pain. She reports abdominal pain feels different from her prior peptic ulcer disease pain.  Denies urinary symptoms.  Assessment/Plan: Intractable abdominal pain and nausea -pt having difficulty tolerating even liquids without pain -continues to have very poor po intake -08/28/19 MRCP--neg for choledocholithiasis; no focal acute hepatic abnormality -08/25/19 CT abd/pelvis--neg for acute findings -08/27/19 RUQ US--neg acute findings -start zofran around the clock -phenergan prn refractory nausea -continue PPI bid -UA neg for pyuria -continue prn IV dilaudid  Transaminasemia -hep B surface antigen neg -hep C antibody neg -suspect medication/toxin induced -new med = zanaflex ??contribuation -08/29/19--pt refused blood work -repeat LFTs am if pt allows -appreciate GI follow up  Depression/anxiety -continue cymbalta and topamax -holding adderall due to palpitations  Syncope -likely vagal due to pain/volume depletion -echo -July 2020 event monitor <1% PACs/PVC, no concerning dysrhythmia -place on tele -orthostatics -TSH 2.616, Free T4  0.79  History gastric bypass -diet per GI  Vitamin D deficiency -25 vitamin D--18.6 -supplement when able to tolerate po better  Poor IV access -PICC line placement requested        Disposition Plan:   Home when able to tolerate po intake Family Communication:   Family at bedside  Consultants:  GI--Rehman  Code Status:  FULL   DVT Prophylaxis:  SCDs   Procedures: As Listed in Progress Note Above  Antibiotics: None       Subjective: Pt states emesis is better, but continues to have nausea and abd pain with liquid intake.  Denies f/c, cp, sob.    Objective: Vitals:   08/28/19 1730 08/28/19 1957 08/28/19 2054 08/29/19 0540  BP: 106/77  131/90 100/60  Pulse: 80  92 82  Resp: 17   16  Temp: 97.8 F (36.6 C)  98.9 F (37.2 C) 98.5 F (36.9 C)  TempSrc: Oral  Oral Oral  SpO2: 94% 94% 97% 90%  Weight:      Height:        Intake/Output Summary (Last 24 hours) at 08/29/2019 1226 Last data filed at 08/29/2019 0900 Gross per 24 hour  Intake 840 ml  Output 1 ml  Net 839 ml   Weight change:  Exam:   General:  Pt is alert, follows commands appropriately, not in acute distress  HEENT: No icterus, No thrush, No neck mass, [place]/AT  Cardiovascular: RRR, S1/S2, no rubs, no gallops  Respiratory: CTA bilaterally, no wheezing, no crackles, no rhonchi  Abdomen: Soft/+BS, upper abd tender, non distended, no guarding  Extremities: No edema, No lymphangitis, No petechiae, No rashes, no synovitis   Data Reviewed: I have personally reviewed  following labs and imaging studies Basic Metabolic Panel: Recent Labs  Lab 08/23/19 2317 08/25/19 1823 08/27/19 1257 08/28/19 0655  NA 139 141 138 141  K 3.8 4.0 4.1 3.8  CL 113* 110 113* 113*  CO2 17* 20* 16* 23  GLUCOSE 101* 71 86 105*  BUN 19 11 19 7   CREATININE 0.78 0.79 0.65 0.52  CALCIUM 9.3 9.2 8.8* 8.9  MG  --   --  2.1  --    Liver Function Tests: Recent Labs  Lab 08/27/19 1257 08/28/19 0655  AST  720* 760*  ALT 368* 794*  ALKPHOS 88 143*  BILITOT 0.8 0.6  PROT 7.1 6.5  ALBUMIN 4.2 3.9   Recent Labs  Lab 08/25/19 1939 08/27/19 1257  LIPASE 32 31   No results for input(s): AMMONIA in the last 168 hours. Coagulation Profile: No results for input(s): INR, PROTIME in the last 168 hours. CBC: Recent Labs  Lab 08/23/19 2317 08/25/19 1823 08/27/19 1257 08/28/19 0655  WBC 8.6 8.6 8.0 3.7*  HGB 13.9 14.0 13.5 13.0  HCT 41.7 41.3 39.3 40.9  MCV 93.9 94.3 93.3 98.1  PLT 262 279 224 206   Cardiac Enzymes: No results for input(s): CKTOTAL, CKMB, CKMBINDEX, TROPONINI in the last 168 hours. BNP: Invalid input(s): POCBNP CBG: No results for input(s): GLUCAP in the last 168 hours. HbA1C: No results for input(s): HGBA1C in the last 72 hours. Urine analysis:    Component Value Date/Time   COLORURINE AMBER (A) 08/27/2019 1236   APPEARANCEUR HAZY (A) 08/27/2019 1236   APPEARANCEUR Cloudy (A) 03/30/2017 1600   LABSPEC 1.020 08/27/2019 1236   PHURINE 5.0 08/27/2019 1236   GLUCOSEU NEGATIVE 08/27/2019 1236   HGBUR NEGATIVE 08/27/2019 1236   BILIRUBINUR NEGATIVE 08/27/2019 1236   BILIRUBINUR negative 07/14/2019 1421   BILIRUBINUR Negative 03/30/2017 1600   KETONESUR NEGATIVE 08/27/2019 1236   PROTEINUR NEGATIVE 08/27/2019 1236   UROBILINOGEN 0.2 07/14/2019 1421   UROBILINOGEN 4.0 (H) 08/17/2015 1300   NITRITE NEGATIVE 08/27/2019 1236   LEUKOCYTESUR SMALL (A) 08/27/2019 1236   Sepsis Labs: @LABRCNTIP (procalcitonin:4,lacticidven:4) ) Recent Results (from the past 240 hour(s))  SARS CORONAVIRUS 2 (Dionel Archey 6-24 HRS) Nasopharyngeal Nasopharyngeal Swab     Status: None   Collection Time: 08/27/19  3:42 PM   Specimen: Nasopharyngeal Swab  Result Value Ref Range Status   SARS Coronavirus 2 NEGATIVE NEGATIVE Final    Comment: (NOTE) SARS-CoV-2 target nucleic acids are NOT DETECTED. The SARS-CoV-2 RNA is generally detectable in upper and lower respiratory specimens during the  acute phase of infection. Negative results do not preclude SARS-CoV-2 infection, do not rule out co-infections with other pathogens, and should not be used as the sole basis for treatment or other patient management decisions. Negative results must be combined with clinical observations, patient history, and epidemiological information. The expected result is Negative. Fact Sheet for Patients: HairSlick.no Fact Sheet for Healthcare Providers: quierodirigir.com This test is not yet approved or cleared by the [place] FDA and  has been authorized for detection and/or diagnosis of SARS-CoV-2 by FDA under an Emergency Use Authorization (EUA). This EUA will remain  in effect (meaning this test can be used) for the duration of the COVID-19 declaration under Section 56 4(b)(1) of the Act, 21 U.S.C. section 360bbb-3(b)(1), unless the authorization is terminated or revoked sooner. Performed at [facility], [address], [place], [place] [address]      Scheduled Meds:  DULoxetine  60 mg Oral BID   ondansetron (  ZOFRAN) IV  4 mg Intravenous Q8H   pantoprazole (PROTONIX) IV  40 mg Intravenous Q12H   topiramate  50 mg Oral Daily   And   topiramate  100 mg Oral QHS   Continuous Infusions:  Procedures/Studies: Dg Chest 2 View  Result Date: 08/25/2019 CLINICAL DATA:  Shortness of breath EXAM: CHEST - 2 VIEW COMPARISON:  08/23/2019 FINDINGS: The heart size and mediastinal contours are within normal limits. Both lungs are clear. The visualized skeletal structures are unremarkable. IMPRESSION: No acute abnormality of the lungs. Electronically Signed   By: Lauralyn Primes M.D.   On: 08/25/2019 18:47   US Abdomen Complete  Result Date: 08/27/2019 CLINICAL DATA:  Abdominal pain and nausea for 2 weeks. EXAM: ABDOMEN ULTRASOUND COMPLETE COMPARISON:  CT abdomen and pelvis 08/25/2019. FINDINGS: Gallbladder: Removed. Common bile duct:  Diameter: 1.1 cm proximally, unchanged. Liver: No focal lesion identified. Within normal limits in parenchymal echogenicity. Portal vein is patent on color Doppler imaging with normal direction of blood flow towards the liver. IVC: No abnormality visualized. Pancreas: Not visualized. Spleen: Size and appearance within normal limits. Right Kidney: Length: Approximately 10.4 cm. Echogenicity within normal limits. No mass or hydronephrosis visualized. Left Kidney: Length: Not visualized. Abdominal aorta: No aneurysm visualized. Other findings: None. IMPRESSION: No acute abnormality or finding to explain the patient's symptoms. The pancreas and left kidney are not seen due to overlying bowel gas. Status post cholecystectomy Electronically Signed   By: Drusilla Kanner M.D.   On: 08/27/2019 16:18   Ct Abdomen Pelvis W Contrast  Result Date: 08/25/2019 CLINICAL DATA:  Abdominal pain. Hernia. EXAM: CT ABDOMEN AND PELVIS WITH CONTRAST TECHNIQUE: Multidetector CT imaging of the abdomen and pelvis was performed using the standard protocol following bolus administration of intravenous contrast. CONTRAST:  OMNIPAQUE IOHEXOL 300 MG/ML  SOLN COMPARISON:  December 18, 2017. FINDINGS: Lower chest: The lung bases are clear. The heart size is normal. Hepatobiliary: The liver is normal. Status post cholecystectomy.There is no biliary ductal dilation. Pancreas: Normal contours without ductal dilatation. No peripancreatic fluid collection. Spleen: No splenic laceration or hematoma. Adrenals/Urinary Tract: --Adrenal glands: No adrenal hemorrhage. --Right kidney/ureter: No hydronephrosis or perinephric hematoma. --Left kidney/ureter: No hydronephrosis or perinephric hematoma. --Urinary bladder: Unremarkable. Stomach/Bowel: --Stomach/Duodenum: The patient is status post prior gastric bypass. --Small bowel: No dilatation or inflammation. --Colon: No focal abnormality. --Appendix: Normal. Vascular/Lymphatic: Normal course and  caliber of the major abdominal vessels. --No retroperitoneal lymphadenopathy. --there are mildly prominent mesenteric lymph nodes. --No pelvic or inguinal lymphadenopathy. Reproductive: Unremarkable Other: No ascites or free air. The abdominal wall is normal. Musculoskeletal. The patient is status post prior vertebral augmentation of the T12 vertebral body. IMPRESSION: No acute abdominopelvic abnormality. Electronically Signed   By: Katherine Mantle M.D.   On: 08/25/2019 20:57   Mr Abdomen Mrcp Wo Contrast  Result Date: 08/28/2019 CLINICAL DATA:  Abdominal pain with elevated liver function studies. History of cholecystectomy in 2005 and previous gastric bypass. EXAM: MRI ABDOMEN WITHOUT CONTRAST  (INCLUDING MRCP) TECHNIQUE: Multiplanar multisequence MR imaging of the abdomen was performed. Heavily T2-weighted images of the biliary and pancreatic ducts were obtained, and three-dimensional MRCP images were rendered by post processing. COMPARISON:  Abdominopelvic CT 08/25/2019 and 12/18/2017. Abdominal ultrasound 08/27/2019. FINDINGS: Despite efforts by the technologist and patient, mild motion artifact is present on today's exam and could not be eliminated. This reduces exam sensitivity and specificity. Lower chest:  The visualized lower chest appears unremarkable. Hepatobiliary: The liver appears normal in signal without  focal lesion or significant steatosis. Status post cholecystectomy. There is mild intra and extrahepatic biliary dilatation, similar to previous studies. The common hepatic duct measures 11 mm in diameter. There is a mildly dilated cystic duct remnant. No evidence of choledocholithiasis. Pancreas: Unremarkable. No pancreatic ductal dilatation or surrounding inflammatory changes. Spleen: Normal in size without focal abnormality. Adrenals/Urinary Tract: Both adrenal glands appear normal. The kidneys and ureters appear normal. Stomach/Bowel: No evidence of bowel wall thickening, distention or  surrounding inflammatory change.Stable postoperative changes in the left upper quadrant consistent with previous gastric bypass. Vascular/Lymphatic: There are no enlarged abdominal lymph nodes. No significant vascular findings are present. Other: The visualized abdominal wall is intact. No ascites. Mild dependent subcutaneous edema in the back. Musculoskeletal: Status post T12 spinal augmentation. No acute osseous findings. IMPRESSION: 1. Stable mild biliary dilatation post cholecystectomy, without evidence of choledocholithiasis, likely physiologic. 2. No acute or focal hepatic abnormalities identified. 3. Previous gastric bypass. Electronically Signed   By: Carey Bullocks M.D.   On: 08/28/2019 14:17   Mr 3d Recon At Scanner  Result Date: 08/28/2019 CLINICAL DATA:  Abdominal pain with elevated liver function studies. History of cholecystectomy in 2005 and previous gastric bypass. EXAM: MRI ABDOMEN WITHOUT CONTRAST  (INCLUDING MRCP) TECHNIQUE: Multiplanar multisequence MR imaging of the abdomen was performed. Heavily T2-weighted images of the biliary and pancreatic ducts were obtained, and three-dimensional MRCP images were rendered by post processing. COMPARISON:  Abdominopelvic CT 08/25/2019 and 12/18/2017. Abdominal ultrasound 08/27/2019. FINDINGS: Despite efforts by the technologist and patient, mild motion artifact is present on today's exam and could not be eliminated. This reduces exam sensitivity and specificity. Lower chest:  The visualized lower chest appears unremarkable. Hepatobiliary: The liver appears normal in signal without focal lesion or significant steatosis. Status post cholecystectomy. There is mild intra and extrahepatic biliary dilatation, similar to previous studies. The common hepatic duct measures 11 mm in diameter. There is a mildly dilated cystic duct remnant. No evidence of choledocholithiasis. Pancreas: Unremarkable. No pancreatic ductal dilatation or surrounding inflammatory  changes. Spleen: Normal in size without focal abnormality. Adrenals/Urinary Tract: Both adrenal glands appear normal. The kidneys and ureters appear normal. Stomach/Bowel: No evidence of bowel wall thickening, distention or surrounding inflammatory change.Stable postoperative changes in the left upper quadrant consistent with previous gastric bypass. Vascular/Lymphatic: There are no enlarged abdominal lymph nodes. No significant vascular findings are present. Other: The visualized abdominal wall is intact. No ascites. Mild dependent subcutaneous edema in the back. Musculoskeletal: Status post T12 spinal augmentation. No acute osseous findings. IMPRESSION: 1. Stable mild biliary dilatation post cholecystectomy, without evidence of choledocholithiasis, likely physiologic. 2. No acute or focal hepatic abnormalities identified. 3. Previous gastric bypass. Electronically Signed   By: Carey Bullocks M.D.   On: 08/28/2019 14:17   Dg Chest Portable 1 View  Result Date: 08/23/2019 CLINICAL DATA:  Shortness of breath EXAM: PORTABLE CHEST 1 VIEW COMPARISON:  08/16/2019 FINDINGS: The heart size and mediastinal contours are within normal limits. Both lungs are clear. The visualized skeletal structures are unremarkable. Mild patient rotation to the right accentuates the mediastinal markings. IMPRESSION: No active disease. Electronically Signed   By: Alcide Clever M.D.   On: 08/23/2019 20:58   Dg Chest Portable 1 View  Result Date: 08/16/2019 CLINICAL DATA:  Shortness of breath, chest pain EXAM: PORTABLE CHEST 1 VIEW COMPARISON:  04/21/2019 FINDINGS: The heart size and mediastinal contours are within normal limits. Both lungs are clear. The visualized skeletal structures are unremarkable. IMPRESSION: No acute abnormality  of the lungs in AP portable projection. Electronically Signed   By: Lauralyn Primes M.D.   On: 08/16/2019 21:17   [name] Ekg Site Rite  Result Date: 08/29/2019 If Site Rite image not attached, placement could  not be confirmed due to current cardiac rhythm.   Catarina Hartshorn, DO  [facility] Pager [phone]  If 7PM-7AM, please contact night-coverage [email or web address] Password [email or web address] 08/29/2019, 12:26 PM   LOS: 1 day

## 2019-08-29 NOTE — Progress Notes (Signed)
CC:  Abdominal pain, elevated LFTs  Subjective: She complains of feeling short of breath. Increased nausea last night, no vomiting.  She continues to have left upper abdominal pain which radiates radiates across to the right upper abdomen.  She has tolerated the fourth portion of her oatmeal and she drinks 120 cc of apple juice.  A.m. labs were not drawn due to poor venous access.  A PICC line to be placed later today.  Objective:  Vital signs in last 24 hours: Temp:  [97.8 F (36.6 C)-98.9 F (37.2 C)] 98.5 F (36.9 C) (09/23 0540) Pulse Rate:  [80-92] 82 (09/23 0540) Resp:  [16-17] 16 (09/23 0540) BP: (100-131)/(60-90) 100/60 (09/23 0540) SpO2:  [90 %-98 %] 90 % (09/23 0540) Last BM Date: (unknown) General: Alert but fatigued appearing no acute distress Eyes: No scleral icterus, conjunctiva pink Heart: Regular rate and rhythm, no murmurs Pulm: Scattered inspiratory wheezes throughout Abdomen: Soft, moderate tenderness left upper quadrant and epigastric area, less tenderness to the central lower abdomen without rebound or guarding, positive bowel sounds to all 4 quadrants, no HSM Extremities:  Without edema. Neurologic:  Alert and  oriented x4;  grossly normal neurologically. Psych:  Alert and cooperative. Normal mood and affect.  Intake/Output from previous day: 09/22 0701 - 09/23 0700 In: 600 [P.O.:600] Out: 1 [Urine:1] Intake/Output this shift: No intake/output data recorded.  Lab Results: Recent Labs    08/27/19 1257 08/28/19 0655  WBC 8.0 3.7*  HGB 13.5 13.0  HCT 39.3 40.9  PLT 224 206   BMET Recent Labs    08/27/19 1257 08/28/19 0655  NA 138 141  K 4.1 3.8  CL 113* 113*  CO2 16* 23  GLUCOSE 86 105*  BUN 19 7  CREATININE 0.65 0.52  CALCIUM 8.8* 8.9   LFT Recent Labs    08/28/19 0655  PROT 6.5  ALBUMIN 3.9  AST 760*  ALT 794*  ALKPHOS 143*  BILITOT 0.6   PT/INR No results for input(s): LABPROT, INR in the last 72 hours. Hepatitis Panel Recent  Labs    08/27/19 2111  HEPBSAG Negative  HCVAB 0.1  HEPAIGM Negative  HEPBIGM Negative    US Abdomen Complete  Result Date: 08/27/2019 CLINICAL DATA:  Abdominal pain and nausea for 2 weeks. EXAM: ABDOMEN ULTRASOUND COMPLETE COMPARISON:  CT abdomen and pelvis 08/25/2019. FINDINGS: Gallbladder: Removed. Common bile duct: Diameter: 1.1 cm proximally, unchanged. Liver: No focal lesion identified. Within normal limits in parenchymal echogenicity. Portal vein is patent on color Doppler imaging with normal direction of blood flow towards the liver. IVC: No abnormality visualized. Pancreas: Not visualized. Spleen: Size and appearance within normal limits. Right Kidney: Length: Approximately 10.4 cm. Echogenicity within normal limits. No mass or hydronephrosis visualized. Left Kidney: Length: Not visualized. Abdominal aorta: No aneurysm visualized. Other findings: None. IMPRESSION: No acute abnormality or finding to explain the patient's symptoms. The pancreas and left kidney are not seen due to overlying bowel gas. Status post cholecystectomy Electronically Signed   By: Inge Rise M.D.   On: 08/27/2019 16:18   Mr Abdomen Mrcp Wo Contrast  Result Date: 08/28/2019 CLINICAL DATA:  Abdominal pain with elevated liver function studies. History of cholecystectomy in 2005 and previous gastric bypass. EXAM: MRI ABDOMEN WITHOUT CONTRAST  (INCLUDING MRCP) TECHNIQUE: Multiplanar multisequence MR imaging of the abdomen was performed. Heavily T2-weighted images of the biliary and pancreatic ducts were obtained, and three-dimensional MRCP images were rendered by post processing. COMPARISON:  Abdominopelvic CT 08/25/2019 and 12/18/2017. Abdominal  ultrasound 08/27/2019. FINDINGS: Despite efforts by the technologist and patient, mild motion artifact is present on today's exam and could not be eliminated. This reduces exam sensitivity and specificity. Lower chest:  The visualized lower chest appears unremarkable.  Hepatobiliary: The liver appears normal in signal without focal lesion or significant steatosis. Status post cholecystectomy. There is mild intra and extrahepatic biliary dilatation, similar to previous studies. The common hepatic duct measures 11 mm in diameter. There is a mildly dilated cystic duct remnant. No evidence of choledocholithiasis. Pancreas: Unremarkable. No pancreatic ductal dilatation or surrounding inflammatory changes. Spleen: Normal in size without focal abnormality. Adrenals/Urinary Tract: Both adrenal glands appear normal. The kidneys and ureters appear normal. Stomach/Bowel: No evidence of bowel wall thickening, distention or surrounding inflammatory change.Stable postoperative changes in the left upper quadrant consistent with previous gastric bypass. Vascular/Lymphatic: There are no enlarged abdominal lymph nodes. No significant vascular findings are present. Other: The visualized abdominal wall is intact. No ascites. Mild dependent subcutaneous edema in the back. Musculoskeletal: Status post T12 spinal augmentation. No acute osseous findings. IMPRESSION: 1. Stable mild biliary dilatation post cholecystectomy, without evidence of choledocholithiasis, likely physiologic. 2. No acute or focal hepatic abnormalities identified. 3. Previous gastric bypass. Electronically Signed   By: Richardean Sale M.D.   On: 08/28/2019 14:17   Mr 3d Recon At Scanner  Result Date: 08/28/2019 CLINICAL DATA:  Abdominal pain with elevated liver function studies. History of cholecystectomy in 2005 and previous gastric bypass. EXAM: MRI ABDOMEN WITHOUT CONTRAST  (INCLUDING MRCP) TECHNIQUE: Multiplanar multisequence MR imaging of the abdomen was performed. Heavily T2-weighted images of the biliary and pancreatic ducts were obtained, and three-dimensional MRCP images were rendered by post processing. COMPARISON:  Abdominopelvic CT 08/25/2019 and 12/18/2017. Abdominal ultrasound 08/27/2019. FINDINGS: Despite efforts  by the technologist and patient, mild motion artifact is present on today's exam and could not be eliminated. This reduces exam sensitivity and specificity. Lower chest:  The visualized lower chest appears unremarkable. Hepatobiliary: The liver appears normal in signal without focal lesion or significant steatosis. Status post cholecystectomy. There is mild intra and extrahepatic biliary dilatation, similar to previous studies. The common hepatic duct measures 11 mm in diameter. There is a mildly dilated cystic duct remnant. No evidence of choledocholithiasis. Pancreas: Unremarkable. No pancreatic ductal dilatation or surrounding inflammatory changes. Spleen: Normal in size without focal abnormality. Adrenals/Urinary Tract: Both adrenal glands appear normal. The kidneys and ureters appear normal. Stomach/Bowel: No evidence of bowel wall thickening, distention or surrounding inflammatory change.Stable postoperative changes in the left upper quadrant consistent with previous gastric bypass. Vascular/Lymphatic: There are no enlarged abdominal lymph nodes. No significant vascular findings are present. Other: The visualized abdominal wall is intact. No ascites. Mild dependent subcutaneous edema in the back. Musculoskeletal: Status post T12 spinal augmentation. No acute osseous findings. IMPRESSION: 1. Stable mild biliary dilatation post cholecystectomy, without evidence of choledocholithiasis, likely physiologic. 2. No acute or focal hepatic abnormalities identified. 3. Previous gastric bypass. Electronically Signed   By: Richardean Sale M.D.   On: 08/28/2019 14:17    Assessment / Plan:  1. Transaminitis. Stable biliary dilation without evidence of choledocholithiasis per MRCP  9/22 Alk phos 143. AST 760. ALT 794.  Acute hepatitis panel 9/21 was negative.  Today's labs not yet completed due to poor venous access -am labs to be drawn after PICC line placed, await results -patient will most likely require IR  percutaneous approach for biliary duct evaluation  2.  Upper abdominal pain with nausea -pain management per  hospitalist -follow daily hepatic panel and CBC  -diet as tolerated  -Zofran as needed  3. Iron levels elevated.  Ferritin level is elevated.  Family history of hemochromatosis.  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  Hemochromatosis does not typically present as acute hepatitis. -Plan to repeat fasting iron studies when transaminase levels normalize  4. Syncope,  Chest pain, tachycardia -Recommend echo as inpatient  5.  Shortness of breath.  Patient has inspiratory wheezes scattered throughout her right and left lung fields this morning. -Defer chest x-ray versus chest CT and nebulizer treatment per hospitalist  Recommendations per Dr. Melony Overly later today     LOS: 1 day   Noralyn Pick  08/29/2019, 7:49 AM

## 2019-08-29 NOTE — Progress Notes (Signed)
*  PRELIMINARY RESULTS* Echocardiogram 2D Echocardiogram has been performed.  Leavy Cella 08/29/2019, 4:35 PM

## 2019-08-29 NOTE — Progress Notes (Signed)
Peripherally Inserted Central Catheter/Midline Placement  The IV Nurse has discussed with the patient and/or persons authorized to consent for the patient, the purpose of this procedure and the potential benefits and risks involved with this procedure.  The benefits include less needle sticks, lab draws from the catheter, and the patient may be discharged home with the catheter. Risks include, but not limited to, infection, bleeding, blood clot (thrombus formation), and puncture of an artery; nerve damage and irregular heartbeat and possibility to perform a PICC exchange if needed/ordered by physician.  Alternatives to this procedure were also discussed.  Bard Power PICC patient education guide, fact sheet on infection prevention and patient information card has been provided to patient /or left at bedside.    PICC/Midline Placement Documentation  PICC Single Lumen 0000000 PICC Right Basilic 39 cm 0 cm (Active)  Indication for Insertion or Continuance of Line Poor Vasculature-patient has had multiple peripheral attempts or PIVs lasting less than 24 hours 08/29/19 1700  Exposed Catheter (cm) 0 cm 08/29/19 1700  Site Assessment Clean;Dry;Intact 08/29/19 1700  Line Status Flushed;Blood return noted;Saline locked 08/29/19 1700  Dressing Type Transparent 08/29/19 1700  Dressing Status Clean;Dry;Intact;Antimicrobial disc in place 08/29/19 1700  Dressing Change Due 09/05/19 08/29/19 1700       Scotty Court 08/29/2019, 5:01 PM

## 2019-08-30 ENCOUNTER — Inpatient Hospital Stay (HOSPITAL_COMMUNITY): Payer: Medicaid Other

## 2019-08-30 DIAGNOSIS — F1210 Cannabis abuse, uncomplicated: Secondary | ICD-10-CM

## 2019-08-30 DIAGNOSIS — R0600 Dyspnea, unspecified: Secondary | ICD-10-CM

## 2019-08-30 DIAGNOSIS — R0609 Other forms of dyspnea: Secondary | ICD-10-CM

## 2019-08-30 LAB — ECHOCARDIOGRAM COMPLETE
Height: 66 in
Weight: 3200 oz

## 2019-08-30 LAB — LIPASE, BLOOD: Lipase: 25 U/L (ref 11–51)

## 2019-08-30 LAB — COMPREHENSIVE METABOLIC PANEL
ALT: 289 U/L — ABNORMAL HIGH (ref 0–44)
AST: 58 U/L — ABNORMAL HIGH (ref 15–41)
Albumin: 3.5 g/dL (ref 3.5–5.0)
Alkaline Phosphatase: 102 U/L (ref 38–126)
Anion gap: 6 (ref 5–15)
BUN: 9 mg/dL (ref 6–20)
CO2: 23 mmol/L (ref 22–32)
Calcium: 8.6 mg/dL — ABNORMAL LOW (ref 8.9–10.3)
Chloride: 108 mmol/L (ref 98–111)
Creatinine, Ser: 0.6 mg/dL (ref 0.44–1.00)
GFR calc Af Amer: >60 mL/min (ref >60)
GFR calc non Af Amer: >60 mL/min (ref >60)
Glucose, Bld: 94 mg/dL (ref 70–99)
Potassium: 3.7 mmol/L (ref 3.5–5.1)
Sodium: 137 mmol/L (ref 135–145)
Total Bilirubin: 0.4 mg/dL (ref 0.3–1.2)
Total Protein: 6.1 g/dL — ABNORMAL LOW (ref 6.5–8.1)

## 2019-08-30 LAB — CBC WITH DIFFERENTIAL/PLATELET
Abs Immature Granulocytes: 0.02 10*3/uL (ref 0.00–0.07)
Basophils Absolute: 0 10*3/uL (ref 0.0–0.1)
Basophils Relative: 0 %
Eosinophils Absolute: 1 10*3/uL — ABNORMAL HIGH (ref 0.0–0.5)
Eosinophils Relative: 13 %
HCT: 38.1 % (ref 36.0–46.0)
Hemoglobin: 12.5 g/dL (ref 12.0–15.0)
Immature Granulocytes: 0 %
Lymphocytes Relative: 33 %
Lymphs Abs: 2.4 10*3/uL (ref 0.7–4.0)
MCH: 31.9 pg (ref 26.0–34.0)
MCHC: 32.8 g/dL (ref 30.0–36.0)
MCV: 97.2 fL (ref 80.0–100.0)
Monocytes Absolute: 0.7 10*3/uL (ref 0.1–1.0)
Monocytes Relative: 9 %
Neutro Abs: 3.3 10*3/uL (ref 1.7–7.7)
Neutrophils Relative %: 45 %
Platelets: 220 10*3/uL (ref 150–400)
RBC: 3.92 MIL/uL (ref 3.87–5.11)
RDW: 13.5 % (ref 11.5–15.5)
WBC: 7.4 10*3/uL (ref 4.0–10.5)
nRBC: 0 % (ref 0.0–0.2)

## 2019-08-30 LAB — PROTIME-INR
INR: 0.9 (ref 0.8–1.2)
Prothrombin Time: 12.5 seconds (ref 11.4–15.2)

## 2019-08-30 LAB — TROPONIN I (HIGH SENSITIVITY)
Troponin I (High Sensitivity): <2 ng/L (ref <18)
Troponin I (High Sensitivity): <2 ng/L (ref <18)

## 2019-08-30 LAB — D-DIMER, QUANTITATIVE: D-Dimer, Quant: <0.27 ug/mL-FEU (ref 0.00–0.50)

## 2019-08-30 LAB — GAMMA GT: GGT: 101 U/L — ABNORMAL HIGH (ref 7–50)

## 2019-08-30 MED ORDER — POTASSIUM CHLORIDE IN NACL 20-0.9 MEQ/L-% IV SOLN
INTRAVENOUS | Status: DC
  Administered 2019-08-30 – 2019-08-31 (×2): via INTRAVENOUS

## 2019-08-30 MED ORDER — LEVALBUTEROL HCL 0.63 MG/3ML IN NEBU
0.6300 mg | INHALATION_SOLUTION | Freq: Four times a day (QID) | RESPIRATORY_TRACT | Status: DC
  Administered 2019-08-30 – 2019-08-31 (×3): 0.63 mg via RESPIRATORY_TRACT
  Filled 2019-08-30 (×4): qty 3

## 2019-08-30 MED ORDER — CLONAZEPAM 0.5 MG PO TABS
0.5000 mg | ORAL_TABLET | Freq: Three times a day (TID) | ORAL | Status: DC
  Administered 2019-08-30 – 2019-08-31 (×5): 0.5 mg via ORAL
  Filled 2019-08-30 (×5): qty 1

## 2019-08-30 NOTE — Progress Notes (Signed)
Subjective:  Patient remains with multiple symptoms.  She complains of intermittent shortness of breath.  As I walked in she just had a portable x-ray.  She remains with nausea and vomiting after eating.  Her appetite is poor.  She continues to complain of upper abdominal pain.  Objective: Blood pressure 98/87, pulse (!) 122, temperature 98.3 F (36.8 C), temperature source Oral, resp. rate 19, height '5\' 6"'$  (1.676 m), weight 90.7 kg, SpO2 100 %. Patient appears anxious but she is not in respiratory distress. Abdomen is full but soft with mild midepigastric and right upper quadrant tenderness.  No organomegaly or masses. No LE edema noted.  Labs/studies Results:  CBC Latest Ref Rng & Units 08/30/2019 08/29/2019 08/28/2019  WBC 4.0 - 10.5 K/uL 7.4 8.6 3.7(L)  Hemoglobin 12.0 - 15.0 g/dL 12.5 13.1 13.0  Hematocrit 36.0 - 46.0 % 38.1 40.3 40.9  Platelets 150 - 400 K/uL 220 229 206    CMP Latest Ref Rng & Units 08/30/2019 08/29/2019 08/28/2019  Glucose 70 - 99 mg/dL 94 92 105(H)  BUN 6 - 20 mg/dL '9 9 7  '$ Creatinine 0.44 - 1.00 mg/dL 0.60 0.55 0.52  Sodium 135 - 145 mmol/L 137 135 141  Potassium 3.5 - 5.1 mmol/L 3.7 3.9 3.8  Chloride 98 - 111 mmol/L 108 106 113(H)  CO2 22 - 32 mmol/L '23 24 23  '$ Calcium 8.9 - 10.3 mg/dL 8.6(L) 9.0 8.9  Total Protein 6.5 - 8.1 g/dL 6.1(L) 6.6 6.5  Total Bilirubin 0.3 - 1.2 mg/dL 0.4 0.4 0.6  Alkaline Phos 38 - 126 U/L 102 117 143(H)  AST 15 - 41 U/L 58(H) 99(H) 760(H)  ALT 0 - 44 U/L 289(H) 371(H) 794(H)    Hepatic Function Latest Ref Rng & Units 08/30/2019 08/29/2019 08/28/2019  Total Protein 6.5 - 8.1 g/dL 6.1(L) 6.6 6.5  Albumin 3.5 - 5.0 g/dL 3.5 3.8 3.9  AST 15 - 41 U/L 58(H) 99(H) 760(H)  ALT 0 - 44 U/L 289(H) 371(H) 794(H)  Alk Phosphatase 38 - 126 U/L 102 117 143(H)  Total Bilirubin 0.3 - 1.2 mg/dL 0.4 0.4 0.6  Bilirubin, Direct <=0.2 mg/dL - - -    Lab Results  Component Value Date   CRP <0.5 11/27/2015      Assessment:  #1.  Elevated  transaminases.  Significant drop in transaminases in the last 24 hours.  2 days ago AST was 760 and this morning is 58.  ALT is also coming down.  Intermittent well documented bump in her transaminases appears to be either due to microlithiasis or SOD dysfunction.  If 1 reviews her LFTs over the last 5 years this pattern is not consistent with chronic liver disease.  Given history of bariatric surgery and long afferent loop ERCP may be difficult if not impossible.  Since transaminases are coming down she could be further evaluated as an outpatient.  Will refer her to Dr. Gayleen Orem of [facility] biliary service.  #2.  Abdominal pain with nausea and vomiting.  She has well-documented anastomotic ulcers.  She had been taking ibuprofen prior to this admission.  It would be reasonable to reevaluate upper GI tract to make sure she has not developed progression of the disease or stricture.  Last EGD was in May 2020.  #3.  Intermittent dyspnea and history of syncope.  No abnormality noted on echocardiogram and chest film does not show any acute abnormality.  Official reading is pending  #4.  Chronic pain.  Chronic pain needs to be  managed at the pain clinic.  Unfortunately she is not a candidate for long-term NSAID therapy.   Recommendations  Esophagogastroduodenoscopy under monitored anesthesia care on 08/31/2019.

## 2019-08-30 NOTE — Progress Notes (Signed)
PROGRESS NOTE  Penny [name] UJW:[id number] DOB: [birth year] DOA: 08/27/2019 PCP: Babs Sciara, MD  Brief History:  33 y.o.femalewith medical history significant forPUD,pseudoseizures and seizures, gastric bypass, neuropathy, ADHD, anxiety and depression.Patient presented to the ED with 2 weeks of upper abdominal pain, with nausea. She also reports vomiting on a few days, once a day. Pain is worse with meals,she has barely been eating. No diarrhea, she reports constipation. Abdominal pain is mostly left upper quadrant radiating,now involving her whole upper abdomen, and radiates to her back.She denies fever or chills. No black stools. Cant remember her last bowel movement. She reports compliance with her Protonix. She alsoreports NSAID use-800 mgup to3 times a day,for chronic back pain and now abdominal pain. She reports abdominal painfeelsdifferent from her prior peptic ulcer disease pain. Denies urinary symptoms.  Assessment/Plan: Intractable abdominal pain and nausea -pt having difficulty tolerating even liquids without pain -continues to have very poor po intake -08/28/19 MRCP--neg for choledocholithiasis; no focal acute hepatic abnormality -08/25/19 CT abd/pelvis--neg for acute findings -08/27/19 RUQ US--neg acute findings -phenergan prn refractory nausea -continue PPI bid -UA neg for pyuria -continue prn IV dilaudid  Syncope -likely vagal due to pain/volume depletion -echo--EF 50-55%, normal RV; trivial TR; no ASD -July 2020 event monitor <1% PACs/PVC, no concerning dysrhythmia -place on tele -orthostatics--positive by pulse -start IVF -TSH 2.616, Free T4 0.79  Bronchospasm -pt having intermittent wheezing -08/25/19 CXR neg -repeat CXR today -start pulmicort and albuterol  Atypical chest discomfort -suspect GI etiology -cycle troponin -EKG -D-Dimer -no concerning dysrhythmia on telemetry  Transaminasemia -hep B surface  antigen neg -hep C antibody neg -suspect medication/toxin induced -new med = zanaflex ??contribuation -08/29/19--pt refused blood work -repeat LFTs--trending down -appreciate GI follow up  THC use/abuse -?contributing to elevated LFTs  Depression/anxiety -continue cymbalta and topamax -holding adderall due to palpitations  History gastric bypass -diet per GI  Vitamin D deficiency -25 vitamin D--18.6 -supplement when able to tolerate po better  Poor IV access -PICC line placement 08/29/19        Disposition Plan:   Home when able to tolerate po intake and cleared by GI Family Communication:  No Family at bedside  Consultants:  GI--Rehman  Code Status:  FULL   DVT Prophylaxis:  SCDs   Procedures: As Listed in Progress Note Above  Antibiotics: None    Subjective: Pt complains of sob and wheeze.  Denies vomiting but complains of nausea and upper abd pain.  Denies f/c, dysuria, neck pain.  Has some intermittent chest discomfort  Objective: Vitals:   08/30/19 0517 08/30/19 1304 08/30/19 1308 08/30/19 1310  BP: 108/72 95/68 100/78 98/87  Pulse: 90 91 (!) 46 (!) 122  Resp: 16 18 19 19   Temp: 97.9 F (36.6 C) 98.3 F (36.8 C)    TempSrc: Oral Oral    SpO2: 94% 93% (!) 88% 100%  Weight:      Height:        Intake/Output Summary (Last 24 hours) at 08/30/2019 1405 Last data filed at 08/30/2019 0844 Gross per 24 hour  Intake 720 ml  Output --  Net 720 ml   Weight change:  Exam:   General:  Pt is alert, follows commands appropriately, not in acute distress  HEENT: No icterus, No thrush, No neck mass, [place]/AT  Cardiovascular: RRR, S1/S2, no rubs, no gallops  Respiratory: scattered bilateral rales and bilateral wheeze  Abdomen: Soft/+BS, non tender, non distended, no guarding  Extremities: No edema, No lymphangitis, No petechiae, No rashes, no synovitis   Data Reviewed: I have personally reviewed following labs and imaging  studies Basic Metabolic Panel: Recent Labs  Lab 08/25/19 1823 08/27/19 1257 08/28/19 0655 08/29/19 1858 08/30/19 0511  NA 141 138 141 135 137  K 4.0 4.1 3.8 3.9 3.7  CL 110 113* 113* 106 108  CO2 20* 16* 23 24 23   GLUCOSE 71 86 105* 92 94  BUN 11 19 7 9 9   CREATININE 0.79 0.65 0.52 0.55 0.60  CALCIUM 9.2 8.8* 8.9 9.0 8.6*  MG  --  2.1  --   --   --    Liver Function Tests: Recent Labs  Lab 08/27/19 1257 08/28/19 0655 08/29/19 1858 08/30/19 0511  AST 720* 760* 99* 58*  ALT 368* 794* 371* 289*  ALKPHOS 88 143* 117 102  BILITOT 0.8 0.6 0.4 0.4  PROT 7.1 6.5 6.6 6.1*  ALBUMIN 4.2 3.9 3.8 3.5   Recent Labs  Lab 08/25/19 1939 08/27/19 1257 08/30/19 0511  LIPASE 32 31 25   No results for input(s): AMMONIA in the last 168 hours. Coagulation Profile: Recent Labs  Lab 08/30/19 0511  INR 0.9   CBC: Recent Labs  Lab 08/25/19 1823 08/27/19 1257 08/28/19 0655 08/29/19 1858 08/30/19 0511  WBC 8.6 8.0 3.7* 8.6 7.4  NEUTROABS  --   --   --  4.2 3.3  HGB 14.0 13.5 13.0 13.1 12.5  HCT 41.3 39.3 40.9 40.3 38.1  MCV 94.3 93.3 98.1 96.6 97.2  PLT 279 224 206 229 220   Cardiac Enzymes: No results for input(s): CKTOTAL, CKMB, CKMBINDEX, TROPONINI in the last 168 hours. BNP: Invalid input(s): POCBNP CBG: No results for input(s): GLUCAP in the last 168 hours. HbA1C: No results for input(s): HGBA1C in the last 72 hours. Urine analysis:    Component Value Date/Time   COLORURINE AMBER (A) 08/27/2019 1236   APPEARANCEUR HAZY (A) 08/27/2019 1236   APPEARANCEUR Cloudy (A) 03/30/2017 1600   LABSPEC 1.020 08/27/2019 1236   PHURINE 5.0 08/27/2019 1236   GLUCOSEU NEGATIVE 08/27/2019 1236   HGBUR NEGATIVE 08/27/2019 1236   BILIRUBINUR NEGATIVE 08/27/2019 1236   BILIRUBINUR negative 07/14/2019 1421   BILIRUBINUR Negative 03/30/2017 1600   KETONESUR NEGATIVE 08/27/2019 1236   PROTEINUR NEGATIVE 08/27/2019 1236   UROBILINOGEN 0.2 07/14/2019 1421   UROBILINOGEN 4.0 (H)  08/17/2015 1300   NITRITE NEGATIVE 08/27/2019 1236   LEUKOCYTESUR SMALL (A) 08/27/2019 1236   Sepsis Labs: @LABRCNTIP (procalcitonin:4,lacticidven:4) ) Recent Results (from the past 240 hour(s))  SARS CORONAVIRUS 2 (Cheray Pardi 6-24 HRS) Nasopharyngeal Nasopharyngeal Swab     Status: None   Collection Time: 08/27/19  3:42 PM   Specimen: Nasopharyngeal Swab  Result Value Ref Range Status   SARS Coronavirus 2 NEGATIVE NEGATIVE Final    Comment: (NOTE) SARS-CoV-2 target nucleic acids are NOT DETECTED. The SARS-CoV-2 RNA is generally detectable in upper and lower respiratory specimens during the acute phase of infection. Negative results do not preclude SARS-CoV-2 infection, do not rule out co-infections with other pathogens, and should not be used as the sole basis for treatment or other patient management decisions. Negative results must be combined with clinical observations, patient history, and epidemiological information. The expected result is Negative. Fact Sheet for Patients: HairSlick.no Fact Sheet for Healthcare Providers: quierodirigir.com This test is not yet approved or cleared by the [place] FDA and  has been authorized for detection and/or diagnosis of SARS-CoV-2 by FDA under an Emergency Use  Authorization (EUA). This EUA will remain  in effect (meaning this test can be used) for the duration of the COVID-19 declaration under Section 56 4(b)(1) of the Act, 21 U.S.C. section 360bbb-3(b)(1), unless the authorization is terminated or revoked sooner. Performed at [facility], [address], [place], [place] [address]      Scheduled Meds:  Chlorhexidine Gluconate Cloth  6 each Topical Daily   clonazePAM  0.5 mg Oral TID   DULoxetine  60 mg Oral BID   pantoprazole (PROTONIX) IV  40 mg Intravenous Q12H   topiramate  50 mg Oral Daily   And   topiramate  100 mg Oral QHS   Continuous  Infusions:  Procedures/Studies: Dg Chest 2 View  Result Date: 08/25/2019 CLINICAL DATA:  Shortness of breath EXAM: CHEST - 2 VIEW COMPARISON:  08/23/2019 FINDINGS: The heart size and mediastinal contours are within normal limits. Both lungs are clear. The visualized skeletal structures are unremarkable. IMPRESSION: No acute abnormality of the lungs. Electronically Signed   By: Lauralyn Primes M.D.   On: 08/25/2019 18:47   US Abdomen Complete  Result Date: 08/27/2019 CLINICAL DATA:  Abdominal pain and nausea for 2 weeks. EXAM: ABDOMEN ULTRASOUND COMPLETE COMPARISON:  CT abdomen and pelvis 08/25/2019. FINDINGS: Gallbladder: Removed. Common bile duct: Diameter: 1.1 cm proximally, unchanged. Liver: No focal lesion identified. Within normal limits in parenchymal echogenicity. Portal vein is patent on color Doppler imaging with normal direction of blood flow towards the liver. IVC: No abnormality visualized. Pancreas: Not visualized. Spleen: Size and appearance within normal limits. Right Kidney: Length: Approximately 10.4 cm. Echogenicity within normal limits. No mass or hydronephrosis visualized. Left Kidney: Length: Not visualized. Abdominal aorta: No aneurysm visualized. Other findings: None. IMPRESSION: No acute abnormality or finding to explain the patient's symptoms. The pancreas and left kidney are not seen due to overlying bowel gas. Status post cholecystectomy Electronically Signed   By: Drusilla Kanner M.D.   On: 08/27/2019 16:18   Ct Abdomen Pelvis W Contrast  Result Date: 08/25/2019 CLINICAL DATA:  Abdominal pain. Hernia. EXAM: CT ABDOMEN AND PELVIS WITH CONTRAST TECHNIQUE: Multidetector CT imaging of the abdomen and pelvis was performed using the standard protocol following bolus administration of intravenous contrast. CONTRAST:  OMNIPAQUE IOHEXOL 300 MG/ML  SOLN COMPARISON:  December 18, 2017. FINDINGS: Lower chest: The lung bases are clear. The heart size is normal. Hepatobiliary: The liver  is normal. Status post cholecystectomy.There is no biliary ductal dilation. Pancreas: Normal contours without ductal dilatation. No peripancreatic fluid collection. Spleen: No splenic laceration or hematoma. Adrenals/Urinary Tract: --Adrenal glands: No adrenal hemorrhage. --Right kidney/ureter: No hydronephrosis or perinephric hematoma. --Left kidney/ureter: No hydronephrosis or perinephric hematoma. --Urinary bladder: Unremarkable. Stomach/Bowel: --Stomach/Duodenum: The patient is status post prior gastric bypass. --Small bowel: No dilatation or inflammation. --Colon: No focal abnormality. --Appendix: Normal. Vascular/Lymphatic: Normal course and caliber of the major abdominal vessels. --No retroperitoneal lymphadenopathy. --there are mildly prominent mesenteric lymph nodes. --No pelvic or inguinal lymphadenopathy. Reproductive: Unremarkable Other: No ascites or free air. The abdominal wall is normal. Musculoskeletal. The patient is status post prior vertebral augmentation of the T12 vertebral body. IMPRESSION: No acute abdominopelvic abnormality. Electronically Signed   By: Katherine Mantle M.D.   On: 08/25/2019 20:57   Mr Abdomen Mrcp Wo Contrast  Result Date: 08/28/2019 CLINICAL DATA:  Abdominal pain with elevated liver function studies. History of cholecystectomy in 2005 and previous gastric bypass. EXAM: MRI ABDOMEN WITHOUT CONTRAST  (INCLUDING MRCP) TECHNIQUE: Multiplanar multisequence MR imaging  of the abdomen was performed. Heavily T2-weighted images of the biliary and pancreatic ducts were obtained, and three-dimensional MRCP images were rendered by post processing. COMPARISON:  Abdominopelvic CT 08/25/2019 and 12/18/2017. Abdominal ultrasound 08/27/2019. FINDINGS: Despite efforts by the technologist and patient, mild motion artifact is present on today's exam and could not be eliminated. This reduces exam sensitivity and specificity. Lower chest:  The visualized lower chest appears unremarkable.  Hepatobiliary: The liver appears normal in signal without focal lesion or significant steatosis. Status post cholecystectomy. There is mild intra and extrahepatic biliary dilatation, similar to previous studies. The common hepatic duct measures 11 mm in diameter. There is a mildly dilated cystic duct remnant. No evidence of choledocholithiasis. Pancreas: Unremarkable. No pancreatic ductal dilatation or surrounding inflammatory changes. Spleen: Normal in size without focal abnormality. Adrenals/Urinary Tract: Both adrenal glands appear normal. The kidneys and ureters appear normal. Stomach/Bowel: No evidence of bowel wall thickening, distention or surrounding inflammatory change.Stable postoperative changes in the left upper quadrant consistent with previous gastric bypass. Vascular/Lymphatic: There are no enlarged abdominal lymph nodes. No significant vascular findings are present. Other: The visualized abdominal wall is intact. No ascites. Mild dependent subcutaneous edema in the back. Musculoskeletal: Status post T12 spinal augmentation. No acute osseous findings. IMPRESSION: 1. Stable mild biliary dilatation post cholecystectomy, without evidence of choledocholithiasis, likely physiologic. 2. No acute or focal hepatic abnormalities identified. 3. Previous gastric bypass. Electronically Signed   By: Carey Bullocks M.D.   On: 08/28/2019 14:17   Mr 3d Recon At Scanner  Result Date: 08/28/2019 CLINICAL DATA:  Abdominal pain with elevated liver function studies. History of cholecystectomy in 2005 and previous gastric bypass. EXAM: MRI ABDOMEN WITHOUT CONTRAST  (INCLUDING MRCP) TECHNIQUE: Multiplanar multisequence MR imaging of the abdomen was performed. Heavily T2-weighted images of the biliary and pancreatic ducts were obtained, and three-dimensional MRCP images were rendered by post processing. COMPARISON:  Abdominopelvic CT 08/25/2019 and 12/18/2017. Abdominal ultrasound 08/27/2019. FINDINGS: Despite efforts  by the technologist and patient, mild motion artifact is present on today's exam and could not be eliminated. This reduces exam sensitivity and specificity. Lower chest:  The visualized lower chest appears unremarkable. Hepatobiliary: The liver appears normal in signal without focal lesion or significant steatosis. Status post cholecystectomy. There is mild intra and extrahepatic biliary dilatation, similar to previous studies. The common hepatic duct measures 11 mm in diameter. There is a mildly dilated cystic duct remnant. No evidence of choledocholithiasis. Pancreas: Unremarkable. No pancreatic ductal dilatation or surrounding inflammatory changes. Spleen: Normal in size without focal abnormality. Adrenals/Urinary Tract: Both adrenal glands appear normal. The kidneys and ureters appear normal. Stomach/Bowel: No evidence of bowel wall thickening, distention or surrounding inflammatory change.Stable postoperative changes in the left upper quadrant consistent with previous gastric bypass. Vascular/Lymphatic: There are no enlarged abdominal lymph nodes. No significant vascular findings are present. Other: The visualized abdominal wall is intact. No ascites. Mild dependent subcutaneous edema in the back. Musculoskeletal: Status post T12 spinal augmentation. No acute osseous findings. IMPRESSION: 1. Stable mild biliary dilatation post cholecystectomy, without evidence of choledocholithiasis, likely physiologic. 2. No acute or focal hepatic abnormalities identified. 3. Previous gastric bypass. Electronically Signed   By: Carey Bullocks M.D.   On: 08/28/2019 14:17   Dg Chest Portable 1 View  Result Date: 08/23/2019 CLINICAL DATA:  Shortness of breath EXAM: PORTABLE CHEST 1 VIEW COMPARISON:  08/16/2019 FINDINGS: The heart size and mediastinal contours are within normal limits. Both lungs are clear. The visualized skeletal structures are unremarkable. Mild  patient rotation to the right accentuates the mediastinal  markings. IMPRESSION: No active disease. Electronically Signed   By: Alcide Clever M.D.   On: 08/23/2019 20:58   Dg Chest Portable 1 View  Result Date: 08/16/2019 CLINICAL DATA:  Shortness of breath, chest pain EXAM: PORTABLE CHEST 1 VIEW COMPARISON:  04/21/2019 FINDINGS: The heart size and mediastinal contours are within normal limits. Both lungs are clear. The visualized skeletal structures are unremarkable. IMPRESSION: No acute abnormality of the lungs in AP portable projection. Electronically Signed   By: Lauralyn Primes M.D.   On: 08/16/2019 21:17   [name] Ekg Site Rite  Result Date: 08/29/2019 If Site Rite image not attached, placement could not be confirmed due to current cardiac rhythm.   Catarina Hartshorn, DO  [facility] Pager [phone]  If 7PM-7AM, please contact night-coverage [email or web address] Password [email or web address] 08/30/2019, 2:05 PM   LOS: 2 days

## 2019-08-31 ENCOUNTER — Other Ambulatory Visit: Payer: Self-pay

## 2019-08-31 ENCOUNTER — Encounter (HOSPITAL_COMMUNITY)
Admission: EM | Disposition: A | Discharge status: Home / Self Care | Payer: Self-pay | Source: Ambulatory Visit | Attending: Internal Medicine

## 2019-08-31 ENCOUNTER — Inpatient Hospital Stay (HOSPITAL_COMMUNITY): Payer: Medicaid Other | Admitting: Anesthesiology

## 2019-08-31 ENCOUNTER — Encounter (HOSPITAL_COMMUNITY): Payer: Self-pay

## 2019-08-31 DIAGNOSIS — F410 Panic disorder [episodic paroxysmal anxiety] without agoraphobia: Secondary | ICD-10-CM

## 2019-08-31 DIAGNOSIS — K219 Gastro-esophageal reflux disease without esophagitis: Secondary | ICD-10-CM

## 2019-08-31 DIAGNOSIS — K287 Chronic gastrojejunal ulcer without hemorrhage or perforation: Secondary | ICD-10-CM

## 2019-08-31 DIAGNOSIS — Z980 Intestinal bypass and anastomosis status: Secondary | ICD-10-CM

## 2019-08-31 HISTORY — PX: ESOPHAGOGASTRODUODENOSCOPY (EGD) WITH PROPOFOL: SHX5813

## 2019-08-31 LAB — COMPREHENSIVE METABOLIC PANEL
ALT: 242 U/L — ABNORMAL HIGH (ref 0–44)
AST: 132 U/L — ABNORMAL HIGH (ref 15–41)
Albumin: 3.8 g/dL (ref 3.5–5.0)
Alkaline Phosphatase: 165 U/L — ABNORMAL HIGH (ref 38–126)
Anion gap: 7 (ref 5–15)
BUN: 11 mg/dL (ref 6–20)
CO2: 22 mmol/L (ref 22–32)
Calcium: 8.8 mg/dL — ABNORMAL LOW (ref 8.9–10.3)
Chloride: 106 mmol/L (ref 98–111)
Creatinine, Ser: 0.6 mg/dL (ref 0.44–1.00)
GFR calc Af Amer: >60 mL/min (ref >60)
GFR calc non Af Amer: >60 mL/min (ref >60)
Glucose, Bld: 100 mg/dL — ABNORMAL HIGH (ref 70–99)
Potassium: 4 mmol/L (ref 3.5–5.1)
Sodium: 135 mmol/L (ref 135–145)
Total Bilirubin: 0.6 mg/dL (ref 0.3–1.2)
Total Protein: 6.8 g/dL (ref 6.5–8.1)

## 2019-08-31 LAB — CERULOPLASMIN: Ceruloplasmin: 23.7 mg/dL (ref 19.0–39.0)

## 2019-08-31 LAB — IGG: IgG (Immunoglobin G), Serum: 691 mg/dL (ref 586–1602)

## 2019-08-31 LAB — ALPHA-1-ANTITRYPSIN: A-1 Antitrypsin, Ser: 125 mg/dL (ref 100–188)

## 2019-08-31 SURGERY — ESOPHAGOGASTRODUODENOSCOPY (EGD) WITH PROPOFOL
Anesthesia: General

## 2019-08-31 MED ORDER — OXYCODONE HCL 5 MG PO TABS: 5.0000 mg | ORAL_TABLET | Freq: Four times a day (QID) | ORAL | Status: DC | PRN

## 2019-08-31 MED ORDER — ONDANSETRON 4 MG PO TBDP: 4.0000 mg | ORAL_TABLET | Freq: Three times a day (TID) | ORAL | Status: DC | PRN

## 2019-08-31 MED ORDER — LIDOCAINE HCL 1 % IJ SOLN
INTRAMUSCULAR | Status: DC | PRN
  Administered 2019-08-31: 40 mg via INTRADERMAL

## 2019-08-31 MED ORDER — ONDANSETRON 4 MG PO TBDP: 4.0000 mg | ORAL_TABLET | Freq: Three times a day (TID) | ORAL | 0 refills | Status: DC | PRN

## 2019-08-31 MED ORDER — PROPOFOL 10 MG/ML IV BOLUS
INTRAVENOUS | Status: AC
  Filled 2019-08-31: qty 20

## 2019-08-31 MED ORDER — ALBUTEROL SULFATE HFA 108 (90 BASE) MCG/ACT IN AERS: 2.0000 | INHALATION_SPRAY | Freq: Four times a day (QID) | RESPIRATORY_TRACT | Status: DC | PRN

## 2019-08-31 MED ORDER — GLYCOPYRROLATE 0.2 MG/ML IJ SOLN
INTRAMUSCULAR | Status: DC | PRN
  Administered 2019-08-31: 0.2 mg via INTRAVENOUS

## 2019-08-31 MED ORDER — GLYCOPYRROLATE 0.2 MG/ML IJ SOLN
INTRAMUSCULAR | Status: AC
  Filled 2019-08-31: qty 1

## 2019-08-31 MED ORDER — KETAMINE HCL 50 MG/5ML IJ SOSY
PREFILLED_SYRINGE | INTRAMUSCULAR | Status: AC
  Filled 2019-08-31: qty 5

## 2019-08-31 MED ORDER — PROPOFOL 500 MG/50ML IV EMUL
INTRAVENOUS | Status: DC | PRN
  Administered 2019-08-31: 150 ug/kg/min via INTRAVENOUS

## 2019-08-31 MED ORDER — PROPOFOL 10 MG/ML IV BOLUS
INTRAVENOUS | Status: DC | PRN
  Administered 2019-08-31: 10 mg via INTRAVENOUS
  Administered 2019-08-31 (×6): 20 mg via INTRAVENOUS

## 2019-08-31 MED ORDER — ALBUTEROL SULFATE HFA 108 (90 BASE) MCG/ACT IN AERS: 2.0000 | INHALATION_SPRAY | Freq: Four times a day (QID) | RESPIRATORY_TRACT | 0 refills | Status: DC | PRN

## 2019-08-31 MED ORDER — ALBUTEROL SULFATE (2.5 MG/3ML) 0.083% IN NEBU: 2.5000 mg | INHALATION_SOLUTION | Freq: Four times a day (QID) | RESPIRATORY_TRACT | Status: DC | PRN

## 2019-08-31 MED ORDER — OXYCODONE HCL 5 MG PO TABS: 5.0000 mg | ORAL_TABLET | Freq: Four times a day (QID) | ORAL | 0 refills | Status: DC | PRN

## 2019-08-31 MED ORDER — PANTOPRAZOLE SODIUM 40 MG PO TBEC: 40.0000 mg | DELAYED_RELEASE_TABLET | Freq: Two times a day (BID) | ORAL | 3 refills | Status: DC

## 2019-08-31 MED ORDER — SODIUM CHLORIDE 0.9 % IV SOLN: INTRAVENOUS | Status: DC

## 2019-08-31 MED ORDER — LIDOCAINE HCL (PF) 1 % IJ SOLN
INTRAMUSCULAR | Status: AC
  Filled 2019-08-31: qty 5

## 2019-08-31 MED ORDER — KETAMINE HCL 10 MG/ML IJ SOLN
INTRAMUSCULAR | Status: DC | PRN
  Administered 2019-08-31 (×2): 10 mg via INTRAVENOUS

## 2019-08-31 MED ORDER — LACTATED RINGERS IV SOLN
INTRAVENOUS | Status: DC
  Administered 2019-08-31: 10:00:00 via INTRAVENOUS

## 2019-08-31 NOTE — Progress Notes (Signed)
  Subjective:  Patient complains of pain primarily left upper quadrant of her abdomen.  She states she has not had a bowel movement since she was hospitalized.  She does not complain of shortness of breath or chest pain this morning  Objective: Blood pressure (!) 125/91, pulse 95, temperature 97.8 F (36.6 C), temperature source Oral, resp. rate 18, height '5\' 6"'$  (1.676 m), weight 90.7 kg, SpO2 95 %. Patient is alert and in no acute distress. Cardiac exam with regular rhythm normal S1 and S2.  No murmur gallop noted. Auscultation lungs reveal vesicular breath sounds bilaterally. Abdomen is symmetrical and soft.  She has mild tenderness across upper abdomen without guarding or rebound.  No organomegaly or masses.  Labs/studies Results:  CBC Latest Ref Rng & Units 08/30/2019 08/29/2019 08/28/2019  WBC 4.0 - 10.5 K/uL 7.4 8.6 3.7(L)  Hemoglobin 12.0 - 15.0 g/dL 12.5 13.1 13.0  Hematocrit 36.0 - 46.0 % 38.1 40.3 40.9  Platelets 150 - 400 K/uL 220 229 206    CMP Latest Ref Rng & Units 08/31/2019 08/30/2019 08/29/2019  Glucose 70 - 99 mg/dL 100(H) 94 92  BUN 6 - 20 mg/dL '11 9 9  '$ Creatinine 0.44 - 1.00 mg/dL 0.60 0.60 0.55  Sodium 135 - 145 mmol/L 135 137 135  Potassium 3.5 - 5.1 mmol/L 4.0 3.7 3.9  Chloride 98 - 111 mmol/L 106 108 106  CO2 22 - 32 mmol/L '22 23 24  '$ Calcium 8.9 - 10.3 mg/dL 8.8(L) 8.6(L) 9.0  Total Protein 6.5 - 8.1 g/dL 6.8 6.1(L) 6.6  Total Bilirubin 0.3 - 1.2 mg/dL 0.6 0.4 0.4  Alkaline Phos 38 - 126 U/L 165(H) 102 117  AST 15 - 41 U/L 132(H) 58(H) 99(H)  ALT 0 - 44 U/L 242(H) 289(H) 371(H)    Hepatic Function Latest Ref Rng & Units 08/31/2019 08/30/2019 08/29/2019  Total Protein 6.5 - 8.1 g/dL 6.8 6.1(L) 6.6  Albumin 3.5 - 5.0 g/dL 3.8 3.5 3.8  AST 15 - 41 U/L 132(H) 58(H) 99(H)  ALT 0 - 44 U/L 242(H) 289(H) 371(H)  Alk Phosphatase 38 - 126 U/L 165(H) 102 117  Total Bilirubin 0.3 - 1.2 mg/dL 0.6 0.4 0.4  Bilirubin, Direct <=0.2 mg/dL - - -       Assessment:  #1.   Upper abdominal pain.  She has a history of anastomotic ulcers and has been taking ibuprofen.  She has multiple reasons for abdominal pain.  #2.  Elevated transaminases.  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  Upper GI tract anatomy is altered and ERCP may be difficult if not impossible.  Plan is for her to be referred to [facility].  #3.  History of shortness of breath and syncope.  Work-up so far negative.  #4.  Depression and anxiety.  MND deficiency.   Recommendations  Proceed with diagnostic esophagogastroduodenoscopy. Start polyethylene glycol this evening and give a Fleet enema this afternoon.

## 2019-08-31 NOTE — Anesthesia Preprocedure Evaluation (Addendum)
Anesthesia Evaluation  Patient identified by MRN, date of birth, ID band Patient awake  General Assessment Comment:Denies recent anesth issues when questioned   Reviewed: Allergy & Precautions, NPO status , Patient's Chart, lab work & pertinent test results  History of Anesthesia Complications (+) PONV, Family history of anesthesia reaction and history of anesthetic complications  Airway Mallampati: I  TM Distance: >3 FB Neck ROM: Full    Dental no notable dental hx. (+) Teeth Intact   Pulmonary shortness of breath and at rest, Current Smoker,  Reports SOB now - getting Xopenex  vapes  Smokes MJ ~ weekly    Pulmonary exam normal breath sounds clear to auscultation       Cardiovascular Exercise Tolerance: Poor hypertension, Pt. on medications + angina Normal cardiovascular exam(-) dysrhythmias III Rhythm:Regular Rate:Normal  Recent Echo read as normal , no ASD seen  Pt reports ASD Last ECG read as normal  Reports inability to walk one block States has worn holter 3x in past without finding    Neuro/Psych  Headaches, Seizures -, Poorly Controlled,  PSYCHIATRIC DISORDERS Anxiety Depression ADHD on meds  Very flat affect Speaks very softly H/o psuedoSz was on meds -reports stopping all meds for this about 2 months ago -states having recent pseudoSz Reports current CP/ and current SOB  Neuromuscular disease    GI/Hepatic Neg liver ROS, PUD, GERD  Medicated and Controlled,  Endo/Other  negative endocrine ROS  Renal/GU negative Renal ROSReports Interstitial cystitis   negative genitourinary   Musculoskeletal  (+) Fibromyalgia -, narcotic dependent  Abdominal   Peds negative pediatric ROS (+)  Hematology negative hematology ROS (+) anemia ,   Anesthesia Other Findings   Reproductive/Obstetrics negative OB ROS                           Anesthesia Physical Anesthesia Plan  ASA:  IV  Anesthesia Plan: General   Post-op Pain Management:    Induction: Intravenous  PONV Risk Score and Plan: 3 and TIVA, Propofol infusion, Treatment may vary due to age or medical condition and Ondansetron  Airway Management Planned: Nasal Cannula and Simple Face Mask  Additional Equipment:   Intra-op Plan:   Post-operative Plan:   Informed Consent: I have reviewed the patients History and Physical, chart, labs and discussed the procedure including the risks, benefits and alternatives for the proposed anesthesia with the patient or authorized representative who has indicated his/her understanding and acceptance.     Dental advisory given  Plan Discussed with: CRNA  Anesthesia Plan Comments: (Asked pt if she feels up to this -stated yes   Plan Full PPE use  Plan GA with GETA as needed d/w pt -WTP with same after Q&A  D/w pt will attempt without ETT, but if ETT needed d/w pt possibility of postprocedure ventilation -voiced understanding /WTP)        Anesthesia Quick Evaluation

## 2019-08-31 NOTE — Progress Notes (Signed)
Brief EGD note.  Normal mucosa of the esophagus. More gastric pouch with patent gastrojejunostomy with small ulcer on the small bowel side. Efferent loop examined for several centimeters was normal. Afferent loop also examined but could not reach the papilla.

## 2019-08-31 NOTE — Transfer of Care (Signed)
Immediate Anesthesia Transfer of Care Note  [name]  Procedure(s) Performed: ESOPHAGOGASTRODUODENOSCOPY (EGD) WITH PROPOFOL (N/A )  Patient Location: PACU  Anesthesia Type:General  Level of Consciousness: awake  Airway & Oxygen Therapy: Patient Spontanous Breathing  Post-op Assessment: Report given to RN  Post vital signs: Reviewed and stable  Last Vitals:  Vitals Value Taken Time  BP 108/79 08/31/19 1045  Temp    Pulse 91 08/31/19 1045  Resp 26 08/31/19 1045  SpO2 95 % 08/31/19 1045  Vitals shown include unvalidated device data.  Last Pain:  Vitals:   08/31/19 1015  TempSrc:   PainSc: 10-Worst pain ever      Patients Stated Pain Goal: 3 ([id number])  Complications: No apparent anesthesia complications

## 2019-08-31 NOTE — Op Note (Signed)
[facility] Patient Name: Penny [name] Procedure Date: 08/31/2019 9:50 AM MRN: [id number] Date of Birth: [birth year] Attending MD: Hildred Laser , MD CSN: [id number] Age: 33 Admit Type: Inpatient Procedure:                Upper GI endoscopy Indications:              Upper abdominal pain, Follow-up of chronic                            gastrojejunal ulcer Providers:                Hildred Laser, MD, Otis Peak B. Sharon Seller, RN, Aram Candela Referring MD:             Orson Eva, DO Medicines:                Propofol per Anesthesia Complications:            No immediate complications. Estimated Blood Loss:     Estimated blood loss: none. Procedure:                Pre-Anesthesia Assessment:                           - Prior to the procedure, a History and Physical                            was performed, and patient medications and                            allergies were reviewed. The patient's tolerance of                            previous anesthesia was also reviewed. The risks                            and benefits of the procedure and the sedation                            options and risks were discussed with the patient.                            All questions were answered, and informed consent                            was obtained. Prior Anticoagulants: The patient has                            taken no previous anticoagulant or antiplatelet                            agents. ASA Grade Assessment: III - A patient with  severe systemic disease. After reviewing the risks                            and benefits, the patient was deemed in                            satisfactory condition to undergo the procedure.                           After obtaining informed consent, the endoscope was                            passed under direct vision. Throughout the                            procedure, the patient's blood  pressure, pulse, and                            oxygen saturations were monitored continuously. The                            [id number] scope was introduced through the                            mouth, and advanced to the mid-jejunum. The upper                            GI endoscopy was accomplished without difficulty.                            The patient tolerated the procedure well. Scope In: 10:20:16 AM Scope Out: 10:35:20 AM Total Procedure Duration: 0 hours 15 minutes 4 seconds  Findings:      The examined esophagus was normal.      The Z-line was regular and was found 37 cm from the incisors.      Evidence of a gastrojejunostomy was found in the gastric body. Small       gastric pouch. This was characterized by ulceration at anastamosis.      The examined jejunum was normal. Both afferent and efferent loops were       examined. Papilla not reached. Impression:               - Normal esophagus.                           - Z-line regular, 37 cm from the incisors.                           - Small gastric pouch with patent                            gastro-jejunostomy.                           - Samll anatamotic ulcer .                           -  Normal examined jejunum; both afferent and                            efferent loops.                           - No specimens collected. Moderate Sedation:      Per Anesthesia Care Recommendation:           - Return patient to hospital ward for ongoing care.                           - Resume previous diet today.                           - Continue present medications.                           - No NSAIDs. 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 All rights reserved. The codes documented in this report are preliminary and upon coder review may  be revised to meet current compliance requirements. Hildred Laser, MD Hildred Laser, MD 08/31/2019 10:48:11 AM This report has been signed electronically. Number of Addenda: 0

## 2019-08-31 NOTE — Discharge Summary (Addendum)
Physician Discharge Summary  Gera Stater [facility] ZOX:[id number] DOB: [birth year] DOA: 08/27/2019  PCP: Babs Sciara, MD  Admit date: 08/27/2019 Discharge date: 08/31/2019  Admitted From: Home Disposition:  Home / SNF  Recommendations for Outpatient Follow-up:  1. Follow up with PCP in 1-2 weeks 2. Please obtain BMP/CBC in one week    Discharge Condition: Stable CODE STATUS: FULL Diet recommendation: soft   Brief/Interim Summary: 33 y.o.femalewith medical history significant forPUD,pseudoseizures and seizures, gastric bypass, neuropathy, ADHD, anxiety and depression.Patient presented to the ED with 2 weeks of upper abdominal pain, with nausea. She also reports vomiting on a few days, once a day. Pain is worse with meals,she has barely been eating. No diarrhea, she reports constipation. Abdominal pain is mostly left upper quadrant radiating,now involving her whole upper abdomen, and radiates to her back.She denies fever or chills. No black stools. Cant remember her last bowel movement. She reports compliance with her Protonix. She alsoreports NSAID use-800 mgup to3 times a day,for chronic back pain and now abdominal pain. She reports abdominal painfeelsdifferent from her prior peptic ulcer disease pain. Denies urinary symptoms.  GI was consulted to assist with management.  She was initially placed on bowel rest.  As she slowly improved, her diet was advanced which she tolerated without any emesis.    Discharge Diagnoses:  Intractable abdominal pain and nausea -pt seen eating macaroni and cheese and cooked apples on day of discharge without pain or difficulty -08/28/19 MRCP--neg for choledocholithiasis; no focal acute hepatic abnormality -08/25/19 CT abd/pelvis--neg for acute findings -08/27/19 RUQ US--neg acute findings -phenergan prn refractory nausea -continue PPI bid -UA neg for pyuria -continue prn IV dilaudid -08/31/19 EGD--small anastomotic ulcer,  patent gastrojejunostomy -9/25--case discussed with Dr. 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 and fluctuating LFTs--he will arrange out pt follow up at [facility] for pt to have ERCP there  Syncope -likely vagal due to pain/volume depletion -echo--EF 50-55%, normal RV; trivial TR; no ASD -July 2020 event monitor <1% PACs/PVC, no concerning dysrhythmia -place on tele--no concerning dysrhythmias -orthostatics--positive by pulse -started IVF -TSH 2.616, Free T4 0.79  Bronchospasm -pt having intermittent wheezing -08/25/19 CXR neg -repeat CXR 9/24 personally reviewed--no edema or consilidation -started pulmicort and albuterol with improvement -remained stable on RA -d/c home with albuterol rescue inhaler  Atypical chest discomfort -suspect GI etiology -cycle troponin--neg x 2 -EKG--personally reviewed--sinus, no STT changes -D-Dimer--neg -no concerning dysrhythmia on telemetry  Transaminasemia -hep B surface antigen neg -hep C antibody neg -suspect medication/toxin induced -new med = zanaflex ??contribution -08/29/19--pt refused blood work -repeat LFTs--trending down -appreciate GI follow up -08/31/19 EGD--small anastomotic ulcer, patent gastrojejunostomy -9/25--case discussed with Dr. 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 and fluctuating LFTs--he will arrange out pt follow up at [facility] for pt to have ERCP there -alpha-1 antitrypsin--normal -overall trending down at time of d/c  THC use/abuse -?contributing to elevated LFTs  Depression/anxiety -continue cymbalta and topamax -holding adderall due to palpitations  History gastric bypass -diet per GI  Vitamin D deficiency -25 vitamin D--18.6 -supplement when able to tolerate po better  Poor IV access -PICC line placement 08/29/19--->removed on day of d/c      Discharge Instructions   Allergies as of 08/31/2019      Reactions   Gabapentin  Other (See Comments)   Pt states that she was unresponsive after taking this medication, but her vitals remained stable.     Metoclopramide Hcl Anxiety, Other (See Comments)  Pt states that she felt like she was trapped in a box, and could not get out.  Pt also states that she had temporary loss of movement, weakness, and tingling.     Tramadol Other (See Comments)   Seizures   Ativan [lorazepam] Other (See Comments)   combative   Latex Itching, Rash, Other (See Comments)   Burns   Lyrica [pregabalin] Other (See Comments)   suicidal   Trazodone And Nefazodone Other (See Comments)   Makes pt "like a zombie"   Zofran [ondansetron] Other (See Comments)   Migraines in high doses    Sulfa Antibiotics Hives   Butrans [buprenorphine] Rash   Patch caused rash, didn't help with pain   Nucynta [tapentadol] Nausea And Vomiting   Propofol Nausea And Vomiting   Tape Itching, Rash, Other (See Comments)   Please use paper tape   Toradol [ketorolac Tromethamine] Anxiety      Medication List    STOP taking these medications   cyclobenzaprine 5 MG tablet Commonly known as: FLEXERIL   predniSONE 20 MG tablet Commonly known as: DELTASONE     TAKE these medications   amphetamine-dextroamphetamine 30 MG 24 hr capsule Commonly known as: Adderall XR TAKE (1) CAPSULE BY MOUTH EVERY DAY. What changed:   how much to take  how to take this  when to take this  additional instructions   DULoxetine 60 MG capsule Commonly known as: CYMBALTA Take 1 capsule (60 mg total) by mouth 2 (two) times daily.   ondansetron 8 MG disintegrating tablet Commonly known as: Zofran ODT Take 1 tablet (8 mg total) by mouth every 8 (eight) hours as needed for nausea or vomiting. What changed: Another medication with the same name was added. Make sure you understand how and when to take each.   ondansetron 4 MG disintegrating tablet Commonly known as: ZOFRAN-ODT Take 1 tablet (4 mg total) by mouth every 8  (eight) hours as needed for nausea or vomiting. What changed: You were already taking a medication with the same name, and this prescription was added. Make sure you understand how and when to take each.   oxyCODONE 5 MG immediate release tablet Commonly known as: Oxy IR/ROXICODONE Take 1 tablet (5 mg total) by mouth every 6 (six) hours as needed for moderate pain.   pantoprazole 40 MG tablet Commonly known as: PROTONIX Take 1 tablet (40 mg total) by mouth 2 (two) times daily before a meal. What changed: when to take this   ProAir HFA 108 (90 Base) MCG/ACT inhaler Generic drug: albuterol INHALE 2 PUFFS INTO THE LUNGS EVERY 6 HOURS AS NEEDED. What changed: See the new instructions.   albuterol 108 (90 Base) MCG/ACT inhaler Commonly known as: VENTOLIN HFA Inhale 2 puffs into the lungs every 6 (six) hours as needed for wheezing or shortness of breath. What changed: You were already taking a medication with the same name, and this prescription was added. Make sure you understand how and when to take each.   promethazine 25 MG tablet Commonly known as: PHENERGAN TAKE ONE TABLET BY MOUTH EVERY 6 HOURS AS NEEDED. What changed: reasons to take this   Pyridium 100 MG tablet Generic drug: phenazopyridine Take 100 mg by mouth 3 (three) times daily as needed for pain.   tiZANidine 4 MG tablet Commonly known as: ZANAFLEX Take 4 mg by mouth 4 (four) times daily as needed for muscle spasms.   topiramate 50 MG tablet Commonly known as: TOPAMAX TAKE ONE IN THE AM  AND 2 AT NIGHT What changed: See the new instructions.   valACYclovir 1000 MG tablet Commonly known as: VALTREX Take 1 bid for 10 days then Take  1 daily for suppression What changed:   how much to take  how to take this  when to take this  additional instructions   zolpidem 10 MG tablet Commonly known as: Ambien Take 1 tablet (10 mg total) by mouth at bedtime.       Allergies  Allergen Reactions   Gabapentin  Other (See Comments)    Pt states that she was unresponsive after taking this medication, but her vitals remained stable.     Metoclopramide Hcl Anxiety and Other (See Comments)    Pt states that she felt like she was trapped in a box, and could not get out.  Pt also states that she had temporary loss of movement, weakness, and tingling.     Tramadol Other (See Comments)    Seizures   Ativan [Lorazepam] Other (See Comments)    combative   Latex Itching, Rash and Other (See Comments)    Burns   Lyrica [Pregabalin] Other (See Comments)    suicidal   Trazodone And Nefazodone Other (See Comments)    Makes pt "like a zombie"   Zofran [Ondansetron] Other (See Comments)    Migraines in high doses     Sulfa Antibiotics Hives   Butrans [Buprenorphine] Rash    Patch caused rash, didn't help with pain   Nucynta [Tapentadol] Nausea And Vomiting   Propofol Nausea And Vomiting   Tape Itching, Rash and Other (See Comments)    Please use paper tape   Toradol [Ketorolac Tromethamine] Anxiety    Consultations:  GI--Dr. Karilyn Cota   Procedures/Studies: Dg Chest 2 View  Result Date: 08/25/2019 CLINICAL DATA:  Shortness of breath EXAM: CHEST - 2 VIEW COMPARISON:  08/23/2019 FINDINGS: The heart size and mediastinal contours are within normal limits. Both lungs are clear. The visualized skeletal structures are unremarkable. IMPRESSION: No acute abnormality of the lungs. Electronically Signed   By: Lauralyn Primes M.D.   On: 08/25/2019 18:47   US Abdomen Complete  Result Date: 08/27/2019 CLINICAL DATA:  Abdominal pain and nausea for 2 weeks. EXAM: ABDOMEN ULTRASOUND COMPLETE COMPARISON:  CT abdomen and pelvis 08/25/2019. FINDINGS: Gallbladder: Removed. Common bile duct: Diameter: 1.1 cm proximally, unchanged. Liver: No focal lesion identified. Within normal limits in parenchymal echogenicity. Portal vein is patent on color Doppler imaging with normal direction of blood flow towards the liver.  IVC: No abnormality visualized. Pancreas: Not visualized. Spleen: Size and appearance within normal limits. Right Kidney: Length: Approximately 10.4 cm. Echogenicity within normal limits. No mass or hydronephrosis visualized. Left Kidney: Length: Not visualized. Abdominal aorta: No aneurysm visualized. Other findings: None. IMPRESSION: No acute abnormality or finding to explain the patient's symptoms. The pancreas and left kidney are not seen due to overlying bowel gas. Status post cholecystectomy Electronically Signed   By: Drusilla Kanner M.D.   On: 08/27/2019 16:18   Ct Abdomen Pelvis W Contrast  Result Date: 08/25/2019 CLINICAL DATA:  Abdominal pain. Hernia. EXAM: CT ABDOMEN AND PELVIS WITH CONTRAST TECHNIQUE: Multidetector CT imaging of the abdomen and pelvis was performed using the standard protocol following bolus administration of intravenous contrast. CONTRAST:  OMNIPAQUE IOHEXOL 300 MG/ML  SOLN COMPARISON:  December 18, 2017. FINDINGS: Lower chest: The lung bases are clear. The heart size is normal. Hepatobiliary: The liver is normal. Status post cholecystectomy.There is no biliary ductal dilation.  Pancreas: Normal contours without ductal dilatation. No peripancreatic fluid collection. Spleen: No splenic laceration or hematoma. Adrenals/Urinary Tract: --Adrenal glands: No adrenal hemorrhage. --Right kidney/ureter: No hydronephrosis or perinephric hematoma. --Left kidney/ureter: No hydronephrosis or perinephric hematoma. --Urinary bladder: Unremarkable. Stomach/Bowel: --Stomach/Duodenum: The patient is status post prior gastric bypass. --Small bowel: No dilatation or inflammation. --Colon: No focal abnormality. --Appendix: Normal. Vascular/Lymphatic: Normal course and caliber of the major abdominal vessels. --No retroperitoneal lymphadenopathy. --there are mildly prominent mesenteric lymph nodes. --No pelvic or inguinal lymphadenopathy. Reproductive: Unremarkable Other: No ascites or free air. The  abdominal wall is normal. Musculoskeletal. The patient is status post prior vertebral augmentation of the T12 vertebral body. IMPRESSION: No acute abdominopelvic abnormality. Electronically Signed   By: Katherine Mantle M.D.   On: 08/25/2019 20:57   Mr Abdomen Mrcp Wo Contrast  Result Date: 08/28/2019 CLINICAL DATA:  Abdominal pain with elevated liver function studies. History of cholecystectomy in 2005 and previous gastric bypass. EXAM: MRI ABDOMEN WITHOUT CONTRAST  (INCLUDING MRCP) TECHNIQUE: Multiplanar multisequence MR imaging of the abdomen was performed. Heavily T2-weighted images of the biliary and pancreatic ducts were obtained, and three-dimensional MRCP images were rendered by post processing. COMPARISON:  Abdominopelvic CT 08/25/2019 and 12/18/2017. Abdominal ultrasound 08/27/2019. FINDINGS: Despite efforts by the technologist and patient, mild motion artifact is present on today's exam and could not be eliminated. This reduces exam sensitivity and specificity. Lower chest:  The visualized lower chest appears unremarkable. Hepatobiliary: The liver appears normal in signal without focal lesion or significant steatosis. Status post cholecystectomy. There is mild intra and extrahepatic biliary dilatation, similar to previous studies. The common hepatic duct measures 11 mm in diameter. There is a mildly dilated cystic duct remnant. No evidence of choledocholithiasis. Pancreas: Unremarkable. No pancreatic ductal dilatation or surrounding inflammatory changes. Spleen: Normal in size without focal abnormality. Adrenals/Urinary Tract: Both adrenal glands appear normal. The kidneys and ureters appear normal. Stomach/Bowel: No evidence of bowel wall thickening, distention or surrounding inflammatory change.Stable postoperative changes in the left upper quadrant consistent with previous gastric bypass. Vascular/Lymphatic: There are no enlarged abdominal lymph nodes. No significant vascular findings are  present. Other: The visualized abdominal wall is intact. No ascites. Mild dependent subcutaneous edema in the back. Musculoskeletal: Status post T12 spinal augmentation. No acute osseous findings. IMPRESSION: 1. Stable mild biliary dilatation post cholecystectomy, without evidence of choledocholithiasis, likely physiologic. 2. No acute or focal hepatic abnormalities identified. 3. Previous gastric bypass. Electronically Signed   By: Carey Bullocks M.D.   On: 08/28/2019 14:17   Mr 3d Recon At Scanner  Result Date: 08/28/2019 CLINICAL DATA:  Abdominal pain with elevated liver function studies. History of cholecystectomy in 2005 and previous gastric bypass. EXAM: MRI ABDOMEN WITHOUT CONTRAST  (INCLUDING MRCP) TECHNIQUE: Multiplanar multisequence MR imaging of the abdomen was performed. Heavily T2-weighted images of the biliary and pancreatic ducts were obtained, and three-dimensional MRCP images were rendered by post processing. COMPARISON:  Abdominopelvic CT 08/25/2019 and 12/18/2017. Abdominal ultrasound 08/27/2019. FINDINGS: Despite efforts by the technologist and patient, mild motion artifact is present on today's exam and could not be eliminated. This reduces exam sensitivity and specificity. Lower chest:  The visualized lower chest appears unremarkable. Hepatobiliary: The liver appears normal in signal without focal lesion or significant steatosis. Status post cholecystectomy. There is mild intra and extrahepatic biliary dilatation, similar to previous studies. The common hepatic duct measures 11 mm in diameter. There is a mildly dilated cystic duct remnant. No evidence of choledocholithiasis. Pancreas: Unremarkable. No pancreatic ductal  dilatation or surrounding inflammatory changes. Spleen: Normal in size without focal abnormality. Adrenals/Urinary Tract: Both adrenal glands appear normal. The kidneys and ureters appear normal. Stomach/Bowel: No evidence of bowel wall thickening, distention or surrounding  inflammatory change.Stable postoperative changes in the left upper quadrant consistent with previous gastric bypass. Vascular/Lymphatic: There are no enlarged abdominal lymph nodes. No significant vascular findings are present. Other: The visualized abdominal wall is intact. No ascites. Mild dependent subcutaneous edema in the back. Musculoskeletal: Status post T12 spinal augmentation. No acute osseous findings. IMPRESSION: 1. Stable mild biliary dilatation post cholecystectomy, without evidence of choledocholithiasis, likely physiologic. 2. No acute or focal hepatic abnormalities identified. 3. Previous gastric bypass. Electronically Signed   By: Carey Bullocks M.D.   On: 08/28/2019 14:17   Dg Chest Port 1 View  Result Date: 08/30/2019 CLINICAL DATA:  33 year old female with a history shortness of breath EXAM: PORTABLE CHEST 1 VIEW COMPARISON:  August 25, 2019 FINDINGS: Cardiomediastinal silhouette within normal limits in size and contour. No evidence of central vascular congestion. Interval placement right upper extremity PICC with the tip appearing to terminate superior vena cava. No confluent airspace disease pneumothorax or pleural effusion. IMPRESSION: Negative for acute cardiopulmonary disease. Right upper extremity PICC Electronically Signed   By: Gilmer Mor D.O.   On: 08/30/2019 14:42   Dg Chest Portable 1 View  Result Date: 08/23/2019 CLINICAL DATA:  Shortness of breath EXAM: PORTABLE CHEST 1 VIEW COMPARISON:  08/16/2019 FINDINGS: The heart size and mediastinal contours are within normal limits. Both lungs are clear. The visualized skeletal structures are unremarkable. Mild patient rotation to the right accentuates the mediastinal markings. IMPRESSION: No active disease. Electronically Signed   By: Alcide Clever M.D.   On: 08/23/2019 20:58   Dg Chest Portable 1 View  Result Date: 08/16/2019 CLINICAL DATA:  Shortness of breath, chest pain EXAM: PORTABLE CHEST 1 VIEW COMPARISON:  04/21/2019  FINDINGS: The heart size and mediastinal contours are within normal limits. Both lungs are clear. The visualized skeletal structures are unremarkable. IMPRESSION: No acute abnormality of the lungs in AP portable projection. Electronically Signed   By: Lauralyn Primes M.D.   On: 08/16/2019 21:17   [name] Ekg Site Rite  Result Date: 08/29/2019 If Site Rite image not attached, placement could not be confirmed due to current cardiac rhythm.        Discharge Exam: Vitals:   08/31/19 1045 08/31/19 1224  BP: 108/79 112/73  Pulse: 91 93  Resp: (!) 26 20  Temp: 97.7 F (36.5 C) 98.2 F (36.8 C)  SpO2: 95% 94%   Vitals:   08/31/19 0742 08/31/19 0940 08/31/19 1045 08/31/19 1224  BP:  (!) 125/91 108/79 112/73  Pulse:  95 91 93  Resp:  18 (!) 26 20  Temp:  97.8 F (36.6 C) 97.7 F (36.5 C) 98.2 F (36.8 C)  TempSrc:  Oral  Oral  SpO2: 97% 95% 95% 94%  Weight:      Height:        General: Pt is alert, awake, not in acute distress Cardiovascular: RRR, S1/S2 +, no rubs, no gallops Respiratory: CTA bilaterally, no wheezing, no rhonchi Abdominal: Soft, mild upper abd pain without rebound, ND, bowel sounds + Extremities: no edema, no cyanosis   The results of significant diagnostics from this hospitalization (including imaging, microbiology, ancillary and laboratory) are listed below for reference.    Significant Diagnostic Studies: Dg Chest 2 View  Result Date: 08/25/2019 CLINICAL DATA:  Shortness of breath EXAM:  CHEST - 2 VIEW COMPARISON:  08/23/2019 FINDINGS: The heart size and mediastinal contours are within normal limits. Both lungs are clear. The visualized skeletal structures are unremarkable. IMPRESSION: No acute abnormality of the lungs. Electronically Signed   By: Lauralyn Primes M.D.   On: 08/25/2019 18:47   US Abdomen Complete  Result Date: 08/27/2019 CLINICAL DATA:  Abdominal pain and nausea for 2 weeks. EXAM: ABDOMEN ULTRASOUND COMPLETE COMPARISON:  CT abdomen and pelvis  08/25/2019. FINDINGS: Gallbladder: Removed. Common bile duct: Diameter: 1.1 cm proximally, unchanged. Liver: No focal lesion identified. Within normal limits in parenchymal echogenicity. Portal vein is patent on color Doppler imaging with normal direction of blood flow towards the liver. IVC: No abnormality visualized. Pancreas: Not visualized. Spleen: Size and appearance within normal limits. Right Kidney: Length: Approximately 10.4 cm. Echogenicity within normal limits. No mass or hydronephrosis visualized. Left Kidney: Length: Not visualized. Abdominal aorta: No aneurysm visualized. Other findings: None. IMPRESSION: No acute abnormality or finding to explain the patient's symptoms. The pancreas and left kidney are not seen due to overlying bowel gas. Status post cholecystectomy Electronically Signed   By: Drusilla Kanner M.D.   On: 08/27/2019 16:18   Ct Abdomen Pelvis W Contrast  Result Date: 08/25/2019 CLINICAL DATA:  Abdominal pain. Hernia. EXAM: CT ABDOMEN AND PELVIS WITH CONTRAST TECHNIQUE: Multidetector CT imaging of the abdomen and pelvis was performed using the standard protocol following bolus administration of intravenous contrast. CONTRAST:  OMNIPAQUE IOHEXOL 300 MG/ML  SOLN COMPARISON:  December 18, 2017. FINDINGS: Lower chest: The lung bases are clear. The heart size is normal. Hepatobiliary: The liver is normal. Status post cholecystectomy.There is no biliary ductal dilation. Pancreas: Normal contours without ductal dilatation. No peripancreatic fluid collection. Spleen: No splenic laceration or hematoma. Adrenals/Urinary Tract: --Adrenal glands: No adrenal hemorrhage. --Right kidney/ureter: No hydronephrosis or perinephric hematoma. --Left kidney/ureter: No hydronephrosis or perinephric hematoma. --Urinary bladder: Unremarkable. Stomach/Bowel: --Stomach/Duodenum: The patient is status post prior gastric bypass. --Small bowel: No dilatation or inflammation. --Colon: No focal abnormality.  --Appendix: Normal. Vascular/Lymphatic: Normal course and caliber of the major abdominal vessels. --No retroperitoneal lymphadenopathy. --there are mildly prominent mesenteric lymph nodes. --No pelvic or inguinal lymphadenopathy. Reproductive: Unremarkable Other: No ascites or free air. The abdominal wall is normal. Musculoskeletal. The patient is status post prior vertebral augmentation of the T12 vertebral body. IMPRESSION: No acute abdominopelvic abnormality. Electronically Signed   By: Katherine Mantle M.D.   On: 08/25/2019 20:57   Mr Abdomen Mrcp Wo Contrast  Result Date: 08/28/2019 CLINICAL DATA:  Abdominal pain with elevated liver function studies. History of cholecystectomy in 2005 and previous gastric bypass. EXAM: MRI ABDOMEN WITHOUT CONTRAST  (INCLUDING MRCP) TECHNIQUE: Multiplanar multisequence MR imaging of the abdomen was performed. Heavily T2-weighted images of the biliary and pancreatic ducts were obtained, and three-dimensional MRCP images were rendered by post processing. COMPARISON:  Abdominopelvic CT 08/25/2019 and 12/18/2017. Abdominal ultrasound 08/27/2019. FINDINGS: Despite efforts by the technologist and patient, mild motion artifact is present on today's exam and could not be eliminated. This reduces exam sensitivity and specificity. Lower chest:  The visualized lower chest appears unremarkable. Hepatobiliary: The liver appears normal in signal without focal lesion or significant steatosis. Status post cholecystectomy. There is mild intra and extrahepatic biliary dilatation, similar to previous studies. The common hepatic duct measures 11 mm in diameter. There is a mildly dilated cystic duct remnant. No evidence of choledocholithiasis. Pancreas: Unremarkable. No pancreatic ductal dilatation or surrounding inflammatory changes. Spleen: Normal in size without focal  abnormality. Adrenals/Urinary Tract: Both adrenal glands appear normal. The kidneys and ureters appear normal.  Stomach/Bowel: No evidence of bowel wall thickening, distention or surrounding inflammatory change.Stable postoperative changes in the left upper quadrant consistent with previous gastric bypass. Vascular/Lymphatic: There are no enlarged abdominal lymph nodes. No significant vascular findings are present. Other: The visualized abdominal wall is intact. No ascites. Mild dependent subcutaneous edema in the back. Musculoskeletal: Status post T12 spinal augmentation. No acute osseous findings. IMPRESSION: 1. Stable mild biliary dilatation post cholecystectomy, without evidence of choledocholithiasis, likely physiologic. 2. No acute or focal hepatic abnormalities identified. 3. Previous gastric bypass. Electronically Signed   By: Carey Bullocks M.D.   On: 08/28/2019 14:17   Mr 3d Recon At Scanner  Result Date: 08/28/2019 CLINICAL DATA:  Abdominal pain with elevated liver function studies. History of cholecystectomy in 2005 and previous gastric bypass. EXAM: MRI ABDOMEN WITHOUT CONTRAST  (INCLUDING MRCP) TECHNIQUE: Multiplanar multisequence MR imaging of the abdomen was performed. Heavily T2-weighted images of the biliary and pancreatic ducts were obtained, and three-dimensional MRCP images were rendered by post processing. COMPARISON:  Abdominopelvic CT 08/25/2019 and 12/18/2017. Abdominal ultrasound 08/27/2019. FINDINGS: Despite efforts by the technologist and patient, mild motion artifact is present on today's exam and could not be eliminated. This reduces exam sensitivity and specificity. Lower chest:  The visualized lower chest appears unremarkable. Hepatobiliary: The liver appears normal in signal without focal lesion or significant steatosis. Status post cholecystectomy. There is mild intra and extrahepatic biliary dilatation, similar to previous studies. The common hepatic duct measures 11 mm in diameter. There is a mildly dilated cystic duct remnant. No evidence of choledocholithiasis. Pancreas:  Unremarkable. No pancreatic ductal dilatation or surrounding inflammatory changes. Spleen: Normal in size without focal abnormality. Adrenals/Urinary Tract: Both adrenal glands appear normal. The kidneys and ureters appear normal. Stomach/Bowel: No evidence of bowel wall thickening, distention or surrounding inflammatory change.Stable postoperative changes in the left upper quadrant consistent with previous gastric bypass. Vascular/Lymphatic: There are no enlarged abdominal lymph nodes. No significant vascular findings are present. Other: The visualized abdominal wall is intact. No ascites. Mild dependent subcutaneous edema in the back. Musculoskeletal: Status post T12 spinal augmentation. No acute osseous findings. IMPRESSION: 1. Stable mild biliary dilatation post cholecystectomy, without evidence of choledocholithiasis, likely physiologic. 2. No acute or focal hepatic abnormalities identified. 3. Previous gastric bypass. Electronically Signed   By: Carey Bullocks M.D.   On: 08/28/2019 14:17   Dg Chest Port 1 View  Result Date: 08/30/2019 CLINICAL DATA:  32 year old female with a history shortness of breath EXAM: PORTABLE CHEST 1 VIEW COMPARISON:  August 25, 2019 FINDINGS: Cardiomediastinal silhouette within normal limits in size and contour. No evidence of central vascular congestion. Interval placement right upper extremity PICC with the tip appearing to terminate superior vena cava. No confluent airspace disease pneumothorax or pleural effusion. IMPRESSION: Negative for acute cardiopulmonary disease. Right upper extremity PICC Electronically Signed   By: Gilmer Mor D.O.   On: 08/30/2019 14:42   Dg Chest Portable 1 View  Result Date: 08/23/2019 CLINICAL DATA:  Shortness of breath EXAM: PORTABLE CHEST 1 VIEW COMPARISON:  08/16/2019 FINDINGS: The heart size and mediastinal contours are within normal limits. Both lungs are clear. The visualized skeletal structures are unremarkable. Mild patient  rotation to the right accentuates the mediastinal markings. IMPRESSION: No active disease. Electronically Signed   By: Alcide Clever M.D.   On: 08/23/2019 20:58   Dg Chest Portable 1 View  Result Date: 08/16/2019 CLINICAL DATA:  Shortness of breath, chest pain EXAM: PORTABLE CHEST 1 VIEW COMPARISON:  04/21/2019 FINDINGS: The heart size and mediastinal contours are within normal limits. Both lungs are clear. The visualized skeletal structures are unremarkable. IMPRESSION: No acute abnormality of the lungs in AP portable projection. Electronically Signed   By: Lauralyn Primes M.D.   On: 08/16/2019 21:17   [name] Ekg Site Rite  Result Date: 08/29/2019 If Site Rite image not attached, placement could not be confirmed due to current cardiac rhythm.    Microbiology: Recent Results (from the past 240 hour(s))  SARS CORONAVIRUS 2 (Garnetta Fedrick 6-24 HRS) Nasopharyngeal Nasopharyngeal Swab     Status: None   Collection Time: 08/27/19  3:42 PM   Specimen: Nasopharyngeal Swab  Result Value Ref Range Status   SARS Coronavirus 2 NEGATIVE NEGATIVE Final    Comment: (NOTE) SARS-CoV-2 target nucleic acids are NOT DETECTED. The SARS-CoV-2 RNA is generally detectable in upper and lower respiratory specimens during the acute phase of infection. Negative results do not preclude SARS-CoV-2 infection, do not rule out co-infections with other pathogens, and should not be used as the sole basis for treatment or other patient management decisions. Negative results must be combined with clinical observations, patient history, and epidemiological information. The expected result is Negative. Fact Sheet for Patients: HairSlick.no Fact Sheet for Healthcare Providers: quierodirigir.com This test is not yet approved or cleared by the [place] FDA and  has been authorized for detection and/or diagnosis of SARS-CoV-2 by FDA under an Emergency Use Authorization (EUA). This  EUA will remain  in effect (meaning this test can be used) for the duration of the COVID-19 declaration under Section 56 4(b)(1) of the Act, 21 U.S.C. section 360bbb-3(b)(1), unless the authorization is terminated or revoked sooner. Performed at [facility], [address] [place], [place] [address]      Labs: Basic Metabolic Panel: Recent Labs  Lab 08/27/19 1257 08/28/19 0655 08/29/19 1858 08/30/19 0511 08/31/19 0543  NA 138 141 135 137 135  K 4.1 3.8 3.9 3.7 4.0  CL 113* 113* 106 108 106  CO2 16* 23 24 23 22   GLUCOSE 86 105* 92 94 100*  BUN 19 7 9 9 11   CREATININE 0.65 0.52 0.55 0.60 0.60  CALCIUM 8.8* 8.9 9.0 8.6* 8.8*  MG 2.1  --   --   --   --    Liver Function Tests: Recent Labs  Lab 08/27/19 1257 08/28/19 0655 08/29/19 1858 08/30/19 0511 08/31/19 0543  AST 720* 760* 99* 58* 132*  ALT 368* 794* 371* 289* 242*  ALKPHOS 88 143* 117 102 165*  BILITOT 0.8 0.6 0.4 0.4 0.6  PROT 7.1 6.5 6.6 6.1* 6.8  ALBUMIN 4.2 3.9 3.8 3.5 3.8   Recent Labs  Lab 08/25/19 1939 08/27/19 1257 08/30/19 0511  LIPASE 32 31 25   No results for input(s): AMMONIA in the last 168 hours. CBC: Recent Labs  Lab 08/25/19 1823 08/27/19 1257 08/28/19 0655 08/29/19 1858 08/30/19 0511  WBC 8.6 8.0 3.7* 8.6 7.4  NEUTROABS  --   --   --  4.2 3.3  HGB 14.0 13.5 13.0 13.1 12.5  HCT 41.3 39.3 40.9 40.3 38.1  MCV 94.3 93.3 98.1 96.6 97.2  PLT 279 224 206 229 220   Cardiac Enzymes: No results for input(s): CKTOTAL, CKMB, CKMBINDEX, TROPONINI in the last 168 hours. BNP: Invalid input(s): POCBNP CBG: No results for input(s): GLUCAP in the last 168 hours.  Time coordinating discharge:  36 minutes  Signed:  Catarina Hartshorn, DO [facility] Pager: [phone] 08/31/2019, 2:31 PM

## 2019-08-31 NOTE — Anesthesia Postprocedure Evaluation (Signed)
Anesthesia Post Note  [name]  Procedure(s) Performed: ESOPHAGOGASTRODUODENOSCOPY (EGD) WITH PROPOFOL (N/A )  Anesthesia Type: General Level of consciousness: awake and alert Pain management: pain level controlled Vital Signs Assessment: post-procedure vital signs reviewed and stable Respiratory status: spontaneous breathing Cardiovascular status: stable and blood pressure returned to baseline Postop Assessment: no apparent nausea or vomiting Anesthetic complications: no     Last Vitals:  Vitals:   08/31/19 0940 08/31/19 1045  BP: (!) 125/91 108/79  Pulse: 95 91  Resp: 18 (!) 26  Temp: 36.6 C (P) 36.5 C  SpO2: 95% 95%    Last Pain:  Vitals:   08/31/19 1015  TempSrc:   PainSc: 10-Worst pain ever                 [facility]

## 2019-08-31 NOTE — TOC Initial Note (Signed)
Transition of Care [facility]) - Initial/Assessment Note    Patient Details  Name: [name] [name] MRN: [id number] Date of Birth: January 10, 1986  [facility]) CM/SW Contact:    Boneta Lucks, RN Phone Number: 08/31/2019, 1:43 PM  Clinical Narrative:       Patient admitted for elevated Liver enzymes. Patient has high readmission score. Unable to reach patient x 2 days, Patient having EGD now, called Mother- [name] [name] for discharge planning. Expected discharge today or over the weekend. Depending on GI consult.  Patient lives at home with her mother, patient drives herself to appointments, no problems getting medications. Patient has file for disability in the past. Patient currently on workmen's comp for fall and back injury. No needs identified by CM or mother. TOC to follow for discharge instructions.              Expected Discharge Plan: Home/Self Care     Patient Goals and CMS Choice Patient states their goals for this hospitalization and ongoing recovery are:: to return home.      Expected Discharge Plan and Services Expected Discharge Plan: Home/Self Care       Prior Living Arrangements/Services   Lives with:: Parents Patient language and need for interpreter reviewed:: Yes        Need for Family Participation in Patient Care: Yes (Comment) Care giver support system in place?: Yes (comment)   Criminal Activity/Legal Involvement Pertinent to Current Situation/Hospitalization: No - Comment as needed  Activities of Daily Living Home Assistive Devices/Equipment: None ADL Screening (condition at time of admission) Patient's cognitive ability adequate to safely complete daily activities?: Yes Is the patient deaf or have difficulty hearing?: No Does the patient have difficulty seeing, even when wearing glasses/contacts?: No Does the patient have difficulty concentrating, remembering, or making decisions?: No Patient able to express need for assistance with ADLs?:  Yes Does the patient have difficulty dressing or bathing?: No Independently performs ADLs?: Yes (appropriate for developmental age) Does the patient have difficulty walking or climbing stairs?: No Weakness of Legs: None Weakness of Arms/Hands: None    Alcohol / Substance Use: Not Applicable Psych Involvement: No (comment)  Admission diagnosis:  severe abdomen pain Patient Active Problem List   Diagnosis Date Noted  . Cannabis abuse 08/30/2019  . Dyspnea   . Severe anxiety 08/21/2019  . Hematemesis with nausea 04/23/2019  . Absolute anemia 04/23/2019  . Urge incontinence 10/26/2018  . S/P kyphoplasty 08/02/2018  . Herpes genitalis in women 12/19/2017  . Abnormal urine odor 03/30/2017  . Urinary tract infection with hematuria 03/30/2017  . Screening for genitourinary condition 03/30/2017  . Pain 03/30/2017  . Burning with urination 03/30/2017  . Family history of breast cancer in mother,uncertain BR CA status 02/28/2017  . Fibroid 01/18/2017  . Chronic pain syndrome 12/05/2016  . Folate deficiency 12/04/2016  . Nausea vomiting and diarrhea 12/04/2016  . Severe anemia 12/04/2016  . Symptomatic anemia 12/03/2016  . Complaints of leg weakness   . Paresthesia   . Left-sided weakness   . Uncontrolled pain 01/24/2016  . Malnutrition of moderate degree 01/24/2016  . Neuropathy 01/23/2016  . Inability to walk 01/23/2016  . Paresthesias   . Bilateral leg numbness 11/26/2015  . 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 Post partum depression 05/09/2015  . Anastomotic ulcer   . Dysphagia   . Hematemesis 04/07/2015  . Intractable vomiting with nausea 04/07/2015  . Elevated liver enzymes   . Epigastric pain   .  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 Seizure-like activity ([place]) 02/22/2015  . Fibromyalgia 02/18/2015  . Vulvar fissure 02/18/2015  . Clinical depression 02/01/2015  . Current smoker 01/29/2015  . Current tobacco use  01/29/2015  . 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 Leg weakness, bilateral 01/02/2015  . Gait disorder 01/02/2015  . Other symptoms and signs involving the musculoskeletal system 01/02/2015  . Abnormal gait 01/02/2015  . Cervicalgia 12/18/2014  . Sciatica of left side 11/14/2014  . Neuralgia neuritis, sciatic nerve 11/14/2014  . 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 Encounter for sterilization 09/09/2014  . Abdominal pain, acute, right lower quadrant 08/22/2014  . Headache, migraine 08/19/2014  . Seizure ([place]) 08/18/2014  . Encephalopathy 08/13/2014  . Headache 08/13/2014  . Altered mental status 08/13/2014  . Right leg numbness 07/31/2014  . Disturbance of skin sensation 07/31/2014  . Hypertension in pregnancy, transient 07/08/2014  . Previous gastric bypass affecting pregnancy, antepartum 05/22/2014  . 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 ASD (atrial septal defect), ostium secundum 05/17/2014  . Depression complicating pregnancy in second trimester, antepartum 05/09/2014  . Antepartum mental disorder in pregnancy 05/09/2014  . Rapid palpitations 02/18/2014  . H/O maternal third degree perineal laceration, currently pregnant 02/18/2014  . High-risk pregnancy 02/18/2014  . Supervision of pregnancy with other poor reproductive or obstetric history, unspecified trimester 02/18/2014  . Restless legs 01/16/2014  . Restless leg 01/16/2014  . Chronic interstitial cystitis 10/23/2013  . Menorrhagia 07/18/2013  . Excessive and frequent menstruation 07/18/2013  . h/o Opiate addiction 03/11/2012    Class: Acute  . History of migraine headaches 03/10/2012    Class: Acute  . Depression with anxiety 03/10/2012    Class: Chronic  . 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 Panic disorder without agoraphobia  03/10/2012  . H/O disease 03/10/2012  . Dysthymia 03/10/2012  . Pelvic congestion syndrome 10/13/2011  . Coitalgia 10/13/2011  . Chronic migraine without aura 10/13/2011  . Unspecified dyspareunia 10/13/2011  . IBS (irritable bowel syndrome) 08/25/2011  . Diarrhea 05/27/2011  . OBESITY, UNSPECIFIED 09/17/2010  . Transaminitis 09/17/2010  . IDA (iron deficiency anemia) 07/23/2010   PCP:  Kathyrn Drown, MD Pharmacy:   [facility], [place] [address] Phone: [phone] Fax: [phone]  [id number] [place], [address] [place] [address] Phone: [phone] Fax: [phone]     Social Determinants of Health (SDOH) Interventions    Readmission Risk Interventions Readmission Risk Prevention Plan 08/31/2019  Transportation Screening Complete  PCP or Specialist Appt within 3-5 Days Not Complete  HRI or [place] Complete  Social Work Consult for [place] Planning/Counseling Complete  Palliative Care Screening Not Complete  Medication Review Press photographer) Complete  Some recent data might be hidden

## 2019-08-31 NOTE — Progress Notes (Signed)
Nsg Discharge Note  Admit Date:  08/27/2019 Discharge date: 08/31/2019   Penny [name] to be D/C'd Home per MD order.  AVS completed.  Copy for chart, and copy for patient signed, and dated. Patient/caregiver able to verbalize understanding.  Discharge Medication: Allergies as of 08/31/2019      Reactions   Gabapentin Other (See Comments)   Pt states that she was unresponsive after taking this medication, but her vitals remained stable.     Metoclopramide Hcl Anxiety, Other (See Comments)   Pt states that she felt like she was trapped in a box, and could not get out.  Pt also states that she had temporary loss of movement, weakness, and tingling.     Tramadol Other (See Comments)   Seizures   Ativan [lorazepam] Other (See Comments)   combative   Latex Itching, Rash, Other (See Comments)   Burns   Lyrica [pregabalin] Other (See Comments)   suicidal   Trazodone And Nefazodone Other (See Comments)   Makes pt "like a zombie"   Zofran [ondansetron] Other (See Comments)   Migraines in high doses    Sulfa Antibiotics Hives   Butrans [buprenorphine] Rash   Patch caused rash, didn't help with pain   Nucynta [tapentadol] Nausea And Vomiting   Propofol Nausea And Vomiting   Tape Itching, Rash, Other (See Comments)   Please use paper tape   Toradol [ketorolac Tromethamine] Anxiety      Medication List    STOP taking these medications   cyclobenzaprine 5 MG tablet Commonly known as: FLEXERIL   predniSONE 20 MG tablet Commonly known as: DELTASONE     TAKE these medications   amphetamine-dextroamphetamine 30 MG 24 hr capsule Commonly known as: Adderall XR TAKE (1) CAPSULE BY MOUTH EVERY DAY. What changed:   how much to take  how to take this  when to take this  additional instructions   DULoxetine 60 MG capsule Commonly known as: CYMBALTA Take 1 capsule (60 mg total) by mouth 2 (two) times daily.   ondansetron 8 MG disintegrating tablet Commonly known as:  Zofran ODT Take 1 tablet (8 mg total) by mouth every 8 (eight) hours as needed for nausea or vomiting. What changed: Another medication with the same name was added. Make sure you understand how and when to take each.   ondansetron 4 MG disintegrating tablet Commonly known as: ZOFRAN-ODT Take 1 tablet (4 mg total) by mouth every 8 (eight) hours as needed for nausea or vomiting. What changed: You were already taking a medication with the same name, and this prescription was added. Make sure you understand how and when to take each.   oxyCODONE 5 MG immediate release tablet Commonly known as: Oxy IR/ROXICODONE Take 1 tablet (5 mg total) by mouth every 6 (six) hours as needed for moderate pain.   pantoprazole 40 MG tablet Commonly known as: PROTONIX Take 1 tablet (40 mg total) by mouth 2 (two) times daily before a meal. What changed: when to take this   ProAir HFA 108 (90 Base) MCG/ACT inhaler Generic drug: albuterol INHALE 2 PUFFS INTO THE LUNGS EVERY 6 HOURS AS NEEDED. What changed: See the new instructions.   albuterol 108 (90 Base) MCG/ACT inhaler Commonly known as: VENTOLIN HFA Inhale 2 puffs into the lungs every 6 (six) hours as needed for wheezing or shortness of breath. What changed: You were already taking a medication with the same name, and this prescription was added. Make sure you understand how and when  to take each.   promethazine 25 MG tablet Commonly known as: PHENERGAN TAKE ONE TABLET BY MOUTH EVERY 6 HOURS AS NEEDED. What changed: reasons to take this   Pyridium 100 MG tablet Generic drug: phenazopyridine Take 100 mg by mouth 3 (three) times daily as needed for pain.   tiZANidine 4 MG tablet Commonly known as: ZANAFLEX Take 4 mg by mouth 4 (four) times daily as needed for muscle spasms.   topiramate 50 MG tablet Commonly known as: TOPAMAX TAKE ONE IN THE AM AND 2 AT NIGHT What changed: See the new instructions.   valACYclovir 1000 MG tablet Commonly  known as: VALTREX Take 1 bid for 10 days then Take  1 daily for suppression What changed:   how much to take  how to take this  when to take this  additional instructions   zolpidem 10 MG tablet Commonly known as: Ambien Take 1 tablet (10 mg total) by mouth at bedtime.       Discharge Assessment: Vitals:   08/31/19 1045 08/31/19 1224  BP: 108/79 112/73  Pulse: 91 93  Resp: (!) 26 20  Temp: 97.7 F (36.5 C) 98.2 F (36.8 C)  SpO2: 95% 94%   Skin clean, dry and intact without evidence of skin break down, no evidence of skin tears noted. IV catheter discontinued intact. Site without signs and symptoms of complications - no redness or edema noted at insertion site, patient denies c/o pain - only slight tenderness at site.  Dressing with slight pressure applied.  D/c Instructions-Education: Discharge instructions given to patient/family with verbalized understanding. D/c education completed with patient/family including follow up instructions, medication list, d/c activities limitations if indicated, with other d/c instructions as indicated by MD - patient able to verbalize understanding, all questions fully answered. Patient instructed to return to ED, call 911, or call MD for any changes in condition.  Patient escorted via [place], and D/C home via private auto.  Dorcas Mcmurray, RN 08/31/2019 5:23 PM  Nsg Discharge Note  Admit Date:  08/27/2019 Discharge date: 08/31/2019   Penny [name] to be D/C'd Home per MD order.  AVS completed.  Copy for chart, and copy for patient signed, and dated. Patient/caregiver able to verbalize understanding.  Discharge Medication: Allergies as of 08/31/2019      Reactions   Gabapentin Other (See Comments)   Pt states that she was unresponsive after taking this medication, but her vitals remained stable.     Metoclopramide Hcl Anxiety, Other (See Comments)   Pt states that she felt like she was trapped in a box, and could not get  out.  Pt also states that she had temporary loss of movement, weakness, and tingling.     Tramadol Other (See Comments)   Seizures   Ativan [lorazepam] Other (See Comments)   combative   Latex Itching, Rash, Other (See Comments)   Burns   Lyrica [pregabalin] Other (See Comments)   suicidal   Trazodone And Nefazodone Other (See Comments)   Makes pt "like a zombie"   Zofran [ondansetron] Other (See Comments)   Migraines in high doses    Sulfa Antibiotics Hives   Butrans [buprenorphine] Rash   Patch caused rash, didn't help with pain   Nucynta [tapentadol] Nausea And Vomiting   Propofol Nausea And Vomiting   Tape Itching, Rash, Other (See Comments)   Please use paper tape   Toradol [ketorolac Tromethamine] Anxiety      Medication List    STOP taking  these medications   cyclobenzaprine 5 MG tablet Commonly known as: FLEXERIL   predniSONE 20 MG tablet Commonly known as: DELTASONE     TAKE these medications   amphetamine-dextroamphetamine 30 MG 24 hr capsule Commonly known as: Adderall XR TAKE (1) CAPSULE BY MOUTH EVERY DAY. What changed:   how much to take  how to take this  when to take this  additional instructions   DULoxetine 60 MG capsule Commonly known as: CYMBALTA Take 1 capsule (60 mg total) by mouth 2 (two) times daily.   ondansetron 8 MG disintegrating tablet Commonly known as: Zofran ODT Take 1 tablet (8 mg total) by mouth every 8 (eight) hours as needed for nausea or vomiting. What changed: Another medication with the same name was added. Make sure you understand how and when to take each.   ondansetron 4 MG disintegrating tablet Commonly known as: ZOFRAN-ODT Take 1 tablet (4 mg total) by mouth every 8 (eight) hours as needed for nausea or vomiting. What changed: You were already taking a medication with the same name, and this prescription was added. Make sure you understand how and when to take each.   oxyCODONE 5 MG immediate release  tablet Commonly known as: Oxy IR/ROXICODONE Take 1 tablet (5 mg total) by mouth every 6 (six) hours as needed for moderate pain.   pantoprazole 40 MG tablet Commonly known as: PROTONIX Take 1 tablet (40 mg total) by mouth 2 (two) times daily before a meal. What changed: when to take this   ProAir HFA 108 (90 Base) MCG/ACT inhaler Generic drug: albuterol INHALE 2 PUFFS INTO THE LUNGS EVERY 6 HOURS AS NEEDED. What changed: See the new instructions.   albuterol 108 (90 Base) MCG/ACT inhaler Commonly known as: VENTOLIN HFA Inhale 2 puffs into the lungs every 6 (six) hours as needed for wheezing or shortness of breath. What changed: You were already taking a medication with the same name, and this prescription was added. Make sure you understand how and when to take each.   promethazine 25 MG tablet Commonly known as: PHENERGAN TAKE ONE TABLET BY MOUTH EVERY 6 HOURS AS NEEDED. What changed: reasons to take this   Pyridium 100 MG tablet Generic drug: phenazopyridine Take 100 mg by mouth 3 (three) times daily as needed for pain.   tiZANidine 4 MG tablet Commonly known as: ZANAFLEX Take 4 mg by mouth 4 (four) times daily as needed for muscle spasms.   topiramate 50 MG tablet Commonly known as: TOPAMAX TAKE ONE IN THE AM AND 2 AT NIGHT What changed: See the new instructions.   valACYclovir 1000 MG tablet Commonly known as: VALTREX Take 1 bid for 10 days then Take  1 daily for suppression What changed:   how much to take  how to take this  when to take this  additional instructions   zolpidem 10 MG tablet Commonly known as: Ambien Take 1 tablet (10 mg total) by mouth at bedtime.       Discharge Assessment: Vitals:   08/31/19 1045 08/31/19 1224  BP: 108/79 112/73  Pulse: 91 93  Resp: (!) 26 20  Temp: 97.7 F (36.5 C) 98.2 F (36.8 C)  SpO2: 95% 94%   Skin clean, dry and intact without evidence of skin break down, no evidence of skin tears noted. IV catheter  discontinued intact. Site without signs and symptoms of complications - no redness or edema noted at insertion site, patient denies c/o pain - only slight tenderness at site.  Dressing with slight pressure applied.  D/c Instructions-Education: Discharge instructions given to patient/family with verbalized understanding. D/c education completed with patient/family including follow up instructions, medication list, d/c activities limitations if indicated, with other d/c instructions as indicated by MD - patient able to verbalize understanding, all questions fully answered. Patient instructed to return to ED, call 911, or call MD for any changes in condition.  Patient escorted via [place], and D/C home via private auto.  Dorcas Mcmurray, LPN [phone] [id number] PM

## 2019-09-03 ENCOUNTER — Telehealth: Payer: Self-pay | Admitting: Family Medicine

## 2019-09-03 ENCOUNTER — Encounter (HOSPITAL_COMMUNITY): Payer: Self-pay | Admitting: Internal Medicine

## 2019-09-03 LAB — ANTI-SMOOTH MUSCLE ANTIBODY, IGG: F-Actin IgG: 5 Units (ref 0–19)

## 2019-09-03 LAB — MITOCHONDRIAL ANTIBODIES: Mitochondrial M2 Ab, IgG: <20 Units (ref 0.0–20.0)

## 2019-09-03 NOTE — Telephone Encounter (Signed)
Please call and schedule

## 2019-09-03 NOTE — Telephone Encounter (Signed)
Pt states she was discharged on Saturday from hospital and wants to make a hospital follow up. She states dr Nicki Reaper needs to read over her notes before she is seen

## 2019-09-03 NOTE — Telephone Encounter (Signed)
May have a transitional follow-up hospital office visit later this week

## 2019-09-03 NOTE — Telephone Encounter (Signed)
Wouldn't say, just said she wanted to talk to the nurse. Sorry.Marland KitchenMarland KitchenMarland Kitchen

## 2019-09-04 ENCOUNTER — Telehealth: Payer: Self-pay | Admitting: Family Medicine

## 2019-09-04 DIAGNOSIS — R109 Unspecified abdominal pain: Secondary | ICD-10-CM

## 2019-09-04 DIAGNOSIS — R7401 Elevation of levels of liver transaminase levels: Secondary | ICD-10-CM

## 2019-09-04 NOTE — Telephone Encounter (Signed)
Please initiate referral in system for GI - Dr. Gayleen Orem  Pt has [facility] & we as her PCP have to send referral, see note below for reason    Dx: elevated transaminases, abdominal pain. Dr Laural Golden wants her to see Dr Gayleen Orem, he has already spoke to Dr Lysle Rubens. He had me fax her records so they should have those.

## 2019-09-04 NOTE — Telephone Encounter (Signed)
Referral put in.

## 2019-09-05 ENCOUNTER — Encounter: Payer: Self-pay | Admitting: Family Medicine

## 2019-09-05 ENCOUNTER — Other Ambulatory Visit: Payer: Self-pay | Admitting: Family Medicine

## 2019-09-05 ENCOUNTER — Other Ambulatory Visit: Payer: Self-pay

## 2019-09-05 ENCOUNTER — Ambulatory Visit (INDEPENDENT_AMBULATORY_CARE_PROVIDER_SITE_OTHER): Payer: Medicaid Other | Admitting: Family Medicine

## 2019-09-05 VITALS — BP 112/74 | Temp 98.2°F | Ht 66.0 in | Wt 192.0 lb

## 2019-09-05 DIAGNOSIS — R0609 Other forms of dyspnea: Secondary | ICD-10-CM | POA: Diagnosis not present

## 2019-09-05 DIAGNOSIS — R740 Nonspecific elevation of levels of transaminase and lactic acid dehydrogenase [LDH]: Secondary | ICD-10-CM | POA: Diagnosis not present

## 2019-09-05 DIAGNOSIS — R7401 Elevation of levels of liver transaminase levels: Secondary | ICD-10-CM

## 2019-09-05 DIAGNOSIS — R0600 Dyspnea, unspecified: Secondary | ICD-10-CM

## 2019-09-05 DIAGNOSIS — R109 Unspecified abdominal pain: Secondary | ICD-10-CM

## 2019-09-05 DIAGNOSIS — K834 Spasm of sphincter of Oddi: Secondary | ICD-10-CM

## 2019-09-05 MED ORDER — OXYCODONE HCL 5 MG PO TABS: 5.0000 mg | ORAL_TABLET | Freq: Four times a day (QID) | ORAL | 0 refills | Status: DC | PRN

## 2019-09-05 NOTE — Progress Notes (Signed)
Subjective:    Patient ID: Penny [name], female    DOB: May 28, 1986, 33 y.o.   MRN: [id number]  HPIfollow up hospitalization.  Pt states she is having sob but was this way in the hospital.  Time was spent today going over her liver enzymes lab work ultrasound scans consults with gastroenterology and hospitalist notes Patient states she is having severe stabbing pains in the liver region but has had some nausea with some vomiting since coming home from the hospital but is trying to tolerate she does have a history of overuse of narcotic medications but she denies abusing the medication currently request to have some more medication.  She states that she would not take this around the same time as any of her nerve medication and she states she will stop the Ambien while on the medication.  I did explain to her how we would not be able to prescribe ongoing.  I also explained to her that the best path for her was to see the specialist at [facility] and have this problem   30 minutes spent with patient Review of Systems  Constitutional: Negative for activity change and fever.  HENT: Negative for congestion, ear pain and rhinorrhea.   Eyes: Negative for discharge.  Respiratory: Positive for shortness of breath. Negative for cough and wheezing.   Cardiovascular: Negative for chest pain.  Gastrointestinal: Positive for abdominal pain. Negative for constipation and diarrhea.   30 minutes spent with patient discussing her hospitalization the results of the test as well as discussing sphincter of Oddi and referral to [facility] GI was working on the referral    Objective:   Physical Exam  Lungs clear respiratory rate normal heart regular no murmurs abdomen no guarding rebound or tenderness extremities no edema     We will go ahead and order lab work to look at her liver enzymes kidney function because of previous abnormalities Assessment & Plan:  Transitional care Sphincter of Oddi dysfunction Short  course of oxycodone prescribed to the patient we will not prescribe this long-term patient was told not to take Ambien at nighttime while on this medicine.  She was told to not use the medicine more than 4 times per day.  She was also told not to combine it with other drugs or illegal drugs or marijuana.  The patient relates she is having moderate wheezing intermittently and shortness of breath she would like to see a asthma specialist to see if she may have possibility of asthma  In the hospital they did do d-dimer it was negative so therefore pulmonary embolus is less likely

## 2019-09-06 ENCOUNTER — Telehealth (HOSPITAL_COMMUNITY): Payer: Self-pay

## 2019-09-06 ENCOUNTER — Other Ambulatory Visit (HOSPITAL_COMMUNITY): Payer: Self-pay | Admitting: Psychiatry

## 2019-09-06 NOTE — Telephone Encounter (Signed)
Patient called and stated that while she was in the hospital on 9/21 they discontinued the Klonopin that she was on. She stated that she's interested in trying Xanax Bid sent to [facility] on [address] in [place]. She stated it's the closest thing she could think of to the Klonopin she was on. Please review and advise. Thank you.

## 2019-09-06 NOTE — Telephone Encounter (Signed)
I don't see any indication in the record that clonazepam was stopped. She had it filled on 08/14/19 so she should have enough until 09/13/19. I will not be prescribing xanax due to addiction potential

## 2019-09-10 ENCOUNTER — Other Ambulatory Visit: Payer: Self-pay

## 2019-09-10 ENCOUNTER — Telehealth (INDEPENDENT_AMBULATORY_CARE_PROVIDER_SITE_OTHER): Payer: Self-pay | Admitting: *Deleted

## 2019-09-10 ENCOUNTER — Encounter (HOSPITAL_COMMUNITY): Payer: Self-pay

## 2019-09-10 ENCOUNTER — Emergency Department (HOSPITAL_COMMUNITY): Payer: Medicaid Other

## 2019-09-10 ENCOUNTER — Emergency Department (HOSPITAL_COMMUNITY)
Admission: EM | Admit: 2019-09-10 | Discharge: 2019-09-10 | Disposition: A | Discharge status: Home / Self Care | Payer: Medicaid Other | Attending: Emergency Medicine | Admitting: Emergency Medicine

## 2019-09-10 DIAGNOSIS — Z91040 Latex allergy status: Secondary | ICD-10-CM | POA: Insufficient documentation

## 2019-09-10 DIAGNOSIS — R0789 Other chest pain: Secondary | ICD-10-CM | POA: Diagnosis not present

## 2019-09-10 DIAGNOSIS — R1010 Upper abdominal pain, unspecified: Secondary | ICD-10-CM | POA: Diagnosis not present

## 2019-09-10 DIAGNOSIS — R0602 Shortness of breath: Secondary | ICD-10-CM | POA: Insufficient documentation

## 2019-09-10 DIAGNOSIS — Z79899 Other long term (current) drug therapy: Secondary | ICD-10-CM | POA: Insufficient documentation

## 2019-09-10 DIAGNOSIS — R109 Unspecified abdominal pain: Secondary | ICD-10-CM

## 2019-09-10 DIAGNOSIS — Z87891 Personal history of nicotine dependence: Secondary | ICD-10-CM | POA: Insufficient documentation

## 2019-09-10 LAB — CBC WITH DIFFERENTIAL/PLATELET
Abs Immature Granulocytes: 0.02 10*3/uL (ref 0.00–0.07)
Basophils Absolute: 0 10*3/uL (ref 0.0–0.1)
Basophils Relative: 0 %
Eosinophils Absolute: 1.2 10*3/uL — ABNORMAL HIGH (ref 0.0–0.5)
Eosinophils Relative: 14 %
HCT: 39.4 % (ref 36.0–46.0)
Hemoglobin: 12.5 g/dL (ref 12.0–15.0)
Immature Granulocytes: 0 %
Lymphocytes Relative: 26 %
Lymphs Abs: 2.2 10*3/uL (ref 0.7–4.0)
MCH: 32.2 pg (ref 26.0–34.0)
MCHC: 31.7 g/dL (ref 30.0–36.0)
MCV: 101.5 fL — ABNORMAL HIGH (ref 80.0–100.0)
Monocytes Absolute: 0.8 10*3/uL (ref 0.1–1.0)
Monocytes Relative: 9 %
Neutro Abs: 4.4 10*3/uL (ref 1.7–7.7)
Neutrophils Relative %: 51 %
Platelets: 197 10*3/uL (ref 150–400)
RBC: 3.88 MIL/uL (ref 3.87–5.11)
RDW: 13 % (ref 11.5–15.5)
WBC: 8.7 10*3/uL (ref 4.0–10.5)
nRBC: 0 % (ref 0.0–0.2)

## 2019-09-10 LAB — COMPREHENSIVE METABOLIC PANEL
ALT: 23 U/L (ref 0–44)
AST: 17 U/L (ref 15–41)
Albumin: 4.1 g/dL (ref 3.5–5.0)
Alkaline Phosphatase: 83 U/L (ref 38–126)
Anion gap: 6 (ref 5–15)
BUN: 9 mg/dL (ref 6–20)
CO2: 19 mmol/L — ABNORMAL LOW (ref 22–32)
Calcium: 8.8 mg/dL — ABNORMAL LOW (ref 8.9–10.3)
Chloride: 114 mmol/L — ABNORMAL HIGH (ref 98–111)
Creatinine, Ser: 0.57 mg/dL (ref 0.44–1.00)
GFR calc Af Amer: >60 mL/min (ref >60)
GFR calc non Af Amer: >60 mL/min (ref >60)
Glucose, Bld: 93 mg/dL (ref 70–99)
Potassium: 3.9 mmol/L (ref 3.5–5.1)
Sodium: 139 mmol/L (ref 135–145)
Total Bilirubin: 0.7 mg/dL (ref 0.3–1.2)
Total Protein: 7 g/dL (ref 6.5–8.1)

## 2019-09-10 LAB — URINALYSIS, ROUTINE W REFLEX MICROSCOPIC
Bilirubin Urine: NEGATIVE
Glucose, UA: NEGATIVE mg/dL
Hgb urine dipstick: NEGATIVE
Ketones, ur: NEGATIVE mg/dL
Leukocytes,Ua: NEGATIVE
Nitrite: NEGATIVE
Protein, ur: NEGATIVE mg/dL
Specific Gravity, Urine: 1.005 (ref 1.005–1.030)
pH: 7 (ref 5.0–8.0)

## 2019-09-10 LAB — D-DIMER, QUANTITATIVE: D-Dimer, Quant: <0.27 ug/mL-FEU (ref 0.00–0.50)

## 2019-09-10 LAB — TROPONIN I (HIGH SENSITIVITY): Troponin I (High Sensitivity): <2 ng/L (ref <18)

## 2019-09-10 LAB — PREGNANCY, URINE: Preg Test, Ur: NEGATIVE

## 2019-09-10 LAB — LIPASE, BLOOD: Lipase: 32 U/L (ref 11–51)

## 2019-09-10 MED ORDER — HYDROMORPHONE HCL 1 MG/ML IJ SOLN
1.0000 mg | Freq: Once | INTRAMUSCULAR | Status: AC
  Administered 2019-09-10: 1 mg via INTRAVENOUS
  Filled 2019-09-10: qty 1

## 2019-09-10 MED ORDER — PROMETHAZINE HCL 25 MG/ML IJ SOLN
12.5000 mg | Freq: Once | INTRAMUSCULAR | Status: AC
  Administered 2019-09-10: 12.5 mg via INTRAVENOUS
  Filled 2019-09-10: qty 1

## 2019-09-10 MED ORDER — SODIUM CHLORIDE 0.9 % IV BOLUS
1000.0000 mL | Freq: Once | INTRAVENOUS | Status: AC
  Administered 2019-09-10: 1000 mL via INTRAVENOUS

## 2019-09-10 MED ORDER — OXYCODONE HCL 5 MG PO TABS
10.0000 mg | ORAL_TABLET | Freq: Once | ORAL | Status: AC
  Administered 2019-09-10: 10 mg via ORAL
  Filled 2019-09-10: qty 2

## 2019-09-10 MED ORDER — OXYCODONE HCL 5 MG PO TABS: 5.0000 mg | ORAL_TABLET | ORAL | 0 refills | Status: DC | PRN

## 2019-09-10 MED ORDER — HYDROMORPHONE HCL 1 MG/ML IJ SOLN
1.0000 mg | Freq: Once | INTRAMUSCULAR | Status: DC
  Filled 2019-09-10: qty 1

## 2019-09-10 NOTE — ED Provider Notes (Signed)
Ultrasound ED Peripheral IV (Provider)  Date/Time: 09/10/2019 7:10 PM Performed by: Maudie Flakes, MD Authorized by: Maudie Flakes, MD   Procedure details:    Indications: multiple failed IV attempts     Skin Prep: chlorhexidine gluconate     Location: Left basilic vein.   Angiocath:  20 G   Bedside Ultrasound Guided: Yes     Images: not archived     Patient tolerated procedure without complications: Yes     Dressing applied: Yes        Maudie Flakes, MD 09/10/19 1911

## 2019-09-10 NOTE — Telephone Encounter (Signed)
Penny [name] called the office this morning. She states that she was having the following symptoms and had been since Saturday and that they were getting worse. Trouble breathing , wheezing, denies fever ,coughing , abdominal pain under her left and right breast , N&V , no diarrhea. Chest pain real bad.  She is in the bed and unable to get up .   Discussed with Dr.Rehman and he ask that she go to the ED for evaluation. Patient called and advised. She says that she will go.

## 2019-09-10 NOTE — Discharge Instructions (Addendum)
You were seen in the emergency department today for abdominal pain.  Your labs and imaging were reassuring.  We would like you to continue your At Home medicines.  Please call Dr. Olevia Perches office first thing tomorrow morning for close follow-up.    We have sent you in a short course of oxycodone.   -Oxycodone-this is a narcotic/controlled substance medication that has potential addicting qualities.  We recommend that you take 1 tablet every 4 hours as needed for severe pain.  Do not drive or operate heavy machinery when taking this medicine as it can be sedating. Do not drink alcohol or take other sedating medications when taking this medicine for safety reasons.  Keep this out of reach of small children.    We have prescribed you new medication(s) today. Discuss the medications prescribed today with your pharmacist as they can have adverse effects and interactions with your other medicines including over the counter and prescribed medications. Seek medical evaluation if you start to experience new or abnormal symptoms after taking one of these medicines, seek care immediately if you start to experience difficulty breathing, feeling of your throat closing, facial swelling, or rash as these could be indications of a more serious allergic reaction  Return to the ER for new or worsening symptoms including but not limited to pain, fever, inability to keep fluids down, blood in stool or vomit, or central breathing, or any other concerns.

## 2019-09-10 NOTE — ED Triage Notes (Signed)
Pt presents to ED with complaints of SOB, severe abdominal pain which has been going on for 2 weeks. Sent by Dr Matthew Saras.

## 2019-09-10 NOTE — ED Provider Notes (Signed)
[facility] EMERGENCY DEPARTMENT Provider Note   CSN: [id number] Arrival date & time: 09/10/19  1511    History   Chief Complaint Chief Complaint  Patient presents with  . Shortness of Breath    HPI Penny [name] is a 33 y.o. female with a hx of seizures & pseudoseizures, anemia, anxiety, depression, fibromyalgia, substance abuse, prior gastric bypass, lupus, migraines, GERD, cholecystectomy, & chronic pain syndrome who presents to the ED @ the direction of her outpatient GI team for evaluation of abdominal pain & dyspnea that returned 3 days prior. Patient states she is having pain to the upper abdomen that radiates into the chest & back, it feels like pressure, it is constant/severe, worse with a deep breath, movement, & with attempts to eat. Associated sxs include dyspnea secondary to the pressure, nausea, & vomiting (unable to tolerate PO). Last BM 3 days ago was a bit loose. Denies fever, chills, URI sxs, cough,  leg pain/swelling, hemoptysis,  recent long travel, hormone use, personal hx of cancer, or hx of DVT/PE.   Recent hospital admission 09/21-09/25 2020 for intractable abdominal pain & nausea-- CT A/P 09/19 negative for acute findings, RUQ [name] 09/21 negative for acute findings, MRCP 9/22 negative for choledocholithiasis or acute hepatic abnormality, EGD 08/31/19 with small anastomotic ulcer & patent gastrojujonostomy---> Gastroenterologist Dr. 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 for patient to have ERCP there. Ultimately patient states sxs returned, seem worse, more vomiting, called Dr. Olevia Perches office & was referred to the ED.       HPI  Past Medical History:  Diagnosis Date  . ADD (attention deficit disorder)   . Anemia 2011   2o to GASTRIC BYPASS  . Anginal pain ([place])   . Anxiety   . Blood transfusion without reported diagnosis   . Chronic daily headache   .  Depression   . Depression   . Dysmenorrhea 09/18/2013  . Dysrhythmia   . Elevated liver enzymes JUL 2011ALK PHOS 111-127 AST  143-267 ALT  213-321T BILI 0.6  ALB  3.7-4.06 Jun 2011 ALK PHOS 118 AST 24 ALT 42 T BILI 0.4 ALB 3.9  . Encounter for drug rehabilitation    at behavioral health for opioid addiction about 4 years ago  . Family history of adverse reaction to anesthesia    'dad had to be kept on pump for breathing for morphine'  . Fatty liver   . Fibroid 01/18/2017  . Fibromyalgia   . Gastric bypass status for obesity   . Gastritis JULY 2011  . Heart murmur   . 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 History of Holter monitoring   . Hx of opioid abuse ([place])    for about 4 years, about 4 years ago  . Interstitial cystitis   . Iron deficiency anemia 07/23/2010  . Irritable bowel syndrome 2012 DIARRHEA   JUN 2012 TTG IgA 14.9  . IUD FEB 2010  . Lupus ([place])   . Menorrhagia 07/18/2013  . Migraines   . Obesity (BMI 30-39.9) 2011 228 LBS BMI 36.8  . Ovarian cyst   . Patient desires pregnancy 09/18/2013  . Polyneuropathy   . PONV (postoperative nausea and vomiting) seizure post-operatively   seizure following ankle surgery  . Potassium (K) deficiency   . Pregnant 12/25/2013  . Psychiatric pseudoseizure   . RLQ abdominal pain 07/18/2013  . Sciatica of left side  11/14/2014  . 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 Stress 09/18/2013    Patient Active Problem List   Diagnosis Date Noted  . Gastroesophageal reflux disease without esophagitis   . Cannabis abuse 08/30/2019  . Dyspnea   . Severe anxiety 08/21/2019  . Hematemesis with nausea 04/23/2019  . Absolute anemia 04/23/2019  . Urge incontinence 10/26/2018  . S/P kyphoplasty 08/02/2018  . Herpes genitalis in women 12/19/2017  . Abnormal urine odor 03/30/2017  . Urinary tract infection with hematuria 03/30/2017  . Screening for genitourinary condition 03/30/2017  . Pain 03/30/2017  .  Burning with urination 03/30/2017  . Family history of breast cancer in mother,uncertain BR CA status 02/28/2017  . Fibroid 01/18/2017  . Chronic pain syndrome 12/05/2016  . Folate deficiency 12/04/2016  . Nausea vomiting and diarrhea 12/04/2016  . Severe anemia 12/04/2016  . Symptomatic anemia 12/03/2016  . Complaints of leg weakness   . Paresthesia   . Left-sided weakness   . Uncontrolled pain 01/24/2016  . Malnutrition of moderate degree 01/24/2016  . Neuropathy 01/23/2016  . Inability to walk 01/23/2016  . Paresthesias   . Bilateral leg numbness 11/26/2015  . 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 Post partum depression 05/09/2015  . Anastomotic ulcer   . Dysphagia   . Hematemesis 04/07/2015  . Intractable vomiting with nausea 04/07/2015  . Elevated liver enzymes   . Epigastric pain   . Pseudoseizure ([place]) 02/22/2015  . Seizure-like activity ([place]) 02/22/2015  . Fibromyalgia 02/18/2015  . Vulvar fissure 02/18/2015  . Clinical depression 02/01/2015  . Current smoker 01/29/2015  . Current tobacco use 01/29/2015  . 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 Leg weakness, bilateral 01/02/2015  . Gait disorder 01/02/2015  . Other symptoms and signs involving the musculoskeletal system 01/02/2015  . Abnormal gait 01/02/2015  . Cervicalgia 12/18/2014  . Sciatica of left side 11/14/2014  . Neuralgia neuritis, sciatic nerve 11/14/2014  . 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 Encounter for sterilization 09/09/2014  . Abdominal pain, acute, right lower quadrant 08/22/2014  . Headache, migraine 08/19/2014  . Seizure ([place]) 08/18/2014  . Encephalopathy 08/13/2014  . Headache 08/13/2014  . Altered mental status 08/13/2014  . Right leg numbness 07/31/2014  . Disturbance of skin sensation 07/31/2014  . Hypertension in pregnancy, transient 07/08/2014  . Previous gastric bypass affecting pregnancy, antepartum  05/22/2014  . 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 ASD (atrial septal defect), ostium secundum 05/17/2014  . Depression complicating pregnancy in second trimester, antepartum 05/09/2014  . Antepartum mental disorder in pregnancy 05/09/2014  . Rapid palpitations 02/18/2014  . H/O maternal third degree perineal laceration, currently pregnant 02/18/2014  . High-risk pregnancy 02/18/2014  . Supervision of pregnancy with other poor reproductive or obstetric history, unspecified trimester 02/18/2014  . Restless legs 01/16/2014  . Restless leg 01/16/2014  . Chronic interstitial cystitis 10/23/2013  . Menorrhagia 07/18/2013  . Excessive and frequent menstruation 07/18/2013  . h/o Opiate addiction 03/11/2012    Class: Acute  . History of migraine headaches 03/10/2012    Class: Acute  . Depression with anxiety 03/10/2012    Class: Chronic  . 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 Panic disorder without agoraphobia 03/10/2012  . H/O disease 03/10/2012  . Dysthymia 03/10/2012  . Pelvic congestion syndrome 10/13/2011  .  Coitalgia 10/13/2011  . Chronic migraine without aura 10/13/2011  . Unspecified dyspareunia 10/13/2011  . IBS (irritable bowel syndrome) 08/25/2011  . Diarrhea 05/27/2011  . OBESITY, UNSPECIFIED 09/17/2010  . Transaminitis 09/17/2010  . IDA (iron deficiency anemia) 07/23/2010    Past Surgical History:  Procedure Laterality Date  . BIOPSY  04/10/2015   Procedure: BIOPSY;  Surgeon: Daneil Dolin, MD;  Location: [facility];  Service: Endoscopy;;  . cath self every nite     for sodium bicarb injection (discontinued 2013)  . CHOLECYSTECTOMY  2005   biliary dyskinesia  . COLONOSCOPY  JUN 2012 ABD PN/DIARRHEA WITH PROPOFOL   NL COLON  . DILATION AND CURETTAGE OF UTERUS    . DILITATION & CURRETTAGE/HYSTROSCOPY WITH  NOVASURE ABLATION N/A 03/24/2017   Procedure: DILATATION & CURETTAGE/HYSTEROSCOPY WITH NOVASURE ENDOMETRIAL ABLATION;  Surgeon: Jonnie Kind, MD;  Location: [facility];  Service: Gynecology;  Laterality: N/A;  . ESOPHAGEAL DILATION N/A 04/10/2015   Procedure: ESOPHAGEAL DILATION WITH 54FR MALONEY DILATOR;  Surgeon: Daneil Dolin, MD;  Location: [facility];  Service: Endoscopy;  Laterality: N/A;  . ESOPHAGOGASTRODUODENOSCOPY    . ESOPHAGOGASTRODUODENOSCOPY (EGD) WITH PROPOFOL N/A 04/10/2015   Procedure: ESOPHAGOGASTRODUODENOSCOPY (EGD) WITH PROPOFOL;  Surgeon: Daneil Dolin, MD;  Location: [facility];  Service: Endoscopy;  Laterality: N/A;  . ESOPHAGOGASTRODUODENOSCOPY (EGD) WITH PROPOFOL N/A 12/06/2016   Procedure: ESOPHAGOGASTRODUODENOSCOPY (EGD) WITH PROPOFOL;  Surgeon: Danie Binder, MD;  [facility];  Service: Endoscopy;  Laterality: N/A;  . ESOPHAGOGASTRODUODENOSCOPY (EGD) WITH PROPOFOL N/A 04/27/2019   Procedure: ESOPHAGOGASTRODUODENOSCOPY (EGD) WITH PROPOFOL;  Surgeon: Rogene Houston, MD;  [facility]: [facility];  Service: Endoscopy;  Laterality: N/A;  730  . ESOPHAGOGASTRODUODENOSCOPY (EGD) WITH PROPOFOL N/A 08/31/2019   Procedure: ESOPHAGOGASTRODUODENOSCOPY (EGD) WITH PROPOFOL;  Surgeon: Rogene Houston, MD;  Location: [facility];  Service: Endoscopy;  Laterality: N/A;  . GAB  2007   in [place]-POUCH 5 CM  . GASTRIC BYPASS  06/2006  . HYSTEROSCOPY W/D&C N/A 09/12/2014   Procedure: DILATATION AND CURETTAGE /HYSTEROSCOPY;  Surgeon: Jonnie Kind, MD;  Location: [facility];  Service: Gynecology;  Laterality: N/A;  . KYPHOPLASTY N/A 08/02/2018   Procedure: KYPHOPLASTY T12;  Surgeon: Melina Schools, MD;  Location: [place];  Service: Orthopedics;  Laterality: N/A;  60 mins  . LAPAROSCOPIC TUBAL LIGATION Bilateral 03/24/2017   Procedure: LAPAROSCOPIC TUBAL LIGATION ([place] Rings);  Surgeon: Jonnie Kind, MD;  Location: [facility];  Service: Gynecology;  Laterality: Bilateral;  . REPAIR VAGINAL  CUFF N/A 07/30/2014   Procedure: REPAIR VAGINAL CUFF;  Surgeon: Mora Bellman, MD;  Location: [place] [facility];  Service: Gynecology;  Laterality: N/A;  . SAVORY DILATION  06/20/2012   Dr. Barnie Alderman gastritis/Ulcer in the mid jejunum. Empiric dilation.   . SURAL NERVE BX Left 02/25/2016   Procedure: LEFT SURAL NERVE BIOPSY;  Surgeon: Jovita Gamma, MD;  Location: [place] [facility];  Service: Neurosurgery;  Laterality: Left;  Left sural nerve biopsy  . TONSILLECTOMY    . TONSILLECTOMY AND ADENOIDECTOMY    . UPPER GASTROINTESTINAL ENDOSCOPY  JULY 2011 NAUSEA-D125,V6, PH 25   Bx; GASTRITIS, POUCH-5 CM LONG  . WISDOM TOOTH EXTRACTION       OB History    Gravida  3   Para  2   Term  1   Preterm  1   AB  1   Living  2     SAB  1   TAB  Ectopic      Multiple      Live Births  2            Home Medications    Prior to Admission medications   Medication Sig Start Date End Date Taking? Authorizing Provider  albuterol (VENTOLIN HFA) 108 (90 Base) MCG/ACT inhaler Inhale 2 puffs into the lungs every 6 (six) hours as needed for wheezing or shortness of breath. 08/31/19   Orson Eva, MD  amphetamine-dextroamphetamine (ADDERALL XR) 30 MG 24 hr capsule TAKE (1) CAPSULE BY MOUTH EVERY DAY. Patient taking differently: Take 30 mg by mouth See admin instructions. TAKE (1) CAPSULE BY MOUTH 4 TIMES WEEKLY 07/25/19   Cloria Spring, MD  DULoxetine (CYMBALTA) 60 MG capsule Take 1 capsule (60 mg total) by mouth 2 (two) times daily. 07/25/19   Cloria Spring, MD  ondansetron (ZOFRAN ODT) 8 MG disintegrating tablet Take 1 tablet (8 mg total) by mouth every 8 (eight) hours as needed for nausea or vomiting. 08/21/19   Kathyrn Drown, MD  ondansetron (ZOFRAN-ODT) 4 MG disintegrating tablet Take 1 tablet (4 mg total) by mouth every 8 (eight) hours as needed for nausea or vomiting. 08/31/19   Tat, Shanon Brow, MD  oxyCODONE (OXY IR/ROXICODONE) 5 MG immediate release tablet Take 1 tablet (5 mg total) by mouth  every 6 (six) hours as needed for moderate pain. 09/05/19   Kathyrn Drown, MD  pantoprazole (PROTONIX) 40 MG tablet Take 1 tablet (40 mg total) by mouth 2 (two) times daily before a meal. 08/31/19   Tat, Shanon Brow, MD  phenazopyridine (PYRIDIUM) 100 MG tablet Take 100 mg by mouth 3 (three) times daily as needed for pain.     [provider]  PROAIR HFA 108 (90 Base) MCG/ACT inhaler INHALE 2 PUFFS INTO THE LUNGS EVERY 6 HOURS AS NEEDED. Patient taking differently: Inhale 2 puffs into the lungs every 6 (six) hours as needed for wheezing or shortness of breath.  06/14/17   Kathyrn Drown, MD  promethazine (PHENERGAN) 25 MG tablet TAKE ONE TABLET BY MOUTH EVERY 6 HOURS AS NEEDED. Patient taking differently: Take 25 mg by mouth every 6 (six) hours as needed for nausea or vomiting.  07/03/18   Estill Dooms, NP  tiZANidine (ZANAFLEX) 4 MG tablet Take 4 mg by mouth 4 (four) times daily as needed for muscle spasms.  06/07/19   [provider]  topiramate (TOPAMAX) 50 MG tablet TAKE ONE IN THE AM AND 2 AT NIGHT Patient taking differently: Take 50-100 mg by mouth See admin instructions. Take 1 tablet ('50mg'$  total) in the am and 2 tablets ('100mg'$  total) at night 08/06/19   Cloria Spring, MD  valACYclovir (VALTREX) 1000 MG tablet Take 1 bid for 10 days then Take  1 daily for suppression Patient taking differently: Take 500 mg by mouth See admin instructions. Take 1 twice daily  for 10 days then Take  1 daily for suppression 05/03/19   Derrek Monaco A, NP  zolpidem (AMBIEN) 10 MG tablet Take 1 tablet (10 mg total) by mouth at bedtime. 07/25/19 08/27/19  Cloria Spring, MD    Family History Family History  Problem Relation Age of Onset  . Hemochromatosis Maternal Grandmother   . Migraines Maternal Grandmother   . Cancer Maternal Grandmother   . Breast cancer Maternal Grandmother   . Hypertension Father   . Diabetes Father   . Coronary artery disease Father   . Migraines Paternal  Grandmother   .  Breast cancer Paternal Grandmother   . Cancer Mother        breast  . Hemochromatosis Mother   . Breast cancer Mother   . Depression Mother   . Anxiety disorder Mother   . Coronary artery disease Paternal Grandfather   . Anxiety disorder Brother   . Bipolar disorder Brother   . Healthy Daughter   . Healthy Son     Social History Social History   Tobacco Use  . Smoking status: Former Smoker    Packs/day: 0.25    Years: 6.00    Pack years: 1.50    Types: E-cigarettes    Start date: 05/2018  . Smokeless tobacco: Never Used  Substance Use Topics  . Alcohol use: Yes    Alcohol/week: 0.0 standard drinks    Comment: 1-2 beers a couple times a month   . Drug use: Not Currently    Types: Marijuana    Comment: Previously opioid addiction went through rehab     Allergies   Gabapentin, Metoclopramide hcl, Tramadol, Ativan [lorazepam], Latex, Lyrica [pregabalin], Trazodone and nefazodone, Zofran [ondansetron], Sulfa antibiotics, Butrans [buprenorphine], Nucynta [tapentadol], Propofol, Tape, and Toradol [ketorolac tromethamine]   Review of Systems Review of Systems  Constitutional: Negative for chills and fever.  HENT: Negative for congestion, ear pain and sore throat.   Respiratory: Positive for shortness of breath. Negative for cough.        Negative for hemoptysis  Cardiovascular: Positive for chest pain.  Gastrointestinal: Positive for abdominal pain, nausea and vomiting. Negative for anal bleeding, blood in stool and constipation.  Genitourinary: Negative for dysuria, vaginal bleeding and vaginal discharge.  Neurological: Negative for syncope.  All other systems reviewed and are negative.   Physical Exam Updated Vital Signs BP 116/82 (BP Location: Right Arm)   Pulse (!) 112   Temp 98.4 F (36.9 C) (Oral)   Resp (!) 22   Ht '5\' 6"'$  (1.676 m)   Wt 88 kg   SpO2 95%   BMI 31.31 kg/m   Physical Exam Vitals signs and nursing note reviewed.   Constitutional:      General: She is not in acute distress.    Appearance: She is well-developed. She is not toxic-appearing.  HENT:     Head: Normocephalic and atraumatic.  Eyes:     General:        Right eye: No discharge.        Left eye: No discharge.     Conjunctiva/sclera: Conjunctivae normal.  Neck:     Musculoskeletal: Neck supple.  Cardiovascular:     Rate and Rhythm: Regular rhythm. Tachycardia present.     Comments: 2+ symmetric radial pulses.   Pulmonary:     Effort: Tachypnea present. No respiratory distress.     Breath sounds: Normal breath sounds. No wheezing, rhonchi or rales.  Chest:     Chest wall: Tenderness (anterior chest wall) present.  Abdominal:     General: Bowel sounds are normal. There is no distension.     Palpations: Abdomen is soft.     Tenderness: There is abdominal tenderness in the right upper quadrant, epigastric area, periumbilical area and left upper quadrant. There is no guarding or rebound.  Musculoskeletal:     Right lower leg: She exhibits no tenderness. No edema.     Left lower leg: She exhibits no tenderness. No edema.  Skin:    General: Skin is warm and dry.     Findings: No rash.  Neurological:  Mental Status: She is alert.     Comments: Clear speech.   Psychiatric:        Behavior: Behavior normal.    ED Treatments / Results  Labs (all labs ordered are listed, but only abnormal results are displayed) Labs Reviewed  COMPREHENSIVE METABOLIC PANEL - Abnormal; Notable for the following components:      Result Value   Chloride 114 (*)    CO2 19 (*)    Calcium 8.8 (*)    All other components within normal limits  CBC WITH DIFFERENTIAL/PLATELET - Abnormal; Notable for the following components:   MCV 101.5 (*)    Eosinophils Absolute 1.2 (*)    All other components within normal limits  URINALYSIS, ROUTINE W REFLEX MICROSCOPIC - Abnormal; Notable for the following components:   APPearance HAZY (*)    All other components  within normal limits  LIPASE, BLOOD  D-DIMER, QUANTITATIVE (NOT AT [facility])  PREGNANCY, URINE  TROPONIN I (HIGH SENSITIVITY)    EKG EKG Interpretation  Date/Time:  Monday September 10 2019 17:22:35 EDT Ventricular Rate:  106 PR Interval:  170 QRS Duration: 76 QT Interval:  354 QTC Calculation: 470 R Axis:   82 Text Interpretation:  Sinus tachycardia Otherwise normal ECG Confirmed by Gerlene Fee [phone]) on 09/10/2019 8:35:59 PM   Radiology Dg Abd Acute W/chest  Result Date: 09/10/2019 CLINICAL DATA:  Shortness of breath EXAM: DG ABDOMEN ACUTE W/ 1V CHEST COMPARISON:  None. FINDINGS: There is no evidence of dilated bowel loops or free intraperitoneal air. No radiopaque calculi or other significant radiographic abnormality is seen. Heart size and mediastinal contours are within normal limits. Both lungs are clear. Is cement fixation seen at the T12 vertebral body. Surgical clips seen the right and left mid abdomen. IMPRESSION: Nonobstructive bowel gas pattern.  No acute cardiopulmonary disease. Electronically Signed   By: Prudencio Pair M.D.   On: 09/10/2019 19:46    Procedures Procedures (including critical care time)  Medications Ordered in ED Medications  HYDROmorphone (DILAUDID) injection 1 mg (1 mg Intravenous Given 09/10/19 1850)  sodium chloride 0.9 % bolus 1,000 mL (0 mLs Intravenous Stopped 09/10/19 2105)  promethazine (PHENERGAN) injection 12.5 mg (12.5 mg Intravenous Given 09/10/19 2010)  oxyCODONE (Oxy IR/ROXICODONE) immediate release tablet 10 mg (10 mg Oral Given 09/10/19 2105)     Initial Impression / Assessment and Plan / ED Course  I have reviewed the triage vital signs and the nursing notes.  Pertinent labs & imaging results that were available during my care of the patient were reviewed by me and considered in my medical decision making (see chart for details).   Patient presents to the ED for return of abdominal discomfort w/ N/V & dyspnea.  Recent admission for  similar pain- had RUQ [name], CT A/P, MRCP, EGD- all reassuring- concern was for sphincter of oddi dysfunction w/ plan to follow up [facility] outpatient for ERCP. On arrival she is mildly tachycardic/tahypneic, vitals otherwise WNL. Exam w/ tenderness to the chest and upper abdomen. No peritoneal signs. Plan for labs, chest/abdominal xray & symptomatic management.   CBC: No leukocytosis or anemia CMP: Mild hypocalcemia, no significant electrolyte derangement.  LFTs and renal function are preserved Lipase: WNL. D-dimer: WNL Troponin: Less than 2 EKG: No STEMI, sinus tachycardia Chest/abdominal x-ray without acute concerning abnormalities, no obvious obstruction or perforation.  Overall reassuring work-up in the emergency department. Low risk heart pathway score, troponin less than 2 after greater than 6 hours of continuous pain, EKG  with no significant ischemic changes, doubt ACS.  Low risk [place], d-dimer WNL, doubt PE.  No widened mediastinum on imaging, symmetric pulses, doubt dissection.  Repeat abdominal exam remains without peritoneal signs.  Patient has had several recent imaging studies of the abdomen/pelvis during her last admission for similar symptoms.  Will discuss with gastroenterology on-call.  Discussed with gastroenterologist Dr. Oneida Alar given reassuring work-up with normal LFTs no further imaging recommended, will have patient follow-up outpatient.  Patient feeling a bit better, able to tolerate p.o., requesting discharge home. She is also requesting additional pain medication, database reviewed-would have run out of her prescribed oxycodone yesterday, will provide short course. I discussed results, treatment plan, need for follow-up, and return precautions with the patient. Provided opportunity for questions, patient confirmed understanding and is in agreement with plan.   Findings and plan of care discussed with supervising physician Dr. Sedonia Small who has evaluated patient & is in agreement.    Final Clinical Impressions(s) / ED Diagnoses   Final diagnoses:  Abdominal pain, unspecified abdominal location    ED Discharge Orders         Ordered    oxyCODONE (OXY IR/ROXICODONE) 5 MG immediate release tablet  Every 4 hours PRN     09/10/19 2141           Amaryllis Dyke, PA-C 09/10/19 2152    Maudie Flakes, MD 09/11/19 2245

## 2019-09-10 NOTE — ED Notes (Signed)
Pt given water to drink. 

## 2019-09-11 ENCOUNTER — Other Ambulatory Visit (HOSPITAL_COMMUNITY): Payer: Self-pay | Admitting: Psychiatry

## 2019-09-11 ENCOUNTER — Ambulatory Visit (INDEPENDENT_AMBULATORY_CARE_PROVIDER_SITE_OTHER): Payer: Medicaid Other | Admitting: Family Medicine

## 2019-09-11 ENCOUNTER — Encounter: Payer: Self-pay | Admitting: Family Medicine

## 2019-09-11 VITALS — BP 102/70 | HR 107 | Temp 98.8°F

## 2019-09-11 DIAGNOSIS — R062 Wheezing: Secondary | ICD-10-CM | POA: Diagnosis not present

## 2019-09-11 DIAGNOSIS — R1011 Right upper quadrant pain: Secondary | ICD-10-CM

## 2019-09-11 DIAGNOSIS — J45909 Unspecified asthma, uncomplicated: Secondary | ICD-10-CM | POA: Diagnosis not present

## 2019-09-11 DIAGNOSIS — I951 Orthostatic hypotension: Secondary | ICD-10-CM | POA: Diagnosis not present

## 2019-09-11 MED ORDER — ALBUTEROL SULFATE (2.5 MG/3ML) 0.083% IN NEBU: 2.5000 mg | INHALATION_SOLUTION | RESPIRATORY_TRACT | 12 refills | Status: DC | PRN

## 2019-09-11 MED ORDER — PREDNISONE 20 MG PO TABS: ORAL_TABLET | ORAL | 0 refills | Status: DC

## 2019-09-11 NOTE — Progress Notes (Signed)
   Subjective:    Patient ID: Penny [name], female    DOB: [birth year], 33 y.o.   MRN: [id number] Very nice patient HPIRecheck on wheezing. Pt is still having cough. Has not ate since Saturday.  Patient was in the ER over the weekend Patient was also in the hospital for several days I reviewed over the ER notes as well as the results with the patient as well as her mother who is on video phone questions were answered 25 minutes spent with family greater than half in discussion The patient states she has constant nausea has a hard time keeping anything down she states intermittent vomiting denies diarrhea denies fever chills is having a little bit of wheezing but denies any chest congestion she did have a chest x-ray d-dimer and other test while in the hospital these did not show pneumonia or blood clot gastroenterology during her hospitalization consulted with [facility] but so far she has not heard anything from them  [facility] extensive health issues including multiple GI symptoms neurologic symptoms psychiatric symptoms Review of Systems  Constitutional: Positive for fatigue. Negative for activity change, appetite change and fever.  HENT: Negative for congestion and rhinorrhea.   Respiratory: Positive for wheezing. Negative for cough and shortness of breath.   Cardiovascular: Negative for chest pain and leg swelling.  Gastrointestinal: Positive for abdominal pain and nausea. Negative for vomiting.  Skin: Negative for color change.  Neurological: Positive for dizziness. Negative for weakness and headaches.  Psychiatric/Behavioral: Negative for agitation and confusion.       Objective:   Physical Exam  Lungs clear except for the bases where there are some wheezes no respiratory distress heart is regular pulses normal extremities no edema skin warm dry abdomen mild tenderness no guarding or rebound blood pressure taken sitting was 102/70 standing was 84 over palpable      Assessment & Plan:   Wheezing-albuterol neb treatments on a regular basis Short course prednisone No need to do an x-ray this was just done yesterday I do not recommend additional lab work at this time  Abdominal pain I did touch base with Dr.Rehman they did speak with the gastroenterologist in [place].  They are supposed to be calling the patient it is recommended that if the patient gets worse the next step would be to go to the ER at [facility]  As for her pain we will not be prescribing oxycodone.  I truly do not want to get into ongoing issue of using oxycodone for her pain because she has had some previous medication related issues  She does have mild orthostatic hypotension I told her and the mother who is watching by video phone that if she does not improve over the course the next 24 to 48 hours they will need to go back to the ER to get some IV fluids

## 2019-09-12 ENCOUNTER — Ambulatory Visit: Payer: Medicaid Other | Admitting: Family Medicine

## 2019-09-12 ENCOUNTER — Telehealth: Payer: Self-pay | Admitting: Family Medicine

## 2019-09-12 ENCOUNTER — Other Ambulatory Visit (HOSPITAL_COMMUNITY): Payer: Self-pay | Admitting: Psychiatry

## 2019-09-12 MED ORDER — CLONAZEPAM 0.5 MG PO TABS: 0.5000 mg | ORAL_TABLET | Freq: Three times a day (TID) | ORAL | 1 refills | Status: DC | PRN

## 2019-09-12 NOTE — Telephone Encounter (Signed)
I did talk with Dr.Rehman regarding this patient He states that he did communicate with the doctor at [facility] and it is essentially up to [facility] to get an appointment but this could take some time Dr.Rehman stated that if the patient situation becomes unbearable he would recommend that she go to [place] [facility] for further evaluation and if necessary they could admit her if they did not feel she needed admitting at least they could help speed up the process to get her in with gastroenterology at [facility]

## 2019-09-12 NOTE — Telephone Encounter (Addendum)
Patient advised Dr Nicki Reaper did talk with Dr.Rehman regarding this patient He states that he did communicate with the doctor at [facility] and it is essentially up to [facility] to get an appointment but this could take some time Dr.Rehman stated that if the patient situation becomes unbearable he would recommend that she go to [facility] ER for further evaluation and if necessary they could admit her if they did not feel she needed admitting at least they could help speed up the process to get her in with gastroenterology at [facility]. Patient verbalized understanding.

## 2019-09-12 NOTE — Telephone Encounter (Signed)
Spoke with the patient and relayed message regarding the Xanax, that doctor will not be prescribing that medication due to addiction potential. Patient was compliant. While on the phone with patient, she requested a refill on her Clonazepam.

## 2019-09-12 NOTE — Telephone Encounter (Signed)
sent 

## 2019-09-12 NOTE — Telephone Encounter (Signed)
Patient states breathing is better but still unable to eat food but drinking protein shakes and pedi light She staes having really bad abdominal pains and to know what can she do for them. Please advise

## 2019-09-13 ENCOUNTER — Telehealth: Payer: Self-pay | Admitting: Family Medicine

## 2019-09-13 ENCOUNTER — Telehealth (INDEPENDENT_AMBULATORY_CARE_PROVIDER_SITE_OTHER): Payer: Self-pay | Admitting: Internal Medicine

## 2019-09-13 NOTE — Telephone Encounter (Signed)
Patient left voice mail message wanting to get information on her referral to [facility] - please advise

## 2019-09-13 NOTE — Telephone Encounter (Signed)
Pt calling to see if we can change her nebulizer meds - states that the albuterol causes increased heart rate & causes her body to shake  States that [facility] gave her a different medicine & she didn't have these side effects   Please advise & call pt     [employer]

## 2019-09-13 NOTE — Telephone Encounter (Signed)
Patient advised It is not unusual for albuterol to cause this side effect  There is a strong probability that Medicaid will not approve Xopenex because of its high cost It is a nonpreferred medication  Patient verbalized understanding.

## 2019-09-13 NOTE — Telephone Encounter (Signed)
Notified patient.

## 2019-09-13 NOTE — Telephone Encounter (Signed)
Scott to see, plz research for him what med was used in the er so he will know when he  See's the message

## 2019-09-13 NOTE — Telephone Encounter (Signed)
It is not unusual for albuterol to cause this side effect  There is a strong probability that Medicaid will not approve Xopenex because of its high cost It is a nonpreferred medication

## 2019-09-13 NOTE — Telephone Encounter (Signed)
I called [facility] and spoke to schedule for Dr Lysle Rubens, they did receive referral and records and it is in review. Once it is reviewed they will reach out to the patient to schedule. I did call Shaqunda and made her aware of this

## 2019-09-18 ENCOUNTER — Other Ambulatory Visit: Payer: Self-pay

## 2019-09-18 ENCOUNTER — Emergency Department (HOSPITAL_COMMUNITY): Payer: Medicaid Other

## 2019-09-18 ENCOUNTER — Inpatient Hospital Stay (HOSPITAL_COMMUNITY)
Admission: EM | Admit: 2019-09-18 | Discharge: 2019-09-20 | DRG: 445 | Disposition: A | Discharge status: Home / Self Care | Payer: Medicaid Other | Attending: Internal Medicine | Admitting: Internal Medicine

## 2019-09-18 ENCOUNTER — Encounter (HOSPITAL_COMMUNITY): Payer: Self-pay | Admitting: Emergency Medicine

## 2019-09-18 ENCOUNTER — Telehealth (INDEPENDENT_AMBULATORY_CARE_PROVIDER_SITE_OTHER): Payer: Self-pay | Admitting: Internal Medicine

## 2019-09-18 DIAGNOSIS — Z818 Family history of other mental and behavioral disorders: Secondary | ICD-10-CM

## 2019-09-18 DIAGNOSIS — G43709 Chronic migraine without aura, not intractable, without status migrainosus: Secondary | ICD-10-CM | POA: Diagnosis present

## 2019-09-18 DIAGNOSIS — G629 Polyneuropathy, unspecified: Secondary | ICD-10-CM | POA: Diagnosis present

## 2019-09-18 DIAGNOSIS — Z87891 Personal history of nicotine dependence: Secondary | ICD-10-CM

## 2019-09-18 DIAGNOSIS — R0902 Hypoxemia: Secondary | ICD-10-CM | POA: Diagnosis not present

## 2019-09-18 DIAGNOSIS — F418 Other specified anxiety disorders: Secondary | ICD-10-CM | POA: Diagnosis present

## 2019-09-18 DIAGNOSIS — G894 Chronic pain syndrome: Secondary | ICD-10-CM | POA: Diagnosis present

## 2019-09-18 DIAGNOSIS — G8929 Other chronic pain: Secondary | ICD-10-CM | POA: Diagnosis present

## 2019-09-18 DIAGNOSIS — R7401 Elevation of levels of liver transaminase levels: Secondary | ICD-10-CM | POA: Diagnosis present

## 2019-09-18 DIAGNOSIS — Z882 Allergy status to sulfonamides status: Secondary | ICD-10-CM

## 2019-09-18 DIAGNOSIS — E669 Obesity, unspecified: Secondary | ICD-10-CM | POA: Diagnosis present

## 2019-09-18 DIAGNOSIS — R109 Unspecified abdominal pain: Secondary | ICD-10-CM | POA: Diagnosis not present

## 2019-09-18 DIAGNOSIS — R112 Nausea with vomiting, unspecified: Secondary | ICD-10-CM | POA: Diagnosis not present

## 2019-09-18 DIAGNOSIS — Z820 Family history of epilepsy and other diseases of the nervous system: Secondary | ICD-10-CM

## 2019-09-18 DIAGNOSIS — E860 Dehydration: Secondary | ICD-10-CM

## 2019-09-18 DIAGNOSIS — K219 Gastro-esophageal reflux disease without esophagitis: Secondary | ICD-10-CM | POA: Diagnosis present

## 2019-09-18 DIAGNOSIS — Z9851 Tubal ligation status: Secondary | ICD-10-CM

## 2019-09-18 DIAGNOSIS — M329 Systemic lupus erythematosus, unspecified: Secondary | ICD-10-CM | POA: Diagnosis present

## 2019-09-18 DIAGNOSIS — F988 Other specified behavioral and emotional disorders with onset usually occurring in childhood and adolescence: Secondary | ICD-10-CM | POA: Diagnosis present

## 2019-09-18 DIAGNOSIS — G2581 Restless legs syndrome: Secondary | ICD-10-CM | POA: Diagnosis present

## 2019-09-18 DIAGNOSIS — I10 Essential (primary) hypertension: Secondary | ICD-10-CM | POA: Diagnosis not present

## 2019-09-18 DIAGNOSIS — Z9884 Bariatric surgery status: Secondary | ICD-10-CM

## 2019-09-18 DIAGNOSIS — R748 Abnormal levels of other serum enzymes: Secondary | ICD-10-CM | POA: Diagnosis present

## 2019-09-18 DIAGNOSIS — K760 Fatty (change of) liver, not elsewhere classified: Secondary | ICD-10-CM | POA: Diagnosis present

## 2019-09-18 DIAGNOSIS — Z885 Allergy status to narcotic agent status: Secondary | ICD-10-CM

## 2019-09-18 DIAGNOSIS — Z6832 Body mass index (BMI) 32.0-32.9, adult: Secondary | ICD-10-CM

## 2019-09-18 DIAGNOSIS — M797 Fibromyalgia: Secondary | ICD-10-CM | POA: Diagnosis present

## 2019-09-18 DIAGNOSIS — Z20828 Contact with and (suspected) exposure to other viral communicable diseases: Secondary | ICD-10-CM | POA: Diagnosis present

## 2019-09-18 DIAGNOSIS — K289 Gastrojejunal ulcer, unspecified as acute or chronic, without hemorrhage or perforation: Secondary | ICD-10-CM | POA: Diagnosis present

## 2019-09-18 DIAGNOSIS — Q211 Atrial septal defect: Secondary | ICD-10-CM

## 2019-09-18 DIAGNOSIS — R1084 Generalized abdominal pain: Secondary | ICD-10-CM | POA: Diagnosis not present

## 2019-09-18 DIAGNOSIS — K838 Other specified diseases of biliary tract: Principal | ICD-10-CM | POA: Diagnosis present

## 2019-09-18 DIAGNOSIS — Z91040 Latex allergy status: Secondary | ICD-10-CM

## 2019-09-18 DIAGNOSIS — Z9089 Acquired absence of other organs: Secondary | ICD-10-CM

## 2019-09-18 DIAGNOSIS — Z888 Allergy status to other drugs, medicaments and biological substances status: Secondary | ICD-10-CM

## 2019-09-18 DIAGNOSIS — Z79899 Other long term (current) drug therapy: Secondary | ICD-10-CM

## 2019-09-18 DIAGNOSIS — F410 Panic disorder [episodic paroxysmal anxiety] without agoraphobia: Secondary | ICD-10-CM | POA: Diagnosis present

## 2019-09-18 DIAGNOSIS — Z9049 Acquired absence of other specified parts of digestive tract: Secondary | ICD-10-CM

## 2019-09-18 DIAGNOSIS — R52 Pain, unspecified: Secondary | ICD-10-CM | POA: Diagnosis not present

## 2019-09-18 DIAGNOSIS — R0689 Other abnormalities of breathing: Secondary | ICD-10-CM | POA: Diagnosis not present

## 2019-09-18 DIAGNOSIS — Z91048 Other nonmedicinal substance allergy status: Secondary | ICD-10-CM

## 2019-09-18 DIAGNOSIS — N3010 Interstitial cystitis (chronic) without hematuria: Secondary | ICD-10-CM | POA: Diagnosis present

## 2019-09-18 LAB — CBC
HCT: 40.6 % (ref 36.0–46.0)
Hemoglobin: 13.5 g/dL (ref 12.0–15.0)
MCH: 32.6 pg (ref 26.0–34.0)
MCHC: 33.3 g/dL (ref 30.0–36.0)
MCV: 98.1 fL (ref 80.0–100.0)
Platelets: 323 10*3/uL (ref 150–400)
RBC: 4.14 MIL/uL (ref 3.87–5.11)
RDW: 12.3 % (ref 11.5–15.5)
WBC: 7 10*3/uL (ref 4.0–10.5)
nRBC: 0 % (ref 0.0–0.2)

## 2019-09-18 LAB — URINALYSIS, ROUTINE W REFLEX MICROSCOPIC
Bilirubin Urine: NEGATIVE
Glucose, UA: NEGATIVE mg/dL
Hgb urine dipstick: NEGATIVE
Ketones, ur: NEGATIVE mg/dL
Leukocytes,Ua: NEGATIVE
Nitrite: NEGATIVE
Protein, ur: NEGATIVE mg/dL
Specific Gravity, Urine: 1.005 (ref 1.005–1.030)
pH: 7 (ref 5.0–8.0)

## 2019-09-18 LAB — COMPREHENSIVE METABOLIC PANEL
ALT: 90 U/L — ABNORMAL HIGH (ref 0–44)
AST: 63 U/L — ABNORMAL HIGH (ref 15–41)
Albumin: 4.5 g/dL (ref 3.5–5.0)
Alkaline Phosphatase: 137 U/L — ABNORMAL HIGH (ref 38–126)
Anion gap: 10 (ref 5–15)
BUN: 9 mg/dL (ref 6–20)
CO2: 22 mmol/L (ref 22–32)
Calcium: 9.5 mg/dL (ref 8.9–10.3)
Chloride: 104 mmol/L (ref 98–111)
Creatinine, Ser: 0.7 mg/dL (ref 0.44–1.00)
GFR calc Af Amer: >60 mL/min (ref >60)
GFR calc non Af Amer: >60 mL/min (ref >60)
Glucose, Bld: 85 mg/dL (ref 70–99)
Potassium: 3.9 mmol/L (ref 3.5–5.1)
Sodium: 136 mmol/L (ref 135–145)
Total Bilirubin: 0.5 mg/dL (ref 0.3–1.2)
Total Protein: 8 g/dL (ref 6.5–8.1)

## 2019-09-18 LAB — POC URINE PREG, ED: Preg Test, Ur: NEGATIVE

## 2019-09-18 LAB — LIPASE, BLOOD: Lipase: 27 U/L (ref 11–51)

## 2019-09-18 MED ORDER — ONDANSETRON 4 MG PO TBDP
4.0000 mg | ORAL_TABLET | Freq: Once | ORAL | Status: AC | PRN
  Administered 2019-09-18: 4 mg via ORAL
  Filled 2019-09-18: qty 1

## 2019-09-18 MED ORDER — FENTANYL CITRATE (PF) 100 MCG/2ML IJ SOLN
50.0000 ug | Freq: Once | INTRAMUSCULAR | Status: AC
  Administered 2019-09-18: 50 ug via INTRAVENOUS
  Filled 2019-09-18: qty 2

## 2019-09-18 MED ORDER — SODIUM CHLORIDE 0.9% FLUSH: 3.0000 mL | Freq: Once | INTRAVENOUS | Status: DC

## 2019-09-18 MED ORDER — SODIUM CHLORIDE 0.9 % IV BOLUS
1000.0000 mL | Freq: Once | INTRAVENOUS | Status: AC
  Administered 2019-09-18: 1000 mL via INTRAVENOUS

## 2019-09-18 NOTE — ED Provider Notes (Signed)
[facility] Provider Note   CSN: [id number] Arrival date & time: 09/18/19  1633     History   Chief Complaint Chief Complaint  Patient presents with   Abdominal Pain    HPI Penny [name] is a 33 y.o. female.     Patient with history of seizures, pseudoseizures, anemia, depression, primary nausea, substance abuse, previous gastric bypass, cholecystectomy chronic pain syndrome presenting at the request of her GI team with worsening upper abdominal pain associated with nausea and vomiting.  She describes constant pain in the center of her abdomen that radiates to her back and into chest that has been constant since at least last September.  It is worse with deep breathing and movement.  Today she developed nausea vomiting and unable to tolerate p.o.  Her last BM was today and loose.  She denies any fevers, chills  Patient has had previous evaluations outlined below. Recent hospital admission 09/21-09/25 2020 for intractable abdominal pain & nausea-- CT A/P 09/19 negative for acute findings, RUQ [name] 09/21 negative for acute findings, MRCP 9/22 negative for choledocholithiasis or acute hepatic abnormality, EGD 08/31/19 with small anastomotic ulcer & patent gastrojujonostomy---> Gastroenterologist Dr. 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 Ultimately patient states sxs returned, seem worse, more vomiting, called Dr. Olevia Perches office & was referred to the ED.    The history is provided by the patient.  Abdominal Pain Associated symptoms: chills, diarrhea, nausea and vomiting   Associated symptoms: no chest pain, no cough, no dysuria, no fever, no hematuria and no shortness of breath     Past Medical History:  Diagnosis Date   ADD (attention deficit disorder)    Anemia 2011   2o to GASTRIC BYPASS   Anginal pain ([place])    Anxiety      Blood transfusion without reported diagnosis    Chronic daily headache    Depression    Depression    Dysmenorrhea 09/18/2013   Dysrhythmia    Elevated liver enzymes JUL 2011ALK PHOS 111-127 AST  143-267 ALT  213-321T BILI 0.6  ALB  3.7-4.06 Jun 2011 ALK PHOS 118 AST 24 ALT 42 T BILI 0.4 ALB 3.9   Encounter for drug rehabilitation    at behavioral health for opioid addiction about 4 years ago   Family history of adverse reaction to anesthesia    'dad had to be kept on pump for breathing for morphine'   Fatty liver    Fibroid 01/18/2017   Fibromyalgia    Gastric bypass status for obesity    Gastritis JULY 2011   Heart murmur    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   History of Holter monitoring    Hx of opioid abuse ([place])    for about 4 years, about 4 years ago   Interstitial cystitis    Iron deficiency anemia 07/23/2010   Irritable bowel syndrome 2012 DIARRHEA   JUN 2012 TTG IgA 14.9   IUD FEB 2010   Lupus ([place])    Menorrhagia 07/18/2013   Migraines    Obesity (BMI 30-39.9) 2011 228 LBS BMI 36.8   Ovarian cyst    Patient desires pregnancy 09/18/2013   Polyneuropathy    PONV (postoperative nausea and vomiting) seizure post-operatively   seizure following ankle surgery   Potassium (K) deficiency    Pregnant 12/25/2013   Psychiatric  pseudoseizure    RLQ abdominal pain 07/18/2013   Sciatica of left side 11/14/2014   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   Stress 09/18/2013    Patient Active Problem List   Diagnosis Date Noted   Gastroesophageal reflux disease without esophagitis    Cannabis abuse 08/30/2019   Dyspnea    Severe anxiety 08/21/2019   Hematemesis with nausea 04/23/2019   Absolute anemia 04/23/2019   Urge incontinence 10/26/2018   S/P kyphoplasty 08/02/2018   Herpes genitalis in women 12/19/2017   Abnormal urine odor 03/30/2017   Urinary tract infection with hematuria 03/30/2017    Screening for genitourinary condition 03/30/2017   Pain 03/30/2017   Burning with urination 03/30/2017   Family history of breast cancer in mother,uncertain BR CA status 02/28/2017   Fibroid 01/18/2017   Chronic pain syndrome 12/05/2016   Folate deficiency 12/04/2016   Nausea vomiting and diarrhea 12/04/2016   Severe anemia 12/04/2016   Symptomatic anemia 12/03/2016   Complaints of leg weakness    Paresthesia    Left-sided weakness    Uncontrolled pain 01/24/2016   Malnutrition of moderate degree 01/24/2016   Neuropathy 01/23/2016   Inability to walk 01/23/2016   Paresthesias    Bilateral leg numbness 11/26/2015   Major depressive disorder, recurrent episode, 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[place]) 05/10/2015   Post partum depression 05/09/2015   Anastomotic ulcer    Dysphagia    Hematemesis 04/07/2015   Intractable vomiting with nausea 04/07/2015   Elevated liver enzymes    Epigastric pain    Pseudoseizure ([place]) 02/22/2015   Seizure-like activity ([place]) 02/22/2015   Fibromyalgia 02/18/2015   Vulvar fissure 02/18/2015   Clinical depression 02/01/2015   Current smoker 01/29/2015   Current tobacco use 01/29/2015   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   Leg weakness, bilateral 01/02/2015   Gait disorder 01/02/2015   Other symptoms and signs involving the musculoskeletal system 01/02/2015   Abnormal gait 01/02/2015   Cervicalgia 12/18/2014   Sciatica of left side 11/14/2014   Neuralgia neuritis, sciatic nerve 11/14/2014   Pseudoseizures 09/12/2014   Encounter for sterilization 09/09/2014   Abdominal pain, acute, right lower quadrant 08/22/2014   Headache, migraine 08/19/2014   Seizure ([place]) 08/18/2014   Encephalopathy 08/13/2014   Headache 08/13/2014   Altered mental status 08/13/2014   Right leg numbness 07/31/2014   Disturbance of skin sensation 07/31/2014   Hypertension in pregnancy, transient  07/08/2014   Previous gastric bypass affecting pregnancy, antepartum 05/22/2014   Patent foramen ovale with right to left shunt 05/17/2014   ASD (atrial septal defect), ostium secundum 05/17/2014   Depression complicating pregnancy in second trimester, antepartum 05/09/2014   Antepartum mental disorder in pregnancy 05/09/2014   Rapid palpitations 02/18/2014   H/O maternal third degree perineal laceration, currently pregnant 02/18/2014   High-risk pregnancy 02/18/2014   Supervision of pregnancy with other poor reproductive or obstetric history, unspecified trimester 02/18/2014   Restless legs 01/16/2014   Restless leg 01/16/2014   Chronic interstitial cystitis 10/23/2013   Menorrhagia 07/18/2013   Excessive and frequent menstruation 07/18/2013   h/o Opiate addiction 03/11/2012    Class: Acute   History of migraine headaches 03/10/2012    Class: Acute   Depression with anxiety 03/10/2012    Class: Chronic   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 03/10/2012    Class: Chronic   ADD (attention deficit disorder) without hyperactivity 03/10/2012    Class: Chronic   Panic disorder without agoraphobia 03/10/2012  H/O disease 03/10/2012   Dysthymia 03/10/2012   Pelvic congestion syndrome 10/13/2011   Coitalgia 10/13/2011   Chronic migraine without aura 10/13/2011   Unspecified dyspareunia 10/13/2011   IBS (irritable bowel syndrome) 08/25/2011   Diarrhea 05/27/2011   OBESITY, UNSPECIFIED 09/17/2010   Transaminitis 09/17/2010   IDA (iron deficiency anemia) 07/23/2010    Past Surgical History:  Procedure Laterality Date   BIOPSY  04/10/2015   Procedure: BIOPSY;  Surgeon: Daneil Dolin, MD;  Location: [facility];  Service: Endoscopy;;   cath self every nite     for sodium bicarb injection (discontinued 2013)   CHOLECYSTECTOMY  2005   biliary dyskinesia   COLONOSCOPY  JUN 2012 ABD PN/DIARRHEA WITH PROPOFOL   NL COLON   DILATION  AND CURETTAGE OF UTERUS     DILITATION & CURRETTAGE/HYSTROSCOPY WITH NOVASURE ABLATION N/A 03/24/2017   Procedure: DILATATION & CURETTAGE/HYSTEROSCOPY WITH NOVASURE ENDOMETRIAL ABLATION;  Surgeon: Jonnie Kind, MD;  Location: [facility];  Service: Gynecology;  Laterality: N/A;   ESOPHAGEAL DILATION N/A 04/10/2015   Procedure: ESOPHAGEAL DILATION WITH 54FR MALONEY DILATOR;  Surgeon: Daneil Dolin, MD;  Location: [facility];  Service: Endoscopy;  Laterality: N/A;   ESOPHAGOGASTRODUODENOSCOPY     ESOPHAGOGASTRODUODENOSCOPY (EGD) WITH PROPOFOL N/A 04/10/2015   Procedure: ESOPHAGOGASTRODUODENOSCOPY (EGD) WITH PROPOFOL;  Surgeon: Daneil Dolin, MD;  Location: [facility];  Service: Endoscopy;  Laterality: N/A;   ESOPHAGOGASTRODUODENOSCOPY (EGD) WITH PROPOFOL N/A 12/06/2016   Procedure: ESOPHAGOGASTRODUODENOSCOPY (EGD) WITH PROPOFOL;  Surgeon: Danie Binder, MD;  Location: [facility];  Service: Endoscopy;  Laterality: N/A;   ESOPHAGOGASTRODUODENOSCOPY (EGD) WITH PROPOFOL N/A 04/27/2019   Procedure: ESOPHAGOGASTRODUODENOSCOPY (EGD) WITH PROPOFOL;  Surgeon: Rogene Houston, MD;  [facility]: [facility];  Service: Endoscopy;  Laterality: N/A;  730   ESOPHAGOGASTRODUODENOSCOPY (EGD) WITH PROPOFOL N/A 08/31/2019   Procedure: ESOPHAGOGASTRODUODENOSCOPY (EGD) WITH PROPOFOL;  Surgeon: Rogene Houston, MD;  Location: [facility];  Service: Endoscopy;  Laterality: N/A;   GAB  2007   in [place] 5 CM   GASTRIC BYPASS  06/2006   HYSTEROSCOPY W/D&C N/A 09/12/2014   Procedure: DILATATION AND CURETTAGE /HYSTEROSCOPY;  Surgeon: Jonnie Kind, MD;  Location: [facility];  Service: Gynecology;  Laterality: N/A;   KYPHOPLASTY N/A 08/02/2018   Procedure: KYPHOPLASTY T12;  Surgeon: Melina Schools, MD;  Location: [facility];  Service: Orthopedics;  Laterality: N/A;  60 mins   LAPAROSCOPIC TUBAL LIGATION Bilateral 03/24/2017   Procedure: LAPAROSCOPIC TUBAL LIGATION ([place] Rings);  Surgeon: Jonnie Kind, MD;  Location:  [facility];  Service: Gynecology;  Laterality: Bilateral;   REPAIR VAGINAL CUFF N/A 07/30/2014   Procedure: REPAIR VAGINAL CUFF;  Surgeon: Mora Bellman, MD;  Location: [facility];  Service: Gynecology;  Laterality: N/A;   SAVORY DILATION  06/20/2012   Dr. Barnie Alderman gastritis/Ulcer in the mid jejunum. Empiric dilation.    SURAL NERVE BX Left 02/25/2016   Procedure: LEFT SURAL NERVE BIOPSY;  Surgeon: Jovita Gamma, MD;  Location: [facility];  Service: Neurosurgery;  Laterality: Left;  Left sural nerve biopsy   TONSILLECTOMY     TONSILLECTOMY AND ADENOIDECTOMY     UPPER GASTROINTESTINAL ENDOSCOPY  JULY 2011 NAUSEA-D125,V6, PH 25   Bx; GASTRITIS, POUCH-5 CM LONG   WISDOM TOOTH EXTRACTION       OB History    Gravida  3   Para  2   Term  1   Preterm  1   AB  1   Living  2  SAB  1   TAB      Ectopic      Multiple      Live Births  2            Home Medications    Prior to Admission medications   Medication Sig Start Date End Date Taking? Authorizing Provider  albuterol (PROVENTIL) (2.5 MG/3ML) 0.083% nebulizer solution Take 3 mLs (2.5 mg total) by nebulization every 4 (four) hours as needed for wheezing. 09/11/19   Kathyrn Drown, MD  albuterol (VENTOLIN HFA) 108 (90 Base) MCG/ACT inhaler Inhale 2 puffs into the lungs every 6 (six) hours as needed for wheezing or shortness of breath. 08/31/19   Orson Eva, MD  amphetamine-dextroamphetamine (ADDERALL XR) 30 MG 24 hr capsule TAKE (1) CAPSULE BY MOUTH EVERY DAY. Patient taking differently: Take 30 mg by mouth See admin instructions. TAKE (1) CAPSULE BY MOUTH 4 TIMES WEEKLY 07/25/19   Cloria Spring, MD  clonazePAM (KLONOPIN) 0.5 MG tablet Take 1 tablet (0.5 mg total) by mouth 3 (three) times daily as needed for anxiety. 09/12/19 09/11/20  Cloria Spring, MD  DULoxetine (CYMBALTA) 60 MG capsule Take 1 capsule (60 mg total) by mouth 2 (two) times daily. 07/25/19   Cloria Spring, MD  ondansetron (ZOFRAN ODT) 8 MG  disintegrating tablet Take 1 tablet (8 mg total) by mouth every 8 (eight) hours as needed for nausea or vomiting. 08/21/19   Kathyrn Drown, MD  ondansetron (ZOFRAN-ODT) 4 MG disintegrating tablet Take 1 tablet (4 mg total) by mouth every 8 (eight) hours as needed for nausea or vomiting. 08/31/19   Tat, Shanon Brow, MD  oxyCODONE (OXY IR/ROXICODONE) 5 MG immediate release tablet Take 1 tablet (5 mg total) by mouth every 4 (four) hours as needed for severe pain. Patient not taking: Reported on 09/11/2019 09/10/19   Petrucelli, Glynda Jaeger, PA-C  pantoprazole (PROTONIX) 40 MG tablet Take 1 tablet (40 mg total) by mouth 2 (two) times daily before a meal. 08/31/19   Tat, Shanon Brow, MD  phenazopyridine (PYRIDIUM) 100 MG tablet Take 100 mg by mouth 3 (three) times daily as needed for pain.     [provider]  predniSONE (DELTASONE) 20 MG tablet 2 qd for 5 days 09/11/19   Luking, Elayne Snare, MD  PROAIR HFA 108 (90 Base) MCG/ACT inhaler INHALE 2 PUFFS INTO THE LUNGS EVERY 6 HOURS AS NEEDED. Patient taking differently: Inhale 2 puffs into the lungs every 6 (six) hours as needed for wheezing or shortness of breath.  06/14/17   Kathyrn Drown, MD  promethazine (PHENERGAN) 25 MG tablet TAKE ONE TABLET BY MOUTH EVERY 6 HOURS AS NEEDED. Patient taking differently: Take 25 mg by mouth every 6 (six) hours as needed for nausea or vomiting.  07/03/18   Estill Dooms, NP  topiramate (TOPAMAX) 50 MG tablet TAKE ONE IN THE AM AND 2 AT NIGHT 09/12/19   Cloria Spring, MD  valACYclovir (VALTREX) 1000 MG tablet Take 1 bid for 10 days then Take  1 daily for suppression Patient taking differently: Take 500 mg by mouth See admin instructions. Take 1 twice daily  for 10 days then Take  1 daily for suppression 05/03/19   Derrek Monaco A, NP  zolpidem (AMBIEN) 10 MG tablet Take 1 tablet (10 mg total) by mouth at bedtime. 07/25/19 09/10/19  Cloria Spring, MD    Family History Family History  Problem Relation Age of Onset    Hemochromatosis Maternal Grandmother  Migraines Maternal Grandmother    Cancer Maternal Grandmother    Breast cancer Maternal Grandmother    Hypertension Father    Diabetes Father    Coronary artery disease Father    Migraines Paternal Grandmother    Breast cancer Paternal Grandmother    Cancer Mother        breast   Hemochromatosis Mother    Breast cancer Mother    Depression Mother    Anxiety disorder Mother    Coronary artery disease Paternal Grandfather    Anxiety disorder Brother    Bipolar disorder Brother    Healthy Daughter    Healthy Son     Social History Social History   Tobacco Use   Smoking status: Former Smoker    Packs/day: 0.25    Years: 6.00    Pack years: 1.50    Types: E-cigarettes    Start date: 05/2018   Smokeless tobacco: Never Used  Substance Use Topics   Alcohol use: Yes    Alcohol/week: 0.0 standard drinks    Comment: 1-2 beers a couple times a month    Drug use: Not Currently    Types: Marijuana    Comment: Previously opioid addiction went through rehab     Allergies   Gabapentin, Metoclopramide hcl, Tramadol, Ativan [lorazepam], Latex, Lyrica [pregabalin], Trazodone and nefazodone, Zofran [ondansetron], Sulfa antibiotics, Butrans [buprenorphine], Nucynta [tapentadol], Propofol, Tape, and Toradol [ketorolac tromethamine]   Review of Systems Review of Systems  Constitutional: Positive for activity change, appetite change and chills. Negative for fever.  HENT: Negative for congestion.   Respiratory: Negative for cough, chest tightness and shortness of breath.   Cardiovascular: Negative for chest pain and leg swelling.  Gastrointestinal: Positive for abdominal pain, diarrhea, nausea and vomiting.  Genitourinary: Negative for dysuria and hematuria.  Musculoskeletal: Negative for arthralgias, back pain and myalgias.  Neurological: Positive for weakness.    all other systems are negative except as noted in the HPI  and PMH.    Physical Exam Updated Vital Signs BP 110/86 (BP Location: Right Arm)    Pulse 71    Temp 98.6 F (37 C) (Oral)    Resp 16    SpO2 94%   Physical Exam Vitals signs and nursing note reviewed.  Constitutional:      General: She is not in acute distress.    Appearance: She is well-developed. She is obese.  HENT:     Head: Normocephalic and atraumatic.     Mouth/Throat:     Pharynx: No oropharyngeal exudate.  Eyes:     Conjunctiva/sclera: Conjunctivae normal.     Pupils: Pupils are equal, round, and reactive to light.  Neck:     Musculoskeletal: Normal range of motion and neck supple.     Comments: No meningismus. Cardiovascular:     Rate and Rhythm: Normal rate and regular rhythm.     Heart sounds: Normal heart sounds. No murmur.  Pulmonary:     Effort: Pulmonary effort is normal. No respiratory distress.     Breath sounds: Normal breath sounds.  Abdominal:     Palpations: Abdomen is soft.     Tenderness: There is abdominal tenderness. There is guarding. There is no rebound.     Comments: Epigastric and right upper quadrant tenderness with guarding  Musculoskeletal: Normal range of motion.        General: No tenderness.  Skin:    General: Skin is warm.  Neurological:     Mental Status: She is alert and  oriented to person, place, and time.     Cranial Nerves: No cranial nerve deficit.     Motor: No abnormal muscle tone.     Coordination: Coordination normal.     Comments:  5/5 strength throughout. CN 2-12 intact.Equal grip strength.   Psychiatric:        Behavior: Behavior normal.      ED Treatments / Results  Labs (all labs ordered are listed, but only abnormal results are displayed) Labs Reviewed  COMPREHENSIVE METABOLIC PANEL - Abnormal; Notable for the following components:      Result Value   AST 63 (*)    ALT 90 (*)    Alkaline Phosphatase 137 (*)    All other components within normal limits  URINALYSIS, ROUTINE W REFLEX MICROSCOPIC - Abnormal;  Notable for the following components:   Color, Urine STRAW (*)    All other components within normal limits  SARS CORONAVIRUS 2 (TAT 6-24 HRS)  LIPASE, BLOOD  CBC  D-DIMER, QUANTITATIVE (NOT AT [facility])  COMPREHENSIVE METABOLIC PANEL  LIPASE, BLOOD  CBC  MAGNESIUM  POC URINE PREG, ED  TROPONIN I (HIGH SENSITIVITY)    EKG EKG Interpretation  Date/Time:  Wednesday September 19 2019 00:16:57 EDT Ventricular Rate:  82 PR Interval:    QRS Duration: 79 QT Interval:  387 QTC Calculation: 452 R Axis:   67 Text Interpretation:  Sinus rhythm No significant change was found Rate slower Confirmed by Ezequiel Essex [phone]) on 09/19/2019 12:25:40 AM   Radiology Ct Abdomen Pelvis Wo Contrast  Result Date: 09/19/2019 CLINICAL DATA:  33 year old female with abdominal pain nausea and vomiting. EXAM: CT ABDOMEN AND PELVIS WITHOUT CONTRAST TECHNIQUE: Multidetector CT imaging of the abdomen and pelvis was performed following the standard protocol without IV contrast. COMPARISON:  CT Abdomen and Pelvis 08/25/2019. FINDINGS: Lower chest: Questionable pulmonary hyperinflation but otherwise negative. Hepatobiliary: Surgically absent gallbladder. Negative noncontrast liver. Pancreas: Negative. Spleen: Negative. Adrenals/Urinary Tract: Normal adrenal glands. Negative noncontrast kidneys. No nephrolithiasis. No hydronephrosis or perinephric stranding. Decompressed proximal ureters. Unremarkable urinary bladder. Stomach/Bowel: Redundant sigmoid colon which extends above the umbilicus. Retained stool from the right colon to the proximal sigmoid. Redundant splenic flexure. No large bowel inflammation. Normal appendix on coronal image 39. Negative distal small bowel. Fluid-filled but nondilated small bowel. Previous gastric bypass appear stable. No free air, free fluid. Vascular/Lymphatic: Vascular patency is not evaluated in the absence of IV contrast. No calcified atherosclerosis. No lymphadenopathy. Reproductive:  Stable, negative. Other: Trace if any pelvic free fluid. Musculoskeletal: T12 vertebral augmentation again noted. No acute osseous abnormality identified. IMPRESSION: 1. No urinary calculus or obstructive uropathy. 2. No acute or inflammatory process identified in the non-contrast abdomen or pelvis. Normal appendix. Prior gastric bypass. Electronically Signed   By: Genevie Ann M.D.   On: 09/19/2019 01:53   Dg Chest 2 View  Result Date: 09/19/2019 CLINICAL DATA:  chest pain EXAM: CHEST - 2 VIEW COMPARISON:  08/30/2019 FINDINGS: The heart size and mediastinal contours are within normal limits. Both lungs are clear. Post augmentation changes of the lower spine. IMPRESSION: No active cardiopulmonary disease. Electronically Signed   By: Donavan Foil M.D.   On: 09/19/2019 01:17    Procedures Procedures (including critical care time)  Medications Ordered in ED Medications  sodium chloride flush (NS) 0.9 % injection 3 mL (has no administration in time range)  sodium chloride 0.9 % bolus 1,000 mL (has no administration in time range)  fentaNYL (SUBLIMAZE) injection 50 mcg (has no administration  in time range)  ondansetron (ZOFRAN-ODT) disintegrating tablet 4 mg (4 mg Oral Given 09/18/19 1702)     Initial Impression / Assessment and Plan / ED Course  I have reviewed the triage vital signs and the nursing notes.  Pertinent labs & imaging results that were available during my care of the patient were reviewed by me and considered in my medical decision making (see chart for details).       Patient with upper abdominal pain, nausea and vomiting with extensive work-up in the past. Patient had right upper quadrant ultrasound, CT, MRCP, EGD which are all reassuring.  Patient arrives in no respiratory distress with stable vital signs.  No fever.   Labs are leukocytosis.  No fever.  Lipase normal.  Dr. Olevia Perches note reviewed.  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   He recommends transfer to [facility] if patient is LFTs are elevated.  Patient does have mild transaminitis.  Discussed with Dr. Oneida Alar on-call for Dr. Laural Golden agrees with transfer to [facility] per his recommendations.  D/w [facility] hospitalist Dr. Debara Pickett.  She states there were no beds available for transfer tonight but there possibly will be after 7 AM.  Gastroenterology may be able to arrange a "day trip" in the morning. She recommends admission locally for symptom control with possible transfer tomorrow.  Patient still with ongoing pain and nausea though no vomiting in the ED.  Her work-up is again reassuring with normal EKG and chest x-ray.  Negative troponin and d-dimer.  Low suspicion for ACS, PE, or aortic dissection.  Discussed with patient that no [facility] beds are available.  She is agreeable to admission locally for symptom control and transfer tomorrow when able.  Discussed with Dr. Myna Hidalgo.  Final Clinical Impressions(s) / ED Diagnoses   Final diagnoses:  Recurrent abdominal pain    ED Discharge Orders    None       Truda Staub, Annie Main, MD 09/19/19 [phone]

## 2019-09-18 NOTE — ED Triage Notes (Signed)
Per EMS patient from home. Pt complains of abdominal pain nausea and vomiting. Pt is followed by GI, Dr. Matthew Saras. Patient was recently admitted to [facility] for intractable nausea.

## 2019-09-18 NOTE — Telephone Encounter (Signed)
I called patient's mother Santiago Glad. Our office has been called multiple times by her and her daughter Cecilie Wincek.  [place] has multiple issues including right upper quadrant abdominal pain felt to be due to microlithiasis or sphincter of Oddi dysfunction.  She has had well-documented intermittent bump in her transaminases with pain. She has been referred to [facility].  I talk with Dr. Zenia Resides personally and requested visit as soon as possible.  They have not heard from his office yet. Patient is in emergency room as we speak. If her transaminases are elevated I would recommend that she be transferred to [facility].

## 2019-09-18 NOTE — ED Notes (Signed)
Iv ultrasound successful for blood draw but IV infiltrated.

## 2019-09-19 ENCOUNTER — Emergency Department (HOSPITAL_COMMUNITY): Payer: Medicaid Other

## 2019-09-19 ENCOUNTER — Other Ambulatory Visit: Payer: Self-pay

## 2019-09-19 ENCOUNTER — Encounter (HOSPITAL_COMMUNITY): Payer: Self-pay | Admitting: Family Medicine

## 2019-09-19 DIAGNOSIS — R109 Unspecified abdominal pain: Secondary | ICD-10-CM

## 2019-09-19 DIAGNOSIS — E860 Dehydration: Secondary | ICD-10-CM | POA: Diagnosis not present

## 2019-09-19 DIAGNOSIS — Z9851 Tubal ligation status: Secondary | ICD-10-CM | POA: Diagnosis not present

## 2019-09-19 DIAGNOSIS — Z20828 Contact with and (suspected) exposure to other viral communicable diseases: Secondary | ICD-10-CM | POA: Diagnosis not present

## 2019-09-19 DIAGNOSIS — G894 Chronic pain syndrome: Secondary | ICD-10-CM | POA: Diagnosis present

## 2019-09-19 DIAGNOSIS — Q211 Atrial septal defect: Secondary | ICD-10-CM | POA: Diagnosis not present

## 2019-09-19 DIAGNOSIS — R1011 Right upper quadrant pain: Secondary | ICD-10-CM | POA: Diagnosis not present

## 2019-09-19 DIAGNOSIS — Z9049 Acquired absence of other specified parts of digestive tract: Secondary | ICD-10-CM | POA: Diagnosis not present

## 2019-09-19 DIAGNOSIS — G43709 Chronic migraine without aura, not intractable, without status migrainosus: Secondary | ICD-10-CM | POA: Diagnosis present

## 2019-09-19 DIAGNOSIS — G629 Polyneuropathy, unspecified: Secondary | ICD-10-CM | POA: Diagnosis present

## 2019-09-19 DIAGNOSIS — Z87891 Personal history of nicotine dependence: Secondary | ICD-10-CM | POA: Diagnosis not present

## 2019-09-19 DIAGNOSIS — M329 Systemic lupus erythematosus, unspecified: Secondary | ICD-10-CM | POA: Diagnosis present

## 2019-09-19 DIAGNOSIS — F418 Other specified anxiety disorders: Secondary | ICD-10-CM | POA: Diagnosis not present

## 2019-09-19 DIAGNOSIS — R7401 Elevation of levels of liver transaminase levels: Secondary | ICD-10-CM | POA: Diagnosis present

## 2019-09-19 DIAGNOSIS — Z9884 Bariatric surgery status: Secondary | ICD-10-CM | POA: Diagnosis not present

## 2019-09-19 DIAGNOSIS — R1111 Vomiting without nausea: Secondary | ICD-10-CM | POA: Diagnosis not present

## 2019-09-19 DIAGNOSIS — K219 Gastro-esophageal reflux disease without esophagitis: Secondary | ICD-10-CM | POA: Diagnosis present

## 2019-09-19 DIAGNOSIS — F988 Other specified behavioral and emotional disorders with onset usually occurring in childhood and adolescence: Secondary | ICD-10-CM | POA: Diagnosis present

## 2019-09-19 DIAGNOSIS — R748 Abnormal levels of other serum enzymes: Secondary | ICD-10-CM

## 2019-09-19 DIAGNOSIS — F410 Panic disorder [episodic paroxysmal anxiety] without agoraphobia: Secondary | ICD-10-CM | POA: Diagnosis present

## 2019-09-19 DIAGNOSIS — K760 Fatty (change of) liver, not elsewhere classified: Secondary | ICD-10-CM | POA: Diagnosis present

## 2019-09-19 DIAGNOSIS — E669 Obesity, unspecified: Secondary | ICD-10-CM | POA: Diagnosis present

## 2019-09-19 DIAGNOSIS — N3010 Interstitial cystitis (chronic) without hematuria: Secondary | ICD-10-CM | POA: Diagnosis present

## 2019-09-19 DIAGNOSIS — G8929 Other chronic pain: Secondary | ICD-10-CM | POA: Diagnosis present

## 2019-09-19 DIAGNOSIS — G2581 Restless legs syndrome: Secondary | ICD-10-CM | POA: Diagnosis present

## 2019-09-19 DIAGNOSIS — R112 Nausea with vomiting, unspecified: Secondary | ICD-10-CM | POA: Diagnosis not present

## 2019-09-19 DIAGNOSIS — Z9089 Acquired absence of other organs: Secondary | ICD-10-CM | POA: Diagnosis not present

## 2019-09-19 DIAGNOSIS — M797 Fibromyalgia: Secondary | ICD-10-CM | POA: Diagnosis present

## 2019-09-19 DIAGNOSIS — R079 Chest pain, unspecified: Secondary | ICD-10-CM | POA: Diagnosis not present

## 2019-09-19 DIAGNOSIS — K289 Gastrojejunal ulcer, unspecified as acute or chronic, without hemorrhage or perforation: Secondary | ICD-10-CM | POA: Diagnosis present

## 2019-09-19 DIAGNOSIS — K838 Other specified diseases of biliary tract: Secondary | ICD-10-CM | POA: Diagnosis not present

## 2019-09-19 DIAGNOSIS — Z6832 Body mass index (BMI) 32.0-32.9, adult: Secondary | ICD-10-CM | POA: Diagnosis not present

## 2019-09-19 LAB — LIPASE, BLOOD: Lipase: 24 U/L (ref 11–51)

## 2019-09-19 LAB — COMPREHENSIVE METABOLIC PANEL
ALT: 64 U/L — ABNORMAL HIGH (ref 0–44)
AST: 54 U/L — ABNORMAL HIGH (ref 15–41)
Albumin: 3.7 g/dL (ref 3.5–5.0)
Alkaline Phosphatase: 116 U/L (ref 38–126)
Anion gap: 10 (ref 5–15)
BUN: 7 mg/dL (ref 6–20)
CO2: 18 mmol/L — ABNORMAL LOW (ref 22–32)
Calcium: 8.8 mg/dL — ABNORMAL LOW (ref 8.9–10.3)
Chloride: 113 mmol/L — ABNORMAL HIGH (ref 98–111)
Creatinine, Ser: 0.73 mg/dL (ref 0.44–1.00)
GFR calc Af Amer: >60 mL/min (ref >60)
GFR calc non Af Amer: >60 mL/min (ref >60)
Glucose, Bld: 85 mg/dL (ref 70–99)
Potassium: 5.2 mmol/L — ABNORMAL HIGH (ref 3.5–5.1)
Sodium: 141 mmol/L (ref 135–145)
Total Bilirubin: 0.8 mg/dL (ref 0.3–1.2)
Total Protein: 6.6 g/dL (ref 6.5–8.1)

## 2019-09-19 LAB — CBC
HCT: 37.1 % (ref 36.0–46.0)
Hemoglobin: 12.9 g/dL (ref 12.0–15.0)
MCH: 32.9 pg (ref 26.0–34.0)
MCHC: 34.8 g/dL (ref 30.0–36.0)
MCV: 94.6 fL (ref 80.0–100.0)
Platelets: 257 10*3/uL (ref 150–400)
RBC: 3.92 MIL/uL (ref 3.87–5.11)
RDW: 12 % (ref 11.5–15.5)
WBC: 6.6 10*3/uL (ref 4.0–10.5)
nRBC: 0 % (ref 0.0–0.2)

## 2019-09-19 LAB — SARS CORONAVIRUS 2 (TAT 6-24 HRS): SARS Coronavirus 2: NEGATIVE

## 2019-09-19 LAB — TROPONIN I (HIGH SENSITIVITY): Troponin I (High Sensitivity): <2 ng/L (ref <18)

## 2019-09-19 LAB — D-DIMER, QUANTITATIVE: D-Dimer, Quant: 0.36 ug/mL-FEU (ref 0.00–0.50)

## 2019-09-19 LAB — MAGNESIUM: Magnesium: 2.2 mg/dL (ref 1.7–2.4)

## 2019-09-19 MED ORDER — CLONAZEPAM 0.5 MG PO TABS
0.5000 mg | ORAL_TABLET | Freq: Three times a day (TID) | ORAL | Status: DC | PRN
  Administered 2019-09-19 – 2019-09-20 (×3): 0.5 mg via ORAL
  Filled 2019-09-19 (×3): qty 1

## 2019-09-19 MED ORDER — ONDANSETRON HCL 4 MG/2ML IJ SOLN
4.0000 mg | Freq: Four times a day (QID) | INTRAMUSCULAR | Status: DC | PRN
  Administered 2019-09-19: 4 mg via INTRAVENOUS
  Filled 2019-09-19: qty 2

## 2019-09-19 MED ORDER — TOPIRAMATE 25 MG PO TABS
50.0000 mg | ORAL_TABLET | Freq: Once | ORAL | Status: AC
  Administered 2019-09-19: 50 mg via ORAL
  Filled 2019-09-19: qty 2

## 2019-09-19 MED ORDER — DULOXETINE HCL 60 MG PO CPEP
60.0000 mg | ORAL_CAPSULE | Freq: Once | ORAL | Status: AC
  Administered 2019-09-19: 21:00:00 60 mg via ORAL
  Filled 2019-09-19: qty 1

## 2019-09-19 MED ORDER — FENTANYL CITRATE (PF) 100 MCG/2ML IJ SOLN: 25.0000 ug | INTRAMUSCULAR | Status: DC | PRN

## 2019-09-19 MED ORDER — SODIUM CHLORIDE 0.9 % IV BOLUS
1000.0000 mL | Freq: Once | INTRAVENOUS | Status: AC
  Administered 2019-09-19: 1000 mL via INTRAVENOUS

## 2019-09-19 MED ORDER — FAMOTIDINE IN NACL 20-0.9 MG/50ML-% IV SOLN
20.0000 mg | Freq: Two times a day (BID) | INTRAVENOUS | Status: DC
  Administered 2019-09-19 – 2019-09-20 (×4): 20 mg via INTRAVENOUS
  Filled 2019-09-19 (×4): qty 50

## 2019-09-19 MED ORDER — ALBUTEROL SULFATE (2.5 MG/3ML) 0.083% IN NEBU
2.5000 mg | INHALATION_SOLUTION | Freq: Four times a day (QID) | RESPIRATORY_TRACT | Status: DC | PRN
  Administered 2019-09-19: 2.5 mg via RESPIRATORY_TRACT
  Filled 2019-09-19 (×2): qty 3

## 2019-09-19 MED ORDER — SODIUM CHLORIDE 0.9 % IV SOLN
INTRAVENOUS | Status: AC
  Administered 2019-09-19: 04:00:00 via INTRAVENOUS

## 2019-09-19 MED ORDER — ZOLPIDEM TARTRATE 5 MG PO TABS
5.0000 mg | ORAL_TABLET | Freq: Once | ORAL | Status: AC
  Administered 2019-09-19: 5 mg via ORAL
  Filled 2019-09-19: qty 1

## 2019-09-19 MED ORDER — FENTANYL CITRATE (PF) 100 MCG/2ML IJ SOLN
50.0000 ug | Freq: Once | INTRAMUSCULAR | Status: AC
  Administered 2019-09-19: 50 ug via INTRAVENOUS
  Filled 2019-09-19: qty 2

## 2019-09-19 MED ORDER — ENOXAPARIN SODIUM 40 MG/0.4ML ~~LOC~~ SOLN
40.0000 mg | SUBCUTANEOUS | Status: DC
  Administered 2019-09-19 – 2019-09-20 (×2): 40 mg via SUBCUTANEOUS
  Filled 2019-09-19 (×2): qty 0.4

## 2019-09-19 MED ORDER — HYDROMORPHONE HCL 1 MG/ML IJ SOLN
0.5000 mg | INTRAMUSCULAR | Status: DC | PRN
  Administered 2019-09-19 – 2019-09-20 (×9): 0.5 mg via INTRAVENOUS
  Filled 2019-09-19 (×9): qty 0.5

## 2019-09-19 MED ORDER — PROMETHAZINE HCL 25 MG/ML IJ SOLN
12.5000 mg | Freq: Four times a day (QID) | INTRAMUSCULAR | Status: DC | PRN
  Administered 2019-09-19 – 2019-09-20 (×4): 12.5 mg via INTRAVENOUS
  Filled 2019-09-19 (×5): qty 1

## 2019-09-19 NOTE — Progress Notes (Signed)
Initial Nutrition Assessment  DOCUMENTATION CODES:   Obesity unspecified  INTERVENTION:  Monitor for diet advancement; provide nutrition supplement when appropriate MVI daily  NUTRITION DIAGNOSIS:   Inadequate oral intake related to altered GI function as evidenced by energy intake < or equal to 50% for > or equal to 1 month, per patient/family report.  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  Patient admitted for observation and symptom control; pending [facility] transfer.  Per chart review, patient in the hospital with similar symptoms 9/21-9/25; negative MRCP, CT abdomen/pelvis with no acute findings, abdominal US unrevealing, EGD with small anastomotic ulcer. She was referred to [facility], for outpatient follow up and ERCP, but has not been there yet. Attempts made to admit patient to [facility], unfortunately no beds available; recommend [facility] admission for symptom control and transfer when able.  Patient reports feeling very tired this morning at RD visit. She endorses very little po intake over the past 3 weeks and stated that she was only able to tolerate Pedialyte. When feeling well patient reports daily MVI and recalls eating lots of salads (bagged salad with canned tuna as a favorite) states that she eats very little sweets and avoids high fat  foods. Patient endorses recent weight losses; she stated that she has noticed her clothes becoming too big for her. Patient unsure of of usual body weight and reports that she does not keep a scale in her home because she does not want to teach her daughter to become obsessed with weight.    Patient had gastric bypass surgery in 2007 and was seen by an RD in January 2018 during a hospital admission. Per review of RD note,  patient admitted to non-compliance with daily vitamin regimen and reported consuming a regular diet with no restrictions. Education was provided on the importance of serving sizes and po intake of daily vitamins.  I/Os: +2022 ml since admit Current wt 90.6 kg (199.3 lb) Weight history reviewed: 83.9 kg (184.6 lb) - 90.8 kg (199.8 lb) over the past 4 months.  Medications reviewed and include: pepcid, zofran, phenergan, fentanyl  Labs: K 5.2 Ca 8.8 AST 54 ALT 64  NUTRITION - FOCUSED PHYSICAL EXAM: Mild depletions to temporal, orbital, and buccal regions   Diet Order:   Diet Order            Diet NPO time specified Except for: Sips with Meds, Ice Chips  Diet effective now              EDUCATION NEEDS:   No education needs have been identified at this time  Skin:     Last BM:  10/13  Height:   Ht Readings from Last 1 Encounters:  09/19/19 5\' 6"  (1.676 m)    Weight:   Wt Readings from Last 1 Encounters:  09/19/19 90.6 kg    Ideal Body Weight:  59.1 kg  BMI:  Body  mass index is 32.24 kg/m.  Estimated Nutritional Needs:   Kcal:  1970-2130 (MSJ 1.2-1.3)  Protein:  99-107  Fluid:  >/= 2 L/day   Lajuan Lines, RD, LDN Clinical Nutrition Office [phone] After Hours/Weekend Pager: [phone]

## 2019-09-19 NOTE — H&P (Signed)
History and Physical    Penny [name] JXB:[id number] DOB: 1986/10/25 DOA: 09/18/2019  PCP: Kathyrn Drown, MD   Patient coming from: Home   Chief Complaint: Abdominal pain, N/V   HPI: Penny [name] is a 33 y.o. 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  Patient reports that the abdominal pain is mainly in the upper abdomen and right upper quadrant, constant, associated with nausea and nonbloody vomiting, reportedly going on for months, but much worse for the past few days.  She reports being unable to eat or drink much of anything in the past few days due to the symptoms.  She has been able to tolerate some Pedialyte at home.  She was in the hospital with similar symptoms approximately 3 weeks ago, had extensive work-up that included negative MRCP, CT abdomen/pelvis with no acute findings, right upper quadrant ultrasound was unrevealing, and she had EGD with small anastomotic ulcer.  She was referred to gastroenterology at [facility], but has not yet been seen there.  ED Course: Upon arrival to the ED, patient is found to be afebrile, saturating well on room air, and with blood pressure 96/59.  EKG features of sinus rhythm and chest x-ray is negative for acute cardiopulmonary disease.  Chemistry panel notable for alkaline phosphatase 137, AST 63, and ALT 90.  CBC is unremarkable.  CT of the abdomen and pelvis is negative for acute or inflammatory process.  Patient was given 2 L normal saline, Zofran, and fentanyl in the emergency department.  Attempts were made to admit the patient to [facility], but there  was no bed available at that facility and it was recommended that the patient be admitted here for symptom control and then transfer when able.  Review of Systems:  All other systems reviewed and apart from HPI, are negative.  Past Medical History:  Diagnosis Date  . ADD (attention deficit disorder)   . Anemia 2011   2o to GASTRIC BYPASS  . Anginal pain ([place])   . Anxiety   . Blood transfusion without reported diagnosis   . Chronic daily headache   . Depression   . Depression   . Dysmenorrhea 09/18/2013  . Dysrhythmia   . Elevated liver enzymes JUL 2011ALK PHOS 111-127 AST  143-267 ALT  213-321T BILI 0.6  ALB  3.7-4.06 Jun 2011 ALK PHOS 118 AST 24 ALT 42 T BILI 0.4 ALB 3.9  . Encounter for drug rehabilitation    at behavioral health for opioid addiction about 4 years ago  . Family history of adverse reaction to anesthesia    'dad had to be kept on pump for breathing for morphine'  . Fatty liver   . Fibroid 01/18/2017  . Fibromyalgia   . Gastric bypass status for obesity   . Gastritis JULY 2011  . Heart murmur   . 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 History of Holter monitoring   . Hx of opioid abuse ([place])    for about 4 years, about 4 years ago  . Interstitial cystitis   . Iron deficiency anemia 07/23/2010  . Irritable bowel syndrome 2012 DIARRHEA   JUN 2012 TTG IgA 14.9  .  IUD FEB 2010  . Lupus ([place])   . Menorrhagia 07/18/2013  . Migraines   . Obesity (BMI 30-39.9) 2011 228 LBS BMI 36.8  . Ovarian cyst   . Patient desires pregnancy 09/18/2013  . Polyneuropathy   . PONV (postoperative nausea and vomiting) seizure post-operatively   seizure following ankle surgery  . Potassium (K) deficiency   . Pregnant 12/25/2013  . Psychiatric pseudoseizure   . RLQ abdominal pain 07/18/2013  . Sciatica of left side 11/14/2014  . 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 Stress 09/18/2013    Past Surgical History:  Procedure Laterality Date  . BIOPSY   04/10/2015   Procedure: BIOPSY;  Surgeon: Daneil Dolin, MD;  Location: [facility];  Service: Endoscopy;;  . cath self every nite     for sodium bicarb injection (discontinued 2013)  . CHOLECYSTECTOMY  2005   biliary dyskinesia  . COLONOSCOPY  JUN 2012 ABD PN/DIARRHEA WITH PROPOFOL   NL COLON  . DILATION AND CURETTAGE OF UTERUS    . DILITATION & CURRETTAGE/HYSTROSCOPY WITH NOVASURE ABLATION N/A 03/24/2017   Procedure: DILATATION & CURETTAGE/HYSTEROSCOPY WITH NOVASURE ENDOMETRIAL ABLATION;  Surgeon: Jonnie Kind, MD;  Location: [facility];  Service: Gynecology;  Laterality: N/A;  . ESOPHAGEAL DILATION N/A 04/10/2015   Procedure: ESOPHAGEAL DILATION WITH 54FR MALONEY DILATOR;  Surgeon: Daneil Dolin, MD;  Location: [facility];  Service: Endoscopy;  Laterality: N/A;  . ESOPHAGOGASTRODUODENOSCOPY    . ESOPHAGOGASTRODUODENOSCOPY (EGD) WITH PROPOFOL N/A 04/10/2015   Procedure: ESOPHAGOGASTRODUODENOSCOPY (EGD) WITH PROPOFOL;  Surgeon: Daneil Dolin, MD;  Location: [facility];  Service: Endoscopy;  Laterality: N/A;  . ESOPHAGOGASTRODUODENOSCOPY (EGD) WITH PROPOFOL N/A 12/06/2016   Procedure: ESOPHAGOGASTRODUODENOSCOPY (EGD) WITH PROPOFOL;  Surgeon: Danie Binder, MD;  [facility];  Service: Endoscopy;  Laterality: N/A;  . ESOPHAGOGASTRODUODENOSCOPY (EGD) WITH PROPOFOL N/A 04/27/2019   Procedure: ESOPHAGOGASTRODUODENOSCOPY (EGD) WITH PROPOFOL;  Surgeon: Rogene Houston, MD;  [facility];  Service: Endoscopy;  Laterality: N/A;  730  . ESOPHAGOGASTRODUODENOSCOPY (EGD) WITH PROPOFOL N/A 08/31/2019   Procedure: ESOPHAGOGASTRODUODENOSCOPY (EGD) WITH PROPOFOL;  Surgeon: Rogene Houston, MD;  [facility];  Service: Endoscopy;  Laterality: N/A;  . GAB  2007   in [place]-POUCH 5 CM  . GASTRIC BYPASS  06/2006  . HYSTEROSCOPY W/D&C N/A 09/12/2014   Procedure: DILATATION AND CURETTAGE /HYSTEROSCOPY;  Surgeon: Jonnie Kind, MD;  Location: [facility];  Service: Gynecology;  Laterality: N/A;   . KYPHOPLASTY N/A 08/02/2018   Procedure: KYPHOPLASTY T12;  Surgeon: Melina Schools, MD;  Location: [place];  Service: Orthopedics;  Laterality: N/A;  60 mins  . LAPAROSCOPIC TUBAL LIGATION Bilateral 03/24/2017   Procedure: LAPAROSCOPIC TUBAL LIGATION ([place] Rings);  Surgeon: Jonnie Kind, MD;  Location: [facility];  Service: Gynecology;  Laterality: Bilateral;  . REPAIR VAGINAL CUFF N/A 07/30/2014   Procedure: REPAIR VAGINAL CUFF;  Surgeon: Mora Bellman, MD;  Location: [place] [facility];  Service: Gynecology;  Laterality: N/A;  . SAVORY DILATION  06/20/2012   Dr. Barnie Alderman gastritis/Ulcer in the mid jejunum. Empiric dilation.   . SURAL NERVE BX Left 02/25/2016   Procedure: LEFT SURAL NERVE BIOPSY;  Surgeon: Jovita Gamma, MD;  Location: [place] [facility];  Service: Neurosurgery;  Laterality: Left;  Left sural nerve biopsy  . TONSILLECTOMY    . TONSILLECTOMY AND ADENOIDECTOMY    . UPPER GASTROINTESTINAL ENDOSCOPY  JULY 2011 NAUSEA-D125,V6, PH 25   Bx; GASTRITIS, POUCH-5 CM LONG  . WISDOM TOOTH  EXTRACTION       reports that she has quit smoking. Her smoking use included e-cigarettes. She started smoking about 16 months ago. She has a 1.50 pack-year smoking history. She has never used smokeless tobacco. She reports current alcohol use. She reports previous drug use. Drug: Marijuana.  Allergies  Allergen Reactions  . Gabapentin Other (See Comments)    Pt states that she was unresponsive after taking this medication, but her vitals remained stable.    . Metoclopramide Hcl Anxiety and Other (See Comments)    Pt states that she felt like she was trapped in a box, and could not get out.  Pt also states that she had temporary loss of movement, weakness, and tingling.    . Tramadol Other (See Comments)    Seizures  . Ativan [Lorazepam] Other (See Comments)    combative  . Latex Itching, Rash and Other (See Comments)    Burns  . Lyrica [Pregabalin] Other (See Comments)    suicidal  . Trazodone And Nefazodone  Other (See Comments)    Makes pt "like a zombie"  . Zofran [Ondansetron] Other (See Comments)    Migraines in high doses    . Sulfa Antibiotics Hives  . Butrans [Buprenorphine] Rash    Patch caused rash, didn't help with pain  . Nucynta [Tapentadol] Nausea And Vomiting  . Propofol Nausea And Vomiting  . Tape Itching, Rash and Other (See Comments)    Please use paper tape  . Toradol [Ketorolac Tromethamine] Anxiety    Family History  Problem Relation Age of Onset  . Hemochromatosis Maternal Grandmother   . Migraines Maternal Grandmother   . Cancer Maternal Grandmother   . Breast cancer Maternal Grandmother   . Hypertension Father   . Diabetes Father   . Coronary artery disease Father   . Migraines Paternal Grandmother   . Breast cancer Paternal Grandmother   . Cancer Mother        breast  . Hemochromatosis Mother   . Breast cancer Mother   . Depression Mother   . Anxiety disorder Mother   . Coronary artery disease Paternal Grandfather   . Anxiety disorder Brother   . Bipolar disorder Brother   . Healthy Daughter   . Healthy Son      Prior to Admission medications   Medication Sig Start Date End Date Taking? Authorizing Provider  albuterol (PROVENTIL) (2.5 MG/3ML) 0.083% nebulizer solution Take 3 mLs (2.5 mg total) by nebulization every 4 (four) hours as needed for wheezing. 09/11/19   Kathyrn Drown, MD  albuterol (VENTOLIN HFA) 108 (90 Base) MCG/ACT inhaler Inhale 2 puffs into the lungs every 6 (six) hours as needed for wheezing or shortness of breath. 08/31/19   Orson Eva, MD  amphetamine-dextroamphetamine (ADDERALL XR) 30 MG 24 hr capsule TAKE (1) CAPSULE BY MOUTH EVERY DAY. Patient taking differently: Take 30 mg by mouth See admin instructions. TAKE (1) CAPSULE BY MOUTH 4 TIMES WEEKLY 07/25/19   Cloria Spring, MD  clonazePAM (KLONOPIN) 0.5 MG tablet Take 1 tablet (0.5 mg total) by mouth 3 (three) times daily as needed for anxiety. 09/12/19 09/11/20  Cloria Spring, MD   DULoxetine (CYMBALTA) 60 MG capsule Take 1 capsule (60 mg total) by mouth 2 (two) times daily. 07/25/19   Cloria Spring, MD  ondansetron (ZOFRAN ODT) 8 MG disintegrating tablet Take 1 tablet (8 mg total) by mouth every 8 (eight) hours as needed for nausea or vomiting. 08/21/19   [name]  A, MD  ondansetron (ZOFRAN-ODT) 4 MG disintegrating tablet Take 1 tablet (4 mg total) by mouth every 8 (eight) hours as needed for nausea or vomiting. 08/31/19   Tat, Shanon Brow, MD  oxyCODONE (OXY IR/ROXICODONE) 5 MG immediate release tablet Take 1 tablet (5 mg total) by mouth every 4 (four) hours as needed for severe pain. Patient not taking: Reported on 09/11/2019 09/10/19   Petrucelli, Glynda Jaeger, PA-C  pantoprazole (PROTONIX) 40 MG tablet Take 1 tablet (40 mg total) by mouth 2 (two) times daily before a meal. 08/31/19   Tat, Shanon Brow, MD  phenazopyridine (PYRIDIUM) 100 MG tablet Take 100 mg by mouth 3 (three) times daily as needed for pain.     [provider]  predniSONE (DELTASONE) 20 MG tablet 2 qd for 5 days 09/11/19   Luking, Elayne Snare, MD  PROAIR HFA 108 (90 Base) MCG/ACT inhaler INHALE 2 PUFFS INTO THE LUNGS EVERY 6 HOURS AS NEEDED. Patient taking differently: Inhale 2 puffs into the lungs every 6 (six) hours as needed for wheezing or shortness of breath.  06/14/17   Kathyrn Drown, MD  promethazine (PHENERGAN) 25 MG tablet TAKE ONE TABLET BY MOUTH EVERY 6 HOURS AS NEEDED. Patient taking differently: Take 25 mg by mouth every 6 (six) hours as needed for nausea or vomiting.  07/03/18   Estill Dooms, NP  topiramate (TOPAMAX) 50 MG tablet TAKE ONE IN THE AM AND 2 AT NIGHT 09/12/19   Cloria Spring, MD  valACYclovir (VALTREX) 1000 MG tablet Take 1 bid for 10 days then Take  1 daily for suppression Patient taking differently: Take 500 mg by mouth See admin instructions. Take 1 twice daily  for 10 days then Take  1 daily for suppression 05/03/19   Derrek Monaco A, NP  zolpidem (AMBIEN) 10 MG tablet  Take 1 tablet (10 mg total) by mouth at bedtime. 07/25/19 09/10/19  Cloria Spring, MD    Physical Exam: Vitals:   09/18/19 1641 09/19/19 0030  BP: 110/86 (!) 96/59  Pulse: 71   Resp: 16 16  Temp: 98.6 F (37 C)   TempSrc: Oral   SpO2: 94%     Constitutional: NAD, calm  Eyes: PERTLA, lids and conjunctivae normal ENMT: Mucous membranes are moist. Posterior pharynx clear of any exudate or lesions.   Neck: normal, supple, no masses, no thyromegaly Respiratory: no wheezing, no crackles. Normal respiratory effort. No accessory muscle use.  Cardiovascular: S1 & S2 heard, regular rate and rhythm. No extremity edema.   Abdomen: No distension, soft, tender across upper abdomen and on the right, no rebound pain or guarding. Bowel sounds active.  Musculoskeletal: no clubbing / cyanosis. No joint deformity upper and lower extremities.   Skin: no significant rashes, lesions, ulcers. Warm, dry, well-perfused. Neurologic: No facial asymmetry. Sensation intact. Moving all extremities.  Psychiatric: Alert and oriented x 3. Calm, cooperative.    Labs on Admission: I have personally reviewed following labs and imaging studies  CBC: Recent Labs  Lab 09/18/19 2138  WBC 7.0  HGB 13.5  HCT 40.6  MCV 98.1  PLT 546   Basic Metabolic Panel: Recent Labs  Lab 09/18/19 2138  NA 136  K 3.9  CL 104  CO2 22  GLUCOSE 85  BUN 9  CREATININE 0.70  CALCIUM 9.5   GFR: Estimated Creatinine Clearance: 113.9 mL/min (by C-G formula based on SCr of 0.7 mg/dL). Liver Function Tests: Recent Labs  Lab 09/18/19 2138  AST 63*  ALT 90*  ALKPHOS 137*  BILITOT 0.5  PROT 8.0  ALBUMIN 4.5   Recent Labs  Lab 09/18/19 2138  LIPASE 27   No results for input(s): AMMONIA in the last 168 hours. Coagulation Profile: No results for input(s): INR, PROTIME in the last 168 hours. Cardiac Enzymes: No results for input(s): CKTOTAL, CKMB, CKMBINDEX, TROPONINI in the last 168 hours. BNP (last 3 results) No  results for input(s): PROBNP in the last 8760 hours. HbA1C: No results for input(s): HGBA1C in the last 72 hours. CBG: No results for input(s): GLUCAP in the last 168 hours. Lipid Profile: No results for input(s): CHOL, HDL, LDLCALC, TRIG, CHOLHDL, LDLDIRECT in the last 72 hours. Thyroid Function Tests: No results for input(s): TSH, T4TOTAL, FREET4, T3FREE, THYROIDAB in the last 72 hours. Anemia Panel: No results for input(s): VITAMINB12, FOLATE, FERRITIN, TIBC, IRON, RETICCTPCT in the last 72 hours. Urine analysis:    Component Value Date/Time   COLORURINE STRAW (A) 09/18/2019 2224   APPEARANCEUR CLEAR 09/18/2019 2224   APPEARANCEUR Cloudy (A) 03/30/2017 1600   LABSPEC 1.005 09/18/2019 2224   PHURINE 7.0 09/18/2019 2224   GLUCOSEU NEGATIVE 09/18/2019 2224   HGBUR NEGATIVE 09/18/2019 2224   BILIRUBINUR NEGATIVE 09/18/2019 2224   BILIRUBINUR negative 07/14/2019 1421   BILIRUBINUR Negative 03/30/2017 1600   KETONESUR NEGATIVE 09/18/2019 2224   PROTEINUR NEGATIVE 09/18/2019 2224   UROBILINOGEN 0.2 07/14/2019 1421   UROBILINOGEN 4.0 (H) 08/17/2015 1300   NITRITE NEGATIVE 09/18/2019 2224   LEUKOCYTESUR NEGATIVE 09/18/2019 2224   Sepsis Labs: _0 (procalcitonin:4,lacticidven:4) )No results found for this or any previous visit (from the past 240 hour(s)).   Radiological Exams on Admission: Ct Abdomen Pelvis Wo Contrast  Result Date: 09/19/2019 CLINICAL DATA:  33 year old female with abdominal pain nausea and vomiting. EXAM: CT ABDOMEN AND PELVIS WITHOUT CONTRAST TECHNIQUE: Multidetector CT imaging of the abdomen and pelvis was performed following the standard protocol without IV contrast. COMPARISON:  CT Abdomen and Pelvis 08/25/2019. FINDINGS: Lower chest: Questionable pulmonary hyperinflation but otherwise negative. Hepatobiliary: Surgically absent gallbladder. Negative noncontrast liver. Pancreas: Negative. Spleen: Negative. Adrenals/Urinary Tract: Normal adrenal glands.  Negative noncontrast kidneys. No nephrolithiasis. No hydronephrosis or perinephric stranding. Decompressed proximal ureters. Unremarkable urinary bladder. Stomach/Bowel: Redundant sigmoid colon which extends above the umbilicus. Retained stool from the right colon to the proximal sigmoid. Redundant splenic flexure. No large bowel inflammation. Normal appendix on coronal image 39. Negative distal small bowel. Fluid-filled but nondilated small bowel. Previous gastric bypass appear stable. No free air, free fluid. Vascular/Lymphatic: Vascular patency is not evaluated in the absence of IV contrast. No calcified atherosclerosis. No lymphadenopathy. Reproductive: Stable, negative. Other: Trace if any pelvic free fluid. Musculoskeletal: T12 vertebral augmentation again noted. No acute osseous abnormality identified. IMPRESSION: 1. No urinary calculus or obstructive uropathy. 2. No acute or inflammatory process identified in the non-contrast abdomen or pelvis. Normal appendix. Prior gastric bypass. Electronically Signed   By: Genevie Ann M.D.   On: 09/19/2019 01:53   Dg Chest 2 View  Result Date: 09/19/2019 CLINICAL DATA:  chest pain EXAM: CHEST - 2 VIEW COMPARISON:  08/30/2019 FINDINGS: The heart size and mediastinal contours are within normal limits. Both lungs are clear. Post augmentation changes of the lower spine. IMPRESSION: No active cardiopulmonary disease. Electronically Signed   By: Donavan Foil M.D.   On: 09/19/2019 01:17    EKG: Independently reviewed. Sinus rhythm, rate 73, QTc 452 ms.   Assessment/Plan   1. Intractable abdominal pain; nausea and vomiting; elevated transaminases  - Presents with  recurrent severe upper abdominal pain with N/V and mild elevation in transaminases  - CT abd-pelvis with no acute findings  - She follows with local gastroenterologist who suspects microlithiasis vs sphincter of Oddi dysfunction and recommends admission to [facility]; there are no beds available there  currently  - Plan to continue bowel rest and symptom control, transfer to [facility] when bed available    2. Depression with anxiety  - Managed at home with Cymbalta and Klonopin, not currently tolerating oral intake     PPE: mask, face shield  DVT prophylaxis: Lovenox  Code Status: Full  Family Communication: Discussed with patient  Consults called: None  Admission status: Observation     Vianne Bulls, MD [facility] Pager [phone]  If 7PM-7AM, please contact night-coverage [email or web address] Password [email or web address]  09/19/2019, 2:22 AM

## 2019-09-19 NOTE — ED Notes (Signed)
Pt back from CT

## 2019-09-19 NOTE — Progress Notes (Signed)
Patient seen and examined.  Admitted after midnight secondary to intractable nausea/vomiting and abdominal pain.  Patient with underlying history significant for depression, anxiety, history of gastric bypass and recurring episodes of severe upper abdominal pain with elevation in transaminases actively followed by GI service.  Presented once again with uncontrolled abdominal discomfort and intractable nausea and vomiting.  GI service has concerns of active microlithiasis or sphincter of Oddi dysfunction, for which she will require follow-up at tertiary hospital center for ERCP and further evaluation.  No beds available at [facility] at this moment.  Blood work and vital signs are stable after fluid resuscitation.  Patient is still feeling nauseated and having abdominal discomfort mid abdomen and right upper quadrant (7-8 out of 10 in intensity).  Please refer to H&P written by Dr. Myna Hidalgo on 09/19/2019 for further info/details on admission.  Plan: -Continue IV fluids -Continue PRN antiemetics and analgesics -Follow GI service recommendations -Follow LFTs trend.   Barton Dubois MD [phone]

## 2019-09-20 ENCOUNTER — Other Ambulatory Visit: Payer: Self-pay | Admitting: Family Medicine

## 2019-09-20 ENCOUNTER — Telehealth: Payer: Self-pay | Admitting: Family Medicine

## 2019-09-20 DIAGNOSIS — R52 Pain, unspecified: Secondary | ICD-10-CM

## 2019-09-20 DIAGNOSIS — E860 Dehydration: Secondary | ICD-10-CM

## 2019-09-20 DIAGNOSIS — R109 Unspecified abdominal pain: Secondary | ICD-10-CM

## 2019-09-20 DIAGNOSIS — R112 Nausea with vomiting, unspecified: Secondary | ICD-10-CM

## 2019-09-20 DIAGNOSIS — F418 Other specified anxiety disorders: Secondary | ICD-10-CM

## 2019-09-20 LAB — COMPREHENSIVE METABOLIC PANEL
ALT: 50 U/L — ABNORMAL HIGH (ref 0–44)
AST: 34 U/L (ref 15–41)
Albumin: 3.5 g/dL (ref 3.5–5.0)
Alkaline Phosphatase: 110 U/L (ref 38–126)
Anion gap: 8 (ref 5–15)
BUN: 8 mg/dL (ref 6–20)
CO2: 22 mmol/L (ref 22–32)
Calcium: 8.6 mg/dL — ABNORMAL LOW (ref 8.9–10.3)
Chloride: 109 mmol/L (ref 98–111)
Creatinine, Ser: 0.65 mg/dL (ref 0.44–1.00)
GFR calc Af Amer: >60 mL/min (ref >60)
GFR calc non Af Amer: >60 mL/min (ref >60)
Glucose, Bld: 122 mg/dL — ABNORMAL HIGH (ref 70–99)
Potassium: 3.7 mmol/L (ref 3.5–5.1)
Sodium: 139 mmol/L (ref 135–145)
Total Bilirubin: 0.5 mg/dL (ref 0.3–1.2)
Total Protein: 6 g/dL — ABNORMAL LOW (ref 6.5–8.1)

## 2019-09-20 MED ORDER — LEVALBUTEROL HCL 0.63 MG/3ML IN NEBU
0.6300 mg | INHALATION_SOLUTION | Freq: Four times a day (QID) | RESPIRATORY_TRACT | Status: DC
  Administered 2019-09-20 (×3): 0.63 mg via RESPIRATORY_TRACT
  Filled 2019-09-20 (×4): qty 3

## 2019-09-20 MED ORDER — PANTOPRAZOLE SODIUM 40 MG PO TBEC
40.0000 mg | DELAYED_RELEASE_TABLET | Freq: Two times a day (BID) | ORAL | Status: DC
  Administered 2019-09-20: 40 mg via ORAL
  Filled 2019-09-20: qty 1

## 2019-09-20 MED ORDER — OXYCODONE HCL 5 MG PO TABS: 5.0000 mg | ORAL_TABLET | Freq: Four times a day (QID) | ORAL | 0 refills | Status: DC | PRN

## 2019-09-20 MED ORDER — TOPIRAMATE 50 MG PO TABS: 100.0000 mg | ORAL_TABLET | Freq: Two times a day (BID) | ORAL | 0 refills | Status: DC

## 2019-09-20 MED ORDER — ACETAMINOPHEN 325 MG PO TABS: 650.0000 mg | ORAL_TABLET | Freq: Four times a day (QID) | ORAL | Status: DC | PRN

## 2019-09-20 MED ORDER — ONDANSETRON 4 MG PO TBDP: 4.0000 mg | ORAL_TABLET | Freq: Three times a day (TID) | ORAL | 0 refills | Status: DC | PRN

## 2019-09-20 MED ORDER — LEVALBUTEROL HCL 0.63 MG/3ML IN NEBU
0.6300 mg | INHALATION_SOLUTION | RESPIRATORY_TRACT | Status: DC | PRN
  Administered 2019-09-20: 06:00:00 0.63 mg via RESPIRATORY_TRACT
  Filled 2019-09-20: qty 3

## 2019-09-20 NOTE — Telephone Encounter (Signed)
Addendum to the previous note I did speak with Dr.Rehman They are trying the best they can They recommend keeping the appointment in November If having a flareup of this problem to go back to the ER but preferably at [facility]  When the patient comes in on Monday we can discuss small number of pain medication that we can prescribe If patient is going to need long-term pain medication this will be through pain management No we can discuss this further when the patient comes in on Monday

## 2019-09-20 NOTE — Discharge Summary (Signed)
Physician Discharge Summary  Penny [name] [facility] [id number] DOB: 1986-01-18 DOA: 09/18/2019  PCP: Kathyrn Drown, MD  Admit date: 09/18/2019 Discharge date: 09/20/2019  Time spent: 35 minutes  Recommendations for Outpatient Follow-up:  1. Repeat CMET med to follow LFTs, electrolytes and renal function 2. Outpatient referral to pain management clinic to further address chronic pain issues.   Discharge Diagnoses:  Principal Problem:   Intractable abdominal pain Active Problems:   Depression with anxiety   Elevated liver enzymes   Panic disorder without agoraphobia   Intractable nausea and vomiting   Dehydration   Discharge Condition: Stable and improved.  Discharged home with instructions to follow-up with PCP and gastroenterology service.  Diet recommendation: Full liquid/soft low-fat diet.  Filed Weights   09/19/19 0321  Weight: 90.6 kg    History of present illness:  33 y.o. 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  Patient reports that the abdominal pain is mainly in the upper abdomen and right upper quadrant, constant, associated with nausea and nonbloody vomiting, reportedly going on for months, but much worse for the past few days.  She reports being unable to eat or drink much of anything in the past few days due to the symptoms.  She has been able to tolerate some Pedialyte at home.  She was in the hospital with similar symptoms approximately 3 weeks ago, had extensive work-up that included negative MRCP, CT abdomen/pelvis with no acute findings, right upper quadrant ultrasound was unrevealing, and  she had EGD with small anastomotic ulcer.  She was referred to gastroenterology at [facility], but has not yet been seen there.  ED Course: Upon arrival to the ED, patient is found to be afebrile, saturating well on room air, and with blood pressure 96/59.  EKG features of sinus rhythm and chest x-ray is negative for acute cardiopulmonary disease.  Chemistry panel notable for alkaline phosphatase 137, AST 63, and ALT 90.  CBC is unremarkable.  CT of the abdomen and pelvis is negative for acute or inflammatory process.  Patient was given 2 L normal saline, Zofran, and fentanyl in the emergency department.  Attempts were made to admit the patient to [facility], but there was no bed available at that facility and it was recommended that the patient be admitted here for symptom control and then transfer when able.  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 -Given distortional anatomy secondary to history of gastric bypass no ERCP can be attempted in this facility.   -After discussion with gastroenterology service and providing stabilization of her symptoms patient discharged home with continue use of PRN analgesics and pain medications. -She will follow-up with her PCP and also with gastroenterology service as a scheduled. -Patient will require referral to pain clinic for chronic management.  2-transaminitis -Improved/resolved at time of discharge -Repeat complete metabolic panel to follow liver function test.  3-depression with anxiety -Continue Cymbalta and Klonopin as previously prescribed -No suicidal  ideation or hallucination.  4-migraine -Continue the use of  Topamax -PRN Tylenol safe to use.  5-gastroesophageal disease/peptic ulcer disease -Continue PPI -Continue to avoid the use of NSAIDs.  *The rest of her medical problems has remained stable and the plan is to continue prior to admission medication regimen with outpatient follow-up for further adjustment in her chronic management.  Procedures:  See below for x-ray reports.  Consultations:  Gastroenterology service.  Discharge Exam: Vitals:   09/20/19 1348 09/20/19 1418  BP: 108/70 100/63  Pulse: 62 73  Resp: 17 18  Temp: 98.3 F (36.8 C)   SpO2: 92% 92%    General: Patient denies any nausea vomiting.  Still with some intermittent abdominal discomfort but not as bad when she presented to the hospital.  Tolerating liquid diet. Cardiovascular: S1 and S2, no rubs, no gallops, no JVD. Respiratory: Clear to auscultation bilaterally; good oxygen saturation on room air. Abdomen: Soft, mildly tender diffusely on palpation right upper quadrant and midepigastric area.  No guarding.  Positive bowel sounds.  Discharge Instructions   Discharge Instructions    Discharge instructions   Complete by: As directed    Full liquid to soft diet (small amounts, multiple meals) Low-fat content diet. Maintain adequate hydration Take medications as prescribed Follow-up with PCP and gastroenterology service as instructed.     Allergies as of 09/20/2019      Reactions   Gabapentin Other (See Comments)   Pt states that she was unresponsive after taking this medication, but her vitals remained stable.     Metoclopramide Hcl Anxiety, Other (See Comments)   Pt states that she felt like she was trapped in a box, and could not get out.  Pt also states that she had temporary loss of movement, weakness, and tingling.     Tramadol Other (See Comments)   Seizures   Ativan [lorazepam] Other (See Comments)   combative   Latex Itching, Rash, Other (See Comments)   Burns   Lyrica [pregabalin] Other  (See Comments)   suicidal   Trazodone And Nefazodone Other (See Comments)   Makes pt "like a zombie"   Zofran [ondansetron] Other (See Comments)   Migraines in high doses    Sulfa Antibiotics Hives   Butrans [buprenorphine] Rash   Patch caused rash, didn't help with pain   Nucynta [tapentadol] Nausea And Vomiting   Propofol Nausea And Vomiting   Tape Itching, Rash, Other (See Comments)   Please use paper tape   Toradol [ketorolac Tromethamine] Anxiety      Medication List    STOP taking these medications   predniSONE 20 MG tablet Commonly known as: DELTASONE   promethazine 25 MG tablet Commonly known as: PHENERGAN   Pyridium 100 MG tablet Generic drug: phenazopyridine     TAKE these medications   albuterol 108 (90 Base) MCG/ACT inhaler Commonly known as: VENTOLIN HFA Inhale 2 puffs into the lungs every 6 (six) hours as needed for wheezing or shortness of breath. What changed: Another medication with the same name was removed. Continue taking this medication, and follow the directions you see here.   albuterol (2.5 MG/3ML) 0.083% nebulizer solution Commonly known as: PROVENTIL Take 3 mLs (2.5 mg total) by nebulization every 4 (four) hours as needed for wheezing. What changed: Another medication with the same name was removed. Continue taking this medication, and follow the directions you see here.   amphetamine-dextroamphetamine 30 MG 24 hr capsule Commonly known as: Adderall XR TAKE (1) CAPSULE BY MOUTH EVERY  DAY. What changed:   how much to take  how to take this  when to take this  additional instructions   clonazePAM 0.5 MG tablet Commonly known as: KlonoPIN Take 1 tablet (0.5 mg total) by mouth 3 (three) times daily as needed for anxiety.   DULoxetine 60 MG capsule Commonly known as: CYMBALTA Take 1 capsule (60 mg total) by mouth 2 (two) times daily.   ondansetron 4 MG disintegrating tablet Commonly known as: ZOFRAN-ODT Take 1 tablet (4 mg total) by  mouth every 8 (eight) hours as needed for nausea or vomiting. What changed: Another medication with the same name was removed. Continue taking this medication, and follow the directions you see here.   oxyCODONE 5 MG immediate release tablet Commonly known as: Oxy IR/ROXICODONE Take 1 tablet (5 mg total) by mouth every 6 (six) hours as needed for up to 5 days for severe pain. What changed: when to take this   pantoprazole 40 MG tablet Commonly known as: PROTONIX Take 1 tablet (40 mg total) by mouth 2 (two) times daily before a meal.   topiramate 50 MG tablet Commonly known as: TOPAMAX Take 2 tablets (100 mg total) by mouth 2 (two) times daily. What changed: See the new instructions.   valACYclovir 1000 MG tablet Commonly known as: VALTREX Take 1 bid for 10 days then Take  1 daily for suppression What changed:   how much to take  how to take this  when to take this  additional instructions   zolpidem 10 MG tablet Commonly known as: Ambien Take 1 tablet (10 mg total) by mouth at bedtime.      Allergies  Allergen Reactions  . Gabapentin Other (See Comments)    Pt states that she was unresponsive after taking this medication, but her vitals remained stable.    . Metoclopramide Hcl Anxiety and Other (See Comments)    Pt states that she felt like she was trapped in a box, and could not get out.  Pt also states that she had temporary loss of movement, weakness, and tingling.    . Tramadol Other (See Comments)    Seizures  . Ativan [Lorazepam] Other (See Comments)    combative  . Latex Itching, Rash and Other (See Comments)    Burns  . Lyrica [Pregabalin] Other (See Comments)    suicidal  . Trazodone And Nefazodone Other (See Comments)    Makes pt "like a zombie"  . Zofran [Ondansetron] Other (See Comments)    Migraines in high doses    . Sulfa Antibiotics Hives  . Butrans [Buprenorphine] Rash    Patch caused rash, didn't help with pain  . Nucynta [Tapentadol] Nausea  And Vomiting  . Propofol Nausea And Vomiting  . Tape Itching, Rash and Other (See Comments)    Please use paper tape  . Toradol [Ketorolac Tromethamine] Anxiety   Follow-up Information    Kathyrn Drown, MD. Go on 09/24/2019.   Specialty: Family Medicine Contact information: [address] [place] [address] [phone]           The results of significant diagnostics from this hospitalization (including imaging, microbiology, ancillary and laboratory) are listed below for reference.    Significant Diagnostic Studies: Ct Abdomen Pelvis Wo Contrast  Result Date: 09/19/2019 CLINICAL DATA:  33 year old female with abdominal pain nausea and vomiting. EXAM: CT ABDOMEN AND PELVIS WITHOUT CONTRAST TECHNIQUE: Multidetector CT imaging of the abdomen and pelvis was performed following the standard protocol without  IV contrast. COMPARISON:  CT Abdomen and Pelvis 08/25/2019. FINDINGS: Lower chest: Questionable pulmonary hyperinflation but otherwise negative. Hepatobiliary: Surgically absent gallbladder. Negative noncontrast liver. Pancreas: Negative. Spleen: Negative. Adrenals/Urinary Tract: Normal adrenal glands. Negative noncontrast kidneys. No nephrolithiasis. No hydronephrosis or perinephric stranding. Decompressed proximal ureters. Unremarkable urinary bladder. Stomach/Bowel: Redundant sigmoid colon which extends above the umbilicus. Retained stool from the right colon to the proximal sigmoid. Redundant splenic flexure. No large bowel inflammation. Normal appendix on coronal image 39. Negative distal small bowel. Fluid-filled but nondilated small bowel. Previous gastric bypass appear stable. No free air, free fluid. Vascular/Lymphatic: Vascular patency is not evaluated in the absence of IV contrast. No calcified atherosclerosis. No lymphadenopathy. Reproductive: Stable, negative. Other: Trace if any pelvic free fluid. Musculoskeletal: T12 vertebral augmentation again noted. No acute  osseous abnormality identified. IMPRESSION: 1. No urinary calculus or obstructive uropathy. 2. No acute or inflammatory process identified in the non-contrast abdomen or pelvis. Normal appendix. Prior gastric bypass. Electronically Signed   By: Genevie Ann M.D.   On: 09/19/2019 01:53   Dg Chest 2 View  Result Date: 09/19/2019 CLINICAL DATA:  chest pain EXAM: CHEST - 2 VIEW COMPARISON:  08/30/2019 FINDINGS: The heart size and mediastinal contours are within normal limits. Both lungs are clear. Post augmentation changes of the lower spine. IMPRESSION: No active cardiopulmonary disease. Electronically Signed   By: Donavan Foil M.D.   On: 09/19/2019 01:17   Dg Chest 2 View  Result Date: 08/25/2019 CLINICAL DATA:  Shortness of breath EXAM: CHEST - 2 VIEW COMPARISON:  08/23/2019 FINDINGS: The heart size and mediastinal contours are within normal limits. Both lungs are clear. The visualized skeletal structures are unremarkable. IMPRESSION: No acute abnormality of the lungs. Electronically Signed   By: Eddie Candle M.D.   On: 08/25/2019 18:47   US Abdomen Complete  Result Date: 08/27/2019 CLINICAL DATA:  Abdominal pain and nausea for 2 weeks. EXAM: ABDOMEN ULTRASOUND COMPLETE COMPARISON:  CT abdomen and pelvis 08/25/2019. FINDINGS: Gallbladder: Removed. Common bile duct: Diameter: 1.1 cm proximally, unchanged. Liver: No focal lesion identified. Within normal limits in parenchymal echogenicity. Portal vein is patent on color Doppler imaging with normal direction of blood flow towards the liver. IVC: No abnormality visualized. Pancreas: Not visualized. Spleen: Size and appearance within normal limits. Right Kidney: Length: Approximately 10.4 cm. Echogenicity within normal limits. No mass or hydronephrosis visualized. Left Kidney: Length: Not visualized. Abdominal aorta: No aneurysm visualized. Other findings: None. IMPRESSION: No acute abnormality or finding to explain the patient's symptoms. The pancreas and left  kidney are not seen due to overlying bowel gas. Status post cholecystectomy Electronically Signed   By: Inge Rise M.D.   On: 08/27/2019 16:18   Ct Abdomen Pelvis W Contrast  Result Date: 08/25/2019 CLINICAL DATA:  Abdominal pain. Hernia. EXAM: CT ABDOMEN AND PELVIS WITH CONTRAST TECHNIQUE: Multidetector CT imaging of the abdomen and pelvis was performed using the standard protocol following bolus administration of intravenous contrast. CONTRAST:  134mL OMNIPAQUE IOHEXOL 300 MG/ML  SOLN COMPARISON:  December 18, 2017. FINDINGS: Lower chest: The lung bases are clear. The heart size is normal. Hepatobiliary: The liver is normal. Status post cholecystectomy.There is no biliary ductal dilation. Pancreas: Normal contours without ductal dilatation. No peripancreatic fluid collection. Spleen: No splenic laceration or hematoma. Adrenals/Urinary Tract: --Adrenal glands: No adrenal hemorrhage. --Right kidney/ureter: No hydronephrosis or perinephric hematoma. --Left kidney/ureter: No hydronephrosis or perinephric hematoma. --Urinary bladder: Unremarkable. Stomach/Bowel: --Stomach/Duodenum: The patient is status post prior gastric bypass. --Small bowel:  No dilatation or inflammation. --Colon: No focal abnormality. --Appendix: Normal. Vascular/Lymphatic: Normal course and caliber of the major abdominal vessels. --No retroperitoneal lymphadenopathy. --there are mildly prominent mesenteric lymph nodes. --No pelvic or inguinal lymphadenopathy. Reproductive: Unremarkable Other: No ascites or free air. The abdominal wall is normal. Musculoskeletal. The patient is status post prior vertebral augmentation of the T12 vertebral body. IMPRESSION: No acute abdominopelvic abnormality. Electronically Signed   By: Constance Holster M.D.   On: 08/25/2019 20:57   Mr Abdomen Mrcp Wo Contrast  Result Date: 08/28/2019 CLINICAL DATA:  Abdominal pain with elevated liver function studies. History of cholecystectomy in 2005 and previous  gastric bypass. EXAM: MRI ABDOMEN WITHOUT CONTRAST  (INCLUDING MRCP) TECHNIQUE: Multiplanar multisequence MR imaging of the abdomen was performed. Heavily T2-weighted images of the biliary and pancreatic ducts were obtained, and three-dimensional MRCP images were rendered by post processing. COMPARISON:  Abdominopelvic CT 08/25/2019 and 12/18/2017. Abdominal ultrasound 08/27/2019. FINDINGS: Despite efforts by the technologist and patient, mild motion artifact is present on today's exam and could not be eliminated. This reduces exam sensitivity and specificity. Lower chest:  The visualized lower chest appears unremarkable. Hepatobiliary: The liver appears normal in signal without focal lesion or significant steatosis. Status post cholecystectomy. There is mild intra and extrahepatic biliary dilatation, similar to previous studies. The common hepatic duct measures 11 mm in diameter. There is a mildly dilated cystic duct remnant. No evidence of choledocholithiasis. Pancreas: Unremarkable. No pancreatic ductal dilatation or surrounding inflammatory changes. Spleen: Normal in size without focal abnormality. Adrenals/Urinary Tract: Both adrenal glands appear normal. The kidneys and ureters appear normal. Stomach/Bowel: No evidence of bowel wall thickening, distention or surrounding inflammatory change.Stable postoperative changes in the left upper quadrant consistent with previous gastric bypass. Vascular/Lymphatic: There are no enlarged abdominal lymph nodes. No significant vascular findings are present. Other: The visualized abdominal wall is intact. No ascites. Mild dependent subcutaneous edema in the back. Musculoskeletal: Status post T12 spinal augmentation. No acute osseous findings. IMPRESSION: 1. Stable mild biliary dilatation post cholecystectomy, without evidence of choledocholithiasis, likely physiologic. 2. No acute or focal hepatic abnormalities identified. 3. Previous gastric bypass. Electronically Signed    By: Richardean Sale M.D.   On: 08/28/2019 14:17   Mr 3d Recon At Scanner  Result Date: 08/28/2019 CLINICAL DATA:  Abdominal pain with elevated liver function studies. History of cholecystectomy in 2005 and previous gastric bypass. EXAM: MRI ABDOMEN WITHOUT CONTRAST  (INCLUDING MRCP) TECHNIQUE: Multiplanar multisequence MR imaging of the abdomen was performed. Heavily T2-weighted images of the biliary and pancreatic ducts were obtained, and three-dimensional MRCP images were rendered by post processing. COMPARISON:  Abdominopelvic CT 08/25/2019 and 12/18/2017. Abdominal ultrasound 08/27/2019. FINDINGS: Despite efforts by the technologist and patient, mild motion artifact is present on today's exam and could not be eliminated. This reduces exam sensitivity and specificity. Lower chest:  The visualized lower chest appears unremarkable. Hepatobiliary: The liver appears normal in signal without focal lesion or significant steatosis. Status post cholecystectomy. There is mild intra and extrahepatic biliary dilatation, similar to previous studies. The common hepatic duct measures 11 mm in diameter. There is a mildly dilated cystic duct remnant. No evidence of choledocholithiasis. Pancreas: Unremarkable. No pancreatic ductal dilatation or surrounding inflammatory changes. Spleen: Normal in size without focal abnormality. Adrenals/Urinary Tract: Both adrenal glands appear normal. The kidneys and ureters appear normal. Stomach/Bowel: No evidence of bowel wall thickening, distention or surrounding inflammatory change.Stable postoperative changes in the left upper quadrant consistent with previous gastric bypass. Vascular/Lymphatic: There are  no enlarged abdominal lymph nodes. No significant vascular findings are present. Other: The visualized abdominal wall is intact. No ascites. Mild dependent subcutaneous edema in the back. Musculoskeletal: Status post T12 spinal augmentation. No acute osseous findings. IMPRESSION: 1.  Stable mild biliary dilatation post cholecystectomy, without evidence of choledocholithiasis, likely physiologic. 2. No acute or focal hepatic abnormalities identified. 3. Previous gastric bypass. Electronically Signed   By: Richardean Sale M.D.   On: 08/28/2019 14:17   Dg Chest Port 1 View  Result Date: 08/30/2019 CLINICAL DATA:  33 year old female with a history shortness of breath EXAM: PORTABLE CHEST 1 VIEW COMPARISON:  August 25, 2019 FINDINGS: Cardiomediastinal silhouette within normal limits in size and contour. No evidence of central vascular congestion. Interval placement right upper extremity PICC with the tip appearing to terminate superior vena cava. No confluent airspace disease pneumothorax or pleural effusion. IMPRESSION: Negative for acute cardiopulmonary disease. Right upper extremity PICC Electronically Signed   By: Corrie Mckusick D.O.   On: 08/30/2019 14:42   Dg Chest Portable 1 View  Result Date: 08/23/2019 CLINICAL DATA:  Shortness of breath EXAM: PORTABLE CHEST 1 VIEW COMPARISON:  08/16/2019 FINDINGS: The heart size and mediastinal contours are within normal limits. Both lungs are clear. The visualized skeletal structures are unremarkable. Mild patient rotation to the right accentuates the mediastinal markings. IMPRESSION: No active disease. Electronically Signed   By: Inez Catalina M.D.   On: 08/23/2019 20:58   Dg Abd Acute W/chest  Result Date: 09/10/2019 CLINICAL DATA:  Shortness of breath EXAM: DG ABDOMEN ACUTE W/ 1V CHEST COMPARISON:  None. FINDINGS: There is no evidence of dilated bowel loops or free intraperitoneal air. No radiopaque calculi or other significant radiographic abnormality is seen. Heart size and mediastinal contours are within normal limits. Both lungs are clear. Is cement fixation seen at the T12 vertebral body. Surgical clips seen the right and left mid abdomen. IMPRESSION: Nonobstructive bowel gas pattern.  No acute cardiopulmonary disease. Electronically  Signed   By: Prudencio Pair M.D.   On: 09/10/2019 19:46   [name] Ekg Site Rite  Result Date: 08/29/2019 If Site Rite image not attached, placement could not be confirmed due to current cardiac rhythm.   Microbiology: Recent Results (from the past 240 hour(s))  SARS CORONAVIRUS 2 (TAT 6-24 HRS) Nasopharyngeal Nasopharyngeal Swab     Status: None   Collection Time: 09/19/19  1:15 AM   Specimen: Nasopharyngeal Swab  Result Value Ref Range Status   SARS Coronavirus 2 NEGATIVE NEGATIVE Final    Comment: (NOTE) SARS-CoV-2 target nucleic acids are NOT DETECTED. The SARS-CoV-2 RNA is generally detectable in upper and lower respiratory specimens during the acute phase of infection. Negative results do not preclude SARS-CoV-2 infection, do not rule out co-infections with other pathogens, and should not be used as the sole basis for treatment or other patient management decisions. Negative results must be combined with clinical observations, patient history, and epidemiological information. The expected result is Negative. Fact Sheet for Patients: SugarRoll.be Fact Sheet for Healthcare Providers: https://www.woods-mathews.com/ This test is not yet approved or cleared by the [place] FDA and  has been authorized for detection and/or diagnosis of SARS-CoV-2 by FDA under an Emergency Use Authorization (EUA). This EUA will remain  in effect (meaning this test can be used) for the duration of the COVID-19 declaration under Section 56 4(b)(1) of the Act, 21 U.S.C. section 360bbb-3(b)(1), unless the authorization is terminated or revoked sooner. Performed at [facility], [place]  [address], [place], [place] [address]      Labs: Basic Metabolic Panel: Recent Labs  Lab 09/18/19 2138 09/19/19 0628 09/20/19 0454  NA 136 141 139  K 3.9 5.2* 3.7  CL 104 113* 109  CO2 22 18* 22  GLUCOSE 85 85 122*  BUN 9 7 8   CREATININE 0.70 0.73 0.65  CALCIUM  9.5 8.8* 8.6*  MG  --  2.2  --    Liver Function Tests: Recent Labs  Lab 09/18/19 2138 09/19/19 0628 09/20/19 0454  AST 63* 54* 34  ALT 90* 64* 50*  ALKPHOS 137* 116 110  BILITOT 0.5 0.8 0.5  PROT 8.0 6.6 6.0*  ALBUMIN 4.5 3.7 3.5   Recent Labs  Lab 09/18/19 2138 09/19/19 0628  LIPASE 27 24   CBC: Recent Labs  Lab 09/18/19 2138 09/19/19 0628  WBC 7.0 6.6  HGB 13.5 12.9  HCT 40.6 37.1  MCV 98.1 94.6  PLT 323 257    BNP (last 3 results) Recent Labs    08/17/19 0008  BNP 6.0    Signed:  Barton Dubois MD.  [facility] 09/20/2019, 3:46 PM

## 2019-09-20 NOTE — Progress Notes (Signed)
Patient called for PRN breathing treatment but treatments had been discontinued. Patient states she takes albuterol Q4 PRN at home but states they make her shake. RT ordered Xopenex 0.63 Q4 PRN for patient.

## 2019-09-20 NOTE — Telephone Encounter (Signed)
Unfortunately it is a difficult situation for the family I am certainly sympathetic to what is going on I did have a good discussion with Dr.rEHMAN last week.  He states that he had done everything he could to try to get her in sooner him [facility] did not have the ability to see her sooner  If her situation gets bad enough again to where she has to go into the hospital I would highly recommend the family drive her directly to [facility] ER for further evaluation and admission if that situation arises. We can do a virtual visit with the patient on Monday Unfortunately today I am already booked up and I am out tomorrow  Nurses please communicate with the mother but also go ahead with communicating with the patient this information in seeing if she is open to doing a virtual visit on Monday and if so go ahead and schedule If she would rather do an in person visit on Monday that would be fine as well

## 2019-09-20 NOTE — Telephone Encounter (Signed)
Per provider

## 2019-09-20 NOTE — Telephone Encounter (Signed)
Discussed with pt's mother and while I was talking to mother pt called in and I discussed with her. She had appt scheduled for Monday but started crying on the phone while I was talking to her because she did not understand why she had to wait til Monday to get pain med. I told her dr Nicki Reaper had to speak with her first. She then said cancel my appointment for Monday because she was done. I told her to call back if she changed her mind and she said she  Would not be changing her mind she is done.

## 2019-09-20 NOTE — Progress Notes (Signed)
Patient is well-known to me from multiple evaluation in the past including one last month when she was admitted with abdominal and chest pain and elevated transaminases.  She also has a history of peptic ulcer disease/anastomotic ulcer secondary to NSAID use. During her hospitalization in September 2020 she underwent CT, ultrasound and MRCP.  Choledocholithiasis could not be confirmed. 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  I contacted Dr. Gayleen Orem of [facility] biliary service and requested consultation for ERCP. patient has not heard from his office yet. If patient's upper GI tract had not been rerouted(bariatric surgery) I would have proceeded with ERCP at this facility.  With altered anatomy ERCP may be very difficult if not impossible and for that matter it needs to be attended at a tertiary center.  If this is not possible she may need transhepatic cholangiography. Patient has been back in emergency room 10 days ago and again 2 days ago.  Following that visit she was hospitalized.  Unenhanced CT does not reveal any hepatobiliary abnormality. Patient says Dilaudid is helping with pain.  She is not having any nausea or vomiting.  Recommendations  I would recommend judicious use of pain medication until she can be seen at [facility].  I have talked with [name] who has reservations about narcotic use in light of patient's not following recommendations.  She has been discharged from Dr. Freddie Apley office as well as another pain clinic.  In spite of that he is willing to help on a short-term basis.  Use of narcotic in the situation is appropriate as she cannot take NSAIDs since she has peptic ulcer disease. I also talk with patient's mother Santiago Glad 2 days ago and recommended that they can directly take the patient to [facility] ER.  Discussed these recommendations with Dr. Barton Dubois.

## 2019-09-20 NOTE — Telephone Encounter (Signed)
Referral placed.

## 2019-09-20 NOTE — Telephone Encounter (Signed)
I had a good conversation with the patient Certainly sympathetic to what is going on with her she does have a painful condition We did talk about the importance of being careful with pain medicine not to overuse it She agrees to try to stick to 4/day 20 pills were sent into [facility] She will do a virtual follow-up on Monday  Nurses-please put in referral for pain management with [facility]

## 2019-09-20 NOTE — Telephone Encounter (Signed)
Social worker from [facility] called & we scheduled pt for a virtual visit on 09/24/2019 @ 1:10 (pt was given choice of virtual of in office)  When social worker gave pt info, pt immediately asked if Dr. Nicki Reaper had spoken with Dr. Laural Golden about her pain medicine  Explained that yes they had discussed & Dr. Bary Leriche note states he would discuss this with her at her visit on Monday - pt immediately starts crying and is now calling the office

## 2019-09-20 NOTE — TOC Initial Note (Addendum)
Transition of Care [facility]) - Initial/Assessment Note    Patient Details  Name: Penny [name] MRN: [id number] Date of Birth: 1986/09/18  Transition of Care [facility]) CM/SW Contact:    Ihor Gully, LCSW Phone Number: 09/20/2019, 2:33 PM  Clinical Narrative:                 Patient admitted for abdominal pain. She describes being aggravated and frustrated due to not being able to be transferred to [facility].  She stated that she has been getting dilaudid in the hospital and the attending is trying to discharge her with Tylenol.  Patient states that a Band-Aid is being put on her problems and things are not getting done. Patient states that she sees her PCP regularly. She states that her PCP has indicated that she needs to go to a pain management clinic, however she has not yet been referred to such clinic. Patient states that she feels that people are tire of dealing with her because she is a "frequent flyer." Patient has four children who sees their mother "in pain all the time." Patient indicated that she has an appointment with Dr. Harrington Challenger at [facility] on 09/25/2019 to address behavioral health diagnosis. Patient lives with her mom, step dad and children. Patient does not drive because she states that she has been "passing out and getting dizzy." Her parents transport her to appointments. Patient reports that her abdominal pain issues started about a month ago.  Patient's frustration with with chronic pain was validated. Patient praised for seeking behavioral health to assist with the emotional impact of being unwell. Hospital discharge appointment scheduled for 09/15/2019 @ 1:00 p.m. (virtual). Patient made aware of appointment and to follow up with PCP regarding pain medications.  TOC following and will address needs as they arise.   TOC signing off.     Expected Discharge Plan: Home/Self Care Barriers to Discharge: Continued Medical Work up   Patient Goals  and CMS Choice Patient states their goals for this hospitalization and ongoing recovery are:: To feel better and get something for her pain.      Expected Discharge Plan and Services Expected Discharge Plan: Home/Self Care       Living arrangements for the past 2 months: Single Family Home Expected Discharge Date: 09/20/19                                    Prior Living Arrangements/Services Living arrangements for the past 2 months: Single Family Home Lives with:: Parents, Minor Children Patient language and need for interpreter reviewed:: Yes Do you feel safe going back to the place where you live?: Yes      Need for Family Participation in Patient Care: Yes (Comment) Care giver support system in place?: Yes (comment)   Criminal Activity/Legal Involvement Pertinent to Current Situation/Hospitalization: No - Comment as needed  Activities of Daily Living Home Assistive Devices/Equipment: Nebulizer ADL Screening (condition at time of admission) Patient's cognitive ability adequate to safely complete daily activities?: Yes Is the patient deaf or have difficulty hearing?: No Does the patient have difficulty seeing, even when wearing glasses/contacts?: No Does the patient have difficulty concentrating, remembering, or making decisions?: No Patient able to express need for assistance with ADLs?: Yes Does the patient have difficulty dressing or bathing?: No Independently performs ADLs?: Yes (appropriate for developmental age) Does the patient have difficulty walking or climbing stairs?:  No Weakness of Legs: None Weakness of Arms/Hands: None  Permission Sought/Granted Permission sought to share information with : PCP                Emotional Assessment Appearance:: Appears stated age Attitude/Demeanor/Rapport: Angry Affect (typically observed): Frustrated Orientation: : Oriented to Self, Oriented to Place, Oriented to  Time, Oriented to Situation Alcohol /  Substance Use: Not Applicable Psych Involvement: No (comment)  Admission diagnosis:  Recurrent abdominal pain [R10.9] Patient Active Problem List   Diagnosis Date Noted  . Intractable abdominal pain 09/19/2019  . Intractable nausea and vomiting 09/19/2019  . Gastroesophageal reflux disease without esophagitis   . Cannabis abuse 08/30/2019  . Dyspnea   . Severe anxiety 08/21/2019  . Hematemesis with nausea 04/23/2019  . Absolute anemia 04/23/2019  . Urge incontinence 10/26/2018  . S/P kyphoplasty 08/02/2018  . Herpes genitalis in women 12/19/2017  . Abnormal urine odor 03/30/2017  . Urinary tract infection with hematuria 03/30/2017  . Screening for genitourinary condition 03/30/2017  . Pain 03/30/2017  . Burning with urination 03/30/2017  . Family history of breast cancer in mother,uncertain BR CA status 02/28/2017  . Fibroid 01/18/2017  . Chronic pain syndrome 12/05/2016  . Folate deficiency 12/04/2016  . Nausea vomiting and diarrhea 12/04/2016  . Severe anemia 12/04/2016  . Symptomatic anemia 12/03/2016  . Complaints of leg weakness   . Paresthesia   . Left-sided weakness   . Uncontrolled pain 01/24/2016  . Malnutrition of moderate degree 01/24/2016  . Neuropathy 01/23/2016  . Inability to walk 01/23/2016  . Paresthesias   . Bilateral leg numbness 11/26/2015  . 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 Post partum depression 05/09/2015  . Anastomotic ulcer   . Dysphagia   . Hematemesis 04/07/2015  . Intractable vomiting with nausea 04/07/2015  . Elevated liver enzymes   . Epigastric pain   . 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 Seizure-like activity ([place]) 02/22/2015  . Fibromyalgia 02/18/2015  . Vulvar fissure 02/18/2015  . Clinical depression 02/01/2015  . Current smoker 01/29/2015  . Current tobacco use 01/29/2015  . 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 Leg weakness, bilateral  01/02/2015  . Gait disorder 01/02/2015  . Other symptoms and signs involving the musculoskeletal system 01/02/2015  . Abnormal gait 01/02/2015  . Cervicalgia 12/18/2014  . Sciatica of left side 11/14/2014  . Neuralgia neuritis, sciatic nerve 11/14/2014  . 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 Encounter for sterilization 09/09/2014  . Abdominal pain, acute, right lower quadrant 08/22/2014  . Headache, migraine 08/19/2014  . Seizure ([place]) 08/18/2014  . Encephalopathy 08/13/2014  . Headache 08/13/2014  . Altered mental status 08/13/2014  . Right leg numbness 07/31/2014  . Disturbance of skin sensation 07/31/2014  . Hypertension in pregnancy, transient 07/08/2014  . Previous gastric bypass affecting pregnancy, antepartum 05/22/2014  . 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 ASD (atrial septal defect), ostium secundum 05/17/2014  . Depression complicating pregnancy in second trimester, antepartum 05/09/2014  . Antepartum mental disorder in pregnancy 05/09/2014  . Rapid palpitations 02/18/2014  . H/O maternal third degree perineal laceration, currently pregnant 02/18/2014  . High-risk pregnancy 02/18/2014  . Supervision of pregnancy with other poor reproductive or obstetric history, unspecified trimester 02/18/2014  . Restless legs 01/16/2014  . Restless leg 01/16/2014  . Chronic interstitial cystitis 10/23/2013  . Menorrhagia 07/18/2013  . Excessive and frequent menstruation 07/18/2013  . h/o Opiate addiction 03/11/2012    Class:  Acute  . History of migraine headaches 03/10/2012    Class: Acute  . Depression with anxiety 03/10/2012    Class: Chronic  . 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 Panic disorder without agoraphobia 03/10/2012  . H/O disease 03/10/2012  . Dysthymia 03/10/2012  . Pelvic congestion syndrome 10/13/2011   . Coitalgia 10/13/2011  . Chronic migraine without aura 10/13/2011  . Unspecified dyspareunia 10/13/2011  . IBS (irritable bowel syndrome) 08/25/2011  . Diarrhea 05/27/2011  . OBESITY, UNSPECIFIED 09/17/2010  . Transaminitis 09/17/2010  . IDA (iron deficiency anemia) 07/23/2010   PCP:  Kathyrn Drown, MD Pharmacy:   [place], [place] [address] Phone: [phone] Fax: [phone]  [id number] [place], [address] [place] [address] Phone: [phone] Fax: [phone]  [id number] [place], [id number] [place] [address] Phone: [phone] Fax: [phone]     Social Determinants of Health ([place]) Interventions    Readmission Risk Interventions Readmission Risk Prevention Plan 09/20/2019 08/31/2019  Transportation Screening Complete Complete  PCP or Specialist Appt within 3-5 Days - Not Complete  HRI or Home Care Consult Complete Complete  Social Work Consult for [place] Planning/Counseling Complete Complete  Palliative Care Screening Complete Not Complete  Medication Review Press photographer) Complete Complete  Some recent data might be hidden

## 2019-09-20 NOTE — Telephone Encounter (Signed)
Pt's mom called - pt is being discharged from [facility] today - [facility] was trying to get pt transferred to [facility] but there is no bed available.   Mom states pt is very tearful, scared, & worried - tired of being sick & hurting. In & out of the hospital over the past few weeks  Mom states that pt is scheduled to see a specialist at [facility] mid November, she was referred there by Dr. Laural Golden for some kind of procedure that can only be done there due to pt's history of gastric bypass & having some kind of abdominal pain & possible stones  Mom states she don't think pt will hurt herself but mentally she's not in a good place.  (pt has appt with behavioral health 09/25/2019)  Mom pleading for some kind of help, explained that Dr. Nicki Reaper doesn't have any appointments until next week, mom asks if he can just call mom or pt, explained that even a phone visit requires appointment time to be filled  Please advise  Pt# [phone],  [place]) [phone]

## 2019-09-24 ENCOUNTER — Ambulatory Visit (INDEPENDENT_AMBULATORY_CARE_PROVIDER_SITE_OTHER): Payer: Medicaid Other | Admitting: Family Medicine

## 2019-09-24 DIAGNOSIS — K834 Spasm of sphincter of Oddi: Secondary | ICD-10-CM | POA: Diagnosis not present

## 2019-09-24 DIAGNOSIS — R1013 Epigastric pain: Secondary | ICD-10-CM

## 2019-09-24 DIAGNOSIS — R1084 Generalized abdominal pain: Secondary | ICD-10-CM | POA: Diagnosis not present

## 2019-09-24 DIAGNOSIS — R0789 Other chest pain: Secondary | ICD-10-CM | POA: Diagnosis not present

## 2019-09-24 DIAGNOSIS — I499 Cardiac arrhythmia, unspecified: Secondary | ICD-10-CM | POA: Diagnosis not present

## 2019-09-24 DIAGNOSIS — Z20828 Contact with and (suspected) exposure to other viral communicable diseases: Secondary | ICD-10-CM | POA: Diagnosis not present

## 2019-09-24 DIAGNOSIS — R0602 Shortness of breath: Secondary | ICD-10-CM | POA: Diagnosis not present

## 2019-09-24 DIAGNOSIS — R079 Chest pain, unspecified: Secondary | ICD-10-CM | POA: Diagnosis not present

## 2019-09-24 MED ORDER — OXYCODONE HCL 5 MG PO TABS: ORAL_TABLET | ORAL | 0 refills | Status: DC

## 2019-09-24 NOTE — Progress Notes (Signed)
Subjective:    Patient ID: Penny [name], female    DOB: 1986/02/06, 33 y.o.   MRN: [id number]  HPI Pt was in [facility] for 2 days for abdominal pain. Pt states she is doing ok. Pt would like to discuss pain medication with provider. Provider did speak with patient briefly last week.  Patient has ongoing abdominal pain and discomfort.  She has had previous surgeries.  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  Apparently a fairly painful condition.  Unable to do ERCP because of her gastric bypass Local GI trying to set her up at [facility] but unfortunately they are backlog When she went to the hospital recently they tried to transfer her to [facility] but [facility] stated they did not have any transfer beds available This patient does have a history of misuse of pain medicines but she states she is stopped with no more than 4/day states it does not cause any drowsiness or dizziness We did discuss that long-term she would need to be under the management of a pain control specialist but for now we will try to help her out I will go ahead and do 1 weeks worth of medicine see below Virtual Visit via Video Note  I connected with Penny [name] on 09/24/19 at  1:10 PM EDT by a video enabled telemedicine application and verified that I am speaking with the correct person using two identifiers.  Location: Patient: home Provider: office   I discussed the limitations of evaluation and management by telemedicine and the availability of in person appointments. The patient expressed understanding and agreed to proceed.  History of Present Illness:    Observations/Objective:   Assessment and Plan:   Follow Up Instructions:    I discussed the assessment and treatment plan with the patient. The patient was provided an opportunity to ask questions and all were answered. The patient agreed with the plan and demonstrated  an understanding of the instructions.   The patient was advised to call back or seek an in-person evaluation if the symptoms worsen or if the condition fails to improve as anticipated.  I provided 15 minutes of non-face-to-face time during this encounter.   Mar[place]e [place], LPN    Review of Systems  Constitutional: Positive for fatigue. Negative for activity change and fever.  HENT: Negative for congestion and rhinorrhea.   Respiratory: Negative for cough, chest tightness and shortness of breath.   Cardiovascular: Negative for chest pain and leg swelling.  Gastrointestinal: Negative for abdominal pain and nausea.  Skin: Negative for color change.  Neurological: Positive for light-headedness. Negative for dizziness and headaches.  Psychiatric/Behavioral: Negative for agitation and behavioral problems.       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  She is waiting on seeing a specialist at [facility] It is been very difficult for the patient to get an appointment Local GI is trying to help her  Because of all this patient is not taking in much in the way of oral intake in terms of food and she is drinking primarily liquids.  I told the patient if she continues like this over the next couple days she should have someone drive her to [facility] for further evaluation in the ER along with hopeful speeding up consultation regarding gastroenterology (Patient will need a ERCP but because of her gastric bypass it is not possible to do locally, Dr.Rehman spoke with GI specialist at [facility] but apparently their next appointment is in November)  I did inform the patient that should do her situation get worse at the very least go to the local ER  Patient should hold Ambien while taking pain meds

## 2019-09-25 ENCOUNTER — Other Ambulatory Visit: Payer: Self-pay

## 2019-09-25 ENCOUNTER — Ambulatory Visit (HOSPITAL_COMMUNITY): Payer: Medicaid Other | Admitting: Psychiatry

## 2019-09-25 DIAGNOSIS — R109 Unspecified abdominal pain: Secondary | ICD-10-CM | POA: Diagnosis not present

## 2019-09-25 DIAGNOSIS — R7401 Elevation of levels of liver transaminase levels: Secondary | ICD-10-CM | POA: Diagnosis not present

## 2019-09-25 MED ORDER — SODIUM CHLORIDE 0.9 % IV SOLN
100.00 | INTRAVENOUS | Status: DC
Start: ? — End: 2019-09-25

## 2019-09-25 MED ORDER — CLONAZEPAM 0.5 MG PO TABS
0.50 | ORAL_TABLET | ORAL | Status: DC
Start: ? — End: 2019-09-25

## 2019-09-25 MED ORDER — PROMETHAZINE HCL 25 MG PO TABS
25.00 | ORAL_TABLET | ORAL | Status: DC
Start: ? — End: 2019-09-25

## 2019-09-25 MED ORDER — TOPIRAMATE 25 MG PO TABS
50.00 | ORAL_TABLET | ORAL | Status: DC
Start: 2019-10-05 — End: 2019-09-25

## 2019-09-25 MED ORDER — OXYCODONE HCL 5 MG PO TABS
5.00 | ORAL_TABLET | ORAL | Status: DC
Start: ? — End: 2019-09-25

## 2019-09-25 MED ORDER — DULOXETINE HCL 60 MG PO CPEP
60.00 | ORAL_CAPSULE | ORAL | Status: DC
Start: 2019-10-04 — End: 2019-09-25

## 2019-09-25 MED ORDER — ZOLPIDEM TARTRATE 5 MG PO TABS
10.00 | ORAL_TABLET | ORAL | Status: DC
Start: ? — End: 2019-09-25

## 2019-09-25 MED ORDER — LEVALBUTEROL HCL 0.31 MG/3ML IN NEBU
0.31 | INHALATION_SOLUTION | RESPIRATORY_TRACT | Status: DC
Start: ? — End: 2019-09-25

## 2019-09-25 MED ORDER — POLYETHYLENE GLYCOL 3350 17 G PO PACK
17.00 | PACK | ORAL | Status: DC
Start: 2019-09-26 — End: 2019-09-25

## 2019-09-25 MED ORDER — INFLUENZA VAC SPLIT QUAD 0.5 ML IM SUSY
0.50 | PREFILLED_SYRINGE | INTRAMUSCULAR | Status: DC
Start: ? — End: 2019-09-25

## 2019-09-26 DIAGNOSIS — R109 Unspecified abdominal pain: Secondary | ICD-10-CM | POA: Diagnosis not present

## 2019-09-26 DIAGNOSIS — K289 Gastrojejunal ulcer, unspecified as acute or chronic, without hemorrhage or perforation: Secondary | ICD-10-CM | POA: Diagnosis not present

## 2019-09-26 DIAGNOSIS — Z980 Intestinal bypass and anastomosis status: Secondary | ICD-10-CM | POA: Diagnosis not present

## 2019-09-27 DIAGNOSIS — G8929 Other chronic pain: Secondary | ICD-10-CM | POA: Diagnosis not present

## 2019-09-27 DIAGNOSIS — R1012 Left upper quadrant pain: Secondary | ICD-10-CM | POA: Diagnosis not present

## 2019-09-27 DIAGNOSIS — Z9884 Bariatric surgery status: Secondary | ICD-10-CM | POA: Diagnosis not present

## 2019-09-27 DIAGNOSIS — R1011 Right upper quadrant pain: Secondary | ICD-10-CM | POA: Diagnosis not present

## 2019-09-28 DIAGNOSIS — Z9884 Bariatric surgery status: Secondary | ICD-10-CM | POA: Diagnosis not present

## 2019-09-28 DIAGNOSIS — G8929 Other chronic pain: Secondary | ICD-10-CM | POA: Diagnosis not present

## 2019-09-28 DIAGNOSIS — R1011 Right upper quadrant pain: Secondary | ICD-10-CM | POA: Diagnosis not present

## 2019-09-28 DIAGNOSIS — R109 Unspecified abdominal pain: Secondary | ICD-10-CM | POA: Diagnosis not present

## 2019-09-28 DIAGNOSIS — R1012 Left upper quadrant pain: Secondary | ICD-10-CM | POA: Diagnosis not present

## 2019-09-30 DIAGNOSIS — R1012 Left upper quadrant pain: Secondary | ICD-10-CM | POA: Diagnosis not present

## 2019-09-30 DIAGNOSIS — G43709 Chronic migraine without aura, not intractable, without status migrainosus: Secondary | ICD-10-CM | POA: Diagnosis not present

## 2019-09-30 DIAGNOSIS — G8929 Other chronic pain: Secondary | ICD-10-CM | POA: Diagnosis not present

## 2019-09-30 DIAGNOSIS — R062 Wheezing: Secondary | ICD-10-CM | POA: Diagnosis not present

## 2019-10-01 DIAGNOSIS — K839 Disease of biliary tract, unspecified: Secondary | ICD-10-CM | POA: Diagnosis not present

## 2019-10-01 DIAGNOSIS — R748 Abnormal levels of other serum enzymes: Secondary | ICD-10-CM | POA: Diagnosis not present

## 2019-10-01 DIAGNOSIS — Z9884 Bariatric surgery status: Secondary | ICD-10-CM | POA: Diagnosis not present

## 2019-10-01 DIAGNOSIS — K838 Other specified diseases of biliary tract: Secondary | ICD-10-CM | POA: Diagnosis not present

## 2019-10-02 DIAGNOSIS — K859 Acute pancreatitis without necrosis or infection, unspecified: Secondary | ICD-10-CM | POA: Diagnosis not present

## 2019-10-03 ENCOUNTER — Other Ambulatory Visit: Payer: Self-pay | Admitting: Family Medicine

## 2019-10-03 ENCOUNTER — Telehealth: Payer: Self-pay | Admitting: Family Medicine

## 2019-10-03 MED ORDER — HYDROMORPHONE HCL 4 MG PO TABS: ORAL_TABLET | ORAL | 0 refills | Status: DC

## 2019-10-03 NOTE — Telephone Encounter (Signed)
Pain  Med hosp f/u

## 2019-10-04 ENCOUNTER — Telehealth: Payer: Self-pay | Admitting: Family Medicine

## 2019-10-04 DIAGNOSIS — J45901 Unspecified asthma with (acute) exacerbation: Secondary | ICD-10-CM | POA: Diagnosis not present

## 2019-10-04 DIAGNOSIS — R109 Unspecified abdominal pain: Secondary | ICD-10-CM | POA: Diagnosis not present

## 2019-10-04 MED ORDER — POLYETHYLENE GLYCOL 3350 17 G PO PACK
17.00 | PACK | ORAL | Status: DC
Start: 2019-10-04 — End: 2019-10-04

## 2019-10-04 MED ORDER — PANTOPRAZOLE SODIUM 40 MG PO TBEC
40.00 | DELAYED_RELEASE_TABLET | ORAL | Status: DC
Start: 2019-10-04 — End: 2019-10-04

## 2019-10-04 MED ORDER — LACTATED RINGERS IV SOLN
10.00 | INTRAVENOUS | Status: DC
Start: ? — End: 2019-10-04

## 2019-10-04 MED ORDER — DICYCLOMINE HCL 10 MG PO CAPS
10.00 | ORAL_CAPSULE | ORAL | Status: DC
Start: ? — End: 2019-10-04

## 2019-10-04 MED ORDER — ALUM & MAG HYDROXIDE-SIMETH 400-400-40 MG/5ML PO SUSP
30.00 | ORAL | Status: DC
Start: ? — End: 2019-10-04

## 2019-10-04 MED ORDER — TRIAMCINOLONE ACETONIDE 0.1 % EX OINT
TOPICAL_OINTMENT | CUTANEOUS | Status: DC
Start: 2019-10-04 — End: 2019-10-04

## 2019-10-04 MED ORDER — SENNOSIDES 8.6 MG PO TABS
2.00 | ORAL_TABLET | ORAL | Status: DC
Start: 2019-10-04 — End: 2019-10-04

## 2019-10-04 MED ORDER — HYDROXYZINE HCL 25 MG PO TABS
25.00 | ORAL_TABLET | ORAL | Status: DC
Start: ? — End: 2019-10-04

## 2019-10-04 MED ORDER — HYDROMORPHONE HCL 4 MG PO TABS
4.00 | ORAL_TABLET | ORAL | Status: DC
Start: ? — End: 2019-10-04

## 2019-10-04 MED ORDER — TOPIRAMATE 100 MG PO TABS
100.00 | ORAL_TABLET | ORAL | Status: DC
Start: 2019-10-04 — End: 2019-10-04

## 2019-10-04 MED ORDER — PREDNISONE 20 MG PO TABS
40.00 | ORAL_TABLET | ORAL | Status: DC
Start: 2019-10-05 — End: 2019-10-04

## 2019-10-04 NOTE — Telephone Encounter (Signed)
Being discharged from [facility] today and she said they told her to call us for a refill on Lorecet for headaches.   [employer]

## 2019-10-04 NOTE — Telephone Encounter (Addendum)
Patient advised per Dr Nicki Reaper: 1# He is aware of her situation and what she is asking  #2 He  absolutely cannot prescribe another controlled medicine on top of the Dilaudid  Therefore stick with the Dilaudid as needed for severe pain Use Tylenol for other pain And do the best she can at coping  Patient verbalized understanding.

## 2019-10-04 NOTE — Telephone Encounter (Signed)
Patient states in the hospital they gave her Fioricet in the hospital for migraines and that is when she wants sent to [facility]. They want her taking this for headaches and not the dilaudid

## 2019-10-04 NOTE — Telephone Encounter (Signed)
We did send in the pain medication as requested. I also spoke with the hospitalist Dr. Julaine Fusi Also they stated that the GI fellow is Harolyn Rutherford, MD

## 2019-10-04 NOTE — Telephone Encounter (Signed)
1.  I am aware of her situation and what she is asking  #2 I absolutely cannot prescribe another controlled medicine on top of the Dilaudid  Therefore stick with the Dilaudid as needed for severe pain Use Tylenol for other pain And do the best she can at coping

## 2019-10-04 NOTE — Telephone Encounter (Signed)
[name] So are we saying that the patient does not want the Dilaudid?  Because this is the medication for her pain that was recommended by the [place].  I want the patient to be Penny [name] certain on this because if I send in the Fioricet I am canceling the dilaudid

## 2019-10-04 NOTE — Telephone Encounter (Signed)
Please talk with patient I talked several times with the [facility] doctors regarding Penny [name] We agreed to prescribe medication for the short-term to help her with her discomfort Dilaudid was sent in.  This is a very strong pain medicine that she was on in the hospital I would recommend the patient use it judiciously may take a half a tablet or when necessary a whole tablet to help with pain no more often than every 6 hours caution drowsiness she was sent in enough medication to last over the course of the next 5 days or potentially longer if she does not have to take it as often  Nurses I would like for the patient to do a follow-up visit Monday or Tuesday this could be in person or virtual Also please double check to make sure referral for pain management with [facility] is in the process

## 2019-10-04 NOTE — Telephone Encounter (Signed)
Patient states she was given both in the hospital- the Fioricet specifically for headaches and the dilaudid for other pain. She states they told her not to take dilaudid for headaches.

## 2019-10-06 IMAGING — CR DG CHEST 1V PORT
1 series · 1 of 1 positions shown · non-contrast
Comparison: 04/21/2019

CLINICAL DATA: Shortness of breath, chest pain

EXAM:
PORTABLE CHEST 1 VIEW

[portable]
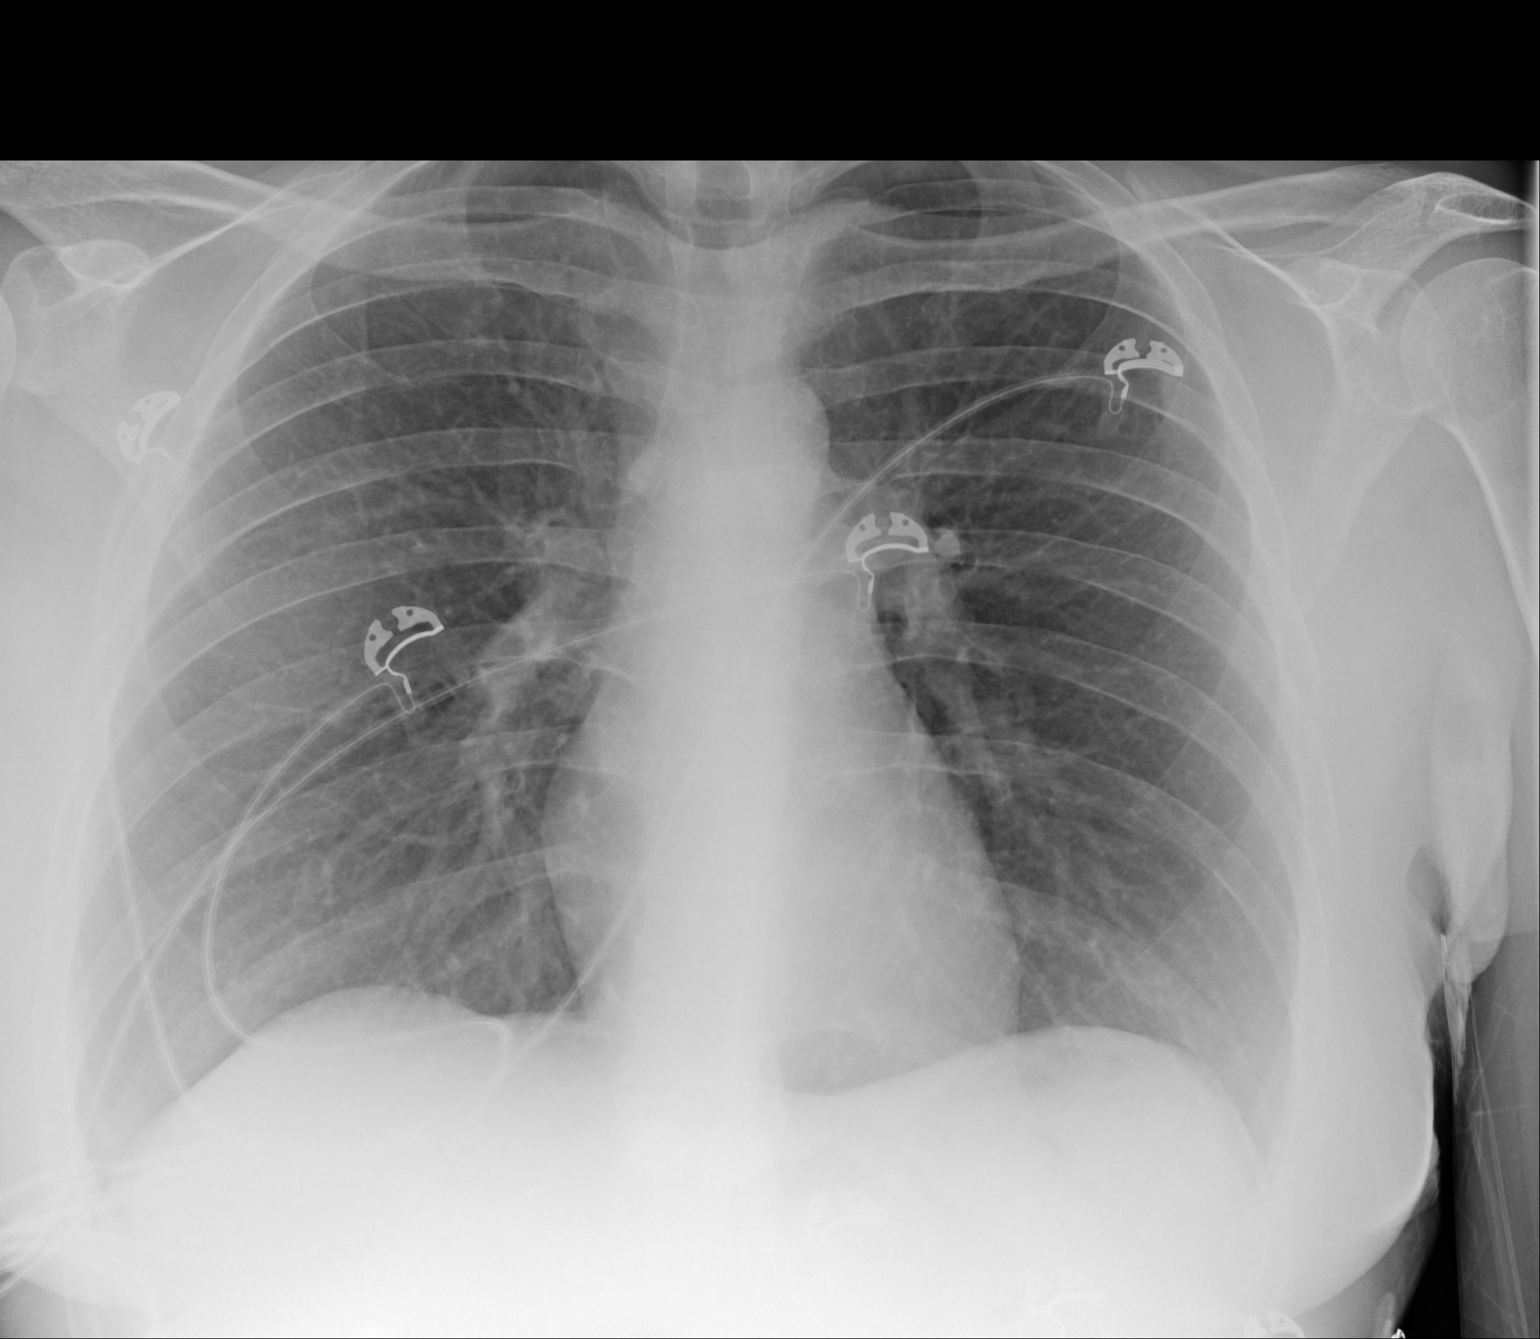

[1 of 1 positions shown; findings below may reference images not displayed]

FINDINGS: The heart size and mediastinal contours are within normal limits.
Both lungs are clear. The visualized skeletal structures are
unremarkable.
IMPRESSION: No acute abnormality of the lungs in AP portable projection.

## 2019-10-08 ENCOUNTER — Ambulatory Visit (INDEPENDENT_AMBULATORY_CARE_PROVIDER_SITE_OTHER): Payer: Medicaid Other | Admitting: Family Medicine

## 2019-10-08 DIAGNOSIS — G894 Chronic pain syndrome: Secondary | ICD-10-CM | POA: Diagnosis not present

## 2019-10-08 DIAGNOSIS — K851 Biliary acute pancreatitis without necrosis or infection: Secondary | ICD-10-CM

## 2019-10-08 MED ORDER — HYDROMORPHONE HCL 2 MG PO TABS: ORAL_TABLET | ORAL | 0 refills | Status: DC

## 2019-10-08 MED ORDER — ALBUTEROL SULFATE (2.5 MG/3ML) 0.083% IN NEBU: 2.5000 mg | INHALATION_SOLUTION | RESPIRATORY_TRACT | 12 refills | Status: DC | PRN

## 2019-10-08 MED ORDER — HYDROMORPHONE HCL 4 MG PO TABS: ORAL_TABLET | ORAL | 0 refills | Status: DC

## 2019-10-08 NOTE — Progress Notes (Signed)
Subjective:    Patient ID: Penny [name], female    DOB: 1986/07/18, 33 y.o.   MRN: [id number]  HPI Patient calls for a follow up on recent hospitalization for abdominal issues. Patient recently had stent placed There have to remove again in 4 weeks It did trigger a postoperative attack of pancreatitis.  Patient try to muster through as best she can She denies any major troubles except for the pain and discomfort states she was taken Dilaudid in the hospital and was requesting that this be filled.  She states it does not cause drowsiness.  She is suspended using her Ambien while she is on the pain medicine I have also cautioned her to cut back on her benzodiazepine. Patient states she is still having a lot of trouble breathing and a lot of pain. Patient the pain medicine wears off before the 6 hours is up for her to take another and she is in unbearable pain.  Virtual Visit via Video Note  I connected with [name] on 10/08/19 at  3:00 PM EST by a video enabled telemedicine application and verified that I am speaking with the correct person using two identifiers.  Location: Patient: home Provider: office   I discussed the limitations of evaluation and management by telemedicine and the availability of in person appointments. The patient expressed understanding and agreed to proceed.  History of Present Illness:    Observations/Objective:   Assessment and Plan:   Follow Up Instructions:    I discussed the assessment and treatment plan with the patient. The patient was provided an opportunity to ask questions and all were answered. The patient agreed with the plan and demonstrated an understanding of the instructions.   The patient was advised to call back or seek an in-person evaluation if the symptoms worsen or if the condition fails to improve as anticipated.  I provided 15 minutes of non-face-to-face time during this encounter.       Review of  Systems  Constitutional: Negative for activity change and appetite change.  HENT: Negative for congestion and rhinorrhea.   Respiratory: Negative for cough and shortness of breath.   Cardiovascular: Negative for chest pain and leg swelling.  Gastrointestinal: Positive for abdominal pain. Negative for nausea and vomiting.  Skin: Negative for color change.  Neurological: Negative for dizziness and weakness.  Psychiatric/Behavioral: Negative for agitation and confusion.       Objective:   Physical Exam  Today's visit was via telephone Physical exam was not possible for this visit   25 minutes was spent with the patient.  This statement verifies that 25 minutes was indeed spent with the patient.  More than 50% of this visit-total duration of the visit-was spent in counseling and coordination of care. The issues that the patient came in for today as reflected in the diagnosis (s) please refer to documentation for further details.     Assessment & Plan:  Chronic abdominal pain Recent sphincter of Oddi issues Sphincterotomy done by [facility] They placed a stent This is triggered postoperative pancreatitis  Should be noted that the GI specialist at [facility] did not feel that this would take away all of her chronic pain. Given that chronic pain is a complex issue I told the patient that she would be best served by pain management center.  This patient does have a tendency toward multiple physical ailments and physical pain complaints.  I have told her that no medication will totally take away her  pain.  I have also told her that high doses of pain medicine not only can be addictive but can be dangerous and should not be combined with benzodiazepines or sleep medicines.  I have encouraged patient to get away from Ambien.  Minimize her benzodiazepine that her psychiatrist prescribes  Several years ago we prescribed pain medicine for this patient and it was very difficult to keep the patient on the  prescribed dosing and due to some irregularities within the patient physician interaction we decided that we would not be able to prescribe her pain medications ongoing  I will go ahead with a short prescription of pain medicine because of her postoperative aspect but long-term management of her pain must be under a pain management center that would help keep her accountable.  Also she will be establishing herself with [facility] for pain management.  The patient will do a follow-up with [facility] for stent removal in approximately 3-1/2 weeks  Initially 4 mg tablet Dilaudid was sent in but pharmacy only had 2 mg they dispense 35 tablets she will take 2 tablets every 4-6 hours as needed for pain no more than 10 tablets/day

## 2019-10-09 ENCOUNTER — Telehealth: Payer: Self-pay | Admitting: Family Medicine

## 2019-10-09 NOTE — Telephone Encounter (Signed)
Patient has an appointment coming up with [facility] located on the [address] in [place] Phone number [phone]  It would be beneficial for their pain management to have her most recent office visit note from 10/08/2019  Thank you for sending it there

## 2019-10-10 ENCOUNTER — Other Ambulatory Visit: Payer: Self-pay

## 2019-10-10 ENCOUNTER — Emergency Department (HOSPITAL_COMMUNITY)
Admission: EM | Admit: 2019-10-10 | Discharge: 2019-10-10 | Disposition: A | Discharge status: Home / Self Care | Payer: Medicaid Other

## 2019-10-11 ENCOUNTER — Other Ambulatory Visit: Payer: Self-pay | Admitting: Family Medicine

## 2019-10-11 ENCOUNTER — Telehealth: Payer: Self-pay | Admitting: Family Medicine

## 2019-10-11 MED ORDER — HYDROMORPHONE HCL 4 MG PO TABS: ORAL_TABLET | ORAL | 0 refills | Status: DC

## 2019-10-11 NOTE — Telephone Encounter (Signed)
Left message to return call 

## 2019-10-11 NOTE — Telephone Encounter (Signed)
Patient states breathing treatment not working.I could barely hear her than she hung up.Please advise

## 2019-10-11 NOTE — Telephone Encounter (Signed)
Patient states she continues to have significant SOB and taking her breathing treatments every 4 hours but does not feel they are working. Please advise

## 2019-10-11 NOTE — Telephone Encounter (Signed)
Nurses  We did prescribe patient some pain medication on Monday pharmacy only able to give the patient 3 days worth of pain medicine Patient has some very challenging issues #1 please check base with [facility] to see if they have either Dilaudid 2 mg or Dilaudid 4 mg in stock?  This patient gets her pain medicines through them  #2 please call [facility] to see if the patient did in fact schedule her office visit with them for the pain clinic and if so when is this appointment? You will find the phone number for them in my last telephone message  #3 please then send me an update regarding the above so I can proactively prescribe her medication accordingly

## 2019-10-11 NOTE — Telephone Encounter (Signed)
Nurses I sent in a prescription for the 4 mg This is a 5-day prescription no greater than 5/day Patient needs to follow-up office visit Monday at 11 AM this may be virtual

## 2019-10-11 NOTE — Telephone Encounter (Signed)
[facility] does have the Dilaudid 4mg  in stock  Patient has appt with [facility] Nov 16 at 2:25pm- patient is aware of appt per [facility]

## 2019-10-11 NOTE — Telephone Encounter (Signed)
For multiple reasons her distress may represent serious acute difficulties so would rec an er visit

## 2019-10-11 NOTE — Telephone Encounter (Signed)
Patient advised per Dr Richardson Landry: For multiple reasons her distress may represent serious acute difficulties so would rec an ER visit. Patient verbalized understanding.

## 2019-10-11 NOTE — Telephone Encounter (Signed)
Patient advised Dr Nicki Reaper sent in a prescription for the 4 mg Dilaudid This is a 5-day prescription no greater than 5/day Patient needs to follow-up office visit Monday at 11 AM this may be virtual Patient verbalized understanding.

## 2019-10-15 ENCOUNTER — Encounter (HOSPITAL_COMMUNITY): Payer: Self-pay | Admitting: Psychiatry

## 2019-10-15 ENCOUNTER — Other Ambulatory Visit: Payer: Self-pay

## 2019-10-15 ENCOUNTER — Ambulatory Visit (INDEPENDENT_AMBULATORY_CARE_PROVIDER_SITE_OTHER): Payer: Medicaid Other | Admitting: Psychiatry

## 2019-10-15 ENCOUNTER — Ambulatory Visit (INDEPENDENT_AMBULATORY_CARE_PROVIDER_SITE_OTHER): Payer: Medicaid Other | Admitting: Family Medicine

## 2019-10-15 DIAGNOSIS — G894 Chronic pain syndrome: Secondary | ICD-10-CM

## 2019-10-15 DIAGNOSIS — F332 Major depressive disorder, recurrent severe without psychotic features: Secondary | ICD-10-CM | POA: Diagnosis not present

## 2019-10-15 DIAGNOSIS — J019 Acute sinusitis, unspecified: Secondary | ICD-10-CM | POA: Diagnosis not present

## 2019-10-15 DIAGNOSIS — F900 Attention-deficit hyperactivity disorder, predominantly inattentive type: Secondary | ICD-10-CM | POA: Diagnosis not present

## 2019-10-15 DIAGNOSIS — F411 Generalized anxiety disorder: Secondary | ICD-10-CM

## 2019-10-15 DIAGNOSIS — K834 Spasm of sphincter of Oddi: Secondary | ICD-10-CM

## 2019-10-15 MED ORDER — AMPHETAMINE-DEXTROAMPHET ER 30 MG PO CP24: ORAL_CAPSULE | ORAL | 0 refills | Status: DC

## 2019-10-15 MED ORDER — AMPHETAMINE-DEXTROAMPHET ER 30 MG PO CP24: 30.0000 mg | ORAL_CAPSULE | Freq: Every day | ORAL | 0 refills | Status: DC

## 2019-10-15 MED ORDER — BELSOMRA 20 MG PO TABS: 20.0000 mg | ORAL_TABLET | Freq: Every day | ORAL | 2 refills | Status: DC

## 2019-10-15 MED ORDER — TOPIRAMATE 50 MG PO TABS: 100.0000 mg | ORAL_TABLET | Freq: Two times a day (BID) | ORAL | 2 refills | Status: DC

## 2019-10-15 MED ORDER — DULOXETINE HCL 60 MG PO CPEP: 60.0000 mg | ORAL_CAPSULE | Freq: Two times a day (BID) | ORAL | 2 refills | Status: DC

## 2019-10-15 MED ORDER — AMOXICILLIN 500 MG PO CAPS: ORAL_CAPSULE | ORAL | 0 refills | Status: DC

## 2019-10-15 MED ORDER — FLUCONAZOLE 150 MG PO TABS: 150.0000 mg | ORAL_TABLET | Freq: Once | ORAL | 0 refills | Status: AC

## 2019-10-15 MED ORDER — CLONAZEPAM 0.5 MG PO TABS: 0.5000 mg | ORAL_TABLET | Freq: Three times a day (TID) | ORAL | 2 refills | Status: DC | PRN

## 2019-10-15 MED ORDER — HYDROMORPHONE HCL 2 MG PO TABS: ORAL_TABLET | ORAL | 0 refills | Status: DC

## 2019-10-15 NOTE — Progress Notes (Signed)
Virtual Visit via Telephone Note  I connected with Penny [name] on 10/15/19 at 10:40 AM EST by telephone and verified that I am speaking with the correct person using two identifiers.   I discussed the limitations, risks, security and privacy concerns of performing an evaluation and management service by telephone and the availability of in person appointments. I also discussed with the patient that there may be a patient responsible charge related to this service. The patient expressed understanding and agreed to proceed.     I discussed the assessment and treatment plan with the patient. The patient was provided an opportunity to ask questions and all were answered. The patient agreed with the plan and demonstrated an understanding of the instructions.   The patient was advised to call back or seek an in-person evaluation if the symptoms worsen or if the condition fails to improve as anticipated.  I provided 15 minutes of non-face-to-face time during this encounter.   [name], MD  [facility] MD/PA/NP OP Progress Note  10/15/2019 11:05 AM Penny [name]  MRN:  [id number]  Chief Complaint:  Chief Complaint    Depression; Anxiety; Follow-up     HPI: This patient is a 33 year old female who is currently living with her boyfriend 22-year-old daughter and her boyfriend's 2 daughters in [place].  Her son goes between her home and his father's home.  She is currently not working and applying for disability.  The patient returns after 3 months for follow-up of her mood swings anxiety ADHD and depression.  Recently she has had significant abdominal problems.  She has been admitted to the hospital several times for severe abdominal pain and last month was admitted to [facility] for work-up.  She will underwent an ERCP and was found to have sphincter of Oddi dysfunction and a stent was placed.  She still having significant pain but it is better than it was.  Dr. Wolfgang Phoenix is  temporarily prescribing Dilaudid for her but she is supposed to see the pain management clinic next month.  She claims that the Ambien is not helping her sleep so I suggested we switch to [place] which is a nonaddictive medication and safer with the use of pain meds.  She states that she often has trouble breathing and has to use breathing treatments but her oxygen saturation in the hospital was good and there was no evidence of lung disease.  I question whether some of this is due to anxiety and she thinks it is and she continues to use the clonazepam 0.5 mg 3 times daily.  I explained that we cannot go up on the dose if she is going to be on pain medication.  The patient states her mood is fairly good and she continues the Cymbalta 60 mg twice daily and Topamax 100 mg twice daily.  Her fianc is very supportive as is her family.  She denies any thoughts of self-harm or suicidal ideation but her energy is still low and she sometimes has trouble getting out of bed due to pain. Visit Diagnosis:    ICD-10-CM   1. Major depressive disorder, recurrent episode, 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[place])  F33.2   2. GAD (generalized anxiety disorder)  F41.1   3. Attention deficit hyperactivity disorder (ADHD), predominantly inattentive type  F90.0     Past Psychiatric History: Psychiatric hospitalization last year for depression  Past Medical History:  Past Medical History:  Diagnosis Date  . ADD (attention deficit disorder)   . Anemia  2011   2o to GASTRIC BYPASS  . Anginal pain ([place])   . Anxiety   . Blood transfusion without reported diagnosis   . Chronic daily headache   . Depression   . Depression   . Dysmenorrhea 09/18/2013  . Dysrhythmia   . Elevated liver enzymes JUL 2011ALK PHOS 111-127 AST  143-267 ALT  213-321T BILI 0.6  ALB  3.7-4.06 Jun 2011 ALK PHOS 118 AST 24 ALT 42 T BILI 0.4 ALB 3.9  . Encounter for drug rehabilitation    at behavioral health for opioid addiction about 4 years ago   . Family history of adverse reaction to anesthesia    'dad had to be kept on pump for breathing for morphine'  . Fatty liver   . Fibroid 01/18/2017  . Fibromyalgia   . Gastric bypass status for obesity   . Gastritis JULY 2011  . Heart murmur   . 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 History of Holter monitoring   . Hx of opioid abuse ([place])    for about 4 years, about 4 years ago  . Interstitial cystitis   . Iron deficiency anemia 07/23/2010  . Irritable bowel syndrome 2012 DIARRHEA   JUN 2012 TTG IgA 14.9  . IUD FEB 2010  . Lupus ([place])   . Menorrhagia 07/18/2013  . Migraines   . Obesity (BMI 30-39.9) 2011 228 LBS BMI 36.8  . Ovarian cyst   . Patient desires pregnancy 09/18/2013  . Polyneuropathy   . PONV (postoperative nausea and vomiting) seizure post-operatively   seizure following ankle surgery  . Potassium (K) deficiency   . Pregnant 12/25/2013  . Psychiatric pseudoseizure   . RLQ abdominal pain 07/18/2013  . Sciatica of left side 11/14/2014  . 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 Stress 09/18/2013    Past Surgical History:  Procedure Laterality Date  . BIOPSY  04/10/2015   Procedure: BIOPSY;  Surgeon: Daneil Dolin, MD;  Location: [facility];  Service: Endoscopy;;  . cath self every nite     for sodium bicarb injection (discontinued 2013)  . CHOLECYSTECTOMY  2005   biliary dyskinesia  . COLONOSCOPY  JUN 2012 ABD PN/DIARRHEA WITH PROPOFOL   NL COLON  . DILATION AND CURETTAGE OF UTERUS    . DILITATION & CURRETTAGE/HYSTROSCOPY WITH NOVASURE ABLATION N/A 03/24/2017   Procedure: DILATATION & CURETTAGE/HYSTEROSCOPY WITH NOVASURE ENDOMETRIAL ABLATION;  Surgeon: Jonnie Kind, MD;  Location: [facility];  Service: Gynecology;  Laterality: N/A;  . ESOPHAGEAL DILATION N/A 04/10/2015   Procedure: ESOPHAGEAL DILATION WITH 54FR MALONEY DILATOR;  Surgeon: Daneil Dolin, MD;  Location: [facility];  Service: Endoscopy;  Laterality: N/A;  .  ESOPHAGOGASTRODUODENOSCOPY    . ESOPHAGOGASTRODUODENOSCOPY (EGD) WITH PROPOFOL N/A 04/10/2015   Procedure: ESOPHAGOGASTRODUODENOSCOPY (EGD) WITH PROPOFOL;  Surgeon: Daneil Dolin, MD;  Location: [facility];  Service: Endoscopy;  Laterality: N/A;  . ESOPHAGOGASTRODUODENOSCOPY (EGD) WITH PROPOFOL N/A 12/06/2016   Procedure: ESOPHAGOGASTRODUODENOSCOPY (EGD) WITH PROPOFOL;  Surgeon: Danie Binder, MD;  [facility];  Service: Endoscopy;  Laterality: N/A;  . ESOPHAGOGASTRODUODENOSCOPY (EGD) WITH PROPOFOL N/A 04/27/2019   Procedure: ESOPHAGOGASTRODUODENOSCOPY (EGD) WITH PROPOFOL;  Surgeon: Rogene Houston, MD;  [facility];  Service: Endoscopy;  Laterality: N/A;  730  . ESOPHAGOGASTRODUODENOSCOPY (EGD) WITH PROPOFOL N/A 08/31/2019   Procedure: ESOPHAGOGASTRODUODENOSCOPY (EGD) WITH PROPOFOL;  Surgeon: Rogene Houston, MD;  [facility];  Service: Endoscopy;  Laterality: N/A;  . Danella Penton  2007   in [place]-POUCH 5 CM  . GASTRIC BYPASS  06/2006  . HYSTEROSCOPY W/D&C N/A 09/12/2014   Procedure: DILATATION AND CURETTAGE /HYSTEROSCOPY;  Surgeon: Jonnie Kind, MD;  Location: [facility];  Service: Gynecology;  Laterality: N/A;  . KYPHOPLASTY N/A 08/02/2018   Procedure: KYPHOPLASTY T12;  Surgeon: Melina Schools, MD;  Location: [place];  Service: Orthopedics;  Laterality: N/A;  60 mins  . LAPAROSCOPIC TUBAL LIGATION Bilateral 03/24/2017   Procedure: LAPAROSCOPIC TUBAL LIGATION ([place] Rings);  Surgeon: Jonnie Kind, MD;  Location: [facility];  Service: Gynecology;  Laterality: Bilateral;  . REPAIR VAGINAL CUFF N/A 07/30/2014   Procedure: REPAIR VAGINAL CUFF;  Surgeon: Mora Bellman, MD;  Location: [facility];  Service: Gynecology;  Laterality: N/A;  . SAVORY DILATION  06/20/2012   Dr. Barnie Alderman gastritis/Ulcer in the mid jejunum. Empiric dilation.   . SURAL NERVE BX Left 02/25/2016   Procedure: LEFT SURAL NERVE BIOPSY;  Surgeon: Jovita Gamma, MD;  Location: [facility];  Service:  Neurosurgery;  Laterality: Left;  Left sural nerve biopsy  . TONSILLECTOMY    . TONSILLECTOMY AND ADENOIDECTOMY    . UPPER GASTROINTESTINAL ENDOSCOPY  JULY 2011 NAUSEA-D125,V6, PH 25   Bx; GASTRITIS, POUCH-5 CM LONG  . WISDOM TOOTH EXTRACTION      Family Psychiatric History: see below  Family History:  Family History  Problem Relation Age of Onset  . Hemochromatosis Maternal Grandmother   . Migraines Maternal Grandmother   . Cancer Maternal Grandmother   . Breast cancer Maternal Grandmother   . Hypertension Father   . Diabetes Father   . Coronary artery disease Father   . Migraines Paternal Grandmother   . Breast cancer Paternal Grandmother   . Cancer Mother        breast  . Hemochromatosis Mother   . Breast cancer Mother   . Depression Mother   . Anxiety disorder Mother   . Coronary artery disease Paternal Grandfather   . Anxiety disorder Brother   . Bipolar disorder Brother   . Healthy Daughter   . Healthy Son     Social History:  Social History   Socioeconomic History  . Marital status: Divorced    Spouse name: Not on file  . Number of children: 2  . Years of education: some colle  . Highest education level: Not on file  Occupational History    Employer: NOT EMPLOYED  Social Needs  . Financial resource strain: Very hard  . Food insecurity    Worry: Never true    Inability: Never true  . Transportation needs    Medical: No    Non-medical: No  Tobacco Use  . Smoking status: Former Smoker    Packs/day: 0.25    Years: 6.00    Pack years: 1.50    Types: E-cigarettes    Start date: 05/2018  . Smokeless tobacco: Never Used  Substance and Sexual Activity  . Alcohol use: Yes    Alcohol/week: 0.0 standard drinks    Comment: 1-2 beers a couple times a month   . Drug use: Not Currently    Types: Marijuana    Comment: Previously opioid addiction went through rehab  . Sexual activity: Yes    Partners: Male    Birth control/protection: Surgical, Condom     Comment: tubal and ablation  Lifestyle  . Physical activity    Days per week: 0 days    Minutes per session: 0 min  . Stress: Very much  Relationships  . Social connections    Talks on phone: More than three times a week    Gets together: More than three times a week    Attends religious service: More than 4 times per year    Active member of club or organization: No    Attends meetings of clubs or organizations: Never    Relationship status: Divorced  Other Topics Concern  . Not on file  Social History Narrative   Patient is right handed.   Patient drinks one cup of coffee daily.   She lives in a one-story home with her two children (61 year old son, 76 month old daughter).   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 Gabapentin Other (See Comments)    Pt states that she was unresponsive after taking this medication, but her vitals remained stable.    . Metoclopramide Hcl Anxiety and Other (See Comments)    Pt states that she felt like she was trapped in a box, and could not get out.  Pt also states that she had temporary loss of movement, weakness, and tingling.    . Tramadol Other (See Comments)    Seizures  . Ativan [Lorazepam] Other (See Comments)    combative  . Latex Itching, Rash and Other (See Comments)    Burns  . Lyrica [Pregabalin] Other (See Comments)    suicidal  . Trazodone And Nefazodone Other (See Comments)    Makes pt "like a zombie"  . Zofran [Ondansetron] Other (See Comments)    Migraines in high doses    . Sulfa Antibiotics Hives  . Butrans [Buprenorphine] Rash    Patch caused rash, didn't help with pain  . Nucynta [Tapentadol] Nausea And Vomiting  . Propofol Nausea And Vomiting  . Tape Itching, Rash and Other (See Comments)    Please use paper tape  . Toradol [Ketorolac Tromethamine] Anxiety    Metabolic Disorder Labs: Lab Results  Component Value  Date   HGBA1C 5.7 (H) 01/15/2015   Lab Results  Component Value Date   PROLACTIN 80.0 (H) 05/09/2015   Lab Results  Component Value Date   CHOL 95 03/10/2012   TRIG 50 03/10/2012   HDL 38 (L) 03/10/2012   CHOLHDL 2.5 03/10/2012   VLDL 10 03/10/2012   LDLCALC 47 03/10/2012   Lab Results  Component Value Date   TSH 2.616 08/25/2019   TSH 1.592 05/25/2019    Therapeutic Level Labs: No results found for: LITHIUM No results found for: VALPROATE No components found for:  CBMZ  Current Medications: Current Outpatient Medications  Medication Sig Dispense Refill  . albuterol (PROVENTIL) (2.5 MG/3ML) 0.083% nebulizer solution Take 3 mLs (2.5 mg total) by nebulization every 4 (four) hours as needed for wheezing. 150 mL 12  . albuterol (VENTOLIN HFA) 108 (90 Base) MCG/ACT inhaler Inhale 2 puffs into the lungs every 6 (six) hours as needed for wheezing or shortness of breath. 6.7 g 0  . amphetamine-dextroamphetamine (ADDERALL XR) 30 MG 24 hr capsule TAKE (1) CAPSULE BY MOUTH EVERY DAY. 30 capsule 0  . amphetamine-dextroamphetamine (ADDERALL XR) 30 MG 24 hr capsule Take 1 capsule (30 mg total) by mouth daily. 30 capsule 0  . clonazePAM (KLONOPIN) 0.5 MG tablet Take 1 tablet (0.5 mg total) by mouth 3 (three) times daily as needed for anxiety. 90 tablet 2  . DULoxetine (CYMBALTA)  60 MG capsule Take 1 capsule (60 mg total) by mouth 2 (two) times daily. 180 capsule 2  . HYDROmorphone (DILAUDID) 4 MG tablet 1 every 4 hours as needed severe pain caution drowsiness no greater than 5/day 25 tablet 0  . ondansetron (ZOFRAN-ODT) 4 MG disintegrating tablet Take 1 tablet (4 mg total) by mouth every 8 (eight) hours as needed for nausea or vomiting. 20 tablet 0  . pantoprazole (PROTONIX) 40 MG tablet Take 1 tablet (40 mg total) by mouth 2 (two) times daily before a meal. 60 tablet 3  . Suvorexant (BELSOMRA) 20 MG TABS Take 20 mg by mouth at bedtime. 30 tablet 2  . topiramate (TOPAMAX) 50 MG tablet Take 2  tablets (100 mg total) by mouth 2 (two) times daily. 60 tablet 2  . valACYclovir (VALTREX) 1000 MG tablet Take 1 bid for 10 days then Take  1 daily for suppression (Patient taking differently: Take 500 mg by mouth See admin instructions. Take 1 twice daily  for 10 days then Take  1 daily for suppression) 90 tablet prn   No current facility-administered medications for this visit.      Musculoskeletal: Strength & Muscle Tone: within normal limits Gait & Station: normal Patient leans: N/A  Psychiatric Specialty Exam: Review of Systems  Constitutional: Positive for weight loss.  Gastrointestinal: Positive for abdominal pain and heartburn.  Psychiatric/Behavioral: The patient is nervous/anxious and has insomnia.   All other systems reviewed and are negative.   There were no vitals taken for this visit.There is no height or weight on file to calculate BMI.  General Appearance: NA  Eye Contact:  NA  Speech:  Clear and Coherent  Volume:  Decreased  Mood:  Anxious  Affect:  NA  Thought Process:  Goal Directed  Orientation:  Full (Time, Place, and Person)  Thought Content: Rumination   Suicidal Thoughts:  No  Homicidal Thoughts:  No  Memory:  Immediate;   Good Recent;   Good Remote;   Good  Judgement:  Fair  Insight:  Fair  Psychomotor Activity:  Decreased  Concentration:  Concentration: Good and Attention Span: Good  Recall:  Good  Fund of Knowledge: Good  Language: Good  Akathisia:  No  Handed:  Right  AIMS (if indicated): not done  Assets:  Communication Skills Desire for Improvement Resilience Social Support Talents/Skills  ADL's:  Intact  Cognition: WNL  Sleep:  Poor   Screenings: AUDIT     ED to Hosp-Admission (Discharged) from 05/09/2015 in [place] [facility]  Alcohol Use Disorder Identification Test Final Score (AUDIT)  0    GAD-7     Counselor from 12/11/2018 in [place] Counselor from 02/07/2017  in [place] [facility]  Total GAD-7 Score  19  17    PHQ2-9     Counselor from 12/11/2018 in [facility] Counselor from 07/22/2017 in [place] Counselor from 02/07/2017 in [place] Office Visit from 01/10/2017 in [facility] Counselor from 10/06/2016 in [place] [facility]  PHQ-2 Total Score  _0 PHQ-9 Total Score  _1 Assessment and Plan: This patient is a 33 year old female with a history of depression anxiety ADHD.  In the past she has had difficulty managing controlled prescribe drugs both for myself and  from her primary physician.  We are both keeping a close eye on this.  I would rather she not take Ambien and we will switch to Belsomra 20 mg at bedtime for sleep.  She will continue Cymbalta 60 mg twice daily for depression and chronic pain, clonazepam 0.5 mg 3 times daily for anxiety, Adderall XR 30 mg daily for focus and Topamax 100 mg twice daily for mood stabilization.  She will return to see me in 2 months   [name], MD 10/15/2019, 11:05 AM

## 2019-10-15 NOTE — Progress Notes (Signed)
Subjective:    Patient ID: Penny [name], female    DOB: [birth year], 33 y.o.   MRN: [id number]  HPI This patient was seen today for chronic pain  The medication list was reviewed and updated.   -Compliance with medication: Dilaudin 4 mg  - Number patient states they take daily: 2-3  -when was the last dose patient took? This morning  The patient was advised the importance of maintaining medication and not using illegal substances with these.  Here for refills and follow up  The patient was educated that we can provide 3 monthly scripts for their medication, it is their responsibility to follow the instructions.  Side effects or complications from medications: none  Patient is aware that pain medications are meant to minimize the severity of the pain to allow their pain levels to improve to allow for better function. They are aware of that pain medications cannot totally remove their pain.  Due for UDT ( at least once per year) :   Pt states the Dilaudid helps the pain become tolerable. Pt is unable to eat and has quick on set of nausea. Has vomited once. Pt is taking sips of water, Pedialyte and Ensures.   Pt would like to know if we can "pull some strings to get her in with allergy specialist quicker". Pt states every time she coughs, she coughs up "green snot balls"  Virtual Visit via Video Note  I connected with Penny [name] on 10/15/19 at 11:00 AM EST by a video enabled telemedicine application and verified that I am speaking with the correct person using two identifiers.  Location: Patient: home Provider: office   I discussed the limitations of evaluation and management by telemedicine and the availability of in person appointments. The patient expressed understanding and agreed to proceed.  History of Present Illness:    Observations/Objective:   Assessment and Plan:   Follow Up Instructions:    I discussed the assessment and treatment  plan with the patient. The patient was provided an opportunity to ask questions and all were answered. The patient agreed with the plan and demonstrated an understanding of the instructions.   The patient was advised to call back or seek an in-person evaluation if the symptoms worsen or if the condition fails to improve as anticipated.  I provided 17 minutes of non-face-to-face time during this encounter.   Vicente Males, LPN      Review of Systems  Constitutional: Negative for activity change and fever.  HENT: Positive for congestion and rhinorrhea. Negative for ear pain.   Eyes: Negative for discharge.  Respiratory: Positive for cough. Negative for shortness of breath and wheezing.   Cardiovascular: Negative for chest pain.  Gastrointestinal: Positive for abdominal pain and nausea. Negative for diarrhea.  Musculoskeletal: Negative for arthralgias and back pain.       Objective:   Physical Exam Patient had virtual visit Appears to be in no distress Atraumatic Neuro able to relate and oriented No apparent resp distress Color normal  I believe part of her shortness of breath when she moves around is deconditioning from being in the hospital and not being active and not really moving around much     25 minutes was spent with the patient.  This statement verifies that 25 minutes was indeed spent with the patient.  More than 50% of this visit-total duration of the visit-was spent in counseling and coordination of care. The issues that the patient came in for  today as reflected in the diagnosis (s) please refer to documentation for further details.  Assessment & Plan:  Patient having ongoing wheezing according to her  Nebulizer treatment frequently bringing up some discolored phlegm recent chest x-ray look good we will go ahead with a round of antibiotics to treat for possibility of sinus  She will see asthma allergy doctor next week to see if she may need pulmonary function  testing or other measures  As for her abdominal discomfort she gets her stent out later in December I did encourage her to follow-up with gastroenterology  Patient has chronic pain and discomfort unfortunately is very difficult to control her pain she is a very nice lady but has multiple issues that contribute to the chronic pain syndrome ideally in the long run I would like to see her on the lower dose of pain medicine therefore we will lower her medication to the 2 mg tablet take 1 or 2 every 4 hours as needed for severe pain do not exceed 9/day and I would encourage her to gradually taper this when she sees a pain medicine specialist hopefully they can get her on a regimen that would be less amount of opioids but at the same time to help her with her chronic pain

## 2019-10-17 NOTE — Telephone Encounter (Signed)
done

## 2019-10-18 IMAGING — MR MR 3D RECON AT SCANNER
9 series · 16 of 16 positions shown · non-contrast
Comparison: Abdominopelvic CT 08/25/2019 and 12/18/2017. Abdominal
ultrasound 08/27/2019.

CLINICAL DATA: Abdominal pain with elevated liver function studies.
History of cholecystectomy in 9557 and previous gastric bypass.

EXAM:
MRI ABDOMEN WITHOUT CONTRAST  (INCLUDING MRCP)
TECHNIQUE: Multiplanar multisequence MR imaging of the abdomen was performed.
Heavily T2-weighted images of the biliary and pancreatic ducts were
obtained, and three-dimensional MRCP images were rendered by post
processing.

[Series 3: T2 · axial · 5.0mm · 1.09mm/px · 1 of 40 slices shown (1 of 2)]
[im 1/40]
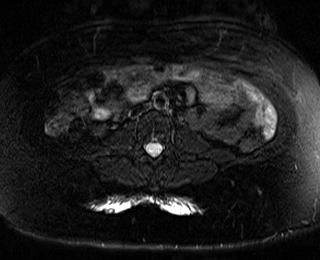

[Series 4: GRE · coronal · 5.0mm · 1.30mm/px · 1 of 36 slices shown]
[im 1/36]
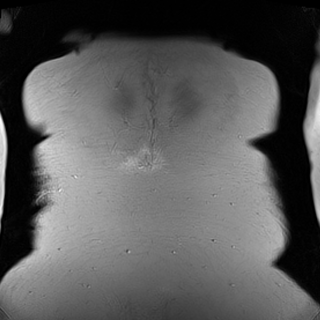

[Series 5: MRCP · coronal · 1.0mm · 0.42mm/px · 3 of 120 slices shown]
[im 1/120]
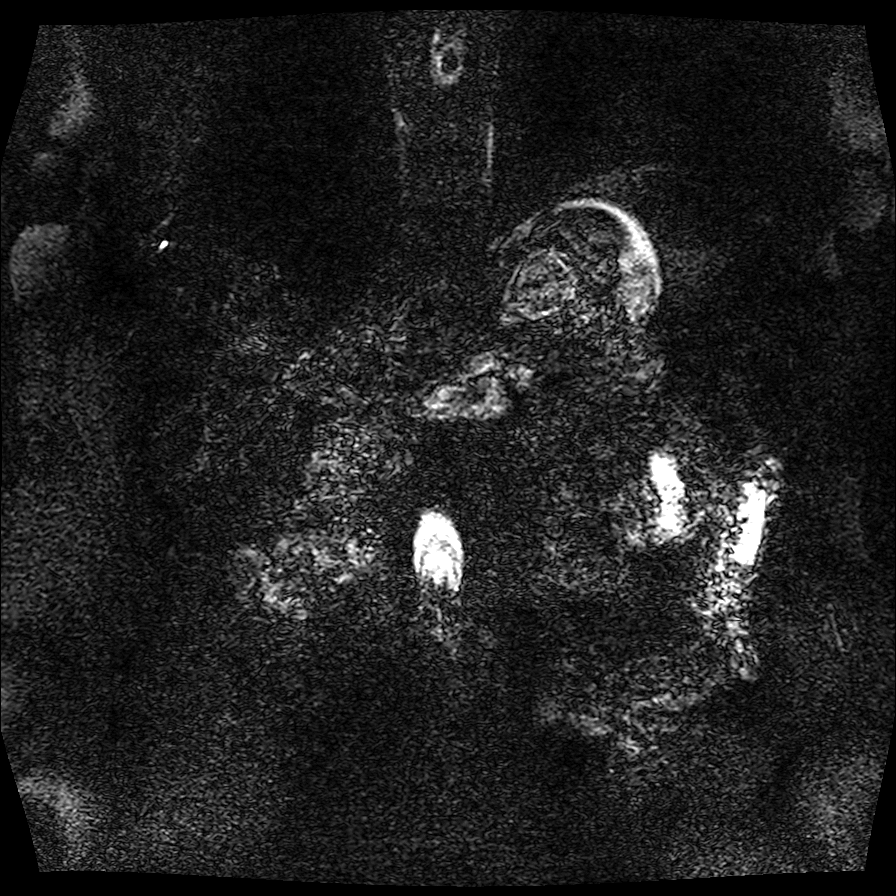
[im 60/120]
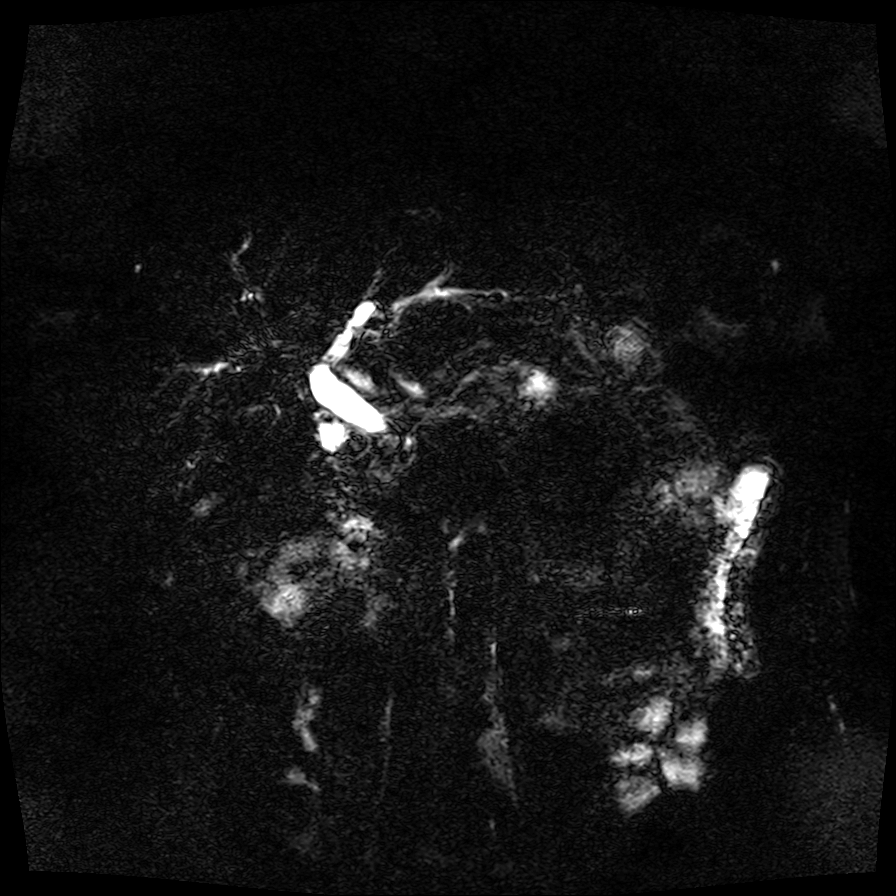
[im 120/120]
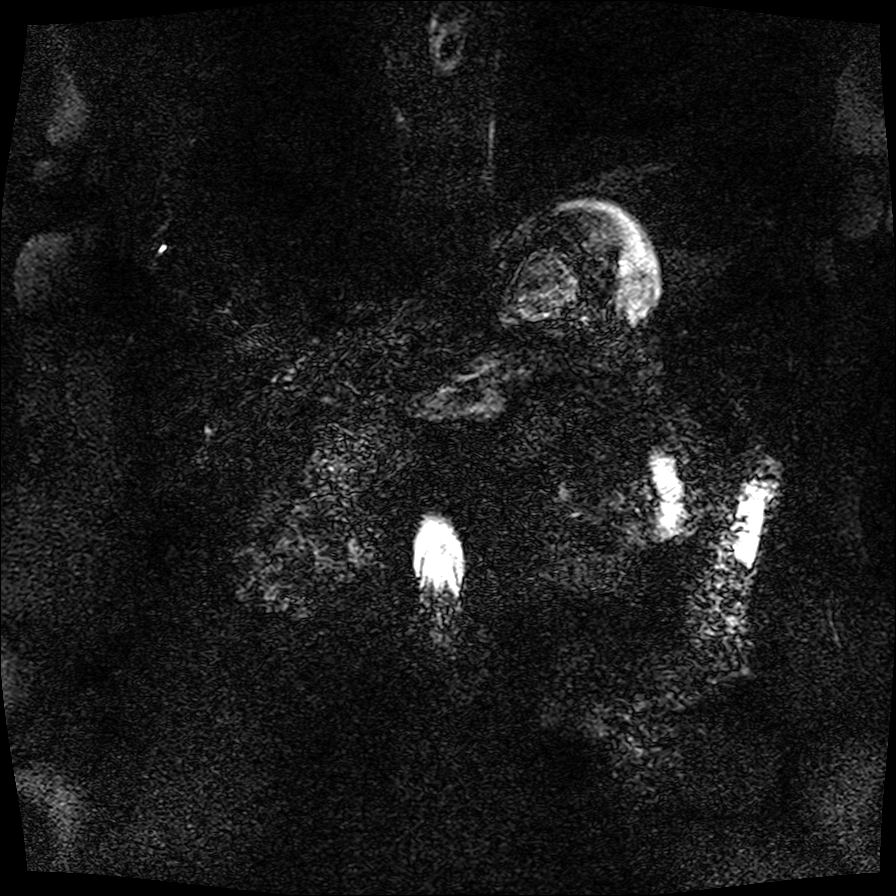

[Series 7: DWI · axial · 5.0mm · 0.99mm/px · z∈[-128,+106]mm · 2 of 80 slices shown]
[im 1/80]
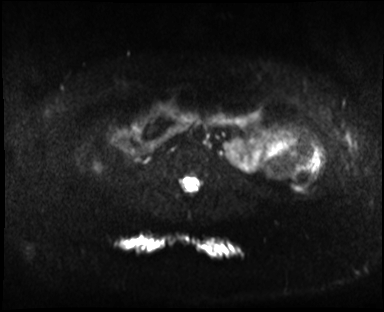
[im 80/80]
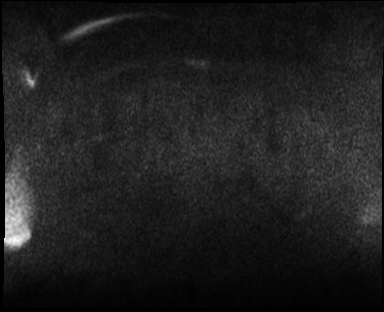

[Series 8: ax dwi_adc · axial · 5.0mm · 0.99mm/px · 1 of 40 slices shown]
[im 1/40]
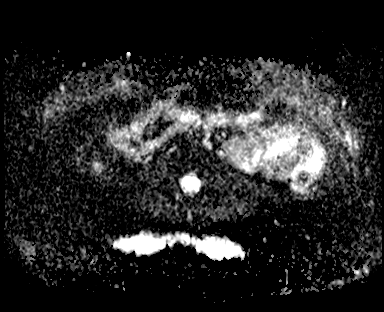

[Series 9: ax dual echo_in · axial · 4.0mm · 0.56mm/px · z∈[-137,+115]mm · 2 of 64 slices shown]
[im 1/64]
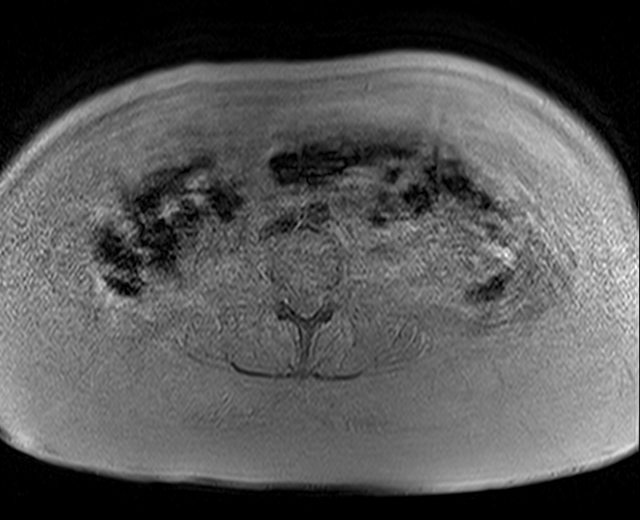
[im 64/64]
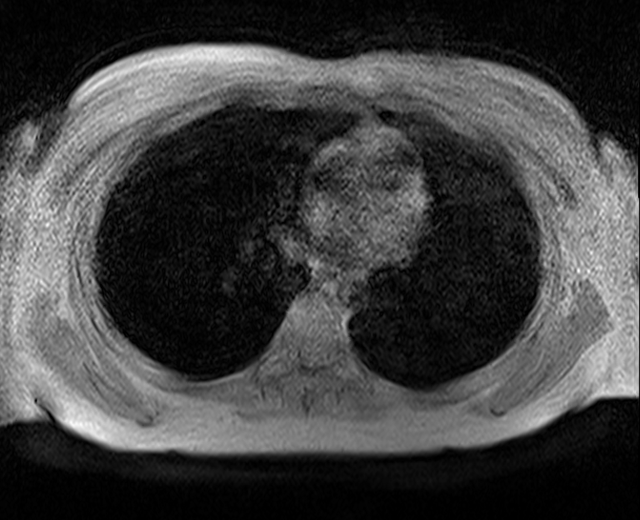

[Series 10: ax dual echo_opp · axial · 4.0mm · 0.56mm/px · z∈[-137,+115]mm · 2 of 64 slices shown]
[im 1/64]
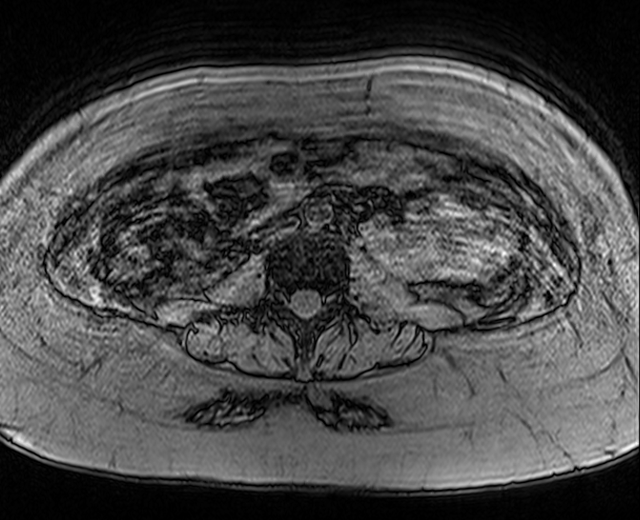
[im 64/64]
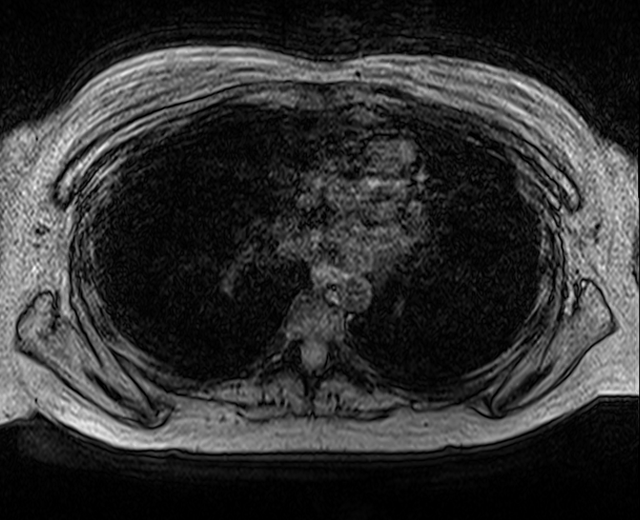

[Series 14: T1 fat-sat · coronal · 2.5mm · 1.39mm/px · 3 of 96 slices shown]
[im 1/96]
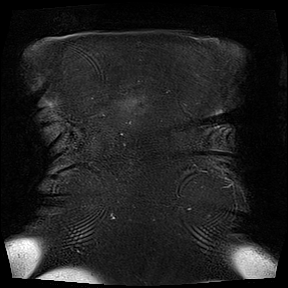
[im 48/96]
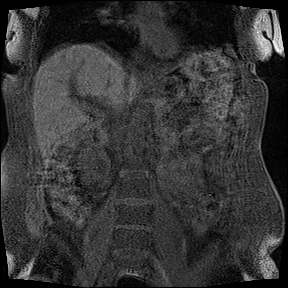
[im 96/96]
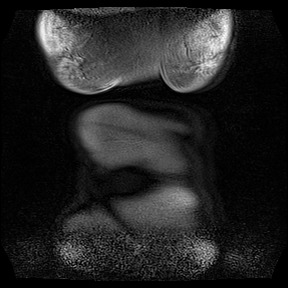

[Series 15: T2 · axial · 5.0mm · 1.36mm/px · 1 of 40 slices shown (2 of 2)]
[im 1/40]
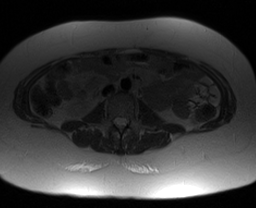

[16 of 16 positions shown; findings below may reference images not displayed]

FINDINGS: Despite efforts by the technologist and patient, mild motion
artifact is present on today's exam and could not be eliminated.
This reduces exam sensitivity and specificity.

Lower chest:  The visualized lower chest appears unremarkable.

Hepatobiliary: The liver appears normal in signal without focal
lesion or significant steatosis. Status post cholecystectomy. There
is mild intra and extrahepatic biliary dilatation, similar to
previous studies. The common hepatic duct measures 11 mm in
diameter. There is a mildly dilated cystic duct remnant. No evidence
of choledocholithiasis.

Pancreas: Unremarkable. No pancreatic ductal dilatation or
surrounding inflammatory changes.

Spleen: Normal in size without focal abnormality.

Adrenals/Urinary Tract: Both adrenal glands appear normal. The
kidneys and ureters appear normal.

Stomach/Bowel: No evidence of bowel wall thickening, distention or
surrounding inflammatory change.Stable postoperative changes in the
left upper quadrant consistent with previous gastric bypass.

Vascular/Lymphatic: There are no enlarged abdominal lymph nodes. No
significant vascular findings are present.

Other: The visualized abdominal wall is intact. No ascites. Mild
dependent subcutaneous edema in the back.

Musculoskeletal: Status post T12 spinal augmentation. No acute
osseous findings.
IMPRESSION: 1. Stable mild biliary dilatation post cholecystectomy, without
evidence of choledocholithiasis, likely physiologic.
2. No acute or focal hepatic abnormalities identified.
3. Previous gastric bypass.

## 2019-10-20 ENCOUNTER — Other Ambulatory Visit: Payer: Self-pay | Admitting: Family Medicine

## 2019-10-20 DIAGNOSIS — J690 Pneumonitis due to inhalation of food and vomit: Secondary | ICD-10-CM | POA: Diagnosis not present

## 2019-10-20 DIAGNOSIS — R0902 Hypoxemia: Secondary | ICD-10-CM | POA: Diagnosis not present

## 2019-10-20 DIAGNOSIS — K5903 Drug induced constipation: Secondary | ICD-10-CM | POA: Diagnosis not present

## 2019-10-20 DIAGNOSIS — R1084 Generalized abdominal pain: Secondary | ICD-10-CM | POA: Diagnosis not present

## 2019-10-20 DIAGNOSIS — R0602 Shortness of breath: Secondary | ICD-10-CM | POA: Diagnosis not present

## 2019-10-20 DIAGNOSIS — R112 Nausea with vomiting, unspecified: Secondary | ICD-10-CM | POA: Diagnosis not present

## 2019-10-20 DIAGNOSIS — R079 Chest pain, unspecified: Secondary | ICD-10-CM | POA: Diagnosis not present

## 2019-10-20 DIAGNOSIS — R1110 Vomiting, unspecified: Secondary | ICD-10-CM | POA: Diagnosis not present

## 2019-10-22 ENCOUNTER — Telehealth (HOSPITAL_COMMUNITY): Payer: Self-pay | Admitting: *Deleted

## 2019-10-22 DIAGNOSIS — M62830 Muscle spasm of back: Secondary | ICD-10-CM | POA: Diagnosis not present

## 2019-10-22 DIAGNOSIS — Z79891 Long term (current) use of opiate analgesic: Secondary | ICD-10-CM | POA: Diagnosis not present

## 2019-10-22 DIAGNOSIS — M5441 Lumbago with sciatica, right side: Secondary | ICD-10-CM | POA: Diagnosis not present

## 2019-10-22 DIAGNOSIS — M5412 Radiculopathy, cervical region: Secondary | ICD-10-CM | POA: Diagnosis not present

## 2019-10-22 DIAGNOSIS — G8929 Other chronic pain: Secondary | ICD-10-CM | POA: Diagnosis not present

## 2019-10-22 DIAGNOSIS — M129 Arthropathy, unspecified: Secondary | ICD-10-CM | POA: Diagnosis not present

## 2019-10-22 DIAGNOSIS — M5442 Lumbago with sciatica, left side: Secondary | ICD-10-CM | POA: Diagnosis not present

## 2019-10-22 DIAGNOSIS — M546 Pain in thoracic spine: Secondary | ICD-10-CM | POA: Diagnosis not present

## 2019-10-22 NOTE — Telephone Encounter (Signed)
[place] TRACKS SEDATIVE HYPNOTIC  PRIOR AUTHORIZATION APPROVED  [employer] TABLETS    P.A. #  [id number]              EFFECTIVE: 10-19-2019    THRU    04-16-2020

## 2019-10-25 ENCOUNTER — Encounter: Payer: Self-pay | Admitting: Allergy & Immunology

## 2019-10-25 ENCOUNTER — Ambulatory Visit (INDEPENDENT_AMBULATORY_CARE_PROVIDER_SITE_OTHER): Payer: Medicaid Other | Admitting: Allergy & Immunology

## 2019-10-25 ENCOUNTER — Other Ambulatory Visit: Payer: Self-pay

## 2019-10-25 VITALS — BP 122/86 | HR 109 | Temp 98.0°F | Resp 18 | Ht 66.0 in | Wt 185.0 lb

## 2019-10-25 DIAGNOSIS — J4520 Mild intermittent asthma, uncomplicated: Secondary | ICD-10-CM | POA: Diagnosis not present

## 2019-10-25 DIAGNOSIS — J310 Chronic rhinitis: Secondary | ICD-10-CM | POA: Diagnosis not present

## 2019-10-25 DIAGNOSIS — J4540 Moderate persistent asthma, uncomplicated: Secondary | ICD-10-CM | POA: Diagnosis not present

## 2019-10-25 MED ORDER — CETIRIZINE HCL 10 MG PO TABS: 10.0000 mg | ORAL_TABLET | Freq: Every day | ORAL | 5 refills | Status: DC

## 2019-10-25 MED ORDER — BUDESONIDE-FORMOTEROL FUMARATE 160-4.5 MCG/ACT IN AERO: 2.0000 | INHALATION_SPRAY | Freq: Two times a day (BID) | RESPIRATORY_TRACT | 5 refills | Status: DC

## 2019-10-25 MED ORDER — LEVALBUTEROL TARTRATE 45 MCG/ACT IN AERO: 2.0000 | INHALATION_SPRAY | Freq: Four times a day (QID) | RESPIRATORY_TRACT | 5 refills | Status: DC | PRN

## 2019-10-25 NOTE — Patient Instructions (Addendum)
1. Moderate persistent asthma, uncomplicated - Lung testing was around 75%, but it increased to over 100% with the Xopenex treatment. - This is highly suggestive of asthma, so we are going to treat you as asthmatic and see how you do. - Spacer sample and demonstration provided. - Daily controller medication(s): Symbicort 160/4.5mcg two puffs twice daily with spacer - Prior to physical activity: Xopenex 2 puffs 10-15 minutes before physical activity. - Rescue medications: Xopenex 4 puffs every 4-6 hours as needed - Asthma control goals:  * Full participation in all desired activities (may need albuterol before activity) * Albuterol use two time or less a week on average (not counting use with activity) * Cough interfering with sleep two time or less a month * Oral steroids no more than once a year * No hospitalizations  2. Chronic rhinitis - Testing today showed: negative to the entire panel - Start taking: Zyrtec (cetirizine) 10mg  tablet once daily and Flonase (fluticasone) one spray per nostril daily - You can use an extra dose of the antihistamine, if needed, for breakthrough symptoms.  - Consider nasal saline rinses 1-2 times daily to remove allergens from the nasal cavities as well as help with mucous clearance (this is especially helpful to do before the nasal sprays are given)  3. Return in about 4 weeks (around 11/22/2019). This can be an in-person, a virtual Webex or a telephone follow up visit.   Please inform us of any Emergency Department visits, hospitalizations, or changes in symptoms. Call us before going to the ED for breathing or allergy symptoms since we might be able to fit you in for a sick visit. Feel free to contact us anytime with any questions, problems, or concerns.  It was a pleasure to meet you today!  Websites that have reliable patient information: 1. American Academy of Asthma, Allergy, and Immunology: www.aaaai.org 2. Food Allergy Research and Education  (FARE): foodallergy.org 3. Mothers of Asthmatics: http://www.asthmacommunitynetwork.org 4. American College of Allergy, Asthma, and Immunology: www.acaai.org  "Like" [name] on Facebook and Instagram for our latest updates!      Make sure you are registered to vote! If you have moved or changed any of your contact information, you will need to get this updated before voting!  In some cases, you MAY be able to register to vote online: [email or web address]

## 2019-10-25 NOTE — Progress Notes (Signed)
NEW PATIENT  Date of Service/Encounter:  10/25/19  Referring provider: Kathyrn Drown, MD   Assessment:   Moderate persistent asthma, uncomplicated  Chronic rhinitis  Complicated past medical history, including fibromyalgia, anxiety and depression, chronic abdominal pain, seizures and pseudoseizures, drug abuse, and gastric bypass   Ms. Beitler presents for evaluation of shortness of breath.  On spirometry, she does have reversibility which is significant per ATS criteria.  She did feel better after the Xopenex treatment.  We will start her empirically on Symbicort 160/4.5 mcg 2 puffs twice daily to see if this helps.  It is difficult to gauge her overall response, as she has a flat affect and is difficult to read.  We did do environmental allergy testing that was completely nonreactive to the entirety of the testing.  We will get blood work to confirm this.  While she does not have a history of recurrent severe bacterial illnesses, she does report sinus infections fairly frequently so we will do an immunology screen.  I think in the end, while we might help her shortness of breath, she clearly has quite a few other medical problems which are likely to be handled better by other specialist involved in her care.   Plan/Recommendations:   1. Moderate persistent asthma, uncomplicated - Lung testing was around 75%, but it increased to over 100% with the Xopenex treatment. - This is highly suggestive of asthma, so we are going to treat you as asthmatic and see how you do. - Spacer sample and demonstration provided. - Daily controller medication(s): Symbicort 160/4.31mcg two puffs twice daily with spacer - Prior to physical activity: Xopenex 2 puffs 10-15 minutes before physical activity. - Rescue medications: Xopenex 4 puffs every 4-6 hours as needed - Asthma control goals:  * Full participation in all desired activities (may need albuterol before activity) * Albuterol use two time or  less a week on average (not counting use with activity) * Cough interfering with sleep two time or less a month * Oral steroids no more than once a year * No hospitalizations  2. Chronic rhinitis - Testing today showed: negative to the entire panel - Start taking: Zyrtec (cetirizine) 10mg  tablet once daily and Flonase (fluticasone) one spray per nostril daily - You can use an extra dose of the antihistamine, if needed, for breakthrough symptoms.  - Consider nasal saline rinses 1-2 times daily to remove allergens from the nasal cavities as well as help with mucous clearance (this is especially helpful to do before the nasal sprays are given)  3. Return in about 4 weeks (around 11/22/2019). This can be an in-person, a virtual Webex or a telephone follow up visit.   Subjective:   Penny [name] is a 33 y.o. female presenting today for evaluation of  Chief Complaint  Patient presents with  . Shortness of Breath    Cherree Lynnette Bence has a history of the following: Patient Active Problem List   Diagnosis Date Noted  . Dehydration   . Intractable abdominal pain 09/19/2019  . Intractable nausea and vomiting 09/19/2019  . Gastroesophageal reflux disease without esophagitis   . Cannabis abuse 08/30/2019  . Dyspnea   . Severe anxiety 08/21/2019  . Hematemesis with nausea 04/23/2019  . Absolute anemia 04/23/2019  . Urge incontinence 10/26/2018  . S/P kyphoplasty 08/02/2018  . Herpes genitalis in women 12/19/2017  . Abnormal urine odor 03/30/2017  . Urinary tract infection with hematuria 03/30/2017  . Screening for genitourinary condition 03/30/2017  .  Pain 03/30/2017  . Burning with urination 03/30/2017  . Family history of breast cancer in mother,uncertain BR CA status 02/28/2017  . Fibroid 01/18/2017  . Chronic pain syndrome 12/05/2016  . Folate deficiency 12/04/2016  . Nausea vomiting and diarrhea 12/04/2016  . Severe anemia 12/04/2016  . Symptomatic anemia  12/03/2016  . Complaints of leg weakness   . Paresthesia   . Left-sided weakness   . Uncontrolled pain 01/24/2016  . Malnutrition of moderate degree 01/24/2016  . Neuropathy 01/23/2016  . Inability to walk 01/23/2016  . Paresthesias   . Bilateral leg numbness 11/26/2015  . 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 Post partum depression 05/09/2015  . Anastomotic ulcer   . Dysphagia   . Hematemesis 04/07/2015  . Intractable vomiting with nausea 04/07/2015  . Elevated liver enzymes   . Epigastric pain   . Pseudoseizure ([place]) 02/22/2015  . Seizure-like activity ([place]) 02/22/2015  . Fibromyalgia 02/18/2015  . Vulvar fissure 02/18/2015  . Clinical depression 02/01/2015  . Current smoker 01/29/2015  . Current tobacco use 01/29/2015  . 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 Leg weakness, bilateral 01/02/2015  . Gait disorder 01/02/2015  . Other symptoms and signs involving the musculoskeletal system 01/02/2015  . Abnormal gait 01/02/2015  . Cervicalgia 12/18/2014  . Sciatica of left side 11/14/2014  . Neuralgia neuritis, sciatic nerve 11/14/2014  . 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 Encounter for sterilization 09/09/2014  . Abdominal pain, acute, right lower quadrant 08/22/2014  . Headache, migraine 08/19/2014  . Seizure ([place]) 08/18/2014  . Encephalopathy 08/13/2014  . Headache 08/13/2014  . Altered mental status 08/13/2014  . Right leg numbness 07/31/2014  . Disturbance of skin sensation 07/31/2014  . Hypertension in pregnancy, transient 07/08/2014  . Previous gastric bypass affecting pregnancy, antepartum 05/22/2014  . 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 ASD (atrial septal defect), ostium secundum 05/17/2014  . Depression complicating pregnancy in second trimester, antepartum 05/09/2014  . Antepartum mental disorder in pregnancy 05/09/2014  . Rapid palpitations 02/18/2014  . H/O  maternal third degree perineal laceration, currently pregnant 02/18/2014  . High-risk pregnancy 02/18/2014  . Supervision of pregnancy with other poor reproductive or obstetric history, unspecified trimester 02/18/2014  . Restless legs 01/16/2014  . Restless leg 01/16/2014  . Chronic interstitial cystitis 10/23/2013  . Menorrhagia 07/18/2013  . Excessive and frequent menstruation 07/18/2013  . h/o Opiate addiction 03/11/2012    Class: Acute  . History of migraine headaches 03/10/2012    Class: Acute  . Depression with anxiety 03/10/2012    Class: Chronic  . 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 Panic disorder without agoraphobia 03/10/2012  . H/O disease 03/10/2012  . Dysthymia 03/10/2012  . Pelvic congestion syndrome 10/13/2011  . Coitalgia 10/13/2011  . Chronic migraine without aura 10/13/2011  . Unspecified dyspareunia 10/13/2011  . IBS (irritable bowel syndrome) 08/25/2011  . Diarrhea 05/27/2011  . OBESITY, UNSPECIFIED 09/17/2010  . Transaminitis 09/17/2010  . IDA (iron deficiency anemia) 07/23/2010    History obtained from: chart review and patient.  Clifton James Jaimes was referred by Kathyrn Drown, MD.     Taline is a 33 y.o. female presenting for an evaluation of shortness of breath.  Her history is rather difficult to ascertain, as she is a poor historian.  She tells  me that she started having problems with shortness of breath in September.  Around the same time, she also started having some gastroenterology issues including chronic abdominal pain.  Evidently she had a stent placed at one point but this was removed.   She started having problems with breathing in September. She also started having some GI symptoms around the same time. She is scheduled for an ERCP. She has been hospitalized in September 2020 after her fifth  ER visits for abdominal pain.  During the hospitalization, she had an MRCP which was negative.  A CT of her abdomen and pelvis was negative.  A right upper quadrant ultrasound was negative.  She received Phenergan and was started on a PPI as well as as needed Dilaudid.  Evidently the gastroenterologist (Dr. Laural Golden) felt that she had sphincter of Oddi dysfunction, which was causing her abdominal plain.  She is scheduled for an ERCP at [facility] next month.  Regarding the shortness of breath, she has been placed on albuterol nebulizer with relief. She is using it upwards of 3-4 times per day. She has not been placed on any controller medication for asthma and has never seen Pulmonology. She never had problems with asthma as a child. She denies ED visits for her breathing, although as mentioned above she does have several ED visits for her abdominal pain. She has never done any kind of breathing test at all.   Her allergic rhinitis history is rather vague. She does report some sneezing and ocular itching occasionally. She has never been allergy tested at all. She does not use a nose spray at all.    She was diagnosed with pneumonia last weekend and completed a course of antibiotics. This was felt to be secondary to aspiration from her recent hospitalization. She has otherwise not required too many courses of antibiotics, although again her history is extremely vague. We will do an immune evaluation today just to assess her immune function.   She has a history of anxiety, depression, and ADHD. She is on multiple controlled medications for this and is followed by [facility].   Otherwise, there is no history of other atopic diseases, including food allergies, stinging insect allergies, eczema, urticaria or contact dermatitis. There is no significant infectious history. Vaccinations are up to date.    Past Medical History: Patient Active Problem List   Diagnosis Date Noted  . Dehydration   . Intractable  abdominal pain 09/19/2019  . Intractable nausea and vomiting 09/19/2019  . Gastroesophageal reflux disease without esophagitis   . Cannabis abuse 08/30/2019  . Dyspnea   . Severe anxiety 08/21/2019  . Hematemesis with nausea 04/23/2019  . Absolute anemia 04/23/2019  . Urge incontinence 10/26/2018  . S/P kyphoplasty 08/02/2018  . Herpes genitalis in women 12/19/2017  . Abnormal urine odor 03/30/2017  . Urinary tract infection with hematuria 03/30/2017  . Screening for genitourinary condition 03/30/2017  . Pain 03/30/2017  . Burning with urination 03/30/2017  . Family history of breast cancer in mother,uncertain BR CA status 02/28/2017  . Fibroid 01/18/2017  . Chronic pain syndrome 12/05/2016  . Folate deficiency 12/04/2016  . Nausea vomiting and diarrhea 12/04/2016  . Severe anemia 12/04/2016  . Symptomatic anemia 12/03/2016  . Complaints of leg weakness   . Paresthesia   . Left-sided weakness   . Uncontrolled pain 01/24/2016  . Malnutrition of moderate degree 01/24/2016  . Neuropathy 01/23/2016  . Inability to walk 01/23/2016  . Paresthesias   . Bilateral leg numbness  11/26/2015  . 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 Post partum depression 05/09/2015  . Anastomotic ulcer   . Dysphagia   . Hematemesis 04/07/2015  . Intractable vomiting with nausea 04/07/2015  . Elevated liver enzymes   . Epigastric pain   . Pseudoseizure ([place]) 02/22/2015  . Seizure-like activity ([place]) 02/22/2015  . Fibromyalgia 02/18/2015  . Vulvar fissure 02/18/2015  . Clinical depression 02/01/2015  . Current smoker 01/29/2015  . Current tobacco use 01/29/2015  . 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 Leg weakness, bilateral 01/02/2015  . Gait disorder 01/02/2015  . Other symptoms and signs involving the musculoskeletal system 01/02/2015  . Abnormal gait 01/02/2015  . Cervicalgia 12/18/2014  . Sciatica of left side  11/14/2014  . Neuralgia neuritis, sciatic nerve 11/14/2014  . 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 Encounter for sterilization 09/09/2014  . Abdominal pain, acute, right lower quadrant 08/22/2014  . Headache, migraine 08/19/2014  . Seizure ([place]) 08/18/2014  . Encephalopathy 08/13/2014  . Headache 08/13/2014  . Altered mental status 08/13/2014  . Right leg numbness 07/31/2014  . Disturbance of skin sensation 07/31/2014  . Hypertension in pregnancy, transient 07/08/2014  . Previous gastric bypass affecting pregnancy, antepartum 05/22/2014  . 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 ASD (atrial septal defect), ostium secundum 05/17/2014  . Depression complicating pregnancy in second trimester, antepartum 05/09/2014  . Antepartum mental disorder in pregnancy 05/09/2014  . Rapid palpitations 02/18/2014  . H/O maternal third degree perineal laceration, currently pregnant 02/18/2014  . High-risk pregnancy 02/18/2014  . Supervision of pregnancy with other poor reproductive or obstetric history, unspecified trimester 02/18/2014  . Restless legs 01/16/2014  . Restless leg 01/16/2014  . Chronic interstitial cystitis 10/23/2013  . Menorrhagia 07/18/2013  . Excessive and frequent menstruation 07/18/2013  . h/o Opiate addiction 03/11/2012    Class: Acute  . History of migraine headaches 03/10/2012    Class: Acute  . Depression with anxiety 03/10/2012    Class: Chronic  . 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 Panic disorder without agoraphobia 03/10/2012  . H/O disease 03/10/2012  . Dysthymia 03/10/2012  . Pelvic congestion syndrome 10/13/2011  . Coitalgia 10/13/2011  . Chronic migraine without aura 10/13/2011  . Unspecified dyspareunia 10/13/2011  . IBS (irritable bowel syndrome) 08/25/2011  . Diarrhea 05/27/2011  . OBESITY,  UNSPECIFIED 09/17/2010  . Transaminitis 09/17/2010  . IDA (iron deficiency anemia) 07/23/2010    Medication List:  Allergies as of 10/25/2019      Reactions   Gabapentin Other (See Comments)   Pt states that she was unresponsive after taking this medication, but her vitals remained stable.     Metoclopramide Hcl Anxiety, Other (See Comments)   Pt states that she felt like she was trapped in a box, and could not get out.  Pt also states that she had temporary loss of movement, weakness, and tingling.     Tramadol Other (See Comments)   Seizures   Ativan [lorazepam] Other (See Comments)   combative   Latex Itching, Rash, Other (See Comments)   Burns   Lyrica [pregabalin] Other (See Comments)   suicidal   Trazodone And Nefazodone Other (See Comments)   Makes pt "like a zombie"   Zofran [ondansetron] Other (See Comments)   Migraines in high doses    Sulfa  Antibiotics Hives   Butrans [buprenorphine] Rash   Patch caused rash, didn't help with pain   Nucynta [tapentadol] Nausea And Vomiting   Propofol Nausea And Vomiting   Tape Itching, Rash, Other (See Comments)   Please use paper tape   Toradol [ketorolac Tromethamine] Anxiety      Medication List       Accurate as of October 25, 2019 11:59 PM. If you have any questions, ask your nurse or doctor.        STOP taking these medications   amoxicillin 500 MG capsule Commonly known as: AMOXIL Stopped by: Valentina Shaggy, MD     TAKE these medications   albuterol 108 (90 Base) MCG/ACT inhaler Commonly known as: VENTOLIN HFA Inhale 2 puffs into the lungs every 6 (six) hours as needed for wheezing or shortness of breath.   albuterol (2.5 MG/3ML) 0.083% nebulizer solution Commonly known as: PROVENTIL Take 3 mLs (2.5 mg total) by nebulization every 4 (four) hours as needed for wheezing.   amphetamine-dextroamphetamine 30 MG 24 hr capsule Commonly known as: Adderall XR TAKE (1) CAPSULE BY MOUTH EVERY DAY.    amphetamine-dextroamphetamine 30 MG 24 hr capsule Commonly known as: Adderall XR Take 1 capsule (30 mg total) by mouth daily.   Belsomra 20 MG Tabs Generic drug: Suvorexant Take 20 mg by mouth at bedtime.   budesonide-formoterol 160-4.5 MCG/ACT inhaler Commonly known as: Symbicort Inhale 2 puffs into the lungs 2 (two) times daily. Started by: Valentina Shaggy, MD   cetirizine 10 MG tablet Commonly known as: ZYRTEC Take 1 tablet (10 mg total) by mouth daily. Started by: Valentina Shaggy, MD   clonazePAM 0.5 MG tablet Commonly known as: KlonoPIN Take 1 tablet (0.5 mg total) by mouth 3 (three) times daily as needed for anxiety.   dicyclomine 10 MG capsule Commonly known as: BENTYL Take by mouth.   DULoxetine 60 MG capsule Commonly known as: CYMBALTA Take 1 capsule (60 mg total) by mouth 2 (two) times daily.   HYDROmorphone 2 MG tablet Commonly known as: Dilaudid Take 1-2 tablets po q 4 hrs prn pain no greater than 9 tablets/day What changed: Another medication with the same name was removed. Continue taking this medication, and follow the directions you see here. Changed by: Valentina Shaggy, MD   levalbuterol 45 MCG/ACT inhaler Commonly known as: Xopenex HFA Inhale 2 puffs into the lungs every 6 (six) hours as needed for wheezing. Started by: Valentina Shaggy, MD   ondansetron 4 MG disintegrating tablet Commonly known as: ZOFRAN-ODT Take 1 tablet (4 mg total) by mouth every 8 (eight) hours as needed for nausea or vomiting.   pantoprazole 40 MG tablet Commonly known as: PROTONIX Take 1 tablet (40 mg total) by mouth 2 (two) times daily before a meal.   sucralfate 1 GM/10ML suspension Commonly known as: CARAFATE Take 1 g by mouth 4 (four) times daily.   topiramate 50 MG tablet Commonly known as: TOPAMAX Take 2 tablets (100 mg total) by mouth 2 (two) times daily.       Birth History: non-contributory  Developmental History: non-contributory   Past Surgical History: Past Surgical History:  Procedure Laterality Date  . BIOPSY  04/10/2015   Procedure: BIOPSY;  Surgeon: Daneil Dolin, MD;  Location: [facility];  Service: Endoscopy;;  . cath self every nite     for sodium bicarb injection (discontinued 2013)  . CHOLECYSTECTOMY  2005   biliary dyskinesia  . COLONOSCOPY  JUN 2012 ABD  PN/DIARRHEA WITH PROPOFOL   NL COLON  . DILATION AND CURETTAGE OF UTERUS    . DILITATION & CURRETTAGE/HYSTROSCOPY WITH NOVASURE ABLATION N/A 03/24/2017   Procedure: DILATATION & CURETTAGE/HYSTEROSCOPY WITH NOVASURE ENDOMETRIAL ABLATION;  Surgeon: Jonnie Kind, MD;  Location: [facility];  Service: Gynecology;  Laterality: N/A;  . ESOPHAGEAL DILATION N/A 04/10/2015   Procedure: ESOPHAGEAL DILATION WITH 54FR MALONEY DILATOR;  Surgeon: Daneil Dolin, MD;  Location: [facility];  Service: Endoscopy;  Laterality: N/A;  . ESOPHAGOGASTRODUODENOSCOPY    . ESOPHAGOGASTRODUODENOSCOPY (EGD) WITH PROPOFOL N/A 04/10/2015   Procedure: ESOPHAGOGASTRODUODENOSCOPY (EGD) WITH PROPOFOL;  Surgeon: Daneil Dolin, MD;  Location: [facility];  Service: Endoscopy;  Laterality: N/A;  . ESOPHAGOGASTRODUODENOSCOPY (EGD) WITH PROPOFOL N/A 12/06/2016   Procedure: ESOPHAGOGASTRODUODENOSCOPY (EGD) WITH PROPOFOL;  Surgeon: Danie Binder, MD;  [facility];  Service: Endoscopy;  Laterality: N/A;  . ESOPHAGOGASTRODUODENOSCOPY (EGD) WITH PROPOFOL N/A 04/27/2019   Procedure: ESOPHAGOGASTRODUODENOSCOPY (EGD) WITH PROPOFOL;  Surgeon: Rogene Houston, MD;  [facility];  Service: Endoscopy;  Laterality: N/A;  730  . ESOPHAGOGASTRODUODENOSCOPY (EGD) WITH PROPOFOL N/A 08/31/2019   Procedure: ESOPHAGOGASTRODUODENOSCOPY (EGD) WITH PROPOFOL;  Surgeon: Rogene Houston, MD;  [facility]: [facility];  Service: Endoscopy;  Laterality: N/A;  . GAB  2007   in [place]-POUCH 5 CM  . GASTRIC BYPASS  06/2006  . HYSTEROSCOPY W/D&C N/A 09/12/2014   Procedure: DILATATION AND CURETTAGE /HYSTEROSCOPY;   Surgeon: Jonnie Kind, MD;  Location: [facility];  Service: Gynecology;  Laterality: N/A;  . KYPHOPLASTY N/A 08/02/2018   Procedure: KYPHOPLASTY T12;  Surgeon: Melina Schools, MD;  Location: [place];  Service: Orthopedics;  Laterality: N/A;  60 mins  . LAPAROSCOPIC TUBAL LIGATION Bilateral 03/24/2017   Procedure: LAPAROSCOPIC TUBAL LIGATION ([place] Rings);  Surgeon: Jonnie Kind, MD;  Location: [facility];  Service: Gynecology;  Laterality: Bilateral;  . REPAIR VAGINAL CUFF N/A 07/30/2014   Procedure: REPAIR VAGINAL CUFF;  Surgeon: Mora Bellman, MD;  Location: [facility];  Service: Gynecology;  Laterality: N/A;  . SAVORY DILATION  06/20/2012   Dr. Barnie Alderman gastritis/Ulcer in the mid jejunum. Empiric dilation.   . SURAL NERVE BX Left 02/25/2016   Procedure: LEFT SURAL NERVE BIOPSY;  Surgeon: Jovita Gamma, MD;  Location: [place] [facility];  Service: Neurosurgery;  Laterality: Left;  Left sural nerve biopsy  . TONSILLECTOMY    . TONSILLECTOMY AND ADENOIDECTOMY    . UPPER GASTROINTESTINAL ENDOSCOPY  JULY 2011 NAUSEA-D125,V6, PH 25   Bx; GASTRITIS, POUCH-5 CM LONG  . WISDOM TOOTH EXTRACTION       Family History: Family History  Problem Relation Age of Onset  . Hemochromatosis Maternal Grandmother   . Migraines Maternal Grandmother   . Cancer Maternal Grandmother   . Breast cancer Maternal Grandmother   . Hypertension Father   . Diabetes Father   . Coronary artery disease Father   . Migraines Paternal Grandmother   . Breast cancer Paternal Grandmother   . Cancer Mother        breast  . Hemochromatosis Mother   . Breast cancer Mother   . Depression Mother   . Anxiety disorder Mother   . Coronary artery disease Paternal Grandfather   . Anxiety disorder Brother   . Bipolar disorder Brother   . Healthy Daughter   . Healthy Son      Social History: Hazle lives at home with her boyfriend as well their blended family. She does not work outside of the  home, but does take care of the children.  She uses mariajuana on a regular basis. She has smoked cigarettes as well. There are no dust mite coverings on the bedding.    Review of Systems  Constitutional: Negative.  Negative for chills, fever, malaise/fatigue and weight loss.  HENT: Negative.  Negative for congestion, ear discharge, ear pain and sore throat.   Eyes: Negative for pain, discharge and redness.  Respiratory: Positive for cough, shortness of breath and wheezing. Negative for sputum production.   Cardiovascular: Negative.  Negative for chest pain and palpitations.  Gastrointestinal: Positive for abdominal pain, constipation and nausea. Negative for diarrhea, heartburn and vomiting.  Skin: Negative.  Negative for itching and rash.  Neurological: Negative for dizziness and headaches.  Endo/Heme/Allergies: Negative for environmental allergies. Does not bruise/bleed easily.       Objective:   Blood pressure 122/86, pulse (!) 109, temperature 98 F (36.7 C), temperature source Temporal, resp. rate 18, height 5\' 6"  (1.676 m), weight 185 lb (83.9 kg), SpO2 97 %. Body mass index is 29.86 kg/m.   Physical Exam:   Physical Exam  Constitutional: She appears well-developed.  Flat affect. Not very interactive.   HENT:  Head: Normocephalic and atraumatic.  Right Ear: Tympanic membrane, external ear and ear canal normal. No drainage, swelling or tenderness. Tympanic membrane is not injected, not scarred, not erythematous, not retracted and not bulging.  Left Ear: Tympanic membrane, external ear and ear canal normal. No drainage, swelling or tenderness. Tympanic membrane is not injected, not scarred, not erythematous, not retracted and not bulging.  Nose: Mucosal edema present. No rhinorrhea, nasal deformity or septal deviation. No epistaxis. Right sinus exhibits no maxillary sinus tenderness and no frontal sinus tenderness. Left sinus exhibits no maxillary sinus tenderness and no frontal sinus tenderness.  Mouth/Throat: Uvula  is midline and oropharynx is clear and moist. Mucous membranes are not pale and not dry.  Tonsils unremarkable bilaterally.   Eyes: Pupils are equal, round, and reactive to light. Conjunctivae and EOM are normal. Right eye exhibits no chemosis and no discharge. Left eye exhibits no chemosis and no discharge. Right conjunctiva is not injected. Left conjunctiva is not injected.  Cardiovascular: Normal rate, regular rhythm and normal heart sounds.  Respiratory: Effort normal and breath sounds normal. No accessory muscle usage. No tachypnea. No respiratory distress. She has no wheezes. She has no rhonchi. She has no rales. She exhibits no tenderness.  Shallow breathing. There is some end expiratory wheezing noted in the bilateral bases. There are no crackles or wheezes noted at all.   GI: There is no abdominal tenderness. There is no rebound and no guarding.  Lymphadenopathy:       Head (right side): No submandibular, no tonsillar and no occipital adenopathy present.       Head (left side): No submandibular, no tonsillar and no occipital adenopathy present.    She has no cervical adenopathy.  Neurological: She is alert.  Skin: No abrasion, no petechiae and no rash noted. Rash is not papular, not vesicular and not urticarial. No erythema. No pallor.  Psychiatric: She has a normal mood and affect.     Diagnostic studies:    Spirometry: results abnormal (FEV1: 2.49/74%, FVC: 3.08/76%, FEV1/FVC: 81%).    Spirometry consistent with possible restrictive disease. Xopenex MDI treatment given in clinic with significant improvement in FEV1 and FVC per ATS criteria.  Allergy Studies:    Airborne Adult Perc - 10/25/19 1556    Time Antigen Placed  [place]    Location  Back    Number of Test  59    Panel 1  Select    1. Control-Buffer 50% Glycerol  Negative    2. Control-Histamine 1 mg/ml  2+    3. Albumin saline  Negative    4. Lenoir  Negative    5. [place]  Negative     6. Johnson  Negative    7. Hubbard Blue  Negative    8. Meadow Fescue  Negative    9. Perennial Rye  Negative    10. Sweet Vernal  Negative    11. Timothy  Negative    12. Cocklebur  Negative    13. Burweed Marshelder  Negative    14. Ragweed, short  Negative    15. Ragweed, Giant  Negative    16. Plantain,  English  Negative    17. Lamb's Quarters  Negative    18. Sheep Sorrell  Negative    19. Rough Pigweed  Negative    20. Marsh Elder, Rough  Negative    21. Mugwort, Common  Negative    22. Ash mix  Negative    23. Birch mix  Negative    24. Beech American  Negative    25. Box, Elder  Negative    26. Cedar, red  Negative    27. Cottonwood, [place]  Negative    28. Elm mix  Negative    29. Hickory mix  Negative    30. Maple mix  Negative    31. Oak, [place] mix  Negative    32. Pecan Pollen  Negative    33. Pine mix  Negative    34. Sycamore Eastern  Negative    35. [place], Black Pollen  Negative    36. Alternaria alternata  Negative    37. Cladosporium Herbarum  Negative    38. Aspergillus mix  Negative    39. Penicillium mix  Negative    40. Bipolaris sorokiniana (Helminthosporium)  Negative    41. Drechslera spicifera (Curvularia)  Negative    42. Mucor plumbeus  Negative    43. Fusarium moniliforme  Negative    44. Aureobasidium pullulans (pullulara)  Negative    45. Rhizopus oryzae  Negative    46. Botrytis cinera  Negative    47. Epicoccum nigrum  Negative    48. Phoma betae  Negative    49. Candida Albicans  Negative    50. Trichophyton mentagrophytes  Negative    51. Mite, D Farinae  5,000 AU/ml  Negative    52. Mite, D Pteronyssinus  5,000 AU/ml  Negative    53. Cat Hair 10,000 BAU/ml  Negative    54.  Dog Epithelia  Negative    55. Mixed Feathers  Negative    56. Horse Epithelia  Negative    57. Cockroach, German  Negative    58. Mouse  Negative    59. Tobacco Leaf  Negative     Food Perc - 10/25/19 1556    Time Antigen Placed  1540    Allergen  Manufacturer  Lavella Hammock    Location  Back    Number of allergen test  10    Food  Select    1. Peanut  Negative    2. Soybean food  Negative    3. Wheat, whole  Negative    4. Sesame  Negative    5. Milk, cow  Negative  6. Egg White, chicken  Negative    7. Casein  Negative    8. Shellfish mix  Negative    9. Fish mix  Negative    10. Cashew  Negative       Allergy testing results were read and interpreted by myself, documented by clinical staff.         Salvatore Marvel, MD [facility]

## 2019-10-26 ENCOUNTER — Encounter: Payer: Self-pay | Admitting: Allergy & Immunology

## 2019-10-29 DIAGNOSIS — M5441 Lumbago with sciatica, right side: Secondary | ICD-10-CM | POA: Diagnosis not present

## 2019-10-29 DIAGNOSIS — M62830 Muscle spasm of back: Secondary | ICD-10-CM | POA: Diagnosis not present

## 2019-10-29 DIAGNOSIS — M5442 Lumbago with sciatica, left side: Secondary | ICD-10-CM | POA: Diagnosis not present

## 2019-10-29 DIAGNOSIS — M129 Arthropathy, unspecified: Secondary | ICD-10-CM | POA: Diagnosis not present

## 2019-10-29 DIAGNOSIS — Z79891 Long term (current) use of opiate analgesic: Secondary | ICD-10-CM | POA: Diagnosis not present

## 2019-10-29 DIAGNOSIS — G8929 Other chronic pain: Secondary | ICD-10-CM | POA: Diagnosis not present

## 2019-10-29 LAB — CBC WITH DIFFERENTIAL/PLATELET
Basophils Absolute: 0 10*3/uL (ref 0.0–0.2)
Basos: 1 %
EOS (ABSOLUTE): 0.3 10*3/uL (ref 0.0–0.4)
Eos: 5 %
Hematocrit: 43 % (ref 34.0–46.6)
Hemoglobin: 14.4 g/dL (ref 11.1–15.9)
Immature Grans (Abs): 0 10*3/uL (ref 0.0–0.1)
Immature Granulocytes: 0 %
Lymphocytes Absolute: 1.4 10*3/uL (ref 0.7–3.1)
Lymphs: 27 %
MCH: 31.4 pg (ref 26.6–33.0)
MCHC: 33.5 g/dL (ref 31.5–35.7)
MCV: 94 fL (ref 79–97)
Monocytes Absolute: 0.5 10*3/uL (ref 0.1–0.9)
Monocytes: 9 %
Neutrophils Absolute: 2.9 10*3/uL (ref 1.4–7.0)
Neutrophils: 58 %
Platelets: 339 10*3/uL (ref 150–450)
RBC: 4.59 x10E6/uL (ref 3.77–5.28)
RDW: 11.4 % — ABNORMAL LOW (ref 11.7–15.4)
WBC: 5 10*3/uL (ref 3.4–10.8)

## 2019-10-29 LAB — IGE+ALLERGENS ZONE 2(30)
Alternaria Alternata IgE: <0.1 kU/L
Amer Sycamore IgE Qn: 0.2 kU/L — AB
Aspergillus Fumigatus IgE: <0.1 kU/L
Bahia Grass IgE: 0.28 kU/L — AB
Bermuda Grass IgE: 0.24 kU/L — AB
Cat Dander IgE: <0.1 kU/L
Cedar, Mountain IgE: 0.19 kU/L — AB
Cladosporium Herbarum IgE: <0.1 kU/L
Cockroach, American IgE: 0.11 kU/L — AB
Common Silver Birch IgE: 0.16 kU/L — AB
D Farinae IgE: <0.1 kU/L
D Pteronyssinus IgE: <0.1 kU/L
Dog Dander IgE: <0.1 kU/L
Elm, American IgE: 0.22 kU/L — AB
Hickory, White IgE: 0.18 kU/L — AB
IgE (Immunoglobulin E), Serum: 196 IU/mL (ref 6–495)
Johnson Grass IgE: 0.29 kU/L — AB
Maple/Box Elder IgE: 0.21 kU/L — AB
Mucor Racemosus IgE: <0.1 kU/L
Mugwort IgE Qn: 0.19 kU/L — AB
Nettle IgE: 0.18 kU/L — AB
Oak, White IgE: 0.2 kU/L — AB
Penicillium Chrysogen IgE: <0.1 kU/L
Pigweed, Rough IgE: 0.18 kU/L — AB
Plantain, English IgE: 0.21 kU/L — AB
Ragweed, Short IgE: 0.27 kU/L — AB
Sheep Sorrel IgE Qn: 0.22 kU/L — AB
Stemphylium Herbarum IgE: <0.1 kU/L
Sweet gum IgE RAST Ql: 0.16 kU/L — AB
Timothy Grass IgE: 0.38 kU/L — AB
White Mulberry IgE: 0.15 kU/L — AB

## 2019-10-29 LAB — IGG, IGA, IGM
IgA/Immunoglobulin A, Serum: 188 mg/dL (ref 87–352)
IgG (Immunoglobin G), Serum: 1044 mg/dL (ref 586–1602)
IgM (Immunoglobulin M), Srm: 201 mg/dL (ref 26–217)

## 2019-10-29 LAB — ASPERGILLUS PRECIPITINS
A.Fumigatus #1 Abs: NEGATIVE
Aspergillus Flavus Antibodies: NEGATIVE
Aspergillus Niger Antibodies: NEGATIVE
Aspergillus glaucus IgG: NEGATIVE
Aspergillus nidulans IgG: NEGATIVE
Aspergillus terreus IgG: NEGATIVE

## 2019-10-29 LAB — ALPHA-1-ANTITRYPSIN: A-1 Antitrypsin: 194 mg/dL — ABNORMAL HIGH (ref 100–188)

## 2019-10-31 IMAGING — DX DG ABDOMEN ACUTE W/ 1V CHEST
4 series · 4 of 4 positions shown · non-contrast
Comparison: None.

CLINICAL DATA: Shortness of breath

EXAM:
DG ABDOMEN ACUTE W/ 1V CHEST

[chest pa]
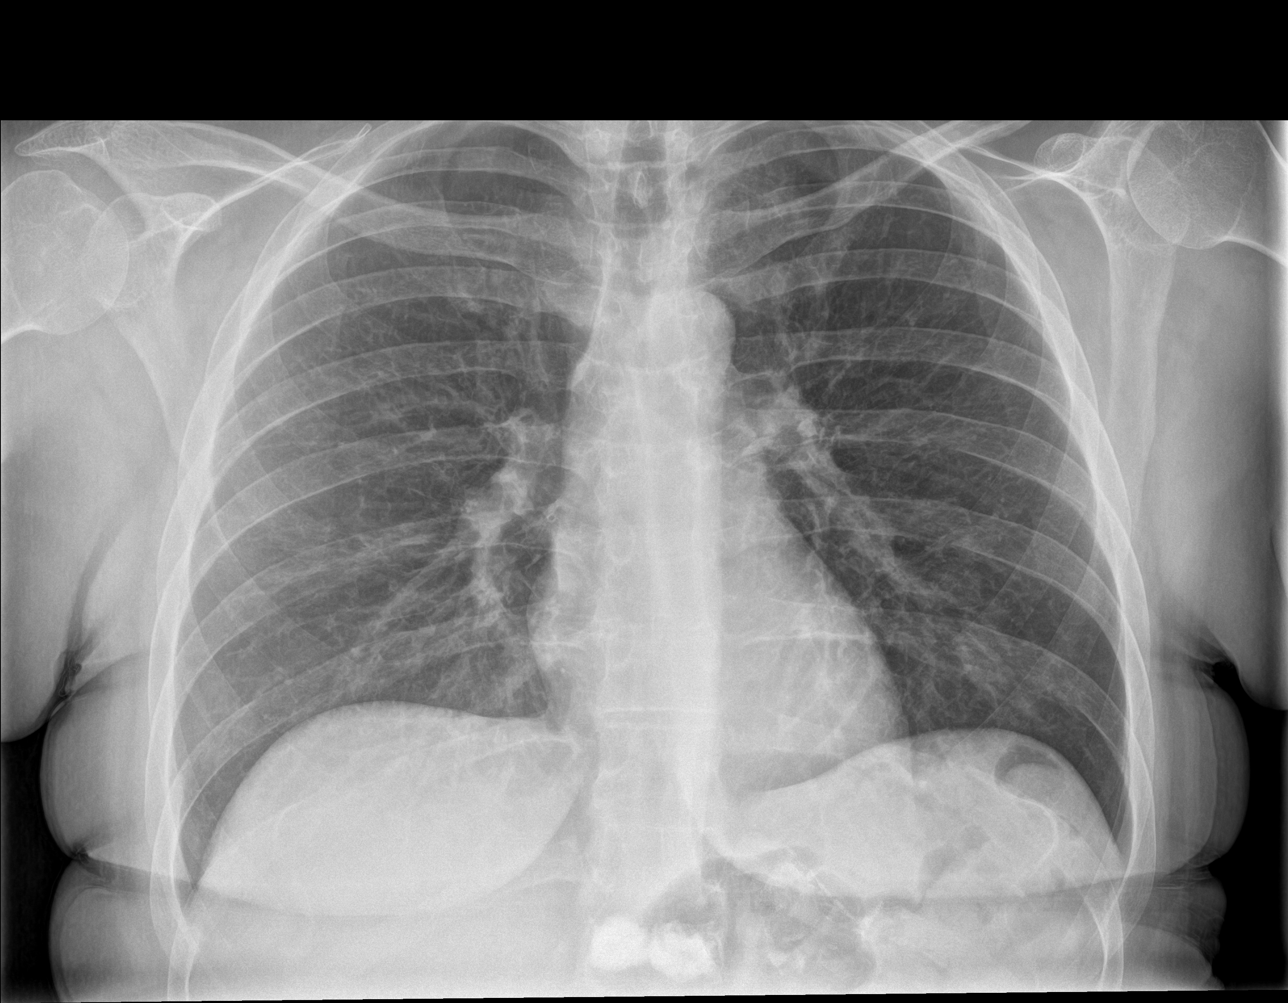

[abdomen erect]
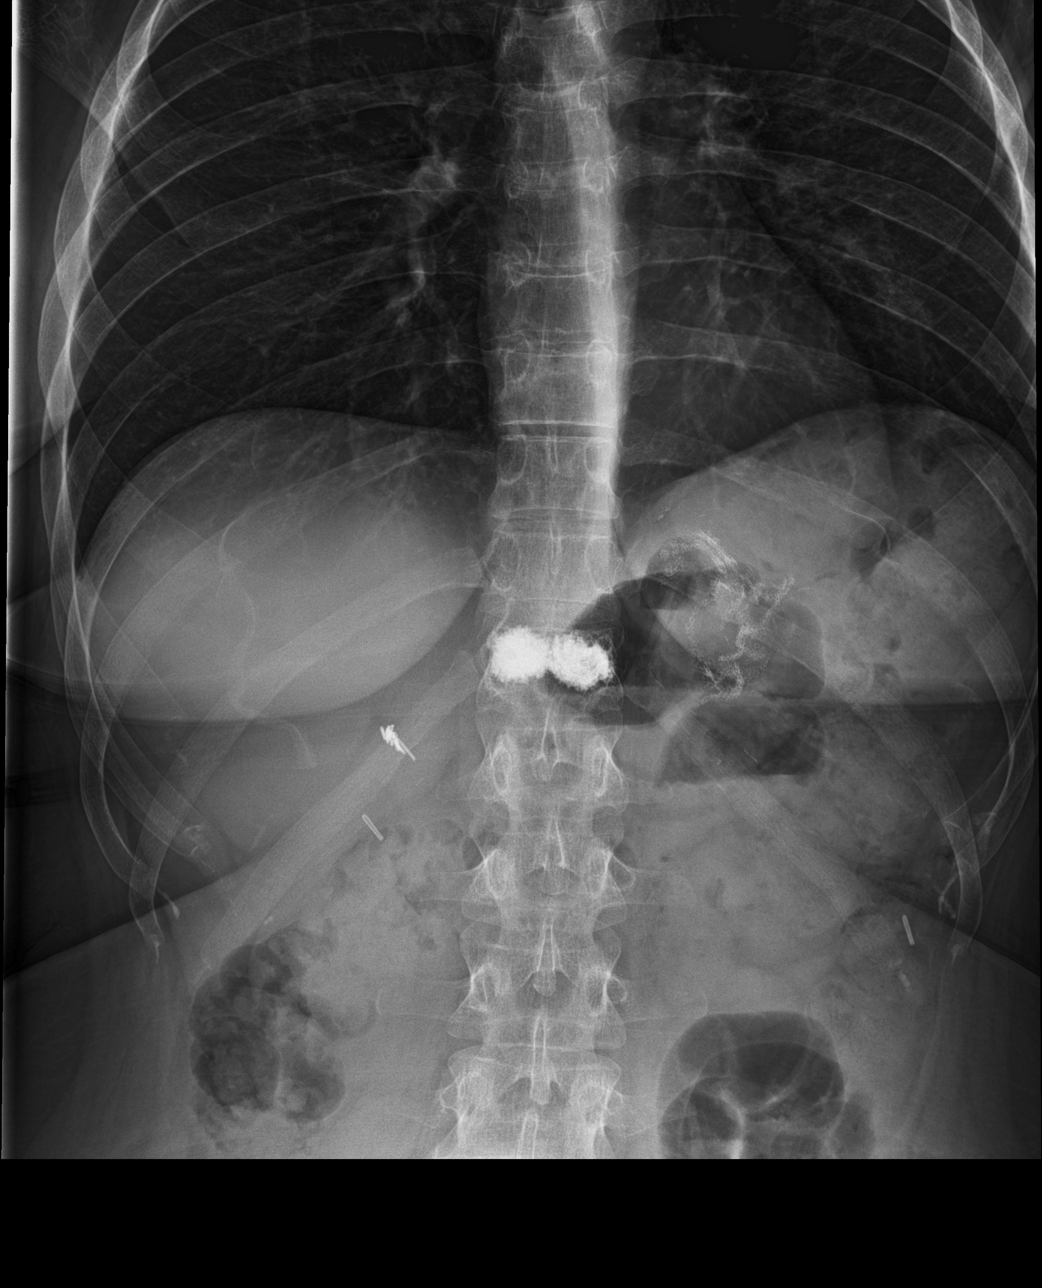

[abdomen supine (1 of 2)]
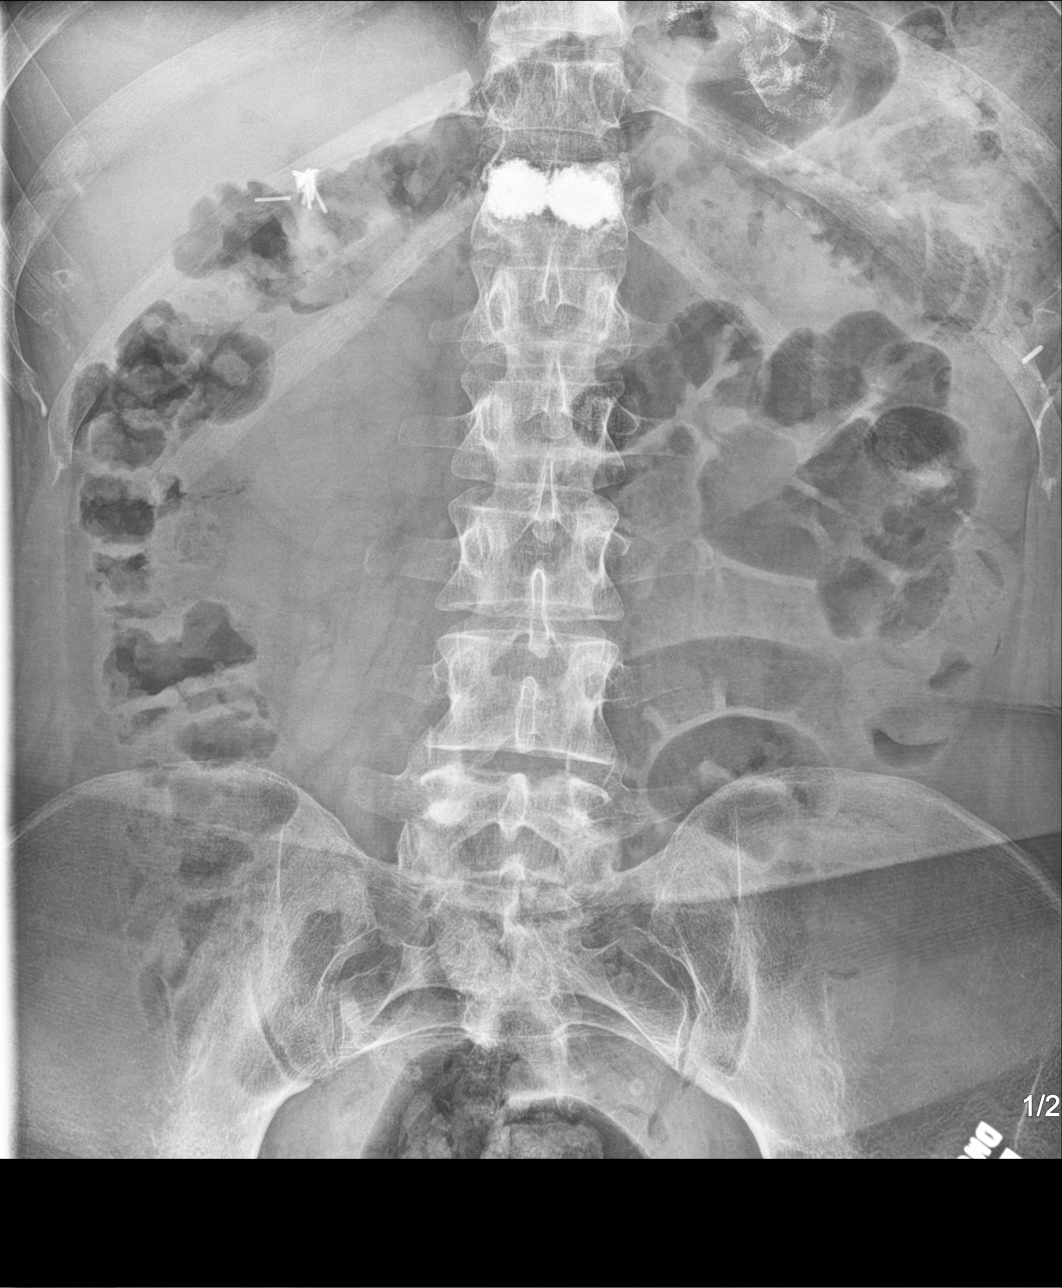

[abdomen supine (2 of 2)]
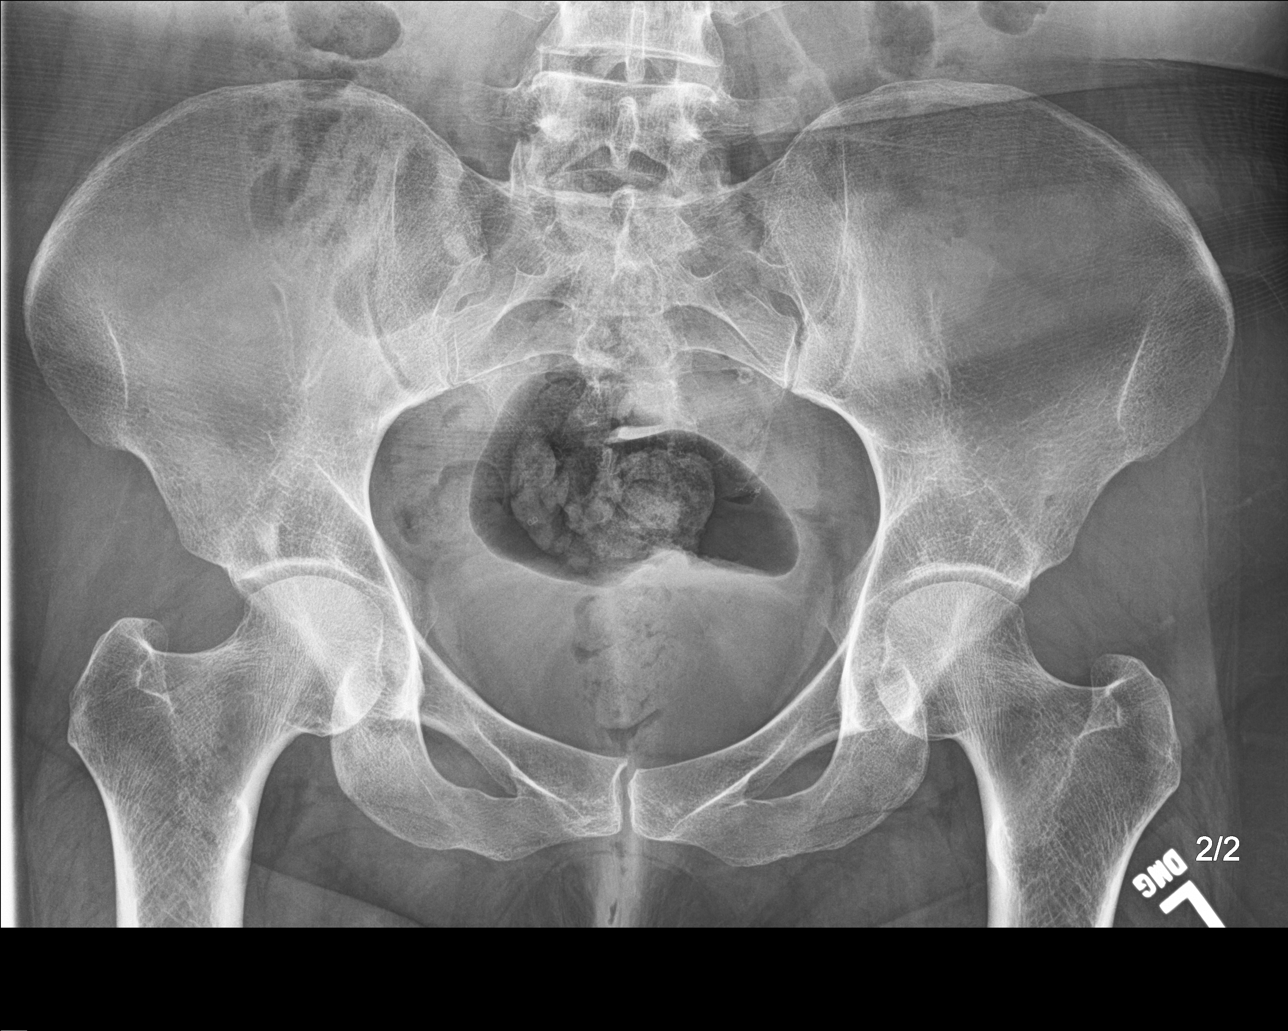

[4 of 4 positions shown; findings below may reference images not displayed]

FINDINGS: There is no evidence of dilated bowel loops or free intraperitoneal
air. No radiopaque calculi or other significant radiographic
abnormality is seen. Heart size and mediastinal contours are within
normal limits. Both lungs are clear. Is cement fixation seen at the
T12 vertebral body. Surgical clips seen the right and left mid
abdomen.
IMPRESSION: Nonobstructive bowel gas pattern.  No acute cardiopulmonary disease.

## 2019-11-14 ENCOUNTER — Encounter: Payer: Self-pay | Admitting: Allergy & Immunology

## 2019-11-14 ENCOUNTER — Other Ambulatory Visit: Payer: Self-pay

## 2019-11-14 ENCOUNTER — Ambulatory Visit (INDEPENDENT_AMBULATORY_CARE_PROVIDER_SITE_OTHER): Payer: Medicaid Other | Admitting: Allergy & Immunology

## 2019-11-14 VITALS — BP 92/64 | HR 100 | Temp 97.4°F | Resp 18 | Ht 66.0 in

## 2019-11-14 DIAGNOSIS — G43909 Migraine, unspecified, not intractable, without status migrainosus: Secondary | ICD-10-CM

## 2019-11-14 DIAGNOSIS — J4540 Moderate persistent asthma, uncomplicated: Secondary | ICD-10-CM | POA: Diagnosis not present

## 2019-11-14 DIAGNOSIS — J310 Chronic rhinitis: Secondary | ICD-10-CM | POA: Diagnosis not present

## 2019-11-14 MED ORDER — FLUTICASONE PROPIONATE 50 MCG/ACT NA SUSP: 1.0000 | Freq: Every day | NASAL | 2 refills | Status: DC | PRN

## 2019-11-14 MED ORDER — BUDESONIDE-FORMOTEROL FUMARATE 160-4.5 MCG/ACT IN AERO: 2.0000 | INHALATION_SPRAY | Freq: Two times a day (BID) | RESPIRATORY_TRACT | 5 refills | Status: DC

## 2019-11-14 MED ORDER — ALBUTEROL SULFATE HFA 108 (90 BASE) MCG/ACT IN AERS: 2.0000 | INHALATION_SPRAY | Freq: Four times a day (QID) | RESPIRATORY_TRACT | 1 refills | Status: DC | PRN

## 2019-11-14 NOTE — Progress Notes (Signed)
FOLLOW UP  Date of Service/Encounter:  11/15/19   Assessment:   Moderate persistent asthma, uncomplicated  Chronic rhinitis  Migraines - worsening in frequency and intensity  Complicated past medical history, including fibromyalgia, anxiety and depression, chronic abdominal pain, seizures and pseudoseizures, drug abuse, and gastric bypass    Penny [name] presents for follow-up visit.  From a breathing perspective, she is doing much better on the Symbicort.  She is using the albuterol every other day rather than multiple times a day.  She does think that the Symbicort is providing some relief.  Overall, she is much more lucid today.  Her allergic rhinitis seems to be controlled with the current regimen.  We could always consider initiating allergen immunotherapy in the future if indicated.  But for now, we will hold off on this.  She is reporting worsening migraines.  She did see a neurologist in the distant past, but is interested in another evaluation.  She is on Topamax as a maintenance prophylaxis, but would like something for acute attacks.  Unfortunately, she is going through some stress at home, as her husband is having neck surgery and will be out of work for 6 weeks.  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  Plan/Recommendations:   1. Moderate persistent asthma, uncomplicated - Lung testing did look much better.  - I think we are on the right track.  - we do have room to go up on your medications, if needed. - Daily controller medication(s): Symbicort 160/4.36mcg two puffs twice daily with spacer - Prior to physical activity: Xopenex 2 puffs 10-15 minutes before physical activity. - Rescue medications: Xopenex 4 puffs every 4-6 hours as needed - Asthma control goals:  * Full participation in all desired activities (may need albuterol before activity) * Albuterol use two time or less a week on average (not counting use with  activity) * Cough interfering with sleep two time or less a month * Oral steroids no more than once a year * No hospitalizations  2. Chronic rhinitis (grasses, cockroach, trees, ragweed, and trees) - Continue taking: Zyrtec (cetirizine) 10mg  tablet once daily and Flonase (fluticasone) one spray per nostril daily - You can use an extra dose of the antihistamine, if needed, for breakthrough symptoms.  - Consider nasal saline rinses 1-2 times daily to remove allergens from the nasal cavities as well as help with mucous clearance (this is especially helpful to do before the nasal sprays are given) - We will make sure that we get a Neurology referral to help with your migraine management.   3. Return in about 3 months (around 02/12/2020). This can be an in-person, a virtual Webex or a telephone follow up visit.   Subjective:   Penny [name] is a 33 y.o. female presenting today for follow up of  Chief Complaint  Patient presents with  . Asthma    Ben Lynnette Shoberg has a history of the following: Patient Active Problem List   Diagnosis Date Noted  . Dehydration   . Intractable abdominal pain 09/19/2019  . Intractable nausea and vomiting 09/19/2019  . Gastroesophageal reflux disease without esophagitis   . Cannabis abuse 08/30/2019  . Dyspnea   . Severe anxiety 08/21/2019  . Hematemesis with nausea 04/23/2019  . Absolute anemia 04/23/2019  . Urge incontinence 10/26/2018  . S/P kyphoplasty 08/02/2018  . Herpes genitalis in women 12/19/2017  . Abnormal urine odor 03/30/2017  . Urinary tract infection  with hematuria 03/30/2017  . Screening for genitourinary condition 03/30/2017  . Pain 03/30/2017  . Burning with urination 03/30/2017  . Family history of breast cancer in mother,uncertain BR CA status 02/28/2017  . Fibroid 01/18/2017  . Chronic pain syndrome 12/05/2016  . Folate deficiency 12/04/2016  . Nausea vomiting and diarrhea 12/04/2016  . Severe anemia 12/04/2016   . Symptomatic anemia 12/03/2016  . Complaints of leg weakness   . Paresthesia   . Left-sided weakness   . Uncontrolled pain 01/24/2016  . Malnutrition of moderate degree 01/24/2016  . Neuropathy 01/23/2016  . Inability to walk 01/23/2016  . Paresthesias   . Bilateral leg numbness 11/26/2015  . 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 Post partum depression 05/09/2015  . Anastomotic ulcer   . Dysphagia   . Hematemesis 04/07/2015  . Intractable vomiting with nausea 04/07/2015  . Elevated liver enzymes   . Epigastric pain   . Pseudoseizure ([place]) 02/22/2015  . Seizure-like activity ([place]) 02/22/2015  . Fibromyalgia 02/18/2015  . Vulvar fissure 02/18/2015  . Clinical depression 02/01/2015  . Current smoker 01/29/2015  . Current tobacco use 01/29/2015  . 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 Leg weakness, bilateral 01/02/2015  . Gait disorder 01/02/2015  . Other symptoms and signs involving the musculoskeletal system 01/02/2015  . Abnormal gait 01/02/2015  . Cervicalgia 12/18/2014  . Sciatica of left side 11/14/2014  . Neuralgia neuritis, sciatic nerve 11/14/2014  . 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 Encounter for sterilization 09/09/2014  . Abdominal pain, acute, right lower quadrant 08/22/2014  . Headache, migraine 08/19/2014  . Seizure ([place]) 08/18/2014  . Encephalopathy 08/13/2014  . Headache 08/13/2014  . Altered mental status 08/13/2014  . Right leg numbness 07/31/2014  . Disturbance of skin sensation 07/31/2014  . Hypertension in pregnancy, transient 07/08/2014  . Previous gastric bypass affecting pregnancy, antepartum 05/22/2014  . 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 ASD (atrial septal defect), ostium secundum 05/17/2014  . Depression complicating pregnancy in second trimester, antepartum 05/09/2014  . Antepartum mental disorder in pregnancy 05/09/2014  . Rapid  palpitations 02/18/2014  . H/O maternal third degree perineal laceration, currently pregnant 02/18/2014  . High-risk pregnancy 02/18/2014  . Supervision of pregnancy with other poor reproductive or obstetric history, unspecified trimester 02/18/2014  . Restless legs 01/16/2014  . Restless leg 01/16/2014  . Chronic interstitial cystitis 10/23/2013  . Menorrhagia 07/18/2013  . Excessive and frequent menstruation 07/18/2013  . h/o Opiate addiction 03/11/2012    Class: Acute  . History of migraine headaches 03/10/2012    Class: Acute  . Depression with anxiety 03/10/2012    Class: Chronic  . 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 Panic disorder without agoraphobia 03/10/2012  . H/O disease 03/10/2012  . Dysthymia 03/10/2012  . Pelvic congestion syndrome 10/13/2011  . Coitalgia 10/13/2011  . Chronic migraine without aura 10/13/2011  . Unspecified dyspareunia 10/13/2011  . IBS (irritable bowel syndrome) 08/25/2011  . Diarrhea 05/27/2011  . OBESITY, UNSPECIFIED 09/17/2010  . Transaminitis 09/17/2010  . IDA (iron deficiency anemia) 07/23/2010    History obtained from: chart review and patient.  Kolette is a 33 y.o. female presenting for a follow up visit. She was last seen in November 2020 for an evaluation of SOB. At that time, she did have reversibility, however her  effort was lackluster at best. We did decide to empirically start her on Symbicort two puffs BID. She had an environmental allergy test performed that was negative to the entire panel. We started her on cetirizine daily as well as Flonase.  We did labs to confirm that the environmental panel was indeed negative and she had positives to grasses, cockroach, trees, ragweed, and trees. We did a CBC which was normal and an immunoglobulin panel to screen for immunodeficiencies; this was  normal as well.   Since the last visit, she has done well.   Asthma/Respiratory Symptom History: She uses her rescue inhaler every other day. Overall she feels much better compared to when I saw her last time. She did have a lot of trouble over the weekend. She was not outdoors. She is unsure of why she was having problems.   Allergic Rhinitis Symptom History: She does endorse a history of chronic migraines. She did have a bad one this past weekend. She is on prophylactic Topamax for her migraines. She did see a Neurologist at one point when she was very young. She was on an injection at some point for her migraines. These did seem to help but she lost her insurance and could not longer get back on them. This was through pain management.   Otherwise, there have been no changes to her past medical history, surgical history, family history, or social history.    Review of Systems  Constitutional: Negative.  Negative for chills, fever, malaise/fatigue and weight loss.  HENT: Negative.  Negative for congestion, ear discharge, ear pain and sore throat.   Eyes: Negative for pain, discharge and redness.  Respiratory: Negative for cough, sputum production, shortness of breath and wheezing.   Cardiovascular: Negative.  Negative for chest pain and palpitations.  Gastrointestinal: Negative for abdominal pain, constipation, diarrhea, heartburn, nausea and vomiting.  Skin: Negative.  Negative for itching and rash.  Neurological: Negative for dizziness and headaches.  Endo/Heme/Allergies: Negative for environmental allergies. Does not bruise/bleed easily.       Objective:   Blood pressure 92/64, pulse 100, temperature (!) 97.4 F (36.3 C), temperature source Temporal, resp. rate 18, height 5\' 6"  (1.676 m), SpO2 93 %. Body mass index is 29.86 kg/m.   Physical Exam:  Physical Exam  Constitutional: She appears well-developed.  Much more interactive today.  HENT:  Head: Normocephalic and  atraumatic.  Right Ear: Tympanic membrane, external ear and ear canal normal.  Left Ear: Tympanic membrane and ear canal normal.  Nose: No mucosal edema, rhinorrhea, nasal deformity or septal deviation. No epistaxis. Right sinus exhibits no maxillary sinus tenderness and no frontal sinus tenderness. Left sinus exhibits no maxillary sinus tenderness and no frontal sinus tenderness.  Mouth/Throat: Uvula is midline and oropharynx is clear and moist. Mucous membranes are not pale and not dry.  Eyes: Pupils are equal, round, and reactive to light. Conjunctivae and EOM are normal. Right eye exhibits no chemosis and no discharge. Left eye exhibits no chemosis and no discharge. Right conjunctiva is not injected. Left conjunctiva is not injected.  Cardiovascular: Normal rate, regular rhythm and normal heart sounds.  Respiratory: Effort normal and breath sounds normal. No accessory muscle usage. No tachypnea. No respiratory distress. She has no wheezes. She has no rhonchi. She has no rales. She exhibits no tenderness.  Moving air well in all lung fields.  Lymphadenopathy:    She has no cervical adenopathy.  Neurological: She is alert.  Skin: No abrasion, no petechiae and  no rash noted. Rash is not papular, not vesicular and not urticarial. No erythema. No pallor.  Psychiatric: She has a normal mood and affect.     Diagnostic studies:    Spirometry: results normal (FEV1: 3.60/114%, FVC: 4.15/104%, FEV1/FVC: 86%).    Spirometry consistent with normal pattern.   Allergy Studies: none       Salvatore Marvel, MD  [facility]

## 2019-11-14 NOTE — Patient Instructions (Addendum)
1. Moderate persistent asthma, uncomplicated - Lung testing did look much better.  - I think we are on the right track.  - we do have room to go up on your medications, if needed. - Daily controller medication(s): Symbicort 160/4.23mcg two puffs twice daily with spacer - Prior to physical activity: Xopenex 2 puffs 10-15 minutes before physical activity. - Rescue medications: Xopenex 4 puffs every 4-6 hours as needed - Asthma control goals:  * Full participation in all desired activities (may need albuterol before activity) * Albuterol use two time or less a week on average (not counting use with activity) * Cough interfering with sleep two time or less a month * Oral steroids no more than once a year * No hospitalizations  2. Chronic rhinitis (grasses, cockroach, trees, ragweed, and trees) - Continue taking: Zyrtec (cetirizine) 10mg  tablet once daily and Flonase (fluticasone) one spray per nostril daily - You can use an extra dose of the antihistamine, if needed, for breakthrough symptoms.  - Consider nasal saline rinses 1-2 times daily to remove allergens from the nasal cavities as well as help with mucous clearance (this is especially helpful to do before the nasal sprays are given) - We will make sure that we get a Neurology referral to help with your migraine management.   3. Return in about 3 months (around 02/12/2020). This can be an in-person, a virtual Webex or a telephone follow up visit.   Please inform us of any Emergency Department visits, hospitalizations, or changes in symptoms. Call us before going to the ED for breathing or allergy symptoms since we might be able to fit you in for a sick visit. Feel free to contact us anytime with any questions, problems, or concerns.  It was a pleasure to meet you today!  Websites that have reliable patient information: 1. American Academy of Asthma, Allergy, and Immunology: www.aaaai.org 2. Food Allergy Research and Education (FARE):  foodallergy.org 3. Mothers of Asthmatics: http://www.asthmacommunitynetwork.org 4. American College of Allergy, Asthma, and Immunology: www.acaai.org  "Like" [name] on Facebook and Instagram for our latest updates!      Make sure you are registered to vote! If you have moved or changed any of your contact information, you will need to get this updated before voting!  In some cases, you MAY be able to register to vote online: [email or web address]

## 2019-11-15 ENCOUNTER — Encounter: Payer: Self-pay | Admitting: Allergy & Immunology

## 2019-11-16 NOTE — Progress Notes (Signed)
I routed message to dr Nicki Reaper to reveiw

## 2019-11-16 NOTE — Progress Notes (Signed)
Hey I think PCP will need to get involved with this one. I have had issues with referring patients with Medicaid to Neurology because we are not the PCP.   Penny [name] is there anyway Penny [name] can refer the patient to neuro for Migraines? Please let me know if I can be of any help.  Thanks

## 2019-11-17 NOTE — Progress Notes (Signed)
Nurses I am fine with referring her to neurology for migraines.  Find out from patient if she would like to be seen with [facility] or [facility] or neurology group of her choice

## 2019-11-19 ENCOUNTER — Encounter: Payer: Self-pay | Admitting: Neurology

## 2019-11-19 ENCOUNTER — Other Ambulatory Visit: Payer: Self-pay | Admitting: Family Medicine

## 2019-11-19 DIAGNOSIS — G43709 Chronic migraine without aura, not intractable, without status migrainosus: Secondary | ICD-10-CM

## 2019-11-19 NOTE — Progress Notes (Signed)
Pt contacted and would like to see [facility] for migraines. Pt would also like refill on pantoprazole and phenergan sent to [facility]. Please advise. Thank you

## 2019-11-19 NOTE — Addendum Note (Signed)
Addended by: Vicente Males on: 11/19/2019 02:44 PM   Modules accepted: Orders

## 2019-11-19 NOTE — Progress Notes (Signed)
May have referral to specialist as requested for migraines May have 6 refills on each

## 2019-11-20 ENCOUNTER — Other Ambulatory Visit: Payer: Self-pay | Admitting: Family Medicine

## 2019-11-20 ENCOUNTER — Other Ambulatory Visit: Payer: Self-pay | Admitting: *Deleted

## 2019-11-20 DIAGNOSIS — K219 Gastro-esophageal reflux disease without esophagitis: Secondary | ICD-10-CM

## 2019-11-20 MED ORDER — PANTOPRAZOLE SODIUM 40 MG PO TBEC: 40.0000 mg | DELAYED_RELEASE_TABLET | Freq: Two times a day (BID) | ORAL | 5 refills | Status: DC

## 2019-11-20 MED ORDER — PROMETHAZINE HCL 25 MG PO TABS: ORAL_TABLET | ORAL | 1 refills | Status: DC

## 2019-11-20 NOTE — Progress Notes (Signed)
Looks like referral was put in yesterday and I sent over refills on protonix today but the phenergan was not on current med list. The last time you prescribed was 2016 for 25mg  #24 one q 6 prn. Is this the same dose and directions you want [name] to send in

## 2019-11-20 NOTE — Progress Notes (Signed)
Phenergan 25 mg 1 twice daily as needed nausea, #14, 1 refill

## 2019-11-20 NOTE — Progress Notes (Signed)
Phenergan sent in to pharmacy as requested

## 2019-11-28 DIAGNOSIS — Z79899 Other long term (current) drug therapy: Secondary | ICD-10-CM | POA: Diagnosis not present

## 2019-11-28 DIAGNOSIS — M5441 Lumbago with sciatica, right side: Secondary | ICD-10-CM | POA: Diagnosis not present

## 2019-11-28 DIAGNOSIS — R300 Dysuria: Secondary | ICD-10-CM | POA: Diagnosis not present

## 2019-11-28 DIAGNOSIS — M5442 Lumbago with sciatica, left side: Secondary | ICD-10-CM | POA: Diagnosis not present

## 2019-11-28 DIAGNOSIS — G8929 Other chronic pain: Secondary | ICD-10-CM | POA: Diagnosis not present

## 2019-12-10 ENCOUNTER — Other Ambulatory Visit (HOSPITAL_COMMUNITY): Payer: Self-pay | Admitting: Psychiatry

## 2019-12-10 ENCOUNTER — Telehealth (HOSPITAL_COMMUNITY): Payer: Self-pay | Admitting: *Deleted

## 2019-12-10 NOTE — Telephone Encounter (Signed)
Patient called to reschedule her appt due to surgery schedule as previous appt

## 2019-12-11 ENCOUNTER — Ambulatory Visit (INDEPENDENT_AMBULATORY_CARE_PROVIDER_SITE_OTHER): Payer: Medicaid Other | Admitting: Psychiatry

## 2019-12-11 ENCOUNTER — Other Ambulatory Visit: Payer: Self-pay

## 2019-12-11 ENCOUNTER — Encounter (HOSPITAL_COMMUNITY): Payer: Self-pay | Admitting: Psychiatry

## 2019-12-11 DIAGNOSIS — F411 Generalized anxiety disorder: Secondary | ICD-10-CM | POA: Diagnosis not present

## 2019-12-11 DIAGNOSIS — F332 Major depressive disorder, recurrent severe without psychotic features: Secondary | ICD-10-CM | POA: Diagnosis not present

## 2019-12-11 DIAGNOSIS — F900 Attention-deficit hyperactivity disorder, predominantly inattentive type: Secondary | ICD-10-CM | POA: Diagnosis not present

## 2019-12-11 MED ORDER — TOPIRAMATE 50 MG PO TABS: 100.0000 mg | ORAL_TABLET | Freq: Two times a day (BID) | ORAL | 2 refills | Status: DC

## 2019-12-11 MED ORDER — AMPHETAMINE-DEXTROAMPHET ER 30 MG PO CP24: ORAL_CAPSULE | ORAL | 0 refills | Status: DC

## 2019-12-11 MED ORDER — AMPHETAMINE-DEXTROAMPHET ER 30 MG PO CP24: 30.0000 mg | ORAL_CAPSULE | Freq: Every day | ORAL | 0 refills | Status: DC

## 2019-12-11 MED ORDER — DULOXETINE HCL 60 MG PO CPEP: 60.0000 mg | ORAL_CAPSULE | Freq: Two times a day (BID) | ORAL | 2 refills | Status: DC

## 2019-12-11 MED ORDER — CLONAZEPAM 0.5 MG PO TABS: 0.5000 mg | ORAL_TABLET | Freq: Three times a day (TID) | ORAL | 2 refills | Status: DC | PRN

## 2019-12-11 MED ORDER — TRAZODONE HCL 100 MG PO TABS: 100.0000 mg | ORAL_TABLET | Freq: Every day | ORAL | 2 refills | Status: DC

## 2019-12-11 NOTE — Progress Notes (Signed)
Virtual Visit via Telephone Note  I connected with Penny [name] on 12/11/19 at  2:30 PM EST by telephone and verified that I am speaking with the correct person using two identifiers.   I discussed the limitations, risks, security and privacy concerns of performing an evaluation and management service by telephone and the availability of in person appointments. I also discussed with the patient that there may be a patient responsible charge related to this service. The patient expressed understanding and agreed to proceed.     I discussed the assessment and treatment plan with the patient. The patient was provided an opportunity to ask questions and all were answered. The patient agreed with the plan and demonstrated an understanding of the instructions.   The patient was advised to call back or seek an in-person evaluation if the symptoms worsen or if the condition fails to improve as anticipated.  I provided 15 minutes of non-face-to-face time during this encounter.   [name], MD  [facility] MD/PA/NP OP Progress Note  12/11/2019 2:52 PM Penny [name]  MRN:  [id number]  Chief Complaint:  Chief Complaint    Depression; Anxiety; Follow-up     HPI: This patient is a 34 year old female who is currently living with her boyfriend her daughter and her boyfriend's 2 daughters in [place].  Her son goes between her home and his father's home.  She is currently not working and has applied for disability.  The patient returns after 2 months for follow-up of her mood swings anxiety depression and ADHD.  She states in someway she is doing better.  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  She states that her abdominal pain is better and she is no longer vomiting.  She has been maintaining her weight around 185 pounds.  She does state that she has a lot of anxiety particularly because she is trying to help the children with her  online learning and finds it very frustrating.  Fortunately they are going back to school on January 21 except for her youngest child.  She states that she cannot sleep with the Belsomra and Ambien did not help so we will try trazodone.  I also strongly suggested she get back into therapy to help manage her anxiety which she states is worse even with the clonazepam.  Given her history of abusing medications I cannot go up any further on the clonazepam.  She agrees to supply her new therapist in the office.  She denies severe depression or suicidal ideation Visit Diagnosis:    ICD-10-CM   1. Major depressive disorder, recurrent episode, 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[place])  F33.2   2. GAD (generalized anxiety disorder)  F41.1   3. Attention deficit hyperactivity disorder (ADHD), predominantly inattentive type  F90.0     Past Psychiatric History: Psychiatric hospitalization last year for depression  Past Medical History:  Past Medical History:  Diagnosis Date  . ADD (attention deficit disorder)   . Anemia 2011   2o to GASTRIC BYPASS  . Anginal pain ([place])   . Anxiety   . Blood transfusion without reported diagnosis   . Chronic daily headache   . Depression   . Depression   . Dysmenorrhea 09/18/2013  . Dysrhythmia   . Elevated liver enzymes JUL 2011ALK PHOS 111-127 AST  143-267 ALT  213-321T BILI 0.6  ALB  3.7-4.06 Jun 2011 ALK PHOS 118 AST 24  ALT 42 T BILI 0.4 ALB 3.9  . Encounter for drug rehabilitation    at behavioral health for opioid addiction about 4 years ago  . Family history of adverse reaction to anesthesia    'dad had to be kept on pump for breathing for morphine'  . Fatty liver   . Fibroid 01/18/2017  . Fibromyalgia   . Gastric bypass status for obesity   . Gastritis JULY 2011  . Heart murmur   . 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 History of Holter monitoring   . Hx of opioid abuse ([place])    for about 4 years, about 4 years ago  . Interstitial  cystitis   . Iron deficiency anemia 07/23/2010  . Irritable bowel syndrome 2012 DIARRHEA   JUN 2012 TTG IgA 14.9  . IUD FEB 2010  . Lupus ([place])   . Menorrhagia 07/18/2013  . Migraines   . Obesity (BMI 30-39.9) 2011 228 LBS BMI 36.8  . Ovarian cyst   . Patient desires pregnancy 09/18/2013  . Polyneuropathy   . PONV (postoperative nausea and vomiting) seizure post-operatively   seizure following ankle surgery  . Potassium (K) deficiency   . Pregnant 12/25/2013  . Psychiatric pseudoseizure   . RLQ abdominal pain 07/18/2013  . Sciatica of left side 11/14/2014  . 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 Stress 09/18/2013    Past Surgical History:  Procedure Laterality Date  . BIOPSY  04/10/2015   Procedure: BIOPSY;  Surgeon: Daneil Dolin, MD;  Location: [facility];  Service: Endoscopy;;  . cath self every nite     for sodium bicarb injection (discontinued 2013)  . CHOLECYSTECTOMY  2005   biliary dyskinesia  . COLONOSCOPY  JUN 2012 ABD PN/DIARRHEA WITH PROPOFOL   NL COLON  . DILATION AND CURETTAGE OF UTERUS    . DILITATION & CURRETTAGE/HYSTROSCOPY WITH NOVASURE ABLATION N/A 03/24/2017   Procedure: DILATATION & CURETTAGE/HYSTEROSCOPY WITH NOVASURE ENDOMETRIAL ABLATION;  Surgeon: Jonnie Kind, MD;  Location: [facility];  Service: Gynecology;  Laterality: N/A;  . ESOPHAGEAL DILATION N/A 04/10/2015   Procedure: ESOPHAGEAL DILATION WITH 54FR MALONEY DILATOR;  Surgeon: Daneil Dolin, MD;  Location: [facility];  Service: Endoscopy;  Laterality: N/A;  . ESOPHAGOGASTRODUODENOSCOPY    . ESOPHAGOGASTRODUODENOSCOPY (EGD) WITH PROPOFOL N/A 04/10/2015   Procedure: ESOPHAGOGASTRODUODENOSCOPY (EGD) WITH PROPOFOL;  Surgeon: Daneil Dolin, MD;  Location: [facility];  Service: Endoscopy;  Laterality: N/A;  . ESOPHAGOGASTRODUODENOSCOPY (EGD) WITH PROPOFOL N/A 12/06/2016   Procedure: ESOPHAGOGASTRODUODENOSCOPY (EGD) WITH PROPOFOL;  Surgeon: Danie Binder, MD;  [facility];  Service: Endoscopy;  Laterality:  N/A;  . ESOPHAGOGASTRODUODENOSCOPY (EGD) WITH PROPOFOL N/A 04/27/2019   Procedure: ESOPHAGOGASTRODUODENOSCOPY (EGD) WITH PROPOFOL;  Surgeon: Rogene Houston, MD;  [facility];  Service: Endoscopy;  Laterality: N/A;  730  . ESOPHAGOGASTRODUODENOSCOPY (EGD) WITH PROPOFOL N/A 08/31/2019   Procedure: ESOPHAGOGASTRODUODENOSCOPY (EGD) WITH PROPOFOL;  Surgeon: Rogene Houston, MD;  [facility];  Service: Endoscopy;  Laterality: N/A;  . GAB  2007   in [place]-POUCH 5 CM  . GASTRIC BYPASS  06/2006  . HYSTEROSCOPY WITH D & C N/A 09/12/2014   Procedure: DILATATION AND CURETTAGE /HYSTEROSCOPY;  Surgeon: Jonnie Kind, MD;  Location: [facility];  Service: Gynecology;  Laterality: N/A;  . KYPHOPLASTY N/A 08/02/2018   Procedure: KYPHOPLASTY T12;  Surgeon: Melina Schools, MD;  Location: [place];  Service: Orthopedics;  Laterality: N/A;  60 mins  . LAPAROSCOPIC  TUBAL LIGATION Bilateral 03/24/2017   Procedure: LAPAROSCOPIC TUBAL LIGATION ([place] Rings);  Surgeon: Jonnie Kind, MD;  Location: [facility];  Service: Gynecology;  Laterality: Bilateral;  . REPAIR VAGINAL CUFF N/A 07/30/2014   Procedure: REPAIR VAGINAL CUFF;  Surgeon: Mora Bellman, MD;  Location: [place] [facility];  Service: Gynecology;  Laterality: N/A;  . SAVORY DILATION  06/20/2012   Dr. Barnie Alderman gastritis/Ulcer in the mid jejunum. Empiric dilation.   . SURAL NERVE BX Left 02/25/2016   Procedure: LEFT SURAL NERVE BIOPSY;  Surgeon: Jovita Gamma, MD;  Location: [facility] [facility];  Service: Neurosurgery;  Laterality: Left;  Left sural nerve biopsy  . TONSILLECTOMY    . TONSILLECTOMY AND ADENOIDECTOMY    . UPPER GASTROINTESTINAL ENDOSCOPY  JULY 2011 NAUSEA-D125,V6, PH 25   Bx; GASTRITIS, POUCH-5 CM LONG  . WISDOM TOOTH EXTRACTION      Family Psychiatric History: see below  Family History:  Family History  Problem Relation Age of Onset  . Hemochromatosis Maternal Grandmother   . Migraines Maternal Grandmother   . Cancer Maternal  Grandmother   . Breast cancer Maternal Grandmother   . Hypertension Father   . Diabetes Father   . Coronary artery disease Father   . Migraines Paternal Grandmother   . Breast cancer Paternal Grandmother   . Cancer Mother        breast  . Hemochromatosis Mother   . Breast cancer Mother   . Depression Mother   . Anxiety disorder Mother   . Coronary artery disease Paternal Grandfather   . Anxiety disorder Brother   . Bipolar disorder Brother   . Healthy Daughter   . Healthy Son     Social History:  Social History   Socioeconomic History  . Marital status: Divorced    Spouse name: Not on file  . Number of children: 2  . Years of education: some colle  . Highest education level: Not on file  Occupational History    Employer: NOT EMPLOYED  Tobacco Use  . Smoking status: Former Smoker    Packs/day: 0.25    Years: 6.00    Pack years: 1.50    Types: E-cigarettes    Start date: 05/2018  . Smokeless tobacco: Never Used  Substance and Sexual Activity  . Alcohol use: Yes    Alcohol/week: 0.0 standard drinks    Comment: 1-2 beers a couple times a month   . Drug use: Not Currently    Types: Marijuana    Comment: Previously opioid addiction went through rehab  . Sexual activity: Yes    Partners: Male    Birth control/protection: Surgical, Condom    Comment: tubal and ablation  Other Topics Concern  . Not on file  Social History Narrative   Patient is right handed.   Patient drinks one cup of coffee daily.   She lives in a one-story home with her two children (71 year old son, 67 month old daughter).   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 Difficulty of Paying Living Expenses: Very hard  Food Insecurity: No Food Insecurity  . Worried About Charity fundraiser in the Last Year: Never true  . Ran Out of Food in the Last Year: Never true   Transportation Needs: No Transportation Needs  . Lack of Transportation (Medical): No  . Lack of Transportation (Non-Medical): No  Physical Activity: Inactive  . Days of Exercise per Week: 0 days  . Minutes of Exercise per Session: 0 min  Stress: Stress Concern Present  . Feeling of Stress : Very much  Social Connections: Somewhat Isolated  . Frequency of Communication with Friends and Family: More than three times a week  . Frequency of Social Gatherings with Friends and Family: More than three times a week  . Attends Religious Services: More than 4 times per year  . Active Member of Clubs or Organizations: No  . Attends Archivist Meetings: Never  . Marital Status: Divorced    Allergies:  Allergies  Allergen Reactions  . Gabapentin Other (See Comments)    Pt states that she was unresponsive after taking this medication, but her vitals remained stable.    . Metoclopramide Hcl Anxiety and Other (See Comments)    Pt states that she felt like she was trapped in a box, and could not get out.  Pt also states that she had temporary loss of movement, weakness, and tingling.    . Tramadol Other (See Comments)    Seizures  . Ativan [Lorazepam] Other (See Comments)    combative  . Latex Itching, Rash and Other (See Comments)    Burns  . Lyrica [Pregabalin] Other (See Comments)    suicidal  . Trazodone And Nefazodone Other (See Comments)    Makes pt "like a zombie"  . Zofran [Ondansetron] Other (See Comments)    Migraines in high doses    . Sulfa Antibiotics Hives  . Butrans [Buprenorphine] Rash    Patch caused rash, didn't help with pain  . Nucynta [Tapentadol] Nausea And Vomiting  . Propofol Nausea And Vomiting  . Tape Itching, Rash and Other (See Comments)    Please use paper tape  . Toradol [Ketorolac Tromethamine] Anxiety    Metabolic Disorder Labs: Lab Results  Component Value Date   HGBA1C 5.7 (H) 01/15/2015   Lab Results  Component Value Date    PROLACTIN 80.0 (H) 05/09/2015   Lab Results  Component Value Date   CHOL 95 03/10/2012   TRIG 50 03/10/2012   HDL 38 (L) 03/10/2012   CHOLHDL 2.5 03/10/2012   VLDL 10 03/10/2012   LDLCALC 47 03/10/2012   Lab Results  Component Value Date   TSH 2.616 08/25/2019   TSH 1.592 05/25/2019    Therapeutic Level Labs: No results found for: LITHIUM No results found for: VALPROATE No components found for:  CBMZ  Current Medications: Current Outpatient Medications  Medication Sig Dispense Refill  . albuterol (PROVENTIL) (2.5 MG/3ML) 0.083% nebulizer solution Take 3 mLs (2.5 mg total) by nebulization every 4 (four) hours as needed for wheezing. 150 mL 12  . amphetamine-dextroamphetamine (ADDERALL XR) 30 MG 24 hr capsule TAKE (1) CAPSULE BY MOUTH EVERY DAY. 30 capsule 0  . amphetamine-dextroamphetamine (ADDERALL XR) 30 MG 24 hr capsule Take 1 capsule (30 mg total) by mouth daily. 30 capsule 0  . budesonide-formoterol (SYMBICORT) 160-4.5 MCG/ACT inhaler Inhale 2 puffs into the lungs 2 (two) times daily. 1 Inhaler 5  . cetirizine (ZYRTEC) 10 MG tablet Take 1 tablet (10 mg total) by mouth daily. 30 tablet 5  . clonazePAM (KLONOPIN) 0.5 MG tablet Take 1 tablet (0.5 mg total) by mouth 3 (three) times daily as needed for anxiety. 90 tablet 2  . dicyclomine (BENTYL) 10 MG capsule Take by mouth.    . DULoxetine (CYMBALTA) 60 MG capsule Take 1 capsule (60 mg total) by mouth  2 (two) times daily. 180 capsule 2  . fluticasone (FLONASE) 50 MCG/ACT nasal spray Place 1 spray into both nostrils daily as needed for allergies or rhinitis. 9.9 mL 2  . HYDROmorphone (DILAUDID) 2 MG tablet Take 1-2 tablets po q 4 hrs prn pain no greater than 9 tablets/day 65 tablet 0  . levalbuterol (XOPENEX HFA) 45 MCG/ACT inhaler Inhale 2 puffs into the lungs every 6 (six) hours as needed for wheezing. 1 Inhaler 5  . ondansetron (ZOFRAN-ODT) 4 MG disintegrating tablet Take 1 tablet (4 mg total) by mouth every 8 (eight) hours as  needed for nausea or vomiting. 20 tablet 0  . pantoprazole (PROTONIX) 40 MG tablet Take 1 tablet (40 mg total) by mouth 2 (two) times daily before a meal. 60 tablet 5  . promethazine (PHENERGAN) 25 MG tablet Take one tablet po BID prn nausea 14 tablet 1  . sucralfate (CARAFATE) 1 GM/10ML suspension Take 1 g by mouth 4 (four) times daily.    Marland Kitchen topiramate (TOPAMAX) 50 MG tablet Take 2 tablets (100 mg total) by mouth 2 (two) times daily. 60 tablet 2  . traZODone (DESYREL) 100 MG tablet Take 1 tablet (100 mg total) by mouth at bedtime. 30 tablet 2   No current facility-administered medications for this visit.     Musculoskeletal: Strength & Muscle Tone: within normal limits Gait & Station: normal Patient leans: N/A  Psychiatric Specialty Exam: Review of Systems  Neurological: Positive for headaches.  Psychiatric/Behavioral: Positive for sleep disturbance. The patient is nervous/anxious.   All other systems reviewed and are negative.   There were no vitals taken for this visit.There is no height or weight on file to calculate BMI.  General Appearance: NA  Eye Contact:  NA  Speech:  Clear and Coherent  Volume:  Normal  Mood:  Anxious  Affect:  NA  Thought Process:  Goal Directed  Orientation:  Full (Time, Place, and Person)  Thought Content: Rumination   Suicidal Thoughts:  No  Homicidal Thoughts:  No  Memory:  Immediate;   Good Recent;   Good Remote;   Good  Judgement:  Fair  Insight:  Fair  Psychomotor Activity:  Decreased  Concentration:  Concentration: Good and Attention Span: Good  Recall:  Good  Fund of Knowledge: Good  Language: Good  Akathisia:  No  Handed:  Right  AIMS (if indicated): not done  Assets:  Communication Skills Desire for Improvement Resilience Social Support Talents/Skills  ADL's:  Intact  Cognition: WNL  Sleep:  Poor   Screenings: AUDIT     ED to [facility]Ad[facility] (Discharged) from 05/09/2015 in [facility]   Alcohol Use Disorder Identification Test Final Score (AUDIT)  0    GAD-7     Counselor from 12/11/2018 in [facility] Counselor from 02/07/2017 in [facility]  Total GAD-7 Score  19  17    PHQ2-9     Counselor from 12/11/2018 in [facility] Counselor from 07/22/2017 in [facility] Counselor from 02/07/2017 in [facility] [facility] Visit from 01/10/2017 in [facility] Counselor from 10/06/2016 in [facility]  PHQ-2 Total Score  _0 PHQ-9 Total Score  _1 Assessment and Plan: This patient is a 34 year old female with a history depression anxiety  and ADHD.  She still complains of constant feelings of panic and inability to sleep.  I cannot crease her clonazepam from the 0.5 mg 3 times daily given her history.  I have referred her for counseling to help her manage her anxiety.  We will try trazodone 100 mg at bedtime for sleep.  She will continue Cymbalta 60 mg twice daily for depression and chronic pain Adderall XR 30 mg every morning for focus and Topamax 100 mg twice daily for mood stabilization and headaches.  She will return to see me in 2 months   [name], MD 12/11/2019, 2:52 PM

## 2019-12-17 ENCOUNTER — Ambulatory Visit (HOSPITAL_COMMUNITY): Payer: Medicaid Other | Admitting: Psychiatry

## 2019-12-17 DIAGNOSIS — F419 Anxiety disorder, unspecified: Secondary | ICD-10-CM | POA: Diagnosis not present

## 2019-12-17 DIAGNOSIS — Z9884 Bariatric surgery status: Secondary | ICD-10-CM | POA: Diagnosis not present

## 2019-12-17 DIAGNOSIS — K219 Gastro-esophageal reflux disease without esophagitis: Secondary | ICD-10-CM | POA: Diagnosis not present

## 2019-12-17 DIAGNOSIS — Z980 Intestinal bypass and anastomosis status: Secondary | ICD-10-CM | POA: Diagnosis not present

## 2019-12-17 DIAGNOSIS — K580 Irritable bowel syndrome with diarrhea: Secondary | ICD-10-CM | POA: Diagnosis not present

## 2019-12-17 DIAGNOSIS — Z4659 Encounter for fitting and adjustment of other gastrointestinal appliance and device: Secondary | ICD-10-CM | POA: Diagnosis not present

## 2019-12-17 DIAGNOSIS — D649 Anemia, unspecified: Secondary | ICD-10-CM | POA: Diagnosis not present

## 2019-12-17 DIAGNOSIS — Z87891 Personal history of nicotine dependence: Secondary | ICD-10-CM | POA: Diagnosis not present

## 2019-12-17 DIAGNOSIS — Z885 Allergy status to narcotic agent status: Secondary | ICD-10-CM | POA: Diagnosis not present

## 2019-12-17 DIAGNOSIS — M5430 Sciatica, unspecified side: Secondary | ICD-10-CM | POA: Diagnosis not present

## 2019-12-17 DIAGNOSIS — F329 Major depressive disorder, single episode, unspecified: Secondary | ICD-10-CM | POA: Diagnosis not present

## 2019-12-17 DIAGNOSIS — R109 Unspecified abdominal pain: Secondary | ICD-10-CM | POA: Diagnosis not present

## 2019-12-17 DIAGNOSIS — R197 Diarrhea, unspecified: Secondary | ICD-10-CM | POA: Diagnosis not present

## 2019-12-17 DIAGNOSIS — K316 Fistula of stomach and duodenum: Secondary | ICD-10-CM | POA: Diagnosis not present

## 2019-12-17 DIAGNOSIS — R1013 Epigastric pain: Secondary | ICD-10-CM | POA: Diagnosis not present

## 2019-12-17 DIAGNOSIS — Z9049 Acquired absence of other specified parts of digestive tract: Secondary | ICD-10-CM | POA: Diagnosis not present

## 2019-12-17 DIAGNOSIS — G8929 Other chronic pain: Secondary | ICD-10-CM | POA: Diagnosis not present

## 2019-12-17 DIAGNOSIS — T182XXA Foreign body in stomach, initial encounter: Secondary | ICD-10-CM | POA: Diagnosis not present

## 2019-12-17 DIAGNOSIS — G629 Polyneuropathy, unspecified: Secondary | ICD-10-CM | POA: Diagnosis not present

## 2019-12-17 DIAGNOSIS — T85898A Other specified complication of other internal prosthetic devices, implants and grafts, initial encounter: Secondary | ICD-10-CM | POA: Diagnosis not present

## 2019-12-17 DIAGNOSIS — Z91040 Latex allergy status: Secondary | ICD-10-CM | POA: Diagnosis not present

## 2019-12-17 DIAGNOSIS — E46 Unspecified protein-calorie malnutrition: Secondary | ICD-10-CM | POA: Diagnosis not present

## 2019-12-17 DIAGNOSIS — Z882 Allergy status to sulfonamides status: Secondary | ICD-10-CM | POA: Diagnosis not present

## 2019-12-17 MED ORDER — LACTATED RINGERS IV SOLN
10.00 | INTRAVENOUS | Status: DC
Start: ? — End: 2019-12-17

## 2019-12-17 MED ORDER — HYDROMORPHONE HCL 2 MG PO TABS
2.00 | ORAL_TABLET | ORAL | Status: DC
Start: ? — End: 2019-12-17

## 2019-12-17 MED ORDER — PANTOPRAZOLE SODIUM 40 MG PO TBEC
40.00 | DELAYED_RELEASE_TABLET | ORAL | Status: DC
Start: 2019-12-18 — End: 2019-12-17

## 2019-12-17 MED ORDER — HALOPERIDOL 5 MG PO TABS
5.00 | ORAL_TABLET | ORAL | Status: DC
Start: ? — End: 2019-12-17

## 2019-12-25 ENCOUNTER — Ambulatory Visit (HOSPITAL_COMMUNITY): Payer: Medicaid Other | Admitting: Clinical

## 2019-12-25 ENCOUNTER — Telehealth (HOSPITAL_COMMUNITY): Payer: Self-pay | Admitting: Clinical

## 2019-12-25 ENCOUNTER — Other Ambulatory Visit: Payer: Self-pay

## 2019-12-25 NOTE — Telephone Encounter (Signed)
The OPT therapist attempted text to session x2 @ 10:00AM and 10:08AM, however, the patient did not respond and missed her scheduled sesssion.

## 2019-12-26 DIAGNOSIS — M62830 Muscle spasm of back: Secondary | ICD-10-CM | POA: Diagnosis not present

## 2019-12-26 DIAGNOSIS — M5441 Lumbago with sciatica, right side: Secondary | ICD-10-CM | POA: Diagnosis not present

## 2019-12-26 DIAGNOSIS — G8929 Other chronic pain: Secondary | ICD-10-CM | POA: Diagnosis not present

## 2019-12-26 DIAGNOSIS — M5442 Lumbago with sciatica, left side: Secondary | ICD-10-CM | POA: Diagnosis not present

## 2019-12-26 DIAGNOSIS — Z79899 Other long term (current) drug therapy: Secondary | ICD-10-CM | POA: Diagnosis not present

## 2019-12-28 ENCOUNTER — Ambulatory Visit: Payer: Medicaid Other | Admitting: Neurology

## 2019-12-31 IMAGING — DX DG CHEST 2V
2 series · 2 of 2 positions shown · non-contrast
Comparison: Chest x-ray 12/17/2016.

CLINICAL DATA: 31-year-old female with dyspnea since 01/09/2018.

EXAM:
CHEST  2 VIEW

[chest lat]
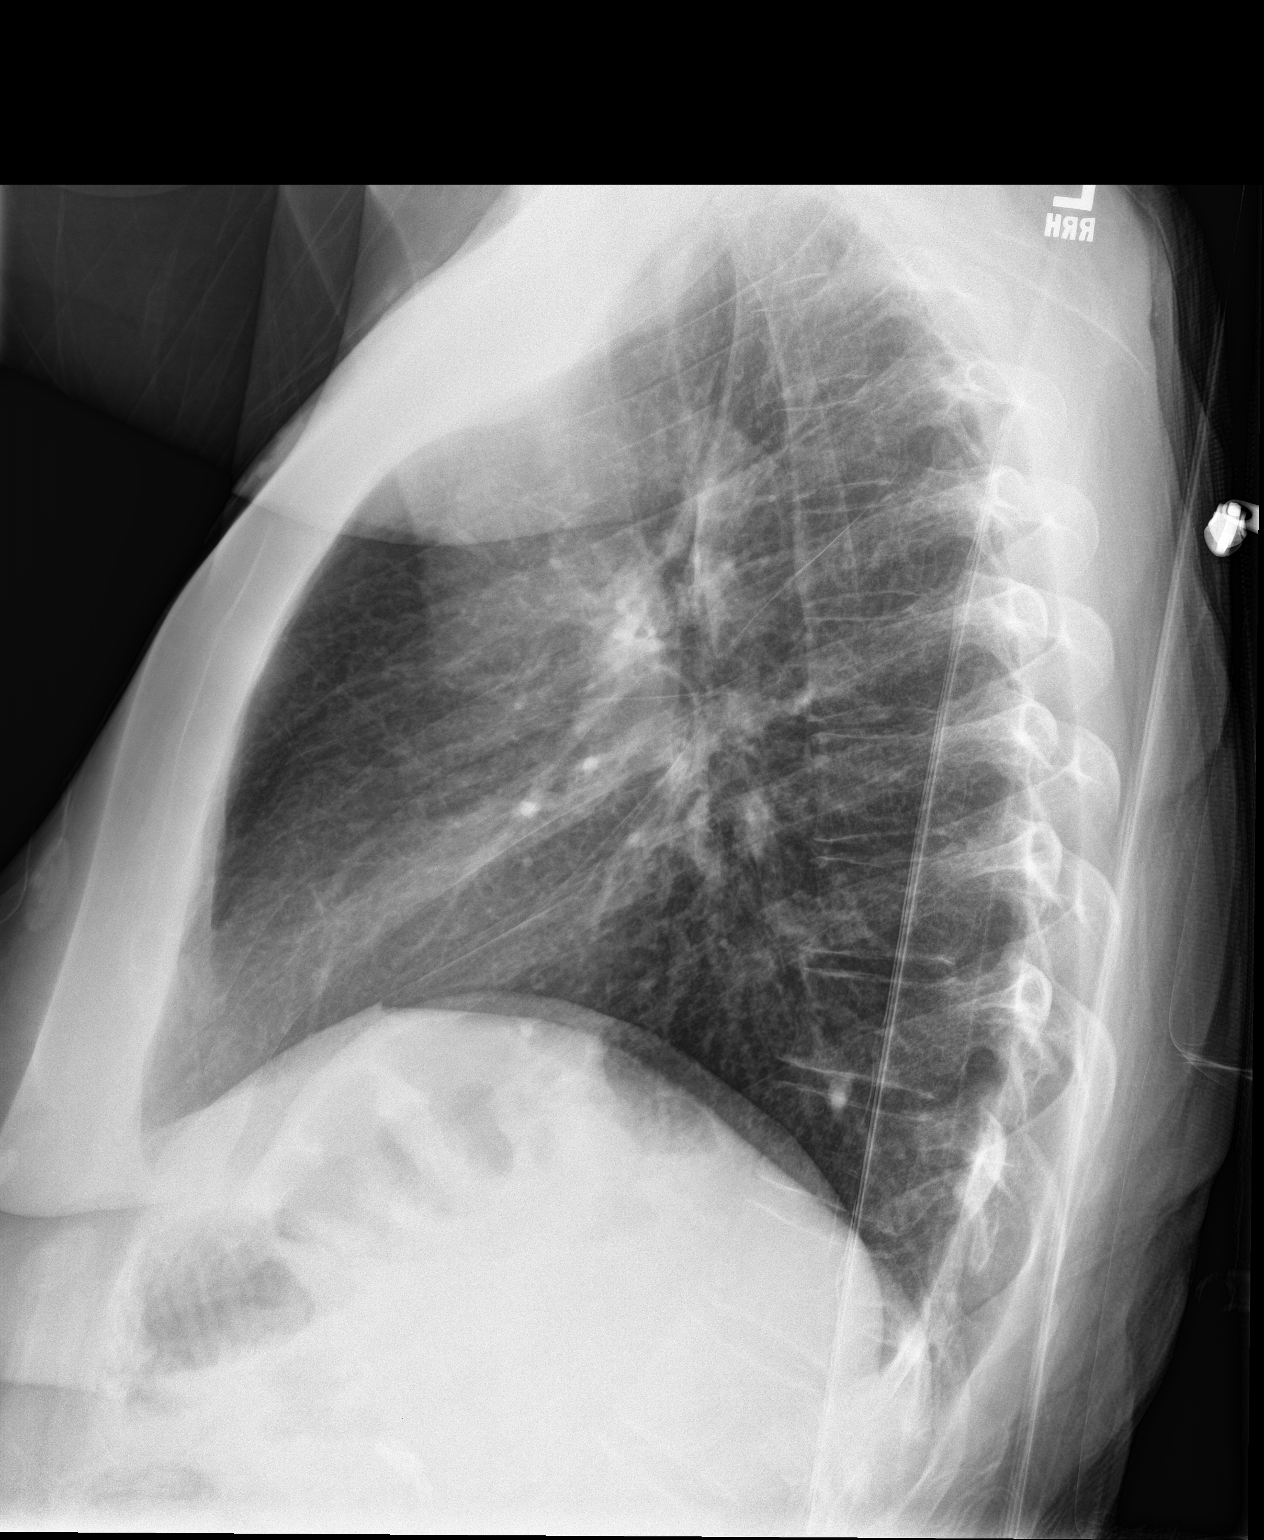

[chest ap]
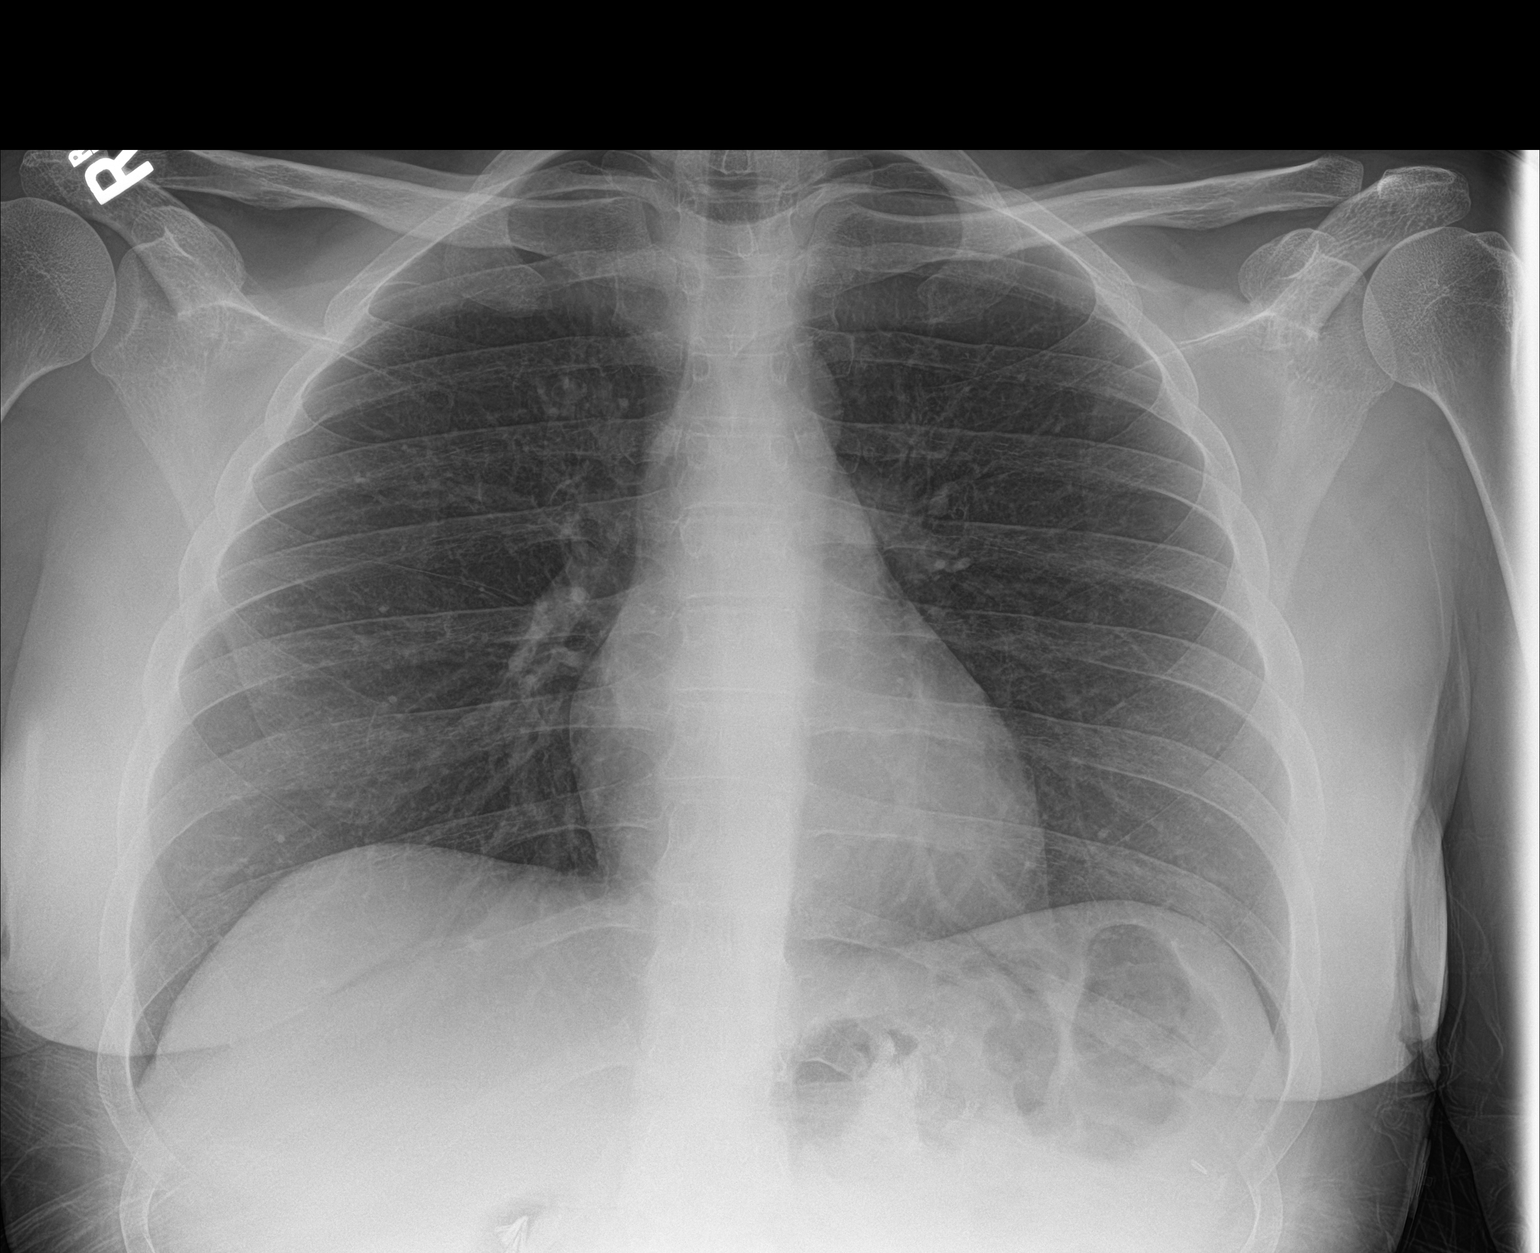

[2 of 2 positions shown; findings below may reference images not displayed]

FINDINGS: Lung volumes are normal. No consolidative airspace disease. No
pleural effusions. No pneumothorax. No pulmonary nodule or mass
noted. Pulmonary vasculature and the cardiomediastinal silhouette
are within normal limits.
IMPRESSION: No radiographic evidence of acute cardiopulmonary disease.

## 2020-01-03 ENCOUNTER — Encounter (HOSPITAL_COMMUNITY): Payer: Self-pay | Admitting: *Deleted

## 2020-01-03 ENCOUNTER — Other Ambulatory Visit: Payer: Self-pay

## 2020-01-03 ENCOUNTER — Emergency Department (HOSPITAL_COMMUNITY): Payer: Medicaid Other

## 2020-01-03 ENCOUNTER — Emergency Department (HOSPITAL_COMMUNITY)
Admission: EM | Admit: 2020-01-03 | Discharge: 2020-01-04 | Disposition: A | Discharge status: Home / Self Care | Payer: Medicaid Other | Attending: Emergency Medicine | Admitting: Emergency Medicine

## 2020-01-03 DIAGNOSIS — Z91040 Latex allergy status: Secondary | ICD-10-CM | POA: Diagnosis not present

## 2020-01-03 DIAGNOSIS — Z87891 Personal history of nicotine dependence: Secondary | ICD-10-CM | POA: Diagnosis not present

## 2020-01-03 DIAGNOSIS — R112 Nausea with vomiting, unspecified: Secondary | ICD-10-CM | POA: Insufficient documentation

## 2020-01-03 DIAGNOSIS — R1010 Upper abdominal pain, unspecified: Secondary | ICD-10-CM | POA: Diagnosis not present

## 2020-01-03 DIAGNOSIS — Z79899 Other long term (current) drug therapy: Secondary | ICD-10-CM | POA: Insufficient documentation

## 2020-01-03 DIAGNOSIS — Z9884 Bariatric surgery status: Secondary | ICD-10-CM | POA: Diagnosis not present

## 2020-01-03 DIAGNOSIS — R1011 Right upper quadrant pain: Secondary | ICD-10-CM | POA: Diagnosis not present

## 2020-01-03 DIAGNOSIS — R109 Unspecified abdominal pain: Secondary | ICD-10-CM | POA: Diagnosis not present

## 2020-01-03 DIAGNOSIS — K5900 Constipation, unspecified: Secondary | ICD-10-CM | POA: Diagnosis not present

## 2020-01-03 LAB — COMPREHENSIVE METABOLIC PANEL
ALT: 23 U/L (ref 0–44)
AST: 21 U/L (ref 15–41)
Albumin: 3.9 g/dL (ref 3.5–5.0)
Alkaline Phosphatase: 61 U/L (ref 38–126)
Anion gap: 8 (ref 5–15)
BUN: 11 mg/dL (ref 6–20)
CO2: 28 mmol/L (ref 22–32)
Calcium: 9.8 mg/dL (ref 8.9–10.3)
Chloride: 102 mmol/L (ref 98–111)
Creatinine, Ser: 0.59 mg/dL (ref 0.44–1.00)
GFR calc Af Amer: >60 mL/min (ref >60)
GFR calc non Af Amer: >60 mL/min (ref >60)
Glucose, Bld: 108 mg/dL — ABNORMAL HIGH (ref 70–99)
Potassium: 4.3 mmol/L (ref 3.5–5.1)
Sodium: 138 mmol/L (ref 135–145)
Total Bilirubin: 0.7 mg/dL (ref 0.3–1.2)
Total Protein: 6.8 g/dL (ref 6.5–8.1)

## 2020-01-03 LAB — LACTIC ACID, PLASMA
Lactic Acid, Venous: 0.6 mmol/L (ref 0.5–1.9)
Lactic Acid, Venous: 0.9 mmol/L (ref 0.5–1.9)

## 2020-01-03 LAB — URINALYSIS, ROUTINE W REFLEX MICROSCOPIC
Bilirubin Urine: NEGATIVE
Glucose, UA: NEGATIVE mg/dL
Hgb urine dipstick: NEGATIVE
Ketones, ur: NEGATIVE mg/dL
Leukocytes,Ua: NEGATIVE
Nitrite: NEGATIVE
Protein, ur: NEGATIVE mg/dL
Specific Gravity, Urine: 1.011 (ref 1.005–1.030)
pH: 8 (ref 5.0–8.0)

## 2020-01-03 LAB — CBC
HCT: 40.1 % (ref 36.0–46.0)
Hemoglobin: 13.4 g/dL (ref 12.0–15.0)
MCH: 31.5 pg (ref 26.0–34.0)
MCHC: 33.4 g/dL (ref 30.0–36.0)
MCV: 94.1 fL (ref 80.0–100.0)
Platelets: 226 10*3/uL (ref 150–400)
RBC: 4.26 MIL/uL (ref 3.87–5.11)
RDW: 13.1 % (ref 11.5–15.5)
WBC: 5.6 10*3/uL (ref 4.0–10.5)
nRBC: 0 % (ref 0.0–0.2)

## 2020-01-03 LAB — LIPASE, BLOOD: Lipase: 15 U/L (ref 11–51)

## 2020-01-03 MED ORDER — SODIUM CHLORIDE 0.9% FLUSH: 3.0000 mL | Freq: Once | INTRAVENOUS | Status: DC

## 2020-01-03 MED ORDER — HYDROMORPHONE HCL 1 MG/ML IJ SOLN
1.0000 mg | Freq: Once | INTRAMUSCULAR | Status: AC
  Administered 2020-01-03: 1 mg via INTRAVENOUS
  Filled 2020-01-03: qty 1

## 2020-01-03 MED ORDER — PROMETHAZINE HCL 25 MG/ML IJ SOLN
25.0000 mg | Freq: Once | INTRAMUSCULAR | Status: AC
  Administered 2020-01-03: 25 mg via INTRAVENOUS
  Filled 2020-01-03: qty 1

## 2020-01-03 MED ORDER — SENNA 8.6 MG PO TABS: 2.0000 | ORAL_TABLET | Freq: Two times a day (BID) | ORAL | 0 refills | Status: AC

## 2020-01-03 MED ORDER — SODIUM CHLORIDE 0.9 % IV BOLUS
1000.0000 mL | Freq: Once | INTRAVENOUS | Status: AC
  Administered 2020-01-03: 1000 mL via INTRAVENOUS

## 2020-01-03 NOTE — ED Provider Notes (Addendum)
Care assumed from [facility], PA-C at shift change. See her note for full H&P.  Per her note, " Penny [name] is a 34 y.o. female with past medical history significant for gastric bypass, chronic abdominal pain, chronic daily headache, elevated LFT, IBS, sphincter of Oddi dysfunction, followed by [facility] for chronic abdominal pain who presents for evaluation of abdominal pain and emesis.  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  She had EGD and had stent placed to her gallbladder according to patient.  She had this removed on 12/18/19.  Patient has had progressively worsening pain since her stent removal.  She has been taking Dilaudid, Phenergan and Zofran at home.  Her pain was previously managed with a Dilaudid however has worsened over the last 3 days.  Patient states she is had multiple episodes of NBNB emesis.  Patient states she can also not remember the last time she had a bowel movement.  States she does occasionally pass flatulent.  Rates her pain a 10/10.  Denies additional aggravating or alleviating factors.  Denies fever, chills, chest pain, shortness of breath, dysuria, chance of pregnancy, diarrhea, rashes, lesions.  Patient spends a significant amount of time on interviewed focused on narcotic pain meds.  Dr. Filbert Schilder with [facility]  Per patient pain did not improve after sphincterotomy and removed it due to worsening pain. "  Physical Exam  BP 111/73   Pulse 97   Temp 97.7 F (36.5 C) (Oral)   Resp 19   Ht 5\' 6"  (1.676 m)   Wt 83.9 kg   SpO2 100%   BMI 29.86 kg/m   Physical Exam Vitals and nursing note reviewed.  Constitutional:      General: She is not in acute distress.    Appearance: She is well-developed.  HENT:     Head: Normocephalic and atraumatic.  Eyes:     Conjunctiva/sclera: Conjunctivae normal.  Cardiovascular:     Rate and Rhythm: Normal rate.  Pulmonary:     Effort: Pulmonary effort is normal.  Abdominal:      General: Abdomen is flat.     Palpations: Abdomen is soft.     Comments: Reports generalized TTP but there is no guarding or rebound on exam  Musculoskeletal:        General: Normal range of motion.     Cervical back: Neck supple.  Skin:    General: Skin is warm and dry.  Neurological:     Mental Status: She is alert.     ED Course/Procedures   Clinical Course as of Jan 04 40  Thu Jan 03, 2020  2057 No leukocystosis  CBC [BH]  2109 Lipase 15  Lipase, blood [BH]  2109 Mildly elevated glucose, no additional electrolyte, renal or liver abnormality.  Comprehensive metabolic panel(!) [BH]  123XX123 0.6  Lactic acid, plasma [BH]    Clinical Course User Index [BH] Henderly, Britni A, PA-C    Procedures  Results for orders placed or performed during the hospital encounter of 01/03/20  Lipase, blood  Result Value Ref Range   Lipase 15 11 - 51 U/L  Comprehensive metabolic panel  Result Value Ref Range   Sodium 138 135 - 145 mmol/L   Potassium 4.3 3.5 - 5.1 mmol/L   Chloride 102 98 - 111 mmol/L   CO2 28 22 - 32 mmol/L   Glucose, Bld 108 (H) 70 - 99 mg/dL   BUN 11 6 - 20 mg/dL   Creatinine, Ser 0.59 0.44 -  1.00 mg/dL   Calcium 9.8 8.9 - 10.3 mg/dL   Total Protein 6.8 6.5 - 8.1 g/dL   Albumin 3.9 3.5 - 5.0 g/dL   AST 21 15 - 41 U/L   ALT 23 0 - 44 U/L   Alkaline Phosphatase 61 38 - 126 U/L   Total Bilirubin 0.7 0.3 - 1.2 mg/dL   GFR calc non Af Amer >60 >60 mL/min   GFR calc Af Amer >60 >60 mL/min   Anion gap 8 5 - 15  CBC  Result Value Ref Range   WBC 5.6 4.0 - 10.5 K/uL   RBC 4.26 3.87 - 5.11 MIL/uL   Hemoglobin 13.4 12.0 - 15.0 g/dL   HCT 40.1 36.0 - 46.0 %   MCV 94.1 80.0 - 100.0 fL   MCH 31.5 26.0 - 34.0 pg   MCHC 33.4 30.0 - 36.0 g/dL   RDW 13.1 11.5 - 15.5 %   Platelets 226 150 - 400 K/uL   nRBC 0.0 0.0 - 0.2 %  Urinalysis, Routine w reflex microscopic  Result Value Ref Range   Color, Urine YELLOW YELLOW   APPearance HAZY (A) CLEAR   Specific Gravity,  Urine 1.011 1.005 - 1.030   pH 8.0 5.0 - 8.0   Glucose, UA NEGATIVE NEGATIVE mg/dL   Hgb urine dipstick NEGATIVE NEGATIVE   Bilirubin Urine NEGATIVE NEGATIVE   Ketones, ur NEGATIVE NEGATIVE mg/dL   Protein, ur NEGATIVE NEGATIVE mg/dL   Nitrite NEGATIVE NEGATIVE   Leukocytes,Ua NEGATIVE NEGATIVE  Lactic acid, plasma  Result Value Ref Range   Lactic Acid, Venous 0.6 0.5 - 1.9 mmol/L  Lactic acid, plasma  Result Value Ref Range   Lactic Acid, Venous 0.9 0.5 - 1.9 mmol/L   *Note: Due to a large number of results and/or encounters for the requested time period, some results have not been displayed. A complete set of results can be found in Results Review.   CT ABDOMEN PELVIS WO CONTRAST  Result Date: 01/03/2020 CLINICAL DATA:  Chronic abdominal pain. EXAM: CT ABDOMEN AND PELVIS WITHOUT CONTRAST TECHNIQUE: Multidetector CT imaging of the abdomen and pelvis was performed following the standard protocol without IV contrast. COMPARISON:  October 20, 2019 FINDINGS: Lower chest: No acute abnormality. Hepatobiliary: No focal liver abnormality is seen. Status post cholecystectomy. No biliary dilatation. Pancreas: Unremarkable. No pancreatic ductal dilatation or surrounding inflammatory changes. Spleen: Normal in size without focal abnormality. Adrenals/Urinary Tract: Adrenal glands are unremarkable. Kidneys are normal, without renal calculi, focal lesion, or hydronephrosis. Bladder is unremarkable. Stomach/Bowel: Multiple surgical sutures are seen within the gastric region. The appendix is not identified. No evidence of bowel wall thickening, distention, or inflammatory changes. A large amount of stool is seen throughout the colon. Vascular/Lymphatic: No significant vascular findings are present. No enlarged abdominal or pelvic lymph nodes. Reproductive: Uterus and bilateral adnexa are unremarkable. Other: No abdominal wall hernia or abnormality. No abdominopelvic ascites. Musculoskeletal: Degenerative  changes seen throughout the lumbar spine with evidence of prior vertebroplasty noted at the level of T12. IMPRESSION: 1. 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 2. Evidence of prior cholecystectomy. Electronically Signed   By: Virgina Norfolk M.D.   On: 01/03/2020 22:55     MDM   Briefly, 34 y/o pt with h/o chronic abd pain presenting for acute exacerbation of chronic abd pain. At shift change, pending CT abd/pelvis and PO chal.   Reviewed labs which are reassuring. UA neg for UTI  CT abd/pelvis showed a large amount  of stool throughout the colon, but otherwise there were no acute findings. Pt was given ginger ale for PO challenge and was able to tolerate it.   HR improved to the 90s after medication administration.   She will be given meds for a bowel regimen. Advised her to f/u with her GI doctor and [facility] for new or worsening sxs. She voices understanding of the plan and reasons to return. All questions answered, pt stable for d/c.     Rodney Booze, PA-C 01/04/20 0036    Rodney Booze, PA-C 01/04/20 0037    Nasteho Glantz S, PA-C 01/04/20 0041    Maudie Flakes, MD 01/07/20 [phone]

## 2020-01-03 NOTE — ED Provider Notes (Signed)
[facility] EMERGENCY DEPARTMENT Provider Note   CSN: [id number] Arrival date & time: 01/03/20  1935    History Chief Complaint  Patient presents with  . Abdominal Pain    Penny [name] is a 34 y.o. female with past medical history significant for gastric bypass, chronic abdominal pain, chronic daily headache, elevated LFT, IBS, sphincter of Oddi dysfunction, followed by [facility] for chronic abdominal pain who presents for evaluation of abdominal pain and emesis.  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  She had EGD and had stent placed to her gallbladder according to patient.  She had this removed on 12/18/19.  Patient has had progressively worsening pain since her stent removal.  She has been taking Dilaudid, Phenergan and Zofran at home.  Her pain was previously managed with a Dilaudid however has worsened over the last 3 days.  Patient states she is had multiple episodes of NBNB emesis.  Patient states she can also not remember the last time she had a bowel movement.  States she does occasionally pass flatulent.  Rates her pain a 10/10.  Denies additional aggravating or alleviating factors.  Denies fever, chills, chest pain, shortness of breath, dysuria, chance of pregnancy, diarrhea, rashes, lesions.  Patient spends a significant amount of time on interviewed focused on narcotic pain meds.  Dr. Filbert Schilder with [facility]  Per patient pain did not improve after sphincterotomy and removed it due to worsening pain.  History obtained from patient and past medical records.  No interpreter is used.  HPI     Past Medical History:  Diagnosis Date  . ADD (attention deficit disorder)   . Anemia 2011   2o to GASTRIC BYPASS  . Anginal pain ([place])   . Anxiety   . Blood transfusion without reported diagnosis   . Chronic daily headache   . Depression   . Depression   . Dysmenorrhea 09/18/2013  . Dysrhythmia   . Elevated liver enzymes JUL 2011ALK PHOS 111-127 AST   143-267 ALT  213-321T BILI 0.6  ALB  3.7-4.06 Jun 2011 ALK PHOS 118 AST 24 ALT 42 T BILI 0.4 ALB 3.9  . Encounter for drug rehabilitation    at behavioral health for opioid addiction about 4 years ago  . Family history of adverse reaction to anesthesia    'dad had to be kept on pump for breathing for morphine'  . Fatty liver   . Fibroid 01/18/2017  . Fibromyalgia   . Gastric bypass status for obesity   . Gastritis JULY 2011  . Heart murmur   . 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 History of Holter monitoring   . Hx of opioid abuse ([place])    for about 4 years, about 4 years ago  . Interstitial cystitis   . Iron deficiency anemia 07/23/2010  . Irritable bowel syndrome 2012 DIARRHEA   JUN 2012 TTG IgA 14.9  . IUD FEB 2010  . Lupus ([place])   . Menorrhagia 07/18/2013  . Migraines   . Obesity (BMI 30-39.9) 2011 228 LBS BMI 36.8  . Ovarian cyst   . Patient desires pregnancy 09/18/2013  . Polyneuropathy   . PONV (postoperative nausea and vomiting) seizure post-operatively   seizure following ankle surgery  . Potassium (K) deficiency   . Pregnant 12/25/2013  . Psychiatric pseudoseizure   . RLQ abdominal pain 07/18/2013  . Sciatica of left side 11/14/2014  . 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 Stress 09/18/2013  Patient Active Problem List   Diagnosis Date Noted  . Dehydration   . Intractable abdominal pain 09/19/2019  . Intractable nausea and vomiting 09/19/2019  . Gastroesophageal reflux disease without esophagitis   . Cannabis abuse 08/30/2019  . Dyspnea   . Severe anxiety 08/21/2019  . Hematemesis with nausea 04/23/2019  . Absolute anemia 04/23/2019  . Urge incontinence 10/26/2018  . S/P kyphoplasty 08/02/2018  . Herpes genitalis in women 12/19/2017  . Abnormal urine odor 03/30/2017  . Urinary tract infection with hematuria 03/30/2017  . Screening for genitourinary condition 03/30/2017  . Pain 03/30/2017  . Burning with urination  03/30/2017  . Family history of breast cancer in mother,uncertain BR CA status 02/28/2017  . Fibroid 01/18/2017  . Chronic pain syndrome 12/05/2016  . Folate deficiency 12/04/2016  . Nausea vomiting and diarrhea 12/04/2016  . Severe anemia 12/04/2016  . Symptomatic anemia 12/03/2016  . Complaints of leg weakness   . Paresthesia   . Left-sided weakness   . Uncontrolled pain 01/24/2016  . Malnutrition of moderate degree 01/24/2016  . Neuropathy 01/23/2016  . Inability to walk 01/23/2016  . Paresthesias   . Bilateral leg numbness 11/26/2015  . 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 Post partum depression 05/09/2015  . Anastomotic ulcer   . Dysphagia   . Hematemesis 04/07/2015  . Intractable vomiting with nausea 04/07/2015  . Elevated liver enzymes   . Epigastric pain   . Pseudoseizure ([place]) 02/22/2015  . Seizure-like activity ([place]) 02/22/2015  . Fibromyalgia 02/18/2015  . Vulvar fissure 02/18/2015  . Clinical depression 02/01/2015  . Current smoker 01/29/2015  . Current tobacco use 01/29/2015  . 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 Leg weakness, bilateral 01/02/2015  . Gait disorder 01/02/2015  . Other symptoms and signs involving the musculoskeletal system 01/02/2015  . Abnormal gait 01/02/2015  . Cervicalgia 12/18/2014  . Sciatica of left side 11/14/2014  . Neuralgia neuritis, sciatic nerve 11/14/2014  . 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 Encounter for sterilization 09/09/2014  . Abdominal pain, acute, right lower quadrant 08/22/2014  . Headache, migraine 08/19/2014  . Seizure ([place] ) 08/18/2014  . Encephalopathy 08/13/2014  . Headache 08/13/2014  . Altered mental status 08/13/2014  . Right leg numbness 07/31/2014  . Disturbance of skin sensation 07/31/2014  . Hypertension in pregnancy, transient 07/08/2014  . Previous gastric bypass affecting pregnancy, antepartum 05/22/2014  . Patent foramen  ovale with right to left shunt 05/17/2014  . ASD (atrial septal defect), ostium secundum 05/17/2014  . Depression complicating pregnancy in second trimester, antepartum 05/09/2014  . Antepartum mental disorder in pregnancy 05/09/2014  . Rapid palpitations 02/18/2014  . H/O maternal third degree perineal laceration, currently pregnant 02/18/2014  . High-risk pregnancy 02/18/2014  . Supervision of pregnancy with other poor reproductive or obstetric history, unspecified trimester 02/18/2014  . Restless legs 01/16/2014  . Restless leg 01/16/2014  . Chronic interstitial cystitis 10/23/2013  . Menorrhagia 07/18/2013  . Excessive and frequent menstruation 07/18/2013  . h/o Opiate addiction 03/11/2012    Class: Acute  . History of migraine headaches 03/10/2012    Class: Acute  . Depression with anxiety 03/10/2012    Class: Chronic  . 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 Panic disorder without agoraphobia 03/10/2012  . H/O disease 03/10/2012  . Dysthymia 03/10/2012  . Pelvic congestion syndrome 10/13/2011  .  Coitalgia 10/13/2011  . Chronic migraine without aura 10/13/2011  . Unspecified dyspareunia 10/13/2011  . IBS (irritable bowel syndrome) 08/25/2011  . Diarrhea 05/27/2011  . OBESITY, UNSPECIFIED 09/17/2010  . Transaminitis 09/17/2010  . IDA (iron deficiency anemia) 07/23/2010    Past Surgical History:  Procedure Laterality Date  . BIOPSY  04/10/2015   Procedure: BIOPSY;  Surgeon: Daneil Dolin, MD;  Location: [facility];  Service: Endoscopy;;  . cath self every nite     for sodium bicarb injection (discontinued 2013)  . CHOLECYSTECTOMY  2005   biliary dyskinesia  . COLONOSCOPY  JUN 2012 ABD PN/DIARRHEA WITH PROPOFOL   NL COLON  . DILATION AND CURETTAGE OF UTERUS    . DILITATION & CURRETTAGE/HYSTROSCOPY WITH NOVASURE ABLATION N/A  03/24/2017   Procedure: DILATATION & CURETTAGE/HYSTEROSCOPY WITH NOVASURE ENDOMETRIAL ABLATION;  Surgeon: Jonnie Kind, MD;  Location: [facility];  Service: Gynecology;  Laterality: N/A;  . ESOPHAGEAL DILATION N/A 04/10/2015   Procedure: ESOPHAGEAL DILATION WITH 54FR MALONEY DILATOR;  Surgeon: Daneil Dolin, MD;  Location: [facility];  Service: Endoscopy;  Laterality: N/A;  . ESOPHAGOGASTRODUODENOSCOPY    . ESOPHAGOGASTRODUODENOSCOPY (EGD) WITH PROPOFOL N/A 04/10/2015   Procedure: ESOPHAGOGASTRODUODENOSCOPY (EGD) WITH PROPOFOL;  Surgeon: Daneil Dolin, MD;  Location: [facility];  Service: Endoscopy;  Laterality: N/A;  . ESOPHAGOGASTRODUODENOSCOPY (EGD) WITH PROPOFOL N/A 12/06/2016   Procedure: ESOPHAGOGASTRODUODENOSCOPY (EGD) WITH PROPOFOL;  Surgeon: Danie Binder, MD;  Location: [facility];  Service: Endoscopy;  Laterality: N/A;  . ESOPHAGOGASTRODUODENOSCOPY (EGD) WITH PROPOFOL N/A 04/27/2019   Procedure: ESOPHAGOGASTRODUODENOSCOPY (EGD) WITH PROPOFOL;  Surgeon: Rogene Houston, MD;  Location: [facility];  Service: Endoscopy;  Laterality: N/A;  730  . ESOPHAGOGASTRODUODENOSCOPY (EGD) WITH PROPOFOL N/A 08/31/2019   Procedure: ESOPHAGOGASTRODUODENOSCOPY (EGD) WITH PROPOFOL;  Surgeon: Rogene Houston, MD;  Location: [facility];  Service: Endoscopy;  Laterality: N/A;  . GAB  2007   in [place]-POUCH 5 CM  . GASTRIC BYPASS  06/2006  . HYSTEROSCOPY WITH D & C N/A 09/12/2014   Procedure: DILATATION AND CURETTAGE /HYSTEROSCOPY;  Surgeon: Jonnie Kind, MD;  Location: [facility];  Service: Gynecology;  Laterality: N/A;  . KYPHOPLASTY N/A 08/02/2018   Procedure: KYPHOPLASTY T12;  Surgeon: Melina Schools, MD;  Location: [place];  Service: Orthopedics;  Laterality: N/A;  60 mins  . LAPAROSCOPIC TUBAL LIGATION Bilateral 03/24/2017   Procedure: LAPAROSCOPIC TUBAL LIGATION ([place] Rings);  Surgeon: Jonnie Kind, MD;  Location: [facility];  Service: Gynecology;  Laterality: Bilateral;  . REPAIR VAGINAL CUFF N/A  07/30/2014   Procedure: REPAIR VAGINAL CUFF;  Surgeon: Mora Bellman, MD;  Location: [place] [facility];  Service: Gynecology;  Laterality: N/A;  . SAVORY DILATION  06/20/2012   Dr. Barnie Alderman gastritis/Ulcer in the mid jejunum. Empiric dilation.   . SURAL NERVE BX Left 02/25/2016   Procedure: LEFT SURAL NERVE BIOPSY;  Surgeon: Jovita Gamma, MD;  Location: [place] [facility];  Service: Neurosurgery;  Laterality: Left;  Left sural nerve biopsy  . TONSILLECTOMY    . TONSILLECTOMY AND ADENOIDECTOMY    . UPPER GASTROINTESTINAL ENDOSCOPY  JULY 2011 NAUSEA-D125,V6, PH 25   Bx; GASTRITIS, POUCH-5 CM LONG  . WISDOM TOOTH EXTRACTION       OB History    Gravida  3   Para  2   Term  1   Preterm  1   AB  1   Living  2     SAB  1   TAB  Ectopic      Multiple      Live Births  2           Family History  Problem Relation Age of Onset  . Hemochromatosis Maternal Grandmother   . Migraines Maternal Grandmother   . Cancer Maternal Grandmother   . Breast cancer Maternal Grandmother   . Hypertension Father   . Diabetes Father   . Coronary artery disease Father   . Migraines Paternal Grandmother   . Breast cancer Paternal Grandmother   . Cancer Mother        breast  . Hemochromatosis Mother   . Breast cancer Mother   . Depression Mother   . Anxiety disorder Mother   . Coronary artery disease Paternal Grandfather   . Anxiety disorder Brother   . Bipolar disorder Brother   . Healthy Daughter   . Healthy Son     Social History   Tobacco Use  . Smoking status: Former Smoker    Packs/day: 0.25    Years: 6.00    Pack years: 1.50    Types: E-cigarettes    Start date: 05/2018  . Smokeless tobacco: Never Used  Substance Use Topics  . Alcohol use: Yes    Alcohol/week: 0.0 standard drinks    Comment: 1-2 beers a couple times a month   . Drug use: Not Currently    Types: Marijuana    Comment: Previously opioid addiction went through rehab    Home Medications Prior to  Admission medications   Medication Sig Start Date End Date Taking? Authorizing Provider  albuterol (PROVENTIL) (2.5 MG/3ML) 0.083% nebulizer solution Take 3 mLs (2.5 mg total) by nebulization every 4 (four) hours as needed for wheezing. 10/08/19   Kathyrn Drown, MD  amphetamine-dextroamphetamine (ADDERALL XR) 30 MG 24 hr capsule TAKE (1) CAPSULE BY MOUTH EVERY DAY. 12/11/19   Cloria Spring, MD  amphetamine-dextroamphetamine (ADDERALL XR) 30 MG 24 hr capsule Take 1 capsule (30 mg total) by mouth daily. 12/11/19   Cloria Spring, MD  budesonide-formoterol [facility]) 160-4.5 MCG/ACT inhaler Inhale 2 puffs into the lungs 2 (two) times daily. 11/14/19   Valentina Shaggy, MD  cetirizine (ZYRTEC) 10 MG tablet Take 1 tablet (10 mg total) by mouth daily. 10/25/19   Valentina Shaggy, MD  clonazePAM (KLONOPIN) 0.5 MG tablet Take 1 tablet (0.5 mg total) by mouth 3 (three) times daily as needed for anxiety. 12/11/19 12/10/20  Cloria Spring, MD  dicyclomine (BENTYL) 10 MG capsule Take by mouth. 10/03/19   [provider]  DULoxetine (CYMBALTA) 60 MG capsule Take 1 capsule (60 mg total) by mouth 2 (two) times daily. 12/11/19   Cloria Spring, MD  fluticasone [facility]) 50 MCG/ACT nasal spray Place 1 spray into both nostrils daily as needed for allergies or rhinitis. 11/14/19 12/14/19  Valentina Shaggy, MD  HYDROmorphone (DILAUDID) 2 MG tablet Take 1-2 tablets po q 4 hrs prn pain no greater than 9 tablets/day 10/15/19   Kathyrn Drown, MD  levalbuterol [facility] HFA) 45 MCG/ACT inhaler Inhale 2 puffs into the lungs every 6 (six) hours as needed for wheezing. 10/25/19   Valentina Shaggy, MD  ondansetron (ZOFRAN-ODT) 4 MG disintegrating tablet Take 1 tablet (4 mg total) by mouth every 8 (eight) hours as needed for nausea or vomiting. 09/20/19   Barton Dubois, MD  pantoprazole (PROTONIX) 40 MG tablet Take 1 tablet (40 mg total) by mouth 2 (two) times daily before a meal. 11/20/19  Kathyrn Drown,  MD  promethazine (PHENERGAN) 25 MG tablet Take one tablet po BID prn nausea 11/20/19   Luking, Scott A, MD  sucralfate (CARAFATE) 1 GM/10ML suspension Take 1 g by mouth 4 (four) times daily. 10/20/19   [provider]  topiramate (TOPAMAX) 50 MG tablet Take 2 tablets (100 mg total) by mouth 2 (two) times daily. 12/11/19   Cloria Spring, MD  traZODone (DESYREL) 100 MG tablet Take 1 tablet (100 mg total) by mouth at bedtime. 12/11/19   Cloria Spring, MD    Allergies    Gabapentin, Metoclopramide hcl, Tramadol, Ativan [lorazepam], Latex, Lyrica [pregabalin], Trazodone and nefazodone, Zofran [ondansetron], Sulfa antibiotics, Butrans [buprenorphine], Nucynta [tapentadol], Propofol, Tape, and Toradol [ketorolac tromethamine]  Review of Systems   Review of Systems  Constitutional: Negative.   HENT: Negative.   Respiratory: Negative.   Cardiovascular: Negative.   Gastrointestinal: Positive for abdominal pain, constipation, nausea and vomiting. Negative for abdominal distention, anal bleeding, blood in stool, diarrhea and rectal pain.  Genitourinary: Negative.   Musculoskeletal: Negative.   Skin: Negative.   Neurological: Negative.   All other systems reviewed and are negative.   Physical Exam Updated Vital Signs BP 129/89 (BP Location: Left Arm)   Pulse (!) 126   Temp 97.7 F (36.5 C) (Oral)   Resp 14   Ht '5\' 6"'$  (1.676 m)   Wt 83.9 kg   SpO2 100%   BMI 29.86 kg/m   Physical Exam Vitals and nursing note reviewed.  Constitutional:      General: She is not in acute distress.    Appearance: She is well-developed. She is not ill-appearing, toxic-appearing or diaphoretic.  HENT:     Head: Normocephalic and atraumatic.     Mouth/Throat:     Mouth: Mucous membranes are moist.  Eyes:     Pupils: Pupils are equal, round, and reactive to light.  Cardiovascular:     Rate and Rhythm: Normal rate.     Heart sounds: Normal heart sounds.  Pulmonary:     Effort: Pulmonary effort  is normal. No respiratory distress.     Breath sounds: Normal breath sounds.  Abdominal:     General: There is no distension.     Palpations: Abdomen is soft.     Tenderness: There is generalized abdominal tenderness and tenderness in the right upper quadrant, epigastric area and left upper quadrant. There is guarding. There is no right CVA tenderness, left CVA tenderness or rebound.     Hernia: No hernia is present.     Comments: Diffuse tenderness palpation worse to left and right upper quadrant.  No rebound but there is some guarding.  Genitourinary:    Rectum: Normal. No mass, tenderness, anal fissure, external hemorrhoid or internal hemorrhoid. Normal anal tone.     Comments: No fecal impaction, light brown stool in rectal vault.  No external or internal hemorrhoids noted.  No anal fissure. Female RN in room during exam. Musculoskeletal:        General: Normal range of motion.     Cervical back: Normal range of motion.     Comments: Moves all 4 extremities without difficulty.  Skin:    General: Skin is warm and dry.     Capillary Refill: Capillary refill takes less than 2 seconds.     Comments: Brisk cap refill.  Neurological:     Mental Status: She is alert.     Sensory: Sensation is intact.     Motor: Motor  function is intact.     Coordination: Coordination is intact.     Gait: Gait is intact.     Comments: Ambulatory without difficulty.    ED Results / Procedures / Treatments   Labs (all labs ordered are listed, but only abnormal results are displayed) Labs Reviewed  COMPREHENSIVE METABOLIC PANEL - Abnormal; Notable for the following components:      Result Value   Glucose, Bld 108 (*)    All other components within normal limits  CULTURE, BLOOD (ROUTINE X 2)  CULTURE, BLOOD (ROUTINE X 2)  LIPASE, BLOOD  CBC  LACTIC ACID, PLASMA  URINALYSIS, ROUTINE W REFLEX MICROSCOPIC  LACTIC ACID, PLASMA  POC URINE PREG, ED  I-STAT BETA HCG BLOOD, ED ([facility], [facility], [facility] ONLY)     EKG None  Radiology No results found.  Procedures Procedures (including critical care time)  Medications Ordered in ED Medications  sodium chloride flush (NS) 0.9 % injection 3 mL (has no administration in time range)  sodium chloride 0.9 % bolus 1,000 mL (has no administration in time range)  promethazine (PHENERGAN) injection 25 mg (has no administration in time range)  HYDROmorphone (DILAUDID) injection 1 mg (has no administration in time range)   ED Course  I have reviewed the triage vital signs and the nursing notes.  Pertinent labs & imaging results that were available during my care of the patient were reviewed by me and considered in my medical decision making (see chart for details).  34 year old female presents for evaluation abdominal pain, nausea vomiting and constipation.  She is afebrile, nonseptic, non-ill-appearing.  Complex medical history followed by [facility].  Recently had stent removed from gallbladder for sphincter of Oddi dysfunction.  Patient takes significant medical opiates at home, Dilaudid 3 times daily as well as Phenergan and Zofran. PMP overdose score of 670. Per G[facility] note from [facility] there is thought to be some functional component contributing to her chronic pain as they have not been able to completely figure out why she has chronic abdominal pain and patient spends extended time during interview on narcotic pain meds. She is mildly tachycardic in room however afebrile, nonseptic, non-ill-appearing.  Heart and lungs clear.  Abdomen diffusely tender without rebound however there is some guarding.  Denies any urinary symptoms.  States she also cannot member the last time she had a bowel movement.  We will plan for labs, IV fluids, given recent surgery possibly obtain CT scan to ensure no complications as well as rectal exam to assess for infection. Tachycardic however appears anxious.  GU exam without impacted stool. Light brown stool in rectal vault. Labs and  imaging personally reviewed and interpreted:  Clinical Course as of Jan 02 2129  Thu Jan 03, 2020  2057 No leukocystosis  CBC [BH]  2109 Lipase 15  Lipase, blood [BH]  2109 Mildly elevated glucose, no additional electrolyte, renal or liver abnormality.  Comprehensive metabolic panel(!) [BH]  5809 0.6  Lactic acid, plasma [BH]    Clinical Course User Index [BH] Ethaniel Garfield A, PA-C   Care transferred to [facility], PA-C who will follow up on remaining labs and imaging. She will determine ultimate disposition of patient. If CT scan shows constipation patient may need enema in ED.  Plan if neg CT [facility] and able to tolerate PO can dc home on her home pain medications and antiemetics with close FU with [facility].  If dc home I do not believe she needs additional narcotic pain meds at dc. High  overdose score and has script for #120 Dilaudid and Norco at home.  MDM Rules/Calculators/A&P                       Final Clinical Impression(s) / ED Diagnoses Final diagnoses:  Pain of upper abdomen  Nausea and vomiting, intractability of vomiting not specified, unspecified vomiting type    Rx / DC Orders ED Discharge Orders    None       Gerlene Glassburn A, PA-C 01/03/20 2130    Maudie Flakes, MD 01/07/20 [phone]

## 2020-01-03 NOTE — ED Triage Notes (Signed)
Pt with abd pain since stents removed from gallbladder on 12/20/19 at [facility]. Pt c/o frequent belching, nausea and emesis.

## 2020-01-03 NOTE — Discharge Instructions (Addendum)
Your urinalysis did not show any signs of a urinary tract infection.   You were given medication for constipation. Please take as directed.   Please follow up with your gastroenterology doctor within the next 5-7 days.  Please return to the ER sooner if you have any new or worsening symptoms, or if you have any of the following symptoms:  Abdominal pain that does not go away.  You have a fever.  You keep throwing up (vomiting).  The pain is felt only in portions of the abdomen. Pain in the right side could possibly be appendicitis. In an adult, pain in the left lower portion of the abdomen could be colitis or diverticulitis.  You pass bloody or black tarry stools.  There is bright red blood in the stool.  The constipation stays for more than 4 days.  There is belly (abdominal) or rectal pain.  You do not seem to be getting better.  You have any questions or concerns.

## 2020-01-08 LAB — CULTURE, BLOOD (ROUTINE X 2)
Culture: NO GROWTH
Special Requests: ADEQUATE

## 2020-01-10 DIAGNOSIS — K834 Spasm of sphincter of Oddi: Secondary | ICD-10-CM | POA: Diagnosis not present

## 2020-01-21 DIAGNOSIS — M62830 Muscle spasm of back: Secondary | ICD-10-CM | POA: Diagnosis not present

## 2020-01-21 DIAGNOSIS — G8929 Other chronic pain: Secondary | ICD-10-CM | POA: Diagnosis not present

## 2020-01-21 DIAGNOSIS — Z79899 Other long term (current) drug therapy: Secondary | ICD-10-CM | POA: Diagnosis not present

## 2020-01-21 DIAGNOSIS — M5441 Lumbago with sciatica, right side: Secondary | ICD-10-CM | POA: Diagnosis not present

## 2020-01-21 DIAGNOSIS — M5442 Lumbago with sciatica, left side: Secondary | ICD-10-CM | POA: Diagnosis not present

## 2020-01-25 DIAGNOSIS — M5441 Lumbago with sciatica, right side: Secondary | ICD-10-CM | POA: Diagnosis not present

## 2020-01-25 DIAGNOSIS — Z79899 Other long term (current) drug therapy: Secondary | ICD-10-CM | POA: Diagnosis not present

## 2020-01-25 DIAGNOSIS — E559 Vitamin D deficiency, unspecified: Secondary | ICD-10-CM | POA: Diagnosis not present

## 2020-01-25 DIAGNOSIS — Z20822 Contact with and (suspected) exposure to covid-19: Secondary | ICD-10-CM | POA: Diagnosis not present

## 2020-01-25 DIAGNOSIS — M546 Pain in thoracic spine: Secondary | ICD-10-CM | POA: Diagnosis not present

## 2020-01-25 DIAGNOSIS — G8929 Other chronic pain: Secondary | ICD-10-CM | POA: Diagnosis not present

## 2020-01-25 DIAGNOSIS — M5442 Lumbago with sciatica, left side: Secondary | ICD-10-CM | POA: Diagnosis not present

## 2020-02-01 ENCOUNTER — Ambulatory Visit: Payer: Medicaid Other | Admitting: Neurology

## 2020-02-04 ENCOUNTER — Other Ambulatory Visit (HOSPITAL_COMMUNITY): Payer: Self-pay | Admitting: Psychiatry

## 2020-02-06 ENCOUNTER — Other Ambulatory Visit: Payer: Self-pay | Admitting: Family Medicine

## 2020-02-07 DIAGNOSIS — G8929 Other chronic pain: Secondary | ICD-10-CM | POA: Diagnosis not present

## 2020-02-07 DIAGNOSIS — R109 Unspecified abdominal pain: Secondary | ICD-10-CM | POA: Diagnosis not present

## 2020-02-11 ENCOUNTER — Ambulatory Visit (HOSPITAL_COMMUNITY): Payer: Medicaid Other | Admitting: Psychiatry

## 2020-02-14 ENCOUNTER — Ambulatory Visit (INDEPENDENT_AMBULATORY_CARE_PROVIDER_SITE_OTHER): Payer: Medicaid Other | Admitting: Psychiatry

## 2020-02-14 ENCOUNTER — Encounter (HOSPITAL_COMMUNITY): Payer: Self-pay | Admitting: Psychiatry

## 2020-02-14 ENCOUNTER — Other Ambulatory Visit: Payer: Self-pay

## 2020-02-14 DIAGNOSIS — F332 Major depressive disorder, recurrent severe without psychotic features: Secondary | ICD-10-CM | POA: Diagnosis not present

## 2020-02-14 DIAGNOSIS — F411 Generalized anxiety disorder: Secondary | ICD-10-CM

## 2020-02-14 DIAGNOSIS — F900 Attention-deficit hyperactivity disorder, predominantly inattentive type: Secondary | ICD-10-CM | POA: Diagnosis not present

## 2020-02-14 MED ORDER — TRAZODONE HCL 150 MG PO TABS: 150.0000 mg | ORAL_TABLET | Freq: Every day | ORAL | 2 refills | Status: DC

## 2020-02-14 MED ORDER — AMPHETAMINE-DEXTROAMPHET ER 30 MG PO CP24: ORAL_CAPSULE | ORAL | 0 refills | Status: DC

## 2020-02-14 MED ORDER — AMPHETAMINE-DEXTROAMPHET ER 30 MG PO CP24: 30.0000 mg | ORAL_CAPSULE | Freq: Every day | ORAL | 0 refills | Status: DC

## 2020-02-14 MED ORDER — TOPIRAMATE 100 MG PO TABS: 100.0000 mg | ORAL_TABLET | Freq: Two times a day (BID) | ORAL | 2 refills | Status: DC

## 2020-02-14 MED ORDER — DULOXETINE HCL 60 MG PO CPEP: 60.0000 mg | ORAL_CAPSULE | Freq: Two times a day (BID) | ORAL | 2 refills | Status: DC

## 2020-02-14 MED ORDER — CLONAZEPAM 0.5 MG PO TABS: 0.5000 mg | ORAL_TABLET | Freq: Three times a day (TID) | ORAL | 2 refills | Status: DC | PRN

## 2020-02-14 NOTE — Progress Notes (Signed)
Virtual Visit via Telephone Note  I connected with Penny [name] on 02/14/20 at  2:40 PM EST by telephone and verified that I am speaking with the correct person using two identifiers.   I discussed the limitations, risks, security and privacy concerns of performing an evaluation and management service by telephone and the availability of in person appointments. I also discussed with the patient that there may be a patient responsible charge related to this service. The patient expressed understanding and agreed to proceed.    I discussed the assessment and treatment plan with the patient. The patient was provided an opportunity to ask questions and all were answered. The patient agreed with the plan and demonstrated an understanding of the instructions.   The patient was advised to call back or seek an in-person evaluation if the symptoms worsen or if the condition fails to improve as anticipated.  I provided 15 minutes of non-face-to-face time during this encounter.   [name], MD  [facility] MD/PA/NP OP Progress Note  02/14/2020 3:13 PM Penny [name]  MRN:  [id number]  Chief Complaint:  Chief Complaint    Anxiety; Depression; Follow-up     HPI: This patient is a 34 year old female who is living with her boyfriend her daughter and son as well as her boyfriend's 2 daughters in [place].  She is currently not working and has applied for disability.  The patient returns for follow-up after 2 months from treatment of mood swings anxiety depression and ADHD.  She is still having difficulties with chronic abdominal pain and is being closely followed by [facility].  She is supposed to start on Amitiza but it was not approved by insurance and she is also supposed to start pelvic floor PT.  Today she is focused on her anxiety and wants an increase in clonazepam or change to another med I explained to her in no uncertain terms and I am not willing to do this and I have explained this  before.  Of all the benzodiazepines clonazepam has a least addiction potential and I am not going to increase the dosage given her history of medication overuse.  I was willing to go up on her Topamax to 100 mg twice daily until she can be seen by neurology because she is having headaches.  She states that her sleep is not very good so we will increase trazodone to 150 mg at bedtime.  She states in terms of mood she is having good and bad days but overall her mood has been stable.  She was supposed to see the new therapist in our office and missed the appointment and I explained that not everything can be fixed with medication and she is going to have to learn some anxiety management. Visit Diagnosis:    ICD-10-CM   1. Major depressive disorder, recurrent episode, 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[place])  F33.2   2. GAD (generalized anxiety disorder)  F41.1   3. Attention deficit hyperactivity disorder (ADHD), predominantly inattentive type  F90.0     Past Psychiatric History: Psychiatric hospitalization 2 years ago for depression  Past Medical History:  Past Medical History:  Diagnosis Date  . ADD (attention deficit disorder)   . Anemia 2011   2o to GASTRIC BYPASS  . Anginal pain ([place])   . Anxiety   . Blood transfusion without reported diagnosis   . Chronic daily headache   . Depression   . Depression   . Dysmenorrhea 09/18/2013  . Dysrhythmia   .  Elevated liver enzymes JUL 2011ALK PHOS 111-127 AST  143-267 ALT  213-321T BILI 0.6  ALB  3.7-4.06 Jun 2011 ALK PHOS 118 AST 24 ALT 42 T BILI 0.4 ALB 3.9  . Encounter for drug rehabilitation    at behavioral health for opioid addiction about 4 years ago  . Family history of adverse reaction to anesthesia    'dad had to be kept on pump for breathing for morphine'  . Fatty liver   . Fibroid 01/18/2017  . Fibromyalgia   . Gastric bypass status for obesity   . Gastritis JULY 2011  . Heart murmur   . Hereditary and idiopathic peripheral  neuropathy 01/15/2015  . History of Holter monitoring   . Hx of opioid abuse ([place])    for about 4 years, about 4 years ago  . Interstitial cystitis   . Iron deficiency anemia 07/23/2010  . Irritable bowel syndrome 2012 DIARRHEA   JUN 2012 TTG IgA 14.9  . IUD FEB 2010  . Lupus ([place])   . Menorrhagia 07/18/2013  . Migraines   . Obesity (BMI 30-39.9) 2011 228 LBS BMI 36.8  . Ovarian cyst   . Patient desires pregnancy 09/18/2013  . Polyneuropathy   . PONV (postoperative nausea and vomiting) seizure post-operatively   seizure following ankle surgery  . Potassium (K) deficiency   . Pregnant 12/25/2013  . Psychiatric pseudoseizure   . RLQ abdominal pain 07/18/2013  . Sciatica of left side 11/14/2014  . 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 Stress 09/18/2013    Past Surgical History:  Procedure Laterality Date  . BIOPSY  04/10/2015   Procedure: BIOPSY;  Surgeon: Daneil Dolin, MD;  Location: [facility];  Service: Endoscopy;;  . cath self every nite     for sodium bicarb injection (discontinued 2013)  . CHOLECYSTECTOMY  2005   biliary dyskinesia  . COLONOSCOPY  JUN 2012 ABD PN/DIARRHEA WITH PROPOFOL   NL COLON  . DILATION AND CURETTAGE OF UTERUS    . DILITATION & CURRETTAGE/HYSTROSCOPY WITH NOVASURE ABLATION N/A 03/24/2017   Procedure: DILATATION & CURETTAGE/HYSTEROSCOPY WITH NOVASURE ENDOMETRIAL ABLATION;  Surgeon: Jonnie Kind, MD;  Location: [facility];  Service: Gynecology;  Laterality: N/A;  . ESOPHAGEAL DILATION N/A 04/10/2015   Procedure: ESOPHAGEAL DILATION WITH 54FR MALONEY DILATOR;  Surgeon: Daneil Dolin, MD;  Location: [facility];  Service: Endoscopy;  Laterality: N/A;  . ESOPHAGOGASTRODUODENOSCOPY    . ESOPHAGOGASTRODUODENOSCOPY (EGD) WITH PROPOFOL N/A 04/10/2015   Procedure: ESOPHAGOGASTRODUODENOSCOPY (EGD) WITH PROPOFOL;  Surgeon: Daneil Dolin, MD;  Location: [facility];  Service: Endoscopy;  Laterality: N/A;  . ESOPHAGOGASTRODUODENOSCOPY (EGD) WITH PROPOFOL N/A 12/06/2016    Procedure: ESOPHAGOGASTRODUODENOSCOPY (EGD) WITH PROPOFOL;  Surgeon: Danie Binder, MD;  [facility];  Service: Endoscopy;  Laterality: N/A;  . ESOPHAGOGASTRODUODENOSCOPY (EGD) WITH PROPOFOL N/A 04/27/2019   Procedure: ESOPHAGOGASTRODUODENOSCOPY (EGD) WITH PROPOFOL;  Surgeon: Rogene Houston, MD;  [facility];  Service: Endoscopy;  Laterality: N/A;  730  . ESOPHAGOGASTRODUODENOSCOPY (EGD) WITH PROPOFOL N/A 08/31/2019   Procedure: ESOPHAGOGASTRODUODENOSCOPY (EGD) WITH PROPOFOL;  Surgeon: Rogene Houston, MD;  [facility];  Service: Endoscopy;  Laterality: N/A;  . GAB  2007   in [place]-POUCH 5 CM  . GASTRIC BYPASS  06/2006  . HYSTEROSCOPY WITH D & C N/A 09/12/2014   Procedure: DILATATION AND CURETTAGE /HYSTEROSCOPY;  Surgeon: Jonnie Kind, MD;  Location: [facility];  Service: Gynecology;  Laterality: N/A;  .  KYPHOPLASTY N/A 08/02/2018   Procedure: KYPHOPLASTY T12;  Surgeon: Melina Schools, MD;  Location: [facility];  Service: Orthopedics;  Laterality: N/A;  60 mins  . LAPAROSCOPIC TUBAL LIGATION Bilateral 03/24/2017   Procedure: LAPAROSCOPIC TUBAL LIGATION ([place] Rings);  Surgeon: Jonnie Kind, MD;  Location: [facility];  Service: Gynecology;  Laterality: Bilateral;  . REPAIR VAGINAL CUFF N/A 07/30/2014   Procedure: REPAIR VAGINAL CUFF;  Surgeon: Mora Bellman, MD;  Location: [facility];  Service: Gynecology;  Laterality: N/A;  . SAVORY DILATION  06/20/2012   Dr. Barnie Alderman gastritis/Ulcer in the mid jejunum. Empiric dilation.   . SURAL NERVE BX Left 02/25/2016   Procedure: LEFT SURAL NERVE BIOPSY;  Surgeon: Jovita Gamma, MD;  Location: [facility];  Service: Neurosurgery;  Laterality: Left;  Left sural nerve biopsy  . TONSILLECTOMY    . TONSILLECTOMY AND ADENOIDECTOMY    . UPPER GASTROINTESTINAL ENDOSCOPY  JULY 2011 NAUSEA-D125,V6, PH 25   Bx; GASTRITIS, POUCH-5 CM LONG  . WISDOM TOOTH EXTRACTION      Family Psychiatric History: See below  Family History:   Family History  Problem Relation Age of Onset  . Hemochromatosis Maternal Grandmother   . Migraines Maternal Grandmother   . Cancer Maternal Grandmother   . Breast cancer Maternal Grandmother   . Hypertension Father   . Diabetes Father   . Coronary artery disease Father   . Migraines Paternal Grandmother   . Breast cancer Paternal Grandmother   . Cancer Mother        breast  . Hemochromatosis Mother   . Breast cancer Mother   . Depression Mother   . Anxiety disorder Mother   . Coronary artery disease Paternal Grandfather   . Anxiety disorder Brother   . Bipolar disorder Brother   . Healthy Daughter   . Healthy Son     Social History:  Social History   Socioeconomic History  . Marital status: Divorced    Spouse name: Not on file  . Number of children: 2  . Years of education: some colle  . Highest education level: Not on file  Occupational History    Employer: NOT EMPLOYED  Tobacco Use  . Smoking status: Former Smoker    Packs/day: 0.25    Years: 6.00    Pack years: 1.50    Types: E-cigarettes    Start date: 05/2018  . Smokeless tobacco: Never Used  Substance and Sexual Activity  . Alcohol use: Yes    Alcohol/week: 0.0 standard drinks    Comment: 1-2 beers a couple times a month   . Drug use: Not Currently    Types: Marijuana    Comment: Previously opioid addiction went through rehab  . Sexual activity: Yes    Partners: Male    Birth control/protection: Surgical, Condom    Comment: tubal and ablation  Other Topics Concern  . Not on file  Social History Narrative   Patient is right handed.   Patient drinks one cup of coffee daily.   She lives in a one-story home with her two children (60 year old son, 77 month old daughter).   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 Difficulty of Paying Living Expenses:   Food Insecurity:   . Worried About  Charity fundraiser in the Last Year:   . [place] in the  Last Year:   Transportation Needs:   . Film/video editor (Medical):   Marland Kitchen Lack of Transportation (Non-Medical):   Physical Activity:   . Days of Exercise per Week:   . Minutes of Exercise per Session:   Stress:   . Feeling of Stress :   Social Connections:   . Frequency of Communication with Friends and Family:   . Frequency of Social Gatherings with Friends and Family:   . Attends Religious Services:   . Active Member of Clubs or Organizations:   . Attends Archivist Meetings:   Marland Kitchen Marital Status:     Allergies:  Allergies  Allergen Reactions  . Gabapentin Other (See Comments)    Pt states that she was unresponsive after taking this medication, but her vitals remained stable.    . Metoclopramide Hcl Anxiety and Other (See Comments)    Pt states that she felt like she was trapped in a box, and could not get out.  Pt also states that she had temporary loss of movement, weakness, and tingling.    . Tramadol Other (See Comments)    Seizures  . Ativan [Lorazepam] Other (See Comments)    combative  . Latex Itching, Rash and Other (See Comments)    Burns  . Lyrica [Pregabalin] Other (See Comments)    suicidal  . Trazodone And Nefazodone Other (See Comments)    Makes pt "like a zombie"  . Zofran [Ondansetron] Other (See Comments)    Migraines in high doses    . Sulfa Antibiotics Hives  . Butrans [Buprenorphine] Rash    Patch caused rash, didn't help with pain  . Nucynta [Tapentadol] Nausea And Vomiting  . Propofol Nausea And Vomiting  . Tape Itching, Rash and Other (See Comments)    Please use paper tape  . Toradol [Ketorolac Tromethamine] Anxiety    Metabolic Disorder Labs: Lab Results  Component Value Date   HGBA1C 5.7 (H) 01/15/2015   Lab Results  Component Value Date   PROLACTIN 80.0 (H) 05/09/2015   Lab Results  Component Value Date   CHOL 95 03/10/2012   TRIG 50 03/10/2012   HDL  38 (L) 03/10/2012   CHOLHDL 2.5 03/10/2012   VLDL 10 03/10/2012   LDLCALC 47 03/10/2012   Lab Results  Component Value Date   TSH 2.616 08/25/2019   TSH 1.592 05/25/2019    Therapeutic Level Labs: No results found for: LITHIUM No results found for: VALPROATE No components found for:  CBMZ  Current Medications: Current Outpatient Medications  Medication Sig Dispense Refill  . albuterol (PROVENTIL) (2.5 MG/3ML) 0.083% nebulizer solution Take 3 mLs (2.5 mg total) by nebulization every 4 (four) hours as needed for wheezing. 150 mL 12  . amphetamine-dextroamphetamine (ADDERALL XR) 30 MG 24 hr capsule TAKE (1) CAPSULE BY MOUTH EVERY DAY. 30 capsule 0  . amphetamine-dextroamphetamine (ADDERALL XR) 30 MG 24 hr capsule Take 1 capsule (30 mg total) by mouth daily. 30 capsule 0  . budesonide-formoterol (SYMBICORT) 160-4.5 MCG/ACT inhaler Inhale 2 puffs into the lungs 2 (two) times daily. 1 Inhaler 5  . cetirizine (ZYRTEC) 10 MG tablet Take 1 tablet (10 mg total) by mouth daily. 30 tablet 5  . clonazePAM (KLONOPIN) 0.5 MG tablet Take 1 tablet (0.5 mg total) by mouth 3 (three) times daily as needed for anxiety. 90 tablet 2  . dicyclomine (BENTYL) 10 MG capsule Take by mouth.    . DULoxetine (CYMBALTA) 60 MG capsule Take 1 capsule (60 mg total) by  mouth 2 (two) times daily. 180 capsule 2  . fluticasone (FLONASE) 50 MCG/ACT nasal spray Place 1 spray into both nostrils daily as needed for allergies or rhinitis. 9.9 mL 2  . HYDROmorphone (DILAUDID) 2 MG tablet Take 1-2 tablets po q 4 hrs prn pain no greater than 9 tablets/day 65 tablet 0  . levalbuterol (XOPENEX HFA) 45 MCG/ACT inhaler Inhale 2 puffs into the lungs every 6 (six) hours as needed for wheezing. 1 Inhaler 5  . ondansetron (ZOFRAN-ODT) 4 MG disintegrating tablet Take 1 tablet (4 mg total) by mouth every 8 (eight) hours as needed for nausea or vomiting. 20 tablet 0  . pantoprazole (PROTONIX) 40 MG tablet Take 1 tablet (40 mg total) by mouth  2 (two) times daily before a meal. 60 tablet 5  . promethazine (PHENERGAN) 25 MG tablet TAKE (1) TABLET BY MOUTH TWICE A DAY AS NEEDED. 14 tablet 0  . sucralfate (CARAFATE) 1 GM/10ML suspension Take 1 g by mouth 4 (four) times daily.    Marland Kitchen topiramate (TOPAMAX) 50 MG tablet Take 2 tablets (100 mg total) by mouth 2 (two) times daily. 60 tablet 2   No current facility-administered medications for this visit.     Musculoskeletal: Strength & Muscle Tone: within normal limits Gait & Station: normal Patient leans: N/A  Psychiatric Specialty Exam: Review of Systems  Gastrointestinal: Positive for abdominal pain and constipation.  Neurological: Positive for headaches.  Psychiatric/Behavioral: Positive for sleep disturbance. The patient is nervous/anxious.   All other systems reviewed and are negative.   There were no vitals taken for this visit.There is no height or weight on file to calculate BMI.  General Appearance: NA  Eye Contact:  NA  Speech:  Clear and Coherent  Volume:  Normal  Mood:  Anxious  Affect:  NA  Thought Process:  Goal Directed  Orientation:  Full (Time, Place, and Person)  Thought Content: Rumination   Suicidal Thoughts:  No  Homicidal Thoughts:  No  Memory:  Immediate;   Good Recent;   Good Remote;   Good  Judgement:  Poor  Insight:  Shallow  Psychomotor Activity:  Decreased  Concentration:  Concentration: Good and Attention Span: Good  Recall:  Good  Fund of Knowledge: Good  Language: Good  Akathisia:  No  Handed:  Right  AIMS (if indicated): not done  Assets:  Communication Skills Desire for Improvement Resilience Social Support Talents/Skills  ADL's:  Intact  Cognition: WNL  Sleep:  Poor   Screenings: AUDIT     ED to Hosp-Admission (Discharged) from 05/09/2015 in [facility]  Alcohol Use Disorder Identification Test Final Score (AUDIT)  0    GAD-7     Counselor from 12/11/2018 in [facility]or from 02/07/2017 in [facility]  Total GAD-7 Score  19  17    PHQ2-9     Counselor from 12/11/2018 in [facility] Counselor from 07/22/2017 in [facility] Counselor from 02/07/2017 in [facility] Office Visit from 01/10/2017 in [facility] Counselor from 10/06/2016 in [facility]  PHQ-2 Total Score  _0 PHQ-9 Total Score  _1 Assessment and Plan: This patient is a 34 year old female with a history of depression anxiety and ADHD.  She still is asking  for an increase in benzodiazepines and I absolutely cannot agree to this given her history.  I explained to her that if she is not compliant with therapy that we arranged that we will have to discharge her from our clinic.  She will continue clonazepam at 0.5 mg 3 times daily.  She will increase trazodone to 150 mg at bedtime for sleep.  She will continue Cymbalta 60 mg twice daily for depression, Adderall XR 30 mg every morning for focus and Topamax 100 mg twice daily for mood stabilization and headache.  She will return to see me in 2 months and we will attempt to get her rescheduled for counseling.   [name], MD 02/14/2020, 3:13 PM

## 2020-02-15 ENCOUNTER — Encounter: Payer: Self-pay | Admitting: Allergy & Immunology

## 2020-02-15 ENCOUNTER — Other Ambulatory Visit: Payer: Self-pay

## 2020-02-15 ENCOUNTER — Ambulatory Visit: Payer: Medicaid Other | Admitting: Allergy & Immunology

## 2020-02-15 VITALS — BP 108/64 | HR 98 | Temp 98.7°F | Resp 19

## 2020-02-15 DIAGNOSIS — J4540 Moderate persistent asthma, uncomplicated: Secondary | ICD-10-CM | POA: Diagnosis not present

## 2020-02-15 DIAGNOSIS — J302 Other seasonal allergic rhinitis: Secondary | ICD-10-CM

## 2020-02-15 DIAGNOSIS — J3089 Other allergic rhinitis: Secondary | ICD-10-CM

## 2020-02-15 DIAGNOSIS — Z79899 Other long term (current) drug therapy: Secondary | ICD-10-CM | POA: Diagnosis not present

## 2020-02-15 DIAGNOSIS — M5442 Lumbago with sciatica, left side: Secondary | ICD-10-CM | POA: Diagnosis not present

## 2020-02-15 DIAGNOSIS — M5441 Lumbago with sciatica, right side: Secondary | ICD-10-CM | POA: Diagnosis not present

## 2020-02-15 DIAGNOSIS — G8929 Other chronic pain: Secondary | ICD-10-CM | POA: Diagnosis not present

## 2020-02-15 MED ORDER — MONTELUKAST SODIUM 10 MG PO TABS: 10.0000 mg | ORAL_TABLET | Freq: Every day | ORAL | 5 refills | Status: DC

## 2020-02-15 NOTE — Progress Notes (Signed)
FOLLOW UP  Date of Service/Encounter:  02/15/20   Assessment:   Moderate persistent asthma, uncomplicated  Perennial and seasonal allergic rhinitis (grasses, cockroach, trees, ragweed, and trees)  Migraines - worsening in frequency and intensity  Complicated past medical history, including fibromyalgia, anxiety and depression, chronic abdominal pain, seizures and pseudoseizures, drug abuse, and gastric bypass  Plan/Recommendations:   1. Moderate persistent asthma, uncomplicated - Lung testing looks amazing. - I agree that your shortness of breath episodes are related to pollen exposure from the dogs. - Daily controller medication(s): Symbicort 160/4.60mcg two puffs twice daily with spacer - Prior to physical activity: Xopenex 2 puffs 10-15 minutes before physical activity. - Rescue medications: Xopenex 4 puffs every 4-6 hours as needed - Asthma control goals:  * Full participation in all desired activities (may need albuterol before activity) * Albuterol use two time or less a week on average (not counting use with activity) * Cough interfering with sleep two time or less a month * Oral steroids no more than once a year * No hospitalizations  2. Chronic rhinitis (grasses, cockroach, trees, ragweed, and trees) - Continue taking: Zyrtec (cetirizine) 10mg  tablet once daily and Flonase (fluticasone) one spray per nostril daily  - We are adding on Singulair 10mg  daily to help with your allergy symptoms. - Get a HEPA filter for the bedroom to keep the pollen/dander load down.  - You can use an extra dose of the antihistamine, if needed, for breakthrough symptoms.  - Consider nasal saline rinses 1-2 times daily to remove allergens from the nasal cavities as well as help with mucous clearance (this is especially helpful to do before the nasal sprays are given)  3. Return in about 3 months (around 05/17/2020). This can be an in-person, a virtual Webex or a telephone follow up  visit.   Subjective:   Penny [name] is a 34 y.o. female presenting today for follow up of  Chief Complaint  Patient presents with  . Asthma    Penny [name] has a history of the following: Patient Active Problem List   Diagnosis Date Noted  . Dehydration   . Intractable abdominal pain 09/19/2019  . Intractable nausea and vomiting 09/19/2019  . Gastroesophageal reflux disease without esophagitis   . Cannabis abuse 08/30/2019  . Dyspnea   . Severe anxiety 08/21/2019  . Hematemesis with nausea 04/23/2019  . Absolute anemia 04/23/2019  . Urge incontinence 10/26/2018  . S/P kyphoplasty 08/02/2018  . Herpes genitalis in women 12/19/2017  . Abnormal urine odor 03/30/2017  . Urinary tract infection with hematuria 03/30/2017  . Screening for genitourinary condition 03/30/2017  . Pain 03/30/2017  . Burning with urination 03/30/2017  . Family history of breast cancer in mother,uncertain BR CA status 02/28/2017  . Fibroid 01/18/2017  . Chronic pain syndrome 12/05/2016  . Folate deficiency 12/04/2016  . Nausea vomiting and diarrhea 12/04/2016  . Severe anemia 12/04/2016  . Symptomatic anemia 12/03/2016  . Complaints of leg weakness   . Paresthesia   . Left-sided weakness   . Uncontrolled pain 01/24/2016  . Malnutrition of moderate degree 01/24/2016  . Neuropathy 01/23/2016  . Inability to walk 01/23/2016  . Paresthesias   . Bilateral leg numbness 11/26/2015  . 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 Post partum depression 05/09/2015  . Anastomotic ulcer   . Dysphagia   . Hematemesis 04/07/2015  . Intractable vomiting with nausea 04/07/2015  . Elevated liver enzymes   .  Epigastric pain   . Pseudoseizure ([place]) 02/22/2015  . Seizure-like activity ([place]) 02/22/2015  . Fibromyalgia 02/18/2015  . Vulvar fissure 02/18/2015  . Clinical depression 02/01/2015  . Current smoker 01/29/2015  . Current tobacco  use 01/29/2015  . 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 Leg weakness, bilateral 01/02/2015  . Gait disorder 01/02/2015  . Other symptoms and signs involving the musculoskeletal system 01/02/2015  . Abnormal gait 01/02/2015  . Cervicalgia 12/18/2014  . Sciatica of left side 11/14/2014  . Neuralgia neuritis, sciatic nerve 11/14/2014  . 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 Encounter for sterilization 09/09/2014  . Abdominal pain, acute, right lower quadrant 08/22/2014  . Headache, migraine 08/19/2014  . Seizure ([place]) 08/18/2014  . Encephalopathy 08/13/2014  . Headache 08/13/2014  . Altered mental status 08/13/2014  . Right leg numbness 07/31/2014  . Disturbance of skin sensation 07/31/2014  . Hypertension in pregnancy, transient 07/08/2014  . Previous gastric bypass affecting pregnancy, antepartum 05/22/2014  . 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 ASD (atrial septal defect), ostium secundum 05/17/2014  . Depression complicating pregnancy in second trimester, antepartum 05/09/2014  . Antepartum mental disorder in pregnancy 05/09/2014  . Rapid palpitations 02/18/2014  . H/O maternal third degree perineal laceration, currently pregnant 02/18/2014  . High-risk pregnancy 02/18/2014  . Supervision of pregnancy with other poor reproductive or obstetric history, unspecified trimester 02/18/2014  . Restless legs 01/16/2014  . Restless leg 01/16/2014  . Chronic interstitial cystitis 10/23/2013  . Menorrhagia 07/18/2013  . Excessive and frequent menstruation 07/18/2013  . h/o Opiate addiction 03/11/2012    Class: Acute  . History of migraine headaches 03/10/2012    Class: Acute  . Depression with anxiety 03/10/2012    Class: Chronic  . 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 Panic disorder without agoraphobia  03/10/2012  . H/O disease 03/10/2012  . Dysthymia 03/10/2012  . Pelvic congestion syndrome 10/13/2011  . Coitalgia 10/13/2011  . Chronic migraine without aura 10/13/2011  . Unspecified dyspareunia 10/13/2011  . IBS (irritable bowel syndrome) 08/25/2011  . Diarrhea 05/27/2011  . OBESITY, UNSPECIFIED 09/17/2010  . Transaminitis 09/17/2010  . IDA (iron deficiency anemia) 07/23/2010    History obtained from: chart review and patient.  Tenesia is a 35 y.o. female presenting for a follow up visit. She was last seen in December 2020. At that time, her lung testing looked much better on the Symbicort 160mg  two puffs BID. We continued that same dosing and continued with Xopenex 2 puffs every 4-6 hours as needed. For her rhinitis, we continued with nasal saline rinses 1-2 times daily as well as fluticasone one spray per nostril daily. We also asked her PCP to help with referral to Neurology for evaluation of her migraines.   We did labs at the last visit to evaluate her immune system. This was all normal. We also confirmed her environmental allergies since her testing was negative; this showed sensitizations to grasses, ragweed, trees, and cockroach.   Since the last visit, she tells me that she is having some SOB episodes at night. These do awaken her up from rest.    Asthma/Respiratory Symptom History: She remains on the Symbicort two puffs BID. She has not needed systemic steroid since I saw her time. During these episodes of SOB, she has not tried using the albuterol at all. She denies any  chest pain or fever. When we start talking about the dogs as a cause of allergen exposure at night (with pollen exposure from the dogs causing Sherita's worsening symptoms), she starts to cry.   Allergic Rhinitis Symptom History: She remains on the cetirizine and the fluticasone. She has never needed any montelukast, but she is open to new ideas. She has never been on montelukast. She is not interested in allergy  shots at this time.   Otherwise, there have been no changes to her past medical history, surgical history, family history, or social history.    Review of Systems  Constitutional: Negative.  Negative for fever, malaise/fatigue and weight loss.  HENT: Negative.  Negative for congestion, ear discharge and ear pain.   Eyes: Negative for pain, discharge and redness.  Respiratory: Positive for shortness of breath. Negative for cough, sputum production and wheezing.   Cardiovascular: Negative.  Negative for chest pain and palpitations.  Gastrointestinal: Positive for nausea. Negative for abdominal pain, heartburn and vomiting.  Skin: Negative.  Negative for itching and rash.  Neurological: Negative for dizziness and headaches.  Endo/Heme/Allergies: Negative for environmental allergies. Does not bruise/bleed easily.       Objective:   Blood pressure 108/64, pulse 98, temperature 98.7 F (37.1 C), temperature source Temporal, resp. rate 19, SpO2 98 %. There is no height or weight on file to calculate BMI.   Physical Exam:  Physical Exam  Constitutional: She appears well-developed.  Tearful.  HENT:  Head: Normocephalic and atraumatic.  Right Ear: Tympanic membrane, external ear and ear canal normal.  Left Ear: Tympanic membrane, external ear and ear canal normal.  Nose: Mucosal edema present. No nasal deformity or septal deviation. No epistaxis. Right sinus exhibits no maxillary sinus tenderness and no frontal sinus tenderness. Left sinus exhibits no maxillary sinus tenderness and no frontal sinus tenderness.  Mouth/Throat: Uvula is midline and oropharynx is clear and moist. Mucous membranes are not pale and not dry.  Eyes: Pupils are equal, round, and reactive to light. Conjunctivae and EOM are normal. Right eye exhibits no chemosis and no discharge. Left eye exhibits no chemosis and no discharge. Right conjunctiva is not injected. Left conjunctiva is not injected.  Cardiovascular:  Normal rate, regular rhythm and normal heart sounds.  Respiratory: Effort normal and breath sounds normal. No accessory muscle usage. No tachypnea. No respiratory distress. She has no wheezes. She has no rhonchi. She has no rales. She exhibits no tenderness.  Moving air well in all lung fields.   Lymphadenopathy:    She has no cervical adenopathy.  Neurological: She is alert.  Skin: No abrasion, no petechiae and no rash noted. Rash is not papular, not vesicular and not urticarial. No erythema. No pallor.  Psychiatric: She has a normal mood and affect.     Diagnostic studies:    Spirometry: results normal (FEV1: 3.56/106%, FVC: 4.46/111%, FEV1/FVC: 80%).    Spirometry consistent with normal pattern.   Allergy Studies: none      Salvatore Marvel, MD  [facility]

## 2020-02-15 NOTE — Patient Instructions (Addendum)
1. Moderate persistent asthma, uncomplicated - Lung testing looks amazing. - I agree that your shortness of breath episodes are related to pollen exposure from the dogs. - Daily controller medication(s): Symbicort 160/4.46mcg two puffs twice daily with spacer - Prior to physical activity: Xopenex 2 puffs 10-15 minutes before physical activity. - Rescue medications: Xopenex 4 puffs every 4-6 hours as needed - Asthma control goals:  * Full participation in all desired activities (may need albuterol before activity) * Albuterol use two time or less a week on average (not counting use with activity) * Cough interfering with sleep two time or less a month * Oral steroids no more than once a year * No hospitalizations  2. Chronic rhinitis (grasses, cockroach, trees, ragweed, and trees) - Continue taking: Zyrtec (cetirizine) 10mg  tablet once daily and Flonase (fluticasone) one spray per nostril daily  - We are adding on Singulair 10mg  daily to help with your allergy symptoms. - Get a HEPA filter for the bedroom to keep the pollen/dander load down.  - You can use an extra dose of the antihistamine, if needed, for breakthrough symptoms.  - Consider nasal saline rinses 1-2 times daily to remove allergens from the nasal cavities as well as help with mucous clearance (this is especially helpful to do before the nasal sprays are given)  3. Return in about 3 months (around 05/17/2020). This can be an in-person, a virtual Webex or a telephone follow up visit.   Please inform us of any Emergency Department visits, hospitalizations, or changes in symptoms. Call us before going to the ED for breathing or allergy symptoms since we might be able to fit you in for a sick visit. Feel free to contact us anytime with any questions, problems, or concerns.  It was a pleasure to meet you today!  Websites that have reliable patient information: 1. American Academy of Asthma, Allergy, and Immunology: www.aaaai.org 2.  Food Allergy Research and Education (FARE): foodallergy.org 3. Mothers of Asthmatics: http://www.asthmacommunitynetwork.org 4. American College of Allergy, Asthma, and Immunology: www.acaai.org  "Like" [name] on Facebook and Instagram for our latest updates!      Make sure you are registered to vote! If you have moved or changed any of your contact information, you will need to get this updated before voting!  In some cases, you MAY be able to register to vote online: [email or web address]

## 2020-02-16 ENCOUNTER — Encounter: Payer: Self-pay | Admitting: Allergy & Immunology

## 2020-02-21 ENCOUNTER — Encounter: Payer: Self-pay | Admitting: Neurology

## 2020-02-21 ENCOUNTER — Telehealth (INDEPENDENT_AMBULATORY_CARE_PROVIDER_SITE_OTHER): Payer: Medicaid Other | Admitting: Neurology

## 2020-02-21 ENCOUNTER — Other Ambulatory Visit: Payer: Self-pay

## 2020-02-21 DIAGNOSIS — IMO0002 Reserved for concepts with insufficient information to code with codable children: Secondary | ICD-10-CM

## 2020-02-21 DIAGNOSIS — G43709 Chronic migraine without aura, not intractable, without status migrainosus: Secondary | ICD-10-CM | POA: Diagnosis not present

## 2020-02-21 DIAGNOSIS — G608 Other hereditary and idiopathic neuropathies: Secondary | ICD-10-CM | POA: Diagnosis not present

## 2020-02-21 MED ORDER — AIMOVIG 70 MG/ML ~~LOC~~ SOAJ: 70.0000 mg | SUBCUTANEOUS | 11 refills | Status: DC

## 2020-02-21 NOTE — Progress Notes (Signed)
New Patient Virtual Visit via Video Note The purpose of this virtual visit is to provide medical care while limiting exposure to the novel coronavirus.    Consent was obtained for video visit:  Yes.   Answered questions that patient had about telehealth interaction:  Yes.   I discussed the limitations, risks, security and privacy concerns of performing an evaluation and management service by telemedicine. I also discussed with the patient that there may be a patient responsible charge related to this service. The patient expressed understanding and agreed to proceed.  Pt location: Home Physician Location: office Name of referring provider:  Kathyrn Drown, MD I connected with Monteen Toops Camilli at patients initiation/request on 02/21/2020 at  8:10 AM EDT by video enabled telemedicine application and verified that I am speaking with the correct person using two identifiers. Pt MRN:  [id number] Pt DOB:  1986-08-23 Video Participants:  Clifton James Haydel    History of Present Illness: Jannell Franta is a 34 y.o. 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  She was previously seen last in 2017 for biopsy-proven hereditary neuropathy.  She is referred today with worsening migraines.  She has history of migraines since the age of 8.  She takes topiramate '100mg'$  twice daily for many years.  She gets daily headaches daily over the the past 3-4 months.  Pain is located over the right side of the eye with associated nausea, vomiting, photosensitivity.  She was on aimovig which significantly improved headaches to once per month.  This was stopped in September because she went to a detox center to come off hydrocodone for low back pain.    Headache triggers:  Change in weather, stress She takes Cymbalta '60mg'$  for depression and  pain. She is followed by Pain Management at [facility].  She is not working and has been on [employer] since June 2019.  She lives at home with her mother.  She has one child.  Out-side paper records, electronic medical record, and images have been reviewed where available and summarized as:  Lab Results  Component Value Date   TSH 2.616 08/25/2019   Lab Results  Component Value Date   ESRSEDRATE 4 04/27/2017    Past Medical History:  Diagnosis Date   ADD (attention deficit disorder)    Anemia 2011   2o to GASTRIC BYPASS   Anginal pain ([place])    Anxiety    Blood transfusion without reported diagnosis    Chronic daily headache    Depression    Depression    Dysmenorrhea 09/18/2013   Dysrhythmia    Elevated liver enzymes JUL 2011ALK PHOS 111-127 AST  143-267 ALT  213-321T BILI 0.6  ALB  3.7-4.06 Jun 2011 [facility] PHOS 118 AST 24 ALT 42 T BILI 0.4 ALB 3.9   Encounter for drug rehabilitation    at behavioral health for opioid addiction about 4 years ago   Family history of adverse reaction to anesthesia    'dad had to be kept on pump for breathing for morphine'   Fatty liver    Fibroid 01/18/2017   Fibromyalgia    Gastric bypass status for obesity    Gastritis JULY 2011   Heart murmur    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   History of Holter monitoring    Hx of opioid abuse ([place])  for about 4 years, about 4 years ago   Interstitial cystitis    Iron deficiency anemia 07/23/2010   Irritable bowel syndrome 2012 DIARRHEA   JUN 2012 TTG IgA 14.9   IUD FEB 2010   Lupus ([place])    Menorrhagia 07/18/2013   Migraines    Obesity (BMI 30-39.9) 2011 228 LBS BMI 36.8   Ovarian cyst    Patient desires pregnancy 09/18/2013   Polyneuropathy    PONV (postoperative nausea and vomiting) seizure post-operatively   seizure following ankle surgery   Potassium (K) deficiency    Pregnant 12/25/2013   Psychiatric pseudoseizure     RLQ abdominal pain 07/18/2013   Sciatica of left side 11/14/2014   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   Stress 09/18/2013    Past Surgical History:  Procedure Laterality Date   BIOPSY  04/10/2015   Procedure: BIOPSY;  Surgeon: Daneil Dolin, MD;  Location: [facility];  Service: Endoscopy;;   cath self every nite     for sodium bicarb injection (discontinued 2013)   CHOLECYSTECTOMY  2005   biliary dyskinesia   COLONOSCOPY  JUN 2012 ABD PN/DIARRHEA WITH PROPOFOL   NL COLON   DILATION AND CURETTAGE OF UTERUS     DILITATION & CURRETTAGE/HYSTROSCOPY WITH NOVASURE ABLATION N/A 03/24/2017   Procedure: DILATATION & CURETTAGE/HYSTEROSCOPY WITH NOVASURE ENDOMETRIAL ABLATION;  Surgeon: Jonnie Kind, MD;  Location: [facility];  Service: Gynecology;  Laterality: N/A;   ESOPHAGEAL DILATION N/A 04/10/2015   Procedure: ESOPHAGEAL DILATION WITH 54FR MALONEY DILATOR;  Surgeon: Daneil Dolin, MD;  Location: [facility];  Service: Endoscopy;  Laterality: N/A;   ESOPHAGOGASTRODUODENOSCOPY     ESOPHAGOGASTRODUODENOSCOPY (EGD) WITH PROPOFOL N/A 04/10/2015   Procedure: ESOPHAGOGASTRODUODENOSCOPY (EGD) WITH PROPOFOL;  Surgeon: Daneil Dolin, MD;  Location: [facility];  Service: Endoscopy;  Laterality: N/A;   ESOPHAGOGASTRODUODENOSCOPY (EGD) WITH PROPOFOL N/A 12/06/2016   Procedure: ESOPHAGOGASTRODUODENOSCOPY (EGD) WITH PROPOFOL;  Surgeon: Danie Binder, MD;  [facility];  Service: Endoscopy;  Laterality: N/A;   ESOPHAGOGASTRODUODENOSCOPY (EGD) WITH PROPOFOL N/A 04/27/2019   Procedure: ESOPHAGOGASTRODUODENOSCOPY (EGD) WITH PROPOFOL;  Surgeon: Rogene Houston, MD;  [facility];  Service: Endoscopy;  Laterality: N/A;  730   ESOPHAGOGASTRODUODENOSCOPY (EGD) WITH PROPOFOL N/A 08/31/2019   Procedure: ESOPHAGOGASTRODUODENOSCOPY (EGD) WITH PROPOFOL;  Surgeon: Rogene Houston, MD;  [facility];  Service: Endoscopy;  Laterality: N/A;   GAB  2007   in [place]-POUCH 5 CM    GASTRIC BYPASS  06/2006   HYSTEROSCOPY WITH D & C N/A 09/12/2014   Procedure: DILATATION AND CURETTAGE /HYSTEROSCOPY;  Surgeon: Jonnie Kind, MD;  Location: [facility];  Service: Gynecology;  Laterality: N/A;   KYPHOPLASTY N/A 08/02/2018   Procedure: KYPHOPLASTY T12;  Surgeon: Melina Schools, MD;  Location: [facility];  Service: Orthopedics;  Laterality: N/A;  60 mins   LAPAROSCOPIC TUBAL LIGATION Bilateral 03/24/2017   Procedure: LAPAROSCOPIC TUBAL LIGATION ([place] Rings);  Surgeon: Jonnie Kind, MD;  Location: [facility];  Service: Gynecology;  Laterality: Bilateral;   REPAIR VAGINAL CUFF N/A 07/30/2014   Procedure: REPAIR VAGINAL CUFF;  Surgeon: Mora Bellman, MD;  Location: [facility];  Service: Gynecology;  Laterality: N/A;   SAVORY DILATION  06/20/2012   Dr. Barnie Alderman gastritis/Ulcer in the mid jejunum. Empiric dilation.    SURAL NERVE BX Left 02/25/2016   Procedure: LEFT SURAL NERVE BIOPSY;  Surgeon: Jovita Gamma, MD;  Location: [facility];  Service: Neurosurgery;  Laterality: Left;  Left sural nerve biopsy   TONSILLECTOMY     TONSILLECTOMY AND ADENOIDECTOMY     UPPER GASTROINTESTINAL ENDOSCOPY  JULY 2011 NAUSEA-D125,V6, PH 25   Bx; GASTRITIS, POUCH-5 CM LONG   WISDOM TOOTH EXTRACTION       Medications:  Outpatient Encounter Medications as of 02/21/2020  Medication Sig   albuterol (PROVENTIL) (2.5 MG/3ML) 0.083% nebulizer solution Take 3 mLs (2.5 mg total) by nebulization every 4 (four) hours as needed for wheezing.   amphetamine-dextroamphetamine (ADDERALL XR) 30 MG 24 hr capsule Take 1 capsule (30 mg total) by mouth daily.   budesonide-formoterol (SYMBICORT) 160-4.5 MCG/ACT inhaler Inhale 2 puffs into the lungs 2 (two) times daily.   cetirizine (ZYRTEC) 10 MG tablet Take 1 tablet (10 mg total) by mouth daily.   clonazePAM (KLONOPIN) 0.5 MG tablet Take 1 tablet (0.5 mg total) by mouth 3 (three) times daily as needed for anxiety.   DULoxetine (CYMBALTA) 60 MG capsule Take 1  capsule (60 mg total) by mouth 2 (two) times daily.   HYDROmorphone (DILAUDID) 2 MG tablet Take 1-2 tablets po q 4 hrs prn pain no greater than 9 tablets/day (Patient taking differently: 5 mg. Take 1-2 tablets po q 4 hrs prn pain no greater than 9 tablets/day)   levalbuterol (XOPENEX HFA) 45 MCG/ACT inhaler Inhale 2 puffs into the lungs every 6 (six) hours as needed for wheezing.   montelukast (SINGULAIR) 10 MG tablet Take 1 tablet (10 mg total) by mouth at bedtime.   ondansetron (ZOFRAN-ODT) 4 MG disintegrating tablet Take 1 tablet (4 mg total) by mouth every 8 (eight) hours as needed for nausea or vomiting.   pantoprazole (PROTONIX) 40 MG tablet Take 1 tablet (40 mg total) by mouth 2 (two) times daily before a meal.   phenazopyridine (PYRIDIUM) 200 MG tablet Take by mouth.   promethazine (PHENERGAN) 25 MG tablet TAKE (1) TABLET BY MOUTH TWICE A DAY AS NEEDED.   topiramate (TOPAMAX) 100 MG tablet Take 1 tablet (100 mg total) by mouth 2 (two) times daily.   traZODone (DESYREL) 150 MG tablet Take 1 tablet (150 mg total) by mouth at bedtime.   [DISCONTINUED] dicyclomine (BENTYL) 10 MG capsule Take by mouth.   [DISCONTINUED] fluticasone (FLONASE) 50 MCG/ACT nasal spray Place 1 spray into both nostrils daily as needed for allergies or rhinitis.   [DISCONTINUED] sucralfate (CARAFATE) 1 GM/10ML suspension Take 1 g by mouth 4 (four) times daily.   No facility-administered encounter medications on file as of 02/21/2020.    Allergies:  Allergies  Allergen Reactions   Gabapentin Other (See Comments)    Pt states that she was unresponsive after taking this medication, but her vitals remained stable.     Metoclopramide Hcl Anxiety and Other (See Comments)    Pt states that she felt like she was trapped in a box, and could not get out.  Pt also states that she had temporary loss of movement, weakness, and tingling.     Other Itching, Rash and Hives    Please use paper tape Other  reaction(s): Other (See Comments) Please use paper tape   Tramadol Other (See Comments)    Seizures   Ativan [Lorazepam] Other (See Comments)    combative   Latex Itching, Rash and Other (See Comments)    Burns   Lyrica [Pregabalin] Other (See Comments)    suicidal   Trazodone And Nefazodone Other (See Comments)    Makes pt "like a zombie"   Zofran [Ondansetron] Other (See Comments)  Migraines in high doses     Sulfa Antibiotics Hives   Butrans [Buprenorphine] Rash    Patch caused rash, didn't help with pain   Nucynta [Tapentadol] Nausea And Vomiting   Propofol Nausea And Vomiting   Tape Itching, Rash and Other (See Comments)    Please use paper tape   Toradol [Ketorolac Tromethamine] Anxiety    Family History: Family History  Problem Relation Age of Onset   Hemochromatosis Maternal Grandmother    Migraines Maternal Grandmother    Cancer Maternal Grandmother    Breast cancer Maternal Grandmother    Hypertension Father    Diabetes Father    Coronary artery disease Father    Migraines Paternal Grandmother    Breast cancer Paternal Grandmother    Cancer Mother        breast   Hemochromatosis Mother    Breast cancer Mother    Depression Mother    Anxiety disorder Mother    Coronary artery disease Paternal Grandfather    Anxiety disorder Brother    Bipolar disorder Brother    Healthy Daughter    Healthy Son     Social History: Social History   Tobacco Use   Smoking status: Former Smoker    Packs/day: 0.25    Years: 6.00    Pack years: 1.50    Types: E-cigarettes    Start date: 05/2018   Smokeless tobacco: Never Used  Substance Use Topics   Alcohol use: Yes    Alcohol/week: 0.0 standard drinks    Comment: 1-2 beers a couple times a month    Drug use: Not Currently    Types: Marijuana    Comment: Previously opioid addiction went through rehab   Social History   Social History Narrative   Patient is right handed.    Patient drinks one cup of coffee daily.   She lives in a one-story home with her two children (42 year old son, 67 month old daughter).   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  Nonlabored breathing.   Neurological Exam: MENTAL STATUS including orientation to time, place, person, recent and remote memory, attention span and concentration, language, and fund of knowledge is normal.  Speech is not dysarthric.  CRANIAL NERVES:  Normal conjugate, extra-ocular eye movements in all directions of gaze.  No ptosis.  Normal facial symmetry and movements.    MOTOR:  Antigravity in all extremities.  No abnormal movements.  No pronator drift.   COORDINATION/GAIT:   Intact rapid alternating movements bilaterally.  Able to rise from a chair without using arms.  Gait narrow based and stable. Stressed gait intact.    IMPRESSION: 1.  Chronic migraine  - Previous medications:  Topiramate,cymbalta (she takes for depression/pain), nortriptyline (pain)  - Continue topiramate '100mg'$  BID  - Start Aimovig '70mg'$  monthly injection  2.  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    Follow Up Instructions:  I discussed the assessment and treatment plan with the patient. The patient was provided an opportunity to ask questions and all were answered. The patient agreed with the plan and demonstrated an understanding of the instructions.   The patient was advised to call back or seek an in-person evaluation if the symptoms worsen or if the condition fails to improve as anticipated.    Alda Berthold, DO

## 2020-03-06 ENCOUNTER — Emergency Department (HOSPITAL_COMMUNITY)
Admission: EM | Admit: 2020-03-06 | Discharge: 2020-03-06 | Disposition: A | Discharge status: Home / Self Care | Payer: Medicaid Other | Attending: Emergency Medicine | Admitting: Emergency Medicine

## 2020-03-06 ENCOUNTER — Encounter (HOSPITAL_COMMUNITY): Payer: Self-pay | Admitting: Emergency Medicine

## 2020-03-06 ENCOUNTER — Telehealth: Payer: Self-pay

## 2020-03-06 ENCOUNTER — Other Ambulatory Visit: Payer: Self-pay

## 2020-03-06 DIAGNOSIS — Z87891 Personal history of nicotine dependence: Secondary | ICD-10-CM | POA: Diagnosis not present

## 2020-03-06 DIAGNOSIS — G609 Hereditary and idiopathic neuropathy, unspecified: Secondary | ICD-10-CM | POA: Insufficient documentation

## 2020-03-06 DIAGNOSIS — Z91040 Latex allergy status: Secondary | ICD-10-CM | POA: Insufficient documentation

## 2020-03-06 DIAGNOSIS — G43911 Migraine, unspecified, intractable, with status migrainosus: Secondary | ICD-10-CM

## 2020-03-06 DIAGNOSIS — R531 Weakness: Secondary | ICD-10-CM | POA: Insufficient documentation

## 2020-03-06 DIAGNOSIS — Z79899 Other long term (current) drug therapy: Secondary | ICD-10-CM | POA: Diagnosis not present

## 2020-03-06 DIAGNOSIS — R200 Anesthesia of skin: Secondary | ICD-10-CM | POA: Diagnosis present

## 2020-03-06 LAB — CBC
HCT: 35.5 % — ABNORMAL LOW (ref 36.0–46.0)
Hemoglobin: 11.9 g/dL — ABNORMAL LOW (ref 12.0–15.0)
MCH: 32 pg (ref 26.0–34.0)
MCHC: 33.5 g/dL (ref 30.0–36.0)
MCV: 95.4 fL (ref 80.0–100.0)
Platelets: 223 10*3/uL (ref 150–400)
RBC: 3.72 MIL/uL — ABNORMAL LOW (ref 3.87–5.11)
RDW: 11.6 % (ref 11.5–15.5)
WBC: 6.3 10*3/uL (ref 4.0–10.5)
nRBC: 0 % (ref 0.0–0.2)

## 2020-03-06 LAB — BASIC METABOLIC PANEL
Anion gap: 6 (ref 5–15)
BUN: 16 mg/dL (ref 6–20)
CO2: 21 mmol/L — ABNORMAL LOW (ref 22–32)
Calcium: 8.6 mg/dL — ABNORMAL LOW (ref 8.9–10.3)
Chloride: 110 mmol/L (ref 98–111)
Creatinine, Ser: 0.73 mg/dL (ref 0.44–1.00)
GFR calc Af Amer: >60 mL/min (ref >60)
GFR calc non Af Amer: >60 mL/min (ref >60)
Glucose, Bld: 94 mg/dL (ref 70–99)
Potassium: 3.8 mmol/L (ref 3.5–5.1)
Sodium: 137 mmol/L (ref 135–145)

## 2020-03-06 MED ORDER — MORPHINE SULFATE (PF) 4 MG/ML IV SOLN
4.0000 mg | Freq: Once | INTRAVENOUS | Status: AC
  Administered 2020-03-06: 4 mg via INTRAVENOUS
  Filled 2020-03-06: qty 1

## 2020-03-06 MED ORDER — DIPHENHYDRAMINE HCL 50 MG/ML IJ SOLN
25.0000 mg | Freq: Once | INTRAMUSCULAR | Status: AC
  Administered 2020-03-06: 25 mg via INTRAVENOUS
  Filled 2020-03-06: qty 1

## 2020-03-06 MED ORDER — PROCHLORPERAZINE EDISYLATE 10 MG/2ML IJ SOLN
10.0000 mg | Freq: Once | INTRAMUSCULAR | Status: AC
  Administered 2020-03-06: 15:00:00 10 mg via INTRAVENOUS
  Filled 2020-03-06: qty 2

## 2020-03-06 NOTE — Telephone Encounter (Signed)
Tingling face arms face left side & slurred speech. Per nurse instruction advised pt to go to nearest emergency department.

## 2020-03-06 NOTE — ED Notes (Signed)
Pt's HR is in the 50's. Have asked Dr. Tomi Bamberger if it is okay to give 4mg  of Morphine. He stated to still administer it.

## 2020-03-06 NOTE — ED Triage Notes (Signed)
Pt c/o of left sided numbness/weakness since Monday as well as sob.

## 2020-03-06 NOTE — Discharge Instructions (Addendum)
Continue your current medications.  Follow up with your neurologist.

## 2020-03-06 NOTE — ED Provider Notes (Signed)
[facility] EMERGENCY DEPARTMENT Provider Note   CSN: [id number] Arrival date & time: 03/06/20  1347     History Chief Complaint  Patient presents with  . Numbness  . Shortness of Breath    Penny [name] is a 34 y.o. female.  HPI   Patient presents to the ED with complaints of numbness and weakness.  Patient states his symptoms on both sides of her body.  She is having bilateral numbness in her hands and her feet.  It does feel worse on the left side however.  Patient also has a headache.  This has been ongoing for several days.  She denies any cough or fevers.  Patient called her neurologist office today.  They suggested she go to the emergency room for evaluation.  There are no providers available to see her.  Patient does have a history of chronic migraines.  She also has a history of hereditary neuropathy.  Patient is not able to work due to the severity of her medical conditions.  Past Medical History:  Diagnosis Date  . ADD (attention deficit disorder)   . Anemia 2011   2o to GASTRIC BYPASS  . Anginal pain ([place])   . Anxiety   . Blood transfusion without reported diagnosis   . Chronic daily headache   . Depression   . Depression   . Dysmenorrhea 09/18/2013  . Dysrhythmia   . Elevated liver enzymes JUL 2011ALK PHOS 111-127 AST  143-267 ALT  213-321T BILI 0.6  ALB  3.7-4.06 Jun 2011 ALK PHOS 118 AST 24 ALT 42 T BILI 0.4 ALB 3.9  . Encounter for drug rehabilitation    at behavioral health for opioid addiction about 4 years ago  . Family history of adverse reaction to anesthesia    'dad had to be kept on pump for breathing for morphine'  . Fatty liver   . Fibroid 01/18/2017  . Fibromyalgia   . Gastric bypass status for obesity   . Gastritis JULY 2011  . Heart murmur   . 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 History of Holter monitoring   . Hx of opioid abuse ([place])    for about 4 years, about 4 years ago  . Interstitial cystitis   .  Iron deficiency anemia 07/23/2010  . Irritable bowel syndrome 2012 DIARRHEA   JUN 2012 TTG IgA 14.9  . IUD FEB 2010  . Lupus ([place])   . Menorrhagia 07/18/2013  . Migraines   . Obesity (BMI 30-39.9) 2011 228 LBS BMI 36.8  . Ovarian cyst   . Patient desires pregnancy 09/18/2013  . Polyneuropathy   . PONV (postoperative nausea and vomiting) seizure post-operatively   seizure following ankle surgery  . Potassium (K) deficiency   . Pregnant 12/25/2013  . Psychiatric pseudoseizure   . RLQ abdominal pain 07/18/2013  . Sciatica of left side 11/14/2014  . 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 Stress 09/18/2013    Patient Active Problem List   Diagnosis Date Noted  . Dehydration   . Intractable abdominal pain 09/19/2019  . Intractable nausea and vomiting 09/19/2019  . Gastroesophageal reflux disease without esophagitis   . Cannabis abuse 08/30/2019  . Dyspnea   . Severe anxiety 08/21/2019  . Hematemesis with nausea 04/23/2019  . Absolute anemia 04/23/2019  . Urge incontinence 10/26/2018  . S/P kyphoplasty 08/02/2018  . Herpes genitalis in women 12/19/2017  . Cannabis use disorder, moderate, dependence ([place]) 07/07/2017  . Abnormal urine  odor 03/30/2017  . Urinary tract infection with hematuria 03/30/2017  . Screening for genitourinary condition 03/30/2017  . Pain 03/30/2017  . Burning with urination 03/30/2017  . Family history of breast cancer in mother,uncertain BR CA status 02/28/2017  . Fibroid 01/18/2017  . Chronic pain syndrome 12/05/2016  . Folate deficiency 12/04/2016  . Nausea vomiting and diarrhea 12/04/2016  . Severe anemia 12/04/2016  . Symptomatic anemia 12/03/2016  . Anastomotic ulcer 04/22/2016  . Pain medication agreement signed 04/22/2016  . Complaints of leg weakness   . Paresthesia   . Left-sided weakness   . Uncontrolled pain 01/24/2016  . Malnutrition of moderate degree 01/24/2016  . Neuropathy 01/23/2016  . Inability to walk 01/23/2016  .  Paresthesias   . Bilateral leg numbness 11/26/2015  . 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 Post partum depression 05/09/2015  . Dysphagia   . Hematemesis 04/07/2015  . Intractable vomiting with nausea 04/07/2015  . Elevated liver enzymes   . Epigastric pain   . Pseudoseizure ([place]) 02/22/2015  . Seizure-like activity ([place]) 02/22/2015  . Fibromyalgia 02/18/2015  . Vulvar fissure 02/18/2015  . Clinical depression 02/01/2015  . Current smoker 01/29/2015  . Current tobacco use 01/29/2015  . 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 Leg weakness, bilateral 01/02/2015  . Gait disorder 01/02/2015  . Other symptoms and signs involving the musculoskeletal system 01/02/2015  . Abnormal gait 01/02/2015  . Cervicalgia 12/18/2014  . Sciatica of left side 11/14/2014  . Neuralgia neuritis, sciatic nerve 11/14/2014  . 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 Encounter for sterilization 09/09/2014  . Abdominal pain, acute, right lower quadrant 08/22/2014  . Headache, migraine 08/19/2014  . Seizure ([place]) 08/18/2014  . Encephalopathy 08/13/2014  . Headache 08/13/2014  . Altered mental status 08/13/2014  . Right leg numbness 07/31/2014  . Disturbance of skin sensation 07/31/2014  . Hypertension in pregnancy, transient 07/08/2014  . Previous gastric bypass affecting pregnancy, antepartum 05/22/2014  . History of bariatric surgery 05/22/2014  . 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 Antepartum mental disorder in pregnancy 05/09/2014  . Rapid palpitations 02/18/2014  . H/O maternal third degree perineal laceration, currently pregnant 02/18/2014  . High-risk pregnancy 02/18/2014  . Supervision of pregnancy with other poor reproductive or obstetric history, unspecified trimester 02/18/2014  . Restless legs 01/16/2014  . Restless leg 01/16/2014  . Chronic interstitial  cystitis 10/23/2013  . Menorrhagia 07/18/2013  . Excessive and frequent menstruation 07/18/2013  . h/o Opiate addiction 03/11/2012    Class: Acute  . History of migraine headaches 03/10/2012    Class: Acute  . Depression with anxiety 03/10/2012    Class: Chronic  . 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 Attention deficit hyperactivity disorder, 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    Class: Chronic  . Panic disorder without agoraphobia 03/10/2012  . H/O disease 03/10/2012  . Dysthymia 03/10/2012  . 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 Other specified behavioral and emotional disorders with onset usually occurring in childhood and adolescence 03/10/2012  . Pelvic congestion syndrome 10/13/2011  . Coitalgia 10/13/2011  . Chronic migraine without aura 10/13/2011  . Unspecified dyspareunia 10/13/2011  . Other specified conditions associated with female genital organs and menstrual cycle 10/13/2011  . IBS (irritable bowel syndrome) 08/25/2011  . Diarrhea 05/27/2011  . OBESITY, UNSPECIFIED 09/17/2010  . Transaminitis 09/17/2010  .  IDA (iron deficiency anemia) 07/23/2010    Past Surgical History:  Procedure Laterality Date  . BIOPSY  04/10/2015   Procedure: BIOPSY;  Surgeon: Daneil Dolin, MD;  Location: [facility];  Service: Endoscopy;;  . cath self every nite     for sodium bicarb injection (discontinued 2013)  . CHOLECYSTECTOMY  2005   biliary dyskinesia  . COLONOSCOPY  JUN 2012 ABD PN/DIARRHEA WITH PROPOFOL   NL COLON  . DILATION AND CURETTAGE OF UTERUS    . DILITATION & CURRETTAGE/HYSTROSCOPY WITH NOVASURE ABLATION N/A 03/24/2017   Procedure: DILATATION & CURETTAGE/HYSTEROSCOPY WITH NOVASURE ENDOMETRIAL ABLATION;  Surgeon: Jonnie Kind, MD;  Location: [facility];  Service: Gynecology;  Laterality: N/A;  . ESOPHAGEAL DILATION N/A 04/10/2015   Procedure: ESOPHAGEAL DILATION WITH 54FR MALONEY DILATOR;   Surgeon: Daneil Dolin, MD;  Location: [facility];  Service: Endoscopy;  Laterality: N/A;  . ESOPHAGOGASTRODUODENOSCOPY    . ESOPHAGOGASTRODUODENOSCOPY (EGD) WITH PROPOFOL N/A 04/10/2015   Procedure: ESOPHAGOGASTRODUODENOSCOPY (EGD) WITH PROPOFOL;  Surgeon: Daneil Dolin, MD;  Location: [facility];  Service: Endoscopy;  Laterality: N/A;  . ESOPHAGOGASTRODUODENOSCOPY (EGD) WITH PROPOFOL N/A 12/06/2016   Procedure: ESOPHAGOGASTRODUODENOSCOPY (EGD) WITH PROPOFOL;  Surgeon: Danie Binder, MD;  [facility];  Service: Endoscopy;  Laterality: N/A;  . ESOPHAGOGASTRODUODENOSCOPY (EGD) WITH PROPOFOL N/A 04/27/2019   Procedure: ESOPHAGOGASTRODUODENOSCOPY (EGD) WITH PROPOFOL;  Surgeon: Rogene Houston, MD;  [facility];  Service: Endoscopy;  Laterality: N/A;  730  . ESOPHAGOGASTRODUODENOSCOPY (EGD) WITH PROPOFOL N/A 08/31/2019   Procedure: ESOPHAGOGASTRODUODENOSCOPY (EGD) WITH PROPOFOL;  Surgeon: Rogene Houston, MD;  [facility];  Service: Endoscopy;  Laterality: N/A;  . GAB  2007   in [place]-POUCH 5 CM  . GASTRIC BYPASS  06/2006  . HYSTEROSCOPY WITH D & C N/A 09/12/2014   Procedure: DILATATION AND CURETTAGE /HYSTEROSCOPY;  Surgeon: Jonnie Kind, MD;  Location: [facility];  Service: Gynecology;  Laterality: N/A;  . KYPHOPLASTY N/A 08/02/2018   Procedure: KYPHOPLASTY T12;  Surgeon: Melina Schools, MD;  Location: [place];  Service: Orthopedics;  Laterality: N/A;  60 mins  . LAPAROSCOPIC TUBAL LIGATION Bilateral 03/24/2017   Procedure: LAPAROSCOPIC TUBAL LIGATION ([place] Rings);  Surgeon: Jonnie Kind, MD;  Location: [facility];  Service: Gynecology;  Laterality: Bilateral;  . REPAIR VAGINAL CUFF N/A 07/30/2014   Procedure: REPAIR VAGINAL CUFF;  Surgeon: Mora Bellman, MD;  Location: [place] [facility];  Service: Gynecology;  Laterality: N/A;  . SAVORY DILATION  06/20/2012   Dr. Barnie Alderman gastritis/Ulcer in the mid jejunum. Empiric dilation.   . SURAL NERVE BX Left 02/25/2016   Procedure: LEFT  SURAL NERVE BIOPSY;  Surgeon: Jovita Gamma, MD;  Location: [place] [facility];  Service: Neurosurgery;  Laterality: Left;  Left sural nerve biopsy  . TONSILLECTOMY    . TONSILLECTOMY AND ADENOIDECTOMY    . UPPER GASTROINTESTINAL ENDOSCOPY  JULY 2011 NAUSEA-D125,V6, PH 25   Bx; GASTRITIS, POUCH-5 CM LONG  . WISDOM TOOTH EXTRACTION       OB History    Gravida  3   Para  2   Term  1   Preterm  1   AB  1   Living  2     SAB  1   TAB      Ectopic      Multiple      Live Births  2           Family History  Problem Relation Age of  Onset  . Hemochromatosis Maternal Grandmother   . Migraines Maternal Grandmother   . Cancer Maternal Grandmother   . Breast cancer Maternal Grandmother   . Hypertension Father   . Diabetes Father   . Coronary artery disease Father   . Migraines Paternal Grandmother   . Breast cancer Paternal Grandmother   . Cancer Mother        breast  . Hemochromatosis Mother   . Breast cancer Mother   . Depression Mother   . Anxiety disorder Mother   . Coronary artery disease Paternal Grandfather   . Anxiety disorder Brother   . Bipolar disorder Brother   . Healthy Daughter   . Healthy Son     Social History   Tobacco Use  . Smoking status: Former Smoker    Packs/day: 0.25    Years: 6.00    Pack years: 1.50    Types: E-cigarettes    Start date: 05/2018  . Smokeless tobacco: Never Used  Substance Use Topics  . Alcohol use: Yes    Alcohol/week: 0.0 standard drinks    Comment: 1-2 beers a couple times a month   . Drug use: Not Currently    Types: Marijuana    Comment: Previously opioid addiction went through rehab    Home Medications Prior to Admission medications   Medication Sig Start Date End Date Taking? Authorizing Provider  albuterol (PROVENTIL) (2.5 MG/3ML) 0.083% nebulizer solution Take 3 mLs (2.5 mg total) by nebulization every 4 (four) hours as needed for wheezing. 10/08/19   Kathyrn Drown, MD  amphetamine-dextroamphetamine  (ADDERALL XR) 30 MG 24 hr capsule Take 1 capsule (30 mg total) by mouth daily. 02/14/20   Cloria Spring, MD  budesonide-formoterol [facility]) 160-4.5 MCG/ACT inhaler Inhale 2 puffs into the lungs 2 (two) times daily. 11/14/19   Valentina Shaggy, MD  cetirizine (ZYRTEC) 10 MG tablet Take 1 tablet (10 mg total) by mouth daily. 10/25/19   Valentina Shaggy, MD  clonazePAM (KLONOPIN) 0.5 MG tablet Take 1 tablet (0.5 mg total) by mouth 3 (three) times daily as needed for anxiety. 02/14/20 02/13/21  Cloria Spring, MD  DULoxetine (CYMBALTA) 60 MG capsule Take 1 capsule (60 mg total) by mouth 2 (two) times daily. 02/14/20   Cloria Spring, MD  Erenumab-aooe (AIMOVIG) 70 MG/ML SOAJ Inject 70 mg into the skin every 28 (twenty-eight) days. 02/21/20   Patel, Arvin Collard K, DO  HYDROmorphone (DILAUDID) 2 MG tablet Take 1-2 tablets po q 4 hrs prn pain no greater than 9 tablets/day Patient taking differently: 5 mg. Take 1-2 tablets po q 4 hrs prn pain no greater than 9 tablets/day 10/15/19   Kathyrn Drown, MD  levalbuterol [facility]) 45 MCG/ACT inhaler Inhale 2 puffs into the lungs every 6 (six) hours as needed for wheezing. 10/25/19   Valentina Shaggy, MD  montelukast (SINGULAIR) 10 MG tablet Take 1 tablet (10 mg total) by mouth at bedtime. 02/15/20   Valentina Shaggy, MD  ondansetron (ZOFRAN-ODT) 4 MG disintegrating tablet Take 1 tablet (4 mg total) by mouth every 8 (eight) hours as needed for nausea or vomiting. 09/20/19   Barton Dubois, MD  pantoprazole (PROTONIX) 40 MG tablet Take 1 tablet (40 mg total) by mouth 2 (two) times daily before a meal. 11/20/19   Kathyrn Drown, MD  phenazopyridine (PYRIDIUM) 200 MG tablet Take by mouth. 02/23/10   [provider]  promethazine (PHENERGAN) 25 MG tablet TAKE (1) TABLET BY MOUTH TWICE A  DAY AS NEEDED. 02/06/20   Mikey Kirschner, MD  topiramate (TOPAMAX) 100 MG tablet Take 1 tablet (100 mg total) by mouth 2 (two) times daily. 02/14/20 02/13/21   Cloria Spring, MD  traZODone (DESYREL) 150 MG tablet Take 1 tablet (150 mg total) by mouth at bedtime. 02/14/20   Cloria Spring, MD    Allergies    Gabapentin, Metoclopramide hcl, Other, Tramadol, Ativan [lorazepam], Latex, Lyrica [pregabalin], Trazodone and nefazodone, Zofran [ondansetron], Sulfa antibiotics, Butrans [buprenorphine], Nucynta [tapentadol], Propofol, Tape, and Toradol [ketorolac tromethamine]  Review of Systems   Review of Systems  All other systems reviewed and are negative.   Physical Exam Updated Vital Signs BP 112/67   Pulse (!) 56   Temp 98.1 F (36.7 C) (Oral)   Resp 17   Ht 1.676 m (5' 6")   Wt 87.1 kg   SpO2 98%   BMI 30.99 kg/m   Physical Exam Vitals and nursing note reviewed.  Constitutional:      General: She is not in acute distress.    Appearance: She is well-developed.  HENT:     Head: Normocephalic and atraumatic.     Right Ear: External ear normal.     Left Ear: External ear normal.  Eyes:     General: No scleral icterus.       Right eye: No discharge.        Left eye: No discharge.     Conjunctiva/sclera: Conjunctivae normal.  Neck:     Trachea: No tracheal deviation.  Cardiovascular:     Rate and Rhythm: Normal rate and regular rhythm.  Pulmonary:     Effort: Pulmonary effort is normal. No respiratory distress.     Breath sounds: Normal breath sounds. No stridor. No wheezing or rales.  Abdominal:     General: Bowel sounds are normal. There is no distension.     Palpations: Abdomen is soft.     Tenderness: There is no abdominal tenderness. There is no guarding or rebound.  Musculoskeletal:        General: No tenderness.     Cervical back: Neck supple.  Skin:    General: Skin is warm and dry.     Findings: No rash.  Neurological:     Mental Status: She is alert and oriented to person, place, and time.     Cranial Nerves: No cranial nerve deficit (no facial droop, extraocular movements intact, no slurred speech).      Sensory: No sensory deficit.     Motor: No abnormal muscle tone or seizure activity.     Coordination: Coordination normal.     Comments: Sensation intact but altered from baseline, left greater than right all extremities     ED Results / Procedures / Treatments   Labs (all labs ordered are listed, but only abnormal results are displayed) Labs Reviewed  CBC - Abnormal; Notable for the following components:      Result Value   RBC 3.72 (*)    Hemoglobin 11.9 (*)    HCT 35.5 (*)    All other components within normal limits  BASIC METABOLIC PANEL    EKG EKG Interpretation  Date/Time:  Thursday March 06 2020 14:07:19 EDT Ventricular Rate:  90 PR Interval:    QRS Duration: 81 QT Interval:  373 QTC Calculation: 457 R Axis:   73 Text Interpretation: Sinus rhythm No significant change since last tracing Confirmed by Dorie Rank [phone]) on 03/06/2020 2:12:15 PM   Radiology No results  found.  Procedures Procedures (including critical care time)  Medications Ordered in ED Medications  prochlorperazine (COMPAZINE) injection 10 mg (10 mg Intravenous Given 03/06/20 1511)  diphenhydrAMINE (BENADRYL) injection 25 mg (25 mg Intravenous Given 03/06/20 1511)  morphine 4 MG/ML injection 4 mg (4 mg Intravenous Given 03/06/20 1552)    ED Course  I have reviewed the triage vital signs and the nursing notes.  Pertinent labs & imaging results that were available during my care of the patient were reviewed by me and considered in my medical decision making (see chart for details).  Clinical Course as of Mar 07 1599  Thu Mar 06, 2020  1545 Pt still having a headache.  Will order additional meds   [JK]    Clinical Course User Index [JK] Dorie Rank, MD   MDM Rules/Calculators/A&P                      Patient has multiple medical problems.  She does have a history of complex migraines as well as neuropathy.  Patient has no focal neurologic deficits than her sensory symptoms.  I am not  suspicious for stroke or hemorrhage.  I suspect her symptoms are related to her migraine headache.  Metabolic panel is pending.  CBC is normal.  Pt treated with a migraine cocktail.  BMET pending.  Dr Roderic Palau will follow up on labs.  Anticipate dc Final Clinical Impression(s) / ED Diagnoses Final diagnoses:  Intractable migraine with status migrainosus, unspecified migraine type  Hereditary peripheral neuropathy    Rx / DC Orders ED Discharge Orders    None       Dorie Rank, MD 03/06/20 1600

## 2020-03-12 DIAGNOSIS — G8929 Other chronic pain: Secondary | ICD-10-CM | POA: Diagnosis not present

## 2020-03-12 DIAGNOSIS — M5442 Lumbago with sciatica, left side: Secondary | ICD-10-CM | POA: Diagnosis not present

## 2020-03-12 DIAGNOSIS — Z79899 Other long term (current) drug therapy: Secondary | ICD-10-CM | POA: Diagnosis not present

## 2020-03-12 DIAGNOSIS — M5441 Lumbago with sciatica, right side: Secondary | ICD-10-CM | POA: Diagnosis not present

## 2020-03-21 ENCOUNTER — Ambulatory Visit: Payer: Medicaid Other | Admitting: Neurology

## 2020-04-09 DIAGNOSIS — Z79899 Other long term (current) drug therapy: Secondary | ICD-10-CM | POA: Diagnosis not present

## 2020-04-09 DIAGNOSIS — M5442 Lumbago with sciatica, left side: Secondary | ICD-10-CM | POA: Diagnosis not present

## 2020-04-09 DIAGNOSIS — G8929 Other chronic pain: Secondary | ICD-10-CM | POA: Diagnosis not present

## 2020-04-09 DIAGNOSIS — M5441 Lumbago with sciatica, right side: Secondary | ICD-10-CM | POA: Diagnosis not present

## 2020-04-12 ENCOUNTER — Other Ambulatory Visit: Payer: Self-pay | Admitting: Family Medicine

## 2020-04-14 NOTE — Telephone Encounter (Signed)
Med check up 10/15/19

## 2020-04-15 ENCOUNTER — Other Ambulatory Visit: Payer: Self-pay

## 2020-04-15 ENCOUNTER — Telehealth (INDEPENDENT_AMBULATORY_CARE_PROVIDER_SITE_OTHER): Payer: Medicaid Other | Admitting: Psychiatry

## 2020-04-15 ENCOUNTER — Encounter (HOSPITAL_COMMUNITY): Payer: Self-pay | Admitting: Psychiatry

## 2020-04-15 DIAGNOSIS — F332 Major depressive disorder, recurrent severe without psychotic features: Secondary | ICD-10-CM

## 2020-04-15 DIAGNOSIS — F900 Attention-deficit hyperactivity disorder, predominantly inattentive type: Secondary | ICD-10-CM | POA: Diagnosis not present

## 2020-04-15 DIAGNOSIS — F411 Generalized anxiety disorder: Secondary | ICD-10-CM

## 2020-04-15 MED ORDER — TOPIRAMATE 100 MG PO TABS: 100.0000 mg | ORAL_TABLET | Freq: Two times a day (BID) | ORAL | 2 refills | Status: DC

## 2020-04-15 MED ORDER — DULOXETINE HCL 60 MG PO CPEP: 60.0000 mg | ORAL_CAPSULE | Freq: Two times a day (BID) | ORAL | 2 refills | Status: DC

## 2020-04-15 MED ORDER — AMPHETAMINE-DEXTROAMPHET ER 30 MG PO CP24: 30.0000 mg | ORAL_CAPSULE | Freq: Every day | ORAL | 0 refills | Status: DC

## 2020-04-15 MED ORDER — AMPHETAMINE-DEXTROAMPHET ER 30 MG PO CP24
30.0000 mg | ORAL_CAPSULE | ORAL | 0 refills | Status: DC
Start: 2020-04-15 — End: 2020-06-18

## 2020-04-15 MED ORDER — CLONAZEPAM 0.5 MG PO TABS: 0.5000 mg | ORAL_TABLET | Freq: Two times a day (BID) | ORAL | 2 refills | Status: DC | PRN

## 2020-04-15 MED ORDER — TRAZODONE HCL 150 MG PO TABS: 150.0000 mg | ORAL_TABLET | Freq: Every day | ORAL | 2 refills | Status: DC

## 2020-04-15 NOTE — Progress Notes (Signed)
Virtual Visit via Video Note  I connected with Penny [name] on 04/15/20 at 11:40 AM EDT by a video enabled telemedicine application and verified that I am speaking with the correct person using two identifiers.   I discussed the limitations of evaluation and management by telemedicine and the availability of in person appointments. The patient expressed understanding and agreed to proceed.    I discussed the assessment and treatment plan with the patient. The patient was provided an opportunity to ask questions and all were answered. The patient agreed with the plan and demonstrated an understanding of the instructions.   The patient was advised to call back or seek an in-person evaluation if the symptoms worsen or if the condition fails to improve as anticipated.  I provided 15 minutes of non-face-to-face time during this encounter.   [name], MD  [facility] MD/PA/NP OP Progress Note  04/15/2020 11:53 AM Penny [name]  MRN:  [id number]  Chief Complaint:  Chief Complaint    Anxiety; Depression; ADD; Follow-up     HPI: This patient is a 34 year old female who is living with her boyfriend her daughter and son as well as her boyfriend's 2 daughters in [place].  She is currently not working and has applied for disability  The patient returns for follow-up today after 2 months.  Overall she states she is doing okay.  She has strained her back and cannot get out of bed right now but states before that she was quite active with her family.  She states that she and her fianc are having some conflicts over how to raise her kids in a blended family but they are trying to work these things out.  Interestingly today she has further decrease in her clonazepam dose whereas last time she asked for an increase.  She states that pain management wants her to be on the lowest dosage possible so we will decrease it to twice daily.  She states that overall her mood has been stable and she  denies thoughts of suicide or self-harm.  She denies severe anxiety or panic attacks.  She is sleeping well.  She is still having difficulty with migraine headache but is now taking Aimovig and the Topamax also helps. Visit Diagnosis:    ICD-10-CM   1. Major depressive disorder, recurrent episode, 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[place])  F33.2   2. GAD (generalized anxiety disorder)  F41.1   3. Attention deficit hyperactivity disorder (ADHD), predominantly inattentive type  F90.0     Past Psychiatric History: Psychiatric hospitalization 2 years ago for depression  Past Medical History:  Past Medical History:  Diagnosis Date  . ADD (attention deficit disorder)   . Anemia 2011   2o to GASTRIC BYPASS  . Anginal pain ([place])   . Anxiety   . Blood transfusion without reported diagnosis   . Chronic daily headache   . Depression   . Depression   . Dysmenorrhea 09/18/2013  . Dysrhythmia   . Elevated liver enzymes JUL 2011ALK PHOS 111-127 AST  143-267 ALT  213-321T BILI 0.6  ALB  3.7-4.06 Jun 2011 ALK PHOS 118 AST 24 ALT 42 T BILI 0.4 ALB 3.9  . Encounter for drug rehabilitation    at behavioral health for opioid addiction about 4 years ago  . Family history of adverse reaction to anesthesia    'dad had to be kept on pump for breathing for morphine'  . Fatty liver   . Fibroid 01/18/2017  .  Fibromyalgia   . Gastric bypass status for obesity   . Gastritis JULY 2011  . Heart murmur   . 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 History of Holter monitoring   . Hx of opioid abuse ([place])    for about 4 years, about 4 years ago  . Interstitial cystitis   . Iron deficiency anemia 07/23/2010  . Irritable bowel syndrome 2012 DIARRHEA   JUN 2012 TTG IgA 14.9  . IUD FEB 2010  . Lupus ([place])   . Menorrhagia 07/18/2013  . Migraines   . Obesity (BMI 30-39.9) 2011 228 LBS BMI 36.8  . Ovarian cyst   . Patient desires pregnancy 09/18/2013  . Polyneuropathy   . PONV (postoperative  nausea and vomiting) seizure post-operatively   seizure following ankle surgery  . Potassium (K) deficiency   . Pregnant 12/25/2013  . Psychiatric pseudoseizure   . RLQ abdominal pain 07/18/2013  . Sciatica of left side 11/14/2014  . 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 Stress 09/18/2013    Past Surgical History:  Procedure Laterality Date  . BIOPSY  04/10/2015   Procedure: BIOPSY;  Surgeon: Daneil Dolin, MD;  Location: [facility];  Service: Endoscopy;;  . cath self every nite     for sodium bicarb injection (discontinued 2013)  . CHOLECYSTECTOMY  2005   biliary dyskinesia  . COLONOSCOPY  JUN 2012 ABD PN/DIARRHEA WITH PROPOFOL   NL COLON  . DILATION AND CURETTAGE OF UTERUS    . DILITATION & CURRETTAGE/HYSTROSCOPY WITH NOVASURE ABLATION N/A 03/24/2017   Procedure: DILATATION & CURETTAGE/HYSTEROSCOPY WITH NOVASURE ENDOMETRIAL ABLATION;  Surgeon: Jonnie Kind, MD;  Location: [facility];  Service: Gynecology;  Laterality: N/A;  . ESOPHAGEAL DILATION N/A 04/10/2015   Procedure: ESOPHAGEAL DILATION WITH 54FR MALONEY DILATOR;  Surgeon: Daneil Dolin, MD;  Location: [facility];  Service: Endoscopy;  Laterality: N/A;  . ESOPHAGOGASTRODUODENOSCOPY    . ESOPHAGOGASTRODUODENOSCOPY (EGD) WITH PROPOFOL N/A 04/10/2015   Procedure: ESOPHAGOGASTRODUODENOSCOPY (EGD) WITH PROPOFOL;  Surgeon: Daneil Dolin, MD;  Location: [facility];  Service: Endoscopy;  Laterality: N/A;  . ESOPHAGOGASTRODUODENOSCOPY (EGD) WITH PROPOFOL N/A 12/06/2016   Procedure: ESOPHAGOGASTRODUODENOSCOPY (EGD) WITH PROPOFOL;  Surgeon: Danie Binder, MD;  [facility];  Service: Endoscopy;  Laterality: N/A;  . ESOPHAGOGASTRODUODENOSCOPY (EGD) WITH PROPOFOL N/A 04/27/2019   Procedure: ESOPHAGOGASTRODUODENOSCOPY (EGD) WITH PROPOFOL;  Surgeon: Rogene Houston, MD;  [facility];  Service: Endoscopy;  Laterality: N/A;  730  . ESOPHAGOGASTRODUODENOSCOPY (EGD) WITH PROPOFOL N/A 08/31/2019   Procedure: ESOPHAGOGASTRODUODENOSCOPY  (EGD) WITH PROPOFOL;  Surgeon: Rogene Houston, MD;  [facility];  Service: Endoscopy;  Laterality: N/A;  . GAB  2007   in [place]-POUCH 5 CM  . GASTRIC BYPASS  06/2006  . HYSTEROSCOPY WITH D & C N/A 09/12/2014   Procedure: DILATATION AND CURETTAGE /HYSTEROSCOPY;  Surgeon: Jonnie Kind, MD;  Location: [facility];  Service: Gynecology;  Laterality: N/A;  . KYPHOPLASTY N/A 08/02/2018   Procedure: KYPHOPLASTY T12;  Surgeon: Melina Schools, MD;  Location: [facility];  Service: Orthopedics;  Laterality: N/A;  60 mins  . LAPAROSCOPIC TUBAL LIGATION Bilateral 03/24/2017   Procedure: LAPAROSCOPIC TUBAL LIGATION ([place] Rings);  Surgeon: Jonnie Kind, MD;  Location: [facility];  Service: Gynecology;  Laterality: Bilateral;  . REPAIR VAGINAL CUFF N/A 07/30/2014   Procedure: REPAIR VAGINAL CUFF;  Surgeon: Mora Bellman, MD;  Location: [facility];  Service: Gynecology;  Laterality: N/A;  . SAVORY DILATION  06/20/2012   Dr. Barnie Alderman gastritis/Ulcer in the mid jejunum. Empiric dilation.   . SURAL NERVE BX Left 02/25/2016   Procedure: LEFT SURAL NERVE BIOPSY;  Surgeon: Jovita Gamma, MD;  Location: [facility];  Service: Neurosurgery;  Laterality: Left;  Left sural nerve biopsy  . TONSILLECTOMY    . TONSILLECTOMY AND ADENOIDECTOMY    . UPPER GASTROINTESTINAL ENDOSCOPY  JULY 2011 NAUSEA-D125,V6, PH 25   Bx; GASTRITIS, POUCH-5 CM LONG  . WISDOM TOOTH EXTRACTION      Family Psychiatric History: see below  Family History:  Family History  Problem Relation Age of Onset  . Hemochromatosis Maternal Grandmother   . Migraines Maternal Grandmother   . Cancer Maternal Grandmother   . Breast cancer Maternal Grandmother   . Hypertension Father   . Diabetes Father   . Coronary artery disease Father   . Migraines Paternal Grandmother   . Breast cancer Paternal Grandmother   . Cancer Mother        breast  . Hemochromatosis Mother   . Breast cancer Mother   . Depression Mother   . Anxiety disorder  Mother   . Coronary artery disease Paternal Grandfather   . Anxiety disorder Brother   . Bipolar disorder Brother   . Healthy Daughter   . Healthy Son     Social History:  Social History   Socioeconomic History  . Marital status: Divorced    Spouse name: Not on file  . Number of children: 2  . Years of education: some colle  . Highest education level: Not on file  Occupational History    Employer: NOT EMPLOYED  Tobacco Use  . Smoking status: Former Smoker    Packs/day: 0.25    Years: 6.00    Pack years: 1.50    Types: E-cigarettes    Start date: 05/2018  . Smokeless tobacco: Never Used  Substance and Sexual Activity  . Alcohol use: Yes    Alcohol/week: 0.0 standard drinks    Comment: 1-2 beers a couple times a month   . Drug use: Not Currently    Types: Marijuana    Comment: Previously opioid addiction went through rehab  . Sexual activity: Yes    Partners: Male    Birth control/protection: Surgical, Condom    Comment: tubal and ablation  Other Topics Concern  . Not on file  Social History Narrative   Patient is right handed.   Patient drinks one cup of coffee daily.   She lives in a one-story home with her two children (1 year old son, 64 month old daughter).   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 Difficulty of Paying Living Expenses:   Food Insecurity:   . Worried About Charity fundraiser in the Last Year:   . Arboriculturist in the Last Year:   Transportation Needs:   . Film/video editor (Medical):   Marland Kitchen Lack of Transportation (Non-Medical):   Physical Activity:   . Days of Exercise per Week:   . Minutes of Exercise per Session:   Stress:   . Feeling of Stress :   Social Connections:   . Frequency of Communication with Friends and Family:   . Frequency of Social Gatherings with Friends and Family:   . Attends Religious Services:    . Active Member of Clubs or Organizations:   .  Attends Archivist Meetings:   Marland Kitchen Marital Status:     Allergies:  Allergies  Allergen Reactions  . Gabapentin Other (See Comments)    Pt states that she was unresponsive after taking this medication, but her vitals remained stable.    . Metoclopramide Hcl Anxiety and Other (See Comments)    Pt states that she felt like she was trapped in a box, and could not get out.  Pt also states that she had temporary loss of movement, weakness, and tingling.    . Other Itching, Rash and Hives    Please use paper tape Other reaction(s): Other (See Comments) Please use paper tape  . Tramadol Other (See Comments)    Seizures  . Ativan [Lorazepam] Other (See Comments)    combative  . Latex Itching, Rash and Other (See Comments)    Burns  . Lyrica [Pregabalin] Other (See Comments)    suicidal  . Trazodone And Nefazodone Other (See Comments)    Makes pt "like a zombie"  . Zofran [Ondansetron] Other (See Comments)    Migraines in high doses    . Sulfa Antibiotics Hives  . Butrans [Buprenorphine] Rash    Patch caused rash, didn't help with pain  . Nucynta [Tapentadol] Nausea And Vomiting  . Propofol Nausea And Vomiting  . Tape Itching, Rash and Other (See Comments)    Please use paper tape  . Toradol [Ketorolac Tromethamine] Anxiety    Metabolic Disorder Labs: Lab Results  Component Value Date   HGBA1C 5.7 (H) 01/15/2015   Lab Results  Component Value Date   PROLACTIN 80.0 (H) 05/09/2015   Lab Results  Component Value Date   CHOL 95 03/10/2012   TRIG 50 03/10/2012   HDL 38 (L) 03/10/2012   CHOLHDL 2.5 03/10/2012   VLDL 10 03/10/2012   LDLCALC 47 03/10/2012   Lab Results  Component Value Date   TSH 2.616 08/25/2019   TSH 1.592 05/25/2019    Therapeutic Level Labs: No results found for: LITHIUM No results found for: VALPROATE No components found for:  CBMZ  Current Medications: Current Outpatient Medications   Medication Sig Dispense Refill  . albuterol (PROVENTIL) (2.5 MG/3ML) 0.083% nebulizer solution Take 3 mLs (2.5 mg total) by nebulization every 4 (four) hours as needed for wheezing. 150 mL 12  . amphetamine-dextroamphetamine (ADDERALL XR) 30 MG 24 hr capsule Take 1 capsule (30 mg total) by mouth daily. 30 capsule 0  . amphetamine-dextroamphetamine (ADDERALL XR) 30 MG 24 hr capsule Take 1 capsule (30 mg total) by mouth every morning. 30 capsule 0  . budesonide-formoterol (SYMBICORT) 160-4.5 MCG/ACT inhaler Inhale 2 puffs into the lungs 2 (two) times daily. 1 Inhaler 5  . cetirizine (ZYRTEC) 10 MG tablet Take 1 tablet (10 mg total) by mouth daily. 30 tablet 5  . clonazePAM (KLONOPIN) 0.5 MG tablet Take 1 tablet (0.5 mg total) by mouth 2 (two) times daily as needed for anxiety. 60 tablet 2  . DULoxetine (CYMBALTA) 60 MG capsule Take 1 capsule (60 mg total) by mouth 2 (two) times daily. 180 capsule 2  . Erenumab-aooe (AIMOVIG) 70 MG/ML SOAJ Inject 70 mg into the skin every 28 (twenty-eight) days. 1 pen 11  . HYDROmorphone (DILAUDID) 2 MG tablet Take 1-2 tablets po q 4 hrs prn pain no greater than 9 tablets/day (Patient taking differently: 5 mg. Take 1-2 tablets po q 4 hrs prn pain no greater than 9 tablets/day) 65 tablet 0  . levalbuterol (XOPENEX HFA) 45 MCG/ACT inhaler  Inhale 2 puffs into the lungs every 6 (six) hours as needed for wheezing. 1 Inhaler 5  . montelukast (SINGULAIR) 10 MG tablet Take 1 tablet (10 mg total) by mouth at bedtime. 30 tablet 5  . ondansetron (ZOFRAN-ODT) 4 MG disintegrating tablet Take 1 tablet (4 mg total) by mouth every 8 (eight) hours as needed for nausea or vomiting. 20 tablet 0  . pantoprazole (PROTONIX) 40 MG tablet Take 1 tablet (40 mg total) by mouth 2 (two) times daily before a meal. 60 tablet 5  . phenazopyridine (PYRIDIUM) 200 MG tablet Take by mouth.    . promethazine (PHENERGAN) 25 MG tablet TAKE (1) TABLET BY MOUTH TWICE A DAY AS NEEDED. 14 tablet 0  .  topiramate (TOPAMAX) 100 MG tablet Take 1 tablet (100 mg total) by mouth 2 (two) times daily. 60 tablet 2  . traZODone (DESYREL) 150 MG tablet Take 1 tablet (150 mg total) by mouth at bedtime. 30 tablet 2   No current facility-administered medications for this visit.     Musculoskeletal: Strength & Muscle Tone: within normal limits Gait & Station: normal Patient leans: N/A  Psychiatric Specialty Exam: Review of Systems  Musculoskeletal: Positive for back pain.  All other systems reviewed and are negative.   There were no vitals taken for this visit.There is no height or weight on file to calculate BMI.  General Appearance: Casual and Fairly Groomed  Eye Contact:  Good  Speech:  Clear and Coherent  Volume:  Normal  Mood:  Euthymic  Affect:  Appropriate and Congruent  Thought Process:  Goal Directed  Orientation:  Full (Time, Place, and Person)  Thought Content: WDL   Suicidal Thoughts:  No  Homicidal Thoughts:  No  Memory:  Immediate;   Good Recent;   Good Remote;   Good  Judgement:  Good  Insight:  Fair  Psychomotor Activity:  Decreased  Concentration:  Concentration: Good and Attention Span: Good  Recall:  Good  Fund of Knowledge: Good  Language: Good  Akathisia:  No  Handed:  Right  AIMS (if indicated): not done  Assets:  Communication Skills Desire for Improvement Resilience Social Support Talents/Skills  ADL's:  Intact  Cognition: WNL  Sleep:  Good   Screenings: AUDIT     ED to Hosp-Admission (Discharged) from 05/09/2015 in [facility]  Alcohol Use Disorder Identification Test Final Score (AUDIT)  0    GAD-7     Counselor from 12/11/2018 in [facility] Counselor from 02/07/2017 in [facility]  Total GAD-7 Score  19  17    PHQ2-9     Counselor from 12/11/2018 in [facility] Counselor from 07/22/2017 in  [facility] Counselor from 02/07/2017 in [facility] Office Visit from 01/10/2017 in [facility] Counselor from 10/06/2016 in [facility]  PHQ-2 Total Score  _0 PHQ-9 Total Score  _1 Assessment and Plan: This patient is a 34 year old female with a history of depression anxiety and ADHD.  Currently she seems to be doing fairly well.  She will continue clonazepam 0.5 mg only 2 times daily for anxiety.  She will continue trazodone 150 mg at bedtime for sleep, Cymbalta 60 mg twice daily for depression, Adderall XR 30 mg every morning for focus  and Topamax 100 mg twice daily for mood stabilization and headache.  She will return to see me in 2 months   [name], MD 04/15/2020, 11:53 AM

## 2020-05-06 ENCOUNTER — Other Ambulatory Visit: Payer: Self-pay | Admitting: Family Medicine

## 2020-05-07 DIAGNOSIS — Z79899 Other long term (current) drug therapy: Secondary | ICD-10-CM | POA: Diagnosis not present

## 2020-05-07 DIAGNOSIS — M5442 Lumbago with sciatica, left side: Secondary | ICD-10-CM | POA: Diagnosis not present

## 2020-05-07 DIAGNOSIS — M5441 Lumbago with sciatica, right side: Secondary | ICD-10-CM | POA: Diagnosis not present

## 2020-05-07 DIAGNOSIS — G8929 Other chronic pain: Secondary | ICD-10-CM | POA: Diagnosis not present

## 2020-05-13 ENCOUNTER — Other Ambulatory Visit: Payer: Self-pay | Admitting: Family Medicine

## 2020-05-16 ENCOUNTER — Ambulatory Visit: Payer: Medicaid Other | Admitting: Allergy & Immunology

## 2020-05-21 IMAGING — DX DG THORACIC SPINE 3V
3 series · 3 of 3 positions shown · non-contrast
Comparison: MR 05/19/2018 and previous

CLINICAL DATA: Pain post fall

EXAM:
THORACIC SPINE - 3 VIEWS

[t-spine ap]
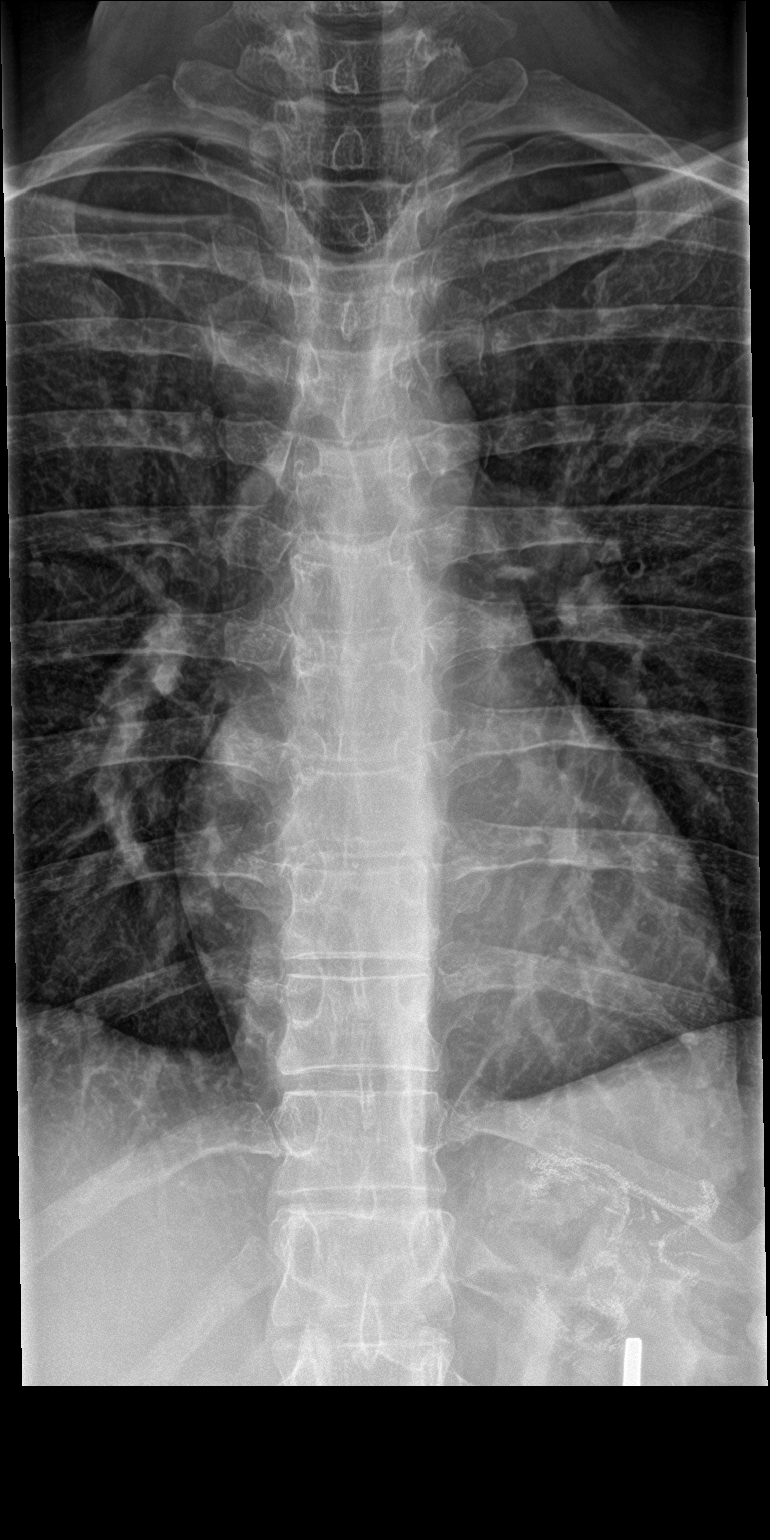

[t-spine lat]
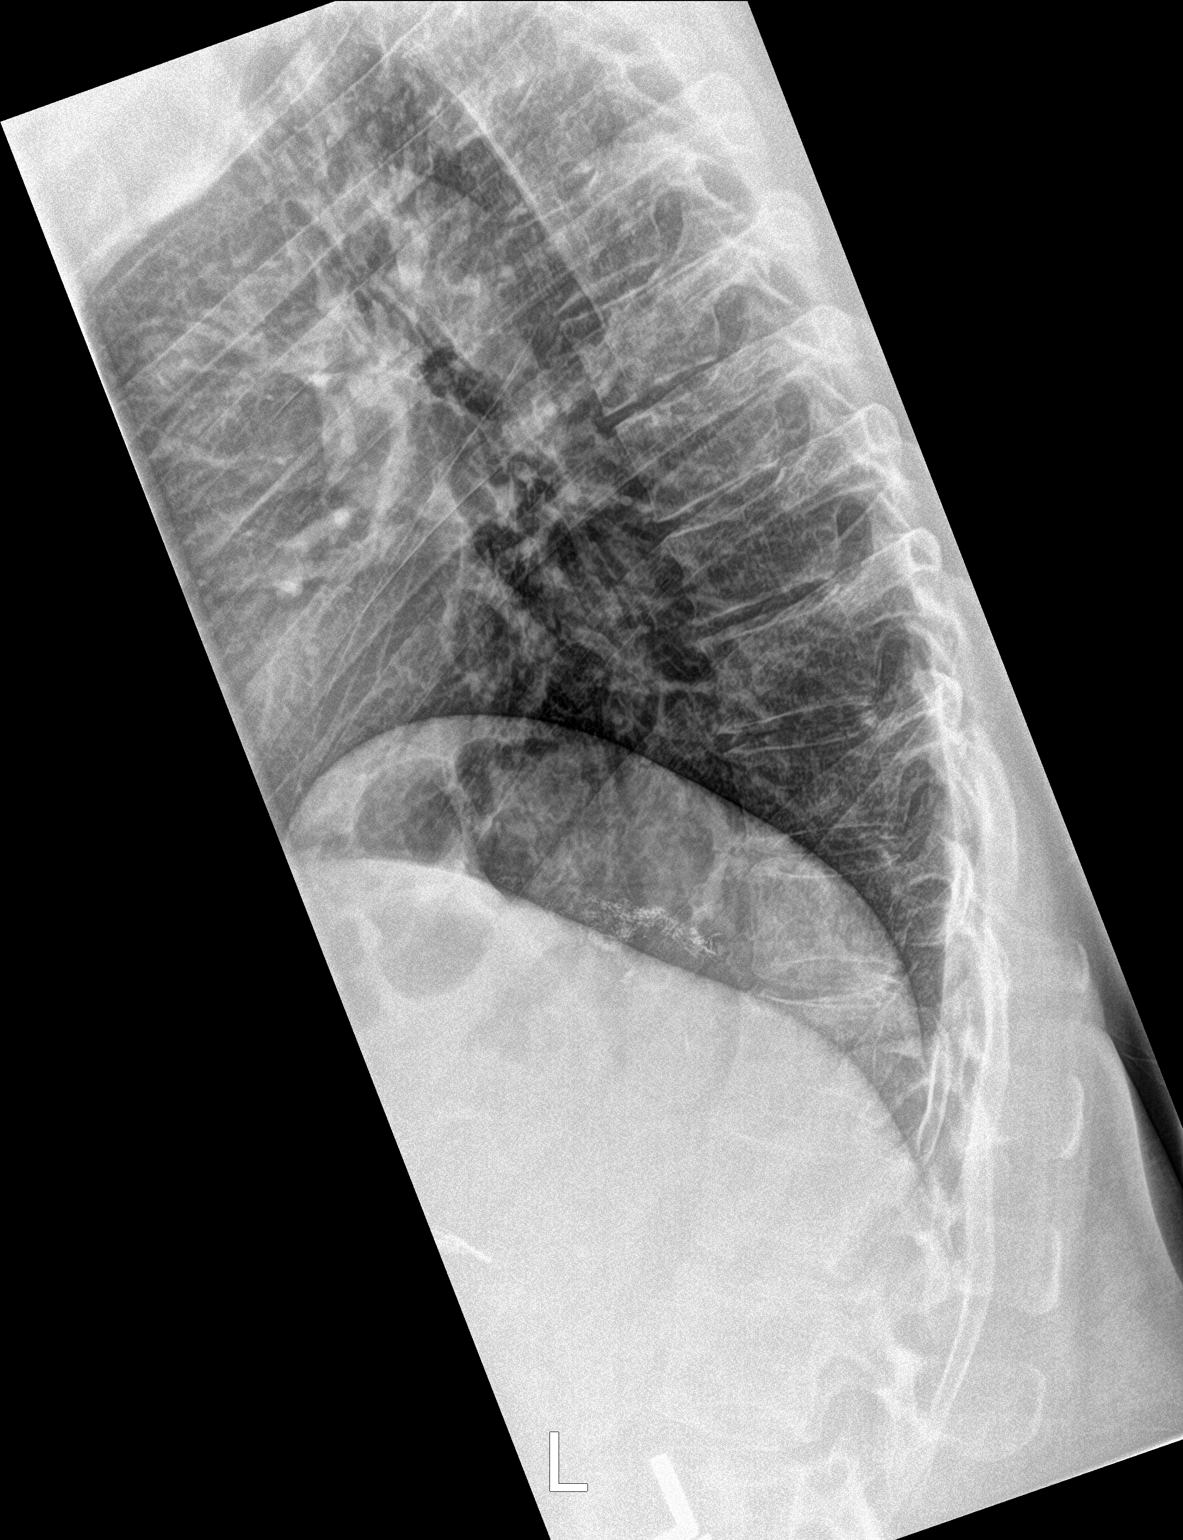

[t-spine swimmers]
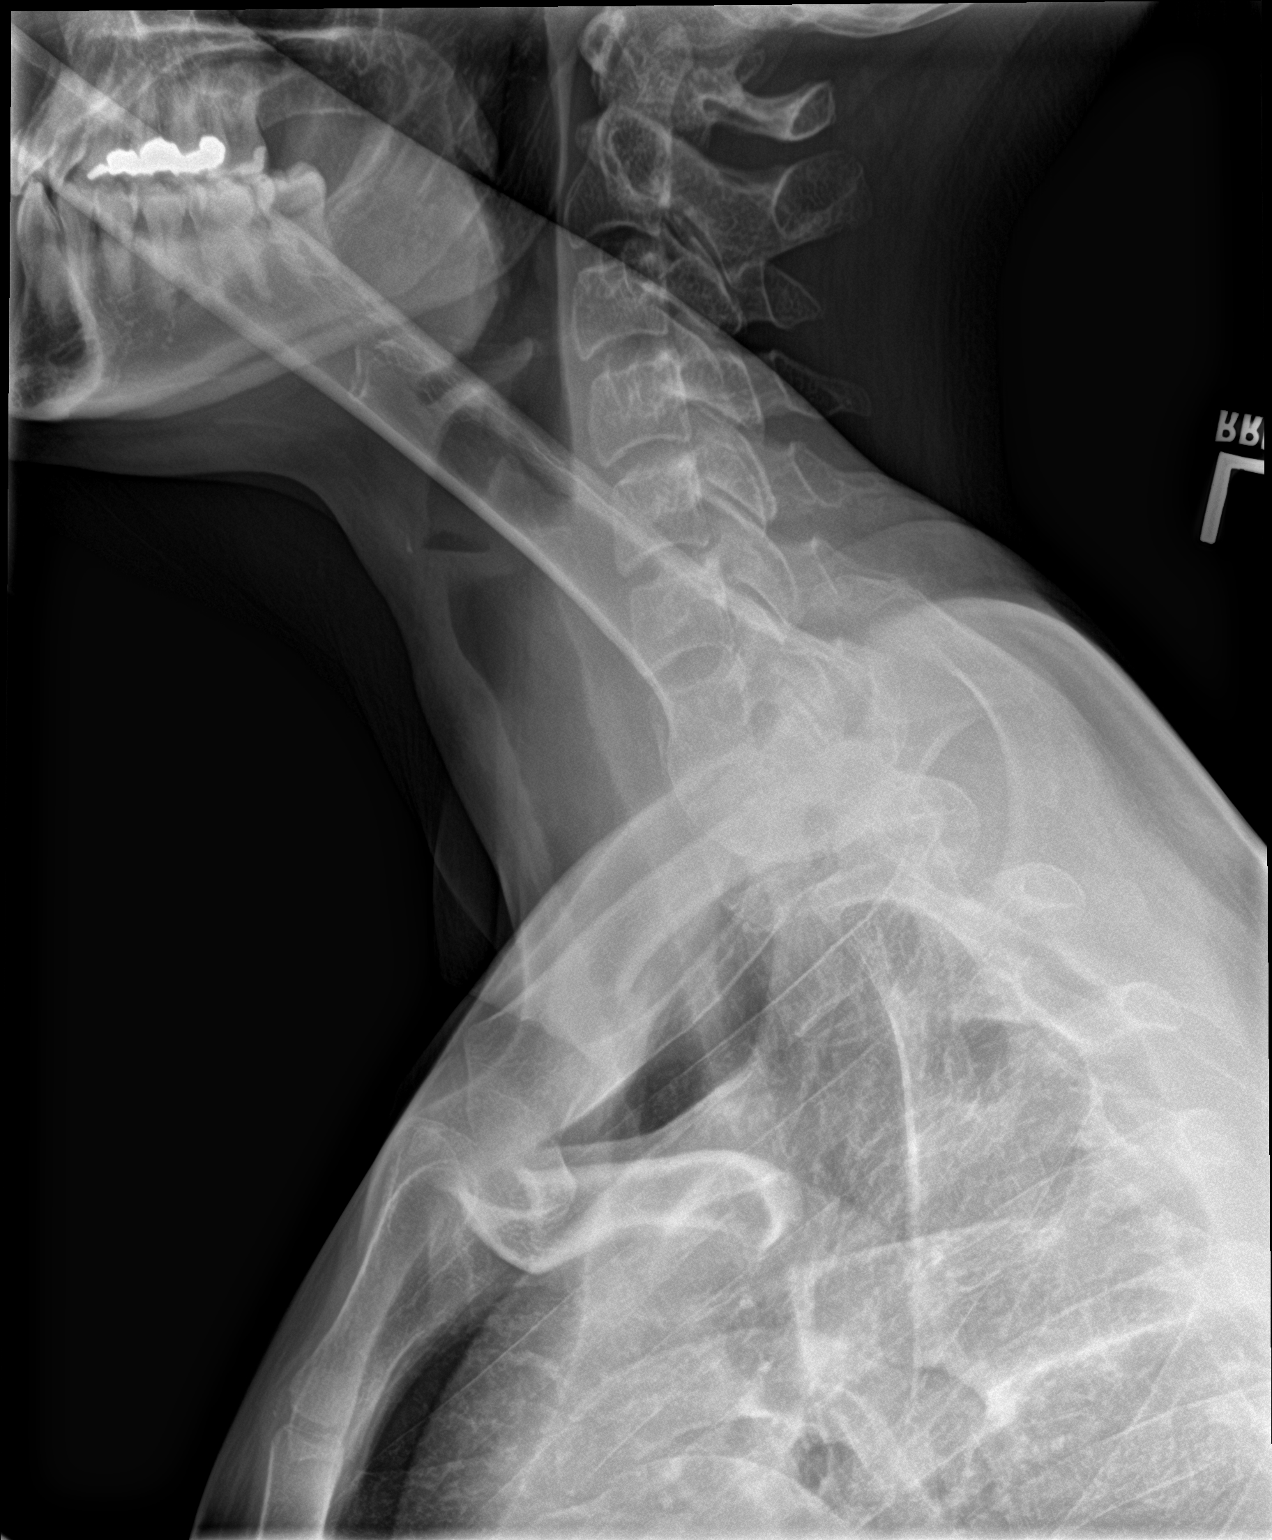

[3 of 3 positions shown; findings below may reference images not displayed]

FINDINGS: Stable mild T12 superior endplate compression deformity. No acute
fracture. No significant osseous degenerative change. Anastomotic
staple lines in the left upper abdomen. Cholecystectomy clips.
IMPRESSION: Stable mild T12 compression deformity.  No acute findings.

## 2020-05-22 DIAGNOSIS — G8929 Other chronic pain: Secondary | ICD-10-CM | POA: Diagnosis not present

## 2020-05-22 DIAGNOSIS — M5441 Lumbago with sciatica, right side: Secondary | ICD-10-CM | POA: Diagnosis not present

## 2020-05-22 DIAGNOSIS — M5442 Lumbago with sciatica, left side: Secondary | ICD-10-CM | POA: Diagnosis not present

## 2020-05-22 DIAGNOSIS — Z79899 Other long term (current) drug therapy: Secondary | ICD-10-CM | POA: Diagnosis not present

## 2020-05-23 ENCOUNTER — Other Ambulatory Visit: Payer: Self-pay

## 2020-05-23 ENCOUNTER — Encounter: Payer: Self-pay | Admitting: Allergy & Immunology

## 2020-05-23 ENCOUNTER — Ambulatory Visit (INDEPENDENT_AMBULATORY_CARE_PROVIDER_SITE_OTHER): Payer: Medicaid Other | Admitting: Allergy & Immunology

## 2020-05-23 DIAGNOSIS — J302 Other seasonal allergic rhinitis: Secondary | ICD-10-CM | POA: Diagnosis not present

## 2020-05-23 DIAGNOSIS — J3089 Other allergic rhinitis: Secondary | ICD-10-CM | POA: Diagnosis not present

## 2020-05-23 DIAGNOSIS — J4540 Moderate persistent asthma, uncomplicated: Secondary | ICD-10-CM | POA: Diagnosis not present

## 2020-05-23 MED ORDER — FLUTICASONE PROPIONATE 50 MCG/ACT NA SUSP
2.0000 | Freq: Every day | NASAL | 5 refills | Status: DC
Start: 2020-05-23 — End: 2020-06-18

## 2020-05-23 MED ORDER — BUDESONIDE-FORMOTEROL FUMARATE 160-4.5 MCG/ACT IN AERO: 2.0000 | INHALATION_SPRAY | Freq: Two times a day (BID) | RESPIRATORY_TRACT | 5 refills | Status: DC

## 2020-05-23 MED ORDER — OLOPATADINE HCL 0.1 % OP SOLN: 1.0000 [drp] | Freq: Two times a day (BID) | OPHTHALMIC | 5 refills | Status: DC | PRN

## 2020-05-23 MED ORDER — CETIRIZINE HCL 10 MG PO TABS: 10.0000 mg | ORAL_TABLET | Freq: Every day | ORAL | 5 refills | Status: DC

## 2020-05-23 NOTE — Patient Instructions (Addendum)
1. Moderate persistent asthma, uncomplicated - We will do lung testing at the next visit. - We are not going to make any medication changes at all today.  - We could decrease her to the lower dose Symbicort at the next visit if she continues to remain stable.  - Daily controller medication(s): Symbicort 160/4.66mcg two puffs twice daily with spacer - Prior to physical activity: Xopenex 2 puffs 10-15 minutes before physical activity. - Rescue medications: Xopenex 4 puffs every 4-6 hours as needed - Asthma control goals:  * Full participation in all desired activities (may need albuterol before activity) * Albuterol use two time or less a week on average (not counting use with activity) * Cough interfering with sleep two time or less a month * Oral steroids no more than once a year * No hospitalizations  2. Chronic rhinitis (grasses, cockroach, trees, ragweed, and trees) - Continue taking: Zyrtec (cetirizine) 10mg  tablet once daily, Singulair (montelukast) 10mg  daily and Flonase (fluticasone) one spray per nostril daily  - You can use an extra dose of the antihistamine, if needed, for breakthrough symptoms.  - Consider nasal saline rinses 1-2 times daily to remove allergens from the nasal cavities as well as help with mucous clearance (this is especially helpful to do before the nasal sprays are given)  3. Return in about 4 months (around 09/22/2020). This can be an in-person, a virtual Webex or a telephone follow up visit.   Please inform us of any Emergency Department visits, hospitalizations, or changes in symptoms. Call us before going to the ED for breathing or allergy symptoms since we might be able to fit you in for a sick visit. Feel free to contact us anytime with any questions, problems, or concerns.  It was a pleasure to talk to you today today!  Websites that have reliable patient information: 1. American Academy of Asthma, Allergy, and Immunology: www.aaaai.org 2. Food Allergy  Research and Education (FARE): foodallergy.org 3. Mothers of Asthmatics: http://www.asthmacommunitynetwork.org 4. American College of Allergy, Asthma, and Immunology: www.acaai.org   COVID-19 Vaccine Information can be found at: ShippingScam.co.uk For questions related to vaccine distribution or appointments, please email [email or web address] or call [phone].     "Like" [name] on Facebook and Instagram for our latest updates!        Make sure you are registered to vote! If you have moved or changed any of your contact information, you will need to get this updated before voting!  In some cases, you MAY be able to register to vote online: [email or web address]

## 2020-05-23 NOTE — Progress Notes (Signed)
RE: Courtni Balash [facility] MRN: [id number] DOB: Feb 16, 1986 Date of Telemedicine Visit: 05/23/2020  Referring provider: Kathyrn Drown, MD Primary care provider: Kathyrn Drown, MD  Chief Complaint: Televisit   Telemedicine Follow Up Visit via Telephone: I connected with Fonnie Crookshanks for a follow up on 05/23/20 by telephone and verified that I am speaking with the correct person using two identifiers.   I discussed the limitations, risks, security and privacy concerns of performing an evaluation and management service by telephone and the availability of in person appointments. I also discussed with the patient that there may be a patient responsible charge related to this service. The patient expressed understanding and agreed to proceed.  Patient is at home.  Provider is at the office.  Visit start time: 10:22 AM Visit end time: 10:37 AM Insurance consent/check in by: Anderson Malta Medical consent and medical assistant/nurse: Daisy Lazar  History of Present Illness:  She is a 34 y.o. female, who is being followed for persistent asthma as well as P/SAR. She also has a number of comorbid conditions including depression/anxiety as well as fibromyalgia. Her previous allergy office visit was in March 2021 with myself. At that time, she was doing very well with the high dose Symbicort two puffs BID. Her rhinitis was controlled with cetirizine as well as fluticasone one spray per nostril daily.   Since the last visit, she has mostly done well. Her migraines continue to be a problem and she was actually in the ED in April 2021. She was treated with compazine as well Benadryl and morphine.   Asthma/Respiratory Symptom History: She tells me that her breathing has been good. She has not been using the emergency inhaler as much as before, likely around once per week. She has not needed any steroid for her breathing a while. Akirra's asthma has been well controlled. She has not required rescue medication,  experienced nocturnal awakenings due to lower respiratory symptoms, nor have activities of daily living been limited. She has required no Emergency Department or Urgent Care visits for her asthma. She has required zero courses of systemic steroids for asthma exacerbations since the last visit. ACT score today is 20, indicating excellent asthma symptom control.   Allergic Rhinitis Symptom History: She remains on her fluticasone nasal spray as well as her cetirizine daily. She feels that this is working well to control her symptoms. She has not needed any antibiotics at all since the last visit.. Dry eyes   Things are good at home with four kids through a blended family. Everyone seems to be getting along fairly well. Her husband was out of work for three months, but he is back at work post surgery.   Otherwise, there have been no changes to her past medical history, surgical history, family history, or social history.  Assessment and Plan:  Leeanna is a 34 y.o. female with:  Moderate persistent asthma, uncomplicated  Perennial and seasonal allergic rhinitis (grasses, cockroach, trees, ragweed, and trees)  Migraines  Complicated past medical history, including fibromyalgia, anxiety and depression, chronic abdominal pain, seizures and pseudoseizures, drug abuse, and gastric bypass   1. Moderate persistent asthma, uncomplicated - We will do lung testing at the next visit. - We are not going to make any medication changes at all today.  - We could decrease her to the lower dose Symbicort at the next visit if she continues to remain stable.  - Daily controller medication(s): Symbicort 160/4.87mcg two puffs twice daily with spacer - Prior to  physical activity: Xopenex 2 puffs 10-15 minutes before physical activity. - Rescue medications: Xopenex 4 puffs every 4-6 hours as needed - Asthma control goals:  * Full participation in all desired activities (may need albuterol before activity) *  Albuterol use two time or less a week on average (not counting use with activity) * Cough interfering with sleep two time or less a month * Oral steroids no more than once a year * No hospitalizations  2. Chronic rhinitis (grasses, cockroach, trees, ragweed, and trees) - Continue taking: Zyrtec (cetirizine) 10mg  tablet once daily, Singulair (montelukast) 10mg  daily and Flonase (fluticasone) one spray per nostril daily  - You can use an extra dose of the antihistamine, if needed, for breakthrough symptoms.  - Consider nasal saline rinses 1-2 times daily to remove allergens from the nasal cavities as well as help with mucous clearance (this is especially helpful to do before the nasal sprays are given)  3. Return in about 4 months (around 09/22/2020). This can be an in-person, a virtual Webex or a telephone follow up visit.    Diagnostics: None.  Medication List:  Current Outpatient Medications  Medication Sig Dispense Refill  . albuterol (PROVENTIL) (2.5 MG/3ML) 0.083% nebulizer solution Take 3 mLs (2.5 mg total) by nebulization every 4 (four) hours as needed for wheezing. 150 mL 12  . amphetamine-dextroamphetamine (ADDERALL XR) 30 MG 24 hr capsule Take 1 capsule (30 mg total) by mouth daily. 30 capsule 0  . amphetamine-dextroamphetamine (ADDERALL XR) 30 MG 24 hr capsule Take 1 capsule (30 mg total) by mouth every morning. 30 capsule 0  . budesonide-formoterol (SYMBICORT) 160-4.5 MCG/ACT inhaler Inhale 2 puffs into the lungs 2 (two) times daily. 1 Inhaler 5  . cetirizine (ZYRTEC) 10 MG tablet Take 1 tablet (10 mg total) by mouth daily. 30 tablet 5  . clonazePAM (KLONOPIN) 0.5 MG tablet Take 1 tablet (0.5 mg total) by mouth 2 (two) times daily as needed for anxiety. 60 tablet 2  . DULoxetine (CYMBALTA) 60 MG capsule Take 1 capsule (60 mg total) by mouth 2 (two) times daily. 180 capsule 2  . Erenumab-aooe (AIMOVIG) 70 MG/ML SOAJ Inject 70 mg into the skin every 28 (twenty-eight) days. 1 pen  11  . HYDROmorphone (DILAUDID) 2 MG tablet Take 1-2 tablets po q 4 hrs prn pain no greater than 9 tablets/day (Patient taking differently: 5 mg. Take 1-2 tablets po q 4 hrs prn pain no greater than 9 tablets/day) 65 tablet 0  . levalbuterol (XOPENEX HFA) 45 MCG/ACT inhaler Inhale 2 puffs into the lungs every 6 (six) hours as needed for wheezing. 1 Inhaler 5  . montelukast (SINGULAIR) 10 MG tablet Take 1 tablet (10 mg total) by mouth at bedtime. 30 tablet 5  . ondansetron (ZOFRAN-ODT) 4 MG disintegrating tablet Take 1 tablet (4 mg total) by mouth every 8 (eight) hours as needed for nausea or vomiting. 20 tablet 0  . ondansetron (ZOFRAN-ODT) 8 MG disintegrating tablet DISSOLVE 1 TABLET IN MOUTH EVERY 8 HOURS AS NEEDED. 18 tablet 4  . pantoprazole (PROTONIX) 40 MG tablet Take 1 tablet (40 mg total) by mouth 2 (two) times daily before a meal. 60 tablet 5  . phenazopyridine (PYRIDIUM) 200 MG tablet Take by mouth.    . promethazine (PHENERGAN) 25 MG tablet TAKE (1) TABLET BY MOUTH TWICE A DAY AS NEEDED. 14 tablet 0  . topiramate (TOPAMAX) 100 MG tablet Take 1 tablet (100 mg total) by mouth 2 (two) times daily. 60 tablet 2  .  traZODone (DESYREL) 150 MG tablet Take 1 tablet (150 mg total) by mouth at bedtime. 30 tablet 2  . fluticasone (FLONASE) 50 MCG/ACT nasal spray Place 2 sprays into both nostrils daily. 1 g 5  . olopatadine (PATANOL) 0.1 % ophthalmic solution Place 1 drop into both eyes 2 (two) times daily as needed for allergies. 5 mL 5   No current facility-administered medications for this visit.   Allergies: Allergies  Allergen Reactions  . Gabapentin Other (See Comments)    Pt states that she was unresponsive after taking this medication, but her vitals remained stable.    . Metoclopramide Hcl Anxiety and Other (See Comments)    Pt states that she felt like she was trapped in a box, and could not get out.  Pt also states that she had temporary loss of movement, weakness, and tingling.    .  Other Itching, Rash and Hives    Please use paper tape Other reaction(s): Other (See Comments) Please use paper tape  . Tramadol Other (See Comments)    Seizures  . Ativan [Lorazepam] Other (See Comments)    combative  . Latex Itching, Rash and Other (See Comments)    Burns  . Lyrica [Pregabalin] Other (See Comments)    suicidal  . Trazodone And Nefazodone Other (See Comments)    Makes pt "like a zombie"  . Zofran [Ondansetron] Other (See Comments)    Migraines in high doses    . Sulfa Antibiotics Hives  . Butrans [Buprenorphine] Rash    Patch caused rash, didn't help with pain  . Nucynta [Tapentadol] Nausea And Vomiting  . Propofol Nausea And Vomiting  . Tape Itching, Rash and Other (See Comments)    Please use paper tape  . Toradol [Ketorolac Tromethamine] Anxiety   I reviewed her past medical history, social history, family history, and environmental history and no significant changes have been reported from previous visits.  Review of Systems  Constitutional: Positive for fatigue. Negative for activity change, appetite change, chills, diaphoresis, fever and unexpected weight change.  HENT: Positive for rhinorrhea. Negative for congestion, postnasal drip, sinus pressure and sore throat.   Eyes: Negative for pain, discharge, redness and itching.  Respiratory: Positive for cough. Negative for shortness of breath, wheezing and stridor.   Gastrointestinal: Negative for diarrhea, nausea and vomiting.  Musculoskeletal: Negative for arthralgias, joint swelling and myalgias.  Skin: Negative for rash.  Allergic/Immunologic: Negative for environmental allergies and food allergies.    Objective:  Physical exam not obtained as encounter was done via telephone.   Previous notes and tests were reviewed.  I discussed the assessment and treatment plan with the patient. The patient was provided an opportunity to ask questions and all were answered. The patient agreed with the plan and  demonstrated an understanding of the instructions.   The patient was advised to call back or seek an in-person evaluation if the symptoms worsen or if the condition fails to improve as anticipated.  I provided 15 minutes of non-face-to-face time during this encounter.  It was my pleasure to participate in [place] Finlayson's care today. Please feel free to contact me with any questions or concerns.   Sincerely,  Valentina Shaggy, MD

## 2020-05-29 ENCOUNTER — Other Ambulatory Visit: Payer: Self-pay

## 2020-05-29 ENCOUNTER — Emergency Department (HOSPITAL_COMMUNITY)
Admission: EM | Admit: 2020-05-29 | Discharge: 2020-05-29 | Disposition: A | Discharge status: Home / Self Care | Payer: Medicaid Other | Attending: Emergency Medicine | Admitting: Emergency Medicine

## 2020-05-29 ENCOUNTER — Encounter (HOSPITAL_COMMUNITY): Payer: Self-pay

## 2020-05-29 ENCOUNTER — Emergency Department (HOSPITAL_COMMUNITY): Payer: Medicaid Other

## 2020-05-29 ENCOUNTER — Other Ambulatory Visit: Payer: Self-pay | Admitting: Family Medicine

## 2020-05-29 ENCOUNTER — Telehealth: Payer: Self-pay

## 2020-05-29 DIAGNOSIS — Z79899 Other long term (current) drug therapy: Secondary | ICD-10-CM | POA: Insufficient documentation

## 2020-05-29 DIAGNOSIS — R0602 Shortness of breath: Secondary | ICD-10-CM | POA: Diagnosis present

## 2020-05-29 DIAGNOSIS — R079 Chest pain, unspecified: Secondary | ICD-10-CM

## 2020-05-29 DIAGNOSIS — Z7951 Long term (current) use of inhaled steroids: Secondary | ICD-10-CM | POA: Diagnosis not present

## 2020-05-29 DIAGNOSIS — Z87891 Personal history of nicotine dependence: Secondary | ICD-10-CM | POA: Insufficient documentation

## 2020-05-29 DIAGNOSIS — I10 Essential (primary) hypertension: Secondary | ICD-10-CM | POA: Diagnosis not present

## 2020-05-29 DIAGNOSIS — R0789 Other chest pain: Secondary | ICD-10-CM | POA: Diagnosis not present

## 2020-05-29 DIAGNOSIS — K219 Gastro-esophageal reflux disease without esophagitis: Secondary | ICD-10-CM

## 2020-05-29 DIAGNOSIS — J90 Pleural effusion, not elsewhere classified: Secondary | ICD-10-CM | POA: Diagnosis not present

## 2020-05-29 DIAGNOSIS — R002 Palpitations: Secondary | ICD-10-CM | POA: Insufficient documentation

## 2020-05-29 DIAGNOSIS — R000 Tachycardia, unspecified: Secondary | ICD-10-CM | POA: Diagnosis not present

## 2020-05-29 DIAGNOSIS — R0689 Other abnormalities of breathing: Secondary | ICD-10-CM | POA: Diagnosis not present

## 2020-05-29 DIAGNOSIS — Z8659 Personal history of other mental and behavioral disorders: Secondary | ICD-10-CM | POA: Insufficient documentation

## 2020-05-29 DIAGNOSIS — G4489 Other headache syndrome: Secondary | ICD-10-CM | POA: Diagnosis not present

## 2020-05-29 LAB — CBC
HCT: 37.7 % (ref 36.0–46.0)
Hemoglobin: 13.2 g/dL (ref 12.0–15.0)
MCH: 32 pg (ref 26.0–34.0)
MCHC: 35 g/dL (ref 30.0–36.0)
MCV: 91.3 fL (ref 80.0–100.0)
Platelets: 212 10*3/uL (ref 150–400)
RBC: 4.13 MIL/uL (ref 3.87–5.11)
RDW: 11.9 % (ref 11.5–15.5)
WBC: 5.2 10*3/uL (ref 4.0–10.5)
nRBC: 0 % (ref 0.0–0.2)

## 2020-05-29 LAB — BASIC METABOLIC PANEL
Anion gap: 10 (ref 5–15)
BUN: 22 mg/dL — ABNORMAL HIGH (ref 6–20)
CO2: 20 mmol/L — ABNORMAL LOW (ref 22–32)
Calcium: 9 mg/dL (ref 8.9–10.3)
Chloride: 106 mmol/L (ref 98–111)
Creatinine, Ser: 0.6 mg/dL (ref 0.44–1.00)
GFR calc Af Amer: >60 mL/min (ref >60)
GFR calc non Af Amer: >60 mL/min (ref >60)
Glucose, Bld: 108 mg/dL — ABNORMAL HIGH (ref 70–99)
Potassium: 3.3 mmol/L — ABNORMAL LOW (ref 3.5–5.1)
Sodium: 136 mmol/L (ref 135–145)

## 2020-05-29 LAB — TROPONIN I (HIGH SENSITIVITY): Troponin I (High Sensitivity): <2 ng/L (ref <18)

## 2020-05-29 LAB — D-DIMER, QUANTITATIVE: D-Dimer, Quant: 0.33 ug/mL-FEU (ref 0.00–0.50)

## 2020-05-29 MED ORDER — SODIUM CHLORIDE 0.9 % IV BOLUS
1000.0000 mL | Freq: Once | INTRAVENOUS | Status: AC
  Administered 2020-05-29: 1000 mL via INTRAVENOUS

## 2020-05-29 MED ORDER — OLOPATADINE HCL 0.2 % OP SOLN
1.0000 [drp] | OPHTHALMIC | 5 refills | Status: DC
Start: 2020-05-29 — End: 2020-06-18

## 2020-05-29 NOTE — ED Triage Notes (Signed)
Pt reports onset of mid sternal chest pain, radiates to left arm and left jaw.  Pt states she has not felt well all day, also with headache.  Pt received 1 ntg sl and 324 aspirin en route with minimal improvement.

## 2020-05-29 NOTE — Telephone Encounter (Signed)
Noted.   Jermani Eberlein, MD [facility]  

## 2020-05-29 NOTE — ED Provider Notes (Signed)
[facility] EMERGENCY DEPARTMENT Provider Note   CSN: [id number] Arrival date & time: 05/29/20  1850     History Chief Complaint  Patient presents with  . Chest Pain    Penny [name] is a 34 y.o. female.  HPI   This patient is a 34 year old female, she has known multiple medical problems including a history of gastric bypass, she has had anemia secondary to that, she struggles with depression and anxiety as well as a history of a dysrhythmia which is not currently known as to the source.  She has worn a cardiac monitor in the past but states she does not know what it was.  She was in her usual state of health as she was coming out of a store, she felt like her heart was racing, she was feeling short of breath and had a heaviness on her chest with radiation to the arm and the jaw.  This is since gone away, it seems to come and go, it is been going on for several hours intermittently.  It is not associated with fevers coughing or swelling of the legs, she has no abdominal pain or back pain, she does not currently have a headache though she has been struggling with headaches recently.  The patient notes that when she was in the drive-through at the pharmacy tried to pick up the medication for her headaches her symptoms worsened, she went inside to check her blood pressure and was feeling sweaty, she was told by the staff that they could not get her blood pressure and redirected her to the emergency department.  I have personally reviewed the medical record, the patient has had multiple visits to emergency department in past for a variety of concerns and has some element of chronic abdominal pain, she has had multiple CT scans of the abdomen and pelvis, she is not on any anticoagulants.  Her risk for pulmonary embolism is low, she has not had any recent surgery, no recent travel, no recent sick contacts, no hormones, no history of cancer, no swelling of her legs.  The patient was admitted  in 2020 after a syncopal episode, she had transaminitis, history of opiate addiction, was thought to have a atrial septal defect and thus underwent a repeat echocardiogram which showed a slightly decreased ejection fraction of 50 to 55% but normal left ventricular function, no left ventricular hypertrophy, global right systolic function was normal  In July 2020 she wore a heart rate monitor during which time she had occasional PACs and PVCs, there was no arrhythmias, normal sinus rhythm associated with the patient's duration of telemetry  Past Medical History:  Diagnosis Date  . ADD (attention deficit disorder)   . Anemia 2011   2o to GASTRIC BYPASS  . Anginal pain ([place])   . Anxiety   . Blood transfusion without reported diagnosis   . Chronic daily headache   . Depression   . Depression   . Dysmenorrhea 09/18/2013  . Dysrhythmia   . Elevated liver enzymes JUL 2011ALK PHOS 111-127 AST  143-267 ALT  213-321T BILI 0.6  ALB  3.7-4.06 Jun 2011 ALK PHOS 118 AST 24 ALT 42 T BILI 0.4 ALB 3.9  . Encounter for drug rehabilitation    at behavioral health for opioid addiction about 4 years ago  . Family history of adverse reaction to anesthesia    'dad had to be kept on pump for breathing for morphine'  . Fatty liver   .  Fibroid 01/18/2017  . Fibromyalgia   . Gastric bypass status for obesity   . Gastritis JULY 2011  . Heart murmur   . 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 History of Holter monitoring   . Hx of opioid abuse ([place])    for about 4 years, about 4 years ago  . Interstitial cystitis   . Iron deficiency anemia 07/23/2010  . Irritable bowel syndrome 2012 DIARRHEA   JUN 2012 TTG IgA 14.9  . IUD FEB 2010  . Lupus ([place])   . Menorrhagia 07/18/2013  . Migraines   . Obesity (BMI 30-39.9) 2011 228 LBS BMI 36.8  . Ovarian cyst   . Patient desires pregnancy 09/18/2013  . Polyneuropathy   . PONV (postoperative nausea and vomiting) seizure post-operatively    seizure following ankle surgery  . Potassium (K) deficiency   . Pregnant 12/25/2013  . Psychiatric pseudoseizure   . RLQ abdominal pain 07/18/2013  . Sciatica of left side 11/14/2014  . 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 Stress 09/18/2013    Patient Active Problem List   Diagnosis Date Noted  . Dehydration   . Intractable abdominal pain 09/19/2019  . Intractable nausea and vomiting 09/19/2019  . Gastroesophageal reflux disease without esophagitis   . Cannabis abuse 08/30/2019  . Dyspnea   . Severe anxiety 08/21/2019  . Hematemesis with nausea 04/23/2019  . Absolute anemia 04/23/2019  . Urge incontinence 10/26/2018  . S/P kyphoplasty 08/02/2018  . Herpes genitalis in women 12/19/2017  . Cannabis use disorder, moderate, dependence ([place]) 07/07/2017  . Abnormal urine odor 03/30/2017  . Urinary tract infection with hematuria 03/30/2017  . Screening for genitourinary condition 03/30/2017  . Pain 03/30/2017  . Burning with urination 03/30/2017  . Family history of breast cancer in mother,uncertain BR CA status 02/28/2017  . Fibroid 01/18/2017  . Chronic pain syndrome 12/05/2016  . Folate deficiency 12/04/2016  . Nausea vomiting and diarrhea 12/04/2016  . Severe anemia 12/04/2016  . Symptomatic anemia 12/03/2016  . Anastomotic ulcer 04/22/2016  . Pain medication agreement signed 04/22/2016  . Complaints of leg weakness   . Paresthesia   . Left-sided weakness   . Uncontrolled pain 01/24/2016  . Malnutrition of moderate degree 01/24/2016  . Neuropathy 01/23/2016  . Inability to walk 01/23/2016  . Paresthesias   . Bilateral leg numbness 11/26/2015  . 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 Post partum depression 05/09/2015  . Dysphagia   . Hematemesis 04/07/2015  . Intractable vomiting with nausea 04/07/2015  . Elevated liver enzymes   . Epigastric pain   . Pseudoseizure ([place]) 02/22/2015  . Seizure-like  activity ([place]) 02/22/2015  . Fibromyalgia 02/18/2015  . Vulvar fissure 02/18/2015  . Clinical depression 02/01/2015  . Current smoker 01/29/2015  . Current tobacco use 01/29/2015  . 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 Leg weakness, bilateral 01/02/2015  . Gait disorder 01/02/2015  . Other symptoms and signs involving the musculoskeletal system 01/02/2015  . Abnormal gait 01/02/2015  . Cervicalgia 12/18/2014  . Sciatica of left side 11/14/2014  . Neuralgia neuritis, sciatic nerve 11/14/2014  . 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 Encounter for sterilization 09/09/2014  . Abdominal pain, acute, right lower quadrant 08/22/2014  . Headache, migraine 08/19/2014  . Seizure ([place]) 08/18/2014  . Encephalopathy 08/13/2014  . Headache 08/13/2014  . Altered mental status 08/13/2014  . Right leg numbness 07/31/2014  . Disturbance of skin sensation 07/31/2014  . Hypertension in  pregnancy, transient 07/08/2014  . Previous gastric bypass affecting pregnancy, antepartum 05/22/2014  . History of bariatric surgery 05/22/2014  . 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 Antepartum mental disorder in pregnancy 05/09/2014  . Rapid palpitations 02/18/2014  . H/O maternal third degree perineal laceration, currently pregnant 02/18/2014  . High-risk pregnancy 02/18/2014  . Supervision of pregnancy with other poor reproductive or obstetric history, unspecified trimester 02/18/2014  . Restless legs 01/16/2014  . Restless leg 01/16/2014  . Chronic interstitial cystitis 10/23/2013  . Menorrhagia 07/18/2013  . Excessive and frequent menstruation 07/18/2013  . h/o Opiate addiction 03/11/2012    Class: Acute  . History of migraine headaches 03/10/2012    Class: Acute  . Depression with anxiety 03/10/2012    Class: Chronic  . 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  Attention deficit hyperactivity disorder, 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    Class: Chronic  . Panic disorder without agoraphobia 03/10/2012  . H/O disease 03/10/2012  . Dysthymia 03/10/2012  . 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 Other specified behavioral and emotional disorders with onset usually occurring in childhood and adolescence 03/10/2012  . Pelvic congestion syndrome 10/13/2011  . Coitalgia 10/13/2011  . Chronic migraine without aura 10/13/2011  . Unspecified dyspareunia 10/13/2011  . Other specified conditions associated with female genital organs and menstrual cycle 10/13/2011  . IBS (irritable bowel syndrome) 08/25/2011  . Diarrhea 05/27/2011  . OBESITY, UNSPECIFIED 09/17/2010  . Transaminitis 09/17/2010  . IDA (iron deficiency anemia) 07/23/2010    Past Surgical History:  Procedure Laterality Date  . BIOPSY  04/10/2015   Procedure: BIOPSY;  Surgeon: Daneil Dolin, MD;  Location: [facility];  Service: Endoscopy;;  . cath self every nite     for sodium bicarb injection (discontinued 2013)  . CHOLECYSTECTOMY  2005   biliary dyskinesia  . COLONOSCOPY  JUN 2012 ABD PN/DIARRHEA WITH PROPOFOL   NL COLON  . DILATION AND CURETTAGE OF UTERUS    . DILITATION & CURRETTAGE/HYSTROSCOPY WITH NOVASURE ABLATION N/A 03/24/2017   Procedure: DILATATION & CURETTAGE/HYSTEROSCOPY WITH NOVASURE ENDOMETRIAL ABLATION;  Surgeon: Jonnie Kind, MD;  Location: [facility];  Service: Gynecology;  Laterality: N/A;  . ESOPHAGEAL DILATION N/A 04/10/2015   Procedure: ESOPHAGEAL DILATION WITH 54FR MALONEY DILATOR;  Surgeon: Daneil Dolin, MD;  Location: [facility];  Service: Endoscopy;  Laterality: N/A;  . ESOPHAGOGASTRODUODENOSCOPY    . ESOPHAGOGASTRODUODENOSCOPY (EGD) WITH PROPOFOL N/A 04/10/2015   Procedure: ESOPHAGOGASTRODUODENOSCOPY (EGD) WITH PROPOFOL;  Surgeon: Daneil Dolin, MD;  Location: [facility];  Service: Endoscopy;  Laterality: N/A;  . ESOPHAGOGASTRODUODENOSCOPY (EGD)  WITH PROPOFOL N/A 12/06/2016   Procedure: ESOPHAGOGASTRODUODENOSCOPY (EGD) WITH PROPOFOL;  Surgeon: Danie Binder, MD;  [facility];  Service: Endoscopy;  Laterality: N/A;  . ESOPHAGOGASTRODUODENOSCOPY (EGD) WITH PROPOFOL N/A 04/27/2019   Procedure: ESOPHAGOGASTRODUODENOSCOPY (EGD) WITH PROPOFOL;  Surgeon: Rogene Houston, MD;  [facility];  Service: Endoscopy;  Laterality: N/A;  730  . ESOPHAGOGASTRODUODENOSCOPY (EGD) WITH PROPOFOL N/A 08/31/2019   Procedure: ESOPHAGOGASTRODUODENOSCOPY (EGD) WITH PROPOFOL;  Surgeon: Rogene Houston, MD;  [facility];  Service: Endoscopy;  Laterality: N/A;  . GAB  2007   in [place]-POUCH 5 CM  . GASTRIC BYPASS  06/2006  . HYSTEROSCOPY WITH D & C N/A 09/12/2014   Procedure: DILATATION AND CURETTAGE /HYSTEROSCOPY;  Surgeon: Jonnie Kind, MD;  Location: [facility];  Service: Gynecology;  Laterality: N/A;  . KYPHOPLASTY N/A 08/02/2018   Procedure: KYPHOPLASTY T12;  Surgeon: Melina Schools, MD;  Location: [place];  Service: Orthopedics;  Laterality: N/A;  60 mins  . LAPAROSCOPIC TUBAL LIGATION Bilateral 03/24/2017   Procedure: LAPAROSCOPIC TUBAL LIGATION ([place] Rings);  Surgeon: Jonnie Kind, MD;  Location: [facility];  Service: Gynecology;  Laterality: Bilateral;  . REPAIR VAGINAL CUFF N/A 07/30/2014   Procedure: REPAIR VAGINAL CUFF;  Surgeon: Mora Bellman, MD;  Location: [facility];  Service: Gynecology;  Laterality: N/A;  . SAVORY DILATION  06/20/2012   Dr. Barnie Alderman gastritis/Ulcer in the mid jejunum. Empiric dilation.   . SURAL NERVE BX Left 02/25/2016   Procedure: LEFT SURAL NERVE BIOPSY;  Surgeon: Jovita Gamma, MD;  Location: [place] [facility];  Service: Neurosurgery;  Laterality: Left;  Left sural nerve biopsy  . TONSILLECTOMY    . TONSILLECTOMY AND ADENOIDECTOMY    . UPPER GASTROINTESTINAL ENDOSCOPY  JULY 2011 NAUSEA-D125,V6, PH 25   Bx; GASTRITIS, POUCH-5 CM LONG  . WISDOM TOOTH EXTRACTION       OB History    Gravida  3     Para  2   Term  1   Preterm  1   AB  1   Living  2     SAB  1   TAB      Ectopic      Multiple      Live Births  2           Family History  Problem Relation Age of Onset  . Hemochromatosis Maternal Grandmother   . Migraines Maternal Grandmother   . Cancer Maternal Grandmother   . Breast cancer Maternal Grandmother   . Hypertension Father   . Diabetes Father   . Coronary artery disease Father   . Migraines Paternal Grandmother   . Breast cancer Paternal Grandmother   . Cancer Mother        breast  . Hemochromatosis Mother   . Breast cancer Mother   . Depression Mother   . Anxiety disorder Mother   . Coronary artery disease Paternal Grandfather   . Anxiety disorder Brother   . Bipolar disorder Brother   . Healthy Daughter   . Healthy Son     Social History   Tobacco Use  . Smoking status: Former Smoker    Packs/day: 0.25    Years: 6.00    Pack years: 1.50    Types: E-cigarettes    Start date: 05/2018  . Smokeless tobacco: Never Used  Vaping Use  . Vaping Use: Former  Substance Use Topics  . Alcohol use: Yes    Alcohol/week: 0.0 standard drinks    Comment: 1-2 beers a couple times a month   . Drug use: Not Currently    Types: Marijuana    Comment: Previously opioid addiction went through rehab    Home Medications Prior to Admission medications   Medication Sig Start Date End Date Taking? Authorizing Provider  albuterol (PROVENTIL) (2.5 MG/3ML) 0.083% nebulizer solution Take 3 mLs (2.5 mg total) by nebulization every 4 (four) hours as needed for wheezing. 10/08/19  Yes Luking, Elayne Snare, MD  amphetamine-dextroamphetamine (ADDERALL XR) 30 MG 24 hr capsule Take 1 capsule (30 mg total) by mouth daily. 04/15/20  Yes Cloria Spring, MD  budesonide-formoterol [facility]) 160-4.5 MCG/ACT inhaler Inhale 2 puffs into the lungs 2 (two) times daily. 05/23/20  Yes Valentina Shaggy, MD  cetirizine [facility]thold)  10 MG tablet Take 1 tablet (10 mg total) by  mouth daily. 05/23/20  Yes Valentina Shaggy, MD  clonazePAM (KLONOPIN) 0.5 MG tablet Take 1 tablet (0.5 mg total) by mouth 2 (two) times daily as needed for anxiety. 04/15/20 04/15/21 Yes Cloria Spring, MD  DULoxetine (CYMBALTA) 60 MG capsule Take 1 capsule (60 mg total) by mouth 2 (two) times daily. 04/15/20  Yes Cloria Spring, MD  Erenumab-aooe (AIMOVIG) 70 MG/ML SOAJ Inject 70 mg into the skin every 28 (twenty-eight) days. 02/21/20  Yes Patel, Donika K, DO  HYDROmorphone (DILAUDID) 2 MG tablet Take 1-2 tablets po q 4 hrs prn pain no greater than 9 tablets/day Patient taking differently: 4 mg every 4 (four) hours as needed for severe pain. Take 1-2 tablets po q 4 hrs prn pain no greater than 9 tablets/day 10/15/19  Yes Luking, Elayne Snare, MD  levalbuterol (XOPENEX HFA) 45 MCG/ACT inhaler Inhale 2 puffs into the lungs every 6 (six) hours as needed for wheezing. 10/25/19  Yes Valentina Shaggy, MD  montelukast (SINGULAIR) 10 MG tablet Take 1 tablet (10 mg total) by mouth at bedtime. 02/15/20  Yes Valentina Shaggy, MD  ondansetron (ZOFRAN-ODT) 8 MG disintegrating tablet DISSOLVE 1 TABLET IN MOUTH EVERY 8 HOURS AS NEEDED. 05/13/20  Yes Luking, Elayne Snare, MD  pantoprazole (PROTONIX) 40 MG tablet Take 1 tablet (40 mg total) by mouth 2 (two) times daily before a meal. 11/20/19  Yes Luking, Elayne Snare, MD  phenazopyridine (PYRIDIUM) 200 MG tablet Take 200 mg by mouth daily as needed.  02/23/10  Yes [provider]  promethazine (PHENERGAN) 25 MG tablet TAKE (1) TABLET BY MOUTH TWICE A DAY AS NEEDED. Patient taking differently: Take 25 mg by mouth. TAKE (1) TABLET BY MOUTH TWICE A DAY AS NEEDED. 04/14/20  Yes Luking, Elayne Snare, MD  topiramate (TOPAMAX) 100 MG tablet Take 1 tablet (100 mg total) by mouth 2 (two) times daily. 04/15/20 04/15/21 Yes Cloria Spring, MD  traZODone (DESYREL) 150 MG tablet Take 1 tablet (150 mg total) by mouth at bedtime. 04/15/20  Yes Cloria Spring, MD    amphetamine-dextroamphetamine (ADDERALL XR) 30 MG 24 hr capsule Take 1 capsule (30 mg total) by mouth every morning. Patient not taking: Reported on 05/29/2020 04/15/20   Cloria Spring, MD  fluticasone [facility]) 50 MCG/ACT nasal spray Place 2 sprays into both nostrils daily. Patient not taking: Reported on 05/29/2020 05/23/20   Valentina Shaggy, MD  Olopatadine HCl (PATADAY) 0.2 % SOLN Place 1 drop into both eyes 1 day or 1 dose. Patient not taking: Reported on 05/29/2020 05/29/20   Valentina Shaggy, MD  ondansetron (ZOFRAN-ODT) 4 MG disintegrating tablet Take 1 tablet (4 mg total) by mouth every 8 (eight) hours as needed for nausea or vomiting. Patient not taking: Reported on 05/29/2020 09/20/19   Barton Dubois, MD    Allergies    Gabapentin, Metoclopramide hcl, Other, Tramadol, Ativan [lorazepam], Latex, Lyrica [pregabalin], Trazodone and nefazodone, Zofran [ondansetron], Sulfa antibiotics, Butrans [buprenorphine], Nucynta [tapentadol], Propofol, Tape, and Toradol [ketorolac tromethamine]  Review of Systems   Review of Systems  All other systems reviewed and are negative.   Physical Exam Updated Vital Signs BP 116/90 (BP Location: Left Arm)   Pulse (!) 118   Temp 98.3 F (36.8 C) (Oral)   Resp 15   Ht 1.676 m ('5\' 6"'$ )   Wt 85.3 kg   SpO2 99%   BMI 30.34 kg/m   Physical Exam  Vitals and nursing note reviewed.  Constitutional:      General: She is not in acute distress.    Appearance: She is well-developed.  HENT:     Head: Normocephalic and atraumatic.     Mouth/Throat:     Pharynx: No oropharyngeal exudate.  Eyes:     General: No scleral icterus.       Right eye: No discharge.        Left eye: No discharge.     Conjunctiva/sclera: Conjunctivae normal.     Pupils: Pupils are equal, round, and reactive to light.  Neck:     Thyroid: No thyromegaly.     Vascular: No JVD.  Cardiovascular:     Rate and Rhythm: Regular rhythm. Tachycardia present.     Heart sounds:  Normal heart sounds. No murmur heard.  No friction rub. No gallop.      Comments: Monitor reveals mild sinus tachycardia at 105 bpm, normal pulses at the radial arteries, no JVD or peripheral edema Pulmonary:     Effort: Pulmonary effort is normal. No respiratory distress.     Breath sounds: Normal breath sounds. No wheezing or rales.  Abdominal:     General: Bowel sounds are normal. There is no distension.     Palpations: Abdomen is soft. There is no mass.     Tenderness: There is no abdominal tenderness.  Musculoskeletal:        General: No tenderness. Normal range of motion.     Cervical back: Normal range of motion and neck supple.  Lymphadenopathy:     Cervical: No cervical adenopathy.  Skin:    General: Skin is warm and dry.     Findings: No erythema or rash.  Neurological:     Mental Status: She is alert.     Coordination: Coordination normal.  Psychiatric:        Behavior: Behavior normal.     ED Results / Procedures / Treatments   Labs (all labs ordered are listed, but only abnormal results are displayed) Labs Reviewed  BASIC METABOLIC PANEL - Abnormal; Notable for the following components:      Result Value   Potassium 3.3 (*)    CO2 20 (*)    Glucose, Bld 108 (*)    BUN 22 (*)    All other components within normal limits  CBC  D-DIMER, QUANTITATIVE (NOT AT [facility])  TROPONIN I (HIGH SENSITIVITY)    EKG EKG Interpretation  Date/Time:  Thursday May 29 2020 18:58:36 EDT Ventricular Rate:  129 PR Interval:    QRS Duration: 85 QT Interval:  297 QTC Calculation: 435 R Axis:   66 Text Interpretation: Sinus tachycardia with irregular rate Borderline T abnormalities, diffuse leads since last tracing no significant change Confirmed by Noemi Chapel [id number]) on 05/29/2020 7:34:52 PM   Radiology DG Chest Portable 1 View  Result Date: 05/29/2020 CLINICAL DATA:  Chest pain. EXAM: PORTABLE CHEST 1 VIEW COMPARISON:  Chest x-ray dated September 19, 2019. FINDINGS: The  heart size and mediastinal contours are within normal limits. Normal pulmonary vascularity. No focal consolidation, pleural effusion, or pneumothorax. No acute osseous abnormality. Prior T12 kyphoplasty. IMPRESSION: No active disease. Electronically Signed   By: Titus Dubin M.D.   On: 05/29/2020 19:46    Procedures Procedures (including critical care time)  Medications Ordered in ED Medications  sodium chloride 0.9 % bolus 1,000 mL (1,000 mLs Intravenous New Bag/Given 05/29/20 2016)    ED Course  I have reviewed the triage vital  signs and the nursing notes.  Pertinent labs & imaging results that were available during my care of the patient were reviewed by me and considered in my medical decision making (see chart for details).    MDM Rules/Calculators/A&P                          The patient will need a D-dimer, testing for possible pulmonary embolism given the shortness of breath and chest pain with her palpitations.  She does not have a pathologic rhythm at this time and a mild sinus tachycardia.  She appears to be somewhat anxious, she does get a little bit lightheaded when she sits up in the bed but this does not correlate with an increase in her heart rate or decrease in her blood pressure with my bedside exam.  She is agreeable to the plan  D-dimer is negative, patient is asking for oral Dilaudid as that is what she takes multiple times per day  Labs have been unremarkable including D-dimer, troponin, CBC showing no anemia and metabolic panel which was totally unremarkable as well.  Chest x-ray showed no signs of abnormalities, EKG was reassuring, the patient stable for discharge.  Final Clinical Impression(s) / ED Diagnoses Final diagnoses:  Chest pain, unspecified type  Palpitations    Rx / DC Orders ED Discharge Orders    None       Noemi Chapel, MD 05/29/20 2108

## 2020-05-29 NOTE — Discharge Instructions (Addendum)
You may take your pain medication as soon as you get home, you did not have any signs of blood clot, heart attack or any other abnormalities.  Your chest x-ray was clear, your blood work was normal and your EKG showed no problems with your heart.  Please follow-up with your doctor in the clinic for further evaluation.  Emergency department for severe or worsening symptoms

## 2020-05-29 NOTE — ED Notes (Signed)
Pt called out and states she wants to go home. Pt states she wants pain medication. Informed pt that EDP is aware. Pt informed she can go against medical advise if she wishes. Pt states she will think about it and let us know.

## 2020-05-29 NOTE — Telephone Encounter (Signed)
Patanol 0.1 % is not covered by patient's insurance. Will send in Pataday 0.2% per pharmacy request.

## 2020-05-31 ENCOUNTER — Emergency Department (HOSPITAL_COMMUNITY)
Admission: EM | Admit: 2020-05-31 | Discharge: 2020-06-01 | Disposition: A | Discharge status: Home / Self Care | Payer: Medicaid Other | Attending: Emergency Medicine | Admitting: Emergency Medicine

## 2020-05-31 ENCOUNTER — Emergency Department (HOSPITAL_COMMUNITY): Payer: Medicaid Other

## 2020-05-31 DIAGNOSIS — Z5321 Procedure and treatment not carried out due to patient leaving prior to being seen by health care provider: Secondary | ICD-10-CM | POA: Insufficient documentation

## 2020-05-31 DIAGNOSIS — R000 Tachycardia, unspecified: Secondary | ICD-10-CM | POA: Insufficient documentation

## 2020-05-31 DIAGNOSIS — R0602 Shortness of breath: Secondary | ICD-10-CM | POA: Diagnosis not present

## 2020-05-31 DIAGNOSIS — R079 Chest pain, unspecified: Secondary | ICD-10-CM | POA: Insufficient documentation

## 2020-05-31 LAB — BASIC METABOLIC PANEL
Anion gap: 11 (ref 5–15)
BUN: 13 mg/dL (ref 6–20)
CO2: 18 mmol/L — ABNORMAL LOW (ref 22–32)
Calcium: 9.5 mg/dL (ref 8.9–10.3)
Chloride: 110 mmol/L (ref 98–111)
Creatinine, Ser: 0.66 mg/dL (ref 0.44–1.00)
GFR calc Af Amer: >60 mL/min (ref >60)
GFR calc non Af Amer: >60 mL/min (ref >60)
Glucose, Bld: 111 mg/dL — ABNORMAL HIGH (ref 70–99)
Potassium: 3.9 mmol/L (ref 3.5–5.1)
Sodium: 139 mmol/L (ref 135–145)

## 2020-05-31 LAB — CBC
HCT: 40.2 % (ref 36.0–46.0)
Hemoglobin: 14 g/dL (ref 12.0–15.0)
MCH: 31.7 pg (ref 26.0–34.0)
MCHC: 34.8 g/dL (ref 30.0–36.0)
MCV: 91 fL (ref 80.0–100.0)
Platelets: 297 10*3/uL (ref 150–400)
RBC: 4.42 MIL/uL (ref 3.87–5.11)
RDW: 11.9 % (ref 11.5–15.5)
WBC: 6.6 10*3/uL (ref 4.0–10.5)
nRBC: 0 % (ref 0.0–0.2)

## 2020-05-31 LAB — TROPONIN I (HIGH SENSITIVITY): Troponin I (High Sensitivity): <2 ng/L (ref <18)

## 2020-05-31 LAB — I-STAT BETA HCG BLOOD, ED (MC, WL, AP ONLY): I-stat hCG, quantitative: <5 m[IU]/mL (ref <5)

## 2020-05-31 MED ORDER — SODIUM CHLORIDE 0.9% FLUSH: 3.0000 mL | Freq: Once | INTRAVENOUS | Status: DC

## 2020-05-31 NOTE — ED Triage Notes (Signed)
The pt is c/o chest pain for several days with some runs of tachycardia  She was seen  At [facility]  The 24th of this month

## 2020-06-01 NOTE — ED Notes (Signed)
Pt decided to leave without being seen.

## 2020-06-02 ENCOUNTER — Other Ambulatory Visit (INDEPENDENT_AMBULATORY_CARE_PROVIDER_SITE_OTHER): Payer: Self-pay | Admitting: *Deleted

## 2020-06-02 ENCOUNTER — Encounter (INDEPENDENT_AMBULATORY_CARE_PROVIDER_SITE_OTHER): Payer: Self-pay | Admitting: *Deleted

## 2020-06-02 DIAGNOSIS — D508 Other iron deficiency anemias: Secondary | ICD-10-CM

## 2020-06-03 ENCOUNTER — Telehealth: Payer: Self-pay | Admitting: Cardiovascular Disease

## 2020-06-03 DIAGNOSIS — D508 Other iron deficiency anemias: Secondary | ICD-10-CM | POA: Diagnosis not present

## 2020-06-03 NOTE — Telephone Encounter (Signed)
Patient was seen in [facility] on 6/24 having chest pain goes up to jaw and down left arm, feels like something is sitting on her chest making it hard to breath, really tired, sweating, increased heart rate. Would like for a nurse or Dr. Johnsie Cancel to give her a call [phone]

## 2020-06-04 LAB — IRON,TIBC AND FERRITIN PANEL
Ferritin: 38 ng/mL (ref 15–150)
Iron Saturation: 32 % (ref 15–55)
Iron: 92 ug/dL (ref 27–159)
Total Iron Binding Capacity: 291 ug/dL (ref 250–450)
UIBC: 199 ug/dL (ref 131–425)

## 2020-06-04 LAB — HEMOGLOBIN AND HEMATOCRIT, BLOOD
Hematocrit: 41 % (ref 34.0–46.6)
Hemoglobin: 13.9 g/dL (ref 11.1–15.9)

## 2020-06-05 DIAGNOSIS — G8929 Other chronic pain: Secondary | ICD-10-CM | POA: Diagnosis not present

## 2020-06-05 DIAGNOSIS — M5442 Lumbago with sciatica, left side: Secondary | ICD-10-CM | POA: Diagnosis not present

## 2020-06-05 DIAGNOSIS — Z79899 Other long term (current) drug therapy: Secondary | ICD-10-CM | POA: Diagnosis not present

## 2020-06-05 DIAGNOSIS — M5441 Lumbago with sciatica, right side: Secondary | ICD-10-CM | POA: Diagnosis not present

## 2020-06-06 ENCOUNTER — Other Ambulatory Visit (INDEPENDENT_AMBULATORY_CARE_PROVIDER_SITE_OTHER): Payer: Self-pay | Admitting: *Deleted

## 2020-06-06 ENCOUNTER — Encounter (INDEPENDENT_AMBULATORY_CARE_PROVIDER_SITE_OTHER): Payer: Self-pay | Admitting: *Deleted

## 2020-06-06 DIAGNOSIS — D508 Other iron deficiency anemias: Secondary | ICD-10-CM

## 2020-06-06 NOTE — Progress Notes (Signed)
Labs have been ordered for 3 months.

## 2020-06-16 ENCOUNTER — Encounter (HOSPITAL_COMMUNITY): Payer: Self-pay | Admitting: Psychiatry

## 2020-06-16 ENCOUNTER — Ambulatory Visit: Payer: Medicaid Other | Admitting: Family Medicine

## 2020-06-16 ENCOUNTER — Other Ambulatory Visit: Payer: Self-pay

## 2020-06-16 ENCOUNTER — Telehealth (INDEPENDENT_AMBULATORY_CARE_PROVIDER_SITE_OTHER): Payer: Medicaid Other | Admitting: Psychiatry

## 2020-06-16 DIAGNOSIS — F332 Major depressive disorder, recurrent severe without psychotic features: Secondary | ICD-10-CM | POA: Diagnosis not present

## 2020-06-16 DIAGNOSIS — F900 Attention-deficit hyperactivity disorder, predominantly inattentive type: Secondary | ICD-10-CM | POA: Diagnosis not present

## 2020-06-16 DIAGNOSIS — F411 Generalized anxiety disorder: Secondary | ICD-10-CM | POA: Diagnosis not present

## 2020-06-16 MED ORDER — TRAZODONE HCL 150 MG PO TABS: 150.0000 mg | ORAL_TABLET | Freq: Every day | ORAL | 2 refills | Status: DC

## 2020-06-16 MED ORDER — TOPIRAMATE 100 MG PO TABS: 100.0000 mg | ORAL_TABLET | Freq: Two times a day (BID) | ORAL | 2 refills | Status: DC

## 2020-06-16 MED ORDER — BUSPIRONE HCL 5 MG PO TABS: 5.0000 mg | ORAL_TABLET | Freq: Three times a day (TID) | ORAL | 2 refills | Status: DC

## 2020-06-16 MED ORDER — DULOXETINE HCL 60 MG PO CPEP: 60.0000 mg | ORAL_CAPSULE | Freq: Two times a day (BID) | ORAL | 2 refills | Status: DC

## 2020-06-16 MED ORDER — AMPHETAMINE-DEXTROAMPHET ER 30 MG PO CP24: 30.0000 mg | ORAL_CAPSULE | Freq: Every day | ORAL | 0 refills | Status: DC

## 2020-06-16 MED ORDER — CLONAZEPAM 0.5 MG PO TABS: 0.5000 mg | ORAL_TABLET | Freq: Two times a day (BID) | ORAL | 2 refills | Status: DC | PRN

## 2020-06-16 NOTE — Progress Notes (Deleted)
Cardiology Office Note  Date: 06/16/2020   ID: Penny [name], DOB July 10, 1986, MRN [id number]  PCP:  Babs Sciara, MD  Cardiologist:  No primary care provider on file. Electrophysiologist:  None   Chief Complaint: Palpitations  History of Present Illness: Penny [name] is a 34 y.o. female with a history of palpitations, iron deficiency anemia, status post bariatric surgery 13 years ago.  History of atypical chest pain likely related to her GI problems with negative troponins.  Last seen by Dr. Eden Emms 05/15/2019.  She had been complaining of chest pain that was constant for hours and chest, not exertional, right and left-sided.  She had an echo done in 2015 for syncope that was normal except for possible PFO.  She complained of having palpitations for 2-1/2 months daily and feeling faint.  Zio patch monitor and echo were ordered.  Thyroid panel was ordered.  Recent ER visit to [facility] on 05/29/2020 with c/o CP with radiation to arm and jaw with associated shortness of breath and chest heaviness. She ruled out for ACS. D dimer, Tropinin were negative. CBC no anemia, CXR no acute process, ekg sinus tachycardia but no pathologic rhythms. She was discharged from ED.    Past Medical History:  Diagnosis Date  . ADD (attention deficit disorder)   . Anemia 2011   2o to GASTRIC BYPASS  . Anginal pain (HCC)   . Anxiety   . Blood transfusion without reported diagnosis   . Chronic daily headache   . Depression   . Depression   . Dysmenorrhea 09/18/2013  . Dysrhythmia   . Elevated liver enzymes JUL 2011ALK PHOS 111-127 AST  143-267 ALT  213-321T BILI 0.6  ALB  3.7-4.06 Jun 2011 ALK PHOS 118 AST 24 ALT 42 T BILI 0.4 ALB 3.9  . Encounter for drug rehabilitation    at behavioral health for opioid addiction about 4 years ago  . Family history of adverse reaction to anesthesia    'dad had to be kept on pump for breathing for morphine'  . Fatty liver   . Fibroid 01/18/2017  .  Fibromyalgia   . Gastric bypass status for obesity   . Gastritis JULY 2011  . Heart murmur   . 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 History of Holter monitoring   . Hx of opioid abuse (HCC)    for about 4 years, about 4 years ago  . Interstitial cystitis   . Iron deficiency anemia 07/23/2010  . Irritable bowel syndrome 2012 DIARRHEA   JUN 2012 TTG IgA 14.9  . IUD FEB 2010  . Lupus (HCC)   . Menorrhagia 07/18/2013  . Migraines   . Obesity (BMI 30-39.9) 2011 228 LBS BMI 36.8  . Ovarian cyst   . Patient desires pregnancy 09/18/2013  . Polyneuropathy   . PONV (postoperative nausea and vomiting) seizure post-operatively   seizure following ankle surgery  . Potassium (K) deficiency   . Pregnant 12/25/2013  . Psychiatric pseudoseizure   . RLQ abdominal pain 07/18/2013  . Sciatica of left side 11/14/2014  . 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 Stress 09/18/2013    Past Surgical History:  Procedure Laterality Date  . BIOPSY  04/10/2015   Procedure: BIOPSY;  Surgeon: Corbin Ade, MD;  Location: [facility];  Service: Endoscopy;;  . cath self every nite     for sodium bicarb injection (discontinued 2013)  . CHOLECYSTECTOMY  2005   biliary dyskinesia  .  COLONOSCOPY  JUN 2012 ABD PN/DIARRHEA WITH PROPOFOL   NL COLON  . DILATION AND CURETTAGE OF UTERUS    . DILITATION & CURRETTAGE/HYSTROSCOPY WITH NOVASURE ABLATION N/A 03/24/2017   Procedure: DILATATION & CURETTAGE/HYSTEROSCOPY WITH NOVASURE ENDOMETRIAL ABLATION;  Surgeon: Jonnie Kind, MD;  Location: [facility];  Service: Gynecology;  Laterality: N/A;  . ESOPHAGEAL DILATION N/A 04/10/2015   Procedure: ESOPHAGEAL DILATION WITH 54FR MALONEY DILATOR;  Surgeon: Daneil Dolin, MD;  Location: [facility];  Service: Endoscopy;  Laterality: N/A;  . ESOPHAGOGASTRODUODENOSCOPY    . ESOPHAGOGASTRODUODENOSCOPY (EGD) WITH PROPOFOL N/A 04/10/2015   Procedure: ESOPHAGOGASTRODUODENOSCOPY (EGD) WITH PROPOFOL;  Surgeon: Daneil Dolin,  MD;  Location: [facility];  Service: Endoscopy;  Laterality: N/A;  . ESOPHAGOGASTRODUODENOSCOPY (EGD) WITH PROPOFOL N/A 12/06/2016   Procedure: ESOPHAGOGASTRODUODENOSCOPY (EGD) WITH PROPOFOL;  Surgeon: Danie Binder, MD;  Location: [facility];  Service: Endoscopy;  Laterality: N/A;  . ESOPHAGOGASTRODUODENOSCOPY (EGD) WITH PROPOFOL N/A 04/27/2019   Procedure: ESOPHAGOGASTRODUODENOSCOPY (EGD) WITH PROPOFOL;  Surgeon: Rogene Houston, MD;  Location: [facility];  Service: Endoscopy;  Laterality: N/A;  730  . ESOPHAGOGASTRODUODENOSCOPY (EGD) WITH PROPOFOL N/A 08/31/2019   Procedure: ESOPHAGOGASTRODUODENOSCOPY (EGD) WITH PROPOFOL;  Surgeon: Rogene Houston, MD;  Location: [facility];  Service: Endoscopy;  Laterality: N/A;  . GAB  2007   in [place]-POUCH 5 CM  . GASTRIC BYPASS  06/2006  . HYSTEROSCOPY WITH D & C N/A 09/12/2014   Procedure: DILATATION AND CURETTAGE /HYSTEROSCOPY;  Surgeon: Jonnie Kind, MD;  Location: [facility];  Service: Gynecology;  Laterality: N/A;  . KYPHOPLASTY N/A 08/02/2018   Procedure: KYPHOPLASTY T12;  Surgeon: Melina Schools, MD;  Location: [facility];  Service: Orthopedics;  Laterality: N/A;  60 mins  . LAPAROSCOPIC TUBAL LIGATION Bilateral 03/24/2017   Procedure: LAPAROSCOPIC TUBAL LIGATION ([place] Rings);  Surgeon: Jonnie Kind, MD;  Location: [facility];  Service: Gynecology;  Laterality: Bilateral;  . REPAIR VAGINAL CUFF N/A 07/30/2014   Procedure: REPAIR VAGINAL CUFF;  Surgeon: Mora Bellman, MD;  Location: [facility];  Service: Gynecology;  Laterality: N/A;  . SAVORY DILATION  06/20/2012   Dr. Barnie Alderman gastritis/Ulcer in the mid jejunum. Empiric dilation.   . SURAL NERVE BX Left 02/25/2016   Procedure: LEFT SURAL NERVE BIOPSY;  Surgeon: Jovita Gamma, MD;  Location: [facility];  Service: Neurosurgery;  Laterality: Left;  Left sural nerve biopsy  . TONSILLECTOMY    . TONSILLECTOMY AND ADENOIDECTOMY    . UPPER GASTROINTESTINAL ENDOSCOPY  JULY 2011 NAUSEA-D125,V6, PH 25     Bx; GASTRITIS, POUCH-5 CM LONG  . WISDOM TOOTH EXTRACTION      Current Outpatient Medications  Medication Sig Dispense Refill  . albuterol (PROVENTIL) (2.5 MG/3ML) 0.083% nebulizer solution Take 3 mLs (2.5 mg total) by nebulization every 4 (four) hours as needed for wheezing. 150 mL 12  . amphetamine-dextroamphetamine (ADDERALL XR) 30 MG 24 hr capsule Take 1 capsule (30 mg total) by mouth daily. 30 capsule 0  . amphetamine-dextroamphetamine (ADDERALL XR) 30 MG 24 hr capsule Take 1 capsule (30 mg total) by mouth every morning. (Patient not taking: Reported on 05/29/2020) 30 capsule 0  . budesonide-formoterol (SYMBICORT) 160-4.5 MCG/ACT inhaler Inhale 2 puffs into the lungs 2 (two) times daily. 1 Inhaler 5  . cetirizine (ZYRTEC) 10 MG tablet Take 1 tablet (10 mg total) by mouth daily. 30 tablet 5  . clonazePAM (KLONOPIN) 0.5 MG tablet Take 1 tablet (0.5 mg total) by mouth 2 (two) times daily  as needed for anxiety. 60 tablet 2  . DULoxetine (CYMBALTA) 60 MG capsule Take 1 capsule (60 mg total) by mouth 2 (two) times daily. 180 capsule 2  . Erenumab-aooe (AIMOVIG) 70 MG/ML SOAJ Inject 70 mg into the skin every 28 (twenty-eight) days. 1 pen 11  . fluticasone (FLONASE) 50 MCG/ACT nasal spray Place 2 sprays into both nostrils daily. (Patient not taking: Reported on 05/29/2020) 1 g 5  . HYDROmorphone (DILAUDID) 2 MG tablet Take 1-2 tablets po q 4 hrs prn pain no greater than 9 tablets/day (Patient taking differently: 4 mg every 4 (four) hours as needed for severe pain. Take 1-2 tablets po q 4 hrs prn pain no greater than 9 tablets/day) 65 tablet 0  . levalbuterol (XOPENEX HFA) 45 MCG/ACT inhaler Inhale 2 puffs into the lungs every 6 (six) hours as needed for wheezing. 1 Inhaler 5  . montelukast (SINGULAIR) 10 MG tablet Take 1 tablet (10 mg total) by mouth at bedtime. 30 tablet 5  . Olopatadine HCl (PATADAY) 0.2 % SOLN Place 1 drop into both eyes 1 day or 1 dose. (Patient not taking: Reported on 05/29/2020)  2.5 mL 5  . ondansetron (ZOFRAN-ODT) 4 MG disintegrating tablet Take 1 tablet (4 mg total) by mouth every 8 (eight) hours as needed for nausea or vomiting. (Patient not taking: Reported on 05/29/2020) 20 tablet 0  . ondansetron (ZOFRAN-ODT) 8 MG disintegrating tablet DISSOLVE 1 TABLET IN MOUTH EVERY 8 HOURS AS NEEDED. 18 tablet 4  . pantoprazole (PROTONIX) 40 MG tablet TAKE 1 TABLET BY MOUTH TWICE DAILY BEFORE MEALS. 60 tablet 3  . phenazopyridine (PYRIDIUM) 200 MG tablet Take 200 mg by mouth daily as needed.     . promethazine (PHENERGAN) 25 MG tablet TAKE (1) TABLET BY MOUTH TWICE A DAY AS NEEDED. (Patient taking differently: Take 25 mg by mouth. TAKE (1) TABLET BY MOUTH TWICE A DAY AS NEEDED.) 14 tablet 0  . topiramate (TOPAMAX) 100 MG tablet Take 1 tablet (100 mg total) by mouth 2 (two) times daily. 60 tablet 2  . traZODone (DESYREL) 150 MG tablet Take 1 tablet (150 mg total) by mouth at bedtime. 30 tablet 2   No current facility-administered medications for this visit.   Allergies:  Gabapentin, Metoclopramide hcl, Other, Tramadol, Ativan [lorazepam], Latex, Lyrica [pregabalin], Trazodone and nefazodone, Zofran [ondansetron], Iron, Sulfa antibiotics, Butrans [buprenorphine], Nucynta [tapentadol], Propofol, Tape, and Toradol [ketorolac tromethamine]   Social History: The patient  reports that she has quit smoking. Her smoking use included e-cigarettes. She started smoking about 2 years ago. She has a 1.50 pack-year smoking history. She has never used smokeless tobacco. She reports current alcohol use. She reports previous drug use. Drug: Marijuana.   Family History: The patient's family history includes Anxiety disorder in her brother and mother; Bipolar disorder in her brother; Breast cancer in her maternal grandmother, mother, and paternal grandmother; Cancer in her maternal grandmother and mother; Coronary artery disease in her father and paternal grandfather; Depression in her mother; Diabetes  in her father; Healthy in her daughter and son; Hemochromatosis in her maternal grandmother and mother; Hypertension in her father; Migraines in her maternal grandmother and paternal grandmother.   ROS:  Please see the history of present illness. Otherwise, complete review of systems is positive for {NONE DEFAULTED:18576::"none"}.  All other systems are reviewed and negative.   Physical Exam: VS:  There were no vitals taken for this visit., BMI There is no height or weight on file to calculate BMI.  Wt Readings from Last 3 Encounters:  05/29/20 188 lb (85.3 kg)  03/06/20 192 lb (87.1 kg)  02/20/20 192 lb (87.1 kg)    General: Patient appears comfortable at rest. HEENT: Conjunctiva and lids normal, oropharynx clear with moist mucosa. Neck: Supple, no elevated JVP or carotid bruits, no thyromegaly. Lungs: Clear to auscultation, nonlabored breathing at rest. Cardiac: Regular rate and rhythm, no S3 or significant systolic murmur, no pericardial rub. Abdomen: Soft, nontender, no hepatomegaly, bowel sounds present, no guarding or rebound. Extremities: No pitting edema, distal pulses 2+. Skin: Warm and dry. Musculoskeletal: No kyphosis. Neuropsychiatric: Alert and oriented x3, affect grossly appropriate.  ECG:  {EKG/Telemetry Strips Reviewed:[id number]}  Recent Labwork: 08/17/2019: B Natriuretic Peptide 6.0 08/25/2019: TSH 2.616 09/19/2019: Magnesium 2.2 01/03/2020: ALT 23; AST 21 05/31/2020: BUN 13; Creatinine, Ser 0.66; Platelets 297; Potassium 3.9; Sodium 139 06/03/2020: Hemoglobin 13.9     Component Value Date/Time   CHOL 95 03/10/2012 0642   TRIG 50 03/10/2012 0642   HDL 38 (L) 03/10/2012 0642   CHOLHDL 2.5 03/10/2012 0642   VLDL 10 03/10/2012 0642   LDLCALC 47 03/10/2012 0642    Other Studies Reviewed Today:  Cardiac event monitor 06/26/2019 Study Highlights  NSR Average HR 89 Patient triggered events correlate only with NSR no arrhythmia  Rare PAC/PVCls < 1% total beats  not symptomatic    Echo 08/29/2019  1. Left ventricular ejection fraction, by visual estimation, is 50 to 55%. The left ventricle has normal function. Normal left ventricular size. There is no left ventricular hypertrophy. 2. Global right ventricle has normal systolic function.The right ventricular size is normal. No increase in right ventricular wall thickness. 3. Left atrial size was normal. 4. Right atrial size was normal. 5. The mitral valve is grossly normal with mild thickening. No evidence of mitral valve regurgitation. 6. The tricuspid valve is grossly normal. Tricuspid valve regurgitation is trivial. 7. The aortic valve is tricuspid Aortic valve regurgitation was not visualized by color flow Doppler. 8. The pulmonic valve was not well visualized. Pulmonic valve regurgitation was not assessed by color flow Doppler. 9. TR signal is inadequate for assessing pulmonary artery systolic pressure. 10. The inferior vena cava is normal in size with greater than 50% respiratory variability, suggesting right atrial pressure of 3 mmHg. 11. Patient reported to have history of ostium secundum ASD, but this is not clearly evident by the present study. In comparison to the previous echocardiogram(s): Unable to compare with prior study.   Assessment and Plan:  1. Palpitations   2. Chest pain, atypical    1. Palpitations ***  2. Chest pain, atypical ***  Medication Adjustments/Labs and Tests Ordered: Current medicines are reviewed at length with the patient today.  Concerns regarding medicines are outlined above.   Disposition: Follow-up with ***  Signed, Levell July, NP 06/16/2020 6:07 AM    [facility] [place], [place], [place] [address] Phone: [phone]; Fax: [phone]
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
## 2020-06-17 ENCOUNTER — Encounter (HOSPITAL_COMMUNITY): Payer: Self-pay | Admitting: Emergency Medicine

## 2020-06-17 ENCOUNTER — Other Ambulatory Visit: Payer: Self-pay

## 2020-06-17 ENCOUNTER — Telehealth: Payer: Self-pay | Admitting: Cardiovascular Disease

## 2020-06-17 ENCOUNTER — Emergency Department (HOSPITAL_COMMUNITY)
Admission: EM | Admit: 2020-06-17 | Discharge: 2020-06-19 | Disposition: A | Discharge status: Home / Self Care | Payer: Medicaid Other | Attending: Emergency Medicine | Admitting: Emergency Medicine

## 2020-06-17 ENCOUNTER — Emergency Department (HOSPITAL_COMMUNITY): Payer: Medicaid Other

## 2020-06-17 DIAGNOSIS — Z03818 Encounter for observation for suspected exposure to other biological agents ruled out: Secondary | ICD-10-CM | POA: Diagnosis not present

## 2020-06-17 DIAGNOSIS — R45851 Suicidal ideations: Secondary | ICD-10-CM | POA: Diagnosis not present

## 2020-06-17 DIAGNOSIS — R9431 Abnormal electrocardiogram [ECG] [EKG]: Secondary | ICD-10-CM | POA: Diagnosis not present

## 2020-06-17 DIAGNOSIS — Z91040 Latex allergy status: Secondary | ICD-10-CM | POA: Insufficient documentation

## 2020-06-17 DIAGNOSIS — I10 Essential (primary) hypertension: Secondary | ICD-10-CM | POA: Diagnosis not present

## 2020-06-17 DIAGNOSIS — Z20822 Contact with and (suspected) exposure to covid-19: Secondary | ICD-10-CM | POA: Insufficient documentation

## 2020-06-17 DIAGNOSIS — Z79899 Other long term (current) drug therapy: Secondary | ICD-10-CM | POA: Insufficient documentation

## 2020-06-17 DIAGNOSIS — F69 Unspecified disorder of adult personality and behavior: Secondary | ICD-10-CM | POA: Diagnosis not present

## 2020-06-17 DIAGNOSIS — F419 Anxiety disorder, unspecified: Secondary | ICD-10-CM | POA: Diagnosis not present

## 2020-06-17 DIAGNOSIS — R197 Diarrhea, unspecified: Secondary | ICD-10-CM | POA: Insufficient documentation

## 2020-06-17 DIAGNOSIS — F332 Major depressive disorder, recurrent severe without psychotic features: Secondary | ICD-10-CM | POA: Diagnosis not present

## 2020-06-17 DIAGNOSIS — F410 Panic disorder [episodic paroxysmal anxiety] without agoraphobia: Secondary | ICD-10-CM | POA: Diagnosis present

## 2020-06-17 DIAGNOSIS — Z87891 Personal history of nicotine dependence: Secondary | ICD-10-CM | POA: Insufficient documentation

## 2020-06-17 DIAGNOSIS — R300 Dysuria: Secondary | ICD-10-CM | POA: Insufficient documentation

## 2020-06-17 DIAGNOSIS — R0602 Shortness of breath: Secondary | ICD-10-CM | POA: Diagnosis not present

## 2020-06-17 LAB — CBC WITH DIFFERENTIAL/PLATELET
Abs Immature Granulocytes: 0.02 10*3/uL (ref 0.00–0.07)
Basophils Absolute: 0 10*3/uL (ref 0.0–0.1)
Basophils Relative: 0 %
Eosinophils Absolute: 0 10*3/uL (ref 0.0–0.5)
Eosinophils Relative: 0 %
HCT: 42 % (ref 36.0–46.0)
Hemoglobin: 14.5 g/dL (ref 12.0–15.0)
Immature Granulocytes: 0 %
Lymphocytes Relative: 24 %
Lymphs Abs: 1.6 10*3/uL (ref 0.7–4.0)
MCH: 32 pg (ref 26.0–34.0)
MCHC: 34.5 g/dL (ref 30.0–36.0)
MCV: 92.7 fL (ref 80.0–100.0)
Monocytes Absolute: 0.4 10*3/uL (ref 0.1–1.0)
Monocytes Relative: 6 %
Neutro Abs: 4.6 10*3/uL (ref 1.7–7.7)
Neutrophils Relative %: 70 %
Platelets: 260 10*3/uL (ref 150–400)
RBC: 4.53 MIL/uL (ref 3.87–5.11)
RDW: 12.1 % (ref 11.5–15.5)
WBC: 6.6 10*3/uL (ref 4.0–10.5)
nRBC: 0 % (ref 0.0–0.2)

## 2020-06-17 LAB — RAPID URINE DRUG SCREEN, HOSP PERFORMED
Amphetamines: NOT DETECTED
Barbiturates: NOT DETECTED
Benzodiazepines: POSITIVE — AB
Cocaine: NOT DETECTED
Opiates: NOT DETECTED
Tetrahydrocannabinol: POSITIVE — AB

## 2020-06-17 LAB — URINALYSIS, ROUTINE W REFLEX MICROSCOPIC
Bacteria, UA: NONE SEEN
Bilirubin Urine: NEGATIVE
Glucose, UA: NEGATIVE mg/dL
Hgb urine dipstick: NEGATIVE
Ketones, ur: 80 mg/dL — AB
Nitrite: NEGATIVE
Protein, ur: 30 mg/dL — AB
Specific Gravity, Urine: 1.027 (ref 1.005–1.030)
pH: 5 (ref 5.0–8.0)

## 2020-06-17 LAB — COMPREHENSIVE METABOLIC PANEL
ALT: 27 U/L (ref 0–44)
AST: 17 U/L (ref 15–41)
Albumin: 4.6 g/dL (ref 3.5–5.0)
Alkaline Phosphatase: 69 U/L (ref 38–126)
Anion gap: 12 (ref 5–15)
BUN: 15 mg/dL (ref 6–20)
CO2: 19 mmol/L — ABNORMAL LOW (ref 22–32)
Calcium: 9.5 mg/dL (ref 8.9–10.3)
Chloride: 109 mmol/L (ref 98–111)
Creatinine, Ser: 0.69 mg/dL (ref 0.44–1.00)
GFR calc Af Amer: >60 mL/min (ref >60)
GFR calc non Af Amer: >60 mL/min (ref >60)
Glucose, Bld: 114 mg/dL — ABNORMAL HIGH (ref 70–99)
Potassium: 3.1 mmol/L — ABNORMAL LOW (ref 3.5–5.1)
Sodium: 140 mmol/L (ref 135–145)
Total Bilirubin: 0.8 mg/dL (ref 0.3–1.2)
Total Protein: 7.7 g/dL (ref 6.5–8.1)

## 2020-06-17 LAB — C DIFFICILE QUICK SCREEN W PCR REFLEX
C Diff antigen: NEGATIVE
C Diff interpretation: NOT DETECTED
C Diff toxin: NEGATIVE

## 2020-06-17 LAB — ETHANOL: Alcohol, Ethyl (B): <10 mg/dL (ref <10)

## 2020-06-17 LAB — PREGNANCY, URINE: Preg Test, Ur: NEGATIVE

## 2020-06-17 LAB — SARS CORONAVIRUS 2 BY RT PCR (HOSPITAL ORDER, PERFORMED IN ~~LOC~~ HOSPITAL LAB): SARS Coronavirus 2: NEGATIVE

## 2020-06-17 MED ORDER — TRAZODONE HCL 50 MG PO TABS
150.0000 mg | ORAL_TABLET | Freq: Every day | ORAL | Status: DC
  Administered 2020-06-18 (×2): 150 mg via ORAL
  Filled 2020-06-17: qty 3

## 2020-06-17 MED ORDER — TRAZODONE HCL 50 MG PO TABS
150.0000 mg | ORAL_TABLET | Freq: Every day | ORAL | Status: DC
  Filled 2020-06-17: qty 3

## 2020-06-17 MED ORDER — DIPHENOXYLATE-ATROPINE 2.5-0.025 MG PO TABS
1.0000 | ORAL_TABLET | Freq: Once | ORAL | Status: AC
  Administered 2020-06-17: 1 via ORAL
  Filled 2020-06-17: qty 1

## 2020-06-17 MED ORDER — HYDROMORPHONE HCL 2 MG PO TABS: 4.0000 mg | ORAL_TABLET | ORAL | Status: DC | PRN

## 2020-06-17 MED ORDER — OXYCODONE-ACETAMINOPHEN 5-325 MG PO TABS
2.0000 | ORAL_TABLET | Freq: Once | ORAL | Status: AC
  Administered 2020-06-17: 2 via ORAL
  Filled 2020-06-17: qty 2

## 2020-06-17 MED ORDER — SODIUM CHLORIDE 0.9 % IV BOLUS: 1000.0000 mL | Freq: Once | INTRAVENOUS | Status: DC

## 2020-06-17 MED ORDER — POTASSIUM CHLORIDE CRYS ER 20 MEQ PO TBCR
20.0000 meq | EXTENDED_RELEASE_TABLET | Freq: Once | ORAL | Status: AC
  Administered 2020-06-17: 20 meq via ORAL
  Filled 2020-06-17: qty 1

## 2020-06-17 MED ORDER — DULOXETINE HCL 30 MG PO CPEP
60.0000 mg | ORAL_CAPSULE | Freq: Two times a day (BID) | ORAL | Status: DC
  Administered 2020-06-17 – 2020-06-19 (×4): 60 mg via ORAL
  Filled 2020-06-17 (×4): qty 2

## 2020-06-17 MED ORDER — CLONAZEPAM 0.5 MG PO TABS
0.5000 mg | ORAL_TABLET | Freq: Two times a day (BID) | ORAL | Status: DC | PRN
  Administered 2020-06-17 – 2020-06-19 (×4): 0.5 mg via ORAL
  Filled 2020-06-17 (×4): qty 1
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
## 2020-06-17 NOTE — Telephone Encounter (Signed)
I called patient, she was already on her way to the ED via EMS.

## 2020-06-17 NOTE — ED Notes (Signed)
PT numbers: Shanon [phone] Ronalee Belts (step [phone] Butch Penny (step mom) [phone] Cole(sons father) [phone]

## 2020-06-17 NOTE — Telephone Encounter (Signed)
I will FYI Dr.Nishan
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
## 2020-06-17 NOTE — ED Provider Notes (Signed)
[place] Provider Note   CSN: [id number] Arrival date & time: 06/17/20  1408     History Chief Complaint  Patient presents with  . Anxiety    Penny [name] is a 34 y.o. female.  HPI 34 year old female with an extensive medical history including migraines, fibromyalgia, depression, seizures, lupus, anxiety, history of opioid abuse, interstitial cystitis presents to the ER with multiple complaints.  Patient states that she has been having profuse watery diarrhea for the last 5 days and has not been eating or drinking well.  She denies any abdominal pain fevers or chills.  No recent antibiotic use.  Denies any blood in her diarrhea.  She cannot recall a inciting food that she ate.  She states that she is now having some dysuria because of all the diarrhea that she has been having.  She states that she is also prone to UTIs given that she has interstitial cystitis.  She is also states that over the last few months she has been having horrible anxiety attacks, stating that they are not relieved by her normal medication.  She has also been extremely depressed and has not been getting out of the bed for the last 5 days.  She states that she does not feel safe at home because she is afraid of herself.  She denies any SI or HI, states that she has 2 young children that she is very determined to stick around for.  She however states that she has been neglecting her home duties, and called her father to pick her up in order to make sure that her child was safe.  Her father stated that he would take the child, however that she needed to get help, patient refers that he "put her up against the wall" and stated that she had to get help or they would not let her see the child.  Patient also reports that she has been having difficulties with her fianc and his son.  She denies any auditory visual hallucinations.  Denies any alcohol or drug use.  She also states that she has been having  shortness of breath, numbness or tingling in her hands over the last 5 days.  She attributes this to her panic attacks, and per chart review she has had these complaints before.  She states that she follows closely with a psychiatrist, and she was recently told that she needs to start therapy, however this "takes some time".  She states that she has not been taking her home medications today which is exacerbating her symptoms.  She is wanting for something to stop her diarrhea, and is asking for something to drink.    Past Medical History:  Diagnosis Date  . ADD (attention deficit disorder)   . Anemia 2011   2o to GASTRIC BYPASS  . Anginal pain ([place])   . Anxiety   . Blood transfusion without reported diagnosis   . Chronic daily headache   . Depression   . Depression   . Dysmenorrhea 09/18/2013  . Dysrhythmia   . Elevated liver enzymes JUL 2011ALK PHOS 111-127 AST  143-267 ALT  213-321T BILI 0.6  ALB  3.7-4.06 Jun 2011 ALK PHOS 118 AST 24 ALT 42 T BILI 0.4 ALB 3.9  . Encounter for drug rehabilitation    at behavioral health for opioid addiction about 4 years ago  . Family history of adverse reaction to anesthesia    'dad had to be kept on pump for  breathing for morphine'  . Fatty liver   . Fibroid 01/18/2017  . Fibromyalgia   . Gastric bypass status for obesity   . Gastritis JULY 2011  . Heart murmur   . 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 History of Holter monitoring   . Hx of opioid abuse ([place])    for about 4 years, about 4 years ago  . Interstitial cystitis   . Iron deficiency anemia 07/23/2010  . Irritable bowel syndrome 2012 DIARRHEA   JUN 2012 TTG IgA 14.9  . IUD FEB 2010  . Lupus ([place])   . Menorrhagia 07/18/2013  . Migraines   . Obesity (BMI 30-39.9) 2011 228 LBS BMI 36.8  . Ovarian cyst   . Patient desires pregnancy 09/18/2013  . Polyneuropathy   . PONV (postoperative nausea and vomiting) seizure post-operatively   seizure following ankle  surgery  . Potassium (K) deficiency   . Pregnant 12/25/2013  . Psychiatric pseudoseizure   . RLQ abdominal pain 07/18/2013  . Sciatica of left side 11/14/2014  . 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 Stress 09/18/2013    Patient Active Problem List   Diagnosis Date Noted  . Dehydration   . Intractable abdominal pain 09/19/2019  . Intractable nausea and vomiting 09/19/2019  . Gastroesophageal reflux disease without esophagitis   . Cannabis abuse 08/30/2019  . Dyspnea   . Severe anxiety 08/21/2019  . Hematemesis with nausea 04/23/2019  . Absolute anemia 04/23/2019  . Urge incontinence 10/26/2018  . S/P kyphoplasty 08/02/2018  . Herpes genitalis in women 12/19/2017  . Cannabis use disorder, moderate, dependence ([place]) 07/07/2017  . Abnormal urine odor 03/30/2017  . Urinary tract infection with hematuria 03/30/2017  . Screening for genitourinary condition 03/30/2017  . Pain 03/30/2017  . Burning with urination 03/30/2017  . Family history of breast cancer in mother,uncertain BR CA status 02/28/2017  . Fibroid 01/18/2017  . Chronic pain syndrome 12/05/2016  . Folate deficiency 12/04/2016  . Nausea vomiting and diarrhea 12/04/2016  . Severe anemia 12/04/2016  . Symptomatic anemia 12/03/2016  . Anastomotic ulcer 04/22/2016  . Pain medication agreement signed 04/22/2016  . Complaints of leg weakness   . Paresthesia   . Left-sided weakness   . Uncontrolled pain 01/24/2016  . Malnutrition of moderate degree 01/24/2016  . Neuropathy 01/23/2016  . Inability to walk 01/23/2016  . Paresthesias   . Bilateral leg numbness 11/26/2015  . 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 Post partum depression 05/09/2015  . Dysphagia   . Hematemesis 04/07/2015  . Intractable vomiting with nausea 04/07/2015  . Elevated liver enzymes   . Epigastric pain   . Pseudoseizure ([place]) 02/22/2015  . Seizure-like activity ([place]) 02/22/2015  .  Fibromyalgia 02/18/2015  . Vulvar fissure 02/18/2015  . Clinical depression 02/01/2015  . Current smoker 01/29/2015  . Current tobacco use 01/29/2015  . 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 Leg weakness, bilateral 01/02/2015  . Gait disorder 01/02/2015  . Other symptoms and signs involving the musculoskeletal system 01/02/2015  . Abnormal gait 01/02/2015  . Cervicalgia 12/18/2014  . Sciatica of left side 11/14/2014  . Neuralgia neuritis, sciatic nerve 11/14/2014  . 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 Encounter for sterilization 09/09/2014  . Abdominal pain, acute, right lower quadrant 08/22/2014  . Headache, migraine 08/19/2014  . Seizure ([place]) 08/18/2014  . Encephalopathy 08/13/2014  . Headache 08/13/2014  . Altered mental status 08/13/2014  . Right leg numbness 07/31/2014  .  Disturbance of skin sensation 07/31/2014  . Hypertension in pregnancy, transient 07/08/2014  . Previous gastric bypass affecting pregnancy, antepartum 05/22/2014  . History of bariatric surgery 05/22/2014  . 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 Antepartum mental disorder in pregnancy 05/09/2014  . Rapid palpitations 02/18/2014  . H/O maternal third degree perineal laceration, currently pregnant 02/18/2014  . High-risk pregnancy 02/18/2014  . Supervision of pregnancy with other poor reproductive or obstetric history, unspecified trimester 02/18/2014  . Restless legs 01/16/2014  . Restless leg 01/16/2014  . Chronic interstitial cystitis 10/23/2013  . Menorrhagia 07/18/2013  . Excessive and frequent menstruation 07/18/2013  . h/o Opiate addiction 03/11/2012    Class: Acute  . History of migraine headaches 03/10/2012    Class: Acute  . Depression with anxiety 03/10/2012    Class: Chronic  . 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 Attention deficit hyperactivity  disorder, 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    Class: Chronic  . Panic disorder without agoraphobia 03/10/2012  . H/O disease 03/10/2012  . Dysthymia 03/10/2012  . 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 Other specified behavioral and emotional disorders with onset usually occurring in childhood and adolescence 03/10/2012  . Pelvic congestion syndrome 10/13/2011  . Coitalgia 10/13/2011  . Chronic migraine without aura 10/13/2011  . Unspecified dyspareunia 10/13/2011  . Other specified conditions associated with female genital organs and menstrual cycle 10/13/2011  . IBS (irritable bowel syndrome) 08/25/2011  . Diarrhea 05/27/2011  . OBESITY, UNSPECIFIED 09/17/2010  . Transaminitis 09/17/2010  . IDA (iron deficiency anemia) 07/23/2010    Past Surgical History:  Procedure Laterality Date  . BIOPSY  04/10/2015   Procedure: BIOPSY;  Surgeon: Daneil Dolin, MD;  Location: [facility];  Service: Endoscopy;;  . cath self every nite     for sodium bicarb injection (discontinued 2013)  . CHOLECYSTECTOMY  2005   biliary dyskinesia  . COLONOSCOPY  JUN 2012 ABD PN/DIARRHEA WITH PROPOFOL   NL COLON  . DILATION AND CURETTAGE OF UTERUS    . DILITATION & CURRETTAGE/HYSTROSCOPY WITH NOVASURE ABLATION N/A 03/24/2017   Procedure: DILATATION & CURETTAGE/HYSTEROSCOPY WITH NOVASURE ENDOMETRIAL ABLATION;  Surgeon: Jonnie Kind, MD;  Location: [facility];  Service: Gynecology;  Laterality: N/A;  . ESOPHAGEAL DILATION N/A 04/10/2015   Procedure: ESOPHAGEAL DILATION WITH 54FR MALONEY DILATOR;  Surgeon: Daneil Dolin, MD;  Location: [facility];  Service: Endoscopy;  Laterality: N/A;  . ESOPHAGOGASTRODUODENOSCOPY    . ESOPHAGOGASTRODUODENOSCOPY (EGD) WITH PROPOFOL N/A 04/10/2015   Procedure: ESOPHAGOGASTRODUODENOSCOPY (EGD) WITH PROPOFOL;  Surgeon: Daneil Dolin, MD;  Location: [facility];  Service: Endoscopy;  Laterality: N/A;  . ESOPHAGOGASTRODUODENOSCOPY (EGD) WITH PROPOFOL N/A 12/06/2016    Procedure: ESOPHAGOGASTRODUODENOSCOPY (EGD) WITH PROPOFOL;  Surgeon: Danie Binder, MD;  [facility];  Service: Endoscopy;  Laterality: N/A;  . ESOPHAGOGASTRODUODENOSCOPY (EGD) WITH PROPOFOL N/A 04/27/2019   Procedure: ESOPHAGOGASTRODUODENOSCOPY (EGD) WITH PROPOFOL;  Surgeon: Rogene Houston, MD;  [facility];  Service: Endoscopy;  Laterality: N/A;  730  . ESOPHAGOGASTRODUODENOSCOPY (EGD) WITH PROPOFOL N/A 08/31/2019   Procedure: ESOPHAGOGASTRODUODENOSCOPY (EGD) WITH PROPOFOL;  Surgeon: Rogene Houston, MD;  [facility];  Service: Endoscopy;  Laterality: N/A;  . GAB  2007   in [place]-POUCH 5 CM  . GASTRIC BYPASS  06/2006  . HYSTEROSCOPY WITH D & C N/A 09/12/2014   Procedure: DILATATION  AND CURETTAGE /HYSTEROSCOPY;  Surgeon: Jonnie Kind, MD;  Location: [facility];  Service: Gynecology;  Laterality: N/A;  . KYPHOPLASTY N/A 08/02/2018   Procedure: KYPHOPLASTY T12;  Surgeon: Melina Schools, MD;  Location: [facility];  Service: Orthopedics;  Laterality: N/A;  60 mins  . LAPAROSCOPIC TUBAL LIGATION Bilateral 03/24/2017   Procedure: LAPAROSCOPIC TUBAL LIGATION ([place] Rings);  Surgeon: Jonnie Kind, MD;  Location: [facility];  Service: Gynecology;  Laterality: Bilateral;  . REPAIR VAGINAL CUFF N/A 07/30/2014   Procedure: REPAIR VAGINAL CUFF;  Surgeon: Mora Bellman, MD;  Location: [facility];  Service: Gynecology;  Laterality: N/A;  . SAVORY DILATION  06/20/2012   Dr. Barnie Alderman gastritis/Ulcer in the mid jejunum. Empiric dilation.   . SURAL NERVE BX Left 02/25/2016   Procedure: LEFT SURAL NERVE BIOPSY;  Surgeon: Jovita Gamma, MD;  Location: [facility];  Service: Neurosurgery;  Laterality: Left;  Left sural nerve biopsy  . TONSILLECTOMY    . TONSILLECTOMY AND ADENOIDECTOMY    . UPPER GASTROINTESTINAL ENDOSCOPY  JULY 2011 NAUSEA-D125,V6, PH 25   Bx; GASTRITIS, POUCH-5 CM LONG  . WISDOM TOOTH EXTRACTION       OB History    Gravida  3   Para  2   Term  1    Preterm  1   AB  1   Living  2     SAB  1   TAB      Ectopic      Multiple      Live Births  2           Family History  Problem Relation Age of Onset  . Hemochromatosis Maternal Grandmother   . Migraines Maternal Grandmother   . Cancer Maternal Grandmother   . Breast cancer Maternal Grandmother   . Hypertension Father   . Diabetes Father   . Coronary artery disease Father   . Migraines Paternal Grandmother   . Breast cancer Paternal Grandmother   . Cancer Mother        breast  . Hemochromatosis Mother   . Breast cancer Mother   . Depression Mother   . Anxiety disorder Mother   . Coronary artery disease Paternal Grandfather   . Anxiety disorder Brother   . Bipolar disorder Brother   . Healthy Daughter   . Healthy Son     Social History   Tobacco Use  . Smoking status: Former Smoker    Packs/day: 0.25    Years: 6.00    Pack years: 1.50    Types: E-cigarettes    Start date: 05/2018  . Smokeless tobacco: Never Used  Vaping Use  . Vaping Use: Former  Substance Use Topics  . Alcohol use: Yes    Alcohol/week: 0.0 standard drinks    Comment: 1-2 beers a couple times a month   . Drug use: Not Currently    Types: Marijuana    Comment: Previously opioid addiction went through rehab    Home Medications Prior to Admission medications   Medication Sig Start Date End Date Taking? Authorizing Provider  albuterol (PROVENTIL) (2.5 MG/3ML) 0.083% nebulizer solution Take 3 mLs (2.5 mg total) by nebulization every 4 (four) hours as needed for wheezing. 10/08/19   Kathyrn Drown, MD  amphetamine-dextroamphetamine (ADDERALL XR) 30 MG 24 hr capsule Take 1 capsule (30 mg total) by mouth every morning. Patient not taking: Reported on 05/29/2020 04/15/20   Cloria Spring, MD  amphetamine-dextroamphetamine (ADDERALL XR) 30 MG 24 hr capsule Take  1 capsule (30 mg total) by mouth daily. 06/16/20   Cloria Spring, MD  budesonide-formoterol [facility]) 160-4.5 MCG/ACT inhaler  Inhale 2 puffs into the lungs 2 (two) times daily. 05/23/20   Valentina Shaggy, MD  busPIRone (BUSPAR) 5 MG tablet Take 1 tablet (5 mg total) by mouth 3 (three) times daily. 06/16/20   Cloria Spring, MD  cetirizine (ZYRTEC) 10 MG tablet Take 1 tablet (10 mg total) by mouth daily. 05/23/20   Valentina Shaggy, MD  clonazePAM (KLONOPIN) 0.5 MG tablet Take 1 tablet (0.5 mg total) by mouth 2 (two) times daily as needed for anxiety. 06/16/20 06/16/21  Cloria Spring, MD  DULoxetine (CYMBALTA) 60 MG capsule Take 1 capsule (60 mg total) by mouth 2 (two) times daily. 06/16/20   Cloria Spring, MD  Erenumab-aooe (AIMOVIG) 70 MG/ML SOAJ Inject 70 mg into the skin every 28 (twenty-eight) days. 02/21/20   Patel, Donika K, DO  fluticasone (FLONASE) 50 MCG/ACT nasal spray Place 2 sprays into both nostrils daily. Patient not taking: Reported on 05/29/2020 05/23/20   Valentina Shaggy, MD  HYDROmorphone (DILAUDID) 2 MG tablet Take 1-2 tablets po q 4 hrs prn pain no greater than 9 tablets/day Patient taking differently: 4 mg every 4 (four) hours as needed for severe pain. Take 1-2 tablets po q 4 hrs prn pain no greater than 9 tablets/day 10/15/19   Kathyrn Drown, MD  levalbuterol [facility] HFA) 45 MCG/ACT inhaler Inhale 2 puffs into the lungs every 6 (six) hours as needed for wheezing. 10/25/19   Valentina Shaggy, MD  montelukast (SINGULAIR) 10 MG tablet Take 1 tablet (10 mg total) by mouth at bedtime. 02/15/20   Valentina Shaggy, MD  Olopatadine HCl (PATADAY) 0.2 % SOLN Place 1 drop into both eyes 1 day or 1 dose. Patient not taking: Reported on 05/29/2020 05/29/20   Valentina Shaggy, MD  ondansetron (ZOFRAN-ODT) 4 MG disintegrating tablet Take 1 tablet (4 mg total) by mouth every 8 (eight) hours as needed for nausea or vomiting. Patient not taking: Reported on 05/29/2020 09/20/19   Barton Dubois, MD  ondansetron (ZOFRAN-ODT) 8 MG disintegrating tablet DISSOLVE 1 TABLET IN MOUTH EVERY 8 HOURS AS  NEEDED. 05/13/20   Kathyrn Drown, MD  pantoprazole (PROTONIX) 40 MG tablet TAKE 1 TABLET BY MOUTH TWICE DAILY BEFORE MEALS. 05/30/20   Kathyrn Drown, MD  phenazopyridine (PYRIDIUM) 200 MG tablet Take 200 mg by mouth daily as needed.  02/23/10   [provider]  promethazine (PHENERGAN) 25 MG tablet TAKE (1) TABLET BY MOUTH TWICE A DAY AS NEEDED. Patient taking differently: Take 25 mg by mouth. TAKE (1) TABLET BY MOUTH TWICE A DAY AS NEEDED. 04/14/20   Kathyrn Drown, MD  topiramate (TOPAMAX) 100 MG tablet Take 1 tablet (100 mg total) by mouth 2 (two) times daily. 06/16/20 06/16/21  Cloria Spring, MD  traZODone (DESYREL) 150 MG tablet Take 1 tablet (150 mg total) by mouth at bedtime. 06/16/20   Cloria Spring, MD    Allergies    Gabapentin, Metoclopramide hcl, Other, Tramadol, Ativan [lorazepam], Latex, Lyrica [pregabalin], Trazodone and nefazodone, Zofran [ondansetron], Iron, Sulfa antibiotics, Butrans [buprenorphine], Nucynta [tapentadol], Propofol, Tape, and Toradol [ketorolac tromethamine]  Review of Systems   Review of Systems  Constitutional: Negative for chills and fever.  HENT: Negative for ear pain and sore throat.   Eyes: Negative for pain and visual disturbance.  Respiratory: Negative for cough and shortness of breath.  Cardiovascular: Negative for chest pain and palpitations.  Gastrointestinal: Positive for diarrhea. Negative for abdominal pain and vomiting.  Genitourinary: Positive for dysuria. Negative for hematuria.  Musculoskeletal: Negative for arthralgias and back pain.  Skin: Negative for color change and rash.  Neurological: Negative for seizures, syncope and headaches.  Psychiatric/Behavioral: Positive for behavioral problems and sleep disturbance. Negative for confusion, dysphoric mood and suicidal ideas. The patient is nervous/anxious.   All other systems reviewed and are negative.   Physical Exam Updated Vital Signs BP 106/72 (BP Location: Right Arm)    Pulse (!) 125   Temp 98.5 F (36.9 C) (Oral)   Resp (!) 21   Wt 85.3 kg   SpO2 100%   BMI 30.35 kg/m   Physical Exam Vitals and nursing note reviewed.  Constitutional:      General: She is not in acute distress.    Appearance: Normal appearance. She is well-developed.     Comments: Anxious appearing, tearful  HENT:     Head: Normocephalic and atraumatic.  Eyes:     Conjunctiva/sclera: Conjunctivae normal.  Cardiovascular:     Rate and Rhythm: Normal rate and regular rhythm.     Heart sounds: No murmur heard.   Pulmonary:     Effort: Pulmonary effort is normal. No respiratory distress.     Breath sounds: Normal breath sounds.  Abdominal:     Palpations: Abdomen is soft.     Tenderness: There is no abdominal tenderness.  Musculoskeletal:     Cervical back: Neck supple.  Skin:    General: Skin is warm and dry.  Neurological:     General: No focal deficit present.     Mental Status: She is alert and oriented to person, place, and time.  Psychiatric:        Attention and Perception: Attention and perception normal.        Mood and Affect: Mood is anxious.        Speech: Speech normal.        Behavior: Behavior normal.     ED Results / Procedures / Treatments   Labs (all labs ordered are listed, but only abnormal results are displayed) Labs Reviewed  GASTROINTESTINAL PANEL BY PCR, STOOL (REPLACES STOOL CULTURE)  C DIFFICILE QUICK SCREEN W PCR REFLEX  CBC WITH DIFFERENTIAL/PLATELET  COMPREHENSIVE METABOLIC PANEL  ETHANOL  URINALYSIS, ROUTINE W REFLEX MICROSCOPIC    EKG None  Radiology DG Chest Portable 1 View  Result Date: 06/17/2020 CLINICAL DATA:  Shortness of breath EXAM: PORTABLE CHEST 1 VIEW COMPARISON:  May 31, 2020 FINDINGS: The heart size and mediastinal contours are within normal limits. Both lungs are clear. Cement fixation seen within the lower thoracic spine. IMPRESSION: No active disease. Electronically Signed   By: Prudencio Pair M.D.   On:  06/17/2020 18:01    Procedures Procedures (including critical care time)  Medications Ordered in ED Medications  sodium chloride 0.9 % bolus 1,000 mL (has no administration in time range)  clonazePAM (KLONOPIN) tablet 0.5 mg (has no administration in time range)  DULoxetine (CYMBALTA) DR capsule 60 mg (has no administration in time range)  diphenoxylate-atropine (LOMOTIL) 2.5-0.025 MG per tablet 1 tablet (has no administration in time range)    ED Course  I have reviewed the triage vital signs and the nursing notes.  Pertinent labs & imaging results that were available during my care of the patient were reviewed by me and considered in my medical decision making (see chart for details).  MDM Rules/Calculators/A&P                         34 year old female with multiple complaints, concerns for anxiety attacks and diarrhea On presentation, she is alert and oriented, nontoxic-appearing, anxious and tearful, but in no acute distress.  Her vitals are overall reassuring, initial pulse was 125 on arrival, however on my reevaluation in the ED room her pulse was consistently in the 60s.  Physical exam without any abdominal tenderness, no tachypnea.  Patient is requesting home medication refills, which I was happy to provide other than her home Dilaudid given the patient's history of opioid abuse.  I refilled her Klonopin, Cymbalta, and give the patient Lomotil.  I personally reviewed her lab work, CBC without leukocytosis, normal hemoglobin.  CMP with mild hypokalemia of 3.1, no EKG changes.  Patient received potassium supplement here in the ED.  Normal BUN/creatinine/AST/ALT.  UA, C. difficile and gastrointestinal PCR pending.  EKG normal sinus rhythm, chest x-ray without acute abnormalities.  Will fluid challenge.  I also consulted transition of care and TTS.  Care signed out to [facility].  If she is cleared by psych, I suspect that she will be stable for discharge.  Final Clinical  Impression(s) / ED Diagnoses Final diagnoses:  None    Rx / DC Orders ED Discharge Orders    None       Lyndel Safe 06/17/20 Ivery Quale, MD 06/17/20 [id number]

## 2020-06-17 NOTE — Telephone Encounter (Signed)
New message    Patient is having severe diarrhea, sob , dizziness, cant get out of bed, she is having palpitations and pressure in her chest.  hasnt been out of bed in 4 day , she went to the Er 05/31/20.   Offered patient virtual appointment on 7/19 with Dr Johnsie Cancel, patient refused wants to talk to someone asap .

## 2020-06-17 NOTE — ED Provider Notes (Signed)
   Patient signed out to me by Sharyn Lull, PA-C pending completion of work-up and TTS consult.  Patient has extensive medical history including anxiety and depression.  She is denying suicidal plan, but states that she feels that she is afraid to go home stating that she does not feel safe.  She is followed by psychiatry and had a video visit yesterday.  Medically, she was found to have mild hypokalemia and given potassium here.  Labs otherwise unremarkable.  She was felt to be medically cleared.  TTS consult has been completed and patient felt appropriate for inpatient placement at behavioral health.    Kem Parkinson, PA-C 06/17/20 [id number]    Veryl Speak, MD 06/18/20 1504

## 2020-06-17 NOTE — ED Notes (Signed)
Pt room stripped and belonging placed in bag ( fiance to pick up) Waiting psych scrubs to be given

## 2020-06-18 LAB — GASTROINTESTINAL PANEL BY PCR, STOOL (REPLACES STOOL CULTURE)

## 2020-06-18 MED ORDER — HYDROMORPHONE HCL 2 MG PO TABS
4.0000 mg | ORAL_TABLET | Freq: Four times a day (QID) | ORAL | Status: DC
  Administered 2020-06-19 (×2): 4 mg via ORAL
  Filled 2020-06-18 (×3): qty 2

## 2020-06-18 MED ORDER — LORATADINE 10 MG PO TABS
10.0000 mg | ORAL_TABLET | Freq: Every day | ORAL | Status: DC
  Administered 2020-06-18 – 2020-06-19 (×2): 10 mg via ORAL
  Filled 2020-06-18 (×2): qty 1

## 2020-06-18 MED ORDER — TRAZODONE HCL 50 MG PO TABS: 150.0000 mg | ORAL_TABLET | Freq: Every day | ORAL | Status: DC

## 2020-06-18 MED ORDER — TOPIRAMATE 25 MG PO TABS
100.0000 mg | ORAL_TABLET | Freq: Two times a day (BID) | ORAL | Status: DC
  Administered 2020-06-18 – 2020-06-19 (×3): 100 mg via ORAL
  Filled 2020-06-18 (×3): qty 4

## 2020-06-18 MED ORDER — ACETAMINOPHEN 500 MG PO TABS
1000.0000 mg | ORAL_TABLET | Freq: Once | ORAL | Status: DC
  Filled 2020-06-18: qty 2

## 2020-06-18 MED ORDER — BUPRENORPHINE HCL 75 MCG BU FILM: 1.0000 | ORAL_FILM | Freq: Two times a day (BID) | BUCCAL | Status: DC

## 2020-06-18 MED ORDER — PROMETHAZINE HCL 12.5 MG PO TABS
25.0000 mg | ORAL_TABLET | Freq: Three times a day (TID) | ORAL | Status: DC | PRN
  Administered 2020-06-18: 25 mg via ORAL
  Filled 2020-06-18: qty 2

## 2020-06-18 MED ORDER — HYDROMORPHONE HCL 2 MG PO TABS
2.0000 mg | ORAL_TABLET | Freq: Four times a day (QID) | ORAL | Status: DC | PRN
  Administered 2020-06-19: 2 mg via ORAL

## 2020-06-18 MED ORDER — HYDROMORPHONE HCL 2 MG PO TABS
4.0000 mg | ORAL_TABLET | Freq: Four times a day (QID) | ORAL | Status: DC | PRN
  Administered 2020-06-18 (×3): 4 mg via ORAL
  Filled 2020-06-18 (×3): qty 2

## 2020-06-18 MED ORDER — BUSPIRONE HCL 5 MG PO TABS
5.0000 mg | ORAL_TABLET | Freq: Three times a day (TID) | ORAL | Status: DC
  Administered 2020-06-18 – 2020-06-19 (×4): 5 mg via ORAL
  Filled 2020-06-18 (×4): qty 1

## 2020-06-18 MED ORDER — IBUPROFEN 400 MG PO TABS: 600.0000 mg | ORAL_TABLET | Freq: Four times a day (QID) | ORAL | Status: DC | PRN

## 2020-06-18 MED ORDER — MONTELUKAST SODIUM 10 MG PO TABS
10.0000 mg | ORAL_TABLET | Freq: Every day | ORAL | Status: DC
  Administered 2020-06-18: 10 mg via ORAL
  Filled 2020-06-18: qty 1

## 2020-06-18 NOTE — ED Provider Notes (Signed)
Discussed with pharmacy for her chronic home pain regimen. Recommends doing the dilaudid 4 mg po q6 scheduled. Add on 2 mg q6 prn breakthrough pain since we can't get her [place].   Sherwood Gambler, MD 06/18/20 [id number]

## 2020-06-18 NOTE — ED Notes (Signed)
Pt complaining of nausea.   

## 2020-06-18 NOTE — Progress Notes (Signed)
Pt has been accepted to [facility] tomorrow pending discharges. The morning AC will coordinate. Information called and given to Vivien Rota, Therapist, sports

## 2020-06-18 NOTE — ED Notes (Signed)
TTS done this shift

## 2020-06-18 NOTE — Progress Notes (Signed)
Reassessment: Patient seen. Chart reviewed. Patient is a 34 year old female with history of MDD, GAD, ADHD who presented to the hospital for worsening anxiety/depression after her father called 23. Per chart review she was seen by Dr. Harrington Challenger on 06/16/20 and reported severe anxiety and that family members were threatening custody of her children. She was started on Buspar at that time.  She reports severe panic attacks that have been "nonstop" over the past week. She appears severely depressed and tearful on assessment. She states she has been lying in bed continuously for the past five days, not eating, with shortness of breath, diarrhea, dizziness. Her son has been on vacation with his father, but she has a young daughter with developmental delays staying with her. She reports not feeling safe at home alone with her daughter. When asked to clarify, she states that she has been worried she will pass out and be unable to care for her daughter. She has been keeping the daughter in the bedroom with her for days. She called her father due to feeling unable to care for her daughter, and her father called 3. She denies SI/HI but states that she cannot function or care for her children at home due to severe anxiety/depression. She denies substance use or overtaking medications. She is prescribed Dilaudid through pain management for history of back injury. Klonopin 0.5 mg was decreased from TID to BID two months ago per the pain clinic's request.   Disposition: Patient continues to meet inpatient criteria. Patient has been accepted to [facility] pending discharges. ED RN updated.

## 2020-06-18 NOTE — Telephone Encounter (Signed)
Doesn't sound like a cardiac issue

## 2020-06-18 NOTE — ED Notes (Signed)
Pt crying and requesting a phone call. Pt stated she was having a panic attack and needed someone to talk to. Nurse talked with pt about children and worries of losing her children, nurse taught how to talk slow deep breaths to relieve anxiety and to loosen the tension within her body. Redirection helpful and phone given to pt.

## 2020-06-18 NOTE — BH Assessment (Signed)
Per Harriett Sine, NP the patient continues to meet criteria for inpatient psychiatric treatment. There are currently no beds available at [facility]. Disposition CSW referred the patient to the following facilities for potential placement:  Under Review:  [facility]  [facility]  [facility]    Disposition CSW/ TTS will continue to follow for possible placement.   Radonna Ricker, MSW, LCSW Clinical Social Worker [place]

## 2020-06-19 ENCOUNTER — Encounter (HOSPITAL_COMMUNITY): Payer: Self-pay | Admitting: Psychiatry

## 2020-06-19 ENCOUNTER — Other Ambulatory Visit: Payer: Self-pay

## 2020-06-19 ENCOUNTER — Inpatient Hospital Stay (HOSPITAL_COMMUNITY)
Admission: AD | Admit: 2020-06-19 | Discharge: 2020-06-23 | DRG: 885 | Disposition: A | Discharge status: Home / Self Care | Payer: Medicaid Other | Source: Intra-hospital | Attending: Psychiatry | Admitting: Psychiatry

## 2020-06-19 DIAGNOSIS — R45851 Suicidal ideations: Secondary | ICD-10-CM | POA: Diagnosis not present

## 2020-06-19 DIAGNOSIS — G8929 Other chronic pain: Secondary | ICD-10-CM | POA: Diagnosis not present

## 2020-06-19 DIAGNOSIS — Z87891 Personal history of nicotine dependence: Secondary | ICD-10-CM

## 2020-06-19 DIAGNOSIS — G4700 Insomnia, unspecified: Secondary | ICD-10-CM | POA: Diagnosis not present

## 2020-06-19 DIAGNOSIS — Z79899 Other long term (current) drug therapy: Secondary | ICD-10-CM

## 2020-06-19 DIAGNOSIS — J45909 Unspecified asthma, uncomplicated: Secondary | ICD-10-CM | POA: Diagnosis not present

## 2020-06-19 DIAGNOSIS — Z7951 Long term (current) use of inhaled steroids: Secondary | ICD-10-CM

## 2020-06-19 DIAGNOSIS — K219 Gastro-esophageal reflux disease without esophagitis: Secondary | ICD-10-CM | POA: Diagnosis present

## 2020-06-19 DIAGNOSIS — F332 Major depressive disorder, recurrent severe without psychotic features: Secondary | ICD-10-CM | POA: Diagnosis not present

## 2020-06-19 DIAGNOSIS — F329 Major depressive disorder, single episode, unspecified: Secondary | ICD-10-CM | POA: Diagnosis present

## 2020-06-19 DIAGNOSIS — F339 Major depressive disorder, recurrent, unspecified: Secondary | ICD-10-CM | POA: Diagnosis not present

## 2020-06-19 MED ORDER — TOPIRAMATE 100 MG PO TABS
100.0000 mg | ORAL_TABLET | Freq: Two times a day (BID) | ORAL | Status: DC
  Administered 2020-06-19 – 2020-06-23 (×8): 100 mg via ORAL
  Filled 2020-06-19 (×14): qty 1

## 2020-06-19 MED ORDER — CLONAZEPAM 0.5 MG PO TABS
0.5000 mg | ORAL_TABLET | Freq: Two times a day (BID) | ORAL | Status: DC | PRN
  Administered 2020-06-19 – 2020-06-23 (×8): 0.5 mg via ORAL
  Filled 2020-06-19 (×8): qty 1

## 2020-06-19 MED ORDER — FLUTICASONE PROPIONATE HFA 44 MCG/ACT IN AERO
2.0000 | INHALATION_SPRAY | Freq: Two times a day (BID) | RESPIRATORY_TRACT | Status: DC
  Administered 2020-06-20 – 2020-06-23 (×4): 2 via RESPIRATORY_TRACT
  Filled 2020-06-19 (×2): qty 10.6

## 2020-06-19 MED ORDER — ONDANSETRON 4 MG PO TBDP
8.0000 mg | ORAL_TABLET | Freq: Three times a day (TID) | ORAL | Status: DC | PRN
  Administered 2020-06-20 – 2020-06-22 (×3): 8 mg via ORAL
  Filled 2020-06-19 (×4): qty 2

## 2020-06-19 MED ORDER — TRAZODONE HCL 150 MG PO TABS
150.0000 mg | ORAL_TABLET | Freq: Every evening | ORAL | Status: DC | PRN
  Administered 2020-06-19 – 2020-06-20 (×2): 150 mg via ORAL
  Filled 2020-06-19 (×2): qty 1

## 2020-06-19 MED ORDER — MAGNESIUM HYDROXIDE 400 MG/5ML PO SUSP
30.0000 mL | Freq: Every day | ORAL | Status: DC | PRN
  Administered 2020-06-23: 30 mL via ORAL

## 2020-06-19 MED ORDER — FLUTICASONE PROPIONATE 50 MCG/ACT NA SUSP
2.0000 | Freq: Every day | NASAL | Status: DC
  Administered 2020-06-21 – 2020-06-22 (×2): 2 via NASAL
  Filled 2020-06-19 (×2): qty 16

## 2020-06-19 MED ORDER — ALBUTEROL SULFATE HFA 108 (90 BASE) MCG/ACT IN AERS: 1.0000 | INHALATION_SPRAY | Freq: Four times a day (QID) | RESPIRATORY_TRACT | Status: DC | PRN

## 2020-06-19 MED ORDER — BUSPIRONE HCL 5 MG PO TABS
5.0000 mg | ORAL_TABLET | Freq: Three times a day (TID) | ORAL | Status: DC
  Administered 2020-06-19 – 2020-06-23 (×12): 5 mg via ORAL
  Filled 2020-06-19 (×20): qty 1

## 2020-06-19 MED ORDER — OLOPATADINE HCL 0.1 % OP SOLN
1.0000 [drp] | Freq: Two times a day (BID) | OPHTHALMIC | Status: DC
  Administered 2020-06-20 – 2020-06-23 (×5): 1 [drp] via OPHTHALMIC
  Filled 2020-06-19: qty 5

## 2020-06-19 MED ORDER — DULOXETINE HCL 60 MG PO CPEP
60.0000 mg | ORAL_CAPSULE | Freq: Two times a day (BID) | ORAL | Status: DC
  Administered 2020-06-19 – 2020-06-23 (×8): 60 mg via ORAL
  Filled 2020-06-19 (×14): qty 1

## 2020-06-19 MED ORDER — IPRATROPIUM-ALBUTEROL 0.5-2.5 (3) MG/3ML IN SOLN: 3.0000 mL | Freq: Four times a day (QID) | RESPIRATORY_TRACT | Status: DC | PRN

## 2020-06-19 MED ORDER — PROMETHAZINE HCL 25 MG PO TABS
25.0000 mg | ORAL_TABLET | Freq: Four times a day (QID) | ORAL | Status: DC | PRN
  Administered 2020-06-19 – 2020-06-20 (×2): 25 mg via ORAL
  Filled 2020-06-19 (×2): qty 1

## 2020-06-19 MED ORDER — ACETAMINOPHEN 325 MG PO TABS: 650.0000 mg | ORAL_TABLET | Freq: Four times a day (QID) | ORAL | Status: DC | PRN

## 2020-06-19 MED ORDER — BUPRENORPHINE HCL 75 MCG BU FILM
1.0000 | ORAL_FILM | Freq: Two times a day (BID) | BUCCAL | Status: DC
  Administered 2020-06-19: 1 via ORAL
  Filled 2020-06-19 (×4): qty 1

## 2020-06-19 MED ORDER — PANTOPRAZOLE SODIUM 40 MG PO TBEC
40.0000 mg | DELAYED_RELEASE_TABLET | Freq: Two times a day (BID) | ORAL | Status: DC
  Administered 2020-06-19 – 2020-06-23 (×9): 40 mg via ORAL
  Filled 2020-06-19 (×14): qty 1

## 2020-06-19 MED ORDER — ALUM & MAG HYDROXIDE-SIMETH 200-200-20 MG/5ML PO SUSP: 30.0000 mL | ORAL | Status: DC | PRN

## 2020-06-19 NOTE — ED Provider Notes (Signed)
Emergency Medicine Observation Re-evaluation Note  Penny [name] is a 34 y.o. female, seen on rounds today.  Pt initially presented to the ED for complaints of Anxiety Currently, the patient is awaiting inpatient bed assignment.  Physical Exam  BP 112/78   Pulse 80   Temp 98.5 F (36.9 C) (Oral)   Resp 18   Wt 85.3 kg   SpO2 100%   BMI 30.35 kg/m  Physical Exam  ED Course / MDM  EKG:EKG Interpretation  Date/Time:  Tuesday June 17 2020 17:49:04 EDT Ventricular Rate:  66 PR Interval:    QRS Duration: 87 QT Interval:  439 QTC Calculation: 460 R Axis:   62 Text Interpretation: Incomplete analysis due to missing data in precordial lead(s) Sinus rhythm Atrial premature complex Missing lead(s): V6 Confirmed by Veryl Speak [phone]) on 06/17/2020 7:14:01 PM    I have reviewed the labs performed to date as well as medications administered while in observation.  Recent changes in the last 24 hours include acute on chronic pain requiring medication. Plan  Current plan is for admission to behavioral health, patient has been accepted by Dr. Mallie Darting. Patient is not under full IVC at this time.   Hayden Rasmussen, MD 06/19/20 [phone]

## 2020-06-19 NOTE — ED Notes (Signed)
Pt reports AP does not have daily medications accurately recorded. Pt also states that she is unable to take non-narcotic meds such as Tylenol and Ibuprofen because she will have gallbladder problems and have to be admitted to [facility]. Pt anxious, and calls out frequently for staff assistance. Established appropriate boundaries with pt involving rules. Pt angry that she cannot have personal items such as underwear.

## 2020-06-19 NOTE — BH Assessment (Signed)
Pt has been accepted to [facility] pending d/c. Pt's nurse will be contacted with her admit information once her bed is available later today. Pt's nurse, Lonn [place] RN, was provided this information at [phone].

## 2020-06-19 NOTE — Tx Team (Signed)
Initial Treatment Plan 06/19/2020 5:24 PM [name] OKH:[id number]    PATIENT STRESSORS: Health problems Marital or family conflict Other: COVID, kids in online school, kid w dev. delays   PATIENT STRENGTHS: Ability for insight Active sense of humor Average or above average intelligence Capable of independent living Child psychotherapist Supportive family/friends Work skills   PATIENT IDENTIFIED PROBLEMS: Anxiety  depression                   DISCHARGE CRITERIA:  Ability to meet basic life and health needs Improved stabilization in mood, thinking, and/or behavior Safe-care adequate arrangements made Verbal commitment to aftercare and medication compliance  PRELIMINARY DISCHARGE PLAN: Participate in family therapy Return to previous living arrangement Return to previous work or school arrangements  PATIENT/FAMILY INVOLVEMENT: This treatment plan has been presented to and reviewed with the patient, Penny [name] .  The patient has been given the opportunity to ask questions and make suggestions.  Penny Mew, RN 06/19/2020, 5:24 PM

## 2020-06-19 NOTE — ED Notes (Addendum)
Pt's supply of medication Buprenorphine strips sealed in envelope and handed to driver for pt to be transported to [facility].  CN E. Mare Ferrari is aware

## 2020-06-19 NOTE — BH Assessment (Signed)
Per Kathalene Frames, RN Director/AC, patient has been accepted to [facility], bed 307-2 ; Accepting provider is Tana Conch; Attending provider is Dr. Myles Lipps, MD.   Patient can arrive at anytime. Number for report is [phone].    Katie, RN notified, reports she will inform the patient's EDP.      Radonna Ricker, MSW, LCSW Clinical Social Worker [place]

## 2020-06-19 NOTE — Progress Notes (Signed)
Pt seen at nurse's station. Pt angry and irritated that she has not been given her pain medications. "I can't take Tylenol. I was in the hospital in [facility] with stents in my gallbladder from too much Tylenol and Ibuprofen. The lady at the ED in [facility] told me that they would not defer from my home meds when I came here. Someone needs to call that doctor and get him in here to fix this."  Pt told that she would have to see the provider to be given any control substances such as Dilaudid and Buprenorphine. "I was told to bring my Buprenorphine to the ED with me and also over here because they didn't have them in the pharmacy." Pt was again told that the provider would have to authorize this before anything could be done tonight. Pt upset but took her other medications. Pt also has order to use her body pillow for her chronic back pain d/t injury.
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
## 2020-06-20 ENCOUNTER — Telehealth: Payer: Self-pay | Admitting: *Deleted

## 2020-06-20 DIAGNOSIS — F332 Major depressive disorder, recurrent severe without psychotic features: Secondary | ICD-10-CM | POA: Diagnosis not present

## 2020-06-20 MED ORDER — BACLOFEN 10 MG PO TABS
5.0000 mg | ORAL_TABLET | Freq: Three times a day (TID) | ORAL | Status: DC | PRN
  Administered 2020-06-20 – 2020-06-23 (×6): 5 mg via ORAL
  Filled 2020-06-20 (×7): qty 1

## 2020-06-20 MED ORDER — BUPRENORPHINE HCL 75 MCG BU FILM
75.0000 ug | ORAL_FILM | Freq: Two times a day (BID) | BUCCAL | Status: DC
  Administered 2020-06-20 – 2020-06-23 (×6): 75 ug via BUCCAL

## 2020-06-20 MED ORDER — NON FORMULARY: 8.0000 mg | Freq: Three times a day (TID) | Status: DC

## 2020-06-20 MED ORDER — DIVALPROEX SODIUM 250 MG PO DR TAB
250.0000 mg | DELAYED_RELEASE_TABLET | Freq: Two times a day (BID) | ORAL | Status: DC
  Administered 2020-06-20 – 2020-06-21 (×3): 250 mg via ORAL
  Filled 2020-06-20 (×8): qty 1

## 2020-06-20 MED ORDER — HYDROMORPHONE HCL 2 MG PO TABS
4.0000 mg | ORAL_TABLET | Freq: Two times a day (BID) | ORAL | Status: DC | PRN
  Administered 2020-06-20 – 2020-06-23 (×7): 4 mg via ORAL
  Filled 2020-06-20 (×7): qty 2
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
## 2020-06-20 NOTE — Tx Team (Signed)
Interdisciplinary Treatment and Diagnostic Plan Update  06/20/2020 Time of Session: 9:25am Penny [name] MRN: [id number]  Principal Diagnosis: <principal problem not specified>  Secondary Diagnoses: Active Problems:   Major depression   Current Medications:  Current Facility-Administered Medications  Medication Dose Route Frequency Provider Last Rate Last Admin  . acetaminophen (TYLENOL) tablet 650 mg  650 mg Oral Q6H PRN Sharma Covert, MD      . albuterol (VENTOLIN HFA) 108 (90 Base) MCG/ACT inhaler 1-2 puff  1-2 puff Inhalation Q6H PRN Sharma Covert, MD      . alum & mag hydroxide-simeth (MAALOX/MYLANTA) 200-200-20 MG/5ML suspension 30 mL  30 mL Oral Q4H PRN Sharma Covert, MD      . Buprenorphine HCl FILM 75 mcg  75 mcg Buccal BID Sharma Covert, MD   Given at 06/20/20 [phone]  . busPIRone (BUSPAR) tablet 5 mg  5 mg Oral TID Sharma Covert, MD   5 mg at 06/20/20 0947  . clonazePAM (KLONOPIN) tablet 0.5 mg  0.5 mg Oral BID PRN Sharma Covert, MD   0.5 mg at 06/19/20 2131  . divalproex (DEPAKOTE) DR tablet 250 mg  250 mg Oral Q12H Sharma Covert, MD      . DULoxetine (CYMBALTA) DR capsule 60 mg  60 mg Oral BID Sharma Covert, MD   60 mg at 06/20/20 0947  . fluticasone (FLONASE) 50 MCG/ACT nasal spray 2 spray  2 spray Each Nare Daily Sharma Covert, MD      . fluticasone (FLOVENT HFA) 44 MCG/ACT inhaler 2 puff  2 puff Inhalation BID Sharma Covert, MD   2 puff at 06/20/20 0949  . HYDROmorphone (DILAUDID) tablet 4 mg  4 mg Oral Q12H PRN Sharma Covert, MD   4 mg at 06/20/20 0953  . ipratropium-albuterol (DUONEB) 0.5-2.5 (3) MG/3ML nebulizer solution 3 mL  3 mL Nebulization Q6H PRN Sharma Covert, MD      . magnesium hydroxide (MILK OF MAGNESIA) suspension 30 mL  30 mL Oral Daily PRN Sharma Covert, MD      . olopatadine (PATANOL) 0.1 % ophthalmic solution 1 drop  1 drop Both Eyes BID Sharma Covert, MD   1 drop at 06/20/20 0948  .  ondansetron (ZOFRAN-ODT) disintegrating tablet 8 mg  8 mg Oral Q8H PRN Sharma Covert, MD      . pantoprazole (PROTONIX) EC tablet 40 mg  40 mg Oral BID Sharma Covert, MD   40 mg at 06/20/20 0947  . promethazine (PHENERGAN) tablet 25 mg  25 mg Oral Q6H PRN Sharma Covert, MD   25 mg at 06/19/20 2131  . topiramate (TOPAMAX) tablet 100 mg  100 mg Oral BID Sharma Covert, MD   100 mg at 06/20/20 0947  . traZODone (DESYREL) tablet 150 mg  150 mg Oral QHS PRN Sharma Covert, MD   150 mg at 06/19/20 2130   PTA Medications: Medications Prior to Admission  Medication Sig Dispense Refill Last Dose  . albuterol (PROVENTIL) (2.5 MG/3ML) 0.083% nebulizer solution Take 3 mLs (2.5 mg total) by nebulization every 4 (four) hours as needed for wheezing. 150 mL 12   . amphetamine-dextroamphetamine (ADDERALL XR) 30 MG 24 hr capsule Take 1 capsule (30 mg total) by mouth daily. 30 capsule 0   . BELBUCA 75 MCG FILM Take 1 strip by mouth 2 (two) times daily.     . budesonide-formoterol (SYMBICORT) 160-4.5 MCG/ACT inhaler Inhale 2 puffs  into the lungs 2 (two) times daily. 1 Inhaler 5   . busPIRone (BUSPAR) 5 MG tablet Take 1 tablet (5 mg total) by mouth 3 (three) times daily. 90 tablet 2   . cetirizine (ZYRTEC) 10 MG tablet Take 1 tablet (10 mg total) by mouth daily. 30 tablet 5   . clonazePAM (KLONOPIN) 0.5 MG tablet Take 1 tablet (0.5 mg total) by mouth 2 (two) times daily as needed for anxiety. (Patient taking differently: Take 0.5 mg by mouth 2 (two) times daily. ) 60 tablet 2   . DULoxetine (CYMBALTA) 60 MG capsule Take 1 capsule (60 mg total) by mouth 2 (two) times daily. 180 capsule 2   . Erenumab-aooe (AIMOVIG) 70 MG/ML SOAJ Inject 70 mg into the skin every 28 (twenty-eight) days. 1 pen 11   . HYDROmorphone (DILAUDID) 2 MG tablet Take 1-2 tablets po q 4 hrs prn pain no greater than 9 tablets/day (Patient taking differently: Take 4 mg by mouth 5 (five) times daily. ) 65 tablet 0   .  levalbuterol (XOPENEX HFA) 45 MCG/ACT inhaler Inhale 2 puffs into the lungs every 6 (six) hours as needed for wheezing. 1 Inhaler 5   . montelukast (SINGULAIR) 10 MG tablet Take 1 tablet (10 mg total) by mouth at bedtime. (Patient not taking: Reported on 06/18/2020) 30 tablet 5   . ondansetron (ZOFRAN-ODT) 8 MG disintegrating tablet DISSOLVE 1 TABLET IN MOUTH EVERY 8 HOURS AS NEEDED. (Patient taking differently: Take 8 mg by mouth every 8 (eight) hours as needed for nausea or vomiting. ) 18 tablet 4   . pantoprazole (PROTONIX) 40 MG tablet TAKE 1 TABLET BY MOUTH TWICE DAILY BEFORE MEALS. (Patient taking differently: Take 40 mg by mouth 2 (two) times daily. ) 60 tablet 3   . phenazopyridine (PYRIDIUM) 200 MG tablet Take 200 mg by mouth daily as needed for pain.      . promethazine (PHENERGAN) 25 MG tablet TAKE (1) TABLET BY MOUTH TWICE A DAY AS NEEDED. (Patient taking differently: Take 25 mg by mouth. TAKE (1) TABLET BY MOUTH TWICE A DAY AS NEEDED.) 14 tablet 0   . tiZANidine (ZANAFLEX) 4 MG tablet Take 4 mg by mouth in the morning, at noon, and at bedtime.     . topiramate (TOPAMAX) 100 MG tablet Take 1 tablet (100 mg total) by mouth 2 (two) times daily. 60 tablet 2   . traZODone (DESYREL) 150 MG tablet Take 1 tablet (150 mg total) by mouth at bedtime. 30 tablet 2   . TRULANCE 3 MG TABS Take 3 mg by mouth daily.       Patient Stressors: Health problems Marital or family conflict Other: COVID, kids in online school, kid w dev. delays  Patient Strengths: Ability for insight Active sense of humor Average or above average intelligence Capable of independent living Child psychotherapist Supportive family/friends Work skills  Treatment Modalities: Medication Management, Group therapy, Case management,  1 to 1 session with clinician, Psychoeducation, Recreational therapy.   Physician Treatment Plan for Primary Diagnosis: <principal problem not specified> Long Term Goal(s):      Short Term Goals:    Medication Management: Evaluate patient's response, side effects, and tolerance of medication regimen.  Therapeutic Interventions: 1 to 1 sessions, Unit Group sessions and Medication administration.  Evaluation of Outcomes: Not Met  Physician Treatment Plan for Secondary Diagnosis: Active Problems:   Major depression  Long Term Goal(s):     Short Term Goals:  Medication Management: Evaluate patient's response, side effects, and tolerance of medication regimen.  Therapeutic Interventions: 1 to 1 sessions, Unit Group sessions and Medication administration.  Evaluation of Outcomes: Not Met   RN Treatment Plan for Primary Diagnosis: <principal problem not specified> Long Term Goal(s): Knowledge of disease and therapeutic regimen to maintain health will improve  Short Term Goals: Ability to remain free from injury will improve, Ability to identify and develop effective coping behaviors will improve and Compliance with prescribed medications will improve  Medication Management: RN will administer medications as ordered by provider, will assess and evaluate patient's response and provide education to patient for prescribed medication. RN will report any adverse and/or side effects to prescribing provider.  Therapeutic Interventions: 1 on 1 counseling sessions, Psychoeducation, Medication administration, Evaluate responses to treatment, Monitor vital signs and CBGs as ordered, Perform/monitor CIWA, COWS, AIMS and Fall Risk screenings as ordered, Perform wound care treatments as ordered.  Evaluation of Outcomes: Not Met   LCSW Treatment Plan for Primary Diagnosis: <principal problem not specified> Long Term Goal(s): Safe transition to appropriate next level of care at discharge, Engage patient in therapeutic group addressing interpersonal concerns.  Short Term Goals: Engage patient in aftercare planning with referrals and resources, Increase social support,  Identify triggers associated with mental health/substance abuse issues and Increase skills for wellness and recovery  Therapeutic Interventions: Assess for all discharge needs, 1 to 1 time with Social worker, Explore available resources and support systems, Assess for adequacy in community support network, Educate family and significant other(s) on suicide prevention, Complete Psychosocial Assessment, Interpersonal group therapy.  Evaluation of Outcomes: Not Met   Progress in Treatment: Attending groups: No. Participating in groups: No. Taking medication as prescribed: Yes. Toleration medication: Yes. Family/Significant other contact made: No, will contact:  if consent is given. Patient understands diagnosis: Yes. Discussing patient identified problems/goals with staff: Yes. Medical problems stabilized or resolved: Yes. Denies suicidal/homicidal ideation: Yes. Issues/concerns per patient self-inventory: No. Other: none  New problem(s) identified: No, Describe:  CSW will continue to assess.  New Short Term/Long Term Goal(s): medication stabilization, elimination of SI thoughts, development of comprehensive mental wellness plan.   Patient Goals:  "Work on my depression medications and my depression and anxiety"  Discharge Plan or Barriers: Patient recently admitted. CSW will continue to follow and assess for appropriate referrals and possible discharge planning.   Reason for Continuation of Hospitalization: Anxiety Depression Medication stabilization  Estimated Length of Stay: 3-5 days  Attendees: [name] 06/20/2020   Physician: Myles Lipps, MD 06/20/2020  Nursing:  06/20/2020   RN Care Manager: 06/20/2020   Social Worker: Darletta Moll, [place] 06/20/2020  Recreational Therapist:  06/20/2020   Other:  06/20/2020  Other:  06/20/2020   Other: 06/20/2020       Scribe for Treatment Team: Vassie Moselle, [place] 06/20/2020 11:35 AM

## 2020-06-20 NOTE — [facility] Suicide Risk Assessment (Signed)
[facility] Admission Suicide Risk Assessment   Nursing information obtained from:  Patient, Review of record Demographic factors:  Adolescent or young adult, Caucasian Current Mental Status:  NA Loss Factors:  Decrease in vocational status, Loss of significant relationship, Decline in physical health Historical Factors:  Prior suicide attempts, Family history of mental illness or substance abuse, Impulsivity Risk Reduction Factors:  Sense of responsibility to family, Employed, Positive social support, Responsible for children under 41 years of age, Religious beliefs about death, Living with another person, especially a relative, Positive therapeutic relationship  Total Time spent with patient: 45 minutes Principal Problem: <principal problem not specified> Diagnosis:  Active Problems:   Major depression  Subjective Data: Patient is seen and examined.  Patient is a 34 year old female with a past psychiatric history significant for depression, generalized anxiety disorder and attention deficit disorder who presented to the [facility] emergency department on 06/17/2020 with suicidal ideation.  The patient originally presented there for profuse watery diarrhea for 5 days, and had not been eating or drinking well.  She also stated that she had been extremely depressed and had not been getting out of bed fro the 5 days prior to admission.  She told the emergency room physicians that she did not feel safe with herself at home.  There is some information in the chart that stated that her father had told her that he would take her child, and that she needed to get help or they would not allow her to see the child.  The patient stated that the major stressors is been that her significant other/fianc's 2 children have been bullying her daughter, and that that has bothered her a great deal.  Her last video visit with her psychiatrist on 06/16/2020 stated that her 85-year-old daughter and her boyfriend's 59-year-old  daughter were found playing and what looked to be a "sexualized manner".  The children apparently claimed that they were just playing.  However the patient's daughter's father found out as well as her mother and they both became upset.  The patient's daughter is now staying at her father's house.  The patient apparently has reduced her clonazepam dose to 0.5 mg p.o. twice daily.  The patient is on long-term Dilaudid therapy and Dr. Harrington Challenger has attempted to wean her off the clonazepam given that.  She was attempting to get the patient into therapy and added BuSpar.  Patient stated that she had been on multiple antidepressant medications in the past. She has multiple drug allergies and multiple somatic complaints.  Her current psychiatric medications include Adderall XR, BuSpar, Klonopin, Cymbalta and trazodone.  She is also on topiramate for her migraine headache prevention.  She also is taking 4 mg of Dilaudid twice a day.  She is also on Belbuca for chronic pain as well.  The patient admitted to a history of pseudoseizures as well as some of the other issues and stated she believed that she had been previously treated with Tegretol in the past but not Depakote.  She admitted to suicidal ideation.  She was admitted to the hospital for evaluation and stabilization.  Continued Clinical Symptoms:  Alcohol Use Disorder Identification Test Final Score (AUDIT): 1 The "Alcohol Use Disorders Identification Test", Guidelines for Use in Primary Care, Second Edition.  World Pharmacologist [facility]). Score between 0-7:  no or low risk or alcohol related problems. Score between 8-15:  moderate risk of alcohol related problems. Score between 16-19:  high risk of alcohol related problems. Score 20 or  above:  warrants further diagnostic evaluation for alcohol dependence and treatment.   CLINICAL FACTORS:   Severe Anxiety and/or Agitation Depression:   Anhedonia Hopelessness Impulsivity Insomnia Dysthymia Previous  Psychiatric Diagnoses and Treatments Medical Diagnoses and Treatments/Surgeries   Musculoskeletal: Strength & Muscle Tone: within normal limits Gait & Station: normal Patient leans: N/A  Psychiatric Specialty Exam: Physical Exam Vitals and nursing note reviewed.  Constitutional:      Appearance: Normal appearance.  HENT:     Head: Normocephalic and atraumatic.  Pulmonary:     Effort: Pulmonary effort is normal.  Neurological:     General: No focal deficit present.     Mental Status: She is alert and oriented to person, place, and time.     Review of Systems  Blood pressure 108/90, pulse 85, temperature 98.5 F (36.9 C), temperature source Oral, resp. rate 16, SpO2 100 %.There is no height or weight on file to calculate BMI.  General Appearance: Disheveled  Eye Contact:  Good  Speech:  Normal Rate  Volume:  Decreased  Mood:  Anxious and Depressed  Affect:  Congruent  Thought Process:  Coherent and Descriptions of Associations: Intact  Orientation:  Full (Time, Place, and Person)  Thought Content:  Logical  Suicidal Thoughts:  Yes.  without intent/plan  Homicidal Thoughts:  No  Memory:  Immediate;   Fair Recent;   Fair Remote;   Fair  Judgement:  Intact  Insight:  Fair  Psychomotor Activity:  Increased  Concentration:  Concentration: Fair and Attention Span: Fair  Recall:  [employer] of Knowledge:  Fair  Language:  Fair  Akathisia:  Negative  Handed:  Right  AIMS (if indicated):     Assets:  Desire for Improvement Resilience Social Support  ADL's:  Intact  Cognition:  WNL  Sleep:  Number of Hours: 6.25      COGNITIVE FEATURES THAT CONTRIBUTE TO RISK:  None    SUICIDE RISK:   Mild:  Suicidal ideation of limited frequency, intensity, duration, and specificity.  There are no identifiable plans, no associated intent, mild dysphoria and related symptoms, good self-control (both objective and subjective assessment), few other risk factors, and identifiable  protective factors, including available and accessible social support.  PLAN OF CARE: Patient is seen and examined.  Patient is a 34 year old female with the above-stated past psychiatric history who is admitted secondary to worsening depression and suicidal ideation.  She will be admitted to the hospital.  She will be integrated in the milieu.  She will be encouraged to attend groups.  We discussed her psychiatric medications, she has been on multiple antidepressants in the past.  She stated that she had been doing well on the Cymbalta until he issues with the children and her fianc began to arise recently.  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  We discussed the possibility of augmenting with a mood stabilizing medication.  She stated she thought she had been previously treated with Tegretol, but had not received Depakote as far as she was aware of.  She denied any cognitive problems or paresthesias with the Topamax at this point.  We will start Depakote 250 mg p.o. twice daily and titrate this for mood stability.  We also discussed her complicated medical regimen including her controlled substances.  I did discuss with her recommendations of the CDC and the [place] board with regard to having benzodiazepines and opiates and in  her situation stimulants as well on board.  We discussed the scientific findings of unintentional and intentional overdoses of these medicines leading to death.  She prefers at this point to not change any of these medications, so I will not attempt to change these given her prescriptions as an outpatient.  We will continue buprenorphine 75 mcg p.o. twice daily.  We will continue the Dilaudid as previously written.  Her clonazepam will be written at 0.5 mg p.o. twice daily as needed anxiety.  The Cymbalta will be continued at 60 mg p.o. twice daily.  The rest of her outpatient medications including  Topamax will be continued at this point.  Review of her admission laboratories revealed a low potassium at 3.1, but otherwise normal electrolytes.  We will supplement her potassium.  Her CBC on admission was completely normal including her platelets.  Pregnancy test was negative.  Urinalysis was abnormal with 6-10 white blood cells but no bacteria was noted.  She apparently had a C. difficile done which was negative.  Blood alcohol was less than 10.  Drug screen was positive for benzodiazepines as well as marijuana.  There were no amphetamines in her system.  We will not continue her amphetamines while she is on the inpatient service.  We will collect collateral information with regard to these other issues.  I certify that inpatient services furnished can reasonably be expected to improve the patient's condition.   Sharma Covert, MD 06/20/2020, 12:42 PM

## 2020-06-20 NOTE — H&P (Signed)
Psychiatric Admission Assessment Adult  Patient Identification: Penny [name] MRN:  [id number] Date of Evaluation:  06/20/2020 Chief Complaint:  Major depression [F32.9] Principal Diagnosis: <principal problem not specified> Diagnosis:  Active Problems:   Major depression  History of Present Illness: Patient is seen and examined.  Patient is a 34 year old female with a past psychiatric history significant for depression, generalized anxiety disorder and attention deficit disorder who presented to the [facility] emergency department on 06/17/2020 with suicidal ideation.  The patient originally presented there for profuse watery diarrhea for 5 days, and had not been eating or drinking well.  She also stated that she had been extremely depressed and had not been getting out of bed fro the 5 days prior to admission.  She told the emergency room physicians that she did not feel safe with herself at home.  There is some information in the chart that stated that her father had told her that he would take her child, and that she needed to get help or they would not allow her to see the child.  The patient stated that the major stressors is been that her significant other/fianc's 2 children have been bullying her daughter, and that that has bothered her a great deal.  Her last video visit with her psychiatrist on 06/16/2020 stated that her 22-year-old daughter and her boyfriend's 24-year-old daughter were found playing and what looked to be a "sexualized manner".  The children apparently claimed that they were just playing.  However the patient's daughter's father found out as well as her mother and they both became upset.  The patient's daughter is now staying at her father's house.  The patient apparently has reduced her clonazepam dose to 0.5 mg p.o. twice daily.  The patient is on long-term Dilaudid therapy and Dr. Harrington Challenger has attempted to wean her off the clonazepam given that.  She was attempting to get the patient  into therapy and added BuSpar.  Patient stated that she had been on multiple antidepressant medications in the past. She has multiple drug allergies and multiple somatic complaints.  Her current psychiatric medications include Adderall XR, BuSpar, Klonopin, Cymbalta and trazodone.  She is also on topiramate for her migraine headache prevention.  She also is taking 4 mg of Dilaudid twice a day.  She is also on Belbuca for chronic pain as well.  The patient admitted to a history of pseudoseizures as well as some of the other issues and stated she believed that she had been previously treated with Tegretol in the past but not Depakote.  She admitted to suicidal ideation.  She was admitted to the hospital for evaluation and stabilization.  Associated Signs/Symptoms: Depression Symptoms:  depressed mood, anhedonia, insomnia, psychomotor agitation, psychomotor retardation, fatigue, feelings of worthlessness/guilt, difficulty concentrating, hopelessness, suicidal thoughts without plan, anxiety, panic attacks, loss of energy/fatigue, disturbed sleep, (Hypo) Manic Symptoms:  Impulsivity, Irritable Mood, Labiality of Mood, Anxiety Symptoms:  Excessive Worry, Psychotic Symptoms:  Denied PTSD Symptoms: Had a traumatic exposure:  In the past Total Time spent with patient: 1 hour  Past Psychiatric History: She apparently had a psychiatric hospitalization somewhere between 2018 and 2019 at [facility] [place].  She had apparently had a pseudoseizure prior to that hospitalization.  She has been on multiple medications in the past.  It does appear that she was hospitalized at [facility] on [place] ward at that time.  That in the electronic medical record.  Discharge summary is not available.  She has  been previously hospitalized when she was younger.  Most recently she has been followed by Dr. Harrington Challenger in [place].  Is the patient at risk to self? Yes.    Has the  patient been a risk to self in the past 6 months? Yes.    Has the patient been a risk to self within the distant past? Yes.    Is the patient a risk to others? No.  Has the patient been a risk to others in the past 6 months? No.  Has the patient been a risk to others within the distant past? No.   Prior Inpatient Therapy:   Prior Outpatient Therapy:    Alcohol Screening: 1. How often do you have a drink containing alcohol?: Monthly or less 2. How many drinks containing alcohol do you have on a typical day when you are drinking?: 1 or 2 3. How often do you have six or more drinks on one occasion?: Never AUDIT-C Score: 1 4. How often during the last year have you found that you were not able to stop drinking once you had started?: Never 5. How often during the last year have you failed to do what was normally expected from you because of drinking?: Never 6. How often during the last year have you needed a first drink in the morning to get yourself going after a heavy drinking session?: Never 7. How often during the last year have you had a feeling of guilt of remorse after drinking?: Never 8. How often during the last year have you been unable to remember what happened the night before because you had been drinking?: Never 9. Have you or someone else been injured as a result of your drinking?: No 10. Has a relative or friend or a doctor or another health worker been concerned about your drinking or suggested you cut down?: No Alcohol Use Disorder Identification Test Final Score (AUDIT): 1 Substance Abuse History in the last 12 months:  No. Consequences of Substance Abuse: Negative Previous Psychotropic Medications: Yes  Psychological Evaluations: Yes  Past Medical History:  Past Medical History:  Diagnosis Date  . ADD (attention deficit disorder)   . Anemia 2011   2o to GASTRIC BYPASS  . Anginal pain ([place])   . Anxiety   . Blood transfusion without reported diagnosis   . Chronic daily  headache   . Depression   . Depression   . Dysmenorrhea 09/18/2013  . Dysrhythmia   . Elevated liver enzymes JUL 2011ALK PHOS 111-127 AST  143-267 ALT  213-321T BILI 0.6  ALB  3.7-4.06 Jun 2011 ALK PHOS 118 AST 24 ALT 42 T BILI 0.4 ALB 3.9  . Encounter for drug rehabilitation    at behavioral health for opioid addiction about 4 years ago  . Family history of adverse reaction to anesthesia    'dad had to be kept on pump for breathing for morphine'  . Fatty liver   . Fibroid 01/18/2017  . Fibromyalgia   . Gastric bypass status for obesity   . Gastritis JULY 2011  . Heart murmur   . 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 History of Holter monitoring   . Hx of opioid abuse ([place])    for about 4 years, about 4 years ago  . Interstitial cystitis   . Iron deficiency anemia 07/23/2010  . Irritable bowel syndrome 2012 DIARRHEA   JUN 2012 TTG IgA 14.9  . IUD FEB 2010  . Lupus ([place])   .  Menorrhagia 07/18/2013  . Migraines   . Obesity (BMI 30-39.9) 2011 228 LBS BMI 36.8  . Ovarian cyst   . Patient desires pregnancy 09/18/2013  . Polyneuropathy   . PONV (postoperative nausea and vomiting) seizure post-operatively   seizure following ankle surgery  . Potassium (K) deficiency   . Pregnant 12/25/2013  . Psychiatric pseudoseizure   . RLQ abdominal pain 07/18/2013  . Sciatica of left side 11/14/2014  . 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 Stress 09/18/2013    Past Surgical History:  Procedure Laterality Date  . BIOPSY  04/10/2015   Procedure: BIOPSY;  Surgeon: Daneil Dolin, MD;  Location: [facility];  Service: Endoscopy;;  . cath self every nite     for sodium bicarb injection (discontinued 2013)  . CHOLECYSTECTOMY  2005   biliary dyskinesia  . COLONOSCOPY  JUN 2012 ABD PN/DIARRHEA WITH PROPOFOL   NL COLON  . DILATION AND CURETTAGE OF UTERUS    . DILITATION & CURRETTAGE/HYSTROSCOPY WITH NOVASURE ABLATION N/A 03/24/2017   Procedure: DILATATION &  CURETTAGE/HYSTEROSCOPY WITH NOVASURE ENDOMETRIAL ABLATION;  Surgeon: Jonnie Kind, MD;  Location: [facility];  Service: Gynecology;  Laterality: N/A;  . ESOPHAGEAL DILATION N/A 04/10/2015   Procedure: ESOPHAGEAL DILATION WITH 54FR MALONEY DILATOR;  Surgeon: Daneil Dolin, MD;  Location: [facility];  Service: Endoscopy;  Laterality: N/A;  . ESOPHAGOGASTRODUODENOSCOPY    . ESOPHAGOGASTRODUODENOSCOPY (EGD) WITH PROPOFOL N/A 04/10/2015   Procedure: ESOPHAGOGASTRODUODENOSCOPY (EGD) WITH PROPOFOL;  Surgeon: Daneil Dolin, MD;  Location: [facility];  Service: Endoscopy;  Laterality: N/A;  . ESOPHAGOGASTRODUODENOSCOPY (EGD) WITH PROPOFOL N/A 12/06/2016   Procedure: ESOPHAGOGASTRODUODENOSCOPY (EGD) WITH PROPOFOL;  Surgeon: Danie Binder, MD;  [facility];  Service: Endoscopy;  Laterality: N/A;  . ESOPHAGOGASTRODUODENOSCOPY (EGD) WITH PROPOFOL N/A 04/27/2019   Procedure: ESOPHAGOGASTRODUODENOSCOPY (EGD) WITH PROPOFOL;  Surgeon: Rogene Houston, MD;  [facility];  Service: Endoscopy;  Laterality: N/A;  730  . ESOPHAGOGASTRODUODENOSCOPY (EGD) WITH PROPOFOL N/A 08/31/2019   Procedure: ESOPHAGOGASTRODUODENOSCOPY (EGD) WITH PROPOFOL;  Surgeon: Rogene Houston, MD;  [facility];  Service: Endoscopy;  Laterality: N/A;  . GAB  2007   in [place]-POUCH 5 CM  . GASTRIC BYPASS  06/2006  . HYSTEROSCOPY WITH D & C N/A 09/12/2014   Procedure: DILATATION AND CURETTAGE /HYSTEROSCOPY;  Surgeon: Jonnie Kind, MD;  Location: [facility];  Service: Gynecology;  Laterality: N/A;  . KYPHOPLASTY N/A 08/02/2018   Procedure: KYPHOPLASTY T12;  Surgeon: Melina Schools, MD;  Location: [facility];  Service: Orthopedics;  Laterality: N/A;  60 mins  . LAPAROSCOPIC TUBAL LIGATION Bilateral 03/24/2017   Procedure: LAPAROSCOPIC TUBAL LIGATION ([place] Rings);  Surgeon: Jonnie Kind, MD;  Location: [facility];  Service: Gynecology;  Laterality: Bilateral;  . REPAIR VAGINAL CUFF N/A 07/30/2014   Procedure: REPAIR VAGINAL CUFF;   Surgeon: Mora Bellman, MD;  Location: [facility];  Service: Gynecology;  Laterality: N/A;  . SAVORY DILATION  06/20/2012   Dr. Barnie Alderman gastritis/Ulcer in the mid jejunum. Empiric dilation.   . SURAL NERVE BX Left 02/25/2016   Procedure: LEFT SURAL NERVE BIOPSY;  Surgeon: Jovita Gamma, MD;  Location: [facility];  Service: Neurosurgery;  Laterality: Left;  Left sural nerve biopsy  . TONSILLECTOMY    . TONSILLECTOMY AND ADENOIDECTOMY    . UPPER GASTROINTESTINAL ENDOSCOPY  JULY 2011 NAUSEA-D125,V6, PH 25   Bx; GASTRITIS, POUCH-5 CM LONG  . WISDOM TOOTH EXTRACTION     Family History:  Family History  Problem Relation Age of Onset  . Hemochromatosis Maternal Grandmother   . Migraines Maternal Grandmother   . Cancer Maternal Grandmother   . Breast cancer Maternal Grandmother   . Hypertension Father   . Diabetes Father   . Coronary artery disease Father   . Migraines Paternal Grandmother   . Breast cancer Paternal Grandmother   . Cancer Mother        breast  . Hemochromatosis Mother   . Breast cancer Mother   . Depression Mother   . Anxiety disorder Mother   . Coronary artery disease Paternal Grandfather   . Anxiety disorder Brother   . Bipolar disorder Brother   . Healthy Daughter   . Healthy Son    Family Psychiatric  History: History of depression in several family members and at least one suicide of completion. Tobacco Screening:   Social History:  Social History   Substance and Sexual Activity  Alcohol Use Yes  . Alcohol/week: 0.0 standard drinks   Comment: 1-2 beers a couple times a month      Social History   Substance and Sexual Activity  Drug Use Not Currently  . Types: Marijuana   Comment: Previously opioid addiction went through rehab    Additional Social History: Marital status: Long term relationship Long term relationship, how long?: Fiance 2 years Are you sexually active?: Yes What is your sexual orientation?: heterosexual Does patient have  children?: Yes How many children?: 2 How is patient's relationship with their children?: "Good. I want to be a good mother for them"                         Allergies:   Allergies  Allergen Reactions  . Gabapentin Other (See Comments)    Pt states that she was unresponsive after taking this medication, but her vitals remained stable.    . Metoclopramide Hcl Anxiety and Other (See Comments)    Pt states that she felt like she was trapped in a box, and could not get out.  Pt also states that she had temporary loss of movement, weakness, and tingling.    . Other Itching, Rash and Hives    Please use paper tape Other reaction(s): Other (See Comments) Please use paper tape  . Tramadol Other (See Comments)    Seizures  . Ativan [Lorazepam] Other (See Comments)    combative  . Latex Itching, Rash and Other (See Comments)    B[place]  . Lyrica [Pregabalin] Other (See Comments)    suicidal  . Trazodone And Nefazodone Other (See Comments)    Makes pt "like a zombie"  . Zofran [Ondansetron] Other (See Comments)    Migraines in high doses    . Iron Other (See Comments)    PATIENT IS INTOLERANT TO ORAL IRON , SHE DOES NOT ABSORB IT.    Marland Kitchen Sulfa Antibiotics Hives  . Butrans [Buprenorphine] Rash    Patch caused rash, didn't help with pain  . Nucynta [Tapentadol] Nausea And Vomiting  . Propofol Nausea And Vomiting  . Tape Itching, Rash and Other (See Comments)    Please use paper tape  . Toradol [Ketorolac Tromethamine] Anxiety   Lab Results: No results found. However, due to the size of the patient record, not all encounters were searched. Please check Results Review for a complete set of results.  Blood Alcohol level:  Lab Results  Component Value Date   ETH <10 06/17/2020   ETH <  10 18/29/9371    Metabolic Disorder Labs:  Lab Results  Component Value Date   HGBA1C 5.7 (H) 01/15/2015   Lab Results  Component Value Date   PROLACTIN 80.0 (H) 05/09/2015   Lab Results   Component Value Date   CHOL 95 03/10/2012   TRIG 50 03/10/2012   HDL 38 (L) 03/10/2012   CHOLHDL 2.5 03/10/2012   VLDL 10 03/10/2012   LDLCALC 47 03/10/2012    Current Medications: Current Facility-Administered Medications  Medication Dose Route Frequency Provider Last Rate Last Admin  . acetaminophen (TYLENOL) tablet 650 mg  650 mg Oral Q6H PRN Sharma Covert, MD      . albuterol (VENTOLIN HFA) 108 (90 Base) MCG/ACT inhaler 1-2 puff  1-2 puff Inhalation Q6H PRN Sharma Covert, MD      . alum & mag hydroxide-simeth (MAALOX/MYLANTA) 200-200-20 MG/5ML suspension 30 mL  30 mL Oral Q4H PRN Sharma Covert, MD      . baclofen (LIORESAL) tablet 5 mg  5 mg Oral TID PRN Sharma Covert, MD      . Buprenorphine HCl FILM 75 mcg  75 mcg Buccal BID Sharma Covert, MD   Given at 06/20/20 [phone]  . busPIRone (BUSPAR) tablet 5 mg  5 mg Oral TID Sharma Covert, MD   5 mg at 06/20/20 1254  . clonazePAM (KLONOPIN) tablet 0.5 mg  0.5 mg Oral BID PRN Sharma Covert, MD   0.5 mg at 06/20/20 1253  . divalproex (DEPAKOTE) DR tablet 250 mg  250 mg Oral Q12H Sharma Covert, MD   250 mg at 06/20/20 1140  . DULoxetine (CYMBALTA) DR capsule 60 mg  60 mg Oral BID Sharma Covert, MD   60 mg at 06/20/20 0947  . fluticasone (FLONASE) 50 MCG/ACT nasal spray 2 spray  2 spray Each Nare Daily Sharma Covert, MD      . fluticasone (FLOVENT HFA) 44 MCG/ACT inhaler 2 puff  2 puff Inhalation BID Sharma Covert, MD   2 puff at 06/20/20 0949  . HYDROmorphone (DILAUDID) tablet 4 mg  4 mg Oral Q12H PRN Sharma Covert, MD   4 mg at 06/20/20 0953  . ipratropium-albuterol (DUONEB) 0.5-2.5 (3) MG/3ML nebulizer solution 3 mL  3 mL Nebulization Q6H PRN Sharma Covert, MD      . magnesium hydroxide (MILK OF MAGNESIA) suspension 30 mL  30 mL Oral Daily PRN Sharma Covert, MD      . olopatadine (PATANOL) 0.1 % ophthalmic solution 1 drop  1 drop Both Eyes BID Sharma Covert, MD   1 drop  at 06/20/20 0948  . ondansetron (ZOFRAN-ODT) disintegrating tablet 8 mg  8 mg Oral Q8H PRN Sharma Covert, MD   8 mg at 06/20/20 1142  . pantoprazole (PROTONIX) EC tablet 40 mg  40 mg Oral BID Sharma Covert, MD   40 mg at 06/20/20 0947  . promethazine (PHENERGAN) tablet 25 mg  25 mg Oral Q6H PRN Sharma Covert, MD   25 mg at 06/19/20 2131  . topiramate (TOPAMAX) tablet 100 mg  100 mg Oral BID Sharma Covert, MD   100 mg at 06/20/20 0947  . traZODone (DESYREL) tablet 150 mg  150 mg Oral QHS PRN Sharma Covert, MD   150 mg at 06/19/20 2130   PTA Medications: Medications Prior to Admission  Medication Sig Dispense Refill Last Dose  . albuterol (PROVENTIL) (2.5 MG/3ML) 0.083% nebulizer solution Take  3 mLs (2.5 mg total) by nebulization every 4 (four) hours as needed for wheezing. 150 mL 12   . amphetamine-dextroamphetamine (ADDERALL XR) 30 MG 24 hr capsule Take 1 capsule (30 mg total) by mouth daily. 30 capsule 0   . BELBUCA 75 MCG FILM Take 1 strip by mouth 2 (two) times daily.     . budesonide-formoterol (SYMBICORT) 160-4.5 MCG/ACT inhaler Inhale 2 puffs into the lungs 2 (two) times daily. 1 Inhaler 5   . busPIRone (BUSPAR) 5 MG tablet Take 1 tablet (5 mg total) by mouth 3 (three) times daily. 90 tablet 2   . cetirizine (ZYRTEC) 10 MG tablet Take 1 tablet (10 mg total) by mouth daily. 30 tablet 5   . clonazePAM (KLONOPIN) 0.5 MG tablet Take 1 tablet (0.5 mg total) by mouth 2 (two) times daily as needed for anxiety. (Patient taking differently: Take 0.5 mg by mouth 2 (two) times daily. ) 60 tablet 2   . DULoxetine (CYMBALTA) 60 MG capsule Take 1 capsule (60 mg total) by mouth 2 (two) times daily. 180 capsule 2   . Erenumab-aooe (AIMOVIG) 70 MG/ML SOAJ Inject 70 mg into the skin every 28 (twenty-eight) days. 1 pen 11   . HYDROmorphone (DILAUDID) 2 MG tablet Take 1-2 tablets po q 4 hrs prn pain no greater than 9 tablets/day (Patient taking differently: Take 4 mg by mouth 5 (five)  times daily. ) 65 tablet 0   . levalbuterol (XOPENEX HFA) 45 MCG/ACT inhaler Inhale 2 puffs into the lungs every 6 (six) hours as needed for wheezing. 1 Inhaler 5   . montelukast (SINGULAIR) 10 MG tablet Take 1 tablet (10 mg total) by mouth at bedtime. (Patient not taking: Reported on 06/18/2020) 30 tablet 5   . ondansetron (ZOFRAN-ODT) 8 MG disintegrating tablet DISSOLVE 1 TABLET IN MOUTH EVERY 8 HOURS AS NEEDED. (Patient taking differently: Take 8 mg by mouth every 8 (eight) hours as needed for nausea or vomiting. ) 18 tablet 4   . pantoprazole (PROTONIX) 40 MG tablet TAKE 1 TABLET BY MOUTH TWICE DAILY BEFORE MEALS. (Patient taking differently: Take 40 mg by mouth 2 (two) times daily. ) 60 tablet 3   . phenazopyridine (PYRIDIUM) 200 MG tablet Take 200 mg by mouth daily as needed for pain.      . promethazine (PHENERGAN) 25 MG tablet TAKE (1) TABLET BY MOUTH TWICE A DAY AS NEEDED. (Patient taking differently: Take 25 mg by mouth. TAKE (1) TABLET BY MOUTH TWICE A DAY AS NEEDED.) 14 tablet 0   . tiZANidine (ZANAFLEX) 4 MG tablet Take 4 mg by mouth in the morning, at noon, and at bedtime.     . topiramate (TOPAMAX) 100 MG tablet Take 1 tablet (100 mg total) by mouth 2 (two) times daily. 60 tablet 2   . traZODone (DESYREL) 150 MG tablet Take 1 tablet (150 mg total) by mouth at bedtime. 30 tablet 2   . TRULANCE 3 MG TABS Take 3 mg by mouth daily.       Musculoskeletal: Strength & Muscle Tone: decreased Gait & Station: normal Patient leans: N/A  Psychiatric Specialty Exam: Physical Exam Vitals and nursing note reviewed.  Constitutional:      Appearance: Normal appearance.  HENT:     Head: Normocephalic and atraumatic.  Pulmonary:     Effort: Pulmonary effort is normal.  Neurological:     General: No focal deficit present.     Mental Status: She is alert and oriented to person, place, and  time.     Review of Systems  Blood pressure 97/70, pulse 71, temperature 98.5 F (36.9 C), temperature  source Oral, resp. rate 16, SpO2 100 %.There is no height or weight on file to calculate BMI.  General Appearance: Disheveled  Eye Contact:  Fair  Speech:  Normal Rate  Volume:  Normal  Mood:  Anxious and Depressed  Affect:  Congruent  Thought Process:  Coherent and Descriptions of Associations: Circumstantial  Orientation:  Full (Time, Place, and Person)  Thought Content:  Logical  Suicidal Thoughts:  Yes.  without intent/plan  Homicidal Thoughts:  No  Memory:  Immediate;   Poor Recent;   Fair Remote;   Fair  Judgement:  Impaired  Insight:  Lacking  Psychomotor Activity:  Decreased  Concentration:  Concentration: Fair and Attention Span: Fair  Recall:  [employer] of Knowledge:  Fair  Language:  Fair  Akathisia:  Negative  Handed:  Right  AIMS (if indicated):     Assets:  Desire for Improvement Resilience  ADL's:  Intact  Cognition:  WNL  Sleep:  Number of Hours: 6.25    Treatment Plan Summary: Daily contact with patient to assess and evaluate symptoms and progress in treatment, Medication management and Plan : Patient is seen and examined.  Patient is a 34 year old female with the above-stated past psychiatric history who is admitted secondary to worsening depression and suicidal ideation.  She will be admitted to the hospital.  She will be integrated in the milieu.  She will be encouraged to attend groups.  We discussed her psychiatric medications, she has been on multiple antidepressants in the past.  She stated that she had been doing well on the Cymbalta until he issues with the children and her fianc began to arise recently.  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  We discussed the possibility of augmenting with a mood stabilizing medication.  She stated she thought she had been previously treated with Tegretol, but had not received Depakote as far as she was aware of.  She denied any cognitive problems  or paresthesias with the Topamax at this point.  We will start Depakote 250 mg p.o. twice daily and titrate this for mood stability.  We also discussed her complicated medical regimen including her controlled substances.  I did discuss with her recommendations of the CDC and the [place] board with regard to having benzodiazepines and opiates and in her situation stimulants as well on board.  We discussed the scientific findings of unintentional and intentional overdoses of these medicines leading to death.  She prefers at this point to not change any of these medications, so I will not attempt to change these given her prescriptions as an outpatient.  We will continue buprenorphine 75 mcg p.o. twice daily.  We will continue the Dilaudid as previously written.  Her clonazepam will be written at 0.5 mg p.o. twice daily as needed anxiety.  The Cymbalta will be continued at 60 mg p.o. twice daily.  The rest of her outpatient medications including Topamax will be continued at this point.  Review of her admission laboratories revealed a low potassium at 3.1, but otherwise normal electrolytes.  We will supplement her potassium.  Her CBC on admission was completely normal including her platelets.  Pregnancy test was negative.  Urinalysis was abnormal with 6-10 white blood cells but no bacteria was noted.  She apparently had a C. difficile  done which was negative.  Blood alcohol was less than 10.  Drug screen was positive for benzodiazepines as well as marijuana.  There were no amphetamines in her system.  We will not continue her amphetamines while she is on the inpatient service.  We will collect collateral information with regard to these other issues.  Observation Level/Precautions:  Fall 15 minute checks  Laboratory:  Chemistry Profile  Psychotherapy:    Medications:    Consultations:    Discharge Concerns:    Estimated LOS:  Other:     Physician Treatment Plan for Primary Diagnosis:  <principal problem not specified> Long Term Goal(s): Improvement in symptoms so as ready for discharge  Short Term Goals: Ability to identify changes in lifestyle to reduce recurrence of condition will improve, Ability to verbalize feelings will improve, Ability to disclose and discuss suicidal ideas, Ability to demonstrate self-control will improve, Ability to identify and develop effective coping behaviors will improve and Ability to maintain clinical measurements within normal limits will improve  Physician Treatment Plan for Secondary Diagnosis: Active Problems:   Major depression  Long Term Goal(s): Improvement in symptoms so as ready for discharge  Short Term Goals: Ability to identify changes in lifestyle to reduce recurrence of condition will improve, Ability to verbalize feelings will improve, Ability to disclose and discuss suicidal ideas, Ability to demonstrate self-control will improve, Ability to identify and develop effective coping behaviors will improve and Ability to maintain clinical measurements within normal limits will improve  I certify that inpatient services furnished can reasonably be expected to improve the patient's condition.    Sharma Covert, MD 7/16/20215:29 PM

## 2020-06-20 NOTE — Progress Notes (Signed)
   06/19/20 2030  Psych Admission Type (Psych Patients Only)  Admission Status Voluntary  Psychosocial Assessment  Patient Complaints Anger;Anxiety;Irritability  Eye Contact Fair  Facial Expression Animated;Anxious  Affect Anxious  Speech Logical/coherent  Interaction Assertive;Demanding  Motor Activity Other (Comment) (WNL)  Appearance/Hygiene Unremarkable  Behavior Characteristics Agitated;Irritable;Anxious  Mood Anxious;Irritable  Thought Process  Coherency Concrete thinking  Content Blaming others  Delusions None reported or observed  Perception WDL  Hallucination None reported or observed  Judgment Poor  Confusion None  Danger to Self  Current suicidal ideation? Denies  Danger to Others  Danger to Others None reported or observed   Pt rates anxiety 7/10 and pain 7-8/10 as chronic pain in her back after injury at work.

## 2020-06-20 NOTE — [facility] Suicide Risk Assessment (Signed)
[place] INPATIENT:  Family/Significant Other Suicide Prevention Education  Suicide Prevention Education:  Education Completed; Randol Kern,  (name of family member/significant other) has been identified by the patient as the family member/significant other with whom the patient will be residing, and identified as the person(s) who will aid the patient in the event of a mental health crisis (suicidal ideations/suicide attempt).  With written consent from the patient, the family member/significant other has been provided the following suicide prevention education, prior to the and/or following the discharge of the patient.  The suicide prevention education provided includes the following:  Suicide risk factors  Suicide prevention and interventions  National Suicide Hotline telephone number  [facility] assessment telephone number  [facility] and/or Residential Mobile Crisis Unit telephone number  Request made of family/significant other to:  Remove weapons (e.g., guns, rifles, knives), all items previously/currently identified as safety concern.    Remove drugs/medications (over-the-counter, prescriptions, illicit drugs), all items previously/currently identified as a safety concern.  The family member/significant other verbalizes understanding of the suicide prevention education information provided.  The family member/significant other agrees to remove the items of safety concern listed above.  Bethann Berkshire 06/20/2020, 4:25 PM

## 2020-06-20 NOTE — [facility] Suicide Risk Assessment (Signed)
[place] INPATIENT:  Family/Significant Other Suicide Prevention Education  Suicide Prevention Education:  Education Completed; [name] Mom,  (name of family member/significant other) has been identified by the patient as the family member/significant other with whom the patient will be residing, and identified as the person(s) who will aid the patient in the event of a mental health crisis (suicidal ideations/suicide attempt).  With written consent from the patient, the family member/significant other has been provided the following suicide prevention education, prior to the and/or following the discharge of the patient.  The suicide prevention education provided includes the following:  Suicide risk factors  Suicide prevention and interventions  National Suicide Hotline telephone number  [facility] assessment telephone number  [facility] and/or Residential Mobile Crisis Unit telephone number  Request made of family/significant other to:  Remove weapons (e.g., guns, rifles, knives), all items previously/currently identified as safety concern.    Remove drugs/medications (over-the-counter, prescriptions, illicit drugs), all items previously/currently identified as a safety concern.  The family member/significant other verbalizes understanding of the suicide prevention education information provided.  The family member/significant other agrees to remove the items of safety concern listed above.  Mom confirms that she would be able to pick pt up upon d/c and that pt would d/c to her home with fiance and children. Mom prefers IOP that is in person as she states pt needs to be more motivated and not be able to skip as easy.  Bethann Berkshire 06/20/2020, 4:26 PM

## 2020-06-20 NOTE — Telephone Encounter (Signed)
Patient appeared on [facility] Managed Care Transitional Care Management follow up list. Patient remains hospitalized. Will follow up after hospital discharge.   [place] Management Coordinator Direct Dial:  [phone]   Fax: [phone]

## 2020-06-21 DIAGNOSIS — F332 Major depressive disorder, recurrent severe without psychotic features: Secondary | ICD-10-CM | POA: Diagnosis not present

## 2020-06-21 MED ORDER — DIVALPROEX SODIUM 500 MG PO DR TAB
500.0000 mg | DELAYED_RELEASE_TABLET | Freq: Two times a day (BID) | ORAL | Status: DC
  Administered 2020-06-21 – 2020-06-23 (×4): 500 mg via ORAL
  Filled 2020-06-21 (×9): qty 1

## 2020-06-21 MED ORDER — TRAZODONE HCL 100 MG PO TABS
200.0000 mg | ORAL_TABLET | Freq: Every day | ORAL | Status: DC
  Administered 2020-06-21 – 2020-06-22 (×2): 200 mg via ORAL
  Filled 2020-06-21 (×5): qty 2

## 2020-06-21 MED ORDER — POTASSIUM CHLORIDE CRYS ER 20 MEQ PO TBCR
20.0000 meq | EXTENDED_RELEASE_TABLET | Freq: Two times a day (BID) | ORAL | Status: DC
  Administered 2020-06-21 – 2020-06-23 (×4): 20 meq via ORAL
  Filled 2020-06-21 (×9): qty 1

## 2020-06-21 NOTE — Progress Notes (Signed)
Pt. Attend wrap up group AA.

## 2020-06-21 NOTE — [facility] Group Notes (Signed)
Pt did not attend wrap up group this evening. Pt was in bed sleeping.  

## 2020-06-21 NOTE — Plan of Care (Signed)
Patient stayed in bed and woke up later. Currently visible in the milieu, calm and cooperative. Alert and oriented and denying thoughts of self harm. Denying hallucinations. Patient is participating in activities. Received AM scheduled medications and requested Delaudid and Clonopin as prescribed. Has no additional concern so far. Support and encouragements provided. Safety monitored as expected.

## 2020-06-21 NOTE — Progress Notes (Signed)
   06/21/20 0000  Psych Admission Type (Psych Patients Only)  Admission Status Voluntary  Psychosocial Assessment  Patient Complaints Depression  Eye Contact Fair  Facial Expression Flat  Affect Appropriate to circumstance  Speech Logical/coherent  Interaction Assertive  Appearance/Hygiene Unremarkable  Behavior Characteristics Cooperative  Mood Depressed;Anxious  Thought Process  Coherency WDL  Content WDL  Delusions WDL  Perception WDL  Hallucination None reported or observed  Judgment WDL  Confusion WDL  Danger to Self  Current suicidal ideation? Denies  Danger to Others  Danger to Others None reported or observed  Patient very medication focused tonight. Bright and playing cards around peers but says her mood has been up and down. Having trouble falling asleep tonight but already taking Trazodone 150mg  along with many sedating medications already so on-call not notified. Encouraged the patient to talk to physician in am if still having trouble sleeping at night.

## 2020-06-21 NOTE — Progress Notes (Addendum)
[facility] MD Progress Note  06/21/2020 12:41 PM Penny [name]  MRN:  [id number] Subjective:  Patient is a 34 year old female with a past psychiatric history significant for depression, generalized anxiety disorder and attention deficit disorder who presented to the [facility] emergency department on 06/17/2020 with suicidal ideation. Pt initially presented there for Profuse diarrhea for 5 days. At admission, Pt states that her anxiety and depression has gotten worse since COVID started about one and half year ago. Pt lives with her fiance and 2 of her own kids and 2 of his fiance's kids. Pt states she has been stressed out lately because of her kids being home and doing virtual school. Pt states " I can't be the only parent at home and I don't want to ask for help as they are my kids". Pt states she recently had a mental breakdown when she found her 33 year old and 33 year old playing in a sexualized manner. On questioning children said they were just playing. Pt's daughter father found that too as well as her mother so they become upset too.The patient's daughter is now staying at her father's house. Pt states the mother of fiance's children doesn't want her to have custody of her daughters. Pt states her step dad told her hat he will take care of the kids and she should get some help. Pt reports low and depressed mood, low appetite, crying episodes and anhedonia. Pt states she has multiple medical problems and she can't take care of all responsibilities at home and is stressing about that. Pt is on long term Dilaudid therapy and Dr. Harrington Challenger has attempted to wean off her Clonazepam. She added Buspar. Pt had been on multiple antidepressant medications in the past and has multiple allergies.  Pt is seen and examined today. Currently, Pt denies any suicidal ideation and homicidal ideation. Pt denies visual, tactile and auditory hallucinations. Pt states she is feeling little better. Pt endorses low, depressed mood and  anhedonia. Pt reports low appetite states she feels nauseous sometimes. Pt states she is stressing out about what's going at home and about the what medications she is taking here. Pt states "I can't look up on Internet what you have been giving me and that stresses me out". Pt states she doesn't want to ask for help from her parents as she thinks she should take care of her kids by herself. Pt denies any visual, auditory and tactile hallucinations. Pt denies paranoia. Pt reports fatigue and low energy. Pt endorses racing thoughts and is worrying about his home situation. Pt states she slept well last night but gets bored during day time. Pt states she went to day room and socialize with other patients. Pt reports back pain and demanded her home medications.  On examination, Pt is lying in the bed as she states she has pain and don't want to stand. Pt seems very labile, tearful, anxious with depressed mood and labile affect.  Principal Problem: <principal problem not specified> Diagnosis: Active Problems:   Major depression  Total Time spent with patient: 20 minutes  Past Psychiatric History: Depression, Anxiety, ADHD. She had been on multiple medication in the past.She has been followed by Dr. Harrington Challenger in [place].  Past Medical History:  Past Medical History:  Diagnosis Date  . ADD (attention deficit disorder)   . Anemia 2011   2o to GASTRIC BYPASS  . Anginal pain ([place])   . Anxiety   . Blood transfusion without reported diagnosis   . Chronic  daily headache   . Depression   . Depression   . Dysmenorrhea 09/18/2013  . Dysrhythmia   . Elevated liver enzymes JUL 2011ALK PHOS 111-127 AST  143-267 ALT  213-321T BILI 0.6  ALB  3.7-4.06 Jun 2011 ALK PHOS 118 AST 24 ALT 42 T BILI 0.4 ALB 3.9  . Encounter for drug rehabilitation    at behavioral health for opioid addiction about 4 years ago  . Family history of adverse reaction to anesthesia    'dad had to be kept on pump for breathing for  morphine'  . Fatty liver   . Fibroid 01/18/2017  . Fibromyalgia   . Gastric bypass status for obesity   . Gastritis JULY 2011  . Heart murmur   . 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 History of Holter monitoring   . Hx of opioid abuse (HCC)    for about 4 years, about 4 years ago  . Interstitial cystitis   . Iron deficiency anemia 07/23/2010  . Irritable bowel syndrome 2012 DIARRHEA   JUN 2012 TTG IgA 14.9  . IUD FEB 2010  . Lupus (HCC)   . Menorrhagia 07/18/2013  . Migraines   . Obesity (BMI 30-39.9) 2011 228 LBS BMI 36.8  . Ovarian cyst   . Patient desires pregnancy 09/18/2013  . Polyneuropathy   . PONV (postoperative nausea and vomiting) seizure post-operatively   seizure following ankle surgery  . Potassium (K) deficiency   . Pregnant 12/25/2013  . Psychiatric pseudoseizure   . RLQ abdominal pain 07/18/2013  . Sciatica of left side 11/14/2014  . 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 Stress 09/18/2013    Past Surgical History:  Procedure Laterality Date  . BIOPSY  04/10/2015   Procedure: BIOPSY;  Surgeon: Corbin Ade, MD;  Location: [facility];  Service: Endoscopy;;  . cath self every nite     for sodium bicarb injection (discontinued 2013)  . CHOLECYSTECTOMY  2005   biliary dyskinesia  . COLONOSCOPY  JUN 2012 ABD PN/DIARRHEA WITH PROPOFOL   NL COLON  . DILATION AND CURETTAGE OF UTERUS    . DILITATION & CURRETTAGE/HYSTROSCOPY WITH NOVASURE ABLATION N/A 03/24/2017   Procedure: DILATATION & CURETTAGE/HYSTEROSCOPY WITH NOVASURE ENDOMETRIAL ABLATION;  Surgeon: Tilda Burrow, MD;  Location: [facility];  Service: Gynecology;  Laterality: N/A;  . ESOPHAGEAL DILATION N/A 04/10/2015   Procedure: ESOPHAGEAL DILATION WITH 54FR MALONEY DILATOR;  Surgeon: Corbin Ade, MD;  Location: [facility];  Service: Endoscopy;  Laterality: N/A;  . ESOPHAGOGASTRODUODENOSCOPY    . ESOPHAGOGASTRODUODENOSCOPY (EGD) WITH PROPOFOL N/A 04/10/2015   Procedure:  ESOPHAGOGASTRODUODENOSCOPY (EGD) WITH PROPOFOL;  Surgeon: Corbin Ade, MD;  Location: [facility];  Service: Endoscopy;  Laterality: N/A;  . ESOPHAGOGASTRODUODENOSCOPY (EGD) WITH PROPOFOL N/A 12/06/2016   Procedure: ESOPHAGOGASTRODUODENOSCOPY (EGD) WITH PROPOFOL;  Surgeon: West Bali, MD;  [facility];  Service: Endoscopy;  Laterality: N/A;  . ESOPHAGOGASTRODUODENOSCOPY (EGD) WITH PROPOFOL N/A 04/27/2019   Procedure: ESOPHAGOGASTRODUODENOSCOPY (EGD) WITH PROPOFOL;  Surgeon: Malissa Hippo, MD;  [facility];  Service: Endoscopy;  Laterality: N/A;  730  . ESOPHAGOGASTRODUODENOSCOPY (EGD) WITH PROPOFOL N/A 08/31/2019   Procedure: ESOPHAGOGASTRODUODENOSCOPY (EGD) WITH PROPOFOL;  Surgeon: Malissa Hippo, MD;  [facility];  Service: Endoscopy;  Laterality: N/A;  . GAB  2007   in [place]-POUCH 5 CM  . GASTRIC BYPASS  06/2006  . HYSTEROSCOPY WITH D & C N/A 09/12/2014   Procedure: DILATATION  AND CURETTAGE /HYSTEROSCOPY;  Surgeon: Jonnie Kind, MD;  Location: [facility];  Service: Gynecology;  Laterality: N/A;  . KYPHOPLASTY N/A 08/02/2018   Procedure: KYPHOPLASTY T12;  Surgeon: Melina Schools, MD;  Location: [place];  Service: Orthopedics;  Laterality: N/A;  60 mins  . LAPAROSCOPIC TUBAL LIGATION Bilateral 03/24/2017   Procedure: LAPAROSCOPIC TUBAL LIGATION ([place] Rings);  Surgeon: Jonnie Kind, MD;  Location: [facility];  Service: Gynecology;  Laterality: Bilateral;  . REPAIR VAGINAL CUFF N/A 07/30/2014   Procedure: REPAIR VAGINAL CUFF;  Surgeon: Mora Bellman, MD;  Location: [place] [facility];  Service: Gynecology;  Laterality: N/A;  . SAVORY DILATION  06/20/2012   Dr. Barnie Alderman gastritis/Ulcer in the mid jejunum. Empiric dilation.   . SURAL NERVE BX Left 02/25/2016   Procedure: LEFT SURAL NERVE BIOPSY;  Surgeon: Jovita Gamma, MD;  Location: [place] [facility];  Service: Neurosurgery;  Laterality: Left;  Left sural nerve biopsy  . TONSILLECTOMY    . TONSILLECTOMY AND  ADENOIDECTOMY    . UPPER GASTROINTESTINAL ENDOSCOPY  JULY 2011 NAUSEA-D125,V6, PH 25   Bx; GASTRITIS, POUCH-5 CM LONG  . WISDOM TOOTH EXTRACTION     Family History:  Family History  Problem Relation Age of Onset  . Hemochromatosis Maternal Grandmother   . Migraines Maternal Grandmother   . Cancer Maternal Grandmother   . Breast cancer Maternal Grandmother   . Hypertension Father   . Diabetes Father   . Coronary artery disease Father   . Migraines Paternal Grandmother   . Breast cancer Paternal Grandmother   . Cancer Mother        breast  . Hemochromatosis Mother   . Breast cancer Mother   . Depression Mother   . Anxiety disorder Mother   . Coronary artery disease Paternal Grandfather   . Anxiety disorder Brother   . Bipolar disorder Brother   . Healthy Daughter   . Healthy Son    Family Psychiatric  History: History of depression in several family members and at least one suicide of completion. Social History:  Social History   Substance and Sexual Activity  Alcohol Use Yes  . Alcohol/week: 0.0 standard drinks   Comment: 1-2 beers a couple times a month      Social History   Substance and Sexual Activity  Drug Use Not Currently  . Types: Marijuana   Comment: Previously opioid addiction went through rehab    Social History   Socioeconomic History  . Marital status: Divorced    Spouse name: Not on file  . Number of children: 2  . Years of education: some colle  . Highest education level: Not on file  Occupational History    Employer: NOT EMPLOYED  Tobacco Use  . Smoking status: Former Smoker    Packs/day: 0.25    Years: 6.00    Pack years: 1.50    Types: E-cigarettes    Start date: 05/2018  . Smokeless tobacco: Never Used  Vaping Use  . Vaping Use: Former  Substance and Sexual Activity  . Alcohol use: Yes    Alcohol/week: 0.0 standard drinks    Comment: 1-2 beers a couple times a month   . Drug use: Not Currently    Types: Marijuana    Comment:  Previously opioid addiction went through rehab  . Sexual activity: Yes    Partners: Male    Birth control/protection: Surgical, Condom    Comment: tubal and ablation  Other Topics Concern  . Not on file  Social History  Narrative   Patient is right handed.   Patient drinks one cup of coffee daily.   She lives in a one-story home with her two children (57 year old son, 19 month old daughter).   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 Difficulty of Paying Living Expenses:   Food Insecurity:   . Worried About Programme researcher, broadcasting/film/video in the Last Year:   . Barista in the Last Year:   Transportation Needs:   . Freight forwarder (Medical):   Marland Kitchen Lack of Transportation (Non-Medical):   Physical Activity:   . Days of Exercise per Week:   . Minutes of Exercise per Session:   Stress:   . Feeling of Stress :   Social Connections:   . Frequency of Communication with Friends and Family:   . Frequency of Social Gatherings with Friends and Family:   . Attends Religious Services:   . Active Member of Clubs or Organizations:   . Attends Banker Meetings:   Marland Kitchen Marital Status:    Additional Social History:                         Sleep: Good  Appetite:  Poor  Current Medications: Current Facility-Administered Medications  Medication Dose Route Frequency Provider Last Rate Last Admin  . acetaminophen (TYLENOL) tablet 650 mg  650 mg Oral Q6H PRN Antonieta Pert, MD      . albuterol (VENTOLIN HFA) 108 (90 Base) MCG/ACT inhaler 1-2 puff  1-2 puff Inhalation Q6H PRN Antonieta Pert, MD      . alum & mag hydroxide-simeth (MAALOX/MYLANTA) 200-200-20 MG/5ML suspension 30 mL  30 mL Oral Q4H PRN Antonieta Pert, MD      . baclofen (LIORESAL) tablet 5 mg  5 mg Oral TID PRN Antonieta Pert, MD   5 mg at 06/20/20 1844  . Buprenorphine HCl FILM 75 mcg   75 mcg Buccal BID Antonieta Pert, MD   75 mcg at 06/21/20 1012  . busPIRone (BUSPAR) tablet 5 mg  5 mg Oral TID Antonieta Pert, MD   5 mg at 06/21/20 1013  . clonazePAM (KLONOPIN) tablet 0.5 mg  0.5 mg Oral BID PRN Antonieta Pert, MD   0.5 mg at 06/21/20 1027  . divalproex (DEPAKOTE) DR tablet 250 mg  250 mg Oral Q12H Antonieta Pert, MD   250 mg at 06/21/20 1015  . DULoxetine (CYMBALTA) DR capsule 60 mg  60 mg Oral BID Antonieta Pert, MD   60 mg at 06/21/20 1015  . fluticasone (FLONASE) 50 MCG/ACT nasal spray 2 spray  2 spray Each Nare Daily Antonieta Pert, MD   2 spray at 06/21/20 1015  . fluticasone (FLOVENT HFA) 44 MCG/ACT inhaler 2 puff  2 puff Inhalation BID Antonieta Pert, MD   2 puff at 06/20/20 0949  . HYDROmorphone (DILAUDID) tablet 4 mg  4 mg Oral Q12H PRN Antonieta Pert, MD   4 mg at 06/21/20 1027  . ipratropium-albuterol (DUONEB) 0.5-2.5 (3) MG/3ML nebulizer solution 3 mL  3 mL Nebulization Q6H PRN Antonieta Pert, MD      . magnesium hydroxide (MILK OF MAGNESIA) suspension 30 mL  30 mL Oral Daily PRN Antonieta Pert, MD      .  olopatadine (PATANOL) 0.1 % ophthalmic solution 1 drop  1 drop Both Eyes BID Sharma Covert, MD   1 drop at 06/21/20 1018  . ondansetron (ZOFRAN-ODT) disintegrating tablet 8 mg  8 mg Oral Q8H PRN Sharma Covert, MD   8 mg at 06/20/20 1142  . pantoprazole (PROTONIX) EC tablet 40 mg  40 mg Oral BID Sharma Covert, MD   40 mg at 06/21/20 1017  . promethazine (PHENERGAN) tablet 25 mg  25 mg Oral Q6H PRN Sharma Covert, MD   25 mg at 06/20/20 1844  . topiramate (TOPAMAX) tablet 100 mg  100 mg Oral BID Sharma Covert, MD   100 mg at 06/21/20 1017  . traZODone (DESYREL) tablet 200 mg  200 mg Oral QHS Lindell Spar I, NP        Lab Results: No results found. However, due to the size of the patient record, not all encounters were searched. Please check Results Review for a complete set of results.  Blood Alcohol  level:  Lab Results  Component Value Date   ETH <10 06/17/2020   ETH <10 40/98/1191    Metabolic Disorder Labs: Lab Results  Component Value Date   HGBA1C 5.7 (H) 01/15/2015   Lab Results  Component Value Date   PROLACTIN 80.0 (H) 05/09/2015   Lab Results  Component Value Date   CHOL 95 03/10/2012   TRIG 50 03/10/2012   HDL 38 (L) 03/10/2012   CHOLHDL 2.5 03/10/2012   VLDL 10 03/10/2012   LDLCALC 47 03/10/2012    Physical Findings: AIMS: Facial and Oral Movements Muscles of Facial Expression: None, normal Lips and Perioral Area: None, normal Jaw: None, normal Tongue: None, normal,Extremity Movements Upper (arms, wrists, hands, fingers): None, normal Lower (legs, knees, ankles, toes): None, normal, Trunk Movements Neck, shoulders, hips: None, normal, Overall Severity Severity of abnormal movements (highest score from questions above): None, normal Incapacitation due to abnormal movements: None, normal Patient's awareness of abnormal movements (rate only patient's report): No Awareness, Dental Status Current problems with teeth and/or dentures?: No Does patient usually wear dentures?: No  CIWA:    COWS:     Musculoskeletal: Strength & Muscle Tone: within normal limits Gait & Station: unable to stand Patient leans: N/A  Psychiatric Specialty Exam: Physical Exam  Review of Systems  Blood pressure 97/70, pulse 71, temperature 98.5 F (36.9 C), temperature source Oral, resp. rate 16, SpO2 100 %.There is no height or weight on file to calculate BMI.  General Appearance: Disheveled  Eye Contact:  Fair  Speech:  Normal Rate  Volume:  Normal  Mood:  Anxious and Depressed  Affect:  Labile  Thought Process:  Coherent and Descriptions of Associations: Circumstantial  Orientation:  Full (Time, Place, and Person)  Thought Content:  Logical  Suicidal Thoughts:  No  Homicidal Thoughts:  No  Memory:  Immediate;   Fair Recent;   Fair Remote;   Fair  Judgement:   Impaired  Insight:  Lacking  Psychomotor Activity:  Decreased  Concentration:  Concentration: Fair and Attention Span: Fair  Recall:  [employer] of Knowledge:  Fair  Language:  Fair  Akathisia:  Negative  Handed:  Right  AIMS (if indicated):     Assets:  Desire for Improvement Housing Social Support  ADL's:  Intact  Cognition:  WNL  Sleep:  Number of Hours: 5.75     Treatment Plan Summary: Pt is admitted with above mentioned psychiatric history. Pt is seen and  examined today. Currently, Pt denies any suicidal ideation and homicidal ideation. Pt denies visual, tactile and auditory hallucinations. Pt states she is feeling little better. Pt endorses low, depressed mood and anhedonia. Pt reports low appetite states she feels nauseous sometimes. Pt states she is stressing out about what's going at home and about the what medications she is taking here. Pt states "I can't look up on Internet what you have been giving me and that stresses me out". Pt states she doesn't want to ask for help from her parents as she thinks she should take care of her kids by herself. Pt denies any visual, auditory and tactile hallucinations. Pt denies paranoia. Pt reports fatigue and low energy. Pt endorses racing thoughts and is worrying about his home situation. Pt states she slept well last night but gets bored during day time. Pt states she went to day room and socialize with other patients. Pt reports back pain and demanded her home medications.  On examination, Pt is lying in the bed as she states she has pain and don't want to stand. Pt seems very labile, tearful, anxious with depressed mood and labile affect.   Labs- Urine Preg Test- Negative, Blood Alcohol<10, K-3.1, Glucose- 114, Urine- Ca oxalate Crystals positive. Urine toxicology- positive for Benzo and THC  Plan- -Continue Cymbalta 60 mg BID, Depakote 250 mg Q12H -Continue Dilaudid, Beclofen, Clonazepam, Buprenorphine, Buspar, Flonase, Flovent spray,  Duoned nebulizer, zofran, Patanol Eye drops, Protonix, Phenergen, Topamax, Trazodone -Add KLOR 23mg BID -Monitor vitals -Monitor for any side effects of medications. -Monitor for suicidal ideations. -Monitor for any withdrawal symptoms.   Daily contact with patient to assess and evaluate symptoms and progress in treatment  VArmando Reichert MD 06/21/2020, 12:41 PM   Case discussed and plan agreed upon as outlined above but will increase Depakote DR to 500 mg p.o. every 12 hours for mood stability.

## 2020-06-21 NOTE — [facility] Group Notes (Signed)
[place] LCSW Group Therapy Note  Date/Time:    06/21/2020 10:00-11:00AM  Type of Therapy and Topic:  Group Therapy:  Healthy vs Unhealthy Coping Skills  Participation Level:  Did Not Attend   Description of Group:  The focus of this group was to determine what unhealthy coping techniques typically are used by group members and what healthy coping techniques would be helpful in coping with various problems. Patients were guided in becoming aware of the differences between healthy and unhealthy coping techniques.  Patients were asked to identify 1-2 healthy coping skills they would like to learn to use more effectively, and many mentioned meditation, breathing, and relaxation.  At the end of group, additional ideas of healthy coping skills were shared in a fun exercise.  Therapeutic Goals Patients learned that coping is what human beings do all day long to deal with various situations in their lives Patients defined and discussed healthy vs unhealthy coping techniques Patients identified their preferred coping techniques and identified whether these were healthy or unhealthy Patients determined 1-2 healthy coping skills they would like to become more familiar with and use more often Patients provided support and ideas to each other  Summary of Patient Progress: N/A   Therapeutic Modalities Cognitive Behavioral Therapy Motivational Interviewing   Selmer Dominion, LCSW 06/21/2020, 9:22 AM

## 2020-06-22 DIAGNOSIS — F339 Major depressive disorder, recurrent, unspecified: Secondary | ICD-10-CM

## 2020-06-22 DIAGNOSIS — F332 Major depressive disorder, recurrent severe without psychotic features: Secondary | ICD-10-CM | POA: Diagnosis not present

## 2020-06-22 NOTE — Progress Notes (Signed)
Progress note  Pt is requesting social work to discuss their discharge plan. Pt is interested in IOP and states they are anxious regarding whether their insurance will cover this.

## 2020-06-22 NOTE — Progress Notes (Signed)
[place] Group Notes:  (Nursing/MHT/Case Management/Adjunct)  Date:  06/22/2020  Time:  2000  Type of Therapy:  wrap up group  Participation Level:  Active  Participation Quality:  Appropriate, Attentive, Sharing and Supportive  Affect:  Appropriate and Irritable  Cognitive:  Appropriate  Insight:  Improving  Engagement in Group:  Engaged  Modes of Intervention:  Clarification, Education and Support  Summary of Progress/Problems: Positive thinking and self-care were discussed.   Penny [name] 06/22/2020, 9:50 PM

## 2020-06-22 NOTE — Progress Notes (Signed)
   06/22/20 0024  Psych Admission Type (Psych Patients Only)  Admission Status Voluntary  Psychosocial Assessment  Patient Complaints Depression  Eye Contact Fair  Facial Expression Flat  Affect Appropriate to circumstance  Speech Logical/coherent  Interaction Assertive  Motor Activity Slow  Appearance/Hygiene Unremarkable  Behavior Characteristics Appropriate to situation  Mood Depressed  Thought Process  Coherency WDL  Content WDL  Delusions None reported or observed  Perception WDL  Hallucination None reported or observed  Judgment Impaired  Confusion None  Danger to Self  Current suicidal ideation? Denies  Danger to Others  Danger to Others None reported or observed

## 2020-06-22 NOTE — [facility] Group Notes (Signed)
[place] LCSW Group Therapy Note  Date/Time:  06/22/2020 9:00-10:00 or 10:00-11:00AM  Type of Therapy and Topic:  Group Therapy:  Healthy and Unhealthy Supports  Participation Level:  Did Not Attend   Description of Group:  Patients in this group were introduced to the idea of adding a variety of healthy supports to address the various needs in their lives.Patients discussed what additional healthy supports could be helpful in their recovery and wellness after discharge in order to prevent future hospitalizations.   An emphasis was placed on using counselor, doctor, therapy groups, 12-step groups, and problem-specific support groups to expand supports.  Several songs were played to emphasize points made throughout group.  Therapeutic Goals:   1)  discuss importance of adding supports to stay well once out of the hospital  2)  compare healthy versus unhealthy supports and identify some examples of each  3)  generate ideas and descriptions of healthy supports that can be added  4)  offer mutual support about how to address unhealthy supports  5)  encourage active participation in and adherence to discharge plan    Summary of Patient Progress: N/A   Therapeutic Modalities:   [place], LCSW

## 2020-06-22 NOTE — Progress Notes (Signed)
[place] NOVEL CORONAVIRUS (COVID-19) DAILY CHECK-OFF SYMPTOMS - answer yes or no to each - every day NO YES  Have you had a fever in the past 24 hours?  . Fever (Temp > 37.80C / 100F) X   Have you had any of these symptoms in the past 24 hours? . New Cough .  Sore Throat  .  Shortness of Breath .  Difficulty Breathing .  Unexplained Body Aches   X   Have you had any one of these symptoms in the past 24 hours not related to allergies?   . Runny Nose .  Nasal Congestion .  Sneezing   X   If you have had runny nose, nasal congestion, sneezing in the past 24 hours, has it worsened?  X   EXPOSURES - check yes or no X   Have you traveled outside the state in the past 14 days?  X   Have you been in contact with someone with a confirmed diagnosis of COVID-19 or PUI in the past 14 days without wearing appropriate PPE?  X   Have you been living in the same home as a person with confirmed diagnosis of COVID-19 or a PUI (household contact)?    X   Have you been diagnosed with COVID-19?    X              What to do next: Answered NO to all: Answered YES to anything:   Proceed with unit schedule Follow the [facility] Inpatient Flowsheet.   Jkwon Treptow K. Tyashia Morrisette MSN, RN, [facility] [phone] 

## 2020-06-22 NOTE — Progress Notes (Signed)
  Pt is caucasian female  of 34 y.o. , DOB 1986-10-11, MRN  [id number]  presents Voluntary with Depression,  Hx of anxiety   Patient   reports fair appetite, energy low, and poor sleeping pattern. Pt rates  depression 9 out of 10, hopelessness 5 out of 10, and anxiety at 9 out of 10. Pt denies SI, HI, or AVH.  Pt reports LBM yesterday, Generalized pain  increasing from 0/10 at 8 am to 6/10 at 11 am.  Vitals signs  being monitored. Medication given as Rx, pt reports little relief, safety maintained with q15 minute checks.    Lolly Mustache. Bobby Rumpf MSN, [place], [facility] [phone]

## 2020-06-22 NOTE — Progress Notes (Signed)
[facility] MD Progress Note  06/22/2020 3:46 PM Penny [name]  MRN:  [id number]  Subjective: Penny [name] reports, "I'm feeling supper anxious right now because I miss my children. I just want to talk to them but the phone is off till after group sessions. My mood is good & continues to improve".  Objective: Patient is seen and examined. Patient is a 34 year old female with a past psychiatric history significant for depression, generalized anxiety disorder and attention deficit disorder who presented to the [facility] emergency department on 06/17/2020 with suicidal ideation. The patient originally presented there for profuse watery diarrhea for 5 days, and had not been eating or drinking well. She also stated that she had been extremely depressed and had not been getting out of bed fro the 5 days prior to admission. She told the emergency room physicians that she did not feel safe with herself at home. There is some information in the chart that stated that her father had told her that he would take her child, and that she needed to get help or they would not allow her to see the child. Penny [name] is seen, chart reviewed. The chart findings discussed with the treatment team. She presents alert, oriented & aware of situation. She is visible on the unit, attending group sessions. She reports improved mood, however, says she is feeling super anxious today because she wanted to talk to her children, but the phone was off till after group sessions. Patient is instructed & encouraged to take advantage of the group session that is being held on the unit to learn coping skills that should help her after discharge to cope better. She is informed that she can use the phone when it is turned back on. She says her pain episodes are stabilizing. She is sleeping well. Her appetite has also improved. She is taking & tolerating her treatment regimen. Denies any side effects. She denies any SIHI, AVH, delusional thoughts or paranoia. Penny [name]  does not appear to be responding to any internal stimuli.  Principal Problem: Major depressive disorder, recurrent episode ([place])  Diagnosis: Principal Problem:   Major depressive disorder, recurrent episode ([place]) Active Problems:   Major depression  Total Time spent with patient: 15 minutes  Past Psychiatric History: Depression, Anxiety, ADHD. She had been on multiple medication in the past.She has been followed by Dr. Harrington Challenger in [place].  Past Medical History:  Past Medical History:  Diagnosis Date  . ADD (attention deficit disorder)   . Anemia 2011   2o to GASTRIC BYPASS  . Anginal pain ([place])   . Anxiety   . Blood transfusion without reported diagnosis   . Chronic daily headache   . Depression   . Depression   . Dysmenorrhea 09/18/2013  . Dysrhythmia   . Elevated liver enzymes JUL 2011ALK PHOS 111-127 AST  143-267 ALT  213-321T BILI 0.6  ALB  3.7-4.06 Jun 2011 ALK PHOS 118 AST 24 ALT 42 T BILI 0.4 ALB 3.9  . Encounter for drug rehabilitation    at behavioral health for opioid addiction about 4 years ago  . Family history of adverse reaction to anesthesia    'dad had to be kept on pump for breathing for morphine'  . Fatty liver   . Fibroid 01/18/2017  . Fibromyalgia   . Gastric bypass status for obesity   . Gastritis JULY 2011  . Heart murmur   . 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 History of Holter monitoring   .  Hx of opioid abuse (HCC)    for about 4 years, about 4 years ago  . Interstitial cystitis   . Iron deficiency anemia 07/23/2010  . Irritable bowel syndrome 2012 DIARRHEA   JUN 2012 TTG IgA 14.9  . IUD FEB 2010  . Lupus (HCC)   . Menorrhagia 07/18/2013  . Migraines   . Obesity (BMI 30-39.9) 2011 228 LBS BMI 36.8  . Ovarian cyst   . Patient desires pregnancy 09/18/2013  . Polyneuropathy   . PONV (postoperative nausea and vomiting) seizure post-operatively   seizure following ankle surgery  . Potassium (K) deficiency   .  Pregnant 12/25/2013  . Psychiatric pseudoseizure   . RLQ abdominal pain 07/18/2013  . Sciatica of left side 11/14/2014  . 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 Stress 09/18/2013    Past Surgical History:  Procedure Laterality Date  . BIOPSY  04/10/2015   Procedure: BIOPSY;  Surgeon: Corbin Ade, MD;  Location: [facility];  Service: Endoscopy;;  . cath self every nite     for sodium bicarb injection (discontinued 2013)  . CHOLECYSTECTOMY  2005   biliary dyskinesia  . COLONOSCOPY  JUN 2012 ABD PN/DIARRHEA WITH PROPOFOL   NL COLON  . DILATION AND CURETTAGE OF UTERUS    . DILITATION & CURRETTAGE/HYSTROSCOPY WITH NOVASURE ABLATION N/A 03/24/2017   Procedure: DILATATION & CURETTAGE/HYSTEROSCOPY WITH NOVASURE ENDOMETRIAL ABLATION;  Surgeon: Tilda Burrow, MD;  Location: [facility] OR[facility];  Service: Gynecology;  Laterality: N/A;  . ESOPHAGEAL DILATION N/A 04/10/2015   Procedure: ESOPHAGEAL DILATION WITH 54FR MALONEY DILATOR;  Surgeon: Corbin Ade, MD;  Location: [facility];  Service: Endoscopy;  Laterality: N/A;  . ESOPHAGOGASTRODUODENOSCOPY    . ESOPHAGOGASTRODUODENOSCOPY (EGD) WITH PROPOFOL N/A 04/10/2015   Procedure: ESOPHAGOGASTRODUODENOSCOPY (EGD) WITH PROPOFOL;  Surgeon: Corbin Ade, MD;  Location: [facility];  Service: Endoscopy;  Laterality: N/A;  . ESOPHAGOGASTRODUODENOSCOPY (EGD) WITH PROPOFOL N/A 12/06/2016   Procedure: ESOPHAGOGASTRODUODENOSCOPY (EGD) WITH PROPOFOL;  Surgeon: West Bali, MD;  [facility]: [facility];  Service: Endoscopy;  Laterality: N/A;  . ESOPHAGOGASTRODUODENOSCOPY (EGD) WITH PROPOFOL N/A 04/27/2019   Procedure: ESOPHAGOGASTRODUODENOSCOPY (EGD) WITH PROPOFOL;  Surgeon: Malissa Hippo, MD;  [facility];  Service: Endoscopy;  Laterality: N/A;  730  . ESOPHAGOGASTRODUODENOSCOPY (EGD) WITH PROPOFOL N/A 08/31/2019   Procedure: ESOPHAGOGASTRODUODENOSCOPY (EGD) WITH PROPOFOL;  Surgeon: Malissa Hippo, MD;  [facility];  Service: Endoscopy;  Laterality:  N/A;  . GAB  2007   in [place]-POUCH 5 CM  . GASTRIC BYPASS  06/2006  . HYSTEROSCOPY WITH D & C N/A 09/12/2014   Procedure: DILATATION AND CURETTAGE /HYSTEROSCOPY;  Surgeon: Tilda Burrow, MD;  Location: [facility];  Service: Gynecology;  Laterality: N/A;  . KYPHOPLASTY N/A 08/02/2018   Procedure: KYPHOPLASTY T12;  Surgeon: Venita Lick, MD;  Location: [facility] OR;  Service: Orthopedics;  Laterality: N/A;  60 mins  . LAPAROSCOPIC TUBAL LIGATION Bilateral 03/24/2017   Procedure: LAPAROSCOPIC TUBAL LIGATION ([place] Rings);  Surgeon: Tilda Burrow, MD;  Location: [facility];  Service: Gynecology;  Laterality: Bilateral;  . REPAIR VAGINAL CUFF N/A 07/30/2014   Procedure: REPAIR VAGINAL CUFF;  Surgeon: Catalina Antigua, MD;  Location: [facility];  Service: Gynecology;  Laterality: N/A;  . SAVORY DILATION  06/20/2012   Dr. Cyndi Bender gastritis/Ulcer in the mid jejunum. Empiric dilation.   . SURAL NERVE BX Left 02/25/2016   Procedure: LEFT SURAL NERVE BIOPSY;  Surgeon: Shirlean Kelly, MD;  Location: [facility] NEURO  ORS;  Service: Neurosurgery;  Laterality: Left;  Left sural nerve biopsy  . TONSILLECTOMY    . TONSILLECTOMY AND ADENOIDECTOMY    . UPPER GASTROINTESTINAL ENDOSCOPY  JULY 2011 NAUSEA-D125,V6, PH 25   Bx; GASTRITIS, POUCH-5 CM LONG  . WISDOM TOOTH EXTRACTION     Family History:  Family History  Problem Relation Age of Onset  . Hemochromatosis Maternal Grandmother   . Migraines Maternal Grandmother   . Cancer Maternal Grandmother   . Breast cancer Maternal Grandmother   . Hypertension Father   . Diabetes Father   . Coronary artery disease Father   . Migraines Paternal Grandmother   . Breast cancer Paternal Grandmother   . Cancer Mother        breast  . Hemochromatosis Mother   . Breast cancer Mother   . Depression Mother   . Anxiety disorder Mother   . Coronary artery disease Paternal Grandfather   . Anxiety disorder Brother   . Bipolar disorder Brother   . Healthy Daughter   . Healthy Son     Family Psychiatric  History: Major depression in several family members and at least one suicide of completion.  Social History:  Social History   Substance and Sexual Activity  Alcohol Use Yes  . Alcohol/week: 0.0 standard drinks   Comment: 1-2 beers a couple times a month      Social History   Substance and Sexual Activity  Drug Use Not Currently  . Types: Marijuana   Comment: Previously opioid addiction went through rehab    Social History   Socioeconomic History  . Marital status: Divorced    Spouse name: Not on file  . Number of children: 2  . Years of education: some colle  . Highest education level: Not on file  Occupational History    Employer: NOT EMPLOYED  Tobacco Use  . Smoking status: Former Smoker    Packs/day: 0.25    Years: 6.00    Pack years: 1.50    Types: E-cigarettes    Start date: 05/2018  . Smokeless tobacco: Never Used  Vaping Use  . Vaping Use: Former  Substance and Sexual Activity  . Alcohol use: Yes    Alcohol/week: 0.0 standard drinks    Comment: 1-2 beers a couple times a month   . Drug use: Not Currently    Types: Marijuana    Comment: Previously opioid addiction went through rehab  . Sexual activity: Yes    Partners: Male    Birth control/protection: Surgical, Condom    Comment: tubal and ablation  Other Topics Concern  . Not on file  Social History Narrative   Patient is right handed.   Patient drinks one cup of coffee daily.   She lives in a one-story home with her two children (48 year old son, 13 month old daughter).   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 Difficulty of Paying Living Expenses:   Food Insecurity:   . Worried About Charity fundraiser in the Last Year:   . Arboriculturist in the Last Year:   Transportation Needs:   . Film/video editor (Medical):   Marland Kitchen Lack of Transportation  (Non-Medical):   Physical Activity:   . Days of Exercise per Week:   . Minutes of Exercise per Session:   Stress:   .  Feeling of Stress :   Social Connections:   . Frequency of Communication with Friends and Family:   . Frequency of Social Gatherings with Friends and Family:   . Attends Religious Services:   . Active Member of Clubs or Organizations:   . Attends Archivist Meetings:   Marland Kitchen Marital Status:    Additional Social History:   Sleep: Good  Appetite:  Poor  Current Medications: Current Facility-Administered Medications  Medication Dose Route Frequency Provider Last Rate Last Admin  . acetaminophen (TYLENOL) tablet 650 mg  650 mg Oral Q6H PRN Sharma Covert, MD      . albuterol (VENTOLIN HFA) 108 (90 Base) MCG/ACT inhaler 1-2 puff  1-2 puff Inhalation Q6H PRN Sharma Covert, MD      . alum & mag hydroxide-simeth (MAALOX/MYLANTA) 200-200-20 MG/5ML suspension 30 mL  30 mL Oral Q4H PRN Sharma Covert, MD      . baclofen (LIORESAL) tablet 5 mg  5 mg Oral TID PRN Sharma Covert, MD   5 mg at 06/22/20 0900  . Buprenorphine HCl FILM 75 mcg  75 mcg Buccal BID Sharma Covert, MD   75 mcg at 06/22/20 0900  . busPIRone (BUSPAR) tablet 5 mg  5 mg Oral TID Sharma Covert, MD   5 mg at 06/22/20 1205  . clonazePAM (KLONOPIN) tablet 0.5 mg  0.5 mg Oral BID PRN Sharma Covert, MD   0.5 mg at 06/22/20 1205  . divalproex (DEPAKOTE) DR tablet 500 mg  500 mg Oral Q12H Sharma Covert, MD   500 mg at 06/22/20 0816  . DULoxetine (CYMBALTA) DR capsule 60 mg  60 mg Oral BID Sharma Covert, MD   60 mg at 06/22/20 0900  . fluticasone (FLONASE) 50 MCG/ACT nasal spray 2 spray  2 spray Each Nare Daily Sharma Covert, MD   2 spray at 06/22/20 0900  . fluticasone (FLOVENT HFA) 44 MCG/ACT inhaler 2 puff  2 puff Inhalation BID Sharma Covert, MD   2 puff at 06/22/20 0900  . HYDROmorphone (DILAUDID) tablet 4 mg  4 mg Oral Q12H PRN Sharma Covert, MD   4 mg  at 06/22/20 1206  . ipratropium-albuterol (DUONEB) 0.5-2.5 (3) MG/3ML nebulizer solution 3 mL  3 mL Nebulization Q6H PRN Sharma Covert, MD      . magnesium hydroxide (MILK OF MAGNESIA) suspension 30 mL  30 mL Oral Daily PRN Sharma Covert, MD      . olopatadine (PATANOL) 0.1 % ophthalmic solution 1 drop  1 drop Both Eyes BID Sharma Covert, MD   1 drop at 06/22/20 0900  . ondansetron (ZOFRAN-ODT) disintegrating tablet 8 mg  8 mg Oral Q8H PRN Sharma Covert, MD   8 mg at 06/21/20 1807  . pantoprazole (PROTONIX) EC tablet 40 mg  40 mg Oral BID Sharma Covert, MD   40 mg at 06/22/20 0816  . potassium chloride SA (KLOR-CON) CR tablet 20 mEq  20 mEq Oral BID Armando Reichert, MD   20 mEq at 06/22/20 0816  . promethazine (PHENERGAN) tablet 25 mg  25 mg Oral Q6H PRN Sharma Covert, MD   25 mg at 06/20/20 1844  . topiramate (TOPAMAX) tablet 100 mg  100 mg Oral BID Sharma Covert, MD   100 mg at 06/22/20 0816  . traZODone (DESYREL) tablet 200 mg  200 mg Oral QHS Lindell Spar I, NP   200 mg at 06/21/20 2317  Lab Results: No results found. However, due to the size of the patient record, not all encounters were searched. Please check Results Review for a complete set of results.  Blood Alcohol level:  Lab Results  Component Value Date   ETH <10 06/17/2020   ETH <10 16/38/4665   Metabolic Disorder Labs: Lab Results  Component Value Date   HGBA1C 5.7 (H) 01/15/2015   Lab Results  Component Value Date   PROLACTIN 80.0 (H) 05/09/2015   Lab Results  Component Value Date   CHOL 95 03/10/2012   TRIG 50 03/10/2012   HDL 38 (L) 03/10/2012   CHOLHDL 2.5 03/10/2012   VLDL 10 03/10/2012   LDLCALC 47 03/10/2012   Physical Findings: AIMS: Facial and Oral Movements Muscles of Facial Expression: None, normal Lips and Perioral Area: None, normal Jaw: None, normal Tongue: None, normal,Extremity Movements Upper (arms, wrists, hands, fingers): None, normal Lower (legs, knees,  ankles, toes): None, normal, Trunk Movements Neck, shoulders, hips: None, normal, Overall Severity Severity of abnormal movements (highest score from questions above): None, normal Incapacitation due to abnormal movements: None, normal Patient's awareness of abnormal movements (rate only patient's report): No Awareness, Dental Status Current problems with teeth and/or dentures?: No Does patient usually wear dentures?: No  CIWA:    COWS:     Musculoskeletal: Strength & Muscle Tone: within normal limits Gait & Station: unable to stand Patient leans: N/A  Psychiatric Specialty Exam: Physical Exam Vitals and nursing note reviewed.  HENT:     Head: Normocephalic.     Nose: Nose normal.     Mouth/Throat:     Pharynx: Oropharynx is clear.  Eyes:     Pupils: Pupils are equal, round, and reactive to light.  Cardiovascular:     Rate and Rhythm: Normal rate.     Pulses: Normal pulses.  Pulmonary:     Effort: Pulmonary effort is normal.  Genitourinary:    Comments: Deferred Musculoskeletal:        General: Normal range of motion.     Cervical back: Normal range of motion.  Skin:    General: Skin is warm and dry.  Neurological:     Mental Status: She is alert and oriented to person, place, and time.     Review of Systems  Constitutional: Negative for chills, diaphoresis and fever.  HENT: Negative for congestion, rhinorrhea, sneezing and tinnitus.   Eyes: Negative for discharge.  Respiratory: Negative for cough, chest tightness, shortness of breath and wheezing.   Cardiovascular: Negative for chest pain and palpitations.  Gastrointestinal: Negative for diarrhea, nausea and vomiting.  Endocrine: Negative for cold intolerance.  Genitourinary: Negative for difficulty urinating.  Musculoskeletal: Positive for arthralgias, back pain and myalgias.  Allergic/Immunologic:       Allergies: Multiple medication allergies.  Neurological: Negative for dizziness, tremors, seizures, syncope,  speech difficulty, light-headedness, numbness and headaches.  Psychiatric/Behavioral: Positive for dysphoric mood ("Improving"). Negative for agitation, behavioral problems, confusion, decreased concentration, hallucinations, self-injury, sleep disturbance and suicidal ideas. The patient is nervous/anxious ("Improving"). The patient is not hyperactive.     Blood pressure (!) 105/56, pulse 60, temperature 98.5 F (36.9 C), temperature source Oral, resp. rate 16, SpO2 100 %.There is no height or weight on file to calculate BMI.  General Appearance: Disheveled  Eye Contact:  Fair  Speech:  Normal Rate  Volume:  Normal  Mood:  "Improving"  Affect:  Flat  Thought Process:  Coherent and Descriptions of Associations: Intact  Orientation:  Full (Time, Place,  and Person)  Thought Content:  Logical  Suicidal Thoughts:  No  Homicidal Thoughts:  No  Memory:  Immediate;   Fair Recent;   Fair Remote;   Fair  Judgement:  Impaired  Insight:  Lacking  Psychomotor Activity:  Decreased  Concentration:  Concentration: Fair and Attention Span: Fair  Recall:  [employer] of Knowledge:  Fair  Language:  Fair  Akathisia:  Negative  Handed:  Right  AIMS (if indicated):     Assets:  Desire for Improvement Housing Social Support  ADL's:  Intact  Cognition:  WNL  Sleep:  Number of Hours: 4.25   Daily contact with patient to assess and evaluate symptoms and progress in treatment and Medication management.  - Continue inpatient hospitalization. - Will continue today 06/22/2020 plan as below except where it is noted.  Treatment Plan Summary: Pt is admitted with above mentioned psychiatric history. Pt is seen and examined today. Currently, Pt denies any suicidal ideation or homicidal ideations. Pt denies any visual, tactile or auditory hallucinations. Pt states she is feeling a lot better, just missing her children a lot today. Says they are in good hands with her mother. Pt reports improved appetite. Pt  denies paranoia. Pt states she slept well last night but gets bored during day time because there are not much activities to keep her busy here. She is encouraged to attend group sessions to occupy her time while learning coping skills. She is visible. No behavioral issues reported by staff. Labs- Urine Preg Test- Negative, Blood Alcohol<10, K-3.1, Glucose- 114, Urine- Ca oxalate Crystals positive. Urine toxicology- positive for Benzo and THC  Plan- -Continue Cymbalta 60 mg bid for depression. -Depakote 250 mg po Q12H for mood stabilization -Continue Dilaudid 4 mg po Q 12 hrs prn for chronic pain. -Continue Beclofen 5 mg po tid prn for pain. -Continue Clonazepam 0.5 mg po bid prn for anxiety -Continue Buprenorphine 75 mcg buccally bid for pain. -Continue Buspar 5 po tid for anxiety -Continue Flonase 2 sprays per nare daily for allergies. -Continue Flovent inhaler, 2 puffs bid for SOB. -Continue  Duoned nebulizer 3 ml Q 6hrs prn for SOB/wheezing. -Continue Zofran 8 mg po Q 8 hrs prn for n/v. -Continue Patanol Eye drops, 1gtt both eye bid for irritation. -Continue Protonix 40 mg po Q am for GERD. -Continue Phenergan 25 mg po Q 6 hrs prn for N/V. -Continue Topamax 100 mg po bid for mood stabilization. -Continue Trazodone 200 mg po Q hs for insomnia. -Continue KLOR 101mg po BID for K replacement.  -Monitor vitals -Monitor for any side effects of medications. -Monitor for suicidal ideations. -Monitor for any withdrawal symptoms. -Encourage group participation. -Discharge disposition in progress.  ALindell Spar NP, PMHNP, FNP-BC 06/22/2020, 3:46 PM  Patient ID: EJasimine Simms female   DOB: 1Jan 22, 1987 34y.o.   MRN: [id number]

## 2020-06-22 NOTE — Progress Notes (Signed)
   06/22/20 0021  COVID-19 Daily Checkoff  Have you had a fever (temp > 37.80C/100F)  in the past 24 hours?  No  If you have had runny nose, nasal congestion, sneezing in the past 24 hours, has it worsened? No  COVID-19 EXPOSURE  Have you traveled outside the state in the past 14 days? No  Have you been in contact with someone with a confirmed diagnosis of COVID-19 or PUI in the past 14 days without wearing appropriate PPE? No  Have you been living in the same home as a person with confirmed diagnosis of COVID-19 or a PUI (household contact)? No  Have you been diagnosed with COVID-19? No

## 2020-06-22 NOTE — Progress Notes (Signed)
Pt has been visible in the milieu. Pt stated she has been talking her peer, this has been keeping her from going to the dark place in her mind. DAR  Denies auditory and visual hallucinations.  Maintained on routine safety checks.  Medications given as prescribed.  Support and encouragement offered as needed.  Will continue to monitor.

## 2020-06-23 ENCOUNTER — Telehealth: Payer: Self-pay | Admitting: *Deleted

## 2020-06-23 LAB — CBC WITH DIFFERENTIAL/PLATELET
Abs Immature Granulocytes: 0.01 10*3/uL (ref 0.00–0.07)
Basophils Absolute: 0 10*3/uL (ref 0.0–0.1)
Basophils Relative: 0 %
Eosinophils Absolute: 0.3 10*3/uL (ref 0.0–0.5)
Eosinophils Relative: 5 %
HCT: 35.1 % — ABNORMAL LOW (ref 36.0–46.0)
Hemoglobin: 11.6 g/dL — ABNORMAL LOW (ref 12.0–15.0)
Immature Granulocytes: 0 %
Lymphocytes Relative: 51 %
Lymphs Abs: 3 10*3/uL (ref 0.7–4.0)
MCH: 31.6 pg (ref 26.0–34.0)
MCHC: 33 g/dL (ref 30.0–36.0)
MCV: 95.6 fL (ref 80.0–100.0)
Monocytes Absolute: 0.5 10*3/uL (ref 0.1–1.0)
Monocytes Relative: 8 %
Neutro Abs: 2.2 10*3/uL (ref 1.7–7.7)
Neutrophils Relative %: 36 %
Platelets: 176 10*3/uL (ref 150–400)
RBC: 3.67 MIL/uL — ABNORMAL LOW (ref 3.87–5.11)
RDW: 12.2 % (ref 11.5–15.5)
WBC: 5.9 10*3/uL (ref 4.0–10.5)
nRBC: 0 % (ref 0.0–0.2)

## 2020-06-23 LAB — HEPATIC FUNCTION PANEL
ALT: 39 U/L (ref 0–44)
AST: 38 U/L (ref 15–41)
Albumin: 3.8 g/dL (ref 3.5–5.0)
Alkaline Phosphatase: 50 U/L (ref 38–126)
Bilirubin, Direct: 0.1 mg/dL (ref 0.0–0.2)
Indirect Bilirubin: 0.4 mg/dL (ref 0.3–0.9)
Total Bilirubin: 0.5 mg/dL (ref 0.3–1.2)
Total Protein: 6.3 g/dL — ABNORMAL LOW (ref 6.5–8.1)

## 2020-06-23 LAB — VALPROIC ACID LEVEL: Valproic Acid Lvl: 66 ug/mL (ref 50.0–100.0)

## 2020-06-23 MED ORDER — DIVALPROEX SODIUM 500 MG PO DR TAB: 500.0000 mg | DELAYED_RELEASE_TABLET | Freq: Two times a day (BID) | ORAL | 0 refills | Status: DC

## 2020-06-23 MED ORDER — BACLOFEN 5 MG PO TABS: 5.0000 mg | ORAL_TABLET | Freq: Three times a day (TID) | ORAL | 0 refills | Status: DC | PRN

## 2020-06-23 MED ORDER — HYDROMORPHONE HCL 2 MG PO TABS: 4.0000 mg | ORAL_TABLET | Freq: Two times a day (BID) | ORAL | 0 refills | Status: DC | PRN

## 2020-06-23 NOTE — Telephone Encounter (Signed)
Patient appeared on [facility] Managed Care Transitional Care Management follow up list. Patient remains hospitalized. Will follow up after hospital discharge.   Lenor Coffin, RN, BSN, [name] Patient Penny [name] [phone]

## 2020-06-23 NOTE — BHH Suicide Risk Assessment (Signed)
[facility] Discharge Suicide Risk Assessment   Principal Problem: Major depressive disorder, recurrent episode ([place]) Discharge Diagnoses: Principal Problem:   Major depressive disorder, recurrent episode ([place]) Active Problems:   Major depression   Total Time spent with patient: 15 minutes  Musculoskeletal: Strength & Muscle Tone: decreased Gait & Station: normal Patient leans: N/A  Psychiatric Specialty Exam: Review of Systems  Musculoskeletal: Positive for arthralgias, back pain, gait problem and myalgias.  Neurological: Positive for seizures and weakness.  Psychiatric/Behavioral: Positive for decreased concentration.  All other systems reviewed and are negative.   Blood pressure 110/73, pulse 85, temperature 98 F (36.7 C), temperature source Oral, resp. rate 16, SpO2 100 %.There is no height or weight on file to calculate BMI.  General Appearance: Casual  Eye Contact::  Fair  Speech:  Normal Rate409  Volume:  Normal  Mood:  Euthymic  Affect:  Congruent  Thought Process:  Coherent and Descriptions of Associations: Intact  Orientation:  Full (Time, Place, and Person)  Thought Content:  Logical  Suicidal Thoughts:  No  Homicidal Thoughts:  No  Memory:  Immediate;   Fair Recent;   Fair Remote;   Good  Judgement:  Intact  Insight:  Lacking  Psychomotor Activity:  Decreased  Concentration:  Fair  Recall:  [place] of Knowledge:Good  Language: Good  Akathisia:  Negative  Handed:  Right  AIMS (if indicated):     Assets:  Desire for Improvement Housing Resilience Social Support  Sleep:  Number of Hours: 2  Cognition: WNL  ADL's:  Intact   Mental Status Per Nursing Assessment::   On Admission:  NA  Demographic Factors:  Caucasian, Low socioeconomic status and Unemployed  Loss Factors: Decrease in vocational status, Decline in physical health and Financial problems/change in socioeconomic status  Historical Factors: Impulsivity  Risk Reduction Factors:    Responsible for children under 103 years of age, Living with another person, especially a relative and Positive social support  Continued Clinical Symptoms:  Severe Anxiety and/or Agitation Panic Attacks Bipolar Disorder:   Depressive phase Depression:   Impulsivity Insomnia Dysthymia Personality Disorders:   Cluster B Epilepsy Chronic Pain More than one psychiatric diagnosis Previous Psychiatric Diagnoses and Treatments Medical Diagnoses and Treatments/Surgeries  Cognitive Features That Contribute To Risk:  Polarized thinking    Suicide Risk:  Minimal: No identifiable suicidal ideation.  Patients presenting with no risk factors but with morbid ruminations; may be classified as minimal risk based on the severity of the depressive symptoms   Follow-up Information    [facility] Follow up.   Why: IOP, Virtual Contact information: [place], [place] [address]  P: [phone] F:               Plan Of Care/Follow-up recommendations:  Activity:  ad lib  Sharma Covert, MD 06/23/2020, 9:37 AM

## 2020-06-23 NOTE — Progress Notes (Signed)
Recreation Therapy Notes  Date:  7.19.20 Time: 0945 Location: [address] Group Room  Group Topic: Stress Management  Goal Area(s) Addresses:  Patient will identify positive stress management techniques. Patient will identify benefits of using stress management post d/c.  Intervention: Stress Management   Activity : Meditation.  LRT played a meditation that focused on being resilient in the face of adversity.  Patients were to listen to meditation as it played to engage in activity.   Education:  Stress Management, Discharge Planning.   Education Outcome: Acknowledges Education  Clinical Observations/Feedback: Pt did not attend group session.    Victorino Sparrow, LRT/CTRS         Ria Comment, Suella Cogar A 06/23/2020 11:23 AM

## 2020-06-23 NOTE — Discharge Summary (Signed)
Physician Discharge Summary Note  Patient:  Penny [name] is an 34 y.o., female MRN:  [id number] DOB:  1986-11-04 Patient phone:  [phone] (home)  Patient address:   [address] [place] [address],  Total Time spent with patient: 20 minutes  Date of Admission:  06/19/2020 Date of Discharge: 06/23/2020  Reason for Admission:  Patient is a 34 year old female with a past psychiatric history significant for depression, generalized anxiety disorder and attention deficit disorder who presented to the [facility] emergency department on 06/17/2020 with suicidal ideation.  Principal Problem: Major depressive disorder, recurrent episode [facility]) Discharge Diagnoses: Principal Problem:   Major depressive disorder, recurrent episode ([place]) Active Problems:   Major depression   Past Psychiatric History: Depression, Anxiety, ADHD. She had been on multiple medication in the past.She has been followed by Dr. Harrington Challenger in [place].  Past Medical History:  Past Medical History:  Diagnosis Date  . ADD (attention deficit disorder)   . Anemia 2011   2o to GASTRIC BYPASS  . Anginal pain ([place])   . Anxiety   . Blood transfusion without reported diagnosis   . Chronic daily headache   . Depression   . Depression   . Dysmenorrhea 09/18/2013  . Dysrhythmia   . Elevated liver enzymes JUL 2011ALK PHOS 111-127 AST  143-267 ALT  213-321T BILI 0.6  ALB  3.7-4.06 Jun 2011 ALK PHOS 118 AST 24 ALT 42 T BILI 0.4 ALB 3.9  . Encounter for drug rehabilitation    at behavioral health for opioid addiction about 4 years ago  . Family history of adverse reaction to anesthesia    'dad had to be kept on pump for breathing for morphine'  . Fatty liver   . Fibroid 01/18/2017  . Fibromyalgia   . Gastric bypass status for obesity   . Gastritis JULY 2011  . Heart murmur   . 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 History of Holter monitoring   . Hx of opioid abuse ([place])    for about 4  years, about 4 years ago  . Interstitial cystitis   . Iron deficiency anemia 07/23/2010  . Irritable bowel syndrome 2012 DIARRHEA   JUN 2012 TTG IgA 14.9  . IUD FEB 2010  . Lupus ([place])   . Menorrhagia 07/18/2013  . Migraines   . Obesity (BMI 30-39.9) 2011 228 LBS BMI 36.8  . Ovarian cyst   . Patient desires pregnancy 09/18/2013  . Polyneuropathy   . PONV (postoperative nausea and vomiting) seizure post-operatively   seizure following ankle surgery  . Potassium (K) deficiency   . Pregnant 12/25/2013  . Psychiatric pseudoseizure   . RLQ abdominal pain 07/18/2013  . Sciatica of left side 11/14/2014  . 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 Stress 09/18/2013    Past Surgical History:  Procedure Laterality Date  . BIOPSY  04/10/2015   Procedure: BIOPSY;  Surgeon: Daneil Dolin, MD;  Location: [facility];  Service: Endoscopy;;  . cath self every nite     for sodium bicarb injection (discontinued 2013)  . CHOLECYSTECTOMY  2005   biliary dyskinesia  . COLONOSCOPY  JUN 2012 ABD PN/DIARRHEA WITH PROPOFOL   NL COLON  . DILATION AND CURETTAGE OF UTERUS    . DILITATION & CURRETTAGE/HYSTROSCOPY WITH NOVASURE ABLATION N/A 03/24/2017   Procedure: DILATATION & CURETTAGE/HYSTEROSCOPY WITH NOVASURE ENDOMETRIAL ABLATION;  Surgeon: Jonnie Kind, MD;  Location: [facility];  Service: Gynecology;  Laterality: N/A;  . ESOPHAGEAL DILATION  N/A 04/10/2015   Procedure: ESOPHAGEAL DILATION WITH 54FR MALONEY DILATOR;  Surgeon: Corbin Ade, MD;  Location: [facility];  Service: Endoscopy;  Laterality: N/A;  . ESOPHAGOGASTRODUODENOSCOPY    . ESOPHAGOGASTRODUODENOSCOPY (EGD) WITH PROPOFOL N/A 04/10/2015   Procedure: ESOPHAGOGASTRODUODENOSCOPY (EGD) WITH PROPOFOL;  Surgeon: Corbin Ade, MD;  Location: [facility];  Service: Endoscopy;  Laterality: N/A;  . ESOPHAGOGASTRODUODENOSCOPY (EGD) WITH PROPOFOL N/A 12/06/2016   Procedure: ESOPHAGOGASTRODUODENOSCOPY (EGD) WITH PROPOFOL;  Surgeon: [place] Bali, MD;  Location: [facility];  Service: Endoscopy;  Laterality: N/A;  . ESOPHAGOGASTRODUODENOSCOPY (EGD) WITH PROPOFOL N/A 04/27/2019   Procedure: ESOPHAGOGASTRODUODENOSCOPY (EGD) WITH PROPOFOL;  Surgeon: Malissa Hippo, MD;  Location: [facility];  Service: Endoscopy;  Laterality: N/A;  730  . ESOPHAGOGASTRODUODENOSCOPY (EGD) WITH PROPOFOL N/A 08/31/2019   Procedure: ESOPHAGOGASTRODUODENOSCOPY (EGD) WITH PROPOFOL;  Surgeon: Malissa Hippo, MD;  Location[facility];  Service: Endoscopy;  Laterality: N/A;  . GAB  2007   in [place]-POUCH 5 CM  . GASTRIC BYPASS  06/2006  . HYSTEROSCOPY WITH D & C N/A 09/12/2014   Procedure: DILATATION AND CURETTAGE /HYSTEROSCOPY;  Surgeon: Tilda Burrow, MD;  Location: [facility];  Service: Gynecology;  Laterality: N/A;  . KYPHOPLASTY N/A 08/02/2018   Procedure: KYPHOPLASTY T12;  Surgeon: Venita Lick, MD;  Location: [facility];  Service: Orthopedics;  Laterality: N/A;  60 mins  . LAPAROSCOPIC TUBAL LIGATION Bilateral 03/24/2017   Procedure: LAPAROSCOPIC TUBAL LIGATION ([place] Rings);  Surgeon: Tilda Burrow, MD;  Location: [facility];  Service: Gynecology;  Laterality: Bilateral;  . REPAIR VAGINAL CUFF N/A 07/30/2014   Procedure: REPAIR VAGINAL CUFF;  Surgeon: Catalina Antigua, MD;  Location: [facility];  Service: Gynecology;  Laterality: N/A;  . SAVORY DILATION  06/20/2012   Dr. Cyndi Bender gastritis/Ulcer in the mid jejunum. Empiric dilation.   . SURAL NERVE BX Left 02/25/2016   Procedure: LEFT SURAL NERVE BIOPSY;  Surgeon: Shirlean Kelly, MD;  Location: [facility];  Service: Neurosurgery;  Laterality: Left;  Left sural nerve biopsy  . TONSILLECTOMY    . TONSILLECTOMY AND ADENOIDECTOMY    . UPPER GASTROINTESTINAL ENDOSCOPY  JULY 2011 NAUSEA-D125,V6, PH 25   Bx; GASTRITIS, POUCH-5 CM LONG  . WISDOM TOOTH EXTRACTION     Family History:  Family History  Problem Relation Age of Onset  . Hemochromatosis Maternal Grandmother   . Migraines Maternal Grandmother   . Cancer Maternal  Grandmother   . Breast cancer Maternal Grandmother   . Hypertension Father   . Diabetes Father   . Coronary artery disease Father   . Migraines Paternal Grandmother   . Breast cancer Paternal Grandmother   . Cancer Mother        breast  . Hemochromatosis Mother   . Breast cancer Mother   . Depression Mother   . Anxiety disorder Mother   . Coronary artery disease Paternal Grandfather   . Anxiety disorder Brother   . Bipolar disorder Brother   . Healthy Daughter   . Healthy Son    Family Psychiatric  History: History of depression in several family members and at least one suicide of completion. Social History:  Social History   Substance and Sexual Activity  Alcohol Use Yes  . Alcohol/week: 0.0 standard drinks   Comment: 1-2 beers a couple times a month      Social History   Substance and Sexual Activity  Drug Use Not Currently  . Types: Marijuana   Comment: Previously opioid  addiction went through rehab    Social History   Socioeconomic History  . Marital status: Divorced    Spouse name: Not on file  . Number of children: 2  . Years of education: some colle  . Highest education level: Not on file  Occupational History    Employer: NOT EMPLOYED  Tobacco Use  . Smoking status: Former Smoker    Packs/day: 0.25    Years: 6.00    Pack years: 1.50    Types: E-cigarettes    Start date: 05/2018  . Smokeless tobacco: Never Used  Vaping Use  . Vaping Use: Former  Substance and Sexual Activity  . Alcohol use: Yes    Alcohol/week: 0.0 standard drinks    Comment: 1-2 beers a couple times a month   . Drug use: Not Currently    Types: Marijuana    Comment: Previously opioid addiction went through rehab  . Sexual activity: Yes    Partners: Male    Birth control/protection: Surgical, Condom    Comment: tubal and ablation  Other Topics Concern  . Not on file  Social History Narrative   Patient is right handed.   Patient drinks one cup of coffee daily.   She lives  in a one-story home with her two children (37 year old son, 68 month old daughter).   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 Difficulty of Paying Living Expenses:   Food Insecurity:   . Worried About Charity fundraiser in the Last Year:   . Arboriculturist in the Last Year:   Transportation Needs:   . Film/video editor (Medical):   Marland Kitchen Lack of Transportation (Non-Medical):   Physical Activity:   . Days of Exercise per Week:   . Minutes of Exercise per Session:   Stress:   . Feeling of Stress :   Social Connections:   . Frequency of Communication with Friends and Family:   . Frequency of Social Gatherings with Friends and Family:   . Attends Religious Services:   . Active Member of Clubs or Organizations:   . Attends Archivist Meetings:   Marland Kitchen Marital Status:     Hospital Course:  Patient is a 34 year old female with a past psychiatric history significant for depression, generalized anxiety disorder and attention deficit disorder who presented to the [facility] emergency department on 06/17/2020 with suicidal ideation. Pt initially presented there for Profuse diarrhea for 5 days. At admission, Pt stated that her anxiety and depression has gotten worse since COVID started about one and half year ago. Pt lives with her fiance and 2 of her own kids and 2 of his fiance's kids. Pt stated she had been stressed out lately because of her kids being home and doing virtual school. Pt stated " I can't be the only parent at home and I don't want to ask for help as they are my kids". Pt stated she recently had a mental breakdown when she found her 33 year old and 33 year old playing in a sexualized manner. On questioning children said they were just playing. Pt's daughter father found that too as well as her mother so they become upset too.The patient's daughter is now staying at  her father's house.  Pt stated her step dad told her that he will take care of the  kids and she should get some help. Pt reported low and depressed mood, low appetite, crying episodes and anhedonia. Pt stated she has multiple medical problems and she can't take care of all responsibilities at home and is stressing about that. Pt is on long term Dilaudid therapy and Dr. Harrington Challenger has attempted to wean off her Clonazepam. She added Buspar. Pt had been on multiple antidepressant medications in the past and has multiple allergies. Urine toxicology- positive for Benzo and THC.  During inpatient Pt was treated with Cymbalta 60 mg BID, Depakote 250 mg Q12H,,Dilaudid, Beclofen, Clonazepam, Buprenorphine, Buspar, Flonase, Flovent spray, Duoned nebulizer, zofran, Patanol Eye drops, Protonix, Phenergen, Topamax, Trazodone and KLOR 15mg BID. Pt felt much better with time. Her anxiety decreased significantly and her mood improved.  Pt was seen today before her discharge. Pt is doing much better. Pt states her mood is much better now. Pt reports anxiety and states " its work in progress and takes time". Pt states she didn't sleep well last night and have been thinking a lot about home situation. Pt reports poor appetite. Pt is excited to go home and states sh will follow up on outpatient basis with psychiatrist and therapist. Currently, Pt denies any suicidal ideation, homicidal ideation and, visual and auditory hallucination. Pt denies any paranoia. Pt denies racing thoughts and unusual powers. Pt is hopeful for the future. Pt denies hopelessness, worthlessness, guilt and anhedonia. Pt is discharged today.  Physical Findings: AIMS: Facial and Oral Movements Muscles of Facial Expression: None, normal Lips and Perioral Area: None, normal Jaw: None, normal Tongue: None, normal,Extremity Movements Upper (arms, wrists, hands, fingers): None, normal Lower (legs, knees, ankles, toes): None, normal, Trunk Movements Neck,  shoulders, hips: None, normal, Overall Severity Severity of abnormal movements (highest score from questions above): None, normal Incapacitation due to abnormal movements: None, normal Patient's awareness of abnormal movements (rate only patient's report): No Awareness, Dental Status Current problems with teeth and/or dentures?: No Does patient usually wear dentures?: No  CIWA:    COWS:     Musculoskeletal: Strength & Muscle Tone: within normal limits Gait & Station: normal Patient leans: N/A  Psychiatric Specialty Exam: Physical Exam  Review of Systems  Blood pressure 110/73, pulse 85, temperature 98 F (36.7 C), temperature source Oral, resp. rate 16, SpO2 100 %.There is no height or weight on file to calculate BMI.  General Appearance: Casual  Eye Contact:  Fair  Speech:  Clear and Coherent  Volume:  Normal  Mood:  Anxious  Affect:  Congruent  Thought Process:  Coherent  Orientation:  Full (Time, Place, and Person)  Thought Content:  Negative  Suicidal Thoughts:  No  Homicidal Thoughts:  No  Memory:  Immediate;   Fair Recent;   Fair Remote;   Fair  Judgement:  Intact  Insight:  Fair  Psychomotor Activity:  Normal  Concentration:  Concentration: Fair and Attention Span: Fair  Recall:  FAnna Mariaof Knowledge:  Good  Language:  Good  Akathisia:  No  Handed:  Right  AIMS (if indicated):     Assets:  Housing Social Support  ADL's:  Intact  Cognition:  WNL  Sleep:  Number of Hours: 2        Has this patient used any form of tobacco in the last 30 days? (Cigarettes, Smokeless Tobacco, Cigars, and/or Pipes) Yes, No  Blood Alcohol level:  Lab Results  Component Value Date   ETH <10 06/17/2020   ETH <10 08/27/2019  Metabolic Disorder Labs:  Lab Results  Component Value Date   HGBA1C 5.7 (H) 01/15/2015   Lab Results  Component Value Date   PROLACTIN 80.0 (H) 05/09/2015   Lab Results  Component Value Date   CHOL 95 03/10/2012   TRIG 50 03/10/2012    HDL 38 (L) 03/10/2012   CHOLHDL 2.5 03/10/2012   VLDL 10 03/10/2012   LDLCALC 47 03/10/2012    See Psychiatric Specialty Exam and Suicide Risk Assessment completed by Attending Physician prior to discharge.  Discharge destination:  Home  Is patient on multiple antipsychotic therapies at discharge:  No   Has Patient had three or more failed trials of antipsychotic monotherapy by history:  No  Recommended Plan for Multiple Antipsychotic Therapies: NA  Discharge Instructions    Discharge instructions   Complete by: As directed    Patient is instructed to take all prescribed medications as recommended. Report any side effects or adverse reactions to your outpatient psychiatrist. Patient is instructed to abstain from alcohol and illegal drugs while on prescription medications. In the event of worsening symptoms, patient is instructed to call the crisis hotline, 911, or go to the nearest emergency department for evaluation and treatment.     Allergies as of 06/23/2020      Reactions   Gabapentin Other (See Comments)   Pt states that she was unresponsive after taking this medication, but her vitals remained stable.     Metoclopramide Hcl Anxiety, Other (See Comments)   Pt states that she felt like she was trapped in a box, and could not get out.  Pt also states that she had temporary loss of movement, weakness, and tingling.     Other Itching, Rash, Hives   Please use paper tape Other reaction(s): Other (See Comments) Please use paper tape   Tramadol Other (See Comments)   Seizures   Ativan [lorazepam] Other (See Comments)   combative   Latex Itching, Rash, Other (See Comments)   Burns   Lyrica [pregabalin] Other (See Comments)   suicidal   Trazodone And Nefazodone Other (See Comments)   Makes pt "like a zombie"   Zofran [ondansetron] Other (See Comments)   Migraines in high doses    Iron Other (See Comments)   PATIENT IS INTOLERANT TO ORAL IRON , SHE DOES NOT ABSORB IT.     Sulfa Antibiotics Hives   Butrans [buprenorphine] Rash   Patch caused rash, didn't help with pain   Nucynta [tapentadol] Nausea And Vomiting   Propofol Nausea And Vomiting   Tape Itching, Rash, Other (See Comments)   Please use paper tape   Toradol [ketorolac Tromethamine] Anxiety      Medication List    STOP taking these medications   Aimovig 70 MG/ML Soaj Generic drug: Erenumab-aooe   amphetamine-dextroamphetamine 30 MG 24 hr capsule Commonly known as: Adderall XR   cetirizine 10 MG tablet Commonly known as: ZYRTEC   levalbuterol 45 MCG/ACT inhaler Commonly known as: Xopenex HFA   montelukast 10 MG tablet Commonly known as: SINGULAIR   ondansetron 8 MG disintegrating tablet Commonly known as: ZOFRAN-ODT   phenazopyridine 200 MG tablet Commonly known as: PYRIDIUM   tiZANidine 4 MG tablet Commonly known as: ZANAFLEX   Trulance 3 MG Tabs Generic drug: Plecanatide     TAKE these medications     Indication  albuterol (2.5 MG/3ML) 0.083% nebulizer solution Commonly known as: PROVENTIL Take 3 mLs (2.5 mg total) by nebulization every 4 (four) hours as needed for wheezing.  Indication: Spasm of Lung Air Passages   Baclofen 5 MG Tabs Take 5 mg by mouth 3 (three) times daily as needed for muscle spasms.  Indication: Muscle Spasm   Belbuca 75 MCG Film Generic drug: Buprenorphine HCl Take 1 strip by mouth 2 (two) times daily.  Indication: Chronic Pain   budesonide-formoterol 160-4.5 MCG/ACT inhaler Commonly known as: Symbicort Inhale 2 puffs into the lungs 2 (two) times daily.  Indication: Asthma   busPIRone 5 MG tablet Commonly known as: BUSPAR Take 1 tablet (5 mg total) by mouth 3 (three) times daily.  Indication: Anxiety Disorder   clonazePAM 0.5 MG tablet Commonly known as: KlonoPIN Take 1 tablet (0.5 mg total) by mouth 2 (two) times daily as needed for anxiety. What changed: when to take this  Indication: Feeling Anxious   divalproex 500 MG DR  tablet Commonly known as: DEPAKOTE Take 1 tablet (500 mg total) by mouth every 12 (twelve) hours.  Indication: Depressive Phase of Manic-Depression   DULoxetine 60 MG capsule Commonly known as: CYMBALTA Take 1 capsule (60 mg total) by mouth 2 (two) times daily.  Indication: Major Depressive Disorder, Musculoskeletal Pain   HYDROmorphone 2 MG tablet Commonly known as: Dilaudid Take 2 tablets (4 mg total) by mouth every 12 (twelve) hours as needed for severe pain. Take 1-2 tablets po q 4 hrs prn pain no greater than 9 tablets/day What changed:   how much to take  how to take this  when to take this  reasons to take this  Indication: Moderate to Severe Pain   pantoprazole 40 MG tablet Commonly known as: PROTONIX TAKE 1 TABLET BY MOUTH TWICE DAILY BEFORE MEALS. What changed: when to take this  Indication: Gastroesophageal Reflux Disease   promethazine 25 MG tablet Commonly known as: PHENERGAN TAKE (1) TABLET BY MOUTH TWICE A DAY AS NEEDED. What changed: See the new instructions.  Indication: Nausea and Vomiting   topiramate 100 MG tablet Commonly known as: Topamax Take 1 tablet (100 mg total) by mouth 2 (two) times daily.  Indication: Migraine Headache   traZODone 150 MG tablet Commonly known as: DESYREL Take 1 tablet (150 mg total) by mouth at bedtime.  Indication: [facility] to.   Why: They only do walk in assessments. Walk in for Mental Health IOP on Monday, Wednesday, or Friday between 8am and 2pm.  Contact information: [place] [address] [phone]               Follow-up recommendations:  Activity:  As tolerated Diet:  As recommended by your primary care doctor.  Comments:  Prescriptions given at discharge.  Patient agreeable to plan.  Given opportunity to ask questions.  Appears to feel comfortable with discharge denies any current suicidal or homicidal  thought. Patient is also instructed prior to discharge to: Take all medications as prescribed by her mental healthcare provider. Report any adverse effects and or reactions from the medicines to her outpatient provider promptly. Patient has been instructed & cautioned: To not engage in alcohol and or illegal drug use while on prescription medicines. In the event of worsening symptoms, patient is instructed to call the crisis hotline, 911 and or go to the nearest ED for appropriate evaluation and treatment of symptoms. To follow-up with her primary care provider for your other medical issues, concerns and or health care needs.  Signed: Armando Reichert, MD 06/23/2020,  12:53 PM

## 2020-06-23 NOTE — Progress Notes (Signed)
RN met with pt and reviewed pt's discharge instructions.  Pt verbalized understanding of discharge instructions and pt did not have any questions. RN reviewed and provided pt with a copy of SRA, AVS and Transition Record.  RN returned pt's belongings to pt.  Pt denied SI/HI/AVH and voiced no concerns.   Patient discharged to the lobby without incident. 

## 2020-06-23 NOTE — Progress Notes (Signed)
Spiritual care group on grief and loss facilitated by chaplain Markea Ruzich  Group Goal:  Support / Education around grief and loss Members engage in facilitated group support and psycho-social education.  Group Description:  Following introductions and group rules, group members engaged in facilitated group dialog and support around topic of loss, with particular support around experiences of loss in their lives. Group Identified types of loss (relationships / self / things) and identified patterns, circumstances, and changes that precipitate losses. Reflected on thoughts / feelings around loss, normalized grief responses, and recognized variety in grief experience. Patient Progress: Did not attend  

## 2020-06-25 ENCOUNTER — Emergency Department (HOSPITAL_COMMUNITY)
Admission: EM | Admit: 2020-06-25 | Discharge: 2020-06-26 | Disposition: A | Discharge status: Home / Self Care | Payer: Medicaid Other | Attending: Emergency Medicine | Admitting: Emergency Medicine

## 2020-06-25 ENCOUNTER — Other Ambulatory Visit: Payer: Self-pay

## 2020-06-25 ENCOUNTER — Ambulatory Visit (INDEPENDENT_AMBULATORY_CARE_PROVIDER_SITE_OTHER): Payer: Medicaid Other | Admitting: Clinical

## 2020-06-25 ENCOUNTER — Encounter (HOSPITAL_COMMUNITY): Payer: Self-pay | Admitting: Emergency Medicine

## 2020-06-25 ENCOUNTER — Emergency Department (HOSPITAL_COMMUNITY): Payer: Medicaid Other

## 2020-06-25 DIAGNOSIS — Z7951 Long term (current) use of inhaled steroids: Secondary | ICD-10-CM | POA: Diagnosis not present

## 2020-06-25 DIAGNOSIS — M40209 Unspecified kyphosis, site unspecified: Secondary | ICD-10-CM | POA: Diagnosis not present

## 2020-06-25 DIAGNOSIS — Y9269 Other specified industrial and construction area as the place of occurrence of the external cause: Secondary | ICD-10-CM | POA: Insufficient documentation

## 2020-06-25 DIAGNOSIS — Y9389 Activity, other specified: Secondary | ICD-10-CM | POA: Insufficient documentation

## 2020-06-25 DIAGNOSIS — Y998 Other external cause status: Secondary | ICD-10-CM | POA: Insufficient documentation

## 2020-06-25 DIAGNOSIS — W010XXA Fall on same level from slipping, tripping and stumbling without subsequent striking against object, initial encounter: Secondary | ICD-10-CM | POA: Insufficient documentation

## 2020-06-25 DIAGNOSIS — Z87891 Personal history of nicotine dependence: Secondary | ICD-10-CM | POA: Insufficient documentation

## 2020-06-25 DIAGNOSIS — Z79899 Other long term (current) drug therapy: Secondary | ICD-10-CM | POA: Diagnosis not present

## 2020-06-25 DIAGNOSIS — R600 Localized edema: Secondary | ICD-10-CM | POA: Diagnosis not present

## 2020-06-25 DIAGNOSIS — F332 Major depressive disorder, recurrent severe without psychotic features: Secondary | ICD-10-CM | POA: Diagnosis not present

## 2020-06-25 DIAGNOSIS — S069X9A Unspecified intracranial injury with loss of consciousness of unspecified duration, initial encounter: Secondary | ICD-10-CM | POA: Diagnosis not present

## 2020-06-25 DIAGNOSIS — F411 Generalized anxiety disorder: Secondary | ICD-10-CM

## 2020-06-25 DIAGNOSIS — S32010A Wedge compression fracture of first lumbar vertebra, initial encounter for closed fracture: Secondary | ICD-10-CM | POA: Diagnosis not present

## 2020-06-25 DIAGNOSIS — Z91040 Latex allergy status: Secondary | ICD-10-CM | POA: Insufficient documentation

## 2020-06-25 DIAGNOSIS — S32040A Wedge compression fracture of fourth lumbar vertebra, initial encounter for closed fracture: Secondary | ICD-10-CM | POA: Diagnosis not present

## 2020-06-25 DIAGNOSIS — R001 Bradycardia, unspecified: Secondary | ICD-10-CM | POA: Diagnosis not present

## 2020-06-25 DIAGNOSIS — R200 Anesthesia of skin: Secondary | ICD-10-CM | POA: Diagnosis not present

## 2020-06-25 DIAGNOSIS — R4020 Unspecified coma: Secondary | ICD-10-CM | POA: Diagnosis not present

## 2020-06-25 DIAGNOSIS — M545 Low back pain: Secondary | ICD-10-CM | POA: Diagnosis not present

## 2020-06-25 DIAGNOSIS — R55 Syncope and collapse: Secondary | ICD-10-CM | POA: Diagnosis not present

## 2020-06-25 DIAGNOSIS — R52 Pain, unspecified: Secondary | ICD-10-CM | POA: Diagnosis not present

## 2020-06-25 MED ORDER — HYDROMORPHONE HCL 2 MG PO TABS
4.0000 mg | ORAL_TABLET | Freq: Once | ORAL | Status: AC
  Administered 2020-06-25: 4 mg via ORAL
  Filled 2020-06-25: qty 2

## 2020-06-25 NOTE — ED Notes (Signed)
Pt given 2 cups of water

## 2020-06-25 NOTE — ED Triage Notes (Signed)
Patient states that she had a syncopal episode on her back. Patient conscious at home and completely passed out and fell on and landed on her back. Patient states it is hard to breath at this time.

## 2020-06-25 NOTE — ED Provider Notes (Signed)
TLSO has been fitted by tech.   Rolland Porter, MD 06/25/20 [phone]

## 2020-06-25 NOTE — Progress Notes (Signed)
Virtual Visit via Video Note  I connected with Penny [name] on 06/25/20 at  4:00 PM EDT by a video enabled telemedicine application and verified that I am speaking with the correct person using two identifiers.  Location: Patient: Home  Provider: Office   I discussed the limitations of evaluation and management by telemedicine and the availability of in person appointments. The patient expressed understanding and agreed to proceed.         Comprehensive Clinical Assessment (CCA) Note  06/25/2020 Penny [name] [id number]  Visit Diagnosis:      ICD-10-CM   1. GAD (generalized anxiety disorder)  F41.1   2. Severe episode of recurrent major depressive disorder, without psychotic features ([place])  F33.2       CCA Screening, Triage and Referral (STR)  Patient Reported Information How did you hear about [name]? Family/Friend  Referral name: [name], Step-Father  Referral phone number: [phone]   [place] do you see for routine medical problems? Primary Care  Practice/Facility Name: [facility]  Practice/Facility Phone Number: No data recorded Name of Contact: [name], MD  Contact Number: No data recorded Contact Fax Number: No data recorded Prescriber Name: No data recorded Prescriber Address (if known): No data recorded  What Is the Reason for Your Visit/Call Today? Pt has been sick in bed for approx 5 days. She shares she has no energy and has no desire to get out of bed; she states she believes it's depression and anxiety.  How Long Has This Been Causing You Problems? > than 6 months  What Do You Feel Would Help You the Most Today? Therapy   Have You Recently Been in Any Inpatient Treatment (Hospital/Detox/Crisis Center/28-Day Program)? No  Name/Location of Program/Hospital:No data recorded How Long Were You There? No data recorded When Were You Discharged? No data recorded  Have You Ever Received Services From [facility] Before?  No  Who Do You See at [facility]? No data recorded  Have You Recently Had Any Thoughts About Hurting Yourself? No  Are You Planning to Commit Suicide/Harm Yourself At This time? No   Have you Recently Had Thoughts About [place]? No  Explanation: No data recorded  Have You Used Any Alcohol or Drugs in the Past 24 Hours? No  How Long Ago Did You Use Drugs or Alcohol? No data recorded What Did You Use and How Much? No data recorded  Do You Currently Have a Therapist/Psychiatrist? Yes  Name of Therapist/Psychiatrist: [name] - [facility]   Have You Been Recently Discharged From Any Office Practice or Programs? No  Explanation of Discharge From Practice/Program: No data recorded    CCA Screening Triage Referral Assessment Type of Contact: Tele-Assessment  Is this Initial or Reassessment? Initial Assessment  Date Telepsych consult ordered in [facility]:  06/17/20  Time Telepsych consult ordered in [facility]:  1725   Patient Reported Information Reviewed? Yes  Patient Left Without Being Seen? No data recorded Reason for Not Completing Assessment: No data recorded  Collateral Involvement: Penny [name], mother: [phone]   Does Patient Have a Court Appointed Legal Guardian? No data recorded Name and Contact of Legal Guardian: No data recorded If Minor and Not Living with Parent(s), Who has Custody? N/A  Is CPS involved or ever been involved? In the Past  Is APS involved or ever been involved? Never   Patient Determined To Be At Risk for Harm To Self or Others Based on Review of Patient Reported  Information or Presenting Complaint? No data recorded Method: No data recorded Availability of Means: No data recorded Intent: No data recorded Notification Required: No data recorded Additional Information for Danger to Others Potential: No data recorded Additional Comments for Danger to Others Potential: No data recorded Are There  Guns or Other Weapons in Your Home? No data recorded Types of Guns/Weapons: No data recorded Are These Weapons Safely Secured?                            No data recorded Who Could Verify You Are Able To Have These Secured: No data recorded Do You Have any Outstanding Charges, Pending Court Dates, Parole/Probation? No data recorded Contacted To Inform of Risk of Harm To Self or Others: No data recorded  Location of Assessment: [facility]   Does Patient Present under Involuntary Commitment? No  IVC Papers Initial File Date: No data recorded  [place] of Residence: [place]   Patient Currently Receiving the Following Services: Medication Management   Determination of Need: Emergent (2 hours)   Options For Referral: Inpatient Hospitalization     CCA Biopsychosocial  Intake/Chief Complaint:  CCA Intake With Chief Complaint CCA Part Two Date: 06/25/20 CCA Part Two Time: 62 Chief Complaint/Presenting Problem: Need therapists, medication Patient's Currently Reported Symptoms/Problems: Depression, tearfulness, chronic pain, unable to work, divorce, loss of interest, irritability, sleep changes, low energy, panic attacks Individual's Strengths: good mother, trained and worked as EMT, walking despite severe back injury Individual's Preferences: needs intensive treatment Individual's Abilities: 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 and received gastric bypass surgery at age 66. Long hx of medical complications including complicated pregnancy/delivery. The patient broke her back while at work in June 2019.  Mental Health Symptoms Depression:  Depression: Change in energy/activity, Difficulty Concentrating, Fatigue, Hopelessness, Irritability, Sleep (too much or  little), Tearfulness, Worthlessness, Increase/decrease in appetite, Weight gain/loss, Duration of symptoms less than two weeks  Mania:  Mania: None  Anxiety:   Anxiety: Difficulty concentrating, Fatigue, Irritability, Restlessness, Sleep, Tension, Worrying  Psychosis:  Psychosis: None  Trauma:  Trauma: None  Obsessions:  Obsessions: None  Compulsions:  Compulsions: None  Inattention:  Inattention: None  Hyperactivity/Impulsivity:  Hyperactivity/Impulsivity: N/A  Oppositional/Defiant Behaviors:  Oppositional/Defiant Behaviors: None  Emotional Irregularity:  Emotional Irregularity: None  Other Mood/Personality Symptoms:  Other Mood/Personality Symptoms: No Additional   Mental Status Exam Appearance and self-care  Stature:  Stature: Average  Weight:  Weight: Average weight  Clothing:  Clothing: Neat/clean  Grooming:  Grooming: Well-groomed  Cosmetic use:  Cosmetic Use: Age appropriate  Posture/gait:  Posture/Gait: Normal  Motor activity:  Motor Activity: Not Remarkable  Sensorium  Attention:  Attention: Normal  Concentration:  Concentration: Normal, Anxiety interferes  Orientation:  Orientation: X5  Recall/memory:  Recall/Memory: Normal  Affect and Mood  Affect:  Affect: Depressed, Tearful  Mood:  Mood: Depressed  Relating  Eye contact:  Eye Contact: Normal  Facial expression:  Facial Expression: Depressed, Sad  Attitude toward examiner:  Attitude Toward Examiner: Cooperative  Thought and Language  Speech flow: Speech Flow: Normal  Thought content:  Thought Content: Appropriate to Mood and Circumstances  Preoccupation:  Preoccupations: Other (Comment) (NA)  Hallucinations:  Hallucinations: Other (Comment) (NA)  Organization:   Logical  Transport planner of Knowledge:  Fund of Knowledge: Average  Intelligence:  Intelligence: Average  Abstraction:  Abstraction: Normal  Judgement:  Judgement: Normal  Reality Testing:  Reality Testing: Adequate  Insight:  Insight: Fair   Decision Making:  Decision Making: Normal  Social Functioning  Social Maturity:  Social Maturity: Responsible  Social Judgement:  Social Judgement: Normal  Stress  Stressors:  Stressors: Family conflict, Illness, Work (The patient had ongoing difficulty with her back.)  Coping Ability:  Coping Ability: Deficient supports  Skill Deficits:  Skill Deficits: None  Supports:  Supports: Family     Religion: Religion/Spirituality Are You A Religious Person?: No How Might This Affect Treatment?: NA  Leisure/Recreation: Leisure / Recreation Do You Have Hobbies?: No  Exercise/Diet: Exercise/Diet Do You Exercise?: No Have You Gained or Lost A Significant Amount of Weight in the Past Six Months?: Yes-Lost Number of Pounds Lost?: 50 Do You Follow a Special Diet?: No Do You Have Any Trouble Sleeping?: Yes Explanation of Sleeping Difficulties: The patient notes difficulty with both falling asleep as well as staying asleep   CCA Employment/Education  Employment/Work Situation: Employment / Work Copywriter, advertising Employment situation: Unemployed (workers  comp) Patient's job has been impacted by current illness: No What is the longest time patient has a held a job?: 2 years 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  Has patient ever been in the [employer]?: No  Education: Education Is Patient Currently Attending School?: No Last Grade Completed: 12 Name of [place]: [employer] Did Teacher, adult education From [employer]?: Yes Did Physicist, medical?: Yes What Type of College Degree Do you Have?: [employer] and IT trainer 1&2 Did Heritage manager?: No What Was Your Major?: NA Did You Have Any Special Interests In School?: NA Did You Have An Individualized Education Program (IIEP): No Did You Have Any Difficulty At [employer]?: No Patient's Education Has Been Impacted by Current Illness:  No   CCA Family/Childhood History  Family and Relationship History: Family history Marital status: Divorced Divorced, when?: December 2019 What types of issues is patient dealing with in the relationship?: None Are you sexually active?: Yes What is your sexual orientation?: heterosexual Has your sexual activity been affected by drugs, alcohol, medication, or emotional stress?: NA Does patient have children?: Yes How many children?: 2 How is patient's relationship with their children?: "Good. I want to be a good mother for them"  Childhood History:  Childhood History By whom was/is the patient raised?: Both parents Additional childhood history information: No Additional Description of patient's relationship with caregiver when they were a child: Had a good relationship with both parents, who were together until 2 years ago, divorcing after 48 years of marriage Patient's description of current relationship with people who raised him/her: Good How were you disciplined when you got in trouble as a child/adolescent?: Grounded Does patient have siblings?: Yes Number of Siblings: 1 Description of patient's current relationship with siblings: Good Did patient suffer any verbal/emotional/physical/sexual abuse as a child?: No Did patient suffer from severe childhood neglect?: No Has patient ever been sexually abused/assaulted/raped as an adolescent or adult?: No Was the patient ever a victim of a crime or a disaster?: No Witnessed domestic violence?: No Has patient been affected by domestic violence as an adult?: No  Child/Adolescent Assessment:     CCA Substance Use  Alcohol/Drug Use: Alcohol / Drug Use Pain Medications: See pt  chart Prescriptions: See pt chart Over the Counter: Pt denies History of alcohol / drug use?: Yes Longest period of sobriety (when/how long): NA                         ASAM's:  Six Dimensions of Multidimensional Assessment  Dimension 1:   Acute Intoxication and/or Withdrawal Potential:      Dimension 2:  Biomedical Conditions and Complications:      Dimension 3:  Emotional, Behavioral, or Cognitive Conditions and Complications:     Dimension 4:  Readiness to Change:     Dimension 5:  Relapse, Continued use, or Continued Problem Potential:     Dimension 6:  Recovery/Living Environment:     ASAM Severity Score:    ASAM Recommended Level of Treatment:     Substance use Disorder (SUD)    Recommendations for Services/Supports/Treatments: Recommendations for Services/Supports/Treatments Recommendations For Services/Supports/Treatments: Individual Therapy, Medication Management  DSM5 Diagnoses: Patient Active Problem List   Diagnosis Date Noted  . Major depressive disorder, recurrent episode ([place]) 06/22/2020  . Major depression 06/19/2020  . Dehydration   . Intractable abdominal pain 09/19/2019  . Intractable nausea and vomiting 09/19/2019  . Gastroesophageal reflux disease without esophagitis   . Cannabis abuse 08/30/2019  . Dyspnea   . Severe anxiety 08/21/2019  . Hematemesis with nausea 04/23/2019  . Absolute anemia 04/23/2019  . Urge incontinence 10/26/2018  . S/P kyphoplasty 08/02/2018  . Herpes genitalis in women 12/19/2017  . Cannabis use disorder, moderate, dependence ([place]) 07/07/2017  . Abnormal urine odor 03/30/2017  . Urinary tract infection with hematuria 03/30/2017  . Screening for genitourinary condition 03/30/2017  . Pain 03/30/2017  . Burning with urination 03/30/2017  . Family history of breast cancer in mother,uncertain BR CA status 02/28/2017  . Fibroid 01/18/2017  . Chronic pain syndrome 12/05/2016  . Folate deficiency 12/04/2016  . Nausea vomiting and diarrhea 12/04/2016  . Severe anemia 12/04/2016  . Symptomatic anemia 12/03/2016  . Anastomotic ulcer 04/22/2016  . Pain medication agreement signed 04/22/2016  . Complaints of leg weakness   . Paresthesia   . Left-sided weakness   .  Uncontrolled pain 01/24/2016  . Malnutrition of moderate degree 01/24/2016  . Neuropathy 01/23/2016  . Inability to walk 01/23/2016  . Paresthesias   . Bilateral leg numbness 11/26/2015  . Major depressive disorder, recurrent episode, 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[place]) 05/10/2015  . Post partum depression 05/09/2015  . Dysphagia   . Hematemesis 04/07/2015  . Intractable vomiting with nausea 04/07/2015  . Elevated liver enzymes   . Epigastric pain   . 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 Seizure-like activity ([place]) 02/22/2015  . Fibromyalgia 02/18/2015  . Vulvar fissure 02/18/2015  . Clinical depression 02/01/2015  . Current smoker 01/29/2015  . Current tobacco use 01/29/2015  . 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 Leg weakness, bilateral 01/02/2015  . Gait disorder 01/02/2015  . Other symptoms and signs involving the musculoskeletal system 01/02/2015  . Abnormal gait 01/02/2015  . Cervicalgia 12/18/2014  . Sciatica of left side 11/14/2014  . Neuralgia neuritis, sciatic nerve 11/14/2014  . 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 Encounter for sterilization 09/09/2014  . Abdominal pain, acute, right lower quadrant 08/22/2014  . Headache, migraine 08/19/2014  . Seizure ([place]) 08/18/2014  . Encephalopathy 08/13/2014  . Headache 08/13/2014  . Altered mental status 08/13/2014  . Right leg numbness 07/31/2014  . Disturbance of skin sensation 07/31/2014  . Hypertension in pregnancy, transient 07/08/2014  .  Previous gastric bypass affecting pregnancy, antepartum 05/22/2014  . History of bariatric surgery 05/22/2014  . 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 Antepartum mental disorder in pregnancy 05/09/2014  . Rapid palpitations 02/18/2014  . H/O maternal third degree perineal laceration, currently pregnant 02/18/2014  . High-risk pregnancy 02/18/2014  . Supervision of pregnancy with other poor reproductive  or obstetric history, unspecified trimester 02/18/2014  . Restless legs 01/16/2014  . Restless leg 01/16/2014  . Chronic interstitial cystitis 10/23/2013  . Menorrhagia 07/18/2013  . Excessive and frequent menstruation 07/18/2013  . h/o Opiate addiction 03/11/2012    Class: Acute  . History of migraine headaches 03/10/2012    Class: Acute  . Depression with anxiety 03/10/2012    Class: Chronic  . 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 Attention deficit hyperactivity disorder, 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    Class: Chronic  . Panic disorder without agoraphobia 03/10/2012  . H/O disease 03/10/2012  . Dysthymia 03/10/2012  . 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 Other specified behavioral and emotional disorders with onset usually occurring in childhood and adolescence 03/10/2012  . Pelvic congestion syndrome 10/13/2011  . Coitalgia 10/13/2011  . Chronic migraine without aura 10/13/2011  . Unspecified dyspareunia 10/13/2011  . Other specified conditions associated with female genital organs and menstrual cycle 10/13/2011  . IBS (irritable bowel syndrome) 08/25/2011  . Diarrhea 05/27/2011  . OBESITY, UNSPECIFIED 09/17/2010  . Transaminitis 09/17/2010  . IDA (iron deficiency anemia) 07/23/2010    Patient Centered Plan: Patient is on the following Treatment Plan(s):  Depression and Anxiety  Referrals to Alternative Service(s): Referred to Alternative Service(s):   Place:   Date:   Time:    Referred to Alternative Service(s):   Place:   Date:   Time:    Referred to Alternative Service(s):   Place:   Date:   Time:    Referred to Alternative Service(s):   Place:   Date:   Time:     I discussed the assessment and treatment plan with the patient. The patient was provided an opportunity to ask questions and all were answered. The patient agreed with the plan and demonstrated an understanding of  the instructions.   The patient was advised to call back or seek an in-person evaluation if the symptoms worsen or if the condition fails to improve as anticipated.  I provided 60 minutes of non-face-to-face time during this encounter.  Lennox Grumbles, LCSW 06/25/2020

## 2020-06-25 NOTE — Progress Notes (Signed)
Orthopedic Tech Progress Note Patient Details:  Penny [name] 28-Oct-1986 [id number] TLSO Brace has been ordered  Patient ID: Penny [name], female   DOB: 09/05/1986, 34 y.o.   MRN: [id number]   Jearld Lesch 06/25/2020, 10:28 PM

## 2020-06-25 NOTE — ED Provider Notes (Signed)
[facility] EMERGENCY DEPARTMENT Provider Note   CSN: [id number] Arrival date & time: 06/25/20  1952     History Chief Complaint  Patient presents with  . Loss of Consciousness    Penny [name] is a 34 y.o. female.  HPI   This patient is a 34 year old female, she is well-known to the emergency department for a month mental health problems, recent evaluation and admission to the hospital for suicidal thoughts anxiety and depression.  She has a history of chronic anemia secondary to gastric bypass, she has had problems with chronic pain and takes daily Dilaudid.  She states that she was at her home today she has been feeling lightheaded for the last week, she had had diarrhea for quite some time.  In the course of being lightheaded she states that she stood up, started to get dizzy headed and then fell to the ground landing on her lower back.  She thinks that she passed out.  She woke up crying in pain from her lower back hurting and her fianc helped her onto the couch.  She arrives by EMS transport with back pain and a complaint of passing out.  She denies any chest pain shortness of breath palpitations or head injury and denies any head or neck pain at all.  There is no headache, no blurred vision, no numbness or weakness.  The patient reports she is required a previous kyphoplasty, this was done years ago on T12 after having a similar event.  No other open surgeries on her back, she did recently start some new medications at behavioral health but has been tolerating them.  She has been pushing fluids the last couple of days stating she has been drinking lots of Gatorade.  She is making urine, she has no longer had any diarrhea.  Past Medical History:  Diagnosis Date  . ADD (attention deficit disorder)   . Anemia 2011   2o to GASTRIC BYPASS  . Anginal pain ([place])   . Anxiety   . Blood transfusion without reported diagnosis   . Chronic daily headache   . Depression   . Depression   .  Dysmenorrhea 09/18/2013  . Dysrhythmia   . Elevated liver enzymes JUL 2011ALK PHOS 111-127 AST  143-267 ALT  213-321T BILI 0.6  ALB  3.7-4.06 Jun 2011 ALK PHOS 118 AST 24 ALT 42 T BILI 0.4 ALB 3.9  . Encounter for drug rehabilitation    at behavioral health for opioid addiction about 4 years ago  . Family history of adverse reaction to anesthesia    'dad had to be kept on pump for breathing for morphine'  . Fatty liver   . Fibroid 01/18/2017  . Fibromyalgia   . Gastric bypass status for obesity   . Gastritis JULY 2011  . Heart murmur   . 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 History of Holter monitoring   . Hx of opioid abuse ([place])    for about 4 years, about 4 years ago  . Interstitial cystitis   . Iron deficiency anemia 07/23/2010  . Irritable bowel syndrome 2012 DIARRHEA   JUN 2012 TTG IgA 14.9  . IUD FEB 2010  . Lupus ([place])   . Menorrhagia 07/18/2013  . Migraines   . Obesity (BMI 30-39.9) 2011 228 LBS BMI 36.8  . Ovarian cyst   . Patient desires pregnancy 09/18/2013  . Polyneuropathy   . PONV (postoperative nausea and vomiting) seizure post-operatively   seizure following  ankle surgery  . Potassium (K) deficiency   . Pregnant 12/25/2013  . Psychiatric pseudoseizure   . RLQ abdominal pain 07/18/2013  . Sciatica of left side 11/14/2014  . 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 Stress 09/18/2013    Patient Active Problem List   Diagnosis Date Noted  . Major depressive disorder, recurrent episode ([place]) 06/22/2020  . Major depression 06/19/2020  . Dehydration   . Intractable abdominal pain 09/19/2019  . Intractable nausea and vomiting 09/19/2019  . Gastroesophageal reflux disease without esophagitis   . Cannabis abuse 08/30/2019  . Dyspnea   . Severe anxiety 08/21/2019  . Hematemesis with nausea 04/23/2019  . Absolute anemia 04/23/2019  . Urge incontinence 10/26/2018  . S/P kyphoplasty 08/02/2018  . Herpes genitalis in women 12/19/2017    . Cannabis use disorder, moderate, dependence ([place]) 07/07/2017  . Abnormal urine odor 03/30/2017  . Urinary tract infection with hematuria 03/30/2017  . Screening for genitourinary condition 03/30/2017  . Pain 03/30/2017  . Burning with urination 03/30/2017  . Family history of breast cancer in mother,uncertain BR CA status 02/28/2017  . Fibroid 01/18/2017  . Chronic pain syndrome 12/05/2016  . Folate deficiency 12/04/2016  . Nausea vomiting and diarrhea 12/04/2016  . Severe anemia 12/04/2016  . Symptomatic anemia 12/03/2016  . Anastomotic ulcer 04/22/2016  . Pain medication agreement signed 04/22/2016  . Complaints of leg weakness   . Paresthesia   . Left-sided weakness   . Uncontrolled pain 01/24/2016  . Malnutrition of moderate degree 01/24/2016  . Neuropathy 01/23/2016  . Inability to walk 01/23/2016  . Paresthesias   . Bilateral leg numbness 11/26/2015  . Major depressive disorder, recurrent episode, 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[place]) 05/10/2015  . Post partum depression 05/09/2015  . Dysphagia   . Hematemesis 04/07/2015  . Intractable vomiting with nausea 04/07/2015  . Elevated liver enzymes   . Epigastric pain   . Pseudoseizure ([place]) 02/22/2015  . Seizure-like activity ([place]) 02/22/2015  . Fibromyalgia 02/18/2015  . Vulvar fissure 02/18/2015  . Clinical depression 02/01/2015  . Current smoker 01/29/2015  . Current tobacco use 01/29/2015  . 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 Leg weakness, bilateral 01/02/2015  . Gait disorder 01/02/2015  . Other symptoms and signs involving the musculoskeletal system 01/02/2015  . Abnormal gait 01/02/2015  . Cervicalgia 12/18/2014  . Sciatica of left side 11/14/2014  . Neuralgia neuritis, sciatic nerve 11/14/2014  . 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 Encounter for sterilization 09/09/2014  . Abdominal pain, acute, right lower quadrant 08/22/2014  . Headache, migraine 08/19/2014  . Seizure ([place]) 08/18/2014   . Encephalopathy 08/13/2014  . Headache 08/13/2014  . Altered mental status 08/13/2014  . Right leg numbness 07/31/2014  . Disturbance of skin sensation 07/31/2014  . Hypertension in pregnancy, transient 07/08/2014  . Previous gastric bypass affecting pregnancy, antepartum 05/22/2014  . History of bariatric surgery 05/22/2014  . 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 Antepartum mental disorder in pregnancy 05/09/2014  . Rapid palpitations 02/18/2014  . H/O maternal third degree perineal laceration, currently pregnant 02/18/2014  . High-risk pregnancy 02/18/2014  . Supervision of pregnancy with other poor reproductive or obstetric history, unspecified trimester 02/18/2014  . Restless legs 01/16/2014  . Restless leg 01/16/2014  . Chronic interstitial cystitis 10/23/2013  . Menorrhagia 07/18/2013  . Excessive and frequent menstruation 07/18/2013  . h/o Opiate addiction 03/11/2012    Class: Acute  . History of migraine  headaches 03/10/2012    Class: Acute  . Depression with anxiety 03/10/2012    Class: Chronic  . 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 Attention deficit hyperactivity disorder, 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    Class: Chronic  . Panic disorder without agoraphobia 03/10/2012  . H/O disease 03/10/2012  . Dysthymia 03/10/2012  . 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 Other specified behavioral and emotional disorders with onset usually occurring in childhood and adolescence 03/10/2012  . Pelvic congestion syndrome 10/13/2011  . Coitalgia 10/13/2011  . Chronic migraine without aura 10/13/2011  . Unspecified dyspareunia 10/13/2011  . Other specified conditions associated with female genital organs and menstrual cycle 10/13/2011  . IBS (irritable bowel syndrome) 08/25/2011  . Diarrhea 05/27/2011  . OBESITY,  UNSPECIFIED 09/17/2010  . Transaminitis 09/17/2010  . IDA (iron deficiency anemia) 07/23/2010    Past Surgical History:  Procedure Laterality Date  . BIOPSY  04/10/2015   Procedure: BIOPSY;  Surgeon: Daneil Dolin, MD;  Location: [facility];  Service: Endoscopy;;  . cath self every nite     for sodium bicarb injection (discontinued 2013)  . CHOLECYSTECTOMY  2005   biliary dyskinesia  . COLONOSCOPY  JUN 2012 ABD PN/DIARRHEA WITH PROPOFOL   NL COLON  . DILATION AND CURETTAGE OF UTERUS    . DILITATION & CURRETTAGE/HYSTROSCOPY WITH NOVASURE ABLATION N/A 03/24/2017   Procedure: DILATATION & CURETTAGE/HYSTEROSCOPY WITH NOVASURE ENDOMETRIAL ABLATION;  Surgeon: Jonnie Kind, MD;  Location: [facility];  Service: Gynecology;  Laterality: N/A;  . ESOPHAGEAL DILATION N/A 04/10/2015   Procedure: ESOPHAGEAL DILATION WITH 54FR MALONEY DILATOR;  Surgeon: Daneil Dolin, MD;  Location: [facility];  Service: Endoscopy;  Laterality: N/A;  . ESOPHAGOGASTRODUODENOSCOPY    . ESOPHAGOGASTRODUODENOSCOPY (EGD) WITH PROPOFOL N/A 04/10/2015   Procedure: ESOPHAGOGASTRODUODENOSCOPY (EGD) WITH PROPOFOL;  Surgeon: Daneil Dolin, MD;  Location: [facility];  Service: Endoscopy;  Laterality: N/A;  . ESOPHAGOGASTRODUODENOSCOPY (EGD) WITH PROPOFOL N/A 12/06/2016   Procedure: ESOPHAGOGASTRODUODENOSCOPY (EGD) WITH PROPOFOL;  Surgeon: Danie Binder, MD;  [facility];  Service: Endoscopy;  Laterality: N/A;  . ESOPHAGOGASTRODUODENOSCOPY (EGD) WITH PROPOFOL N/A 04/27/2019   Procedure: ESOPHAGOGASTRODUODENOSCOPY (EGD) WITH PROPOFOL;  Surgeon: Rogene Houston, MD;  [facility];  Service: Endoscopy;  Laterality: N/A;  730  . ESOPHAGOGASTRODUODENOSCOPY (EGD) WITH PROPOFOL N/A 08/31/2019   Procedure: ESOPHAGOGASTRODUODENOSCOPY (EGD) WITH PROPOFOL;  Surgeon: Rogene Houston, MD;  [facility];  Service: Endoscopy;  Laterality: N/A;  . GAB  2007   in [place]-POUCH 5 CM  . GASTRIC BYPASS  06/2006  . HYSTEROSCOPY  WITH D & C N/A 09/12/2014   Procedure: DILATATION AND CURETTAGE /HYSTEROSCOPY;  Surgeon: Jonnie Kind, MD;  Location: [facility];  Service: Gynecology;  Laterality: N/A;  . KYPHOPLASTY N/A 08/02/2018   Procedure: KYPHOPLASTY T12;  Surgeon: Melina Schools, MD;  Location: [place];  Service: Orthopedics;  Laterality: N/A;  60 mins  . LAPAROSCOPIC TUBAL LIGATION Bilateral 03/24/2017   Procedure: LAPAROSCOPIC TUBAL LIGATION ([place] Rings);  Surgeon: Jonnie Kind, MD;  Location: [facility];  Service: Gynecology;  Laterality: Bilateral;  . REPAIR VAGINAL CUFF N/A 07/30/2014   Procedure: REPAIR VAGINAL CUFF;  Surgeon: Mora Bellman, MD;  Location: [place] [facility];  Service: Gynecology;  Laterality: N/A;  . SAVORY DILATION  06/20/2012   Dr. Barnie Alderman gastritis/Ulcer in the mid jejunum. Empiric dilation.   . SURAL NERVE BX Left 02/25/2016  Procedure: LEFT SURAL NERVE BIOPSY;  Surgeon: Jovita Gamma, MD;  Location: [place] [facility];  Service: Neurosurgery;  Laterality: Left;  Left sural nerve biopsy  . TONSILLECTOMY    . TONSILLECTOMY AND ADENOIDECTOMY    . UPPER GASTROINTESTINAL ENDOSCOPY  JULY 2011 NAUSEA-D125,V6, PH 25   Bx; GASTRITIS, POUCH-5 CM LONG  . WISDOM TOOTH EXTRACTION       OB History    Gravida  3   Para  2   Term  1   Preterm  1   AB  1   Living  2     SAB  1   TAB      Ectopic      Multiple      Live Births  2           Family History  Problem Relation Age of Onset  . Hemochromatosis Maternal Grandmother   . Migraines Maternal Grandmother   . Cancer Maternal Grandmother   . Breast cancer Maternal Grandmother   . Hypertension Father   . Diabetes Father   . Coronary artery disease Father   . Migraines Paternal Grandmother   . Breast cancer Paternal Grandmother   . Cancer Mother        breast  . Hemochromatosis Mother   . Breast cancer Mother   . Depression Mother   . Anxiety disorder Mother   . Coronary artery disease Paternal Grandfather   . Anxiety disorder  Brother   . Bipolar disorder Brother   . Healthy Daughter   . Healthy Son     Social History   Tobacco Use  . Smoking status: Former Smoker    Packs/day: 0.25    Years: 6.00    Pack years: 1.50    Types: E-cigarettes    Start date: 05/2018  . Smokeless tobacco: Never Used  Vaping Use  . Vaping Use: Former  Substance Use Topics  . Alcohol use: Yes    Alcohol/week: 0.0 standard drinks    Comment: 1-2 beers a couple times a month   . Drug use: Not Currently    Types: Marijuana    Comment: Previously opioid addiction went through rehab    Home Medications Prior to Admission medications   Medication Sig Start Date End Date Taking? Authorizing Provider  albuterol (PROVENTIL) (2.5 MG/3ML) 0.083% nebulizer solution Take 3 mLs (2.5 mg total) by nebulization every 4 (four) hours as needed for wheezing. 10/08/19  Yes Kathyrn Drown, MD  baclofen 5 MG TABS Take 5 mg by mouth 3 (three) times daily as needed for muscle spasms. 06/23/20  Yes Connye Burkitt, NP  BELBUCA 75 MCG FILM Take 1 strip by mouth 2 (two) times daily. 06/11/20  Yes [provider]  budesonide-formoterol (SYMBICORT) 160-4.5 MCG/ACT inhaler Inhale 2 puffs into the lungs 2 (two) times daily. 05/23/20  Yes Valentina Shaggy, MD  busPIRone (BUSPAR) 5 MG tablet Take 1 tablet (5 mg total) by mouth 3 (three) times daily. 06/16/20  Yes Cloria Spring, MD  clonazePAM (KLONOPIN) 0.5 MG tablet Take 1 tablet (0.5 mg total) by mouth 2 (two) times daily as needed for anxiety. Patient taking differently: Take 0.5 mg by mouth 2 (two) times daily.  06/16/20 06/16/21 Yes Cloria Spring, MD  divalproex (DEPAKOTE) 500 MG DR tablet Take 1 tablet (500 mg total) by mouth every 12 (twelve) hours. 06/23/20  Yes Connye Burkitt, NP  DULoxetine (CYMBALTA) 60 MG capsule Take 1 capsule (60 mg total) by  mouth 2 (two) times daily. 06/16/20  Yes Cloria Spring, MD  HYDROmorphone (DILAUDID) 2 MG tablet Take 2 tablets (4 mg total) by mouth every 12  (twelve) hours as needed for severe pain. Take 1-2 tablets po q 4 hrs prn pain no greater than 9 tablets/day 06/23/20  Yes Connye Burkitt, NP  pantoprazole (PROTONIX) 40 MG tablet TAKE 1 TABLET BY MOUTH TWICE DAILY BEFORE MEALS. Patient taking differently: Take 40 mg by mouth 2 (two) times daily.  05/30/20  Yes Luking, Elayne Snare, MD  promethazine (PHENERGAN) 25 MG tablet TAKE (1) TABLET BY MOUTH TWICE A DAY AS NEEDED. Patient taking differently: Take 25 mg by mouth. TAKE (1) TABLET BY MOUTH TWICE A DAY AS NEEDED. 04/14/20  Yes Luking, Elayne Snare, MD  topiramate (TOPAMAX) 100 MG tablet Take 1 tablet (100 mg total) by mouth 2 (two) times daily. 06/16/20 06/16/21 Yes Cloria Spring, MD  traZODone (DESYREL) 150 MG tablet Take 1 tablet (150 mg total) by mouth at bedtime. 06/16/20  Yes Cloria Spring, MD    Allergies    Gabapentin, Metoclopramide hcl, Other, Tramadol, Ativan [lorazepam], Latex, Lyrica [pregabalin], Trazodone and nefazodone, Zofran [ondansetron], Iron, Sulfa antibiotics, Butrans [buprenorphine], Nucynta [tapentadol], Propofol, Tape, and Toradol [ketorolac tromethamine]  Review of Systems   Review of Systems  All other systems reviewed and are negative.   Physical Exam Updated Vital Signs BP 120/82 (BP Location: Right Arm)   Pulse 83   Temp 98.3 F (36.8 C) (Oral)   Resp 18   Ht 1.676 m ('5\' 6"'$ )   Wt 77.1 kg   SpO2 100%   BMI 27.44 kg/m   Physical Exam Vitals and nursing note reviewed.  Constitutional:      General: She is not in acute distress.    Appearance: She is well-developed.  HENT:     Head: Normocephalic and atraumatic.     Mouth/Throat:     Pharynx: No oropharyngeal exudate.  Eyes:     General: No scleral icterus.       Right eye: No discharge.        Left eye: No discharge.     Conjunctiva/sclera: Conjunctivae normal.     Pupils: Pupils are equal, round, and reactive to light.  Neck:     Thyroid: No thyromegaly.     Vascular: No JVD.  Cardiovascular:      Rate and Rhythm: Normal rate and regular rhythm.     Heart sounds: Normal heart sounds. No murmur heard.  No friction rub. No gallop.   Pulmonary:     Effort: Pulmonary effort is normal. No respiratory distress.     Breath sounds: Normal breath sounds. No wheezing or rales.  Abdominal:     General: Bowel sounds are normal. There is no distension.     Palpations: Abdomen is soft. There is no mass.     Tenderness: There is no abdominal tenderness.  Musculoskeletal:        General: Tenderness present. Normal range of motion.     Cervical back: Normal range of motion and neck supple.     Comments: Focal tenderness to palpation in the lumbar spine, this extends from approximately the sacrum up through the lower thoracic spine.  There is no tenderness above T10, there is no tenderness over the cervical spine, moves all 4 extremities without pain, she complains of back pain with moving any part of her body.  Lymphadenopathy:     Cervical: No cervical adenopathy.  Skin:  General: Skin is warm and dry.     Findings: No erythema or rash.  Neurological:     Mental Status: She is alert.     Coordination: Coordination normal.     Comments: Awake alert and able answer questions.  She is able to move all 4 extremities but virtually refuses to move anything stating that it hurts her back  Psychiatric:        Behavior: Behavior normal.     Comments: Mildly tearful     ED Results / Procedures / Treatments   Labs (all labs ordered are listed, but only abnormal results are displayed) Labs Reviewed - No data to display  EKG EKG Interpretation  Date/Time:  Wednesday June 25 2020 20:55:10 EDT Ventricular Rate:  54 PR Interval:    QRS Duration: 91 QT Interval:  463 QTC Calculation: 439 R Axis:   74 Text Interpretation: Sinus rhythm Anteroseptal infarct, old since last tracing no significant change Confirmed by Noemi Chapel [phone]) on 06/25/2020 9:19:08 PM   Radiology DG Lumbar Spine  Complete  Result Date: 06/25/2020 CLINICAL DATA:  Syncope, fell, lower back pain EXAM: LUMBAR SPINE - COMPLETE 4+ VIEW COMPARISON:  06/02/2018, 01/03/2020 FINDINGS: Frontal, bilateral oblique, lateral views of the lumbar spine are obtained. There are 5 non-rib-bearing lumbar type vertebral bodies. There is a new compression deformity involving the superior endplate of the L1 vertebral body, with approximately 25% loss of vertebral body height. No retropulsion. Chronic T12 compression deformity with post procedural changes from vertebral augmentation. Remaining lumbar vertebral bodies are unremarkable. Disc spaces are well preserved. Sacroiliac joints are normal. IMPRESSION: 1. Likely acute compression fracture involving the superior endplate of the L1 vertebral body, with approximately 25% loss of vertebral body height. 2. Chronic T12 compression deformity and vertebral augmentation. Electronically Signed   By: Randa Ngo M.D.   On: 06/25/2020 21:43    Procedures Procedures (including critical care time)  Medications Ordered in ED Medications  HYDROmorphone (DILAUDID) tablet 4 mg (4 mg Oral Given 06/25/20 2237)    ED Course  I have reviewed the triage vital signs and the nursing notes.  Pertinent labs & imaging results that were available during my care of the patient were reviewed by me and considered in my medical decision making (see chart for details).    MDM Rules/Calculators/A&P                          Denies suicidality, endorses chronic depression, the one question the patient has for me when I asked her if she has any questions is "what are you going to give me for pain".    She does not seem concerned about actually passing out, states she has been taking liquids without vomiting, she will get p.o. fluids, EKG and a lumbar spine x-ray.  X-ray of the lumbar spine does show a likely acute compression fracture of L1, the patient is neurologically intact.  There is a chronic T12  compression fracture.  TLSO brace has been ordered, the patient was given a single dose of medication.  I reviewed the patient's drug database and she does get hydromorphone filled every 2 weeks.  She most recently had this filled in the first week of July, she is also being prescribed buprenorphine.  I do not feel comfortable prescribing opiate medications for this patient, she can see her outpatient doctor for ongoing pain control  At change of shift care was signed out to Dr.  Knapp.  The patient has had some mild sinus bradycardia here, she has not been symptomatic with this, she has not had any syncopal episodes, she has not been hypotensive.  EKG otherwise reassuring  Final Clinical Impression(s) / ED Diagnoses Final diagnoses:  None    Rx / DC Orders ED Discharge Orders    None       Noemi Chapel, MD 06/25/20 2334

## 2020-06-25 NOTE — ED Notes (Signed)
Pt's heart rate in 30s, edp notified and ekg done.

## 2020-06-25 NOTE — Discharge Instructions (Addendum)
You have a small fracture of the L1 vertebrae in your back.  This is right beside the prior fracture.  I would recommend that you see the doctor that you saw before, if you cannot see them then you may see Dr. Saintclair Halsted who is on-call for spine tonight.  You will need to see your family doctor for pain control and medications since you are prescribed hydromorphone in the outpatient setting.  We will not prescribe opiate medications at this time.  You may take Tylenol or ibuprofen, please rest, please use the brace that we have ordered for you at all times when you are walking.  If you should develop severe or worsening symptoms such as numbness or weakness of the legs, or difficulty with urination or incontinence,  please return immediately

## 2020-06-26 ENCOUNTER — Emergency Department (HOSPITAL_COMMUNITY): Payer: Medicaid Other

## 2020-06-26 ENCOUNTER — Emergency Department (HOSPITAL_COMMUNITY)
Admission: EM | Admit: 2020-06-26 | Discharge: 2020-06-26 | Disposition: A | Discharge status: Home / Self Care | Payer: Medicaid Other | Source: Home / Self Care | Attending: Emergency Medicine | Admitting: Emergency Medicine

## 2020-06-26 ENCOUNTER — Encounter (HOSPITAL_COMMUNITY): Payer: Self-pay | Admitting: *Deleted

## 2020-06-26 ENCOUNTER — Other Ambulatory Visit: Payer: Self-pay

## 2020-06-26 DIAGNOSIS — W19XXXA Unspecified fall, initial encounter: Secondary | ICD-10-CM | POA: Diagnosis not present

## 2020-06-26 DIAGNOSIS — R200 Anesthesia of skin: Secondary | ICD-10-CM | POA: Diagnosis not present

## 2020-06-26 DIAGNOSIS — S32010A Wedge compression fracture of first lumbar vertebra, initial encounter for closed fracture: Secondary | ICD-10-CM | POA: Diagnosis not present

## 2020-06-26 DIAGNOSIS — Z91040 Latex allergy status: Secondary | ICD-10-CM | POA: Insufficient documentation

## 2020-06-26 DIAGNOSIS — M40209 Unspecified kyphosis, site unspecified: Secondary | ICD-10-CM | POA: Diagnosis not present

## 2020-06-26 DIAGNOSIS — Z87891 Personal history of nicotine dependence: Secondary | ICD-10-CM | POA: Insufficient documentation

## 2020-06-26 DIAGNOSIS — R404 Transient alteration of awareness: Secondary | ICD-10-CM | POA: Diagnosis not present

## 2020-06-26 DIAGNOSIS — S32040A Wedge compression fracture of fourth lumbar vertebra, initial encounter for closed fracture: Secondary | ICD-10-CM | POA: Diagnosis not present

## 2020-06-26 DIAGNOSIS — R001 Bradycardia, unspecified: Secondary | ICD-10-CM | POA: Diagnosis not present

## 2020-06-26 DIAGNOSIS — S32010D Wedge compression fracture of first lumbar vertebra, subsequent encounter for fracture with routine healing: Secondary | ICD-10-CM

## 2020-06-26 DIAGNOSIS — Z7951 Long term (current) use of inhaled steroids: Secondary | ICD-10-CM | POA: Insufficient documentation

## 2020-06-26 DIAGNOSIS — R410 Disorientation, unspecified: Secondary | ICD-10-CM | POA: Diagnosis not present

## 2020-06-26 DIAGNOSIS — R202 Paresthesia of skin: Secondary | ICD-10-CM | POA: Insufficient documentation

## 2020-06-26 DIAGNOSIS — R600 Localized edema: Secondary | ICD-10-CM | POA: Diagnosis not present

## 2020-06-26 LAB — BASIC METABOLIC PANEL
Anion gap: 9 (ref 5–15)
BUN: 9 mg/dL (ref 6–20)
CO2: 19 mmol/L — ABNORMAL LOW (ref 22–32)
Calcium: 8.9 mg/dL (ref 8.9–10.3)
Chloride: 109 mmol/L (ref 98–111)
Creatinine, Ser: 0.6 mg/dL (ref 0.44–1.00)
GFR calc Af Amer: >60 mL/min (ref >60)
GFR calc non Af Amer: >60 mL/min (ref >60)
Glucose, Bld: 129 mg/dL — ABNORMAL HIGH (ref 70–99)
Potassium: 3.9 mmol/L (ref 3.5–5.1)
Sodium: 137 mmol/L (ref 135–145)

## 2020-06-26 LAB — URINALYSIS, ROUTINE W REFLEX MICROSCOPIC
Bilirubin Urine: NEGATIVE
Glucose, UA: NEGATIVE mg/dL
Hgb urine dipstick: NEGATIVE
Ketones, ur: NEGATIVE mg/dL
Leukocytes,Ua: NEGATIVE
Nitrite: NEGATIVE
Protein, ur: NEGATIVE mg/dL
Specific Gravity, Urine: 1.017 (ref 1.005–1.030)
pH: 7 (ref 5.0–8.0)

## 2020-06-26 LAB — I-STAT BETA HCG BLOOD, ED (MC, WL, AP ONLY): I-stat hCG, quantitative: <5 m[IU]/mL (ref <5)

## 2020-06-26 LAB — CBC WITH DIFFERENTIAL/PLATELET
Abs Immature Granulocytes: 0.03 10*3/uL (ref 0.00–0.07)
Basophils Absolute: 0 10*3/uL (ref 0.0–0.1)
Basophils Relative: 0 %
Eosinophils Absolute: 0.1 10*3/uL (ref 0.0–0.5)
Eosinophils Relative: 1 %
HCT: 39.5 % (ref 36.0–46.0)
Hemoglobin: 13.4 g/dL (ref 12.0–15.0)
Immature Granulocytes: 0 %
Lymphocytes Relative: 19 %
Lymphs Abs: 2 10*3/uL (ref 0.7–4.0)
MCH: 32 pg (ref 26.0–34.0)
MCHC: 33.9 g/dL (ref 30.0–36.0)
MCV: 94.3 fL (ref 80.0–100.0)
Monocytes Absolute: 1.2 10*3/uL — ABNORMAL HIGH (ref 0.1–1.0)
Monocytes Relative: 11 %
Neutro Abs: 7.4 10*3/uL (ref 1.7–7.7)
Neutrophils Relative %: 69 %
Platelets: 230 10*3/uL (ref 150–400)
RBC: 4.19 MIL/uL (ref 3.87–5.11)
RDW: 12.3 % (ref 11.5–15.5)
WBC: 10.8 10*3/uL — ABNORMAL HIGH (ref 4.0–10.5)
nRBC: 0 % (ref 0.0–0.2)

## 2020-06-26 NOTE — ED Provider Notes (Signed)
[facility] Provider Note   CSN: [id number] Arrival date & time: 06/26/20  1510     History Chief Complaint  Patient presents with  . Numbness    bilateral lower legs    Penny [name] is a 34 y.o. female history of anxiety/depression, chronic pain, gastritis, IBS, obesity, pseudoseizures.  Patient presents today for concern of bilateral leg numbness and incontinence.  Patient was seen in the emergency department yesterday afternoon after she had a fall.  She had been feeling a lightheaded and had some diarrhea, she fell landing on her lower back.  She came to the emergency department for evaluation and was diagnosed with an acute compression fracture of L1.  She is neurologically intact, placed in a TLSO and was discharged with outpatient follow-up.  Incidentally patient noted to be bradycardic in the ER yesterday, not hypotensive or symptomatic.  Patient reports that after she left the emergency department around midnight last night she returned home and went to bed.  She has not had any new injuries.  She reports that she had one episode of urinary incontinence while sleeping last night.  She also reports that she has had increasing numbness to her bilateral legs.  She called EMS who brought her to the emergency department for evaluation.    It is noted that patient takes hydromorphone daily, she was drowsy with EMS and triage staff but arousable to voice.  Additionally noted to have bradycardia with rate of 36.  She denies any new injury, no fever or chills, no fecal incontinence, no abdominal pain nausea vomiting or diarrhea, no chest pain or shortness of breath, she denies any additional concerns. HPI     Past Medical History:  Diagnosis Date  . ADD (attention deficit disorder)   . Anemia 2011   2o to GASTRIC BYPASS  . Anginal pain ([place])   . Anxiety   . Blood transfusion without reported diagnosis   . Chronic daily headache   . Depression   .  Depression   . Dysmenorrhea 09/18/2013  . Dysrhythmia   . Elevated liver enzymes JUL 2011ALK PHOS 111-127 AST  143-267 ALT  213-321T BILI 0.6  ALB  3.7-4.06 Jun 2011 ALK PHOS 118 AST 24 ALT 42 T BILI 0.4 ALB 3.9  . Encounter for drug rehabilitation    at behavioral health for opioid addiction about 4 years ago  . Family history of adverse reaction to anesthesia    'dad had to be kept on pump for breathing for morphine'  . Fatty liver   . Fibroid 01/18/2017  . Fibromyalgia   . Gastric bypass status for obesity   . Gastritis JULY 2011  . Heart murmur   . 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 History of Holter monitoring   . Hx of opioid abuse ([place])    for about 4 years, about 4 years ago  . Interstitial cystitis   . Iron deficiency anemia 07/23/2010  . Irritable bowel syndrome 2012 DIARRHEA   JUN 2012 TTG IgA 14.9  . IUD FEB 2010  . Lupus ([place])   . Menorrhagia 07/18/2013  . Migraines   . Obesity (BMI 30-39.9) 2011 228 LBS BMI 36.8  . Ovarian cyst   . Patient desires pregnancy 09/18/2013  . Polyneuropathy   . PONV (postoperative nausea and vomiting) seizure post-operatively   seizure following ankle surgery  . Potassium (K) deficiency   . Pregnant 12/25/2013  . Psychiatric pseudoseizure   . RLQ  abdominal pain 07/18/2013  . Sciatica of left side 11/14/2014  . 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 Stress 09/18/2013    Patient Active Problem List   Diagnosis Date Noted  . Major depressive disorder, recurrent episode ([place]) 06/22/2020  . Major depression 06/19/2020  . Dehydration   . Intractable abdominal pain 09/19/2019  . Intractable nausea and vomiting 09/19/2019  . Gastroesophageal reflux disease without esophagitis   . Cannabis abuse 08/30/2019  . Dyspnea   . Severe anxiety 08/21/2019  . Hematemesis with nausea 04/23/2019  . Absolute anemia 04/23/2019  . Urge incontinence 10/26/2018  . S/P kyphoplasty 08/02/2018  . Herpes genitalis in  women 12/19/2017  . Cannabis use disorder, moderate, dependence ([place]) 07/07/2017  . Abnormal urine odor 03/30/2017  . Urinary tract infection with hematuria 03/30/2017  . Screening for genitourinary condition 03/30/2017  . Pain 03/30/2017  . Burning with urination 03/30/2017  . Family history of breast cancer in mother,uncertain BR CA status 02/28/2017  . Fibroid 01/18/2017  . Chronic pain syndrome 12/05/2016  . Folate deficiency 12/04/2016  . Nausea vomiting and diarrhea 12/04/2016  . Severe anemia 12/04/2016  . Symptomatic anemia 12/03/2016  . Anastomotic ulcer 04/22/2016  . Pain medication agreement signed 04/22/2016  . Complaints of leg weakness   . Paresthesia   . Left-sided weakness   . Uncontrolled pain 01/24/2016  . Malnutrition of moderate degree 01/24/2016  . Neuropathy 01/23/2016  . Inability to walk 01/23/2016  . Paresthesias   . Bilateral leg numbness 11/26/2015  . Major depressive disorder, recurrent episode, 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[place]) 05/10/2015  . Post partum depression 05/09/2015  . Dysphagia   . Hematemesis 04/07/2015  . Intractable vomiting with nausea 04/07/2015  . Elevated liver enzymes   . Epigastric pain   . Pseudoseizure ([place]) 02/22/2015  . Seizure-like activity ([place]) 02/22/2015  . Fibromyalgia 02/18/2015  . Vulvar fissure 02/18/2015  . Clinical depression 02/01/2015  . Current smoker 01/29/2015  . Current tobacco use 01/29/2015  . 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 Leg weakness, bilateral 01/02/2015  . Gait disorder 01/02/2015  . Other symptoms and signs involving the musculoskeletal system 01/02/2015  . Abnormal gait 01/02/2015  . Cervicalgia 12/18/2014  . Sciatica of left side 11/14/2014  . Neuralgia neuritis, sciatic nerve 11/14/2014  . 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 Encounter for sterilization 09/09/2014  . Abdominal pain, acute, right lower quadrant 08/22/2014  . Headache, migraine 08/19/2014  . Seizure  ([place]) 08/18/2014  . Encephalopathy 08/13/2014  . Headache 08/13/2014  . Altered mental status 08/13/2014  . Right leg numbness 07/31/2014  . Disturbance of skin sensation 07/31/2014  . Hypertension in pregnancy, transient 07/08/2014  . Previous gastric bypass affecting pregnancy, antepartum 05/22/2014  . History of bariatric surgery 05/22/2014  . 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 Antepartum mental disorder in pregnancy 05/09/2014  . Rapid palpitations 02/18/2014  . H/O maternal third degree perineal laceration, currently pregnant 02/18/2014  . High-risk pregnancy 02/18/2014  . Supervision of pregnancy with other poor reproductive or obstetric history, unspecified trimester 02/18/2014  . Restless legs 01/16/2014  . Restless leg 01/16/2014  . Chronic interstitial cystitis 10/23/2013  . Menorrhagia 07/18/2013  . Excessive and frequent menstruation 07/18/2013  . h/o Opiate addiction 03/11/2012    Class: Acute  . History of migraine headaches 03/10/2012    Class: Acute  . Depression with anxiety 03/10/2012    Class: Chronic  . Panic  disorder without agoraphobia with moderate panic attacks 03/10/2012    Class: Chronic  . Attention deficit hyperactivity disorder, 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    Class: Chronic  . Panic disorder without agoraphobia 03/10/2012  . H/O disease 03/10/2012  . Dysthymia 03/10/2012  . 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 Other specified behavioral and emotional disorders with onset usually occurring in childhood and adolescence 03/10/2012  . Pelvic congestion syndrome 10/13/2011  . Coitalgia 10/13/2011  . Chronic migraine without aura 10/13/2011  . Unspecified dyspareunia 10/13/2011  . Other specified conditions associated with female genital organs and menstrual cycle 10/13/2011  . IBS (irritable bowel syndrome) 08/25/2011  . Diarrhea 05/27/2011  .  OBESITY, UNSPECIFIED 09/17/2010  . Transaminitis 09/17/2010  . IDA (iron deficiency anemia) 07/23/2010    Past Surgical History:  Procedure Laterality Date  . BIOPSY  04/10/2015   Procedure: BIOPSY;  Surgeon: Daneil Dolin, MD;  Location: [facility];  Service: Endoscopy;;  . cath self every nite     for sodium bicarb injection (discontinued 2013)  . CHOLECYSTECTOMY  2005   biliary dyskinesia  . COLONOSCOPY  JUN 2012 ABD PN/DIARRHEA WITH PROPOFOL   NL COLON  . DILATION AND CURETTAGE OF UTERUS    . DILITATION & CURRETTAGE/HYSTROSCOPY WITH NOVASURE ABLATION N/A 03/24/2017   Procedure: DILATATION & CURETTAGE/HYSTEROSCOPY WITH NOVASURE ENDOMETRIAL ABLATION;  Surgeon: Jonnie Kind, MD;  Location: [facility];  Service: Gynecology;  Laterality: N/A;  . ESOPHAGEAL DILATION N/A 04/10/2015   Procedure: ESOPHAGEAL DILATION WITH 54FR MALONEY DILATOR;  Surgeon: Daneil Dolin, MD;  Location: [facility];  Service: Endoscopy;  Laterality: N/A;  . ESOPHAGOGASTRODUODENOSCOPY    . ESOPHAGOGASTRODUODENOSCOPY (EGD) WITH PROPOFOL N/A 04/10/2015   Procedure: ESOPHAGOGASTRODUODENOSCOPY (EGD) WITH PROPOFOL;  Surgeon: Daneil Dolin, MD;  Location: [facility];  Service: Endoscopy;  Laterality: N/A;  . ESOPHAGOGASTRODUODENOSCOPY (EGD) WITH PROPOFOL N/A 12/06/2016   Procedure: ESOPHAGOGASTRODUODENOSCOPY (EGD) WITH PROPOFOL;  Surgeon: Danie Binder, MD;  [facility];  Service: Endoscopy;  Laterality: N/A;  . ESOPHAGOGASTRODUODENOSCOPY (EGD) WITH PROPOFOL N/A 04/27/2019   Procedure: ESOPHAGOGASTRODUODENOSCOPY (EGD) WITH PROPOFOL;  Surgeon: Rogene Houston, MD;  [facility];  Service: Endoscopy;  Laterality: N/A;  730  . ESOPHAGOGASTRODUODENOSCOPY (EGD) WITH PROPOFOL N/A 08/31/2019   Procedure: ESOPHAGOGASTRODUODENOSCOPY (EGD) WITH PROPOFOL;  Surgeon: Rogene Houston, MD;  [facility];  Service: Endoscopy;  Laterality: N/A;  . GAB  2007   in [place]-POUCH 5 CM  . GASTRIC BYPASS  06/2006  .  HYSTEROSCOPY WITH D & C N/A 09/12/2014   Procedure: DILATATION AND CURETTAGE /HYSTEROSCOPY;  Surgeon: Jonnie Kind, MD;  Location: [facility];  Service: Gynecology;  Laterality: N/A;  . KYPHOPLASTY N/A 08/02/2018   Procedure: KYPHOPLASTY T12;  Surgeon: Melina Schools, MD;  Location: [facility];  Service: Orthopedics;  Laterality: N/A;  60 mins  . LAPAROSCOPIC TUBAL LIGATION Bilateral 03/24/2017   Procedure: LAPAROSCOPIC TUBAL LIGATION ([place] Rings);  Surgeon: Jonnie Kind, MD;  Location: [facility];  Service: Gynecology;  Laterality: Bilateral;  . REPAIR VAGINAL CUFF N/A 07/30/2014   Procedure: REPAIR VAGINAL CUFF;  Surgeon: Mora Bellman, MD;  Location: [facility];  Service: Gynecology;  Laterality: N/A;  . SAVORY DILATION  06/20/2012   Dr. Barnie Alderman gastritis/Ulcer in the mid jejunum. Empiric dilation.   . SURAL NERVE BX Left 02/25/2016   Procedure: LEFT SURAL NERVE BIOPSY;  Surgeon: Jovita Gamma, MD;  Location: [facility];  Service: Neurosurgery;  Laterality:  Left;  Left sural nerve biopsy  . TONSILLECTOMY    . TONSILLECTOMY AND ADENOIDECTOMY    . UPPER GASTROINTESTINAL ENDOSCOPY  JULY 2011 NAUSEA-D125,V6, PH 25   Bx; GASTRITIS, POUCH-5 CM LONG  . WISDOM TOOTH EXTRACTION       OB History    Gravida  3   Para  2   Term  1   Preterm  1   AB  1   Living  2     SAB  1   TAB      Ectopic      Multiple      Live Births  2           Family History  Problem Relation Age of Onset  . Hemochromatosis Maternal Grandmother   . Migraines Maternal Grandmother   . Cancer Maternal Grandmother   . Breast cancer Maternal Grandmother   . Hypertension Father   . Diabetes Father   . Coronary artery disease Father   . Migraines Paternal Grandmother   . Breast cancer Paternal Grandmother   . Cancer Mother        breast  . Hemochromatosis Mother   . Breast cancer Mother   . Depression Mother   . Anxiety disorder Mother   . Coronary artery disease Paternal Grandfather   . Anxiety  disorder Brother   . Bipolar disorder Brother   . Healthy Daughter   . Healthy Son     Social History   Tobacco Use  . Smoking status: Former Smoker    Packs/day: 0.25    Years: 6.00    Pack years: 1.50    Types: E-cigarettes    Start date: 05/2018  . Smokeless tobacco: Never Used  Vaping Use  . Vaping Use: Former  Substance Use Topics  . Alcohol use: Yes    Alcohol/week: 0.0 standard drinks    Comment: 1-2 beers a couple times a month   . Drug use: Not Currently    Types: Marijuana    Comment: Previously opioid addiction went through rehab    Home Medications Prior to Admission medications   Medication Sig Start Date End Date Taking? Authorizing Provider  albuterol (PROVENTIL) (2.5 MG/3ML) 0.083% nebulizer solution Take 3 mLs (2.5 mg total) by nebulization every 4 (four) hours as needed for wheezing. 10/08/19  Yes Babs Sciara, MD  baclofen 5 MG TABS Take 5 mg by mouth 3 (three) times daily as needed for muscle spasms. 06/23/20  Yes Aldean Baker, NP  BELBUCA 75 MCG FILM Take 1 strip by mouth 2 (two) times daily. 06/11/20  Yes [provider]  budesonide-formoterol (SYMBICORT) 160-4.5 MCG/ACT inhaler Inhale 2 puffs into the lungs 2 (two) times daily. 05/23/20  Yes Alfonse Spruce, MD  busPIRone (BUSPAR) 5 MG tablet Take 1 tablet (5 mg total) by mouth 3 (three) times daily. 06/16/20  Yes Myrlene Broker, MD  clonazePAM (KLONOPIN) 0.5 MG tablet Take 1 tablet (0.5 mg total) by mouth 2 (two) times daily as needed for anxiety. Patient taking differently: Take 0.5 mg by mouth 2 (two) times daily.  06/16/20 06/16/21 Yes Myrlene Broker, MD  divalproex (DEPAKOTE) 500 MG DR tablet Take 1 tablet (500 mg total) by mouth every 12 (twelve) hours. 06/23/20  Yes Aldean Baker, NP  DULoxetine (CYMBALTA) 60 MG capsule Take 1 capsule (60 mg total) by mouth 2 (two) times daily. 06/16/20  Yes Myrlene Broker, MD  HYDROmorphone (DILAUDID) 2 MG tablet Take 2  tablets (4 mg total) by mouth  every 12 (twelve) hours as needed for severe pain. Take 1-2 tablets po q 4 hrs prn pain no greater than 9 tablets/day 06/23/20  Yes Connye Burkitt, NP  pantoprazole (PROTONIX) 40 MG tablet TAKE 1 TABLET BY MOUTH TWICE DAILY BEFORE MEALS. Patient taking differently: Take 40 mg by mouth 2 (two) times daily.  05/30/20  Yes Luking, Elayne Snare, MD  promethazine (PHENERGAN) 25 MG tablet TAKE (1) TABLET BY MOUTH TWICE A DAY AS NEEDED. Patient taking differently: Take 25 mg by mouth 2 (two) times daily as needed. TAKE (1) TABLET BY MOUTH TWICE A DAY AS NEEDED. 04/14/20  Yes Luking, Elayne Snare, MD  topiramate (TOPAMAX) 100 MG tablet Take 1 tablet (100 mg total) by mouth 2 (two) times daily. 06/16/20 06/16/21 Yes Cloria Spring, MD  traZODone (DESYREL) 150 MG tablet Take 1 tablet (150 mg total) by mouth at bedtime. 06/16/20  Yes Cloria Spring, MD    Allergies    Gabapentin, Metoclopramide hcl, Other, Tramadol, Ativan [lorazepam], Latex, Lyrica [pregabalin], Trazodone and nefazodone, Zofran [ondansetron], Iron, Sulfa antibiotics, Butrans [buprenorphine], Nucynta [tapentadol], Propofol, Tape, and Toradol [ketorolac tromethamine]  Review of Systems   Review of Systems Ten systems are reviewed and are negative for acute change except as noted in the HPI  Physical Exam Updated Vital Signs BP (!) 134/91   Pulse (!) 38   Temp 98.4 F (36.9 C) (Oral)   Resp 15   Ht '5\' 6"'$  (1.676 m)   Wt 77.1 kg   SpO2 100%   BMI 27.44 kg/m   Physical Exam Constitutional:      General: She is not in acute distress.    Appearance: Normal appearance. She is well-developed. She is not ill-appearing or diaphoretic.  HENT:     Head: Normocephalic and atraumatic.  Eyes:     General: Vision grossly intact. Gaze aligned appropriately.     Pupils: Pupils are equal, round, and reactive to light.  Neck:     Trachea: Trachea and phonation normal.  Cardiovascular:     Rate and Rhythm: Regular rhythm. Bradycardia present.     Pulses:           Dorsalis pedis pulses are 2+ on the right side and 2+ on the left side.  Pulmonary:     Effort: Pulmonary effort is normal. No respiratory distress.  Abdominal:     General: There is no distension.     Palpations: Abdomen is soft.     Tenderness: There is no abdominal tenderness. There is no guarding or rebound.  Musculoskeletal:        General: Normal range of motion.     Cervical back: Normal range of motion.     Comments: No midline C/T/L spinal tenderness to palpation, no paraspinal muscle tenderness, no deformity, crepitus, or step-off noted.  Pelvis stable to compression bilaterally without pain.  Patient moving all 4 extremities spontaneously without difficulty.  She spontaneously rolled onto her left side without difficulty using her legs to push herself over.  Feet:     Right foot:     Protective Sensation: 5 sites tested. 5 sites sensed.     Left foot:     Protective Sensation: 5 sites tested. 5 sites sensed.  Skin:    General: Skin is warm and dry.  Neurological:     Mental Status: She is alert.     GCS: GCS eye subscore is 4. GCS verbal subscore is 5.  GCS motor subscore is 6.     Comments: Drowsy, speech is clear and goal oriented, follows commands Major Cranial nerves without deficit, no facial droop Normal strength in upper extremities including grip strength.  Equal strength with dorsi and plantar flexion however diminished strength possibly secondary to effort.  Sensation normal to light and sharp touch Moves extremities without ataxia, coordination intact  Psychiatric:        Behavior: Behavior normal.     ED Results / Procedures / Treatments   Labs (all labs ordered are listed, but only abnormal results are displayed) Labs Reviewed  CBC WITH DIFFERENTIAL/PLATELET  BASIC METABOLIC PANEL  URINALYSIS, ROUTINE W REFLEX MICROSCOPIC  I-STAT BETA HCG BLOOD, ED ([facility], [facility], [facility] ONLY)    EKG EKG Interpretation  Date/Time:  Thursday June 26 2020 15:25:25  EDT Ventricular Rate:  37 PR Interval:    QRS Duration: 90 QT Interval:  494 QTC Calculation: 388 R Axis:   60 Text Interpretation: Sinus bradycardia Confirmed by Virgel Manifold [phone]) on 06/26/2020 4:30:53 PM   Radiology DG Lumbar Spine Complete  Result Date: 06/25/2020 CLINICAL DATA:  Syncope, fell, lower back pain EXAM: LUMBAR SPINE - COMPLETE 4+ VIEW COMPARISON:  06/02/2018, 01/03/2020 FINDINGS: Frontal, bilateral oblique, lateral views of the lumbar spine are obtained. There are 5 non-rib-bearing lumbar type vertebral bodies. There is a new compression deformity involving the superior endplate of the L1 vertebral body, with approximately 25% loss of vertebral body height. No retropulsion. Chronic T12 compression deformity with post procedural changes from vertebral augmentation. Remaining lumbar vertebral bodies are unremarkable. Disc spaces are well preserved. Sacroiliac joints are normal. IMPRESSION: 1. Likely acute compression fracture involving the superior endplate of the L1 vertebral body, with approximately 25% loss of vertebral body height. 2. Chronic T12 compression deformity and vertebral augmentation. Electronically Signed   By: Randa Ngo M.D.   On: 06/25/2020 21:43    Procedures Procedures (including critical care time)  Medications Ordered in ED Medications - No data to display  ED Course  I have reviewed the triage vital signs and the nursing notes.  Pertinent labs & imaging results that were available during my care of the patient were reviewed by me and considered in my medical decision making (see chart for details).    MDM Rules/Calculators/A&P                          Additional history obtained from: 1. Nursing notes from this visit. 2. Electronic Medical Record, reviewed ER visit from yesterday. ------------------------- Patient's subjective complaints of bilateral lower extremity numbness in addition to an episode of incontinence this morning are  suggestive of cauda equina syndrome so MRI of the lumbar and thoracic spine were ordered.  However on examination she does have sensation to her toes and is moving her bilateral extremities when distracted.  She is able to push herself over using her legs and she is also able to pull an electrode off of her left leg by using the toes of her right foot during our conversations today.  She is very upset.  Plan of care is to obtain the MRI T/L, basic blood work and a urinalysis.  We will continue to monitor patient in the ER she is placed on cardiac monitoring given her bradycardia, asymptomatic at this time. Discussed case with Dr. Wilson Singer who agrees with imaging.  EKG: Sinus bradycardia Confirmed by Virgel Manifold [phone]) on 06/26/2020 4:30:53 PM - Care handoff given  to [name] Triplett PA-C at shift change.  MRIs blood work and urinalysis are pending.  Disposition per oncoming team.  Note: Portions of this report may have been transcribed using voice recognition software. Every effort was made to ensure accuracy; however, inadvertent computerized transcription errors may still be present. Final Clinical Impression(s) / ED Diagnoses Final diagnoses:  None    Rx / DC Orders ED Discharge Orders    None       Gari Crown 06/26/20 Yevette Edwards    Virgel Manifold, MD 06/28/20 2241

## 2020-06-26 NOTE — Discharge Instructions (Addendum)
Your MRIs this evening do not show evidence of compression or injury to your spinal cord.  As discussed, you have a compression fracture of your L1 vertebrae.  It is important that you wear the TLSO brace as recommended.  You were given referral information for Dr. Windy Carina office, call tomorrow to arrange follow-up appointment.

## 2020-06-26 NOTE — ED Notes (Signed)
Pt requesting pain medicine, spoke with PA about pt request. PA states to tell pt to take tylenol and ibuprofen. Pt made aware. Pt upset at time of discharge stating "I missed my appointment with pain management to come here, im in so much pain."  Pt states ems was supposed to bring her birkenstocks and her glasses. This nurse told her neither one was in the room, and im not sure if ems brought them or not but they are not in the room." Pt told this nurse to call or leave them with her name if they are found.

## 2020-06-26 NOTE — ED Triage Notes (Signed)
Pt c/o numbness from lower back down to her toes that started when she left [facility] last night. Pt reports fall that happened yesterday sustaining a partial L1 fracture. Pt was placed in a brace last night and sent home. Pt reports she was ambulatory when she left last night. Pt reports the numbness in her legs has been getting worse since she left last night. Pt reports urinary incontinence. EMS reports HR 36. EMS reports all other vitals WNL. EMS also states that pt takes Hydromorphone five times daily and the bottle was empty and it was filled at the beginning of the month. EMS reports pt is hard to arouse. Pt falls asleep during conversation, but easily arouses to verbal stimuli.

## 2020-06-26 NOTE — ED Provider Notes (Signed)
Patient signed out to me by Nuala Alpha, PA-C at end of shift pending completion of laboratory studies and MRI.  On recheck, patient sleeping, easily aroused.  Advised of lab results and MRI findings.  Patient was given TLSO brace yesterday, not wearing it today.  States she has it at home.  Advised to wear the brace as instructed and to arrange follow-up as previously discussed.  MRI of T and L-spine confirm acute L1 compression fracture, no spinal cord compression, conus and cauda equina appear normal  Patient has been bradycardic during ER stay.  Blood pressure good, no hypotension.  Heart rate 51 during my conversation with her.   Plan discharge, patient requesting pain medication.  Patient already takes Dilaudid.  I do not feel that additional narcotic pain medication is indicated at this time.  Advised patient that she may take Tylenol or ibuprofen if needed.    DG Lumbar Spine Complete  Result Date: 06/25/2020 CLINICAL DATA:  Syncope, fell, lower back pain EXAM: LUMBAR SPINE - COMPLETE 4+ VIEW COMPARISON:  06/02/2018, 01/03/2020 FINDINGS: Frontal, bilateral oblique, lateral views of the lumbar spine are obtained. There are 5 non-rib-bearing lumbar type vertebral bodies. There is a new compression deformity involving the superior endplate of the L1 vertebral body, with approximately 25% loss of vertebral body height. No retropulsion. Chronic T12 compression deformity with post procedural changes from vertebral augmentation. Remaining lumbar vertebral bodies are unremarkable. Disc spaces are well preserved. Sacroiliac joints are normal. IMPRESSION: 1. Likely acute compression fracture involving the superior endplate of the L1 vertebral body, with approximately 25% loss of vertebral body height. 2. Chronic T12 compression deformity and vertebral augmentation. Electronically Signed   By: Randa Ngo M.D.   On: 06/25/2020 21:43   MR THORACIC SPINE WO CONTRAST  Result Date:  06/26/2020 CLINICAL DATA:  L1 fracture, lower back numbness extending into extremities EXAM: MRI THORACIC AND LUMBAR SPINE WITHOUT CONTRAST TECHNIQUE: Multiplanar and multiecho pulse sequences of the thoracic and lumbar spine were obtained without intravenous contrast. COMPARISON:  None. FINDINGS: MRI THORACIC SPINE Alignment:  Preserved kyphosis. Vertebrae: Chronic mild compression deformity of T12 with cement augmentation. Chronic minimal loss of height at the superior endplate of T8. Thoracic vertebral body heights are otherwise maintained. There is no marrow edema. Probable small hemangioma of the T6 vertebral body. Cord:  No abnormal signal. Paraspinal and other soft tissues: Unremarkable. Disc levels: Intervertebral disc heights and signal are maintained. There is no degenerative stenosis. MRI LUMBAR SPINE Significant motion artifact is present. Below findings are within this limitation. Segmentation: Standard. Alignment:  Preserved lordosis and anteroposterior alignment. Vertebrae: There is a fracture line through the mid L1 vertebral body likely involving anterior and posterior margins and likely the superior endplate where there is mild, less than 50% loss of height. Marrow edema does not definitely extend into the posterior elements. There is minor osseous retropulsion. Lumbar vertebral body heights and signal are otherwise maintained. Conus medullaris and cauda equina: Conus extends to the T12-L1 level. Conus and cauda equina appear normal. Paraspinal and other soft tissues: Unremarkable. Disc levels: Disc desiccation at L3-L4 and L4-L5. Possible mild disc bulges at these levels. No significant degenerative stenosis identified. IMPRESSION: Lumbar spine imaging is significantly motion degraded. Acute L1 fracture likely involving anterior and posterior margins as well as the superior endplate where there is less than 50% loss of height. Marrow edema does not definitely extend to the posterior elements but  involvement by fracture is difficult to exclude on MRI. Minor  osseous retropulsion. Chronic thoracic findings detailed above. Electronically Signed   By: Macy Mis M.D.   On: 06/26/2020 18:24   MR LUMBAR SPINE WO CONTRAST  Result Date: 06/26/2020 CLINICAL DATA:  L1 fracture, lower back numbness extending into extremities EXAM: MRI THORACIC AND LUMBAR SPINE WITHOUT CONTRAST TECHNIQUE: Multiplanar and multiecho pulse sequences of the thoracic and lumbar spine were obtained without intravenous contrast. COMPARISON:  None. FINDINGS: MRI THORACIC SPINE Alignment:  Preserved kyphosis. Vertebrae: Chronic mild compression deformity of T12 with cement augmentation. Chronic minimal loss of height at the superior endplate of T8. Thoracic vertebral body heights are otherwise maintained. There is no marrow edema. Probable small hemangioma of the T6 vertebral body. Cord:  No abnormal signal. Paraspinal and other soft tissues: Unremarkable. Disc levels: Intervertebral disc heights and signal are maintained. There is no degenerative stenosis. MRI LUMBAR SPINE Significant motion artifact is present. Below findings are within this limitation. Segmentation: Standard. Alignment:  Preserved lordosis and anteroposterior alignment. Vertebrae: There is a fracture line through the mid L1 vertebral body likely involving anterior and posterior margins and likely the superior endplate where there is mild, less than 50% loss of height. Marrow edema does not definitely extend into the posterior elements. There is minor osseous retropulsion. Lumbar vertebral body heights and signal are otherwise maintained. Conus medullaris and cauda equina: Conus extends to the T12-L1 level. Conus and cauda equina appear normal. Paraspinal and other soft tissues: Unremarkable. Disc levels: Disc desiccation at L3-L4 and L4-L5. Possible mild disc bulges at these levels. No significant degenerative stenosis identified. IMPRESSION: Lumbar spine imaging is  significantly motion degraded. Acute L1 fracture likely involving anterior and posterior margins as well as the superior endplate where there is less than 50% loss of height. Marrow edema does not definitely extend to the posterior elements but involvement by fracture is difficult to exclude on MRI. Minor osseous retropulsion. Chronic thoracic findings detailed above. Electronically Signed   By: Macy Mis M.D.   On: 06/26/2020 18:24        Kem Parkinson, PA-C 06/26/20 2246    Virgel Manifold, MD 06/28/20 2241

## 2020-06-27 ENCOUNTER — Other Ambulatory Visit: Payer: Self-pay | Admitting: Family Medicine

## 2020-06-27 ENCOUNTER — Telehealth: Payer: Self-pay | Admitting: Family Medicine

## 2020-06-27 ENCOUNTER — Telehealth: Payer: Self-pay | Admitting: *Deleted

## 2020-06-27 DIAGNOSIS — S32010A Wedge compression fracture of first lumbar vertebra, initial encounter for closed fracture: Secondary | ICD-10-CM

## 2020-06-27 MED ORDER — HYDROCODONE-ACETAMINOPHEN 7.5-325 MG PO TABS: 1.0000 | ORAL_TABLET | Freq: Four times a day (QID) | ORAL | 0 refills | Status: AC | PRN

## 2020-06-27 NOTE — Telephone Encounter (Signed)
Nurses Please communicate with patient/family  The patient is on some very strong pain medicines that is outside of my prescribing criteria I can prescribe a few hydrocodone's that they could use sparingly for pain dispensed by family member preferably mother If they prefer a few sent in please let me know  I highly recommended the patient follow-up with the specialist on Wednesday As for passing out if this should recur I recommend going back to the ER immediately for safety  I also recommend setting up the office visit-(more than likely this office visit would be very complex and I would prefer not to be at the end of the afternoon) this could be done next week at 11 AM or 11:30 AM Thursday or Friday or the following week (I will be out of the office Monday Tuesday Wednesday)

## 2020-06-27 NOTE — Telephone Encounter (Signed)
Discussed with pt's mother Santiago Glad and she verbalized understanding. She would like hyrdocodone sent to [place] and states she will administer it to her. She did schedule visit for next week. Santiago Glad notified med would be sent in today. [place] apoth.

## 2020-06-27 NOTE — Telephone Encounter (Signed)
Please advise. Thank you

## 2020-06-27 NOTE — Telephone Encounter (Signed)
Pt mother called and she blacked out the other night she fractured the L1 the ER at [facility] sent her to a Back Specialist in [place] and she can not get in till next Wed. She is wetting the bed and her mother wanting what to do.  Pt Call back [phone] Santiago Glad

## 2020-06-27 NOTE — Telephone Encounter (Signed)
Nurses Please talk with family-please get a feel for what is going on and how we can help The patient has a compression fracture of 50% Certainly can cause pain.  It is recommended to try to get up out of bed though to lessen the risk of blood clots Bedside commode can be ordered if they would like this Utilizing a Velcro lumbar back brace might help some Patient is on prescription pain medicine through her pain management doctor

## 2020-06-27 NOTE — Telephone Encounter (Signed)
Pt to [facility] 7/21 and 7/22   Transition Care Management Follow-up Telephone Call   [facility] Managed Care Transition Call Status:MM [facility] Call Made   Date of discharge and from where: [facility], 06/25/20 & 06/26/20   How have you been since you were released from the hospital? "horrible"   Any questions or concerns? Not sure how to make follow up appt since she is under workman's comp; pt still having pain and missed pain management appt because she was in [facility]  Items Reviewed:  Did the pt receive and understand the discharge instructions provided? Yes   Medications obtained and verified?   Any new allergies since your discharge? No   Dietary orders reviewed? yes  Do you have support at home? yes Functional Questionnaire: (I = Independent and D = Dependent)  ADLs: Dependent Bathing/Dressing:Dependent Meal Prep: Dependent Eating: Dependent Maintaining continence: Dependent Transferring/Ambulation: Dependent Managing Meds: Dependent Follow up appointments reviewed:  PCP Hospital f/u appt confirmed? No   Pt to call Dr Wolfgang Phoenix on 06/27/20 to schedule follow up appt;  Specialist Hospital f/u appt confirmed? No pt states she tried to make appt with Neuro, but there are issues on how she will be billed because she is under workmen's comp  Are transportation arrangements needed? No   If their condition worsens, is the pt aware to call PCP or go to the EmergencyDept.? Yes  Was the patient provided with contact information for the PCP's office or ED? yes  Was to pt encouraged to call back with questions or concerns? Yes  Order placed for community coordination of care.  Lenor Coffin, RN, BSN, [facility] Patient [place] [phone]

## 2020-06-27 NOTE — Telephone Encounter (Signed)
Mother states no one has tried to figure out why she blacked out. Took to er on Wednesday. Then yesterday she wet herself and said she could not feel her leg. Dad david called 911. States her heart rate was 30 when she got to hospital. No pain med for 10 days because she missed pain management appt due to fall. Mom is concerned about why she fell and blacked out. Wants to know what they should do before her appt next Wednesday with neurosurgeon. She states she was told she was dehyrdated but states she drunk two 32 oz of water that day before blacking out. Mom states going to ER does not work and would like dr Nicki Reaper to see if he can figure out why she blacked out and would like something short term for pain til seen by specialist on Wednesday. States she cannot take tylenol or ibuprofen due to ulcers. Mom states noone at ED address why her heart rate was 30. States she is willing to do virtual visit it dr scott wants to.   [place] apoth.

## 2020-06-27 NOTE — Telephone Encounter (Signed)
Hydrocodone was sent in thank you

## 2020-06-27 NOTE — Telephone Encounter (Signed)
According to the other phone note the patient will be doing a follow-up visit with back specialist next week

## 2020-06-27 NOTE — Telephone Encounter (Signed)
See other message from today

## 2020-06-30 DIAGNOSIS — Z79899 Other long term (current) drug therapy: Secondary | ICD-10-CM | POA: Diagnosis not present

## 2020-06-30 DIAGNOSIS — M5441 Lumbago with sciatica, right side: Secondary | ICD-10-CM | POA: Diagnosis not present

## 2020-06-30 DIAGNOSIS — M5442 Lumbago with sciatica, left side: Secondary | ICD-10-CM | POA: Diagnosis not present

## 2020-06-30 DIAGNOSIS — G8929 Other chronic pain: Secondary | ICD-10-CM | POA: Diagnosis not present

## 2020-07-03 ENCOUNTER — Ambulatory Visit (INDEPENDENT_AMBULATORY_CARE_PROVIDER_SITE_OTHER): Payer: Medicaid Other | Admitting: Family Medicine

## 2020-07-03 ENCOUNTER — Ambulatory Visit (HOSPITAL_COMMUNITY)
Admission: RE | Admit: 2020-07-03 | Discharge: 2020-07-03 | Disposition: A | Discharge status: Home / Self Care | Payer: Medicaid Other | Source: Ambulatory Visit | Attending: Family Medicine | Admitting: Family Medicine

## 2020-07-03 ENCOUNTER — Encounter: Payer: Self-pay | Admitting: Family Medicine

## 2020-07-03 ENCOUNTER — Other Ambulatory Visit: Payer: Self-pay

## 2020-07-03 VITALS — BP 108/70 | HR 75 | Temp 98.0°F | Ht 66.0 in | Wt 177.8 lb

## 2020-07-03 DIAGNOSIS — R197 Diarrhea, unspecified: Secondary | ICD-10-CM

## 2020-07-03 DIAGNOSIS — R001 Bradycardia, unspecified: Secondary | ICD-10-CM

## 2020-07-03 DIAGNOSIS — R112 Nausea with vomiting, unspecified: Secondary | ICD-10-CM | POA: Diagnosis not present

## 2020-07-03 DIAGNOSIS — Z9889 Other specified postprocedural states: Secondary | ICD-10-CM | POA: Diagnosis not present

## 2020-07-03 NOTE — Progress Notes (Signed)
Subjective:    Patient ID: Penny [name], female    DOB: 16-Dec-1985, 34 y.o.   MRN: [id number]  HPIpt arrives for a hospital follow up.  This patient was seen in the ER She had passed out fall and struck her back Was evaluated had a compression fracture Saw neurosurgery that did not recommend kyphoplasty Patient does not know why she passed out Her heart rate was in the 30s when she was in the ER where typically it is in the 60s 70s or 80s Patient denies chest tightness pressure pain shortness of breath She does relate getting dizzy when she stands for any significant length of time She is on multiple medications for psychiatric and pain Her mom relates that she seems drowsy at times.  Patient also relates severe diarrhea spells which occur frequently she states she is being followed by gastroenterology at [facility] her stool studies in the ER were negative  Review of Systems  Constitutional: Negative for activity change, appetite change and fatigue.  HENT: Negative for congestion and rhinorrhea.   Respiratory: Negative for cough and shortness of breath.   Cardiovascular: Negative for chest pain and leg swelling.  Gastrointestinal: Negative for abdominal pain and diarrhea.  Endocrine: Negative for polydipsia and polyphagia.  Musculoskeletal: Positive for back pain. Negative for arthralgias.  Skin: Negative for color change.  Neurological: Positive for syncope. Negative for dizziness and weakness.  Psychiatric/Behavioral: Negative for behavioral problems and confusion.       Objective:   Physical Exam Vitals reviewed.  Constitutional:      General: She is not in acute distress. HENT:     Head: Normocephalic and atraumatic.  Eyes:     General:        Right eye: No discharge.        Left eye: No discharge.  Neck:     Trachea: No tracheal deviation.  Cardiovascular:     Rate and Rhythm: Normal rate and regular rhythm.     Heart sounds: Normal heart sounds. No murmur heard.    Pulmonary:     Effort: Pulmonary effort is normal. No respiratory distress.     Breath sounds: Normal breath sounds.  Lymphadenopathy:     Cervical: No cervical adenopathy.  Skin:    General: Skin is warm and dry.  Neurological:     Mental Status: She is alert.     Coordination: Coordination normal.  Psychiatric:        Behavior: Behavior normal.           Assessment & Plan:  Bradycardia with syncope When patient went to the ER her heart rate was in the mid 30s.  On other visits to the ER it was in the 25s.  And today it is in the mid 56s.  Patient denies palpitations. Concerning for possibility of bradycardia related syncope Recommend consultation with cardiology.  Patient would benefit from telemetry  Spinal fracture patient states she is taking half of the pain pill when she takes it and taking it on a regular basis she denies that it is causing her to feel drowsy or dopey  I encouraged the patient to have a discussion with her pain medicine doctor regarding being on a small amount as possible to control her pain  Patient sees psychiatry on a regular basis Family is concerned that her medications may be too strong They state that she has intermittent spells where she seems to be fatigued or confused  Patient also with diarrhea issues but  also has history of severe constipation it is possible this could be stool leakage around the bowel stent KUB ordered

## 2020-07-04 DIAGNOSIS — R197 Diarrhea, unspecified: Secondary | ICD-10-CM | POA: Diagnosis not present

## 2020-07-04 DIAGNOSIS — R112 Nausea with vomiting, unspecified: Secondary | ICD-10-CM | POA: Diagnosis not present

## 2020-07-07 ENCOUNTER — Other Ambulatory Visit: Payer: Self-pay

## 2020-07-07 LAB — BASIC METABOLIC PANEL
BUN/Creatinine Ratio: 19 (ref 9–23)
BUN: 14 mg/dL (ref 6–20)
CO2: 26 mmol/L (ref 20–29)
Calcium: 9.1 mg/dL (ref 8.7–10.2)
Chloride: 102 mmol/L (ref 96–106)
Creatinine, Ser: 0.73 mg/dL (ref 0.57–1.00)
GFR calc Af Amer: 126 mL/min/{1.73_m2} (ref >59)
GFR calc non Af Amer: 109 mL/min/{1.73_m2} (ref >59)
Glucose: 105 mg/dL — ABNORMAL HIGH (ref 65–99)
Potassium: 4.1 mmol/L (ref 3.5–5.2)
Sodium: 142 mmol/L (ref 134–144)

## 2020-07-07 LAB — TISSUE TRANSGLUTAMINASE, IGA: Transglutaminase IgA: <2 U/mL (ref 0–3)

## 2020-07-07 LAB — C-REACTIVE PROTEIN: CRP: 62 mg/L — ABNORMAL HIGH (ref 0–10)

## 2020-07-07 NOTE — Patient Outreach (Signed)
Care Coordination  07/07/2020  Penny [name] 1986/08/15 [id number]    Managed Medicaid:  Reviewed medical record. Placed call to patient No answer and Voicemail was full.   PLAN: will mail outreach letter and attempt again in 3 days.   Tomasa Rand, RN, BSN, [facility] [employer] [phone]

## 2020-07-09 ENCOUNTER — Telehealth (INDEPENDENT_AMBULATORY_CARE_PROVIDER_SITE_OTHER): Payer: Medicaid Other | Admitting: Psychiatry

## 2020-07-09 ENCOUNTER — Other Ambulatory Visit: Payer: Self-pay

## 2020-07-09 ENCOUNTER — Encounter (HOSPITAL_COMMUNITY): Payer: Self-pay | Admitting: Psychiatry

## 2020-07-09 DIAGNOSIS — F332 Major depressive disorder, recurrent severe without psychotic features: Secondary | ICD-10-CM | POA: Diagnosis not present

## 2020-07-09 DIAGNOSIS — F411 Generalized anxiety disorder: Secondary | ICD-10-CM | POA: Diagnosis not present

## 2020-07-09 MED ORDER — DIVALPROEX SODIUM 500 MG PO DR TAB: 500.0000 mg | DELAYED_RELEASE_TABLET | Freq: Two times a day (BID) | ORAL | 0 refills | Status: DC

## 2020-07-09 MED ORDER — BUSPIRONE HCL 5 MG PO TABS: 5.0000 mg | ORAL_TABLET | Freq: Three times a day (TID) | ORAL | 2 refills | Status: DC

## 2020-07-09 MED ORDER — CLONAZEPAM 0.5 MG PO TABS: 0.5000 mg | ORAL_TABLET | Freq: Two times a day (BID) | ORAL | 2 refills | Status: DC

## 2020-07-09 MED ORDER — DULOXETINE HCL 60 MG PO CPEP: 60.0000 mg | ORAL_CAPSULE | Freq: Two times a day (BID) | ORAL | 2 refills | Status: DC

## 2020-07-09 MED ORDER — TRAZODONE HCL 150 MG PO TABS: 150.0000 mg | ORAL_TABLET | Freq: Every day | ORAL | 2 refills | Status: DC

## 2020-07-09 NOTE — Progress Notes (Signed)
Virtual Visit via Video Note  I connected with Penny [name] on 07/09/20 at  2:10 PM EDT by a video enabled telemedicine application and verified that I am speaking with the correct person using two identifiers.   I discussed the limitations of evaluation and management by telemedicine and the availability of in person appointments. The patient expressed understanding and agreed to proceed    I discussed the assessment and treatment plan with the patient. The patient was provided an opportunity to ask questions and all were answered. The patient agreed with the plan and demonstrated an understanding of the instructions.   The patient was advised to call back or seek an in-person evaluation if the symptoms worsen or if the condition fails to improve as anticipated.  I provided 15 minutes of non-face-to-face time during this encounter. Location: Provider Home, patient home  Penny Spiller, MD  [facility] MD/PA/NP OP Progress Note  07/09/2020 2:47 PM Penny [name]  MRN:  [id number]  Chief Complaint:  Chief Complaint    Anxiety; Depression; Follow-up     HPI: This patient is a 34 year old female who is living with her boyfriend her daughter and son as well as her boyfriend's 2 daughters in [place].  She is currently not working and is applying for disability.  The patient returns for follow-up today after 3 weeks.  I last saw her on 06/16/2020 and she was admitted to the behavioral health hospital 3 days later.  She had told him that she felt like she was at the end of her rope and her mother told the people in the ER that she could no longer function and was not not getting out of bed or eating.  She states that she had severe diarrhea and could not function.  While in the hospital she seemed to be doing better with all the support and groups.  Her medications were not considerably changed her.  Her Depakote level was 66 and so was continued as before.  The Adderall was discontinued.  Since getting  home from the hospital the patient has been back in the emergency room after she had a new spinal fracture and also developed bradycardia.  She is now on bedrest.  She states that the conflict with her daughter's father is is getting somewhat better.  However now he does not want the daughter who is 18 years old to stay with the patient because he is worried about her physical and mental health.  Right now the patient states she is feeling somewhat better and denies thoughts of self-harm or or suicidal ideation.  She denies severe anxiety or panic attacks.  She is getting along better with her fianc and the children are getting on along better with each other by her report.  She asked me about the Topamax and I explained that I would rather not keep prescribing this because we have little use more than psychiatry and this needs to be taken over by her primary care provider. Visit Diagnosis:    ICD-10-CM   1. GAD (generalized anxiety disorder)  F41.1   2. Severe episode of recurrent major depressive disorder, without psychotic features ([place])  F33.2     Past Psychiatric History: Psychiatric hospitalization 2 years ago for depression and again in the last month  Past Medical History:  Past Medical History:  Diagnosis Date  . ADD (attention deficit disorder)   . Anemia 2011   2o to GASTRIC BYPASS  . Anginal pain ([place])   . Anxiety   .  Blood transfusion without reported diagnosis   . Chronic daily headache   . Depression   . Depression   . Dysmenorrhea 09/18/2013  . Dysrhythmia   . Elevated liver enzymes JUL 2011ALK PHOS 111-127 AST  143-267 ALT  213-321T BILI 0.6  ALB  3.7-4.06 Jun 2011 ALK PHOS 118 AST 24 ALT 42 T BILI 0.4 ALB 3.9  . Encounter for drug rehabilitation    at behavioral health for opioid addiction about 4 years ago  . Family history of adverse reaction to anesthesia    'dad had to be kept on pump for breathing for morphine'  . Fatty liver   . Fibroid 01/18/2017  .  Fibromyalgia   . Gastric bypass status for obesity   . Gastritis JULY 2011  . Heart murmur   . 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 History of Holter monitoring   . Hx of opioid abuse (HCC)    for about 4 years, about 4 years ago  . Interstitial cystitis   . Iron deficiency anemia 07/23/2010  . Irritable bowel syndrome 2012 DIARRHEA   JUN 2012 TTG IgA 14.9  . IUD FEB 2010  . Lupus (HCC)   . Menorrhagia 07/18/2013  . Migraines   . Obesity (BMI 30-39.9) 2011 228 LBS BMI 36.8  . Ovarian cyst   . Patient desires pregnancy 09/18/2013  . Polyneuropathy   . PONV (postoperative nausea and vomiting) seizure post-operatively   seizure following ankle surgery  . Potassium (K) deficiency   . Pregnant 12/25/2013  . Psychiatric pseudoseizure   . RLQ abdominal pain 07/18/2013  . Sciatica of left side 11/14/2014  . 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 Stress 09/18/2013    Past Surgical History:  Procedure Laterality Date  . BIOPSY  04/10/2015   Procedure: BIOPSY;  Surgeon: Corbin Ade, MD;  Location: [facility];  Service: Endoscopy;;  . cath self every nite     for sodium bicarb injection (discontinued 2013)  . CHOLECYSTECTOMY  2005   biliary dyskinesia  . COLONOSCOPY  JUN 2012 ABD PN/DIARRHEA WITH PROPOFOL   NL COLON  . DILATION AND CURETTAGE OF UTERUS    . DILITATION & CURRETTAGE/HYSTROSCOPY WITH NOVASURE ABLATION N/A 03/24/2017   Procedure: DILATATION & CURETTAGE/HYSTEROSCOPY WITH NOVASURE ENDOMETRIAL ABLATION;  Surgeon: Tilda Burrow, MD;  Location: [facility];  Service: Gynecology;  Laterality: N/A;  . ESOPHAGEAL DILATION N/A 04/10/2015   Procedure: ESOPHAGEAL DILATION WITH 54FR MALONEY DILATOR;  Surgeon: Corbin Ade, MD;  Location: [facility];  Service: Endoscopy;  Laterality: N/A;  . ESOPHAGOGASTRODUODENOSCOPY    . ESOPHAGOGASTRODUODENOSCOPY (EGD) WITH PROPOFOL N/A 04/10/2015   Procedure: ESOPHAGOGASTRODUODENOSCOPY (EGD) WITH PROPOFOL;  Surgeon: Corbin Ade,  MD;  Location: [facility];  Service: Endoscopy;  Laterality: N/A;  . ESOPHAGOGASTRODUODENOSCOPY (EGD) WITH PROPOFOL N/A 12/06/2016   Procedure: ESOPHAGOGASTRODUODENOSCOPY (EGD) WITH PROPOFOL;  Surgeon: West Bali, MD;  [facility];  Service: Endoscopy;  Laterality: N/A;  . ESOPHAGOGASTRODUODENOSCOPY (EGD) WITH PROPOFOL N/A 04/27/2019   Procedure: ESOPHAGOGASTRODUODENOSCOPY (EGD) WITH PROPOFOL;  Surgeon: Malissa Hippo, MD;  [facility];  Service: Endoscopy;  Laterality: N/A;  730  . ESOPHAGOGASTRODUODENOSCOPY (EGD) WITH PROPOFOL N/A 08/31/2019   Procedure: ESOPHAGOGASTRODUODENOSCOPY (EGD) WITH PROPOFOL;  Surgeon: Malissa Hippo, MD;  [facility];  Service: Endoscopy;  Laterality: N/A;  . GAB  2007   in [place]-POUCH 5 CM  . GASTRIC BYPASS  06/2006  . HYSTEROSCOPY WITH  D & C N/A 09/12/2014   Procedure: DILATATION AND CURETTAGE /HYSTEROSCOPY;  Surgeon: Jonnie Kind, MD;  Location: [facility];  Service: Gynecology;  Laterality: N/A;  . KYPHOPLASTY N/A 08/02/2018   Procedure: KYPHOPLASTY T12;  Surgeon: Melina Schools, MD;  Location: [facility];  Service: Orthopedics;  Laterality: N/A;  60 mins  . LAPAROSCOPIC TUBAL LIGATION Bilateral 03/24/2017   Procedure: LAPAROSCOPIC TUBAL LIGATION ([place] Rings);  Surgeon: Jonnie Kind, MD;  Location: [facility];  Service: Gynecology;  Laterality: Bilateral;  . REPAIR VAGINAL CUFF N/A 07/30/2014   Procedure: REPAIR VAGINAL CUFF;  Surgeon: Mora Bellman, MD;  Location: [facility];  Service: Gynecology;  Laterality: N/A;  . SAVORY DILATION  06/20/2012   Dr. Barnie Alderman gastritis/Ulcer in the mid jejunum. Empiric dilation.   . SURAL NERVE BX Left 02/25/2016   Procedure: LEFT SURAL NERVE BIOPSY;  Surgeon: Jovita Gamma, MD;  Location: [place] [facility];  Service: Neurosurgery;  Laterality: Left;  Left sural nerve biopsy  . TONSILLECTOMY    . TONSILLECTOMY AND ADENOIDECTOMY    . UPPER GASTROINTESTINAL ENDOSCOPY  JULY 2011 NAUSEA-D125,V6, PH 25    Bx; GASTRITIS, POUCH-5 CM LONG  . WISDOM TOOTH EXTRACTION      Family Psychiatric History: See below  Family History:  Family History  Problem Relation Age of Onset  . Hemochromatosis Maternal Grandmother   . Migraines Maternal Grandmother   . Cancer Maternal Grandmother   . Breast cancer Maternal Grandmother   . Hypertension Father   . Diabetes Father   . Coronary artery disease Father   . Migraines Paternal Grandmother   . Breast cancer Paternal Grandmother   . Cancer Mother        breast  . Hemochromatosis Mother   . Breast cancer Mother   . Depression Mother   . Anxiety disorder Mother   . Coronary artery disease Paternal Grandfather   . Anxiety disorder Brother   . Bipolar disorder Brother   . Healthy Daughter   . Healthy Son     Social History:  Social History   Socioeconomic History  . Marital status: Divorced    Spouse name: Not on file  . Number of children: 2  . Years of education: some colle  . Highest education level: Not on file  Occupational History    Employer: NOT EMPLOYED  Tobacco Use  . Smoking status: Former Smoker    Packs/day: 0.25    Years: 6.00    Pack years: 1.50    Types: E-cigarettes    Start date: 05/2018  . Smokeless tobacco: Never Used  Vaping Use  . Vaping Use: Former  Substance and Sexual Activity  . Alcohol use: Yes    Alcohol/week: 0.0 standard drinks    Comment: 1-2 beers a couple times a month   . Drug use: Not Currently    Types: Marijuana    Comment: Previously opioid addiction went through rehab  . Sexual activity: Yes    Partners: Male    Birth control/protection: Surgical, Condom    Comment: tubal and ablation  Other Topics Concern  . Not on file  Social History Narrative   Patient is right handed.   Patient drinks one cup of coffee daily.   She lives in a one-story home with her two children (7 year old son, 39 month old daughter).   Highest level of education: some college   Previously worked as EMT in  the [employer]  Financial Resource Strain:   . Difficulty of Paying Living Expenses:   Food Insecurity:   . Worried About Charity fundraiser in the Last Year:   . Arboriculturist in the Last Year:   Transportation Needs:   . Film/video editor (Medical):   Marland Kitchen Lack of Transportation (Non-Medical):   Physical Activity:   . Days of Exercise per Week:   . Minutes of Exercise per Session:   Stress:   . Feeling of Stress :   Social Connections:   . Frequency of Communication with Friends and Family:   . Frequency of Social Gatherings with Friends and Family:   . Attends Religious Services:   . Active Member of Clubs or Organizations:   . Attends Archivist Meetings:   Marland Kitchen Marital Status:     Allergies:  Allergies  Allergen Reactions  . Gabapentin Other (See Comments)    Pt states that she was unresponsive after taking this medication, but her vitals remained stable.    . Metoclopramide Hcl Anxiety and Other (See Comments)    Pt states that she felt like she was trapped in a box, and could not get out.  Pt also states that she had temporary loss of movement, weakness, and tingling.    . Other Itching, Rash and Hives    Please use paper tape Other reaction(s): Other (See Comments) Please use paper tape  . Tramadol Other (See Comments)    Seizures  . Ativan [Lorazepam] Other (See Comments)    combative  . Latex Itching, Rash and Other (See Comments)    Burns  . Lyrica [Pregabalin] Other (See Comments)    suicidal  . Trazodone And Nefazodone Other (See Comments)    Makes pt "like a zombie"  . Zofran [Ondansetron] Other (See Comments)    Migraines in high doses    . Iron Other (See Comments)    PATIENT IS INTOLERANT TO ORAL IRON , SHE DOES NOT ABSORB IT.    Marland Kitchen Sulfa Antibiotics Hives  . Butrans [Buprenorphine] Rash    Patch caused rash, didn't help with pain  . Nucynta [Tapentadol] Nausea And Vomiting  . Propofol Nausea And  Vomiting  . Tape Itching, Rash and Other (See Comments)    Please use paper tape  . Toradol [Ketorolac Tromethamine] Anxiety    Metabolic Disorder Labs: Lab Results  Component Value Date   HGBA1C 5.7 (H) 01/15/2015   Lab Results  Component Value Date   PROLACTIN 80.0 (H) 05/09/2015   Lab Results  Component Value Date   CHOL 95 03/10/2012   TRIG 50 03/10/2012   HDL 38 (L) 03/10/2012   CHOLHDL 2.5 03/10/2012   VLDL 10 03/10/2012   LDLCALC 47 03/10/2012   Lab Results  Component Value Date   TSH 2.616 08/25/2019   TSH 1.592 05/25/2019    Therapeutic Level Labs: No results found for: LITHIUM Lab Results  Component Value Date   VALPROATE 66 06/23/2020   No components found for:  CBMZ  Current Medications: Current Outpatient Medications  Medication Sig Dispense Refill  . albuterol (PROVENTIL) (2.5 MG/3ML) 0.083% nebulizer solution Take 3 mLs (2.5 mg total) by nebulization every 4 (four) hours as needed for wheezing. 150 mL 12  . baclofen 5 MG TABS Take 5 mg by mouth 3 (three) times daily as needed for muscle spasms. 20 each 0  . BELBUCA 75 MCG FILM Take 1 strip by mouth 2 (two) times daily. (Patient not taking: Reported  on 07/03/2020)    . budesonide-formoterol (SYMBICORT) 160-4.5 MCG/ACT inhaler Inhale 2 puffs into the lungs 2 (two) times daily. 1 Inhaler 5  . busPIRone (BUSPAR) 5 MG tablet Take 1 tablet (5 mg total) by mouth 3 (three) times daily. 90 tablet 2  . clonazePAM (KLONOPIN) 0.5 MG tablet Take 1 tablet (0.5 mg total) by mouth 2 (two) times daily. 60 tablet 2  . divalproex (DEPAKOTE) 500 MG DR tablet Take 1 tablet (500 mg total) by mouth every 12 (twelve) hours. 60 tablet 0  . DULoxetine (CYMBALTA) 60 MG capsule Take 1 capsule (60 mg total) by mouth 2 (two) times daily. 180 capsule 2  . HYDROmorphone (DILAUDID) 2 MG tablet Take 2 tablets (4 mg total) by mouth every 12 (twelve) hours as needed for severe pain. Take 1-2 tablets po q 4 hrs prn pain no greater than 9  tablets/day 1 tablet 0  . pantoprazole (PROTONIX) 40 MG tablet TAKE 1 TABLET BY MOUTH TWICE DAILY BEFORE MEALS. (Patient taking differently: Take 40 mg by mouth 2 (two) times daily. ) 60 tablet 3  . promethazine (PHENERGAN) 25 MG tablet TAKE (1) TABLET BY MOUTH TWICE A DAY AS NEEDED. (Patient taking differently: Take 25 mg by mouth 2 (two) times daily as needed. TAKE (1) TABLET BY MOUTH TWICE A DAY AS NEEDED.) 14 tablet 0  . topiramate (TOPAMAX) 100 MG tablet Take 1 tablet (100 mg total) by mouth 2 (two) times daily. 60 tablet 2  . traZODone (DESYREL) 150 MG tablet Take 1 tablet (150 mg total) by mouth at bedtime. 30 tablet 2   No current facility-administered medications for this visit.     Musculoskeletal: Strength & Muscle Tone: within normal limits Gait & Station: normal Patient leans: N/A  Psychiatric Specialty Exam: Review of Systems  Constitutional: Positive for fatigue.  Musculoskeletal: Positive for back pain.  All other systems reviewed and are negative.   There were no vitals taken for this visit.There is no height or weight on file to calculate BMI.  General Appearance: Casual and Fairly Groomed  Eye Contact:  Good  Speech:  Clear and Coherent  Volume:  Normal  Mood:  Euthymic  Affect:  Appropriate and Congruent  Thought Process:  Goal Directed  Orientation:  Full (Time, Place, and Person)  Thought Content: Rumination   Suicidal Thoughts:  No  Homicidal Thoughts:  No  Memory:  Immediate;   Good Recent;   Good Remote;   Fair  Judgement:  Poor  Insight:  Shallow  Psychomotor Activity:  Decreased  Concentration:  Concentration: Fair and Attention Span: Fair  Recall:  Good  Fund of Knowledge: Good  Language: Good  Akathisia:  No  Handed:  Right  AIMS (if indicated): not done  Assets:  Communication Skills Desire for Improvement Resilience Social Support Talents/Skills  ADL's:  Intact  Cognition: WNL  Sleep:  Fair   Screenings: AIMS     Admission  (Discharged) from 06/19/2020 in [facility]  AIMS Total Score 0    AUDIT     Admission (Discharged) from 06/19/2020 in [facility] ED to Hosp-Admission (Discharged) from 05/09/2015 in [facility]  Alcohol Use Disorder Identification Test Final Score (AUDIT) 1 0    GAD-7     Counselor from 12/11/2018 in [facility]or from 02/07/2017 in [facility]  Total GAD-7 Score 19 17    PHQ2-9  ED from 06/17/2020 in [facility] Counselor from 12/11/2018 in [facility] Counselor from 07/22/2017 in [facility] Counselor from 02/07/2017 in [facility] Office Visit from 01/10/2017 in [facility]  PHQ-2 Total Score '4 6 3 2 3  '$ PHQ-9 Total Score '22 22 15 14 10       '$ Assessment and Plan: This patient is a 34 year old female with a history of depression anxiety ADHD and borderline traits she seems to be doing somewhat better since her recent hospitalization.  She will continue clonazepam 0.5 mg twice daily for anxiety as well as BuSpar 5 mg 3 times daily for anxiety.  She will continue trazodone 150 mg nightly for sleep and Cymbalta 60 mg twice daily for depression.  I would rather that her primary doctor managed the Topamax.  She will return to see me in 4 weeks   Penny Spiller, MD 07/09/2020, 2:47 PM

## 2020-07-10 ENCOUNTER — Other Ambulatory Visit: Payer: Self-pay

## 2020-07-10 ENCOUNTER — Ambulatory Visit (HOSPITAL_COMMUNITY): Payer: Medicaid Other | Admitting: Clinical

## 2020-07-11 ENCOUNTER — Ambulatory Visit: Payer: Self-pay

## 2020-07-12 NOTE — Patient Outreach (Signed)
Care Coordination  07/12/2020  Penny [name] October 25, 1986 [id number]   Telephone visit on 07/10/2020 Reason for referral: medication assistance.  Back Pain with back brace. L1 fracture.   Late entry for documentation:  Placed call to patient who answered and identified herself.  Reviewed with patient the reason for call. Patient reports to me that she has all her medications and is taking her medications as prescribed.  Reports she does not drive but has family members to assist her to getting to appointments and other outings as she needs.  Patient reports she needs help with house activities.   Reports she has a Nurse, learning disability who is assisting her with her needs.  Reports she wear depends for incontinence.  Takes Dilaudid 2 mg every 4-6 hours for pain.   Recent discharge from behavior health with depression and anxiety.   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   Active with neurosurgeon Dr. Earlean Polka.    PLAN: denies any nursing needs or pharmacy needs at this time. Identified social work needs and will place referral for [facility] social worker. Will mail a successful outreach letter.  Tomasa Rand, RN, BSN, CEN [facility] [employer] [phone]

## 2020-07-14 ENCOUNTER — Encounter: Payer: Self-pay | Admitting: *Deleted

## 2020-07-14 ENCOUNTER — Other Ambulatory Visit: Payer: Self-pay | Admitting: *Deleted

## 2020-07-14 ENCOUNTER — Ambulatory Visit: Payer: Medicaid Other | Admitting: Family Medicine

## 2020-07-14 ENCOUNTER — Other Ambulatory Visit: Payer: Self-pay

## 2020-07-14 NOTE — Patient Outreach (Signed)
[facility]) Care Management  07/14/2020  Penny [name] [birth year] [id number]   CSW was able to make initial contact with patient today to perform phone assessment, as well as assess and assist with social work needs and services.  CSW introduced self, explained role and types of services provided through [facility] ([facility]).  CSW further explained to patient that CSW works with patient's RNCM, also with [facility], Tomasa Rand.  CSW then explained the reason for the call, indicating that Ms. Lacinda Axon thought that patient would benefit from social work services and resources to assist with arranging in-home care services to assist with chores.  CSW obtained two HIPAA compliant identifiers from patient, which included patient's name and date of birth.  Patient admitted that she could definitely use some help around the house with various chores, indicating that she currently lives with her boyfriend, his two children and her own two children.  Patient apologized for not being able to elaborate on all her specific needs, reporting that she needed to terminate the call to attend her counseling session.  CSW voiced understanding, agreeing to mail patient the following list of resources: PCS ([place]) Application; PCS (Personal Care Services) Providers; CAPS Forensic scientist) Programmer, systems; CAPS Forensic scientist) Application; In-Home Care and Respite Agencies; [place]; Software engineer; Emergency Assistance Programs.  CSW also agreed to place referrals for patient through the [facility] platform to see if she is eligible to receive assistance obtaining in-home care services, as well as financial assistance.  Patient was agreeable to this plan, providing CSW with verbal consent.  CSW explained to patient that CSW will follow-up with her again next week, on  Wednesday, July 23, 2020, around 10:00am, to ensure that she received the packet of resource information, in addition to answering any questions that she may have pertaining to the information received.  CSW went on to explain to patient that CSW is happy to assist with application completion and the referral process.  CSW was able to confirm that patient has the correct contact information for CSW, encouraging her to contact CSW directly if additional social work needs arise in the meantime.  Nat Christen, BSW, MSW, LCSW  Licensed Education officer, [facility]  Mailing [place] [address], [place], [place] [address] Physical Address-[address], [place], [place] [address] Toll Free Main # [phone] Fax # [phone] Cell # [phone]  Office # [phone] [email or web address]

## 2020-07-15 ENCOUNTER — Ambulatory Visit (HOSPITAL_COMMUNITY): Payer: Medicaid Other | Admitting: Clinical

## 2020-07-15 ENCOUNTER — Encounter: Payer: Self-pay | Admitting: Family Medicine

## 2020-07-16 NOTE — Telephone Encounter (Signed)
Nurses Please connect with patient I imagine from this communication she needs a prescription sent in? Any particular reason why she does not use the pill versus ODT? (Pill would be cheaper) Please see specifically what the patient needs sent in and we will help assist

## 2020-07-18 NOTE — Telephone Encounter (Signed)
Nurses This patient would like to have this medication in the ODT form 30 mg #30 with 3 refills Please talk with pharmacist at her pharmacy to see if this is covered by her insurance if not we may have to choose a pill or capsule (Nurses if this is a prior approval medication then she will need to go with a pill or a capsule)

## 2020-07-18 NOTE — Telephone Encounter (Signed)
Spoke with pharmacist at [facility]: Her Medicaid does not cover the ODT medication but will cover the capsules. The pharmacist says she ban open the capsules and but in in apple sauce or pudding if she doesn't want to swallow the capsule whole

## 2020-07-19 IMAGING — DX DG CHEST 1V PORT
1 series · 1 of 1 positions shown · non-contrast
Comparison: Chest x-ray dated September 19, 2019.

CLINICAL DATA: Chest pain.

EXAM:
PORTABLE CHEST 1 VIEW

[chest ap]
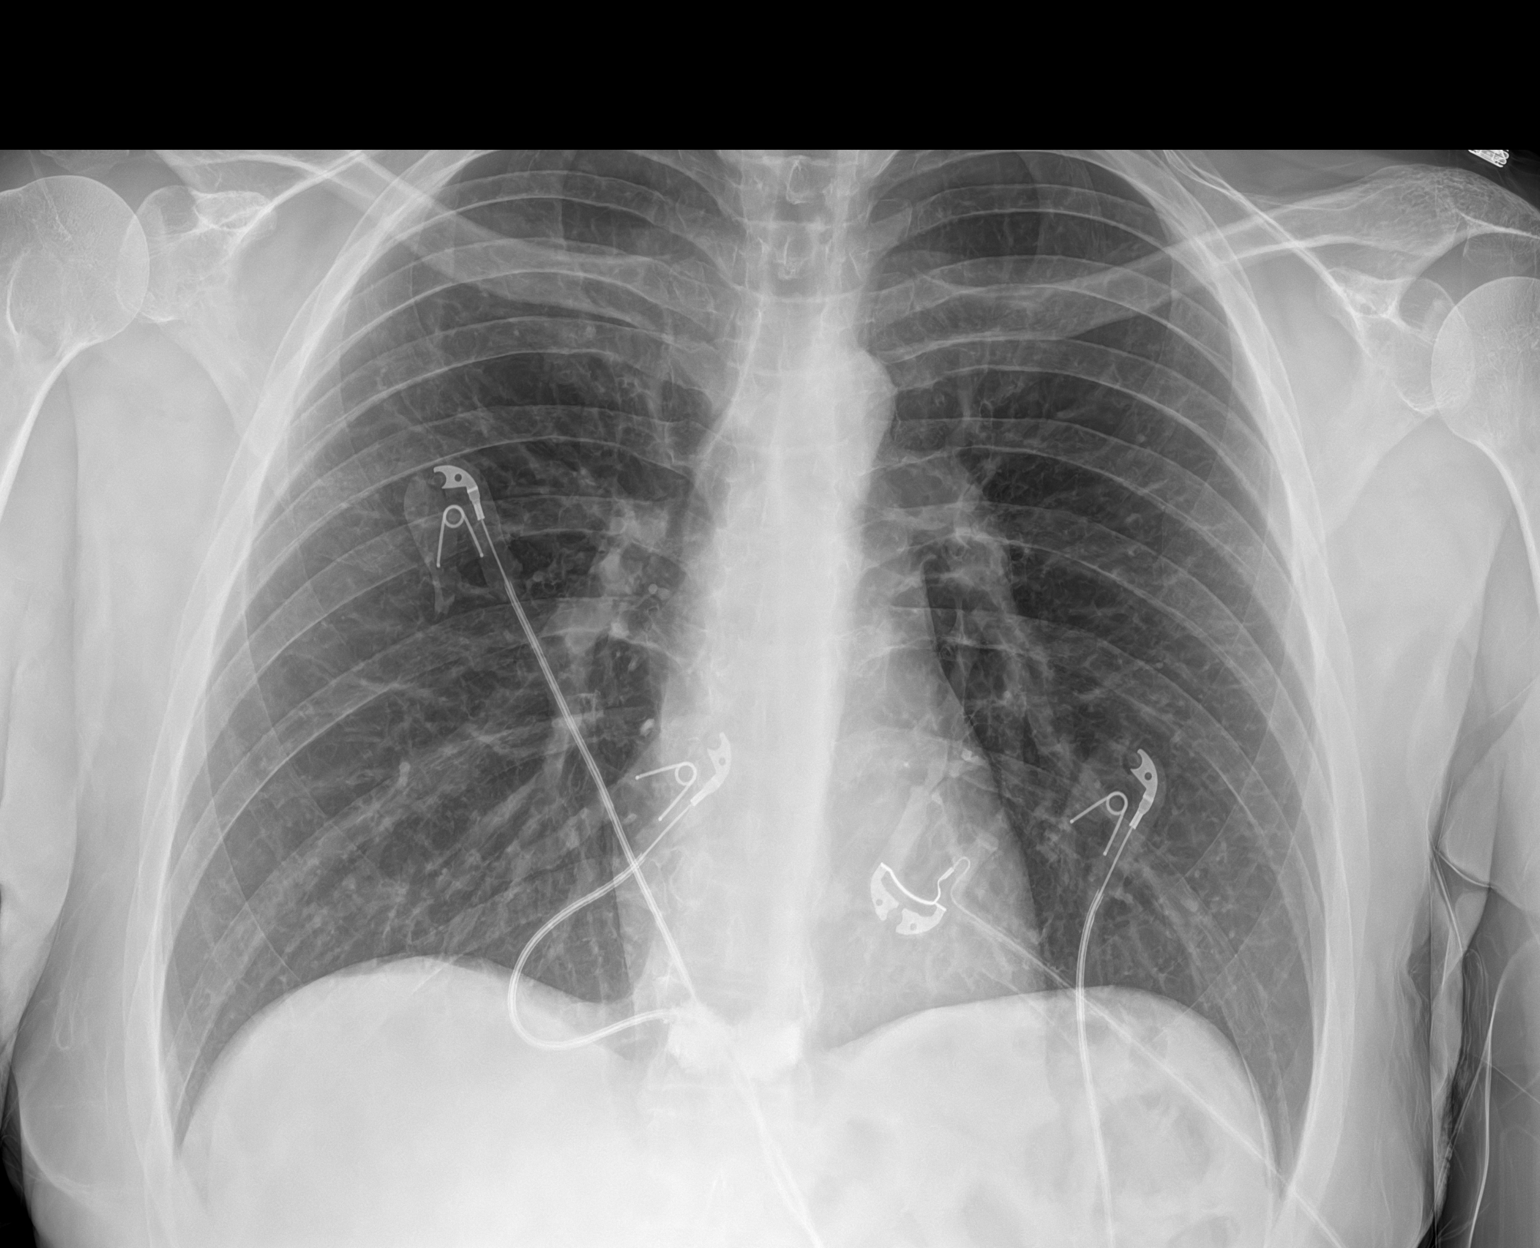

[1 of 1 positions shown; findings below may reference images not displayed]

FINDINGS: The heart size and mediastinal contours are within normal limits.
Normal pulmonary vascularity. No focal consolidation, pleural
effusion, or pneumothorax. No acute osseous abnormality. Prior T12
kyphoplasty.
IMPRESSION: No active disease.

## 2020-07-21 ENCOUNTER — Other Ambulatory Visit: Payer: Self-pay | Admitting: *Deleted

## 2020-07-21 IMAGING — RF DG THORACOLUMBAR SPINE 2V
1 series · 1 of 1 positions shown · non-contrast
Comparison: MRI thoracic spine 05/19/2018. Lumbar spine x-rays
06/02/2018.

CLINICAL DATA: T12 vertebral augmentation performed for a subacute
T12 compression fracture.

EXAM:
OPERATIVE THORACOLUMBAR SPINE 2 VIEW(S)

[Series 1: run · 1 of 1 slices shown]
[im 1/1]
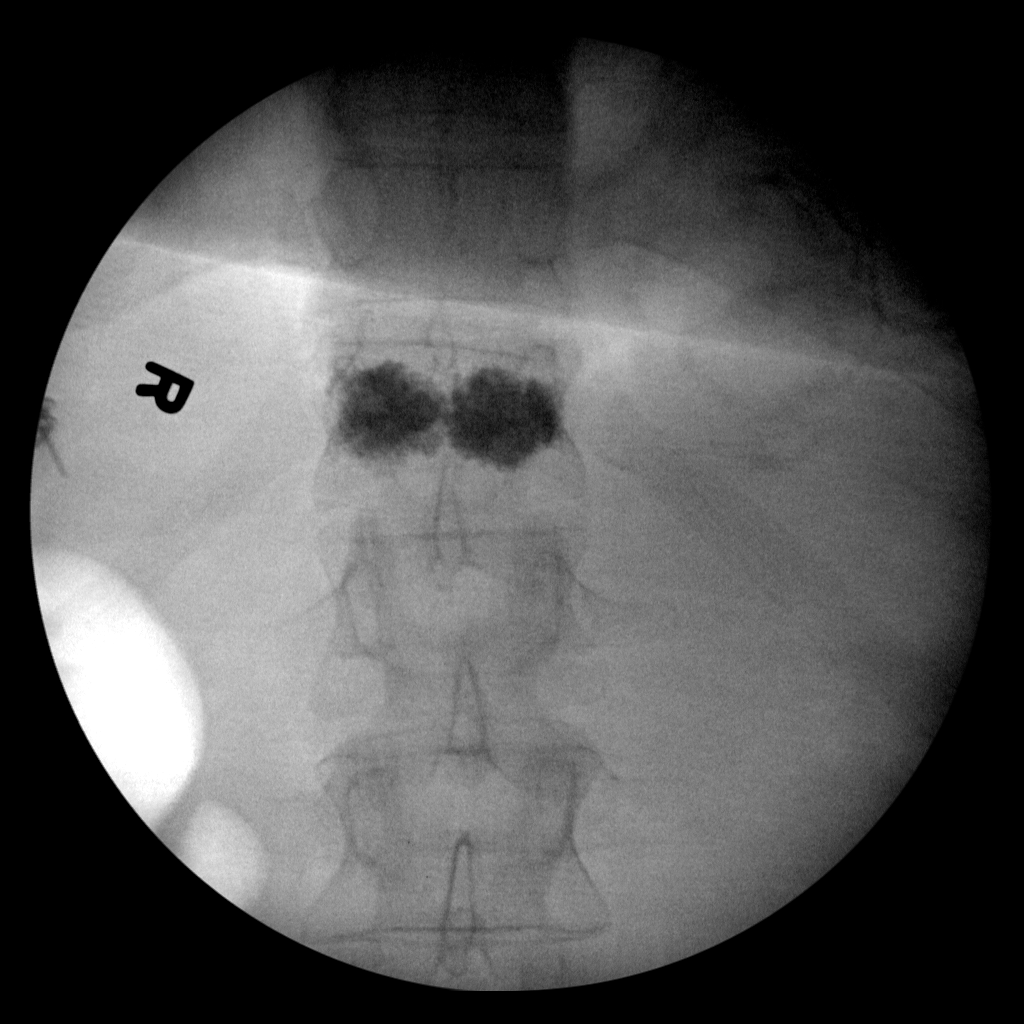

[1 of 1 positions shown; findings below may reference images not displayed]

FINDINGS: Three spot images from the C-arm fluoroscopic device are submitted
for interpretation postoperatively, AP and LATERAL views of the
thoracolumbar spine. T12 vertebral augmentation. No evidence of
extravasation of the injected methylmethacrylate. Anatomic POSTERIOR
alignment.
IMPRESSION: No acute complicating features post T12 vertebral augmentation.

## 2020-07-21 MED ORDER — LANSOPRAZOLE 30 MG PO CPDR
30.0000 mg | DELAYED_RELEASE_CAPSULE | Freq: Every day | ORAL | 5 refills | Status: DC
Start: 2020-07-21 — End: 2020-08-26

## 2020-07-21 NOTE — Telephone Encounter (Signed)
Nurses Go with the 30 mg capsules of the Prevacid #30 with 5 refills Send patient message that the capsules may be opened up into applesauce. Also send her a message letting her know that ODT medication is not covered. Absorption via the capsules above are equivalent to the absorption of the ODT.

## 2020-07-23 ENCOUNTER — Other Ambulatory Visit: Payer: Self-pay | Admitting: *Deleted

## 2020-07-23 NOTE — Patient Outreach (Signed)
[facility]) Care Management  07/23/2020  Miesha Bachmann Nov 21, 1986 [id number]   CSW made an attempt to try and contact patient today to follow-up regarding social work services and resources, as well as to ensure that patient received the packet of resource information mailed to her home by CSW last week; however, patient was unavailable at the time of CSW's call.  CSW left a HIPAA compliant message on voicemail for patient and is currently awaiting a return call.  CSW will make a second outreach attempt within the next 3-4 business days, on Tuesday, July 29, 2020, around 10:00am, if a return call is not received from patient in the meantime.  Nat Christen, BSW, MSW, LCSW  Licensed Education officer, environmental [facility]  Mailing [place] [address] [place], [place] [address] Physical Address-[address], [place], [place] [address] Toll Free Main # [phone] Fax # [phone] Cell # [phone]  Office # [phone] [email or web address]

## 2020-07-28 ENCOUNTER — Other Ambulatory Visit: Payer: Self-pay

## 2020-07-28 ENCOUNTER — Ambulatory Visit (INDEPENDENT_AMBULATORY_CARE_PROVIDER_SITE_OTHER): Payer: Medicaid Other | Admitting: Clinical

## 2020-07-28 DIAGNOSIS — M5441 Lumbago with sciatica, right side: Secondary | ICD-10-CM | POA: Diagnosis not present

## 2020-07-28 DIAGNOSIS — J45909 Unspecified asthma, uncomplicated: Secondary | ICD-10-CM | POA: Diagnosis not present

## 2020-07-28 DIAGNOSIS — M5442 Lumbago with sciatica, left side: Secondary | ICD-10-CM | POA: Diagnosis not present

## 2020-07-28 DIAGNOSIS — G8929 Other chronic pain: Secondary | ICD-10-CM | POA: Diagnosis not present

## 2020-07-28 DIAGNOSIS — F332 Major depressive disorder, recurrent severe without psychotic features: Secondary | ICD-10-CM | POA: Diagnosis not present

## 2020-07-28 DIAGNOSIS — F411 Generalized anxiety disorder: Secondary | ICD-10-CM

## 2020-07-28 DIAGNOSIS — Z79899 Other long term (current) drug therapy: Secondary | ICD-10-CM | POA: Diagnosis not present

## 2020-07-28 NOTE — Progress Notes (Signed)
Virtual Visit via Telephone Note  I connected with Penny [name] on 07/28/20 at  1:00 PM EDT by telephone and verified that I am speaking with the correct person using two identifiers.  Location: Patient:Home Provider:Office  I discussed the limitations, risks, security and privacy concerns of performing an evaluation and management service by telephone and the availability of in person appointments. I also discussed with the patient that there may be a patient responsible charge related to this service. The patient expressed understanding and agreed to proceed.       THERAPIST PROGRESS NOTE  Session Time:1:00PM-1:55PM  Participation Level:Active  Behavioral Response:CasualAlertDepressedand NA  Type of Therapy:Individual Therapy  Treatment Goals addressed:Coping  Interventions:CBT, Motivational Interviewing, Strength-based and Supportive  Summary:Penny Hennisis 33 presents with GAD and MDD.The OPT therapist worked with thepatientfor herinitial OPT treatment session. The OPT therapist utilized Motivational Interviewing to assist in creating therapeutic repore. The patient in the session was engaged and work in Science writer about hertriggers and symptoms over the past few weeksincludingdifficulty with co-parenting and preparing for her children to return to school.The OPT therapist worked with the patient implementing elements of CBT.The OPT therapist provided psycho-education during the session.The OPT therapist worked with the patient on implementing co-parenting strategies, positive thinking, and work on communication with her partner.  Suicidal/Homicidal:Nowithout intent/plan  Therapist Response:The OPT therapist worked with the patient for the patientsscheduled session. The patient was engaged in hersession and gave feedback in relation to triggers, symptoms, and behavior responses over the pastfewweeks.  The patienthas been having difficultymanaging symptoms of her GAD and MDD .The OPT therapist in sessionworkedwith the patient and utilized elements of CBT in working with the patient to help empower the patient who has been triggered by co-parenting stress. The OPT therapist worked with the patient on co-parenting strategies, positive thinking, and communication with her partner  Plan: Return again in2/3weeks.  Diagnosis:Axis I:ADHD,inattentivetype and GAD  Axis II:No diagnosis  I discussed the assessment and treatment plan with the patient. The patient was provided an opportunity to ask questions and all were answered. The patient agreed with the plan and demonstrated an understanding of the instructions.  The patient was advised to call back or seek an in-person evaluation if the symptoms worsen or if the condition fails to improve as anticipated.  I provided9minutes of non-face-to-face time during this encounter.  Lennox Grumbles, LCSW 07/28/2020

## 2020-07-29 ENCOUNTER — Encounter: Payer: Self-pay | Admitting: *Deleted

## 2020-07-29 ENCOUNTER — Other Ambulatory Visit: Payer: Self-pay | Admitting: *Deleted

## 2020-07-29 NOTE — Patient Outreach (Signed)
[facility]) Care Management  07/29/2020  Penny [name] [birth year] [id number]  CSW was able to make contact with patient today to follow-up regarding social work services and resources, as well as to ensure that patient received the packet of resource information mailed to her home by CSW on Monday, July 14, 2020.  Patient confirmed receipt, but admitted that she has not yet begun completing the applications for in-home care services.  Patient now has the following list of applications and resources in her possession:  PCS Orthoptist) Application; PCS (Personal Care Services) Providers; CAPS Energy manager) Science writer; CAPS Energy manager) Application; In-Home Care and Respite Agencies; Home Health Care Agencies; Hotel manager and Emergency Assistance Programs.  Patient indicated that she does not require assistance with activities of daily living, such as bathing, feeding, dressing, meal preparation, medication administration, etc., she is able to perform these activities independently.  However, patient would like for someone to come into her home twice a month to perform light housekeeping duties.  CSW explained to patient that she is able to apply for PCS, through the [facility] and [employer], and CAPS, through the [facility], because she is a [employer] recipient.  But, CSW further explained to patient that she may not actually qualify for services, simply based on need.  Patient is aware that she can hire someone privately, but that she would be responsible for the out-of-pocket expense.   Patient reported that she and her boyfriend live together, and that they have four children between the two of them to care for, not having any additional money each month to pay for in-home care services out-of-pocket.  CSW voiced  understanding, explaining to patient that these are the only resources available at this time.  CSW encouraged patient to enlist the help of the children to assist with chores and light housekeeping duties.  Patient indicated that she will definitely be giving it some consideration, as all the children are old enough to begin cleaning up after themselves.  CSW offered to assist patient with completion of the applications for PCS and CAPS; however, patient declined, indicating that she may not even apply for services, realizing that she will probably not even qualify.    Patient verbalized that she is able to afford to pay for her prescription medications, and takes them exactly as prescribed, especially the psychotropic medications.  Patient went on to explain that she has transportation to and from all of her physician appointments, and that she is able to pay her rent and utility bills each month.  Patient stated that she is already established with Suzan Garibaldi, Licensed Clinical Social Worker, who provides ongoing counseling and psychotherapeutic services, as well as Dr. Diannia Ruder, Child and Adolescent Psychiatrist, who manages patient's psychotropic medications, both with [facility] in [place], [place].  Patient admitted that she is very pleased with the support and services she receives from her mental health team.   CSW will perform a case closure on patient, as all goals of treatment have been met from social work standpoint and no additional social work needs have been identified at this time.  CSW will notify patient's RNCM with [facility] Care [facility], Rowe Pavy of CSW's plans to close patient's case.  CSW will fax an update to patient's Primary Care Physician, Dr. [name] to ensure that he is aware of CSW's  involvement with patient's plan of care, as well as route a Case Closure Physician Letter.  CSW was able to confirm that patient has the  correct contact information for CSW, encouraging patient to contact CSW directly if patient changes her mind about wanting to receive assistance with application completion, or if additional social work needs arise in the near future.  Patient was agreeable to this plan.    Danford Bad, BSW, MSW, LCSW  Licensed Restaurant manager, fast food [facility]  Mailing [place] [address] [place], [place] [address] Physical Address-[address], [place], [place] [address] Toll Free Main # [phone] Fax # [phone] Cell # [phone]  [name]

## 2020-08-01 ENCOUNTER — Telehealth (HOSPITAL_COMMUNITY): Payer: Self-pay | Admitting: Psychiatry

## 2020-08-01 NOTE — Telephone Encounter (Signed)
ok 

## 2020-08-01 NOTE — Telephone Encounter (Signed)
Spine & Scoliosis Specialist called advising of patient medications . The office note was faxed for you to review medication list.

## 2020-08-08 ENCOUNTER — Other Ambulatory Visit: Payer: Self-pay | Admitting: Adult Health

## 2020-08-08 ENCOUNTER — Telehealth: Payer: Self-pay | Admitting: Obstetrics & Gynecology

## 2020-08-08 ENCOUNTER — Telehealth: Payer: Self-pay | Admitting: *Deleted

## 2020-08-08 NOTE — Telephone Encounter (Signed)
Patient called stating that she has contacted her pharmacy and they have sent over a refill request for her Valtrex. Pt states that she is having the worst Flare up and she needs the refill right away. Please contact pt when Valtrex is sent.

## 2020-08-08 NOTE — Telephone Encounter (Signed)
Patient informed Valtrex has been sent to pharmacy.

## 2020-08-15 ENCOUNTER — Other Ambulatory Visit (INDEPENDENT_AMBULATORY_CARE_PROVIDER_SITE_OTHER): Payer: Self-pay | Admitting: *Deleted

## 2020-08-15 DIAGNOSIS — D508 Other iron deficiency anemias: Secondary | ICD-10-CM

## 2020-08-16 IMAGING — MR MR THORACIC SPINE W/O CM
5 of 6 series · 24 of 48 positions shown · non-contrast
Comparison: None.

CLINICAL DATA: L1 fracture, lower back numbness extending into
extremities

EXAM:
MRI THORACIC AND LUMBAR SPINE WITHOUT CONTRAST
TECHNIQUE: Multiplanar and multiecho pulse sequences of the thoracic and lumbar
spine were obtained without intravenous contrast.

[Series 12: T1 · sagittal · 5.0mm · 1.64mm/px · 3 of 9 slices shown (1 of 3)]
[im 1/9]
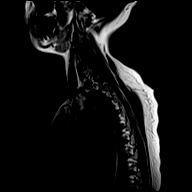
[im 5/9]
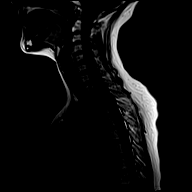
[im 9/9]
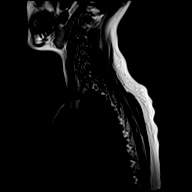

[Series 14: T1 · sagittal · 6.0mm · 1.23mm/px · 3 of 9 slices shown (2 of 3)]
[im 1/9]
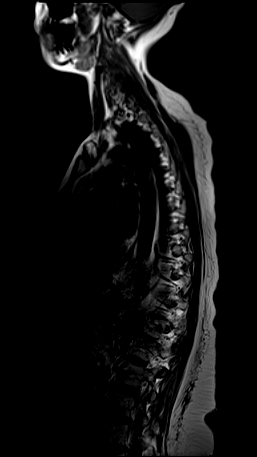
[im 5/9]
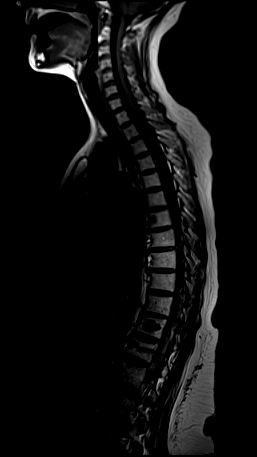
[im 9/9]
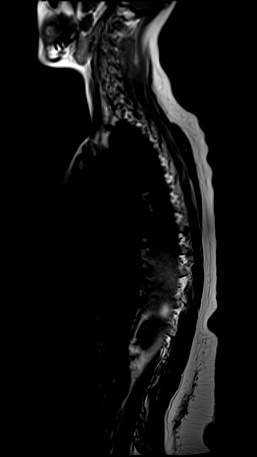

[Series 15: T1 · sagittal · 3.0mm · 0.71mm/px · 6 of 17 slices shown (3 of 3)]
[im 1/17]
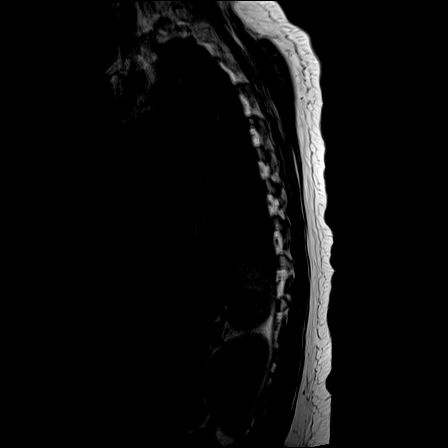
[im 4/17]
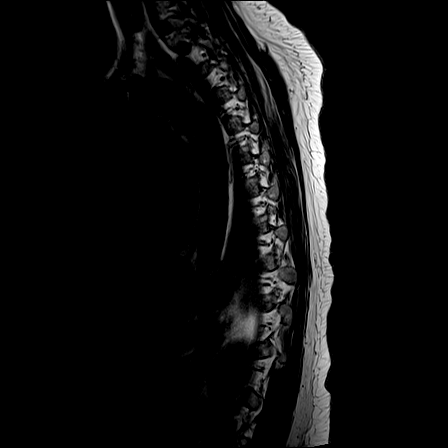
[im 7/17]
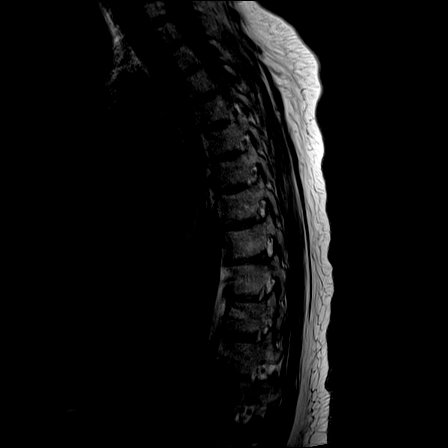
[im 10/17]
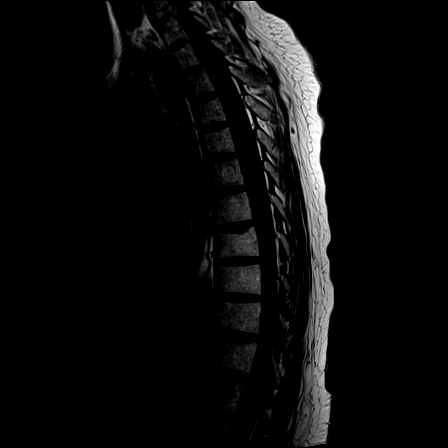
[im 13/17]
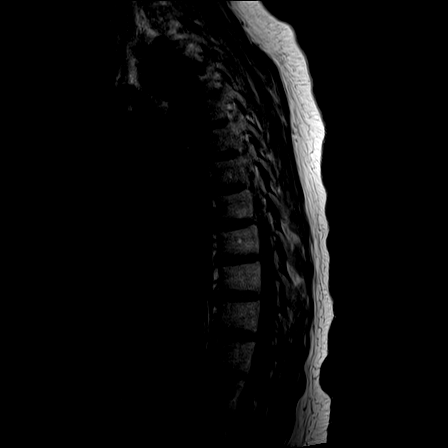
[im 17/17]
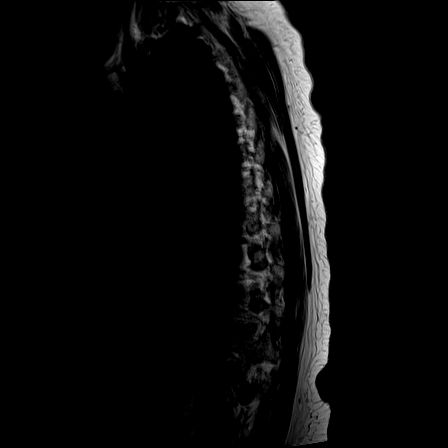

[Series 16: STIR · sagittal · 3.0mm · 0.36mm/px · 3 of 17 slices shown]
[im 1/17]
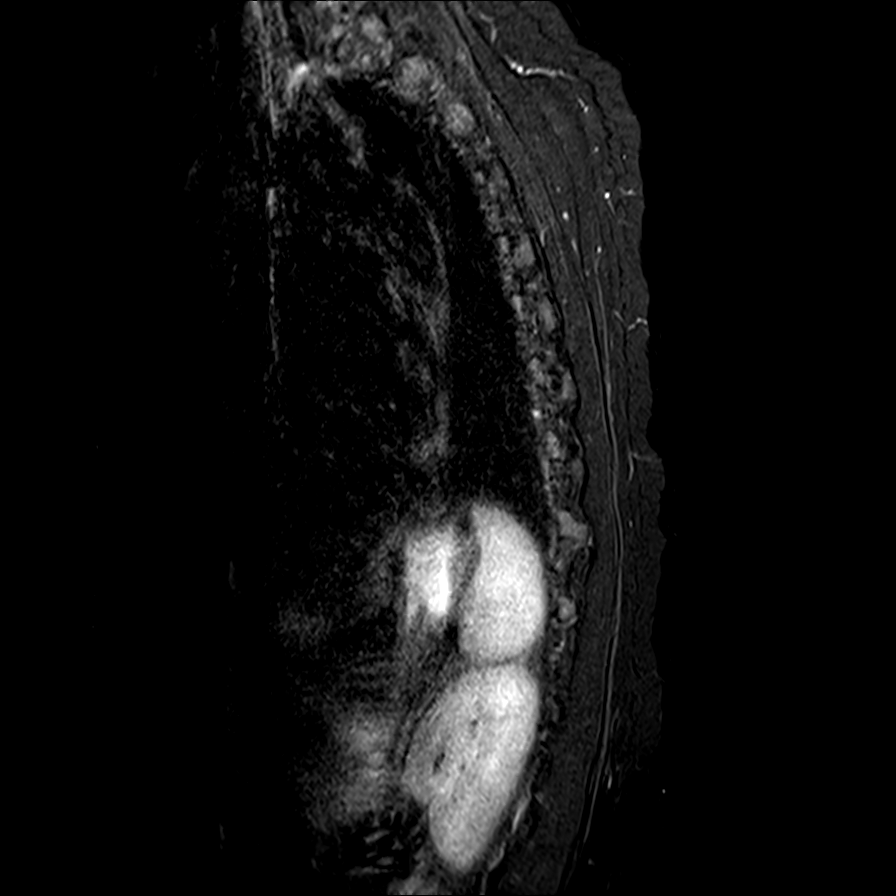
[im 4/17]
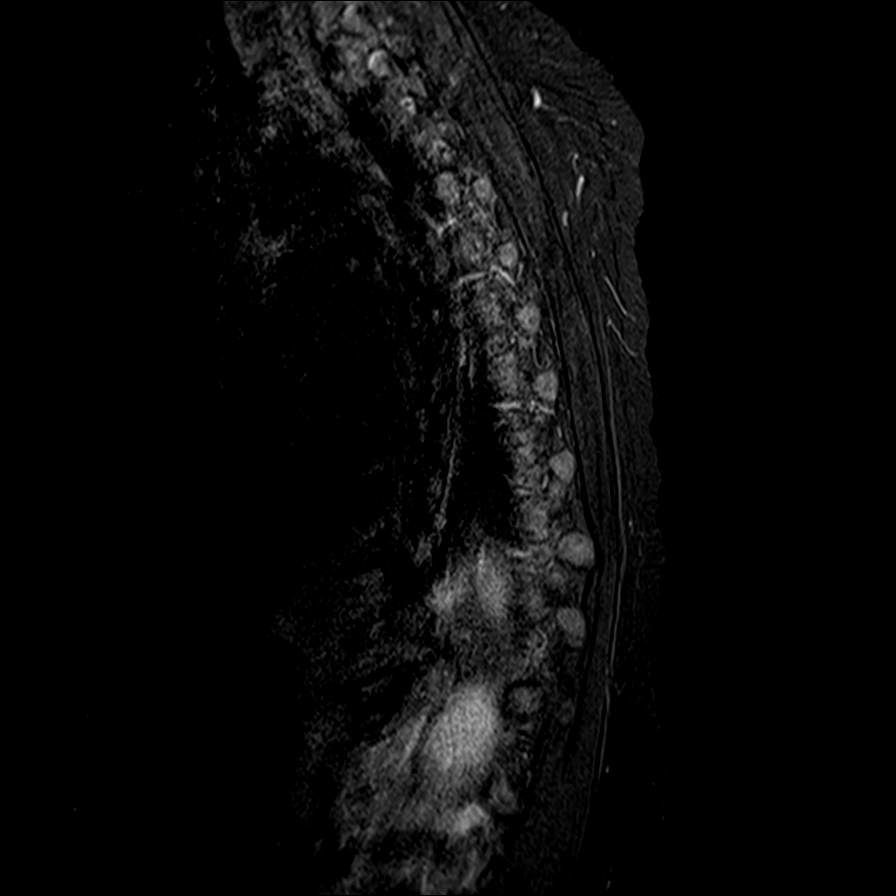
[im 7/17]
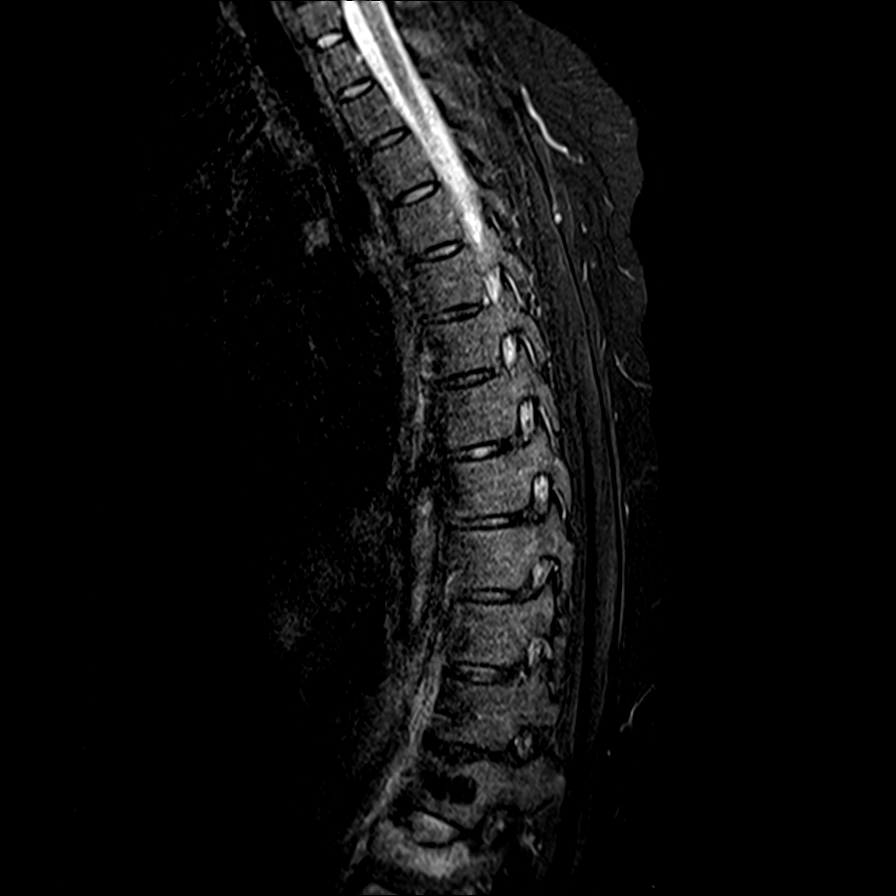

[Series 17: T2 · axial · 4.0mm · 0.59mm/px · z∈[-306,-73]mm · 9 of 42 slices shown]
[im 1/42]
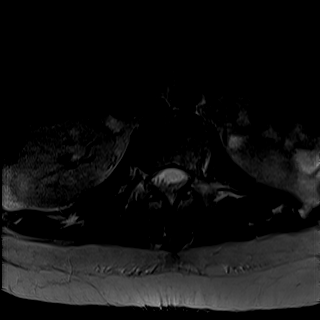
[im 6/42]
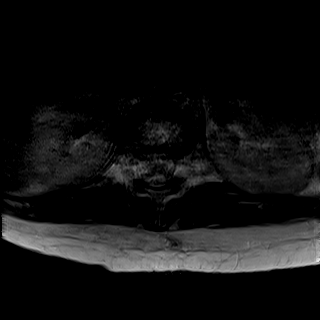
[im 12/42]
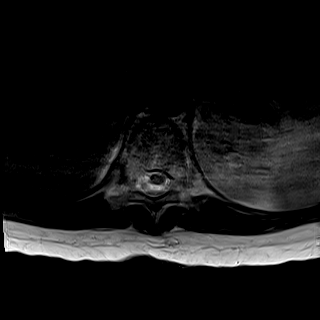
[im 18/42]
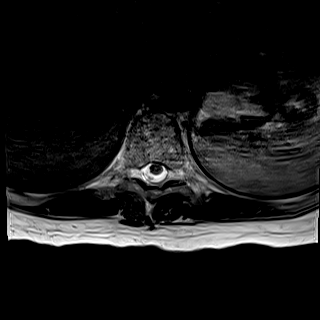
[im 21/42]
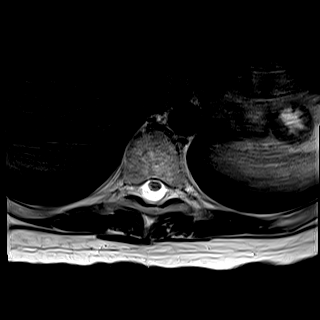
[im 24/42]
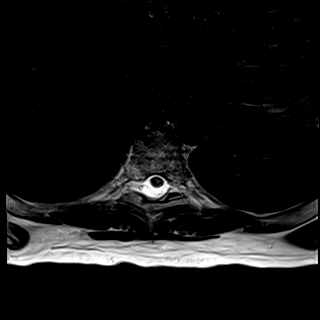
[im 30/42]
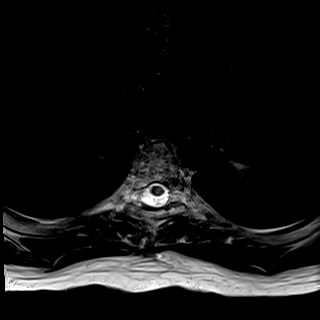
[im 36/42]
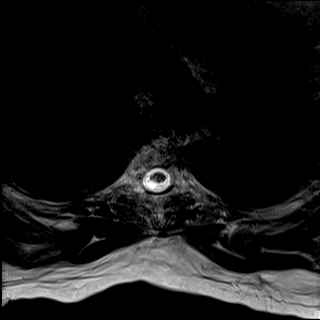
[im 42/42]
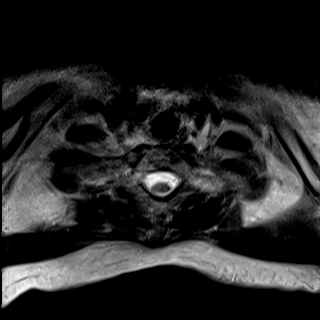

[24 of 48 positions shown; findings below may reference images not displayed]

FINDINGS: MRI THORACIC SPINE

Alignment:  Preserved kyphosis.

Vertebrae: Chronic mild compression deformity of T12 with cement
augmentation. Chronic minimal loss of height at the superior
endplate of T8. Thoracic vertebral body heights are otherwise
maintained. There is no marrow edema. Probable small hemangioma of
the T6 vertebral body.

Cord:  No abnormal signal.

Paraspinal and other soft tissues: Unremarkable.

Disc levels:

Intervertebral disc heights and signal are maintained. There is no
degenerative stenosis.

MRI LUMBAR SPINE

Significant motion artifact is present. Below findings are within
this limitation.

Segmentation: Standard.

Alignment:  Preserved lordosis and anteroposterior alignment.

Vertebrae: There is a fracture line through the mid L1 vertebral
body likely involving anterior and posterior margins and likely the
superior endplate where there is mild, less than 50% loss of height.
Marrow edema does not definitely extend into the posterior elements.
There is minor osseous retropulsion. Lumbar vertebral body heights
and signal are otherwise maintained.

Conus medullaris and cauda equina: Conus extends to the T12-L1
level. Conus and cauda equina appear normal.

Paraspinal and other soft tissues: Unremarkable.

Disc levels: Disc desiccation at L3-L4 and L4-L5. Possible mild disc
bulges at these levels. No significant degenerative stenosis
identified.
IMPRESSION: Lumbar spine imaging is significantly motion degraded.

Acute L1 fracture likely involving anterior and posterior margins as
well as the superior endplate where there is less than 50% loss of
height. Marrow edema does not definitely extend to the posterior
elements but involvement by fracture is difficult to exclude on MRI.
Minor osseous retropulsion.

Chronic thoracic findings detailed above.

## 2020-08-16 IMAGING — MR MR LUMBAR SPINE W/O CM
6 of 9 series · 34 of 48 positions shown · non-contrast
Comparison: None.

CLINICAL DATA: L1 fracture, lower back numbness extending into
extremities

EXAM:
MRI THORACIC AND LUMBAR SPINE WITHOUT CONTRAST
TECHNIQUE: Multiplanar and multiecho pulse sequences of the thoracic and lumbar
spine were obtained without intravenous contrast.

[Series 15: T2 · sagittal · 3.0mm · 0.71mm/px · 6 of 17 slices shown (1 of 4)]
[im 1/17]
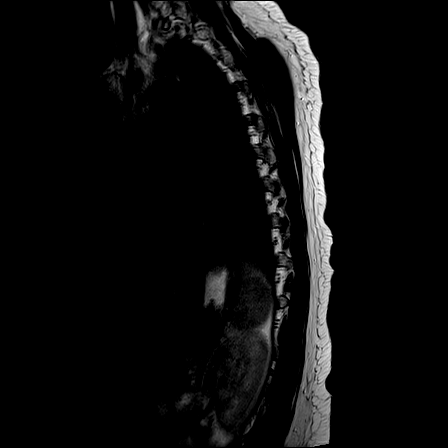
[im 4/17]
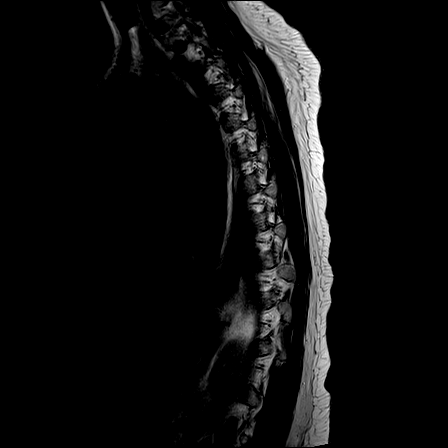
[im 7/17]
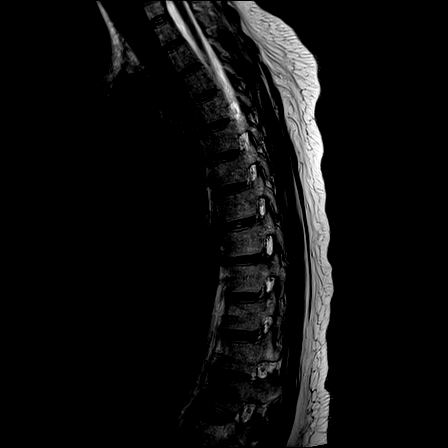
[im 10/17]
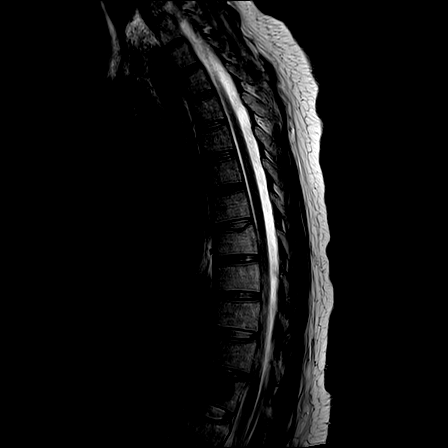
[im 13/17]
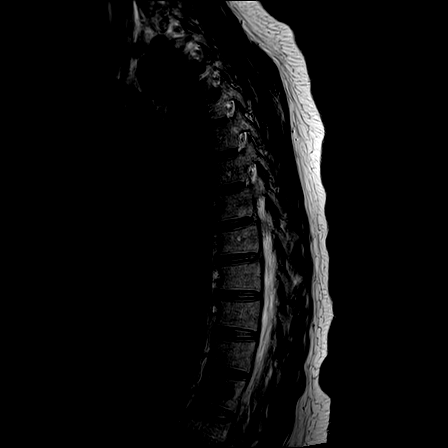
[im 17/17]
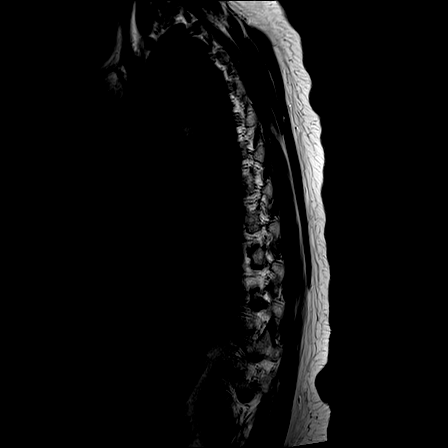

[Series 20: T2 · sagittal · 4.0mm · 0.68mm/px · 6 of 16 slices shown (2 of 4)]
[im 1/16]
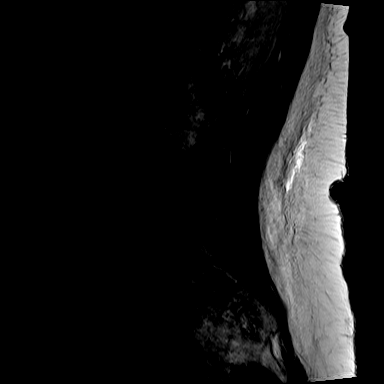
[im 4/16]
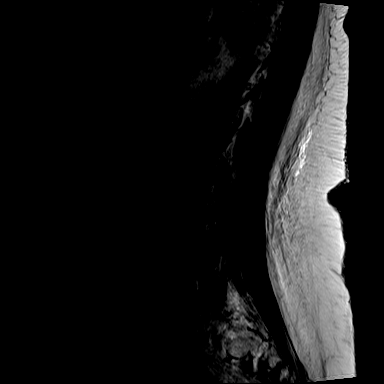
[im 7/16]
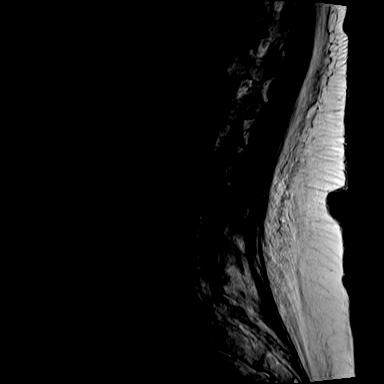
[im 10/16]
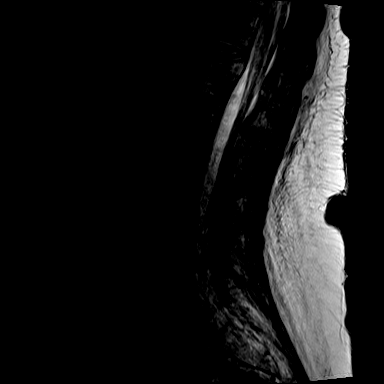
[im 13/16]
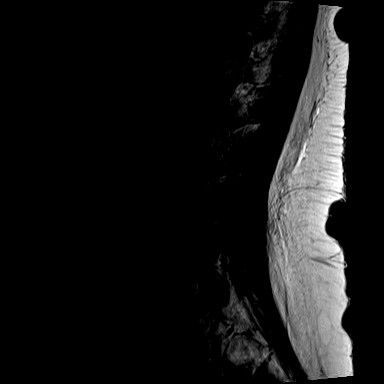
[im 16/16]
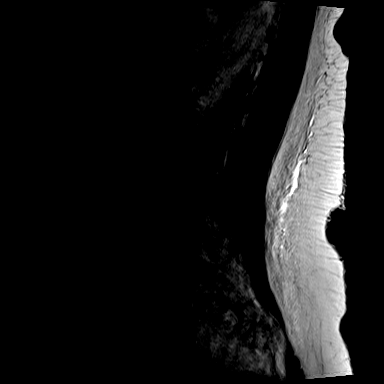

[Series 22: T1 · sagittal · 4.0mm · 0.81mm/px · 6 of 16 slices shown (1 of 2)]
[im 1/16]
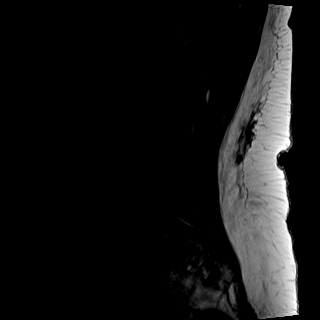
[im 4/16]
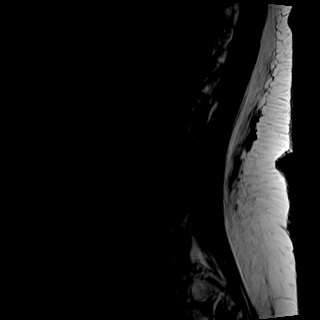
[im 7/16]
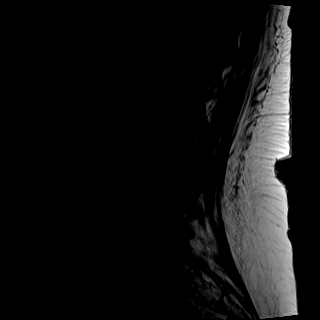
[im 10/16]
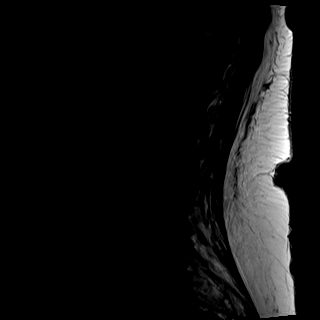
[im 13/16]
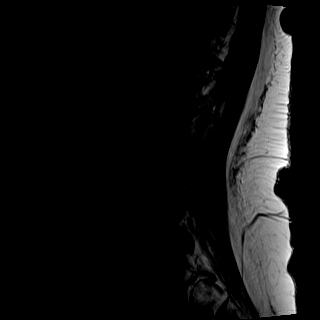
[im 16/16]
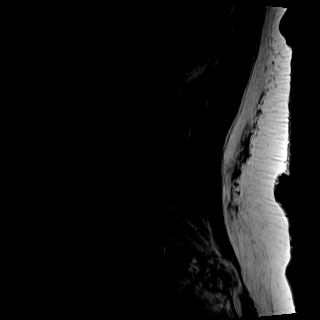

[Series 23: T2 · axial · 4.0mm · 0.70mm/px · z∈[-494,-430]mm · 5 of 14 slices shown (3 of 4)]
[im 1/14]
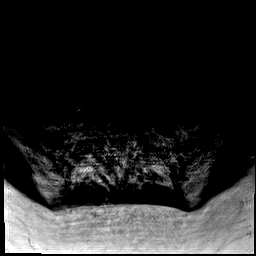
[im 4/14]
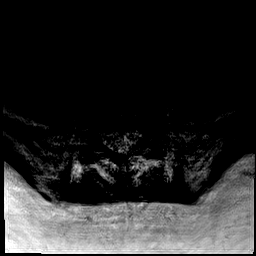
[im 7/14]
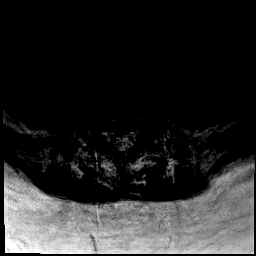
[im 10/14]
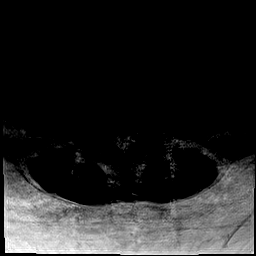
[im 14/14]
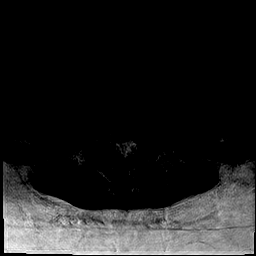

[Series 24: T2 · axial · 5.0mm · 0.70mm/px · z∈[-378,-265]mm · 6 of 17 slices shown (4 of 4)]
[im 1/17]
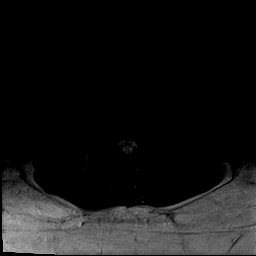
[im 4/17]
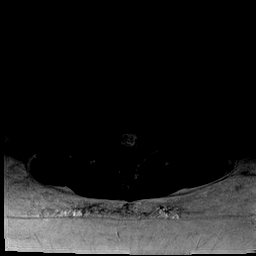
[im 7/17]
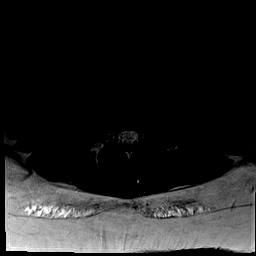
[im 10/17]
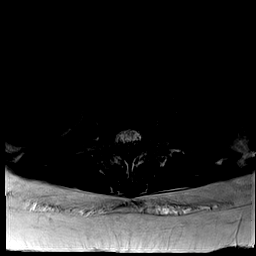
[im 13/17]
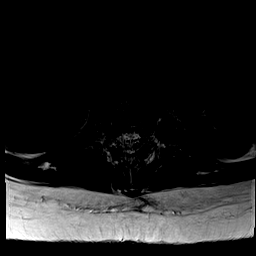
[im 17/17]
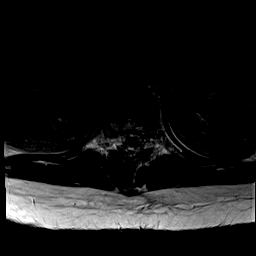

[Series 25: T1 · axial · 4.0mm · 0.35mm/px · z∈[-494,-430]mm · 5 of 14 slices shown (2 of 2)]
[im 1/14]
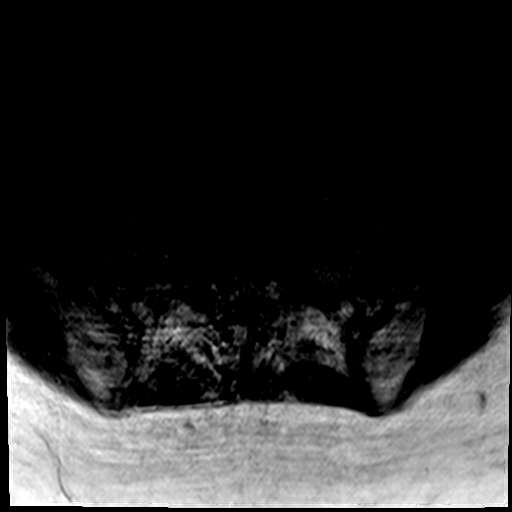
[im 4/14]
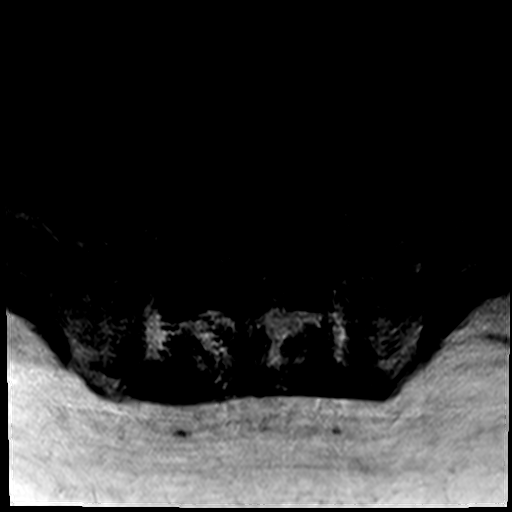
[im 7/14]
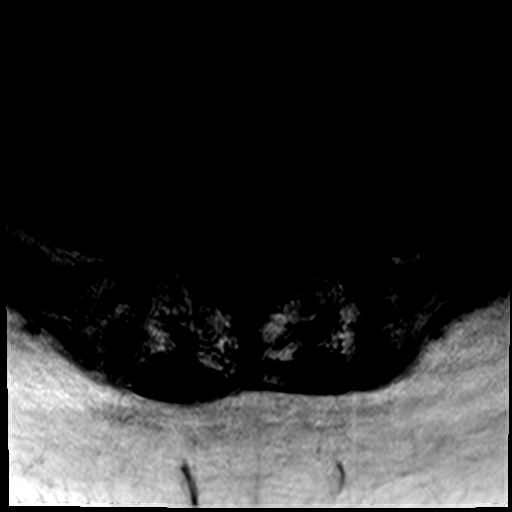
[im 10/14]
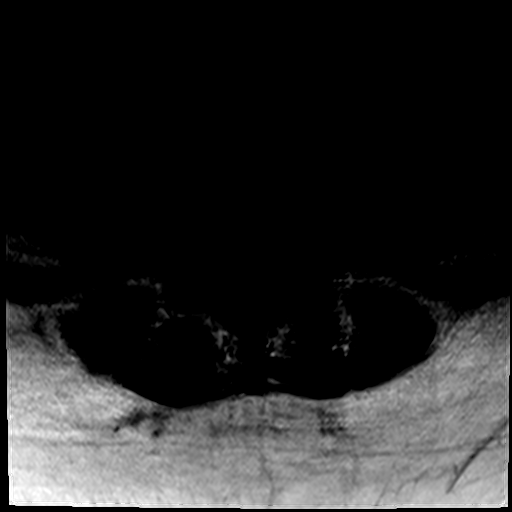
[im 14/14]
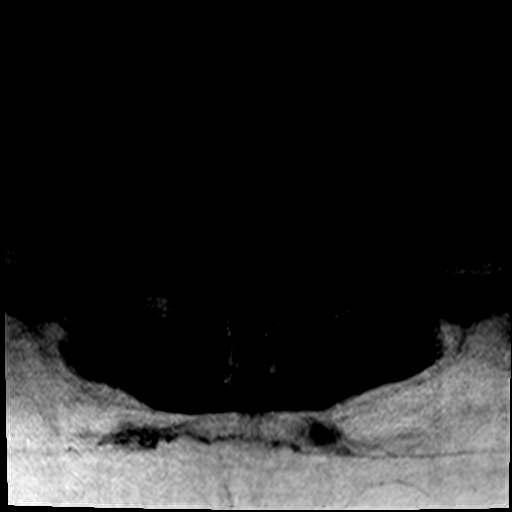

[34 of 48 positions shown; findings below may reference images not displayed]

FINDINGS: MRI THORACIC SPINE

Alignment:  Preserved kyphosis.

Vertebrae: Chronic mild compression deformity of T12 with cement
augmentation. Chronic minimal loss of height at the superior
endplate of T8. Thoracic vertebral body heights are otherwise
maintained. There is no marrow edema. Probable small hemangioma of
the T6 vertebral body.

Cord:  No abnormal signal.

Paraspinal and other soft tissues: Unremarkable.

Disc levels:

Intervertebral disc heights and signal are maintained. There is no
degenerative stenosis.

MRI LUMBAR SPINE

Significant motion artifact is present. Below findings are within
this limitation.

Segmentation: Standard.

Alignment:  Preserved lordosis and anteroposterior alignment.

Vertebrae: There is a fracture line through the mid L1 vertebral
body likely involving anterior and posterior margins and likely the
superior endplate where there is mild, less than 50% loss of height.
Marrow edema does not definitely extend into the posterior elements.
There is minor osseous retropulsion. Lumbar vertebral body heights
and signal are otherwise maintained.

Conus medullaris and cauda equina: Conus extends to the T12-L1
level. Conus and cauda equina appear normal.

Paraspinal and other soft tissues: Unremarkable.

Disc levels: Disc desiccation at L3-L4 and L4-L5. Possible mild disc
bulges at these levels. No significant degenerative stenosis
identified.
IMPRESSION: Lumbar spine imaging is significantly motion degraded.

Acute L1 fracture likely involving anterior and posterior margins as
well as the superior endplate where there is less than 50% loss of
height. Marrow edema does not definitely extend to the posterior
elements but involvement by fracture is difficult to exclude on MRI.
Minor osseous retropulsion.

Chronic thoracic findings detailed above.

## 2020-08-20 ENCOUNTER — Emergency Department (HOSPITAL_COMMUNITY): Payer: Medicaid Other

## 2020-08-20 ENCOUNTER — Telehealth: Payer: Self-pay

## 2020-08-20 ENCOUNTER — Ambulatory Visit
Admission: EM | Admit: 2020-08-20 | Discharge: 2020-08-20 | Disposition: A | Discharge status: Home / Self Care | Payer: Medicaid Other

## 2020-08-20 ENCOUNTER — Encounter (HOSPITAL_COMMUNITY): Payer: Self-pay

## 2020-08-20 ENCOUNTER — Emergency Department (HOSPITAL_COMMUNITY)
Admission: EM | Admit: 2020-08-20 | Discharge: 2020-08-20 | Disposition: A | Discharge status: Home / Self Care | Payer: Medicaid Other | Attending: Emergency Medicine | Admitting: Emergency Medicine

## 2020-08-20 ENCOUNTER — Other Ambulatory Visit: Payer: Self-pay

## 2020-08-20 DIAGNOSIS — R062 Wheezing: Secondary | ICD-10-CM | POA: Diagnosis not present

## 2020-08-20 DIAGNOSIS — R000 Tachycardia, unspecified: Secondary | ICD-10-CM | POA: Diagnosis not present

## 2020-08-20 DIAGNOSIS — F909 Attention-deficit hyperactivity disorder, unspecified type: Secondary | ICD-10-CM | POA: Insufficient documentation

## 2020-08-20 DIAGNOSIS — S32010A Wedge compression fracture of first lumbar vertebra, initial encounter for closed fracture: Secondary | ICD-10-CM | POA: Diagnosis not present

## 2020-08-20 DIAGNOSIS — E876 Hypokalemia: Secondary | ICD-10-CM

## 2020-08-20 DIAGNOSIS — R0689 Other abnormalities of breathing: Secondary | ICD-10-CM | POA: Diagnosis not present

## 2020-08-20 DIAGNOSIS — R911 Solitary pulmonary nodule: Secondary | ICD-10-CM | POA: Insufficient documentation

## 2020-08-20 DIAGNOSIS — R0789 Other chest pain: Secondary | ICD-10-CM | POA: Insufficient documentation

## 2020-08-20 DIAGNOSIS — M4856XA Collapsed vertebra, not elsewhere classified, lumbar region, initial encounter for fracture: Secondary | ICD-10-CM | POA: Diagnosis not present

## 2020-08-20 DIAGNOSIS — J45909 Unspecified asthma, uncomplicated: Secondary | ICD-10-CM | POA: Insufficient documentation

## 2020-08-20 DIAGNOSIS — R0902 Hypoxemia: Secondary | ICD-10-CM | POA: Diagnosis not present

## 2020-08-20 DIAGNOSIS — X58XXXA Exposure to other specified factors, initial encounter: Secondary | ICD-10-CM | POA: Diagnosis not present

## 2020-08-20 DIAGNOSIS — Z91040 Latex allergy status: Secondary | ICD-10-CM | POA: Insufficient documentation

## 2020-08-20 DIAGNOSIS — U071 COVID-19: Secondary | ICD-10-CM

## 2020-08-20 DIAGNOSIS — R0602 Shortness of breath: Secondary | ICD-10-CM | POA: Diagnosis present

## 2020-08-20 DIAGNOSIS — Z79899 Other long term (current) drug therapy: Secondary | ICD-10-CM | POA: Diagnosis not present

## 2020-08-20 DIAGNOSIS — F419 Anxiety disorder, unspecified: Secondary | ICD-10-CM | POA: Insufficient documentation

## 2020-08-20 DIAGNOSIS — J984 Other disorders of lung: Secondary | ICD-10-CM

## 2020-08-20 DIAGNOSIS — S32040A Wedge compression fracture of fourth lumbar vertebra, initial encounter for closed fracture: Secondary | ICD-10-CM | POA: Diagnosis not present

## 2020-08-20 DIAGNOSIS — R52 Pain, unspecified: Secondary | ICD-10-CM | POA: Diagnosis not present

## 2020-08-20 DIAGNOSIS — Z87891 Personal history of nicotine dependence: Secondary | ICD-10-CM | POA: Insufficient documentation

## 2020-08-20 DIAGNOSIS — Z7951 Long term (current) use of inhaled steroids: Secondary | ICD-10-CM | POA: Diagnosis not present

## 2020-08-20 DIAGNOSIS — R918 Other nonspecific abnormal finding of lung field: Secondary | ICD-10-CM | POA: Diagnosis not present

## 2020-08-20 HISTORY — DX: Unspecified asthma, uncomplicated: J45.909

## 2020-08-20 LAB — CBC
HCT: 41.8 % (ref 36.0–46.0)
Hemoglobin: 13.6 g/dL (ref 12.0–15.0)
MCH: 31.4 pg (ref 26.0–34.0)
MCHC: 32.5 g/dL (ref 30.0–36.0)
MCV: 96.5 fL (ref 80.0–100.0)
Platelets: 331 10*3/uL (ref 150–400)
RBC: 4.33 MIL/uL (ref 3.87–5.11)
RDW: 13 % (ref 11.5–15.5)
WBC: 7.8 10*3/uL (ref 4.0–10.5)
nRBC: 0 % (ref 0.0–0.2)

## 2020-08-20 LAB — BASIC METABOLIC PANEL
Anion gap: 14 (ref 5–15)
BUN: 9 mg/dL (ref 6–20)
CO2: 19 mmol/L — ABNORMAL LOW (ref 22–32)
Calcium: 8.7 mg/dL — ABNORMAL LOW (ref 8.9–10.3)
Chloride: 103 mmol/L (ref 98–111)
Creatinine, Ser: 0.65 mg/dL (ref 0.44–1.00)
GFR calc Af Amer: >60 mL/min (ref >60)
GFR calc non Af Amer: >60 mL/min (ref >60)
Glucose, Bld: 178 mg/dL — ABNORMAL HIGH (ref 70–99)
Potassium: 3 mmol/L — ABNORMAL LOW (ref 3.5–5.1)
Sodium: 136 mmol/L (ref 135–145)

## 2020-08-20 LAB — SARS CORONAVIRUS 2 BY RT PCR (HOSPITAL ORDER, PERFORMED IN ~~LOC~~ HOSPITAL LAB): SARS Coronavirus 2: POSITIVE — AB

## 2020-08-20 LAB — HCG, QUANTITATIVE, PREGNANCY: hCG, Beta Chain, Quant, S: <1 m[IU]/mL (ref <5)

## 2020-08-20 LAB — D-DIMER, QUANTITATIVE: D-Dimer, Quant: 0.4 ug/mL-FEU (ref 0.00–0.50)

## 2020-08-20 MED ORDER — POTASSIUM CHLORIDE CRYS ER 20 MEQ PO TBCR
40.0000 meq | EXTENDED_RELEASE_TABLET | Freq: Once | ORAL | Status: AC
  Administered 2020-08-20: 40 meq via ORAL
  Filled 2020-08-20: qty 2

## 2020-08-20 MED ORDER — HALOPERIDOL LACTATE 5 MG/ML IJ SOLN
2.5000 mg | Freq: Once | INTRAMUSCULAR | Status: AC
  Administered 2020-08-20: 2.5 mg via INTRAVENOUS
  Filled 2020-08-20: qty 1

## 2020-08-20 MED ORDER — AEROCHAMBER Z-STAT PLUS/MEDIUM MISC
1.0000 | Freq: Once | Status: AC
  Administered 2020-08-20: 1
  Filled 2020-08-20: qty 1

## 2020-08-20 MED ORDER — LEVALBUTEROL TARTRATE 45 MCG/ACT IN AERO
4.0000 | INHALATION_SPRAY | Freq: Once | RESPIRATORY_TRACT | Status: DC
  Filled 2020-08-20: qty 15

## 2020-08-20 MED ORDER — IOHEXOL 300 MG/ML  SOLN
75.0000 mL | Freq: Once | INTRAMUSCULAR | Status: AC | PRN
  Administered 2020-08-20: 55 mL via INTRAVENOUS

## 2020-08-20 MED ORDER — IOHEXOL 350 MG/ML SOLN: 100.0000 mL | Freq: Once | INTRAVENOUS | Status: DC | PRN

## 2020-08-20 MED ORDER — MAGNESIUM SULFATE 2 GM/50ML IV SOLN
2.0000 g | Freq: Once | INTRAVENOUS | Status: AC
  Administered 2020-08-20: 2 g via INTRAVENOUS
  Filled 2020-08-20: qty 50

## 2020-08-20 NOTE — ED Triage Notes (Signed)
Patient is being discharged from the Urgent Care and sent to the Emergency Department via ems  . Per Lollie Sails , patient is in need of higher level of care due to respiratory distress . Patient is aware and verbalizes understanding of plan of care. There were no vitals filed for this visit.

## 2020-08-20 NOTE — ED Provider Notes (Signed)
[facility] ([place]) EMERGENCY DEPARTMENT Provider Note   CSN: [id number] Arrival date & time: 08/20/20  1254     History Chief Complaint  Patient presents with  . Asthma    Penny [name] is a 34 y.o. female with a history as outlined below, most significant for asthma which was diagnosed last year in association with anxiety, panic attacks, history of gastric bypass surgery, presenting for evaluation of shortness of breath along with wheezing which started gradually yesterday.  Penny [name] has had multiple doses of her inhalers which include Xopenex and Symbicort which have not been helpful.  Penny [name] was seen in urgent care prior to arrival and Penny [name] was given Solu-Medrol 125 mg IM, also was given a DuoNeb treatment and a dose of epinephrine but continued to have shortness of breath.  Penny [name] also endorses a heavy tight sensation of her anterior bilateral chest which has been intermittent.  Her symptoms were gradual onset, Penny [name] denies peripheral edema or pain.  Penny [name] has had no fevers or chills.  No significant cough.  Penny [name] currently has difficulty speaking in complete sentences.  The history is provided by the patient.       Past Medical History:  Diagnosis Date  . ADD (attention deficit disorder)   . Anemia 2011   2o to GASTRIC BYPASS  . Anginal pain ([place])   . Anxiety   . Asthma   . Blood transfusion without reported diagnosis   . Chronic daily headache   . Depression   . Depression   . Dysmenorrhea 09/18/2013  . Dysrhythmia   . Elevated liver enzymes JUL 2011ALK PHOS 111-127 AST  143-267 ALT  213-321T BILI 0.6  ALB  3.7-4.06 Jun 2011 ALK PHOS 118 AST 24 ALT 42 T BILI 0.4 ALB 3.9  . Encounter for drug rehabilitation    at behavioral health for opioid addiction about 4 years ago  . Family history of adverse reaction to anesthesia    'dad had to be kept on pump for breathing for morphine'  . Fatty liver   . Fibroid 01/18/2017  . Fibromyalgia   . Gastric bypass status for obesity   . Gastritis JULY  2011  . Heart murmur   . 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 History of Holter monitoring   . Hx of opioid abuse ([place])    for about 4 years, about 4 years ago  . Interstitial cystitis   . Iron deficiency anemia 07/23/2010  . Irritable bowel syndrome 2012 DIARRHEA   JUN 2012 TTG IgA 14.9  . IUD FEB 2010  . Lupus ([place])   . Menorrhagia 07/18/2013  . Migraines   . Obesity (BMI 30-39.9) 2011 228 LBS BMI 36.8  . Ovarian cyst   . Patient desires pregnancy 09/18/2013  . Polyneuropathy   . PONV (postoperative nausea and vomiting) seizure post-operatively   seizure following ankle surgery  . Potassium (K) deficiency   . Pregnant 12/25/2013  . Psychiatric pseudoseizure   . RLQ abdominal pain 07/18/2013  . Sciatica of left side 11/14/2014  . 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 Stress 09/18/2013    Patient Active Problem List   Diagnosis Date Noted  . Major depressive disorder, recurrent episode ([place]) 06/22/2020  . Major depression 06/19/2020  . Dehydration   . Intractable abdominal pain 09/19/2019  . Intractable nausea and vomiting 09/19/2019  . Gastroesophageal reflux disease without esophagitis   . Cannabis abuse 08/30/2019  . Dyspnea   . Severe anxiety 08/21/2019  .  Hematemesis with nausea 04/23/2019  . Absolute anemia 04/23/2019  . Urge incontinence 10/26/2018  . S/P kyphoplasty 08/02/2018  . Herpes genitalis in women 12/19/2017  . Cannabis use disorder, moderate, dependence ([place]) 07/07/2017  . Abnormal urine odor 03/30/2017  . Urinary tract infection with hematuria 03/30/2017  . Screening for genitourinary condition 03/30/2017  . Pain 03/30/2017  . Burning with urination 03/30/2017  . Family history of breast cancer in mother,uncertain BR CA status 02/28/2017  . Fibroid 01/18/2017  . Chronic pain syndrome 12/05/2016  . Folate deficiency 12/04/2016  . Nausea vomiting and diarrhea 12/04/2016  . Severe anemia 12/04/2016  . Symptomatic  anemia 12/03/2016  . Anastomotic ulcer 04/22/2016  . Pain medication agreement signed 04/22/2016  . Complaints of leg weakness   . Paresthesia   . Left-sided weakness   . Uncontrolled pain 01/24/2016  . Malnutrition of moderate degree 01/24/2016  . Neuropathy 01/23/2016  . Inability to walk 01/23/2016  . Paresthesias   . Bilateral leg numbness 11/26/2015  . Major depressive disorder, recurrent episode, 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[place]) 05/10/2015  . Post partum depression 05/09/2015  . Dysphagia   . Hematemesis 04/07/2015  . Intractable vomiting with nausea 04/07/2015  . Elevated liver enzymes   . Epigastric pain   . 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 Seizure-like activity ([place]) 02/22/2015  . Fibromyalgia 02/18/2015  . Vulvar fissure 02/18/2015  . Clinical depression 02/01/2015  . Current smoker 01/29/2015  . Current tobacco use 01/29/2015  . 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 Leg weakness, bilateral 01/02/2015  . Gait disorder 01/02/2015  . Other symptoms and signs involving the musculoskeletal system 01/02/2015  . Abnormal gait 01/02/2015  . Cervicalgia 12/18/2014  . Sciatica of left side 11/14/2014  . Neuralgia neuritis, sciatic nerve 11/14/2014  . 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 Encounter for sterilization 09/09/2014  . Abdominal pain, acute, right lower quadrant 08/22/2014  . Headache, migraine 08/19/2014  . Seizure ([place]) 08/18/2014  . Encephalopathy 08/13/2014  . Headache 08/13/2014  . Altered mental status 08/13/2014  . Right leg numbness 07/31/2014  . Disturbance of skin sensation 07/31/2014  . Hypertension in pregnancy, transient 07/08/2014  . Previous gastric bypass affecting pregnancy, antepartum 05/22/2014  . History of bariatric surgery 05/22/2014  . 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 Antepartum mental disorder in pregnancy 05/09/2014  . Rapid palpitations  02/18/2014  . H/O maternal third degree perineal laceration, currently pregnant 02/18/2014  . High-risk pregnancy 02/18/2014  . Supervision of pregnancy with other poor reproductive or obstetric history, unspecified trimester 02/18/2014  . Restless legs 01/16/2014  . Restless leg 01/16/2014  . Chronic interstitial cystitis 10/23/2013  . Menorrhagia 07/18/2013  . Excessive and frequent menstruation 07/18/2013  . h/o Opiate addiction 03/11/2012    Class: Acute  . History of migraine headaches 03/10/2012    Class: Acute  . Depression with anxiety 03/10/2012    Class: Chronic  . 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 Attention deficit hyperactivity disorder, 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    Class: Chronic  . Panic disorder without agoraphobia 03/10/2012  . H/O disease 03/10/2012  . Dysthymia 03/10/2012  . 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 Other specified behavioral and emotional disorders with onset usually occurring in childhood and adolescence 03/10/2012  . Pelvic congestion syndrome 10/13/2011  . Coitalgia 10/13/2011  . Chronic migraine without aura 10/13/2011  .  Unspecified dyspareunia 10/13/2011  . Other specified conditions associated with female genital organs and menstrual cycle 10/13/2011  . IBS (irritable bowel syndrome) 08/25/2011  . Diarrhea 05/27/2011  . OBESITY, UNSPECIFIED 09/17/2010  . Transaminitis 09/17/2010  . IDA (iron deficiency anemia) 07/23/2010    Past Surgical History:  Procedure Laterality Date  . BIOPSY  04/10/2015   Procedure: BIOPSY;  Surgeon: Daneil Dolin, MD;  Location: [facility];  Service: Endoscopy;;  . cath self every nite     for sodium bicarb injection (discontinued 2013)  . CHOLECYSTECTOMY  2005   biliary dyskinesia  . COLONOSCOPY  JUN 2012 ABD PN/DIARRHEA WITH PROPOFOL   NL COLON  . DILATION AND CURETTAGE OF UTERUS    . DILITATION &  CURRETTAGE/HYSTROSCOPY WITH NOVASURE ABLATION N/A 03/24/2017   Procedure: DILATATION & CURETTAGE/HYSTEROSCOPY WITH NOVASURE ENDOMETRIAL ABLATION;  Surgeon: Jonnie Kind, MD;  Location: [facility];  Service: Gynecology;  Laterality: N/A;  . ESOPHAGEAL DILATION N/A 04/10/2015   Procedure: ESOPHAGEAL DILATION WITH 54FR MALONEY DILATOR;  Surgeon: Daneil Dolin, MD;  Location: [facility];  Service: Endoscopy;  Laterality: N/A;  . ESOPHAGOGASTRODUODENOSCOPY    . ESOPHAGOGASTRODUODENOSCOPY (EGD) WITH PROPOFOL N/A 04/10/2015   Procedure: ESOPHAGOGASTRODUODENOSCOPY (EGD) WITH PROPOFOL;  Surgeon: Daneil Dolin, MD;  Location: [facility];  Service: Endoscopy;  Laterality: N/A;  . ESOPHAGOGASTRODUODENOSCOPY (EGD) WITH PROPOFOL N/A 12/06/2016   Procedure: ESOPHAGOGASTRODUODENOSCOPY (EGD) WITH PROPOFOL;  Surgeon: Danie Binder, MD;  Location[facility];  Service: Endoscopy;  Laterality: N/A;  . ESOPHAGOGASTRODUODENOSCOPY (EGD) WITH PROPOFOL N/A 04/27/2019   Procedure: ESOPHAGOGASTRODUODENOSCOPY (EGD) WITH PROPOFOL;  Surgeon: Rogene Houston, MD;  [facility];  Service: Endoscopy;  Laterality: N/A;  730  . ESOPHAGOGASTRODUODENOSCOPY (EGD) WITH PROPOFOL N/A 08/31/2019   Procedure: ESOPHAGOGASTRODUODENOSCOPY (EGD) WITH PROPOFOL;  Surgeon: Rogene Houston, MD;  Location[facility];  Service: Endoscopy;  Laterality: N/A;  . GAB  2007   in [place]-POUCH 5 CM  . GASTRIC BYPASS  06/2006  . HYSTEROSCOPY WITH D & C N/A 09/12/2014   Procedure: DILATATION AND CURETTAGE /HYSTEROSCOPY;  Surgeon: Jonnie Kind, MD;  Location: [facility];  Service: Gynecology;  Laterality: N/A;  . KYPHOPLASTY N/A 08/02/2018   Procedure: KYPHOPLASTY T12;  Surgeon: Melina Schools, MD;  Location: [place];  Service: Orthopedics;  Laterality: N/A;  60 mins  . LAPAROSCOPIC TUBAL LIGATION Bilateral 03/24/2017   Procedure: LAPAROSCOPIC TUBAL LIGATION ([place] Rings);  Surgeon: Jonnie Kind, MD;  Location: [facility];  Service: Gynecology;   Laterality: Bilateral;  . REPAIR VAGINAL CUFF N/A 07/30/2014   Procedure: REPAIR VAGINAL CUFF;  Surgeon: Mora Bellman, MD;  Location: [place] [facility];  Service: Gynecology;  Laterality: N/A;  . SAVORY DILATION  06/20/2012   Dr. Barnie Alderman gastritis/Ulcer in the mid jejunum. Empiric dilation.   . SURAL NERVE BX Left 02/25/2016   Procedure: LEFT SURAL NERVE BIOPSY;  Surgeon: Jovita Gamma, MD;  Location: [place] [facility];  Service: Neurosurgery;  Laterality: Left;  Left sural nerve biopsy  . TONSILLECTOMY    . TONSILLECTOMY AND ADENOIDECTOMY    . UPPER GASTROINTESTINAL ENDOSCOPY  JULY 2011 NAUSEA-D125,V6, PH 25   Bx; GASTRITIS, POUCH-5 CM LONG  . WISDOM TOOTH EXTRACTION       OB History    Gravida  3   Para  2   Term  1   Preterm  1   AB  1   Living  2     SAB  1  TAB      Ectopic      Multiple      Live Births  2           Family History  Problem Relation Age of Onset  . Hemochromatosis Maternal Grandmother   . Migraines Maternal Grandmother   . Cancer Maternal Grandmother   . Breast cancer Maternal Grandmother   . Hypertension Father   . Diabetes Father   . Coronary artery disease Father   . Migraines Paternal Grandmother   . Breast cancer Paternal Grandmother   . Cancer Mother        breast  . Hemochromatosis Mother   . Breast cancer Mother   . Depression Mother   . Anxiety disorder Mother   . Coronary artery disease Paternal Grandfather   . Anxiety disorder Brother   . Bipolar disorder Brother   . Healthy Daughter   . Healthy Son     Social History   Tobacco Use  . Smoking status: Former Smoker    Packs/day: 0.25    Years: 6.00    Pack years: 1.50    Types: Cigarettes    Start date: 05/2018  . Smokeless tobacco: Never Used  Vaping Use  . Vaping Use: Former  Substance Use Topics  . Alcohol use: Yes    Alcohol/week: 0.0 standard drinks  . Drug use: Not Currently    Home Medications Prior to Admission medications   Medication Sig Start Date  End Date Taking? Authorizing Provider  albuterol (PROVENTIL) (2.5 MG/3ML) 0.083% nebulizer solution Take 3 mLs (2.5 mg total) by nebulization every 4 (four) hours as needed for wheezing. 10/08/19   Babs Sciara, MD  baclofen 5 MG TABS Take 5 mg by mouth 3 (three) times daily as needed for muscle spasms. 06/23/20   Aldean Baker, NP  BELBUCA 75 MCG FILM Take 1 strip by mouth 2 (two) times daily. Patient not taking: Reported on 07/03/2020 06/11/20   [provider]  budesonide-formoterol (SYMBICORT) 160-4.5 MCG/ACT inhaler Inhale 2 puffs into the lungs 2 (two) times daily. 05/23/20   Alfonse Spruce, MD  busPIRone (BUSPAR) 5 MG tablet Take 1 tablet (5 mg total) by mouth 3 (three) times daily. 07/09/20   Myrlene Broker, MD  clonazePAM (KLONOPIN) 0.5 MG tablet Take 1 tablet (0.5 mg total) by mouth 2 (two) times daily. 07/09/20 07/09/21  Myrlene Broker, MD  divalproex (DEPAKOTE) 500 MG DR tablet Take 1 tablet (500 mg total) by mouth every 12 (twelve) hours. 07/09/20   Myrlene Broker, MD  DULoxetine (CYMBALTA) 60 MG capsule Take 1 capsule (60 mg total) by mouth 2 (two) times daily. 07/09/20   Myrlene Broker, MD  HYDROmorphone (DILAUDID) 2 MG tablet Take 2 tablets (4 mg total) by mouth every 12 (twelve) hours as needed for severe pain. Take 1-2 tablets po q 4 hrs prn pain no greater than 9 tablets/day 06/23/20   Aldean Baker, NP  lansoprazole (PREVACID) 30 MG capsule Take 1 capsule (30 mg total) by mouth daily. 07/21/20   Babs Sciara, MD  pantoprazole (PROTONIX) 40 MG tablet TAKE 1 TABLET BY MOUTH TWICE DAILY BEFORE MEALS. Patient taking differently: Take 40 mg by mouth 2 (two) times daily.  05/30/20   Babs Sciara, MD  promethazine (PHENERGAN) 25 MG tablet TAKE (1) TABLET BY MOUTH TWICE A DAY AS NEEDED. Patient taking differently: Take 25 mg by mouth 2 (two) times daily as needed. TAKE (1) TABLET BY  MOUTH TWICE A DAY AS NEEDED. 04/14/20   Kathyrn Drown, MD  topiramate (TOPAMAX) 100 MG tablet  Take 1 tablet (100 mg total) by mouth 2 (two) times daily. 06/16/20 06/16/21  Cloria Spring, MD  traZODone (DESYREL) 150 MG tablet Take 1 tablet (150 mg total) by mouth at bedtime. 07/09/20   Cloria Spring, MD  valACYclovir (VALTREX) 1000 MG tablet TAKE 1 TABLET BY MOUTH DAILY. 08/08/20   Estill Dooms, NP    Allergies    Gabapentin, Metoclopramide hcl, Other, Tramadol, Ativan [lorazepam], Latex, Lyrica [pregabalin], Trazodone and nefazodone, Zofran [ondansetron], Iron, Sulfa antibiotics, Butrans [buprenorphine], Nucynta [tapentadol], Propofol, Tape, and Toradol [ketorolac tromethamine]  Review of Systems   Review of Systems  Constitutional: Negative for chills and fever.  HENT: Negative for congestion and sore throat.   Eyes: Negative.   Respiratory: Positive for chest tightness and shortness of breath.   Cardiovascular: Negative for chest pain.  Gastrointestinal: Negative for abdominal pain, nausea and vomiting.  Genitourinary: Negative.   Musculoskeletal: Negative for arthralgias, joint swelling and neck pain.  Skin: Negative.  Negative for rash and wound.  Neurological: Negative for dizziness, weakness, light-headedness, numbness and headaches.  Psychiatric/Behavioral: The patient is nervous/anxious.     Physical Exam Updated Vital Signs BP (!) 97/58   Pulse 75   Temp 98.7 F (37.1 C) (Oral)   Resp (!) 23   Ht $R'5\' 6"'Kr$  (1.676 m)   Wt 77.1 kg   SpO2 97%   BMI 27.44 kg/m   Physical Exam Vitals and nursing note reviewed.  Constitutional:      Appearance: Penny [name] is well-developed.  HENT:     Head: Normocephalic and atraumatic.  Eyes:     Conjunctiva/sclera: Conjunctivae normal.  Cardiovascular:     Rate and Rhythm: Regular rhythm. Tachycardia present.     Heart sounds: Normal heart sounds.  Pulmonary:     Effort: Pulmonary effort is normal.     Breath sounds: Normal breath sounds. No stridor. No wheezing, rhonchi or rales.     Comments: Breath sounds are normal and  full, no wheezing.  Penny [name] is tachypneic.  No sensory muscle use.  Penny [name] is not tripoding.  No stridor.  Not speaking in full sentences, speaking in a very soft whispered voice. Chest:     Chest wall: No tenderness.  Abdominal:     General: Bowel sounds are normal.     Palpations: Abdomen is soft.     Tenderness: There is no abdominal tenderness.  Musculoskeletal:        General: No tenderness. Normal range of motion.     Cervical back: Normal range of motion.     Right lower leg: No edema.     Left lower leg: No edema.  Skin:    General: Skin is warm and dry.  Neurological:     General: No focal deficit present.     Mental Status: Penny [name] is alert and oriented to person, place, and time.  Psychiatric:     Comments: Anxious     ED Results / Procedures / Treatments   Labs (all labs ordered are listed, but only abnormal results are displayed) Labs Reviewed  SARS CORONAVIRUS 2 BY RT PCR (HOSPITAL ORDER, [facility]) - Abnormal; Notable for the following components:      Result Value   SARS Coronavirus 2 POSITIVE (*)    All other components within normal limits  BASIC METABOLIC PANEL - Abnormal; Notable for the  following components:   Potassium 3.0 (*)    CO2 19 (*)    Glucose, Bld 178 (*)    Calcium 8.7 (*)    All other components within normal limits  CBC  HCG, QUANTITATIVE, PREGNANCY  D-DIMER, QUANTITATIVE (NOT AT [facility])    EKG None  Radiology CT Chest W Contrast  Result Date: 08/20/2020 CLINICAL DATA:  Pulmonary nodule, abnormal chest x-ray EXAM: CT CHEST WITH CONTRAST TECHNIQUE: Multidetector CT imaging of the chest was performed during intravenous contrast administration. CONTRAST:  9mL OMNIPAQUE IOHEXOL 300 MG/ML  SOLN COMPARISON:  None. FINDINGS: Cardiovascular: No significant vascular findings. Normal heart size. No pericardial effusion. Mediastinum/Nodes: No enlarged mediastinal, hilar, or axillary lymph nodes. Thyroid gland, trachea, and  esophagus demonstrate no significant findings. Lungs/Pleura: The abnormality on recent chest radiograph represents a cavitary pulmonary nodule measuring 2.2 x 2.9 cm on axial image # 39 demonstrating a small air-fluid level and a relatively uniform soft tissue rind. While nonspecific, this suggests a post infectious or post inflammatory etiology. Scattered peribronchial infiltrates are seen within the adjacent right upper lobe suggesting an infectious or inflammatory etiology. There is scattered ground-glass and nodular infiltrates noted within the lower lobes bilaterally, right greater than left, likely infectious or inflammatory in etiology. No pneumothorax or pleural effusion. The central airways are widely patent. Upper Abdomen: Surgical changes of gastric bypass and cholecystectomy are identified. Musculoskeletal: T12 vertebroplasty has been performed. There is a a severe wedge compression fracture of L1, new since prior CT examination, with approximately 60% loss of height. There is no retropulsion identified. There is no significant paravertebral soft tissue swelling to suggest an acute fracture. IMPRESSION: 1. The abnormality on recent chest radiograph represents a cavitary pulmonary nodule measuring 2.2 x 2.9 cm, possibly result of recent infection. Follow-up to resolution is recommended. 2. Scattered ground-glass and nodular infiltrates within the right upper lobe and bilateral lower lobes, right greater than left, likely infectious or inflammatory in etiology. 3. Severe wedge compression fracture of L1, new since prior CT examination but likely chronic in acuity, with approximately 60% loss of height. There is no retropulsion identified. Electronically Signed   By: Helyn Numbers MD   On: 08/20/2020 19:56   DG Chest Portable 1 View  Result Date: 08/20/2020 CLINICAL DATA:  Shortness of breath, concern for asthma attack EXAM: PORTABLE CHEST 1 VIEW COMPARISON:  CT angiography 10/20/2019, radiograph  06/17/2020 FINDINGS: Cavitary appearing lesion is seen in the right upper lung. Mildly coarsened reticular changes in the lungs are similar to comparison studies and may reflect patient's underlying reactive airways disease. The cardiomediastinal contours are unremarkable. Prior vertebral augmentation at the thoracolumbar junction. Surgical material seen in the right upper quadrant as well. No other acute or significant soft tissue or osseous abnormality. IMPRESSION: Cavitary appearing lesion in the right upper lung, most concerning for infectious etiology given rapid interval development since July and patient's age. Recommend further evaluation with cross-sectional imaging. Coarsened reticular changes in the lungs are similar to comparison studies and may reflect patient's underlying reactive airways disease. Electronically Signed   By: Kreg Shropshire M.D.   On: 08/20/2020 15:22    Procedures Procedures (including critical care time)  Medications Ordered in ED Medications  levalbuterol (XOPENEX HFA) inhaler 4 puff (has no administration in time range)  aerochamber Z-Stat Plus/medium 1 each (1 each Other Given 08/20/20 1608)  magnesium sulfate IVPB 2 g 50 mL (0 g Intravenous Stopped 08/20/20 1918)  haloperidol lactate (HALDOL) injection 2.5 mg (2.5 mg  Intravenous Given 08/20/20 1645)  potassium chloride SA (KLOR-CON) CR tablet 40 mEq (40 mEq Oral Given 08/20/20 1644)  iohexol (OMNIPAQUE) 300 MG/ML solution 75 mL (55 mLs Intravenous Contrast Given 08/20/20 1937)    ED Course  I have reviewed the triage vital signs and the nursing notes.  Pertinent labs & imaging results that were available during my care of the patient were reviewed by me and considered in my medical decision making (see chart for details).    MDM Rules/Calculators/A&P                          Labs and imaging reviewed and discussed with patient.  Penny [name] is Covid positive today, however Penny [name] does not have any hypoxia here.  Penny [name]  initially presented with tachycardia, however Penny [name] had received a dose of epinephrine at the urgent care center prior to arrival, probable side effect from this medication.  Penny [name] endorsed midsternal chest pressure, therefore D-dimer was obtained and was normal range which was reassuring.  Patient was very anxious, Penny [name] was given an anxiety dose of Haldol and her vital signs normalized and Penny [name] was much more comfortable for the remainder of the ED stay.  Penny [name] underwent a CT chest given the cavitary lesion noted on her plain x-ray.  This was revealing for a probable old healing infection, no abscess or active infection.  Penny [name] does have groundglass appearance of her lungs suggesting her Covid infection.  This diagnosis was discussed at length, Penny [name] was given information regarding home treatment and need for quarantine per CDC recommendations.  Penny [name] was also given strict return precautions.  Discussed the monoclonal antibody treatment and Penny [name] was interested in this, I have contacted the [facility] clinic and they will contact her.  Penny [name] was advised that Penny [name] will need repeat imaging to ensure resolution of this pulmonary cavitary lesion, Penny [name] can discuss this with her primary MD.  Additionally Penny [name] did have some mild hypokalemia, this was replaced with oral potassium.  L1 compression fracture also noted on CT scanning.  Patient is aware of this diagnosis.  Penny [name] is under chronic pain management for this condition.  Jesselle Munyan was evaluated in Emergency Department on 08/20/2020 for the symptoms described in the history of present illness. Penny [name] was evaluated in the context of the global COVID-19 pandemic, which necessitated consideration that the patient might be at risk for infection with the SARS-CoV-2 virus that causes COVID-19. Institutional protocols and algorithms that pertain to the evaluation of patients at risk for COVID-19 are in a state of rapid change based on information released by regulatory bodies including the CDC and  federal and state organizations. These policies and algorithms were followed during the patient's care in the ED.  Final Clinical Impression(s) / ED Diagnoses Final diagnoses:  COVID-19  Compression fracture of L1 vertebra, initial encounter [facility]  Cavitary lesion of lung    Rx / DC Orders ED Discharge Orders    None       Landis Martins 08/20/20 2145    Margette Fast, MD 08/25/20 1615

## 2020-08-20 NOTE — Discharge Instructions (Addendum)
You have tested positive for Covid 19, however, your oxygen levels here have been stable and your exam is reassuring that you should be able to do well with this infection by treating your self at home.  Rest and make sure you are drinking plenty of fluids. Take motrin or tylenol for any fever or body aches.  Continue using your xopenex using the spacer given for better use of this inhaler.  You will need to maintain home quarantine for the next 9 days (10 days from onset of symptoms per CDC recommendations.    You may benefit from receiving a monoclonal antibody medication that is a one time IV treatment to help your body fight this infection easier and to prevent your symptoms from getting worse.  You have been referred to this clinic which is at  [facility] - expect a phone call from them to get this scheduled if you decide you want to have this.  Get rechecked if you develop any worsened shortness of breath or weakness.   Incidentally, you appear to have a compression fracture in your L1 vertebrae (the one immediately below your kyphoplasty surgery at T12),  this appears old, but is new since your last comparison xrays. If this is causing symptoms, I suggest contacting Dr. Rolena Infante.  The cavitary lesion in your right lung appears to be an old infection that is healing, not a new infection - this will require followup imaging to ensure it completely resolves.  Let your primary MD know about this finding so he can follow this.

## 2020-08-20 NOTE — ED Triage Notes (Signed)
Pt presents in wheelchair with sob and is unable to talk, pt o2 sats 86 % pt placed on non re breather at 15l came up to 96 %, given 125 mg solumedrol im, is refusing to go to ED

## 2020-08-20 NOTE — Telephone Encounter (Signed)
Patient is having extreme shortness of breath and is using Mucinex to dry up secretion. Patient states she has a heavy chest and doing things make difficult for her to breath. Patient was advise on last office visit that she needed to do 4 puffs of her Xopenex to help. Patient is in the bed and having issues. During our conversation patient is administering her rescue inhaler..... Patient had a cold running in the house and hasn't been COVID tested. Spoke with provider while speaking with patient and advise patient to go to Urgent Care to be access with her symptoms. Patient stated she will try to go, so I advise to call 9-1-1 for further problems.

## 2020-08-20 NOTE — Telephone Encounter (Signed)
Agree with the plan. I discussed the recommendations with Garlon Hatchet. We will call him tomorrow to check on him.  Salvatore Marvel, MD [facility] [place]

## 2020-08-20 NOTE — Telephone Encounter (Signed)
We will call her to follow up.

## 2020-08-20 NOTE — ED Notes (Signed)
Date and time results received: 08/20/20 1720 (use smartphrase ".now" to insert current time)  Test: covid Critical Value: pos  Name of Provider Notified: Almyra Free

## 2020-08-20 NOTE — ED Triage Notes (Signed)
Pt to er via ems, per ems pt was at urgent care and being seen for an asthma attack, states that her O2 sat was low on arrival, states that they gave her 125 of im solumedrol, states that when he arrived she was very tight and he gave some epi to open her lungs up, also gave a duo neb and pt is now satting 95 on room air.  Pt states that this feels like her asthma.  Pt talking in very short sentences.

## 2020-08-21 ENCOUNTER — Telehealth: Payer: Self-pay | Admitting: *Deleted

## 2020-08-21 ENCOUNTER — Encounter: Payer: Self-pay | Admitting: Nurse Practitioner

## 2020-08-21 ENCOUNTER — Other Ambulatory Visit (HOSPITAL_COMMUNITY): Payer: Self-pay | Admitting: Nurse Practitioner

## 2020-08-21 DIAGNOSIS — U071 COVID-19: Secondary | ICD-10-CM

## 2020-08-21 NOTE — Progress Notes (Signed)
I connected by phone with Penny [name] on 08/21/2020 at 10:51 AM to discuss the potential use of an new treatment for mild to moderate COVID-19 viral infection in non-hospitalized patients.  This patient is a 34 y.o. female that meets the FDA criteria for Emergency Use Authorization of casirivimab\imdevimab.  Has a (+) direct SARS-CoV-2 viral test result  Has mild or moderate COVID-19   Is ? 34 years of age and weighs ? 40 kg  Is NOT hospitalized due to COVID-19  Is NOT requiring oxygen therapy or requiring an increase in baseline oxygen flow rate due to COVID-19  Is within 10 days of symptom onset  Has at least one of the high risk factor(s) for progression to severe COVID-19 and/or hospitalization as defined in EUA.  Specific high risk criteria : BMI > 25, Chronic Lung Disease and Other high risk medical condition per CDC:  SVI   I have spoken and communicated the following to the patient or parent/caregiver:  1. FDA has authorized the emergency use of casirivimab\imdevimab for the treatment of mild to moderate COVID-19 in adults and pediatric patients with positive results of direct SARS-CoV-2 viral testing who are 49 years of age and older weighing at least 40 kg, and who are at high risk for progressing to severe COVID-19 and/or hospitalization.  2. The significant known and potential risks and benefits of casirivimab\imdevimab, and the extent to which such potential risks and benefits are unknown.  3. Information on available alternative treatments and the risks and benefits of those alternatives, including clinical trials.  4. Patients treated with casirivimab\imdevimab should continue to self-isolate and use infection control measures (e.g., wear mask, isolate, social distance, avoid sharing personal items, clean and disinfect "high touch" surfaces, and frequent handwashing) according to CDC guidelines.   5. The patient or parent/caregiver has the option to accept or refuse  casirivimab\imdevimab .  After reviewing this information with the patient, The patient agreed to proceed with receiving casirivimab\imdevimab infusion and will be provided a copy of the Fact sheet prior to receiving the infusion.Beckey Rutter, [place], AGNP-C [phone] ([place])

## 2020-08-21 NOTE — Telephone Encounter (Signed)
Called and spoke with the patient and she stated that she did test positive for COVID. She stated that her cough is a bit worse but other than that not much has changed. I advised to the patient that I would send this message to you so that you were aware. Advised that if she has any further issues with her breathing to please call 911 for further assistance and evaluation. Patient verbalized understanding.

## 2020-08-21 NOTE — Telephone Encounter (Signed)
Penny [name] presented to the [facility] and left before being seen by the provider on 08/20/20.. The patient has been enrolled in an automated general discharge outreach program and 2 attempts to contact the patient will be made to follow up on their ED visit and subsequent needs. The care management team is available to provide assistance to this patient at any time.   Contacted patient to complete Transition of Care Assessment: Transition Care Management Follow-up Telephone Call  .Contacted patient to complete Transition of care Assessment for ED visit 08/20/20:: Transition Care Management Follow-up Telephone Call  Date of discharge and from where: 08/20/20, [facility]  How have you been since you were released from the hospital? "better"  Any questions or concerns? No  Items Reviewed:  Did the pt receive and understand the discharge instructions provided? Yes   Medications obtained and verified? Yes   Any new allergies since your discharge? No   Dietary orders reviewed? YES  Do you have support at home? Yes   Functional Questionnaire: (I = Independent and D = Dependent) ADLs: I  Bathing/Dressing- I  Meal Prep- I  Eating- I  Maintaining continence- I  Transferring/Ambulation- I  Managing Meds- I  Follow up appointments reviewed:   PCP Hospital f/u appt confirmed? No  patient to contact office of Dr [name] on 08/21/20 to schedule follow up appt  Specialist Hospital f/u appt confirmed? Yes  Scheduled to be sen at the  [facility] on 08/22/20 @1200   Are transportation arrangements needed? No   If their condition worsens, is the pt aware to call PCP or go to the Emergency Dept.? YES  Was the patient provided with contact information for the PCP's office or ED? YES  Was to pt encouraged to call back with questions or concerns? YES  Lenor Coffin, RN, BSN, [place] Patient [place] [phone]

## 2020-08-22 ENCOUNTER — Ambulatory Visit (HOSPITAL_COMMUNITY): Payer: Medicaid Other

## 2020-08-22 ENCOUNTER — Ambulatory Visit: Payer: Self-pay | Admitting: Allergy & Immunology

## 2020-08-22 ENCOUNTER — Ambulatory Visit (HOSPITAL_COMMUNITY)
Admission: RE | Admit: 2020-08-22 | Discharge: 2020-08-22 | Disposition: A | Discharge status: Home / Self Care | Payer: Medicaid Other | Source: Ambulatory Visit | Attending: Pulmonary Disease | Admitting: Pulmonary Disease

## 2020-08-22 ENCOUNTER — Ambulatory Visit (HOSPITAL_COMMUNITY): Payer: Self-pay

## 2020-08-22 DIAGNOSIS — U071 COVID-19: Secondary | ICD-10-CM | POA: Insufficient documentation

## 2020-08-22 MED ORDER — ALBUTEROL SULFATE HFA 108 (90 BASE) MCG/ACT IN AERS: 2.0000 | INHALATION_SPRAY | Freq: Once | RESPIRATORY_TRACT | Status: DC | PRN

## 2020-08-22 MED ORDER — EPINEPHRINE 0.3 MG/0.3ML IJ SOAJ: 0.3000 mg | Freq: Once | INTRAMUSCULAR | Status: DC | PRN

## 2020-08-22 MED ORDER — SODIUM CHLORIDE 0.9 % IV SOLN
1200.0000 mg | Freq: Once | INTRAVENOUS | Status: AC
  Administered 2020-08-22: 1200 mg via INTRAVENOUS

## 2020-08-22 MED ORDER — DIPHENHYDRAMINE HCL 50 MG/ML IJ SOLN: 50.0000 mg | Freq: Once | INTRAMUSCULAR | Status: DC | PRN

## 2020-08-22 MED ORDER — METHYLPREDNISOLONE SODIUM SUCC 125 MG IJ SOLR: 125.0000 mg | Freq: Once | INTRAMUSCULAR | Status: DC | PRN

## 2020-08-22 MED ORDER — SODIUM CHLORIDE 0.9 % IV SOLN: INTRAVENOUS | Status: DC | PRN

## 2020-08-22 MED ORDER — FAMOTIDINE IN NACL 20-0.9 MG/50ML-% IV SOLN: 20.0000 mg | Freq: Once | INTRAVENOUS | Status: DC | PRN

## 2020-08-22 NOTE — Discharge Instructions (Signed)
COVID-19 COVID-19 is a respiratory infection that is caused by a virus called severe acute respiratory syndrome coronavirus 2 (SARS-CoV-2). The disease is also known as coronavirus disease or novel coronavirus. In some people, the virus may not cause any symptoms. In others, it may cause a serious infection. The infection can get worse quickly and can lead to complications, such as:  Pneumonia, or infection of the lungs.  Acute respiratory distress syndrome or ARDS. This is a condition in which fluid build-up in the lungs prevents the lungs from filling with air and passing oxygen into the blood.  Acute respiratory failure. This is a condition in which there is not enough oxygen passing from the lungs to the body or when carbon dioxide is not passing from the lungs out of the body.  Sepsis or septic shock. This is a serious bodily reaction to an infection.  Blood clotting problems.  Secondary infections due to bacteria or fungus.  Organ failure. This is when your body's organs stop working. The virus that causes COVID-19 is contagious. This means that it can spread from person to person through droplets from coughs and sneezes (respiratory secretions). What are the causes? This illness is caused by a virus. You may catch the virus by:  Breathing in droplets from an infected person. Droplets can be spread by a person breathing, speaking, singing, coughing, or sneezing.  Touching something, like a table or a doorknob, that was exposed to the virus (contaminated) and then touching your mouth, nose, or eyes. What increases the risk? Risk for infection You are more likely to be infected with this virus if you:  Are within 6 feet (2 meters) of a person with COVID-19.  Provide care for or live with a person who is infected with COVID-19.  Spend time in crowded indoor spaces or live in shared housing. Risk for serious illness You are more likely to become seriously ill from the virus if  you:  Are 50 years of age or older. The higher your age, the more you are at risk for serious illness.  Live in a nursing home or long-term care facility.  Have cancer.  Have a long-term (chronic) disease such as: ? Chronic lung disease, including chronic obstructive pulmonary disease or asthma. ? A long-term disease that lowers your body's ability to fight infection (immunocompromised). ? Heart disease, including heart failure, a condition in which the arteries that lead to the heart become narrow or blocked (coronary artery disease), a disease which makes the heart muscle thick, weak, or stiff (cardiomyopathy). ? Diabetes. ? Chronic kidney disease. ? Sickle cell disease, a condition in which red blood cells have an abnormal "sickle" shape. ? Liver disease.  Are obese. What are the signs or symptoms? Symptoms of this condition can range from mild to severe. Symptoms may appear any time from 2 to 14 days after being exposed to the virus. They include:  A fever or chills.  A cough.  Difficulty breathing.  Headaches, body aches, or muscle aches.  Runny or stuffy (congested) nose.  A sore throat.  New loss of taste or smell. Some people may also have stomach problems, such as nausea, vomiting, or diarrhea. Other people may not have any symptoms of COVID-19. How is this diagnosed? This condition may be diagnosed based on:  Your signs and symptoms, especially if: ? You live in an area with a COVID-19 outbreak. ? You recently traveled to or from an area where the virus is common. ? You   provide care for or live with a person who was diagnosed with COVID-19. ? You were exposed to a person who was diagnosed with COVID-19.  A physical exam.  Lab tests, which may include: ? Taking a sample of fluid from the back of your nose and throat (nasopharyngeal fluid), your nose, or your throat using a swab. ? A sample of mucus from your lungs (sputum). ? Blood tests.  Imaging tests,  which may include, X-rays, CT scan, or ultrasound. How is this treated? At present, there is no medicine to treat COVID-19. Medicines that treat other diseases are being used on a trial basis to see if they are effective against COVID-19. Your health care provider will talk with you about ways to treat your symptoms. For most people, the infection is mild and can be managed at home with rest, fluids, and over-the-counter medicines. Treatment for a serious infection usually takes places in a hospital intensive care unit (ICU). It may include one or more of the following treatments. These treatments are given until your symptoms improve.  Receiving fluids and medicines through an IV.  Supplemental oxygen. Extra oxygen is given through a tube in the nose, a face mask, or a hood.  Positioning you to lie on your stomach (prone position). This makes it easier for oxygen to get into the lungs.  Continuous positive airway pressure (CPAP) or bi-level positive airway pressure (BPAP) machine. This treatment uses mild air pressure to keep the airways open. A tube that is connected to a motor delivers oxygen to the body.  Ventilator. This treatment moves air into and out of the lungs by using a tube that is placed in your windpipe.  Tracheostomy. This is a procedure to create a hole in the neck so that a breathing tube can be inserted.  Extracorporeal membrane oxygenation (ECMO). This procedure gives the lungs a chance to recover by taking over the functions of the heart and lungs. It supplies oxygen to the body and removes carbon dioxide. Follow these instructions at home: Lifestyle  If you are sick, stay home except to get medical care. Your health care provider will tell you how long to stay home. Call your health care provider before you go for medical care.  Rest at home as told by your health care provider.  Do not use any products that contain nicotine or tobacco, such as cigarettes,  e-cigarettes, and chewing tobacco. If you need help quitting, ask your health care provider.  Return to your normal activities as told by your health care provider. Ask your health care provider what activities are safe for you. General instructions  Take over-the-counter and prescription medicines only as told by your health care provider.  Drink enough fluid to keep your urine pale yellow.  Keep all follow-up visits as told by your health care provider. This is important. How is this prevented?  There is no vaccine to help prevent COVID-19 infection. However, there are steps you can take to protect yourself and others from this virus. To protect yourself:   Do not travel to areas where COVID-19 is a risk. The areas where COVID-19 is reported change often. To identify high-risk areas and travel restrictions, check the CDC travel website: wwwnc.cdc.gov/travel/notices  If you live in, or must travel to, an area where COVID-19 is a risk, take precautions to avoid infection. ? Stay away from people who are sick. ? Wash your hands often with soap and water for 20 seconds. If soap and water   are not available, use an alcohol-based hand sanitizer. ? Avoid touching your mouth, face, eyes, or nose. ? Avoid going out in public, follow guidance from your state and local health authorities. ? If you must go out in public, wear a cloth face covering or face mask. Make sure your mask covers your nose and mouth. ? Avoid crowded indoor spaces. Stay at least 6 feet (2 meters) away from others. ? Disinfect objects and surfaces that are frequently touched every day. This may include:  Counters and tables.  Doorknobs and light switches.  Sinks and faucets.  Electronics, such as phones, remote controls, keyboards, computers, and tablets. To protect others: If you have symptoms of COVID-19, take steps to prevent the virus from spreading to others.  If you think you have a COVID-19 infection, contact  your health care provider right away. Tell your health care team that you think you may have a COVID-19 infection.  Stay home. Leave your house only to seek medical care. Do not use public transport.  Do not travel while you are sick.  Wash your hands often with soap and water for 20 seconds. If soap and water are not available, use alcohol-based hand sanitizer.  Stay away from other members of your household. Let healthy household members care for children and pets, if possible. If you have to care for children or pets, wash your hands often and wear a mask. If possible, stay in your own room, separate from others. Use a different bathroom.  Make sure that all people in your household wash their hands well and often.  Cough or sneeze into a tissue or your sleeve or elbow. Do not cough or sneeze into your hand or into the air.  Wear a cloth face covering or face mask. Make sure your mask covers your nose and mouth. Where to find more information  Centers for Disease Control and Prevention: www.cdc.gov/coronavirus/2019-ncov/index.html  World Health Organization: www.who.int/health-topics/coronavirus Contact a health care provider if:  You live in or have traveled to an area where COVID-19 is a risk and you have symptoms of the infection.  You have had contact with someone who has COVID-19 and you have symptoms of the infection. Get help right away if:  You have trouble breathing.  You have pain or pressure in your chest.  You have confusion.  You have bluish lips and fingernails.  You have difficulty waking from sleep.  You have symptoms that get worse. These symptoms may represent a serious problem that is an emergency. Do not wait to see if the symptoms will go away. Get medical help right away. Call your local emergency services (911 in the [place] Do not drive yourself to the hospital. Let the emergency medical personnel know if you think you have  COVID-19. Summary  COVID-19 is a respiratory infection that is caused by a virus. It is also known as coronavirus disease or novel coronavirus. It can cause serious infections, such as pneumonia, acute respiratory distress syndrome, acute respiratory failure, or sepsis.  The virus that causes COVID-19 is contagious. This means that it can spread from person to person through droplets from breathing, speaking, singing, coughing, or sneezing.  You are more likely to develop a serious illness if you are 50 years of age or older, have a weak immune system, live in a nursing home, or have chronic disease.  There is no medicine to treat COVID-19. Your health care provider will talk with you about ways to treat your symptoms.    Take steps to protect yourself and others from infection. Wash your hands often and disinfect objects and surfaces that are frequently touched every day. Stay away from people who are sick and wear a mask if you are sick. This information is not intended to replace advice given to you by your health care provider. Make sure you discuss any questions you have with your health care provider. Document Revised: 09/21/2019 Document Reviewed: 12/28/2018 Elsevier Patient Education  2020 Elsevier Inc. What types of side effects do monoclonal antibody drugs cause?  Common side effects  In general, the more common side effects caused by monoclonal antibody drugs include: . Allergic reactions, such as hives or itching . Flu-like signs and symptoms, including chills, fatigue, fever, and muscle aches and pains . Nausea, vomiting . Diarrhea . Skin rashes . Low blood pressure   The CDC is recommending patients who receive monoclonal antibody treatments wait at least 90 days before being vaccinated.  Currently, there are no data on the safety and efficacy of mRNA COVID-19 vaccines in persons who received monoclonal antibodies or convalescent plasma as part of COVID-19 treatment. Based  on the estimated half-life of such therapies as well as evidence suggesting that reinfection is uncommon in the 90 days after initial infection, vaccination should be deferred for at least 90 days, as a precautionary measure until additional information becomes available, to avoid interference of the antibody treatment with vaccine-induced immune responses. 

## 2020-08-22 NOTE — Progress Notes (Signed)
  Diagnosis: COVID-19  Physician: Asencion Noble, MD  Procedure: Covid Infusion Clinic Med: casirivimab\imdevimab infusion - Provided patient with casirivimab\imdevimab fact sheet for patients, parents and caregivers prior to infusion.  Complications: No immediate complications noted.  Discharge: Discharged home   Penny [name] 08/22/2020

## 2020-08-23 IMAGING — DX DG ABDOMEN 1V
2 series · 2 of 2 positions shown · non-contrast
Comparison: CT 01/03/2020, x-ray 09/10/2019

CLINICAL DATA: Diarrhea for 3 weeks

EXAM:
ABDOMEN - 1 VIEW

[abdomen kub (1 of 2)]
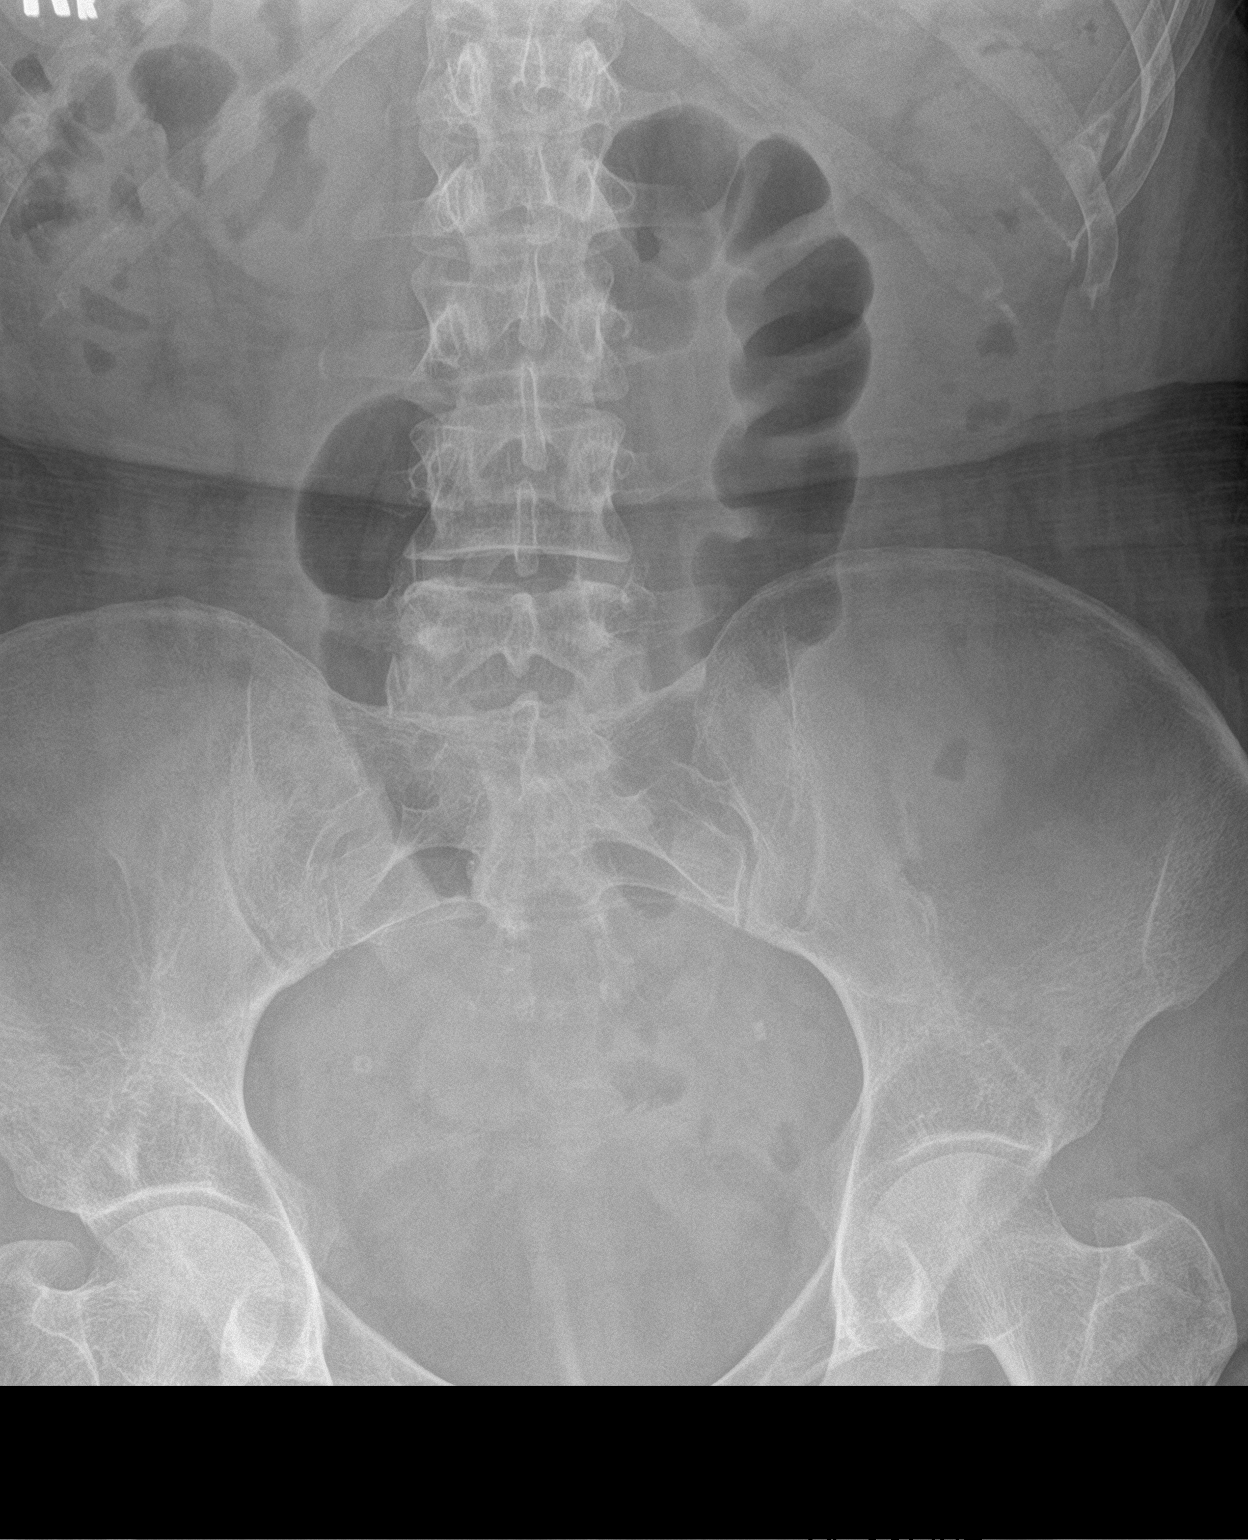

[abdomen kub (2 of 2)]
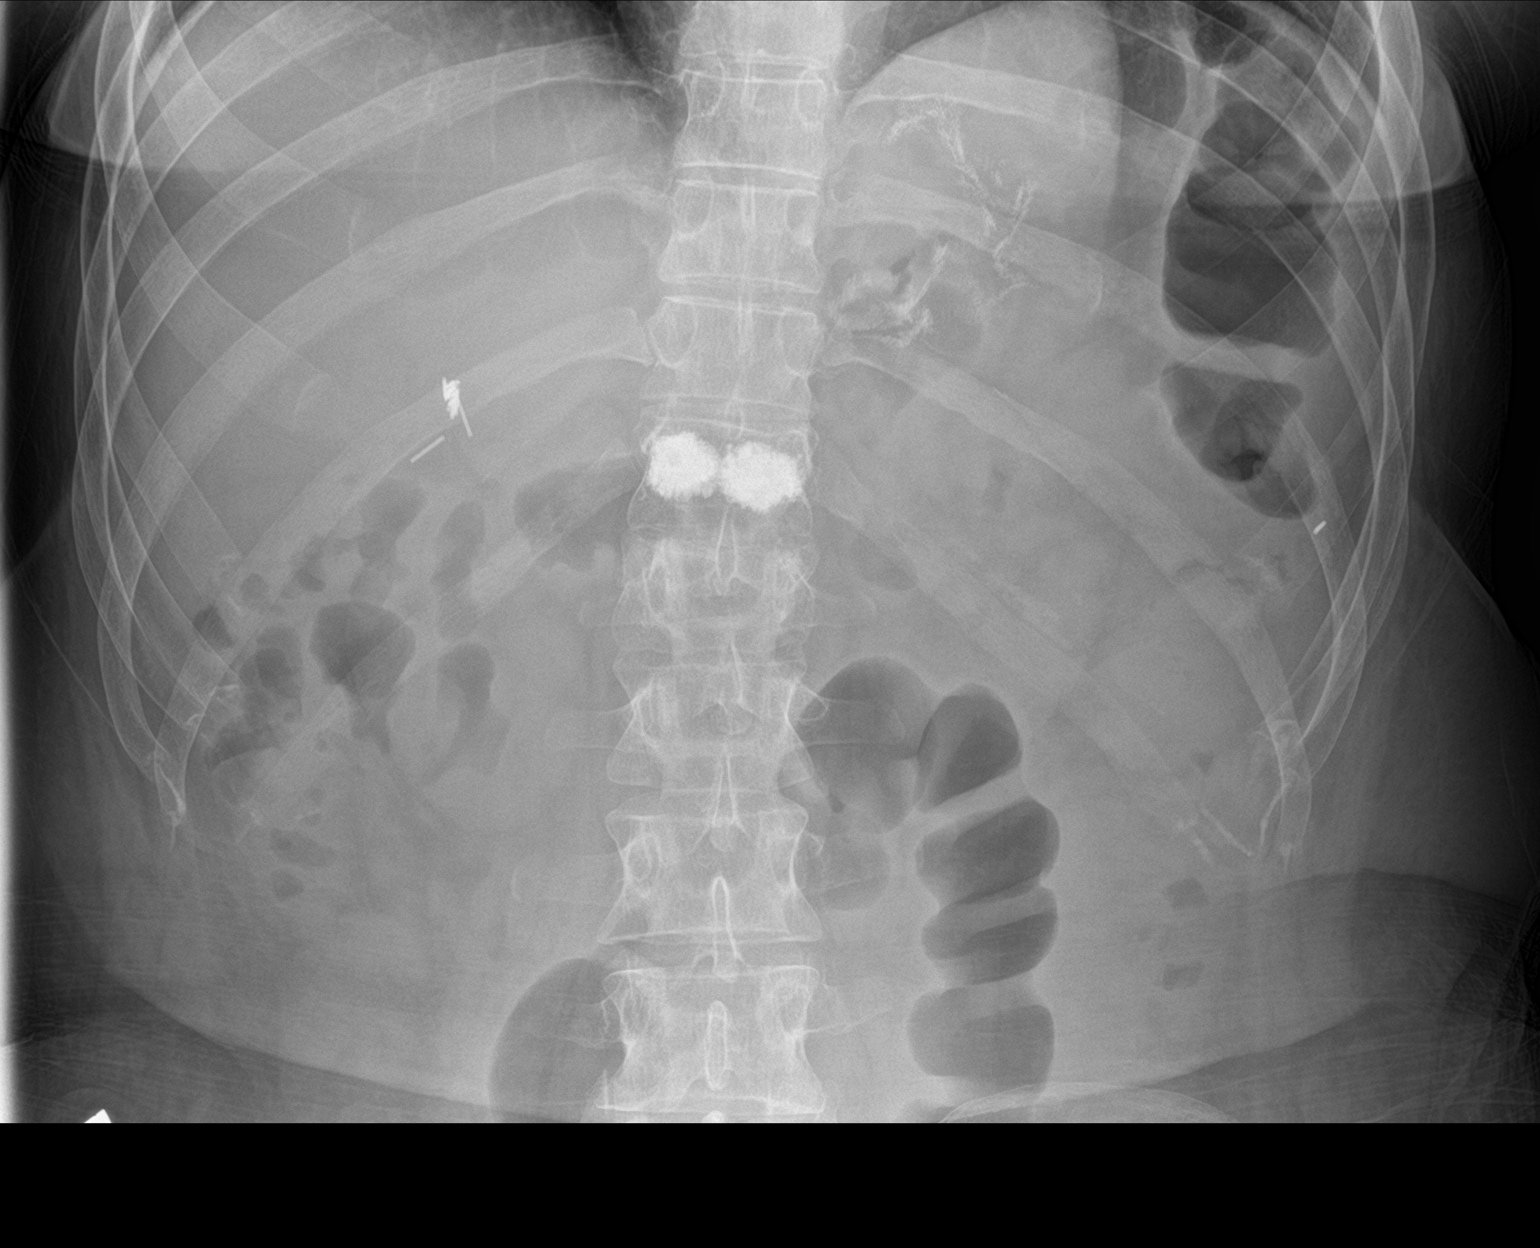

[2 of 2 positions shown; findings below may reference images not displayed]

FINDINGS: The bowel gas pattern is normal. Postsurgical changes at the level
of the proximal stomach. No radio-opaque calculi or other
significant radiographic abnormality are seen. Prior cement
augmentation of T12. Mild compression deformity of L1.
IMPRESSION: Negative.

## 2020-08-25 ENCOUNTER — Telehealth: Payer: Self-pay | Admitting: Cardiology

## 2020-08-25 ENCOUNTER — Ambulatory Visit: Payer: Self-pay

## 2020-08-25 NOTE — Telephone Encounter (Signed)
  Patient Consent for Virtual Visit         Penny [name] has provided verbal consent on 08/25/2020 for a virtual visit (video or telephone).   CONSENT FOR VIRTUAL VISIT FOR:  Penny [name]  By participating in this virtual visit I agree to the following:  I hereby voluntarily request, consent and authorize [facility] and its employed or contracted physicians, physician assistants, nurse practitioners or other licensed health care professionals (the Practitioner), to provide me with telemedicine health care services (the "Services") as deemed necessary by the treating Practitioner. I acknowledge and consent to receive the Services by the Practitioner via telemedicine. I understand that the telemedicine visit will involve communicating with the Practitioner through live audiovisual communication technology and the disclosure of certain medical information by electronic transmission. I acknowledge that I have been given the opportunity to request an in-person assessment or other available alternative prior to the telemedicine visit and am voluntarily participating in the telemedicine visit.  I understand that I have the right to withhold or withdraw my consent to the use of telemedicine in the course of my care at any time, without affecting my right to future care or treatment, and that the Practitioner or I may terminate the telemedicine visit at any time. I understand that I have the right to inspect all information obtained and/or recorded in the course of the telemedicine visit and may receive copies of available information for a reasonable fee.  I understand that some of the potential risks of receiving the Services via telemedicine include:  Marland Kitchen Delay or interruption in medical evaluation due to technological equipment failure or disruption; . Information transmitted may not be sufficient (e.g. poor resolution of images) to allow for appropriate medical decision making by the Practitioner;  and/or  . In rare instances, security protocols could fail, causing a breach of personal health information.  Furthermore, I acknowledge that it is my responsibility to provide information about my medical history, conditions and care that is complete and accurate to the best of my ability. I acknowledge that Practitioner's advice, recommendations, and/or decision may be based on factors not within their control, such as incomplete or inaccurate data provided by me or distortions of diagnostic images or specimens that may result from electronic transmissions. I understand that the practice of medicine is not an exact science and that Practitioner makes no warranties or guarantees regarding treatment outcomes. I acknowledge that a copy of this consent can be made available to me via my patient portal ([facility]), or I can request a printed copy by calling the office of [facility].    I understand that my insurance will be billed for this visit.   I have read or had this consent read to me. . I understand the contents of this consent, which adequately explains the benefits and risks of the Services being provided via telemedicine.  . I have been provided ample opportunity to ask questions regarding this consent and the Services and have had my questions answered to my satisfaction. . I give my informed consent for the services to be provided through the use of telemedicine in my medical care

## 2020-08-26 ENCOUNTER — Other Ambulatory Visit: Payer: Self-pay

## 2020-08-26 ENCOUNTER — Encounter: Payer: Self-pay | Admitting: Cardiology

## 2020-08-26 ENCOUNTER — Telehealth (INDEPENDENT_AMBULATORY_CARE_PROVIDER_SITE_OTHER): Payer: Medicaid Other | Admitting: Cardiology

## 2020-08-26 VITALS — Ht 66.0 in | Wt 175.0 lb

## 2020-08-26 DIAGNOSIS — R079 Chest pain, unspecified: Secondary | ICD-10-CM

## 2020-08-26 DIAGNOSIS — R000 Tachycardia, unspecified: Secondary | ICD-10-CM | POA: Diagnosis not present

## 2020-08-26 DIAGNOSIS — R55 Syncope and collapse: Secondary | ICD-10-CM | POA: Diagnosis not present

## 2020-08-26 DIAGNOSIS — U071 COVID-19: Secondary | ICD-10-CM

## 2020-08-26 NOTE — Patient Instructions (Signed)
Medication Instructions:  Your physician recommends that you continue on your current medications as directed. Please refer to the Current Medication list given to you today.  *If you need a refill on your cardiac medications before your next appointment, please call your pharmacy*   Lab Work: NONE   If you have labs (blood work) drawn today and your tests are completely normal, you will receive your results only by: Marland Kitchen MyChart Message (if you have MyChart) OR . A paper copy in the mail If you have any lab test that is abnormal or we need to change your treatment, we will call you to review the results.   Testing/Procedures:  Your physician has recommended that you wear an event monitor. Event monitors are medical devices that record the heart's electrical activity. Doctors most often [name] these monitors to diagnose arrhythmias. Arrhythmias are problems with the speed or rhythm of the heartbeat. The monitor is a small, portable device. You can wear one while you do your normal daily activities. This is usually used to diagnose what is causing palpitations/syncope (passing out).     Follow-Up: At [facility], you and your health needs are our priority.  As part of our continuing mission to provide you with exceptional heart care, we have created designated Provider Care Teams.  These Care Teams include your primary Cardiologist (physician) and Advanced Practice Providers (APPs -  Physician Assistants and Nurse Practitioners) who all work together to provide you with the care you need, when you need it.  We recommend signing up for the patient portal called "MyChart".  Sign up information is provided on this After Visit Summary.  MyChart is used to connect with patients for Virtual Visits (Telemedicine).  Patients are able to view lab/test results, encounter notes, upcoming appointments, etc.  Non-urgent messages can be sent to your provider as well.   To learn more about what you can do with  MyChart, go to [email or web address].    Your next appointment:   3-4  week(s)  The format for your next appointment:   In Person  Provider:   You may see Jenkins Rouge, MD or one of the following Advanced Practice Providers on your designated Care Team:    Bernerd Pho, PA-C   Ermalinda Barrios, PA-C     Other Instructions Thank you for choosing [facility]!

## 2020-08-26 NOTE — Progress Notes (Signed)
Virtual Visit via Telephone Note   This visit type was conducted due to national recommendations for restrictions regarding the COVID-19 Pandemic (e.g. social distancing) in an effort to limit this patient's exposure and mitigate transmission in our community.  Due to her co-morbid illnesses, this patient is at least at moderate risk for complications without adequate follow up.  This format is felt to be most appropriate for this patient at this time.  The patient did not have access to video technology/had technical difficulties with video requiring transitioning to audio format only (telephone).  All issues noted in this document were discussed and addressed.  No physical exam could be performed with this format.  Please refer to the patient's chart for her  consent to telehealth for [facility].    Date:  08/26/2020   ID:  Penny [name], DOB 07-Feb-1986, MRN [id number] The patient was identified using 2 identifiers.  Patient Location: Home Provider Location: Office/Clinic  PCP:  Kathyrn Drown, MD  Cardiologist:  Jenkins Rouge, MD  Electrophysiologist:  None   Evaluation Performed:  Follow-Up Visit  Chief Complaint:  Racing HR, syncope   History of Present Illness:    Penny [name] is a 34 y.o. female with hx of palpitations with eval.  No racing HR noted then.    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 History of iron deficiency anemia and hematemesis in May EGD 04/27/19 had 2 ulcers in the gastrojejunal anastomosis . Her last Hb 05/18/19 was still low at 9.6. She has had atypical chest pain likely related to her GI problems with negative troponins Pain is constant for hours in chest Not exertional Right and left sided She had an echo done 2015 for syncope that was normal except for possible PFO.     Only one cup coffee / day No other supplements or stimulants. Dr. Johnsie Cancel takes care of her dad Lindi Adie who had CABG in 2012    Hx of normal echo in 2015 hx anemia.  On monitor no  arrhythmias, when pt triggered alarms no arrhythmia rare PAC and PVC <1% total beats and no symptomatic echo 08/2019 with EF 50-55%, no LVH  ETT was recommended but not done.    The patient does have symptoms concerning for COVID-19 infection (fever, chills, cough, or new shortness of breath).  She tested + and has rec'd casirivimab\imdevimab infusion on the 17th.    Today she tells me she is having weakness and racing Heart rate.  She started having these issues in July.  Was seen in ER then and HR 37 with conversation HR up to 51 - BP was stable pt on dilaudid - also had lower back pain with fall the day before and partial L1 fx.  She was wearing a brace.  With that fall she was standing and completely passed out.   She was at home and was lightheaded and had had diarrhea.  She stood up with the lightheadedness got dizzy and fell, passed out. EKG then SB at 54 no acute changes on EKG - no orthostatic BP checks noted since 2020.    She tells me she has SOB though she does have COVID at times she tells me her sp02 is down to 88% - she has not discussed with her PCP   Past Medical History:  Diagnosis Date  . ADD (attention deficit disorder)   . Anemia 2011   2o to GASTRIC BYPASS  . Anginal pain ([place])   . Anxiety   .  Asthma   . Blood transfusion without reported diagnosis   . Chronic daily headache   . Depression   . Depression   . Dysmenorrhea 09/18/2013  . Dysrhythmia   . Elevated liver enzymes JUL 2011ALK PHOS 111-127 AST  143-267 ALT  213-321T BILI 0.6  ALB  3.7-4.06 Jun 2011 ALK PHOS 118 AST 24 ALT 42 T BILI 0.4 ALB 3.9  . Encounter for drug rehabilitation    at behavioral health for opioid addiction about 4 years ago  . Family history of adverse reaction to anesthesia    'dad had to be kept on pump for breathing for morphine'  . Fatty liver   . Fibroid 01/18/2017  . Fibromyalgia   . Gastric bypass status for obesity   . Gastritis JULY 2011  . Heart murmur   . Hereditary and  idiopathic peripheral neuropathy 01/15/2015  . History of Holter monitoring   . Hx of opioid abuse ([place])    for about 4 years, about 4 years ago  . Interstitial cystitis   . Iron deficiency anemia 07/23/2010  . Irritable bowel syndrome 2012 DIARRHEA   JUN 2012 TTG IgA 14.9  . IUD FEB 2010  . Lupus ([place])   . Menorrhagia 07/18/2013  . Migraines   . Obesity (BMI 30-39.9) 2011 228 LBS BMI 36.8  . Ovarian cyst   . Patient desires pregnancy 09/18/2013  . Polyneuropathy   . PONV (postoperative nausea and vomiting) seizure post-operatively   seizure following ankle surgery  . Potassium (K) deficiency   . Pregnant 12/25/2013  . Psychiatric pseudoseizure   . RLQ abdominal pain 07/18/2013  . Sciatica of left side 11/14/2014  . 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 Stress 09/18/2013   Past Surgical History:  Procedure Laterality Date  . BIOPSY  04/10/2015   Procedure: BIOPSY;  Surgeon: Daneil Dolin, MD;  Location: [facility];  Service: Endoscopy;;  . cath self every nite     for sodium bicarb injection (discontinued 2013)  . CHOLECYSTECTOMY  2005   biliary dyskinesia  . COLONOSCOPY  JUN 2012 ABD PN/DIARRHEA WITH PROPOFOL   NL COLON  . DILATION AND CURETTAGE OF UTERUS    . DILITATION & CURRETTAGE/HYSTROSCOPY WITH NOVASURE ABLATION N/A 03/24/2017   Procedure: DILATATION & CURETTAGE/HYSTEROSCOPY WITH NOVASURE ENDOMETRIAL ABLATION;  Surgeon: Jonnie Kind, MD;  Location: [facility];  Service: Gynecology;  Laterality: N/A;  . ESOPHAGEAL DILATION N/A 04/10/2015   Procedure: ESOPHAGEAL DILATION WITH 54FR MALONEY DILATOR;  Surgeon: Daneil Dolin, MD;  Location: [facility];  Service: Endoscopy;  Laterality: N/A;  . ESOPHAGOGASTRODUODENOSCOPY    . ESOPHAGOGASTRODUODENOSCOPY (EGD) WITH PROPOFOL N/A 04/10/2015   Procedure: ESOPHAGOGASTRODUODENOSCOPY (EGD) WITH PROPOFOL;  Surgeon: Daneil Dolin, MD;  Location: [facility];  Service: Endoscopy;  Laterality: N/A;  . ESOPHAGOGASTRODUODENOSCOPY (EGD) WITH PROPOFOL  N/A 12/06/2016   Procedure: ESOPHAGOGASTRODUODENOSCOPY (EGD) WITH PROPOFOL;  Surgeon: Danie Binder, MD;  [facility];  Service: Endoscopy;  Laterality: N/A;  . ESOPHAGOGASTRODUODENOSCOPY (EGD) WITH PROPOFOL N/A 04/27/2019   Procedure: ESOPHAGOGASTRODUODENOSCOPY (EGD) WITH PROPOFOL;  Surgeon: Rogene Houston, MD;  [facility];  Service: Endoscopy;  Laterality: N/A;  730  . ESOPHAGOGASTRODUODENOSCOPY (EGD) WITH PROPOFOL N/A 08/31/2019   Procedure: ESOPHAGOGASTRODUODENOSCOPY (EGD) WITH PROPOFOL;  Surgeon: Rogene Houston, MD;  [facility];  Service: Endoscopy;  Laterality: N/A;  . GAB  2007   in [place]-POUCH 5 CM  . GASTRIC BYPASS  06/2006  .  HYSTEROSCOPY WITH D & C N/A 09/12/2014   Procedure: DILATATION AND CURETTAGE /HYSTEROSCOPY;  Surgeon: Jonnie Kind, MD;  Location: [facility];  Service: Gynecology;  Laterality: N/A;  . KYPHOPLASTY N/A 08/02/2018   Procedure: KYPHOPLASTY T12;  Surgeon: Melina Schools, MD;  Location: [facility];  Service: Orthopedics;  Laterality: N/A;  60 mins  . LAPAROSCOPIC TUBAL LIGATION Bilateral 03/24/2017   Procedure: LAPAROSCOPIC TUBAL LIGATION ([place] Rings);  Surgeon: Jonnie Kind, MD;  Location: [facility];  Service: Gynecology;  Laterality: Bilateral;  . REPAIR VAGINAL CUFF N/A 07/30/2014   Procedure: REPAIR VAGINAL CUFF;  Surgeon: Mora Bellman, MD;  Location: [facility];  Service: Gynecology;  Laterality: N/A;  . SAVORY DILATION  06/20/2012   Dr. Barnie Alderman gastritis/Ulcer in the mid jejunum. Empiric dilation.   . SURAL NERVE BX Left 02/25/2016   Procedure: LEFT SURAL NERVE BIOPSY;  Surgeon: Jovita Gamma, MD;  Location: [facility];  Service: Neurosurgery;  Laterality: Left;  Left sural nerve biopsy  . TONSILLECTOMY    . TONSILLECTOMY AND ADENOIDECTOMY    . UPPER GASTROINTESTINAL ENDOSCOPY  JULY 2011 NAUSEA-D125,V6, PH 25   Bx; GASTRITIS, POUCH-5 CM LONG  . WISDOM TOOTH EXTRACTION       Current Meds  Medication Sig  . ADDERALL  XR 30 MG 24 hr capsule SMARTSIG:1 Capsule(s) By Mouth  . AIMOVIG 70 MG/ML SOAJ SMARTSIG:70 Milligram(s) SUB-Q Every 4 Weeks  . budesonide-formoterol (SYMBICORT) 160-4.5 MCG/ACT inhaler Inhale 2 puffs into the lungs 2 (two) times daily.  . busPIRone (BUSPAR) 5 MG tablet Take 1 tablet (5 mg total) by mouth 3 (three) times daily.  . cetirizine (ZYRTEC) 10 MG tablet Take 10 mg by mouth daily.  . clonazePAM (KLONOPIN) 0.5 MG tablet Take 1 tablet (0.5 mg total) by mouth 2 (two) times daily.  . DULoxetine (CYMBALTA) 60 MG capsule Take 1 capsule (60 mg total) by mouth 2 (two) times daily.  Marland Kitchen HYDROmorphone (DILAUDID) 2 MG tablet Take 2 tablets (4 mg total) by mouth every 12 (twelve) hours as needed for severe pain. Take 1-2 tablets po q 4 hrs prn pain no greater than 9 tablets/day  . ondansetron (ZOFRAN-ODT) 8 MG disintegrating tablet Take 8 mg by mouth every 8 (eight) hours as needed.  . pantoprazole (PROTONIX) 40 MG tablet TAKE 1 TABLET BY MOUTH TWICE DAILY BEFORE MEALS. (Patient taking differently: Take 40 mg by mouth 2 (two) times daily. )  . promethazine (PHENERGAN) 25 MG tablet TAKE (1) TABLET BY MOUTH TWICE A DAY AS NEEDED. (Patient taking differently: Take 25 mg by mouth 2 (two) times daily as needed. TAKE (1) TABLET BY MOUTH TWICE A DAY AS NEEDED.)  . traZODone (DESYREL) 150 MG tablet Take 1 tablet (150 mg total) by mouth at bedtime.  . valACYclovir (VALTREX) 1000 MG tablet TAKE 1 TABLET BY MOUTH DAILY.     Allergies:   Gabapentin, Metoclopramide hcl, Other, Tramadol, Ativan [lorazepam], Latex, Lyrica [pregabalin], Trazodone and nefazodone, Zofran [ondansetron], Iron, Sulfa antibiotics, Butrans [buprenorphine], Nucynta [tapentadol], Propofol, Tape, and Toradol [ketorolac tromethamine]   Social History   Tobacco Use  . Smoking status: Former Smoker    Packs/day: 0.25    Years: 6.00    Pack years: 1.50    Types: Cigarettes    Start date: 05/2018  . Smokeless tobacco: Never Used  Vaping Use    . Vaping Use: Former  Substance Use Topics  . Alcohol use: Not Currently    Alcohol/week: 0.0 standard drinks  . Drug use: Not  Currently     Family Hx: The patient's family history includes Anxiety disorder in her brother and mother; Bipolar disorder in her brother; Breast cancer in her maternal grandmother, mother, and paternal grandmother; Cancer in her maternal grandmother and mother; Coronary artery disease in her father and paternal grandfather; Depression in her mother; Diabetes in her father; Healthy in her daughter and son; Hemochromatosis in her maternal grandmother and mother; Hypertension in her father; Migraines in her maternal grandmother and paternal grandmother.  ROS:   Please see the history of present illness.    General:+ COVID, no weight changes Skin:no rashes or ulcers HEENT:no blurred vision, no congestion CV:see HPI PUL:see HPI GI:no diarrhea constipation or melena, no indigestion GU:no hematuria, no dysuria MS:no joint pain, no claudication Neuro:no syncope, since July no lightheadedness Endo:no diabetes, no thyroid disease  All other systems reviewed and are negative.   Prior CV studies:   The following studies were reviewed today:  Echo 08/29/19  IMPRESSIONS    1. Left ventricular ejection fraction, by visual estimation, is 50 to  55%. The left ventricle has normal function. Normal left ventricular size.  There is no left ventricular hypertrophy.  2. Global right ventricle has normal systolic function.The right  ventricular size is normal. No increase in right ventricular wall  thickness.  3. Left atrial size was normal.  4. Right atrial size was normal.  5. The mitral valve is grossly normal with mild thickening. No evidence  of mitral valve regurgitation.  6. The tricuspid valve is grossly normal. Tricuspid valve regurgitation  is trivial.  7. The aortic valve is tricuspid Aortic valve regurgitation was not  visualized by color flow  Doppler.  8. The pulmonic valve was not well visualized. Pulmonic valve  regurgitation was not assessed by color flow Doppler.  9. TR signal is inadequate for assessing pulmonary artery systolic  pressure.  10. The inferior vena cava is normal in size with greater than 50%  respiratory variability, suggesting right atrial pressure of 3 mmHg.  11. Patient reported to have history of ostium secundum ASD, but this is  not clearly evident by the present study.   In comparison to the previous echocardiogram(s): Unable to compare with  prior study.  FINDINGS  Left Ventricle: Left ventricular ejection fraction, by visual estimation,  is 50 to 55%. The left ventricle has normal function. There is no left  ventricular hypertrophy. Normal left ventricular size. Spectral Doppler  shows Left ventricular diastolic  parameters were normal pattern of LV diastolic filling.   Right Ventricle: The right ventricular size is normal. No increase in  right ventricular wall thickness. Global RV systolic function is has  normal systolic function.   Left Atrium: Left atrial size was normal in size.   Right Atrium: Right atrial size was normal in size   Pericardium: There is no evidence of pericardial effusion.   Labs/Other Tests and Data Reviewed:    EKG:  An ECG dated 06/26/20 was personally reviewed today and demonstrated:  SB at 34  no ST changes  Recent Labs: 09/19/2019: Magnesium 2.2 06/23/2020: ALT 39 08/20/2020: BUN 9; Creatinine, Ser 0.65; Hemoglobin 13.6; Platelets 331; Potassium 3.0; Sodium 136   Recent Lipid Panel Lab Results  Component Value Date/Time   CHOL 95 03/10/2012 06:42 AM   TRIG 50 03/10/2012 06:42 AM   HDL 38 (L) 03/10/2012 06:42 AM   CHOLHDL 2.5 03/10/2012 06:42 AM   LDLCALC 47 03/10/2012 06:42 AM    Wt Readings from Last  3 Encounters:  08/26/20 175 lb (79.4 kg)  08/20/20 170 lb (77.1 kg)  07/03/20 177 lb 12.8 oz (80.6 kg)     Objective:    Vital Signs:  Ht _0   (1.676 m)   Wt 175 lb (79.4 kg) Comment: per pt  BMI 28.25 kg/m    VITAL SIGNS:  reviewed  NO BP taken.  She tells me BP runs from 97 systolic to 972.   General weak voice, had to ask to repeat herself at times.   pulmonary could speak in complete sentences but sounded winded.  ASSESSMENT & PLAN:    1. Syncope in July, HR in 34s and recently she feels racing HR, in past her racing HR was SR.  But with several issues will do 14 day event monitor in a week when no longer contagious.  She should have office visit in 3-4 weeks for results. At that time orthostatic BP should be checked.  Not sure if brady was related to pain meds at that time.   2. COVID 19 and has casirivimab\imdevimab infusion on the 17th. Improving though desats at times. 3.  acute compression fracture of L1, 06/25/20 with syncope resulting in fall on her back.  4. Chest pain with neg CTA of chest for PE and no noted CAD if pain continues after COVID improved could do ETT if back pain allows or lexiscan myoivew 5. Anxiety/bipolar disorder per PCP  COVID-19 Education: The signs and symptoms of COVID-19 were discussed with the patient and how to seek care for testing (follow up with PCP or arrange E-visit).  The importance of social distancing was discussed today.  Time:   Today, I have spent 20 minutes with the patient with telehealth technology discussing the above problems.     Medication Adjustments/Labs and Tests Ordered: Current medicines are reviewed at length with the patient today.  Concerns regarding medicines are outlined above.   Tests Ordered: Orders Placed This Encounter  Procedures  . LONG TERM MONITOR-LIVE TELEMETRY (3-14 DAYS)    Medication Changes: No orders of the defined types were placed in this encounter.   Follow Up:  In Person in 4 week(s)  Signed, Cecilie Kicks, NP  08/26/2020 5:24 PM    [facility]

## 2020-08-29 DIAGNOSIS — M5442 Lumbago with sciatica, left side: Secondary | ICD-10-CM | POA: Diagnosis not present

## 2020-08-29 DIAGNOSIS — M5441 Lumbago with sciatica, right side: Secondary | ICD-10-CM | POA: Diagnosis not present

## 2020-08-29 DIAGNOSIS — Z9189 Other specified personal risk factors, not elsewhere classified: Secondary | ICD-10-CM | POA: Diagnosis not present

## 2020-08-29 DIAGNOSIS — G8929 Other chronic pain: Secondary | ICD-10-CM | POA: Diagnosis not present

## 2020-09-02 ENCOUNTER — Other Ambulatory Visit: Payer: Self-pay | Admitting: Family Medicine

## 2020-09-03 ENCOUNTER — Other Ambulatory Visit (HOSPITAL_COMMUNITY): Payer: Self-pay | Admitting: Psychiatry

## 2020-09-03 ENCOUNTER — Telehealth: Payer: Self-pay | Admitting: *Deleted

## 2020-09-03 ENCOUNTER — Telehealth (INDEPENDENT_AMBULATORY_CARE_PROVIDER_SITE_OTHER): Payer: Medicaid Other | Admitting: Family Medicine

## 2020-09-03 ENCOUNTER — Other Ambulatory Visit: Payer: Self-pay

## 2020-09-03 DIAGNOSIS — R634 Abnormal weight loss: Secondary | ICD-10-CM

## 2020-09-03 DIAGNOSIS — S0093XD Contusion of unspecified part of head, subsequent encounter: Secondary | ICD-10-CM

## 2020-09-03 DIAGNOSIS — M8000XA Age-related osteoporosis with current pathological fracture, unspecified site, initial encounter for fracture: Secondary | ICD-10-CM

## 2020-09-03 DIAGNOSIS — Z79899 Other long term (current) drug therapy: Secondary | ICD-10-CM

## 2020-09-03 DIAGNOSIS — R42 Dizziness and giddiness: Secondary | ICD-10-CM | POA: Diagnosis not present

## 2020-09-03 DIAGNOSIS — M2550 Pain in unspecified joint: Secondary | ICD-10-CM | POA: Diagnosis not present

## 2020-09-03 DIAGNOSIS — Z8669 Personal history of other diseases of the nervous system and sense organs: Secondary | ICD-10-CM | POA: Diagnosis not present

## 2020-09-03 NOTE — Progress Notes (Signed)
   Subjective:    Patient ID: Penny [name], female    DOB: 09-07-1986, 34 y.o.   MRN: [id number]  HPI  Virtual Visit via Video Note  I connected with [name] on 09/03/20 at 11:00 AM EDT by a video enabled telemedicine application and verified that I am speaking with the correct person using two identifiers.  Location: Patient: home Provider: office   I discussed the limitations of evaluation and management by telemedicine and the availability of in person appointments. The patient expressed understanding and agreed to proceed.  History of Present Illness:    Observations/Objective:   Assessment and Plan:   Follow Up Instructions:    I discussed the assessment and treatment plan with the patient. The patient was provided an opportunity to ask questions and all were answered. The patient agreed with the plan and demonstrated an understanding of the instructions.   The patient was advised to call back or seek an in-person evaluation if the symptoms worsen or if the condition fails to improve as anticipated.  I provided 30 with chart review and documentation minutes of non-face-to-face time during this encounter.  Patient calling in today to discuss Bone density results and treatment plan.  Bone density done 07/08/20 at [facility].   Review of Systems  Constitutional: Positive for fatigue and unexpected weight change. Negative for activity change and appetite change.  HENT: Negative for congestion and rhinorrhea.   Respiratory: Negative for cough and shortness of breath.   Cardiovascular: Negative for chest pain and leg swelling.  Gastrointestinal: Negative for abdominal pain and diarrhea.  Endocrine: Negative for polydipsia and polyphagia.  Skin: Negative for color change.  Neurological: Positive for dizziness and headaches. Negative for weakness.  Psychiatric/Behavioral: Negative for behavioral problems and confusion.       Objective:   Physical Exam Patient had  virtual visit-video Appears to be in no distress Atraumatic Neuro able to relate and oriented No apparent resp distress Color normal        Assessment & Plan:  1. Osteoporosis with current pathological fracture, unspecified osteoporosis type, initial encounter Severe osteoporosis.  Very concerning.  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  Also vitamin D 800 IUs daily.  2. Arthralgia, unspecified joint Significant arthralgias she is concerned about rheumatoid arthritis we will run some blood tests await results  3. History of migraine headaches History of migraines but current headache I believe is due to head contusion will do CT see below  4. Contusion of head, unspecified part of head, subsequent encounter Patient relates when she broke her back she also hit her head and has had severe headaches ever since with dizziness lightheaded therefore we will do CT scan to rule out subdural  5. Lightheaded See above  6. 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

## 2020-09-03 NOTE — Addendum Note (Signed)
Addended by: Patsy Lager on: 09/03/2020 04:29 PM   Modules accepted: Orders

## 2020-09-03 NOTE — Addendum Note (Signed)
Addended by: Patsy Lager on: 09/03/2020 04:02 PM   Modules accepted: Orders

## 2020-09-03 NOTE — Telephone Encounter (Signed)
Ms. aspasia, rude are scheduled for a virtual visit with your provider today.    Just as we do with appointments in the office, we must obtain your consent to participate.  Your consent will be active for this visit and any virtual visit you may have with one of our providers in the next 365 days.    If you have a MyChart account, I can also send a copy of this consent to you electronically.  All virtual visits are billed to your insurance company just like a traditional visit in the office.  As this is a virtual visit, video technology does not allow for your provider to perform a traditional examination.  This may limit your provider's ability to fully assess your condition.  If your provider identifies any concerns that need to be evaluated in person or the need to arrange testing such as labs, EKG, etc, we will make arrangements to do so.    Although advances in technology are sophisticated, we cannot ensure that it will always work on either your end or our end.  If the connection with a video visit is poor, we may have to switch to a telephone visit.  With either a video or telephone visit, we are not always able to ensure that we have a secure connection.   I need to obtain your verbal consent now.   Are you willing to proceed with your visit today?   Neka Cella has provided verbal consent on 09/03/2020 for a virtual visit (video or telephone).   Patsy Lager, LPN 5/45/6256  38:93 AM

## 2020-09-03 NOTE — Telephone Encounter (Signed)
Call for appt next month

## 2020-09-05 ENCOUNTER — Ambulatory Visit (HOSPITAL_COMMUNITY)
Admission: RE | Admit: 2020-09-05 | Discharge: 2020-09-05 | Disposition: A | Discharge status: Home / Self Care | Payer: Medicaid Other | Source: Ambulatory Visit | Attending: Family Medicine | Admitting: Family Medicine

## 2020-09-05 ENCOUNTER — Encounter: Payer: Self-pay | Admitting: Family Medicine

## 2020-09-05 ENCOUNTER — Other Ambulatory Visit: Payer: Self-pay

## 2020-09-05 DIAGNOSIS — S0093XD Contusion of unspecified part of head, subsequent encounter: Secondary | ICD-10-CM | POA: Diagnosis not present

## 2020-09-05 DIAGNOSIS — R42 Dizziness and giddiness: Secondary | ICD-10-CM | POA: Diagnosis not present

## 2020-09-05 DIAGNOSIS — Z8669 Personal history of other diseases of the nervous system and sense organs: Secondary | ICD-10-CM | POA: Insufficient documentation

## 2020-09-05 DIAGNOSIS — R519 Headache, unspecified: Secondary | ICD-10-CM | POA: Diagnosis not present

## 2020-09-10 ENCOUNTER — Telehealth: Payer: Self-pay

## 2020-09-10 NOTE — Telephone Encounter (Signed)
Patient is wanting to know if she should take Covid shot and switch one. Also is is still have severe back pain from breaking her back the second time in July 2021. I ask if she contacted her surgeon and she stated that her insurance doesnt take care of follow up visit due to its worksmans comp she is wanting some direction of what to do now due for the pain she is in. I told her we dont handle worksman comp per [employer]. She states the second break is worst than the first with pain. Please advise

## 2020-09-10 NOTE — Telephone Encounter (Signed)
Patient states workers comp is no longer handling it and her concern is that her back pain seems to be getting progressively worse an debilitating with each scan she has and that scares her. Patient is still seeing pain management but wants to know what to do to keep it from continuing to get worse

## 2020-09-10 NOTE — Telephone Encounter (Signed)
She will need to track down the Workmen's Comp. carrier and get direction from them  As for vaccine I recommend Moderna I believe this is the most effective [employer] can do this she can schedule online

## 2020-09-11 ENCOUNTER — Other Ambulatory Visit (HOSPITAL_COMMUNITY): Payer: Self-pay | Admitting: Psychiatry

## 2020-09-11 NOTE — Telephone Encounter (Signed)
Pt contacted and verbalized understanding. Pt transferred up front to set up appt.

## 2020-09-11 NOTE — Telephone Encounter (Signed)
So at some point time the patient should set up a follow-up office visit with myself or with Santiago Glad we can evaluate her back and refer her to the appropriate orthopedic back specialist

## 2020-09-22 ENCOUNTER — Ambulatory Visit: Payer: Medicaid Other | Admitting: Family Medicine

## 2020-09-23 ENCOUNTER — Other Ambulatory Visit (HOSPITAL_COMMUNITY): Payer: Self-pay | Admitting: Psychiatry

## 2020-09-25 ENCOUNTER — Other Ambulatory Visit: Payer: Self-pay

## 2020-09-25 ENCOUNTER — Other Ambulatory Visit (INDEPENDENT_AMBULATORY_CARE_PROVIDER_SITE_OTHER): Payer: Medicaid Other

## 2020-09-25 DIAGNOSIS — R000 Tachycardia, unspecified: Secondary | ICD-10-CM

## 2020-09-25 NOTE — Addendum Note (Signed)
Addended by: Levonne Hubert on: 09/25/2020 10:13 AM   Modules accepted: Orders

## 2020-09-27 DIAGNOSIS — M810 Age-related osteoporosis without current pathological fracture: Secondary | ICD-10-CM | POA: Diagnosis not present

## 2020-09-27 DIAGNOSIS — M546 Pain in thoracic spine: Secondary | ICD-10-CM | POA: Diagnosis not present

## 2020-09-27 DIAGNOSIS — R000 Tachycardia, unspecified: Secondary | ICD-10-CM | POA: Diagnosis not present

## 2020-10-02 ENCOUNTER — Other Ambulatory Visit (HOSPITAL_COMMUNITY): Payer: Self-pay | Admitting: Psychiatry

## 2020-10-06 ENCOUNTER — Encounter (HOSPITAL_COMMUNITY): Payer: Self-pay | Admitting: Psychiatry

## 2020-10-06 ENCOUNTER — Telehealth (INDEPENDENT_AMBULATORY_CARE_PROVIDER_SITE_OTHER): Payer: Medicaid Other | Admitting: Psychiatry

## 2020-10-06 ENCOUNTER — Other Ambulatory Visit: Payer: Self-pay

## 2020-10-06 DIAGNOSIS — F411 Generalized anxiety disorder: Secondary | ICD-10-CM

## 2020-10-06 DIAGNOSIS — F900 Attention-deficit hyperactivity disorder, predominantly inattentive type: Secondary | ICD-10-CM | POA: Diagnosis not present

## 2020-10-06 DIAGNOSIS — F332 Major depressive disorder, recurrent severe without psychotic features: Secondary | ICD-10-CM | POA: Diagnosis not present

## 2020-10-06 MED ORDER — DULOXETINE HCL 60 MG PO CPEP: 60.0000 mg | ORAL_CAPSULE | Freq: Two times a day (BID) | ORAL | 2 refills | Status: DC

## 2020-10-06 MED ORDER — BUSPIRONE HCL 10 MG PO TABS: 10.0000 mg | ORAL_TABLET | Freq: Three times a day (TID) | ORAL | 2 refills | Status: DC

## 2020-10-06 MED ORDER — ZOLPIDEM TARTRATE 10 MG PO TABS: 10.0000 mg | ORAL_TABLET | Freq: Every day | ORAL | 2 refills | Status: DC

## 2020-10-06 MED ORDER — CLONAZEPAM 0.5 MG PO TABS: 0.5000 mg | ORAL_TABLET | Freq: Two times a day (BID) | ORAL | 2 refills | Status: DC

## 2020-10-06 MED ORDER — AMPHETAMINE-DEXTROAMPHET ER 30 MG PO CP24
30.0000 mg | ORAL_CAPSULE | Freq: Every day | ORAL | 0 refills | Status: DC
Start: 2020-10-06 — End: 2020-10-07

## 2020-10-06 NOTE — Progress Notes (Signed)
Virtual Visit via Video Note  I connected with Rosibel Acevedo on 10/06/20 at 11:00 AM EDT by a video enabled telemedicine application and verified that I am speaking with the correct person using two identifiers.  Location: Patient: home Provider: home   I discussed the limitations of evaluation and management by telemedicine and the availability of in person appointments. The patient expressed understanding and agreed to proceed.     I discussed the assessment and treatment plan with the patient. The patient was provided an opportunity to ask questions and all were answered. The patient agreed with the plan and demonstrated an understanding of the instructions.   The patient was advised to call back or seek an in-person evaluation if the symptoms worsen or if the condition fails to improve as anticipated.  I provided 15 minutes of non-face-to-face time during this encounter.    , MD  BH MD/PA/NP OP Progress Note  10/06/2020 11:30 AM Crystalle Chavero  MRN:  [id number]  Chief Complaint:  Chief Complaint    Anxiety; Depression; ADHD; Follow-up     HPI: This patient is a 34-year-old female who is living with her boyfriend her daughter and son as well as her boyfriend's 2 daughters in [place].  She is currently not working and is applying for disability.  The patient returns for follow-up today after 3 weeks.  I last saw her on 06/16/2020 and she was admitted to the behavioral health hospital 3 days later.  She had told him that she felt like she was at the end of her rope and her mother told the people in the ER that she could no longer function and was not not getting out of bed or eating.  She states that she had severe diarrhea and could not function.  While in the hospital she seemed to be doing better with all the support and groups.  Her medications were not considerably changed her.  Her Depakote level was 66 and so was continued as before.  The Adderall was  discontinued.  Since getting home from the hospital the patient has been back in the emergency room after she had a new spinal fracture and also developed bradycardia.  She is now on bedrest.  She states that the conflict with her daughter's father is is getting somewhat better.  However now he does not want the daughter who is 6 years old to stay with the patient because he is worried about her physical and mental health.  Right now the patient states she is feeling somewhat better and denies thoughts of self-harm or or suicidal ideation.  She denies severe anxiety or panic attacks.  She is getting along better with her fianc and the children are getting on along better with each other by her report.  She asked me about the Topamax and I explained that I would rather not keep prescribing this because we have little use more than psychiatry and this needs to be taken over by her primary care provider.  Patient returns for follow-up after about 3 months.  Since I last saw her she developed coronavirus is Dr. Entire family.  Also she and her fianc have decided to live separately but to continue to date.  She is currently staying with her mother temporarily until she can find an apartment.  She states that their children did not get along.  The patient states that she has been more depressed has not been sleeping well has had significant anxiety and is "terrified of   being alone."  She denies being suicidal but does feel like she does not want to do anything when her kids are not there.  She has never consistent with a therapist and I have urged her to do this because there is only so much we can do with medication.  I will increase her BuSpar to help the anxiety and change to Ambien at bedtime for sleep.  We cannot go up any further on the Xanax.  She is now going to [facility] and is on both Belbuca and Dilaudid.  She is about to see a surgeon regarding her back.  There is so many factors at work  here and we have gone through so many medications and I really want her to work on therapy. Visit Diagnosis:    ICD-10-CM   1. GAD (generalized anxiety disorder)  F41.1   2. Severe episode of recurrent major depressive disorder, without psychotic features (HCC)  F33.2   3. Attention deficit hyperactivity disorder (ADHD), predominantly inattentive type  F90.0     Past Psychiatric History: Hospitalized 4 months ago for depression  Past Medical History:  Past Medical History:  Diagnosis Date  . ADD (attention deficit disorder)   . Anemia 2011   2o to GASTRIC BYPASS  . Anginal pain (HCC)   . Anxiety   . Asthma   . Blood transfusion without reported diagnosis   . Chronic daily headache   . Depression   . Depression   . Dysmenorrhea 09/18/2013  . Dysrhythmia   . Elevated liver enzymes JUL 2011ALK PHOS 111-127 AST  143-267 ALT  213-321T BILI 0.6  ALB  3.7-4.06 Jun 2011 ALK PHOS 118 AST 24 ALT 42 T BILI 0.4 ALB 3.9  . Encounter for drug rehabilitation    at behavioral health for opioid addiction about 4 years ago  . Family history of adverse reaction to anesthesia    'dad had to be kept on pump for breathing for morphine'  . Fatty liver   . Fibroid 01/18/2017  . Fibromyalgia   . Gastric bypass status for obesity   . Gastritis JULY 2011  . Heart murmur   . 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 History of Holter monitoring   . Hx of opioid abuse (HCC)    for about 4 years, about 4 years ago  . Interstitial cystitis   . Iron deficiency anemia 07/23/2010  . Irritable bowel syndrome 2012 DIARRHEA   JUN 2012 TTG IgA 14.9  . IUD FEB 2010  . Lupus (HCC)   . Menorrhagia 07/18/2013  . Migraines   . Obesity (BMI 30-39.9) 2011 228 LBS BMI 36.8  . Ovarian cyst   . Patient desires pregnancy 09/18/2013  . Polyneuropathy   . PONV (postoperative nausea and vomiting) seizure post-operatively   seizure following ankle surgery  . Potassium (K) deficiency   . Pregnant  12/25/2013  . Psychiatric pseudoseizure   . RLQ abdominal pain 07/18/2013  . Sciatica of left side 11/14/2014  . 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 Stress 09/18/2013    Past Surgical History:  Procedure Laterality Date  . BIOPSY  04/10/2015   Procedure: BIOPSY;  Surgeon: Robert M Rourk, MD;  Location: [facility];  Service: Endoscopy;;  . cath self every nite     for sodium bicarb injection (discontinued 2013)  . CHOLECYSTECTOMY  2005   biliary dyskinesia  . COLONOSCOPY  JUN 2012 ABD PN/DIARRHEA WITH PROPOFOL   NL   COLON  . DILATION AND CURETTAGE OF UTERUS    . DILITATION & CURRETTAGE/HYSTROSCOPY WITH NOVASURE ABLATION N/A 03/24/2017   Procedure: DILATATION & CURETTAGE/HYSTEROSCOPY WITH NOVASURE ENDOMETRIAL ABLATION;  Surgeon: John Ferguson V, MD;  Location: [facility];  Service: Gynecology;  Laterality: N/A;  . ESOPHAGEAL DILATION N/A 04/10/2015   Procedure: ESOPHAGEAL DILATION WITH 54FR MALONEY DILATOR;  Surgeon: Robert M Rourk, MD;  Location: [facility];  Service: Endoscopy;  Laterality: N/A;  . ESOPHAGOGASTRODUODENOSCOPY    . ESOPHAGOGASTRODUODENOSCOPY (EGD) WITH PROPOFOL N/A 04/10/2015   Procedure: ESOPHAGOGASTRODUODENOSCOPY (EGD) WITH PROPOFOL;  Surgeon: Robert M Rourk, MD;  Location: [facility];  Service: Endoscopy;  Laterality: N/A;  . ESOPHAGOGASTRODUODENOSCOPY (EGD) WITH PROPOFOL N/A 12/06/2016   Procedure: ESOPHAGOGASTRODUODENOSCOPY (EGD) WITH PROPOFOL;  Surgeon: Sandi L Fields, MD;  [facility];  Service: Endoscopy;  Laterality: N/A;  . ESOPHAGOGASTRODUODENOSCOPY (EGD) WITH PROPOFOL N/A 04/27/2019   Procedure: ESOPHAGOGASTRODUODENOSCOPY (EGD) WITH PROPOFOL;  Surgeon: Rehman, Najeeb U, MD;  [facility];  Service: Endoscopy;  Laterality: N/A;  730  . ESOPHAGOGASTRODUODENOSCOPY (EGD) WITH PROPOFOL N/A 08/31/2019   Procedure: ESOPHAGOGASTRODUODENOSCOPY (EGD) WITH PROPOFOL;  Surgeon: Rehman, Najeeb U, MD;  [facility];  Service: Endoscopy;  Laterality: N/A;  . GAB   2007   in [place]-POUCH 5 CM  . GASTRIC BYPASS  06/2006  . HYSTEROSCOPY WITH D & C N/A 09/12/2014   Procedure: DILATATION AND CURETTAGE /HYSTEROSCOPY;  Surgeon: John Ferguson V, MD;  Location: [facility];  Service: Gynecology;  Laterality: N/A;  . KYPHOPLASTY N/A 08/02/2018   Procedure: KYPHOPLASTY T12;  Surgeon: Brooks, Dahari, MD;  Location: [facility];  Service: Orthopedics;  Laterality: N/A;  60 mins  . LAPAROSCOPIC TUBAL LIGATION Bilateral 03/24/2017   Procedure: LAPAROSCOPIC TUBAL LIGATION ([place] Rings);  Surgeon: John Ferguson V, MD;  Location: [facility];  Service: Gynecology;  Laterality: Bilateral;  . REPAIR VAGINAL CUFF N/A 07/30/2014   Procedure: REPAIR VAGINAL CUFF;  Surgeon: [name] Constant, MD;  Location: [facility];  Service: Gynecology;  Laterality: N/A;  . SAVORY DILATION  06/20/2012   Dr. Fields:Mild gastritis/Ulcer in the mid jejunum. Empiric dilation.   . SURAL NERVE BX Left 02/25/2016   Procedure: LEFT SURAL NERVE BIOPSY;  Surgeon: Robert Nudelman, MD;  Location: [facility];  Service: Neurosurgery;  Laterality: Left;  Left sural nerve biopsy  . TONSILLECTOMY    . TONSILLECTOMY AND ADENOIDECTOMY    . UPPER GASTROINTESTINAL ENDOSCOPY  JULY 2011 NAUSEA-D125,V6, PH 25   Bx; GASTRITIS, POUCH-5 CM LONG  . WISDOM TOOTH EXTRACTION      Family Psychiatric History: see below  Family History:  Family History  Problem Relation Age of Onset  . Hemochromatosis Maternal Grandmother   . Migraines Maternal Grandmother   . Cancer Maternal Grandmother   . Breast cancer Maternal Grandmother   . Hypertension Father   . Diabetes Father   . Coronary artery disease Father   . Migraines Paternal Grandmother   . Breast cancer Paternal Grandmother   . Cancer Mother        breast  . Hemochromatosis Mother   . Breast cancer Mother   . Depression Mother   . Anxiety disorder Mother   . Coronary artery disease Paternal Grandfather   . Anxiety disorder Brother   . Bipolar disorder Brother   .  Healthy Daughter   . Healthy Son     Social History:  Social History   Socioeconomic History  . Marital status: Divorced    Spouse   name: Not on file  . Number of children: 2  . Years of education: some college  . Highest education level: Some college, no degree  Occupational History  . Occupation: Applying for Social Security Disability    Employer: NOT EMPLOYED  Tobacco Use  . Smoking status: Former Smoker    Packs/day: 0.25    Years: 6.00    Pack years: 1.50    Types: Cigarettes    Start date: 05/2018  . Smokeless tobacco: Never Used  Vaping Use  . Vaping Use: Former  Substance and Sexual Activity  . Alcohol use: Not Currently    Alcohol/week: 0.0 standard drinks  . Drug use: Not Currently  . Sexual activity: Yes    Partners: Male    Birth control/protection: Surgical, Condom    Comment: tubal and ablation  Other Topics Concern  . Not on file  Social History Narrative   Patient is right handed.   Patient drinks one cup of coffee daily.   She lives in a one-story home with her two children (6 year old son, 16 month old daughter).   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 Difficulty of Paying Living Expenses: Very hard  Food Insecurity: No Food Insecurity  . Worried About Running Out of Food in the Last Year: Never true  . Ran Out of Food in the Last Year: Never true  Transportation Needs: No Transportation Needs  . Lack of Transportation (Medical): No  . Lack of Transportation (Non-Medical): No  Physical Activity: Inactive  . Days of Exercise per Week: 0 days  . Minutes of Exercise per Session: 0 min  Stress: Stress Concern Present  . Feeling of Stress : Very much  Social Connections: Moderately Isolated  . Frequency of Communication with Friends and Family: More than three times a week  . Frequency of Social Gatherings with Friends and  Family: More than three times a week  . Attends Religious Services: More than 4 times per year  . Active Member of Clubs or Organizations: No  . Attends Club or Organization Meetings: Never  . Marital Status: Divorced    Allergies:  Allergies  Allergen Reactions  . Gabapentin Other (See Comments)    Pt states that she was unresponsive after taking this medication, but her vitals remained stable.    . Metoclopramide Hcl Anxiety and Other (See Comments)    Pt states that she felt like she was trapped in a box, and could not get out.  Pt also states that she had temporary loss of movement, weakness, and tingling.    . Other Itching, Rash and Hives    Please use paper tape Other reaction(s): Other (See Comments) Please use paper tape  . Tramadol Other (See Comments)    Seizures  . Ativan [Lorazepam] Other (See Comments)    combative  . Latex Itching, Rash and Other (See Comments)    Burns  . Lyrica [Pregabalin] Other (See Comments)    suicidal  . Trazodone And Nefazodone Other (See Comments)    Makes pt "like a zombie"  . Zofran [Ondansetron] Other (See Comments)    Migraines in high doses    . Iron Other (See Comments)    PATIENT IS INTOLERANT TO ORAL IRON , SHE DOES NOT ABSORB IT.    . Sulfa Antibiotics Hives  . Butrans [Buprenorphine]   Rash    Patch caused rash, didn't help with pain  . Nucynta [Tapentadol] Nausea And Vomiting  . Propofol Nausea And Vomiting  . Tape Itching, Rash and Other (See Comments)    Please use paper tape  . Toradol [Ketorolac Tromethamine] Anxiety    Metabolic Disorder Labs: Lab Results  Component Value Date   HGBA1C 5.7 (H) 01/15/2015   Lab Results  Component Value Date   PROLACTIN 80.0 (H) 05/09/2015   Lab Results  Component Value Date   CHOL 95 03/10/2012   TRIG 50 03/10/2012   HDL 38 (L) 03/10/2012   CHOLHDL 2.5 03/10/2012   VLDL 10 03/10/2012   LDLCALC 47 03/10/2012   Lab Results  Component Value Date   TSH 2.616 08/25/2019    TSH 1.592 05/25/2019    Therapeutic Level Labs: No results found for: LITHIUM Lab Results  Component Value Date   VALPROATE 66 06/23/2020   No components found for:  CBMZ  Current Medications: Current Outpatient Medications  Medication Sig Dispense Refill  . promethazine (PHENERGAN) 25 MG tablet TAKE (1) TABLET BY MOUTH TWICE A DAY AS NEEDED. 14 tablet 2  . ADDERALL XR 30 MG 24 hr capsule TAKE 1 CAPSULE BY MOUTH DAILY 30 capsule 0  . AIMOVIG 70 MG/ML SOAJ SMARTSIG:70 Milligram(s) SUB-Q Every 4 Weeks    . BELBUCA 150 MCG FILM Take 1 strip by mouth 2 (two) times daily.    . budesonide-formoterol (SYMBICORT) 160-4.5 MCG/ACT inhaler Inhale 2 puffs into the lungs 2 (two) times daily. 1 Inhaler 5  . cetirizine (ZYRTEC) 10 MG tablet Take 10 mg by mouth daily.    . clonazePAM (KLONOPIN) 0.5 MG tablet Take 1 tablet (0.5 mg total) by mouth 2 (two) times daily. 60 tablet 2  . DULoxetine (CYMBALTA) 60 MG capsule Take 1 capsule (60 mg total) by mouth 2 (two) times daily. 180 capsule 2  . HYDROmorphone (DILAUDID) 2 MG tablet Take 2 tablets (4 mg total) by mouth every 12 (twelve) hours as needed for severe pain. Take 1-2 tablets po q 4 hrs prn pain no greater than 9 tablets/day 1 tablet 0  . ondansetron (ZOFRAN-ODT) 8 MG disintegrating tablet Take 8 mg by mouth every 8 (eight) hours as needed.    . pantoprazole (PROTONIX) 40 MG tablet TAKE 1 TABLET BY MOUTH TWICE DAILY BEFORE MEALS. (Patient taking differently: Take 40 mg by mouth 2 (two) times daily. ) 60 tablet 3  . traZODone (DESYREL) 150 MG tablet TAKE (1) TABLET BY MOUTH AT BEDTIME. 30 tablet 0  . valACYclovir (VALTREX) 1000 MG tablet TAKE 1 TABLET BY MOUTH DAILY. 90 tablet PRN   No current facility-administered medications for this visit.     Musculoskeletal: Strength & Muscle Tone: within normal limits Gait & Station: normal Patient leans: N/A  Psychiatric Specialty Exam: Review of Systems  Constitutional: Positive for fatigue.   Musculoskeletal: Positive for back pain.  Psychiatric/Behavioral: Positive for decreased concentration and sleep disturbance. The patient is nervous/anxious.   All other systems reviewed and are negative.   There were no vitals taken for this visit.There is no height or weight on file to calculate BMI.  General Appearance: Casual and Disheveled  Eye Contact:  Good  Speech:  Clear and Coherent  Volume:  Decreased  Mood:  Anxious and Dysphoric  Affect:  Constricted  Thought Process:  Goal Directed  Orientation:  Full (Time, Place, and Person)  Thought Content: Rumination   Suicidal Thoughts:  No  Homicidal Thoughts:    No  Memory:  Immediate;   Good Recent;   Good Remote;   Good  Judgement:  Poor  Insight:  Shallow  Psychomotor Activity:  Decreased  Concentration:  Concentration: Fair and Attention Span: Fair  Recall:  Fair  Fund of Knowledge: Good  Language: Good  Akathisia:  No  Handed:  Right  AIMS (if indicated): not done  Assets:  Communication Skills Desire for Improvement Resilience Social Support Talents/Skills  ADL's:  Intact  Cognition: WNL  Sleep:  Poor   Screenings: AIMS     [facility]  AIMS Total Score 0    AUDIT     Patient Outreach Telephone from 07/14/2020 in [facility] Admission (Discharged[facility] ED to Hosp-Admission (Discharged) [facility]  Alcohol Use Disorder Identification Test Final Score (AUDIT) 1 1 0    GAD-7     Counselor from 12/11/2018 in [facility] Counselor from 02/07/2017 [facility]  Total GAD-7 Score 19 17    PHQ2-9     Patient Outreach Telephone from 07/14/2020 in [facility] ED from 06/17/2020  in [place] [facility] Counselor from 12/11/2018 in [facility] PROGRAM Counselor from 07/22/2017 in [facility]- Counselor from 02/07/2017 in [facility]-  PHQ-2 Total Score 4 4 6 3 2  PHQ-9 Total Score 19 22 22 15 14       Assessment and Plan: This patient is a 34-year-old female with a history of depression anxiety ADHD and borderline personality traits.  She is not doing well right now and I would guess this is due to both her recent move away from her fianc as well as having had coronavirus.  She has not picked up the Adderall XR so I have urged her to do so at the 30 mg dose which may help her stay awake during the day.  We will try Ambien 10 mg at bedtime for sleep.  She will continue Cymbalta 60 mg twice daily for depression and will increase BuSpar to 10 mg 3 times daily for anxiety.  She can continue the clonazepam 0.5 mg twice daily for anxiety but I cannot increase this due to her use of narcotics for pain.  She will return to see me in 4 weeks and she will restart her therapy    , MD 10/06/2020, 11:30 AM 

## 2020-10-07 ENCOUNTER — Ambulatory Visit (INDEPENDENT_AMBULATORY_CARE_PROVIDER_SITE_OTHER): Payer: Medicaid Other | Admitting: Clinical

## 2020-10-07 ENCOUNTER — Other Ambulatory Visit: Payer: Self-pay

## 2020-10-07 ENCOUNTER — Telehealth (HOSPITAL_COMMUNITY): Payer: Self-pay | Admitting: Psychiatry

## 2020-10-07 ENCOUNTER — Other Ambulatory Visit (HOSPITAL_COMMUNITY): Payer: Self-pay | Admitting: Psychiatry

## 2020-10-07 DIAGNOSIS — F332 Major depressive disorder, recurrent severe without psychotic features: Secondary | ICD-10-CM

## 2020-10-07 DIAGNOSIS — F411 Generalized anxiety disorder: Secondary | ICD-10-CM | POA: Diagnosis not present

## 2020-10-07 MED ORDER — ESTAZOLAM 2 MG PO TABS: 2.0000 mg | ORAL_TABLET | Freq: Every day | ORAL | 0 refills | Status: DC

## 2020-10-07 MED ORDER — AMPHETAMINE-DEXTROAMPHET ER 20 MG PO CP24: 20.0000 mg | ORAL_CAPSULE | ORAL | 0 refills | Status: DC

## 2020-10-07 NOTE — Telephone Encounter (Signed)
Prosom sent in and adderall reduced. She SHOULD NOT take more than the dose on the bottle. E.g Ambien should be only one tab at bedtime, same goes for prosom. And she should not combine sleeping pills with other meds

## 2020-10-07 NOTE — Telephone Encounter (Signed)
Dr. Harrington Challenger, pt called in advising the new RX ambien, did not work for her she took 2 tablets last night and did not go to sleep last night. She is asking for something that will help. Patient also is asking if she can get 20mg  of adderall instead of 30mg .

## 2020-10-07 NOTE — Progress Notes (Signed)
  Virtual Visit via Telephone Note  I connected withEmily Piccini on 07/28/20 at  9:00 AM EDT by telephoneand verified that I am speaking with the correct person using two identifiers.  Location: Patient:Home Provider:Office  I discussed the limitations, risks, security and privacy concerns of performing an evaluation and management service by telephone and the availability of in person appointments. I also discussed with the patient that there may be a patient responsible charge related to this service. The patient expressed understanding and agreed to proceed.       THERAPIST PROGRESS NOTE  Session Time:9:00AM-9:55AM  Participation Level:Active  Behavioral Response:CasualAlertDepressedand NA  Type of Therapy:Individual Therapy  Treatment Goals addressed:Coping  Interventions:CBT, Motivational Interviewing, Strength-based and Supportive  Summary:Penny Hennisis 33 presents with GAD and MDD.The OPT therapist worked with thepatientfor herinitial OPT treatment session. The OPT therapist utilized Motivational Interviewing to assist in creating therapeutic repore. The patient in the session was engaged and work in Science writer about hertriggers and symptoms over the past few weeksincludingmaking the decision to move out of the home she currently was living in with her children, fiance, and his children into a apartment with just her and her children while continuing the relationship with.The OPT therapist worked with the patient implementing elements of CBT.The OPT therapist provided psycho-education during the session.The OPT therapist worked with the patient on using her supports and communicating her feelings while currently living with her Mother and Step- Father until she transitions to an apartment.  Suicidal/Homicidal:Nowithout intent/plan  Therapist Response:The OPT therapist worked with the patient for  the patientsscheduled session. The patient was engaged in hersession and gave feedback in relation to triggers, symptoms, and behavior responses over the pastfewweeks. The patienthas been having difficultymanaging symptoms of her GAD and MDD while going through transition of moving with her children to an apartment .The OPT therapist in sessionworkedwith the patient and utilized elements of CBT in working with the patient to help empower the patient whohas decided separating her living while keeping her relationship with her fiance.The OPT therapist worked with the patient on, positive thinking, communication, and coping strategies.  Plan: Return again in2/3weeks.  Diagnosis:Axis I:Depression and GAD  Axis II:No diagnosis  I discussed the assessment and treatment plan with the patient. The patient was provided an opportunity to ask questions and all were answered. The patient agreed with the plan and demonstrated an understanding of the instructions.  The patient was advised to call back or seek an in-person evaluation if the symptoms worsen or if the condition fails to improve as anticipated.  I provided70minutes of non-face-to-face time during this encounter.  Penny Grumbles, LCSW 10/07/2020

## 2020-10-07 NOTE — Telephone Encounter (Signed)
Informed patient with what provider stated and she verbalized understanding.  

## 2020-10-09 ENCOUNTER — Ambulatory Visit: Payer: Medicaid Other | Admitting: Family Medicine

## 2020-10-09 ENCOUNTER — Encounter: Payer: Self-pay | Admitting: Family Medicine

## 2020-10-09 ENCOUNTER — Other Ambulatory Visit: Payer: Self-pay

## 2020-10-09 VITALS — BP 118/72 | Temp 97.0°F | Ht 66.0 in | Wt 164.4 lb

## 2020-10-09 DIAGNOSIS — M8000XA Age-related osteoporosis with current pathological fracture, unspecified site, initial encounter for fracture: Secondary | ICD-10-CM

## 2020-10-09 MED ORDER — PROMETHAZINE HCL 25 MG PO TABS
ORAL_TABLET | ORAL | 2 refills | Status: DC
Start: 2020-10-09 — End: 2020-12-19

## 2020-10-09 MED ORDER — ONDANSETRON 8 MG PO TBDP
8.0000 mg | ORAL_TABLET | Freq: Three times a day (TID) | ORAL | 3 refills | Status: DC | PRN
Start: 2020-10-09 — End: 2020-12-04

## 2020-10-09 NOTE — Progress Notes (Signed)
   Subjective:    Patient ID: Penny [name], female    DOB: Dec 23, 1985, 34 y.o.   MRN: [id number]  HPI  Patient arrives for a follow up on osteoporosis. Patient states she saw he back doctor yesterday for evaluation of compression fractures. Patient has osteoporosis She needs referral to a new endocrinologist to get this under control Because of her young age I believe it is in her best interest to be seen by endocrinology for this issue Patient is trying to do better nutritionally her weight is stable   Review of Systems Denies any major setbacks still has her usual underlying health issues she is a very nice patient who is trying hard    Objective:   Physical Exam Lungs clear respiratory rate normal heart regular pulse normal extremities no edema       Assessment & Plan:  Blood pressure good control Following through with her various specialist for pain management and behavioral health Osteoporosis referral to endocrinology Follow-up here as needed Weight under good control currently

## 2020-10-10 ENCOUNTER — Other Ambulatory Visit: Payer: Self-pay

## 2020-10-10 IMAGING — CT CT CHEST W/ CM
2 of 4 series · 15 of 36 positions shown, 18 images · IV contrast (omnipaque)
Comparison: None.

CLINICAL DATA: Pulmonary nodule, abnormal chest x-ray

EXAM:
CT CHEST WITH CONTRAST
TECHNIQUE: Multidetector CT imaging of the chest was performed during
intravenous contrast administration.
CONTRAST:  55mL OMNIPAQUE IOHEXOL 300 MG/ML  SOLN

[Series 2: routine chest with · axial · 0.69mm/px · z∈[-179,+91]mm · 12 of 159 slices shown, 15 images]
[im 12/159  mediastinal]
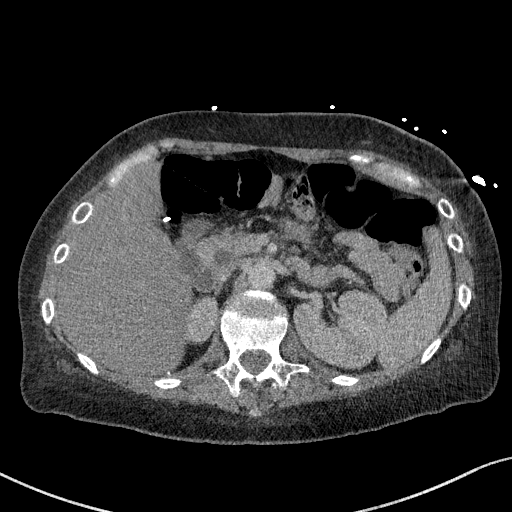
[im 12/159  lung]
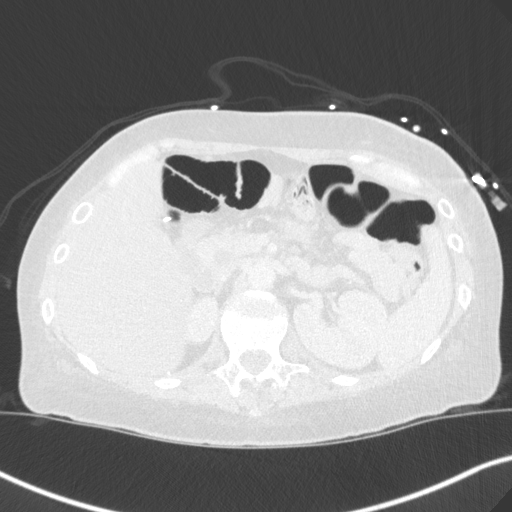
[im 23/159  lung]
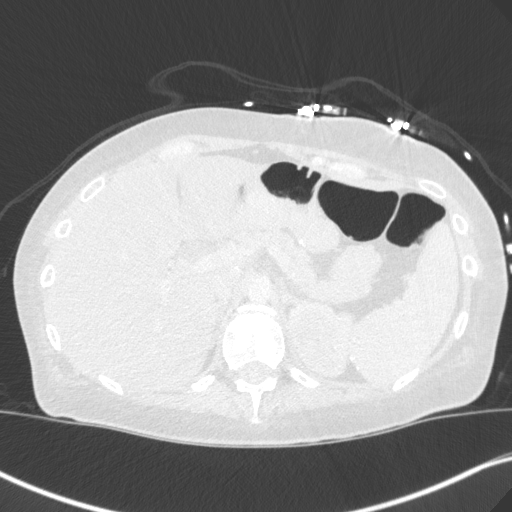
[im 34/159  lung]
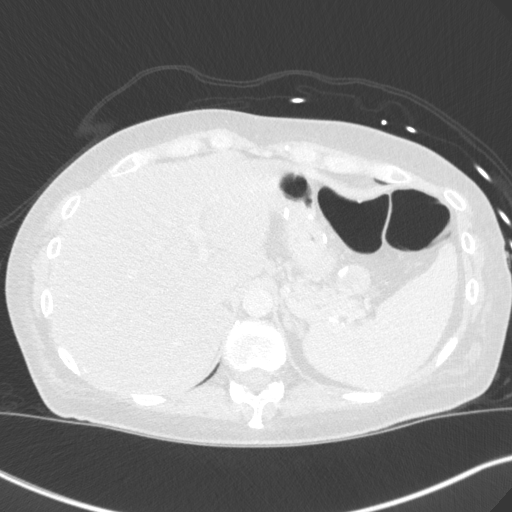
[im 46/159  lung]
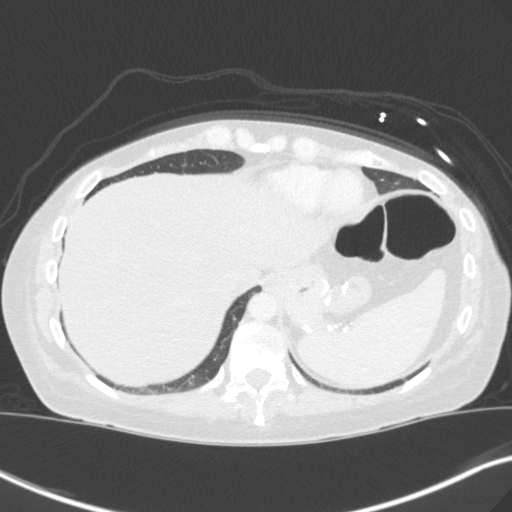
[im 57/159  mediastinal]
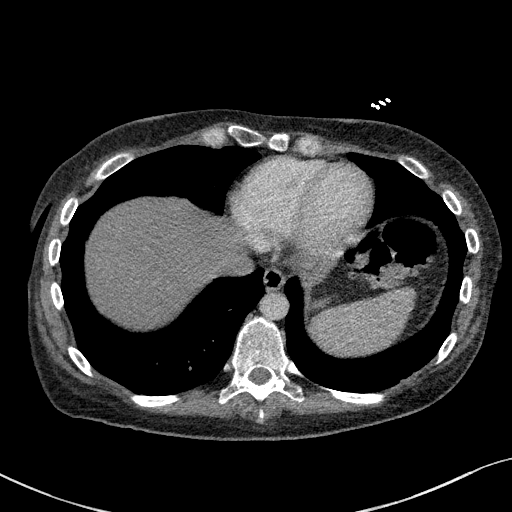
[im 57/159  lung]
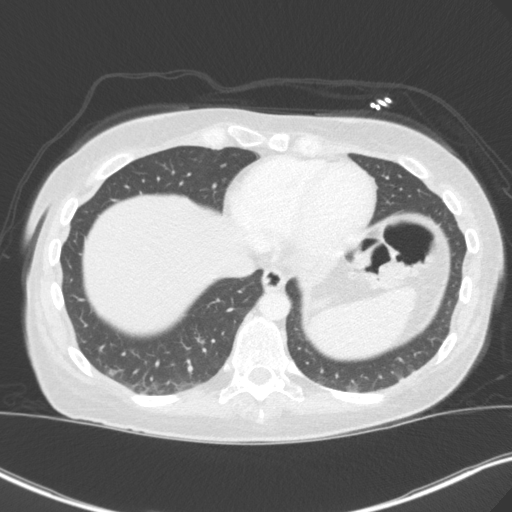
[im 68/159  lung]
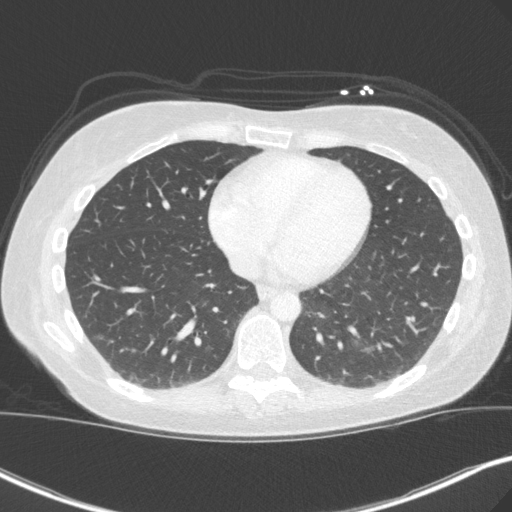
[im 91/159  lung]
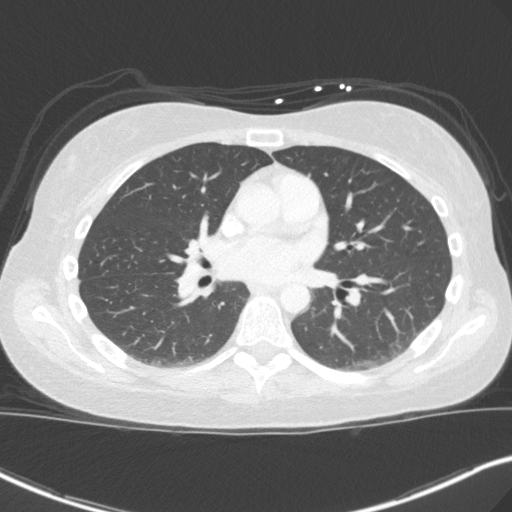
[im 102/159  lung]
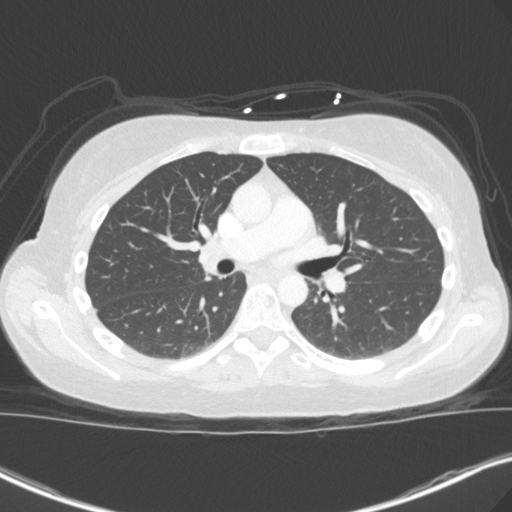
[im 113/159  mediastinal]
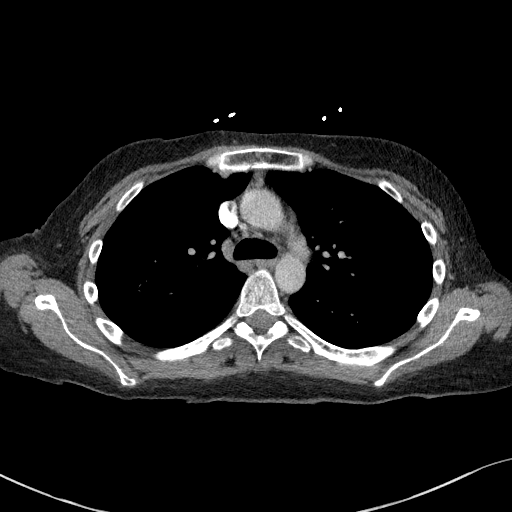
[im 113/159  lung]
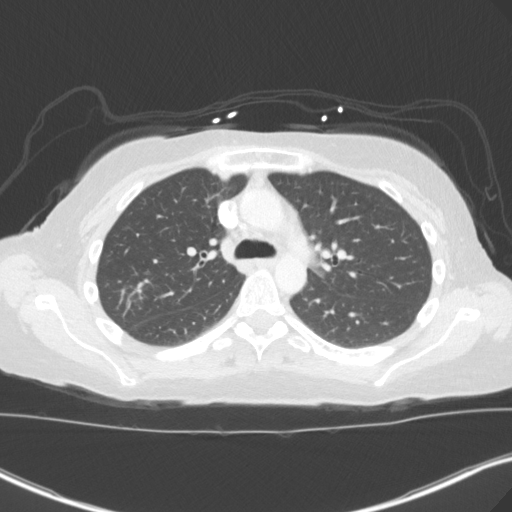
[im 125/159  lung]
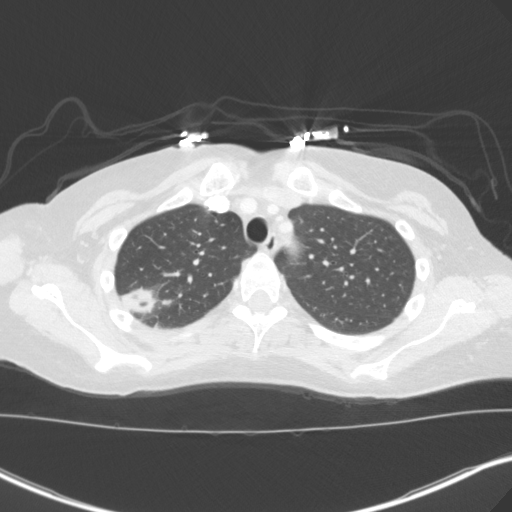
[im 136/159  lung]
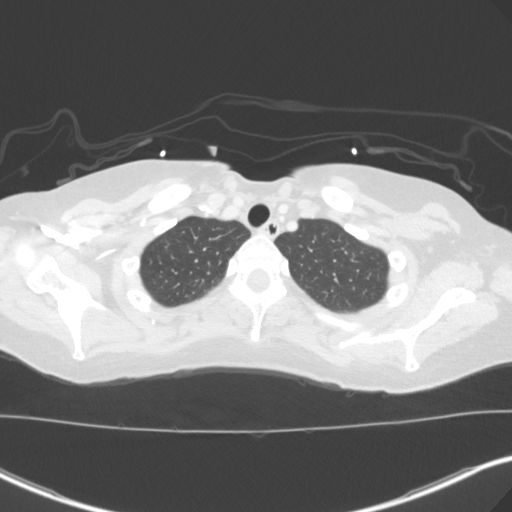
[im 147/159  lung]
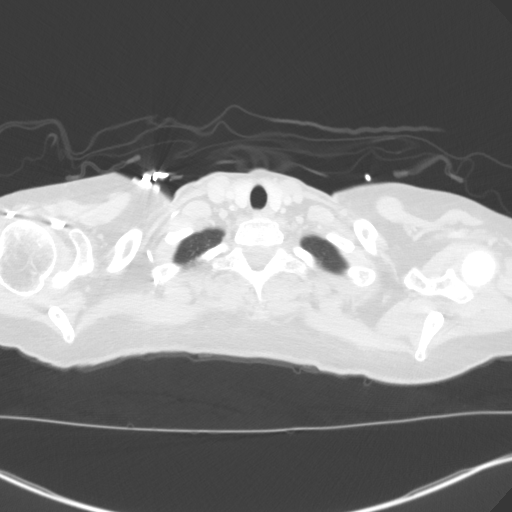

[Series 5: coronal · coronal · 0.64mm/px · 3 of 116 slices shown]
[im 24/116  lung]
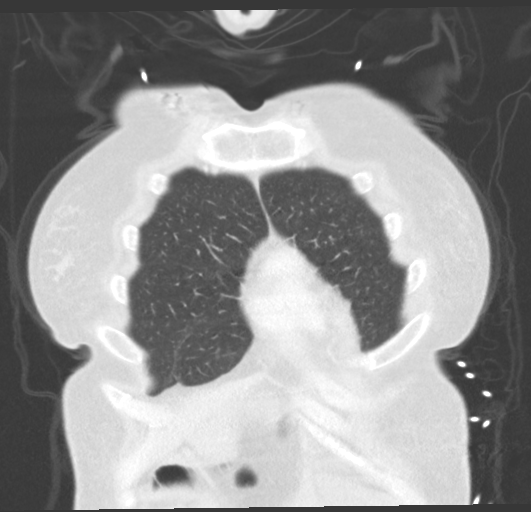
[im 47/116  lung]
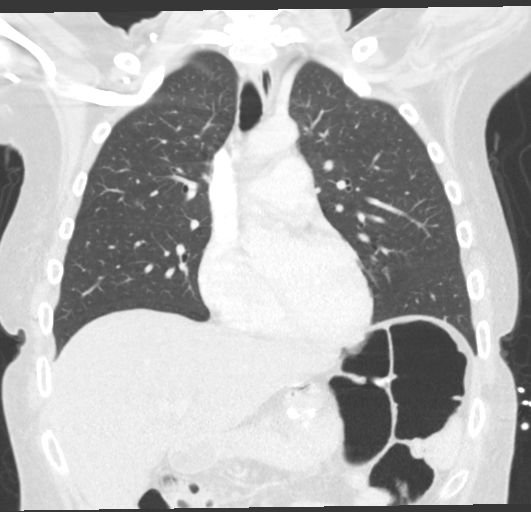
[im 70/116  lung]
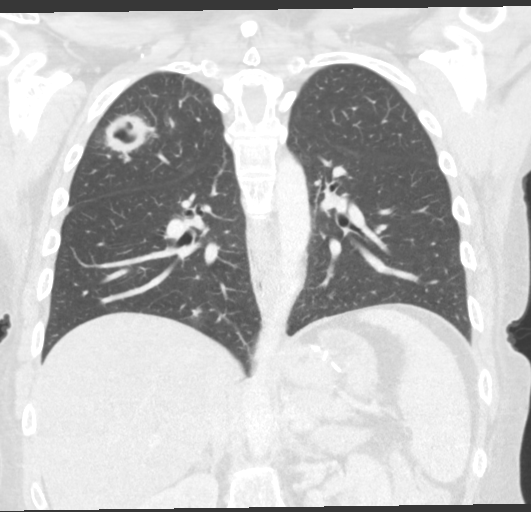

[15 of 36 positions shown; findings below may reference images not displayed]

FINDINGS: Cardiovascular: No significant vascular findings. Normal heart size.
No pericardial effusion.

Mediastinum/Nodes: No enlarged mediastinal, hilar, or axillary lymph
nodes. Thyroid gland, trachea, and esophagus demonstrate no
significant findings.

Lungs/Pleura: The abnormality on recent chest radiograph represents
a cavitary pulmonary nodule measuring 2.2 x 2.9 cm on axial image #
39 demonstrating a small air-fluid level and a relatively uniform
soft tissue rind. While nonspecific, this suggests a post infectious
or post inflammatory etiology. Scattered peribronchial infiltrates
are seen within the adjacent right upper lobe suggesting an
infectious or inflammatory etiology. There is scattered ground-glass
and nodular infiltrates noted within the lower lobes bilaterally,
right greater than left, likely infectious or inflammatory in
etiology. No pneumothorax or pleural effusion. The central airways
are widely patent.

Upper Abdomen: Surgical changes of gastric bypass and
cholecystectomy are identified.

Musculoskeletal: T12 vertebroplasty has been performed. There is a a
severe wedge compression fracture of L1, new since prior CT
examination, with approximately 60% loss of height. There is no
retropulsion identified. There is no significant paravertebral soft
tissue swelling to suggest an acute fracture.
IMPRESSION: 1. The abnormality on recent chest radiograph represents a cavitary
pulmonary nodule measuring 2.2 x 2.9 cm, possibly result of recent
infection. Follow-up to resolution is recommended.
2. Scattered ground-glass and nodular infiltrates within the right
upper lobe and bilateral lower lobes, right greater than left,
likely infectious or inflammatory in etiology.
3. Severe wedge compression fracture of L1, new since prior CT
examination but likely chronic in acuity, with approximately 60%
loss of height. There is no retropulsion identified.

## 2020-10-10 IMAGING — DX DG CHEST 1V PORT
1 series · 1 of 1 positions shown · non-contrast
Comparison: CT angiography 10/20/2019, radiograph 06/17/2020

CLINICAL DATA: Shortness of breath, concern for asthma attack

EXAM:
PORTABLE CHEST 1 VIEW

[chest ap]
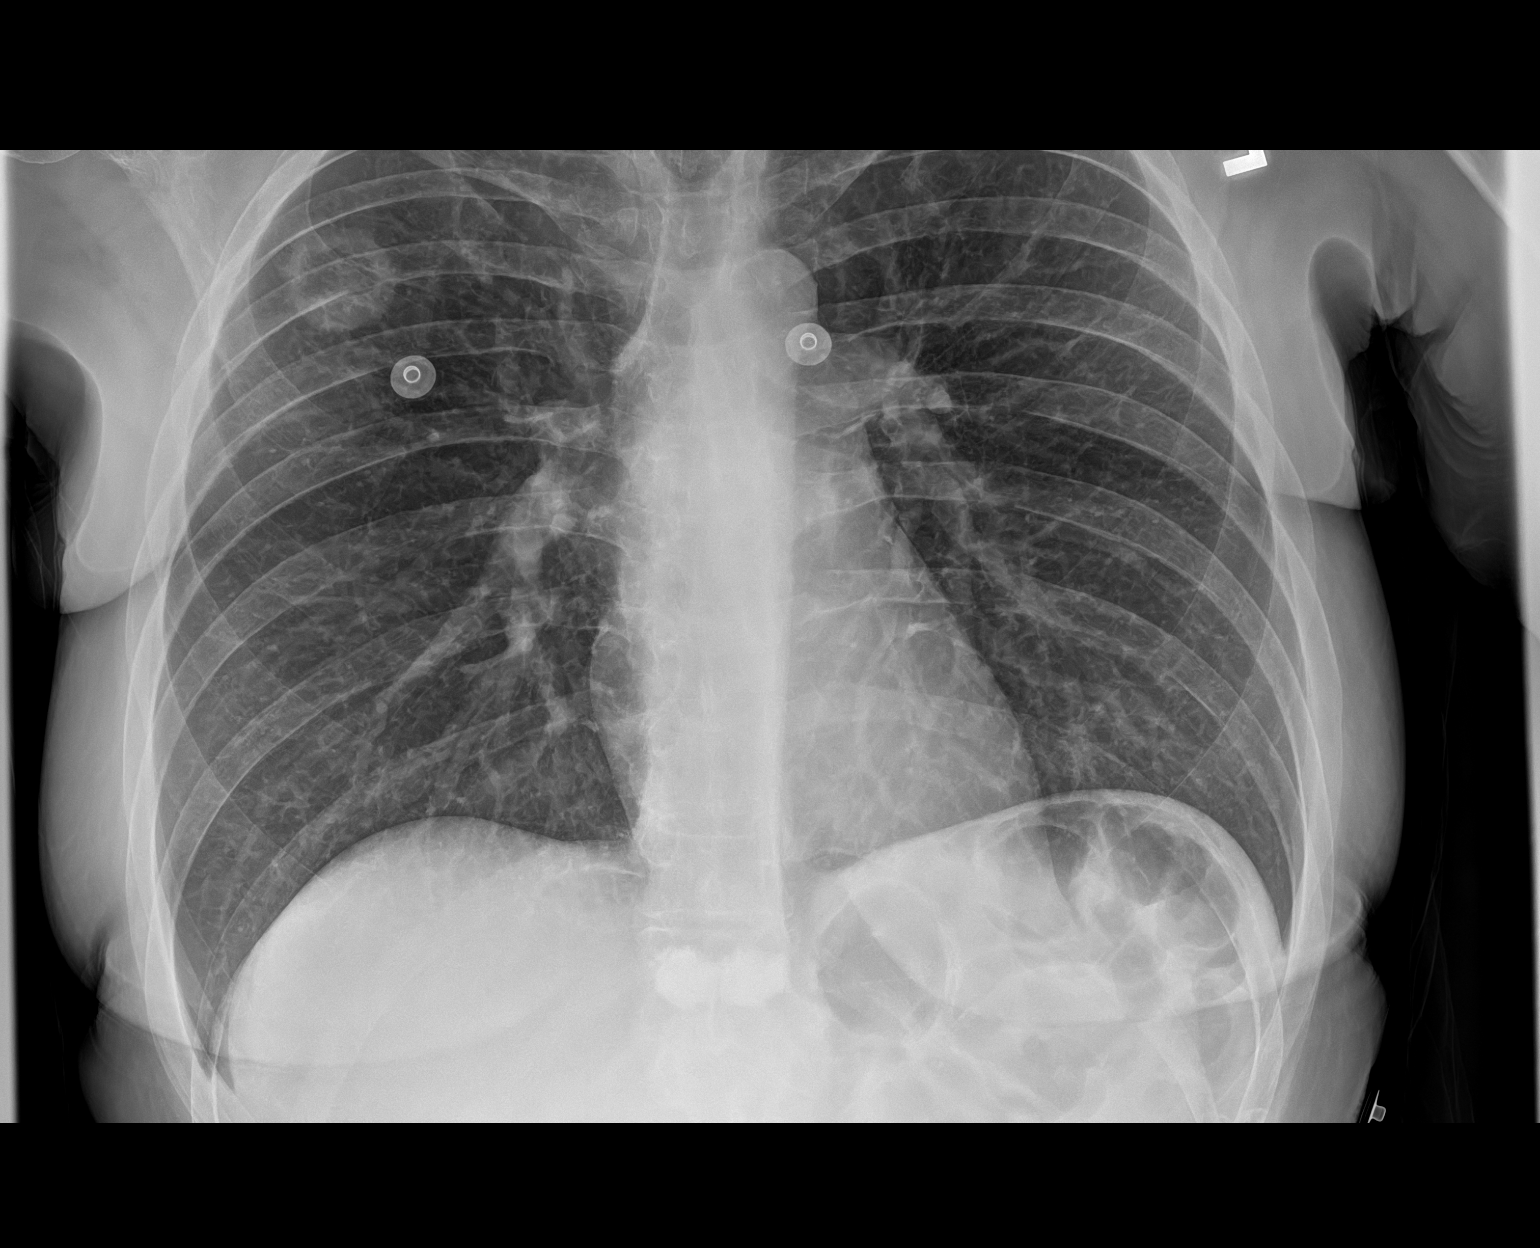

[1 of 1 positions shown; findings below may reference images not displayed]

FINDINGS: Cavitary appearing lesion is seen in the right upper lung. Mildly
coarsened reticular changes in the lungs are similar to comparison
studies and may reflect patient's underlying reactive airways
disease. The cardiomediastinal contours are unremarkable. Prior
vertebral augmentation at the thoracolumbar junction. Surgical
material seen in the right upper quadrant as well. No other acute or
significant soft tissue or osseous abnormality.
IMPRESSION: Cavitary appearing lesion in the right upper lung, most concerning
for infectious etiology given rapid interval development since [REDACTED]
and patient's age. Recommend further evaluation with cross-sectional
imaging.

Coarsened reticular changes in the lungs are similar to comparison
studies and may reflect patient's underlying reactive airways
disease.

## 2020-10-10 NOTE — Patient Instructions (Signed)
The patient is currently active with external therapist and medication management provider and does not require LCSW involvement from HR MM team at this time.  Netta Neat, BSW, MSW, LCSW Social Work Case Freight forwarder - [place]  Direct [place]: [phone]

## 2020-10-10 NOTE — Patient Outreach (Signed)
Care Coordination  10/10/2020  Helyn Mikita 1986/10/14 [id number]  The patient is currently active with external therapist and medication management provider and does not require LCSW involvement from HR MM team at this time.   Netta Neat, BSW, MSW, LCSW Social Work Case Freight forwarder - [place]  Direct [place]: [phone]

## 2020-10-16 ENCOUNTER — Encounter: Payer: Self-pay | Admitting: Family Medicine

## 2020-10-16 ENCOUNTER — Telehealth: Payer: Self-pay | Admitting: Student

## 2020-10-16 NOTE — Telephone Encounter (Signed)
For the past week and a half, patient has been having reoccurring dreams of falling. Last night, it felt like her heart was stopping. Would like to speak to nurse about her experience.

## 2020-10-16 NOTE — Telephone Encounter (Signed)
Up until recently patient was taking Ambien 10 mg nightly along with phenergan at times and clonazepam twice a day. She stopped Ambien a few days ago.She reports restless legs and wonders if there is something she can take.I advised her to speak with Dr.Luking.   Her sx's only occurred while asleep,not during waking hours.

## 2020-10-17 ENCOUNTER — Other Ambulatory Visit (HOSPITAL_COMMUNITY): Payer: Self-pay | Admitting: Psychiatry

## 2020-10-17 ENCOUNTER — Telehealth (HOSPITAL_COMMUNITY): Payer: Self-pay | Admitting: *Deleted

## 2020-10-17 ENCOUNTER — Other Ambulatory Visit: Payer: Self-pay | Admitting: Family Medicine

## 2020-10-17 MED ORDER — ROPINIROLE HCL 0.5 MG PO TABS: 0.5000 mg | ORAL_TABLET | Freq: Every day | ORAL | 3 refills | Status: DC

## 2020-10-17 MED ORDER — TRAZODONE HCL 100 MG PO TABS: 200.0000 mg | ORAL_TABLET | Freq: Every day | ORAL | 2 refills | Status: DC

## 2020-10-17 NOTE — Telephone Encounter (Signed)
Tell her trazodone 200 mg sent in

## 2020-10-17 NOTE — Telephone Encounter (Signed)
Patient called and LMOM stating that she didn't realize she was doing this until she was told that she was sleep walking. Per pt she immediately stopped her Ambien. Pt would like to know if provider could put her back on the Trazodone and see if it was possible for her to increase the dose or something.

## 2020-10-17 NOTE — Telephone Encounter (Signed)
Patient informed and she verbalized understanding

## 2020-10-24 ENCOUNTER — Encounter: Payer: Self-pay | Admitting: Family Medicine

## 2020-10-24 DIAGNOSIS — Z79899 Other long term (current) drug therapy: Secondary | ICD-10-CM | POA: Diagnosis not present

## 2020-10-24 DIAGNOSIS — M810 Age-related osteoporosis without current pathological fracture: Secondary | ICD-10-CM | POA: Diagnosis not present

## 2020-10-24 DIAGNOSIS — M546 Pain in thoracic spine: Secondary | ICD-10-CM | POA: Diagnosis not present

## 2020-10-26 IMAGING — CT CT HEAD W/O CM
3 series · 15 of 47 positions shown, 18 images · non-contrast
Comparison: Head CT scan 06/09/2017.

CLINICAL DATA: Headache and dizziness since a syncopal episode
resulting in a fall in May 2020.

EXAM:
CT HEAD WITHOUT CONTRAST
TECHNIQUE: Contiguous axial images were obtained from the base of the skull
through the vertex without intravenous contrast.

[Series 2: head w o · axial · 0.42mm/px · z∈[-31,+94]mm · 9 of 31 slices shown, 12 images]
[im 3/31  brain]
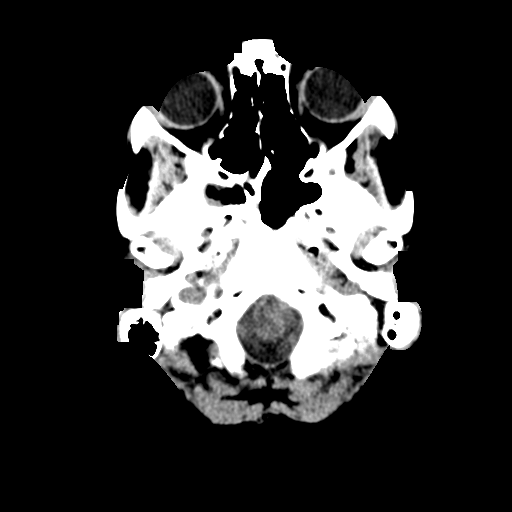
[im 3/31  bone]
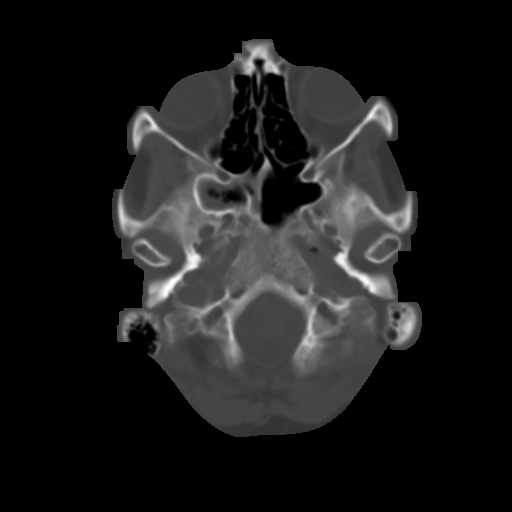
[im 6/31  brain]
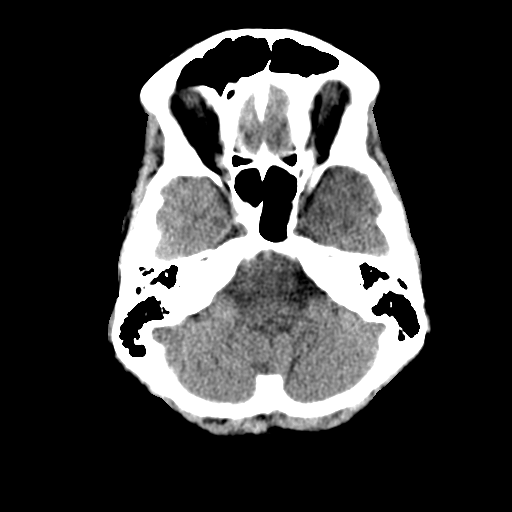
[im 9/31  brain]
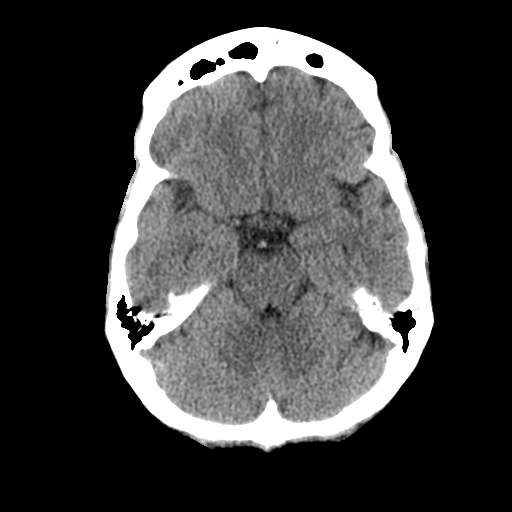
[im 12/31  brain]
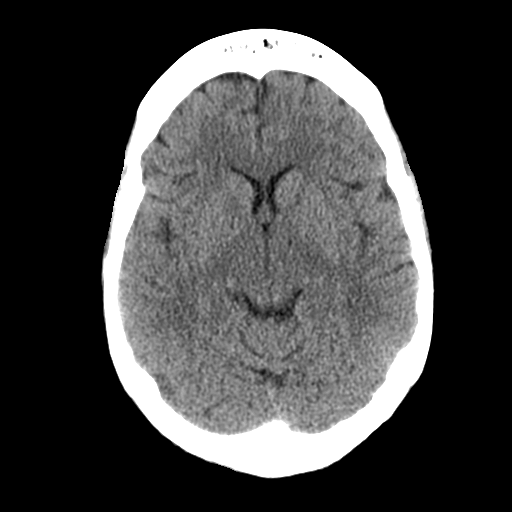
[im 16/31  brain]
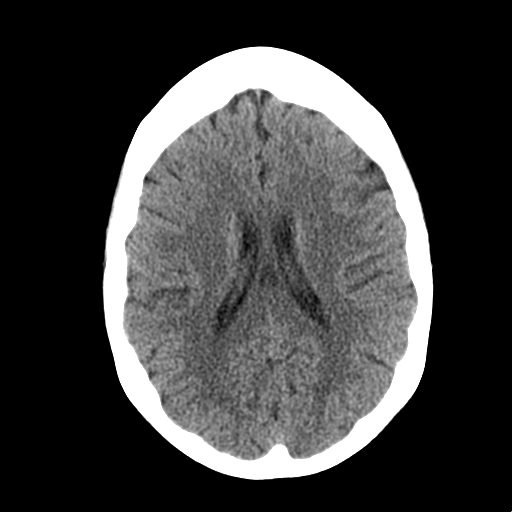
[im 16/31  bone]
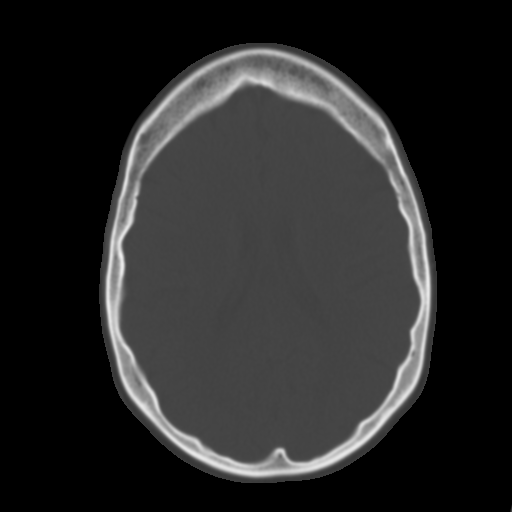
[im 19/31  brain]
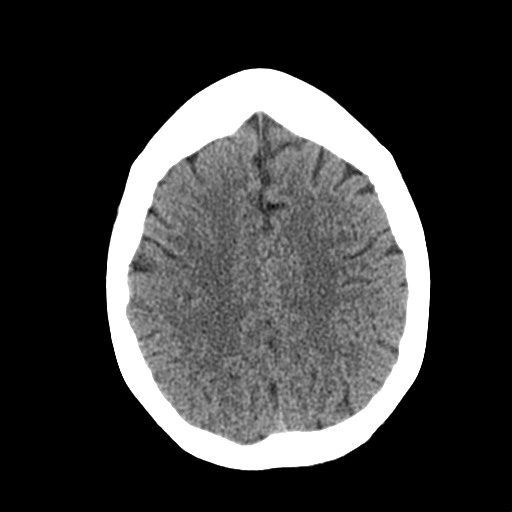
[im 22/31  brain]
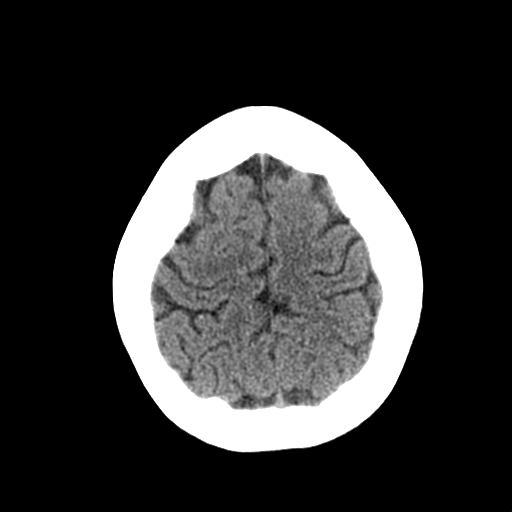
[im 25/31  brain]
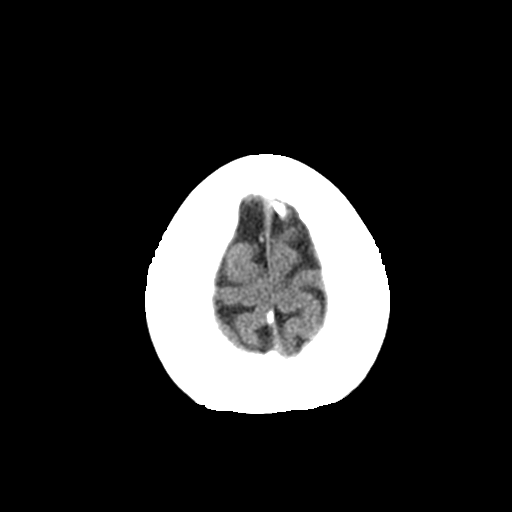
[im 28/31  brain]
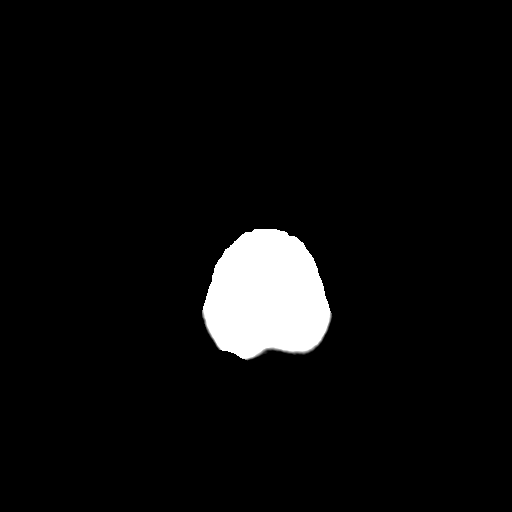
[im 28/31  bone]
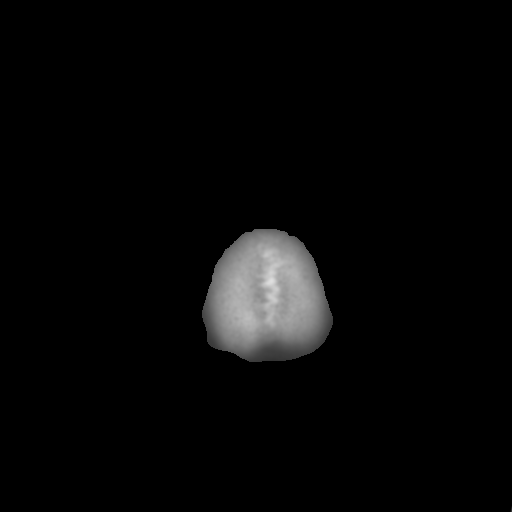

[Series 4: coronal soft · coronal · 0.28mm/px · 3 of 64 slices shown]
[im 22/64  brain]
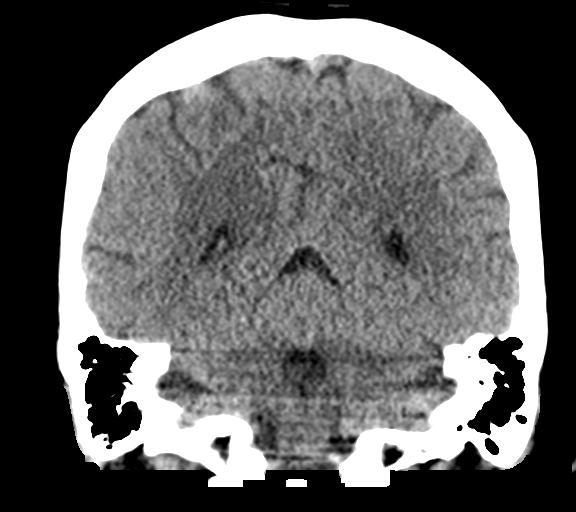
[im 29/64  brain]
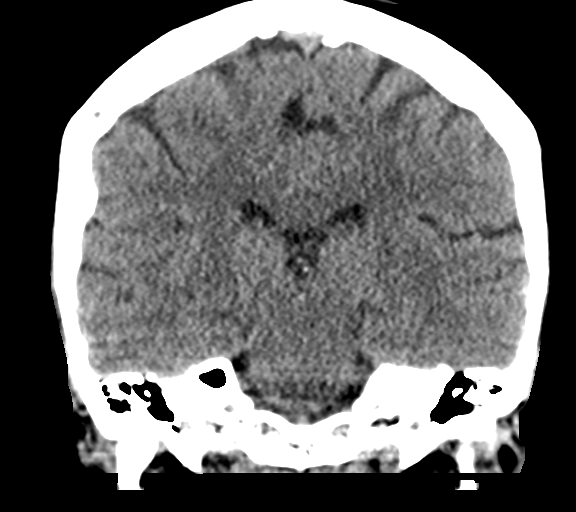
[im 36/64  brain]
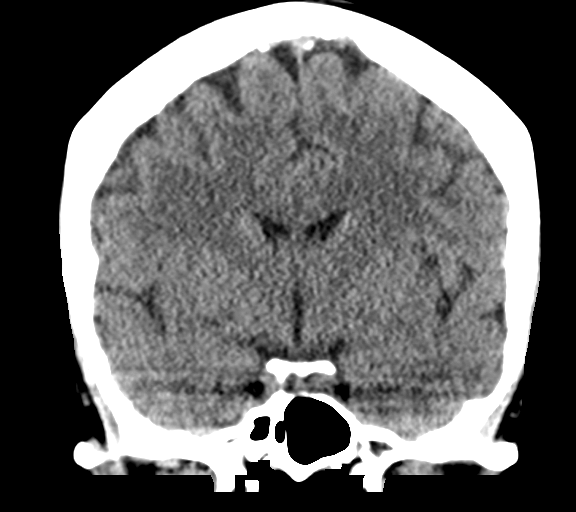

[Series 5: sagittal soft · sagittal · 0.30mm/px · 3 of 51 slices shown]
[im 17/51  brain]
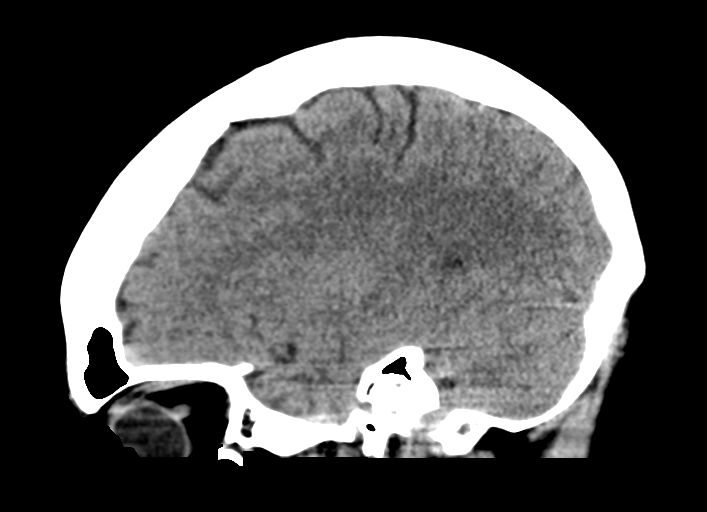
[im 26/51  brain]
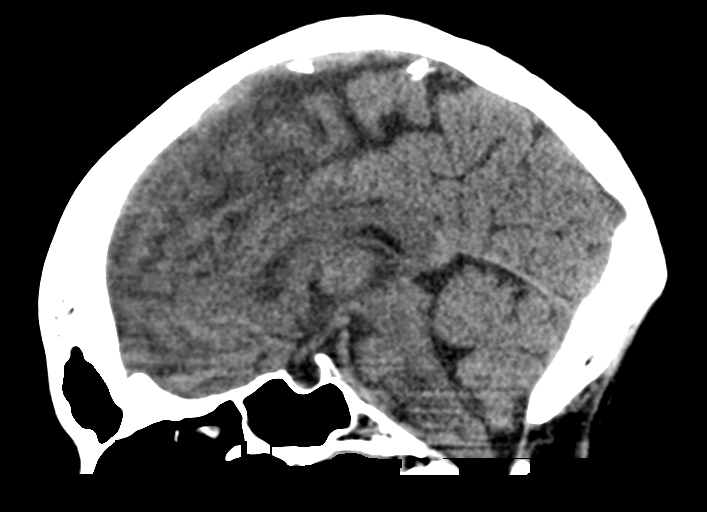
[im 34/51  brain]
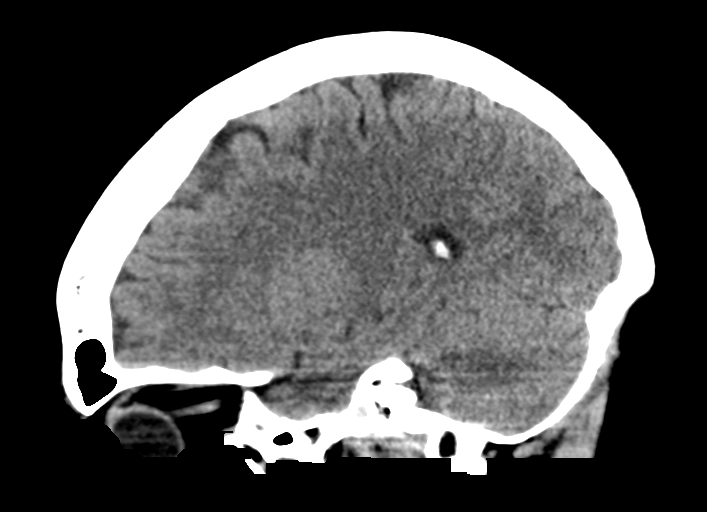

[15 of 47 positions shown; findings below may reference images not displayed]

FINDINGS: Brain: No evidence of acute infarction, hemorrhage, hydrocephalus,
extra-axial collection or mass lesion/mass effect.

Vascular: No hyperdense vessel or unexpected calcification.

Skull: Intact.  No focal lesion.

Sinuses/Orbits: Mucosal thickening is seen in the right sphenoid
sinus. Otherwise negative.

Other: None.
IMPRESSION: No acute intracranial abnormality.

Mild mucosal thickening right sphenoid sinus.

## 2020-10-26 NOTE — Progress Notes (Deleted)
Cardiology Office Note    Date:  10/26/2020   ID:  Penny [name], DOB 22-Apr-1986, MRN [id number]  PCP:  Kathyrn Drown, MD  Cardiologist: Jenkins Rouge, MD    No chief complaint on file.   History of Present Illness:    Penny [name] is a 34 y.o. female with past medical history gastric bypass, anemia, palpitations (rare PAC's and PVC's by prior monitor), anxiety and depression who presents to the office today for 26-month follow-up.   She most recently had a telehealth visit with Cecilie Kicks, NP in 08/2020 and reported episodes of weakness and her heart racing. HR had previously been variable from the 30's to 50's during a recent ED visit. A repeat 14-day monitor was recommended and an in-office visit was recommended so orthostatics could be obtained. Her monitor was delayed given recent COVID-19 diagnosis. Her monitor showed NSR with HR ranging from 35 to 184 bpm and average HR of 84. She had one episode of SVT lasting for 11 beats and rare PAC's and PVC's.     Past Medical History:  Diagnosis Date  . ADD (attention deficit disorder)   . Anemia 2011   2o to GASTRIC BYPASS  . Anginal pain ([place])   . Anxiety   . Asthma   . Blood transfusion without reported diagnosis   . Chronic daily headache   . Depression   . Depression   . Dysmenorrhea 09/18/2013  . Dysrhythmia   . Elevated liver enzymes JUL 2011ALK PHOS 111-127 AST  143-267 ALT  213-321T BILI 0.6  ALB  3.7-4.06 Jun 2011 ALK PHOS 118 AST 24 ALT 42 T BILI 0.4 ALB 3.9  . Encounter for drug rehabilitation    at behavioral health for opioid addiction about 4 years ago  . Family history of adverse reaction to anesthesia    'dad had to be kept on pump for breathing for morphine'  . Fatty liver   . Fibroid 01/18/2017  . Fibromyalgia   . Gastric bypass status for obesity   . Gastritis JULY 2011  . Heart murmur   . 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 History of Holter monitoring   . Hx of opioid  abuse ([place])    for about 4 years, about 4 years ago  . Interstitial cystitis   . Iron deficiency anemia 07/23/2010  . Irritable bowel syndrome 2012 DIARRHEA   JUN 2012 TTG IgA 14.9  . IUD FEB 2010  . Lupus ([place])   . Menorrhagia 07/18/2013  . Migraines   . Obesity (BMI 30-39.9) 2011 228 LBS BMI 36.8  . Ovarian cyst   . Patient desires pregnancy 09/18/2013  . Polyneuropathy   . PONV (postoperative nausea and vomiting) seizure post-operatively   seizure following ankle surgery  . Potassium (K) deficiency   . Pregnant 12/25/2013  . Psychiatric pseudoseizure   . RLQ abdominal pain 07/18/2013  . Sciatica of left side 11/14/2014  . 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 Stress 09/18/2013    Past Surgical History:  Procedure Laterality Date  . BIOPSY  04/10/2015   Procedure: BIOPSY;  Surgeon: Daneil Dolin, MD;  Location: [facility];  Service: Endoscopy;;  . cath self every nite     for sodium bicarb injection (discontinued 2013)  . CHOLECYSTECTOMY  2005   biliary dyskinesia  . COLONOSCOPY  JUN 2012 ABD PN/DIARRHEA WITH PROPOFOL   NL COLON  . DILATION AND CURETTAGE OF UTERUS    .  DILITATION & CURRETTAGE/HYSTROSCOPY WITH NOVASURE ABLATION N/A 03/24/2017   Procedure: DILATATION & CURETTAGE/HYSTEROSCOPY WITH NOVASURE ENDOMETRIAL ABLATION;  Surgeon: Tilda Burrow, MD;  Location: [facility];  Service: Gynecology;  Laterality: N/A;  . ESOPHAGEAL DILATION N/A 04/10/2015   Procedure: ESOPHAGEAL DILATION WITH 54FR MALONEY DILATOR;  Surgeon: Corbin Ade, MD;  Location: [facility];  Service: Endoscopy;  Laterality: N/A;  . ESOPHAGOGASTRODUODENOSCOPY    . ESOPHAGOGASTRODUODENOSCOPY (EGD) WITH PROPOFOL N/A 04/10/2015   Procedure: ESOPHAGOGASTRODUODENOSCOPY (EGD) WITH PROPOFOL;  Surgeon: Corbin Ade, MD;  Location: [facility];  Service: Endoscopy;  Laterality: N/A;  . ESOPHAGOGASTRODUODENOSCOPY (EGD) WITH PROPOFOL N/A 12/06/2016   Procedure: ESOPHAGOGASTRODUODENOSCOPY (EGD) WITH PROPOFOL;  Surgeon: West Bali, MD;  [facility];  Service: Endoscopy;  Laterality: N/A;  . ESOPHAGOGASTRODUODENOSCOPY (EGD) WITH PROPOFOL N/A 04/27/2019   Procedure: ESOPHAGOGASTRODUODENOSCOPY (EGD) WITH PROPOFOL;  Surgeon: Malissa Hippo, MD;  [facility];  Service: Endoscopy;  Laterality: N/A;  730  . ESOPHAGOGASTRODUODENOSCOPY (EGD) WITH PROPOFOL N/A 08/31/2019   Procedure: ESOPHAGOGASTRODUODENOSCOPY (EGD) WITH PROPOFOL;  Surgeon: Malissa Hippo, MD;  [facility];  Service: Endoscopy;  Laterality: N/A;  . GAB  2007   in [place]-POUCH 5 CM  . GASTRIC BYPASS  06/2006  . HYSTEROSCOPY WITH D & C N/A 09/12/2014   Procedure: DILATATION AND CURETTAGE /HYSTEROSCOPY;  Surgeon: Tilda Burrow, MD;  Location: [facility];  Service: Gynecology;  Laterality: N/A;  . KYPHOPLASTY N/A 08/02/2018   Procedure: KYPHOPLASTY T12;  Surgeon: Venita Lick, MD;  Location: [facility];  Service: Orthopedics;  Laterality: N/A;  60 mins  . LAPAROSCOPIC TUBAL LIGATION Bilateral 03/24/2017   Procedure: LAPAROSCOPIC TUBAL LIGATION ([place] Rings);  Surgeon: Tilda Burrow, MD;  Location: [facility];  Service: Gynecology;  Laterality: Bilateral;  . REPAIR VAGINAL CUFF N/A 07/30/2014   Procedure: REPAIR VAGINAL CUFF;  Surgeon: Catalina Antigua, MD;  Location: [facility];  Service: Gynecology;  Laterality: N/A;  . SAVORY DILATION  06/20/2012   Dr. Cyndi Bender gastritis/Ulcer in the mid jejunum. Empiric dilation.   . SURAL NERVE BX Left 02/25/2016   Procedure: LEFT SURAL NERVE BIOPSY;  Surgeon: Shirlean Kelly, MD;  [facility];  Service: Neurosurgery;  Laterality: Left;  Left sural nerve biopsy  . TONSILLECTOMY    . TONSILLECTOMY AND ADENOIDECTOMY    . UPPER GASTROINTESTINAL ENDOSCOPY  JULY 2011 NAUSEA-D125,V6, PH 25   Bx; GASTRITIS, POUCH-5 CM LONG  . WISDOM TOOTH EXTRACTION      Current Medications: Outpatient Medications Prior to Visit  Medication Sig Dispense Refill  . AIMOVIG 70 MG/ML SOAJ SMARTSIG:70  Milligram(s) SUB-Q Every 4 Weeks    . amphetamine-dextroamphetamine (ADDERALL XR) 20 MG 24 hr capsule Take 1 capsule (20 mg total) by mouth every morning. 30 capsule 0  . BELBUCA 150 MCG FILM Take 1 strip by mouth 2 (two) times daily.    . budesonide-formoterol (SYMBICORT) 160-4.5 MCG/ACT inhaler Inhale 2 puffs into the lungs 2 (two) times daily. 1 Inhaler 5  . busPIRone (BUSPAR) 10 MG tablet Take 1 tablet (10 mg total) by mouth 3 (three) times daily. 90 tablet 2  . cetirizine (ZYRTEC) 10 MG tablet Take 10 mg by mouth daily.    . clonazePAM (KLONOPIN) 0.5 MG tablet Take 1 tablet (0.5 mg total) by mouth 2 (two) times daily. 60 tablet 2  . DULoxetine (CYMBALTA) 60 MG capsule Take 1 capsule (60 mg total) by mouth 2 (two) times daily. 180 capsule 2  .  estazolam (PROSOM) 2 MG tablet Take 1 tablet (2 mg total) by mouth at bedtime. 30 tablet 0  . HYDROmorphone (DILAUDID) 2 MG tablet Take 2 tablets (4 mg total) by mouth every 12 (twelve) hours as needed for severe pain. Take 1-2 tablets po q 4 hrs prn pain no greater than 9 tablets/day 1 tablet 0  . ondansetron (ZOFRAN-ODT) 8 MG disintegrating tablet Take 1 tablet (8 mg total) by mouth every 8 (eight) hours as needed. 20 tablet 3  . pantoprazole (PROTONIX) 40 MG tablet TAKE 1 TABLET BY MOUTH TWICE DAILY BEFORE MEALS. (Patient taking differently: Take 40 mg by mouth 2 (two) times daily. ) 60 tablet 3  . promethazine (PHENERGAN) 25 MG tablet 1 bid prn 30 tablet 2  . rOPINIRole (REQUIP) 0.5 MG tablet Take 1 tablet (0.5 mg total) by mouth at bedtime. 30 tablet 3  . traZODone (DESYREL) 100 MG tablet Take 2 tablets (200 mg total) by mouth at bedtime. 60 tablet 2  . TRULANCE 3 MG TABS TAKE ONE TABLET BY MOUTH ONCE DAILY. 90 tablet 1  . valACYclovir (VALTREX) 1000 MG tablet TAKE 1 TABLET BY MOUTH DAILY. 90 tablet PRN  . zolpidem (AMBIEN) 10 MG tablet Take 1 tablet (10 mg total) by mouth at bedtime. 30 tablet 2   No facility-administered medications prior to  visit.     Allergies:   Gabapentin, Metoclopramide hcl, Other, Tramadol, Ativan [lorazepam], Latex, Lyrica [pregabalin], Trazodone and nefazodone, Zofran [ondansetron], Iron, Sulfa antibiotics, Butrans [buprenorphine], Nucynta [tapentadol], Propofol, Tape, and Toradol [ketorolac tromethamine]   Social History   Socioeconomic History  . Marital status: Divorced    Spouse name: Not on file  . Number of children: 2  . Years of education: some college  . Highest education level: Some college, no degree  Occupational History  . Occupation: Applying for [employer]: NOT EMPLOYED  Tobacco Use  . Smoking status: Former Smoker    Packs/day: 0.25    Years: 6.00    Pack years: 1.50    Types: Cigarettes    Start date: 05/2018  . Smokeless tobacco: Never Used  Vaping Use  . Vaping Use: Former  Substance and Sexual Activity  . Alcohol use: Not Currently    Alcohol/week: 0.0 standard drinks  . Drug use: Not Currently  . Sexual activity: Yes    Partners: Male    Birth control/protection: Surgical, Condom    Comment: tubal and ablation  Other Topics Concern  . Not on file  Social History Narrative   Patient is right handed.   Patient drinks one cup of coffee daily.   She lives in a one-story home with her two children (1 year old son, 83 month old daughter).   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 Difficulty of Paying Living Expenses: Very hard  Food Insecurity: No Food Insecurity  . Worried About Charity fundraiser in the Last Year: Never true  . Ran Out of Food in the Last Year: Never true  Transportation Needs: No Transportation Needs  . Lack of Transportation (Medical): No  . Lack of Transportation (Non-Medical): No  Physical Activity: Inactive  . Days of Exercise per Week: 0 days  . Minutes of Exercise per Session: 0 min   Stress: Stress Concern Present  . Feeling of Stress : Very  much  Social Connections: Moderately Isolated  . Frequency of Communication with Friends and Family: More than three times a week  . Frequency of Social Gatherings with Friends and Family: More than three times a week  . Attends Religious Services: More than 4 times per year  . Active Member of Clubs or Organizations: No  . Attends Archivist Meetings: Never  . Marital Status: Divorced     Family History:  The patient's ***family history includes Anxiety disorder in her brother and mother; Bipolar disorder in her brother; Breast cancer in her maternal grandmother, mother, and paternal grandmother; Cancer in her maternal grandmother and mother; Coronary artery disease in her father and paternal grandfather; Depression in her mother; Diabetes in her father; Healthy in her daughter and son; Hemochromatosis in her maternal grandmother and mother; Hypertension in her father; Migraines in her maternal grandmother and paternal grandmother.   Review of Systems:   Please see the history of present illness.     General:  No chills, fever, night sweats or weight changes.  Cardiovascular:  No chest pain, dyspnea on exertion, edema, orthopnea, palpitations, paroxysmal nocturnal dyspnea. Dermatological: No rash, lesions/masses Respiratory: No cough, dyspnea Urologic: No hematuria, dysuria Abdominal:   No nausea, vomiting, diarrhea, bright red blood per rectum, melena, or hematemesis Neurologic:  No visual changes, wkns, changes in mental status. All other systems reviewed and are otherwise negative except as noted above.   Physical Exam:    VS:  There were no vitals taken for this visit.   General: Well developed, well nourished,female appearing in no acute distress. Head: Normocephalic, atraumatic. Neck: No carotid bruits. JVD not elevated.  Lungs: Respirations regular and unlabored, without wheezes or rales.  Heart:  ***Regular rate and rhythm. No S3 or S4.  No murmur, no rubs, or gallops appreciated. Abdomen: Appears non-distended. No obvious abdominal masses. Msk:  Strength and tone appear normal for age. No obvious joint deformities or effusions. Extremities: No clubbing or cyanosis. No edema.  Distal pedal pulses are 2+ bilaterally. Neuro: Alert and oriented X 3. Moves all extremities spontaneously. No focal deficits noted. Psych:  Responds to questions appropriately with a normal affect. Skin: No rashes or lesions noted  Wt Readings from Last 3 Encounters:  10/09/20 164 lb 6.4 oz (74.6 kg)  08/26/20 175 lb (79.4 kg)  08/20/20 170 lb (77.1 kg)        Studies/Labs Reviewed:   EKG:  EKG is*** ordered today.  The ekg ordered today demonstrates ***  Recent Labs: 06/23/2020: ALT 39 08/20/2020: BUN 9; Creatinine, Ser 0.65; Hemoglobin 13.6; Platelets 331; Potassium 3.0; Sodium 136   Lipid Panel    Component Value Date/Time   CHOL 95 03/10/2012 0642   TRIG 50 03/10/2012 0642   HDL 38 (L) 03/10/2012 0642   CHOLHDL 2.5 03/10/2012 0642   VLDL 10 03/10/2012 0642   LDLCALC 47 03/10/2012 0642    Additional studies/ records that were reviewed today include:   Echocardiogram: 08/2019 IMPRESSIONS    1. Left ventricular ejection fraction, by visual estimation, is 50 to  55%. The left ventricle has normal function. Normal left ventricular size.  There is no left ventricular hypertrophy.  2. Global right ventricle has normal systolic function.The right  ventricular size is normal. No increase in right ventricular wall  thickness.  3. Left atrial size was normal.  4. Right atrial size was normal.  5. The mitral valve is grossly normal with mild thickening. No evidence  of mitral valve  regurgitation.  6. The tricuspid valve is grossly normal. Tricuspid valve regurgitation  is trivial.  7. The aortic valve is tricuspid Aortic valve regurgitation was not  visualized by color flow Doppler.   8. The pulmonic valve was not well visualized. Pulmonic valve  regurgitation was not assessed by color flow Doppler.  9. TR signal is inadequate for assessing pulmonary artery systolic  pressure.  10. The inferior vena cava is normal in size with greater than 50%  respiratory variability, suggesting right atrial pressure of 3 mmHg.  11. Patient reported to have history of ostium secundum ASD, but this is  not clearly evident by the present study.   Event Monitor: 09/2020 NSR range 35 to 184 bpm average rate 84 bpm One episode PSVT only 11 beats PAC;s < 1% total beats PvC;s < 1% total beats Symptoms consistently did not correlate with any arrhythmia    Assessment:    No diagnosis found.   Plan:   In order of problems listed above:  1. ***    Shared Decision Making/Informed Consent:   {Are you ordering a CV Procedure (e.g. stress test, cath, DCCV, TEE, etc)?   Press F2        :[id number]}    Medication Adjustments/Labs and Tests Ordered: Current medicines are reviewed at length with the patient today.  Concerns regarding medicines are outlined above.  Medication changes, Labs and Tests ordered today are listed in the Patient Instructions below. There are no Patient Instructions on file for this visit.   Signed, Erma Heritage, PA-C  10/26/2020 2:31 PM    [facility] [address] [place], [place] [address] Phone: [phone] Fax: [phone]

## 2020-10-27 ENCOUNTER — Ambulatory Visit: Payer: Medicaid Other | Admitting: Student

## 2020-10-27 MED ORDER — ROPINIROLE HCL 1 MG PO TABS
ORAL_TABLET | ORAL | 5 refills | Status: DC
Start: 2020-10-27 — End: 2021-08-28

## 2020-10-27 NOTE — Addendum Note (Signed)
Addended by: Vicente Males on: 10/27/2020 09:32 AM   Modules accepted: Orders

## 2020-10-27 NOTE — Telephone Encounter (Signed)
Nurses 1.  Restless leg medicine will help minimize his symptoms of restless leg but will not take them away totally.  But we can go ahead and increase the dose of the Requip to 1 mg each evening, #30 with 5 refills  #2 as for the referral-this was sent in early November it is not unusual for her many of the specialist office to take several weeks before they reach out to set up an appointment.  I wish I could change that but it is what it is. I would state to the patient that if she has not heard anything by the end of November then notify our staff and at that time the message can be forwarded directly to referrals regarding this question thank you

## 2020-11-14 ENCOUNTER — Other Ambulatory Visit (HOSPITAL_COMMUNITY): Payer: Self-pay | Admitting: Psychiatry

## 2020-11-17 ENCOUNTER — Telehealth (INDEPENDENT_AMBULATORY_CARE_PROVIDER_SITE_OTHER): Payer: Medicaid Other | Admitting: Psychiatry

## 2020-11-17 ENCOUNTER — Other Ambulatory Visit: Payer: Self-pay

## 2020-11-17 ENCOUNTER — Encounter (HOSPITAL_COMMUNITY): Payer: Self-pay | Admitting: Psychiatry

## 2020-11-17 DIAGNOSIS — F900 Attention-deficit hyperactivity disorder, predominantly inattentive type: Secondary | ICD-10-CM | POA: Diagnosis not present

## 2020-11-17 DIAGNOSIS — F332 Major depressive disorder, recurrent severe without psychotic features: Secondary | ICD-10-CM | POA: Diagnosis not present

## 2020-11-17 DIAGNOSIS — F411 Generalized anxiety disorder: Secondary | ICD-10-CM | POA: Diagnosis not present

## 2020-11-17 MED ORDER — AMPHETAMINE-DEXTROAMPHET ER 20 MG PO CP24
20.0000 mg | ORAL_CAPSULE | ORAL | 0 refills | Status: DC
Start: 2020-11-17 — End: 2021-01-02

## 2020-11-17 MED ORDER — AMPHETAMINE-DEXTROAMPHET ER 20 MG PO CP24: 20.0000 mg | ORAL_CAPSULE | ORAL | 0 refills | Status: DC

## 2020-11-17 MED ORDER — DULOXETINE HCL 60 MG PO CPEP: 60.0000 mg | ORAL_CAPSULE | Freq: Two times a day (BID) | ORAL | 2 refills | Status: DC

## 2020-11-17 MED ORDER — TRAZODONE HCL 100 MG PO TABS
200.0000 mg | ORAL_TABLET | Freq: Every day | ORAL | 2 refills | Status: DC
Start: 2020-11-17 — End: 2021-01-02

## 2020-11-17 MED ORDER — BUSPIRONE HCL 10 MG PO TABS
10.0000 mg | ORAL_TABLET | Freq: Three times a day (TID) | ORAL | 2 refills | Status: DC
Start: 2020-11-17 — End: 2021-01-02

## 2020-11-17 MED ORDER — CLONAZEPAM 0.5 MG PO TABS: 0.5000 mg | ORAL_TABLET | Freq: Two times a day (BID) | ORAL | 2 refills | Status: DC

## 2020-11-17 NOTE — Progress Notes (Signed)
Virtual Visit via Video Note  I connected with Penny [name] on 11/17/20 at  9:40 AM EST by a video enabled telemedicine application and verified that I am speaking with the correct person using two identifiers.  Location: Patient: home Provider: office   I discussed the limitations of evaluation and management by telemedicine and the availability of in person appointments. The patient expressed understanding and agreed to proceed.    I discussed the assessment and treatment plan with the patient. The patient was provided an opportunity to ask questions and all were answered. The patient agreed with the plan and demonstrated an understanding of the instructions.   The patient was advised to call back or seek an in-person evaluation if the symptoms worsen or if the condition fails to improve as anticipated.  I provided 15 minutes of non-face-to-face time during this encounter.   [name], MD  [facility] MD/PA/NP OP Progress Note  11/17/2020 9:55 AM Penny [name]  MRN:  [id number]  Chief Complaint:  Chief Complaint    Anxiety; Depression; ADD     HPI: This patient is a 34 year old female who is now living alone in [place].  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  She has 2 children with whom she shares custody with their fathers.  The patient returns for follow-up after 6 weeks.  She states that she is doing better now.  She and her boyfriend have moved the part because they get along better and her children get along better when they do not live together.  She just got herself a new apartment and she is happy with it.  Last time we increased her BuSpar and she states her anxiety is better.  Her mood is stable.  She is sleeping variably and sometimes has restless legs.  She is focusing well with the Adderall XR.  She denies serious depression panic attacks or suicidal ideation.  She is still having chronic back pain but seems to be able to cope with it.  She  is staying busy with her children's activities Visit Diagnosis:    ICD-10-CM   1. GAD (generalized anxiety disorder)  F41.1   2. Major depressive disorder, recurrent episode, 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[place])  F33.2   3. Attention deficit hyperactivity disorder (ADHD), predominantly inattentive type  F90.0     Past Psychiatric History: Hospitalized 5 months ago for depression  Past Medical History:  Past Medical History:  Diagnosis Date  . ADD (attention deficit disorder)   . Anemia 2011   2o to GASTRIC BYPASS  . Anginal pain ([place])   . Anxiety   . Asthma   . Blood transfusion without reported diagnosis   . Chronic daily headache   . Depression   . Depression   . Dysmenorrhea 09/18/2013  . Dysrhythmia   . Elevated liver enzymes JUL 2011ALK PHOS 111-127 AST  143-267 ALT  213-321T BILI 0.6  ALB  3.7-4.06 Jun 2011 ALK PHOS 118 AST 24 ALT 42 T BILI 0.4 ALB 3.9  . Encounter for drug rehabilitation    at behavioral health for opioid addiction about 4 years ago  . Family history of adverse reaction to anesthesia    'dad had to be kept on pump for breathing for morphine'  . Fatty liver   . Fibroid 01/18/2017  . Fibromyalgia   . Gastric bypass status for obesity   . Gastritis JULY 2011  . Heart murmur   . Hereditary and  idiopathic peripheral neuropathy 01/15/2015  . History of Holter monitoring   . Hx of opioid abuse ([place])    for about 4 years, about 4 years ago  . Interstitial cystitis   . Iron deficiency anemia 07/23/2010  . Irritable bowel syndrome 2012 DIARRHEA   JUN 2012 TTG IgA 14.9  . IUD FEB 2010  . Lupus ([place])   . Menorrhagia 07/18/2013  . Migraines   . Obesity (BMI 30-39.9) 2011 228 LBS BMI 36.8  . Ovarian cyst   . Patient desires pregnancy 09/18/2013  . Polyneuropathy   . PONV (postoperative nausea and vomiting) seizure post-operatively   seizure following ankle surgery  . Potassium (K) deficiency   . Pregnant 12/25/2013  . Psychiatric pseudoseizure   . RLQ  abdominal pain 07/18/2013  . Sciatica of left side 11/14/2014  . 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 Stress 09/18/2013    Past Surgical History:  Procedure Laterality Date  . BIOPSY  04/10/2015   Procedure: BIOPSY;  Surgeon: Daneil Dolin, MD;  Location: [facility];  Service: Endoscopy;;  . cath self every nite     for sodium bicarb injection (discontinued 2013)  . CHOLECYSTECTOMY  2005   biliary dyskinesia  . COLONOSCOPY  JUN 2012 ABD PN/DIARRHEA WITH PROPOFOL   NL COLON  . DILATION AND CURETTAGE OF UTERUS    . DILITATION & CURRETTAGE/HYSTROSCOPY WITH NOVASURE ABLATION N/A 03/24/2017   Procedure: DILATATION & CURETTAGE/HYSTEROSCOPY WITH NOVASURE ENDOMETRIAL ABLATION;  Surgeon: Jonnie Kind, MD;  Location: [facility];  Service: Gynecology;  Laterality: N/A;  . ESOPHAGEAL DILATION N/A 04/10/2015   Procedure: ESOPHAGEAL DILATION WITH 54FR MALONEY DILATOR;  Surgeon: Daneil Dolin, MD;  Location: [facility];  Service: Endoscopy;  Laterality: N/A;  . ESOPHAGOGASTRODUODENOSCOPY    . ESOPHAGOGASTRODUODENOSCOPY (EGD) WITH PROPOFOL N/A 04/10/2015   Procedure: ESOPHAGOGASTRODUODENOSCOPY (EGD) WITH PROPOFOL;  Surgeon: Daneil Dolin, MD;  Location: [facility];  Service: Endoscopy;  Laterality: N/A;  . ESOPHAGOGASTRODUODENOSCOPY (EGD) WITH PROPOFOL N/A 12/06/2016   Procedure: ESOPHAGOGASTRODUODENOSCOPY (EGD) WITH PROPOFOL;  Surgeon: Danie Binder, MD;  [facility];  Service: Endoscopy;  Laterality: N/A;  . ESOPHAGOGASTRODUODENOSCOPY (EGD) WITH PROPOFOL N/A 04/27/2019   Procedure: ESOPHAGOGASTRODUODENOSCOPY (EGD) WITH PROPOFOL;  Surgeon: Rogene Houston, MD;  [facility];  Service: Endoscopy;  Laterality: N/A;  730  . ESOPHAGOGASTRODUODENOSCOPY (EGD) WITH PROPOFOL N/A 08/31/2019   Procedure: ESOPHAGOGASTRODUODENOSCOPY (EGD) WITH PROPOFOL;  Surgeon: Rogene Houston, MD;  [facility];  Service: Endoscopy;  Laterality: N/A;  . GAB  2007   in [place]-POUCH 5 CM  . GASTRIC  BYPASS  06/2006  . HYSTEROSCOPY WITH D & C N/A 09/12/2014   Procedure: DILATATION AND CURETTAGE /HYSTEROSCOPY;  Surgeon: Jonnie Kind, MD;  Location: [facility];  Service: Gynecology;  Laterality: N/A;  . KYPHOPLASTY N/A 08/02/2018   Procedure: KYPHOPLASTY T12;  Surgeon: Melina Schools, MD;  Location: [facility];  Service: Orthopedics;  Laterality: N/A;  60 mins  . LAPAROSCOPIC TUBAL LIGATION Bilateral 03/24/2017   Procedure: LAPAROSCOPIC TUBAL LIGATION ([place] Rings);  Surgeon: Jonnie Kind, MD;  Location: [facility];  Service: Gynecology;  Laterality: Bilateral;  . REPAIR VAGINAL CUFF N/A 07/30/2014   Procedure: REPAIR VAGINAL CUFF;  Surgeon: Mora Bellman, MD;  Location: [facility];  Service: Gynecology;  Laterality: N/A;  . SAVORY DILATION  06/20/2012   Dr. Barnie Alderman gastritis/Ulcer in the mid jejunum. Empiric dilation.   . SURAL NERVE BX Left 02/25/2016   Procedure:  LEFT SURAL NERVE BIOPSY;  Surgeon: Jovita Gamma, MD;  Location: [facility];  Service: Neurosurgery;  Laterality: Left;  Left sural nerve biopsy  . TONSILLECTOMY    . TONSILLECTOMY AND ADENOIDECTOMY    . UPPER GASTROINTESTINAL ENDOSCOPY  JULY 2011 NAUSEA-D125,V6, PH 25   Bx; GASTRITIS, POUCH-5 CM LONG  . WISDOM TOOTH EXTRACTION      Family Psychiatric History: see below  Family History:  Family History  Problem Relation Age of Onset  . Hemochromatosis Maternal Grandmother   . Migraines Maternal Grandmother   . Cancer Maternal Grandmother   . Breast cancer Maternal Grandmother   . Hypertension Father   . Diabetes Father   . Coronary artery disease Father   . Migraines Paternal Grandmother   . Breast cancer Paternal Grandmother   . Cancer Mother        breast  . Hemochromatosis Mother   . Breast cancer Mother   . Depression Mother   . Anxiety disorder Mother   . Coronary artery disease Paternal Grandfather   . Anxiety disorder Brother   . Bipolar disorder Brother   . Healthy Daughter   . Healthy Son     Social  History:  Social History   Socioeconomic History  . Marital status: Divorced    Spouse name: Not on file  . Number of children: 2  . Years of education: some college  . Highest education level: Some college, no degree  Occupational History  . Occupation: Applying for [employer]: NOT EMPLOYED  Tobacco Use  . Smoking status: Former Smoker    Packs/day: 0.25    Years: 6.00    Pack years: 1.50    Types: Cigarettes    Start date: 05/2018  . Smokeless tobacco: Never Used  Vaping Use  . Vaping Use: Former  Substance and Sexual Activity  . Alcohol use: Not Currently    Alcohol/week: 0.0 standard drinks  . Drug use: Not Currently  . Sexual activity: Yes    Partners: Male    Birth control/protection: Surgical, Condom    Comment: tubal and ablation  Other Topics Concern  . Not on file  Social History Narrative   Patient is right handed.   Patient drinks one cup of coffee daily.   She lives in a one-story home with her two children (55 year old son, 49 month old daughter).   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 Difficulty of Paying Living Expenses: Very hard  Food Insecurity: No Food Insecurity  . Worried About Charity fundraiser in the Last Year: Never true  . Ran Out of Food in the Last Year: Never true  Transportation Needs: No Transportation Needs  . Lack of Transportation (Medical): No  . Lack of Transportation (Non-Medical): No  Physical Activity: Inactive  . Days of Exercise per Week: 0 days  . Minutes of Exercise per Session: 0 min  Stress: Stress Concern Present  . Feeling of Stress : Very much  Social Connections: Moderately Isolated  . Frequency of Communication with Friends and Family: More than three times a week  . Frequency of Social Gatherings with Friends and Family: More than three times a week  . Attends  Religious Services: More than 4 times per year  . Active Member of Clubs or Organizations: No  .  Attends Archivist Meetings: Never  . Marital Status: Divorced    Allergies:  Allergies  Allergen Reactions  . Gabapentin Other (See Comments)    Pt states that she was unresponsive after taking this medication, but her vitals remained stable.    . Metoclopramide Hcl Anxiety and Other (See Comments)    Pt states that she felt like she was trapped in a box, and could not get out.  Pt also states that she had temporary loss of movement, weakness, and tingling.    . Other Itching, Rash and Hives    Please use paper tape Other reaction(s): Other (See Comments) Please use paper tape  . Tramadol Other (See Comments)    Seizures  . Ativan [Lorazepam] Other (See Comments)    combative  . Latex Itching, Rash and Other (See Comments)    Burns  . Lyrica [Pregabalin] Other (See Comments)    suicidal  . Trazodone And Nefazodone Other (See Comments)    Makes pt "like a zombie"  . Zofran [Ondansetron] Other (See Comments)    Migraines in high doses    . Iron Other (See Comments)    PATIENT IS INTOLERANT TO ORAL IRON , SHE DOES NOT ABSORB IT.    Marland Kitchen Sulfa Antibiotics Hives  . Butrans [Buprenorphine] Rash    Patch caused rash, didn't help with pain  . Nucynta [Tapentadol] Nausea And Vomiting  . Propofol Nausea And Vomiting  . Tape Itching, Rash and Other (See Comments)    Please use paper tape  . Toradol [Ketorolac Tromethamine] Anxiety    Metabolic Disorder Labs: Lab Results  Component Value Date   HGBA1C 5.7 (H) 01/15/2015   Lab Results  Component Value Date   PROLACTIN 80.0 (H) 05/09/2015   Lab Results  Component Value Date   CHOL 95 03/10/2012   TRIG 50 03/10/2012   HDL 38 (L) 03/10/2012   CHOLHDL 2.5 03/10/2012   VLDL 10 03/10/2012   LDLCALC 47 03/10/2012   Lab Results  Component Value Date   TSH 2.616 08/25/2019   TSH 1.592 05/25/2019    Therapeutic Level  Labs: No results found for: LITHIUM Lab Results  Component Value Date   VALPROATE 66 06/23/2020   No components found for:  CBMZ  Current Medications: Current Outpatient Medications  Medication Sig Dispense Refill  . AIMOVIG 70 MG/ML SOAJ SMARTSIG:70 Milligram(s) SUB-Q Every 4 Weeks    . amphetamine-dextroamphetamine (ADDERALL XR) 20 MG 24 hr capsule Take 1 capsule (20 mg total) by mouth every morning. 30 capsule 0  . amphetamine-dextroamphetamine (ADDERALL XR) 20 MG 24 hr capsule Take 1 capsule (20 mg total) by mouth every morning. 30 capsule 0  . BELBUCA 150 MCG FILM Take 1 strip by mouth 2 (two) times daily.    . budesonide-formoterol (SYMBICORT) 160-4.5 MCG/ACT inhaler Inhale 2 puffs into the lungs 2 (two) times daily. 1 Inhaler 5  . busPIRone (BUSPAR) 10 MG tablet Take 1 tablet (10 mg total) by mouth 3 (three) times daily. 90 tablet 2  . cetirizine (ZYRTEC) 10 MG tablet Take 10 mg by mouth daily.    . clonazePAM (KLONOPIN) 0.5 MG tablet Take 1 tablet (0.5 mg total) by mouth 2 (two) times daily. 60 tablet 2  . DULoxetine (CYMBALTA) 60 MG capsule Take 1 capsule (60 mg total) by mouth 2 (two) times daily. 180 capsule 2  . HYDROmorphone (DILAUDID) 2 MG tablet Take 2 tablets (4 mg total) by mouth every 12 (twelve) hours as needed for  severe pain. Take 1-2 tablets po q 4 hrs prn pain no greater than 9 tablets/day 1 tablet 0  . ondansetron (ZOFRAN-ODT) 8 MG disintegrating tablet Take 1 tablet (8 mg total) by mouth every 8 (eight) hours as needed. 20 tablet 3  . pantoprazole (PROTONIX) 40 MG tablet TAKE 1 TABLET BY MOUTH TWICE DAILY BEFORE MEALS. (Patient taking differently: Take 40 mg by mouth 2 (two) times daily. ) 60 tablet 3  . promethazine (PHENERGAN) 25 MG tablet 1 bid prn 30 tablet 2  . rOPINIRole (REQUIP) 1 MG tablet Take one tablet po each evening. 30 tablet 5  . traZODone (DESYREL) 100 MG tablet Take 2 tablets (200 mg total) by mouth at bedtime. 60 tablet 2  . TRULANCE 3 MG TABS TAKE  ONE TABLET BY MOUTH ONCE DAILY. 90 tablet 1  . valACYclovir (VALTREX) 1000 MG tablet TAKE 1 TABLET BY MOUTH DAILY. 90 tablet PRN   No current facility-administered medications for this visit.     Musculoskeletal: Strength & Muscle Tone: within normal limits Gait & Station: normal Patient leans: N/A  Psychiatric Specialty Exam: Review of Systems  Musculoskeletal: Positive for back pain.  All other systems reviewed and are negative.   There were no vitals taken for this visit.There is no height or weight on file to calculate BMI.  General Appearance: Casual and Fairly Groomed  Eye Contact:  Good  Speech:  Clear and Coherent  Volume:  Normal  Mood:  Euthymic  Affect:  Appropriate and Congruent  Thought Process:  Goal Directed  Orientation:  Full (Time, Place, and Person)  Thought Content: WDL   Suicidal Thoughts:  No  Homicidal Thoughts:  No  Memory:  Immediate;   Good Recent;   Good Remote;   Good  Judgement:  Fair  Insight:  Fair  Psychomotor Activity:  Normal  Concentration:  Concentration: Good and Attention Span: Good  Recall:  Good  Fund of Knowledge: Good  Language: Good  Akathisia:  No  Handed:  Right  AIMS (if indicated): not done  Assets:  Communication Skills Desire for Improvement Resilience Social Support Talents/Skills  ADL's:  Intact  Cognition: WNL  Sleep:  Fair   Screenings: AIMS   Flowsheet Row Admission (Discharged) from 06/19/2020 in [facility]  AIMS Total Score 0    AUDIT   Flowsheet Row Patient Outreach Telephone from 07/14/2020 in [facility] Admission (Discharged) from 06/19/2020 in [facility] ED to Hosp-Admission (Discharged) from 05/09/2015 in [facility]  Alcohol Use Disorder Identification Test Final Score (AUDIT) 1 1 0    GAD-7   Flowsheet Row Counselor from 12/11/2018 in [facility] Counselor from 02/07/2017 in [facility]  Total GAD-7 Score 19 17    PHQ2-9   [facility] Patient Outreach Telephone from 07/14/2020 in [facility] ED from 06/17/2020 in [facility] Counselor from 12/11/2018 in [facility] Counselor from 07/22/2017 in [facility] Counselor from 02/07/2017 in [facility]  PHQ-2 Total Score $RemoveBef'4 4 6 3 2  'kwHIEubIKb$ PHQ-9 Total Score $RemoveBef'19 22 22 15 14       'vgRJicAhws$ Assessment and Plan: This patient is a 34 year old female with a history of depression anxiety ADHD and borderline personality traits and insomnia.  She is in a better place than she was last month  after she recovered from coronavirus and she is moved out from her fianc.  We will continue her current medications-Adderall XR 20 mg every morning for focus, Cymbalta 120 mg daily for depression, BuSpar 10 mg 3 times daily for anxiety, clonazepam 0.5 mg up to twice daily for anxiety and trazodone 200 mg at bedtime for sleep.  She will return to see me in 2 months.  [name], MD 11/17/2020, 9:55 AM

## 2020-11-24 DIAGNOSIS — Z79899 Other long term (current) drug therapy: Secondary | ICD-10-CM | POA: Diagnosis not present

## 2020-11-24 DIAGNOSIS — R000 Tachycardia, unspecified: Secondary | ICD-10-CM | POA: Diagnosis not present

## 2020-11-24 DIAGNOSIS — M546 Pain in thoracic spine: Secondary | ICD-10-CM | POA: Diagnosis not present

## 2020-12-02 ENCOUNTER — Telehealth (INDEPENDENT_AMBULATORY_CARE_PROVIDER_SITE_OTHER): Payer: Self-pay | Admitting: *Deleted

## 2020-12-02 ENCOUNTER — Telehealth (HOSPITAL_COMMUNITY): Payer: Self-pay

## 2020-12-02 NOTE — Telephone Encounter (Signed)
Forward to Dr.Rehman for review and recommendation.

## 2020-12-02 NOTE — Telephone Encounter (Signed)
I can give her a call I am not sure if she is looking to be the patient and also have her son get into therapy or not but I am assuming she would be the identified patient. What I can do is give her a call today and if she decides to proceed get her to schedule for an assessment.  Thanks Suzan Garibaldi

## 2020-12-02 NOTE — Telephone Encounter (Signed)
Advice and appointment - Pt called and would like to speak to Suzan Garibaldi, LCSW about coming back in to see him for therapy but would also like to have her son present.  Pt. requests to speak to counselor before making an appt.  Agreed to send request.

## 2020-12-02 NOTE — Telephone Encounter (Signed)
Since we do not have a provider in the office this week I would recommend follow-up with Dr. [name] and if he cannot see her then she should go to emergency room.

## 2020-12-02 NOTE — Telephone Encounter (Signed)
Patient left a voicemail that she has been unable to eat for several days, can't keep anything down , throwing it up. Has not had a office visit here since 2020. She is asking what would Dr.Rehman recommend. Her call back number is [phone].  To be addressed with Dr.Rehman.

## 2020-12-03 NOTE — Telephone Encounter (Signed)
Patient aware of all. She will call PCP.

## 2020-12-04 ENCOUNTER — Telehealth (INDEPENDENT_AMBULATORY_CARE_PROVIDER_SITE_OTHER): Payer: Medicaid Other | Admitting: Family Medicine

## 2020-12-04 ENCOUNTER — Encounter: Payer: Self-pay | Admitting: Family Medicine

## 2020-12-04 ENCOUNTER — Other Ambulatory Visit: Payer: Self-pay

## 2020-12-04 DIAGNOSIS — K5909 Other constipation: Secondary | ICD-10-CM | POA: Diagnosis not present

## 2020-12-04 DIAGNOSIS — R110 Nausea: Secondary | ICD-10-CM | POA: Diagnosis not present

## 2020-12-04 MED ORDER — ONDANSETRON 8 MG PO TBDP
8.0000 mg | ORAL_TABLET | Freq: Three times a day (TID) | ORAL | 3 refills | Status: DC | PRN
Start: 2020-12-04 — End: 2021-03-26

## 2020-12-04 NOTE — Telephone Encounter (Signed)
Pt contacted. Pt placed on Dr.Scott schedule.

## 2020-12-04 NOTE — Progress Notes (Signed)
Phone conversation with patient Virtual Visit via Video Note  I connected with Penny [name] on 12/04/20 at 11:50 AM EST by a video enabled telemedicine application and verified that I am speaking with the correct person using two identifiers.  Location: Patient: Home Provider: Office   I discussed the limitations of evaluation and management by telemedicine and the availability of in person appointments. The patient expressed understanding and agreed to proceed.  History of Present Illness:    Observations/Objective:   Assessment and Plan:   Follow Up Instructions:    I discussed the assessment and treatment plan with the patient. The patient was provided an opportunity to ask questions and all were answered. The patient agreed with the plan and demonstrated an understanding of the instructions.   The patient was advised to call back or seek an in-person evaluation if the symptoms worsen or if the condition fails to improve as anticipated.  I provided 25 including chart review and documentation minutes of non-face-to-face time during this encounter.   Penny Punt, MD  This was a phone visit virtual visit via video was not possible Patient having extreme nausea intermittent over the past several days Zofran does help she sometimes takes Phenergan but we talked about how that could cause mental depression and sedation so she is to avoid that as much as possible we also talked about her bowel movements currently she is using a small amount of MiraLAX a capful per day but it causes nausea we talked about splitting it up over several glasses of water throughout the day patient denies any bloody stools if she has progressive nausea and feeling worse I think further evaluation with her gastroenterologist would be helpful hold off on any type of gastric emptying study currently call us if any problems

## 2020-12-04 NOTE — Telephone Encounter (Signed)
Nurses I am willing to do a phone visit or virtual visit which ever the patient prefers during the lunch hour Please explain to Penny [name] currently right now we are essentially booked up for the rest of the day but I can call her during the lunch hour please put her on my schedule

## 2020-12-06 ENCOUNTER — Encounter: Payer: Self-pay | Admitting: Family Medicine

## 2020-12-08 NOTE — Telephone Encounter (Signed)
Nurses It is necessary to connect with patient please Find out how long as this been going on for and how often?  Many of these type of spells are related to drug interactions or potentially neurologic issues or potentially behavioral health issues.  It will be very difficult to know for certain additional information from the patient will be helpful.  May need further consultation with neurology may need follow-up with psychiatry and may need follow-up here as well  For safety purposes any patient who does not feel that they are in total control of their cognitive awareness should work with family or friends to have proper supervision during these times.  Also if this impacts patient's alertness or cognitive's ability she should not drive during these spells

## 2020-12-08 NOTE — Telephone Encounter (Signed)
Left message to return call to get more information 

## 2020-12-08 NOTE — Telephone Encounter (Signed)
Discussed with pt. Pt states this started around thanksgiving and happens every day. States spells vary from how long they last each time.  She does have family with her.

## 2020-12-08 NOTE — Telephone Encounter (Signed)
Nurses At some point in time I would recommend in person evaluation with the patient  I would also recommend that the patient work closely with her pain medicine doctor to see if tapering or lowering of some of her medications would be feasible over time she should not do this on her own

## 2020-12-09 ENCOUNTER — Other Ambulatory Visit: Payer: Self-pay

## 2020-12-09 ENCOUNTER — Ambulatory Visit (INDEPENDENT_AMBULATORY_CARE_PROVIDER_SITE_OTHER): Payer: Medicaid Other | Admitting: Clinical

## 2020-12-09 DIAGNOSIS — F411 Generalized anxiety disorder: Secondary | ICD-10-CM | POA: Diagnosis not present

## 2020-12-09 DIAGNOSIS — F332 Major depressive disorder, recurrent severe without psychotic features: Secondary | ICD-10-CM

## 2020-12-09 NOTE — Progress Notes (Addendum)
  Virtual Visit via Telephone Note  I connected withEmily [name] 1/4/22at 10:00 AM EDTby telephoneand verified that I am speaking with the correct person using two identifiers.  Location: Patient:Home Provider:Office  I discussed the limitations, risks, security and privacy concerns of performing an evaluation and management service by telephone and the availability of in person appointments. I also discussed with the patient that there may be a patient responsible charge related to this service. The patient expressed understanding and agreed to proceed.    THERAPIST PROGRESS NOTE  Session Time:10:00AM-10:45AM  Participation Level:Active  Behavioral Response:CasualAlertDepressedand NA  Type of Therapy:Individual Therapy  Treatment Goals addressed:Coping  Interventions:CBT, Motivational Interviewing, Strength-based and Supportive  Summary:Penny Hennisis a33y.o.femalewho presents withGAD and MDD.The OPT therapist worked with thepatientfor herinitialOPT treatment session. The OPT therapist utilized Motivational Interviewing to assist in creating therapeutic repore. The patient in the session was engaged and work in Tour manager about hertriggers and symptoms over the past few weeksincludingmoving into a new place, interactions with family, and her relationship.The OPT therapist worked with the patient implementing elements of CBT.The OPT therapist provided psycho-education during the session.The OPT therapist worked with the patient on using her supports and communicating her feelings and focusing in areas that she has control in.  Suicidal/Homicidal:Nowithout intent/plan  Therapist Response:The OPT therapist worked with the patient for the patientsscheduled session. The patient was engaged in hersession and gave feedback in relation to triggers, symptoms, and behavior responses over the pastfewweeks. The patienthas  been having difficultymanaging symptoms of herGAD and MDD while adjusting to recently moving with her children to an apartment.The OPT therapist in sessionworkedwith the patient and utilized elements of CBT in working with the patient to help empower the patient to set healthy boundaries and put her effort into areas she has control to make decisions about.The OPT therapist worked with the patient on, positive thinking, communication, and coping strategies.  Plan: Return again in2/3weeks.  Diagnosis:Axis I:Depression and GAD  Axis II:No diagnosis  I discussed the assessment and treatment plan with the patient. The patient was provided an opportunity to ask questions and all were answered. The patient agreed with the plan and demonstrated an understanding of the instructions.  The patient was advised to call back or seek an in-person evaluation if the symptoms worsen or if the condition fails to improve as anticipated.  I provided54minutes of non-face-to-face time during this encounter.  Penny Burn, LCSW 12/09/2020

## 2020-12-10 NOTE — Telephone Encounter (Signed)
Nurses In these situations it is not clear what is causing this.  It is possibility that her medications are causing side effects.  It is also possible this could be a behavioral health issue.  It is also possible it could be a neurologic issue.  If the patient has these have been as best as possible staying in a safe situation until they pass.  Ultimately patient will need further follow-up and evaluation not only by [name] but also by mental health and neurology.  If Penny [name] needs help in setting these things up please let me know

## 2020-12-12 ENCOUNTER — Telehealth: Payer: Self-pay | Admitting: Family Medicine

## 2020-12-12 DIAGNOSIS — M8580 Other specified disorders of bone density and structure, unspecified site: Secondary | ICD-10-CM | POA: Diagnosis not present

## 2020-12-12 DIAGNOSIS — R200 Anesthesia of skin: Secondary | ICD-10-CM

## 2020-12-12 DIAGNOSIS — E559 Vitamin D deficiency, unspecified: Secondary | ICD-10-CM | POA: Diagnosis not present

## 2020-12-12 DIAGNOSIS — M2550 Pain in unspecified joint: Secondary | ICD-10-CM

## 2020-12-12 DIAGNOSIS — R5383 Other fatigue: Secondary | ICD-10-CM

## 2020-12-12 DIAGNOSIS — R4182 Altered mental status, unspecified: Secondary | ICD-10-CM

## 2020-12-12 DIAGNOSIS — R55 Syncope and collapse: Secondary | ICD-10-CM

## 2020-12-12 NOTE — Telephone Encounter (Signed)
Nurses Please see telephone message

## 2020-12-12 NOTE — Telephone Encounter (Signed)
(  Not sure where these orders went to, I thought I wrote them down to be forwarded last week but no problem)  CBC, CMP, C-reactive protein, B12, sed rate, rheumatoid factor, TSH, free T4  Diagnosis syncope, numbness, altered mental status, other fatigue, arthralgia

## 2020-12-15 DIAGNOSIS — M8580 Other specified disorders of bone density and structure, unspecified site: Secondary | ICD-10-CM | POA: Diagnosis not present

## 2020-12-15 NOTE — Telephone Encounter (Signed)
Lab orders placed and pt is aware. Sent message to pt via my chart.

## 2020-12-15 NOTE — Addendum Note (Signed)
Addended by: Vicente Males on: 12/15/2020 10:54 AM   Modules accepted: Orders

## 2020-12-18 ENCOUNTER — Telehealth (INDEPENDENT_AMBULATORY_CARE_PROVIDER_SITE_OTHER): Payer: Self-pay | Admitting: *Deleted

## 2020-12-18 NOTE — Telephone Encounter (Signed)
Patient left message real sick on stomach wants to talk to nurse please call [phone]

## 2020-12-19 ENCOUNTER — Other Ambulatory Visit (INDEPENDENT_AMBULATORY_CARE_PROVIDER_SITE_OTHER): Payer: Self-pay | Admitting: Gastroenterology

## 2020-12-19 MED ORDER — PROMETHAZINE HCL 12.5 MG PO TABS: 12.5000 mg | ORAL_TABLET | Freq: Three times a day (TID) | ORAL | 0 refills | Status: DC | PRN

## 2020-12-19 NOTE — Telephone Encounter (Signed)
I talked with the patient. She states that she is sick and having al ot of discomfort. Unable to eat this is very difficult for her. She states that she is having a lot of acid reflux and heartburn . She is also having issue with her stomach burning.  Patient uses [facility]. Her contact number is [phone].

## 2020-12-19 NOTE — Telephone Encounter (Signed)
Spoke with the patient today, who reported multiple complaints including nausea, vomiting, heartburn, constipation, abdominal bloating.  I advised the patient it will be more useful if she follows in the clinic so we can have a complete evaluation of her symptoms.  Please make a follow-up appointment in the next available.  I prescribed Phenergan as needed for now due to persistent nausea.

## 2020-12-22 ENCOUNTER — Other Ambulatory Visit: Payer: Self-pay | Admitting: Obstetrics and Gynecology

## 2020-12-22 NOTE — Patient Instructions (Signed)
  Hi Ms.  Veiga, sorry we missed you today  - as a part of your Medicaid benefit, you are eligible for care management and care coordination services at no cost or copay. I was unable to reach you by phone today but would be happy to help you with your health related needs. Please feel free to call me at [phone].  A member of the Managed Medicaid care management team will reach out to you again over the next 7 days.   Aida Raider RN, BSN [place]  [facility] - Managed Medicaid High Risk [phone].

## 2020-12-22 NOTE — Patient Outreach (Signed)
Care Coordination  12/22/2020  Penny [name] Jan 29, 1986 [id number]    Medicaid Managed Care   Unsuccessful Outreach Note  12/22/2020 Name: Penny [name] MRN: [id number] DOB: 09-18-1986  Referred by: Kathyrn Drown, MD Reason for referral : High Risk Managed Medicaid (Unsuccessful telephone outreach)   An unsuccessful telephone outreach was attempted today. The patient was referred to the case management team for assistance with care management and care coordination.   Follow Up Plan: The Managed Medicaid  care management team will reach out to the patient again over the next 7 days.   Aida Raider RN, BSN [place]  [facility] - Managed Medicaid High Risk [phone].

## 2020-12-24 NOTE — Telephone Encounter (Signed)
Penny [name] , Please add patient to Dr.Castaneda's schedule. Refer to his note.

## 2020-12-25 ENCOUNTER — Other Ambulatory Visit: Payer: Self-pay

## 2020-12-25 ENCOUNTER — Ambulatory Visit (HOSPITAL_COMMUNITY): Payer: Medicaid Other | Admitting: Clinical

## 2020-12-25 ENCOUNTER — Ambulatory Visit (INDEPENDENT_AMBULATORY_CARE_PROVIDER_SITE_OTHER): Payer: Medicaid Other | Admitting: Gastroenterology

## 2020-12-25 ENCOUNTER — Telehealth (HOSPITAL_COMMUNITY): Payer: Self-pay | Admitting: Clinical

## 2020-12-25 ENCOUNTER — Encounter (INDEPENDENT_AMBULATORY_CARE_PROVIDER_SITE_OTHER): Payer: Self-pay | Admitting: Gastroenterology

## 2020-12-25 VITALS — BP 104/66 | HR 108 | Temp 97.7°F | Ht 66.0 in | Wt 151.0 lb

## 2020-12-25 DIAGNOSIS — G8929 Other chronic pain: Secondary | ICD-10-CM

## 2020-12-25 DIAGNOSIS — K5903 Drug induced constipation: Secondary | ICD-10-CM

## 2020-12-25 DIAGNOSIS — M546 Pain in thoracic spine: Secondary | ICD-10-CM | POA: Diagnosis not present

## 2020-12-25 DIAGNOSIS — R000 Tachycardia, unspecified: Secondary | ICD-10-CM | POA: Diagnosis not present

## 2020-12-25 DIAGNOSIS — K834 Spasm of sphincter of Oddi: Secondary | ICD-10-CM | POA: Insufficient documentation

## 2020-12-25 DIAGNOSIS — K5900 Constipation, unspecified: Secondary | ICD-10-CM | POA: Insufficient documentation

## 2020-12-25 DIAGNOSIS — R109 Unspecified abdominal pain: Secondary | ICD-10-CM | POA: Diagnosis not present

## 2020-12-25 DIAGNOSIS — Z79891 Long term (current) use of opiate analgesic: Secondary | ICD-10-CM | POA: Diagnosis not present

## 2020-12-25 DIAGNOSIS — Z1152 Encounter for screening for COVID-19: Secondary | ICD-10-CM | POA: Diagnosis not present

## 2020-12-25 DIAGNOSIS — Z79899 Other long term (current) drug therapy: Secondary | ICD-10-CM | POA: Diagnosis not present

## 2020-12-25 MED ORDER — LINACLOTIDE 145 MCG PO CAPS
145.0000 ug | ORAL_CAPSULE | Freq: Every day | ORAL | 5 refills | Status: DC
Start: 2020-12-25 — End: 2021-01-30

## 2020-12-25 NOTE — H&P (View-Only) (Signed)
[name], M.D. Gastroenterology & Hepatology [facility] [address] [place], [place] [address]  Primary Care Physician: Kathyrn Drown, MD [place] [id number]  I will communicate my assessment and recommendations to the referring MD via EMR.  Problems: 1. 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 Chronic opiate use  History of Present Illness: Penny [name] is a 35 y.o. female with past medical history of ADHD, anxiety, depression, sphincter of Oddi dysfunction, fibromyalgia, lupus, seizures, and chronic abdominal pain who presents for follow up of abdominal pain.  The patient was last seen on 08/27/2019. At that time, the patient was presenting symptoms of weakness, syncope and shortness of breath, as well as anxiety, the patient was asked to go to the ER.  In the ER, the patient underwent an MRCP, CT abdomen and right upper quadrant ultrasound that were negative for any alterations.  She underwent an EGD that showed a small anastomotic ulcer but pain gastrojejunostomy.  Due to concern for sphincter of Oddi dysfunction, the patient was referred to [facility] for further evaluation.  The patient was evaluated in this hospital and she was considered to have type I sphincter of Oddi dysfunction given mild biliary dilation, for which she underwent an ERCP with subsequent sphincterotomy and stent placement, the biliary tree was swept but no stones or abnormalities were observed.  Her course was complicated by post ERCP pancreatitis.  Since then, the patient was being followed by Dr Wende Mott who evaluated her on 02/08/2020.  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  She was advised to  stop naloxegol and to start Amitiza 24 mcg twice daily and to taper off opioids, as well as to possibly consider anorectal manometry.  Patient reports that she has had persistence of abdominal pain 3 months but she reports that she presented severe abdominal pain since last weekend.  She has had persistent nausea and vomits every time she tries to eat.  Has felt very weak overall and states that she cannot tolerate the pain she has been very severe.  Has not been able to eat too much food. She has not had any pain medicines since last Tuesday as she ran out of her prescription, could not make it to her appointment due to the pain.   Patient is taking Trulance for constipation daily but has a BM every week without relief. Has not tried Linzess or Amitiza. Has not tried Movantik.  Patient reports that since she got COVID-19 in September 2021 her symptoms have been worse and she is feeling "desperate due to the pain".  Notably, she takes Dilaudid every 6 hours for abdominal pain.  Has been following with behavioral health, currently on BuSpar and Cymbalta, as well as trazodone.  The patient denies having any fever, chills, hematochezia, melena, hematemesis, abdominal distention, diarrhea, jaundice, pruritus or weight loss.  Past Medical History: Past Medical History:  Diagnosis Date  . ADD (attention deficit disorder)   . Anemia 2011   2o to GASTRIC BYPASS  . Anginal pain ([place])   . Anxiety   . Asthma   . Blood transfusion without reported diagnosis   . Chronic daily headache   . Depression   . Depression   . Dysmenorrhea 09/18/2013  . Dysrhythmia   .  Elevated liver enzymes JUL 2011ALK PHOS 111-127 AST  143-267 ALT  213-321T BILI 0.6  ALB  3.7-4.06 Jun 2011 ALK PHOS 118 AST 24 ALT 42 T BILI 0.4 ALB 3.9  . Encounter for drug rehabilitation    at behavioral health for opioid addiction about 4 years ago  . Family history of adverse reaction to anesthesia    'dad had to be kept on pump  for breathing for morphine'  . Fatty liver   . Fibroid 01/18/2017  . Fibromyalgia   . Gastric bypass status for obesity   . Gastritis JULY 2011  . Heart murmur   . 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 History of Holter monitoring   . Hx of opioid abuse ([place])    for about 4 years, about 4 years ago  . Interstitial cystitis   . Iron deficiency anemia 07/23/2010  . Irritable bowel syndrome 2012 DIARRHEA   JUN 2012 TTG IgA 14.9  . IUD FEB 2010  . Lupus ([place])   . Menorrhagia 07/18/2013  . Migraines   . Obesity (BMI 30-39.9) 2011 228 LBS BMI 36.8  . Ovarian cyst   . Patient desires pregnancy 09/18/2013  . Polyneuropathy   . PONV (postoperative nausea and vomiting) seizure post-operatively   seizure following ankle surgery  . Potassium (K) deficiency   . Pregnant 12/25/2013  . Psychiatric pseudoseizure   . RLQ abdominal pain 07/18/2013  . Sciatica of left side 11/14/2014  . 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 Stress 09/18/2013    Past Surgical History: Past Surgical History:  Procedure Laterality Date  . BIOPSY  04/10/2015   Procedure: BIOPSY;  Surgeon: Daneil Dolin, MD;  Location: [facility];  Service: Endoscopy;;  . cath self every nite     for sodium bicarb injection (discontinued 2013)  . CHOLECYSTECTOMY  2005   biliary dyskinesia  . COLONOSCOPY  JUN 2012 ABD PN/DIARRHEA WITH PROPOFOL   NL COLON  . DILATION AND CURETTAGE OF UTERUS    . DILITATION & CURRETTAGE/HYSTROSCOPY WITH NOVASURE ABLATION N/A 03/24/2017   Procedure: DILATATION & CURETTAGE/HYSTEROSCOPY WITH NOVASURE ENDOMETRIAL ABLATION;  Surgeon: Jonnie Kind, MD;  Location: [facility];  Service: Gynecology;  Laterality: N/A;  . ESOPHAGEAL DILATION N/A 04/10/2015   Procedure: ESOPHAGEAL DILATION WITH 54FR MALONEY DILATOR;  Surgeon: Daneil Dolin, MD;  Location: [facility];  Service: Endoscopy;  Laterality: N/A;  . ESOPHAGOGASTRODUODENOSCOPY    . ESOPHAGOGASTRODUODENOSCOPY (EGD) WITH PROPOFOL N/A  04/10/2015   Procedure: ESOPHAGOGASTRODUODENOSCOPY (EGD) WITH PROPOFOL;  Surgeon: Daneil Dolin, MD;  Location: [facility];  Service: Endoscopy;  Laterality: N/A;  . ESOPHAGOGASTRODUODENOSCOPY (EGD) WITH PROPOFOL N/A 12/06/2016   Procedure: ESOPHAGOGASTRODUODENOSCOPY (EGD) WITH PROPOFOL;  Surgeon: Danie Binder, MD;  [facility];  Service: Endoscopy;  Laterality: N/A;  . ESOPHAGOGASTRODUODENOSCOPY (EGD) WITH PROPOFOL N/A 04/27/2019   Procedure: ESOPHAGOGASTRODUODENOSCOPY (EGD) WITH PROPOFOL;  Surgeon: Rogene Houston, MD;  [facility];  Service: Endoscopy;  Laterality: N/A;  730  . ESOPHAGOGASTRODUODENOSCOPY (EGD) WITH PROPOFOL N/A 08/31/2019   Procedure: ESOPHAGOGASTRODUODENOSCOPY (EGD) WITH PROPOFOL;  Surgeon: Rogene Houston, MD;  [facility];  Service: Endoscopy;  Laterality: N/A;  . GAB  2007   in [place]-POUCH 5 CM  . GASTRIC BYPASS  06/2006  . HYSTEROSCOPY WITH D & C N/A 09/12/2014   Procedure: DILATATION AND CURETTAGE /HYSTEROSCOPY;  Surgeon: Jonnie Kind, MD;  Location: [facility];  Service: Gynecology;  Laterality:  N/A;  . KYPHOPLASTY N/A 08/02/2018   Procedure: KYPHOPLASTY T12;  Surgeon: Melina Schools, MD;  Location: [facility];  Service: Orthopedics;  Laterality: N/A;  60 mins  . LAPAROSCOPIC TUBAL LIGATION Bilateral 03/24/2017   Procedure: LAPAROSCOPIC TUBAL LIGATION ([place] Rings);  Surgeon: Jonnie Kind, MD;  Location: [facility];  Service: Gynecology;  Laterality: Bilateral;  . REPAIR VAGINAL CUFF N/A 07/30/2014   Procedure: REPAIR VAGINAL CUFF;  Surgeon: Mora Bellman, MD;  Location: [facility];  Service: Gynecology;  Laterality: N/A;  . SAVORY DILATION  06/20/2012   Dr. Barnie Alderman gastritis/Ulcer in the mid jejunum. Empiric dilation.   . SURAL NERVE BX Left 02/25/2016   Procedure: LEFT SURAL NERVE BIOPSY;  Surgeon: Jovita Gamma, MD;  Location: [facility];  Service: Neurosurgery;  Laterality: Left;  Left sural nerve biopsy  . TONSILLECTOMY    .  TONSILLECTOMY AND ADENOIDECTOMY    . UPPER GASTROINTESTINAL ENDOSCOPY  JULY 2011 NAUSEA-D125,V6, PH 25   Bx; GASTRITIS, POUCH-5 CM LONG  . WISDOM TOOTH EXTRACTION      Family History: Family History  Problem Relation Age of Onset  . Hemochromatosis Maternal Grandmother   . Migraines Maternal Grandmother   . Cancer Maternal Grandmother   . Breast cancer Maternal Grandmother   . Hypertension Father   . Diabetes Father   . Coronary artery disease Father   . Migraines Paternal Grandmother   . Breast cancer Paternal Grandmother   . Cancer Mother        breast  . Hemochromatosis Mother   . Breast cancer Mother   . Depression Mother   . Anxiety disorder Mother   . Coronary artery disease Paternal Grandfather   . Anxiety disorder Brother   . Bipolar disorder Brother   . Healthy Daughter   . Healthy Son     Social History: Social History   Tobacco Use  Smoking Status Former Smoker  . Packs/day: 0.25  . Years: 6.00  . Pack years: 1.50  . Types: Cigarettes  . Start date: 05/2018  Smokeless Tobacco Never Used   Social History   Substance and Sexual Activity  Alcohol Use Not Currently  . Alcohol/week: 0.0 standard drinks   Social History   Substance and Sexual Activity  Drug Use Not Currently    Allergies: Allergies  Allergen Reactions  . Gabapentin Other (See Comments)    Pt states that she was unresponsive after taking this medication, but her vitals remained stable.    . Metoclopramide Hcl Anxiety and Other (See Comments)    Pt states that she felt like she was trapped in a box, and could not get out.  Pt also states that she had temporary loss of movement, weakness, and tingling.    . Other Itching, Rash and Hives    Please use paper tape Other reaction(s): Other (See Comments) Please use paper tape  . Tramadol Other (See Comments)    Seizures  . Ativan [Lorazepam] Other (See Comments)    combative  . Latex Itching, Rash and Other (See Comments)    Burns   . Lyrica [Pregabalin] Other (See Comments)    suicidal  . Trazodone And Nefazodone Other (See Comments)    Makes pt "like a zombie"  . Zofran [Ondansetron] Other (See Comments)    Migraines in high doses    . Iron Other (See Comments)    PATIENT IS INTOLERANT TO ORAL IRON , SHE DOES NOT ABSORB IT.    Marland Kitchen Sulfa Antibiotics Hives  .  Butrans [Buprenorphine] Rash    Patch caused rash, didn't help with pain  . Nucynta [Tapentadol] Nausea And Vomiting  . Propofol Nausea And Vomiting  . Tape Itching, Rash and Other (See Comments)    Please use paper tape  . Toradol [Ketorolac Tromethamine] Anxiety    Medications: Current Outpatient Medications  Medication Sig Dispense Refill  . AIMOVIG 70 MG/ML SOAJ SMARTSIG:70 Milligram(s) SUB-Q Every 4 Weeks    . amphetamine-dextroamphetamine (ADDERALL XR) 20 MG 24 hr capsule Take 1 capsule (20 mg total) by mouth every morning. 30 capsule 0  . BELBUCA 150 MCG FILM Take 1 strip by mouth 2 (two) times daily.    . budesonide-formoterol (SYMBICORT) 160-4.5 MCG/ACT inhaler Inhale 2 puffs into the lungs 2 (two) times daily. 1 Inhaler 5  . busPIRone (BUSPAR) 10 MG tablet Take 1 tablet (10 mg total) by mouth 3 (three) times daily. 90 tablet 2  . cetirizine (ZYRTEC) 10 MG tablet Take 10 mg by mouth daily.    . clonazePAM (KLONOPIN) 0.5 MG tablet Take 1 tablet (0.5 mg total) by mouth 2 (two) times daily. 60 tablet 2  . DULoxetine (CYMBALTA) 60 MG capsule Take 1 capsule (60 mg total) by mouth 2 (two) times daily. 180 capsule 2  . HYDROmorphone (DILAUDID) 2 MG tablet Take 2 tablets (4 mg total) by mouth every 12 (twelve) hours as needed for severe pain. Take 1-2 tablets po q 4 hrs prn pain no greater than 9 tablets/day 1 tablet 0  . ondansetron (ZOFRAN-ODT) 8 MG disintegrating tablet Take 1 tablet (8 mg total) by mouth every 8 (eight) hours as needed. 50 tablet 3  . pantoprazole (PROTONIX) 40 MG tablet TAKE 1 TABLET BY MOUTH TWICE DAILY BEFORE MEALS. (Patient taking  differently: Take 40 mg by mouth 2 (two) times daily.) 60 tablet 3  . promethazine (PHENERGAN) 12.5 MG tablet Take 1 tablet (12.5 mg total) by mouth every 8 (eight) hours as needed for nausea or vomiting. 30 tablet 0  . rOPINIRole (REQUIP) 1 MG tablet Take one tablet po each evening. 30 tablet 5  . traZODone (DESYREL) 100 MG tablet Take 2 tablets (200 mg total) by mouth at bedtime. 60 tablet 2  . TRULANCE 3 MG TABS TAKE ONE TABLET BY MOUTH ONCE DAILY. 90 tablet 1  . valACYclovir (VALTREX) 1000 MG tablet TAKE 1 TABLET BY MOUTH DAILY. 90 tablet PRN   No current facility-administered medications for this visit.    Review of Systems: GENERAL: negative for malaise, night sweats HEENT: No changes in hearing or vision, no nose bleeds or other nasal problems. NECK: Negative for lumps, goiter, pain and significant neck swelling RESPIRATORY: Negative for cough, wheezing CARDIOVASCULAR: Negative for chest pain, leg swelling, palpitations, orthopnea GI: SEE HPI MUSCULOSKELETAL: Negative for joint pain or swelling, back pain, and muscle pain. SKIN: Negative for lesions, rash PSYCH: Negative for sleep disturbance, mood disorder and recent psychosocial stressors. HEMATOLOGY Negative for prolonged bleeding, bruising easily, and swollen nodes. ENDOCRINE: Negative for cold or heat intolerance, polyuria, polydipsia and goiter. NEURO: negative for tremor, gait imbalance, syncope and seizures. The remainder of the review of systems is noncontributory.   Physical Exam: BP 104/66 (BP Location: Left Arm, Patient Position: Sitting, Cuff Size: Large)   Pulse (!) 108   Temp 97.7 F (36.5 C) (Oral)   Ht 5' 6" (1.676 m)   Wt 151 lb (68.5 kg)   BMI 24.37 kg/m  GENERAL: The patient is AO x3, in no acute distress.  Patient is  very tearful. HEENT: Head is normocephalic and atraumatic. EOMI are intact. Mouth is well hydrated and without lesions. NECK: Supple. No masses LUNGS: Clear to auscultation. No presence  of rhonchi/wheezing/rales. Adequate chest expansion HEART: RRR, normal s1 and s2. ABDOMEN: Very tender to palpation in the right upper quadrant and right flank but no guarding, no peritoneal signs, and nondistended. BS +. No masses. EXTREMITIES: Without any cyanosis, clubbing, rash, lesions or edema. NEUROLOGIC: AOx3, no focal motor deficit. SKIN: no jaundice, no rashes  Imaging/Labs: as above  I personally reviewed and interpreted the available labs, imaging and endoscopic files.  Impression and Plan: Penny [name] is a 35 y.o. female with past medical history of ADHD, anxiety, depression, sphincter of Oddi dysfunction, fibromyalgia, lupus, seizures, and chronic abdominal pain who presents for follow up of abdominal pain.  The patient has presented exacerbation of her chronic right upper quadrant abdominal pain which has been thoroughly evaluated in the past.  Endoscopic procedures have been performed for this, as well as evaluation with abdominal imaging previously.  It was considered that her symptoms were possibly related to central Oddi dysfunction but she actually did not have any improvement after endoscopic retrograde cholangiopancreatography was done and this was complicated with post ERCP pancreatitis.  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  We will perform a right upper quadrant ultrasound as part of evaluation for her pain, although I consider this to be less likely to be abnormal.  We will also evaluate her liver enzymes with a CMP and CBC.  If the patient were to have severe pain, she will need to go to the ER to have an urgent a comprehensive fast evaluation.  She would benefit from the use of Linzess as it has a anti nociceptive effect  which could improve her symptoms better than the Trulance.  I also emphasized to her the importance of tapering her opiates as this can increase her bowel hypersensitivity.  Patient understood and agreed.  - Schedule liver US - Stop Trulance - Start Linzess 145 mcg qday - Check CBC and CMP - Try to wean opiates as much as possible, follow with pain management  All questions were answered.      Total length of visit: 35 minutes  Harvel Quale, MD Gastroenterology and Hepatology [facility]

## 2020-12-25 NOTE — Telephone Encounter (Signed)
Pt did not respond to video link, phone call, ot VM

## 2020-12-25 NOTE — Patient Instructions (Signed)
Schedule liver US Stop Trulance Start Linzess 145 mcg qday Perform blood workup Try to wean opiates as much as possible, follow with pain management

## 2020-12-25 NOTE — Progress Notes (Signed)
[name], M.D. Gastroenterology & Hepatology [facility] [address] [place], [place] [address]  Primary Care Physician: Kathyrn Drown, MD [place] [id number]  I will communicate my assessment and recommendations to the referring MD via EMR.  Problems: 1. 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 Chronic opiate use  History of Present Illness: Penny [name] is a 35 y.o. female with past medical history of ADHD, anxiety, depression, sphincter of Oddi dysfunction, fibromyalgia, lupus, seizures, and chronic abdominal pain who presents for follow up of abdominal pain.  The patient was last seen on 08/27/2019. At that time, the patient was presenting symptoms of weakness, syncope and shortness of breath, as well as anxiety, the patient was asked to go to the ER.  In the ER, the patient underwent an MRCP, CT abdomen and right upper quadrant ultrasound that were negative for any alterations.  She underwent an EGD that showed a small anastomotic ulcer but pain gastrojejunostomy.  Due to concern for sphincter of Oddi dysfunction, the patient was referred to [facility] for further evaluation.  The patient was evaluated in this hospital and she was considered to have type I sphincter of Oddi dysfunction given mild biliary dilation, for which she underwent an ERCP with subsequent sphincterotomy and stent placement, the biliary tree was swept but no stones or abnormalities were observed.  Her course was complicated by post ERCP pancreatitis.  Since then, the patient was being followed by Dr Wende Mott who evaluated her on 02/08/2020.  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  She was advised to  stop naloxegol and to start Amitiza 24 mcg twice daily and to taper off opioids, as well as to possibly consider anorectal manometry.  Patient reports that she has had persistence of abdominal pain 3 months but she reports that she presented severe abdominal pain since last weekend.  She has had persistent nausea and vomits every time she tries to eat.  Has felt very weak overall and states that she cannot tolerate the pain she has been very severe.  Has not been able to eat too much food. She has not had any pain medicines since last Tuesday as she ran out of her prescription, could not make it to her appointment due to the pain.   Patient is taking Trulance for constipation daily but has a BM every week without relief. Has not tried Linzess or Amitiza. Has not tried Movantik.  Patient reports that since she got COVID-19 in September 2021 her symptoms have been worse and she is feeling "desperate due to the pain".  Notably, she takes Dilaudid every 6 hours for abdominal pain.  Has been following with behavioral health, currently on BuSpar and Cymbalta, as well as trazodone.  The patient denies having any fever, chills, hematochezia, melena, hematemesis, abdominal distention, diarrhea, jaundice, pruritus or weight loss.  Past Medical History: Past Medical History:  Diagnosis Date  . ADD (attention deficit disorder)   . Anemia 2011   2o to GASTRIC BYPASS  . Anginal pain ([place])   . Anxiety   . Asthma   . Blood transfusion without reported diagnosis   . Chronic daily headache   . Depression   . Depression   . Dysmenorrhea 09/18/2013  . Dysrhythmia   .  Elevated liver enzymes JUL 2011ALK PHOS 111-127 AST  143-267 ALT  213-321T BILI 0.6  ALB  3.7-4.06 Jun 2011 ALK PHOS 118 AST 24 ALT 42 T BILI 0.4 ALB 3.9  . Encounter for drug rehabilitation    at behavioral health for opioid addiction about 4 years ago  . Family history of adverse reaction to anesthesia    'dad had to be kept on pump  for breathing for morphine'  . Fatty liver   . Fibroid 01/18/2017  . Fibromyalgia   . Gastric bypass status for obesity   . Gastritis JULY 2011  . Heart murmur   . 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 History of Holter monitoring   . Hx of opioid abuse (HCC)    for about 4 years, about 4 years ago  . Interstitial cystitis   . Iron deficiency anemia 07/23/2010  . Irritable bowel syndrome 2012 DIARRHEA   JUN 2012 TTG IgA 14.9  . IUD FEB 2010  . Lupus (HCC)   . Menorrhagia 07/18/2013  . Migraines   . Obesity (BMI 30-39.9) 2011 228 LBS BMI 36.8  . Ovarian cyst   . Patient desires pregnancy 09/18/2013  . Polyneuropathy   . PONV (postoperative nausea and vomiting) seizure post-operatively   seizure following ankle surgery  . Potassium (K) deficiency   . Pregnant 12/25/2013  . Psychiatric pseudoseizure   . RLQ abdominal pain 07/18/2013  . Sciatica of left side 11/14/2014  . 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 Stress 09/18/2013    Past Surgical History: Past Surgical History:  Procedure Laterality Date  . BIOPSY  04/10/2015   Procedure: BIOPSY;  Surgeon: Robert M Rourk, MD;  Location: [facility];  Service: Endoscopy;;  . cath self every nite     for sodium bicarb injection (discontinued 2013)  . CHOLECYSTECTOMY  2005   biliary dyskinesia  . COLONOSCOPY  JUN 2012 ABD PN/DIARRHEA WITH PROPOFOL   NL COLON  . DILATION AND CURETTAGE OF UTERUS    . DILITATION & CURRETTAGE/HYSTROSCOPY WITH NOVASURE ABLATION N/A 03/24/2017   Procedure: DILATATION & CURETTAGE/HYSTEROSCOPY WITH NOVASURE ENDOMETRIAL ABLATION;  Surgeon: John Ferguson V, MD;  Location: [facility];  Service: Gynecology;  Laterality: N/A;  . ESOPHAGEAL DILATION N/A 04/10/2015   Procedure: ESOPHAGEAL DILATION WITH 54FR MALONEY DILATOR;  Surgeon: Robert M Rourk, MD;  Location: [facility];  Service: Endoscopy;  Laterality: N/A;  . ESOPHAGOGASTRODUODENOSCOPY    . ESOPHAGOGASTRODUODENOSCOPY (EGD) WITH PROPOFOL N/A  04/10/2015   Procedure: ESOPHAGOGASTRODUODENOSCOPY (EGD) WITH PROPOFOL;  Surgeon: Robert M Rourk, MD;  Location: [facility];  Service: Endoscopy;  Laterality: N/A;  . ESOPHAGOGASTRODUODENOSCOPY (EGD) WITH PROPOFOL N/A 12/06/2016   Procedure: ESOPHAGOGASTRODUODENOSCOPY (EGD) WITH PROPOFOL;  Surgeon: Sandi L Fields, MD;  Location: [facility];  Service: Endoscopy;  Laterality: N/A;  . ESOPHAGOGASTRODUODENOSCOPY (EGD) WITH PROPOFOL N/A 04/27/2019   Procedure: ESOPHAGOGASTRODUODENOSCOPY (EGD) WITH PROPOFOL;  Surgeon: Rehman, Najeeb U, MD;  [facility]: [facility];  Service: Endoscopy;  Laterality: N/A;  730  . ESOPHAGOGASTRODUODENOSCOPY (EGD) WITH PROPOFOL N/A 08/31/2019   Procedure: ESOPHAGOGASTRODUODENOSCOPY (EGD) WITH PROPOFOL;  Surgeon: Rehman, Najeeb U, MD;  Location: [facility];  Service: Endoscopy;  Laterality: N/A;  . GAB  2007   in [place]-POUCH 5 CM  . GASTRIC BYPASS  06/2006  . HYSTEROSCOPY WITH D & C N/A 09/12/2014   Procedure: DILATATION AND CURETTAGE /HYSTEROSCOPY;  Surgeon: John Ferguson V, MD;  Location: [facility];  Service: Gynecology;  Laterality:   N/A;  . KYPHOPLASTY N/A 08/02/2018   Procedure: KYPHOPLASTY T12;  Surgeon: Brooks, Dahari, MD;  Location: [facility];  Service: Orthopedics;  Laterality: N/A;  60 mins  . LAPAROSCOPIC TUBAL LIGATION Bilateral 03/24/2017   Procedure: LAPAROSCOPIC TUBAL LIGATION ([place] Rings);  Surgeon: John Ferguson V, MD;  Location: [facility];  Service: Gynecology;  Laterality: Bilateral;  . REPAIR VAGINAL CUFF N/A 07/30/2014   Procedure: REPAIR VAGINAL CUFF;  Surgeon: [name] Constant, MD;  Location: [facility];  Service: Gynecology;  Laterality: N/A;  . SAVORY DILATION  06/20/2012   Dr. Fields:Mild gastritis/Ulcer in the mid jejunum. Empiric dilation.   . SURAL NERVE BX Left 02/25/2016   Procedure: LEFT SURAL NERVE BIOPSY;  Surgeon: Robert Nudelman, MD;  Location: [facility];  Service: Neurosurgery;  Laterality: Left;  Left sural nerve biopsy  . TONSILLECTOMY    .  TONSILLECTOMY AND ADENOIDECTOMY    . UPPER GASTROINTESTINAL ENDOSCOPY  JULY 2011 NAUSEA-D125,V6, PH 25   Bx; GASTRITIS, POUCH-5 CM LONG  . WISDOM TOOTH EXTRACTION      Family History: Family History  Problem Relation Age of Onset  . Hemochromatosis Maternal Grandmother   . Migraines Maternal Grandmother   . Cancer Maternal Grandmother   . Breast cancer Maternal Grandmother   . Hypertension Father   . Diabetes Father   . Coronary artery disease Father   . Migraines Paternal Grandmother   . Breast cancer Paternal Grandmother   . Cancer Mother        breast  . Hemochromatosis Mother   . Breast cancer Mother   . Depression Mother   . Anxiety disorder Mother   . Coronary artery disease Paternal Grandfather   . Anxiety disorder Brother   . Bipolar disorder Brother   . Healthy Daughter   . Healthy Son     Social History: Social History   Tobacco Use  Smoking Status Former Smoker  . Packs/day: 0.25  . Years: 6.00  . Pack years: 1.50  . Types: Cigarettes  . Start date: 05/2018  Smokeless Tobacco Never Used   Social History   Substance and Sexual Activity  Alcohol Use Not Currently  . Alcohol/week: 0.0 standard drinks   Social History   Substance and Sexual Activity  Drug Use Not Currently    Allergies: Allergies  Allergen Reactions  . Gabapentin Other (See Comments)    Pt states that she was unresponsive after taking this medication, but her vitals remained stable.    . Metoclopramide Hcl Anxiety and Other (See Comments)    Pt states that she felt like she was trapped in a box, and could not get out.  Pt also states that she had temporary loss of movement, weakness, and tingling.    . Other Itching, Rash and Hives    Please use paper tape Other reaction(s): Other (See Comments) Please use paper tape  . Tramadol Other (See Comments)    Seizures  . Ativan [Lorazepam] Other (See Comments)    combative  . Latex Itching, Rash and Other (See Comments)    Burns   . Lyrica [Pregabalin] Other (See Comments)    suicidal  . Trazodone And Nefazodone Other (See Comments)    Makes pt "like a zombie"  . Zofran [Ondansetron] Other (See Comments)    Migraines in high doses    . Iron Other (See Comments)    PATIENT IS INTOLERANT TO ORAL IRON , SHE DOES NOT ABSORB IT.    . Sulfa Antibiotics Hives  .   Butrans [Buprenorphine] Rash    Patch caused rash, didn't help with pain  . Nucynta [Tapentadol] Nausea And Vomiting  . Propofol Nausea And Vomiting  . Tape Itching, Rash and Other (See Comments)    Please use paper tape  . Toradol [Ketorolac Tromethamine] Anxiety    Medications: Current Outpatient Medications  Medication Sig Dispense Refill  . AIMOVIG 70 MG/ML SOAJ SMARTSIG:70 Milligram(s) SUB-Q Every 4 Weeks    . amphetamine-dextroamphetamine (ADDERALL XR) 20 MG 24 hr capsule Take 1 capsule (20 mg total) by mouth every morning. 30 capsule 0  . BELBUCA 150 MCG FILM Take 1 strip by mouth 2 (two) times daily.    . budesonide-formoterol (SYMBICORT) 160-4.5 MCG/ACT inhaler Inhale 2 puffs into the lungs 2 (two) times daily. 1 Inhaler 5  . busPIRone (BUSPAR) 10 MG tablet Take 1 tablet (10 mg total) by mouth 3 (three) times daily. 90 tablet 2  . cetirizine (ZYRTEC) 10 MG tablet Take 10 mg by mouth daily.    . clonazePAM (KLONOPIN) 0.5 MG tablet Take 1 tablet (0.5 mg total) by mouth 2 (two) times daily. 60 tablet 2  . DULoxetine (CYMBALTA) 60 MG capsule Take 1 capsule (60 mg total) by mouth 2 (two) times daily. 180 capsule 2  . HYDROmorphone (DILAUDID) 2 MG tablet Take 2 tablets (4 mg total) by mouth every 12 (twelve) hours as needed for severe pain. Take 1-2 tablets po q 4 hrs prn pain no greater than 9 tablets/day 1 tablet 0  . ondansetron (ZOFRAN-ODT) 8 MG disintegrating tablet Take 1 tablet (8 mg total) by mouth every 8 (eight) hours as needed. 50 tablet 3  . pantoprazole (PROTONIX) 40 MG tablet TAKE 1 TABLET BY MOUTH TWICE DAILY BEFORE MEALS. (Patient taking  differently: Take 40 mg by mouth 2 (two) times daily.) 60 tablet 3  . promethazine (PHENERGAN) 12.5 MG tablet Take 1 tablet (12.5 mg total) by mouth every 8 (eight) hours as needed for nausea or vomiting. 30 tablet 0  . rOPINIRole (REQUIP) 1 MG tablet Take one tablet po each evening. 30 tablet 5  . traZODone (DESYREL) 100 MG tablet Take 2 tablets (200 mg total) by mouth at bedtime. 60 tablet 2  . TRULANCE 3 MG TABS TAKE ONE TABLET BY MOUTH ONCE DAILY. 90 tablet 1  . valACYclovir (VALTREX) 1000 MG tablet TAKE 1 TABLET BY MOUTH DAILY. 90 tablet PRN   No current facility-administered medications for this visit.    Review of Systems: GENERAL: negative for malaise, night sweats HEENT: No changes in hearing or vision, no nose bleeds or other nasal problems. NECK: Negative for lumps, goiter, pain and significant neck swelling RESPIRATORY: Negative for cough, wheezing CARDIOVASCULAR: Negative for chest pain, leg swelling, palpitations, orthopnea GI: SEE HPI MUSCULOSKELETAL: Negative for joint pain or swelling, back pain, and muscle pain. SKIN: Negative for lesions, rash PSYCH: Negative for sleep disturbance, mood disorder and recent psychosocial stressors. HEMATOLOGY Negative for prolonged bleeding, bruising easily, and swollen nodes. ENDOCRINE: Negative for cold or heat intolerance, polyuria, polydipsia and goiter. NEURO: negative for tremor, gait imbalance, syncope and seizures. The remainder of the review of systems is noncontributory.   Physical Exam: BP 104/66 (BP Location: Left Arm, Patient Position: Sitting, Cuff Size: Large)   Pulse (!) 108   Temp 97.7 F (36.5 C) (Oral)   Ht 5' 6" (1.676 m)   Wt 151 lb (68.5 kg)   BMI 24.37 kg/m  GENERAL: The patient is AO x3, in no acute distress.  Patient is  very tearful. HEENT: Head is normocephalic and atraumatic. EOMI are intact. Mouth is well hydrated and without lesions. NECK: Supple. No masses LUNGS: Clear to auscultation. No presence  of rhonchi/wheezing/rales. Adequate chest expansion HEART: RRR, normal s1 and s2. ABDOMEN: Very tender to palpation in the right upper quadrant and right flank but no guarding, no peritoneal signs, and nondistended. BS +. No masses. EXTREMITIES: Without any cyanosis, clubbing, rash, lesions or edema. NEUROLOGIC: AOx3, no focal motor deficit. SKIN: no jaundice, no rashes  Imaging/Labs: as above  I personally reviewed and interpreted the available labs, imaging and endoscopic files.  Impression and Plan: Penny [name] is a 34 y.o. female with past medical history of ADHD, anxiety, depression, sphincter of Oddi dysfunction, fibromyalgia, lupus, seizures, and chronic abdominal pain who presents for follow up of abdominal pain.  The patient has presented exacerbation of her chronic right upper quadrant abdominal pain which has been thoroughly evaluated in the past.  Endoscopic procedures have been performed for this, as well as evaluation with abdominal imaging previously.  It was considered that her symptoms were possibly related to central Oddi dysfunction but she actually did not have any improvement after endoscopic retrograde cholangiopancreatography was done and this was complicated with post ERCP pancreatitis.  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  We will perform a right upper quadrant ultrasound as part of evaluation for her pain, although I consider this to be less likely to be abnormal.  We will also evaluate her liver enzymes with a CMP and CBC.  If the patient were to have severe pain, she will need to go to the ER to have an urgent a comprehensive fast evaluation.  She would benefit from the use of Linzess as it has a anti nociceptive effect  which could improve her symptoms better than the Trulance.  I also emphasized to her the importance of tapering her opiates as this can increase her bowel hypersensitivity.  Patient understood and agreed.  - Schedule liver US - Stop Trulance - Start Linzess 145 mcg qday - Check CBC and CMP - Try to wean opiates as much as possible, follow with pain management  All questions were answered.      Total length of visit: 35 minutes  Penny Runkles Castaneda Mayorga, MD Gastroenterology and Hepatology [facility]  

## 2020-12-26 ENCOUNTER — Other Ambulatory Visit: Payer: Self-pay | Admitting: Nurse Practitioner

## 2020-12-26 MED ORDER — FLUCONAZOLE 150 MG PO TABS
ORAL_TABLET | ORAL | 0 refills | Status: DC
Start: 2020-12-26 — End: 2021-01-29

## 2020-12-29 ENCOUNTER — Telehealth (INDEPENDENT_AMBULATORY_CARE_PROVIDER_SITE_OTHER): Payer: Self-pay | Admitting: Gastroenterology

## 2020-12-29 ENCOUNTER — Other Ambulatory Visit (INDEPENDENT_AMBULATORY_CARE_PROVIDER_SITE_OTHER): Payer: Self-pay

## 2020-12-29 DIAGNOSIS — R109 Unspecified abdominal pain: Secondary | ICD-10-CM

## 2020-12-29 DIAGNOSIS — G8929 Other chronic pain: Secondary | ICD-10-CM

## 2020-12-29 DIAGNOSIS — Z79891 Long term (current) use of opiate analgesic: Secondary | ICD-10-CM

## 2020-12-29 DIAGNOSIS — D508 Other iron deficiency anemias: Secondary | ICD-10-CM

## 2020-12-29 NOTE — Telephone Encounter (Signed)
Per Serena Colonel she has submitted this to [facility] and the patient is aware.

## 2020-12-29 NOTE — Telephone Encounter (Signed)
Patient left voice mail message - stated she would like for her lab orders to be sent to [facility] on [facility] - please advise - ph# [phone]

## 2020-12-31 ENCOUNTER — Encounter: Payer: Self-pay | Admitting: Family Medicine

## 2021-01-01 ENCOUNTER — Other Ambulatory Visit: Payer: Self-pay

## 2021-01-01 ENCOUNTER — Ambulatory Visit (HOSPITAL_COMMUNITY)
Admission: RE | Admit: 2021-01-01 | Discharge: 2021-01-01 | Disposition: A | Discharge status: Home / Self Care | Payer: Medicaid Other | Source: Ambulatory Visit | Attending: Gastroenterology | Admitting: Gastroenterology

## 2021-01-01 ENCOUNTER — Telehealth (INDEPENDENT_AMBULATORY_CARE_PROVIDER_SITE_OTHER): Payer: Self-pay | Admitting: Internal Medicine

## 2021-01-01 DIAGNOSIS — R109 Unspecified abdominal pain: Secondary | ICD-10-CM | POA: Insufficient documentation

## 2021-01-01 DIAGNOSIS — G8929 Other chronic pain: Secondary | ICD-10-CM | POA: Diagnosis not present

## 2021-01-01 DIAGNOSIS — K760 Fatty (change of) liver, not elsewhere classified: Secondary | ICD-10-CM | POA: Diagnosis not present

## 2021-01-01 DIAGNOSIS — R4182 Altered mental status, unspecified: Secondary | ICD-10-CM | POA: Diagnosis not present

## 2021-01-01 DIAGNOSIS — R55 Syncope and collapse: Secondary | ICD-10-CM | POA: Diagnosis not present

## 2021-01-01 DIAGNOSIS — M2550 Pain in unspecified joint: Secondary | ICD-10-CM | POA: Diagnosis not present

## 2021-01-01 DIAGNOSIS — R200 Anesthesia of skin: Secondary | ICD-10-CM | POA: Diagnosis not present

## 2021-01-01 DIAGNOSIS — R5383 Other fatigue: Secondary | ICD-10-CM | POA: Diagnosis not present

## 2021-01-01 NOTE — Telephone Encounter (Signed)
Patient was called on her cell phone. She did not answer Message/results of ultrasound left on answering service. Further recommendations by Dr. Jenetta Downer.

## 2021-01-02 ENCOUNTER — Encounter: Payer: Self-pay | Admitting: Family Medicine

## 2021-01-02 ENCOUNTER — Telehealth (INDEPENDENT_AMBULATORY_CARE_PROVIDER_SITE_OTHER): Payer: Medicaid Other | Admitting: Psychiatry

## 2021-01-02 ENCOUNTER — Encounter (HOSPITAL_COMMUNITY): Payer: Self-pay | Admitting: Psychiatry

## 2021-01-02 ENCOUNTER — Ambulatory Visit (INDEPENDENT_AMBULATORY_CARE_PROVIDER_SITE_OTHER): Payer: Medicaid Other | Admitting: Family Medicine

## 2021-01-02 VITALS — BP 120/84 | Temp 98.0°F | Wt 162.4 lb

## 2021-01-02 DIAGNOSIS — R519 Headache, unspecified: Secondary | ICD-10-CM | POA: Diagnosis not present

## 2021-01-02 DIAGNOSIS — E46 Unspecified protein-calorie malnutrition: Secondary | ICD-10-CM

## 2021-01-02 DIAGNOSIS — F332 Major depressive disorder, recurrent severe without psychotic features: Secondary | ICD-10-CM

## 2021-01-02 DIAGNOSIS — F900 Attention-deficit hyperactivity disorder, predominantly inattentive type: Secondary | ICD-10-CM | POA: Diagnosis not present

## 2021-01-02 DIAGNOSIS — E639 Nutritional deficiency, unspecified: Secondary | ICD-10-CM | POA: Diagnosis not present

## 2021-01-02 DIAGNOSIS — F411 Generalized anxiety disorder: Secondary | ICD-10-CM | POA: Diagnosis not present

## 2021-01-02 DIAGNOSIS — E8809 Other disorders of plasma-protein metabolism, not elsewhere classified: Secondary | ICD-10-CM | POA: Diagnosis not present

## 2021-01-02 DIAGNOSIS — R410 Disorientation, unspecified: Secondary | ICD-10-CM

## 2021-01-02 LAB — COMPREHENSIVE METABOLIC PANEL
ALT: 45 IU/L — ABNORMAL HIGH (ref 0–32)
AST: 36 IU/L (ref 0–40)
Albumin/Globulin Ratio: 1.3 (ref 1.2–2.2)
Albumin: 3.6 g/dL — ABNORMAL LOW (ref 3.8–4.8)
Alkaline Phosphatase: 82 IU/L (ref 44–121)
BUN/Creatinine Ratio: 15 (ref 9–23)
BUN: 10 mg/dL (ref 6–20)
Bilirubin Total: 0.6 mg/dL (ref 0.0–1.2)
CO2: 24 mmol/L (ref 20–29)
Calcium: 9.3 mg/dL (ref 8.7–10.2)
Chloride: 99 mmol/L (ref 96–106)
Creatinine, Ser: 0.67 mg/dL (ref 0.57–1.00)
GFR calc Af Amer: 134 mL/min/{1.73_m2} (ref >59)
GFR calc non Af Amer: 116 mL/min/{1.73_m2} (ref >59)
Globulin, Total: 2.7 g/dL (ref 1.5–4.5)
Glucose: 90 mg/dL (ref 65–99)
Potassium: 4.7 mmol/L (ref 3.5–5.2)
Sodium: 137 mmol/L (ref 134–144)
Total Protein: 6.3 g/dL (ref 6.0–8.5)

## 2021-01-02 LAB — CBC WITH DIFFERENTIAL/PLATELET
Basophils Absolute: 0 10*3/uL (ref 0.0–0.2)
Basos: 0 %
EOS (ABSOLUTE): 0.4 10*3/uL (ref 0.0–0.4)
Eos: 7 %
Hematocrit: 34.3 % (ref 34.0–46.6)
Hemoglobin: 11.1 g/dL (ref 11.1–15.9)
Immature Grans (Abs): 0 10*3/uL (ref 0.0–0.1)
Immature Granulocytes: 0 %
Lymphocytes Absolute: 1.7 10*3/uL (ref 0.7–3.1)
Lymphs: 31 %
MCH: 30.1 pg (ref 26.6–33.0)
MCHC: 32.4 g/dL (ref 31.5–35.7)
MCV: 93 fL (ref 79–97)
Monocytes Absolute: 0.8 10*3/uL (ref 0.1–0.9)
Monocytes: 15 %
Neutrophils Absolute: 2.6 10*3/uL (ref 1.4–7.0)
Neutrophils: 47 %
Platelets: 215 10*3/uL (ref 150–450)
RBC: 3.69 x10E6/uL — ABNORMAL LOW (ref 3.77–5.28)
RDW: 12.3 % (ref 11.7–15.4)
WBC: 5.4 10*3/uL (ref 3.4–10.8)

## 2021-01-02 LAB — TSH: TSH: 1.35 u[IU]/mL (ref 0.450–4.500)

## 2021-01-02 LAB — T4, FREE: Free T4: 1.27 ng/dL (ref 0.82–1.77)

## 2021-01-02 LAB — SEDIMENTATION RATE: Sed Rate: 17 mm/hr (ref 0–32)

## 2021-01-02 LAB — VITAMIN B12: Vitamin B-12: 1035 pg/mL (ref 232–1245)

## 2021-01-02 LAB — RHEUMATOID FACTOR: Rheumatoid fact SerPl-aCnc: <10 IU/mL (ref <14.0)

## 2021-01-02 LAB — C-REACTIVE PROTEIN: CRP: 2 mg/L (ref 0–10)

## 2021-01-02 MED ORDER — AMPHETAMINE-DEXTROAMPHET ER 30 MG PO CP24: 30.0000 mg | ORAL_CAPSULE | ORAL | 0 refills | Status: DC

## 2021-01-02 MED ORDER — BUSPIRONE HCL 10 MG PO TABS: 10.0000 mg | ORAL_TABLET | Freq: Three times a day (TID) | ORAL | 2 refills | Status: DC

## 2021-01-02 MED ORDER — TRAZODONE HCL 100 MG PO TABS: 200.0000 mg | ORAL_TABLET | Freq: Every day | ORAL | 2 refills | Status: DC

## 2021-01-02 MED ORDER — DULOXETINE HCL 60 MG PO CPEP: 60.0000 mg | ORAL_CAPSULE | Freq: Two times a day (BID) | ORAL | 2 refills | Status: DC

## 2021-01-02 MED ORDER — CLONAZEPAM 0.5 MG PO TABS: 0.5000 mg | ORAL_TABLET | Freq: Two times a day (BID) | ORAL | 2 refills | Status: DC

## 2021-01-02 NOTE — Progress Notes (Signed)
Virtual Visit via Video Note  I connected with Penny [name] on 01/02/21 at 10:40 AM EST by a video enabled telemedicine application and verified that I am speaking with the correct person using two identifiers.  Location: Patient: home Provider: home   I discussed the limitations of evaluation and management by telemedicine and the availability of in person appointments. The patient expressed understanding and agreed to proceed.     I discussed the assessment and treatment plan with the patient. The patient was provided an opportunity to ask questions and all were answered. The patient agreed with the plan and demonstrated an understanding of the instructions.   The patient was advised to call back or seek an in-person evaluation if the symptoms worsen or if the condition fails to improve as anticipated.  I provided 15 minutes of non-face-to-face time during this encounter.   [name], MD  [facility] MD/PA/NP OP Progress Note  01/02/2021 10:58 AM Penny [name]  MRN:  [id number]  Chief Complaint:  Chief Complaint    ADD; Anxiety; Depression; Follow-up     HPI: This patient is a 35 year old female who is now living alone in [place].  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  She has 2 children with whom she shares custody with their fathers. The patient returns for follow-up after 6 weeks.  She claims that she is having episodes of confusion difficulty finding words and easy forgetfulness.  She is very lucid and coherent when speaking with me right now.  She states that it gets worse later in the day when her Adderall XR wears off.  I noted that she is on 2 medications for pain-Belbuca and Dilaudid.  She states that she is working with pain management to help decrease the amount of Dilaudid per day.  I would suspect that these of the main reasons that she is having memory issues.  I did offer to increase Adderall XR to 30 mg and she agrees.  She denies serious depression  or suicidal ideation.  She is upset because her son no longer wants to stay with her and wants to stay with his father full-time.  They are trying to work this out from counseling.  Visit Diagnosis:    ICD-10-CM   1. GAD (generalized anxiety disorder)  F41.1   2. Severe episode of recurrent major depressive disorder, without psychotic features ([place])  F33.2   3. Attention deficit hyperactivity disorder (ADHD), predominantly inattentive type  F90.0     Past Psychiatric History: Hospitalized last year for depression  Past Medical History:  Past Medical History:  Diagnosis Date  . ADD (attention deficit disorder)   . Anemia 2011   2o to GASTRIC BYPASS  . Anginal pain ([place])   . Anxiety   . Asthma   . Blood transfusion without reported diagnosis   . Chronic daily headache   . Depression   . Depression   . Dysmenorrhea 09/18/2013  . Dysrhythmia   . Elevated liver enzymes JUL 2011ALK PHOS 111-127 AST  143-267 ALT  213-321T BILI 0.6  ALB  3.7-4.06 Jun 2011 ALK PHOS 118 AST 24 ALT 42 T BILI 0.4 ALB 3.9  . Encounter for drug rehabilitation    at behavioral health for opioid addiction about 4 years ago  . Family history of adverse reaction to anesthesia    'dad had to be kept on pump for breathing for morphine'  . Fatty liver   . Fibroid 01/18/2017  .  Fibromyalgia   . Gastric bypass status for obesity   . Gastritis JULY 2011  . Heart murmur   . 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 History of Holter monitoring   . Hx of opioid abuse ([place])    for about 4 years, about 4 years ago  . Interstitial cystitis   . Iron deficiency anemia 07/23/2010  . Irritable bowel syndrome 2012 DIARRHEA   JUN 2012 TTG IgA 14.9  . IUD FEB 2010  . Lupus ([place])   . Menorrhagia 07/18/2013  . Migraines   . Obesity (BMI 30-39.9) 2011 228 LBS BMI 36.8  . Ovarian cyst   . Patient desires pregnancy 09/18/2013  . Polyneuropathy   . PONV (postoperative nausea and vomiting) seizure  post-operatively   seizure following ankle surgery  . Potassium (K) deficiency   . Pregnant 12/25/2013  . Psychiatric pseudoseizure   . RLQ abdominal pain 07/18/2013  . Sciatica of left side 11/14/2014  . 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 Stress 09/18/2013    Past Surgical History:  Procedure Laterality Date  . BIOPSY  04/10/2015   Procedure: BIOPSY;  Surgeon: Daneil Dolin, MD;  Location: [facility];  Service: Endoscopy;;  . cath self every nite     for sodium bicarb injection (discontinued 2013)  . CHOLECYSTECTOMY  2005   biliary dyskinesia  . COLONOSCOPY  JUN 2012 ABD PN/DIARRHEA WITH PROPOFOL   NL COLON  . DILATION AND CURETTAGE OF UTERUS    . DILITATION & CURRETTAGE/HYSTROSCOPY WITH NOVASURE ABLATION N/A 03/24/2017   Procedure: DILATATION & CURETTAGE/HYSTEROSCOPY WITH NOVASURE ENDOMETRIAL ABLATION;  Surgeon: Jonnie Kind, MD;  Location: [facility];  Service: Gynecology;  Laterality: N/A;  . ESOPHAGEAL DILATION N/A 04/10/2015   Procedure: ESOPHAGEAL DILATION WITH 54FR MALONEY DILATOR;  Surgeon: Daneil Dolin, MD;  Location: [facility];  Service: Endoscopy;  Laterality: N/A;  . ESOPHAGOGASTRODUODENOSCOPY    . ESOPHAGOGASTRODUODENOSCOPY (EGD) WITH PROPOFOL N/A 04/10/2015   Procedure: ESOPHAGOGASTRODUODENOSCOPY (EGD) WITH PROPOFOL;  Surgeon: Daneil Dolin, MD;  [facility]: [facility];  Service: Endoscopy;  Laterality: N/A;  . ESOPHAGOGASTRODUODENOSCOPY (EGD) WITH PROPOFOL N/A 12/06/2016   Procedure: ESOPHAGOGASTRODUODENOSCOPY (EGD) WITH PROPOFOL;  Surgeon: Danie Binder, MD;  [facility];  Service: Endoscopy;  Laterality: N/A;  . ESOPHAGOGASTRODUODENOSCOPY (EGD) WITH PROPOFOL N/A 04/27/2019   Procedure: ESOPHAGOGASTRODUODENOSCOPY (EGD) WITH PROPOFOL;  Surgeon: Rogene Houston, MD;  [facility];  Service: Endoscopy;  Laterality: N/A;  730  . ESOPHAGOGASTRODUODENOSCOPY (EGD) WITH PROPOFOL N/A 08/31/2019   Procedure: ESOPHAGOGASTRODUODENOSCOPY (EGD) WITH PROPOFOL;  Surgeon:  Rogene Houston, MD;  [facility];  Service: Endoscopy;  Laterality: N/A;  . GAB  2007   in [place]-POUCH 5 CM  . GASTRIC BYPASS  06/2006  . HYSTEROSCOPY WITH D & C N/A 09/12/2014   Procedure: DILATATION AND CURETTAGE /HYSTEROSCOPY;  Surgeon: Jonnie Kind, MD;  [facility]: [facility];  Service: Gynecology;  Laterality: N/A;  . KYPHOPLASTY N/A 08/02/2018   Procedure: KYPHOPLASTY T12;  Surgeon: Melina Schools, MD;  Location: [facility];  Service: Orthopedics;  Laterality: N/A;  60 mins  . LAPAROSCOPIC TUBAL LIGATION Bilateral 03/24/2017   Procedure: LAPAROSCOPIC TUBAL LIGATION ([place] Rings);  Surgeon: Jonnie Kind, MD;  Location: [facility];  Service: Gynecology;  Laterality: Bilateral;  . REPAIR VAGINAL CUFF N/A 07/30/2014   Procedure: REPAIR VAGINAL CUFF;  Surgeon: Mora Bellman, MD;  Location: [facility];  Service: Gynecology;  Laterality: N/A;  . SAVORY DILATION  06/20/2012   Dr. Barnie Alderman gastritis/Ulcer in the mid jejunum. Empiric dilation.   . SURAL NERVE BX Left 02/25/2016   Procedure: LEFT SURAL NERVE BIOPSY;  Surgeon: Jovita Gamma, MD;  Location: [facility];  Service: Neurosurgery;  Laterality: Left;  Left sural nerve biopsy  . TONSILLECTOMY    . TONSILLECTOMY AND ADENOIDECTOMY    . UPPER GASTROINTESTINAL ENDOSCOPY  JULY 2011 NAUSEA-D125,V6, PH 25   Bx; GASTRITIS, POUCH-5 CM LONG  . WISDOM TOOTH EXTRACTION      Family Psychiatric History: see below  Family History:  Family History  Problem Relation Age of Onset  . Hemochromatosis Maternal Grandmother   . Migraines Maternal Grandmother   . Cancer Maternal Grandmother   . Breast cancer Maternal Grandmother   . Hypertension Father   . Diabetes Father   . Coronary artery disease Father   . Migraines Paternal Grandmother   . Breast cancer Paternal Grandmother   . Cancer Mother        breast  . Hemochromatosis Mother   . Breast cancer Mother   . Depression Mother   . Anxiety disorder Mother   . Coronary artery  disease Paternal Grandfather   . Anxiety disorder Brother   . Bipolar disorder Brother   . Healthy Daughter   . Healthy Son     Social History:  Social History   Socioeconomic History  . Marital status: Divorced    Spouse name: Not on file  . Number of children: 2  . Years of education: some college  . Highest education level: Some college, no degree  Occupational History  . Occupation: Applying for [place]: NOT EMPLOYED  Tobacco Use  . Smoking status: Former Smoker    Packs/day: 0.25    Years: 6.00    Pack years: 1.50    Types: Cigarettes    Start date: 05/2018  . Smokeless tobacco: Never Used  Vaping Use  . Vaping Use: Some days  Substance and Sexual Activity  . Alcohol use: Not Currently    Alcohol/week: 0.0 standard drinks  . Drug use: Not Currently  . Sexual activity: Yes    Partners: Male    Birth control/protection: Surgical, Condom    Comment: tubal and ablation  Other Topics Concern  . Not on file  Social History Narrative   Patient is right handed.   Patient drinks one cup of coffee daily.   She lives in a one-story home with her two children (33 year old son, 78 month old daughter).   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 Difficulty of Paying Living Expenses: Very hard  Food Insecurity: No Food Insecurity  . Worried About Charity fundraiser in the Last Year: Never true  . Ran Out of Food in the Last Year: Never true  Transportation Needs: No Transportation Needs  . Lack of Transportation (Medical): No  . Lack of Transportation (Non-Medical): No  Physical Activity: Inactive  . Days of Exercise per Week: 0 days  . Minutes of Exercise per Session: 0 min  Stress: Stress Concern Present  . Feeling of Stress : Very much  Social Connections: Moderately Isolated  . Frequency of Communication with  Friends and Family: More than three times a week  . Frequency of Social Gatherings with Friends and Family: More than three times  a week  . Attends Religious Services: More than 4 times per year  . Active Member of Clubs or Organizations: No  . Attends Archivist Meetings: Never  . Marital Status: Divorced    Allergies:  Allergies  Allergen Reactions  . Gabapentin Other (See Comments)    Pt states that she was unresponsive after taking this medication, but her vitals remained stable.    . Metoclopramide Hcl Anxiety and Other (See Comments)    Pt states that she felt like she was trapped in a box, and could not get out.  Pt also states that she had temporary loss of movement, weakness, and tingling.    . Other Itching, Rash and Hives    Please use paper tape Other reaction(s): Other (See Comments) Please use paper tape  . Tramadol Other (See Comments)    Seizures  . Ativan [Lorazepam] Other (See Comments)    combative  . Latex Itching, Rash and Other (See Comments)    Burns  . Lyrica [Pregabalin] Other (See Comments)    suicidal  . Trazodone And Nefazodone Other (See Comments)    Makes pt "like a zombie"  . Zofran [Ondansetron] Other (See Comments)    Migraines in high doses    . Iron Other (See Comments)    PATIENT IS INTOLERANT TO ORAL IRON , SHE DOES NOT ABSORB IT.    Marland Kitchen Sulfa Antibiotics Hives  . Butrans [Buprenorphine] Rash    Patch caused rash, didn't help with pain  . Nucynta [Tapentadol] Nausea And Vomiting  . Propofol Nausea And Vomiting  . Tape Itching, Rash and Other (See Comments)    Please use paper tape  . Toradol [Ketorolac Tromethamine] Anxiety    Metabolic Disorder Labs: Lab Results  Component Value Date   HGBA1C 5.7 (H) 01/15/2015   Lab Results  Component Value Date   PROLACTIN 80.0 (H) 05/09/2015   Lab Results  Component Value Date   CHOL 95 03/10/2012   TRIG 50 03/10/2012   HDL 38 (L) 03/10/2012   CHOLHDL 2.5 03/10/2012   VLDL 10  03/10/2012   LDLCALC 47 03/10/2012   Lab Results  Component Value Date   TSH 1.350 01/01/2021   TSH 2.616 08/25/2019    Therapeutic Level Labs: No results found for: LITHIUM Lab Results  Component Value Date   VALPROATE 66 06/23/2020   No components found for:  CBMZ  Current Medications: Current Outpatient Medications  Medication Sig Dispense Refill  . amphetamine-dextroamphetamine (ADDERALL XR) 30 MG 24 hr capsule Take 1 capsule (30 mg total) by mouth every morning. 30 capsule 0  . AIMOVIG 70 MG/ML SOAJ SMARTSIG:70 Milligram(s) SUB-Q Every 4 Weeks    . BELBUCA 150 MCG FILM Take 1 strip by mouth 2 (two) times daily.    . budesonide-formoterol (SYMBICORT) 160-4.5 MCG/ACT inhaler Inhale 2 puffs into the lungs 2 (two) times daily. 1 Inhaler 5  . busPIRone (BUSPAR) 10 MG tablet Take 1 tablet (10 mg total) by mouth 3 (three) times daily. 90 tablet 2  . cetirizine (ZYRTEC) 10 MG tablet Take 10 mg by mouth daily.    . clonazePAM (KLONOPIN) 0.5 MG tablet Take 1 tablet (0.5 mg total) by mouth 2 (two) times daily. 60 tablet 2  . DULoxetine (CYMBALTA) 60 MG capsule Take 1 capsule (60 mg total) by mouth 2 (two) times daily. 180 capsule 2  . fluconazole (DIFLUCAN) 150 MG tablet One po qd prn yeast infection; may repeat in 3-4 days if needed 2  tablet 0  . HYDROmorphone (DILAUDID) 2 MG tablet Take 2 tablets (4 mg total) by mouth every 12 (twelve) hours as needed for severe pain. Take 1-2 tablets po q 4 hrs prn pain no greater than 9 tablets/day 1 tablet 0  . linaclotide (LINZESS) 145 MCG CAPS capsule Take 1 capsule (145 mcg total) by mouth daily. 30 capsule 5  . ondansetron (ZOFRAN-ODT) 8 MG disintegrating tablet Take 1 tablet (8 mg total) by mouth every 8 (eight) hours as needed. 50 tablet 3  . pantoprazole (PROTONIX) 40 MG tablet TAKE 1 TABLET BY MOUTH TWICE DAILY BEFORE MEALS. (Patient taking differently: Take 40 mg by mouth 2 (two) times daily.) 60 tablet 3  . promethazine (PHENERGAN) 12.5 MG  tablet Take 1 tablet (12.5 mg total) by mouth every 8 (eight) hours as needed for nausea or vomiting. 30 tablet 0  . rOPINIRole (REQUIP) 1 MG tablet Take one tablet po each evening. 30 tablet 5  . traZODone (DESYREL) 100 MG tablet Take 2 tablets (200 mg total) by mouth at bedtime. 60 tablet 2  . valACYclovir (VALTREX) 1000 MG tablet TAKE 1 TABLET BY MOUTH DAILY. 90 tablet PRN   No current facility-administered medications for this visit.     Musculoskeletal: Strength & Muscle Tone: within normal limits Gait & Station: normal Patient leans: N/A  Psychiatric Specialty Exam: Review of Systems  Gastrointestinal: Positive for abdominal pain.  Psychiatric/Behavioral: The patient is nervous/anxious.   All other systems reviewed and are negative.   There were no vitals taken for this visit.There is no height or weight on file to calculate BMI.  General Appearance: Casual and Fairly Groomed  Eye Contact:  Good  Speech:  Clear and Coherent  Volume:  Normal  Mood:  Anxious  Affect:  Appropriate and Congruent  Thought Process:  Goal Directed  Orientation:  Full (Time, Place, and Person)  Thought Content: Rumination   Suicidal Thoughts:  No  Homicidal Thoughts:  No  Memory:  Immediate;   Fair Recent;   Fair Remote;   Fair  Judgement:  Fair  Insight:  Shallow  Psychomotor Activity:  Normal  Concentration:  Concentration: Fair and Attention Span: Fair  Recall:  [employer] of Knowledge: Good  Language: Good  Akathisia:  No  Handed:  Right  AIMS (if indicated): not done  Assets:  Communication Skills Desire for Improvement Physical Health Resilience Social Support Talents/Skills  ADL's:  Intact  Cognition: WNL  Sleep:  Good   Screenings: AIMS   Flowsheet Row Admission (Discharged) from 06/19/2020 in [facility]  AIMS Total Score 0    AUDIT   Flowsheet Row Patient Outreach Telephone from 07/14/2020 in [facility]  Coordination Admission (Discharged) from 06/19/2020 in [facility] ED to Hosp-Admission (Discharged) from 05/09/2015 in [facility]  Alcohol Use Disorder Identification Test Final Score (AUDIT) 1 1 0    GAD-7   Flowsheet Row Counselor from 12/11/2018 in [facility] Counselor from 02/07/2017 in [facility]  Total GAD-7 Score 19 17    PHQ2-9   Flowsheet Row Patient Outreach Telephone from 07/14/2020 in [facility] ED from 06/17/2020 in [facility] Counselor from 12/11/2018 in [facility] Counselor from 07/22/2017 in [facility] from 02/07/2017 in [facility]  PHQ-2 Total Score 4 4 6  3 2  PHQ-9 Total Score _0 Assessment and Plan: This patient is a 35 year old female with a history of depression anxiety ADHD borderline personality and insomnia.  She is not focusing as well and claiming to have short-term memory issues which I think are primarily functional but nevertheless I will increase Adderall XR to 30 mg every morning for focus.  She will continue Cymbalta 120 mg daily for depression, BuSpar 10 mg 3 times daily for anxiety clonazepam 0.5 mg twice daily for anxiety and trazodone 200 mg at bedtime for sleep.  She will return to see me in 4 weeks   [name], MD 01/02/2021, 10:58 AM

## 2021-01-02 NOTE — Progress Notes (Signed)
Subjective:    Patient ID: Penny [name], female    DOB: [birth year], 35 y.o.   MRN: [id number]  HPI Patient arrives to discuss spells of confusion. Very nice patient Having multiple different confusion spells over the past couple months 1 of these episodes happened around Thanksgiving when she was driving her car and suddenly she started not being aware of where she was and talking nonsense.  Family that was with her got her to pull over the car and then they took over driving from there.  The patient for several days seem to be out of it not really focused and not really on task.  Needless to say on top of all this the patient felt very out of sorts in view that something was not right she has had some headaches but no vomiting with it no blurred vision double vision or unilateral numbness or weakness  She had another 1 of these spells happened in December and it lasted for multiple days but then got better and then she had another one in January she relates the spells do scare her she does not feel that the spells occur with any type of major stress but she does state she has been somewhat stressed  In addition to this patient also relates a lot of fatigue dizziness feeling weak having some nausea and diarrhea issues seeing gastroenterology poor nutrition because of how she has been feeling him mild weight loss and losing weight in her face and at times she is concerned she is becoming emaciated  Review of Systems Please see above denies fever chills cough wheezing difficulty breathing    Objective:   Physical Exam Lungs clear respiratory rate normal heart regular pulse normal extremities no edema Blood pressure taken sitting and standing does show a drop       Assessment & Plan:  1. Confusion So with the confusion spells it is hard to know for certain what is going on here.  During these times she has intermittent atypical headaches difficulty thinking focusing in addition to this  sometimes even talking to people who are not even there.  She denies hearing voices.  We will need to do an MRI to rule out the possibility of a neurologic issue such as multi strokes tumor growth but also to we will connect with neurology to see if they would recommend her having EEG study  Certainly it is true that she is on multiple medications including some pretty high doses of pain medications from her pain management doctor as well as nerve medication from her psychiatrist.  I sympathize that the patient has some difficult medical issues but I have encouraged her to work with both of her specialist at tapering back on some of these controlled medicines.  I have also encouraged her to use a medication box in order to properly administer her medicines and to make sure that she has Narcan at home which she states she does plus also teach everyone at the house how to use it - MR Brain Wo Contrast  2. Severe headache Severe headache several different times over the past few months couple times these occurred at night no unilateral numbness or weakness but because of the severe nature of the migraines along with the confusional spells MRI of the brain recommend - MR Brain Wo Contrast  3. Poor nutrition Recent lab work shows low albumin also her blood pressure drops when she stands in my opinion this patient is not eating enough  or drinking enough I would recommend nutritional consultation and continue to follow-up with gastroenterology - Amb ref to Medical Nutrition Therapy-MNT  4. Hypoalbuminemia due to protein-calorie malnutrition ([place]) Please see above recommend increase protein with Ensure or boost recommend the patient do a better job of dietary measures see dietitian - Amb ref to Medical Nutrition Therapy-MNT  I am sympathetic to this patient I do think we need to make sure there is not a major underlying neurologic issue but I also feel that there could be just drug interaction going on  causing the spells I have advised the patient not to do any driving for now until she is doing better  Once again I encourage patient to meet with her pain medicine doctor and psychiatrist and a possible tapering down on some of her medicines.  A copy of these notes will be forwarded to the patient

## 2021-01-05 ENCOUNTER — Encounter: Payer: Self-pay | Admitting: Family Medicine

## 2021-01-06 ENCOUNTER — Encounter: Payer: Self-pay | Admitting: Family Medicine

## 2021-01-07 ENCOUNTER — Ambulatory Visit (INDEPENDENT_AMBULATORY_CARE_PROVIDER_SITE_OTHER): Payer: Medicaid Other | Admitting: Family Medicine

## 2021-01-07 ENCOUNTER — Other Ambulatory Visit: Payer: Self-pay

## 2021-01-07 DIAGNOSIS — R300 Dysuria: Secondary | ICD-10-CM

## 2021-01-07 DIAGNOSIS — R509 Fever, unspecified: Secondary | ICD-10-CM

## 2021-01-07 LAB — POCT URINALYSIS DIPSTICK
Protein, UA: POSITIVE — AB
Spec Grav, UA: 1.02 (ref 1.010–1.025)
Urobilinogen, UA: 0.2 E.U./dL
pH, UA: 5 (ref 5.0–8.0)

## 2021-01-07 MED ORDER — ONDANSETRON HCL 8 MG PO TABS: 8.0000 mg | ORAL_TABLET | Freq: Three times a day (TID) | ORAL | 3 refills | Status: DC | PRN

## 2021-01-07 MED ORDER — CEFPROZIL 500 MG PO TABS: 500.0000 mg | ORAL_TABLET | Freq: Two times a day (BID) | ORAL | 0 refills | Status: DC

## 2021-01-07 MED ORDER — PROMETHAZINE HCL 12.5 MG PO TABS: 12.5000 mg | ORAL_TABLET | Freq: Three times a day (TID) | ORAL | 0 refills | Status: DC | PRN

## 2021-01-07 NOTE — Progress Notes (Signed)
   Subjective:    Patient ID: Penny [name], female    DOB: Oct 23, 1986, 35 y.o.   MRN: [id number]  HPI Patient relates some fever and not feeling good and low energy.  Some nausea and vomiting some intermittent abdominal pain some slight dysuria Patient presents today with respiratory illness Number of days present-last Friday  Symptoms include-nausea/vomiting/weakness, short of breath, no taste, decreased appetite,   Presence of worrisome signs (severe shortness of breath, lethargy, etc.) - short of breath  Recent/current visit to urgent care or ER-no  Recent direct exposure to Covid-no  Any current Covid testing-no Review of Systems     Objective:   Physical Exam Lungs clear heart regular HEENT benign flanks nontender extremities no edema skin warm dry       Assessment & Plan:  Possible UTI Urine culture sent antibiotic prescribed Nausea medicine for probable viral illness Covid test taken warnings discussed follow-up if problems

## 2021-01-09 LAB — SARS-COV-2, NAA 2 DAY TAT

## 2021-01-09 LAB — URINE CULTURE: Organism ID, Bacteria: NO GROWTH

## 2021-01-09 LAB — SPECIMEN STATUS REPORT

## 2021-01-09 LAB — NOVEL CORONAVIRUS, NAA: SARS-CoV-2, NAA: NOT DETECTED

## 2021-01-12 ENCOUNTER — Other Ambulatory Visit (INDEPENDENT_AMBULATORY_CARE_PROVIDER_SITE_OTHER): Payer: Self-pay | Admitting: Gastroenterology

## 2021-01-12 ENCOUNTER — Telehealth (INDEPENDENT_AMBULATORY_CARE_PROVIDER_SITE_OTHER): Payer: Self-pay | Admitting: Gastroenterology

## 2021-01-12 ENCOUNTER — Other Ambulatory Visit (HOSPITAL_COMMUNITY): Payer: Self-pay | Admitting: Psychiatry

## 2021-01-12 MED ORDER — BREXPIPRAZOLE 2 MG PO TABS: ORAL_TABLET | ORAL | 2 refills | Status: DC

## 2021-01-12 NOTE — Telephone Encounter (Signed)
Patient called the office states she is still having nausea & vomiting with abdominal pain and acid reflux - states her intake consist of insure and Pedialyte - please advise - ph# [phone]

## 2021-01-12 NOTE — Telephone Encounter (Signed)
I called the patient discussed results.

## 2021-01-12 NOTE — Telephone Encounter (Signed)
Dr. Jenetta Downer - please review and make recommendation.

## 2021-01-12 NOTE — Telephone Encounter (Signed)
I spoke to the patient today regarding her recent CBC and CMP which showed mild limitation of the ALT in the low 40s and mild anemia of 11.1.  Their ultrasound showed findings suggestive of hepatic acidosis but no other alterations.  The patient states that she is not feeling well and is still having persistent epigastric pain, as well as nausea and vomiting even though she is taking both Phenergan and Zofran frequently.  States that she is only able to drink Ensure daily as her symptoms are very significant.  I explained to her that her symptoms are very likely related to her chronic opiate intake which could have led to significant dysmotility and opiate induced gastroparesis.  However, given her history of anastomotic ulcers we will proceed with an EGD, as well as a colonoscopy to rule out any lower gastrointestinal sources causing her symptoms.  I advised her to increase her dose of Linzess to 90 mcg/day as her bowel movements have not changed significantly since she started the medication.  She will also try taking pantoprazole twice a day to improve her heartburn.  Darius Bump, can you please schedule a EGD and colonoscopy in the next available slot? Dx: Anastomotic ulcer, abdominal pain. Room: 3.   Thanks,  [name], MD Gastroenterology and Hepatology [facility]

## 2021-01-13 ENCOUNTER — Encounter (INDEPENDENT_AMBULATORY_CARE_PROVIDER_SITE_OTHER): Payer: Self-pay

## 2021-01-13 ENCOUNTER — Encounter (HOSPITAL_COMMUNITY): Payer: Self-pay

## 2021-01-13 ENCOUNTER — Telehealth (INDEPENDENT_AMBULATORY_CARE_PROVIDER_SITE_OTHER): Payer: Self-pay | Admitting: Gastroenterology

## 2021-01-13 ENCOUNTER — Other Ambulatory Visit (INDEPENDENT_AMBULATORY_CARE_PROVIDER_SITE_OTHER): Payer: Self-pay

## 2021-01-13 NOTE — Patient Instructions (Signed)
Penny [name]  01/13/2021     @PREFPERIOPPHARMACY @   Your procedure is scheduled on  01/16/2021   Report to [facility] at  1200  P.M.   Call this number if you have problems the morning of surgery:  [phone]   Remember: Follow the diet and prep instructions given to you by the office.                  Take these medicines the morning of surgery with A SIP OF WATER               Adderall, belbuca or dilaudid(if needed), rexulti, buspar, zyrtec, clonazepam, cymbalta, protonix.            Use your inhaler before you come.    Please brush your teeth.  Do not wear jewelry, make-up or nail polish.  Do not wear lotions, powders, or perfumes, or deodorant.  Do not shave 48 hours prior to surgery.  Men may shave face and neck.  Do not bring valuables to the hospital.  [facility] is not responsible for any belongings or valuables.  Contacts, dentures or bridgework may not be worn into surgery.  Leave your suitcase in the car.  After surgery it may be brought to your room.  For patients admitted to the hospital, discharge time will be determined by your treatment team.  Patients discharged the day of surgery will not be allowed to drive home and must have someone with them for 24 hours.   Special instructions:  DO NOT smoke tobacco or vape the morning of your procedure.  Please read over the following fact sheets that you were given. Anesthesia Post-op Instructions and Care and Recovery After Surgery       Colonoscopy, Adult, Care After This sheet gives you information about how to care for yourself after your procedure. Your health care provider may also give you more specific instructions. If you have problems or questions, contact your health care provider. What can I expect after the procedure? After the procedure, it is common to have:  A small amount of blood in your stool for 24 hours after the procedure.  Some gas.  Mild cramping or bloating of your  abdomen. Follow these instructions at home: Eating and drinking  Drink enough fluid to keep your urine pale yellow.  Follow instructions from your health care provider about eating or drinking restrictions.  Resume your normal diet as instructed by your health care provider. Avoid heavy or fried foods that are hard to digest.   Activity  Rest as told by your health care provider.  Avoid sitting for a long time without moving. Get up to take short walks every 1-2 hours. This is important to improve blood flow and breathing. Ask for help if you feel weak or unsteady.  Return to your normal activities as told by your health care provider. Ask your health care provider what activities are safe for you. Managing cramping and bloating  Try walking around when you have cramps or feel bloated.  Apply heat to your abdomen as told by your health care provider. Use the heat source that your health care provider recommends, such as a moist heat pack or a heating pad. ? Place a towel between your skin and the heat source. ? Leave the heat on for 20-30 minutes. ? Remove the heat if your skin turns bright red. This is especially important if you are unable to  feel pain, heat, or cold. You may have a greater risk of getting burned.   General instructions  If you were given a sedative during the procedure, it can affect you for several hours. Do not drive or operate machinery until your health care provider says that it is safe.  For the first 24 hours after the procedure: ? Do not sign important documents. ? Do not drink alcohol. ? Do your regular daily activities at a slower pace than normal. ? Eat soft foods that are easy to digest.  Take over-the-counter and prescription medicines only as told by your health care provider.  Keep all follow-up visits as told by your health care provider. This is important. Contact a health care provider if:  You have blood in your stool 2-3 days after the  procedure. Get help right away if you have:  More than a small spotting of blood in your stool.  Large blood clots in your stool.  Swelling of your abdomen.  Nausea or vomiting.  A fever.  Increasing pain in your abdomen that is not relieved with medicine. Summary  After the procedure, it is common to have a small amount of blood in your stool. You may also have mild cramping and bloating of your abdomen.  If you were given a sedative during the procedure, it can affect you for several hours. Do not drive or operate machinery until your health care provider says that it is safe.  Get help right away if you have a lot of blood in your stool, nausea or vomiting, a fever, or increased pain in your abdomen. This information is not intended to replace advice given to you by your health care provider. Make sure you discuss any questions you have with your health care provider. Document Revised: 11/16/2019 Document Reviewed: 06/18/2019 Elsevier Patient Education  2021 [place] After This sheet gives you information about how to care for yourself after your procedure. Your health care provider may also give you more specific instructions. If you have problems or questions, contact your health care provider. What can I expect after the procedure? After the procedure, it is common to have:  Tiredness.  Forgetfulness about what happened after the procedure.  Impaired judgment for important decisions.  Nausea or vomiting.  Some difficulty with balance. Follow these instructions at home: For the time period you were told by your health care provider:  Rest as needed.  Do not participate in activities where you could fall or become injured.  Do not drive or use machinery.  Do not drink alcohol.  Do not take sleeping pills or medicines that cause drowsiness.  Do not make important decisions or sign legal documents.  Do not take care of  children on your own.      Eating and drinking  Follow the diet that is recommended by your health care provider.  Drink enough fluid to keep your urine pale yellow.  If you vomit: ? Drink water, juice, or soup when you can drink without vomiting. ? Make sure you have little or no nausea before eating solid foods. General instructions  Have a responsible adult stay with you for the time you are told. It is important to have someone help care for you until you are awake and alert.  Take over-the-counter and prescription medicines only as told by your health care provider.  If you have sleep apnea, surgery and certain medicines can increase your risk for breathing problems.  Follow instructions from your health care provider about wearing your sleep device: ? Anytime you are sleeping, including during daytime naps. ? While taking prescription pain medicines, sleeping medicines, or medicines that make you drowsy.  Avoid smoking.  Keep all follow-up visits as told by your health care provider. This is important. Contact a health care provider if:  You keep feeling nauseous or you keep vomiting.  You feel light-headed.  You are still sleepy or having trouble with balance after 24 hours.  You develop a rash.  You have a fever.  You have redness or swelling around the IV site. Get help right away if:  You have trouble breathing.  You have new-onset confusion at home. Summary  For several hours after your procedure, you may feel tired. You may also be forgetful and have poor judgment.  Have a responsible adult stay with you for the time you are told. It is important to have someone help care for you until you are awake and alert.  Rest as told. Do not drive or operate machinery. Do not drink alcohol or take sleeping pills.  Get help right away if you have trouble breathing, or if you suddenly become confused. This information is not intended to replace advice given to you  by your health care provider. Make sure you discuss any questions you have with your health care provider. Document Revised: 08/07/2020 Document Reviewed: 10/25/2019 Elsevier Patient Education  2021 [employer].

## 2021-01-13 NOTE — Telephone Encounter (Signed)
I received a call from the patient today stating that she had severe abdominal pain to the point that it was causing her to break into tears.  She was also having significant pain in the left side of her flank.  She became very scared as after she move her bowels she saw a large amount of blood coming out.  The patient was very concerned about her symptoms and asked me what she would do as she was having very severe pain.  I spoke both with patient and the mother and explained them that then best thing would be for her to go to the ER to get a full examination by a physician and possibly abdominal imaging.  She may require to be admitted to the hospital due to her episodes of rectal bleeding.  Both the patient and the mother understood.  [name], MD Gastroenterology and Hepatology [facility]

## 2021-01-13 NOTE — Telephone Encounter (Signed)
Noted  

## 2021-01-13 NOTE — Telephone Encounter (Signed)
Penny [name] is scheduled in Rm 3 this Friday 01/16/21 and she is aware

## 2021-01-14 ENCOUNTER — Other Ambulatory Visit: Payer: Self-pay

## 2021-01-14 ENCOUNTER — Encounter (HOSPITAL_COMMUNITY): Payer: Self-pay

## 2021-01-14 ENCOUNTER — Other Ambulatory Visit (HOSPITAL_COMMUNITY)
Admission: RE | Admit: 2021-01-14 | Discharge: 2021-01-14 | Disposition: A | Discharge status: Home / Self Care | Payer: Medicaid Other | Source: Ambulatory Visit | Attending: Gastroenterology | Admitting: Gastroenterology

## 2021-01-14 ENCOUNTER — Encounter (HOSPITAL_COMMUNITY)
Admission: RE | Admit: 2021-01-14 | Discharge: 2021-01-14 | Disposition: A | Discharge status: Home / Self Care | Payer: Medicaid Other | Source: Ambulatory Visit | Attending: Gastroenterology | Admitting: Gastroenterology

## 2021-01-14 ENCOUNTER — Encounter: Payer: Medicaid Other | Attending: Family Medicine | Admitting: Nutrition

## 2021-01-14 VITALS — Ht 66.0 in | Wt 155.0 lb

## 2021-01-14 DIAGNOSIS — Z01812 Encounter for preprocedural laboratory examination: Secondary | ICD-10-CM | POA: Diagnosis not present

## 2021-01-14 DIAGNOSIS — Z20822 Contact with and (suspected) exposure to covid-19: Secondary | ICD-10-CM | POA: Diagnosis not present

## 2021-01-14 DIAGNOSIS — Z9884 Bariatric surgery status: Secondary | ICD-10-CM | POA: Insufficient documentation

## 2021-01-14 DIAGNOSIS — E440 Moderate protein-calorie malnutrition: Secondary | ICD-10-CM | POA: Insufficient documentation

## 2021-01-14 DIAGNOSIS — R634 Abnormal weight loss: Secondary | ICD-10-CM | POA: Insufficient documentation

## 2021-01-14 HISTORY — DX: Age-related osteoporosis without current pathological fracture: M81.0

## 2021-01-14 LAB — CBC WITH DIFFERENTIAL/PLATELET
Abs Immature Granulocytes: 0.01 10*3/uL (ref 0.00–0.07)
Basophils Absolute: 0 10*3/uL (ref 0.0–0.1)
Basophils Relative: 1 %
Eosinophils Absolute: 0.1 10*3/uL (ref 0.0–0.5)
Eosinophils Relative: 2 %
HCT: 35.4 % — ABNORMAL LOW (ref 36.0–46.0)
Hemoglobin: 11.3 g/dL — ABNORMAL LOW (ref 12.0–15.0)
Immature Granulocytes: 0 %
Lymphocytes Relative: 39 %
Lymphs Abs: 2.2 10*3/uL (ref 0.7–4.0)
MCH: 29.8 pg (ref 26.0–34.0)
MCHC: 31.9 g/dL (ref 30.0–36.0)
MCV: 93.4 fL (ref 80.0–100.0)
Monocytes Absolute: 0.5 10*3/uL (ref 0.1–1.0)
Monocytes Relative: 9 %
Neutro Abs: 2.8 10*3/uL (ref 1.7–7.7)
Neutrophils Relative %: 49 %
Platelets: 418 10*3/uL — ABNORMAL HIGH (ref 150–400)
RBC: 3.79 MIL/uL — ABNORMAL LOW (ref 3.87–5.11)
RDW: 12.8 % (ref 11.5–15.5)
WBC: 5.6 10*3/uL (ref 4.0–10.5)
nRBC: 0 % (ref 0.0–0.2)

## 2021-01-14 LAB — COMPREHENSIVE METABOLIC PANEL
ALT: 69 U/L — ABNORMAL HIGH (ref 0–44)
AST: 60 U/L — ABNORMAL HIGH (ref 15–41)
Albumin: 3.8 g/dL (ref 3.5–5.0)
Alkaline Phosphatase: 52 U/L (ref 38–126)
Anion gap: 7 (ref 5–15)
BUN: 17 mg/dL (ref 6–20)
CO2: 23 mmol/L (ref 22–32)
Calcium: 8.8 mg/dL — ABNORMAL LOW (ref 8.9–10.3)
Chloride: 104 mmol/L (ref 98–111)
Creatinine, Ser: 0.48 mg/dL (ref 0.44–1.00)
GFR, Estimated: >60 mL/min (ref >60)
Glucose, Bld: 126 mg/dL — ABNORMAL HIGH (ref 70–99)
Potassium: 4 mmol/L (ref 3.5–5.1)
Sodium: 134 mmol/L — ABNORMAL LOW (ref 135–145)
Total Bilirubin: 0.5 mg/dL (ref 0.3–1.2)
Total Protein: 6.8 g/dL (ref 6.5–8.1)

## 2021-01-14 LAB — PREGNANCY, URINE: Preg Test, Ur: NEGATIVE

## 2021-01-14 NOTE — Progress Notes (Signed)
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 Referral diagnosis: E63.9, E88.09  Preferred learning style:hands on.  Learning readiness:  change in progress   NUTRITION ASSESSMENT  Gastric bypass 2007- 309 lbs down to 175 lbs Broke her back in 3 places over the last 2 year. Limited with walking, not able to lift more than 5 lbs. 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 Having GI Issues-nausea, diarrhea, constipation, abdominal pain, No gallbladder,  ODI dysfunction  Anthropometrics  Wt Readings from Last 3 Encounters:  01/14/21 155 lb (70.3 kg)  01/02/21 162 lb 6.4 oz (73.7 kg)  12/25/20 151 lb (68.5 kg)   Ht Readings from Last 3 Encounters:  01/14/21 5\' 6"  (1.676 m)  12/25/20 5\' 6"  (1.676 m)  10/09/20 5\' 6"  (1.676 m)   Body mass index is 25.02 kg/m. @BMIFA @ Facility age limit for growth percentiles is 20 years. Facility age limit for growth percentiles is 20 years.    Clinical Medical Hx: Gastric bypass. PCM, Medications:  See chart Labs:  Lab Results  Component Value Date   HGBA1C 5.7 (H) 01/15/2015   CMP Latest Ref Rng & Units 01/14/2021 01/01/2021 08/20/2020  Glucose 70 - 99 mg/dL 638(V) 90 564(P)  BUN 6 - 20 mg/dL 17 10 9   Creatinine 0.44 - 1.00 mg/dL 3.29 5.18 8.41  Sodium 135 - 145 mmol/L 134(L) 137 136  Potassium 3.5 - 5.1 mmol/L 4.0 4.7 3.0(L)  Chloride 98 - 111 mmol/L 104 99 103  CO2 22 - 32 mmol/L 23 24 19(L)  Calcium 8.9 - 10.3 mg/dL 6.6(A) 9.3 6.3(K)  Total Protein 6.5 - 8.1 g/dL 6.8 6.3 -  Total Bilirubin 0.3 - 1.2 mg/dL 0.5 0.6 -  Alkaline Phos 38 - 126 U/L 52 82 -  AST 15 - 41 U/L 60(H) 36 -  ALT 0 - 44 U/L 69(H) 45(H) -   Lipid Panel     Component Value Date/Time   CHOL 95 03/10/2012 0642   TRIG 50 03/10/2012 0642   HDL 38 (L) 03/10/2012 0642   CHOLHDL 2.5 03/10/2012 0642   VLDL 10 03/10/2012 0642   LDLCALC 47 03/10/2012 0642    Notable  Signs/Symptoms: GI issues, weak, fatigue,   Lifestyle & Dietary Hx Gastric bypass in 2007, inconsistent meals. Hypoalbuminemia due to possible malabsorption from surgery and poor nutritional intake.  Hasn't been taking her vitamins post surgery.   Estimated daily fluid intake: 24 oz Supplements: hasn't been taking her bariatric vitamins Sleep: varies  Stress / self-care: some Current average weekly physical activity: not much   24-Hr Dietary Recall First Meal:Ensure 8  and Pedialyte 8 oz Snack: crackers and nabs Second Meal:Ensure,  Snack: protein bar, Pedialyte Third Meal: Probiotic drink, Snack:  Beverages: Ensure and pediatlyte, water   Estimated Energy Needs Calories: 1800-2000  Carbohydrate: 2500g Protein: 135g Fat: 50g   NUTRITION DIAGNOSIS  NI-5.11.1 Predicted suboptimal nutrient intake As related to poor food intake.  As evidenced by Hypoalbuminemia, .   NUTRITION INTERVENTION  Nutrition education (E-1) on the following topics:  GI Health, IBS, meal planning, low residue diet, timing of meals, beneift of non inflammatory food choices.  Handouts Provided Include emailed IBS handout My plate  Learning Style & Readiness for Change Teaching method utilized: Visual & Auditory  Demonstrated degree of understanding via: Teach Back  Barriers to learning/adherence to lifestyle change: None  Goals Established by Pt . Eat three balanced meals  per day or small frequent meals every 2-3 hours. . Cut out fried and processed foods       Drink only water    Get back to taking your bariatric vitamins as instructed at post op.    Increase quality proteins and nutrient dense foods    Avoid foods that cause GI upset.  MONITORING & EVALUATION Dietary intake, weekly physical activity, and weight  in 1 month  Next Steps  Patient is to keep a food journal to work on an elimination diet.Marland Kitchen

## 2021-01-15 ENCOUNTER — Ambulatory Visit (INDEPENDENT_AMBULATORY_CARE_PROVIDER_SITE_OTHER): Payer: Medicaid Other | Admitting: Clinical

## 2021-01-15 DIAGNOSIS — F411 Generalized anxiety disorder: Secondary | ICD-10-CM | POA: Diagnosis not present

## 2021-01-15 DIAGNOSIS — F332 Major depressive disorder, recurrent severe without psychotic features: Secondary | ICD-10-CM

## 2021-01-15 LAB — SARS CORONAVIRUS 2 (TAT 6-24 HRS): SARS Coronavirus 2: NEGATIVE

## 2021-01-15 NOTE — Progress Notes (Signed)
Virtual Visit via Video Note  I connected with Jurnei Smyser on 01/15/21 at  1:00 PM EST by a video enabled telemedicine application and verified that I am speaking with the correct person using two identifiers.  Location: Patient: Home Provider: Office   I discussed the limitations of evaluation and management by telemedicine and the availability of in person appointments. The patient expressed understanding and agreed to proceed.    THERAPIST PROGRESS NOTE  Session Time:1:00PM-1:55PM  Participation Level:Active  Behavioral Response:CasualAlertDepressedand NA  Type of Therapy:Individual Therapy  Treatment Goals addressed:Coping  Interventions:CBT, Motivational Interviewing, Strength-based and Supportive  Summary:Saranda Hennisis a33y.o.femalewho presents withGAD and MDD.The OPT therapist worked with thepatientfor herongoingOPT treatment session. The OPT therapist utilized Motivational Interviewing to assist in creating therapeutic repore. The patient in the session was engaged and work in Science writer about hertriggers and symptoms over the past few weeksincludinginteractions with family including a falling out over a miscommunication with her childs Father.The OPT therapist worked with the patient implementing elements of CBT.The OPT therapist provided psycho-education during the session.The OPT therapist worked with the patient onusing her supports and communicating her feelings and focusing in areas that she has control in.  Suicidal/Homicidal:Nowithout intent/plan  Therapist Response:The OPT therapist worked with the patient for the patientsscheduled session. The patient was engaged in hersession and gave feedback in relation to triggers, symptoms, and behavior responses over the pastfewweeks. The patienthas been having difficultymanaging symptoms of herGAD and MDDwhile adjusting to lviing with her children to an  apartment.The OPT therapist in [place] the patient and utilized elements of CBT in working with the patient to help empower the patient to set healthy boundaries and put her effort into areas she has control to make decisions about.The OPT therapist worked with the patient on, positive thinking, communication of her feelings and not allowing automatic negative thoughts dominate, and use hercoping strategies.  Plan: Return again in2/3weeks.  Diagnosis:Axis I:Depressionand GAD  Axis II:No diagnosis  I discussed the assessment and treatment plan with the patient. The patient was provided an opportunity to ask questions and all were answered. The patient agreed with the plan and demonstrated an understanding of the instructions.  The patient was advised to call back or seek an in-person evaluation if the symptoms worsen or if the condition fails to improve as anticipated.  I provided 27minutes of non-face-to-face time during this encounter.  Lennox Grumbles, LCSW  01/15/2021

## 2021-01-16 ENCOUNTER — Encounter (HOSPITAL_COMMUNITY): Payer: Self-pay | Admitting: Gastroenterology

## 2021-01-16 ENCOUNTER — Ambulatory Visit (HOSPITAL_COMMUNITY): Payer: Medicaid Other | Admitting: Certified Registered"

## 2021-01-16 ENCOUNTER — Encounter (HOSPITAL_COMMUNITY)
Admission: RE | Disposition: A | Discharge status: Home / Self Care | Payer: Self-pay | Source: Home / Self Care | Attending: Gastroenterology

## 2021-01-16 ENCOUNTER — Ambulatory Visit (HOSPITAL_COMMUNITY)
Admission: RE | Admit: 2021-01-16 | Discharge: 2021-01-16 | Disposition: A | Discharge status: Home / Self Care | Payer: Medicaid Other | Attending: Gastroenterology | Admitting: Gastroenterology

## 2021-01-16 DIAGNOSIS — M797 Fibromyalgia: Secondary | ICD-10-CM | POA: Insufficient documentation

## 2021-01-16 DIAGNOSIS — F909 Attention-deficit hyperactivity disorder, unspecified type: Secondary | ICD-10-CM | POA: Insufficient documentation

## 2021-01-16 DIAGNOSIS — Z79899 Other long term (current) drug therapy: Secondary | ICD-10-CM | POA: Diagnosis not present

## 2021-01-16 DIAGNOSIS — Z87891 Personal history of nicotine dependence: Secondary | ICD-10-CM | POA: Diagnosis not present

## 2021-01-16 DIAGNOSIS — Z888 Allergy status to other drugs, medicaments and biological substances status: Secondary | ICD-10-CM | POA: Insufficient documentation

## 2021-01-16 DIAGNOSIS — Z7951 Long term (current) use of inhaled steroids: Secondary | ICD-10-CM | POA: Insufficient documentation

## 2021-01-16 DIAGNOSIS — F419 Anxiety disorder, unspecified: Secondary | ICD-10-CM | POA: Diagnosis not present

## 2021-01-16 DIAGNOSIS — F32A Depression, unspecified: Secondary | ICD-10-CM | POA: Insufficient documentation

## 2021-01-16 DIAGNOSIS — Z882 Allergy status to sulfonamides status: Secondary | ICD-10-CM | POA: Diagnosis not present

## 2021-01-16 DIAGNOSIS — K649 Unspecified hemorrhoids: Secondary | ICD-10-CM | POA: Diagnosis not present

## 2021-01-16 DIAGNOSIS — K289 Gastrojejunal ulcer, unspecified as acute or chronic, without hemorrhage or perforation: Secondary | ICD-10-CM | POA: Insufficient documentation

## 2021-01-16 DIAGNOSIS — R112 Nausea with vomiting, unspecified: Secondary | ICD-10-CM | POA: Diagnosis not present

## 2021-01-16 DIAGNOSIS — R109 Unspecified abdominal pain: Secondary | ICD-10-CM

## 2021-01-16 DIAGNOSIS — K625 Hemorrhage of anus and rectum: Secondary | ICD-10-CM | POA: Diagnosis not present

## 2021-01-16 DIAGNOSIS — Z9884 Bariatric surgery status: Secondary | ICD-10-CM | POA: Insufficient documentation

## 2021-01-16 DIAGNOSIS — Z8616 Personal history of COVID-19: Secondary | ICD-10-CM | POA: Insufficient documentation

## 2021-01-16 DIAGNOSIS — K5900 Constipation, unspecified: Secondary | ICD-10-CM | POA: Insufficient documentation

## 2021-01-16 DIAGNOSIS — R1011 Right upper quadrant pain: Secondary | ICD-10-CM | POA: Diagnosis not present

## 2021-01-16 DIAGNOSIS — Z79891 Long term (current) use of opiate analgesic: Secondary | ICD-10-CM | POA: Insufficient documentation

## 2021-01-16 DIAGNOSIS — G8929 Other chronic pain: Secondary | ICD-10-CM | POA: Diagnosis not present

## 2021-01-16 DIAGNOSIS — F418 Other specified anxiety disorders: Secondary | ICD-10-CM | POA: Diagnosis not present

## 2021-01-16 HISTORY — PX: ESOPHAGOGASTRODUODENOSCOPY (EGD) WITH PROPOFOL: SHX5813

## 2021-01-16 HISTORY — PX: COLONOSCOPY WITH PROPOFOL: SHX5780

## 2021-01-16 SURGERY — COLONOSCOPY WITH PROPOFOL
Anesthesia: General

## 2021-01-16 MED ORDER — KETAMINE HCL 50 MG/5ML IJ SOSY
PREFILLED_SYRINGE | INTRAMUSCULAR | Status: AC
  Filled 2021-01-16: qty 5

## 2021-01-16 MED ORDER — PROPOFOL 10 MG/ML IV BOLUS
INTRAVENOUS | Status: AC
  Filled 2021-01-16: qty 20

## 2021-01-16 MED ORDER — PROPOFOL 10 MG/ML IV BOLUS
INTRAVENOUS | Status: AC
  Filled 2021-01-16: qty 60

## 2021-01-16 MED ORDER — DEXMEDETOMIDINE (PRECEDEX) IN NS 20 MCG/5ML (4 MCG/ML) IV SYRINGE
PREFILLED_SYRINGE | INTRAVENOUS | Status: AC
  Filled 2021-01-16: qty 10

## 2021-01-16 MED ORDER — STERILE WATER FOR IRRIGATION IR SOLN
Status: DC | PRN
  Administered 2021-01-16: 100 mL

## 2021-01-16 MED ORDER — DEXMEDETOMIDINE (PRECEDEX) IN NS 20 MCG/5ML (4 MCG/ML) IV SYRINGE
PREFILLED_SYRINGE | INTRAVENOUS | Status: DC | PRN
  Administered 2021-01-16: 20 ug via INTRAVENOUS

## 2021-01-16 MED ORDER — LACTATED RINGERS IV SOLN
INTRAVENOUS | Status: DC
  Administered 2021-01-16: 1000 mL via INTRAVENOUS

## 2021-01-16 MED ORDER — ONDANSETRON HCL 4 MG/2ML IJ SOLN
INTRAMUSCULAR | Status: DC | PRN
  Administered 2021-01-16: 4 mg via INTRAVENOUS

## 2021-01-16 MED ORDER — LANSOPRAZOLE 30 MG PO CPDR: 30.0000 mg | DELAYED_RELEASE_CAPSULE | Freq: Two times a day (BID) | ORAL | 0 refills | Status: DC

## 2021-01-16 MED ORDER — KETAMINE HCL 10 MG/ML IJ SOLN
INTRAMUSCULAR | Status: DC | PRN
  Administered 2021-01-16: 20 mg via INTRAVENOUS
  Administered 2021-01-16: 30 mg via INTRAVENOUS

## 2021-01-16 MED ORDER — MIDAZOLAM HCL 2 MG/2ML IJ SOLN
INTRAMUSCULAR | Status: AC
  Filled 2021-01-16: qty 2

## 2021-01-16 MED ORDER — PROPOFOL 10 MG/ML IV BOLUS
INTRAVENOUS | Status: DC | PRN
  Administered 2021-01-16: 200 mg via INTRAVENOUS
  Administered 2021-01-16: 100 mg via INTRAVENOUS

## 2021-01-16 MED ORDER — MIDAZOLAM HCL 2 MG/2ML IJ SOLN
INTRAMUSCULAR | Status: DC | PRN
  Administered 2021-01-16: 2 mg via INTRAVENOUS

## 2021-01-16 MED ORDER — SUCRALFATE 1 G PO TABS: 1.0000 g | ORAL_TABLET | Freq: Four times a day (QID) | ORAL | 0 refills | Status: DC

## 2021-01-16 MED ORDER — LIDOCAINE HCL (CARDIAC) PF 100 MG/5ML IV SOSY
PREFILLED_SYRINGE | INTRAVENOUS | Status: DC | PRN
  Administered 2021-01-16 (×2): 50 mg via INTRAVENOUS

## 2021-01-16 MED ORDER — PROPOFOL 500 MG/50ML IV EMUL
INTRAVENOUS | Status: DC | PRN
  Administered 2021-01-16: 300 ug/kg/min via INTRAVENOUS
  Administered 2021-01-16: 200 ug/kg/min via INTRAVENOUS

## 2021-01-16 MED ORDER — ONDANSETRON HCL 4 MG/2ML IJ SOLN
INTRAMUSCULAR | Status: AC
  Filled 2021-01-16: qty 2

## 2021-01-16 NOTE — Interval H&P Note (Signed)
History and Physical Interval Note:  01/16/2021 1:57 PM  Penny [name] is a 35 y.o. 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  Patient has presented severe abdominal pain, mostly in the right upper quadrant.  She has not been able to tolerate the pain despite taking Dilaudid at high doses orally.  Has also presented persistent nausea and vomiting episodes.  Has presented new onset rectal bleeding this week which she did not have before.  Does not know if she has had any more episodes since yesterday.  Has not been tolerating oral intake adequately.  BP 128/89   Temp 98 F (36.7 C) (Oral)   Resp 18   SpO2 100%  GENERAL: The patient is AO x3, in no acute distress. HEENT: Head is normocephalic and atraumatic. EOMI are intact. Mouth is well hydrated and without lesions. NECK: Supple. No masses LUNGS: Clear to auscultation. No presence of rhonchi/wheezing/rales. Adequate chest expansion HEART: RRR, normal s1 and s2. ABDOMEN: Soft, nontender, no guarding, no peritoneal signs, and nondistended. BS +. No masses. EXTREMITIES: Without any cyanosis, clubbing, rash, lesions or edema. NEUROLOGIC: AOx3, no focal motor deficit. SKIN: no jaundice, no rashes  [name]  has presented today for surgery, with the diagnosis of Anastomotic Ulcer, Abdominal Pain.  The various methods of treatment have been discussed with the patient and family. After consideration of risks, benefits and other options for treatment, the patient has consented to  Procedure(s) with comments: COLONOSCOPY WITH PROPOFOL (N/A) - 1:45 ESOPHAGOGASTRODUODENOSCOPY (EGD) WITH PROPOFOL (N/A) as a surgical intervention.  The patient's history has been reviewed,  patient examined, no change in status, stable for surgery.  I have reviewed the patient's chart and labs.  Questions were answered to the patient's satisfaction.     [name] [place]

## 2021-01-16 NOTE — Op Note (Signed)
[facility] Patient Name: Penny [name] Procedure Date: 01/16/2021 2:42 PM MRN: [id number] Date of Birth: 11-05-1986 Attending MD: [name] ,  CSN: [id number] Age: 34 Admit Type: Outpatient Procedure:                Colonoscopy Indications:              Abdominal pain, Rectal bleeding Providers:                [name], Crystal Page, Randa Spike,                            Technician Referring MD:              Medicines:                Monitored Anesthesia Care Complications:            No immediate complications. Estimated Blood Loss:     Estimated blood loss: none. Procedure:                Pre-Anesthesia Assessment:                           - The risks and benefits of the procedure and the                            sedation options and risks were discussed with the                            patient. All questions were answered and informed                            consent was obtained.                           - Prior to the procedure, a History and Physical                            was performed, and patient medications, allergies                            and sensitivities were reviewed. The patient's                            tolerance of previous anesthesia was reviewed.                           - ASA Grade Assessment: II - A patient with mild                            systemic disease.                           After obtaining informed consent, the colonoscope                            was passed under direct vision. Throughout the  procedure, the patient's blood pressure, pulse, and                            oxygen saturations were monitored continuously. The                            [id number]-[id number] ([id number]) scope was introduced through                            the anus and advanced to the the cecum, identified                            by appendiceal orifice and ileocecal valve. The                             patient tolerated the procedure well. The                            colonoscopy was technically difficult and complex                            due to inadequate bowel prep. The quality of the                            bowel preparation was inadequate. Scope withdrawal                            time was 7 minutes. Scope In: 2:44:32 PM Scope Out: 3:08:34 PM Scope Withdrawal Time: 0 hours 8 minutes 12 seconds  Total Procedure Duration: 0 hours 24 minutes 2 seconds  Findings:      Hemorrhoids were found on perianal exam.      Semi-solid stool was found in the entire colon, making visualization       difficult.      The colon (entire examined portion) appeared normal, but thorough       mucosal evaluation was not adequate given prep quality. However, no       large masses/polyps or fresh/old blood was observed.      The retroflexed view of the distal rectum and anal verge was normal and       showed no anal or rectal abnormalities. Impression:               - Preparation of the colon was inadequate.                           - Hemorrhoids found on perianal exam.                           - Stool in the entire examined colon. No large                            masses/polyps or fresh/old blood was observed.                           - The distal rectum and anal verge  are normal on                            retroflexion view.                           - No specimens collected. Moderate Sedation:      Per Anesthesia Care Recommendation:           - Discharge patient to home (ambulatory).                           - Resume previous diet.                           - Repeat colonoscopy at age 51 for screening                            purposes.                           - Can use preparation H or Anusol for rectal                            bleeding episodes, use Miralax daily to keep bowel                            movements soft. Procedure Code(s):        --- Professional ---                            [phone], Colonoscopy, flexible; diagnostic, including                            collection of specimen(s) by brushing or washing,                            when performed (separate procedure) Diagnosis Code(s):        --- Professional ---                           K64.9, Unspecified hemorrhoids                           R10.9, Unspecified abdominal pain                           K62.5, Hemorrhage of anus and rectum CPT copyright 2019 American Medical Association. All rights reserved. The codes documented in this report are preliminary and upon coder review may  be revised to meet current compliance requirements. [name], MD [name],  01/16/2021 3:30:12 PM This report has been signed electronically. Number of Addenda: 0

## 2021-01-16 NOTE — Anesthesia Preprocedure Evaluation (Addendum)
Anesthesia Evaluation  Patient identified by MRN, date of birth, ID band Patient awake    Reviewed: Allergy & Precautions, H&P , NPO status , Patient's Chart, lab work & pertinent test results, reviewed documented beta blocker date and time   History of Anesthesia Complications (+) PONV and history of anesthetic complications  Airway Mallampati: II  TM Distance: >3 FB Neck ROM: full    Dental no notable dental hx. (+) Teeth Intact, Dental Advisory Given   Pulmonary shortness of breath, asthma , former smoker,    Pulmonary exam normal breath sounds clear to auscultation       Cardiovascular Exercise Tolerance: Good hypertension, + angina  Rhythm:regular Rate:Normal     Neuro/Psych  Headaches, Seizures -,  PSYCHIATRIC DISORDERS Anxiety Depression  Neuromuscular disease    GI/Hepatic Neg liver ROS, GERD  Medicated,  Endo/Other  negative endocrine ROS  Renal/GU negative Renal ROS  negative genitourinary   Musculoskeletal   Abdominal   Peds  Hematology  (+) Blood dyscrasia, anemia ,   Anesthesia Other Findings   Reproductive/Obstetrics negative OB ROS                            Anesthesia Physical Anesthesia Plan  ASA: III  Anesthesia Plan: General   Post-op Pain Management:    Induction:   PONV Risk Score and Plan: Propofol infusion  Airway Management Planned:   Additional Equipment:   Intra-op Plan:   Post-operative Plan:   Informed Consent: I have reviewed the patients History and Physical, chart, labs and discussed the procedure including the risks, benefits and alternatives for the proposed anesthesia with the patient or authorized representative who has indicated his/her understanding and acceptance.     Dental Advisory Given  Plan Discussed with: CRNA  Anesthesia Plan Comments:         Anesthesia Quick Evaluation

## 2021-01-16 NOTE — Anesthesia Postprocedure Evaluation (Signed)
Anesthesia Post Note  [name]  Procedure(s) Performed: COLONOSCOPY WITH PROPOFOL (N/A ) ESOPHAGOGASTRODUODENOSCOPY (EGD) WITH PROPOFOL (N/A )  Patient location during evaluation: PACU Anesthesia Type: General Level of consciousness: awake Pain management: pain level controlled Vital Signs Assessment: post-procedure vital signs reviewed and stable Respiratory status: spontaneous breathing Cardiovascular status: blood pressure returned to baseline Postop Assessment: no headache Anesthetic complications: no   No complications documented.   Last Vitals:  Vitals:   01/16/21 1530 01/16/21 1607  BP: (!) 125/91 (!) 124/91  Pulse: (!) 50 88  Resp: 17 16  Temp:  36.5 C  SpO2: 99% 99%    Last Pain:  Vitals:   01/16/21 1607  TempSrc: Oral  PainSc: 0-No pain                 Louann Sjogren

## 2021-01-16 NOTE — OR Nursing (Signed)
Additional EGD scope times IN: 1433

## 2021-01-16 NOTE — Anesthesia Procedure Notes (Signed)
Date/Time: 01/16/2021 2:06 PM Performed by: Orlie Dakin, CRNA Pre-anesthesia Checklist: Emergency Drugs available, Patient identified, Suction available and Patient being monitored Patient Re-evaluated:Patient Re-evaluated prior to induction Oxygen Delivery Method: Nasal cannula Induction Type: IV induction Placement Confirmation: positive ETCO2

## 2021-01-16 NOTE — Op Note (Signed)
[facility] Patient Name: Penny [name] Procedure Date: 01/16/2021 1:47 PM MRN: [id number] Date of Birth: [birth year] Attending MD: [name] ,  CSN: [id number] Age: 35 Admit Type: Outpatient Procedure:                Upper GI endoscopy Indications:              Abdominal pain in the right upper quadrant, Nausea                            with vomiting Providers:                [name], Crystal Page, Randa Spike,                            Technician Referring MD:              Medicines:                Monitored Anesthesia Care Complications:            No immediate complications. Estimated Blood Loss:     Estimated blood loss: none. Procedure:                Pre-Anesthesia Assessment:                           - The risks and benefits of the procedure and the                            sedation options and risks were discussed with the                            patient. All questions were answered and informed                            consent was obtained.                           - Prior to the procedure, a History and Physical                            was performed, and patient medications, allergies                            and sensitivities were reviewed. The patient's                            tolerance of previous anesthesia was reviewed.                           - ASA Grade Assessment: II - A patient with mild                            systemic disease.                           After obtaining informed consent, the endoscope was  passed under direct vision. Throughout the                            procedure, the patient's blood pressure, pulse, and                            oxygen saturations were monitored continuously. The                            [id number]) was introduced through the mouth,                            and advanced to the jejunum. The upper GI endoscopy                            was  performed with difficulty due to IV                            infiltration. Successful completion of the                            procedure was aided by withdrawing the scope and                            placing a new IV. The patient tolerated the                            procedure fairly well. Scope In: 2:02:59 PM Scope Out: 2:39:38 PM Total Procedure Duration: 0 hours 36 minutes 39 seconds  Findings:      The examined esophagus was normal.      Evidence of a gastric bypass was found. A gastric pouch with a normal       size was found containing scant amount of hematin. The pouch seemed to       be torsioned to certain degree but normal mucosal pattern was seen. The       gastrojejunal anastomosis was characterized by ulceration at the site of       the anastomosis. This was traversed. The pouch-to-jejunum limb was       characterized by ulceration in the intestinal side, which had some scant       yellowish debris overlying it. The jejunojejunal anastomosis was       characterized by healthy appearing mucosa.      The rest of the examined jejunum was normal. Impression:               - Normal esophagus.                           - Gastric bypass with a normal-sized pouch.                            Gastrojejunal anastomosis characterized by                            ulceration at the anastomosis and marginal  ulceration upon traversing the pouch.                           - Normal examined jejunum.                           - No specimens collected. Moderate Sedation:      Per Anesthesia Care Recommendation:           - Discharge patient to home (ambulatory).                           - Resume previous diet.                           - Use sucralfate suspension 1 gram PO QID for 4                            weeks.                           - Use Prevacid (lansoprazole) 30 mg PO BID for 3                            months - open capsule and swallow  granules directly.                           - Avoid smoking or vaping.                           - Return to clinic in 4-6 weeks.                           - No ibuprofen, naproxen, or other non-steroidal                            anti-inflammatory drugs. 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 All rights reserved. The codes documented in this report are preliminary and upon coder review may  be revised to meet current compliance requirements. [name], MD [name],  01/16/2021 3:22:22 PM This report has been signed electronically. Number of Addenda: 0

## 2021-01-16 NOTE — Discharge Instructions (Signed)
You are being discharged to home.  Resume your previous diet.  Take Carafate (sucralfate) suspension 1 gram by mouth four times a day for four weeks.  Take Prevacid (lansoprazole) 30 mg by mouth twice a day for three months - open capsule and swallow granules directly. Avoid smoking or vaping. Return to clinic in 4-6 weeks.  Do not take any ibuprofen (including Advil, Motrin or Nuprin), naproxen, or other non-steroidal anti-inflammatory drugs.  Your physician has recommended a repeat colonoscopy at age 35 for screening purposes.  Can use preparation H or Anusol for rectal bleeding episodes, use Miralax daily to keep bowel movements soft.    Colonoscopy, Adult, Care After This sheet gives you information about how to care for yourself after your procedure. Your doctor may also give you more specific instructions. If you have problems or questions, call your doctor. What can I expect after the procedure? After the procedure, it is common to have:  A small amount of blood in your poop (stool) for 24 hours.  Some gas.  Mild cramping or bloating in your belly (abdomen). Follow these instructions at home: Eating and drinking  Drink enough fluid to keep your pee (urine) pale yellow.  Follow instructions from your doctor about what you cannot eat or drink.  Return to your normal diet as told by your doctor. Avoid heavy or fried foods that are hard to digest.   Activity  Rest as told by your doctor.  Do not sit for a long time without moving. Get up to take short walks every 1-2 hours. This is important. Ask for help if you feel weak or unsteady.  Return to your normal activities as told by your doctor. Ask your doctor what activities are safe for you. To help cramping and bloating:  Try walking around.  Put heat on your belly as told by your doctor. Use the heat source that your doctor recommends, such as a moist heat pack or a heating pad. ? Put a towel between your skin and the  heat source. ? Leave the heat on for 20-30 minutes. ? Remove the heat if your skin turns bright red. This is very important if you are unable to feel pain, heat, or cold. You may have a greater risk of getting burned.   General instructions  If you were given a medicine to help you relax (sedative) during your procedure, it can affect you for many hours. Do not drive or use machinery until your doctor says that it is safe.  For the first 24 hours after the procedure: ? Do not sign important documents. ? Do not drink alcohol. ? Do your daily activities more slowly than normal. ? Eat foods that are soft and easy to digest.  Take over-the-counter or prescription medicines only as told by your doctor.  Keep all follow-up visits as told by your doctor. This is important. Contact a doctor if:  You have blood in your poop 2-3 days after the procedure. Get help right away if:  You have more than a small amount of blood in your poop.  You see large clumps of tissue (blood clots) in your poop.  Your belly is swollen.  You feel like you may vomit (nauseous).  You vomit.  You have a fever.  You have belly pain that gets worse, and medicine does not help your pain. Summary  After the procedure, it is common to have a small amount of blood in your poop. You may also have  mild cramping and bloating in your belly.  If you were given a medicine to help you relax (sedative) during your procedure, it can affect you for many hours. Do not drive or use machinery until your doctor says that it is safe.  Get help right away if you have a lot of blood in your poop, feel like you may vomit, have a fever, or have more belly pain. This information is not intended to replace advice given to you by your health care provider. Make sure you discuss any questions you have with your health care provider. Document Revised: 09/28/2019 Document Reviewed: 06/18/2019 Elsevier Patient Education  2021 [place].    Upper Endoscopy, Adult, Care After This sheet gives you information about how to care for yourself after your procedure. Your health care provider may also give you more specific instructions. If you have problems or questions, contact your health care provider. What can I expect after the procedure? After the procedure, it is common to have:  A sore throat.  Mild stomach pain or discomfort.  Bloating.  Nausea. Follow these instructions at home:  Follow instructions from your health care provider about what to eat or drink after your procedure.  Return to your normal activities as told by your health care provider. Ask your health care provider what activities are safe for you.  Take over-the-counter and prescription medicines only as told by your health care provider.  If you were given a sedative during the procedure, it can affect you for several hours. Do not drive or operate machinery until your health care provider says that it is safe.  Keep all follow-up visits as told by your health care provider. This is important.   Contact a health care provider if you have:  A sore throat that lasts longer than one day.  Trouble swallowing. Get help right away if:  You vomit blood or your vomit looks like coffee grounds.  You have: ? A fever. ? Bloody, black, or tarry stools. ? A severe sore throat or you cannot swallow. ? Difficulty breathing. ? Severe pain in your chest or abdomen. Summary  After the procedure, it is common to have a sore throat, mild stomach discomfort, bloating, and nausea.  If you were given a sedative during the procedure, it can affect you for several hours. Do not drive or operate machinery until your health care provider says that it is safe.  Follow instructions from your health care provider about what to eat or drink after your procedure.  Return to your normal activities as told by your health care provider. This information is not  intended to replace advice given to you by your health care provider. Make sure you discuss any questions you have with your health care provider. Document Revised: 11/20/2019 Document Reviewed: 04/24/2018 Elsevier Patient Education  2021 [place].  Sucralfate oral suspension What is this medicine? SUCRALFATE (SOO kral fate) helps to treat ulcers of the intestine. This medicine may be used for other purposes; ask your health care provider or pharmacist if you have questions. COMMON BRAND NAME(S): Carafate What should I tell my health care provider before I take this medicine? They need to know if you have any of these conditions:  kidney disease  an unusual or allergic reaction to sucralfate, other medicines, foods, dyes, or preservatives  pregnant or trying to get pregnant  breast-feeding How should I use this medicine? Take this medicine by mouth with a glass of water. Follow  the directions on the prescription label. Shake well before using. Use a specially marked spoon or container to measure your medicine. Ask your pharmacist if you do not have one. Household spoons are not accurate. This medicine works best if you take it on an empty stomach, 1 hour before meals. Take your doses at regular intervals. Do not take your medicine more often than directed. Do not stop taking except on your doctor's advice. Talk to your pediatrician regarding the use of this medicine in children. Special care may be needed. Overdosage: If you think you have taken too much of this medicine contact a poison control center or emergency room at once. NOTE: This medicine is only for you. Do not share this medicine with others. What if I miss a dose? If you miss a dose, take it as soon as you can. If it is almost time for your next dose, take only that dose. Do not take double or extra doses. What may interact with this  medicine?  antacid  cimetidine  digoxin  ketoconazole  phenytoin  quinidine  ranitidine  some antibiotics like ciprofloxacin, norfloxacin, and ofloxacin  theophylline  thyroid hormones  warfarin This list may not describe all possible interactions. Give your health care provider a list of all the medicines, herbs, non-prescription drugs, or dietary supplements you use. Also tell them if you smoke, drink alcohol, or use illegal drugs. Some items may interact with your medicine. What should I watch for while using this medicine? Visit your doctor or health care professional for regular check ups. Let your doctor know if your symptoms do not improve or if you feel worse. Antacids should not be taken within one half hour before or after this medicine. What side effects may I notice from receiving this medicine? Side effects that you should report to your doctor or health care professional as soon as possible:  allergic reactions like skin rash, itching or hives, swelling of the face, lips, or tongue  difficulty breathing Side effects that usually do not require medical attention (report to your doctor or health care professional if they continue or are bothersome):  back pain  constipation  drowsy, dizzy  dry mouth  headache  stomach upset, gas  trouble sleeping This list may not describe all possible side effects. Call your doctor for medical advice about side effects. You may report side effects to FDA at 1-800-FDA-1088. Where should I keep my medicine? Keep out of the reach of children. Store at room temperature between 20 and 25 degrees C (68 and 77 degrees F). Keep container tightly closed. Throw away any unused medicine after the expiration date. NOTE: This sheet is a summary. It may not cover all possible information. If you have questions about this medicine, talk to your doctor, pharmacist, or health care provider.  2021 Elsevier/Gold Standard (2008-07-24  15:47:18)  Lansoprazole capsules What is this medicine? LANSOPRAZOLE (lan SOE pra zole) prevents the production of acid in the stomach. It is used to treat gastroesophageal reflux disease (GERD), ulcers, certain bacteria in the stomach, inflammation of the esophagus, and Zollinger-Ellison Syndrome. It can also be used to prevent and treat ulcers in patients taking medicines called non-steroidal anti-inflammatory drugs (NSAIDs). This medicine may be used for other purposes; ask your health care provider or pharmacist if you have questions. COMMON BRAND NAME(S): Heartburn Relief, Prevacid What should I tell my health care provider before I take this medicine? They need to know if you have any of these conditions:  liver disease  low levels of magnesium in the blood  lupus  an unusual or allergic reaction to lansoprazole, other medicines, foods, dyes, or preservatives  pregnant or trying to get pregnant  breast-feeding How should I use this medicine? Take this medicine by mouth with water. Take it as directed on the prescription label at the same time every day. Do not cut, crush or chew this medicine. Swallow the capsules whole. You may open the capsule and put the contents in 1 teaspoon of applesauce. Swallow the medicine and applesauce right away. Do not chew the medicine or applesauce. Keep taking it unless your health care provider tells you to stop. A special MedGuide will be given to you by the pharmacist with each prescription and refill. Be sure to read this information carefully each time. Talk to your health care provider about the use of this medicine in children. Special care may be needed. Overdosage: If you think you have taken too much of this medicine contact a poison control center or emergency room at once. NOTE: This medicine is only for you. Do not share this medicine with others. What if I miss a dose? If you miss a dose, take it as soon as you can. If it is almost time  for your next dose, take only that dose. Do not take double or extra doses. What may interact with this medicine? Do not take this medicine with any of the following medications:  atazanavir  nelfinavir This medicine may also interact with the following medications:  ampicillin  delavirdine  digoxin  diuretics  iron salts  itraconazole, ketoconazole, voriconazole, or other prescription medicines for fungus or yeast infections  sucralfate  theophylline  warfarin This list may not describe all possible interactions. Give your health care provider a list of all the medicines, herbs, non-prescription drugs, or dietary supplements you use. Also tell them if you smoke, drink alcohol, or use illegal drugs. Some items may interact with your medicine. What should I watch for while using this medicine? It can take several days before your stomach pain gets better. Check with your doctor or health care professional if your condition does not start to get better, or if it gets worse. Do not treat diarrhea with over the counter products. Contact your doctor if you have diarrhea that lasts more than 2 days or if it is severe and watery. You may need blood work done while you are taking this medicine. This medicine may cause a decrease in vitamin B12. You should make sure that you get enough vitamin B12 while you are taking this medicine. Discuss the foods you eat and the vitamins you take with your health care professional. What side effects may I notice from receiving this medicine? Side effects that you should report to your doctor or health care professional as soon as possible:   allergic reactions like skin rash, itching or hives, swelling of the face, lips, or tongue   bone, muscle or joint pain   breathing problems   chest pain or chest tightness   dark yellow or brown urine   diarrhea   dizziness   fast, irregular heartbeat   feeling faint or  lightheaded   fever or sore throat   muscle spasm   palpitations   rash on cheeks or arms that gets worse in the sun   redness, blistering, peeling or loosening of the skin, including inside the mouth   seizures  stomach polyps   tremors  unusual bleeding or bruising   unusually weak or tired   yellowing of the eyes or skin Side effects that usually do not require medical attention (report to your doctor or health care professional if they continue or are bothersome):   constipation   dry mouth   headache   loose stools   nausea This list may not describe all possible side effects. Call your doctor for medical advice about side effects. You may report side effects to FDA at 1-800-FDA-1088. Where should I keep my medicine? Keep out of the reach of children. Store at room temperature between 15 and 30 degrees C (59 and 86 degrees F). Protect from moisture. Throw away any unused medicine after the expiration date. NOTE: This sheet is a summary. It may not cover all possible information. If you have questions about this medicine, talk to your doctor, pharmacist, or health care provider.  2021 Elsevier/Gold Standard (2018-11-27 13:26:45)

## 2021-01-16 NOTE — Transfer of Care (Signed)
Immediate Anesthesia Transfer of Care Note  Patient: Janese Horseman  Procedure(s) Performed: COLONOSCOPY WITH PROPOFOL (N/A ) ESOPHAGOGASTRODUODENOSCOPY (EGD) WITH PROPOFOL (N/A )  Patient Location: PACU  Anesthesia Type:General  Level of Consciousness: drowsy  Airway & Oxygen Therapy: Patient Spontanous Breathing  Post-op Assessment: Report given to RN and Post -op Vital signs reviewed and stable  Post vital signs: Reviewed and stable  Last Vitals:  Vitals Value Taken Time  BP 100/68 01/16/21 1515  Temp    Pulse 76 01/16/21 1515  Resp 21 01/16/21 1515  SpO2 96 % 01/16/21 1515  Vitals shown include unvalidated device data.  Last Pain:  Vitals:   01/16/21 1355  TempSrc:   PainSc: 7       Patients Stated Pain Goal: 8 (16/07/37 1062)  Complications: No complications documented.

## 2021-01-19 ENCOUNTER — Telehealth (HOSPITAL_COMMUNITY): Payer: Medicaid Other | Admitting: Psychiatry

## 2021-01-21 ENCOUNTER — Encounter (HOSPITAL_COMMUNITY): Payer: Self-pay | Admitting: Gastroenterology

## 2021-01-21 ENCOUNTER — Encounter: Payer: Self-pay | Admitting: Family Medicine

## 2021-01-23 DIAGNOSIS — Z79899 Other long term (current) drug therapy: Secondary | ICD-10-CM | POA: Diagnosis not present

## 2021-01-27 ENCOUNTER — Ambulatory Visit (HOSPITAL_COMMUNITY)
Admission: RE | Admit: 2021-01-27 | Discharge: 2021-01-27 | Disposition: A | Discharge status: Home / Self Care | Payer: Medicaid Other | Source: Ambulatory Visit | Attending: Family Medicine | Admitting: Family Medicine

## 2021-01-27 ENCOUNTER — Other Ambulatory Visit: Payer: Self-pay

## 2021-01-27 DIAGNOSIS — R413 Other amnesia: Secondary | ICD-10-CM | POA: Diagnosis not present

## 2021-01-27 DIAGNOSIS — R4182 Altered mental status, unspecified: Secondary | ICD-10-CM | POA: Diagnosis not present

## 2021-01-27 DIAGNOSIS — R410 Disorientation, unspecified: Secondary | ICD-10-CM | POA: Diagnosis not present

## 2021-01-27 DIAGNOSIS — R519 Headache, unspecified: Secondary | ICD-10-CM | POA: Insufficient documentation

## 2021-01-27 DIAGNOSIS — G930 Cerebral cysts: Secondary | ICD-10-CM | POA: Diagnosis not present

## 2021-01-27 DIAGNOSIS — J320 Chronic maxillary sinusitis: Secondary | ICD-10-CM | POA: Diagnosis not present

## 2021-01-28 ENCOUNTER — Encounter: Payer: Self-pay | Admitting: Family Medicine

## 2021-01-28 NOTE — Telephone Encounter (Signed)
Appointment changed to virtual visit.

## 2021-01-29 ENCOUNTER — Telehealth (INDEPENDENT_AMBULATORY_CARE_PROVIDER_SITE_OTHER): Payer: Medicaid Other | Admitting: Family Medicine

## 2021-01-29 ENCOUNTER — Other Ambulatory Visit: Payer: Self-pay

## 2021-01-29 ENCOUNTER — Telehealth: Payer: Self-pay | Admitting: Family Medicine

## 2021-01-29 DIAGNOSIS — E639 Nutritional deficiency, unspecified: Secondary | ICD-10-CM | POA: Diagnosis not present

## 2021-01-29 DIAGNOSIS — R410 Disorientation, unspecified: Secondary | ICD-10-CM | POA: Diagnosis not present

## 2021-01-29 MED ORDER — CEFPROZIL 500 MG PO TABS: 500.0000 mg | ORAL_TABLET | Freq: Two times a day (BID) | ORAL | 0 refills | Status: DC

## 2021-01-29 MED ORDER — FLUCONAZOLE 150 MG PO TABS: ORAL_TABLET | ORAL | 0 refills | Status: DC

## 2021-01-29 NOTE — Progress Notes (Signed)
   Subjective:    Patient ID: Penny [name], female    DOB: [birth year], 35 y.o.   MRN: [id number]  HPI Pt here for 4 week follow up on confusion. Pt states she is still having confusion and headache. Pt did have MRI brain completed. Still having intermittent spells where she seems to lose touch with reality and sometimes she states several minutes opacified it she was not aware of denies any seizure activity with this when family is with her they do not see any seizure activity but nonetheless this is very stressful to the patient Virtual Visit via Telephone Note  I connected with Sophronia Peabody on 01/29/21 at  9:00 AM EST by telephone and verified that I am speaking with the correct person using two identifiers.  Location: Patient: home Provider: office   I discussed the limitations, risks, security and privacy concerns of performing an evaluation and management service by telephone and the availability of in person appointments. I also discussed with the patient that there may be a patient responsible charge related to this service. The patient expressed understanding and agreed to proceed.   History of Present Illness:    Observations/Objective:   Assessment and Plan:   Follow Up Instructions:    I discussed the assessment and treatment plan with the patient. The patient was provided an opportunity to ask questions and all were answered. The patient agreed with the plan and demonstrated an understanding of the instructions.   The patient was advised to call back or seek an in-person evaluation if the symptoms worsen or if the condition fails to improve as anticipated.  I provided 20 minutes of non-face-to-face time during this encounter.       Review of Systems Intermittent headaches intermittent nausea vomiting diarrhea trying to do better with diet and appetite denies of medications causing drowsiness states she is on a large amount of pain medicine because of her chronic  pain    Objective:   Physical Exam        Assessment & Plan:  Very nice patient Still having some spells where she loses awareness of what is going on does not appear to be a seizure is just more of a disconnecting from her surroundings.  At times she feels like she is there but not able to communicate.  The spells happen intermittently maybe every several weeks and only lasts a few minutes at a time.  Her MRI was unremarkable.  I believe it would be a good idea for her to see neurology for thoroughness.  I did prepare the patient that potentially neurology will not be able to reveal any additional information but it would still be helpful to get their opinion  I have also encouraged the patient to work with her pain management doctor to try to gradually be on less pain pills per day she would need to taper this down gradually and she would need the assistance of her pain management doctor.  She is doing counseling she finds that some of the spells happen more so when she is stressed which certainly can happen in regards to interaction of stress related issues and medical conditions.  It would be wise for the patient to do a follow-up with [name] in 3 months  Patient still has intermittent vomiting spells I encouraged her to reach out to gastroenterology to get their input

## 2021-01-29 NOTE — Telephone Encounter (Signed)
Ms. zhoe, catania are scheduled for a virtual visit with your provider today.    Just as we do with appointments in the office, we must obtain your consent to participate.  Your consent will be active for this visit and any virtual visit you may have with one of our providers in the next 365 days.    If you have a MyChart account, I can also send a copy of this consent to you electronically.  All virtual visits are billed to your insurance company just like a traditional visit in the office.  As this is a virtual visit, video technology does not allow for your provider to perform a traditional examination.  This may limit your provider's ability to fully assess your condition.  If your provider identifies any concerns that need to be evaluated in person or the need to arrange testing such as labs, EKG, etc, we will make arrangements to do so.    Although advances in technology are sophisticated, we cannot ensure that it will always work on either your end or our end.  If the connection with a video visit is poor, we may have to switch to a telephone visit.  With either a video or telephone visit, we are not always able to ensure that we have a secure connection.   I need to obtain your verbal consent now.   Are you willing to proceed with your visit today?   Paityn Gehrig has provided verbal consent on 01/29/2021 for a virtual visit (video or telephone).   Vicente Males, LPN 06/11/8674  4:49 AM

## 2021-01-30 ENCOUNTER — Other Ambulatory Visit: Payer: Self-pay

## 2021-01-30 ENCOUNTER — Telehealth (INDEPENDENT_AMBULATORY_CARE_PROVIDER_SITE_OTHER): Payer: Medicaid Other | Admitting: Psychiatry

## 2021-01-30 ENCOUNTER — Encounter (HOSPITAL_COMMUNITY): Payer: Self-pay | Admitting: Psychiatry

## 2021-01-30 ENCOUNTER — Telehealth (INDEPENDENT_AMBULATORY_CARE_PROVIDER_SITE_OTHER): Payer: Self-pay | Admitting: Gastroenterology

## 2021-01-30 ENCOUNTER — Encounter (HOSPITAL_COMMUNITY): Payer: Self-pay

## 2021-01-30 ENCOUNTER — Other Ambulatory Visit (INDEPENDENT_AMBULATORY_CARE_PROVIDER_SITE_OTHER): Payer: Self-pay | Admitting: Gastroenterology

## 2021-01-30 DIAGNOSIS — K5900 Constipation, unspecified: Secondary | ICD-10-CM

## 2021-01-30 DIAGNOSIS — F411 Generalized anxiety disorder: Secondary | ICD-10-CM

## 2021-01-30 DIAGNOSIS — F900 Attention-deficit hyperactivity disorder, predominantly inattentive type: Secondary | ICD-10-CM

## 2021-01-30 DIAGNOSIS — F332 Major depressive disorder, recurrent severe without psychotic features: Secondary | ICD-10-CM | POA: Diagnosis not present

## 2021-01-30 MED ORDER — AMPHETAMINE-DEXTROAMPHET ER 30 MG PO CP24: 30.0000 mg | ORAL_CAPSULE | ORAL | 0 refills | Status: DC

## 2021-01-30 MED ORDER — BUSPIRONE HCL 10 MG PO TABS: 10.0000 mg | ORAL_TABLET | Freq: Three times a day (TID) | ORAL | 2 refills | Status: DC

## 2021-01-30 MED ORDER — LINACLOTIDE 290 MCG PO CAPS: 290.0000 ug | ORAL_CAPSULE | Freq: Every day | ORAL | 3 refills | Status: DC

## 2021-01-30 MED ORDER — TRAZODONE HCL 100 MG PO TABS: 200.0000 mg | ORAL_TABLET | Freq: Every day | ORAL | 2 refills | Status: DC

## 2021-01-30 MED ORDER — CLONAZEPAM 0.5 MG PO TABS: 0.5000 mg | ORAL_TABLET | Freq: Two times a day (BID) | ORAL | 2 refills | Status: DC

## 2021-01-30 MED ORDER — DULOXETINE HCL 60 MG PO CPEP: 60.0000 mg | ORAL_CAPSULE | Freq: Two times a day (BID) | ORAL | 2 refills | Status: DC

## 2021-01-30 NOTE — Progress Notes (Signed)
Virtual Visit via Video Note  I connected with Penny [name] on 01/30/21 at 11:40 AM EST by a video enabled telemedicine application and verified that I am speaking with the correct person using two identifiers.  Location: Patient: home Provider: home   I discussed the limitations of evaluation and management by telemedicine and the availability of in person appointments. The patient expressed understanding and agreed to proceed.   I discussed the assessment and treatment plan with the patient. The patient was provided an opportunity to ask questions and all were answered. The patient agreed with the plan and demonstrated an understanding of the instructions.   The patient was advised to call back or seek an in-person evaluation if the symptoms worsen or if the condition fails to improve as anticipated.  I provided 15 minutes of non-face-to-face time during this encounter.   [name], MD  [facility] MD/PA/NP OP Progress Note  01/30/2021 12:05 PM Penny [name]  MRN:  [id number]  Chief Complaint:  Chief Complaint    Anxiety; Depression; Follow-up     HPI: This patient is a 35 year old female who is now living alone in [place]. 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 She has 2 children with whom she shares custody with their fathers.  Patient returns for follow-up after 4 weeks.  She states that she is doing somewhat better.  She states that she made up her mind to be "the strongest mother I can be for my children."  She had a normal brain MRI but is still having some episodes of spacing out and confusion.  She is being referred to neurology.  She does think they are stress related.  I have tried to get Rexulti approved for her to take to augment her adjunct depressive but not much luck with the insurance company.  We will keep trying.  She is focusing well with the Adderall XR.  She denies serious depression or suicidal ideation.  She does have adult ulcer that was  recently diagnosed to endoscopy and she has lost quite a bit of weight in the last 3 months she is down to 155 pounds.  She is trying to eat which she can. Visit Diagnosis:    ICD-10-CM   1. GAD (generalized anxiety disorder)  F41.1   2. Severe episode of recurrent major depressive disorder, without psychotic features ([place])  F33.2   3. Attention deficit hyperactivity disorder (ADHD), predominantly inattentive type  F90.0     Past Psychiatric History: Hospitalization last year for depression  Past Medical History:  Past Medical History:  Diagnosis Date  . ADD (attention deficit disorder)   . Anemia 2011   2o to GASTRIC BYPASS  . Anginal pain ([place])   . Anxiety   . Asthma   . Blood transfusion without reported diagnosis   . Chronic daily headache   . Depression   . Depression   . Dysmenorrhea 09/18/2013  . Dysrhythmia   . Elevated liver enzymes JUL 2011ALK PHOS 111-127 AST  143-267 ALT  213-321T BILI 0.6  ALB  3.7-4.06 Jun 2011 ALK PHOS 118 AST 24 ALT 42 T BILI 0.4 ALB 3.9  . Encounter for drug rehabilitation    at behavioral health for opioid addiction about 4 years ago  . Family history of adverse reaction to anesthesia    'dad had to be kept on pump for breathing for morphine'  . Fatty liver   . Fibroid 01/18/2017  . Fibromyalgia   .  Gastric bypass status for obesity   . Gastritis JULY 2011  . Heart murmur   . 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 History of Holter monitoring   . Hx of opioid abuse ([place])    for about 4 years, about 4 years ago  . Interstitial cystitis   . Iron deficiency anemia 07/23/2010  . Irritable bowel syndrome 2012 DIARRHEA   JUN 2012 TTG IgA 14.9  . IUD FEB 2010  . Lupus ([place])   . Menorrhagia 07/18/2013  . Migraines   . Obesity (BMI 30-39.9) 2011 228 LBS BMI 36.8  . Osteoporosis   . Ovarian cyst   . Patient desires pregnancy 09/18/2013  . Polyneuropathy   . PONV (postoperative nausea and vomiting) seizure  post-operatively   seizure following ankle surgery  . Potassium (K) deficiency   . Pregnant 12/25/2013  . Psychiatric pseudoseizure   . RLQ abdominal pain 07/18/2013  . Sciatica of left side 11/14/2014  . 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 Stress 09/18/2013    Past Surgical History:  Procedure Laterality Date  . BIOPSY  04/10/2015   Procedure: BIOPSY;  Surgeon: Daneil Dolin, MD;  Location: [facility];  Service: Endoscopy;;  . cath self every nite     for sodium bicarb injection (discontinued 2013)  . CHOLECYSTECTOMY  2005   biliary dyskinesia  . COLONOSCOPY  JUN 2012 ABD PN/DIARRHEA WITH PROPOFOL   NL COLON  . COLONOSCOPY WITH PROPOFOL N/A 01/16/2021   Procedure: COLONOSCOPY WITH PROPOFOL;  Surgeon: Harvel Quale, MD;  [facility];  Service: Gastroenterology;  Laterality: N/A;  1:45  . DILATION AND CURETTAGE OF UTERUS    . DILITATION & CURRETTAGE/HYSTROSCOPY WITH NOVASURE ABLATION N/A 03/24/2017   Procedure: DILATATION & CURETTAGE/HYSTEROSCOPY WITH NOVASURE ENDOMETRIAL ABLATION;  Surgeon: Jonnie Kind, MD;  Location: [facility];  Service: Gynecology;  Laterality: N/A;  . ESOPHAGEAL DILATION N/A 04/10/2015   Procedure: ESOPHAGEAL DILATION WITH 54FR MALONEY DILATOR;  Surgeon: Daneil Dolin, MD;  Location: [facility];  Service: Endoscopy;  Laterality: N/A;  . ESOPHAGOGASTRODUODENOSCOPY    . ESOPHAGOGASTRODUODENOSCOPY (EGD) WITH PROPOFOL N/A 04/10/2015   Procedure: ESOPHAGOGASTRODUODENOSCOPY (EGD) WITH PROPOFOL;  Surgeon: Daneil Dolin, MD;  Location: [facility];  Service: Endoscopy;  Laterality: N/A;  . ESOPHAGOGASTRODUODENOSCOPY (EGD) WITH PROPOFOL N/A 12/06/2016   Procedure: ESOPHAGOGASTRODUODENOSCOPY (EGD) WITH PROPOFOL;  Surgeon: Danie Binder, MD;  [facility];  Service: Endoscopy;  Laterality: N/A;  . ESOPHAGOGASTRODUODENOSCOPY (EGD) WITH PROPOFOL N/A 04/27/2019   Procedure: ESOPHAGOGASTRODUODENOSCOPY (EGD) WITH PROPOFOL;  Surgeon: Rogene Houston, MD;   [facility];  Service: Endoscopy;  Laterality: N/A;  730  . ESOPHAGOGASTRODUODENOSCOPY (EGD) WITH PROPOFOL N/A 08/31/2019   Procedure: ESOPHAGOGASTRODUODENOSCOPY (EGD) WITH PROPOFOL;  Surgeon: Rogene Houston, MD;  [facility];  Service: Endoscopy;  Laterality: N/A;  . ESOPHAGOGASTRODUODENOSCOPY (EGD) WITH PROPOFOL N/A 01/16/2021   Procedure: ESOPHAGOGASTRODUODENOSCOPY (EGD) WITH PROPOFOL;  Surgeon: Harvel Quale, MD;  [facility];  Service: Gastroenterology;  Laterality: N/A;  . GAB  2007   in [place]-POUCH 5 CM  . GASTRIC BYPASS  06/2006  . HYSTEROSCOPY WITH D & C N/A 09/12/2014   Procedure: DILATATION AND CURETTAGE /HYSTEROSCOPY;  Surgeon: Jonnie Kind, MD;  Location: [facility];  Service: Gynecology;  Laterality: N/A;  . KYPHOPLASTY N/A 08/02/2018   Procedure: KYPHOPLASTY T12;  Surgeon: Melina Schools, MD;  Location: [facility];  Service: Orthopedics;  Laterality: N/A;  60  mins  . LAPAROSCOPIC TUBAL LIGATION Bilateral 03/24/2017   Procedure: LAPAROSCOPIC TUBAL LIGATION ([place] Rings);  Surgeon: Jonnie Kind, MD;  Location: [facility];  Service: Gynecology;  Laterality: Bilateral;  . REPAIR VAGINAL CUFF N/A 07/30/2014   Procedure: REPAIR VAGINAL CUFF;  Surgeon: Mora Bellman, MD;  Location: [facility];  Service: Gynecology;  Laterality: N/A;  . SAVORY DILATION  06/20/2012   Dr. Barnie Alderman gastritis/Ulcer in the mid jejunum. Empiric dilation.   . SURAL NERVE BX Left 02/25/2016   Procedure: LEFT SURAL NERVE BIOPSY;  Surgeon: Jovita Gamma, MD;  Location: [facility];  Service: Neurosurgery;  Laterality: Left;  Left sural nerve biopsy  . TONSILLECTOMY    . TONSILLECTOMY AND ADENOIDECTOMY    . UPPER GASTROINTESTINAL ENDOSCOPY  JULY 2011 NAUSEA-D125,V6, PH 25   Bx; GASTRITIS, POUCH-5 CM LONG  . WISDOM TOOTH EXTRACTION      Family Psychiatric History: See below  Family History:  Family History  Problem Relation Age of Onset  . Hemochromatosis  Maternal Grandmother   . Migraines Maternal Grandmother   . Cancer Maternal Grandmother   . Breast cancer Maternal Grandmother   . Hypertension Father   . Diabetes Father   . Coronary artery disease Father   . Migraines Paternal Grandmother   . Breast cancer Paternal Grandmother   . Cancer Mother        breast  . Hemochromatosis Mother   . Breast cancer Mother   . Depression Mother   . Anxiety disorder Mother   . Coronary artery disease Paternal Grandfather   . Anxiety disorder Brother   . Bipolar disorder Brother   . Healthy Daughter   . Healthy Son     Social History:  Social History   Socioeconomic History  . Marital status: Divorced    Spouse name: Not on file  . Number of children: 2  . Years of education: some college  . Highest education level: Some college, no degree  Occupational History  . Occupation: Applying for [employer]: NOT EMPLOYED  Tobacco Use  . Smoking status: Former Smoker    Packs/day: 0.25    Years: 6.00    Pack years: 1.50    Types: Cigarettes    Start date: 05/2018  . Smokeless tobacco: Never Used  Vaping Use  . Vaping Use: Some days  Substance and Sexual Activity  . Alcohol use: Not Currently    Alcohol/week: 0.0 standard drinks  . Drug use: Not Currently  . Sexual activity: Yes    Partners: Male    Birth control/protection: Surgical, Condom    Comment: tubal and ablation  Other Topics Concern  . Not on file  Social History Narrative   Patient is right handed.   Patient drinks one cup of coffee daily.   She lives in a one-story home with her two children (59 year old son, 19 month old daughter).   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 Difficulty of Paying Living Expenses: Very hard  Food Insecurity: No Food Insecurity  . Worried About Charity fundraiser in the Last Year: Never  true  . Ran Out of Food in the Last Year: Never true  Transportation Needs: No Transportation Needs  . Lack of Transportation (Medical): No  . Lack of Transportation (Non-Medical): No  Physical Activity: Inactive  .  Days of Exercise per Week: 0 days  . Minutes of Exercise per Session: 0 min  Stress: Stress Concern Present  . Feeling of Stress : Very much  Social Connections: Moderately Isolated  . Frequency of Communication with Friends and Family: More than three times a week  . Frequency of Social Gatherings with Friends and Family: More than three times a week  . Attends Religious Services: More than 4 times per year  . Active Member of Clubs or Organizations: No  . Attends Archivist Meetings: Never  . Marital Status: Divorced    Allergies:  Allergies  Allergen Reactions  . Gabapentin Other (See Comments)    Pt states that she was unresponsive after taking this medication, but her vitals remained stable.    . Metoclopramide Hcl Anxiety and Other (See Comments)    Pt states that she felt like she was trapped in a box, and could not get out.  Pt also states that she had temporary loss of movement, weakness, and tingling.    . Tramadol Other (See Comments)    Seizures  . Ativan [Lorazepam] Other (See Comments)    combative  . Latex Itching, Rash and Other (See Comments)    Burns  . Lyrica [Pregabalin] Other (See Comments)    suicidal  . Iron Other (See Comments)    PATIENT IS INTOLERANT TO ORAL IRON , SHE DOES NOT ABSORB IT.    Marland Kitchen Sulfa Antibiotics Hives  . Butrans [Buprenorphine] Rash    Patch caused rash, didn't help with pain  . Nucynta [Tapentadol] Nausea And Vomiting  . Propofol Nausea And Vomiting  . Tape Hives, Itching and Rash    Please use paper tape  . Toradol [Ketorolac Tromethamine] Anxiety    Metabolic Disorder Labs: Lab Results  Component Value Date   HGBA1C 5.7 (H) 01/15/2015   Lab Results  Component Value Date   PROLACTIN 80.0 (H)  05/09/2015   Lab Results  Component Value Date   CHOL 95 03/10/2012   TRIG 50 03/10/2012   HDL 38 (L) 03/10/2012   CHOLHDL 2.5 03/10/2012   VLDL 10 03/10/2012   LDLCALC 47 03/10/2012   Lab Results  Component Value Date   TSH 1.350 01/01/2021   TSH 2.616 08/25/2019    Therapeutic Level Labs: No results found for: LITHIUM Lab Results  Component Value Date   VALPROATE 66 06/23/2020   No components found for:  CBMZ  Current Medications: Current Outpatient Medications  Medication Sig Dispense Refill  . amphetamine-dextroamphetamine (ADDERALL XR) 30 MG 24 hr capsule Take 1 capsule (30 mg total) by mouth every morning. 30 capsule 0  . AIMOVIG 70 MG/ML SOAJ Inject 70 mg as directed every 30 (thirty) days.    Marland Kitchen albuterol (VENTOLIN HFA) 108 (90 Base) MCG/ACT inhaler Inhale 2 puffs into the lungs every 6 (six) hours as needed for wheezing or shortness of breath.    . amphetamine-dextroamphetamine (ADDERALL XR) 30 MG 24 hr capsule Take 1 capsule (30 mg total) by mouth every morning. 30 capsule 0  . brexpiprazole (REXULTI) 2 MG TABS tablet Take one half tablet daily for one week, then increase to one tablet daily 30 tablet 2  . budesonide-formoterol (SYMBICORT) 160-4.5 MCG/ACT inhaler Inhale 2 puffs into the lungs 2 (two) times daily. (Patient taking differently: Inhale 2 puffs into the lungs 2 (two) times daily as needed (shortness of breath).) 1 Inhaler 5  . Buprenorphine HCl (BELBUCA) 600 MCG FILM Place 600 mcg inside cheek 2 (  two) times daily.    . busPIRone (BUSPAR) 10 MG tablet Take 1 tablet (10 mg total) by mouth 3 (three) times daily. 90 tablet 2  . Calcium Carb-Cholecalciferol (CALCIUM-VITAMIN D3) 600-500 MG-UNIT CAPS Take 1 tablet by mouth daily.    . Carboxymethylcellul-Glycerin (LUBRICATING EYE DROPS OP) Place 1 drop into both eyes daily as needed (dry eyes).    . cefPROZIL (CEFZIL) 500 MG tablet Take 1 tablet (500 mg total) by mouth 2 (two) times daily. 20 tablet 0  .  cetirizine (ZYRTEC) 10 MG tablet Take 10 mg by mouth daily.    . clonazePAM (KLONOPIN) 0.5 MG tablet Take 1 tablet (0.5 mg total) by mouth 2 (two) times daily. 60 tablet 2  . cyclobenzaprine (FLEXERIL) 10 MG tablet Take 10 mg by mouth 3 (three) times daily as needed for muscle spasms.    . DULoxetine (CYMBALTA) 60 MG capsule Take 1 capsule (60 mg total) by mouth 2 (two) times daily. 180 capsule 2  . fluconazole (DIFLUCAN) 150 MG tablet One po qd prn yeast infection; may repeat in 3-4 days if needed 2 tablet 0  . HYDROmorphone (DILAUDID) 4 MG tablet Take 4 mg by mouth every 4 (four) hours as needed for severe pain.    Marland Kitchen lansoprazole (PREVACID) 30 MG capsule Take 1 capsule (30 mg total) by mouth 2 (two) times daily before a meal. 180 capsule 0  . linaclotide (LINZESS) 145 MCG CAPS capsule Take 1 capsule (145 mcg total) by mouth daily. 30 capsule 5  . ondansetron (ZOFRAN) 8 MG tablet Take 1 tablet (8 mg total) by mouth every 8 (eight) hours as needed for nausea. 12 tablet 3  . ondansetron (ZOFRAN-ODT) 8 MG disintegrating tablet Take 1 tablet (8 mg total) by mouth every 8 (eight) hours as needed. 50 tablet 3  . Pediatric Multivit-Minerals-C (FLINTSTONES GUMMIES) chewable tablet Chew 3 tablets by mouth daily.    . promethazine (PHENERGAN) 12.5 MG tablet Take 1 tablet (12.5 mg total) by mouth every 8 (eight) hours as needed for nausea or vomiting. 30 tablet 0  . promethazine (PHENERGAN) 25 MG tablet Take 25 mg by mouth 2 (two) times daily as needed for nausea or vomiting.    Marland Kitchen rOPINIRole (REQUIP) 1 MG tablet Take one tablet po each evening. (Patient taking differently: Take 1 mg by mouth at bedtime.) 30 tablet 5  . sucralfate (CARAFATE) 1 g tablet Take 1 tablet (1 g total) by mouth 4 (four) times daily. 120 tablet 0  . traZODone (DESYREL) 100 MG tablet Take 2 tablets (200 mg total) by mouth at bedtime. 60 tablet 2  . valACYclovir (VALTREX) 1000 MG tablet TAKE 1 TABLET BY MOUTH DAILY. (Patient taking  differently: Take 1,000 mg by mouth daily as needed (fever blisters).) 90 tablet PRN  . Vitamin D, Ergocalciferol, (DRISDOL) 1.25 MG (50000 UNIT) CAPS capsule Take 50,000 Units by mouth every Wednesday.     No current facility-administered medications for this visit.     Musculoskeletal: Strength & Muscle Tone: within normal limits Gait & Station: normal Patient leans: N/A  Psychiatric Specialty Exam: Review of Systems  Gastrointestinal: Positive for nausea and vomiting.  Psychiatric/Behavioral: Positive for confusion.  All other systems reviewed and are negative.   There were no vitals taken for this visit.There is no height or weight on file to calculate BMI.  General Appearance: Casual and Fairly Groomed  Eye Contact:  Good  Speech:  Clear and Coherent  Volume:  Normal  Mood:  Anxious and  Euthymic  Affect:  Appropriate and Congruent  Thought Process:  Goal Directed  Orientation:  Full (Time, Place, and Person)  Thought Content: Rumination   Suicidal Thoughts:  No  Homicidal Thoughts:  No  Memory:  Immediate;   Good Recent;   Good Remote;   NA  Judgement:  Fair  Insight:  Fair  Psychomotor Activity:  Decreased  Concentration:  Concentration: Good and Attention Span: Good  Recall:  Good  Fund of Knowledge: Good  Language: Good  Akathisia:  No  Handed:  Right  AIMS (if indicated): not done  Assets:  Communication Skills Desire for Improvement Resilience Social Support Talents/Skills  ADL's:  Intact  Cognition: WNL  Sleep:  Good   Screenings: AIMS   Flowsheet Row Admission (Discharged) from 06/19/2020 in [facility]  AIMS Total Score 0    AUDIT   Flowsheet Row Patient Outreach Telephone from 07/14/2020 in [facility] Admission (Discharged) from 06/19/2020 in [facility] ED to Hosp-Admission (Discharged) from 05/09/2015 in [facility]  Alcohol Use Disorder Identification Test Final Score (AUDIT) 1 1 0    GAD-7   Flowsheet Row Counselor from 12/11/2018 in [facility] Counselor from 02/07/2017 in [facility]  Total GAD-7 Score 19 17    PHQ2-9   Flowsheet Row Nutrition [facility]/14/2021 [facility] Patient Outreach Telephone from 07/14/2020 in [facility] ED from 06/17/2020 in [facility] Counselor from 12/11/2018 in [facility] Counselor from 07/22/2017 in [facility]  PHQ-2 Total Score _0 PHQ-9 Total Score _1 Flowsheet Row Admission (Discharged) from 01/16/2021 in [facility] from 01/14/2021 in [facility] Admission (Discharged) from 06/19/2020 in [facility]  C-SSRS RISK CATEGORY No Risk No Risk Low Risk       Assessment and Plan: This patient is a 35 year old female with a history of depression anxiety ADHD borderline personality and insomnia.  She seems to be doing better this time.  We are still trying to get her Rexulti but proved to augment her antidepressant.  For now she will continue Adderall XR 30 mg every morning for focus, Cymbalta 120 mg daily for depression, BuSpar 10 mg 3 times daily for anxiety and clonazepam 0.5 mg twice daily for anxiety and trazodone 200 mg at bedtime for sleep.  She will return to see me in 6 weeks   [name], MD 01/30/2021, 12:05 PM

## 2021-01-30 NOTE — Telephone Encounter (Signed)
I spoke with the patient regarding her ongoing symptoms of abdominal pain, nausea and constipation.  We will try to address the symptoms with increasing her dose of Linzess to 290 mcg/day which may provide improvement of her constipation but also of nociception.  I considered her symptoms are likely related to bowel hypersensitivity due to chronic opiate intake, but also she had an anastomotic ulcer that has not healed well, she was previously recommended to stop smoking and to take high-dose PPI and Carafate.  We will address if she has been compliant with her smoking cessation and eventual wound healing in the future.  I explained to her very thoroughly that she may present central abdominal pain due to long-term exposure to opiates which she understood.

## 2021-01-30 NOTE — Telephone Encounter (Signed)
Patient called and states she had a TCS and EGD done on 01/16/2021 and she was still having some mid abdominal pain and Right upper Quad pain, she has some nausea,vomiting,constipation that alternates with diarrhea. No fever, and no dark or bloody stools seen.

## 2021-01-30 NOTE — Telephone Encounter (Signed)
Patient called the office stated she had a procedure on 01/16/21 - she is still in a lot of pain,some days she cant poop,she has cramping and diarrhea - please advise - ph# [phone]

## 2021-02-02 ENCOUNTER — Other Ambulatory Visit (INDEPENDENT_AMBULATORY_CARE_PROVIDER_SITE_OTHER): Payer: Self-pay | Admitting: Gastroenterology

## 2021-02-02 DIAGNOSIS — G8929 Other chronic pain: Secondary | ICD-10-CM

## 2021-02-02 NOTE — Telephone Encounter (Signed)
We will also schedule her for a CT abdomen and pelvis with IV contrast to evaluate her renal pain further.  Darius Bump, please schedule this test for her.

## 2021-02-09 NOTE — Telephone Encounter (Signed)
CT is scheduled on 03/02/21 and she is aware

## 2021-02-09 NOTE — Telephone Encounter (Signed)
Thanks

## 2021-02-12 ENCOUNTER — Encounter: Payer: Medicaid Other | Attending: Family Medicine | Admitting: Nutrition

## 2021-02-12 ENCOUNTER — Telehealth: Payer: Self-pay | Admitting: Nutrition

## 2021-02-12 DIAGNOSIS — K582 Mixed irritable bowel syndrome: Secondary | ICD-10-CM | POA: Insufficient documentation

## 2021-02-12 DIAGNOSIS — Z9884 Bariatric surgery status: Secondary | ICD-10-CM | POA: Insufficient documentation

## 2021-02-12 NOTE — Patient Instructions (Signed)
Goals Established by Pt . Eat three balanced meals per day or small frequent meals every 2-3 hours. . Cut out fried and processed foods       Drink only water    Get back to taking your bariatric vitamins as instructed at post op.    Increase quality proteins and nutrient dense foods    Avoid foods that cause GI upset.

## 2021-02-12 NOTE — Telephone Encounter (Signed)
Call x 2 to remind of appt. Called due to NS and left message for family member to have her call to reschedule her appointment if interested.

## 2021-02-13 ENCOUNTER — Other Ambulatory Visit: Payer: Self-pay | Admitting: Allergy & Immunology

## 2021-02-15 ENCOUNTER — Encounter: Payer: Self-pay | Admitting: Family Medicine

## 2021-02-15 ENCOUNTER — Encounter (INDEPENDENT_AMBULATORY_CARE_PROVIDER_SITE_OTHER): Payer: Self-pay

## 2021-02-16 ENCOUNTER — Other Ambulatory Visit (INDEPENDENT_AMBULATORY_CARE_PROVIDER_SITE_OTHER): Payer: Self-pay | Admitting: Gastroenterology

## 2021-02-16 ENCOUNTER — Other Ambulatory Visit: Payer: Self-pay | Admitting: Family Medicine

## 2021-02-16 MED ORDER — CILIDINIUM-CHLORDIAZEPOXIDE 2.5-5 MG PO CAPS: 1.0000 | ORAL_CAPSULE | Freq: Two times a day (BID) | ORAL | 3 refills | Status: DC

## 2021-02-16 NOTE — Telephone Encounter (Signed)
Nurses  Please send notification to the patient Also she may have refill of Carafate Utilize 3 times daily as needed along with her other GI meds Unfortunately I am not aware of any additional medicines that we can add that would necessarily help I see where gastroenterology is ordering a CAT scan I do believe that that is a good idea I am sympathetic to what is going on but I am at a loss to what else to add Thanks-Dr. Nicki Reaper

## 2021-02-16 NOTE — Telephone Encounter (Signed)
1.  Please find out from Penny [name] what is she taking currently to try to help?  #2 she does have a follow-up with gastroenterology in approximately 1 month.  She should connect with them to see if they would be willing to work her in sooner.

## 2021-02-17 ENCOUNTER — Other Ambulatory Visit: Payer: Self-pay | Admitting: *Deleted

## 2021-02-17 MED ORDER — SUCRALFATE 1 G PO TABS: 1.0000 g | ORAL_TABLET | Freq: Three times a day (TID) | ORAL | 0 refills | Status: DC

## 2021-02-20 ENCOUNTER — Other Ambulatory Visit: Payer: Self-pay | Admitting: *Deleted

## 2021-02-20 DIAGNOSIS — J45901 Unspecified asthma with (acute) exacerbation: Secondary | ICD-10-CM | POA: Diagnosis not present

## 2021-02-20 DIAGNOSIS — R0602 Shortness of breath: Secondary | ICD-10-CM | POA: Diagnosis not present

## 2021-02-20 DIAGNOSIS — R079 Chest pain, unspecified: Secondary | ICD-10-CM | POA: Diagnosis not present

## 2021-02-20 DIAGNOSIS — Z79899 Other long term (current) drug therapy: Secondary | ICD-10-CM | POA: Diagnosis not present

## 2021-02-20 DIAGNOSIS — R000 Tachycardia, unspecified: Secondary | ICD-10-CM | POA: Diagnosis not present

## 2021-02-20 DIAGNOSIS — M546 Pain in thoracic spine: Secondary | ICD-10-CM | POA: Diagnosis not present

## 2021-02-21 IMAGING — US US ABDOMEN LIMITED
1 series · 14 of 25 positions shown · non-contrast
Comparison: CT abdomen pelvis 01/03/2020

CLINICAL DATA: Right upper quadrant pain for 3 years.

EXAM:
ULTRASOUND ABDOMEN LIMITED RIGHT UPPER QUADRANT

[Series 1: us abdomen limited ruq (liver/gb) · 14 of 32 slices shown]
[im 1/32]
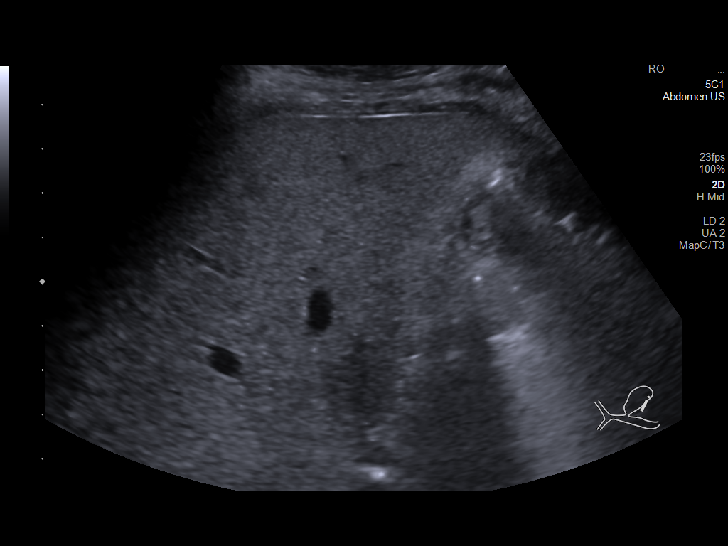
[im 3/32]
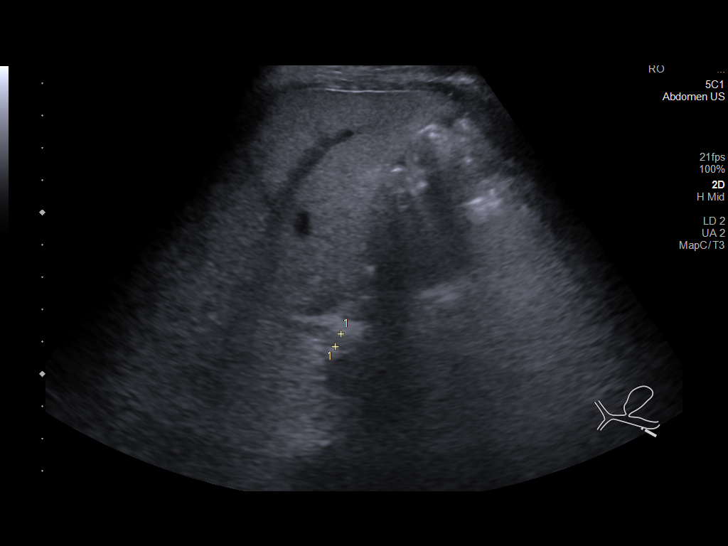
[im 6/32]
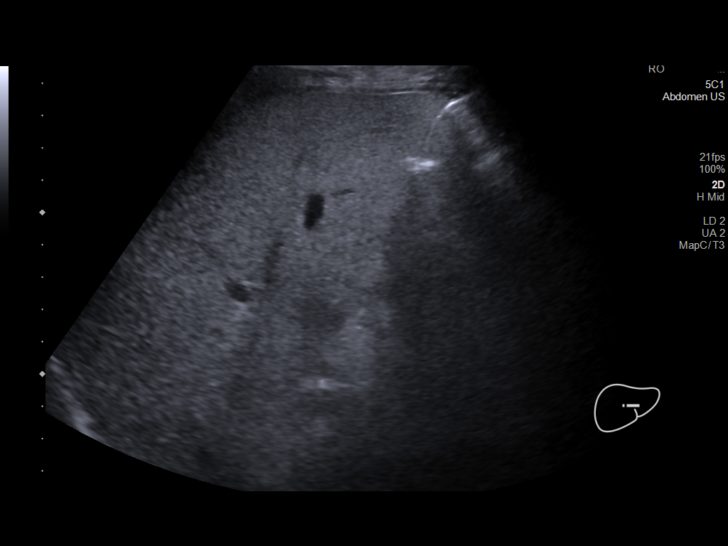
[im 8/32]
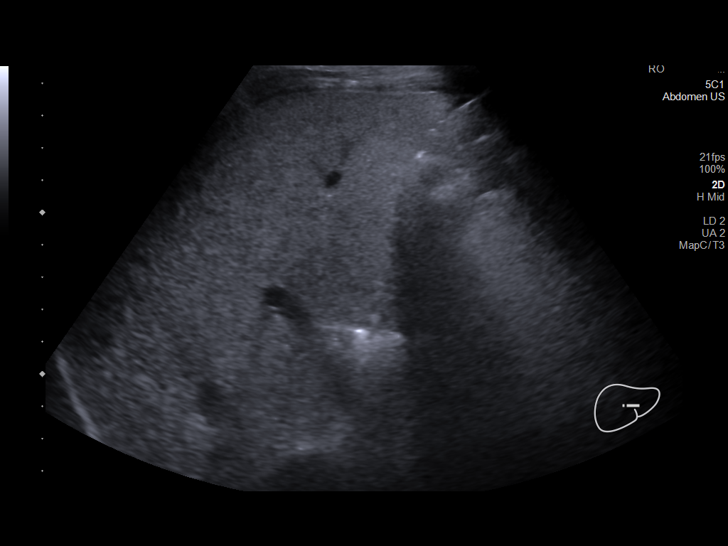
[im 11/32]
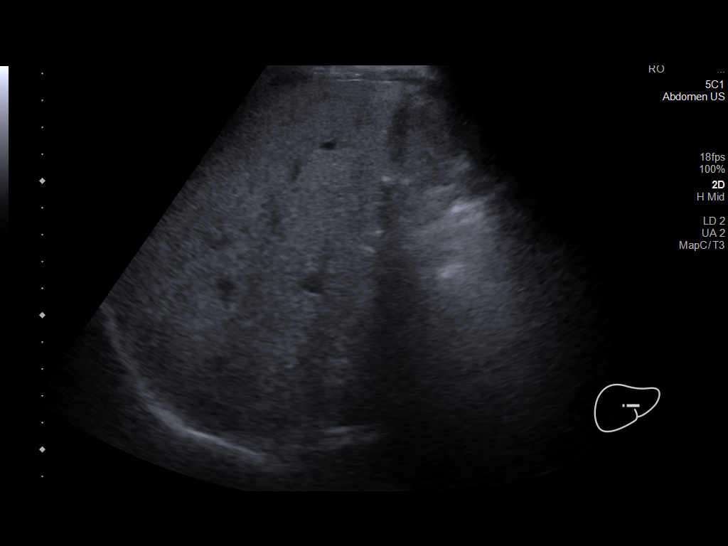
[im 12/32]
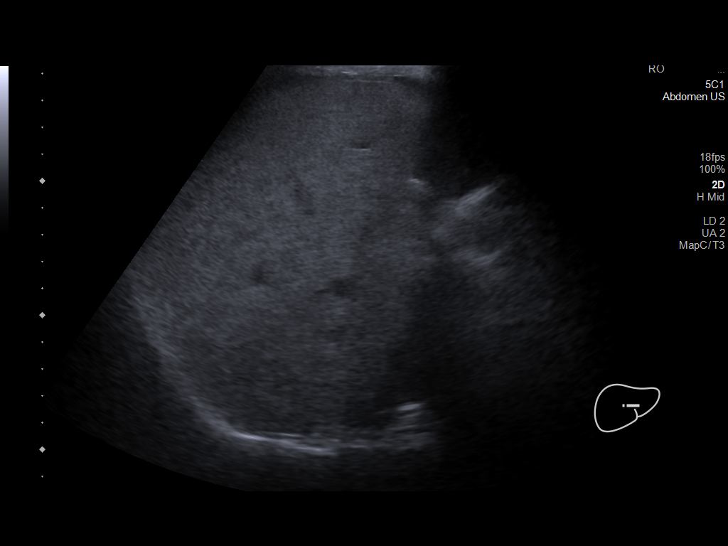
[im 15/32]
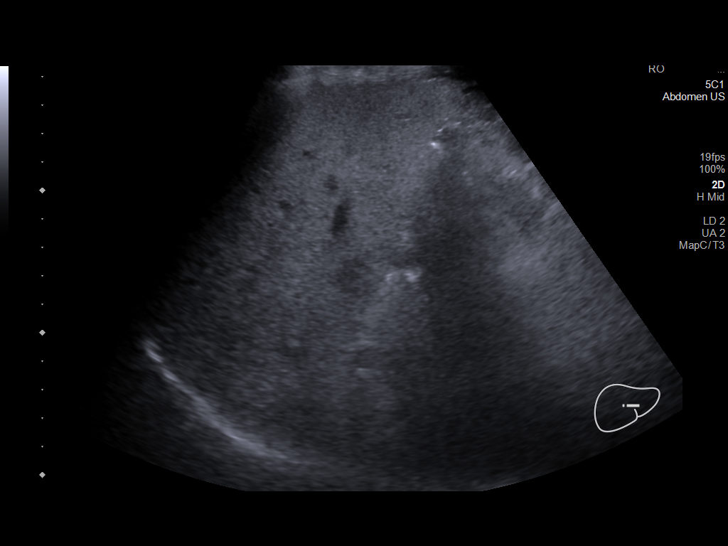
[im 17/32]
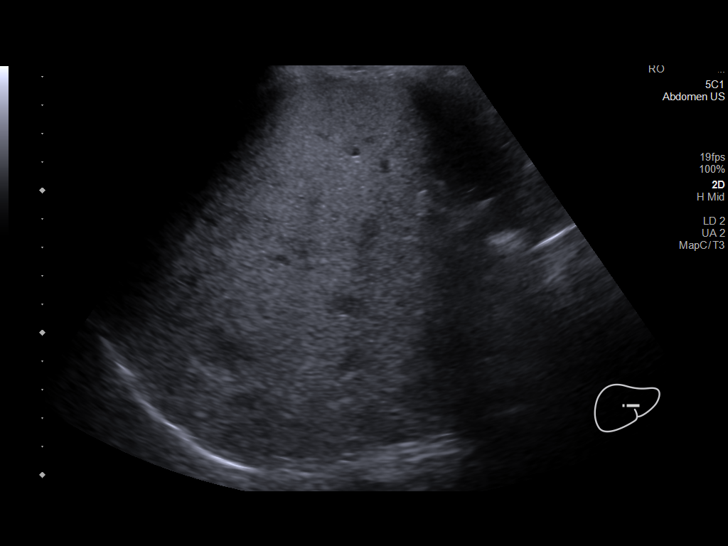
[im 20/32]
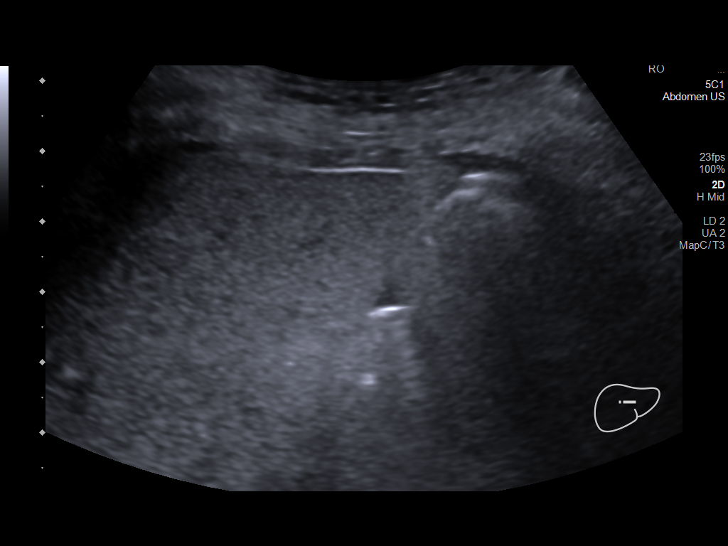
[im 21/32]
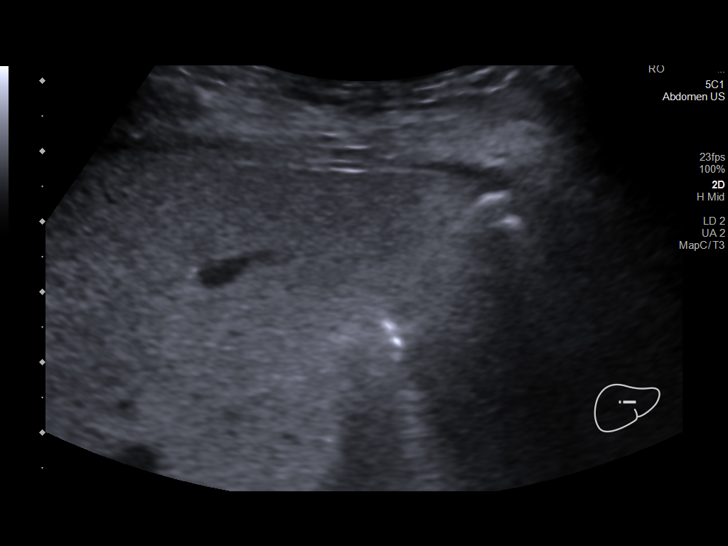
[im 24/32]
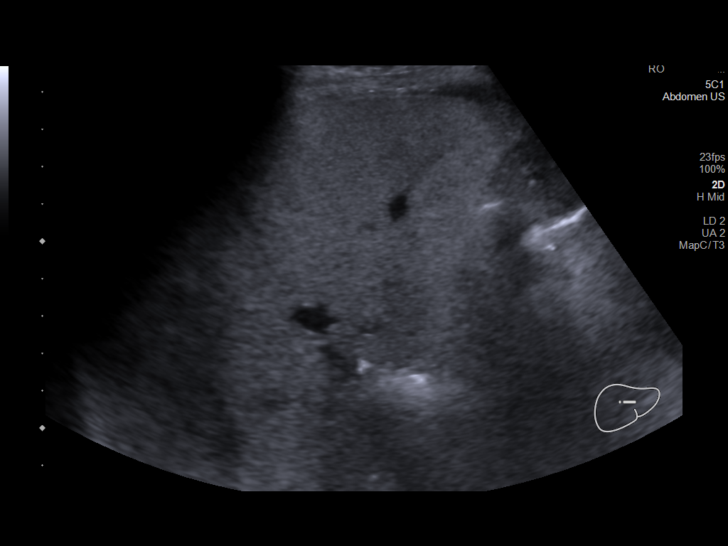
[im 26/32]
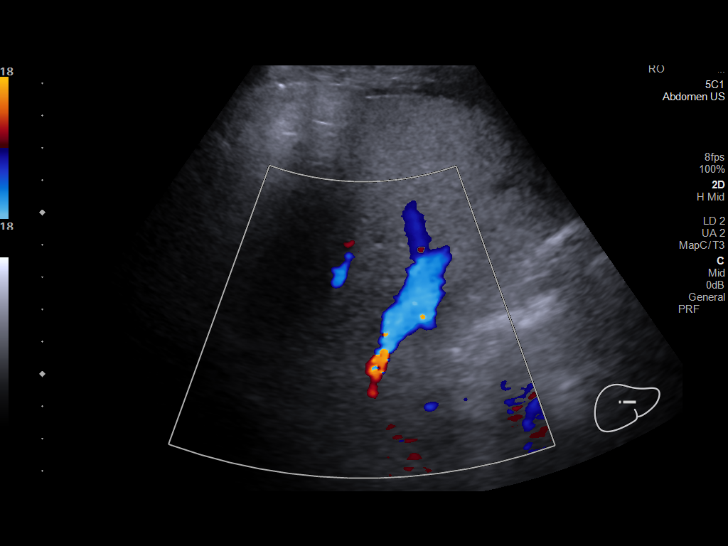
[im 29/32]
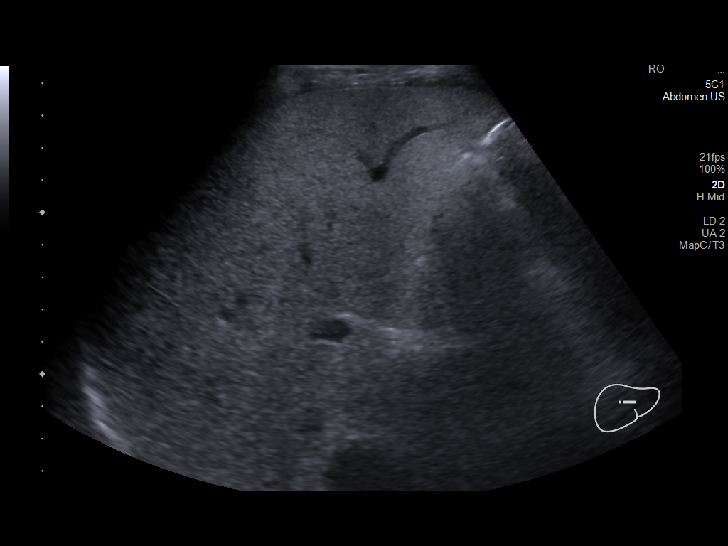
[im 32/32]
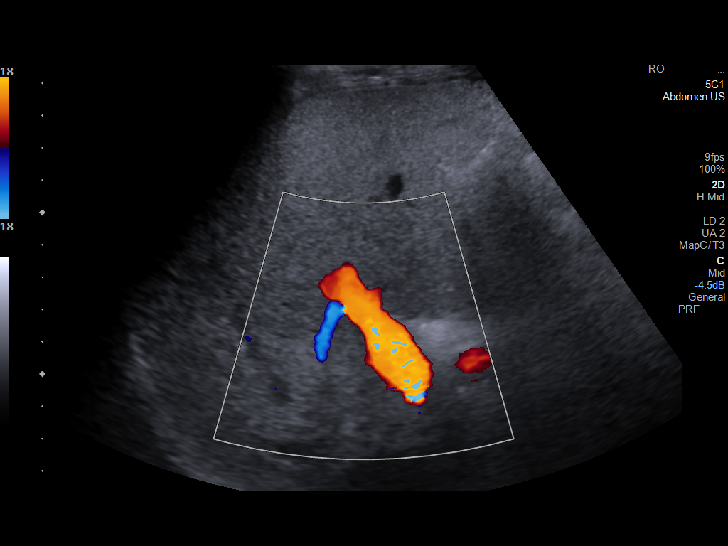

[14 of 25 positions shown; findings below may reference images not displayed]

FINDINGS: Gallbladder:

Surgically absent.

Common bile duct:

Diameter: 0.4 cm, within normal limits.

Liver:

No focal lesion identified. Liver parenchymal echogenicity is
diffusely increased. Portal vein is patent on color Doppler imaging
with normal direction of blood flow towards the liver.

Other: None.
IMPRESSION: Diffusely increased liver parenchymal echogenicity which is a
nonspecific finding but most commonly seen with hepatic steatosis.

## 2021-02-23 ENCOUNTER — Telehealth: Payer: Self-pay | Admitting: Allergy & Immunology

## 2021-02-23 NOTE — Telephone Encounter (Signed)
Pt called and states she had an asthma attack last Friday. She states she needs a prior authorization for her xopenex inhaler & that her insurance should have sent it in today. She also requested a breathing treatment. Pt was last seen in June of 2021, I scheduled an appointment for this Friday 02/27/21 with Webb Silversmith.   Please Advise.

## 2021-02-23 NOTE — Telephone Encounter (Signed)
Called and spoke to patient and she stated that she has been out of the Xopenex for a while and when she tried to get it the other day they stated that she needed a PA. No PA has been sent to [name] as of yet. A PA will be submitted today. Patient stated that when she went to her pain management physician she began to have the attack. Pain Management physician gave her Duo neb treatment in office and depo injection as well as a 14 day breo ellipta sample to take home. Patient states that she still is having trouble breathing. Patient has an appointment with Dr. Ernst Bowler in the [place] office 02/24/21

## 2021-02-23 NOTE — Telephone Encounter (Signed)
Sounds good - thanks!   Salvatore Marvel, MD [facility]

## 2021-02-23 NOTE — Telephone Encounter (Signed)
PA has been approved for Xopenex. Approval has been faxed over to pharmacy. Called patient and advised. Patient verbalized understanding.

## 2021-02-23 NOTE — Telephone Encounter (Signed)
PA has been submitted through CoverMyMeds for Xopenex inhaler and is currently pending approval/denial.

## 2021-02-24 ENCOUNTER — Telehealth: Payer: Self-pay | Admitting: *Deleted

## 2021-02-24 ENCOUNTER — Other Ambulatory Visit: Payer: Self-pay

## 2021-02-24 ENCOUNTER — Encounter: Payer: Self-pay | Admitting: Allergy & Immunology

## 2021-02-24 ENCOUNTER — Ambulatory Visit (INDEPENDENT_AMBULATORY_CARE_PROVIDER_SITE_OTHER): Payer: Medicaid Other | Admitting: Allergy & Immunology

## 2021-02-24 VITALS — BP 122/80 | HR 128 | Temp 96.6°F | Ht 66.0 in | Wt 162.2 lb

## 2021-02-24 DIAGNOSIS — J302 Other seasonal allergic rhinitis: Secondary | ICD-10-CM | POA: Diagnosis not present

## 2021-02-24 DIAGNOSIS — J4540 Moderate persistent asthma, uncomplicated: Secondary | ICD-10-CM | POA: Diagnosis not present

## 2021-02-24 DIAGNOSIS — R0602 Shortness of breath: Secondary | ICD-10-CM | POA: Diagnosis not present

## 2021-02-24 DIAGNOSIS — R911 Solitary pulmonary nodule: Secondary | ICD-10-CM

## 2021-02-24 DIAGNOSIS — J3089 Other allergic rhinitis: Secondary | ICD-10-CM | POA: Diagnosis not present

## 2021-02-24 MED ORDER — LEVALBUTEROL HCL 1.25 MG/3ML IN NEBU: 1.2500 mg | INHALATION_SOLUTION | RESPIRATORY_TRACT | 3 refills | Status: DC | PRN

## 2021-02-24 MED ORDER — LEVALBUTEROL TARTRATE 45 MCG/ACT IN AERO: INHALATION_SPRAY | RESPIRATORY_TRACT | 3 refills | Status: DC

## 2021-02-24 NOTE — Patient Instructions (Addendum)
1. Moderate persistent asthma, uncomplicated - Lung testing actually looks fairly good. Memory Dance is not covered by Medicaid.  - We are going to change you to Symbicort instead.  - We might could consider adding an injectable medication in the future for control of your breathing.  - I think we can hold off on the prednisone for now.  - Daily controller medication(s): Symbicort 160/4.80mcg two puffs twice daily with spacer and Singulair (montelukast) 10mg  daily - Prior to physical activity: Xopenex 2 puffs 10-15 minutes before physical activity. - Rescue medications: Xopenex nebulizer solution or Xopenex 4 puffs every 4-6 hours as needed - Asthma control goals:  * Full participation in all desired activities (may need albuterol before activity) * Albuterol use two time or less a week on average (not counting use with activity) * Cough interfering with sleep two time or less a month * Oral steroids no more than once a year * No hospitalizations  2. Chronic rhinitis (grasses, cockroach, trees, ragweed, and trees) - Continue taking: Zyrtec (cetirizine) 10mg  tablet once daily, Singulair (montelukast) 10mg  daily and Flonase (fluticasone) one spray per nostril daily  - You can use an extra dose of the antihistamine, if needed, for breakthrough symptoms.  - Consider nasal saline rinses 1-2 times daily to remove allergens from the nasal cavities as well as help with mucous clearance (this is especially helpful to do before the nasal sprays are given)  3. Return in about 4 weeks (around 03/24/2021).    Please inform us of any Emergency Department visits, hospitalizations, or changes in symptoms. Call us before going to the ED for breathing or allergy symptoms since we might be able to fit you in for a sick visit. Feel free to contact us anytime with any questions, problems, or concerns.  It was a pleasure to see you again today!  Websites that have reliable patient information: 1. American Academy of  Asthma, Allergy, and Immunology: www.aaaai.org 2. Food Allergy Research and Education (FARE): foodallergy.org 3. Mothers of Asthmatics: http://www.asthmacommunitynetwork.org 4. American College of Allergy, Asthma, and Immunology: www.acaai.org   COVID-19 Vaccine Information can be found at: ShippingScam.co.uk For questions related to vaccine distribution or appointments, please email [email or web address] or call [phone].   We realize that you might be concerned about having an allergic reaction to the COVID19 vaccines. To help with that concern, WE ARE OFFERING THE COVID19 VACCINES IN OUR OFFICE! Ask the front desk for dates!     "Like" [name] on Facebook and Instagram for our latest updates!      A healthy democracy works best when [employer] participate! Make sure you are registered to vote! If you have moved or changed any of your contact information, you will need to get this updated before voting!  In some cases, you MAY be able to register to vote online: [email or web address]

## 2021-02-24 NOTE — Progress Notes (Signed)
FOLLOW UP  Date of Service/Encounter:  02/24/21   Assessment:   Moderate persistent asthma - complicated by non compliance  Perennial and seasonal allergicrhinitis(grasses, cockroach, trees, ragweed, and trees)  Migraines  Pulmonary nodules with ground glass opacities (September 2021) - following up with repeat chest CT today  COVID-19 infection - September 7035  Complicated past medical history, including fibromyalgia, anxiety and depression, chronic abdominal pain, seizures and pseudoseizures, drug abuse, and gastric bypass  Plan/Recommendations:    1. Moderate persistent asthma, uncomplicated - Lung testing actually looks fairly good. Memory Dance is not covered by Medicaid.  - We are going to change you to Symbicort instead.  - We might could consider adding an injectable medication in the future for control of your breathing.  - I think we can hold off on the prednisone for now.  - We will follow up on the chest CT from September with a repeat one. - Daily controller medication(s): Symbicort 160/4.88mcg two puffs twice daily with spacer and Singulair (montelukast) 10mg  daily - Prior to physical activity: Xopenex 2 puffs 10-15 minutes before physical activity. - Rescue medications: Xopenex nebulizer solution or Xopenex 4 puffs every 4-6 hours as needed - Asthma control goals:  * Full participation in all desired activities (may need albuterol before activity) * Albuterol use two time or less a week on average (not counting use with activity) * Cough interfering with sleep two time or less a month * Oral steroids no more than once a year * No hospitalizations  2. Chronic rhinitis (grasses, cockroach, trees, ragweed, and trees) - Continue taking: Zyrtec (cetirizine) 10mg  tablet once daily, Singulair (montelukast) 10mg  daily and Flonase (fluticasone) one spray per nostril daily  - You can use an extra dose of the antihistamine, if needed, for breakthrough symptoms.  -  Consider nasal saline rinses 1-2 times daily to remove allergens from the nasal cavities as well as help with mucous clearance (this is especially helpful to do before the nasal sprays are given)  3. Return in about 4 weeks (around 03/24/2021).     Subjective:   Penny [name] is a 35 y.o. female presenting today for follow up of  Chief Complaint  Patient presents with  . Asthma    Says she had an asthma attack this past Friday. Acid reflux has been with the asthma attack as well.    Penny [name] has a history of the following: Patient Active Problem List   Diagnosis Date Noted  . Chronic prescription opiate use 12/25/2020  . 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 Constipation 12/25/2020  . Major depressive disorder, recurrent episode ([place]) 06/22/2020  . Major depression 06/19/2020  . Dehydration   . Chronic abdominal pain 09/19/2019  . Intractable nausea and vomiting 09/19/2019  . Gastroesophageal reflux disease without esophagitis   . Cannabis abuse 08/30/2019  . Dyspnea   . Severe anxiety 08/21/2019  . Hematemesis with nausea 04/23/2019  . Absolute anemia 04/23/2019  . Urge incontinence 10/26/2018  . S/P kyphoplasty 08/02/2018  . Herpes genitalis in women 12/19/2017  . Cannabis use disorder, moderate, dependence ([place]) 07/07/2017  . Abnormal urine odor 03/30/2017  . Urinary tract infection with hematuria 03/30/2017  . Screening for genitourinary condition 03/30/2017  . Pain 03/30/2017  . Burning with urination 03/30/2017  . Family history of breast cancer in mother,uncertain BR CA status 02/28/2017  . Fibroid 01/18/2017  . Chronic pain syndrome 12/05/2016  . Folate deficiency 12/04/2016  . Nausea vomiting and diarrhea 12/04/2016  .  Severe anemia 12/04/2016  . Symptomatic anemia 12/03/2016  . Anastomotic ulcer 04/22/2016  . Pain medication agreement signed 04/22/2016  . Complaints of leg weakness   . Paresthesia   . Left-sided weakness   . Uncontrolled pain  01/24/2016  . Malnutrition of moderate degree 01/24/2016  . Neuropathy 01/23/2016  . Inability to walk 01/23/2016  . Paresthesias   . Bilateral leg numbness 11/26/2015  . 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 Post partum depression 05/09/2015  . Dysphagia   . Hematemesis 04/07/2015  . Intractable vomiting with nausea 04/07/2015  . Elevated liver enzymes   . Epigastric pain   . 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 Seizure-like activity ([place]) 02/22/2015  . Fibromyalgia 02/18/2015  . Vulvar fissure 02/18/2015  . Clinical depression 02/01/2015  . Current smoker 01/29/2015  . Current tobacco use 01/29/2015  . 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 Leg weakness, bilateral 01/02/2015  . Gait disorder 01/02/2015  . Other symptoms and signs involving the musculoskeletal system 01/02/2015  . Abnormal gait 01/02/2015  . Cervicalgia 12/18/2014  . Sciatica of left side 11/14/2014  . Neuralgia neuritis, sciatic nerve 11/14/2014  . 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 Encounter for sterilization 09/09/2014  . Abdominal pain, acute, right lower quadrant 08/22/2014  . Headache, migraine 08/19/2014  . Seizure ([place]) 08/18/2014  . Encephalopathy 08/13/2014  . Headache 08/13/2014  . Altered mental status 08/13/2014  . Right leg numbness 07/31/2014  . Disturbance of skin sensation 07/31/2014  . Hypertension in pregnancy, transient 07/08/2014  . Previous gastric bypass affecting pregnancy, antepartum 05/22/2014  . History of bariatric surgery 05/22/2014  . 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 Antepartum mental disorder in pregnancy 05/09/2014  . Rapid palpitations 02/18/2014  . H/O maternal third degree perineal laceration, currently pregnant 02/18/2014  . High-risk pregnancy 02/18/2014  . Supervision of pregnancy with other poor reproductive or obstetric  history, unspecified trimester 02/18/2014  . Restless legs 01/16/2014  . Restless leg 01/16/2014  . Chronic interstitial cystitis 10/23/2013  . Menorrhagia 07/18/2013  . Excessive and frequent menstruation 07/18/2013  . h/o Opiate addiction 03/11/2012    Class: Acute  . History of migraine headaches 03/10/2012    Class: Acute  . Depression with anxiety 03/10/2012    Class: Chronic  . 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 Attention deficit hyperactivity disorder, 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    Class: Chronic  . Panic disorder without agoraphobia 03/10/2012  . H/O disease 03/10/2012  . Dysthymia 03/10/2012  . 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 Other specified behavioral and emotional disorders with onset usually occurring in childhood and adolescence 03/10/2012  . Pelvic congestion syndrome 10/13/2011  . Coitalgia 10/13/2011  . Chronic migraine without aura 10/13/2011  . Unspecified dyspareunia 10/13/2011  . Other specified conditions associated with female genital organs and menstrual cycle 10/13/2011  . IBS (irritable bowel syndrome) 08/25/2011  . Diarrhea 05/27/2011  . OBESITY, UNSPECIFIED 09/17/2010  . Transaminitis 09/17/2010  . IDA (iron deficiency anemia) 07/23/2010    History obtained from: chart review and patient.  Penny [name] is a 35 y.o. female presenting for a sick visit.  She was last seen in June 2021.  At that time, it was a televisit so we did not do lung testing.  We continued with Symbicort 160 mcg 2 puffs twice daily as well  as Xopenex as needed.  For her rhinitis, we continue with Zyrtec as well as Singulair and Flonase.  Since last visit, she has not done well. She had COVID last time we talked to her on the phone in September 2021. Urgent Care called EMS and she went to the hospital for that. She did have antibody infusions during that time. Other people in the family  did get it as well. It went through the entire house.  Asthma/Respiratory Symptom History: She started having symptoms on Thursday with SOB. She had oxygen saturations of 76%. She was placed on oxygen in the clinic and given DuoNeb and a steroid shot. She apparently is no longer on a maintenance inhaler. She was given a Breo sample but she is not sure that this was done. I showed her pictures of the Symbicort on the computer and she does not think that this is familiar at all. She denies nighttime coughing.   GERD Symptom History: She does have coughing at night from acid reflux. She is on carafate and Prevacid for this. She also has promethazine and ondansetron.   Allergic Rhinitis Symptom History: She is using Zyrtec, although it is not every day.  She does not use a nose spray.  She does endorse some postnasal drip and congestion.  She had the COVID-19 in September, but otherwise has been fairly stable at least from an infectious standpoint.  She did have a chest CT in September 2021.  It showed a cavitary pulmonary nodule with groundglass opacities and infiltrates as well as a severe white compression fracture of L1.  She has not had a repeat chest CT to follow-up on this.  Her primary care doctor, Dr. Wolfgang Phoenix, recommended getting a repeat chest CT.  Otherwise, there have been no changes to her past medical history, surgical history, family history, or social history.    Review of Systems  Constitutional: Negative.  Negative for chills, fever, malaise/fatigue and weight loss.  HENT: Positive for congestion. Negative for ear discharge, ear pain and sinus pain.   Eyes: Negative for pain, discharge and redness.  Respiratory: Positive for shortness of breath and wheezing. Negative for cough and sputum production.   Cardiovascular: Negative.  Negative for chest pain and palpitations.  Gastrointestinal: Negative for abdominal pain, constipation, diarrhea, heartburn, nausea and vomiting.  Skin:  Negative.  Negative for itching and rash.  Neurological: Negative for dizziness and headaches.  Endo/Heme/Allergies: Positive for environmental allergies. Does not bruise/bleed easily.       Objective:   Blood pressure 122/80, pulse (!) 128, temperature (!) 96.6 F (35.9 C), height 5\' 6"  (1.676 m), weight 162 lb 3.2 oz (73.6 kg), SpO2 98 %. Body mass index is 26.18 kg/m.   Physical Exam:  Physical Exam Constitutional:      Appearance: She is well-developed.     Comments: She actually seems more lucid and together today than she usually does.  HENT:     Head: Normocephalic and atraumatic.     Right Ear: Tympanic membrane, ear canal and external ear normal.     Left Ear: Tympanic membrane, ear canal and external ear normal.     Nose: No nasal deformity, septal deviation, mucosal edema or rhinorrhea.     Right Sinus: No maxillary sinus tenderness or frontal sinus tenderness.     Left Sinus: No maxillary sinus tenderness or frontal sinus tenderness.     Mouth/Throat:     Mouth: Mucous membranes are not pale and not dry.  Pharynx: Uvula midline.  Eyes:     General:        Right eye: No discharge.        Left eye: No discharge.     Conjunctiva/sclera: Conjunctivae normal.     Right eye: Right conjunctiva is not injected. No chemosis.    Left eye: Left conjunctiva is not injected. No chemosis.    Pupils: Pupils are equal, round, and reactive to light.  Cardiovascular:     Rate and Rhythm: Normal rate and regular rhythm.     Heart sounds: Normal heart sounds.  Pulmonary:     Effort: Pulmonary effort is normal. No tachypnea, accessory muscle usage or respiratory distress.     Breath sounds: Normal breath sounds. No wheezing, rhonchi or rales.     Comments: Some decreased air movement at the bases.  Isolated wheezes. Chest:     Chest wall: No tenderness.  Lymphadenopathy:     Cervical: No cervical adenopathy.  Skin:    General: Skin is warm.     Capillary Refill:  Capillary refill takes less than 2 seconds.     Coloration: Skin is not pale.     Findings: No abrasion, erythema, petechiae or rash. Rash is not papular, urticarial or vesicular.     Comments: No eczematous or urticarial lesions noted.  Neurological:     Mental Status: She is alert.  Psychiatric:        Behavior: Behavior is cooperative.      Diagnostic studies:    Spirometry: results normal (FEV1: 3.16/94%, FVC: 4.14/103%, FEV1/FVC: 76%).    Spirometry consistent with normal pattern.   Allergy Studies: none        Salvatore Marvel, MD  [facility]

## 2021-02-24 NOTE — Telephone Encounter (Signed)
Received fax from [employer] [employer] that Levalbuterol 45 mcg is approved for 02/23/21 through 02/23/22.

## 2021-02-25 ENCOUNTER — Telehealth: Payer: Self-pay | Admitting: *Deleted

## 2021-02-25 NOTE — Telephone Encounter (Signed)
PA has been approved. Form has been labeled and placed in bulk scanning.

## 2021-02-25 NOTE — Telephone Encounter (Signed)
PA has been submitted for Levalbuterol Nebulizer medication through CoverMyMeds and is currently pending approval/denial.

## 2021-02-27 ENCOUNTER — Ambulatory Visit: Payer: Medicaid Other | Admitting: Family Medicine

## 2021-03-02 ENCOUNTER — Other Ambulatory Visit (INDEPENDENT_AMBULATORY_CARE_PROVIDER_SITE_OTHER): Payer: Self-pay | Admitting: Gastroenterology

## 2021-03-02 ENCOUNTER — Ambulatory Visit (HOSPITAL_COMMUNITY): Admission: RE | Admit: 2021-03-02 | Payer: Medicaid Other | Source: Ambulatory Visit

## 2021-03-02 ENCOUNTER — Ambulatory Visit: Payer: Self-pay

## 2021-03-02 DIAGNOSIS — K5903 Drug induced constipation: Secondary | ICD-10-CM

## 2021-03-02 DIAGNOSIS — T402X5A Adverse effect of other opioids, initial encounter: Secondary | ICD-10-CM

## 2021-03-02 MED ORDER — NALOXEGOL OXALATE 12.5 MG PO TABS
12.5000 mg | ORAL_TABLET | Freq: Every day | ORAL | 3 refills | Status: DC
Start: 2021-03-02 — End: 2021-03-26

## 2021-03-03 ENCOUNTER — Encounter (INDEPENDENT_AMBULATORY_CARE_PROVIDER_SITE_OTHER): Payer: Self-pay

## 2021-03-04 ENCOUNTER — Encounter: Payer: Self-pay | Admitting: Family Medicine

## 2021-03-04 ENCOUNTER — Encounter (INDEPENDENT_AMBULATORY_CARE_PROVIDER_SITE_OTHER): Payer: Self-pay

## 2021-03-04 ENCOUNTER — Telehealth (INDEPENDENT_AMBULATORY_CARE_PROVIDER_SITE_OTHER): Payer: Self-pay

## 2021-03-04 NOTE — Telephone Encounter (Signed)
Nurses Unfortunately Ellice is having severe trouble with this that would benefit consultation with her GI doc I strongly encourage her to send a message to her GI doctor to see what their input would be Perhaps they will have additional useful tips  Currently I would recommend MiraLAX twice daily with water 1 capful into 8 ounces of water May use mag citrate once a day as needed Also Dulcolax suppository twice daily as needed until having bowel movement  If severe pain, bilious vomiting, bloody stools or bloody vomitus immediately go to ER.  If unable to eat and drink immediately go to ER.  Thanks-Dr. Nicki Reaper

## 2021-03-04 NOTE — Telephone Encounter (Signed)
FYI, Per Penny [name] patient has an appointment scheduled on March 13, 2021 at the Bariatric clinic per [facility] patient should already know about this as they called her with the appt date and time.

## 2021-03-04 NOTE — Telephone Encounter (Signed)
I spoke with the patient today, who reported that after taking the Movantik for 2 days she had a bowel movement today.  I explained to her that she can keep taking the medication daily but if by Friday she is still having more regular bowel movements she may need to increase the dosage to 25 mg every day, which means she will need to take 2 tablets each day.  She understood this.  She reports feeling better after she was able to move her bowels and her nausea has improved.  She will see the bariatric surgeon next month.  I reinforced the importance of quitting cigarette smoking and continuing the medication to help wound healing.  [name], MD Gastroenterology and Hepatology [facility]

## 2021-03-10 ENCOUNTER — Other Ambulatory Visit: Payer: Self-pay | Admitting: Neurology

## 2021-03-10 DIAGNOSIS — R634 Abnormal weight loss: Secondary | ICD-10-CM | POA: Diagnosis not present

## 2021-03-10 DIAGNOSIS — M8580 Other specified disorders of bone density and structure, unspecified site: Secondary | ICD-10-CM | POA: Diagnosis not present

## 2021-03-10 DIAGNOSIS — E559 Vitamin D deficiency, unspecified: Secondary | ICD-10-CM | POA: Diagnosis not present

## 2021-03-12 ENCOUNTER — Encounter (HOSPITAL_COMMUNITY): Payer: Self-pay | Admitting: Psychiatry

## 2021-03-12 ENCOUNTER — Telehealth (INDEPENDENT_AMBULATORY_CARE_PROVIDER_SITE_OTHER): Payer: Medicaid Other | Admitting: Psychiatry

## 2021-03-12 ENCOUNTER — Other Ambulatory Visit: Payer: Self-pay

## 2021-03-12 DIAGNOSIS — F332 Major depressive disorder, recurrent severe without psychotic features: Secondary | ICD-10-CM

## 2021-03-12 DIAGNOSIS — F411 Generalized anxiety disorder: Secondary | ICD-10-CM | POA: Diagnosis not present

## 2021-03-12 DIAGNOSIS — F900 Attention-deficit hyperactivity disorder, predominantly inattentive type: Secondary | ICD-10-CM

## 2021-03-12 MED ORDER — AMPHETAMINE-DEXTROAMPHET ER 30 MG PO CP24: 30.0000 mg | ORAL_CAPSULE | ORAL | 0 refills | Status: DC

## 2021-03-12 MED ORDER — DULOXETINE HCL 60 MG PO CPEP: 60.0000 mg | ORAL_CAPSULE | Freq: Two times a day (BID) | ORAL | 2 refills | Status: DC

## 2021-03-12 MED ORDER — BUSPIRONE HCL 10 MG PO TABS: 10.0000 mg | ORAL_TABLET | Freq: Three times a day (TID) | ORAL | 2 refills | Status: DC

## 2021-03-12 MED ORDER — TRAZODONE HCL 100 MG PO TABS: 200.0000 mg | ORAL_TABLET | Freq: Every day | ORAL | 2 refills | Status: DC

## 2021-03-12 MED ORDER — CLONAZEPAM 0.5 MG PO TABS: 0.5000 mg | ORAL_TABLET | Freq: Two times a day (BID) | ORAL | 2 refills | Status: DC

## 2021-03-12 NOTE — Progress Notes (Signed)
Virtual Visit via Video Note  I connected with Penny [name] on 03/12/21 at 10:00 AM EDT by a video enabled telemedicine application and verified that I am speaking with the correct person using two identifiers.  Location: Patient: home Provider: home   I discussed the limitations of evaluation and management by telemedicine and the availability of in person appointments. The patient expressed understanding and agreed to proceed    I discussed the assessment and treatment plan with the patient. The patient was provided an opportunity to ask questions and all were answered. The patient agreed with the plan and demonstrated an understanding of the instructions.   The patient was advised to call back or seek an in-person evaluation if the symptoms worsen or if the condition fails to improve as anticipated.  I provided 15 minutes of non-face-to-face time during this encounter.   [name], MD  [facility] MD/PA/NP OP Progress Note  03/12/2021 10:32 AM Penny [name]  MRN:  [id number]  Chief Complaint:  Chief Complaint    Depression; Anxiety; Follow-up     HPI: This patient is a 35 year old female who is now living alone in [place]. 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 She has 2 children with whom she shares custody with their fathers.  The patient returns for follow-up after 6 weeks.  She is still having a lot of trouble with her stomach.  She claims she has serious ulcers which make it difficult for her to keep food down.  She is down to 157 pounds.  She is going to be seeing the bariatric surgeon to see if something needs to be fixed.  In terms of mood she states she is actually doing somewhat better.  She is sitting with her grandmother every day and this gives her company in a focus.  She still has difficulty sleeping even with 200 mg of trazodone but she does not want to change anything right now melatonin gives her headaches and Ambien caused her to do things that  during the night that she did not remember.  Overall her mood is fairly good and her focus is fairly good and she denies thoughts of self-harm or suicidal ideation.  Her main concern now is the abdominal pain Visit Diagnosis:    ICD-10-CM   1. GAD (generalized anxiety disorder)  F41.1   2. Major depressive disorder, recurrent episode, 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[place])  F33.2   3. Attention deficit hyperactivity disorder (ADHD), predominantly inattentive type  F90.0     Past Psychiatric History: Hospitalization last year for depression  Past Medical History:  Past Medical History:  Diagnosis Date  . ADD (attention deficit disorder)   . Anemia 2011   2o to GASTRIC BYPASS  . Anginal pain ([place])   . Anxiety   . Asthma   . Blood transfusion without reported diagnosis   . Chronic daily headache   . Depression   . Depression   . Dysmenorrhea 09/18/2013  . Dysrhythmia   . Elevated liver enzymes JUL 2011ALK PHOS 111-127 AST  143-267 ALT  213-321T BILI 0.6  ALB  3.7-4.06 Jun 2011 ALK PHOS 118 AST 24 ALT 42 T BILI 0.4 ALB 3.9  . Encounter for drug rehabilitation    at behavioral health for opioid addiction about 4 years ago  . Family history of adverse reaction to anesthesia    'Penny [name] had to be kept on pump for breathing for morphine'  . Fatty liver   . Fibroid  01/18/2017  . Fibromyalgia   . Gastric bypass status for obesity   . Gastritis JULY 2011  . Heart murmur   . 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 History of Holter monitoring   . Hx of opioid abuse ([place])    for about 4 years, about 4 years ago  . Interstitial cystitis   . Iron deficiency anemia 07/23/2010  . Irritable bowel syndrome 2012 DIARRHEA   JUN 2012 TTG IgA 14.9  . IUD FEB 2010  . Lupus ([place])   . Menorrhagia 07/18/2013  . Migraines   . Obesity (BMI 30-39.9) 2011 228 LBS BMI 36.8  . Osteoporosis   . Ovarian cyst   . Patient desires pregnancy 09/18/2013  . Polyneuropathy   . PONV  (postoperative nausea and vomiting) seizure post-operatively   seizure following ankle surgery  . Potassium (K) deficiency   . Pregnant 12/25/2013  . Psychiatric pseudoseizure   . RLQ abdominal pain 07/18/2013  . Sciatica of left side 11/14/2014  . 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 Stress 09/18/2013    Past Surgical History:  Procedure Laterality Date  . BIOPSY  04/10/2015   Procedure: BIOPSY;  Surgeon: Daneil Dolin, MD;  Location: [facility];  Service: Endoscopy;;  . cath self every nite     for sodium bicarb injection (discontinued 2013)  . CHOLECYSTECTOMY  2005   biliary dyskinesia  . COLONOSCOPY  JUN 2012 ABD PN/DIARRHEA WITH PROPOFOL   NL COLON  . COLONOSCOPY WITH PROPOFOL N/A 01/16/2021   Procedure: COLONOSCOPY WITH PROPOFOL;  Surgeon: Harvel Quale, MD;  Location[facility];  Service: Gastroenterology;  Laterality: N/A;  1:45  . DILATION AND CURETTAGE OF UTERUS    . DILITATION & CURRETTAGE/HYSTROSCOPY WITH NOVASURE ABLATION N/A 03/24/2017   Procedure: DILATATION & CURETTAGE/HYSTEROSCOPY WITH NOVASURE ENDOMETRIAL ABLATION;  Surgeon: Jonnie Kind, MD;  Location: [facility];  Service: Gynecology;  Laterality: N/A;  . ESOPHAGEAL DILATION N/A 04/10/2015   Procedure: ESOPHAGEAL DILATION WITH 54FR MALONEY DILATOR;  Surgeon: Daneil Dolin, MD;  Location: [facility];  Service: Endoscopy;  Laterality: N/A;  . ESOPHAGOGASTRODUODENOSCOPY    . ESOPHAGOGASTRODUODENOSCOPY (EGD) WITH PROPOFOL N/A 04/10/2015   Procedure: ESOPHAGOGASTRODUODENOSCOPY (EGD) WITH PROPOFOL;  Surgeon: Daneil Dolin, MD;  Location: [facility];  Service: Endoscopy;  Laterality: N/A;  . ESOPHAGOGASTRODUODENOSCOPY (EGD) WITH PROPOFOL N/A 12/06/2016   Procedure: ESOPHAGOGASTRODUODENOSCOPY (EGD) WITH PROPOFOL;  Surgeon: Danie Binder, MD;  [facility];  Service: Endoscopy;  Laterality: N/A;  . ESOPHAGOGASTRODUODENOSCOPY (EGD) WITH PROPOFOL N/A 04/27/2019   Procedure: ESOPHAGOGASTRODUODENOSCOPY (EGD) WITH  PROPOFOL;  Surgeon: Rogene Houston, MD;  [facility];  Service: Endoscopy;  Laterality: N/A;  730  . ESOPHAGOGASTRODUODENOSCOPY (EGD) WITH PROPOFOL N/A 08/31/2019   Procedure: ESOPHAGOGASTRODUODENOSCOPY (EGD) WITH PROPOFOL;  Surgeon: Rogene Houston, MD;  [facility];  Service: Endoscopy;  Laterality: N/A;  . ESOPHAGOGASTRODUODENOSCOPY (EGD) WITH PROPOFOL N/A 01/16/2021   Procedure: ESOPHAGOGASTRODUODENOSCOPY (EGD) WITH PROPOFOL;  Surgeon: Harvel Quale, MD;  [facility];  Service: Gastroenterology;  Laterality: N/A;  . GAB  2007   in [place]-POUCH 5 CM  . GASTRIC BYPASS  06/2006  . HYSTEROSCOPY WITH D & C N/A 09/12/2014   Procedure: DILATATION AND CURETTAGE /HYSTEROSCOPY;  Surgeon: Jonnie Kind, MD;  Location: [facility];  Service: Gynecology;  Laterality: N/A;  . KYPHOPLASTY N/A 08/02/2018   Procedure: KYPHOPLASTY T12;  Surgeon: Melina Schools, MD;  Location: [facility];  Service: Orthopedics;  Laterality: N/A;  60 mins  . LAPAROSCOPIC TUBAL LIGATION Bilateral 03/24/2017   Procedure: LAPAROSCOPIC TUBAL LIGATION ([place] Rings);  Surgeon: Tilda Burrow, MD;  Location: [facility];  Service: Gynecology;  Laterality: Bilateral;  . REPAIR VAGINAL CUFF N/A 07/30/2014   Procedure: REPAIR VAGINAL CUFF;  Surgeon: Catalina Antigua, MD;  Location: [facility];  Service: Gynecology;  Laterality: N/A;  . SAVORY DILATION  06/20/2012   Dr. Cyndi Bender gastritis/Ulcer in the mid jejunum. Empiric dilation.   . SURAL NERVE BX Left 02/25/2016   Procedure: LEFT SURAL NERVE BIOPSY;  Surgeon: Shirlean Kelly, MD;  [facility];  Service: Neurosurgery;  Laterality: Left;  Left sural nerve biopsy  . TONSILLECTOMY    . TONSILLECTOMY AND ADENOIDECTOMY    . UPPER GASTROINTESTINAL ENDOSCOPY  JULY 2011 NAUSEA-D125,V6, PH 25   Bx; GASTRITIS, POUCH-5 CM LONG  . WISDOM TOOTH EXTRACTION      Family Psychiatric History: see below  Family History:  Family History  Problem  Relation Age of Onset  . Hemochromatosis Maternal Grandmother   . Migraines Maternal Grandmother   . Cancer Maternal Grandmother   . Breast cancer Maternal Grandmother   . Hypertension Father   . Diabetes Father   . Coronary artery disease Father   . Migraines Paternal Grandmother   . Breast cancer Paternal Grandmother   . Cancer Mother        breast  . Hemochromatosis Mother   . Breast cancer Mother   . Depression Mother   . Anxiety disorder Mother   . Coronary artery disease Paternal Grandfather   . Anxiety disorder Brother   . Bipolar disorder Brother   . Healthy Daughter   . Healthy Son     Social History:  Social History   Socioeconomic History  . Marital status: Divorced    Spouse name: Not on file  . Number of children: 2  . Years of education: some college  . Highest education level: Some college, no degree  Occupational History  . Occupation: Applying for Social Security Disability    Employer: NOT EMPLOYED  Tobacco Use  . Smoking status: Former Smoker    Packs/day: 0.25    Years: 6.00    Pack years: 1.50    Types: Cigarettes    Start date: 05/2018  . Smokeless tobacco: Never Used  Vaping Use  . Vaping Use: Some days  Substance and Sexual Activity  . Alcohol use: Not Currently    Alcohol/week: 0.0 standard drinks  . Drug use: Not Currently  . Sexual activity: Yes    Partners: Male    Birth control/protection: Surgical, Condom    Comment: tubal and ablation  Other Topics Concern  . Not on file  Social History Narrative   Patient is right handed.   Patient drinks one cup of coffee daily.   She lives in a one-story home with her two children (28 year old son, 81 month old daughter).   Highest level of education: some college   Previously worked as Museum/gallery exhibitions officer in the ER at [employer] Strain: High Risk  . Difficulty of Paying Living Expenses: Very hard  Food Insecurity: No Food Insecurity  . Worried About  Programme researcher, broadcasting/film/video in the Last Year: Never true  . Ran Out of Food in the Last Year: Never true  Transportation Needs: No Transportation Needs  . Lack of Transportation (Medical): No  . Lack of  Transportation (Non-Medical): No  Physical Activity: Inactive  . Days of Exercise per Week: 0 days  . Minutes of Exercise per Session: 0 min  Stress: Stress Concern Present  . Feeling of Stress : Very much  Social Connections: Moderately Isolated  . Frequency of Communication with Friends and Family: More than three times a week  . Frequency of Social Gatherings with Friends and Family: More than three times a week  . Attends Religious Services: More than 4 times per year  . Active Member of Clubs or Organizations: No  . Attends Banker Meetings: Never  . Marital Status: Divorced    Allergies:  Allergies  Allergen Reactions  . Gabapentin Other (See Comments)    Pt states that she was unresponsive after taking this medication, but her vitals remained stable.    . Metoclopramide Hcl Anxiety and Other (See Comments)    Pt states that she felt like she was trapped in a box, and could not get out.  Pt also states that she had temporary loss of movement, weakness, and tingling.    . Tramadol Other (See Comments)    Seizures  . Ativan [Lorazepam] Other (See Comments)    combative  . Latex Itching, Rash and Other (See Comments)    Burns  . Lyrica [Pregabalin] Other (See Comments)    suicidal  . Iron Other (See Comments)    PATIENT IS INTOLERANT TO ORAL IRON , SHE DOES NOT ABSORB IT.    Marland Kitchen Sulfa Antibiotics Hives  . Butrans [Buprenorphine] Rash    Patch caused rash, didn't help with pain  . Nucynta [Tapentadol] Nausea And Vomiting  . Propofol Nausea And Vomiting  . Tape Hives, Itching and Rash    Please use paper tape  . Toradol [Ketorolac Tromethamine] Anxiety    Metabolic Disorder Labs: Lab Results  Component Value Date   HGBA1C 5.7 (H) 01/15/2015   Lab Results   Component Value Date   PROLACTIN 80.0 (H) 05/09/2015   Lab Results  Component Value Date   CHOL 95 03/10/2012   TRIG 50 03/10/2012   HDL 38 (L) 03/10/2012   CHOLHDL 2.5 03/10/2012   VLDL 10 03/10/2012   LDLCALC 47 03/10/2012   Lab Results  Component Value Date   TSH 1.350 01/01/2021   TSH 2.616 08/25/2019    Therapeutic Level Labs: No results found for: LITHIUM Lab Results  Component Value Date   VALPROATE 66 06/23/2020   No components found for:  CBMZ  Current Medications: Current Outpatient Medications  Medication Sig Dispense Refill  . AIMOVIG 70 MG/ML SOAJ Inject 70 mg as directed every 30 (thirty) days. (Patient not taking: Reported on 02/24/2021)    . albuterol (VENTOLIN HFA) 108 (90 Base) MCG/ACT inhaler Inhale 2 puffs into the lungs every 6 (six) hours as needed for wheezing or shortness of breath. (Patient not taking: Reported on 02/24/2021)    . amphetamine-dextroamphetamine (ADDERALL XR) 30 MG 24 hr capsule Take 1 capsule (30 mg total) by mouth every morning. 30 capsule 0  . amphetamine-dextroamphetamine (ADDERALL XR) 30 MG 24 hr capsule Take 1 capsule (30 mg total) by mouth every morning. 30 capsule 0  . budesonide-formoterol (SYMBICORT) 160-4.5 MCG/ACT inhaler Inhale 2 puffs into the lungs 2 (two) times daily. (Patient not taking: Reported on 02/24/2021) 1 Inhaler 5  . Buprenorphine HCl (BELBUCA) 600 MCG FILM Place 600 mcg inside cheek 2 (two) times daily.    . busPIRone (BUSPAR) 10 MG tablet Take 1 tablet (10  mg total) by mouth 3 (three) times daily. 90 tablet 2  . Calcium Carb-Cholecalciferol (CALCIUM-VITAMIN D3) 600-500 MG-UNIT CAPS Take 1 tablet by mouth daily.    . Carboxymethylcellul-Glycerin (LUBRICATING EYE DROPS OP) Place 1 drop into both eyes daily as needed (dry eyes). (Patient not taking: Reported on 02/24/2021)    . cetirizine (ZYRTEC) 10 MG tablet Take 10 mg by mouth daily.    . clidinium-chlordiazePOXIDE (LIBRAX) 5-2.5 MG capsule Take 1 capsule by  mouth 2 (two) times daily. 60 capsule 3  . clonazePAM (KLONOPIN) 0.5 MG tablet Take 1 tablet (0.5 mg total) by mouth 2 (two) times daily. 60 tablet 2  . cyclobenzaprine (FLEXERIL) 10 MG tablet Take 10 mg by mouth 3 (three) times daily as needed for muscle spasms.    . DULoxetine (CYMBALTA) 60 MG capsule Take 1 capsule (60 mg total) by mouth 2 (two) times daily. 180 capsule 2  . fluconazole (DIFLUCAN) 150 MG tablet One po qd prn yeast infection; may repeat in 3-4 days if needed 2 tablet 0  . HYDROmorphone (DILAUDID) 4 MG tablet Take 4 mg by mouth every 4 (four) hours as needed for severe pain.    Marland Kitchen lansoprazole (PREVACID) 30 MG capsule Take 1 capsule (30 mg total) by mouth 2 (two) times daily before a meal. 180 capsule 0  . levalbuterol (XOPENEX HFA) 45 MCG/ACT inhaler INHALE (2) PUFFS INTO THE LUNGS EVERY SIX HOURS AS NEEDED FOR WHEEZING. 15 g 3  . levalbuterol (XOPENEX) 1.25 MG/3ML nebulizer solution Take 1.25 mg by nebulization every 4 (four) hours as needed for wheezing. 50 mL 3  . linaclotide (LINZESS) 290 MCG CAPS capsule Take 1 capsule (290 mcg total) by mouth daily before breakfast. 90 capsule 3  . naloxegol oxalate (MOVANTIK) 12.5 MG TABS tablet Take 1 tablet (12.5 mg total) by mouth daily. 90 tablet 3  . ondansetron (ZOFRAN) 8 MG tablet Take 1 tablet (8 mg total) by mouth every 8 (eight) hours as needed for nausea. 12 tablet 3  . ondansetron (ZOFRAN-ODT) 8 MG disintegrating tablet Take 1 tablet (8 mg total) by mouth every 8 (eight) hours as needed. 50 tablet 3  . Pediatric Multivit-Minerals-C (FLINTSTONES GUMMIES) chewable tablet Chew 3 tablets by mouth daily.    . promethazine (PHENERGAN) 12.5 MG tablet Take 1 tablet (12.5 mg total) by mouth every 8 (eight) hours as needed for nausea or vomiting. 30 tablet 0  . promethazine (PHENERGAN) 25 MG tablet Take 25 mg by mouth 2 (two) times daily as needed for nausea or vomiting.    Marland Kitchen rOPINIRole (REQUIP) 1 MG tablet Take one tablet po each evening.  (Patient taking differently: Take 1 mg by mouth at bedtime.) 30 tablet 5  . sucralfate (CARAFATE) 1 g tablet Take 1 tablet (1 g total) by mouth 4 (four) times daily -  with meals and at bedtime. 90 tablet 0  . traZODone (DESYREL) 100 MG tablet Take 2 tablets (200 mg total) by mouth at bedtime. 60 tablet 2  . valACYclovir (VALTREX) 1000 MG tablet TAKE 1 TABLET BY MOUTH DAILY. (Patient taking differently: Take 1,000 mg by mouth daily as needed (fever blisters).) 90 tablet PRN  . Vitamin D, Ergocalciferol, (DRISDOL) 1.25 MG (50000 UNIT) CAPS capsule Take 50,000 Units by mouth every Wednesday.     No current facility-administered medications for this visit.     Musculoskeletal: Strength & Muscle Tone: within normal limits Gait & Station: normal Patient leans: N/A  Psychiatric Specialty Exam: Review of Systems  Gastrointestinal:  Positive for abdominal pain, constipation, nausea and vomiting.  Psychiatric/Behavioral: Positive for sleep disturbance.  All other systems reviewed and are negative.   There were no vitals taken for this visit.There is no height or weight on file to calculate BMI.  General Appearance: Casual and Fairly Groomed  Eye Contact:  Good  Speech:  Clear and Coherent  Volume:  Normal  Mood:  Euthymic  Affect:  Appropriate and Congruent  Thought Process:  Goal Directed  Orientation:  Full (Time, Place, and Person)  Thought Content: Rumination   Suicidal Thoughts:  No  Homicidal Thoughts:  No  Memory:  Immediate;   Good Recent;   Good Remote;   Fair  Judgement:  Good  Insight:  Good  Psychomotor Activity:  Decreased  Concentration:  Concentration: Good and Attention Span: Good  Recall:  Good  Fund of Knowledge: Good  Language: Good  Akathisia:  No  Handed:  Right  AIMS (if indicated): not done  Assets:  Communication Skills Desire for Improvement Physical Health Resilience Social Support Talents/Skills  ADL's:  Intact  Cognition: WNL  Sleep:  Good    Screenings: AIMS   Flowsheet Row Admission (Discharged) from 06/19/2020 in [facility]  AIMS Total Score 0    AUDIT   Flowsheet Row Patient Outreach Telephone from 07/14/2020 in [facility] Coordination Admission (Discharged) from 06/19/2020 in [facility] ED to Hosp-Admission (Discharged) from 05/09/2015 in [facility]  Alcohol Use Disorder Identification Test Final Score (AUDIT) 1 1 0    GAD-7   Flowsheet Row Counselor from 12/11/2018 in [facility] Counselor from 02/07/2017 in [facility]  Total GAD-7 Score 19 17    PHQ2-9   Flowsheet Row Video Visit from 03/12/2021 in [facility] from [facility]/14/2021 in [facility] Patient Outreach Telephone from 07/14/2020 in [facility] ED from 06/17/2020 in [facility] Counselor from 12/11/2018 in [facility]  PHQ-2 Total Score 0 $Remov'6 4 4 6  'AGjeds$ PHQ-9 Total Score -- $Remove'15 19 22 22    'cvmpIZt$ Flowsheet Row Video Visit from 03/12/2021 in [facility] Admission (Discharged) from 01/16/2021 in [facility] from 01/14/2021 in [facility] CATEGORY No Risk No Risk No Risk       Assessment and Plan: This patient is a 35 year old female with a history of depression anxiety ADHD borderline personality and insomnia.  She seems to be doing better.  We never were able to get her Rexulti approved but her mood has gotten better simply by spending more time with family particular her grandmother.  For now she will continue Cymbalta 120 mg daily for depression, BuSpar 10 mg 3 times daily for anxiety  clonazepam 0.5 mg twice daily for anxiety, trazodone 200 mg at bedtime for sleep and Adderall XR 30 mg every morning for focus.  She will return to see me in 2 months   [name], MD 03/12/2021, 10:32 AM

## 2021-03-13 ENCOUNTER — Encounter: Payer: Self-pay | Admitting: Family Medicine

## 2021-03-13 ENCOUNTER — Encounter (INDEPENDENT_AMBULATORY_CARE_PROVIDER_SITE_OTHER): Payer: Self-pay

## 2021-03-13 DIAGNOSIS — M810 Age-related osteoporosis without current pathological fracture: Secondary | ICD-10-CM | POA: Diagnosis not present

## 2021-03-13 DIAGNOSIS — K289 Gastrojejunal ulcer, unspecified as acute or chronic, without hemorrhage or perforation: Secondary | ICD-10-CM | POA: Diagnosis not present

## 2021-03-13 DIAGNOSIS — R69 Illness, unspecified: Secondary | ICD-10-CM | POA: Diagnosis not present

## 2021-03-13 DIAGNOSIS — Z9884 Bariatric surgery status: Secondary | ICD-10-CM | POA: Diagnosis not present

## 2021-03-13 DIAGNOSIS — K912 Postsurgical malabsorption, not elsewhere classified: Secondary | ICD-10-CM | POA: Diagnosis not present

## 2021-03-13 DIAGNOSIS — E669 Obesity, unspecified: Secondary | ICD-10-CM | POA: Diagnosis not present

## 2021-03-15 ENCOUNTER — Ambulatory Visit
Admission: EM | Admit: 2021-03-15 | Discharge: 2021-03-15 | Disposition: A | Discharge status: Home / Self Care | Payer: Medicaid Other | Attending: Physician Assistant | Admitting: Physician Assistant

## 2021-03-15 ENCOUNTER — Ambulatory Visit (INDEPENDENT_AMBULATORY_CARE_PROVIDER_SITE_OTHER): Payer: Medicaid Other

## 2021-03-15 ENCOUNTER — Ambulatory Visit: Payer: Self-pay

## 2021-03-15 ENCOUNTER — Encounter: Payer: Self-pay | Admitting: Emergency Medicine

## 2021-03-15 ENCOUNTER — Other Ambulatory Visit: Payer: Self-pay

## 2021-03-15 DIAGNOSIS — M545 Low back pain, unspecified: Secondary | ICD-10-CM

## 2021-03-15 DIAGNOSIS — G8929 Other chronic pain: Secondary | ICD-10-CM | POA: Diagnosis not present

## 2021-03-15 DIAGNOSIS — S32010S Wedge compression fracture of first lumbar vertebra, sequela: Secondary | ICD-10-CM

## 2021-03-15 DIAGNOSIS — W19XXXA Unspecified fall, initial encounter: Secondary | ICD-10-CM

## 2021-03-15 MED ORDER — CYCLOBENZAPRINE HCL 10 MG PO TABS: 10.0000 mg | ORAL_TABLET | Freq: Three times a day (TID) | ORAL | 0 refills | Status: DC | PRN

## 2021-03-15 NOTE — Discharge Instructions (Signed)
Follow up with orthopedic physician  Take medication as prescribed

## 2021-03-15 NOTE — ED Triage Notes (Signed)
Fell Thursday night.  States she is having back pain from t12 - L1.  Hx back fx.

## 2021-03-17 ENCOUNTER — Other Ambulatory Visit: Payer: Self-pay | Admitting: Allergy & Immunology

## 2021-03-17 ENCOUNTER — Other Ambulatory Visit: Payer: Self-pay | Admitting: Neurology

## 2021-03-17 ENCOUNTER — Other Ambulatory Visit (INDEPENDENT_AMBULATORY_CARE_PROVIDER_SITE_OTHER): Payer: Self-pay | Admitting: Gastroenterology

## 2021-03-17 ENCOUNTER — Encounter (INDEPENDENT_AMBULATORY_CARE_PROVIDER_SITE_OTHER): Payer: Self-pay

## 2021-03-17 DIAGNOSIS — K589 Irritable bowel syndrome without diarrhea: Secondary | ICD-10-CM

## 2021-03-17 MED ORDER — CILIDINIUM-CHLORDIAZEPOXIDE 2.5-5 MG PO CAPS: 1.0000 | ORAL_CAPSULE | Freq: Three times a day (TID) | ORAL | 5 refills | Status: DC

## 2021-03-18 ENCOUNTER — Encounter (INDEPENDENT_AMBULATORY_CARE_PROVIDER_SITE_OTHER): Payer: Self-pay

## 2021-03-18 ENCOUNTER — Other Ambulatory Visit: Payer: Self-pay | Admitting: General Surgery

## 2021-03-18 DIAGNOSIS — K912 Postsurgical malabsorption, not elsewhere classified: Secondary | ICD-10-CM | POA: Diagnosis not present

## 2021-03-18 DIAGNOSIS — R69 Illness, unspecified: Secondary | ICD-10-CM

## 2021-03-18 NOTE — ED Provider Notes (Signed)
Penny [name]    CSN: [id number] Arrival date & time: 03/15/21  1055      History   Chief Complaint Chief Complaint  Patient presents with  . Back Pain    HPI Penny [name] is a 35 y.o. female.   Pt with h/o compression fracture T12 and L1, kyphoplasty to T12 presents with increased back pain and tailbone pain after a mechanical fall 3 days ago at home.  Reports she landed on her tail bone.  She denies radiation of pain.  She experiences chronic numbness and tingling in her legs which she associates with previous back problems.  Reports this is unchanged today.  Denies saddle anesthesia.  She has taken flexeril with temporary relief, reports she is now out of flexeril.      Past Medical History:  Diagnosis Date  . ADD (attention deficit disorder)   . Anemia 2011   2o to GASTRIC BYPASS  . Anginal pain ([place])   . Anxiety   . Asthma   . Blood transfusion without reported diagnosis   . Chronic daily headache   . Depression   . Depression   . Dysmenorrhea 09/18/2013  . Dysrhythmia   . Elevated liver enzymes JUL 2011ALK PHOS 111-127 AST  143-267 ALT  213-321T BILI 0.6  ALB  3.7-4.06 Jun 2011 ALK PHOS 118 AST 24 ALT 42 T BILI 0.4 ALB 3.9  . Encounter for drug rehabilitation    at behavioral health for opioid addiction about 4 years ago  . Family history of adverse reaction to anesthesia    'dad had to be kept on pump for breathing for morphine'  . Fatty liver   . Fibroid 01/18/2017  . Fibromyalgia   . Gastric bypass status for obesity   . Gastritis JULY 2011  . Heart murmur   . 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 History of Holter monitoring   . Hx of opioid abuse ([place])    for about 4 years, about 4 years ago  . Interstitial cystitis   . Iron deficiency anemia 07/23/2010  . Irritable bowel syndrome 2012 DIARRHEA   JUN 2012 TTG IgA 14.9  . IUD FEB 2010  . Lupus ([place])   . Menorrhagia 07/18/2013  . Migraines   . Obesity (BMI 30-39.9)  2011 228 LBS BMI 36.8  . Osteoporosis   . Ovarian cyst   . Patient desires pregnancy 09/18/2013  . Polyneuropathy   . PONV (postoperative nausea and vomiting) seizure post-operatively   seizure following ankle surgery  . Potassium (K) deficiency   . Pregnant 12/25/2013  . Psychiatric pseudoseizure   . RLQ abdominal pain 07/18/2013  . Sciatica of left side 11/14/2014  . 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 Stress 09/18/2013    Patient Active Problem List   Diagnosis Date Noted  . Chronic prescription opiate use 12/25/2020  . 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 Constipation 12/25/2020  . Major depressive disorder, recurrent episode ([place]) 06/22/2020  . Major depression 06/19/2020  . Dehydration   . Chronic abdominal pain 09/19/2019  . Intractable nausea and vomiting 09/19/2019  . Gastroesophageal reflux disease without esophagitis   . Cannabis abuse 08/30/2019  . Dyspnea   . Severe anxiety 08/21/2019  . Hematemesis with nausea 04/23/2019  . Absolute anemia 04/23/2019  . Urge incontinence 10/26/2018  . S/P kyphoplasty 08/02/2018  . Herpes genitalis in women 12/19/2017  . Cannabis use disorder, moderate, dependence ([place]) 07/07/2017  . Abnormal urine  odor 03/30/2017  . Urinary tract infection with hematuria 03/30/2017  . Screening for genitourinary condition 03/30/2017  . Pain 03/30/2017  . Burning with urination 03/30/2017  . Family history of breast cancer in mother,uncertain BR CA status 02/28/2017  . Fibroid 01/18/2017  . Chronic pain syndrome 12/05/2016  . Folate deficiency 12/04/2016  . Nausea vomiting and diarrhea 12/04/2016  . Severe anemia 12/04/2016  . Symptomatic anemia 12/03/2016  . Anastomotic ulcer 04/22/2016  . Pain medication agreement signed 04/22/2016  . Complaints of leg weakness   . Paresthesia   . Left-sided weakness   . Uncontrolled pain 01/24/2016  . Malnutrition of moderate degree 01/24/2016  . Neuropathy 01/23/2016  . Inability  to walk 01/23/2016  . Paresthesias   . Bilateral leg numbness 11/26/2015  . Major depressive disorder, recurrent episode, 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[place]) 05/10/2015  . Post partum depression 05/09/2015  . Dysphagia   . Hematemesis 04/07/2015  . Intractable vomiting with nausea 04/07/2015  . Elevated liver enzymes   . Epigastric pain   . Pseudoseizure ([place]) 02/22/2015  . Seizure-like activity ([place]) 02/22/2015  . Fibromyalgia 02/18/2015  . Vulvar fissure 02/18/2015  . Clinical depression 02/01/2015  . Current smoker 01/29/2015  . Current tobacco use 01/29/2015  . 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 Leg weakness, bilateral 01/02/2015  . Gait disorder 01/02/2015  . Other symptoms and signs involving the musculoskeletal system 01/02/2015  . Abnormal gait 01/02/2015  . Cervicalgia 12/18/2014  . Sciatica of left side 11/14/2014  . Neuralgia neuritis, sciatic nerve 11/14/2014  . Pseudoseizures ([employer]) 09/12/2014  . Encounter for sterilization 09/09/2014  . Abdominal pain, acute, right lower quadrant 08/22/2014  . Headache, migraine 08/19/2014  . Seizure ([place]) 08/18/2014  . Encephalopathy 08/13/2014  . Headache 08/13/2014  . Altered mental status 08/13/2014  . Right leg numbness 07/31/2014  . Disturbance of skin sensation 07/31/2014  . Hypertension in pregnancy, transient 07/08/2014  . Previous gastric bypass affecting pregnancy, antepartum 05/22/2014  . History of bariatric surgery 05/22/2014  . 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 Antepartum mental disorder in pregnancy 05/09/2014  . Rapid palpitations 02/18/2014  . H/O maternal third degree perineal laceration, currently pregnant 02/18/2014  . High-risk pregnancy 02/18/2014  . Supervision of pregnancy with other poor reproductive or obstetric history, unspecified trimester 02/18/2014  . Restless legs 01/16/2014  . Restless leg 01/16/2014   . Chronic interstitial cystitis 10/23/2013  . Menorrhagia 07/18/2013  . Excessive and frequent menstruation 07/18/2013  . h/o Opiate addiction 03/11/2012    Class: Acute  . History of migraine headaches 03/10/2012    Class: Acute  . Depression with anxiety 03/10/2012    Class: Chronic  . 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 Attention deficit hyperactivity disorder, 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    Class: Chronic  . Panic disorder without agoraphobia 03/10/2012  . H/O disease 03/10/2012  . Dysthymia 03/10/2012  . 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 Other specified behavioral and emotional disorders with onset usually occurring in childhood and adolescence 03/10/2012  . Pelvic congestion syndrome 10/13/2011  . Coitalgia 10/13/2011  . Chronic migraine without aura 10/13/2011  . Unspecified dyspareunia 10/13/2011  . Other specified conditions associated with female genital organs and menstrual cycle 10/13/2011  . IBS (irritable bowel syndrome) 08/25/2011  . Diarrhea 05/27/2011  . OBESITY, UNSPECIFIED 09/17/2010  . Transaminitis 09/17/2010  .  IDA (iron deficiency anemia) 07/23/2010    Past Surgical History:  Procedure Laterality Date  . BIOPSY  04/10/2015   Procedure: BIOPSY;  Surgeon: Daneil Dolin, MD;  Location: [facility];  Service: Endoscopy;;  . cath self every nite     for sodium bicarb injection (discontinued 2013)  . CHOLECYSTECTOMY  2005   biliary dyskinesia  . COLONOSCOPY  JUN 2012 ABD PN/DIARRHEA WITH PROPOFOL   NL COLON  . COLONOSCOPY WITH PROPOFOL N/A 01/16/2021   Procedure: COLONOSCOPY WITH PROPOFOL;  Surgeon: Harvel Quale, MD;  [facility];  Service: Gastroenterology;  Laterality: N/A;  1:45  . DILATION AND CURETTAGE OF UTERUS    . DILITATION & CURRETTAGE/HYSTROSCOPY WITH NOVASURE ABLATION N/A 03/24/2017   Procedure: DILATATION &  CURETTAGE/HYSTEROSCOPY WITH NOVASURE ENDOMETRIAL ABLATION;  Surgeon: Jonnie Kind, MD;  Location: [facility];  Service: Gynecology;  Laterality: N/A;  . ESOPHAGEAL DILATION N/A 04/10/2015   Procedure: ESOPHAGEAL DILATION WITH 54FR MALONEY DILATOR;  Surgeon: Daneil Dolin, MD;  Location: [facility];  Service: Endoscopy;  Laterality: N/A;  . ESOPHAGOGASTRODUODENOSCOPY    . ESOPHAGOGASTRODUODENOSCOPY (EGD) WITH PROPOFOL N/A 04/10/2015   Procedure: ESOPHAGOGASTRODUODENOSCOPY (EGD) WITH PROPOFOL;  Surgeon: Daneil Dolin, MD;  Location: [facility];  Service: Endoscopy;  Laterality: N/A;  . ESOPHAGOGASTRODUODENOSCOPY (EGD) WITH PROPOFOL N/A 12/06/2016   Procedure: ESOPHAGOGASTRODUODENOSCOPY (EGD) WITH PROPOFOL;  Surgeon: Danie Binder, MD;  [facility];  Service: Endoscopy;  Laterality: N/A;  . ESOPHAGOGASTRODUODENOSCOPY (EGD) WITH PROPOFOL N/A 04/27/2019   Procedure: ESOPHAGOGASTRODUODENOSCOPY (EGD) WITH PROPOFOL;  Surgeon: Rogene Houston, MD;  [facility];  Service: Endoscopy;  Laterality: N/A;  730  . ESOPHAGOGASTRODUODENOSCOPY (EGD) WITH PROPOFOL N/A 08/31/2019   Procedure: ESOPHAGOGASTRODUODENOSCOPY (EGD) WITH PROPOFOL;  Surgeon: Rogene Houston, MD;  [facility];  Service: Endoscopy;  Laterality: N/A;  . ESOPHAGOGASTRODUODENOSCOPY (EGD) WITH PROPOFOL N/A 01/16/2021   Procedure: ESOPHAGOGASTRODUODENOSCOPY (EGD) WITH PROPOFOL;  Surgeon: Harvel Quale, MD;  [facility];  Service: Gastroenterology;  Laterality: N/A;  . GAB  2007   in [place]-POUCH 5 CM  . GASTRIC BYPASS  06/2006  . HYSTEROSCOPY WITH D & C N/A 09/12/2014   Procedure: DILATATION AND CURETTAGE /HYSTEROSCOPY;  Surgeon: Jonnie Kind, MD;  [facility]: [facility];  Service: Gynecology;  Laterality: N/A;  . KYPHOPLASTY N/A 08/02/2018   Procedure: KYPHOPLASTY T12;  Surgeon: Melina Schools, MD;  Location: [facility];  Service: Orthopedics;  Laterality: N/A;  60 mins  . LAPAROSCOPIC TUBAL LIGATION  Bilateral 03/24/2017   Procedure: LAPAROSCOPIC TUBAL LIGATION ([place] Rings);  Surgeon: Jonnie Kind, MD;  Location: [facility];  Service: Gynecology;  Laterality: Bilateral;  . REPAIR VAGINAL CUFF N/A 07/30/2014   Procedure: REPAIR VAGINAL CUFF;  Surgeon: Mora Bellman, MD;  Location: [facility];  Service: Gynecology;  Laterality: N/A;  . SAVORY DILATION  06/20/2012   Dr. Barnie Alderman gastritis/Ulcer in the mid jejunum. Empiric dilation.   . SURAL NERVE BX Left 02/25/2016   Procedure: LEFT SURAL NERVE BIOPSY;  Surgeon: Jovita Gamma, MD;  Location: [facility];  Service: Neurosurgery;  Laterality: Left;  Left sural nerve biopsy  . TONSILLECTOMY    . TONSILLECTOMY AND ADENOIDECTOMY    . UPPER GASTROINTESTINAL ENDOSCOPY  JULY 2011 NAUSEA-D125,V6, PH 25   Bx; GASTRITIS, POUCH-5 CM LONG  . WISDOM TOOTH EXTRACTION      OB History    Gravida  3   Para  2   Term  1  Preterm  1   AB  1   Living  2     SAB  1   IAB      Ectopic      Multiple      Live Births  2            Home Medications    Prior to Admission medications   Medication Sig Start Date End Date Taking? Authorizing Provider  cetirizine (ZYRTEC) 10 MG tablet TAKE ONE TABLET BY MOUTH DAILY 03/17/21   Valentina Shaggy, MD  AIMOVIG 70 MG/ML SOAJ Inject 70 mg as directed every 30 (thirty) days. Patient not taking: Reported on 02/24/2021 07/26/20   [provider]  albuterol (VENTOLIN HFA) 108 (90 Base) MCG/ACT inhaler Inhale 2 puffs into the lungs every 6 (six) hours as needed for wheezing or shortness of breath. Patient not taking: Reported on 02/24/2021    [provider]  amphetamine-dextroamphetamine (ADDERALL XR) 30 MG 24 hr capsule Take 1 capsule (30 mg total) by mouth every morning. 03/12/21   Cloria Spring, MD  amphetamine-dextroamphetamine (ADDERALL XR) 30 MG 24 hr capsule Take 1 capsule (30 mg total) by mouth every morning. 03/12/21 03/12/22  Cloria Spring, MD  budesonide-formoterol  [facility]) 160-4.5 MCG/ACT inhaler Inhale 2 puffs into the lungs 2 (two) times daily. Patient not taking: Reported on 02/24/2021 05/23/20   Valentina Shaggy, MD  Buprenorphine HCl (BELBUCA) 600 MCG FILM Place 600 mcg inside cheek 2 (two) times daily.    [provider]  busPIRone (BUSPAR) 10 MG tablet Take 1 tablet (10 mg total) by mouth 3 (three) times daily. 03/12/21   Cloria Spring, MD  Calcium Carb-Cholecalciferol (CALCIUM-VITAMIN D3) 600-500 MG-UNIT CAPS Take 1 tablet by mouth daily.    [provider]  Carboxymethylcellul-Glycerin (LUBRICATING EYE DROPS OP) Place 1 drop into both eyes daily as needed (dry eyes). Patient not taking: Reported on 02/24/2021    [provider]  clidinium-chlordiazePOXIDE (LIBRAX) 5-2.5 MG capsule Take 1 capsule by mouth 3 (three) times daily before meals. 03/17/21   Harvel Quale, MD  cyclobenzaprine (FLEXERIL) 10 MG tablet Take 1 tablet (10 mg total) by mouth 3 (three) times daily as needed for muscle spasms. 03/15/21   Konrad Felix, PA-C  DULoxetine (CYMBALTA) 60 MG capsule Take 1 capsule (60 mg total) by mouth 2 (two) times daily. 03/12/21   Cloria Spring, MD  fluconazole (DIFLUCAN) 150 MG tablet One po qd prn yeast infection; may repeat in 3-4 days if needed 01/29/21   Kathyrn Drown, MD  HYDROmorphone (DILAUDID) 4 MG tablet Take 4 mg by mouth every 4 (four) hours as needed for severe pain.    [provider]  lansoprazole (PREVACID) 30 MG capsule TAKE ONE CAPSULE BY MOUTH TWICE A DAY BEFORE A MEAL. 03/17/21   Harvel Quale, MD  levalbuterol [facility] HFA) 45 MCG/ACT inhaler INHALE (2) PUFFS INTO THE LUNGS EVERY SIX HOURS AS NEEDED FOR WHEEZING. 02/24/21   Valentina Shaggy, MD  levalbuterol [facility]) 1.25 MG/3ML nebulizer solution Take 1.25 mg by nebulization every 4 (four) hours as needed for wheezing. 02/24/21   Valentina Shaggy, MD  linaclotide [facility]) 290 MCG CAPS capsule Take 1 capsule  (290 mcg total) by mouth daily before breakfast. 01/30/21   Montez Morita, Quillian Quince, MD  naloxegol oxalate (MOVANTIK) 12.5 MG TABS tablet Take 1 tablet (12.5 mg total) by mouth daily. 03/02/21   Harvel Quale, MD  ondansetron (ZOFRAN) 8 MG  tablet Take 1 tablet (8 mg total) by mouth every 8 (eight) hours as needed for nausea. 01/07/21   Kathyrn Drown, MD  ondansetron (ZOFRAN-ODT) 8 MG disintegrating tablet Take 1 tablet (8 mg total) by mouth every 8 (eight) hours as needed. 12/04/20   Kathyrn Drown, MD  Pediatric Multivit-Minerals-C (FLINTSTONES GUMMIES) chewable tablet Chew 3 tablets by mouth daily.    [provider]  promethazine (PHENERGAN) 12.5 MG tablet TAKE 1 TABLET BY MOUTH EVERY 8 HOURS AS NEEDED FOR NAUSEA OR VOMITING 03/17/21   Montez Morita, Quillian Quince, MD  promethazine (PHENERGAN) 25 MG tablet Take 25 mg by mouth 2 (two) times daily as needed for nausea or vomiting.    [provider]  rOPINIRole (REQUIP) 1 MG tablet Take one tablet po each evening. Patient taking differently: Take 1 mg by mouth at bedtime. 10/27/20   Kathyrn Drown, MD  sucralfate (CARAFATE) 1 g tablet Take 1 tablet (1 g total) by mouth 4 (four) times daily -  with meals and at bedtime. 02/17/21 03/19/21  Kathyrn Drown, MD  traZODone (DESYREL) 100 MG tablet Take 2 tablets (200 mg total) by mouth at bedtime. 03/12/21   Cloria Spring, MD  valACYclovir (VALTREX) 1000 MG tablet TAKE 1 TABLET BY MOUTH DAILY. Patient taking differently: Take 1,000 mg by mouth daily as needed (fever blisters). 08/08/20   Estill Dooms, NP  Vitamin D, Ergocalciferol, (DRISDOL) 1.25 MG (50000 UNIT) CAPS capsule Take 50,000 Units by mouth every Wednesday.    [provider]    Family History Family History  Problem Relation Age of Onset  . Hemochromatosis Maternal Grandmother   . Migraines Maternal Grandmother   . Cancer Maternal Grandmother   . Breast cancer Maternal Grandmother   . Hypertension  Father   . Diabetes Father   . Coronary artery disease Father   . Migraines Paternal Grandmother   . Breast cancer Paternal Grandmother   . Cancer Mother        breast  . Hemochromatosis Mother   . Breast cancer Mother   . Depression Mother   . Anxiety disorder Mother   . Coronary artery disease Paternal Grandfather   . Anxiety disorder Brother   . Bipolar disorder Brother   . Healthy Daughter   . Healthy Son     Social History Social History   Tobacco Use  . Smoking status: Former Smoker    Packs/day: 0.25    Years: 6.00    Pack years: 1.50    Types: Cigarettes    Start date: 05/2018  . Smokeless tobacco: Never Used  Vaping Use  . Vaping Use: Some days  Substance Use Topics  . Alcohol use: Not Currently    Alcohol/week: 0.0 standard drinks  . Drug use: Not Currently     Allergies   Gabapentin, Metoclopramide hcl, Tramadol, Ativan [lorazepam], Latex, Lyrica [pregabalin], Iron, Sulfa antibiotics, Butrans [buprenorphine], Nucynta [tapentadol], Propofol, Tape, and Toradol [ketorolac tromethamine]   Review of Systems Review of Systems  Constitutional: Negative for chills and fever.  HENT: Negative for ear pain and sore throat.   Eyes: Negative for pain and visual disturbance.  Respiratory: Negative for cough and shortness of breath.   Cardiovascular: Negative for chest pain and palpitations.  Gastrointestinal: Negative for abdominal pain and vomiting.  Genitourinary: Negative for dysuria and hematuria.  Musculoskeletal: Positive for back pain. Negative for arthralgias.  Skin: Negative for color change and rash.  Neurological: Negative for seizures and syncope.  All other systems reviewed and are negative.    Physical Exam Triage Vital Signs ED Triage Vitals  Enc Vitals Group     BP 03/15/21 1108 114/70     Pulse Rate 03/15/21 1106 (!) 102     Resp 03/15/21 1106 17     Temp 03/15/21 1106 98.1 F (36.7 C)     Temp Source 03/15/21 1106 Oral     SpO2  03/15/21 1106 97 %     Weight --      Height --      Head Circumference --      Peak Flow --      Pain Score 03/15/21 1108 9     Pain Loc --      Pain Edu? --      Excl. in GC? --    No data found.  Updated Vital Signs BP 114/70 (BP Location: Right Arm)   Pulse (!) 102   Temp 98.1 F (36.7 C) (Oral)   Resp 17   SpO2 97%   Visual Acuity Right Eye Distance:   Left Eye Distance:   Bilateral Distance:    Right Eye Near:   Left Eye Near:    Bilateral Near:     Physical Exam Vitals and nursing note reviewed.  Constitutional:      General: She is not in acute distress.    Appearance: She is well-developed.  HENT:     Head: Normocephalic and atraumatic.  Eyes:     Conjunctiva/sclera: Conjunctivae normal.  Cardiovascular:     Rate and Rhythm: Normal rate and regular rhythm.     Heart sounds: No murmur heard.   Pulmonary:     Effort: Pulmonary effort is normal. No respiratory distress.     Breath sounds: Normal breath sounds.  Abdominal:     Palpations: Abdomen is soft.     Tenderness: There is no abdominal tenderness.  Musculoskeletal:     Cervical back: Neck supple.     Lumbar back: Spasms and tenderness present. Negative right straight leg raise test and negative left straight leg raise test.     Comments: TTP to bilateral lumbar paraspinal musculature  Skin:    General: Skin is warm and dry.  Neurological:     Mental Status: She is alert.      UC Treatments / Results  Labs (all labs ordered are listed, but only abnormal results are displayed) Labs Reviewed - No data to display  EKG   Radiology No results found.  Procedures Procedures (including critical care time)  Medications Ordered in UC Medications - No data to display  Initial Impression / Assessment and Plan / UC Course  I have reviewed the triage vital signs and the nursing notes.  Pertinent labs & imaging results that were available during my care of the patient were reviewed by me  and considered in my medical decision making (see chart for details).     Xrays today which shows L1 fracture, 40-50% increased since previous imaging.  Pt has a back brace, she will start wearing this.  Flexeril refilled today.  She will contact the ortho physician tomorrow. Return precautions discussed.  Final Clinical Impressions(s) / UC Diagnoses   Final diagnoses:  Chronic low back pain without sciatica, unspecified back pain laterality  Closed compression fracture of L1 lumbar vertebra, sequela     Discharge Instructions     Follow up with orthopedic physician  Take medication as prescribed    ED Prescriptions  Medication Sig Dispense Auth. Provider   cyclobenzaprine (FLEXERIL) 10 MG tablet Take 1 tablet (10 mg total) by mouth 3 (three) times daily as needed for muscle spasms. 30 tablet Konrad Felix, PA-C     PDMP not reviewed this encounter.   Konrad Felix, PA-C 03/18/21 1527

## 2021-03-19 ENCOUNTER — Encounter (HOSPITAL_COMMUNITY): Payer: Self-pay

## 2021-03-19 ENCOUNTER — Encounter (HOSPITAL_COMMUNITY): Payer: Self-pay | Admitting: *Deleted

## 2021-03-19 ENCOUNTER — Emergency Department (HOSPITAL_COMMUNITY)
Admission: EM | Admit: 2021-03-19 | Discharge: 2021-03-20 | Disposition: A | Discharge status: Home / Self Care | Payer: Medicaid Other | Attending: Emergency Medicine | Admitting: Emergency Medicine

## 2021-03-19 ENCOUNTER — Other Ambulatory Visit: Payer: Self-pay

## 2021-03-19 DIAGNOSIS — F419 Anxiety disorder, unspecified: Secondary | ICD-10-CM | POA: Diagnosis not present

## 2021-03-19 DIAGNOSIS — F4310 Post-traumatic stress disorder, unspecified: Secondary | ICD-10-CM | POA: Diagnosis not present

## 2021-03-19 DIAGNOSIS — M545 Low back pain, unspecified: Secondary | ICD-10-CM | POA: Insufficient documentation

## 2021-03-19 NOTE — ED Provider Notes (Signed)
Patient placed in Quick Look pathway, seen and evaluated   Chief Complaint: low back pain  HPI:   Pt reports she is in pain management. Pt reports dilaudid is not working   ROS: no fever, no uti symptoms   Physical Exam:   Gen: No distress  Neuro: Awake and Alert  Skin: Warm    Focused Exam: Lungs clear Heart rrr   Initiation of care has begun. The patient has been counseled on the process, plan, and necessity for staying for the completion/evaluation, and the remainder of the medical screening examination   Sidney Ace 03/19/21 Geanie Berlin, MD 03/27/21 [phone]

## 2021-03-19 NOTE — ED Triage Notes (Signed)
States she is having increased back pain and is having difficulty walking

## 2021-03-20 ENCOUNTER — Encounter (INDEPENDENT_AMBULATORY_CARE_PROVIDER_SITE_OTHER): Payer: Self-pay

## 2021-03-22 ENCOUNTER — Encounter (HOSPITAL_COMMUNITY): Payer: Self-pay | Admitting: *Deleted

## 2021-03-22 ENCOUNTER — Emergency Department (HOSPITAL_COMMUNITY)
Admission: EM | Admit: 2021-03-22 | Discharge: 2021-03-22 | Disposition: A | Discharge status: Home / Self Care | Payer: Medicaid Other | Attending: Emergency Medicine | Admitting: Emergency Medicine

## 2021-03-22 ENCOUNTER — Other Ambulatory Visit: Payer: Self-pay

## 2021-03-22 DIAGNOSIS — L02413 Cutaneous abscess of right upper limb: Secondary | ICD-10-CM | POA: Diagnosis not present

## 2021-03-22 DIAGNOSIS — Z91040 Latex allergy status: Secondary | ICD-10-CM | POA: Diagnosis not present

## 2021-03-22 DIAGNOSIS — L0291 Cutaneous abscess, unspecified: Secondary | ICD-10-CM

## 2021-03-22 DIAGNOSIS — Z87891 Personal history of nicotine dependence: Secondary | ICD-10-CM | POA: Diagnosis not present

## 2021-03-22 DIAGNOSIS — J45909 Unspecified asthma, uncomplicated: Secondary | ICD-10-CM | POA: Diagnosis not present

## 2021-03-22 DIAGNOSIS — Z23 Encounter for immunization: Secondary | ICD-10-CM | POA: Insufficient documentation

## 2021-03-22 MED ORDER — ACETAMINOPHEN 325 MG PO TABS
650.0000 mg | ORAL_TABLET | Freq: Once | ORAL | Status: DC
  Filled 2021-03-22: qty 2

## 2021-03-22 MED ORDER — LIDOCAINE-EPINEPHRINE 1 %-1:100000 IJ SOLN
20.0000 mL | Freq: Once | INTRAMUSCULAR | Status: AC
  Administered 2021-03-22: 20 mL via INTRADERMAL
  Filled 2021-03-22: qty 20

## 2021-03-22 MED ORDER — TETANUS-DIPHTH-ACELL PERTUSSIS 5-2.5-18.5 LF-MCG/0.5 IM SUSY
0.5000 mL | PREFILLED_SYRINGE | Freq: Once | INTRAMUSCULAR | Status: AC
  Administered 2021-03-22: 0.5 mL via INTRAMUSCULAR
  Filled 2021-03-22: qty 0.5

## 2021-03-22 NOTE — ED Provider Notes (Signed)
[facility] EMERGENCY DEPARTMENT Provider Note   CSN: [id number] Arrival date & time: 03/22/21  2110     History Chief Complaint  Patient presents with  . Abscess    [name] is a 35 y.o. female.  HPI 35 year old female with complaints today of abscess to right upper shoulder.  States this has been ongoing for about a month but over the last couple days it has been more painful.  No fevers or chills.    Past Medical History:  Diagnosis Date  . ADD (attention deficit disorder)   . Anemia 2011   2o to GASTRIC BYPASS  . Anginal pain ([place])   . Anxiety   . Asthma   . Blood transfusion without reported diagnosis   . Chronic daily headache   . Depression   . Depression   . Dysmenorrhea 09/18/2013  . Dysrhythmia   . Elevated liver enzymes JUL 2011ALK PHOS 111-127 AST  143-267 ALT  213-321T BILI 0.6  ALB  3.7-4.06 Jun 2011 ALK PHOS 118 AST 24 ALT 42 T BILI 0.4 ALB 3.9  . Encounter for drug rehabilitation    at behavioral health for opioid addiction about 4 years ago  . Family history of adverse reaction to anesthesia    'dad had to be kept on pump for breathing for morphine'  . Fatty liver   . Fibroid 01/18/2017  . Fibromyalgia   . Gastric bypass status for obesity   . Gastritis JULY 2011  . Heart murmur   . 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 History of Holter monitoring   . Hx of opioid abuse ([place])    for about 4 years, about 4 years ago  . Interstitial cystitis   . Iron deficiency anemia 07/23/2010  . Irritable bowel syndrome 2012 DIARRHEA   JUN 2012 TTG IgA 14.9  . IUD FEB 2010  . Lupus ([place])   . Menorrhagia 07/18/2013  . Migraines   . Obesity (BMI 30-39.9) 2011 228 LBS BMI 36.8  . Osteoporosis   . Ovarian cyst   . Patient desires pregnancy 09/18/2013  . Polyneuropathy   . PONV (postoperative nausea and vomiting) seizure post-operatively   seizure following ankle surgery  . Potassium (K) deficiency   . Pregnant 12/25/2013  .  Psychiatric pseudoseizure   . RLQ abdominal pain 07/18/2013  . Sciatica of left side 11/14/2014  . 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 Stress 09/18/2013    Patient Active Problem List   Diagnosis Date Noted  . Chronic prescription opiate use 12/25/2020  . 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 Constipation 12/25/2020  . Major depressive disorder, recurrent episode ([place]) 06/22/2020  . Major depression 06/19/2020  . Dehydration   . Chronic abdominal pain 09/19/2019  . Intractable nausea and vomiting 09/19/2019  . Gastroesophageal reflux disease without esophagitis   . Cannabis abuse 08/30/2019  . Dyspnea   . Severe anxiety 08/21/2019  . Hematemesis with nausea 04/23/2019  . Absolute anemia 04/23/2019  . Urge incontinence 10/26/2018  . S/P kyphoplasty 08/02/2018  . Herpes genitalis in women 12/19/2017  . Cannabis use disorder, moderate, dependence ([place]) 07/07/2017  . Abnormal urine odor 03/30/2017  . Urinary tract infection with hematuria 03/30/2017  . Screening for genitourinary condition 03/30/2017  . Pain 03/30/2017  . Burning with urination 03/30/2017  . Family history of breast cancer in mother,uncertain BR CA status 02/28/2017  . Fibroid 01/18/2017  . Chronic pain syndrome 12/05/2016  . Folate deficiency  12/04/2016  . Nausea vomiting and diarrhea 12/04/2016  . Severe anemia 12/04/2016  . Symptomatic anemia 12/03/2016  . Anastomotic ulcer 04/22/2016  . Pain medication agreement signed 04/22/2016  . Complaints of leg weakness   . Paresthesia   . Left-sided weakness   . Uncontrolled pain 01/24/2016  . Malnutrition of moderate degree 01/24/2016  . Neuropathy 01/23/2016  . Inability to walk 01/23/2016  . Paresthesias   . Bilateral leg numbness 11/26/2015  . 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 Post partum depression 05/09/2015  . Dysphagia   . Hematemesis 04/07/2015  . Intractable vomiting with  nausea 04/07/2015  . Elevated liver enzymes   . Epigastric pain   . Pseudoseizure ([place]) 02/22/2015  . Seizure-like activity ([place]) 02/22/2015  . Fibromyalgia 02/18/2015  . Vulvar fissure 02/18/2015  . Clinical depression 02/01/2015  . Current smoker 01/29/2015  . Current tobacco use 01/29/2015  . 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 Leg weakness, bilateral 01/02/2015  . Gait disorder 01/02/2015  . Other symptoms and signs involving the musculoskeletal system 01/02/2015  . Abnormal gait 01/02/2015  . Cervicalgia 12/18/2014  . Sciatica of left side 11/14/2014  . Neuralgia neuritis, sciatic nerve 11/14/2014  . Pseudoseizures ([place]) 09/12/2014  . Encounter for sterilization 09/09/2014  . Abdominal pain, acute, right lower quadrant 08/22/2014  . Headache, migraine 08/19/2014  . Seizure ([place]) 08/18/2014  . Encephalopathy 08/13/2014  . Headache 08/13/2014  . Altered mental status 08/13/2014  . Right leg numbness 07/31/2014  . Disturbance of skin sensation 07/31/2014  . Hypertension in pregnancy, transient 07/08/2014  . Previous gastric bypass affecting pregnancy, antepartum 05/22/2014  . History of bariatric surgery 05/22/2014  . 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 Antepartum mental disorder in pregnancy 05/09/2014  . Rapid palpitations 02/18/2014  . H/O maternal third degree perineal laceration, currently pregnant 02/18/2014  . High-risk pregnancy 02/18/2014  . Supervision of pregnancy with other poor reproductive or obstetric history, unspecified trimester 02/18/2014  . Restless legs 01/16/2014  . Restless leg 01/16/2014  . Chronic interstitial cystitis 10/23/2013  . Menorrhagia 07/18/2013  . Excessive and frequent menstruation 07/18/2013  . h/o Opiate addiction 03/11/2012    Class: Acute  . History of migraine headaches 03/10/2012    Class: Acute  . Depression with anxiety 03/10/2012     Class: Chronic  . 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 Attention deficit hyperactivity disorder, 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    Class: Chronic  . Panic disorder without agoraphobia 03/10/2012  . H/O disease 03/10/2012  . Dysthymia 03/10/2012  . 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 Other specified behavioral and emotional disorders with onset usually occurring in childhood and adolescence 03/10/2012  . Pelvic congestion syndrome 10/13/2011  . Coitalgia 10/13/2011  . Chronic migraine without aura 10/13/2011  . Unspecified dyspareunia 10/13/2011  . Other specified conditions associated with female genital organs and menstrual cycle 10/13/2011  . IBS (irritable bowel syndrome) 08/25/2011  . Diarrhea 05/27/2011  . OBESITY, UNSPECIFIED 09/17/2010  . Transaminitis 09/17/2010  . IDA (iron deficiency anemia) 07/23/2010    Past Surgical History:  Procedure Laterality Date  . BIOPSY  04/10/2015   Procedure: BIOPSY;  Surgeon: Daneil Dolin, MD;  Location: [facility];  Service: Endoscopy;;  . cath self every nite     for sodium bicarb injection (discontinued 2013)  .  CHOLECYSTECTOMY  2005   biliary dyskinesia  . COLONOSCOPY  JUN 2012 ABD PN/DIARRHEA WITH PROPOFOL   NL COLON  . COLONOSCOPY WITH PROPOFOL N/A 01/16/2021   Procedure: COLONOSCOPY WITH PROPOFOL;  Surgeon: Harvel Quale, MD;  Location: [facility];  Service: Gastroenterology;  Laterality: N/A;  1:45  . DILATION AND CURETTAGE OF UTERUS    . DILITATION & CURRETTAGE/HYSTROSCOPY WITH NOVASURE ABLATION N/A 03/24/2017   Procedure: DILATATION & CURETTAGE/HYSTEROSCOPY WITH NOVASURE ENDOMETRIAL ABLATION;  Surgeon: Jonnie Kind, MD;  Location: [facility];  Service: Gynecology;  Laterality: N/A;  . ESOPHAGEAL DILATION N/A 04/10/2015   Procedure: ESOPHAGEAL DILATION WITH 54FR MALONEY DILATOR;  Surgeon: Daneil Dolin, MD;  Location: [facility];   Service: Endoscopy;  Laterality: N/A;  . ESOPHAGOGASTRODUODENOSCOPY    . ESOPHAGOGASTRODUODENOSCOPY (EGD) WITH PROPOFOL N/A 04/10/2015   Procedure: ESOPHAGOGASTRODUODENOSCOPY (EGD) WITH PROPOFOL;  Surgeon: Daneil Dolin, MD;  Location: [facility];  Service: Endoscopy;  Laterality: N/A;  . ESOPHAGOGASTRODUODENOSCOPY (EGD) WITH PROPOFOL N/A 12/06/2016   Procedure: ESOPHAGOGASTRODUODENOSCOPY (EGD) WITH PROPOFOL;  Surgeon: Danie Binder, MD;  [facility];  Service: Endoscopy;  Laterality: N/A;  . ESOPHAGOGASTRODUODENOSCOPY (EGD) WITH PROPOFOL N/A 04/27/2019   Procedure: ESOPHAGOGASTRODUODENOSCOPY (EGD) WITH PROPOFOL;  Surgeon: Rogene Houston, MD;  [facility];  Service: Endoscopy;  Laterality: N/A;  730  . ESOPHAGOGASTRODUODENOSCOPY (EGD) WITH PROPOFOL N/A 08/31/2019   Procedure: ESOPHAGOGASTRODUODENOSCOPY (EGD) WITH PROPOFOL;  Surgeon: Rogene Houston, MD;  [facility];  Service: Endoscopy;  Laterality: N/A;  . ESOPHAGOGASTRODUODENOSCOPY (EGD) WITH PROPOFOL N/A 01/16/2021   Procedure: ESOPHAGOGASTRODUODENOSCOPY (EGD) WITH PROPOFOL;  Surgeon: Harvel Quale, MD;  [facility];  Service: Gastroenterology;  Laterality: N/A;  . GAB  2007   in [place]-POUCH 5 CM  . GASTRIC BYPASS  06/2006  . HYSTEROSCOPY WITH D & C N/A 09/12/2014   Procedure: DILATATION AND CURETTAGE /HYSTEROSCOPY;  Surgeon: Jonnie Kind, MD;  Location: [facility];  Service: Gynecology;  Laterality: N/A;  . KYPHOPLASTY N/A 08/02/2018   Procedure: KYPHOPLASTY T12;  Surgeon: Melina Schools, MD;  Location: [facility];  Service: Orthopedics;  Laterality: N/A;  60 mins  . LAPAROSCOPIC TUBAL LIGATION Bilateral 03/24/2017   Procedure: LAPAROSCOPIC TUBAL LIGATION ([place] Rings);  Surgeon: Jonnie Kind, MD;  Location: [facility];  Service: Gynecology;  Laterality: Bilateral;  . REPAIR VAGINAL CUFF N/A 07/30/2014   Procedure: REPAIR VAGINAL CUFF;  Surgeon: Mora Bellman, MD;  Location: [facility];   Service: Gynecology;  Laterality: N/A;  . SAVORY DILATION  06/20/2012   Dr. Barnie Alderman gastritis/Ulcer in the mid jejunum. Empiric dilation.   . SURAL NERVE BX Left 02/25/2016   Procedure: LEFT SURAL NERVE BIOPSY;  Surgeon: Jovita Gamma, MD;  Location: [facility];  Service: Neurosurgery;  Laterality: Left;  Left sural nerve biopsy  . TONSILLECTOMY    . TONSILLECTOMY AND ADENOIDECTOMY    . UPPER GASTROINTESTINAL ENDOSCOPY  JULY 2011 NAUSEA-D125,V6, PH 25   Bx; GASTRITIS, POUCH-5 CM LONG  . WISDOM TOOTH EXTRACTION       OB History    Gravida  3   Para  2   Term  1   Preterm  1   AB  1   Living  2     SAB  1   IAB      Ectopic      Multiple      Live Births  2  Family History  Problem Relation Age of Onset  . Hemochromatosis Maternal Grandmother   . Migraines Maternal Grandmother   . Cancer Maternal Grandmother   . Breast cancer Maternal Grandmother   . Hypertension Father   . Diabetes Father   . Coronary artery disease Father   . Migraines Paternal Grandmother   . Breast cancer Paternal Grandmother   . Cancer Mother        breast  . Hemochromatosis Mother   . Breast cancer Mother   . Depression Mother   . Anxiety disorder Mother   . Coronary artery disease Paternal Grandfather   . Anxiety disorder Brother   . Bipolar disorder Brother   . Healthy Daughter   . Healthy Son     Social History   Tobacco Use  . Smoking status: Former Smoker    Packs/day: 0.25    Years: 6.00    Pack years: 1.50    Types: Cigarettes    Start date: 05/2018  . Smokeless tobacco: Never Used  Vaping Use  . Vaping Use: Some days  Substance Use Topics  . Alcohol use: Not Currently    Alcohol/week: 0.0 standard drinks  . Drug use: Not Currently    Home Medications Prior to Admission medications   Medication Sig Start Date End Date Taking? Authorizing Provider  cetirizine (ZYRTEC) 10 MG tablet TAKE ONE TABLET BY MOUTH DAILY 03/17/21   Valentina Shaggy, MD  AIMOVIG 70 MG/ML SOAJ Inject 70 mg as directed every 30 (thirty) days. Patient not taking: Reported on 02/24/2021 07/26/20   [provider]  albuterol (VENTOLIN HFA) 108 (90 Base) MCG/ACT inhaler Inhale 2 puffs into the lungs every 6 (six) hours as needed for wheezing or shortness of breath. Patient not taking: Reported on 02/24/2021    [provider]  amphetamine-dextroamphetamine (ADDERALL XR) 30 MG 24 hr capsule Take 1 capsule (30 mg total) by mouth every morning. 03/12/21   Cloria Spring, MD  amphetamine-dextroamphetamine (ADDERALL XR) 30 MG 24 hr capsule Take 1 capsule (30 mg total) by mouth every morning. 03/12/21 03/12/22  Cloria Spring, MD  budesonide-formoterol [facility]) 160-4.5 MCG/ACT inhaler Inhale 2 puffs into the lungs 2 (two) times daily. Patient not taking: Reported on 02/24/2021 05/23/20   Valentina Shaggy, MD  Buprenorphine HCl (BELBUCA) 600 MCG FILM Place 600 mcg inside cheek 2 (two) times daily.    [provider]  busPIRone (BUSPAR) 10 MG tablet Take 1 tablet (10 mg total) by mouth 3 (three) times daily. 03/12/21   Cloria Spring, MD  Calcium Carb-Cholecalciferol (CALCIUM-VITAMIN D3) 600-500 MG-UNIT CAPS Take 1 tablet by mouth daily.    [provider]  Carboxymethylcellul-Glycerin (LUBRICATING EYE DROPS OP) Place 1 drop into both eyes daily as needed (dry eyes). Patient not taking: Reported on 02/24/2021    [provider]  clidinium-chlordiazePOXIDE (LIBRAX) 5-2.5 MG capsule Take 1 capsule by mouth 3 (three) times daily before meals. 03/17/21   Harvel Quale, MD  cyclobenzaprine (FLEXERIL) 10 MG tablet Take 1 tablet (10 mg total) by mouth 3 (three) times daily as needed for muscle spasms. 03/15/21   Konrad Felix, PA-C  DULoxetine (CYMBALTA) 60 MG capsule Take 1 capsule (60 mg total) by mouth 2 (two) times daily. 03/12/21   Cloria Spring, MD  fluconazole (DIFLUCAN) 150 MG tablet One po qd prn yeast infection;  may repeat in 3-4 days if needed 01/29/21   Kathyrn Drown, MD  HYDROmorphone (DILAUDID) 4 MG tablet  Take 4 mg by mouth every 4 (four) hours as needed for severe pain.    [provider]  lansoprazole (PREVACID) 30 MG capsule TAKE ONE CAPSULE BY MOUTH TWICE A DAY BEFORE A MEAL. 03/17/21   Harvel Quale, MD  levalbuterol [facility] HFA) 45 MCG/ACT inhaler INHALE (2) PUFFS INTO THE LUNGS EVERY SIX HOURS AS NEEDED FOR WHEEZING. 02/24/21   Valentina Shaggy, MD  levalbuterol [facility]) 1.25 MG/3ML nebulizer solution Take 1.25 mg by nebulization every 4 (four) hours as needed for wheezing. 02/24/21   Valentina Shaggy, MD  linaclotide [facility]) 290 MCG CAPS capsule Take 1 capsule (290 mcg total) by mouth daily before breakfast. 01/30/21   Montez Morita, Quillian Quince, MD  naloxegol oxalate (MOVANTIK) 12.5 MG TABS tablet Take 1 tablet (12.5 mg total) by mouth daily. 03/02/21   Harvel Quale, MD  ondansetron (ZOFRAN) 8 MG tablet Take 1 tablet (8 mg total) by mouth every 8 (eight) hours as needed for nausea. 01/07/21   Kathyrn Drown, MD  ondansetron (ZOFRAN-ODT) 8 MG disintegrating tablet Take 1 tablet (8 mg total) by mouth every 8 (eight) hours as needed. 12/04/20   Kathyrn Drown, MD  Pediatric Multivit-Minerals-C (FLINTSTONES GUMMIES) chewable tablet Chew 3 tablets by mouth daily.    [provider]  promethazine (PHENERGAN) 12.5 MG tablet TAKE 1 TABLET BY MOUTH EVERY 8 HOURS AS NEEDED FOR NAUSEA OR VOMITING 03/17/21   Montez Morita, Quillian Quince, MD  promethazine (PHENERGAN) 25 MG tablet Take 25 mg by mouth 2 (two) times daily as needed for nausea or vomiting.    [provider]  rOPINIRole (REQUIP) 1 MG tablet Take one tablet po each evening. Patient taking differently: Take 1 mg by mouth at bedtime. 10/27/20   Kathyrn Drown, MD  sucralfate (CARAFATE) 1 g tablet Take 1 tablet (1 g total) by mouth 4 (four) times daily -  with meals and at bedtime. 02/17/21  03/19/21  Kathyrn Drown, MD  traZODone (DESYREL) 100 MG tablet Take 2 tablets (200 mg total) by mouth at bedtime. 03/12/21   Cloria Spring, MD  valACYclovir (VALTREX) 1000 MG tablet TAKE 1 TABLET BY MOUTH DAILY. Patient taking differently: Take 1,000 mg by mouth daily as needed (fever blisters). 08/08/20   Estill Dooms, NP  Vitamin D, Ergocalciferol, (DRISDOL) 1.25 MG (50000 UNIT) CAPS capsule Take 50,000 Units by mouth every Wednesday.    [provider]    Allergies    Gabapentin, Metoclopramide hcl, Tramadol, Ativan [lorazepam], Latex, Lyrica [pregabalin], Iron, Sulfa antibiotics, Butrans [buprenorphine], Nucynta [tapentadol], Propofol, Tape, and Toradol [ketorolac tromethamine]  Review of Systems   Review of Systems  Constitutional: Negative for chills and fever.  Skin: Positive for color change and wound.    Physical Exam Updated Vital Signs BP 109/86 (BP Location: Left Arm)   Pulse (!) 105   Temp 98 F (36.7 C) (Oral)   Resp 14   Ht _0  (1.676 m)   Wt 68 kg   SpO2 95%   BMI 24.21 kg/m   Physical Exam Vitals and nursing note reviewed.  Constitutional:      General: She is not in acute distress.    Appearance: She is well-developed.  HENT:     Head: Normocephalic and atraumatic.  Eyes:     Conjunctiva/sclera: Conjunctivae normal.  Cardiovascular:     Rate and Rhythm: Normal rate and regular rhythm.     Heart sounds: No murmur heard.   Pulmonary:  Effort: Pulmonary effort is normal. No respiratory distress.     Breath sounds: Normal breath sounds.  Abdominal:     Palpations: Abdomen is soft.     Tenderness: There is no abdominal tenderness.  Musculoskeletal:     Cervical back: Neck supple.     Comments: 3 to 4 cm in diameter area of fluctuance to the right posterior upper scapula area with surrounding erythema.  No significant induration.  No visible drainage.  No streaking.  Skin:    General: Skin is warm and dry.     Findings: Erythema  present.  Neurological:     General: No focal deficit present.     Mental Status: She is alert and oriented to person, place, and time.  Psychiatric:        Mood and Affect: Mood normal.        Behavior: Behavior normal.     ED Results / Procedures / Treatments   Labs (all labs ordered are listed, but only abnormal results are displayed) Labs Reviewed - No data to display  EKG None  Radiology No results found.  Procedures .Marland KitchenIncision and Drainage  Date/Time: 03/22/2021 10:26 PM Performed by: Garald Balding, PA-C Authorized by: Garald Balding, PA-C   Consent:    Consent obtained:  Verbal   Consent given by:  Patient   Risks discussed:  Bleeding, incomplete drainage, pain and damage to other organs   Alternatives discussed:  No treatment Universal protocol:    Procedure explained and questions answered to patient or proxy's satisfaction: yes     Relevant documents present and verified: yes     Test results available : yes     Imaging studies available: yes     Required blood products, implants, devices, and special equipment available: yes     Site/side marked: yes     Immediately prior to procedure, a time out was called: yes     Patient identity confirmed:  Verbally with patient Location:    Type:  Abscess   Size:  3   Location:  Trunk   Trunk location:  Back Pre-procedure details:    Skin preparation:  Betadine and chlorhexidine with alcohol Anesthesia:    Anesthesia method:  Local infiltration   Local anesthetic:  Lidocaine 1% WITH epi Procedure type:    Complexity:  Simple Procedure details:    Ultrasound guidance: no     Incision types:  Single straight   Incision depth:  Subcutaneous   Wound management:  Probed and deloculated, irrigated with saline and extensive cleaning   Drainage:  Purulent   Drainage amount:  Moderate   Wound treatment:  Wound left open   Packing materials:  None Post-procedure details:    Procedure completion:  Tolerated well,  no immediate complications     Medications Ordered in ED Medications  acetaminophen (TYLENOL) tablet 650 mg (650 mg Oral Not Given 03/22/21 2144)  Tdap (BOOSTRIX) injection 0.5 mL (0.5 mLs Intramuscular Given 03/22/21 2140)  lidocaine-EPINEPHrine (XYLOCAINE W/EPI) 1 %-1:100000 (with pres) injection 20 mL (20 mLs Intradermal Given by Other 03/22/21 2218)    ED Course  I have reviewed the triage vital signs and the nursing notes.  Pertinent labs & imaging results that were available during my care of the patient were reviewed by me and considered in my medical decision making (see chart for details).    MDM Rules/Calculators/A&P  Patient with skin abscess amenable to incision and drainage.  Abscess was not large enough to warrant packing or drain,  wound recheck in 2 days. Encouraged home warm soaks and flushing.  Mild signs of cellulitis is surrounding skin.  Will d/c to home.  No antibiotic therapy is indicated.  Final Clinical Impression(s) / ED Diagnoses Final diagnoses:  Abscess    Rx / DC Orders ED Discharge Orders    None       Lyndel Safe 03/22/21 2228    [place] Ferguson, MD 03/24/21 [phone]

## 2021-03-22 NOTE — Discharge Instructions (Signed)
Please  your home medicines for pain. Keep wound clean with warm soap and water and keep bandage dry, do not submerge in water for 24 hours. Change bandage sooner if it gets dirty Return for fever, increased redness, swelling, pain, or worsening drainage

## 2021-03-22 NOTE — ED Triage Notes (Signed)
Pt with abscess to right shoulder for a month but last 2 days has been more painful.

## 2021-03-22 NOTE — ED Notes (Signed)
Pt stated she cannot take tylenol, pt stated she can take aleve. This nurse offered ibuprofen per order, pt refused and said she cannot take either one. PA made aware

## 2021-03-23 ENCOUNTER — Encounter: Payer: Self-pay | Admitting: Family Medicine

## 2021-03-23 ENCOUNTER — Other Ambulatory Visit (INDEPENDENT_AMBULATORY_CARE_PROVIDER_SITE_OTHER): Payer: Self-pay

## 2021-03-23 ENCOUNTER — Encounter (INDEPENDENT_AMBULATORY_CARE_PROVIDER_SITE_OTHER): Payer: Self-pay

## 2021-03-23 ENCOUNTER — Telehealth: Payer: Self-pay | Admitting: *Deleted

## 2021-03-23 DIAGNOSIS — D509 Iron deficiency anemia, unspecified: Secondary | ICD-10-CM

## 2021-03-23 NOTE — Telephone Encounter (Signed)
Transition Care Management Follow-up Telephone Call  Date of discharge and from where: 03/22/2021 -  [name]  How have you been since you were released from the hospital? "Better"  Any questions or concerns? No  Items Reviewed:  Did the pt receive and understand the discharge instructions provided? Yes   Medications obtained and verified? Yes   Other? No   Any new allergies since your discharge? No   Dietary orders reviewed? No  Do you have support at home? Yes    Functional Questionnaire: (I = Independent and D = Dependent) ADLs: I  Bathing/Dressing- I  Meal Prep- I  Eating- I  Maintaining continence- I  Transferring/Ambulation- I  Managing Meds- I  Follow up appointments reviewed:   PCP Hospital f/u appt confirmed? No    Specialist Hospital f/u appt confirmed? No    Are transportation arrangements needed? No   If their condition worsens, is the pt aware to call PCP or go to the Emergency Dept.? Yes  Was the patient provided with contact information for the PCP's office or ED? Yes  Was to pt encouraged to call back with questions or concerns? Yes

## 2021-03-24 DIAGNOSIS — M546 Pain in thoracic spine: Secondary | ICD-10-CM | POA: Diagnosis not present

## 2021-03-24 DIAGNOSIS — D509 Iron deficiency anemia, unspecified: Secondary | ICD-10-CM | POA: Diagnosis not present

## 2021-03-24 DIAGNOSIS — Z79899 Other long term (current) drug therapy: Secondary | ICD-10-CM | POA: Diagnosis not present

## 2021-03-25 ENCOUNTER — Telehealth: Payer: Self-pay

## 2021-03-25 ENCOUNTER — Encounter (INDEPENDENT_AMBULATORY_CARE_PROVIDER_SITE_OTHER): Payer: Self-pay

## 2021-03-25 LAB — CBC WITH DIFFERENTIAL/PLATELET
Basophils Absolute: 0 10*3/uL (ref 0.0–0.2)
Basos: 1 %
EOS (ABSOLUTE): 0.1 10*3/uL (ref 0.0–0.4)
Eos: 2 %
Hematocrit: 33.5 % — ABNORMAL LOW (ref 34.0–46.6)
Hemoglobin: 11 g/dL — ABNORMAL LOW (ref 11.1–15.9)
Immature Grans (Abs): 0 10*3/uL (ref 0.0–0.1)
Immature Granulocytes: 0 %
Lymphocytes Absolute: 2.2 10*3/uL (ref 0.7–3.1)
Lymphs: 37 %
MCH: 28.8 pg (ref 26.6–33.0)
MCHC: 32.8 g/dL (ref 31.5–35.7)
MCV: 88 fL (ref 79–97)
Monocytes Absolute: 0.4 10*3/uL (ref 0.1–0.9)
Monocytes: 6 %
Neutrophils Absolute: 3.4 10*3/uL (ref 1.4–7.0)
Neutrophils: 54 %
Platelets: 287 10*3/uL (ref 150–450)
RBC: 3.82 x10E6/uL (ref 3.77–5.28)
RDW: 14.4 % (ref 11.7–15.4)
WBC: 6.1 10*3/uL (ref 3.4–10.8)

## 2021-03-25 NOTE — Telephone Encounter (Signed)
Pt contacted. Pt states she is having tingling and numbness (happened in 2016). Kneecap to foot on left side- very difficult to walk. "Can touch it and feel it."Seems like drop foot to patient. Pushing down stays white for about 3 seconds.  Unable to urinate. One time had dribble today. Other than that no urine. Last full urine was yesterday. Pt states she is drinking enough. Please advise. Thank you

## 2021-03-25 NOTE — Telephone Encounter (Signed)
Pt contacted and verbalized understanding. Pt transferred up front to be placed on schedule

## 2021-03-25 NOTE — Telephone Encounter (Signed)
Pt woke up on left side from knee cap to foot it is tingling and difficult to walk on she has not been able to pee other than dribble she has the urge to pee but she can not pee.   Pt call back [phone]

## 2021-03-25 NOTE — Telephone Encounter (Signed)
If she feels she has bladder obstruction then urgent care or er No available ov for thur or Friday Therefore immediate care would need to be via urgent care or ED If the leg is blue or cold ER eval If severe weakness ER Otherwise first avaiable ov would be early next week with myself or Santiago Glad

## 2021-03-26 ENCOUNTER — Ambulatory Visit (INDEPENDENT_AMBULATORY_CARE_PROVIDER_SITE_OTHER): Payer: Medicaid Other | Admitting: Gastroenterology

## 2021-03-26 ENCOUNTER — Encounter (HOSPITAL_COMMUNITY)
Admission: RE | Admit: 2021-03-26 | Discharge: 2021-03-26 | Disposition: A | Discharge status: Home / Self Care | Payer: Medicaid Other | Source: Ambulatory Visit | Attending: Gastroenterology | Admitting: Gastroenterology

## 2021-03-26 ENCOUNTER — Other Ambulatory Visit: Payer: Self-pay

## 2021-03-26 ENCOUNTER — Encounter (INDEPENDENT_AMBULATORY_CARE_PROVIDER_SITE_OTHER): Payer: Self-pay | Admitting: Gastroenterology

## 2021-03-26 VITALS — BP 125/87 | HR 121 | Temp 97.6°F | Ht 66.0 in | Wt 157.0 lb

## 2021-03-26 DIAGNOSIS — R109 Unspecified abdominal pain: Secondary | ICD-10-CM

## 2021-03-26 DIAGNOSIS — K582 Mixed irritable bowel syndrome: Secondary | ICD-10-CM | POA: Diagnosis not present

## 2021-03-26 DIAGNOSIS — K5903 Drug induced constipation: Secondary | ICD-10-CM

## 2021-03-26 DIAGNOSIS — K5900 Constipation, unspecified: Secondary | ICD-10-CM | POA: Diagnosis not present

## 2021-03-26 DIAGNOSIS — Z79891 Long term (current) use of opiate analgesic: Secondary | ICD-10-CM

## 2021-03-26 DIAGNOSIS — R112 Nausea with vomiting, unspecified: Secondary | ICD-10-CM | POA: Diagnosis not present

## 2021-03-26 DIAGNOSIS — D500 Iron deficiency anemia secondary to blood loss (chronic): Secondary | ICD-10-CM | POA: Diagnosis not present

## 2021-03-26 DIAGNOSIS — K289 Gastrojejunal ulcer, unspecified as acute or chronic, without hemorrhage or perforation: Secondary | ICD-10-CM | POA: Diagnosis not present

## 2021-03-26 DIAGNOSIS — G8929 Other chronic pain: Secondary | ICD-10-CM

## 2021-03-26 MED ORDER — SUCRALFATE 1 G PO TABS: 1.0000 g | ORAL_TABLET | Freq: Three times a day (TID) | ORAL | 3 refills | Status: DC

## 2021-03-26 MED ORDER — LINACLOTIDE 290 MCG PO CAPS
290.0000 ug | ORAL_CAPSULE | Freq: Every day | ORAL | 3 refills | Status: DC
Start: 2021-03-26 — End: 2022-03-31

## 2021-03-26 MED ORDER — SODIUM CHLORIDE 0.9 % IV SOLN
510.0000 mg | INTRAVENOUS | Status: DC
  Filled 2021-03-26: qty 17

## 2021-03-26 MED ORDER — LANSOPRAZOLE 30 MG PO CPDR: 30.0000 mg | DELAYED_RELEASE_CAPSULE | Freq: Two times a day (BID) | ORAL | 1 refills | Status: DC

## 2021-03-26 MED ORDER — NALOXEGOL OXALATE 12.5 MG PO TABS: 25.0000 mg | ORAL_TABLET | Freq: Every day | ORAL | 3 refills | Status: DC

## 2021-03-26 NOTE — Progress Notes (Signed)
Penny [name], M.D. Gastroenterology & Hepatology [facility] [address] [place], [place] [address]  Primary Care Physician: Kathyrn Drown, MD [place] [address]  I will communicate my assessment and recommendations to the referring MD via EMR.  Problems: 1. Chronic upper quadrant abdominal pain, functional 2. Gastrojejunal anastomotic ulcer  3. 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 Chronic opiate use  History of Present Illness: Penny [name] is a 35 y.o. 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  The patient was last seen on 12/25/2020.   Patient has undergone an extensive work-up during the last months since the last time she was seen in clinic.  This included cross-sectional abdominal imaging With an abdominal ultrasound of the right upper quadrant on 01/01/2021 that showed fatty liver.  She was ordered to have a CT of the abdomen and pelvis with IV contrast but did not perform the study.  Underwent a esophagogastroduodenospy on 01/16/2021 which showed presence of an anastomotic ulcer.  She was prescribed Prevacid 30 mg twice a day and Carafate.  Also advised to avoid smoking.  Colonoscopy recently showed a poor prep, she had hemorrhoids, there were no large masses or large polyps that could be observed throughout the inspection of the colon.  The patient was referred back to a bariatric surgeon as she had presence of anastomotic ulcer.  Saw a bariatric surgeon in [facility], who ordered an upper GI series to be performed on 04/14/2021.She is still taking carafate, she feels it helps with her symptoms. Has not been takin  lanzoprazole for a week as she ran out of the medication.  She is still smoking but has tried to cut down as much as possible.  The patient has been prescribed 3 different medications since she was seen in the clinic.  She is currently on Linzess to 90 mcg every day, Movantik 12.5 mg every day and Librax daily. Patient is taking Librax TID which she reports helps with the nausea and abdominal pain. She also reports it has helped decreasing the regurgitation episodes. She is able to keep some food down, better than before.States the abdominal pain is still present in the RUQ, better than before - now is 6/10.She is having some intermittent episodes of blood in the sool and sometimes the BMs are hard. Every 2 days she has a BM which is much better than prior but she would like to have more frequent bowel movements and have to strain less.  She has presenting some urinary retention since yesterday., but has not been started on new medications for the last few days.  Patient was found to have iron deficiency with very mild anemia of 11.0.  She was scheduled to start IV iron infusion but she had to reschedule her IV iron infusion as she arrived late to the appointment.  The patient denies having any  fever, chills, melena, hematemesis,  diarrhea, jaundice, pruritus or weight loss.  Last EGD: as above Last Colonoscopy: as above  Past Medical History: Past Medical History:  Diagnosis Date  . ADD (attention deficit disorder)   . Anemia 2011   2o to GASTRIC BYPASS  . Anginal pain ([place])   . Anxiety   . Asthma   .  Blood transfusion without reported diagnosis   . Chronic daily headache   . Depression   . Depression   . Dysmenorrhea 09/18/2013  . Dysrhythmia   . Elevated liver enzymes JUL 2011ALK PHOS 111-127 AST  143-267 ALT  213-321T BILI 0.6  ALB  3.7-4.06 Jun 2011 ALK PHOS 118 AST 24 ALT 42 T BILI 0.4 ALB 3.9  . Encounter for drug rehabilitation    at behavioral health for opioid addiction  about 4 years ago  . Family history of adverse reaction to anesthesia    'dad had to be kept on pump for breathing for morphine'  . Fatty liver   . Fibroid 01/18/2017  . Fibromyalgia   . Gastric bypass status for obesity   . Gastritis JULY 2011  . Heart murmur   . 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 History of Holter monitoring   . Hx of opioid abuse ([place])    for about 4 years, about 4 years ago  . Interstitial cystitis   . Iron deficiency anemia 07/23/2010  . Irritable bowel syndrome 2012 DIARRHEA   JUN 2012 TTG IgA 14.9  . IUD FEB 2010  . Lupus ([place])   . Menorrhagia 07/18/2013  . Migraines   . Obesity (BMI 30-39.9) 2011 228 LBS BMI 36.8  . Osteoporosis   . Ovarian cyst   . Patient desires pregnancy 09/18/2013  . Polyneuropathy   . PONV (postoperative nausea and vomiting) seizure post-operatively   seizure following ankle surgery  . Potassium (K) deficiency   . Pregnant 12/25/2013  . Psychiatric pseudoseizure   . RLQ abdominal pain 07/18/2013  . Sciatica of left side 11/14/2014  . 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 Stress 09/18/2013    Past Surgical History: Past Surgical History:  Procedure Laterality Date  . BIOPSY  04/10/2015   Procedure: BIOPSY;  Surgeon: Daneil Dolin, MD;  Location: [facility];  Service: Endoscopy;;  . cath self every nite     for sodium bicarb injection (discontinued 2013)  . CHOLECYSTECTOMY  2005   biliary dyskinesia  . COLONOSCOPY  JUN 2012 ABD PN/DIARRHEA WITH PROPOFOL   NL COLON  . COLONOSCOPY WITH PROPOFOL N/A 01/16/2021   Procedure: COLONOSCOPY WITH PROPOFOL;  Surgeon: Harvel Quale, MD;  [facility];  Service: Gastroenterology;  Laterality: N/A;  1:45  . DILATION AND CURETTAGE OF UTERUS    . DILITATION & CURRETTAGE/HYSTROSCOPY WITH NOVASURE ABLATION N/A 03/24/2017   Procedure: DILATATION & CURETTAGE/HYSTEROSCOPY WITH NOVASURE ENDOMETRIAL ABLATION;  Surgeon: Jonnie Kind, MD;   Location: [facility];  Service: Gynecology;  Laterality: N/A;  . ESOPHAGEAL DILATION N/A 04/10/2015   Procedure: ESOPHAGEAL DILATION WITH 54FR MALONEY DILATOR;  Surgeon: Daneil Dolin, MD;  Location: [facility];  Service: Endoscopy;  Laterality: N/A;  . ESOPHAGOGASTRODUODENOSCOPY    . ESOPHAGOGASTRODUODENOSCOPY (EGD) WITH PROPOFOL N/A 04/10/2015   Procedure: ESOPHAGOGASTRODUODENOSCOPY (EGD) WITH PROPOFOL;  Surgeon: Daneil Dolin, MD;  Location: [facility];  Service: Endoscopy;  Laterality: N/A;  . ESOPHAGOGASTRODUODENOSCOPY (EGD) WITH PROPOFOL N/A 12/06/2016   Procedure: ESOPHAGOGASTRODUODENOSCOPY (EGD) WITH PROPOFOL;  Surgeon: Danie Binder, MD;  [facility];  Service: Endoscopy;  Laterality: N/A;  . ESOPHAGOGASTRODUODENOSCOPY (EGD) WITH PROPOFOL N/A 04/27/2019   Procedure: ESOPHAGOGASTRODUODENOSCOPY (EGD) WITH PROPOFOL;  Surgeon: Rogene Houston, MD;  [facility];  Service: Endoscopy;  Laterality: N/A;  730  . ESOPHAGOGASTRODUODENOSCOPY (EGD) WITH PROPOFOL N/A 08/31/2019   Procedure: ESOPHAGOGASTRODUODENOSCOPY (EGD) WITH PROPOFOL;  Surgeon: Rogene Houston, MD;  [facility];  Service: Endoscopy;  Laterality: N/A;  . ESOPHAGOGASTRODUODENOSCOPY (EGD) WITH PROPOFOL N/A 01/16/2021   Procedure: ESOPHAGOGASTRODUODENOSCOPY (EGD) WITH PROPOFOL;  Surgeon: Harvel Quale, MD;  [facility];  Service: Gastroenterology;  Laterality: N/A;  . GAB  2007   in [place]-POUCH 5 CM  . GASTRIC BYPASS  06/2006  . HYSTEROSCOPY WITH D & C N/A 09/12/2014   Procedure: DILATATION AND CURETTAGE /HYSTEROSCOPY;  Surgeon: Jonnie Kind, MD;  [facility]: [facility];  Service: Gynecology;  Laterality: N/A;  . KYPHOPLASTY N/A 08/02/2018   Procedure: KYPHOPLASTY T12;  Surgeon: Melina Schools, MD;  Location: [facility];  Service: Orthopedics;  Laterality: N/A;  60 mins  . LAPAROSCOPIC TUBAL LIGATION Bilateral 03/24/2017   Procedure: LAPAROSCOPIC TUBAL LIGATION ([place] Rings);  Surgeon: Jonnie Kind, MD;  Location: [facility];  Service: Gynecology;  Laterality: Bilateral;  . REPAIR VAGINAL CUFF N/A 07/30/2014   Procedure: REPAIR VAGINAL CUFF;  Surgeon: Mora Bellman, MD;  Location: [facility];  Service: Gynecology;  Laterality: N/A;  . SAVORY DILATION  06/20/2012   Dr. Barnie Alderman gastritis/Ulcer in the mid jejunum. Empiric dilation.   . SURAL NERVE BX Left 02/25/2016   Procedure: LEFT SURAL NERVE BIOPSY;  Surgeon: Jovita Gamma, MD;  Location: [facility];  Service: Neurosurgery;  Laterality: Left;  Left sural nerve biopsy  . TONSILLECTOMY    . TONSILLECTOMY AND ADENOIDECTOMY    . UPPER GASTROINTESTINAL ENDOSCOPY  JULY 2011 NAUSEA-D125,V6, PH 25   Bx; GASTRITIS, POUCH-5 CM LONG  . WISDOM TOOTH EXTRACTION      Family History: Family History  Problem Relation Age of Onset  . Hemochromatosis Maternal Grandmother   . Migraines Maternal Grandmother   . Cancer Maternal Grandmother   . Breast cancer Maternal Grandmother   . Hypertension Father   . Diabetes Father   . Coronary artery disease Father   . Migraines Paternal Grandmother   . Breast cancer Paternal Grandmother   . Cancer Mother        breast  . Hemochromatosis Mother   . Breast cancer Mother   . Depression Mother   . Anxiety disorder Mother   . Coronary artery disease Paternal Grandfather   . Anxiety disorder Brother   . Bipolar disorder Brother   . Healthy Daughter   . Healthy Son     Social History: Social History   Tobacco Use  Smoking Status Former Smoker  . Packs/day: 0.25  . Years: 6.00  . Pack years: 1.50  . Types: Cigarettes  . Start date: 05/2018  Smokeless Tobacco Never Used   Social History   Substance and Sexual Activity  Alcohol Use Not Currently  . Alcohol/week: 0.0 standard drinks   Social History   Substance and Sexual Activity  Drug Use Not Currently    Allergies: Allergies  Allergen Reactions  . Gabapentin Other (See Comments)    Pt states that she was unresponsive  after taking this medication, but her vitals remained stable.    . Metoclopramide Hcl Anxiety and Other (See Comments)    Pt states that she felt like she was trapped in a box, and could not get out.  Pt also states that she had temporary loss of movement, weakness, and tingling.    . Tramadol Other (See Comments)    Seizures  . Ativan [Lorazepam] Other (See Comments)    combative  . Latex Itching, Rash and Other (See Comments)  Burns  . Lyrica [Pregabalin] Other (See Comments)    suicidal  . Iron Other (See Comments)    PATIENT IS INTOLERANT TO ORAL IRON , SHE DOES NOT ABSORB IT.    Marland Kitchen Sulfa Antibiotics Hives  . Butrans [Buprenorphine] Rash    Patch caused rash, didn't help with pain  . Nucynta [Tapentadol] Nausea And Vomiting  . Propofol Nausea And Vomiting  . Tape Hives, Itching and Rash    Please use paper tape  . Toradol [Ketorolac Tromethamine] Anxiety    Medications: Current Outpatient Medications  Medication Sig Dispense Refill  . cetirizine (ZYRTEC) 10 MG tablet TAKE ONE TABLET BY MOUTH DAILY 30 tablet 5  . AIMOVIG 70 MG/ML SOAJ Inject 70 mg as directed every 30 (thirty) days. (Patient not taking: Reported on 02/24/2021)    . albuterol (VENTOLIN HFA) 108 (90 Base) MCG/ACT inhaler Inhale 2 puffs into the lungs every 6 (six) hours as needed for wheezing or shortness of breath. (Patient not taking: Reported on 02/24/2021)    . amphetamine-dextroamphetamine (ADDERALL XR) 30 MG 24 hr capsule Take 1 capsule (30 mg total) by mouth every morning. 30 capsule 0  . amphetamine-dextroamphetamine (ADDERALL XR) 30 MG 24 hr capsule Take 1 capsule (30 mg total) by mouth every morning. 30 capsule 0  . budesonide-formoterol (SYMBICORT) 160-4.5 MCG/ACT inhaler Inhale 2 puffs into the lungs 2 (two) times daily. (Patient not taking: Reported on 02/24/2021) 1 Inhaler 5  . Buprenorphine HCl (BELBUCA) 600 MCG FILM Place 600 mcg inside cheek 2 (two) times daily.    . busPIRone (BUSPAR) 10 MG tablet  Take 1 tablet (10 mg total) by mouth 3 (three) times daily. 90 tablet 2  . Calcium Carb-Cholecalciferol (CALCIUM-VITAMIN D3) 600-500 MG-UNIT CAPS Take 1 tablet by mouth daily.    . Carboxymethylcellul-Glycerin (LUBRICATING EYE DROPS OP) Place 1 drop into both eyes daily as needed (dry eyes). (Patient not taking: Reported on 02/24/2021)    . clidinium-chlordiazePOXIDE (LIBRAX) 5-2.5 MG capsule Take 1 capsule by mouth 3 (three) times daily before meals. 90 capsule 5  . cyclobenzaprine (FLEXERIL) 10 MG tablet Take 1 tablet (10 mg total) by mouth 3 (three) times daily as needed for muscle spasms. 30 tablet 0  . DULoxetine (CYMBALTA) 60 MG capsule Take 1 capsule (60 mg total) by mouth 2 (two) times daily. 180 capsule 2  . fluconazole (DIFLUCAN) 150 MG tablet One po qd prn yeast infection; may repeat in 3-4 days if needed 2 tablet 0  . HYDROmorphone (DILAUDID) 4 MG tablet Take 4 mg by mouth every 4 (four) hours as needed for severe pain.    Marland Kitchen lansoprazole (PREVACID) 30 MG capsule TAKE ONE CAPSULE BY MOUTH TWICE A DAY BEFORE A MEAL. 180 capsule 0  . levalbuterol (XOPENEX HFA) 45 MCG/ACT inhaler INHALE (2) PUFFS INTO THE LUNGS EVERY SIX HOURS AS NEEDED FOR WHEEZING. 15 g 3  . levalbuterol (XOPENEX) 1.25 MG/3ML nebulizer solution Take 1.25 mg by nebulization every 4 (four) hours as needed for wheezing. 50 mL 3  . linaclotide (LINZESS) 290 MCG CAPS capsule Take 1 capsule (290 mcg total) by mouth daily before breakfast. 90 capsule 3  . naloxegol oxalate (MOVANTIK) 12.5 MG TABS tablet Take 1 tablet (12.5 mg total) by mouth daily. 90 tablet 3  . ondansetron (ZOFRAN) 8 MG tablet Take 1 tablet (8 mg total) by mouth every 8 (eight) hours as needed for nausea. 12 tablet 3  . ondansetron (ZOFRAN-ODT) 8 MG disintegrating tablet Take 1 tablet (8 mg total)  by mouth every 8 (eight) hours as needed. 50 tablet 3  . Pediatric Multivit-Minerals-C (FLINTSTONES GUMMIES) chewable tablet Chew 3 tablets by mouth daily.    .  promethazine (PHENERGAN) 12.5 MG tablet TAKE 1 TABLET BY MOUTH EVERY 8 HOURS AS NEEDED FOR NAUSEA OR VOMITING 30 tablet 0  . promethazine (PHENERGAN) 25 MG tablet Take 25 mg by mouth 2 (two) times daily as needed for nausea or vomiting.    Marland Kitchen rOPINIRole (REQUIP) 1 MG tablet Take one tablet po each evening. (Patient taking differently: Take 1 mg by mouth at bedtime.) 30 tablet 5  . sucralfate (CARAFATE) 1 g tablet Take 1 tablet (1 g total) by mouth 4 (four) times daily -  with meals and at bedtime. 90 tablet 0  . traZODone (DESYREL) 100 MG tablet Take 2 tablets (200 mg total) by mouth at bedtime. 60 tablet 2  . valACYclovir (VALTREX) 1000 MG tablet TAKE 1 TABLET BY MOUTH DAILY. (Patient taking differently: Take 1,000 mg by mouth daily as needed (fever blisters).) 90 tablet PRN  . Vitamin D, Ergocalciferol, (DRISDOL) 1.25 MG (50000 UNIT) CAPS capsule Take 50,000 Units by mouth every Wednesday.     No current facility-administered medications for this visit.   Facility-Administered Medications Ordered in Other Visits  Medication Dose Route Frequency Provider Last Rate Last Admin  . ferumoxytol (FERAHEME) 510 mg in sodium chloride 0.9 % 100 mL IVPB  510 mg Intravenous Weekly Montez Morita, Zaylin Pistilli, MD        Review of Systems: GENERAL: negative for malaise, night sweats HEENT: No changes in hearing or vision, no nose bleeds or other nasal problems. NECK: Negative for lumps, goiter, pain and significant neck swelling RESPIRATORY: Negative for cough, wheezing CARDIOVASCULAR: Negative for chest pain, leg swelling, palpitations, orthopnea GI: SEE HPI MUSCULOSKELETAL: Negative for joint pain or swelling, back pain, and muscle pain. SKIN: Negative for lesions, rash PSYCH: Negative for sleep disturbance, mood disorder and recent psychosocial stressors. HEMATOLOGY Negative for prolonged bleeding, bruising easily, and swollen nodes. ENDOCRINE: Negative for cold or heat intolerance, polyuria,  polydipsia and goiter. NEURO: negative for tremor, gait imbalance, syncope and seizures. The remainder of the review of systems is noncontributory.   Physical Exam: BP 125/87 (BP Location: Left Arm, Patient Position: Sitting, Cuff Size: Small)   Pulse (!) 121   Temp 97.6 F (36.4 C) (Oral)   Ht $R'5\' 6"'Rm$  (1.676 m)   Wt 157 lb (71.2 kg)   BMI 25.34 kg/m  GENERAL: The patient is AO x3, in no acute distress. HEENT: Head is normocephalic and atraumatic. EOMI are intact. Mouth is well hydrated and without lesions. NECK: Supple. No masses LUNGS: Clear to auscultation. No presence of rhonchi/wheezing/rales. Adequate chest expansion HEART: RRR, normal s1 and s2. ABDOMEN: tender in upper abdomen but worse in RUQ, no guarding, no peritoneal signs, and nondistended. BS +. No masses. EXTREMITIES: Without any cyanosis, clubbing, rash, lesions or edema. NEUROLOGIC: AOx3, no focal motor deficit. SKIN: no jaundice, no rashes  Imaging/Labs: as above  I personally reviewed and interpreted the available labs, imaging and endoscopic files.  Impression and Plan: Penny [name] is a 35 y.o. 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  The patient has had extensive investigations that have showed only presence of a gastrojejunal anastomotic ulcer.  For this, she is  currently on PPI and Carafate which she should continue taking, I will refill the PPI.  She is supposed to have a repeat EGD next month to assess to assess wound healing.  I suspect she will still have presence of an ulcer at that time as she is still smoking and ran out of her medication 1 week ago.  I emphasized the importance of quitting smoking to improve the ulcer size.  She will also follow with her  bariatric surgeon to determine if she needs an eventual revision.  Also, regarding her abdominal pain, she likely is presenting functional abdominal pain due to bowel hypersensitivity exacerbated by her chronic opiate intake.  Overall, this has improved in severity as well as her nausea and vomiting with the use of Librax which she should continue indefinitely.  Her bowel movement frequency has also improved.  This is related to opiate induced constipation.  I will increase her Movantik 25 mg every day and she will need to continue taking the Linzess 290 mcg every day.  Finally, in terms of her iron deficiency anemia, this is likely related to her gastrojejunal ulcer.  She should proceed with her scheduled IV iron infusion but there is no need for any blood transfusion at this time.  - Continue Librax 3 times a day before meals - Continue Carafate as needed for abdominal pain - Increase Movantik to 25 mg every day - Continue Linzess 290 mcg every day - Continue Prevacid 30 mg twice a day, please open capsules and swallow granules - Proceed with scheduled EGD - Proceed with scheduled IV iron infusion - Smoking cessation  All questions were answered.      Harvel Quale, MD Gastroenterology and Hepatology [facility]

## 2021-03-26 NOTE — Patient Instructions (Addendum)
Continue Librax 3 times a day before meals Continue Carafate as needed for abdominal pain Increase Movantik to 25 mg every day Continue Linzess 290 mcg every day Continue Prevacid 30 mg twice a day, please open capsules and swallow granules Proceed with scheduled EGD Proceed with scheduled IV iron infusion Smoking cessation

## 2021-03-26 NOTE — H&P (View-Only) (Signed)
Penny [name], M.D. Gastroenterology & Hepatology [facility] [address] [place], [place] [address]  Primary Care Physician: Kathyrn Drown, MD [place] [id number]  I will communicate my assessment and recommendations to the referring MD via EMR.  Problems: 1. Chronic upper quadrant abdominal pain, functional 2. Gastrojejunal anastomotic ulcer  3. 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 Chronic opiate use  History of Present Illness: Penny [name] is a 35 y.o. 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  The patient was last seen on 12/25/2020.   Patient has undergone an extensive work-up during the last months since the last time she was seen in clinic.  This included cross-sectional abdominal imaging With an abdominal ultrasound of the right upper quadrant on 01/01/2021 that showed fatty liver.  She was ordered to have a CT of the abdomen and pelvis with IV contrast but did not perform the study.  Underwent a esophagogastroduodenospy on 01/16/2021 which showed presence of an anastomotic ulcer.  She was prescribed Prevacid 30 mg twice a day and Carafate.  Also advised to avoid smoking.  Colonoscopy recently showed a poor prep, she had hemorrhoids, there were no large masses or large polyps that could be observed throughout the inspection of the colon.  The patient was referred back to a bariatric surgeon as she had presence of anastomotic ulcer.  Saw a bariatric surgeon in [facility], who ordered an upper GI series to be performed on 04/14/2021.She is still taking carafate, she feels it helps with her symptoms. Has not been takin  lanzoprazole for a week as she ran out of the medication.  She is still smoking but has tried to cut down as much as possible.  The patient has been prescribed 3 different medications since she was seen in the clinic.  She is currently on Linzess to 90 mcg every day, Movantik 12.5 mg every day and Librax daily. Patient is taking Librax TID which she reports helps with the nausea and abdominal pain. She also reports it has helped decreasing the regurgitation episodes. She is able to keep some food down, better than before.States the abdominal pain is still present in the RUQ, better than before - now is 6/10.She is having some intermittent episodes of blood in the sool and sometimes the BMs are hard. Every 2 days she has a BM which is much better than prior but she would like to have more frequent bowel movements and have to strain less.  She has presenting some urinary retention since yesterday., but has not been started on new medications for the last few days.  Patient was found to have iron deficiency with very mild anemia of 11.0.  She was scheduled to start IV iron infusion but she had to reschedule her IV iron infusion as she arrived late to the appointment.  The patient denies having any  fever, chills, melena, hematemesis,  diarrhea, jaundice, pruritus or weight loss.  Last EGD: as above Last Colonoscopy: as above  Past Medical History: Past Medical History:  Diagnosis Date  . ADD (attention deficit disorder)   . Anemia 2011   2o to GASTRIC BYPASS  . Anginal pain ([place])   . Anxiety   . Asthma   .  Blood transfusion without reported diagnosis   . Chronic daily headache   . Depression   . Depression   . Dysmenorrhea 09/18/2013  . Dysrhythmia   . Elevated liver enzymes JUL 2011ALK PHOS 111-127 AST  143-267 ALT  213-321T BILI 0.6  ALB  3.7-4.06 Jun 2011 ALK PHOS 118 AST 24 ALT 42 T BILI 0.4 ALB 3.9  . Encounter for drug rehabilitation    at behavioral health for opioid addiction  about 4 years ago  . Family history of adverse reaction to anesthesia    'dad had to be kept on pump for breathing for morphine'  . Fatty liver   . Fibroid 01/18/2017  . Fibromyalgia   . Gastric bypass status for obesity   . Gastritis JULY 2011  . Heart murmur   . 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 History of Holter monitoring   . Hx of opioid abuse ([place])    for about 4 years, about 4 years ago  . Interstitial cystitis   . Iron deficiency anemia 07/23/2010  . Irritable bowel syndrome 2012 DIARRHEA   JUN 2012 TTG IgA 14.9  . IUD FEB 2010  . Lupus ([place])   . Menorrhagia 07/18/2013  . Migraines   . Obesity (BMI 30-39.9) 2011 228 LBS BMI 36.8  . Osteoporosis   . Ovarian cyst   . Patient desires pregnancy 09/18/2013  . Polyneuropathy   . PONV (postoperative nausea and vomiting) seizure post-operatively   seizure following ankle surgery  . Potassium (K) deficiency   . Pregnant 12/25/2013  . Psychiatric pseudoseizure   . RLQ abdominal pain 07/18/2013  . Sciatica of left side 11/14/2014  . 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 Stress 09/18/2013    Past Surgical History: Past Surgical History:  Procedure Laterality Date  . BIOPSY  04/10/2015   Procedure: BIOPSY;  Surgeon: Daneil Dolin, MD;  Location: [facility];  Service: Endoscopy;;  . cath self every nite     for sodium bicarb injection (discontinued 2013)  . CHOLECYSTECTOMY  2005   biliary dyskinesia  . COLONOSCOPY  JUN 2012 ABD PN/DIARRHEA WITH PROPOFOL   NL COLON  . COLONOSCOPY WITH PROPOFOL N/A 01/16/2021   Procedure: COLONOSCOPY WITH PROPOFOL;  Surgeon: Harvel Quale, MD;  [facility];  Service: Gastroenterology;  Laterality: N/A;  1:45  . DILATION AND CURETTAGE OF UTERUS    . DILITATION & CURRETTAGE/HYSTROSCOPY WITH NOVASURE ABLATION N/A 03/24/2017   Procedure: DILATATION & CURETTAGE/HYSTEROSCOPY WITH NOVASURE ENDOMETRIAL ABLATION;  Surgeon: Jonnie Kind, MD;   Location: [facility];  Service: Gynecology;  Laterality: N/A;  . ESOPHAGEAL DILATION N/A 04/10/2015   Procedure: ESOPHAGEAL DILATION WITH 54FR MALONEY DILATOR;  Surgeon: Daneil Dolin, MD;  Location: [facility];  Service: Endoscopy;  Laterality: N/A;  . ESOPHAGOGASTRODUODENOSCOPY    . ESOPHAGOGASTRODUODENOSCOPY (EGD) WITH PROPOFOL N/A 04/10/2015   Procedure: ESOPHAGOGASTRODUODENOSCOPY (EGD) WITH PROPOFOL;  Surgeon: Daneil Dolin, MD;  Location: [facility];  Service: Endoscopy;  Laterality: N/A;  . ESOPHAGOGASTRODUODENOSCOPY (EGD) WITH PROPOFOL N/A 12/06/2016   Procedure: ESOPHAGOGASTRODUODENOSCOPY (EGD) WITH PROPOFOL;  Surgeon: Danie Binder, MD;  [facility];  Service: Endoscopy;  Laterality: N/A;  . ESOPHAGOGASTRODUODENOSCOPY (EGD) WITH PROPOFOL N/A 04/27/2019   Procedure: ESOPHAGOGASTRODUODENOSCOPY (EGD) WITH PROPOFOL;  Surgeon: Rogene Houston, MD;  [facility];  Service: Endoscopy;  Laterality: N/A;  730  . ESOPHAGOGASTRODUODENOSCOPY (EGD) WITH PROPOFOL N/A 08/31/2019   Procedure: ESOPHAGOGASTRODUODENOSCOPY (EGD) WITH PROPOFOL;  Surgeon: Rogene Houston, MD;  [facility];  Service: Endoscopy;  Laterality: N/A;  . ESOPHAGOGASTRODUODENOSCOPY (EGD) WITH PROPOFOL N/A 01/16/2021   Procedure: ESOPHAGOGASTRODUODENOSCOPY (EGD) WITH PROPOFOL;  Surgeon: Harvel Quale, MD;  [facility];  Service: Gastroenterology;  Laterality: N/A;  . GAB  2007   in [place]-POUCH 5 CM  . GASTRIC BYPASS  06/2006  . HYSTEROSCOPY WITH D & C N/A 09/12/2014   Procedure: DILATATION AND CURETTAGE /HYSTEROSCOPY;  Surgeon: Jonnie Kind, MD;  [facility]: [facility];  Service: Gynecology;  Laterality: N/A;  . KYPHOPLASTY N/A 08/02/2018   Procedure: KYPHOPLASTY T12;  Surgeon: Melina Schools, MD;  Location: [place];  Service: Orthopedics;  Laterality: N/A;  60 mins  . LAPAROSCOPIC TUBAL LIGATION Bilateral 03/24/2017   Procedure: LAPAROSCOPIC TUBAL LIGATION ([place] Rings);  Surgeon: Jonnie Kind, MD;  Location: [facility];  Service: Gynecology;  Laterality: Bilateral;  . REPAIR VAGINAL CUFF N/A 07/30/2014   Procedure: REPAIR VAGINAL CUFF;  Surgeon: Mora Bellman, MD;  Location: [facility];  Service: Gynecology;  Laterality: N/A;  . SAVORY DILATION  06/20/2012   Dr. Barnie Alderman gastritis/Ulcer in the mid jejunum. Empiric dilation.   . SURAL NERVE BX Left 02/25/2016   Procedure: LEFT SURAL NERVE BIOPSY;  Surgeon: Jovita Gamma, MD;  Location: [facility];  Service: Neurosurgery;  Laterality: Left;  Left sural nerve biopsy  . TONSILLECTOMY    . TONSILLECTOMY AND ADENOIDECTOMY    . UPPER GASTROINTESTINAL ENDOSCOPY  JULY 2011 NAUSEA-D125,V6, PH 25   Bx; GASTRITIS, POUCH-5 CM LONG  . WISDOM TOOTH EXTRACTION      Family History: Family History  Problem Relation Age of Onset  . Hemochromatosis Maternal Grandmother   . Migraines Maternal Grandmother   . Cancer Maternal Grandmother   . Breast cancer Maternal Grandmother   . Hypertension Father   . Diabetes Father   . Coronary artery disease Father   . Migraines Paternal Grandmother   . Breast cancer Paternal Grandmother   . Cancer Mother        breast  . Hemochromatosis Mother   . Breast cancer Mother   . Depression Mother   . Anxiety disorder Mother   . Coronary artery disease Paternal Grandfather   . Anxiety disorder Brother   . Bipolar disorder Brother   . Healthy Daughter   . Healthy Son     Social History: Social History   Tobacco Use  Smoking Status Former Smoker  . Packs/day: 0.25  . Years: 6.00  . Pack years: 1.50  . Types: Cigarettes  . Start date: 05/2018  Smokeless Tobacco Never Used   Social History   Substance and Sexual Activity  Alcohol Use Not Currently  . Alcohol/week: 0.0 standard drinks   Social History   Substance and Sexual Activity  Drug Use Not Currently    Allergies: Allergies  Allergen Reactions  . Gabapentin Other (See Comments)    Pt states that she was unresponsive  after taking this medication, but her vitals remained stable.    . Metoclopramide Hcl Anxiety and Other (See Comments)    Pt states that she felt like she was trapped in a box, and could not get out.  Pt also states that she had temporary loss of movement, weakness, and tingling.    . Tramadol Other (See Comments)    Seizures  . Ativan [Lorazepam] Other (See Comments)    combative  . Latex Itching, Rash and Other (See Comments)  Burns  . Lyrica [Pregabalin] Other (See Comments)    suicidal  . Iron Other (See Comments)    PATIENT IS INTOLERANT TO ORAL IRON , SHE DOES NOT ABSORB IT.    Marland Kitchen Sulfa Antibiotics Hives  . Butrans [Buprenorphine] Rash    Patch caused rash, didn't help with pain  . Nucynta [Tapentadol] Nausea And Vomiting  . Propofol Nausea And Vomiting  . Tape Hives, Itching and Rash    Please use paper tape  . Toradol [Ketorolac Tromethamine] Anxiety    Medications: Current Outpatient Medications  Medication Sig Dispense Refill  . cetirizine (ZYRTEC) 10 MG tablet TAKE ONE TABLET BY MOUTH DAILY 30 tablet 5  . AIMOVIG 70 MG/ML SOAJ Inject 70 mg as directed every 30 (thirty) days. (Patient not taking: Reported on 02/24/2021)    . albuterol (VENTOLIN HFA) 108 (90 Base) MCG/ACT inhaler Inhale 2 puffs into the lungs every 6 (six) hours as needed for wheezing or shortness of breath. (Patient not taking: Reported on 02/24/2021)    . amphetamine-dextroamphetamine (ADDERALL XR) 30 MG 24 hr capsule Take 1 capsule (30 mg total) by mouth every morning. 30 capsule 0  . amphetamine-dextroamphetamine (ADDERALL XR) 30 MG 24 hr capsule Take 1 capsule (30 mg total) by mouth every morning. 30 capsule 0  . budesonide-formoterol (SYMBICORT) 160-4.5 MCG/ACT inhaler Inhale 2 puffs into the lungs 2 (two) times daily. (Patient not taking: Reported on 02/24/2021) 1 Inhaler 5  . Buprenorphine HCl (BELBUCA) 600 MCG FILM Place 600 mcg inside cheek 2 (two) times daily.    . busPIRone (BUSPAR) 10 MG tablet  Take 1 tablet (10 mg total) by mouth 3 (three) times daily. 90 tablet 2  . Calcium Carb-Cholecalciferol (CALCIUM-VITAMIN D3) 600-500 MG-UNIT CAPS Take 1 tablet by mouth daily.    . Carboxymethylcellul-Glycerin (LUBRICATING EYE DROPS OP) Place 1 drop into both eyes daily as needed (dry eyes). (Patient not taking: Reported on 02/24/2021)    . clidinium-chlordiazePOXIDE (LIBRAX) 5-2.5 MG capsule Take 1 capsule by mouth 3 (three) times daily before meals. 90 capsule 5  . cyclobenzaprine (FLEXERIL) 10 MG tablet Take 1 tablet (10 mg total) by mouth 3 (three) times daily as needed for muscle spasms. 30 tablet 0  . DULoxetine (CYMBALTA) 60 MG capsule Take 1 capsule (60 mg total) by mouth 2 (two) times daily. 180 capsule 2  . fluconazole (DIFLUCAN) 150 MG tablet One po qd prn yeast infection; may repeat in 3-4 days if needed 2 tablet 0  . HYDROmorphone (DILAUDID) 4 MG tablet Take 4 mg by mouth every 4 (four) hours as needed for severe pain.    Marland Kitchen lansoprazole (PREVACID) 30 MG capsule TAKE ONE CAPSULE BY MOUTH TWICE A DAY BEFORE A MEAL. 180 capsule 0  . levalbuterol (XOPENEX HFA) 45 MCG/ACT inhaler INHALE (2) PUFFS INTO THE LUNGS EVERY SIX HOURS AS NEEDED FOR WHEEZING. 15 g 3  . levalbuterol (XOPENEX) 1.25 MG/3ML nebulizer solution Take 1.25 mg by nebulization every 4 (four) hours as needed for wheezing. 50 mL 3  . linaclotide (LINZESS) 290 MCG CAPS capsule Take 1 capsule (290 mcg total) by mouth daily before breakfast. 90 capsule 3  . naloxegol oxalate (MOVANTIK) 12.5 MG TABS tablet Take 1 tablet (12.5 mg total) by mouth daily. 90 tablet 3  . ondansetron (ZOFRAN) 8 MG tablet Take 1 tablet (8 mg total) by mouth every 8 (eight) hours as needed for nausea. 12 tablet 3  . ondansetron (ZOFRAN-ODT) 8 MG disintegrating tablet Take 1 tablet (8 mg total)  by mouth every 8 (eight) hours as needed. 50 tablet 3  . Pediatric Multivit-Minerals-C (FLINTSTONES GUMMIES) chewable tablet Chew 3 tablets by mouth daily.    .  promethazine (PHENERGAN) 12.5 MG tablet TAKE 1 TABLET BY MOUTH EVERY 8 HOURS AS NEEDED FOR NAUSEA OR VOMITING 30 tablet 0  . promethazine (PHENERGAN) 25 MG tablet Take 25 mg by mouth 2 (two) times daily as needed for nausea or vomiting.    Marland Kitchen rOPINIRole (REQUIP) 1 MG tablet Take one tablet po each evening. (Patient taking differently: Take 1 mg by mouth at bedtime.) 30 tablet 5  . sucralfate (CARAFATE) 1 g tablet Take 1 tablet (1 g total) by mouth 4 (four) times daily -  with meals and at bedtime. 90 tablet 0  . traZODone (DESYREL) 100 MG tablet Take 2 tablets (200 mg total) by mouth at bedtime. 60 tablet 2  . valACYclovir (VALTREX) 1000 MG tablet TAKE 1 TABLET BY MOUTH DAILY. (Patient taking differently: Take 1,000 mg by mouth daily as needed (fever blisters).) 90 tablet PRN  . Vitamin D, Ergocalciferol, (DRISDOL) 1.25 MG (50000 UNIT) CAPS capsule Take 50,000 Units by mouth every Wednesday.     No current facility-administered medications for this visit.   Facility-Administered Medications Ordered in Other Visits  Medication Dose Route Frequency Provider Last Rate Last Admin  . ferumoxytol (FERAHEME) 510 mg in sodium chloride 0.9 % 100 mL IVPB  510 mg Intravenous Weekly Montez Morita, Valery Amedee, MD        Review of Systems: GENERAL: negative for malaise, night sweats HEENT: No changes in hearing or vision, no nose bleeds or other nasal problems. NECK: Negative for lumps, goiter, pain and significant neck swelling RESPIRATORY: Negative for cough, wheezing CARDIOVASCULAR: Negative for chest pain, leg swelling, palpitations, orthopnea GI: SEE HPI MUSCULOSKELETAL: Negative for joint pain or swelling, back pain, and muscle pain. SKIN: Negative for lesions, rash PSYCH: Negative for sleep disturbance, mood disorder and recent psychosocial stressors. HEMATOLOGY Negative for prolonged bleeding, bruising easily, and swollen nodes. ENDOCRINE: Negative for cold or heat intolerance, polyuria,  polydipsia and goiter. NEURO: negative for tremor, gait imbalance, syncope and seizures. The remainder of the review of systems is noncontributory.   Physical Exam: BP 125/87 (BP Location: Left Arm, Patient Position: Sitting, Cuff Size: Small)   Pulse (!) 121   Temp 97.6 F (36.4 C) (Oral)   Ht $R'5\' 6"'gX$  (1.676 m)   Wt 157 lb (71.2 kg)   BMI 25.34 kg/m  GENERAL: The patient is AO x3, in no acute distress. HEENT: Head is normocephalic and atraumatic. EOMI are intact. Mouth is well hydrated and without lesions. NECK: Supple. No masses LUNGS: Clear to auscultation. No presence of rhonchi/wheezing/rales. Adequate chest expansion HEART: RRR, normal s1 and s2. ABDOMEN: tender in upper abdomen but worse in RUQ, no guarding, no peritoneal signs, and nondistended. BS +. No masses. EXTREMITIES: Without any cyanosis, clubbing, rash, lesions or edema. NEUROLOGIC: AOx3, no focal motor deficit. SKIN: no jaundice, no rashes  Imaging/Labs: as above  I personally reviewed and interpreted the available labs, imaging and endoscopic files.  Impression and Plan: Penny [name] is a 35 y.o. 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  The patient has had extensive investigations that have showed only presence of a gastrojejunal anastomotic ulcer.  For this, she is  currently on PPI and Carafate which she should continue taking, I will refill the PPI.  She is supposed to have a repeat EGD next month to assess to assess wound healing.  I suspect she will still have presence of an ulcer at that time as she is still smoking and ran out of her medication 1 week ago.  I emphasized the importance of quitting smoking to improve the ulcer size.  She will also follow with her  bariatric surgeon to determine if she needs an eventual revision.  Also, regarding her abdominal pain, she likely is presenting functional abdominal pain due to bowel hypersensitivity exacerbated by her chronic opiate intake.  Overall, this has improved in severity as well as her nausea and vomiting with the use of Librax which she should continue indefinitely.  Her bowel movement frequency has also improved.  This is related to opiate induced constipation.  I will increase her Movantik 25 mg every day and she will need to continue taking the Linzess 290 mcg every day.  Finally, in terms of her iron deficiency anemia, this is likely related to her gastrojejunal ulcer.  She should proceed with her scheduled IV iron infusion but there is no need for any blood transfusion at this time.  - Continue Librax 3 times a day before meals - Continue Carafate as needed for abdominal pain - Increase Movantik to 25 mg every day - Continue Linzess 290 mcg every day - Continue Prevacid 30 mg twice a day, please open capsules and swallow granules - Proceed with scheduled EGD - Proceed with scheduled IV iron infusion - Smoking cessation  All questions were answered.      Harvel Quale, MD Gastroenterology and Hepatology [facility]

## 2021-03-30 ENCOUNTER — Telehealth: Payer: Self-pay | Admitting: Nutrition

## 2021-03-30 ENCOUNTER — Encounter: Payer: Medicaid Other | Attending: Family Medicine | Admitting: Nutrition

## 2021-03-30 DIAGNOSIS — K582 Mixed irritable bowel syndrome: Secondary | ICD-10-CM | POA: Insufficient documentation

## 2021-03-30 DIAGNOSIS — Z9884 Bariatric surgery status: Secondary | ICD-10-CM | POA: Insufficient documentation

## 2021-03-30 NOTE — Telephone Encounter (Signed)
Pt didn't sign on to my chart. Left VM message to return call to reschedule appointment.

## 2021-03-31 ENCOUNTER — Encounter (HOSPITAL_COMMUNITY): Payer: Self-pay

## 2021-03-31 ENCOUNTER — Encounter (HOSPITAL_COMMUNITY)
Admission: RE | Admit: 2021-03-31 | Discharge: 2021-03-31 | Disposition: A | Discharge status: Home / Self Care | Payer: Medicaid Other | Source: Ambulatory Visit | Attending: Gastroenterology | Admitting: Gastroenterology

## 2021-03-31 MED ORDER — SODIUM CHLORIDE 0.9 % IV SOLN
510.0000 mg | Freq: Once | INTRAVENOUS | Status: DC
  Filled 2021-03-31: qty 17

## 2021-04-02 ENCOUNTER — Encounter (HOSPITAL_COMMUNITY): Payer: Medicaid Other

## 2021-04-02 ENCOUNTER — Ambulatory Visit: Payer: Medicaid Other | Admitting: Family Medicine

## 2021-04-06 ENCOUNTER — Telehealth: Payer: Self-pay | Admitting: Neurology

## 2021-04-06 ENCOUNTER — Other Ambulatory Visit: Payer: Self-pay

## 2021-04-06 NOTE — Telephone Encounter (Signed)
  1. Which medications need to be refilled? (please list name of each medication and dose if known) Amovig   2. Which pharmacy/location (including street and city if local pharmacy) is medication to be sent to? [facility]

## 2021-04-06 NOTE — Telephone Encounter (Signed)
Patient scheduled for may 6, okay to refill its been over a year since last visit.

## 2021-04-06 NOTE — Telephone Encounter (Signed)
I will refill after her follow-up visit, so I know how she is doing on the medication.

## 2021-04-06 NOTE — Telephone Encounter (Signed)
Patient advised to follow up and will discuss on follow up

## 2021-04-07 ENCOUNTER — Encounter (HOSPITAL_COMMUNITY): Payer: Self-pay

## 2021-04-07 ENCOUNTER — Encounter (HOSPITAL_COMMUNITY)
Admission: RE | Admit: 2021-04-07 | Discharge: 2021-04-07 | Disposition: A | Discharge status: Home / Self Care | Payer: Medicaid Other | Source: Ambulatory Visit | Attending: Gastroenterology | Admitting: Gastroenterology

## 2021-04-07 ENCOUNTER — Other Ambulatory Visit: Payer: Self-pay

## 2021-04-07 DIAGNOSIS — D509 Iron deficiency anemia, unspecified: Secondary | ICD-10-CM | POA: Diagnosis not present

## 2021-04-07 MED ORDER — SODIUM CHLORIDE 0.9 % IV SOLN
510.0000 mg | Freq: Once | INTRAVENOUS | Status: AC
  Administered 2021-04-07: 510 mg via INTRAVENOUS
  Filled 2021-04-07: qty 510

## 2021-04-07 MED ORDER — SODIUM CHLORIDE 0.9 % IV SOLN
INTRAVENOUS | Status: DC
  Administered 2021-04-07: 20 mL via INTRAVENOUS

## 2021-04-08 ENCOUNTER — Other Ambulatory Visit (INDEPENDENT_AMBULATORY_CARE_PROVIDER_SITE_OTHER): Payer: Self-pay

## 2021-04-08 ENCOUNTER — Ambulatory Visit: Payer: Medicaid Other | Admitting: Nutrition

## 2021-04-09 ENCOUNTER — Ambulatory Visit (INDEPENDENT_AMBULATORY_CARE_PROVIDER_SITE_OTHER): Payer: Medicaid Other | Admitting: Clinical

## 2021-04-09 ENCOUNTER — Other Ambulatory Visit (HOSPITAL_COMMUNITY): Payer: Self-pay | Admitting: General Surgery

## 2021-04-09 ENCOUNTER — Other Ambulatory Visit: Payer: Self-pay

## 2021-04-09 DIAGNOSIS — F411 Generalized anxiety disorder: Secondary | ICD-10-CM

## 2021-04-09 DIAGNOSIS — F332 Major depressive disorder, recurrent severe without psychotic features: Secondary | ICD-10-CM | POA: Diagnosis not present

## 2021-04-09 DIAGNOSIS — Z9884 Bariatric surgery status: Secondary | ICD-10-CM

## 2021-04-09 IMAGING — CR CHEST - 2 VIEW
2 series · 2 of 2 positions shown · non-contrast
Comparison: 08/02/2018

CLINICAL DATA: Short of breath and chest pain

EXAM:
CHEST - 2 VIEW

[chest lat]
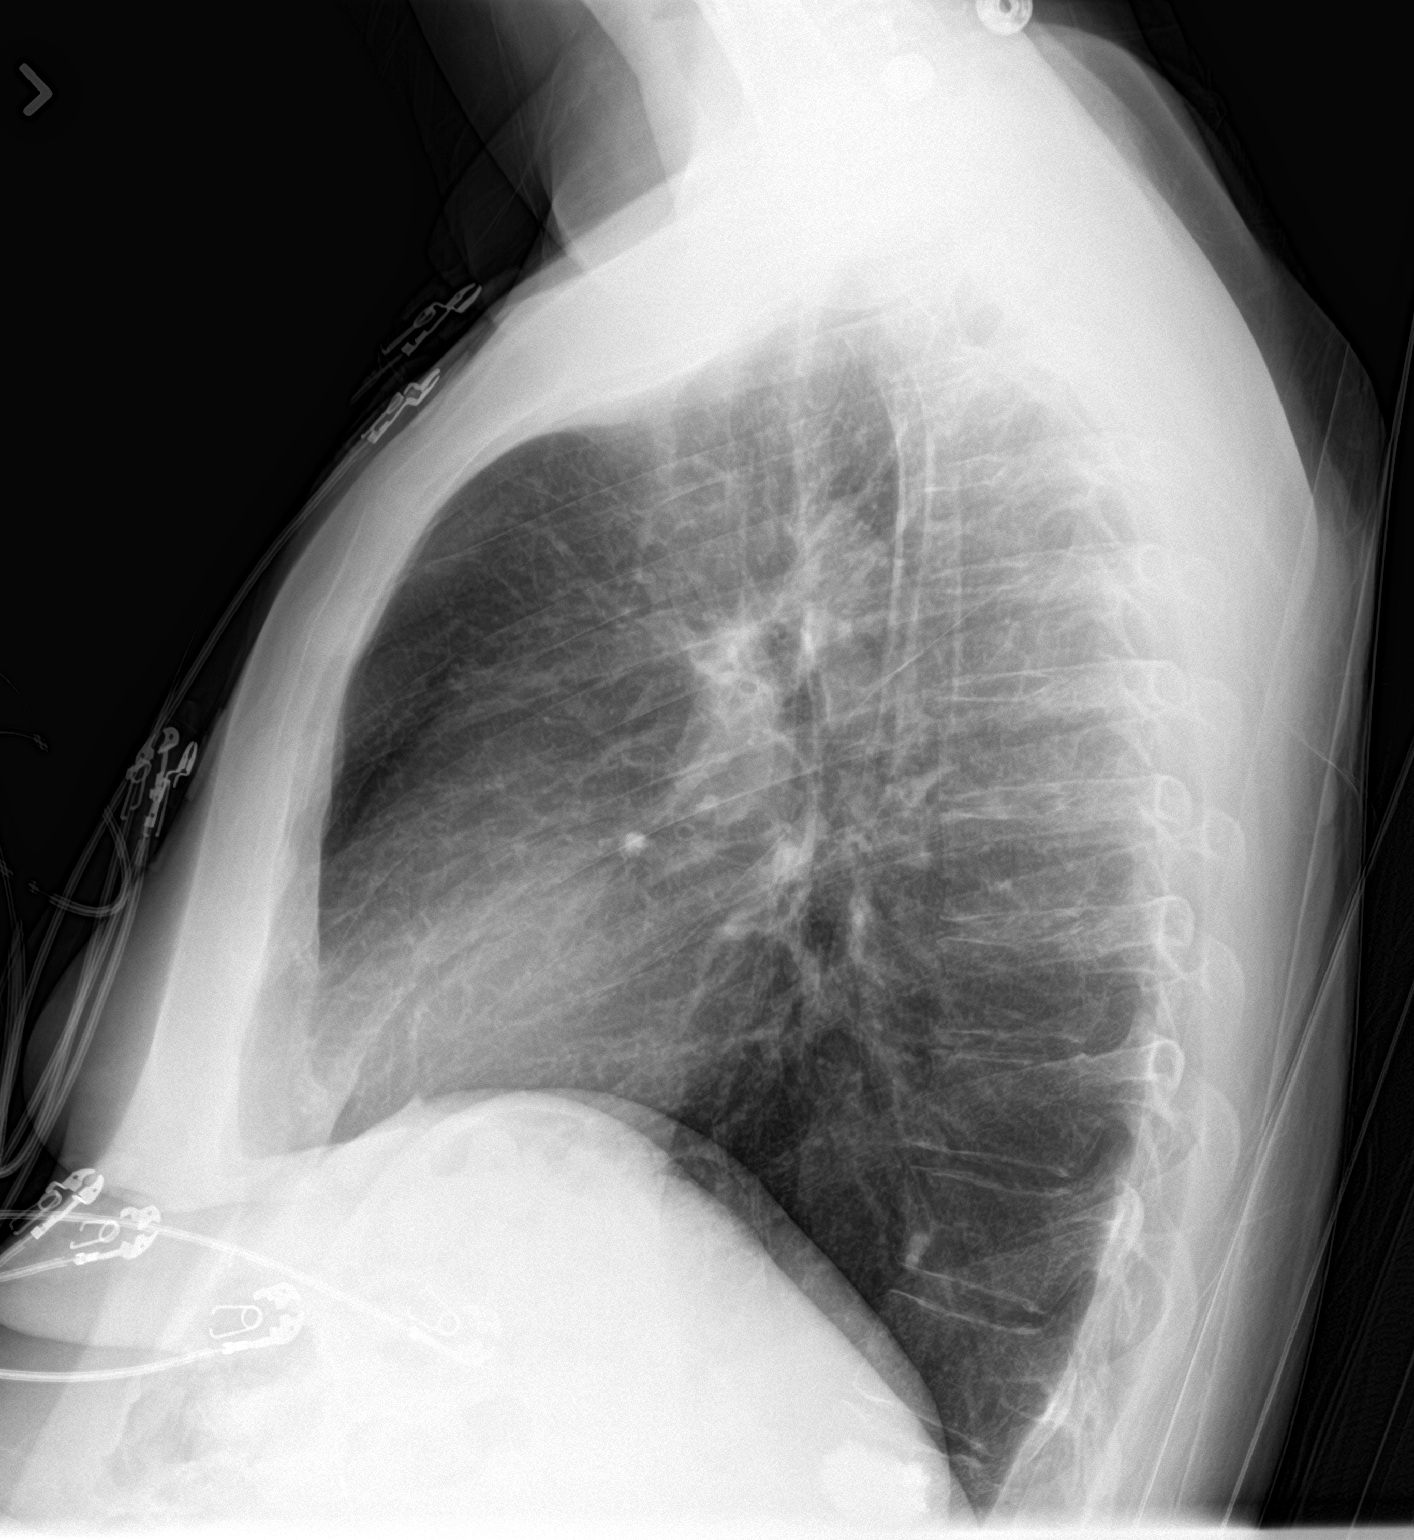

[chest ap]
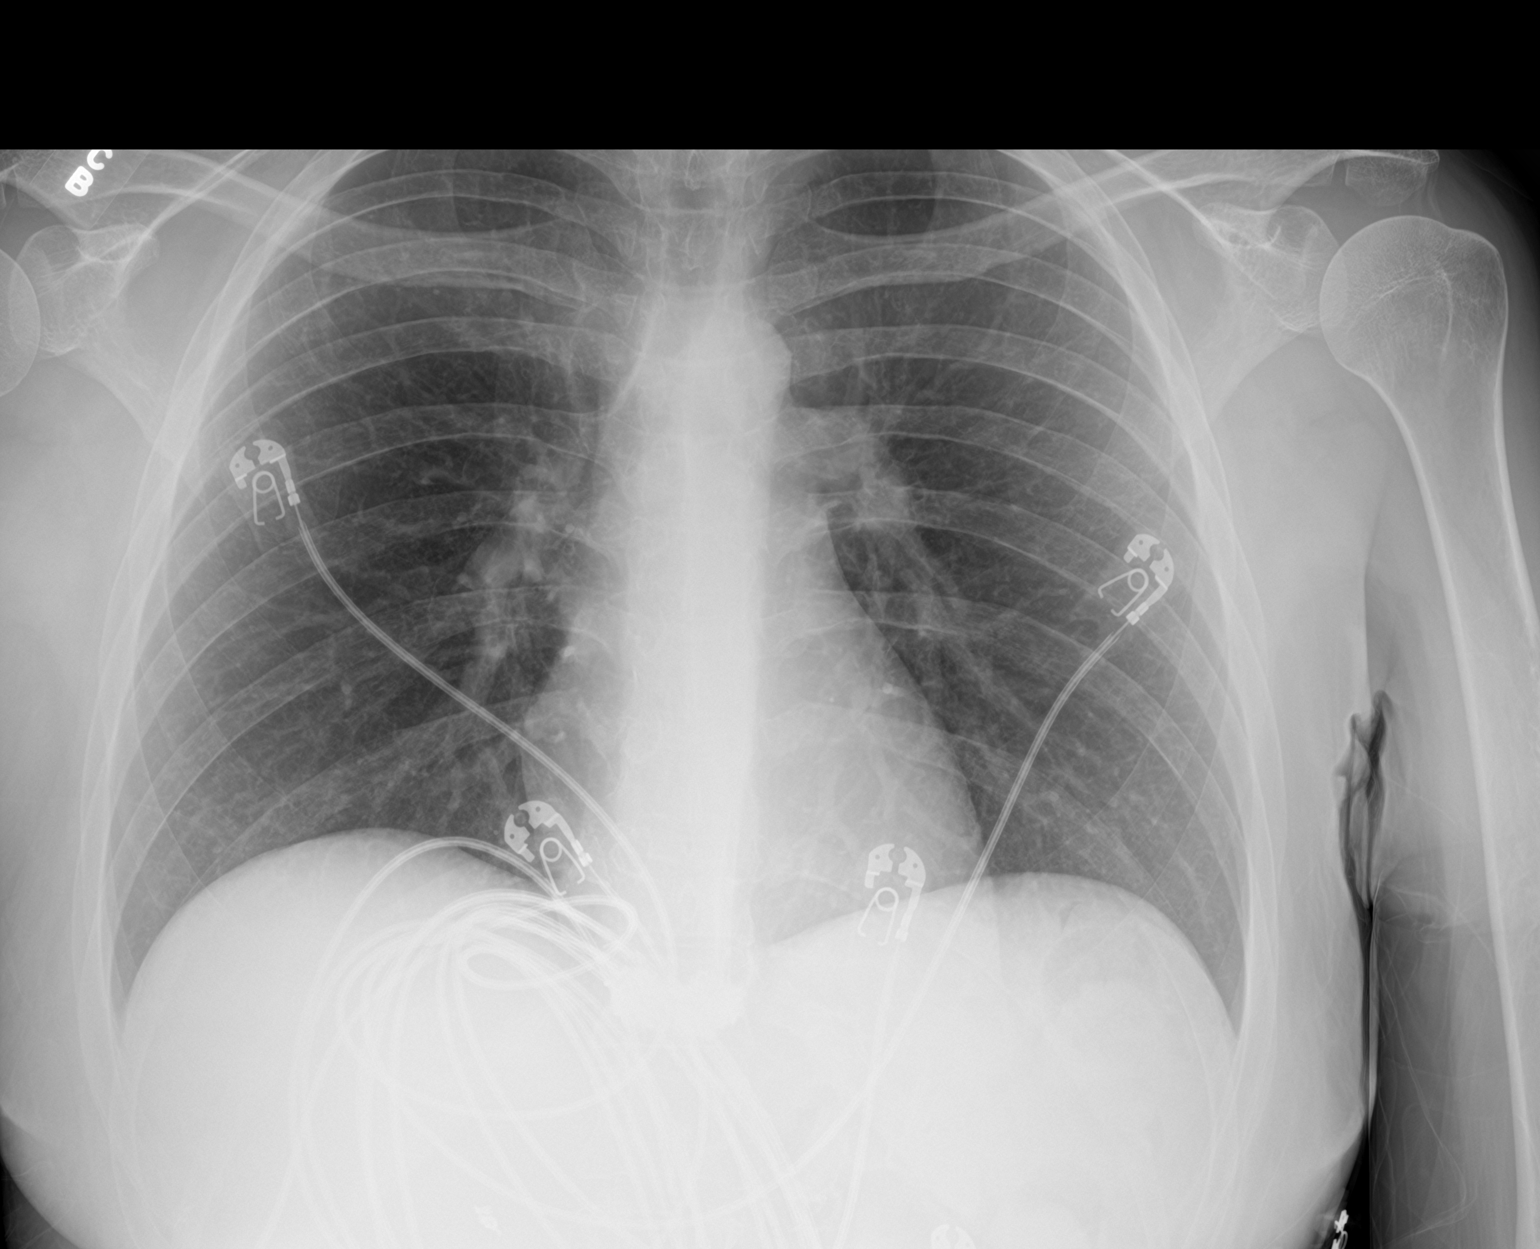

[2 of 2 positions shown; findings below may reference images not displayed]

FINDINGS: Normal heart size. No pleural effusion or edema. No airspace
opacities. Review of the visualized osseous structures is
unremarkable.
IMPRESSION: 1. No acute cardiopulmonary abnormalities.

## 2021-04-09 IMAGING — CT CT ANGIO CHEST-ABD-PELV FOR DISSECTION W/ AND WO/W CM
2 of 7 series · 14 of 46 positions shown, 16 images · IV contrast (omnipaque)
Comparison: None.

CLINICAL DATA: Dizziness, syncope, chest pressure, shortness of
breath

EXAM:
CT ANGIOGRAPHY CHEST, ABDOMEN AND PELVIS
TECHNIQUE: Multidetector CT imaging through the chest, abdomen and pelvis was
performed using the standard protocol during bolus administration of
intravenous contrast. Multiplanar reconstructed images and MIPs were
obtained and reviewed to evaluate the vascular anatomy.
CONTRAST:  100mL OMNIPAQUE IOHEXOL 350 MG/ML SOLN

[Series 7: dissection 2mm · axial · 0.87mm/px · z∈[+658,+1270]mm · 11 of 344 slices shown, 13 images]
[im 19/344  soft-tissue]
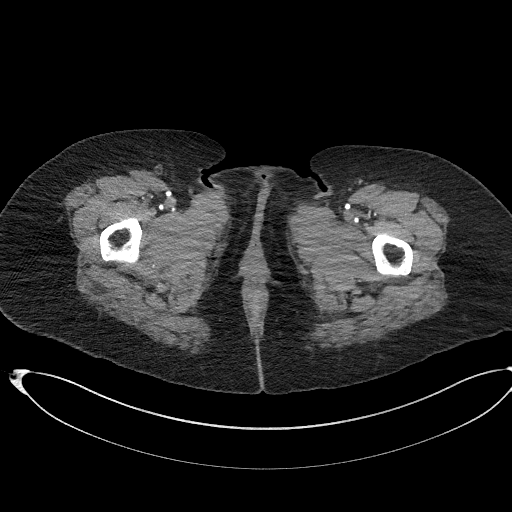
[im 19/344  bone]
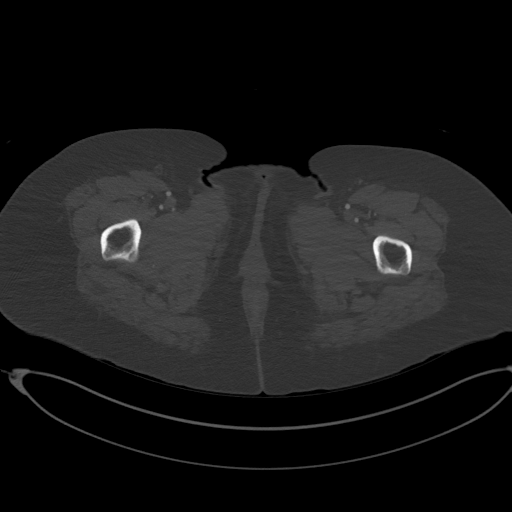
[im 55/344  soft-tissue]
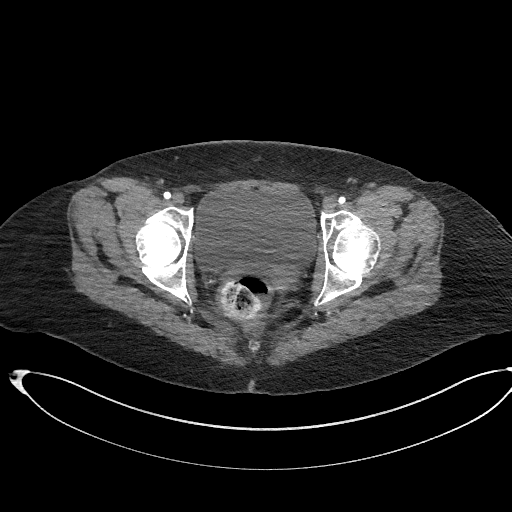
[im 91/344  soft-tissue]
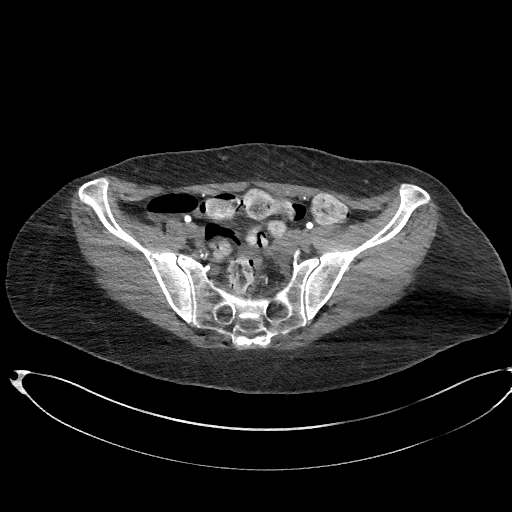
[im 109/344  soft-tissue]
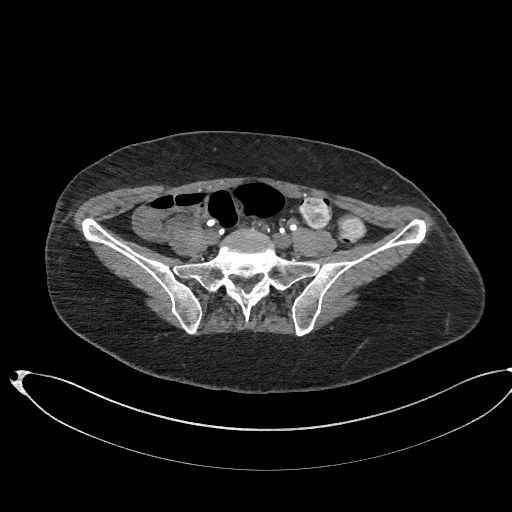
[im 145/344  soft-tissue]
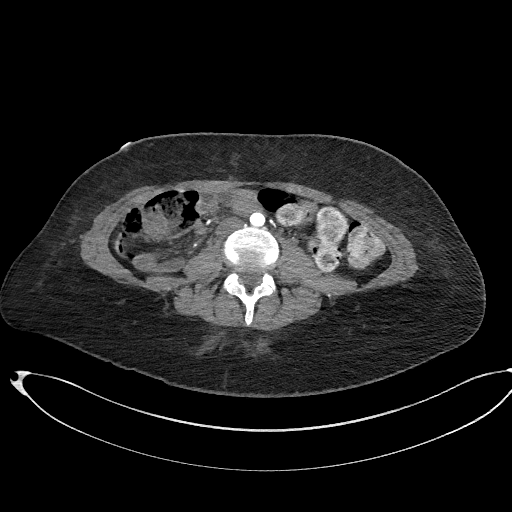
[im 181/344  soft-tissue]
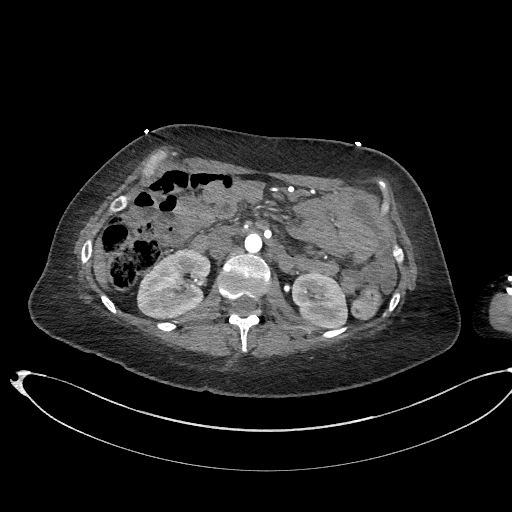
[im 199/344  soft-tissue]
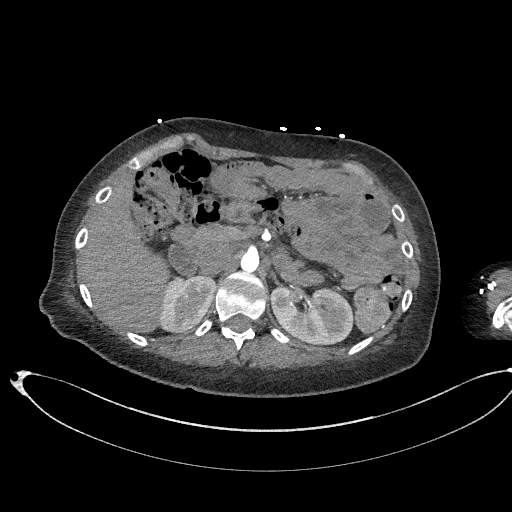
[im 235/344  soft-tissue]
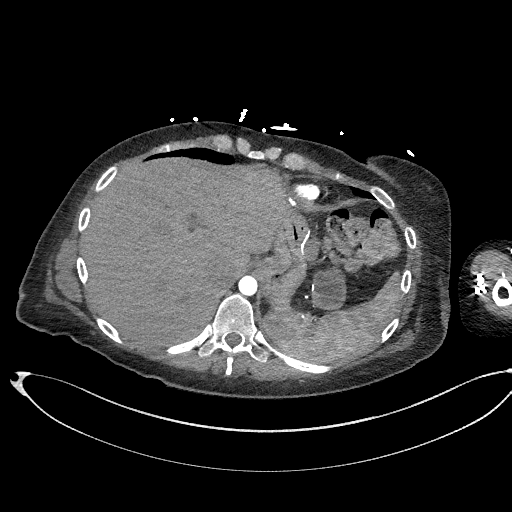
[im 253/344  soft-tissue]
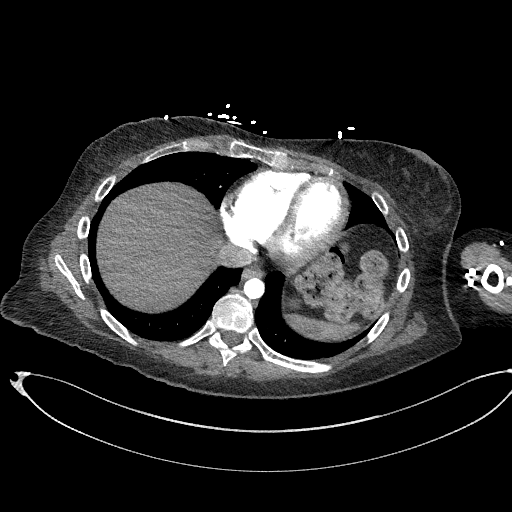
[im 253/344  bone]
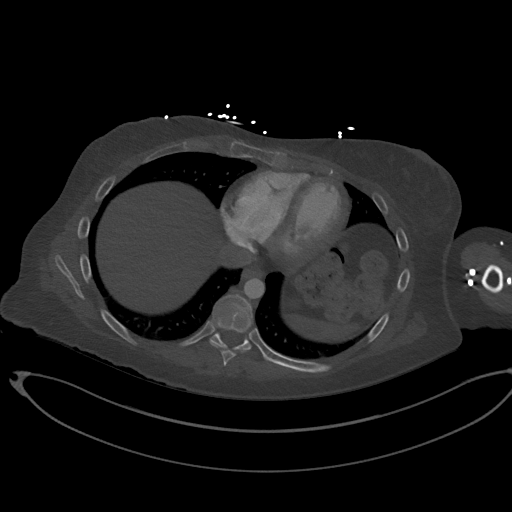
[im 289/344  soft-tissue]
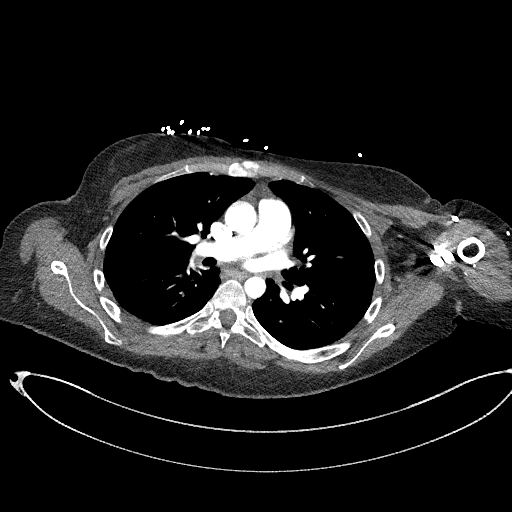
[im 325/344  soft-tissue]
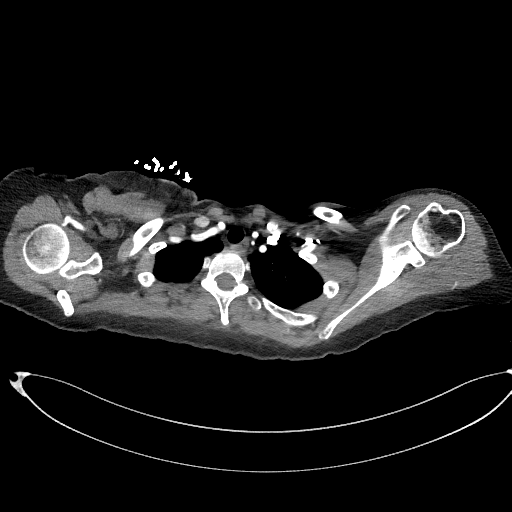

[Series 10: dissection 2mm cor · coronal · 0.88mm/px · 3 of 144 slices shown]
[im 36/144  soft-tissue]
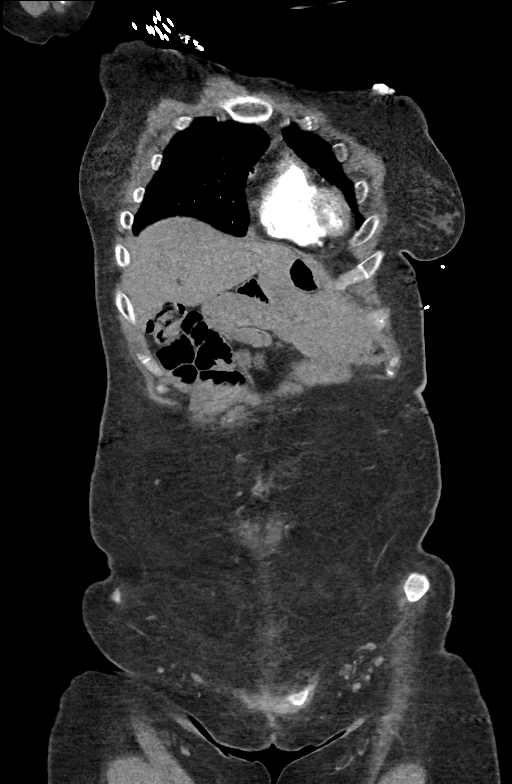
[im 72/144  soft-tissue]
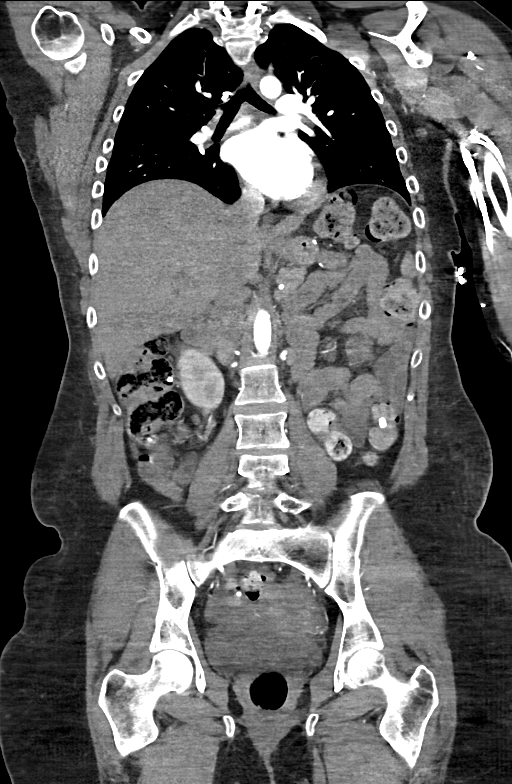
[im 108/144  soft-tissue]
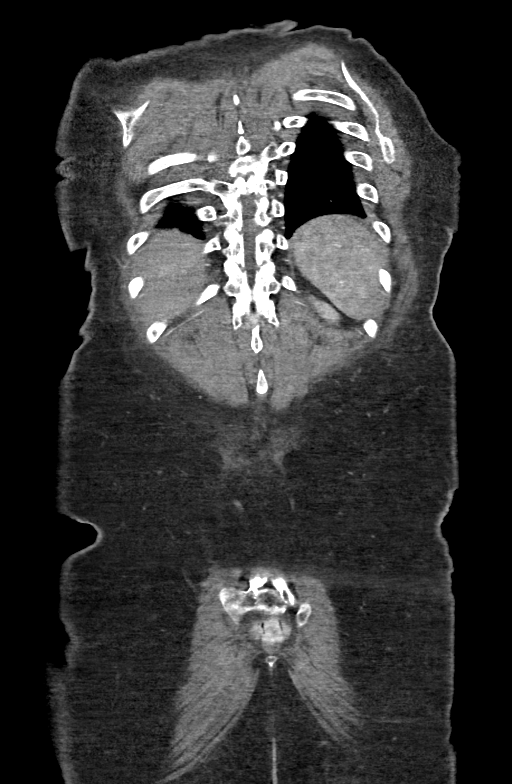

[14 of 46 positions shown; findings below may reference images not displayed]

FINDINGS: CTA CHEST FINDINGS

Cardiovascular: Preferential opacification of the thoracic aorta. No
evidence of thoracic aortic aneurysm or dissection. Incidental note
of direct origin of the left vertebral artery from the aortic arch.
Normal heart size. No pericardial effusion.

Mediastinum/Nodes: No enlarged mediastinal, hilar, or axillary lymph
nodes. Thyroid gland, trachea, and esophagus demonstrate no
significant findings.

Lungs/Pleura: Lungs are clear. No pleural effusion or pneumothorax.

Upper Abdomen: No acute abnormality.

Musculoskeletal: No chest wall abnormality. No acute or significant
osseous findings.

Review of the MIP images confirms the above findings.

CTA ABDOMEN AND PELVIS FINDINGS

VASCULAR

Aorta: Normal caliber aorta without aneurysm, dissection, vasculitis
or significant stenosis.

Celiac: Patent without evidence of aneurysm, dissection, vasculitis
or significant stenosis.

SMA: Patent without evidence of aneurysm, dissection, vasculitis or
significant stenosis.

Renals: Both renal arteries are patent without evidence of aneurysm,
dissection, vasculitis, fibromuscular dysplasia or significant
stenosis.

IMA: Patent without evidence of aneurysm, dissection, vasculitis or
significant stenosis.

Inflow: Patent without evidence of aneurysm, dissection, vasculitis
or significant stenosis.

Veins: No obvious venous abnormality within the limitations of this
arterial phase study.

Review of the MIP images confirms the above findings.

NON-VASCULAR

Hepatobiliary: No focal liver abnormality is seen. Status post
cholecystectomy. No biliary dilatation.

Pancreas: Unremarkable. No pancreatic ductal dilatation or
surrounding inflammatory changes.

Spleen: Normal in size without focal abnormality.

Adrenals/Urinary Tract: Adrenal glands are unremarkable. Kidneys are
normal, without renal calculi, focal lesion, or hydronephrosis.
Bladder is unremarkable.

Stomach/Bowel: Postoperative findings of Roux-en-Y gastric bypass.
Appendix appears normal. No evidence of bowel wall thickening,
distention, or inflammatory changes. Large burden of stool in the
colon and dense stool balls in the distal colon and rectum.

Vascular/Lymphatic: No significant vascular findings are present. No
enlarged abdominal or pelvic lymph nodes.

Reproductive: No mass or other abnormality.

Other: No abdominal wall hernia or abnormality. No abdominopelvic
ascites.

Musculoskeletal: No acute or significant osseous findings.
IMPRESSION: 1. Normal contour and caliber of the aorta. No evidence of aortic
dissection, aneurysm, or other aortic pathology. No significant
atherosclerosis.

2.  Status post gastric bypass and cholecystectomy.

3. Large burden of stool in the colon and dense stool balls in the
distal colon and rectum.

## 2021-04-09 NOTE — Progress Notes (Signed)
Virtual Visit via Video Note  I connected withEmily [name] on 04/09/21 at  8:00 AM EST by a video enabled telemedicine application and verified that I am speaking with the correct person using two identifiers.  Location: Patient: Home Provider: Office  I discussed the limitations of evaluation and management by telemedicine and the availability of in person appointments. The patient expressed understanding and agreed to proceed.    THERAPIST PROGRESS NOTE  Session Time:8:00AM-8:55AM  Participation Level:Active  Behavioral Response:CasualAlertDepressedand NA  Type of Therapy:Individual Therapy  Treatment Goals addressed:Coping  Interventions:CBT, Motivational Interviewing, Strength-based and Supportive  Summary:Penny [name] a33y.o.femalewho presents withGAD and MDD.The OPT therapist worked with thepatientfor herongoingOPT treatment session. The OPT therapist utilized Motivational Interviewing to assist in creating therapeutic repore. The patient in the session was engaged and work in Science writer about hertriggers and symptoms over the past few weeksincludinginteractions with family including her son deciding that he wants to live with his Father full time.The OPT therapist worked with the patient implementing elements of CBT.The OPT therapist provided psycho-education during the session.The OPT therapist worked with the patient onusing her supports and communicating her feelingsand focusing in areas that she has control in. The patient spoke about the impact of her grandmother being in the hospital and her son deciding he wants to live with his Father.   Suicidal/Homicidal:Nowithout intent/plan  Therapist Response:The OPT therapist worked with the patient for the patientsscheduled session. The patient was engaged in hersession and gave feedback in relation to triggers, symptoms, and behavior responses over the  pastfewweeks. The patienthas been having difficultymanaging symptoms of herGAD and MDD.The OPT therapist in [place] the patient and utilized elements of CBT in working with the patient to help empower the patientto set healthy boundaries and put her effort into areas she has control to make decisions about.The OPT therapist worked with the patient on, positive thinking, communication of her feelings and not allowing automatic negative thoughts dominate, and use her coping strategies. The patient spoke about having her son for Mothers Day weekend this upcoming weekend. The patient spoke about her plans to address the adult/child dynamics and custody as well as ideally working with the Father to better co-parent moving forward.  Plan: Return again in2/3weeks.  Diagnosis:Axis I:Depressionand GAD  Axis II:No diagnosis  I discussed the assessment and treatment plan with the patient. The patient was provided an opportunity to ask questions and all were answered. The patient agreed with the plan and demonstrated an understanding of the instructions.  The patient was advised to call back or seek an in-person evaluation if the symptoms worsen or if the condition fails to improve as anticipated.  I provided 55 minutes of non-face-to-face time during this encounter.  Lennox Grumbles, LCSW  04/09/2021

## 2021-04-10 ENCOUNTER — Ambulatory Visit: Payer: Medicaid Other | Admitting: Allergy & Immunology

## 2021-04-10 ENCOUNTER — Encounter: Payer: Self-pay | Admitting: Neurology

## 2021-04-10 ENCOUNTER — Ambulatory Visit: Payer: Medicaid Other | Admitting: Neurology

## 2021-04-10 ENCOUNTER — Other Ambulatory Visit: Payer: Self-pay

## 2021-04-10 VITALS — BP 128/82 | HR 86 | Ht 66.0 in | Wt 156.0 lb

## 2021-04-10 DIAGNOSIS — G608 Other hereditary and idiopathic neuropathies: Secondary | ICD-10-CM

## 2021-04-10 DIAGNOSIS — G43709 Chronic migraine without aura, not intractable, without status migrainosus: Secondary | ICD-10-CM

## 2021-04-10 MED ORDER — AIMOVIG 70 MG/ML ~~LOC~~ SOAJ: 70.0000 mg | SUBCUTANEOUS | 11 refills | Status: DC

## 2021-04-10 NOTE — Patient Instructions (Signed)
Please follow-up with your psychiatrist as many of your symptoms seem to be stemming from mood disorder, including the confusional spells.  Refill for Aimovig has been sent to your pharmacy.  Return to clinic in 1 year

## 2021-04-10 NOTE — Progress Notes (Signed)
Follow-up Visit   Date: 04/10/21   Penny [name] MRN: [id number] DOB: [birth year]   Interim History: Penny [name] is a 35 y.o. 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  The patient was accompanied to the clinic by mother who also provides collateral information.    She began having spells of confusion started in November 2021. She has sudden onset of disorientation, having visual hallucinations, and having conversations with people that were not there, extremely agitated and combative.  This has happened 4-5 times since then, each spell lasting about 1-3 days.  Upon further questioning, she notices a pattern of stressful event around each spell.  MRI brain from February 2022 was normal.  She is seeing psychiatry and a counselor for depression.  Mood is fair.  She has a lot of back pain and was [facility]el [name]  She needs to transfer to another spine specialist.    With respect to her headaches, she was doing well on Aimovig with migraines occurring about once per month and responsive to Aleve.  She has missed her last visit and has been out of her medication.  Headaches have become more frequent.   She continues to have spells of numbness of the arms and legs.    Medications:  Current Outpatient Medications on File Prior to Visit  Medication Sig Dispense Refill  . amphetamine-dextroamphetamine (ADDERALL XR) 30 MG 24 hr capsule Take 1 capsule (30 mg total) by mouth every morning. 30 capsule 0  . Buprenorphine HCl 900 MCG FILM Place 900 mcg inside cheek 2 (two) times daily.    . busPIRone (BUSPAR) 10 MG tablet Take 1 tablet (10 mg total) by mouth 3 (three) times daily. (Patient taking differently: Take 10 mg by mouth 3 (three) times daily as needed  (anxiety). As needed per patient.) 90 tablet 2  . Calcium Carb-Cholecalciferol (CALCIUM-VITAMIN D3) 600-500 MG-UNIT CAPS Take 1 tablet by mouth daily.    . Carboxymethylcellul-Glycerin (LUBRICATING EYE DROPS OP) Place 1 drop into both eyes daily as needed (dry eyes).    . cetirizine (ZYRTEC) 10 MG tablet TAKE ONE TABLET BY MOUTH DAILY (Patient taking differently: Take 10 mg by mouth daily.) 30 tablet 5  . clidinium-chlordiazePOXIDE (LIBRAX) 5-2.5 MG capsule Take 1 capsule by mouth 3 (three) times daily before meals. 90 capsule 5  . clonazePAM (KLONOPIN) 0.5 MG tablet Take 0.5 mg by mouth 2 (two) times daily as needed.    . cyclobenzaprine (FLEXERIL) 10 MG tablet Take 1 tablet (10 mg total) by mouth 3 (three) times daily as needed for muscle spasms. 30 tablet 0  . DULoxetine (CYMBALTA) 60 MG capsule Take 1 capsule (60 mg total) by mouth 2 (two) times daily. 180 capsule 2  . fluconazole (DIFLUCAN) 150 MG tablet One po qd prn yeast infection; may repeat in 3-4 days if needed (Patient taking differently: Take 150 mg by mouth See admin instructions. One po qd prn yeast infection; may repeat in 3-4 days if needed) 2 tablet 0  . HYDROmorphone (DILAUDID) 4 MG tablet Take 4 mg by mouth every 4 (four) hours as needed for severe pain.    Marland Kitchen lansoprazole (PREVACID) 30 MG capsule Take 1 capsule (30 mg total) by mouth 2 (two) times daily before a meal. 180 capsule 1  . levalbuterol (XOPENEX HFA) 45 MCG/ACT  inhaler INHALE (2) PUFFS INTO THE LUNGS EVERY SIX HOURS AS NEEDED FOR WHEEZING. (Patient taking differently: Inhale 2 puffs into the lungs every 6 (six) hours as needed for wheezing or shortness of breath. INHALE (2) PUFFS INTO THE LUNGS EVERY SIX HOURS AS NEEDED FOR WHEEZING.) 15 g 3  . levalbuterol (XOPENEX) 1.25 MG/3ML nebulizer solution Take 1.25 mg by nebulization every 4 (four) hours as needed for wheezing. 50 mL 3  . linaclotide (LINZESS) 290 MCG CAPS capsule Take 1 capsule (290 mcg total) by mouth daily  before breakfast. 90 capsule 3  . naloxegol oxalate (MOVANTIK) 12.5 MG TABS tablet Take 2 tablets (25 mg total) by mouth daily. 90 tablet 3  . ondansetron (ZOFRAN) 8 MG tablet Take 1 tablet (8 mg total) by mouth every 8 (eight) hours as needed for nausea. 12 tablet 3  . Pediatric Multivit-Minerals-C (FLINTSTONES GUMMIES) chewable tablet Chew 3 tablets by mouth daily.    . promethazine (PHENERGAN) 12.5 MG tablet TAKE 1 TABLET BY MOUTH EVERY 8 HOURS AS NEEDED FOR NAUSEA OR VOMITING (Patient taking differently: Take 12.5 mg by mouth every 8 (eight) hours as needed for nausea or vomiting.) 30 tablet 0  . promethazine (PHENERGAN) 25 MG tablet Take 25 mg by mouth 2 (two) times daily as needed for nausea or vomiting.    Marland Kitchen rOPINIRole (REQUIP) 1 MG tablet Take one tablet po each evening. (Patient taking differently: Take 1 mg by mouth at bedtime.) 30 tablet 5  . sucralfate (CARAFATE) 1 g tablet Take 1 tablet (1 g total) by mouth 4 (four) times daily -  with meals and at bedtime. 180 tablet 3  . traZODone (DESYREL) 100 MG tablet Take 2 tablets (200 mg total) by mouth at bedtime. 60 tablet 2  . valACYclovir (VALTREX) 1000 MG tablet TAKE 1 TABLET BY MOUTH DAILY. (Patient taking differently: Take 1,000 mg by mouth daily as needed (fever blisters).) 90 tablet PRN  . Vitamin D, Ergocalciferol, (DRISDOL) 1.25 MG (50000 UNIT) CAPS capsule Take 50,000 Units by mouth every Wednesday.     No current facility-administered medications on file prior to visit.    Allergies:  Allergies  Allergen Reactions  . Gabapentin Other (See Comments)    Pt states that she was unresponsive after taking this medication, but her vitals remained stable.    . Metoclopramide Hcl Anxiety and Other (See Comments)    Pt states that she felt like she was trapped in a box, and could not get out.  Pt also states that she had temporary loss of movement, weakness, and tingling.    . Tramadol Other (See Comments)    Seizures  . Ativan  [Lorazepam] Other (See Comments)    combative  . Latex Itching, Rash and Other (See Comments)    Burns  . Lyrica [Pregabalin] Other (See Comments)    suicidal  . Iron Other (See Comments)    PATIENT IS INTOLERANT TO ORAL IRON , SHE DOES NOT ABSORB IT.    Marland Kitchen Sulfa Antibiotics Hives  . Butrans [Buprenorphine] Rash    Patch caused rash, didn't help with pain  . Nucynta [Tapentadol] Nausea And Vomiting  . Propofol Nausea And Vomiting  . Tape Hives, Itching and Rash    Please use paper tape  . Toradol [Ketorolac Tromethamine] Anxiety    Vital Signs:  BP 128/82   Pulse 86   Ht 5\' 6"  (1.676 m)   Wt 156 lb (70.8 kg)   SpO2 97%   BMI 25.18 kg/m  Neurological Exam: MENTAL STATUS including orientation to time, place, person, recent and remote memory, attention span and concentration, language, and fund of knowledge is normal.  Speech is not dysarthric.  CRANIAL NERVES:  No visual field defects.  Pupils equal round and reactive to light.  Normal conjugate, extra-ocular eye movements in all directions of gaze.  No ptosis.  Face is symmetric. Palate elevates symmetrically.  Tongue is midline.  MOTOR:  Antigravity in all extremities, there is some giveway weakness in the arms.  Legs are 5/5 throughout.  No atrophy, fasciculations or abnormal movements.  No pronator drift.  Tone is normal.    MSRs:  Reflexes are 2+/4 throughout.  SENSORY:  Intact to vibration throughout.  COORDINATION/GAIT:  Normal finger-to- nose-finger.  Intact rapid alternating movements bilaterally.  Gait narrow based and stable.   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 Mild maxillary sinus disease Otherwise unremarkable MRI brain.  IMPRESSION/PLAN: 1.  Confusional spells (disorientation, delusional thoughts, hallucinations) seem to be psychiatric in nature, occurring in the setting of a stressful event.  MRI brain personally viewed and unremarkable. Recommend follow-up with  psychiatry for management  2.  Chronic migraine, stable  - Previous medications:  Topiramate,cymbalta (she takes for depression/pain), nortriptyline (pain)  - Continue Aimovig 70mg  injection every 28 days  3.   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  4.   Chronic back pain  - Patient informed that I do not manage chronic pain, she would need to establish care with spine specialist  Return to clinic in 1 year  Total time spent reviewing records, interview, history/exam, documentation, and coordination of care on day of encounter:  30 min     Thank you for allowing me to participate in patient's care.  If I can answer any additional questions, I would be pleased to do so.    Sincerely,    Chevonne Bostrom K. Allena Katz, DO

## 2021-04-13 ENCOUNTER — Encounter (INDEPENDENT_AMBULATORY_CARE_PROVIDER_SITE_OTHER): Payer: Self-pay

## 2021-04-13 ENCOUNTER — Ambulatory Visit: Payer: Medicaid Other | Admitting: Nutrition

## 2021-04-13 ENCOUNTER — Telehealth (INDEPENDENT_AMBULATORY_CARE_PROVIDER_SITE_OTHER): Payer: Self-pay

## 2021-04-13 MED ORDER — SODIUM CHLORIDE 0.9 % IV SOLN
510.0000 mg | Freq: Once | INTRAVENOUS | Status: AC
  Administered 2021-04-14: 510 mg via INTRAVENOUS
  Filled 2021-04-13: qty 510

## 2021-04-13 NOTE — Telephone Encounter (Signed)
Penny [name], CMA  

## 2021-04-14 ENCOUNTER — Encounter (HOSPITAL_COMMUNITY): Payer: Self-pay

## 2021-04-14 ENCOUNTER — Other Ambulatory Visit: Payer: Medicaid Other

## 2021-04-14 ENCOUNTER — Encounter (HOSPITAL_COMMUNITY)
Admission: RE | Admit: 2021-04-14 | Discharge: 2021-04-14 | Disposition: A | Discharge status: Home / Self Care | Payer: Medicaid Other | Source: Ambulatory Visit | Attending: Gastroenterology | Admitting: Gastroenterology

## 2021-04-14 ENCOUNTER — Other Ambulatory Visit: Payer: Self-pay | Admitting: Obstetrics and Gynecology

## 2021-04-14 ENCOUNTER — Other Ambulatory Visit (HOSPITAL_COMMUNITY)
Admission: RE | Admit: 2021-04-14 | Discharge: 2021-04-14 | Disposition: A | Discharge status: Home / Self Care | Payer: Medicaid Other | Source: Ambulatory Visit | Attending: Gastroenterology | Admitting: Gastroenterology

## 2021-04-14 ENCOUNTER — Other Ambulatory Visit: Payer: Self-pay

## 2021-04-14 DIAGNOSIS — Z87891 Personal history of nicotine dependence: Secondary | ICD-10-CM | POA: Diagnosis not present

## 2021-04-14 DIAGNOSIS — M329 Systemic lupus erythematosus, unspecified: Secondary | ICD-10-CM | POA: Diagnosis not present

## 2021-04-14 DIAGNOSIS — Z79891 Long term (current) use of opiate analgesic: Secondary | ICD-10-CM | POA: Diagnosis not present

## 2021-04-14 DIAGNOSIS — Z01812 Encounter for preprocedural laboratory examination: Secondary | ICD-10-CM | POA: Diagnosis not present

## 2021-04-14 DIAGNOSIS — M797 Fibromyalgia: Secondary | ICD-10-CM | POA: Diagnosis not present

## 2021-04-14 DIAGNOSIS — Z20822 Contact with and (suspected) exposure to covid-19: Secondary | ICD-10-CM | POA: Diagnosis not present

## 2021-04-14 DIAGNOSIS — F909 Attention-deficit hyperactivity disorder, unspecified type: Secondary | ICD-10-CM | POA: Diagnosis not present

## 2021-04-14 DIAGNOSIS — Z885 Allergy status to narcotic agent status: Secondary | ICD-10-CM | POA: Diagnosis not present

## 2021-04-14 DIAGNOSIS — D649 Anemia, unspecified: Secondary | ICD-10-CM | POA: Diagnosis not present

## 2021-04-14 DIAGNOSIS — Z884 Allergy status to anesthetic agent status: Secondary | ICD-10-CM | POA: Diagnosis not present

## 2021-04-14 DIAGNOSIS — Z91040 Latex allergy status: Secondary | ICD-10-CM | POA: Diagnosis not present

## 2021-04-14 DIAGNOSIS — F32A Depression, unspecified: Secondary | ICD-10-CM | POA: Diagnosis not present

## 2021-04-14 DIAGNOSIS — Z9884 Bariatric surgery status: Secondary | ICD-10-CM | POA: Diagnosis not present

## 2021-04-14 DIAGNOSIS — Z8711 Personal history of peptic ulcer disease: Secondary | ICD-10-CM | POA: Diagnosis not present

## 2021-04-14 DIAGNOSIS — K838 Other specified diseases of biliary tract: Secondary | ICD-10-CM | POA: Diagnosis not present

## 2021-04-14 DIAGNOSIS — Z882 Allergy status to sulfonamides status: Secondary | ICD-10-CM | POA: Diagnosis not present

## 2021-04-14 DIAGNOSIS — F419 Anxiety disorder, unspecified: Secondary | ICD-10-CM | POA: Diagnosis not present

## 2021-04-14 DIAGNOSIS — R569 Unspecified convulsions: Secondary | ICD-10-CM | POA: Diagnosis not present

## 2021-04-14 DIAGNOSIS — R1010 Upper abdominal pain, unspecified: Secondary | ICD-10-CM | POA: Diagnosis not present

## 2021-04-14 DIAGNOSIS — D509 Iron deficiency anemia, unspecified: Secondary | ICD-10-CM | POA: Diagnosis not present

## 2021-04-14 DIAGNOSIS — Z888 Allergy status to other drugs, medicaments and biological substances status: Secondary | ICD-10-CM | POA: Diagnosis not present

## 2021-04-14 DIAGNOSIS — K760 Fatty (change of) liver, not elsewhere classified: Secondary | ICD-10-CM | POA: Diagnosis not present

## 2021-04-14 DIAGNOSIS — Z09 Encounter for follow-up examination after completed treatment for conditions other than malignant neoplasm: Secondary | ICD-10-CM | POA: Diagnosis not present

## 2021-04-14 LAB — SARS CORONAVIRUS 2 (TAT 6-24 HRS): SARS Coronavirus 2: NEGATIVE

## 2021-04-14 LAB — PREGNANCY, URINE: Preg Test, Ur: NEGATIVE

## 2021-04-14 MED ORDER — SODIUM CHLORIDE 0.9 % IV SOLN
Freq: Once | INTRAVENOUS | Status: AC
  Administered 2021-04-14: 10 mL via INTRAVENOUS

## 2021-04-14 NOTE — Patient Outreach (Signed)
Care Coordination  04/14/2021  Penny [name] 1986/10/25 [id number]    Medicaid Managed Care   Unsuccessful Outreach Note  04/14/2021 Name: Penny [name] MRN: [id number] DOB: Nov 11, 1986  Referred by: Kathyrn Drown, MD Reason for referral : High Risk Managed Medicaid (Unsuccessful telephone outreach)   Third unsuccessful telephone outreach was attempted today. The patient was referred to the case management team for assistance with care management and care coordination. The patient's primary care provider has been notified of our unsuccessful attempts to make or maintain contact with the patient. The care management team is pleased to engage with this patient at any time in the future should he/she be interested in assistance from the care management team.   Follow Up Plan: We have been unable to make contact with the patient for follow up. The care management team is available to follow up with the patient after provider conversation with the patient regarding recommendation for care management engagement and subsequent re-referral to the care management team.   Aida Raider RN, BSN [place] Management Coordinator - Managed [place] High Risk [phone]

## 2021-04-15 ENCOUNTER — Ambulatory Visit (HOSPITAL_COMMUNITY): Payer: Medicaid Other | Admitting: Anesthesiology

## 2021-04-15 ENCOUNTER — Encounter (HOSPITAL_COMMUNITY): Payer: Self-pay | Admitting: Gastroenterology

## 2021-04-15 ENCOUNTER — Ambulatory Visit (HOSPITAL_COMMUNITY)
Admission: RE | Admit: 2021-04-15 | Discharge: 2021-04-15 | Disposition: A | Discharge status: Home / Self Care | Payer: Medicaid Other | Attending: Gastroenterology | Admitting: Gastroenterology

## 2021-04-15 ENCOUNTER — Other Ambulatory Visit: Payer: Self-pay

## 2021-04-15 ENCOUNTER — Encounter (HOSPITAL_COMMUNITY)
Admission: RE | Disposition: A | Discharge status: Home / Self Care | Payer: Self-pay | Source: Home / Self Care | Attending: Gastroenterology

## 2021-04-15 DIAGNOSIS — F909 Attention-deficit hyperactivity disorder, unspecified type: Secondary | ICD-10-CM | POA: Insufficient documentation

## 2021-04-15 DIAGNOSIS — Z884 Allergy status to anesthetic agent status: Secondary | ICD-10-CM | POA: Insufficient documentation

## 2021-04-15 DIAGNOSIS — R1010 Upper abdominal pain, unspecified: Secondary | ICD-10-CM | POA: Insufficient documentation

## 2021-04-15 DIAGNOSIS — K838 Other specified diseases of biliary tract: Secondary | ICD-10-CM | POA: Diagnosis not present

## 2021-04-15 DIAGNOSIS — D649 Anemia, unspecified: Secondary | ICD-10-CM | POA: Insufficient documentation

## 2021-04-15 DIAGNOSIS — Z9884 Bariatric surgery status: Secondary | ICD-10-CM | POA: Diagnosis not present

## 2021-04-15 DIAGNOSIS — K289 Gastrojejunal ulcer, unspecified as acute or chronic, without hemorrhage or perforation: Secondary | ICD-10-CM | POA: Diagnosis not present

## 2021-04-15 DIAGNOSIS — Z888 Allergy status to other drugs, medicaments and biological substances status: Secondary | ICD-10-CM | POA: Insufficient documentation

## 2021-04-15 DIAGNOSIS — Z8711 Personal history of peptic ulcer disease: Secondary | ICD-10-CM | POA: Diagnosis not present

## 2021-04-15 DIAGNOSIS — K2289 Other specified disease of esophagus: Secondary | ICD-10-CM | POA: Diagnosis not present

## 2021-04-15 DIAGNOSIS — Z87891 Personal history of nicotine dependence: Secondary | ICD-10-CM | POA: Insufficient documentation

## 2021-04-15 DIAGNOSIS — K760 Fatty (change of) liver, not elsewhere classified: Secondary | ICD-10-CM | POA: Diagnosis not present

## 2021-04-15 DIAGNOSIS — R569 Unspecified convulsions: Secondary | ICD-10-CM | POA: Diagnosis not present

## 2021-04-15 DIAGNOSIS — Z09 Encounter for follow-up examination after completed treatment for conditions other than malignant neoplasm: Secondary | ICD-10-CM | POA: Diagnosis not present

## 2021-04-15 DIAGNOSIS — F32A Depression, unspecified: Secondary | ICD-10-CM | POA: Insufficient documentation

## 2021-04-15 DIAGNOSIS — M797 Fibromyalgia: Secondary | ICD-10-CM | POA: Insufficient documentation

## 2021-04-15 DIAGNOSIS — Z79891 Long term (current) use of opiate analgesic: Secondary | ICD-10-CM | POA: Diagnosis not present

## 2021-04-15 DIAGNOSIS — Z91040 Latex allergy status: Secondary | ICD-10-CM | POA: Insufficient documentation

## 2021-04-15 DIAGNOSIS — F419 Anxiety disorder, unspecified: Secondary | ICD-10-CM | POA: Diagnosis not present

## 2021-04-15 DIAGNOSIS — Z885 Allergy status to narcotic agent status: Secondary | ICD-10-CM | POA: Insufficient documentation

## 2021-04-15 DIAGNOSIS — M329 Systemic lupus erythematosus, unspecified: Secondary | ICD-10-CM | POA: Diagnosis not present

## 2021-04-15 DIAGNOSIS — I209 Angina pectoris, unspecified: Secondary | ICD-10-CM | POA: Diagnosis not present

## 2021-04-15 DIAGNOSIS — Z882 Allergy status to sulfonamides status: Secondary | ICD-10-CM | POA: Diagnosis not present

## 2021-04-15 HISTORY — PX: BIOPSY: SHX5522

## 2021-04-15 HISTORY — PX: ESOPHAGOGASTRODUODENOSCOPY (EGD) WITH PROPOFOL: SHX5813

## 2021-04-15 SURGERY — ESOPHAGOGASTRODUODENOSCOPY (EGD) WITH PROPOFOL
Anesthesia: General

## 2021-04-15 MED ORDER — LANSOPRAZOLE 30 MG PO CPDR: 30.0000 mg | DELAYED_RELEASE_CAPSULE | Freq: Every day | ORAL | 1 refills | Status: DC

## 2021-04-15 MED ORDER — ONDANSETRON HCL 4 MG/2ML IJ SOLN
INTRAMUSCULAR | Status: AC
  Filled 2021-04-15: qty 2

## 2021-04-15 MED ORDER — LIDOCAINE HCL (PF) 2 % IJ SOLN
INTRAMUSCULAR | Status: AC
  Filled 2021-04-15: qty 5

## 2021-04-15 MED ORDER — LACTATED RINGERS IV SOLN: INTRAVENOUS | Status: DC

## 2021-04-15 MED ORDER — LIDOCAINE HCL (CARDIAC) PF 50 MG/5ML IV SOSY
PREFILLED_SYRINGE | INTRAVENOUS | Status: DC | PRN
  Administered 2021-04-15: 50 mg via INTRAVENOUS

## 2021-04-15 MED ORDER — ONDANSETRON HCL 4 MG/2ML IJ SOLN
4.0000 mg | Freq: Once | INTRAMUSCULAR | Status: AC
  Administered 2021-04-15: 4 mg via INTRAVENOUS

## 2021-04-15 MED ORDER — PROPOFOL 10 MG/ML IV BOLUS
INTRAVENOUS | Status: AC
  Filled 2021-04-15: qty 40

## 2021-04-15 MED ORDER — STERILE WATER FOR IRRIGATION IR SOLN
Status: DC | PRN
  Administered 2021-04-15: 100 mL

## 2021-04-15 MED ORDER — PROPOFOL 10 MG/ML IV BOLUS
INTRAVENOUS | Status: DC | PRN
  Administered 2021-04-15: 100 mg via INTRAVENOUS
  Administered 2021-04-15: 200 mg via INTRAVENOUS
  Administered 2021-04-15: 130 mg via INTRAVENOUS

## 2021-04-15 MED ORDER — PROPOFOL 10 MG/ML IV BOLUS
INTRAVENOUS | Status: AC
  Filled 2021-04-15: qty 60

## 2021-04-15 NOTE — Discharge Instructions (Signed)
You are being discharged to home.  Resume your previous diet.  We are waiting for your pathology results.  Decrease Prevacid to 30 mg daily indefinitely. Continue working on smoking cessation.   Upper Endoscopy, Adult, Care After This sheet gives you information about how to care for yourself after your procedure. Your health care provider may also give you more specific instructions. If you have problems or questions, contact your health care provider. What can I expect after the procedure? After the procedure, it is common to have:  A sore throat.  Mild stomach pain or discomfort.  Bloating.  Nausea. Follow these instructions at home:  Follow instructions from your health care provider about what to eat or drink after your procedure.  Return to your normal activities as told by your health care provider. Ask your health care provider what activities are safe for you.  Take over-the-counter and prescription medicines only as told by your health care provider.  If you were given a sedative during the procedure, it can affect you for several hours. Do not drive or operate machinery until your health care provider says that it is safe.  Keep all follow-up visits as told by your health care provider. This is important.   Contact a health care provider if you have:  A sore throat that lasts longer than one day.  Trouble swallowing. Get help right away if:  You vomit blood or your vomit looks like coffee grounds.  You have: ? A fever. ? Bloody, black, or tarry stools. ? A severe sore throat or you cannot swallow. ? Difficulty breathing. ? Severe pain in your chest or abdomen. Summary  After the procedure, it is common to have a sore throat, mild stomach discomfort, bloating, and nausea.  If you were given a sedative during the procedure, it can affect you for several hours. Do not drive or operate machinery until your health care provider says that it is safe.  Follow  instructions from your health care provider about what to eat or drink after your procedure.  Return to your normal activities as told by your health care provider. This information is not intended to replace advice given to you by your health care provider. Make sure you discuss any questions you have with your health care provider. Document Revised: 11/20/2019 Document Reviewed: 04/24/2018 Elsevier Patient Education  2021 [employer].

## 2021-04-15 NOTE — Anesthesia Postprocedure Evaluation (Signed)
Anesthesia Post Note  [name]  Procedure(s) Performed: ESOPHAGOGASTRODUODENOSCOPY (EGD) WITH PROPOFOL (N/A ) BIOPSY  Patient location during evaluation: Endoscopy Anesthesia Type: General Level of consciousness: awake and alert and oriented Pain management: pain level controlled Vital Signs Assessment: post-procedure vital signs reviewed and stable Respiratory status: spontaneous breathing and respiratory function stable Cardiovascular status: blood pressure returned to baseline and stable Postop Assessment: no apparent nausea or vomiting Anesthetic complications: no   No complications documented.   Last Vitals:  Vitals:   04/15/21 0749 04/15/21 0838  BP: 113/76 (!) 91/59  Pulse: 79   Resp: 17 (!) 21  Temp: 36.5 C 36.6 C  SpO2:  97%    Last Pain:  Vitals:   04/15/21 0838  TempSrc: Oral  PainSc:                  [name]

## 2021-04-15 NOTE — Anesthesia Procedure Notes (Signed)
Date/Time: 04/15/2021 8:16 AM Performed by: Vista Deck, CRNA Pre-anesthesia Checklist: Patient identified, Emergency Drugs available, Suction available, Timeout performed and Patient being monitored Patient Re-evaluated:Patient Re-evaluated prior to induction Oxygen Delivery Method: Nasal Cannula

## 2021-04-15 NOTE — Transfer of Care (Signed)
Immediate Anesthesia Transfer of Care Note  [name]  Procedure(s) Performed: ESOPHAGOGASTRODUODENOSCOPY (EGD) WITH PROPOFOL (N/A ) BIOPSY  Patient Location: Endoscopy Unit  Anesthesia Type:General  Level of Consciousness: awake and patient cooperative  Airway & Oxygen Therapy: Patient Spontanous Breathing  Post-op Assessment: Report given to RN and Post -op Vital signs reviewed and stable  Post vital signs: Reviewed and stable  Last Vitals:  Vitals Value Taken Time  BP    Temp 97.8   Pulse    Resp    SpO2      Last Pain:  Vitals:   04/15/21 0818  TempSrc:   PainSc: 8          Complications: No complications documented.

## 2021-04-15 NOTE — Anesthesia Preprocedure Evaluation (Addendum)
Anesthesia Evaluation  Patient identified by MRN, date of birth, ID band Patient awake    Reviewed: Allergy & Precautions, NPO status , Patient's Chart, lab work & pertinent test results  History of Anesthesia Complications (+) PONV, Family history of anesthesia reaction and history of anesthetic complications  Airway Mallampati: II  TM Distance: >3 FB Neck ROM: Full    Dental  (+) Dental Advisory Given, Teeth Intact   Pulmonary shortness of breath, asthma , former smoker,    Pulmonary exam normal breath sounds clear to auscultation       Cardiovascular hypertension, Pt. on medications + angina Normal cardiovascular exam+ dysrhythmias + Valvular Problems/Murmurs  Rhythm:Regular Rate:Normal     Neuro/Psych  Headaches, Seizures - (pseudoseizures), Poorly Controlled,  PSYCHIATRIC DISORDERS Anxiety Depression  Neuromuscular disease    GI/Hepatic GERD  Medicated and Controlled,(+)     substance abuse  , H/o gastric bypass   Endo/Other  negative endocrine ROS  Renal/GU negative Renal ROS     Musculoskeletal  (+) Fibromyalgia -, narcotic dependent  Abdominal   Peds  (+) ATTENTION DEFICIT DISORDER WITHOUT HYPERACTIVITY Hematology  (+) anemia ,   Anesthesia Other Findings   Reproductive/Obstetrics                            Anesthesia Physical Anesthesia Plan  ASA: III  Anesthesia Plan: General   Post-op Pain Management:    Induction: Intravenous  PONV Risk Score and Plan: Propofol infusion  Airway Management Planned: Nasal Cannula and Natural Airway  Additional Equipment:   Intra-op Plan:   Post-operative Plan:   Informed Consent: I have reviewed the patients History and Physical, chart, labs and discussed the procedure including the risks, benefits and alternatives for the proposed anesthesia with the patient or authorized representative who has indicated his/her understanding  and acceptance.     Dental advisory given  Plan Discussed with: CRNA and Surgeon  Anesthesia Plan Comments:         Anesthesia Quick Evaluation

## 2021-04-15 NOTE — Interval H&P Note (Signed)
History and Physical Interval Note:  04/15/2021 8:14 AM Penny [name] is a 35 y.o. 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  The patient reports that she still presenting pain in her right upper quadrant.  He stated overall the use of Librax has led to improvement of her symptoms as she is not vomiting as often as in the past and has been able to tolerate diet more.  Not losing weight at the moment.  BP 113/76   Pulse 79   Temp 97.7 F (36.5 C) (Oral)   Resp 17  GENERAL: The patient is AO x3, in no acute distress. HEENT: Head is normocephalic and atraumatic. EOMI are intact. Mouth is well hydrated and without lesions. NECK: Supple. No masses LUNGS: Clear to auscultation. No presence of rhonchi/wheezing/rales. Adequate chest expansion HEART: RRR, normal s1 and s2. ABDOMEN: tender to palpation in the right upper quadrant and epigastric area, no guarding, no peritoneal signs, and nondistended. BS +. No masses. EXTREMITIES: Without any cyanosis, clubbing, rash, lesions or edema. NEUROLOGIC: AOx3, no focal motor deficit. SKIN: no jaundice, no rashes  Penny [name]  has presented today for surgery, with the diagnosis of Gastrojejunal Ulcer.  The various methods of treatment have been discussed with the patient and family. After consideration of risks, benefits and other options for treatment, the patient has consented to  Procedure(s) with comments: ESOPHAGOGASTRODUODENOSCOPY (EGD) WITH PROPOFOL (N/A) - 9:00 AM as a surgical intervention.  The patient's history has been reviewed, patient examined, no change in status, stable for surgery.  I have reviewed the patient's chart and labs.  Questions were answered to the patient's satisfaction.      Penny [name]

## 2021-04-15 NOTE — Op Note (Signed)
[facility] Patient Name: Penny [name] Procedure Date: 04/15/2021 8:09 AM MRN: [id number] Date of Birth: 1986-07-09 Attending MD: [name] ,  CSN: [id number] Age: 35 Admit Type: Outpatient Procedure:                Upper GI endoscopy Indications:              Follow-up of gastrojejunal ulcer Providers:                [name], Lambert Mody, Kristine L.                            Risa Grill, Technician Referring MD:              Medicines:                Monitored Anesthesia Care Complications:            No immediate complications. Estimated Blood Loss:     Estimated blood loss: none. Procedure:                Pre-Anesthesia Assessment:                           - Prior to the procedure, a History and Physical                            was performed, and patient medications, allergies                            and sensitivities were reviewed. The patient's                            tolerance of previous anesthesia was reviewed.                           - The risks and benefits of the procedure and the                            sedation options and risks were discussed with the                            patient. All questions were answered and informed                            consent was obtained.                           - ASA Grade Assessment: III - A patient with severe                            systemic disease.                           After obtaining informed consent, the endoscope was                            passed under direct vision. Throughout the  procedure, the patient's blood pressure, pulse, and                            oxygen saturations were monitored continuously. The                            GIF-H190 ([id number]) scope was introduced through the                            mouth, and advanced to the jejunum. The upper GI                            endoscopy was accomplished without difficulty. The                             patient tolerated the procedure well. Scope In: 8:22:57 AM Scope Out: 8:32:21 AM Total Procedure Duration: 0 hours 9 minutes 24 seconds  Findings:      Diffuse mucosal changes characterized by paper mache appearance were       found in the middle third of the esophagus and in the lower third of the       esophagus. Biopsies were taken with a cold forceps for histology.      Evidence of a gastric bypass was found. A gastric pouch with a 5 cm       length from the GE junction to the gastrojejunal anastomosis was found.       The staple line appeared intact. The gastrojejunal anastomosis was       characterized by healthy appearing mucosa. There was no presence of any       ulceraton. This was traversed. The pouch-to-jejunum limb was       characterized by healthy appearing mucosa. The jejunojejunal anastomosis       was characterized by healthy appearing mucosa.      The examined jejunum was normal. 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                            Biopsied.                           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 Gastrojejunal anastomosis                            characterized by healthy appearing mucosa.                           - Normal examined jejunum. Moderate Sedation:      Per Anesthesia Care Recommendation:           - Discharge patient to home (ambulatory).                           -  Resume previous diet.                           - Await pathology results.                           - Decrease Prevacid to 30 mg daily indefinitely.                           - Continue working on smoking cessation. 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 All rights reserved. The codes documented in this report are preliminary and upon coder review may  be revised to meet current compliance requirements. [name], MD [name],  04/15/2021 8:48:55 AM This report has been signed electronically. Number of Addenda: 0

## 2021-04-16 ENCOUNTER — Encounter (INDEPENDENT_AMBULATORY_CARE_PROVIDER_SITE_OTHER): Payer: Self-pay

## 2021-04-16 LAB — SURGICAL PATHOLOGY

## 2021-04-17 ENCOUNTER — Ambulatory Visit: Payer: Medicaid Other | Admitting: Neurology

## 2021-04-20 DIAGNOSIS — Z79899 Other long term (current) drug therapy: Secondary | ICD-10-CM | POA: Diagnosis not present

## 2021-04-20 DIAGNOSIS — M546 Pain in thoracic spine: Secondary | ICD-10-CM | POA: Diagnosis not present

## 2021-04-21 ENCOUNTER — Encounter (HOSPITAL_COMMUNITY): Payer: Self-pay | Admitting: Gastroenterology

## 2021-04-23 ENCOUNTER — Other Ambulatory Visit (INDEPENDENT_AMBULATORY_CARE_PROVIDER_SITE_OTHER): Payer: Self-pay | Admitting: Gastroenterology

## 2021-04-23 ENCOUNTER — Encounter (INDEPENDENT_AMBULATORY_CARE_PROVIDER_SITE_OTHER): Payer: Self-pay

## 2021-04-23 DIAGNOSIS — K200 Eosinophilic esophagitis: Secondary | ICD-10-CM

## 2021-04-23 MED ORDER — FLUTICASONE PROPIONATE HFA 220 MCG/ACT IN AERO: 2.0000 | INHALATION_SPRAY | Freq: Two times a day (BID) | RESPIRATORY_TRACT | 2 refills | Status: DC

## 2021-04-30 ENCOUNTER — Other Ambulatory Visit: Payer: Self-pay

## 2021-04-30 ENCOUNTER — Telehealth (HOSPITAL_COMMUNITY): Payer: Self-pay | Admitting: Clinical

## 2021-04-30 ENCOUNTER — Ambulatory Visit (HOSPITAL_COMMUNITY): Payer: Medicaid Other | Admitting: Clinical

## 2021-04-30 NOTE — Telephone Encounter (Signed)
The patient did not repond to attempts to contact and missed her scheduled appointment

## 2021-05-07 ENCOUNTER — Telehealth: Payer: Medicaid Other | Admitting: Physician Assistant

## 2021-05-07 ENCOUNTER — Ambulatory Visit (INDEPENDENT_AMBULATORY_CARE_PROVIDER_SITE_OTHER): Payer: Medicaid Other

## 2021-05-07 ENCOUNTER — Encounter: Payer: Self-pay | Admitting: Emergency Medicine

## 2021-05-07 ENCOUNTER — Ambulatory Visit
Admission: EM | Admit: 2021-05-07 | Discharge: 2021-05-07 | Disposition: A | Discharge status: Home / Self Care | Payer: Medicaid Other | Attending: Internal Medicine | Admitting: Internal Medicine

## 2021-05-07 ENCOUNTER — Encounter: Payer: Self-pay | Admitting: Physician Assistant

## 2021-05-07 DIAGNOSIS — J069 Acute upper respiratory infection, unspecified: Secondary | ICD-10-CM | POA: Diagnosis not present

## 2021-05-07 DIAGNOSIS — R509 Fever, unspecified: Secondary | ICD-10-CM | POA: Diagnosis not present

## 2021-05-07 DIAGNOSIS — R61 Generalized hyperhidrosis: Secondary | ICD-10-CM

## 2021-05-07 DIAGNOSIS — R42 Dizziness and giddiness: Secondary | ICD-10-CM | POA: Diagnosis not present

## 2021-05-07 DIAGNOSIS — R059 Cough, unspecified: Secondary | ICD-10-CM

## 2021-05-07 DIAGNOSIS — R1110 Vomiting, unspecified: Secondary | ICD-10-CM | POA: Diagnosis not present

## 2021-05-07 MED ORDER — MECLIZINE HCL 25 MG PO TABS: 25.0000 mg | ORAL_TABLET | Freq: Three times a day (TID) | ORAL | 0 refills | Status: DC | PRN

## 2021-05-07 NOTE — ED Provider Notes (Signed)
[facility]    CSN: [id number] Arrival date & time: 05/07/21  1908      History   Chief Complaint No chief complaint on file.   HPI Penny [name] is a 35 y.o. female who presents with onset of fever one week ago and since then off and on up to 100.7. Onset of cough in the middle of the night 6 days ago. Has chronic reflux and takes meds and FU's with GI closely.  Has been fatigued, has been sweating as well. No UTI symptoms.  Last night felt a little off balance. Admits being under a lot of stress.  Had telehealth visit this am and was prescribed Meclezine.  Had body aches initially, but gone now    Past Medical History:  Diagnosis Date  . ADD (attention deficit disorder)   . Anemia 2011   2o to GASTRIC BYPASS  . Anginal pain ([place])   . Anxiety   . Asthma   . Blood transfusion without reported diagnosis   . Chronic daily headache   . Depression   . Depression   . Dysmenorrhea 09/18/2013  . Dysrhythmia   . Elevated liver enzymes JUL 2011ALK PHOS 111-127 AST  143-267 ALT  213-321T BILI 0.6  ALB  3.7-4.06 Jun 2011 ALK PHOS 118 AST 24 ALT 42 T BILI 0.4 ALB 3.9  . Encounter for drug rehabilitation    at behavioral health for opioid addiction about 4 years ago  . Family history of adverse reaction to anesthesia    'dad had to be kept on pump for breathing for morphine'  . Fatty liver   . Fibroid 01/18/2017  . Fibromyalgia   . Gastric bypass status for obesity   . Gastritis JULY 2011  . Heart murmur   . 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 History of Holter monitoring   . Hx of opioid abuse ([place])    for about 4 years, about 4 years ago  . Interstitial cystitis   . Iron deficiency anemia 07/23/2010  . Irritable bowel syndrome 2012 DIARRHEA   JUN 2012 TTG IgA 14.9  . IUD FEB 2010  . Lupus ([place])   . Menorrhagia 07/18/2013  . Migraines   . Obesity (BMI 30-39.9) 2011 228 LBS BMI 36.8  . Osteoporosis   . Ovarian cyst   . Patient  desires pregnancy 09/18/2013  . Polyneuropathy   . PONV (postoperative nausea and vomiting) seizure post-operatively   seizure following ankle surgery  . Potassium (K) deficiency   . Pregnant 12/25/2013  . Psychiatric pseudoseizure   . RLQ abdominal pain 07/18/2013  . Sciatica of left side 11/14/2014  . 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 Stress 09/18/2013    Patient Active Problem List   Diagnosis Date Noted  . Chronic prescription opiate use 12/25/2020  . 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 Constipation 12/25/2020  . Major depressive disorder, recurrent episode ([place]) 06/22/2020  . Major depression 06/19/2020  . Dehydration   . Chronic abdominal pain 09/19/2019  . Gastroesophageal reflux disease without esophagitis   . Cannabis abuse 08/30/2019  . Dyspnea   . Severe anxiety 08/21/2019  . Absolute anemia 04/23/2019  . Urge incontinence 10/26/2018  . S/P kyphoplasty 08/02/2018  . Herpes genitalis in women 12/19/2017  . Cannabis use disorder, moderate, dependence ([place]) 07/07/2017  . Abnormal urine odor 03/30/2017  . Urinary tract infection with hematuria 03/30/2017  . Screening for genitourinary condition 03/30/2017  . Pain 03/30/2017  .  Burning with urination 03/30/2017  . Family history of breast cancer in mother,uncertain BR CA status 02/28/2017  . Fibroid 01/18/2017  . Chronic pain syndrome 12/05/2016  . Folate deficiency 12/04/2016  . Anastomotic ulcer 04/22/2016  . Pain medication agreement signed 04/22/2016  . Complaints of leg weakness   . Paresthesia   . Left-sided weakness   . Uncontrolled pain 01/24/2016  . Malnutrition of moderate degree 01/24/2016  . Neuropathy 01/23/2016  . Inability to walk 01/23/2016  . Paresthesias   . Bilateral leg numbness 11/26/2015  . Major depressive disorder, recurrent episode, 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[place]) 05/10/2015  . Post partum depression 05/09/2015  . Hematemesis 04/07/2015  . Intractable vomiting  with nausea 04/07/2015  . Elevated liver enzymes   . Epigastric pain   . 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 Seizure-like activity ([place]) 02/22/2015  . Fibromyalgia 02/18/2015  . Vulvar fissure 02/18/2015  . Clinical depression 02/01/2015  . Current smoker 01/29/2015  . Current tobacco use 01/29/2015  . 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 Leg weakness, bilateral 01/02/2015  . Gait disorder 01/02/2015  . Other symptoms and signs involving the musculoskeletal system 01/02/2015  . Abnormal gait 01/02/2015  . Cervicalgia 12/18/2014  . Sciatica of left side 11/14/2014  . Neuralgia neuritis, sciatic nerve 11/14/2014  . Pseudoseizures ([place]) 09/12/2014  . Encounter for sterilization 09/09/2014  . Abdominal pain, acute, right lower quadrant 08/22/2014  . Headache, migraine 08/19/2014  . Seizure ([place]) 08/18/2014  . Encephalopathy 08/13/2014  . Headache 08/13/2014  . Altered mental status 08/13/2014  . Right leg numbness 07/31/2014  . Disturbance of skin sensation 07/31/2014  . Hypertension in pregnancy, transient 07/08/2014  . Previous gastric bypass affecting pregnancy, antepartum 05/22/2014  . History of bariatric surgery 05/22/2014  . 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 Antepartum mental disorder in pregnancy 05/09/2014  . Rapid palpitations 02/18/2014  . H/O maternal third degree perineal laceration, currently pregnant 02/18/2014  . High-risk pregnancy 02/18/2014  . Supervision of pregnancy with other poor reproductive or obstetric history, unspecified trimester 02/18/2014  . Restless legs 01/16/2014  . Restless leg 01/16/2014  . Chronic interstitial cystitis 10/23/2013  . Menorrhagia 07/18/2013  . Excessive and frequent menstruation 07/18/2013  . h/o Opiate addiction 03/11/2012    Class: Acute  . History of migraine headaches 03/10/2012    Class: Acute  . Depression with anxiety  03/10/2012    Class: Chronic  . 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 Attention deficit hyperactivity disorder, 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    Class: Chronic  . Panic disorder without agoraphobia 03/10/2012  . H/O disease 03/10/2012  . Dysthymia 03/10/2012  . 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 Other specified behavioral and emotional disorders with onset usually occurring in childhood and adolescence 03/10/2012  . Pelvic congestion syndrome 10/13/2011  . Coitalgia 10/13/2011  . Chronic migraine without aura 10/13/2011  . Unspecified dyspareunia 10/13/2011  . Other specified conditions associated with female genital organs and menstrual cycle 10/13/2011  . IBS (irritable bowel syndrome) 08/25/2011  . OBESITY, UNSPECIFIED 09/17/2010  . Iron deficiency anemia 07/23/2010    Past Surgical History:  Procedure Laterality Date  . BIOPSY  04/10/2015   Procedure: BIOPSY;  Surgeon: Daneil Dolin, MD;  Location: [facility];  Service: Endoscopy;;  . BIOPSY  04/15/2021   Procedure: BIOPSY;  Surgeon: Harvel Quale, MD;  [facility];  Service: Gastroenterology;;  . cath self every nite     for sodium bicarb injection (discontinued 2013)  . CHOLECYSTECTOMY  2005   biliary dyskinesia  . COLONOSCOPY  JUN 2012 ABD PN/DIARRHEA WITH PROPOFOL   NL COLON  . COLONOSCOPY WITH PROPOFOL N/A 01/16/2021   Procedure: COLONOSCOPY WITH PROPOFOL;  Surgeon: Harvel Quale, MD;  [facility];  Service: Gastroenterology;  Laterality: N/A;  1:45  . DILATION AND CURETTAGE OF UTERUS    . DILITATION & CURRETTAGE/HYSTROSCOPY WITH NOVASURE ABLATION N/A 03/24/2017   Procedure: DILATATION & CURETTAGE/HYSTEROSCOPY WITH NOVASURE ENDOMETRIAL ABLATION;  Surgeon: Jonnie Kind, MD;  Location: [facility];  Service: Gynecology;  Laterality: N/A;  . ESOPHAGEAL DILATION N/A 04/10/2015   Procedure:  ESOPHAGEAL DILATION WITH 54FR MALONEY DILATOR;  Surgeon: Daneil Dolin, MD;  Location: [facility];  Service: Endoscopy;  Laterality: N/A;  . ESOPHAGOGASTRODUODENOSCOPY    . ESOPHAGOGASTRODUODENOSCOPY (EGD) WITH PROPOFOL N/A 04/10/2015   Procedure: ESOPHAGOGASTRODUODENOSCOPY (EGD) WITH PROPOFOL;  Surgeon: Daneil Dolin, MD;  [facility]: [facility];  Service: Endoscopy;  Laterality: N/A;  . ESOPHAGOGASTRODUODENOSCOPY (EGD) WITH PROPOFOL N/A 12/06/2016   Procedure: ESOPHAGOGASTRODUODENOSCOPY (EGD) WITH PROPOFOL;  Surgeon: Danie Binder, MD;  [facility];  Service: Endoscopy;  Laterality: N/A;  . ESOPHAGOGASTRODUODENOSCOPY (EGD) WITH PROPOFOL N/A 04/27/2019   Procedure: ESOPHAGOGASTRODUODENOSCOPY (EGD) WITH PROPOFOL;  Surgeon: Rogene Houston, MD;  [facility];  Service: Endoscopy;  Laterality: N/A;  730  . ESOPHAGOGASTRODUODENOSCOPY (EGD) WITH PROPOFOL N/A 08/31/2019   Procedure: ESOPHAGOGASTRODUODENOSCOPY (EGD) WITH PROPOFOL;  Surgeon: Rogene Houston, MD;  [facility];  Service: Endoscopy;  Laterality: N/A;  . ESOPHAGOGASTRODUODENOSCOPY (EGD) WITH PROPOFOL N/A 01/16/2021   Procedure: ESOPHAGOGASTRODUODENOSCOPY (EGD) WITH PROPOFOL;  Surgeon: Harvel Quale, MD;  [facility];  Service: Gastroenterology;  Laterality: N/A;  . ESOPHAGOGASTRODUODENOSCOPY (EGD) WITH PROPOFOL N/A 04/15/2021   Procedure: ESOPHAGOGASTRODUODENOSCOPY (EGD) WITH PROPOFOL;  Surgeon: Harvel Quale, MD;  [facility];  Service: Gastroenterology;  Laterality: N/A;  9:00 AM  . GAB  2007   in [place]-POUCH 5 CM  . GASTRIC BYPASS  06/2006  . HYSTEROSCOPY WITH D & C N/A 09/12/2014   Procedure: DILATATION AND CURETTAGE /HYSTEROSCOPY;  Surgeon: Jonnie Kind, MD;  [facility];  Service: Gynecology;  Laterality: N/A;  . KYPHOPLASTY N/A 08/02/2018   Procedure: KYPHOPLASTY T12;  Surgeon: Melina Schools, MD;  Location: [place];  Service: Orthopedics;  Laterality:  N/A;  60 mins  . LAPAROSCOPIC TUBAL LIGATION Bilateral 03/24/2017   Procedure: LAPAROSCOPIC TUBAL LIGATION ([place] Rings);  Surgeon: Jonnie Kind, MD;  Location: [facility];  Service: Gynecology;  Laterality: Bilateral;  . REPAIR VAGINAL CUFF N/A 07/30/2014   Procedure: REPAIR VAGINAL CUFF;  Surgeon: Mora Bellman, MD;  Location: [facility];  Service: Gynecology;  Laterality: N/A;  . SAVORY DILATION  06/20/2012   Dr. Barnie Alderman gastritis/Ulcer in the mid jejunum. Empiric dilation.   . SURAL NERVE BX Left 02/25/2016   Procedure: LEFT SURAL NERVE BIOPSY;  Surgeon: Jovita Gamma, MD;  Location: [place] [facility];  Service: Neurosurgery;  Laterality: Left;  Left sural nerve biopsy  . TONSILLECTOMY    . TONSILLECTOMY AND ADENOIDECTOMY    . UPPER GASTROINTESTINAL ENDOSCOPY  JULY 2011 NAUSEA-D125,V6, PH 25   Bx; GASTRITIS, POUCH-5 CM LONG  . WISDOM TOOTH EXTRACTION      OB History    Gravida  3   Para  2  Term  1   Preterm  1   AB  1   Living  2     SAB  1   IAB      Ectopic      Multiple      Live Births  2            Home Medications    Prior to Admission medications   Medication Sig Start Date End Date Taking? Authorizing Provider  amphetamine-dextroamphetamine (ADDERALL XR) 30 MG 24 hr capsule Take 1 capsule (30 mg total) by mouth every morning. 03/12/21   Cloria Spring, MD  Buprenorphine HCl 900 MCG FILM Place 900 mcg inside cheek 2 (two) times daily.    [provider]  busPIRone (BUSPAR) 10 MG tablet Take 1 tablet (10 mg total) by mouth 3 (three) times daily. Patient taking differently: Take 10 mg by mouth 3 (three) times daily as needed (anxiety). As needed per patient. 03/12/21   Cloria Spring, MD  Calcium Carb-Cholecalciferol (CALCIUM-VITAMIN D3) 600-500 MG-UNIT CAPS Take 1 tablet by mouth daily.    [provider]  Carboxymethylcellul-Glycerin (LUBRICATING EYE DROPS OP) Place 1 drop into both eyes daily as needed (dry eyes).    [provider]  cetirizine (ZYRTEC) 10 MG tablet TAKE ONE TABLET BY MOUTH DAILY Patient taking differently: Take 10 mg by mouth daily. 03/17/21   Valentina Shaggy, MD  clidinium-chlordiazePOXIDE (LIBRAX) 5-2.5 MG capsule Take 1 capsule by mouth 3 (three) times daily before meals. 03/17/21   Harvel Quale, MD  clonazePAM (KLONOPIN) 0.5 MG tablet Take 0.5 mg by mouth 2 (two) times daily as needed. 04/06/21   [provider]  cyclobenzaprine (FLEXERIL) 10 MG tablet Take 1 tablet (10 mg total) by mouth 3 (three) times daily as needed for muscle spasms. 03/15/21   Konrad Felix, PA-C  DULoxetine (CYMBALTA) 60 MG capsule Take 1 capsule (60 mg total) by mouth 2 (two) times daily. 03/12/21   Cloria Spring, MD  fluconazole (DIFLUCAN) 150 MG tablet One po qd prn yeast infection; may repeat in 3-4 days if needed Patient taking differently: Take 150 mg by mouth See admin instructions. One po qd prn yeast infection; may repeat in 3-4 days if needed 01/29/21   Kathyrn Drown, MD  fluticasone (FLOVENT HFA) 220 MCG/ACT inhaler Inhale 2 puffs into the lungs 2 (two) times daily. Do not inhale, swallow powder and drink water , do not eat food for 20 minutes after puffs 04/23/21   Montez Morita, Quillian Quince, MD  HYDROmorphone (DILAUDID) 4 MG tablet Take 4 mg by mouth every 4 (four) hours as needed for severe pain.    [provider]  lansoprazole (PREVACID) 30 MG capsule Take 1 capsule (30 mg total) by mouth daily. 04/15/21   Harvel Quale, MD  levalbuterol [facility] HFA) 45 MCG/ACT inhaler INHALE (2) PUFFS INTO THE LUNGS EVERY SIX HOURS AS NEEDED FOR WHEEZING. Patient taking differently: Inhale 2 puffs into the lungs every 6 (six) hours as needed for wheezing or shortness of breath. INHALE (2) PUFFS INTO THE LUNGS EVERY SIX HOURS AS NEEDED FOR WHEEZING. 02/24/21   Valentina Shaggy, MD  levalbuterol [facility]) 1.25 MG/3ML nebulizer solution Take 1.25 mg by nebulization every 4 (four) hours  as needed for wheezing. 02/24/21   Valentina Shaggy, MD  linaclotide [facility]) 290 MCG CAPS capsule Take 1 capsule (290 mcg total) by mouth daily before breakfast. 03/26/21   Harvel Quale, MD  meclizine (  ANTIVERT) 25 MG tablet Take 1 tablet (25 mg total) by mouth 3 (three) times daily as needed for dizziness. 05/07/21   Mar Daring, PA-C  naloxegol oxalate (MOVANTIK) 12.5 MG TABS tablet Take 2 tablets (25 mg total) by mouth daily. 03/26/21   Harvel Quale, MD  ondansetron (ZOFRAN) 8 MG tablet Take 1 tablet (8 mg total) by mouth every 8 (eight) hours as needed for nausea. 01/07/21   Kathyrn Drown, MD  Pediatric Multivit-Minerals-C (FLINTSTONES GUMMIES) chewable tablet Chew 3 tablets by mouth daily.    [provider]  promethazine (PHENERGAN) 12.5 MG tablet TAKE 1 TABLET BY MOUTH EVERY 8 HOURS AS NEEDED FOR NAUSEA OR VOMITING Patient taking differently: Take 12.5 mg by mouth every 8 (eight) hours as needed for nausea or vomiting. 03/17/21   Montez Morita, Quillian Quince, MD  promethazine (PHENERGAN) 25 MG tablet Take 25 mg by mouth 2 (two) times daily as needed for nausea or vomiting.    [provider]  rOPINIRole (REQUIP) 1 MG tablet Take one tablet po each evening. Patient taking differently: Take 1 mg by mouth at bedtime. 10/27/20   Kathyrn Drown, MD  sucralfate (CARAFATE) 1 g tablet Take 1 tablet (1 g total) by mouth 4 (four) times daily -  with meals and at bedtime. 03/26/21 04/25/21  Harvel Quale, MD  traZODone (DESYREL) 100 MG tablet Take 2 tablets (200 mg total) by mouth at bedtime. 03/12/21   Cloria Spring, MD  valACYclovir (VALTREX) 1000 MG tablet TAKE 1 TABLET BY MOUTH DAILY. Patient taking differently: Take 1,000 mg by mouth daily as needed (fever blisters). 08/08/20   Estill Dooms, NP  Vitamin D, Ergocalciferol, (DRISDOL) 1.25 MG (50000 UNIT) CAPS capsule Take 50,000 Units by mouth every Wednesday.    [provider]    Family History Family History  Problem Relation Age of Onset  . Hemochromatosis Maternal Grandmother   . Migraines Maternal Grandmother   . Cancer Maternal Grandmother   . Breast cancer Maternal Grandmother   . Hypertension Father   . Diabetes Father   . Coronary artery disease Father   . Migraines Paternal Grandmother   . Breast cancer Paternal Grandmother   . Cancer Mother        breast  . Hemochromatosis Mother   . Breast cancer Mother   . Depression Mother   . Anxiety disorder Mother   . Coronary artery disease Paternal Grandfather   . Anxiety disorder Brother   . Bipolar disorder Brother   . Healthy Daughter   . Healthy Son     Social History Social History   Tobacco Use  . Smoking status: Former Smoker    Packs/day: 0.25    Years: 6.00    Pack years: 1.50    Types: Cigarettes    Start date: 05/2018  . Smokeless tobacco: Never Used  Vaping Use  . Vaping Use: Some days  Substance Use Topics  . Alcohol use: Not Currently    Alcohol/week: 0.0 standard drinks  . Drug use: Not Currently     Allergies   Gabapentin, Metoclopramide hcl, Tramadol, Ativan [lorazepam], Latex, Lyrica [pregabalin], Iron, Sulfa antibiotics, Butrans [buprenorphine], Nucynta [tapentadol], Propofol, Tape, and Toradol [ketorolac tromethamine]   Review of Systems Review of Systems + chronic GI probles, recent fever, GERD, cough, dizziness, R ear pain,  myalgias and fatigue. The rest is neg  Physical Exam Triage Vital Signs ED Triage Vitals  Enc Vitals Group  BP 05/07/21 1910 122/85     Pulse Rate 05/07/21 1910 (!) 115     Resp 05/07/21 1910 16     Temp 05/07/21 1910 98.3 F (36.8 C)     Temp Source 05/07/21 1910 Temporal     SpO2 05/07/21 1910 94 %     Weight --      Height --      Head Circumference --      Peak Flow --      Pain Score 05/07/21 1912 6     Pain Loc --      Pain Edu? --      Excl. in [place]? --    No data found.  Updated Vital Signs BP 122/85 (BP  Location: Right Arm)   Pulse (!) 115   Temp 98.3 F (36.8 C) (Temporal)   Resp 16   SpO2 94%   Visual Acuity Right Eye Distance:   Left Eye Distance:   Bilateral Distance:    Right Eye Near:   Left Eye Near:    Bilateral Near:     Physical Exam Vitals and nursing note reviewed.  Constitutional:      General: She is not in acute distress.    Appearance: She is not toxic-appearing.  HENT:     Head: Normocephalic.     Right Ear: Ear canal and external ear normal.     Left Ear: Tympanic membrane, ear canal and external ear normal.     Ears:     Comments: R TM is flat, gray and dull    Nose: Nose normal.  Eyes:     Conjunctiva/sclera: Conjunctivae normal.  Cardiovascular:     Rate and Rhythm: Regular rhythm. Tachycardia present.     Heart sounds: No murmur heard.     Comments: Rate 102 Pulmonary:     Effort: Pulmonary effort is normal.     Breath sounds: Normal breath sounds. No wheezing, rhonchi or rales.  Musculoskeletal:        General: Normal range of motion.     Cervical back: Neck supple.  Skin:    General: Skin is warm and dry.     Findings: No rash.  Neurological:     Mental Status: She is alert and oriented to person, place, and time.     Gait: Gait normal.  Psychiatric:        Mood and Affect: Mood normal.        Behavior: Behavior normal.        Thought Content: Thought content normal.        Judgment: Judgment normal.      UC Treatments / Results  Labs (all labs ordered are listed, but only abnormal results are displayed) Labs Reviewed  COVID-19, FLU A+B NAA    EKG   Radiology DG Chest 2 View  Result Date: 05/07/2021 CLINICAL DATA:  Intermittent fever for 1 week, diaphoresis, vomiting, dizziness EXAM: CHEST - 2 VIEW COMPARISON:  08/20/2020 FINDINGS: Frontal and lateral views of the chest demonstrate an unremarkable cardiac silhouette. No acute airspace disease, effusion, or pneumothorax. Small residual nodular scarring within the right upper  lobe overlying the second rib consistent with prior healed infection. There are no acute bony abnormalities. Chronic L1 compression deformity. Previous vertebral augmentation at T12. IMPRESSION: 1. No acute intrathoracic process. Electronically Signed   By: Randa Ngo M.D.   On: 05/07/2021 19:44    Procedures Procedures (including critical care time)  Medications Ordered in UC Medications -  No data to display  Initial Impression / Assessment and Plan / UC Course  I have reviewed the triage vital signs and the nursing notes. Fever of unknown cause R SOM Pertinent  imaging results that were available during my care of the patient were reviewed by me and considered in my medical decision making (see chart for details). Covid and Flu are pending. If they are neg she will need to see PCP to have labs done.   Final Clinical Impressions(s) / UC Diagnoses   Final diagnoses:  Diaphoresis  Fever, unspecified     Discharge Instructions     Your chest xray is normal Lets wait on the Covid test. If negative you need to see your family doctor and have blood work done.     ED Prescriptions    None     PDMP not reviewed this encounter.   Shelby Mattocks, Hershal Coria 05/07/21 2144

## 2021-05-07 NOTE — Patient Instructions (Signed)
Vertigo Vertigo is the feeling that you or the things around you are moving when they are not. This feeling can come and go at any time. Vertigo often goes away on its own. This condition can be dangerous if it happens when you are doing activities like driving or working with machines. Your doctor will do tests to find the cause of your vertigo. These tests will also help your doctor decide on the best treatment for you. Follow these instructions at home: Eating and drinking  Drink enough fluid to keep your pee (urine) pale yellow.  Do not drink alcohol.      Activity  Return to your normal activities as told by your doctor. Ask your doctor what activities are safe for you.  In the morning, first sit up on the side of the bed. When you feel okay, stand slowly while you hold onto something until you know that your balance is fine.  Move slowly. Avoid sudden body or head movements or certain positions, as told by your doctor.  Use a cane if you have trouble standing or walking.  Sit down right away if you feel dizzy.  Avoid doing any tasks or activities that can cause danger to you or others if you get dizzy.  Avoid bending down if you feel dizzy. Place items in your home so that they are easy for you to reach without leaning over.  Do not drive or use heavy machinery if you feel dizzy. General instructions  Take over-the-counter and prescription medicines only as told by your doctor.  Keep all follow-up visits as told by your doctor. This is important. Contact a doctor if:  Your medicine does not help your vertigo.  You have a fever.  Your problems get worse or you have new symptoms.  Your family or friends see changes in your behavior.  The feeling of being sick to your stomach gets worse.  Your vomiting gets worse.  You lose feeling (have numbness) in part of your body.  You feel prickling and tingling in a part of your body. Get help right away if:  You have  trouble moving or talking.  You are always dizzy.  You pass out (faint).  You get very bad headaches.  You feel weak in your hands, arms, or legs.  You have changes in your hearing.  You have changes in how you see (vision).  You get a stiff neck.  Bright light starts to bother you. Summary  Vertigo is the feeling that you or the things around you are moving when they are not.  Your doctor will do tests to find the cause of your vertigo.  You may be told to avoid some tasks, positions, or movements.  Contact a doctor if your medicine is not helping, or if you have a fever, new symptoms, or a change in behavior.  Get help right away if you get very bad headaches, or if you have changes in how you speak, hear, or see. This information is not intended to replace advice given to you by your health care provider. Make sure you discuss any questions you have with your health care provider. Document Revised: 10/16/2018 Document Reviewed: 10/16/2018 Elsevier Patient Education  2021 [employer].   How to Perform the Epley Maneuver The Epley maneuver is an exercise that relieves symptoms of vertigo. Vertigo is the feeling that you or your surroundings are moving when they are not. When you feel vertigo, you may feel like the  room is spinning and may have trouble walking. The Epley maneuver is used for a type of vertigo caused by a calcium deposit in a part of the inner ear. The maneuver involves changing head positions to help the deposit move out of the area. You can do this maneuver at home whenever you have symptoms of vertigo. You can repeat it in 24 hours if your vertigo has not gone away. Even though the Epley maneuver may relieve your vertigo for a few weeks, it is possible that your symptoms will return. This maneuver relieves vertigo, but it does not relieve dizziness. What are the risks? If it is done correctly, the Epley maneuver is considered safe. Sometimes it can lead to  dizziness or nausea that goes away after a short time. If you develop other symptoms--such as changes in vision, weakness, or numbness--stop doing the maneuver and call your health care provider. Supplies needed:  A bed or table.  A pillow. How to do the Epley maneuver 1. Sit on the edge of a bed or table with your back straight and your legs extended or hanging over the edge of the bed or table. 2. Turn your head halfway toward the affected ear or side as told by your health care provider. 3. Lie backward quickly with your head turned until you are lying flat on your back. You may want to position a pillow under your shoulders. 4. Hold this position for at least 30 seconds. If you feel dizzy or have symptoms of vertigo, continue to hold the position until the symptoms stop. 5. Turn your head to the opposite direction until your unaffected ear is facing the floor. 6. Hold this position for at least 30 seconds. If you feel dizzy or have symptoms of vertigo, continue to hold the position until the symptoms stop. 7. Turn your whole body to the same side as your head so that you are positioned on your side. Your head will now be nearly facedown. Hold for at least 30 seconds. If you feel dizzy or have symptoms of vertigo, continue to hold the position until the symptoms stop. 8. Sit back up. You can repeat the maneuver in 24 hours if your vertigo does not go away.      Follow these instructions at home: For 24 hours after doing the Epley maneuver:  Keep your head in an upright position.  When lying down to sleep or rest, keep your head raised (elevated) with two or more pillows.  Avoid excessive neck movements. Activity  Do not drive or use machinery if you feel dizzy.  After doing the Epley maneuver, return to your normal activities as told by your health care provider. Ask your health care provider what activities are safe for you. General instructions  Drink enough fluid to keep your  urine pale yellow.  Do not drink alcohol.  Take over-the-counter and prescription medicines only as told by your health care provider.  Keep all follow-up visits as told by your health care provider. This is important. Preventing vertigo symptoms Ask your health care provider if there is anything you should do at home to prevent vertigo. He or she may recommend that you:  Keep your head elevated with two or more pillows while you sleep.  Do not sleep on the side of your affected ear.  Get up slowly from bed.  Avoid sudden movements during the day.  Avoid extreme head positions or movement, such as looking up or bending over. Contact a  health care provider if:  Your vertigo gets worse.  You have other symptoms, including: ? Nausea. ? Vomiting. ? Headache. Get help right away if you:  Have vision changes.  Have a headache or neck pain that is severe or getting worse.  Cannot stop vomiting.  Have new numbness or weakness in any part of your body. Summary  Vertigo is the feeling that you or your surroundings are moving when they are not.  The Epley maneuver is an exercise that relieves symptoms of vertigo.  If the Epley maneuver is done correctly, it is considered safe and relieves vertigo quickly. This information is not intended to replace advice given to you by your health care provider. Make sure you discuss any questions you have with your health care provider. Document Revised: 09/19/2019 Document Reviewed: 09/19/2019 Elsevier Patient Education  2021 [employer].

## 2021-05-07 NOTE — Discharge Instructions (Signed)
Your chest xray is normal Lets wait on the Covid test. If negative you need to see your family doctor and have blood work done.

## 2021-05-07 NOTE — Progress Notes (Signed)
[name], Penny [name] are scheduled for a virtual visit with your provider today.    Just as we do with appointments in the office, we must obtain your consent to participate.  Your consent will be active for this visit and any virtual visit you may have with one of our providers in the next 365 days.    If you have a MyChart account, I can also send a copy of this consent to you electronically.  All virtual visits are billed to your insurance company just like a traditional visit in the office.  As this is a virtual visit, video technology does not allow for your provider to perform a traditional examination.  This may limit your provider's ability to fully assess your condition.  If your provider identifies any concerns that need to be evaluated in person or the need to arrange testing such as labs, EKG, etc, we will make arrangements to do so.    Although advances in technology are sophisticated, we cannot ensure that it will always work on either your end or our end.  If the connection with a video visit is poor, we may have to switch to a telephone visit.  With either a video or telephone visit, we are not always able to ensure that we have a secure connection.   I need to obtain your verbal consent now.   Are you willing to proceed with your visit today?   Penny [name] has provided verbal consent on 05/07/2021 for a virtual visit (video or telephone).   Penny Daring, PA-C 05/07/2021  1:40 PM    MyChart Video Visit    Virtual Visit via Video Note   This visit type was conducted due to national recommendations for restrictions regarding the COVID-19 Pandemic (e.g. social distancing) in an effort to limit this patient's exposure and mitigate transmission in our community. This patient is at least at moderate risk for complications without adequate follow up. This format is felt to be most appropriate for this patient at this time. Physical exam was limited by quality of the video and audio  technology used for the visit.   Patient location: Home lying in bed in dark room Provider location: Home office in [place]  I discussed the limitations of evaluation and management by telemedicine and the availability of in person appointments. The patient expressed understanding and agreed to proceed.  [name]   DOB: [birth year]   35 y.o. Female  MRN: [id number] Visit Date: 05/07/2021  Today's healthcare provider: Mar Daring, PA-C   No chief complaint on file.  Subjective    Dizziness This is a new problem. The current episode started today. The problem occurs constantly. The problem has been unchanged. Associated symptoms include abdominal pain, diaphoresis, fatigue, headaches, nausea, vertigo and vomiting. Pertinent negatives include no chest pain, chills, congestion, coughing, fever, neck pain, numbness, sore throat, swollen glands or weakness. The symptoms are aggravated by bending and standing (turning of head and sitting up). She has tried eating, lying down, position changes and rest for the symptoms. The treatment provided mild (lying flat room spinning subsides) relief.    Has had stressful week. Symptoms started today with feeling hot while at her daughter's graduation. Had wave of nausea at that time as well. Tried to go eat at [employer] afterwards, had severe nausea, vomited once in the parking lot (food content) and then dizziness set in. Was able to make it home and has been resting since. Lying flat  allows spinning to stop. Lifting head, standing, turning of head spinning starts back. Feels room is spinning around her.  Patient Active Problem List   Diagnosis Date Noted  . Chronic prescription opiate use 12/25/2020  . 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 Constipation 12/25/2020  . Major depressive disorder, recurrent episode ([place]) 06/22/2020  . Major depression 06/19/2020  . Dehydration   . Chronic abdominal pain 09/19/2019  .  Gastroesophageal reflux disease without esophagitis   . Cannabis abuse 08/30/2019  . Dyspnea   . Severe anxiety 08/21/2019  . Absolute anemia 04/23/2019  . Urge incontinence 10/26/2018  . S/P kyphoplasty 08/02/2018  . Herpes genitalis in women 12/19/2017  . Cannabis use disorder, moderate, dependence ([place]) 07/07/2017  . Abnormal urine odor 03/30/2017  . Urinary tract infection with hematuria 03/30/2017  . Screening for genitourinary condition 03/30/2017  . Pain 03/30/2017  . Burning with urination 03/30/2017  . Family history of breast cancer in mother,uncertain BR CA status 02/28/2017  . Fibroid 01/18/2017  . Chronic pain syndrome 12/05/2016  . Folate deficiency 12/04/2016  . Anastomotic ulcer 04/22/2016  . Pain medication agreement signed 04/22/2016  . Complaints of leg weakness   . Paresthesia   . Left-sided weakness   . Uncontrolled pain 01/24/2016  . Malnutrition of moderate degree 01/24/2016  . Neuropathy 01/23/2016  . Inability to walk 01/23/2016  . Paresthesias   . Bilateral leg numbness 11/26/2015  . 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 Post partum depression 05/09/2015  . Hematemesis 04/07/2015  . Intractable vomiting with nausea 04/07/2015  . Elevated liver enzymes   . Epigastric pain   . 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 Seizure-like activity ([place]) 02/22/2015  . Fibromyalgia 02/18/2015  . Vulvar fissure 02/18/2015  . Clinical depression 02/01/2015  . Current smoker 01/29/2015  . Current tobacco use 01/29/2015  . 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 Leg weakness, bilateral 01/02/2015  . Gait disorder 01/02/2015  . Other symptoms and signs involving the musculoskeletal system 01/02/2015  . Abnormal gait 01/02/2015  . Cervicalgia 12/18/2014  . Sciatica of left side 11/14/2014  . Neuralgia neuritis, sciatic nerve 11/14/2014  . 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 Encounter for  sterilization 09/09/2014  . Abdominal pain, acute, right lower quadrant 08/22/2014  . Headache, migraine 08/19/2014  . Seizure ([place]) 08/18/2014  . Encephalopathy 08/13/2014  . Headache 08/13/2014  . Altered mental status 08/13/2014  . Right leg numbness 07/31/2014  . Disturbance of skin sensation 07/31/2014  . Hypertension in pregnancy, transient 07/08/2014  . Previous gastric bypass affecting pregnancy, antepartum 05/22/2014  . History of bariatric surgery 05/22/2014  . 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 Antepartum mental disorder in pregnancy 05/09/2014  . Rapid palpitations 02/18/2014  . H/O maternal third degree perineal laceration, currently pregnant 02/18/2014  . High-risk pregnancy 02/18/2014  . Supervision of pregnancy with other poor reproductive or obstetric history, unspecified trimester 02/18/2014  . Restless legs 01/16/2014  . Restless leg 01/16/2014  . Chronic interstitial cystitis 10/23/2013  . Menorrhagia 07/18/2013  . Excessive and frequent menstruation 07/18/2013  . h/o Opiate addiction 03/11/2012    Class: Acute  . History of migraine headaches 03/10/2012    Class: Acute  . Depression with anxiety 03/10/2012    Class: Chronic  . 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 Attention deficit hyperactivity disorder, predominantly  inattentive type 03/10/2012    Class: Chronic  . Panic disorder without agoraphobia 03/10/2012  . H/O disease 03/10/2012  . Dysthymia 03/10/2012  . 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 Other specified behavioral and emotional disorders with onset usually occurring in childhood and adolescence 03/10/2012  . Pelvic congestion syndrome 10/13/2011  . Coitalgia 10/13/2011  . Chronic migraine without aura 10/13/2011  . Unspecified dyspareunia 10/13/2011  . Other specified conditions associated with  female genital organs and menstrual cycle 10/13/2011  . IBS (irritable bowel syndrome) 08/25/2011  . OBESITY, UNSPECIFIED 09/17/2010  . Iron deficiency anemia 07/23/2010   Past Medical History:  Diagnosis Date  . ADD (attention deficit disorder)   . Anemia 2011   2o to GASTRIC BYPASS  . Anginal pain ([place])   . Anxiety   . Asthma   . Blood transfusion without reported diagnosis   . Chronic daily headache   . Depression   . Depression   . Dysmenorrhea 09/18/2013  . Dysrhythmia   . Elevated liver enzymes JUL 2011ALK PHOS 111-127 AST  143-267 ALT  213-321T BILI 0.6  ALB  3.7-4.06 Jun 2011 ALK PHOS 118 AST 24 ALT 42 T BILI 0.4 ALB 3.9  . Encounter for drug rehabilitation    at behavioral health for opioid addiction about 4 years ago  . Family history of adverse reaction to anesthesia    'dad had to be kept on pump for breathing for morphine'  . Fatty liver   . Fibroid 01/18/2017  . Fibromyalgia   . Gastric bypass status for obesity   . Gastritis JULY 2011  . Heart murmur   . 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 History of Holter monitoring   . Hx of opioid abuse ([place])    for about 4 years, about 4 years ago  . Interstitial cystitis   . Iron deficiency anemia 07/23/2010  . Irritable bowel syndrome 2012 DIARRHEA   JUN 2012 TTG IgA 14.9  . IUD FEB 2010  . Lupus ([place])   . Menorrhagia 07/18/2013  . Migraines   . Obesity (BMI 30-39.9) 2011 228 LBS BMI 36.8  . Osteoporosis   . Ovarian cyst   . Patient desires pregnancy 09/18/2013  . Polyneuropathy   . PONV (postoperative nausea and vomiting) seizure post-operatively   seizure following ankle surgery  . Potassium (K) deficiency   . Pregnant 12/25/2013  . Psychiatric pseudoseizure   . RLQ abdominal pain 07/18/2013  . Sciatica of left side 11/14/2014  . 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 Stress 09/18/2013      Medications: Outpatient Medications Prior to Visit  Medication Sig  .  amphetamine-dextroamphetamine (ADDERALL XR) 30 MG 24 hr capsule Take 1 capsule (30 mg total) by mouth every morning.  . Buprenorphine HCl 900 MCG FILM Place 900 mcg inside cheek 2 (two) times daily.  . busPIRone (BUSPAR) 10 MG tablet Take 1 tablet (10 mg total) by mouth 3 (three) times daily. (Patient taking differently: Take 10 mg by mouth 3 (three) times daily as needed (anxiety). As needed per patient.)  . Calcium Carb-Cholecalciferol (CALCIUM-VITAMIN D3) 600-500 MG-UNIT CAPS Take 1 tablet by mouth daily.  . Carboxymethylcellul-Glycerin (LUBRICATING EYE DROPS OP) Place 1 drop into both eyes daily as needed (dry eyes).  . cetirizine (ZYRTEC) 10 MG tablet TAKE ONE TABLET BY MOUTH DAILY (Patient taking differently: Take 10 mg by mouth daily.)  . clidinium-chlordiazePOXIDE (LIBRAX) 5-2.5 MG capsule Take 1 capsule by  mouth 3 (three) times daily before meals.  . clonazePAM (KLONOPIN) 0.5 MG tablet Take 0.5 mg by mouth 2 (two) times daily as needed.  . cyclobenzaprine (FLEXERIL) 10 MG tablet Take 1 tablet (10 mg total) by mouth 3 (three) times daily as needed for muscle spasms.  . DULoxetine (CYMBALTA) 60 MG capsule Take 1 capsule (60 mg total) by mouth 2 (two) times daily.  . fluconazole (DIFLUCAN) 150 MG tablet One po qd prn yeast infection; may repeat in 3-4 days if needed (Patient taking differently: Take 150 mg by mouth See admin instructions. One po qd prn yeast infection; may repeat in 3-4 days if needed)  . fluticasone (FLOVENT HFA) 220 MCG/ACT inhaler Inhale 2 puffs into the lungs 2 (two) times daily. Do not inhale, swallow powder and drink water , do not eat food for 20 minutes after puffs  . HYDROmorphone (DILAUDID) 4 MG tablet Take 4 mg by mouth every 4 (four) hours as needed for severe pain.  Marland Kitchen lansoprazole (PREVACID) 30 MG capsule Take 1 capsule (30 mg total) by mouth daily.  Marland Kitchen levalbuterol (XOPENEX HFA) 45 MCG/ACT inhaler INHALE (2) PUFFS INTO THE LUNGS EVERY SIX HOURS AS NEEDED FOR  WHEEZING. (Patient taking differently: Inhale 2 puffs into the lungs every 6 (six) hours as needed for wheezing or shortness of breath. INHALE (2) PUFFS INTO THE LUNGS EVERY SIX HOURS AS NEEDED FOR WHEEZING.)  . levalbuterol (XOPENEX) 1.25 MG/3ML nebulizer solution Take 1.25 mg by nebulization every 4 (four) hours as needed for wheezing.  Marland Kitchen linaclotide (LINZESS) 290 MCG CAPS capsule Take 1 capsule (290 mcg total) by mouth daily before breakfast.  . naloxegol oxalate (MOVANTIK) 12.5 MG TABS tablet Take 2 tablets (25 mg total) by mouth daily.  . ondansetron (ZOFRAN) 8 MG tablet Take 1 tablet (8 mg total) by mouth every 8 (eight) hours as needed for nausea.  . Pediatric Multivit-Minerals-C (FLINTSTONES GUMMIES) chewable tablet Chew 3 tablets by mouth daily.  . promethazine (PHENERGAN) 12.5 MG tablet TAKE 1 TABLET BY MOUTH EVERY 8 HOURS AS NEEDED FOR NAUSEA OR VOMITING (Patient taking differently: Take 12.5 mg by mouth every 8 (eight) hours as needed for nausea or vomiting.)  . promethazine (PHENERGAN) 25 MG tablet Take 25 mg by mouth 2 (two) times daily as needed for nausea or vomiting.  Marland Kitchen rOPINIRole (REQUIP) 1 MG tablet Take one tablet po each evening. (Patient taking differently: Take 1 mg by mouth at bedtime.)  . sucralfate (CARAFATE) 1 g tablet Take 1 tablet (1 g total) by mouth 4 (four) times daily -  with meals and at bedtime.  . traZODone (DESYREL) 100 MG tablet Take 2 tablets (200 mg total) by mouth at bedtime.  . valACYclovir (VALTREX) 1000 MG tablet TAKE 1 TABLET BY MOUTH DAILY. (Patient taking differently: Take 1,000 mg by mouth daily as needed (fever blisters).)  . Vitamin D, Ergocalciferol, (DRISDOL) 1.25 MG (50000 UNIT) CAPS capsule Take 50,000 Units by mouth every Wednesday.   No facility-administered medications prior to visit.    Review of Systems  Constitutional: Positive for diaphoresis and fatigue. Negative for chills and fever.  HENT: Negative for congestion and sore throat.    Respiratory: Negative for cough.   Cardiovascular: Negative for chest pain.  Gastrointestinal: Positive for abdominal pain, nausea and vomiting.  Musculoskeletal: Negative for neck pain.  Neurological: Positive for dizziness, vertigo and headaches. Negative for weakness and numbness.    Last CBC Lab Results  Component Value Date   WBC 6.1 03/24/2021  HGB 11.0 (L) 03/24/2021   HCT 33.5 (L) 03/24/2021   MCV 88 03/24/2021   MCH 28.8 03/24/2021   RDW 14.4 03/24/2021   PLT 287 07/37/1062   Last metabolic panel Lab Results  Component Value Date   GLUCOSE 126 (H) 01/14/2021   NA 134 (L) 01/14/2021   K 4.0 01/14/2021   CL 104 01/14/2021   CO2 23 01/14/2021   BUN 17 01/14/2021   CREATININE 0.48 01/14/2021   GFRNONAA >60 01/14/2021   GFRAA 134 01/01/2021   CALCIUM 8.8 (L) 01/14/2021   PROT 6.8 01/14/2021   ALBUMIN 3.8 01/14/2021   LABGLOB 2.7 01/01/2021   AGRATIO 1.3 01/01/2021   BILITOT 0.5 01/14/2021   ALKPHOS 52 01/14/2021   AST 60 (H) 01/14/2021   ALT 69 (H) 01/14/2021   ANIONGAP 7 01/14/2021   Last lipids Lab Results  Component Value Date   CHOL 95 03/10/2012   HDL 38 (L) 03/10/2012   LDLCALC 47 03/10/2012   TRIG 50 03/10/2012   CHOLHDL 2.5 03/10/2012      Objective    There were no vitals taken for this visit. BP Readings from Last 3 Encounters:  04/15/21 (!) 91/59  04/14/21 94/64  04/10/21 128/82   Wt Readings from Last 3 Encounters:  04/14/21 156 lb (70.8 kg)  04/10/21 156 lb (70.8 kg)  03/26/21 157 lb (71.2 kg)      Physical Exam Vitals reviewed.  Constitutional:      General: She is not in acute distress.    Appearance: Normal appearance. She is well-developed. She is ill-appearing.     Comments: Resting comfortably in bed, but not moving head  HENT:     Head: Normocephalic and atraumatic.  Pulmonary:     Effort: Pulmonary effort is normal. No respiratory distress.  Neurological:     Mental Status: She is alert.  Psychiatric:         Mood and Affect: Mood normal.        Behavior: Behavior normal.        Thought Content: Thought content normal.        Judgment: Judgment normal.        Assessment & Plan     1. Vertigo - Suspect BPPV or simple vertigo - Will give Meclizine as below; advised can use sudafed $RemoveBef'10mg'[id number]$  OTC as well - Push fluids - Rest as needed - Information on Epley maneuver provided to patient via AVS - Seek in-person evaluation if vertigo worsens or does not resolve - meclizine (ANTIVERT) 25 MG tablet; Take 1 tablet (25 mg total) by mouth 3 (three) times daily as needed for dizziness.  Dispense: 30 tablet; Refill: 0   No follow-ups on file.     I discussed the assessment and treatment plan with the patient. The patient was provided an opportunity to ask questions and all were answered. The patient agreed with the plan and demonstrated an understanding of the instructions.   The patient was advised to call back or seek an in-person evaluation if the symptoms worsen or if the condition fails to improve as anticipated.  I provided 15 minutes of face-to-face time during this encounter via MyChart Video enabled encounter.   [place] [phone] (phone) [phone] (fax)  [place]

## 2021-05-07 NOTE — ED Triage Notes (Signed)
Fever on and off x 1 week.  States she has been sweating today.  States she vomited after eating today.  States she has been dizzy today.  Did a virtual visit today and was prescribed Antivert.  States the medication is not helping.

## 2021-05-08 ENCOUNTER — Encounter: Payer: Self-pay | Admitting: Family Medicine

## 2021-05-08 LAB — COVID-19, FLU A+B NAA
Influenza A, NAA: NOT DETECTED
Influenza B, NAA: NOT DETECTED
SARS-CoV-2, NAA: NOT DETECTED

## 2021-05-08 NOTE — Telephone Encounter (Signed)
Sorry that the patient is feeling bad  Unfortunately our office schedule today was overbooked with no availabilities  Please have Kayda set up an appointment either with Dr. Lovena Le next week or the following week but myself when I return If necessary to go to urgent care or ER thank you-Dr. Nicki Reaper

## 2021-05-12 ENCOUNTER — Other Ambulatory Visit: Payer: Self-pay

## 2021-05-12 ENCOUNTER — Encounter (HOSPITAL_COMMUNITY): Payer: Self-pay | Admitting: Psychiatry

## 2021-05-12 ENCOUNTER — Telehealth (INDEPENDENT_AMBULATORY_CARE_PROVIDER_SITE_OTHER): Payer: Medicaid Other | Admitting: Psychiatry

## 2021-05-12 DIAGNOSIS — F332 Major depressive disorder, recurrent severe without psychotic features: Secondary | ICD-10-CM

## 2021-05-12 DIAGNOSIS — F411 Generalized anxiety disorder: Secondary | ICD-10-CM | POA: Diagnosis not present

## 2021-05-12 DIAGNOSIS — F900 Attention-deficit hyperactivity disorder, predominantly inattentive type: Secondary | ICD-10-CM

## 2021-05-12 MED ORDER — AMPHETAMINE-DEXTROAMPHET ER 30 MG PO CP24: 30.0000 mg | ORAL_CAPSULE | ORAL | 0 refills | Status: DC

## 2021-05-12 MED ORDER — CLONAZEPAM 0.5 MG PO TABS: 0.5000 mg | ORAL_TABLET | Freq: Two times a day (BID) | ORAL | 2 refills | Status: DC | PRN

## 2021-05-12 MED ORDER — TRAZODONE HCL 100 MG PO TABS: 200.0000 mg | ORAL_TABLET | Freq: Every day | ORAL | 2 refills | Status: DC

## 2021-05-12 MED ORDER — DULOXETINE HCL 60 MG PO CPEP: 60.0000 mg | ORAL_CAPSULE | Freq: Two times a day (BID) | ORAL | 2 refills | Status: DC

## 2021-05-12 MED ORDER — BUSPIRONE HCL 10 MG PO TABS: 10.0000 mg | ORAL_TABLET | Freq: Three times a day (TID) | ORAL | 2 refills | Status: DC

## 2021-05-12 NOTE — Progress Notes (Signed)
Virtual Visit via Telephone Note  I connected with Penny [name] on 05/12/21 at 11:40 AM EDT by telephone and verified that I am speaking with the correct person using two identifiers.  Location: Patient: home Provider: home office   I discussed the limitations, risks, security and privacy concerns of performing an evaluation and management service by telephone and the availability of in person appointments. I also discussed with the patient that there may be a patient responsible charge related to this service. The patient expressed understanding and agreed to proceed.    I discussed the assessment and treatment plan with the patient. The patient was provided an opportunity to ask questions and all were answered. The patient agreed with the plan and demonstrated an understanding of the instructions.   The patient was advised to call back or seek an in-person evaluation if the symptoms worsen or if the condition fails to improve as anticipated.  I provided 15 minutes of non-face-to-face time during this encounter.   [name], MD  [facility] MD/PA/NP OP Progress Note  05/12/2021 12:12 PM Penny [name]  MRN:  [id number]  Chief Complaint:  Chief Complaint    Anxiety; Depression; ADD     HPI: This patient is a 35 year old female who is now living alone in [place]. 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 She has 2 children with whom she shares custody with their fathers.  Patient returns for follow-up after 2 months.  She is does sound a bit more upbeat and positive today.  I noted she has had a lot of ER visits recently for various things.  She is still having a good deal of low back pain but the pain medicine is managing it for the most part.  She states that at times her anxiety has been worse than others.  She is not taking the BuSpar consistently and I urged her to take it.  The clonazepam continues to help.  She denies significant depression or suicidal ideation as  going to try to spend a lot of time with her children this summer doing fun activities.  She is sleeping fairly well.  The Adderall continues to help with her focus and alertness. Visit Diagnosis:    ICD-10-CM   1. Severe episode of recurrent major depressive disorder, without psychotic features ([place])  F33.2   2. GAD (generalized anxiety disorder)  F41.1   3. Attention deficit hyperactivity disorder (ADHD), predominantly inattentive type  F90.0     Past Psychiatric History: Long-term outpatient treatment, hospitalization last year for depression  Past Medical History:  Past Medical History:  Diagnosis Date  . ADD (attention deficit disorder)   . Anemia 2011   2o to GASTRIC BYPASS  . Anginal pain ([place])   . Anxiety   . Asthma   . Blood transfusion without reported diagnosis   . Chronic daily headache   . Depression   . Depression   . Dysmenorrhea 09/18/2013  . Dysrhythmia   . Elevated liver enzymes JUL 2011ALK PHOS 111-127 AST  143-267 ALT  213-321T BILI 0.6  ALB  3.7-4.06 Jun 2011 ALK PHOS 118 AST 24 ALT 42 T BILI 0.4 ALB 3.9  . Encounter for drug rehabilitation    at behavioral health for opioid addiction about 4 years ago  . Family history of adverse reaction to anesthesia    'dad had to be kept on pump for breathing for morphine'  . Fatty liver   . Fibroid 01/18/2017  . Fibromyalgia   .  Gastric bypass status for obesity   . Gastritis JULY 2011  . Heart murmur   . 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 History of Holter monitoring   . Hx of opioid abuse ([place])    for about 4 years, about 4 years ago  . Interstitial cystitis   . Iron deficiency anemia 07/23/2010  . Irritable bowel syndrome 2012 DIARRHEA   JUN 2012 TTG IgA 14.9  . IUD FEB 2010  . Lupus ([place])   . Menorrhagia 07/18/2013  . Migraines   . Obesity (BMI 30-39.9) 2011 228 LBS BMI 36.8  . Osteoporosis   . Ovarian cyst   . Patient desires pregnancy 09/18/2013  . Polyneuropathy   . PONV  (postoperative nausea and vomiting) seizure post-operatively   seizure following ankle surgery  . Potassium (K) deficiency   . Pregnant 12/25/2013  . Psychiatric pseudoseizure   . RLQ abdominal pain 07/18/2013  . Sciatica of left side 11/14/2014  . 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 Stress 09/18/2013    Past Surgical History:  Procedure Laterality Date  . BIOPSY  04/10/2015   Procedure: BIOPSY;  Surgeon: Daneil Dolin, MD;  Location: [facility];  Service: Endoscopy;;  . BIOPSY  04/15/2021   Procedure: BIOPSY;  Surgeon: Harvel Quale, MD;  [facility];  Service: Gastroenterology;;  . cath self every nite     for sodium bicarb injection (discontinued 2013)  . CHOLECYSTECTOMY  2005   biliary dyskinesia  . COLONOSCOPY  JUN 2012 ABD PN/DIARRHEA WITH PROPOFOL   NL COLON  . COLONOSCOPY WITH PROPOFOL N/A 01/16/2021   Procedure: COLONOSCOPY WITH PROPOFOL;  Surgeon: Harvel Quale, MD;  [facility];  Service: Gastroenterology;  Laterality: N/A;  1:45  . DILATION AND CURETTAGE OF UTERUS    . DILITATION & CURRETTAGE/HYSTROSCOPY WITH NOVASURE ABLATION N/A 03/24/2017   Procedure: DILATATION & CURETTAGE/HYSTEROSCOPY WITH NOVASURE ENDOMETRIAL ABLATION;  Surgeon: Jonnie Kind, MD;  Location: [facility];  Service: Gynecology;  Laterality: N/A;  . ESOPHAGEAL DILATION N/A 04/10/2015   Procedure: ESOPHAGEAL DILATION WITH 54FR MALONEY DILATOR;  Surgeon: Daneil Dolin, MD;  Location: [facility];  Service: Endoscopy;  Laterality: N/A;  . ESOPHAGOGASTRODUODENOSCOPY    . ESOPHAGOGASTRODUODENOSCOPY (EGD) WITH PROPOFOL N/A 04/10/2015   Procedure: ESOPHAGOGASTRODUODENOSCOPY (EGD) WITH PROPOFOL;  Surgeon: Daneil Dolin, MD;  Location[facility];  Service: Endoscopy;  Laterality: N/A;  . ESOPHAGOGASTRODUODENOSCOPY (EGD) WITH PROPOFOL N/A 12/06/2016   Procedure: ESOPHAGOGASTRODUODENOSCOPY (EGD) WITH PROPOFOL;  Surgeon: Danie Binder, MD;  [facility];  Service:  Endoscopy;  Laterality: N/A;  . ESOPHAGOGASTRODUODENOSCOPY (EGD) WITH PROPOFOL N/A 04/27/2019   Procedure: ESOPHAGOGASTRODUODENOSCOPY (EGD) WITH PROPOFOL;  Surgeon: Rogene Houston, MD;  [facility];  Service: Endoscopy;  Laterality: N/A;  730  . ESOPHAGOGASTRODUODENOSCOPY (EGD) WITH PROPOFOL N/A 08/31/2019   Procedure: ESOPHAGOGASTRODUODENOSCOPY (EGD) WITH PROPOFOL;  Surgeon: Rogene Houston, MD;  [facility];  Service: Endoscopy;  Laterality: N/A;  . ESOPHAGOGASTRODUODENOSCOPY (EGD) WITH PROPOFOL N/A 01/16/2021   Procedure: ESOPHAGOGASTRODUODENOSCOPY (EGD) WITH PROPOFOL;  Surgeon: Harvel Quale, MD;  [facility];  Service: Gastroenterology;  Laterality: N/A;  . ESOPHAGOGASTRODUODENOSCOPY (EGD) WITH PROPOFOL N/A 04/15/2021   Procedure: ESOPHAGOGASTRODUODENOSCOPY (EGD) WITH PROPOFOL;  Surgeon: Harvel Quale, MD;  [facility];  Service: Gastroenterology;  Laterality: N/A;  9:00 AM  . GAB  2007   in [place]-POUCH 5 CM  . GASTRIC BYPASS  06/2006  .  HYSTEROSCOPY WITH D & C N/A 09/12/2014   Procedure: DILATATION AND CURETTAGE /HYSTEROSCOPY;  Surgeon: Jonnie Kind, MD;  Location: [facility];  Service: Gynecology;  Laterality: N/A;  . KYPHOPLASTY N/A 08/02/2018   Procedure: KYPHOPLASTY T12;  Surgeon: Melina Schools, MD;  Location: [place];  Service: Orthopedics;  Laterality: N/A;  60 mins  . LAPAROSCOPIC TUBAL LIGATION Bilateral 03/24/2017   Procedure: LAPAROSCOPIC TUBAL LIGATION ([place] Rings);  Surgeon: Jonnie Kind, MD;  Location: [facility];  Service: Gynecology;  Laterality: Bilateral;  . REPAIR VAGINAL CUFF N/A 07/30/2014   Procedure: REPAIR VAGINAL CUFF;  Surgeon: Mora Bellman, MD;  Location: [place];  Service: Gynecology;  Laterality: N/A;  . SAVORY DILATION  06/20/2012   Dr. Barnie Alderman gastritis/Ulcer in the mid jejunum. Empiric dilation.   . SURAL NERVE BX Left 02/25/2016   Procedure: LEFT SURAL NERVE BIOPSY;  Surgeon: Jovita Gamma, MD;  Location: [place] [facility];  Service: Neurosurgery;  Laterality: Left;  Left sural nerve biopsy  . TONSILLECTOMY    . TONSILLECTOMY AND ADENOIDECTOMY    . UPPER GASTROINTESTINAL ENDOSCOPY  JULY 2011 NAUSEA-D125,V6, PH 25   Bx; GASTRITIS, POUCH-5 CM LONG  . WISDOM TOOTH EXTRACTION      Family Psychiatric History: see below  Family History:  Family History  Problem Relation Age of Onset  . Hemochromatosis Maternal Grandmother   . Migraines Maternal Grandmother   . Cancer Maternal Grandmother   . Breast cancer Maternal Grandmother   . Hypertension Father   . Diabetes Father   . Coronary artery disease Father   . Migraines Paternal Grandmother   . Breast cancer Paternal Grandmother   . Cancer Mother        breast  . Hemochromatosis Mother   . Breast cancer Mother   . Depression Mother   . Anxiety disorder Mother   . Coronary artery disease Paternal Grandfather   . Anxiety disorder Brother   . Bipolar disorder Brother   . Healthy Daughter   . Healthy Son     Social History:  Social History   Socioeconomic History  . Marital status: Divorced    Spouse name: Not on file  . Number of children: 2  . Years of education: some college  . Highest education level: Some college, no degree  Occupational History  . Occupation: Applying for [employer]: NOT EMPLOYED  Tobacco Use  . Smoking status: Former Smoker    Packs/day: 0.25    Years: 6.00    Pack years: 1.50    Types: Cigarettes    Start date: 05/2018  . Smokeless tobacco: Never Used  Vaping Use  . Vaping Use: Some days  Substance and Sexual Activity  . Alcohol use: Not Currently    Alcohol/week: 0.0 standard drinks  . Drug use: Not Currently  . Sexual activity: Yes    Partners: Male    Birth control/protection: Surgical, Condom    Comment: tubal and ablation  Other Topics Concern  . Not on file  Social History Narrative   Patient is right handed.   Patient drinks one cup  of coffee daily.   She lives in a one-story home with her two children (4 year old son, 89 month old daughter).   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 Difficulty of Paying Living Expenses: Very  hard  Food Insecurity: No Food Insecurity  . Worried About Charity fundraiser in the Last Year: Never true  . Ran Out of Food in the Last Year: Never true  Transportation Needs: No Transportation Needs  . Lack of Transportation (Medical): No  . Lack of Transportation (Non-Medical): No  Physical Activity: Inactive  . Days of Exercise per Week: 0 days  . Minutes of Exercise per Session: 0 min  Stress: Stress Concern Present  . Feeling of Stress : Very much  Social Connections: Moderately Isolated  . Frequency of Communication with Friends and Family: More than three times a week  . Frequency of Social Gatherings with Friends and Family: More than three times a week  . Attends Religious Services: More than 4 times per year  . Active Member of Clubs or Organizations: No  . Attends Archivist Meetings: Never  . Marital Status: Divorced    Allergies:  Allergies  Allergen Reactions  . Gabapentin Other (See Comments)    Pt states that she was unresponsive after taking this medication, but her vitals remained stable.    . Metoclopramide Hcl Anxiety and Other (See Comments)    Pt states that she felt like she was trapped in a box, and could not get out.  Pt also states that she had temporary loss of movement, weakness, and tingling.    . Tramadol Other (See Comments)    Seizures  . Ativan [Lorazepam] Other (See Comments)    combative  . Latex Itching, Rash and Other (See Comments)    Burns  . Lyrica [Pregabalin] Other (See Comments)    suicidal  . Iron Other (See Comments)    PATIENT IS INTOLERANT TO ORAL IRON , SHE DOES NOT ABSORB IT.    Marland Kitchen Sulfa  Antibiotics Hives  . Butrans [Buprenorphine] Rash    Patch caused rash, didn't help with pain  . Nucynta [Tapentadol] Nausea And Vomiting  . Propofol Nausea And Vomiting  . Tape Hives, Itching and Rash    Please use paper tape  . Toradol [Ketorolac Tromethamine] Anxiety    Metabolic Disorder Labs: Lab Results  Component Value Date   HGBA1C 5.7 (H) 01/15/2015   Lab Results  Component Value Date   PROLACTIN 80.0 (H) 05/09/2015   Lab Results  Component Value Date   CHOL 95 03/10/2012   TRIG 50 03/10/2012   HDL 38 (L) 03/10/2012   CHOLHDL 2.5 03/10/2012   VLDL 10 03/10/2012   LDLCALC 47 03/10/2012   Lab Results  Component Value Date   TSH 1.350 01/01/2021   TSH 2.616 08/25/2019    Therapeutic Level Labs: No results found for: LITHIUM Lab Results  Component Value Date   VALPROATE 66 06/23/2020   No components found for:  CBMZ  Current Medications: Current Outpatient Medications  Medication Sig Dispense Refill  . amphetamine-dextroamphetamine (ADDERALL XR) 30 MG 24 hr capsule Take 1 capsule (30 mg total) by mouth every morning. 30 capsule 0  . amphetamine-dextroamphetamine (ADDERALL XR) 30 MG 24 hr capsule Take 1 capsule (30 mg total) by mouth every morning. 30 capsule 0  . buprenorphine (SUBUTEX) 2 MG SUBL SL tablet Place 2 mg under the tongue 2 (two) times daily.    . busPIRone (BUSPAR) 10 MG tablet Take 1 tablet (10 mg total) by mouth 3 (three) times daily. 90 tablet 2  . Calcium Carb-Cholecalciferol (CALCIUM-VITAMIN D3) 600-500 MG-UNIT CAPS Take 1 tablet by mouth daily.    . Carboxymethylcellul-Glycerin (LUBRICATING EYE  DROPS OP) Place 1 drop into both eyes daily as needed (dry eyes).    . cetirizine (ZYRTEC) 10 MG tablet TAKE ONE TABLET BY MOUTH DAILY (Patient taking differently: Take 10 mg by mouth daily.) 30 tablet 5  . clidinium-chlordiazePOXIDE (LIBRAX) 5-2.5 MG capsule Take 1 capsule by mouth 3 (three) times daily before meals. 90 capsule 5  . clonazePAM  (KLONOPIN) 0.5 MG tablet Take 1 tablet (0.5 mg total) by mouth 2 (two) times daily as needed. 60 tablet 2  . cyclobenzaprine (FLEXERIL) 10 MG tablet Take 1 tablet (10 mg total) by mouth 3 (three) times daily as needed for muscle spasms. 30 tablet 0  . DULoxetine (CYMBALTA) 60 MG capsule Take 1 capsule (60 mg total) by mouth 2 (two) times daily. 180 capsule 2  . fluconazole (DIFLUCAN) 150 MG tablet One po qd prn yeast infection; may repeat in 3-4 days if needed (Patient taking differently: Take 150 mg by mouth See admin instructions. One po qd prn yeast infection; may repeat in 3-4 days if needed) 2 tablet 0  . fluticasone (FLOVENT HFA) 220 MCG/ACT inhaler Inhale 2 puffs into the lungs 2 (two) times daily. Do not inhale, swallow powder and drink water , do not eat food for 20 minutes after puffs 4 each 2  . HYDROmorphone (DILAUDID) 4 MG tablet Take 4 mg by mouth every 4 (four) hours as needed for severe pain.    Marland Kitchen lansoprazole (PREVACID) 30 MG capsule Take 1 capsule (30 mg total) by mouth daily. 180 capsule 1  . levalbuterol (XOPENEX HFA) 45 MCG/ACT inhaler INHALE (2) PUFFS INTO THE LUNGS EVERY SIX HOURS AS NEEDED FOR WHEEZING. (Patient taking differently: Inhale 2 puffs into the lungs every 6 (six) hours as needed for wheezing or shortness of breath. INHALE (2) PUFFS INTO THE LUNGS EVERY SIX HOURS AS NEEDED FOR WHEEZING.) 15 g 3  . levalbuterol (XOPENEX) 1.25 MG/3ML nebulizer solution Take 1.25 mg by nebulization every 4 (four) hours as needed for wheezing. 50 mL 3  . linaclotide (LINZESS) 290 MCG CAPS capsule Take 1 capsule (290 mcg total) by mouth daily before breakfast. 90 capsule 3  . meclizine (ANTIVERT) 25 MG tablet Take 1 tablet (25 mg total) by mouth 3 (three) times daily as needed for dizziness. 30 tablet 0  . naloxegol oxalate (MOVANTIK) 12.5 MG TABS tablet Take 2 tablets (25 mg total) by mouth daily. 90 tablet 3  . ondansetron (ZOFRAN) 8 MG tablet Take 1 tablet (8 mg total) by mouth every 8  (eight) hours as needed for nausea. 12 tablet 3  . Pediatric Multivit-Minerals-C (FLINTSTONES GUMMIES) chewable tablet Chew 3 tablets by mouth daily.    . promethazine (PHENERGAN) 12.5 MG tablet TAKE 1 TABLET BY MOUTH EVERY 8 HOURS AS NEEDED FOR NAUSEA OR VOMITING (Patient taking differently: Take 12.5 mg by mouth every 8 (eight) hours as needed for nausea or vomiting.) 30 tablet 0  . promethazine (PHENERGAN) 25 MG tablet Take 25 mg by mouth 2 (two) times daily as needed for nausea or vomiting.    Marland Kitchen rOPINIRole (REQUIP) 1 MG tablet Take one tablet po each evening. (Patient taking differently: Take 1 mg by mouth at bedtime.) 30 tablet 5  . sucralfate (CARAFATE) 1 g tablet Take 1 tablet (1 g total) by mouth 4 (four) times daily -  with meals and at bedtime. 180 tablet 3  . traZODone (DESYREL) 100 MG tablet Take 2 tablets (200 mg total) by mouth at bedtime. 60 tablet 2  .  valACYclovir (VALTREX) 1000 MG tablet TAKE 1 TABLET BY MOUTH DAILY. (Patient taking differently: Take 1,000 mg by mouth daily as needed (fever blisters).) 90 tablet PRN  . Vitamin D, Ergocalciferol, (DRISDOL) 1.25 MG (50000 UNIT) CAPS capsule Take 50,000 Units by mouth every Wednesday.     No current facility-administered medications for this visit.     Musculoskeletal: Strength & Muscle Tone: within normal limits Gait & Station: normal Patient leans: N/A  Psychiatric Specialty Exam: Review of Systems  Gastrointestinal: Positive for abdominal pain.  Musculoskeletal: Positive for back pain.  Psychiatric/Behavioral: The patient is nervous/anxious.   All other systems reviewed and are negative.   There were no vitals taken for this visit.There is no height or weight on file to calculate BMI.  General Appearance: NA  Eye Contact:  NA  Speech:  Clear and Coherent  Volume:  Normal  Mood:  Anxious and Euthymic  Affect:  NA  Thought Process:  Goal Directed  Orientation:  Full (Time, Place, and Person)  Thought Content:  Rumination   Suicidal Thoughts:  No  Homicidal Thoughts:  No  Memory:  Immediate;   Good Recent;   Good Remote;   Good  Judgement:  Good  Insight:  Fair  Psychomotor Activity:  Normal  Concentration:  Concentration: Good and Attention Span: Good  Recall:  Good  Fund of Knowledge: Good  Language: Good  Akathisia:  No  Handed:  Right  AIMS (if indicated): not done  Assets:  Communication Skills Desire for Improvement Resilience Social Support Talents/Skills  ADL's:  Intact  Cognition: WNL  Sleep:  Fair   Screenings: AIMS   Flowsheet Row Admission (Discharged) from 06/19/2020 in [facility]  AIMS Total Score 0    AUDIT   Flowsheet Row Patient Outreach Telephone from 07/14/2020 in [facility] Admission (Discharged) from 06/19/2020 in [facility] ED to Hosp-Admission (Discharged) from 05/09/2015 in [facility]  Alcohol Use Disorder Identification Test Final Score (AUDIT) 1 1 0    GAD-7   Flowsheet Row Counselor from 12/11/2018 in [facility] Counselor from 02/07/2017 in [facility]  Total GAD-7 Score 19 17    PHQ2-9   Flowsheet Row Video Visit from 05/12/2021 in [facility] Video Visit from 03/12/2021 in [facility] Nutrition from [facility]/14/2021 [facility] Patient Outreach Telephone from 07/14/2020 in [facility] ED from 06/17/2020 in [facility]  PHQ-2 Total Score 1 0 $R'6 4 4  'Mn$ PHQ-9 Total Score -- -- $Rem'15 19 22    'RDTn$ Flowsheet Row Video Visit from 05/12/2021 in [facility] ED from 05/07/2021 in [facility] Admission (Discharged) from  04/15/2021 in [facility] No Risk No Risk No Risk       Assessment and Plan:  This patient is a 35 year old female with a history of depression anxiety ADHD borderline personality and insomnia.  She does seem to be doing somewhat better.  For now she will continue Cymbalta 120 mg daily for depression, BuSpar 10 mg 3 times daily for anxiety, clonazepam 0.5 mg twice daily as needed for anxiety, trazodone 200 mg at bedtime for sleep and Adderall XR 30 mg every morning for focus.  She will return to see me in 2 months  [name],  MD 05/12/2021, 12:12 PM

## 2021-05-13 ENCOUNTER — Ambulatory Visit: Payer: Medicaid Other | Admitting: Allergy & Immunology

## 2021-05-22 DIAGNOSIS — M546 Pain in thoracic spine: Secondary | ICD-10-CM | POA: Diagnosis not present

## 2021-05-22 DIAGNOSIS — Z79899 Other long term (current) drug therapy: Secondary | ICD-10-CM | POA: Diagnosis not present

## 2021-05-25 DIAGNOSIS — Z79899 Other long term (current) drug therapy: Secondary | ICD-10-CM | POA: Diagnosis not present

## 2021-06-04 ENCOUNTER — Telehealth (HOSPITAL_COMMUNITY): Payer: Self-pay | Admitting: Clinical

## 2021-06-04 ENCOUNTER — Other Ambulatory Visit: Payer: Self-pay

## 2021-06-04 ENCOUNTER — Ambulatory Visit (HOSPITAL_COMMUNITY): Payer: Medicaid Other | Admitting: Clinical

## 2021-06-04 NOTE — Telephone Encounter (Signed)
The patient did not respond to video link, phone call, or VM 

## 2021-06-13 ENCOUNTER — Other Ambulatory Visit: Payer: Self-pay | Admitting: Family Medicine

## 2021-06-19 DIAGNOSIS — M546 Pain in thoracic spine: Secondary | ICD-10-CM | POA: Diagnosis not present

## 2021-06-19 DIAGNOSIS — Z79899 Other long term (current) drug therapy: Secondary | ICD-10-CM | POA: Diagnosis not present

## 2021-06-24 DIAGNOSIS — Z79899 Other long term (current) drug therapy: Secondary | ICD-10-CM | POA: Diagnosis not present

## 2021-07-01 ENCOUNTER — Other Ambulatory Visit: Payer: Self-pay

## 2021-07-01 ENCOUNTER — Ambulatory Visit (INDEPENDENT_AMBULATORY_CARE_PROVIDER_SITE_OTHER): Payer: Medicaid Other | Admitting: Clinical

## 2021-07-01 DIAGNOSIS — F411 Generalized anxiety disorder: Secondary | ICD-10-CM

## 2021-07-01 DIAGNOSIS — F332 Major depressive disorder, recurrent severe without psychotic features: Secondary | ICD-10-CM | POA: Diagnosis not present

## 2021-07-01 NOTE — Progress Notes (Signed)
Virtual Visit via Telephone Note  I connected with Penny [name] on 07/01/21 at  1:00 PM EDT by telephone and verified that I am speaking with the correct person using two identifiers.  Location: Patient: Home Provider: Office   I discussed the limitations, risks, security and privacy concerns of performing an evaluation and management service by telephone and the availability of in person appointments. I also discussed with the patient that there may be a patient responsible charge related to this service. The patient expressed understanding and agreed to proceed.    THERAPIST PROGRESS NOTE   Session Time: 1:00PM-1:40PM   Participation Level: Active   Behavioral Response: CasualAlertDepressed and NA   Type of Therapy: Individual Therapy   Treatment Goals addressed: Coping   Interventions: CBT, Motivational Interviewing, Strength-based and Supportive   Summary: Penny [name] is a 35 y.o. female who presents with GAD and MDD. The OPT therapist worked with the patient for her ongoing OPT treatment session. The OPT therapist utilized Motivational Interviewing to assist in creating therapeutic repore. The patient in the session was engaged and work in collaboration giving feedback about her triggers and symptoms over the past few weeks including interactions with family and stress from parenting kids who are between 2 households. The OPT therapist worked with the patient implementing elements of CBT. The OPT therapist provided psycho-education during the session. The OPT therapist worked with the patient on using her supports and communicating her feelings and focusing in areas that she has control in. The patient spoke about the conflict of holding her son accountable at her home while he is not made to do the same at his Fathers home.    Suicidal/Homicidal: Nowithout intent/plan   Therapist Response: The OPT therapist worked with the patient for the patients scheduled session. The patient  was engaged in her session and gave feedback in relation to triggers, symptoms, and behavior responses over the past few weeks. The patient has been having difficulty managing symptoms of her GAD and MDD . The OPT therapist in session worked with the patient and utilized elements of CBT in working with the patient to help empower the patient to set healthy boundaries and put her effort into areas she has control to make decisions about. The OPT therapist worked with the patient on, positive thinking, communication of her feelings and not allowing automatic negative thoughts dominate, and use her coping strategies. The patient spoke about parenting her son struggles with her physical limitations, and fallout from no longer being involved at a job she loved. The patient spoke about her plans to address the adult/child dynamics and custody as well as ideally working with the Father to better co-parent moving forward.   Plan: Return again in 2/3 weeks.   Diagnosis:      Axis I: Depression and GAD                           Axis II: No diagnosis   I discussed the assessment and treatment plan with the patient. The patient was provided an opportunity to ask questions and all were answered. The patient agreed with the plan and demonstrated an understanding of the instructions.   The patient was advised to call back or seek an in-person evaluation if the symptoms worsen or if the condition fails to improve as anticipated.   I provided 40 minutes of non-face-to-face time during this encounter.   Lennox Grumbles, LCSW   07/01/2021

## 2021-07-08 ENCOUNTER — Encounter (INDEPENDENT_AMBULATORY_CARE_PROVIDER_SITE_OTHER): Payer: Self-pay

## 2021-07-08 ENCOUNTER — Other Ambulatory Visit (INDEPENDENT_AMBULATORY_CARE_PROVIDER_SITE_OTHER): Payer: Self-pay | Admitting: Gastroenterology

## 2021-07-08 ENCOUNTER — Other Ambulatory Visit: Payer: Self-pay | Admitting: Family Medicine

## 2021-07-08 ENCOUNTER — Other Ambulatory Visit (INDEPENDENT_AMBULATORY_CARE_PROVIDER_SITE_OTHER): Payer: Self-pay

## 2021-07-08 DIAGNOSIS — D509 Iron deficiency anemia, unspecified: Secondary | ICD-10-CM

## 2021-07-08 NOTE — Telephone Encounter (Signed)
Labs ordered, thanks

## 2021-07-09 ENCOUNTER — Encounter (INDEPENDENT_AMBULATORY_CARE_PROVIDER_SITE_OTHER): Payer: Self-pay

## 2021-07-09 LAB — CBC WITH DIFFERENTIAL/PLATELET
Basophils Absolute: 0 10*3/uL (ref 0.0–0.2)
Basos: 0 %
EOS (ABSOLUTE): 0 10*3/uL (ref 0.0–0.4)
Eos: 0 %
Hematocrit: 43.9 % (ref 34.0–46.6)
Hemoglobin: 14.8 g/dL (ref 11.1–15.9)
Immature Grans (Abs): 0 10*3/uL (ref 0.0–0.1)
Immature Granulocytes: 0 %
Lymphocytes Absolute: 2.2 10*3/uL (ref 0.7–3.1)
Lymphs: 26 %
MCH: 31.4 pg (ref 26.6–33.0)
MCHC: 33.7 g/dL (ref 31.5–35.7)
MCV: 93 fL (ref 79–97)
Monocytes Absolute: 0.6 10*3/uL (ref 0.1–0.9)
Monocytes: 7 %
Neutrophils Absolute: 5.7 10*3/uL (ref 1.4–7.0)
Neutrophils: 67 %
Platelets: 303 10*3/uL (ref 150–450)
RBC: 4.71 x10E6/uL (ref 3.77–5.28)
RDW: 12.9 % (ref 11.7–15.4)
WBC: 8.4 10*3/uL (ref 3.4–10.8)

## 2021-07-09 LAB — IRON,TIBC AND FERRITIN PANEL
Ferritin: 50 ng/mL (ref 15–150)
Iron Saturation: 46 % (ref 15–55)
Iron: 166 ug/dL — ABNORMAL HIGH (ref 27–159)
Total Iron Binding Capacity: 357 ug/dL (ref 250–450)
UIBC: 191 ug/dL (ref 131–425)

## 2021-07-13 ENCOUNTER — Encounter (HOSPITAL_COMMUNITY): Payer: Self-pay | Admitting: Psychiatry

## 2021-07-13 ENCOUNTER — Telehealth (INDEPENDENT_AMBULATORY_CARE_PROVIDER_SITE_OTHER): Payer: Medicaid Other | Admitting: Psychiatry

## 2021-07-13 ENCOUNTER — Other Ambulatory Visit: Payer: Self-pay

## 2021-07-13 DIAGNOSIS — F411 Generalized anxiety disorder: Secondary | ICD-10-CM | POA: Diagnosis not present

## 2021-07-13 DIAGNOSIS — F900 Attention-deficit hyperactivity disorder, predominantly inattentive type: Secondary | ICD-10-CM | POA: Diagnosis not present

## 2021-07-13 DIAGNOSIS — F332 Major depressive disorder, recurrent severe without psychotic features: Secondary | ICD-10-CM

## 2021-07-13 MED ORDER — AMPHETAMINE-DEXTROAMPHET ER 30 MG PO CP24: 30.0000 mg | ORAL_CAPSULE | ORAL | 0 refills | Status: DC

## 2021-07-13 MED ORDER — CLONAZEPAM 0.5 MG PO TABS: 0.5000 mg | ORAL_TABLET | Freq: Two times a day (BID) | ORAL | 2 refills | Status: DC | PRN

## 2021-07-13 MED ORDER — DULOXETINE HCL 60 MG PO CPEP: 60.0000 mg | ORAL_CAPSULE | Freq: Two times a day (BID) | ORAL | 2 refills | Status: DC

## 2021-07-13 MED ORDER — BUSPIRONE HCL 10 MG PO TABS: 10.0000 mg | ORAL_TABLET | Freq: Three times a day (TID) | ORAL | 2 refills | Status: DC

## 2021-07-13 MED ORDER — AMITRIPTYLINE HCL 50 MG PO TABS: 50.0000 mg | ORAL_TABLET | Freq: Every day | ORAL | 2 refills | Status: DC

## 2021-07-13 NOTE — Progress Notes (Signed)
Virtual Visit via Video Note  I connected with Penny [name] on 07/13/21 at 11:00 AM EDT by a video enabled telemedicine application and verified that I am speaking with the correct person using two identifiers.  Location: Patient: home Provider: home office   I discussed the limitations of evaluation and management by telemedicine and the availability of in person appointments. The patient expressed understanding and agreed to proceed.     I discussed the assessment and treatment plan with the patient. The patient was provided an opportunity to ask questions and all were answered. The patient agreed with the plan and demonstrated an understanding of the instructions.   The patient was advised to call back or seek an in-person evaluation if the symptoms worsen or if the condition fails to improve as anticipated.  I provided 15 minutes of non-face-to-face time during this encounter.   [name], MD  [facility] MD/PA/NP OP Progress Note  07/13/2021 11:25 AM Penny [name]  MRN:  [id number]  Chief Complaint:  Chief Complaint   Anxiety; Depression; ADD; Follow-up    HPI: This patient is a 35 year old female who is now living alone in [place].  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  She has 2 children with whom she shares custody with their fathers. The patient returns for follow-up after 2 months.  She states that she has been more stressed recently.  Her youngest daughter has to repeat kindergarten.  She is also worried about her son who is entering the seventh grade and has been bullied about his weight.  She states that she has not been sleeping well and sometimes will go 2-3 nights without sleep.  She is on trazodone 200 mg at bedtime but does not think it helps.  Since she is still having chronic abdominal pain I suggested we try a tricyclic antidepressant such as amitriptyline and she is willing to do this.  At times she is depressed but she denies suicidal ideation.   She states that she has about 50% good days and 50% back days.  A lot of this has to do with her physical health.  She is having pseudoseizures more regularly when she is depressed.  She is seeing a therapist on a rather sporadic basis.  Visit Diagnosis:    ICD-10-CM   1. GAD (generalized anxiety disorder)  F41.1     2. Severe episode of recurrent major depressive disorder, without psychotic features ([place])  F33.2     3. Attention deficit hyperactivity disorder (ADHD), predominantly inattentive type  F90.0       Past Psychiatric History: Long-term outpatient treatment, hospitalization last year for depression  Past Medical History:  Past Medical History:  Diagnosis Date   ADD (attention deficit disorder)    Anemia 2011   2o to GASTRIC BYPASS   Anginal pain ([place])    Anxiety    Asthma    Blood transfusion without reported diagnosis    Chronic daily headache    Depression    Depression    Dysmenorrhea 09/18/2013   Dysrhythmia    Elevated liver enzymes JUL 2011ALK PHOS 111-127 AST  143-267 ALT  213-321T BILI 0.6  ALB  3.7-4.06 Jun 2011 ALK PHOS 118 AST 24 ALT 42 T BILI 0.4 ALB 3.9   Encounter for drug rehabilitation    at behavioral health for opioid addiction about 4 years ago   Family history of adverse reaction to anesthesia    'dad had to be  kept on pump for breathing for morphine'   Fatty liver    Fibroid 01/18/2017   Fibromyalgia    Gastric bypass status for obesity    Gastritis JULY 2011   Heart murmur    Hereditary and idiopathic peripheral neuropathy 01/15/2015   History of Holter monitoring    Hx of opioid abuse ([place])    for about 4 years, about 4 years ago   Interstitial cystitis    Iron deficiency anemia 07/23/2010   Irritable bowel syndrome 2012 DIARRHEA   JUN 2012 TTG IgA 14.9   IUD FEB 2010   Lupus ([place])    Menorrhagia 07/18/2013   Migraines    Obesity (BMI 30-39.9) 2011 228 LBS BMI 36.8   Osteoporosis    Ovarian cyst    Patient desires pregnancy  09/18/2013   Polyneuropathy    PONV (postoperative nausea and vomiting) seizure post-operatively   seizure following ankle surgery   Potassium (K) deficiency    Pregnant 12/25/2013   Psychiatric pseudoseizure    RLQ abdominal pain 07/18/2013   Sciatica of left side 11/14/2014   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   Stress 09/18/2013    Past Surgical History:  Procedure Laterality Date   BIOPSY  04/10/2015   Procedure: BIOPSY;  Surgeon: Daneil Dolin, MD;  Location: [facility];  Service: Endoscopy;;   BIOPSY  04/15/2021   Procedure: BIOPSY;  Surgeon: Montez Morita, Quillian Quince, MD;  [facility];  Service: Gastroenterology;;   cath self every nite     for sodium bicarb injection (discontinued 2013)   CHOLECYSTECTOMY  2005   biliary dyskinesia   COLONOSCOPY  JUN 2012 ABD PN/DIARRHEA WITH PROPOFOL   NL COLON   COLONOSCOPY WITH PROPOFOL N/A 01/16/2021   Procedure: COLONOSCOPY WITH PROPOFOL;  Surgeon: Harvel Quale, MD;  [facility];  Service: Gastroenterology;  Laterality: N/A;  1:45   DILATION AND CURETTAGE OF UTERUS     DILITATION & CURRETTAGE/HYSTROSCOPY WITH NOVASURE ABLATION N/A 03/24/2017   Procedure: DILATATION & CURETTAGE/HYSTEROSCOPY WITH NOVASURE ENDOMETRIAL ABLATION;  Surgeon: Jonnie Kind, MD;  Location: [facility];  Service: Gynecology;  Laterality: N/A;   ESOPHAGEAL DILATION N/A 04/10/2015   Procedure: ESOPHAGEAL DILATION WITH 54FR MALONEY DILATOR;  Surgeon: Daneil Dolin, MD;  Location: [facility];  Service: Endoscopy;  Laterality: N/A;   ESOPHAGOGASTRODUODENOSCOPY     ESOPHAGOGASTRODUODENOSCOPY (EGD) WITH PROPOFOL N/A 04/10/2015   Procedure: ESOPHAGOGASTRODUODENOSCOPY (EGD) WITH PROPOFOL;  Surgeon: Daneil Dolin, MD;  Location[facility];  Service: Endoscopy;  Laterality: N/A;   ESOPHAGOGASTRODUODENOSCOPY (EGD) WITH PROPOFOL N/A 12/06/2016   Procedure: ESOPHAGOGASTRODUODENOSCOPY (EGD) WITH PROPOFOL;  Surgeon: Danie Binder, MD;  [facility];  Service: Endoscopy;  Laterality: N/A;   ESOPHAGOGASTRODUODENOSCOPY (EGD) WITH PROPOFOL N/A 04/27/2019   Procedure: ESOPHAGOGASTRODUODENOSCOPY (EGD) WITH PROPOFOL;  Surgeon: Rogene Houston, MD;  [facility];  Service: Endoscopy;  Laterality: N/A;  730   ESOPHAGOGASTRODUODENOSCOPY (EGD) WITH PROPOFOL N/A 08/31/2019   Procedure: ESOPHAGOGASTRODUODENOSCOPY (EGD) WITH PROPOFOL;  Surgeon: Rogene Houston, MD;  [facility];  Service: Endoscopy;  Laterality: N/A;   ESOPHAGOGASTRODUODENOSCOPY (EGD) WITH PROPOFOL N/A 01/16/2021   Procedure: ESOPHAGOGASTRODUODENOSCOPY (EGD) WITH PROPOFOL;  Surgeon: Harvel Quale, MD;  [facility];  Service: Gastroenterology;  Laterality: N/A;   ESOPHAGOGASTRODUODENOSCOPY (EGD) WITH PROPOFOL N/A 04/15/2021   Procedure: ESOPHAGOGASTRODUODENOSCOPY (EGD) WITH PROPOFOL;  Surgeon: Harvel Quale, MD;  [facility];  Service: Gastroenterology;  Laterality: N/A;  9:00 AM   GAB  2007   in [place]-POUCH 5 CM   GASTRIC BYPASS  06/2006   HYSTEROSCOPY WITH D & C N/A 09/12/2014   Procedure: DILATATION AND CURETTAGE /HYSTEROSCOPY;  Surgeon: Jonnie Kind, MD;  Location: [facility];  Service: Gynecology;  Laterality: N/A;   KYPHOPLASTY N/A 08/02/2018   Procedure: KYPHOPLASTY T12;  Surgeon: Melina Schools, MD;  Location: [facility];  Service: Orthopedics;  Laterality: N/A;  60 mins   LAPAROSCOPIC TUBAL LIGATION Bilateral 03/24/2017   Procedure: LAPAROSCOPIC TUBAL LIGATION ([place] Rings);  Surgeon: Jonnie Kind, MD;  Location: [facility];  Service: Gynecology;  Laterality: Bilateral;   REPAIR VAGINAL CUFF N/A 07/30/2014   Procedure: REPAIR VAGINAL CUFF;  Surgeon: Mora Bellman, MD;  Location: [facility];  Service: Gynecology;  Laterality: N/A;   SAVORY DILATION  06/20/2012   Dr. Barnie Alderman gastritis/Ulcer in the mid jejunum. Empiric dilation.    SURAL NERVE BX Left 02/25/2016   Procedure: LEFT SURAL NERVE BIOPSY;  Surgeon:  Jovita Gamma, MD;  Location: [facility];  Service: Neurosurgery;  Laterality: Left;  Left sural nerve biopsy   TONSILLECTOMY     TONSILLECTOMY AND ADENOIDECTOMY     UPPER GASTROINTESTINAL ENDOSCOPY  JULY 2011 NAUSEA-D125,V6, PH 25   Bx; GASTRITIS, POUCH-5 CM LONG   WISDOM TOOTH EXTRACTION      Family Psychiatric History: see below  Family History:  Family History  Problem Relation Age of Onset   Hemochromatosis Maternal Grandmother    Migraines Maternal Grandmother    Cancer Maternal Grandmother    Breast cancer Maternal Grandmother    Hypertension Father    Diabetes Father    Coronary artery disease Father    Migraines Paternal Grandmother    Breast cancer Paternal Grandmother    Cancer Mother        breast   Hemochromatosis Mother    Breast cancer Mother    Depression Mother    Anxiety disorder Mother    Coronary artery disease Paternal Grandfather    Anxiety disorder Brother    Bipolar disorder Brother    Healthy Daughter    Healthy Son     Social History:  Social History   Socioeconomic History   Marital status: Divorced    Spouse name: Not on file   Number of children: 2   Years of education: some college   Highest education level: Some college, no degree  Occupational History   Occupation: Applying for Social Security Disability    Employer: NOT EMPLOYED  Tobacco Use   Smoking status: Former    Packs/day: 0.25    Years: 6.00    Pack years: 1.50    Types: Cigarettes    Start date: 05/2018   Smokeless tobacco: Never  Vaping Use   Vaping Use: Some days  Substance and Sexual Activity   Alcohol use: Not Currently    Alcohol/week: 0.0 standard drinks   Drug use: Not Currently   Sexual activity: Yes    Partners: Male    Birth control/protection: Surgical, Condom    Comment: tubal and ablation  Other Topics Concern   Not on file  Social History Narrative   Patient is right handed.   Patient drinks one cup of coffee daily.   She lives in a  one-story home with her two children (70 year old son, 53 month old daughter).   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 [employer]  Social Determinants of Health   Financial Resource Strain: High Risk   Difficulty of Paying Living Expenses: Very hard  Food Insecurity: No Food Insecurity   Worried About Charity fundraiser in the Last Year: Never true   Ran Out of Food in the Last Year: Never true  Transportation Needs: No Transportation Needs   Lack of Transportation (Medical): No   Lack of Transportation (Non-Medical): No  Physical Activity: Inactive   Days of Exercise per Week: 0 days   Minutes of Exercise per Session: 0 min  Stress: Stress Concern Present   Feeling of Stress : Very much  Social Connections: Moderately Isolated   Frequency of Communication with Friends and Family: More than three times a week   Frequency of Social Gatherings with Friends and Family: More than three times a week   Attends Religious Services: More than 4 times per year   Active Member of [employer] or Organizations: No   Attends Archivist Meetings: Never   Marital Status: Divorced    Allergies:  Allergies  Allergen Reactions   Gabapentin Other (See Comments)    Pt states that she was unresponsive after taking this medication, but her vitals remained stable.     Metoclopramide Hcl Anxiety and Other (See Comments)    Pt states that she felt like she was trapped in a box, and could not get out.  Pt also states that she had temporary loss of movement, weakness, and tingling.     Tramadol Other (See Comments)    Seizures   Ativan [Lorazepam] Other (See Comments)    combative   Latex Itching, Rash and Other (See Comments)    Burns   Lyrica [Pregabalin] Other (See Comments)    suicidal   Iron Other (See Comments)    PATIENT IS INTOLERANT TO ORAL IRON , SHE DOES NOT ABSORB IT.     Sulfa Antibiotics Hives   Butrans [Buprenorphine] Rash    Patch caused  rash, didn't help with pain   Nucynta [Tapentadol] Nausea And Vomiting   Propofol Nausea And Vomiting   Tape Hives, Itching and Rash    Please use paper tape   Toradol [Ketorolac Tromethamine] Anxiety    Metabolic Disorder Labs: Lab Results  Component Value Date   HGBA1C 5.7 (H) 01/15/2015   Lab Results  Component Value Date   PROLACTIN 80.0 (H) 05/09/2015   Lab Results  Component Value Date   CHOL 95 03/10/2012   TRIG 50 03/10/2012   HDL 38 (L) 03/10/2012   CHOLHDL 2.5 03/10/2012   VLDL 10 03/10/2012   LDLCALC 47 03/10/2012   Lab Results  Component Value Date   TSH 1.350 01/01/2021   TSH 2.616 08/25/2019    Therapeutic Level Labs: No results found for: LITHIUM Lab Results  Component Value Date   VALPROATE 66 06/23/2020   No components found for:  CBMZ  Current Medications: Current Outpatient Medications  Medication Sig Dispense Refill   amitriptyline (ELAVIL) 50 MG tablet Take 1 tablet (50 mg total) by mouth at bedtime. 30 tablet 2   amphetamine-dextroamphetamine (ADDERALL XR) 30 MG 24 hr capsule Take 1 capsule (30 mg total) by mouth every morning. 30 capsule 0   amphetamine-dextroamphetamine (ADDERALL XR) 30 MG 24 hr capsule Take 1 capsule (30 mg total) by mouth every morning. 30 capsule 0   busPIRone (BUSPAR) 10 MG tablet Take 1 tablet (10 mg total) by mouth 3 (three) times daily. 90 tablet 2   Calcium Carb-Cholecalciferol (  CALCIUM-VITAMIN D3) 600-500 MG-UNIT CAPS Take 1 tablet by mouth daily.     Carboxymethylcellul-Glycerin (LUBRICATING EYE DROPS OP) Place 1 drop into both eyes daily as needed (dry eyes).     cetirizine (ZYRTEC) 10 MG tablet TAKE ONE TABLET BY MOUTH DAILY (Patient taking differently: Take 10 mg by mouth daily.) 30 tablet 5   clidinium-chlordiazePOXIDE (LIBRAX) 5-2.5 MG capsule Take 1 capsule by mouth 3 (three) times daily before meals. 90 capsule 5   clonazePAM (KLONOPIN) 0.5 MG tablet Take 1 tablet (0.5 mg total) by mouth 2 (two) times daily  as needed. 60 tablet 2   cyclobenzaprine (FLEXERIL) 10 MG tablet Take 1 tablet (10 mg total) by mouth 3 (three) times daily as needed for muscle spasms. 30 tablet 0   DULoxetine (CYMBALTA) 60 MG capsule Take 1 capsule (60 mg total) by mouth 2 (two) times daily. 180 capsule 2   fluconazole (DIFLUCAN) 150 MG tablet One po qd prn yeast infection; may repeat in 3-4 days if needed (Patient taking differently: Take 150 mg by mouth See admin instructions. One po qd prn yeast infection; may repeat in 3-4 days if needed) 2 tablet 0   fluticasone (FLOVENT HFA) 220 MCG/ACT inhaler Inhale 2 puffs into the lungs 2 (two) times daily. Do not inhale, swallow powder and drink water , do not eat food for 20 minutes after puffs 4 each 2   HYDROmorphone (DILAUDID) 4 MG tablet Take 4 mg by mouth every 4 (four) hours as needed for severe pain.     lansoprazole (PREVACID) 30 MG capsule Take 1 capsule (30 mg total) by mouth daily. 180 capsule 1   levalbuterol (XOPENEX HFA) 45 MCG/ACT inhaler INHALE (2) PUFFS INTO THE LUNGS EVERY SIX HOURS AS NEEDED FOR WHEEZING. (Patient taking differently: Inhale 2 puffs into the lungs every 6 (six) hours as needed for wheezing or shortness of breath. INHALE (2) PUFFS INTO THE LUNGS EVERY SIX HOURS AS NEEDED FOR WHEEZING.) 15 g 3   levalbuterol (XOPENEX) 1.25 MG/3ML nebulizer solution Take 1.25 mg by nebulization every 4 (four) hours as needed for wheezing. 50 mL 3   linaclotide (LINZESS) 290 MCG CAPS capsule Take 1 capsule (290 mcg total) by mouth daily before breakfast. 90 capsule 3   meclizine (ANTIVERT) 25 MG tablet Take 1 tablet (25 mg total) by mouth 3 (three) times daily as needed for dizziness. 30 tablet 0   naloxegol oxalate (MOVANTIK) 12.5 MG TABS tablet Take 2 tablets (25 mg total) by mouth daily. 90 tablet 3   ondansetron (ZOFRAN) 8 MG tablet Take 1 tablet (8 mg total) by mouth every 8 (eight) hours as needed for nausea. 12 tablet 3   pantoprazole (PROTONIX) 40 MG tablet TAKE 1  TABLET BY MOUTH TWICE DAILY BEFORE MEALS. 60 tablet 2   Pediatric Multivit-Minerals-C (FLINTSTONES GUMMIES) chewable tablet Chew 3 tablets by mouth daily.     promethazine (PHENERGAN) 12.5 MG tablet TAKE 1 TABLET BY MOUTH EVERY 8 HOURS AS NEEDED FOR NAUSEA OR VOMITING (Patient taking differently: Take 12.5 mg by mouth every 8 (eight) hours as needed for nausea or vomiting.) 30 tablet 0   promethazine (PHENERGAN) 25 MG tablet Take 25 mg by mouth 2 (two) times daily as needed for nausea or vomiting.     rOPINIRole (REQUIP) 1 MG tablet Take one tablet po each evening. (Patient taking differently: Take 1 mg by mouth at bedtime.) 30 tablet 5   sucralfate (CARAFATE) 1 g tablet Take 1 tablet (1 g total) by  mouth 4 (four) times daily -  with meals and at bedtime. 180 tablet 3   valACYclovir (VALTREX) 1000 MG tablet TAKE 1 TABLET BY MOUTH DAILY. (Patient taking differently: Take 1,000 mg by mouth daily as needed (fever blisters).) 90 tablet PRN   Vitamin D, Ergocalciferol, (DRISDOL) 1.25 MG (50000 UNIT) CAPS capsule Take 50,000 Units by mouth every Wednesday.     No current facility-administered medications for this visit.     Musculoskeletal: Strength & Muscle Tone: within normal limits Gait & Station: normal Patient leans: N/A  Psychiatric Specialty Exam: Review of Systems  Gastrointestinal:  Positive for abdominal pain.  Psychiatric/Behavioral:  Positive for dysphoric mood and sleep disturbance.   All other systems reviewed and are negative.  There were no vitals taken for this visit.There is no height or weight on file to calculate BMI.  General Appearance: Casual and Fairly Groomed  Eye Contact:  Good  Speech:  Clear and Coherent  Volume:  Normal  Mood:  Anxious  Affect:  Appropriate and Congruent  Thought Process:  Goal Directed  Orientation:  Full (Time, Place, and Person)  Thought Content: Rumination   Suicidal Thoughts:  No  Homicidal Thoughts:  No  Memory:  Immediate;    Good Recent;   Good Remote;   Good  Judgement:  Fair  Insight:  Fair  Psychomotor Activity:  Normal  Concentration:  Concentration: Good and Attention Span: Good  Recall:  Good  Fund of Knowledge: Good  Language: Good  Akathisia:  No  Handed:  Right  AIMS (if indicated): not done  Assets:  Communication Skills Desire for Improvement Resilience Social Support  ADL's:  Intact  Cognition: WNL  Sleep:  Poor   Screenings: AIMS    Flowsheet Row Admission (Discharged) from 06/19/2020 in [facility]  AIMS Total Score 0      AUDIT    Flowsheet Row Patient Outreach Telephone from 07/14/2020 in [facility] Admission (Discharged) from 06/19/2020 in [facility] ED to Hosp-Admission (Discharged) from 05/09/2015 in [facility]  Alcohol Use Disorder Identification Test Final Score (AUDIT) 1 1 0      GAD-7    Flowsheet Row Counselor from 12/11/2018 in [facility] Counselor from 02/07/2017 in [facility]  Total GAD-7 Score 19 17      PHQ2-9    Flowsheet Row Video Visit from 07/13/2021 in [facility] Video Visit from 05/12/2021 in [facility] Video Visit from 03/12/2021 in [facility] from [facility]/14/2021 [facility] Patient Outreach Telephone from 07/14/2020 in [facility]  PHQ-2 Total Score 4 1 0 6 4  PHQ-9 Total Score 5 -- -- 15 19      Flowsheet Row Video Visit from 07/13/2021 in [facility] Video Visit from 05/12/2021 in [facility] ED from 05/07/2021 in [facility] at  [place] No Risk No Risk No Risk        Assessment and Plan: This patient is a 35 year old female with a history of depression anxiety ADHD borderline personality disorder insomnia and pseudoseizures.  She is not sleeping well so we will discontinue trazodone in favor of amitriptyline 50 mg at bedtime which may also help with her chronic pain.  She will continue Cymbalta  120 mg daily for depression, BuSpar 10 mg 3 times daily for anxiety, clonazepam 0.5 mg twice daily as needed for anxiety and Adderall XR 30 mg every morning for focus.  She will return to see me in 2 months   [name], MD 07/13/2021, 11:26 AM

## 2021-07-15 ENCOUNTER — Other Ambulatory Visit: Payer: Self-pay

## 2021-07-15 ENCOUNTER — Ambulatory Visit (INDEPENDENT_AMBULATORY_CARE_PROVIDER_SITE_OTHER): Payer: Medicaid Other | Admitting: Clinical

## 2021-07-15 DIAGNOSIS — F332 Major depressive disorder, recurrent severe without psychotic features: Secondary | ICD-10-CM | POA: Diagnosis not present

## 2021-07-15 DIAGNOSIS — F411 Generalized anxiety disorder: Secondary | ICD-10-CM

## 2021-07-15 NOTE — Progress Notes (Signed)
Virtual Visit via Video Note  I connected with Penny [name] on 07/15/21 at  1:00 PM EDT by a video enabled telemedicine application and verified that I am speaking with the correct person using two identifiers.  Location: Patient: Home Provider: Office   I discussed the limitations of evaluation and management by telemedicine and the availability of in person appointments. The patient expressed understanding and agreed to proceed.  THERAPIST PROGRESS NOTE   Session Time: 1:00PM-1:30PM   Participation Level: Active   Behavioral Response: CasualAlertDepressed and NA   Type of Therapy: Individual Therapy   Treatment Goals addressed: Coping   Interventions: CBT, Motivational Interviewing, Strength-based and Supportive   Summary: Penny [name] is a 35 y.o. female who presents with GAD and MDD. The OPT therapist worked with the patient for her ongoing OPT treatment session. The OPT therapist utilized Motivational Interviewing to assist in creating therapeutic repore. The patient in the session was engaged and work in collaboration giving feedback about her triggers and symptoms over the past few weeks. The patient spoke about a upcoming beach trip, stress from parenting kids who are between 2 households, caregiving for family, and stress around kids returning to school. The OPT therapist worked with the patient implementing elements of CBT. The OPT therapist provided psycho-education during the session. The OPT therapist worked with the patient on using her supports and communicating her feelings and focusing in areas that she has control in.   Suicidal/Homicidal: Nowithout intent/plan   Therapist Response: The OPT therapist worked with the patient for the patients scheduled session. The patient was engaged in her session and gave feedback in relation to triggers, symptoms, and behavior responses over the past few weeks. The patient has been having difficulty managing symptoms of her GAD and MDD  . The OPT therapist in session worked with the patient and utilized elements of CBT in working with the patient to help empower the patient to set healthy boundaries and put her effort into areas she has control to make decisions about. The OPT therapist worked with the patient on, positive thinking, communication of her feelings and not allowing automatic negative thoughts dominate, and use her coping strategies. . The patient spoke about her plans to break down task and take more breaks so that she doesn't get overwhelmed.   Plan: Return again in 2/3 weeks.   Diagnosis:      Axis I: Depression and GAD                           Axis II: No diagnosis   I discussed the assessment and treatment plan with the patient. The patient was provided an opportunity to ask questions and all were answered. The patient agreed with the plan and demonstrated an understanding of the instructions.   The patient was advised to call back or seek an in-person evaluation if the symptoms worsen or if the condition fails to improve as anticipated.   I provided 30 minutes of non-face-to-face time during this encounter.   Lennox Grumbles, LCSW   07/15/2021

## 2021-07-16 DIAGNOSIS — Z79899 Other long term (current) drug therapy: Secondary | ICD-10-CM | POA: Diagnosis not present

## 2021-07-16 DIAGNOSIS — M546 Pain in thoracic spine: Secondary | ICD-10-CM | POA: Diagnosis not present

## 2021-07-20 DIAGNOSIS — Z79899 Other long term (current) drug therapy: Secondary | ICD-10-CM | POA: Diagnosis not present

## 2021-07-23 ENCOUNTER — Telehealth: Payer: Self-pay | Admitting: Neurology

## 2021-07-23 NOTE — Telephone Encounter (Signed)
Patient called with numbness and swelling on her right leg below the knee. She can't put any weight on it, she said.  She'd like an appointment with Dr. Posey Pronto about this. Sending to clinical staff for scheduling recommendations/ urgency.

## 2021-07-23 NOTE — Telephone Encounter (Signed)
Patient called again about this problem.

## 2021-07-24 ENCOUNTER — Telehealth: Payer: Medicaid Other | Admitting: Nurse Practitioner

## 2021-07-24 ENCOUNTER — Encounter: Payer: Self-pay | Admitting: Family Medicine

## 2021-07-24 DIAGNOSIS — M545 Low back pain, unspecified: Secondary | ICD-10-CM

## 2021-07-24 MED ORDER — NAPROXEN 500 MG PO TABS: 500.0000 mg | ORAL_TABLET | Freq: Two times a day (BID) | ORAL | 1 refills | Status: DC

## 2021-07-24 MED ORDER — CYCLOBENZAPRINE HCL 10 MG PO TABS: 10.0000 mg | ORAL_TABLET | Freq: Three times a day (TID) | ORAL | 1 refills | Status: DC | PRN

## 2021-07-24 NOTE — Telephone Encounter (Signed)
I have responded to patients mychart message and informed her that I have forwarded her message.

## 2021-07-24 NOTE — Telephone Encounter (Signed)
1.  Unfortunately our office is full today #2 with the complexity of the symptoms she is stating I would recommend ER over urgent care

## 2021-07-24 NOTE — Progress Notes (Signed)
We are sorry that you are not feeling well.  Here is how we plan to help!  Based on what you have shared with me it looks like you mostly have acute back pain.  Acute back pain is defined as musculoskeletal pain that can resolve in 1-3 weeks with conservative treatment.  I have prescribed Naprosyn 500 mg take one by mouth twice a day non-steroid anti-inflammatory (NSAID) as well as Flexeril 10 mg every eight hours as needed which is a muscle relaxer  Some patients experience stomach irritation or in increased heartburn with anti-inflammatory drugs.  Please keep in mind that muscle relaxer's can cause fatigue and should not be taken while at work or driving.  Back pain is very common.  The pain often gets better over time.  The cause of back pain is usually not dangerous.  Most people can learn to manage their back pain on their own.  Home Care  Stay active.  Start with short walks on flat ground if you can.  Try to walk farther each day.  Do not sit, drive or stand in one place for more than 30 minutes.  Do not stay in bed.  Do not avoid exercise or work.  Activity can help your back heal faster.  Be careful when you bend or lift an object.  Bend at your knees, keep the object close to you, and do not twist.  Sleep on a firm mattress.  Lie on your side, and bend your knees.  If you lie on your back, put a pillow under your knees.  Only take medicines as told by your doctor.  Put ice on the injured area.  Put ice in a plastic bag  Place a towel between your skin and the bag  Leave the ice on for 15-20 minutes, 3-4 times a day for the first 2-3 days. 210 After that, you can switch between ice and heat packs.  Ask your doctor about back exercises or massage.  Avoid feeling anxious or stressed.  Find good ways to deal with stress, such as exercise.  Get Help Right Way If:  Your pain does not go away with rest or medicine.  Your pain does not go away in 1 week.  You have new  problems.  You do not feel well.  The pain spreads into your legs.  You cannot control when you poop (bowel movement) or pee (urinate)  You feel sick to your stomach (nauseous) or throw up (vomit)  You have belly (abdominal) pain.  You feel like you may pass out (faint).  If you develop a fever.  Make Sure you:  Understand these instructions.  Will watch your condition  Will get help right away if you are not doing well or get worse.  Your e-visit answers were reviewed by a board certified advanced clinical practitioner to complete your personal care plan.  Depending on the condition, your plan could have included both over the counter or prescription medications.  If there is a problem please reply  once you have received a response from your provider.  Your safety is important to us.  If you have drug allergies check your prescription carefully.    You can use MyChart to ask questions about today's visit, request a non-urgent call back, or ask for a work or school excuse for 24 hours related to this e-Visit. If it has been greater than 24 hours you will need to follow up with your provider, or enter a   new e-Visit to address those concerns.  You will get an e-mail in the next two days asking about your experience.  I hope that your e-visit has been valuable and will speed your recovery. Thank you for using e-visits.  5-10 minutes spent reviewing and documenting in chart.  

## 2021-07-24 NOTE — Telephone Encounter (Signed)
Patient has been informed via Mychart

## 2021-07-28 ENCOUNTER — Ambulatory Visit
Admission: RE | Admit: 2021-07-28 | Discharge: 2021-07-28 | Disposition: A | Discharge status: Home / Self Care | Payer: Medicaid Other | Source: Ambulatory Visit | Attending: Emergency Medicine | Admitting: Emergency Medicine

## 2021-07-28 ENCOUNTER — Emergency Department (HOSPITAL_COMMUNITY)
Admission: EM | Admit: 2021-07-28 | Discharge: 2021-07-29 | Disposition: A | Discharge status: Home / Self Care | Payer: Medicaid Other | Attending: Emergency Medicine | Admitting: Emergency Medicine

## 2021-07-28 ENCOUNTER — Emergency Department (HOSPITAL_COMMUNITY): Payer: Medicaid Other

## 2021-07-28 ENCOUNTER — Ambulatory Visit: Payer: Medicaid Other

## 2021-07-28 ENCOUNTER — Other Ambulatory Visit: Payer: Self-pay

## 2021-07-28 ENCOUNTER — Encounter (HOSPITAL_COMMUNITY): Payer: Self-pay | Admitting: *Deleted

## 2021-07-28 VITALS — BP 91/61 | HR 94 | Temp 99.2°F | Resp 16

## 2021-07-28 DIAGNOSIS — Z7951 Long term (current) use of inhaled steroids: Secondary | ICD-10-CM | POA: Diagnosis not present

## 2021-07-28 DIAGNOSIS — M545 Low back pain, unspecified: Secondary | ICD-10-CM | POA: Diagnosis not present

## 2021-07-28 DIAGNOSIS — Z91040 Latex allergy status: Secondary | ICD-10-CM | POA: Insufficient documentation

## 2021-07-28 DIAGNOSIS — R208 Other disturbances of skin sensation: Secondary | ICD-10-CM | POA: Diagnosis not present

## 2021-07-28 DIAGNOSIS — Z87891 Personal history of nicotine dependence: Secondary | ICD-10-CM | POA: Insufficient documentation

## 2021-07-28 DIAGNOSIS — R29818 Other symptoms and signs involving the nervous system: Secondary | ICD-10-CM | POA: Diagnosis not present

## 2021-07-28 DIAGNOSIS — R202 Paresthesia of skin: Secondary | ICD-10-CM | POA: Insufficient documentation

## 2021-07-28 DIAGNOSIS — N9489 Other specified conditions associated with female genital organs and menstrual cycle: Secondary | ICD-10-CM | POA: Diagnosis not present

## 2021-07-28 DIAGNOSIS — R413 Other amnesia: Secondary | ICD-10-CM | POA: Diagnosis not present

## 2021-07-28 DIAGNOSIS — R29898 Other symptoms and signs involving the musculoskeletal system: Secondary | ICD-10-CM

## 2021-07-28 DIAGNOSIS — J45909 Unspecified asthma, uncomplicated: Secondary | ICD-10-CM | POA: Insufficient documentation

## 2021-07-28 DIAGNOSIS — R200 Anesthesia of skin: Secondary | ICD-10-CM | POA: Diagnosis not present

## 2021-07-28 DIAGNOSIS — J323 Chronic sphenoidal sinusitis: Secondary | ICD-10-CM | POA: Diagnosis not present

## 2021-07-28 DIAGNOSIS — R531 Weakness: Secondary | ICD-10-CM | POA: Diagnosis not present

## 2021-07-28 DIAGNOSIS — M62561 Muscle wasting and atrophy, not elsewhere classified, right lower leg: Secondary | ICD-10-CM | POA: Diagnosis not present

## 2021-07-28 DIAGNOSIS — J341 Cyst and mucocele of nose and nasal sinus: Secondary | ICD-10-CM | POA: Diagnosis not present

## 2021-07-28 DIAGNOSIS — Z20822 Contact with and (suspected) exposure to covid-19: Secondary | ICD-10-CM | POA: Diagnosis not present

## 2021-07-28 LAB — CBC WITH DIFFERENTIAL/PLATELET
Abs Immature Granulocytes: 0.01 10*3/uL (ref 0.00–0.07)
Basophils Absolute: 0 10*3/uL (ref 0.0–0.1)
Basophils Relative: 1 %
Eosinophils Absolute: 0.1 10*3/uL (ref 0.0–0.5)
Eosinophils Relative: 2 %
HCT: 39.2 % (ref 36.0–46.0)
Hemoglobin: 12.9 g/dL (ref 12.0–15.0)
Immature Granulocytes: 0 %
Lymphocytes Relative: 31 %
Lymphs Abs: 2.1 10*3/uL (ref 0.7–4.0)
MCH: 31.7 pg (ref 26.0–34.0)
MCHC: 32.9 g/dL (ref 30.0–36.0)
MCV: 96.3 fL (ref 80.0–100.0)
Monocytes Absolute: 0.6 10*3/uL (ref 0.1–1.0)
Monocytes Relative: 8 %
Neutro Abs: 3.8 10*3/uL (ref 1.7–7.7)
Neutrophils Relative %: 58 %
Platelets: 278 10*3/uL (ref 150–400)
RBC: 4.07 MIL/uL (ref 3.87–5.11)
RDW: 13 % (ref 11.5–15.5)
WBC: 6.6 10*3/uL (ref 4.0–10.5)
nRBC: 0 % (ref 0.0–0.2)

## 2021-07-28 LAB — COMPREHENSIVE METABOLIC PANEL
ALT: 28 U/L (ref 0–44)
AST: 25 U/L (ref 15–41)
Albumin: 3.1 g/dL — ABNORMAL LOW (ref 3.5–5.0)
Alkaline Phosphatase: 72 U/L (ref 38–126)
Anion gap: <3 — ABNORMAL LOW (ref 5–15)
BUN: 16 mg/dL (ref 6–20)
CO2: 29 mmol/L (ref 22–32)
Calcium: 8.4 mg/dL — ABNORMAL LOW (ref 8.9–10.3)
Chloride: 103 mmol/L (ref 98–111)
Creatinine, Ser: 0.41 mg/dL — ABNORMAL LOW (ref 0.44–1.00)
GFR, Estimated: >60 mL/min (ref >60)
Glucose, Bld: 129 mg/dL — ABNORMAL HIGH (ref 70–99)
Potassium: 4 mmol/L (ref 3.5–5.1)
Sodium: 134 mmol/L — ABNORMAL LOW (ref 135–145)
Total Bilirubin: 0.4 mg/dL (ref 0.3–1.2)
Total Protein: 5.9 g/dL — ABNORMAL LOW (ref 6.5–8.1)

## 2021-07-28 LAB — MAGNESIUM: Magnesium: 2.2 mg/dL (ref 1.7–2.4)

## 2021-07-28 LAB — I-STAT BETA HCG BLOOD, ED (MC, WL, AP ONLY): I-stat hCG, quantitative: <5 m[IU]/mL (ref <5)

## 2021-07-28 NOTE — ED Triage Notes (Signed)
Patient sent from urgent care to rule out stroke. States she has not been able for a week. States she has used a walker and a wheelchair. States her mother encouraged her to go to urgent care today, weakness is in right leg

## 2021-07-28 NOTE — ED Notes (Signed)
Pt with complaints of pain that radiates from her lower back down her right leg that began a week ago along with the weakness to the right leg. Pt also states she has been having "difficulty remembering for around the same amount of time".

## 2021-07-28 NOTE — ED Triage Notes (Signed)
Pt reports Pain management DR has prescription for tizanidine every 4 hrs.

## 2021-07-28 NOTE — ED Provider Notes (Signed)
HPI  SUBJECTIVE:  Penny [name] is a 35 y.o. female who presents with 1 week of constant, daily right calf numbness.  She reports tight pain with standing.  She states that she now requires a walker in order to ambulate because she cannot feel her foot is in space and because of pain/right lower extremity weakness.  No limitation of motion of the knee, ankle, toes.  No new or different back pain.  No trauma to her leg or back.  She reports having a headache was different than her usual migraines while at the beach, but states that she was having the calf numbness prior to the headache.  She denies slurred speech, arm weakness, visual changes.  She has been taking 2 mg of Zanaflex every 4 hours and her Dilaudid as prescribed to her without improvement in her symptoms.  Symptoms are worse with standing, hanging her leg in a dependent position, trying to walk.  She has had similar symptoms before after she had her daughter, where she woke up with numbness over the entire left side of her body that lasted a year.  No cause was ever found.  She has a past medical history of peripheral neuropathy , vitamin D, iron deficiency, migraine.  No history of diabetes, hypertension, PAD/PVD, CVA, atrial fibrillation.  Patient has contacted both her neurologist and PMD, and was recommended to go to the ED on 8/19.  Past Medical History:  Diagnosis Date   ADD (attention deficit disorder)    Anemia 2011   2o to GASTRIC BYPASS   Anginal pain ([place])    Anxiety    Asthma    Blood transfusion without reported diagnosis    Chronic daily headache    Depression    Depression    Dysmenorrhea 09/18/2013   Dysrhythmia    Elevated liver enzymes JUL 2011ALK PHOS 111-127 AST  143-267 ALT  213-321T BILI 0.6  ALB  3.7-4.06 Jun 2011 ALK PHOS 118 AST 24 ALT 42 T BILI 0.4 ALB 3.9   Encounter for drug rehabilitation    at behavioral health for opioid addiction about 4 years ago   Family history of adverse reaction to  anesthesia    'dad had to be kept on pump for breathing for morphine'   Fatty liver    Fibroid 01/18/2017   Fibromyalgia    Gastric bypass status for obesity    Gastritis JULY 2011   Heart murmur    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   History of Holter monitoring    Hx of opioid abuse ([place])    for about 4 years, about 4 years ago   Interstitial cystitis    Iron deficiency anemia 07/23/2010   Irritable bowel syndrome 2012 DIARRHEA   JUN 2012 TTG IgA 14.9   IUD FEB 2010   Lupus ([place])    Menorrhagia 07/18/2013   Migraines    Obesity (BMI 30-39.9) 2011 228 LBS BMI 36.8   Osteoporosis    Ovarian cyst    Patient desires pregnancy 09/18/2013   Polyneuropathy    PONV (postoperative nausea and vomiting) seizure post-operatively   seizure following ankle surgery   Potassium (K) deficiency    Pregnant 12/25/2013   Psychiatric pseudoseizure    RLQ abdominal pain 07/18/2013   Sciatica of left side 11/14/2014   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   Stress 09/18/2013    Past Surgical History:  Procedure Laterality Date   BIOPSY  04/10/2015   Procedure:  BIOPSY;  Surgeon: Daneil Dolin, MD;  Location: [facility];  Service: Endoscopy;;   BIOPSY  04/15/2021   Procedure: BIOPSY;  Surgeon: Montez Morita, Quillian Quince, MD;  [facility];  Service: Gastroenterology;;   cath self every nite     for sodium bicarb injection (discontinued 2013)   CHOLECYSTECTOMY  2005   biliary dyskinesia   COLONOSCOPY  JUN 2012 ABD PN/DIARRHEA WITH PROPOFOL   NL COLON   COLONOSCOPY WITH PROPOFOL N/A 01/16/2021   Procedure: COLONOSCOPY WITH PROPOFOL;  Surgeon: Harvel Quale, MD;  [facility];  Service: Gastroenterology;  Laterality: N/A;  1:45   DILATION AND CURETTAGE OF UTERUS     DILITATION & CURRETTAGE/HYSTROSCOPY WITH NOVASURE ABLATION N/A 03/24/2017   Procedure: DILATATION & CURETTAGE/HYSTEROSCOPY WITH NOVASURE ENDOMETRIAL ABLATION;  Surgeon: Jonnie Kind, MD;  Location: [facility];  Service: Gynecology;  Laterality: N/A;   ESOPHAGEAL DILATION N/A 04/10/2015   Procedure: ESOPHAGEAL DILATION WITH 54FR MALONEY DILATOR;  Surgeon: Daneil Dolin, MD;  Location: [facility];  Service: Endoscopy;  Laterality: N/A;   ESOPHAGOGASTRODUODENOSCOPY     ESOPHAGOGASTRODUODENOSCOPY (EGD) WITH PROPOFOL N/A 04/10/2015   Procedure: ESOPHAGOGASTRODUODENOSCOPY (EGD) WITH PROPOFOL;  Surgeon: Daneil Dolin, MD;  [facility];  Service: Endoscopy;  Laterality: N/A;   ESOPHAGOGASTRODUODENOSCOPY (EGD) WITH PROPOFOL N/A 12/06/2016   Procedure: ESOPHAGOGASTRODUODENOSCOPY (EGD) WITH PROPOFOL;  Surgeon: Danie Binder, MD;  [facility];  Service: Endoscopy;  Laterality: N/A;   ESOPHAGOGASTRODUODENOSCOPY (EGD) WITH PROPOFOL N/A 04/27/2019   Procedure: ESOPHAGOGASTRODUODENOSCOPY (EGD) WITH PROPOFOL;  Surgeon: Rogene Houston, MD;  [facility];  Service: Endoscopy;  Laterality: N/A;  730   ESOPHAGOGASTRODUODENOSCOPY (EGD) WITH PROPOFOL N/A 08/31/2019   Procedure: ESOPHAGOGASTRODUODENOSCOPY (EGD) WITH PROPOFOL;  Surgeon: Rogene Houston, MD;  [facility];  Service: Endoscopy;  Laterality: N/A;   ESOPHAGOGASTRODUODENOSCOPY (EGD) WITH PROPOFOL N/A 01/16/2021   Procedure: ESOPHAGOGASTRODUODENOSCOPY (EGD) WITH PROPOFOL;  Surgeon: Harvel Quale, MD;  [facility];  Service: Gastroenterology;  Laterality: N/A;   ESOPHAGOGASTRODUODENOSCOPY (EGD) WITH PROPOFOL N/A 04/15/2021   Procedure: ESOPHAGOGASTRODUODENOSCOPY (EGD) WITH PROPOFOL;  Surgeon: Harvel Quale, MD;  [facility];  Service: Gastroenterology;  Laterality: N/A;  9:00 AM   GAB  2007   in [place]-POUCH 5 CM   GASTRIC BYPASS  06/2006   HYSTEROSCOPY WITH D & C N/A 09/12/2014   Procedure: DILATATION AND CURETTAGE /HYSTEROSCOPY;  Surgeon: Jonnie Kind, MD;  Location: [facility];  Service: Gynecology;  Laterality: N/A;   KYPHOPLASTY N/A 08/02/2018    Procedure: KYPHOPLASTY T12;  Surgeon: Melina Schools, MD;  Location: [facility];  Service: Orthopedics;  Laterality: N/A;  60 mins   LAPAROSCOPIC TUBAL LIGATION Bilateral 03/24/2017   Procedure: LAPAROSCOPIC TUBAL LIGATION ([place] Rings);  Surgeon: Jonnie Kind, MD;  Location: [facility];  Service: Gynecology;  Laterality: Bilateral;   REPAIR VAGINAL CUFF N/A 07/30/2014   Procedure: REPAIR VAGINAL CUFF;  Surgeon: Mora Bellman, MD;  Location: [facility];  Service: Gynecology;  Laterality: N/A;   SAVORY DILATION  06/20/2012   Dr. Barnie Alderman gastritis/Ulcer in the mid jejunum. Empiric dilation.    SURAL NERVE BX Left 02/25/2016   Procedure: LEFT SURAL NERVE BIOPSY;  Surgeon: Jovita Gamma, MD;  Location: [facility];  Service: Neurosurgery;  Laterality: Left;  Left sural nerve biopsy   TONSILLECTOMY     TONSILLECTOMY AND ADENOIDECTOMY     UPPER GASTROINTESTINAL ENDOSCOPY  JULY 2011 NAUSEA-D125,V6, PH 25  Bx; GASTRITIS, POUCH-5 CM LONG   WISDOM TOOTH EXTRACTION      Family History  Problem Relation Age of Onset   Hemochromatosis Maternal Grandmother    Migraines Maternal Grandmother    Cancer Maternal Grandmother    Breast cancer Maternal Grandmother    Hypertension Father    Diabetes Father    Coronary artery disease Father    Migraines Paternal Grandmother    Breast cancer Paternal Grandmother    Cancer Mother        breast   Hemochromatosis Mother    Breast cancer Mother    Depression Mother    Anxiety disorder Mother    Coronary artery disease Paternal Grandfather    Anxiety disorder Brother    Bipolar disorder Brother    Healthy Daughter    Healthy Son     Social History   Tobacco Use   Smoking status: Former    Packs/day: 0.25    Years: 6.00    Pack years: 1.50    Types: Cigarettes    Start date: 05/2018   Smokeless tobacco: Never  Vaping Use   Vaping Use: Some days  Substance Use Topics   Alcohol use: Not Currently    Alcohol/week: 0.0 standard drinks   Drug use:  Not Currently    No current facility-administered medications for this encounter.  Current Outpatient Medications:    amitriptyline (ELAVIL) 50 MG tablet, Take 1 tablet (50 mg total) by mouth at bedtime., Disp: 30 tablet, Rfl: 2   amphetamine-dextroamphetamine (ADDERALL XR) 30 MG 24 hr capsule, Take 1 capsule (30 mg total) by mouth every morning., Disp: 30 capsule, Rfl: 0   amphetamine-dextroamphetamine (ADDERALL XR) 30 MG 24 hr capsule, Take 1 capsule (30 mg total) by mouth every morning., Disp: 30 capsule, Rfl: 0   busPIRone (BUSPAR) 10 MG tablet, Take 1 tablet (10 mg total) by mouth 3 (three) times daily., Disp: 90 tablet, Rfl: 2   Calcium Carb-Cholecalciferol (CALCIUM-VITAMIN D3) 600-500 MG-UNIT CAPS, Take 1 tablet by mouth daily., Disp: , Rfl:    Carboxymethylcellul-Glycerin (LUBRICATING EYE DROPS OP), Place 1 drop into both eyes daily as needed (dry eyes)., Disp: , Rfl:    cetirizine (ZYRTEC) 10 MG tablet, TAKE ONE TABLET BY MOUTH DAILY (Patient taking differently: Take 10 mg by mouth daily.), Disp: 30 tablet, Rfl: 5   clidinium-chlordiazePOXIDE (LIBRAX) 5-2.5 MG capsule, Take 1 capsule by mouth 3 (three) times daily before meals., Disp: 90 capsule, Rfl: 5   clonazePAM (KLONOPIN) 0.5 MG tablet, Take 1 tablet (0.5 mg total) by mouth 2 (two) times daily as needed., Disp: 60 tablet, Rfl: 2   cyclobenzaprine (FLEXERIL) 10 MG tablet, Take 1 tablet (10 mg total) by mouth 3 (three) times daily as needed for muscle spasms., Disp: 30 tablet, Rfl: 1   DULoxetine (CYMBALTA) 60 MG capsule, Take 1 capsule (60 mg total) by mouth 2 (two) times daily., Disp: 180 capsule, Rfl: 2   fluconazole (DIFLUCAN) 150 MG tablet, One po qd prn yeast infection; may repeat in 3-4 days if needed (Patient taking differently: Take 150 mg by mouth See admin instructions. One po qd prn yeast infection; may repeat in 3-4 days if needed), Disp: 2 tablet, Rfl: 0   fluticasone (FLOVENT HFA) 220 MCG/ACT inhaler, Inhale 2 puffs into  the lungs 2 (two) times daily. Do not inhale, swallow powder and drink water , do not eat food for 20 minutes after puffs, Disp: 4 each, Rfl: 2   HYDROmorphone (DILAUDID) 4 MG tablet,  Take 4 mg by mouth every 4 (four) hours as needed for severe pain., Disp: , Rfl:    lansoprazole (PREVACID) 30 MG capsule, Take 1 capsule (30 mg total) by mouth daily., Disp: 180 capsule, Rfl: 1   levalbuterol (XOPENEX HFA) 45 MCG/ACT inhaler, INHALE (2) PUFFS INTO THE LUNGS EVERY SIX HOURS AS NEEDED FOR WHEEZING. (Patient taking differently: Inhale 2 puffs into the lungs every 6 (six) hours as needed for wheezing or shortness of breath. INHALE (2) PUFFS INTO THE LUNGS EVERY SIX HOURS AS NEEDED FOR WHEEZING.), Disp: 15 g, Rfl: 3   levalbuterol (XOPENEX) 1.25 MG/3ML nebulizer solution, Take 1.25 mg by nebulization every 4 (four) hours as needed for wheezing., Disp: 50 mL, Rfl: 3   linaclotide (LINZESS) 290 MCG CAPS capsule, Take 1 capsule (290 mcg total) by mouth daily before breakfast., Disp: 90 capsule, Rfl: 3   meclizine (ANTIVERT) 25 MG tablet, Take 1 tablet (25 mg total) by mouth 3 (three) times daily as needed for dizziness., Disp: 30 tablet, Rfl: 0   naloxegol oxalate (MOVANTIK) 12.5 MG TABS tablet, Take 2 tablets (25 mg total) by mouth daily., Disp: 90 tablet, Rfl: 3   naproxen (NAPROSYN) 500 MG tablet, Take 1 tablet (500 mg total) by mouth 2 (two) times daily with a meal., Disp: 60 tablet, Rfl: 1   ondansetron (ZOFRAN) 8 MG tablet, Take 1 tablet (8 mg total) by mouth every 8 (eight) hours as needed for nausea., Disp: 12 tablet, Rfl: 3   pantoprazole (PROTONIX) 40 MG tablet, TAKE 1 TABLET BY MOUTH TWICE DAILY BEFORE MEALS., Disp: 60 tablet, Rfl: 2   Pediatric Multivit-Minerals-C (FLINTSTONES GUMMIES) chewable tablet, Chew 3 tablets by mouth daily., Disp: , Rfl:    promethazine (PHENERGAN) 12.5 MG tablet, TAKE 1 TABLET BY MOUTH EVERY 8 HOURS AS NEEDED FOR NAUSEA OR VOMITING (Patient taking differently: Take 12.5 mg by  mouth every 8 (eight) hours as needed for nausea or vomiting.), Disp: 30 tablet, Rfl: 0   promethazine (PHENERGAN) 25 MG tablet, Take 25 mg by mouth 2 (two) times daily as needed for nausea or vomiting., Disp: , Rfl:    rOPINIRole (REQUIP) 1 MG tablet, Take one tablet po each evening. (Patient taking differently: Take 1 mg by mouth at bedtime.), Disp: 30 tablet, Rfl: 5   sucralfate (CARAFATE) 1 g tablet, Take 1 tablet (1 g total) by mouth 4 (four) times daily -  with meals and at bedtime., Disp: 180 tablet, Rfl: 3   valACYclovir (VALTREX) 1000 MG tablet, TAKE 1 TABLET BY MOUTH DAILY. (Patient taking differently: Take 1,000 mg by mouth daily as needed (fever blisters).), Disp: 90 tablet, Rfl: PRN   Vitamin D, Ergocalciferol, (DRISDOL) 1.25 MG (50000 UNIT) CAPS capsule, Take 50,000 Units by mouth every Wednesday., Disp: , Rfl:   Allergies  Allergen Reactions   Gabapentin Other (See Comments)    Pt states that she was unresponsive after taking this medication, but her vitals remained stable.     Metoclopramide Hcl Anxiety and Other (See Comments)    Pt states that she felt like she was trapped in a box, and could not get out.  Pt also states that she had temporary loss of movement, weakness, and tingling.     Tramadol Other (See Comments)    Seizures   Ativan [Lorazepam] Other (See Comments)    combative   Latex Itching, Rash and Other (See Comments)    Burns   Lyrica [Pregabalin] Other (See Comments)    suicidal  Iron Other (See Comments)    PATIENT IS INTOLERANT TO ORAL IRON , SHE DOES NOT ABSORB IT.     Sulfa Antibiotics Hives   Butrans [Buprenorphine] Rash    Patch caused rash, didn't help with pain   Nucynta [Tapentadol] Nausea And Vomiting   Propofol Nausea And Vomiting   Tape Hives, Itching and Rash    Please use paper tape   Toradol [Ketorolac Tromethamine] Anxiety     ROS  As noted in HPI.   Physical Exam  BP 91/61   Pulse 94   Temp 99.2 F (37.3 C)   Resp 16    SpO2 93%   Constitutional: Well developed, well nourished, no acute distress Eyes:  EOMI, conjunctiva normal bilaterally HENT: Normocephalic, atraumatic,mucus membranes moist Respiratory: Normal inspiratory effort Cardiovascular: Normal rate GI: nondistended skin: No rash, skin intact Musculoskeletal: no deformities.  Calves symmetric, nontender, no edema.  Decreased temperature right lower extremity, but skin pink.  PT 2+ and equal bilaterally. Neurologic: Alert & oriented x 3, cranial nerves III through XII intact.  Strength and sensation in upper extremities equal bilaterally.  Sensation to light touch and temperature intact and equal over bilateral proximal legs.  Absent sensation to temperature below the knee right lower extremity.  Sharp/soft intact right lower extremity.  Strength in her big toes, ankle, knee, hip intact and equal bilaterally.  Knee jerk 2+/2+ and equal bilaterally.  Patient unable to perform tandem gait.  Romberg positive.  Patient unable to walk unassisted.  She limps on the right side. Psychiatric: Speech and behavior appropriate   ED Course   Medications - No data to display  No orders of the defined types were placed in this encounter.   No results found. However, due to the size of the patient record, not all encounters were searched. Please check Results Review for a complete set of results. No results found.  ED Clinical Impression  1. Decreased sensation of lower extremity   2. Weakness of right lower extremity      ED Assessment/Plan  Previous records reviewed reviewed.  As noted in HPI.   Patient has decreased sensation to temperature in her right lower extremity, however sharp/soft is intact.  Her right lower extremity is cooler than her left, but it is pink.  Strength in her toes, heels, knees, hips are equal when isolated.  However she has an ataxic gait, is requiring a walker in order to ambulate, and is unable to bear weight on her leg  because she cannot feel where her foot is in space and because of pain with weightbearing.  There is no evidence of neurovascular compromise.  No evidence of DVT.  I am unsure as to the cause of her symptoms, but I wonder if she may have had a lacunar stroke.  Transferring to the emergency department for comprehensive work-up.  Since it has been a week, she is stable to go by private vehicle.  Discussed medical decision making, rationale for transfer to the emergency department with the patient and parent.  They agree to go  No orders of the defined types were placed in this encounter.     *This clinic note was created using Dragon dictation software. Therefore, there may be occasional mistakes despite careful proofreading.  ?    Melynda Ripple, MD 07/28/21 1742

## 2021-07-28 NOTE — ED Notes (Signed)
Patient is being discharged from the Urgent Care and sent to the Emergency Department via pov . Per Dr. Alphonzo Cruise, patient is in need of higher level of care due to RT calf numbness. Patient is aware and verbalizes understanding of plan of care.  Vitals:   07/28/21 1603  BP: 91/61  Pulse: 94  Resp: 16  Temp: 99.2 F (37.3 C)  SpO2: 93%

## 2021-07-28 NOTE — ED Provider Notes (Signed)
[facility] EMERGENCY DEPARTMENT Provider Note   CSN: [id number] Arrival date & time: 07/28/21  1758     History No chief complaint on file.   Penny [name] is a 35 y.o. female.  Patient sent from urgent care with "rule out stroke".  States 1 week ago she woke up with numbness and weakness to her right lower extremity from the knee down.  States the pain between her knee and mid calf is improved though she still has numbness to her right foot and right medial leg and ankle.  She is also having some weakness making it difficult to walk.  She was sent from urgent care with concern for stroke.  States she has a history of compression fractures in her back with kyphoplasty but no recent trauma or fall.  Denies any bowel or bladder incontinence.  Denies any fever or vomiting.  Denies any chest pain or shortness of breath.  Denies any pain similar to this in the past.  Has had some minimal right-sided back pain that radiates down the right leg.  No weakness in her arms.  No difficulty speaking or difficulty swallowing.  She was told by her family that she has had some memory issues for the past week as well. No fever or cough. She has not been able to ambulate has been using a walker and a wheelchair.  The history is provided by the patient.      Past Medical History:  Diagnosis Date   ADD (attention deficit disorder)    Anemia 2011   2o to GASTRIC BYPASS   Anginal pain (HCC)    Anxiety    Asthma    Blood transfusion without reported diagnosis    Chronic daily headache    Depression    Depression    Dysmenorrhea 09/18/2013   Dysrhythmia    Elevated liver enzymes JUL 2011ALK PHOS 111-127 AST  143-267 ALT  213-321T BILI 0.6  ALB  3.7-4.06 Jun 2011 ALK PHOS 118 AST 24 ALT 42 T BILI 0.4 ALB 3.9   Encounter for drug rehabilitation    at behavioral health for opioid addiction about 4 years ago   Family history of adverse reaction to anesthesia    'dad had to be kept on pump for  breathing for morphine'   Fatty liver    Fibroid 01/18/2017   Fibromyalgia    Gastric bypass status for obesity    Gastritis JULY 2011   Heart murmur    Hereditary and idiopathic peripheral neuropathy 01/15/2015   History of Holter monitoring    Hx of opioid abuse (HCC)    for about 4 years, about 4 years ago   Interstitial cystitis    Iron deficiency anemia 07/23/2010   Irritable bowel syndrome 2012 DIARRHEA   JUN 2012 TTG IgA 14.9   IUD FEB 2010   Lupus (HCC)    Menorrhagia 07/18/2013   Migraines    Obesity (BMI 30-39.9) 2011 228 LBS BMI 36.8   Osteoporosis    Ovarian cyst    Patient desires pregnancy 09/18/2013   Polyneuropathy    PONV (postoperative nausea and vomiting) seizure post-operatively   seizure following ankle surgery   Potassium (K) deficiency    Pregnant 12/25/2013   Psychiatric pseudoseizure    RLQ abdominal pain 07/18/2013   Sciatica of left side 11/14/2014   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   Stress 09/18/2013    Patient Active Problem List   Diagnosis Date Noted  Chronic prescription opiate use 12/25/2020   Sphincter of Oddi dysfunction 12/25/2020   Constipation 12/25/2020   Major depressive disorder, recurrent episode ([place]) 06/22/2020   Major depression 06/19/2020   Dehydration    Chronic abdominal pain 09/19/2019   Gastroesophageal reflux disease without esophagitis    Cannabis abuse 08/30/2019   Dyspnea    Severe anxiety 08/21/2019   Absolute anemia 04/23/2019   Urge incontinence 10/26/2018   S/P kyphoplasty 08/02/2018   Herpes genitalis in women 12/19/2017   Cannabis use disorder, moderate, dependence ([place]) 07/07/2017   Abnormal urine odor 03/30/2017   Urinary tract infection with hematuria 03/30/2017   Screening for genitourinary condition 03/30/2017   Pain 03/30/2017   Burning with urination 03/30/2017   Family history of breast cancer in mother,uncertain BR CA status 02/28/2017   Fibroid 01/18/2017   Chronic pain syndrome  12/05/2016   Folate deficiency 12/04/2016   Anastomotic ulcer 04/22/2016   Pain medication agreement signed 04/22/2016   Complaints of leg weakness    Paresthesia    Left-sided weakness    Uncontrolled pain 01/24/2016   Malnutrition of moderate degree 01/24/2016   Neuropathy 01/23/2016   Inability to walk 01/23/2016   Paresthesias    Bilateral leg numbness 11/26/2015   Major depressive disorder, recurrent episode, 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[place]) 05/10/2015   Post partum depression 05/09/2015   Hematemesis 04/07/2015   Intractable vomiting with nausea 04/07/2015   Elevated liver enzymes    Epigastric pain    Pseudoseizure ([place]) 02/22/2015   Seizure-like activity ([place]) 02/22/2015   Fibromyalgia 02/18/2015   Vulvar fissure 02/18/2015   Clinical depression 02/01/2015   Current smoker 01/29/2015   Current tobacco use 01/29/2015   Hereditary and idiopathic peripheral neuropathy 01/15/2015   Leg weakness, bilateral 01/02/2015   Gait disorder 01/02/2015   Other symptoms and signs involving the musculoskeletal system 01/02/2015   Abnormal gait 01/02/2015   Cervicalgia 12/18/2014   Sciatica of left side 11/14/2014   Neuralgia neuritis, sciatic nerve 11/14/2014   Pseudoseizures ([place]) 09/12/2014   Encounter for sterilization 09/09/2014   Abdominal pain, acute, right lower quadrant 08/22/2014   Headache, migraine 08/19/2014   Seizure ([place]) 08/18/2014   Encephalopathy 08/13/2014   Headache 08/13/2014   Altered mental status 08/13/2014   Right leg numbness 07/31/2014   Disturbance of skin sensation 07/31/2014   Hypertension in pregnancy, transient 07/08/2014   Previous gastric bypass affecting pregnancy, antepartum 05/22/2014   History of bariatric surgery 05/22/2014   Patent foramen ovale with right to left shunt 05/17/2014   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   Antepartum mental disorder in pregnancy 05/09/2014   Rapid palpitations 02/18/2014   H/O maternal third degree  perineal laceration, currently pregnant 02/18/2014   High-risk pregnancy 02/18/2014   Supervision of pregnancy with other poor reproductive or obstetric history, unspecified trimester 02/18/2014   Restless legs 01/16/2014   Restless leg 01/16/2014   Chronic interstitial cystitis 10/23/2013   Menorrhagia 07/18/2013   Excessive and frequent menstruation 07/18/2013   h/o Opiate addiction 03/11/2012    Class: Acute   History of migraine headaches 03/10/2012    Class: Acute   Depression with anxiety 03/10/2012    Class: Chronic   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 03/10/2012    Class: Chronic   Attention deficit hyperactivity disorder, 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    Class: Chronic   Panic disorder without agoraphobia 03/10/2012   H/O disease 03/10/2012   Dysthymia 03/10/2012   Conversion disorder with seizures or  convulsions 03/10/2012   Other specified behavioral and emotional disorders with onset usually occurring in childhood and adolescence 03/10/2012   Pelvic congestion syndrome 10/13/2011   Coitalgia 10/13/2011   Chronic migraine without aura 10/13/2011   Unspecified dyspareunia 10/13/2011   Other specified conditions associated with female genital organs and menstrual cycle 10/13/2011   IBS (irritable bowel syndrome) 08/25/2011   OBESITY, UNSPECIFIED 09/17/2010   Iron deficiency anemia 07/23/2010    Past Surgical History:  Procedure Laterality Date   BIOPSY  04/10/2015   Procedure: BIOPSY;  Surgeon: Daneil Dolin, MD;  Location: [facility];  Service: Endoscopy;;   BIOPSY  04/15/2021   Procedure: BIOPSY;  Surgeon: Montez Morita, Quillian Quince, MD;  Location: [facility];  Service: Gastroenterology;;   cath self every nite     for sodium bicarb injection (discontinued 2013)   CHOLECYSTECTOMY  2005   biliary dyskinesia   COLONOSCOPY  JUN 2012 ABD PN/DIARRHEA WITH PROPOFOL   NL COLON   COLONOSCOPY WITH PROPOFOL N/A 01/16/2021   Procedure:  COLONOSCOPY WITH PROPOFOL;  Surgeon: Harvel Quale, MD;  Location: [facility];  Service: Gastroenterology;  Laterality: N/A;  1:45   DILATION AND CURETTAGE OF UTERUS     DILITATION & CURRETTAGE/HYSTROSCOPY WITH NOVASURE ABLATION N/A 03/24/2017   Procedure: DILATATION & CURETTAGE/HYSTEROSCOPY WITH NOVASURE ENDOMETRIAL ABLATION;  Surgeon: Jonnie Kind, MD;  Location: [facility];  Service: Gynecology;  Laterality: N/A;   ESOPHAGEAL DILATION N/A 04/10/2015   Procedure: ESOPHAGEAL DILATION WITH 54FR MALONEY DILATOR;  Surgeon: Daneil Dolin, MD;  Location: [facility];  Service: Endoscopy;  Laterality: N/A;   ESOPHAGOGASTRODUODENOSCOPY     ESOPHAGOGASTRODUODENOSCOPY (EGD) WITH PROPOFOL N/A 04/10/2015   Procedure: ESOPHAGOGASTRODUODENOSCOPY (EGD) WITH PROPOFOL;  Surgeon: Daneil Dolin, MD;  Location: [facility];  Service: Endoscopy;  Laterality: N/A;   ESOPHAGOGASTRODUODENOSCOPY (EGD) WITH PROPOFOL N/A 12/06/2016   Procedure: ESOPHAGOGASTRODUODENOSCOPY (EGD) WITH PROPOFOL;  Surgeon: Danie Binder, MD;  [facility];  Service: Endoscopy;  Laterality: N/A;   ESOPHAGOGASTRODUODENOSCOPY (EGD) WITH PROPOFOL N/A 04/27/2019   Procedure: ESOPHAGOGASTRODUODENOSCOPY (EGD) WITH PROPOFOL;  Surgeon: Rogene Houston, MD;  [facility];  Service: Endoscopy;  Laterality: N/A;  730   ESOPHAGOGASTRODUODENOSCOPY (EGD) WITH PROPOFOL N/A 08/31/2019   Procedure: ESOPHAGOGASTRODUODENOSCOPY (EGD) WITH PROPOFOL;  Surgeon: Rogene Houston, MD;  [facility];  Service: Endoscopy;  Laterality: N/A;   ESOPHAGOGASTRODUODENOSCOPY (EGD) WITH PROPOFOL N/A 01/16/2021   Procedure: ESOPHAGOGASTRODUODENOSCOPY (EGD) WITH PROPOFOL;  Surgeon: Harvel Quale, MD;  [facility];  Service: Gastroenterology;  Laterality: N/A;   ESOPHAGOGASTRODUODENOSCOPY (EGD) WITH PROPOFOL N/A 04/15/2021   Procedure: ESOPHAGOGASTRODUODENOSCOPY (EGD) WITH PROPOFOL;  Surgeon: Harvel Quale, MD;   [facility];  Service: Gastroenterology;  Laterality: N/A;  9:00 AM   GAB  2007   in [place]-POUCH 5 CM   GASTRIC BYPASS  06/2006   HYSTEROSCOPY WITH D & C N/A 09/12/2014   Procedure: DILATATION AND CURETTAGE /HYSTEROSCOPY;  Surgeon: Jonnie Kind, MD;  Location: [facility];  Service: Gynecology;  Laterality: N/A;   KYPHOPLASTY N/A 08/02/2018   Procedure: KYPHOPLASTY T12;  Surgeon: Melina Schools, MD;  Location: [facility];  Service: Orthopedics;  Laterality: N/A;  60 mins   LAPAROSCOPIC TUBAL LIGATION Bilateral 03/24/2017   Procedure: LAPAROSCOPIC TUBAL LIGATION ([place] Rings);  Surgeon: Jonnie Kind, MD;  Location: [facility];  Service: Gynecology;  Laterality: Bilateral;   REPAIR VAGINAL CUFF N/A 07/30/2014   Procedure: REPAIR VAGINAL CUFF;  Surgeon: Catalina Antigua, MD;  Location: [facility];  Service: Gynecology;  Laterality: N/A;   SAVORY DILATION  06/20/2012   Dr. Cyndi Bender gastritis/Ulcer in the mid jejunum. Empiric dilation.    SURAL NERVE BX Left 02/25/2016   Procedure: LEFT SURAL NERVE BIOPSY;  Surgeon: Shirlean Kelly, MD;  Location: [facility];  Service: Neurosurgery;  Laterality: Left;  Left sural nerve biopsy   TONSILLECTOMY     TONSILLECTOMY AND ADENOIDECTOMY     UPPER GASTROINTESTINAL ENDOSCOPY  JULY 2011 NAUSEA-D125,V6, PH 25   Bx; GASTRITIS, POUCH-5 CM LONG   WISDOM TOOTH EXTRACTION       OB History     Gravida  3   Para  2   Term  1   Preterm  1   AB  1   Living  2      SAB  1   IAB      Ectopic      Multiple      Live Births  2           Family History  Problem Relation Age of Onset   Hemochromatosis Maternal Grandmother    Migraines Maternal Grandmother    Cancer Maternal Grandmother    Breast cancer Maternal Grandmother    Hypertension Father    Diabetes Father    Coronary artery disease Father    Migraines Paternal Grandmother    Breast cancer Paternal Grandmother    Cancer Mother        breast   Hemochromatosis Mother     Breast cancer Mother    Depression Mother    Anxiety disorder Mother    Coronary artery disease Paternal Grandfather    Anxiety disorder Brother    Bipolar disorder Brother    Healthy Daughter    Healthy Son     Social History   Tobacco Use   Smoking status: Former    Packs/day: 0.25    Years: 6.00    Pack years: 1.50    Types: Cigarettes    Start date: 05/2018   Smokeless tobacco: Never  Vaping Use   Vaping Use: Some days  Substance Use Topics   Alcohol use: Not Currently    Alcohol/week: 0.0 standard drinks   Drug use: Not Currently    Home Medications Prior to Admission medications   Medication Sig Start Date End Date Taking? Authorizing Provider  amitriptyline (ELAVIL) 50 MG tablet Take 1 tablet (50 mg total) by mouth at bedtime. 07/13/21   Myrlene Broker, MD  amphetamine-dextroamphetamine (ADDERALL XR) 30 MG 24 hr capsule Take 1 capsule (30 mg total) by mouth every morning. 07/13/21   Myrlene Broker, MD  amphetamine-dextroamphetamine (ADDERALL XR) 30 MG 24 hr capsule Take 1 capsule (30 mg total) by mouth every morning. 07/13/21   Myrlene Broker, MD  busPIRone (BUSPAR) 10 MG tablet Take 1 tablet (10 mg total) by mouth 3 (three) times daily. 07/13/21   Myrlene Broker, MD  Calcium Carb-Cholecalciferol (CALCIUM-VITAMIN D3) 600-500 MG-UNIT CAPS Take 1 tablet by mouth daily.    [provider]  Carboxymethylcellul-Glycerin (LUBRICATING EYE DROPS OP) Place 1 drop into both eyes daily as needed (dry eyes).    [provider]  cetirizine (ZYRTEC) 10 MG tablet TAKE ONE TABLET BY MOUTH DAILY Patient taking differently: Take 10 mg by mouth daily. 03/17/21   Alfonse Spruce, MD  clidinium-chlordiazePOXIDE (LIBRAX) 5-2.5 MG capsule Take 1 capsule by mouth 3 (three) times daily before meals. 03/17/21  Montez Morita, Quillian Quince, MD  clonazePAM (KLONOPIN) 0.5 MG tablet Take 1 tablet (0.5 mg total) by mouth 2 (two) times daily as needed. 07/13/21   Cloria Spring, MD   cyclobenzaprine (FLEXERIL) 10 MG tablet Take 1 tablet (10 mg total) by mouth 3 (three) times daily as needed for muscle spasms. 07/24/21   Hassell Done Mary-Margaret, FNP  DULoxetine (CYMBALTA) 60 MG capsule Take 1 capsule (60 mg total) by mouth 2 (two) times daily. 07/13/21   Cloria Spring, MD  fluconazole (DIFLUCAN) 150 MG tablet One po qd prn yeast infection; may repeat in 3-4 days if needed Patient taking differently: Take 150 mg by mouth See admin instructions. One po qd prn yeast infection; may repeat in 3-4 days if needed 01/29/21   Kathyrn Drown, MD  fluticasone (FLOVENT HFA) 220 MCG/ACT inhaler Inhale 2 puffs into the lungs 2 (two) times daily. Do not inhale, swallow powder and drink water , do not eat food for 20 minutes after puffs 04/23/21   Montez Morita, Quillian Quince, MD  HYDROmorphone (DILAUDID) 4 MG tablet Take 4 mg by mouth every 4 (four) hours as needed for severe pain.    [provider]  lansoprazole (PREVACID) 30 MG capsule Take 1 capsule (30 mg total) by mouth daily. 04/15/21   Harvel Quale, MD  levalbuterol [facility] HFA) 45 MCG/ACT inhaler INHALE (2) PUFFS INTO THE LUNGS EVERY SIX HOURS AS NEEDED FOR WHEEZING. Patient taking differently: Inhale 2 puffs into the lungs every 6 (six) hours as needed for wheezing or shortness of breath. INHALE (2) PUFFS INTO THE LUNGS EVERY SIX HOURS AS NEEDED FOR WHEEZING. 02/24/21   Valentina Shaggy, MD  levalbuterol [facility]) 1.25 MG/3ML nebulizer solution Take 1.25 mg by nebulization every 4 (four) hours as needed for wheezing. 02/24/21   Valentina Shaggy, MD  linaclotide [facility]) 290 MCG CAPS capsule Take 1 capsule (290 mcg total) by mouth daily before breakfast. 03/26/21   Montez Morita, Quillian Quince, MD  meclizine (ANTIVERT) 25 MG tablet Take 1 tablet (25 mg total) by mouth 3 (three) times daily as needed for dizziness. 05/07/21   Mar Daring, PA-C  naloxegol oxalate (MOVANTIK) 12.5 MG TABS tablet Take 2 tablets (25  mg total) by mouth daily. 03/26/21   Harvel Quale, MD  naproxen (NAPROSYN) 500 MG tablet Take 1 tablet (500 mg total) by mouth 2 (two) times daily with a meal. 07/24/21   Hassell Done, Mary-Margaret, FNP  ondansetron (ZOFRAN) 8 MG tablet Take 1 tablet (8 mg total) by mouth every 8 (eight) hours as needed for nausea. 01/07/21   Kathyrn Drown, MD  pantoprazole (PROTONIX) 40 MG tablet TAKE 1 TABLET BY MOUTH TWICE DAILY BEFORE MEALS. 07/09/21   Kathyrn Drown, MD  Pediatric Multivit-Minerals-C (FLINTSTONES GUMMIES) chewable tablet Chew 3 tablets by mouth daily.    [provider]  promethazine (PHENERGAN) 12.5 MG tablet TAKE 1 TABLET BY MOUTH EVERY 8 HOURS AS NEEDED FOR NAUSEA OR VOMITING Patient taking differently: Take 12.5 mg by mouth every 8 (eight) hours as needed for nausea or vomiting. 03/17/21   Montez Morita, Quillian Quince, MD  promethazine (PHENERGAN) 25 MG tablet Take 25 mg by mouth 2 (two) times daily as needed for nausea or vomiting.    [provider]  rOPINIRole (REQUIP) 1 MG tablet Take one tablet po each evening. Patient taking differently: Take 1 mg by mouth at bedtime. 10/27/20   Kathyrn Drown, MD  sucralfate (CARAFATE) 1 g tablet Take 1 tablet (  1 g total) by mouth 4 (four) times daily -  with meals and at bedtime. 03/26/21 04/25/21  Harvel Quale, MD  valACYclovir (VALTREX) 1000 MG tablet TAKE 1 TABLET BY MOUTH DAILY. Patient taking differently: Take 1,000 mg by mouth daily as needed (fever blisters). 08/08/20   Estill Dooms, NP  Vitamin D, Ergocalciferol, (DRISDOL) 1.25 MG (50000 UNIT) CAPS capsule Take 50,000 Units by mouth every Wednesday.    [provider]    Allergies    Gabapentin, Metoclopramide hcl, Tramadol, Ativan [lorazepam], Latex, Lyrica [pregabalin], Iron, Sulfa antibiotics, Butrans [buprenorphine], Nucynta [tapentadol], Propofol, Tape, and Toradol [ketorolac tromethamine]  Review of Systems   Review of Systems   Constitutional:  Negative for activity change, appetite change and fever.  HENT:  Negative for congestion and rhinorrhea.   Respiratory:  Negative for cough, chest tightness and shortness of breath.   Gastrointestinal:  Negative for abdominal pain, nausea and vomiting.  Genitourinary:  Negative for dysuria and hematuria.  Musculoskeletal:  Positive for arthralgias, back pain and myalgias.  Neurological:  Positive for weakness and numbness. Negative for dizziness, facial asymmetry and light-headedness.   all other systems are negative except as noted in the HPI and PMH.   Physical Exam Updated Vital Signs BP 100/70   Pulse 68   Temp 98 F (36.7 C) (Oral)   Resp 17   SpO2 96%   Physical Exam Vitals and nursing note reviewed.  Constitutional:      General: She is not in acute distress.    Appearance: She is well-developed.  HENT:     Head: Normocephalic and atraumatic.     Mouth/Throat:     Pharynx: No oropharyngeal exudate.  Eyes:     Conjunctiva/sclera: Conjunctivae normal.     Pupils: Pupils are equal, round, and reactive to light.  Neck:     Comments: No meningismus. Cardiovascular:     Rate and Rhythm: Normal rate and regular rhythm.     Heart sounds: Normal heart sounds. No murmur heard. Pulmonary:     Effort: Pulmonary effort is normal. No respiratory distress.     Breath sounds: Normal breath sounds.  Chest:     Chest wall: No tenderness.  Abdominal:     Palpations: Abdomen is soft.     Tenderness: There is no abdominal tenderness. There is no guarding or rebound.  Musculoskeletal:        General: Tenderness present. Normal range of motion.     Cervical back: Normal range of motion and neck supple.     Comments: R paraspinal lumbar tenderness  5/5 bilateral flexion and extension of knees.  Reduced plantar flexion on the R, weakness with great toe extension  Intact DP and PT pulses bilaterally.  No calf asymmetry.  Decreased sensation to right medial lower  leg. Some weakness with plantar flexion as well as ankle extension and great toe extension on the right. Equal strength in bilateral hips and knees.  +1 patellar reflexes bilaterally.    Skin:    General: Skin is warm.  Neurological:     Mental Status: She is alert and oriented to person, place, and time.     Cranial Nerves: No cranial nerve deficit.     Motor: No abnormal muscle tone.     Coordination: Coordination normal.     Comments:  5/5 strength throughout. CN 2-12 intact.Equal grip strength.   Psychiatric:        Behavior: Behavior normal.    ED Results /  Procedures / Treatments   Labs (all labs ordered are listed, but only abnormal results are displayed) Labs Reviewed  COMPREHENSIVE METABOLIC PANEL - Abnormal; Notable for the following components:      Result Value   Sodium 134 (*)    Glucose, Bld 129 (*)    Creatinine, Ser 0.41 (*)    Calcium 8.4 (*)    Total Protein 5.9 (*)    Albumin 3.1 (*)    Anion gap <3 (*)    All other components within normal limits  RESP PANEL BY RT-PCR (FLU A&B, COVID) ARPGX2  CBC WITH DIFFERENTIAL/PLATELET  MAGNESIUM  I-STAT BETA HCG BLOOD, ED ([facility], [facility], [facility] ONLY)    EKG None  Radiology CT HEAD WO CONTRAST (5MM)  Result Date: 07/29/2021 CLINICAL DATA:  Neuro deficit, acute, stroke suspected.  Memory loss EXAM: CT HEAD WITHOUT CONTRAST TECHNIQUE: Contiguous axial images were obtained from the base of the skull through the vertex without intravenous contrast. COMPARISON:  None. FINDINGS: Brain: Normal anatomic configuration. No abnormal intra or extra-axial mass lesion or fluid collection. No abnormal mass effect or midline shift. No evidence of acute intracranial hemorrhage or infarct. Ventricular size is normal. Cerebellum unremarkable. Vascular: Unremarkable Skull: Intact Sinuses/Orbits: Mucous retention cysts are seen within the paranasal sinuses bilaterally. Remaining paranasal sinuses are clear. Orbits are unremarkable. Other: Mastoid  air cells and middle ear cavities are clear. IMPRESSION: No acute intracranial abnormality. Mild sphenoid sinus disease. Electronically Signed   By: Fidela Salisbury M.D.   On: 07/29/2021 00:46   CT Lumbar Spine Wo Contrast  Result Date: 07/29/2021 CLINICAL DATA:  Low back pain, progressive neurologic deficit. Remote history of thoracolumbar spine fracture. EXAM: CT LUMBAR SPINE WITHOUT CONTRAST TECHNIQUE: Multidetector CT imaging of the lumbar spine was performed without intravenous contrast administration. Multiplanar CT image reconstructions were also generated. COMPARISON:  Lumbar radiographs 03/15/2021 FINDINGS: Segmentation: 5 lumbar type vertebrae. Alignment: Normal lumbar lordosis. Mild focal kyphosis at T12-L1 secondary to L1 compression deformity, stable since prior examination Vertebrae: Mild compression deformity of T12 with changes of vertebral augmentation are again identified. Chronic compression deformity of L1 is again identified with approximately 50% loss of height, unchanged. No retropulsion. Remaining vertebral body height is preserved. No acute fracture or listhesis. No lytic or blastic bone lesion identified. Paraspinal and other soft tissues: Negative. Disc levels: Review of the axial images demonstrates: T12-L1: Mild focal kyphosis. Mild degenerative disc disease with intervertebral disc space narrowing. No significant neuroforaminal narrowing. No significant canal stenosis. L1-2: Disc space preserved. No significant neuroforaminal narrowing. No canal stenosis. L2-3: Disc space preserved. No significant neuroforaminal narrowing. No canal stenosis. L3-4: Mild broad-based disc bulge. No significant neuroforaminal narrowing. No significant canal stenosis. No significant facet arthrosis. L4-5: Moderate posterior broad-based disc osteophyte complex abuts and mildly remodels the thecal sac. Mild hypertrophy of the lamina propria. Minimal central canal stenosis. No significant neuroforaminal  narrowing. L5-S1: Disc space preserved. No significant canal stenosis. No neuroforaminal narrowing. IMPRESSION: No acute fracture or listhesis of the lumbar spine. Stable changes of T12 vertebroplasty and L1 compression fracture with approximately 50% loss of height and mild resultant focal kyphosis at T12-L1. Electronically Signed   By: Fidela Salisbury M.D.   On: 07/29/2021 01:00    Procedures Procedures   Medications Ordered in ED Medications - No data to display  ED Course  I have reviewed the triage vital signs and the nursing notes.  Pertinent labs & imaging results that were available during my care of the patient  were reviewed by me and considered in my medical decision making (see chart for details).    MDM Rules/Calculators/A&P                          1 week of right lower leg weakness and numbness.  Intact distal pulses.  No asymmetry to suggest DVT.  Sent from urgent care with concern for stroke.  Electrolytes reassuring.  Normal potassium and magnesium.  Weakness to right foot and ankle on exam.  Intact reflexes +1 bilaterally.  Previous compression fractures.  No new trauma.  CT Head is negative.  CT lumbar spine shows stable compression fractures without acute abnormality.  MRI not available.  Transfer for MRI to rule out stroke as well as rule out lumbar spinal cord compression discussed with Dr. Dina Rich at [facility].  Discussed with Dr. Dina Rich.  Patient and mother in agreement.  Chronic pain medications given Final Clinical Impression(s) / ED Diagnoses Final diagnoses:  Right leg weakness    Rx / DC Orders ED Discharge Orders     None        Anaijah Augsburger, Annie Main, MD 07/29/21 [phone]

## 2021-07-28 NOTE — ED Triage Notes (Signed)
Pt reports Rt calf numbness since Last Tuesday.

## 2021-07-29 ENCOUNTER — Emergency Department (HOSPITAL_COMMUNITY): Payer: Medicaid Other

## 2021-07-29 DIAGNOSIS — R29898 Other symptoms and signs involving the musculoskeletal system: Secondary | ICD-10-CM | POA: Diagnosis not present

## 2021-07-29 DIAGNOSIS — R29818 Other symptoms and signs involving the nervous system: Secondary | ICD-10-CM | POA: Diagnosis not present

## 2021-07-29 DIAGNOSIS — Z20822 Contact with and (suspected) exposure to covid-19: Secondary | ICD-10-CM | POA: Diagnosis not present

## 2021-07-29 DIAGNOSIS — N9489 Other specified conditions associated with female genital organs and menstrual cycle: Secondary | ICD-10-CM | POA: Diagnosis not present

## 2021-07-29 DIAGNOSIS — J323 Chronic sphenoidal sinusitis: Secondary | ICD-10-CM | POA: Diagnosis not present

## 2021-07-29 DIAGNOSIS — R202 Paresthesia of skin: Secondary | ICD-10-CM | POA: Diagnosis not present

## 2021-07-29 DIAGNOSIS — J341 Cyst and mucocele of nose and nasal sinus: Secondary | ICD-10-CM | POA: Diagnosis not present

## 2021-07-29 DIAGNOSIS — M545 Low back pain, unspecified: Secondary | ICD-10-CM | POA: Diagnosis not present

## 2021-07-29 DIAGNOSIS — R531 Weakness: Secondary | ICD-10-CM | POA: Diagnosis not present

## 2021-07-29 DIAGNOSIS — Z7951 Long term (current) use of inhaled steroids: Secondary | ICD-10-CM | POA: Diagnosis not present

## 2021-07-29 DIAGNOSIS — J45909 Unspecified asthma, uncomplicated: Secondary | ICD-10-CM | POA: Diagnosis not present

## 2021-07-29 DIAGNOSIS — R200 Anesthesia of skin: Secondary | ICD-10-CM | POA: Diagnosis not present

## 2021-07-29 DIAGNOSIS — Z91040 Latex allergy status: Secondary | ICD-10-CM | POA: Diagnosis not present

## 2021-07-29 DIAGNOSIS — R413 Other amnesia: Secondary | ICD-10-CM | POA: Diagnosis not present

## 2021-07-29 DIAGNOSIS — Z87891 Personal history of nicotine dependence: Secondary | ICD-10-CM | POA: Diagnosis not present

## 2021-07-29 LAB — RESP PANEL BY RT-PCR (FLU A&B, COVID) ARPGX2
Influenza A by PCR: NEGATIVE
Influenza B by PCR: NEGATIVE
SARS Coronavirus 2 by RT PCR: NEGATIVE

## 2021-07-29 MED ORDER — MORPHINE SULFATE (PF) 4 MG/ML IV SOLN
4.0000 mg | Freq: Once | INTRAVENOUS | Status: AC
  Administered 2021-07-29: 4 mg via INTRAVENOUS
  Filled 2021-07-29: qty 1

## 2021-07-29 MED ORDER — HYDROMORPHONE HCL 2 MG PO TABS
4.0000 mg | ORAL_TABLET | Freq: Once | ORAL | Status: AC
  Administered 2021-07-29: 4 mg via ORAL
  Filled 2021-07-29: qty 2

## 2021-07-29 NOTE — ED Notes (Signed)
Patient transported to MRI 

## 2021-07-29 NOTE — ED Notes (Signed)
Patient returned from MRI.

## 2021-07-29 NOTE — ED Notes (Signed)
Patient with right leg weakness, numbness on the right leg, r/o cord compression and possible stroke.

## 2021-07-29 NOTE — Discharge Instructions (Addendum)
1. Medications: home pain control for back pain, usual home medications 2. Treatment: rest, drink plenty of fluids, gentle stretching as discussed, alternate ice and heat 3. Follow Up: Please followup with your primary doctor and neurosurgery in 3 days for discussion of your diagnoses and further evaluation after today's visit; if you do not have a primary care doctor use the resource guide provided to find one;  Return to the ER for worsening back pain, difficulty walking, loss of bowel or bladder control or other concerning symptoms

## 2021-07-29 NOTE — ED Provider Notes (Addendum)
Care assumed from Dr. Wyvonnia Dusky at [facility].  Please see his full H&P.  In short,  Penny [name] is a 35 y.o. female presents for ongoing weakness in the right foot and lower leg.  Work-up at [facility] initially unremarkable.  Patient transferred to [facility] for MRI for further evaluation.  Patient has not been seen by orthopedics or neurosurgery since her discharge in December 2021.   Physical Exam  BP 104/76 (BP Location: Right Arm)   Pulse 79   Temp 97.9 F (36.6 C) (Oral)   Resp 18   SpO2 100%   Physical Exam Vitals and nursing note reviewed.  Constitutional:      General: She is not in acute distress.    Appearance: She is well-developed. She is not ill-appearing.  HENT:     Head: Normocephalic.  Eyes:     General: No scleral icterus.    Conjunctiva/sclera: Conjunctivae normal.  Cardiovascular:     Rate and Rhythm: Normal rate.  Pulmonary:     Effort: Pulmonary effort is normal.  Abdominal:     General: There is no distension.  Musculoskeletal:        General: Normal range of motion.     Cervical back: Normal range of motion.  Skin:    General: Skin is warm and dry.  Neurological:     Mental Status: She is alert.  Psychiatric:        Mood and Affect: Mood normal.    ED Course/Procedures     Procedures  Results for orders placed or performed during the hospital encounter of 07/28/21  Resp Panel by RT-PCR (Flu A&B, Covid) Nasopharyngeal Swab   Specimen: Nasopharyngeal Swab; Nasopharyngeal(NP) swabs in vial transport medium  Result Value Ref Range   SARS Coronavirus 2 by RT PCR NEGATIVE NEGATIVE   Influenza A by PCR NEGATIVE NEGATIVE   Influenza B by PCR NEGATIVE NEGATIVE  CBC with Differential/Platelet  Result Value Ref Range   WBC 6.6 4.0 - 10.5 K/uL   RBC 4.07 3.87 - 5.11 MIL/uL   Hemoglobin 12.9 12.0 - 15.0 g/dL   HCT 39.2 36.0 - 46.0 %   MCV 96.3 80.0 - 100.0 fL   MCH 31.7 26.0 - 34.0 pg   MCHC 32.9 30.0 - 36.0 g/dL   RDW 13.0 11.5 - 15.5 %    Platelets 278 150 - 400 K/uL   nRBC 0.0 0.0 - 0.2 %   Neutrophils Relative % 58 %   Neutro Abs 3.8 1.7 - 7.7 K/uL   Lymphocytes Relative 31 %   Lymphs Abs 2.1 0.7 - 4.0 K/uL   Monocytes Relative 8 %   Monocytes Absolute 0.6 0.1 - 1.0 K/uL   Eosinophils Relative 2 %   Eosinophils Absolute 0.1 0.0 - 0.5 K/uL   Basophils Relative 1 %   Basophils Absolute 0.0 0.0 - 0.1 K/uL   Immature Granulocytes 0 %   Abs Immature Granulocytes 0.01 0.00 - 0.07 K/uL  Comprehensive metabolic panel  Result Value Ref Range   Sodium 134 (L) 135 - 145 mmol/L   Potassium 4.0 3.5 - 5.1 mmol/L   Chloride 103 98 - 111 mmol/L   CO2 29 22 - 32 mmol/L   Glucose, Bld 129 (H) 70 - 99 mg/dL   BUN 16 6 - 20 mg/dL   Creatinine, Ser 0.41 (L) 0.44 - 1.00 mg/dL   Calcium 8.4 (L) 8.9 - 10.3 mg/dL   Total Protein 5.9 (L) 6.5 - 8.1 g/dL  Albumin 3.1 (L) 3.5 - 5.0 g/dL   AST 25 15 - 41 U/L   ALT 28 0 - 44 U/L   Alkaline Phosphatase 72 38 - 126 U/L   Total Bilirubin 0.4 0.3 - 1.2 mg/dL   GFR, Estimated >60 >60 mL/min   Anion gap <3 (L) 5 - 15  Magnesium  Result Value Ref Range   Magnesium 2.2 1.7 - 2.4 mg/dL  I-Stat Beta hCG blood, ED ([facility], [facility], [facility] only)  Result Value Ref Range   I-stat hCG, quantitative <5.0 <5 mIU/mL   Comment 3           *Note: Due to a large number of results and/or encounters for the requested time period, some results have not been displayed. A complete set of results can be found in Results Review.   CT HEAD WO CONTRAST (5MM)  Result Date: 07/29/2021 CLINICAL DATA:  Neuro deficit, acute, stroke suspected.  Memory loss EXAM: CT HEAD WITHOUT CONTRAST TECHNIQUE: Contiguous axial images were obtained from the base of the skull through the vertex without intravenous contrast. COMPARISON:  None. FINDINGS: Brain: Normal anatomic configuration. No abnormal intra or extra-axial mass lesion or fluid collection. No abnormal mass effect or midline shift. No evidence of acute intracranial hemorrhage or  infarct. Ventricular size is normal. Cerebellum unremarkable. Vascular: Unremarkable Skull: Intact Sinuses/Orbits: Mucous retention cysts are seen within the paranasal sinuses bilaterally. Remaining paranasal sinuses are clear. Orbits are unremarkable. Other: Mastoid air cells and middle ear cavities are clear. IMPRESSION: No acute intracranial abnormality. Mild sphenoid sinus disease. Electronically Signed   By: Fidela Salisbury M.D.   On: 07/29/2021 00:46   CT Lumbar Spine Wo Contrast  Result Date: 07/29/2021 CLINICAL DATA:  Low back pain, progressive neurologic deficit. Remote history of thoracolumbar spine fracture. EXAM: CT LUMBAR SPINE WITHOUT CONTRAST TECHNIQUE: Multidetector CT imaging of the lumbar spine was performed without intravenous contrast administration. Multiplanar CT image reconstructions were also generated. COMPARISON:  Lumbar radiographs 03/15/2021 FINDINGS: Segmentation: 5 lumbar type vertebrae. Alignment: Normal lumbar lordosis. Mild focal kyphosis at T12-L1 secondary to L1 compression deformity, stable since prior examination Vertebrae: Mild compression deformity of T12 with changes of vertebral augmentation are again identified. Chronic compression deformity of L1 is again identified with approximately 50% loss of height, unchanged. No retropulsion. Remaining vertebral body height is preserved. No acute fracture or listhesis. No lytic or blastic bone lesion identified. Paraspinal and other soft tissues: Negative. Disc levels: Review of the axial images demonstrates: T12-L1: Mild focal kyphosis. Mild degenerative disc disease with intervertebral disc space narrowing. No significant neuroforaminal narrowing. No significant canal stenosis. L1-2: Disc space preserved. No significant neuroforaminal narrowing. No canal stenosis. L2-3: Disc space preserved. No significant neuroforaminal narrowing. No canal stenosis. L3-4: Mild broad-based disc bulge. No significant neuroforaminal narrowing. No  significant canal stenosis. No significant facet arthrosis. L4-5: Moderate posterior broad-based disc osteophyte complex abuts and mildly remodels the thecal sac. Mild hypertrophy of the lamina propria. Minimal central canal stenosis. No significant neuroforaminal narrowing. L5-S1: Disc space preserved. No significant canal stenosis. No neuroforaminal narrowing. IMPRESSION: No acute fracture or listhesis of the lumbar spine. Stable changes of T12 vertebroplasty and L1 compression fracture with approximately 50% loss of height and mild resultant focal kyphosis at T12-L1. Electronically Signed   By: Fidela Salisbury M.D.   On: 07/29/2021 01:00   MR BRAIN WO CONTRAST  Result Date: 07/29/2021 CLINICAL DATA:  35 year old female with right leg weakness and numbness. EXAM: MRI HEAD WITHOUT CONTRAST TECHNIQUE:  Multiplanar, multiecho pulse sequences of the brain and surrounding structures were obtained without intravenous contrast. COMPARISON:  Head CT 0029 hours today.  Brain MRI 01/27/2021. FINDINGS: Brain: No restricted diffusion to suggest acute infarction. No midline shift, mass effect, evidence of mass lesion, ventriculomegaly, extra-axial collection or acute intracranial hemorrhage. Cervicomedullary junction and pituitary are within normal limits. Pearline Cables and white matter signal is stable and within normal limits throughout the brain. No encephalomalacia or chronic cerebral blood products identified. Vascular: Major intracranial vascular flow voids are stable since February. Skull and upper cervical spine: Visualized bone marrow signal is within normal limits. Stable and negative visible cervical spine. Sinuses/Orbits: Negative orbits. New mucosal thickening or small mucous retention cysts in the sphenoid sinuses since February. Other paranasal sinuses and mastoids are clear. Other: Grossly normal visible internal auditory structures. Negative visible scalp and face. IMPRESSION: No acute intracranial abnormality.  Stable and normal noncontrast MRI appearance of the brain. Electronically Signed   By: Genevie Ann M.D.   On: 07/29/2021 06:18   MR LUMBAR SPINE WO CONTRAST  Result Date: 07/29/2021 CLINICAL DATA:  Low back pain and weakness. Numbness. Rule out cord compression. EXAM: MRI LUMBAR SPINE WITHOUT CONTRAST TECHNIQUE: Multiplanar, multisequence MR imaging of the lumbar spine was performed. No intravenous contrast was administered. COMPARISON:  CT lumbar spine 07/29/2021, MR lumbar spine 06/26/2020 and 06/11/2017 FINDINGS: Segmentation:  Standard. Alignment:  No static listhesis. Mild kyphosis centered at T12-L1. Vertebrae: No acute fracture, evidence of discitis, or aggressive bone lesion. Mild chronic T12 vertebral body compression fracture with methylmethacrylate within the T12 vertebral body from prior augmentation. Chronic L1 vertebral body compression fracture with approximately 60% height loss. 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 Conus medullaris and cauda equina: Conus extends to the T12 level. Conus and cauda equina appear normal. Paraspinal and other soft tissues: No acute paraspinal abnormality. Disc levels: Disc spaces: T12-L1: Degenerative disease with disc height loss at L3-4 and L4-5. L1-L2: No significant disc bulge. No neural foraminal stenosis. No central canal stenosis. L2-L3: No significant disc bulge. No neural foraminal stenosis. No central canal stenosis. L3-L4: Minimal broad-based disc bulge with a right paracentral annular fissure. No foraminal or central canal stenosis. L4-L5: Broad-based disc bulge with a small central/right paracentral disc protrusion. No foraminal or central canal stenosis. L5-S1: No significant disc bulge. No neural foraminal stenosis. No central canal stenosis. IMPRESSION: 1. No acute osseous injury of the lumbar spine. 2. 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 3. No  evidence of cord compression. Electronically Signed   By: Kathreen Devoid M.D.   On: 07/29/2021 06:15     MDM   Patient presents for MRI from [facility].  Pain control given here in the emergency department.  Pending images.  6:24 AM MRI shows Mild lumbar spine spondylosis no evidence of cauda equina.  Patient will be discharged home to follow-up with neurosurgery outpatient.  BP 104/76 (BP Location: Right Arm)   Pulse 79   Temp 97.9 F (36.6 C) (Oral)   Resp 18   SpO2 100%       Agapito Games 07/29/21 [id number]    Merryl Hacker, MD 07/29/21 [id number]

## 2021-07-30 ENCOUNTER — Telehealth: Payer: Self-pay

## 2021-07-30 NOTE — Telephone Encounter (Signed)
Transition Care Management Follow-up Telephone Call Date of discharge and from where: 07/29/2021[facility]  How have you been since you were released from the hospital? Patient stated she is doing ok. Any questions or concerns? No  Items Reviewed: Did the pt receive and understand the discharge instructions provided? Yes  Medications obtained and verified?  No medications sent to pharmacy. Other? No  Any new allergies since your discharge? No  Dietary orders reviewed? N.A Do you have support at home? Yes   Home Care and Equipment/Supplies: Were home health services ordered? not applicable If so, what is the name of the agency? N/A  Has the agency set up a time to come to the patient's home? not applicable Were any new equipment or medical supplies ordered?  No What is the name of the medical supply agency? N/A Were you able to get the supplies/equipment? not applicable Do you have any questions related to the use of the equipment or supplies? No  Functional Questionnaire: (I = Independent and D = Dependent) ADLs: I  Bathing/Dressing- I  Meal Prep- I  Eating- I  Maintaining continence- I  Transferring/Ambulation- I  Managing Meds- I  Follow up appointments reviewed:  PCP Hospital f/u appt confirmed? No   Specialist Hospital f/u appt confirmed? Yes  Scheduled to see Dr. Posey Pronto. Patient stated she is on a wait list.  Are transportation arrangements needed? No  If their condition worsens, is the pt aware to call PCP or go to the Emergency Dept.? Yes Was the patient provided with contact information for the PCP's office or ED? Yes Was to pt encouraged to call back with questions or concerns? Yes

## 2021-07-31 ENCOUNTER — Ambulatory Visit (HOSPITAL_COMMUNITY): Payer: Medicaid Other | Admitting: Clinical

## 2021-07-31 ENCOUNTER — Other Ambulatory Visit: Payer: Self-pay

## 2021-08-03 ENCOUNTER — Telehealth: Payer: Self-pay | Admitting: Neurology

## 2021-08-03 ENCOUNTER — Telehealth: Payer: Self-pay

## 2021-08-03 NOTE — Telephone Encounter (Signed)
New message   Pending   Determination Wait for Determination  Please wait for [facility] [facility] to return a determination.   Penny [name] (Key: [id number]) Aimovig '70MG'$ /ML auto-injectors   Form [facility] Electronic [place] Form [phone] NCPDP) Created 4 days ago Sent to Plan 17 minutes ago Plan Response 17 minutes ago Submit Clinical Questions less than a minute ago

## 2021-08-03 NOTE — Telephone Encounter (Signed)
Pt called in and scheduled a follow up for 10/14/21. She stated she went to the [facility] and they sent her Emergency department 07/28/21. She stated her right leg is numb from the knee down and is cold to the touch. She wants to know what to do until her appointment?

## 2021-08-04 NOTE — Telephone Encounter (Signed)
Right foot is numb competely, gotten worse per patient, her PCP said to follow up here. Please advise, patient is concerned and is taken cymbalta '60mg'$  daily. Patient states unable to take Lyrica nor gabapentin. She is highly upset, will call back after recommendations.

## 2021-08-04 NOTE — Telephone Encounter (Signed)
Please schedule patient thank you

## 2021-08-04 NOTE — Telephone Encounter (Signed)
F/u  Noticed from [place] prior authorization request Aimovig 70 mg - Denied.   Resubmit prior authorization  Wait for Determination Please wait for [facility] to return a determination.

## 2021-08-04 NOTE — Telephone Encounter (Signed)
OK to use my next available work-in follow-up (9/29) and add to wait list.  I do not have anything available sooner.  I will need to evaluate her in the office to give recommendations in her care.

## 2021-08-05 NOTE — Telephone Encounter (Signed)
Pt Is sch for 09-03-21

## 2021-08-09 ENCOUNTER — Other Ambulatory Visit: Payer: Self-pay

## 2021-08-09 ENCOUNTER — Ambulatory Visit (INDEPENDENT_AMBULATORY_CARE_PROVIDER_SITE_OTHER): Payer: Medicaid Other

## 2021-08-09 ENCOUNTER — Ambulatory Visit
Admission: EM | Admit: 2021-08-09 | Discharge: 2021-08-09 | Disposition: A | Discharge status: Home / Self Care | Payer: Medicaid Other | Attending: Internal Medicine | Admitting: Internal Medicine

## 2021-08-09 ENCOUNTER — Ambulatory Visit: Payer: Medicaid Other

## 2021-08-09 DIAGNOSIS — M25571 Pain in right ankle and joints of right foot: Secondary | ICD-10-CM | POA: Diagnosis not present

## 2021-08-09 DIAGNOSIS — S93401A Sprain of unspecified ligament of right ankle, initial encounter: Secondary | ICD-10-CM | POA: Diagnosis not present

## 2021-08-09 DIAGNOSIS — S99911A Unspecified injury of right ankle, initial encounter: Secondary | ICD-10-CM | POA: Diagnosis not present

## 2021-08-09 DIAGNOSIS — W1840XA Slipping, tripping and stumbling without falling, unspecified, initial encounter: Secondary | ICD-10-CM | POA: Diagnosis not present

## 2021-08-09 NOTE — ED Provider Notes (Signed)
[facility]    CSN: [id number] Arrival date & time: 08/09/21  1157      History   Chief Complaint Chief Complaint  Patient presents with   Ankle Pain    HPI Ramesha Poster is a 35 y.o. female with history of peripheral neuropathy involving the right lower extremity comes to urgent care with right ankle pain.  Patient twisted her ankle last night.  Following that she had severe pain in the right ankle.  Pain is aggravated by movement.  No known relieving factors.  She has tried naproxen with no improvement in her symptoms.  No swelling, joint or bruising.  No radiation of the pain.  HPI  Past Medical History:  Diagnosis Date   ADD (attention deficit disorder)    Anemia 2011   2o to GASTRIC BYPASS   Anginal pain ([place])    Anxiety    Asthma    Blood transfusion without reported diagnosis    Chronic daily headache    Depression    Depression    Dysmenorrhea 09/18/2013   Dysrhythmia    Elevated liver enzymes JUL 2011ALK PHOS 111-127 AST  143-267 ALT  213-321T BILI 0.6  ALB  3.7-4.06 Jun 2011 ALK PHOS 118 AST 24 ALT 42 T BILI 0.4 ALB 3.9   Encounter for drug rehabilitation    at behavioral health for opioid addiction about 4 years ago   Family history of adverse reaction to anesthesia    'dad had to be kept on pump for breathing for morphine'   Fatty liver    Fibroid 01/18/2017   Fibromyalgia    Gastric bypass status for obesity    Gastritis JULY 2011   Heart murmur    Hereditary and idiopathic peripheral neuropathy 01/15/2015   History of Holter monitoring    Hx of opioid abuse ([place])    for about 4 years, about 4 years ago   Interstitial cystitis    Iron deficiency anemia 07/23/2010   Irritable bowel syndrome 2012 DIARRHEA   JUN 2012 TTG IgA 14.9   IUD FEB 2010   Lupus ([place])    Menorrhagia 07/18/2013   Migraines    Obesity (BMI 30-39.9) 2011 228 LBS BMI 36.8   Osteoporosis    Ovarian cyst    Patient desires pregnancy 09/18/2013   Polyneuropathy    PONV  (postoperative nausea and vomiting) seizure post-operatively   seizure following ankle surgery   Potassium (K) deficiency    Pregnant 12/25/2013   Psychiatric pseudoseizure    RLQ abdominal pain 07/18/2013   Sciatica of left side 11/14/2014   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   Stress 09/18/2013    Patient Active Problem List   Diagnosis Date Noted   Chronic prescription opiate use 12/25/2020   Sphincter of Oddi dysfunction 12/25/2020   Constipation 12/25/2020   Major depressive disorder, recurrent episode ([place]) 06/22/2020   Major depression 06/19/2020   Dehydration    Chronic abdominal pain 09/19/2019   Gastroesophageal reflux disease without esophagitis    Cannabis abuse 08/30/2019   Dyspnea    Severe anxiety 08/21/2019   Absolute anemia 04/23/2019   Urge incontinence 10/26/2018   S/P kyphoplasty 08/02/2018   Herpes genitalis in women 12/19/2017   Cannabis use disorder, moderate, dependence ([place]) 07/07/2017   Abnormal urine odor 03/30/2017   Urinary tract infection with hematuria 03/30/2017   Screening for genitourinary condition 03/30/2017   Pain 03/30/2017   Burning with urination 03/30/2017   Family history  of breast cancer in mother,uncertain BR CA status 02/28/2017   Fibroid 01/18/2017   Chronic pain syndrome 12/05/2016   Folate deficiency 12/04/2016   Anastomotic ulcer 04/22/2016   Pain medication agreement signed 04/22/2016   Complaints of leg weakness    Paresthesia    Left-sided weakness    Uncontrolled pain 01/24/2016   Malnutrition of moderate degree 01/24/2016   Neuropathy 01/23/2016   Inability to walk 01/23/2016   Paresthesias    Bilateral leg numbness 11/26/2015   Major depressive disorder, recurrent episode, 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[place]) 05/10/2015   Post partum depression 05/09/2015   Hematemesis 04/07/2015   Intractable vomiting with nausea 04/07/2015   Elevated liver enzymes    Epigastric pain    Pseudoseizure ([place]) 02/22/2015    Seizure-like activity ([place]) 02/22/2015   Fibromyalgia 02/18/2015   Vulvar fissure 02/18/2015   Clinical depression 02/01/2015   Current smoker 01/29/2015   Current tobacco use 01/29/2015   Hereditary and idiopathic peripheral neuropathy 01/15/2015   Leg weakness, bilateral 01/02/2015   Gait disorder 01/02/2015   Other symptoms and signs involving the musculoskeletal system 01/02/2015   Abnormal gait 01/02/2015   Cervicalgia 12/18/2014   Sciatica of left side 11/14/2014   Neuralgia neuritis, sciatic nerve 11/14/2014   Pseudoseizures ([place]) 09/12/2014   Encounter for sterilization 09/09/2014   Abdominal pain, acute, right lower quadrant 08/22/2014   Headache, migraine 08/19/2014   Seizure ([place]) 08/18/2014   Encephalopathy 08/13/2014   Headache 08/13/2014   Altered mental status 08/13/2014   Right leg numbness 07/31/2014   Disturbance of skin sensation 07/31/2014   Hypertension in pregnancy, transient 07/08/2014   Previous gastric bypass affecting pregnancy, antepartum 05/22/2014   History of bariatric surgery 05/22/2014   Patent foramen ovale with right to left shunt 05/17/2014   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   Antepartum mental disorder in pregnancy 05/09/2014   Rapid palpitations 02/18/2014   H/O maternal third degree perineal laceration, currently pregnant 02/18/2014   High-risk pregnancy 02/18/2014   Supervision of pregnancy with other poor reproductive or obstetric history, unspecified trimester 02/18/2014   Restless legs 01/16/2014   Restless leg 01/16/2014   Chronic interstitial cystitis 10/23/2013   Menorrhagia 07/18/2013   Excessive and frequent menstruation 07/18/2013   h/o Opiate addiction 03/11/2012    Class: Acute   History of migraine headaches 03/10/2012    Class: Acute   Depression with anxiety 03/10/2012    Class: Chronic   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 03/10/2012    Class: Chronic   Attention deficit hyperactivity  disorder, 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    Class: Chronic   Panic disorder without agoraphobia 03/10/2012   H/O disease 03/10/2012   Dysthymia 03/10/2012   Conversion disorder with seizures or convulsions 03/10/2012   Other specified behavioral and emotional disorders with onset usually occurring in childhood and adolescence 03/10/2012   Pelvic congestion syndrome 10/13/2011   Coitalgia 10/13/2011   Chronic migraine without aura 10/13/2011   Unspecified dyspareunia 10/13/2011   Other specified conditions associated with female genital organs and menstrual cycle 10/13/2011   IBS (irritable bowel syndrome) 08/25/2011   OBESITY, UNSPECIFIED 09/17/2010   Iron deficiency anemia 07/23/2010    Past Surgical History:  Procedure Laterality Date   BIOPSY  04/10/2015   Procedure: BIOPSY;  Surgeon: Daneil Dolin, MD;  Location: [facility];  Service: Endoscopy;;   BIOPSY  04/15/2021   Procedure: BIOPSY;  Surgeon: Harvel Quale, MD;  [facility];  Service: Gastroenterology;;  cath self every nite     for sodium bicarb injection (discontinued 2013)   CHOLECYSTECTOMY  2005   biliary dyskinesia   COLONOSCOPY  JUN 2012 ABD PN/DIARRHEA WITH PROPOFOL   NL COLON   COLONOSCOPY WITH PROPOFOL N/A 01/16/2021   Procedure: COLONOSCOPY WITH PROPOFOL;  Surgeon: Harvel Quale, MD;  [facility];  Service: Gastroenterology;  Laterality: N/A;  1:45   DILATION AND CURETTAGE OF UTERUS     DILITATION & CURRETTAGE/HYSTROSCOPY WITH NOVASURE ABLATION N/A 03/24/2017   Procedure: DILATATION & CURETTAGE/HYSTEROSCOPY WITH NOVASURE ENDOMETRIAL ABLATION;  Surgeon: Jonnie Kind, MD;  Location: [facility];  Service: Gynecology;  Laterality: N/A;   ESOPHAGEAL DILATION N/A 04/10/2015   Procedure: ESOPHAGEAL DILATION WITH 54FR MALONEY DILATOR;  Surgeon: Daneil Dolin, MD;  Location: [facility];  Service: Endoscopy;  Laterality: N/A;   ESOPHAGOGASTRODUODENOSCOPY      ESOPHAGOGASTRODUODENOSCOPY (EGD) WITH PROPOFOL N/A 04/10/2015   Procedure: ESOPHAGOGASTRODUODENOSCOPY (EGD) WITH PROPOFOL;  Surgeon: Daneil Dolin, MD;  [facility];  Service: Endoscopy;  Laterality: N/A;   ESOPHAGOGASTRODUODENOSCOPY (EGD) WITH PROPOFOL N/A 12/06/2016   Procedure: ESOPHAGOGASTRODUODENOSCOPY (EGD) WITH PROPOFOL;  Surgeon: Danie Binder, MD;  [facility];  Service: Endoscopy;  Laterality: N/A;   ESOPHAGOGASTRODUODENOSCOPY (EGD) WITH PROPOFOL N/A 04/27/2019   Procedure: ESOPHAGOGASTRODUODENOSCOPY (EGD) WITH PROPOFOL;  Surgeon: Rogene Houston, MD;  [facility];  Service: Endoscopy;  Laterality: N/A;  730   ESOPHAGOGASTRODUODENOSCOPY (EGD) WITH PROPOFOL N/A 08/31/2019   Procedure: ESOPHAGOGASTRODUODENOSCOPY (EGD) WITH PROPOFOL;  Surgeon: Rogene Houston, MD;  [facility];  Service: Endoscopy;  Laterality: N/A;   ESOPHAGOGASTRODUODENOSCOPY (EGD) WITH PROPOFOL N/A 01/16/2021   Procedure: ESOPHAGOGASTRODUODENOSCOPY (EGD) WITH PROPOFOL;  Surgeon: Harvel Quale, MD;  [facility];  Service: Gastroenterology;  Laterality: N/A;   ESOPHAGOGASTRODUODENOSCOPY (EGD) WITH PROPOFOL N/A 04/15/2021   Procedure: ESOPHAGOGASTRODUODENOSCOPY (EGD) WITH PROPOFOL;  Surgeon: Harvel Quale, MD;  [facility];  Service: Gastroenterology;  Laterality: N/A;  9:00 AM   GAB  2007   in [place]-POUCH 5 CM   GASTRIC BYPASS  06/2006   HYSTEROSCOPY WITH D & C N/A 09/12/2014   Procedure: DILATATION AND CURETTAGE /HYSTEROSCOPY;  Surgeon: Jonnie Kind, MD;  [facility];  Service: Gynecology;  Laterality: N/A;   KYPHOPLASTY N/A 08/02/2018   Procedure: KYPHOPLASTY T12;  Surgeon: Melina Schools, MD;  Location: [facility];  Service: Orthopedics;  Laterality: N/A;  60 mins   LAPAROSCOPIC TUBAL LIGATION Bilateral 03/24/2017   Procedure: LAPAROSCOPIC TUBAL LIGATION ([place] Rings);  Surgeon: Jonnie Kind, MD;  Location: [facility];  Service:  Gynecology;  Laterality: Bilateral;   REPAIR VAGINAL CUFF N/A 07/30/2014   Procedure: REPAIR VAGINAL CUFF;  Surgeon: Mora Bellman, MD;  Location: [facility];  Service: Gynecology;  Laterality: N/A;   SAVORY DILATION  06/20/2012   Dr. Barnie Alderman gastritis/Ulcer in the mid jejunum. Empiric dilation.    SURAL NERVE BX Left 02/25/2016   Procedure: LEFT SURAL NERVE BIOPSY;  Surgeon: Jovita Gamma, MD;  Location: [facility];  Service: Neurosurgery;  Laterality: Left;  Left sural nerve biopsy   TONSILLECTOMY     TONSILLECTOMY AND ADENOIDECTOMY     UPPER GASTROINTESTINAL ENDOSCOPY  JULY 2011 NAUSEA-D125,V6, PH 25   Bx; GASTRITIS, POUCH-5 CM LONG   WISDOM TOOTH EXTRACTION      OB History     Gravida  3   Para  2   Term  1   Preterm  1   AB  1   Living  2      SAB  1   IAB      Ectopic      Multiple      Live Births  2            Home Medications    Prior to Admission medications   Medication Sig Start Date End Date Taking? Authorizing Provider  amitriptyline (ELAVIL) 50 MG tablet Take 1 tablet (50 mg total) by mouth at bedtime. 07/13/21   Cloria Spring, MD  amphetamine-dextroamphetamine (ADDERALL XR) 30 MG 24 hr capsule Take 1 capsule (30 mg total) by mouth every morning. 07/13/21   Cloria Spring, MD  amphetamine-dextroamphetamine (ADDERALL XR) 30 MG 24 hr capsule Take 1 capsule (30 mg total) by mouth every morning. 07/13/21   Cloria Spring, MD  busPIRone (BUSPAR) 10 MG tablet Take 1 tablet (10 mg total) by mouth 3 (three) times daily. 07/13/21   Cloria Spring, MD  Calcium Carb-Cholecalciferol (CALCIUM-VITAMIN D3) 600-500 MG-UNIT CAPS Take 1 tablet by mouth daily.    [provider]  Carboxymethylcellul-Glycerin (LUBRICATING EYE DROPS OP) Place 1 drop into both eyes daily as needed (dry eyes).    [provider]  cetirizine (ZYRTEC) 10 MG tablet TAKE ONE TABLET BY MOUTH DAILY Patient taking differently: Take 10 mg by mouth daily. 03/17/21   Valentina Shaggy, MD  clidinium-chlordiazePOXIDE (LIBRAX) 5-2.5 MG capsule Take 1 capsule by mouth 3 (three) times daily before meals. 03/17/21   Harvel Quale, MD  clonazePAM (KLONOPIN) 0.5 MG tablet Take 1 tablet (0.5 mg total) by mouth 2 (two) times daily as needed. 07/13/21   Cloria Spring, MD  cyclobenzaprine (FLEXERIL) 10 MG tablet Take 1 tablet (10 mg total) by mouth 3 (three) times daily as needed for muscle spasms. 07/24/21   Hassell Done Mary-Margaret, FNP  DULoxetine (CYMBALTA) 60 MG capsule Take 1 capsule (60 mg total) by mouth 2 (two) times daily. 07/13/21   Cloria Spring, MD  fluconazole (DIFLUCAN) 150 MG tablet One po qd prn yeast infection; may repeat in 3-4 days if needed Patient taking differently: Take 150 mg by mouth See admin instructions. One po qd prn yeast infection; may repeat in 3-4 days if needed 01/29/21   Kathyrn Drown, MD  fluticasone (FLOVENT HFA) 220 MCG/ACT inhaler Inhale 2 puffs into the lungs 2 (two) times daily. Do not inhale, swallow powder and drink water , do not eat food for 20 minutes after puffs 04/23/21   Montez Morita, Quillian Quince, MD  HYDROmorphone (DILAUDID) 4 MG tablet Take 4 mg by mouth every 4 (four) hours as needed for severe pain.    [provider]  lansoprazole (PREVACID) 30 MG capsule Take 1 capsule (30 mg total) by mouth daily. 04/15/21   Harvel Quale, MD  levalbuterol [facility] HFA) 45 MCG/ACT inhaler INHALE (2) PUFFS INTO THE LUNGS EVERY SIX HOURS AS NEEDED FOR WHEEZING. Patient taking differently: Inhale 2 puffs into the lungs every 6 (six) hours as needed for wheezing or shortness of breath. INHALE (2) PUFFS INTO THE LUNGS EVERY SIX HOURS AS NEEDED FOR WHEEZING. 02/24/21   Valentina Shaggy, MD  levalbuterol [facility]) 1.25 MG/3ML nebulizer solution Take 1.25 mg by nebulization every 4 (four) hours as needed for wheezing. 02/24/21   Valentina Shaggy, MD  linaclotide [facility]) 290 MCG CAPS capsule Take 1 capsule (290 mcg  total) by mouth daily before breakfast. 03/26/21  Montez Morita, Quillian Quince, MD  meclizine (ANTIVERT) 25 MG tablet Take 1 tablet (25 mg total) by mouth 3 (three) times daily as needed for dizziness. 05/07/21   Mar Daring, PA-C  naloxegol oxalate (MOVANTIK) 12.5 MG TABS tablet Take 2 tablets (25 mg total) by mouth daily. 03/26/21   Harvel Quale, MD  naproxen (NAPROSYN) 500 MG tablet Take 1 tablet (500 mg total) by mouth 2 (two) times daily with a meal. 07/24/21   Hassell Done, Mary-Margaret, FNP  ondansetron (ZOFRAN) 8 MG tablet Take 1 tablet (8 mg total) by mouth every 8 (eight) hours as needed for nausea. 01/07/21   Kathyrn Drown, MD  pantoprazole (PROTONIX) 40 MG tablet TAKE 1 TABLET BY MOUTH TWICE DAILY BEFORE MEALS. 07/09/21   Kathyrn Drown, MD  Pediatric Multivit-Minerals-C (FLINTSTONES GUMMIES) chewable tablet Chew 3 tablets by mouth daily.    [provider]  promethazine (PHENERGAN) 12.5 MG tablet TAKE 1 TABLET BY MOUTH EVERY 8 HOURS AS NEEDED FOR NAUSEA OR VOMITING Patient taking differently: Take 12.5 mg by mouth every 8 (eight) hours as needed for nausea or vomiting. 03/17/21   Montez Morita, Quillian Quince, MD  promethazine (PHENERGAN) 25 MG tablet Take 25 mg by mouth 2 (two) times daily as needed for nausea or vomiting.    [provider]  rOPINIRole (REQUIP) 1 MG tablet Take one tablet po each evening. Patient taking differently: Take 1 mg by mouth at bedtime. 10/27/20   Kathyrn Drown, MD  sucralfate (CARAFATE) 1 g tablet Take 1 tablet (1 g total) by mouth 4 (four) times daily -  with meals and at bedtime. 03/26/21 04/25/21  Harvel Quale, MD  valACYclovir (VALTREX) 1000 MG tablet TAKE 1 TABLET BY MOUTH DAILY. Patient taking differently: Take 1,000 mg by mouth daily as needed (fever blisters). 08/08/20   Estill Dooms, NP  Vitamin D, Ergocalciferol, (DRISDOL) 1.25 MG (50000 UNIT) CAPS capsule Take 50,000 Units by mouth every Wednesday.     [provider]    Family History Family History  Problem Relation Age of Onset   Hemochromatosis Maternal Grandmother    Migraines Maternal Grandmother    Cancer Maternal Grandmother    Breast cancer Maternal Grandmother    Hypertension Father    Diabetes Father    Coronary artery disease Father    Migraines Paternal Grandmother    Breast cancer Paternal Grandmother    Cancer Mother        breast   Hemochromatosis Mother    Breast cancer Mother    Depression Mother    Anxiety disorder Mother    Coronary artery disease Paternal Grandfather    Anxiety disorder Brother    Bipolar disorder Brother    Healthy Daughter    Healthy Son     Social History Social History   Tobacco Use   Smoking status: Former    Packs/day: 0.25    Years: 6.00    Pack years: 1.50    Types: Cigarettes    Start date: 05/2018   Smokeless tobacco: Never  Vaping Use   Vaping Use: Some days  Substance Use Topics   Alcohol use: Not Currently    Alcohol/week: 0.0 standard drinks   Drug use: Not Currently     Allergies   Gabapentin, Metoclopramide hcl, Tramadol, Ativan [lorazepam], Latex, Lyrica [pregabalin], Iron, Sulfa antibiotics, Butrans [buprenorphine], Nucynta [tapentadol], Propofol, Tape, and Toradol [ketorolac tromethamine]   Review of Systems Review of Systems  Gastrointestinal: Negative.   Musculoskeletal:  Positive for arthralgias. Negative for gait problem and joint swelling.    Physical Exam Triage Vital Signs ED Triage Vitals [08/09/21 1220]  Enc Vitals Group     BP      Pulse      Resp      Temp      Temp src      SpO2      Weight      Height      Head Circumference      Peak Flow      Pain Score 7     Pain Loc      Pain Edu?      Excl. in [place]?    No data found.  Updated Vital Signs There were no vitals taken for this visit.  Visual Acuity Right Eye Distance:   Left Eye Distance:   Bilateral Distance:    Right Eye Near:   Left Eye Near:     Bilateral Near:     Physical Exam Vitals and nursing note reviewed.  Constitutional:      General: She is not in acute distress.    Appearance: She is not ill-appearing.  Cardiovascular:     Rate and Rhythm: Normal rate and regular rhythm.  Musculoskeletal:     Comments: Tenderness on palpation over the lateral aspect of the right ankle.  No tenderness on palpation of the medial or lateral malleoli.  No bruising over the ankle.  Neurological:     Mental Status: She is alert.     UC Treatments / Results  Labs (all labs ordered are listed, but only abnormal results are displayed) Labs Reviewed - No data to display  EKG   Radiology DG Ankle Complete Right  Result Date: 08/09/2021 CLINICAL DATA:  Tripping injury, ankle pain EXAM: RIGHT ANKLE - COMPLETE 3+ VIEW COMPARISON:  None. FINDINGS: Bones are osteopenic. Normal alignment. No acute osseous finding or displaced fracture. Malleoli, talus and calcaneus appear intact. No joint abnormality. IMPRESSION: Osteopenia.  No acute finding by plain radiography Electronically Signed   By: Jerilynn Mages.  Shick M.D.   On: 08/09/2021 12:31    Procedures Procedures (including critical care time)  Medications Ordered in UC Medications - No data to display  Initial Impression / Assessment and Plan / UC Course  I have reviewed the triage vital signs and the nursing notes.  Pertinent labs & imaging results that were available during my care of the patient were reviewed by me and considered in my medical decision making (see chart for details).     1.  Right ankle sprain: X-ray of the right ankle is negative for fracture Continue naproxen as needed for pain Ace wrap Gentle range of motion exercises Keep your appointment with the neurologist for peripheral neuropathy evaluation If symptoms worsen please call your neurologist to an appointment sooner.   Final Clinical Impressions(s) / UC Diagnoses   Final diagnoses:  Sprain of right ankle,  unspecified ligament, initial encounter   Discharge Instructions   None    ED Prescriptions   None    PDMP not reviewed this encounter.   Chase Picket, MD 08/09/21 1302

## 2021-08-09 NOTE — ED Triage Notes (Signed)
Pt presents with right ankle injury from fall a couple days ago

## 2021-08-11 IMAGING — CR DG CHEST 1V PORT
1 series · 1 of 1 positions shown · non-contrast
Comparison: 08/16/2019

CLINICAL DATA: Shortness of breath

EXAM:
PORTABLE CHEST 1 VIEW

[ap]
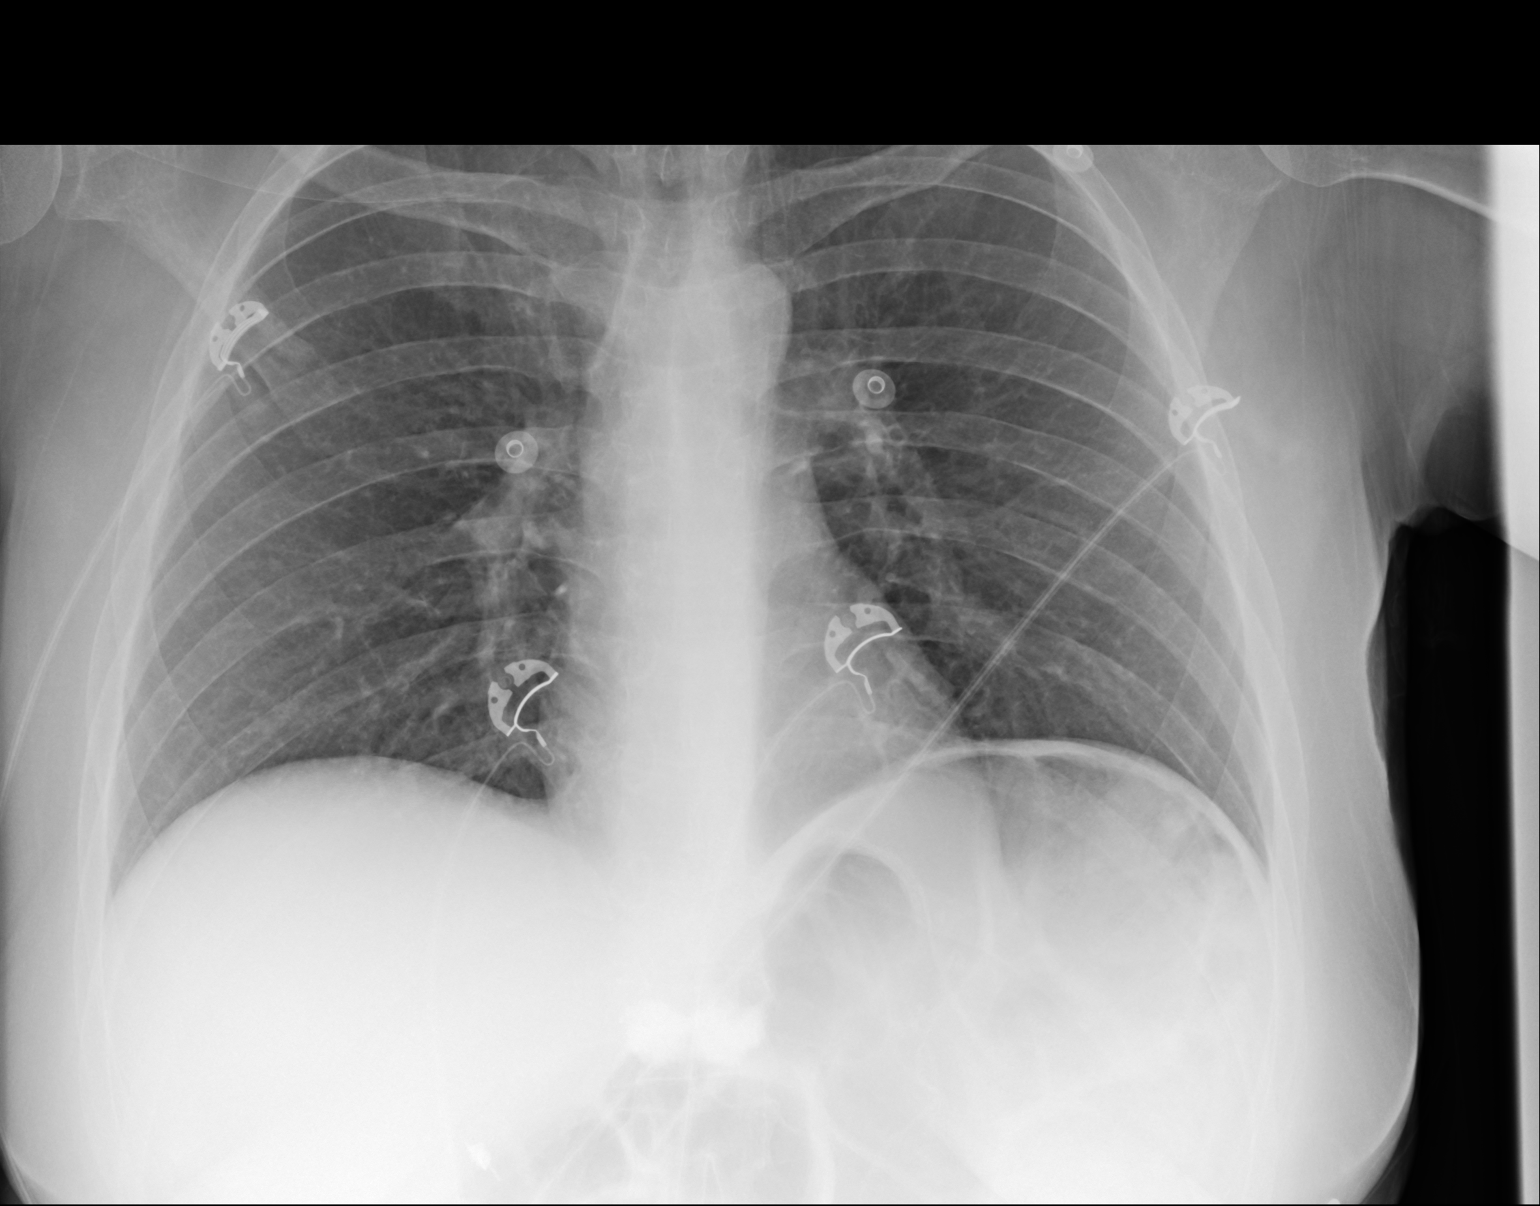

[1 of 1 positions shown; findings below may reference images not displayed]

FINDINGS: The heart size and mediastinal contours are within normal limits.
Both lungs are clear. The visualized skeletal structures are
unremarkable. Mild patient rotation to the right accentuates the
mediastinal markings.
IMPRESSION: No active disease.

## 2021-08-13 IMAGING — CT CT ABD-PELV W/ CM
2 of 4 series · 16 of 46 positions shown, 18 images · IV contrast (Omni 300)
Comparison: December 18, 2017.

CLINICAL DATA: Abdominal pain. Hernia.

EXAM:
CT ABDOMEN AND PELVIS WITH CONTRAST
TECHNIQUE: Multidetector CT imaging of the abdomen and pelvis was performed
using the standard protocol following bolus administration of
intravenous contrast.
CONTRAST:  100mL OMNIPAQUE IOHEXOL 300 MG/ML  SOLN

[Series 3: a/p w/ 5mm · axial · 0.95mm/px · z∈[+734,+1164]mm · 13 of 96 slices shown, 15 images]
[im 5/96  soft-tissue]
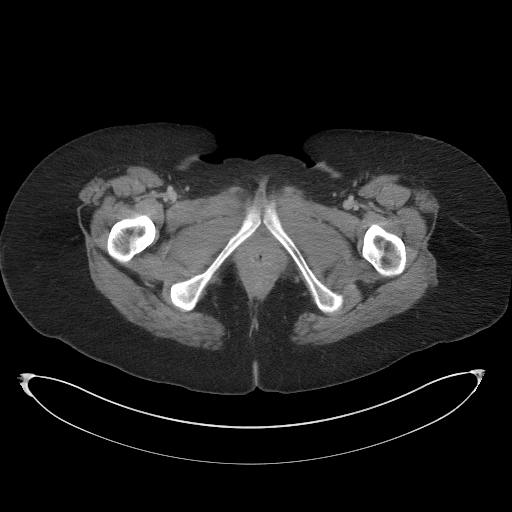
[im 5/96  bone]
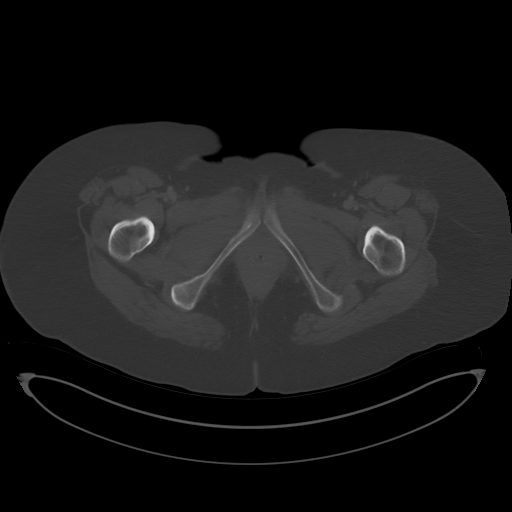
[im 13/96  soft-tissue]
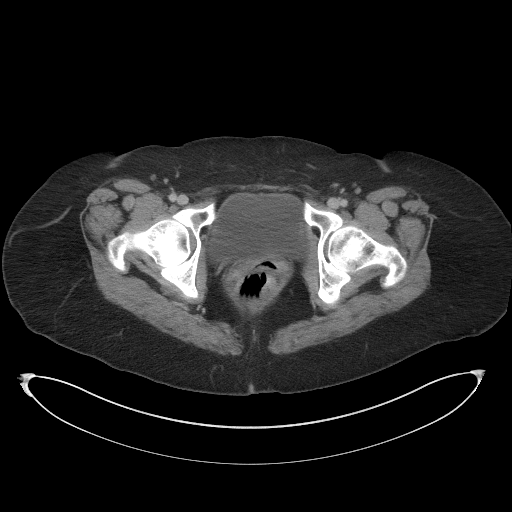
[im 22/96  soft-tissue]
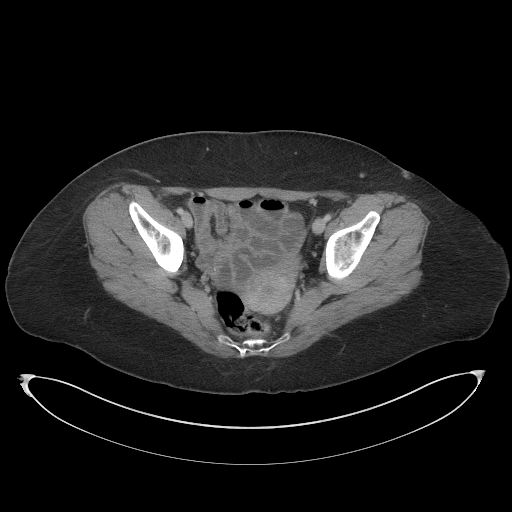
[im 26/96  soft-tissue]
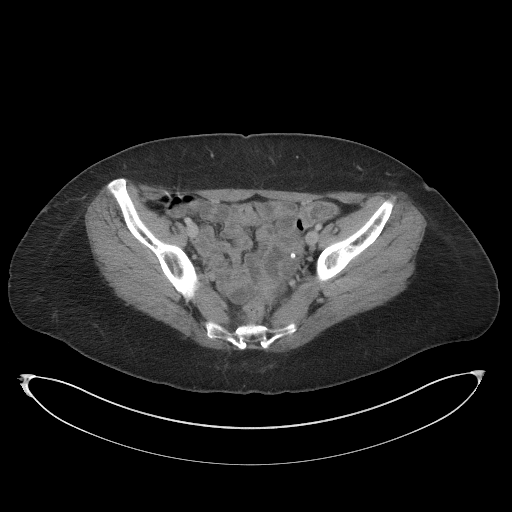
[im 35/96  soft-tissue]
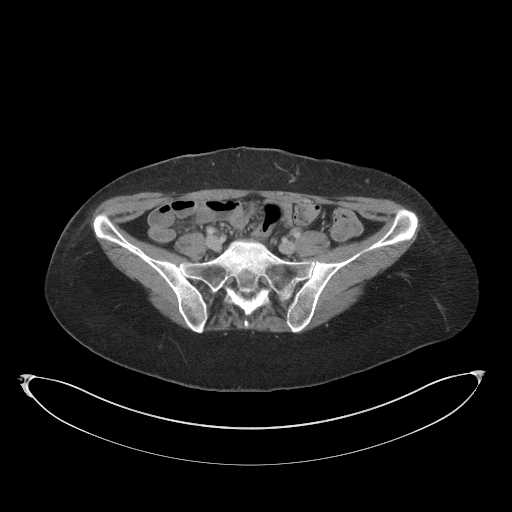
[im 39/96  soft-tissue]
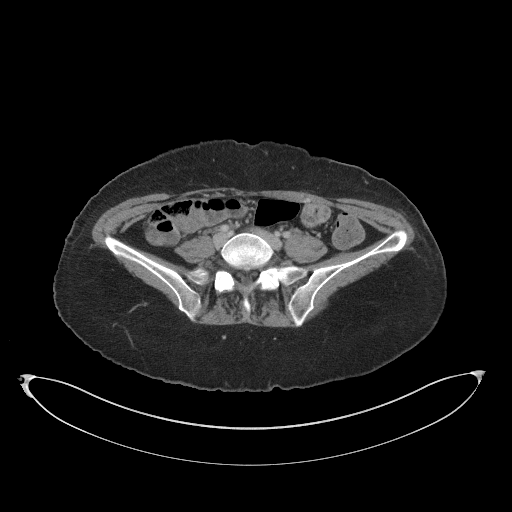
[im 48/96  soft-tissue]
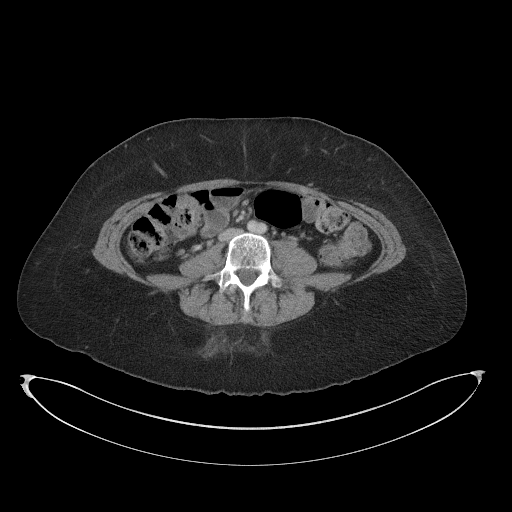
[im 57/96  soft-tissue]
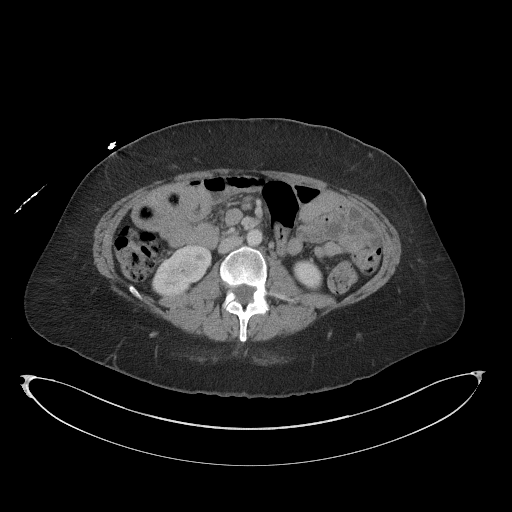
[im 61/96  soft-tissue]
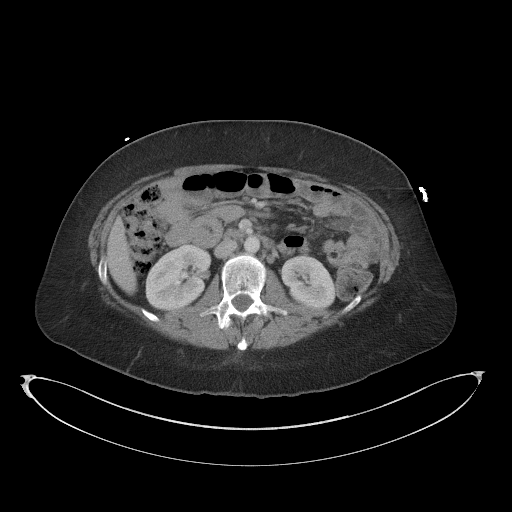
[im 61/96  bone]
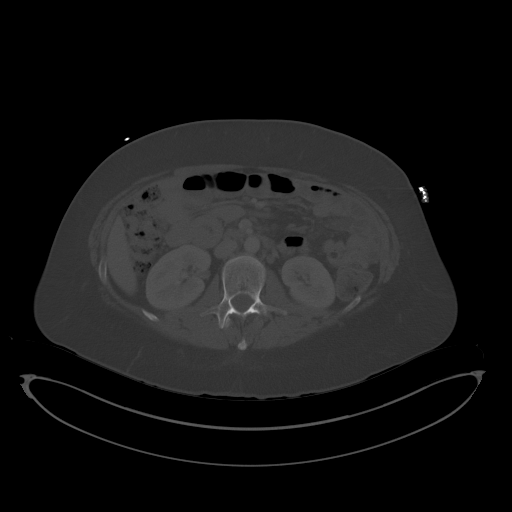
[im 70/96  soft-tissue]
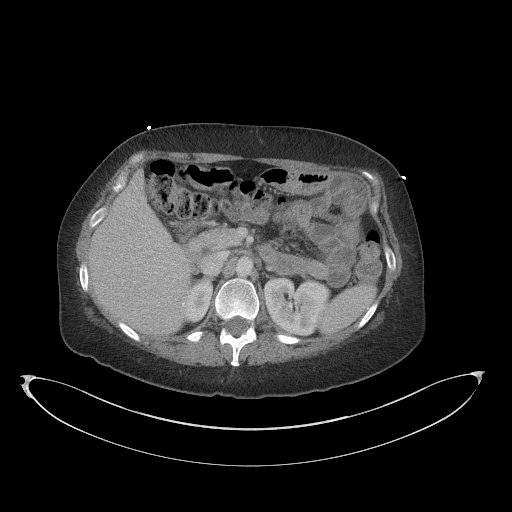
[im 74/96  soft-tissue]
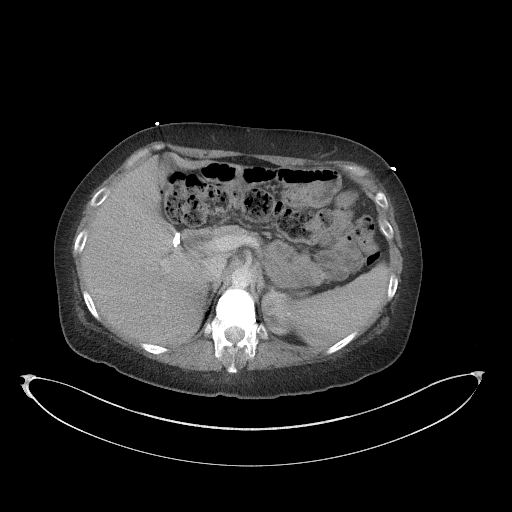
[im 83/96  soft-tissue]
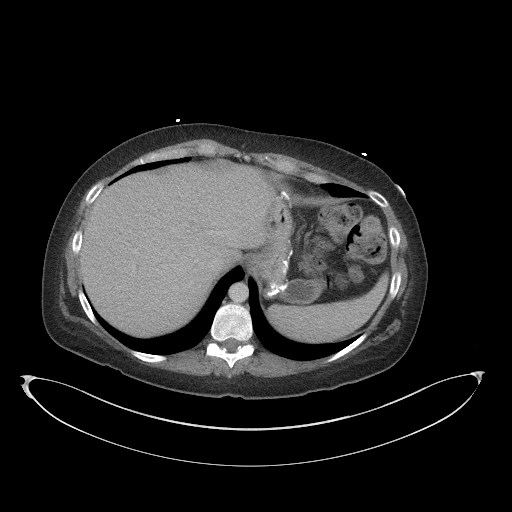
[im 91/96  soft-tissue]
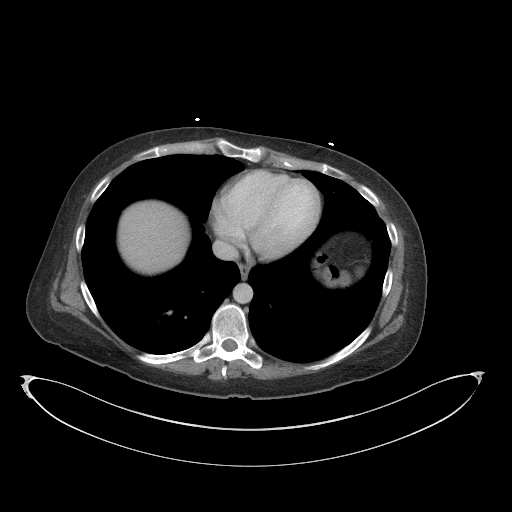

[Series 6: a/p w/ cor · coronal · 0.75mm/px · 3 of 129 slices shown]
[im 43/129  soft-tissue]
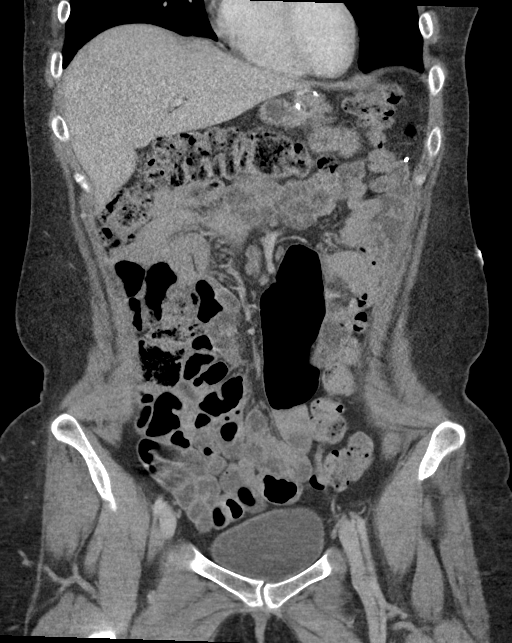
[im 57/129  soft-tissue]
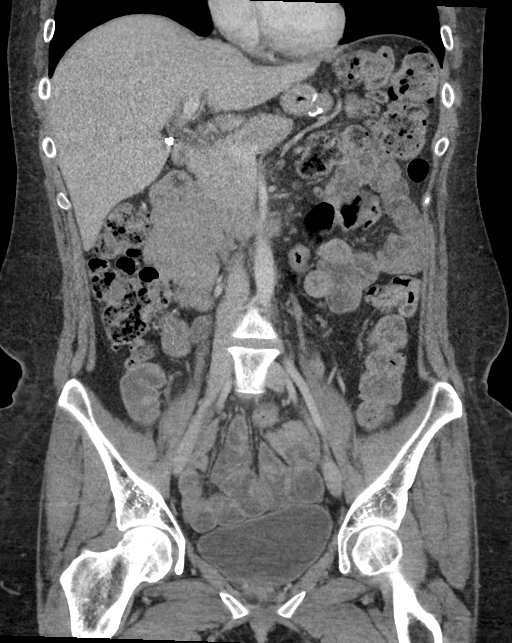
[im 72/129  soft-tissue]
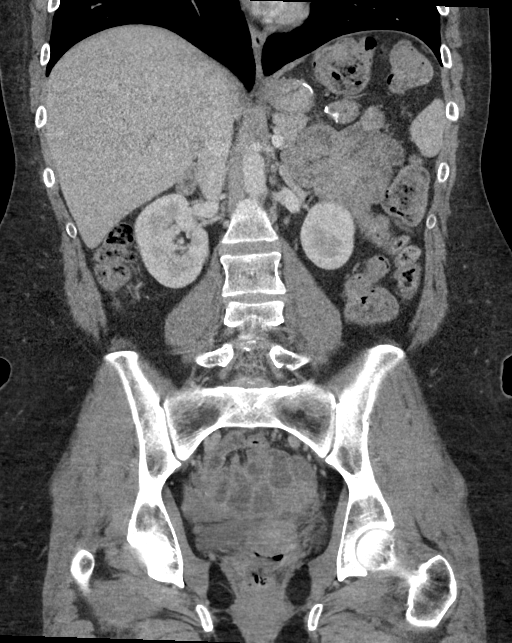

[16 of 46 positions shown; findings below may reference images not displayed]

FINDINGS: Lower chest: The lung bases are clear. The heart size is normal.

Hepatobiliary: The liver is normal. Status post
cholecystectomy.There is no biliary ductal dilation.

Pancreas: Normal contours without ductal dilatation. No
peripancreatic fluid collection.

Spleen: No splenic laceration or hematoma.

Adrenals/Urinary Tract:

--Adrenal glands: No adrenal hemorrhage.

--Right kidney/ureter: No hydronephrosis or perinephric hematoma.

--Left kidney/ureter: No hydronephrosis or perinephric hematoma.

--Urinary bladder: Unremarkable.

Stomach/Bowel:

--Stomach/Duodenum: The patient is status post prior gastric bypass.

--Small bowel: No dilatation or inflammation.

--Colon: No focal abnormality.

--Appendix: Normal.

Vascular/Lymphatic: Normal course and caliber of the major abdominal
vessels.

--No retroperitoneal lymphadenopathy.

--there are mildly prominent mesenteric lymph nodes.

--No pelvic or inguinal lymphadenopathy.

Reproductive: Unremarkable

Other: No ascites or free air. The abdominal wall is normal.

Musculoskeletal. The patient is status post prior vertebral
augmentation of the T12 vertebral body.
IMPRESSION: No acute abdominopelvic abnormality.

## 2021-08-14 DIAGNOSIS — M8580 Other specified disorders of bone density and structure, unspecified site: Secondary | ICD-10-CM | POA: Diagnosis not present

## 2021-08-14 DIAGNOSIS — E559 Vitamin D deficiency, unspecified: Secondary | ICD-10-CM | POA: Diagnosis not present

## 2021-08-15 IMAGING — US US ABDOMEN COMPLETE
1 series · 14 of 25 positions shown · non-contrast
Comparison: CT abdomen and pelvis 08/25/2019.

CLINICAL DATA: Abdominal pain and nausea for 2 weeks.

EXAM:
ABDOMEN ULTRASOUND COMPLETE

[Series 1: us abdomen complete · 0.22mm/px · 14 of 104 slices shown]
[im 1/104]
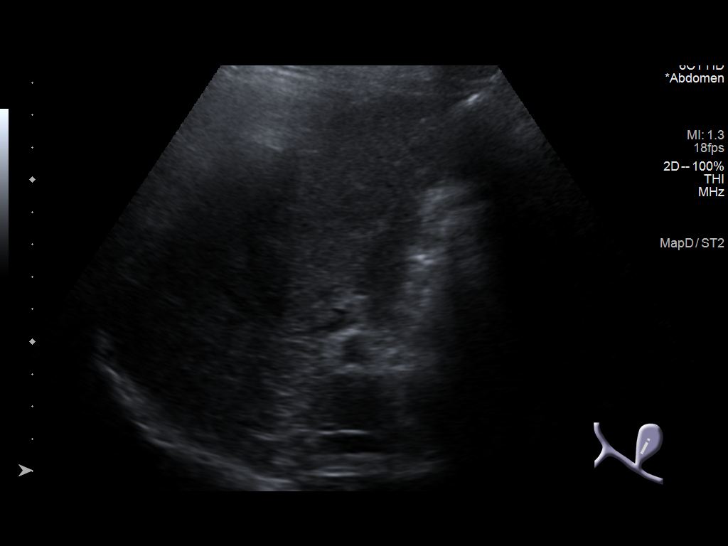
[im 9/104]
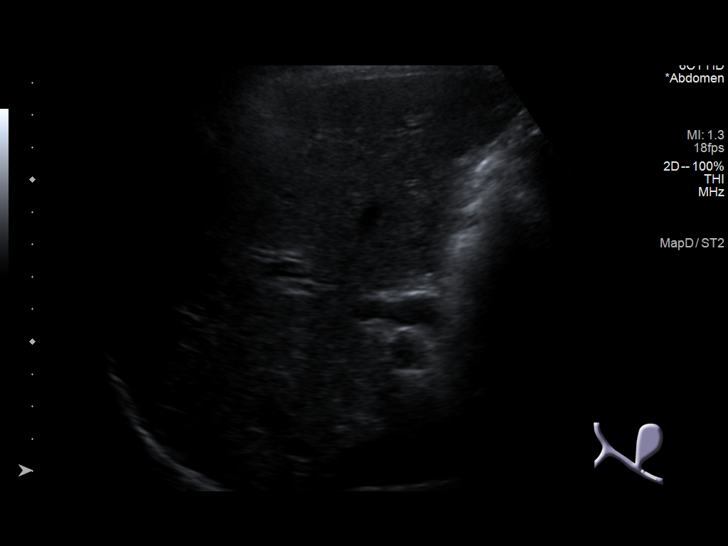
[im 18/104]
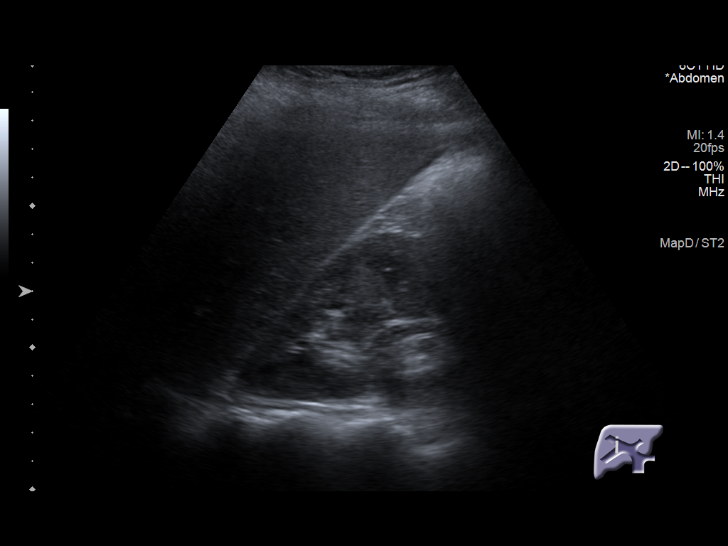
[im 26/104]
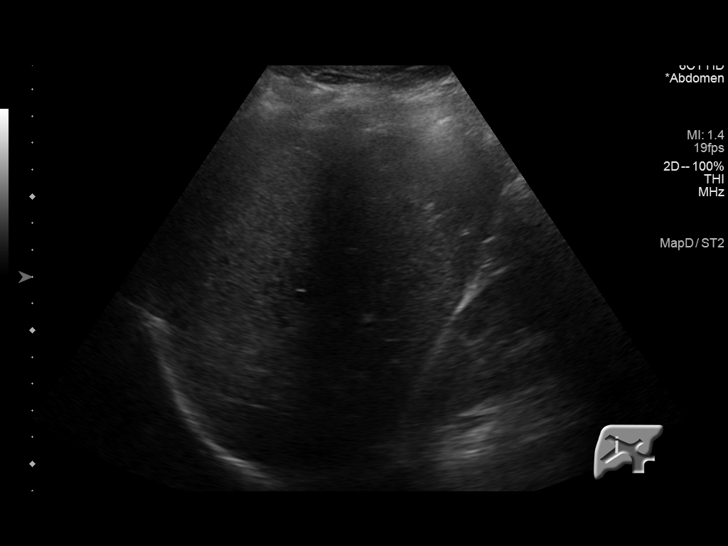
[im 35/104]
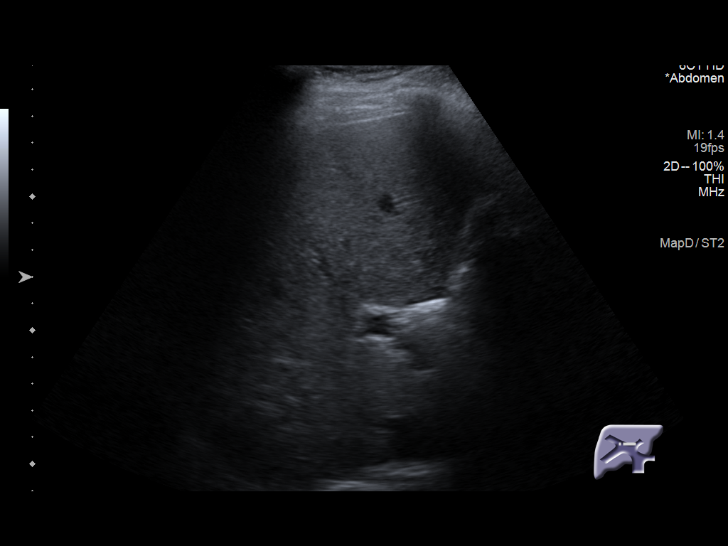
[im 39/104]
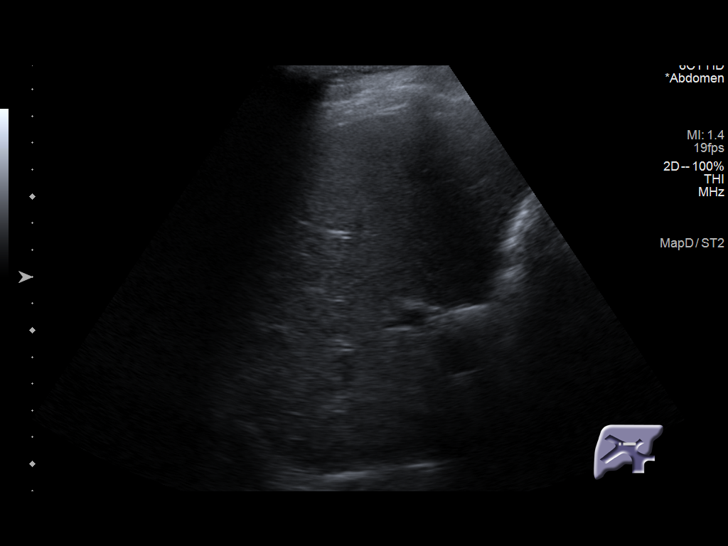
[im 48/104]
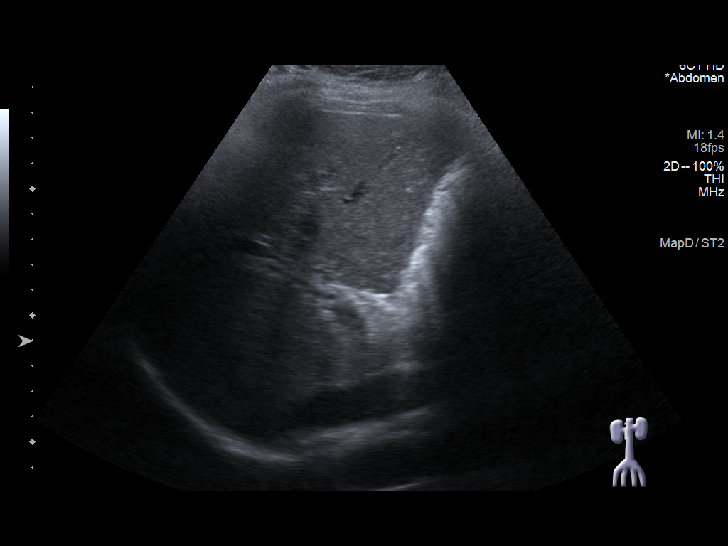
[im 56/104]
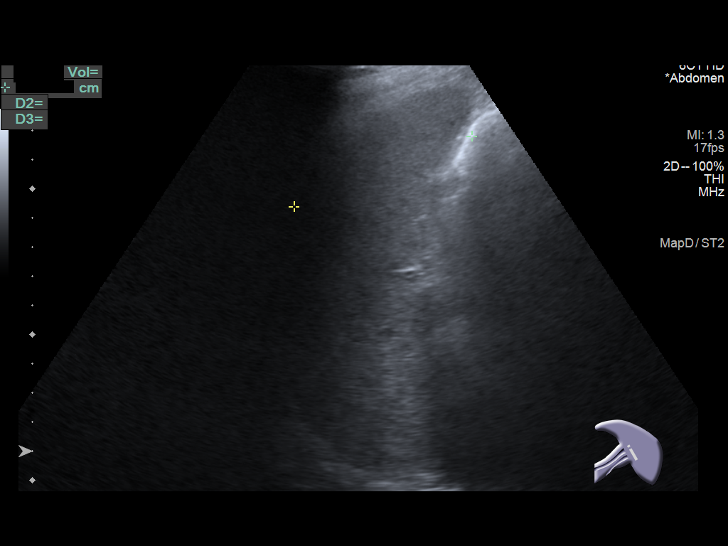
[im 65/104]
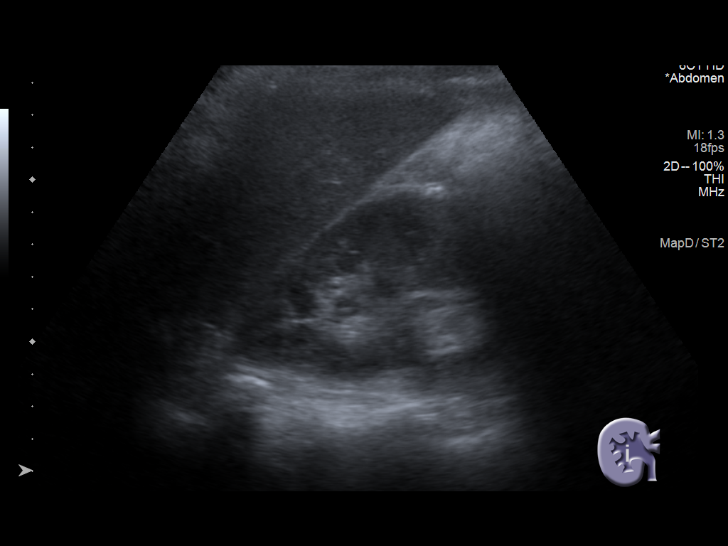
[im 69/104]
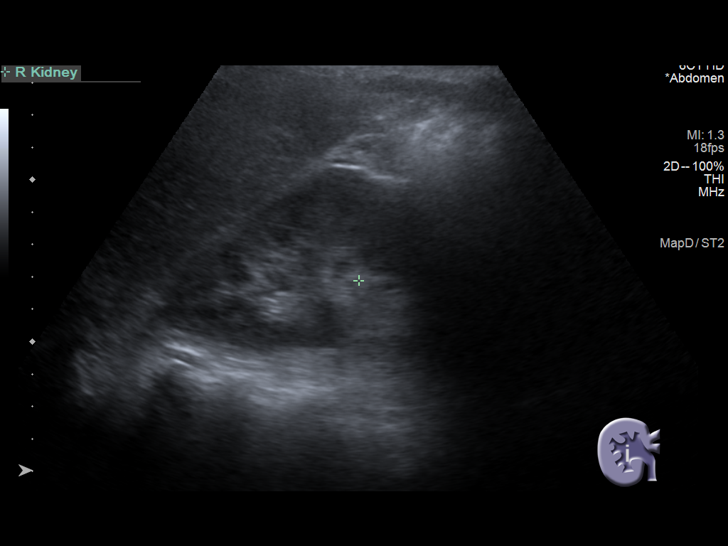
[im 78/104]
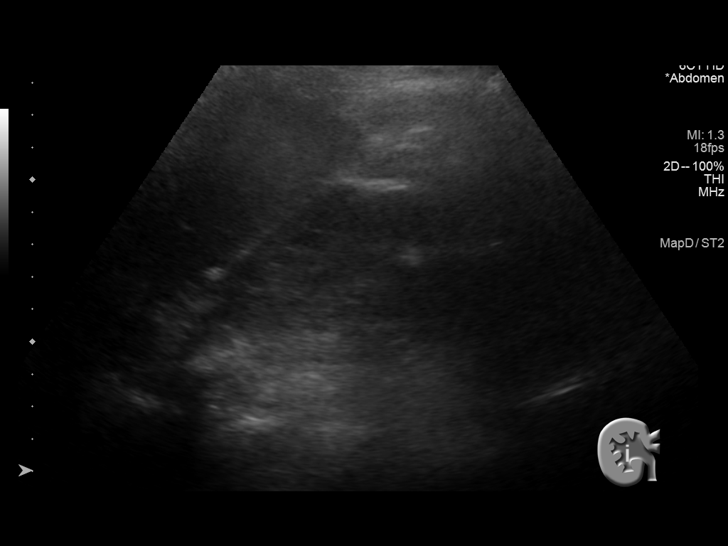
[im 86/104]
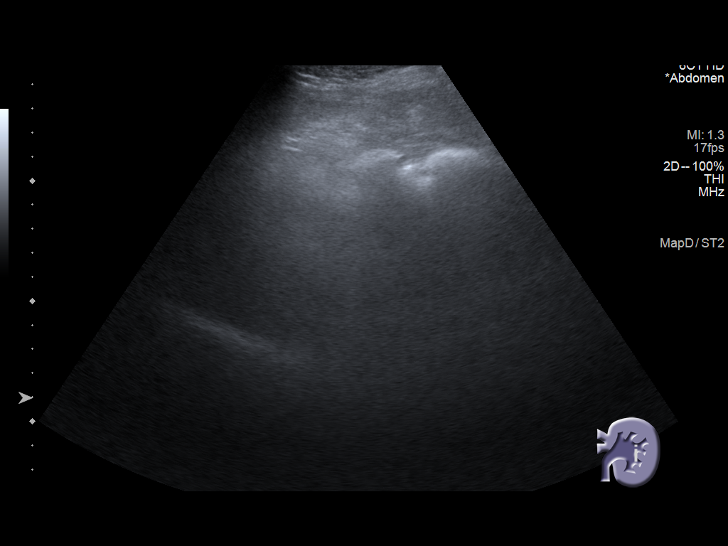
[im 95/104]
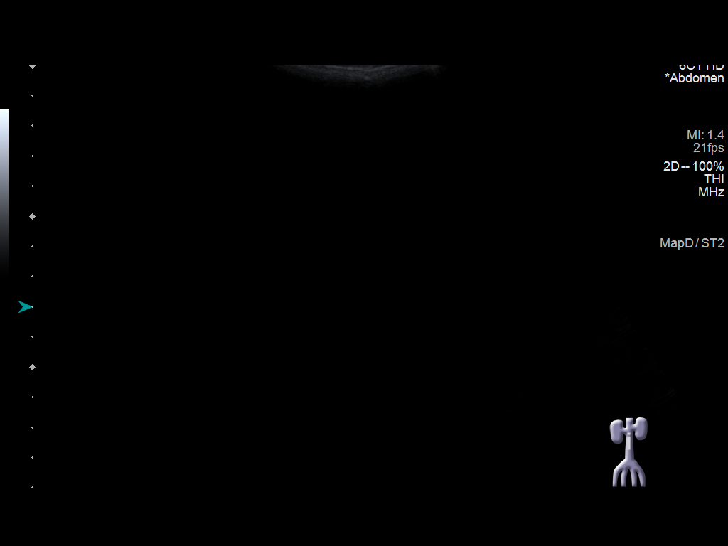
[im 104/104]
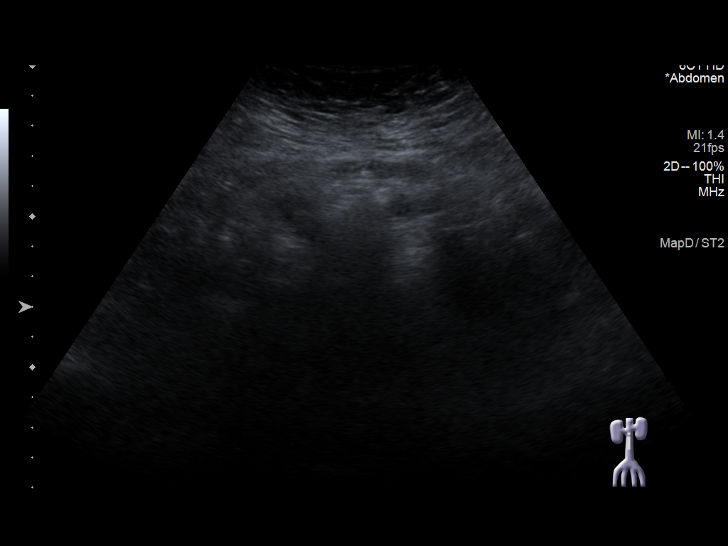

[14 of 25 positions shown; findings below may reference images not displayed]

FINDINGS: Gallbladder: Removed.

Common bile duct: Diameter: 1.1 cm proximally, unchanged.

Liver: No focal lesion identified. Within normal limits in
parenchymal echogenicity. Portal vein is patent on color Doppler
imaging with normal direction of blood flow towards the liver.

IVC: No abnormality visualized.

Pancreas: Not visualized.

Spleen: Size and appearance within normal limits.

Right Kidney: Length: Approximately 10.4 cm. Echogenicity within
normal limits. No mass or hydronephrosis visualized.

Left Kidney: Length: Not visualized.

Abdominal aorta: No aneurysm visualized.

Other findings: None.
IMPRESSION: No acute abnormality or finding to explain the patient's symptoms.
The pancreas and left kidney are not seen due to overlying bowel
gas.

Status post cholecystectomy

## 2021-08-16 IMAGING — MR MR MRCP
6 of 9 series · 26 of 48 positions shown · non-contrast
Comparison: Abdominopelvic CT 08/25/2019 and 12/18/2017. Abdominal
ultrasound 08/27/2019.

CLINICAL DATA: Abdominal pain with elevated liver function studies.
History of cholecystectomy in 9557 and previous gastric bypass.

EXAM:
MRI ABDOMEN WITHOUT CONTRAST  (INCLUDING MRCP)
TECHNIQUE: Multiplanar multisequence MR imaging of the abdomen was performed.
Heavily T2-weighted images of the biliary and pancreatic ducts were
obtained, and three-dimensional MRCP images were rendered by post
processing.

[Series 3: T2 · axial · 5.0mm · 1.09mm/px · z∈[-128,+106]mm · 4 of 40 slices shown]
[im 1/40]
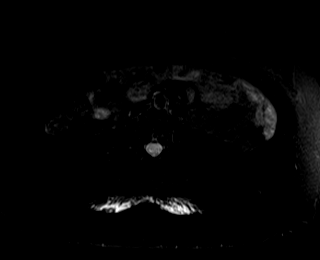
[im 14/40]
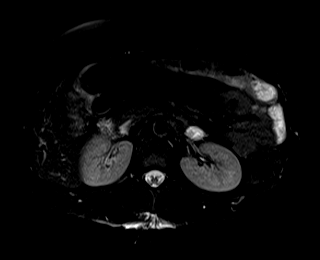
[im 27/40]
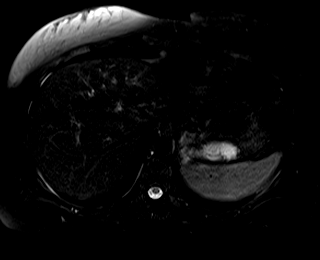
[im 40/40]
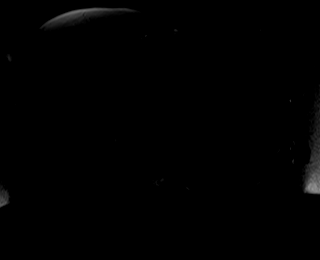

[Series 4: GRE · coronal · 5.0mm · 1.30mm/px · 3 of 36 slices shown]
[im 1/36]
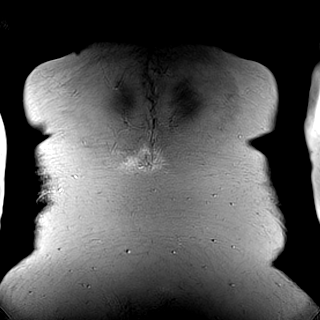
[im 18/36]
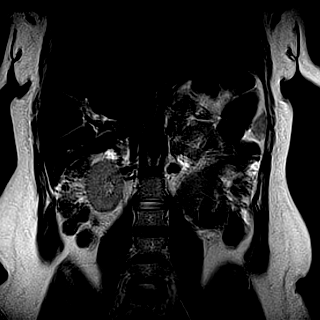
[im 36/36]
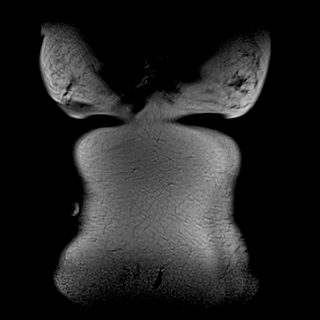

[Series 7: DWI · axial · 5.0mm · 0.99mm/px · z∈[-128,+106]mm · 7 of 80 slices shown]
[im 1/80]
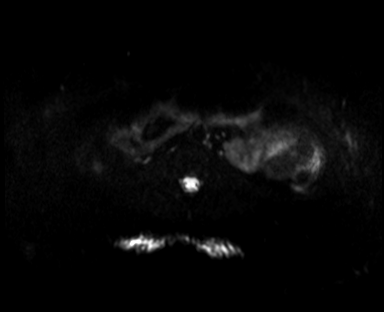
[im 14/80]
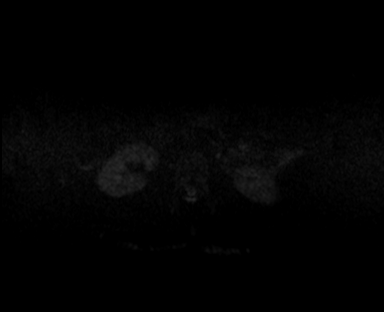
[im 27/80]
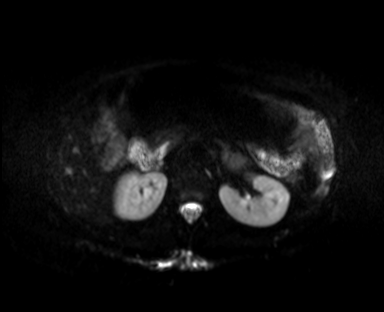
[im 40/80]
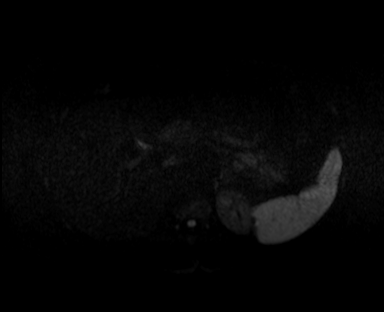
[im 53/80]
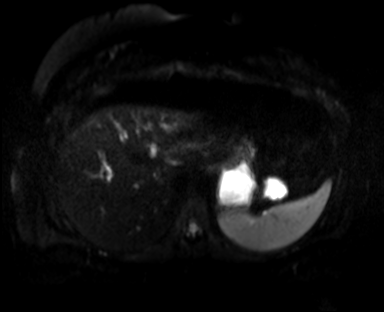
[im 66/80]
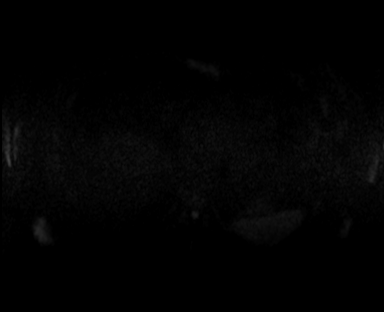
[im 80/80]
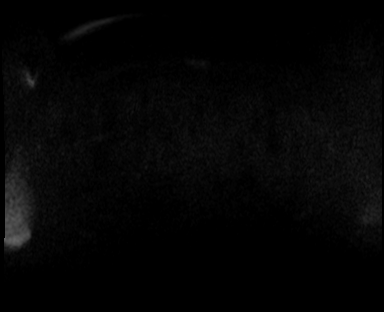

[Series 8: ax dwi_adc · axial · 5.0mm · 0.99mm/px · z∈[-128,+106]mm · 3 of 40 slices shown]
[im 1/40]
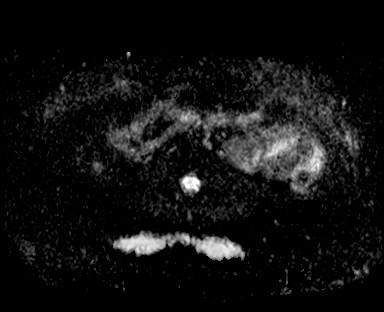
[im 20/40]
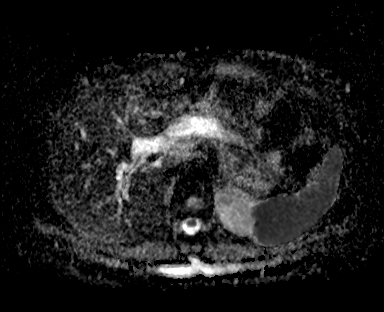
[im 40/40]
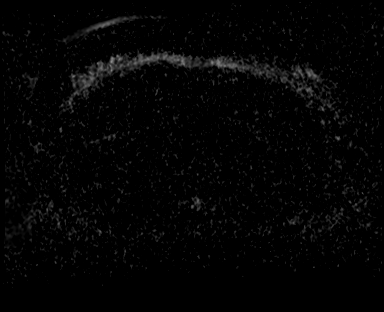

[Series 9: ax dual echo_in · axial · 4.0mm · 0.56mm/px · z∈[-137,+115]mm · 5 of 64 slices shown]
[im 1/64]
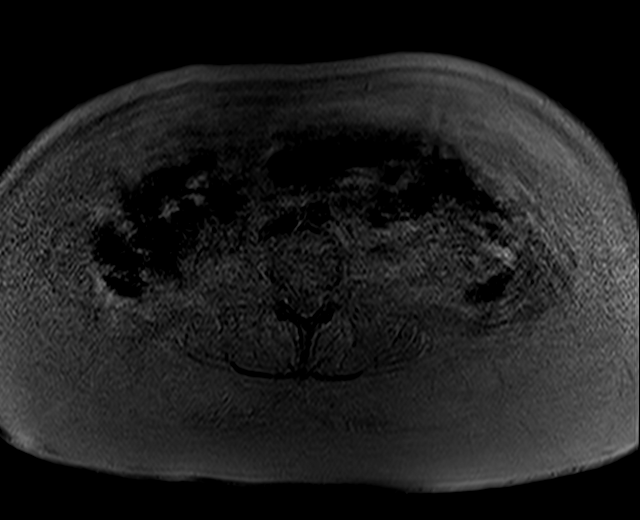
[im 16/64]
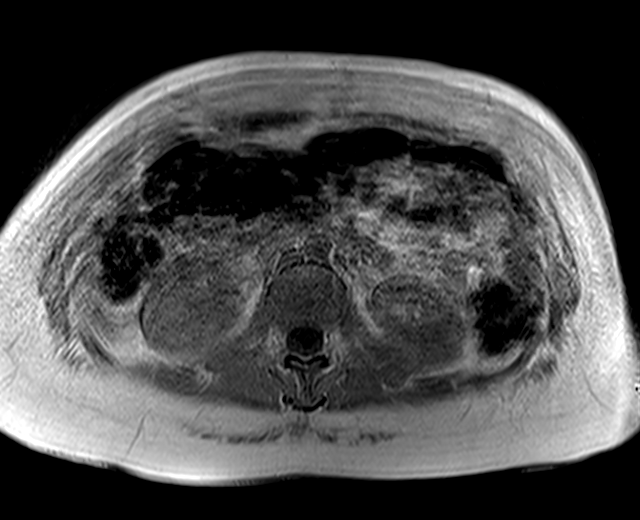
[im 32/64]
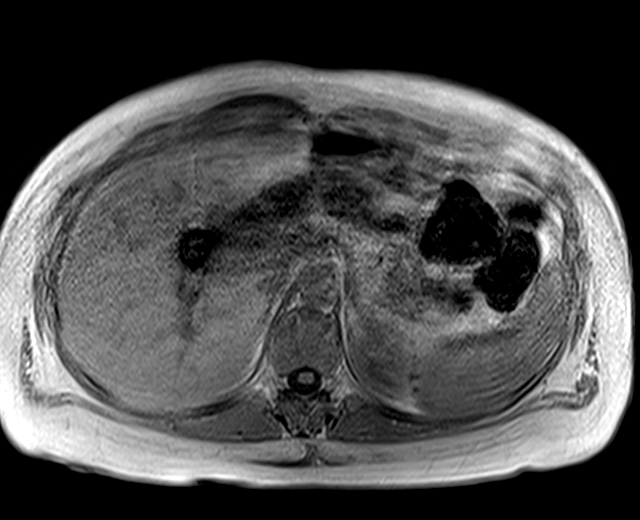
[im 48/64]
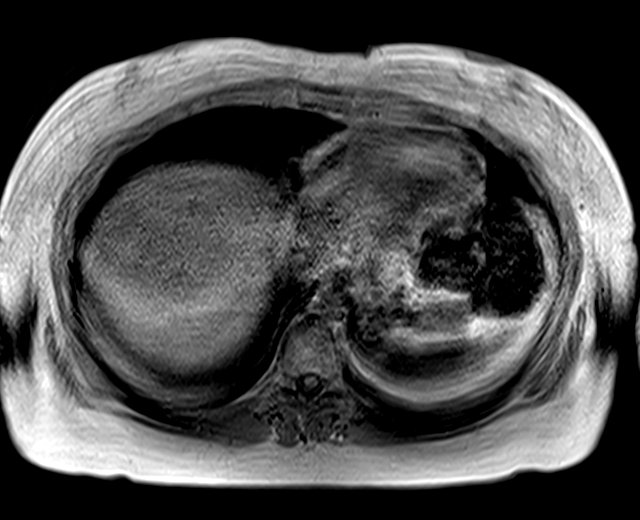
[im 64/64]
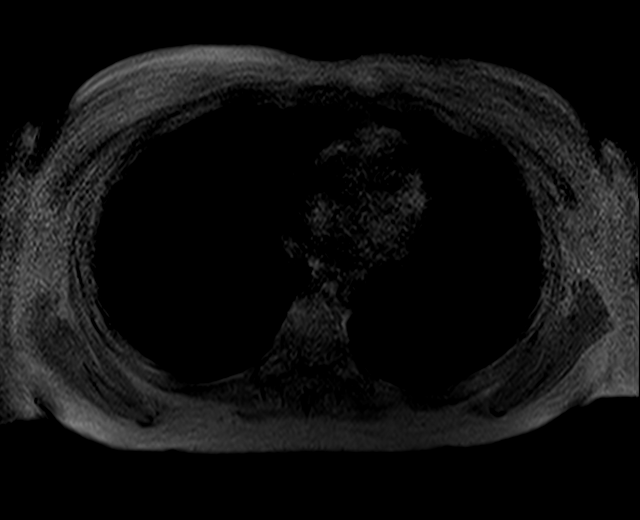

[Series 10: ax dual echo_opp · axial · 4.0mm · 0.56mm/px · z∈[-137,+51]mm · 4 of 64 slices shown]
[im 1/64]
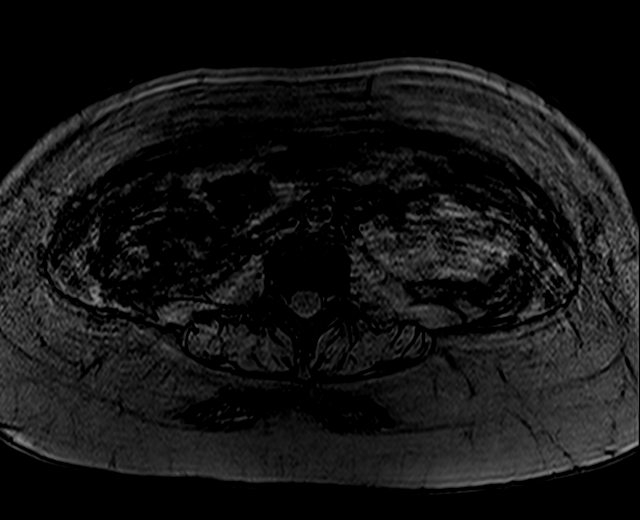
[im 16/64]
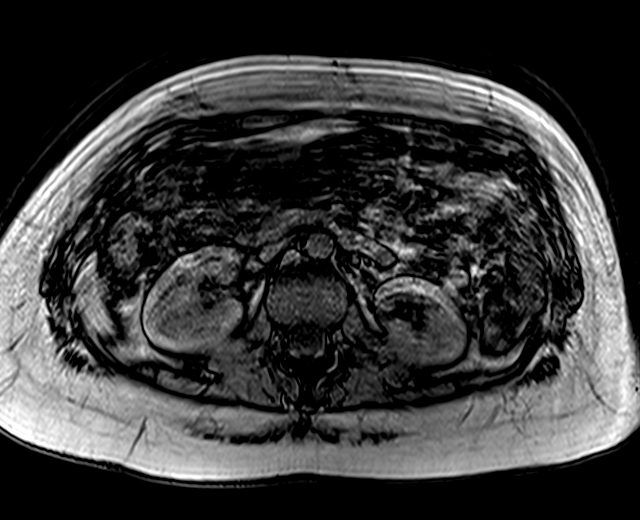
[im 32/64]
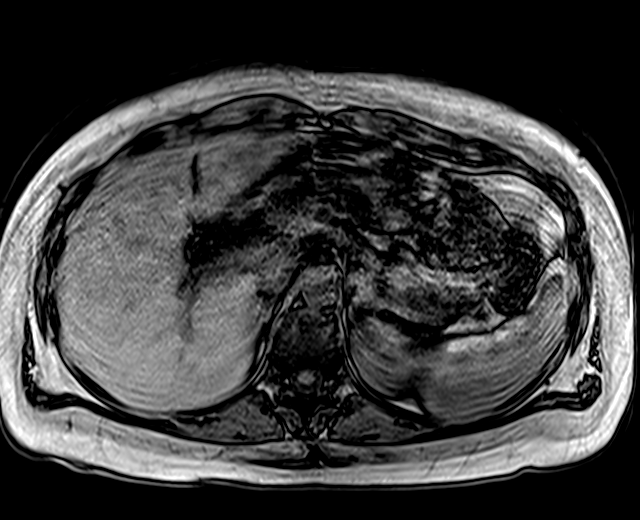
[im 48/64]
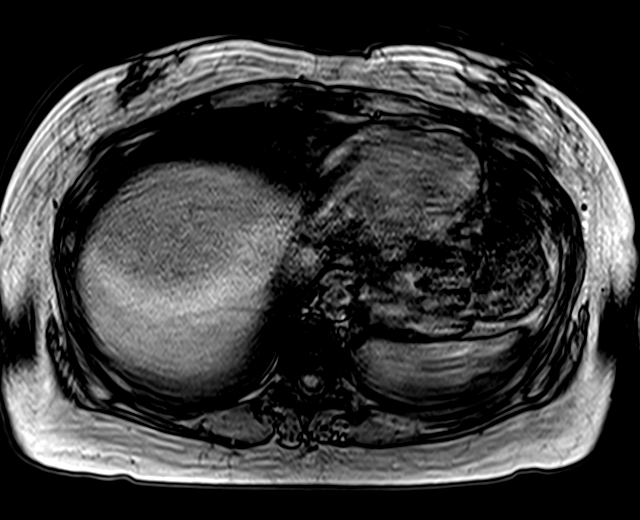

[26 of 48 positions shown; findings below may reference images not displayed]

FINDINGS: Despite efforts by the technologist and patient, mild motion
artifact is present on today's exam and could not be eliminated.
This reduces exam sensitivity and specificity.

Lower chest:  The visualized lower chest appears unremarkable.

Hepatobiliary: The liver appears normal in signal without focal
lesion or significant steatosis. Status post cholecystectomy. There
is mild intra and extrahepatic biliary dilatation, similar to
previous studies. The common hepatic duct measures 11 mm in
diameter. There is a mildly dilated cystic duct remnant. No evidence
of choledocholithiasis.

Pancreas: Unremarkable. No pancreatic ductal dilatation or
surrounding inflammatory changes.

Spleen: Normal in size without focal abnormality.

Adrenals/Urinary Tract: Both adrenal glands appear normal. The
kidneys and ureters appear normal.

Stomach/Bowel: No evidence of bowel wall thickening, distention or
surrounding inflammatory change.Stable postoperative changes in the
left upper quadrant consistent with previous gastric bypass.

Vascular/Lymphatic: There are no enlarged abdominal lymph nodes. No
significant vascular findings are present.

Other: The visualized abdominal wall is intact. No ascites. Mild
dependent subcutaneous edema in the back.

Musculoskeletal: Status post T12 spinal augmentation. No acute
osseous findings.
IMPRESSION: 1. Stable mild biliary dilatation post cholecystectomy, without
evidence of choledocholithiasis, likely physiologic.
2. No acute or focal hepatic abnormalities identified.
3. Previous gastric bypass.

## 2021-08-18 IMAGING — CR DG CHEST 1V PORT
1 series · 1 of 1 positions shown · non-contrast
Comparison: August 25, 2019

CLINICAL DATA: 32-year-old female with a history shortness of
breath

EXAM:
PORTABLE CHEST 1 VIEW

[portable]
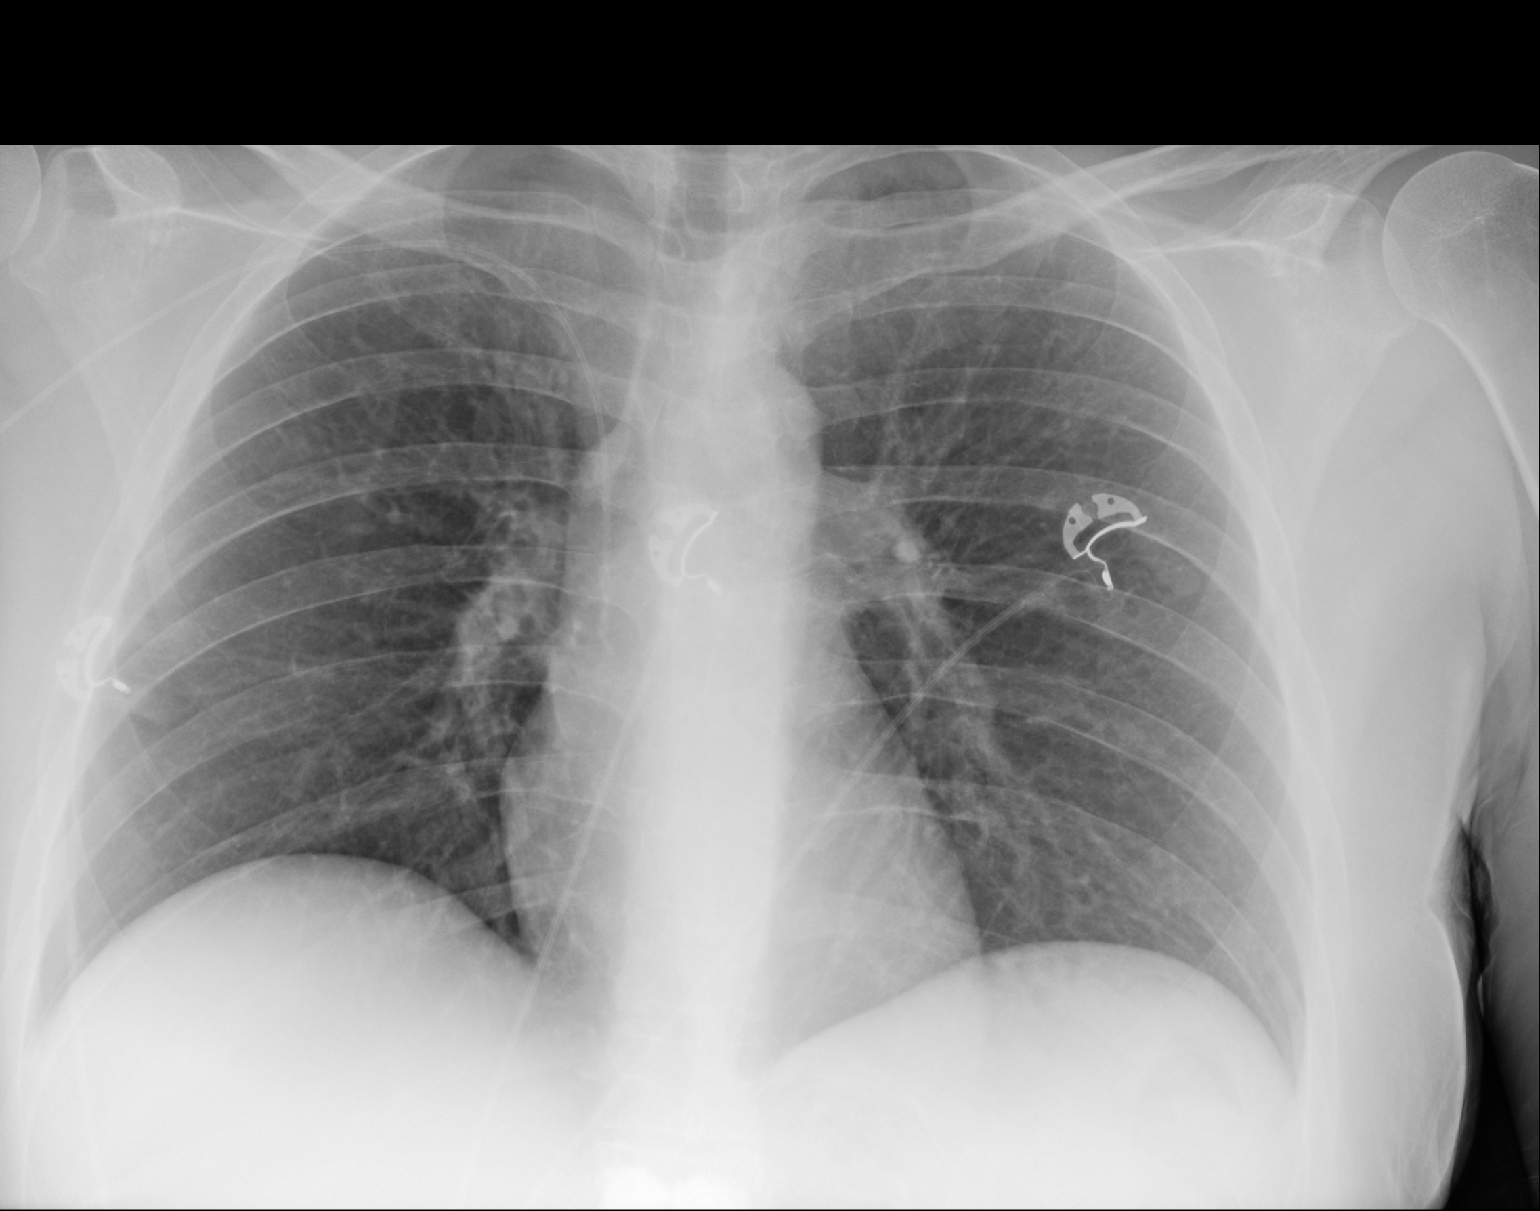

[1 of 1 positions shown; findings below may reference images not displayed]

FINDINGS: Cardiomediastinal silhouette within normal limits in size and
contour.

No evidence of central vascular congestion.

Interval placement right upper extremity PICC with the tip appearing
to terminate superior vena cava.

No confluent airspace disease pneumothorax or pleural effusion.
IMPRESSION: Negative for acute cardiopulmonary disease.

Right upper extremity PICC

## 2021-08-19 DIAGNOSIS — R634 Abnormal weight loss: Secondary | ICD-10-CM | POA: Diagnosis not present

## 2021-08-19 DIAGNOSIS — M8580 Other specified disorders of bone density and structure, unspecified site: Secondary | ICD-10-CM | POA: Diagnosis not present

## 2021-08-19 DIAGNOSIS — E559 Vitamin D deficiency, unspecified: Secondary | ICD-10-CM | POA: Diagnosis not present

## 2021-08-20 ENCOUNTER — Telehealth: Payer: Self-pay | Admitting: *Deleted

## 2021-08-20 ENCOUNTER — Ambulatory Visit (INDEPENDENT_AMBULATORY_CARE_PROVIDER_SITE_OTHER): Payer: Medicaid Other | Admitting: Family Medicine

## 2021-08-20 ENCOUNTER — Encounter: Payer: Self-pay | Admitting: Family Medicine

## 2021-08-20 ENCOUNTER — Other Ambulatory Visit: Payer: Self-pay

## 2021-08-20 DIAGNOSIS — R410 Disorientation, unspecified: Secondary | ICD-10-CM | POA: Diagnosis not present

## 2021-08-20 DIAGNOSIS — R200 Anesthesia of skin: Secondary | ICD-10-CM | POA: Diagnosis not present

## 2021-08-20 NOTE — Progress Notes (Signed)
   Subjective:    Patient ID: Penny [name], female    DOB: 08-Dec-1985, 35 y.o.   MRN: [id number]  HPI  Patient calls to discuss spells she is having. Patient states she had one at her pain management visit this week and when she has the spells she doesn't remember where she is or what she was doing for a while. Patient states she is also suffering with a massive migraine this am.  Virtual Visit via Telephone Note  I connected with Imona Cygan on 08/20/21 at  9:00 AM EDT by telephone and verified that I am speaking with the correct person using two identifiers.  Location: Patient: home Provider: office   I discussed the limitations, risks, security and privacy concerns of performing an evaluation and management service by telephone and the availability of in person appointments. I also discussed with the patient that there may be a patient responsible charge related to this service. The patient expressed understanding and agreed to proceed.   History of Present Illness:    Observations/Objective:   Assessment and Plan:   Follow Up Instructions:    I discussed the assessment and treatment plan with the patient. The patient was provided an opportunity to ask questions and all were answered. The patient agreed with the plan and demonstrated an understanding of the instructions.   The patient was advised to call back or seek an in-person evaluation if the symptoms worsen or if the condition fails to improve as anticipated.  I provided 15 minutes of non-face-to-face time during this encounter.    Review of Systems     Objective:   Physical Exam  Today's visit was via telephone Physical exam was not possible for this visit       Assessment & Plan:  1. Confusion Significant confusion spells the most recent 1 at pain management.  She was recently at the ER had an MRI which overall look good of her brain.  Although there could be some underlying psychiatric issues  triggering these it is also quite possible that there could be medication interactions.  She is on multiple medicines which have neurologic and cognitive effects.  I have encouraged her to talk with her pain management doctor to try to reduce the amount of opioids she is on to lessen her long-term risk.  Also have talked with her about avoiding muscle relaxers such as Flexeril.  She is uncertain if Elavil may be contributing to the spells I encouraged her to talk with Dr. Harrington Challenger regarding that.  We will send communication to their specialists keeping them abreast of the situation.  I have advised the patient not to do any driving  2. Right leg numbness Patient had MRI of the lumbar spine when she was in the ER it did not explain her right leg numbness and potential weakness.  Perhaps neurology can consider whether or not to do nerve conduction studies or EMG studies.  This patient is very nice but has multiple issues going on.  Some of these issues come and go and are very difficult to solve because of multiple competing diagnostic entities

## 2021-08-20 NOTE — Telephone Encounter (Signed)
Ms. randye, hugley are scheduled for a virtual visit with your provider today.    Just as we do with appointments in the office, we must obtain your consent to participate.  Your consent will be active for this visit and any virtual visit you may have with one of our providers in the next 365 days.    If you have a MyChart account, I can also send a copy of this consent to you electronically.  All virtual visits are billed to your insurance company just like a traditional visit in the office.  As this is a virtual visit, video technology does not allow for your provider to perform a traditional examination.  This may limit your provider's ability to fully assess your condition.  If your provider identifies any concerns that need to be evaluated in person or the need to arrange testing such as labs, EKG, etc, we will make arrangements to do so.    Although advances in technology are sophisticated, we cannot ensure that it will always work on either your end or our end.  If the connection with a video visit is poor, we may have to switch to a telephone visit.  With either a video or telephone visit, we are not always able to ensure that we have a secure connection.   I need to obtain your verbal consent now.   Are you willing to proceed with your visit today?   Lexandra Leisure has provided verbal consent on 08/20/2021 for a virtual visit (video or telephone).

## 2021-08-25 ENCOUNTER — Other Ambulatory Visit: Payer: Self-pay

## 2021-08-25 ENCOUNTER — Ambulatory Visit (INDEPENDENT_AMBULATORY_CARE_PROVIDER_SITE_OTHER): Payer: Medicaid Other | Admitting: Adult Health

## 2021-08-25 ENCOUNTER — Encounter: Payer: Self-pay | Admitting: Adult Health

## 2021-08-25 ENCOUNTER — Other Ambulatory Visit (HOSPITAL_COMMUNITY)
Admission: RE | Admit: 2021-08-25 | Discharge: 2021-08-25 | Disposition: A | Discharge status: Home / Self Care | Payer: Medicaid Other | Source: Ambulatory Visit | Attending: Adult Health | Admitting: Adult Health

## 2021-08-25 VITALS — BP 121/88 | HR 109 | Ht 65.0 in | Wt 153.0 lb

## 2021-08-25 DIAGNOSIS — Z803 Family history of malignant neoplasm of breast: Secondary | ICD-10-CM

## 2021-08-25 DIAGNOSIS — Z01419 Encounter for gynecological examination (general) (routine) without abnormal findings: Secondary | ICD-10-CM | POA: Diagnosis not present

## 2021-08-25 DIAGNOSIS — N941 Unspecified dyspareunia: Secondary | ICD-10-CM | POA: Insufficient documentation

## 2021-08-25 DIAGNOSIS — R102 Pelvic and perineal pain: Secondary | ICD-10-CM | POA: Diagnosis not present

## 2021-08-25 DIAGNOSIS — Z1231 Encounter for screening mammogram for malignant neoplasm of breast: Secondary | ICD-10-CM | POA: Insufficient documentation

## 2021-08-25 NOTE — Progress Notes (Addendum)
Patient ID: Penny [name], female   DOB: 1986-05-02, 35 y.o.   MRN: [id number] History of Present Illness: Penny [name] is a 35 year old white female, divorced, [id number], in for a well woman gyn exam and pap. She had gastric by pass years ago. PCP is Dr [name].   Current Medications, Allergies, Past Medical History, Past Surgical History, Family History and Social History were reviewed in [employer] record.     Review of Systems: Patient denies any headaches, hearing loss, fatigue, blurred vision, shortness of breath, chest pain,  problems with bowel movements.  No joint pain or mood swings.  Has pain with sex, pelvic pain/cramping, has brown discharge at times, has UI at times. Has chronic back pain and now feels numb right knee down and it is cool to touch, she feel in 2019 and had fracture and had to have back surgery More recently has had memory loss, has appt Dr Posey Pronto her neurologist in November.   Physical Exam:BP 121/88 (BP Location: Left Arm, Patient Position: Sitting, Cuff Size: Normal)   Pulse (!) 109   Ht 5\' 5"  (1.651 m)   Wt 153 lb (69.4 kg)   BMI 25.46 kg/m   General:  Well developed, thin, no acute distress,using cane today Skin:  Warm and dry Neck:  Midline trachea, normal thyroid, good ROM, no lymphadenopathy Lungs; Clear to auscultation bilaterally Breast:  No dominant palpable mass, retraction, or nipple discharge Cardiovascular: Regular rate and rhythm Abdomen:  Soft, non tender, no hepatosplenomegaly Pelvic:  External genitalia is normal in appearance, no lesions.  The vagina is normal in appearance. Urethra has no lesions or masses. The cervix is smooth, pap with GC/CHL and HR HPV genotyping performed.Marland Kitchen  Uterus is felt to be normal size, shape, and contour.  No adnexal masses or tenderness noted.Bladder is non tender, no masses felt. Rectal: Deferred Extremities/musculoskeletal:  No swelling or varicosities noted, no clubbing or cyanosis Psych:   No mood changes, alert and cooperative,seems happy AA is 1 Fall risk is low Depression screen [facility] 2/9 08/25/2021 01/14/2021 07/14/2020  Decreased Interest 2 3 1   Down, Depressed, Hopeless 1 3 3   PHQ - 2 Score 3 6 4   Altered sleeping 2 1 3   Tired, decreased energy 2 1 3   Change in appetite 2 3 3   Feeling bad or failure about yourself  1 2 3   Trouble concentrating 1 2 3   Moving slowly or fidgety/restless 2 0 0  Suicidal thoughts 0 0 0  PHQ-9 Score 13 15 19   Difficult doing work/chores - Extremely dIfficult Somewhat difficult  Some encounter information is confidential and restricted. Go to Review Flowsheets activity to see all data.  Some recent data might be hidden   On meds and sees Dr Harrington Challenger GAD 7 : Generalized Anxiety Score 08/25/2021  Nervous, Anxious, on Edge 2  Control/stop worrying 2  Worry too much - different things 2  Trouble relaxing 2  Restless 1  Easily annoyed or irritable 2  Afraid - awful might happen 2  Total GAD 7 Score 13  Some encounter information is confidential and restricted. Go to Review Flowsheets activity to see all data.   Marland Kitchen  [employer] - 08/25/21 1139       Pregnancy Intention Screening   Does the patient want to become pregnant in the next year? No    Does the patient's partner want to become pregnant in the next year? No    Would the patient like to discuss contraceptive  options today? No      Contraception Wrap Up   Current Method Female Sterilization    End Method Female Sterilization    Contraception Counseling Provided No            Examination chaperoned by Corporate investment banker.  Impression and Plan: 1. Encounter for gynecological examination with Papanicolaou smear of cervix Pap sent Physical in 1 year  Pap in 3 if normal   2. Dyspareunia in female Scheduled for her 08/31/21 at [facility] at 1:30 am - US PELVIC COMPLETE WITH TRANSVAGINAL; Future  3. Pelvic cramping [name] 08/31/21 - US PELVIC COMPLETE WITH TRANSVAGINAL; Future  4. Screening  mammogram for breast cancer Mammogram scheduled 08/31/21 at 1 pm at [facility]; Future  5. Family history of breast cancer in mother Will get mammogram 08/31/21, mom had breast cancer early 20's and maternal grand ma had breast cancer  - MM 3D SCREEN BREAST BILATERAL; Future

## 2021-08-25 NOTE — Addendum Note (Signed)
Addended by: Octaviano Glow on: 08/25/2021 01:52 PM   Modules accepted: Orders

## 2021-08-26 ENCOUNTER — Ambulatory Visit (INDEPENDENT_AMBULATORY_CARE_PROVIDER_SITE_OTHER): Payer: Medicaid Other | Admitting: Clinical

## 2021-08-26 DIAGNOSIS — F411 Generalized anxiety disorder: Secondary | ICD-10-CM | POA: Diagnosis not present

## 2021-08-26 DIAGNOSIS — F332 Major depressive disorder, recurrent severe without psychotic features: Secondary | ICD-10-CM | POA: Diagnosis not present

## 2021-08-26 NOTE — Progress Notes (Signed)
Virtual Visit via Telephone Note  I connected with Penny [name] on 08/26/21 at  9:00 AM EDT by telephone and verified that I am speaking with the correct person using two identifiers.  Location: Patient: Home Provider: Office   I discussed the limitations, risks, security and privacy concerns of performing an evaluation and management service by telephone and the availability of in person appointments. I also discussed with the patient that there may be a patient responsible charge related to this service. The patient expressed understanding and agreed to proceed.   THERAPIST PROGRESS NOTE   Session Time: 9:00AM-9:55AM   Participation Level: Active   Behavioral Response: CasualAlertDepressed and NA   Type of Therapy: Individual Therapy   Treatment Goals addressed: Coping   Interventions: CBT, Motivational Interviewing, Strength-based and Supportive   Summary: Penny [name] is a 35 y.o. female who presents with GAD and MDD. The OPT therapist worked with the patient for her ongoing OPT treatment session. The OPT therapist utilized Motivational Interviewing to assist in creating therapeutic repore. The patient in the session was engaged and work in collaboration giving feedback about her triggers and symptoms over the past few weeks. The patient spoke about her experience during her beach trip and spending time with them, however, noted ongoing difficulty with her physical health. The OPT therapist worked with the patient implementing elements of CBT. The OPT therapist provided psycho-education during the session. The OPT therapist worked with the patient on using her supports and communicating her feelings and focusing in areas that she has control in. The OPT therapist reviewed with the patient her upcoming health appointments including Psychiatrist, Gastrologist, and Neurologist. The patient spoke about her schedule in getting her kids versus when they are with there Father and the impact of  the current custody schedule.  Suicidal/Homicidal: Nowithout intent/plan   Therapist Response: The OPT therapist worked with the patient for the patients scheduled session. The patient was engaged in her session and gave feedback in relation to triggers, symptoms, and behavior responses over the past few weeks. The patient has been having difficulty managing symptoms of her GAD and MDD . The OPT therapist in session worked with the patient and utilized elements of CBT in working with the patient to help empower the patient to set healthy boundaries and put her effort into areas she has control to make decisions about. The OPT therapist worked with the patient on, positive thinking, communication of her feelings and not allowing automatic negative thoughts dominate, and use her coping strategies. . The patient spoke about her experience with her recent beach trip as well as the impact of her health condition on her mobility and her goal of spending more time and being more involved with her children. The patient notes, " Its much more difficult when I am using a wheelchair or walking with a cain". I am more limited and I feel like my kids are embarrassed by being with me when I have to use a wheel chair or cain. The OPT therapist worked with the patient to not take on guilt for her healthcondition.The OPT therapist will continue to work with the patient for her ongoing OPT treatment.   Plan: Return again in 2/3 weeks.   Diagnosis:      Axis I: Depression and GAD                           Axis II: No diagnosis   I discussed the  assessment and treatment plan with the patient. The patient was provided an opportunity to ask questions and all were answered. The patient agreed with the plan and demonstrated an understanding of the instructions.   The patient was advised to call back or seek an in-person evaluation if the symptoms worsen or if the condition fails to improve as anticipated.   I provided 55  minutes of non-face-to-face time during this encounter.   Lennox Grumbles, LCSW   08/26/2021

## 2021-08-27 LAB — CYTOLOGY - PAP
Chlamydia: NEGATIVE
Comment: NEGATIVE
Comment: NEGATIVE
Comment: NORMAL
Diagnosis: NEGATIVE
High risk HPV: NEGATIVE
Neisseria Gonorrhea: NEGATIVE

## 2021-08-28 ENCOUNTER — Encounter: Payer: Self-pay | Admitting: Neurology

## 2021-08-28 ENCOUNTER — Ambulatory Visit (HOSPITAL_COMMUNITY): Payer: Medicaid Other

## 2021-08-28 ENCOUNTER — Other Ambulatory Visit: Payer: Self-pay

## 2021-08-28 ENCOUNTER — Ambulatory Visit: Payer: Medicaid Other | Admitting: Neurology

## 2021-08-28 VITALS — BP 120/85 | HR 84 | Ht 65.0 in | Wt 157.0 lb

## 2021-08-28 DIAGNOSIS — G43709 Chronic migraine without aura, not intractable, without status migrainosus: Secondary | ICD-10-CM

## 2021-08-28 DIAGNOSIS — M79604 Pain in right leg: Secondary | ICD-10-CM | POA: Diagnosis not present

## 2021-08-28 DIAGNOSIS — G608 Other hereditary and idiopathic neuropathies: Secondary | ICD-10-CM | POA: Diagnosis not present

## 2021-08-28 MED ORDER — AIMOVIG 140 MG/ML ~~LOC~~ SOAJ: 140.0000 mg | SUBCUTANEOUS | 11 refills | Status: DC

## 2021-08-28 NOTE — Progress Notes (Signed)
Follow-up Visit   Date: 08/28/21   Penny [name] MRN: [id number] DOB: 1986/10/26   Interim History: Penny [name] is a 35 y.o. 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   She woke up on 8/16 with numbness in the right leg from her knee down in to the foot.  It is no clear whether she has weakness, she does not recall dragging the leg, but her mother says that she got her a wheelchair.  Over the past few weeks, her numbness has improved and involved from her mid-calf down into her foot, numbness involving the foot.  She does not have similar symptoms on the left leg. She also complains that her right leg always feels cool as compared to the left.   Her headaches are getting more frequent and she no longer is experiencing benefit with Aimovig 70mg  monthly.   She also complains of ongoing confusional spells. Last week, she was driving to her pain management and called her mom confused with slurred speech.  In the past, her confusional spells have occurred in the setting of stress/anxiety. MRI brain x 2 this year has been normal.  There has been concern whether this was due to medication interactions.    Medications:  Current Outpatient Medications on File Prior to Visit  Medication Sig Dispense Refill   AIMOVIG 70 MG/ML SOAJ SMARTSIG:70 Milligram(s) SUB-Q Every 4 Weeks     amphetamine-dextroamphetamine (ADDERALL XR) 30 MG 24 hr capsule Take 1 capsule (30 mg total) by mouth every morning. 30 capsule 0   Calcium Carb-Cholecalciferol (CALCIUM-VITAMIN D3) 600-500 MG-UNIT CAPS Take 1 tablet by mouth daily.     Carboxymethylcellul-Glycerin (LUBRICATING EYE DROPS OP) Place 1 drop into both eyes daily as needed (dry eyes).     cetirizine (ZYRTEC) 10 MG tablet TAKE ONE TABLET BY  MOUTH DAILY (Patient taking differently: Take 10 mg by mouth daily.) 30 tablet 5   clidinium-chlordiazePOXIDE (LIBRAX) 5-2.5 MG capsule Take 1 capsule by mouth 3 (three) times daily before meals. 90 capsule 5   clonazePAM (KLONOPIN) 0.5 MG tablet Take 1 tablet (0.5 mg total) by mouth 2 (two) times daily as needed. 60 tablet 2   DULoxetine (CYMBALTA) 60 MG capsule Take 1 capsule (60 mg total) by mouth 2 (two) times daily. 180 capsule 2   fluticasone (FLOVENT HFA) 220 MCG/ACT inhaler Inhale 2 puffs into the lungs 2 (two) times daily. Do not inhale, swallow powder and drink water , do not eat food for 20 minutes after puffs 4 each 2   HYDROmorphone (DILAUDID) 4 MG tablet Take 4 mg by mouth every 4 (four) hours as needed for severe pain.     lansoprazole (PREVACID) 30 MG capsule Take 1 capsule (30 mg total) by mouth daily. 180 capsule 1   levalbuterol (XOPENEX HFA) 45 MCG/ACT inhaler INHALE (2) PUFFS INTO THE LUNGS EVERY SIX HOURS AS NEEDED FOR WHEEZING. (Patient taking differently: Inhale 2 puffs into the lungs every 6 (six) hours as needed for wheezing or shortness of breath. INHALE (2) PUFFS INTO THE LUNGS EVERY SIX HOURS AS NEEDED FOR WHEEZING.) 15 g 3   levalbuterol (XOPENEX) 1.25 MG/3ML nebulizer solution Take 1.25 mg by nebulization every 4 (four) hours as needed for wheezing. 50 mL 3   linaclotide (LINZESS) 290 MCG CAPS capsule Take 1 capsule (290 mcg total) by mouth  daily before breakfast. 90 capsule 3   meclizine (ANTIVERT) 25 MG tablet Take 1 tablet (25 mg total) by mouth 3 (three) times daily as needed for dizziness. 30 tablet 0   pantoprazole (PROTONIX) 40 MG tablet TAKE 1 TABLET BY MOUTH TWICE DAILY BEFORE MEALS. 60 tablet 2   Pediatric Multivit-Minerals-C (FLINTSTONES GUMMIES) chewable tablet Chew 3 tablets by mouth daily.     promethazine (PHENERGAN) 12.5 MG tablet TAKE 1 TABLET BY MOUTH EVERY 8 HOURS AS NEEDED FOR NAUSEA OR VOMITING 30 tablet 0   promethazine (PHENERGAN) 25 MG tablet Take  25 mg by mouth 2 (two) times daily as needed for nausea or vomiting.     tiZANidine (ZANAFLEX) 4 MG tablet Take 4 mg by mouth 3 (three) times daily as needed.     Vitamin D, Ergocalciferol, (DRISDOL) 1.25 MG (50000 UNIT) CAPS capsule Take 50,000 Units by mouth every Wednesday.     No current facility-administered medications on file prior to visit.    Allergies:  Allergies  Allergen Reactions   Gabapentin Other (See Comments)    Pt states that she was unresponsive after taking this medication, but her vitals remained stable.     Metoclopramide Hcl Anxiety and Other (See Comments)    Pt states that she felt like she was trapped in a box, and could not get out.  Pt also states that she had temporary loss of movement, weakness, and tingling.     Tramadol Other (See Comments)    Seizures   Ativan [Lorazepam] Other (See Comments)    combative   Latex Itching, Rash and Other (See Comments)    Burns   Lyrica [Pregabalin] Other (See Comments)    suicidal   Iron Other (See Comments)    PATIENT IS INTOLERANT TO ORAL IRON , SHE DOES NOT ABSORB IT.     Sulfa Antibiotics Hives   Butrans [Buprenorphine] Rash    Patch caused rash, didn't help with pain   Nucynta [Tapentadol] Nausea And Vomiting   Propofol Nausea And Vomiting   Tape Hives, Itching and Rash    Please use paper tape   Toradol [Ketorolac Tromethamine] Anxiety    Vital Signs:  BP 120/85   Pulse 84   Ht 5\' 5"  (1.651 m)   Wt 157 lb (71.2 kg)   SpO2 99%   BMI 26.13 kg/m   Neurological Exam: MENTAL STATUS including orientation to time, place, person, recent and remote memory, attention span and concentration, language, and fund of knowledge is normal.  Speech is not dysarthric.  CRANIAL NERVES:  No visual field defects.  Pupils equal round and reactive to light.  Normal conjugate, extra-ocular eye movements in all directions of gaze.  No ptosis.  Face is symmetric. Palate elevates symmetrically.  Tongue is midline.  MOTOR:   Motor strength is 5/5 in the arms and proximally in the legs.  There is give-way weakness with strength testing of the right foot, but with repeated testing all movements including dorsiflexion, inversion, eversion, and plantarflexion is 5/5. No atrophy, fasciculations or abnormal movements.  No pronator drift.  Tone is normal.    MSRs:  Reflexes are 2+/4 throughout.  SENSORY:  Vibration, temperature, and pin prick is absent in the right foot, intact on the left.  COORDINATION/GAIT:  Normal finger-to- nose-finger.  Gait is antalgic, slow, assisted with a cane.   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 Mild maxillary sinus disease Otherwise unremarkable MRI brain.  MRI  brain wo contrast 07/29/2021: No acute intracranial abnormality. Stable and normal noncontrast MRI appearance of the brain.  MRI lumbar spine wo contrast 07/29/2021: 1. No acute osseous injury of the lumbar spine. 2. 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 3. No evidence of cord compression.  IMPRESSION/PLAN: Right leg numbness, circumfrential involving the lower leg and foot.  Numbness does not follow a nerve pattern.  Initially I was concerned about peroneal neuropathy especially with history of HNPP, but the distribution of numbness does not fit.  -  NCS/EMG of the legs.  She has not tolerated this in the past, but was agreeable to proceed with testing  -  Vascular studies of the legs due to asymmetric right leg temperature  -  Start physical therapy   Chronic migraine, worsening  - Previous medications:  Topiramate,cymbalta (she takes for depression/pain), nortriptyline (pain)  - Increase Aimovig to 140mg  every 28 days  3.   Confusional spells, behavioral vs medication effect.  No findings to suggest primary neurological etiology.  She has history of pseudoseizures and it is possible her spells last week with slurred speech may be related to this.  4.  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  Further recommendations pending results.  Total time spent reviewing records, interview, history/exam, documentation, counseling, and coordination of care on day of encounter:  40 min    Thank you for allowing me to participate in patient's care.  If I can answer any additional questions, I would be pleased to do so.    Sincerely,    Shaelee Forni K. Allena Katz, DO

## 2021-08-28 NOTE — Patient Instructions (Signed)
We will refer your for arterial studies of the legs  Nerve testing of the right leg  Out-patient physical therapy at [facility]

## 2021-08-31 ENCOUNTER — Other Ambulatory Visit: Payer: Self-pay

## 2021-08-31 ENCOUNTER — Ambulatory Visit (HOSPITAL_COMMUNITY)
Admission: RE | Admit: 2021-08-31 | Discharge: 2021-08-31 | Disposition: A | Discharge status: Home / Self Care | Payer: Medicaid Other | Source: Ambulatory Visit | Attending: Adult Health | Admitting: Adult Health

## 2021-08-31 ENCOUNTER — Emergency Department (HOSPITAL_COMMUNITY)
Admission: EM | Admit: 2021-08-31 | Discharge: 2021-08-31 | Disposition: A | Discharge status: Home / Self Care | Payer: Medicaid Other | Attending: Emergency Medicine | Admitting: Emergency Medicine

## 2021-08-31 ENCOUNTER — Other Ambulatory Visit: Payer: Self-pay | Admitting: Adult Health

## 2021-08-31 ENCOUNTER — Encounter (HOSPITAL_COMMUNITY): Payer: Self-pay

## 2021-08-31 DIAGNOSIS — Z87891 Personal history of nicotine dependence: Secondary | ICD-10-CM | POA: Diagnosis not present

## 2021-08-31 DIAGNOSIS — Z91040 Latex allergy status: Secondary | ICD-10-CM | POA: Insufficient documentation

## 2021-08-31 DIAGNOSIS — R251 Tremor, unspecified: Secondary | ICD-10-CM | POA: Diagnosis present

## 2021-08-31 DIAGNOSIS — J45909 Unspecified asthma, uncomplicated: Secondary | ICD-10-CM | POA: Diagnosis not present

## 2021-08-31 DIAGNOSIS — G43909 Migraine, unspecified, not intractable, without status migrainosus: Secondary | ICD-10-CM | POA: Insufficient documentation

## 2021-08-31 DIAGNOSIS — R300 Dysuria: Secondary | ICD-10-CM | POA: Insufficient documentation

## 2021-08-31 DIAGNOSIS — N941 Unspecified dyspareunia: Secondary | ICD-10-CM

## 2021-08-31 DIAGNOSIS — R102 Pelvic and perineal pain: Secondary | ICD-10-CM

## 2021-08-31 DIAGNOSIS — I10 Essential (primary) hypertension: Secondary | ICD-10-CM | POA: Diagnosis not present

## 2021-08-31 LAB — URINALYSIS, ROUTINE W REFLEX MICROSCOPIC
Bacteria, UA: NONE SEEN
Bilirubin Urine: NEGATIVE
Glucose, UA: NEGATIVE mg/dL
Hgb urine dipstick: NEGATIVE
Ketones, ur: NEGATIVE mg/dL
Nitrite: NEGATIVE
Protein, ur: NEGATIVE mg/dL
Specific Gravity, Urine: 1.013 (ref 1.005–1.030)
pH: 5 (ref 5.0–8.0)

## 2021-08-31 LAB — CBC WITH DIFFERENTIAL/PLATELET
Abs Immature Granulocytes: 0.01 10*3/uL (ref 0.00–0.07)
Basophils Absolute: 0.1 10*3/uL (ref 0.0–0.1)
Basophils Relative: 1 %
Eosinophils Absolute: 0.6 10*3/uL — ABNORMAL HIGH (ref 0.0–0.5)
Eosinophils Relative: 7 %
HCT: 43.1 % (ref 36.0–46.0)
Hemoglobin: 14.4 g/dL (ref 12.0–15.0)
Immature Granulocytes: 0 %
Lymphocytes Relative: 31 %
Lymphs Abs: 2.8 10*3/uL (ref 0.7–4.0)
MCH: 32.7 pg (ref 26.0–34.0)
MCHC: 33.4 g/dL (ref 30.0–36.0)
MCV: 97.7 fL (ref 80.0–100.0)
Monocytes Absolute: 0.6 10*3/uL (ref 0.1–1.0)
Monocytes Relative: 7 %
Neutro Abs: 5 10*3/uL (ref 1.7–7.7)
Neutrophils Relative %: 54 %
Platelets: 288 10*3/uL (ref 150–400)
RBC: 4.41 MIL/uL (ref 3.87–5.11)
RDW: 12.9 % (ref 11.5–15.5)
WBC: 9.1 10*3/uL (ref 4.0–10.5)
nRBC: 0 % (ref 0.0–0.2)

## 2021-08-31 LAB — RAPID URINE DRUG SCREEN, HOSP PERFORMED
Amphetamines: POSITIVE — AB
Barbiturates: NOT DETECTED
Benzodiazepines: POSITIVE — AB
Cocaine: NOT DETECTED
Opiates: POSITIVE — AB
Tetrahydrocannabinol: NOT DETECTED

## 2021-08-31 LAB — BASIC METABOLIC PANEL
Anion gap: 6 (ref 5–15)
BUN: 11 mg/dL (ref 6–20)
CO2: 30 mmol/L (ref 22–32)
Calcium: 9.1 mg/dL (ref 8.9–10.3)
Chloride: 101 mmol/L (ref 98–111)
Creatinine, Ser: 0.61 mg/dL (ref 0.44–1.00)
GFR, Estimated: >60 mL/min (ref >60)
Glucose, Bld: 65 mg/dL — ABNORMAL LOW (ref 70–99)
Potassium: 4 mmol/L (ref 3.5–5.1)
Sodium: 137 mmol/L (ref 135–145)

## 2021-08-31 LAB — PREGNANCY, URINE: Preg Test, Ur: NEGATIVE

## 2021-08-31 LAB — SALICYLATE LEVEL: Salicylate Lvl: <7 mg/dL — ABNORMAL LOW (ref 7.0–30.0)

## 2021-08-31 LAB — ETHANOL: Alcohol, Ethyl (B): <10 mg/dL (ref <10)

## 2021-08-31 LAB — ACETAMINOPHEN LEVEL: Acetaminophen (Tylenol), Serum: <10 ug/mL — ABNORMAL LOW (ref 10–30)

## 2021-08-31 MED ORDER — ONDANSETRON 8 MG PO TBDP
8.0000 mg | ORAL_TABLET | Freq: Once | ORAL | Status: AC
  Administered 2021-08-31: 8 mg via ORAL
  Filled 2021-08-31: qty 1

## 2021-08-31 MED ORDER — HYDROMORPHONE HCL 1 MG/ML IJ SOLN
0.5000 mg | Freq: Once | INTRAMUSCULAR | Status: AC
  Administered 2021-08-31: 0.5 mg via INTRAVENOUS
  Filled 2021-08-31: qty 1

## 2021-08-31 MED ORDER — HYDROMORPHONE HCL 1 MG/ML IJ SOLN
1.0000 mg | Freq: Once | INTRAMUSCULAR | Status: AC
Start: 2021-08-31 — End: 2021-08-31
  Administered 2021-08-31: 1 mg via INTRAMUSCULAR
  Filled 2021-08-31: qty 1

## 2021-08-31 MED ORDER — SODIUM CHLORIDE 0.9 % IV SOLN
1.0000 g | Freq: Once | INTRAVENOUS | Status: AC
  Administered 2021-08-31: 1 g via INTRAVENOUS
  Filled 2021-08-31: qty 10

## 2021-08-31 MED ORDER — METRONIDAZOLE 0.75 % VA GEL: 1.0000 | Freq: Every day | VAGINAL | 0 refills | Status: DC

## 2021-08-31 MED ORDER — DIPHENHYDRAMINE HCL 25 MG PO CAPS
25.0000 mg | ORAL_CAPSULE | Freq: Once | ORAL | Status: AC
  Administered 2021-08-31: 25 mg via ORAL
  Filled 2021-08-31: qty 1

## 2021-08-31 NOTE — ED Triage Notes (Signed)
Pt brought to ED via wheelchair from radiology. Pt was suppose to have a non-emergent pelvic ultrasound today and started shaking and sweating per staff. Pt will respond and look at you and carry on conversation during her shaking. Pt states she is hurting in her back and her legs but it has been there.

## 2021-08-31 NOTE — ED Provider Notes (Signed)
[facility] EMERGENCY DEPARTMENT Provider Note   CSN: [id number] Arrival date & time: 08/31/21  1211     History No chief complaint on file.   Penny [name] is a 35 y.o. female.  HPI     Penny [name] is a 35 y.o. female with past medical history of anemia, anxiety, migraine headaches, fibromyalgia, and psychiatric pseudoseizures presents to the Emergency Department complaining of generalized tremors and sweating.  Patient was at the hospital for scheduled outpatient mammogram and pelvic ultrasound and begin having generalized shaking and sweating per radiology staff.  Patient states her symptoms began on the day prior to arrival, but became worse just prior to having her ultrasound.  She states her shaking has been associated with a left-sided headache that is similar to her previous migraines.  She also complains of pain of her lower back that radiates into her right leg.  She endorses having numbness from mid calf down to her right foot.  This has been going on for some time.  She has been evaluated here for this with MRI.  She states she is also recently been seen at [facility] by an endocrinologist and found out that her cortisol levels are low.  She denies head injury, visual changes, dizziness, syncope, fever, chills, dysuria and chest pain.  She also denies any recent alcohol or illicit drug use.  Past Medical History:  Diagnosis Date   ADD (attention deficit disorder)    Anemia 2011   2o to GASTRIC BYPASS   Anginal pain ([place])    Anxiety    Asthma    Blood transfusion without reported diagnosis    Chronic daily headache    Depression    Depression    Dysmenorrhea 09/18/2013   Dysrhythmia    Elevated liver enzymes JUL 2011ALK PHOS 111-127 AST  143-267 ALT  213-321T BILI 0.6  ALB  3.7-4.06 Jun 2011 ALK PHOS 118 AST 24 ALT 42 T BILI 0.4 ALB 3.9   Encounter for drug rehabilitation    at behavioral health for opioid addiction about 4 years ago   Family history of adverse  reaction to anesthesia    'dad had to be kept on pump for breathing for morphine'   Fatty liver    Fibroid 01/18/2017   Fibromyalgia    Gastric bypass status for obesity    Gastritis JULY 2011   Heart murmur    Hereditary and idiopathic peripheral neuropathy 01/15/2015   History of Holter monitoring    Hx of opioid abuse ([place])    for about 4 years, about 4 years ago   Interstitial cystitis    Iron deficiency anemia 07/23/2010   Irritable bowel syndrome 2012 DIARRHEA   JUN 2012 TTG IgA 14.9   IUD FEB 2010   Lupus ([place])    Menorrhagia 07/18/2013   Migraines    Obesity (BMI 30-39.9) 2011 228 LBS BMI 36.8   Osteoporosis    Ovarian cyst    Patient desires pregnancy 09/18/2013   Polyneuropathy    PONV (postoperative nausea and vomiting) seizure post-operatively   seizure following ankle surgery   Potassium (K) deficiency    Pregnant 12/25/2013   Psychiatric pseudoseizure    RLQ abdominal pain 07/18/2013   Sciatica of left side 11/14/2014   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   Stress 09/18/2013    Patient Active Problem List   Diagnosis Date Noted   Dyspareunia in female 08/25/2021   Encounter for gynecological examination with Papanicolaou smear  of cervix 08/25/2021   Pelvic cramping 08/25/2021   Screening mammogram for breast cancer 08/25/2021   Chronic prescription opiate use 12/25/2020   Sphincter of Oddi dysfunction 12/25/2020   Constipation 12/25/2020   Major depressive disorder, recurrent episode ([place]) 06/22/2020   Major depression 06/19/2020   Dehydration    Chronic abdominal pain 09/19/2019   Gastroesophageal reflux disease without esophagitis    Cannabis abuse 08/30/2019   Dyspnea    Severe anxiety 08/21/2019   Absolute anemia 04/23/2019   Urge incontinence 10/26/2018   S/P kyphoplasty 08/02/2018   Herpes genitalis in women 12/19/2017   Cannabis use disorder, moderate, dependence ([place]) 07/07/2017   Abnormal urine odor 03/30/2017   Urinary tract infection  with hematuria 03/30/2017   Screening for genitourinary condition 03/30/2017   Pain 03/30/2017   Burning with urination 03/30/2017   Family history of breast cancer in mother,uncertain BR CA status 02/28/2017   Fibroid 01/18/2017   Chronic pain syndrome 12/05/2016   Folate deficiency 12/04/2016   Anastomotic ulcer 04/22/2016   Pain medication agreement signed 04/22/2016   Complaints of leg weakness    Paresthesia    Left-sided weakness    Uncontrolled pain 01/24/2016   Malnutrition of moderate degree 01/24/2016   Neuropathy 01/23/2016   Inability to walk 01/23/2016   Paresthesias    Bilateral leg numbness 11/26/2015   Major depressive disorder, recurrent episode, 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[place]) 05/10/2015   Post partum depression 05/09/2015   Hematemesis 04/07/2015   Intractable vomiting with nausea 04/07/2015   Elevated liver enzymes    Epigastric pain    Pseudoseizure ([place]) 02/22/2015   Seizure-like activity ([place]) 02/22/2015   Fibromyalgia 02/18/2015   Vulvar fissure 02/18/2015   Clinical depression 02/01/2015   Current smoker 01/29/2015   Current tobacco use 01/29/2015   Hereditary and idiopathic peripheral neuropathy 01/15/2015   Leg weakness, bilateral 01/02/2015   Gait disorder 01/02/2015   Other symptoms and signs involving the musculoskeletal system 01/02/2015   Abnormal gait 01/02/2015   Cervicalgia 12/18/2014   Sciatica of left side 11/14/2014   Neuralgia neuritis, sciatic nerve 11/14/2014   Pseudoseizures ([place]) 09/12/2014   Encounter for sterilization 09/09/2014   Abdominal pain, acute, right lower quadrant 08/22/2014   Headache, migraine 08/19/2014   Seizure ([place]) 08/18/2014   Encephalopathy 08/13/2014   Headache 08/13/2014   Altered mental status 08/13/2014   Right leg numbness 07/31/2014   Disturbance of skin sensation 07/31/2014   Hypertension in pregnancy, transient 07/08/2014   Previous gastric bypass affecting pregnancy, antepartum 05/22/2014    History of bariatric surgery 05/22/2014   Patent foramen ovale with right to left shunt 05/17/2014   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   Antepartum mental disorder in pregnancy 05/09/2014   Rapid palpitations 02/18/2014   H/O maternal third degree perineal laceration, currently pregnant 02/18/2014   High-risk pregnancy 02/18/2014   Supervision of pregnancy with other poor reproductive or obstetric history, unspecified trimester 02/18/2014   Restless legs 01/16/2014   Restless leg 01/16/2014   Chronic interstitial cystitis 10/23/2013   Menorrhagia 07/18/2013   Excessive and frequent menstruation 07/18/2013   h/o Opiate addiction 03/11/2012    Class: Acute   History of migraine headaches 03/10/2012    Class: Acute   Depression with anxiety 03/10/2012    Class: Chronic   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 03/10/2012    Class: Chronic   Attention deficit hyperactivity disorder, 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    Class: Chronic   Panic disorder without  agoraphobia 03/10/2012   H/O disease 03/10/2012   Dysthymia 03/10/2012   Conversion disorder with seizures or convulsions 03/10/2012   Other specified behavioral and emotional disorders with onset usually occurring in childhood and adolescence 03/10/2012   Pelvic congestion syndrome 10/13/2011   Coitalgia 10/13/2011   Chronic migraine without aura 10/13/2011   Unspecified dyspareunia 10/13/2011   Other specified conditions associated with female genital organs and menstrual cycle 10/13/2011   IBS (irritable bowel syndrome) 08/25/2011   OBESITY, UNSPECIFIED 09/17/2010   Iron deficiency anemia 07/23/2010    Past Surgical History:  Procedure Laterality Date   BIOPSY  04/10/2015   Procedure: BIOPSY;  Surgeon: Daneil Dolin, MD;  Location: [facility];  Service: Endoscopy;;   BIOPSY  04/15/2021   Procedure: BIOPSY;  Surgeon: Montez Morita, Quillian Quince, MD;  Location: [facility];  Service:  Gastroenterology;;   cath self every nite     for sodium bicarb injection (discontinued 2013)   CHOLECYSTECTOMY  2005   biliary dyskinesia   COLONOSCOPY  JUN 2012 ABD PN/DIARRHEA WITH PROPOFOL   NL COLON   COLONOSCOPY WITH PROPOFOL N/A 01/16/2021   Procedure: COLONOSCOPY WITH PROPOFOL;  Surgeon: Harvel Quale, MD;  Location: [facility];  Service: Gastroenterology;  Laterality: N/A;  1:45   DILATION AND CURETTAGE OF UTERUS     DILITATION & CURRETTAGE/HYSTROSCOPY WITH NOVASURE ABLATION N/A 03/24/2017   Procedure: DILATATION & CURETTAGE/HYSTEROSCOPY WITH NOVASURE ENDOMETRIAL ABLATION;  Surgeon: Jonnie Kind, MD;  Location: [facility];  Service: Gynecology;  Laterality: N/A;   ESOPHAGEAL DILATION N/A 04/10/2015   Procedure: ESOPHAGEAL DILATION WITH 54FR MALONEY DILATOR;  Surgeon: Daneil Dolin, MD;  Location: [facility];  Service: Endoscopy;  Laterality: N/A;   ESOPHAGOGASTRODUODENOSCOPY     ESOPHAGOGASTRODUODENOSCOPY (EGD) WITH PROPOFOL N/A 04/10/2015   Procedure: ESOPHAGOGASTRODUODENOSCOPY (EGD) WITH PROPOFOL;  Surgeon: Daneil Dolin, MD;  Location: [facility];  Service: Endoscopy;  Laterality: N/A;   ESOPHAGOGASTRODUODENOSCOPY (EGD) WITH PROPOFOL N/A 12/06/2016   Procedure: ESOPHAGOGASTRODUODENOSCOPY (EGD) WITH PROPOFOL;  Surgeon: Danie Binder, MD;  [facility];  Service: Endoscopy;  Laterality: N/A;   ESOPHAGOGASTRODUODENOSCOPY (EGD) WITH PROPOFOL N/A 04/27/2019   Procedure: ESOPHAGOGASTRODUODENOSCOPY (EGD) WITH PROPOFOL;  Surgeon: Rogene Houston, MD;  [facility];  Service: Endoscopy;  Laterality: N/A;  730   ESOPHAGOGASTRODUODENOSCOPY (EGD) WITH PROPOFOL N/A 08/31/2019   Procedure: ESOPHAGOGASTRODUODENOSCOPY (EGD) WITH PROPOFOL;  Surgeon: Rogene Houston, MD;  [facility];  Service: Endoscopy;  Laterality: N/A;   ESOPHAGOGASTRODUODENOSCOPY (EGD) WITH PROPOFOL N/A 01/16/2021   Procedure: ESOPHAGOGASTRODUODENOSCOPY (EGD) WITH PROPOFOL;  Surgeon: Harvel Quale, MD;  [facility];  Service: Gastroenterology;  Laterality: N/A;   ESOPHAGOGASTRODUODENOSCOPY (EGD) WITH PROPOFOL N/A 04/15/2021   Procedure: ESOPHAGOGASTRODUODENOSCOPY (EGD) WITH PROPOFOL;  Surgeon: Harvel Quale, MD;  [facility];  Service: Gastroenterology;  Laterality: N/A;  9:00 AM   GAB  2007   in [place]-POUCH 5 CM   GASTRIC BYPASS  06/2006   HYSTEROSCOPY WITH D & C N/A 09/12/2014   Procedure: DILATATION AND CURETTAGE /HYSTEROSCOPY;  Surgeon: Jonnie Kind, MD;  Location: [facility];  Service: Gynecology;  Laterality: N/A;   KYPHOPLASTY N/A 08/02/2018   Procedure: KYPHOPLASTY T12;  Surgeon: Melina Schools, MD;  Location: [facility];  Service: Orthopedics;  Laterality: N/A;  60 mins   LAPAROSCOPIC TUBAL LIGATION Bilateral 03/24/2017   Procedure: LAPAROSCOPIC TUBAL LIGATION ([place] Rings);  Surgeon: Jonnie Kind, MD;  Location: [facility];  Service: Gynecology;  Laterality: Bilateral;   REPAIR VAGINAL CUFF N/A 07/30/2014   Procedure: REPAIR VAGINAL CUFF;  Surgeon: Mora Bellman, MD;  Location: [facility];  Service: Gynecology;  Laterality: N/A;   SAVORY DILATION  06/20/2012   Dr. Barnie Alderman gastritis/Ulcer in the mid jejunum. Empiric dilation.    SURAL NERVE BX Left 02/25/2016   Procedure: LEFT SURAL NERVE BIOPSY;  Surgeon: Jovita Gamma, MD;  Location: [place] [facility];  Service: Neurosurgery;  Laterality: Left;  Left sural nerve biopsy   TONSILLECTOMY     TONSILLECTOMY AND ADENOIDECTOMY     UPPER GASTROINTESTINAL ENDOSCOPY  JULY 2011 NAUSEA-D125,V6, PH 25   Bx; GASTRITIS, POUCH-5 CM LONG   WISDOM TOOTH EXTRACTION       OB History     Gravida  3   Para  2   Term  1   Preterm  1   AB  1   Living  2      SAB  1   IAB      Ectopic      Multiple      Live Births  2           Family History  Problem Relation Age of Onset   Coronary artery disease Paternal Grandfather    Migraines Paternal Grandmother    Breast cancer  Paternal Grandmother    Hemochromatosis Maternal Grandmother    Migraines Maternal Grandmother    Cancer Maternal Grandmother    Breast cancer Maternal Grandmother    Hypertension Father    Diabetes Father    Coronary artery disease Father    Cancer Mother        breast   Hemochromatosis Mother    Breast cancer Mother    Depression Mother    Anxiety disorder Mother    Anxiety disorder Brother    Bipolar disorder Brother    Healthy Daughter    Healthy Son     Social History   Tobacco Use   Smoking status: Former    Packs/day: 0.25    Years: 6.00    Pack years: 1.50    Types: Cigarettes    Start date: 05/2018   Smokeless tobacco: Never  Vaping Use   Vaping Use: Some days  Substance Use Topics   Alcohol use: Not Currently    Alcohol/week: 0.0 standard drinks   Drug use: Not Currently    Home Medications Prior to Admission medications   Medication Sig Start Date End Date Taking? Authorizing Provider  amphetamine-dextroamphetamine (ADDERALL XR) 30 MG 24 hr capsule Take 1 capsule (30 mg total) by mouth every morning. 07/13/21   Cloria Spring, MD  Calcium Carb-Cholecalciferol (CALCIUM-VITAMIN D3) 600-500 MG-UNIT CAPS Take 1 tablet by mouth daily.    [provider]  Carboxymethylcellul-Glycerin (LUBRICATING EYE DROPS OP) Place 1 drop into both eyes daily as needed (dry eyes).    [provider]  cetirizine (ZYRTEC) 10 MG tablet TAKE ONE TABLET BY MOUTH DAILY Patient taking differently: Take 10 mg by mouth daily. 03/17/21   Valentina Shaggy, MD  clidinium-chlordiazePOXIDE (LIBRAX) 5-2.5 MG capsule Take 1 capsule by mouth 3 (three) times daily before meals. 03/17/21   Harvel Quale, MD  clonazePAM (KLONOPIN) 0.5 MG tablet Take 1 tablet (0.5 mg total) by mouth 2 (two) times daily as needed. 07/13/21   Cloria Spring, MD  DULoxetine (CYMBALTA) 60 MG capsule Take 1 capsule (60 mg total) by mouth 2 (two) times daily. 07/13/21   [name]  R, MD   Erenumab-aooe (AIMOVIG) 140 MG/ML SOAJ Inject 140 mg into the skin every 28 (twenty-eight) days. 08/28/21   Patel, Donika K, DO  fluticasone (FLOVENT HFA) 220 MCG/ACT inhaler Inhale 2 puffs into the lungs 2 (two) times daily. Do not inhale, swallow powder and drink water , do not eat food for 20 minutes after puffs 04/23/21   Montez Morita, Quillian Quince, MD  HYDROmorphone (DILAUDID) 4 MG tablet Take 4 mg by mouth every 4 (four) hours as needed for severe pain.    [provider]  lansoprazole (PREVACID) 30 MG capsule Take 1 capsule (30 mg total) by mouth daily. 04/15/21   Harvel Quale, MD  levalbuterol [facility] HFA) 45 MCG/ACT inhaler INHALE (2) PUFFS INTO THE LUNGS EVERY SIX HOURS AS NEEDED FOR WHEEZING. Patient taking differently: Inhale 2 puffs into the lungs every 6 (six) hours as needed for wheezing or shortness of breath. INHALE (2) PUFFS INTO THE LUNGS EVERY SIX HOURS AS NEEDED FOR WHEEZING. 02/24/21   Valentina Shaggy, MD  levalbuterol Penne Lash) 1.25 MG/3ML nebulizer solution Take 1.25 mg by nebulization every 4 (four) hours as needed for wheezing. 02/24/21   Valentina Shaggy, MD  linaclotide [facility]) 290 MCG CAPS capsule Take 1 capsule (290 mcg total) by mouth daily before breakfast. 03/26/21   Montez Morita, Quillian Quince, MD  meclizine (ANTIVERT) 25 MG tablet Take 1 tablet (25 mg total) by mouth 3 (three) times daily as needed for dizziness. 05/07/21   Mar Daring, PA-C  metroNIDAZOLE (METROGEL VAGINAL) 0.75 % vaginal gel Place 1 Applicatorful vaginally at bedtime. 08/31/21   Estill Dooms, NP  pantoprazole (PROTONIX) 40 MG tablet TAKE 1 TABLET BY MOUTH TWICE DAILY BEFORE MEALS. 07/09/21   Kathyrn Drown, MD  Pediatric Multivit-Minerals-C (FLINTSTONES GUMMIES) chewable tablet Chew 3 tablets by mouth daily.    [provider]  promethazine (PHENERGAN) 12.5 MG tablet TAKE 1 TABLET BY MOUTH EVERY 8 HOURS AS NEEDED FOR NAUSEA OR VOMITING 03/17/21    Montez Morita, Quillian Quince, MD  promethazine (PHENERGAN) 25 MG tablet Take 25 mg by mouth 2 (two) times daily as needed for nausea or vomiting.    [provider]  tiZANidine (ZANAFLEX) 4 MG tablet Take 4 mg by mouth 3 (three) times daily as needed. 03/30/21   [provider]  Vitamin D, Ergocalciferol, (DRISDOL) 1.25 MG (50000 UNIT) CAPS capsule Take 50,000 Units by mouth every Wednesday.    [provider]    Allergies    Gabapentin, Metoclopramide hcl, Tramadol, Ativan [lorazepam], Latex, Lyrica [pregabalin], Iron, Sulfa antibiotics, Butrans [buprenorphine], Nucynta [tapentadol], Propofol, Tape, and Toradol [ketorolac tromethamine]  Review of Systems   Review of Systems  Constitutional:  Negative for chills, fatigue and fever.  Eyes:  Negative for visual disturbance.  Respiratory:  Negative for shortness of breath.   Cardiovascular:  Negative for chest pain and palpitations.  Gastrointestinal:  Negative for abdominal pain, diarrhea, nausea and vomiting.  Genitourinary:  Negative for dysuria, flank pain and hematuria.  Musculoskeletal:  Positive for back pain. Negative for arthralgias, myalgias, neck pain and neck stiffness.  Skin:  Negative for rash.  Neurological:  Positive for tremors and headaches. Negative for dizziness, syncope, speech difficulty, weakness and numbness (Numbness and tingling of the right calf to the right foot).  Hematological:  Does not bruise/bleed easily.   Physical Exam Updated Vital Signs BP 117/86 (BP Location: Right Arm)   Pulse 94   Temp (!) 97 F (36.1 C) (Temporal)  Resp 16   Ht $R'5\' 5"'yL$  (1.651 m)   Wt 68 kg   SpO2 100%   BMI 24.96 kg/m   Physical Exam Vitals and nursing note reviewed.  Constitutional:      Appearance: Normal appearance. She is not toxic-appearing.     Comments: Patient is anxious appearing.  Nontoxic patient does not appear postictal  HENT:     Head: Atraumatic.     Mouth/Throat:     Mouth: Mucous  membranes are moist.  Eyes:     Extraocular Movements: Extraocular movements intact.     Conjunctiva/sclera: Conjunctivae normal.     Pupils: Pupils are equal, round, and reactive to light.  Neck:     Thyroid: No thyromegaly.     Trachea: Phonation normal.     Meningeal: Kernig's sign absent.  Cardiovascular:     Rate and Rhythm: Normal rate and regular rhythm.     Pulses: Normal pulses.  Pulmonary:     Effort: Pulmonary effort is normal. No respiratory distress.  Abdominal:     Palpations: Abdomen is soft.     Tenderness: There is no abdominal tenderness.  Musculoskeletal:        General: No tenderness.     Cervical back: Normal range of motion. No rigidity.     Right lower leg: No edema.     Left lower leg: No edema.  Skin:    General: Skin is warm.     Capillary Refill: Capillary refill takes less than 2 seconds.     Findings: No rash.  Neurological:     General: No focal deficit present.     Mental Status: She is alert.     GCS: GCS eye subscore is 4. GCS verbal subscore is 5. GCS motor subscore is 6.     Sensory: Sensation is intact. No sensory deficit.     Motor: Motor function is intact. No weakness.     Coordination: Coordination normal.     Comments: Cranial nerves II through XII intact.  Speech clear.  No pronator drift.  No facial droop.  No tremor  Psychiatric:        Mood and Affect: Mood is anxious.        Speech: Speech normal.        Behavior: Behavior normal. Behavior is cooperative.        Thought Content: Thought content normal.    ED Results / Procedures / Treatments   Labs (all labs ordered are listed, but only abnormal results are displayed) Labs Reviewed  CBC WITH DIFFERENTIAL/PLATELET - Abnormal; Notable for the following components:      Result Value   Eosinophils Absolute 0.6 (*)    All other components within normal limits  BASIC METABOLIC PANEL - Abnormal; Notable for the following components:   Glucose, Bld 65 (*)    All other components  within normal limits  URINALYSIS, ROUTINE W REFLEX MICROSCOPIC - Abnormal; Notable for the following components:   APPearance HAZY (*)    Leukocytes,Ua TRACE (*)    All other components within normal limits  RAPID URINE DRUG SCREEN, HOSP PERFORMED - Abnormal; Notable for the following components:   Opiates POSITIVE (*)    Benzodiazepines POSITIVE (*)    Amphetamines POSITIVE (*)    All other components within normal limits  ACETAMINOPHEN LEVEL - Abnormal; Notable for the following components:   Acetaminophen (Tylenol), Serum <10 (*)    All other components within normal limits  SALICYLATE LEVEL - Abnormal; Notable  for the following components:   Salicylate Lvl <0.2 (*)    All other components within normal limits  URINE CULTURE  PREGNANCY, URINE  ETHANOL    EKG EKG Interpretation  Date/Time:  Monday August 31 2021 14:58:11 EDT Ventricular Rate:  64 PR Interval:  164 QRS Duration: 80 QT Interval:  422 QTC Calculation: 435 R Axis:   73 Text Interpretation: Normal sinus rhythm Septal infarct , age undetermined Abnormal ECG Confirmed by Sherwood Gambler [phone]) on 08/31/2021 3:19:58 PM  Radiology No results found.  Procedures Procedures   Medications Ordered in ED Medications - No data to display  ED Course  I have reviewed the triage vital signs and the nursing notes.  Pertinent labs & imaging results that were available during my care of the patient were reviewed by me and considered in my medical decision making (see chart for details).    MDM Rules/Calculators/A&P                           Patient here with known history of migraines, anxiety, and pseudoseizures sent here from radiology department for generalized shaking and sweating.  Patient reporting left-sided headache that is similar to previous migraines.  On exam, patient is anxious appearing.  No tremors noted.  No focal neurodeficits.  No nuchal rigidity and she does not appear postictal.  She complains  of persistent low back pain and focal paresthesias of the mid right calf to the level of her foot.  She has been seen here recently for this and had MRI of the L-spine and brain without significant findings.  I feel that patient's symptoms are likely related to atypical migraine.  This may also have been a pseudoseizure.  Given recent CT and MRI, I do not feel that repeat imaging is necessary.  We will check labs, EKG and urine drug screen  Labs interpreted by me, urinalysis shows trace leukocytes with 6-10 WBCs and no bacteria.  On further history, patient does endorse some burning with urination and increased frequency.  This may represent early UTI.  Will obtain urine culture and give IV Rocephin here.  U pregnant negative salicylate, acetaminophen and ethanol levels unremarkable, no electrolyte derangement or leukocytosis.  UDS positive for opiates, benzodiazepines and amphetamines.  EKG without ischemic change.  Patient will be given migraine cocktail and reassess.  On recheck, patient resting comfortably.  She has received IV Rocephin without difficulty.  Headache improved after medication given.  Clinically I have low suspicion for acute UTI, will wait for culture results before giving additional antibiotics.  Patient appears appropriate for discharge home and she states that she has a follow-up with her endocrinologist regarding her cortisol levels.  Return precautions discussed.  Final Clinical Impression(s) / ED Diagnoses Final diagnoses:  Dysuria  Migraine without status migrainosus, not intractable, unspecified migraine type    Rx / DC Orders ED Discharge Orders     None        Kem Parkinson, PA-C 09/01/21 1435    Sherwood Gambler, MD 09/04/21 [phone]

## 2021-08-31 NOTE — Progress Notes (Signed)
+  BV on pap, will rx metrogel

## 2021-08-31 NOTE — Discharge Instructions (Addendum)
Your urine culture is pending.  Results may take 2 to 3 days.  You have been given antibiotics tonight.  If your urine culture shows infection, you will be called in additional antibiotics to take by mouth.  Please follow-up with your primary care doctor for recheck.  Return emergency department for any new or worsening symptoms.

## 2021-09-01 ENCOUNTER — Other Ambulatory Visit: Payer: Self-pay | Admitting: Family Medicine

## 2021-09-01 ENCOUNTER — Other Ambulatory Visit (HOSPITAL_COMMUNITY): Payer: Self-pay | Admitting: Psychiatry

## 2021-09-01 ENCOUNTER — Telehealth: Payer: Self-pay

## 2021-09-01 NOTE — Telephone Encounter (Signed)
Transition Care Management Follow-up Telephone Call Date of discharge and from where: 08/31/2021-[place] How have you been since you were released from the hospital? Patient stated she is doing ok Any questions or concerns? No  Items Reviewed: Did the pt receive and understand the discharge instructions provided? Yes  Medications obtained and verified? Yes  Other? No  Any new allergies since your discharge? No  Dietary orders reviewed? N/A Do you have support at home? Yes   Home Care and Equipment/Supplies: Were home health services ordered? not applicable If so, what is the name of the agency? N/A  Has the agency set up a time to come to the patient's home? not applicable Were any new equipment or medical supplies ordered?  No What is the name of the medical supply agency? N/A Were you able to get the supplies/equipment? not applicable Do you have any questions related to the use of the equipment or supplies? No  Functional Questionnaire: (I = Independent and D = Dependent) ADLs: I  Bathing/Dressing- I  Meal Prep- I  Eating- I  Maintaining continence- I  Transferring/Ambulation- I  Managing Meds- I  Follow up appointments reviewed:  PCP Hospital f/u appt confirmed? No   Specialist Hospital f/u appt confirmed? No   Are transportation arrangements needed? No  If their condition worsens, is the pt aware to call PCP or go to the Emergency Dept.? Yes Was the patient provided with contact information for the PCP's office or ED? Yes Was to pt encouraged to call back with questions or concerns? Yes

## 2021-09-02 DIAGNOSIS — R634 Abnormal weight loss: Secondary | ICD-10-CM | POA: Diagnosis not present

## 2021-09-02 DIAGNOSIS — E2740 Unspecified adrenocortical insufficiency: Secondary | ICD-10-CM | POA: Diagnosis not present

## 2021-09-02 DIAGNOSIS — E559 Vitamin D deficiency, unspecified: Secondary | ICD-10-CM | POA: Diagnosis not present

## 2021-09-02 DIAGNOSIS — M8580 Other specified disorders of bone density and structure, unspecified site: Secondary | ICD-10-CM | POA: Diagnosis not present

## 2021-09-02 LAB — URINE CULTURE: Culture: NO GROWTH

## 2021-09-02 NOTE — Telephone Encounter (Signed)
F/u   Afifa Crissman (Key: [id number]) Aimovig 70MG /ML auto-injectors   Form [facility] Electronic [place] Form [phone] NCPDP) Created 9 days ago Sent to Plan 9 days ago Plan Response 9 days ago Submit Clinical Questions Determination Message from Plan The member recently filled this medication and will be able to return for their next refill according to their plan limits.

## 2021-09-03 ENCOUNTER — Ambulatory Visit: Payer: Medicaid Other | Admitting: Neurology

## 2021-09-07 ENCOUNTER — Encounter (HOSPITAL_COMMUNITY): Payer: Self-pay | Admitting: Psychiatry

## 2021-09-07 ENCOUNTER — Other Ambulatory Visit: Payer: Self-pay

## 2021-09-07 ENCOUNTER — Telehealth (INDEPENDENT_AMBULATORY_CARE_PROVIDER_SITE_OTHER): Payer: Medicaid Other | Admitting: Psychiatry

## 2021-09-07 DIAGNOSIS — F900 Attention-deficit hyperactivity disorder, predominantly inattentive type: Secondary | ICD-10-CM | POA: Diagnosis not present

## 2021-09-07 DIAGNOSIS — F332 Major depressive disorder, recurrent severe without psychotic features: Secondary | ICD-10-CM | POA: Diagnosis not present

## 2021-09-07 DIAGNOSIS — F411 Generalized anxiety disorder: Secondary | ICD-10-CM | POA: Diagnosis not present

## 2021-09-07 MED ORDER — DULOXETINE HCL 60 MG PO CPEP: 60.0000 mg | ORAL_CAPSULE | Freq: Two times a day (BID) | ORAL | 2 refills | Status: DC

## 2021-09-07 MED ORDER — AMPHETAMINE-DEXTROAMPHET ER 30 MG PO CP24: 30.0000 mg | ORAL_CAPSULE | ORAL | 0 refills | Status: DC

## 2021-09-07 MED ORDER — CLONAZEPAM 0.5 MG PO TABS: 0.5000 mg | ORAL_TABLET | Freq: Two times a day (BID) | ORAL | 2 refills | Status: DC | PRN

## 2021-09-07 MED ORDER — TRAZODONE HCL 100 MG PO TABS: 200.0000 mg | ORAL_TABLET | Freq: Every day | ORAL | 2 refills | Status: DC

## 2021-09-07 NOTE — Progress Notes (Signed)
Virtual Visit via Video Note  I connected with Penny [name] on 09/07/21 at 11:40 AM EDT by a video enabled telemedicine application and verified that I am speaking with the correct person using two identifiers.  Location: Patient: home Provider: home office   I discussed the limitations of evaluation and management by telemedicine and the availability of in person appointments. The patient expressed understanding and agreed to proceed.     I discussed the assessment and treatment plan with the patient. The patient was provided an opportunity to ask questions and all were answered. The patient agreed with the plan and demonstrated an understanding of the instructions.   The patient was advised to call back or seek an in-person evaluation if the symptoms worsen or if the condition fails to improve as anticipated.  I provided 15 minutes of non-face-to-face time during this encounter.    , MD  BH MD/PA/NP OP Progress Note  09/07/2021 11:56 AM Penny [name]  MRN:  [id number]  Chief Complaint:  Chief Complaint   Anxiety; Depression; ADD; Follow-up    HPI: This patient is a 35-year-old female who is now living alone in [place].  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  She has 2 children with whom she shares custody with their fathers.  The patient returns for follow-up after 2 months.  She states she is in a lot of pain today particular in her back and lower legs.  She also states that her lower left leg is very.  She is seeing a neurologist and is going to have an EMG done by her report.  She states that she is not sleeping.  Her mother threw away her amitriptyline because she thought it was causing more confusion.  Lack of sleep makes her feel more depressed and unable to function.  She appears drowsy today.  She would like to go back to the trazodone and I think this is reasonable as long as she is not oversedated.  She states that the pain that she is and  has made her more depressed but she is still trying to do things with both of her children.  She denies suicidal ideation.  I urged her to discuss the pain situation with her pain management specialist. Visit Diagnosis:    ICD-10-CM   1. Major depressive disorder, recurrent episode, 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HCC)  F33.2     2. GAD (generalized anxiety disorder)  F41.1     3. Attention deficit hyperactivity disorder (ADHD), predominantly inattentive type  F90.0       Past Psychiatric History: Long-term outpatient treatment, hospitalization last year for depression  Past Medical History:  Past Medical History:  Diagnosis Date   ADD (attention deficit disorder)    Anemia 2011   2o to GASTRIC BYPASS   Anginal pain (HCC)    Anxiety    Asthma    Blood transfusion without reported diagnosis    Chronic daily headache    Depression    Depression    Dysmenorrhea 09/18/2013   Dysrhythmia    Elevated liver enzymes JUL 2011ALK PHOS 111-127 AST  143-267 ALT  213-321T BILI 0.6  ALB  3.7-4.06 Jun 2011 ALK PHOS 118 AST 24 ALT 42 T BILI 0.4 ALB 3.9   Encounter for drug rehabilitation    at behavioral health for opioid addiction about 4 years ago   Family history of adverse reaction to anesthesia    'dad had to be kept   on pump for breathing for morphine'   Fatty liver    Fibroid 01/18/2017   Fibromyalgia    Gastric bypass status for obesity    Gastritis JULY 2011   Heart murmur    Hereditary and idiopathic peripheral neuropathy 01/15/2015   History of Holter monitoring    Hx of opioid abuse ([place])    for about 4 years, about 4 years ago   Interstitial cystitis    Iron deficiency anemia 07/23/2010   Irritable bowel syndrome 2012 DIARRHEA   JUN 2012 TTG IgA 14.9   IUD FEB 2010   Lupus ([place])    Menorrhagia 07/18/2013   Migraines    Obesity (BMI 30-39.9) 2011 228 LBS BMI 36.8   Osteoporosis    Ovarian cyst    Patient desires pregnancy 09/18/2013   Polyneuropathy    PONV  (postoperative nausea and vomiting) seizure post-operatively   seizure following ankle surgery   Potassium (K) deficiency    Pregnant 12/25/2013   Psychiatric pseudoseizure    RLQ abdominal pain 07/18/2013   Sciatica of left side 11/14/2014   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   Stress 09/18/2013    Past Surgical History:  Procedure Laterality Date   BIOPSY  04/10/2015   Procedure: BIOPSY;  Surgeon: Daneil Dolin, MD;  Location: [facility];  Service: Endoscopy;;   BIOPSY  04/15/2021   Procedure: BIOPSY;  Surgeon: Montez Morita, Quillian Quince, MD;  Location[facility];  Service: Gastroenterology;;   cath self every nite     for sodium bicarb injection (discontinued 2013)   CHOLECYSTECTOMY  2005   biliary dyskinesia   COLONOSCOPY  JUN 2012 ABD PN/DIARRHEA WITH PROPOFOL   NL COLON   COLONOSCOPY WITH PROPOFOL N/A 01/16/2021   Procedure: COLONOSCOPY WITH PROPOFOL;  Surgeon: Harvel Quale, MD;  [facility];  Service: Gastroenterology;  Laterality: N/A;  1:45   DILATION AND CURETTAGE OF UTERUS     DILITATION & CURRETTAGE/HYSTROSCOPY WITH NOVASURE ABLATION N/A 03/24/2017   Procedure: DILATATION & CURETTAGE/HYSTEROSCOPY WITH NOVASURE ENDOMETRIAL ABLATION;  Surgeon: Jonnie Kind, MD;  Location: [facility];  Service: Gynecology;  Laterality: N/A;   ESOPHAGEAL DILATION N/A 04/10/2015   Procedure: ESOPHAGEAL DILATION WITH 54FR MALONEY DILATOR;  Surgeon: Daneil Dolin, MD;  Location: [facility];  Service: Endoscopy;  Laterality: N/A;   ESOPHAGOGASTRODUODENOSCOPY     ESOPHAGOGASTRODUODENOSCOPY (EGD) WITH PROPOFOL N/A 04/10/2015   Procedure: ESOPHAGOGASTRODUODENOSCOPY (EGD) WITH PROPOFOL;  Surgeon: Daneil Dolin, MD;  Location: [facility];  Service: Endoscopy;  Laterality: N/A;   ESOPHAGOGASTRODUODENOSCOPY (EGD) WITH PROPOFOL N/A 12/06/2016   Procedure: ESOPHAGOGASTRODUODENOSCOPY (EGD) WITH PROPOFOL;  Surgeon: Danie Binder, MD;  [facility];  Service: Endoscopy;  Laterality:  N/A;   ESOPHAGOGASTRODUODENOSCOPY (EGD) WITH PROPOFOL N/A 04/27/2019   Procedure: ESOPHAGOGASTRODUODENOSCOPY (EGD) WITH PROPOFOL;  Surgeon: Rogene Houston, MD;  [facility];  Service: Endoscopy;  Laterality: N/A;  730   ESOPHAGOGASTRODUODENOSCOPY (EGD) WITH PROPOFOL N/A 08/31/2019   Procedure: ESOPHAGOGASTRODUODENOSCOPY (EGD) WITH PROPOFOL;  Surgeon: Rogene Houston, MD;  [facility];  Service: Endoscopy;  Laterality: N/A;   ESOPHAGOGASTRODUODENOSCOPY (EGD) WITH PROPOFOL N/A 01/16/2021   Procedure: ESOPHAGOGASTRODUODENOSCOPY (EGD) WITH PROPOFOL;  Surgeon: Harvel Quale, MD;  [facility];  Service: Gastroenterology;  Laterality: N/A;   ESOPHAGOGASTRODUODENOSCOPY (EGD) WITH PROPOFOL N/A 04/15/2021   Procedure: ESOPHAGOGASTRODUODENOSCOPY (EGD) WITH PROPOFOL;  Surgeon: Harvel Quale, MD;  [facility];  Service: Gastroenterology;  Laterality: N/A;  9:00 AM   GAB  2007   in [place]-POUCH 5 CM   GASTRIC BYPASS  06/2006   HYSTEROSCOPY WITH D & C N/A 09/12/2014   Procedure: DILATATION AND CURETTAGE /HYSTEROSCOPY;  Surgeon: John Ferguson V, MD;  Location: [facility];  Service: Gynecology;  Laterality: N/A;   KYPHOPLASTY N/A 08/02/2018   Procedure: KYPHOPLASTY T12;  Surgeon: Brooks, Dahari, MD;  Location: [facility];  Service: Orthopedics;  Laterality: N/A;  60 mins   LAPAROSCOPIC TUBAL LIGATION Bilateral 03/24/2017   Procedure: LAPAROSCOPIC TUBAL LIGATION ([place] Rings);  Surgeon: John Ferguson V, MD;  Location: [facility];  Service: Gynecology;  Laterality: Bilateral;   REPAIR VAGINAL CUFF N/A 07/30/2014   Procedure: REPAIR VAGINAL CUFF;  Surgeon: [name] Constant, MD;  Location: [facility];  Service: Gynecology;  Laterality: N/A;   SAVORY DILATION  06/20/2012   Dr. Fields:Mild gastritis/Ulcer in the mid jejunum. Empiric dilation.    SURAL NERVE BX Left 02/25/2016   Procedure: LEFT SURAL NERVE BIOPSY;  Surgeon: Robert Nudelman, MD;  [facility];  Service: Neurosurgery;  Laterality: Left;  Left sural nerve biopsy   TONSILLECTOMY     TONSILLECTOMY AND ADENOIDECTOMY     UPPER GASTROINTESTINAL ENDOSCOPY  JULY 2011 NAUSEA-D125,V6, PH 25   Bx; GASTRITIS, POUCH-5 CM LONG   WISDOM TOOTH EXTRACTION      Family Psychiatric History: see below  Family History:  Family History  Problem Relation Age of Onset   Coronary artery disease Paternal Grandfather    Migraines Paternal Grandmother    Breast cancer Paternal Grandmother    Hemochromatosis Maternal Grandmother    Migraines Maternal Grandmother    Cancer Maternal Grandmother    Breast cancer Maternal Grandmother    Hypertension Father    Diabetes Father    Coronary artery disease Father    Cancer Mother        breast   Hemochromatosis Mother    Breast cancer Mother    Depression Mother    Anxiety disorder Mother    Anxiety disorder Brother    Bipolar disorder Brother    Healthy Daughter    Healthy Son     Social History:  Social History   Socioeconomic History   Marital status: Divorced    Spouse name: Not on file   Number of children: 2   Years of education: some college   Highest education level: Some college, no degree  Occupational History   Occupation: Applying for Social Security Disability    Employer: NOT EMPLOYED  Tobacco Use   Smoking status: Former    Packs/day: 0.25    Years: 6.00    Pack years: 1.50    Types: Cigarettes    Start date: 05/2018   Smokeless tobacco: Never  Vaping Use   Vaping Use: Some days  Substance and Sexual Activity   Alcohol use: Not Currently    Alcohol/week: 0.0 standard drinks   Drug use: Not Currently   Sexual activity: Yes    Partners: Male    Birth control/protection: Surgical, Condom    Comment: tubal and ablation  Other Topics Concern   Not on file  Social History Narrative   Patient is right handed.   Patient drinks one cup of coffee daily.   She lives in a one-story home with her two children (6 year  old son, 16 month old daughter).   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 [employer]      Social Determinants of Health   Financial Resource Strain: Medium Risk   Difficulty of Paying Living Expenses: Somewhat hard  Food Insecurity: Food Insecurity Present   Worried About Running Out of Food in the Last Year: Sometimes true   Ran Out of Food in the Last Year: Sometimes true  Transportation Needs: No Transportation Needs   Lack of Transportation (Medical): No   Lack of Transportation (Non-Medical): No  Physical Activity: Inactive   Days of Exercise per Week: 0 days   Minutes of Exercise per Session: 10 min  Stress: Stress Concern Present   Feeling of Stress : Very much  Social Connections: Moderately Isolated   Frequency of Communication with Friends and Family: More than three times a week   Frequency of Social Gatherings with Friends and Family: Once a week   Attends Religious Services: 1 to 4 times per year   Active Member of Clubs or Organizations: No   Attends Club or Organization Meetings: Never   Marital Status: Divorced    Allergies:  Allergies  Allergen Reactions   Gabapentin Other (See Comments)    Pt states that she was unresponsive after taking this medication, but her vitals remained stable.     Metoclopramide Hcl Anxiety and Other (See Comments)    Pt states that she felt like she was trapped in a box, and could not get out.  Pt also states that she had temporary loss of movement, weakness, and tingling.     Tramadol Other (See Comments)    Seizures   Ativan [Lorazepam] Other (See Comments)    combative   Latex Itching, Rash and Other (See Comments)    Burns   Lyrica [Pregabalin] Other (See Comments)    suicidal   Iron Other (See Comments)    PATIENT IS INTOLERANT TO ORAL IRON , SHE DOES NOT ABSORB IT.     Sulfa Antibiotics Hives   Butrans [Buprenorphine] Rash    Patch caused rash, didn't help with pain   Nucynta  [Tapentadol] Nausea And Vomiting   Propofol Nausea And Vomiting   Tape Hives, Itching and Rash    Please use paper tape   Toradol [Ketorolac Tromethamine] Anxiety    Metabolic Disorder Labs: Lab Results  Component Value Date   HGBA1C 5.7 (H) 01/15/2015   Lab Results  Component Value Date   PROLACTIN 80.0 (H) 05/09/2015   Lab Results  Component Value Date   CHOL 95 03/10/2012   TRIG 50 03/10/2012   HDL 38 (L) 03/10/2012   CHOLHDL 2.5 03/10/2012   VLDL 10 03/10/2012   LDLCALC 47 03/10/2012   Lab Results  Component Value Date   TSH 1.350 01/01/2021   TSH 2.616 08/25/2019    Therapeutic Level Labs: No results found for: LITHIUM Lab Results  Component Value Date   VALPROATE 66 06/23/2020   No components found for:  CBMZ  Current Medications: Current Outpatient Medications  Medication Sig Dispense Refill   amphetamine-dextroamphetamine (ADDERALL XR) 30 MG 24 hr capsule Take 1 capsule (30 mg total) by mouth every morning. 30 capsule 0   Calcium Carb-Cholecalciferol (CALCIUM-VITAMIN D3) 600-500 MG-UNIT CAPS Take 1 tablet by mouth daily.     Carboxymethylcellul-Glycerin (LUBRICATING EYE DROPS OP) Place 1 drop into both eyes daily as needed (dry eyes).     cetirizine (ZYRTEC) 10 MG tablet TAKE ONE TABLET BY MOUTH DAILY (Patient taking differently: Take 10 mg by mouth daily.) 30 tablet 5   clidinium-chlordiazePOXIDE (LIBRAX) 5-2.5 MG capsule Take 1 capsule   by mouth 3 (three) times daily before meals. 90 capsule 5   clonazePAM (KLONOPIN) 0.5 MG tablet Take 1 tablet (0.5 mg total) by mouth 2 (two) times daily as needed. 60 tablet 2   DULoxetine (CYMBALTA) 60 MG capsule Take 1 capsule (60 mg total) by mouth 2 (two) times daily. 180 capsule 2   Erenumab-aooe (AIMOVIG) 140 MG/ML SOAJ Inject 140 mg into the skin every 28 (twenty-eight) days. 1 mL 11   fluticasone (FLOVENT HFA) 220 MCG/ACT inhaler Inhale 2 puffs into the lungs 2 (two) times daily. Do not inhale, swallow powder and  drink water , do not eat food for 20 minutes after puffs 4 each 2   HYDROmorphone (DILAUDID) 4 MG tablet Take 4 mg by mouth every 4 (four) hours as needed for severe pain.     lansoprazole (PREVACID) 30 MG capsule Take 1 capsule (30 mg total) by mouth daily. 180 capsule 1   levalbuterol (XOPENEX HFA) 45 MCG/ACT inhaler INHALE (2) PUFFS INTO THE LUNGS EVERY SIX HOURS AS NEEDED FOR WHEEZING. (Patient taking differently: Inhale 2 puffs into the lungs every 6 (six) hours as needed for wheezing or shortness of breath. INHALE (2) PUFFS INTO THE LUNGS EVERY SIX HOURS AS NEEDED FOR WHEEZING.) 15 g 3   levalbuterol (XOPENEX) 1.25 MG/3ML nebulizer solution Take 1.25 mg by nebulization every 4 (four) hours as needed for wheezing. 50 mL 3   linaclotide (LINZESS) 290 MCG CAPS capsule Take 1 capsule (290 mcg total) by mouth daily before breakfast. 90 capsule 3   meclizine (ANTIVERT) 25 MG tablet Take 1 tablet (25 mg total) by mouth 3 (three) times daily as needed for dizziness. 30 tablet 0   metroNIDAZOLE (METROGEL VAGINAL) 0.75 % vaginal gel Place 1 Applicatorful vaginally at bedtime. 70 g 0   pantoprazole (PROTONIX) 40 MG tablet TAKE 1 TABLET BY MOUTH TWICE DAILY BEFORE MEALS. 60 tablet 2   Pediatric Multivit-Minerals-C (FLINTSTONES GUMMIES) chewable tablet Chew 3 tablets by mouth daily.     promethazine (PHENERGAN) 12.5 MG tablet TAKE 1 TABLET BY MOUTH EVERY 8 HOURS AS NEEDED FOR NAUSEA OR VOMITING 30 tablet 0   promethazine (PHENERGAN) 25 MG tablet Take 25 mg by mouth 2 (two) times daily as needed for nausea or vomiting.     tiZANidine (ZANAFLEX) 4 MG tablet Take 4 mg by mouth 3 (three) times daily as needed.     traZODone (DESYREL) 100 MG tablet Take 2 tablets (200 mg total) by mouth at bedtime. 60 tablet 2   Vitamin D, Ergocalciferol, (DRISDOL) 1.25 MG (50000 UNIT) CAPS capsule Take 50,000 Units by mouth every Wednesday.     No current facility-administered medications for this visit.      Musculoskeletal: Strength & Muscle Tone: within normal limits Gait & Station: normal Patient leans: N/A  Psychiatric Specialty Exam: Review of Systems  Musculoskeletal:  Positive for arthralgias and back pain.  Psychiatric/Behavioral:  Positive for dysphoric mood and sleep disturbance.   All other systems reviewed and are negative.  There were no vitals taken for this visit.There is no height or weight on file to calculate BMI.  General Appearance: Casual and Fairly Groomed  Eye Contact:  Good  Speech:  Clear and Coherent  Volume:  Decreased  Mood:  Depressed  Affect:  Flat  Thought Process:  Goal Directed  Orientation:  Full (Time, Place, and Person)  Thought Content: Rumination   Suicidal Thoughts:  No  Homicidal Thoughts:  No  Memory:  Immediate;   Good   Recent;   Fair Remote;   Fair  Judgement:  Fair  Insight:  Shallow  Psychomotor Activity:  Decreased  Concentration:  Concentration: Fair and Attention Span: Fair  Recall:  Good  Fund of Knowledge: Good  Language: Good  Akathisia:  No  Handed:  Right  AIMS (if indicated): not done  Assets:  Communication Skills Desire for Improvement Resilience Social Support Talents/Skills  ADL's:  Intact  Cognition: WNL  Sleep:  Poor   Screenings: AIMS    Flowsheet Row [facility]  AIMS Total Score 0      AUDIT    Flowsheet Row Patient Outreach Telephone from 07/14/2020 in [facility] (Discharged[facility]osp-Admission (Discharged) [facility]  Alcohol Use Disorder Identification Test Final Score (AUDIT) 1 1 0      GAD-7    Flowsheet Row Office Visit from 08/25/2021 in [facility] Counselor from 12/11/2018 [facility]  from [facility]  Total GAD-7 Score 13 19 17      PHQ2-9    Flowsheet Row Video Visit [facility]2021 [facility] Office Visit from 08/25/2021 in [facility] Video Visit from 07/13/2021 [facility] Video Visit from 05/12/2021 [facility] Video Visit from 03/12/2021 [facility]  PHQ-2 Total Score 4 3 4 1 0  PHQ-9 Total Score 13 13 5 -- --      Flowsheet Row Video Visit [facility]/07[facility]2021 [facility] from 08/31/2021 in [facility] ED from 08/09/2021 in [facility] at   C-SSRS RISK CATEGORY No Risk No Risk No Risk        Assessment and Plan: Patient is a 35-year-old female with a history depression anxiety ADHD borderline personality disorder insomnia and pseudoseizures.  We will go back to trazodone as it is probably safer for her sleep.  She will go back to the 200 mg dosage.  She will continue Cymbalta 120 mg daily for depression, clonazepam 0.5 mg twice daily as needed for anxiety and Adderall XR 30 mg every morning for focus.  She will return to see me in 4 weeks    , MD 09/07/2021, 11:56 AM  

## 2021-09-08 NOTE — Telephone Encounter (Signed)
F/u   Message from Plan The member recently filled this medication and will be able to return for their next refill according to their plan limits.

## 2021-09-09 ENCOUNTER — Telehealth (HOSPITAL_COMMUNITY): Payer: Self-pay | Admitting: *Deleted

## 2021-09-09 ENCOUNTER — Telehealth: Payer: Self-pay | Admitting: Neurology

## 2021-09-09 NOTE — Telephone Encounter (Signed)
Pt called in stating she has an appointment with a psychiatrist tomorrow. She has had periods of time where she doesn't know where she is and hallucinations that last hours. Wants the call back to be to her mother Santiago Glad.

## 2021-09-09 NOTE — Telephone Encounter (Signed)
Informed patient with what provider stated and she verbalized understanding.  

## 2021-09-09 NOTE — Telephone Encounter (Signed)
She needs to discuss this with her psychiatrist, there is nothing that I can see stemming from a primary neurological condition to cause her lapse in memory and hallucinations.  Psychiatric conditions can do this.

## 2021-09-09 NOTE — Telephone Encounter (Signed)
Patient called stating that since her last appt, she have been having hallucinations. Per pt, her mother wants to talk with provider about these hallucinations due to its been quite a few now and wants to know what to do. Per pt her mom informed her that when she does have one, she can't remember where she is.   Staff asked patient if she discussed this issue with provider during her recent video visit on 09-07-2021 and patient stated she can't remember. Staff informed patient that due to her concerns, an appt will have to be scheduled and one was made. Staff informed patient that her mother will have to be there for appt and she agreed and stated she will be available.    Patient then called back and stated, since her appt is scheduled for this coming Thursday, what should she do if she gets overly stimulated. Per pt she feels like this happens that's what puts her in a hallucination state.   Staff informed patient that message will be sent to provider for advise and patient verbalized understanding and agreed.    Patient number is [phone]

## 2021-09-09 NOTE — Telephone Encounter (Signed)
Patient see's [name] at [facility] [phone]

## 2021-09-09 NOTE — Telephone Encounter (Signed)
For now, tell her to go to a quiet place and listen to relaxing music or sounds. She needs to see her therapist o help with this

## 2021-09-09 NOTE — Telephone Encounter (Signed)
Called and spoke to patients mother, Santiago Glad. Patients mother stated that they are wondering if Dr. Posey Pronto can reach out to patients Psychiatrist and explain to her that Dr. Posey Pronto does not think patients issues are coming from a neurological condition. Patients mother stated that the Psychiatrist keeps telling them to go back to Dr. Posey Pronto because it is Neurological and not a Phychiatric issue. Patients mother expressed frustration of "being sent back and forth" and feels that if Dr. Posey Pronto can talk to patients psychiatrist it may help. Informed patients mother that I will send Dr. Posey Pronto the message and give her a call back.    While on the phone patients mother asked for me to check on patients Aimovig because a PA is needed. Informed her that I would check into that.

## 2021-09-10 ENCOUNTER — Other Ambulatory Visit: Payer: Self-pay

## 2021-09-10 ENCOUNTER — Telehealth (INDEPENDENT_AMBULATORY_CARE_PROVIDER_SITE_OTHER): Payer: Medicaid Other | Admitting: Psychiatry

## 2021-09-10 ENCOUNTER — Encounter (HOSPITAL_COMMUNITY): Payer: Self-pay | Admitting: Psychiatry

## 2021-09-10 DIAGNOSIS — F450 Somatization disorder: Secondary | ICD-10-CM | POA: Diagnosis not present

## 2021-09-10 DIAGNOSIS — F411 Generalized anxiety disorder: Secondary | ICD-10-CM | POA: Diagnosis not present

## 2021-09-10 DIAGNOSIS — F900 Attention-deficit hyperactivity disorder, predominantly inattentive type: Secondary | ICD-10-CM | POA: Diagnosis not present

## 2021-09-10 DIAGNOSIS — F332 Major depressive disorder, recurrent severe without psychotic features: Secondary | ICD-10-CM | POA: Diagnosis not present

## 2021-09-10 NOTE — Progress Notes (Signed)
Virtual Visit via Video Note  I connected with Michaelia Darcey on 09/10/21 at  8:40 AM EDT by a video enabled telemedicine application and verified that I am speaking with the correct person using two identifiers.  Location: Patient: home Provider: office   I discussed the limitations of evaluation and management by telemedicine and the availability of in person appointments. The patient expressed understanding and agreed to proceed.      I discussed the assessment and treatment plan with the patient. The patient was provided an opportunity to ask questions and all were answered. The patient agreed with the plan and demonstrated an understanding of the instructions.   The patient was advised to call back or seek an in-person evaluation if the symptoms worsen or if the condition fails to improve as anticipated.  I provided 25 minutes of non-face-to-face time during this encounter.   [name], MD  [facility] MD/PA/NP OP Progress Note  09/10/2021 9:05 AM Yesena Reaves  MRN:  [id number]  Chief Complaint:  Chief Complaint   Anxiety; Depression; Follow-up    HPI:  This patient is a 35 year old female who is now living alone in [place].  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  She has 2 children with whom she shares custody with their fathers.  The patient returns for follow-up after 3 days with her mother.  Her mother called very concerned because she states the patient is having episodes of confusion and "hallucinations."  However the patient cannot relate any hallucinations such as hearing voices seeing things feeling things tasting things that are not really there.  She states that sometimes she feels like she is answering questions to people who are not there but she can actually hear them talking.  She is also extremely tired and is not sleeping because of pain.  Her neurologist was contacted and did not feel any of this was neurological.  The patient also has spells of  "blanking out" and not knowing where she is or feeling disoriented and not remembering things that happen for small for spaces of time.  Again the neurologist does not think this is seizure related.  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  The patient has been stressed about issues with her children.  Right now she is staying with her mother.  She is not hallucinating at the present time and denies any odd thoughts or thoughts of self-harm.  She is alert and oriented x3 although she does appear very tired.  She never picked up the trazodone for sleep and I urged her to take this in take the Adderall during the day because it helps her focus.  She does take the clonazepam but is only on low-dose of 0.5 mg.  She takes Dilaudid and tizanidine from pain management but none of these have been increased or changed recently.  I think this is mainly a somatizations disorder and a reaction to stress.  Given that the patient is so dysfunctional right now I will refer her to our outpatient intensive program so she can gain more support. Visit Diagnosis:    ICD-10-CM   1. Major depressive disorder, recurrent episode, 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[place])  F33.2     2. GAD (generalized anxiety disorder)  F41.1     3. Attention deficit hyperactivity disorder (ADHD), predominantly inattentive type  F90.0     4. 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.0       Past Psychiatric History: Long-term outpatient treatment, hospitalization  last year for depression  Past Medical History:  Past Medical History:  Diagnosis Date   ADD (attention deficit disorder)    Anemia 2011   2o to GASTRIC BYPASS   Anginal pain ([place])    Anxiety    Asthma    Blood transfusion without reported diagnosis    Chronic daily headache    Depression    Depression    Dysmenorrhea 09/18/2013   Dysrhythmia    Elevated liver enzymes JUL 2011ALK PHOS 111-127 AST  143-267 ALT  213-321T BILI 0.6  ALB  3.7-4.06 Jun 2011 ALK PHOS 118 AST 24  ALT 42 T BILI 0.4 ALB 3.9   Encounter for drug rehabilitation    at behavioral health for opioid addiction about 4 years ago   Family history of adverse reaction to anesthesia    'dad had to be kept on pump for breathing for morphine'   Fatty liver    Fibroid 01/18/2017   Fibromyalgia    Gastric bypass status for obesity    Gastritis JULY 2011   Heart murmur    Hereditary and idiopathic peripheral neuropathy 01/15/2015   History of Holter monitoring    Hx of opioid abuse ([place])    for about 4 years, about 4 years ago   Interstitial cystitis    Iron deficiency anemia 07/23/2010   Irritable bowel syndrome 2012 DIARRHEA   JUN 2012 TTG IgA 14.9   IUD FEB 2010   Lupus ([place])    Menorrhagia 07/18/2013   Migraines    Obesity (BMI 30-39.9) 2011 228 LBS BMI 36.8   Osteoporosis    Ovarian cyst    Patient desires pregnancy 09/18/2013   Polyneuropathy    PONV (postoperative nausea and vomiting) seizure post-operatively   seizure following ankle surgery   Potassium (K) deficiency    Pregnant 12/25/2013   Psychiatric pseudoseizure    RLQ abdominal pain 07/18/2013   Sciatica of left side 11/14/2014   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   Stress 09/18/2013    Past Surgical History:  Procedure Laterality Date   BIOPSY  04/10/2015   Procedure: BIOPSY;  Surgeon: Daneil Dolin, MD;  Location: [facility];  Service: Endoscopy;;   BIOPSY  04/15/2021   Procedure: BIOPSY;  Surgeon: Montez Morita, Quillian Quince, MD;  [facility];  Service: Gastroenterology;;   cath self every nite     for sodium bicarb injection (discontinued 2013)   CHOLECYSTECTOMY  2005   biliary dyskinesia   COLONOSCOPY  JUN 2012 ABD PN/DIARRHEA WITH PROPOFOL   NL COLON   COLONOSCOPY WITH PROPOFOL N/A 01/16/2021   Procedure: COLONOSCOPY WITH PROPOFOL;  Surgeon: Harvel Quale, MD;  [facility];  Service: Gastroenterology;  Laterality: N/A;  1:45   DILATION AND CURETTAGE OF UTERUS     DILITATION &  CURRETTAGE/HYSTROSCOPY WITH NOVASURE ABLATION N/A 03/24/2017   Procedure: DILATATION & CURETTAGE/HYSTEROSCOPY WITH NOVASURE ENDOMETRIAL ABLATION;  Surgeon: Jonnie Kind, MD;  Location: [facility];  Service: Gynecology;  Laterality: N/A;   ESOPHAGEAL DILATION N/A 04/10/2015   Procedure: ESOPHAGEAL DILATION WITH 54FR MALONEY DILATOR;  Surgeon: Daneil Dolin, MD;  Location: [facility];  Service: Endoscopy;  Laterality: N/A;   ESOPHAGOGASTRODUODENOSCOPY     ESOPHAGOGASTRODUODENOSCOPY (EGD) WITH PROPOFOL N/A 04/10/2015   Procedure: ESOPHAGOGASTRODUODENOSCOPY (EGD) WITH PROPOFOL;  Surgeon: Daneil Dolin, MD;  [facility]: [facility];  Service: Endoscopy;  Laterality: N/A;   ESOPHAGOGASTRODUODENOSCOPY (EGD) WITH PROPOFOL N/A 12/06/2016   Procedure: ESOPHAGOGASTRODUODENOSCOPY (EGD)  WITH PROPOFOL;  Surgeon: Danie Binder, MD;  [facility];  Service: Endoscopy;  Laterality: N/A;   ESOPHAGOGASTRODUODENOSCOPY (EGD) WITH PROPOFOL N/A 04/27/2019   Procedure: ESOPHAGOGASTRODUODENOSCOPY (EGD) WITH PROPOFOL;  Surgeon: Rogene Houston, MD;  [facility];  Service: Endoscopy;  Laterality: N/A;  730   ESOPHAGOGASTRODUODENOSCOPY (EGD) WITH PROPOFOL N/A 08/31/2019   Procedure: ESOPHAGOGASTRODUODENOSCOPY (EGD) WITH PROPOFOL;  Surgeon: Rogene Houston, MD;  [facility];  Service: Endoscopy;  Laterality: N/A;   ESOPHAGOGASTRODUODENOSCOPY (EGD) WITH PROPOFOL N/A 01/16/2021   Procedure: ESOPHAGOGASTRODUODENOSCOPY (EGD) WITH PROPOFOL;  Surgeon: Harvel Quale, MD;  [facility];  Service: Gastroenterology;  Laterality: N/A;   ESOPHAGOGASTRODUODENOSCOPY (EGD) WITH PROPOFOL N/A 04/15/2021   Procedure: ESOPHAGOGASTRODUODENOSCOPY (EGD) WITH PROPOFOL;  Surgeon: Harvel Quale, MD;  [facility];  Service: Gastroenterology;  Laterality: N/A;  9:00 AM   GAB  2007   in [place]-POUCH 5 CM   GASTRIC BYPASS  06/2006   HYSTEROSCOPY WITH D & C N/A 09/12/2014   Procedure:  DILATATION AND CURETTAGE /HYSTEROSCOPY;  Surgeon: Jonnie Kind, MD;  Location: [facility];  Service: Gynecology;  Laterality: N/A;   KYPHOPLASTY N/A 08/02/2018   Procedure: KYPHOPLASTY T12;  Surgeon: Melina Schools, MD;  Location: [facility];  Service: Orthopedics;  Laterality: N/A;  60 mins   LAPAROSCOPIC TUBAL LIGATION Bilateral 03/24/2017   Procedure: LAPAROSCOPIC TUBAL LIGATION ([place] Rings);  Surgeon: Jonnie Kind, MD;  Location: [facility];  Service: Gynecology;  Laterality: Bilateral;   REPAIR VAGINAL CUFF N/A 07/30/2014   Procedure: REPAIR VAGINAL CUFF;  Surgeon: Mora Bellman, MD;  Location: [facility];  Service: Gynecology;  Laterality: N/A;   SAVORY DILATION  06/20/2012   Dr. Barnie Alderman gastritis/Ulcer in the mid jejunum. Empiric dilation.    SURAL NERVE BX Left 02/25/2016   Procedure: LEFT SURAL NERVE BIOPSY;  Surgeon: Jovita Gamma, MD;  Location: [facility];  Service: Neurosurgery;  Laterality: Left;  Left sural nerve biopsy   TONSILLECTOMY     TONSILLECTOMY AND ADENOIDECTOMY     UPPER GASTROINTESTINAL ENDOSCOPY  JULY 2011 NAUSEA-D125,V6, PH 25   Bx; GASTRITIS, POUCH-5 CM LONG   WISDOM TOOTH EXTRACTION      Family Psychiatric History: see below  Family History:  Family History  Problem Relation Age of Onset   Coronary artery disease Paternal Grandfather    Migraines Paternal Grandmother    Breast cancer Paternal Grandmother    Hemochromatosis Maternal Grandmother    Migraines Maternal Grandmother    Cancer Maternal Grandmother    Breast cancer Maternal Grandmother    Hypertension Father    Diabetes Father    Coronary artery disease Father    Cancer Mother        breast   Hemochromatosis Mother    Breast cancer Mother    Depression Mother    Anxiety disorder Mother    Anxiety disorder Brother    Bipolar disorder Brother    Healthy Daughter    Healthy Son     Social History:  Social History   Socioeconomic History   Marital status: Divorced    Spouse name: Not on  file   Number of children: 2   Years of education: some college   Highest education level: Some college, no degree  Occupational History   Occupation: Applying for Social Security Disability    Employer: NOT EMPLOYED  Tobacco Use   Smoking status: Former    Packs/day: 0.25  Years: 6.00    Pack years: 1.50    Types: Cigarettes    Start date: 05/2018   Smokeless tobacco: Never  Vaping Use   Vaping Use: Some days  Substance and Sexual Activity   Alcohol use: Not Currently    Alcohol/week: 0.0 standard drinks   Drug use: Not Currently   Sexual activity: Yes    Partners: Male    Birth control/protection: Surgical, Condom    Comment: tubal and ablation  Other Topics Concern   Not on file  Social History Narrative   Patient is right handed.   Patient drinks one cup of coffee daily.   She lives in a one-story home with her two children (32 year old son, 21 month old daughter).   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 [employer] Strain: [employer]   Difficulty of Paying Living Expenses: Somewhat hard  Food Insecurity: Landscape architect Present   Worried About Charity fundraiser in the Last Year: Sometimes true   Arboriculturist in the Last Year: Sometimes true  Transportation Needs: No Transportation Needs   Lack of Transportation (Medical): No   Lack of Transportation (Non-Medical): No  Physical Activity: Inactive   Days of Exercise per Week: 0 days   Minutes of Exercise per Session: 10 min  Stress: Stress Concern Present   Feeling of Stress : Very much  Social Connections: Moderately Isolated   Frequency of Communication with Friends and Family: More than three times a week   Frequency of Social Gatherings with Friends and Family: Once a week   Attends Religious Services: 1 to 4 times per year   Active Member of [employer] or Organizations: No   Attends Archivist Meetings:  Never   Marital Status: Divorced    Allergies:  Allergies  Allergen Reactions   Gabapentin Other (See Comments)    Pt states that she was unresponsive after taking this medication, but her vitals remained stable.     Metoclopramide Hcl Anxiety and Other (See Comments)    Pt states that she felt like she was trapped in a box, and could not get out.  Pt also states that she had temporary loss of movement, weakness, and tingling.     Tramadol Other (See Comments)    Seizures   Ativan [Lorazepam] Other (See Comments)    combative   Latex Itching, Rash and Other (See Comments)    Burns   Lyrica [Pregabalin] Other (See Comments)    suicidal   Iron Other (See Comments)    PATIENT IS INTOLERANT TO ORAL IRON , SHE DOES NOT ABSORB IT.     Sulfa Antibiotics Hives   Butrans [Buprenorphine] Rash    Patch caused rash, didn't help with pain   Nucynta [Tapentadol] Nausea And Vomiting   Propofol Nausea And Vomiting   Tape Hives, Itching and Rash    Please use paper tape   Toradol [Ketorolac Tromethamine] Anxiety    Metabolic Disorder Labs: Lab Results  Component Value Date   HGBA1C 5.7 (H) 01/15/2015   Lab Results  Component Value Date   PROLACTIN 80.0 (H) 05/09/2015   Lab Results  Component Value Date   CHOL 95 03/10/2012   TRIG 50 03/10/2012   HDL 38 (L) 03/10/2012   CHOLHDL 2.5 03/10/2012   VLDL 10 03/10/2012   LDLCALC 47 03/10/2012   Lab  Results  Component Value Date   TSH 1.350 01/01/2021   TSH 2.616 08/25/2019    Therapeutic Level Labs: No results found for: LITHIUM Lab Results  Component Value Date   VALPROATE 66 06/23/2020   No components found for:  CBMZ  Current Medications: Current Outpatient Medications  Medication Sig Dispense Refill   amphetamine-dextroamphetamine (ADDERALL XR) 30 MG 24 hr capsule Take 1 capsule (30 mg total) by mouth every morning. 30 capsule 0   Calcium Carb-Cholecalciferol (CALCIUM-VITAMIN D3) 600-500 MG-UNIT CAPS Take 1 tablet by  mouth daily.     Carboxymethylcellul-Glycerin (LUBRICATING EYE DROPS OP) Place 1 drop into both eyes daily as needed (dry eyes).     cetirizine (ZYRTEC) 10 MG tablet TAKE ONE TABLET BY MOUTH DAILY (Patient taking differently: Take 10 mg by mouth daily.) 30 tablet 5   clidinium-chlordiazePOXIDE (LIBRAX) 5-2.5 MG capsule Take 1 capsule by mouth 3 (three) times daily before meals. 90 capsule 5   clonazePAM (KLONOPIN) 0.5 MG tablet Take 1 tablet (0.5 mg total) by mouth 2 (two) times daily as needed. 60 tablet 2   DULoxetine (CYMBALTA) 60 MG capsule Take 1 capsule (60 mg total) by mouth 2 (two) times daily. 180 capsule 2   Erenumab-aooe (AIMOVIG) 140 MG/ML SOAJ Inject 140 mg into the skin every 28 (twenty-eight) days. 1 mL 11   fluticasone (FLOVENT HFA) 220 MCG/ACT inhaler Inhale 2 puffs into the lungs 2 (two) times daily. Do not inhale, swallow powder and drink water , do not eat food for 20 minutes after puffs 4 each 2   HYDROmorphone (DILAUDID) 4 MG tablet Take 4 mg by mouth every 4 (four) hours as needed for severe pain.     lansoprazole (PREVACID) 30 MG capsule Take 1 capsule (30 mg total) by mouth daily. 180 capsule 1   levalbuterol (XOPENEX HFA) 45 MCG/ACT inhaler INHALE (2) PUFFS INTO THE LUNGS EVERY SIX HOURS AS NEEDED FOR WHEEZING. (Patient taking differently: Inhale 2 puffs into the lungs every 6 (six) hours as needed for wheezing or shortness of breath. INHALE (2) PUFFS INTO THE LUNGS EVERY SIX HOURS AS NEEDED FOR WHEEZING.) 15 g 3   levalbuterol (XOPENEX) 1.25 MG/3ML nebulizer solution Take 1.25 mg by nebulization every 4 (four) hours as needed for wheezing. 50 mL 3   linaclotide (LINZESS) 290 MCG CAPS capsule Take 1 capsule (290 mcg total) by mouth daily before breakfast. 90 capsule 3   meclizine (ANTIVERT) 25 MG tablet Take 1 tablet (25 mg total) by mouth 3 (three) times daily as needed for dizziness. 30 tablet 0   metroNIDAZOLE (METROGEL VAGINAL) 0.75 % vaginal gel Place 1 Applicatorful  vaginally at bedtime. 70 g 0   pantoprazole (PROTONIX) 40 MG tablet TAKE 1 TABLET BY MOUTH TWICE DAILY BEFORE MEALS. 60 tablet 2   Pediatric Multivit-Minerals-C (FLINTSTONES GUMMIES) chewable tablet Chew 3 tablets by mouth daily.     promethazine (PHENERGAN) 12.5 MG tablet TAKE 1 TABLET BY MOUTH EVERY 8 HOURS AS NEEDED FOR NAUSEA OR VOMITING 30 tablet 0   promethazine (PHENERGAN) 25 MG tablet Take 25 mg by mouth 2 (two) times daily as needed for nausea or vomiting.     tiZANidine (ZANAFLEX) 4 MG tablet Take 4 mg by mouth 3 (three) times daily as needed.     traZODone (DESYREL) 100 MG tablet Take 2 tablets (200 mg total) by mouth at bedtime. 60 tablet 2   Vitamin D, Ergocalciferol, (DRISDOL) 1.25 MG (50000 UNIT) CAPS capsule Take 50,000 Units by mouth every  Wednesday.     No current facility-administered medications for this visit.     Musculoskeletal: Strength & Muscle Tone: within normal limits Gait & Station: normal Patient leans: N/A  Psychiatric Specialty Exam: Review of Systems  Constitutional:  Positive for fatigue.  Musculoskeletal:  Positive for arthralgias and back pain.  Psychiatric/Behavioral:  Positive for confusion, decreased concentration and dysphoric mood. The patient is nervous/anxious.   All other systems reviewed and are negative.  There were no vitals taken for this visit.There is no height or weight on file to calculate BMI.  General Appearance: Casual and Disheveled  Eye Contact:  Good  Speech:  Clear and Coherent  Volume:  Decreased  Mood:  Anxious and Dysphoric  Affect:  Depressed  Thought Process:  Goal Directed  Orientation:  Full (Time, Place, and Person)  Thought Content: Rumination   Suicidal Thoughts:  No  Homicidal Thoughts:  No  Memory:  Immediate;   Good Recent;   Fair Remote;   NA  Judgement:  Poor  Insight:  Shallow  Psychomotor Activity:  Decreased  Concentration:  Concentration: Poor and Attention Span: Poor  Recall:  [place] of  Knowledge: Good  Language: Good  Akathisia:  No  Handed:  Right  AIMS (if indicated): not done  Assets:  Communication Skills Desire for Improvement Resilience Social Support Talents/Skills  ADL's:  Intact  Cognition: WNL  Sleep:  Poor   Screenings: AIMS    Flowsheet Row Admission (Discharged) from 06/19/2020 in [facility]  AIMS Total Score 0      AUDIT    Flowsheet Row Patient Outreach Telephone from 07/14/2020 in [facility] Coordination Admission (Discharged) from 06/19/2020 in [facility] ED to Hosp-Admission (Discharged) from 05/09/2015 in [facility]  Alcohol Use Disorder Identification Test Final Score (AUDIT) 1 1 0      GAD-7    Flowsheet Row Office Visit from 08/25/2021 in [facility] from 12/11/2018 in [facility] from 02/07/2017 in [facility]  Total GAD-7 Score $RemoveBef'13 19 17      'UzWyrIFjSM$ PHQ2-9    Flowsheet Row Video Visit from 09/07/2021 in [facility] Office Visit from 08/25/2021 in [facility] Video Visit from 07/13/2021 in [facility] Video Visit from 05/12/2021 in [facility] Video Visit from 03/12/2021 in [facility]  PHQ-2 Total Score $RemoveBef'4 3 4 1 'yCMRoDaCBg$ 0  PHQ-9 Total Score $RemoveBef'13 13 5 'IpeNUFtXRZ$ -- --      Flowsheet Row Video Visit from 09/07/2021 in [facility] ED from 08/31/2021 in [facility] ED from 08/09/2021 in [facility] No Risk No Risk No Risk        Assessment and Plan: This patient is a 35 year old female with a history of depression anxiety ADHD borderline personality  disorder insomnia pseudoseizures.  She is now having these odd's falling out spells and lapses in memory as well as what she perceives as hallucinations.  They do not meet the criteria for such in my opinion.  Since lack of sleep is precipitating this I urged her to pick up the trazodone and at least take 100 mg at bedtime.  She will continue Cymbalta 120 mg daily for depression Adderall XR 30 mg every morning for focus  and clonazepam 0.5 mg twice daily only as needed for anxiety.  She will be referred to our intensive outpatient program and return to see me in 2 weeks   [name], MD 09/10/2021, 9:05 AM

## 2021-09-10 NOTE — Telephone Encounter (Signed)
I will routine Epic message.

## 2021-09-10 NOTE — Telephone Encounter (Signed)
I saw her today with mom and she looked tired and disheveled but alert and oriented, answered questions appropriately. She does not endorse what I would consider hallucinations. These seem more like dissociative episodes, maybe due to anxiety. I tried to refer her to intensive outpatient treatment but they don't take her insurance. We will try to stumble along with meds and therapy. She is not sleeping so trazodone restarted. Mom reports med non-compliance so I can't comment on med side effects if she doesn't take them as prescribed.  Appreciate everyone's input on this challenging case Deb

## 2021-09-14 ENCOUNTER — Telehealth (HOSPITAL_COMMUNITY): Payer: Medicaid Other | Admitting: Psychiatry

## 2021-09-15 ENCOUNTER — Telehealth: Payer: Self-pay

## 2021-09-15 ENCOUNTER — Telehealth (HOSPITAL_COMMUNITY): Payer: Self-pay | Admitting: Clinical

## 2021-09-15 ENCOUNTER — Other Ambulatory Visit: Payer: Self-pay

## 2021-09-15 ENCOUNTER — Ambulatory Visit (HOSPITAL_COMMUNITY): Payer: Medicaid Other | Admitting: Clinical

## 2021-09-15 NOTE — Telephone Encounter (Signed)
F/u   Penny [name] (Key: [id number]) -- the patient is aware of approval medication  Aimovig 140MG /ML auto-injectors   Form [employer] [facility] Electronic [place] Form [phone] NCPDP) Created 11 minutes ago Sent to Plan 2 minutes ago Plan Response 1 minute ago Submit Clinical Questions less than a minute ago Determination Favorable less than a minute ago Message from Plan PA Case: [id number], Status: Approved, Coverage Starts on: 09/15/2021 12:00:00 AM, Coverage Ends on: 09/15/2022 12:00:00 AM.

## 2021-09-15 NOTE — Telephone Encounter (Signed)
New message    The patient called in regarding prior authorization on Aimovig 140 mg.  8/30, 9/19, 9/23 denial.   Resubmit renewal today per patient request.   The patient is asking for another medication to be called in due to daily migraines and being in bed.

## 2021-09-15 NOTE — Telephone Encounter (Signed)
The patient did not respond to video link, phone call, or VM . Pt missed this scheduled appointment

## 2021-09-16 NOTE — Telephone Encounter (Signed)
Please let her know that she needs to try higher dose of Aimovig which was approved and see if that provides relief.

## 2021-09-17 NOTE — Telephone Encounter (Signed)
Called patient and left a message for a call back.  

## 2021-09-18 ENCOUNTER — Telehealth: Payer: Self-pay

## 2021-09-18 IMAGING — CT CT L SPINE W/O CM
4 series · 14 of 33 positions shown, 16 images · non-contrast
Comparison: Lumbar radiographs 03/15/2021

CLINICAL DATA: Low back pain, progressive neurologic deficit.
Remote history of thoracolumbar spine fracture.

EXAM:
CT LUMBAR SPINE WITHOUT CONTRAST
TECHNIQUE: Multidetector CT imaging of the lumbar spine was performed without
intravenous contrast administration. Multiplanar CT image
reconstructions were also generated.

[Series 4: l spine soft · axial · 0.29mm/px · z∈[+849,+885]mm · 2 of 127 slices shown]
[im 19/127  soft-tissue]
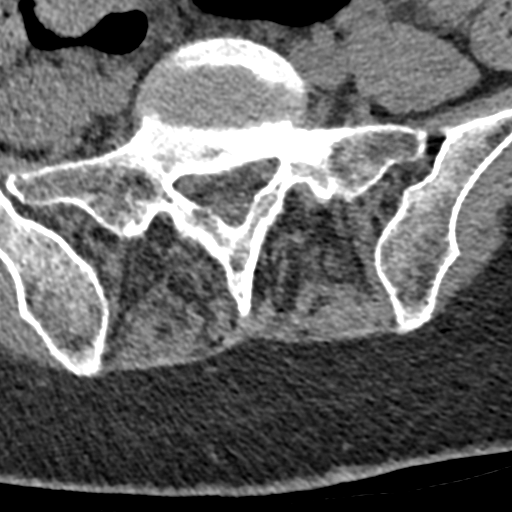
[im 37/127  soft-tissue]
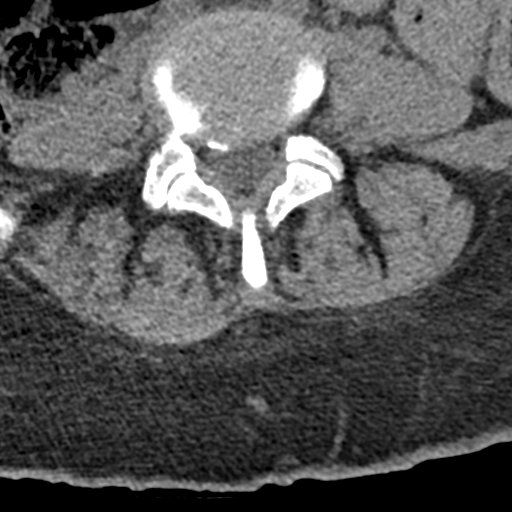

[Series 5: sagittal bone · sagittal · 0.37mm/px · 5 of 61 slices shown, 6 images]
[im 21/61  bone]
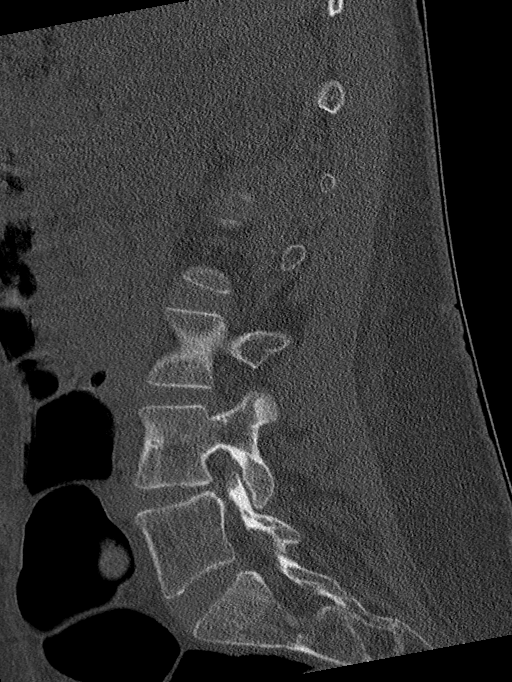
[im 26/61  bone]
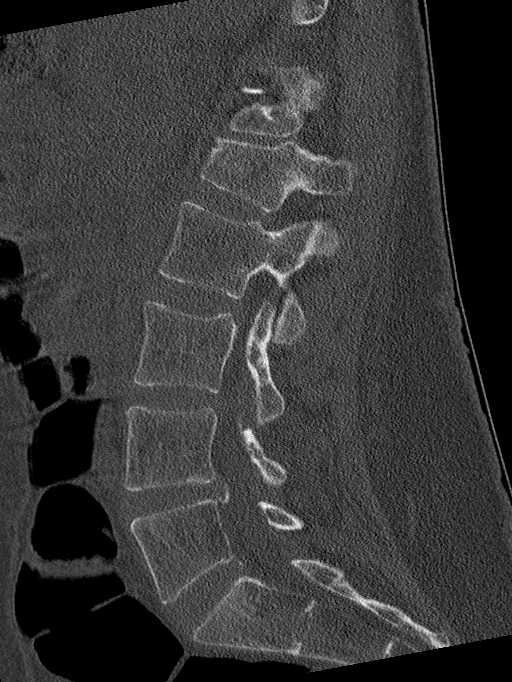
[im 31/61  soft-tissue]
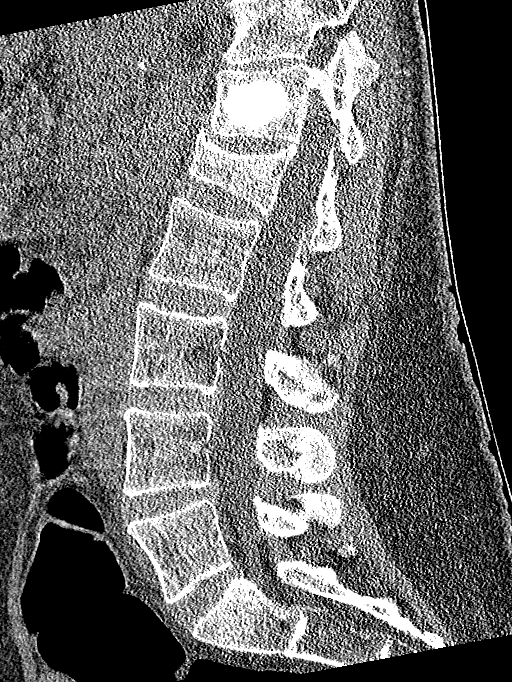
[im 31/61  bone]
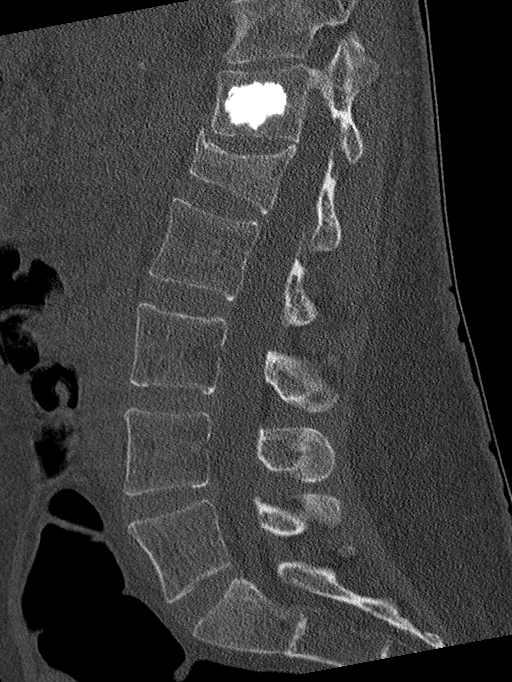
[im 36/61  bone]
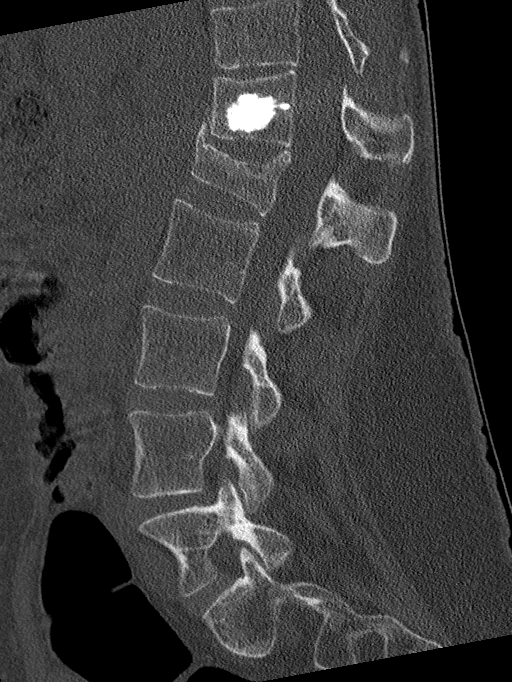
[im 41/61  bone]
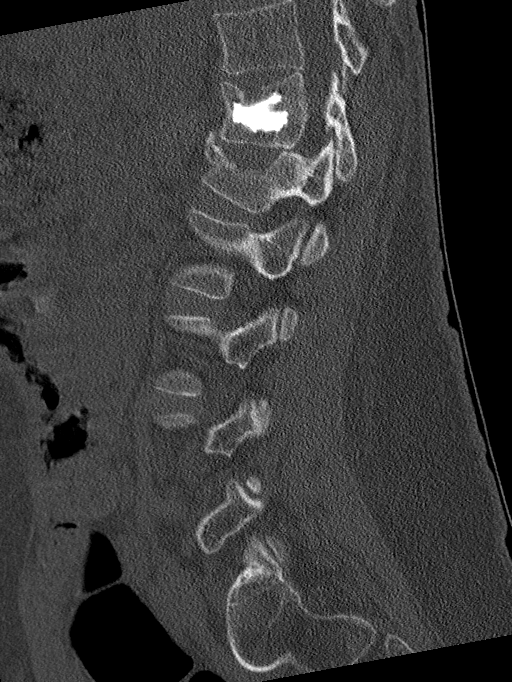

[Series 6: coronal bone · coronal · 0.37mm/px · 3 of 61 slices shown]
[im 13/61  bone]
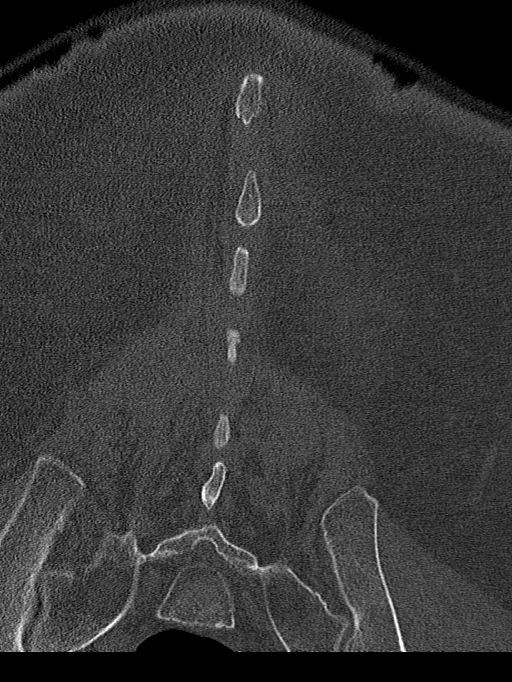
[im 25/61  bone]
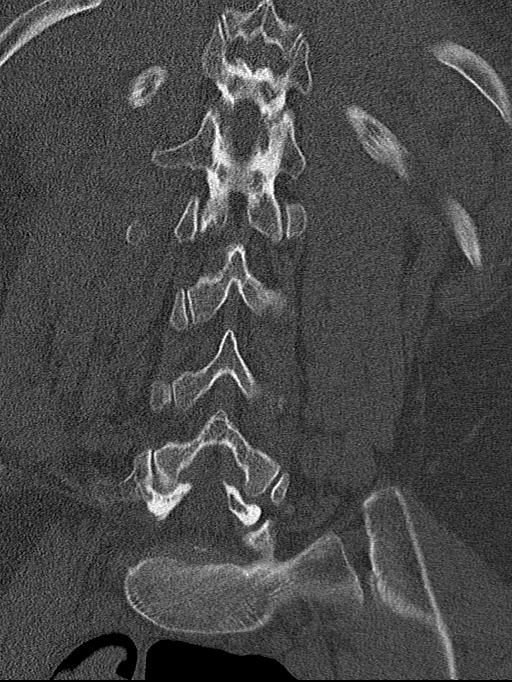
[im 37/61  bone]
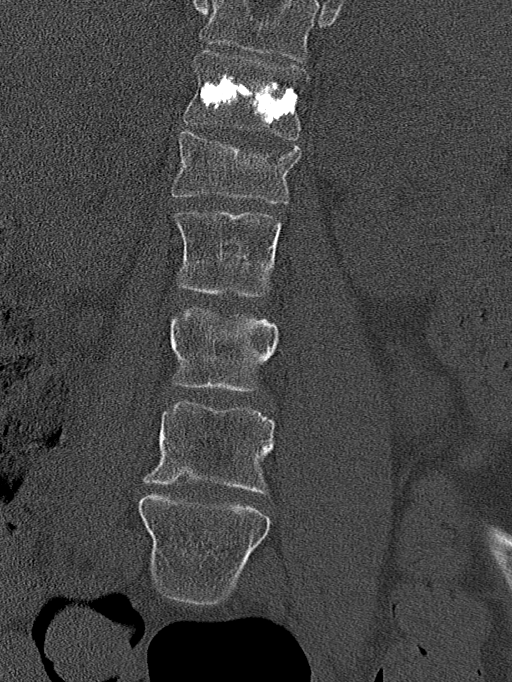

[Series 7: ax disc · axial · 0.21mm/px · z∈[+828,+946]mm · 4 of 85 slices shown, 5 images]
[im 17/85  soft-tissue]
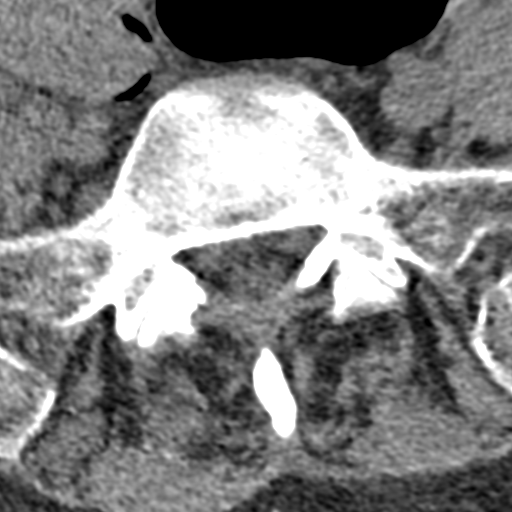
[im 17/85  bone]
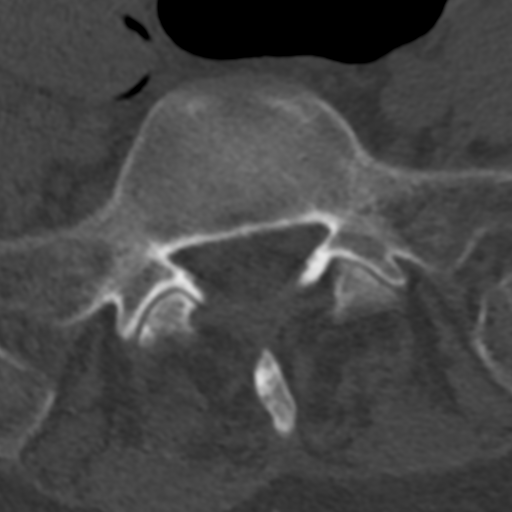
[im 34/85  bone]
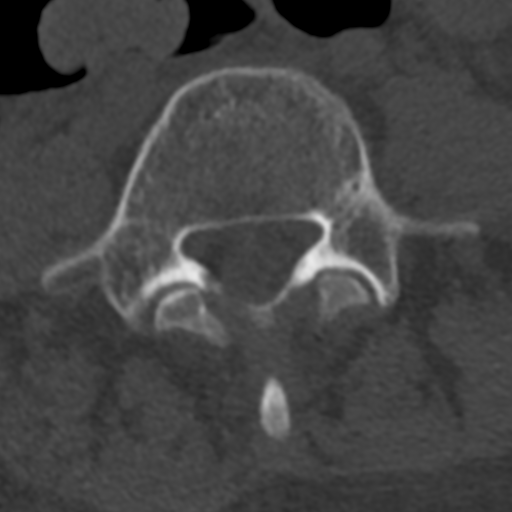
[im 51/85  bone]
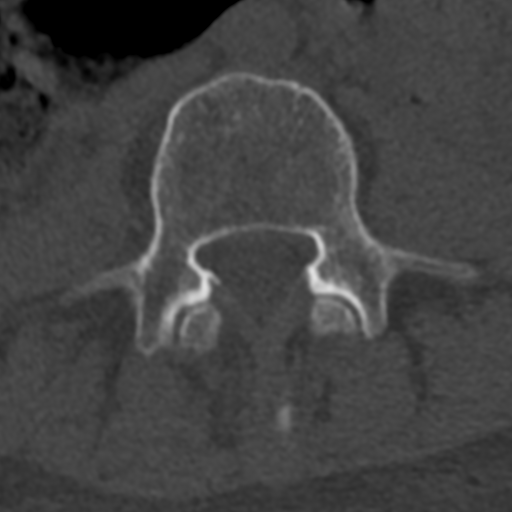
[im 68/85  bone]
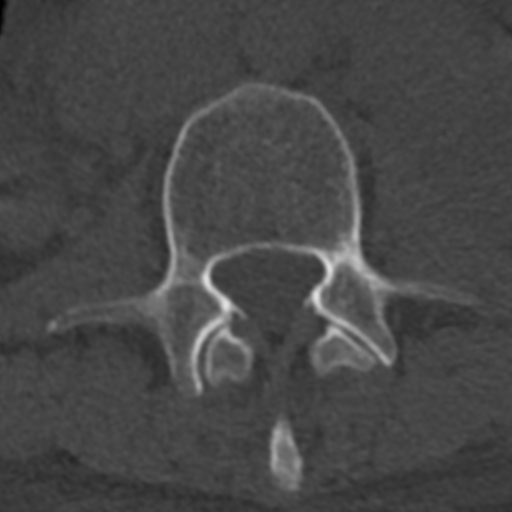

[14 of 33 positions shown; findings below may reference images not displayed]

FINDINGS: Segmentation: 5 lumbar type vertebrae.

Alignment: Normal lumbar lordosis. Mild focal kyphosis at T12-L1
secondary to L1 compression deformity, stable since prior
examination

Vertebrae: Mild compression deformity of T12 with changes of
vertebral augmentation are again identified. Chronic compression
deformity of L1 is again identified with approximately 50% loss of
height, unchanged. No retropulsion. Remaining vertebral body height
is preserved. No acute fracture or listhesis. No lytic or blastic
bone lesion identified.

Paraspinal and other soft tissues: Negative.

Disc levels: Review of the axial images demonstrates:

T12-L1: Mild focal kyphosis. Mild degenerative disc disease with
intervertebral disc space narrowing. No significant neuroforaminal
narrowing. No significant canal stenosis.

L1-2: Disc space preserved. No significant neuroforaminal narrowing.
No canal stenosis.

L2-3: Disc space preserved. No significant neuroforaminal narrowing.
No canal stenosis.

L3-4: Mild broad-based disc bulge. No significant neuroforaminal
narrowing. No significant canal stenosis. No significant facet
arthrosis.

L4-5: Moderate posterior broad-based disc osteophyte complex abuts
and mildly remodels the thecal sac. Mild hypertrophy of the lamina
propria. Minimal central canal stenosis. No significant
neuroforaminal narrowing.

L5-S1: Disc space preserved. No significant canal stenosis. No
neuroforaminal narrowing.
IMPRESSION: No acute fracture or listhesis of the lumbar spine. Stable changes
of T12 vertebroplasty and L1 compression fracture with approximately
50% loss of height and mild resultant focal kyphosis at T12-L1.

## 2021-09-18 IMAGING — CT CT HEAD W/O CM
3 series · 15 of 47 positions shown, 18 images · non-contrast
Comparison: None.

CLINICAL DATA: Neuro deficit, acute, stroke suspected.  Memory loss

EXAM:
CT HEAD WITHOUT CONTRAST
TECHNIQUE: Contiguous axial images were obtained from the base of the skull
through the vertex without intravenous contrast.

[Series 2: head w o · axial · 0.43mm/px · z∈[+1407,+1532]mm · 9 of 30 slices shown, 12 images]
[im 3/30  brain]
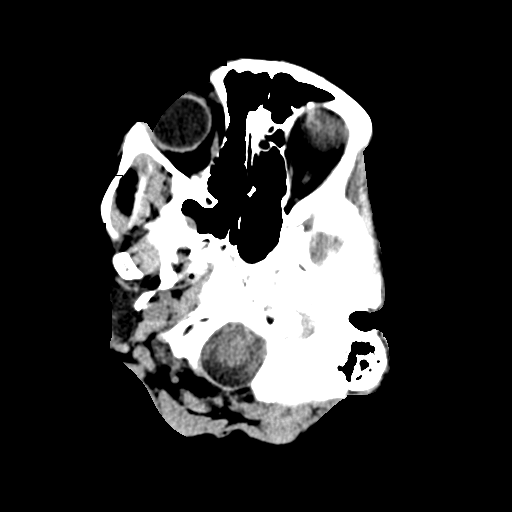
[im 3/30  bone]
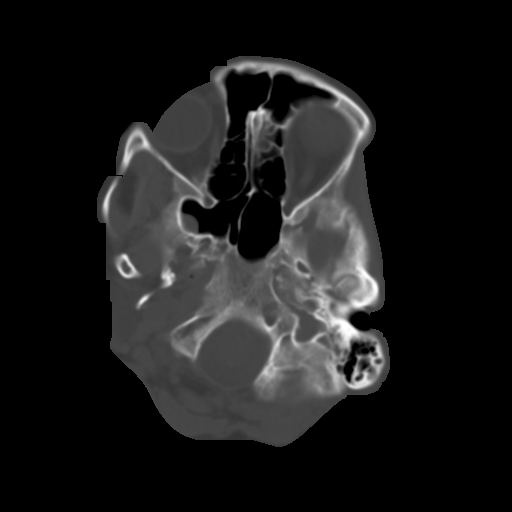
[im 6/30  brain]
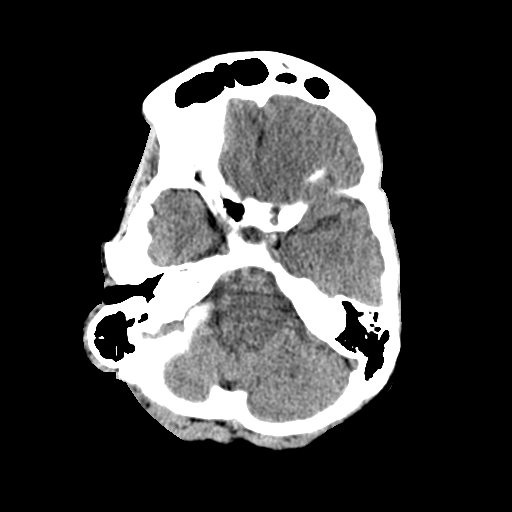
[im 9/30  brain]
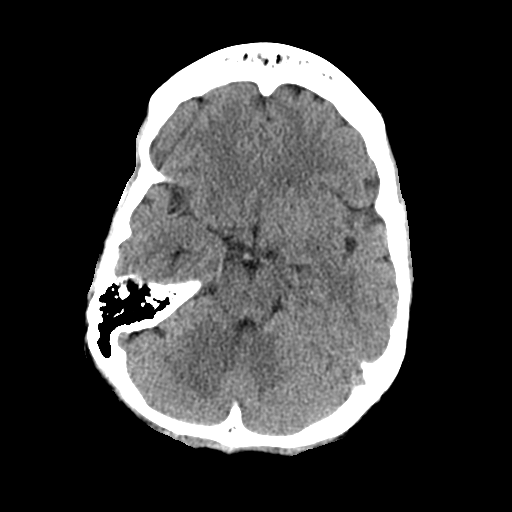
[im 12/30  brain]
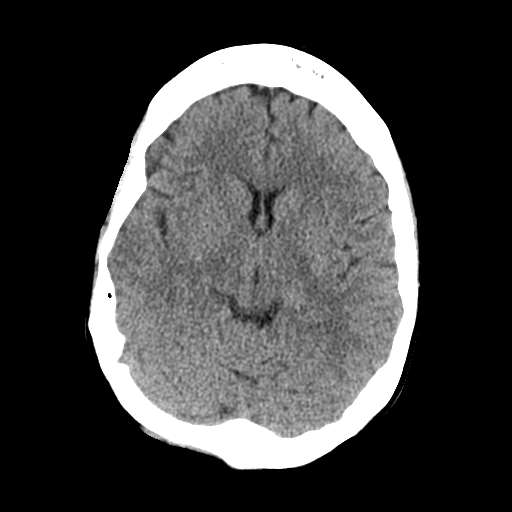
[im 16/30  brain]
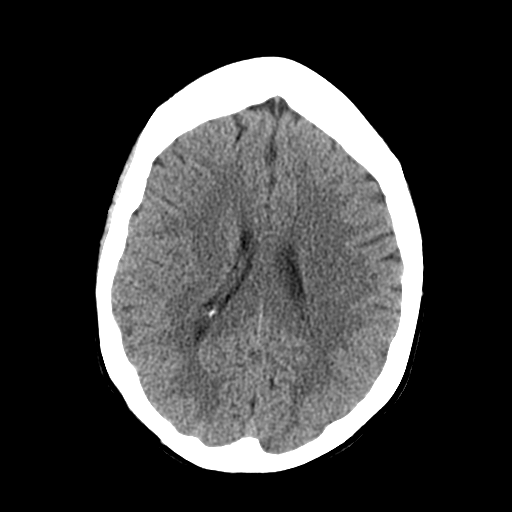
[im 16/30  bone]
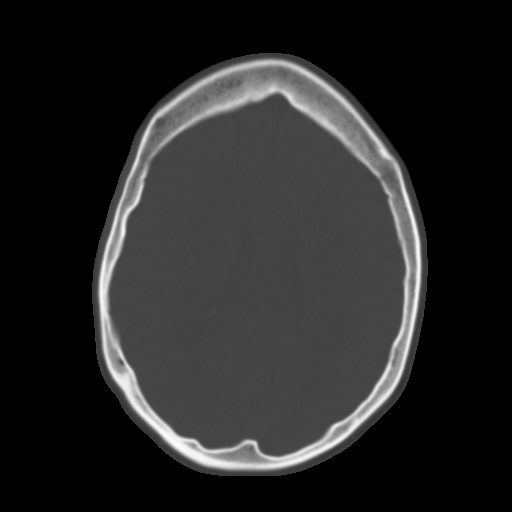
[im 19/30  brain]
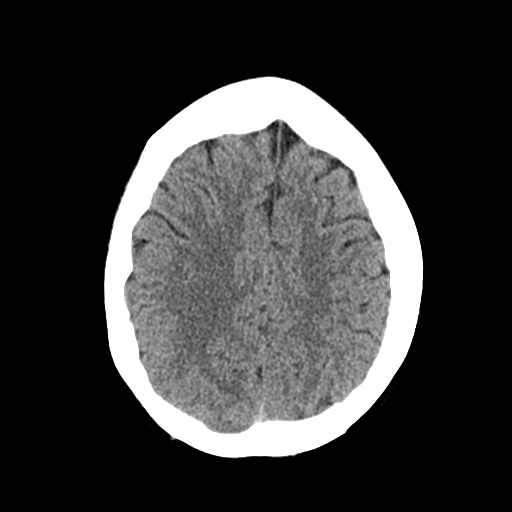
[im 22/30  brain]
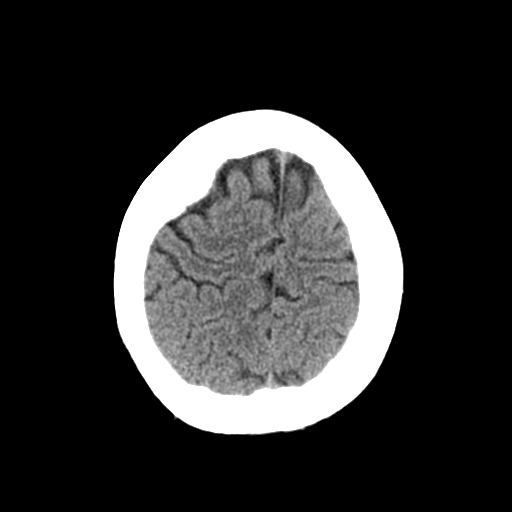
[im 25/30  brain]
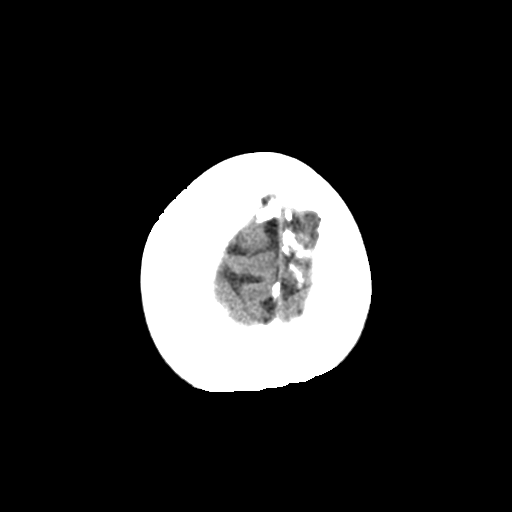
[im 28/30  brain]
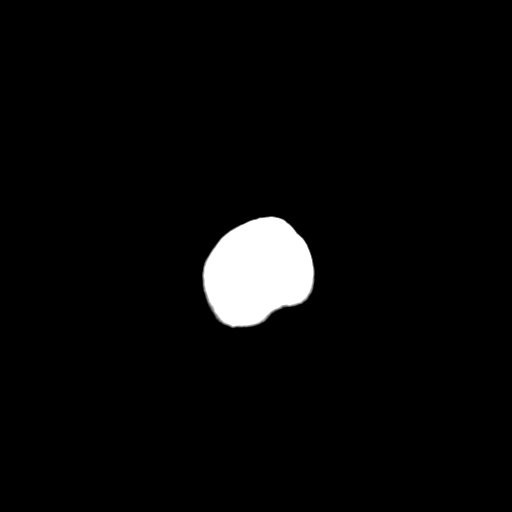
[im 28/30  bone]
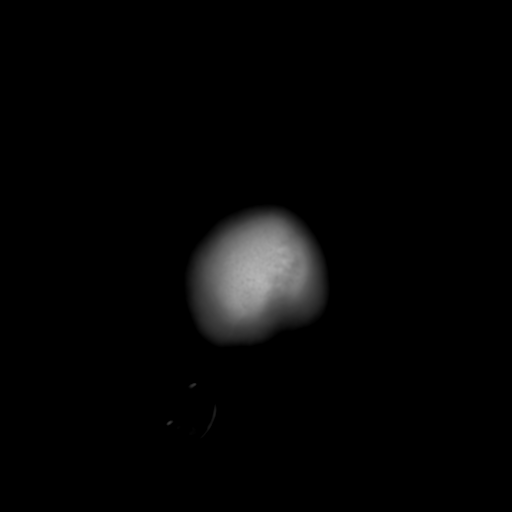

[Series 4: coronal soft · coronal · 0.30mm/px · 3 of 65 slices shown]
[im 22/65  brain]
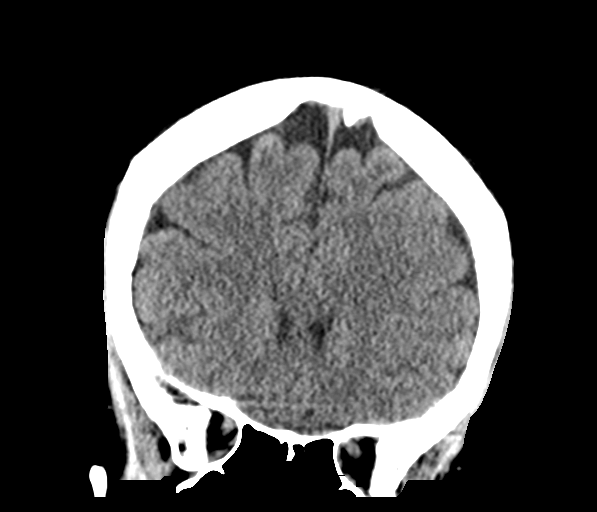
[im 29/65  brain]
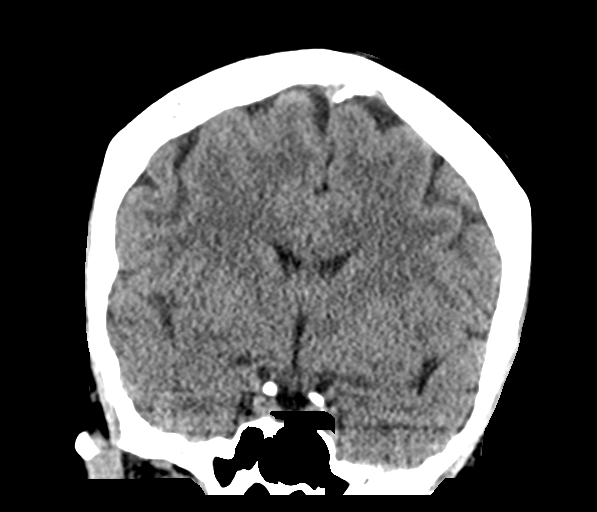
[im 36/65  brain]
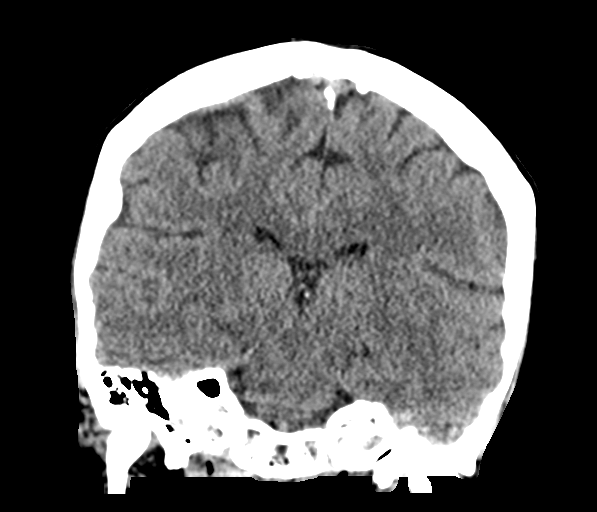

[Series 5: sagittal soft · sagittal · 0.29mm/px · 3 of 52 slices shown]
[im 18/52  brain]
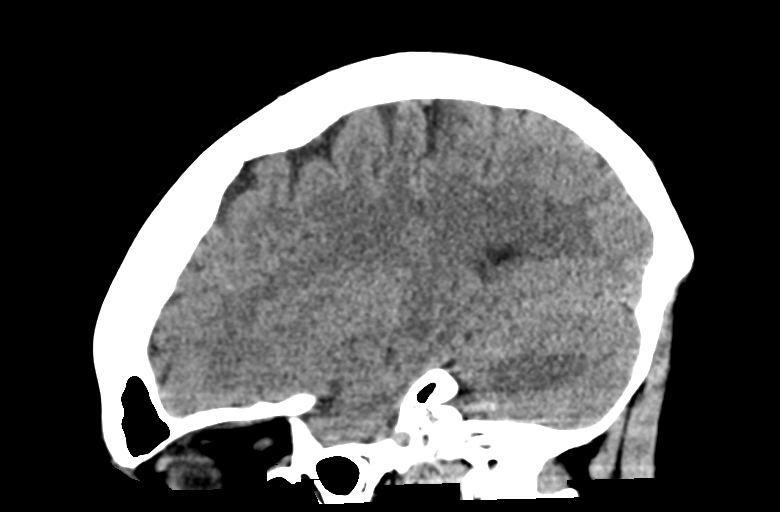
[im 26/52  brain]
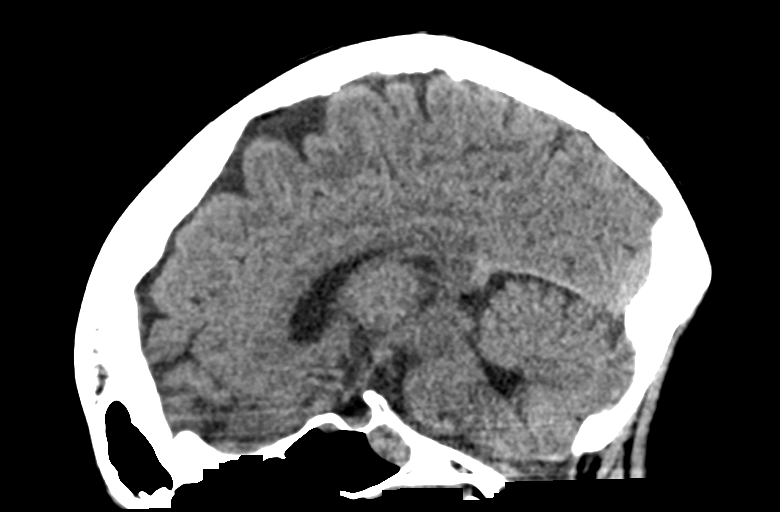
[im 35/52  brain]
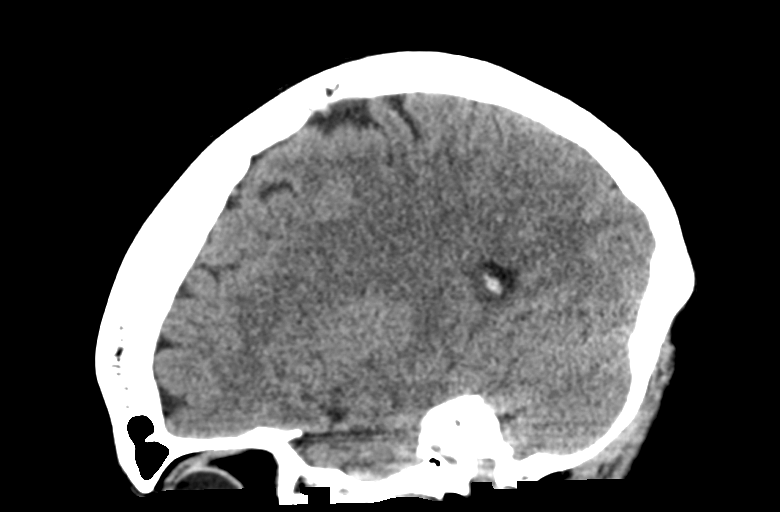

[15 of 47 positions shown; findings below may reference images not displayed]

FINDINGS: Brain: Normal anatomic configuration. No abnormal intra or
extra-axial mass lesion or fluid collection. No abnormal mass effect
or midline shift. No evidence of acute intracranial hemorrhage or
infarct. Ventricular size is normal. Cerebellum unremarkable.

Vascular: Unremarkable

Skull: Intact

Sinuses/Orbits: Mucous retention cysts are seen within the paranasal
sinuses bilaterally. Remaining paranasal sinuses are clear. Orbits
are unremarkable.

Other: Mastoid air cells and middle ear cavities are clear.
IMPRESSION: No acute intracranial abnormality.

Mild sphenoid sinus disease.

## 2021-09-18 MED ORDER — NURTEC 75 MG PO TBDP: ORAL_TABLET | ORAL | 3 refills | Status: DC

## 2021-09-18 NOTE — Telephone Encounter (Signed)
I will send Rx for Nurtec 75mg  - take 1 tablet for severe migraine.  Limit to twice per week.

## 2021-09-18 NOTE — Telephone Encounter (Signed)
Called patient and informed her that Dr. Posey Pronto would like patient to try higher dose Aimovig that was just approved to see if that provides relief. Patient stated that she took first dose of the Aimovig and it did not help and she has a terrible migraine. Spoke to Dr. Posey Pronto who said we can send patient a Rx of Nurtec to see if that will provide relief. Informed patient of Nurtec and that we sent it to the pharmacy and patient thanked [name] for our help.   Patient had no further questions or concerns.

## 2021-09-18 NOTE — Addendum Note (Signed)
Addended by: Alda Berthold on: 09/18/2021 11:25 AM   Modules accepted: Orders

## 2021-09-18 NOTE — Telephone Encounter (Signed)
New message  Penny [name] (Key: [id number]) Rx #: [id number] Nurtec 75MG  dispersible tablets   Form [employer] [facility] Electronic [place] Form [phone] NCPDP) Created 3 hours ago Sent to Plan 9 minutes ago Plan Response 9 minutes ago Submit Clinical Questions less than a minute ago Determination Favorable less than a minute ago Message from Plan PA Case: [id number], Status: Approved, Coverage Starts on: 09/18/2021 12:00:00 AM, Coverage Ends on: 09/18/2022 12:00:00 AM.

## 2021-09-19 DIAGNOSIS — F449 Dissociative and conversion disorder, unspecified: Secondary | ICD-10-CM | POA: Diagnosis not present

## 2021-09-19 DIAGNOSIS — Z79899 Other long term (current) drug therapy: Secondary | ICD-10-CM | POA: Diagnosis not present

## 2021-09-19 DIAGNOSIS — M546 Pain in thoracic spine: Secondary | ICD-10-CM | POA: Diagnosis not present

## 2021-09-23 ENCOUNTER — Telehealth (HOSPITAL_COMMUNITY): Payer: Self-pay | Admitting: *Deleted

## 2021-09-23 DIAGNOSIS — Z79899 Other long term (current) drug therapy: Secondary | ICD-10-CM | POA: Diagnosis not present

## 2021-09-23 NOTE — Telephone Encounter (Signed)
Tell her IOP cannot take her medicaid. They have referred her to another program at [facility]. I've asked them to follow up

## 2021-09-23 NOTE — Telephone Encounter (Signed)
Spoke with patient and informed her with what provider stated and she verbalized understanding.

## 2021-09-23 NOTE — Telephone Encounter (Signed)
Patient called and [facility] stating she have not received a call from the people Dr. Harrington Challenger referred her to. Per pt it was for intensive therapy not regular therapy.

## 2021-09-24 ENCOUNTER — Other Ambulatory Visit: Payer: Self-pay

## 2021-09-24 ENCOUNTER — Encounter (HOSPITAL_COMMUNITY): Payer: Self-pay | Admitting: Psychiatry

## 2021-09-24 ENCOUNTER — Encounter (INDEPENDENT_AMBULATORY_CARE_PROVIDER_SITE_OTHER): Payer: Self-pay | Admitting: Gastroenterology

## 2021-09-24 ENCOUNTER — Ambulatory Visit (INDEPENDENT_AMBULATORY_CARE_PROVIDER_SITE_OTHER): Payer: Medicaid Other | Admitting: Gastroenterology

## 2021-09-24 ENCOUNTER — Telehealth (INDEPENDENT_AMBULATORY_CARE_PROVIDER_SITE_OTHER): Payer: Medicaid Other | Admitting: Psychiatry

## 2021-09-24 ENCOUNTER — Ambulatory Visit: Payer: Medicaid Other | Admitting: Neurology

## 2021-09-24 DIAGNOSIS — F450 Somatization disorder: Secondary | ICD-10-CM | POA: Diagnosis not present

## 2021-09-24 DIAGNOSIS — F411 Generalized anxiety disorder: Secondary | ICD-10-CM

## 2021-09-24 DIAGNOSIS — F900 Attention-deficit hyperactivity disorder, predominantly inattentive type: Secondary | ICD-10-CM | POA: Diagnosis not present

## 2021-09-24 DIAGNOSIS — F332 Major depressive disorder, recurrent severe without psychotic features: Secondary | ICD-10-CM | POA: Diagnosis not present

## 2021-09-24 MED ORDER — CLONAZEPAM 0.5 MG PO TABS: 0.5000 mg | ORAL_TABLET | Freq: Two times a day (BID) | ORAL | 2 refills | Status: DC | PRN

## 2021-09-24 MED ORDER — DULOXETINE HCL 60 MG PO CPEP: 60.0000 mg | ORAL_CAPSULE | Freq: Two times a day (BID) | ORAL | 2 refills | Status: DC

## 2021-09-24 MED ORDER — BELSOMRA 20 MG PO TABS: 20.0000 mg | ORAL_TABLET | Freq: Every day | ORAL | 2 refills | Status: DC

## 2021-09-24 MED ORDER — AMPHETAMINE-DEXTROAMPHET ER 30 MG PO CP24: 30.0000 mg | ORAL_CAPSULE | ORAL | 0 refills | Status: DC

## 2021-09-24 NOTE — Progress Notes (Signed)
Virtual Visit via Video Note  I connected with Penny [name] on 09/24/21 at  1:00 PM EDT by a video enabled telemedicine application and verified that I am speaking with the correct person using two identifiers.  Location: Patient: home Provider: office   I discussed the limitations of evaluation and management by telemedicine and the availability of in person appointments. The patient expressed understanding and agreed to proceed.    I discussed the assessment and treatment plan with the patient. The patient was provided an opportunity to ask questions and all were answered. The patient agreed with the plan and demonstrated an understanding of the instructions.   The patient was advised to call back or seek an in-person evaluation if the symptoms worsen or if the condition fails to improve as anticipated.  I provided 15 minutes of non-face-to-face time during this encounter.   Diannia Ruder, MD  [facility] MD/PA/NP OP Progress Note  09/24/2021 2:00 PM Penny [name]  MRN:  [id number]  Chief Complaint:  Chief Complaint   Anxiety; Depression; Follow-up    HPI: This patient is a 35 year old female who is now living alone in [place].  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  She has 2 children with whom she shares custody with their fathers.  The patient returns for follow-up after 2 weeks.  Last time her mother called very concerned because she was having episodes of confusion.  She had been staying with her mother for a few days.  She states she is now back at home in the episodes have diminished considerably.  She denies any current hallucinations.  She states primarily she cannot sleep and is very anxious and worried about her children.  Her younger daughter who is 7 has significant learning problems and she is trying to get her tested.  Her son who is 71 has ADHD and probably depression.  Her son's father and his wife are currently separating and her son is taking it very  hard.  She states that her son will not talk to her about anything and this makes her very sad.  The trazodone has helped her sleep and Ambien and Seroquel made her do "weird things while I was half awake."  I told her we could try Belsomra which tends to have like side effects.  She is also seeing a chiropractor which seems to be helping with her headache and back pain.  We try to get her into intensive outpatient treatment but her insurance will not cover it.  She will therefore agree to go back to seeing therapist Suzan Garibaldi.  Right now she denies any thoughts of self-harm or suicide and she was fairly pleasant and cooperative today. Visit Diagnosis:    ICD-10-CM   1. Major depressive disorder, recurrent episode, 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HCC)  F33.2     2. GAD (generalized anxiety disorder)  F41.1     3. Attention deficit hyperactivity disorder (ADHD), predominantly inattentive type  F90.0     4. 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.0       Past Psychiatric History: Long-term outpatient treatment, hospitalization last year for depression  Past Medical History:  Past Medical History:  Diagnosis Date   ADD (attention deficit disorder)    Anemia 2011   2o to GASTRIC BYPASS   Anginal pain (HCC)    Anxiety    Asthma    Blood transfusion without reported diagnosis    Chronic daily headache    Depression  Depression    Dysmenorrhea 09/18/2013   Dysrhythmia    Elevated liver enzymes JUL 2011ALK PHOS 111-127 AST  143-267 ALT  213-321T BILI 0.6  ALB  3.7-4.06 Jun 2011 ALK PHOS 118 AST 24 ALT 42 T BILI 0.4 ALB 3.9   Encounter for drug rehabilitation    at behavioral health for opioid addiction about 4 years ago   Family history of adverse reaction to anesthesia    'dad had to be kept on pump for breathing for morphine'   Fatty liver    Fibroid 01/18/2017   Fibromyalgia    Gastric bypass status for obesity    Gastritis JULY 2011   Heart murmur    Hereditary and idiopathic  peripheral neuropathy 01/15/2015   History of Holter monitoring    Hx of opioid abuse ([place])    for about 4 years, about 4 years ago   Interstitial cystitis    Iron deficiency anemia 07/23/2010   Irritable bowel syndrome 2012 DIARRHEA   JUN 2012 TTG IgA 14.9   IUD FEB 2010   Lupus ([place])    Menorrhagia 07/18/2013   Migraines    Obesity (BMI 30-39.9) 2011 228 LBS BMI 36.8   Osteoporosis    Ovarian cyst    Patient desires pregnancy 09/18/2013   Polyneuropathy    PONV (postoperative nausea and vomiting) seizure post-operatively   seizure following ankle surgery   Potassium (K) deficiency    Pregnant 12/25/2013   Psychiatric pseudoseizure    RLQ abdominal pain 07/18/2013   Sciatica of left side 11/14/2014   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   Stress 09/18/2013    Past Surgical History:  Procedure Laterality Date   BIOPSY  04/10/2015   Procedure: BIOPSY;  Surgeon: Daneil Dolin, MD;  Location: [facility];  Service: Endoscopy;;   BIOPSY  04/15/2021   Procedure: BIOPSY;  Surgeon: Montez Morita, Quillian Quince, MD;  [facility];  Service: Gastroenterology;;   cath self every nite     for sodium bicarb injection (discontinued 2013)   CHOLECYSTECTOMY  2005   biliary dyskinesia   COLONOSCOPY  JUN 2012 ABD PN/DIARRHEA WITH PROPOFOL   NL COLON   COLONOSCOPY WITH PROPOFOL N/A 01/16/2021   Procedure: COLONOSCOPY WITH PROPOFOL;  Surgeon: Harvel Quale, MD;  [facility];  Service: Gastroenterology;  Laterality: N/A;  1:45   DILATION AND CURETTAGE OF UTERUS     DILITATION & CURRETTAGE/HYSTROSCOPY WITH NOVASURE ABLATION N/A 03/24/2017   Procedure: DILATATION & CURETTAGE/HYSTEROSCOPY WITH NOVASURE ENDOMETRIAL ABLATION;  Surgeon: Jonnie Kind, MD;  Location: [facility];  Service: Gynecology;  Laterality: N/A;   ESOPHAGEAL DILATION N/A 04/10/2015   Procedure: ESOPHAGEAL DILATION WITH 54FR MALONEY DILATOR;  Surgeon: Daneil Dolin, MD;  Location: [facility];  Service:  Endoscopy;  Laterality: N/A;   ESOPHAGOGASTRODUODENOSCOPY     ESOPHAGOGASTRODUODENOSCOPY (EGD) WITH PROPOFOL N/A 04/10/2015   Procedure: ESOPHAGOGASTRODUODENOSCOPY (EGD) WITH PROPOFOL;  Surgeon: Daneil Dolin, MD;  [facility];  Service: Endoscopy;  Laterality: N/A;   ESOPHAGOGASTRODUODENOSCOPY (EGD) WITH PROPOFOL N/A 12/06/2016   Procedure: ESOPHAGOGASTRODUODENOSCOPY (EGD) WITH PROPOFOL;  Surgeon: Danie Binder, MD;  [facility];  Service: Endoscopy;  Laterality: N/A;   ESOPHAGOGASTRODUODENOSCOPY (EGD) WITH PROPOFOL N/A 04/27/2019   Procedure: ESOPHAGOGASTRODUODENOSCOPY (EGD) WITH PROPOFOL;  Surgeon: Rogene Houston, MD;  [facility];  Service: Endoscopy;  Laterality: N/A;  730   ESOPHAGOGASTRODUODENOSCOPY (EGD) WITH PROPOFOL N/A 08/31/2019   Procedure: ESOPHAGOGASTRODUODENOSCOPY (EGD)  WITH PROPOFOL;  Surgeon: Rogene Houston, MD;  [facility];  Service: Endoscopy;  Laterality: N/A;   ESOPHAGOGASTRODUODENOSCOPY (EGD) WITH PROPOFOL N/A 01/16/2021   Procedure: ESOPHAGOGASTRODUODENOSCOPY (EGD) WITH PROPOFOL;  Surgeon: Harvel Quale, MD;  [facility];  Service: Gastroenterology;  Laterality: N/A;   ESOPHAGOGASTRODUODENOSCOPY (EGD) WITH PROPOFOL N/A 04/15/2021   Procedure: ESOPHAGOGASTRODUODENOSCOPY (EGD) WITH PROPOFOL;  Surgeon: Harvel Quale, MD;  [facility];  Service: Gastroenterology;  Laterality: N/A;  9:00 AM   GAB  2007   in [place]-POUCH 5 CM   GASTRIC BYPASS  06/2006   HYSTEROSCOPY WITH D & C N/A 09/12/2014   Procedure: DILATATION AND CURETTAGE /HYSTEROSCOPY;  Surgeon: Jonnie Kind, MD;  Location: [facility];  Service: Gynecology;  Laterality: N/A;   KYPHOPLASTY N/A 08/02/2018   Procedure: KYPHOPLASTY T12;  Surgeon: Melina Schools, MD;  Location: [place];  Service: Orthopedics;  Laterality: N/A;  60 mins   LAPAROSCOPIC TUBAL LIGATION Bilateral 03/24/2017   Procedure: LAPAROSCOPIC TUBAL LIGATION ([place]);  Surgeon: Jonnie Kind, MD;  Location: [facility];  Service: Gynecology;  Laterality: Bilateral;   REPAIR VAGINAL CUFF N/A 07/30/2014   Procedure: REPAIR VAGINAL CUFF;  Surgeon: Mora Bellman, MD;  Location: [facility];  Service: Gynecology;  Laterality: N/A;   SAVORY DILATION  06/20/2012   Dr. Barnie Alderman gastritis/Ulcer in the mid jejunum. Empiric dilation.    SURAL NERVE BX Left 02/25/2016   Procedure: LEFT SURAL NERVE BIOPSY;  Surgeon: Jovita Gamma, MD;  Location: [place] [facility];  Service: Neurosurgery;  Laterality: Left;  Left sural nerve biopsy   TONSILLECTOMY     TONSILLECTOMY AND ADENOIDECTOMY     UPPER GASTROINTESTINAL ENDOSCOPY  JULY 2011 NAUSEA-D125,V6, PH 25   Bx; GASTRITIS, POUCH-5 CM LONG   WISDOM TOOTH EXTRACTION      Family Psychiatric History: see below  Family History:  Family History  Problem Relation Age of Onset   Coronary artery disease Paternal Grandfather    Migraines Paternal Grandmother    Breast cancer Paternal Grandmother    Hemochromatosis Maternal Grandmother    Migraines Maternal Grandmother    Cancer Maternal Grandmother    Breast cancer Maternal Grandmother    Hypertension Father    Diabetes Father    Coronary artery disease Father    Cancer Mother        breast   Hemochromatosis Mother    Breast cancer Mother    Depression Mother    Anxiety disorder Mother    Anxiety disorder Brother    Bipolar disorder Brother    Healthy Daughter    Healthy Son     Social History:  Social History   Socioeconomic History   Marital status: Divorced    Spouse name: Not on file   Number of children: 2   Years of education: some college   Highest education level: Some college, no degree  Occupational History   Occupation: Applying for Social Security Disability    Employer: NOT EMPLOYED  Tobacco Use   Smoking status: Former    Packs/day: 0.25    Years: 6.00    Pack years: 1.50    Types: Cigarettes    Start date: 05/2018   Smokeless tobacco:  Never  Vaping Use   Vaping Use: Some days  Substance and Sexual Activity   Alcohol use: Not Currently    Alcohol/week: 0.0 standard drinks   Drug use: Not Currently   Sexual activity: Yes    Partners:  Male    Birth control/protection: Surgical, Condom    Comment: tubal and ablation  Other Topics Concern   Not on file  Social History Narrative   Patient is right handed.   Patient drinks one cup of coffee daily.   She lives in a one-story home with her two children (59 year old son, 24 month old daughter).   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 [employer] Strain: [employer] Risk   Difficulty of Paying Living Expenses: Somewhat hard  Food Insecurity: Landscape architect Present   Worried About Charity fundraiser in the Last Year: Sometimes true   Arboriculturist in the Last Year: Sometimes true  Transportation Needs: No Transportation Needs   Lack of Transportation (Medical): No   Lack of Transportation (Non-Medical): No  Physical Activity: Inactive   Days of Exercise per Week: 0 days   Minutes of Exercise per Session: 10 min  Stress: Stress Concern Present   Feeling of Stress : Very much  Social Connections: Moderately Isolated   Frequency of Communication with Friends and Family: More than three times a week   Frequency of Social Gatherings with Friends and Family: Once a week   Attends Religious Services: 1 to 4 times per year   Active Member of [employer] or Organizations: No   Attends Archivist Meetings: Never   Marital Status: Divorced    Allergies:  Allergies  Allergen Reactions   Gabapentin Other (See Comments)    Pt states that she was unresponsive after taking this medication, but her vitals remained stable.     Metoclopramide Hcl Anxiety and Other (See Comments)    Pt states that she felt like she was trapped in a box, and could not get out.  Pt also states that she had  temporary loss of movement, weakness, and tingling.     Tramadol Other (See Comments)    Seizures   Ativan [Lorazepam] Other (See Comments)    combative   Latex Itching, Rash and Other (See Comments)    Burns   Lyrica [Pregabalin] Other (See Comments)    suicidal   Iron Other (See Comments)    PATIENT IS INTOLERANT TO ORAL IRON , SHE DOES NOT ABSORB IT.     Sulfa Antibiotics Hives   Butrans [Buprenorphine] Rash    Patch caused rash, didn't help with pain   Nucynta [Tapentadol] Nausea And Vomiting   Propofol Nausea And Vomiting   Tape Hives, Itching and Rash    Please use paper tape   Toradol [Ketorolac Tromethamine] Anxiety    Metabolic Disorder Labs: Lab Results  Component Value Date   HGBA1C 5.7 (H) 01/15/2015   Lab Results  Component Value Date   PROLACTIN 80.0 (H) 05/09/2015   Lab Results  Component Value Date   CHOL 95 03/10/2012   TRIG 50 03/10/2012   HDL 38 (L) 03/10/2012   CHOLHDL 2.5 03/10/2012   VLDL 10 03/10/2012   LDLCALC 47 03/10/2012   Lab Results  Component Value Date   TSH 1.350 01/01/2021   TSH 2.616 08/25/2019    Therapeutic Level Labs: No results found for: LITHIUM Lab Results  Component Value Date   VALPROATE 66 06/23/2020   No components found for:  CBMZ  Current Medications: Current Outpatient Medications  Medication Sig Dispense Refill   Suvorexant (BELSOMRA) 20 MG TABS Take 20 mg  by mouth at bedtime. 30 tablet 2   amphetamine-dextroamphetamine (ADDERALL XR) 30 MG 24 hr capsule Take 1 capsule (30 mg total) by mouth every morning. 30 capsule 0   Calcium Carb-Cholecalciferol (CALCIUM-VITAMIN D3) 600-500 MG-UNIT CAPS Take 1 tablet by mouth daily.     Carboxymethylcellul-Glycerin (LUBRICATING EYE DROPS OP) Place 1 drop into both eyes daily as needed (dry eyes).     cetirizine (ZYRTEC) 10 MG tablet TAKE ONE TABLET BY MOUTH DAILY (Patient taking differently: Take 10 mg by mouth daily.) 30 tablet 5   clidinium-chlordiazePOXIDE (LIBRAX)  5-2.5 MG capsule Take 1 capsule by mouth 3 (three) times daily before meals. 90 capsule 5   clonazePAM (KLONOPIN) 0.5 MG tablet Take 1 tablet (0.5 mg total) by mouth 2 (two) times daily as needed. 60 tablet 2   DULoxetine (CYMBALTA) 60 MG capsule Take 1 capsule (60 mg total) by mouth 2 (two) times daily. 180 capsule 2   Erenumab-aooe (AIMOVIG) 140 MG/ML SOAJ Inject 140 mg into the skin every 28 (twenty-eight) days. 1 mL 11   fluticasone (FLOVENT HFA) 220 MCG/ACT inhaler Inhale 2 puffs into the lungs 2 (two) times daily. Do not inhale, swallow powder and drink water , do not eat food for 20 minutes after puffs 4 each 2   HYDROmorphone (DILAUDID) 4 MG tablet Take 4 mg by mouth every 4 (four) hours as needed for severe pain.     lansoprazole (PREVACID) 30 MG capsule Take 1 capsule (30 mg total) by mouth daily. 180 capsule 1   levalbuterol (XOPENEX HFA) 45 MCG/ACT inhaler INHALE (2) PUFFS INTO THE LUNGS EVERY SIX HOURS AS NEEDED FOR WHEEZING. (Patient taking differently: Inhale 2 puffs into the lungs every 6 (six) hours as needed for wheezing or shortness of breath. INHALE (2) PUFFS INTO THE LUNGS EVERY SIX HOURS AS NEEDED FOR WHEEZING.) 15 g 3   levalbuterol (XOPENEX) 1.25 MG/3ML nebulizer solution Take 1.25 mg by nebulization every 4 (four) hours as needed for wheezing. 50 mL 3   linaclotide (LINZESS) 290 MCG CAPS capsule Take 1 capsule (290 mcg total) by mouth daily before breakfast. 90 capsule 3   meclizine (ANTIVERT) 25 MG tablet Take 1 tablet (25 mg total) by mouth 3 (three) times daily as needed for dizziness. 30 tablet 0   metroNIDAZOLE (METROGEL VAGINAL) 0.75 % vaginal gel Place 1 Applicatorful vaginally at bedtime. 70 g 0   pantoprazole (PROTONIX) 40 MG tablet TAKE 1 TABLET BY MOUTH TWICE DAILY BEFORE MEALS. 60 tablet 2   Pediatric Multivit-Minerals-C (FLINTSTONES GUMMIES) chewable tablet Chew 3 tablets by mouth daily.     promethazine (PHENERGAN) 12.5 MG tablet TAKE 1 TABLET BY MOUTH EVERY 8  HOURS AS NEEDED FOR NAUSEA OR VOMITING 30 tablet 0   promethazine (PHENERGAN) 25 MG tablet Take 25 mg by mouth 2 (two) times daily as needed for nausea or vomiting.     Rimegepant Sulfate (NURTEC) 75 MG TBDP Take 1 tablet as needed for severe migraine.  Limit to twice per week. 10 tablet 3   tiZANidine (ZANAFLEX) 4 MG tablet Take 4 mg by mouth 3 (three) times daily as needed.     Vitamin D, Ergocalciferol, (DRISDOL) 1.25 MG (50000 UNIT) CAPS capsule Take 50,000 Units by mouth every Wednesday.     No current facility-administered medications for this visit.     Musculoskeletal: Strength & Muscle Tone: within normal limits Gait & Station: normal Patient leans: N/A  Psychiatric Specialty Exam: Review of Systems  Musculoskeletal:  Positive for back  pain.  Neurological:  Positive for headaches.  Psychiatric/Behavioral:  Positive for sleep disturbance. The patient is nervous/anxious.   All other systems reviewed and are negative.  There were no vitals taken for this visit.There is no height or weight on file to calculate BMI.  General Appearance: Casual and Fairly Groomed  Eye Contact:  Good  Speech:  Clear and Coherent  Volume:  Normal  Mood:  Anxious  Affect:  Tearful  Thought Process:  Goal Directed  Orientation:  Full (Time, Place, and Person)  Thought Content: Rumination   Suicidal Thoughts:  No  Homicidal Thoughts:  No  Memory:  Immediate;   Good Recent;   Good Remote;   Fair  Judgement:  Fair  Insight:  Fair  Psychomotor Activity:  Decreased  Concentration:  Concentration: Fair and Attention Span: Fair  Recall:  Good  Fund of Knowledge: Good  Language: Good  Akathisia:  No  Handed:  Right  AIMS (if indicated): not done  Assets:  Communication Skills Desire for Improvement Resilience Social Support Talents/Skills  ADL's:  Intact  Cognition: WNL  Sleep:  Poor   Screenings: AIMS    Flowsheet Row Admission (Discharged) from 06/19/2020 in [facility]  AIMS Total Score 0      AUDIT    Flowsheet Row Patient Outreach Telephone from 07/14/2020 in [facility] Coordination Admission (Discharged) from 06/19/2020 in [facility] ED to Hosp-Admission (Discharged) from 05/09/2015 in [facility]  Alcohol Use Disorder Identification Test Final Score (AUDIT) 1 1 0      GAD-7    Flowsheet Row Office Visit from 08/25/2021 in [facility] Counselor from 12/11/2018 in [facility] Counselor from 02/07/2017 in [facility]  Total GAD-7 Score $RemoveBef'13 19 17      'FEFNuENHvM$ PHQ2-9    Flowsheet Row Video Visit from 09/24/2021 in [facility] Video Visit from 09/07/2021 in [facility] Office Visit from 08/25/2021 in [facility] Video Visit from 07/13/2021 in [facility] Video Visit from 05/12/2021 in [facility]  PHQ-2 Total Score $RemoveBef'2 4 3 4 1  'VoDPfBnQRP$ PHQ-9 Total Score $RemoveBef'6 13 13 5 'nLiGchItGX$ --      Flowsheet Row Video Visit from 09/24/2021 in [facility] Video Visit from 09/07/2021 in [facility] ED from 08/31/2021 in [facility] No Risk No Risk No Risk        Assessment and Plan: This patient is a 35 year old female with a history of anxiety depression ADHD borderline personality disorder insomnia and pseudoseizures.  She is still not sleeping well so we will discontinue trazodone and start Belsomra 20 mg at bedtime.  She will continue Cymbalta 120 mg daily for depression, Adderall XR 30 mg every morning for focus and clonazepam 0.5 mg twice daily only as needed for anxiety.  She will return to  counseling and return to see me in 4 weeks   [name], MD 09/24/2021, 2:00 PM

## 2021-09-30 ENCOUNTER — Encounter (INDEPENDENT_AMBULATORY_CARE_PROVIDER_SITE_OTHER): Payer: Self-pay | Admitting: *Deleted

## 2021-10-02 ENCOUNTER — Telehealth (HOSPITAL_COMMUNITY): Payer: Self-pay | Admitting: Psychiatry

## 2021-10-02 NOTE — Telephone Encounter (Signed)
Patient called wanting to confirm all RXs have been sent in and has prior auths if needed. Patient would like a call back to confirm.

## 2021-10-02 NOTE — Telephone Encounter (Signed)
I have sent in all scripts as I always do at time of visit. If PAs are needed, pharmacy will contact [facility] who does them, please let pt know

## 2021-10-04 ENCOUNTER — Encounter: Payer: Self-pay | Admitting: Emergency Medicine

## 2021-10-04 ENCOUNTER — Other Ambulatory Visit: Payer: Self-pay

## 2021-10-04 ENCOUNTER — Ambulatory Visit: Payer: Medicaid Other

## 2021-10-04 ENCOUNTER — Ambulatory Visit
Admission: EM | Admit: 2021-10-04 | Discharge: 2021-10-04 | Disposition: A | Discharge status: Home / Self Care | Payer: Medicaid Other | Attending: Urgent Care | Admitting: Urgent Care

## 2021-10-04 DIAGNOSIS — R051 Acute cough: Secondary | ICD-10-CM

## 2021-10-04 DIAGNOSIS — J018 Other acute sinusitis: Secondary | ICD-10-CM | POA: Diagnosis not present

## 2021-10-04 DIAGNOSIS — Z20822 Contact with and (suspected) exposure to covid-19: Secondary | ICD-10-CM

## 2021-10-04 DIAGNOSIS — R0981 Nasal congestion: Secondary | ICD-10-CM

## 2021-10-04 MED ORDER — BENZONATATE 100 MG PO CAPS: 100.0000 mg | ORAL_CAPSULE | Freq: Three times a day (TID) | ORAL | 0 refills | Status: DC | PRN

## 2021-10-04 MED ORDER — PROMETHAZINE-DM 6.25-15 MG/5ML PO SYRP: 5.0000 mL | ORAL_SOLUTION | Freq: Every evening | ORAL | 0 refills | Status: DC | PRN

## 2021-10-04 MED ORDER — PSEUDOEPHEDRINE HCL 60 MG PO TABS: 60.0000 mg | ORAL_TABLET | Freq: Three times a day (TID) | ORAL | 0 refills | Status: DC | PRN

## 2021-10-04 MED ORDER — AMOXICILLIN-POT CLAVULANATE 875-125 MG PO TABS: 1.0000 | ORAL_TABLET | Freq: Two times a day (BID) | ORAL | 0 refills | Status: DC

## 2021-10-04 MED ORDER — FLUCONAZOLE 150 MG PO TABS: 150.0000 mg | ORAL_TABLET | ORAL | 0 refills | Status: DC

## 2021-10-04 MED ORDER — CETIRIZINE HCL 10 MG PO TABS: 10.0000 mg | ORAL_TABLET | Freq: Every day | ORAL | 0 refills | Status: DC

## 2021-10-04 NOTE — ED Triage Notes (Signed)
Sore throat, cough and congestion x 1 week, body aches

## 2021-10-04 NOTE — ED Provider Notes (Signed)
[place]   MRN: [id number] DOB: [birth year]  Subjective:   Penny [name] is a 35 y.o. female presenting for 1 week history of persistent and worsening malaise, fatigue, body aches, sinus pressure and congestion, coughing, throat pain.  Patient uses Claritin for her sinuses.  Has had 1 sick contact with her daughter, was also seen and reports that she was tested for everything, was negative.  No active chest pain, shortness of breath or wheezing.  No current facility-administered medications for this encounter.  Current Outpatient Medications:    amphetamine-dextroamphetamine (ADDERALL XR) 30 MG 24 hr capsule, Take 1 capsule (30 mg total) by mouth every morning., Disp: 30 capsule, Rfl: 0   Calcium Carb-Cholecalciferol (CALCIUM-VITAMIN D3) 600-500 MG-UNIT CAPS, Take 1 tablet by mouth daily., Disp: , Rfl:    Carboxymethylcellul-Glycerin (LUBRICATING EYE DROPS OP), Place 1 drop into both eyes daily as needed (dry eyes)., Disp: , Rfl:    cetirizine (ZYRTEC) 10 MG tablet, TAKE ONE TABLET BY MOUTH DAILY (Patient taking differently: Take 10 mg by mouth daily.), Disp: 30 tablet, Rfl: 5   clidinium-chlordiazePOXIDE (LIBRAX) 5-2.5 MG capsule, Take 1 capsule by mouth 3 (three) times daily before meals., Disp: 90 capsule, Rfl: 5   clonazePAM (KLONOPIN) 0.5 MG tablet, Take 1 tablet (0.5 mg total) by mouth 2 (two) times daily as needed., Disp: 60 tablet, Rfl: 2   DULoxetine (CYMBALTA) 60 MG capsule, Take 1 capsule (60 mg total) by mouth 2 (two) times daily., Disp: 180 capsule, Rfl: 2   Erenumab-aooe (AIMOVIG) 140 MG/ML SOAJ, Inject 140 mg into the skin every 28 (twenty-eight) days., Disp: 1 mL, Rfl: 11   fluticasone (FLOVENT HFA) 220 MCG/ACT inhaler, Inhale 2 puffs into the lungs 2 (two) times daily. Do not inhale, swallow powder and drink water , do not eat food for 20 minutes after puffs, Disp: 4 each, Rfl: 2   HYDROmorphone (DILAUDID) 4 MG tablet, Take 4 mg by mouth every 4 (four) hours  as needed for severe pain., Disp: , Rfl:    lansoprazole (PREVACID) 30 MG capsule, Take 1 capsule (30 mg total) by mouth daily., Disp: 180 capsule, Rfl: 1   levalbuterol (XOPENEX HFA) 45 MCG/ACT inhaler, INHALE (2) PUFFS INTO THE LUNGS EVERY SIX HOURS AS NEEDED FOR WHEEZING. (Patient taking differently: Inhale 2 puffs into the lungs every 6 (six) hours as needed for wheezing or shortness of breath. INHALE (2) PUFFS INTO THE LUNGS EVERY SIX HOURS AS NEEDED FOR WHEEZING.), Disp: 15 g, Rfl: 3   levalbuterol (XOPENEX) 1.25 MG/3ML nebulizer solution, Take 1.25 mg by nebulization every 4 (four) hours as needed for wheezing., Disp: 50 mL, Rfl: 3   linaclotide (LINZESS) 290 MCG CAPS capsule, Take 1 capsule (290 mcg total) by mouth daily before breakfast., Disp: 90 capsule, Rfl: 3   meclizine (ANTIVERT) 25 MG tablet, Take 1 tablet (25 mg total) by mouth 3 (three) times daily as needed for dizziness., Disp: 30 tablet, Rfl: 0   metroNIDAZOLE (METROGEL VAGINAL) 0.75 % vaginal gel, Place 1 Applicatorful vaginally at bedtime., Disp: 70 g, Rfl: 0   pantoprazole (PROTONIX) 40 MG tablet, TAKE 1 TABLET BY MOUTH TWICE DAILY BEFORE MEALS., Disp: 60 tablet, Rfl: 2   Pediatric Multivit-Minerals-C (FLINTSTONES GUMMIES) chewable tablet, Chew 3 tablets by mouth daily., Disp: , Rfl:    promethazine (PHENERGAN) 12.5 MG tablet, TAKE 1 TABLET BY MOUTH EVERY 8 HOURS AS NEEDED FOR NAUSEA OR VOMITING, Disp: 30 tablet, Rfl: 0   promethazine (PHENERGAN) 25 MG tablet, Take 25  mg by mouth 2 (two) times daily as needed for nausea or vomiting., Disp: , Rfl:    Rimegepant Sulfate (NURTEC) 75 MG TBDP, Take 1 tablet as needed for severe migraine.  Limit to twice per week., Disp: 10 tablet, Rfl: 3   Suvorexant (BELSOMRA) 20 MG TABS, Take 20 mg by mouth at bedtime., Disp: 30 tablet, Rfl: 2   tiZANidine (ZANAFLEX) 4 MG tablet, Take 4 mg by mouth 3 (three) times daily as needed., Disp: , Rfl:    Vitamin D, Ergocalciferol, (DRISDOL) 1.25 MG (50000  UNIT) CAPS capsule, Take 50,000 Units by mouth every Wednesday., Disp: , Rfl:    Allergies  Allergen Reactions   Gabapentin Other (See Comments)    Pt states that she was unresponsive after taking this medication, but her vitals remained stable.     Metoclopramide Hcl Anxiety and Other (See Comments)    Pt states that she felt like she was trapped in a box, and could not get out.  Pt also states that she had temporary loss of movement, weakness, and tingling.     Tramadol Other (See Comments)    Seizures   Ativan [Lorazepam] Other (See Comments)    combative   Latex Itching, Rash and Other (See Comments)    Burns   Lyrica [Pregabalin] Other (See Comments)    suicidal   Iron Other (See Comments)    PATIENT IS INTOLERANT TO ORAL IRON , SHE DOES NOT ABSORB IT.     Sulfa Antibiotics Hives   Butrans [Buprenorphine] Rash    Patch caused rash, didn't help with pain   Nucynta [Tapentadol] Nausea And Vomiting   Propofol Nausea And Vomiting   Tape Hives, Itching and Rash    Please use paper tape   Toradol [Ketorolac Tromethamine] Anxiety    Past Medical History:  Diagnosis Date   ADD (attention deficit disorder)    Anemia 2011   2o to GASTRIC BYPASS   Anginal pain ([place])    Anxiety    Asthma    Blood transfusion without reported diagnosis    Chronic daily headache    Depression    Depression    Dysmenorrhea 09/18/2013   Dysrhythmia    Elevated liver enzymes JUL 2011ALK PHOS 111-127 AST  143-267 ALT  213-321T BILI 0.6  ALB  3.7-4.06 Jun 2011 ALK PHOS 118 AST 24 ALT 42 T BILI 0.4 ALB 3.9   Encounter for drug rehabilitation    at behavioral health for opioid addiction about 4 years ago   Family history of adverse reaction to anesthesia    'dad had to be kept on pump for breathing for morphine'   Fatty liver    Fibroid 01/18/2017   Fibromyalgia    Gastric bypass status for obesity    Gastritis JULY 2011   Heart murmur    Hereditary and idiopathic peripheral neuropathy  01/15/2015   History of Holter monitoring    Hx of opioid abuse ([place])    for about 4 years, about 4 years ago   Interstitial cystitis    Iron deficiency anemia 07/23/2010   Irritable bowel syndrome 2012 DIARRHEA   JUN 2012 TTG IgA 14.9   IUD FEB 2010   Lupus ([place])    Menorrhagia 07/18/2013   Migraines    Obesity (BMI 30-39.9) 2011 228 LBS BMI 36.8   Osteoporosis    Ovarian cyst    Patient desires pregnancy 09/18/2013   Polyneuropathy    PONV (postoperative nausea and  vomiting) seizure post-operatively   seizure following ankle surgery   Potassium (K) deficiency    Pregnant 12/25/2013   Psychiatric pseudoseizure    RLQ abdominal pain 07/18/2013   Sciatica of left side 11/14/2014   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   Stress 09/18/2013     Past Surgical History:  Procedure Laterality Date   BIOPSY  04/10/2015   Procedure: BIOPSY;  Surgeon: Daneil Dolin, MD;  Location: [facility];  Service: Endoscopy;;   BIOPSY  04/15/2021   Procedure: BIOPSY;  Surgeon: Montez Morita, Quillian Quince, MD;  [facility];  Service: Gastroenterology;;   cath self every nite     for sodium bicarb injection (discontinued 2013)   CHOLECYSTECTOMY  2005   biliary dyskinesia   COLONOSCOPY  JUN 2012 ABD PN/DIARRHEA WITH PROPOFOL   NL COLON   COLONOSCOPY WITH PROPOFOL N/A 01/16/2021   Procedure: COLONOSCOPY WITH PROPOFOL;  Surgeon: Harvel Quale, MD;  [facility];  Service: Gastroenterology;  Laterality: N/A;  1:45   DILATION AND CURETTAGE OF UTERUS     DILITATION & CURRETTAGE/HYSTROSCOPY WITH NOVASURE ABLATION N/A 03/24/2017   Procedure: DILATATION & CURETTAGE/HYSTEROSCOPY WITH NOVASURE ENDOMETRIAL ABLATION;  Surgeon: Jonnie Kind, MD;  Location: [facility];  Service: Gynecology;  Laterality: N/A;   ESOPHAGEAL DILATION N/A 04/10/2015   Procedure: ESOPHAGEAL DILATION WITH 54FR MALONEY DILATOR;  Surgeon: Daneil Dolin, MD;  Location: [facility];  Service: Endoscopy;  Laterality: N/A;    ESOPHAGOGASTRODUODENOSCOPY     ESOPHAGOGASTRODUODENOSCOPY (EGD) WITH PROPOFOL N/A 04/10/2015   Procedure: ESOPHAGOGASTRODUODENOSCOPY (EGD) WITH PROPOFOL;  Surgeon: Daneil Dolin, MD;  [facility];  Service: Endoscopy;  Laterality: N/A;   ESOPHAGOGASTRODUODENOSCOPY (EGD) WITH PROPOFOL N/A 12/06/2016   Procedure: ESOPHAGOGASTRODUODENOSCOPY (EGD) WITH PROPOFOL;  Surgeon: Danie Binder, MD;  [facility];  Service: Endoscopy;  Laterality: N/A;   ESOPHAGOGASTRODUODENOSCOPY (EGD) WITH PROPOFOL N/A 04/27/2019   Procedure: ESOPHAGOGASTRODUODENOSCOPY (EGD) WITH PROPOFOL;  Surgeon: Rogene Houston, MD;  [facility];  Service: Endoscopy;  Laterality: N/A;  730   ESOPHAGOGASTRODUODENOSCOPY (EGD) WITH PROPOFOL N/A 08/31/2019   Procedure: ESOPHAGOGASTRODUODENOSCOPY (EGD) WITH PROPOFOL;  Surgeon: Rogene Houston, MD;  [facility];  Service: Endoscopy;  Laterality: N/A;   ESOPHAGOGASTRODUODENOSCOPY (EGD) WITH PROPOFOL N/A 01/16/2021   Procedure: ESOPHAGOGASTRODUODENOSCOPY (EGD) WITH PROPOFOL;  Surgeon: Harvel Quale, MD;  [facility];  Service: Gastroenterology;  Laterality: N/A;   ESOPHAGOGASTRODUODENOSCOPY (EGD) WITH PROPOFOL N/A 04/15/2021   Procedure: ESOPHAGOGASTRODUODENOSCOPY (EGD) WITH PROPOFOL;  Surgeon: Harvel Quale, MD;  [facility];  Service: Gastroenterology;  Laterality: N/A;  9:00 AM   GAB  2007   in [place]-POUCH 5 CM   GASTRIC BYPASS  06/2006   HYSTEROSCOPY WITH D & C N/A 09/12/2014   Procedure: DILATATION AND CURETTAGE /HYSTEROSCOPY;  Surgeon: Jonnie Kind, MD;  Location: [facility];  Service: Gynecology;  Laterality: N/A;   KYPHOPLASTY N/A 08/02/2018   Procedure: KYPHOPLASTY T12;  Surgeon: Melina Schools, MD;  Location: [facility];  Service: Orthopedics;  Laterality: N/A;  60 mins   LAPAROSCOPIC TUBAL LIGATION Bilateral 03/24/2017   Procedure: LAPAROSCOPIC TUBAL LIGATION ([place] Rings);  Surgeon: Jonnie Kind,  MD;  Location: [facility];  Service: Gynecology;  Laterality: Bilateral;   REPAIR VAGINAL CUFF N/A 07/30/2014   Procedure: REPAIR VAGINAL CUFF;  Surgeon: Mora Bellman, MD;  Location: [facility];  Service: Gynecology;  Laterality: N/A;   SAVORY DILATION  06/20/2012  Dr. Barnie Alderman gastritis/Ulcer in the mid jejunum. Empiric dilation.    SURAL NERVE BX Left 02/25/2016   Procedure: LEFT SURAL NERVE BIOPSY;  Surgeon: Jovita Gamma, MD;  Location: [facility];  Service: Neurosurgery;  Laterality: Left;  Left sural nerve biopsy   TONSILLECTOMY     TONSILLECTOMY AND ADENOIDECTOMY     UPPER GASTROINTESTINAL ENDOSCOPY  JULY 2011 NAUSEA-D125,V6, PH 25   Bx; GASTRITIS, POUCH-5 CM LONG   WISDOM TOOTH EXTRACTION      Family History  Problem Relation Age of Onset   Coronary artery disease Paternal Grandfather    Migraines Paternal Grandmother    Breast cancer Paternal Grandmother    Hemochromatosis Maternal Grandmother    Migraines Maternal Grandmother    Cancer Maternal Grandmother    Breast cancer Maternal Grandmother    Hypertension Father    Diabetes Father    Coronary artery disease Father    Cancer Mother        breast   Hemochromatosis Mother    Breast cancer Mother    Depression Mother    Anxiety disorder Mother    Anxiety disorder Brother    Bipolar disorder Brother    Healthy Daughter    Healthy Son     Social History   Tobacco Use   Smoking status: Former    Packs/day: 0.25    Years: 6.00    Pack years: 1.50    Types: Cigarettes    Start date: 05/2018   Smokeless tobacco: Never  Vaping Use   Vaping Use: Some days  Substance Use Topics   Alcohol use: Not Currently    Alcohol/week: 0.0 standard drinks   Drug use: Not Currently    ROS   Objective:   Vitals: BP 106/74 (BP Location: Right Arm)   Pulse (!) 105   Temp 97.6 F (36.4 C) (Oral)   Resp 18   SpO2 96%   Physical Exam Constitutional:      General: She is not in acute distress.    Appearance: Normal  appearance. She is well-developed. She is not ill-appearing, toxic-appearing or diaphoretic.  HENT:     Head: Normocephalic and atraumatic.     Right Ear: Tympanic membrane, ear canal and external ear normal. No drainage or tenderness. No middle ear effusion. Tympanic membrane is not erythematous.     Left Ear: Tympanic membrane, ear canal and external ear normal. No drainage or tenderness.  No middle ear effusion. Tympanic membrane is not erythematous.     Nose: Congestion and rhinorrhea present.     Mouth/Throat:     Mouth: Mucous membranes are moist. No oral lesions.     Pharynx: No pharyngeal swelling, oropharyngeal exudate, posterior oropharyngeal erythema or uvula swelling.     Tonsils: No tonsillar exudate or tonsillar abscesses.  Eyes:     General: No scleral icterus.       Right eye: No discharge.        Left eye: No discharge.     Extraocular Movements: Extraocular movements intact.     Right eye: Normal extraocular motion.     Left eye: Normal extraocular motion.     Conjunctiva/sclera: Conjunctivae normal.     Pupils: Pupils are equal, round, and reactive to light.  Cardiovascular:     Rate and Rhythm: Normal rate and regular rhythm.     Pulses: Normal pulses.     Heart sounds: Normal heart sounds. No murmur heard.   No friction rub. No gallop.  Pulmonary:  Effort: Pulmonary effort is normal. No respiratory distress.     Breath sounds: Normal breath sounds. No stridor. No wheezing, rhonchi or rales.  Musculoskeletal:     Cervical back: Normal range of motion and neck supple.  Lymphadenopathy:     Cervical: No cervical adenopathy.  Skin:    General: Skin is warm and dry.     Findings: No rash.  Neurological:     General: No focal deficit present.     Mental Status: She is alert and oriented to person, place, and time.  Psychiatric:        Mood and Affect: Mood normal.        Behavior: Behavior normal.        Thought Content: Thought content normal.     Assessment and Plan :   PDMP not reviewed this encounter.  1. Acute non-recurrent sinusitis of other sinus   2. Exposure to COVID-19 virus   3. Nasal congestion   4. Acute cough    Will start empiric treatment for sinusitis with Augmentin.  Recommended supportive care otherwise including the use of oral antihistamine, decongestant. Deferred imaging given clear cardiopulmonary exam, hemodynamically stable vital signs. Counseled patient on potential for adverse effects with medications prescribed/recommended today, ER and return-to-clinic precautions discussed, patient verbalized understanding.     Jaynee Eagles, [place] 10/04/21 1129

## 2021-10-05 LAB — COVID-19, FLU A+B NAA
Influenza A, NAA: NOT DETECTED
Influenza B, NAA: NOT DETECTED
SARS-CoV-2, NAA: NOT DETECTED

## 2021-10-05 NOTE — Telephone Encounter (Signed)
It was a PA that was needed and it was sent to the pharmacy. Staff called patient and LMOM to inform her with information and office number was provided on voicemail.

## 2021-10-10 ENCOUNTER — Ambulatory Visit
Admission: RE | Admit: 2021-10-10 | Discharge: 2021-10-10 | Disposition: A | Discharge status: Home / Self Care | Payer: Medicaid Other | Source: Ambulatory Visit | Attending: Family Medicine | Admitting: Family Medicine

## 2021-10-10 ENCOUNTER — Other Ambulatory Visit: Payer: Self-pay

## 2021-10-13 ENCOUNTER — Ambulatory Visit (INDEPENDENT_AMBULATORY_CARE_PROVIDER_SITE_OTHER): Payer: Medicaid Other | Admitting: Clinical

## 2021-10-13 ENCOUNTER — Other Ambulatory Visit: Payer: Self-pay

## 2021-10-13 DIAGNOSIS — F332 Major depressive disorder, recurrent severe without psychotic features: Secondary | ICD-10-CM | POA: Diagnosis not present

## 2021-10-13 DIAGNOSIS — F411 Generalized anxiety disorder: Secondary | ICD-10-CM | POA: Diagnosis not present

## 2021-10-13 NOTE — Progress Notes (Signed)
Virtual Visit via Telephone Note   I connected with Penny [name] on 10/13/21 at  8:00 AM EDT by telephone and verified that I am speaking with the correct person using two identifiers.   Location: Patient: Home Provider: Office   I discussed the limitations, risks, security and privacy concerns of performing an evaluation and management service by telephone and the availability of in person appointments. I also discussed with the patient that there may be a patient responsible charge related to this service. The patient expressed understanding and agreed to proceed.     THERAPIST PROGRESS NOTE   Session Time: 8:00AM-8:55AM   Participation Level: Active   Behavioral Response: CasualAlertDepressed and NA   Type of Therapy: Individual Therapy   Treatment Goals addressed: Coping   Interventions: CBT, Motivational Interviewing, Strength-based and Supportive   Summary: Penny [name] is a 35 y.o. female who presents with GAD and MDD. The OPT therapist worked with the patient for her ongoing OPT treatment session. The OPT therapist utilized Motivational Interviewing to assist in creating therapeutic repore. The patient in the session was engaged and work in collaboration giving feedback about her triggers and symptoms over the past few weeks. The patient spoke about emotional triggering due to triangulation between the patient and her family around her families interaction with her ex-husband. The OPT therapist worked with the patient implementing elements of CBT. The OPT therapist provided psycho-education during the session. The OPT therapist worked with the patient on using her supports and communicating her feelings and focusing in areas that she has control in. The OPT therapist reviewed with the patient her upcoming health appointments. The patient spoke about her willingness to challenge negative thinking and not allow external factors she cannot control to escalate in its impact on her mental  health.   Suicidal/Homicidal: Nowithout intent/plan   Therapist Response: The OPT therapist worked with the patient for the patients scheduled session. The patient was engaged in her session and gave feedback in relation to triggers, symptoms, and behavior responses over the past few weeks. The patient has been having difficulty managing symptoms of her GAD and MDD . The OPT therapist in session worked with the patient and utilized elements of CBT in working with the patient to help empower the patient to set healthy boundaries and put her effort into areas she has control to make decisions about. The OPT therapist worked with the patient on, positive thinking, communication of her feelings and not allowing automatic negative thoughts dominate, and use her coping strategies. The patient spoke about her experience with her family members being closer to her ex-husband and showing un-loyal favoritism to her ex-husband.. The OPT therapist worked with the patient to not take on guilt for other peoples choices.The OPT therapist will continue to work with the patient for her ongoing OPT treatment.   Plan: Return again in 2/3 weeks.   Diagnosis:      Axis I: Depression and GAD                           Axis II: No diagnosis   I discussed the assessment and treatment plan with the patient. The patient was provided an opportunity to ask questions and all were answered. The patient agreed with the plan and demonstrated an understanding of the instructions.   The patient was advised to call back or seek an in-person evaluation if the symptoms worsen or if the condition fails to improve as  anticipated.   I provided 55 minutes of non-face-to-face time during this encounter.   Penny Grumbles, LCSW   10/13/2021

## 2021-10-14 ENCOUNTER — Ambulatory Visit: Payer: Medicaid Other | Admitting: Neurology

## 2021-10-16 ENCOUNTER — Telehealth: Payer: Self-pay | Admitting: Family Medicine

## 2021-10-16 ENCOUNTER — Other Ambulatory Visit: Payer: Self-pay | Admitting: Family Medicine

## 2021-10-16 MED ORDER — PANTOPRAZOLE SODIUM 40 MG PO TBEC: 40.0000 mg | DELAYED_RELEASE_TABLET | Freq: Two times a day (BID) | ORAL | 5 refills | Status: DC

## 2021-10-16 NOTE — Telephone Encounter (Signed)
Patient notified

## 2021-10-16 NOTE — Telephone Encounter (Signed)
Refills [facility]

## 2021-10-16 NOTE — Telephone Encounter (Signed)
Pharmacy states the patient needs a new script for protonix

## 2021-10-16 NOTE — Telephone Encounter (Signed)
Patient called about her Protonix. Stated that Penny [name] has been trying to fill it but I keeps getting rejected. She has been without for several days and her body is starting to reject food again.Please advise.  Pt#: [phone]

## 2021-10-17 DIAGNOSIS — F449 Dissociative and conversion disorder, unspecified: Secondary | ICD-10-CM | POA: Diagnosis not present

## 2021-10-17 DIAGNOSIS — N911 Secondary amenorrhea: Secondary | ICD-10-CM | POA: Diagnosis not present

## 2021-10-17 DIAGNOSIS — Z79899 Other long term (current) drug therapy: Secondary | ICD-10-CM | POA: Diagnosis not present

## 2021-10-17 DIAGNOSIS — M546 Pain in thoracic spine: Secondary | ICD-10-CM | POA: Diagnosis not present

## 2021-10-21 ENCOUNTER — Telehealth (HOSPITAL_COMMUNITY): Payer: Self-pay | Admitting: *Deleted

## 2021-10-21 ENCOUNTER — Other Ambulatory Visit (HOSPITAL_COMMUNITY): Payer: Self-pay | Admitting: Psychiatry

## 2021-10-21 ENCOUNTER — Telehealth (HOSPITAL_COMMUNITY): Payer: Medicaid Other | Admitting: Psychiatry

## 2021-10-21 MED ORDER — AMPHETAMINE-DEXTROAMPHET ER 30 MG PO CP24: 30.0000 mg | ORAL_CAPSULE | ORAL | 0 refills | Status: DC

## 2021-10-21 NOTE — Telephone Encounter (Signed)
Patient had an appt for 10-21-2021 and appt was rescheduled due to provider being out office.  Patient f/u is 10-22-2021.  Patient is needing refills for Adderall XR

## 2021-10-21 NOTE — Telephone Encounter (Signed)
sent 

## 2021-10-22 ENCOUNTER — Telehealth (HOSPITAL_COMMUNITY): Payer: Medicaid Other | Admitting: Psychiatry

## 2021-10-22 ENCOUNTER — Encounter (HOSPITAL_COMMUNITY): Payer: Self-pay | Admitting: Psychiatry

## 2021-10-22 ENCOUNTER — Other Ambulatory Visit: Payer: Self-pay

## 2021-10-22 ENCOUNTER — Telehealth (INDEPENDENT_AMBULATORY_CARE_PROVIDER_SITE_OTHER): Payer: Medicaid Other | Admitting: Psychiatry

## 2021-10-22 DIAGNOSIS — F332 Major depressive disorder, recurrent severe without psychotic features: Secondary | ICD-10-CM

## 2021-10-22 DIAGNOSIS — F411 Generalized anxiety disorder: Secondary | ICD-10-CM | POA: Diagnosis not present

## 2021-10-22 DIAGNOSIS — F450 Somatization disorder: Secondary | ICD-10-CM | POA: Diagnosis not present

## 2021-10-22 DIAGNOSIS — F900 Attention-deficit hyperactivity disorder, predominantly inattentive type: Secondary | ICD-10-CM

## 2021-10-22 MED ORDER — CLONAZEPAM 0.5 MG PO TABS: 0.5000 mg | ORAL_TABLET | Freq: Two times a day (BID) | ORAL | 2 refills | Status: DC | PRN

## 2021-10-22 MED ORDER — VORTIOXETINE HBR 10 MG PO TABS: 10.0000 mg | ORAL_TABLET | Freq: Every day | ORAL | 2 refills | Status: DC

## 2021-10-22 MED ORDER — DULOXETINE HCL 60 MG PO CPEP: 60.0000 mg | ORAL_CAPSULE | Freq: Two times a day (BID) | ORAL | 2 refills | Status: DC

## 2021-10-22 MED ORDER — BELSOMRA 20 MG PO TABS: 20.0000 mg | ORAL_TABLET | Freq: Every day | ORAL | 2 refills | Status: DC

## 2021-10-22 NOTE — Progress Notes (Signed)
Virtual Visit via Telephone Note  I connected with Penny [name] on 10/22/21 at  9:20 AM EST by telephone and verified that I am speaking with the correct person using two identifiers.  Location: Patient: home Provider: office   I discussed the limitations, risks, security and privacy concerns of performing an evaluation and management service by telephone and the availability of in person appointments. I also discussed with the patient that there may be a patient responsible charge related to this service. The patient expressed understanding and agreed to proceed.      I discussed the assessment and treatment plan with the patient. The patient was provided an opportunity to ask questions and all were answered. The patient agreed with the plan and demonstrated an understanding of the instructions.   The patient was advised to call back or seek an in-person evaluation if the symptoms worsen or if the condition fails to improve as anticipated.  I provided 20 minutes of non-face-to-face time during this encounter.   [name], MD  [facility] MD/PA/NP OP Progress Note  10/22/2021 9:53 AM Penny [name]  MRN:  [id number]  Chief Complaint:  Chief Complaint   Depression; Anxiety; Follow-up    HPI: This patient is a 35 year old female who is now living alone in [place].  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  She has 2 children with whom she shares custody with their fathers.  The patient returns for follow-up after 4 weeks.  She states that she still feels like she is "in a funk."  She worries a lot about whether or not she is a good enough mother to her kids.  She does not have her kids all the time as they go back and forth with their fathers.  She is easily heard if they do not want to spend time with her.  She admitted that her boyfriend broke up with her about 4 weeks ago and this is been a below as well.  She feels lonely and does not like to be alone.  She denies any  thoughts of self-harm or suicidal ideation.  She is sleeping a little bit better with the Belsomra.  I suggested that we add another low-dose antidepressant such as Trintellix and she agrees. Visit Diagnosis:    ICD-10-CM   1. GAD (generalized anxiety disorder)  F41.1     2. Severe episode of recurrent major depressive disorder, without psychotic features ([place])  F33.2     3. Attention deficit hyperactivity disorder (ADHD), predominantly inattentive type  F90.0     4. 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.0       Past Psychiatric History: Long-term outpatient treatment, hospitalization last year for depression  Past Medical History:  Past Medical History:  Diagnosis Date   ADD (attention deficit disorder)    Anemia 2011   2o to GASTRIC BYPASS   Anginal pain ([place])    Anxiety    Asthma    Blood transfusion without reported diagnosis    Chronic daily headache    Depression    Depression    Dysmenorrhea 09/18/2013   Dysrhythmia    Elevated liver enzymes JUL 2011ALK PHOS 111-127 AST  143-267 ALT  213-321T BILI 0.6  ALB  3.7-4.06 Jun 2011 ALK PHOS 118 AST 24 ALT 42 T BILI 0.4 ALB 3.9   Encounter for drug rehabilitation    at behavioral health for opioid addiction about 4 years ago   Family history of adverse reaction to  anesthesia    'dad had to be kept on pump for breathing for morphine'   Fatty liver    Fibroid 01/18/2017   Fibromyalgia    Gastric bypass status for obesity    Gastritis JULY 2011   Heart murmur    Hereditary and idiopathic peripheral neuropathy 01/15/2015   History of Holter monitoring    Hx of opioid abuse ([place])    for about 4 years, about 4 years ago   Interstitial cystitis    Iron deficiency anemia 07/23/2010   Irritable bowel syndrome 2012 DIARRHEA   JUN 2012 TTG IgA 14.9   IUD FEB 2010   Lupus ([place])    Menorrhagia 07/18/2013   Migraines    Obesity (BMI 30-39.9) 2011 228 LBS BMI 36.8   Osteoporosis    Ovarian cyst    Patient desires pregnancy 09/18/2013    Polyneuropathy    PONV (postoperative nausea and vomiting) seizure post-operatively   seizure following ankle surgery   Potassium (K) deficiency    Pregnant 12/25/2013   Psychiatric pseudoseizure    RLQ abdominal pain 07/18/2013   Sciatica of left side 11/14/2014   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   Stress 09/18/2013    Past Surgical History:  Procedure Laterality Date   BIOPSY  04/10/2015   Procedure: BIOPSY;  Surgeon: Daneil Dolin, MD;  Location: [facility];  Service: Endoscopy;;   BIOPSY  04/15/2021   Procedure: BIOPSY;  Surgeon: Montez Morita, Quillian Quince, MD;  [facility];  Service: Gastroenterology;;   cath self every nite     for sodium bicarb injection (discontinued 2013)   CHOLECYSTECTOMY  2005   biliary dyskinesia   COLONOSCOPY  JUN 2012 ABD PN/DIARRHEA WITH PROPOFOL   NL COLON   COLONOSCOPY WITH PROPOFOL N/A 01/16/2021   Procedure: COLONOSCOPY WITH PROPOFOL;  Surgeon: Harvel Quale, MD;  [facility];  Service: Gastroenterology;  Laterality: N/A;  1:45   DILATION AND CURETTAGE OF UTERUS     DILITATION & CURRETTAGE/HYSTROSCOPY WITH NOVASURE ABLATION N/A 03/24/2017   Procedure: DILATATION & CURETTAGE/HYSTEROSCOPY WITH NOVASURE ENDOMETRIAL ABLATION;  Surgeon: Jonnie Kind, MD;  Location: [facility];  Service: Gynecology;  Laterality: N/A;   ESOPHAGEAL DILATION N/A 04/10/2015   Procedure: ESOPHAGEAL DILATION WITH 54FR MALONEY DILATOR;  Surgeon: Daneil Dolin, MD;  Location: [facility];  Service: Endoscopy;  Laterality: N/A;   ESOPHAGOGASTRODUODENOSCOPY     ESOPHAGOGASTRODUODENOSCOPY (EGD) WITH PROPOFOL N/A 04/10/2015   Procedure: ESOPHAGOGASTRODUODENOSCOPY (EGD) WITH PROPOFOL;  Surgeon: Daneil Dolin, MD;  Location[facility];  Service: Endoscopy;  Laterality: N/A;   ESOPHAGOGASTRODUODENOSCOPY (EGD) WITH PROPOFOL N/A 12/06/2016   Procedure: ESOPHAGOGASTRODUODENOSCOPY (EGD) WITH PROPOFOL;  Surgeon: Danie Binder, MD;  [facility];  Service:  Endoscopy;  Laterality: N/A;   ESOPHAGOGASTRODUODENOSCOPY (EGD) WITH PROPOFOL N/A 04/27/2019   Procedure: ESOPHAGOGASTRODUODENOSCOPY (EGD) WITH PROPOFOL;  Surgeon: Rogene Houston, MD;  [facility];  Service: Endoscopy;  Laterality: N/A;  730   ESOPHAGOGASTRODUODENOSCOPY (EGD) WITH PROPOFOL N/A 08/31/2019   Procedure: ESOPHAGOGASTRODUODENOSCOPY (EGD) WITH PROPOFOL;  Surgeon: Rogene Houston, MD;  [facility];  Service: Endoscopy;  Laterality: N/A;   ESOPHAGOGASTRODUODENOSCOPY (EGD) WITH PROPOFOL N/A 01/16/2021   Procedure: ESOPHAGOGASTRODUODENOSCOPY (EGD) WITH PROPOFOL;  Surgeon: Harvel Quale, MD;  [facility];  Service: Gastroenterology;  Laterality: N/A;   ESOPHAGOGASTRODUODENOSCOPY (EGD) WITH PROPOFOL N/A 04/15/2021   Procedure: ESOPHAGOGASTRODUODENOSCOPY (EGD) WITH PROPOFOL;  Surgeon: Harvel Quale, MD;  [facility]: [facility];  Service: Gastroenterology;  Laterality: N/A;  9:00 AM   GAB  2007   in [place]-POUCH 5 CM   GASTRIC BYPASS  06/2006   HYSTEROSCOPY WITH D & C N/A 09/12/2014   Procedure: DILATATION AND CURETTAGE /HYSTEROSCOPY;  Surgeon: Jonnie Kind, MD;  Location: [facility];  Service: Gynecology;  Laterality: N/A;   KYPHOPLASTY N/A 08/02/2018   Procedure: KYPHOPLASTY T12;  Surgeon: Melina Schools, MD;  Location: [facility];  Service: Orthopedics;  Laterality: N/A;  60 mins   LAPAROSCOPIC TUBAL LIGATION Bilateral 03/24/2017   Procedure: LAPAROSCOPIC TUBAL LIGATION ([place] Rings);  Surgeon: Jonnie Kind, MD;  Location: [facility];  Service: Gynecology;  Laterality: Bilateral;   REPAIR VAGINAL CUFF N/A 07/30/2014   Procedure: REPAIR VAGINAL CUFF;  Surgeon: Mora Bellman, MD;  Location: [facility];  Service: Gynecology;  Laterality: N/A;   SAVORY DILATION  06/20/2012   Dr. Barnie Alderman gastritis/Ulcer in the mid jejunum. Empiric dilation.    SURAL NERVE BX Left 02/25/2016   Procedure: LEFT SURAL NERVE BIOPSY;  Surgeon: Jovita Gamma,  MD;  Location: [facility];  Service: Neurosurgery;  Laterality: Left;  Left sural nerve biopsy   TONSILLECTOMY     TONSILLECTOMY AND ADENOIDECTOMY     UPPER GASTROINTESTINAL ENDOSCOPY  JULY 2011 NAUSEA-D125,V6, PH 25   Bx; GASTRITIS, POUCH-5 CM LONG   WISDOM TOOTH EXTRACTION      Family Psychiatric History: see below  Family History:  Family History  Problem Relation Age of Onset   Coronary artery disease Paternal Grandfather    Migraines Paternal Grandmother    Breast cancer Paternal Grandmother    Hemochromatosis Maternal Grandmother    Migraines Maternal Grandmother    Cancer Maternal Grandmother    Breast cancer Maternal Grandmother    Hypertension Father    Diabetes Father    Coronary artery disease Father    Cancer Mother        breast   Hemochromatosis Mother    Breast cancer Mother    Depression Mother    Anxiety disorder Mother    Anxiety disorder Brother    Bipolar disorder Brother    Healthy Daughter    Healthy Son     Social History:  Social History   Socioeconomic History   Marital status: Divorced    Spouse name: Not on file   Number of children: 2   Years of education: some college   Highest education level: Some college, no degree  Occupational History   Occupation: Applying for Social Security Disability    Employer: NOT EMPLOYED  Tobacco Use   Smoking status: Former    Packs/day: 0.25    Years: 6.00    Pack years: 1.50    Types: Cigarettes    Start date: 05/2018   Smokeless tobacco: Never  Vaping Use   Vaping Use: Some days  Substance and Sexual Activity   Alcohol use: Not Currently    Alcohol/week: 0.0 standard drinks   Drug use: Not Currently   Sexual activity: Yes    Partners: Male    Birth control/protection: Surgical, Condom    Comment: tubal and ablation  Other Topics Concern   Not on file  Social History Narrative   Patient is right handed.   Patient drinks one cup of coffee daily.   She lives in a one-story home with  her two children (37 year old son, 49 month old daughter).   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 [employer]    Social Determinants of Health   Financial Resource Strain: Medium Risk   Difficulty of Paying Living Expenses: Somewhat hard  Food Insecurity: Food Insecurity Present   Worried About Charity fundraiser in the Last Year: Sometimes true   Ran Out of Food in the Last Year: Sometimes true  Transportation Needs: No Transportation Needs   Lack of Transportation (Medical): No   Lack of Transportation (Non-Medical): No  Physical Activity: Inactive   Days of Exercise per Week: 0 days   Minutes of Exercise per Session: 10 min  Stress: Stress Concern Present   Feeling of Stress : Very much  Social Connections: Moderately Isolated   Frequency of Communication with Friends and Family: More than three times a week   Frequency of Social Gatherings with Friends and Family: Once a week   Attends Religious Services: 1 to 4 times per year   Active Member of [employer] or Organizations: No   Attends Archivist Meetings: Never   Marital Status: Divorced    Allergies:  Allergies  Allergen Reactions   Gabapentin Other (See Comments)    Pt states that she was unresponsive after taking this medication, but her vitals remained stable.     Metoclopramide Hcl Anxiety and Other (See Comments)    Pt states that she felt like she was trapped in a box, and could not get out.  Pt also states that she had temporary loss of movement, weakness, and tingling.     Tramadol Other (See Comments)    Seizures   Ativan [Lorazepam] Other (See Comments)    combative   Latex Itching, Rash and Other (See Comments)    Burns   Lyrica [Pregabalin] Other (See Comments)    suicidal   Iron Other (See Comments)    PATIENT IS INTOLERANT TO ORAL IRON , SHE DOES NOT ABSORB IT.     Sulfa Antibiotics Hives   Butrans [Buprenorphine] Rash    Patch caused rash, didn't help with  pain   Nucynta [Tapentadol] Nausea And Vomiting   Propofol Nausea And Vomiting   Tape Hives, Itching and Rash    Please use paper tape   Toradol [Ketorolac Tromethamine] Anxiety    Metabolic Disorder Labs: Lab Results  Component Value Date   HGBA1C 5.7 (H) 01/15/2015   Lab Results  Component Value Date   PROLACTIN 80.0 (H) 05/09/2015   Lab Results  Component Value Date   CHOL 95 03/10/2012   TRIG 50 03/10/2012   HDL 38 (L) 03/10/2012   CHOLHDL 2.5 03/10/2012   VLDL 10 03/10/2012   LDLCALC 47 03/10/2012   Lab Results  Component Value Date   TSH 1.350 01/01/2021   TSH 2.616 08/25/2019    Therapeutic Level Labs: No results found for: LITHIUM Lab Results  Component Value Date   VALPROATE 66 06/23/2020   No components found for:  CBMZ  Current Medications: Current Outpatient Medications  Medication Sig Dispense Refill   vortioxetine HBr (TRINTELLIX) 10 MG TABS tablet Take 1 tablet (10 mg total) by mouth daily. 30 tablet 2   amoxicillin-clavulanate (AUGMENTIN) 875-125 MG tablet Take 1 tablet by mouth 2 (two) times daily. 14 tablet 0   amphetamine-dextroamphetamine (ADDERALL XR) 30 MG 24 hr capsule Take 1 capsule (30 mg total) by mouth every morning. 30 capsule 0   benzonatate (TESSALON) 100 MG capsule Take 1-2 capsules (100-200 mg total) by mouth 3 (three) times daily as needed for  cough. 60 capsule 0   Calcium Carb-Cholecalciferol (CALCIUM-VITAMIN D3) 600-500 MG-UNIT CAPS Take 1 tablet by mouth daily.     Carboxymethylcellul-Glycerin (LUBRICATING EYE DROPS OP) Place 1 drop into both eyes daily as needed (dry eyes).     cetirizine (ZYRTEC ALLERGY) 10 MG tablet Take 1 tablet (10 mg total) by mouth daily. 30 tablet 0   clidinium-chlordiazePOXIDE (LIBRAX) 5-2.5 MG capsule Take 1 capsule by mouth 3 (three) times daily before meals. 90 capsule 5   clonazePAM (KLONOPIN) 0.5 MG tablet Take 1 tablet (0.5 mg total) by mouth 2 (two) times daily as needed. 60 tablet 2   DULoxetine  (CYMBALTA) 60 MG capsule Take 1 capsule (60 mg total) by mouth 2 (two) times daily. 180 capsule 2   Erenumab-aooe (AIMOVIG) 140 MG/ML SOAJ Inject 140 mg into the skin every 28 (twenty-eight) days. 1 mL 11   fluconazole (DIFLUCAN) 150 MG tablet Take 1 tablet (150 mg total) by mouth once a week. 2 tablet 0   fluticasone (FLOVENT HFA) 220 MCG/ACT inhaler Inhale 2 puffs into the lungs 2 (two) times daily. Do not inhale, swallow powder and drink water , do not eat food for 20 minutes after puffs 4 each 2   HYDROmorphone (DILAUDID) 4 MG tablet Take 4 mg by mouth every 4 (four) hours as needed for severe pain.     lansoprazole (PREVACID) 30 MG capsule Take 1 capsule (30 mg total) by mouth daily. 180 capsule 1   levalbuterol (XOPENEX HFA) 45 MCG/ACT inhaler INHALE (2) PUFFS INTO THE LUNGS EVERY SIX HOURS AS NEEDED FOR WHEEZING. (Patient taking differently: Inhale 2 puffs into the lungs every 6 (six) hours as needed for wheezing or shortness of breath. INHALE (2) PUFFS INTO THE LUNGS EVERY SIX HOURS AS NEEDED FOR WHEEZING.) 15 g 3   levalbuterol (XOPENEX) 1.25 MG/3ML nebulizer solution Take 1.25 mg by nebulization every 4 (four) hours as needed for wheezing. 50 mL 3   linaclotide (LINZESS) 290 MCG CAPS capsule Take 1 capsule (290 mcg total) by mouth daily before breakfast. 90 capsule 3   meclizine (ANTIVERT) 25 MG tablet Take 1 tablet (25 mg total) by mouth 3 (three) times daily as needed for dizziness. 30 tablet 0   metroNIDAZOLE (METROGEL VAGINAL) 0.75 % vaginal gel Place 1 Applicatorful vaginally at bedtime. 70 g 0   pantoprazole (PROTONIX) 40 MG tablet Take 1 tablet (40 mg total) by mouth 2 (two) times daily before a meal. 60 tablet 5   Pediatric Multivit-Minerals-C (FLINTSTONES GUMMIES) chewable tablet Chew 3 tablets by mouth daily.     promethazine (PHENERGAN) 12.5 MG tablet TAKE 1 TABLET BY MOUTH EVERY 8 HOURS AS NEEDED FOR NAUSEA OR VOMITING 30 tablet 0   promethazine (PHENERGAN) 25 MG tablet Take 25 mg  by mouth 2 (two) times daily as needed for nausea or vomiting.     promethazine-dextromethorphan (PROMETHAZINE-DM) 6.25-15 MG/5ML syrup Take 5 mLs by mouth at bedtime as needed for cough. 100 mL 0   pseudoephedrine (SUDAFED) 60 MG tablet Take 1 tablet (60 mg total) by mouth every 8 (eight) hours as needed for congestion. 30 tablet 0   Rimegepant Sulfate (NURTEC) 75 MG TBDP Take 1 tablet as needed for severe migraine.  Limit to twice per week. 10 tablet 3   Suvorexant (BELSOMRA) 20 MG TABS Take 20 mg by mouth at bedtime. 30 tablet 2   tiZANidine (ZANAFLEX) 4 MG tablet Take 4 mg by mouth 3 (three) times daily as needed.  Vitamin D, Ergocalciferol, (DRISDOL) 1.25 MG (50000 UNIT) CAPS capsule Take 50,000 Units by mouth every Wednesday.     No current facility-administered medications for this visit.     Musculoskeletal: Strength & Muscle Tone: na Gait & Station: na Patient leans: N/A  Psychiatric Specialty Exam: Review of Systems  Musculoskeletal:  Positive for back pain.  Psychiatric/Behavioral:  Positive for dysphoric mood. The patient is nervous/anxious.   All other systems reviewed and are negative.  There were no vitals taken for this visit.There is no height or weight on file to calculate BMI.  General Appearance: NA  Eye Contact:  NA  Speech:  Clear and Coherent  Volume:  Normal  Mood:  Anxious and Depressed  Affect:  NA  Thought Process:  Goal Directed  Orientation:  Full (Time, Place, and Person)  Thought Content: Rumination   Suicidal Thoughts:  No  Homicidal Thoughts:  No  Memory:  Immediate;   Good Recent;   Good Remote;   Good  Judgement:  Good  Insight:  Fair  Psychomotor Activity:  Decreased  Concentration:  Concentration: Good and Attention Span: Good  Recall:  Good  Fund of Knowledge: Good  Language: Good  Akathisia:  No  Handed:  Right  AIMS (if indicated): not done  Assets:  Communication Skills Desire for Improvement Resilience Social  Support Talents/Skills  ADL's:  Intact  Cognition: WNL  Sleep:  Fair   Screenings: AIMS    Flowsheet Row Admission (Discharged) from 06/19/2020 in [facility]  AIMS Total Score 0      AUDIT    Flowsheet Row Patient Outreach Telephone from 07/14/2020 in [facility] Admission (Discharged) from 06/19/2020 in [facility] ED to Hosp-Admission (Discharged) from 05/09/2015 in [facility]  Alcohol Use Disorder Identification Test Final Score (AUDIT) 1 1 0      GAD-7    Flowsheet Row Office Visit from 08/25/2021 in [facility] from 12/11/2018 in [facility] from 02/07/2017 in [facility]  Total GAD-7 Score _0 PHQ2-9    Flowsheet Row Video Visit from 10/22/2021 in [facility] Video Visit from 09/24/2021 in [facility] Video Visit from 09/07/2021 in [facility] Office Visit from 08/25/2021 in [facility] Video Visit from 07/13/2021 in [facility] [facility]  PHQ-2 Total Score _1 PHQ-9 Total Score _2 Flowsheet Row Video Visit from 10/22/2021 in [facility] ED from 10/04/2021 in [facility] Video Visit from 09/24/2021 in [facility]  C-SSRS RISK CATEGORY No Risk No Risk No Risk        Assessment and Plan: This patient is a 35 year old female with a history of anxiety depression ADHD pseudoseizures borderline personality disorder and insomnia.  She is sleeping somewhat better with the Belsomra 20 mg at bedtime so this will be continued.  She  will continue Cymbalta 120 mg daily for depression but add Trintellix 10 mg as well since she is still having a fair amount of depression.  She will continue Adderall XR 30 mg every morning for focus and clonazepam 0.5 mg twice daily as needed for anxiety.  She will return to see  me in 4 weeks.  I will again try her to get her into one of our intensive outpatient programs.   [name], MD 10/22/2021, 9:53 AM

## 2021-10-26 ENCOUNTER — Ambulatory Visit (INDEPENDENT_AMBULATORY_CARE_PROVIDER_SITE_OTHER): Payer: Medicaid Other | Admitting: Clinical

## 2021-10-26 ENCOUNTER — Other Ambulatory Visit: Payer: Self-pay

## 2021-10-26 DIAGNOSIS — F411 Generalized anxiety disorder: Secondary | ICD-10-CM

## 2021-10-26 DIAGNOSIS — F332 Major depressive disorder, recurrent severe without psychotic features: Secondary | ICD-10-CM | POA: Diagnosis not present

## 2021-10-26 NOTE — Progress Notes (Signed)
Virtual Visit via Telephone Note   I connected with Penny [name] on 10/26/21 at  2:00 PM EDT by telephone and verified that I am speaking with the correct person using two identifiers.   Location: Patient: Home Provider: Office   I discussed the limitations, risks, security and privacy concerns of performing an evaluation and management service by telephone and the availability of in person appointments. I also discussed with the patient that there may be a patient responsible charge related to this service. The patient expressed understanding and agreed to proceed.     THERAPIST PROGRESS NOTE   Session Time: 2:00PM-2:45PM   Participation Level: Active   Behavioral Response: CasualAlertDepressed and NA   Type of Therapy: Individual Therapy   Treatment Goals addressed: Coping   Interventions: CBT, Motivational Interviewing, Strength-based and Supportive   Summary: Penny [name] is a 35 y.o. female who presents with GAD and MDD. The OPT therapist worked with the patient for her ongoing OPT treatment session. The OPT therapist utilized Motivational Interviewing to assist in creating therapeutic repore. The patient in the session was engaged and work in collaboration giving feedback about her triggers and symptoms over the past few weeks. The patient spoke about interaction with her ex-husband and collaboration for scheduling for the upcoming holidays.  The patient spoke about plans for upcoming including work to clean the home, her upcoming birthday, and Thanksgiving/Christmas holidays.The OPT therapist worked with the patient implementing elements of CBT. The OPT therapist provided psycho-education during the session. The OPT therapist worked with the patient on using her supports and communicating her feelings and focusing in areas that she has control in. The OPT therapist reviewed with the patient her upcoming health appointments. The patient spoke about her willingness to challenge negative  thinking and not allow external factors she cannot control to escalate in its impact on her mental health.   Suicidal/Homicidal: Nowithout intent/plan   Therapist Response: The OPT therapist worked with the patient for the patients scheduled session. The patient was engaged in her session and gave feedback in relation to triggers, symptoms, and behavior responses over the past few weeks. The patient has been having difficulty managing symptoms of her GAD and MDD . The OPT therapist in session worked with the patient and utilized elements of CBT in working with the patient to help empower the patient to set healthy boundaries and put her effort into areas she has control to make decisions about. The OPT therapist worked with the patient on, positive thinking, communication of her feelings and not allowing automatic negative thoughts dominate, and use her coping strategies. The patient spoke about improvement in the home in her interactions with her son and collaboration working well between the patient and her ex. The patient worked with the [facility] therapist reviewing in home work for effective communication and mood/emotion control for the patient and her kids at home.The OPT therapist will continue to work with the patient for her ongoing OPT treatment.   Plan: Return again in 2/3 weeks.   Diagnosis:      Axis I: Depression and GAD                           Axis II: No diagnosis   I discussed the assessment and treatment plan with the patient. The patient was provided an opportunity to ask questions and all were answered. The patient agreed with the plan and demonstrated an understanding of the instructions.  The patient was advised to call back or seek an in-person evaluation if the symptoms worsen or if the condition fails to improve as anticipated.   I provided 45 minutes of non-face-to-face time during this encounter.   Lennox Grumbles, LCSW   10/26/2021

## 2021-11-04 ENCOUNTER — Telehealth (HOSPITAL_COMMUNITY): Payer: Self-pay | Admitting: Professional

## 2021-11-10 ENCOUNTER — Ambulatory Visit (HOSPITAL_COMMUNITY): Payer: Medicaid Other

## 2021-11-12 ENCOUNTER — Telehealth (HOSPITAL_COMMUNITY): Payer: Self-pay | Admitting: Professional

## 2021-11-12 ENCOUNTER — Ambulatory Visit (HOSPITAL_COMMUNITY): Payer: Medicaid Other | Admitting: Professional

## 2021-11-12 DIAGNOSIS — F332 Major depressive disorder, recurrent severe without psychotic features: Secondary | ICD-10-CM

## 2021-11-16 ENCOUNTER — Telehealth (HOSPITAL_COMMUNITY): Payer: Self-pay | Admitting: Professional

## 2021-11-17 ENCOUNTER — Telehealth (HOSPITAL_COMMUNITY): Payer: Self-pay | Admitting: Professional

## 2021-11-18 ENCOUNTER — Telehealth (HOSPITAL_COMMUNITY): Payer: Self-pay | Admitting: Professional
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
## 2021-11-20 ENCOUNTER — Other Ambulatory Visit: Payer: Self-pay | Admitting: Family Medicine

## 2021-11-20 ENCOUNTER — Other Ambulatory Visit (HOSPITAL_COMMUNITY): Payer: Self-pay | Admitting: Psychiatry

## 2021-11-24 DIAGNOSIS — G8929 Other chronic pain: Secondary | ICD-10-CM | POA: Diagnosis not present

## 2021-11-24 DIAGNOSIS — M5441 Lumbago with sciatica, right side: Secondary | ICD-10-CM | POA: Diagnosis not present

## 2021-11-24 DIAGNOSIS — G40909 Epilepsy, unspecified, not intractable, without status epilepticus: Secondary | ICD-10-CM | POA: Diagnosis not present

## 2021-11-24 DIAGNOSIS — Z79899 Other long term (current) drug therapy: Secondary | ICD-10-CM | POA: Diagnosis not present

## 2021-11-24 DIAGNOSIS — M5442 Lumbago with sciatica, left side: Secondary | ICD-10-CM | POA: Diagnosis not present

## 2021-11-25 ENCOUNTER — Ambulatory Visit (INDEPENDENT_AMBULATORY_CARE_PROVIDER_SITE_OTHER): Payer: Medicaid Other | Admitting: Clinical

## 2021-11-25 ENCOUNTER — Other Ambulatory Visit: Payer: Self-pay

## 2021-11-25 DIAGNOSIS — F332 Major depressive disorder, recurrent severe without psychotic features: Secondary | ICD-10-CM

## 2021-11-25 DIAGNOSIS — F411 Generalized anxiety disorder: Secondary | ICD-10-CM

## 2021-11-25 NOTE — Progress Notes (Signed)
Virtual Visit via Telephone Note   I connected with Penny [name] on 11/25/21 at  3:00 PM EDT by telephone and verified that I am speaking with the correct person using two identifiers.   Location: Patient: Home Provider: Office   I discussed the limitations, risks, security and privacy concerns of performing an evaluation and management service by telephone and the availability of in person appointments. I also discussed with the patient that there may be a patient responsible charge related to this service. The patient expressed understanding and agreed to proceed.     THERAPIST PROGRESS NOTE   Session Time: 3:00 PM-3:55 PM   Participation Level: Active   Behavioral Response: CasualAlertDepressed and NA   Type of Therapy: Individual Therapy   Treatment Goals addressed: Coping   Interventions: CBT, Motivational Interviewing, Strength-based and Supportive   Summary: Penny [name] is a 35 y.o. female who presents with GAD and MDD. The OPT therapist worked with the patient for her ongoing OPT treatment session. The OPT therapist utilized Motivational Interviewing to assist in creating therapeutic repore. The patient in the session was engaged and work in collaboration giving feedback about her triggers and symptoms over the past few weeks. The patient spoke about noticing a dip and then rise in her depression and anxiety suggestive of a cycling episode.  The patient spoke about her difficulty with her fiance messaging and ending their relationship noting he wanted to focus more on his children.The patient spoke about her difficulty in adjusting to changes that are present for her this Christmas holiday season.The OPT therapist worked with the patient implementing elements of CBT. The OPT therapist provided psycho-education during the session. The OPT therapist worked with the patient on using her supports and communicating her feelings and focusing in areas that she has control in. The patient  has been working on managing her emotions and not allowing it to carryover in front of her children.The patient noted due to her seizures her Mother now managing her medication and improving consistency.The patient displayed instances throughout the session of blanking out, losing her train of thought, and noted difficulty with memory.The OPT therapist reviewed with the patient her upcoming health appointments. The patient spoke about her willingness to challenge negative thinking and not allow external factors she cannot control to escalate in its impact on her mental health.   Suicidal/Homicidal: Nowithout intent/plan   Therapist Response: The OPT therapist worked with the patient for the patients scheduled session. The patient was engaged in her session and gave feedback in relation to triggers, symptoms, and behavior responses over the past few weeks. The patient has been having difficulty managing symptoms of her GAD and MDD . The OPT therapist in session worked with the patient and utilized elements of CBT in working with the patient to help empower the patient to set healthy boundaries and put her effort into areas she has control to make decisions about. The OPT therapist worked with the patient on, positive thinking, communication of her feelings and not allowing automatic negative thoughts dominate, and use her coping strategies. The patient spoke about improvement in the home in her interactions with her son and collaboration working well between the patient and her ex. The patient worked with the [place] therapist reviewing in home work for effective communication and mood/emotion control for the patient and her kids  when at home.The patient spoke about transition to IOP in January 2023.The OPT therapist will continue to work with the patient for her ongoing OPT  treatment unless the patient transitions to IOP.   Plan: Return again in 2/3 weeks.   Diagnosis:      Axis I: Depression and GAD                            Axis II: No diagnosis   I discussed the assessment and treatment plan with the patient. The patient was provided an opportunity to ask questions and all were answered. The patient agreed with the plan and demonstrated an understanding of the instructions.   The patient was advised to call back or seek an in-person evaluation if the symptoms worsen or if the condition fails to improve as anticipated.   I provided 55 minutes of non-face-to-face time during this encounter.   Lennox Grumbles, LCSW   11/25/2021

## 2021-11-26 DIAGNOSIS — Z79899 Other long term (current) drug therapy: Secondary | ICD-10-CM | POA: Diagnosis not present

## 2021-12-02 ENCOUNTER — Other Ambulatory Visit: Payer: Self-pay | Admitting: Family Medicine

## 2021-12-03 ENCOUNTER — Ambulatory Visit (INDEPENDENT_AMBULATORY_CARE_PROVIDER_SITE_OTHER): Payer: Medicaid Other | Admitting: Clinical

## 2021-12-03 ENCOUNTER — Other Ambulatory Visit: Payer: Self-pay

## 2021-12-03 DIAGNOSIS — F332 Major depressive disorder, recurrent severe without psychotic features: Secondary | ICD-10-CM

## 2021-12-03 DIAGNOSIS — F411 Generalized anxiety disorder: Secondary | ICD-10-CM

## 2021-12-03 NOTE — Progress Notes (Signed)
Virtual Visit via Telephone Note   I connected with Penny [name] on 12/03/21 at  9:00 AM EDT by telephone and verified that I am speaking with the correct person using two identifiers.   Location: Patient: Home Provider: Office   I discussed the limitations, risks, security and privacy concerns of performing an evaluation and management service by telephone and the availability of in person appointments. I also discussed with the patient that there may be a patient responsible charge related to this service. The patient expressed understanding and agreed to proceed.     THERAPIST PROGRESS NOTE   Session Time: 9:00 AM-9:45 AM   Participation Level: Active   Behavioral Response: CasualAlertDepressed and NA   Type of Therapy: Individual Therapy   Treatment Goals addressed: Coping   Interventions: CBT, Motivational Interviewing, Strength-based and Supportive   Summary: Penny [name] is a 35 y.o. female who presents with GAD and MDD. The OPT therapist worked with the patient for her ongoing OPT treatment session. The OPT therapist utilized Motivational Interviewing to assist in creating therapeutic repore. The patient in the session was engaged and work in collaboration giving feedback about her triggers and symptoms over the past few weeks. The patient spoke about her Christmas holiday experience.The patient spoke about her brother and dad being in conflict and this being a stressor in the family.The OPT therapist worked with the patient implementing elements of CBT. The OPT therapist provided psycho-education during the session. The OPT therapist worked with the patient on using her supports and communicating her feelings and focusing in areas that she has control in. The patient has been working on managing her emotions and not allowing it to carryover in front of her children.The patient noted due to her seizures her Mother now managing her medication and improving consistency.The patient  noted ongoing difficulty with memory and in the session, however, seemed better able to stay on the same subject.The OPT therapist reviewed with the patient her upcoming health appointments. The patient spoke about her willingness to challenge negative thinking and not allow external factors she cannot control to escalate in its impact on her mental health. The patient spoke about frustration with inconsistency in trying to raise her son in a split family situation.   Suicidal/Homicidal: Nowithout intent/plan   Therapist Response: The OPT therapist worked with the patient for the patients scheduled session. The patient was engaged in her session and gave feedback in relation to triggers, symptoms, and behavior responses over the past few weeks. The patient has been having difficulty managing symptoms of her GAD and MDD . The OPT therapist in session worked with the patient and utilized elements of CBT in working with the patient to help empower the patient to set healthy boundaries and put her effort into areas she has control to make decisions about. The OPT therapist worked with the patient on, positive thinking, communication of her feelings and not allowing automatic negative thoughts dominate, and use her coping strategies. The patient spoke about improvement in sticking to her boundaries more strongly and this being a ongoing goal and work in progress for the patient. The patient spoke about feeling pressure over the holiday from her Mother to talk with her brother, however, the patient identified she is not at a place where she is ready to have a serious conversation with her brother. The patient spoke about moving into a new year with 2023. The OPT therapist will continue to work with the patient for her ongoing OPT treatment.  Plan: Return again in 2/3 weeks.   Diagnosis:      Axis I: Depression and GAD                           Axis II: No diagnosis   I discussed the assessment and treatment  plan with the patient. The patient was provided an opportunity to ask questions and all were answered. The patient agreed with the plan and demonstrated an understanding of the instructions.   The patient was advised to call back or seek an in-person evaluation if the symptoms worsen or if the condition fails to improve as anticipated.   I provided 45 minutes of non-face-to-face time during this encounter.   Lennox Grumbles, LCSW   12/03/2021

## 2021-12-07 ENCOUNTER — Other Ambulatory Visit (HOSPITAL_COMMUNITY): Payer: Self-pay | Admitting: Psychiatry

## 2021-12-07 MED ORDER — AMPHETAMINE-DEXTROAMPHET ER 30 MG PO CP24: 30.0000 mg | ORAL_CAPSULE | ORAL | 0 refills | Status: DC

## 2021-12-09 ENCOUNTER — Encounter (HOSPITAL_COMMUNITY): Payer: Self-pay | Admitting: Psychiatry

## 2021-12-09 ENCOUNTER — Other Ambulatory Visit: Payer: Self-pay

## 2021-12-09 ENCOUNTER — Telehealth (INDEPENDENT_AMBULATORY_CARE_PROVIDER_SITE_OTHER): Payer: Medicaid Other | Admitting: Psychiatry

## 2021-12-09 DIAGNOSIS — F900 Attention-deficit hyperactivity disorder, predominantly inattentive type: Secondary | ICD-10-CM

## 2021-12-09 DIAGNOSIS — F411 Generalized anxiety disorder: Secondary | ICD-10-CM

## 2021-12-09 DIAGNOSIS — F332 Major depressive disorder, recurrent severe without psychotic features: Secondary | ICD-10-CM

## 2021-12-09 MED ORDER — VORTIOXETINE HBR 10 MG PO TABS: 10.0000 mg | ORAL_TABLET | Freq: Every day | ORAL | 2 refills | Status: DC

## 2021-12-09 MED ORDER — TRAZODONE HCL 100 MG PO TABS: 200.0000 mg | ORAL_TABLET | Freq: Every day | ORAL | 2 refills | Status: DC

## 2021-12-09 MED ORDER — CLONAZEPAM 0.5 MG PO TABS: 0.5000 mg | ORAL_TABLET | Freq: Two times a day (BID) | ORAL | 2 refills | Status: DC | PRN

## 2021-12-09 MED ORDER — AMPHETAMINE-DEXTROAMPHET ER 30 MG PO CP24: 30.0000 mg | ORAL_CAPSULE | ORAL | 0 refills | Status: DC

## 2021-12-09 MED ORDER — DULOXETINE HCL 60 MG PO CPEP: 60.0000 mg | ORAL_CAPSULE | Freq: Two times a day (BID) | ORAL | 2 refills | Status: DC

## 2021-12-09 NOTE — Progress Notes (Signed)
Virtual Visit via Telephone Note  I connected with Penny [name] on 12/09/21 at 11:20 AM EST by telephone and verified that I am speaking with the correct person using two identifiers.  Location: Patient: home Provider: office   I discussed the limitations, risks, security and privacy concerns of performing an evaluation and management service by telephone and the availability of in person appointments. I also discussed with the patient that there may be a patient responsible charge related to this service. The patient expressed understanding and agreed to proceed.     I discussed the assessment and treatment plan with the patient. The patient was provided an opportunity to ask questions and all were answered. The patient agreed with the plan and demonstrated an understanding of the instructions.   The patient was advised to call back or seek an in-person evaluation if the symptoms worsen or if the condition fails to improve as anticipated.  I provided 15 minutes of non-face-to-face time during this encounter.   Penny Spiller, MD  [facility] MD/PA/NP OP Progress Note  12/09/2021 11:50 AM Penny [name]  MRN:  [id number]  Chief Complaint:  Chief Complaint   Depression; Anxiety; Follow-up    HPI: This patient is a 36 year old female who is now living alone in [place].  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  She has 2 children with whom she shares custody with their fathers.  The patient returns for follow-up after about 6 weeks.  Last time we did add Trintellix but at the same time she states that she was started on Subutex by pain management.  She developed severe migraine headaches and she is not sure which drug is caused it.  She stopped the Subutex and her headache is a little bit better but she is still having it daily.  I advised that it may be she could cut the Trintellix from 10 to 5 mg.  On the positive side her mood seems to be better and she is bit more active.  She  stopped the Belsomra because it made her too drowsy the next day and she wants to go back on the trazodone.  Does not sound as sad as she has been in the past and she denies suicidal ideation Visit Diagnosis:    ICD-10-CM   1. GAD (generalized anxiety disorder)  F41.1     2. Severe episode of recurrent major depressive disorder, without psychotic features ([place])  F33.2     3. Attention deficit hyperactivity disorder (ADHD), predominantly inattentive type  F90.0       Past Psychiatric History: Long-term outpatient treatment, hospitalization last year for depression  Past Medical History:  Past Medical History:  Diagnosis Date   ADD (attention deficit disorder)    Anemia 2011   2o to GASTRIC BYPASS   Anginal pain ([place])    Anxiety    Asthma    Blood transfusion without reported diagnosis    Chronic daily headache    Depression    Depression    Dysmenorrhea 09/18/2013   Dysrhythmia    Elevated liver enzymes JUL 2011ALK PHOS 111-127 AST  143-267 ALT  213-321T BILI 0.6  ALB  3.7-4.06 Jun 2011 ALK PHOS 118 AST 24 ALT 42 T BILI 0.4 ALB 3.9   Encounter for drug rehabilitation    at behavioral health for opioid addiction about 4 years ago   Family history of adverse reaction to anesthesia    'dad had to be kept on  pump for breathing for morphine'   Fatty liver    Fibroid 01/18/2017   Fibromyalgia    Gastric bypass status for obesity    Gastritis JULY 2011   Heart murmur    Hereditary and idiopathic peripheral neuropathy 01/15/2015   History of Holter monitoring    Hx of opioid abuse ([place])    for about 4 years, about 4 years ago   Interstitial cystitis    Iron deficiency anemia 07/23/2010   Irritable bowel syndrome 2012 DIARRHEA   JUN 2012 TTG IgA 14.9   IUD FEB 2010   Lupus ([place])    Menorrhagia 07/18/2013   Migraines    Obesity (BMI 30-39.9) 2011 228 LBS BMI 36.8   Osteoporosis    Ovarian cyst    Patient desires pregnancy 09/18/2013   Polyneuropathy    PONV (postoperative  nausea and vomiting) seizure post-operatively   seizure following ankle surgery   Potassium (K) deficiency    Pregnant 12/25/2013   Psychiatric pseudoseizure    RLQ abdominal pain 07/18/2013   Sciatica of left side 11/14/2014   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   Stress 09/18/2013    Past Surgical History:  Procedure Laterality Date   BIOPSY  04/10/2015   Procedure: BIOPSY;  Surgeon: Daneil Dolin, MD;  Location: [facility];  Service: Endoscopy;;   BIOPSY  04/15/2021   Procedure: BIOPSY;  Surgeon: Montez Morita, Quillian Quince, MD;  [facility];  Service: Gastroenterology;;   cath self every nite     for sodium bicarb injection (discontinued 2013)   CHOLECYSTECTOMY  2005   biliary dyskinesia   COLONOSCOPY  JUN 2012 ABD PN/DIARRHEA WITH PROPOFOL   NL COLON   COLONOSCOPY WITH PROPOFOL N/A 01/16/2021   Procedure: COLONOSCOPY WITH PROPOFOL;  Surgeon: Harvel Quale, MD;  [facility];  Service: Gastroenterology;  Laterality: N/A;  1:45   DILATION AND CURETTAGE OF UTERUS     DILITATION & CURRETTAGE/HYSTROSCOPY WITH NOVASURE ABLATION N/A 03/24/2017   Procedure: DILATATION & CURETTAGE/HYSTEROSCOPY WITH NOVASURE ENDOMETRIAL ABLATION;  Surgeon: Jonnie Kind, MD;  Location: [facility];  Service: Gynecology;  Laterality: N/A;   ESOPHAGEAL DILATION N/A 04/10/2015   Procedure: ESOPHAGEAL DILATION WITH 54FR MALONEY DILATOR;  Surgeon: Daneil Dolin, MD;  Location: [facility];  Service: Endoscopy;  Laterality: N/A;   ESOPHAGOGASTRODUODENOSCOPY     ESOPHAGOGASTRODUODENOSCOPY (EGD) WITH PROPOFOL N/A 04/10/2015   Procedure: ESOPHAGOGASTRODUODENOSCOPY (EGD) WITH PROPOFOL;  Surgeon: Daneil Dolin, MD;  [facility];  Service: Endoscopy;  Laterality: N/A;   ESOPHAGOGASTRODUODENOSCOPY (EGD) WITH PROPOFOL N/A 12/06/2016   Procedure: ESOPHAGOGASTRODUODENOSCOPY (EGD) WITH PROPOFOL;  Surgeon: Danie Binder, MD;  [facility];  Service: Endoscopy;  Laterality: N/A;    ESOPHAGOGASTRODUODENOSCOPY (EGD) WITH PROPOFOL N/A 04/27/2019   Procedure: ESOPHAGOGASTRODUODENOSCOPY (EGD) WITH PROPOFOL;  Surgeon: Rogene Houston, MD;  [facility];  Service: Endoscopy;  Laterality: N/A;  730   ESOPHAGOGASTRODUODENOSCOPY (EGD) WITH PROPOFOL N/A 08/31/2019   Procedure: ESOPHAGOGASTRODUODENOSCOPY (EGD) WITH PROPOFOL;  Surgeon: Rogene Houston, MD;  [facility];  Service: Endoscopy;  Laterality: N/A;   ESOPHAGOGASTRODUODENOSCOPY (EGD) WITH PROPOFOL N/A 01/16/2021   Procedure: ESOPHAGOGASTRODUODENOSCOPY (EGD) WITH PROPOFOL;  Surgeon: Harvel Quale, MD;  [facility];  Service: Gastroenterology;  Laterality: N/A;   ESOPHAGOGASTRODUODENOSCOPY (EGD) WITH PROPOFOL N/A 04/15/2021   Procedure: ESOPHAGOGASTRODUODENOSCOPY (EGD) WITH PROPOFOL;  Surgeon: Harvel Quale, MD;  [facility];  Service: Gastroenterology;  Laterality: N/A;  9:00  AM   GAB  2007   in [place]-POUCH 5 CM   GASTRIC BYPASS  06/2006   HYSTEROSCOPY WITH D & C N/A 09/12/2014   Procedure: DILATATION AND CURETTAGE /HYSTEROSCOPY;  Surgeon: Jonnie Kind, MD;  Location: [facility];  Service: Gynecology;  Laterality: N/A;   KYPHOPLASTY N/A 08/02/2018   Procedure: KYPHOPLASTY T12;  Surgeon: Melina Schools, MD;  Location: [place];  Service: Orthopedics;  Laterality: N/A;  60 mins   LAPAROSCOPIC TUBAL LIGATION Bilateral 03/24/2017   Procedure: LAPAROSCOPIC TUBAL LIGATION ([place] Rings);  Surgeon: Jonnie Kind, MD;  Location: [facility];  Service: Gynecology;  Laterality: Bilateral;   REPAIR VAGINAL CUFF N/A 07/30/2014   Procedure: REPAIR VAGINAL CUFF;  Surgeon: Mora Bellman, MD;  Location: [facility];  Service: Gynecology;  Laterality: N/A;   SAVORY DILATION  06/20/2012   Dr. Barnie Alderman gastritis/Ulcer in the mid jejunum. Empiric dilation.    SURAL NERVE BX Left 02/25/2016   Procedure: LEFT SURAL NERVE BIOPSY;  Surgeon: Jovita Gamma, MD;  Location: [facility];   Service: Neurosurgery;  Laterality: Left;  Left sural nerve biopsy   TONSILLECTOMY     TONSILLECTOMY AND ADENOIDECTOMY     UPPER GASTROINTESTINAL ENDOSCOPY  JULY 2011 NAUSEA-D125,V6, PH 25   Bx; GASTRITIS, POUCH-5 CM LONG   WISDOM TOOTH EXTRACTION      Family Psychiatric History: see below  Family History:  Family History  Problem Relation Age of Onset   Coronary artery disease Paternal Grandfather    Migraines Paternal Grandmother    Breast cancer Paternal Grandmother    Hemochromatosis Maternal Grandmother    Migraines Maternal Grandmother    Cancer Maternal Grandmother    Breast cancer Maternal Grandmother    Hypertension Father    Diabetes Father    Coronary artery disease Father    Cancer Mother        breast   Hemochromatosis Mother    Breast cancer Mother    Depression Mother    Anxiety disorder Mother    Anxiety disorder Brother    Bipolar disorder Brother    Healthy Daughter    Healthy Son     Social History:  Social History   Socioeconomic History   Marital status: Divorced    Spouse name: Not on file   Number of children: 2   Years of education: some college   Highest education level: Some college, no degree  Occupational History   Occupation: Applying for Social Security Disability    Employer: NOT EMPLOYED  Tobacco Use   Smoking status: Former    Packs/day: 0.25    Years: 6.00    Pack years: 1.50    Types: Cigarettes    Start date: 05/2018   Smokeless tobacco: Never  Vaping Use   Vaping Use: Some days  Substance and Sexual Activity   Alcohol use: Not Currently    Alcohol/week: 0.0 standard drinks   Drug use: Not Currently   Sexual activity: Yes    Partners: Male    Birth control/protection: Surgical, Condom    Comment: tubal and ablation  Other Topics Concern   Not on file  Social History Narrative   Patient is right handed.   Patient drinks one cup of coffee daily.   She lives in a one-story home with her two children (74 year old  son, 66 month old daughter).   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 [employer]  Social Determinants of Health   Financial Resource Strain: Medium Risk   Difficulty of Paying Living Expenses: Somewhat hard  Food Insecurity: Food Insecurity Present   Worried About [place] in the Last Year: Sometimes true   Ran Out of Food in the Last Year: Sometimes true  Transportation Needs: No Transportation Needs   Lack of Transportation (Medical): No   Lack of Transportation (Non-Medical): No  Physical Activity: Inactive   Days of Exercise per Week: 0 days   Minutes of Exercise per Session: 10 min  Stress: Stress Concern Present   Feeling of Stress : Very much  Social Connections: Moderately Isolated   Frequency of Communication with Friends and Family: More than three times a week   Frequency of Social Gatherings with Friends and Family: Once a week   Attends Religious Services: 1 to 4 times per year   Active Member of [employer] or Organizations: No   Attends Archivist Meetings: Never   Marital Status: Divorced    Allergies:  Allergies  Allergen Reactions   Gabapentin Other (See Comments)    Pt states that she was unresponsive after taking this medication, but her vitals remained stable.     Metoclopramide Hcl Anxiety and Other (See Comments)    Pt states that she felt like she was trapped in a box, and could not get out.  Pt also states that she had temporary loss of movement, weakness, and tingling.     Tramadol Other (See Comments)    Seizures   Ativan [Lorazepam] Other (See Comments)    combative   Latex Itching, Rash and Other (See Comments)    [place]   Lyrica [Pregabalin] Other (See Comments)    suicidal   Iron Other (See Comments)    PATIENT IS INTOLERANT TO ORAL IRON , SHE DOES NOT ABSORB IT.     Sulfa Antibiotics Hives   Butrans [Buprenorphine] Rash    Patch caused rash, didn't help with pain   Nucynta [Tapentadol]  Nausea And Vomiting   Propofol Nausea And Vomiting   Tape Hives, Itching and Rash    Please use paper tape   Toradol [Ketorolac Tromethamine] Anxiety    Metabolic Disorder Labs: Lab Results  Component Value Date   HGBA1C 5.7 (H) 01/15/2015   Lab Results  Component Value Date   PROLACTIN 80.0 (H) 05/09/2015   Lab Results  Component Value Date   CHOL 95 03/10/2012   TRIG 50 03/10/2012   HDL 38 (L) 03/10/2012   CHOLHDL 2.5 03/10/2012   VLDL 10 03/10/2012   LDLCALC 47 03/10/2012   Lab Results  Component Value Date   TSH 1.350 01/01/2021   TSH 2.616 08/25/2019    Therapeutic Level Labs: No results found for: LITHIUM Lab Results  Component Value Date   VALPROATE 66 06/23/2020   No components found for:  CBMZ  Current Medications: Current Outpatient Medications  Medication Sig Dispense Refill   amoxicillin-clavulanate (AUGMENTIN) 875-125 MG tablet Take 1 tablet by mouth 2 (two) times daily. 14 tablet 0   amphetamine-dextroamphetamine (ADDERALL XR) 30 MG 24 hr capsule Take 1 capsule (30 mg total) by mouth every morning. 30 capsule 0   benzonatate (TESSALON) 100 MG capsule Take 1-2 capsules (100-200 mg total) by mouth 3 (three) times daily as needed for cough. 60 capsule 0   Calcium Carb-Cholecalciferol (CALCIUM-VITAMIN D3) 600-500 MG-UNIT CAPS Take 1 tablet by mouth daily.     Carboxymethylcellul-Glycerin (LUBRICATING EYE DROPS OP) Place 1 drop  into both eyes daily as needed (dry eyes).     cetirizine (ZYRTEC ALLERGY) 10 MG tablet Take 1 tablet (10 mg total) by mouth daily. 30 tablet 0   clidinium-chlordiazePOXIDE (LIBRAX) 5-2.5 MG capsule Take 1 capsule by mouth 3 (three) times daily before meals. 90 capsule 5   clonazePAM (KLONOPIN) 0.5 MG tablet Take 1 tablet (0.5 mg total) by mouth 2 (two) times daily as needed. 60 tablet 2   DULoxetine (CYMBALTA) 60 MG capsule Take 1 capsule (60 mg total) by mouth 2 (two) times daily. 180 capsule 2   Erenumab-aooe (AIMOVIG) 140 MG/ML  SOAJ Inject 140 mg into the skin every 28 (twenty-eight) days. 1 mL 11   fluconazole (DIFLUCAN) 150 MG tablet Take 1 tablet (150 mg total) by mouth once a week. 2 tablet 0   fluticasone (FLOVENT HFA) 220 MCG/ACT inhaler Inhale 2 puffs into the lungs 2 (two) times daily. Do not inhale, swallow powder and drink water , do not eat food for 20 minutes after puffs 4 each 2   HYDROmorphone (DILAUDID) 4 MG tablet Take 4 mg by mouth every 4 (four) hours as needed for severe pain.     lansoprazole (PREVACID) 30 MG capsule Take 1 capsule (30 mg total) by mouth daily. 180 capsule 1   levalbuterol (XOPENEX HFA) 45 MCG/ACT inhaler INHALE (2) PUFFS INTO THE LUNGS EVERY SIX HOURS AS NEEDED FOR WHEEZING. (Patient taking differently: Inhale 2 puffs into the lungs every 6 (six) hours as needed for wheezing or shortness of breath. INHALE (2) PUFFS INTO THE LUNGS EVERY SIX HOURS AS NEEDED FOR WHEEZING.) 15 g 3   levalbuterol (XOPENEX) 1.25 MG/3ML nebulizer solution Take 1.25 mg by nebulization every 4 (four) hours as needed for wheezing. 50 mL 3   linaclotide (LINZESS) 290 MCG CAPS capsule Take 1 capsule (290 mcg total) by mouth daily before breakfast. 90 capsule 3   meclizine (ANTIVERT) 25 MG tablet Take 1 tablet (25 mg total) by mouth 3 (three) times daily as needed for dizziness. 30 tablet 0   metroNIDAZOLE (METROGEL VAGINAL) 0.75 % vaginal gel Place 1 Applicatorful vaginally at bedtime. 70 g 0   ondansetron (ZOFRAN) 8 MG tablet TAKE 1 TABLET EVERY 8 HOURS AS NEEDED FOR NAUSEA 12 tablet 4   pantoprazole (PROTONIX) 40 MG tablet Take 1 tablet (40 mg total) by mouth 2 (two) times daily before a meal. 60 tablet 5   Pediatric Multivit-Minerals-C (FLINTSTONES GUMMIES) chewable tablet Chew 3 tablets by mouth daily.     promethazine (PHENERGAN) 12.5 MG tablet TAKE 1 TABLET BY MOUTH EVERY 8 HOURS AS NEEDED FOR NAUSEA OR VOMITING 30 tablet 0   promethazine (PHENERGAN) 25 MG tablet TAKE (1) TABLET BY MOUTH TWICE A DAY AS NEEDED.  30 tablet 2   promethazine-dextromethorphan (PROMETHAZINE-DM) 6.25-15 MG/5ML syrup Take 5 mLs by mouth at bedtime as needed for cough. 100 mL 0   pseudoephedrine (SUDAFED) 60 MG tablet Take 1 tablet (60 mg total) by mouth every 8 (eight) hours as needed for congestion. 30 tablet 0   Rimegepant Sulfate (NURTEC) 75 MG TBDP Take 1 tablet as needed for severe migraine.  Limit to twice per week. 10 tablet 3   Suvorexant (BELSOMRA) 20 MG TABS Take 20 mg by mouth at bedtime. 30 tablet 2   tiZANidine (ZANAFLEX) 4 MG tablet Take 4 mg by mouth 3 (three) times daily as needed.     traZODone (DESYREL) 100 MG tablet Take 2 tablets (200 mg total) by mouth at bedtime.  60 tablet 2   Vitamin D, Ergocalciferol, (DRISDOL) 1.25 MG (50000 UNIT) CAPS capsule Take 50,000 Units by mouth every Wednesday.     vortioxetine HBr (TRINTELLIX) 10 MG TABS tablet Take 1 tablet (10 mg total) by mouth daily. 30 tablet 2   No current facility-administered medications for this visit.     Musculoskeletal: Strength & Muscle Tone: na Gait & Station: na Patient leans: N/A  Psychiatric Specialty Exam: Review of Systems  Musculoskeletal:  Positive for back pain.  Neurological:  Positive for headaches.  Psychiatric/Behavioral:  Positive for sleep disturbance. The patient is nervous/anxious.   All other systems reviewed and are negative.  There were no vitals taken for this visit.There is no height or weight on file to calculate BMI.  General Appearance: NA  Eye Contact:  NA  Speech:  Clear and Coherent  Volume:  Normal  Mood:  Anxious  Affect:  NA  Thought Process:  Goal Directed  Orientation:  Full (Time, Place, and Person)  Thought Content: Rumination   Suicidal Thoughts:  No  Homicidal Thoughts:  No  Memory:  Immediate;   Good Recent;   Good Remote;   Fair  Judgement:  Fair  Insight:  Fair  Psychomotor Activity:  Decreased  Concentration:  Concentration: Good and Attention Span: Good  Recall:  Good  Fund of  Knowledge: Good  Language: Good  Akathisia:  No  Handed:  Right  AIMS (if indicated): not done  Assets:  Communication Skills Desire for Improvement Resilience Social Support Talents/Skills  ADL's:  Intact  Cognition: WNL  Sleep:  Poor   Screenings: AIMS    Flowsheet Row Admission (Discharged) from 06/19/2020 in [facility]  AIMS Total Score 0      AUDIT    Flowsheet Row Patient Outreach Telephone from 07/14/2020 in [facility] Admission (Discharged) from 06/19/2020 in [facility] ED to Hosp-Admission (Discharged) from 05/09/2015 in [facility]  Alcohol Use Disorder Identification Test Final Score (AUDIT) 1 1 0      GAD-7    Flowsheet Row Office Visit from 08/25/2021 in [name] from 12/11/2018 in [facility] from 02/07/2017 in [facility]  Total GAD-7 Score _0 PHQ2-9    Flowsheet Row Video Visit from 12/09/2021 in [facility] from 11/12/2021 in [facility] Video Visit from 10/22/2021 in [facility] Video Visit from 09/24/2021 in [facility] Video Visit from 09/07/2021 in [facility] [facility]  PHQ-2 Total Score 0 _1 PHQ-9 Total Score -- _2 Flowsheet Row Video Visit from 12/09/2021 in [facility] from 11/12/2021 in [facility] Video Visit from 10/22/2021 in [facility] No Risk No Risk No Risk        Assessment and Plan: This patient is a 36 year old female with  a history of anxiety depression ADHD pseudoseizures borderline personality disorder and insomnia.  Since she did not tolerate Belsomra we will go back to trazodone 200 mg at bedtime for sleep.  She will continue Cymbalta 120 mg daily for depression.  Since Trintellix may be contributing to headache she will cut it back to 5  mg.  She will continue Adderall XR 30 mg every morning for focus and clonazepam 0.5 mg twice daily as needed for anxiety.  She has started intensive outpatient program but did not complete it and she states she is going to call back to complete it.  She will return to see me in 4 weeks   Penny Spiller, MD 12/09/2021, 11:50 AM

## 2021-12-14 ENCOUNTER — Ambulatory Visit (INDEPENDENT_AMBULATORY_CARE_PROVIDER_SITE_OTHER): Payer: Medicaid Other | Admitting: Licensed Clinical Social Worker

## 2021-12-14 ENCOUNTER — Other Ambulatory Visit (HOSPITAL_COMMUNITY): Payer: Self-pay | Admitting: Psychiatry

## 2021-12-14 DIAGNOSIS — F332 Major depressive disorder, recurrent severe without psychotic features: Secondary | ICD-10-CM | POA: Diagnosis not present

## 2021-12-14 DIAGNOSIS — F411 Generalized anxiety disorder: Secondary | ICD-10-CM

## 2021-12-14 MED ORDER — TRAZODONE HCL 100 MG PO TABS: 200.0000 mg | ORAL_TABLET | Freq: Every day | ORAL | 2 refills | Status: DC

## 2021-12-15 ENCOUNTER — Telehealth (HOSPITAL_COMMUNITY): Payer: Self-pay | Admitting: Professional

## 2021-12-15 ENCOUNTER — Ambulatory Visit (HOSPITAL_COMMUNITY): Payer: Medicaid Other

## 2021-12-16 ENCOUNTER — Ambulatory Visit (INDEPENDENT_AMBULATORY_CARE_PROVIDER_SITE_OTHER): Payer: Medicaid Other | Admitting: Licensed Clinical Social Worker

## 2021-12-16 DIAGNOSIS — F332 Major depressive disorder, recurrent severe without psychotic features: Secondary | ICD-10-CM | POA: Diagnosis not present

## 2021-12-16 DIAGNOSIS — F411 Generalized anxiety disorder: Secondary | ICD-10-CM

## 2021-12-16 NOTE — Progress Notes (Signed)
Virtual Visit via Video Note  I connected with [name] on 12/16/21 at  9:00 AM EST by a video enabled telemedicine application and verified that I am speaking with the correct person using two identifiers.  Location: Patient: Home Provider: Office   I discussed the limitations of evaluation and management by telemedicine and the availability of in person appointments. The patient expressed understanding and agreed to proceed.   I discussed the assessment and treatment plan with the patient. The patient was provided an opportunity to ask questions and all were answered. The patient agreed with the plan and demonstrated an understanding of the instructions.   The patient was advised to call back or seek an in-person evaluation if the symptoms worsen or if the condition fails to improve as anticipated.  I provided 15 minutes of non-face-to-face time during this encounter.   Derrill Center, NP    Psychiatric Initial Adult Assessment   Patient Identification: Penny [name] MRN:  [id number] Date of Evaluation:  12/16/2021 Referral Source: MD Harrington Challenger Chief Complaint:  Worsening Depression  Visit Diagnosis:    ICD-10-CM   1. Severe episode of recurrent major depressive disorder, without psychotic features ([place])  F33.2       History of Present Illness:   Penny [name] is a 36 year old Caucasian female that presents due to worsening depression symptoms.  States she felt her mood has declined since she is unable to follow-up with her therapist weekly.  Currently she is denying suicidal or homicidal ideations.  Denies auditory visual hallucinations.  Reports previous inpatient admissions 2 years prior. Stated that she is currently disable and hasn't been able to work for the past 3 years.   [name] reports poor concentration, depressed mood and symptoms of worry.  Reports she was recently started on Trintellix but she has been having intense migraine.  Discussed discontinuing medications due to  tolerability.   [name] reports a history of chronic pain after an incident during work.  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  States since this event her health has been declining.  Reports she has good support system.  Reports she currently resides with her mother and has 2 children.  Glayds reports a history of major depressive disorder, generalized anxiety disorder and posttraumatic stress disorder.  States she is currently followed by psychiatrist Harrington Challenger and therapist [place].  Reports she is currently prescribed Trintellix, Cymbalta and Klonopin.  Patient to start partial intensive outpatient programming on 12/16/2021  Associated Signs/Symptoms: Depression Symptoms:  depressed mood, feelings of worthlessness/guilt, difficulty concentrating, anxiety, panic attacks, (Hypo) Manic Symptoms:  Distractibility, Impulsivity, Irritable Mood, Labiality of Mood, Anxiety Symptoms:  Excessive Worry, Psychotic Symptoms:  Hallucinations: None PTSD Symptoms: NA  Past Psychiatric History:   Previous Psychotropic Medications: Yes   Substance Abuse History in the last 12 months:  No.  Consequences of Substance Abuse: NA  Past Medical History:  Past Medical History:  Diagnosis Date   ADD (attention deficit disorder)    Anemia 2011   2o to GASTRIC BYPASS   Anginal pain ([place])    Anxiety    Asthma    Blood transfusion without reported diagnosis    Chronic daily headache    Depression    Depression    Dysmenorrhea 09/18/2013   Dysrhythmia    Elevated liver enzymes JUL 2011ALK PHOS 111-127 AST  143-267 ALT  213-321T BILI 0.6  ALB  3.7-4.06 Jun 2011 ALK PHOS 118 AST 24 ALT  70 T BILI 0.4 ALB 3.9   Encounter for drug rehabilitation    at behavioral health for opioid addiction about 4 years ago   Family history of adverse reaction to anesthesia    'dad had to be kept on pump for breathing for morphine'   Fatty liver    Fibroid 01/18/2017   Fibromyalgia     Gastric bypass status for obesity    Gastritis JULY 2011   Heart murmur    Hereditary and idiopathic peripheral neuropathy 01/15/2015   History of Holter monitoring    Hx of opioid abuse ([place])    for about 4 years, about 4 years ago   Interstitial cystitis    Iron deficiency anemia 07/23/2010   Irritable bowel syndrome 2012 DIARRHEA   JUN 2012 TTG IgA 14.9   IUD FEB 2010   Lupus ([place])    Menorrhagia 07/18/2013   Migraines    Obesity (BMI 30-39.9) 2011 228 LBS BMI 36.8   Osteoporosis    Ovarian cyst    Patient desires pregnancy 09/18/2013   Polyneuropathy    PONV (postoperative nausea and vomiting) seizure post-operatively   seizure following ankle surgery   Potassium (K) deficiency    Pregnant 12/25/2013   Psychiatric pseudoseizure    RLQ abdominal pain 07/18/2013   Sciatica of left side 11/14/2014   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   Stress 09/18/2013    Past Surgical History:  Procedure Laterality Date   BIOPSY  04/10/2015   Procedure: BIOPSY;  Surgeon: Daneil Dolin, MD;  Location: [facility];  Service: Endoscopy;;   BIOPSY  04/15/2021   Procedure: BIOPSY;  Surgeon: Montez Morita, Quillian Quince, MD;  [facility];  Service: Gastroenterology;;   cath self every nite     for sodium bicarb injection (discontinued 2013)   CHOLECYSTECTOMY  2005   biliary dyskinesia   COLONOSCOPY  JUN 2012 ABD PN/DIARRHEA WITH PROPOFOL   NL COLON   COLONOSCOPY WITH PROPOFOL N/A 01/16/2021   Procedure: COLONOSCOPY WITH PROPOFOL;  Surgeon: Harvel Quale, MD;  [facility];  Service: Gastroenterology;  Laterality: N/A;  1:45   DILATION AND CURETTAGE OF UTERUS     DILITATION & CURRETTAGE/HYSTROSCOPY WITH NOVASURE ABLATION N/A 03/24/2017   Procedure: DILATATION & CURETTAGE/HYSTEROSCOPY WITH NOVASURE ENDOMETRIAL ABLATION;  Surgeon: Jonnie Kind, MD;  Location: [facility];  Service: Gynecology;  Laterality: N/A;   ESOPHAGEAL DILATION N/A 04/10/2015   Procedure: ESOPHAGEAL  DILATION WITH 54FR MALONEY DILATOR;  Surgeon: Daneil Dolin, MD;  Location: [facility];  Service: Endoscopy;  Laterality: N/A;   ESOPHAGOGASTRODUODENOSCOPY     ESOPHAGOGASTRODUODENOSCOPY (EGD) WITH PROPOFOL N/A 04/10/2015   Procedure: ESOPHAGOGASTRODUODENOSCOPY (EGD) WITH PROPOFOL;  Surgeon: Daneil Dolin, MD;  [facility];  Service: Endoscopy;  Laterality: N/A;   ESOPHAGOGASTRODUODENOSCOPY (EGD) WITH PROPOFOL N/A 12/06/2016   Procedure: ESOPHAGOGASTRODUODENOSCOPY (EGD) WITH PROPOFOL;  Surgeon: Danie Binder, MD;  [facility];  Service: Endoscopy;  Laterality: N/A;   ESOPHAGOGASTRODUODENOSCOPY (EGD) WITH PROPOFOL N/A 04/27/2019   Procedure: ESOPHAGOGASTRODUODENOSCOPY (EGD) WITH PROPOFOL;  Surgeon: Rogene Houston, MD;  [facility];  Service: Endoscopy;  Laterality: N/A;  730   ESOPHAGOGASTRODUODENOSCOPY (EGD) WITH PROPOFOL N/A 08/31/2019   Procedure: ESOPHAGOGASTRODUODENOSCOPY (EGD) WITH PROPOFOL;  Surgeon: Rogene Houston, MD;  [facility];  Service: Endoscopy;  Laterality: N/A;   ESOPHAGOGASTRODUODENOSCOPY (EGD) WITH PROPOFOL N/A 01/16/2021   Procedure: ESOPHAGOGASTRODUODENOSCOPY (EGD) WITH PROPOFOL;  Surgeon: Harvel Quale, MD;  [facility];  Service: Gastroenterology;  Laterality: N/A;   ESOPHAGOGASTRODUODENOSCOPY (EGD) WITH PROPOFOL N/A 04/15/2021   Procedure: ESOPHAGOGASTRODUODENOSCOPY (EGD) WITH PROPOFOL;  Surgeon: Harvel Quale, MD;  [facility];  Service: Gastroenterology;  Laterality: N/A;  9:00 AM   GAB  2007   in [place]-POUCH 5 CM   GASTRIC BYPASS  06/2006   HYSTEROSCOPY WITH D & C N/A 09/12/2014   Procedure: DILATATION AND CURETTAGE /HYSTEROSCOPY;  Surgeon: Jonnie Kind, MD;  Location: [facility];  Service: Gynecology;  Laterality: N/A;   KYPHOPLASTY N/A 08/02/2018   Procedure: KYPHOPLASTY T12;  Surgeon: Melina Schools, MD;  Location: [facility];  Service: Orthopedics;  Laterality: N/A;  60 mins    LAPAROSCOPIC TUBAL LIGATION Bilateral 03/24/2017   Procedure: LAPAROSCOPIC TUBAL LIGATION ([place] Rings);  Surgeon: Jonnie Kind, MD;  Location: [facility];  Service: Gynecology;  Laterality: Bilateral;   REPAIR VAGINAL CUFF N/A 07/30/2014   Procedure: REPAIR VAGINAL CUFF;  Surgeon: Mora Bellman, MD;  Location: [facility];  Service: Gynecology;  Laterality: N/A;   SAVORY DILATION  06/20/2012   Dr. Barnie Alderman gastritis/Ulcer in the mid jejunum. Empiric dilation.    SURAL NERVE BX Left 02/25/2016   Procedure: LEFT SURAL NERVE BIOPSY;  Surgeon: Jovita Gamma, MD;  Location: [facility];  Service: Neurosurgery;  Laterality: Left;  Left sural nerve biopsy   TONSILLECTOMY     TONSILLECTOMY AND ADENOIDECTOMY     UPPER GASTROINTESTINAL ENDOSCOPY  JULY 2011 NAUSEA-D125,V6, PH 25   Bx; GASTRITIS, POUCH-5 CM LONG   WISDOM TOOTH EXTRACTION      Family Psychiatric History:   Family History:  Family History  Problem Relation Age of Onset   Coronary artery disease Paternal Grandfather    Migraines Paternal Grandmother    Breast cancer Paternal Grandmother    Hemochromatosis Maternal Grandmother    Migraines Maternal Grandmother    Cancer Maternal Grandmother    Breast cancer Maternal Grandmother    Hypertension Father    Diabetes Father    Coronary artery disease Father    Cancer Mother        breast   Hemochromatosis Mother    Breast cancer Mother    Depression Mother    Anxiety disorder Mother    Anxiety disorder Brother    Bipolar disorder Brother    Healthy Daughter    Healthy Son     Social History:   Social History   Socioeconomic History   Marital status: Divorced    Spouse name: Not on file   Number of children: 2   Years of education: some college   Highest education level: Some college, no degree  Occupational History   Occupation: Applying for Social Security Disability    Employer: NOT EMPLOYED  Tobacco Use   Smoking status: Former    Packs/day: 0.25    Years: 6.00     Pack years: 1.50    Types: Cigarettes    Start date: 05/2018   Smokeless tobacco: Never  Vaping Use   Vaping Use: Some days  Substance and Sexual Activity   Alcohol use: Not Currently    Alcohol/week: 0.0 standard drinks   Drug use: Not Currently   Sexual activity: Yes    Partners: Male    Birth control/protection: Surgical, Condom    Comment: tubal and ablation  Other Topics Concern   Not on file  Social History Narrative   Patient is right handed.   Patient drinks one cup of coffee  daily.   She lives in a one-story home with her two children (56 year old son, 51 month old daughter).   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 [employer] Strain: [employer] Risk   Difficulty of Paying Living Expenses: Somewhat hard  Food Insecurity: Geophysicist/field seismologist Present   Worried About Programme researcher, broadcasting/film/video in the Last Year: Sometimes true   Barista in the Last Year: Sometimes true  Transportation Needs: No Transportation Needs   Lack of Transportation (Medical): No   Lack of Transportation (Non-Medical): No  Physical Activity: Inactive   Days of Exercise per Week: 0 days   Minutes of Exercise per Session: 10 min  Stress: Stress Concern Present   Feeling of Stress : Very much  Social Connections: Moderately Isolated   Frequency of Communication with Friends and Family: More than three times a week   Frequency of Social Gatherings with Friends and Family: Once a week   Attends Religious Services: 1 to 4 times per year   Active Member of [employer] or Organizations: No   Attends Banker Meetings: Never   Marital Status: Divorced    Additional Social History:   Allergies:   Allergies  Allergen Reactions   Gabapentin Other (See Comments)    Pt states that she was unresponsive after taking this medication, but her vitals remained stable.     Metoclopramide Hcl Anxiety and Other (See  Comments)    Pt states that she felt like she was trapped in a box, and could not get out.  Pt also states that she had temporary loss of movement, weakness, and tingling.     Tramadol Other (See Comments)    Seizures   Ativan [Lorazepam] Other (See Comments)    combative   Latex Itching, Rash and Other (See Comments)    Burns   Lyrica [Pregabalin] Other (See Comments)    suicidal   Iron Other (See Comments)    PATIENT IS INTOLERANT TO ORAL IRON , SHE DOES NOT ABSORB IT.     Sulfa Antibiotics Hives   Butrans [Buprenorphine] Rash    Patch caused rash, didn't help with pain   Nucynta [Tapentadol] Nausea And Vomiting   Propofol Nausea And Vomiting   Tape Hives, Itching and Rash    Please use paper tape   Toradol [Ketorolac Tromethamine] Anxiety    Metabolic Disorder Labs: Lab Results  Component Value Date   HGBA1C 5.7 (H) 01/15/2015   Lab Results  Component Value Date   PROLACTIN 80.0 (H) 05/09/2015   Lab Results  Component Value Date   CHOL 95 03/10/2012   TRIG 50 03/10/2012   HDL 38 (L) 03/10/2012   CHOLHDL 2.5 03/10/2012   VLDL 10 03/10/2012   LDLCALC 47 03/10/2012   Lab Results  Component Value Date   TSH 1.350 01/01/2021    Therapeutic Level Labs: No results found for: LITHIUM Lab Results  Component Value Date   CBMZ 8.0 06/14/2017   Lab Results  Component Value Date   VALPROATE 66 06/23/2020    Current Medications: Current Outpatient Medications  Medication Sig Dispense Refill   amoxicillin-clavulanate (AUGMENTIN) 875-125 MG tablet Take 1 tablet by mouth 2 (two) times daily. 14 tablet 0   amphetamine-dextroamphetamine (ADDERALL XR) 30 MG 24 hr capsule Take 1 capsule (30 mg total) by mouth every morning. 30 capsule 0  benzonatate (TESSALON) 100 MG capsule Take 1-2 capsules (100-200 mg total) by mouth 3 (three) times daily as needed for cough. 60 capsule 0   Calcium Carb-Cholecalciferol (CALCIUM-VITAMIN D3) 600-500 MG-UNIT CAPS Take 1 tablet by mouth  daily.     Carboxymethylcellul-Glycerin (LUBRICATING EYE DROPS OP) Place 1 drop into both eyes daily as needed (dry eyes).     cetirizine (ZYRTEC ALLERGY) 10 MG tablet Take 1 tablet (10 mg total) by mouth daily. 30 tablet 0   clidinium-chlordiazePOXIDE (LIBRAX) 5-2.5 MG capsule Take 1 capsule by mouth 3 (three) times daily before meals. 90 capsule 5   clonazePAM (KLONOPIN) 0.5 MG tablet Take 1 tablet (0.5 mg total) by mouth 2 (two) times daily as needed. 60 tablet 2   DULoxetine (CYMBALTA) 60 MG capsule Take 1 capsule (60 mg total) by mouth 2 (two) times daily. 180 capsule 2   Erenumab-aooe (AIMOVIG) 140 MG/ML SOAJ Inject 140 mg into the skin every 28 (twenty-eight) days. 1 mL 11   fluconazole (DIFLUCAN) 150 MG tablet Take 1 tablet (150 mg total) by mouth once a week. 2 tablet 0   fluticasone (FLOVENT HFA) 220 MCG/ACT inhaler Inhale 2 puffs into the lungs 2 (two) times daily. Do not inhale, swallow powder and drink water , do not eat food for 20 minutes after puffs 4 each 2   HYDROmorphone (DILAUDID) 4 MG tablet Take 4 mg by mouth every 4 (four) hours as needed for severe pain.     lansoprazole (PREVACID) 30 MG capsule Take 1 capsule (30 mg total) by mouth daily. 180 capsule 1   levalbuterol (XOPENEX HFA) 45 MCG/ACT inhaler INHALE (2) PUFFS INTO THE LUNGS EVERY SIX HOURS AS NEEDED FOR WHEEZING. (Patient taking differently: Inhale 2 puffs into the lungs every 6 (six) hours as needed for wheezing or shortness of breath. INHALE (2) PUFFS INTO THE LUNGS EVERY SIX HOURS AS NEEDED FOR WHEEZING.) 15 g 3   levalbuterol (XOPENEX) 1.25 MG/3ML nebulizer solution Take 1.25 mg by nebulization every 4 (four) hours as needed for wheezing. 50 mL 3   linaclotide (LINZESS) 290 MCG CAPS capsule Take 1 capsule (290 mcg total) by mouth daily before breakfast. 90 capsule 3   meclizine (ANTIVERT) 25 MG tablet Take 1 tablet (25 mg total) by mouth 3 (three) times daily as needed for dizziness. 30 tablet 0   metroNIDAZOLE  (METROGEL VAGINAL) 0.75 % vaginal gel Place 1 Applicatorful vaginally at bedtime. 70 g 0   ondansetron (ZOFRAN) 8 MG tablet TAKE 1 TABLET EVERY 8 HOURS AS NEEDED FOR NAUSEA 12 tablet 4   pantoprazole (PROTONIX) 40 MG tablet Take 1 tablet (40 mg total) by mouth 2 (two) times daily before a meal. 60 tablet 5   Pediatric Multivit-Minerals-C (FLINTSTONES GUMMIES) chewable tablet Chew 3 tablets by mouth daily.     promethazine (PHENERGAN) 12.5 MG tablet TAKE 1 TABLET BY MOUTH EVERY 8 HOURS AS NEEDED FOR NAUSEA OR VOMITING 30 tablet 0   promethazine (PHENERGAN) 25 MG tablet TAKE (1) TABLET BY MOUTH TWICE A DAY AS NEEDED. 30 tablet 2   promethazine-dextromethorphan (PROMETHAZINE-DM) 6.25-15 MG/5ML syrup Take 5 mLs by mouth at bedtime as needed for cough. 100 mL 0   pseudoephedrine (SUDAFED) 60 MG tablet Take 1 tablet (60 mg total) by mouth every 8 (eight) hours as needed for congestion. 30 tablet 0   Rimegepant Sulfate (NURTEC) 75 MG TBDP Take 1 tablet as needed for severe migraine.  Limit to twice per week. 10 tablet 3   Suvorexant (  BELSOMRA) 20 MG TABS Take 20 mg by mouth at bedtime. 30 tablet 2   tiZANidine (ZANAFLEX) 4 MG tablet Take 4 mg by mouth 3 (three) times daily as needed.     traZODone (DESYREL) 100 MG tablet Take 2 tablets (200 mg total) by mouth at bedtime. 60 tablet 2   Vitamin D, Ergocalciferol, (DRISDOL) 1.25 MG (50000 UNIT) CAPS capsule Take 50,000 Units by mouth every Wednesday.     vortioxetine HBr (TRINTELLIX) 10 MG TABS tablet Take 1 tablet (10 mg total) by mouth daily. 30 tablet 2   No current facility-administered medications for this visit.    Musculoskeletal: Strength & Muscle Tone: within normal limits Gait & Station: normal Patient leans: N/A  Psychiatric Specialty Exam: Review of Systems  HENT: Negative.    Eyes: Negative.   Cardiovascular: Negative.   Gastrointestinal: Negative.   Neurological:  Positive for headaches.  Psychiatric/Behavioral:  Positive for sleep  disturbance. The patient is nervous/anxious.   All other systems reviewed and are negative.  There were no vitals taken for this visit.There is no height or weight on file to calculate BMI.  General Appearance: Casual  Eye Contact:  Good  Speech:  Clear and Coherent  Volume:  Normal  Mood:  Anxious and Depressed  Affect:  Congruent  Thought Process:  Coherent  Orientation:  Full (Time, Place, and Person)  Thought Content:  Logical  Suicidal Thoughts:  No  Homicidal Thoughts:  No  Memory:  Immediate;   Good Recent;   Good  Judgement:  Good  Insight:  Good  Psychomotor Activity:  Normal  Concentration:  Concentration: Good  Recall:  Good  Fund of Knowledge:Good  Language: Good  Akathisia:  No  Handed:  Right  AIMS (if indicated):  done  Assets:  Communication Skills Desire for Improvement Social Support  ADL's:  Intact  Cognition: WNL  Sleep:  Good   Screenings: AIMS    Flowsheet Row Admission (Discharged) from 06/19/2020 in [facility]  AIMS Total Score 0      AUDIT    Flowsheet Row Patient Outreach Telephone from 07/14/2020 in [facility] Admission (Discharged) from 06/19/2020 in [facility] ED to Hosp-Admission (Discharged) from 05/09/2015 in [facility]  Alcohol Use Disorder Identification Test Final Score (AUDIT) 1 1 0      GAD-7    Flowsheet Row Office Visit from 08/25/2021 in [facility] from 12/11/2018 in [facility] from 02/07/2017 in [facility]  Total GAD-7 Score $RemoveBef'13 19 17      'vifqPpwfDt$ PHQ2-9    Flowsheet Row Video Visit from 12/09/2021 in [facility] from 11/12/2021 in [facility] Video Visit from 10/22/2021 in [facility] Video Visit from 09/24/2021 in [facility] Video Visit from 09/07/2021 in [facility]  PHQ-2 Total Score 0 $Remov'4 2 2 4  'vuFLFy$ PHQ-9 Total Score -- $Remove'20 9 6 13      'UdoluPZ$ Flowsheet Row Video Visit from 12/09/2021 in [facility] from 11/12/2021 in [facility] Video Visit from 10/22/2021 in [facility] No Risk No Risk No Risk       Assessment and Plan:  Patient to start partial hospitalization programming PHP- [place] Trintellix  5 mg due to reported worsening migraine  Treatment plan was reviewed and agreed upon by NP T. Bobby Rumpf and patient Brendalyn Vallely need for group services.    Derrill Center, NP 1/11/202311:35 AM

## 2021-12-17 ENCOUNTER — Encounter (HOSPITAL_COMMUNITY): Payer: Self-pay | Admitting: Professional

## 2021-12-17 ENCOUNTER — Ambulatory Visit (INDEPENDENT_AMBULATORY_CARE_PROVIDER_SITE_OTHER): Payer: Medicaid Other | Admitting: Licensed Clinical Social Worker

## 2021-12-17 ENCOUNTER — Encounter (HOSPITAL_COMMUNITY): Payer: Self-pay

## 2021-12-17 DIAGNOSIS — F411 Generalized anxiety disorder: Secondary | ICD-10-CM

## 2021-12-17 DIAGNOSIS — F332 Major depressive disorder, recurrent severe without psychotic features: Secondary | ICD-10-CM | POA: Diagnosis not present

## 2021-12-17 NOTE — Progress Notes (Signed)
Spoke with patient via Webex video call, used 2 identifiers to correctly identify patient. States this is her 2nd time in PHP as recommended by Dr. Harrington Challenger her psychiatrist. She is having a lot of stress and depression related to an injury that happened several years ago. She was tripped at work as a joke and fell on her back on concrete fracturing it. She later fractured it again after passing out at home. 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 She hopes to get disability but has not been successful to date. She is trying to learn how to navigate life after the injury. Currently has a migraine that is on day 2. They were under control until about 2 months ago. On scale 1-10 as 10 being worst she rates depression at 5 and anxiety at 7. Denies SI/HI or AV hallucinations. PHQ9=20. No side effects from medication. No issues or complaints. Groups are going well so far.

## 2021-12-18 ENCOUNTER — Ambulatory Visit (HOSPITAL_COMMUNITY): Payer: Medicaid Other

## 2021-12-18 ENCOUNTER — Telehealth (HOSPITAL_COMMUNITY): Payer: Self-pay | Admitting: Professional

## 2021-12-18 NOTE — Telephone Encounter (Signed)
Cln calls pt per email sent to Lorin Glass stating pt has questions about continuing group. Cln spent 30 minutes discussing options: continuing group until pt leaves for [place] next week, returning to previous providers, or reaching out to [facility] for list of counselors that can see pt more often. Cln encourages pt to find a trauma informed counselor. Pt agrees. Pt denies current SI/HI/AVH. Pt decides to discharge from [facility] at this time and will contact this cln in the future if she has any needs. Cln agrees.

## 2021-12-19 NOTE — Progress Notes (Signed)
°  [facility] Partial  Outpatient Program Discharge Summary  Penny [name] [id number]  Admission date: 12/16/2021 Discharge date: 12/18/2021  Reason for admission: Depression and pain   Per admission assessment note: Penny [name] is a 36 year old Caucasian female that presents due to worsening depression symptoms.  States she felt her mood has declined since she is unable to follow-up with her therapist weekly.  Currently she is denying suicidal or homicidal ideations.  Denies auditory visual hallucinations.  Reports previous inpatient admissions 2 years prior. Stated that she is currently disable and hasn't been able to work for the past 3 years.     Progress in Program Toward Treatment Goals: Ongoing, it was reported patient has declined to continue in Partial Hospitalization Programming at this time.  Progress (rationale): - Patient to keep all outpatient follow-up appointments   Take all medications as prescribed. Keep all follow-up appointments as scheduled.  Do not consume alcohol or use illegal drugs while on prescription medications. Report any adverse effects from your medications to your primary care provider promptly.  In the event of recurrent symptoms or worsening symptoms, call 911, a crisis hotline, or go to the nearest emergency department for evaluation.     Ricky Ala NP  12/19/2021

## 2021-12-21 ENCOUNTER — Ambulatory Visit (HOSPITAL_COMMUNITY): Payer: Medicaid Other

## 2021-12-22 ENCOUNTER — Encounter: Payer: Self-pay | Admitting: *Deleted

## 2021-12-22 ENCOUNTER — Other Ambulatory Visit: Payer: Self-pay

## 2021-12-22 ENCOUNTER — Ambulatory Visit (HOSPITAL_COMMUNITY): Payer: Medicaid Other | Admitting: Clinical

## 2021-12-22 ENCOUNTER — Ambulatory Visit (INDEPENDENT_AMBULATORY_CARE_PROVIDER_SITE_OTHER): Payer: Medicaid Other | Admitting: Nurse Practitioner

## 2021-12-22 ENCOUNTER — Ambulatory Visit (HOSPITAL_COMMUNITY): Payer: Self-pay

## 2021-12-22 ENCOUNTER — Telehealth: Payer: Self-pay | Admitting: Family Medicine

## 2021-12-22 ENCOUNTER — Encounter: Payer: Self-pay | Admitting: Nurse Practitioner

## 2021-12-22 ENCOUNTER — Encounter: Payer: Self-pay | Admitting: Family Medicine

## 2021-12-22 DIAGNOSIS — U071 COVID-19: Secondary | ICD-10-CM

## 2021-12-22 DIAGNOSIS — J4520 Mild intermittent asthma, uncomplicated: Secondary | ICD-10-CM

## 2021-12-22 IMAGING — CT CT ABD-PELV W/O CM
2 of 4 series · 17 of 46 positions shown, 19 images · non-contrast
Comparison: October 20, 2019

CLINICAL DATA: Chronic abdominal pain.

EXAM:
CT ABDOMEN AND PELVIS WITHOUT CONTRAST
TECHNIQUE: Multidetector CT imaging of the abdomen and pelvis was performed
following the standard protocol without IV contrast.

[Series 2: axial st · axial · 0.80mm/px · z∈[+1058,+1478]mm · 14 of 96 slices shown, 16 images]
[im 6/96  soft-tissue]
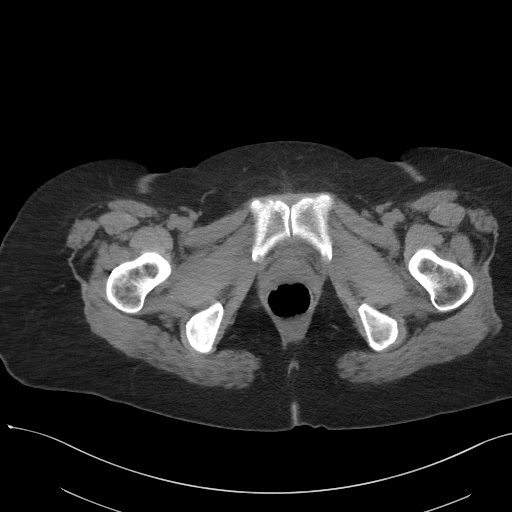
[im 6/96  bone]
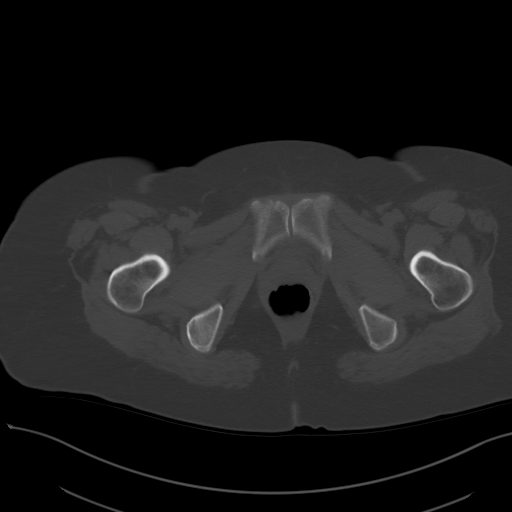
[im 12/96  soft-tissue]
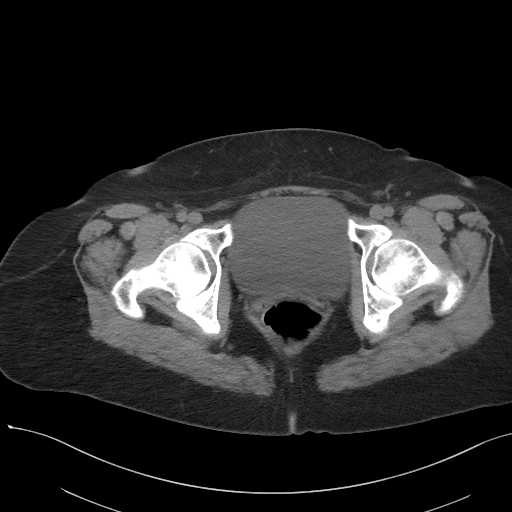
[im 18/96  soft-tissue]
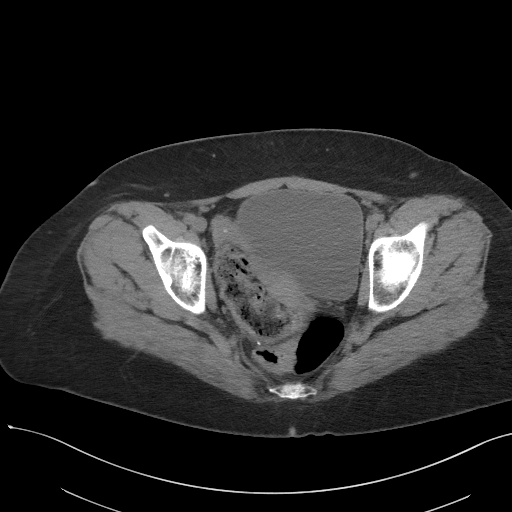
[im 24/96  soft-tissue]
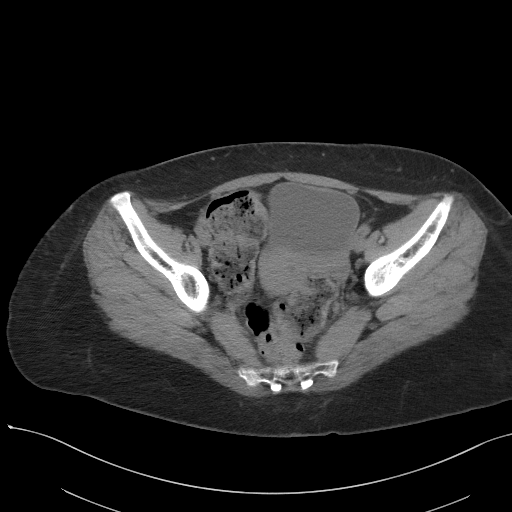
[im 30/96  soft-tissue]
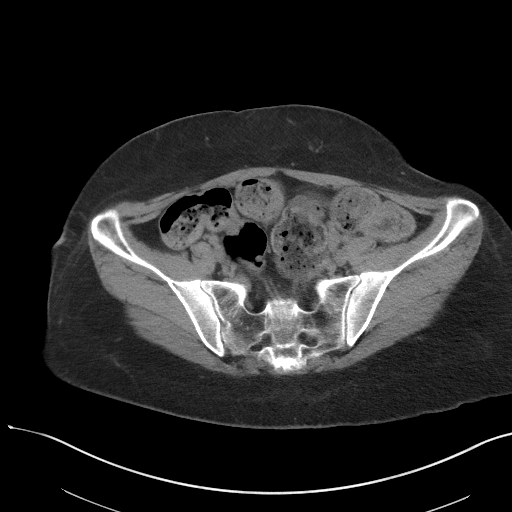
[im 36/96  soft-tissue]
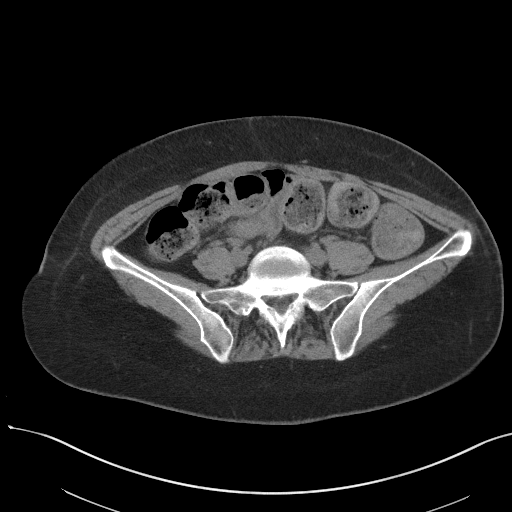
[im 42/96  soft-tissue]
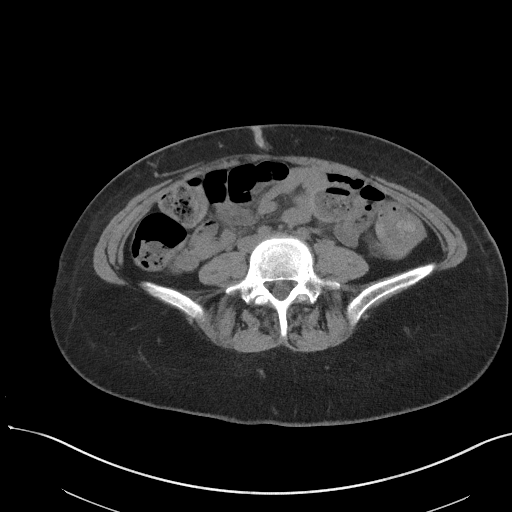
[im 54/96  soft-tissue]
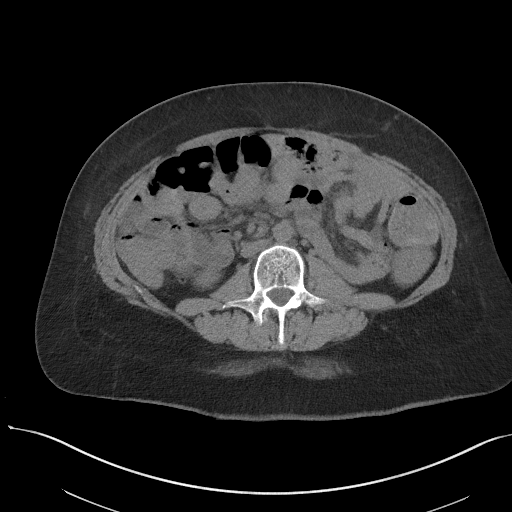
[im 60/96  soft-tissue]
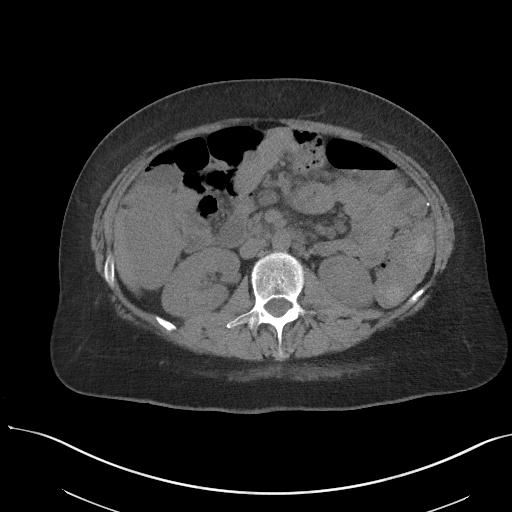
[im 60/96  bone]
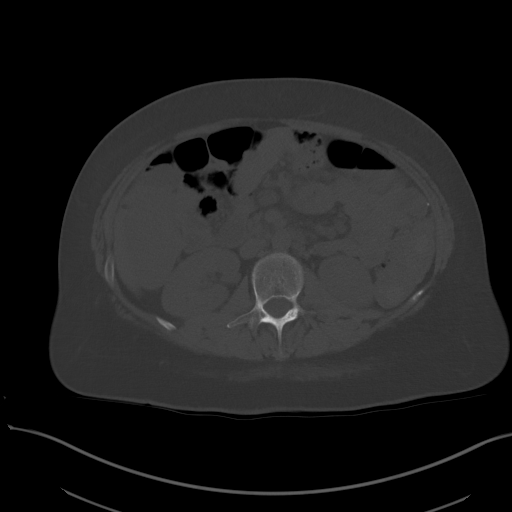
[im 66/96  soft-tissue]
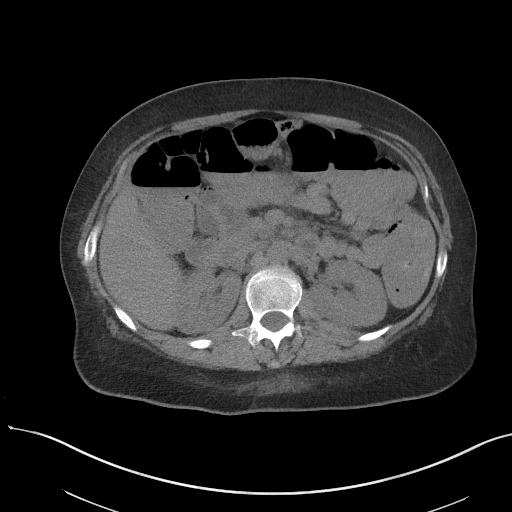
[im 72/96  soft-tissue]
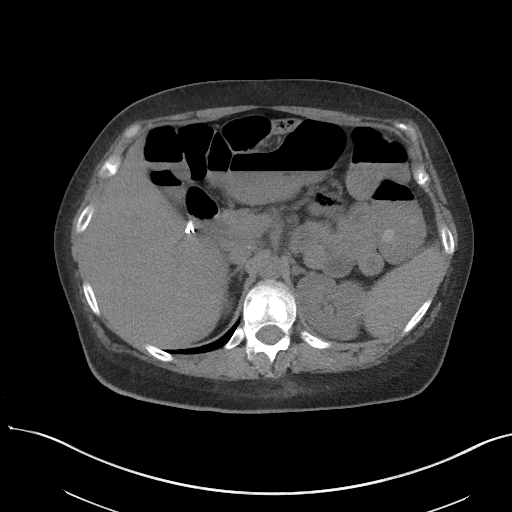
[im 78/96  soft-tissue]
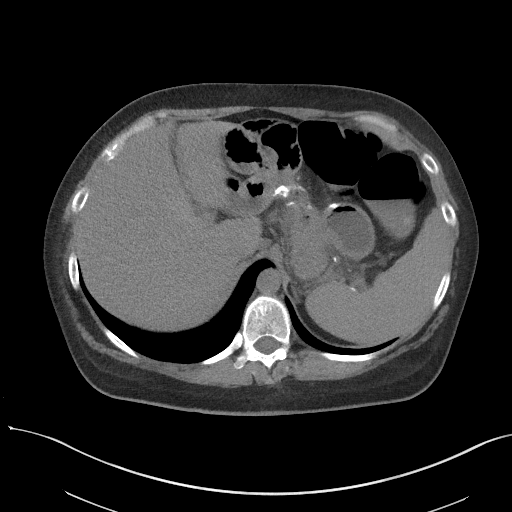
[im 84/96  soft-tissue]
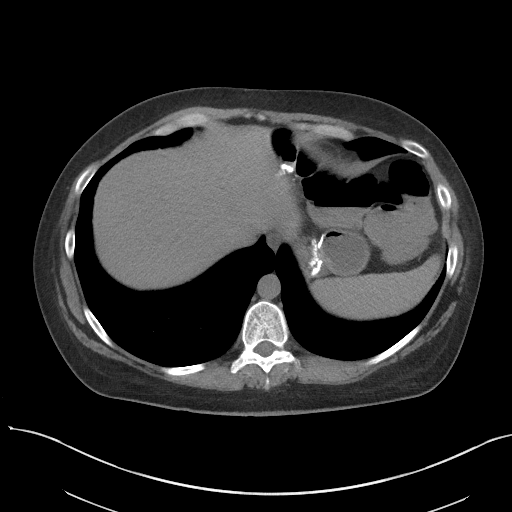
[im 90/96  soft-tissue]
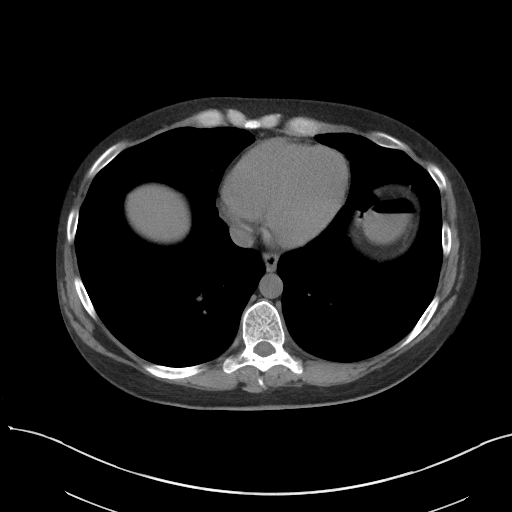

[Series 5: coronal st · coronal · 0.93mm/px · 3 of 91 slices shown]
[im 31/91  soft-tissue]
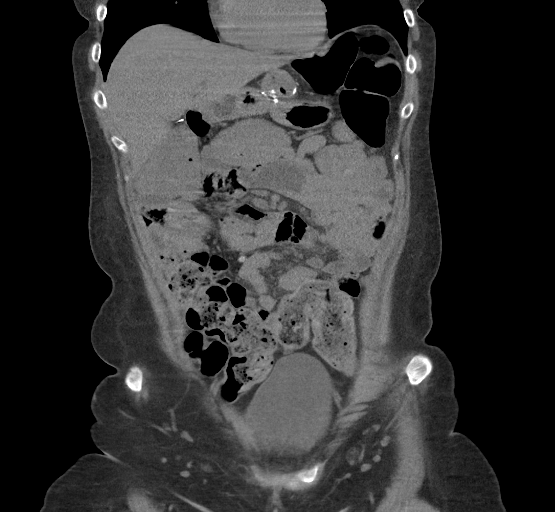
[im 41/91  soft-tissue]
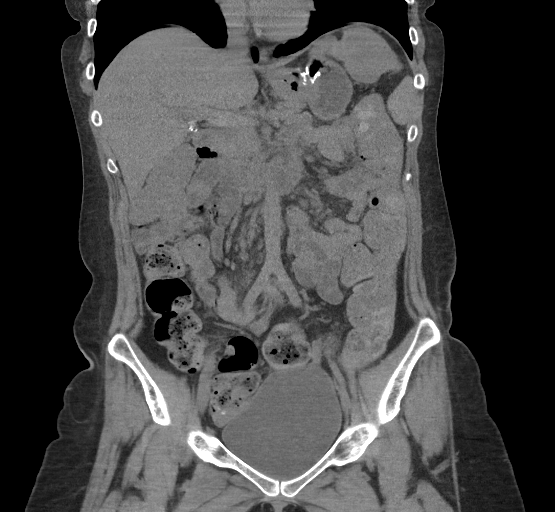
[im 51/91  soft-tissue]
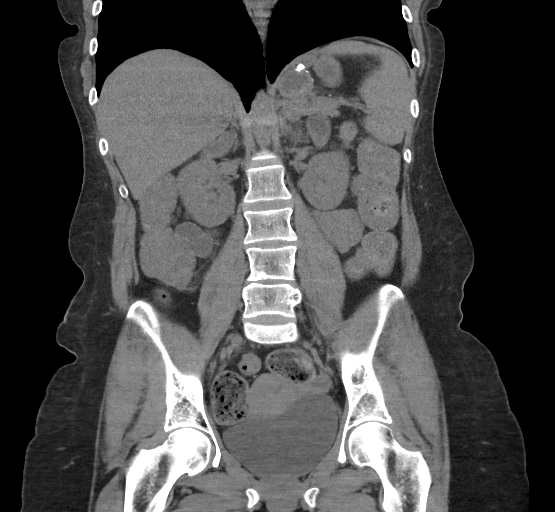

[17 of 46 positions shown; findings below may reference images not displayed]

FINDINGS: Lower chest: No acute abnormality.

Hepatobiliary: No focal liver abnormality is seen. Status post
cholecystectomy. No biliary dilatation.

Pancreas: Unremarkable. No pancreatic ductal dilatation or
surrounding inflammatory changes.

Spleen: Normal in size without focal abnormality.

Adrenals/Urinary Tract: Adrenal glands are unremarkable. Kidneys are
normal, without renal calculi, focal lesion, or hydronephrosis.
Bladder is unremarkable.

Stomach/Bowel: Multiple surgical sutures are seen within the gastric
region. The appendix is not identified. No evidence of bowel wall
thickening, distention, or inflammatory changes. A large amount of
stool is seen throughout the colon.

Vascular/Lymphatic: No significant vascular findings are present. No
enlarged abdominal or pelvic lymph nodes.

Reproductive: Uterus and bilateral adnexa are unremarkable.

Other: No abdominal wall hernia or abnormality. No abdominopelvic
ascites.

Musculoskeletal: Degenerative changes seen throughout the lumbar
spine with evidence of prior vertebroplasty noted at the level of
T12.
IMPRESSION: 1. Findings consistent with history of prior gastric bypass surgery.
2. Evidence of prior cholecystectomy.

## 2021-12-22 MED ORDER — CETIRIZINE HCL 10 MG PO TABS: 10.0000 mg | ORAL_TABLET | Freq: Every day | ORAL | 0 refills | Status: DC

## 2021-12-22 MED ORDER — PREDNISONE 20 MG PO TABS: ORAL_TABLET | ORAL | 0 refills | Status: DC

## 2021-12-22 MED ORDER — NIRMATRELVIR/RITONAVIR (PAXLOVID) TABLET (RENAL DOSING): 2.0000 | ORAL_TABLET | Freq: Two times a day (BID) | ORAL | 0 refills | Status: AC

## 2021-12-22 MED ORDER — ONDANSETRON HCL 8 MG PO TABS: ORAL_TABLET | ORAL | 4 refills | Status: DC

## 2021-12-22 MED ORDER — LEVALBUTEROL TARTRATE 45 MCG/ACT IN AERO: INHALATION_SPRAY | RESPIRATORY_TRACT | 3 refills | Status: AC

## 2021-12-22 NOTE — Telephone Encounter (Signed)
Ms. christen, wardrop are scheduled for a virtual visit with your provider today.    Just as we do with appointments in the office, we must obtain your consent to participate.  Your consent will be active for this visit and any virtual visit you may have with one of our providers in the next 365 days.    If you have a MyChart account, I can also send a copy of this consent to you electronically.  All virtual visits are billed to your insurance company just like a traditional visit in the office.  As this is a virtual visit, video technology does not allow for your provider to perform a traditional examination.  This may limit your provider's ability to fully assess your condition.  If your provider identifies any concerns that need to be evaluated in person or the need to arrange testing such as labs, EKG, etc, we will make arrangements to do so.    Although advances in technology are sophisticated, we cannot ensure that it will always work on either your end or our end.  If the connection with a video visit is poor, we may have to switch to a telephone visit.  With either a video or telephone visit, we are not always able to ensure that we have a secure connection.   I need to obtain your verbal consent now.   Are you willing to proceed with your visit today?   Mikelle Lassen has provided verbal consent on 12/22/2021 for a virtual visit (video or telephone).   Vicente Males, LPN 1/[id number]/5883  2:54 AM

## 2021-12-22 NOTE — Progress Notes (Signed)
Subjective:    Patient ID: Penny [name], female    DOB: 1986/11/08, 36 y.o.   MRN: [id number]  HPI  Virtual Visit via Telephone Note  I connected with Penny [name] on 12/22/21 at 11:25 AM by telephone and verified that I am speaking with the correct person using two identifiers.  Location: Patient: home Provider: office   I discussed the limitations, risks, security and privacy concerns of performing an evaluation and management service by telephone and the availability of in person appointments. I also discussed with the patient that there may be a patient responsible charge related to this service. The patient expressed understanding and agreed to proceed.   History of Present Illness:   Patient presents today with respiratory illness. Patient tested positive for COVID this morning.   Number of days present- x1 day. Patient started having body aches yesterday. Patient states that she initially did not think her symptoms were serious as she had been doing a lot this weekend getting ready for their [place] vacation.   Symptoms include- headache, body aches from head to toe, sore throat, no taste. Pt states the last time she had COVID it set off a major asthma attack. Patient states that she is out of Xopenex and needs a refill.   Presence of worrisome signs (severe shortness of breath, lethargy, etc.) -none at the moment.   Recent/current visit to urgent care or ER- no  Recent direct exposure to Covid- yes. Mother and Step dad tested positive for COVID  Any current Covid testing- positive home test this morning  Pt would like refill on Zofran-prefers dissolvable tablet  Observations/Objective:  Patient answered questions appropriated. No SOB noted on televisit.   Assessment and Plan:  1. COVID - COVID is detected Under current CDC guidelines it is recommended to stay self isolated for at least 5 days.  If feeling well after 5 days may return to normal activities as long  as you wears a mask for 5 days.  If you are not feeling well after 5 days you should stay under self-isolation for 10 days.  Warning signs to watch for if you develops chest tightness shortness of breath severe pain change in mental status you should seek further evaluation in the ER.  If further questions or concerns please let us know - It is essential to keep Asthma well controlled as your body recovers. - Xopenex refilled - Zyrtec refilled -  - Prednisone ordered to prevent possible Asthma exacerbation if needed - May use OTC Ibuprofen for body aches and headaches - May use Zofran for nausea - Due to patient's multiple underlying health conditions, patient prescribed Paxlovid. S/E reviewed. Purpose of Paxlovid is to help prevent hospitalization but may not help relieve current symptoms. - Get plenty rest and stay hydrated   2. Mild intermittent asthma, unspecified whether complicated - Xopenx refilled. Use inhaler as needed for SOB - If Xopenex not relieving SOB may also use Prednisone - If SOB is severe or if inhaler and prednisone not helping with SOB, go to ED.   Follow Up Instructions:    I discussed the assessment and treatment plan with the patient. The patient was provided an opportunity to ask questions and all were answered. The patient agreed with the plan and demonstrated an understanding of the instructions.   The patient was advised to call back or seek an in-person evaluation if the symptoms worsen or if the condition fails to improve as anticipated.  I provided 20 minutes  of non-face-to-face time during this encounter.

## 2021-12-23 ENCOUNTER — Ambulatory Visit (HOSPITAL_COMMUNITY): Payer: Medicaid Other

## 2021-12-24 ENCOUNTER — Other Ambulatory Visit: Payer: Self-pay

## 2021-12-24 ENCOUNTER — Ambulatory Visit: Payer: Medicaid Other

## 2021-12-24 ENCOUNTER — Ambulatory Visit (HOSPITAL_COMMUNITY): Payer: Medicaid Other

## 2021-12-24 ENCOUNTER — Ambulatory Visit
Admission: EM | Admit: 2021-12-24 | Discharge: 2021-12-24 | Disposition: A | Discharge status: Home / Self Care | Payer: Medicaid Other | Attending: Family Medicine | Admitting: Family Medicine

## 2021-12-24 DIAGNOSIS — U071 COVID-19: Secondary | ICD-10-CM | POA: Diagnosis not present

## 2021-12-24 DIAGNOSIS — Z20828 Contact with and (suspected) exposure to other viral communicable diseases: Secondary | ICD-10-CM | POA: Diagnosis not present

## 2021-12-24 NOTE — ED Triage Notes (Signed)
Patient states she tested positive for Covid with an at home test  Patient states she has been having body aches, headache, cough and fever  Patient states her PCP called in her some Meds for the Covid but she would like to be tested again

## 2021-12-24 NOTE — ED Provider Notes (Signed)
[facility]    CSN: [id number] Arrival date & time: 12/24/21  1552      History   Chief Complaint Chief Complaint  Patient presents with   Sore Throat     Sore throat, cough and body aches    HPI Penny [name] is a 36 y.o. female.   Patient presenting today requesting COVID PCR test for confirmation of positive home COVID test.  She states she has already done a virtual visit with her primary care provider 2 days ago just after getting the positive result and having symptoms onset.  She was given a prescription for Paxlovid, prednisone in case asthma exacerbation and other supportive medications were also sent in.  She states she is having body aches, headache, cough, fever, congestion but denies any chest pain, shortness of breath, abdominal pain, dizziness, weakness.  Has been tolerating p.o. well.  Son now sick with similar symptoms and testing positive for COVID.   Past Medical History:  Diagnosis Date   ADD (attention deficit disorder)    Anemia 2011   2o to GASTRIC BYPASS   Anginal pain ([place])    Anxiety    Asthma    Blood transfusion without reported diagnosis    Chronic daily headache    Depression    Depression    Dysmenorrhea 09/18/2013   Dysrhythmia    Elevated liver enzymes JUL 2011ALK PHOS 111-127 AST  143-267 ALT  213-321T BILI 0.6  ALB  3.7-4.06 Jun 2011 ALK PHOS 118 AST 24 ALT 42 T BILI 0.4 ALB 3.9   Encounter for drug rehabilitation    at behavioral health for opioid addiction about 4 years ago   Family history of adverse reaction to anesthesia    'dad had to be kept on pump for breathing for morphine'   Fatty liver    Fibroid 01/18/2017   Fibromyalgia    Gastric bypass status for obesity    Gastritis JULY 2011   Heart murmur    Hereditary and idiopathic peripheral neuropathy 01/15/2015   History of Holter monitoring    Hx of opioid abuse ([place])    for about 4 years, about 4 years ago   Interstitial cystitis    Iron deficiency anemia  07/23/2010   Irritable bowel syndrome 2012 DIARRHEA   JUN 2012 TTG IgA 14.9   IUD FEB 2010   Lupus ([place])    Menorrhagia 07/18/2013   Migraines    Obesity (BMI 30-39.9) 2011 228 LBS BMI 36.8   Osteoporosis    Ovarian cyst    Patient desires pregnancy 09/18/2013   Polyneuropathy    PONV (postoperative nausea and vomiting) seizure post-operatively   seizure following ankle surgery   Potassium (K) deficiency    Pregnant 12/25/2013   Psychiatric pseudoseizure    RLQ abdominal pain 07/18/2013   Sciatica of left side 11/14/2014   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   Stress 09/18/2013    Patient Active Problem List   Diagnosis Date Noted   Dyspareunia in female 08/25/2021   Encounter for gynecological examination with Papanicolaou smear of cervix 08/25/2021   Pelvic cramping 08/25/2021   Screening mammogram for breast cancer 08/25/2021   Chronic prescription opiate use 12/25/2020   Sphincter of Oddi dysfunction 12/25/2020   Constipation 12/25/2020   Major depressive disorder, recurrent episode ([place]) 06/22/2020   Major depression 06/19/2020   Dehydration    Chronic abdominal pain 09/19/2019   Gastroesophageal reflux disease without esophagitis    Cannabis  abuse 08/30/2019   Dyspnea    Severe anxiety 08/21/2019   Absolute anemia 04/23/2019   Urge incontinence 10/26/2018   S/P kyphoplasty 08/02/2018   Herpes genitalis in women 12/19/2017   Cannabis use disorder, moderate, dependence ([place]) 07/07/2017   Abnormal urine odor 03/30/2017   Urinary tract infection with hematuria 03/30/2017   Screening for genitourinary condition 03/30/2017   Pain 03/30/2017   Burning with urination 03/30/2017   Family history of breast cancer in mother,uncertain BR CA status 02/28/2017   Fibroid 01/18/2017   Chronic pain syndrome 12/05/2016   Folate deficiency 12/04/2016   Anastomotic ulcer 04/22/2016   Pain medication agreement signed 04/22/2016   Complaints of leg weakness    Paresthesia     Left-sided weakness    Uncontrolled pain 01/24/2016   Malnutrition of moderate degree 01/24/2016   Neuropathy 01/23/2016   Inability to walk 01/23/2016   Paresthesias    Bilateral leg numbness 11/26/2015   Major depressive disorder, recurrent episode, 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[place]) 05/10/2015   Post partum depression 05/09/2015   Hematemesis 04/07/2015   Intractable vomiting with nausea 04/07/2015   Elevated liver enzymes    Epigastric pain    Pseudoseizure ([place]) 02/22/2015   Seizure-like activity ([place]) 02/22/2015   Fibromyalgia 02/18/2015   Vulvar fissure 02/18/2015   Clinical depression 02/01/2015   Current smoker 01/29/2015   Current tobacco use 01/29/2015   Hereditary and idiopathic peripheral neuropathy 01/15/2015   Leg weakness, bilateral 01/02/2015   Gait disorder 01/02/2015   Other symptoms and signs involving the musculoskeletal system 01/02/2015   Abnormal gait 01/02/2015   Cervicalgia 12/18/2014   Sciatica of left side 11/14/2014   Neuralgia neuritis, sciatic nerve 11/14/2014   Pseudoseizures ([place]) 09/12/2014   Encounter for sterilization 09/09/2014   Abdominal pain, acute, right lower quadrant 08/22/2014   Headache, migraine 08/19/2014   Seizure ([place]) 08/18/2014   Encephalopathy 08/13/2014   Headache 08/13/2014   Altered mental status 08/13/2014   Right leg numbness 07/31/2014   Disturbance of skin sensation 07/31/2014   Hypertension in pregnancy, transient 07/08/2014   Previous gastric bypass affecting pregnancy, antepartum 05/22/2014   History of bariatric surgery 05/22/2014   Patent foramen ovale with right to left shunt 05/17/2014   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   Antepartum mental disorder in pregnancy 05/09/2014   Rapid palpitations 02/18/2014   H/O maternal third degree perineal laceration, currently pregnant 02/18/2014   High-risk pregnancy 02/18/2014   Supervision of pregnancy with other poor reproductive or obstetric history,  unspecified trimester 02/18/2014   Restless legs 01/16/2014   Restless leg 01/16/2014   Chronic interstitial cystitis 10/23/2013   Menorrhagia 07/18/2013   Excessive and frequent menstruation 07/18/2013   h/o Opiate addiction 03/11/2012    Class: Acute   History of migraine headaches 03/10/2012    Class: Acute   Depression with anxiety 03/10/2012    Class: Chronic   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 03/10/2012    Class: Chronic   Attention deficit hyperactivity disorder, 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    Class: Chronic   Panic disorder without agoraphobia 03/10/2012   H/O disease 03/10/2012   Dysthymia 03/10/2012   Conversion disorder with seizures or convulsions 03/10/2012   Other specified behavioral and emotional disorders with onset usually occurring in childhood and adolescence 03/10/2012   Pelvic congestion syndrome 10/13/2011   Coitalgia 10/13/2011   Chronic migraine without aura 10/13/2011   Unspecified dyspareunia 10/13/2011   Other specified conditions associated with female genital organs  and menstrual cycle 10/13/2011   IBS (irritable bowel syndrome) 08/25/2011   OBESITY, UNSPECIFIED 09/17/2010   Iron deficiency anemia 07/23/2010    Past Surgical History:  Procedure Laterality Date   BIOPSY  04/10/2015   Procedure: BIOPSY;  Surgeon: Daneil Dolin, MD;  Location: [facility];  Service: Endoscopy;;   BIOPSY  04/15/2021   Procedure: BIOPSY;  Surgeon: Montez Morita, Quillian Quince, MD;  [facility];  Service: Gastroenterology;;   cath self every nite     for sodium bicarb injection (discontinued 2013)   CHOLECYSTECTOMY  2005   biliary dyskinesia   COLONOSCOPY  JUN 2012 ABD PN/DIARRHEA WITH PROPOFOL   NL COLON   COLONOSCOPY WITH PROPOFOL N/A 01/16/2021   Procedure: COLONOSCOPY WITH PROPOFOL;  Surgeon: Harvel Quale, MD;  [facility];  Service: Gastroenterology;  Laterality: N/A;  1:45   DILATION AND  CURETTAGE OF UTERUS     DILITATION & CURRETTAGE/HYSTROSCOPY WITH NOVASURE ABLATION N/A 03/24/2017   Procedure: DILATATION & CURETTAGE/HYSTEROSCOPY WITH NOVASURE ENDOMETRIAL ABLATION;  Surgeon: Jonnie Kind, MD;  Location: [facility];  Service: Gynecology;  Laterality: N/A;   ESOPHAGEAL DILATION N/A 04/10/2015   Procedure: ESOPHAGEAL DILATION WITH 54FR MALONEY DILATOR;  Surgeon: Daneil Dolin, MD;  Location: [facility];  Service: Endoscopy;  Laterality: N/A;   ESOPHAGOGASTRODUODENOSCOPY     ESOPHAGOGASTRODUODENOSCOPY (EGD) WITH PROPOFOL N/A 04/10/2015   Procedure: ESOPHAGOGASTRODUODENOSCOPY (EGD) WITH PROPOFOL;  Surgeon: Daneil Dolin, MD;  [facility];  Service: Endoscopy;  Laterality: N/A;   ESOPHAGOGASTRODUODENOSCOPY (EGD) WITH PROPOFOL N/A 12/06/2016   Procedure: ESOPHAGOGASTRODUODENOSCOPY (EGD) WITH PROPOFOL;  Surgeon: Danie Binder, MD;  [facility];  Service: Endoscopy;  Laterality: N/A;   ESOPHAGOGASTRODUODENOSCOPY (EGD) WITH PROPOFOL N/A 04/27/2019   Procedure: ESOPHAGOGASTRODUODENOSCOPY (EGD) WITH PROPOFOL;  Surgeon: Rogene Houston, MD;  [facility];  Service: Endoscopy;  Laterality: N/A;  730   ESOPHAGOGASTRODUODENOSCOPY (EGD) WITH PROPOFOL N/A 08/31/2019   Procedure: ESOPHAGOGASTRODUODENOSCOPY (EGD) WITH PROPOFOL;  Surgeon: Rogene Houston, MD;  [facility];  Service: Endoscopy;  Laterality: N/A;   ESOPHAGOGASTRODUODENOSCOPY (EGD) WITH PROPOFOL N/A 01/16/2021   Procedure: ESOPHAGOGASTRODUODENOSCOPY (EGD) WITH PROPOFOL;  Surgeon: Harvel Quale, MD;  [facility];  Service: Gastroenterology;  Laterality: N/A;   ESOPHAGOGASTRODUODENOSCOPY (EGD) WITH PROPOFOL N/A 04/15/2021   Procedure: ESOPHAGOGASTRODUODENOSCOPY (EGD) WITH PROPOFOL;  Surgeon: Harvel Quale, MD;  [facility];  Service: Gastroenterology;  Laterality: N/A;  9:00 AM   GAB  2007   in [place]-POUCH 5 CM   GASTRIC BYPASS  06/2006   HYSTEROSCOPY  WITH D & C N/A 09/12/2014   Procedure: DILATATION AND CURETTAGE /HYSTEROSCOPY;  Surgeon: Jonnie Kind, MD;  Location: [facility];  Service: Gynecology;  Laterality: N/A;   KYPHOPLASTY N/A 08/02/2018   Procedure: KYPHOPLASTY T12;  Surgeon: Melina Schools, MD;  Location: [facility];  Service: Orthopedics;  Laterality: N/A;  60 mins   LAPAROSCOPIC TUBAL LIGATION Bilateral 03/24/2017   Procedure: LAPAROSCOPIC TUBAL LIGATION ([place] Rings);  Surgeon: Jonnie Kind, MD;  Location: [facility];  Service: Gynecology;  Laterality: Bilateral;   REPAIR VAGINAL CUFF N/A 07/30/2014   Procedure: REPAIR VAGINAL CUFF;  Surgeon: Mora Bellman, MD;  Location: [facility];  Service: Gynecology;  Laterality: N/A;   SAVORY DILATION  06/20/2012   Dr. Barnie Alderman gastritis/Ulcer in the mid jejunum. Empiric dilation.    SURAL NERVE BX Left 02/25/2016   Procedure: LEFT SURAL NERVE BIOPSY;  Surgeon: Jovita Gamma,  MD;  Location: [facility];  Service: Neurosurgery;  Laterality: Left;  Left sural nerve biopsy   TONSILLECTOMY     TONSILLECTOMY AND ADENOIDECTOMY     UPPER GASTROINTESTINAL ENDOSCOPY  JULY 2011 NAUSEA-D125,V6, PH 25   Bx; GASTRITIS, POUCH-5 CM LONG   WISDOM TOOTH EXTRACTION      OB History     Gravida  3   Para  2   Term  1   Preterm  1   AB  1   Living  2      SAB  1   IAB      Ectopic      Multiple      Live Births  2            Home Medications    Prior to Admission medications   Medication Sig Start Date End Date Taking? Authorizing Provider  amphetamine-dextroamphetamine (ADDERALL XR) 30 MG 24 hr capsule Take 1 capsule (30 mg total) by mouth every morning. 12/09/21   Cloria Spring, MD  benzonatate (TESSALON) 100 MG capsule Take 1-2 capsules (100-200 mg total) by mouth 3 (three) times daily as needed for cough. 10/04/21   Jaynee Eagles, PA-C  Calcium Carb-Cholecalciferol (CALCIUM-VITAMIN D3) 600-500 MG-UNIT CAPS Take 1 tablet by mouth daily.    [provider]   Carboxymethylcellul-Glycerin (LUBRICATING EYE DROPS OP) Place 1 drop into both eyes daily as needed (dry eyes).    [provider]  cetirizine (ZYRTEC ALLERGY) 10 MG tablet Take 1 tablet (10 mg total) by mouth daily. 12/22/21   Ameduite, Trenton Gammon, NP  clidinium-chlordiazePOXIDE (LIBRAX) 5-2.5 MG capsule Take 1 capsule by mouth 3 (three) times daily before meals. 03/17/21   Harvel Quale, MD  clonazePAM (KLONOPIN) 0.5 MG tablet Take 1 tablet (0.5 mg total) by mouth 2 (two) times daily as needed. 12/09/21   Cloria Spring, MD  DULoxetine (CYMBALTA) 60 MG capsule Take 1 capsule (60 mg total) by mouth 2 (two) times daily. 12/09/21   Cloria Spring, MD  Erenumab-aooe (AIMOVIG) 140 MG/ML SOAJ Inject 140 mg into the skin every 28 (twenty-eight) days. 08/28/21   Narda Amber K, DO  fluconazole (DIFLUCAN) 150 MG tablet Take 1 tablet (150 mg total) by mouth once a week. 10/04/21   Jaynee Eagles, PA-C  fluticasone (FLOVENT HFA) 220 MCG/ACT inhaler Inhale 2 puffs into the lungs 2 (two) times daily. Do not inhale, swallow powder and drink water , do not eat food for 20 minutes after puffs 04/23/21   Montez Morita, Quillian Quince, MD  HYDROmorphone (DILAUDID) 4 MG tablet Take 4 mg by mouth every 4 (four) hours as needed for severe pain.    [provider]  lansoprazole (PREVACID) 30 MG capsule Take 1 capsule (30 mg total) by mouth daily. 04/15/21   Harvel Quale, MD  levalbuterol [facility] HFA) 45 MCG/ACT inhaler INHALE (2) PUFFS INTO THE LUNGS EVERY SIX HOURS AS NEEDED FOR WHEEZING. 12/22/21   Ameduite, Trenton Gammon, NP  levalbuterol [facility]) 1.25 MG/3ML nebulizer solution Take 1.25 mg by nebulization every 4 (four) hours as needed for wheezing. 02/24/21   Valentina Shaggy, MD  linaclotide [facility]) 290 MCG CAPS capsule Take 1 capsule (290 mcg total) by mouth daily before breakfast. 03/26/21   Montez Morita, Quillian Quince, MD  meclizine (ANTIVERT) 25 MG tablet Take 1 tablet (25 mg total)  by mouth 3 (three) times daily as needed for dizziness. 05/07/21   Mar Daring, PA-C  metroNIDAZOLE (METROGEL VAGINAL)  0.75 % vaginal gel Place 1 Applicatorful vaginally at bedtime. 08/31/21   Estill Dooms, NP  nirmatrelvir/ritonavir EUA, renal dosing, (PAXLOVID) 10 x 150 MG & 10 x $Re'100MG'mFx$  TABS Take 2 tablets by mouth 2 (two) times daily for 5 days. (Take nirmatrelvir 150 mg one tablet twice daily for 5 days and ritonavir 100 mg one tablet twice daily for 5 days) Patient GFR is 122 12/22/21 12/27/21  Ameduite, Trenton Gammon, NP  ondansetron (ZOFRAN) 8 MG tablet TAKE 1 TABLET EVERY 8 HOURS AS NEEDED FOR NAUSEA 12/22/21   Ameduite, Trenton Gammon, NP  pantoprazole (PROTONIX) 40 MG tablet Take 1 tablet (40 mg total) by mouth 2 (two) times daily before a meal. 10/16/21   Wolfgang Phoenix, Elayne Snare, MD  Pediatric Multivit-Minerals-C (FLINTSTONES GUMMIES) chewable tablet Chew 3 tablets by mouth daily.    [provider]  predniSONE (DELTASONE) 20 MG tablet 3 qd for 2d then 2qd for 2d then 1qd for 2d 12/22/21   Ameduite, Trenton Gammon, NP  promethazine (PHENERGAN) 12.5 MG tablet TAKE 1 TABLET BY MOUTH EVERY 8 HOURS AS NEEDED FOR NAUSEA OR VOMITING 03/17/21   Montez Morita, Quillian Quince, MD  promethazine (PHENERGAN) 25 MG tablet TAKE (1) TABLET BY MOUTH TWICE A DAY AS NEEDED. 11/21/21   Kathyrn Drown, MD  promethazine-dextromethorphan (PROMETHAZINE-DM) 6.25-15 MG/5ML syrup Take 5 mLs by mouth at bedtime as needed for cough. 10/04/21   Jaynee Eagles, PA-C  pseudoephedrine (SUDAFED) 60 MG tablet Take 1 tablet (60 mg total) by mouth every 8 (eight) hours as needed for congestion. 10/04/21   Jaynee Eagles, PA-C  Rimegepant Sulfate (NURTEC) 75 MG TBDP Take 1 tablet as needed for severe migraine.  Limit to twice per week. 09/18/21   Patel, Donika K, DO  Suvorexant (BELSOMRA) 20 MG TABS Take 20 mg by mouth at bedtime. 10/22/21   Cloria Spring, MD  tiZANidine (ZANAFLEX) 4 MG tablet Take 4 mg by mouth 3 (three) times daily as needed.  03/30/21   [provider]  traZODone (DESYREL) 100 MG tablet Take 2 tablets (200 mg total) by mouth at bedtime. 12/14/21   Cloria Spring, MD  Vitamin D, Ergocalciferol, (DRISDOL) 1.25 MG (50000 UNIT) CAPS capsule Take 50,000 Units by mouth every Wednesday.    [provider]  vortioxetine HBr (TRINTELLIX) 10 MG TABS tablet Take 1 tablet (10 mg total) by mouth daily. 12/09/21   Cloria Spring, MD    Family History Family History  Problem Relation Age of Onset   Coronary artery disease Paternal Grandfather    Migraines Paternal Grandmother    Breast cancer Paternal Grandmother    Hemochromatosis Maternal Grandmother    Migraines Maternal Grandmother    Cancer Maternal Grandmother    Breast cancer Maternal Grandmother    Hypertension Father    Diabetes Father    Coronary artery disease Father    Cancer Mother        breast   Hemochromatosis Mother    Breast cancer Mother    Depression Mother    Anxiety disorder Mother    Anxiety disorder Brother    Bipolar disorder Brother    Healthy Daughter    Healthy Son     Social History Social History   Tobacco Use   Smoking status: Former    Packs/day: 0.25    Years: 6.00    Pack years: 1.50    Types: Cigarettes    Start date: 05/2018   Smokeless tobacco: Never  Vaping Use  Vaping Use: Every day  Substance Use Topics   Alcohol use: Not Currently    Alcohol/week: 0.0 standard drinks   Drug use: Not Currently     Allergies   Gabapentin, Metoclopramide hcl, Tramadol, Ativan [lorazepam], Latex, Lyrica [pregabalin], Iron, Sulfa antibiotics, Butrans [buprenorphine], Nucynta [tapentadol], Propofol, Tape, and Toradol [ketorolac tromethamine]   Review of Systems Review of Systems Per HPI  Physical Exam Triage Vital Signs ED Triage Vitals  Enc Vitals Group     BP 12/24/21 1604 118/87     Pulse Rate 12/24/21 1604 (!) 135     Resp 12/24/21 1604 16     Temp 12/24/21 1604 98.1 F (36.7 C)     Temp Source  12/24/21 1604 Oral     SpO2 12/24/21 1604 98 %     Weight --      Height --      Head Circumference --      Peak Flow --      Pain Score 12/24/21 1601 5     Pain Loc --      Pain Edu? --      Excl. in [place]? --    No data found.  Updated Vital Signs BP 118/87 (BP Location: Right Arm)    Pulse (!) 135    Temp 98.1 F (36.7 C) (Oral)    Resp 16    SpO2 98%   Visual Acuity Right Eye Distance:   Left Eye Distance:   Bilateral Distance:    Right Eye Near:   Left Eye Near:    Bilateral Near:     Physical Exam Vitals and nursing note reviewed.  Constitutional:      Appearance: Normal appearance.  HENT:     Head: Atraumatic.     Right Ear: Tympanic membrane and external ear normal.     Left Ear: Tympanic membrane and external ear normal.     Nose: Rhinorrhea present.     Mouth/Throat:     Mouth: Mucous membranes are moist.     Pharynx: Posterior oropharyngeal erythema present.  Eyes:     Extraocular Movements: Extraocular movements intact.     Conjunctiva/sclera: Conjunctivae normal.  Cardiovascular:     Rate and Rhythm: Normal rate and regular rhythm.     Heart sounds: Normal heart sounds.  Pulmonary:     Effort: Pulmonary effort is normal.     Breath sounds: Normal breath sounds. No wheezing or rales.  Musculoskeletal:        General: Normal range of motion.     Cervical back: Normal range of motion and neck supple.  Skin:    General: Skin is warm and dry.  Neurological:     Mental Status: She is alert and oriented to person, place, and time.  Psychiatric:        Mood and Affect: Mood normal.        Thought Content: Thought content normal.   UC Treatments / Results  Labs (all labs ordered are listed, but only abnormal results are displayed) Labs Reviewed  COVID-19, FLU A+B NAA    EKG  Radiology No results found.  Procedures Procedures (including critical care time)  Medications Ordered in UC Medications - No data to display  Initial Impression /  Assessment and Plan / UC Course  I have reviewed the triage vital signs and the nursing notes.  Pertinent labs & imaging results that were available during my care of the patient were reviewed by me and considered in my  medical decision making (see chart for details).     Tachycardic in triage, otherwise vital signs reassuring.  Already received antivirals and symptomatic management from PCP 2 days ago, just requesting a PCR test.  Discussed supportive medications, home care and return precautions.  Final Clinical Impressions(s) / UC Diagnoses   Final diagnoses:  Exposure to the flu  COVID-19   Discharge Instructions   None    ED Prescriptions   None    PDMP not reviewed this encounter.   Volney American, [place] 12/24/21 1630

## 2021-12-25 ENCOUNTER — Ambulatory Visit (HOSPITAL_COMMUNITY): Payer: Medicaid Other

## 2021-12-25 DIAGNOSIS — G8929 Other chronic pain: Secondary | ICD-10-CM | POA: Diagnosis not present

## 2021-12-25 DIAGNOSIS — R051 Acute cough: Secondary | ICD-10-CM | POA: Diagnosis not present

## 2021-12-25 DIAGNOSIS — M5442 Lumbago with sciatica, left side: Secondary | ICD-10-CM | POA: Diagnosis not present

## 2021-12-25 DIAGNOSIS — M5441 Lumbago with sciatica, right side: Secondary | ICD-10-CM | POA: Diagnosis not present

## 2021-12-25 DIAGNOSIS — Z79899 Other long term (current) drug therapy: Secondary | ICD-10-CM | POA: Diagnosis not present

## 2021-12-25 LAB — COVID-19, FLU A+B NAA
Influenza A, NAA: NOT DETECTED
Influenza B, NAA: NOT DETECTED
SARS-CoV-2, NAA: DETECTED — AB

## 2021-12-29 ENCOUNTER — Telehealth (HOSPITAL_COMMUNITY): Payer: Self-pay | Admitting: Professional

## 2021-12-29 DIAGNOSIS — Z79899 Other long term (current) drug therapy: Secondary | ICD-10-CM | POA: Diagnosis not present

## 2021-12-31 ENCOUNTER — Encounter: Payer: Self-pay | Admitting: Family Medicine

## 2021-12-31 ENCOUNTER — Other Ambulatory Visit: Payer: Self-pay | Admitting: Family Medicine

## 2021-12-31 ENCOUNTER — Encounter: Payer: Self-pay | Admitting: Physician Assistant

## 2021-12-31 NOTE — Telephone Encounter (Signed)
Nurses It is not unusual with COVID to sometimes have a flareup She is outside of the window for more additional Paxlovid or other similar medicines If she is having stiffness in the neck with vomiting she should consider ER visit to rule out meningitis  It is also important to potentially cut down chronic pain, nerve medicine while feeling so sick.  Sometimes when very sick with a virus these medicines can be too strong.  I would not recommend stopping them completely but she may want to reduce her dosing by potentially 20 %/day  Also I would avoid use of muscle relaxers while she is feeling so bad.  If she feels she is getting into respiratory distress emergency department would be necessary as well  We can do a follow-up visit tomorrow if she feels that she has a condition that can be seen in the office.  If she would like we can do a visit tomorrow with myself-that would be her call  Tylenol as needed

## 2022-01-01 MED ORDER — AMOXICILLIN 500 MG PO TABS: ORAL_TABLET | ORAL | 0 refills | Status: DC

## 2022-01-01 NOTE — Telephone Encounter (Signed)
It is possible the discharge that you are having is just from the virus causing everything to be gray It is also possible there could be a mild sinus infection Certainly trying a trial of antibiotics to help for the possibility of sinus infection is reasonable Amoxicillin 500 mg 1 taken 3 times daily for 7 days If ongoing sickness will need to be seen

## 2022-01-07 NOTE — Telephone Encounter (Signed)
Cln called pt again. Pt reports she wants referrals for a therapist that can see her more often. Cln encourages pt to call customer service number to get accurate referrals. Pt agrees. Pt reports she is "torn" because her mother thinks she needs to do the PHP. Pt reports she is unsure and wants to contact insurance first for individual. Pt reports she will contact this cln at the beginning of next week if she wants to start PHP again. Cln agrees. Pt denies SI/HI/AVH

## 2022-01-26 ENCOUNTER — Telehealth (HOSPITAL_COMMUNITY): Payer: Self-pay | Admitting: Psychiatry

## 2022-01-26 NOTE — Telephone Encounter (Signed)
Called to schedule f/u appt, unable to leave vm

## 2022-02-01 DIAGNOSIS — Z79899 Other long term (current) drug therapy: Secondary | ICD-10-CM | POA: Diagnosis not present

## 2022-02-02 ENCOUNTER — Other Ambulatory Visit: Payer: Self-pay

## 2022-02-02 ENCOUNTER — Ambulatory Visit
Admission: RE | Admit: 2022-02-02 | Discharge: 2022-02-02 | Disposition: A | Discharge status: Home / Self Care | Payer: Medicaid Other | Source: Ambulatory Visit | Attending: Student | Admitting: Student

## 2022-02-02 VITALS — BP 103/70 | HR 102 | Temp 97.6°F | Resp 18

## 2022-02-02 DIAGNOSIS — S0501XA Injury of conjunctiva and corneal abrasion without foreign body, right eye, initial encounter: Secondary | ICD-10-CM

## 2022-02-02 MED ORDER — CIPROFLOXACIN HCL 0.3 % OP SOLN: 1.0000 [drp] | OPHTHALMIC | 0 refills | Status: AC

## 2022-02-02 NOTE — Discharge Instructions (Addendum)
-  You have a corneal abrasion. A corneal abrasion is a superficial scratch on the clear, protective "window" at the front of the eye (cornea). The cornea can be scratched by contact with dust, dirt, sand, wood shavings, plant matter, metal particles, contact lenses or even the edge of a piece of paper. -The eye is not infected, but we typically treat with an antibiotic drop to prevent infection. -Ciprofloxacin drops in the R eye every 4 hours while awake x7 days  -Warm compresses and dark room can help with the discomfort. -If your symptoms are getting worse instead of better, like eye pain, vision changes, eye pain with movement, eyelid swelling-follow-up with an ophthalmologist.

## 2022-02-02 NOTE — ED Provider Notes (Signed)
[facility]    CSN: [id number] Arrival date & time: 02/02/22  1300      History   Chief Complaint No chief complaint on file.   HPI Penny [name] is a 36 y.o. female presenting with foreign body sensation right eye for 2 days.  History extensive as below.  Describes removing the contact from her right eye and soon after developing foreign body sensation.  This is gotten progressively worse over the last 2 days.  Now with photophobia and eye watering.  Denies vision changes, flashes of light or floaters in field of vision, crusting, discharge from the eye.  States she called her eye doctor but they could not see her.  HPI  Past Medical History:  Diagnosis Date   ADD (attention deficit disorder)    Anemia 2011   2o to GASTRIC BYPASS   Anginal pain (HCC)    Anxiety    Asthma    Blood transfusion without reported diagnosis    Chronic daily headache    Depression    Depression    Dysmenorrhea 09/18/2013   Dysrhythmia    Elevated liver enzymes JUL 2011ALK PHOS 111-127 AST  143-267 ALT  213-321T BILI 0.6  ALB  3.7-4.06 Jun 2011 ALK PHOS 118 AST 24 ALT 42 T BILI 0.4 ALB 3.9   Encounter for drug rehabilitation    at behavioral health for opioid addiction about 4 years ago   Family history of adverse reaction to anesthesia    'dad had to be kept on pump for breathing for morphine'   Fatty liver    Fibroid 01/18/2017   Fibromyalgia    Gastric bypass status for obesity    Gastritis JULY 2011   Heart murmur    Hereditary and idiopathic peripheral neuropathy 01/15/2015   History of Holter monitoring    Hx of opioid abuse ([place])    for about 4 years, about 4 years ago   Interstitial cystitis    Iron deficiency anemia 07/23/2010   Irritable bowel syndrome 2012 DIARRHEA   JUN 2012 TTG IgA 14.9   IUD FEB 2010   Lupus ([place])    Menorrhagia 07/18/2013   Migraines    Obesity (BMI 30-39.9) 2011 228 LBS BMI 36.8   Osteoporosis    Ovarian cyst    Patient desires pregnancy  09/18/2013   Polyneuropathy    PONV (postoperative nausea and vomiting) seizure post-operatively   seizure following ankle surgery   Potassium (K) deficiency    Pregnant 12/25/2013   Psychiatric pseudoseizure    RLQ abdominal pain 07/18/2013   Sciatica of left side 11/14/2014   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   Stress 09/18/2013    Patient Active Problem List   Diagnosis Date Noted   Dyspareunia in female 08/25/2021   Encounter for gynecological examination with Papanicolaou smear of cervix 08/25/2021   Pelvic cramping 08/25/2021   Screening mammogram for breast cancer 08/25/2021   Chronic prescription opiate use 12/25/2020   Sphincter of Oddi dysfunction 12/25/2020   Constipation 12/25/2020   Major depressive disorder, recurrent episode ([place]) 06/22/2020   Major depression 06/19/2020   Dehydration    Chronic abdominal pain 09/19/2019   Gastroesophageal reflux disease without esophagitis    Cannabis abuse 08/30/2019   Dyspnea    Severe anxiety 08/21/2019   Absolute anemia 04/23/2019   Urge incontinence 10/26/2018   S/P kyphoplasty 08/02/2018   Herpes genitalis in women 12/19/2017   Cannabis use disorder, moderate, dependence ([place]) 07/07/2017  Abnormal urine odor 03/30/2017   Urinary tract infection with hematuria 03/30/2017   Screening for genitourinary condition 03/30/2017   Pain 03/30/2017   Burning with urination 03/30/2017   Family history of breast cancer in mother,uncertain BR CA status 02/28/2017   Fibroid 01/18/2017   Chronic pain syndrome 12/05/2016   Folate deficiency 12/04/2016   Anastomotic ulcer 04/22/2016   Pain medication agreement signed 04/22/2016   Complaints of leg weakness    Paresthesia    Left-sided weakness    Uncontrolled pain 01/24/2016   Malnutrition of moderate degree 01/24/2016   Neuropathy 01/23/2016   Inability to walk 01/23/2016   Paresthesias    Bilateral leg numbness 11/26/2015   Major depressive disorder, recurrent  episode, 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[place]) 05/10/2015   Post partum depression 05/09/2015   Hematemesis 04/07/2015   Intractable vomiting with nausea 04/07/2015   Elevated liver enzymes    Epigastric pain    Pseudoseizure ([place]) 02/22/2015   Seizure-like activity ([place]) 02/22/2015   Fibromyalgia 02/18/2015   Vulvar fissure 02/18/2015   Clinical depression 02/01/2015   Current smoker 01/29/2015   Current tobacco use 01/29/2015   Hereditary and idiopathic peripheral neuropathy 01/15/2015   Leg weakness, bilateral 01/02/2015   Gait disorder 01/02/2015   Other symptoms and signs involving the musculoskeletal system 01/02/2015   Abnormal gait 01/02/2015   Cervicalgia 12/18/2014   Sciatica of left side 11/14/2014   Neuralgia neuritis, sciatic nerve 11/14/2014   Pseudoseizures ([place]) 09/12/2014   Encounter for sterilization 09/09/2014   Abdominal pain, acute, right lower quadrant 08/22/2014   Headache, migraine 08/19/2014   Seizure ([place]) 08/18/2014   Encephalopathy 08/13/2014   Headache 08/13/2014   Altered mental status 08/13/2014   Right leg numbness 07/31/2014   Disturbance of skin sensation 07/31/2014   Hypertension in pregnancy, transient 07/08/2014   Previous gastric bypass affecting pregnancy, antepartum 05/22/2014   History of bariatric surgery 05/22/2014   Patent foramen ovale with right to left shunt 05/17/2014   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   Antepartum mental disorder in pregnancy 05/09/2014   Rapid palpitations 02/18/2014   H/O maternal third degree perineal laceration, currently pregnant 02/18/2014   High-risk pregnancy 02/18/2014   Supervision of pregnancy with other poor reproductive or obstetric history, unspecified trimester 02/18/2014   Restless legs 01/16/2014   Restless leg 01/16/2014   Chronic interstitial cystitis 10/23/2013   Menorrhagia 07/18/2013   Excessive and frequent menstruation 07/18/2013   h/o Opiate addiction 03/11/2012    Class: Acute    History of migraine headaches 03/10/2012    Class: Acute   Depression with anxiety 03/10/2012    Class: Chronic   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 03/10/2012    Class: Chronic   Attention deficit hyperactivity disorder, 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    Class: Chronic   Panic disorder without agoraphobia 03/10/2012   H/O disease 03/10/2012   Dysthymia 03/10/2012   Conversion disorder with seizures or convulsions 03/10/2012   Other specified behavioral and emotional disorders with onset usually occurring in childhood and adolescence 03/10/2012   Pelvic congestion syndrome 10/13/2011   Coitalgia 10/13/2011   Chronic migraine without aura 10/13/2011   Unspecified dyspareunia 10/13/2011   Other specified conditions associated with female genital organs and menstrual cycle 10/13/2011   IBS (irritable bowel syndrome) 08/25/2011   OBESITY, UNSPECIFIED 09/17/2010   Iron deficiency anemia 07/23/2010    Past Surgical History:  Procedure Laterality Date   BIOPSY  04/10/2015   Procedure: BIOPSY;  Surgeon: Herbie Baltimore  Hilton Cork, MD;  Location: [facility];  Service: Endoscopy;;   BIOPSY  04/15/2021   Procedure: BIOPSY;  Surgeon: Montez Morita, Quillian Quince, MD;  [facility];  Service: Gastroenterology;;   cath self every nite     for sodium bicarb injection (discontinued 2013)   CHOLECYSTECTOMY  2005   biliary dyskinesia   COLONOSCOPY  JUN 2012 ABD PN/DIARRHEA WITH PROPOFOL   NL COLON   COLONOSCOPY WITH PROPOFOL N/A 01/16/2021   Procedure: COLONOSCOPY WITH PROPOFOL;  Surgeon: Harvel Quale, MD;  [facility];  Service: Gastroenterology;  Laterality: N/A;  1:45   DILATION AND CURETTAGE OF UTERUS     DILITATION & CURRETTAGE/HYSTROSCOPY WITH NOVASURE ABLATION N/A 03/24/2017   Procedure: DILATATION & CURETTAGE/HYSTEROSCOPY WITH NOVASURE ENDOMETRIAL ABLATION;  Surgeon: Jonnie Kind, MD;  Location: [facility];  Service: Gynecology;   Laterality: N/A;   ESOPHAGEAL DILATION N/A 04/10/2015   Procedure: ESOPHAGEAL DILATION WITH 54FR MALONEY DILATOR;  Surgeon: Daneil Dolin, MD;  Location: [facility];  Service: Endoscopy;  Laterality: N/A;   ESOPHAGOGASTRODUODENOSCOPY     ESOPHAGOGASTRODUODENOSCOPY (EGD) WITH PROPOFOL N/A 04/10/2015   Procedure: ESOPHAGOGASTRODUODENOSCOPY (EGD) WITH PROPOFOL;  Surgeon: Daneil Dolin, MD;  [facility];  Service: Endoscopy;  Laterality: N/A;   ESOPHAGOGASTRODUODENOSCOPY (EGD) WITH PROPOFOL N/A 12/06/2016   Procedure: ESOPHAGOGASTRODUODENOSCOPY (EGD) WITH PROPOFOL;  Surgeon: Danie Binder, MD;  [facility];  Service: Endoscopy;  Laterality: N/A;   ESOPHAGOGASTRODUODENOSCOPY (EGD) WITH PROPOFOL N/A 04/27/2019   Procedure: ESOPHAGOGASTRODUODENOSCOPY (EGD) WITH PROPOFOL;  Surgeon: Rogene Houston, MD;  [facility];  Service: Endoscopy;  Laterality: N/A;  730   ESOPHAGOGASTRODUODENOSCOPY (EGD) WITH PROPOFOL N/A 08/31/2019   Procedure: ESOPHAGOGASTRODUODENOSCOPY (EGD) WITH PROPOFOL;  Surgeon: Rogene Houston, MD;  [facility];  Service: Endoscopy;  Laterality: N/A;   ESOPHAGOGASTRODUODENOSCOPY (EGD) WITH PROPOFOL N/A 01/16/2021   Procedure: ESOPHAGOGASTRODUODENOSCOPY (EGD) WITH PROPOFOL;  Surgeon: Harvel Quale, MD;  [facility];  Service: Gastroenterology;  Laterality: N/A;   ESOPHAGOGASTRODUODENOSCOPY (EGD) WITH PROPOFOL N/A 04/15/2021   Procedure: ESOPHAGOGASTRODUODENOSCOPY (EGD) WITH PROPOFOL;  Surgeon: Harvel Quale, MD;  [facility];  Service: Gastroenterology;  Laterality: N/A;  9:00 AM   GAB  2007   in [place]-POUCH 5 CM   GASTRIC BYPASS  06/2006   HYSTEROSCOPY WITH D & C N/A 09/12/2014   Procedure: DILATATION AND CURETTAGE /HYSTEROSCOPY;  Surgeon: Jonnie Kind, MD;  Location: [facility];  Service: Gynecology;  Laterality: N/A;   KYPHOPLASTY N/A 08/02/2018   Procedure: KYPHOPLASTY T12;  Surgeon: Melina Schools, MD;   Location: [facility];  Service: Orthopedics;  Laterality: N/A;  60 mins   LAPAROSCOPIC TUBAL LIGATION Bilateral 03/24/2017   Procedure: LAPAROSCOPIC TUBAL LIGATION ([place] Rings);  Surgeon: Jonnie Kind, MD;  Location: [facility];  Service: Gynecology;  Laterality: Bilateral;   REPAIR VAGINAL CUFF N/A 07/30/2014   Procedure: REPAIR VAGINAL CUFF;  Surgeon: Mora Bellman, MD;  Location: [facility];  Service: Gynecology;  Laterality: N/A;   SAVORY DILATION  06/20/2012   Dr. Barnie Alderman gastritis/Ulcer in the mid jejunum. Empiric dilation.    SURAL NERVE BX Left 02/25/2016   Procedure: LEFT SURAL NERVE BIOPSY;  Surgeon: Jovita Gamma, MD;  Location: [facility];  Service: Neurosurgery;  Laterality: Left;  Left sural nerve biopsy   TONSILLECTOMY     TONSILLECTOMY AND ADENOIDECTOMY     UPPER GASTROINTESTINAL ENDOSCOPY  JULY 2011 NAUSEA-D125,V6, PH 25   Bx; GASTRITIS,  POUCH-5 CM LONG   WISDOM TOOTH EXTRACTION      OB History     Gravida  3   Para  2   Term  1   Preterm  1   AB  1   Living  2      SAB  1   IAB      Ectopic      Multiple      Live Births  2            Home Medications    Prior to Admission medications   Medication Sig Start Date End Date Taking? Authorizing Provider  ciprofloxacin (CILOXAN) 0.3 % ophthalmic solution Place 1 drop into the right eye every 4 (four) hours while awake for 7 days. 02/02/22 02/09/22 Yes Hazel Sams, PA-C  amphetamine-dextroamphetamine (ADDERALL XR) 30 MG 24 hr capsule Take 1 capsule (30 mg total) by mouth every morning. 12/09/21   Cloria Spring, MD  benzonatate (TESSALON) 100 MG capsule Take 1-2 capsules (100-200 mg total) by mouth 3 (three) times daily as needed for cough. 10/04/21   Jaynee Eagles, PA-C  Calcium Carb-Cholecalciferol (CALCIUM-VITAMIN D3) 600-500 MG-UNIT CAPS Take 1 tablet by mouth daily.    [provider]  Carboxymethylcellul-Glycerin (LUBRICATING EYE DROPS OP) Place 1 drop into both eyes daily as needed (dry  eyes).    [provider]  cetirizine (ZYRTEC ALLERGY) 10 MG tablet Take 1 tablet (10 mg total) by mouth daily. 12/22/21   Ameduite, Trenton Gammon, NP  clidinium-chlordiazePOXIDE (LIBRAX) 5-2.5 MG capsule Take 1 capsule by mouth 3 (three) times daily before meals. 03/17/21   Harvel Quale, MD  clonazePAM (KLONOPIN) 0.5 MG tablet Take 1 tablet (0.5 mg total) by mouth 2 (two) times daily as needed. 12/09/21   Cloria Spring, MD  DULoxetine (CYMBALTA) 60 MG capsule Take 1 capsule (60 mg total) by mouth 2 (two) times daily. 12/09/21   Cloria Spring, MD  Erenumab-aooe (AIMOVIG) 140 MG/ML SOAJ Inject 140 mg into the skin every 28 (twenty-eight) days. 08/28/21   Narda Amber K, DO  fluconazole (DIFLUCAN) 150 MG tablet Take 1 tablet (150 mg total) by mouth once a week. 10/04/21   Jaynee Eagles, PA-C  fluticasone (FLOVENT HFA) 220 MCG/ACT inhaler Inhale 2 puffs into the lungs 2 (two) times daily. Do not inhale, swallow powder and drink water , do not eat food for 20 minutes after puffs 04/23/21   Montez Morita, Quillian Quince, MD  HYDROmorphone (DILAUDID) 4 MG tablet Take 4 mg by mouth every 4 (four) hours as needed for severe pain.    [provider]  lansoprazole (PREVACID) 30 MG capsule Take 1 capsule (30 mg total) by mouth daily. 04/15/21   Harvel Quale, MD  levalbuterol [facility] HFA) 45 MCG/ACT inhaler INHALE (2) PUFFS INTO THE LUNGS EVERY SIX HOURS AS NEEDED FOR WHEEZING. 12/22/21   Ameduite, Trenton Gammon, NP  levalbuterol [facility]) 1.25 MG/3ML nebulizer solution Take 1.25 mg by nebulization every 4 (four) hours as needed for wheezing. 02/24/21   Valentina Shaggy, MD  linaclotide [facility]) 290 MCG CAPS capsule Take 1 capsule (290 mcg total) by mouth daily before breakfast. 03/26/21   Montez Morita, Quillian Quince, MD  meclizine (ANTIVERT) 25 MG tablet Take 1 tablet (25 mg total) by mouth 3 (three) times daily as needed for dizziness. 05/07/21   Mar Daring, PA-C  ondansetron  (ZOFRAN) 8 MG tablet TAKE 1 TABLET EVERY 8 HOURS AS NEEDED FOR NAUSEA 12/22/21  Ameduite, Trenton Gammon, NP  pantoprazole (PROTONIX) 40 MG tablet Take 1 tablet (40 mg total) by mouth 2 (two) times daily before a meal. 10/16/21   Wolfgang Phoenix, Elayne Snare, MD  Pediatric Multivit-Minerals-C (FLINTSTONES GUMMIES) chewable tablet Chew 3 tablets by mouth daily.    [provider]  promethazine (PHENERGAN) 12.5 MG tablet TAKE 1 TABLET BY MOUTH EVERY 8 HOURS AS NEEDED FOR NAUSEA OR VOMITING 03/17/21   Montez Morita, Quillian Quince, MD  promethazine (PHENERGAN) 25 MG tablet TAKE (1) TABLET BY MOUTH TWICE A DAY AS NEEDED. 11/21/21   Kathyrn Drown, MD  Rimegepant Sulfate (NURTEC) 75 MG TBDP Take 1 tablet as needed for severe migraine.  Limit to twice per week. 09/18/21   Patel, Donika K, DO  Suvorexant (BELSOMRA) 20 MG TABS Take 20 mg by mouth at bedtime. 10/22/21   Cloria Spring, MD  tiZANidine (ZANAFLEX) 4 MG tablet Take 4 mg by mouth 3 (three) times daily as needed. 03/30/21   [provider]  traZODone (DESYREL) 100 MG tablet Take 2 tablets (200 mg total) by mouth at bedtime. 12/14/21   Cloria Spring, MD  Vitamin D, Ergocalciferol, (DRISDOL) 1.25 MG (50000 UNIT) CAPS capsule Take 50,000 Units by mouth every Wednesday.    [provider]  vortioxetine HBr (TRINTELLIX) 10 MG TABS tablet Take 1 tablet (10 mg total) by mouth daily. 12/09/21   Cloria Spring, MD    Family History Family History  Problem Relation Age of Onset   Coronary artery disease Paternal Grandfather    Migraines Paternal Grandmother    Breast cancer Paternal Grandmother    Hemochromatosis Maternal Grandmother    Migraines Maternal Grandmother    Cancer Maternal Grandmother    Breast cancer Maternal Grandmother    Hypertension Father    Diabetes Father    Coronary artery disease Father    Cancer Mother        breast   Hemochromatosis Mother    Breast cancer Mother    Depression Mother    Anxiety disorder Mother     Anxiety disorder Brother    Bipolar disorder Brother    Healthy Daughter    Healthy Son     Social History Social History   Tobacco Use   Smoking status: Former    Packs/day: 0.25    Years: 6.00    Pack years: 1.50    Types: Cigarettes    Start date: 05/2018   Smokeless tobacco: Never  Vaping Use   Vaping Use: Every day  Substance Use Topics   Alcohol use: Not Currently    Alcohol/week: 0.0 standard drinks   Drug use: Not Currently     Allergies   Gabapentin, Metoclopramide hcl, Tramadol, Ativan [lorazepam], Latex, Lyrica [pregabalin], Iron, Sulfa antibiotics, Butrans [buprenorphine], Nucynta [tapentadol], Propofol, Tape, and Toradol [ketorolac tromethamine]   Review of Systems Review of Systems  Eyes:  Positive for photophobia.  All other systems reviewed and are negative.   Physical Exam Triage Vital Signs ED Triage Vitals  Enc Vitals Group     BP 02/02/22 1324 103/70     Pulse Rate 02/02/22 1324 (!) 102     Resp 02/02/22 1324 18     Temp 02/02/22 1324 97.6 F (36.4 C)     Temp Source 02/02/22 1324 Oral     SpO2 02/02/22 1324 98 %     Weight --      Height --      Head Circumference --  Peak Flow --      Pain Score 02/02/22 1311 9     Pain Loc --      Pain Edu? --      Excl. in [place]? --    No data found.  Updated Vital Signs BP 103/70 (BP Location: Right Arm)    Pulse (!) 102    Temp 97.6 F (36.4 C) (Oral)    Resp 18    SpO2 98%   Visual Acuity Right Eye Distance: 20/50 (With correction) Left Eye Distance: 20/20 (With correction) Bilateral Distance: 20/20 (With correction)  Right Eye Near:   Left Eye Near:    Bilateral Near:     Physical Exam Vitals reviewed.  Constitutional:      Appearance: Normal appearance.  HENT:     Head: Normocephalic and atraumatic.     Right Ear: Tympanic membrane, ear canal and external ear normal. There is no impacted cerumen.     Left Ear: Tympanic membrane, ear canal and external ear normal. There is no  impacted cerumen.     Nose: Nose normal. No congestion.     Mouth/Throat:     Pharynx: Oropharynx is clear. No posterior oropharyngeal erythema.  Eyes:     General: Lids are normal. Lids are everted, no foreign bodies appreciated. Vision grossly intact. Gaze aligned appropriately. No visual field deficit.       Right eye: No foreign body, discharge or hordeolum.        Left eye: No foreign body, discharge or hordeolum.     Extraocular Movements: Extraocular movements intact.     Right eye: Normal extraocular motion and no nystagmus.     Left eye: Normal extraocular motion and no nystagmus.     Conjunctiva/sclera: Conjunctivae normal.     Right eye: Right conjunctiva is not injected. No chemosis, exudate or hemorrhage.    Left eye: Left conjunctiva is not injected. No chemosis, exudate or hemorrhage.    Pupils: Pupils are equal, round, and reactive to light.     Right eye: Fluorescein uptake present. No corneal abrasion. Seidel exam negative.     Left eye: No corneal abrasion or fluorescein uptake. Seidel exam negative.    Visual Fields: Right eye visual fields normal and left eye visual fields normal.      Comments: No foreign body present. R eye with 90mm vertical linear corneal abrasion overlying the lower iris. PERRLA, EOMI without pain. No orbital tenderness. Visual acuity intact wearing glasses.   Cardiovascular:     Rate and Rhythm: Normal rate and regular rhythm.     Heart sounds: Normal heart sounds.  Pulmonary:     Effort: Pulmonary effort is normal.     Breath sounds: Normal breath sounds.  Neurological:     General: No focal deficit present.     Mental Status: She is alert.  Psychiatric:        Mood and Affect: Mood normal.        Behavior: Behavior normal.        Thought Content: Thought content normal.        Judgment: Judgment normal.     UC Treatments / Results  Labs (all labs ordered are listed, but only abnormal results are displayed) Labs Reviewed - No data to  display  EKG   Radiology No results found.  Procedures Procedures (including critical care time)  Medications Ordered in UC Medications - No data to display  Initial Impression / Assessment and Plan / UC Course  I have reviewed the triage vital signs and the nursing notes.  Pertinent labs & imaging results that were available during my care of the patient were reviewed by me and considered in my medical decision making (see chart for details).     This patient is a very pleasant 36 y.o. year old female presenting with R corneal abrasion. Visual acuity intact wearing glasses. As she is a contact lens wearer, ciprofloxacin drops sent. F/u with optho if symptoms worsen or persist. ED return precautions discussed. Patient verbalizes understanding and agreement.    Final Clinical Impressions(s) / UC Diagnoses   Final diagnoses:  Abrasion of right cornea, initial encounter     Discharge Instructions      -You have a corneal abrasion. A corneal abrasion is a superficial scratch on the clear, protective "window" at the front of the eye (cornea). The cornea can be scratched by contact with dust, dirt, sand, wood shavings, plant matter, metal particles, contact lenses or even the edge of a piece of paper. -The eye is not infected, but we typically treat with an antibiotic drop to prevent infection. -Ciprofloxacin drops in the R eye every 4 hours while awake x7 days  -Warm compresses and dark room can help with the discomfort. -If your symptoms are getting worse instead of better, like eye pain, vision changes, eye pain with movement, eyelid swelling-follow-up with an ophthalmologist.      ED Prescriptions     Medication Sig Dispense Auth. Provider   ciprofloxacin (CILOXAN) 0.3 % ophthalmic solution Place 1 drop into the right eye every 4 (four) hours while awake for 7 days. 1.8 mL Hazel Sams, PA-C      PDMP not reviewed this encounter.   Hazel Sams, PA-C 02/02/22  1340

## 2022-02-02 NOTE — ED Triage Notes (Signed)
Right eye pain.  Thinks she may have a contact stuck in that eye.  Pain x 2 days.

## 2022-02-03 DIAGNOSIS — Z79899 Other long term (current) drug therapy: Secondary | ICD-10-CM | POA: Diagnosis not present

## 2022-02-04 ENCOUNTER — Other Ambulatory Visit (HOSPITAL_COMMUNITY): Payer: Self-pay | Admitting: Psychiatry

## 2022-02-05 NOTE — Telephone Encounter (Signed)
Call for appt

## 2022-02-09 ENCOUNTER — Other Ambulatory Visit: Payer: Self-pay

## 2022-02-09 ENCOUNTER — Ambulatory Visit (INDEPENDENT_AMBULATORY_CARE_PROVIDER_SITE_OTHER): Payer: Medicaid Other | Admitting: Clinical

## 2022-02-09 DIAGNOSIS — F411 Generalized anxiety disorder: Secondary | ICD-10-CM

## 2022-02-09 DIAGNOSIS — F332 Major depressive disorder, recurrent severe without psychotic features: Secondary | ICD-10-CM | POA: Diagnosis not present

## 2022-02-09 NOTE — Progress Notes (Signed)
?Virtual Visit via Telephone Note ?  ?I connected with Penny [name] on 02/09/22 at  9:00 AM EDT by telephone and verified that I am speaking with the correct person using two identifiers. ?  ?Location: ?Patient: Home ?Provider: Office ?  ?I discussed the limitations, risks, security and privacy concerns of performing an evaluation and management service by telephone and the availability of in person appointments. I also discussed with the patient that there may be a patient responsible charge related to this service. The patient expressed understanding and agreed to proceed. ?  ?  ?THERAPIST PROGRESS NOTE ?  ?Session Time: 9:00 AM-10:00 AM ?  ?Participation Level: Active ?  ?Behavioral Response: CasualAlertDepressed and NA ?  ?Type of Therapy: Individual Therapy ?  ?Treatment Goals addressed: Coping ?  ?Interventions: CBT, Motivational Interviewing, Strength-based and Supportive ?  ?Summary: Penny [name] is a 36 y.o. female who presents with GAD and MDD. The OPT therapist worked with the patient for her ongoing OPT treatment session. The OPT therapist utilized Motivational Interviewing to assist in creating therapeutic repore. The patient in the session was engaged and work in collaboration giving feedback about her triggers and symptoms over the past few weeks. The patient spoke about family related changes that have been triggering with her ex-husband getting re-married and the patients parents still having a close relationship with her ex-husband and being involved with the wedding. The OPT therapist provided psycho-education during the session. The OPT therapist worked with the patient on using her supports and communicating her feelings and focusing in areas that she has control in.The OPT therapist reviewed with the patient her upcoming health appointments. The patient spoke about her willingness to challenge negative thinking and not allow external factors she cannot control to escalate in its impact on her  mental health.  ?  ?Suicidal/Homicidal: Nowithout intent/plan ?  ?Therapist Response: The OPT therapist worked with the patient for the patients scheduled session. The patient was engaged in her session and gave feedback in relation to triggers, symptoms, and behavior responses over the past few weeks. The patient has been having difficulty managing symptoms of her GAD and MDD . The OPT therapist in session worked with the patient and utilized elements of CBT in working with the patient to help empower the patient to set healthy boundaries and put her effort into areas she has control to make decisions about. The OPT therapist worked with the patient on, positive thinking, communication of her feelings and not allowing automatic negative thoughts dominate, and use her coping strategies. Th OPT therapist worked with the patient on implementing a blue plan for her health in the 4 basic health needs areas of sleep,eating habits, hygiene, and physical exercise. The OPT therapist will continue to work with the patient for her ongoing OPT treatment. ?  ?Plan: Return again in 2/3 weeks. ?  ?Diagnosis:      Axis I: Depression and GAD ?  ?                        Axis II: No diagnosis ? ? ? ?Collaboration of Care: No additional collaboration of care for this session. ? ?Patient/Guardian was advised Release of Information must be obtained prior to any record release in order to collaborate their care with an outside provider. Patient/Guardian was advised if they have not already done so to contact the registration department to sign all necessary forms in order for [name] to release information regarding their care.  ? ?  Consent: Patient/Guardian gives verbal consent for treatment and assignment of benefits for services provided during this visit. Patient/Guardian expressed understanding and agreed to proceed.  ? ?  ?I discussed the assessment and treatment plan with the patient. The patient was provided an opportunity to ask  questions and all were answered. The patient agreed with the plan and demonstrated an understanding of the instructions. ?  ?The patient was advised to call back or seek an in-person evaluation if the symptoms worsen or if the condition fails to improve as anticipated. ?  ?I provided 55 minutes of non-face-to-face time during this encounter. ?  ?Lennox Grumbles, LCSW ? ?02/09/2022 ?  ? ? ?

## 2022-02-11 NOTE — Psych (Signed)
Virtual Visit via Video Note  I connected with Thula Pedraza on 12/17/21 at  9:00 AM EST by a video enabled telemedicine application and verified that I am speaking with the correct person using two identifiers.  Location: Patient: patient home Provider: clinical home office   I discussed the limitations of evaluation and management by telemedicine and the availability of in person appointments. The patient expressed understanding and agreed to proceed.  I discussed the assessment and treatment plan with the patient. The patient was provided an opportunity to ask questions and all were answered. The patient agreed with the plan and demonstrated an understanding of the instructions.   The patient was advised to call back or seek an in-person evaluation if the symptoms worsen or if the condition fails to improve as anticipated.  Pt was provided 240 minutes of non-face-to-face time during this encounter.   Lorin Glass, LCSW   [facility] [place] PHP THERAPIST PROGRESS NOTE  Sheniah Supak [id number]  Session Time: 9:00 - 10:00  Participation Level: Active  Behavioral Response: CasualAlertAnxious and Depressed  Type of Therapy: Group Therapy  Treatment Goals addressed: Coping  Progress Towards Goals: Initial  Interventions: CBT, DBT, Supportive, and Reframing  Summary: Clinician led check-in regarding current stressors and situation. Clinician utilized active listening and empathetic response and validated patient emotions. Clinician facilitated processing group on pertinent issues.   Therapist Response: Shalaunda Weatherholtz is a 36 y.o. female who presents with depression ad anxiety symptoms.  Patient arrived within time allowed and reports that she is feeling "sick." Patient rates her mood at a 4 on a scale of 1-10 with 10 being great. Pt reports she is still ill with her migraine and she has been in bed. Pt states she is not taking her medication due to needing to be alert while her kids are home.  Pt states struggle with disciplining her son.  Pt able to process. Pt engaged in discussion.         Session Time: 10:00 - 11:00   Participation Level: Active   Behavioral Response: CasualAlertDepressed   Type of Therapy: Group Therapy   Treatment Goals addressed: Coping   Interventions: CBT, DBT, Supportive and Reframing   Summary: Cln introduced DBT distress tolerance distraction skills. Cln provides context for distraction skills and why distraction is a foundational way to manage mood dysregulation. Group discussed how to apply distraction.     Therapist Response: Pt engaged in discussion and reports understanding of how distraction can be applied to manage big feelings.          Session Time: 11:00- 12:00   Participation Level: Active   Behavioral Response: CasualAlertDepressed   Type of Therapy: Group Therapy   Treatment Goals addressed: Coping   Interventions: CBT, DBT, Supportive and Reframing   Summary: Cln continued topic of CBT cognitive distortions and introduced thought challenging as a way to  utilize the "challenge" C in C-C-C. Group utilized Web designer questions" as a way to introduce challenges and reframe distorted thinking. Group members worked through pt examples to practice challenging distorted thinking.    Therapist Response: Pt engaged in discussion and demonstrates understanding of challenging distorted thoughts through practice.         Session Time: 12:00 -1:00   Participation Level: Active   Behavioral Response: CasualAlertDepressed   Type of Therapy: Group therapy   Treatment Goals addressed: Coping   Interventions: CBT; Solution focused; Supportive; Reframing   Summary: 12:00 - 12:50: Cln introduced topic of positive psychology. Group  discussed the 5 ways to train your brain to scan for the positive: conscious acts of kindness, meditation, exercise, postive event journaling, and gratitudes. Group members discussed how to  apply the principles in their every day life.  12:50 -1:00 Clinician led check-out. Clinician assessed for immediate needs, medication compliance and efficacy, and safety concerns   Therapist Response: 12:00 - 12:50: Pt engaged in discussion and reports she can start by utilizing gratitudes.  12:50 - 1:00: At check-out, patient rates her mood at a 4 on a scale of 1-10 with 10 being great. Pt reports afternoon plans of resting. Pt demonstrates some progress as evidenced by challenging negative thoughts. Patient denies SI/HI at the end of group.   Suicidal/Homicidal: Nowithout intent/plan  Plan: Pt will continue in PHP while working to decrease depression and anxiety symptoms, increase emotion regulation, and increase ability to manage symptoms in a healthy manner.   Collaboration of Care: Medication Management AEB TBobby Rumpf  Patient/Guardian was advised Release of Information must be obtained prior to any record release in order to collaborate their care with an outside provider. Patient/Guardian was advised if they have not already done so to contact the registration department to sign all necessary forms in order for [name] to release information regarding their care.   Consent: Patient/Guardian gives verbal consent for treatment and assignment of benefits for services provided during this visit. Patient/Guardian expressed understanding and agreed to proceed.   Diagnosis: Severe episode of recurrent major depressive disorder, without psychotic features ([place]) [F33.2]    1. Severe episode of recurrent major depressive disorder, without psychotic features ([place])   2. GAD (generalized anxiety disorder)       Lorin Glass, LCSW 02/11/2022

## 2022-02-11 NOTE — Psych (Signed)
Virtual Visit via Video Note  I connected with Penny [name] on 12/16/21 at  9:00 AM EST by a video enabled telemedicine application and verified that I am speaking with the correct person using two identifiers.  Location: Patient: patient home Provider: clinical home office   I discussed the limitations of evaluation and management by telemedicine and the availability of in person appointments. The patient expressed understanding and agreed to proceed.  I discussed the assessment and treatment plan with the patient. The patient was provided an opportunity to ask questions and all were answered. The patient agreed with the plan and demonstrated an understanding of the instructions.   The patient was advised to call back or seek an in-person evaluation if the symptoms worsen or if the condition fails to improve as anticipated.  Pt was provided 240 minutes of non-face-to-face time during this encounter.   Lorin Glass, LCSW   [facility] [place] [facility] THERAPIST PROGRESS NOTE  Penny [name] [id number]  Session Time: 9:00 - 10:00  Participation Level: Active  Behavioral Response: CasualAlertAnxious and Depressed  Type of Therapy: Group Therapy  Treatment Goals addressed: Coping  Progress Towards Goals: Initial  Interventions: CBT, DBT, Supportive, and Reframing  Summary: Clinician led check-in regarding current stressors and situation. Clinician utilized active listening and empathetic response and validated patient emotions. Clinician facilitated processing group on pertinent issues.   Therapist Response: Kinga Cassar is a 36 y.o. female who presents with depression and anxiety symptoms.  Patient arrived within time allowed and reports that she is feeling "ill." Patient rates her mood at a 5 on a scale of 1-10 with 10 being great. Pt reports she is experiencing a migraine and has been resting in the dark most of the day. Pt states spending time with her children yesterday and feeling stressed.  Pt able to process. Pt engaged in discussion.         Session Time: 10:00 - 11:00   Participation Level: Active   Behavioral Response: CasualAlertDepressed   Type of Therapy: Group Therapy   Treatment Goals addressed: Coping   Interventions: CBT, DBT, Supportive and Reframing   Summary: Cln led discussion on saying "no." Group members shared struggles they have with saying no and how it affects them. Cln utilized boundaries, communication, and self-esteem tenets to inform disussion.     Therapist Response: Pt engaged in discussion and reports increased understanding of how to say "no."          Session Time: 11:00- 12:00   Participation Level: Active   Behavioral Response: CasualAlertDepressed   Type of Therapy: Group Therapy   Treatment Goals addressed: Coping   Interventions: Supportive and Reframing   Summary: Spiritual care group   Therapist Response: Pt engaged with chaplain on topic.         Session Time: 12:00 -1:00   Participation Level: Active   Behavioral Response: CasualAlertDepressed   Type of Therapy: Group therapy   Treatment Goals addressed: Coping   Interventions: CBT; Solution focused; Supportive; Reframing   Summary: 12:00 - 12:50: Cln continued topic of CBT cognitive distortions and utilized handout "cognitive distortions" to continue to discuss common examples of distorted thoughts and group members worked to identify examples in their own life.  12:50 -1:00 Clinician led check-out. Clinician assessed for immediate needs, medication compliance and efficacy, and safety concerns   Therapist Response: 12:00 - 12:50: Pt engaged in discussion and is able to make connections from her life to the distorted thoughts.  12:50 - 1:00: At  check-out, patient rates her mood at a 4 on a scale of 1-10 with 10 being great. Pt reports afternoon plans of resting from her migraine. Pt demonstrates some progress as evidenced by coming to treatment despite  pain. Patient denies SI/HI at the end of group.   Suicidal/Homicidal: Nowithout intent/plan  Plan: Pt will continue in PHP while working to decrease depression and anxiety symptoms, increase emotion regulation, and increase ability to manage symptoms in a healthy manner.   Collaboration of Care: Medication Management AEB TBobby Rumpf  Patient/Guardian was advised Release of Information must be obtained prior to any record release in order to collaborate their care with an outside provider. Patient/Guardian was advised if they have not already done so to contact the registration department to sign all necessary forms in order for [name] to release information regarding their care.   Consent: Patient/Guardian gives verbal consent for treatment and assignment of benefits for services provided during this visit. Patient/Guardian expressed understanding and agreed to proceed.   Diagnosis: Severe episode of recurrent major depressive disorder, without psychotic features ([place]) [F33.2]    1. Severe episode of recurrent major depressive disorder, without psychotic features ([place])   2. GAD (generalized anxiety disorder)       Lorin Glass, LCSW 02/11/2022

## 2022-02-11 NOTE — Psych (Signed)
Virtual Visit via Video Note  I connected with Penny [name] on 12/14/21 at  9:00 AM EST by a video enabled telemedicine application and verified that I am speaking with the correct person using two identifiers.  Location: Patient: patient home Provider: clinical home office   I discussed the limitations of evaluation and management by telemedicine and the availability of in person appointments. The patient expressed understanding and agreed to proceed.  I discussed the assessment and treatment plan with the patient. The patient was provided an opportunity to ask questions and all were answered. The patient agreed with the plan and demonstrated an understanding of the instructions.   The patient was advised to call back or seek an in-person evaluation if the symptoms worsen or if the condition fails to improve as anticipated.  I provided 240 minutes of non-face-to-face time during this encounter.   Lorin Glass, LCSW   [facility] PHP THERAPIST PROGRESS NOTE  Penny [name] [id number]  Session Time: 9:00 - 10:00  Participation Level: Active  Behavioral Response: CasualAlertAnxious and Depressed  Type of Therapy: Group Therapy  Treatment Goals addressed: Coping  Progress Towards Goals: Initial  Interventions: CBT, DBT, Supportive, and Reframing  Summary: Clinician led check-in regarding current stressors and situation. Clinician utilized active listening and empathetic response and validated patient emotions. Clinician facilitated processing group on pertinent issues.   Therapist Response: Penny [name] is a 36 y.o. female who presents with depression ad anxiety symptoms.  Patient arrived within time allowed and reports that she is feeling "anxious." Patient rates her mood at a 5 on a scale of 1-10 with 10 being great. Pt reports feeling overwhelmed and with low mood. Pt reported spending the weekend helping care for her grandmother. Pt states weekends her kids spend with their  fathers are harder for her. Pt reports struggling with control. Pt able to process. Pt engaged in discussion.         Session Time: 10:00 - 11:00   Participation Level: Active   Behavioral Response: CasualAlertDepressed   Type of Therapy: Group Therapy   Treatment Goals addressed: Coping   Interventions: CBT, DBT, Supportive and Reframing   Summary: Cln led discussion on increasing support. Group members shared what supports they have and where it is lacking. Cln encouraged ways to increase adult friendships including accepting invitations, joining clubs/activities, reaching out, and support groups.     Therapist Response: Pt engaged in discussion and is able to determine ways to increase their support.          Session Time: 11:00- 12:00   Participation Level: Active   Behavioral Response: CasualAlertDepressed   Type of Therapy: Group Therapy   Treatment Goals addressed: Coping   Interventions: CBT, DBT, Supportive and Reframing   Summary: Cln led discussion on unrealistic expectations and the ways expectations can alter perspective. Group members discussed feelings and situations that have occurred due to expectation and how to know if they are unrealistic. Cln brought in CBT thought challenging and reframing to aid discussion.   Therapist Response: Pt engaged in discussion and reports gaining insight.          Session Time: 12:00 -1:00   Participation Level: Active   Behavioral Response: CasualAlertDepressed   Type of Therapy: Group therapy   Treatment Goals addressed: Coping   Interventions: CBT; Solution focused; Supportive; Reframing   Summary: 12:00 - 12:50: Cln led discussion on CBT reframing and how to look for alternative possibilities to decrease distress from negative thinking. Cln utilized  the CBT triangle to aid discussion. Group members shared difficult situations and worked with group to reframe the negative thinking present.  12:50 -1:00  Clinician led check-out. Clinician assessed for immediate needs, medication compliance and efficacy, and safety concerns   Therapist Response: 12:00 - 12:50: Pt engaged in reframing practice.  12:50 - 1:00: At check-out, patient rates her mood at a 5.5 on a scale of 1-10 with 10 being great. Pt reports afternoon plans of going to the chiropractor. Pt demonstrates some progress as evidenced by participating in first group session. Patient denies SI/HI at the end of group.   Suicidal/Homicidal: Nowithout intent/plan  Plan: Pt will continue in PHP while working to decrease depression and anxiety symptoms, increase emotion regulation, and increase ability to manage symptoms in a healthy manner.   Collaboration of Care: Medication Management AEB TBobby Rumpf  Patient/Guardian was advised Release of Information must be obtained prior to any record release in order to collaborate their care with an outside provider. Patient/Guardian was advised if they have not already done so to contact the registration department to sign all necessary forms in order for [name] to release information regarding their care.   Consent: Patient/Guardian gives verbal consent for treatment and assignment of benefits for services provided during this visit. Patient/Guardian expressed understanding and agreed to proceed.   Diagnosis: Severe episode of recurrent major depressive disorder, without psychotic features ([place]) [F33.2]    1. Severe episode of recurrent major depressive disorder, without psychotic features ([place])   2. GAD (generalized anxiety disorder)       Lorin Glass, LCSW 02/11/2022

## 2022-02-18 ENCOUNTER — Other Ambulatory Visit: Payer: Self-pay

## 2022-02-18 ENCOUNTER — Encounter (HOSPITAL_COMMUNITY): Payer: Self-pay | Admitting: Psychiatry

## 2022-02-18 ENCOUNTER — Telehealth (INDEPENDENT_AMBULATORY_CARE_PROVIDER_SITE_OTHER): Payer: Medicaid Other | Admitting: Psychiatry

## 2022-02-18 DIAGNOSIS — Z79899 Other long term (current) drug therapy: Secondary | ICD-10-CM | POA: Diagnosis not present

## 2022-02-18 DIAGNOSIS — F411 Generalized anxiety disorder: Secondary | ICD-10-CM | POA: Diagnosis not present

## 2022-02-18 DIAGNOSIS — F332 Major depressive disorder, recurrent severe without psychotic features: Secondary | ICD-10-CM

## 2022-02-18 DIAGNOSIS — M5442 Lumbago with sciatica, left side: Secondary | ICD-10-CM | POA: Diagnosis not present

## 2022-02-18 DIAGNOSIS — F900 Attention-deficit hyperactivity disorder, predominantly inattentive type: Secondary | ICD-10-CM | POA: Diagnosis not present

## 2022-02-18 DIAGNOSIS — M5441 Lumbago with sciatica, right side: Secondary | ICD-10-CM | POA: Diagnosis not present

## 2022-02-18 DIAGNOSIS — B9689 Other specified bacterial agents as the cause of diseases classified elsewhere: Secondary | ICD-10-CM | POA: Diagnosis not present

## 2022-02-18 DIAGNOSIS — J329 Chronic sinusitis, unspecified: Secondary | ICD-10-CM | POA: Diagnosis not present

## 2022-02-18 MED ORDER — CLONAZEPAM 0.5 MG PO TABS: 0.5000 mg | ORAL_TABLET | Freq: Two times a day (BID) | ORAL | 2 refills | Status: DC | PRN

## 2022-02-18 MED ORDER — TRAZODONE HCL 100 MG PO TABS: 200.0000 mg | ORAL_TABLET | Freq: Every day | ORAL | 2 refills | Status: DC

## 2022-02-18 MED ORDER — DULOXETINE HCL 60 MG PO CPEP: 60.0000 mg | ORAL_CAPSULE | Freq: Two times a day (BID) | ORAL | 2 refills | Status: DC

## 2022-02-18 MED ORDER — AMPHETAMINE-DEXTROAMPHET ER 30 MG PO CP24: 30.0000 mg | ORAL_CAPSULE | ORAL | 0 refills | Status: DC

## 2022-02-18 MED ORDER — AMPHETAMINE-DEXTROAMPHET ER 30 MG PO CP24: ORAL_CAPSULE | ORAL | 0 refills | Status: DC

## 2022-02-18 NOTE — Progress Notes (Signed)
Virtual Visit via Telephone Note ? ?I connected with Penny [name] on 02/18/22 at  1:00 PM EDT by telephone and verified that I am speaking with the correct person using two identifiers. ? ?Location: ?Patient: home ?Provider: office ?  ?I discussed the limitations, risks, security and privacy concerns of performing an evaluation and management service by telephone and the availability of in person appointments. I also discussed with the patient that there may be a patient responsible charge related to this service. The patient expressed understanding and agreed to proceed. ? ? ? ? ?  ?I discussed the assessment and treatment plan with the patient. The patient was provided an opportunity to ask questions and all were answered. The patient agreed with the plan and demonstrated an understanding of the instructions. ?  ?The patient was advised to call back or seek an in-person evaluation if the symptoms worsen or if the condition fails to improve as anticipated. ? ?I provided 15 minutes of non-face-to-face time during this encounter. ? ? ?[name], MD ? ?BH MD/PA/NP OP Progress Note ? ?02/18/2022 1:28 PM ?Penny [name]  ?MRN:  [id number] ? ?Chief Complaint:  ?Chief Complaint  ?Patient presents with  ? Depression  ? Anxiety  ? ADD  ? Follow-up  ? ?HPI: This patient is a 36 year old female who is now living alone in [place].  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  She has 2 children with whom she shares custody with their fathers ? ?The patient returns for follow-up after 2 months.  She states that she got COVID again last month for the second time.  She was sick for a while but is feeling better.  She still has a lot of fatigue.  When she does not have her children and they are staying with their fathers she does not have much motivation to do anything.  She is somewhat depressed but is trying to work on it with her therapist.  She is trying to find hobbies and activities to keep busy.  She her  parents and children are going on a visit to [place] next week and she is looking forward to it.  She is generally sleeping well.  She has not been able to take the Trintellix because it made her very nauseated.  She is still trying to take Subutex prescribed by pain management. ? ?The patient does seem to be trying harder to have a more reasonable lifestyle with more structure.  She denies any thoughts of self-harm or suicide. ?Visit Diagnosis:  ?  ICD-10-CM   ?1. Severe episode of recurrent major depressive disorder, without psychotic features ([place])  F33.2   ?  ?2. GAD (generalized anxiety disorder)  F41.1   ?  ?3. Attention deficit hyperactivity disorder (ADHD), predominantly inattentive type  F90.0   ?  ? ? ?Past Psychiatric History: Long-term outpatient treatment, hospitalization last year for depression ? ?Past Medical History:  ?Past Medical History:  ?Diagnosis Date  ? ADD (attention deficit disorder)   ? Anemia 2011  ? 2o to GASTRIC BYPASS  ? Anginal pain ([place])   ? Anxiety   ? Asthma   ? Blood transfusion without reported diagnosis   ? Chronic daily headache   ? Depression   ? Depression   ? Dysmenorrhea 09/18/2013  ? Dysrhythmia   ? Elevated liver enzymes JUL 2011ALK PHOS 111-127 AST  143-267 ALT  213-321T BILI 0.6  ALB  3.7-4.1   ? JUL 2012 ALK PHOS 118  AST 24 ALT 42 T BILI 0.4 ALB 3.9  ? Encounter for drug rehabilitation   ? at behavioral health for opioid addiction about 4 years ago  ? Family history of adverse reaction to anesthesia   ? 'dad had to be kept on pump for breathing for morphine'  ? Fatty liver   ? Fibroid 01/18/2017  ? Fibromyalgia   ? Gastric bypass status for obesity   ? Gastritis JULY 2011  ? Heart murmur   ? Hereditary and idiopathic peripheral neuropathy 01/15/2015  ? History of Holter monitoring   ? Hx of opioid abuse ([place])   ? for about 4 years, about 4 years ago  ? Interstitial cystitis   ? Iron deficiency anemia 07/23/2010  ? Irritable bowel syndrome 2012 DIARRHEA  ? JUN 2012  TTG IgA 14.9  ? IUD FEB 2010  ? Lupus ([place])   ? Menorrhagia 07/18/2013  ? Migraines   ? Obesity (BMI 30-39.9) 2011 228 LBS BMI 36.8  ? Osteoporosis   ? Ovarian cyst   ? Patient desires pregnancy 09/18/2013  ? Polyneuropathy   ? PONV (postoperative nausea and vomiting) seizure post-operatively  ? seizure following ankle surgery  ? Potassium (K) deficiency   ? Pregnant 12/25/2013  ? Psychiatric pseudoseizure   ? RLQ abdominal pain 07/18/2013  ? Sciatica of left side 11/14/2014  ? 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 non-epileptic  ? Stress 09/18/2013  ?  ?Past Surgical History:  ?Procedure Laterality Date  ? BIOPSY  04/10/2015  ? Procedure: BIOPSY;  Surgeon: Daneil Dolin, MD;  [facility]: [facility];  Service: Endoscopy;;  ? BIOPSY  04/15/2021  ? Procedure: BIOPSY;  Surgeon: Montez Morita, Quillian Quince, MD;  [facility];  Service: Gastroenterology;;  ? cath self every nite    ? for sodium bicarb injection (discontinued 2013)  ? CHOLECYSTECTOMY  2005  ? biliary dyskinesia  ? COLONOSCOPY  JUN 2012 ABD PN/DIARRHEA WITH PROPOFOL  ? NL COLON  ? COLONOSCOPY WITH PROPOFOL N/A 01/16/2021  ? Procedure: COLONOSCOPY WITH PROPOFOL;  Surgeon: Harvel Quale, MD;  [facility];  Service: Gastroenterology;  Laterality: N/A;  1:45  ? DILATION AND CURETTAGE OF UTERUS    ? DILITATION & CURRETTAGE/HYSTROSCOPY WITH NOVASURE ABLATION N/A 03/24/2017  ? Procedure: DILATATION & CURETTAGE/HYSTEROSCOPY WITH NOVASURE ENDOMETRIAL ABLATION;  Surgeon: Jonnie Kind, MD;  Location: [facility];  Service: Gynecology;  Laterality: N/A;  ? ESOPHAGEAL DILATION N/A 04/10/2015  ? Procedure: ESOPHAGEAL DILATION WITH 54FR MALONEY DILATOR;  Surgeon: Daneil Dolin, MD;  Location: [facility];  Service: Endoscopy;  Laterality: N/A;  ? ESOPHAGOGASTRODUODENOSCOPY    ? ESOPHAGOGASTRODUODENOSCOPY (EGD) WITH PROPOFOL N/A 04/10/2015  ? Procedure: ESOPHAGOGASTRODUODENOSCOPY (EGD) WITH PROPOFOL;  Surgeon: Daneil Dolin, MD;  [facility];  Service: Endoscopy;   Laterality: N/A;  ? ESOPHAGOGASTRODUODENOSCOPY (EGD) WITH PROPOFOL N/A 12/06/2016  ? Procedure: ESOPHAGOGASTRODUODENOSCOPY (EGD) WITH PROPOFOL;  Surgeon: Danie Binder, MD;  [facility];  Service: Endoscopy;  Laterality: N/A;  ? ESOPHAGOGASTRODUODENOSCOPY (EGD) WITH PROPOFOL N/A 04/27/2019  ? Procedure: ESOPHAGOGASTRODUODENOSCOPY (EGD) WITH PROPOFOL;  Surgeon: Rogene Houston, MD;  [facility];  Service: Endoscopy;  Laterality: N/A;  730  ? ESOPHAGOGASTRODUODENOSCOPY (EGD) WITH PROPOFOL N/A 08/31/2019  ? Procedure: ESOPHAGOGASTRODUODENOSCOPY (EGD) WITH PROPOFOL;  Surgeon: Rogene Houston, MD;  [facility];  Service: Endoscopy;  Laterality: N/A;  ? ESOPHAGOGASTRODUODENOSCOPY (EGD) WITH PROPOFOL N/A 01/16/2021  ? Procedure: ESOPHAGOGASTRODUODENOSCOPY (EGD) WITH PROPOFOL;  Surgeon: Harvel Quale,  MD;  [facility];  Service: Gastroenterology;  Laterality: N/A;  ? ESOPHAGOGASTRODUODENOSCOPY (EGD) WITH PROPOFOL N/A 04/15/2021  ? Procedure: ESOPHAGOGASTRODUODENOSCOPY (EGD) WITH PROPOFOL;  Surgeon: Harvel Quale, MD;  [facility];  Service: Gastroenterology;  Laterality: N/A;  9:00 AM  ? GAB  2007  ? in [place]-POUCH 5 CM  ? GASTRIC BYPASS  06/2006  ? HYSTEROSCOPY WITH D & C N/A 09/12/2014  ? Procedure: DILATATION AND CURETTAGE /HYSTEROSCOPY;  Surgeon: Jonnie Kind, MD;  [facility];  Service: Gynecology;  Laterality: N/A;  ? KYPHOPLASTY N/A 08/02/2018  ? Procedure: KYPHOPLASTY T12;  Surgeon: Melina Schools, MD;  Location: [place];  Service: Orthopedics;  Laterality: N/A;  60 mins  ? LAPAROSCOPIC TUBAL LIGATION Bilateral 03/24/2017  ? Procedure: LAPAROSCOPIC TUBAL LIGATION ([place] Rings);  Surgeon: Jonnie Kind, MD;  [facility];  Service: Gynecology;  Laterality: Bilateral;  ? REPAIR VAGINAL CUFF N/A 07/30/2014  ? Procedure: REPAIR VAGINAL CUFF;  Surgeon: Mora Bellman, MD;  Location: [facility];  Service: Gynecology;  Laterality:  N/A;  ? SAVORY DILATION  06/20/2012  ? Dr. Barnie Alderman gastritis/Ulcer in the mid jejunum. Empiric dilation.   ? SURAL NERVE BX Left 02/25/2016  ? Procedure: LEFT SURAL NERVE BIOPSY;  Surgeon: Jovita Gamma, Jerilynn Mages

## 2022-02-19 ENCOUNTER — Other Ambulatory Visit: Payer: Self-pay | Admitting: Allergy & Immunology

## 2022-02-19 DIAGNOSIS — R0602 Shortness of breath: Secondary | ICD-10-CM

## 2022-02-22 ENCOUNTER — Other Ambulatory Visit (INDEPENDENT_AMBULATORY_CARE_PROVIDER_SITE_OTHER): Payer: Self-pay | Admitting: Gastroenterology

## 2022-02-22 ENCOUNTER — Other Ambulatory Visit: Payer: Self-pay

## 2022-02-22 ENCOUNTER — Ambulatory Visit (INDEPENDENT_AMBULATORY_CARE_PROVIDER_SITE_OTHER): Payer: Medicaid Other | Admitting: Clinical

## 2022-02-22 DIAGNOSIS — F332 Major depressive disorder, recurrent severe without psychotic features: Secondary | ICD-10-CM

## 2022-02-22 DIAGNOSIS — F411 Generalized anxiety disorder: Secondary | ICD-10-CM | POA: Diagnosis not present

## 2022-02-22 DIAGNOSIS — K589 Irritable bowel syndrome without diarrhea: Secondary | ICD-10-CM

## 2022-02-22 NOTE — Progress Notes (Signed)
Virtual Visit via Telephone Note ?  ?I connected with Penny [name] on 02/22/22 at  8:00 AM EDT by telephone and verified that I am speaking with the correct person using two identifiers. ?  ?Location: ?Patient: Home ?Provider: Office ?  ?I discussed the limitations, risks, security and privacy concerns of performing an evaluation and management service by telephone and the availability of in person appointments. I also discussed with the patient that there may be a patient responsible charge related to this service. The patient expressed understanding and agreed to proceed. ?  ?  ?THERAPIST PROGRESS NOTE ?  ?Session Time: 8:00 AM-8:45 AM ?  ?Participation Level: Active ?  ?Behavioral Response: CasualAlertDepressed and NA ?  ?Type of Therapy: Individual Therapy ?  ?Treatment Goals addressed: Coping ?  ?Interventions: CBT, Motivational Interviewing, Strength-based and Supportive ?  ?Summary: Penny [name] is a 36 y.o. female who presents with GAD and MDD. The OPT therapist worked with the patient for her ongoing OPT treatment session. The OPT therapist utilized Motivational Interviewing to assist in creating therapeutic repore. The patient in the session was engaged and work in collaboration giving feedback about her triggers and symptoms over the past few weeks. The patient spoke about escalation in the home with her teen and the OPT therapist spoke about filing a undisciplined minors petition and IIH. The OPT therapist provided psycho-education during the session. The OPT therapist worked with the patient on using her supports and communicating her feelings and focusing in areas that she has control in.The OPT therapist reviewed with the patient her upcoming health appointments. The patient spoke about her willingness to challenge negative thinking and not allow external factors she cannot control to escalate in its impact on her mental health.  ?  ?Suicidal/Homicidal: Nowithout intent/plan ?  ?Therapist Response: The  OPT therapist worked with the patient for the patients scheduled session. The patient was engaged in her session and gave feedback in relation to triggers, symptoms, and behavior responses over the past few weeks. The patient has been having difficulty managing symptoms of her GAD and MDD . The OPT therapist in session worked with the patient and utilized elements of CBT in working with the patient to help empower the patient to set healthy boundaries and put her effort into areas she has control to make decisions about. The OPT therapist worked with the patient on, positive thinking, communication of her feelings and not allowing automatic negative thoughts dominate, and use her coping strategies. Th OPT therapist worked with the patient on regaining control in her home in parenting her teen. The OPT therapist will continue to work with the patient for her ongoing OPT treatment. ?  ?Plan: Return again in 2/3 weeks. ?  ?Diagnosis:      Axis I: Depression and GAD ?  ?                        Axis II: No diagnosis ?  ?  ?  ?Collaboration of Care: No additional collaboration of care for this session. ?  ?Patient/Guardian was advised Release of Information must be obtained prior to any record release in order to collaborate their care with an outside provider. Patient/Guardian was advised if they have not already done so to contact the registration department to sign all necessary forms in order for [name] to release information regarding their care.  ?  ?Consent: Patient/Guardian gives verbal consent for treatment and assignment of benefits for services provided during  this visit. Patient/Guardian expressed understanding and agreed to proceed.  ?  ?  ?I discussed the assessment and treatment plan with the patient. The patient was provided an opportunity to ask questions and all were answered. The patient agreed with the plan and demonstrated an understanding of the instructions. ?  ?The patient was advised to call back or  seek an in-person evaluation if the symptoms worsen or if the condition fails to improve as anticipated. ?  ?I provided 45 minutes of non-face-to-face time during this encounter. ?  ?Lennox Grumbles, LCSW ?  ?02/22/2022 ?  ?

## 2022-02-26 ENCOUNTER — Encounter: Payer: Self-pay | Admitting: Neurology

## 2022-03-03 ENCOUNTER — Encounter: Payer: Self-pay | Admitting: Neurology

## 2022-03-05 DIAGNOSIS — Z79899 Other long term (current) drug therapy: Secondary | ICD-10-CM | POA: Diagnosis not present

## 2022-03-05 DIAGNOSIS — E7800 Pure hypercholesterolemia, unspecified: Secondary | ICD-10-CM | POA: Diagnosis not present

## 2022-03-05 DIAGNOSIS — R5383 Other fatigue: Secondary | ICD-10-CM | POA: Diagnosis not present

## 2022-03-05 DIAGNOSIS — E559 Vitamin D deficiency, unspecified: Secondary | ICD-10-CM | POA: Diagnosis not present

## 2022-03-08 ENCOUNTER — Other Ambulatory Visit: Payer: Self-pay

## 2022-03-08 ENCOUNTER — Emergency Department (HOSPITAL_COMMUNITY)
Admission: EM | Admit: 2022-03-08 | Discharge: 2022-03-08 | Disposition: A | Discharge status: Home / Self Care | Payer: Medicaid Other | Attending: Emergency Medicine | Admitting: Emergency Medicine

## 2022-03-08 ENCOUNTER — Emergency Department (HOSPITAL_COMMUNITY): Payer: Medicaid Other

## 2022-03-08 ENCOUNTER — Encounter (HOSPITAL_COMMUNITY): Payer: Self-pay

## 2022-03-08 ENCOUNTER — Ambulatory Visit: Payer: Medicaid Other

## 2022-03-08 DIAGNOSIS — R9431 Abnormal electrocardiogram [ECG] [EKG]: Secondary | ICD-10-CM | POA: Diagnosis not present

## 2022-03-08 DIAGNOSIS — J329 Chronic sinusitis, unspecified: Secondary | ICD-10-CM | POA: Diagnosis not present

## 2022-03-08 DIAGNOSIS — S299XXA Unspecified injury of thorax, initial encounter: Secondary | ICD-10-CM | POA: Diagnosis present

## 2022-03-08 DIAGNOSIS — Z91040 Latex allergy status: Secondary | ICD-10-CM | POA: Diagnosis not present

## 2022-03-08 DIAGNOSIS — W01198A Fall on same level from slipping, tripping and stumbling with subsequent striking against other object, initial encounter: Secondary | ICD-10-CM | POA: Insufficient documentation

## 2022-03-08 DIAGNOSIS — R079 Chest pain, unspecified: Secondary | ICD-10-CM | POA: Diagnosis not present

## 2022-03-08 DIAGNOSIS — S20212A Contusion of left front wall of thorax, initial encounter: Secondary | ICD-10-CM | POA: Insufficient documentation

## 2022-03-08 DIAGNOSIS — S0990XA Unspecified injury of head, initial encounter: Secondary | ICD-10-CM | POA: Diagnosis not present

## 2022-03-08 MED ORDER — OXYCODONE-ACETAMINOPHEN 5-325 MG PO TABS
2.0000 | ORAL_TABLET | Freq: Once | ORAL | Status: AC
  Administered 2022-03-08: 2 via ORAL
  Filled 2022-03-08: qty 2

## 2022-03-08 NOTE — ED Triage Notes (Addendum)
Pt arrived from home via POV w c/o pain in upper left chest status post falling in to dresser. Pt states that she has had the pain for more than a week, tonight got dizzy and fell against dresser hitting mid left chest after leaving pain clinic , zero noted marks.  States pain is 10/10 ?

## 2022-03-08 NOTE — ED Provider Notes (Signed)
?[facility] ?Provider Note ? ? ?CSN: [id number] ?Arrival date & time: 03/08/22  0156 ? ?  ? ?History ? ?Chief Complaint  ?Patient presents with  ? Chest Pain  ? ? ?Penny [name] is a 36 y.o. female. ? ?Patient presents to the emergency department for evaluation of chest pain.  Patient reports that she turned several days ago and felt a strong pulling on the left side of her chest and has been having pain with movement ever since.  Tonight she lost her balance and fell, hitting the left side of her chest on her dresser.  She then hit her head on the way down.  No loss of consciousness. ? ? ?  ? ?Home Medications ?Prior to Admission medications   ?Medication Sig Start Date End Date Taking? Authorizing Provider  ?amphetamine-dextroamphetamine (ADDERALL XR) 30 MG 24 hr capsule TAKE 1 CAPSULE BY MOUTH ONCE EVERY MORNING. 02/18/22   Cloria Spring, MD  ?amphetamine-dextroamphetamine (ADDERALL XR) 30 MG 24 hr capsule Take 1 capsule (30 mg total) by mouth every morning. 02/18/22   Cloria Spring, MD  ?benzonatate (TESSALON) 100 MG capsule Take 1-2 capsules (100-200 mg total) by mouth 3 (three) times daily as needed for cough. 10/04/21   Jaynee Eagles, PA-C  ?Calcium Carb-Cholecalciferol (CALCIUM-VITAMIN D3) 600-500 MG-UNIT CAPS Take 1 tablet by mouth daily.    [provider]  ?Carboxymethylcellul-Glycerin (LUBRICATING EYE DROPS OP) Place 1 drop into both eyes daily as needed (dry eyes).    [provider]  ?cetirizine (ZYRTEC ALLERGY) 10 MG tablet Take 1 tablet (10 mg total) by mouth daily. 12/22/21   Ameduite, Trenton Gammon, NP  ?clidinium-chlordiazePOXIDE (LIBRAX) 5-2.5 MG capsule TAKE (1) CAPSULE BY MOUTH 3 TIMES DAILY BEFORE MEALS. 02/22/22   Harvel Quale, MD  ?DULoxetine (CYMBALTA) 60 MG capsule Take 1 capsule (60 mg total) by mouth 2 (two) times daily. 02/18/22   Cloria Spring, MD  ?Eduard Roux (AIMOVIG) 140 MG/ML SOAJ Inject 140 mg into the skin every 28 (twenty-eight)  days. 08/28/21   Narda Amber K, DO  ?fluconazole (DIFLUCAN) 150 MG tablet Take 1 tablet (150 mg total) by mouth once a week. 10/04/21   Jaynee Eagles, PA-C  ?fluticasone (FLOVENT HFA) 220 MCG/ACT inhaler Inhale 2 puffs into the lungs 2 (two) times daily. Do not inhale, swallow powder and drink water , do not eat food for 20 minutes after puffs 04/23/21   Montez Morita, Quillian Quince, MD  ?HYDROmorphone (DILAUDID) 4 MG tablet Take 4 mg by mouth every 4 (four) hours as needed for severe pain.    [provider]  ?lansoprazole (PREVACID) 30 MG capsule Take 1 capsule (30 mg total) by mouth daily. 04/15/21   Harvel Quale, MD  ?levalbuterol [facility] HFA) 45 MCG/ACT inhaler INHALE (2) PUFFS INTO THE LUNGS EVERY SIX HOURS AS NEEDED FOR WHEEZING. 12/22/21   Ameduite, Trenton Gammon, NP  ?levalbuterol Penne Lash) 1.25 MG/3ML nebulizer solution Take 1.25 mg by nebulization every 4 (four) hours as needed for wheezing. 02/24/21   Valentina Shaggy, MD  ?linaclotide Rolan Lipa) 290 MCG CAPS capsule Take 1 capsule (290 mcg total) by mouth daily before breakfast. 03/26/21   Harvel Quale, MD  ?meclizine (ANTIVERT) 25 MG tablet Take 1 tablet (25 mg total) by mouth 3 (three) times daily as needed for dizziness. 05/07/21   Mar Daring, PA-C  ?ondansetron (ZOFRAN) 8 MG tablet TAKE 1 TABLET EVERY 8 HOURS AS NEEDED FOR NAUSEA 12/22/21   Ameduite, Trenton Gammon, NP  ?  pantoprazole (PROTONIX) 40 MG tablet Take 1 tablet (40 mg total) by mouth 2 (two) times daily before a meal. 10/16/21   Wolfgang Phoenix, Elayne Snare, MD  ?Pediatric Multivit-Minerals-C (FLINTSTONES GUMMIES) chewable tablet Chew 3 tablets by mouth daily.    [provider]  ?promethazine (PHENERGAN) 12.5 MG tablet TAKE 1 TABLET BY MOUTH EVERY 8 HOURS AS NEEDED FOR NAUSEA OR VOMITING 03/17/21   Montez Morita, Quillian Quince, MD  ?promethazine (PHENERGAN) 25 MG tablet TAKE (1) TABLET BY MOUTH TWICE A DAY AS NEEDED. 11/21/21   Kathyrn Drown, MD  ?Rimegepant Sulfate  (NURTEC) 75 MG TBDP Take 1 tablet as needed for severe migraine.  Limit to twice per week. 09/18/21   Narda Amber K, DO  ?tiZANidine (ZANAFLEX) 4 MG tablet Take 4 mg by mouth 3 (three) times daily as needed. 03/30/21   [provider]  ?traZODone (DESYREL) 100 MG tablet Take 2 tablets (200 mg total) by mouth at bedtime. 02/18/22   Cloria Spring, MD  ?Vitamin D, Ergocalciferol, (DRISDOL) 1.25 MG (50000 UNIT) CAPS capsule Take 50,000 Units by mouth every Wednesday.    [provider]  ?   ? ?Allergies    ?Gabapentin, Metoclopramide hcl, Tramadol, Ativan [lorazepam], Latex, Lyrica [pregabalin], Iron, Sulfa antibiotics, Butrans [buprenorphine], Nucynta [tapentadol], Propofol, Tape, and Toradol [ketorolac tromethamine]   ? ?Review of Systems   ?Review of Systems ? ?Physical Exam ?Updated Vital Signs ?BP 117/78   Pulse 78   Temp 98.6 ?F (37 ?C) (Oral)   Resp 18   SpO2 95%  ?Physical Exam ?Vitals and nursing note reviewed.  ?Constitutional:   ?   General: She is not in acute distress. ?   Appearance: She is well-developed.  ?HENT:  ?   Head: Normocephalic and atraumatic.  ?   Mouth/Throat:  ?   Mouth: Mucous membranes are moist.  ?Eyes:  ?   General: Vision grossly intact. Gaze aligned appropriately.  ?   Extraocular Movements: Extraocular movements intact.  ?   Conjunctiva/sclera: Conjunctivae normal.  ?Cardiovascular:  ?   Rate and Rhythm: Normal rate and regular rhythm.  ?   Pulses: Normal pulses.  ?   Heart sounds: Normal heart sounds, S1 normal and S2 normal. No murmur heard. ?  No friction rub. No gallop.  ?Pulmonary:  ?   Effort: Pulmonary effort is normal. No respiratory distress.  ?   Breath sounds: Normal breath sounds.  ?Chest:  ?   Chest wall: Tenderness present.  ? ? ?Abdominal:  ?   General: Bowel sounds are normal.  ?   Palpations: Abdomen is soft.  ?   Tenderness: There is no abdominal tenderness. There is no guarding or rebound.  ?   Hernia: No hernia is present.  ?Musculoskeletal:      ?   General: No swelling.  ?   Cervical back: Full passive range of motion without pain, normal range of motion and neck supple. No spinous process tenderness or muscular tenderness. Normal range of motion.  ?   Right lower leg: No edema.  ?   Left lower leg: No edema.  ?Skin: ?   General: Skin is warm and dry.  ?   Capillary Refill: Capillary refill takes less than 2 seconds.  ?   Findings: No ecchymosis, erythema, rash or wound.  ?Neurological:  ?   General: No focal deficit present.  ?   Mental Status: She is alert and oriented to person, place, and time.  ?   GCS:  GCS eye subscore is 4. GCS verbal subscore is 5. GCS motor subscore is 6.  ?   Cranial Nerves: Cranial nerves 2-12 are intact.  ?   Sensory: Sensation is intact.  ?   Motor: Motor function is intact.  ?   Coordination: Coordination is intact.  ?Psychiatric:     ?   Attention and Perception: Attention normal.     ?   Mood and Affect: Mood normal.     ?   Speech: Speech normal.     ?   Behavior: Behavior normal.  ? ? ?ED Results / Procedures / Treatments   ?Labs ?(all labs ordered are listed, but only abnormal results are displayed) ?Labs Reviewed - No data to display ? ?EKG ?EKG Interpretation ? ?Date/Time:  Monday March 08 2022 02:13:22 EDT ?Ventricular Rate:  79 ?PR Interval:  172 ?QRS Duration: 89 ?QT Interval:  394 ?QTC Calculation: 452 ?R Axis:   43 ?Text Interpretation: Sinus rhythm Low voltage, precordial leads Confirmed by Orpah Greek [phone]) on 03/08/2022 3:35:47 AM ? ?Radiology ?DG Ribs Unilateral W/Chest Left ? ?Result Date: 03/08/2022 ?CLINICAL DATA:  Chest pain after a fall yesterday. EXAM: LEFT RIBS AND CHEST - 3+ VIEW COMPARISON:  05/07/2021 FINDINGS: Normal heart size and pulmonary vascularity. No focal airspace disease or consolidation in the lungs. No blunting of costophrenic angles. No pneumothorax. Mediastinal contours appear intact. The left ribs appear intact. No acute displaced fractures identified. No focal bone lesion  or bone destruction. Soft tissues are unremarkable. IMPRESSION: No evidence of active pulmonary disease.  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 Electronically Signed   By: Lucienne Capers M.D.   On: 03/08/2022 03:27  ? ?CT HE

## 2022-03-09 ENCOUNTER — Telehealth: Payer: Self-pay

## 2022-03-09 NOTE — Telephone Encounter (Signed)
Transition Care Management Follow-up Telephone Call ?Date of discharge and from where: 03/08/2022-[name]  ?How have you been since you were released from the hospital? Pt stated she is doing fine.  ?Any questions or concerns? No ? ?Items Reviewed: ?Did the pt receive and understand the discharge instructions provided? Yes  ?Medications obtained and verified? Yes  ?Other? No  ?Any new allergies since your discharge? No  ?Dietary orders reviewed? No ?Do you have support at home? Yes  ? ?Home Care and Equipment/Supplies: ?Were home health services ordered? not applicable ?If so, what is the name of the agency? N/A  ?Has the agency set up a time to come to the patient's home? not applicable ?Were any new equipment or medical supplies ordered?  No ?What is the name of the medical supply agency? N/A ?Were you able to get the supplies/equipment? not applicable ?Do you have any questions related to the use of the equipment or supplies? No ? ?Functional Questionnaire: (I = Independent and D = Dependent) ?ADLs: I ? ?Bathing/Dressing- I ? ?Meal Prep- I ? ?Eating- I ? ?Maintaining continence- I ? ?Transferring/Ambulation- I ? ?Managing Meds- I ? ?Follow up appointments reviewed: ? ?PCP Hospital f/u appt confirmed? No   ?Specialist Hospital f/u appt confirmed? No   ?Are transportation arrangements needed? No  ?If their condition worsens, is the pt aware to call PCP or go to the Emergency Dept.? Yes ?Was the patient provided with contact information for the PCP's office or ED? Yes ?Was to pt encouraged to call back with questions or concerns? Yes  ?

## 2022-03-11 DIAGNOSIS — Z79899 Other long term (current) drug therapy: Secondary | ICD-10-CM | POA: Diagnosis not present

## 2022-03-16 ENCOUNTER — Telehealth (HOSPITAL_COMMUNITY): Payer: Self-pay | Admitting: Clinical

## 2022-03-16 ENCOUNTER — Ambulatory Visit (HOSPITAL_COMMUNITY): Payer: Medicaid Other | Admitting: Clinical

## 2022-03-16 NOTE — Telephone Encounter (Signed)
The patient did not respond to contact attempts.  

## 2022-03-30 ENCOUNTER — Ambulatory Visit (HOSPITAL_COMMUNITY): Payer: Medicaid Other | Admitting: Clinical

## 2022-03-30 ENCOUNTER — Ambulatory Visit: Payer: Medicaid Other | Admitting: Family Medicine

## 2022-03-30 ENCOUNTER — Other Ambulatory Visit (HOSPITAL_COMMUNITY): Payer: Self-pay | Admitting: Adult Health

## 2022-03-30 ENCOUNTER — Encounter: Payer: Self-pay | Admitting: Family Medicine

## 2022-03-30 DIAGNOSIS — Z1231 Encounter for screening mammogram for malignant neoplasm of breast: Secondary | ICD-10-CM

## 2022-03-31 ENCOUNTER — Encounter (HOSPITAL_COMMUNITY): Payer: Self-pay

## 2022-03-31 ENCOUNTER — Encounter: Payer: Self-pay | Admitting: Family Medicine

## 2022-03-31 ENCOUNTER — Ambulatory Visit (INDEPENDENT_AMBULATORY_CARE_PROVIDER_SITE_OTHER): Payer: Medicaid Other | Admitting: Family Medicine

## 2022-03-31 ENCOUNTER — Ambulatory Visit (HOSPITAL_COMMUNITY): Admission: RE | Admit: 2022-03-31 | Payer: Medicaid Other | Source: Ambulatory Visit

## 2022-03-31 VITALS — BP 104/72 | HR 104 | Temp 98.1°F | Ht 65.0 in | Wt 159.0 lb

## 2022-03-31 DIAGNOSIS — K5900 Constipation, unspecified: Secondary | ICD-10-CM | POA: Diagnosis not present

## 2022-03-31 DIAGNOSIS — K289 Gastrojejunal ulcer, unspecified as acute or chronic, without hemorrhage or perforation: Secondary | ICD-10-CM

## 2022-03-31 DIAGNOSIS — M2550 Pain in unspecified joint: Secondary | ICD-10-CM

## 2022-03-31 DIAGNOSIS — Z1231 Encounter for screening mammogram for malignant neoplasm of breast: Secondary | ICD-10-CM

## 2022-03-31 MED ORDER — LINACLOTIDE 290 MCG PO CAPS: 290.0000 ug | ORAL_CAPSULE | Freq: Every day | ORAL | 3 refills | Status: DC

## 2022-03-31 MED ORDER — LANSOPRAZOLE 30 MG PO CPDR: 30.0000 mg | DELAYED_RELEASE_CAPSULE | Freq: Every day | ORAL | 1 refills | Status: DC

## 2022-03-31 NOTE — Patient Instructions (Signed)
Recommend b12 testing. ? ?Consider seeing neurology for confirmation of carpal tunnel syndrome (nerve conduction study). ? ?Continue your meds. ? ?Let us know if we can help in any other way. ? ?Take care ? ?Dr. Lacinda Axon  ?

## 2022-03-31 NOTE — Progress Notes (Signed)
? ?Subjective:  ?Patient ID: Penny [name], female    DOB: [birth year]  Age: 36 y.o. MRN: [id number] ? ?CC: ?Chief Complaint  ?Patient presents with  ? Numbness  ?  In hands and feet - difficult to put shoes on  ?Refill meds  ? swelling all over  ?  With joint pain 2 to 3 months  ?Wants note sent to Dr Wolfgang Phoenix - to contact her mother who is also a pt about starting her disability paperwork per lawyer   ? ? ?HPI: ? ?36 year old female with an extensive past medical history as outlined below presents with the above complaints. ? ?Patient reports diffuse swelling.  It primarily affects the hands, wrists, and feet.  She states that she has significant pain.  She also reports associated numbness and tingling.  Patient states that this has been going on for the past 2 to 3 months.  No known inciting factor.  Patient has known underlying neuropathy as well as chronic pain.  No relieving factors.  No other complaints at this time. ? ?Patient Active Problem List  ? Diagnosis Date Noted  ? Joint pain 04/01/2022  ? Chronic prescription opiate use 12/25/2020  ? Major depressive disorder, recurrent episode ([place]) 06/22/2020  ? Chronic abdominal pain 09/19/2019  ? Gastroesophageal reflux disease without esophagitis   ? Severe anxiety 08/21/2019  ? Urge incontinence 10/26/2018  ? S/P kyphoplasty 08/02/2018  ? Herpes genitalis in women 12/19/2017  ? Cannabis use disorder, moderate, dependence ([place]) 07/07/2017  ? Urinary tract infection with hematuria 03/30/2017  ? Family history of breast cancer in mother,uncertain BR CA status 02/28/2017  ? Chronic pain syndrome 12/05/2016  ? Folate deficiency 12/04/2016  ? Anastomotic ulcer 04/22/2016  ? Pain medication agreement signed 04/22/2016  ? Neuropathy 01/23/2016  ? Major depressive disorder, recurrent episode, 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[place]) 05/10/2015  ? Fibromyalgia 02/18/2015  ? Vulvar fissure 02/18/2015  ? Current smoker 01/29/2015  ? Current tobacco use 01/29/2015  ? Sciatica of  left side 11/14/2014  ? 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 Hypertension in pregnancy, transient 07/08/2014  ? Previous gastric bypass affecting pregnancy, antepartum 05/22/2014  ? History of bariatric surgery 05/22/2014  ? Patent foramen ovale with right to left shunt 05/17/2014  ? 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  ? Rapid palpitations 02/18/2014  ? Supervision of pregnancy with other poor reproductive or obstetric history, unspecified trimester 02/18/2014  ? Restless legs 01/16/2014  ? Restless leg 01/16/2014  ? Chronic interstitial cystitis 10/23/2013  ? h/o Opiate addiction 03/11/2012  ?  Class: Acute  ? Depression with anxiety 03/10/2012  ?  Class: Chronic  ? 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 03/10/2012  ?  Class: Chronic  ? Attention deficit hyperactivity disorder, 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  ?  Class: Chronic  ? Panic disorder without agoraphobia 03/10/2012  ? H/O disease 03/10/2012  ? Dysthymia 03/10/2012  ? Conversion disorder with seizures or convulsions 03/10/2012  ? Other specified behavioral and emotional disorders with onset usually occurring in childhood and adolescence 03/10/2012  ? Pelvic congestion syndrome 10/13/2011  ? Chronic migraine without aura 10/13/2011  ? Other specified conditions associated with female genital organs and menstrual cycle 10/13/2011  ? IBS (irritable bowel syndrome) 08/25/2011  ? Iron deficiency anemia 07/23/2010  ? ? ?Social Hx   ?Social History  ? ?Socioeconomic History  ? Marital status: Divorced  ?  Spouse name: Not on file  ? Number of children: 2  ? Years of education: some college  ?  Highest education level: Some college, no degree  ?Occupational History  ? Occupation: Applying for [employer]  ?  Employer: NOT EMPLOYED  ?Tobacco Use  ? Smoking status: Former  ?  Packs/day: 0.25  ?  Years: 6.00  ?  Pack years: 1.50  ?  Types: Cigarettes  ?  Start date: 05/2018  ? Smokeless tobacco: Never  ?Vaping Use  ? Vaping  Use: Every day  ?Substance and Sexual Activity  ? Alcohol use: Not Currently  ?  Alcohol/week: 0.0 standard drinks  ? Drug use: Not Currently  ? Sexual activity: Yes  ?  Partners: Male  ?  Birth control/protection: Surgical, Condom  ?  Comment: tubal and ablation  ?Other Topics Concern  ? Not on file  ?Social History Narrative  ? Patient is right handed.  ? Patient drinks one cup of coffee daily.  ? She lives in a one-story home with her two children (74 year old son, 28 month old daughter).  ? Highest level of education: some college  ? 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 ?Social Determinants of Health  ? ?Financial Resource Strain: Medium Risk  ? Difficulty of Paying Living Expenses: Somewhat hard  ?Food Insecurity: Food Insecurity Present  ? Worried About Charity fundraiser in the Last Year: Sometimes true  ? Ran Out of Food in the Last Year: Sometimes true  ?Transportation Needs: No Transportation Needs  ? Lack of Transportation (Medical): No  ? Lack of Transportation (Non-Medical): No  ?Physical Activity: Inactive  ? Days of Exercise per Week: 0 days  ? Minutes of Exercise per Session: 10 min  ?Stress: Stress Concern Present  ? Feeling of Stress : Very much  ?Social Connections: Moderately Isolated  ? Frequency of Communication with Friends and Family: More than three times a week  ? Frequency of Social Gatherings with Friends and Family: Once a week  ? Attends Religious Services: 1 to 4 times per year  ? Active Member of Clubs or Organizations: No  ? Attends Archivist Meetings: Never  ? Marital Status: Divorced  ? ? ?Review of Systems ?Per HPI ? ?Objective:  ?BP 104/72   Pulse (!) 104   Temp 98.1 ?F (36.7 ?C)   Ht '5\' 5"'$  (1.651 m)   Wt 159 lb (72.1 kg)   SpO2 95%   BMI 26.46 kg/m?  ? ? ?  03/31/2022  ?  3:01 PM 03/08/2022  ?  3:00 AM 03/08/2022  ?  2:11 AM  ?BP/Weight  ?Systolic BP 195 97 093  ?Diastolic BP 72 60 78  ?Wt. (Lbs) 159    ?BMI 26.46 kg/m2    ? ? ?Physical Exam ?Vitals and  nursing note reviewed.  ?Constitutional:   ?   General: She is not in acute distress. ?HENT:  ?   Head: Normocephalic and atraumatic.  ?Cardiovascular:  ?   Rate and Rhythm: Regular rhythm. Tachycardia present.  ?Pulmonary:  ?   Effort: Pulmonary effort is normal.  ?   Breath sounds: Normal breath sounds. No wheezing, rhonchi or rales.  ?Skin: ?   Comments: Mild swelling noted on the dorsum of the hands bilaterally.  Purple discoloration noted of the hands and digits.  No appreciable abnormalities of the feet.  ?Neurological:  ?   Mental Status: She is alert.  ?Psychiatric:  ?   Comments: Flat affect.  Depressed mood.  ? ? ?Lab Results  ?Component Value Date  ?  WBC 9.1 08/31/2021  ? HGB 14.4 08/31/2021  ? HCT 43.1 08/31/2021  ? PLT 288 08/31/2021  ? GLUCOSE 65 (L) 08/31/2021  ? CHOL 95 03/10/2012  ? TRIG 50 03/10/2012  ? HDL 38 (L) 03/10/2012  ? LDLCALC 47 03/10/2012  ? ALT 28 07/28/2021  ? AST 25 07/28/2021  ? NA 137 08/31/2021  ? K 4.0 08/31/2021  ? CL 101 08/31/2021  ? CREATININE 0.61 08/31/2021  ? BUN 11 08/31/2021  ? CO2 30 08/31/2021  ? TSH 1.350 01/01/2021  ? INR 0.9 08/30/2019  ? HGBA1C 5.7 (H) 01/15/2015  ? ? ? ?Assessment & Plan:  ? ?Problem List Items Addressed This Visit   ? ?  ? Other  ? Anastomotic ulcer  ? Relevant Medications  ? lansoprazole (PREVACID) 30 MG capsule  ? Joint pain  ?  Patient endorsing hand pain and swelling and associated numbness and tingling.  Patient has multiple underlying comorbidities including neuropathy.  We discussed laboratory testing for evaluation of B12 levels.  I also offered referral to neurology.  Patient states that she will take these things into consideration and will discuss with her PCP at follow-up. ? ?  ?  ? RESOLVED: Constipation  ? Relevant Medications  ? linaclotide (LINZESS) 290 MCG CAPS capsule  ? ? ?Meds ordered this encounter  ?Medications  ? lansoprazole (PREVACID) 30 MG capsule  ?  Sig: Take 1 capsule (30 mg total) by mouth daily.  ?  Dispense:  180  capsule  ?  Refill:  1  ? linaclotide (LINZESS) 290 MCG CAPS capsule  ?  Sig: Take 1 capsule (290 mcg total) by mouth daily before breakfast.  ?  Dispense:  90 capsule  ?  Refill:  3  ? ? ?Thersa Salt DO ?[place]

## 2022-04-01 DIAGNOSIS — Z79899 Other long term (current) drug therapy: Secondary | ICD-10-CM | POA: Diagnosis not present

## 2022-04-01 DIAGNOSIS — M2550 Pain in unspecified joint: Secondary | ICD-10-CM | POA: Insufficient documentation

## 2022-04-01 NOTE — Assessment & Plan Note (Signed)
Patient endorsing hand pain and swelling and associated numbness and tingling.  Patient has multiple underlying comorbidities including neuropathy.  We discussed laboratory testing for evaluation of B12 levels.  I also offered referral to neurology.  Patient states that she will take these things into consideration and will discuss with her PCP at follow-up. ?

## 2022-04-02 ENCOUNTER — Encounter: Payer: Self-pay | Admitting: Family Medicine

## 2022-04-05 DIAGNOSIS — Z79899 Other long term (current) drug therapy: Secondary | ICD-10-CM | POA: Diagnosis not present

## 2022-04-05 NOTE — Telephone Encounter (Signed)
Nurses-please write out prescription for braces for wrist splints for nighttime use due to bilateral hand pain ? ?I will sign the prescription ?Then send/coordinate with the patient where to send to thank you ?

## 2022-04-08 DIAGNOSIS — Z79899 Other long term (current) drug therapy: Secondary | ICD-10-CM | POA: Diagnosis not present

## 2022-04-12 ENCOUNTER — Ambulatory Visit: Payer: Medicaid Other | Admitting: Neurology

## 2022-04-13 ENCOUNTER — Ambulatory Visit (INDEPENDENT_AMBULATORY_CARE_PROVIDER_SITE_OTHER): Payer: Medicaid Other | Admitting: Clinical

## 2022-04-13 DIAGNOSIS — F411 Generalized anxiety disorder: Secondary | ICD-10-CM | POA: Diagnosis not present

## 2022-04-13 DIAGNOSIS — Z79899 Other long term (current) drug therapy: Secondary | ICD-10-CM | POA: Diagnosis not present

## 2022-04-13 DIAGNOSIS — F332 Major depressive disorder, recurrent severe without psychotic features: Secondary | ICD-10-CM

## 2022-04-13 NOTE — Progress Notes (Signed)
Virtual Visit via Telephone Note ?  ?I connected with Penny [name] on 04/13/22 at  11:00 AM EDT by telephone and verified that I am speaking with the correct person using two identifiers. ?  ?Location: ?Patient: Home ?Provider: Office ?  ?I discussed the limitations, risks, security and privacy concerns of performing an evaluation and management service by telephone and the availability of in person appointments. I also discussed with the patient that there may be a patient responsible charge related to this service. The patient expressed understanding and agreed to proceed. ?  ?  ?THERAPIST PROGRESS NOTE ?  ?Session Time: 11:00 AM-11:55 AM ?  ?Participation Level: Active ?  ?Behavioral Response: CasualAlertDepressed and NA ?  ?Type of Therapy: Individual Therapy ?  ?Treatment Goals addressed: Coping ?  ?Interventions: CBT, Motivational Interviewing, Strength-based and Supportive ?  ?Summary: Penny [name] is a 36 y.o. female who presents with GAD and MDD. The OPT therapist worked with the patient for her ongoing OPT treatment session. The OPT therapist utilized Motivational Interviewing to assist in creating therapeutic repore. The patient in the session was engaged and work in collaboration giving feedback about her triggers and symptoms over the past few weeks. The patient spoke about adjusting to medication change. The OPT therapist worked with the patient on using her supports and communicating her feelings and focusing in areas that she has control in.The OPT therapist reviewed with the patient her upcoming health appointments. The patient spoke about her willingness to challenge negative thinking and not allow external factors she cannot control to escalate in its impact on her mental health. The OPT therapist overviewed and worked in session with the patient on family dynamics. ?  ?Suicidal/Homicidal: Nowithout intent/plan ?  ?Therapist Response: The OPT therapist worked with the patient for the patients  scheduled session. The patient was engaged in her session and gave feedback in relation to triggers, symptoms, and behavior responses over the past few weeks. The patient has been having difficulty managing symptoms of her GAD and MDD . The OPT therapist in session worked with the patient and utilized elements of CBT in working with the patient to help empower the patient to set healthy boundaries and put her effort into areas she has control to make decisions about. The OPT therapist worked with the patient on, positive thinking, communication of her feelings and not allowing automatic negative thoughts dominate, and use her coping strategies. The OPT therapist worked with the patient on family dynamics. The OPT therapist will continue to work with the patient for her ongoing OPT treatment. ?  ?Plan: Return again in 2/3 weeks. ?  ?Diagnosis:      Axis I: Depression and GAD ?  ?                        Axis II: No diagnosis ?  ?  ?  ?Collaboration of Care: No additional collaboration of care for this session. ?  ?Patient/Guardian was advised Release of Information must be obtained prior to any record release in order to collaborate their care with an outside provider. Patient/Guardian was advised if they have not already done so to contact the registration department to sign all necessary forms in order for [name] to release information regarding their care.  ?  ?Consent: Patient/Guardian gives verbal consent for treatment and assignment of benefits for services provided during this visit. Patient/Guardian expressed understanding and agreed to proceed.  ?  ?  ?I discussed the assessment  and treatment plan with the patient. The patient was provided an opportunity to ask questions and all were answered. The patient agreed with the plan and demonstrated an understanding of the instructions. ?  ?The patient was advised to call back or seek an in-person evaluation if the symptoms worsen or if the condition fails to improve as  anticipated. ?  ?I provided 55 minutes of non-face-to-face time during this encounter. ?  ?Lennox Grumbles, LCSW ? ?04/13/2022 ?  ?

## 2022-04-17 ENCOUNTER — Other Ambulatory Visit: Payer: Self-pay | Admitting: Family Medicine

## 2022-04-17 ENCOUNTER — Other Ambulatory Visit (HOSPITAL_COMMUNITY): Payer: Self-pay | Admitting: Psychiatry

## 2022-04-20 ENCOUNTER — Telehealth (HOSPITAL_COMMUNITY): Payer: Medicaid Other | Admitting: Psychiatry

## 2022-04-20 ENCOUNTER — Other Ambulatory Visit: Payer: Self-pay | Admitting: Nurse Practitioner

## 2022-04-20 ENCOUNTER — Other Ambulatory Visit: Payer: Self-pay | Admitting: Adult Health

## 2022-04-21 DIAGNOSIS — J4540 Moderate persistent asthma, uncomplicated: Secondary | ICD-10-CM | POA: Diagnosis not present

## 2022-04-21 DIAGNOSIS — J3089 Other allergic rhinitis: Secondary | ICD-10-CM | POA: Diagnosis not present

## 2022-04-21 DIAGNOSIS — D849 Immunodeficiency, unspecified: Secondary | ICD-10-CM | POA: Diagnosis not present

## 2022-04-21 DIAGNOSIS — J302 Other seasonal allergic rhinitis: Secondary | ICD-10-CM | POA: Diagnosis not present

## 2022-04-21 DIAGNOSIS — T783XXS Angioneurotic edema, sequela: Secondary | ICD-10-CM | POA: Diagnosis not present

## 2022-04-21 DIAGNOSIS — L209 Atopic dermatitis, unspecified: Secondary | ICD-10-CM | POA: Diagnosis not present

## 2022-04-22 ENCOUNTER — Telehealth: Payer: Self-pay | Admitting: *Deleted

## 2022-04-22 NOTE — Telephone Encounter (Signed)
Per Insurance : Levalbuterol Tar 23mg Inhaler approved 04/21/22-04/21/23  Pharmacy notified

## 2022-04-27 ENCOUNTER — Telehealth: Payer: Self-pay | Admitting: Family Medicine

## 2022-04-27 NOTE — Telephone Encounter (Signed)
Patient stated she has an appt with her pain management doctor tomorrow and will have him check her lungs. Patient declined earlier appt.

## 2022-04-27 NOTE — Telephone Encounter (Signed)
Pt states that she is still having chest congestion and headaches due to weather. Pt states she has had pneumonia for 3 month and is being referred to allergist. Pt not having any trouble breathing and no shortness of breath. Is using inhalers. Has appt Friday to discuss disability. Please advise. Thank you.  [employer]

## 2022-04-27 NOTE — Telephone Encounter (Signed)
So as for this sickness I will be more than happy to see her for this sooner.

## 2022-04-28 ENCOUNTER — Telehealth (INDEPENDENT_AMBULATORY_CARE_PROVIDER_SITE_OTHER): Payer: Medicaid Other | Admitting: Psychiatry

## 2022-04-28 ENCOUNTER — Encounter (HOSPITAL_COMMUNITY): Payer: Self-pay | Admitting: Psychiatry

## 2022-04-28 DIAGNOSIS — F900 Attention-deficit hyperactivity disorder, predominantly inattentive type: Secondary | ICD-10-CM

## 2022-04-28 DIAGNOSIS — F332 Major depressive disorder, recurrent severe without psychotic features: Secondary | ICD-10-CM

## 2022-04-28 DIAGNOSIS — F411 Generalized anxiety disorder: Secondary | ICD-10-CM

## 2022-04-28 MED ORDER — TRAZODONE HCL 100 MG PO TABS
200.0000 mg | ORAL_TABLET | Freq: Every day | ORAL | 2 refills | Status: DC
Start: 2022-04-28 — End: 2022-07-22

## 2022-04-28 MED ORDER — CLONAZEPAM 0.5 MG PO TABS
0.5000 mg | ORAL_TABLET | Freq: Two times a day (BID) | ORAL | 2 refills | Status: DC | PRN
Start: 2022-04-28 — End: 2022-06-15

## 2022-04-28 MED ORDER — DULOXETINE HCL 60 MG PO CPEP: 60.0000 mg | ORAL_CAPSULE | Freq: Two times a day (BID) | ORAL | 2 refills | Status: DC

## 2022-04-28 MED ORDER — AMPHETAMINE-DEXTROAMPHET ER 30 MG PO CP24: 30.0000 mg | ORAL_CAPSULE | ORAL | 0 refills | Status: DC

## 2022-04-28 MED ORDER — ADDERALL XR 30 MG PO CP24: ORAL_CAPSULE | ORAL | 0 refills | Status: DC

## 2022-04-28 NOTE — Progress Notes (Signed)
Virtual Visit via Telephone Note  I connected with Penny [name] on 04/28/22 at  2:00 PM EDT by telephone and verified that I am speaking with the correct person using two identifiers.  Location: Patient: home Provider: office   I discussed the limitations, risks, security and privacy concerns of performing an evaluation and management service by telephone and the availability of in person appointments. I also discussed with the patient that there may be a patient responsible charge related to this service. The patient expressed understanding and agreed to proceed.      I discussed the assessment and treatment plan with the patient. The patient was provided an opportunity to ask questions and all were answered. The patient agreed with the plan and demonstrated an understanding of the instructions.   The patient was advised to call back or seek an in-person evaluation if the symptoms worsen or if the condition fails to improve as anticipated.  I provided 15 minutes of non-face-to-face time during this encounter.   [name], MD  [facility] MD/PA/NP OP Progress Note  04/28/2022 2:15 PM Penny [name]  MRN:  [id number]  Chief Complaint:  Chief Complaint  Patient presents with   Depression   Anxiety   ADD   Follow-up   HPI: This patient is a 36 year old female who is now living alone in [place].  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  She has 2 children with whom she shares custody with their fathers  The patient returns for follow-up after 2 months.  She states that she has been struggling what she calls "pneumonia."  X-rays done here last month do not show any evidence of this but she states they were taken in pain management.  She states that she is slowly getting better.  She has been doing more with her children and seems to have a more positive outlook.  She is working hard with her therapist to try to be less negative.  Overall her mood is fairly good right now.   She denies thoughts of self-harm or suicidal ideation.  Her energy seems to be better and she is focusing well with the Adderall XR. Visit Diagnosis:    ICD-10-CM   1. Severe episode of recurrent major depressive disorder, without psychotic features ([place])  F33.2     2. GAD (generalized anxiety disorder)  F41.1     3. Attention deficit hyperactivity disorder (ADHD), predominantly inattentive type  F90.0       Past Psychiatric History: Long-term outpatient treatment, hospitalization last year for depression  Past Medical History:  Past Medical History:  Diagnosis Date   ADD (attention deficit disorder)    Anemia 2011   2o to GASTRIC BYPASS   Anginal pain ([place])    Anxiety    Asthma    Blood transfusion without reported diagnosis    Chronic daily headache    Depression    Depression    Dysmenorrhea 09/18/2013   Dysrhythmia    Elevated liver enzymes JUL 2011ALK PHOS 111-127 AST  143-267 ALT  213-321T BILI 0.6  ALB  3.7-4.06 Jun 2011 ALK PHOS 118 AST 24 ALT 42 T BILI 0.4 ALB 3.9   Encounter for drug rehabilitation    at behavioral health for opioid addiction about 4 years ago   Family history of adverse reaction to anesthesia    'dad had to be kept on pump for breathing for morphine'   Fatty liver    Fibroid 01/18/2017  Fibromyalgia    Gastric bypass status for obesity    Gastritis JULY 2011   Heart murmur    Hereditary and idiopathic peripheral neuropathy 01/15/2015   History of Holter monitoring    Hx of opioid abuse ([place])    for about 4 years, about 4 years ago   Interstitial cystitis    Iron deficiency anemia 07/23/2010   Irritable bowel syndrome 2012 DIARRHEA   JUN 2012 TTG IgA 14.9   IUD FEB 2010   Lupus ([place])    Menorrhagia 07/18/2013   Migraines    Obesity (BMI 30-39.9) 2011 228 LBS BMI 36.8   Osteoporosis    Ovarian cyst    Patient desires pregnancy 09/18/2013   Polyneuropathy    PONV (postoperative nausea and vomiting) seizure post-operatively   seizure  following ankle surgery   Potassium (K) deficiency    Pregnant 12/25/2013   Psychiatric pseudoseizure    RLQ abdominal pain 07/18/2013   Sciatica of left side 11/14/2014   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   Stress 09/18/2013    Past Surgical History:  Procedure Laterality Date   BIOPSY  04/10/2015   Procedure: BIOPSY;  Surgeon: Daneil Dolin, MD;  [facility]: [facility];  Service: Endoscopy;;   BIOPSY  04/15/2021   Procedure: BIOPSY;  Surgeon: Montez Morita, Quillian Quince, MD;  [facility];  Service: Gastroenterology;;   cath self every nite     for sodium bicarb injection (discontinued 2013)   CHOLECYSTECTOMY  2005   biliary dyskinesia   COLONOSCOPY  JUN 2012 ABD PN/DIARRHEA WITH PROPOFOL   NL COLON   COLONOSCOPY WITH PROPOFOL N/A 01/16/2021   Procedure: COLONOSCOPY WITH PROPOFOL;  Surgeon: Harvel Quale, MD;  [facility];  Service: Gastroenterology;  Laterality: N/A;  1:45   DILATION AND CURETTAGE OF UTERUS     DILITATION & CURRETTAGE/HYSTROSCOPY WITH NOVASURE ABLATION N/A 03/24/2017   Procedure: DILATATION & CURETTAGE/HYSTEROSCOPY WITH NOVASURE ENDOMETRIAL ABLATION;  Surgeon: Jonnie Kind, MD;  Location: [facility];  Service: Gynecology;  Laterality: N/A;   ESOPHAGEAL DILATION N/A 04/10/2015   Procedure: ESOPHAGEAL DILATION WITH 54FR MALONEY DILATOR;  Surgeon: Daneil Dolin, MD;  Location: [facility];  Service: Endoscopy;  Laterality: N/A;   ESOPHAGOGASTRODUODENOSCOPY     ESOPHAGOGASTRODUODENOSCOPY (EGD) WITH PROPOFOL N/A 04/10/2015   Procedure: ESOPHAGOGASTRODUODENOSCOPY (EGD) WITH PROPOFOL;  Surgeon: Daneil Dolin, MD;  [facility]: [facility];  Service: Endoscopy;  Laterality: N/A;   ESOPHAGOGASTRODUODENOSCOPY (EGD) WITH PROPOFOL N/A 12/06/2016   Procedure: ESOPHAGOGASTRODUODENOSCOPY (EGD) WITH PROPOFOL;  Surgeon: Danie Binder, MD;  [facility];  Service: Endoscopy;  Laterality: N/A;   ESOPHAGOGASTRODUODENOSCOPY (EGD) WITH PROPOFOL N/A 04/27/2019    Procedure: ESOPHAGOGASTRODUODENOSCOPY (EGD) WITH PROPOFOL;  Surgeon: Rogene Houston, MD;  [facility];  Service: Endoscopy;  Laterality: N/A;  730   ESOPHAGOGASTRODUODENOSCOPY (EGD) WITH PROPOFOL N/A 08/31/2019   Procedure: ESOPHAGOGASTRODUODENOSCOPY (EGD) WITH PROPOFOL;  Surgeon: Rogene Houston, MD;  [facility];  Service: Endoscopy;  Laterality: N/A;   ESOPHAGOGASTRODUODENOSCOPY (EGD) WITH PROPOFOL N/A 01/16/2021   Procedure: ESOPHAGOGASTRODUODENOSCOPY (EGD) WITH PROPOFOL;  Surgeon: Harvel Quale, MD;  [facility];  Service: Gastroenterology;  Laterality: N/A;   ESOPHAGOGASTRODUODENOSCOPY (EGD) WITH PROPOFOL N/A 04/15/2021   Procedure: ESOPHAGOGASTRODUODENOSCOPY (EGD) WITH PROPOFOL;  Surgeon: Harvel Quale, MD;  [facility];  Service: Gastroenterology;  Laterality: N/A;  9:00 AM   GAB  2007   in [place]-POUCH 5 CM   GASTRIC  BYPASS  06/2006   HYSTEROSCOPY WITH D & C N/A 09/12/2014   Procedure: DILATATION AND CURETTAGE /HYSTEROSCOPY;  Surgeon: Jonnie Kind, MD;  Location: [facility];  Service: Gynecology;  Laterality: N/A;   KYPHOPLASTY N/A 08/02/2018   Procedure: KYPHOPLASTY T12;  Surgeon: Melina Schools, MD;  Location: [facility];  Service: Orthopedics;  Laterality: N/A;  60 mins   LAPAROSCOPIC TUBAL LIGATION Bilateral 03/24/2017   Procedure: LAPAROSCOPIC TUBAL LIGATION ([place] Rings);  Surgeon: Jonnie Kind, MD;  Location: [facility];  Service: Gynecology;  Laterality: Bilateral;   REPAIR VAGINAL CUFF N/A 07/30/2014   Procedure: REPAIR VAGINAL CUFF;  Surgeon: Mora Bellman, MD;  Location: [facility];  Service: Gynecology;  Laterality: N/A;   SAVORY DILATION  06/20/2012   Dr. Barnie Alderman gastritis/Ulcer in the mid jejunum. Empiric dilation.    SURAL NERVE BX Left 02/25/2016   Procedure: LEFT SURAL NERVE BIOPSY;  Surgeon: Jovita Gamma, MD;  Location: [facility];  Service: Neurosurgery;  Laterality: Left;  Left sural nerve biopsy    TONSILLECTOMY     TONSILLECTOMY AND ADENOIDECTOMY     UPPER GASTROINTESTINAL ENDOSCOPY  JULY 2011 NAUSEA-D125,V6, PH 25   Bx; GASTRITIS, POUCH-5 CM LONG   WISDOM TOOTH EXTRACTION      Family Psychiatric History: see below  Family History:  Family History  Problem Relation Age of Onset   Coronary artery disease Paternal Grandfather    Migraines Paternal Grandmother    Breast cancer Paternal Grandmother    Hemochromatosis Maternal Grandmother    Migraines Maternal Grandmother    Cancer Maternal Grandmother    Breast cancer Maternal Grandmother    Hypertension Father    Diabetes Father    Coronary artery disease Father    Cancer Mother        breast   Hemochromatosis Mother    Breast cancer Mother    Depression Mother    Anxiety disorder Mother    Anxiety disorder Brother    Bipolar disorder Brother    Healthy Daughter    Healthy Son     Social History:  Social History   Socioeconomic History   Marital status: Divorced    Spouse name: Not on file   Number of children: 2   Years of education: some college   Highest education level: Some college, no degree  Occupational History   Occupation: Applying for Social Security Disability    Employer: NOT EMPLOYED  Tobacco Use   Smoking status: Former    Packs/day: 0.25    Years: 6.00    Pack years: 1.50    Types: Cigarettes    Start date: 05/2018   Smokeless tobacco: Never  Vaping Use   Vaping Use: Every day  Substance and Sexual Activity   Alcohol use: Not Currently    Alcohol/week: 0.0 standard drinks   Drug use: Not Currently   Sexual activity: Yes    Partners: Male    Birth control/protection: Surgical, Condom    Comment: tubal and ablation  Other Topics Concern   Not on file  Social History Narrative   Patient is right handed.   Patient drinks one cup of coffee daily.   She lives in a one-story home with her two children (86 year old son, 46 month old daughter).   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   Difficulty of Paying  Living Expenses: Somewhat hard  Food Insecurity: Food Insecurity Present   Worried About Charity fundraiser in the Last Year: Sometimes true   Ran Out of Food in the Last Year: Sometimes true  Transportation Needs: No Transportation Needs   Lack of Transportation (Medical): No   Lack of Transportation (Non-Medical): No  Physical Activity: Inactive   Days of Exercise per Week: 0 days   Minutes of Exercise per Session: 10 min  Stress: Stress Concern Present   Feeling of Stress : Very much  Social Connections: Moderately Isolated   Frequency of Communication with Friends and Family: More than three times a week   Frequency of Social Gatherings with Friends and Family: Once a week   Attends Religious Services: 1 to 4 times per year   Active Member of [employer] or Organizations: No   Attends Archivist Meetings: Never   Marital Status: Divorced    Allergies:  Allergies  Allergen Reactions   Gabapentin Other (See Comments)    Pt states that she was unresponsive after taking this medication, but her vitals remained stable.     Metoclopramide Hcl Anxiety and Other (See Comments)    Pt states that she felt like she was trapped in a box, and could not get out.  Pt also states that she had temporary loss of movement, weakness, and tingling.     Tramadol Other (See Comments)    Seizures   Ativan [Lorazepam] Other (See Comments)    combative   Latex Itching, Rash and Other (See Comments)    Burns   Lyrica [Pregabalin] Other (See Comments)    suicidal   Iron Other (See Comments)    PATIENT IS INTOLERANT TO ORAL IRON , SHE DOES NOT ABSORB IT.     Sulfa Antibiotics Hives   Butrans [Buprenorphine] Rash    Patch caused rash, didn't help with pain   Nucynta [Tapentadol] Nausea And Vomiting   Propofol Nausea And Vomiting   Tape  Hives, Itching and Rash    Please use paper tape   Toradol [Ketorolac Tromethamine] Anxiety    Metabolic Disorder Labs: Lab Results  Component Value Date   HGBA1C 5.7 (H) 01/15/2015   Lab Results  Component Value Date   PROLACTIN 80.0 (H) 05/09/2015   Lab Results  Component Value Date   CHOL 95 03/10/2012   TRIG 50 03/10/2012   HDL 38 (L) 03/10/2012   CHOLHDL 2.5 03/10/2012   VLDL 10 03/10/2012   LDLCALC 47 03/10/2012   Lab Results  Component Value Date   TSH 1.350 01/01/2021   TSH 2.616 08/25/2019    Therapeutic Level Labs: No results found for: LITHIUM Lab Results  Component Value Date   VALPROATE 66 06/23/2020   No components found for:  CBMZ  Current Medications: Current Outpatient Medications  Medication Sig Dispense Refill   ADDERALL XR 30 MG 24 hr capsule TAKE 1 CAPSULE BY MOUTH ONCE EVERY MORNING. 30 capsule 0   amphetamine-dextroamphetamine (ADDERALL XR) 30 MG 24 hr capsule Take 1 capsule (30 mg total) by mouth every morning. 30 capsule 0   Calcium Carb-Cholecalciferol (CALCIUM-VITAMIN D3) 600-500 MG-UNIT CAPS Take 1 tablet by mouth daily. (Patient not taking: Reported on 03/31/2022)     cetirizine (ZYRTEC) 10 MG tablet TAKE ONE TABLET BY MOUTH DAILY 30 tablet 0   clidinium-chlordiazePOXIDE (LIBRAX) 5-2.5 MG capsule TAKE (1) CAPSULE BY MOUTH 3 TIMES DAILY BEFORE MEALS. 90 capsule 0   clonazePAM (KLONOPIN) 0.5 MG tablet Take 1  tablet (0.5 mg total) by mouth 2 (two) times daily as needed. 60 tablet 2   DULoxetine (CYMBALTA) 60 MG capsule Take 1 capsule (60 mg total) by mouth 2 (two) times daily. 180 capsule 2   Erenumab-aooe (AIMOVIG) 140 MG/ML SOAJ Inject 140 mg into the skin every 28 (twenty-eight) days. 1 mL 11   fluconazole (DIFLUCAN) 150 MG tablet Take 1 tablet (150 mg total) by mouth once a week. 2 tablet 0   fluticasone (FLOVENT HFA) 220 MCG/ACT inhaler Inhale 2 puffs into the lungs 2 (two) times daily. Do not inhale, swallow powder and drink water , do  not eat food for 20 minutes after puffs 4 each 2   lansoprazole (PREVACID) 30 MG capsule Take 1 capsule (30 mg total) by mouth daily. 180 capsule 1   levalbuterol (XOPENEX HFA) 45 MCG/ACT inhaler INHALE (2) PUFFS INTO THE LUNGS EVERY SIX HOURS AS NEEDED FOR WHEEZING. 15 g 3   levalbuterol (XOPENEX) 1.25 MG/3ML nebulizer solution Take 1.25 mg by nebulization every 4 (four) hours as needed for wheezing. 50 mL 3   linaclotide (LINZESS) 290 MCG CAPS capsule Take 1 capsule (290 mcg total) by mouth daily before breakfast. 90 capsule 3   meclizine (ANTIVERT) 25 MG tablet Take 1 tablet (25 mg total) by mouth 3 (three) times daily as needed for dizziness. 30 tablet 0   ondansetron (ZOFRAN) 8 MG tablet TAKE 1 TABLET EVERY 8 HOURS AS NEEDED FOR NAUSEA 12 tablet 4   Pediatric Multivit-Minerals-C (FLINTSTONES GUMMIES) chewable tablet Chew 3 tablets by mouth daily.     promethazine (PHENERGAN) 12.5 MG tablet TAKE 1 TABLET BY MOUTH EVERY 8 HOURS AS NEEDED FOR NAUSEA OR VOMITING 30 tablet 0   promethazine (PHENERGAN) 25 MG tablet TAKE (1) TABLET BY MOUTH TWICE A DAY AS NEEDED. 30 tablet 2   Rimegepant Sulfate (NURTEC) 75 MG TBDP Take 1 tablet as needed for severe migraine.  Limit to twice per week. 10 tablet 3   tiZANidine (ZANAFLEX) 4 MG tablet Take 4 mg by mouth 3 (three) times daily as needed. (Patient not taking: Reported on 03/31/2022)     traZODone (DESYREL) 100 MG tablet Take 2 tablets (200 mg total) by mouth at bedtime. 60 tablet 2   valACYclovir (VALTREX) 1000 MG tablet TAKE 1 TABLET BY MOUTH DAILY. 30 tablet PRN   Vitamin D, Ergocalciferol, (DRISDOL) 1.25 MG (50000 UNIT) CAPS capsule Take 50,000 Units by mouth every Wednesday.     No current facility-administered medications for this visit.     Musculoskeletal: Strength & Muscle Tone: na Gait & Station: na Patient leans: N/A  Psychiatric Specialty Exam: Review of Systems  HENT:  Positive for congestion.   Respiratory:  Positive for chest  tightness.   All other systems reviewed and are negative.  There were no vitals taken for this visit.There is no height or weight on file to calculate BMI.  General Appearance: NA  Eye Contact:  NA  Speech:  Clear and Coherent  Volume:  Normal  Mood:  Euthymic  Affect:  NA  Thought Process:  Goal Directed  Orientation:  Full (Time, Place, and Person)  Thought Content: WDL   Suicidal Thoughts:  No  Homicidal Thoughts:  No  Memory:  Immediate;   Good Recent;   Good Remote;   Good  Judgement:  Good  Insight:  Fair  Psychomotor Activity:  Decreased  Concentration:  Concentration: Good and Attention Span: Good  Recall:  Good  Fund of Knowledge: Good  Language: Good  Akathisia:  No  Handed:  Right  AIMS (if indicated): not done  Assets:  Communication Skills Desire for Improvement Resilience Social Support Talents/Skills  ADL's:  Intact  Cognition: WNL  Sleep:  Good   Screenings: AIMS    Flowsheet Row Admission (Discharged) from 06/19/2020 in [facility]  AIMS Total Score 0      AUDIT    Flowsheet Row Patient Outreach Telephone from 07/14/2020 in [facility] Coordination Admission (Discharged) from 06/19/2020 in [facility] ED to Hosp-Admission (Discharged) from 05/09/2015 in [facility]  Alcohol Use Disorder Identification Test Final Score (AUDIT) 1 1 0      GAD-7    Flowsheet Row Office Visit from 08/25/2021 in [facility] Counselor from 12/11/2018 in [facility] from 02/07/2017 in [facility]  Total GAD-7 Score _0 PHQ2-9    Flowsheet Row Video Visit from 04/28/2022 in [facility] Video Visit from 02/18/2022 in [facility] from 12/17/2021 in  [facility] Video Visit from 12/09/2021 in [facility] from 11/12/2021 in [facility]  PHQ-2 Total Score _1 0 4  PHQ-9 Total Score -- 8 20 -- 20      Flowsheet Row Video Visit from 04/28/2022 in [facility] ED from 03/08/2022 in [facility] Video Visit from 02/18/2022 in [facility] No Risk No Risk No Risk        Assessment and Plan: This patient is a 37 year old female with a history of anxiety depression ADHD pseudoseizures borderline personality disorder and insomnia.  She continues to improve and her mood.  She will continue Cymbalta 120 mg daily for depression, Adderall XR 30 mg every morning for ADD, clonazepam 0.5 mg twice daily as needed for anxiety and trazodone 200 mg at bedtime for sleep.  She will return to see me in 2 months  Collaboration of Care: Collaboration of Care: Referral or follow-up with counselor/therapist AEB patient will follow-up with therapist Maye Hides in our office  Patient/Guardian was advised Release of Information must be obtained prior to any record release in order to collaborate their care with an outside provider. Patient/Guardian was advised if they have not already done so to contact the registration department to sign all necessary forms in order for [name] to release information regarding their care.   Consent: Patient/Guardian gives verbal consent for treatment and assignment of benefits for services provided during this visit. Patient/Guardian expressed understanding and agreed to proceed.    [name], MD 04/28/2022, 2:15 PM

## 2022-04-30 ENCOUNTER — Encounter: Payer: Self-pay | Admitting: Family Medicine

## 2022-04-30 ENCOUNTER — Ambulatory Visit: Payer: Medicaid Other | Admitting: Family Medicine

## 2022-04-30 VITALS — BP 98/62 | HR 112 | Temp 97.5°F | Ht 65.0 in | Wt 158.0 lb

## 2022-04-30 DIAGNOSIS — F445 Conversion disorder with seizures or convulsions: Secondary | ICD-10-CM | POA: Diagnosis not present

## 2022-04-30 DIAGNOSIS — Z79891 Long term (current) use of opiate analgesic: Secondary | ICD-10-CM | POA: Diagnosis not present

## 2022-04-30 DIAGNOSIS — G894 Chronic pain syndrome: Secondary | ICD-10-CM

## 2022-04-30 DIAGNOSIS — F900 Attention-deficit hyperactivity disorder, predominantly inattentive type: Secondary | ICD-10-CM | POA: Diagnosis not present

## 2022-04-30 DIAGNOSIS — G629 Polyneuropathy, unspecified: Secondary | ICD-10-CM

## 2022-04-30 DIAGNOSIS — F410 Panic disorder [episodic paroxysmal anxiety] without agoraphobia: Secondary | ICD-10-CM | POA: Diagnosis not present

## 2022-04-30 DIAGNOSIS — F418 Other specified anxiety disorders: Secondary | ICD-10-CM

## 2022-04-30 NOTE — Progress Notes (Signed)
Subjective:    Patient ID: Penny [name], female    DOB: 28-Sep-1986, 36 y.o.   MRN: [id number]  HPI Patient would like to discuss getting disability  This patient has a longstanding history of psychiatric illness She suffers with severe anxiety and depression Has frequent panic attacks Has been seen psychiatrist and counselor for years Is on multiple medicines Still having a hard time controlling the symptoms  In addition to this patient has suffered with seizure-like disorder that had significant evaluation which did not show seizures but was felt to be potentially pseudoseizures.  As a result of all of this she also has intermittent but relatively frequent spells of disassociation where she literally becomes unaware of her surroundings cannot talk appropriately cannot understand what is going on around her and this can literally last for 20 to 30 minutes at a time and if these occur without someone around her who knows what is going on can actually be very frightening to her as others as well.  Patient also relates that she gets extremely nervous and anxious when she is around strangers as well as in unfamiliar places plus also when she leaves her home.  It is a struggle to get out.  She is unable to operate on her own much at all.  She is on significant amount of psychiatric medications as well as benzodiazepine  She is also has a history of gastric bypass surgery with weight loss yet having significant gastric issues associated with that  Patient also has frequent migraines 3 or 4/month despite utilizing medicines she has seen specialist for this  Patient has also had neuropathic pain in the legs and has seen neurology for this and has ongoing pain and discomfort  Patient is seen gastroenterology multiple times for chronic abdominal pain  She is under the care of a pain medicine doctor for neuropathy related pain of the legs as well as chronic abdominal pain and is on substantial  amount of opioids.  Review of Systems     Objective:   Physical Exam Lungs clear respiratory rate normal heart regular patient able to relate today blood pressure on the low side extremities no edema weight 158       Assessment & Plan:  1. Psychogenic nonepileptic seizure Patient still unexplained spells that occur inability to speak appropriate or represent self and finds himself forgetting and being unaware of what is going on.  The spells are relatively frequent  2. Neuropathy She has significant neuropathy has been seen by neurology She is on chronic pain medicines and this helps take the edge off the pain but still has significant troubles  3. Depression with anxiety Followed by psychiatry and counselor on multiple medications  4. 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 See per above Has frequent panic when she leaves the house or when she is in unfamiliar situations or around people she is not aware of  5. Attention deficit hyperactivity disorder, predominantly inattentive type Takes ADD medicine on a regular basis per psychiatry  6. Chronic pain syndrome Takes significant amount of narcotic medicine for chronic pain to take the edge off her pain  7. Chronic prescription opiate use See per above  This patient is well-known to our practice.  She has been with [name] for many years.  She is going through multiple health issues throughout this time including neurologic issues, psychiatric issues, pseudoseizures, chronic pain, neuropathy, disassociative spells  I believe that she has adequately sought care with various  specialists and has tried her best to be consistent with medication and following direction. Despite all this she remains disabled with these multiple issues I do not feel that this patient would be able to hold down a regular job.  The amount of pain and discomfort she has would necessitate the need for her to change positions frequently  beyond what normal job would accommodate Also the amount of spells that she has along with her panic attacks and fear of leaving the house increases the likelihood greatly that she would miss multiple days of work per week and not be able to maintain a standard work environment In my opinion she is disabled from a mental health standpoint and would benefit seeking this through her specialist as well as the [name] government/Social Security  Follow-up as needed

## 2022-05-06 ENCOUNTER — Ambulatory Visit (HOSPITAL_COMMUNITY): Payer: Medicaid Other | Admitting: Clinical

## 2022-05-07 DIAGNOSIS — Z79899 Other long term (current) drug therapy: Secondary | ICD-10-CM | POA: Diagnosis not present

## 2022-05-11 DIAGNOSIS — Z79899 Other long term (current) drug therapy: Secondary | ICD-10-CM | POA: Diagnosis not present

## 2022-05-12 DIAGNOSIS — L209 Atopic dermatitis, unspecified: Secondary | ICD-10-CM | POA: Diagnosis not present

## 2022-05-12 DIAGNOSIS — J3089 Other allergic rhinitis: Secondary | ICD-10-CM | POA: Diagnosis not present

## 2022-05-12 DIAGNOSIS — H6091 Unspecified otitis externa, right ear: Secondary | ICD-10-CM | POA: Diagnosis not present

## 2022-05-12 DIAGNOSIS — J302 Other seasonal allergic rhinitis: Secondary | ICD-10-CM | POA: Diagnosis not present

## 2022-05-13 ENCOUNTER — Ambulatory Visit (HOSPITAL_COMMUNITY): Payer: Medicaid Other | Admitting: Clinical

## 2022-05-13 ENCOUNTER — Telehealth (HOSPITAL_COMMUNITY): Payer: Self-pay | Admitting: Clinical

## 2022-05-13 NOTE — Telephone Encounter (Signed)
The patient did not respond to contact attempts VM was left but no response.

## 2022-05-25 ENCOUNTER — Telehealth: Payer: Self-pay | Admitting: *Deleted

## 2022-05-25 NOTE — Telephone Encounter (Signed)
Per Dr Nicki Reaper: Patient needs virtual visit scheduled this week or next week for documentation for urinary incontinence supplies(form at nurses station)  Left message to return call

## 2022-06-03 DIAGNOSIS — Z79899 Other long term (current) drug therapy: Secondary | ICD-10-CM | POA: Diagnosis not present

## 2022-06-07 DIAGNOSIS — Z79899 Other long term (current) drug therapy: Secondary | ICD-10-CM | POA: Diagnosis not present

## 2022-06-10 NOTE — Telephone Encounter (Signed)
Please contact patient to have her set up appt for supplies. Thank you!

## 2022-06-11 NOTE — Telephone Encounter (Signed)
Appt made Monday at 3:30 Dr Nicki Reaper

## 2022-06-14 ENCOUNTER — Telehealth: Payer: Self-pay | Admitting: *Deleted

## 2022-06-14 ENCOUNTER — Ambulatory Visit (INDEPENDENT_AMBULATORY_CARE_PROVIDER_SITE_OTHER): Payer: Medicaid Other | Admitting: Family Medicine

## 2022-06-14 DIAGNOSIS — N39498 Other specified urinary incontinence: Secondary | ICD-10-CM | POA: Diagnosis not present

## 2022-06-14 DIAGNOSIS — R32 Unspecified urinary incontinence: Secondary | ICD-10-CM | POA: Insufficient documentation

## 2022-06-14 DIAGNOSIS — R112 Nausea with vomiting, unspecified: Secondary | ICD-10-CM | POA: Diagnosis not present

## 2022-06-14 IMAGING — DX DG LUMBAR SPINE COMPLETE 4+V
5 series · 5 of 5 positions shown · non-contrast
Comparison: 06/02/2018, 01/03/2020

CLINICAL DATA: Syncope, fell, lower back pain

EXAM:
LUMBAR SPINE - COMPLETE 4+ VIEW

[l-spine ap]
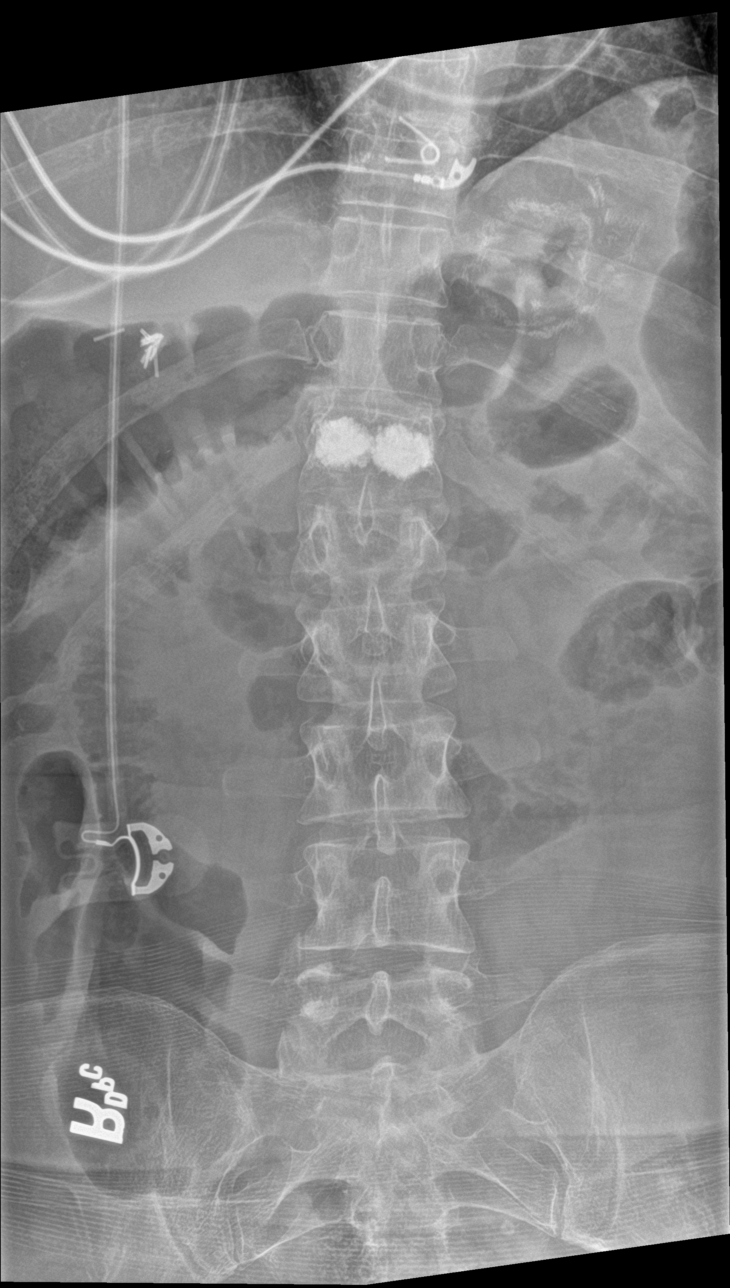

[l-spine obl (1 of 2)]
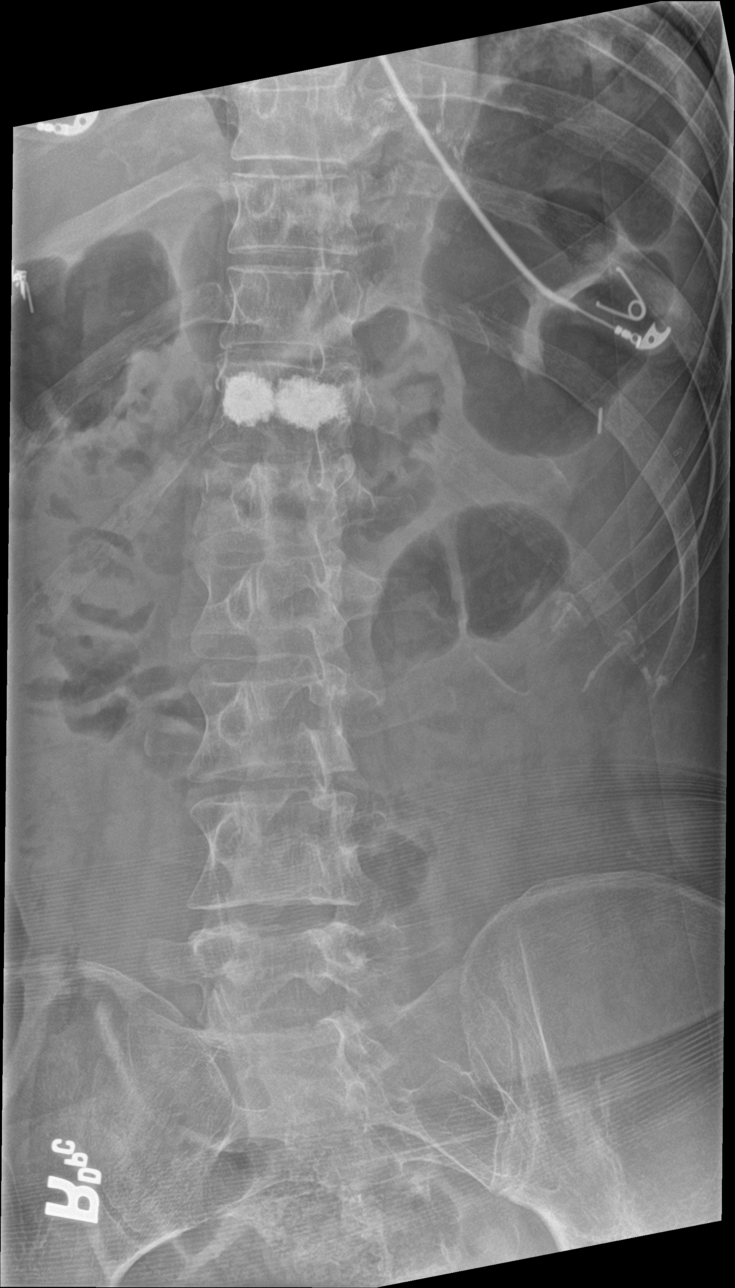

[l-spine obl (2 of 2)]
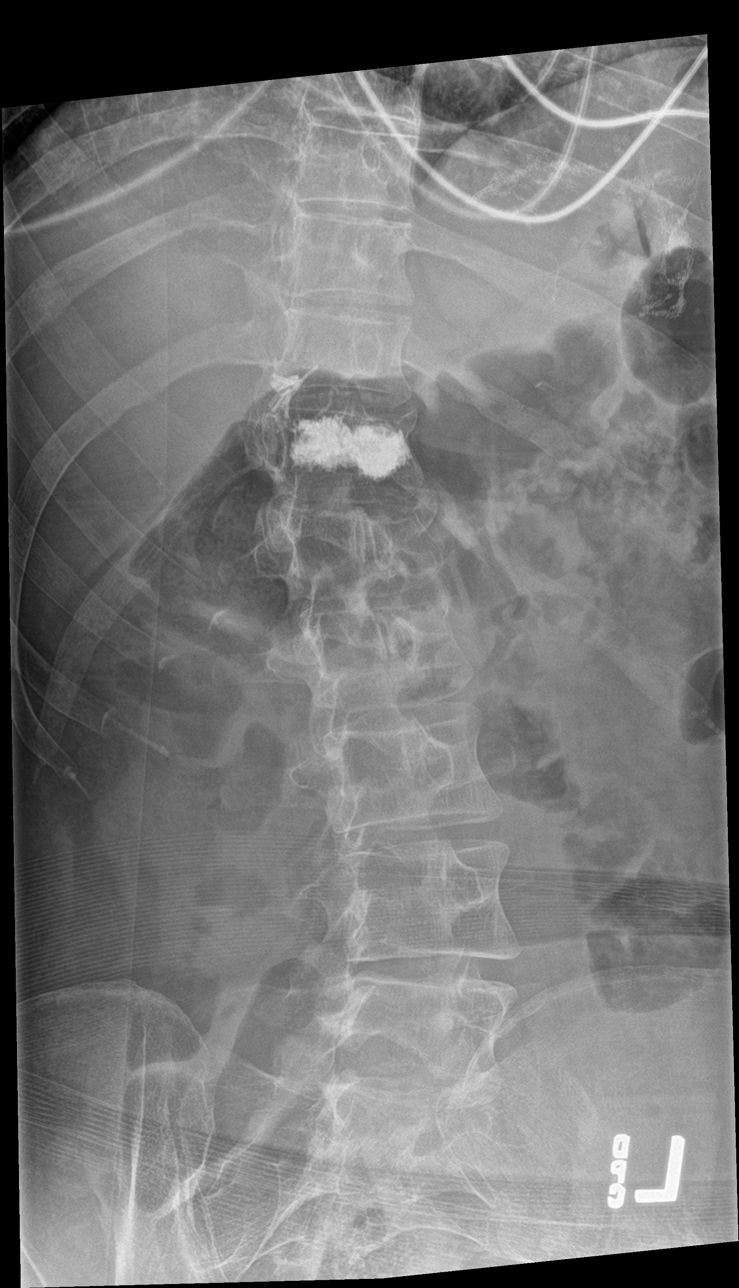

[l-spine lat]
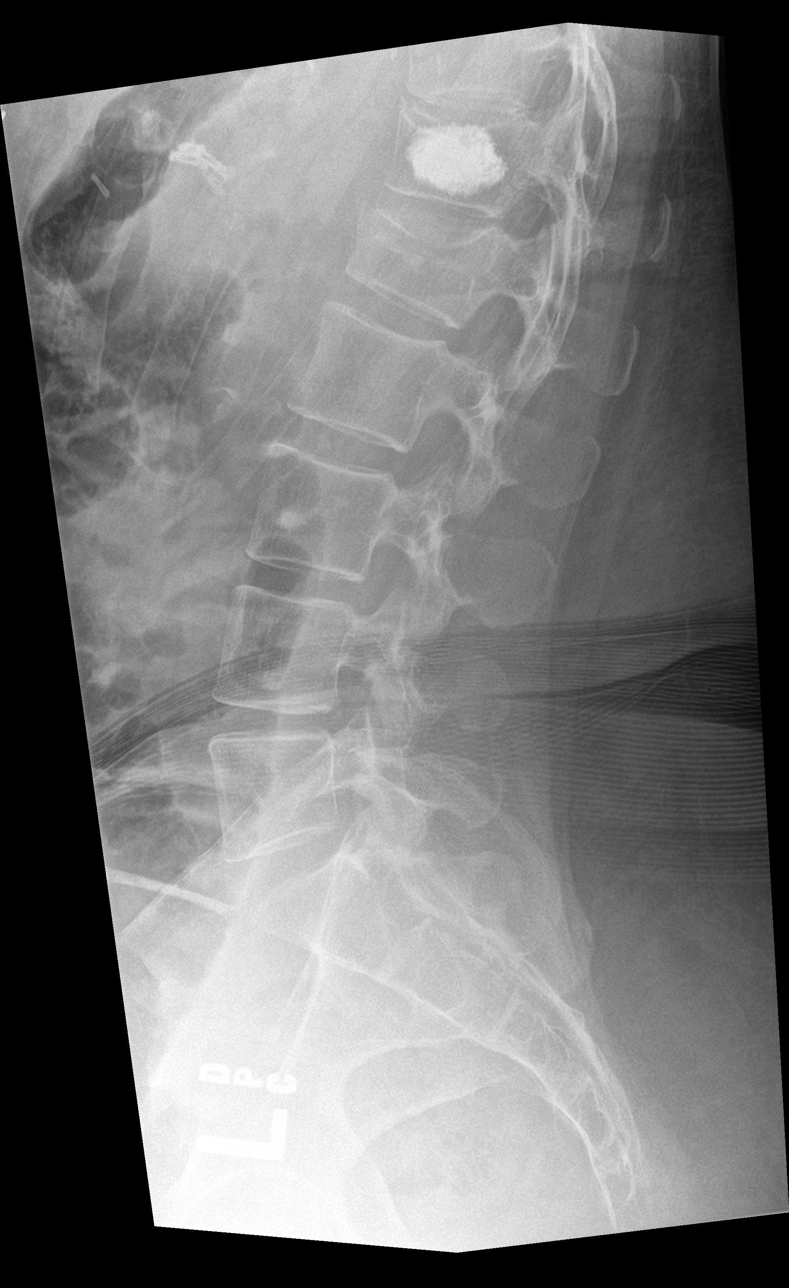

[l-spine spot]
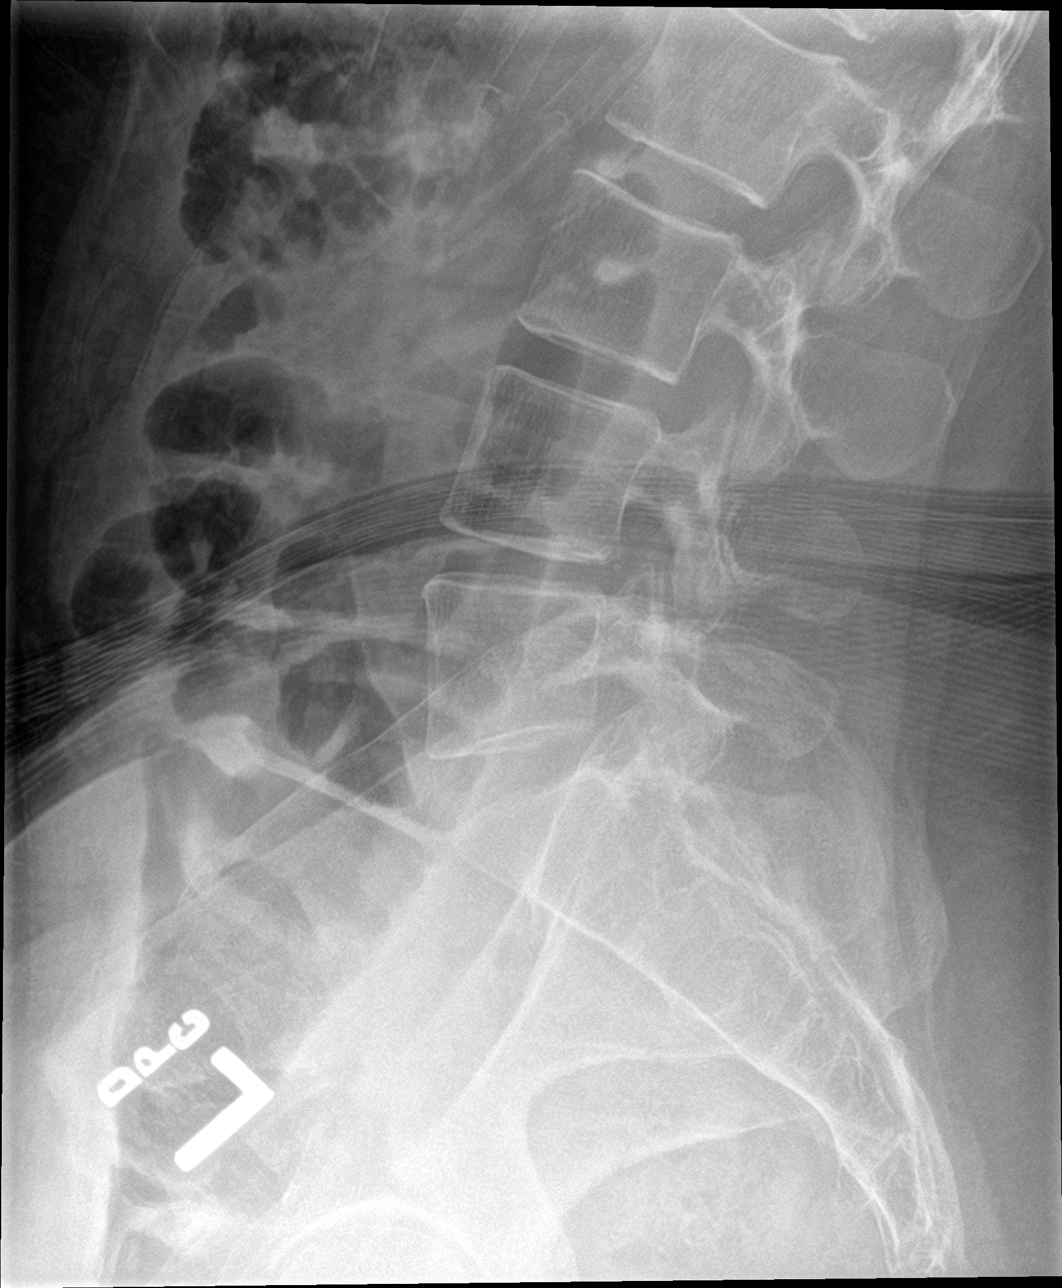

[5 of 5 positions shown; findings below may reference images not displayed]

FINDINGS: Frontal, bilateral oblique, lateral views of the lumbar spine are
obtained. There are 5 non-rib-bearing lumbar type vertebral bodies.
There is a new compression deformity involving the superior endplate
of the L1 vertebral body, with approximately 25% loss of vertebral
body height. No retropulsion.

Chronic T12 compression deformity with post procedural changes from
vertebral augmentation. Remaining lumbar vertebral bodies are
unremarkable. Disc spaces are well preserved. Sacroiliac joints are
normal.
IMPRESSION: 1. Likely acute compression fracture involving the superior endplate
of the L1 vertebral body, with approximately 25% loss of vertebral
body height.
2. Chronic T12 compression deformity and vertebral augmentation.

## 2022-06-14 MED ORDER — CEFDINIR 300 MG PO CAPS: 300.0000 mg | ORAL_CAPSULE | Freq: Two times a day (BID) | ORAL | 0 refills | Status: DC

## 2022-06-14 MED ORDER — FLUCONAZOLE 150 MG PO TABS: 150.0000 mg | ORAL_TABLET | ORAL | 0 refills | Status: DC

## 2022-06-14 NOTE — Telephone Encounter (Signed)
[name], sawchuk are scheduled for a virtual visit with your provider today.    Just as we do with appointments in the office, we must obtain your consent to participate.  Your consent will be active for this visit and any virtual visit you may have with one of our providers in the next 365 days.    If you have a MyChart account, I can also send a copy of this consent to you electronically.  All virtual visits are billed to your insurance company just like a traditional visit in the office.  As this is a virtual visit, video technology does not allow for your provider to perform a traditional examination.  This may limit your provider's ability to fully assess your condition.  If your provider identifies any concerns that need to be evaluated in person or the need to arrange testing such as labs, EKG, etc, we will make arrangements to do so.    Although advances in technology are sophisticated, we cannot ensure that it will always work on either your end or our end.  If the connection with a video visit is poor, we may have to switch to a telephone visit.  With either a video or telephone visit, we are not always able to ensure that we have a secure connection.   I need to obtain your verbal consent now.   Are you willing to proceed with your visit today?   Penny [name] has provided verbal consent on 06/14/2022 for a virtual visit (video or telephone).   Ned Card, LPN 8/46/6599  3:57 PM

## 2022-06-14 NOTE — Progress Notes (Signed)
   Subjective:    Patient ID: Penny [name], female    DOB: July 27, 1986, 36 y.o.   MRN: [id number]  HPI  Patient wants to discuss incontinent supplies Patient has longstanding incontinence Related to neurologic issues Has tried various measures without success Often has incontinence at nighttime when she is sleeping States she goes through about 2 pull-ups per night as well as 1 or 2 chucks that she uses on her bed and she goes through multiple wipes per day she is hoping that her Medicaid would help provide the supplies to her because she is having a difficult time affording these Virtual Visit via Telephone Note  I connected with Penny [name] on 06/14/22 at  3:30 PM EDT by telephone and verified that I am speaking with the correct person using two identifiers.  Location: Patient: home Provider: office   I discussed the limitations, risks, security and privacy concerns of performing an evaluation and management service by telephone and the availability of in person appointments. I also discussed with the patient that there may be a patient responsible charge related to this service. The patient expressed understanding and agreed to proceed.   History of Present Illness:    Observations/Objective:   Assessment and Plan:   Follow Up Instructions:    I discussed the assessment and treatment plan with the patient. The patient was provided an opportunity to ask questions and all were answered. The patient agreed with the plan and demonstrated an understanding of the instructions.   The patient was advised to call back or seek an in-person evaluation if the symptoms worsen or if the condition fails to improve as anticipated.  I provided  15 minutes of non-face-to-face time during this encounter.       Review of Systems     Objective:   Physical Exam Today's visit was via telephone Physical exam was not possible for this visit        Assessment & Plan:  1. Other  urinary incontinence Patient has incontinence issues worse at nighttime.  Related to neurogenic bladder as well as possibly medication interaction causing this she is previously seen neurology and neurology  She would benefit from having supplies such as wipes, pull-ups, chucks  2. Nausea and vomiting, unspecified vomiting type Intermittent nausea and vomiting she states when this occurs she typically uses Ensure once or twice a day which helps with her nutrition  We will try to get her supplies approved by home health supplier such as [facility]

## 2022-06-15 ENCOUNTER — Other Ambulatory Visit (INDEPENDENT_AMBULATORY_CARE_PROVIDER_SITE_OTHER): Payer: Self-pay | Admitting: Gastroenterology

## 2022-06-15 DIAGNOSIS — K589 Irritable bowel syndrome without diarrhea: Secondary | ICD-10-CM

## 2022-06-15 NOTE — Telephone Encounter (Signed)
Last seen 03/26/21

## 2022-06-15 NOTE — Progress Notes (Signed)
Script written out and placed on provider door for signature.  

## 2022-06-15 NOTE — Telephone Encounter (Signed)
Will refill medication for 3 months, needs follow up appointment with any provider in order to receive any refills. ? ?Thanks, ? ?Jany Buckwalter Castaneda, MD ?Gastroenterology and Hepatology ?[facility] ? ?

## 2022-06-16 NOTE — Telephone Encounter (Signed)
Penny [name], Please schedule appt within next 3 months with any provider. thanks

## 2022-06-18 DIAGNOSIS — R32 Unspecified urinary incontinence: Secondary | ICD-10-CM | POA: Diagnosis not present

## 2022-06-18 DIAGNOSIS — N319 Neuromuscular dysfunction of bladder, unspecified: Secondary | ICD-10-CM | POA: Diagnosis not present

## 2022-06-20 ENCOUNTER — Telehealth: Payer: Self-pay | Admitting: Family Medicine

## 2022-06-20 NOTE — Telephone Encounter (Signed)
Nurses-patient had recent phone visit.  She indicated that she utilizes 1 or 2 ensures per day.  She wondered if her insurance would help pay for this.  I am not aware of how that would work.  Perhaps you can connect with social worker to see if that is covered if so you can write a prescription I will sign it in I imagine it will be sent to [facility].

## 2022-06-21 NOTE — Telephone Encounter (Signed)
Will call [facility] to follow up

## 2022-06-24 NOTE — Telephone Encounter (Signed)
Thank you for finding this out-please connect with patient to relay this to her thank you

## 2022-06-24 NOTE — Telephone Encounter (Signed)
Per [employer]:  Insurance would not [employer] unless it is being administered as tube feeding  They said her only option would be to go to [facility] on [address] in [place] and she could apply for the CAP program and they will help some with Ensure if she is approved for the CAP program

## 2022-06-25 NOTE — Telephone Encounter (Signed)
Patient notified and verbalized understanding. 

## 2022-06-28 ENCOUNTER — Telehealth: Payer: Self-pay | Admitting: Family Medicine

## 2022-06-28 NOTE — Telephone Encounter (Signed)
Last 2 nights pt has been coughing really bad, vomiting and acid reflux. Gets stuck right at top of diaphragm. When trying to keep food down, pt states it will come back up. Pt is wanting to know of Dr.Scott can order an ultrasound. Please advise. Thank you.  (If no answer, may leave detailed voice message if not needing to ask more questions.)

## 2022-06-28 NOTE — Telephone Encounter (Signed)
Patient having issues digesting food.She is wanting to know what she can do

## 2022-06-28 NOTE — Telephone Encounter (Signed)
Pt contacted and verbalized understanding. Pt placed on schedule for tomorrow 06/29/22.

## 2022-06-28 NOTE — Telephone Encounter (Signed)
In order to properly assess this issue we would need to see her and evaluate her then we can order any appropriate tests including ultrasound (May have a open slot appointment with myself anywhere this week)

## 2022-06-29 ENCOUNTER — Ambulatory Visit: Payer: Medicaid Other | Admitting: Family Medicine

## 2022-06-29 ENCOUNTER — Encounter: Payer: Self-pay | Admitting: Family Medicine

## 2022-06-29 VITALS — BP 100/64 | HR 96 | Temp 98.6°F | Ht 65.0 in | Wt 164.0 lb

## 2022-06-29 DIAGNOSIS — R059 Cough, unspecified: Secondary | ICD-10-CM

## 2022-06-29 DIAGNOSIS — R1013 Epigastric pain: Secondary | ICD-10-CM

## 2022-06-29 DIAGNOSIS — R1310 Dysphagia, unspecified: Secondary | ICD-10-CM

## 2022-06-29 DIAGNOSIS — K2100 Gastro-esophageal reflux disease with esophagitis, without bleeding: Secondary | ICD-10-CM | POA: Diagnosis not present

## 2022-06-29 DIAGNOSIS — G6181 Chronic inflammatory demyelinating polyneuritis: Secondary | ICD-10-CM

## 2022-06-29 DIAGNOSIS — F332 Major depressive disorder, recurrent severe without psychotic features: Secondary | ICD-10-CM

## 2022-06-29 MED ORDER — PANTOPRAZOLE SODIUM 40 MG PO TBEC: 40.0000 mg | DELAYED_RELEASE_TABLET | Freq: Every day | ORAL | 5 refills | Status: DC

## 2022-06-29 MED ORDER — ONDANSETRON 8 MG PO TBDP: 8.0000 mg | ORAL_TABLET | Freq: Three times a day (TID) | ORAL | 5 refills | Status: DC | PRN

## 2022-06-29 NOTE — Progress Notes (Signed)
   Subjective:    Patient ID: Penny [name], female    DOB: 09-01-1986, 36 y.o.   MRN: [id number]  HPI Digestive Issues - Epigastric pain with eating , esophagus tightness after meals, vomiting , acid reflux  Patient has severe regurgitation Also has moderate dysphagia issues with mainly solids but occasionally liquids Significant epigastric pain with eating denies any hematochezia Has history of gastric bypass.  History of digestive issues. Cough, congested more so when she has a severe regurgitation it chokes her.  Currently using 1 or 2 pillows to prop herself up.   Review of Systems     Objective:   Physical Exam General-in no acute distress Eyes-no discharge Lungs-respiratory rate normal, CTA CV-no murmurs,RRR Extremities skin warm dry no edema Neuro grossly normal Behavior normal, alert        Assessment & Plan:  Viral illness Should gradually get better Significant history of frequent vomiting Also has underlying health issues that are quite extensive Is in the process of applying for SSI disability It is still my opinion that she is unable to work due to multiple health issues and psychiatric issues.  The following was posted on 04/30/2022 and office note This patient is well-known to our practice.  She has been with [name] for many years.  She is going through multiple health issues throughout this time including neurologic issues, psychiatric issues, pseudoseizures, chronic pain, neuropathy, disassociative spells   I believe that she has adequately sought care with various specialists and has tried her best to be consistent with medication and following direction. Despite all this she remains disabled with these multiple issues I do not feel that this patient would be able to hold down a regular job.  The amount of pain and discomfort she has would necessitate the need for her to change positions frequently beyond what normal job would accommodate Also the amount of  spells that she has along with her panic attacks and fear of leaving the house increases the likelihood greatly that she would miss multiple days of work per week and not be able to maintain a standard work environment In my opinion she is disabled from a mental health standpoint and would benefit seeking this through her specialist as well as the [name] government/Social Security   Follow-up as needed

## 2022-06-30 LAB — CBC WITH DIFFERENTIAL/PLATELET
Basophils Absolute: 0 10*3/uL (ref 0.0–0.2)
Basos: 0 %
EOS (ABSOLUTE): 0.3 10*3/uL (ref 0.0–0.4)
Eos: 4 %
Hematocrit: 32.4 % — ABNORMAL LOW (ref 34.0–46.6)
Hemoglobin: 10.2 g/dL — ABNORMAL LOW (ref 11.1–15.9)
Immature Grans (Abs): 0 10*3/uL (ref 0.0–0.1)
Immature Granulocytes: 0 %
Lymphocytes Absolute: 1.6 10*3/uL (ref 0.7–3.1)
Lymphs: 23 %
MCH: 28.9 pg (ref 26.6–33.0)
MCHC: 31.5 g/dL (ref 31.5–35.7)
MCV: 92 fL (ref 79–97)
Monocytes Absolute: 0.7 10*3/uL (ref 0.1–0.9)
Monocytes: 10 %
Neutrophils Absolute: 4.4 10*3/uL (ref 1.4–7.0)
Neutrophils: 63 %
Platelets: 249 10*3/uL (ref 150–450)
RBC: 3.53 x10E6/uL — ABNORMAL LOW (ref 3.77–5.28)
RDW: 12.9 % (ref 11.7–15.4)
WBC: 7.1 10*3/uL (ref 3.4–10.8)

## 2022-06-30 LAB — COMPREHENSIVE METABOLIC PANEL
ALT: 16 IU/L (ref 0–32)
AST: 16 IU/L (ref 0–40)
Albumin/Globulin Ratio: 1.6 (ref 1.2–2.2)
Albumin: 3.8 g/dL — ABNORMAL LOW (ref 3.9–4.9)
Alkaline Phosphatase: 103 IU/L (ref 44–121)
BUN/Creatinine Ratio: 16 (ref 9–23)
BUN: 9 mg/dL (ref 6–20)
Bilirubin Total: 0.3 mg/dL (ref 0.0–1.2)
CO2: 26 mmol/L (ref 20–29)
Calcium: 8.9 mg/dL (ref 8.7–10.2)
Chloride: 102 mmol/L (ref 96–106)
Creatinine, Ser: 0.56 mg/dL — ABNORMAL LOW (ref 0.57–1.00)
Globulin, Total: 2.4 g/dL (ref 1.5–4.5)
Glucose: 83 mg/dL (ref 70–99)
Potassium: 4.8 mmol/L (ref 3.5–5.2)
Sodium: 138 mmol/L (ref 134–144)
Total Protein: 6.2 g/dL (ref 6.0–8.5)
eGFR: 123 mL/min/{1.73_m2} (ref >59)

## 2022-06-30 LAB — HELICOBACTER PYLORI  ANTIBODY, IGM: H pylori, IgM Abs: <9 units (ref 0.0–8.9)

## 2022-06-30 LAB — LIPASE: Lipase: 15 U/L (ref 14–72)

## 2022-07-01 ENCOUNTER — Encounter: Payer: Self-pay | Admitting: Family Medicine

## 2022-07-01 DIAGNOSIS — D649 Anemia, unspecified: Secondary | ICD-10-CM

## 2022-07-02 ENCOUNTER — Encounter: Payer: Self-pay | Admitting: Family Medicine

## 2022-07-02 DIAGNOSIS — Z79899 Other long term (current) drug therapy: Secondary | ICD-10-CM | POA: Diagnosis not present

## 2022-07-04 NOTE — Telephone Encounter (Signed)
Nurses No easy answers I would recommend that she keep a log book regarding the sweating spells If she is having severe sweats especially at nighttime-bad enough to cause changing of her sheets or her clothing she will need to do a follow-up visit for further work-up. Should be noted that vasomotor spells can occur for no good reason but sometimes are triggered by medications including antidepressants as well as opioids.  Thanks-Dr. Nicki Reaper

## 2022-07-04 NOTE — Telephone Encounter (Signed)
Nurses With the drop in her hemoglobin I would recommend further evaluation and follow-up Stool FIT stool test for blood Also hematocrit/hemoglobin-ferritin, TIBC, B12 Due to normochromic anemia

## 2022-07-05 DIAGNOSIS — D649 Anemia, unspecified: Secondary | ICD-10-CM | POA: Diagnosis not present

## 2022-07-05 NOTE — Telephone Encounter (Signed)
Please refer to previous mychart message.

## 2022-07-06 ENCOUNTER — Other Ambulatory Visit: Payer: Self-pay

## 2022-07-06 DIAGNOSIS — D649 Anemia, unspecified: Secondary | ICD-10-CM

## 2022-07-06 LAB — IRON,TIBC AND FERRITIN PANEL
Ferritin: 9 ng/mL — ABNORMAL LOW (ref 15–150)
Iron Saturation: 9 % — CL (ref 15–55)
Iron: 37 ug/dL (ref 27–159)
Total Iron Binding Capacity: 410 ug/dL (ref 250–450)
UIBC: 373 ug/dL (ref 131–425)

## 2022-07-06 LAB — HEMOGLOBIN AND HEMATOCRIT, BLOOD
Hematocrit: 37.3 % (ref 34.0–46.6)
Hemoglobin: 12.3 g/dL (ref 11.1–15.9)

## 2022-07-06 LAB — IFOBT (OCCULT BLOOD): IFOBT: NEGATIVE

## 2022-07-06 LAB — VITAMIN B12: Vitamin B-12: 1606 pg/mL — ABNORMAL HIGH (ref 232–1245)

## 2022-07-06 NOTE — Telephone Encounter (Signed)
Please see result note 

## 2022-07-07 ENCOUNTER — Telehealth: Payer: Self-pay | Admitting: *Deleted

## 2022-07-07 ENCOUNTER — Other Ambulatory Visit: Payer: Self-pay | Admitting: Family Medicine

## 2022-07-07 ENCOUNTER — Telehealth (INDEPENDENT_AMBULATORY_CARE_PROVIDER_SITE_OTHER): Payer: Self-pay

## 2022-07-07 ENCOUNTER — Encounter: Payer: Self-pay | Admitting: Family Medicine

## 2022-07-07 DIAGNOSIS — D509 Iron deficiency anemia, unspecified: Secondary | ICD-10-CM

## 2022-07-07 NOTE — Telephone Encounter (Signed)
Given her frequent need for iron infusions and chronic IDA, she warrants a formal evaluation by hematology. Unfortunately this aggressive behavior is not acceptable, neither towards me nor the clinic staff. Reba, please send her a discharge letter from our practice due to non-productive and aggressive patient to physician/staff relationship.  Thanks

## 2022-07-07 NOTE — Telephone Encounter (Signed)
See other my chart message- patient contacted

## 2022-07-07 NOTE — Telephone Encounter (Signed)
Per patient GI office is unable to order the iron infusion and it would need to come thru hematology. Consult with Dr Nicki Reaper- Order STAT Hematology referral. Referral ordered in [place].

## 2022-07-07 NOTE — Telephone Encounter (Signed)
Patient called today saying her pcp could not write orders for IV iron and wanted to send her to Hematology. She wanted to know if Dr.Castaneda would order the IV iron. I advised that I had spoken with him and he suggested to follow the orders of Dr.Scott and see the Hematologist as this is a disorder of the blood. Patient then proceeded to tell me that That is just [place] !, I am a single mother and this is my daughters birthday month and I am going to have to feel like SHIT!!! Just because Dr. Jenetta Downer does not want to write the orders, She then said WELL, Health care is wonderful, tell Dr. Jenetta Downer he can Shove IT up his Ass!!!.

## 2022-07-07 NOTE — Telephone Encounter (Signed)
STAT referral to hematology ordered in Epic- Patient is aware

## 2022-07-07 NOTE — Telephone Encounter (Signed)
Patient called and stated that GI office is not willing to order Iron infusion and recommends patient go thru hematology. Urgent referral to hematology ordered in [place].

## 2022-07-07 NOTE — Telephone Encounter (Signed)
Sorry you had to deal with that We have sent referral to hematology thank you-Dr. Nicki Reaper

## 2022-07-07 NOTE — Telephone Encounter (Signed)
1.  We will send request to hematology to have them help the patient #2 the patient needs to exercise some level of patience as best as possible If further questions please let me know

## 2022-07-07 NOTE — Telephone Encounter (Signed)
See result note- Patient would like to have the iron infusion done this week- she does not want to wait for hematology and would like Dr Nicki Reaper to connect with Dr Donnie Coffin to have them order this ASAP- Patient stated she is going to call the GI office and also tell them she needs an iron infusion ASAP this week.

## 2022-07-07 NOTE — Telephone Encounter (Signed)
Left message to return call 

## 2022-07-07 NOTE — Telephone Encounter (Signed)
Nurses Please connect with Dr. Colman Cater office to see if this is something that they do This is not something that I prescribe or manage. If Dr. Colman Cater office does not do this then hematology oncology is the correct choice Thank you

## 2022-07-08 ENCOUNTER — Encounter (INDEPENDENT_AMBULATORY_CARE_PROVIDER_SITE_OTHER): Payer: Self-pay | Admitting: *Deleted

## 2022-07-08 DIAGNOSIS — Z79899 Other long term (current) drug therapy: Secondary | ICD-10-CM | POA: Diagnosis not present

## 2022-07-08 NOTE — Patient Instructions (Addendum)
[place] at [facility] Discharge Instructions  You were seen and examined today by Dr. Delton Coombes. Dr. Delton Coombes is a hematologist, meaning that he specializes in blood abnormalities. Dr. Delton Coombes discussed your past medical history, family history of cancers/blood conditions and the events that led to you being here today.  You were referred to Dr. Delton Coombes due to iron deficiency anemia. Your recently done iron studies are quite low. You would likely benefit from iron infusions. Iron infusions are given here at the [facility], though it may take several weeks for your body to start utilizing the iron appropriately. Dr. Delton Coombes will recheck your iron studies prior to your next visit with either him or our PA, Rebekah Pennington.  Follow-up as scheduled.  Thank you for choosing [place] at [facility] to provide your oncology and hematology care.  To afford each patient quality time with our provider, please arrive at least 15 minutes before your scheduled appointment time.   If you have a lab appointment with the [place] please come in thru the Main Entrance and check in at the main information desk.  You need to re-schedule your appointment should you arrive 10 or more minutes late.  We strive to give you quality time with our providers, and arriving late affects you and other patients whose appointments are after yours.  Also, if you no show three or more times for appointments you may be dismissed from the clinic at the providers discretion.     Again, thank you for choosing [facility].  Our hope is that these requests will decrease the amount of time that you wait before being seen by our physicians.       _____________________________________________________________  Should you have questions after your visit to [facility], please contact our office at [phone] and follow the prompts.  Our  office hours are 8:00 a.m. and 4:30 p.m. Monday - Friday.  Please note that voicemails left after 4:00 p.m. may not be returned until the following business day.  We are closed weekends and major holidays.  You do have access to a nurse 24-7, just call the main number to the clinic [phone] and do not press any options, hold on the line and a nurse will answer the phone.    For prescription refill requests, have your pharmacy contact our office and allow 72 hours.

## 2022-07-09 ENCOUNTER — Inpatient Hospital Stay: Payer: Medicaid Other | Attending: Hematology | Admitting: Hematology

## 2022-07-09 ENCOUNTER — Encounter: Payer: Self-pay | Admitting: Hematology

## 2022-07-09 VITALS — BP 118/80 | HR 103 | Temp 97.1°F | Resp 18 | Ht 66.0 in | Wt 161.0 lb

## 2022-07-09 DIAGNOSIS — Z8051 Family history of malignant neoplasm of kidney: Secondary | ICD-10-CM | POA: Diagnosis not present

## 2022-07-09 DIAGNOSIS — Z808 Family history of malignant neoplasm of other organs or systems: Secondary | ICD-10-CM | POA: Diagnosis not present

## 2022-07-09 DIAGNOSIS — F17210 Nicotine dependence, cigarettes, uncomplicated: Secondary | ICD-10-CM | POA: Diagnosis not present

## 2022-07-09 DIAGNOSIS — Z9884 Bariatric surgery status: Secondary | ICD-10-CM | POA: Diagnosis not present

## 2022-07-09 DIAGNOSIS — D509 Iron deficiency anemia, unspecified: Secondary | ICD-10-CM

## 2022-07-09 DIAGNOSIS — Z803 Family history of malignant neoplasm of breast: Secondary | ICD-10-CM | POA: Diagnosis not present

## 2022-07-09 DIAGNOSIS — D500 Iron deficiency anemia secondary to blood loss (chronic): Secondary | ICD-10-CM

## 2022-07-09 NOTE — Progress Notes (Signed)
[facility] [address] [place] [address]   CLINIC:  Medical Oncology/Hematology  Patient Care Team: Kathyrn Drown, MD as PCP - General (Family Medicine) Josue Hector, MD as PCP - Cardiology (Cardiology) Danie Binder, MD (Inactive) (Gastroenterology) Derek Jack, MD as Medical Oncologist (Hematology)  CHIEF COMPLAINTS/PURPOSE OF CONSULTATION:  Evaluation for IDA  HISTORY OF PRESENTING ILLNESS:  Penny [name] 36 y.o. female is here because of evaluation for IDA, at the request of Dr. Wolfgang Phoenix.  She had a long history of iron deficiency anemia since she had gastric bypass in 2007.  She successfully lost about 150 pounds since surgery.  She has required intermittent IV iron since 2009.  Recent labs showed her ferritin was 9 and percent saturation was 9 on 07/05/2022 with hemoglobin 10.2.  She reports having ice pica.  She reports feeling very tired and sluggish with no appetite and has sweating spells which is typical of low iron levels in her body.  She is also on vitamin B12 and vitamin D2 supplements since gastric bypass surgery.  Last blood transfusion was in 2015.  She has tolerated previous Feraheme very well.  Last Shirlean Kelly was in last year.  She gets nauseous with Feraheme.   MEDICAL HISTORY:  Past Medical History:  Diagnosis Date   ADD (attention deficit disorder)    Anemia 2011   2o to GASTRIC BYPASS   Anginal pain ([place])    Anxiety    Asthma    Blood transfusion without reported diagnosis    Chronic daily headache    Depression    Depression    Dysmenorrhea 09/18/2013   Dysrhythmia    Elevated liver enzymes JUL 2011ALK PHOS 111-127 AST  143-267 ALT  213-321T BILI 0.6  ALB  3.7-4.06 Jun 2011 ALK PHOS 118 AST 24 ALT 42 T BILI 0.4 ALB 3.9   Encounter for drug rehabilitation    at behavioral health for opioid addiction about 4 years ago   Family history of adverse reaction to anesthesia    'dad had to be kept on pump for breathing for  morphine'   Fatty liver    Fibroid 01/18/2017   Fibromyalgia    Gastric bypass status for obesity    Gastritis JULY 2011   Heart murmur    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   History of Holter monitoring    Hx of opioid abuse ([place])    for about 4 years, about 4 years ago   Interstitial cystitis    Iron deficiency anemia 07/23/2010   Irritable bowel syndrome 2012 DIARRHEA   JUN 2012 TTG IgA 14.9   IUD FEB 2010   Lupus ([place])    Menorrhagia 07/18/2013   Migraines    Obesity (BMI 30-39.9) 2011 228 LBS BMI 36.8   Osteoporosis    Ovarian cyst    Patient desires pregnancy 09/18/2013   Polyneuropathy    PONV (postoperative nausea and vomiting) seizure post-operatively   seizure following ankle surgery   Potassium (K) deficiency    Pregnant 12/25/2013   Psychiatric pseudoseizure    RLQ abdominal pain 07/18/2013   Sciatica of left side 11/14/2014   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   Stress 09/18/2013    SURGICAL HISTORY: Past Surgical History:  Procedure Laterality Date   BIOPSY  04/10/2015   Procedure: BIOPSY;  Surgeon: Daneil Dolin, MD;  Location: [facility];  Service: Endoscopy;;   BIOPSY  04/15/2021   Procedure:  BIOPSY;  Surgeon: Montez Morita, Quillian Quince, MD;  [facility];  Service: Gastroenterology;;   cath self every nite     for sodium bicarb injection (discontinued 2013)   CHOLECYSTECTOMY  2005   biliary dyskinesia   COLONOSCOPY  JUN 2012 ABD PN/DIARRHEA WITH PROPOFOL   NL COLON   COLONOSCOPY WITH PROPOFOL N/A 01/16/2021   Procedure: COLONOSCOPY WITH PROPOFOL;  Surgeon: Harvel Quale, MD;  [facility];  Service: Gastroenterology;  Laterality: N/A;  1:45   DILATION AND CURETTAGE OF UTERUS     DILITATION & CURRETTAGE/HYSTROSCOPY WITH NOVASURE ABLATION N/A 03/24/2017   Procedure: DILATATION & CURETTAGE/HYSTEROSCOPY WITH NOVASURE ENDOMETRIAL ABLATION;  Surgeon: Jonnie Kind, MD;  Location: [facility];  Service:  Gynecology;  Laterality: N/A;   ESOPHAGEAL DILATION N/A 04/10/2015   Procedure: ESOPHAGEAL DILATION WITH 54FR MALONEY DILATOR;  Surgeon: Daneil Dolin, MD;  Location: [facility];  Service: Endoscopy;  Laterality: N/A;   ESOPHAGOGASTRODUODENOSCOPY     ESOPHAGOGASTRODUODENOSCOPY (EGD) WITH PROPOFOL N/A 04/10/2015   Procedure: ESOPHAGOGASTRODUODENOSCOPY (EGD) WITH PROPOFOL;  Surgeon: Daneil Dolin, MD;  Location: [facility];  Service: Endoscopy;  Laterality: N/A;   ESOPHAGOGASTRODUODENOSCOPY (EGD) WITH PROPOFOL N/A 12/06/2016   Procedure: ESOPHAGOGASTRODUODENOSCOPY (EGD) WITH PROPOFOL;  Surgeon: Danie Binder, MD;  [facility];  Service: Endoscopy;  Laterality: N/A;   ESOPHAGOGASTRODUODENOSCOPY (EGD) WITH PROPOFOL N/A 04/27/2019   Procedure: ESOPHAGOGASTRODUODENOSCOPY (EGD) WITH PROPOFOL;  Surgeon: Rogene Houston, MD;  [facility];  Service: Endoscopy;  Laterality: N/A;  730   ESOPHAGOGASTRODUODENOSCOPY (EGD) WITH PROPOFOL N/A 08/31/2019   Procedure: ESOPHAGOGASTRODUODENOSCOPY (EGD) WITH PROPOFOL;  Surgeon: Rogene Houston, MD;  [facility];  Service: Endoscopy;  Laterality: N/A;   ESOPHAGOGASTRODUODENOSCOPY (EGD) WITH PROPOFOL N/A 01/16/2021   Procedure: ESOPHAGOGASTRODUODENOSCOPY (EGD) WITH PROPOFOL;  Surgeon: Harvel Quale, MD;  [facility];  Service: Gastroenterology;  Laterality: N/A;   ESOPHAGOGASTRODUODENOSCOPY (EGD) WITH PROPOFOL N/A 04/15/2021   Procedure: ESOPHAGOGASTRODUODENOSCOPY (EGD) WITH PROPOFOL;  Surgeon: Harvel Quale, MD;  [facility];  Service: Gastroenterology;  Laterality: N/A;  9:00 AM   GAB  2007   in [place]-POUCH 5 CM   GASTRIC BYPASS  06/2006   HYSTEROSCOPY WITH D & C N/A 09/12/2014   Procedure: DILATATION AND CURETTAGE /HYSTEROSCOPY;  Surgeon: Jonnie Kind, MD;  Location: [facility];  Service: Gynecology;  Laterality: N/A;   KYPHOPLASTY N/A 08/02/2018   Procedure: KYPHOPLASTY T12;  Surgeon: Melina Schools, MD;  Location: [facility];  Service: Orthopedics;  Laterality: N/A;  60 mins   LAPAROSCOPIC TUBAL LIGATION Bilateral 03/24/2017   Procedure: LAPAROSCOPIC TUBAL LIGATION ([place] Rings);  Surgeon: Jonnie Kind, MD;  Location: [facility];  Service: Gynecology;  Laterality: Bilateral;   REPAIR VAGINAL CUFF N/A 07/30/2014   Procedure: REPAIR VAGINAL CUFF;  Surgeon: Mora Bellman, MD;  Location: [facility];  Service: Gynecology;  Laterality: N/A;   SAVORY DILATION  06/20/2012   Dr. Barnie Alderman gastritis/Ulcer in the mid jejunum. Empiric dilation.    SURAL NERVE BX Left 02/25/2016   Procedure: LEFT SURAL NERVE BIOPSY;  Surgeon: Jovita Gamma, MD;  Location: [facility];  Service: Neurosurgery;  Laterality: Left;  Left sural nerve biopsy   TONSILLECTOMY     TONSILLECTOMY AND ADENOIDECTOMY     UPPER GASTROINTESTINAL ENDOSCOPY  JULY 2011 NAUSEA-D125,V6, PH 25   Bx; GASTRITIS, POUCH-5 CM LONG   WISDOM TOOTH EXTRACTION      SOCIAL HISTORY: Social History  Socioeconomic History   Marital status: Divorced    Spouse name: Not on file   Number of children: 2   Years of education: some college   Highest education level: Some college, no degree  Occupational History   Occupation: Applying for [employer] [place]: NOT EMPLOYED  Tobacco Use   Smoking status: Former    Packs/day: 0.25    Years: 6.00    Total pack years: 1.50    Types: Cigarettes    Start date: 05/2018   Smokeless tobacco: Never  Vaping Use   Vaping Use: Some days  Substance and Sexual Activity   Alcohol use: Not Currently    Alcohol/week: 0.0 standard drinks of alcohol   Drug use: Not Currently   Sexual activity: Yes    Partners: Male    Birth control/protection: Surgical, Condom    Comment: tubal and ablation  Other Topics Concern   Not on file  Social History Narrative   Patient is right handed.   Patient drinks one cup of coffee daily.   She lives in a one-story home with her two children (37 year  old son, 27 month old daughter).   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 in the Last Year: Sometimes true    Ran Out of Food in the Last Year: Sometimes true  Transportation Needs: No Transportation Needs (08/25/2021)   PRAPARE - Hydrologist (Medical): No    Lack of Transportation (Non-Medical): No  Physical Activity: Inactive (08/25/2021)   Exercise Vital Sign    Days of Exercise per Week: 0 days    Minutes of Exercise per Session: 10 min  Stress: Stress Concern Present (08/25/2021)   [place]    Feeling of Stress : Very much  Social Connections: Moderately Isolated (08/25/2021)   Social Connection and Isolation Panel [NHANES]    Frequency of Communication with Friends and Family: More than three times a week    Frequency of Social Gatherings with Friends and Family: Once a week    Attends Religious Services: 1 to 4 times per year    Active Member of [employer] or Organizations: No    Attends Archivist Meetings: Never    Marital Status: Divorced  Human resources officer Violence: Not At Risk (08/25/2021)   Humiliation, Afraid, Rape, and Kick questionnaire    Fear of Current or Ex-Partner: No    Emotionally Abused: No    Physically Abused: No    Sexually Abused: No    FAMILY HISTORY: Family History  Problem Relation Age of Onset   Coronary artery disease Paternal Grandfather    Migraines Paternal Grandmother    Breast cancer Paternal Grandmother    Hemochromatosis Maternal Grandmother    Migraines Maternal Grandmother    Cancer Maternal  Grandmother    Breast cancer Maternal Grandmother    Hypertension Father    Diabetes Father    Coronary artery disease Father    Cancer Mother        breast   Hemochromatosis Mother    Breast cancer  Mother    Depression Mother    Anxiety disorder Mother    Anxiety disorder Brother    Bipolar disorder Brother    Healthy Daughter    Healthy Son     ALLERGIES:  is allergic to gabapentin, metoclopramide hcl, tramadol, ativan [lorazepam], latex, lyrica [pregabalin], iron, sulfa antibiotics, butrans [buprenorphine], nucynta [tapentadol], propofol, tape, and toradol [ketorolac tromethamine].  MEDICATIONS:  Current Outpatient Medications  Medication Sig Dispense Refill   albuterol (VENTOLIN HFA) 108 (90 Base) MCG/ACT inhaler Inhale into the lungs every 6 (six) hours as needed for wheezing or shortness of breath.     amphetamine-dextroamphetamine (ADDERALL XR) 30 MG 24 hr capsule Take 1 capsule (30 mg total) by mouth every morning. 30 capsule 0   buprenorphine (SUBUTEX) 8 MG SUBL SL tablet Place 4 mg under the tongue 2 (two) times daily.     Calcium Carb-Cholecalciferol (CALCIUM-VITAMIN D3) 600-500 MG-UNIT CAPS Take 1 tablet by mouth daily.     cefdinir (OMNICEF) 300 MG capsule Take 1 capsule (300 mg total) by mouth 2 (two) times daily. 14 capsule 0   cetirizine (ZYRTEC) 10 MG tablet TAKE ONE TABLET BY MOUTH DAILY 30 tablet 0   clidinium-chlordiazePOXIDE (LIBRAX) 5-2.5 MG capsule TAKE (1) CAPSULE BY MOUTH 3 TIMES DAILY BEFORE MEALS. 90 capsule 0   clonazePAM (KLONOPIN) 0.5 MG tablet Take 0.5 mg by mouth 2 (two) times daily as needed.     cyclobenzaprine (FLEXERIL) 10 MG tablet Take 10 mg by mouth 3 (three) times daily.     DULoxetine (CYMBALTA) 60 MG capsule Take 1 capsule (60 mg total) by mouth 2 (two) times daily. 180 capsule 2   Erenumab-aooe (AIMOVIG) 140 MG/ML SOAJ Inject 140 mg into the skin every 28 (twenty-eight) days. 1 mL 11   fluticasone (FLOVENT HFA) 220 MCG/ACT inhaler Inhale 2  puffs into the lungs 2 (two) times daily. Do not inhale, swallow powder and drink water , do not eat food for 20 minutes after puffs 4 each 2   HYDROmorphone (DILAUDID) 4 MG tablet Take 4 mg by mouth every 4 (four) hours as needed.     levalbuterol (XOPENEX HFA) 45 MCG/ACT inhaler INHALE (2) PUFFS INTO THE LUNGS EVERY SIX HOURS AS NEEDED FOR WHEEZING. 15 g 3   levalbuterol (XOPENEX) 1.25 MG/3ML nebulizer solution Take 1.25 mg by nebulization every 4 (four) hours as needed for wheezing. 50 mL 3   linaclotide (LINZESS) 290 MCG CAPS capsule Take 1 capsule (290 mcg total) by mouth daily before breakfast. 90 capsule 3   pantoprazole (PROTONIX) 40 MG tablet Take 1 tablet (40 mg total) by mouth daily. 30 tablet 5   Rimegepant Sulfate (NURTEC) 75 MG TBDP Take 1 tablet as needed for severe migraine.  Limit to twice per week. 10 tablet 3   traZODone (DESYREL) 100 MG tablet Take 2 tablets (200 mg total) by mouth at bedtime. 60 tablet 2   valACYclovir (VALTREX) 1000 MG tablet TAKE 1 TABLET BY MOUTH DAILY. 30 tablet PRN   Vitamin D, Ergocalciferol, (DRISDOL) 1.25 MG (50000 UNIT) CAPS capsule Take 50,000 Units by mouth every Wednesday.     fluconazole (DIFLUCAN) 150 MG tablet Take 1 tablet (150 mg total) by mouth once a week. (Patient not taking: Reported on 07/09/2022) 2 tablet 0   ondansetron (ZOFRAN-ODT) 8 MG disintegrating tablet Take 1 tablet (8 mg total) by mouth every 8 (eight) hours as needed for nausea or vomiting. (Patient not taking: Reported on 07/09/2022) 15 tablet 5   promethazine (PHENERGAN)  12.5 MG tablet TAKE 1 TABLET BY MOUTH EVERY 8 HOURS AS NEEDED FOR NAUSEA OR VOMITING (Patient not taking: Reported on 07/09/2022) 30 tablet 0   promethazine (PHENERGAN) 25 MG tablet TAKE (1) TABLET BY MOUTH TWICE A DAY AS NEEDED. (Patient not taking: Reported on 07/09/2022) 30 tablet 2   No current facility-administered medications for this visit.    REVIEW OF SYSTEMS:   Review of Systems  Constitutional:  Positive  for fatigue.  Respiratory:  Positive for cough.   Cardiovascular:  Positive for palpitations.  Gastrointestinal:  Positive for nausea.  Neurological:  Positive for dizziness and headaches.  Psychiatric/Behavioral:  Positive for depression and sleep disturbance. The patient is nervous/anxious.   All other systems reviewed and are negative.    PHYSICAL EXAMINATION: ECOG PERFORMANCE STATUS: 1 - Symptomatic but completely ambulatory  Vitals:   07/09/22 1126  BP: 118/80  Pulse: (!) 103  Resp: 18  Temp: (!) 97.1 F (36.2 C)  SpO2: 97%   Filed Weights   07/09/22 1126  Weight: 161 lb (73 kg)   Physical Exam Vitals reviewed.  Constitutional:      Appearance: Normal appearance.  Cardiovascular:     Rate and Rhythm: Normal rate and regular rhythm.     Heart sounds: Normal heart sounds.  Pulmonary:     Effort: Pulmonary effort is normal.     Breath sounds: Normal breath sounds.  Abdominal:     Palpations: Abdomen is soft. There is no mass.  Musculoskeletal:        General: No swelling.  Neurological:     General: No focal deficit present.     Mental Status: She is alert and oriented to person, place, and time.  Psychiatric:        Mood and Affect: Mood normal.        Behavior: Behavior normal.      LABORATORY DATA:  I have reviewed the data as listed Recent Results (from the past 2160 hour(s))  CBC with Differential/Platelet     Status: Abnormal   Collection Time: 06/29/22 12:02 PM  Result Value Ref Range   WBC 7.1 3.4 - 10.8 x10E3/uL   RBC 3.53 (L) 3.77 - 5.28 x10E6/uL   Hemoglobin 10.2 (L) 11.1 - 15.9 g/dL   Hematocrit 32.3 (L) 19.2 - 46.6 %   MCV 92 79 - 97 fL   MCH 28.9 26.6 - 33.0 pg   MCHC 31.5 31.5 - 35.7 g/dL   RDW 47.7 88.9 - 93.5 %   Platelets 249 150 - 450 x10E3/uL   Neutrophils 63 Not Estab. %   Lymphs 23 Not Estab. %   Monocytes 10 Not Estab. %   Eos 4 Not Estab. %   Basos 0 Not Estab. %   Neutrophils Absolute 4.4 1.4 - 7.0 x10E3/uL    Lymphocytes Absolute 1.6 0.7 - 3.1 x10E3/uL   Monocytes Absolute 0.7 0.1 - 0.9 x10E3/uL   EOS (ABSOLUTE) 0.3 0.0 - 0.4 x10E3/uL   Basophils Absolute 0.0 0.0 - 0.2 x10E3/uL   Immature Granulocytes 0 Not Estab. %   Immature Grans (Abs) 0.0 0.0 - 0.1 x10E3/uL  Comprehensive metabolic panel     Status: Abnormal   Collection Time: 06/29/22 12:02 PM  Result Value Ref Range   Glucose 83 70 - 99 mg/dL   BUN 9 6 - 20 mg/dL   Creatinine, Ser 4.11 (L) 0.57 - 1.00 mg/dL   eGFR 320 >59 BV/GZL/5.95   BUN/Creatinine Ratio 16 9 - 23  Sodium 138 134 - 144 mmol/L   Potassium 4.8 3.5 - 5.2 mmol/L   Chloride 102 96 - 106 mmol/L   CO2 26 20 - 29 mmol/L   Calcium 8.9 8.7 - 10.2 mg/dL   Total Protein 6.2 6.0 - 8.5 g/dL   Albumin 3.8 (L) 3.9 - 4.9 g/dL   Globulin, Total 2.4 1.5 - 4.5 g/dL   Albumin/Globulin Ratio 1.6 1.2 - 2.2   Bilirubin Total 0.3 0.0 - 1.2 mg/dL   Alkaline Phosphatase 103 44 - 121 IU/L   AST 16 0 - 40 IU/L   ALT 16 0 - 32 IU/L  Lipase     Status: None   Collection Time: 06/29/22 12:02 PM  Result Value Ref Range   Lipase 15 14 - 72 U/L  HELICOBACTER PYLORI  ANTIBODY, IGM     Status: None   Collection Time: 06/29/22 12:02 PM  Result Value Ref Range   H pylori, IgM Abs <9.0 0.0 - 8.9 units    Comment:                                 Negative          <9.0                                 Equivocal   9.0 - 11.0                                 Positive         >11.0 This test was developed and its performance characteristics determined by [facility]. It has not been cleared or approved by the Food and Drug Administration.   Hemoglobin and Hematocrit, Blood     Status: None   Collection Time: 07/05/22 11:33 AM  Result Value Ref Range   Hemoglobin 12.3 11.1 - 15.9 g/dL   Hematocrit 37.3 34.0 - 46.6 %  Iron, TIBC and Ferritin Panel     Status: Abnormal   Collection Time: 07/05/22 11:33 AM  Result Value Ref Range   Total Iron Binding Capacity 410 250 - 450 ug/dL   UIBC 373 131 - 425  ug/dL   Iron 37 27 - 159 ug/dL   Iron Saturation 9 (LL) 15 - 55 %   Ferritin 9 (L) 15 - 150 ng/mL  Vitamin B12     Status: Abnormal   Collection Time: 07/05/22 11:33 AM  Result Value Ref Range   Vitamin B-12 1,606 (H) 232 - 1,245 pg/mL  IFOBT POC (occult bld, rslt in office)     Status: None   Collection Time: 07/06/22 10:52 AM  Result Value Ref Range   IFOBT Negative     RADIOGRAPHIC STUDIES: I have personally reviewed the radiological images as listed and agreed with the findings in the report. No results found.  ASSESSMENT:  Iron deficiency anemia: - History of gastric bypass surgery in 2007 - Requiring intermittent IV iron since 2009, last Feraheme in 2022 - EGD (04/15/2021): Pepper mesh appearance of mucosa in the esophagus.  Gastric bypass with about 5 cm in length and intact staple line.  GJ anastomosis with healthy mucosa.  Normal jejunum. - Colonoscopy (01/16/2021): Preparation of the colon inadequate.  Hemorrhoids, no large masses/polyps/old blood. - She is on oral B12 and vitamin D2  pills since gastric bypass surgery.  Social/family history: - She is a single mother.  She used to work for EMS/fire department.  In 2019, she stopped working due to work-related injury.  She is non-smoker. - Maternal grandmother had hemochromatosis and breast cancer.  Mother had hemochromatosis and breast cancer.  Maternal grandfather had kidney cancer.  Maternal uncle had liver cancer.   PLAN:  Iron deficiency anemia: - Most likely from malabsorption of iron from gastric bypass surgery. - I have reviewed with her recent labs which showed ferritin 9 and hemoglobin 10.2. - We will schedule her for Feraheme weekly x3.  We discussed side effects in detail. - We will give Compazine as premedication for him as she gets nauseous with it. - We will see her back in 6 months with repeat CBC, ferritin, iron panel and reticulocyte's.   All questions were answered. The patient knows to call the  clinic with any problems, questions or concerns.  Derek Jack, MD 07/09/22 12:25 PM  [facility] [phone]   I, Thana Ates, am acting as a scribe for Dr. Derek Jack.  .skatyt \

## 2022-07-12 ENCOUNTER — Inpatient Hospital Stay: Payer: Medicaid Other

## 2022-07-12 ENCOUNTER — Inpatient Hospital Stay: Payer: Medicaid Other | Admitting: Hematology

## 2022-07-12 VITALS — BP 123/82 | HR 78 | Temp 96.6°F | Resp 18

## 2022-07-12 DIAGNOSIS — D500 Iron deficiency anemia secondary to blood loss (chronic): Secondary | ICD-10-CM

## 2022-07-12 DIAGNOSIS — R110 Nausea: Secondary | ICD-10-CM

## 2022-07-12 DIAGNOSIS — D509 Iron deficiency anemia, unspecified: Secondary | ICD-10-CM | POA: Diagnosis not present

## 2022-07-12 MED ORDER — LORATADINE 10 MG PO TABS
10.0000 mg | ORAL_TABLET | Freq: Once | ORAL | Status: AC
  Administered 2022-07-12: 10 mg via ORAL
  Filled 2022-07-12: qty 1

## 2022-07-12 MED ORDER — PROCHLORPERAZINE MALEATE 10 MG PO TABS
10.0000 mg | ORAL_TABLET | Freq: Four times a day (QID) | ORAL | Status: DC | PRN
  Administered 2022-07-12: 10 mg via ORAL
  Filled 2022-07-12: qty 1

## 2022-07-12 MED ORDER — METHYLPREDNISOLONE SODIUM SUCC 125 MG IJ SOLR
125.0000 mg | Freq: Once | INTRAMUSCULAR | Status: AC
  Administered 2022-07-12: 125 mg via INTRAVENOUS
  Filled 2022-07-12: qty 2

## 2022-07-12 MED ORDER — ACETAMINOPHEN 325 MG PO TABS: 650.0000 mg | ORAL_TABLET | Freq: Once | ORAL | Status: DC

## 2022-07-12 MED ORDER — SODIUM CHLORIDE 0.9 % IV SOLN: Freq: Once | INTRAVENOUS | Status: AC

## 2022-07-12 MED ORDER — SODIUM CHLORIDE 0.9 % IV SOLN
510.0000 mg | Freq: Once | INTRAVENOUS | Status: DC
  Filled 2022-07-12: qty 17

## 2022-07-12 MED ORDER — SODIUM CHLORIDE 0.9 % IV SOLN
510.0000 mg | Freq: Once | INTRAVENOUS | Status: AC
  Administered 2022-07-12: 510 mg via INTRAVENOUS
  Filled 2022-07-12: qty 17

## 2022-07-12 MED ORDER — FAMOTIDINE IN NACL 20-0.9 MG/50ML-% IV SOLN
20.0000 mg | Freq: Once | INTRAVENOUS | Status: AC
  Administered 2022-07-12: 20 mg via INTRAVENOUS
  Filled 2022-07-12: qty 50

## 2022-07-12 NOTE — Patient Instructions (Signed)
[facility]  Discharge Instructions: Thank you for choosing [facility] to provide your oncology and hematology care.  If you have a lab appointment with the Cancer Center, please come in thru the Main Entrance and check in at the main information desk.  Wear comfortable clothing and clothing appropriate for easy access to any Portacath or PICC line.   We strive to give you quality time with your provider. You may need to reschedule your appointment if you arrive late (15 or more minutes).  Arriving late affects you and other patients whose appointments are after yours.  Also, if you miss three or more appointments without notifying the office, you may be dismissed from the clinic at the provider's discretion.      For prescription refill requests, have your pharmacy contact our office and allow 72 hours for refills to be completed.    Today you received the following Feraheme, return as scheduled.   To help prevent nausea and vomiting after your treatment, we encourage you to take your nausea medication as directed.  BELOW ARE SYMPTOMS THAT SHOULD BE REPORTED IMMEDIATELY: *FEVER GREATER THAN 100.4 F (38 C) OR HIGHER *CHILLS OR SWEATING *NAUSEA AND VOMITING THAT IS NOT CONTROLLED WITH YOUR NAUSEA MEDICATION *UNUSUAL SHORTNESS OF BREATH *UNUSUAL BRUISING OR BLEEDING *URINARY PROBLEMS (pain or burning when urinating, or frequent urination) *BOWEL PROBLEMS (unusual diarrhea, constipation, pain near the anus) TENDERNESS IN MOUTH AND THROAT WITH OR WITHOUT PRESENCE OF ULCERS (sore throat, sores in mouth, or a toothache) UNUSUAL RASH, SWELLING OR PAIN  UNUSUAL VAGINAL DISCHARGE OR ITCHING   Items with * indicate a potential emergency and should be followed up as soon as possible or go to the Emergency Department if any problems should occur.  Please show the CHEMOTHERAPY ALERT CARD or IMMUNOTHERAPY ALERT CARD at check-in to the Emergency Department and  triage nurse.  Should you have questions after your visit or need to cancel or reschedule your appointment, please contact [facility] [phone]  and follow the prompts.  Office hours are 8:00 a.m. to 4:30 p.m. Monday - Friday. Please note that voicemails left after 4:00 p.m. may not be returned until the following business day.  We are closed weekends and major holidays. You have access to a nurse at all times for urgent questions. Please call the main number to the clinic [phone] and follow the prompts.  For any non-urgent questions, you may also contact your provider using MyChart. We now offer e-Visits for anyone 18 and older to request care online for non-urgent symptoms. For details visit [email or web address].   Also download the MyChart app! Go to the app store, search "MyChart", open the app, select [facility], and log in with your MyChart username and password.  Masks are optional in the cancer centers. If you would like for your care team to wear a mask while they are taking care of you, please let them know. For doctor visits, patients may have with them one support person who is at least 35 years old. At this time, visitors are not allowed in the infusion area.  

## 2022-07-12 NOTE — Progress Notes (Signed)
Patient presents today for Feraheme infusion, Infusion ran over 1 hour due to past intolerance, pre-medications added to plan, patient refuses to tylenol due to gastric bypass, pharmacy aware. Iron finished at 1450, at 1455 patient reports seeing, black spots and headache and nausea. Patient states she is prone to migraines. Dr. Delton Coombes made aware fluids increased to 541m/hr for 317ms and compazine given per orders. Patient stated she felt better after 30 minute wait time.

## 2022-07-13 ENCOUNTER — Ambulatory Visit (HOSPITAL_COMMUNITY): Payer: Medicaid Other

## 2022-07-15 ENCOUNTER — Other Ambulatory Visit: Payer: Self-pay | Admitting: Family Medicine

## 2022-07-15 ENCOUNTER — Other Ambulatory Visit (INDEPENDENT_AMBULATORY_CARE_PROVIDER_SITE_OTHER): Payer: Self-pay | Admitting: Gastroenterology

## 2022-07-15 DIAGNOSIS — K589 Irritable bowel syndrome without diarrhea: Secondary | ICD-10-CM

## 2022-07-15 NOTE — Telephone Encounter (Signed)
Letter sent to patient from [facility] on 8/3 letting her know office would provide urgent care for next 30 days.

## 2022-07-15 NOTE — Telephone Encounter (Signed)
Thanks, I will prescribe 30 day supply, she will need to reach her PCP or other GI provider for further refills

## 2022-07-16 ENCOUNTER — Ambulatory Visit (HOSPITAL_COMMUNITY): Payer: Medicaid Other

## 2022-07-19 ENCOUNTER — Inpatient Hospital Stay: Payer: Medicaid Other

## 2022-07-21 ENCOUNTER — Other Ambulatory Visit (HOSPITAL_COMMUNITY): Payer: Self-pay | Admitting: Psychiatry

## 2022-07-22 ENCOUNTER — Encounter (HOSPITAL_COMMUNITY): Payer: Self-pay | Admitting: Psychiatry

## 2022-07-22 ENCOUNTER — Telehealth (INDEPENDENT_AMBULATORY_CARE_PROVIDER_SITE_OTHER): Payer: Medicaid Other | Admitting: Psychiatry

## 2022-07-22 DIAGNOSIS — F411 Generalized anxiety disorder: Secondary | ICD-10-CM

## 2022-07-22 DIAGNOSIS — F900 Attention-deficit hyperactivity disorder, predominantly inattentive type: Secondary | ICD-10-CM

## 2022-07-22 DIAGNOSIS — F332 Major depressive disorder, recurrent severe without psychotic features: Secondary | ICD-10-CM | POA: Diagnosis not present

## 2022-07-22 MED ORDER — DEXMETHYLPHENIDATE HCL ER 30 MG PO CP24
30.0000 mg | ORAL_CAPSULE | ORAL | 0 refills | Status: DC
Start: 2022-07-22 — End: 2022-08-24

## 2022-07-22 MED ORDER — DULOXETINE HCL 60 MG PO CPEP
60.0000 mg | ORAL_CAPSULE | Freq: Two times a day (BID) | ORAL | 2 refills | Status: DC
Start: 2022-07-22 — End: 2022-09-20

## 2022-07-22 MED ORDER — CLONAZEPAM 0.5 MG PO TABS
0.5000 mg | ORAL_TABLET | Freq: Two times a day (BID) | ORAL | 2 refills | Status: DC | PRN
Start: 2022-07-22 — End: 2022-09-17

## 2022-07-22 MED ORDER — TRAZODONE HCL 100 MG PO TABS: 200.0000 mg | ORAL_TABLET | Freq: Every day | ORAL | 2 refills | Status: DC

## 2022-07-22 NOTE — Progress Notes (Signed)
Virtual Visit via Telephone Note  I connected with Penny [name] on 07/22/22 at  3:20 PM EDT by telephone and verified that I am speaking with the correct person using two identifiers.  Location: Patient: home Provider: office   I discussed the limitations, risks, security and privacy concerns of performing an evaluation and management service by telephone and the availability of in person appointments. I also discussed with the patient that there may be a patient responsible charge related to this service. The patient expressed understanding and agreed to proceed.      I discussed the assessment and treatment plan with the patient. The patient was provided an opportunity to ask questions and all were answered. The patient agreed with the plan and demonstrated an understanding of the instructions.   The patient was advised to call back or seek an in-person evaluation if the symptoms worsen or if the condition fails to improve as anticipated.  I provided 15 minutes of non-face-to-face time during this encounter.   Penny Spiller, MD  [facility] MD/PA/NP OP Progress Note  07/22/2022 3:38 PM Penny [name]  MRN:  [id number]  Chief Complaint:  Chief Complaint  Patient presents with   Depression   Anxiety   ADD   Follow-up   HPI:  This patient is a 36 year old female who is now living alone in [place].  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  She has 2 children with whom she shares custody with their fathers  The patient returns for follow-up after 3 months.  She states that her ferritin is low again and she is suffering from iron deficiency anemia with associated fatigue.  She has had 1 iron infusion and will have another 1 this coming week.  She is sleeping a lot and does not have much energy.  She states that she feels depressed but is hard to differentiate this so much from the low energy.  She is seeing a therapist on a weekly basis.  She and her children are moving to a  new apartment and she is excited about this.  She would like to try another stimulant because she states that sometimes the Adderall causes depression.  I recently put her son on Focalin XR and she would like to try this.  I think this is reasonable.  Since she is sleeping so much I suggested going down on the trazodone.  She denies any thoughts of self-harm or suicide. Visit Diagnosis:    ICD-10-CM   1. Severe episode of recurrent major depressive disorder, without psychotic features ([place])  F33.2     2. GAD (generalized anxiety disorder)  F41.1     3. Attention deficit hyperactivity disorder (ADHD), predominantly inattentive type  F90.0       Past Psychiatric History: Long-term outpatient treatment, hospitalization last year for depression  Past Medical History:  Past Medical History:  Diagnosis Date   ADD (attention deficit disorder)    Anemia 2011   2o to GASTRIC BYPASS   Anginal pain ([place])    Anxiety    Asthma    Blood transfusion without reported diagnosis    Chronic daily headache    Depression    Depression    Dysmenorrhea 09/18/2013   Dysrhythmia    Elevated liver enzymes JUL 2011ALK PHOS 111-127 AST  143-267 ALT  213-321T BILI 0.6  ALB  3.7-4.06 Jun 2011 ALK PHOS 118 AST 24 ALT 42 T BILI 0.4 ALB 3.9   Encounter for  drug rehabilitation    at behavioral health for opioid addiction about 4 years ago   Family history of adverse reaction to anesthesia    'dad had to be kept on pump for breathing for morphine'   Fatty liver    Fibroid 01/18/2017   Fibromyalgia    Gastric bypass status for obesity    Gastritis JULY 2011   Heart murmur    Hereditary and idiopathic peripheral neuropathy 01/15/2015   History of Holter monitoring    Hx of opioid abuse ([place])    for about 4 years, about 4 years ago   Interstitial cystitis    Iron deficiency anemia 07/23/2010   Irritable bowel syndrome 2012 DIARRHEA   JUN 2012 TTG IgA 14.9   IUD FEB 2010   Lupus ([place])    Menorrhagia  07/18/2013   Migraines    Obesity (BMI 30-39.9) 2011 228 LBS BMI 36.8   Osteoporosis    Ovarian cyst    Patient desires pregnancy 09/18/2013   Polyneuropathy    PONV (postoperative nausea and vomiting) seizure post-operatively   seizure following ankle surgery   Potassium (K) deficiency    Pregnant 12/25/2013   Psychiatric pseudoseizure    RLQ abdominal pain 07/18/2013   Sciatica of left side 11/14/2014   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   Stress 09/18/2013    Past Surgical History:  Procedure Laterality Date   BIOPSY  04/10/2015   Procedure: BIOPSY;  Surgeon: Daneil Dolin, MD;  Location: [facility];  Service: Endoscopy;;   BIOPSY  04/15/2021   Procedure: BIOPSY;  Surgeon: Montez Morita, Quillian Quince, MD;  [facility];  Service: Gastroenterology;;   cath self every nite     for sodium bicarb injection (discontinued 2013)   CHOLECYSTECTOMY  2005   biliary dyskinesia   COLONOSCOPY  JUN 2012 ABD PN/DIARRHEA WITH PROPOFOL   NL COLON   COLONOSCOPY WITH PROPOFOL N/A 01/16/2021   Procedure: COLONOSCOPY WITH PROPOFOL;  Surgeon: Harvel Quale, MD;  [facility];  Service: Gastroenterology;  Laterality: N/A;  1:45   DILATION AND CURETTAGE OF UTERUS     DILITATION & CURRETTAGE/HYSTROSCOPY WITH NOVASURE ABLATION N/A 03/24/2017   Procedure: DILATATION & CURETTAGE/HYSTEROSCOPY WITH NOVASURE ENDOMETRIAL ABLATION;  Surgeon: Jonnie Kind, MD;  Location: [facility];  Service: Gynecology;  Laterality: N/A;   ESOPHAGEAL DILATION N/A 04/10/2015   Procedure: ESOPHAGEAL DILATION WITH 54FR MALONEY DILATOR;  Surgeon: Daneil Dolin, MD;  Location: [facility];  Service: Endoscopy;  Laterality: N/A;   ESOPHAGOGASTRODUODENOSCOPY     ESOPHAGOGASTRODUODENOSCOPY (EGD) WITH PROPOFOL N/A 04/10/2015   Procedure: ESOPHAGOGASTRODUODENOSCOPY (EGD) WITH PROPOFOL;  Surgeon: Daneil Dolin, MD;  Location: [facility];  Service: Endoscopy;  Laterality: N/A;   ESOPHAGOGASTRODUODENOSCOPY (EGD) WITH  PROPOFOL N/A 12/06/2016   Procedure: ESOPHAGOGASTRODUODENOSCOPY (EGD) WITH PROPOFOL;  Surgeon: Danie Binder, MD;  [facility];  Service: Endoscopy;  Laterality: N/A;   ESOPHAGOGASTRODUODENOSCOPY (EGD) WITH PROPOFOL N/A 04/27/2019   Procedure: ESOPHAGOGASTRODUODENOSCOPY (EGD) WITH PROPOFOL;  Surgeon: Rogene Houston, MD;  [facility];  Service: Endoscopy;  Laterality: N/A;  730   ESOPHAGOGASTRODUODENOSCOPY (EGD) WITH PROPOFOL N/A 08/31/2019   Procedure: ESOPHAGOGASTRODUODENOSCOPY (EGD) WITH PROPOFOL;  Surgeon: Rogene Houston, MD;  [facility];  Service: Endoscopy;  Laterality: N/A;   ESOPHAGOGASTRODUODENOSCOPY (EGD) WITH PROPOFOL N/A 01/16/2021   Procedure: ESOPHAGOGASTRODUODENOSCOPY (EGD) WITH PROPOFOL;  Surgeon: Harvel Quale, MD;  [facility];  Service: Gastroenterology;  Laterality: N/A;  ESOPHAGOGASTRODUODENOSCOPY (EGD) WITH PROPOFOL N/A 04/15/2021   Procedure: ESOPHAGOGASTRODUODENOSCOPY (EGD) WITH PROPOFOL;  Surgeon: Harvel Quale, MD;  [facility];  Service: Gastroenterology;  Laterality: N/A;  9:00 AM   GAB  2007   in [place]-POUCH 5 CM   GASTRIC BYPASS  06/2006   HYSTEROSCOPY WITH D & C N/A 09/12/2014   Procedure: DILATATION AND CURETTAGE /HYSTEROSCOPY;  Surgeon: Jonnie Kind, MD;  Location: [facility];  Service: Gynecology;  Laterality: N/A;   KYPHOPLASTY N/A 08/02/2018   Procedure: KYPHOPLASTY T12;  Surgeon: Melina Schools, MD;  Location: [place];  Service: Orthopedics;  Laterality: N/A;  60 mins   LAPAROSCOPIC TUBAL LIGATION Bilateral 03/24/2017   Procedure: LAPAROSCOPIC TUBAL LIGATION ([place] Rings);  Surgeon: Jonnie Kind, MD;  Location: [facility];  Service: Gynecology;  Laterality: Bilateral;   REPAIR VAGINAL CUFF N/A 07/30/2014   Procedure: REPAIR VAGINAL CUFF;  Surgeon: Mora Bellman, MD;  Location: [facility];  Service: Gynecology;  Laterality: N/A;   SAVORY DILATION  06/20/2012   Dr. Barnie Alderman  gastritis/Ulcer in the mid jejunum. Empiric dilation.    SURAL NERVE BX Left 02/25/2016   Procedure: LEFT SURAL NERVE BIOPSY;  Surgeon: Jovita Gamma, MD;  Location: [employer] [facility];  Service: Neurosurgery;  Laterality: Left;  Left sural nerve biopsy   TONSILLECTOMY     TONSILLECTOMY AND ADENOIDECTOMY     UPPER GASTROINTESTINAL ENDOSCOPY  JULY 2011 NAUSEA-D125,V6, PH 25   Bx; GASTRITIS, POUCH-5 CM LONG   WISDOM TOOTH EXTRACTION      Family Psychiatric History: See below  Family History:  Family History  Problem Relation Age of Onset   Coronary artery disease Paternal Grandfather    Migraines Paternal Grandmother    Breast cancer Paternal Grandmother    Hemochromatosis Maternal Grandmother    Migraines Maternal Grandmother    Cancer Maternal Grandmother    Breast cancer Maternal Grandmother    Hypertension Father    Diabetes Father    Coronary artery disease Father    Cancer Mother        breast   Hemochromatosis Mother    Breast cancer Mother    Depression Mother    Anxiety disorder Mother    Anxiety disorder Brother    Bipolar disorder Brother    Healthy Daughter    Healthy Son     Social History:  Social History   Socioeconomic History   Marital status: Divorced    Spouse name: Not on file   Number of children: 2   Years of education: some college   Highest education level: Some college, no degree  Occupational History   Occupation: Applying for Social Security Disability    Employer: NOT EMPLOYED  Tobacco Use   Smoking status: Former    Packs/day: 0.25    Years: 6.00    Total pack years: 1.50    Types: Cigarettes    Start date: 05/2018   Smokeless tobacco: Never  Vaping Use   Vaping Use: Some days  Substance and Sexual Activity   Alcohol use: Not Currently    Alcohol/week: 0.0 standard drinks of alcohol   Drug use: Not Currently   Sexual activity: Yes    Partners: Male    Birth control/protection: Surgical, Condom    Comment: tubal and ablation   Other Topics Concern   Not on file  Social History Narrative   Patient is right handed.   Patient drinks one cup of coffee daily.   She lives in a one-story  home with her two children (39 year old son, 64 month old daughter).   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 [employer]: Medium Risk (08/25/2021)   Overall Financial Resource Strain (CARDIA)    Difficulty of Paying Living Expenses: Somewhat hard  Food Insecurity: Food Insecurity Present (08/25/2021)   Hunger Vital Sign    Worried About [place] in the Last Year: Sometimes true    Ran Out of Food in the Last Year: Sometimes true  Transportation Needs: No Transportation Needs (08/25/2021)   PRAPARE - Hydrologist (Medical): No    Lack of Transportation (Non-Medical): No  Physical Activity: Inactive (08/25/2021)   Exercise Vital Sign    Days of Exercise per Week: 0 days    Minutes of Exercise per Session: 10 min  Stress: Stress Concern Present (08/25/2021)   [place]    Feeling of Stress : Very much  Social Connections: Moderately Isolated (08/25/2021)   Social Connection and Isolation Panel [NHANES]    Frequency of Communication with Friends and Family: More than three times a week    Frequency of Social Gatherings with Friends and Family: Once a week    Attends Religious Services: 1 to 4 times per year    Active Member of [employer] or Organizations: No    Attends Archivist Meetings: Never    Marital Status: Divorced    Allergies:  Allergies  Allergen Reactions   Gabapentin Other (See Comments)    Pt states that she was unresponsive after taking this medication, but her vitals remained stable.     Metoclopramide Hcl Anxiety and Other (See Comments)    Pt states that she felt like she was trapped in a box, and could  not get out.  Pt also states that she had temporary loss of movement, weakness, and tingling.     Tramadol Other (See Comments)    Seizures   Ativan [Lorazepam] Other (See Comments)    combative   Latex Itching, Rash and Other (See Comments)    B[place]   Lyrica [Pregabalin] Other (See Comments)    suicidal   Iron Other (See Comments)    PATIENT IS INTOLERANT TO ORAL IRON , SHE DOES NOT ABSORB IT.     Sulfa Antibiotics Hives   Butrans [Buprenorphine] Rash    Patch caused rash, didn't help with pain   Nucynta [Tapentadol] Nausea And Vomiting   Propofol Nausea And Vomiting   Tape Hives, Itching and Rash    Please use paper tape   Toradol [Ketorolac Tromethamine] Anxiety    Metabolic Disorder Labs: Lab Results  Component Value Date   HGBA1C 5.7 (H) 01/15/2015   Lab Results  Component Value Date   PROLACTIN 80.0 (H) 05/09/2015   Lab Results  Component Value Date   CHOL 95 03/10/2012   TRIG 50 03/10/2012   HDL 38 (L) 03/10/2012   CHOLHDL 2.5 03/10/2012   VLDL 10 03/10/2012   LDLCALC 47 03/10/2012   Lab Results  Component Value Date   TSH 1.350 01/01/2021   TSH 2.616 08/25/2019    Therapeutic Level Labs: No results found for: "LITHIUM" Lab Results  Component Value Date   VALPROATE 66 06/23/2020   Lab Results  Component Value Date   CBMZ 8.0 06/14/2017   CBMZ 6.8 02/24/2016  Current Medications: Current Outpatient Medications  Medication Sig Dispense Refill   Dexmethylphenidate HCl 30 MG CP24 Take 1 capsule (30 mg total) by mouth every morning. 30 capsule 0   albuterol (VENTOLIN HFA) 108 (90 Base) MCG/ACT inhaler Inhale into the lungs every 6 (six) hours as needed for wheezing or shortness of breath.     buprenorphine (SUBUTEX) 8 MG SUBL SL tablet Place 4 mg under the tongue 2 (two) times daily.     Calcium Carb-Cholecalciferol (CALCIUM-VITAMIN D3) 600-500 MG-UNIT CAPS Take 1 tablet by mouth daily.     cefdinir (OMNICEF) 300 MG capsule Take 1 capsule (300 mg  total) by mouth 2 (two) times daily. 14 capsule 0   cetirizine (ZYRTEC) 10 MG tablet TAKE ONE TABLET BY MOUTH DAILY 30 tablet 0   clidinium-chlordiazePOXIDE (LIBRAX) 5-2.5 MG capsule TAKE (1) CAPSULE BY MOUTH 3 TIMES DAILY BEFORE MEALS. 90 capsule 0   clonazePAM (KLONOPIN) 0.5 MG tablet Take 1 tablet (0.5 mg total) by mouth 2 (two) times daily as needed. 30 tablet 2   cyclobenzaprine (FLEXERIL) 10 MG tablet Take 10 mg by mouth 3 (three) times daily.     DULoxetine (CYMBALTA) 60 MG capsule Take 1 capsule (60 mg total) by mouth 2 (two) times daily. 180 capsule 2   Erenumab-aooe (AIMOVIG) 140 MG/ML SOAJ Inject 140 mg into the skin every 28 (twenty-eight) days. 1 mL 11   fluconazole (DIFLUCAN) 150 MG tablet Take 1 tablet (150 mg total) by mouth once a week. (Patient not taking: Reported on 07/09/2022) 2 tablet 0   fluticasone (FLOVENT HFA) 220 MCG/ACT inhaler Inhale 2 puffs into the lungs 2 (two) times daily. Do not inhale, swallow powder and drink water , do not eat food for 20 minutes after puffs 4 each 2   HYDROmorphone (DILAUDID) 4 MG tablet Take 4 mg by mouth every 4 (four) hours as needed.     levalbuterol (XOPENEX HFA) 45 MCG/ACT inhaler INHALE (2) PUFFS INTO THE LUNGS EVERY SIX HOURS AS NEEDED FOR WHEEZING. 15 g 3   levalbuterol (XOPENEX) 1.25 MG/3ML nebulizer solution Take 1.25 mg by nebulization every 4 (four) hours as needed for wheezing. 50 mL 3   linaclotide (LINZESS) 290 MCG CAPS capsule Take 1 capsule (290 mcg total) by mouth daily before breakfast. 90 capsule 3   ondansetron (ZOFRAN-ODT) 8 MG disintegrating tablet Take 1 tablet (8 mg total) by mouth every 8 (eight) hours as needed for nausea or vomiting. (Patient not taking: Reported on 07/09/2022) 15 tablet 5   pantoprazole (PROTONIX) 40 MG tablet Take 1 tablet (40 mg total) by mouth daily. 30 tablet 5   promethazine (PHENERGAN) 12.5 MG tablet TAKE 1 TABLET BY MOUTH EVERY 8 HOURS AS NEEDED FOR NAUSEA OR VOMITING (Patient not taking: Reported  on 07/09/2022) 30 tablet 0   promethazine (PHENERGAN) 25 MG tablet TAKE (1) TABLET BY MOUTH TWICE A DAY AS NEEDED. (Patient not taking: Reported on 07/09/2022) 30 tablet 2   Rimegepant Sulfate (NURTEC) 75 MG TBDP Take 1 tablet as needed for severe migraine.  Limit to twice per week. 10 tablet 3   traZODone (DESYREL) 100 MG tablet Take 2 tablets (200 mg total) by mouth at bedtime. 60 tablet 2   valACYclovir (VALTREX) 1000 MG tablet TAKE 1 TABLET BY MOUTH DAILY. 30 tablet PRN   Vitamin D, Ergocalciferol, (DRISDOL) 1.25 MG (50000 UNIT) CAPS capsule Take 50,000 Units by mouth every Wednesday.     No current facility-administered medications for this visit.  Musculoskeletal: Strength & Muscle Tone: na Gait & Station: na Patient leans: N/A  Psychiatric Specialty Exam: Review of Systems  Constitutional:  Positive for fatigue.  Musculoskeletal:  Positive for back pain.  Psychiatric/Behavioral:  Positive for decreased concentration and dysphoric mood.   All other systems reviewed and are negative.   There were no vitals taken for this visit.There is no height or weight on file to calculate BMI.  General Appearance: NA  Eye Contact:  NA  Speech:  Clear and Coherent  Volume:  Normal  Mood:  Dysphoric  Affect:  NA  Thought Process:  Goal Directed  Orientation:  Full (Time, Place, and Person)  Thought Content: Rumination   Suicidal Thoughts:  No  Homicidal Thoughts:  No  Memory:  Immediate;   Good Recent;   Good Remote;   Good  Judgement:  Good  Insight:  Fair  Psychomotor Activity:  Decreased  Concentration:  Concentration: Fair and Attention Span: Fair  Recall:  Good  Fund of Knowledge: Good  Language: Good  Akathisia:  No  Handed:  Right  AIMS (if indicated): not done  Assets:  Communication Skills Desire for Improvement Resilience Social Support  ADL's:  Intact  Cognition: WNL  Sleep:  Good   Screenings: AIMS    Flowsheet Row Admission (Discharged) from 06/19/2020 in  [facility]  AIMS Total Score 0      AUDIT    Flowsheet Row Patient Outreach Telephone from 07/14/2020 in [facility] Coordination Admission (Discharged) from 06/19/2020 in [facility] ED to Hosp-Admission (Discharged) from 05/09/2015 in [facility]  Alcohol Use Disorder Identification Test Final Score (AUDIT) 1 1 0      GAD-7    Flowsheet Row Office Visit from 08/25/2021 in [facility] Counselor from 12/11/2018 in [facility] Counselor from 02/07/2017 in [facility]  Total GAD-7 Score $RemoveBef'13 19 17      'cXwDrmARfE$ PHQ2-9    Flowsheet Row Video Visit from 07/22/2022 in [facility] Office Visit from 06/29/2022 in [facility] Video Visit from 04/28/2022 in [facility] Video Visit from 02/18/2022 in [facility] Counselor from 12/17/2021 in [facility]  PHQ-2 Total Score $RemoveBef'1 3 1 2 6  'ghrtNJucMj$ PHQ-9 Total Score -- 18 -- 8 20      Flowsheet Row Video Visit from 07/22/2022 in [facility] Video Visit from 04/28/2022 in [facility] ED from 03/08/2022 in [facility] No Risk No Risk No Risk        Assessment and Plan: This patient is a 36 year old female with a history of anxiety depression ADHD pseudoseizures borderline personality disorder and insomnia.  She is not very happy with the Adderall XR and she thinks it makes her more depressed so we will switch to Focalin XR 30 mg every morning for ADD.  For now she will continue Cymbalta 120 mg daily for depression, clonazepam 0.5 mg twice daily as needed for anxiety.  She had  been taking trazodone up to 200 mg but I suggested going back down to 100 mg as she is sleeping too much.  She will return to see me in 4 weeks  Collaboration of Care: Collaboration of Care: Primary Care Provider AEB notes are shared with PCP on the epic  system  Patient/Guardian was advised Release of Information must be obtained prior to any record release in order to collaborate their care with an outside provider. Patient/Guardian was advised if they have not already done so to contact the registration department to sign all necessary forms in order for [name] to release information regarding their care.   Consent: Patient/Guardian gives verbal consent for treatment and assignment of benefits for services provided during this visit. Patient/Guardian expressed understanding and agreed to proceed.    Penny Spiller, MD 07/22/2022, 3:38 PM

## 2022-07-26 ENCOUNTER — Inpatient Hospital Stay: Payer: Medicaid Other

## 2022-08-05 DIAGNOSIS — F32A Depression, unspecified: Secondary | ICD-10-CM | POA: Diagnosis not present

## 2022-08-05 DIAGNOSIS — Z79899 Other long term (current) drug therapy: Secondary | ICD-10-CM | POA: Diagnosis not present

## 2022-08-05 DIAGNOSIS — M545 Low back pain, unspecified: Secondary | ICD-10-CM | POA: Diagnosis not present

## 2022-08-05 DIAGNOSIS — G40909 Epilepsy, unspecified, not intractable, without status epilepticus: Secondary | ICD-10-CM | POA: Diagnosis not present

## 2022-08-06 ENCOUNTER — Inpatient Hospital Stay: Payer: Medicaid Other | Attending: Hematology

## 2022-08-10 DIAGNOSIS — Z79899 Other long term (current) drug therapy: Secondary | ICD-10-CM | POA: Diagnosis not present

## 2022-08-12 ENCOUNTER — Telehealth (HOSPITAL_COMMUNITY): Payer: Medicaid Other | Admitting: Psychiatry

## 2022-08-16 ENCOUNTER — Other Ambulatory Visit: Payer: Self-pay | Admitting: Family Medicine

## 2022-08-16 ENCOUNTER — Other Ambulatory Visit (INDEPENDENT_AMBULATORY_CARE_PROVIDER_SITE_OTHER): Payer: Self-pay | Admitting: Gastroenterology

## 2022-08-16 DIAGNOSIS — K589 Irritable bowel syndrome without diarrhea: Secondary | ICD-10-CM

## 2022-08-16 DIAGNOSIS — R32 Unspecified urinary incontinence: Secondary | ICD-10-CM | POA: Diagnosis not present

## 2022-08-16 DIAGNOSIS — N319 Neuromuscular dysfunction of bladder, unspecified: Secondary | ICD-10-CM | POA: Diagnosis not present

## 2022-08-18 ENCOUNTER — Telehealth: Payer: Self-pay | Admitting: Pharmacy Technician

## 2022-08-18 ENCOUNTER — Other Ambulatory Visit (HOSPITAL_COMMUNITY): Payer: Self-pay

## 2022-08-18 ENCOUNTER — Encounter: Payer: Self-pay | Admitting: Hematology

## 2022-08-18 NOTE — Telephone Encounter (Signed)
Patient Advocate Encounter  Prior Authorization for Aimovig '140MG'$ /ML auto-injectors has been approved.    PA# [id number] Key: Inocente Salles Effective dates: 08/18/2022 through 08/18/2023      Lyndel Safe, [place] Patient Advocate Specialist [name] Patient Advocate Team Direct Number: [phone]  Fax: [phone]

## 2022-08-24 ENCOUNTER — Other Ambulatory Visit (HOSPITAL_COMMUNITY): Payer: Self-pay | Admitting: Psychiatry

## 2022-08-24 ENCOUNTER — Telehealth (HOSPITAL_COMMUNITY): Payer: Self-pay | Admitting: *Deleted

## 2022-08-24 MED ORDER — ADDERALL XR 30 MG PO CP24: ORAL_CAPSULE | ORAL | 0 refills | Status: DC

## 2022-08-24 NOTE — Telephone Encounter (Signed)
Patient called and stated that the ADHD medication that provider just put her on is not a good fit for her. Per pt it has caused her to be depressed and down and sad. Per pt she just want to go back to the Adderall. Per pt she is working with her Therapist on this as well.

## 2022-08-24 NOTE — Telephone Encounter (Signed)
sent 

## 2022-08-25 ENCOUNTER — Telehealth (HOSPITAL_COMMUNITY): Payer: Self-pay

## 2022-08-25 NOTE — Telephone Encounter (Signed)
Okay, thanks

## 2022-08-25 NOTE — Telephone Encounter (Signed)
Pt wanted to let Dr. Harrington Challenger know she picked up her medication and is speaking with her therapist this morning.

## 2022-08-27 ENCOUNTER — Telehealth: Payer: Self-pay

## 2022-08-27 ENCOUNTER — Other Ambulatory Visit (HOSPITAL_COMMUNITY): Payer: Self-pay

## 2022-08-27 NOTE — Telephone Encounter (Signed)
Received notification that prior authorization renewal is required for Nurtec '75MG'$  dispersible tablets.  PA submitted and APPROVED on 08-27-2022.  Key [id number] Effective: 08-27-2022 - 08-27-2023

## 2022-09-02 ENCOUNTER — Encounter: Payer: Self-pay | Admitting: Family Medicine

## 2022-09-02 NOTE — Telephone Encounter (Signed)
May have consult with either neurosurgical spine in [facility] or with [facility] in [place]  Unfortunately none of these doctors are able to see people on a stat basis often it scheduled and can sometimes be weeks out  If having significant loss of feeling in muscle strength in the legs and being bedridden emergency department visit for further evaluation would be necessary  If it is more of intermittent numbness and intermittent weakness intermittent pain discomfort etc. it would be wise to do a follow-up visit with [name] in addition to the referral

## 2022-09-03 DIAGNOSIS — M546 Pain in thoracic spine: Secondary | ICD-10-CM | POA: Diagnosis not present

## 2022-09-03 DIAGNOSIS — R030 Elevated blood-pressure reading, without diagnosis of hypertension: Secondary | ICD-10-CM | POA: Diagnosis not present

## 2022-09-03 DIAGNOSIS — Z79899 Other long term (current) drug therapy: Secondary | ICD-10-CM | POA: Diagnosis not present

## 2022-09-03 DIAGNOSIS — M545 Low back pain, unspecified: Secondary | ICD-10-CM | POA: Diagnosis not present

## 2022-09-09 DIAGNOSIS — Z79899 Other long term (current) drug therapy: Secondary | ICD-10-CM | POA: Diagnosis not present

## 2022-09-14 ENCOUNTER — Other Ambulatory Visit: Payer: Self-pay | Admitting: Neurology

## 2022-09-14 ENCOUNTER — Other Ambulatory Visit: Payer: Self-pay | Admitting: Family Medicine

## 2022-09-17 ENCOUNTER — Other Ambulatory Visit (HOSPITAL_COMMUNITY): Payer: Self-pay | Admitting: Psychiatry

## 2022-09-17 ENCOUNTER — Telehealth (HOSPITAL_COMMUNITY): Payer: Self-pay | Admitting: *Deleted

## 2022-09-17 MED ORDER — CLONAZEPAM 0.5 MG PO TABS: 0.5000 mg | ORAL_TABLET | Freq: Two times a day (BID) | ORAL | 2 refills | Status: DC | PRN

## 2022-09-17 NOTE — Telephone Encounter (Signed)
Patient called stating she would like for provider to please send in another script for her Clonazepam. Per pt the wrong quantity was sent into the pharmacy.   Per pt, the quantity that was sent in was 30 and it's suppose to be 60. Per pt she takes it BID

## 2022-09-17 NOTE — Telephone Encounter (Signed)
sent 

## 2022-09-17 NOTE — Telephone Encounter (Signed)
Informed patient and she verbalized understanding.  

## 2022-09-20 ENCOUNTER — Telehealth (INDEPENDENT_AMBULATORY_CARE_PROVIDER_SITE_OTHER): Payer: Medicaid Other | Admitting: Psychiatry

## 2022-09-20 ENCOUNTER — Encounter (HOSPITAL_COMMUNITY): Payer: Self-pay | Admitting: Psychiatry

## 2022-09-20 DIAGNOSIS — F900 Attention-deficit hyperactivity disorder, predominantly inattentive type: Secondary | ICD-10-CM

## 2022-09-20 DIAGNOSIS — F332 Major depressive disorder, recurrent severe without psychotic features: Secondary | ICD-10-CM | POA: Diagnosis not present

## 2022-09-20 DIAGNOSIS — F411 Generalized anxiety disorder: Secondary | ICD-10-CM | POA: Diagnosis not present

## 2022-09-20 MED ORDER — ADDERALL XR 30 MG PO CP24: ORAL_CAPSULE | ORAL | 0 refills | Status: DC

## 2022-09-20 MED ORDER — CLONAZEPAM 0.5 MG PO TABS: 0.5000 mg | ORAL_TABLET | Freq: Two times a day (BID) | ORAL | 2 refills | Status: DC | PRN

## 2022-09-20 MED ORDER — DULOXETINE HCL 60 MG PO CPEP: 60.0000 mg | ORAL_CAPSULE | Freq: Two times a day (BID) | ORAL | 2 refills | Status: DC

## 2022-09-20 MED ORDER — TRAZODONE HCL 100 MG PO TABS: 200.0000 mg | ORAL_TABLET | Freq: Every day | ORAL | 2 refills | Status: DC

## 2022-09-20 NOTE — Progress Notes (Signed)
Virtual Visit via Telephone Note  I connected with Penny [name] on 09/20/22 at 10:40 AM EDT by telephone and verified that I am speaking with the correct person using two identifiers.  Location: Patient: home Provider: office   I discussed the limitations, risks, security and privacy concerns of performing an evaluation and management service by telephone and the availability of in person appointments. I also discussed with the patient that there may be a patient responsible charge related to this service. The patient expressed understanding and agreed to proceed.      I discussed the assessment and treatment plan with the patient. The patient was provided an opportunity to ask questions and all were answered. The patient agreed with the plan and demonstrated an understanding of the instructions.   The patient was advised to call back or seek an in-person evaluation if the symptoms worsen or if the condition fails to improve as anticipated.  I provided 15 minutes of non-face-to-face time during this encounter.   Penny Spiller, MD  [facility] MD/PA/NP OP Progress Note  09/20/2022 11:04 AM Penny [name]  MRN:  [id number]  Chief Complaint:  Chief Complaint  Patient presents with   Depression   Anxiety   Follow-up   HPI: This patient is a 36 year old female who lives alone in [place].  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  She has 2 children with whom she shares custody with her father's.  The patient returns for follow-up after 2 months.  She states that she is sad today.  She spent the weekend without her children and this always makes her feel down.  She states one of the children's fathers is going to contest the custody arrangement and this makes her worried.  She also got taken off pain medication because her drug screen was positive for THC.  She now can only take Celebrex.  She states that the pain is variable.  She also goes to chiropractic care.  She is somewhat  depressed about all of this but is trying to stay positive for the children.  Most of the time she is staying focused.  She claims that she is eating. Visit Diagnosis:    ICD-10-CM   1. GAD (generalized anxiety disorder)  F41.1     2. Attention deficit hyperactivity disorder (ADHD), predominantly inattentive type  F90.0     3. Major depressive disorder, recurrent episode, 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[place])  F33.2       Past Psychiatric History: Long-term outpatient treatment, hospitalization last year for depression  Past Medical History:  Past Medical History:  Diagnosis Date   ADD (attention deficit disorder)    Anemia 2011   2o to GASTRIC BYPASS   Anginal pain ([place])    Anxiety    Asthma    Blood transfusion without reported diagnosis    Chronic daily headache    Depression    Depression    Dysmenorrhea 09/18/2013   Dysrhythmia    Elevated liver enzymes JUL 2011ALK PHOS 111-127 AST  143-267 ALT  213-321T BILI 0.6  ALB  3.7-4.06 Jun 2011 ALK PHOS 118 AST 24 ALT 42 T BILI 0.4 ALB 3.9   Encounter for drug rehabilitation    at behavioral health for opioid addiction about 4 years ago   Family history of adverse reaction to anesthesia    'dad had to be kept on pump for breathing for morphine'   Fatty liver    Fibroid 01/18/2017  Fibromyalgia    Gastric bypass status for obesity    Gastritis JULY 2011   Heart murmur    Hereditary and idiopathic peripheral neuropathy 01/15/2015   History of Holter monitoring    Hx of opioid abuse ([place])    for about 4 years, about 4 years ago   Interstitial cystitis    Iron deficiency anemia 07/23/2010   Irritable bowel syndrome 2012 DIARRHEA   JUN 2012 TTG IgA 14.9   IUD FEB 2010   Lupus ([place])    Menorrhagia 07/18/2013   Migraines    Obesity (BMI 30-39.9) 2011 228 LBS BMI 36.8   Osteoporosis    Ovarian cyst    Patient desires pregnancy 09/18/2013   Polyneuropathy    PONV (postoperative nausea and vomiting) seizure post-operatively    seizure following ankle surgery   Potassium (K) deficiency    Pregnant 12/25/2013   Psychiatric pseudoseizure    RLQ abdominal pain 07/18/2013   Sciatica of left side 11/14/2014   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   Stress 09/18/2013    Past Surgical History:  Procedure Laterality Date   BIOPSY  04/10/2015   Procedure: BIOPSY;  Surgeon: Daneil Dolin, MD;  Location: [facility];  Service: Endoscopy;;   BIOPSY  04/15/2021   Procedure: BIOPSY;  Surgeon: Montez Morita, Quillian Quince, MD;  [facility];  Service: Gastroenterology;;   cath self every nite     for sodium bicarb injection (discontinued 2013)   CHOLECYSTECTOMY  2005   biliary dyskinesia   COLONOSCOPY  JUN 2012 ABD PN/DIARRHEA WITH PROPOFOL   NL COLON   COLONOSCOPY WITH PROPOFOL N/A 01/16/2021   Procedure: COLONOSCOPY WITH PROPOFOL;  Surgeon: Harvel Quale, MD;  [facility];  Service: Gastroenterology;  Laterality: N/A;  1:45   DILATION AND CURETTAGE OF UTERUS     DILITATION & CURRETTAGE/HYSTROSCOPY WITH NOVASURE ABLATION N/A 03/24/2017   Procedure: DILATATION & CURETTAGE/HYSTEROSCOPY WITH NOVASURE ENDOMETRIAL ABLATION;  Surgeon: Jonnie Kind, MD;  Location: [facility];  Service: Gynecology;  Laterality: N/A;   ESOPHAGEAL DILATION N/A 04/10/2015   Procedure: ESOPHAGEAL DILATION WITH 54FR MALONEY DILATOR;  Surgeon: Daneil Dolin, MD;  Location: [facility];  Service: Endoscopy;  Laterality: N/A;   ESOPHAGOGASTRODUODENOSCOPY     ESOPHAGOGASTRODUODENOSCOPY (EGD) WITH PROPOFOL N/A 04/10/2015   Procedure: ESOPHAGOGASTRODUODENOSCOPY (EGD) WITH PROPOFOL;  Surgeon: Daneil Dolin, MD;  [facility];  Service: Endoscopy;  Laterality: N/A;   ESOPHAGOGASTRODUODENOSCOPY (EGD) WITH PROPOFOL N/A 12/06/2016   Procedure: ESOPHAGOGASTRODUODENOSCOPY (EGD) WITH PROPOFOL;  Surgeon: Danie Binder, MD;  [facility];  Service: Endoscopy;  Laterality: N/A;   ESOPHAGOGASTRODUODENOSCOPY (EGD) WITH PROPOFOL N/A  04/27/2019   Procedure: ESOPHAGOGASTRODUODENOSCOPY (EGD) WITH PROPOFOL;  Surgeon: Rogene Houston, MD;  [facility];  Service: Endoscopy;  Laterality: N/A;  730   ESOPHAGOGASTRODUODENOSCOPY (EGD) WITH PROPOFOL N/A 08/31/2019   Procedure: ESOPHAGOGASTRODUODENOSCOPY (EGD) WITH PROPOFOL;  Surgeon: Rogene Houston, MD;  [facility];  Service: Endoscopy;  Laterality: N/A;   ESOPHAGOGASTRODUODENOSCOPY (EGD) WITH PROPOFOL N/A 01/16/2021   Procedure: ESOPHAGOGASTRODUODENOSCOPY (EGD) WITH PROPOFOL;  Surgeon: Harvel Quale, MD;  [facility];  Service: Gastroenterology;  Laterality: N/A;   ESOPHAGOGASTRODUODENOSCOPY (EGD) WITH PROPOFOL N/A 04/15/2021   Procedure: ESOPHAGOGASTRODUODENOSCOPY (EGD) WITH PROPOFOL;  Surgeon: Harvel Quale, MD;  [facility];  Service: Gastroenterology;  Laterality: N/A;  9:00 AM   GAB  2007   in [place]-POUCH 5 CM   GASTRIC  BYPASS  06/2006   HYSTEROSCOPY WITH D & C N/A 09/12/2014   Procedure: DILATATION AND CURETTAGE /HYSTEROSCOPY;  Surgeon: Jonnie Kind, MD;  Location: [facility];  Service: Gynecology;  Laterality: N/A;   KYPHOPLASTY N/A 08/02/2018   Procedure: KYPHOPLASTY T12;  Surgeon: Melina Schools, MD;  Location: [place];  Service: Orthopedics;  Laterality: N/A;  60 mins   LAPAROSCOPIC TUBAL LIGATION Bilateral 03/24/2017   Procedure: LAPAROSCOPIC TUBAL LIGATION ([place] Rings);  Surgeon: Jonnie Kind, MD;  Location: [facility];  Service: Gynecology;  Laterality: Bilateral;   REPAIR VAGINAL CUFF N/A 07/30/2014   Procedure: REPAIR VAGINAL CUFF;  Surgeon: Mora Bellman, MD;  Location: [facility];  Service: Gynecology;  Laterality: N/A;   SAVORY DILATION  06/20/2012   Dr. Barnie Alderman gastritis/Ulcer in the mid jejunum. Empiric dilation.    SURAL NERVE BX Left 02/25/2016   Procedure: LEFT SURAL NERVE BIOPSY;  Surgeon: Jovita Gamma, MD;  Location: [facility];  Service: Neurosurgery;  Laterality: Left;  Left sural  nerve biopsy   TONSILLECTOMY     TONSILLECTOMY AND ADENOIDECTOMY     UPPER GASTROINTESTINAL ENDOSCOPY  JULY 2011 NAUSEA-D125,V6, PH 25   Bx; GASTRITIS, POUCH-5 CM LONG   WISDOM TOOTH EXTRACTION      Family Psychiatric History: See below  Family History:  Family History  Problem Relation Age of Onset   Coronary artery disease Paternal Grandfather    Migraines Paternal Grandmother    Breast cancer Paternal Grandmother    Hemochromatosis Maternal Grandmother    Migraines Maternal Grandmother    Cancer Maternal Grandmother    Breast cancer Maternal Grandmother    Hypertension Father    Diabetes Father    Coronary artery disease Father    Cancer Mother        breast   Hemochromatosis Mother    Breast cancer Mother    Depression Mother    Anxiety disorder Mother    Anxiety disorder Brother    Bipolar disorder Brother    Healthy Daughter    Healthy Son     Social History:  Social History   Socioeconomic History   Marital status: Divorced    Spouse name: Not on file   Number of children: 2   Years of education: some college   Highest education level: Some college, no degree  Occupational History   Occupation: Applying for Social Security Disability    Employer: NOT EMPLOYED  Tobacco Use   Smoking status: Former    Packs/day: 0.25    Years: 6.00    Total pack years: 1.50    Types: Cigarettes    Start date: 05/2018   Smokeless tobacco: Never  Vaping Use   Vaping Use: Some days  Substance and Sexual Activity   Alcohol use: Not Currently    Alcohol/week: 0.0 standard drinks of alcohol   Drug use: Not Currently   Sexual activity: Yes    Partners: Male    Birth control/protection: Surgical, Condom    Comment: tubal and ablation  Other Topics Concern   Not on file  Social History Narrative   Patient is right handed.   Patient drinks one cup of coffee daily.   She lives in a one-story home with her two children (10 year old son, 66 month old daughter).   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 [employer]: Medium Risk (08/25/2021)  Overall Financial Resource Strain (CARDIA)    Difficulty of Paying Living Expenses: Somewhat hard  Food Insecurity: Food Insecurity Present (08/25/2021)   Hunger Vital Sign    Worried About Running Out of Food in the Last Year: Sometimes true    Ran Out of Food in the Last Year: Sometimes true  Transportation Needs: No Transportation Needs (08/25/2021)   PRAPARE - Hydrologist (Medical): No    Lack of Transportation (Non-Medical): No  Physical Activity: Inactive (08/25/2021)   Exercise Vital Sign    Days of Exercise per Week: 0 days    Minutes of Exercise per Session: 10 min  Stress: Stress Concern Present (08/25/2021)   [place]    Feeling of Stress : Very much  Social Connections: Moderately Isolated (08/25/2021)   Social Connection and Isolation Panel [NHANES]    Frequency of Communication with Friends and Family: More than three times a week    Frequency of Social Gatherings with Friends and Family: Once a week    Attends Religious Services: 1 to 4 times per year    Active Member of [employer] or Organizations: No    Attends Archivist Meetings: Never    Marital Status: Divorced    Allergies:  Allergies  Allergen Reactions   Gabapentin Other (See Comments)    Pt states that she was unresponsive after taking this medication, but her vitals remained stable.     Metoclopramide Hcl Anxiety and Other (See Comments)    Pt states that she felt like she was trapped in a box, and could not get out.  Pt also states that she had temporary loss of movement, weakness, and tingling.     Tramadol Other (See Comments)    Seizures   Ativan [Lorazepam] Other (See Comments)    combative   Latex Itching, Rash and Other (See  Comments)    Burns   Lyrica [Pregabalin] Other (See Comments)    suicidal   Iron Other (See Comments)    PATIENT IS INTOLERANT TO ORAL IRON , SHE DOES NOT ABSORB IT.     Sulfa Antibiotics Hives   Butrans [Buprenorphine] Rash    Patch caused rash, didn't help with pain   Nucynta [Tapentadol] Nausea And Vomiting   Propofol Nausea And Vomiting   Tape Hives, Itching and Rash    Please use paper tape   Toradol [Ketorolac Tromethamine] Anxiety    Metabolic Disorder Labs: Lab Results  Component Value Date   HGBA1C 5.7 (H) 01/15/2015   Lab Results  Component Value Date   PROLACTIN 80.0 (H) 05/09/2015   Lab Results  Component Value Date   CHOL 95 03/10/2012   TRIG 50 03/10/2012   HDL 38 (L) 03/10/2012   CHOLHDL 2.5 03/10/2012   VLDL 10 03/10/2012   LDLCALC 47 03/10/2012   Lab Results  Component Value Date   TSH 1.350 01/01/2021   TSH 2.616 08/25/2019    Therapeutic Level Labs: No results found for: "LITHIUM" Lab Results  Component Value Date   VALPROATE 66 06/23/2020   Lab Results  Component Value Date   CBMZ 8.0 06/14/2017   CBMZ 6.8 02/24/2016    Current Medications: Current Outpatient Medications  Medication Sig Dispense Refill   ADDERALL XR 30 MG 24 hr capsule TAKE 1 CAPSULE BY MOUTH ONCE EVERY MORNING. 30 capsule 0   albuterol (VENTOLIN HFA) 108 (90 Base) MCG/ACT inhaler Inhale into the lungs every 6 (  six) hours as needed for wheezing or shortness of breath.     Calcium Carb-Cholecalciferol (CALCIUM-VITAMIN D3) 600-500 MG-UNIT CAPS Take 1 tablet by mouth daily.     cefdinir (OMNICEF) 300 MG capsule Take 1 capsule (300 mg total) by mouth 2 (two) times daily. 14 capsule 0   celecoxib (CELEBREX) 50 MG capsule Take by mouth.     cetirizine (ZYRTEC) 10 MG tablet TAKE 1 TABLET BY MOUTH ONCE DAILY 30 tablet 0   clidinium-chlordiazePOXIDE (LIBRAX) 5-2.5 MG capsule TAKE (1) CAPSULE BY MOUTH 3 TIMES DAILY BEFORE MEALS. 90 capsule 0   clonazePAM (KLONOPIN) 0.5 MG  tablet Take 1 tablet (0.5 mg total) by mouth 2 (two) times daily as needed. 60 tablet 2   cyclobenzaprine (FLEXERIL) 10 MG tablet Take 10 mg by mouth 3 (three) times daily.     DULoxetine (CYMBALTA) 60 MG capsule Take 1 capsule (60 mg total) by mouth 2 (two) times daily. 180 capsule 2   Erenumab-aooe (AIMOVIG) 140 MG/ML SOAJ Inject 140 mg into the skin every 28 (twenty-eight) days. 1 mL 11   fluticasone (FLOVENT HFA) 220 MCG/ACT inhaler Inhale 2 puffs into the lungs 2 (two) times daily. Do not inhale, swallow powder and drink water , do not eat food for 20 minutes after puffs 4 each 2   levalbuterol (XOPENEX HFA) 45 MCG/ACT inhaler INHALE (2) PUFFS INTO THE LUNGS EVERY SIX HOURS AS NEEDED FOR WHEEZING. 15 g 3   levalbuterol (XOPENEX) 1.25 MG/3ML nebulizer solution Take 1.25 mg by nebulization every 4 (four) hours as needed for wheezing. 50 mL 3   linaclotide (LINZESS) 290 MCG CAPS capsule Take 1 capsule (290 mcg total) by mouth daily before breakfast. 90 capsule 3   pantoprazole (PROTONIX) 40 MG tablet Take 1 tablet (40 mg total) by mouth daily. 30 tablet 5   Rimegepant Sulfate (NURTEC) 75 MG TBDP Take 1 tablet as needed for severe migraine.  Limit to twice per week. 10 tablet 3   traZODone (DESYREL) 100 MG tablet Take 2 tablets (200 mg total) by mouth at bedtime. 60 tablet 2   valACYclovir (VALTREX) 1000 MG tablet TAKE 1 TABLET BY MOUTH DAILY. 30 tablet PRN   Vitamin D, Ergocalciferol, (DRISDOL) 1.25 MG (50000 UNIT) CAPS capsule Take 50,000 Units by mouth every Wednesday.     No current facility-administered medications for this visit.     Musculoskeletal: Strength & Muscle Tone: na Gait & Station: na Patient leans: N/A  Psychiatric Specialty Exam: Review of Systems  Musculoskeletal:  Positive for arthralgias and back pain.  Psychiatric/Behavioral:  The patient is nervous/anxious.   All other systems reviewed and are negative.   There were no vitals taken for this visit.There is no  height or weight on file to calculate BMI.  General Appearance: NA  Eye Contact:  NA  Speech:  Clear and Coherent  Volume:  Normal  Mood:  Anxious  Affect:  NA  Thought Process:  Goal Directed  Orientation:  Full (Time, Place, and Person)  Thought Content: Rumination   Suicidal Thoughts:  No  Homicidal Thoughts:  No  Memory:  Immediate;   Good Recent;   Good Remote;   Fair  Judgement:  Fair  Insight:  Fair  Psychomotor Activity:  Decreased  Concentration:  Concentration: Good and Attention Span: Good  Recall:  Good  Fund of Knowledge: Good  Language: Good  Akathisia:  No  Handed:  Right  AIMS (if indicated): not done  Assets:  Communication Skills Desire for  Improvement Resilience Social Support  ADL's:  Intact  Cognition: WNL  Sleep:  Fair   Screenings: AIMS    Flowsheet Row Admission (Discharged) from 06/19/2020 in [facility]  AIMS Total Score 0      AUDIT    Flowsheet Row Patient Outreach Telephone from 07/14/2020 in [facility] Admission (Discharged) from 06/19/2020 in [facility] ED to Hosp-Admission (Discharged) from 05/09/2015 in [facility]  Alcohol Use Disorder Identification Test Final Score (AUDIT) 1 1 0      GAD-7    Flowsheet Row Office Visit from 08/25/2021 in [facility] from 12/11/2018 in [facility] from 02/07/2017 in [facility]  Total GAD-7 Score $RemoveBef'13 19 17      'IiJKJhLlSt$ PHQ2-9    Flowsheet Row Video Visit from 09/20/2022 in [facility] Video Visit from 07/22/2022 in [facility] Office Visit from 06/29/2022 in [facility] Video Visit from 04/28/2022 in [facility] Video  Visit from 02/18/2022 in [facility]  PHQ-2 Total Score $RemoveBef'4 1 3 1 2  'dRqAGpPClG$ PHQ-9 Total Score 10 -- 18 -- 8      Flowsheet Row Video Visit from 09/20/2022 in [facility] Video Visit from 07/22/2022 in [facility] Video Visit from 04/28/2022 in [facility] No Risk No Risk No Risk        Assessment and Plan: This patient is a 36 year old female with a history of anxiety depression ADHD pseudoseizures borderline personality disorder and insomnia.  She seems very worried and frightened today about her back pain and going off the narcotics.  However given that she continues to use marijuana there is really no other alternative for pain management.  She voices understanding of this.  For now she will continue Adderall XR 30 mg every morning for focus, Cymbalta 120 mg daily for depression and chronic pain, trazodone 100 to 200 mg for sleep and clonazepam 0.5 mg twice daily only as needed for anxiety.  She will return to see me in 4 weeks  Collaboration of Care: Collaboration of Care: Primary Care Provider AEB notes are shared with PCP through the epic system  Patient/Guardian was advised Release of Information must be obtained prior to any record release in order to collaborate their care with an outside provider. Patient/Guardian was advised if they have not already done so to contact the registration department to sign all necessary forms in order for [name] to release information regarding their care.   Consent: Patient/Guardian gives verbal consent for treatment and assignment of benefits for services provided during this visit. Patient/Guardian expressed understanding and agreed to proceed.    Penny Spiller, MD 09/20/2022, 11:04 AM

## 2022-09-21 ENCOUNTER — Ambulatory Visit (INDEPENDENT_AMBULATORY_CARE_PROVIDER_SITE_OTHER): Payer: Medicaid Other | Admitting: Gastroenterology

## 2022-09-28 ENCOUNTER — Other Ambulatory Visit: Payer: Self-pay | Admitting: Neurology

## 2022-10-13 ENCOUNTER — Other Ambulatory Visit: Payer: Self-pay | Admitting: Family Medicine

## 2022-10-15 ENCOUNTER — Encounter: Payer: Self-pay | Admitting: Hematology

## 2022-10-15 ENCOUNTER — Telehealth: Payer: Self-pay | Admitting: Pharmacy Technician

## 2022-10-15 ENCOUNTER — Encounter: Payer: Self-pay | Admitting: Neurology

## 2022-10-15 ENCOUNTER — Other Ambulatory Visit (HOSPITAL_COMMUNITY): Payer: Self-pay

## 2022-10-15 ENCOUNTER — Other Ambulatory Visit: Payer: Self-pay

## 2022-10-15 ENCOUNTER — Ambulatory Visit: Payer: Medicaid Other | Admitting: Neurology

## 2022-10-15 VITALS — BP 126/86 | HR 128 | Ht 66.0 in | Wt 158.0 lb

## 2022-10-15 DIAGNOSIS — D500 Iron deficiency anemia secondary to blood loss (chronic): Secondary | ICD-10-CM

## 2022-10-15 DIAGNOSIS — G608 Other hereditary and idiopathic neuropathies: Secondary | ICD-10-CM

## 2022-10-15 DIAGNOSIS — G43709 Chronic migraine without aura, not intractable, without status migrainosus: Secondary | ICD-10-CM

## 2022-10-15 MED ORDER — AIMOVIG 140 MG/ML ~~LOC~~ SOAJ: SUBCUTANEOUS | 11 refills | Status: DC

## 2022-10-15 MED ORDER — NURTEC 75 MG PO TBDP: ORAL_TABLET | ORAL | 11 refills | Status: DC

## 2022-10-15 NOTE — Telephone Encounter (Signed)
Patient Advocate Encounter  Prior Authorization for Nurtec '75MG'$  dispersible tablets has been approved.    PA# [id number] [place] [employer] Traditional Effective dates: 10/15/2022 through 10/15/2023      Lyndel Safe, [place] Patient Advocate Specialist [place] Patient Advocate Team Direct Number: [phone]  Fax: [phone]

## 2022-10-15 NOTE — Telephone Encounter (Signed)
Patient Advocate Encounter  Prior Authorization for Aimovig '140MG'$ /ML auto-injectors has been approved.    PA# [id number]  Medicaid Traditional Effective dates: 10/15/2022 through 10/15/2023      Lyndel Safe, [place] Patient Advocate Specialist [place] Patient Advocate Team Direct Number: [phone]  Fax: [phone]

## 2022-10-15 NOTE — Patient Instructions (Signed)
Return to clinic in 6 months.

## 2022-10-15 NOTE — Addendum Note (Signed)
Addended by: Nilda Riggs. on: 10/15/2022 09:48 AM   Modules accepted: Orders

## 2022-10-15 NOTE — Progress Notes (Signed)
Follow-up Visit   Date: 10/15/22   Penny [name] MRN: [id number] DOB: 1985/12/29   Interim History: Penny [name] is a 36 y.o. 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    She has chosen to no longer take dilaudid and has been off opioid therapy for the past month.  She is no longer having "seizure-like episodes" of confusion, but continues to have difficulty with concentration and inattention.  She is being treated for ADHD.   She has noticed worsening headaches over the past three weeks since being dilaudid.  She continues to take Aimovig and has been taking Nurtec 75mg  more frequently (1-2 times per week), whereas previously she was taking it about 2-3 times per month.   She also reports a lot of personal stressors.  Medications:  Current Outpatient Medications on File Prior to Visit  Medication Sig Dispense Refill   ADDERALL XR 30 MG 24 hr capsule TAKE 1 CAPSULE BY MOUTH ONCE EVERY MORNING. 30 capsule 0   AIMOVIG 140 MG/ML SOAJ INJECT 1 PEN INTO THE SKIN ONCE EVERY 28 DAYS. 1 mL 0   albuterol (VENTOLIN HFA) 108 (90 Base) MCG/ACT inhaler Inhale into the lungs every 6 (six) hours as needed for wheezing or shortness of breath.     Calcium Carb-Cholecalciferol (CALCIUM-VITAMIN D3) 600-500 MG-UNIT CAPS Take 1 tablet by mouth daily.     celecoxib (CELEBREX) 50 MG capsule Take by mouth 2 (two) times daily.     cetirizine (ZYRTEC) 10 MG tablet TAKE 1 TABLET BY MOUTH ONCE DAILY 30 tablet 0   clidinium-chlordiazePOXIDE (LIBRAX) 5-2.5 MG capsule TAKE (1) CAPSULE BY MOUTH 3 TIMES DAILY BEFORE MEALS. (Patient taking differently: prn) 90 capsule 0   clonazePAM (KLONOPIN) 0.5 MG tablet Take 1 tablet (0.5 mg total) by mouth 2 (two) times daily as needed. 60 tablet 2   cyclobenzaprine (FLEXERIL) 10 MG tablet Take 10  mg by mouth 3 (three) times daily.     DULoxetine (CYMBALTA) 60 MG capsule Take 1 capsule (60 mg total) by mouth 2 (two) times daily. 180 capsule 2   fluticasone (FLOVENT HFA) 220 MCG/ACT inhaler Inhale 2 puffs into the lungs 2 (two) times daily. Do not inhale, swallow powder and drink water , do not eat food for 20 minutes after puffs 4 each 2   levalbuterol (XOPENEX HFA) 45 MCG/ACT inhaler INHALE (2) PUFFS INTO THE LUNGS EVERY SIX HOURS AS NEEDED FOR WHEEZING. 15 g 3   levalbuterol (XOPENEX) 1.25 MG/3ML nebulizer solution Take 1.25 mg by nebulization every 4 (four) hours as needed for wheezing. 50 mL 3   linaclotide (LINZESS) 290 MCG CAPS capsule Take 1 capsule (290 mcg total) by mouth daily before breakfast. 90 capsule 3   pantoprazole (PROTONIX) 40 MG tablet Take 1 tablet (40 mg total) by mouth daily. 30 tablet 5   Rimegepant Sulfate (NURTEC) 75 MG TBDP TAKE 1 TABLET BY MOUTH AS NEEDED. MAX TWICE WEEKLY. 8 tablet 0   traZODone (DESYREL) 100 MG tablet Take 2 tablets (200 mg total) by mouth at bedtime. (Patient taking differently: Take 200 mg by mouth at bedtime. 1.5 tablets at bedtime) 60 tablet 2   valACYclovir (VALTREX) 1000 MG tablet TAKE 1 TABLET BY MOUTH DAILY. 30 tablet PRN   Vitamin D, Ergocalciferol, (DRISDOL) 1.25 MG (50000 UNIT) CAPS capsule Take 50,000 Units by mouth every Wednesday.     cefdinir (  OMNICEF) 300 MG capsule Take 1 capsule (300 mg total) by mouth 2 (two) times daily. (Patient not taking: Reported on 10/15/2022) 14 capsule 0   No current facility-administered medications on file prior to visit.    Allergies:  Allergies  Allergen Reactions   Gabapentin Other (See Comments)    Pt states that she was unresponsive after taking this medication, but her vitals remained stable.     Metoclopramide Hcl Anxiety and Other (See Comments)    Pt states that she felt like she was trapped in a box, and could not get out.  Pt also states that she had temporary loss of movement,  weakness, and tingling.     Tramadol Other (See Comments)    Seizures   Ativan [Lorazepam] Other (See Comments)    combative   Latex Itching, Rash and Other (See Comments)    Burns   Lyrica [Pregabalin] Other (See Comments)    suicidal   Iron Other (See Comments)    PATIENT IS INTOLERANT TO ORAL IRON , SHE DOES NOT ABSORB IT.     Sulfa Antibiotics Hives   Butrans [Buprenorphine] Rash    Patch caused rash, didn't help with pain   Nucynta [Tapentadol] Nausea And Vomiting   Propofol Nausea And Vomiting   Tape Hives, Itching and Rash    Please use paper tape   Toradol [Ketorolac Tromethamine] Anxiety    Vital Signs:  BP 126/86   Pulse (!) 128   Ht 5\' 6"  (1.676 m)   Wt 158 lb (71.7 kg)   SpO2 97%   BMI 25.50 kg/m   Neurological Exam: MENTAL STATUS including orientation to time, place, person, recent and remote memory, attention span and concentration, language, and fund of knowledge is normal.  Speech is not dysarthric.  CRANIAL NERVES:  N Normal conjugate, extra-ocular eye movements in all directions of gaze.  No ptosis.  Face is symmetric.   MOTOR:  Motor strength is 5/5 in the arms and proximally in the legs.  No give-way weakness today. No atrophy, fasciculations or abnormal movements.  No pronator drift.  Tone is normal.    MSRs:  Reflexes are 2+/4 throughout.  SENSORY:  Vibration intact throughout  COORDINATION/GAIT:  Gait is normal.  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 Mild maxillary sinus disease Otherwise unremarkable MRI brain.  MRI brain wo contrast 07/29/2021: No acute intracranial abnormality. Stable and normal noncontrast MRI appearance of the brain.  MRI lumbar spine wo contrast 07/29/2021: 1. No acute osseous injury of the lumbar spine. 2. 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 3. No evidence of cord compression.  IMPRESSION/PLAN: Chronic migraine, worsening after stopping opioid.  At this time,  I do not suggest medication changes until her body has adjusted to being off opioid therapy  - Continue Aimovig 140mg  every 28 days  - Continue Nurtec 75mg  for severe migraine  - Neck PT declined  3.   Confusional spells, improved since being off dilaudid.  4.  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  Return to clinic in 6 months  Thank you for allowing me to participate in patient's care.  If I can answer any additional questions, I would be pleased to do so.    Sincerely,    Norman Piacentini K. Allena Katz, DO

## 2022-10-18 ENCOUNTER — Encounter: Payer: Self-pay | Admitting: Hematology

## 2022-10-19 ENCOUNTER — Telehealth (INDEPENDENT_AMBULATORY_CARE_PROVIDER_SITE_OTHER): Payer: Medicaid Other | Admitting: Psychiatry

## 2022-10-19 ENCOUNTER — Encounter (HOSPITAL_COMMUNITY): Payer: Self-pay | Admitting: Psychiatry

## 2022-10-19 DIAGNOSIS — F450 Somatization disorder: Secondary | ICD-10-CM | POA: Diagnosis not present

## 2022-10-19 DIAGNOSIS — F332 Major depressive disorder, recurrent severe without psychotic features: Secondary | ICD-10-CM | POA: Diagnosis not present

## 2022-10-19 DIAGNOSIS — F411 Generalized anxiety disorder: Secondary | ICD-10-CM | POA: Diagnosis not present

## 2022-10-19 DIAGNOSIS — F900 Attention-deficit hyperactivity disorder, predominantly inattentive type: Secondary | ICD-10-CM | POA: Diagnosis not present

## 2022-10-19 MED ORDER — TRAZODONE HCL 100 MG PO TABS: 200.0000 mg | ORAL_TABLET | Freq: Every day | ORAL | 2 refills | Status: DC

## 2022-10-19 MED ORDER — ADDERALL XR 30 MG PO CP24: ORAL_CAPSULE | ORAL | 0 refills | Status: DC

## 2022-10-19 MED ORDER — DULOXETINE HCL 60 MG PO CPEP: 60.0000 mg | ORAL_CAPSULE | Freq: Two times a day (BID) | ORAL | 2 refills | Status: DC

## 2022-10-19 MED ORDER — CLONAZEPAM 0.5 MG PO TABS: 0.5000 mg | ORAL_TABLET | Freq: Three times a day (TID) | ORAL | 2 refills | Status: DC | PRN

## 2022-10-19 NOTE — Progress Notes (Signed)
Virtual Visit via Telephone Note  I connected with Penny [name] on 10/19/22 at 11:00 AM EST by telephone and verified that I am speaking with the correct person using two identifiers.  Location: Patient: home Provider: office   I discussed the limitations, risks, security and privacy concerns of performing an evaluation and management service by telephone and the availability of in person appointments. I also discussed with the patient that there may be a patient responsible charge related to this service. The patient expressed understanding and agreed to proceed.    I discussed the assessment and treatment plan with the patient. The patient was provided an opportunity to ask questions and all were answered. The patient agreed with the plan and demonstrated an understanding of the instructions.   The patient was advised to call back or seek an in-person evaluation if the symptoms worsen or if the condition fails to improve as anticipated.  I provided 15 minutes of non-face-to-face time during this encounter.    , MD  BH MD/PA/NP OP Progress Note  10/19/2022 11:11 AM Penny [name]  MRN:  [id number]  Chief Complaint:  Chief Complaint  Patient presents with   Depression   Anxiety   Follow-up   HPI:  This patient is a 36-year-old female who lives alone in [place].  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  She has 2 children with whom she shares custody with their father's   The patient returns for follow-up after 4 weeks.  She states that she is totally off narcotic pain management medication.  Actually she had told me this last month.  She is having a lot more anxiety.  She asked about Vraylar but I explained this was for diagnosed patients with bipolar disorder.  She has already tried Abilify and Rexulti for augmentation with no help.  She denies being significantly more depressed but is more anxious particular because she is going into a custody battle for  her daughter.  She asked if we can increase the clonazepam to 0.5 mg 3 times daily and I think this is reasonable.  She denies thoughts of self-harm or suicide.  Most of the time she is staying focused.  She did sound more upbeat today Visit Diagnosis:    ICD-10-CM   1. GAD (generalized anxiety disorder)  F41.1     2. Attention deficit hyperactivity disorder (ADHD), predominantly inattentive type  F90.0     3. 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.0       Past Psychiatric History: Long-term outpatient treatment, hospitalization last year for depression  Past Medical History:  Past Medical History:  Diagnosis Date   ADD (attention deficit disorder)    Anemia 2011   2o to GASTRIC BYPASS   Anginal pain (HCC)    Anxiety    Asthma    Blood transfusion without reported diagnosis    Chronic daily headache    Depression    Depression    Dysmenorrhea 09/18/2013   Dysrhythmia    Elevated liver enzymes JUL 2011ALK PHOS 111-127 AST  143-267 ALT  213-321T BILI 0.6  ALB  3.7-4.06 Jun 2011 [facility] PHOS 118 AST 24 ALT 42 T BILI 0.4 ALB 3.9   Encounter for drug rehabilitation    at behavioral health for opioid addiction about 4 years ago   Family history of adverse reaction to anesthesia    'dad had   to be kept on pump for breathing for morphine'   Fatty liver    Fibroid 01/18/2017   Fibromyalgia    Gastric bypass status for obesity    Gastritis JULY 2011   Heart murmur    Hereditary and idiopathic peripheral neuropathy 01/15/2015   History of Holter monitoring    Hx of opioid abuse ([place])    for about 4 years, about 4 years ago   Interstitial cystitis    Iron deficiency anemia 07/23/2010   Irritable bowel syndrome 2012 DIARRHEA   JUN 2012 TTG IgA 14.9   IUD FEB 2010   Lupus ([place])    Menorrhagia 07/18/2013   Migraines    Obesity (BMI 30-39.9) 2011 228 LBS BMI 36.8   Osteoporosis    Ovarian cyst     Patient desires pregnancy 09/18/2013   Polyneuropathy    PONV (postoperative nausea and vomiting) seizure post-operatively   seizure following ankle surgery   Potassium (K) deficiency    Pregnant 12/25/2013   Psychiatric pseudoseizure    RLQ abdominal pain 07/18/2013   Sciatica of left side 11/14/2014   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   Stress 09/18/2013    Past Surgical History:  Procedure Laterality Date   BIOPSY  04/10/2015   Procedure: BIOPSY;  Surgeon: Daneil Dolin, MD;  Location: [facility];  Service: Endoscopy;;   BIOPSY  04/15/2021   Procedure: BIOPSY;  Surgeon: Montez Morita, Quillian Quince, MD;  Location[facility];  Service: Gastroenterology;;   cath self every nite     for sodium bicarb injection (discontinued 2013)   CHOLECYSTECTOMY  2005   biliary dyskinesia   COLONOSCOPY  JUN 2012 ABD PN/DIARRHEA WITH PROPOFOL   NL COLON   COLONOSCOPY WITH PROPOFOL N/A 01/16/2021   Procedure: COLONOSCOPY WITH PROPOFOL;  Surgeon: Harvel Quale, MD;  [facility];  Service: Gastroenterology;  Laterality: N/A;  1:45   DILATION AND CURETTAGE OF UTERUS     DILITATION & CURRETTAGE/HYSTROSCOPY WITH NOVASURE ABLATION N/A 03/24/2017   Procedure: DILATATION & CURETTAGE/HYSTEROSCOPY WITH NOVASURE ENDOMETRIAL ABLATION;  Surgeon: Jonnie Kind, MD;  Location: [facility];  Service: Gynecology;  Laterality: N/A;   ESOPHAGEAL DILATION N/A 04/10/2015   Procedure: ESOPHAGEAL DILATION WITH 54FR MALONEY DILATOR;  Surgeon: Daneil Dolin, MD;  Location: [facility];  Service: Endoscopy;  Laterality: N/A;   ESOPHAGOGASTRODUODENOSCOPY     ESOPHAGOGASTRODUODENOSCOPY (EGD) WITH PROPOFOL N/A 04/10/2015   Procedure: ESOPHAGOGASTRODUODENOSCOPY (EGD) WITH PROPOFOL;  Surgeon: Daneil Dolin, MD;  Location: [facility];  Service: Endoscopy;  Laterality: N/A;   ESOPHAGOGASTRODUODENOSCOPY (EGD) WITH PROPOFOL N/A 12/06/2016   Procedure: ESOPHAGOGASTRODUODENOSCOPY (EGD) WITH PROPOFOL;  Surgeon: Danie Binder,  MD;  [facility];  Service: Endoscopy;  Laterality: N/A;   ESOPHAGOGASTRODUODENOSCOPY (EGD) WITH PROPOFOL N/A 04/27/2019   Procedure: ESOPHAGOGASTRODUODENOSCOPY (EGD) WITH PROPOFOL;  Surgeon: Rogene Houston, MD;  [facility];  Service: Endoscopy;  Laterality: N/A;  730   ESOPHAGOGASTRODUODENOSCOPY (EGD) WITH PROPOFOL N/A 08/31/2019   Procedure: ESOPHAGOGASTRODUODENOSCOPY (EGD) WITH PROPOFOL;  Surgeon: Rogene Houston, MD;  [facility];  Service: Endoscopy;  Laterality: N/A;   ESOPHAGOGASTRODUODENOSCOPY (EGD) WITH PROPOFOL N/A 01/16/2021   Procedure: ESOPHAGOGASTRODUODENOSCOPY (EGD) WITH PROPOFOL;  Surgeon: Harvel Quale, MD;  [facility];  Service: Gastroenterology;  Laterality: N/A;   ESOPHAGOGASTRODUODENOSCOPY (EGD) WITH PROPOFOL N/A 04/15/2021   Procedure: ESOPHAGOGASTRODUODENOSCOPY (EGD) WITH PROPOFOL;  Surgeon: Harvel Quale, MD;  [facility];  Service: Gastroenterology;  Laterality: N/A;  9:00 AM   GAB  2007   in [place]-POUCH 5 CM   GASTRIC BYPASS  06/2006   HYSTEROSCOPY WITH D & C N/A 09/12/2014   Procedure: DILATATION AND CURETTAGE /HYSTEROSCOPY;  Surgeon: John Ferguson V, MD;  Location: [facility];  Service: Gynecology;  Laterality: N/A;   KYPHOPLASTY N/A 08/02/2018   Procedure: KYPHOPLASTY T12;  Surgeon: Brooks, Dahari, MD;  Location: [facility];  Service: Orthopedics;  Laterality: N/A;  60 mins   LAPAROSCOPIC TUBAL LIGATION Bilateral 03/24/2017   Procedure: LAPAROSCOPIC TUBAL LIGATION ([place] Rings);  Surgeon: John Ferguson V, MD;  Location: [facility];  Service: Gynecology;  Laterality: Bilateral;   REPAIR VAGINAL CUFF N/A 07/30/2014   Procedure: REPAIR VAGINAL CUFF;  Surgeon: [name] Constant, MD;  Location: [facility];  Service: Gynecology;  Laterality: N/A;   SAVORY DILATION  06/20/2012   Dr. Fields:Mild gastritis/Ulcer in the mid jejunum. Empiric dilation.    SURAL NERVE BX Left 02/25/2016   Procedure: LEFT SURAL  NERVE BIOPSY;  Surgeon: Robert Nudelman, MD;  [facility];  Service: Neurosurgery;  Laterality: Left;  Left sural nerve biopsy   TONSILLECTOMY     TONSILLECTOMY AND ADENOIDECTOMY     UPPER GASTROINTESTINAL ENDOSCOPY  JULY 2011 NAUSEA-D125,V6, PH 25   Bx; GASTRITIS, POUCH-5 CM LONG   WISDOM TOOTH EXTRACTION      Family Psychiatric History: See below  Family History:  Family History  Problem Relation Age of Onset   Coronary artery disease Paternal Grandfather    Migraines Paternal Grandmother    Breast cancer Paternal Grandmother    Hemochromatosis Maternal Grandmother    Migraines Maternal Grandmother    Cancer Maternal Grandmother    Breast cancer Maternal Grandmother    Hypertension Father    Diabetes Father    Coronary artery disease Father    Cancer Mother        breast   Hemochromatosis Mother    Breast cancer Mother    Depression Mother    Anxiety disorder Mother    Anxiety disorder Brother    Bipolar disorder Brother    Healthy Daughter    Healthy Son     Social History:  Social History   Socioeconomic History   Marital status: Divorced    Spouse name: Not on file   Number of children: 2   Years of education: some college   Highest education level: Some college, no degree  Occupational History   Occupation: Applying for Social Security Disability    Employer: NOT EMPLOYED  Tobacco Use   Smoking status: Former    Packs/day: 0.25    Years: 6.00    Total pack years: 1.50    Types: Cigarettes    Start date: 05/2018   Smokeless tobacco: Never  Vaping Use   Vaping Use: Some days  Substance and Sexual Activity   Alcohol use: Not Currently    Alcohol/week: 0.0 standard drinks of alcohol   Drug use: Not Currently   Sexual activity: Yes    Partners: Male    Birth control/protection: Surgical, Condom    Comment: tubal and ablation  Other Topics Concern   Not on file  Social History Narrative   Patient is right handed.   Patient drinks one cup  of coffee daily.   She lives in a one-story home with her two children (6 year old son, 16 month old daughter).   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 [employer]    Social Determinants of Health   Financial Resource Strain: Medium Risk (08/25/2021)   Overall Financial Resource Strain (CARDIA)    Difficulty of Paying Living Expenses: Somewhat hard  Food Insecurity: Food Insecurity Present (08/25/2021)   Hunger Vital Sign    Worried About Running Out of Food in the Last Year: Sometimes true    Ran Out of Food in the Last Year: Sometimes true  Transportation Needs: No Transportation Needs (08/25/2021)   PRAPARE - Hydrologist (Medical): No    Lack of Transportation (Non-Medical): No  Physical Activity: Inactive (08/25/2021)   Exercise Vital Sign    Days of Exercise per Week: 0 days    Minutes of Exercise per Session: 10 min  Stress: Stress Concern Present (08/25/2021)   [facility]    Feeling of Stress : Very much  Social Connections: Moderately Isolated (08/25/2021)   Social Connection and Isolation Panel [NHANES]    Frequency of Communication with Friends and Family: More than three times a week    Frequency of Social Gatherings with Friends and Family: Once a week    Attends Religious Services: 1 to 4 times per year    Active Member of [employer] or Organizations: No    Attends Archivist Meetings: Never    Marital Status: Divorced    Allergies:  Allergies  Allergen Reactions   Gabapentin Other (See Comments)    Pt states that she was unresponsive after taking this medication, but her vitals remained stable.     Metoclopramide Hcl Anxiety and Other (See Comments)    Pt states that she felt like she was trapped in a box, and could not get out.  Pt also states that she had temporary loss of movement, weakness, and tingling.     Tramadol Other (See  Comments)    Seizures   Ativan [Lorazepam] Other (See Comments)    combative   Latex Itching, Rash and Other (See Comments)    Burns   Lyrica [Pregabalin] Other (See Comments)    suicidal   Iron Other (See Comments)    PATIENT IS INTOLERANT TO ORAL IRON , SHE DOES NOT ABSORB IT.     Sulfa Antibiotics Hives   Butrans [Buprenorphine] Rash    Patch caused rash, didn't help with pain   Nucynta [Tapentadol] Nausea And Vomiting   Propofol Nausea And Vomiting   Tape Hives, Itching and Rash    Please use paper tape   Toradol [Ketorolac Tromethamine] Anxiety    Metabolic Disorder Labs: Lab Results  Component Value Date   HGBA1C 5.7 (H) 01/15/2015   Lab Results  Component Value Date   PROLACTIN 80.0 (H) 05/09/2015   Lab Results  Component Value Date   CHOL 95 03/10/2012   TRIG 50 03/10/2012   HDL 38 (L) 03/10/2012   CHOLHDL 2.5 03/10/2012   VLDL 10 03/10/2012   LDLCALC 47 03/10/2012   Lab Results  Component Value Date   TSH 1.350 01/01/2021   TSH 2.616 08/25/2019    Therapeutic Level Labs: No results found for: "LITHIUM" Lab Results  Component Value Date   VALPROATE 66 06/23/2020   Lab Results  Component Value Date   CBMZ 8.0 06/14/2017   CBMZ 6.8 02/24/2016    Current Medications: Current Outpatient Medications  Medication Sig Dispense Refill   ADDERALL XR 30 MG 24 hr capsule TAKE 1 CAPSULE BY MOUTH ONCE  EVERY MORNING. 30 capsule 0   albuterol (VENTOLIN HFA) 108 (90 Base) MCG/ACT inhaler Inhale into the lungs every 6 (six) hours as needed for wheezing or shortness of breath.     Calcium Carb-Cholecalciferol (CALCIUM-VITAMIN D3) 600-500 MG-UNIT CAPS Take 1 tablet by mouth daily.     cefdinir (OMNICEF) 300 MG capsule Take 1 capsule (300 mg total) by mouth 2 (two) times daily. (Patient not taking: Reported on 10/15/2022) 14 capsule 0   celecoxib (CELEBREX) 50 MG capsule Take by mouth 2 (two) times daily.     cetirizine (ZYRTEC) 10 MG tablet TAKE 1 TABLET BY MOUTH  ONCE DAILY 30 tablet 0   clidinium-chlordiazePOXIDE (LIBRAX) 5-2.5 MG capsule TAKE (1) CAPSULE BY MOUTH 3 TIMES DAILY BEFORE MEALS. (Patient taking differently: prn) 90 capsule 0   clonazePAM (KLONOPIN) 0.5 MG tablet Take 1 tablet (0.5 mg total) by mouth 3 (three) times daily as needed. 90 tablet 2   cyclobenzaprine (FLEXERIL) 10 MG tablet Take 10 mg by mouth 3 (three) times daily.     DULoxetine (CYMBALTA) 60 MG capsule Take 1 capsule (60 mg total) by mouth 2 (two) times daily. 180 capsule 2   Erenumab-aooe (AIMOVIG) 140 MG/ML SOAJ INJECT 1 PEN INTO THE SKIN ONCE EVERY 28 DAYS. 1 mL 11   fluticasone (FLOVENT HFA) 220 MCG/ACT inhaler Inhale 2 puffs into the lungs 2 (two) times daily. Do not inhale, swallow powder and drink water , do not eat food for 20 minutes after puffs 4 each 2   levalbuterol (XOPENEX HFA) 45 MCG/ACT inhaler INHALE (2) PUFFS INTO THE LUNGS EVERY SIX HOURS AS NEEDED FOR WHEEZING. 15 g 3   levalbuterol (XOPENEX) 1.25 MG/3ML nebulizer solution Take 1.25 mg by nebulization every 4 (four) hours as needed for wheezing. 50 mL 3   linaclotide (LINZESS) 290 MCG CAPS capsule Take 1 capsule (290 mcg total) by mouth daily before breakfast. 90 capsule 3   pantoprazole (PROTONIX) 40 MG tablet Take 1 tablet (40 mg total) by mouth daily. 30 tablet 5   Rimegepant Sulfate (NURTEC) 75 MG TBDP TAKE 1 TABLET BY MOUTH AS NEEDED. MAX TWICE WEEKLY. 8 tablet 11   traZODone (DESYREL) 100 MG tablet Take 2 tablets (200 mg total) by mouth at bedtime. (Patient taking differently: Take 200 mg by mouth at bedtime. 1.5 tablets at bedtime) 60 tablet 2   valACYclovir (VALTREX) 1000 MG tablet TAKE 1 TABLET BY MOUTH DAILY. 30 tablet PRN   Vitamin D, Ergocalciferol, (DRISDOL) 1.25 MG (50000 UNIT) CAPS capsule Take 50,000 Units by mouth every Wednesday.     No current facility-administered medications for this visit.     Musculoskeletal: Strength & Muscle Tone: na Gait & Station: na Patient leans:  N/A  Psychiatric Specialty Exam: Review of Systems  Musculoskeletal:  Positive for arthralgias and back pain.  Psychiatric/Behavioral:  The patient is nervous/anxious.   All other systems reviewed and are negative.   There were no vitals taken for this visit.There is no height or weight on file to calculate BMI.  General Appearance: NA  Eye Contact:  NA  Speech:  NA  Volume:  Normal  Mood:  Anxious  Affect:  NA  Thought Process:  Goal Directed  Orientation:  Full (Time, Place, and Person)  Thought Content: Rumination   Suicidal Thoughts:  No  Homicidal Thoughts:  No  Memory:  Immediate;   Good Recent;   Good Remote;   Good  Judgement:  Fair  Insight:  Fair  Psychomotor Activity:  Normal  Concentration:  Concentration: Good and Attention Span: Good  Recall:  Good  Fund of Knowledge: Good  Language: Good  Akathisia:  No  Handed:  Right  AIMS (if indicated): not done  Assets:  Communication Skills Desire for Improvement Resilience Social Support Talents/Skills  ADL's:  Intact  Cognition: WNL  Sleep:  Good   Screenings: AIMS    Flowsheet Row Admission (Discharged) from 06/19/2020 in [facility]  AIMS Total Score 0      AUDIT    Flowsheet Row Patient Outreach Telephone from 07/14/2020 in [facility] Coordination Admission (Discharged) from 06/19/2020 in [facility] ED to [facility]osp-Admission (Discharged) from 05/09/2015 in [facility]  Alcohol Use Disorder Identification Test Final Score (AUDIT) 1 1 0      GAD-7    Flowsheet Row Office Visit from 08/25/2021 in [facility] from 12/11/2018 in [facility] from 02/07/2017 in [facility]  Total GAD-7 Score _0 PHQ2-9    Flowsheet Row Video Visit from 09/20/2022 in [facility] Video Visit from 07/22/2022 in [facility] Office Visit from 06/29/2022 in [facility] Video Visit from 04/28/2022 in [facility] Video Visit from 02/18/2022 in [facility]  PHQ-2 Total Score _1 PHQ-9 Total Score 10 -- 18 -- 8      Flowsheet Row Video Visit from 09/20/2022 in [facility] Video Visit from 07/22/2022 in [facility] Video Visit from 04/28/2022 in [facility] No Risk No Risk No Risk        Assessment and Plan: This patient is a 36 year old female with a history of anxiety depression ADHD pseudoseizures borderline personality disorder and insomnia.  She does seem more upbeat today but claims that she is more anxious.  She will continue trazodone 150 mg to 200 mg at bedtime for sleep Adderall XR 30 mg every morning for focus, Cymbalta 120 mg daily for depression and chronic pain.  Per her request we will increase clonazepam to 0.5 mg 3 times daily as needed for anxiety.  She will return to see me in 4 weeks  Collaboration of Care: Collaboration of Care: Primary Care Provider AEB notes are shared with PCP on the epic system  Patient/Guardian was advised Release of Information must be obtained prior to any record release in order to collaborate their care with an outside provider. Patient/Guardian was advised if they have not already done so to contact the registration department to sign all necessary forms in order for [name] to release information regarding their care.   Consent: Patient/Guardian gives verbal consent for treatment and assignment of benefits for services provided during this visit. Patient/Guardian expressed understanding and agreed to  proceed.    [name], MD 10/19/2022, 11:11 AM

## 2022-10-27 ENCOUNTER — Telehealth: Payer: Medicaid Other | Admitting: Physician Assistant

## 2022-10-27 DIAGNOSIS — B379 Candidiasis, unspecified: Secondary | ICD-10-CM

## 2022-10-27 DIAGNOSIS — J020 Streptococcal pharyngitis: Secondary | ICD-10-CM

## 2022-10-27 DIAGNOSIS — T3695XA Adverse effect of unspecified systemic antibiotic, initial encounter: Secondary | ICD-10-CM | POA: Diagnosis not present

## 2022-10-27 MED ORDER — AMOXICILLIN 500 MG PO CAPS: 500.0000 mg | ORAL_CAPSULE | Freq: Two times a day (BID) | ORAL | 0 refills | Status: DC

## 2022-10-27 MED ORDER — FLUCONAZOLE 150 MG PO TABS: 150.0000 mg | ORAL_TABLET | Freq: Once | ORAL | 0 refills | Status: AC

## 2022-10-27 MED ORDER — CEFDINIR 300 MG PO CAPS: 300.0000 mg | ORAL_CAPSULE | Freq: Two times a day (BID) | ORAL | 0 refills | Status: DC

## 2022-10-27 NOTE — Progress Notes (Signed)
E-Visit for Sore Throat - Strep Symptoms  We are sorry that you are not feeling well.  Here is how we plan to help!  Based on what you have shared with me it is likely that you have strep pharyngitis.  Strep pharyngitis is inflammation and infection in the back of the throat.  This is an infection cause by bacteria and is treated with antibiotics.  I have prescribed Amoxicillin 500 mg twice a day for 10 days. For throat pain, we recommend over the counter oral pain relief medications such as acetaminophen or aspirin, or anti-inflammatory medications such as ibuprofen or naproxen sodium. Topical treatments such as oral throat lozenges or sprays may be used as needed. Strep infections are not as easily transmitted as other respiratory infections, however we still recommend that you avoid close contact with loved ones, especially the very young and elderly.  Remember to wash your hands thoroughly throughout the day as this is the number one way to prevent the spread of infection and wipe down door knobs and counters with disinfectant.   Home Care: Only take medications as instructed by your medical team. Complete the entire course of an antibiotic. Do not take these medications with alcohol. A steam or ultrasonic humidifier can help congestion.  You can place a towel over your head and breathe in the steam from hot water coming from a faucet. Avoid close contacts especially the very young and the elderly. Cover your mouth when you cough or sneeze. Always remember to wash your hands.  Get Help Right Away If: You develop worsening fever or sinus pain. You develop a severe head ache or visual changes. Your symptoms persist after you have completed your treatment plan.  Make sure you Understand these instructions. Will watch your condition. Will get help right away if you are not doing well or get worse.   Thank you for choosing an e-visit.  Your e-visit answers were reviewed by a board  certified advanced clinical practitioner to complete your personal care plan. Depending upon the condition, your plan could have included both over the counter or prescription medications.  Please review your pharmacy choice. Make sure the pharmacy is open so you can pick up prescription now. If there is a problem, you may contact your provider through MyChart messaging and have the prescription routed to another pharmacy.  Your safety is important to us. If you have drug allergies check your prescription carefully.   For the next 24 hours you can use MyChart to ask questions about today's visit, request a non-urgent call back, or ask for a work or school excuse. You will get an email in the next two days asking about your experience. I hope that your e-visit has been valuable and will speed your recovery.  I have spent 5 minutes in review of e-visit questionnaire, review and updating patient chart, medical decision making and response to patient.   Timmia Cogburn M Sianna Garofano, PA-C  

## 2022-10-27 NOTE — Addendum Note (Signed)
Addended by: Mar Daring on: 10/27/2022 11:33 AM   Modules accepted: Orders

## 2022-11-05 ENCOUNTER — Encounter: Payer: Self-pay | Admitting: Hematology

## 2022-11-18 ENCOUNTER — Telehealth (INDEPENDENT_AMBULATORY_CARE_PROVIDER_SITE_OTHER): Payer: Medicaid Other | Admitting: Psychiatry

## 2022-11-18 ENCOUNTER — Encounter (HOSPITAL_COMMUNITY): Payer: Self-pay | Admitting: Psychiatry

## 2022-11-18 DIAGNOSIS — F411 Generalized anxiety disorder: Secondary | ICD-10-CM | POA: Diagnosis not present

## 2022-11-18 DIAGNOSIS — F332 Major depressive disorder, recurrent severe without psychotic features: Secondary | ICD-10-CM | POA: Diagnosis not present

## 2022-11-18 DIAGNOSIS — F900 Attention-deficit hyperactivity disorder, predominantly inattentive type: Secondary | ICD-10-CM | POA: Diagnosis not present

## 2022-11-18 MED ORDER — DULOXETINE HCL 60 MG PO CPEP: 60.0000 mg | ORAL_CAPSULE | Freq: Two times a day (BID) | ORAL | 2 refills | Status: DC

## 2022-11-18 MED ORDER — ADDERALL XR 30 MG PO CP24: ORAL_CAPSULE | ORAL | 0 refills | Status: DC

## 2022-11-18 MED ORDER — PROPRANOLOL HCL 10 MG PO TABS: 10.0000 mg | ORAL_TABLET | Freq: Three times a day (TID) | ORAL | 2 refills | Status: DC

## 2022-11-18 MED ORDER — CLONAZEPAM 0.5 MG PO TABS: 0.5000 mg | ORAL_TABLET | Freq: Three times a day (TID) | ORAL | 2 refills | Status: DC | PRN

## 2022-11-18 MED ORDER — TRAZODONE HCL 100 MG PO TABS: 200.0000 mg | ORAL_TABLET | Freq: Every day | ORAL | 2 refills | Status: DC

## 2022-11-18 NOTE — Progress Notes (Signed)
Virtual Visit via Video Note  I connected with Penny [name] on 11/18/22 at 10:40 AM EST by a video enabled telemedicine application and verified that I am speaking with the correct person using two identifiers.  Location: Patient: home Provider: office   I discussed the limitations of evaluation and management by telemedicine and the availability of in person appointments. The patient expressed understanding and agreed to proceed.    I discussed the assessment and treatment plan with the patient. The patient was provided an opportunity to ask questions and all were answered. The patient agreed with the plan and demonstrated an understanding of the instructions.   The patient was advised to call back or seek an in-person evaluation if the symptoms worsen or if the condition fails to improve as anticipated.  I provided 15 minutes of non-face-to-face time during this encounter.   [name], MD  [facility] MD/PA/NP OP Progress Note  11/18/2022 11:13 AM Penny [name]  MRN:  [id number]  Chief Complaint:  Chief Complaint  Patient presents with   Anxiety   Depression   Follow-up   HPI:  This patient is a 36 year old female who lives alone in [place].  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  She has 2 children with whom she shares custody with her father's.   The patient returns for follow-up after 2 months regarding her ADD anxiety and depression.  She states that about a month ago she went off all of her pain medication.  She is determined to stay off of this although she still having a lot of back and leg pain.  She is using a lot of herbal preparations and she is seeing some kind of naturopathic provider.  She has only been doing this for about a week.  She is still having a lot of shaking particularly in her hands.  This still could be some extended narcotic withdrawal.  I offered to add clonidine or propranolol and I think we will start with a low-dose of propranolol.  She  is trying to stay positive and working really hard at it.  She denies significant depression.  She states that she is trying to eat better and maintain her health.  Denies thoughts of suicide or self-harm Visit Diagnosis:    ICD-10-CM   1. GAD (generalized anxiety disorder)  F41.1     2. Attention deficit hyperactivity disorder (ADHD), predominantly inattentive type  F90.0     3. Major depressive disorder, recurrent episode, 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[place])  F33.2       Past Psychiatric History: Long-term outpatient treatment, hospitalization last year for depression  Past Medical History:  Past Medical History:  Diagnosis Date   ADD (attention deficit disorder)    Anemia 2011   2o to GASTRIC BYPASS   Anginal pain ([place])    Anxiety    Asthma    Blood transfusion without reported diagnosis    Chronic daily headache    Depression    Depression    Dysmenorrhea 09/18/2013   Dysrhythmia    Elevated liver enzymes JUL 2011ALK PHOS 111-127 AST  143-267 ALT  213-321T BILI 0.6  ALB  3.7-4.06 Jun 2011 ALK PHOS 118 AST 24 ALT 42 T BILI 0.4 ALB 3.9   Encounter for drug rehabilitation    at behavioral health for opioid addiction about 4 years ago   Family history of adverse reaction to anesthesia    'dad had to be kept on pump for  breathing for morphine'   Fatty liver    Fibroid 01/18/2017   Fibromyalgia    Gastric bypass status for obesity    Gastritis JULY 2011   Heart murmur    Hereditary and idiopathic peripheral neuropathy 01/15/2015   History of Holter monitoring    Hx of opioid abuse ([place])    for about 4 years, about 4 years ago   Interstitial cystitis    Iron deficiency anemia 07/23/2010   Irritable bowel syndrome 2012 DIARRHEA   JUN 2012 TTG IgA 14.9   IUD FEB 2010   Lupus ([place])    Menorrhagia 07/18/2013   Migraines    Obesity (BMI 30-39.9) 2011 228 LBS BMI 36.8   Osteoporosis    Ovarian cyst    Patient desires pregnancy 09/18/2013   Polyneuropathy    PONV  (postoperative nausea and vomiting) seizure post-operatively   seizure following ankle surgery   Potassium (K) deficiency    Pregnant 12/25/2013   Psychiatric pseudoseizure    RLQ abdominal pain 07/18/2013   Sciatica of left side 11/14/2014   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   Stress 09/18/2013    Past Surgical History:  Procedure Laterality Date   BIOPSY  04/10/2015   Procedure: BIOPSY;  Surgeon: Daneil Dolin, MD;  Location: [facility];  Service: Endoscopy;;   BIOPSY  04/15/2021   Procedure: BIOPSY;  Surgeon: Montez Morita, Quillian Quince, MD;  [facility];  Service: Gastroenterology;;   cath self every nite     for sodium bicarb injection (discontinued 2013)   CHOLECYSTECTOMY  2005   biliary dyskinesia   COLONOSCOPY  JUN 2012 ABD PN/DIARRHEA WITH PROPOFOL   NL COLON   COLONOSCOPY WITH PROPOFOL N/A 01/16/2021   Procedure: COLONOSCOPY WITH PROPOFOL;  Surgeon: Harvel Quale, MD;  [facility];  Service: Gastroenterology;  Laterality: N/A;  1:45   DILATION AND CURETTAGE OF UTERUS     DILITATION & CURRETTAGE/HYSTROSCOPY WITH NOVASURE ABLATION N/A 03/24/2017   Procedure: DILATATION & CURETTAGE/HYSTEROSCOPY WITH NOVASURE ENDOMETRIAL ABLATION;  Surgeon: Jonnie Kind, MD;  Location: [facility];  Service: Gynecology;  Laterality: N/A;   ESOPHAGEAL DILATION N/A 04/10/2015   Procedure: ESOPHAGEAL DILATION WITH 54FR MALONEY DILATOR;  Surgeon: Daneil Dolin, MD;  Location: [facility];  Service: Endoscopy;  Laterality: N/A;   ESOPHAGOGASTRODUODENOSCOPY     ESOPHAGOGASTRODUODENOSCOPY (EGD) WITH PROPOFOL N/A 04/10/2015   Procedure: ESOPHAGOGASTRODUODENOSCOPY (EGD) WITH PROPOFOL;  Surgeon: Daneil Dolin, MD;  [facility];  Service: Endoscopy;  Laterality: N/A;   ESOPHAGOGASTRODUODENOSCOPY (EGD) WITH PROPOFOL N/A 12/06/2016   Procedure: ESOPHAGOGASTRODUODENOSCOPY (EGD) WITH PROPOFOL;  Surgeon: Danie Binder, MD;  [facility];  Service: Endoscopy;  Laterality:  N/A;   ESOPHAGOGASTRODUODENOSCOPY (EGD) WITH PROPOFOL N/A 04/27/2019   Procedure: ESOPHAGOGASTRODUODENOSCOPY (EGD) WITH PROPOFOL;  Surgeon: Rogene Houston, MD;  [facility];  Service: Endoscopy;  Laterality: N/A;  730   ESOPHAGOGASTRODUODENOSCOPY (EGD) WITH PROPOFOL N/A 08/31/2019   Procedure: ESOPHAGOGASTRODUODENOSCOPY (EGD) WITH PROPOFOL;  Surgeon: Rogene Houston, MD;  [facility];  Service: Endoscopy;  Laterality: N/A;   ESOPHAGOGASTRODUODENOSCOPY (EGD) WITH PROPOFOL N/A 01/16/2021   Procedure: ESOPHAGOGASTRODUODENOSCOPY (EGD) WITH PROPOFOL;  Surgeon: Harvel Quale, MD;  [facility];  Service: Gastroenterology;  Laterality: N/A;   ESOPHAGOGASTRODUODENOSCOPY (EGD) WITH PROPOFOL N/A 04/15/2021   Procedure: ESOPHAGOGASTRODUODENOSCOPY (EGD) WITH PROPOFOL;  Surgeon: Harvel Quale, MD;  [facility];  Service: Gastroenterology;  Laterality: N/A;  9:00 AM  GAB  2007   in [place]-POUCH 5 CM   GASTRIC BYPASS  06/2006   HYSTEROSCOPY WITH D & C N/A 09/12/2014   Procedure: DILATATION AND CURETTAGE /HYSTEROSCOPY;  Surgeon: Jonnie Kind, MD;  Location: [facility];  Service: Gynecology;  Laterality: N/A;   KYPHOPLASTY N/A 08/02/2018   Procedure: KYPHOPLASTY T12;  Surgeon: Melina Schools, MD;  Location: [facility];  Service: Orthopedics;  Laterality: N/A;  60 mins   LAPAROSCOPIC TUBAL LIGATION Bilateral 03/24/2017   Procedure: LAPAROSCOPIC TUBAL LIGATION ([place] Rings);  Surgeon: Jonnie Kind, MD;  Location: [facility];  Service: Gynecology;  Laterality: Bilateral;   REPAIR VAGINAL CUFF N/A 07/30/2014   Procedure: REPAIR VAGINAL CUFF;  Surgeon: Mora Bellman, MD;  Location: [facility];  Service: Gynecology;  Laterality: N/A;   SAVORY DILATION  06/20/2012   Dr. Barnie Alderman gastritis/Ulcer in the mid jejunum. Empiric dilation.    SURAL NERVE BX Left 02/25/2016   Procedure: LEFT SURAL NERVE BIOPSY;  Surgeon: Jovita Gamma, MD;  Location: [facility];  Service: Neurosurgery;  Laterality: Left;  Left sural nerve biopsy   TONSILLECTOMY     TONSILLECTOMY AND ADENOIDECTOMY     UPPER GASTROINTESTINAL ENDOSCOPY  JULY 2011 NAUSEA-D125,V6, PH 25   Bx; GASTRITIS, POUCH-5 CM LONG   WISDOM TOOTH EXTRACTION      Family Psychiatric History: See below  Family History:  Family History  Problem Relation Age of Onset   Coronary artery disease Paternal Grandfather    Migraines Paternal Grandmother    Breast cancer Paternal Grandmother    Hemochromatosis Maternal Grandmother    Migraines Maternal Grandmother    Cancer Maternal Grandmother    Breast cancer Maternal Grandmother    Hypertension Father    Diabetes Father    Coronary artery disease Father    Cancer Mother        breast   Hemochromatosis Mother    Breast cancer Mother    Depression Mother    Anxiety disorder Mother    Anxiety disorder Brother    Bipolar disorder Brother    Healthy Daughter    Healthy Son     Social History:  Social History   Socioeconomic History   Marital status: Divorced    Spouse name: Not on file   Number of children: 2   Years of education: some college   Highest education level: Some college, no degree  Occupational History   Occupation: Applying for Social Security Disability    Employer: NOT EMPLOYED  Tobacco Use   Smoking status: Former    Packs/day: 0.25    Years: 6.00    Total pack years: 1.50    Types: Cigarettes    Start date: 05/2018   Smokeless tobacco: Never  Vaping Use   Vaping Use: Some days  Substance and Sexual Activity   Alcohol use: Not Currently    Alcohol/week: 0.0 standard drinks of alcohol   Drug use: Not Currently   Sexual activity: Yes    Partners: Male    Birth control/protection: Surgical, Condom    Comment: tubal and ablation  Other Topics Concern   Not on file  Social History Narrative   Patient is right handed.   Patient drinks one cup of coffee daily.   She lives in a one-story home with her two  children (30 year old son, 48 month old daughter).   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 [employer]  Social Determinants of Health   Financial Resource Strain: Medium Risk (08/25/2021)   Overall Financial Resource Strain (CARDIA)    Difficulty of Paying Living Expenses: Somewhat hard  Food Insecurity: Food Insecurity Present (08/25/2021)   Hunger Vital Sign    Worried About Running Out of Food in the Last Year: Sometimes true    Ran Out of Food in the Last Year: Sometimes true  Transportation Needs: No Transportation Needs (08/25/2021)   PRAPARE - Hydrologist (Medical): No    Lack of Transportation (Non-Medical): No  Physical Activity: Inactive (08/25/2021)   Exercise Vital Sign    Days of Exercise per Week: 0 days    Minutes of Exercise per Session: 10 min  Stress: Stress Concern Present (08/25/2021)   [place]    Feeling of Stress : Very much  Social Connections: Moderately Isolated (08/25/2021)   Social Connection and Isolation Panel [NHANES]    Frequency of Communication with Friends and Family: More than three times a week    Frequency of Social Gatherings with Friends and Family: Once a week    Attends Religious Services: 1 to 4 times per year    Active Member of [employer] or Organizations: No    Attends Archivist Meetings: Never    Marital Status: Divorced    Allergies:  Allergies  Allergen Reactions   Gabapentin Other (See Comments)    Pt states that she was unresponsive after taking this medication, but her vitals remained stable.     Metoclopramide Hcl Anxiety and Other (See Comments)    Pt states that she felt like she was trapped in a box, and could not get out.  Pt also states that she had temporary loss of movement, weakness, and tingling.     Tramadol Other (See Comments)    Seizures   Ativan [Lorazepam] Other (See  Comments)    combative   Latex Itching, Rash and Other (See Comments)    [place]   Lyrica [Pregabalin] Other (See Comments)    suicidal   Iron Other (See Comments)    PATIENT IS INTOLERANT TO ORAL IRON , SHE DOES NOT ABSORB IT.     Sulfa Antibiotics Hives   Butrans [Buprenorphine] Rash    Patch caused rash, didn't help with pain   Nucynta [Tapentadol] Nausea And Vomiting   Propofol Nausea And Vomiting   Tape Hives, Itching and Rash    Please use paper tape   Toradol [Ketorolac Tromethamine] Anxiety    Metabolic Disorder Labs: Lab Results  Component Value Date   HGBA1C 5.7 (H) 01/15/2015   Lab Results  Component Value Date   PROLACTIN 80.0 (H) 05/09/2015   Lab Results  Component Value Date   CHOL 95 03/10/2012   TRIG 50 03/10/2012   HDL 38 (L) 03/10/2012   CHOLHDL 2.5 03/10/2012   VLDL 10 03/10/2012   LDLCALC 47 03/10/2012   Lab Results  Component Value Date   TSH 1.350 01/01/2021   TSH 2.616 08/25/2019    Therapeutic Level Labs: No results found for: "LITHIUM" Lab Results  Component Value Date   VALPROATE 66 06/23/2020   Lab Results  Component Value Date   CBMZ 8.0 06/14/2017   CBMZ 6.8 02/24/2016    Current Medications: Current Outpatient Medications  Medication Sig Dispense Refill   propranolol (INDERAL) 10 MG tablet Take 1 tablet (10 mg total) by mouth 3 (three) times daily. 90 tablet 2  ADDERALL XR 30 MG 24 hr capsule TAKE 1 CAPSULE BY MOUTH ONCE EVERY MORNING. 30 capsule 0   albuterol (VENTOLIN HFA) 108 (90 Base) MCG/ACT inhaler Inhale into the lungs every 6 (six) hours as needed for wheezing or shortness of breath.     Calcium Carb-Cholecalciferol (CALCIUM-VITAMIN D3) 600-500 MG-UNIT CAPS Take 1 tablet by mouth daily.     cefdinir (OMNICEF) 300 MG capsule Take 1 capsule (300 mg total) by mouth 2 (two) times daily. 20 capsule 0   cetirizine (ZYRTEC) 10 MG tablet TAKE 1 TABLET BY MOUTH ONCE DAILY 30 tablet 0   clidinium-chlordiazePOXIDE (LIBRAX)  5-2.5 MG capsule TAKE (1) CAPSULE BY MOUTH 3 TIMES DAILY BEFORE MEALS. (Patient taking differently: prn) 90 capsule 0   clonazePAM (KLONOPIN) 0.5 MG tablet Take 1 tablet (0.5 mg total) by mouth 3 (three) times daily as needed. 90 tablet 2   cyclobenzaprine (FLEXERIL) 10 MG tablet Take 10 mg by mouth 3 (three) times daily.     DULoxetine (CYMBALTA) 60 MG capsule Take 1 capsule (60 mg total) by mouth 2 (two) times daily. 180 capsule 2   Erenumab-aooe (AIMOVIG) 140 MG/ML SOAJ INJECT 1 PEN INTO THE SKIN ONCE EVERY 28 DAYS. 1 mL 11   fluticasone (FLOVENT HFA) 220 MCG/ACT inhaler Inhale 2 puffs into the lungs 2 (two) times daily. Do not inhale, swallow powder and drink water , do not eat food for 20 minutes after puffs 4 each 2   levalbuterol (XOPENEX HFA) 45 MCG/ACT inhaler INHALE (2) PUFFS INTO THE LUNGS EVERY SIX HOURS AS NEEDED FOR WHEEZING. 15 g 3   levalbuterol (XOPENEX) 1.25 MG/3ML nebulizer solution Take 1.25 mg by nebulization every 4 (four) hours as needed for wheezing. 50 mL 3   linaclotide (LINZESS) 290 MCG CAPS capsule Take 1 capsule (290 mcg total) by mouth daily before breakfast. 90 capsule 3   pantoprazole (PROTONIX) 40 MG tablet Take 1 tablet (40 mg total) by mouth daily. 30 tablet 5   Rimegepant Sulfate (NURTEC) 75 MG TBDP TAKE 1 TABLET BY MOUTH AS NEEDED. MAX TWICE WEEKLY. 8 tablet 11   traZODone (DESYREL) 100 MG tablet Take 2 tablets (200 mg total) by mouth at bedtime. 60 tablet 2   valACYclovir (VALTREX) 1000 MG tablet TAKE 1 TABLET BY MOUTH DAILY. 30 tablet PRN   Vitamin D, Ergocalciferol, (DRISDOL) 1.25 MG (50000 UNIT) CAPS capsule Take 50,000 Units by mouth every Wednesday.     No current facility-administered medications for this visit.     Musculoskeletal: Strength & Muscle Tone: within normal limits Gait & Station: normal Patient leans: N/A  Psychiatric Specialty Exam: Review of Systems  Musculoskeletal:  Positive for arthralgias and back pain.  Neurological:  Positive  for tremors.  Psychiatric/Behavioral:  The patient is nervous/anxious.   All other systems reviewed and are negative.   There were no vitals taken for this visit.There is no height or weight on file to calculate BMI.  General Appearance: Casual and Fairly Groomed  Eye Contact:  Good  Speech:  Clear and Coherent  Volume:  Normal  Mood:  Anxious  Affect:  Appropriate and Congruent  Thought Process:  Goal Directed  Orientation:  Full (Time, Place, and Person)  Thought Content: Rumination   Suicidal Thoughts:  No  Homicidal Thoughts:  No  Memory:  Immediate;   Good Recent;   Good Remote;   Good  Judgement:  Good  Insight:  Fair  Psychomotor Activity:  Tremor  Concentration:  Concentration: Good and Attention  Span: Good  Recall:  Good  Fund of Knowledge: Good  Language: Good  Akathisia:  No  Handed:  Right  AIMS (if indicated): not done  Assets:  Communication Skills Desire for Improvement Resilience Social Support Talents/Skills  ADL's:  Intact  Cognition: WNL  Sleep:  Good   Screenings: AIMS    Flowsheet Row Admission (Discharged) from 06/19/2020 in [facility]  AIMS Total Score 0      AUDIT    Flowsheet Row Patient Outreach Telephone from 07/14/2020 in [facility] Coordination Admission (Discharged) from 06/19/2020 in [facility] ED to Hosp-Admission (Discharged) from 05/09/2015 in [facility]  Alcohol Use Disorder Identification Test Final Score (AUDIT) 1 1 0      GAD-7    Flowsheet Row Office Visit from 08/25/2021 in [facility] from 12/11/2018 in [facility] from 02/07/2017 in [facility]  Total GAD-7 Score _0 PHQ2-9    Flowsheet Row Video Visit from 09/20/2022 in [facility] Video Visit from 07/22/2022 in [facility] Office Visit from 06/29/2022 in [facility] Video Visit from 04/28/2022 in [facility] Video Visit from 02/18/2022 in [facility]  PHQ-2 Total Score _1 PHQ-9 Total Score 10 -- 18 -- 8      Flowsheet Row Video Visit from 09/20/2022 in [facility] Video Visit from 07/22/2022 in [facility] Video Visit from 04/28/2022 in [facility] No Risk No Risk No Risk        Assessment and Plan: This patient is a 36 year old female with a history anxiety depression ADHD borderline personality traits and insomnia.  Since going off pain medicine she has been more anxious so we will add propranolol 10 mg the 3 times daily to her regimen.  She is already taking clonazepam 0.5 mg 3 times daily as needed for anxiety.  She will continue Cymbalta 120 mg daily for depression and chronic pain and trazodone 150 to 200 mg at bedtime for sleep.  She will return to see me in 4 weeks  Collaboration of Care: Collaboration of Care: Primary Care Provider AEB notes are shared with PCP through the epic system  Patient/Guardian was advised Release of Information must be obtained prior to any record release in order to collaborate their care with an outside provider. Patient/Guardian was advised if they have not already done so to contact the registration department to sign all necessary forms in order for [name] to release information regarding their care.   Consent: Patient/Guardian gives verbal consent for treatment and assignment of benefits for services provided during this visit. Patient/Guardian expressed understanding and agreed to proceed.    [name], MD 11/18/2022,  11:13 AM

## 2022-11-25 ENCOUNTER — Encounter (HOSPITAL_COMMUNITY): Payer: Medicaid Other

## 2022-11-25 DIAGNOSIS — Z1231 Encounter for screening mammogram for malignant neoplasm of breast: Secondary | ICD-10-CM

## 2022-11-26 ENCOUNTER — Telehealth: Payer: Medicaid Other | Admitting: Physician Assistant

## 2022-11-26 DIAGNOSIS — R3989 Other symptoms and signs involving the genitourinary system: Secondary | ICD-10-CM

## 2022-11-26 DIAGNOSIS — B379 Candidiasis, unspecified: Secondary | ICD-10-CM | POA: Diagnosis not present

## 2022-11-26 DIAGNOSIS — H66003 Acute suppurative otitis media without spontaneous rupture of ear drum, bilateral: Secondary | ICD-10-CM | POA: Diagnosis not present

## 2022-11-26 DIAGNOSIS — B9689 Other specified bacterial agents as the cause of diseases classified elsewhere: Secondary | ICD-10-CM

## 2022-11-26 DIAGNOSIS — N760 Acute vaginitis: Secondary | ICD-10-CM | POA: Diagnosis not present

## 2022-11-26 DIAGNOSIS — T3695XA Adverse effect of unspecified systemic antibiotic, initial encounter: Secondary | ICD-10-CM

## 2022-11-26 MED ORDER — AMOXICILLIN-POT CLAVULANATE 875-125 MG PO TABS: 1.0000 | ORAL_TABLET | Freq: Two times a day (BID) | ORAL | 0 refills | Status: DC

## 2022-11-26 MED ORDER — FLUCONAZOLE 200 MG PO TABS: 200.0000 mg | ORAL_TABLET | ORAL | 0 refills | Status: DC | PRN

## 2022-11-26 MED ORDER — METRONIDAZOLE 500 MG PO TABS: 500.0000 mg | ORAL_TABLET | Freq: Two times a day (BID) | ORAL | 0 refills | Status: AC

## 2022-11-26 NOTE — Progress Notes (Signed)
Virtual Visit Consent   Penny [name], you are scheduled for a virtual visit with a [facility] provider today. Just as with appointments in the office, your consent must be obtained to participate. Your consent will be active for this visit and any virtual visit you may have with one of our providers in the next 365 days. If you have a MyChart account, a copy of this consent can be sent to you electronically.  As this is a virtual visit, video technology does not allow for your provider to perform a traditional examination. This may limit your provider's ability to fully assess your condition. If your provider identifies any concerns that need to be evaluated in person or the need to arrange testing (such as labs, EKG, etc.), we will make arrangements to do so. Although advances in technology are sophisticated, we cannot ensure that it will always work on either your end or our end. If the connection with a video visit is poor, the visit may have to be switched to a telephone visit. With either a video or telephone visit, we are not always able to ensure that we have a secure connection.  By engaging in this virtual visit, you consent to the provision of healthcare and authorize for your insurance to be billed (if applicable) for the services provided during this visit. Depending on your insurance coverage, you may receive a charge related to this service.  I need to obtain your verbal consent now. Are you willing to proceed with your visit today? Penny [name] has provided verbal consent on 11/26/2022 for a virtual visit (video or telephone). Mar Daring, PA-C  Date: 11/26/2022 9:57 AM  Virtual Visit via Video Note   I, Mar Daring, connected with  Penny [name]  ([id number], [birth year]) on 11/26/22 at  9:45 AM EST by a video-enabled telemedicine application and verified that I am speaking with the correct person using two identifiers.  Location: Patient: Virtual Visit Location  Patient: Home Provider: Virtual Visit Location Provider: Home Office   I discussed the limitations of evaluation and management by telemedicine and the availability of in person appointments. The patient expressed understanding and agreed to proceed.    History of Present Illness: Penny [name] is a 36 y.o. who identifies as a female who was assigned female at birth, and is being seen today for URI symptoms.  HPI: URI  This is a new problem. Episode onset: was treated for Strep throat on 10/27/22; daughter has current ear infection. The problem has been gradually worsening. Maximum temperature: subjective fever. Associated symptoms include congestion, ear pain, headaches, a plugged ear sensation, a sore throat and swollen glands. Pertinent negatives include no rhinorrhea. Associated symptoms comments: Having frequent urination, foul odor, vaginal discharge with foul odor. She has tried acetaminophen, decongestant, NSAIDs and increased fluids for the symptoms. The treatment provided no relief.     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   Absence of bladder continence 06/14/2022   Joint pain 04/01/2022   Chronic prescription opiate use 12/25/2020   Major depressive disorder, recurrent episode ([place]) 06/22/2020   Chronic abdominal pain 09/19/2019   Gastroesophageal reflux disease without esophagitis    Severe anxiety 08/21/2019   Urge incontinence 10/26/2018   S/P kyphoplasty 08/02/2018   Herpes genitalis in women 12/19/2017   Urinary tract infection with hematuria 03/30/2017   Family history of breast cancer in mother,uncertain BR CA status 02/28/2017  Chronic pain syndrome 12/05/2016   Folate deficiency 12/04/2016   Anastomotic ulcer 04/22/2016   Pain medication agreement signed 04/22/2016   Neuropathy 01/23/2016   Major depressive disorder, recurrent episode, severe with peripartum onset  ([place]) 05/10/2015   Fibromyalgia 02/18/2015   Vulvar fissure 02/18/2015   Current smoker 01/29/2015   Current tobacco use 01/29/2015   Sciatica of left side 11/14/2014   Psychogenic nonepileptic seizure 09/12/2014   Hypertension in pregnancy, transient 07/08/2014   Previous gastric bypass affecting pregnancy, antepartum 05/22/2014   History of bariatric surgery 05/22/2014   Patent foramen ovale with right to left shunt 05/17/2014   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   Rapid palpitations 02/18/2014   Supervision of pregnancy with other poor reproductive or obstetric history, unspecified trimester 02/18/2014   Restless legs 01/16/2014   Restless leg 01/16/2014   Chronic interstitial cystitis 10/23/2013   Depression with anxiety 03/10/2012    Class: Chronic   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 03/10/2012    Class: Chronic   Attention deficit hyperactivity disorder, 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    Class: Chronic   Panic disorder without agoraphobia 03/10/2012   H/O disease 03/10/2012   Dysthymia 03/10/2012   Conversion disorder with seizures or convulsions 03/10/2012   Other specified behavioral and emotional disorders with onset usually occurring in childhood and adolescence 03/10/2012   Pelvic congestion syndrome 10/13/2011   Chronic migraine without aura 10/13/2011   Other specified conditions associated with female genital organs and menstrual cycle 10/13/2011   IBS (irritable bowel syndrome) 08/25/2011   Iron deficiency anemia 07/23/2010    Allergies:  Allergies  Allergen Reactions   Gabapentin Other (See Comments)    Pt states that she was unresponsive after taking this medication, but her vitals remained stable.     Metoclopramide Hcl Anxiety and Other (See Comments)    Pt states that she felt like she was trapped in a box, and could not get out.  Pt also states that she had temporary loss of movement, weakness, and tingling.      Tramadol Other (See Comments)    Seizures   Ativan [Lorazepam] Other (See Comments)    combative   Latex Itching, Rash and Other (See Comments)    Burns   Lyrica [Pregabalin] Other (See Comments)    suicidal   Iron Other (See Comments)    PATIENT IS INTOLERANT TO ORAL IRON , SHE DOES NOT ABSORB IT.     Sulfa Antibiotics Hives   Butrans [Buprenorphine] Rash    Patch caused rash, didn't help with pain   Nucynta [Tapentadol] Nausea And Vomiting   Propofol Nausea And Vomiting   Tape Hives, Itching and Rash    Please use paper tape   Toradol [Ketorolac Tromethamine] Anxiety   Medications:  Current Outpatient Medications:    ADDERALL XR 30 MG 24 hr capsule, TAKE 1 CAPSULE BY MOUTH ONCE EVERY MORNING., Disp: 30 capsule, Rfl: 0   albuterol (VENTOLIN HFA) 108 (90 Base) MCG/ACT inhaler, Inhale into the lungs every 6 (six) hours as needed for wheezing or shortness of breath., Disp: , Rfl:    amoxicillin-clavulanate (AUGMENTIN) 875-125 MG tablet, Take 1 tablet by mouth 2 (two) times daily., Disp: 20 tablet, Rfl: 0   Calcium Carb-Cholecalciferol (CALCIUM-VITAMIN D3) 600-500 MG-UNIT CAPS, Take 1 tablet by mouth daily., Disp: , Rfl:    cefdinir (OMNICEF) 300 MG capsule, Take 1 capsule (300 mg total) by mouth 2 (two) times daily., Disp: 20 capsule, Rfl: 0  cetirizine (ZYRTEC) 10 MG tablet, TAKE 1 TABLET BY MOUTH ONCE DAILY, Disp: 30 tablet, Rfl: 0   clidinium-chlordiazePOXIDE (LIBRAX) 5-2.5 MG capsule, TAKE (1) CAPSULE BY MOUTH 3 TIMES DAILY BEFORE MEALS. (Patient taking differently: prn), Disp: 90 capsule, Rfl: 0   clonazePAM (KLONOPIN) 0.5 MG tablet, Take 1 tablet (0.5 mg total) by mouth 3 (three) times daily as needed., Disp: 90 tablet, Rfl: 2   cyclobenzaprine (FLEXERIL) 10 MG tablet, Take 10 mg by mouth 3 (three) times daily., Disp: , Rfl:    DULoxetine (CYMBALTA) 60 MG capsule, Take 1 capsule (60 mg total) by mouth 2 (two) times daily., Disp: 180 capsule, Rfl: 2   Erenumab-aooe (AIMOVIG) 140  MG/ML SOAJ, INJECT 1 PEN INTO THE SKIN ONCE EVERY 28 DAYS., Disp: 1 mL, Rfl: 11   fluconazole (DIFLUCAN) 200 MG tablet, Take 1 tablet (200 mg total) by mouth every 3 (three) days as needed., Disp: 3 tablet, Rfl: 0   fluticasone (FLOVENT HFA) 220 MCG/ACT inhaler, Inhale 2 puffs into the lungs 2 (two) times daily. Do not inhale, swallow powder and drink water , do not eat food for 20 minutes after puffs, Disp: 4 each, Rfl: 2   levalbuterol (XOPENEX HFA) 45 MCG/ACT inhaler, INHALE (2) PUFFS INTO THE LUNGS EVERY SIX HOURS AS NEEDED FOR WHEEZING., Disp: 15 g, Rfl: 3   levalbuterol (XOPENEX) 1.25 MG/3ML nebulizer solution, Take 1.25 mg by nebulization every 4 (four) hours as needed for wheezing., Disp: 50 mL, Rfl: 3   linaclotide (LINZESS) 290 MCG CAPS capsule, Take 1 capsule (290 mcg total) by mouth daily before breakfast., Disp: 90 capsule, Rfl: 3   metroNIDAZOLE (FLAGYL) 500 MG tablet, Take 1 tablet (500 mg total) by mouth 2 (two) times daily for 7 days., Disp: 14 tablet, Rfl: 0   pantoprazole (PROTONIX) 40 MG tablet, Take 1 tablet (40 mg total) by mouth daily., Disp: 30 tablet, Rfl: 5   propranolol (INDERAL) 10 MG tablet, Take 1 tablet (10 mg total) by mouth 3 (three) times daily., Disp: 90 tablet, Rfl: 2   Rimegepant Sulfate (NURTEC) 75 MG TBDP, TAKE 1 TABLET BY MOUTH AS NEEDED. MAX TWICE WEEKLY., Disp: 8 tablet, Rfl: 11   traZODone (DESYREL) 100 MG tablet, Take 2 tablets (200 mg total) by mouth at bedtime., Disp: 60 tablet, Rfl: 2   valACYclovir (VALTREX) 1000 MG tablet, TAKE 1 TABLET BY MOUTH DAILY., Disp: 30 tablet, Rfl: PRN   Vitamin D, Ergocalciferol, (DRISDOL) 1.25 MG (50000 UNIT) CAPS capsule, Take 50,000 Units by mouth every Wednesday., Disp: , Rfl:   Observations/Objective: Patient is well-developed, well-nourished in no acute distress.  Resting comfortably at home.  Head is normocephalic, atraumatic.  No labored breathing.  Speech is clear and coherent with logical content.  Patient is  alert and oriented at baseline.    Assessment and Plan: 1. Non-recurrent acute suppurative otitis media of both ears without spontaneous rupture of tympanic membranes - fluconazole (DIFLUCAN) 200 MG tablet; Take 1 tablet (200 mg total) by mouth every 3 (three) days as needed.  Dispense: 3 tablet; Refill: 0  2. Suspected UTI - amoxicillin-clavulanate (AUGMENTIN) 875-125 MG tablet; Take 1 tablet by mouth 2 (two) times daily.  Dispense: 20 tablet; Refill: 0  3. BV (bacterial vaginosis) - metroNIDAZOLE (FLAGYL) 500 MG tablet; Take 1 tablet (500 mg total) by mouth 2 (two) times daily for 7 days.  Dispense: 14 tablet; Refill: 0  4. Antibiotic-induced yeast infection - amoxicillin-clavulanate (AUGMENTIN) 875-125 MG tablet; Take 1 tablet by mouth 2 (  two) times daily.  Dispense: 20 tablet; Refill: 0  - Worsening symptoms that have not responded to OTC medications.  - Will give Augmentin for URI and UTI symptoms - Metronidazole for BV symptoms - Steam and humidifier can help - Stay well hydrated and get plenty of rest.  - Diflucan given as prophylaxis as patient tends to get vaginal yeast infections with antibiotic use - Probiotics for having to take 2 antibiotics - Seek in person evaluation if no symptom improvement or if symptoms worsen   Follow Up Instructions: I discussed the assessment and treatment plan with the patient. The patient was provided an opportunity to ask questions and all were answered. The patient agreed with the plan and demonstrated an understanding of the instructions.  A copy of instructions were sent to the patient via MyChart unless otherwise noted below.    The patient was advised to call back or seek an in-person evaluation if the symptoms worsen or if the condition fails to improve as anticipated.  Time:  I spent 15 minutes with the patient via telehealth technology discussing the above problems/concerns.    Mar Daring, PA-C

## 2022-11-26 NOTE — Patient Instructions (Addendum)
Penny [name], thank you for joining Mar Daring, PA-C for today's virtual visit.  While this provider is not your primary care provider (PCP), if your PCP is located in our provider database this encounter information will be shared with them immediately following your visit.   [facility] account gives you access to today's visit and all your visits, tests, and labs performed at [facility] " click here if you don't have a [facility] account or go to [email or web address]  Consent: (Patient) Penny [name] provided verbal consent for this virtual visit at the beginning of the encounter.  Current Medications:  Current Outpatient Medications:    ADDERALL XR 30 MG 24 hr capsule, TAKE 1 CAPSULE BY MOUTH ONCE EVERY MORNING., Disp: 30 capsule, Rfl: 0   albuterol (VENTOLIN HFA) 108 (90 Base) MCG/ACT inhaler, Inhale into the lungs every 6 (six) hours as needed for wheezing or shortness of breath., Disp: , Rfl:    amoxicillin-clavulanate (AUGMENTIN) 875-125 MG tablet, Take 1 tablet by mouth 2 (two) times daily., Disp: 20 tablet, Rfl: 0   Calcium Carb-Cholecalciferol (CALCIUM-VITAMIN D3) 600-500 MG-UNIT CAPS, Take 1 tablet by mouth daily., Disp: , Rfl:    cefdinir (OMNICEF) 300 MG capsule, Take 1 capsule (300 mg total) by mouth 2 (two) times daily., Disp: 20 capsule, Rfl: 0   cetirizine (ZYRTEC) 10 MG tablet, TAKE 1 TABLET BY MOUTH ONCE DAILY, Disp: 30 tablet, Rfl: 0   clidinium-chlordiazePOXIDE (LIBRAX) 5-2.5 MG capsule, TAKE (1) CAPSULE BY MOUTH 3 TIMES DAILY BEFORE MEALS. (Patient taking differently: prn), Disp: 90 capsule, Rfl: 0   clonazePAM (KLONOPIN) 0.5 MG tablet, Take 1 tablet (0.5 mg total) by mouth 3 (three) times daily as needed., Disp: 90 tablet, Rfl: 2   cyclobenzaprine (FLEXERIL) 10 MG tablet, Take 10 mg by mouth 3 (three) times daily., Disp: , Rfl:    DULoxetine (CYMBALTA) 60 MG capsule, Take 1 capsule (60 mg total) by mouth 2 (two) times daily.,  Disp: 180 capsule, Rfl: 2   Erenumab-aooe (AIMOVIG) 140 MG/ML SOAJ, INJECT 1 PEN INTO THE SKIN ONCE EVERY 28 DAYS., Disp: 1 mL, Rfl: 11   fluconazole (DIFLUCAN) 200 MG tablet, Take 1 tablet (200 mg total) by mouth every 3 (three) days as needed., Disp: 3 tablet, Rfl: 0   fluticasone (FLOVENT HFA) 220 MCG/ACT inhaler, Inhale 2 puffs into the lungs 2 (two) times daily. Do not inhale, swallow powder and drink water , do not eat food for 20 minutes after puffs, Disp: 4 each, Rfl: 2   levalbuterol (XOPENEX HFA) 45 MCG/ACT inhaler, INHALE (2) PUFFS INTO THE LUNGS EVERY SIX HOURS AS NEEDED FOR WHEEZING., Disp: 15 g, Rfl: 3   levalbuterol (XOPENEX) 1.25 MG/3ML nebulizer solution, Take 1.25 mg by nebulization every 4 (four) hours as needed for wheezing., Disp: 50 mL, Rfl: 3   linaclotide (LINZESS) 290 MCG CAPS capsule, Take 1 capsule (290 mcg total) by mouth daily before breakfast., Disp: 90 capsule, Rfl: 3   metroNIDAZOLE (FLAGYL) 500 MG tablet, Take 1 tablet (500 mg total) by mouth 2 (two) times daily for 7 days., Disp: 14 tablet, Rfl: 0   pantoprazole (PROTONIX) 40 MG tablet, Take 1 tablet (40 mg total) by mouth daily., Disp: 30 tablet, Rfl: 5   propranolol (INDERAL) 10 MG tablet, Take 1 tablet (10 mg total) by mouth 3 (three) times daily., Disp: 90 tablet, Rfl: 2   Rimegepant Sulfate (NURTEC) 75 MG TBDP, TAKE 1 TABLET BY MOUTH AS NEEDED. MAX TWICE WEEKLY., Disp:  8 tablet, Rfl: 11   traZODone (DESYREL) 100 MG tablet, Take 2 tablets (200 mg total) by mouth at bedtime., Disp: 60 tablet, Rfl: 2   valACYclovir (VALTREX) 1000 MG tablet, TAKE 1 TABLET BY MOUTH DAILY., Disp: 30 tablet, Rfl: PRN   Vitamin D, Ergocalciferol, (DRISDOL) 1.25 MG (50000 UNIT) CAPS capsule, Take 50,000 Units by mouth every Wednesday., Disp: , Rfl:    Medications ordered in this encounter:  Meds ordered this encounter  Medications   amoxicillin-clavulanate (AUGMENTIN) 875-125 MG tablet    Sig: Take 1 tablet by mouth 2 (two) times  daily.    Dispense:  20 tablet    Refill:  0    Order Specific Question:   Supervising Provider    Answer:   Chase Picket [[id number]]   metroNIDAZOLE (FLAGYL) 500 MG tablet    Sig: Take 1 tablet (500 mg total) by mouth 2 (two) times daily for 7 days.    Dispense:  14 tablet    Refill:  0    Order Specific Question:   Supervising Provider    Answer:   Chase Picket [[id number]]   fluconazole (DIFLUCAN) 200 MG tablet    Sig: Take 1 tablet (200 mg total) by mouth every 3 (three) days as needed.    Dispense:  3 tablet    Refill:  0    Order Specific Question:   Supervising Provider    Answer:   Chase Picket [id number]     *If you need refills on other medications prior to your next appointment, please contact your pharmacy*  Follow-Up: Call back or seek an in-person evaluation if the symptoms worsen or if the condition fails to improve as anticipated.  [place] [phone]  Other Instructions Bacterial Vaginosis  Bacterial vaginosis is an infection of the vagina. It happens when too many normal germs (healthy bacteria) grow in the vagina. This infection can make it easier to get other infections from sex (STIs). It is very important for pregnant women to get treated. This infection can cause babies to be born early or at a low birth weight. What are the causes? This infection is caused by an increase in certain germs that grow in the vagina. You cannot get this infection from toilet seats, bedsheets, swimming pools, or things that touch your vagina. What increases the risk? Having sex with a new person or more than one person. Having sex without protection. Douching. Having an intrauterine device (IUD). Smoking. Using drugs or drinking alcohol. These can lead you to do things that are risky. Taking certain antibiotic medicines. Being pregnant. What are the signs or symptoms? Some women have no symptoms. Symptoms may include: A discharge from your  vagina. It may be gray or white. It can be watery or foamy. A fishy smell. This can happen after sex or during your menstrual period. Itching in and around your vagina. A feeling of burning or pain when you pee (urinate). How is this treated? This infection is treated with antibiotic medicines. These may be given to you as: A pill. A cream for your vagina. A medicine that you put into your vagina (suppository). If the infection comes back after treatment, you may need more antibiotics. Follow these instructions at home: Medicines Take over-the-counter and prescription medicines as told by your doctor. Take or use your antibiotic medicine as told by your doctor. Do not stop taking or using it, even if you start to feel better. General instructions If  the person you have sex with is a woman, tell her that you have this infection. She will need to follow up with her doctor. If you have a female partner, he does not need to be treated. Do not have sex until you finish treatment. Drink enough fluid to keep your pee pale yellow. Keep your vagina and butt clean. Wash the area with warm water each day. Wipe from front to back after you use the toilet. If you are breastfeeding a baby, ask your doctor if you should keep doing so during treatment. Keep all follow-up visits. How is this prevented? Self-care Do not douche. Use only warm water to wash around your vagina. Wear underwear that is cotton or lined with cotton. Do not wear tight pants and pantyhose, especially in the summer. Safe sex Use protection when you have sex. This includes: Use condoms. Use dental dams. This is a thin layer that protects the mouth during oral sex. Limit how many people you have sex with. To prevent this infection, it is best to have sex with just one person. Get tested for STIs. The person you have sex with should also get tested. Drugs and alcohol Do not smoke or use any products that contain nicotine or  tobacco. If you need help quitting, ask your doctor. Do not use drugs. Do not drink alcohol if: Your doctor tells you not to drink. You are pregnant, may be pregnant, or are planning to become pregnant. If you drink alcohol: Limit how much you have to 0-1 drink a day. Know how much alcohol is in your drink. In the [place] one drink equals one 12 oz bottle of beer (355 mL), one 5 oz glass of wine (148 mL), or one 1 oz glass of hard liquor (44 mL). Where to find more information Centers for Disease Control and Prevention: http://www.wolf.info/ American Sexual Health Association: www.ashastd.org Office on [employer] Health: [place] Contact a doctor if: Your symptoms do not get better, even after you are treated. You have more discharge or pain when you pee. You have a fever or chills. You have pain in your belly (abdomen) or in the area between your hips. You have pain with sex. You bleed from your vagina between menstrual periods. Summary This infection can happen when too many germs (bacteria) grow in the vagina. This infection can make it easier to get infections from sex (STIs). Treating this can lower that chance. Get treated if you are pregnant. This infection can cause babies to be born early. Do not stop taking or using your antibiotic medicine, even if you start to feel better. This information is not intended to replace advice given to you by your health care provider. Make sure you discuss any questions you have with your health care provider. Document Revised: 05/22/2020 Document Reviewed: 05/22/2020 Elsevier Patient Education  [place].   Urinary Tract Infection, Adult A urinary tract infection (UTI) is an infection of any part of the urinary tract. The urinary tract includes: The kidneys. The ureters. The bladder. The urethra. These organs make, store, and get rid of pee (urine) in the body. What are the causes? This infection is caused by germs (bacteria) in  your genital area. These germs grow and cause swelling (inflammation) of your urinary tract. What increases the risk? The following factors may make you more likely to develop this condition: Using a small, thin tube (catheter) to drain pee. Not being able to control when you pee or poop (incontinence). Being  female. If you are female, these things can increase the risk: Using these methods to prevent pregnancy: A medicine that kills sperm (spermicide). A device that blocks sperm (diaphragm). Having low levels of a female hormone (estrogen). Being pregnant. You are more likely to develop this condition if: You have genes that add to your risk. You are sexually active. You take antibiotic medicines. You have trouble peeing because of: A prostate that is bigger than normal, if you are female. A blockage in the part of your body that drains pee from the bladder. A kidney stone. A nerve condition that affects your bladder. Not getting enough to drink. Not peeing often enough. You have other conditions, such as: Diabetes. A weak disease-fighting system (immune system). Sickle cell disease. Gout. Injury of the spine. What are the signs or symptoms? Symptoms of this condition include: Needing to pee right away. Peeing small amounts often. Pain or burning when peeing. Blood in the pee. Pee that smells bad or not like normal. Trouble peeing. Pee that is cloudy. Fluid coming from the vagina, if you are female. Pain in the belly or lower back. Other symptoms include: Vomiting. Not feeling hungry. Feeling mixed up (confused). This may be the first symptom in older adults. Being tired and grouchy (irritable). A fever. Watery poop (diarrhea). How is this treated? Taking antibiotic medicine. Taking other medicines. Drinking enough water. In some cases, you may need to see a specialist. Follow these instructions at home:  Medicines Take over-the-counter and prescription medicines  only as told by your doctor. If you were prescribed an antibiotic medicine, take it as told by your doctor. Do not stop taking it even if you start to feel better. General instructions Make sure you: Pee until your bladder is empty. Do not hold pee for a long time. Empty your bladder after sex. Wipe from front to back after peeing or pooping if you are a female. Use each tissue one time when you wipe. Drink enough fluid to keep your pee pale yellow. Keep all follow-up visits. Contact a doctor if: You do not get better after 1-2 days. Your symptoms go away and then come back. Get help right away if: You have very bad back pain. You have very bad pain in your lower belly. You have a fever. You have chills. You feeling like you will vomit or you vomit. Summary A urinary tract infection (UTI) is an infection of any part of the urinary tract. This condition is caused by germs in your genital area. There are many risk factors for a UTI. Treatment includes antibiotic medicines. Drink enough fluid to keep your pee pale yellow. This information is not intended to replace advice given to you by your health care provider. Make sure you discuss any questions you have with your health care provider. Document Revised: 07/04/2020 Document Reviewed: 07/04/2020 Elsevier Patient Education  [place].   Otitis Media, Adult  Otitis media is a condition in which the middle ear is red and swollen (inflamed) and full of fluid. The middle ear is the part of the ear that contains bones for hearing as well as air that helps send sounds to the brain. The condition usually goes away on its own. What are the causes? This condition is caused by a blockage in the eustachian tube. This tube connects the middle ear to the back of the nose. It normally allows air into the middle ear. The blockage is caused by fluid or swelling. Problems that can  cause blockage include: A cold or infection that affects the  nose, mouth, or throat. Allergies. An irritant, such as tobacco smoke. Adenoids that have become large. The adenoids are soft tissue located in the back of the throat, behind the nose and the roof of the mouth. Growth or swelling in the upper part of the throat, just behind the nose (nasopharynx). Damage to the ear caused by a change in pressure. This is called barotrauma. What increases the risk? You are more likely to develop this condition if you: Smoke or are exposed to tobacco smoke. Have an opening in the roof of your mouth (cleft palate). Have acid reflux. Have problems in your body's defense system (immune system). What are the signs or symptoms? Symptoms of this condition include: Ear pain. Fever. Problems with hearing. Being tired. Fluid leaking from the ear. Ringing in the ear. How is this treated? This condition can go away on its own within 3-5 days. But if the condition is caused by germs (bacteria) and does not go away on its own, or if it keeps coming back, your doctor may: Give you antibiotic medicines. Give you medicines for pain. Follow these instructions at home: Take over-the-counter and prescription medicines only as told by your doctor. If you were prescribed an antibiotic medicine, take it as told by your doctor. Do not stop taking it even if you start to feel better. Keep all follow-up visits. Contact a doctor if: You have bleeding from your nose. There is a lump on your neck. You are not feeling better in 5 days. You feel worse instead of better. Get help right away if: You have pain that is not helped with medicine. You have swelling, redness, or pain around your ear. You get a stiff neck. You cannot move part of your face (paralysis). You notice that the bone behind your ear hurts when you touch it. You get a very bad headache. Summary Otitis media means that the middle ear is red, swollen, and full of fluid. This condition usually goes away on  its own. If the problem does not go away, treatment may be needed. You may be given medicines to treat the infection or to treat your pain. If you were prescribed an antibiotic medicine, take it as told by your doctor. Do not stop taking it even if you start to feel better. Keep all follow-up visits. This information is not intended to replace advice given to you by your health care provider. Make sure you discuss any questions you have with your health care provider. Document Revised: 03/02/2021 Document Reviewed: 03/02/2021 Elsevier Patient Education  [place].    If you have been instructed to have an in-person evaluation today at a local Urgent Care facility, please use the link below. It will take you to a list of all of our available [facility], including address, phone number and hours of operation. Please do not delay care.  [facility]  If you or a family member do not have a primary care provider, use the link below to schedule a visit and establish care. When you choose a [facility] primary care physician or advanced practice provider, you gain a long-term partner in health. Find a Primary Care Provider  Learn more about [facility]'s in-office and virtual care options: [facility]

## 2022-11-30 ENCOUNTER — Encounter: Payer: Self-pay | Admitting: Hematology

## 2022-12-02 ENCOUNTER — Ambulatory Visit: Payer: Medicaid Other | Admitting: Adult Health

## 2022-12-07 ENCOUNTER — Encounter: Payer: Self-pay | Admitting: Hematology

## 2022-12-13 ENCOUNTER — Encounter: Payer: Self-pay | Admitting: Adult Health

## 2022-12-13 ENCOUNTER — Ambulatory Visit (INDEPENDENT_AMBULATORY_CARE_PROVIDER_SITE_OTHER): Payer: Medicaid Other | Admitting: Adult Health

## 2022-12-13 ENCOUNTER — Other Ambulatory Visit (HOSPITAL_COMMUNITY)
Admission: RE | Admit: 2022-12-13 | Discharge: 2022-12-13 | Disposition: A | Discharge status: Home / Self Care | Payer: Medicaid Other | Source: Ambulatory Visit | Attending: Adult Health | Admitting: Adult Health

## 2022-12-13 VITALS — BP 132/89 | HR 87 | Ht 66.0 in | Wt 160.0 lb

## 2022-12-13 DIAGNOSIS — R4589 Other symptoms and signs involving emotional state: Secondary | ICD-10-CM | POA: Diagnosis not present

## 2022-12-13 DIAGNOSIS — N898 Other specified noninflammatory disorders of vagina: Secondary | ICD-10-CM | POA: Insufficient documentation

## 2022-12-13 DIAGNOSIS — R102 Pelvic and perineal pain: Secondary | ICD-10-CM

## 2022-12-13 DIAGNOSIS — R61 Generalized hyperhidrosis: Secondary | ICD-10-CM | POA: Diagnosis not present

## 2022-12-13 DIAGNOSIS — R5383 Other fatigue: Secondary | ICD-10-CM

## 2022-12-13 NOTE — Progress Notes (Signed)
  Subjective:     Patient ID: Penny [name], female   DOB: 22-May-1986, 37 y.o.   MRN: [id number]  HPI Penny [name] is a 37 year old white female,divorced, [id number], in complaining of pelvic pain, esp LLQ, and vaginal discharge, with odor, denies any itching or burning. She also is complaining of night sweats,moody and fatigue. She has weaned off narcotics, she has chronic pain, fibromyalgia and OA and neuropathy she says.  Last pap was negative,NILM 08/25/21.  PCP is Dr [name].  Review of Systems +pelvic pain esp LLQ +vaginal discharge with odor Denies any itching or burning Having night sweats and moody +fatigue Not currently having sex Reviewed past medical,surgical, social and family history. Reviewed medications and allergies.     Objective:   Physical Exam BP 132/89 (BP Location: Right Arm, Patient Position: Sitting, Cuff Size: Normal)   Pulse 87   Ht '5\' 6"'$  (1.676 m)   Wt 160 lb (72.6 kg)   BMI 25.82 kg/m     Skin warm and dry.Pelvic: external genitalia is normal in appearance no lesions, vagina: white discharge without odor,urethra has no lesions or masses noted, cervix:smooth and bulbous, uterus: normal size, shape and contour, mildly tender, no masses felt, adnexa: no masses, + LLQ tenderness noted. Bladder is non tender and no masses felt. CV swab obtained. Fall risk is high  [facility] - 12/13/22 1109       Pregnancy Intention Screening   Does the patient want to become pregnant in the next year? No    Does the patient's partner want to become pregnant in the next year? No    Would the patient like to discuss contraceptive options today? No      Contraception Wrap Up   Current Method Female Sterilization    End Method Female Sterilization    Contraception Counseling Provided No             Assessment:     1. Pelvic pain +pain esp LLQ, did not get [name] in 2022 Pelvic US scheduled for 10/22/23 at 10:30 am at [facility]  - US PELVIC COMPLETE WITH TRANSVAGINAL;  Future  2. Vaginal discharge CV swab sent for GC/CHL,trich,BV and Yeast  - Cervicovaginal ancillary only( [facility])  3. Vaginal odor CV swab sent  - Cervicovaginal ancillary only( [facility])  4. Night sweats Will check TSH and FSH  - TSH - Follicle stimulating hormone  5. [place] Will check [place]  - Follicle stimulating hormone  6. Fatigue, unspecified type - CBC - Comprehensive metabolic panel - TSH     Plan:     Will talk when results back and follow up TBD

## 2022-12-14 ENCOUNTER — Other Ambulatory Visit: Payer: Self-pay | Admitting: Adult Health

## 2022-12-14 LAB — COMPREHENSIVE METABOLIC PANEL
ALT: 16 IU/L (ref 0–32)
AST: 23 IU/L (ref 0–40)
Albumin/Globulin Ratio: 1.6 (ref 1.2–2.2)
Albumin: 4.1 g/dL (ref 3.9–4.9)
Alkaline Phosphatase: 64 IU/L (ref 44–121)
BUN/Creatinine Ratio: 37 — ABNORMAL HIGH (ref 9–23)
BUN: 18 mg/dL (ref 6–20)
Bilirubin Total: 0.3 mg/dL (ref 0.0–1.2)
CO2: 21 mmol/L (ref 20–29)
Calcium: 8.5 mg/dL — ABNORMAL LOW (ref 8.7–10.2)
Chloride: 103 mmol/L (ref 96–106)
Creatinine, Ser: 0.49 mg/dL — ABNORMAL LOW (ref 0.57–1.00)
Globulin, Total: 2.5 g/dL (ref 1.5–4.5)
Glucose: 91 mg/dL (ref 70–99)
Potassium: 3.8 mmol/L (ref 3.5–5.2)
Sodium: 137 mmol/L (ref 134–144)
Total Protein: 6.6 g/dL (ref 6.0–8.5)
eGFR: 126 mL/min/{1.73_m2} (ref >59)

## 2022-12-14 LAB — CERVICOVAGINAL ANCILLARY ONLY
Bacterial Vaginitis (gardnerella): NEGATIVE
Candida Glabrata: POSITIVE — AB
Candida Vaginitis: NEGATIVE
Chlamydia: NEGATIVE
Comment: NEGATIVE
Comment: NEGATIVE
Comment: NEGATIVE
Comment: NEGATIVE
Comment: NEGATIVE
Comment: NORMAL
Neisseria Gonorrhea: NEGATIVE
Trichomonas: NEGATIVE

## 2022-12-14 LAB — CBC
Hematocrit: 35.7 % (ref 34.0–46.6)
Hemoglobin: 12.2 g/dL (ref 11.1–15.9)
MCH: 32.1 pg (ref 26.6–33.0)
MCHC: 34.2 g/dL (ref 31.5–35.7)
MCV: 94 fL (ref 79–97)
Platelets: 191 10*3/uL (ref 150–450)
RBC: 3.8 x10E6/uL (ref 3.77–5.28)
RDW: 11.8 % (ref 11.7–15.4)
WBC: 3.4 10*3/uL (ref 3.4–10.8)

## 2022-12-14 LAB — TSH: TSH: 0.825 u[IU]/mL (ref 0.450–4.500)

## 2022-12-14 LAB — FOLLICLE STIMULATING HORMONE: FSH: 6.1 m[IU]/mL

## 2022-12-14 MED ORDER — FLUCONAZOLE 100 MG PO TABS: 100.0000 mg | ORAL_TABLET | Freq: Every day | ORAL | 0 refills | Status: AC

## 2022-12-16 ENCOUNTER — Encounter (HOSPITAL_COMMUNITY): Payer: Self-pay | Admitting: Psychiatry

## 2022-12-16 ENCOUNTER — Telehealth (INDEPENDENT_AMBULATORY_CARE_PROVIDER_SITE_OTHER): Payer: Medicaid Other | Admitting: Psychiatry

## 2022-12-16 DIAGNOSIS — F411 Generalized anxiety disorder: Secondary | ICD-10-CM

## 2022-12-16 DIAGNOSIS — F900 Attention-deficit hyperactivity disorder, predominantly inattentive type: Secondary | ICD-10-CM

## 2022-12-16 DIAGNOSIS — F332 Major depressive disorder, recurrent severe without psychotic features: Secondary | ICD-10-CM | POA: Diagnosis not present

## 2022-12-16 MED ORDER — DULOXETINE HCL 60 MG PO CPEP: 60.0000 mg | ORAL_CAPSULE | Freq: Two times a day (BID) | ORAL | 2 refills | Status: DC

## 2022-12-16 MED ORDER — PROPRANOLOL HCL 10 MG PO TABS: 10.0000 mg | ORAL_TABLET | Freq: Three times a day (TID) | ORAL | 2 refills | Status: DC

## 2022-12-16 MED ORDER — AMPHETAMINE-DEXTROAMPHET ER 30 MG PO CP24: 30.0000 mg | ORAL_CAPSULE | Freq: Every day | ORAL | 0 refills | Status: DC

## 2022-12-16 MED ORDER — CLONAZEPAM 0.5 MG PO TABS: 0.5000 mg | ORAL_TABLET | Freq: Three times a day (TID) | ORAL | 2 refills | Status: DC | PRN

## 2022-12-16 MED ORDER — ADDERALL XR 30 MG PO CP24: ORAL_CAPSULE | ORAL | 0 refills | Status: AC

## 2022-12-16 MED ORDER — TRAZODONE HCL 100 MG PO TABS: 200.0000 mg | ORAL_TABLET | Freq: Every day | ORAL | 2 refills | Status: DC

## 2022-12-16 NOTE — Progress Notes (Signed)
Virtual Visit via Video Note  I connected with Penny [name] on 12/16/22 at  9:00 AM EST by a video enabled telemedicine application and verified that I am speaking with the correct person using two identifiers.  Location: Patient: home Provider: home office   I discussed the limitations of evaluation and management by telemedicine and the availability of in person appointments. The patient expressed understanding and agreed to proceed.     I discussed the assessment and treatment plan with the patient. The patient was provided an opportunity to ask questions and all were answered. The patient agreed with the plan and demonstrated an understanding of the instructions.   The patient was advised to call back or seek an in-person evaluation if the symptoms worsen or if the condition fails to improve as anticipated.  I provided 20 minutes of non-face-to-face time during this encounter.   [name], MD  [facility] MD/PA/NP OP Progress Note  12/16/2022 9:20 AM Penny [name]  MRN:  [id number]  Chief Complaint:  Chief Complaint  Patient presents with   Anxiety   Depression   ADD   HPI:  This patient is a 37 year old female who lives alone in [place].  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  She has 2 children with whom she shares custody with their fathers.   The patient returns for follow-up after 4 weeks regarding her ADD anxiety and depression.  She states that she remains off all pain medicines and has been off them for 2 months.  She still has chronic pain which is trying to deal with it and "natural ways."  Last time she stated she was having a lot of shaking is particularly in her hands which may have been a withdrawal.  I prescribed propranolol but she just now picked it up.  She states that shaking is subsiding.  She is trying hard to stay positive for the sake of her children and denies significant depression.  She continues to focus well with the ADHD.  She denies any  thoughts of suicide or self-harm  Visit Diagnosis:    ICD-10-CM   1. GAD (generalized anxiety disorder)  F41.1     2. Attention deficit hyperactivity disorder (ADHD), predominantly inattentive type  F90.0     3. Major depressive disorder, recurrent episode, 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[place])  F33.2       Past Psychiatric History: Long-term outpatient treatment, hospitalization last year for depression  Past Medical History:  Past Medical History:  Diagnosis Date   ADD (attention deficit disorder)    Anemia 2011   2o to GASTRIC BYPASS   Anginal pain ([place])    Anxiety    Asthma    Blood transfusion without reported diagnosis    Chronic daily headache    Depression    Depression    Dysmenorrhea 09/18/2013   Dysrhythmia    Elevated liver enzymes JUL 2011ALK PHOS 111-127 AST  143-267 ALT  213-321T BILI 0.6  ALB  3.7-4.06 Jun 2011 ALK PHOS 118 AST 24 ALT 42 T BILI 0.4 ALB 3.9   Encounter for drug rehabilitation    at behavioral health for opioid addiction about 4 years ago   Family history of adverse reaction to anesthesia    'dad had to be kept on pump for breathing for morphine'   Fatty liver    Fibroid 01/18/2017   Fibromyalgia    Gastric bypass status for obesity    Gastritis JULY 2011  Heart murmur    Hereditary and idiopathic peripheral neuropathy 01/15/2015   History of Holter monitoring    Hx of opioid abuse ([place])    for about 4 years, about 4 years ago   Interstitial cystitis    Iron deficiency anemia 07/23/2010   Irritable bowel syndrome 2012 DIARRHEA   JUN 2012 TTG IgA 14.9   IUD FEB 2010   Lupus ([place])    Menorrhagia 07/18/2013   Migraines    Obesity (BMI 30-39.9) 2011 228 LBS BMI 36.8   Osteoporosis    Ovarian cyst    Patient desires pregnancy 09/18/2013   Polyneuropathy    PONV (postoperative nausea and vomiting) seizure post-operatively   seizure following ankle surgery   Potassium (K) deficiency    Pregnant 12/25/2013   Psychiatric pseudoseizure     RLQ abdominal pain 07/18/2013   Sciatica of left side 11/14/2014   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   Stress 09/18/2013    Past Surgical History:  Procedure Laterality Date   BIOPSY  04/10/2015   Procedure: BIOPSY;  Surgeon: Daneil Dolin, MD;  Location: [facility];  Service: Endoscopy;;   BIOPSY  04/15/2021   Procedure: BIOPSY;  Surgeon: Montez Morita, Quillian Quince, MD;  [facility];  Service: Gastroenterology;;   cath self every nite     for sodium bicarb injection (discontinued 2013)   CHOLECYSTECTOMY  2005   biliary dyskinesia   COLONOSCOPY  JUN 2012 ABD PN/DIARRHEA WITH PROPOFOL   NL COLON   COLONOSCOPY WITH PROPOFOL N/A 01/16/2021   Procedure: COLONOSCOPY WITH PROPOFOL;  Surgeon: Harvel Quale, MD;  [facility];  Service: Gastroenterology;  Laterality: N/A;  1:45   DILATION AND CURETTAGE OF UTERUS     DILITATION & CURRETTAGE/HYSTROSCOPY WITH NOVASURE ABLATION N/A 03/24/2017   Procedure: DILATATION & CURETTAGE/HYSTEROSCOPY WITH NOVASURE ENDOMETRIAL ABLATION;  Surgeon: Jonnie Kind, MD;  Location: [facility];  Service: Gynecology;  Laterality: N/A;   ESOPHAGEAL DILATION N/A 04/10/2015   Procedure: ESOPHAGEAL DILATION WITH 54FR MALONEY DILATOR;  Surgeon: Daneil Dolin, MD;  Location: [facility];  Service: Endoscopy;  Laterality: N/A;   ESOPHAGOGASTRODUODENOSCOPY     ESOPHAGOGASTRODUODENOSCOPY (EGD) WITH PROPOFOL N/A 04/10/2015   Procedure: ESOPHAGOGASTRODUODENOSCOPY (EGD) WITH PROPOFOL;  Surgeon: Daneil Dolin, MD;  [facility];  Service: Endoscopy;  Laterality: N/A;   ESOPHAGOGASTRODUODENOSCOPY (EGD) WITH PROPOFOL N/A 12/06/2016   Procedure: ESOPHAGOGASTRODUODENOSCOPY (EGD) WITH PROPOFOL;  Surgeon: Danie Binder, MD;  [facility];  Service: Endoscopy;  Laterality: N/A;   ESOPHAGOGASTRODUODENOSCOPY (EGD) WITH PROPOFOL N/A 04/27/2019   Procedure: ESOPHAGOGASTRODUODENOSCOPY (EGD) WITH PROPOFOL;  Surgeon: Rogene Houston, MD;  [facility];  Service: Endoscopy;  Laterality: N/A;  730   ESOPHAGOGASTRODUODENOSCOPY (EGD) WITH PROPOFOL N/A 08/31/2019   Procedure: ESOPHAGOGASTRODUODENOSCOPY (EGD) WITH PROPOFOL;  Surgeon: Rogene Houston, MD;  [facility];  Service: Endoscopy;  Laterality: N/A;   ESOPHAGOGASTRODUODENOSCOPY (EGD) WITH PROPOFOL N/A 01/16/2021   Procedure: ESOPHAGOGASTRODUODENOSCOPY (EGD) WITH PROPOFOL;  Surgeon: Harvel Quale, MD;  [facility];  Service: Gastroenterology;  Laterality: N/A;   ESOPHAGOGASTRODUODENOSCOPY (EGD) WITH PROPOFOL N/A 04/15/2021   Procedure: ESOPHAGOGASTRODUODENOSCOPY (EGD) WITH PROPOFOL;  Surgeon: Harvel Quale, MD;  [facility];  Service: Gastroenterology;  Laterality: N/A;  9:00 AM   GAB  2007   in [place]-POUCH 5 CM   GASTRIC BYPASS  06/2006   HYSTEROSCOPY WITH D & C N/A 09/12/2014   Procedure: DILATATION AND  CURETTAGE /HYSTEROSCOPY;  Surgeon: Jonnie Kind, MD;  Location: [facility];  Service: Gynecology;  Laterality: N/A;   KYPHOPLASTY N/A 08/02/2018   Procedure: KYPHOPLASTY T12;  Surgeon: Melina Schools, MD;  Location: [place];  Service: Orthopedics;  Laterality: N/A;  60 mins   LAPAROSCOPIC TUBAL LIGATION Bilateral 03/24/2017   Procedure: LAPAROSCOPIC TUBAL LIGATION ([place] Rings);  Surgeon: Jonnie Kind, MD;  Location: [facility];  Service: Gynecology;  Laterality: Bilateral;   REPAIR VAGINAL CUFF N/A 07/30/2014   Procedure: REPAIR VAGINAL CUFF;  Surgeon: Mora Bellman, MD;  Location: [facility];  Service: Gynecology;  Laterality: N/A;   SAVORY DILATION  06/20/2012   Dr. Barnie Alderman gastritis/Ulcer in the mid jejunum. Empiric dilation.    SURAL NERVE BX Left 02/25/2016   Procedure: LEFT SURAL NERVE BIOPSY;  Surgeon: Jovita Gamma, MD;  Location: [place] [facility];  Service: Neurosurgery;  Laterality: Left;  Left sural nerve biopsy   TONSILLECTOMY     TONSILLECTOMY AND ADENOIDECTOMY     UPPER GASTROINTESTINAL ENDOSCOPY  JULY 2011  NAUSEA-D125,V6, PH 25   Bx; GASTRITIS, POUCH-5 CM LONG   WISDOM TOOTH EXTRACTION      Family Psychiatric History: See below  Family History:  Family History  Problem Relation Age of Onset   Coronary artery disease Paternal Grandfather    Migraines Paternal Grandmother    Breast cancer Paternal Grandmother    Hemochromatosis Maternal Grandmother    Migraines Maternal Grandmother    Cancer Maternal Grandmother    Breast cancer Maternal Grandmother    Hypertension Father    Diabetes Father    Coronary artery disease Father    Cancer Mother        breast   Hemochromatosis Mother    Breast cancer Mother    Depression Mother    Anxiety disorder Mother    Anxiety disorder Brother    Bipolar disorder Brother    Healthy Daughter    Healthy Son     Social History:  Social History   Socioeconomic History   Marital status: Divorced    Spouse name: Not on file   Number of children: 2   Years of education: some college   Highest education level: Some college, no degree  Occupational History   Occupation: Applying for Social Security Disability    Employer: NOT EMPLOYED  Tobacco Use   Smoking status: Former    Packs/day: 0.25    Years: 6.00    Total pack years: 1.50    Types: Cigarettes    Start date: 05/2018   Smokeless tobacco: Never  Vaping Use   Vaping Use: Every day  Substance and Sexual Activity   Alcohol use: Not Currently   Drug use: Yes    Types: Marijuana   Sexual activity: Yes    Partners: Male    Birth control/protection: Surgical    Comment: tubal and ablation  Other Topics Concern   Not on file  Social History Narrative   Patient is right handed.   Patient drinks one cup of coffee daily.   She lives in a one-story home with her two children (24 year old son, 80 month old daughter).   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 [employer]: Medium Risk  (08/25/2021)   Overall Financial Resource Strain (CARDIA)    Difficulty of Paying Living Expenses: Somewhat hard  Food Insecurity: Food Insecurity Present (08/25/2021)  Hunger Vital Sign    Worried About Running Out of Food in the Last Year: Sometimes true    Ran Out of Food in the Last Year: Sometimes true  Transportation Needs: No Transportation Needs (08/25/2021)   PRAPARE - Hydrologist (Medical): No    Lack of Transportation (Non-Medical): No  Physical Activity: Inactive (08/25/2021)   Exercise Vital Sign    Days of Exercise per Week: 0 days    Minutes of Exercise per Session: 10 min  Stress: Stress Concern Present (08/25/2021)   [place]    Feeling of Stress : Very much  Social Connections: Moderately Isolated (08/25/2021)   Social Connection and Isolation Panel [NHANES]    Frequency of Communication with Friends and Family: More than three times a week    Frequency of Social Gatherings with Friends and Family: Once a week    Attends Religious Services: 1 to 4 times per year    Active Member of [employer] or Organizations: No    Attends Archivist Meetings: Never    Marital Status: Divorced    Allergies:  Allergies  Allergen Reactions   Gabapentin Other (See Comments)    Pt states that she was unresponsive after taking this medication, but her vitals remained stable.     Metoclopramide Hcl Anxiety and Other (See Comments)    Pt states that she felt like she was trapped in a box, and could not get out.  Pt also states that she had temporary loss of movement, weakness, and tingling.     Tramadol Other (See Comments)    Seizures   Ativan [Lorazepam] Other (See Comments)    combative   Latex Itching, Rash and Other (See Comments)    Burns   Lyrica [Pregabalin] Other (See Comments)    suicidal   Iron Other (See Comments)    PATIENT IS INTOLERANT TO ORAL IRON , SHE DOES NOT  ABSORB IT.     Sulfa Antibiotics Hives   Butrans [Buprenorphine] Rash    Patch caused rash, didn't help with pain   Nucynta [Tapentadol] Nausea And Vomiting   Propofol Nausea And Vomiting   Tape Hives, Itching and Rash    Please use paper tape   Toradol [Ketorolac Tromethamine] Anxiety    Metabolic Disorder Labs: Lab Results  Component Value Date   HGBA1C 5.7 (H) 01/15/2015   Lab Results  Component Value Date   PROLACTIN 80.0 (H) 05/09/2015   Lab Results  Component Value Date   CHOL 95 03/10/2012   TRIG 50 03/10/2012   HDL 38 (L) 03/10/2012   CHOLHDL 2.5 03/10/2012   VLDL 10 03/10/2012   LDLCALC 47 03/10/2012   Lab Results  Component Value Date   TSH 0.825 12/13/2022   TSH 1.350 01/01/2021    Therapeutic Level Labs: No results found for: "LITHIUM" Lab Results  Component Value Date   VALPROATE 66 06/23/2020   Lab Results  Component Value Date   CBMZ 8.0 06/14/2017   CBMZ 6.8 02/24/2016    Current Medications: Current Outpatient Medications  Medication Sig Dispense Refill   amphetamine-dextroamphetamine (ADDERALL XR) 30 MG 24 hr capsule Take 1 capsule (30 mg total) by mouth daily. 30 capsule 0   fluconazole (DIFLUCAN) 100 MG tablet Take 1 tablet (100 mg total) by mouth daily for 10 days. 10 tablet 0   ADDERALL XR 30 MG 24 hr capsule TAKE 1 CAPSULE BY MOUTH ONCE EVERY  MORNING. 30 capsule 0   albuterol (VENTOLIN HFA) 108 (90 Base) MCG/ACT inhaler Inhale into the lungs every 6 (six) hours as needed for wheezing or shortness of breath.     Calcium Carb-Cholecalciferol (CALCIUM-VITAMIN D3) 600-500 MG-UNIT CAPS Take 1 tablet by mouth daily.     cetirizine (ZYRTEC) 10 MG tablet TAKE 1 TABLET BY MOUTH ONCE DAILY 30 tablet 0   clidinium-chlordiazePOXIDE (LIBRAX) 5-2.5 MG capsule TAKE (1) CAPSULE BY MOUTH 3 TIMES DAILY BEFORE MEALS. (Patient taking differently: prn) 90 capsule 0   clonazePAM (KLONOPIN) 0.5 MG tablet Take 1 tablet (0.5 mg total) by mouth 3 (three) times  daily as needed. 90 tablet 2   cyclobenzaprine (FLEXERIL) 10 MG tablet Take 10 mg by mouth 3 (three) times daily.     DULoxetine (CYMBALTA) 60 MG capsule Take 1 capsule (60 mg total) by mouth 2 (two) times daily. 180 capsule 2   Erenumab-aooe (AIMOVIG) 140 MG/ML SOAJ INJECT 1 PEN INTO THE SKIN ONCE EVERY 28 DAYS. 1 mL 11   fluticasone (FLOVENT HFA) 220 MCG/ACT inhaler Inhale 2 puffs into the lungs 2 (two) times daily. Do not inhale, swallow powder and drink water , do not eat food for 20 minutes after puffs 4 each 2   levalbuterol (XOPENEX HFA) 45 MCG/ACT inhaler INHALE (2) PUFFS INTO THE LUNGS EVERY SIX HOURS AS NEEDED FOR WHEEZING. 15 g 3   linaclotide (LINZESS) 290 MCG CAPS capsule Take 1 capsule (290 mcg total) by mouth daily before breakfast. 90 capsule 3   ondansetron (ZOFRAN-ODT) 8 MG disintegrating tablet Take 8 mg by mouth every 8 (eight) hours as needed.     pantoprazole (PROTONIX) 40 MG tablet Take 1 tablet (40 mg total) by mouth daily. 30 tablet 5   propranolol (INDERAL) 10 MG tablet Take 1 tablet (10 mg total) by mouth 3 (three) times daily. 90 tablet 2   Rimegepant Sulfate (NURTEC) 75 MG TBDP TAKE 1 TABLET BY MOUTH AS NEEDED. MAX TWICE WEEKLY. 8 tablet 11   tiZANidine (ZANAFLEX) 2 MG tablet Take 2 mg by mouth 3 (three) times daily as needed.     traZODone (DESYREL) 100 MG tablet Take 2 tablets (200 mg total) by mouth at bedtime. 60 tablet 2   valACYclovir (VALTREX) 1000 MG tablet TAKE 1 TABLET BY MOUTH DAILY. 30 tablet PRN   Vitamin D, Ergocalciferol, (DRISDOL) 1.25 MG (50000 UNIT) CAPS capsule Take 50,000 Units by mouth every Wednesday.     No current facility-administered medications for this visit.     Musculoskeletal: Strength & Muscle Tone: within normal limits Gait & Station: normal Patient leans: N/A  Psychiatric Specialty Exam: Review of Systems  Musculoskeletal:  Positive for back pain.  Neurological:  Positive for tremors.  Psychiatric/Behavioral:  The patient is  nervous/anxious.   All other systems reviewed and are negative.   There were no vitals taken for this visit.There is no height or weight on file to calculate BMI.  General Appearance: Casual and Fairly Groomed  Eye Contact:  Good  Speech:  Clear and Coherent  Volume:  Normal  Mood:  Anxious and Euthymic  Affect:  Congruent  Thought Process:  Goal Directed  Orientation:  Full (Time, Place, and Person)  Thought Content: WDL   Suicidal Thoughts:  No  Homicidal Thoughts:  No  Memory:  Immediate;   Good Recent;   Good Remote;   Good  Judgement:  Good  Insight:  Fair  Psychomotor Activity:  Tremor  Concentration:  Concentration: Good and  Attention Span: Good  Recall:  Good  Fund of Knowledge: Good  Language: Good  Akathisia:  No  Handed:  Right  AIMS (if indicated): not done  Assets:  Communication Skills Desire for Improvement Resilience Social Support Talents/Skills  ADL's:  Intact  Cognition: WNL  Sleep:  Good   Screenings: AIMS    Flowsheet Row Admission (Discharged) from 06/19/2020 in [facility]  AIMS Total Score 0      AUDIT    Flowsheet Row Patient Outreach Telephone from 07/14/2020 in [facility] Coordination Admission (Discharged) from 06/19/2020 in [facility] ED to Hosp-Admission (Discharged) from 05/09/2015 in [facility]  Alcohol Use Disorder Identification Test Final Score (AUDIT) 1 1 0      GAD-7    Flowsheet Row Office Visit from 08/25/2021 in [facility] from 12/11/2018 in [facility] from 02/07/2017 in [facility]  Total GAD-7 Score '13 19 17      '$ PHQ2-9    Flowsheet Row Video Visit from 09/20/2022 in [facility] Video Visit from 07/22/2022 in [facility] Office Visit from 06/29/2022 in [facility] Video Visit from 04/28/2022 in [facility] Video Visit from 02/18/2022 in [facility]  PHQ-2 Total Score '4 1 3 1 2  '$ PHQ-9 Total Score 10 -- 18 -- 8      Flowsheet Row Video Visit from 09/20/2022 in [facility] Video Visit from 07/22/2022 in [facility] Video Visit from 04/28/2022 in [facility] No Risk No Risk No Risk        Assessment and Plan: This patient is a 37 year old female with a history of anxiety depression ADHD borderline personality traits.  She seems to be settling down now that she is 2 months being off narcotics.  She still has some mild tremor.  She has been given propranolol 10 mg to take 3 times daily as needed.  For now she will continue clonazepam 0.5 mg 3 times daily as needed for anxiety, Cymbalta 120 mg daily for depression and chronic pain Adderall xr 30 mg every morning for ADD and trazodone 150 to 200 mg at bedtime for sleep.  She will return to see me in 2 months  Collaboration of Care: Collaboration of Care: Primary Care Provider AEB notes are shared with PCP on the epic system  Patient/Guardian was advised Release of Information must be obtained prior to any record release in order to collaborate their care with an outside provider. Patient/Guardian was advised if they have not already done so to contact the registration department to sign all necessary forms in order for [name] to release information regarding their care.   Consent: Patient/Guardian gives verbal consent for treatment and assignment of benefits for services provided during this visit. Patient/Guardian expressed understanding and agreed to proceed.    [name], MD 12/16/2022, 9:20  AM

## 2022-12-21 ENCOUNTER — Other Ambulatory Visit: Payer: Self-pay | Admitting: *Deleted

## 2022-12-21 ENCOUNTER — Ambulatory Visit (HOSPITAL_COMMUNITY)
Admission: RE | Admit: 2022-12-21 | Discharge: 2022-12-21 | Disposition: A | Discharge status: Home / Self Care | Payer: Medicaid Other | Source: Ambulatory Visit | Attending: Adult Health | Admitting: Adult Health

## 2022-12-21 DIAGNOSIS — R102 Pelvic and perineal pain: Secondary | ICD-10-CM | POA: Insufficient documentation

## 2022-12-21 DIAGNOSIS — D500 Iron deficiency anemia secondary to blood loss (chronic): Secondary | ICD-10-CM

## 2022-12-25 ENCOUNTER — Other Ambulatory Visit: Payer: Self-pay | Admitting: Family Medicine

## 2023-01-03 ENCOUNTER — Encounter: Payer: Self-pay | Admitting: Family Medicine

## 2023-01-03 MED ORDER — OSELTAMIVIR PHOSPHATE 75 MG PO CAPS: 75.0000 mg | ORAL_CAPSULE | Freq: Every day | ORAL | 0 refills | Status: DC

## 2023-01-03 NOTE — Telephone Encounter (Signed)
Continuation of previous note  When utilizing Tamiflu for prophylactic purposes it is 1/day generally meant for individuals who are significantly immunocompromise. With [place] she would have 2 choices  Choice #1 Tamiflu 75 mg 1 daily for 10 days as a prophylactic but if she starts showing signs of flu she would need to shift that over to taking twice a day until it was finished  Option #2 giving this some time then if she starts showing signs of the flu to initiate twice daily for 5 days  Please communicate with [place]-if she chooses to have the medicine then May send in Tamiflu 75 mg 1 daily, #10, prophylactic

## 2023-01-03 NOTE — Telephone Encounter (Signed)
Discussed provider recommendations with patient and she would like the prophylactic dosing of Tamiflu sent to [facility].  Prescription sent electronically to the pharmacy.  Patient aware.

## 2023-01-03 NOTE — Telephone Encounter (Signed)
Nurses In most situations we recommend treating if having symptoms in keeping relatively self isolated. She would need to know that if she did start Tamiflu 1/day to try to prevent getting the flu that if she started having symptoms of the flu then

## 2023-01-05 ENCOUNTER — Inpatient Hospital Stay: Payer: Medicaid Other

## 2023-01-08 NOTE — Progress Notes (Deleted)
[place], [place] [address]   CLINIC:  Medical Oncology/Hematology  PCP:  Kathyrn Drown, MD [address] [place] [address] [phone]   REASON FOR VISIT:  Follow-up for iron deficiency anemia  CURRENT THERAPY: Intermittent IV iron infusions  INTERVAL HISTORY:   Penny [name] 37 y.o. female returns for routine follow-up of ***.  She received Feraheme on 07/12/2022, but was no-show x 3 for the other Feraheme infusions that had been ordered for her.  *** Bleeding (including periods) *** Fatigue *** Pica, RLS, headaches *** CP, DOE, LH, syncope  She has ***% energy and ***% appetite. She endorses that she is maintaining a stable weight.  ASSESSMENT & PLAN:  1.  Iron deficiency anemia - History of gastric bypass surgery in 2007 - Requiring intermittent IV iron since 2009, last Feraheme on 07/12/2022 Side effects/hypersensitivity on 07/12/2022: Infusion run slowly over 1 hour due to past intolerance.  Received premedication with IV Solu-Medrol and famotidine.  Patient reported headache, seeing black spots, and nausea (reports she is prone to migraines).  Symptoms improved after 30-minute wait time without any additional medications given. - EGD (04/15/2021): Pepper mesh appearance of mucosa in the esophagus. Gastric bypass with about 5 cm in length and intact staple line. GJ anastomosis with healthy mucosa. Normal jejunum. - Colonoscopy (01/16/2021): Preparation of the colon inadequate. Hemorrhoids, no large masses/polyps/old blood. - She is on oral B12 and vitamin D2 pills since gastric bypass surgery. - Most recent labs *** - PLAN: *** - Requires PREMEDICATION due to prior intolerance of iron, as well as Compazine due to nausea with IV iron infusions.  2.  Social/family history: - She is a single mother.  She used to work for EMS/fire department.  In 2019, she stopped working due to work-related injury.  She is non-smoker. -  Maternal grandmother had hemochromatosis and breast cancer.  Mother had hemochromatosis and breast cancer.  Maternal grandfather had kidney cancer.  Maternal uncle had liver cancer.  PLAN SUMMARY: >> *** >> *** >> ***   [facility] **   You were seen today by Tarri Abernethy PA-C for your ***.    *** ***  *** ***  LABS: Return in ***   OTHER TESTS: ***  MEDICATIONS: ***  FOLLOW-UP APPOINTMENT: ***     REVIEW OF SYSTEMS: ***  Review of Systems - Oncology   PHYSICAL EXAM:  ECOG PERFORMANCE STATUS: {CHL ONC ECOG [id number] *** There were no vitals filed for this visit. There were no vitals filed for this visit. Physical Exam  PAST MEDICAL/SURGICAL HISTORY:  Past Medical History:  Diagnosis Date   ADD (attention deficit disorder)    Anemia 2011   2o to GASTRIC BYPASS   Anginal pain ([place])    Anxiety    Asthma    Blood transfusion without reported diagnosis    Chronic daily headache    Depression    Depression    Dysmenorrhea 09/18/2013   Dysrhythmia    Elevated liver enzymes JUL 2011ALK PHOS 111-127 AST  143-267 ALT  213-321T BILI 0.6  ALB  3.7-4.06 Jun 2011 ALK PHOS 118 AST 24 ALT 42 T BILI 0.4 ALB 3.9   Encounter for drug rehabilitation    at behavioral health for opioid addiction about 4 years ago   Family history of adverse reaction to anesthesia    'dad had to be  kept on pump for breathing for morphine'   Fatty liver    Fibroid 01/18/2017   Fibromyalgia    Gastric bypass status for obesity    Gastritis JULY 2011   Heart murmur    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   History of Holter monitoring    Hx of opioid abuse ([place])    for about 4 years, about 4 years ago   Interstitial cystitis    Iron deficiency anemia 07/23/2010   Irritable bowel syndrome 2012 DIARRHEA   JUN 2012 TTG IgA 14.9   IUD FEB 2010   Lupus ([place])    Menorrhagia 07/18/2013    Migraines    Obesity (BMI 30-39.9) 2011 228 LBS BMI 36.8   Osteoporosis    Ovarian cyst    Patient desires pregnancy 09/18/2013   Polyneuropathy    PONV (postoperative nausea and vomiting) seizure post-operatively   seizure following ankle surgery   Potassium (K) deficiency    Pregnant 12/25/2013   Psychiatric pseudoseizure    RLQ abdominal pain 07/18/2013   Sciatica of left side 11/14/2014   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   Stress 09/18/2013   Past Surgical History:  Procedure Laterality Date   BIOPSY  04/10/2015   Procedure: BIOPSY;  Surgeon: Daneil Dolin, MD;  [facility]: [facility];  Service: Endoscopy;;   BIOPSY  04/15/2021   Procedure: BIOPSY;  Surgeon: Montez Morita, Quillian Quince, MD;  [facility];  Service: Gastroenterology;;   cath self every nite     for sodium bicarb injection (discontinued 2013)   CHOLECYSTECTOMY  2005   biliary dyskinesia   COLONOSCOPY  JUN 2012 ABD PN/DIARRHEA WITH PROPOFOL   NL COLON   COLONOSCOPY WITH PROPOFOL N/A 01/16/2021   Procedure: COLONOSCOPY WITH PROPOFOL;  Surgeon: Harvel Quale, MD;  [facility];  Service: Gastroenterology;  Laterality: N/A;  1:45   DILATION AND CURETTAGE OF UTERUS     DILITATION & CURRETTAGE/HYSTROSCOPY WITH NOVASURE ABLATION N/A 03/24/2017   Procedure: DILATATION & CURETTAGE/HYSTEROSCOPY WITH NOVASURE ENDOMETRIAL ABLATION;  Surgeon: Jonnie Kind, MD;  Location: [facility];  Service: Gynecology;  Laterality: N/A;   ESOPHAGEAL DILATION N/A 04/10/2015   Procedure: ESOPHAGEAL DILATION WITH 54FR MALONEY DILATOR;  Surgeon: Daneil Dolin, MD;  Location: [facility];  Service: Endoscopy;  Laterality: N/A;   ESOPHAGOGASTRODUODENOSCOPY     ESOPHAGOGASTRODUODENOSCOPY (EGD) WITH PROPOFOL N/A 04/10/2015   Procedure: ESOPHAGOGASTRODUODENOSCOPY (EGD) WITH PROPOFOL;  Surgeon: Daneil Dolin, MD;  [facility];  Service: Endoscopy;  Laterality: N/A;   ESOPHAGOGASTRODUODENOSCOPY (EGD) WITH PROPOFOL N/A  12/06/2016   Procedure: ESOPHAGOGASTRODUODENOSCOPY (EGD) WITH PROPOFOL;  Surgeon: Danie Binder, MD;  [facility];  Service: Endoscopy;  Laterality: N/A;   ESOPHAGOGASTRODUODENOSCOPY (EGD) WITH PROPOFOL N/A 04/27/2019   Procedure: ESOPHAGOGASTRODUODENOSCOPY (EGD) WITH PROPOFOL;  Surgeon: Rogene Houston, MD;  [facility];  Service: Endoscopy;  Laterality: N/A;  730   ESOPHAGOGASTRODUODENOSCOPY (EGD) WITH PROPOFOL N/A 08/31/2019   Procedure: ESOPHAGOGASTRODUODENOSCOPY (EGD) WITH PROPOFOL;  Surgeon: Rogene Houston, MD;  [facility];  Service: Endoscopy;  Laterality: N/A;   ESOPHAGOGASTRODUODENOSCOPY (EGD) WITH PROPOFOL N/A 01/16/2021   Procedure: ESOPHAGOGASTRODUODENOSCOPY (EGD) WITH PROPOFOL;  Surgeon: Harvel Quale, MD;  [facility];  Service: Gastroenterology;  Laterality: N/A;   ESOPHAGOGASTRODUODENOSCOPY (EGD) WITH PROPOFOL N/A 04/15/2021   Procedure: ESOPHAGOGASTRODUODENOSCOPY (EGD) WITH PROPOFOL;  Surgeon: Harvel Quale, MD;  [facility];  Service: Gastroenterology;  Laterality: N/A;  9:00 AM   GAB  2007   in [place]-POUCH 5 CM   GASTRIC BYPASS  06/2006   HYSTEROSCOPY WITH D & C N/A 09/12/2014   Procedure: DILATATION AND CURETTAGE /HYSTEROSCOPY;  Surgeon: Jonnie Kind, MD;  [facility];  Service: Gynecology;  Laterality: N/A;   KYPHOPLASTY N/A 08/02/2018   Procedure: KYPHOPLASTY T12;  Surgeon: Melina Schools, MD;  Location: [facility];  Service: Orthopedics;  Laterality: N/A;  60 mins   LAPAROSCOPIC TUBAL LIGATION Bilateral 03/24/2017   Procedure: LAPAROSCOPIC TUBAL LIGATION ([place] Rings);  Surgeon: Jonnie Kind, MD;  Location: [facility];  Service: Gynecology;  Laterality: Bilateral;   REPAIR VAGINAL CUFF N/A 07/30/2014   Procedure: REPAIR VAGINAL CUFF;  Surgeon: Mora Bellman, MD;  Location: [facility];  Service: Gynecology;  Laterality: N/A;   SAVORY DILATION  06/20/2012   Dr. Barnie Alderman gastritis/Ulcer in  the mid jejunum. Empiric dilation.    SURAL NERVE BX Left 02/25/2016   Procedure: LEFT SURAL NERVE BIOPSY;  Surgeon: Jovita Gamma, MD;  Location: [facility];  Service: Neurosurgery;  Laterality: Left;  Left sural nerve biopsy   TONSILLECTOMY     TONSILLECTOMY AND ADENOIDECTOMY     UPPER GASTROINTESTINAL ENDOSCOPY  JULY 2011 NAUSEA-D125,V6, PH 25   Bx; GASTRITIS, POUCH-5 CM LONG   WISDOM TOOTH EXTRACTION      SOCIAL HISTORY:  Social History   Socioeconomic History   Marital status: Divorced    Spouse name: Not on file   Number of children: 2   Years of education: some college   Highest education level: Some college, no degree  Occupational History   Occupation: Applying for Social Security Disability    Employer: NOT EMPLOYED  Tobacco Use   Smoking status: Former    Packs/day: 0.25    Years: 6.00    Total pack years: 1.50    Types: Cigarettes    Start date: 05/2018   Smokeless tobacco: Never  Vaping Use   Vaping Use: Every day  Substance and Sexual Activity   Alcohol use: Not Currently   Drug use: Yes    Types: Marijuana   Sexual activity: Yes    Partners: Male    Birth control/protection: Surgical    Comment: tubal and ablation  Other Topics Concern   Not on file  Social History Narrative   Patient is right handed.   Patient drinks one cup of coffee daily.   She lives in a one-story home with her two children (52 year old son, 31 month old daughter).   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 [employer]: Medium Risk (08/25/2021)   Overall Financial Resource Strain (CARDIA)    Difficulty of Paying Living Expenses: Somewhat hard  Food Insecurity: Food Insecurity Present (08/25/2021)   Hunger Vital Sign    Worried About [place] in the Last Year: Sometimes true    Ran Out of Food in the Last Year: Sometimes true  Transportation Needs: No Transportation  Needs (08/25/2021)   PRAPARE - Hydrologist (Medical): No    Lack of Transportation (Non-Medical): No  Physical Activity: Inactive (08/25/2021)   Exercise Vital Sign    Days of Exercise per Week: 0 days    Minutes of Exercise per Session: 10 min  Stress: Stress Concern Present (08/25/2021)   [place] -  Occupational Stress Questionnaire    Feeling of Stress : Very much  Social Connections: Moderately Isolated (08/25/2021)   Social Connection and Isolation Panel [NHANES]    Frequency of Communication with Friends and Family: More than three times a week    Frequency of Social Gatherings with Friends and Family: Once a week    Attends Religious Services: 1 to 4 times per year    Active Member of [employer] or Organizations: No    Attends Archivist Meetings: Never    Marital Status: Divorced  Human resources officer Violence: Not At Risk (08/25/2021)   Humiliation, Afraid, Rape, and Kick questionnaire    Fear of Current or Ex-Partner: No    Emotionally Abused: No    Physically Abused: No    Sexually Abused: No    FAMILY HISTORY:  Family History  Problem Relation Age of Onset   Coronary artery disease Paternal Grandfather    Migraines Paternal Grandmother    Breast cancer Paternal Grandmother    Hemochromatosis Maternal Grandmother    Migraines Maternal Grandmother    Cancer Maternal Grandmother    Breast cancer Maternal Grandmother    Hypertension Father    Diabetes Father    Coronary artery disease Father    Cancer Mother        breast   Hemochromatosis Mother    Breast cancer Mother    Depression Mother    Anxiety disorder Mother    Anxiety disorder Brother    Bipolar disorder Brother    Healthy Daughter    Healthy Son     CURRENT MEDICATIONS:  Outpatient Encounter Medications as of 01/10/2023  Medication Sig   ADDERALL XR 30 MG 24 hr capsule TAKE 1 CAPSULE BY MOUTH ONCE EVERY MORNING.   albuterol (VENTOLIN HFA)  108 (90 Base) MCG/ACT inhaler Inhale into the lungs every 6 (six) hours as needed for wheezing or shortness of breath.   amphetamine-dextroamphetamine (ADDERALL XR) 30 MG 24 hr capsule Take 1 capsule (30 mg total) by mouth daily.   Calcium Carb-Cholecalciferol (CALCIUM-VITAMIN D3) 600-500 MG-UNIT CAPS Take 1 tablet by mouth daily.   cetirizine (ZYRTEC) 10 MG tablet TAKE 1 TABLET BY MOUTH ONCE DAILY   clidinium-chlordiazePOXIDE (LIBRAX) 5-2.5 MG capsule TAKE (1) CAPSULE BY MOUTH 3 TIMES DAILY BEFORE MEALS. (Patient taking differently: prn)   clonazePAM (KLONOPIN) 0.5 MG tablet Take 1 tablet (0.5 mg total) by mouth 3 (three) times daily as needed.   cyclobenzaprine (FLEXERIL) 10 MG tablet Take 10 mg by mouth 3 (three) times daily.   DULoxetine (CYMBALTA) 60 MG capsule Take 1 capsule (60 mg total) by mouth 2 (two) times daily.   Erenumab-aooe (AIMOVIG) 140 MG/ML SOAJ INJECT 1 PEN INTO THE SKIN ONCE EVERY 28 DAYS.   fluticasone (FLOVENT HFA) 220 MCG/ACT inhaler Inhale 2 puffs into the lungs 2 (two) times daily. Do not inhale, swallow powder and drink water , do not eat food for 20 minutes after puffs   levalbuterol (XOPENEX HFA) 45 MCG/ACT inhaler INHALE (2) PUFFS INTO THE LUNGS EVERY SIX HOURS AS NEEDED FOR WHEEZING.   linaclotide (LINZESS) 290 MCG CAPS capsule Take 1 capsule (290 mcg total) by mouth daily before breakfast.   ondansetron (ZOFRAN-ODT) 8 MG disintegrating tablet Take 8 mg by mouth every 8 (eight) hours as needed.   oseltamivir (TAMIFLU) 75 MG capsule Take 1 capsule (75 mg total) by mouth daily. prophylactic   pantoprazole (PROTONIX) 40 MG tablet Take 1 tablet (40 mg total) by mouth daily.  propranolol (INDERAL) 10 MG tablet Take 1 tablet (10 mg total) by mouth 3 (three) times daily.   Rimegepant Sulfate (NURTEC) 75 MG TBDP TAKE 1 TABLET BY MOUTH AS NEEDED. MAX TWICE WEEKLY.   tiZANidine (ZANAFLEX) 2 MG tablet Take 2 mg by mouth 3 (three) times daily as needed.   traZODone (DESYREL) 100  MG tablet Take 2 tablets (200 mg total) by mouth at bedtime.   valACYclovir (VALTREX) 1000 MG tablet TAKE 1 TABLET BY MOUTH DAILY.   Vitamin D, Ergocalciferol, (DRISDOL) 1.25 MG (50000 UNIT) CAPS capsule Take 50,000 Units by mouth every Wednesday.   No facility-administered encounter medications on file as of 01/10/2023.    ALLERGIES:  Allergies  Allergen Reactions   Gabapentin Other (See Comments)    Pt states that she was unresponsive after taking this medication, but her vitals remained stable.     Metoclopramide Hcl Anxiety and Other (See Comments)    Pt states that she felt like she was trapped in a box, and could not get out.  Pt also states that she had temporary loss of movement, weakness, and tingling.     Tramadol Other (See Comments)    Seizures   Ativan [Lorazepam] Other (See Comments)    combative   Latex Itching, Rash and Other (See Comments)    Burns   Lyrica [Pregabalin] Other (See Comments)    suicidal   Iron Other (See Comments)    PATIENT IS INTOLERANT TO ORAL IRON , SHE DOES NOT ABSORB IT.     Sulfa Antibiotics Hives   Butrans [Buprenorphine] Rash    Patch caused rash, didn't help with pain   Nucynta [Tapentadol] Nausea And Vomiting   Propofol Nausea And Vomiting   Tape Hives, Itching and Rash    Please use paper tape   Toradol [Ketorolac Tromethamine] Anxiety    LABORATORY DATA:  I have reviewed the labs as listed.  CBC    Component Value Date/Time   WBC 3.4 12/13/2022 1150   WBC 9.1 08/31/2021 1431   RBC 3.80 12/13/2022 1150   RBC 4.41 08/31/2021 1431   HGB 12.2 12/13/2022 1150   HCT 35.7 12/13/2022 1150   PLT 191 12/13/2022 1150   MCV 94 12/13/2022 1150   MCH 32.1 12/13/2022 1150   MCH 32.7 08/31/2021 1431   MCHC 34.2 12/13/2022 1150   MCHC 33.4 08/31/2021 1431   RDW 11.8 12/13/2022 1150   LYMPHSABS 1.6 06/29/2022 1202   MONOABS 0.6 08/31/2021 1431   EOSABS 0.3 06/29/2022 1202   BASOSABS 0.0 06/29/2022 1202      Latest Ref Rng & Units  12/13/2022   11:50 AM 06/29/2022   12:02 PM 08/31/2021    2:31 PM  CMP  Glucose 70 - 99 mg/dL 91  83  65   BUN 6 - 20 mg/dL '18  9  11   '$ Creatinine 0.57 - 1.00 mg/dL 0.49  0.56  0.61   Sodium 134 - 144 mmol/L 137  138  137   Potassium 3.5 - 5.2 mmol/L 3.8  4.8  4.0   Chloride 96 - 106 mmol/L 103  102  101   CO2 20 - 29 mmol/L '21  26  30   '$ Calcium 8.7 - 10.2 mg/dL 8.5  8.9  9.1   Total Protein 6.0 - 8.5 g/dL 6.6  6.2    Total Bilirubin 0.0 - 1.2 mg/dL 0.3  0.3    Alkaline Phos 44 - 121 IU/L 64  103  AST 0 - 40 IU/L 23  16    ALT 0 - 32 IU/L 16  16      DIAGNOSTIC IMAGING:  I have independently reviewed the relevant imaging and discussed with the patient.   WRAP UP:  All questions were answered. The patient knows to call the clinic with any problems, questions or concerns.  Medical decision making: ***  Time spent on visit: I spent *** minutes counseling the patient face to face. The total time spent in the appointment was *** minutes and more than 50% was on counseling.  Harriett Rush, PA-C  ***

## 2023-01-10 ENCOUNTER — Inpatient Hospital Stay: Payer: Medicaid Other | Admitting: Physician Assistant

## 2023-01-10 ENCOUNTER — Ambulatory Visit: Payer: Medicaid Other | Admitting: Physician Assistant

## 2023-01-11 ENCOUNTER — Inpatient Hospital Stay: Admission: RE | Admit: 2023-01-11 | Payer: Medicaid Other | Source: Ambulatory Visit

## 2023-01-16 IMAGING — MR MR HEAD W/O CM
13 of 14 series · 37 of 48 positions shown · non-contrast
Comparison: 09/05/2020 and prior.

CLINICAL DATA: Memory Loss Mental status change, unknown cause

EXAM:
MRI HEAD WITHOUT CONTRAST
TECHNIQUE: Multiplanar, multiecho pulse sequences of the brain and surrounding
structures were obtained without intravenous contrast.

[Series 5: DWI · axial · 3.0mm · 0.77mm/px · z∈[-58,+78]mm · 4 of 47 slices shown (1 of 6)]
[im 1/47]
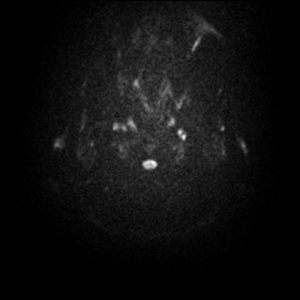
[im 16/47]
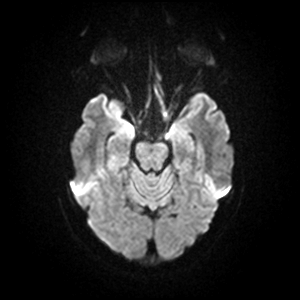
[im 31/47]
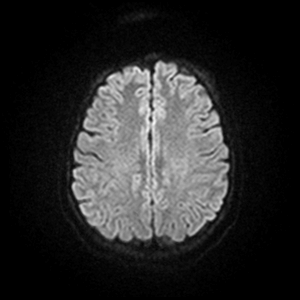
[im 47/47]
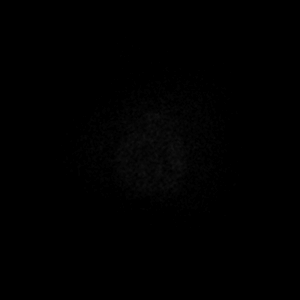

[Series 5: DWI · axial · 3.0mm · 0.77mm/px · z∈[-58,+78]mm · 4 of 48 slices shown (2 of 6)]
[im 1/48]
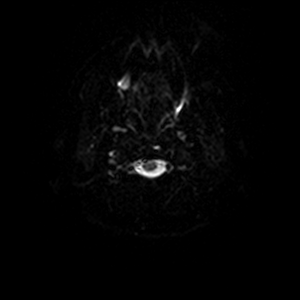
[im 16/48]
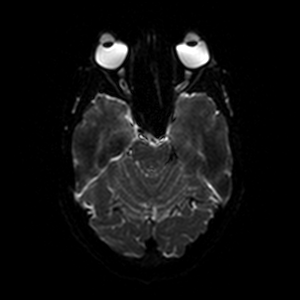
[im 32/48]
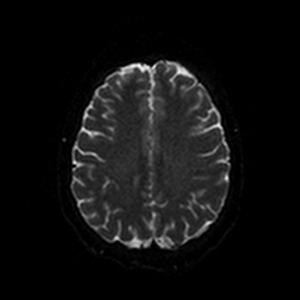
[im 48/48]
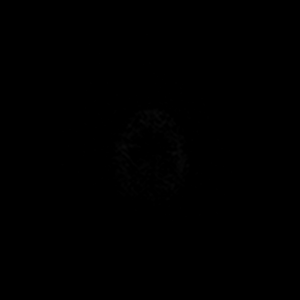

[Series 6: DWI · axial · 3.0mm · 0.77mm/px · z∈[-58,+78]mm · 4 of 48 slices shown (3 of 6)]
[im 1/48]
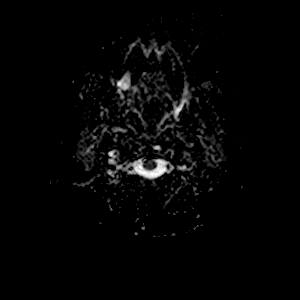
[im 16/48]
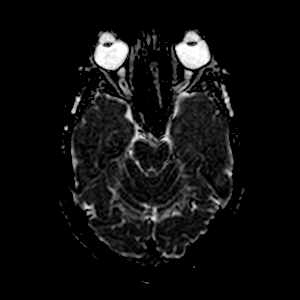
[im 32/48]
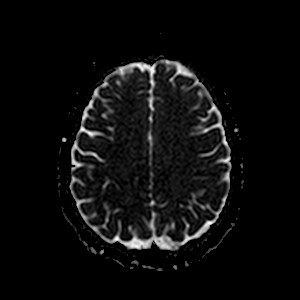
[im 48/48]
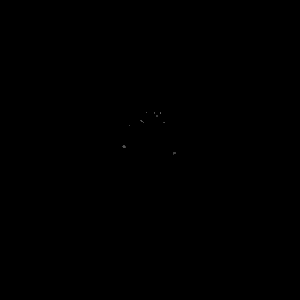

[Series 7: DWI · coronal · 5.0mm · 0.88mm/px · 2 of 28 slices shown (4 of 6)]
[im 1/28]
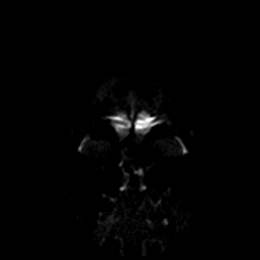
[im 28/28]
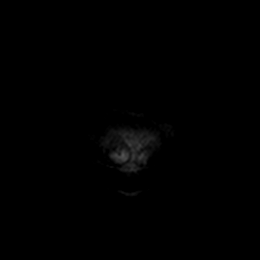

[Series 7: DWI · coronal · 5.0mm · 0.88mm/px · 2 of 28 slices shown (5 of 6)]
[im 1/28]
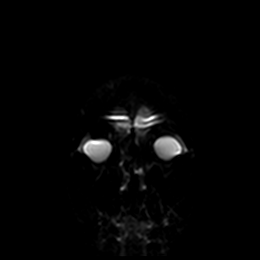
[im 28/28]
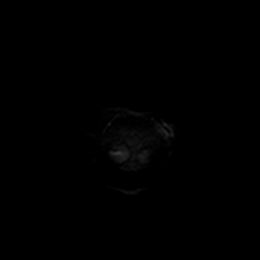

[Series 8: DWI · coronal · 5.0mm · 0.88mm/px · 2 of 28 slices shown (6 of 6)]
[im 1/28]
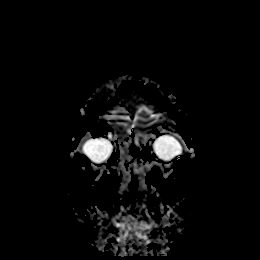
[im 28/28]
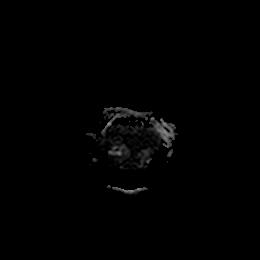

[Series 9: T1 · sagittal · 5.0mm · 0.75mm/px · 1 of 19 slices shown]
[im 1/19]
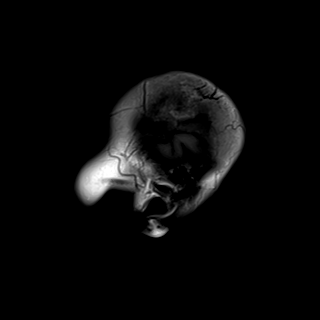

[Series 10: T2 · axial · 5.0mm · 0.72mm/px · 1 of 20 slices shown (1 of 2)]
[im 1/20]
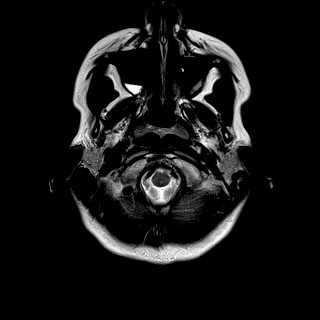

[Series 11: mag_images · axial · 3.0mm · 0.90mm/px · z∈[-75,+97]mm · 4 of 60 slices shown]
[im 1/60]
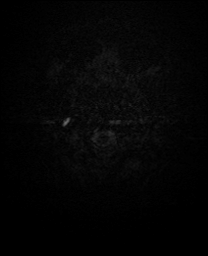
[im 20/60]
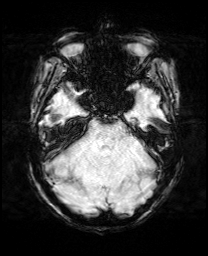
[im 40/60]
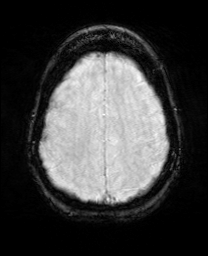
[im 60/60]
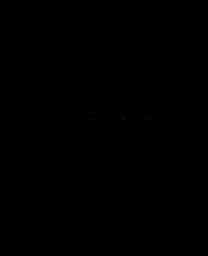

[Series 12: pha_images · axial · 3.0mm · 0.90mm/px · z∈[-75,+80]mm · 4 of 54 slices shown]
[im 1/54]
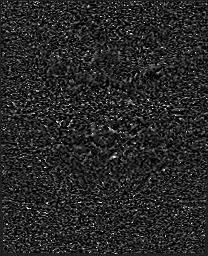
[im 18/54]
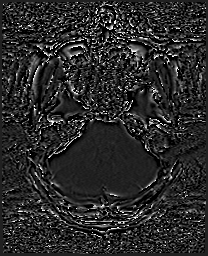
[im 36/54]
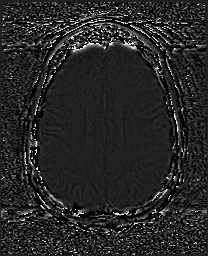
[im 54/54]
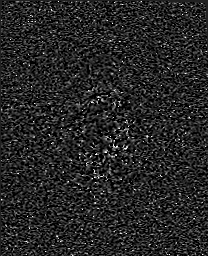

[Series 13: swi_images · axial · 3.0mm · 0.90mm/px · z∈[-75,+94]mm · 4 of 59 slices shown]
[im 1/59]
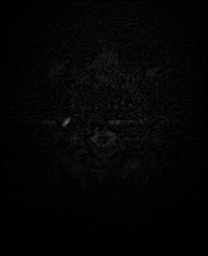
[im 20/59]
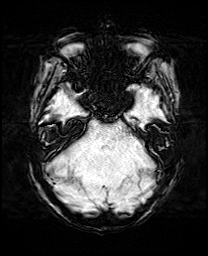
[im 39/59]
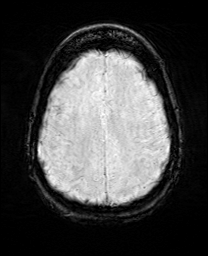
[im 59/59]
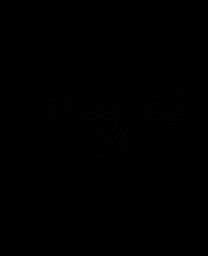

[Series 15: FLAIR · axial · 3.0mm · 0.45mm/px · z∈[-45,+68]mm · 3 of 40 slices shown]
[im 1/40]
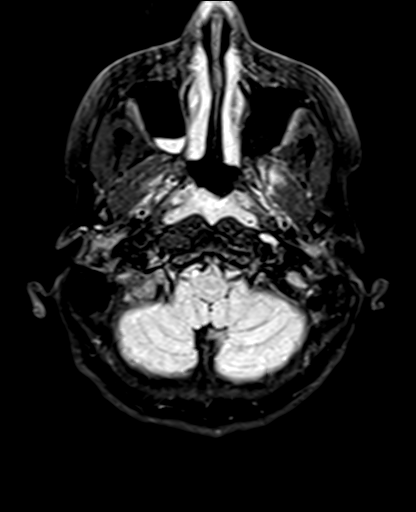
[im 20/40]
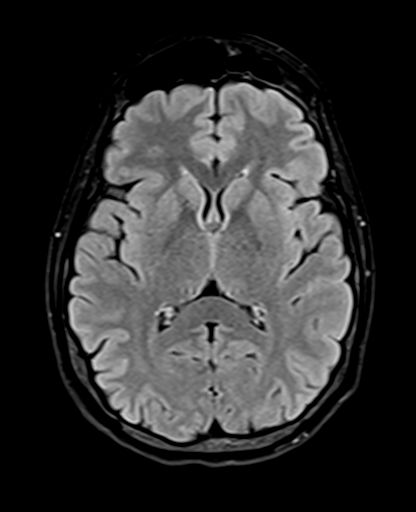
[im 40/40]
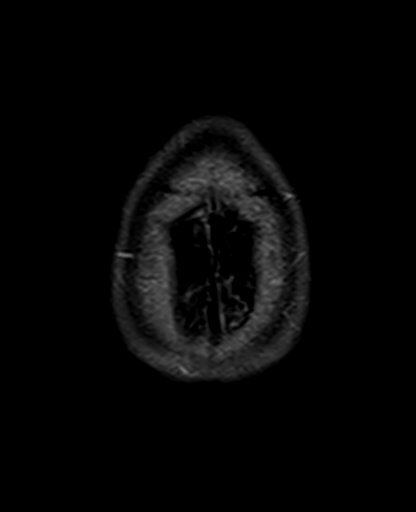

[Series 17: T2 · coronal · 5.0mm · 0.72mm/px · 2 of 28 slices shown (2 of 2)]
[im 1/28]
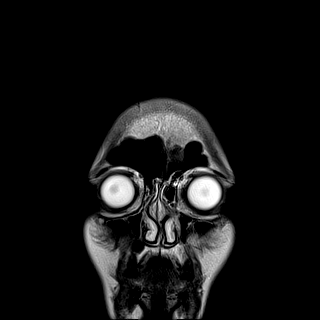
[im 28/28]
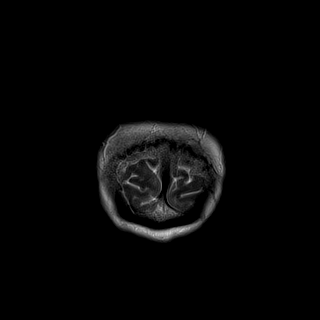

[37 of 48 positions shown; findings below may reference images not displayed]

FINDINGS: Brain: No diffusion-weighted signal abnormality. No intracranial
hemorrhage. No midline shift, ventriculomegaly or extra-axial fluid
collection. No mass lesion. 5 mm pineal cyst is unchanged.

Vascular: Normal flow voids.

Skull and upper cervical spine: Normal marrow signal.

Sinuses/Orbits: Normal orbits. Minimal maxillary sinus mucosal
thickening. No mastoid effusion.

Other: Please note some image sequences are mildly motion degraded.
IMPRESSION: Stable appearance of 5 mm pineal cyst.

Mild maxillary sinus disease

Otherwise unremarkable MRI brain.

## 2023-03-04 IMAGING — DX DG LUMBAR SPINE 2-3V
2 series · 2 of 2 positions shown · non-contrast
Comparison: 06/25/2020.

CLINICAL DATA: 34-year-old who fell onto her back 4 days ago,
persistent pain. Personal history of T12 fracture with augmentation.

EXAM:
LUMBAR SPINE - 2-3 VIEW

[lumbar spine ap]
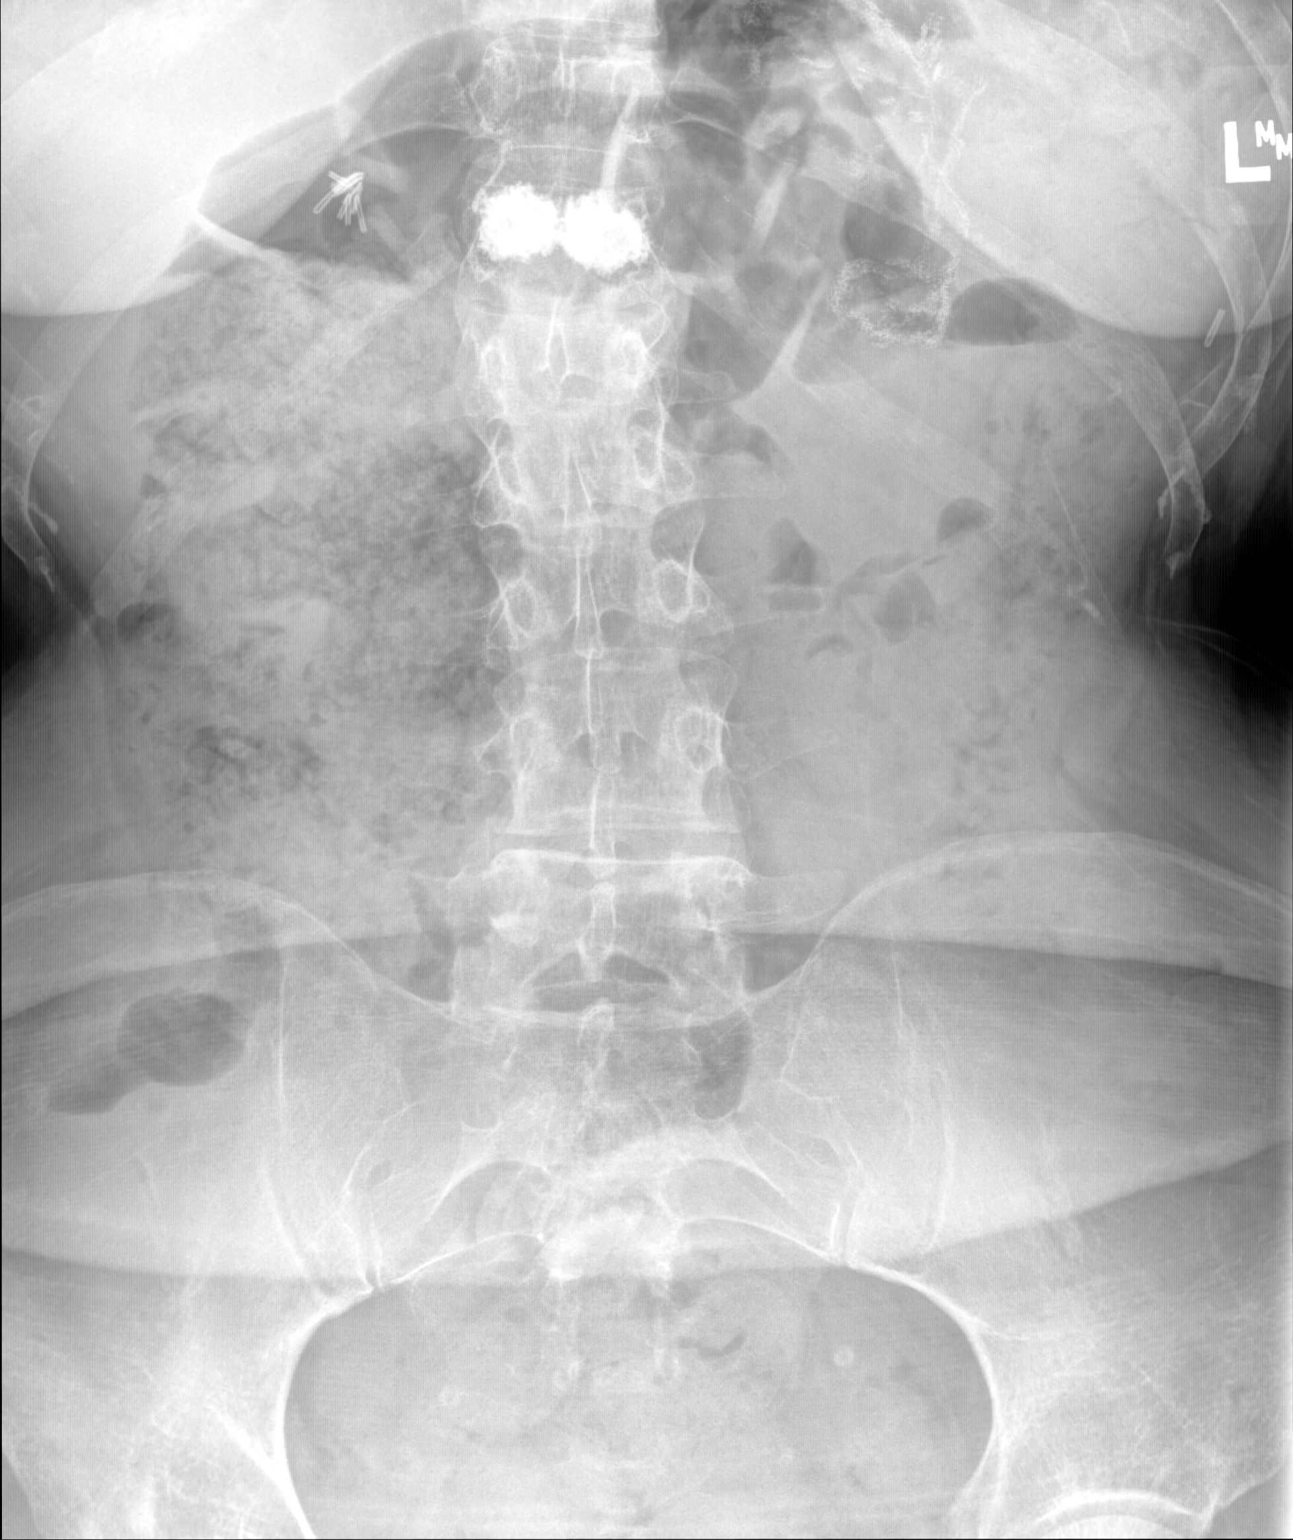

[lumbar spine lat]
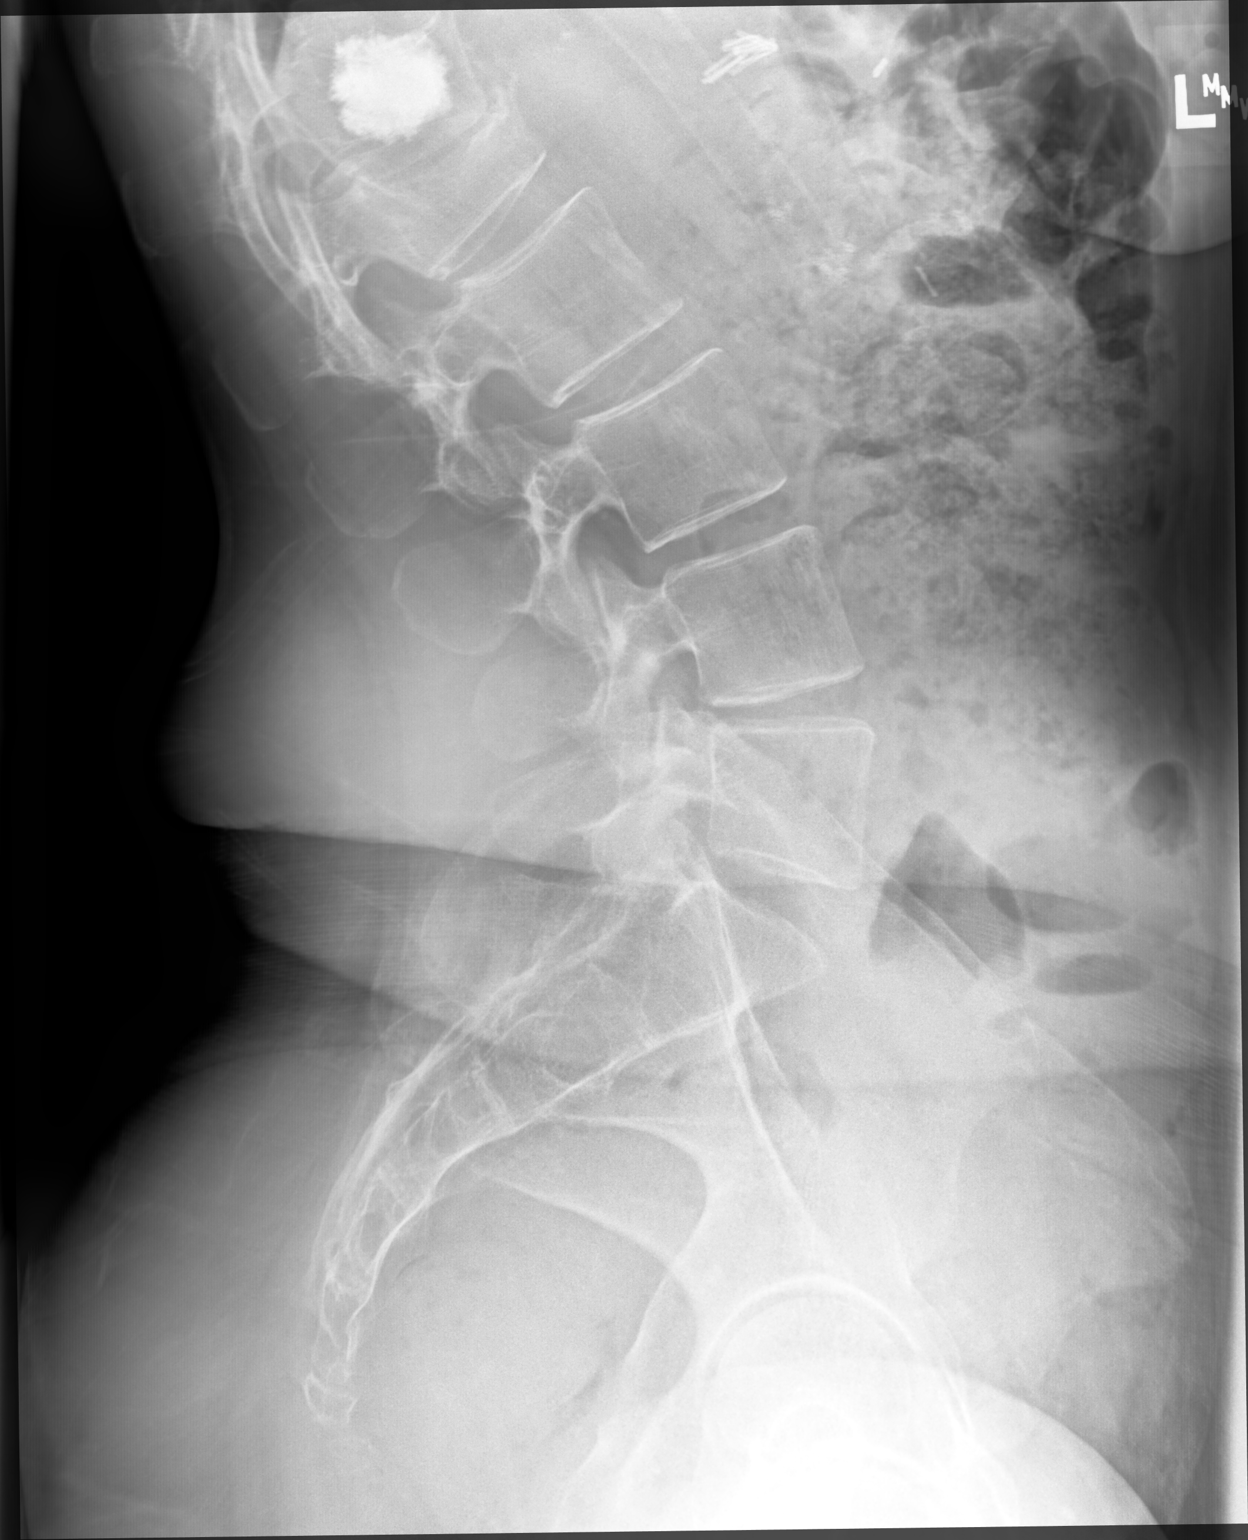

[2 of 2 positions shown; findings below may reference images not displayed]

FINDINGS: Five non rib-bearing lumbar vertebrae with anatomic posterior
alignment. Fracture of the upper endplate of L1 on the order of
40-50%, increased since the 06/25/2020 examination. No other
fractures. Prior T12 compression fracture with augmentation.

Mild disc space narrowing at L4-5. Remaining disc spaces
well-preserved. Sacroiliac joints anatomically aligned without
significant degenerative changes.
IMPRESSION: 1. Fracture of the upper endplate of L1 on the order of 40-50%,
increased since the 06/25/2020 examination. No other acute
fractures.
2. Prior T12 compression fracture with augmentation.

## 2023-03-17 ENCOUNTER — Encounter: Payer: Self-pay | Admitting: Hematology

## 2023-03-17 ENCOUNTER — Inpatient Hospital Stay: Payer: Medicaid Other | Admitting: Hematology

## 2023-03-17 ENCOUNTER — Inpatient Hospital Stay: Payer: Medicaid Other | Attending: Physician Assistant | Admitting: Physician Assistant

## 2023-03-17 VITALS — BP 121/84 | HR 115 | Temp 97.7°F | Resp 18 | Ht 66.0 in | Wt 167.6 lb

## 2023-03-17 DIAGNOSIS — R202 Paresthesia of skin: Secondary | ICD-10-CM | POA: Diagnosis not present

## 2023-03-17 DIAGNOSIS — Z8052 Family history of malignant neoplasm of bladder: Secondary | ICD-10-CM | POA: Diagnosis not present

## 2023-03-17 DIAGNOSIS — Z87891 Personal history of nicotine dependence: Secondary | ICD-10-CM | POA: Insufficient documentation

## 2023-03-17 DIAGNOSIS — Z9884 Bariatric surgery status: Secondary | ICD-10-CM

## 2023-03-17 DIAGNOSIS — Z803 Family history of malignant neoplasm of breast: Secondary | ICD-10-CM | POA: Insufficient documentation

## 2023-03-17 DIAGNOSIS — D508 Other iron deficiency anemias: Secondary | ICD-10-CM | POA: Diagnosis not present

## 2023-03-17 DIAGNOSIS — R519 Headache, unspecified: Secondary | ICD-10-CM | POA: Diagnosis not present

## 2023-03-17 DIAGNOSIS — R200 Anesthesia of skin: Secondary | ICD-10-CM | POA: Insufficient documentation

## 2023-03-17 DIAGNOSIS — K909 Intestinal malabsorption, unspecified: Secondary | ICD-10-CM | POA: Insufficient documentation

## 2023-03-17 DIAGNOSIS — Z808 Family history of malignant neoplasm of other organs or systems: Secondary | ICD-10-CM | POA: Diagnosis not present

## 2023-03-17 DIAGNOSIS — Z832 Family history of diseases of the blood and blood-forming organs and certain disorders involving the immune mechanism: Secondary | ICD-10-CM | POA: Diagnosis not present

## 2023-03-17 LAB — VITAMIN B12: Vitamin B-12: 307 pg/mL (ref 180–914)

## 2023-03-17 NOTE — Progress Notes (Signed)
[facility] 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   HISTORY OF PRESENTING ILLNESS:  Penny [name] 37 y.o. female returns for a follow up for iron deficiency anemia. She was last seen by Dr. Ellin Saba on 07/09/2022. In the interim, she received IV feraheme 510 mg x 1 dose on 07/12/2022.   She reports that she did have some side effects with the last infusion including seeing black spots, headache and nausea. She was given IV fluids and compazine which improved her symptoms.   Today she reports having increased fatigue which impacts her ADLs. She adds having some shortness of breath with exertion and dizziness. She has chronic nausea which improves with zofran as needed. She denies any abdominal pain. Her bowel habits are fairly stable with occasional episodes of diarrhea versus constipation. She denies easy bruising or signs of bleeding. She reports numbness and tingling in her fingertips which interferes with her grip. She denies fevers, chills,sweats, chest pain or cough. She has no other complaints. Rest of the 10 point ROS is below.   MEDICAL HISTORY:  Past Medical History:  Diagnosis Date   ADD (attention deficit disorder)    Anemia 2011   2o to GASTRIC BYPASS   Anginal pain (HCC)    Anxiety    Asthma    Blood transfusion without reported diagnosis    Chronic daily headache    Depression    Depression    Dysmenorrhea 09/18/2013   Dysrhythmia    Elevated liver enzymes JUL 2011ALK PHOS 111-127 AST  143-267 ALT  213-321T BILI 0.6  ALB  3.7-4.06 Jun 2011 ALK PHOS 118 AST 24 ALT 42 T BILI 0.4 ALB 3.9   Encounter for drug rehabilitation    at behavioral health for opioid addiction about 4 years ago   Family history of adverse reaction to anesthesia    'dad had to be kept on pump for breathing for morphine'   Fatty liver    Fibroid 01/18/2017   Fibromyalgia    Gastric bypass status for obesity    Gastritis JULY 2011   Heart murmur    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   History of Holter monitoring    Hx of opioid abuse (HCC)    for about 4 years, about 4 years ago   Interstitial cystitis    Iron deficiency anemia 07/23/2010   Irritable bowel syndrome 2012 DIARRHEA   JUN 2012 TTG IgA 14.9   IUD FEB 2010   Lupus (HCC)    Menorrhagia 07/18/2013   Migraines    Obesity (BMI 30-39.9) 2011 228 LBS BMI 36.8   Osteoporosis    Ovarian cyst    Patient desires pregnancy 09/18/2013   Polyneuropathy    PONV (postoperative nausea and vomiting) seizure post-operatively   seizure following ankle surgery   Potassium (K) deficiency    Pregnant 12/25/2013   Psychiatric pseudoseizure    RLQ abdominal pain 07/18/2013   Sciatica of left side 11/14/2014   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   Stress 09/18/2013    SURGICAL HISTORY: Past Surgical History:  Procedure Laterality Date  BIOPSY  04/10/2015   Procedure: BIOPSY;  Surgeon: Corbin Ade, MD;  Location: [facility];  Service: Endoscopy;;   BIOPSY  04/15/2021   Procedure: BIOPSY;  Surgeon: Marguerita Merles, Reuel Boom, MD;  Location: [facility];  Service: Gastroenterology;;   cath self every nite     for sodium bicarb injection (discontinued 2013)   CHOLECYSTECTOMY  2005   biliary dyskinesia   COLONOSCOPY  JUN 2012 ABD PN/DIARRHEA WITH PROPOFOL   NL COLON   COLONOSCOPY WITH PROPOFOL N/A 01/16/2021   Procedure: COLONOSCOPY WITH PROPOFOL;  Surgeon: Dolores Frame, MD;  Location: [facility];  Service: Gastroenterology;  Laterality: N/A;  1:45    DILATION AND CURETTAGE OF UTERUS     DILITATION & CURRETTAGE/HYSTROSCOPY WITH NOVASURE ABLATION N/A 03/24/2017   Procedure: DILATATION & CURETTAGE/HYSTEROSCOPY WITH NOVASURE ENDOMETRIAL ABLATION;  Surgeon: Tilda Burrow, MD;  Location: [facility];  Service: Gynecology;  Laterality: N/A;   ESOPHAGEAL DILATION N/A 04/10/2015   Procedure: ESOPHAGEAL DILATION WITH 54FR MALONEY DILATOR;  Surgeon: Corbin Ade, MD;  Location: [facility];  Service: Endoscopy;  Laterality: N/A;   ESOPHAGOGASTRODUODENOSCOPY     ESOPHAGOGASTRODUODENOSCOPY (EGD) WITH PROPOFOL N/A 04/10/2015   Procedure: ESOPHAGOGASTRODUODENOSCOPY (EGD) WITH PROPOFOL;  Surgeon: Corbin Ade, MD;  Location: [facility];  Service: Endoscopy;  Laterality: N/A;   ESOPHAGOGASTRODUODENOSCOPY (EGD) WITH PROPOFOL N/A 12/06/2016   Procedure: ESOPHAGOGASTRODUODENOSCOPY (EGD) WITH PROPOFOL;  Surgeon: West Bali, MD;  [facility]: [facility];  Service: Endoscopy;  Laterality: N/A;   ESOPHAGOGASTRODUODENOSCOPY (EGD) WITH PROPOFOL N/A 04/27/2019   Procedure: ESOPHAGOGASTRODUODENOSCOPY (EGD) WITH PROPOFOL;  Surgeon: Malissa Hippo, MD;  [facility];  Service: Endoscopy;  Laterality: N/A;  730   ESOPHAGOGASTRODUODENOSCOPY (EGD) WITH PROPOFOL N/A 08/31/2019   Procedure: ESOPHAGOGASTRODUODENOSCOPY (EGD) WITH PROPOFOL;  Surgeon: Malissa Hippo, MD;  [facility]: [facility];  Service: Endoscopy;  Laterality: N/A;   ESOPHAGOGASTRODUODENOSCOPY (EGD) WITH PROPOFOL N/A 01/16/2021   Procedure: ESOPHAGOGASTRODUODENOSCOPY (EGD) WITH PROPOFOL;  Surgeon: Dolores Frame, MD;  [facility];  Service: Gastroenterology;  Laterality: N/A;   ESOPHAGOGASTRODUODENOSCOPY (EGD) WITH PROPOFOL N/A 04/15/2021   Procedure: ESOPHAGOGASTRODUODENOSCOPY (EGD) WITH PROPOFOL;  Surgeon: Dolores Frame, MD;  [facility]: [facility];  Service: Gastroenterology;  Laterality: N/A;  9:00 AM   GAB  2007   in [place]-POUCH 5 CM   GASTRIC BYPASS  06/2006    HYSTEROSCOPY WITH D & C N/A 09/12/2014   Procedure: DILATATION AND CURETTAGE /HYSTEROSCOPY;  Surgeon: Tilda Burrow, MD;  Location: [facility];  Service: Gynecology;  Laterality: N/A;   KYPHOPLASTY N/A 08/02/2018   Procedure: KYPHOPLASTY T12;  Surgeon: Venita Lick, MD;  Location: [facility];  Service: Orthopedics;  Laterality: N/A;  60 mins   LAPAROSCOPIC TUBAL LIGATION Bilateral 03/24/2017   Procedure: LAPAROSCOPIC TUBAL LIGATION ([place] Rings);  Surgeon: Tilda Burrow, MD;  Location: [facility];  Service: Gynecology;  Laterality: Bilateral;   REPAIR VAGINAL CUFF N/A 07/30/2014   Procedure: REPAIR VAGINAL CUFF;  Surgeon: Catalina Antigua, MD;  Location: [facility];  Service: Gynecology;  Laterality: N/A;   SAVORY DILATION  06/20/2012   Dr. Cyndi Bender gastritis/Ulcer in the mid jejunum. Empiric dilation.    SURAL NERVE BX Left 02/25/2016   Procedure: LEFT SURAL NERVE BIOPSY;  Surgeon: Shirlean Kelly, MD;  Location: [facility];  Service: Neurosurgery;  Laterality: Left;  Left sural nerve biopsy   TONSILLECTOMY     TONSILLECTOMY AND ADENOIDECTOMY     UPPER GASTROINTESTINAL ENDOSCOPY  JULY 2011 NAUSEA-D125,V6, PH 25   Bx; GASTRITIS, POUCH-5 CM LONG   WISDOM TOOTH EXTRACTION      SOCIAL HISTORY: Social History   Socioeconomic History   Marital status: Divorced    Spouse name: Not on file   Number of children: 2   Years of education: some college   Highest education level: Some college, no degree  Occupational History   Occupation: Applying for Social Security Disability    Employer: NOT EMPLOYED  Tobacco Use   Smoking status: Former    Packs/day: 0.25    Years: 6.00    Additional pack years: 0.00    Total pack years: 1.50    Types: Cigarettes    Start date: 05/2018   Smokeless tobacco: Never  Vaping Use   Vaping Use: Every day  Substance and Sexual Activity   Alcohol use: Not Currently   Drug use: Yes    Types: Marijuana   Sexual activity: Yes    Partners: Male    Birth  control/protection: Surgical    Comment: tubal and ablation  Other Topics Concern   Not on file  Social History Narrative   Patient is right handed.   Patient drinks one cup of coffee daily.   She lives in a one-story home with her two children (60 year old son, 19 month old daughter).   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 [employer]: Medium Risk (08/25/2021)   Overall Financial Resource Strain (CARDIA)    Difficulty of Paying Living Expenses: Somewhat hard  Food Insecurity: Food Insecurity Present (08/25/2021)   Hunger Vital Sign    Worried About Running Out of Food in the Last Year: Sometimes true    Ran Out of Food in the Last Year: Sometimes true  Transportation Needs: No Transportation Needs (08/25/2021)   PRAPARE - Administrator, Civil Service (Medical): No    Lack of Transportation (Non-Medical): No  Physical Activity: Inactive (08/25/2021)   Exercise Vital Sign    Days of Exercise per Week: 0 days    Minutes of Exercise per Session: 10 min  Stress: Stress Concern Present (08/25/2021)   [employer] of Occupational Health - Occupational Stress Questionnaire    Feeling of Stress : Very much  Social Connections: Moderately Isolated (08/25/2021)   Social Connection and Isolation Panel [NHANES]    Frequency of Communication with Friends and Family: More than three times a week    Frequency of Social Gatherings with Friends and Family: Once a week    Attends Religious Services: 1 to 4 times per year    Active Member of [employer] or Organizations: No    Attends Banker Meetings: Never    Marital Status: Divorced  Catering manager Violence: Not At Risk (08/25/2021)   Humiliation, Afraid, Rape, and Kick questionnaire    Fear of Current or Ex-Partner: No    Emotionally Abused: No    Physically Abused: No    Sexually Abused: No    FAMILY HISTORY: Family  History  Problem Relation Age of Onset   Coronary artery disease Paternal Grandfather    Migraines Paternal Grandmother    Breast cancer Paternal Grandmother    Hemochromatosis Maternal Grandmother    Migraines Maternal Grandmother    Cancer Maternal Grandmother    Breast cancer Maternal Grandmother    Hypertension Father    Diabetes Father  Coronary artery disease Father    Cancer Mother        breast   Hemochromatosis Mother    Breast cancer Mother    Depression Mother    Anxiety disorder Mother    Anxiety disorder Brother    Bipolar disorder Brother    Healthy Daughter    Healthy Son     ALLERGIES:  is allergic to gabapentin, metoclopramide hcl, tramadol, ativan [lorazepam], latex, lyrica [pregabalin], iron, sulfa antibiotics, butrans [buprenorphine], nucynta [tapentadol], propofol, tape, and toradol [ketorolac tromethamine].  MEDICATIONS:  Current Outpatient Medications  Medication Sig Dispense Refill   ADDERALL XR 30 MG 24 hr capsule TAKE 1 CAPSULE BY MOUTH ONCE EVERY MORNING. 30 capsule 0   albuterol (VENTOLIN HFA) 108 (90 Base) MCG/ACT inhaler Inhale into the lungs every 6 (six) hours as needed for wheezing or shortness of breath.     azelastine (ASTELIN) 0.1 % nasal spray spray 2 spray by intranasal route 2 times every day in each nostril     budesonide-formoterol (SYMBICORT) 160-4.5 MCG/ACT inhaler Inhale into the lungs.     Calcium Carb-Cholecalciferol (CALCIUM-VITAMIN D3) 600-500 MG-UNIT CAPS Take 1 tablet by mouth daily.     cetirizine (ZYRTEC) 10 MG tablet TAKE 1 TABLET BY MOUTH ONCE DAILY 30 tablet 0   clidinium-chlordiazePOXIDE (LIBRAX) 5-2.5 MG capsule TAKE (1) CAPSULE BY MOUTH 3 TIMES DAILY BEFORE MEALS. (Patient taking differently: prn) 90 capsule 0   clonazePAM (KLONOPIN) 0.5 MG tablet Take 1 tablet (0.5 mg total) by mouth 3 (three) times daily as needed. 90 tablet 2   cyclobenzaprine (FLEXERIL) 10 MG tablet Take 10 mg by mouth 3 (three) times daily.      DULoxetine (CYMBALTA) 60 MG capsule Take 1 capsule (60 mg total) by mouth 2 (two) times daily. 180 capsule 2   Erenumab-aooe (AIMOVIG) 140 MG/ML SOAJ INJECT 1 PEN INTO THE SKIN ONCE EVERY 28 DAYS. 1 mL 11   fluticasone (FLOVENT HFA) 220 MCG/ACT inhaler Inhale 2 puffs into the lungs 2 (two) times daily. Do not inhale, swallow powder and drink water , do not eat food for 20 minutes after puffs 4 each 2   levalbuterol (XOPENEX HFA) 45 MCG/ACT inhaler INHALE (2) PUFFS INTO THE LUNGS EVERY SIX HOURS AS NEEDED FOR WHEEZING. 15 g 3   linaclotide (LINZESS) 290 MCG CAPS capsule Take 1 capsule (290 mcg total) by mouth daily before breakfast. 90 capsule 3   Olopatadine HCl 0.2 % SOLN Apply to eye.     ondansetron (ZOFRAN-ODT) 8 MG disintegrating tablet Take 8 mg by mouth every 8 (eight) hours as needed.     oseltamivir (TAMIFLU) 75 MG capsule Take 1 capsule (75 mg total) by mouth daily. prophylactic 10 capsule 0   pantoprazole (PROTONIX) 40 MG tablet Take 1 tablet (40 mg total) by mouth daily. 30 tablet 5   propranolol (INDERAL) 10 MG tablet Take 1 tablet (10 mg total) by mouth 3 (three) times daily. 90 tablet 2   REXULTI 2 MG TABS tablet Take 2 mg by mouth daily.     Rimegepant Sulfate (NURTEC) 75 MG TBDP TAKE 1 TABLET BY MOUTH AS NEEDED. MAX TWICE WEEKLY. 8 tablet 11   tiZANidine (ZANAFLEX) 2 MG tablet Take 2 mg by mouth 3 (three) times daily as needed.     traZODone (DESYREL) 100 MG tablet Take 2 tablets (200 mg total) by mouth at bedtime. 60 tablet 2   valACYclovir (VALTREX) 1000 MG tablet TAKE 1 TABLET BY MOUTH DAILY. 30 tablet PRN  Vitamin D, Ergocalciferol, (DRISDOL) 1.25 MG (50000 UNIT) CAPS capsule Take 50,000 Units by mouth every Wednesday.     No current facility-administered medications for this visit.    REVIEW OF SYSTEMS:   Constitutional: Negative for appetite change, chills, fever and unexpected weight change. +fatigue HENT: Negative for mouth sores, nosebleeds, sore throat and trouble  swallowing.   Eyes: Negative for eye problems and icterus.  Respiratory: Negative for cough, hemoptysis,and wheezing.  +Shortness of breath Cardiovascular: Negative for chest pain. +Lower extremity edema Gastrointestinal: Negative for abdominal pain +nausea, constipation, diarrhea Genitourinary: Negative for bladder incontinence, difficulty urinating, dysuria, frequency and hematuria.   Musculoskeletal: Negative for gait problem, neck pain and neck stiffness. +chronic back pain Skin:Negative for rash and ulcers Neurological: Negative for extremity weakness, gait problem, headaches, light-headedness and seizures. +dizziness Hematological: Negative for adenopathy. Does not bruise/bleed easily.  Psychiatric/Behavioral: Negative for confusion, depression and sleep disturbance. The patient is not nervous/anxious.     PHYSICAL EXAMINATION: ECOG PERFORMANCE STATUS: 1 - Symptomatic but completely ambulatory  Vitals:   03/17/23 0920  BP: 121/84  Pulse: (!) 115  Resp: 18  Temp: 97.7 F (36.5 C)  SpO2: 100%   Filed Weights   03/17/23 0920  Weight: 167 lb 9.6 oz (76 kg)   Constitutional: Oriented to person, place, and time and well-developed, well-nourished, and in no distress.  HENT:  Head: Normocephalic and atraumatic.  Eyes: Conjunctivae are normal. Right eye exhibits no discharge. Left eye exhibits no discharge. No scleral icterus.  Neck: Normal range of motion. Neck supple.   Cardiovascular: Normal rate, regular rhythm, normal heart sounds Pulmonary/Chest: Effort normal and breath sounds normal. No respiratory distress. No wheezes. No rales.  Musculoskeletal: Normal range of motion. Exhibits no edema.  Lymphadenopathy: No cervical adenopathy.  Neurological: Alert and oriented to person, place, and time. Exhibits normal muscle tone. Gait normal. Coordination normal.  Skin: Skin is warm and dry. No rash noted. Not diaphoretic. No erythema. No pallor.  Psychiatric: Mood, memory and  judgment normal.    LABORATORY DATA:  I have reviewed the data as listed No results found for this or any previous visit (from the past 2160 hour(s)).   RADIOGRAPHIC STUDIES: I have personally reviewed the radiological images as listed and agreed with the findings in the report. No results found.  ASSESSMENT:  Iron deficiency anemia: - History of gastric bypass surgery in 2007 - Requiring intermittent IV iron since 2009 - EGD (04/15/2021): Pepper mesh appearance of mucosa in the esophagus.  Gastric bypass with about 5 cm in length and intact staple line.  GJ anastomosis with healthy mucosa.  Normal jejunum. - Colonoscopy (01/16/2021): Preparation of the colon inadequate.  Hemorrhoids, no large masses/polyps/old blood. - She is on oral B12 and vitamin D2 pills since gastric bypass surgery.  Social/family history: - She is a single mother.  She used to work for EMS/fire department.  In 2019, she stopped working due to work-related injury.  She is non-smoker. - Maternal grandmother had hemochromatosis and breast cancer.  Mother had hemochromatosis and breast cancer.  Maternal grandfather had kidney cancer.  Maternal uncle had liver cancer.   PLAN:  Iron deficiency anemia: - Most likely from malabsorption of iron from gastric bypass surgery. -- Labs from 03/03/2023 show no anemia with Hgb 11.4 (normal range is 11.1-15.9). Iron panel shows deficiency with iron saturation of    11%, ferritin 8.  --Recommend IV feraheme 510 mg once a week x 2 doses.  - We will give  Compazine and 1/2 L of IV fluids as premedication to minimize any side effects.   Tingling/Numbness in Fingertips: --Patient reports taking B12 oral supplementation but recommend checking vitamin B12 and MMA levels today due to ongoing symptoms.  --Plan to arrange for B12 injections if there is B12 deficiency.    FOLLOW UP: --IV feraheme 510 mg once a week x 2 doses --3 months: labs only visit --6 months: labs and follow  up  All questions were answered. The patient knows to call the clinic with any problems, questions or concerns.  I have spent a total of 30 minutes minutes of face-to-face and non-face-to-face time, preparing to see the patient, performing a medically appropriate examination, counseling and educating the patient, ordering medications/tests/procedures,documenting clinical information in the electronic health record, and care coordination.   Georga KaufmannIrene Angello Chien PA-C Dept of Hematology and Oncology [facility]

## 2023-03-19 ENCOUNTER — Encounter: Payer: Self-pay | Admitting: Physician Assistant

## 2023-03-21 ENCOUNTER — Telehealth: Payer: Self-pay | Admitting: *Deleted

## 2023-03-21 ENCOUNTER — Inpatient Hospital Stay: Payer: Medicaid Other

## 2023-03-21 VITALS — BP 103/67 | HR 88 | Temp 98.0°F | Resp 18

## 2023-03-21 DIAGNOSIS — D508 Other iron deficiency anemias: Secondary | ICD-10-CM | POA: Diagnosis not present

## 2023-03-21 DIAGNOSIS — D500 Iron deficiency anemia secondary to blood loss (chronic): Secondary | ICD-10-CM

## 2023-03-21 MED ORDER — SODIUM CHLORIDE 0.9 % IV SOLN: INTRAVENOUS | Status: DC

## 2023-03-21 MED ORDER — SODIUM CHLORIDE 0.9 % IV SOLN: Freq: Once | INTRAVENOUS | Status: DC

## 2023-03-21 MED ORDER — ACETAMINOPHEN 325 MG PO TABS
650.0000 mg | ORAL_TABLET | Freq: Once | ORAL | Status: DC
  Filled 2023-03-21: qty 2

## 2023-03-21 MED ORDER — PROCHLORPERAZINE EDISYLATE 10 MG/2ML IJ SOLN
10.0000 mg | Freq: Once | INTRAMUSCULAR | Status: AC
  Administered 2023-03-21: 10 mg via INTRAVENOUS
  Filled 2023-03-21: qty 2

## 2023-03-21 MED ORDER — CETIRIZINE HCL 10 MG PO TABS
10.0000 mg | ORAL_TABLET | Freq: Once | ORAL | Status: AC
  Administered 2023-03-21: 10 mg via ORAL
  Filled 2023-03-21: qty 1

## 2023-03-21 MED ORDER — SODIUM CHLORIDE 0.9 % IV SOLN
510.0000 mg | Freq: Once | INTRAVENOUS | Status: AC
  Administered 2023-03-21: 510 mg via INTRAVENOUS
  Filled 2023-03-21: qty 510

## 2023-03-21 MED ORDER — METHYLPREDNISOLONE SODIUM SUCC 125 MG IJ SOLR
125.0000 mg | Freq: Once | INTRAMUSCULAR | Status: AC
  Administered 2023-03-21: 125 mg via INTRAVENOUS
  Filled 2023-03-21: qty 2

## 2023-03-21 MED ORDER — LORATADINE 10 MG PO TABS: 10.0000 mg | ORAL_TABLET | Freq: Once | ORAL | Status: DC

## 2023-03-21 MED ORDER — SODIUM CHLORIDE 0.9% IV SOLUTION
Freq: Once | INTRAVENOUS | Status: AC
  Administered 2023-03-21: 500 mL via INTRAVENOUS

## 2023-03-21 NOTE — Progress Notes (Signed)
Pharmacy has substituted cetirizine 10 mg orally x 1 as premedication for   Loratidine discontinued.  V.O. Dr Katragadda/Temica Righetti, PharmD  

## 2023-03-21 NOTE — Patient Instructions (Signed)
[facility]  Discharge Instructions: Thank you for choosing [facility] to provide your oncology and hematology care.  If you have a lab appointment with the Cancer Center - please note that after April 8th, 2024, all labs will be drawn in the cancer center.  You do not have to check in or register with the main entrance as you have in the past but will complete your check-in in the cancer center.  Wear comfortable clothing and clothing appropriate for easy access to any Portacath or PICC line.   We strive to give you quality time with your provider. You may need to reschedule your appointment if you arrive late (15 or more minutes).  Arriving late affects you and other patients whose appointments are after yours.  Also, if you miss three or more appointments without notifying the office, you may be dismissed from the clinic at the provider's discretion.      For prescription refill requests, have your pharmacy contact our office and allow 72 hours for refills to be completed.    Today you received Feraheme IV and of normal saline.  BELOW ARE SYMPTOMS THAT SHOULD BE REPORTED IMMEDIATELY: *FEVER GREATER THAN 100.4 F (38 C) OR HIGHER *CHILLS OR SWEATING *NAUSEA AND VOMITING THAT IS NOT CONTROLLED WITH YOUR NAUSEA MEDICATION *UNUSUAL SHORTNESS OF BREATH *UNUSUAL BRUISING OR BLEEDING *URINARY PROBLEMS (pain or burning when urinating, or frequent urination) *BOWEL PROBLEMS (unusual diarrhea, constipation, pain near the anus) TENDERNESS IN MOUTH AND THROAT WITH OR WITHOUT PRESENCE OF ULCERS (sore throat, sores in mouth, or a toothache) UNUSUAL RASH, SWELLING OR PAIN  UNUSUAL VAGINAL DISCHARGE OR ITCHING   Items with * indicate a potential emergency and should be followed up as soon as possible or go to the Emergency Department if any problems should occur.  Please show the CHEMOTHERAPY ALERT CARD or IMMUNOTHERAPY ALERT CARD at check-in to the Emergency  Department and triage nurse.  Should you have questions after your visit or need to cancel or reschedule your appointment, please contact [facility] [phone]  and follow the prompts.  Office hours are 8:00 a.m. to 4:30 p.m. Monday - Friday. Please note that voicemails left after 4:00 p.m. may not be returned until the following business day.  We are closed weekends and major holidays. You have access to a nurse at all times for urgent questions. Please call the main number to the clinic [phone] and follow the prompts.  For any non-urgent questions, you may also contact your provider using MyChart. We now offer e-Visits for anyone 62 and older to request care online for non-urgent symptoms. For details visit [email or web address].   Also download the MyChart app! Go to the app store, search "MyChart", open the app, select [facility], and log in with your MyChart username and password.

## 2023-03-21 NOTE — Telephone Encounter (Signed)
Received [facility] from patient stating that she had experienced a "blackout" spell, associated with chest pain and tachycardia on Saturday.  States feeling normal today.  Consulted with Dr. Ellin Saba as to if it was ok to receive iron infusion today.  Per Dr. Ellin Saba, ok to proceed.  Patient notified to report to the ER immediately if the above symptoms should occur again.  Verbalized understanding.

## 2023-03-21 NOTE — Progress Notes (Signed)
Patient presents Feraheme IV iron for iron infusion.  Patient is in satisfactory condition with no new complaints voiced.  Vital signs are stable. of normal saline to be given over 1 hour as a pre-med  per I.Thayil-PA-C's note on 03/17/23.  We will proceed with infusion per provider orders.    Peripheral IV started with good blood return pre and post infusion.  Feraheme and 500 mL of normal saline  over 1 hour given today per MD orders. Tolerated infusion without adverse affects. Vital signs stable. No complaints at this time. Discharged from clinic ambulatory in stable condition. Alert and oriented x 3. F/U with [facility] as scheduled.

## 2023-03-23 LAB — METHYLMALONIC ACID, SERUM: Methylmalonic Acid, Quantitative: 249 nmol/L (ref 0–378)

## 2023-03-24 NOTE — Progress Notes (Signed)
Patient called.  Patient aware.  

## 2023-03-25 ENCOUNTER — Encounter: Payer: Self-pay | Admitting: Physician Assistant

## 2023-04-18 ENCOUNTER — Other Ambulatory Visit: Payer: Self-pay | Admitting: Family Medicine

## 2023-04-18 ENCOUNTER — Other Ambulatory Visit (HOSPITAL_COMMUNITY): Payer: Self-pay | Admitting: Psychiatry

## 2023-04-18 ENCOUNTER — Encounter: Payer: Self-pay | Admitting: Neurology

## 2023-04-18 ENCOUNTER — Ambulatory Visit: Payer: Medicaid Other | Admitting: Neurology

## 2023-04-18 NOTE — Telephone Encounter (Signed)
Call for appt

## 2023-04-26 IMAGING — DX DG CHEST 2V
2 series · 2 of 2 positions shown · non-contrast
Comparison: 08/20/2020

CLINICAL DATA: Intermittent fever for 1 week, diaphoresis,
vomiting, dizziness

EXAM:
CHEST - 2 VIEW

[chest pa]
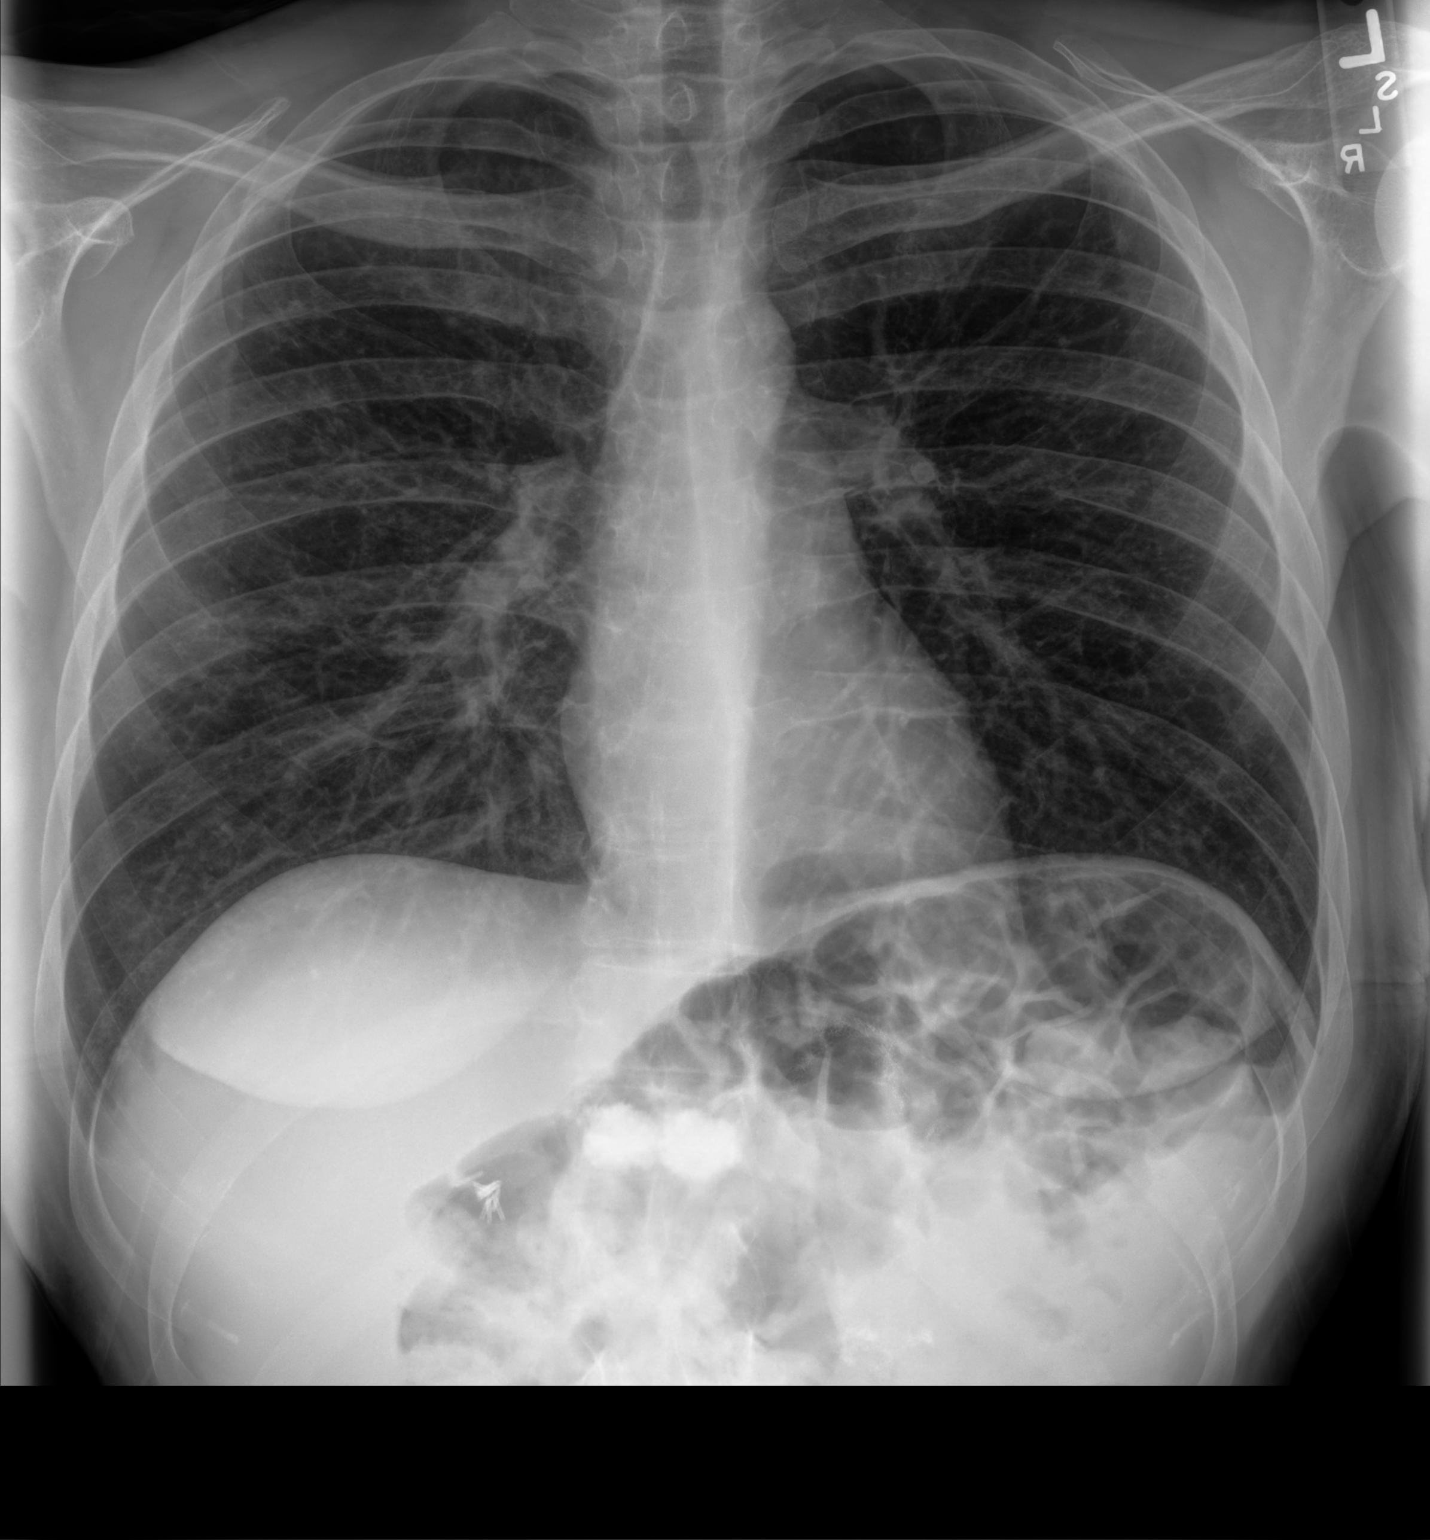

[chest lat]
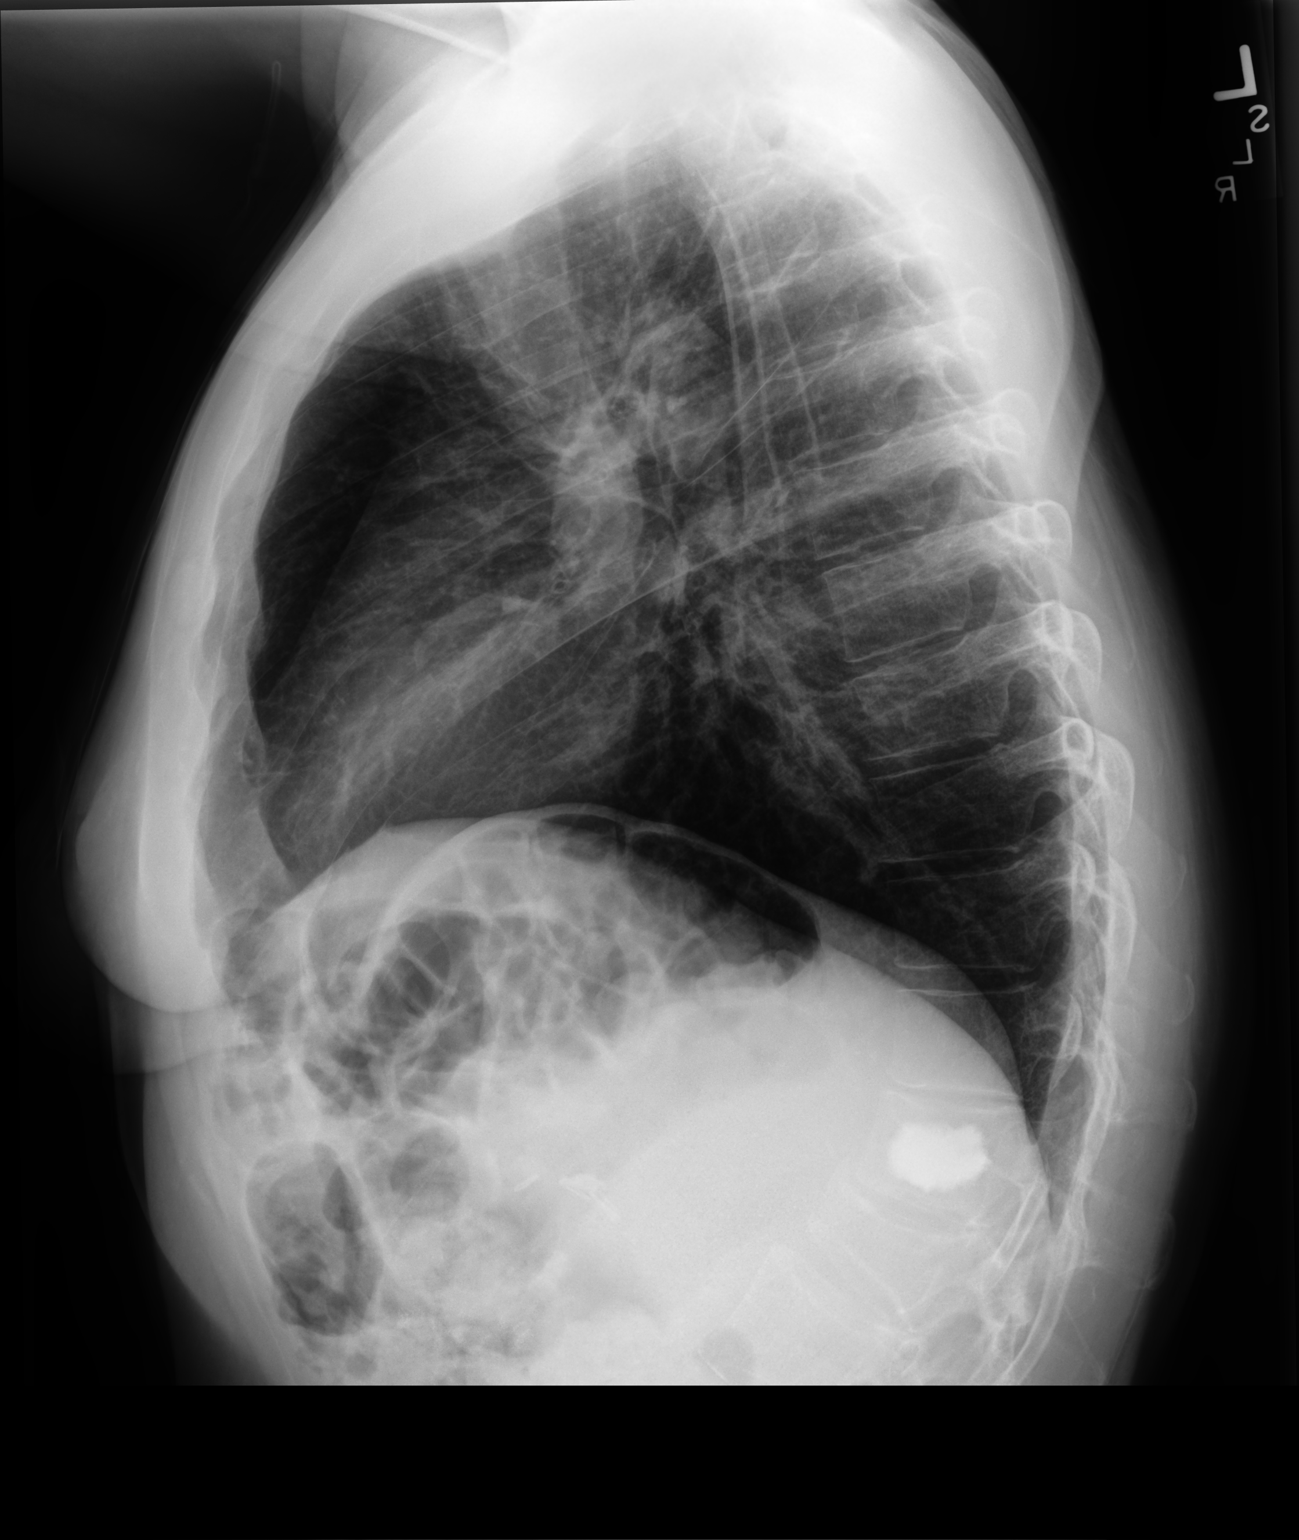

[2 of 2 positions shown; findings below may reference images not displayed]

FINDINGS: Frontal and lateral views of the chest demonstrate an unremarkable
cardiac silhouette. No acute airspace disease, effusion, or
pneumothorax. Small residual nodular scarring within the right upper
lobe overlying the second rib consistent with prior healed
infection. There are no acute bony abnormalities. Chronic L1
compression deformity. Previous vertebral augmentation at T12.
IMPRESSION: 1. No acute intrathoracic process.

## 2023-05-19 ENCOUNTER — Inpatient Hospital Stay: Payer: Medicaid Other | Attending: Physician Assistant

## 2023-05-19 VITALS — BP 106/69 | HR 75 | Temp 98.0°F | Resp 18

## 2023-05-19 DIAGNOSIS — K909 Intestinal malabsorption, unspecified: Secondary | ICD-10-CM | POA: Insufficient documentation

## 2023-05-19 DIAGNOSIS — D500 Iron deficiency anemia secondary to blood loss (chronic): Secondary | ICD-10-CM

## 2023-05-19 DIAGNOSIS — Z9884 Bariatric surgery status: Secondary | ICD-10-CM | POA: Insufficient documentation

## 2023-05-19 DIAGNOSIS — D508 Other iron deficiency anemias: Secondary | ICD-10-CM | POA: Diagnosis present

## 2023-05-19 MED ORDER — SODIUM CHLORIDE 0.9 % IV SOLN: Freq: Once | INTRAVENOUS | Status: AC

## 2023-05-19 MED ORDER — CETIRIZINE HCL 10 MG PO TABS
10.0000 mg | ORAL_TABLET | Freq: Once | ORAL | Status: AC
  Administered 2023-05-19: 10 mg via ORAL
  Filled 2023-05-19: qty 1

## 2023-05-19 MED ORDER — SODIUM CHLORIDE 0.9 % IV SOLN
510.0000 mg | Freq: Once | INTRAVENOUS | Status: AC
  Administered 2023-05-19: 510 mg via INTRAVENOUS
  Filled 2023-05-19: qty 510

## 2023-05-19 MED ORDER — METHYLPREDNISOLONE SODIUM SUCC 125 MG IJ SOLR
125.0000 mg | Freq: Once | INTRAMUSCULAR | Status: AC
  Administered 2023-05-19: 125 mg via INTRAVENOUS
  Filled 2023-05-19: qty 2

## 2023-05-19 MED ORDER — PROCHLORPERAZINE EDISYLATE 10 MG/2ML IJ SOLN
10.0000 mg | Freq: Once | INTRAMUSCULAR | Status: AC
  Administered 2023-05-19: 10 mg via INTRAVENOUS
  Filled 2023-05-19: qty 2

## 2023-05-19 NOTE — Progress Notes (Signed)
Patient tolerated iron infusion with no complaints voiced.  Peripheral IV site clean and dry with good blood return noted before and after infusion.  Band aid applied.  VSS with discharge and left in satisfactory condition with no s/s of distress noted.   

## 2023-05-19 NOTE — Patient Instructions (Signed)
[facility]  Discharge Instructions: Thank you for choosing [facility] to provide your oncology and hematology care.  If you have a lab appointment with the Cancer Center - please note that after April 8th, 2024, all labs will be drawn in the cancer center.  You do not have to check in or register with the main entrance as you have in the past but will complete your check-in in the cancer center.  Wear comfortable clothing and clothing appropriate for easy access to any Portacath or PICC line.   We strive to give you quality time with your provider. You may need to reschedule your appointment if you arrive late (15 or more minutes).  Arriving late affects you and other patients whose appointments are after yours.  Also, if you miss three or more appointments without notifying the office, you may be dismissed from the clinic at the provider's discretion.      For prescription refill requests, have your pharmacy contact our office and allow 72 hours for refills to be completed.  To help prevent nausea and vomiting after your treatment, we encourage you to take your nausea medication as directed.  BELOW ARE SYMPTOMS THAT SHOULD BE REPORTED IMMEDIATELY: *FEVER GREATER THAN 100.4 F (38 C) OR HIGHER *CHILLS OR SWEATING *NAUSEA AND VOMITING THAT IS NOT CONTROLLED WITH YOUR NAUSEA MEDICATION *UNUSUAL SHORTNESS OF BREATH *UNUSUAL BRUISING OR BLEEDING *URINARY PROBLEMS (pain or burning when urinating, or frequent urination) *BOWEL PROBLEMS (unusual diarrhea, constipation, pain near the anus) TENDERNESS IN MOUTH AND THROAT WITH OR WITHOUT PRESENCE OF ULCERS (sore throat, sores in mouth, or a toothache) UNUSUAL RASH, SWELLING OR PAIN  UNUSUAL VAGINAL DISCHARGE OR ITCHING   Items with * indicate a potential emergency and should be followed up as soon as possible or go to the Emergency Department if any problems should occur.  Please show the CHEMOTHERAPY ALERT CARD or  IMMUNOTHERAPY ALERT CARD at check-in to the Emergency Department and triage nurse.  Should you have questions after your visit or need to cancel or reschedule your appointment, please contact [facility] [phone]  and follow the prompts.  Office hours are 8:00 a.m. to 4:30 p.m. Monday - Friday. Please note that voicemails left after 4:00 p.m. may not be returned until the following business day.  We are closed weekends and major holidays. You have access to a nurse at all times for urgent questions. Please call the main number to the clinic [phone] and follow the prompts.  For any non-urgent questions, you may also contact your provider using MyChart. We now offer e-Visits for anyone 18 and older to request care online for non-urgent symptoms. For details visit [email or web address].   Also download the MyChart app! Go to the app store, search "MyChart", open the app, select [facility], and log in with your MyChart username and password.   

## 2023-05-30 ENCOUNTER — Other Ambulatory Visit: Payer: Self-pay | Admitting: Family Medicine

## 2023-06-03 ENCOUNTER — Telehealth: Payer: Medicaid Other | Admitting: Family Medicine

## 2023-06-03 DIAGNOSIS — G8929 Other chronic pain: Secondary | ICD-10-CM

## 2023-06-03 DIAGNOSIS — M5441 Lumbago with sciatica, right side: Secondary | ICD-10-CM

## 2023-06-03 MED ORDER — CYCLOBENZAPRINE HCL 10 MG PO TABS
10.0000 mg | ORAL_TABLET | Freq: Three times a day (TID) | ORAL | 0 refills | Status: AC | PRN
Start: 2023-06-03 — End: ?

## 2023-06-03 NOTE — Patient Instructions (Signed)
Penny [name], thank you for joining Freddy Finner, NP for today's virtual visit.  While this provider is not your primary care provider (PCP), if your PCP is located in our provider database this encounter information will be shared with them immediately following your visit.   A [facility] MyChart account gives you access to today's visit and all your visits, tests, and labs performed at [facility] " click here if you don't have a [facility] MyChart account or go to [email or web address]  Consent: (Patient) Penny [name] provided verbal consent for this virtual visit at the beginning of the encounter.  Current Medications:  Current Outpatient Medications:    cyclobenzaprine (FLEXERIL) 10 MG tablet, Take 1 tablet (10 mg total) by mouth 3 (three) times daily as needed for muscle spasms., Disp: 30 tablet, Rfl: 0   ADDERALL XR 30 MG 24 hr capsule, TAKE 1 CAPSULE BY MOUTH ONCE EVERY MORNING., Disp: 30 capsule, Rfl: 0   azelastine (ASTELIN) 0.1 % nasal spray, spray 2 spray by intranasal route 2 times every day in each nostril, Disp: , Rfl:    budesonide-formoterol (SYMBICORT) 160-4.5 MCG/ACT inhaler, Inhale into the lungs., Disp: , Rfl:    Calcium Carb-Cholecalciferol (CALCIUM-VITAMIN D3) 600-500 MG-UNIT CAPS, Take 1 tablet by mouth daily., Disp: , Rfl:    cetirizine (ZYRTEC) 10 MG tablet, TAKE 1 TABLET BY MOUTH ONCE DAILY, Disp: 30 tablet, Rfl: 0   clonazePAM (KLONOPIN) 0.5 MG tablet, TAKE 1 TABLET BY MOUTH THREE TIMES DAILY AS NEEDED., Disp: 90 tablet, Rfl: 0   DULoxetine (CYMBALTA) 60 MG capsule, Take 1 capsule (60 mg total) by mouth 2 (two) times daily., Disp: 180 capsule, Rfl: 2   Erenumab-aooe (AIMOVIG) 140 MG/ML SOAJ, INJECT 1 PEN INTO THE SKIN ONCE EVERY 28 DAYS., Disp: 1 mL, Rfl: 11   levalbuterol (XOPENEX HFA) 45 MCG/ACT inhaler, INHALE (2) PUFFS INTO THE LUNGS EVERY SIX HOURS AS NEEDED FOR WHEEZING., Disp: 15 g, Rfl: 3   ondansetron (ZOFRAN-ODT) 8 MG disintegrating  tablet, Take 8 mg by mouth every 8 (eight) hours as needed., Disp: , Rfl:    pantoprazole (PROTONIX) 40 MG tablet, TAKE ONE TABLET BY MOUTH ONCE DAILY., Disp: 30 tablet, Rfl: 0   REXULTI 2 MG TABS tablet, Take 2 mg by mouth daily., Disp: , Rfl:    Rimegepant Sulfate (NURTEC) 75 MG TBDP, TAKE 1 TABLET BY MOUTH AS NEEDED. MAX TWICE WEEKLY., Disp: 8 tablet, Rfl: 11   valACYclovir (VALTREX) 1000 MG tablet, TAKE 1 TABLET BY MOUTH DAILY., Disp: 30 tablet, Rfl: PRN   Vitamin D, Ergocalciferol, (DRISDOL) 1.25 MG (50000 UNIT) CAPS capsule, Take 50,000 Units by mouth every Wednesday., Disp: , Rfl:    Medications ordered in this encounter:  Meds ordered this encounter  Medications   cyclobenzaprine (FLEXERIL) 10 MG tablet    Sig: Take 1 tablet (10 mg total) by mouth 3 (three) times daily as needed for muscle spasms.    Dispense:  30 tablet    Refill:  0    Order Specific Question:   Supervising Provider    Answer:   Merrilee Jansky [id number]     *If you need refills on other medications prior to your next appointment, please contact your pharmacy*  Follow-Up: Call back or seek an in-person evaluation if the symptoms worsen or if the condition fails to improve as anticipated.  [facility] [phone]  Other Instructions  Chronic Back Pain Chronic back pain is back pain that lasts longer than  3 months. The pain may get worse at certain times (flare-ups). There are things you can do at home to manage your pain. Follow these instructions at home: Watch for any changes in your symptoms. Take these actions to help with your pain: Managing pain and stiffness     If told, put ice on the painful area. You may be told to use ice for 24-48 hours after a flare-up starts. Put ice in a plastic bag. Place a towel between your skin and the bag. Leave the ice on for 20 minutes, 2-3 times a day. If told, put heat on the painful area. Do this as often as told by your doctor. Use the heat  source that your doctor recommends, such as a moist heat pack or a heating pad. Place a towel between your skin and the heat source. Leave the heat on for 20-30 minutes. If your skin turns bright red, take off the ice or heat right away to prevent skin damage. The risk of damage is higher if you cannot feel pain, heat, or cold. Soak in a warm bath. This can help with pain. Activity        Avoid bending and other activities that make the pain worse. When you stand: Keep your upper back and neck straight. Keep your shoulders pulled back. Avoid slouching. When you sit: Keep your back straight. Relax your shoulders. Do not round your shoulders or pull them backward. Do not sit or stand in one place for too long. Take short rest breaks during the day. Lying down or standing is often better than sitting. Resting can help relieve pain. When sitting or lying down for a long time, do some mild activity or stretching. This will help to prevent stiffness and pain. Get regular exercise. Ask your doctor what activities are safe for you. You may have to avoid lifting. Ask your provider how much you can safely lift. If you lift things: Bend your knees. Keep the weight close to your body. Avoid twisting. Medicines Take over-the-counter and prescription medicines only as told by your doctor. You may need to take medicines for pain and swelling. These may be taken by mouth or put on the skin. You may also be given muscle relaxants. Ask your doctor if the medicine prescribed to you: Requires you to avoid driving or using machinery. Can cause trouble pooping (constipation). You may need to take these actions to prevent or treat trouble pooping: Drink enough fluid to keep your pee (urine) pale yellow. Take over-the-counter or prescription medicines. Eat foods that are high in fiber. These include beans, whole grains, and fresh fruits and vegetables. Limit foods that are high in fat and sugars. These  include fried or sweet foods. General instructions  Sleep on a firm mattress. Try lying on your side with your knees slightly bent. If you lie on your back, put a pillow under your knees. Do not smoke or use any products that contain nicotine or tobacco. If you need help quitting, ask your doctor. Contact a doctor if: Your pain does not get better with rest or medicine. You have new pain. You have a fever. You lose weight quickly. You have trouble doing your normal activities. One or both of your legs or feet feel weak. One or both of your legs or feet lose feeling (have numbness). Get help right away if: You are not able to control when you pee or poop. You have bad back pain and: You feel like you  may vomit (nauseous). You vomit. You have pain in your chest or your belly (abdomen). You have shortness of breath. You faint. These symptoms may be an emergency. Get help right away. Call 911. Do not wait to see if the symptoms will go away. Do not drive yourself to the hospital. This information is not intended to replace advice given to you by your health care provider. Make sure you discuss any questions you have with your health care provider. Document Revised: 07/12/2022 Document Reviewed: 07/12/2022 Elsevier Patient Education  2024 Elsevier Inc.   If you have been instructed to have an in-person evaluation today at a local Urgent Care facility, please use the link below. It will take you to a list of all of our available [facility], including address, phone number and hours of operation. Please do not delay care.  [facility]  If you or a family member do not have a primary care provider, use the link below to schedule a visit and establish care. When you choose a [facility] primary care physician or advanced practice provider, you gain a long-term partner in health. Find a Primary Care Provider  Learn more about [facility] in-office and virtual  care options: [facility] - Get Care Now

## 2023-06-03 NOTE — Progress Notes (Signed)
Virtual Visit Consent   Penny [name], you are scheduled for a virtual visit with a [facility] provider today. Just as with appointments in the office, your consent must be obtained to participate. Your consent will be active for this visit and any virtual visit you may have with one of our providers in the next 365 days. If you have a MyChart account, a copy of this consent can be sent to you electronically.  As this is a virtual visit, video technology does not allow for your provider to perform a traditional examination. This may limit your provider's ability to fully assess your condition. If your provider identifies any concerns that need to be evaluated in person or the need to arrange testing (such as labs, EKG, etc.), we will make arrangements to do so. Although advances in technology are sophisticated, we cannot ensure that it will always work on either your end or our end. If the connection with a video visit is poor, the visit may have to be switched to a telephone visit. With either a video or telephone visit, we are not always able to ensure that we have a secure connection.  By engaging in this virtual visit, you consent to the provision of healthcare and authorize for your insurance to be billed (if applicable) for the services provided during this visit. Depending on your insurance coverage, you may receive a charge related to this service.  I need to obtain your verbal consent now. Are you willing to proceed with your visit today? Penny [name] has provided verbal consent on 06/03/2023 for a virtual visit (video or telephone). Freddy Finner, NP  Date: 06/03/2023 2:45 PM  Virtual Visit via Video Note   I, Freddy Finner, connected with  Penny [name]  ([id number], 20-Nov-1986) on 06/03/23 at  2:45 PM EDT by a video-enabled telemedicine application and verified that I am speaking with the correct person using two identifiers.  Location: Patient: Virtual Visit Location Patient:  Home Provider: Virtual Visit Location Provider: Home Office   I discussed the limitations of evaluation and management by telemedicine and the availability of in person appointments. The patient expressed understanding and agreed to proceed.    History of Present Illness: Penny [name] is a 37 y.o. who identifies as a female who was assigned female at birth, and is being seen today for back pain.  Onset was lower back (history of break at T12 and L1- 2019) was being seen by pain management but was weaned off pain meds last year. Still intermittently using flexeril- but no longer has refills. Is looking for a new provider and or PCP to management. Associated symptoms are pain into right leg  Modifying factors are flexeril, naproxen some times. 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   Absence of bladder continence 06/14/2022   Joint pain 04/01/2022   Chronic prescription opiate use 12/25/2020   Major depressive disorder, recurrent episode (HCC) 06/22/2020   Chronic abdominal pain 09/19/2019   Gastroesophageal reflux disease without esophagitis    Severe anxiety 08/21/2019   Urge incontinence 10/26/2018   S/P kyphoplasty 08/02/2018   Herpes genitalis in women 12/19/2017   Urinary tract infection with hematuria  03/30/2017   Family history of breast cancer in mother,uncertain BR CA status 02/28/2017   Chronic pain syndrome 12/05/2016   Folate deficiency 12/04/2016   Anastomotic ulcer 04/22/2016   Pain medication agreement signed 04/22/2016   Neuropathy 01/23/2016   Major depressive disorder, recurrent episode, 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HCC) 05/10/2015   Fibromyalgia  02/18/2015   Vulvar fissure 02/18/2015   Current smoker 01/29/2015   Current tobacco use 01/29/2015   Sciatica of left side 11/14/2014   Psychogenic nonepileptic seizure 09/12/2014   Hypertension in pregnancy, transient 07/08/2014   Previous gastric bypass affecting pregnancy, antepartum 05/22/2014   History of bariatric surgery 05/22/2014   Patent foramen ovale with right to left shunt 05/17/2014   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   Rapid palpitations 02/18/2014   Supervision of pregnancy with other poor reproductive or obstetric history, unspecified trimester 02/18/2014   Restless legs 01/16/2014   Restless leg 01/16/2014   Chronic interstitial cystitis 10/23/2013   Depression with anxiety 03/10/2012    Class: Chronic   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 03/10/2012    Class: Chronic   Attention deficit hyperactivity disorder, 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    Class: Chronic   Panic disorder without agoraphobia 03/10/2012   H/O disease 03/10/2012   Dysthymia 03/10/2012   Conversion disorder with seizures or convulsions 03/10/2012   Other specified behavioral and emotional disorders with onset usually occurring in childhood and adolescence 03/10/2012   Pelvic congestion syndrome 10/13/2011   Chronic migraine without aura 10/13/2011   Other specified conditions associated with female genital organs and menstrual cycle 10/13/2011   IBS (irritable bowel syndrome) 08/25/2011   Iron deficiency anemia 07/23/2010    Allergies:  Allergies  Allergen Reactions   Gabapentin Other (See Comments)    Pt states that she was unresponsive after taking this medication, but her vitals remained stable.     Metoclopramide Hcl Anxiety and Other (See Comments)    Pt states that she felt like she was trapped in a box, and could not get out.  Pt also states that she had temporary loss of movement, weakness, and tingling.     Tramadol Other (See Comments)     Seizures   Ativan [Lorazepam] Other (See Comments)    combative   Latex Itching, Rash and Other (See Comments)    Burns   Lyrica [Pregabalin] Other (See Comments)    suicidal   Iron Other (See Comments)    PATIENT IS INTOLERANT TO ORAL IRON , SHE DOES NOT ABSORB IT.     Sulfa Antibiotics Hives   Butrans [Buprenorphine] Rash    Patch caused rash, didn't help with pain   Nucynta [Tapentadol] Nausea And Vomiting   Propofol Nausea And Vomiting   Tape Hives, Itching and Rash    Please use paper tape   Toradol [Ketorolac Tromethamine] Anxiety   Medications:  Current Outpatient Medications:    ADDERALL XR 30 MG 24 hr capsule, TAKE 1 CAPSULE BY MOUTH ONCE EVERY MORNING., Disp: 30 capsule, Rfl: 0   albuterol (VENTOLIN HFA) 108 (90 Base) MCG/ACT inhaler, Inhale into the lungs every 6 (six) hours as needed for wheezing or shortness of breath., Disp: , Rfl:    azelastine (ASTELIN) 0.1 % nasal spray, spray 2 spray by intranasal route 2 times every day in each nostril, Disp: , Rfl:    budesonide-formoterol (SYMBICORT) 160-4.5 MCG/ACT inhaler, Inhale into the lungs., Disp: , Rfl:    Calcium Carb-Cholecalciferol (CALCIUM-VITAMIN D3) 600-500 MG-UNIT CAPS, Take  1 tablet by mouth daily., Disp: , Rfl:    cetirizine (ZYRTEC) 10 MG tablet, TAKE 1 TABLET BY MOUTH ONCE DAILY, Disp: 30 tablet, Rfl: 0   clidinium-chlordiazePOXIDE (LIBRAX) 5-2.5 MG capsule, TAKE (1) CAPSULE BY MOUTH 3 TIMES DAILY BEFORE MEALS. (Patient taking differently: prn), Disp: 90 capsule, Rfl: 0   clonazePAM (KLONOPIN) 0.5 MG tablet, TAKE 1 TABLET BY MOUTH THREE TIMES DAILY AS NEEDED., Disp: 90 tablet, Rfl: 0   cyclobenzaprine (FLEXERIL) 10 MG tablet, Take 10 mg by mouth 3 (three) times daily., Disp: , Rfl:    DULoxetine (CYMBALTA) 60 MG capsule, Take 1 capsule (60 mg total) by mouth 2 (two) times daily., Disp: 180 capsule, Rfl: 2   Erenumab-aooe (AIMOVIG) 140 MG/ML SOAJ, INJECT 1 PEN INTO THE SKIN ONCE EVERY 28 DAYS., Disp: 1 mL, Rfl:  11   fluticasone (FLOVENT HFA) 220 MCG/ACT inhaler, Inhale 2 puffs into the lungs 2 (two) times daily. Do not inhale, swallow powder and drink water , do not eat food for 20 minutes after puffs, Disp: 4 each, Rfl: 2   levalbuterol (XOPENEX HFA) 45 MCG/ACT inhaler, INHALE (2) PUFFS INTO THE LUNGS EVERY SIX HOURS AS NEEDED FOR WHEEZING., Disp: 15 g, Rfl: 3   linaclotide (LINZESS) 290 MCG CAPS capsule, Take 1 capsule (290 mcg total) by mouth daily before breakfast., Disp: 90 capsule, Rfl: 3   Olopatadine HCl 0.2 % SOLN, Apply to eye., Disp: , Rfl:    ondansetron (ZOFRAN-ODT) 8 MG disintegrating tablet, Take 8 mg by mouth every 8 (eight) hours as needed., Disp: , Rfl:    oseltamivir (TAMIFLU) 75 MG capsule, Take 1 capsule (75 mg total) by mouth daily. prophylactic, Disp: 10 capsule, Rfl: 0   pantoprazole (PROTONIX) 40 MG tablet, TAKE ONE TABLET BY MOUTH ONCE DAILY., Disp: 30 tablet, Rfl: 0   propranolol (INDERAL) 10 MG tablet, Take 1 tablet (10 mg total) by mouth 3 (three) times daily., Disp: 90 tablet, Rfl: 2   REXULTI 2 MG TABS tablet, Take 2 mg by mouth daily., Disp: , Rfl:    Rimegepant Sulfate (NURTEC) 75 MG TBDP, TAKE 1 TABLET BY MOUTH AS NEEDED. MAX TWICE WEEKLY., Disp: 8 tablet, Rfl: 11   tiZANidine (ZANAFLEX) 2 MG tablet, Take 2 mg by mouth 3 (three) times daily as needed., Disp: , Rfl:    traZODone (DESYREL) 100 MG tablet, Take 2 tablets (200 mg total) by mouth at bedtime., Disp: 60 tablet, Rfl: 2   valACYclovir (VALTREX) 1000 MG tablet, TAKE 1 TABLET BY MOUTH DAILY., Disp: 30 tablet, Rfl: PRN   Vitamin D, Ergocalciferol, (DRISDOL) 1.25 MG (50000 UNIT) CAPS capsule, Take 50,000 Units by mouth every Wednesday., Disp: , Rfl:   Observations/Objective: Patient is well-developed, well-nourished in no acute distress.  Resting comfortably  at home.  Head is normocephalic, atraumatic.  No labored breathing.  Speech is clear and coherent with logical content.  Patient is alert and oriented at  baseline.    Assessment and Plan:  1. Chronic right-sided low back pain with right-sided sciatica  - cyclobenzaprine (FLEXERIL) 10 MG tablet; Take 1 tablet (10 mg total) by mouth 3 (three) times daily as needed for muscle spasms.  Dispense: 30 tablet; Refill: 0   -extensive history of back trouble -does not want pain management again, overall is doing well and only uses flexeril as needed -will follow up with PCP for on going management -no red flags today during visit  Reviewed side effects, risks and benefits of medication.  Patient acknowledged agreement and understanding of the plan.   Past Medical, Surgical, Social History, Allergies, and Medications have been Reviewed.    Follow Up Instructions: I discussed the assessment and treatment plan with the patient. The patient was provided an opportunity to ask questions and all were answered. The patient agreed with the plan and demonstrated an understanding of the instructions.  A copy of instructions were sent to the patient via MyChart unless otherwise noted below.     The patient was advised to call back or seek an in-person evaluation if the symptoms worsen or if the condition fails to improve as anticipated.  Time:  I spent 12 minutes with the patient via telehealth technology discussing the above problems/concerns.    Freddy Finner, NP

## 2023-06-07 ENCOUNTER — Encounter: Payer: Self-pay | Admitting: Physician Assistant

## 2023-06-16 ENCOUNTER — Encounter: Payer: Self-pay | Admitting: Family Medicine

## 2023-06-16 ENCOUNTER — Ambulatory Visit (INDEPENDENT_AMBULATORY_CARE_PROVIDER_SITE_OTHER): Payer: MEDICAID | Admitting: Family Medicine

## 2023-06-16 VITALS — BP 122/81 | HR 96 | Temp 97.9°F | Ht 66.0 in | Wt 175.6 lb

## 2023-06-16 DIAGNOSIS — M5432 Sciatica, left side: Secondary | ICD-10-CM

## 2023-06-16 DIAGNOSIS — R5383 Other fatigue: Secondary | ICD-10-CM

## 2023-06-16 DIAGNOSIS — E538 Deficiency of other specified B group vitamins: Secondary | ICD-10-CM

## 2023-06-16 DIAGNOSIS — M5441 Lumbago with sciatica, right side: Secondary | ICD-10-CM | POA: Diagnosis not present

## 2023-06-16 DIAGNOSIS — G8929 Other chronic pain: Secondary | ICD-10-CM

## 2023-06-16 MED ORDER — CYCLOBENZAPRINE HCL 10 MG PO TABS
ORAL_TABLET | ORAL | 3 refills | Status: DC
Start: 2023-06-16 — End: 2023-10-04

## 2023-06-16 NOTE — Progress Notes (Signed)
   Subjective:    Patient ID: Penny [name], female    DOB: 1986-06-10, 37 y.o.   MRN: [id number]  HPI Pt comes in today for chronic back pain. T states she just got out of pain management last September and has recently ran out of the muscle relaxer she was given. Pt asked if paperwork for disability.   Review of Systems     Objective:   Physical Exam  Patient has subjective discomfort in her back negative straight leg raise hurts in the mid back hurts in the low back difficulty twisting difficulty squatting bending difficulty getting onto and off exam table      Assessment & Plan:   1. Sciatica of left side Stretching exercises medications as directed  2. Vitamin B12 deficiency Continue B12  3. Other fatigue Patient filing for disability due to chronic back pain anxiety depression issues  4. Chronic right-sided low back pain with right-sided sciatica Muscle relaxers as directed caution drowsiness Home use only not with driving - cyclobenzaprine (FLEXERIL) 10 MG tablet; One bid prn back spasms caution drowsiness  Dispense: 60 tablet; Refill: 3

## 2023-06-17 ENCOUNTER — Inpatient Hospital Stay: Payer: MEDICAID | Attending: Physician Assistant

## 2023-07-07 ENCOUNTER — Other Ambulatory Visit: Payer: Self-pay | Admitting: Family Medicine

## 2023-07-11 ENCOUNTER — Encounter: Payer: Self-pay | Admitting: Neurology

## 2023-07-11 ENCOUNTER — Ambulatory Visit: Payer: Medicaid Other | Admitting: Neurology

## 2023-07-11 DIAGNOSIS — Z029 Encounter for administrative examinations, unspecified: Secondary | ICD-10-CM

## 2023-07-18 IMAGING — MR MR LUMBAR SPINE W/O CM
4 of 5 series · 26 of 48 positions shown · non-contrast
Comparison: CT lumbar spine 07/29/2021, MR lumbar spine 06/26/2020
and 06/11/2017

CLINICAL DATA: Low back pain and weakness. Numbness. Rule out cord
compression.

EXAM:
MRI LUMBAR SPINE WITHOUT CONTRAST
TECHNIQUE: Multiplanar, multisequence MR imaging of the lumbar spine was
performed. No intravenous contrast was administered.

[Series 9: T2 · sagittal · 4.0mm · 0.73mm/px · 6 of 16 slices shown (1 of 2)]
[im 1/16]
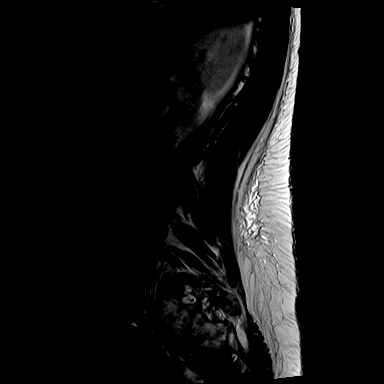
[im 4/16]
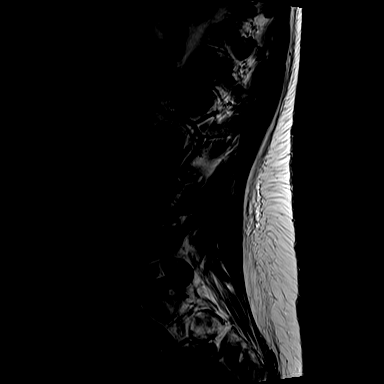
[im 7/16]
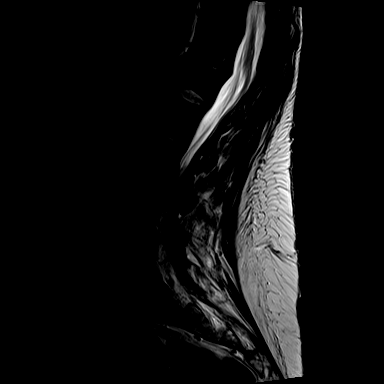
[im 10/16]
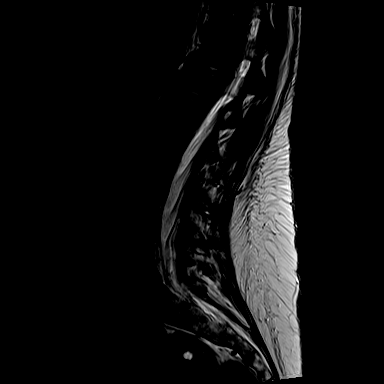
[im 13/16]
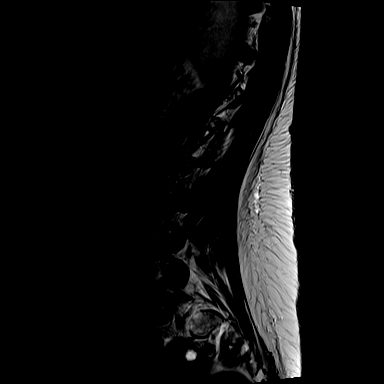
[im 16/16]
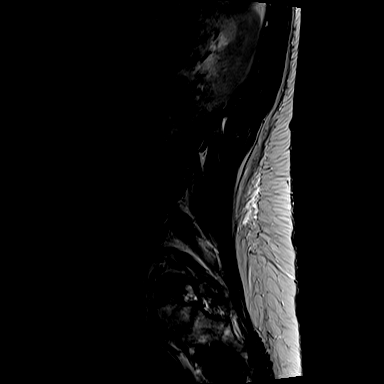

[Series 11: T1 · sagittal · 4.0mm · 0.88mm/px · 6 of 16 slices shown (1 of 2)]
[im 1/16]
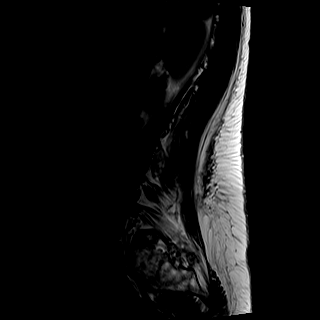
[im 4/16]
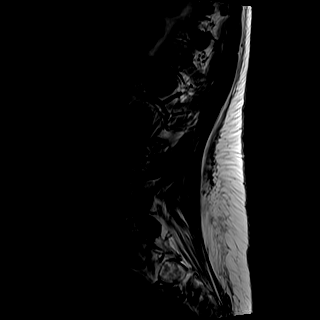
[im 7/16]
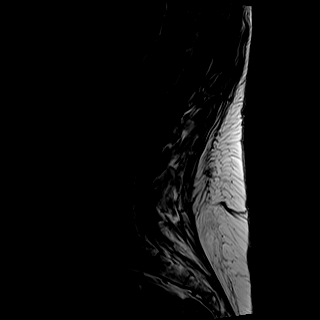
[im 10/16]
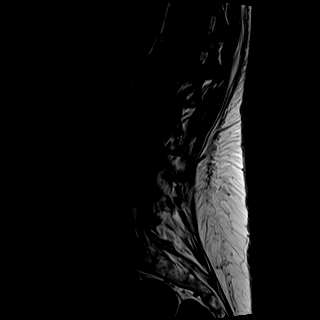
[im 13/16]
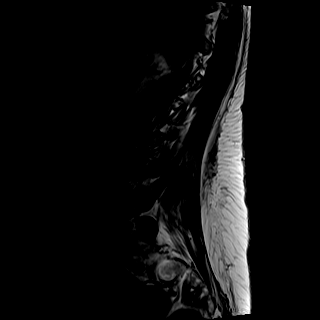
[im 16/16]
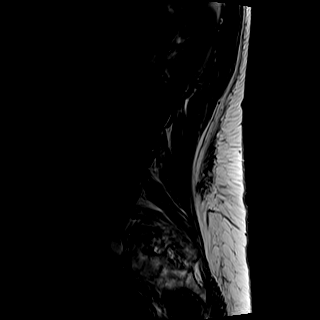

[Series 12: T2 · axial · 4.0mm · 0.57mm/px · z∈[-112,+124]mm · 9 of 41 slices shown (2 of 2)]
[im 1/41]
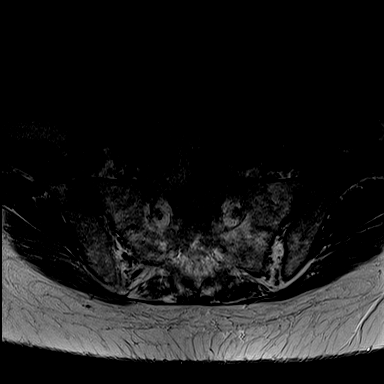
[im 6/41]
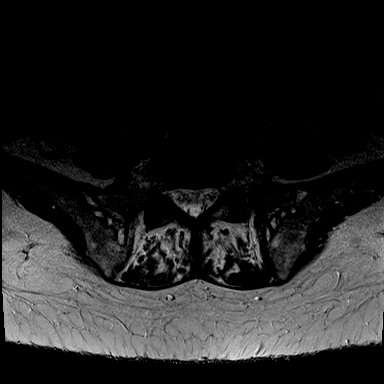
[im 12/41]
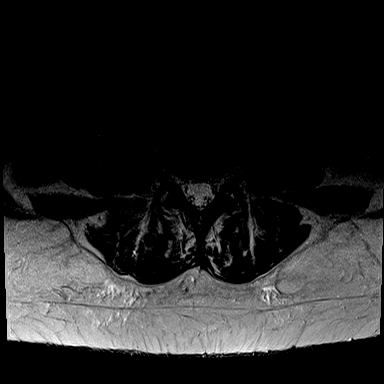
[im 18/41]
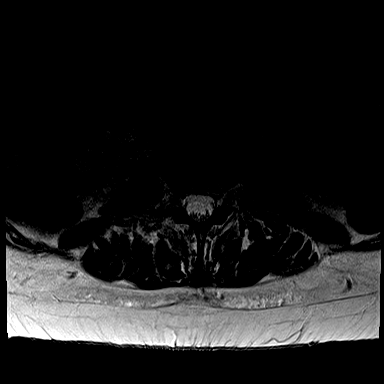
[im 21/41]
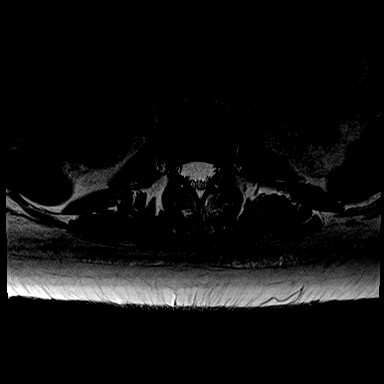
[im 23/41]
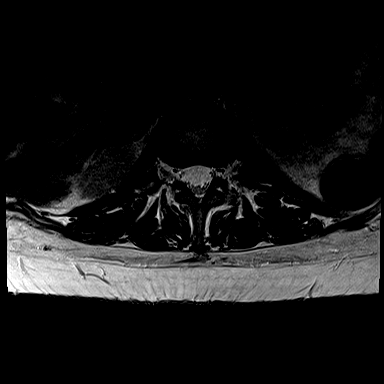
[im 29/41]
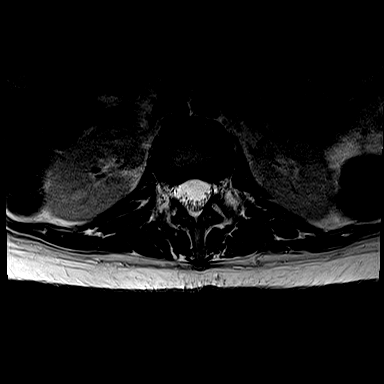
[im 35/41]
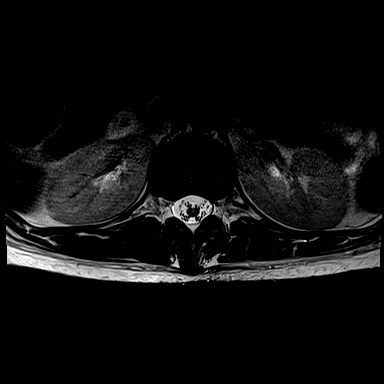
[im 41/41]
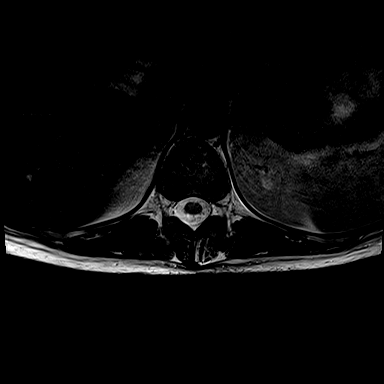

[Series 13: T1 · axial · 4.0mm · 0.34mm/px · z∈[-112,+95]mm · 5 of 41 slices shown (2 of 2)]
[im 1/41]
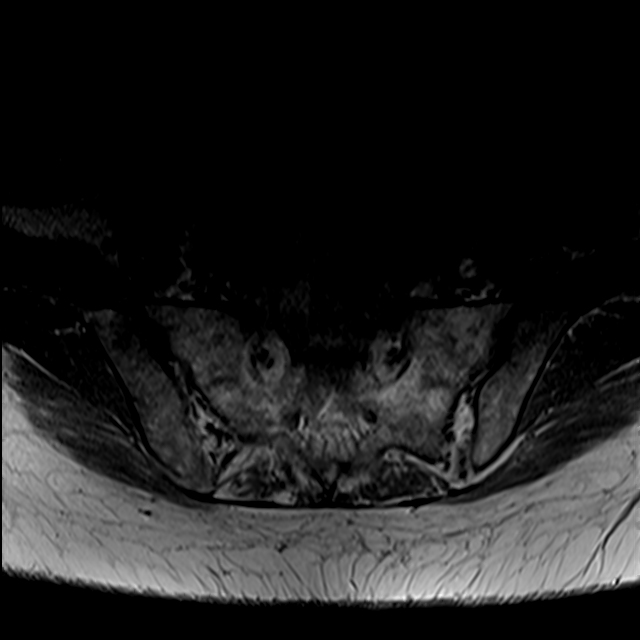
[im 6/41]
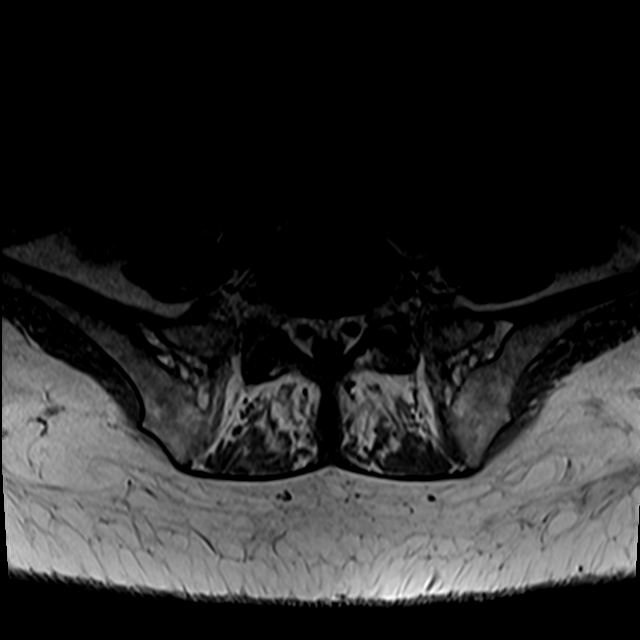
[im 12/41]
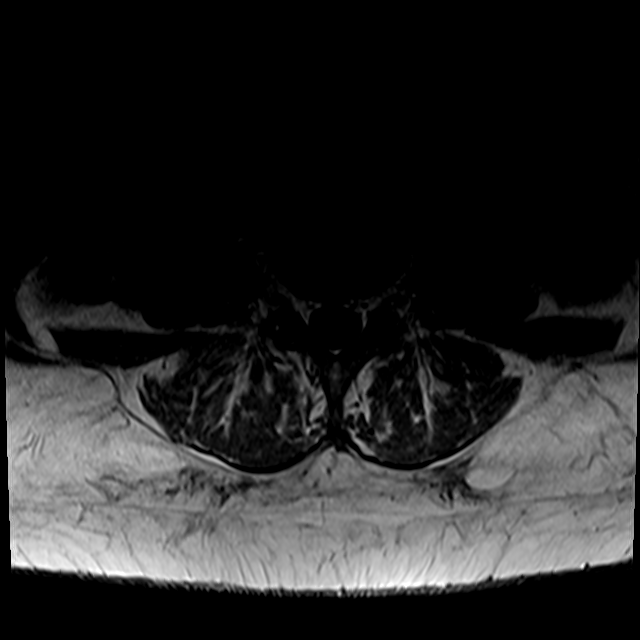
[im 21/41]
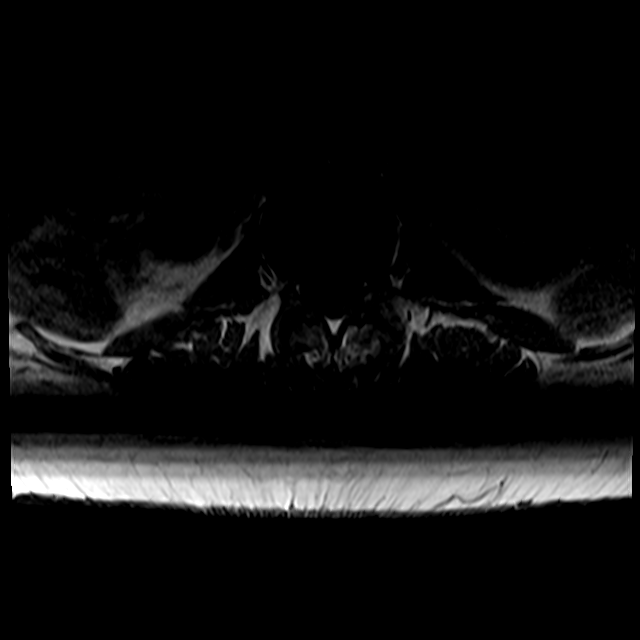
[im 35/41]
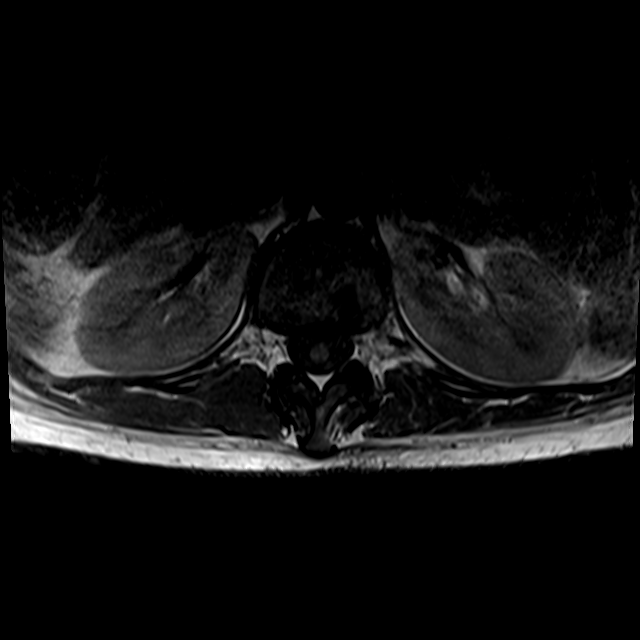

[26 of 48 positions shown; findings below may reference images not displayed]

FINDINGS: Segmentation:  Standard.

Alignment:  No static listhesis. Mild kyphosis centered at T12-L1.

Vertebrae: No acute fracture, evidence of discitis, or aggressive
bone lesion. Mild chronic T12 vertebral body compression fracture
with methylmethacrylate within the T12 vertebral body from prior
augmentation. Chronic L1 vertebral body compression fracture with
approximately 60% height loss. T1 and T2 hypointense 11 mm left
sacral bone lesion unchanged compared with 06/11/2017 likely
reflecting a benign fibro-osseous lesion.

Conus medullaris and cauda equina: Conus extends to the T12 level.
Conus and cauda equina appear normal.

Paraspinal and other soft tissues: No acute paraspinal abnormality.

Disc levels:

Disc spaces:

T12-L1: Degenerative disease with disc height loss at L3-4 and L4-5.

L1-L2: No significant disc bulge. No neural foraminal stenosis. No
central canal stenosis.

L2-L3: No significant disc bulge. No neural foraminal stenosis. No
central canal stenosis.

L3-L4: Minimal broad-based disc bulge with a right paracentral
annular fissure. No foraminal or central canal stenosis.

L4-L5: Broad-based disc bulge with a small central/right paracentral
disc protrusion. No foraminal or central canal stenosis.

L5-S1: No significant disc bulge. No neural foraminal stenosis. No
central canal stenosis.
IMPRESSION: 1. No acute osseous injury of the lumbar spine.
2. Mild lumbar spine spondylosis as described above.
3. No evidence of cord compression.

## 2023-07-18 IMAGING — MR MR HEAD W/O CM
12 of 13 series · 44 of 48 positions shown · non-contrast
Comparison: Head CT 3319 hours today.  Brain MRI 01/27/2021.

CLINICAL DATA: 34-year-old female with right leg weakness and
numbness.

EXAM:
MRI HEAD WITHOUT CONTRAST
TECHNIQUE: Multiplanar, multiecho pulse sequences of the brain and surrounding
structures were obtained without intravenous contrast.

[Series 5: DWI · axial · 3.0mm · 0.88mm/px · z∈[-131,-2]mm · 8 of 102 slices shown (1 of 4)]
[im 1/102]
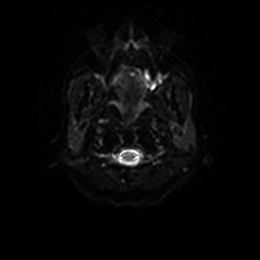
[im 15/102]
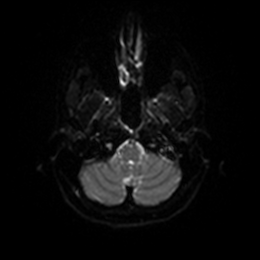
[im 29/102]
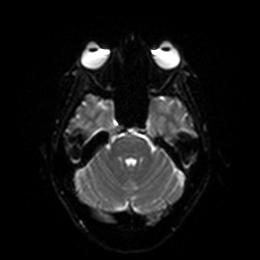
[im 44/102]
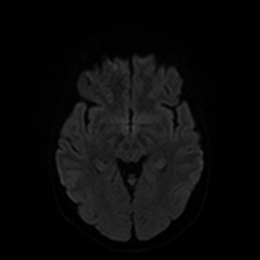
[im 58/102]
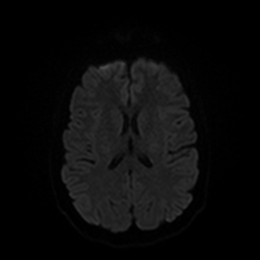
[im 73/102]
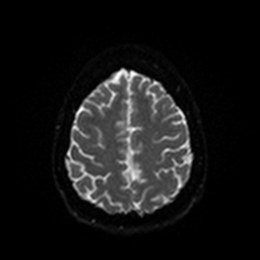
[im 87/102]
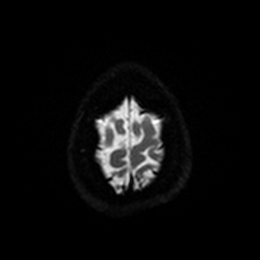
[im 102/102]
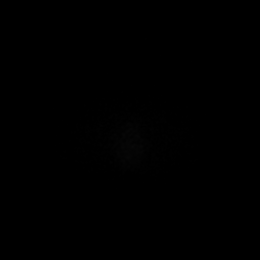

[Series 6: DWI · axial · 3.0mm · 0.88mm/px · z∈[-131,-2]mm · 4 of 51 slices shown (2 of 4)]
[im 1/51]
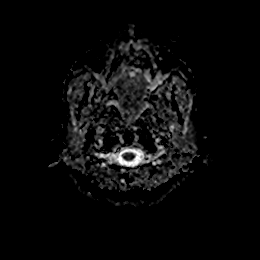
[im 17/51]
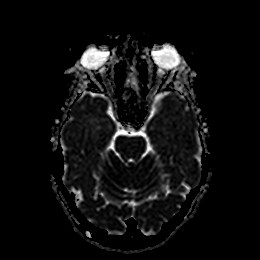
[im 34/51]
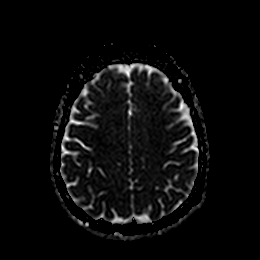
[im 51/51]
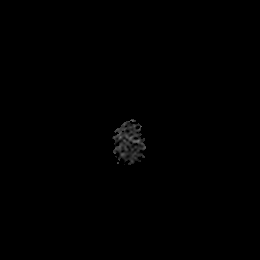

[Series 7: DWI · coronal · 4.0mm · 0.88mm/px · 5 of 70 slices shown (3 of 4)]
[im 1/70]
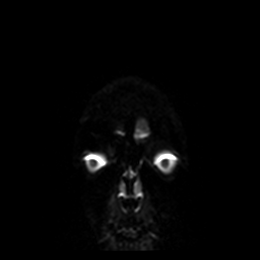
[im 18/70]
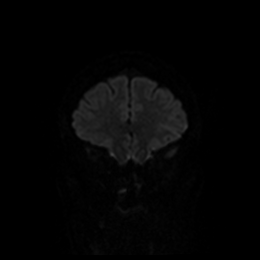
[im 35/70]
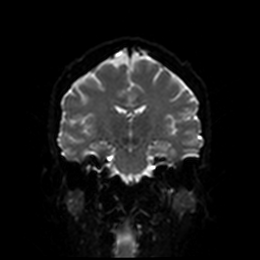
[im 52/70]
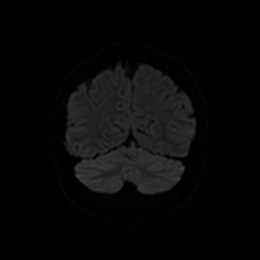
[im 70/70]
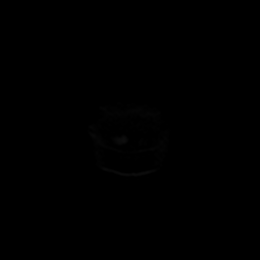

[Series 8: DWI · coronal · 4.0mm · 0.88mm/px · 3 of 35 slices shown (4 of 4)]
[im 1/35]
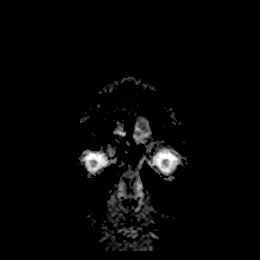
[im 18/35]
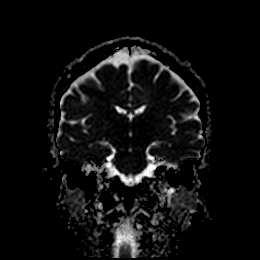
[im 35/35]
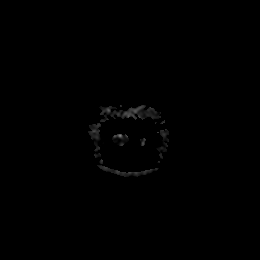

[Series 9: T1 · sagittal · 5.0mm · 0.75mm/px · 2 of 23 slices shown]
[im 1/23]
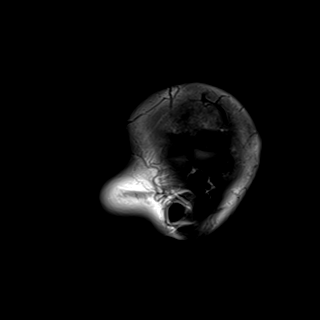
[im 23/23]
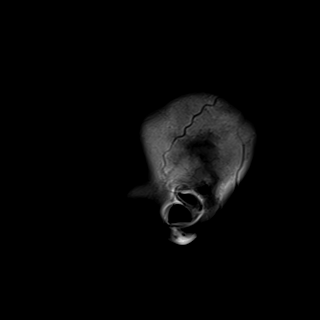

[Series 10: T2 · axial · 5.0mm · 0.72mm/px · z∈[-129,-5]mm · 2 of 25 slices shown (1 of 2)]
[im 1/25]
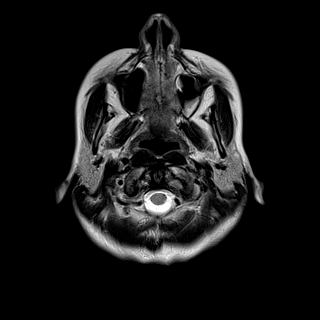
[im 25/25]
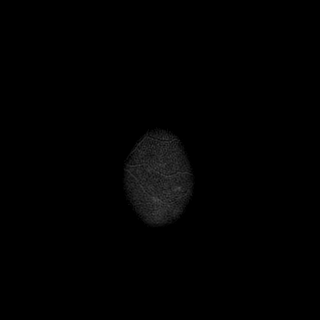

[Series 11: FLAIR · axial · 5.0mm · 0.45mm/px · z∈[-128,-4]mm · 2 of 25 slices shown]
[im 1/25]
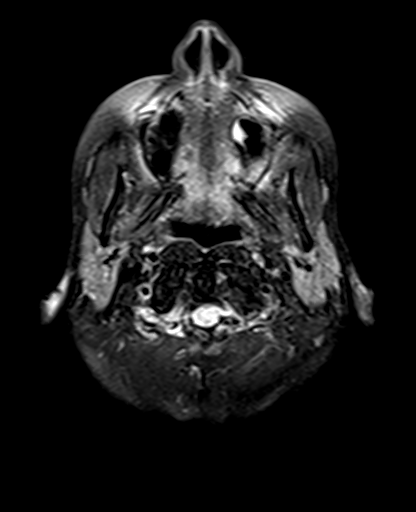
[im 25/25]
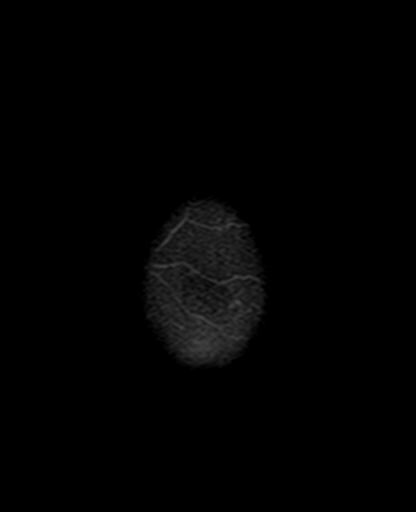

[Series 12: mag_images · axial · 3.0mm · 0.90mm/px · z∈[-132,-0]mm · 4 of 52 slices shown]
[im 1/52]
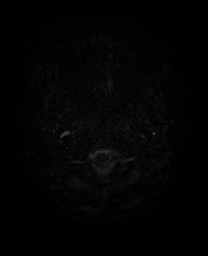
[im 18/52]
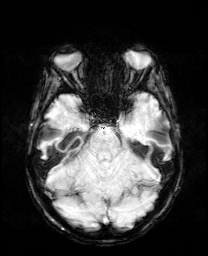
[im 35/52]
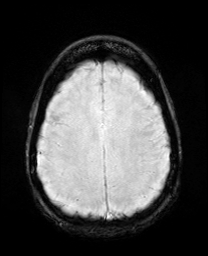
[im 52/52]
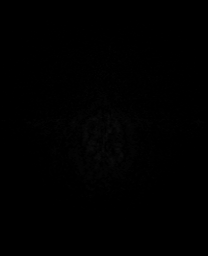

[Series 13: pha_images · axial · 3.0mm · 0.90mm/px · z∈[-129,-3]mm · 4 of 50 slices shown]
[im 1/50]
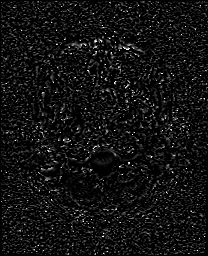
[im 17/50]
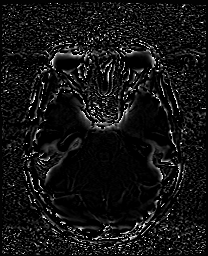
[im 33/50]
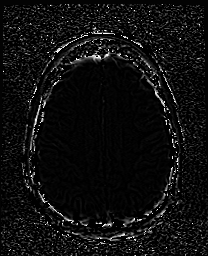
[im 50/50]
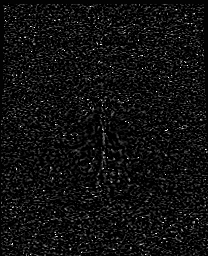

[Series 14: swi_images · axial · 3.0mm · 0.90mm/px · z∈[-132,-0]mm · 4 of 52 slices shown]
[im 1/52]
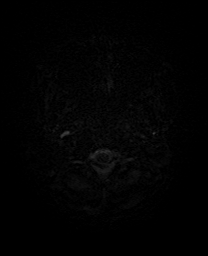
[im 18/52]
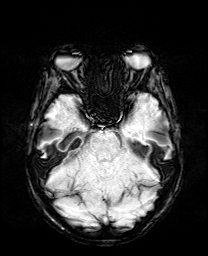
[im 35/52]
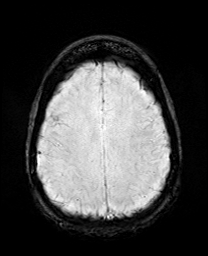
[im 52/52]
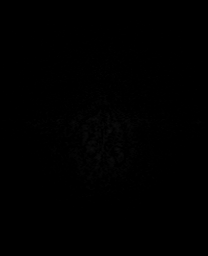

[Series 15: mip_images(sw) · axial · 24.0mm · 0.90mm/px · z∈[-123,-9]mm · 4 of 45 slices shown]
[im 1/45]
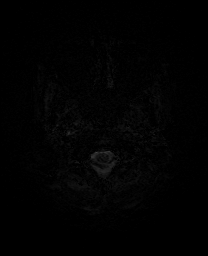
[im 15/45]
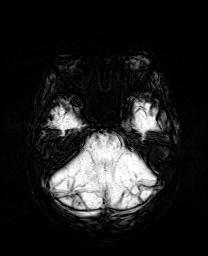
[im 30/45]
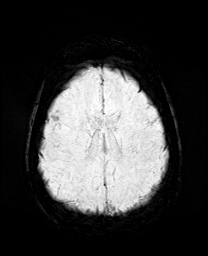
[im 45/45]
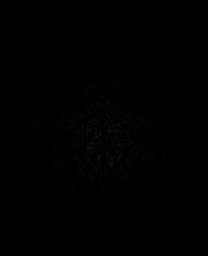

[Series 17: T2 · coronal · 5.0mm · 0.34mm/px · 2 of 29 slices shown (2 of 2)]
[im 1/29]
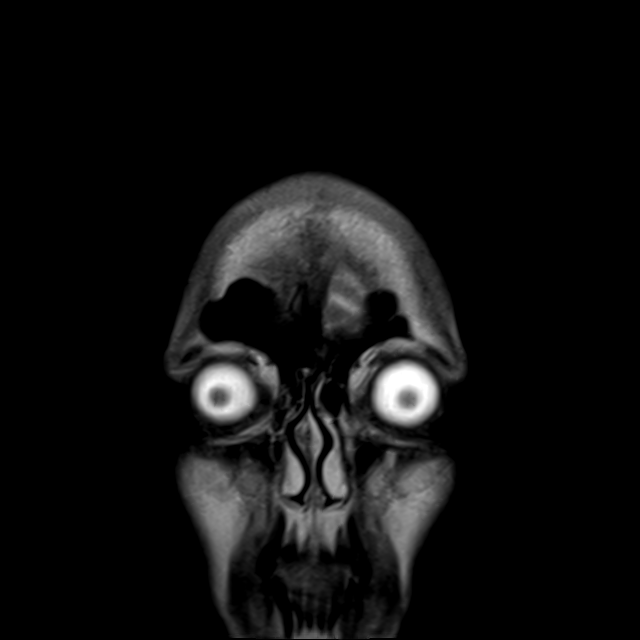
[im 29/29]
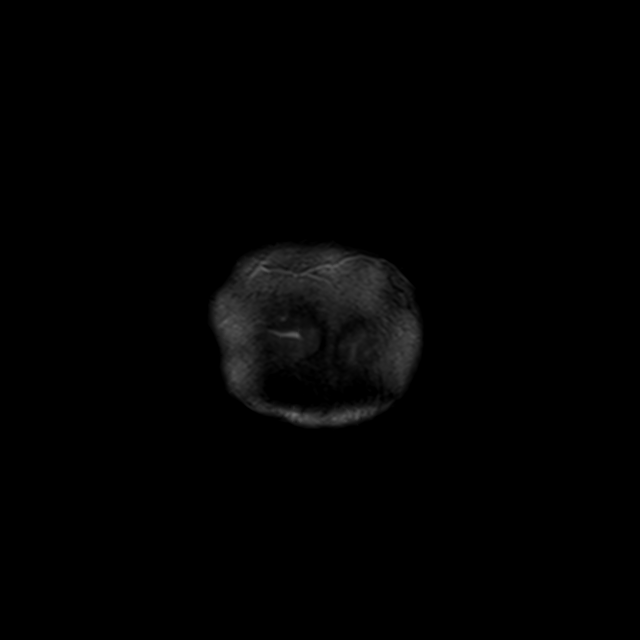

[44 of 48 positions shown; findings below may reference images not displayed]

FINDINGS: Brain: No restricted diffusion to suggest acute infarction. No
midline shift, mass effect, evidence of mass lesion,
ventriculomegaly, extra-axial collection or acute intracranial
hemorrhage. Cervicomedullary junction and pituitary are within
normal limits.

Gray and white matter signal is stable and within normal limits
throughout the brain. No encephalomalacia or chronic cerebral blood
products identified.

Vascular: Major intracranial vascular flow voids are stable since
[REDACTED].

Skull and upper cervical spine: Visualized bone marrow signal is
within normal limits. Stable and negative visible cervical spine.

Sinuses/Orbits: Negative orbits. New mucosal thickening or small
mucous retention cysts in the sphenoid sinuses since [REDACTED]. Other
paranasal sinuses and mastoids are clear.

Other: Grossly normal visible internal auditory structures. Negative
visible scalp and face.
IMPRESSION: No acute intracranial abnormality. Stable and normal noncontrast MRI
appearance of the brain.

## 2023-08-10 ENCOUNTER — Other Ambulatory Visit: Payer: Self-pay | Admitting: Family Medicine

## 2023-08-31 ENCOUNTER — Encounter: Payer: Self-pay | Admitting: Physician Assistant

## 2023-09-12 ENCOUNTER — Telehealth: Payer: Self-pay | Admitting: Family Medicine

## 2023-09-12 NOTE — Telephone Encounter (Signed)
I was able to look through her previous lab notes with other doctors I would recommend a metabolic 7, PTH, vitamin D due to hypocalcemia.

## 2023-09-12 NOTE — Telephone Encounter (Signed)
Patient was seen 06/16/23 and thought you wanted her to have labs done, She is going tomorrow to have some done for her cancer doctor and wanting to know if she need any done for  you.to review.Please advise

## 2023-09-13 ENCOUNTER — Inpatient Hospital Stay: Payer: MEDICAID | Attending: Physician Assistant | Admitting: Oncology

## 2023-09-13 DIAGNOSIS — D508 Other iron deficiency anemias: Secondary | ICD-10-CM | POA: Insufficient documentation

## 2023-09-13 DIAGNOSIS — K909 Intestinal malabsorption, unspecified: Secondary | ICD-10-CM | POA: Insufficient documentation

## 2023-09-13 DIAGNOSIS — R202 Paresthesia of skin: Secondary | ICD-10-CM | POA: Insufficient documentation

## 2023-09-13 DIAGNOSIS — R200 Anesthesia of skin: Secondary | ICD-10-CM | POA: Insufficient documentation

## 2023-09-13 DIAGNOSIS — Z9884 Bariatric surgery status: Secondary | ICD-10-CM | POA: Diagnosis not present

## 2023-09-13 LAB — CBC WITH DIFFERENTIAL/PLATELET
Abs Immature Granulocytes: 0.03 10*3/uL (ref 0.00–0.07)
Basophils Absolute: 0 10*3/uL (ref 0.0–0.1)
Basophils Relative: 1 %
Eosinophils Absolute: 0.3 10*3/uL (ref 0.0–0.5)
Eosinophils Relative: 5 %
HCT: 42.3 % (ref 36.0–46.0)
Hemoglobin: 14.2 g/dL (ref 12.0–15.0)
Immature Granulocytes: 1 %
Lymphocytes Relative: 26 %
Lymphs Abs: 1.7 10*3/uL (ref 0.7–4.0)
MCH: 32.9 pg (ref 26.0–34.0)
MCHC: 33.6 g/dL (ref 30.0–36.0)
MCV: 98.1 fL (ref 80.0–100.0)
Monocytes Absolute: 0.7 10*3/uL (ref 0.1–1.0)
Monocytes Relative: 11 %
Neutro Abs: 3.7 10*3/uL (ref 1.7–7.7)
Neutrophils Relative %: 56 %
Platelets: 262 10*3/uL (ref 150–400)
RBC: 4.31 MIL/uL (ref 3.87–5.11)
RDW: 12.1 % (ref 11.5–15.5)
WBC: 6.4 10*3/uL (ref 4.0–10.5)
nRBC: 0 % (ref 0.0–0.2)

## 2023-09-13 LAB — IRON AND TIBC
Iron: 68 ug/dL (ref 28–170)
Saturation Ratios: 19 % (ref 10.4–31.8)
TIBC: 362 ug/dL (ref 250–450)
UIBC: 294 ug/dL

## 2023-09-13 LAB — FERRITIN: Ferritin: 17 ng/mL (ref 11–307)

## 2023-09-13 LAB — VITAMIN B12: Vitamin B-12: 344 pg/mL (ref 180–914)

## 2023-09-13 NOTE — Telephone Encounter (Signed)
I would recommend holding off on scheduling any additional lab work until those lab tests come back

## 2023-09-15 NOTE — Progress Notes (Signed)
TY   Durenda Hurt, NP 09/15/2023 11:55 AM

## 2023-09-16 ENCOUNTER — Inpatient Hospital Stay: Payer: MEDICAID

## 2023-09-16 ENCOUNTER — Encounter: Payer: Self-pay | Admitting: Family Medicine

## 2023-09-18 NOTE — Telephone Encounter (Signed)
Nurses Not even sure what I am signing for? Would need additional information Would also need to have a follow-up visit I would imagine in order to establish the prescription here If what is being asked for as far outside of our specialty we may have to refer her to urology. Need more clarification about what she is wanting to have refilled From the screenshot I could not tell Thank you

## 2023-09-23 ENCOUNTER — Inpatient Hospital Stay: Payer: MEDICAID

## 2023-09-23 ENCOUNTER — Inpatient Hospital Stay (HOSPITAL_BASED_OUTPATIENT_CLINIC_OR_DEPARTMENT_OTHER): Payer: MEDICAID | Admitting: Oncology

## 2023-09-23 VITALS — BP 138/85 | HR 119 | Temp 98.4°F | Resp 18 | Ht 66.0 in | Wt 170.0 lb

## 2023-09-23 DIAGNOSIS — D508 Other iron deficiency anemias: Secondary | ICD-10-CM | POA: Diagnosis not present

## 2023-09-23 DIAGNOSIS — D500 Iron deficiency anemia secondary to blood loss (chronic): Secondary | ICD-10-CM

## 2023-09-23 MED ORDER — CYANOCOBALAMIN 1000 MCG/ML IJ SOLN
1000.0000 ug | Freq: Once | INTRAMUSCULAR | Status: AC
  Administered 2023-09-23: 1000 ug via INTRAMUSCULAR
  Filled 2023-09-23: qty 1

## 2023-09-23 NOTE — Progress Notes (Unsigned)
[facility] 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   HISTORY OF PRESENTING ILLNESS:  Penny [name] 37 y.o. female returns for a follow up for iron deficiency anemia.  She last received IV Feraheme on 05/19/2023.   Has had side effects with iron infusions back in April  including syncope, seeing spots, headache and nausea but tolerated most recent infusion in June well.  Has noticed some fatigue over the past 2 months. Feels run down. Low energy. Appetite is low. Reports intermittent nausea for which she takes Zofran with relief of her symptoms.  States she is trying to listen to her body and stay on top of her labs to prevent her iron levels from plummeting.  Denies any bleeding, melena, hematochezia or bright red blood per rectum.  Has chronic low back pain but currently does not take any pain medicine due to history of opiate abuse.  She sees the chiropractor weekly which helps.  MEDICAL HISTORY:  Past Medical History:  Diagnosis Date   ADD (attention deficit disorder)    Anemia 2011   2o to GASTRIC BYPASS   Anginal pain (HCC)    Anxiety    Asthma    Blood transfusion without reported diagnosis    Chronic daily headache    Depression    Depression    Dysmenorrhea 09/18/2013   Dysrhythmia    Elevated liver enzymes JUL 2011ALK PHOS 111-127 AST  143-267 ALT  213-321T BILI 0.6  ALB  3.7-4.06 Jun 2011 ALK PHOS 118 AST 24 ALT 42 T BILI 0.4 ALB 3.9   Encounter for drug rehabilitation    at behavioral health for opioid addiction about 4 years ago    Family history of adverse reaction to anesthesia    'dad had to be kept on pump for breathing for morphine'   Fatty liver    Fibroid 01/18/2017   Fibromyalgia    Gastric bypass status for obesity    Gastritis JULY 2011   Heart murmur    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   History of Holter monitoring    Hx of opioid abuse (HCC)    for about 4 years, about 4 years ago   Interstitial cystitis    Iron deficiency anemia 07/23/2010   Irritable bowel syndrome 2012 DIARRHEA   JUN 2012 TTG IgA 14.9   IUD FEB 2010   Lupus (HCC)    Menorrhagia 07/18/2013   Migraines    Obesity (BMI 30-39.9) 2011 228 LBS BMI 36.8   Osteoporosis    Ovarian cyst    Patient desires pregnancy 09/18/2013   Polyneuropathy    PONV (postoperative nausea and vomiting) seizure post-operatively   seizure following ankle surgery   Potassium (K) deficiency    Pregnant 12/25/2013   Psychiatric pseudoseizure    RLQ abdominal pain 07/18/2013   Sciatica of left side 11/14/2014   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   Stress 09/18/2013    SURGICAL HISTORY: Past Surgical History:  Procedure Laterality Date   BIOPSY  04/10/2015   Procedure: BIOPSY;  Surgeon: Corbin Ade, MD;  Location: [facility];  Service: Endoscopy;;   BIOPSY  04/15/2021   Procedure: BIOPSY;  Surgeon: Marguerita Merles, Reuel Boom, MD;  Location: [facility];  Service: Gastroenterology;;   cath self every nite     for sodium bicarb injection (discontinued 2013)   CHOLECYSTECTOMY  2005   biliary dyskinesia   COLONOSCOPY  JUN 2012 ABD PN/DIARRHEA WITH PROPOFOL   NL COLON   COLONOSCOPY WITH PROPOFOL N/A 01/16/2021   Procedure: COLONOSCOPY WITH PROPOFOL;  Surgeon: Dolores Frame, MD;  [facility]: [facility];  Service: Gastroenterology;  Laterality: N/A;  1:45   DILATION AND CURETTAGE OF UTERUS     DILITATION & CURRETTAGE/HYSTROSCOPY WITH NOVASURE ABLATION N/A 03/24/2017   Procedure: DILATATION & CURETTAGE/HYSTEROSCOPY  WITH NOVASURE ENDOMETRIAL ABLATION;  Surgeon: Tilda Burrow, MD;  Location: [facility];  Service: Gynecology;  Laterality: N/A;   ESOPHAGEAL DILATION N/A 04/10/2015   Procedure: ESOPHAGEAL DILATION WITH 54FR MALONEY DILATOR;  Surgeon: Corbin Ade, MD;  Location: [facility];  Service: Endoscopy;  Laterality: N/A;   ESOPHAGOGASTRODUODENOSCOPY     ESOPHAGOGASTRODUODENOSCOPY (EGD) WITH PROPOFOL N/A 04/10/2015   Procedure: ESOPHAGOGASTRODUODENOSCOPY (EGD) WITH PROPOFOL;  Surgeon: Corbin Ade, MD;  Location: [facility];  Service: Endoscopy;  Laterality: N/A;   ESOPHAGOGASTRODUODENOSCOPY (EGD) WITH PROPOFOL N/A 12/06/2016   Procedure: ESOPHAGOGASTRODUODENOSCOPY (EGD) WITH PROPOFOL;  Surgeon: West Bali, MD;  [facility];  Service: Endoscopy;  Laterality: N/A;   ESOPHAGOGASTRODUODENOSCOPY (EGD) WITH PROPOFOL N/A 04/27/2019   Procedure: ESOPHAGOGASTRODUODENOSCOPY (EGD) WITH PROPOFOL;  Surgeon: Malissa Hippo, MD;  [facility];  Service: Endoscopy;  Laterality: N/A;  730   ESOPHAGOGASTRODUODENOSCOPY (EGD) WITH PROPOFOL N/A 08/31/2019   Procedure: ESOPHAGOGASTRODUODENOSCOPY (EGD) WITH PROPOFOL;  Surgeon: Malissa Hippo, MD;  [facility];  Service: Endoscopy;  Laterality: N/A;   ESOPHAGOGASTRODUODENOSCOPY (EGD) WITH PROPOFOL N/A 01/16/2021   Procedure: ESOPHAGOGASTRODUODENOSCOPY (EGD) WITH PROPOFOL;  Surgeon: Dolores Frame, MD;  [facility];  Service: Gastroenterology;  Laterality: N/A;   ESOPHAGOGASTRODUODENOSCOPY (EGD) WITH PROPOFOL N/A 04/15/2021   Procedure: ESOPHAGOGASTRODUODENOSCOPY (EGD) WITH PROPOFOL;  Surgeon: Dolores Frame, MD;  [facility];  Service: Gastroenterology;  Laterality: N/A;  9:00 AM   GAB  2007   in [place]-POUCH 5 CM   GASTRIC BYPASS  06/2006   HYSTEROSCOPY WITH D & C N/A 09/12/2014   Procedure: DILATATION AND CURETTAGE /HYSTEROSCOPY;  Surgeon: Tilda Burrow, MD;  Location: [facility];  Service: Gynecology;   Laterality: N/A;   KYPHOPLASTY N/A 08/02/2018   Procedure: KYPHOPLASTY T12;  Surgeon: Venita Lick, MD;  Location: [facility];  Service: Orthopedics;  Laterality: N/A;  60 mins   LAPAROSCOPIC TUBAL LIGATION Bilateral 03/24/2017   Procedure: LAPAROSCOPIC TUBAL LIGATION ([place] Rings);  Surgeon: Tilda Burrow, MD;  Location: [facility];  Service: Gynecology;  Laterality: Bilateral;   REPAIR VAGINAL CUFF N/A 07/30/2014   Procedure: REPAIR VAGINAL CUFF;  Surgeon: Catalina Antigua, MD;  Location: [facility];  Service: Gynecology;  Laterality: N/A;   SAVORY DILATION  06/20/2012   Dr. Cyndi Bender gastritis/Ulcer in the mid jejunum. Empiric dilation.    SURAL NERVE BX Left 02/25/2016   Procedure: LEFT SURAL NERVE BIOPSY;  Surgeon: Shirlean Kelly, MD;  Location: [facility];  Service: Neurosurgery;  Laterality: Left;  Left sural nerve biopsy   TONSILLECTOMY     TONSILLECTOMY AND ADENOIDECTOMY     UPPER GASTROINTESTINAL ENDOSCOPY  JULY 2011 NAUSEA-D125,V6, PH 25   Bx; GASTRITIS, POUCH-5 CM LONG   WISDOM TOOTH EXTRACTION  SOCIAL HISTORY: Social History   Socioeconomic History   Marital status: Divorced    Spouse name: Not on file   Number of children: 2   Years of education: some college   Highest education level: Some college, no degree  Occupational History   Occupation: Applying for Social Security Disability    Employer: NOT EMPLOYED  Tobacco Use   Smoking status: Former    Current packs/day: 0.25    Average packs/day: 0.3 packs/day for 6.3 years (1.6 ttl pk-yrs)    Types: Cigarettes    Start date: 05/2018   Smokeless tobacco: Never  Vaping Use   Vaping status: Every Day  Substance and Sexual Activity   Alcohol use: Not Currently   Drug use: Yes    Types: Marijuana   Sexual activity: Yes    Partners: Male    Birth control/protection: Surgical    Comment: tubal and ablation  Other Topics Concern   Not on file  Social History Narrative   Patient is right handed.   Patient drinks one  cup of coffee daily.   She lives in a one-story home with her two children (42 year old son, 29 month old daughter).   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 [employer]: Medium Risk (08/25/2021)   Overall Financial Resource Strain (CARDIA)    Difficulty of Paying Living Expenses: Somewhat hard  Food Insecurity: Food Insecurity Present (08/25/2021)   Hunger Vital Sign    Worried About Running Out of Food in the Last Year: Sometimes true    Ran Out of Food in the Last Year: Sometimes true  Transportation Needs: No Transportation Needs (08/25/2021)   PRAPARE - Administrator, Civil Service (Medical): No    Lack of Transportation (Non-Medical): No  Physical Activity: Inactive (08/25/2021)   Exercise Vital Sign    Days of Exercise per Week: 0 days    Minutes of Exercise per Session: 10 min  Stress: Stress Concern Present (08/25/2021)   [employer] of Occupational Health - Occupational Stress Questionnaire    Feeling of Stress : Very much  Social Connections: Unknown (04/04/2022)   Received from [facility], [facility]   Social Network    Social Network: Not on file  Intimate Partner Violence: Unknown (03/09/2022)   Received from [employer], [facility]   HITS    Physically Hurt: Not on file    Insult or Talk Down To: Not on file    Threaten Physical Harm: Not on file    Scream or Curse: Not on file    FAMILY HISTORY: Family History  Problem Relation Age of Onset   Coronary artery disease Paternal Grandfather    Migraines Paternal Grandmother    Breast cancer Paternal Grandmother    Hemochromatosis Maternal Grandmother    Migraines Maternal Grandmother    Cancer Maternal Grandmother    Breast cancer Maternal Grandmother    Hypertension Father    Diabetes Father    Coronary artery disease Father    Cancer Mother        breast   Hemochromatosis Mother     Breast cancer Mother    Depression Mother    Anxiety disorder Mother    Anxiety disorder Brother    Bipolar disorder Brother    Healthy Daughter    Healthy Son     ALLERGIES:  is allergic to  gabapentin, metoclopramide hcl, tramadol, ativan [lorazepam], latex, lyrica [pregabalin], iron, sulfa antibiotics, butrans [buprenorphine], nucynta [tapentadol], propofol, tape, and toradol [ketorolac tromethamine].  MEDICATIONS:  Current Outpatient Medications  Medication Sig Dispense Refill   ADDERALL XR 30 MG 24 hr capsule TAKE 1 CAPSULE BY MOUTH ONCE EVERY MORNING. 30 capsule 0   azelastine (ASTELIN) 0.1 % nasal spray spray 2 spray by intranasal route 2 times every day in each nostril     budesonide-formoterol (SYMBICORT) 160-4.5 MCG/ACT inhaler Inhale into the lungs.     Calcium Carb-Cholecalciferol (CALCIUM-VITAMIN D3) 600-500 MG-UNIT CAPS Take 1 tablet by mouth daily.     cetirizine (ZYRTEC) 10 MG tablet TAKE 1 TABLET BY MOUTH ONCE DAILY 30 tablet 0   clonazePAM (KLONOPIN) 0.5 MG tablet TAKE 1 TABLET BY MOUTH THREE TIMES DAILY AS NEEDED. 90 tablet 0   cloNIDine (CATAPRES) 0.1 MG tablet Take 0.1 mg by mouth at bedtime.     cyclobenzaprine (FLEXERIL) 10 MG tablet One bid prn back spasms caution drowsiness 60 tablet 3   DULoxetine (CYMBALTA) 60 MG capsule Take 1 capsule (60 mg total) by mouth 2 (two) times daily. 180 capsule 2   Erenumab-aooe (AIMOVIG) 140 MG/ML SOAJ INJECT 1 PEN INTO THE SKIN ONCE EVERY 28 DAYS. 1 mL 11   levalbuterol (XOPENEX HFA) 45 MCG/ACT inhaler INHALE (2) PUFFS INTO THE LUNGS EVERY SIX HOURS AS NEEDED FOR WHEEZING. 15 g 3   ondansetron (ZOFRAN-ODT) 8 MG disintegrating tablet Take 8 mg by mouth every 8 (eight) hours as needed.     pantoprazole (PROTONIX) 40 MG tablet TAKE ONE TABLET BY MOUTH ONCE DAILY. 30 tablet 0   propranolol (INDERAL) 10 MG tablet Take 10 mg by mouth 3 (three) times daily.     Rimegepant Sulfate (NURTEC) 75 MG TBDP TAKE 1 TABLET BY MOUTH AS NEEDED. MAX  TWICE WEEKLY. 8 tablet 11   valACYclovir (VALTREX) 1000 MG tablet TAKE 1 TABLET BY MOUTH DAILY. 30 tablet PRN   Vitamin D, Ergocalciferol, (DRISDOL) 1.25 MG (50000 UNIT) CAPS capsule Take 50,000 Units by mouth every Wednesday.     No current facility-administered medications for this visit.    REVIEW OF SYSTEMS:   Review of Systems  Constitutional:  Positive for malaise/fatigue.  Gastrointestinal:  Positive for nausea. Negative for blood in stool.  Musculoskeletal:  Positive for back pain.  Neurological:  Positive for tingling and sensory change.  Psychiatric/Behavioral:  Positive for depression. The patient is nervous/anxious.     PHYSICAL EXAMINATION: ECOG PERFORMANCE STATUS: 1 - Symptomatic but completely ambulatory  There were no vitals filed for this visit.  There were no vitals filed for this visit. Physical Exam Constitutional:      General: She is not in acute distress.    Appearance: Normal appearance.  Cardiovascular:     Rate and Rhythm: Normal rate and regular rhythm.  Pulmonary:     Effort: Pulmonary effort is normal.     Breath sounds: Normal breath sounds.  Abdominal:     General: Bowel sounds are normal.     Palpations: Abdomen is soft.  Musculoskeletal:        General: No swelling. Normal range of motion.  Neurological:     Mental Status: She is alert and oriented to person, place, and time. Mental status is at baseline.      LABORATORY DATA:  I have reviewed the data as listed Recent Results (from the past 2160 hour(s))  Iron and TIBC ([facility] ONLY)     Status:  None   Collection Time: 09/13/23  8:14 AM  Result Value Ref Range   Iron 68 28 - 170 ug/dL   TIBC 161 096 - 045 ug/dL   Saturation Ratios 19 10.4 - 31.8 %   UIBC 294 ug/dL    Comment: Performed at [facility], [address] [place], [place] [address]  Ferritin     Status: None   Collection Time: 09/13/23  8:14 AM  Result Value Ref Range   Ferritin 17 11 - 307 ng/mL     Comment: Performed at [facility], [address], [place], [place] [address]  Vitamin B12     Status: None   Collection Time: 09/13/23  8:14 AM  Result Value Ref Range   Vitamin B-12 344 180 - 914 pg/mL    Comment: (NOTE) This assay is not validated for testing neonatal or myeloproliferative syndrome specimens for Vitamin B12 levels. Performed at [facility], [address] [place], [place] [address]   CBC with Differential     Status: None   Collection Time: 09/13/23  8:14 AM  Result Value Ref Range   WBC 6.4 4.0 - 10.5 K/uL   RBC 4.31 3.87 - 5.11 MIL/uL   Hemoglobin 14.2 12.0 - 15.0 g/dL   HCT 21.3 08.6 - 57.8 %   MCV 98.1 80.0 - 100.0 fL   MCH 32.9 26.0 - 34.0 pg   MCHC 33.6 30.0 - 36.0 g/dL   RDW 46.9 62.9 - 52.8 %   Platelets 262 150 - 400 K/uL   nRBC 0.0 0.0 - 0.2 %   Neutrophils Relative % 56 %   Neutro Abs 3.7 1.7 - 7.7 K/uL   Lymphocytes Relative 26 %   Lymphs Abs 1.7 0.7 - 4.0 K/uL   Monocytes Relative 11 %   Monocytes Absolute 0.7 0.1 - 1.0 K/uL   Eosinophils Relative 5 %   Eosinophils Absolute 0.3 0.0 - 0.5 K/uL   Basophils Relative 1 %   Basophils Absolute 0.0 0.0 - 0.1 K/uL   Immature Granulocytes 1 %   Abs Immature Granulocytes 0.03 0.00 - 0.07 K/uL    Comment: Performed at [facility], [address], [place], [place] [address]     RADIOGRAPHIC STUDIES: I have personally reviewed the radiological images as listed and agreed with the findings in the report. No results found.  ASSESSMENT:  Iron deficiency anemia: - History of gastric bypass surgery in 2007 - Requiring intermittent IV iron since 2009 - EGD (04/15/2021): Pepper mesh appearance of mucosa in the esophagus.  Gastric bypass with about 5 cm in length and intact staple line.  GJ anastomosis with healthy mucosa.  Normal jejunum. - Colonoscopy (01/16/2021): Preparation of the colon inadequate.  Hemorrhoids, no large masses/polyps/old blood. - She is on oral B12 and vitamin D2 pills since  gastric bypass surgery.  Social/family history: - She is a single mother.  She used to work for EMS/fire department.  In 2019, she stopped working due to work-related injury.  She is non-smoker. - Maternal grandmother had hemochromatosis and breast cancer.  Mother had hemochromatosis and breast cancer.  Maternal grandfather had kidney cancer.  Maternal uncle had liver cancer.   PLAN:  Iron deficiency anemia: - Most likely from malabsorption of iron from gastric bypass surgery. -Labs from 09/13/2023 show hemoglobin of 14.2, ferritin 17 with iron saturation 19%. -Recommend IV Feraheme 510 mg once a week x 2. -Recommend premeds with Compazine and half liter of fluids to minimize side effects.  Reports tolerating last infusion well with these premeds.  2. Tingling/Numbness in Fingertips: -Most recent B12 level is 344(307). -Discussed B12 injections versus oral B12.  Patient would like to try B12 injections.  First injection will be today.  PLAN SUMMARY: >> Recommend 2 doses of IV Feraheme.  >> Recommend monthly B12 injections starting today. >> Return to clinic in 4 months with labs a few days before and virtual visit.     All questions were answered. The patient knows to call the clinic with any problems, questions or concerns.  I have spent a total of 30 minutes minutes of face-to-face and non-face-to-face time, preparing to see the patient, performing a medically appropriate examination, counseling and educating the patient, ordering medications/tests/procedures,documenting clinical information in the electronic health record, and care coordination.    Durenda Hurt, NP 09/26/2023 6:03 PM

## 2023-09-23 NOTE — Progress Notes (Signed)
Penny [name] presents today for injection per the provider's orders.  Vitamin B12 injection administration without incident; injection site WNL; see MAR for injection details.  Patient tolerated procedure well and without incident.  No questions or complaints noted at this time. Patient discharged ambulatory in stable condition.

## 2023-09-23 NOTE — Patient Instructions (Signed)
[facility]  Discharge Instructions: Thank you for choosing [facility] to provide your oncology and hematology care.  If you have a lab appointment with the Cancer Center - please note that after April 8th, 2024, all labs will be drawn in the cancer center.  You do not have to check in or register with the main entrance as you have in the past but will complete your check-in in the cancer center.  Wear comfortable clothing and clothing appropriate for easy access to any Portacath or PICC line.   We strive to give you quality time with your provider. You may need to reschedule your appointment if you arrive late (15 or more minutes).  Arriving late affects you and other patients whose appointments are after yours.  Also, if you miss three or more appointments without notifying the office, you may be dismissed from the clinic at the provider's discretion.      For prescription refill requests, have your pharmacy contact our office and allow 72 hours for refills to be completed.    Today you received the following Vitamin B 12 injection  Vitamin B12 Injection What is this medication? Vitamin B12 (VAHY tuh min B12) prevents and treats low vitamin B12 levels in your body. It is used in people who do not get enough vitamin B12 from their diet or when their digestive tract does not absorb enough. Vitamin B12 plays an important role in maintaining the health of your nervous system and red blood cells. This medicine may be used for other purposes; ask your health care provider or pharmacist if you have questions. COMMON BRAND NAME(S): B-12 Compliance Kit, B-12 Injection Kit, Cyomin, Dodex, LA-12, Nutri-Twelve, Physicians EZ Use B-12, Primabalt, Vitamin Deficiency Injectable System - B12 What should I tell my care team before I take this medication? They need to know if you have any of these conditions: Kidney disease Leber's disease Megaloblastic anemia An unusual or  allergic reaction to cyanocobalamin, cobalt, other medications, foods, dyes, or preservatives Pregnant or trying to get pregnant Breast-feeding How should I use this medication? This medication is injected into a muscle or deeply under the skin. It is usually given in a clinic or care team's office. However, your care team may teach you how to inject yourself. Follow all instructions. Talk to your care team about the use of this medication in children. Special care may be needed. Overdosage: If you think you have taken too much of this medicine contact a poison control center or emergency room at once. NOTE: This medicine is only for you. Do not share this medicine with others. What if I miss a dose? If you are given your dose at a clinic or care team's office, call to reschedule your appointment. If you give your own injections, and you miss a dose, take it as soon as you can. If it is almost time for your next dose, take only that dose. Do not take double or extra doses. What may interact with this medication? Alcohol Colchicine This list may not describe all possible interactions. Give your health care provider a list of all the medicines, herbs, non-prescription drugs, or dietary supplements you use. Also tell them if you smoke, drink alcohol, or use illegal drugs. Some items may interact with your medicine. What should I watch for while using this medication? Visit your care team regularly. You may need blood work done while you are taking this medication. You may need to follow  a special diet. Talk to your care team. Limit your alcohol intake and avoid smoking to get the best benefit. What side effects may I notice from receiving this medication? Side effects that you should report to your care team as soon as possible: Allergic reactions--skin rash, itching, hives, swelling of the face, lips, tongue, or throat Swelling of the ankles, hands, or feet Trouble breathing Side effects that  usually do not require medical attention (report to your care team if they continue or are bothersome): Diarrhea This list may not describe all possible side effects. Call your doctor for medical advice about side effects. You may report side effects to FDA at 1-800-FDA-1088. Where should I keep my medication? Keep out of the reach of children. Store at room temperature between 15 and 30 degrees C (59 and 85 degrees F). Protect from light. Throw away any unused medication after the expiration date. NOTE: This sheet is a summary. It may not cover all possible information. If you have questions about this medicine, talk to your doctor, pharmacist, or health care provider.  2024 Elsevier/Gold Standard (2021-08-04 00:00:00)     To help prevent nausea and vomiting after your treatment, we encourage you to take your nausea medication as directed.  BELOW ARE SYMPTOMS THAT SHOULD BE REPORTED IMMEDIATELY: *FEVER GREATER THAN 100.4 F (38 C) OR HIGHER *CHILLS OR SWEATING *NAUSEA AND VOMITING THAT IS NOT CONTROLLED WITH YOUR NAUSEA MEDICATION *UNUSUAL SHORTNESS OF BREATH *UNUSUAL BRUISING OR BLEEDING *URINARY PROBLEMS (pain or burning when urinating, or frequent urination) *BOWEL PROBLEMS (unusual diarrhea, constipation, pain near the anus) TENDERNESS IN MOUTH AND THROAT WITH OR WITHOUT PRESENCE OF ULCERS (sore throat, sores in mouth, or a toothache) UNUSUAL RASH, SWELLING OR PAIN  UNUSUAL VAGINAL DISCHARGE OR ITCHING   Items with * indicate a potential emergency and should be followed up as soon as possible or go to the Emergency Department if any problems should occur.  Please show the CHEMOTHERAPY ALERT CARD or IMMUNOTHERAPY ALERT CARD at check-in to the Emergency Department and triage nurse.  Should you have questions after your visit or need to cancel or reschedule your appointment, please contact [facility] [phone]  and follow the prompts.  Office hours are 8:00  a.m. to 4:30 p.m. Monday - Friday. Please note that voicemails left after 4:00 p.m. may not be returned until the following business day.  We are closed weekends and major holidays. You have access to a nurse at all times for urgent questions. Please call the main number to the clinic [phone] and follow the prompts.  For any non-urgent questions, you may also contact your provider using MyChart. We now offer e-Visits for anyone 61 and older to request care online for non-urgent symptoms. For details visit [email or web address].   Also download the MyChart app! Go to the app store, search "MyChart", open the app, select [place], and log in with your MyChart username and password.

## 2023-09-26 ENCOUNTER — Encounter: Payer: Self-pay | Admitting: Physician Assistant

## 2023-09-30 ENCOUNTER — Inpatient Hospital Stay: Payer: MEDICAID

## 2023-10-04 ENCOUNTER — Other Ambulatory Visit: Payer: Self-pay | Admitting: Family Medicine

## 2023-10-04 DIAGNOSIS — G8929 Other chronic pain: Secondary | ICD-10-CM

## 2023-10-07 ENCOUNTER — Inpatient Hospital Stay: Payer: MEDICAID

## 2023-10-07 ENCOUNTER — Inpatient Hospital Stay: Payer: MEDICAID | Attending: Physician Assistant

## 2023-10-07 VITALS — BP 118/75 | HR 98 | Temp 96.0°F | Resp 18

## 2023-10-07 DIAGNOSIS — K909 Intestinal malabsorption, unspecified: Secondary | ICD-10-CM | POA: Diagnosis present

## 2023-10-07 DIAGNOSIS — D508 Other iron deficiency anemias: Secondary | ICD-10-CM | POA: Diagnosis present

## 2023-10-07 DIAGNOSIS — D500 Iron deficiency anemia secondary to blood loss (chronic): Secondary | ICD-10-CM

## 2023-10-07 MED ORDER — SODIUM CHLORIDE 0.9% FLUSH: 10.0000 mL | Freq: Two times a day (BID) | INTRAVENOUS | Status: DC

## 2023-10-07 MED ORDER — PROCHLORPERAZINE EDISYLATE 10 MG/2ML IJ SOLN
10.0000 mg | Freq: Once | INTRAMUSCULAR | Status: AC
  Administered 2023-10-07: 10 mg via INTRAVENOUS
  Filled 2023-10-07: qty 2

## 2023-10-07 MED ORDER — METHYLPREDNISOLONE SODIUM SUCC 125 MG IJ SOLR
125.0000 mg | Freq: Once | INTRAMUSCULAR | Status: AC
  Administered 2023-10-07: 125 mg via INTRAVENOUS
  Filled 2023-10-07: qty 2

## 2023-10-07 MED ORDER — SODIUM CHLORIDE 0.9 % IV SOLN
510.0000 mg | Freq: Once | INTRAVENOUS | Status: AC
  Administered 2023-10-07: 510 mg via INTRAVENOUS
  Filled 2023-10-07: qty 510

## 2023-10-07 MED ORDER — SODIUM CHLORIDE 0.9 % IV SOLN: INTRAVENOUS | Status: DC

## 2023-10-07 NOTE — Progress Notes (Signed)
Patient presents today for iron infusion.  Patient is in satisfactory condition with no new complaints voiced.  Vital signs are stable.  We will proceed with infusion per provider orders.    Pt took Zyrtec at home prior to arrival. Peripheral IV started with good blood return pre and post infusion.  Feraheme 510 mg given today per MD orders. Tolerated infusion without adverse affects. Vital signs stable. No complaints at this time. Discharged from clinic ambulatory in stable condition. Alert and oriented x 3. F/U with [facility] as scheduled.

## 2023-10-07 NOTE — Patient Instructions (Signed)
[facility] [place]  Discharge Instructions: Thank you for choosing [facility] to provide your oncology and hematology care.  If you have a lab appointment with the Cancer Center - please note that after April 8th, 2024, all labs will be drawn in the cancer center.  You do not have to check in or register with the main entrance as you have in the past but will complete your check-in in the cancer center.  Wear comfortable clothing and clothing appropriate for easy access to any Portacath or PICC line.   We strive to give you quality time with your provider. You may need to reschedule your appointment if you arrive late (15 or more minutes).  Arriving late affects you and other patients whose appointments are after yours.  Also, if you miss three or more appointments without notifying the office, you may be dismissed from the clinic at the provider's discretion.      For prescription refill requests, have your pharmacy contact our office and allow 72 hours for refills to be completed.    Today you received Feraheme IV iron infusion.   .  BELOW ARE SYMPTOMS THAT SHOULD BE REPORTED IMMEDIATELY: *FEVER GREATER THAN 100.4 F (38 C) OR HIGHER *CHILLS OR SWEATING *NAUSEA AND VOMITING THAT IS NOT CONTROLLED WITH YOUR NAUSEA MEDICATION *UNUSUAL SHORTNESS OF BREATH *UNUSUAL BRUISING OR BLEEDING *URINARY PROBLEMS (pain or burning when urinating, or frequent urination) *BOWEL PROBLEMS (unusual diarrhea, constipation, pain near the anus) TENDERNESS IN MOUTH AND THROAT WITH OR WITHOUT PRESENCE OF ULCERS (sore throat, sores in mouth, or a toothache) UNUSUAL RASH, SWELLING OR PAIN  UNUSUAL VAGINAL DISCHARGE OR ITCHING   Items with * indicate a potential emergency and should be followed up as soon as possible or go to the Emergency Department if any problems should occur.  Please show the CHEMOTHERAPY ALERT CARD or IMMUNOTHERAPY ALERT CARD at check-in to the Emergency  Department and triage nurse.  Should you have questions after your visit or need to cancel or reschedule your appointment, please contact [facility] [place] [phone]  and follow the prompts.  Office hours are 8:00 a.m. to 4:30 p.m. Monday - Friday. Please note that voicemails left after 4:00 p.m. may not be returned until the following business day.  We are closed weekends and major holidays. You have access to a nurse at all times for urgent questions. Please call the main number to the clinic [phone] and follow the prompts.  For any non-urgent questions, you may also contact your provider using MyChart. We now offer e-Visits for anyone 18 and older to request care online for non-urgent symptoms. For details visit [email or web address].   Also download the MyChart app! Go to the app store, search "MyChart", open the app, select [place], and log in with your MyChart username and password.   

## 2023-10-12 ENCOUNTER — Other Ambulatory Visit (HOSPITAL_COMMUNITY): Payer: Self-pay

## 2023-10-12 ENCOUNTER — Encounter: Payer: Self-pay | Admitting: Physician Assistant

## 2023-10-20 MED FILL — Ferumoxytol Inj 510 MG/17ML (30 MG/ML) (Elemental Fe): INTRAVENOUS | Qty: 17 | Status: AC

## 2023-10-21 ENCOUNTER — Inpatient Hospital Stay: Payer: MEDICAID

## 2023-10-21 ENCOUNTER — Ambulatory Visit: Payer: MEDICAID

## 2023-10-21 ENCOUNTER — Other Ambulatory Visit: Payer: Self-pay | Admitting: Neurology

## 2023-10-22 ENCOUNTER — Telehealth: Payer: MEDICAID | Admitting: Family Medicine

## 2023-10-22 DIAGNOSIS — R3989 Other symptoms and signs involving the genitourinary system: Secondary | ICD-10-CM

## 2023-10-22 DIAGNOSIS — N760 Acute vaginitis: Secondary | ICD-10-CM

## 2023-10-22 MED ORDER — FLUCONAZOLE 150 MG PO TABS: ORAL_TABLET | ORAL | 0 refills | Status: DC

## 2023-10-22 MED ORDER — NITROFURANTOIN MONOHYD MACRO 100 MG PO CAPS: 100.0000 mg | ORAL_CAPSULE | Freq: Two times a day (BID) | ORAL | 0 refills | Status: AC

## 2023-10-22 NOTE — Patient Instructions (Signed)
Irving Burton Esters, thank you for joining Reed Pandy, PA-C for today's virtual visit.  While this provider is not your primary care provider (PCP), if your PCP is located in our provider database this encounter information will be shared with them immediately following your visit.   A [facility] MyChart account gives you access to today's visit and all your visits, tests, and labs performed at [facility] " click here if you don't have a [facility] MyChart account or go to [email or web address]  Consent: (Patient) Penny [name] provided verbal consent for this virtual visit at the beginning of the encounter.  Current Medications:  Current Outpatient Medications:    fluconazole (DIFLUCAN) 150 MG tablet, Take one tablet today, if symptoms persist after 72 hours, may take second tablet., Disp: 2 tablet, Rfl: 0   nitrofurantoin, macrocrystal-monohydrate, (MACROBID) 100 MG capsule, Take 1 capsule (100 mg total) by mouth 2 (two) times daily for 5 days., Disp: 10 capsule, Rfl: 0   ADDERALL XR 30 MG 24 hr capsule, TAKE 1 CAPSULE BY MOUTH ONCE EVERY MORNING., Disp: 30 capsule, Rfl: 0   azelastine (ASTELIN) 0.1 % nasal spray, spray 2 spray by intranasal route 2 times every day in each nostril, Disp: , Rfl:    budesonide-formoterol (SYMBICORT) 160-4.5 MCG/ACT inhaler, Inhale into the lungs., Disp: , Rfl:    Calcium Carb-Cholecalciferol (CALCIUM-VITAMIN D3) 600-500 MG-UNIT CAPS, Take 1 tablet by mouth daily., Disp: , Rfl:    cetirizine (ZYRTEC) 10 MG tablet, TAKE 1 TABLET BY MOUTH ONCE DAILY, Disp: 30 tablet, Rfl: 0   clonazePAM (KLONOPIN) 0.5 MG tablet, TAKE 1 TABLET BY MOUTH THREE TIMES DAILY AS NEEDED., Disp: 90 tablet, Rfl: 0   cloNIDine (CATAPRES) 0.1 MG tablet, Take 0.1 mg by mouth at bedtime., Disp: , Rfl:    cyclobenzaprine (FLEXERIL) 10 MG tablet, TAKE 1 TABLET BY MOUTH TWICE DAILY AS NEEDED., Disp: 60 tablet, Rfl: 3   DULoxetine (CYMBALTA) 60 MG capsule, Take 1 capsule (60 mg total)  by mouth 2 (two) times daily., Disp: 180 capsule, Rfl: 2   Erenumab-aooe (AIMOVIG) 140 MG/ML SOAJ, INJECT 1 PEN INTO THE SKIN ONCE EVERY 28 DAYS., Disp: 1 mL, Rfl: 11   levalbuterol (XOPENEX HFA) 45 MCG/ACT inhaler, INHALE (2) PUFFS INTO THE LUNGS EVERY SIX HOURS AS NEEDED FOR WHEEZING., Disp: 15 g, Rfl: 3   ondansetron (ZOFRAN-ODT) 8 MG disintegrating tablet, Take 8 mg by mouth every 8 (eight) hours as needed., Disp: , Rfl:    pantoprazole (PROTONIX) 40 MG tablet, TAKE ONE TABLET BY MOUTH ONCE DAILY., Disp: 30 tablet, Rfl: 0   propranolol (INDERAL) 10 MG tablet, Take 10 mg by mouth 3 (three) times daily., Disp: , Rfl:    Rimegepant Sulfate (NURTEC) 75 MG TBDP, TAKE 1 TABLET BY MOUTH AS NEEDED. MAX TWICE WEEKLY., Disp: 8 tablet, Rfl: 11   valACYclovir (VALTREX) 1000 MG tablet, TAKE 1 TABLET BY MOUTH DAILY., Disp: 30 tablet, Rfl: PRN   Vitamin D, Ergocalciferol, (DRISDOL) 1.25 MG (50000 UNIT) CAPS capsule, Take 50,000 Units by mouth every Wednesday., Disp: , Rfl:    Medications ordered in this encounter:  Meds ordered this encounter  Medications   nitrofurantoin, macrocrystal-monohydrate, (MACROBID) 100 MG capsule    Sig: Take 1 capsule (100 mg total) by mouth 2 (two) times daily for 5 days.    Dispense:  10 capsule    Refill:  0   fluconazole (DIFLUCAN) 150 MG tablet    Sig: Take one tablet today, if symptoms persist after 72 hours,  may take second tablet.    Dispense:  2 tablet    Refill:  0     *If you need refills on other medications prior to your next appointment, please contact your pharmacy*  Follow-Up: Call back or seek an in-person evaluation if the symptoms worsen or if the condition fails to improve as anticipated.  [facility] [phone]  Other Instructions Urinary Tract Infection, Adult A urinary tract infection (UTI) is an infection of any part of the urinary tract. The urinary tract includes: The kidneys. The ureters. The bladder. The  urethra. These organs make, store, and get rid of pee (urine) in the body. What are the causes? This infection is caused by germs (bacteria) in your genital area. These germs grow and cause swelling (inflammation) of your urinary tract. What increases the risk? The following factors may make you more likely to develop this condition: Using a small, thin tube (catheter) to drain pee. Not being able to control when you pee or poop (incontinence). Being female. If you are female, these things can increase the risk: Using these methods to prevent pregnancy: A medicine that kills sperm (spermicide). A device that blocks sperm (diaphragm). Having low levels of a female hormone (estrogen). Being pregnant. You are more likely to develop this condition if: You have genes that add to your risk. You are sexually active. You take antibiotic medicines. You have trouble peeing because of: A prostate that is bigger than normal, if you are female. A blockage in the part of your body that drains pee from the bladder. A kidney stone. A nerve condition that affects your bladder. Not getting enough to drink. Not peeing often enough. You have other conditions, such as: Diabetes. A weak disease-fighting system (immune system). Sickle cell disease. Gout. Injury of the spine. What are the signs or symptoms? Symptoms of this condition include: Needing to pee right away. Peeing small amounts often. Pain or burning when peeing. Blood in the pee. Pee that smells bad or not like normal. Trouble peeing. Pee that is cloudy. Fluid coming from the vagina, if you are female. Pain in the belly or lower back. Other symptoms include: Vomiting. Not feeling hungry. Feeling mixed up (confused). This may be the first symptom in older adults. Being tired and grouchy (irritable). A fever. Watery poop (diarrhea). How is this treated? Taking antibiotic medicine. Taking other medicines. Drinking enough  water. In some cases, you may need to see a specialist. Follow these instructions at home:  Medicines Take over-the-counter and prescription medicines only as told by your doctor. If you were prescribed an antibiotic medicine, take it as told by your doctor. Do not stop taking it even if you start to feel better. General instructions Make sure you: Pee until your bladder is empty. Do not hold pee for a long time. Empty your bladder after sex. Wipe from front to back after peeing or pooping if you are a female. Use each tissue one time when you wipe. Drink enough fluid to keep your pee pale yellow. Keep all follow-up visits. Contact a doctor if: You do not get better after 1-2 days. Your symptoms go away and then come back. Get help right away if: You have very bad back pain. You have very bad pain in your lower belly. You have a fever. You have chills. You feeling like you will vomit or you vomit. Summary A urinary tract infection (UTI) is an infection of any part of the urinary tract.  This condition is caused by germs in your genital area. There are many risk factors for a UTI. Treatment includes antibiotic medicines. Drink enough fluid to keep your pee pale yellow. This information is not intended to replace advice given to you by your health care provider. Make sure you discuss any questions you have with your health care provider. Document Revised: 06/29/2020 Document Reviewed: 07/04/2020 Elsevier Patient Education  2024 Elsevier Inc. Vaginal Yeast Infection, Adult  Vaginal yeast infection is a condition that causes vaginal discharge as well as soreness, swelling, and redness (inflammation) of the vagina. This is a common condition. Some women get this infection frequently. What are the causes? This condition is caused by a change in the normal balance of the yeast (Candida) and normal bacteria that live in the vagina. This change causes an overgrowth of yeast, which causes  the inflammation. What increases the risk? The condition is more likely to develop in women who: Take antibiotic medicines. Have diabetes. Take birth control pills. Are pregnant. Douche often. Have a weak body defense system (immune system). Have been taking steroid medicines for a long time. Frequently wear tight clothing. What are the signs or symptoms? Symptoms of this condition include: White, thick, creamy vaginal discharge. Swelling, itching, redness, and irritation of the vagina. The lips of the vagina (labia) may be affected as well. Pain or a burning feeling while urinating. Pain during sex. How is this diagnosed? This condition is diagnosed based on: Your medical history. A physical exam. A pelvic exam. Your health care provider will examine a sample of your vaginal discharge under a microscope. Your health care provider may send this sample for testing to confirm the diagnosis. How is this treated? This condition is treated with medicine. Medicines may be over-the-counter or prescription. You may be told to use one or more of the following: Medicine that is taken by mouth (orally). Medicine that is applied as a cream (topically). Medicine that is inserted directly into the vagina (suppository). Follow these instructions at home: Take or apply over-the-counter and prescription medicines only as told by your health care provider. Do not use tampons until your health care provider approves. Do not have sex until your infection has cleared. Sex can prolong or worsen your symptoms of infection. Ask your health care provider when it is safe to resume sexual activity. Keep all follow-up visits. This is important. How is this prevented?  Do not wear tight clothes, such as pantyhose or tight pants. Wear breathable cotton underwear. Do not use douches, perfumed soap, creams, or powders. Wipe from front to back after using the toilet. If you have diabetes, keep your blood sugar  levels under control. Ask your health care provider for other ways to prevent yeast infections. Contact a health care provider if: You have a fever. Your symptoms go away and then return. Your symptoms do not get better with treatment. Your symptoms get worse. You have new symptoms. You develop blisters in or around your vagina. You have blood coming from your vagina and it is not your menstrual period. You develop pain in your abdomen. Summary Vaginal yeast infection is a condition that causes discharge as well as soreness, swelling, and redness (inflammation) of the vagina. This condition is treated with medicine. Medicines may be over-the-counter or prescription. Take or apply over-the-counter and prescription medicines only as told by your health care provider. Do not douche. Resume sexual activity or use of tampons as instructed by your health care provider. Contact a  health care provider if your symptoms do not get better with treatment or your symptoms go away and then return. This information is not intended to replace advice given to you by your health care provider. Make sure you discuss any questions you have with your health care provider. Document Revised: 02/09/2021 Document Reviewed: 02/09/2021 Elsevier Patient Education  2024 Elsevier Inc.    If you have been instructed to have an in-person evaluation today at a local Urgent Care facility, please use the link below. It will take you to a list of all of our available [facility], including address, phone number and hours of operation. Please do not delay care.  [facility]  If you or a family member do not have a primary care provider, use the link below to schedule a visit and establish care. When you choose a [facility] primary care physician or advanced practice provider, you gain a long-term partner in health. Find a Primary Care Provider  Learn more about [facility]'s in-office and virtual  care options: [facility] - Get Care Now

## 2023-10-22 NOTE — Progress Notes (Signed)
Virtual Visit Consent   Penny [name], you are scheduled for a virtual visit with a [facility] provider today. Just as with appointments in the office, your consent must be obtained to participate. Your consent will be active for this visit and any virtual visit you may have with one of our providers in the next 365 days. If you have a MyChart account, a copy of this consent can be sent to you electronically.  As this is a virtual visit, video technology does not allow for your provider to perform a traditional examination. This may limit your provider's ability to fully assess your condition. If your provider identifies any concerns that need to be evaluated in person or the need to arrange testing (such as labs, EKG, etc.), we will make arrangements to do so. Although advances in technology are sophisticated, we cannot ensure that it will always work on either your end or our end. If the connection with a video visit is poor, the visit may have to be switched to a telephone visit. With either a video or telephone visit, we are not always able to ensure that we have a secure connection.  By engaging in this virtual visit, you consent to the provision of healthcare and authorize for your insurance to be billed (if applicable) for the services provided during this visit. Depending on your insurance coverage, you may receive a charge related to this service.  I need to obtain your verbal consent now. Are you willing to proceed with your visit today? Penny [name] has provided verbal consent on 10/22/2023 for a virtual visit (video or telephone). Reed Pandy, [place]  Date: 10/22/2023 11:28 AM  Virtual Visit via Video Note   I, Reed Pandy, connected with  Penny [name]  ([id number], 1986-03-19) on 10/22/23 at 11:15 AM EST by a video-enabled telemedicine application and verified that I am speaking with the correct person using two identifiers.  Location: Patient: Virtual Visit Location Patient:  Home Provider: Virtual Visit Location Provider: Home Office   I discussed the limitations of evaluation and management by telemedicine and the availability of in person appointments. The patient expressed understanding and agreed to proceed.    History of Present Illness: Penny [name] is a 37 y.o. who identifies as a female who was assigned female at birth, and is being seen today for c/o Pt states she has both a UTI and a yeast infection.  Pt states she is taking antibiotics.  Pt states she is having burning with urination.  Pt does have chills.  Pt denies N/V.  Pt is currently taking Clindamycin.  Pt states taking them on and off since the summer.  Pt states she is taking the medication for a tooth infection. Pt denies blood in urine, but has noticed some cloudiness and odor to her urine. Pt states she has vaginal itching.   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   Absence of bladder continence 06/14/2022   Joint pain 04/01/2022   Chronic prescription opiate use 12/25/2020   Major depressive disorder, recurrent episode (HCC) 06/22/2020   Chronic abdominal pain 09/19/2019   Gastroesophageal reflux disease without esophagitis    Severe anxiety 08/21/2019   Urge incontinence 10/26/2018   S/P kyphoplasty 08/02/2018   Herpes genitalis in women  12/19/2017   Urinary tract infection with hematuria 03/30/2017   Family history of breast cancer in mother,uncertain BR CA status 02/28/2017   Chronic pain syndrome 12/05/2016   Folate deficiency 12/04/2016   Anastomotic ulcer 04/22/2016   Pain medication agreement signed 04/22/2016   Neuropathy 01/23/2016   Major depressive disorder, recurrent episode, 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HCC) 05/10/2015   Fibromyalgia  02/18/2015   Vulvar fissure 02/18/2015   Current smoker 01/29/2015   Current tobacco use 01/29/2015   Sciatica of left side 11/14/2014   Psychogenic nonepileptic seizure 09/12/2014   Hypertension in pregnancy, transient 07/08/2014   Previous gastric bypass affecting pregnancy, antepartum 05/22/2014   History of bariatric surgery 05/22/2014   Patent foramen ovale with right to left shunt 05/17/2014   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   Rapid palpitations 02/18/2014   Supervision of pregnancy with other poor reproductive or obstetric history, unspecified trimester 02/18/2014   Restless legs 01/16/2014   Restless leg 01/16/2014   Chronic interstitial cystitis 10/23/2013   Depression with anxiety 03/10/2012    Class: Chronic   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 03/10/2012    Class: Chronic   Attention deficit hyperactivity disorder, 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    Class: Chronic   Panic disorder without agoraphobia 03/10/2012   H/O disease 03/10/2012   Dysthymia 03/10/2012   Conversion disorder with seizures or convulsions 03/10/2012   Other specified behavioral and emotional disorders with onset usually occurring in childhood and adolescence 03/10/2012   Pelvic congestion syndrome 10/13/2011   Chronic migraine without aura 10/13/2011   Other specified conditions associated with female genital organs and menstrual cycle 10/13/2011   IBS (irritable bowel syndrome) 08/25/2011   Iron deficiency anemia 07/23/2010    Allergies:  Allergies  Allergen Reactions   Gabapentin Other (See Comments)    Pt states that she was unresponsive after taking this medication, but her vitals remained stable.     Metoclopramide Hcl Anxiety and Other (See Comments)    Pt states that she felt like she was trapped in a box, and could not get out.  Pt also states that she had temporary loss of movement, weakness, and tingling.     Tramadol Other (See Comments)     Seizures   Ativan [Lorazepam] Other (See Comments)    combative   Latex Itching, Rash and Other (See Comments)    Burns   Lyrica [Pregabalin] Other (See Comments)    suicidal   Iron Other (See Comments)    PATIENT IS INTOLERANT TO ORAL IRON , SHE DOES NOT ABSORB IT.     Sulfa Antibiotics Hives   Butrans [Buprenorphine] Rash    Patch caused rash, didn't help with pain   Nucynta [Tapentadol] Nausea And Vomiting   Propofol Nausea And Vomiting   Tape Hives, Itching and Rash    Please use paper tape   Toradol [Ketorolac Tromethamine] Anxiety   Medications:  Current Outpatient Medications:    fluconazole (DIFLUCAN) 150 MG tablet, Take one tablet today, if symptoms persist after 72 hours, may take second tablet., Disp: 2 tablet, Rfl: 0   nitrofurantoin, macrocrystal-monohydrate, (MACROBID) 100 MG capsule, Take 1 capsule (100 mg total) by mouth 2 (two) times daily for 5 days., Disp: 10 capsule, Rfl: 0   ADDERALL XR 30 MG 24 hr capsule, TAKE 1 CAPSULE BY MOUTH ONCE EVERY MORNING., Disp: 30 capsule, Rfl: 0   azelastine (ASTELIN) 0.1 % nasal spray, spray 2 spray by intranasal route 2 times every day  in each nostril, Disp: , Rfl:    budesonide-formoterol (SYMBICORT) 160-4.5 MCG/ACT inhaler, Inhale into the lungs., Disp: , Rfl:    Calcium Carb-Cholecalciferol (CALCIUM-VITAMIN D3) 600-500 MG-UNIT CAPS, Take 1 tablet by mouth daily., Disp: , Rfl:    cetirizine (ZYRTEC) 10 MG tablet, TAKE 1 TABLET BY MOUTH ONCE DAILY, Disp: 30 tablet, Rfl: 0   clonazePAM (KLONOPIN) 0.5 MG tablet, TAKE 1 TABLET BY MOUTH THREE TIMES DAILY AS NEEDED., Disp: 90 tablet, Rfl: 0   cloNIDine (CATAPRES) 0.1 MG tablet, Take 0.1 mg by mouth at bedtime., Disp: , Rfl:    cyclobenzaprine (FLEXERIL) 10 MG tablet, TAKE 1 TABLET BY MOUTH TWICE DAILY AS NEEDED., Disp: 60 tablet, Rfl: 3   DULoxetine (CYMBALTA) 60 MG capsule, Take 1 capsule (60 mg total) by mouth 2 (two) times daily., Disp: 180 capsule, Rfl: 2   Erenumab-aooe (AIMOVIG)  140 MG/ML SOAJ, INJECT 1 PEN INTO THE SKIN ONCE EVERY 28 DAYS., Disp: 1 mL, Rfl: 11   levalbuterol (XOPENEX HFA) 45 MCG/ACT inhaler, INHALE (2) PUFFS INTO THE LUNGS EVERY SIX HOURS AS NEEDED FOR WHEEZING., Disp: 15 g, Rfl: 3   ondansetron (ZOFRAN-ODT) 8 MG disintegrating tablet, Take 8 mg by mouth every 8 (eight) hours as needed., Disp: , Rfl:    pantoprazole (PROTONIX) 40 MG tablet, TAKE ONE TABLET BY MOUTH ONCE DAILY., Disp: 30 tablet, Rfl: 0   propranolol (INDERAL) 10 MG tablet, Take 10 mg by mouth 3 (three) times daily., Disp: , Rfl:    Rimegepant Sulfate (NURTEC) 75 MG TBDP, TAKE 1 TABLET BY MOUTH AS NEEDED. MAX TWICE WEEKLY., Disp: 8 tablet, Rfl: 11   valACYclovir (VALTREX) 1000 MG tablet, TAKE 1 TABLET BY MOUTH DAILY., Disp: 30 tablet, Rfl: PRN   Vitamin D, Ergocalciferol, (DRISDOL) 1.25 MG (50000 UNIT) CAPS capsule, Take 50,000 Units by mouth every Wednesday., Disp: , Rfl:   Observations/Objective: Patient is well-developed, well-nourished in no acute distress.  Resting comfortably at home.  Head is normocephalic, atraumatic.  No labored breathing.  Speech is clear and coherent with logical content.  Patient is alert and oriented at baseline.    Assessment and Plan: 1. Suspected UTI - nitrofurantoin, macrocrystal-monohydrate, (MACROBID) 100 MG capsule; Take 1 capsule (100 mg total) by mouth 2 (two) times daily for 5 days.  Dispense: 10 capsule; Refill: 0  2. Acute vaginitis - fluconazole (DIFLUCAN) 150 MG tablet; Take one tablet today, if symptoms persist after 72 hours, may take second tablet.  Dispense: 2 tablet; Refill: 0  -Pt to follow up with PCP or urgent care if symptoms persist or worsen  -Pt verbalized understanding   Follow Up Instructions: I discussed the assessment and treatment plan with the patient. The patient was provided an opportunity to ask questions and all were answered. The patient agreed with the plan and demonstrated an understanding of the instructions.   A copy of instructions were sent to the patient via MyChart unless otherwise noted below.     The patient was advised to call back or seek an in-person evaluation if the symptoms worsen or if the condition fails to improve as anticipated.    Reed Pandy, PA-C

## 2023-10-25 ENCOUNTER — Other Ambulatory Visit: Payer: Self-pay | Admitting: Neurology

## 2023-11-02 ENCOUNTER — Telehealth: Payer: Self-pay | Admitting: Neurology

## 2023-11-02 ENCOUNTER — Other Ambulatory Visit: Payer: Self-pay | Admitting: Family Medicine

## 2023-11-02 NOTE — Telephone Encounter (Signed)
Pt made appt for 11-08-23 and is out of medication  she needs a refill on the aimovig  Sent to the [facility]   She is out of medication

## 2023-11-02 NOTE — Telephone Encounter (Signed)
Called patient and left a message for a call back.   Patient last visit 10/15/22  No show: 07/11/23 and 04/18/23

## 2023-11-02 NOTE — Telephone Encounter (Signed)
Pt left a VM stating that she was returning a call

## 2023-11-07 NOTE — Telephone Encounter (Addendum)
Patient schedule for follow-up with me tomorrow.  Based on her visit, I will decide about medication management.

## 2023-11-08 ENCOUNTER — Ambulatory Visit (INDEPENDENT_AMBULATORY_CARE_PROVIDER_SITE_OTHER): Payer: MEDICAID | Admitting: Neurology

## 2023-11-08 ENCOUNTER — Encounter: Payer: Self-pay | Admitting: Neurology

## 2023-11-08 VITALS — BP 119/80 | HR 103 | Ht 66.0 in | Wt 172.0 lb

## 2023-11-08 DIAGNOSIS — G43709 Chronic migraine without aura, not intractable, without status migrainosus: Secondary | ICD-10-CM

## 2023-11-08 DIAGNOSIS — G608 Other hereditary and idiopathic neuropathies: Secondary | ICD-10-CM

## 2023-11-08 MED ORDER — NURTEC 75 MG PO TBDP: ORAL_TABLET | ORAL | 11 refills | Status: DC

## 2023-11-08 MED ORDER — AIMOVIG 140 MG/ML ~~LOC~~ SOAJ: SUBCUTANEOUS | 11 refills | Status: DC

## 2023-11-08 NOTE — Progress Notes (Signed)
Follow-up Visit   Date: 11/08/23   Zaliyah Mueth MRN: [id number] DOB: 01/06/1986   Interim History: Penny [name] is a 37 y.o. 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    She is here for follow-up visits.  She has been on Aimovig 140mg  every month injections and reports that headaches are better controlled on this.  She has severe migraine about once per month, which is responsive to Nurtec.  She is requesting refills.  Medications:  Current Outpatient Medications on File Prior to Visit  Medication Sig Dispense Refill   ADDERALL XR 30 MG 24 hr capsule TAKE 1 CAPSULE BY MOUTH ONCE EVERY MORNING. 30 capsule 0   azelastine (ASTELIN) 0.1 % nasal spray spray 2 spray by intranasal route 2 times every day in each nostril     budesonide-formoterol (SYMBICORT) 160-4.5 MCG/ACT inhaler Inhale into the lungs.     Calcium Carb-Cholecalciferol (CALCIUM-VITAMIN D3) 600-500 MG-UNIT CAPS Take 1 tablet by mouth daily.     cetirizine (ZYRTEC) 10 MG tablet TAKE 1 TABLET BY MOUTH ONCE DAILY 30 tablet 0   clonazePAM (KLONOPIN) 0.5 MG tablet TAKE 1 TABLET BY MOUTH THREE TIMES DAILY AS NEEDED. 90 tablet 0   cloNIDine (CATAPRES) 0.1 MG tablet Take 0.1 mg by mouth at bedtime.     cyclobenzaprine (FLEXERIL) 10 MG tablet TAKE 1 TABLET BY MOUTH TWICE DAILY AS NEEDED. 60 tablet 3   DULoxetine (CYMBALTA) 60 MG capsule Take 1 capsule (60 mg total) by mouth 2 (two) times daily. 180 capsule 2   Erenumab-aooe (AIMOVIG) 140 MG/ML SOAJ INJECT 1 PEN INTO THE SKIN ONCE EVERY 28 DAYS. 1 mL 11   fluconazole (DIFLUCAN) 150 MG tablet Take one tablet today, if symptoms persist after 72 hours, may take second tablet. 2 tablet 0   levalbuterol (XOPENEX HFA) 45 MCG/ACT inhaler INHALE (2) PUFFS INTO THE LUNGS EVERY SIX HOURS AS NEEDED FOR WHEEZING. 15  g 3   ondansetron (ZOFRAN-ODT) 8 MG disintegrating tablet Take 8 mg by mouth every 8 (eight) hours as needed.     pantoprazole (PROTONIX) 40 MG tablet TAKE ONE TABLET BY MOUTH ONCE DAILY. 30 tablet 1   propranolol (INDERAL) 10 MG tablet Take 10 mg by mouth 3 (three) times daily.     Rimegepant Sulfate (NURTEC) 75 MG TBDP TAKE 1 TABLET BY MOUTH AS NEEDED. MAX TWICE WEEKLY. 8 tablet 11   valACYclovir (VALTREX) 1000 MG tablet TAKE 1 TABLET BY MOUTH DAILY. 30 tablet PRN   Vitamin D, Ergocalciferol, (DRISDOL) 1.25 MG (50000 UNIT) CAPS capsule Take 50,000 Units by mouth every Wednesday.     No current facility-administered medications on file prior to visit.    Allergies:  Allergies  Allergen Reactions   Gabapentin Other (See Comments)    Pt states that she was unresponsive after taking this medication, but her vitals remained stable.     Metoclopramide Hcl Anxiety and Other (See Comments)    Pt states that she felt like she was trapped in a box, and could not get out.  Pt also states that she had temporary loss of movement, weakness, and tingling.     Tramadol Other (See Comments)    Seizures   Ativan [Lorazepam] Other (See Comments)    combative   Latex Itching, Rash and Other (See Comments)    Burns   Lyrica [Pregabalin] Other (See Comments)  suicidal   Iron Other (See Comments)    PATIENT IS INTOLERANT TO ORAL IRON , SHE DOES NOT ABSORB IT.     Sulfa Antibiotics Hives   Butrans [Buprenorphine] Rash    Patch caused rash, didn't help with pain   Nucynta [Tapentadol] Nausea And Vomiting   Propofol Nausea And Vomiting   Tape Hives, Itching and Rash    Please use paper tape   Toradol [Ketorolac Tromethamine] Anxiety    Vital Signs:  BP 119/80   Pulse (!) 103   Ht 5\' 6"  (1.676 m)   Wt 172 lb (78 kg)   SpO2 100%   BMI 27.76 kg/m   Neurological Exam: MENTAL STATUS including orientation to time, place, person, recent and remote memory, attention span and concentration,  language, and fund of knowledge is normal.  Speech is not dysarthric.  CRANIAL NERVES:  Normal conjugate, extra-ocular eye movements in all directions of gaze.  No ptosis.  Face is symmetric.   MOTOR:  Motor strength is 5/5 in the arms and proximally in the legs.  No atrophy, fasciculations or abnormal movements.  No pronator drift.  Tone is normal.    MSRs:  Reflexes are 1+/4 throughout.  SENSORY:  Vibration intact throughout  COORDINATION/GAIT:  Gait is normal.  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 Mild maxillary sinus disease Otherwise unremarkable MRI brain.  MRI brain wo contrast 07/29/2021: No acute intracranial abnormality. Stable and normal noncontrast MRI appearance of the brain.  MRI lumbar spine wo contrast 07/29/2021: 1. No acute osseous injury of the lumbar spine. 2. 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 3. No evidence of cord compression.  IMPRESSION/PLAN: Chronic migraine, stable.  - Continue Aimovig 140mg  every 28 days - Continue Nurtec 75mg  for severe migraine  2.  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  Return to clinic in 1 year  Thank you for allowing me to participate in patient's care.  If I can answer any additional questions, I would be pleased to do so.    Sincerely,    Summar Mcglothlin K. Allena Katz, DO

## 2023-11-16 ENCOUNTER — Ambulatory Visit: Payer: MEDICAID | Admitting: Family Medicine

## 2023-11-16 VITALS — BP 108/77 | Ht 66.0 in | Wt 171.0 lb

## 2023-11-16 DIAGNOSIS — R1319 Other dysphagia: Secondary | ICD-10-CM | POA: Diagnosis not present

## 2023-11-16 DIAGNOSIS — M62830 Muscle spasm of back: Secondary | ICD-10-CM

## 2023-11-16 MED ORDER — SUCRALFATE 1 G PO TABS: ORAL_TABLET | ORAL | 0 refills | Status: AC

## 2023-11-16 MED ORDER — TIZANIDINE HCL 2 MG PO CAPS: 2.0000 mg | ORAL_CAPSULE | Freq: Three times a day (TID) | ORAL | 1 refills | Status: DC
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
## 2023-11-17 ENCOUNTER — Other Ambulatory Visit: Payer: Self-pay

## 2023-11-17 DIAGNOSIS — R1319 Other dysphagia: Secondary | ICD-10-CM

## 2023-11-18 ENCOUNTER — Inpatient Hospital Stay: Payer: MEDICAID

## 2023-11-21 ENCOUNTER — Encounter: Payer: Self-pay | Admitting: Neurology

## 2023-11-23 ENCOUNTER — Telehealth: Payer: Self-pay | Admitting: Pharmacy Technician

## 2023-11-23 ENCOUNTER — Telehealth: Payer: Self-pay | Admitting: Neurology

## 2023-11-23 NOTE — Telephone Encounter (Signed)
Pharmacy Patient Advocate Encounter   Received notification from Patient Advice Request messages that prior authorization for NURTEC 75MG  is required/requested.   Insurance verification completed.   The patient is insured through [employer] .   Per test claim: PA required; PA submitted to above mentioned insurance via CoverMyMeds Key/confirmation #/EOC [id number] Status is pending

## 2023-11-23 NOTE — Telephone Encounter (Signed)
Pt wants to speak with someone with about Aimovig  prior auth

## 2023-11-23 NOTE — Telephone Encounter (Signed)
Pharmacy Patient Advocate Encounter   Received notification from Patient Advice Request messages that prior authorization for AIMOVIG 140MG  is required/requested.   Insurance verification completed.   The patient is insured through [employer] .   Per test claim: PA required; PA submitted to above mentioned insurance via CoverMyMeds Key/confirmation #/EOC [id number] Status is pending

## 2023-11-23 NOTE — Telephone Encounter (Signed)
PA has been submitted, and telephone encounter has been created. 

## 2023-11-24 NOTE — Telephone Encounter (Signed)
Pharmacy Patient Advocate Encounter  Received notification from [facility] that Prior Authorization for Aimovig 140mg  has been APPROVED from 11/23/2023 to 11/22/2024

## 2023-11-24 NOTE — Telephone Encounter (Signed)
Pharmacy Patient Advocate Encounter  Received notification from [facility] that Prior Authorization for Nurtec 75mg  has been APPROVED from 11/23/2023 to 11/22/2024

## 2023-12-01 ENCOUNTER — Other Ambulatory Visit: Payer: Self-pay

## 2023-12-01 DIAGNOSIS — R1319 Other dysphagia: Secondary | ICD-10-CM

## 2023-12-02 ENCOUNTER — Encounter: Payer: Self-pay | Admitting: Family Medicine

## 2023-12-16 ENCOUNTER — Inpatient Hospital Stay: Payer: MEDICAID

## 2023-12-22 MED FILL — Ferumoxytol Inj 510 MG/17ML (30 MG/ML) (Elemental Fe): INTRAVENOUS | Qty: 17 | Status: AC

## 2023-12-23 ENCOUNTER — Inpatient Hospital Stay: Payer: MEDICAID

## 2023-12-23 ENCOUNTER — Inpatient Hospital Stay: Payer: MEDICAID | Attending: Physician Assistant

## 2023-12-23 VITALS — BP 124/82 | HR 82 | Temp 97.2°F | Resp 18

## 2023-12-23 DIAGNOSIS — D508 Other iron deficiency anemias: Secondary | ICD-10-CM | POA: Insufficient documentation

## 2023-12-23 DIAGNOSIS — K2970 Gastritis, unspecified, without bleeding: Secondary | ICD-10-CM | POA: Insufficient documentation

## 2023-12-23 DIAGNOSIS — K909 Intestinal malabsorption, unspecified: Secondary | ICD-10-CM | POA: Insufficient documentation

## 2023-12-23 DIAGNOSIS — D500 Iron deficiency anemia secondary to blood loss (chronic): Secondary | ICD-10-CM

## 2023-12-23 MED ORDER — CETIRIZINE HCL 10 MG PO TABS
10.0000 mg | ORAL_TABLET | Freq: Once | ORAL | Status: DC
Start: 2023-12-23 — End: 2023-12-23

## 2023-12-23 MED ORDER — METHYLPREDNISOLONE SODIUM SUCC 125 MG IJ SOLR
125.0000 mg | Freq: Once | INTRAMUSCULAR | Status: AC
  Administered 2023-12-23: 125 mg via INTRAVENOUS
  Filled 2023-12-23: qty 2

## 2023-12-23 MED ORDER — OCTREOTIDE ACETATE 30 MG IM KIT
PACK | INTRAMUSCULAR | Status: AC
  Filled 2023-12-23: qty 1

## 2023-12-23 MED ORDER — SODIUM CHLORIDE 0.9 % IV SOLN
510.0000 mg | Freq: Once | INTRAVENOUS | Status: AC
  Administered 2023-12-23: 510 mg via INTRAVENOUS
  Filled 2023-12-23: qty 510

## 2023-12-23 MED ORDER — PROCHLORPERAZINE EDISYLATE 10 MG/2ML IJ SOLN
10.0000 mg | Freq: Once | INTRAMUSCULAR | Status: AC
Start: 2023-12-23 — End: 2023-12-23
  Administered 2023-12-23: 10 mg via INTRAVENOUS
  Filled 2023-12-23: qty 2

## 2023-12-23 MED ORDER — SODIUM CHLORIDE 0.9 % IV SOLN: INTRAVENOUS | Status: DC

## 2023-12-23 MED ORDER — CYANOCOBALAMIN 1000 MCG/ML IJ SOLN
1000.0000 ug | Freq: Once | INTRAMUSCULAR | Status: AC
  Administered 2023-12-23: 1000 ug via INTRAMUSCULAR
  Filled 2023-12-23: qty 1

## 2023-12-23 MED ORDER — SODIUM CHLORIDE 0.9% FLUSH: 10.0000 mL | Freq: Two times a day (BID) | INTRAVENOUS | Status: DC

## 2023-12-23 NOTE — Progress Notes (Signed)
Patient presents today for iron infusion.  Patient is in satisfactory condition with no new complaints voiced.  Vital signs are stable.  IV placed in L arm after multiple attempts from different nurses. IV flushed well with good blood return noted.  Zyrtec taken at home this morning, so we will hold that pre-medication. We will proceed with infusion per provider orders.    Patient tolerated infusion well with no complaints voiced.  Patient left ambulatory in stable condition.  Vital signs stable at discharge.  Follow up as scheduled.

## 2023-12-23 NOTE — Progress Notes (Signed)
Patient tolerated Vitamin B 12 injection with no complaints voiced.  Site clean and dry with no bruising or swelling noted.  No complaints of pain.  Discharged with vital signs stable and no signs or symptoms of distress noted.   ?

## 2023-12-23 NOTE — Patient Instructions (Signed)
[facility] [place] - A DEPT OF [facility]  Discharge Instructions: Thank you for choosing [facility] to provide your oncology and hematology care.  If you have a lab appointment with the Cancer Center - please note that after April 8th, 2024, all labs will be drawn in the cancer center.  You do not have to check in or register with the main entrance as you have in the past but will complete your check-in in the cancer center.  Wear comfortable clothing and clothing appropriate for easy access to any Portacath or PICC line.   We strive to give you quality time with your provider. You may need to reschedule your appointment if you arrive late (15 or more minutes).  Arriving late affects you and other patients whose appointments are after yours.  Also, if you miss three or more appointments without notifying the office, you may be dismissed from the clinic at the provider's discretion.      For prescription refill requests, have your pharmacy contact our office and allow 72 hours for refills to be completed.    Today you received the following:  Feraheme.  Ferumoxytol Injection What is this medication? FERUMOXYTOL (FER ue MOX i tol) treats low levels of iron in your body (iron deficiency anemia). Iron is a mineral that plays an important role in making red blood cells, which carry oxygen from your lungs to the rest of your body. This medicine may be used for other purposes; ask your health care provider or pharmacist if you have questions. COMMON BRAND NAME(S): Feraheme What should I tell my care team before I take this medication? They need to know if you have any of these conditions: Anemia not caused by low iron levels High levels of iron in the blood Magnetic resonance imaging (MRI) test scheduled An unusual or allergic reaction to iron, other medications, foods, dyes, or preservatives Pregnant or trying to get pregnant Breastfeeding How should I  use this medication? This medication is injected into a vein. It is given by your care team in a hospital or clinic setting. Talk to your care team the use of this medication in children. Special care may be needed. Overdosage: If you think you have taken too much of this medicine contact a poison control center or emergency room at once. NOTE: This medicine is only for you. Do not share this medicine with others. What if I miss a dose? It is important not to miss your dose. Call your care team if you are unable to keep an appointment. What may interact with this medication? Other iron products This list may not describe all possible interactions. Give your health care provider a list of all the medicines, herbs, non-prescription drugs, or dietary supplements you use. Also tell them if you smoke, drink alcohol, or use illegal drugs. Some items may interact with your medicine. What should I watch for while using this medication? Visit your care team for regular checks on your progress. Tell your care team if your symptoms do not start to get better or if they get worse. You may need blood work done while you are taking this medication. You may need to eat more foods that contain iron. Talk to your care team. Foods that contain iron include whole grains or cereals, dried fruits, beans, peas, leafy green vegetables, and organ meats (liver, kidney). What side effects may I notice from receiving this medication? Side effects that you should  report to your care team as soon as possible: Allergic reactions--skin rash, itching, hives, swelling of the face, lips, tongue, or throat Low blood pressure--dizziness, feeling faint or lightheaded, blurry vision Shortness of breath Side effects that usually do not require medical attention (report to your care team if they continue or are bothersome): Flushing Headache Joint pain Muscle pain Nausea Pain, redness, or irritation at injection site This list  may not describe all possible side effects. Call your doctor for medical advice about side effects. You may report side effects to FDA at 1-800-FDA-1088. Where should I keep my medication? This medication is given in a hospital or clinic. It will not be stored at home. NOTE: This sheet is a summary. It may not cover all possible information. If you have questions about this medicine, talk to your doctor, pharmacist, or health care provider.  2024 Elsevier/Gold Standard (2023-07-13 00:00:00)     To help prevent nausea and vomiting after your treatment, we encourage you to take your nausea medication as directed.  BELOW ARE SYMPTOMS THAT SHOULD BE REPORTED IMMEDIATELY: *FEVER GREATER THAN 100.4 F (38 C) OR HIGHER *CHILLS OR SWEATING *NAUSEA AND VOMITING THAT IS NOT CONTROLLED WITH YOUR NAUSEA MEDICATION *UNUSUAL SHORTNESS OF BREATH *UNUSUAL BRUISING OR BLEEDING *URINARY PROBLEMS (pain or burning when urinating, or frequent urination) *BOWEL PROBLEMS (unusual diarrhea, constipation, pain near the anus) TENDERNESS IN MOUTH AND THROAT WITH OR WITHOUT PRESENCE OF ULCERS (sore throat, sores in mouth, or a toothache) UNUSUAL RASH, SWELLING OR PAIN  UNUSUAL VAGINAL DISCHARGE OR ITCHING   Items with * indicate a potential emergency and should be followed up as soon as possible or go to the Emergency Department if any problems should occur.  Please show the CHEMOTHERAPY ALERT CARD or IMMUNOTHERAPY ALERT CARD at check-in to the Emergency Department and triage nurse.  Should you have questions after your visit or need to cancel or reschedule your appointment, please contact [facility] [place] - A DEPT OF [facility]igh[facility] Bridegroom [facility] [phone]  and follow the prompts.  Office hours are 8:00 a.m. to 4:30 p.m. Monday - Friday. Please note that voicemails left after 4:00 p.m. may not be returned until the following business day.  We are closed weekends and major holidays. You have access to a  nurse at all times for urgent questions. Please call the main number to the clinic [phone] and follow the prompts.  For any non-urgent questions, you may also contact your provider using MyChart. We now offer e-Visits for anyone 86 and older to request care online for non-urgent symptoms. For details visit [email or web address].   Also download the MyChart app! Go to the app store, search "MyChart", open the app, select [place], and log in with your MyChart username and password.

## 2023-12-30 ENCOUNTER — Other Ambulatory Visit: Payer: Self-pay | Admitting: Family Medicine

## 2023-12-30 DIAGNOSIS — F445 Conversion disorder with seizures or convulsions: Secondary | ICD-10-CM

## 2024-01-12 ENCOUNTER — Other Ambulatory Visit: Payer: Self-pay | Admitting: Family Medicine

## 2024-01-13 ENCOUNTER — Inpatient Hospital Stay: Payer: MEDICAID

## 2024-01-19 ENCOUNTER — Other Ambulatory Visit: Payer: Self-pay | Admitting: Family Medicine

## 2024-01-19 DIAGNOSIS — G8929 Other chronic pain: Secondary | ICD-10-CM

## 2024-01-20 ENCOUNTER — Telehealth: Payer: MEDICAID | Admitting: Oncology

## 2024-01-25 ENCOUNTER — Encounter: Payer: Self-pay | Admitting: Family Medicine

## 2024-01-25 ENCOUNTER — Encounter: Payer: Self-pay | Admitting: Neurology

## 2024-01-26 ENCOUNTER — Other Ambulatory Visit: Payer: Self-pay

## 2024-01-26 DIAGNOSIS — Z9884 Bariatric surgery status: Secondary | ICD-10-CM

## 2024-01-26 DIAGNOSIS — D500 Iron deficiency anemia secondary to blood loss (chronic): Secondary | ICD-10-CM

## 2024-01-27 ENCOUNTER — Inpatient Hospital Stay: Payer: MEDICAID

## 2024-02-03 ENCOUNTER — Inpatient Hospital Stay: Payer: MEDICAID

## 2024-02-06 ENCOUNTER — Encounter: Payer: Self-pay | Admitting: Family Medicine

## 2024-02-06 ENCOUNTER — Other Ambulatory Visit: Payer: Self-pay | Admitting: Family Medicine

## 2024-02-06 ENCOUNTER — Inpatient Hospital Stay: Payer: MEDICAID

## 2024-02-06 ENCOUNTER — Inpatient Hospital Stay: Payer: MEDICAID | Attending: Physician Assistant

## 2024-02-06 VITALS — BP 139/95 | HR 99 | Temp 98.2°F | Resp 18

## 2024-02-06 DIAGNOSIS — Z9884 Bariatric surgery status: Secondary | ICD-10-CM

## 2024-02-06 DIAGNOSIS — D508 Other iron deficiency anemias: Secondary | ICD-10-CM | POA: Diagnosis present

## 2024-02-06 DIAGNOSIS — D500 Iron deficiency anemia secondary to blood loss (chronic): Secondary | ICD-10-CM

## 2024-02-06 DIAGNOSIS — K909 Intestinal malabsorption, unspecified: Secondary | ICD-10-CM | POA: Insufficient documentation

## 2024-02-06 DIAGNOSIS — E538 Deficiency of other specified B group vitamins: Secondary | ICD-10-CM | POA: Insufficient documentation

## 2024-02-06 LAB — IRON AND TIBC
Iron: 46 ug/dL (ref 28–170)
Saturation Ratios: 14 % (ref 10.4–31.8)
TIBC: 338 ug/dL (ref 250–450)
UIBC: 292 ug/dL

## 2024-02-06 LAB — CBC WITH DIFFERENTIAL/PLATELET
Abs Immature Granulocytes: 0.04 10*3/uL (ref 0.00–0.07)
Basophils Absolute: 0 10*3/uL (ref 0.0–0.1)
Basophils Relative: 0 %
Eosinophils Absolute: 0.2 10*3/uL (ref 0.0–0.5)
Eosinophils Relative: 3 %
HCT: 41.7 % (ref 36.0–46.0)
Hemoglobin: 13.7 g/dL (ref 12.0–15.0)
Immature Granulocytes: 1 %
Lymphocytes Relative: 31 %
Lymphs Abs: 1.5 10*3/uL (ref 0.7–4.0)
MCH: 30.5 pg (ref 26.0–34.0)
MCHC: 32.9 g/dL (ref 30.0–36.0)
MCV: 92.9 fL (ref 80.0–100.0)
Monocytes Absolute: 0.4 10*3/uL (ref 0.1–1.0)
Monocytes Relative: 9 %
Neutro Abs: 2.7 10*3/uL (ref 1.7–7.7)
Neutrophils Relative %: 56 %
Platelets: 248 10*3/uL (ref 150–400)
RBC: 4.49 MIL/uL (ref 3.87–5.11)
RDW: 13.6 % (ref 11.5–15.5)
WBC: 4.8 10*3/uL (ref 4.0–10.5)
nRBC: 0 % (ref 0.0–0.2)

## 2024-02-06 LAB — VITAMIN B12: Vitamin B-12: 745 pg/mL (ref 180–914)

## 2024-02-06 LAB — FERRITIN: Ferritin: 12 ng/mL (ref 11–307)

## 2024-02-06 MED ORDER — CYANOCOBALAMIN 1000 MCG/ML IJ SOLN
1000.0000 ug | Freq: Once | INTRAMUSCULAR | Status: AC
  Administered 2024-02-06: 1000 ug via INTRAMUSCULAR
  Filled 2024-02-06: qty 1

## 2024-02-06 NOTE — Patient Instructions (Signed)
 Vitamin B12 Injection What is this medication? Vitamin B12 (VAHY tuh min B12) prevents and treats low vitamin B12 levels in your body. It is used in people who do not get enough vitamin B12 from their diet or when their digestive tract does not absorb enough. Vitamin B12 plays an important role in maintaining the health of your nervous system and red blood cells. This medicine may be used for other purposes; ask your health care provider or pharmacist if you have questions. COMMON BRAND NAME(S): B-12 Compliance Kit, B-12 Injection Kit, Cyomin, Dodex, LA-12, Nutri-Twelve, Physicians EZ Use B-12, Primabalt, Vitamin Deficiency Injectable System - B12 What should I tell my care team before I take this medication? They need to know if you have any of these conditions: Kidney disease Leber's disease Megaloblastic anemia An unusual or allergic reaction to cyanocobalamin, cobalt, other medications, foods, dyes, or preservatives Pregnant or trying to get pregnant Breast-feeding How should I use this medication? This medication is injected into a muscle or deeply under the skin. It is usually given in a clinic or care team's office. However, your care team may teach you how to inject yourself. Follow all instructions. Talk to your care team about the use of this medication in children. Special care may be needed. Overdosage: If you think you have taken too much of this medicine contact a poison control center or emergency room at once. NOTE: This medicine is only for you. Do not share this medicine with others. What if I miss a dose? If you are given your dose at a clinic or care team's office, call to reschedule your appointment. If you give your own injections, and you miss a dose, take it as soon as you can. If it is almost time for your next dose, take only that dose. Do not take double or extra doses. What may interact with this medication? Alcohol Colchicine This list may not describe all possible  interactions. Give your health care provider a list of all the medicines, herbs, non-prescription drugs, or dietary supplements you use. Also tell them if you smoke, drink alcohol, or use illegal drugs. Some items may interact with your medicine. What should I watch for while using this medication? Visit your care team regularly. You may need blood work done while you are taking this medication. You may need to follow a special diet. Talk to your care team. Limit your alcohol intake and avoid smoking to get the best benefit. What side effects may I notice from receiving this medication? Side effects that you should report to your care team as soon as possible: Allergic reactions--skin rash, itching, hives, swelling of the face, lips, tongue, or throat Swelling of the ankles, hands, or feet Trouble breathing Side effects that usually do not require medical attention (report to your care team if they continue or are bothersome): Diarrhea This list may not describe all possible side effects. Call your doctor for medical advice about side effects. You may report side effects to FDA at 1-800-FDA-1088. Where should I keep my medication? Keep out of the reach of children. Store at room temperature between 15 and 30 degrees C (59 and 85 degrees F). Protect from light. Throw away any unused medication after the expiration date. NOTE: This sheet is a summary. It may not cover all possible information. If you have questions about this medicine, talk to your doctor, pharmacist, or health care provider.  2024 Elsevier/Gold Standard (2021-08-04 00:00:00)

## 2024-02-06 NOTE — Telephone Encounter (Signed)
 Front  I did do a letter of support for disability Please print the letter and provide to the patient  It would also be wise for her to schedule a standard follow-up office visit to continue to document her ongoing health issues and how this impacts her day-to-day living  This appointment can be scheduled at her convenience  Thank you-Dr. Lorin Picket

## 2024-02-06 NOTE — Progress Notes (Signed)
 Patient tolerated B12 injection with no complaints voiced.  Site clean and dry with no bruising or swelling noted at site.  See MAR for details.  Band aid applied.  Patient stable during and after injection.  Vss with discharge and left in satisfactory condition with no s/s of distress noted. All follow ups as scheduled.   Penny [name] [employer]

## 2024-02-08 ENCOUNTER — Other Ambulatory Visit: Payer: Self-pay | Admitting: Family Medicine

## 2024-02-08 ENCOUNTER — Encounter: Payer: Self-pay | Admitting: Family Medicine

## 2024-02-08 DIAGNOSIS — G8929 Other chronic pain: Secondary | ICD-10-CM

## 2024-02-08 MED ORDER — CYCLOBENZAPRINE HCL 10 MG PO TABS: ORAL_TABLET | ORAL | 3 refills | Status: DC

## 2024-02-08 NOTE — Progress Notes (Unsigned)
 Virtual Visit via Telephone Note  I connected with Penny [name] on 02/08/24 at  8:30 AM EST by telephone and verified that I am speaking with the correct person using two identifiers.  Location: Patient: Home  Provider: Clinic   I discussed the limitations, risks, security and privacy concerns of performing an evaluation and management service by telephone and the availability of in person appointments. I also discussed with the patient that there may be a patient responsible charge related to this service. The patient expressed understanding and agreed to proceed.   History of Present Illness: Patient is a 38 year old female who is followed by hematology for iron deficiency anemia.  She last received IV Feraheme on 10/07/2023 and 12/23/2023.  She has been receiving monthly B12 injections.  Previously had side effects to IV iron including syncope, seeing spots, headache and nausea but tolerated most recent infusion well.  Has noticed some increasing fatigue over the past couple of months.  Energy levels are low.  Appetite is low.  Has intermittent nausea and takes Zofran.  Denies any bleeding, melena, hematochezia or bright red blood per rectum.  Continues to have chronic low back pain does not take any medicine due to history of opiate abuse.  She sees a [facility].    Observations/Objective: ROS  Physical Exam Neurological:     Mental Status: She is alert and oriented to person, place, and time.     Assessment and Plan: 1. 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 -Labs from 02/06/2024 show a ferritin of 12, iron saturation 14% with a normal TIBC.  Hemoglobin is 13.7. -Recommend 2 additional doses of IV Feraheme.  2.  B12 deficiency: -Most recent B12 level is 745 from a 02/06/2024. -She has been receiving monthly B12 since October 2024.  Follow Up Instructions:    I discussed the assessment and  treatment plan with the patient. The patient was provided an opportunity to ask questions and all were answered. The patient agreed with the plan and demonstrated an understanding of the instructions.   The patient was advised to call back or seek an in-person evaluation if the symptoms worsen or if the condition fails to improve as anticipated.  I provided *** minutes of non-face-to-face time during this encounter.   Mauro Kaufmann, NP

## 2024-02-08 NOTE — Telephone Encounter (Signed)
 Prescription for Flexeril was sent and caution drowsiness use sparingly  Do not take tizanidine with this because both of those are muscle relaxers so another words cancel tizanidine thank you  May share message with Penny [name]  Also please have front office assist her in setting up a follow-up with me thank you

## 2024-02-09 ENCOUNTER — Inpatient Hospital Stay: Payer: MEDICAID | Admitting: Oncology

## 2024-02-09 ENCOUNTER — Encounter: Payer: Self-pay | Admitting: Oncology

## 2024-02-09 DIAGNOSIS — D500 Iron deficiency anemia secondary to blood loss (chronic): Secondary | ICD-10-CM | POA: Diagnosis not present

## 2024-02-09 DIAGNOSIS — Z9884 Bariatric surgery status: Secondary | ICD-10-CM | POA: Diagnosis not present

## 2024-02-09 LAB — METHYLMALONIC ACID, SERUM: Methylmalonic Acid, Quantitative: 106 nmol/L (ref 0–378)

## 2024-02-10 ENCOUNTER — Inpatient Hospital Stay: Payer: MEDICAID

## 2024-02-13 ENCOUNTER — Inpatient Hospital Stay: Payer: MEDICAID

## 2024-02-13 VITALS — BP 110/76 | HR 76 | Temp 98.3°F | Resp 18

## 2024-02-13 DIAGNOSIS — D508 Other iron deficiency anemias: Secondary | ICD-10-CM | POA: Diagnosis not present

## 2024-02-13 DIAGNOSIS — D500 Iron deficiency anemia secondary to blood loss (chronic): Secondary | ICD-10-CM

## 2024-02-13 MED ORDER — ONDANSETRON HCL 4 MG/2ML IJ SOLN
8.0000 mg | Freq: Once | INTRAMUSCULAR | Status: AC
  Administered 2024-02-13: 8 mg via INTRAVENOUS
  Filled 2024-02-13: qty 4

## 2024-02-13 MED ORDER — SODIUM CHLORIDE 0.9 % IV SOLN
510.0000 mg | Freq: Once | INTRAVENOUS | Status: AC
  Administered 2024-02-13: 510 mg via INTRAVENOUS
  Filled 2024-02-13: qty 510

## 2024-02-13 MED ORDER — METHYLPREDNISOLONE SODIUM SUCC 125 MG IJ SOLR
125.0000 mg | Freq: Once | INTRAMUSCULAR | Status: AC
  Administered 2024-02-13: 125 mg via INTRAVENOUS
  Filled 2024-02-13: qty 2

## 2024-02-13 MED ORDER — SODIUM CHLORIDE 0.9 % IV SOLN: INTRAVENOUS | Status: DC

## 2024-02-13 MED ORDER — CETIRIZINE HCL 10 MG PO TABS
10.0000 mg | ORAL_TABLET | Freq: Once | ORAL | Status: AC
  Administered 2024-02-13: 10 mg via ORAL
  Filled 2024-02-13: qty 1

## 2024-02-13 NOTE — Patient Instructions (Signed)
 [facility] [place] - A [facility]PT [facility]  Discharge Instructions: Thank you for choosing [facility] to provide your oncology and hematology care.  If you have a lab appointment with the Cancer Center - please note that after April 8th, 2024, all labs will be drawn in the cancer center.  You do not have to check in or register with the main entrance as you have in the past but will complete your check-in in the cancer center.  Wear comfortable clothing and clothing appropriate for easy access to any Portacath or PICC line.   We strive to give you quality time with your provider. You may need to reschedule your appointment if you arrive late (15 or more minutes).  Arriving late affects you and other patients whose appointments are after yours.  Also, if you miss three or more appointments without notifying the office, you may be dismissed from the clinic at the provider's discretion.      For prescription refill requests, have your pharmacy contact our office and allow 72 hours for refills to be completed.    Today you received the following:  Feraheme.  Ferumoxytol Injection What is this medication? FERUMOXYTOL (FER ue MOX i tol) treats low levels of iron in your body (iron deficiency anemia). Iron is a mineral that plays an important role in making red blood cells, which carry oxygen from your lungs to the rest of your body. This medicine may be used for other purposes; ask your health care provider or pharmacist if you have questions. COMMON BRAND NAME(S): Feraheme What should I tell my care team before I take this medication? They need to know if you have any of these conditions: Anemia not caused by low iron levels High levels of iron in the blood Magnetic resonance imaging (MRI) test scheduled An unusual or allergic reaction to iron, other medications, foods, dyes, or preservatives Pregnant or trying to get pregnant Breastfeeding How should I  use this medication? This medication is injected into a vein. It is given by your care team in a hospital or clinic setting. Talk to your care team the use of this medication in children. Special care may be needed. Overdosage: If you think you have taken too much of this medicine contact a poison control center or emergency room at once. NOTE: This medicine is only for you. Do not share this medicine with others. What if I miss a dose? It is important not to miss your dose. Call your care team if you are unable to keep an appointment. What may interact with this medication? Other iron products This list may not describe all possible interactions. Give your health care provider a list of all the medicines, herbs, non-prescription drugs, or dietary supplements you use. Also tell them if you smoke, drink alcohol, or use illegal drugs. Some items may interact with your medicine. What should I watch for while using this medication? Visit your care team for regular checks on your progress. Tell your care team if your symptoms do not start to get better or if they get worse. You may need blood work done while you are taking this medication. You may need to eat more foods that contain iron. Talk to your care team. Foods that contain iron include whole grains or cereals, dried fruits, beans, peas, leafy green vegetables, and organ meats (liver, kidney). What side effects may I notice from receiving this medication? Side effects that you should  report to your care team as soon as possible: Allergic reactions--skin rash, itching, hives, swelling of the face, lips, tongue, or throat Low blood pressure--dizziness, feeling faint or lightheaded, blurry vision Shortness of breath Side effects that usually do not require medical attention (report to your care team if they continue or are bothersome): Flushing Headache Joint pain Muscle pain Nausea Pain, redness, or irritation at injection site This list  may not describe all possible side effects. Call your doctor for medical advice about side effects. You may report side effects to FDA at 1-800-FDA-1088. Where should I keep my medication? This medication is given in a hospital or clinic. It will not be stored at home. NOTE: This sheet is a summary. It may not cover all possible information. If you have questions about this medicine, talk to your doctor, pharmacist, or health care provider.  2024 Elsevier/Gold Standard (2023-07-13 00:00:00)    To help prevent nausea and vomiting after your treatment, we encourage you to take your nausea medication as directed.  BELOW ARE SYMPTOMS THAT SHOULD BE REPORTED IMMEDIATELY: *FEVER GREATER THAN 100.4 F (38 C) OR HIGHER *CHILLS OR SWEATING *NAUSEA AND VOMITING THAT IS NOT CONTROLLED WITH YOUR NAUSEA MEDICATION *UNUSUAL SHORTNESS OF BREATH *UNUSUAL BRUISING OR BLEEDING *URINARY PROBLEMS (pain or burning when urinating, or frequent urination) *BOWEL PROBLEMS (unusual diarrhea, constipation, pain near the anus) TENDERNESS IN MOUTH AND THROAT WITH OR WITHOUT PRESENCE OF ULCERS (sore throat, sores in mouth, or a toothache) UNUSUAL RASH, SWELLING OR PAIN  UNUSUAL VAGINAL DISCHARGE OR ITCHING   Items with * indicate a potential emergency and should be followed up as soon as possible or go to the Emergency Department if any problems should occur.  Please show the CHEMOTHERAPY ALERT CARD or IMMUNOTHERAPY ALERT CARD at check-in to the Emergency Department and triage nurse.  Should you have questions after your visit or need to cancel or reschedule your appointment, please contact [facility] [place] - A DEPT OF [facility] Bridegroom [facility] [phone]  and follow the prompts.  Office hours are 8:00 a.m. to 4:30 p.m. Monday - Friday. Please note that voicemails left after 4:00 p.m. may not be returned until the following business day.  We are closed weekends and major holidays. You have access to a  nurse at all times for urgent questions. Please call the main number to the clinic [phone] and follow the prompts.  For any non-urgent questions, you may also contact your provider using MyChart. We now offer e-Visits for anyone 39 and older to request care online for non-urgent symptoms. For details visit [email or web address].   Also download the MyChart app! Go to the app store, search "MyChart", open the app, select [place], and log in with your MyChart username and password.

## 2024-02-13 NOTE — Progress Notes (Signed)
 Patient presents today for iron infusion.  Patient is in satisfactory condition with no new complaints voiced.  Vital signs are stable.  IV placed in R hand.  IV flushed well with good blood return noted.  We will proceed with infusion per provider orders.    Patient tolerated infusion well with no complaints voiced.  Patient left ambulatory in stable condition.  Vital signs stable at discharge.  Follow up as scheduled.

## 2024-02-21 ENCOUNTER — Inpatient Hospital Stay: Payer: MEDICAID

## 2024-02-21 VITALS — BP 117/79 | HR 87 | Temp 98.7°F | Resp 18

## 2024-02-21 DIAGNOSIS — D500 Iron deficiency anemia secondary to blood loss (chronic): Secondary | ICD-10-CM

## 2024-02-21 DIAGNOSIS — D508 Other iron deficiency anemias: Secondary | ICD-10-CM | POA: Diagnosis not present

## 2024-02-21 MED ORDER — DIPHENHYDRAMINE HCL 50 MG/ML IJ SOLN: 50.0000 mg | Freq: Once | INTRAMUSCULAR | Status: DC | PRN

## 2024-02-21 MED ORDER — FAMOTIDINE IN NACL 20-0.9 MG/50ML-% IV SOLN: 20.0000 mg | Freq: Once | INTRAVENOUS | Status: DC | PRN

## 2024-02-21 MED ORDER — SODIUM CHLORIDE 0.9 % IV SOLN
510.0000 mg | Freq: Once | INTRAVENOUS | Status: AC
  Administered 2024-02-21: 510 mg via INTRAVENOUS
  Filled 2024-02-21: qty 510

## 2024-02-21 MED ORDER — SODIUM CHLORIDE 0.9 % IV SOLN: Freq: Once | INTRAVENOUS | Status: DC | PRN

## 2024-02-21 MED ORDER — METHYLPREDNISOLONE SODIUM SUCC 125 MG IJ SOLR
125.0000 mg | Freq: Once | INTRAMUSCULAR | Status: AC
  Administered 2024-02-21: 125 mg via INTRAVENOUS
  Filled 2024-02-21: qty 2

## 2024-02-21 MED ORDER — ONDANSETRON HCL 4 MG/2ML IJ SOLN
8.0000 mg | Freq: Once | INTRAMUSCULAR | Status: AC
  Administered 2024-02-21: 8 mg via INTRAVENOUS
  Filled 2024-02-21: qty 4

## 2024-02-21 MED ORDER — ALBUTEROL SULFATE HFA 108 (90 BASE) MCG/ACT IN AERS: 2.0000 | INHALATION_SPRAY | Freq: Once | RESPIRATORY_TRACT | Status: DC | PRN

## 2024-02-21 MED ORDER — EPINEPHRINE 0.3 MG/0.3ML IJ SOAJ: 0.3000 mg | Freq: Once | INTRAMUSCULAR | Status: DC | PRN

## 2024-02-21 MED ORDER — CETIRIZINE HCL 10 MG PO TABS
10.0000 mg | ORAL_TABLET | Freq: Once | ORAL | Status: AC
  Administered 2024-02-21: 10 mg via ORAL
  Filled 2024-02-21: qty 1

## 2024-02-21 NOTE — Patient Instructions (Signed)

## 2024-02-21 NOTE — Progress Notes (Signed)
 Patient tolerated iron infusion with no complaints voiced.  Peripheral IV site clean and dry with good blood return noted before and after infusion.  Band aid applied.  VSS with discharge and left in satisfactory condition with no s/s of distress noted.

## 2024-02-23 ENCOUNTER — Other Ambulatory Visit: Payer: Self-pay | Admitting: Family Medicine

## 2024-02-23 DIAGNOSIS — F445 Conversion disorder with seizures or convulsions: Secondary | ICD-10-CM

## 2024-02-24 ENCOUNTER — Inpatient Hospital Stay: Payer: MEDICAID

## 2024-02-25 IMAGING — CT CT HEAD W/O CM
4 series · 16 of 47 positions shown, 18 images · non-contrast
Comparison: 07/29/2021

CLINICAL DATA: Head trauma, moderate-severe



[Series 2: head w o · axial · 0.42mm/px · z∈[+37,+142]mm · 7 of 29 slices shown, 9 images]
[im 4/29  brain]
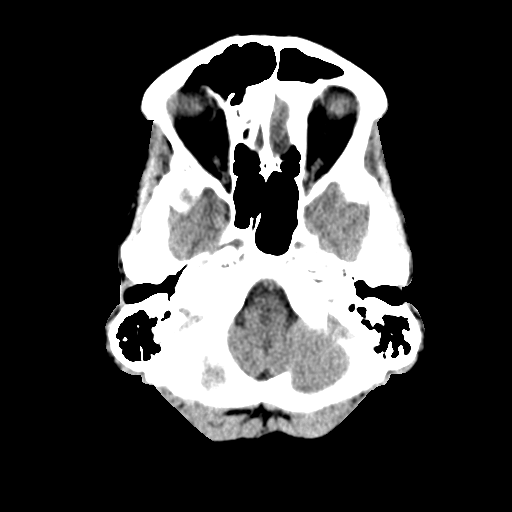
[im 4/29  bone]
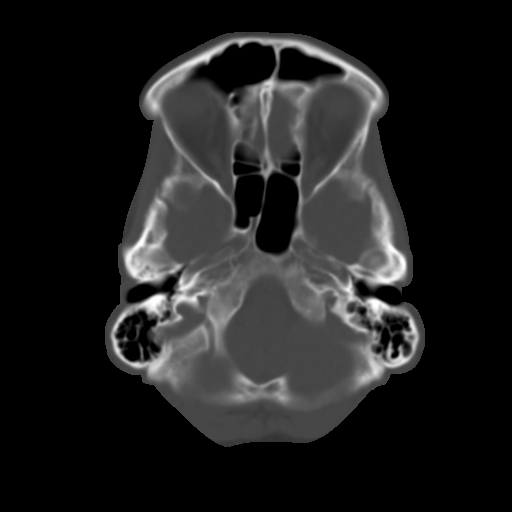
[im 8/29  brain]
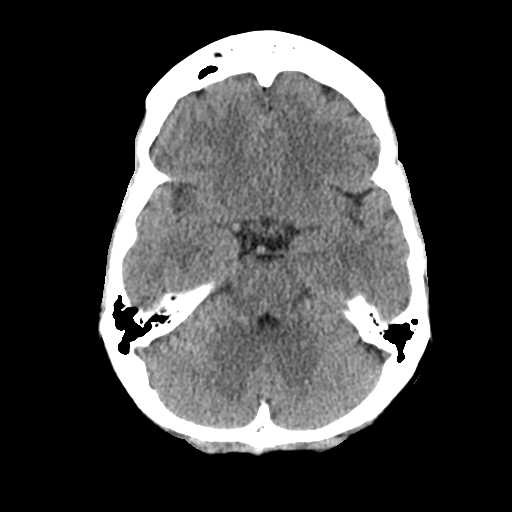
[im 11/29  brain]
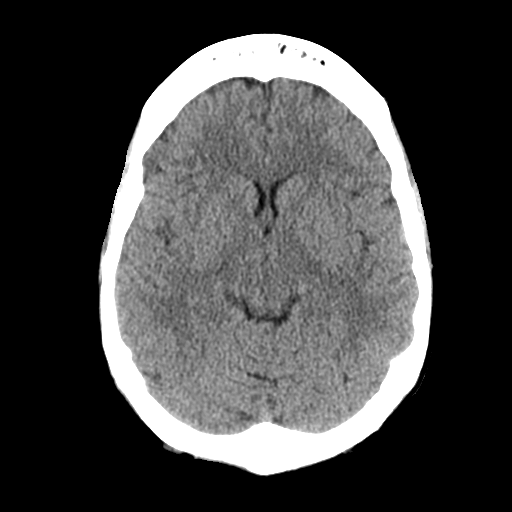
[im 15/29  brain]
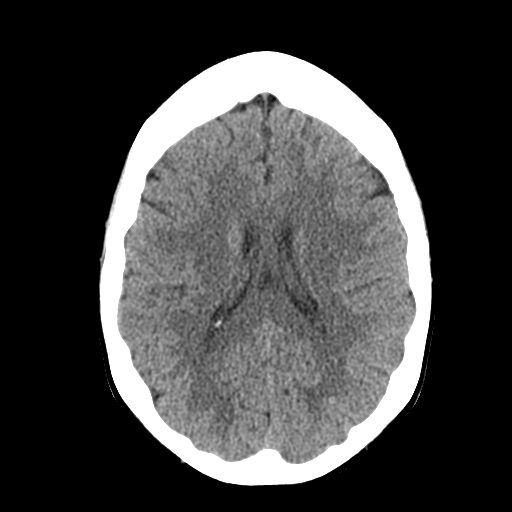
[im 18/29  brain]
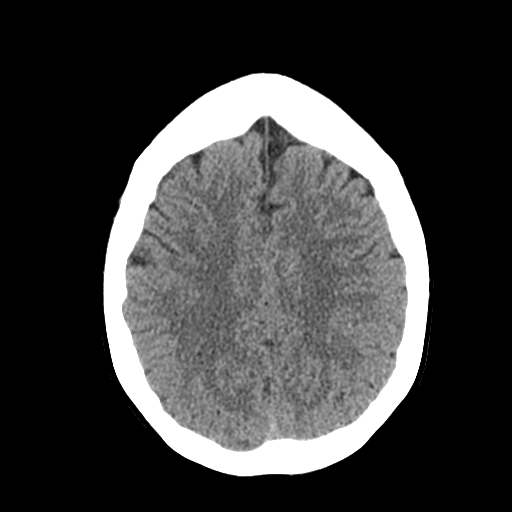
[im 18/29  bone]
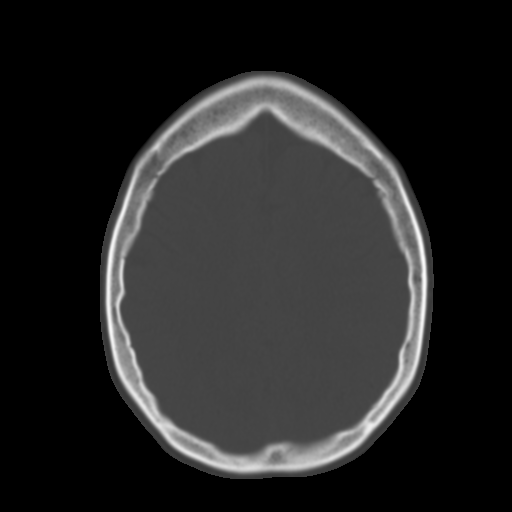
[im 22/29  brain]
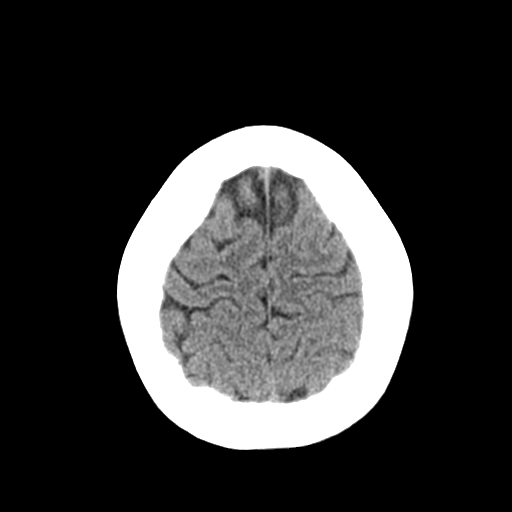
[im 25/29  brain]
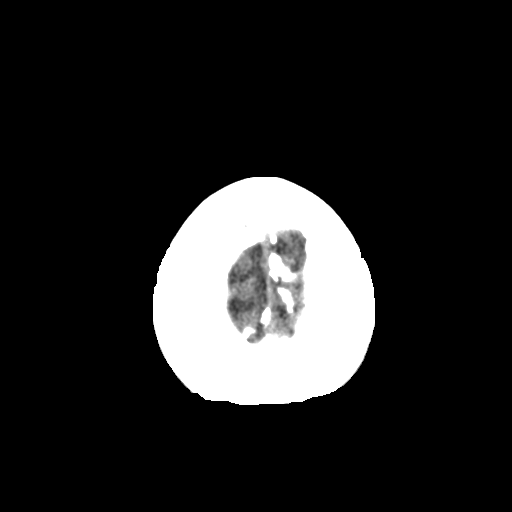

[Series 3: head bone · axial · 0.42mm/px · z∈[+36,+64]mm · 3 of 71 slices shown]
[im 8/71  bone]
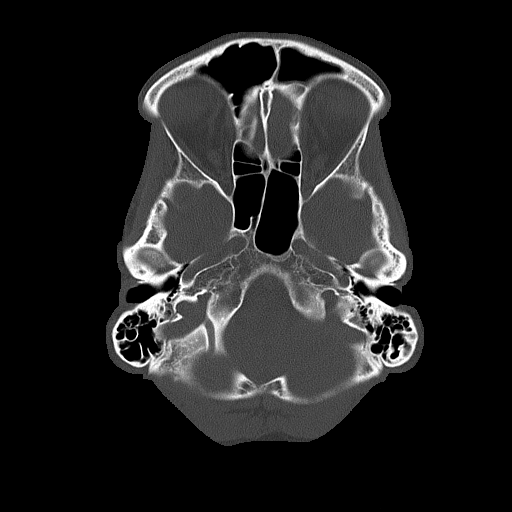
[im 15/71  bone]
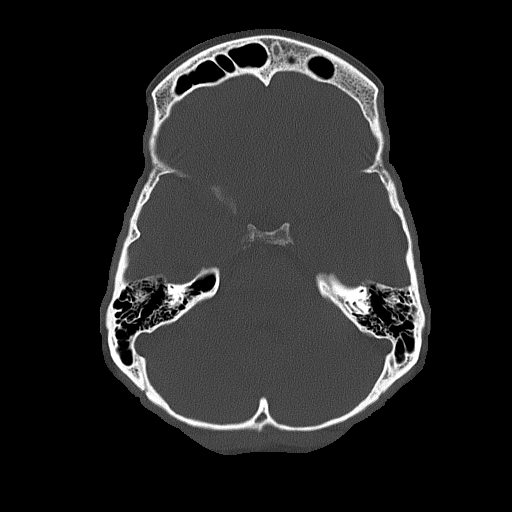
[im 22/71  bone]
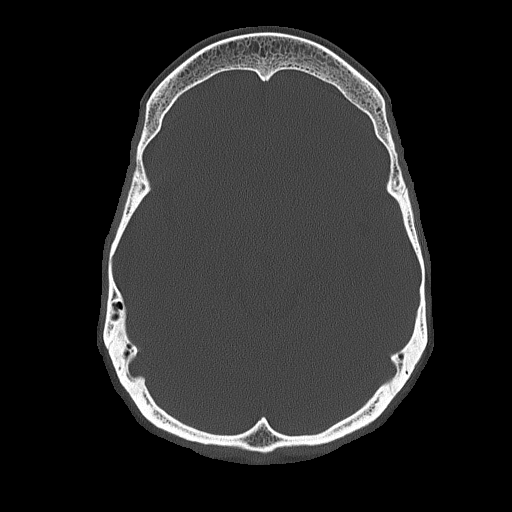

[Series 4: coronal soft · coronal · 0.32mm/px · 3 of 76 slices shown]
[im 26/76  brain]
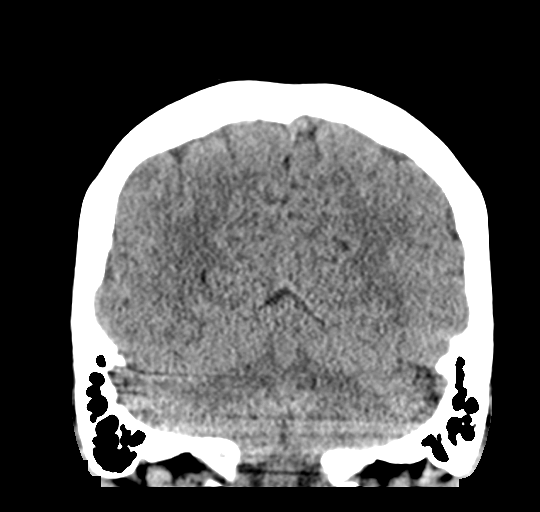
[im 34/76  brain]
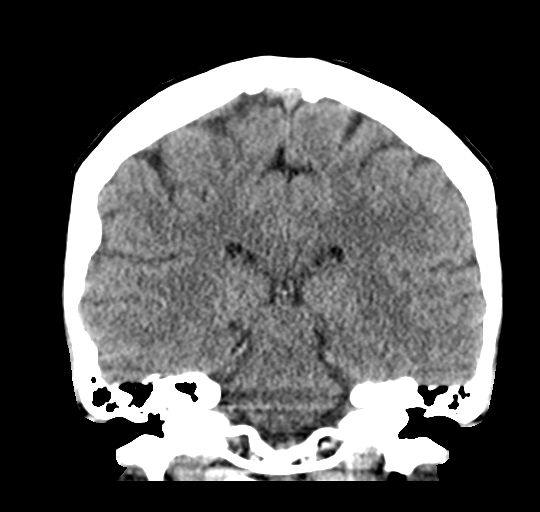
[im 42/76  brain]
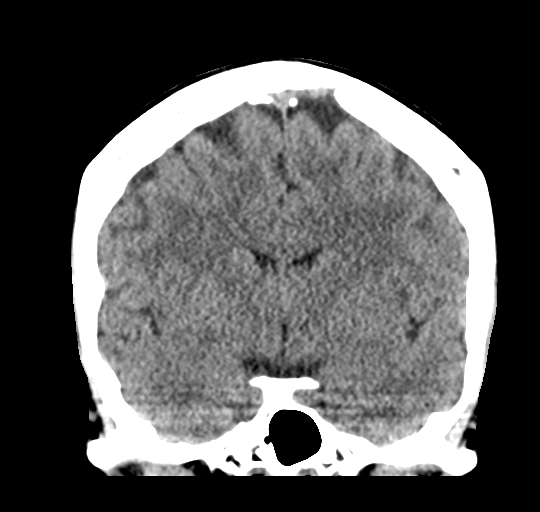

[Series 5: sagittal soft · sagittal · 0.33mm/px · 3 of 59 slices shown]
[im 20/59  brain]
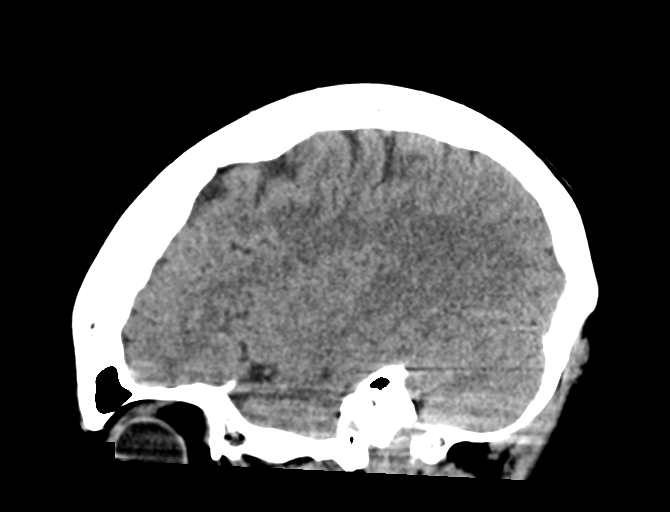
[im 30/59  brain]
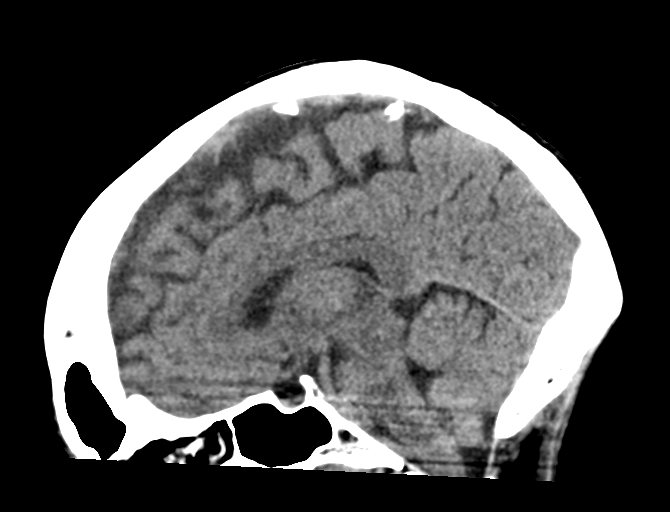
[im 39/59  brain]
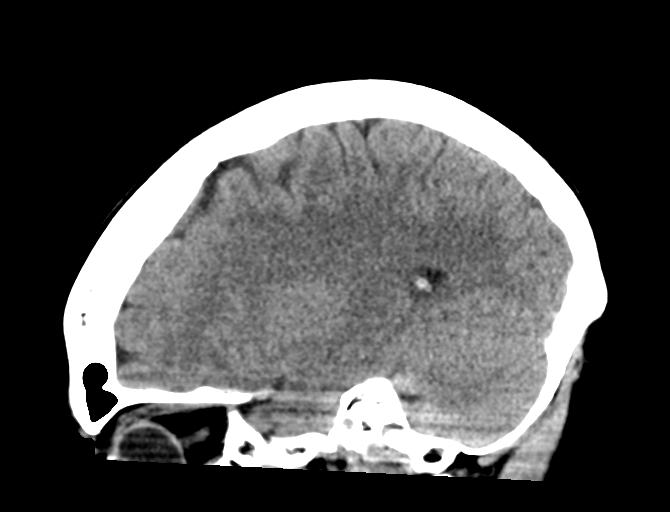

[16 of 47 positions shown; findings below may reference images not displayed]

FINDINGS: Brain: No acute intracranial abnormality. Specifically, no
hemorrhage, hydrocephalus, mass lesion, acute infarction, or
significant intracranial injury.

Vascular: No hyperdense vessel or unexpected calcification.

Skull: No acute calvarial abnormality.

Sinuses/Orbits: Ung thickening in the left ethmoid air cells and
frontal sinus. No air-fluid levels.

Other: None
IMPRESSION: No acute intracranial abnormality.

Left chronic sinusitis.

## 2024-02-25 IMAGING — DX DG RIBS W/ CHEST 3+V*L*
3 series · 3 of 3 positions shown · non-contrast
Comparison: 05/07/2021

CLINICAL DATA: Chest pain after a fall yesterday.

EXAM:
LEFT RIBS AND CHEST - 3+ VIEW

[chest pa]
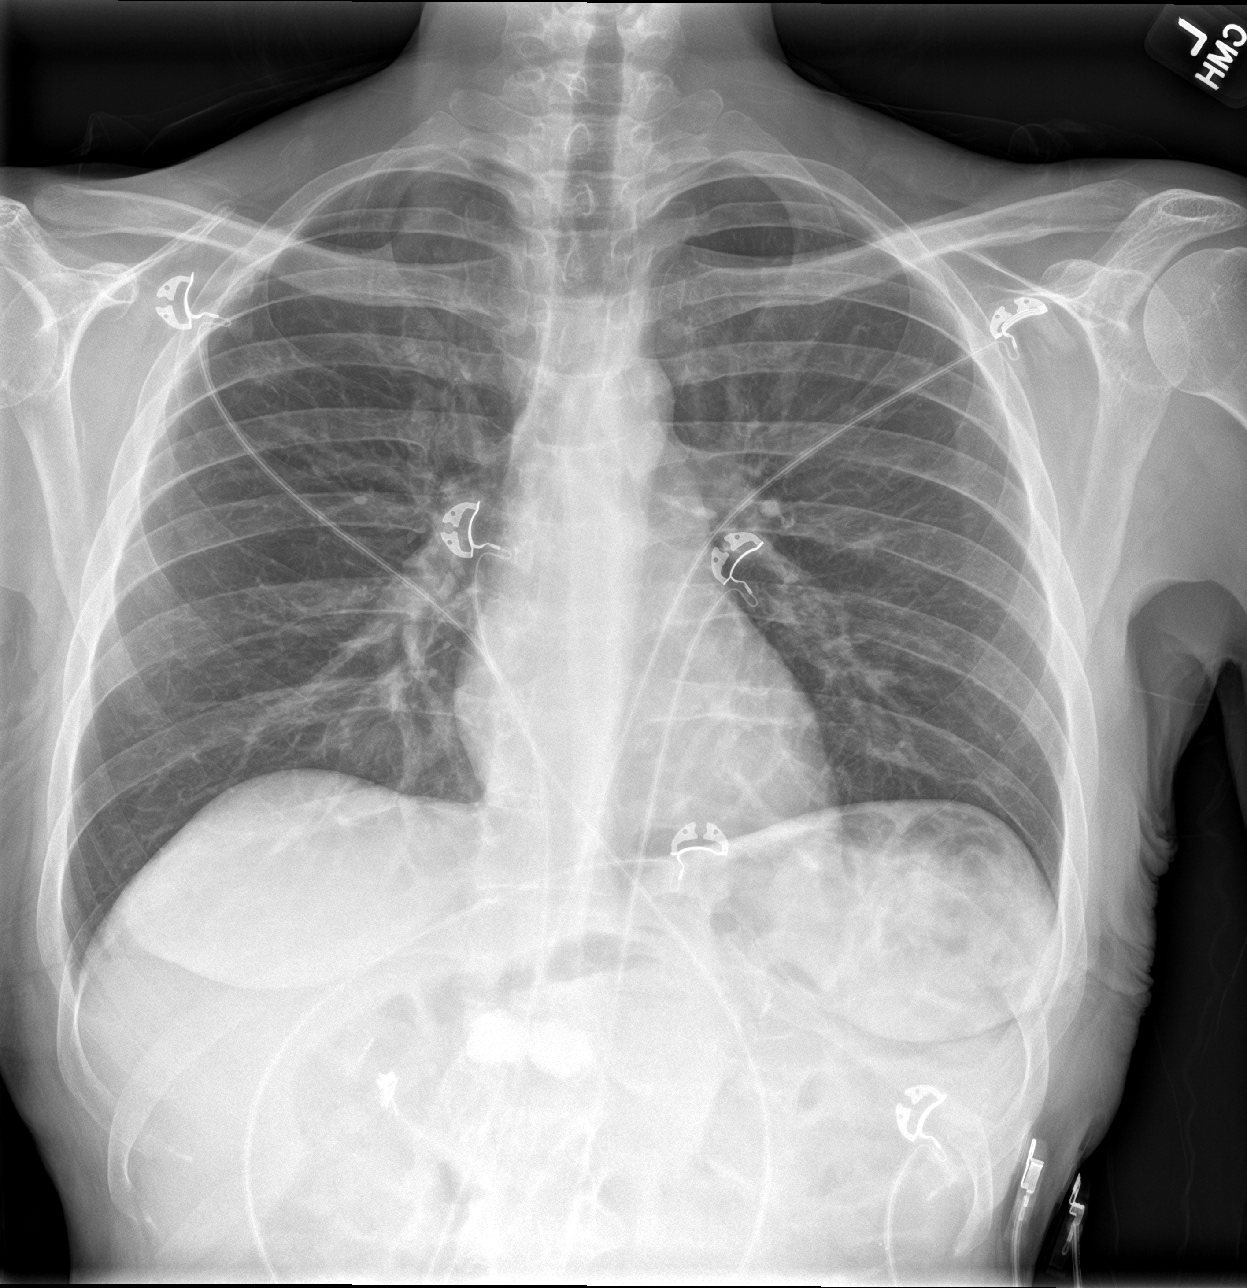

[rib pa (1 of 2)]
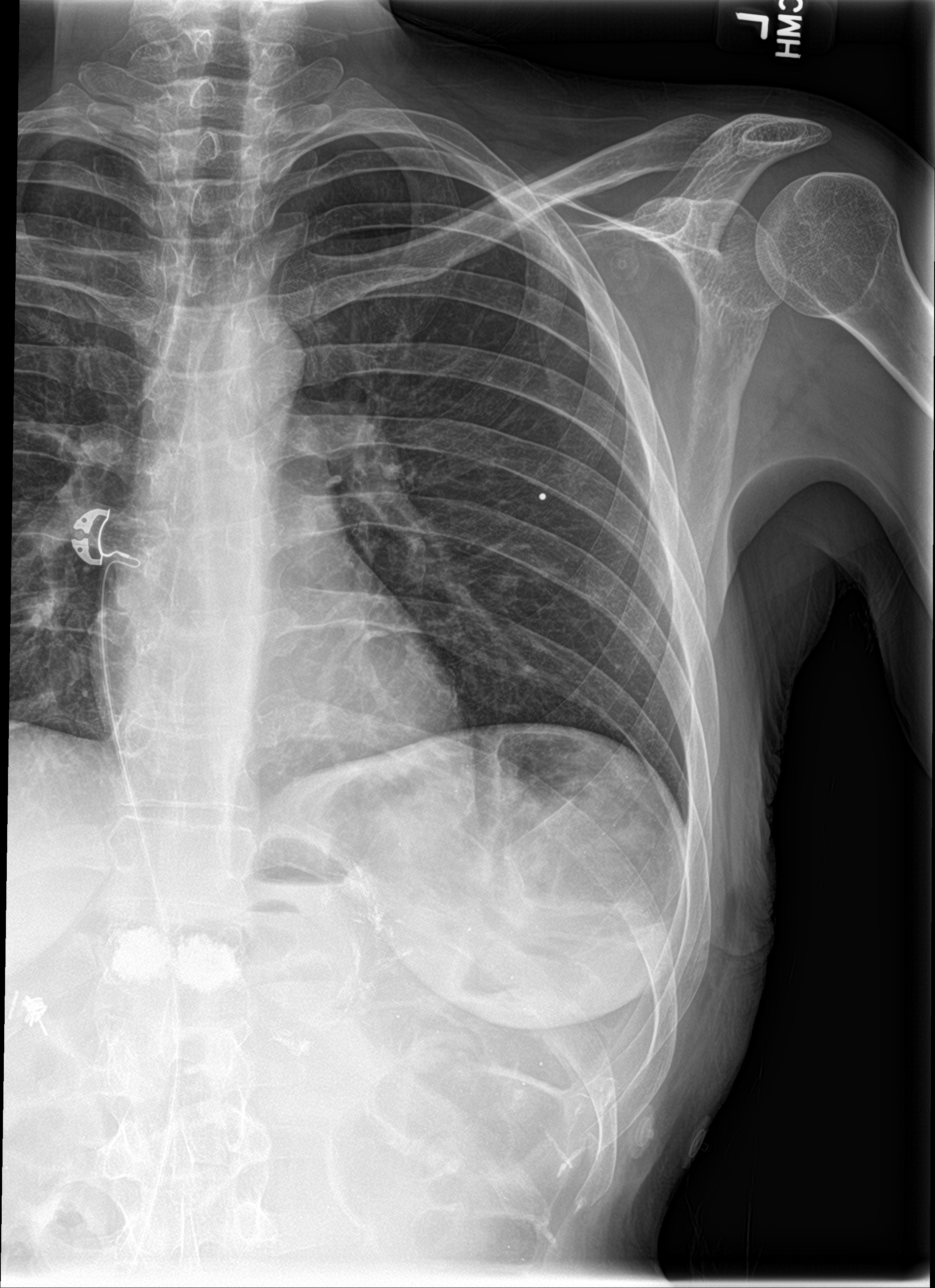

[rib pa (2 of 2)]
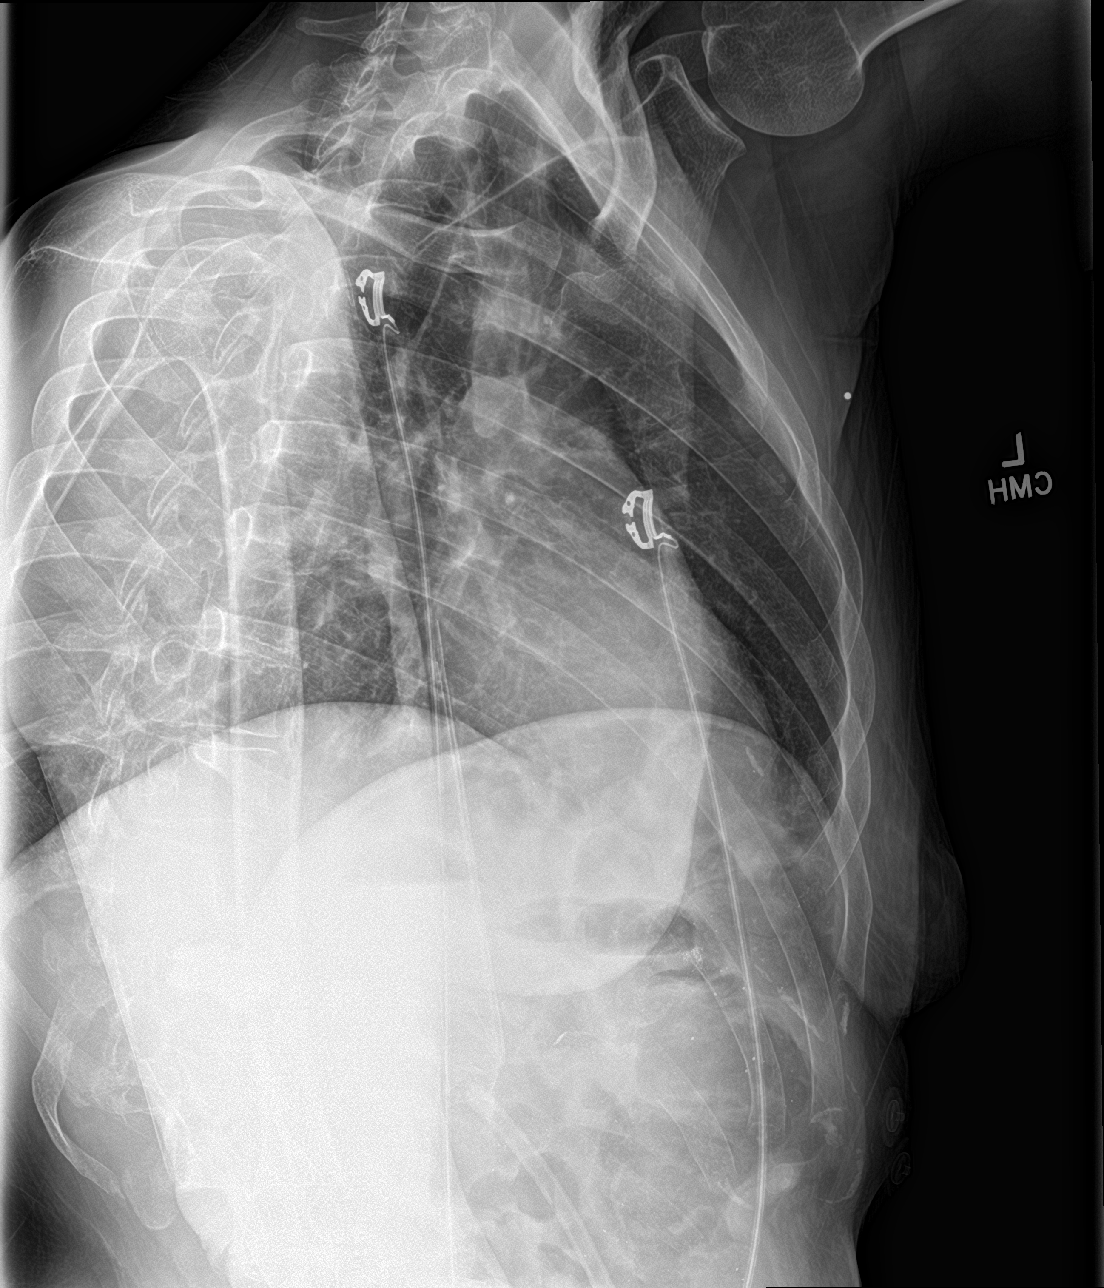

[3 of 3 positions shown; findings below may reference images not displayed]

FINDINGS: Normal heart size and pulmonary vascularity. No focal airspace
disease or consolidation in the lungs. No blunting of costophrenic
angles. No pneumothorax. Mediastinal contours appear intact.

The left ribs appear intact. No acute displaced fractures
identified. No focal bone lesion or bone destruction. Soft tissues
are unremarkable.
IMPRESSION: No evidence of active pulmonary disease.  Negative left ribs.

## 2024-02-28 ENCOUNTER — Inpatient Hospital Stay: Payer: MEDICAID

## 2024-02-28 VITALS — BP 104/72 | HR 79 | Temp 97.6°F | Resp 18 | Wt 164.4 lb

## 2024-02-28 DIAGNOSIS — D508 Other iron deficiency anemias: Secondary | ICD-10-CM | POA: Diagnosis not present

## 2024-02-28 DIAGNOSIS — D500 Iron deficiency anemia secondary to blood loss (chronic): Secondary | ICD-10-CM

## 2024-02-28 MED ORDER — CETIRIZINE HCL 10 MG PO TABS
10.0000 mg | ORAL_TABLET | Freq: Once | ORAL | Status: AC
  Administered 2024-02-28: 10 mg via ORAL
  Filled 2024-02-28: qty 1

## 2024-02-28 MED ORDER — SODIUM CHLORIDE 0.9 % IV SOLN
510.0000 mg | Freq: Once | INTRAVENOUS | Status: AC
  Administered 2024-02-28: 510 mg via INTRAVENOUS
  Filled 2024-02-28: qty 510

## 2024-02-28 MED ORDER — SODIUM CHLORIDE 0.9 % IV SOLN
INTRAVENOUS | Status: DC
Start: 2024-02-28 — End: 2024-02-28

## 2024-02-28 MED ORDER — METHYLPREDNISOLONE SODIUM SUCC 125 MG IJ SOLR
125.0000 mg | Freq: Once | INTRAMUSCULAR | Status: AC
  Administered 2024-02-28: 125 mg via INTRAVENOUS
  Filled 2024-02-28: qty 2

## 2024-02-28 MED ORDER — ONDANSETRON HCL 4 MG/2ML IJ SOLN
8.0000 mg | Freq: Once | INTRAMUSCULAR | Status: AC
  Administered 2024-02-28: 8 mg via INTRAVENOUS
  Filled 2024-02-28: qty 4

## 2024-02-28 NOTE — Patient Instructions (Signed)
 [facility] [place] - A DEPT OF [facility]  Discharge Instructions: Thank you for choosing [facility] to provide your oncology and hematology care.  If you have a lab appointment with the Cancer Center - please note that after April 8th, 2024, all labs will be drawn in the cancer center.  You do not have to check in or register with the main entrance as you have in the past but will complete your check-in in the cancer center.  Wear comfortable clothing and clothing appropriate for easy access to any Portacath or PICC line.   We strive to give you quality time with your provider. You may need to reschedule your appointment if you arrive late (15 or more minutes).  Arriving late affects you and other patients whose appointments are after yours.  Also, if you miss three or more appointments without notifying the office, you may be dismissed from the clinic at the provider's discretion.      For prescription refill requests, have your pharmacy contact our office and allow 72 hours for refills to be completed.    Today you received the following Feraheme, return as scheduled.   To help prevent nausea and vomiting after your treatment, we encourage you to take your nausea medication as directed.  BELOW ARE SYMPTOMS THAT SHOULD BE REPORTED IMMEDIATELY: *FEVER GREATER THAN 100.4 F (38 C) OR HIGHER *CHILLS OR SWEATING *NAUSEA AND VOMITING THAT IS NOT CONTROLLED WITH YOUR NAUSEA MEDICATION *UNUSUAL SHORTNESS OF BREATH *UNUSUAL BRUISING OR BLEEDING *URINARY PROBLEMS (pain or burning when urinating, or frequent urination) *BOWEL PROBLEMS (unusual diarrhea, constipation, pain near the anus) TENDERNESS IN MOUTH AND THROAT WITH OR WITHOUT PRESENCE OF ULCERS (sore throat, sores in mouth, or a toothache) UNUSUAL RASH, SWELLING OR PAIN  UNUSUAL VAGINAL DISCHARGE OR ITCHING   Items with * indicate a potential emergency and should be followed up as soon as possible or  go to the Emergency Department if any problems should occur.  Please show the CHEMOTHERAPY ALERT CARD or IMMUNOTHERAPY ALERT CARD at check-in to the Emergency Department and triage nurse.  Should you have questions after your visit or need to cancel or reschedule your appointment, please contact [facility] [place] - A DEPT OF Eligha Bridegroom [facility] [phone]  and follow the prompts.  Office hours are 8:00 a.m. to 4:30 p.m. Monday - Friday. Please note that voicemails left after 4:00 p.m. may not be returned until the following business day.  We are closed weekends and major holidays. You have access to a nurse at all times for urgent questions. Please call the main number to the clinic [phone] and follow the prompts.  For any non-urgent questions, you may also contact your provider using MyChart. We now offer e-Visits for anyone 80 and older to request care online for non-urgent symptoms. For details visit [email or web address].   Also download the MyChart app! Go to the app store, search "MyChart", open the app, select [place], and log in with your MyChart username and password.

## 2024-02-28 NOTE — Progress Notes (Signed)
 Patient tolerated iron infusion with no complaints voiced.  Peripheral IV site clean and dry with good blood return noted before and after infusion.  Band aid applied.  VSS with discharge and left in satisfactory condition with no s/s of distress noted.

## 2024-03-02 ENCOUNTER — Inpatient Hospital Stay: Payer: MEDICAID

## 2024-03-05 ENCOUNTER — Other Ambulatory Visit: Payer: Self-pay | Admitting: Adult Health

## 2024-03-05 MED ORDER — VALACYCLOVIR HCL 1 G PO TABS: ORAL_TABLET | ORAL | 99 refills | Status: AC

## 2024-03-05 NOTE — Progress Notes (Signed)
 Refilled valtrex

## 2024-03-06 ENCOUNTER — Other Ambulatory Visit: Payer: Self-pay | Admitting: Family Medicine

## 2024-03-06 DIAGNOSIS — F445 Conversion disorder with seizures or convulsions: Secondary | ICD-10-CM

## 2024-03-08 ENCOUNTER — Inpatient Hospital Stay: Payer: MEDICAID | Attending: Physician Assistant

## 2024-03-14 ENCOUNTER — Other Ambulatory Visit: Payer: Self-pay | Admitting: Family Medicine

## 2024-03-14 DIAGNOSIS — F445 Conversion disorder with seizures or convulsions: Secondary | ICD-10-CM

## 2024-03-15 NOTE — Telephone Encounter (Signed)
 So previously I denied this prescription.  The reason it appears that she is already on Flexeril.  Therefore she does not need to be on 2 muscle relaxers.  Try to verify with patient which muscle relaxer is she using.  Also she should remember muscle relaxers should only be used at home but not with driving once you find out let me know thank you

## 2024-03-16 ENCOUNTER — Other Ambulatory Visit: Payer: Self-pay | Admitting: Family Medicine

## 2024-03-26 ENCOUNTER — Encounter: Payer: Self-pay | Admitting: Family Medicine

## 2024-03-27 ENCOUNTER — Other Ambulatory Visit: Payer: Self-pay | Admitting: Family Medicine

## 2024-03-27 DIAGNOSIS — F445 Conversion disorder with seizures or convulsions: Secondary | ICD-10-CM

## 2024-03-27 NOTE — Telephone Encounter (Signed)
 Nurses Unfortunately my schedule is quite full because of some prescheduled time out that I have If Victoire would like to see Jearlean Mince we could get her in sooner  As for a back specialist Dr. Rexanne Catalina with [facility] would be a good consideration but it could take some time to get in with them.  If she would like to have a referral for low back pain and radiculopathy that would be fine.  Also if she would like to be seen by [facility] in the near future that would be fine-she can make that choice  Thanks-Dr. Geralyn Knee

## 2024-03-28 ENCOUNTER — Other Ambulatory Visit: Payer: Self-pay

## 2024-03-28 DIAGNOSIS — M5410 Radiculopathy, site unspecified: Secondary | ICD-10-CM

## 2024-03-28 DIAGNOSIS — M545 Low back pain, unspecified: Secondary | ICD-10-CM

## 2024-03-29 ENCOUNTER — Other Ambulatory Visit: Payer: Self-pay

## 2024-03-29 DIAGNOSIS — M5432 Sciatica, left side: Secondary | ICD-10-CM

## 2024-03-29 NOTE — Telephone Encounter (Signed)
 Nurses-may have referral to [facility] Thanks

## 2024-04-01 ENCOUNTER — Other Ambulatory Visit: Payer: Self-pay | Admitting: Family Medicine

## 2024-04-01 DIAGNOSIS — G8929 Other chronic pain: Secondary | ICD-10-CM

## 2024-04-06 ENCOUNTER — Inpatient Hospital Stay: Payer: MEDICAID | Attending: Physician Assistant

## 2024-04-06 VITALS — BP 109/86 | HR 101 | Temp 98.2°F | Resp 20

## 2024-04-06 DIAGNOSIS — D500 Iron deficiency anemia secondary to blood loss (chronic): Secondary | ICD-10-CM

## 2024-04-06 DIAGNOSIS — D509 Iron deficiency anemia, unspecified: Secondary | ICD-10-CM | POA: Diagnosis not present

## 2024-04-06 DIAGNOSIS — E538 Deficiency of other specified B group vitamins: Secondary | ICD-10-CM | POA: Diagnosis present

## 2024-04-06 MED ORDER — CYANOCOBALAMIN 1000 MCG/ML IJ SOLN
1000.0000 ug | Freq: Once | INTRAMUSCULAR | Status: AC
Start: 2024-04-06 — End: 2024-04-06
  Administered 2024-04-06: 1000 ug via INTRAMUSCULAR
  Filled 2024-04-06: qty 1

## 2024-04-06 NOTE — Progress Notes (Signed)
 Penny [name] presents today for B12 injection per the provider's orders.  Stable during administration without incident; injection site WNL; see MAR for injection details.  Patient tolerated procedure well and without incident.  No questions or complaints noted at this time.

## 2024-04-06 NOTE — Patient Instructions (Signed)
 [facility] [place] - [facility]  Discharge Instructions: Thank you for choosing [facility] to provide your oncology and hematology care.  If you have a lab appointment with the Cancer Center - please note that after April 8th, 2024, all labs will be drawn in the cancer center.  You do not have to check in or register with the main entrance as you have in the past but will complete your check-in in the cancer center.  Wear comfortable clothing and clothing appropriate for easy access to any Portacath or PICC line.   We strive to give you quality time with your provider. You may need to reschedule your appointment if you arrive late (15 or more minutes).  Arriving late affects you and other patients whose appointments are after yours.  Also, if you miss three or more appointments without notifying the office, you may be dismissed from the clinic at the provider's discretion.      For prescription refill requests, have your pharmacy contact our office and allow 72 hours for refills to be completed.    Today you received the following chemotherapy and/or immunotherapy agents B12      To help prevent nausea and vomiting after your treatment, we encourage you to take your nausea medication as directed.  BELOW ARE SYMPTOMS THAT SHOULD BE REPORTED IMMEDIATELY: *FEVER GREATER THAN 100.4 F (38 C) OR HIGHER *CHILLS OR SWEATING *NAUSEA AND VOMITING THAT IS NOT CONTROLLED WITH YOUR NAUSEA MEDICATION *UNUSUAL SHORTNESS OF BREATH *UNUSUAL BRUISING OR BLEEDING *URINARY PROBLEMS (pain or burning when urinating, or frequent urination) *BOWEL PROBLEMS (unusual diarrhea, constipation, pain near the anus) TENDERNESS IN MOUTH AND THROAT WITH OR WITHOUT PRESENCE OF ULCERS (sore throat, sores in mouth, or a toothache) UNUSUAL RASH, SWELLING OR PAIN  UNUSUAL VAGINAL DISCHARGE OR ITCHING   Items with * indicate a potential emergency and should be followed up as  soon as possible or go to the Emergency Department if any problems should occur.  Please show the CHEMOTHERAPY ALERT CARD or IMMUNOTHERAPY ALERT CARD at check-in to the Emergency Department and triage nurse.  Should you have questions after your visit or need to cancel or reschedule your appointment, please contact [facility] [place] - A DEPT OF [facility] Bridegroom [facility] [phone]  and follow the prompts.  Office hours are 8:00 a.m. to 4:30 p.m. Monday - Friday. Please note that voicemails left after 4:00 p.m. may not be returned until the following business day.  We are closed weekends and major holidays. You have access to a nurse at all times for urgent questions. Please call the main number to the clinic [phone] and follow the prompts.  For any non-urgent questions, you may also contact your provider using MyChart. We now offer e-Visits for anyone 34 and older to request care online for non-urgent symptoms. For details visit [email or web address].   Also download the MyChart app! Go to the app store, search "MyChart", open the app, select [place], and log in with your MyChart username and password.

## 2024-04-09 ENCOUNTER — Other Ambulatory Visit: Payer: Self-pay | Admitting: Family Medicine

## 2024-04-09 DIAGNOSIS — F445 Conversion disorder with seizures or convulsions: Secondary | ICD-10-CM

## 2024-04-09 DIAGNOSIS — G8929 Other chronic pain: Secondary | ICD-10-CM

## 2024-04-11 ENCOUNTER — Telehealth: Payer: Self-pay | Admitting: Family Medicine

## 2024-04-11 ENCOUNTER — Other Ambulatory Visit: Payer: Self-pay

## 2024-04-11 DIAGNOSIS — F445 Conversion disorder with seizures or convulsions: Secondary | ICD-10-CM

## 2024-04-11 MED ORDER — TIZANIDINE HCL 2 MG PO TABS: 2.0000 mg | ORAL_TABLET | Freq: Three times a day (TID) | ORAL | 2 refills | Status: DC

## 2024-04-11 MED ORDER — TIZANIDINE HCL 2 MG PO TABS
2.0000 mg | ORAL_TABLET | Freq: Three times a day (TID) | ORAL | 0 refills | Status: DC
Start: 2024-04-11 — End: 2024-04-11

## 2024-04-11 NOTE — Telephone Encounter (Signed)
May have 2 refills 

## 2024-04-11 NOTE — Telephone Encounter (Signed)
 Refill on tiZANidine  (ZANAFLEX ) 2 MG tablet  [facility]

## 2024-04-14 ENCOUNTER — Encounter: Payer: Self-pay | Admitting: Family Medicine

## 2024-04-17 ENCOUNTER — Encounter: Payer: Self-pay | Admitting: Family Medicine

## 2024-04-17 ENCOUNTER — Other Ambulatory Visit: Payer: Self-pay | Admitting: Nurse Practitioner

## 2024-04-17 MED ORDER — TIZANIDINE HCL 4 MG PO TABS: 4.0000 mg | ORAL_TABLET | Freq: Three times a day (TID) | ORAL | 0 refills | Status: DC | PRN

## 2024-04-26 ENCOUNTER — Ambulatory Visit: Payer: MEDICAID | Admitting: Family Medicine

## 2024-04-26 ENCOUNTER — Encounter: Payer: Self-pay | Admitting: Family Medicine

## 2024-04-26 DIAGNOSIS — M545 Low back pain, unspecified: Secondary | ICD-10-CM

## 2024-04-26 DIAGNOSIS — M5432 Sciatica, left side: Secondary | ICD-10-CM

## 2024-04-26 DIAGNOSIS — R7989 Other specified abnormal findings of blood chemistry: Secondary | ICD-10-CM | POA: Diagnosis not present

## 2024-04-26 MED ORDER — ONDANSETRON 8 MG PO TBDP
8.0000 mg | ORAL_TABLET | Freq: Three times a day (TID) | ORAL | 6 refills | Status: AC | PRN
Start: 2024-04-26 — End: ?

## 2024-04-26 MED ORDER — CYCLOBENZAPRINE HCL 10 MG PO TABS: ORAL_TABLET | ORAL | 3 refills | Status: DC

## 2024-04-26 NOTE — Progress Notes (Signed)
   Subjective:    Patient ID: Penny [name], female    DOB: February 28, 1986, 38 y.o.   MRN: [id number]  HPI  Follow up - pt states is in a lot of pain in her back, she does have an upcoming appt with neurosurgery  Patient doing fair-she has severe pain and discomfort in her back radiates down the legs.  Makes it miserable for her day-to-day Patient in the past was on pain medicine because of some medication related issues she worked hard at getting off of pain medicines.  Certainly would benefit from pain medicine but at the same time increases the risk of addiction therefore she would like to tackle this by injections physical therapy massage etc.  Patient has extensive history of depression anxiety pseudoseizures as well as chronic back pain and chronic pain syndrome.  In my opinion she is not capable of holding a job and it would not be possible for her job to accommodate her physical disabilities.     Review of Systems     Objective:   Physical Exam  Lungs clear heart regular subjective tenderness in her back      Assessment & Plan:   Mental health-stable currently you follow through with her psychiatrist on a regular basis Chronic back pain with severe pain syndrome-seeing neurosurgery in the near future Patient would like to try Flexeril  she states in the past that helped her without causing drowsiness she denies abusing it states she takes 1 or 2/day.  Patient was told to stop tizanidine .  We sent in the prescription for Flexeril  with the instruction 1 twice daily as needed caution drowsiness no driving if feeling drowsy follow-up later this year  Lab work ordered because of previous electrolyte issues and patient also relates feeling very fatigued tired in the morning will check cortisol

## 2024-05-03 ENCOUNTER — Inpatient Hospital Stay (HOSPITAL_BASED_OUTPATIENT_CLINIC_OR_DEPARTMENT_OTHER): Payer: MEDICAID | Admitting: Oncology

## 2024-05-03 ENCOUNTER — Ambulatory Visit: Payer: Self-pay | Admitting: Family Medicine

## 2024-05-03 DIAGNOSIS — Z9884 Bariatric surgery status: Secondary | ICD-10-CM

## 2024-05-03 DIAGNOSIS — D500 Iron deficiency anemia secondary to blood loss (chronic): Secondary | ICD-10-CM

## 2024-05-03 DIAGNOSIS — M797 Fibromyalgia: Secondary | ICD-10-CM

## 2024-05-03 DIAGNOSIS — G43719 Chronic migraine without aura, intractable, without status migrainosus: Secondary | ICD-10-CM

## 2024-05-03 DIAGNOSIS — E538 Deficiency of other specified B group vitamins: Secondary | ICD-10-CM | POA: Diagnosis not present

## 2024-05-03 LAB — CBC WITH DIFFERENTIAL/PLATELET
Abs Immature Granulocytes: 0.01 10*3/uL (ref 0.00–0.07)
Basophils Absolute: 0 10*3/uL (ref 0.0–0.1)
Basophils Relative: 1 %
Eosinophils Absolute: 0.1 10*3/uL (ref 0.0–0.5)
Eosinophils Relative: 2 %
HCT: 39.3 % (ref 36.0–46.0)
Hemoglobin: 13.3 g/dL (ref 12.0–15.0)
Immature Granulocytes: 0 %
Lymphocytes Relative: 33 %
Lymphs Abs: 1.4 10*3/uL (ref 0.7–4.0)
MCH: 32.1 pg (ref 26.0–34.0)
MCHC: 33.8 g/dL (ref 30.0–36.0)
MCV: 94.9 fL (ref 80.0–100.0)
Monocytes Absolute: 0.3 10*3/uL (ref 0.1–1.0)
Monocytes Relative: 8 %
Neutro Abs: 2.4 10*3/uL (ref 1.7–7.7)
Neutrophils Relative %: 56 %
Platelets: 228 10*3/uL (ref 150–400)
RBC: 4.14 MIL/uL (ref 3.87–5.11)
RDW: 11.9 % (ref 11.5–15.5)
WBC: 4.3 10*3/uL (ref 4.0–10.5)
nRBC: 0 % (ref 0.0–0.2)

## 2024-05-03 LAB — BASIC METABOLIC PANEL WITH GFR
Anion gap: 9 (ref 5–15)
BUN: 13 mg/dL (ref 6–20)
CO2: 24 mmol/L (ref 22–32)
Calcium: 9.5 mg/dL (ref 8.9–10.3)
Chloride: 104 mmol/L (ref 98–111)
Creatinine, Ser: 0.55 mg/dL (ref 0.44–1.00)
GFR, Estimated: >60 mL/min (ref >60)
Glucose, Bld: 90 mg/dL (ref 70–99)
Potassium: 4.2 mmol/L (ref 3.5–5.1)
Sodium: 137 mmol/L (ref 135–145)

## 2024-05-03 LAB — IRON AND TIBC
Iron: 138 ug/dL (ref 28–170)
Saturation Ratios: 49 % — ABNORMAL HIGH (ref 10.4–31.8)
TIBC: 284 ug/dL (ref 250–450)
UIBC: 146 ug/dL

## 2024-05-03 LAB — FERRITIN: Ferritin: 169 ng/mL (ref 11–307)

## 2024-05-03 LAB — VITAMIN B12: Vitamin B-12: 357 pg/mL (ref 180–914)

## 2024-05-03 LAB — CORTISOL: Cortisol, Plasma: 8.3 ug/dL

## 2024-05-06 LAB — METHYLMALONIC ACID, SERUM: Methylmalonic Acid, Quantitative: 141 nmol/L (ref 0–378)

## 2024-05-08 NOTE — Progress Notes (Signed)
 Labs encounter

## 2024-05-10 ENCOUNTER — Other Ambulatory Visit (HOSPITAL_COMMUNITY): Payer: Self-pay | Admitting: Neurosurgery

## 2024-05-10 ENCOUNTER — Inpatient Hospital Stay: Payer: MEDICAID | Admitting: Oncology

## 2024-05-10 ENCOUNTER — Inpatient Hospital Stay: Payer: MEDICAID

## 2024-05-10 DIAGNOSIS — S22000A Wedge compression fracture of unspecified thoracic vertebra, initial encounter for closed fracture: Secondary | ICD-10-CM

## 2024-05-11 ENCOUNTER — Inpatient Hospital Stay: Payer: MEDICAID

## 2024-05-11 ENCOUNTER — Inpatient Hospital Stay: Payer: MEDICAID | Attending: Physician Assistant | Admitting: Oncology

## 2024-05-11 VITALS — BP 130/88 | HR 126 | Temp 98.8°F | Resp 18 | Wt 164.9 lb

## 2024-05-11 DIAGNOSIS — G43719 Chronic migraine without aura, intractable, without status migrainosus: Secondary | ICD-10-CM | POA: Diagnosis not present

## 2024-05-11 DIAGNOSIS — D500 Iron deficiency anemia secondary to blood loss (chronic): Secondary | ICD-10-CM

## 2024-05-11 DIAGNOSIS — E538 Deficiency of other specified B group vitamins: Secondary | ICD-10-CM | POA: Diagnosis present

## 2024-05-11 DIAGNOSIS — M797 Fibromyalgia: Secondary | ICD-10-CM | POA: Diagnosis not present

## 2024-05-11 DIAGNOSIS — Z9884 Bariatric surgery status: Secondary | ICD-10-CM

## 2024-05-11 MED ORDER — CYANOCOBALAMIN 1000 MCG/ML IJ SOLN: INTRAMUSCULAR | 10 refills | Status: DC

## 2024-05-11 MED ORDER — BD SYRINGE/NEEDLE 25G X 5/8" 3 ML MISC: 1.0000 | 0 refills | Status: DC

## 2024-05-11 MED ORDER — CYANOCOBALAMIN 1000 MCG/ML IJ SOLN
1000.0000 ug | Freq: Once | INTRAMUSCULAR | Status: AC
  Administered 2024-05-11: 1000 ug via INTRAMUSCULAR
  Filled 2024-05-11: qty 1

## 2024-05-11 NOTE — Progress Notes (Signed)
 Patient tolerated injection with no complaints voiced.  Site clean and dry with no bruising or swelling noted at site.  See MAR for details.  Band aid applied.  Patient stable during and after injection.  Vss with discharge and left in satisfactory condition with no s/s of distress noted.

## 2024-05-11 NOTE — Patient Instructions (Signed)

## 2024-05-11 NOTE — Progress Notes (Signed)
 [facility] [address] [place], [place] [address] Main: [phone] Fax: [phone]  Follow-up Visit    Patient Care Team: Bennet Brasil, MD as PCP - General (Family Medicine) Loyde Rule, MD as PCP - Cardiology (Cardiology) Alyce Jubilee, MD (Inactive) (Gastroenterology) Paulett Boros, MD as Medical Oncologist (Hematology) Patel, Donika K, DO as Consulting Physician (Neurology)   Name of the [name]  [id number]  1986/05/21   Date of visit: 05/11/2024   History of Present Illness: Patient is a 38 year old female who is followed by hematology for iron  deficiency anemia.  She last received IV Feraheme  on 02/13/24, 02/21/24 and 02/28/24.  She has been receiving monthly B12 injections.  Previously had side effects to IV iron  including syncope, seeing spots, headache and nausea but tolerated most recent infusion well.  Reports she would like to not get Compazine  with her next infusion and possibly get Zofran  instead.  Does not have a menstural cycle. Had an ablation after her daughter was born. No opiates. No blood that she she knows in her stools.   Reports she quit THC and alcohol about 90 days ago.  She is having to get fatigue palpitations and numbness of the hands and feet.  Appetite 50% energy levels are very low.  Denies any bleeding, melena, hematochezia or bright red blood per rectum.  Continues to have chronic low back pain does not take any medicine due to history of opiate abuse.  She sees a [facility].  Observations/Objective: Review of Systems  Constitutional:  Positive for malaise/fatigue.  Respiratory:  Positive for cough.   Cardiovascular:  Positive for palpitations.  Neurological:  Positive for tingling.    Physical Exam Constitutional:      Appearance: Normal appearance.  Cardiovascular:     Rate and Rhythm: Normal rate and regular rhythm.  Pulmonary:     Effort: Pulmonary effort is normal.      Breath sounds: Normal breath sounds.  Abdominal:     General: Bowel sounds are normal.     Palpations: Abdomen is soft.  Musculoskeletal:        General: No swelling. Normal range of motion.  Neurological:     Mental Status: She is alert and oriented to person, place, and time. Mental status is at baseline.    Assessment and Plan: 1. 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  -She received 3 doses of IV Feraheme  on 02/13/2024, 02/21/2024 and 02/28/2024.  Tolerated them well. -Reports her appetite is below although she is maintaining her weight. -She denies any bleeding.  Does not have menstrual cycles. -Labs from 05/03/2024 show an unremarkable CMP.  Iron  labs show a ferritin of 169, iron  saturation 49% with a normal TIBC.  Hemoglobin is 13.3. - No additional IV iron  needed at this time. -Return to clinic in approximately 3 months with labs a few days before and telephone visit.  2.  B12 deficiency: -Most recent B12 level is 357 (745). -She has been receiving monthly B12 since October 2024. -We discussed increasing B12 injections to weekly x 4, every other week x 2 followed by monthly injections. -She is due for a B12 injection today.  Will send additional prescriptions to her pharmacy.  Follow Up Instructions: -No additional IV iron  needed at this time. - She will start giving herself B12 injections at home.  We decided she will give herself weekly  B12 x 4, bimonthly x 2 followed by monthly.  Prescription sent to her pharmacy. -Return to clinic in 3 months with labs a few days before and telephone visit.   I discussed the assessment and treatment plan with the patient. The patient was provided an opportunity to ask questions and all were answered. The patient agreed with the plan and demonstrated an understanding of the instructions.   The patient was advised to call back or seek an in-person  evaluation if the symptoms worsen or if the condition fails to improve as anticipated.  I spent 25 minutes dedicated to the care of this patient (face-to-face and non-face-to-face) on the date of the encounter to include what is described in the assessment and plan.   Aurther Blue, NP

## 2024-05-22 ENCOUNTER — Other Ambulatory Visit: Payer: Self-pay | Admitting: Nurse Practitioner

## 2024-05-23 ENCOUNTER — Other Ambulatory Visit: Payer: Self-pay | Admitting: Nurse Practitioner

## 2024-05-25 ENCOUNTER — Other Ambulatory Visit (HOSPITAL_COMMUNITY): Payer: Self-pay | Admitting: Adult Health

## 2024-05-25 ENCOUNTER — Other Ambulatory Visit: Payer: Self-pay

## 2024-05-25 DIAGNOSIS — Z1231 Encounter for screening mammogram for malignant neoplasm of breast: Secondary | ICD-10-CM

## 2024-05-28 ENCOUNTER — Other Ambulatory Visit: Payer: Self-pay | Admitting: Family Medicine

## 2024-05-28 DIAGNOSIS — F445 Conversion disorder with seizures or convulsions: Secondary | ICD-10-CM

## 2024-05-29 ENCOUNTER — Other Ambulatory Visit: Payer: Self-pay | Admitting: Family Medicine

## 2024-05-29 NOTE — Telephone Encounter (Signed)
 Spoke with the patient she states she does not currently have either one of the muscle relaxers and is made aware that one of these will be called in to the pharmacy

## 2024-05-29 NOTE — Telephone Encounter (Signed)
 Please connect with patient-please have patient let us  know which muscle relaxer she intends to stay with Flexeril  or tizanidine  cannot prescribe both

## 2024-05-31 ENCOUNTER — Ambulatory Visit (HOSPITAL_COMMUNITY)
Admission: RE | Admit: 2024-05-31 | Discharge: 2024-05-31 | Disposition: A | Discharge status: Home / Self Care | Payer: MEDICAID | Source: Ambulatory Visit

## 2024-05-31 ENCOUNTER — Encounter (HOSPITAL_COMMUNITY): Payer: Self-pay

## 2024-05-31 DIAGNOSIS — Z1231 Encounter for screening mammogram for malignant neoplasm of breast: Secondary | ICD-10-CM | POA: Insufficient documentation

## 2024-06-04 ENCOUNTER — Encounter: Payer: Self-pay | Admitting: Family Medicine

## 2024-06-04 ENCOUNTER — Ambulatory Visit (HOSPITAL_COMMUNITY)
Admission: RE | Admit: 2024-06-04 | Discharge: 2024-06-04 | Disposition: A | Discharge status: Home / Self Care | Payer: MEDICAID | Source: Ambulatory Visit | Attending: Neurosurgery | Admitting: Neurosurgery

## 2024-06-04 ENCOUNTER — Other Ambulatory Visit (HOSPITAL_COMMUNITY): Payer: Self-pay | Admitting: Adult Health

## 2024-06-04 DIAGNOSIS — R928 Other abnormal and inconclusive findings on diagnostic imaging of breast: Secondary | ICD-10-CM

## 2024-06-04 DIAGNOSIS — S22000A Wedge compression fracture of unspecified thoracic vertebra, initial encounter for closed fracture: Secondary | ICD-10-CM | POA: Insufficient documentation

## 2024-06-05 ENCOUNTER — Ambulatory Visit: Payer: Self-pay | Admitting: Adult Health

## 2024-06-05 ENCOUNTER — Other Ambulatory Visit: Payer: Self-pay | Admitting: Family Medicine

## 2024-06-05 DIAGNOSIS — F445 Conversion disorder with seizures or convulsions: Secondary | ICD-10-CM

## 2024-06-05 MED ORDER — TIZANIDINE HCL 4 MG PO TABS: 4.0000 mg | ORAL_TABLET | Freq: Three times a day (TID) | ORAL | 2 refills | Status: DC | PRN

## 2024-06-12 ENCOUNTER — Other Ambulatory Visit (HOSPITAL_COMMUNITY): Payer: Self-pay | Admitting: Endocrinology

## 2024-06-12 DIAGNOSIS — M8580 Other specified disorders of bone density and structure, unspecified site: Secondary | ICD-10-CM

## 2024-06-13 ENCOUNTER — Telehealth: Payer: MEDICAID | Admitting: Physician Assistant

## 2024-06-13 DIAGNOSIS — R3989 Other symptoms and signs involving the genitourinary system: Secondary | ICD-10-CM

## 2024-06-13 MED ORDER — CEPHALEXIN 500 MG PO CAPS: 500.0000 mg | ORAL_CAPSULE | Freq: Two times a day (BID) | ORAL | 0 refills | Status: AC

## 2024-06-13 MED ORDER — FLUCONAZOLE 150 MG PO TABS: ORAL_TABLET | ORAL | 0 refills | Status: AC

## 2024-06-13 NOTE — Patient Instructions (Addendum)
 Penny [name], thank you for joining Penny Velma Lunger, PA-C for today's virtual visit.  While this provider is not your primary care provider (PCP), if your PCP is located in our provider database this encounter information will be shared with them immediately following your visit.   A [facility] MyChart account gives you access to today's visit and all your visits, tests, and labs performed at [facility]  click here if you don't have a [facility] MyChart account or go to [email or web address]  Consent: (Patient) Penny [name] provided verbal consent for this virtual visit at the beginning of the encounter.  Current Medications:  Current Outpatient Medications:    ADDERALL  XR 30 MG 24 hr capsule, TAKE 1 CAPSULE BY MOUTH ONCE EVERY MORNING., Disp: 30 capsule, Rfl: 0   azelastine (ASTELIN) 0.1 % nasal spray, spray 2 spray by intranasal route 2 times every day in each nostril, Disp: , Rfl:    budesonide -formoterol  (SYMBICORT ) 160-4.5 MCG/ACT inhaler, Inhale into the lungs., Disp: , Rfl:    Calcium  Carb-Cholecalciferol  (CALCIUM -VITAMIN D3) 600-500 MG-UNIT CAPS, Take 1 tablet by mouth daily., Disp: , Rfl:    cetirizine  (ZYRTEC ) 10 MG tablet, TAKE 1 TABLET BY MOUTH ONCE DAILY, Disp: 30 tablet, Rfl: 0   clonazePAM  (KLONOPIN ) 0.5 MG tablet, TAKE 1 TABLET BY MOUTH THREE TIMES DAILY AS NEEDED., Disp: 90 tablet, Rfl: 0   cloNIDine  (CATAPRES ) 0.1 MG tablet, Take 0.1 mg by mouth at bedtime., Disp: , Rfl:    cyanocobalamin  (VITAMIN B12) 1000 MCG/ML injection, 1 ml weekly X 4, then 1 ml every other week and then transition to monthly injection., Disp: 1 mL, Rfl: 10   DULoxetine  (CYMBALTA ) 60 MG capsule, Take 1 capsule (60 mg total) by mouth 2 (two) times daily., Disp: 180 capsule, Rfl: 2   Erenumab -aooe (AIMOVIG ) 140 MG/ML SOAJ, INJECT 1 PEN INTO THE SKIN ONCE EVERY 28 DAYS., Disp: 1 mL, Rfl: 11   fluconazole  (DIFLUCAN ) 150 MG tablet, Take one tablet today, if symptoms persist after 72  hours, may take second tablet., Disp: 2 tablet, Rfl: 0   ibuprofen  (ADVIL ) 800 MG tablet, Take 800 mg by mouth every 8 (eight) hours as needed., Disp: , Rfl:    levalbuterol  (XOPENEX  HFA) 45 MCG/ACT inhaler, INHALE (2) PUFFS INTO THE LUNGS EVERY SIX HOURS AS NEEDED FOR WHEEZING., Disp: 15 g, Rfl: 3   ondansetron  (ZOFRAN -ODT) 8 MG disintegrating tablet, Take 1 tablet (8 mg total) by mouth every 8 (eight) hours as needed., Disp: 20 tablet, Rfl: 6   pantoprazole  (PROTONIX ) 40 MG tablet, TAKE ONE TABLET BY MOUTH ONCE DAILY., Disp: 30 tablet, Rfl: 1   propranolol  (INDERAL ) 10 MG tablet, Take 10 mg by mouth 3 (three) times daily., Disp: , Rfl:    Rimegepant Sulfate (NURTEC) 75 MG TBDP, TAKE 1 TABLET BY MOUTH AS NEEDED. MAX TWICE WEEKLY., Disp: 8 tablet, Rfl: 11   risperiDONE (RISPERDAL) 0.25 MG tablet, Take 0.75 mg by mouth at bedtime., Disp: , Rfl:    sucralfate  (CARAFATE ) 1 g tablet, 1 g tid crush mix with 30 ml water  and take po tid, Disp: 90 tablet, Rfl: 0   SYRINGE-NEEDLE, DISP, 3 ML (B-D SYRINGE/NEEDLE 3CC/25GX5/8) 25G X 5/8 3 ML MISC, 1 each by Does not apply route once a week., Disp: 10 each, Rfl: 0   tiZANidine  (ZANAFLEX ) 4 MG tablet, Take 1 tablet (4 mg total) by mouth every 8 (eight) hours as needed for muscle spasms., Disp: 45 tablet, Rfl: 2   valACYclovir  (VALTREX ) 1000 MG  tablet, Take 1 bid for 10 days, Disp: 20 tablet, Rfl: PRN   Vitamin D , Ergocalciferol , (DRISDOL) 1.25 MG (50000 UNIT) CAPS capsule, Take 50,000 Units by mouth every Wednesday., Disp: , Rfl:    Medications ordered in this encounter:  No orders of the defined types were placed in this encounter.    *If you need refills on other medications prior to your next appointment, please contact your pharmacy*  Follow-Up: Call back or seek an in-person evaluation if the symptoms worsen or if the condition fails to improve as anticipated.  [facility] [phone]  Other Instructions Your symptoms are consistent  with a bladder infection, also called acute cystitis. Please take your antibiotic (Keflex ) as directed until all pills are gone.  Stay very well hydrated.  Consider a daily probiotic (Align, Culturelle, or Activia) to help prevent stomach upset caused by the antibiotic.  Taking a probiotic daily may also help prevent recurrent UTIs.  Also consider taking AZO (Phenazopyridine ) tablets to help decrease pain with urination. If you note any non-resolving, new, or worsening symptoms despite treatment, please seek an in-person evaluation ASAP.   Urinary Tract Infection A urinary tract infection (UTI) can occur any place along the urinary tract. The tract includes the kidneys, ureters, bladder, and urethra. A type of germ called bacteria often causes a UTI. UTIs are often helped with antibiotic medicine.  HOME CARE  If given, take antibiotics as told by your doctor. Finish them even if you start to feel better. Drink enough fluids to keep your pee (urine) clear or pale yellow. Avoid tea, drinks with caffeine , and bubbly (carbonated) drinks. Pee often. Avoid holding your pee in for a long time. Pee before and after having sex (intercourse). Wipe from front to back after you poop (bowel movement) if you are a woman. Use each tissue only once. GET HELP RIGHT AWAY IF:  You have back pain. You have lower belly (abdominal) pain. You have chills. You feel sick to your stomach (nauseous). You throw up (vomit). Your burning or discomfort with peeing does not go away. You have a fever. Your symptoms are not better in 3 days. MAKE SURE YOU:  Understand these instructions. Will watch your condition. Will get help right away if you are not doing well or get worse. Document Released: 05/10/2008 Document Revised: 08/16/2012 Document Reviewed: 06/22/2012 [facility] Patient Information 2015 [place], [place]. This information is not intended to replace advice given to you by your health care provider. Make sure you  discuss any questions you have with your health care provider.   Eating Plan for Interstitial Cystitis Interstitial cystitis (IC) is a long-term (chronic) condition that can cause pain and pressure near your bladder. It can also make you have to pee urgently and often. Symptoms may come and go. Certain foods may trigger your symptoms. Learning what foods bother you can help you come up with an eating plan to manage IC. What are tips for following this plan? You may want to work with an expert in healthy eating called a dietitian. They can help you make an eating plan by doing an elimination diet. This diet involves: Making a list of foods that you think trigger your symptoms. You may also want to include foods that often cause symptoms in other people with IC. It may take a few months to find out which foods bother you. Taking those foods out of your diet for about a month. After that month, you can try to have  the foods again one at a time to see which ones cause symptoms. Reading food labels Once you know which foods trigger your symptoms, you can avoid them. But it's still a good idea to read food labels. Some foods that cause your symptoms may be ingredients in other foods. These foods may include: Soy. Worcestershire sauce. Vinegar. Alcohol. Artificial sweeteners. Monosodium glutamate. Other triggers may include: Chili peppers. Tomato products. Citrus fruits, flavors, or juices. Shopping Shopping can be hard if many foods trigger your IC. Bring a list of the foods you can't eat with you when you go to the store. You can get an app for your phone that lets you know which foods are safer and which ones you may want to avoid. You can find the app at the [employer] website: ic-network.com Meal planning Plan your meals based on the results of your elimination diet. If you haven't done the diet yet, plan meals based on IC food lists. These lists may be given to you by your health care  provider or dietitian. The lists tell you which foods are least and most likely to cause symptoms. Avoid certain types of food when you go out to eat. These may include: Pizza. [place] food. [place] food. [place] food. General information Do not eat large portions. Drink lots of fluids with your meals. Do not eat foods that are high in sugar, salt, or saturated fat. Choose whole fruits instead of juice. Eat a colorful variety of vegetables. Find ways to manage stress. Get enough exercise. What foods should I eat? For people with IC, the best diet is a balanced one. This means it includes things from all the food groups. Even if you have to avoid certain foods, there are still lots of healthy choices in each group. Below are some foods that may be safest for you to eat: Fruits Bananas. Blueberries and blueberry juice. Melons. Pears. Apples. Dates. Prunes. Raisins. Apricots. Vegetables Asparagus. Avocado. Celery. Beets. Bell peppers. Black olives. Broccoli. Brussels sprouts. Cabbage. Carrots. Cauliflower. Cucumber. Eggplant. Green beans. Potatoes. Radishes. Spinach. Squash. Turnips. Zucchini. Mushrooms. Peas. Grains Oats. Rice. Bran. Oatmeal. Whole wheat bread. Meats and other proteins Beef. Fish and other seafood. Eggs. Nuts. Peanut butter. Pork. Poultry. Lamb. Garbanzo beans. Pinto beans. Dairy Whole or low-fat milk. American, mozzarella, mild cheddar, feta, ricotta, and cream cheeses. The items listed above may not be all the foods and drinks you can have. Talk to a dietitian to learn more. What foods should I avoid? You should avoid any foods that cause symptoms. It's also a good idea to avoid foods that often cause symptoms in many people with IC. These foods include: Fruits Citrus fruits, such as lemons, limes, oranges, and grapefruit. Cranberries. Strawberries. Pineapple. Kiwi. Vegetables Chili peppers. Onions. Sauerkraut. Tomato and tomato products. Dene. Grains You don't need  to avoid any type of grain unless it causes symptoms. Meats and other proteins Precooked or cured meats, such as sausages or meat loaves. Soy products. Dairy Chocolate ice cream. Processed cheese. Yogurt. Drinks Alcohol. Chocolate drinks. Coffee. Cranberry juice. Fizzy drinks. Black, green, or herbal tea. Tomato juice. Sports drinks. The items listed above may not be all the foods and drinks you should avoid. Talk to a dietitian to learn more. This information is not intended to replace advice given to you by your health care provider. Make sure you discuss any questions you have with your health care provider. Document Revised: 03/03/2023 Document Reviewed: 03/03/2023 Elsevier Patient Education  2024 [employer].  If you have been instructed to have an in-person evaluation today at a local Urgent Care facility, please use the link below. It will take you to a list of all of our available [facility], including address, phone number and hours of operation. Please do not delay care.  [facility]  If you or a family member do not have a primary care provider, use the link below to schedule a visit and establish care. When you choose a [facility] primary care physician or advanced practice provider, you gain a long-term partner in health. Find a Primary Care Provider  Learn more about 's in-office and virtual care options:  - Get Care Now

## 2024-06-13 NOTE — Progress Notes (Signed)
 Virtual Visit Consent   Penny [name], you are scheduled for a virtual visit with a [facility] provider today. Just as with appointments in the office, your consent must be obtained to participate. Your consent will be active for this visit and any virtual visit you may have with one of our providers in the next 365 days. If you have a MyChart account, a copy of this consent can be sent to you electronically.  As this is a virtual visit, video technology does not allow for your provider to perform a traditional examination. This may limit your provider's ability to fully assess your condition. If your provider identifies any concerns that need to be evaluated in person or the need to arrange testing (such as labs, EKG, etc.), we will make arrangements to do so. Although advances in technology are sophisticated, we cannot ensure that it will always work on either your end or our end. If the connection with a video visit is poor, the visit may have to be switched to a telephone visit. With either a video or telephone visit, we are not always able to ensure that we have a secure connection.  By engaging in this virtual visit, you consent to the provision of healthcare and authorize for your insurance to be billed (if applicable) for the services provided during this visit. Depending on your insurance coverage, you may receive a charge related to this service.  I need to obtain your verbal consent now. Are you willing to proceed with your visit today? Penny [name] has provided verbal consent on 06/13/2024 for a virtual visit (video or telephone). Penny [name], [place]  Date: 06/13/2024 2:21 PM   Virtual Visit via Video Note   I, Penny [name], connected with  Penny [name]  ([id number], 1986-10-18) on 06/13/24 at  2:15 PM EDT by a video-enabled telemedicine application and verified that I am speaking with the correct person using two identifiers.  Location: Patient: Virtual Visit Location  Patient: Home Provider: Virtual Visit Location Provider: Home Office   I discussed the limitations of evaluation and management by telemedicine and the availability of in person appointments. The patient expressed understanding and agreed to proceed.    History of Present Illness: Penny [name] is a 38 y.o. who identifies as a female who was assigned female at birth, and is being seen today for possible UTI. Endorses symptoms starting about 3 weeks ago very slight and intermittent with urgency and discomfort. Has history of CIIC so thought maybe related to that as it flares in the summer. Over the past 2 days noting increased dysuria, urgency, frequency. Denies fever, chills. Denies nausea/vomiting. Denies belly pain. Has chronic back pain -- no change since onset of these symptoms. Is s/p tubal ligation.  Denies concern for STI.   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   Absence of bladder continence 06/14/2022   Joint pain 04/01/2022   Chronic prescription opiate use 12/25/2020   Major depressive disorder, recurrent episode (HCC) 06/22/2020   Chronic abdominal pain 09/19/2019   Gastroesophageal reflux disease without esophagitis    Severe anxiety 08/21/2019   Urge incontinence 10/26/2018   S/P kyphoplasty 08/02/2018   Herpes genitalis in women 12/19/2017   Urinary tract infection with hematuria 03/30/2017  Family history of breast cancer in mother,uncertain BR CA status 02/28/2017   Chronic pain syndrome 12/05/2016   Folate deficiency 12/04/2016   Anastomotic ulcer 04/22/2016   Pain medication agreement signed 04/22/2016   Neuropathy 01/23/2016   Major depressive disorder, recurrent episode, 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HCC) 05/10/2015    Fibromyalgia 02/18/2015   Vulvar fissure 02/18/2015   Current smoker 01/29/2015   Current tobacco use 01/29/2015   Sciatica of left side 11/14/2014   Psychogenic nonepileptic seizure 09/12/2014   Hypertension in pregnancy, transient 07/08/2014   Previous gastric bypass affecting pregnancy, antepartum 05/22/2014   History of bariatric surgery 05/22/2014   Patent foramen ovale with right to left shunt 05/17/2014   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   Rapid palpitations 02/18/2014   Supervision of pregnancy with other poor reproductive or obstetric history, unspecified trimester 02/18/2014   Restless legs 01/16/2014   Restless leg 01/16/2014   Chronic interstitial cystitis 10/23/2013   Depression with anxiety 03/10/2012    Class: Chronic   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 03/10/2012    Class: Chronic   Attention deficit hyperactivity disorder, 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    Class: Chronic   Panic disorder without agoraphobia 03/10/2012   H/O disease 03/10/2012   Dysthymia 03/10/2012   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  Other Reaction(s): Fatigue, Mental Status Changes   Metoclopramide  Hcl Anxiety and Other (See Comments)    Pt states that she felt like she was trapped in a box, and could not get out.  Pt also states that she had temporary loss of  movement, weakness, and tingling.     Tramadol  Other (See Comments)    Seizures  Other Reaction(s): Seizures   Ativan  [Lorazepam ] Other (See Comments)    combative   Latex Itching, Rash and Other (See Comments)    Burns   Lyrica  [Pregabalin ] Other (See Comments)    suicidal   Iron  Other (See Comments)    PATIENT IS INTOLERANT TO ORAL IRON  , SHE DOES NOT ABSORB IT.     Ondansetron  Hcl     Other Reaction(s): MIgraines   Sulfa  Antibiotics Hives   Butrans  [Buprenorphine ] Rash    Patch caused rash, didn't help with pain   Nucynta [Tapentadol] Nausea And Vomiting   Propofol  Nausea And Vomiting   Tape Hives, Itching and Rash    Please use paper tape   Toradol  [Ketorolac  Tromethamine ] Anxiety   Medications:  Current Outpatient Medications:    cephALEXin  (KEFLEX ) 500 MG capsule, Take 1 capsule (500 mg total) by mouth 2 (two) times daily for 7 days., Disp: 14 capsule, Rfl: 0   fluconazole  (DIFLUCAN ) 150 MG tablet, Take 1 tablet PO once. Repeat in 3 days if needed., Disp: 2 tablet, Rfl: 0   ADDERALL  XR 30 MG 24 hr capsule, TAKE 1 CAPSULE BY MOUTH ONCE EVERY MORNING., Disp: 30 capsule, Rfl: 0   azelastine (ASTELIN) 0.1 % nasal spray, spray 2 spray by intranasal  route 2 times every day in each nostril, Disp: , Rfl:    budesonide -formoterol  (SYMBICORT ) 160-4.5 MCG/ACT inhaler, Inhale into the lungs., Disp: , Rfl:    Calcium  Carb-Cholecalciferol  (CALCIUM -VITAMIN D3) 600-500 MG-UNIT CAPS, Take 1 tablet by mouth daily., Disp: , Rfl:    cetirizine  (ZYRTEC ) 10 MG tablet, TAKE 1 TABLET BY MOUTH ONCE DAILY, Disp: 30 tablet, Rfl: 0   clonazePAM  (KLONOPIN ) 0.5 MG tablet, TAKE 1 TABLET BY MOUTH THREE TIMES DAILY AS NEEDED., Disp: 90 tablet, Rfl: 0   cloNIDine  (CATAPRES ) 0.1 MG tablet, Take 0.1 mg by mouth at bedtime., Disp: , Rfl:    cyanocobalamin  (VITAMIN B12) 1000 MCG/ML injection, 1 ml weekly X 4, then 1 ml every other week and then transition to monthly injection., Disp: 1 mL, Rfl: 10   DULoxetine   (CYMBALTA ) 60 MG capsule, Take 1 capsule (60 mg total) by mouth 2 (two) times daily., Disp: 180 capsule, Rfl: 2   Erenumab -aooe (AIMOVIG ) 140 MG/ML SOAJ, INJECT 1 PEN INTO THE SKIN ONCE EVERY 28 DAYS., Disp: 1 mL, Rfl: 11   ibuprofen  (ADVIL ) 800 MG tablet, Take 800 mg by mouth every 8 (eight) hours as needed., Disp: , Rfl:    levalbuterol  (XOPENEX  HFA) 45 MCG/ACT inhaler, INHALE (2) PUFFS INTO THE LUNGS EVERY SIX HOURS AS NEEDED FOR WHEEZING., Disp: 15 g, Rfl: 3   ondansetron  (ZOFRAN -ODT) 8 MG disintegrating tablet, Take 1 tablet (8 mg total) by mouth every 8 (eight) hours as needed., Disp: 20 tablet, Rfl: 6   pantoprazole  (PROTONIX ) 40 MG tablet, TAKE ONE TABLET BY MOUTH ONCE DAILY., Disp: 30 tablet, Rfl: 1   propranolol  (INDERAL ) 10 MG tablet, Take 10 mg by mouth 3 (three) times daily., Disp: , Rfl:    Rimegepant Sulfate (NURTEC) 75 MG TBDP, TAKE 1 TABLET BY MOUTH AS NEEDED. MAX TWICE WEEKLY., Disp: 8 tablet, Rfl: 11   risperiDONE (RISPERDAL) 0.25 MG tablet, Take 0.75 mg by mouth at bedtime., Disp: , Rfl:    sucralfate  (CARAFATE ) 1 g tablet, 1 g tid crush mix with 30 ml water  and take po tid, Disp: 90 tablet, Rfl: 0   SYRINGE-NEEDLE, DISP, 3 ML (B-D SYRINGE/NEEDLE 3CC/25GX5/8) 25G X 5/8 3 ML MISC, 1 each by Does not apply route once a week., Disp: 10 each, Rfl: 0   tiZANidine  (ZANAFLEX ) 4 MG tablet, Take 1 tablet (4 mg total) by mouth every 8 (eight) hours as needed for muscle spasms., Disp: 45 tablet, Rfl: 2   valACYclovir  (VALTREX ) 1000 MG tablet, Take 1 bid for 10 days, Disp: 20 tablet, Rfl: PRN   Vitamin D , Ergocalciferol , (DRISDOL) 1.25 MG (50000 UNIT) CAPS capsule, Take 50,000 Units by mouth every Wednesday., Disp: , Rfl:   Observations/Objective: Patient is well-developed, well-nourished in no acute distress.  Resting comfortably  at home.  Head is normocephalic, atraumatic.  No labored breathing.  Speech is clear and coherent with logical content.  Patient is alert and oriented at  baseline.   Assessment and Plan: 1. Suspected UTI (Primary) - cephALEXin  (KEFLEX ) 500 MG capsule; Take 1 capsule (500 mg total) by mouth 2 (two) times daily for 7 days.  Dispense: 14 capsule; Refill: 0  Classic UTI symptoms with absence of alarm signs or symptoms. Prior history of UTI. Will treat empirically with Keflex  for suspected uncomplicated cystitis. Supportive measures and OTC medications reviewed. Placed Diflucan  on file in case of antibiotic-induced yeast. Strict in-person evaluation precautions discussed.    Follow Up Instructions: I discussed the assessment and treatment plan with the  patient. The patient was provided an opportunity to ask questions and all were answered. The patient agreed with the plan and demonstrated an understanding of the instructions.  A copy of instructions were sent to the patient via MyChart unless otherwise noted below.   The patient was advised to call back or seek an in-person evaluation if the symptoms worsen or if the condition fails to improve as anticipated.    Penny Velma Lunger, PA-C

## 2024-06-14 ENCOUNTER — Other Ambulatory Visit: Payer: Self-pay

## 2024-06-14 ENCOUNTER — Ambulatory Visit (HOSPITAL_COMMUNITY)
Admission: RE | Admit: 2024-06-14 | Discharge: 2024-06-14 | Disposition: A | Discharge status: Home / Self Care | Payer: MEDICAID | Source: Ambulatory Visit | Attending: Adult Health | Admitting: Adult Health

## 2024-06-14 ENCOUNTER — Encounter (HOSPITAL_COMMUNITY): Payer: Self-pay

## 2024-06-14 ENCOUNTER — Encounter: Payer: Self-pay | Admitting: Neurology

## 2024-06-14 DIAGNOSIS — R202 Paresthesia of skin: Secondary | ICD-10-CM

## 2024-06-14 DIAGNOSIS — R928 Other abnormal and inconclusive findings on diagnostic imaging of breast: Secondary | ICD-10-CM | POA: Diagnosis present

## 2024-06-15 ENCOUNTER — Ambulatory Visit: Payer: Self-pay | Admitting: Adult Health

## 2024-06-22 ENCOUNTER — Ambulatory Visit (HOSPITAL_COMMUNITY)
Admission: RE | Admit: 2024-06-22 | Discharge: 2024-06-22 | Disposition: A | Discharge status: Home / Self Care | Payer: MEDICAID | Source: Ambulatory Visit | Attending: Endocrinology | Admitting: Endocrinology

## 2024-06-22 DIAGNOSIS — M8580 Other specified disorders of bone density and structure, unspecified site: Secondary | ICD-10-CM | POA: Insufficient documentation

## 2024-06-26 NOTE — Addendum Note (Signed)
 Addended by: GLADIS ELSIE BROCKS on: 06/26/2024 09:37 AM   Modules accepted: Level of Service

## 2024-07-09 ENCOUNTER — Other Ambulatory Visit: Payer: Self-pay | Admitting: Adult Health

## 2024-07-09 MED ORDER — NITROFURANTOIN MONOHYD MACRO 100 MG PO CAPS: 100.0000 mg | ORAL_CAPSULE | Freq: Two times a day (BID) | ORAL | 0 refills | Status: AC

## 2024-07-09 NOTE — Progress Notes (Signed)
Will rx macrobid  ?

## 2024-07-20 ENCOUNTER — Ambulatory Visit (INDEPENDENT_AMBULATORY_CARE_PROVIDER_SITE_OTHER): Payer: MEDICAID | Admitting: Neurology

## 2024-07-20 DIAGNOSIS — G608 Other hereditary and idiopathic neuropathies: Secondary | ICD-10-CM

## 2024-07-20 DIAGNOSIS — R202 Paresthesia of skin: Secondary | ICD-10-CM

## 2024-07-20 NOTE — Procedures (Signed)
 [facility]  [address] 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  NCV & EMG Findings: Extensive electrodiagnostic testing of the right lower extremity and additional studies of the left shows:  Bilateral sural and superficial peroneal sensory responses are absent. Bilateral peroneal motor responses are absent at the extensor digitorum brevis.  Bilateral peroneal (tibialis anterior) motor responses show prolonged latency and reduced amplitude.  Bilateral tibial motor responses show reduced amplitudes.  Peroneal and tibial motor conduction velocities are within normal limits. Bilateral tibial H reflex studies are absent. Chronic motor axon loss changes are seen affecting the muscles below the knee bilaterally, with without accompanying active denervation.  Compared to prior study, motor unit recruitment has improved and there is no evidence of fibrillation potentials.  Impression: The electrophysiologic findings are most consistent with a severe and chronic sensorimotor polyneuropathy, with axonal and demyelinating features.  Compared to prior study on 12/11/2015, there is mild interval improvement.   There is no evidence of lumbosacral radiculopathy affecting lower extremities.   ___________________________ Tonita Blanch, DO    Nerve Conduction Studies   Stim Site NR Peak (ms) Norm Peak (ms) O-P Amp (V) Norm O-P Amp  Left Sup Peroneal Anti Sensory (Ant Lat Mall)  32 C  12 cm *NR  <4.5  >5  Right Sup Peroneal Anti Sensory (Ant Lat Mall)  32 C  12 cm *NR  <4.5  >5  Left Sural Anti Sensory (Lat  Mall)  32 C  Calf *NR  <4.5  >5  Right Sural Anti Sensory (Lat Mall)  32 C  Calf *NR  <4.5  >5     Stim Site NR Onset (ms) Norm Onset (ms) O-P Amp (mV) Norm O-P Amp Site1 Site2 Delta-0 (ms) Dist (cm) Vel (m/s) Norm Vel (m/s)  Left Peroneal Motor (Ext Dig Brev)  32 C  Ankle *NR  <5.5  >3 B Fib Ankle  0.0  >40  B Fib *NR     Poplt B Fib  0.0  >40  Poplt *NR            Right Peroneal Motor (Ext Dig Brev)  32 C  Ankle *NR  <5.5  >3 B Fib Ankle  0.0  >40  B Fib *NR     Poplt B Fib  0.0  >40  Poplt *NR            Left Peroneal TA Motor (Tib Ant)  32 C  Fib Head    *4.6 <4.0 *3.3 >4 Poplit Fib Head 1.3 8.0 62 >40  Poplit    *5.9 <5.7 3.3         Right Peroneal TA Motor (Tib Ant)  32 C  Fib Head    *5.0 <4.0 *3.9 >4 Poplit Fib Head 1.4 8.0 57 >40  Poplit    *6.4 <5.7 3.7         Left Tibial Motor (Abd Hall Brev)  32 C  Ankle    6.0 <6.0 *4.0 >8 Knee Ankle 10.1 45.0 45 >40  Knee    17.2  2.7         Right  Tibial Motor (Abd Hall Brev)  32 C  Ankle    3.9 <6.0 *2.9 >8 Knee Ankle 10.6 45.0 42 >40  Knee    14.5  1.3          Electromyography   Side Muscle Ins.Act Fibs Fasc Recrt Amp Dur Poly Activation Comment  Right AntTibialis Nml Nml Nml *2- *1+ *1+ *1+ Nml N/A  Right Gastroc Nml Nml Nml *2- *1+ *1+ *1+ Nml N/A  Right Flex Dig Long Nml Nml Nml *2- *1+ *1+ *1+ Nml N/A  Right RectFemoris Nml Nml Nml Nml Nml Nml Nml Nml N/A  Right BicepsFemS Nml Nml Nml Nml Nml Nml Nml Nml N/A  Right GluteusMed Nml Nml Nml Nml Nml Nml Nml Nml N/A  Left AntTibialis Nml Nml Nml *2- *1+ *1+ *1+ Nml N/A  Left Gastroc Nml Nml Nml *2- *1+ *1+ *1+ Nml N/A  Left RectFemoris Nml Nml Nml Nml Nml Nml Nml Nml N/A  Left GluteusMed Nml Nml Nml Nml Nml Nml Nml Nml N/A  Left Flex Dig Long Nml Nml Nml *2- *1+ *1+ *1+ Nml N/A  Left BicepsFemS Nml Nml Nml Nml Nml Nml Nml Nml N/A      Waveforms:

## 2024-07-23 ENCOUNTER — Other Ambulatory Visit: Payer: Self-pay | Admitting: Family Medicine

## 2024-07-23 DIAGNOSIS — F445 Conversion disorder with seizures or convulsions: Secondary | ICD-10-CM

## 2024-07-30 ENCOUNTER — Other Ambulatory Visit: Payer: Self-pay | Admitting: Family Medicine

## 2024-07-30 DIAGNOSIS — F445 Conversion disorder with seizures or convulsions: Secondary | ICD-10-CM

## 2024-07-31 ENCOUNTER — Encounter: Payer: Self-pay | Admitting: Neurology

## 2024-07-31 ENCOUNTER — Ambulatory Visit (INDEPENDENT_AMBULATORY_CARE_PROVIDER_SITE_OTHER): Payer: MEDICAID | Admitting: Neurology

## 2024-07-31 ENCOUNTER — Other Ambulatory Visit: Payer: Self-pay | Admitting: Family Medicine

## 2024-07-31 VITALS — BP 123/82 | HR 97 | Ht 66.0 in | Wt 171.0 lb

## 2024-07-31 DIAGNOSIS — G43709 Chronic migraine without aura, not intractable, without status migrainosus: Secondary | ICD-10-CM

## 2024-07-31 DIAGNOSIS — G608 Other hereditary and idiopathic neuropathies: Secondary | ICD-10-CM

## 2024-07-31 DIAGNOSIS — F445 Conversion disorder with seizures or convulsions: Secondary | ICD-10-CM

## 2024-07-31 MED ORDER — RIZATRIPTAN BENZOATE 5 MG PO TABS: 5.0000 mg | ORAL_TABLET | ORAL | 5 refills | Status: AC | PRN

## 2024-07-31 MED ORDER — TOPIRAMATE 25 MG PO TABS
25.0000 mg | ORAL_TABLET | Freq: Every day | ORAL | 5 refills | Status: AC
Start: 2024-07-31 — End: ?

## 2024-07-31 NOTE — Progress Notes (Signed)
 Follow-up Visit   Date: 07/31/24   Penny [name] MRN: [id number] DOB: Nov 15, 1986   Interim History: Penny [name] is a 38 y.o. 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    She reports having stabbing headaches over the past month, which occurs 1-2 times and lasts several hours.   She has been taking Nurtec 1-2 times per week, but does not get relief as much as previously.  She is compliant with Aimovig  140mg  monthly injections.  She feels that the neuropathy in her feet is getting worse and she is more aware of the numbness in the legs.  She also feels intermittent weakness of the left leg.   Medications:  Current Outpatient Medications on File Prior to Visit  Medication Sig Dispense Refill   ADDERALL  XR 30 MG 24 hr capsule TAKE 1 CAPSULE BY MOUTH ONCE EVERY MORNING. 30 capsule 0   azelastine (ASTELIN) 0.1 % nasal spray spray 2 spray by intranasal route 2 times every day in each nostril     budesonide -formoterol  (SYMBICORT ) 160-4.5 MCG/ACT inhaler Inhale into the lungs.     Calcium  Carb-Cholecalciferol  (CALCIUM -VITAMIN D3) 600-500 MG-UNIT CAPS Take 1 tablet by mouth daily.     cetirizine  (ZYRTEC ) 10 MG tablet TAKE 1 TABLET BY MOUTH ONCE DAILY 30 tablet 0   clonazePAM  (KLONOPIN ) 0.5 MG tablet TAKE 1 TABLET BY MOUTH THREE TIMES DAILY AS NEEDED. 90 tablet 0   cloNIDine  (CATAPRES ) 0.1 MG tablet Take 0.1 mg by mouth at bedtime.     Cyanocobalamin  (B-12 COMPLIANCE INJECTION IJ) Inject as directed.     cyanocobalamin  (VITAMIN B12) 1000 MCG/ML injection 1 ml weekly X 4, then 1 ml every other week and then transition to monthly injection. 1 mL 10   DULoxetine  (CYMBALTA ) 60 MG capsule Take 1 capsule (60 mg total) by mouth 2 (two) times daily. 180 capsule 2   Erenumab -aooe (AIMOVIG ) 140 MG/ML SOAJ INJECT 1 PEN INTO  THE SKIN ONCE EVERY 28 DAYS. 1 mL 11   Ergocalciferol  (VITAMIN D2 PO) Take by mouth.     fluconazole  (DIFLUCAN ) 150 MG tablet Take 1 tablet PO once. Repeat in 3 days if needed. 2 tablet 0   ibuprofen  (ADVIL ) 800 MG tablet Take 800 mg by mouth every 8 (eight) hours as needed.     JOURNAVX 50 MG TABS TAKE 2 TABLETS BY MOUTH FOR FIRST DAY THEN 1 TABLET EVERY 12 HOURS BEGINNING 12 HOURS AFTER LOADING DOSE     levalbuterol  (XOPENEX  HFA) 45 MCG/ACT inhaler INHALE (2) PUFFS INTO THE LUNGS EVERY SIX HOURS AS NEEDED FOR WHEEZING. 15 g 3   nitrofurantoin , macrocrystal-monohydrate, (MACROBID ) 100 MG capsule Take 1 capsule (100 mg total) by mouth 2 (two) times daily. 14 capsule 0   ondansetron  (ZOFRAN -ODT) 8 MG disintegrating tablet Take 1 tablet (8 mg total) by mouth every 8 (eight) hours as needed. 20 tablet 6   pantoprazole  (PROTONIX ) 40 MG tablet TAKE ONE TABLET BY MOUTH ONCE DAILY. 30 tablet 1   propranolol  (INDERAL ) 10 MG tablet Take 10 mg by mouth 3 (three) times daily.     risperiDONE (RISPERDAL) 0.25 MG tablet Take 0.75 mg by mouth at bedtime.     sucralfate  (CARAFATE ) 1 g tablet 1 g tid crush mix with 30 ml water  and take po tid 90 tablet 0   SYRINGE-NEEDLE, DISP, 3 ML (B-D SYRINGE/NEEDLE 3CC/25GX5/8) 25G X 5/8  3 ML MISC 1 each by Does not apply route once a week. 10 each 0   tiZANidine  (ZANAFLEX ) 4 MG tablet Take 1 tablet (4 mg total) by mouth every 8 (eight) hours as needed for muscle spasms. 45 tablet 2   valACYclovir  (VALTREX ) 1000 MG tablet Take 1 bid for 10 days 20 tablet PRN   Vitamin D , Ergocalciferol , (DRISDOL) 1.25 MG (50000 UNIT) CAPS capsule Take 50,000 Units by mouth every Wednesday.     No current facility-administered medications on file prior to visit.    Allergies:  Allergies  Allergen Reactions   Gabapentin Other (See Comments)    Pt states that she was unresponsive after taking this medication, but her vitals remained stable.  Other Reaction(s): Fatigue, Mental Status  Changes   Metoclopramide  Hcl Anxiety and Other (See Comments)    Pt states that she felt like she was trapped in a box, and could not get out.  Pt also states that she had temporary loss of movement, weakness, and tingling.     Tramadol  Other (See Comments)    Seizures  Other Reaction(s): Seizures   Ativan  [Lorazepam ] Other (See Comments)    combative   Latex Itching, Rash and Other (See Comments)    Burns   Lyrica  [Pregabalin ] Other (See Comments)    suicidal   Iron  Other (See Comments)    PATIENT IS INTOLERANT TO ORAL IRON  , SHE DOES NOT ABSORB IT.     Ondansetron  Hcl     Other Reaction(s): MIgraines   Sulfa  Antibiotics Hives   Butrans  [Buprenorphine ] Rash    Patch caused rash, didn't help with pain   Nucynta [Tapentadol] Nausea And Vomiting   Propofol  Nausea And Vomiting   Tape Hives, Itching and Rash    Please use paper tape   Toradol  [Ketorolac  Tromethamine ] Anxiety    Vital Signs:  BP 123/82   Pulse 97   Ht 5' 6 (1.676 m)   Wt 171 lb (77.6 kg)   SpO2 100%   BMI 27.60 kg/m   Neurological Exam: MENTAL STATUS including orientation to time, place, person, recent and remote memory, attention span and concentration, language, and fund of knowledge is normal.  Speech is not dysarthric.  CRANIAL NERVES:  Normal conjugate, extra-ocular eye movements in all directions of gaze.  No ptosis.  Face is symmetric.   MOTOR:  Motor strength is 5/5 in the arms and proximally in the legs.  No atrophy, fasciculations or abnormal movements.  No pronator drift.  Tone is normal.    MSRs:  Reflexes are 1+/4 throughout.  SENSORY:  Vibration reduced below the ankles.  Temperature reduced from distal lower legs.   COORDINATION/GAIT:  Gait is normal.  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 Mild maxillary sinus disease Otherwise unremarkable MRI brain.  MRI brain wo contrast 07/29/2021: No acute intracranial abnormality. Stable and normal  noncontrast MRI appearance of the brain.  MRI lumbar spine wo contrast 07/29/2021: 1. No acute osseous injury of the lumbar spine. 2. 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 3. No evidence of cord compression.  Left sural nerve biopsy results rec'd 02/25/2016:  Focal myelin swellings, demyelinating/remyelinating findings, and instances of acute axonopathy. Comment:  These changes are not entirely specific and can be seen in hereditary neuropathy with pressure palsies or other hereditary motor sensory neuropathies.  There can also be seen to some degree with demyelinating/remyelinating diseases.   IMPRESSION/PLAN: Chronic migraine with worsening stabbing headaches.   -  Previous medications:  Topiramate , Cymbalta  (she takes for depression/pain), nortriptyline  (pain)  - Continue Aimovig  140mg  every 28 days - Start topiramate  25mg  daily - For rescue therapy, start maxalt  5mg  for severe migraine - Stop Nurtec  2.  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  Her exam is stable with distal sensory loss, no weakness.  Discussed that over time, neuropathy can slowly progress. Management is supportive.   Return to clinic in 6 months  Thank you for allowing me to participate in patient's care.  If I can answer any additional questions, I would be pleased to do so.    Sincerely,    Danamarie Minami K. Tobie, DO

## 2024-07-31 NOTE — Patient Instructions (Addendum)
 Start topiramate  25mg  daily  Stop Nurtec  For severe migraine, take maxalt  5mg .  Limit to twice per week.

## 2024-08-02 ENCOUNTER — Encounter: Payer: Self-pay | Admitting: Family Medicine

## 2024-08-02 ENCOUNTER — Other Ambulatory Visit: Payer: Self-pay | Admitting: Family Medicine

## 2024-08-02 DIAGNOSIS — F445 Conversion disorder with seizures or convulsions: Secondary | ICD-10-CM

## 2024-08-03 ENCOUNTER — Other Ambulatory Visit: Payer: Self-pay

## 2024-08-03 DIAGNOSIS — F445 Conversion disorder with seizures or convulsions: Secondary | ICD-10-CM

## 2024-08-03 MED ORDER — TIZANIDINE HCL 4 MG PO TABS: 4.0000 mg | ORAL_TABLET | Freq: Three times a day (TID) | ORAL | 2 refills | Status: DC | PRN

## 2024-08-16 ENCOUNTER — Other Ambulatory Visit: Payer: Self-pay | Admitting: Family Medicine

## 2024-08-16 DIAGNOSIS — F445 Conversion disorder with seizures or convulsions: Secondary | ICD-10-CM

## 2024-08-17 ENCOUNTER — Inpatient Hospital Stay: Payer: MEDICAID | Attending: Physician Assistant

## 2024-08-17 DIAGNOSIS — K909 Intestinal malabsorption, unspecified: Secondary | ICD-10-CM | POA: Diagnosis present

## 2024-08-17 DIAGNOSIS — D508 Other iron deficiency anemias: Secondary | ICD-10-CM | POA: Diagnosis present

## 2024-08-17 DIAGNOSIS — Z9884 Bariatric surgery status: Secondary | ICD-10-CM

## 2024-08-17 DIAGNOSIS — D500 Iron deficiency anemia secondary to blood loss (chronic): Secondary | ICD-10-CM

## 2024-08-17 LAB — CBC WITH DIFFERENTIAL/PLATELET
Abs Immature Granulocytes: 0.01 K/uL (ref 0.00–0.07)
Basophils Absolute: 0 K/uL (ref 0.0–0.1)
Basophils Relative: 1 %
Eosinophils Absolute: 0.2 K/uL (ref 0.0–0.5)
Eosinophils Relative: 3 %
HCT: 37.1 % (ref 36.0–46.0)
Hemoglobin: 12.9 g/dL (ref 12.0–15.0)
Immature Granulocytes: 0 %
Lymphocytes Relative: 27 %
Lymphs Abs: 1.5 K/uL (ref 0.7–4.0)
MCH: 33.4 pg (ref 26.0–34.0)
MCHC: 34.8 g/dL (ref 30.0–36.0)
MCV: 96.1 fL (ref 80.0–100.0)
Monocytes Absolute: 0.4 K/uL (ref 0.1–1.0)
Monocytes Relative: 7 %
Neutro Abs: 3.3 K/uL (ref 1.7–7.7)
Neutrophils Relative %: 62 %
Platelets: 202 K/uL (ref 150–400)
RBC: 3.86 MIL/uL — ABNORMAL LOW (ref 3.87–5.11)
RDW: 11.4 % — ABNORMAL LOW (ref 11.5–15.5)
WBC: 5.4 K/uL (ref 4.0–10.5)
nRBC: 0 % (ref 0.0–0.2)

## 2024-08-17 LAB — COMPREHENSIVE METABOLIC PANEL WITH GFR
ALT: 25 U/L (ref 0–44)
AST: 21 U/L (ref 15–41)
Albumin: 4.3 g/dL (ref 3.5–5.0)
Alkaline Phosphatase: 36 U/L — ABNORMAL LOW (ref 38–126)
Anion gap: 12 (ref 5–15)
BUN: 14 mg/dL (ref 6–20)
CO2: 19 mmol/L — ABNORMAL LOW (ref 22–32)
Calcium: 9 mg/dL (ref 8.9–10.3)
Chloride: 107 mmol/L (ref 98–111)
Creatinine, Ser: 0.61 mg/dL (ref 0.44–1.00)
GFR, Estimated: >60 mL/min (ref >60)
Glucose, Bld: 95 mg/dL (ref 70–99)
Potassium: 3.9 mmol/L (ref 3.5–5.1)
Sodium: 138 mmol/L (ref 135–145)
Total Bilirubin: 0.6 mg/dL (ref 0.0–1.2)
Total Protein: 7 g/dL (ref 6.5–8.1)

## 2024-08-17 LAB — IRON AND TIBC
Iron: 91 ug/dL (ref 28–170)
Saturation Ratios: 32 % — ABNORMAL HIGH (ref 10.4–31.8)
TIBC: 284 ug/dL (ref 250–450)
UIBC: 193 ug/dL

## 2024-08-17 LAB — VITAMIN B12: Vitamin B-12: 395 pg/mL (ref 180–914)

## 2024-08-17 LAB — FERRITIN: Ferritin: 130 ng/mL (ref 11–307)

## 2024-08-22 LAB — METHYLMALONIC ACID, SERUM: Methylmalonic Acid, Quantitative: 131 nmol/L (ref 0–378)

## 2024-08-24 ENCOUNTER — Inpatient Hospital Stay: Payer: MEDICAID | Admitting: Oncology

## 2024-08-24 DIAGNOSIS — Z9884 Bariatric surgery status: Secondary | ICD-10-CM | POA: Diagnosis not present

## 2024-08-24 DIAGNOSIS — E538 Deficiency of other specified B group vitamins: Secondary | ICD-10-CM | POA: Diagnosis not present

## 2024-08-24 DIAGNOSIS — D500 Iron deficiency anemia secondary to blood loss (chronic): Secondary | ICD-10-CM

## 2024-08-24 MED ORDER — CYANOCOBALAMIN 1000 MCG/ML IJ SOLN: INTRAMUSCULAR | 10 refills | Status: AC

## 2024-08-24 MED ORDER — BD SYRINGE/NEEDLE 25G X 5/8" 3 ML MISC: 1.0000 | 0 refills | Status: AC

## 2024-08-24 NOTE — Assessment & Plan Note (Addendum)
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  -She received 3 doses of IV Feraheme  on 02/13/2024, 02/21/2024 and 02/28/2024.  Tolerated them well. -Reports her appetite is below although she is maintaining her weight. -She denies any bleeding.  Does not have menstrual cycles. -Labs from 08/17/2024 shows an unremarkable CMP.  Iron  labs show iron  sats of 32% with normal TIBC, ferritin 130 with a normal hemoglobin. - No additional IV iron  needed at this time. -Return to clinic in approximately 3 months with labs a few days before and telephone visit.

## 2024-08-24 NOTE — Assessment & Plan Note (Addendum)
 Most recent B12 level is 395 which is more or less stable from past. -She has been giving herself B12 shots at home. -We discussed doing B12 shots monthly at home.  New Rx sent.

## 2024-08-24 NOTE — Progress Notes (Signed)
 [name] OFFICE PROGRESS NOTE  Alphonsa Glendia LABOR, MD  ASSESSMENT & PLAN:  Virtual Visit via Telephone Note  I connected with Jazell Rosenau on 08/24/24 at  1:30 PM EDT by telephone and verified that I am speaking with the correct person using two identifiers.  Location: Patient: Home Provider: Clinic    I discussed the limitations, risks, security and privacy concerns of performing an evaluation and management service by telephone and the availability of in person appointments. I also discussed with the patient that there may be a patient responsible charge related to this service. The patient expressed understanding and agreed to proceed. 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  -She received 3 doses of IV Feraheme  on 02/13/2024, 02/21/2024 and 02/28/2024.  Tolerated them well. -Reports her appetite is below although she is maintaining her weight. -She denies any bleeding.  Does not have menstrual cycles. -Labs from 08/17/2024 shows an unremarkable CMP.  Iron  labs show iron  sats of 32% with normal TIBC, ferritin 130 with a normal hemoglobin. - No additional IV iron  needed at this time. -Return to clinic in approximately 3 months with labs a few days before and telephone visit. Vitamin B12 deficiency disease Most recent B12 level is 395 which is more or less stable from past. -She has been giving herself B12 shots at home. -We discussed doing B12 shots monthly at home.  New Rx sent. H/O gastric bypass   Orders Placed This Encounter  Procedures   Iron  and TIBC    Standing Status:   Future    Expected Date:   11/23/2024    Expiration Date:   02/21/2025    Release to patient:   Immediate   Ferritin    Standing Status:   Future    Expected Date:   11/23/2024    Expiration Date:   02/21/2025    Release to patient:   Immediate   Vitamin B12    Standing Status:   Future     Expected Date:   11/23/2024    Expiration Date:   02/21/2025    Release to patient:   Immediate   CBC with Differential/Platelet    Standing Status:   Future    Expected Date:   11/23/2024    Expiration Date:   02/21/2025    Release to patient:   Immediate   Methylmalonic acid, serum    Standing Status:   Future    Expected Date:   11/23/2024    Expiration Date:   02/21/2025   Comprehensive metabolic panel with GFR    Standing Status:   Future    Expected Date:   11/23/2024    Expiration Date:   02/21/2025    INTERVAL HISTORY: Patient returns for follow-up for iron  deficiency and B12 deficiency.  She recently started B12 injections at home.  Reports doing well with these.  Reports an appetite is 75% energy levels are 40%.  Reports headaches that are chronic with numbness and burning in her hands and feet.  She has on and off palpitations. Reports good and bad days.   Reports she has chronic pain due to polyneuropathy as well as history of fractured back which took her out of work.  She is followed by neurology.  The allover body pain has improved since she has been on Journavax for recovering addicts.    Reports her energy levels are low but  she has good and bad days.  Reports numbness and burning in her hands and feet.  Occasional headache and palpitations.  She has not needed IV iron  since March 2025.     Does not have a menstural cycle. Had an ablation after her daughter was born. No opiates. No blood that she she knows in her stools.    Reports she quit THC and alcohol about 6 months ago.   Denies any bleeding, melena, hematochezia or bright red blood per rectum.  Reports a family history of hemochromatosis and unsure if she needs to be worked up for this.  We reviewed, ferritin, B12, CBC and MMA.  SUMMARY OF HEMATOLOGIC HISTORY: Oncology History   No history exists.     CBC    Component Value Date/Time   WBC 5.4 08/17/2024 1049   RBC 3.86 (L) 08/17/2024 1049   HGB  12.9 08/17/2024 1049   HGB 12.2 12/13/2022 1150   HCT 37.1 08/17/2024 1049   HCT 35.7 12/13/2022 1150   PLT 202 08/17/2024 1049   PLT 191 12/13/2022 1150   MCV 96.1 08/17/2024 1049   MCV 94 12/13/2022 1150   MCH 33.4 08/17/2024 1049   MCHC 34.8 08/17/2024 1049   RDW 11.4 (L) 08/17/2024 1049   RDW 11.8 12/13/2022 1150   LYMPHSABS 1.5 08/17/2024 1049   LYMPHSABS 1.6 06/29/2022 1202   MONOABS 0.4 08/17/2024 1049   EOSABS 0.2 08/17/2024 1049   EOSABS 0.3 06/29/2022 1202   BASOSABS 0.0 08/17/2024 1049   BASOSABS 0.0 06/29/2022 1202       Latest Ref Rng & Units 08/17/2024   10:49 AM 05/03/2024   10:41 AM 12/13/2022   11:50 AM  CMP  Glucose 70 - 99 mg/dL 95  90  91   BUN 6 - 20 mg/dL 14  13  18    Creatinine 0.44 - 1.00 mg/dL 9.38  9.44  9.50   Sodium 135 - 145 mmol/L 138  137  137   Potassium 3.5 - 5.1 mmol/L 3.9  4.2  3.8   Chloride 98 - 111 mmol/L 107  104  103   CO2 22 - 32 mmol/L 19  24  21    Calcium  8.9 - 10.3 mg/dL 9.0  9.5  8.5   Total Protein 6.5 - 8.1 g/dL 7.0   6.6   Total Bilirubin 0.0 - 1.2 mg/dL 0.6   0.3   Alkaline Phos 38 - 126 U/L 36   64   AST 15 - 41 U/L 21   23   ALT 0 - 44 U/L 25   16      Lab Results  Component Value Date   FERRITIN 130 08/17/2024   VITAMINB12 395 08/17/2024    There were no vitals filed for this visit.  Review of System:  Review of Systems  Cardiovascular:  Positive for palpitations.  Gastrointestinal:  Negative for abdominal pain, blood in stool, constipation and diarrhea.  Neurological:  Positive for dizziness, tingling and sensory change.    Physical Exam: Physical Exam Neurological:     Mental Status: She is alert and oriented to person, place, and time.      I provided 20 minutes of non face-to-face telephone visit time during this encounter, and > 50% was spent counseling as documented under my assessment & plan.   Delon Hope, NP 08/24/2024 1:57 PM

## 2024-09-02 ENCOUNTER — Other Ambulatory Visit: Payer: Self-pay | Admitting: Family Medicine

## 2024-09-02 DIAGNOSIS — F445 Conversion disorder with seizures or convulsions: Secondary | ICD-10-CM

## 2024-09-03 ENCOUNTER — Other Ambulatory Visit: Payer: Self-pay | Admitting: Family Medicine

## 2024-09-06 ENCOUNTER — Other Ambulatory Visit: Payer: Self-pay | Admitting: Family Medicine

## 2024-09-06 DIAGNOSIS — F445 Conversion disorder with seizures or convulsions: Secondary | ICD-10-CM

## 2024-09-22 ENCOUNTER — Other Ambulatory Visit: Payer: Self-pay | Admitting: Family Medicine

## 2024-09-22 DIAGNOSIS — F445 Conversion disorder with seizures or convulsions: Secondary | ICD-10-CM

## 2024-09-24 ENCOUNTER — Telehealth: Payer: Self-pay | Admitting: Family Medicine

## 2024-09-24 ENCOUNTER — Other Ambulatory Visit: Payer: Self-pay | Admitting: Family Medicine

## 2024-09-24 NOTE — Telephone Encounter (Signed)
 Refill on  tiZANidine  (ZANAFLEX ) 4 MG tablet   [facility]

## 2024-09-25 NOTE — Telephone Encounter (Signed)
 Cook, Jayce G, DO      09/24/24  4:18 PM Medication was sent in by Dr. Glendia on 10/2

## 2024-10-04 ENCOUNTER — Telehealth: Payer: Self-pay | Admitting: Neurology

## 2024-10-04 NOTE — Telephone Encounter (Signed)
 Unfortunately, there is no treatment for hereditary neuropathy, but we can discuss this further at her follow-up in December.

## 2024-10-04 NOTE — Telephone Encounter (Signed)
 Pt called in this morning. Pt stated that her referral to [facility] has been decline for Hereditary neuropathy. Pt wants to know what else can she  do. Pt  asks can Dr. Tobie  treat  her for this, and  if not , can she recommend someone for this .  Pt is in the process of filing for disability as well. Thanks

## 2024-10-05 NOTE — Telephone Encounter (Signed)
 Called patient and informed her of Dr. Anthony advice.Patient verbalized understanding and had no further questions or concerns.

## 2024-10-06 ENCOUNTER — Other Ambulatory Visit: Payer: Self-pay | Admitting: Family Medicine

## 2024-10-06 DIAGNOSIS — F445 Conversion disorder with seizures or convulsions: Secondary | ICD-10-CM

## 2024-10-11 ENCOUNTER — Other Ambulatory Visit: Payer: Self-pay | Admitting: Family Medicine

## 2024-10-11 DIAGNOSIS — F445 Conversion disorder with seizures or convulsions: Secondary | ICD-10-CM

## 2024-10-17 ENCOUNTER — Encounter: Payer: Self-pay | Admitting: Family Medicine

## 2024-10-17 ENCOUNTER — Ambulatory Visit (INDEPENDENT_AMBULATORY_CARE_PROVIDER_SITE_OTHER): Payer: MEDICAID | Admitting: Family Medicine

## 2024-10-17 VITALS — BP 118/83 | HR 94 | Ht 66.0 in | Wt 167.0 lb

## 2024-10-17 DIAGNOSIS — F445 Conversion disorder with seizures or convulsions: Secondary | ICD-10-CM | POA: Diagnosis not present

## 2024-10-17 DIAGNOSIS — H9202 Otalgia, left ear: Secondary | ICD-10-CM

## 2024-10-17 DIAGNOSIS — F322 Major depressive disorder, single episode, severe without psychotic features: Secondary | ICD-10-CM | POA: Diagnosis not present

## 2024-10-17 DIAGNOSIS — M5432 Sciatica, left side: Secondary | ICD-10-CM | POA: Diagnosis not present

## 2024-10-17 DIAGNOSIS — R635 Abnormal weight gain: Secondary | ICD-10-CM

## 2024-10-17 MED ORDER — TIZANIDINE HCL 4 MG PO TABS: 4.0000 mg | ORAL_TABLET | Freq: Three times a day (TID) | ORAL | 4 refills | Status: AC | PRN

## 2024-10-17 MED ORDER — MUPIROCIN 2 % EX OINT: TOPICAL_OINTMENT | CUTANEOUS | 0 refills | Status: AC

## 2024-10-17 NOTE — Progress Notes (Addendum)
   Subjective:    Patient ID: Penny [name], female    DOB: 1986-07-05, 38 y.o.   MRN: [id number]  HPIPatient has a very difficult time with chronic health issues.  She is doing the best she can to work with these issues but they are debilitating. She has chronic neuropathy pain She has a history of opioid abuse and she has been opioid free for several years and she undergoes regular therapy for mental health depression and anxiety along with seeing a medical expert to help her with medicines to help with depression She has a lot of chronic pain but desires to stay away from taking any type of opioid pain medicine. Remote history of obesity and family history of obesity Her weight is going up She finds it very frustrating because she cannot be physically active.  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  She can never predict when she is going to have a bad day.  When she does have a bad day she has to lay in bed most of the day.  In the evening given standard workplace it is extremely unlikely that any workplace can grant her accommodations to fit her current physical and mental health.  In my opinion she is completely disabled.  She would  end up missing multiple days of work per week or month because of inability to meet a standard 8-hour workday Review of Systems     Objective:   Physical Exam General-in no acute distress Eyes-no discharge Lungs-respiratory rate normal, CTA CV-no murmurs,RRR Extremities skin warm dry no edema Neuro grossly normal Behavior normal, alert Excoriated left ear canal  Eardrum looks good     Assessment & Plan:  1. Left ear pain (Primary) Referral to ENT Bactroban  apply thin amount twice daily carefully  - Ambulatory referral to ENT  2. Conversion disorder with seizures or convulsions Continue psychiatric counseling as well as therapeutic management of her medicines - tiZANidine  (ZANAFLEX ) 4 MG tablet; Take 1 tablet (4 mg total) by mouth every 8 (eight) hours as needed for muscle spasms.  Dispense: 90 tablet; Refill: 4  3. Depression, major, single episode, severe (HCC) Continue care with psychiatry  4. Sciatica of left side Stretching gentle range of motion when necessary tizanidine  for muscle spasms never drive if feeling drowsy  5. Weight gain Portion control Difficult for patient to stay physically active  Due to the chronic nature of her health issues she is not capable of working or adhering to a standard work schedule Patient is disabled-she is getting a clinical research associate to try to help her with disability see discussion above

## 2024-11-07 ENCOUNTER — Encounter: Payer: Self-pay | Admitting: Family Medicine

## 2024-11-09 ENCOUNTER — Other Ambulatory Visit: Payer: Self-pay | Admitting: Neurology

## 2024-11-12 ENCOUNTER — Ambulatory Visit: Payer: MEDICAID | Admitting: Neurology

## 2024-11-15 ENCOUNTER — Other Ambulatory Visit (HOSPITAL_COMMUNITY): Payer: Self-pay

## 2024-11-16 ENCOUNTER — Other Ambulatory Visit: Payer: Self-pay | Admitting: Family Medicine

## 2024-11-16 DIAGNOSIS — F445 Conversion disorder with seizures or convulsions: Secondary | ICD-10-CM

## 2024-11-17 ENCOUNTER — Telehealth: Payer: MEDICAID | Admitting: Nurse Practitioner

## 2024-11-17 ENCOUNTER — Encounter: Payer: MEDICAID | Admitting: Nurse Practitioner

## 2024-11-17 DIAGNOSIS — B9789 Other viral agents as the cause of diseases classified elsewhere: Secondary | ICD-10-CM | POA: Diagnosis not present

## 2024-11-17 DIAGNOSIS — J019 Acute sinusitis, unspecified: Secondary | ICD-10-CM | POA: Diagnosis not present

## 2024-11-17 MED ORDER — IPRATROPIUM BROMIDE 0.03 % NA SOLN: 2.0000 | Freq: Two times a day (BID) | NASAL | 0 refills | Status: AC

## 2024-11-17 MED ORDER — LIDOCAINE VISCOUS HCL 2 % MT SOLN: 15.0000 mL | OROMUCOSAL | 0 refills | Status: AC | PRN

## 2024-11-17 NOTE — Progress Notes (Signed)
 Evisit completed

## 2024-11-17 NOTE — Progress Notes (Signed)
 We are sorry that you are not feeling well.  Here is how we plan to help!  Providers prescribe antibiotics to treat infections caused by bacteria. Antibiotics are very powerful in treating bacterial infections when they are used properly. To maintain their effectiveness, they should be used only when necessary. Overuse of antibiotics has resulted in the development of superbugs that are resistant to treatment!    After careful review of your answers, I would not recommend an antibiotic for your condition.  Antibiotics are not effective against viruses and therefore should not be used to treat them. Common examples of infections caused by viruses include colds and flu  If you are not feeling better in 4-5 days please feel free to reach back out to us  or your PCP for consideration of antibiotics.  I recommend neil med sinus rinse as well   Based on what you have shared with me it looks like you have sinusitis.  Sinusitis is inflammation and infection in the sinus cavities of the head.  Based on your presentation I believe you most likely have Acute Viral Sinusitis.This is an infection most likely caused by a virus. There is not specific treatment for viral sinusitis other than to help you with the symptoms until the infection runs its course.  You may use an oral decongestant such as Mucinex D or if you have glaucoma or high blood pressure use plain Mucinex. Saline nasal spray help and can safely be used as often as needed for congestion, I have prescribed: Ipratropium Bromide  nasal spray 0.03% 2 sprays in eah nostril 2-3 times a day and viscous lidocaine  for your sore throat.   Some authorities believe that zinc  sprays or the use of Echinacea may shorten the course of your symptoms.  Sinus infections are not as easily transmitted as other respiratory infection, however we still recommend that you avoid close contact with loved ones, especially the very young and elderly.  Remember to wash your hands  thoroughly throughout the day as this is the number one way to prevent the spread of infection!  Home Care: Only take medications as instructed by your medical team. Do not take these medications with alcohol. A steam or ultrasonic humidifier can help congestion.  You can place a towel over your head and breathe in the steam from hot water  coming from a faucet. Avoid close contacts especially the very young and the elderly. Cover your mouth when you cough or sneeze. Always remember to wash your hands.  Get Help Right Away If: You develop worsening fever or sinus pain. You develop a severe head ache or visual changes. Your symptoms persist after you have completed your treatment plan.  Make sure you Understand these instructions. Will watch your condition. Will get help right away if you are not doing well or get worse.  Your e-visit answers were reviewed by a board certified advanced clinical practitioner to complete your personal care plan.  Depending on the condition, your plan could have included both over the counter or prescription medications.  If there is a problem please reply  once you have received a response from your provider.  Your safety is important to us .  If you have drug allergies check your prescription carefully.    You can use MyChart to ask questions about todays visit, request a non-urgent call back, or ask for a work or school excuse for 24 hours related to this e-Visit. If it has been greater than 24 hours you will need  to follow up with your provider, or enter a new e-Visit to address those concerns.  You will get an e-mail in the next two days asking about your experience.  I hope that your e-visit has been valuable and will speed your recovery. Thank you for using e-visits.  I have spent 5 minutes in review of e-visit questionnaire, review and updating patient chart, medical decision making and response to patient.   Braidyn Peace [place], NP

## 2024-11-17 NOTE — Progress Notes (Signed)
°  Duplicate visit for viral sinusitis

## 2024-11-27 ENCOUNTER — Institutional Professional Consult (permissible substitution) (INDEPENDENT_AMBULATORY_CARE_PROVIDER_SITE_OTHER): Payer: MEDICAID | Admitting: Otolaryngology

## 2024-11-28 ENCOUNTER — Encounter: Payer: Self-pay | Admitting: Neurology

## 2024-11-28 ENCOUNTER — Ambulatory Visit: Payer: MEDICAID | Admitting: Neurology

## 2024-12-07 ENCOUNTER — Inpatient Hospital Stay: Payer: MEDICAID

## 2024-12-10 ENCOUNTER — Inpatient Hospital Stay: Payer: MEDICAID | Attending: Physician Assistant

## 2024-12-10 DIAGNOSIS — D500 Iron deficiency anemia secondary to blood loss (chronic): Secondary | ICD-10-CM

## 2024-12-10 DIAGNOSIS — Z9884 Bariatric surgery status: Secondary | ICD-10-CM

## 2024-12-10 LAB — CBC WITH DIFFERENTIAL/PLATELET
Abs Immature Granulocytes: 0.01 K/uL (ref 0.00–0.07)
Basophils Absolute: 0 K/uL (ref 0.0–0.1)
Basophils Relative: 1 %
Eosinophils Absolute: 0.2 K/uL (ref 0.0–0.5)
Eosinophils Relative: 4 %
HCT: 38.8 % (ref 36.0–46.0)
Hemoglobin: 13.2 g/dL (ref 12.0–15.0)
Immature Granulocytes: 0 %
Lymphocytes Relative: 30 %
Lymphs Abs: 1.5 K/uL (ref 0.7–4.0)
MCH: 32.7 pg (ref 26.0–34.0)
MCHC: 34 g/dL (ref 30.0–36.0)
MCV: 96 fL (ref 80.0–100.0)
Monocytes Absolute: 0.5 K/uL (ref 0.1–1.0)
Monocytes Relative: 10 %
Neutro Abs: 2.7 K/uL (ref 1.7–7.7)
Neutrophils Relative %: 55 %
Platelets: 224 K/uL (ref 150–400)
RBC: 4.04 MIL/uL (ref 3.87–5.11)
RDW: 11.8 % (ref 11.5–15.5)
WBC: 4.9 K/uL (ref 4.0–10.5)
nRBC: 0 % (ref 0.0–0.2)

## 2024-12-10 LAB — VITAMIN B12: Vitamin B-12: 699 pg/mL (ref 180–914)

## 2024-12-10 LAB — COMPREHENSIVE METABOLIC PANEL WITH GFR
ALT: 21 U/L (ref 0–44)
AST: 22 U/L (ref 15–41)
Albumin: 4.5 g/dL (ref 3.5–5.0)
Alkaline Phosphatase: 54 U/L (ref 38–126)
Anion gap: 14 (ref 5–15)
BUN: 10 mg/dL (ref 6–20)
CO2: 23 mmol/L (ref 22–32)
Calcium: 9.4 mg/dL (ref 8.9–10.3)
Chloride: 103 mmol/L (ref 98–111)
Creatinine, Ser: 0.67 mg/dL (ref 0.44–1.00)
GFR, Estimated: >60 mL/min
Glucose, Bld: 101 mg/dL — ABNORMAL HIGH (ref 70–99)
Potassium: 3.8 mmol/L (ref 3.5–5.1)
Sodium: 140 mmol/L (ref 135–145)
Total Bilirubin: 0.5 mg/dL (ref 0.0–1.2)
Total Protein: 7 g/dL (ref 6.5–8.1)

## 2024-12-10 LAB — FERRITIN: Ferritin: 100 ng/mL (ref 11–307)

## 2024-12-10 LAB — IRON AND TIBC
Iron: 102 ug/dL (ref 28–170)
Saturation Ratios: 35 % — ABNORMAL HIGH (ref 10.4–31.8)
TIBC: 295 ug/dL (ref 250–450)
UIBC: 193 ug/dL

## 2024-12-11 ENCOUNTER — Encounter: Payer: Self-pay | Admitting: Family Medicine

## 2024-12-11 ENCOUNTER — Other Ambulatory Visit: Payer: Self-pay

## 2024-12-11 DIAGNOSIS — Z91010 Allergy to peanuts: Secondary | ICD-10-CM

## 2024-12-11 LAB — METHYLMALONIC ACID, SERUM: Methylmalonic Acid, Quantitative: 133 nmol/L (ref 0–378)

## 2024-12-11 NOTE — Telephone Encounter (Signed)
 Nurses Urgent consultation with allergist Also please encourage patient to do one of the following #1 if having emergency like situation difficulty breathing passing out etc. 911 #2 if it is just itching from the breaking out with hives avoid nuts.  Also schedule office visit with myself or one of the clinicians and in the meantime Benadryl  25 mg every 4 hours as needed hives and itching caution drowsiness  #3 go ahead with referral for consultation with allergist as stated above

## 2024-12-12 ENCOUNTER — Encounter: Payer: Self-pay | Admitting: Family Medicine

## 2024-12-13 ENCOUNTER — Other Ambulatory Visit: Payer: Self-pay | Admitting: Family Medicine

## 2024-12-13 DIAGNOSIS — R112 Nausea with vomiting, unspecified: Secondary | ICD-10-CM

## 2024-12-13 DIAGNOSIS — L509 Urticaria, unspecified: Secondary | ICD-10-CM

## 2024-12-14 ENCOUNTER — Inpatient Hospital Stay: Payer: MEDICAID | Admitting: Oncology

## 2024-12-14 DIAGNOSIS — M8580 Other specified disorders of bone density and structure, unspecified site: Secondary | ICD-10-CM | POA: Diagnosis not present

## 2024-12-14 DIAGNOSIS — D508 Other iron deficiency anemias: Secondary | ICD-10-CM

## 2024-12-14 DIAGNOSIS — G629 Polyneuropathy, unspecified: Secondary | ICD-10-CM

## 2024-12-14 DIAGNOSIS — E538 Deficiency of other specified B group vitamins: Secondary | ICD-10-CM | POA: Diagnosis not present

## 2024-12-14 MED ORDER — CYANOCOBALAMIN 1000 MCG/ML IJ SOLN: 1000.0000 ug | INTRAMUSCULAR | 10 refills | Status: AC

## 2024-12-14 NOTE — Assessment & Plan Note (Signed)
 Calcium/vit d

## 2024-12-14 NOTE — Progress Notes (Unsigned)
 "  [name] OFFICE PROGRESS NOTE  Alphonsa Glendia LABOR, MD  ASSESSMENT & PLAN:  Virtual Visit via Telephone Note  I connected with [name] on 12/14/2024 at  2:15 PM EST by telephone and verified that I am speaking with the correct person using two identifiers.  Location: Patient: Home Provider: Clinic    I discussed the limitations, risks, security and privacy concerns of performing an evaluation and management service by telephone and the availability of in person appointments. I also discussed with the patient that there may be a patient responsible charge related to this service. The patient expressed understanding and agreed to proceed. 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  -She received 3 doses of IV Feraheme  on 02/13/2024, 02/21/2024 and 02/28/2024.  Tolerated them well. -Reports her appetite is below although she is maintaining her weight. -She denies any bleeding.  Does not have menstrual cycles. -Labs from 08/17/2024 shows an unremarkable CMP.  Iron  labs show iron  sats of 32% with normal TIBC, ferritin 130 with a normal hemoglobin. - No additional IV iron  needed at this time. -Return to clinic in approximately 3 months with labs a few days before and telephone visit. Vitamin B12 deficiency disease Most recent B12 level is 395 which is more or less stable from past. -She has been giving herself B12 shots at home. -We discussed doing B12 shots monthly at home.  New Rx sent. Osteopenia, unspecified location Calcium  vit d.   No orders of the defined types were placed in this encounter.   INTERVAL HISTORY: Patient returns for follow-up for iron  deficiency and B12 deficiency.  She recently started B12 injections at home.  Reports doing well with these.  Reports an appetite is 75% energy levels are 40%.  Reports headaches that are chronic with numbness and burning in her hands and feet.   She has on and off palpitations. Reports good and bad days.   Reports she has chronic pain due to polyneuropathy as well as history of fractured back which took her out of work.  She is followed by neurology.  The allover body pain has improved since she has been on Journavax for recovering addicts.    Reports her energy levels are low but she has good and bad days.  Reports numbness and burning in her hands and feet.  Occasional headache and palpitations.  She has not needed IV iron  since March 2025.     Does not have a menstural cycle. Had an ablation after her daughter was born. No opiates. No blood that she she knows in her stools.    Reports she quit THC and alcohol about 6 months ago.   Denies any bleeding, melena, hematochezia or bright red blood per rectum.  Reports a family history of hemochromatosis and unsure if she needs to be worked up for this.  We reviewed, ferritin, B12, CBC and MMA.  SUMMARY OF HEMATOLOGIC HISTORY: Oncology History   No problem history exists.     CBC    Component Value Date/Time   WBC 4.9 12/10/2024 1112   RBC 4.04 12/10/2024 1112   HGB 13.2 12/10/2024 1112   HGB 12.2 12/13/2022 1150   HCT 38.8 12/10/2024 1112   HCT 35.7 12/13/2022 1150   PLT 224 12/10/2024 1112   PLT 191 12/13/2022 1150   MCV 96.0 12/10/2024 1112   MCV 94 12/13/2022 1150   MCH 32.7 12/10/2024 1112   MCHC  34.0 12/10/2024 1112   RDW 11.8 12/10/2024 1112   RDW 11.8 12/13/2022 1150   LYMPHSABS 1.5 12/10/2024 1112   LYMPHSABS 1.6 06/29/2022 1202   MONOABS 0.5 12/10/2024 1112   EOSABS 0.2 12/10/2024 1112   EOSABS 0.3 06/29/2022 1202   BASOSABS 0.0 12/10/2024 1112   BASOSABS 0.0 06/29/2022 1202       Latest Ref Rng & Units 12/10/2024   11:12 AM 08/17/2024   10:49 AM 05/03/2024   10:41 AM  CMP  Glucose 70 - 99 mg/dL 898  95  90   BUN 6 - 20 mg/dL 10  14  13    Creatinine 0.44 - 1.00 mg/dL 9.32  9.38  9.44   Sodium 135 - 145 mmol/L 140  138  137   Potassium 3.5 - 5.1  mmol/L 3.8  3.9  4.2   Chloride 98 - 111 mmol/L 103  107  104   CO2 22 - 32 mmol/L 23  19  24    Calcium  8.9 - 10.3 mg/dL 9.4  9.0  9.5   Total Protein 6.5 - 8.1 g/dL 7.0  7.0    Total Bilirubin 0.0 - 1.2 mg/dL 0.5  0.6    Alkaline Phos 38 - 126 U/L 54  36    AST 15 - 41 U/L 22  21    ALT 0 - 44 U/L 21  25       Lab Results  Component Value Date   FERRITIN 100 12/10/2024   VITAMINB12 699 12/10/2024    There were no vitals filed for this visit.  Review of System:  Review of Systems  Cardiovascular:  Positive for palpitations.  Gastrointestinal:  Negative for abdominal pain, blood in stool, constipation and diarrhea.  Neurological:  Positive for dizziness, tingling and sensory change.    Physical Exam: Physical Exam Neurological:     Mental Status: She is alert and oriented to person, place, and time.      I provided 20 minutes of non face-to-face telephone visit time during this encounter, and > 50% was spent counseling as documented under my assessment & plan.   Delon Hope, NP 12/14/2024 2:29 PM "

## 2024-12-14 NOTE — Assessment & Plan Note (Addendum)
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  -She received 3 doses of IV Feraheme  on 02/13/2024, 02/21/2024 and 02/28/2024.  Tolerated them well. -Reports her appetite is low although she is maintaining her weight. -She denies any bleeding.  Does not have menstrual cycles. -Labs from 12/10/2024 show iron  saturation 35%, TIBC 295, normal hemoglobin. - No additional IV iron  needed at this time. -Return to clinic in approximately 3 months with labs a few days before and telephone visit.

## 2024-12-14 NOTE — Assessment & Plan Note (Addendum)
 Most recent B12 level is 699 previously 395.  MMA is 133. -She has been giving herself B12 shots at home. -We discussed doing B12 shots monthly at home.  New Rx sent.

## 2024-12-17 ENCOUNTER — Encounter: Payer: Self-pay | Admitting: Physician Assistant

## 2024-12-17 NOTE — Assessment & Plan Note (Addendum)
 Patient sees [facility] neurology. She is not taking any narcotics due to history of substance abuse. Recently denied disability because patient not taking prescribed medications due to being sober.

## 2024-12-18 ENCOUNTER — Encounter: Payer: Self-pay | Admitting: Family Medicine

## 2024-12-19 ENCOUNTER — Encounter: Payer: Self-pay | Admitting: Family Medicine

## 2024-12-19 NOTE — Telephone Encounter (Signed)
 I did a letter Please send it to the patient or have her pick it up if she needs a signed copy have me sign it thank you

## 2024-12-22 ENCOUNTER — Encounter: Payer: Self-pay | Admitting: Family Medicine

## 2024-12-25 ENCOUNTER — Ambulatory Visit (INDEPENDENT_AMBULATORY_CARE_PROVIDER_SITE_OTHER): Payer: MEDICAID | Admitting: Otolaryngology

## 2024-12-25 ENCOUNTER — Encounter (INDEPENDENT_AMBULATORY_CARE_PROVIDER_SITE_OTHER): Payer: Self-pay | Admitting: Otolaryngology

## 2024-12-25 VITALS — BP 115/76 | HR 92 | Wt 167.0 lb

## 2024-12-25 DIAGNOSIS — H9313 Tinnitus, bilateral: Secondary | ICD-10-CM

## 2024-12-25 DIAGNOSIS — H608X3 Other otitis externa, bilateral: Secondary | ICD-10-CM

## 2024-12-25 DIAGNOSIS — H9190 Unspecified hearing loss, unspecified ear: Secondary | ICD-10-CM | POA: Diagnosis not present

## 2024-12-25 DIAGNOSIS — H9203 Otalgia, bilateral: Secondary | ICD-10-CM

## 2024-12-25 MED ORDER — BETAMETHASONE DIPROPIONATE 0.05 % EX CREA: TOPICAL_CREAM | Freq: Two times a day (BID) | CUTANEOUS | 0 refills | Status: AC

## 2024-12-25 MED ORDER — NEOMYCIN-POLYMYXIN-HC 3.5-10000-1 OT SOLN: 4.0000 [drp] | Freq: Two times a day (BID) | OTIC | 0 refills | Status: AC

## 2024-12-25 NOTE — Progress Notes (Signed)
 Dear Dr. Alphonsa, Here is my assessment for our mutual patient, Penny [name]. Thank you for allowing me the opportunity to care for your patient. Please do not hesitate to contact me should you have any other questions. Sincerely, Dr. Eldora Blanch  Otolaryngology Clinic Note Referring provider: Dr. Alphonsa HPI:  Penny [name] is a 39 y.o. female kindly referred by Dr. Alphonsa for evaluation of otalgia  Initial visit (12/2024): Discussed the use of AI scribe software for clinical note transcription with the patient, who gave verbal consent to proceed. History of Present Illness Penny [name] is a 39 year old female who presents with bilateral ear canal bleeding and otalgia  For 4 months she has had intermittent bilateral ear canal bleeding, right greater than left.  She has bilateral R>L otalgia and some preauricular discomfort. Pain worsens when lying on either side. She notes intermittent scant waxy otorrhea. Does use qtips  She has persistent bilateral ear pruritus that is sometimes constant. She perceives decreased hearing from baseline and has intermittent tinnitus She denies aural fullness, vertigo  She tried a 5-day course of a topical ointment to the ears without benefit. She has not had recent audiologic testing.  She has temporomandibular joint disorder with nocturnal bruxism and pain near the jaw joint on mouth opening.  Does have h/o migraines Patient denies: fullness, vertigo, drainage Patient additionally denies: deep pain in ear canal, eustachian tube symptoms such as popping, crackling, sensitive to pressure changes Patient also denies barotrauma, vestibular suppressant use, ototoxic medication use  Patient otherwise denies: - dysphagia, odynophagia, unintentional weight loss - changes in voice, shortness of breath, hemoptysis  - neck masses   ENT Surgery: BTT and T&A (peds) Personal or FHx of bleeding dz or anesthesia difficulty: no  AP/AC: no  Tobacco: no  PMHx:  Conversion Dz, MDD, Neuropathy, h/o opioid use with Chronic pain, Migraines  Independent Review of Additional Tests or Records:  Dr. Alphonsa (10/17/2024): noted left ear pain in left ear canal with intermittent bleeding (?); Dx: left ear pain; Rx: ref to ENT Labs CBC and CMP (12/10/2024): WBC 4.9, Plt 224; Eos 200; BUN/Cr 10/0.67 [facility] 03/08/2022 independently reviewed with respect to ears: mastoids and ME well aerated; cuts thick so suboptimal but no gross otic capsule or ossicular chain pathology noted PMH/Meds/All/SocHx/FamHx/ROS:   Past Medical History:  Diagnosis Date   ADD (attention deficit disorder)    Anemia 2011   2o to GASTRIC BYPASS   Anginal pain    Anxiety    Asthma    Blood transfusion without reported diagnosis    Chronic daily headache    Depression    Depression    Dysmenorrhea 09/18/2013   Dysrhythmia    Elevated liver enzymes JUL 2011ALK PHOS 111-127 AST  143-267 ALT  213-321T BILI 0.6  ALB  3.7-4.06 Jun 2011 ALK PHOS 118 AST 24 ALT 42 T BILI 0.4 ALB 3.9   Encounter for drug rehabilitation    at behavioral health for opioid addiction about 4 years ago   Family history of adverse reaction to anesthesia    'dad had to be kept on pump for breathing for morphine '   Fatty liver    Fibroid 01/18/2017   Fibromyalgia    Gastric bypass status for obesity    Gastritis JULY 2011   Heart murmur    Hereditary and idiopathic peripheral neuropathy 01/15/2015   History of Holter monitoring    Hx of opioid abuse (HCC)    for about 4  years, about 4 years ago   Interstitial cystitis    Iron  deficiency anemia 07/23/2010   Irritable bowel syndrome 2012 DIARRHEA   JUN 2012 TTG IgA 14.9   IUD FEB 2010   Lupus    Menorrhagia 07/18/2013   Migraines    Obesity (BMI 30-39.9) 2011 228 LBS BMI 36.8   Osteoporosis    Ovarian cyst    Patient desires pregnancy 09/18/2013   Polyneuropathy    PONV (postoperative nausea and vomiting) seizure post-operatively   seizure following ankle  surgery   Potassium (K) deficiency    Pregnant 12/25/2013   Psychiatric pseudoseizure    RLQ abdominal pain 07/18/2013   Sciatica of left side 11/14/2014   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   Stress 09/18/2013     Past Surgical History:  Procedure Laterality Date   BIOPSY  04/10/2015   Procedure: BIOPSY;  Surgeon: Lamar CHRISTELLA Hollingshead, MD;  Location: [facility];  Service: Endoscopy;;   BIOPSY  04/15/2021   Procedure: BIOPSY;  Surgeon: Eartha Flavors, Toribio, MD;  [facility]: [facility];  Service: Gastroenterology;;   cath self every nite     for sodium bicarb injection (discontinued 2013)   CHOLECYSTECTOMY  2005   biliary dyskinesia   COLONOSCOPY  JUN 2012 ABD PN/DIARRHEA WITH PROPOFOL    NL COLON   COLONOSCOPY WITH PROPOFOL  N/A 01/16/2021   Procedure: COLONOSCOPY WITH PROPOFOL ;  Surgeon: Eartha Flavors Toribio, MD;  [facility]: [facility];  Service: Gastroenterology;  Laterality: N/A;  1:45   DILATION AND CURETTAGE OF UTERUS     DILITATION & CURRETTAGE/HYSTROSCOPY WITH NOVASURE ABLATION N/A 03/24/2017   Procedure: DILATATION & CURETTAGE/HYSTEROSCOPY WITH NOVASURE ENDOMETRIAL ABLATION;  Surgeon: Norleen Edsel GAILS, MD;  Location: [facility];  Service: Gynecology;  Laterality: N/A;   ESOPHAGEAL DILATION N/A 04/10/2015   Procedure: ESOPHAGEAL DILATION WITH 54FR MALONEY DILATOR;  Surgeon: Lamar CHRISTELLA Hollingshead, MD;  Location: [facility];  Service: Endoscopy;  Laterality: N/A;   ESOPHAGOGASTRODUODENOSCOPY     ESOPHAGOGASTRODUODENOSCOPY (EGD) WITH PROPOFOL  N/A 04/10/2015   Procedure: ESOPHAGOGASTRODUODENOSCOPY (EGD) WITH PROPOFOL ;  Surgeon: Lamar CHRISTELLA Hollingshead, MD;  Location: [facility];  Service: Endoscopy;  Laterality: N/A;   ESOPHAGOGASTRODUODENOSCOPY (EGD) WITH PROPOFOL  N/A 12/06/2016   Procedure: ESOPHAGOGASTRODUODENOSCOPY (EGD) WITH PROPOFOL ;  Surgeon: Margo LITTIE Haddock, MD;  [facility];  Service: Endoscopy;  Laterality: N/A;   ESOPHAGOGASTRODUODENOSCOPY (EGD) WITH PROPOFOL  N/A 04/27/2019   Procedure:  ESOPHAGOGASTRODUODENOSCOPY (EGD) WITH PROPOFOL ;  Surgeon: Golda Claudis PENNER, MD;  [facility];  Service: Endoscopy;  Laterality: N/A;  730   ESOPHAGOGASTRODUODENOSCOPY (EGD) WITH PROPOFOL  N/A 08/31/2019   Procedure: ESOPHAGOGASTRODUODENOSCOPY (EGD) WITH PROPOFOL ;  Surgeon: Golda Claudis PENNER, MD;  [facility];  Service: Endoscopy;  Laterality: N/A;   ESOPHAGOGASTRODUODENOSCOPY (EGD) WITH PROPOFOL  N/A 01/16/2021   Procedure: ESOPHAGOGASTRODUODENOSCOPY (EGD) WITH PROPOFOL ;  Surgeon: Eartha Flavors Toribio, MD;  [facility];  Service: Gastroenterology;  Laterality: N/A;   ESOPHAGOGASTRODUODENOSCOPY (EGD) WITH PROPOFOL  N/A 04/15/2021   Procedure: ESOPHAGOGASTRODUODENOSCOPY (EGD) WITH PROPOFOL ;  Surgeon: Eartha Flavors Toribio, MD;  [facility];  Service: Gastroenterology;  Laterality: N/A;  9:00 AM   GAB  2007   in [place]-POUCH 5 CM   GASTRIC BYPASS  06/2006   HYSTEROSCOPY WITH D & C N/A 09/12/2014   Procedure: DILATATION AND CURETTAGE /HYSTEROSCOPY;  Surgeon: Norleen Edsel GAILS, MD;  Location: [facility];  Service: Gynecology;  Laterality: N/A;   KYPHOPLASTY N/A 08/02/2018   Procedure: KYPHOPLASTY T12;  Surgeon: Burnetta,  Donaciano, MD;  Location: [facility];  Service: Orthopedics;  Laterality: N/A;  60 mins   LAPAROSCOPIC TUBAL LIGATION Bilateral 03/24/2017   Procedure: LAPAROSCOPIC TUBAL LIGATION ([place] Rings);  Surgeon: Norleen Edsel GAILS, MD;  Location: [facility];  Service: Gynecology;  Laterality: Bilateral;   REPAIR VAGINAL CUFF N/A 07/30/2014   Procedure: REPAIR VAGINAL CUFF;  Surgeon: Winton Felt, MD;  Location: [facility];  Service: Gynecology;  Laterality: N/A;   SAVORY DILATION  06/20/2012   Dr. Sharla gastritis/Ulcer in the mid jejunum. Empiric dilation.    SURAL NERVE BX Left 02/25/2016   Procedure: LEFT SURAL NERVE BIOPSY;  Surgeon: Lamar Peaches, MD;  Location: [facility];  Service: Neurosurgery;  Laterality: Left;  Left sural nerve biopsy    TONSILLECTOMY     TONSILLECTOMY AND ADENOIDECTOMY     UPPER GASTROINTESTINAL ENDOSCOPY  JULY 2011 NAUSEA-D125,V6, PH 25   Bx; GASTRITIS, POUCH-5 CM LONG   WISDOM TOOTH EXTRACTION      Family History  Problem Relation Age of Onset   Breast cancer Mother 50 - 53   Cancer Mother        breast   Hemochromatosis Mother    Depression Mother    Anxiety disorder Mother    Hypertension Father    Diabetes Father    Coronary artery disease Father    Healthy Daughter    Hemochromatosis Maternal Grandmother    Migraines Maternal Grandmother    Cancer Maternal Grandmother    Breast cancer Maternal Grandmother    Migraines Paternal Grandmother    Breast cancer Paternal Grandmother    Coronary artery disease Paternal Grandfather    Anxiety disorder Brother    Bipolar disorder Brother    Healthy Son      Social Connections: Moderately Isolated (11/16/2023)   Social Connection and Isolation Panel    Frequency of Communication with Friends and Family: More than three times a week    Frequency of Social Gatherings with Friends and Family: Never    Attends Religious Services: More than 4 times per year    Active Member of [employer] or Organizations: No    Attends Engineer, Structural: Not on file    Marital Status: Divorced     Current Medications[1]   Physical Exam:   BP 115/76 (BP Location: Right Arm, Patient Position: Sitting, Cuff Size: Normal)   Pulse 92   Wt 167 lb (75.8 kg)   SpO2 99%   BMI 26.95 kg/m   Salient findings:  CN II-XII intact Given history and complaints, ear microscopy was indicated and performed for evaluation with findings as below in physical exam section and in procedures;  Bilateral EAC clear and TM intact with well pneumatized middle ear spaces; mild excoriation b/l cartilaginous EAC likely from prior trauma; no lesions or active bleeding Weber 512: mid [place]in[place] 512: AC > BC b/l  B/l TMJ crepitus Anterior rhinoscopy: Septum intact; bilateral inferior  turbinates without significant hypertrophy No lesions of oral cavity/oropharynx; dentition fair No obviously palpable neck masses/lymphadenopathy/thyromegaly No respiratory distress or stridor  Seprately Identifiable Procedures:  Prior to initiating any procedures, risks/benefits/alternatives were explained to the patient and verbal consent obtained. Procedure: Bilateral ear microscopy using microscope (CPT [id number]) Pre-procedure diagnosis: bilateral ear discomfort Post-procedure diagnosis: same Indication: see above; given patient's otologic complaints and history, for improved and comprehensive examination of external ear and tympanic membrane, bilateral otologic examination using microscope was performed. Prior to proceeding, verbal consent was obtained after discussion of R/B/A  Procedure: Patient  was placed semi-recumbent. Both ear canals were examined using the microscope with findings above. Patient tolerated the procedure well.   Impression & Plans:  Vaida Kerchner is a 39 y.o. female with:  1. Otalgia of both ears   2. Chronic eczematous otitis externa of both ears   3. Bilateral tinnitus   4. Subjective hearing loss    Otalgia - we discussed DDX which is wide but most likely suspect referred from TMJ v/s migraines v/s musculoskeletal; Suspect bleeding is from excoriation from trauma; Does have some itching and eczematoid OE changes -- will treat HL: will get baseline audio  - Cortisporin BID x2 weeks; Betamethasone  BID x2 weeks - Discussed CT to rule out occult pathology but decl  See below regarding exact medications prescribed this encounter including dosages and route: Meds ordered this encounter  Medications   neomycin -polymyxin-hydrocortisone (CORTISPORIN) OTIC solution    Sig: Place 4 drops into both ears in the morning and at bedtime for 14 days.    Dispense:  10 mL    Refill:  0   betamethasone  dipropionate 0.05 % cream    Sig: Apply topically 2 (two) times daily.     Dispense:  30 g    Refill:  0      Thank you for allowing me the opportunity to care for your patient. Please do not hesitate to contact me should you have any other questions.  Sincerely, Eldora Blanch, MD Otolaryngologist (ENT), [facility] Phone: [phone] Fax: [phone]  12/25/2024, 12:53 PM   MDM:  [phone] Complexity/Problems addressed: mod - multiple chronic issues Data complexity: mod - independent review of note, labs, independent CT interpretation - Morbidity: mod  - Prescription Drug prescribed or managed: y      [1]  Current Outpatient Medications:    ADDERALL  XR 30 MG 24 hr capsule, TAKE 1 CAPSULE BY MOUTH ONCE EVERY MORNING., Disp: 30 capsule, Rfl: 0   amitriptyline  (ELAVIL ) 25 MG tablet, Take 25 mg by mouth at bedtime., Disp: , Rfl:    azelastine (ASTELIN) 0.1 % nasal spray, spray 2 spray by intranasal route 2 times every day in each nostril, Disp: , Rfl:    betamethasone  dipropionate 0.05 % cream, Apply topically 2 (two) times daily., Disp: 30 g, Rfl: 0   budesonide -formoterol  (SYMBICORT ) 160-4.5 MCG/ACT inhaler, Inhale into the lungs., Disp: , Rfl:    Calcium  Carb-Cholecalciferol  (CALCIUM -VITAMIN D3) 600-500 MG-UNIT CAPS, Take 1 tablet by mouth daily., Disp: , Rfl:    cetirizine  (ZYRTEC ) 10 MG tablet, TAKE 1 TABLET BY MOUTH ONCE DAILY, Disp: 30 tablet, Rfl: 0   clonazePAM  (KLONOPIN ) 0.5 MG tablet, TAKE 1 TABLET BY MOUTH THREE TIMES DAILY AS NEEDED., Disp: 90 tablet, Rfl: 0   cloNIDine  (CATAPRES ) 0.2 MG tablet, Take 0.2 mg by mouth at bedtime., Disp: , Rfl:    Cyanocobalamin  (B-12 COMPLIANCE INJECTION IJ), Inject as directed., Disp: , Rfl:    cyanocobalamin  (VITAMIN B12) 1000 MCG/ML injection, 1 ml weekly X 4, then 1 ml every other week and then transition to monthly injection., Disp: 1 mL, Rfl: 10   cyanocobalamin  (VITAMIN B12) 1000 MCG/ML injection, Inject 1 mL (1,000 mcg total) into the muscle every 30 (thirty) days., Disp: 1 mL, Rfl:  10   Erenumab -aooe (AIMOVIG ) 140 MG/ML SOAJ, INJECT 1 PEN INTO THE SKIN ONCE EVERY 28 DAYS., Disp: 1 mL, Rfl: 0   Ergocalciferol  (VITAMIN D2 PO), Take by mouth., Disp: , Rfl:    fluconazole  (DIFLUCAN ) 150 MG tablet, Take 1 tablet  PO once. Repeat in 3 days if needed., Disp: 2 tablet, Rfl: 0   ibuprofen  (ADVIL ) 800 MG tablet, Take 800 mg by mouth every 8 (eight) hours as needed., Disp: , Rfl:    ipratropium (ATROVENT ) 0.03 % nasal spray, Place 2 sprays into both nostrils every 12 (twelve) hours., Disp: 30 mL, Rfl: 0   JOURNAVX 50 MG TABS, TAKE 2 TABLETS BY MOUTH FOR FIRST DAY THEN 1 TABLET EVERY 12 HOURS BEGINNING 12 HOURS AFTER LOADING DOSE, Disp: , Rfl:    levalbuterol  (XOPENEX  HFA) 45 MCG/ACT inhaler, INHALE (2) PUFFS INTO THE LUNGS EVERY SIX HOURS AS NEEDED FOR WHEEZING., Disp: 15 g, Rfl: 3   lidocaine  (XYLOCAINE ) 2 % solution, Use as directed 15 mLs in the mouth or throat as needed for mouth pain., Disp: 100 mL, Rfl: 0   mupirocin  ointment (BACTROBAN ) 2 %, Apply thin amount bid to ear canal left for 5 days, Disp: 22 g, Rfl: 0   neomycin -polymyxin-hydrocortisone (CORTISPORIN) OTIC solution, Place 4 drops into both ears in the morning and at bedtime for 14 days., Disp: 10 mL, Rfl: 0   nitrofurantoin , macrocrystal-monohydrate, (MACROBID ) 100 MG capsule, Take 1 capsule (100 mg total) by mouth 2 (two) times daily., Disp: 14 capsule, Rfl: 0   ondansetron  (ZOFRAN -ODT) 8 MG disintegrating tablet, Take 1 tablet (8 mg total) by mouth every 8 (eight) hours as needed., Disp: 20 tablet, Rfl: 6   pantoprazole  (PROTONIX ) 40 MG tablet, TAKE ONE TABLET BY MOUTH ONCE DAILY., Disp: 30 tablet, Rfl: 1   propranolol  (INDERAL ) 10 MG tablet, Take 10 mg by mouth 3 (three) times daily., Disp: , Rfl:    risperiDONE (RISPERDAL) 0.25 MG tablet, Take 0.75 mg by mouth at bedtime., Disp: , Rfl:    rizatriptan  (MAXALT ) 5 MG tablet, Take 1 tablet (5 mg total) by mouth as needed for migraine. May repeat in 2 hours if needed, Disp:  10 tablet, Rfl: 5   sucralfate  (CARAFATE ) 1 g tablet, 1 g tid crush mix with 30 ml water  and take po tid, Disp: 90 tablet, Rfl: 0   SYRINGE-NEEDLE, DISP, 3 ML (B-D SYRINGE/NEEDLE 3CC/25GX5/8) 25G X 5/8 3 ML MISC, 1 each by Does not apply route once a week., Disp: 10 each, Rfl: 0   tiZANidine  (ZANAFLEX ) 2 MG tablet, TAKE ONE TABLET BY MOUTH THREE TIMES DAILY, Disp: 21 tablet, Rfl: 0   tiZANidine  (ZANAFLEX ) 4 MG tablet, Take 1 tablet (4 mg total) by mouth every 8 (eight) hours as needed for muscle spasms., Disp: 90 tablet, Rfl: 4   topiramate  (TOPAMAX ) 25 MG tablet, Take 1 tablet (25 mg total) by mouth daily., Disp: 30 tablet, Rfl: 5   valACYclovir  (VALTREX ) 1000 MG tablet, Take 1 bid for 10 days, Disp: 20 tablet, Rfl: PRN   Vitamin D , Ergocalciferol , (DRISDOL) 1.25 MG (50000 UNIT) CAPS capsule, Take 50,000 Units by mouth every Wednesday., Disp: , Rfl:    vortioxetine  HBr (TRINTELLIX ) 10 MG TABS tablet, Take 10 mg by mouth daily., Disp: , Rfl:

## 2024-12-25 NOTE — Patient Instructions (Signed)
 Use cortisporin ear drops 4 drops twice daily in each ear canal for 2 weeks; right after, use the betamethasone  steroid cream just outside ear canal twice daily for 2 weeks

## 2024-12-27 ENCOUNTER — Telehealth: Payer: Self-pay | Admitting: Pharmacy Technician

## 2024-12-27 ENCOUNTER — Encounter: Payer: Self-pay | Admitting: Physician Assistant

## 2024-12-27 ENCOUNTER — Other Ambulatory Visit (HOSPITAL_COMMUNITY): Payer: Self-pay

## 2024-12-27 ENCOUNTER — Telehealth: Payer: Self-pay

## 2024-12-27 ENCOUNTER — Other Ambulatory Visit: Payer: Self-pay | Admitting: Neurology

## 2024-12-27 NOTE — Telephone Encounter (Signed)
 PA has been submitted, and telephone encounter has been created. Please see telephone encounter dated 1.22.26.

## 2024-12-27 NOTE — Telephone Encounter (Signed)
 Pharmacy Patient Advocate Encounter   Received notification from Pt Calls Messages that prior authorization for AIMOVIG  140MG  is required/requested.   Insurance verification completed.   The patient is insured through [employer].   Per test claim: PA required; PA submitted to above mentioned insurance via Latent Key/confirmation #/EOC [id number] Status is pending

## 2025-01-07 ENCOUNTER — Other Ambulatory Visit (HOSPITAL_COMMUNITY): Payer: Self-pay

## 2025-01-07 NOTE — Telephone Encounter (Signed)
 Pharmacy Patient Advocate Encounter  Received notification from [facility] that Prior Authorization for AIMOVIG  140MG  has been APPROVED from 1.23.26 to 1.21.27   PA #/Case ID/Reference #: [id number]

## 2025-01-11 ENCOUNTER — Encounter: Payer: Self-pay | Admitting: Physician Assistant

## 2025-01-18 ENCOUNTER — Ambulatory Visit: Payer: Self-pay | Admitting: Allergy & Immunology

## 2025-01-21 ENCOUNTER — Ambulatory Visit: Payer: Self-pay | Admitting: Allergy & Immunology

## 2025-02-19 ENCOUNTER — Ambulatory Visit (INDEPENDENT_AMBULATORY_CARE_PROVIDER_SITE_OTHER): Payer: MEDICAID | Admitting: Audiology

## 2025-02-19 ENCOUNTER — Ambulatory Visit (INDEPENDENT_AMBULATORY_CARE_PROVIDER_SITE_OTHER): Payer: MEDICAID | Admitting: Otolaryngology

## 2025-04-12 ENCOUNTER — Inpatient Hospital Stay: Payer: MEDICAID

## 2025-04-19 ENCOUNTER — Inpatient Hospital Stay: Payer: MEDICAID | Admitting: Oncology
